# Patient Record
Sex: Female | Born: 1945
Health system: Southern US, Community
[De-identification: ages and names within clinical notes are randomized; demographics above are authoritative.]

## PROBLEM LIST (undated history)

## (undated) DIAGNOSIS — R351 Nocturia: Secondary | ICD-10-CM

## (undated) DIAGNOSIS — E119 Type 2 diabetes mellitus without complications: Secondary | ICD-10-CM

## (undated) DIAGNOSIS — I509 Heart failure, unspecified: Secondary | ICD-10-CM

## (undated) DIAGNOSIS — R51 Headache: Secondary | ICD-10-CM

## (undated) DIAGNOSIS — I83813 Varicose veins of bilateral lower extremities with pain: Secondary | ICD-10-CM

## (undated) DIAGNOSIS — R233 Spontaneous ecchymoses: Secondary | ICD-10-CM

## (undated) DIAGNOSIS — R238 Other skin changes: Secondary | ICD-10-CM

## (undated) DIAGNOSIS — U071 COVID-19: Secondary | ICD-10-CM

## (undated) DIAGNOSIS — E662 Morbid (severe) obesity with alveolar hypoventilation: Secondary | ICD-10-CM

## (undated) DIAGNOSIS — R945 Abnormal results of liver function studies: Secondary | ICD-10-CM

## (undated) DIAGNOSIS — J449 Chronic obstructive pulmonary disease, unspecified: Secondary | ICD-10-CM

## (undated) DIAGNOSIS — M1990 Unspecified osteoarthritis, unspecified site: Secondary | ICD-10-CM

## (undated) DIAGNOSIS — K859 Acute pancreatitis without necrosis or infection, unspecified: Secondary | ICD-10-CM

## (undated) DIAGNOSIS — E785 Hyperlipidemia, unspecified: Secondary | ICD-10-CM

## (undated) DIAGNOSIS — G8929 Other chronic pain: Secondary | ICD-10-CM

## (undated) DIAGNOSIS — Z860100 Personal history of colon polyps, unspecified: Secondary | ICD-10-CM

## (undated) DIAGNOSIS — N189 Chronic kidney disease, unspecified: Secondary | ICD-10-CM

## (undated) DIAGNOSIS — M549 Dorsalgia, unspecified: Secondary | ICD-10-CM

## (undated) DIAGNOSIS — Z9289 Personal history of other medical treatment: Secondary | ICD-10-CM

## (undated) DIAGNOSIS — G4733 Obstructive sleep apnea (adult) (pediatric): Secondary | ICD-10-CM

## (undated) DIAGNOSIS — J189 Pneumonia, unspecified organism: Secondary | ICD-10-CM

## (undated) DIAGNOSIS — K5790 Diverticulosis of intestine, part unspecified, without perforation or abscess without bleeding: Secondary | ICD-10-CM

## (undated) DIAGNOSIS — G20A1 Parkinson's disease without dyskinesia, without mention of fluctuations: Secondary | ICD-10-CM

## (undated) DIAGNOSIS — R569 Unspecified convulsions: Secondary | ICD-10-CM

## (undated) DIAGNOSIS — R3915 Urgency of urination: Secondary | ICD-10-CM

## (undated) DIAGNOSIS — G629 Polyneuropathy, unspecified: Secondary | ICD-10-CM

## (undated) DIAGNOSIS — E236 Other disorders of pituitary gland: Secondary | ICD-10-CM

## (undated) DIAGNOSIS — R053 Chronic cough: Secondary | ICD-10-CM

## (undated) DIAGNOSIS — F32A Depression, unspecified: Secondary | ICD-10-CM

## (undated) DIAGNOSIS — R05 Cough: Secondary | ICD-10-CM

## (undated) DIAGNOSIS — M2540 Effusion, unspecified joint: Secondary | ICD-10-CM

## (undated) DIAGNOSIS — F329 Major depressive disorder, single episode, unspecified: Secondary | ICD-10-CM

## (undated) DIAGNOSIS — I219 Acute myocardial infarction, unspecified: Secondary | ICD-10-CM

## (undated) DIAGNOSIS — H269 Unspecified cataract: Secondary | ICD-10-CM

## (undated) DIAGNOSIS — H409 Unspecified glaucoma: Secondary | ICD-10-CM

## (undated) DIAGNOSIS — I10 Essential (primary) hypertension: Secondary | ICD-10-CM

## (undated) DIAGNOSIS — D649 Anemia, unspecified: Secondary | ICD-10-CM

## (undated) DIAGNOSIS — G20 Parkinson's disease: Secondary | ICD-10-CM

## (undated) HISTORY — PX: ABDOMINAL HYSTERECTOMY: SHX81

## (undated) HISTORY — DX: Cough: R05

## (undated) HISTORY — DX: Hyperlipidemia, unspecified: E78.5

## (undated) HISTORY — DX: Abnormal results of liver function studies: R94.5

## (undated) HISTORY — DX: Varicose veins of bilateral lower extremities with pain: I83.813

## (undated) HISTORY — PX: APPENDECTOMY: SHX54

## (undated) HISTORY — DX: Chronic cough: R05.3

## (undated) HISTORY — PX: TONSILLECTOMY: SUR1361

## (undated) HISTORY — DX: Essential (primary) hypertension: I10

## (undated) HISTORY — PX: BACK SURGERY: SHX140

## (undated) HISTORY — PX: TUBAL LIGATION: SHX77

## (undated) HISTORY — DX: Headache: R51

## (undated) HISTORY — DX: Other disorders of pituitary gland: E23.6

## (undated) HISTORY — DX: Acute myocardial infarction, unspecified: I21.9

## (undated) HISTORY — PX: NM MYOVIEW LTD: HXRAD82

## (undated) HISTORY — DX: Morbid (severe) obesity with alveolar hypoventilation: E66.2

## (undated) HISTORY — PX: OTHER SURGICAL HISTORY: SHX169

## (undated) HISTORY — PX: RECTAL POLYPECTOMY: SHX2309

## (undated) HISTORY — PX: ESOPHAGOGASTRODUODENOSCOPY: SHX1529

## (undated) HISTORY — DX: Pneumonia, unspecified organism: J18.9

## (undated) HISTORY — DX: Type 2 diabetes mellitus without complications: E11.9

## (undated) HISTORY — PX: TRANSTHORACIC ECHOCARDIOGRAM: SHX275

## (undated) HISTORY — PX: LEFT HEART CATH AND CORONARY ANGIOGRAPHY: CATH118249

## (undated) HISTORY — DX: Obstructive sleep apnea (adult) (pediatric): G47.33

## (undated) HISTORY — PX: CHOLECYSTECTOMY: SHX55

## (undated) HISTORY — PX: CARDIAC CATHETERIZATION: SHX172

## (undated) HISTORY — PX: LASIK: SHX215

---

## 1998-06-23 ENCOUNTER — Ambulatory Visit (HOSPITAL_COMMUNITY): Admission: RE | Admit: 1998-06-23 | Discharge: 1998-06-23 | Payer: Self-pay | Admitting: Nephrology

## 1998-06-23 ENCOUNTER — Encounter: Payer: Self-pay | Admitting: Nephrology

## 1998-08-05 ENCOUNTER — Encounter: Payer: Self-pay | Admitting: Emergency Medicine

## 1998-08-05 ENCOUNTER — Emergency Department (HOSPITAL_COMMUNITY): Admission: EM | Admit: 1998-08-05 | Discharge: 1998-08-05 | Payer: Self-pay | Admitting: Emergency Medicine

## 1998-09-12 ENCOUNTER — Encounter: Admission: RE | Admit: 1998-09-12 | Discharge: 1998-10-18 | Payer: Self-pay | Admitting: *Deleted

## 1999-05-08 ENCOUNTER — Encounter: Admission: RE | Admit: 1999-05-08 | Discharge: 1999-05-08 | Payer: Self-pay | Admitting: Orthopedic Surgery

## 1999-05-08 ENCOUNTER — Encounter: Payer: Self-pay | Admitting: Orthopedic Surgery

## 1999-08-15 ENCOUNTER — Encounter: Admission: RE | Admit: 1999-08-15 | Discharge: 1999-10-12 | Payer: Self-pay | Admitting: *Deleted

## 1999-09-14 ENCOUNTER — Encounter: Admission: RE | Admit: 1999-09-14 | Discharge: 1999-09-14 | Payer: Self-pay | Admitting: Nephrology

## 1999-09-14 ENCOUNTER — Encounter: Payer: Self-pay | Admitting: Nephrology

## 1999-09-15 ENCOUNTER — Encounter: Payer: Self-pay | Admitting: Nephrology

## 1999-09-15 ENCOUNTER — Encounter: Admission: RE | Admit: 1999-09-15 | Discharge: 1999-09-15 | Payer: Self-pay | Admitting: Nephrology

## 1999-09-21 ENCOUNTER — Encounter: Payer: Self-pay | Admitting: Nephrology

## 1999-09-21 ENCOUNTER — Encounter: Admission: RE | Admit: 1999-09-21 | Discharge: 1999-09-21 | Payer: Self-pay | Admitting: Nephrology

## 1999-11-24 ENCOUNTER — Emergency Department (HOSPITAL_COMMUNITY): Admission: EM | Admit: 1999-11-24 | Discharge: 1999-11-24 | Payer: Self-pay | Admitting: Emergency Medicine

## 1999-11-24 ENCOUNTER — Encounter: Payer: Self-pay | Admitting: Emergency Medicine

## 2000-11-08 ENCOUNTER — Encounter: Admission: RE | Admit: 2000-11-08 | Discharge: 2000-11-08 | Payer: Self-pay | Admitting: General Surgery

## 2000-11-08 ENCOUNTER — Encounter (HOSPITAL_BASED_OUTPATIENT_CLINIC_OR_DEPARTMENT_OTHER): Payer: Self-pay | Admitting: General Surgery

## 2001-06-16 ENCOUNTER — Other Ambulatory Visit: Admission: RE | Admit: 2001-06-16 | Discharge: 2001-06-16 | Payer: Self-pay | Admitting: Nephrology

## 2002-09-14 ENCOUNTER — Encounter (HOSPITAL_BASED_OUTPATIENT_CLINIC_OR_DEPARTMENT_OTHER): Payer: Self-pay | Admitting: General Surgery

## 2002-09-14 ENCOUNTER — Ambulatory Visit (HOSPITAL_COMMUNITY): Admission: RE | Admit: 2002-09-14 | Discharge: 2002-09-14 | Payer: Self-pay | Admitting: General Surgery

## 2002-10-23 ENCOUNTER — Encounter: Admission: RE | Admit: 2002-10-23 | Discharge: 2002-10-23 | Payer: Self-pay | Admitting: Nephrology

## 2002-10-23 ENCOUNTER — Encounter: Payer: Self-pay | Admitting: Nephrology

## 2003-05-16 ENCOUNTER — Emergency Department (HOSPITAL_COMMUNITY): Admission: EM | Admit: 2003-05-16 | Discharge: 2003-05-16 | Payer: Self-pay

## 2004-07-09 ENCOUNTER — Emergency Department (HOSPITAL_COMMUNITY): Admission: EM | Admit: 2004-07-09 | Discharge: 2004-07-09 | Payer: Self-pay | Admitting: Emergency Medicine

## 2006-02-18 ENCOUNTER — Inpatient Hospital Stay (HOSPITAL_COMMUNITY): Admission: EM | Admit: 2006-02-18 | Discharge: 2006-02-20 | Payer: Self-pay | Admitting: Emergency Medicine

## 2007-07-09 ENCOUNTER — Observation Stay (HOSPITAL_COMMUNITY): Admission: EM | Admit: 2007-07-09 | Discharge: 2007-07-11 | Payer: Self-pay | Admitting: Emergency Medicine

## 2007-09-22 ENCOUNTER — Emergency Department (HOSPITAL_COMMUNITY): Admission: EM | Admit: 2007-09-22 | Discharge: 2007-09-22 | Payer: Self-pay | Admitting: Emergency Medicine

## 2007-11-27 ENCOUNTER — Inpatient Hospital Stay (HOSPITAL_COMMUNITY): Admission: EM | Admit: 2007-11-27 | Discharge: 2007-11-29 | Payer: Self-pay | Admitting: Emergency Medicine

## 2008-02-20 ENCOUNTER — Emergency Department (HOSPITAL_COMMUNITY): Admission: EM | Admit: 2008-02-20 | Discharge: 2008-02-20 | Payer: Self-pay | Admitting: Emergency Medicine

## 2008-04-02 ENCOUNTER — Emergency Department (HOSPITAL_COMMUNITY): Admission: EM | Admit: 2008-04-02 | Discharge: 2008-04-03 | Payer: Self-pay | Admitting: Emergency Medicine

## 2008-04-20 ENCOUNTER — Emergency Department (HOSPITAL_COMMUNITY): Admission: EM | Admit: 2008-04-20 | Discharge: 2008-04-20 | Payer: Self-pay | Admitting: Emergency Medicine

## 2008-05-04 ENCOUNTER — Inpatient Hospital Stay (HOSPITAL_COMMUNITY): Admission: EM | Admit: 2008-05-04 | Discharge: 2008-05-12 | Payer: Self-pay | Admitting: Emergency Medicine

## 2008-05-04 DIAGNOSIS — R110 Nausea: Secondary | ICD-10-CM | POA: Insufficient documentation

## 2008-05-04 DIAGNOSIS — R42 Dizziness and giddiness: Secondary | ICD-10-CM | POA: Insufficient documentation

## 2008-05-04 DIAGNOSIS — R413 Other amnesia: Secondary | ICD-10-CM | POA: Insufficient documentation

## 2008-07-11 ENCOUNTER — Emergency Department (HOSPITAL_COMMUNITY): Admission: EM | Admit: 2008-07-11 | Discharge: 2008-07-12 | Payer: Self-pay | Admitting: Emergency Medicine

## 2008-12-25 ENCOUNTER — Emergency Department (HOSPITAL_COMMUNITY): Admission: EM | Admit: 2008-12-25 | Discharge: 2008-12-26 | Payer: Self-pay | Admitting: Emergency Medicine

## 2009-03-04 ENCOUNTER — Emergency Department (HOSPITAL_COMMUNITY): Admission: EM | Admit: 2009-03-04 | Discharge: 2009-03-04 | Payer: Self-pay | Admitting: Emergency Medicine

## 2009-04-23 ENCOUNTER — Emergency Department (HOSPITAL_COMMUNITY): Admission: EM | Admit: 2009-04-23 | Discharge: 2009-04-23 | Payer: Self-pay | Admitting: Emergency Medicine

## 2009-05-16 ENCOUNTER — Ambulatory Visit (HOSPITAL_COMMUNITY): Admission: RE | Admit: 2009-05-16 | Discharge: 2009-05-16 | Payer: Self-pay | Admitting: Family Medicine

## 2009-05-20 ENCOUNTER — Inpatient Hospital Stay (HOSPITAL_COMMUNITY): Admission: EM | Admit: 2009-05-20 | Discharge: 2009-05-24 | Payer: Self-pay | Admitting: Emergency Medicine

## 2009-05-21 ENCOUNTER — Encounter: Payer: Self-pay | Admitting: Internal Medicine

## 2009-05-21 ENCOUNTER — Ambulatory Visit: Payer: Self-pay | Admitting: Internal Medicine

## 2009-05-23 ENCOUNTER — Encounter (INDEPENDENT_AMBULATORY_CARE_PROVIDER_SITE_OTHER): Payer: Self-pay | Admitting: Neurology

## 2009-05-23 ENCOUNTER — Ambulatory Visit: Payer: Self-pay | Admitting: Vascular Surgery

## 2009-05-24 ENCOUNTER — Ambulatory Visit: Payer: Self-pay | Admitting: Infectious Diseases

## 2009-06-30 ENCOUNTER — Inpatient Hospital Stay (HOSPITAL_COMMUNITY): Admission: EM | Admit: 2009-06-30 | Discharge: 2009-07-05 | Payer: Self-pay | Admitting: Emergency Medicine

## 2009-09-29 ENCOUNTER — Encounter: Payer: Self-pay | Admitting: Cardiovascular Disease

## 2009-12-21 ENCOUNTER — Encounter: Payer: Self-pay | Admitting: Infectious Diseases

## 2009-12-28 ENCOUNTER — Emergency Department (HOSPITAL_COMMUNITY): Admission: EM | Admit: 2009-12-28 | Discharge: 2009-12-29 | Payer: Self-pay | Admitting: Emergency Medicine

## 2010-06-25 DIAGNOSIS — J189 Pneumonia, unspecified organism: Secondary | ICD-10-CM

## 2010-06-25 DIAGNOSIS — I219 Acute myocardial infarction, unspecified: Secondary | ICD-10-CM

## 2010-06-25 HISTORY — DX: Acute myocardial infarction, unspecified: I21.9

## 2010-06-25 HISTORY — DX: Pneumonia, unspecified organism: J18.9

## 2010-07-27 NOTE — Miscellaneous (Signed)
Summary: [facility]: CMN  [facility]: CMN   Imported By: Florinda Marker 12/21/2009 16:52:48  _____________________________________________________________________  External Attachment:    Type:   Image     Comment:   External Document

## 2010-08-13 ENCOUNTER — Emergency Department (HOSPITAL_COMMUNITY): Payer: Medicare HMO

## 2010-08-13 ENCOUNTER — Inpatient Hospital Stay (HOSPITAL_COMMUNITY)
Admission: EM | Admit: 2010-08-13 | Discharge: 2010-09-08 | DRG: 417 | Disposition: A | Discharge status: Home Health | Payer: Medicare HMO | Attending: Internal Medicine | Admitting: Internal Medicine

## 2010-08-13 DIAGNOSIS — I129 Hypertensive chronic kidney disease with stage 1 through stage 4 chronic kidney disease, or unspecified chronic kidney disease: Secondary | ICD-10-CM | POA: Diagnosis present

## 2010-08-13 DIAGNOSIS — T380X5A Adverse effect of glucocorticoids and synthetic analogues, initial encounter: Secondary | ICD-10-CM | POA: Diagnosis present

## 2010-08-13 DIAGNOSIS — G8929 Other chronic pain: Secondary | ICD-10-CM | POA: Diagnosis present

## 2010-08-13 DIAGNOSIS — G4733 Obstructive sleep apnea (adult) (pediatric): Secondary | ICD-10-CM | POA: Diagnosis present

## 2010-08-13 DIAGNOSIS — N182 Chronic kidney disease, stage 2 (mild): Secondary | ICD-10-CM | POA: Diagnosis present

## 2010-08-13 DIAGNOSIS — E119 Type 2 diabetes mellitus without complications: Secondary | ICD-10-CM | POA: Diagnosis present

## 2010-08-13 DIAGNOSIS — Z7982 Long term (current) use of aspirin: Secondary | ICD-10-CM

## 2010-08-13 DIAGNOSIS — I509 Heart failure, unspecified: Secondary | ICD-10-CM | POA: Diagnosis not present

## 2010-08-13 DIAGNOSIS — F3289 Other specified depressive episodes: Secondary | ICD-10-CM | POA: Diagnosis present

## 2010-08-13 DIAGNOSIS — Z79899 Other long term (current) drug therapy: Secondary | ICD-10-CM

## 2010-08-13 DIAGNOSIS — K859 Acute pancreatitis without necrosis or infection, unspecified: Principal | ICD-10-CM | POA: Diagnosis present

## 2010-08-13 DIAGNOSIS — Z880 Allergy status to penicillin: Secondary | ICD-10-CM

## 2010-08-13 DIAGNOSIS — G40909 Epilepsy, unspecified, not intractable, without status epilepticus: Secondary | ICD-10-CM | POA: Diagnosis present

## 2010-08-13 DIAGNOSIS — J189 Pneumonia, unspecified organism: Secondary | ICD-10-CM | POA: Diagnosis not present

## 2010-08-13 DIAGNOSIS — K8050 Calculus of bile duct without cholangitis or cholecystitis without obstruction: Secondary | ICD-10-CM | POA: Diagnosis present

## 2010-08-13 DIAGNOSIS — K5730 Diverticulosis of large intestine without perforation or abscess without bleeding: Secondary | ICD-10-CM | POA: Diagnosis present

## 2010-08-13 DIAGNOSIS — I214 Non-ST elevation (NSTEMI) myocardial infarction: Secondary | ICD-10-CM | POA: Diagnosis present

## 2010-08-13 DIAGNOSIS — J9620 Acute and chronic respiratory failure, unspecified whether with hypoxia or hypercapnia: Secondary | ICD-10-CM | POA: Diagnosis not present

## 2010-08-13 DIAGNOSIS — E785 Hyperlipidemia, unspecified: Secondary | ICD-10-CM | POA: Diagnosis present

## 2010-08-13 DIAGNOSIS — I5033 Acute on chronic diastolic (congestive) heart failure: Secondary | ICD-10-CM | POA: Diagnosis not present

## 2010-08-13 DIAGNOSIS — F329 Major depressive disorder, single episode, unspecified: Secondary | ICD-10-CM | POA: Diagnosis present

## 2010-08-13 DIAGNOSIS — D72829 Elevated white blood cell count, unspecified: Secondary | ICD-10-CM | POA: Diagnosis present

## 2010-08-13 DIAGNOSIS — K648 Other hemorrhoids: Secondary | ICD-10-CM | POA: Diagnosis not present

## 2010-08-13 DIAGNOSIS — D649 Anemia, unspecified: Secondary | ICD-10-CM | POA: Diagnosis not present

## 2010-08-13 DIAGNOSIS — M549 Dorsalgia, unspecified: Secondary | ICD-10-CM | POA: Diagnosis present

## 2010-08-13 DIAGNOSIS — Z8249 Family history of ischemic heart disease and other diseases of the circulatory system: Secondary | ICD-10-CM

## 2010-08-13 LAB — CBC
HCT: 47.9 % — ABNORMAL HIGH (ref 36.0–46.0)
Hemoglobin: 15.2 g/dL — ABNORMAL HIGH (ref 12.0–15.0)
MCH: 23.5 pg — ABNORMAL LOW (ref 26.0–34.0)
MCHC: 31.7 g/dL (ref 30.0–36.0)
MCV: 73.9 fL — ABNORMAL LOW (ref 78.0–100.0)
Platelets: 252 10*3/uL (ref 150–400)
RBC: 6.48 MIL/uL — ABNORMAL HIGH (ref 3.87–5.11)
RDW: 16.6 % — ABNORMAL HIGH (ref 11.5–15.5)
WBC: 13.9 10*3/uL — ABNORMAL HIGH (ref 4.0–10.5)

## 2010-08-13 LAB — CARDIAC PANEL(CRET KIN+CKTOT+MB+TROPI)
CK, MB: 17.1 ng/mL (ref 0.3–4.0)
CK, MB: 19.1 ng/mL (ref 0.3–4.0)
Relative Index: 7.8 — ABNORMAL HIGH (ref 0.0–2.5)
Relative Index: 7.7 — ABNORMAL HIGH (ref 0.0–2.5)
Total CK: 220 U/L — ABNORMAL HIGH (ref 7–177)
Total CK: 247 U/L — ABNORMAL HIGH (ref 7–177)
Troponin I: 0.7 ng/mL (ref 0.00–0.06)
Troponin I: 0.78 ng/mL (ref 0.00–0.06)

## 2010-08-13 LAB — COMPREHENSIVE METABOLIC PANEL
ALT: 308 U/L — ABNORMAL HIGH (ref 0–35)
AST: 534 U/L — ABNORMAL HIGH (ref 0–37)
Albumin: 4.1 g/dL (ref 3.5–5.2)
Alkaline Phosphatase: 204 U/L — ABNORMAL HIGH (ref 39–117)
BUN: 14 mg/dL (ref 6–23)
CO2: 30 mEq/L (ref 19–32)
Calcium: 9.5 mg/dL (ref 8.4–10.5)
Chloride: 100 mEq/L (ref 96–112)
Creatinine, Ser: 1.1 mg/dL (ref 0.4–1.2)
GFR calc Af Amer: >60 mL/min (ref >60)
GFR calc non Af Amer: 50 mL/min — ABNORMAL LOW (ref >60)
Glucose, Bld: 266 mg/dL — ABNORMAL HIGH (ref 70–99)
Potassium: 4.4 mEq/L (ref 3.5–5.1)
Sodium: 139 mEq/L (ref 135–145)
Total Bilirubin: 1.4 mg/dL — ABNORMAL HIGH (ref 0.3–1.2)
Total Protein: 8.7 g/dL — ABNORMAL HIGH (ref 6.0–8.3)

## 2010-08-13 LAB — IRON AND TIBC
Iron: 21 ug/dL — ABNORMAL LOW (ref 42–135)
Saturation Ratios: 7 % — ABNORMAL LOW (ref 20–55)
TIBC: 315 ug/dL (ref 250–470)
UIBC: 294 ug/dL

## 2010-08-13 LAB — URINALYSIS, ROUTINE W REFLEX MICROSCOPIC
Hgb urine dipstick: NEGATIVE
Ketones, ur: NEGATIVE mg/dL
Nitrite: NEGATIVE
Protein, ur: NEGATIVE mg/dL
Specific Gravity, Urine: 1.013 (ref 1.005–1.030)
Urine Glucose, Fasting: NEGATIVE mg/dL
Urobilinogen, UA: 1 mg/dL (ref 0.0–1.0)
pH: 6 (ref 5.0–8.0)

## 2010-08-13 LAB — ACETAMINOPHEN LEVEL: Acetaminophen (Tylenol), Serum: <10 ug/mL — ABNORMAL LOW (ref 10–30)

## 2010-08-13 LAB — ALPHA-1-ANTITRYPSIN: A-1 Antitrypsin, Ser: 210 mg/dL — ABNORMAL HIGH (ref 83–200)

## 2010-08-13 LAB — CK TOTAL AND CKMB (NOT AT ARMC)
CK, MB: 11.8 ng/mL (ref 0.3–4.0)
Relative Index: 6.8 — ABNORMAL HIGH (ref 0.0–2.5)
Total CK: 174 U/L (ref 7–177)

## 2010-08-13 LAB — GLUCOSE, CAPILLARY
Glucose-Capillary: 253 mg/dL — ABNORMAL HIGH (ref 70–99)
Glucose-Capillary: 219 mg/dL — ABNORMAL HIGH (ref 70–99)
Glucose-Capillary: 168 mg/dL — ABNORMAL HIGH (ref 70–99)

## 2010-08-13 LAB — DIFFERENTIAL
Basophils Absolute: 0 10*3/uL (ref 0.0–0.1)
Basophils Relative: 0 % (ref 0–1)
Eosinophils Absolute: 0 10*3/uL (ref 0.0–0.7)
Eosinophils Relative: 0 % (ref 0–5)
Lymphocytes Relative: 6 % — ABNORMAL LOW (ref 12–46)
Lymphs Abs: 0.8 10*3/uL (ref 0.7–4.0)
Monocytes Absolute: 0.4 10*3/uL (ref 0.1–1.0)
Monocytes Relative: 3 % (ref 3–12)
Neutro Abs: 12.7 10*3/uL — ABNORMAL HIGH (ref 1.7–7.7)
Neutrophils Relative %: 92 % — ABNORMAL HIGH (ref 43–77)

## 2010-08-13 LAB — SALICYLATE LEVEL: Salicylate Lvl: <4 mg/dL (ref 2.8–20.0)

## 2010-08-13 LAB — APTT: aPTT: 33 seconds (ref 24–37)

## 2010-08-13 LAB — HEPATITIS PANEL, ACUTE
HCV Ab: NEGATIVE
Hep A IgM: NEGATIVE
Hep B C IgM: NEGATIVE
Hepatitis B Surface Ag: NEGATIVE

## 2010-08-13 LAB — CERULOPLASMIN: Ceruloplasmin: 60 mg/dL (ref 21–63)

## 2010-08-13 LAB — CORTISOL: Cortisol, Plasma: 76 ug/dL

## 2010-08-13 LAB — PROTIME-INR
INR: 1.12 (ref 0.00–1.49)
Prothrombin Time: 14.6 seconds (ref 11.6–15.2)

## 2010-08-13 LAB — LIPASE, BLOOD: Lipase: 1995 U/L — ABNORMAL HIGH (ref 11–59)

## 2010-08-13 LAB — TROPONIN I: Troponin I: 0.22 ng/mL — ABNORMAL HIGH (ref 0.00–0.06)

## 2010-08-13 LAB — TSH: TSH: 0.768 u[IU]/mL (ref 0.350–4.500)

## 2010-08-13 LAB — FERRITIN: Ferritin: 366 ng/mL — ABNORMAL HIGH (ref 10–291)

## 2010-08-13 MED ORDER — IOHEXOL 300 MG/ML  SOLN
125.0000 mL | Freq: Once | INTRAMUSCULAR | Status: AC | PRN
  Administered 2010-08-13: 125 mL via INTRAVENOUS

## 2010-08-14 ENCOUNTER — Inpatient Hospital Stay (HOSPITAL_COMMUNITY): Payer: Medicare HMO

## 2010-08-14 DIAGNOSIS — R945 Abnormal results of liver function studies: Secondary | ICD-10-CM

## 2010-08-14 DIAGNOSIS — I369 Nonrheumatic tricuspid valve disorder, unspecified: Secondary | ICD-10-CM

## 2010-08-14 DIAGNOSIS — I214 Non-ST elevation (NSTEMI) myocardial infarction: Secondary | ICD-10-CM

## 2010-08-14 DIAGNOSIS — K859 Acute pancreatitis without necrosis or infection, unspecified: Secondary | ICD-10-CM

## 2010-08-14 DIAGNOSIS — K8020 Calculus of gallbladder without cholecystitis without obstruction: Secondary | ICD-10-CM

## 2010-08-14 LAB — CBC
HCT: 42.2 % (ref 36.0–46.0)
Hemoglobin: 12.9 g/dL (ref 12.0–15.0)
MCH: 22.7 pg — ABNORMAL LOW (ref 26.0–34.0)
MCHC: 30.6 g/dL (ref 30.0–36.0)
MCV: 74.3 fL — ABNORMAL LOW (ref 78.0–100.0)
Platelets: 229 10*3/uL (ref 150–400)
RBC: 5.68 MIL/uL — ABNORMAL HIGH (ref 3.87–5.11)
RDW: 16.8 % — ABNORMAL HIGH (ref 11.5–15.5)
WBC: 19.6 10*3/uL — ABNORMAL HIGH (ref 4.0–10.5)

## 2010-08-14 LAB — BLOOD GAS, ARTERIAL
Acid-base deficit: 0 mmol/L (ref 0.0–2.0)
Acid-base deficit: 0.4 mmol/L (ref 0.0–2.0)
Bicarbonate: 27.1 mEq/L — ABNORMAL HIGH (ref 20.0–24.0)
Bicarbonate: 26.7 mEq/L — ABNORMAL HIGH (ref 20.0–24.0)
Drawn by: 30996
Drawn by: 103701
Expiratory PAP: 5
FIO2: 0.6 %
Inspiratory PAP: 13
Mode: POSITIVE
O2 Content: 4 L/min
O2 Saturation: 91.3 %
O2 Saturation: 96.9 %
Patient temperature: 98.6
Patient temperature: 99.8
RATE: 8 resp/min
TCO2: 25.2 mmol/L (ref 0–100)
TCO2: 24.8 mmol/L (ref 0–100)
pCO2 arterial: 58.8 mmHg (ref 35.0–45.0)
pCO2 arterial: 60.1 mmHg (ref 35.0–45.0)
pH, Arterial: 7.286 — ABNORMAL LOW (ref 7.350–7.400)
pH, Arterial: 7.275 — ABNORMAL LOW (ref 7.350–7.400)
pO2, Arterial: 64.1 mmHg — ABNORMAL LOW (ref 80.0–100.0)
pO2, Arterial: 92 mmHg (ref 80.0–100.0)

## 2010-08-14 LAB — DIFFERENTIAL
Basophils Absolute: 0 10*3/uL (ref 0.0–0.1)
Basophils Relative: 0 % (ref 0–1)
Eosinophils Absolute: 0 10*3/uL (ref 0.0–0.7)
Eosinophils Relative: 0 % (ref 0–5)
Lymphocytes Relative: 4 % — ABNORMAL LOW (ref 12–46)
Lymphs Abs: 0.8 10*3/uL (ref 0.7–4.0)
Monocytes Absolute: 1 10*3/uL (ref 0.1–1.0)
Monocytes Relative: 5 % (ref 3–12)
Neutro Abs: 17.8 10*3/uL — ABNORMAL HIGH (ref 1.7–7.7)
Neutrophils Relative %: 91 % — ABNORMAL HIGH (ref 43–77)

## 2010-08-14 LAB — GLUCOSE, CAPILLARY
Glucose-Capillary: 157 mg/dL — ABNORMAL HIGH (ref 70–99)
Glucose-Capillary: 161 mg/dL — ABNORMAL HIGH (ref 70–99)
Glucose-Capillary: 178 mg/dL — ABNORMAL HIGH (ref 70–99)
Glucose-Capillary: 181 mg/dL — ABNORMAL HIGH (ref 70–99)
Glucose-Capillary: 216 mg/dL — ABNORMAL HIGH (ref 70–99)
Glucose-Capillary: 217 mg/dL — ABNORMAL HIGH (ref 70–99)

## 2010-08-14 LAB — LIPASE, BLOOD: Lipase: 1101 U/L — ABNORMAL HIGH (ref 11–59)

## 2010-08-14 LAB — HEMOGLOBIN AND HEMATOCRIT, BLOOD
HCT: 41.2 % (ref 36.0–46.0)
HCT: 39 % (ref 36.0–46.0)
HCT: 38.2 % (ref 36.0–46.0)
Hemoglobin: 12.4 g/dL (ref 12.0–15.0)
Hemoglobin: 12 g/dL (ref 12.0–15.0)
Hemoglobin: 11.6 g/dL — ABNORMAL LOW (ref 12.0–15.0)

## 2010-08-14 LAB — CARDIAC PANEL(CRET KIN+CKTOT+MB+TROPI)
CK, MB: 18.7 ng/mL (ref 0.3–4.0)
Relative Index: 6.5 — ABNORMAL HIGH (ref 0.0–2.5)
Total CK: 287 U/L — ABNORMAL HIGH (ref 7–177)
Troponin I: 0.47 ng/mL — ABNORMAL HIGH (ref 0.00–0.06)

## 2010-08-14 LAB — COMPREHENSIVE METABOLIC PANEL
ALT: 171 U/L — ABNORMAL HIGH (ref 0–35)
AST: 128 U/L — ABNORMAL HIGH (ref 0–37)
Albumin: 3.5 g/dL (ref 3.5–5.2)
Alkaline Phosphatase: 133 U/L — ABNORMAL HIGH (ref 39–117)
BUN: 13 mg/dL (ref 6–23)
CO2: 24 mEq/L (ref 19–32)
Calcium: 8 mg/dL — ABNORMAL LOW (ref 8.4–10.5)
Chloride: 105 mEq/L (ref 96–112)
Creatinine, Ser: 1.05 mg/dL (ref 0.4–1.2)
GFR calc Af Amer: >60 mL/min (ref >60)
GFR calc non Af Amer: 53 mL/min — ABNORMAL LOW (ref >60)
Glucose, Bld: 194 mg/dL — ABNORMAL HIGH (ref 70–99)
Potassium: 4.9 mEq/L (ref 3.5–5.1)
Sodium: 136 mEq/L (ref 135–145)
Total Bilirubin: 0.7 mg/dL (ref 0.3–1.2)
Total Protein: 7.4 g/dL (ref 6.0–8.3)

## 2010-08-14 LAB — BRAIN NATRIURETIC PEPTIDE: Pro B Natriuretic peptide (BNP): 161 pg/mL — ABNORMAL HIGH (ref 0.0–100.0)

## 2010-08-14 LAB — HEPARIN LEVEL (UNFRACTIONATED)
Heparin Unfractionated: 0.21 IU/mL — ABNORMAL LOW (ref 0.30–0.70)
Heparin Unfractionated: 0.28 IU/mL — ABNORMAL LOW (ref 0.30–0.70)

## 2010-08-14 LAB — MRSA PCR SCREENING: MRSA by PCR: NEGATIVE

## 2010-08-14 LAB — ANA: Anti Nuclear Antibody(ANA): NEGATIVE

## 2010-08-15 ENCOUNTER — Inpatient Hospital Stay (HOSPITAL_COMMUNITY): Payer: Medicare HMO

## 2010-08-15 DIAGNOSIS — R945 Abnormal results of liver function studies: Secondary | ICD-10-CM

## 2010-08-15 DIAGNOSIS — K8020 Calculus of gallbladder without cholecystitis without obstruction: Secondary | ICD-10-CM

## 2010-08-15 DIAGNOSIS — K859 Acute pancreatitis without necrosis or infection, unspecified: Secondary | ICD-10-CM

## 2010-08-15 LAB — BLOOD GAS, ARTERIAL
Acid-Base Excess: 1.1 mmol/L (ref 0.0–2.0)
Acid-Base Excess: 0.5 mmol/L (ref 0.0–2.0)
Bicarbonate: 27.9 mEq/L — ABNORMAL HIGH (ref 20.0–24.0)
Bicarbonate: 26.9 mEq/L — ABNORMAL HIGH (ref 20.0–24.0)
Drawn by: 129801
Drawn by: 317871
O2 Content: 4 L/min
O2 Content: 4 L/min
O2 Saturation: 94.5 %
O2 Saturation: 90.9 %
Patient temperature: 98.6
Patient temperature: 98.6
TCO2: 26.2 mmol/L (ref 0–100)
TCO2: 25.3 mmol/L (ref 0–100)
pCO2 arterial: 58.5 mmHg (ref 35.0–45.0)
pCO2 arterial: 55.4 mmHg — ABNORMAL HIGH (ref 35.0–45.0)
pH, Arterial: 7.3 — ABNORMAL LOW (ref 7.350–7.400)
pH, Arterial: 7.307 — ABNORMAL LOW (ref 7.350–7.400)
pO2, Arterial: 74.7 mmHg — ABNORMAL LOW (ref 80.0–100.0)
pO2, Arterial: 62.2 mmHg — ABNORMAL LOW (ref 80.0–100.0)

## 2010-08-15 LAB — COMPREHENSIVE METABOLIC PANEL
ALT: 105 U/L — ABNORMAL HIGH (ref 0–35)
AST: 64 U/L — ABNORMAL HIGH (ref 0–37)
Albumin: 2.9 g/dL — ABNORMAL LOW (ref 3.5–5.2)
Alkaline Phosphatase: 97 U/L (ref 39–117)
BUN: 15 mg/dL (ref 6–23)
CO2: 27 mEq/L (ref 19–32)
Calcium: 7.4 mg/dL — ABNORMAL LOW (ref 8.4–10.5)
Chloride: 107 mEq/L (ref 96–112)
Creatinine, Ser: 1.1 mg/dL (ref 0.4–1.2)
GFR calc Af Amer: >60 mL/min (ref >60)
GFR calc non Af Amer: 50 mL/min — ABNORMAL LOW (ref >60)
Glucose, Bld: 199 mg/dL — ABNORMAL HIGH (ref 70–99)
Potassium: 4.7 mEq/L (ref 3.5–5.1)
Sodium: 139 mEq/L (ref 135–145)
Total Bilirubin: 0.8 mg/dL (ref 0.3–1.2)
Total Protein: 6.4 g/dL (ref 6.0–8.3)

## 2010-08-15 LAB — GLUCOSE, CAPILLARY
Glucose-Capillary: 204 mg/dL — ABNORMAL HIGH (ref 70–99)
Glucose-Capillary: 217 mg/dL — ABNORMAL HIGH (ref 70–99)
Glucose-Capillary: 165 mg/dL — ABNORMAL HIGH (ref 70–99)
Glucose-Capillary: 167 mg/dL — ABNORMAL HIGH (ref 70–99)
Glucose-Capillary: 147 mg/dL — ABNORMAL HIGH (ref 70–99)
Glucose-Capillary: 165 mg/dL — ABNORMAL HIGH (ref 70–99)

## 2010-08-15 LAB — CBC
HCT: 37.3 % (ref 36.0–46.0)
Hemoglobin: 11.5 g/dL — ABNORMAL LOW (ref 12.0–15.0)
MCH: 23.3 pg — ABNORMAL LOW (ref 26.0–34.0)
MCHC: 30.8 g/dL (ref 30.0–36.0)
MCV: 75.7 fL — ABNORMAL LOW (ref 78.0–100.0)
Platelets: 211 10*3/uL (ref 150–400)
RBC: 4.93 MIL/uL (ref 3.87–5.11)
RDW: 17.2 % — ABNORMAL HIGH (ref 11.5–15.5)
WBC: 18.7 10*3/uL — ABNORMAL HIGH (ref 4.0–10.5)

## 2010-08-15 LAB — MITOCHONDRIAL ANTIBODIES: Mitochondrial M2 Ab, IgG: 0.49 (ref <0.91)

## 2010-08-16 ENCOUNTER — Inpatient Hospital Stay (HOSPITAL_COMMUNITY): Payer: Medicare HMO

## 2010-08-16 DIAGNOSIS — K8020 Calculus of gallbladder without cholecystitis without obstruction: Secondary | ICD-10-CM

## 2010-08-16 DIAGNOSIS — J9600 Acute respiratory failure, unspecified whether with hypoxia or hypercapnia: Secondary | ICD-10-CM

## 2010-08-16 DIAGNOSIS — R945 Abnormal results of liver function studies: Secondary | ICD-10-CM

## 2010-08-16 DIAGNOSIS — K859 Acute pancreatitis without necrosis or infection, unspecified: Secondary | ICD-10-CM

## 2010-08-16 LAB — CBC
HCT: 34.7 % — ABNORMAL LOW (ref 36.0–46.0)
Hemoglobin: 10.5 g/dL — ABNORMAL LOW (ref 12.0–15.0)
MCH: 22.8 pg — ABNORMAL LOW (ref 26.0–34.0)
MCHC: 30.3 g/dL (ref 30.0–36.0)
MCV: 75.4 fL — ABNORMAL LOW (ref 78.0–100.0)
Platelets: 232 10*3/uL (ref 150–400)
RBC: 4.6 MIL/uL (ref 3.87–5.11)
RDW: 17.2 % — ABNORMAL HIGH (ref 11.5–15.5)
WBC: 15.9 10*3/uL — ABNORMAL HIGH (ref 4.0–10.5)

## 2010-08-16 LAB — GLUCOSE, CAPILLARY
Glucose-Capillary: 197 mg/dL — ABNORMAL HIGH (ref 70–99)
Glucose-Capillary: 211 mg/dL — ABNORMAL HIGH (ref 70–99)
Glucose-Capillary: 177 mg/dL — ABNORMAL HIGH (ref 70–99)
Glucose-Capillary: 171 mg/dL — ABNORMAL HIGH (ref 70–99)
Glucose-Capillary: 189 mg/dL — ABNORMAL HIGH (ref 70–99)

## 2010-08-16 LAB — BLOOD GAS, ARTERIAL
Acid-Base Excess: 2.5 mmol/L — ABNORMAL HIGH (ref 0.0–2.0)
Acid-Base Excess: 6 mmol/L — ABNORMAL HIGH (ref 0.0–2.0)
Bicarbonate: 28.6 mEq/L — ABNORMAL HIGH (ref 20.0–24.0)
Bicarbonate: 33.2 mEq/L — ABNORMAL HIGH (ref 20.0–24.0)
Drawn by: 295031
Drawn by: 257701
Expiratory PAP: 5
FIO2: 0.35 %
FIO2: 0.4 %
Inspiratory PAP: 8
Mode: POSITIVE
O2 Saturation: 94.6 %
O2 Saturation: 92 %
PEEP: 5 cmH2O
Patient temperature: 98.6
Patient temperature: 98.6
Pressure support: 8 cmH2O
RATE: 26 resp/min
TCO2: 26.6 mmol/L (ref 0–100)
TCO2: 30.5 mmol/L (ref 0–100)
pCO2 arterial: 54.7 mmHg — ABNORMAL HIGH (ref 35.0–45.0)
pCO2 arterial: 63.9 mmHg (ref 35.0–45.0)
pH, Arterial: 7.339 — ABNORMAL LOW (ref 7.350–7.400)
pH, Arterial: 7.335 — ABNORMAL LOW (ref 7.350–7.400)
pO2, Arterial: 71.3 mmHg — ABNORMAL LOW (ref 80.0–100.0)
pO2, Arterial: 68.3 mmHg — ABNORMAL LOW (ref 80.0–100.0)

## 2010-08-16 LAB — LIPASE, BLOOD: Lipase: 53 U/L (ref 11–59)

## 2010-08-16 LAB — COMPREHENSIVE METABOLIC PANEL
ALT: 77 U/L — ABNORMAL HIGH (ref 0–35)
AST: 31 U/L (ref 0–37)
Albumin: 2.8 g/dL — ABNORMAL LOW (ref 3.5–5.2)
Alkaline Phosphatase: 83 U/L (ref 39–117)
BUN: 11 mg/dL (ref 6–23)
CO2: 29 mEq/L (ref 19–32)
Calcium: 7.5 mg/dL — ABNORMAL LOW (ref 8.4–10.5)
Chloride: 106 mEq/L (ref 96–112)
Creatinine, Ser: 1.03 mg/dL (ref 0.4–1.2)
GFR calc Af Amer: >60 mL/min (ref >60)
GFR calc non Af Amer: 54 mL/min — ABNORMAL LOW (ref >60)
Glucose, Bld: 205 mg/dL — ABNORMAL HIGH (ref 70–99)
Potassium: 3.8 mEq/L (ref 3.5–5.1)
Sodium: 141 mEq/L (ref 135–145)
Total Bilirubin: 1 mg/dL (ref 0.3–1.2)
Total Protein: 6.5 g/dL (ref 6.0–8.3)

## 2010-08-17 ENCOUNTER — Inpatient Hospital Stay (HOSPITAL_COMMUNITY): Payer: Medicare HMO

## 2010-08-17 DIAGNOSIS — K8020 Calculus of gallbladder without cholecystitis without obstruction: Secondary | ICD-10-CM

## 2010-08-17 DIAGNOSIS — K859 Acute pancreatitis without necrosis or infection, unspecified: Secondary | ICD-10-CM

## 2010-08-17 DIAGNOSIS — R945 Abnormal results of liver function studies: Secondary | ICD-10-CM

## 2010-08-17 LAB — GLUCOSE, CAPILLARY
Glucose-Capillary: 262 mg/dL — ABNORMAL HIGH (ref 70–99)
Glucose-Capillary: 264 mg/dL — ABNORMAL HIGH (ref 70–99)
Glucose-Capillary: 282 mg/dL — ABNORMAL HIGH (ref 70–99)
Glucose-Capillary: 291 mg/dL — ABNORMAL HIGH (ref 70–99)
Glucose-Capillary: 325 mg/dL — ABNORMAL HIGH (ref 70–99)
Glucose-Capillary: 399 mg/dL — ABNORMAL HIGH (ref 70–99)

## 2010-08-17 LAB — CBC
HCT: 33.9 % — ABNORMAL LOW (ref 36.0–46.0)
Hemoglobin: 10.1 g/dL — ABNORMAL LOW (ref 12.0–15.0)
MCH: 22.2 pg — ABNORMAL LOW (ref 26.0–34.0)
MCHC: 29.8 g/dL — ABNORMAL LOW (ref 30.0–36.0)
MCV: 74.7 fL — ABNORMAL LOW (ref 78.0–100.0)
Platelets: 249 10*3/uL (ref 150–400)
RBC: 4.54 MIL/uL (ref 3.87–5.11)
RDW: 16.8 % — ABNORMAL HIGH (ref 11.5–15.5)
WBC: 16.5 10*3/uL — ABNORMAL HIGH (ref 4.0–10.5)

## 2010-08-17 LAB — BLOOD GAS, ARTERIAL
Acid-Base Excess: 5.9 mmol/L — ABNORMAL HIGH (ref 0.0–2.0)
Acid-Base Excess: 7.1 mmol/L — ABNORMAL HIGH (ref 0.0–2.0)
Acid-Base Excess: 8.2 mmol/L — ABNORMAL HIGH (ref 0.0–2.0)
Bicarbonate: 32.8 mEq/L — ABNORMAL HIGH (ref 20.0–24.0)
Bicarbonate: 34.3 mEq/L — ABNORMAL HIGH (ref 20.0–24.0)
Bicarbonate: 34.6 mEq/L — ABNORMAL HIGH (ref 20.0–24.0)
Delivery systems: POSITIVE
Delivery systems: POSITIVE
Drawn by: 340271
Drawn by: 129801
Expiratory PAP: 5
Expiratory PAP: 5
FIO2: 40 %
FIO2: 0.4 %
FIO2: 0.5 %
Inspiratory PAP: 13
Inspiratory PAP: 13
Mode: POSITIVE
Mode: POSITIVE
O2 Saturation: 94.4 %
O2 Saturation: 91.9 %
O2 Saturation: 89.7 %
PEEP: 5 cmH2O
Patient temperature: 98.6
Patient temperature: 98.6
Patient temperature: 98.6
Pressure control: 18 cmH2O
RATE: 12 resp/min
TCO2: 29.2 mmol/L (ref 0–100)
TCO2: 31.4 mmol/L (ref 0–100)
TCO2: 32.1 mmol/L (ref 0–100)
pCO2 arterial: 59.3 mmHg (ref 35.0–45.0)
pCO2 arterial: 64.6 mmHg (ref 35.0–45.0)
pCO2 arterial: 60.9 mmHg (ref 35.0–45.0)
pH, Arterial: 7.361 (ref 7.350–7.400)
pH, Arterial: 7.344 — ABNORMAL LOW (ref 7.350–7.400)
pH, Arterial: 7.373 (ref 7.350–7.400)
pO2, Arterial: 71.6 mmHg — ABNORMAL LOW (ref 80.0–100.0)
pO2, Arterial: 63.4 mmHg — ABNORMAL LOW (ref 80.0–100.0)
pO2, Arterial: 55.9 mmHg — ABNORMAL LOW (ref 80.0–100.0)

## 2010-08-17 LAB — MAGNESIUM: Magnesium: 2.5 mg/dL (ref 1.5–2.5)

## 2010-08-17 LAB — DIFFERENTIAL
Basophils Absolute: 0 10*3/uL (ref 0.0–0.1)
Basophils Relative: 0 % (ref 0–1)
Eosinophils Absolute: 0 10*3/uL (ref 0.0–0.7)
Eosinophils Relative: 0 % (ref 0–5)
Lymphocytes Relative: 4 % — ABNORMAL LOW (ref 12–46)
Lymphs Abs: 0.7 10*3/uL (ref 0.7–4.0)
Monocytes Absolute: 0.7 10*3/uL (ref 0.1–1.0)
Monocytes Relative: 4 % (ref 3–12)
Neutro Abs: 15.1 10*3/uL — ABNORMAL HIGH (ref 1.7–7.7)
Neutrophils Relative %: 92 % — ABNORMAL HIGH (ref 43–77)

## 2010-08-17 LAB — URINE MICROSCOPIC-ADD ON

## 2010-08-17 LAB — COMPREHENSIVE METABOLIC PANEL
ALT: 55 U/L — ABNORMAL HIGH (ref 0–35)
AST: 20 U/L (ref 0–37)
Albumin: 2.6 g/dL — ABNORMAL LOW (ref 3.5–5.2)
Alkaline Phosphatase: 91 U/L (ref 39–117)
BUN: 11 mg/dL (ref 6–23)
CO2: 33 mEq/L — ABNORMAL HIGH (ref 19–32)
Calcium: 7.9 mg/dL — ABNORMAL LOW (ref 8.4–10.5)
Chloride: 101 mEq/L (ref 96–112)
Creatinine, Ser: 0.87 mg/dL (ref 0.4–1.2)
GFR calc Af Amer: >60 mL/min (ref >60)
GFR calc non Af Amer: >60 mL/min (ref >60)
Glucose, Bld: 286 mg/dL — ABNORMAL HIGH (ref 70–99)
Potassium: 4.3 mEq/L (ref 3.5–5.1)
Sodium: 140 mEq/L (ref 135–145)
Total Bilirubin: 0.6 mg/dL (ref 0.3–1.2)
Total Protein: 6.4 g/dL (ref 6.0–8.3)

## 2010-08-17 LAB — URINALYSIS, ROUTINE W REFLEX MICROSCOPIC
Bilirubin Urine: NEGATIVE
Ketones, ur: 15 mg/dL — AB
Nitrite: NEGATIVE
Protein, ur: 30 mg/dL — AB
Specific Gravity, Urine: 1.026 (ref 1.005–1.030)
Urine Glucose, Fasting: 500 mg/dL — AB
Urobilinogen, UA: 1 mg/dL (ref 0.0–1.0)
pH: 6.5 (ref 5.0–8.0)

## 2010-08-17 LAB — AMMONIA: Ammonia: 28 umol/L (ref 11–35)

## 2010-08-17 LAB — PHOSPHORUS: Phosphorus: 1.8 mg/dL — ABNORMAL LOW (ref 2.3–4.6)

## 2010-08-17 LAB — PREALBUMIN: Prealbumin: 7 mg/dL — ABNORMAL LOW (ref 17.0–34.0)

## 2010-08-17 LAB — PROCALCITONIN: Procalcitonin: 0.95 ng/mL

## 2010-08-17 LAB — CHOLESTEROL, TOTAL: Cholesterol: 176 mg/dL (ref 0–200)

## 2010-08-17 LAB — TRIGLYCERIDES: Triglycerides: 96 mg/dL (ref <150)

## 2010-08-18 ENCOUNTER — Inpatient Hospital Stay (HOSPITAL_COMMUNITY): Payer: Medicare HMO

## 2010-08-18 LAB — CBC
HCT: 33.8 % — ABNORMAL LOW (ref 36.0–46.0)
HCT: 34.5 % — ABNORMAL LOW (ref 36.0–46.0)
Hemoglobin: 10 g/dL — ABNORMAL LOW (ref 12.0–15.0)
Hemoglobin: 10.1 g/dL — ABNORMAL LOW (ref 12.0–15.0)
MCH: 22.2 pg — ABNORMAL LOW (ref 26.0–34.0)
MCH: 22.4 pg — ABNORMAL LOW (ref 26.0–34.0)
MCHC: 29.3 g/dL — ABNORMAL LOW (ref 30.0–36.0)
MCHC: 29.6 g/dL — ABNORMAL LOW (ref 30.0–36.0)
MCV: 75.6 fL — ABNORMAL LOW (ref 78.0–100.0)
MCV: 75.8 fL — ABNORMAL LOW (ref 78.0–100.0)
Platelets: 264 10*3/uL (ref 150–400)
Platelets: 271 10*3/uL (ref 150–400)
RBC: 4.47 MIL/uL (ref 3.87–5.11)
RBC: 4.55 MIL/uL (ref 3.87–5.11)
RDW: 16.9 % — ABNORMAL HIGH (ref 11.5–15.5)
RDW: 16.9 % — ABNORMAL HIGH (ref 11.5–15.5)
WBC: 14.2 10*3/uL — ABNORMAL HIGH (ref 4.0–10.5)
WBC: 14.3 10*3/uL — ABNORMAL HIGH (ref 4.0–10.5)

## 2010-08-18 LAB — URINE CULTURE
Colony Count: NO GROWTH
Culture  Setup Time: 201202231317
Culture: NO GROWTH
Special Requests: NEGATIVE

## 2010-08-18 LAB — GLUCOSE, CAPILLARY
Glucose-Capillary: 237 mg/dL — ABNORMAL HIGH (ref 70–99)
Glucose-Capillary: 250 mg/dL — ABNORMAL HIGH (ref 70–99)
Glucose-Capillary: 291 mg/dL — ABNORMAL HIGH (ref 70–99)
Glucose-Capillary: 273 mg/dL — ABNORMAL HIGH (ref 70–99)
Glucose-Capillary: 174 mg/dL — ABNORMAL HIGH (ref 70–99)
Glucose-Capillary: 236 mg/dL — ABNORMAL HIGH (ref 70–99)

## 2010-08-18 LAB — PHOSPHORUS: Phosphorus: 2 mg/dL — ABNORMAL LOW (ref 2.3–4.6)

## 2010-08-18 LAB — BASIC METABOLIC PANEL
BUN: 15 mg/dL (ref 6–23)
CO2: 37 mEq/L — ABNORMAL HIGH (ref 19–32)
Calcium: 8.2 mg/dL — ABNORMAL LOW (ref 8.4–10.5)
Chloride: 98 mEq/L (ref 96–112)
Creatinine, Ser: 0.85 mg/dL (ref 0.4–1.2)
GFR calc Af Amer: >60 mL/min (ref >60)
GFR calc non Af Amer: >60 mL/min (ref >60)
Glucose, Bld: 268 mg/dL — ABNORMAL HIGH (ref 70–99)
Potassium: 3.9 mEq/L (ref 3.5–5.1)
Sodium: 143 mEq/L (ref 135–145)

## 2010-08-18 LAB — MAGNESIUM: Magnesium: 2.4 mg/dL (ref 1.5–2.5)

## 2010-08-18 LAB — ANTI-SMOOTH MUSCLE ANTIBODY, IGG: F-Actin IgG: <20

## 2010-08-19 ENCOUNTER — Inpatient Hospital Stay (HOSPITAL_COMMUNITY): Payer: Medicare HMO

## 2010-08-19 LAB — COMPREHENSIVE METABOLIC PANEL
ALT: 36 U/L — ABNORMAL HIGH (ref 0–35)
AST: 21 U/L (ref 0–37)
Albumin: 2.3 g/dL — ABNORMAL LOW (ref 3.5–5.2)
Alkaline Phosphatase: 85 U/L (ref 39–117)
BUN: 19 mg/dL (ref 6–23)
CO2: 39 mEq/L — ABNORMAL HIGH (ref 19–32)
Calcium: 8.2 mg/dL — ABNORMAL LOW (ref 8.4–10.5)
Chloride: 92 mEq/L — ABNORMAL LOW (ref 96–112)
Creatinine, Ser: 0.89 mg/dL (ref 0.4–1.2)
GFR calc Af Amer: >60 mL/min (ref >60)
GFR calc non Af Amer: >60 mL/min (ref >60)
Glucose, Bld: 219 mg/dL — ABNORMAL HIGH (ref 70–99)
Potassium: 3.8 mEq/L (ref 3.5–5.1)
Sodium: 140 mEq/L (ref 135–145)
Total Bilirubin: 0.7 mg/dL (ref 0.3–1.2)
Total Protein: 6.1 g/dL (ref 6.0–8.3)

## 2010-08-19 LAB — CBC
HCT: 35.3 % — ABNORMAL LOW (ref 36.0–46.0)
Hemoglobin: 10.6 g/dL — ABNORMAL LOW (ref 12.0–15.0)
MCH: 22.5 pg — ABNORMAL LOW (ref 26.0–34.0)
MCHC: 30 g/dL (ref 30.0–36.0)
MCV: 74.9 fL — ABNORMAL LOW (ref 78.0–100.0)
Platelets: 281 10*3/uL (ref 150–400)
RBC: 4.71 MIL/uL (ref 3.87–5.11)
RDW: 16.4 % — ABNORMAL HIGH (ref 11.5–15.5)
WBC: 14.1 10*3/uL — ABNORMAL HIGH (ref 4.0–10.5)

## 2010-08-19 LAB — GLUCOSE, CAPILLARY
Glucose-Capillary: 236 mg/dL — ABNORMAL HIGH (ref 70–99)
Glucose-Capillary: 250 mg/dL — ABNORMAL HIGH (ref 70–99)
Glucose-Capillary: 195 mg/dL — ABNORMAL HIGH (ref 70–99)
Glucose-Capillary: 216 mg/dL — ABNORMAL HIGH (ref 70–99)
Glucose-Capillary: 287 mg/dL — ABNORMAL HIGH (ref 70–99)
Glucose-Capillary: 314 mg/dL — ABNORMAL HIGH (ref 70–99)
Glucose-Capillary: 177 mg/dL — ABNORMAL HIGH (ref 70–99)

## 2010-08-19 LAB — BLOOD GAS, ARTERIAL
Acid-Base Excess: 12.8 mmol/L — ABNORMAL HIGH (ref 0.0–2.0)
Bicarbonate: 40.2 mEq/L — ABNORMAL HIGH (ref 20.0–24.0)
Drawn by: 340271
FIO2: 0.48 %
O2 Saturation: 89.3 %
Patient temperature: 98.6
TCO2: 35.7 mmol/L (ref 0–100)
pCO2 arterial: 65.6 mmHg (ref 35.0–45.0)
pH, Arterial: 7.405 — ABNORMAL HIGH (ref 7.350–7.400)
pO2, Arterial: 56.8 mmHg — ABNORMAL LOW (ref 80.0–100.0)

## 2010-08-19 LAB — PHOSPHORUS: Phosphorus: 3.1 mg/dL (ref 2.3–4.6)

## 2010-08-19 LAB — MAGNESIUM: Magnesium: 2.1 mg/dL (ref 1.5–2.5)

## 2010-08-20 LAB — BASIC METABOLIC PANEL
BUN: 25 mg/dL — ABNORMAL HIGH (ref 6–23)
CO2: 36 mEq/L — ABNORMAL HIGH (ref 19–32)
Calcium: 8.1 mg/dL — ABNORMAL LOW (ref 8.4–10.5)
Chloride: 92 mEq/L — ABNORMAL LOW (ref 96–112)
Creatinine, Ser: 1.03 mg/dL (ref 0.4–1.2)
GFR calc Af Amer: >60 mL/min (ref >60)
GFR calc non Af Amer: 54 mL/min — ABNORMAL LOW (ref >60)
Glucose, Bld: 187 mg/dL — ABNORMAL HIGH (ref 70–99)
Potassium: 3.7 mEq/L (ref 3.5–5.1)
Sodium: 136 mEq/L (ref 135–145)

## 2010-08-20 LAB — GLUCOSE, CAPILLARY
Glucose-Capillary: 142 mg/dL — ABNORMAL HIGH (ref 70–99)
Glucose-Capillary: 162 mg/dL — ABNORMAL HIGH (ref 70–99)
Glucose-Capillary: 173 mg/dL — ABNORMAL HIGH (ref 70–99)
Glucose-Capillary: 214 mg/dL — ABNORMAL HIGH (ref 70–99)
Glucose-Capillary: 199 mg/dL — ABNORMAL HIGH (ref 70–99)
Glucose-Capillary: 283 mg/dL — ABNORMAL HIGH (ref 70–99)

## 2010-08-20 LAB — CBC
HCT: 33.6 % — ABNORMAL LOW (ref 36.0–46.0)
Hemoglobin: 10.3 g/dL — ABNORMAL LOW (ref 12.0–15.0)
MCH: 22.6 pg — ABNORMAL LOW (ref 26.0–34.0)
MCHC: 30.7 g/dL (ref 30.0–36.0)
MCV: 73.7 fL — ABNORMAL LOW (ref 78.0–100.0)
Platelets: 285 10*3/uL (ref 150–400)
RBC: 4.56 MIL/uL (ref 3.87–5.11)
RDW: 16.1 % — ABNORMAL HIGH (ref 11.5–15.5)
WBC: 14.7 10*3/uL — ABNORMAL HIGH (ref 4.0–10.5)

## 2010-08-21 LAB — COMPREHENSIVE METABOLIC PANEL
ALT: 28 U/L (ref 0–35)
AST: 22 U/L (ref 0–37)
Albumin: 2 g/dL — ABNORMAL LOW (ref 3.5–5.2)
Alkaline Phosphatase: 73 U/L (ref 39–117)
BUN: 32 mg/dL — ABNORMAL HIGH (ref 6–23)
CO2: 32 mEq/L (ref 19–32)
Calcium: 7.8 mg/dL — ABNORMAL LOW (ref 8.4–10.5)
Chloride: 96 mEq/L (ref 96–112)
Creatinine, Ser: 1.28 mg/dL — ABNORMAL HIGH (ref 0.4–1.2)
GFR calc Af Amer: 51 mL/min — ABNORMAL LOW (ref >60)
GFR calc non Af Amer: 42 mL/min — ABNORMAL LOW (ref >60)
Glucose, Bld: 98 mg/dL (ref 70–99)
Potassium: 3.5 mEq/L (ref 3.5–5.1)
Sodium: 136 mEq/L (ref 135–145)
Total Bilirubin: 0.4 mg/dL (ref 0.3–1.2)
Total Protein: 5.7 g/dL — ABNORMAL LOW (ref 6.0–8.3)

## 2010-08-21 LAB — GLUCOSE, CAPILLARY
Glucose-Capillary: 122 mg/dL — ABNORMAL HIGH (ref 70–99)
Glucose-Capillary: 90 mg/dL (ref 70–99)
Glucose-Capillary: 99 mg/dL (ref 70–99)
Glucose-Capillary: 167 mg/dL — ABNORMAL HIGH (ref 70–99)
Glucose-Capillary: 196 mg/dL — ABNORMAL HIGH (ref 70–99)
Glucose-Capillary: 244 mg/dL — ABNORMAL HIGH (ref 70–99)

## 2010-08-21 LAB — CBC
HCT: 30.6 % — ABNORMAL LOW (ref 36.0–46.0)
Hemoglobin: 9.4 g/dL — ABNORMAL LOW (ref 12.0–15.0)
MCH: 22.4 pg — ABNORMAL LOW (ref 26.0–34.0)
MCHC: 30.7 g/dL (ref 30.0–36.0)
MCV: 73 fL — ABNORMAL LOW (ref 78.0–100.0)
Platelets: 302 10*3/uL (ref 150–400)
RBC: 4.19 MIL/uL (ref 3.87–5.11)
RDW: 16.2 % — ABNORMAL HIGH (ref 11.5–15.5)
WBC: 14.4 10*3/uL — ABNORMAL HIGH (ref 4.0–10.5)

## 2010-08-21 LAB — DIFFERENTIAL
Basophils Absolute: 0 10*3/uL (ref 0.0–0.1)
Basophils Relative: 0 % (ref 0–1)
Eosinophils Absolute: 0.3 10*3/uL (ref 0.0–0.7)
Eosinophils Relative: 2 % (ref 0–5)
Lymphocytes Relative: 9 % — ABNORMAL LOW (ref 12–46)
Lymphs Abs: 1.3 10*3/uL (ref 0.7–4.0)
Monocytes Absolute: 0.9 10*3/uL (ref 0.1–1.0)
Monocytes Relative: 6 % (ref 3–12)
Neutro Abs: 11.9 10*3/uL — ABNORMAL HIGH (ref 1.7–7.7)
Neutrophils Relative %: 83 % — ABNORMAL HIGH (ref 43–77)

## 2010-08-21 LAB — TRIGLYCERIDES: Triglycerides: 93 mg/dL (ref <150)

## 2010-08-21 LAB — PHOSPHORUS: Phosphorus: 5.2 mg/dL — ABNORMAL HIGH (ref 2.3–4.6)

## 2010-08-21 LAB — PREALBUMIN: Prealbumin: 10.6 mg/dL — ABNORMAL LOW (ref 17.0–34.0)

## 2010-08-21 LAB — LIPASE, BLOOD: Lipase: 24 U/L (ref 11–59)

## 2010-08-21 LAB — MAGNESIUM: Magnesium: 2 mg/dL (ref 1.5–2.5)

## 2010-08-21 LAB — CHOLESTEROL, TOTAL: Cholesterol: 113 mg/dL (ref 0–200)

## 2010-08-22 LAB — GLUCOSE, CAPILLARY
Glucose-Capillary: 106 mg/dL — ABNORMAL HIGH (ref 70–99)
Glucose-Capillary: 114 mg/dL — ABNORMAL HIGH (ref 70–99)
Glucose-Capillary: 149 mg/dL — ABNORMAL HIGH (ref 70–99)
Glucose-Capillary: 182 mg/dL — ABNORMAL HIGH (ref 70–99)
Glucose-Capillary: 191 mg/dL — ABNORMAL HIGH (ref 70–99)
Glucose-Capillary: 265 mg/dL — ABNORMAL HIGH (ref 70–99)

## 2010-08-22 LAB — BLOOD GAS, ARTERIAL
Acid-Base Excess: 4.4 mmol/L — ABNORMAL HIGH (ref 0.0–2.0)
Bicarbonate: 30.2 mEq/L — ABNORMAL HIGH (ref 20.0–24.0)
Delivery systems: POSITIVE
Drawn by: 308601
O2 Content: 2 L/min
O2 Saturation: 97 %
Patient temperature: 98.6
TCO2: 26.8 mmol/L (ref 0–100)
pCO2 arterial: 52.3 mmHg — ABNORMAL HIGH (ref 35.0–45.0)
pH, Arterial: 7.38 (ref 7.350–7.400)
pO2, Arterial: 88.1 mmHg (ref 80.0–100.0)

## 2010-08-22 LAB — CBC
HCT: 29.9 % — ABNORMAL LOW (ref 36.0–46.0)
Hemoglobin: 9.2 g/dL — ABNORMAL LOW (ref 12.0–15.0)
MCH: 22.4 pg — ABNORMAL LOW (ref 26.0–34.0)
MCHC: 30.8 g/dL (ref 30.0–36.0)
MCV: 72.9 fL — ABNORMAL LOW (ref 78.0–100.0)
Platelets: 303 10*3/uL (ref 150–400)
RBC: 4.1 MIL/uL (ref 3.87–5.11)
RDW: 16 % — ABNORMAL HIGH (ref 11.5–15.5)
WBC: 13 10*3/uL — ABNORMAL HIGH (ref 4.0–10.5)

## 2010-08-22 LAB — COMPREHENSIVE METABOLIC PANEL
ALT: 23 U/L (ref 0–35)
AST: 20 U/L (ref 0–37)
Albumin: 2.1 g/dL — ABNORMAL LOW (ref 3.5–5.2)
Alkaline Phosphatase: 78 U/L (ref 39–117)
BUN: 29 mg/dL — ABNORMAL HIGH (ref 6–23)
CO2: 32 mEq/L (ref 19–32)
Calcium: 8.1 mg/dL — ABNORMAL LOW (ref 8.4–10.5)
Chloride: 96 mEq/L (ref 96–112)
Creatinine, Ser: 1.27 mg/dL — ABNORMAL HIGH (ref 0.4–1.2)
GFR calc Af Amer: 52 mL/min — ABNORMAL LOW (ref >60)
GFR calc non Af Amer: 43 mL/min — ABNORMAL LOW (ref >60)
Glucose, Bld: 103 mg/dL — ABNORMAL HIGH (ref 70–99)
Potassium: 4 mEq/L (ref 3.5–5.1)
Sodium: 134 mEq/L — ABNORMAL LOW (ref 135–145)
Total Bilirubin: 0.4 mg/dL (ref 0.3–1.2)
Total Protein: 5.8 g/dL — ABNORMAL LOW (ref 6.0–8.3)

## 2010-08-22 LAB — BRAIN NATRIURETIC PEPTIDE: Pro B Natriuretic peptide (BNP): 277 pg/mL — ABNORMAL HIGH (ref 0.0–100.0)

## 2010-08-22 LAB — PHOSPHORUS: Phosphorus: 4.1 mg/dL (ref 2.3–4.6)

## 2010-08-23 DIAGNOSIS — I214 Non-ST elevation (NSTEMI) myocardial infarction: Secondary | ICD-10-CM

## 2010-08-23 LAB — GLUCOSE, CAPILLARY
Glucose-Capillary: 163 mg/dL — ABNORMAL HIGH (ref 70–99)
Glucose-Capillary: 244 mg/dL — ABNORMAL HIGH (ref 70–99)
Glucose-Capillary: 234 mg/dL — ABNORMAL HIGH (ref 70–99)
Glucose-Capillary: 266 mg/dL — ABNORMAL HIGH (ref 70–99)

## 2010-08-23 LAB — CBC
HCT: 29.4 % — ABNORMAL LOW (ref 36.0–46.0)
Hemoglobin: 8.9 g/dL — ABNORMAL LOW (ref 12.0–15.0)
MCH: 22.1 pg — ABNORMAL LOW (ref 26.0–34.0)
MCHC: 30.3 g/dL (ref 30.0–36.0)
MCV: 73 fL — ABNORMAL LOW (ref 78.0–100.0)
Platelets: 345 10*3/uL (ref 150–400)
RBC: 4.03 MIL/uL (ref 3.87–5.11)
RDW: 16.2 % — ABNORMAL HIGH (ref 11.5–15.5)
WBC: 11.9 10*3/uL — ABNORMAL HIGH (ref 4.0–10.5)

## 2010-08-24 ENCOUNTER — Ambulatory Visit (HOSPITAL_COMMUNITY): Payer: Medicare HMO

## 2010-08-24 DIAGNOSIS — R0602 Shortness of breath: Secondary | ICD-10-CM | POA: Insufficient documentation

## 2010-08-24 DIAGNOSIS — I10 Essential (primary) hypertension: Secondary | ICD-10-CM | POA: Insufficient documentation

## 2010-08-24 DIAGNOSIS — E785 Hyperlipidemia, unspecified: Secondary | ICD-10-CM | POA: Insufficient documentation

## 2010-08-24 DIAGNOSIS — M549 Dorsalgia, unspecified: Secondary | ICD-10-CM | POA: Insufficient documentation

## 2010-08-24 DIAGNOSIS — R232 Flushing: Secondary | ICD-10-CM | POA: Insufficient documentation

## 2010-08-24 DIAGNOSIS — E119 Type 2 diabetes mellitus without complications: Secondary | ICD-10-CM | POA: Insufficient documentation

## 2010-08-24 DIAGNOSIS — R110 Nausea: Secondary | ICD-10-CM | POA: Insufficient documentation

## 2010-08-24 LAB — GLUCOSE, CAPILLARY
Glucose-Capillary: 198 mg/dL — ABNORMAL HIGH (ref 70–99)
Glucose-Capillary: 251 mg/dL — ABNORMAL HIGH (ref 70–99)
Glucose-Capillary: 181 mg/dL — ABNORMAL HIGH (ref 70–99)
Glucose-Capillary: 204 mg/dL — ABNORMAL HIGH (ref 70–99)

## 2010-08-24 LAB — BASIC METABOLIC PANEL
BUN: 19 mg/dL (ref 6–23)
CO2: 30 mEq/L (ref 19–32)
Calcium: 8.3 mg/dL — ABNORMAL LOW (ref 8.4–10.5)
Chloride: 100 mEq/L (ref 96–112)
Creatinine, Ser: 0.97 mg/dL (ref 0.4–1.2)
GFR calc Af Amer: >60 mL/min (ref >60)
GFR calc non Af Amer: 58 mL/min — ABNORMAL LOW (ref >60)
Glucose, Bld: 209 mg/dL — ABNORMAL HIGH (ref 70–99)
Potassium: 4.3 mEq/L (ref 3.5–5.1)
Sodium: 135 mEq/L (ref 135–145)

## 2010-08-24 LAB — CBC
HCT: 28.9 % — ABNORMAL LOW (ref 36.0–46.0)
Hemoglobin: 9 g/dL — ABNORMAL LOW (ref 12.0–15.0)
MCH: 22.8 pg — ABNORMAL LOW (ref 26.0–34.0)
MCHC: 31.1 g/dL (ref 30.0–36.0)
MCV: 73.2 fL — ABNORMAL LOW (ref 78.0–100.0)
Platelets: 377 10*3/uL (ref 150–400)
RBC: 3.95 MIL/uL (ref 3.87–5.11)
RDW: 16.1 % — ABNORMAL HIGH (ref 11.5–15.5)
WBC: 10.9 10*3/uL — ABNORMAL HIGH (ref 4.0–10.5)

## 2010-08-25 ENCOUNTER — Inpatient Hospital Stay (HOSPITAL_COMMUNITY): Payer: Medicare HMO

## 2010-08-25 DIAGNOSIS — R079 Chest pain, unspecified: Secondary | ICD-10-CM

## 2010-08-25 DIAGNOSIS — M7989 Other specified soft tissue disorders: Secondary | ICD-10-CM

## 2010-08-25 LAB — GLUCOSE, CAPILLARY
Glucose-Capillary: 198 mg/dL — ABNORMAL HIGH (ref 70–99)
Glucose-Capillary: 174 mg/dL — ABNORMAL HIGH (ref 70–99)
Glucose-Capillary: 260 mg/dL — ABNORMAL HIGH (ref 70–99)

## 2010-08-25 LAB — CBC
HCT: 29.2 % — ABNORMAL LOW (ref 36.0–46.0)
Hemoglobin: 8.9 g/dL — ABNORMAL LOW (ref 12.0–15.0)
MCH: 22.3 pg — ABNORMAL LOW (ref 26.0–34.0)
MCHC: 30.5 g/dL (ref 30.0–36.0)
MCV: 73 fL — ABNORMAL LOW (ref 78.0–100.0)
Platelets: 395 10*3/uL (ref 150–400)
RBC: 4 MIL/uL (ref 3.87–5.11)
RDW: 15.9 % — ABNORMAL HIGH (ref 11.5–15.5)
WBC: 11.1 10*3/uL — ABNORMAL HIGH (ref 4.0–10.5)

## 2010-08-25 MED ORDER — TECHNETIUM TC 99M TETROFOSMIN IV KIT: 30.0000 | PACK | Freq: Once | INTRAVENOUS | Status: AC | PRN

## 2010-08-25 MED ORDER — TECHNETIUM TC 99M TETROFOSMIN IV KIT
30.0000 | PACK | Freq: Once | INTRAVENOUS | Status: AC | PRN
  Administered 2010-08-25: 30 via INTRAVENOUS

## 2010-08-26 LAB — BASIC METABOLIC PANEL
BUN: 8 mg/dL (ref 6–23)
CO2: 32 mEq/L (ref 19–32)
Calcium: 8.5 mg/dL (ref 8.4–10.5)
Chloride: 98 mEq/L (ref 96–112)
Creatinine, Ser: 0.84 mg/dL (ref 0.4–1.2)
GFR calc Af Amer: >60 mL/min (ref >60)
GFR calc non Af Amer: >60 mL/min (ref >60)
Glucose, Bld: 223 mg/dL — ABNORMAL HIGH (ref 70–99)
Potassium: 4.3 mEq/L (ref 3.5–5.1)
Sodium: 136 mEq/L (ref 135–145)

## 2010-08-26 LAB — CBC
HCT: 27.9 % — ABNORMAL LOW (ref 36.0–46.0)
Hemoglobin: 8.5 g/dL — ABNORMAL LOW (ref 12.0–15.0)
MCH: 22.3 pg — ABNORMAL LOW (ref 26.0–34.0)
MCHC: 30.5 g/dL (ref 30.0–36.0)
MCV: 73.2 fL — ABNORMAL LOW (ref 78.0–100.0)
Platelets: 435 10*3/uL — ABNORMAL HIGH (ref 150–400)
RBC: 3.81 MIL/uL — ABNORMAL LOW (ref 3.87–5.11)
RDW: 15.8 % — ABNORMAL HIGH (ref 11.5–15.5)
WBC: 9.1 10*3/uL (ref 4.0–10.5)

## 2010-08-26 LAB — GLUCOSE, CAPILLARY
Glucose-Capillary: 173 mg/dL — ABNORMAL HIGH (ref 70–99)
Glucose-Capillary: 202 mg/dL — ABNORMAL HIGH (ref 70–99)
Glucose-Capillary: 249 mg/dL — ABNORMAL HIGH (ref 70–99)
Glucose-Capillary: 233 mg/dL — ABNORMAL HIGH (ref 70–99)

## 2010-08-27 LAB — GLUCOSE, CAPILLARY
Glucose-Capillary: 239 mg/dL — ABNORMAL HIGH (ref 70–99)
Glucose-Capillary: 224 mg/dL — ABNORMAL HIGH (ref 70–99)
Glucose-Capillary: 215 mg/dL — ABNORMAL HIGH (ref 70–99)
Glucose-Capillary: 206 mg/dL — ABNORMAL HIGH (ref 70–99)
Glucose-Capillary: 240 mg/dL — ABNORMAL HIGH (ref 70–99)

## 2010-08-27 LAB — CBC
HCT: 28.5 % — ABNORMAL LOW (ref 36.0–46.0)
Hemoglobin: 8.9 g/dL — ABNORMAL LOW (ref 12.0–15.0)
MCH: 23.1 pg — ABNORMAL LOW (ref 26.0–34.0)
MCHC: 31.2 g/dL (ref 30.0–36.0)
MCV: 73.8 fL — ABNORMAL LOW (ref 78.0–100.0)
Platelets: 471 10*3/uL — ABNORMAL HIGH (ref 150–400)
RBC: 3.86 MIL/uL — ABNORMAL LOW (ref 3.87–5.11)
RDW: 15.7 % — ABNORMAL HIGH (ref 11.5–15.5)
WBC: 8.1 10*3/uL (ref 4.0–10.5)

## 2010-08-27 LAB — BRAIN NATRIURETIC PEPTIDE: Pro B Natriuretic peptide (BNP): 193 pg/mL — ABNORMAL HIGH (ref 0.0–100.0)

## 2010-08-28 DIAGNOSIS — I25118 Atherosclerotic heart disease of native coronary artery with other forms of angina pectoris: Secondary | ICD-10-CM

## 2010-08-28 LAB — GLUCOSE, CAPILLARY
Glucose-Capillary: 217 mg/dL — ABNORMAL HIGH (ref 70–99)
Glucose-Capillary: 276 mg/dL — ABNORMAL HIGH (ref 70–99)
Glucose-Capillary: 150 mg/dL — ABNORMAL HIGH (ref 70–99)
Glucose-Capillary: 219 mg/dL — ABNORMAL HIGH (ref 70–99)

## 2010-08-28 LAB — CBC
HCT: 30.2 % — ABNORMAL LOW (ref 36.0–46.0)
Hemoglobin: 9.1 g/dL — ABNORMAL LOW (ref 12.0–15.0)
MCH: 22 pg — ABNORMAL LOW (ref 26.0–34.0)
MCHC: 30.1 g/dL (ref 30.0–36.0)
MCV: 73.1 fL — ABNORMAL LOW (ref 78.0–100.0)
Platelets: 496 10*3/uL — ABNORMAL HIGH (ref 150–400)
RBC: 4.13 MIL/uL (ref 3.87–5.11)
RDW: 15.8 % — ABNORMAL HIGH (ref 11.5–15.5)
WBC: 6.8 10*3/uL (ref 4.0–10.5)

## 2010-08-28 LAB — BASIC METABOLIC PANEL
BUN: 9 mg/dL (ref 6–23)
CO2: 35 mEq/L — ABNORMAL HIGH (ref 19–32)
Calcium: 8.6 mg/dL (ref 8.4–10.5)
Chloride: 93 mEq/L — ABNORMAL LOW (ref 96–112)
Creatinine, Ser: 0.89 mg/dL (ref 0.4–1.2)
GFR calc Af Amer: >60 mL/min (ref >60)
GFR calc non Af Amer: >60 mL/min (ref >60)
Glucose, Bld: 257 mg/dL — ABNORMAL HIGH (ref 70–99)
Potassium: 3.9 mEq/L (ref 3.5–5.1)
Sodium: 135 mEq/L (ref 135–145)

## 2010-08-29 LAB — BASIC METABOLIC PANEL
BUN: 15 mg/dL (ref 6–23)
CO2: 33 mEq/L — ABNORMAL HIGH (ref 19–32)
Calcium: 9 mg/dL (ref 8.4–10.5)
Chloride: 95 mEq/L — ABNORMAL LOW (ref 96–112)
Creatinine, Ser: 0.99 mg/dL (ref 0.4–1.2)
GFR calc Af Amer: >60 mL/min (ref >60)
GFR calc non Af Amer: 57 mL/min — ABNORMAL LOW (ref >60)
Glucose, Bld: 244 mg/dL — ABNORMAL HIGH (ref 70–99)
Potassium: 4.1 mEq/L (ref 3.5–5.1)
Sodium: 136 mEq/L (ref 135–145)

## 2010-08-29 LAB — CBC
HCT: 30.6 % — ABNORMAL LOW (ref 36.0–46.0)
Hemoglobin: 9.5 g/dL — ABNORMAL LOW (ref 12.0–15.0)
MCH: 22.8 pg — ABNORMAL LOW (ref 26.0–34.0)
MCHC: 31 g/dL (ref 30.0–36.0)
MCV: 73.6 fL — ABNORMAL LOW (ref 78.0–100.0)
Platelets: 486 10*3/uL — ABNORMAL HIGH (ref 150–400)
RBC: 4.16 MIL/uL (ref 3.87–5.11)
RDW: 15.7 % — ABNORMAL HIGH (ref 11.5–15.5)
WBC: 7.9 10*3/uL (ref 4.0–10.5)

## 2010-08-29 LAB — GLUCOSE, CAPILLARY
Glucose-Capillary: 230 mg/dL — ABNORMAL HIGH (ref 70–99)
Glucose-Capillary: 214 mg/dL — ABNORMAL HIGH (ref 70–99)
Glucose-Capillary: 248 mg/dL — ABNORMAL HIGH (ref 70–99)
Glucose-Capillary: 239 mg/dL — ABNORMAL HIGH (ref 70–99)

## 2010-08-29 LAB — BRAIN NATRIURETIC PEPTIDE: Pro B Natriuretic peptide (BNP): 99.1 pg/mL (ref 0.0–100.0)

## 2010-08-29 LAB — SURGICAL PCR SCREEN
MRSA, PCR: NEGATIVE
Staphylococcus aureus: NEGATIVE

## 2010-08-30 ENCOUNTER — Other Ambulatory Visit: Payer: Self-pay | Admitting: General Surgery

## 2010-08-30 LAB — CBC
HCT: 30.9 % — ABNORMAL LOW (ref 36.0–46.0)
Hemoglobin: 9.2 g/dL — ABNORMAL LOW (ref 12.0–15.0)
MCH: 21.9 pg — ABNORMAL LOW (ref 26.0–34.0)
MCHC: 29.8 g/dL — ABNORMAL LOW (ref 30.0–36.0)
MCV: 73.4 fL — ABNORMAL LOW (ref 78.0–100.0)
Platelets: 500 10*3/uL — ABNORMAL HIGH (ref 150–400)
RBC: 4.21 MIL/uL (ref 3.87–5.11)
RDW: 15.6 % — ABNORMAL HIGH (ref 11.5–15.5)
WBC: 7.2 10*3/uL (ref 4.0–10.5)

## 2010-08-30 LAB — GLUCOSE, CAPILLARY
Glucose-Capillary: 152 mg/dL — ABNORMAL HIGH (ref 70–99)
Glucose-Capillary: 142 mg/dL — ABNORMAL HIGH (ref 70–99)
Glucose-Capillary: 147 mg/dL — ABNORMAL HIGH (ref 70–99)
Glucose-Capillary: 186 mg/dL — ABNORMAL HIGH (ref 70–99)

## 2010-08-31 LAB — GLUCOSE, CAPILLARY
Glucose-Capillary: 102 mg/dL — ABNORMAL HIGH (ref 70–99)
Glucose-Capillary: 154 mg/dL — ABNORMAL HIGH (ref 70–99)
Glucose-Capillary: 138 mg/dL — ABNORMAL HIGH (ref 70–99)

## 2010-08-31 LAB — CBC
HCT: 29.8 % — ABNORMAL LOW (ref 36.0–46.0)
Hemoglobin: 8.8 g/dL — ABNORMAL LOW (ref 12.0–15.0)
MCH: 22.1 pg — ABNORMAL LOW (ref 26.0–34.0)
MCHC: 29.5 g/dL — ABNORMAL LOW (ref 30.0–36.0)
MCV: 74.7 fL — ABNORMAL LOW (ref 78.0–100.0)
Platelets: 478 10*3/uL — ABNORMAL HIGH (ref 150–400)
RBC: 3.99 MIL/uL (ref 3.87–5.11)
RDW: 15.8 % — ABNORMAL HIGH (ref 11.5–15.5)
WBC: 9.7 10*3/uL (ref 4.0–10.5)

## 2010-08-31 LAB — COMPREHENSIVE METABOLIC PANEL
ALT: 22 U/L (ref 0–35)
AST: 27 U/L (ref 0–37)
Albumin: 2.4 g/dL — ABNORMAL LOW (ref 3.5–5.2)
Alkaline Phosphatase: 75 U/L (ref 39–117)
BUN: 9 mg/dL (ref 6–23)
CO2: 35 mEq/L — ABNORMAL HIGH (ref 19–32)
Calcium: 8.6 mg/dL (ref 8.4–10.5)
Chloride: 95 mEq/L — ABNORMAL LOW (ref 96–112)
Creatinine, Ser: 0.88 mg/dL (ref 0.4–1.2)
GFR calc Af Amer: >60 mL/min (ref >60)
GFR calc non Af Amer: >60 mL/min (ref >60)
Glucose, Bld: 88 mg/dL (ref 70–99)
Potassium: 3.7 mEq/L (ref 3.5–5.1)
Sodium: 138 mEq/L (ref 135–145)
Total Bilirubin: 0.5 mg/dL (ref 0.3–1.2)
Total Protein: 6.2 g/dL (ref 6.0–8.3)

## 2010-08-31 NOTE — Consult Note (Signed)
NAMEROBERTO, HLAVATY               ACCOUNT NO.:  [id number]  MEDICAL RECORD NO.:  [id number]           PATIENT TYPE:  I  LOCATION:  1241                         FACILITY:  [facility]  PHYSICIAN:  Almond Lint, MD       DATE OF BIRTH:  January 03, 1946  DATE OF CONSULTATION:  08/15/2010 DATE OF DISCHARGE:                                CONSULTATION   TIME OF CONSULTATION:  1400 p.m.  REQUESTING PHYSICIAN:  [name], M.D.  CONSULTING SURGEON:  Almond Lint, MD  PRIMARY CARE PHYSICIAN:  Maryelizabeth Rowan, M.D.  REASON FOR CONSULTATION:  Biliary pancreatitis.  HISTORY OF PRESENT ILLNESS:  [name] is a 65 year old black female with a complex past medical history, who began having upper abdominal pain with associated nausea and vomiting on Saturday night after eating [employer].  Her sister brought her here to the emergency department where she was found to have biliary pancreatitis.  The sister provided this information as currently the patient is very somnolent with increased work of breathing.  Upon admission, the patient was also found to have an elevation in her troponin and was thought to have had a NSTEMI.  The patient also has a history of obstructive sleep apnea and is currently now hypercarbic with increased work of breathing.  We have been asked to evaluate the patient for possible laparoscopic cholecystectomy given diagnosis of biliary pancreatitis.  REVIEW OF SYSTEMS:  All 11 systems were unable to be reviewed given the patient's somnolent status.  However, she is able to tell me that she is currently having some upper abdominal pain, but surprisingly enough denies shortness of breath.  FAMILY HISTORY:  Noncontributory.  PAST MEDICAL HISTORY:  Per E-Chart reveals: 1. Diabetes mellitus. 2. Hypertension. 3. Seizure disorder. 4. Hyperlipidemia. 5. Chronic back pain. 6. History of diverticulitis. 7. Obstructive sleep apnea. 8. Morbid obesity. 9. Empty sella on  MRI.  PAST SURGICAL HISTORY: 1. Status post hysterectomy. 2. Appendectomy. 3. Tubal ligation. 4. Breast biopsy.  SOCIAL HISTORY:  I believe the patient lives with her sister and brother- in-law.  She denies any alcohol or tobacco.  She is currently unemployed.  ALLERGIES: 1. CODEINE. 2. PENICILLIN. 3. SULFA.  MEDICATIONS AT HOME:  Include lamotrigine, gabapentin, metoprolol, metformin, Zoloft, simvastatin, Abilify, lisinopril, enteric-coated aspirin, oxycodone, ibuprofen, and etodolac.  PHYSICAL EXAMINATION:  GENERAL:  [name] is a 65 year old morbidly obese black female who is currently somewhat somnolent with an increased work of breathing. VITAL SIGNS:  Temperature 98.1, pulse is 90, blood pressure is 114/53, respirations 26 with 94% oxygen saturations on BiPAP. HEENT:  Head is normocephalic, atraumatic.  Sclerae noninjected.  Pupils are equal, round, and reactive to light.  Ears and nose without any obvious masses or lesions.  No rhinorrhea.  She does have a nasal cannula in place.  Otherwise, mouth is pink. HEART:  Regular rate and rhythm.  Normal S1, S2.  No murmurs, gallops, or rubs are noted.  She does have palpable carotid, radial, and pedal pulses bilaterally. LUNGS:  The patient has some expiratory wheezing.  She is definitely having an increased work of breathing using all accessory  muscles. ABDOMEN:  Soft, but distended.  She has hypoactive bowel sounds and is tender throughout the upper portion of her abdomen.  Otherwise, no masses or hernias are noted.  She does currently not have any guarding or peritoneal signs. EXTREMITIES:  All 4 extremities are symmetrical with no cyanosis or clubbing. NEURO:  Unable to be evaluated at this time. PSYCH:  Also unable to be evaluated at this time.  LABORATORY DATA:  Current pH is 7.3, PCO2 is 58.5, PO2 is 74.7.  Sodium 139, potassium 4.7, glucose 199, BUN 15, creatinine 1.10, total bilirubin 0.8, alkaline  phosphatase 97, AST 64, ALT 105.  White blood cell count 18,700, hemoglobin 11.5, hematocrit 37.3, platelet count is 211,000.  The patient has currently had 450 cc of urine output since this morning.  DIAGNOSTICS:  Ultrasound of the abdomen reveals multiple mobile gallstones with no gallbladder wall thickening or pericholecystic fluid. CT scan of the abdomen reveals acute pancreatitis with fluid surrounding the pancreas and extends inferiorly within the anterior pararenal space bilaterally as well as in the transverse mesocolon.  No definite areas of pancreatic necrosis are seen.  There is also mild cardiomegaly.  IMPRESSION: 1. Biliary pancreatitis. 2. Respiratory failure/obstructive sleep apnea. 3. Non-ST-elevation myocardial infarction. 4. Hypertension. 5. Diabetes mellitus.  PLAN:  At this time, I have discussed this patient with Dr. Donell Beers.  I have also discussed this patient with Dr. [place] of Cardiology.  He would currently prefer holding off on any type of surgical intervention at this time given the patient's NSTEMI and decrease in ejection fraction.  The patient is currently also hypercarbic and in respiratory distress.  To proceed with a laparoscopic cholecystectomy is not urgent and would, therefore, wait to proceed until the patient becomes more medically and cardiac wise stable.  Typically after myocardial infarctions, we usually wait 2-3 months prior to surgical intervention. Given the patient's recent CT scan, she is at increased risk for pancreatic complications such as a pseudocyst.  If the patient continues to worsen and has an increase in her white blood cell count or begins to run worsening fevers, a repeat CT scan may be needed as well as a change from Avelox to Primaxin to better cover the pancreas.  If the patient continues in respiratory failure and is unable to eat, it may be needed that the patient requires a PICC line as well as TNA for  nutritional purposes.  If she does not develop an ileus and is unable to take anything p.o., then the patient may also be able to have a postpyloric feeding tube placed in the duodenum and tube feedings may be started. Given the patient does not need anything acutely surgical, we will reevaluate the patient towards the end of the week.  We would otherwise recommend repeating a lipase level in the morning.     Letha Cape, PA   ______________________________ Almond Lint, MD    KEO/MEDQ  D:  08/15/2010  T:  08/16/2010  Job:  [id number]  cc:   [name], M.D.  Cassell Clement, M.D. Fax: [phone]  Peter M. [place], M.D. Fax: [phone]  Rachael Fee, MD [address] [place], [place] [address]  Maryelizabeth Rowan, M.D. Fax: [phone]  Electronically Signed by Barnetta Chapel PA on 08/18/2010 07:16:45 AM Electronically Signed by Almond Lint MD on 08/30/2010 01:29:05 PM

## 2010-09-01 LAB — COMPREHENSIVE METABOLIC PANEL
ALT: 26 U/L (ref 0–35)
AST: 29 U/L (ref 0–37)
Albumin: 2.5 g/dL — ABNORMAL LOW (ref 3.5–5.2)
Alkaline Phosphatase: 66 U/L (ref 39–117)
BUN: 10 mg/dL (ref 6–23)
CO2: 32 mEq/L (ref 19–32)
Calcium: 8.5 mg/dL (ref 8.4–10.5)
Chloride: 93 mEq/L — ABNORMAL LOW (ref 96–112)
Creatinine, Ser: 0.88 mg/dL (ref 0.4–1.2)
GFR calc Af Amer: >60 mL/min (ref >60)
GFR calc non Af Amer: >60 mL/min (ref >60)
Glucose, Bld: 93 mg/dL (ref 70–99)
Potassium: 3.8 mEq/L (ref 3.5–5.1)
Sodium: 132 mEq/L — ABNORMAL LOW (ref 135–145)
Total Bilirubin: 0.5 mg/dL (ref 0.3–1.2)
Total Protein: 6.5 g/dL (ref 6.0–8.3)

## 2010-09-01 LAB — CARDIAC PANEL(CRET KIN+CKTOT+MB+TROPI)
CK, MB: 1 ng/mL (ref 0.3–4.0)
Relative Index: INVALID (ref 0.0–2.5)
Total CK: 73 U/L (ref 7–177)
Troponin I: 0.02 ng/mL (ref 0.00–0.06)

## 2010-09-01 LAB — GLUCOSE, CAPILLARY
Glucose-Capillary: 170 mg/dL — ABNORMAL HIGH (ref 70–99)
Glucose-Capillary: 79 mg/dL (ref 70–99)
Glucose-Capillary: 111 mg/dL — ABNORMAL HIGH (ref 70–99)
Glucose-Capillary: 117 mg/dL — ABNORMAL HIGH (ref 70–99)
Glucose-Capillary: 131 mg/dL — ABNORMAL HIGH (ref 70–99)

## 2010-09-01 LAB — CBC
HCT: 29.3 % — ABNORMAL LOW (ref 36.0–46.0)
Hemoglobin: 8.7 g/dL — ABNORMAL LOW (ref 12.0–15.0)
MCH: 22.1 pg — ABNORMAL LOW (ref 26.0–34.0)
MCHC: 29.7 g/dL — ABNORMAL LOW (ref 30.0–36.0)
MCV: 74.6 fL — ABNORMAL LOW (ref 78.0–100.0)
Platelets: 398 10*3/uL (ref 150–400)
RBC: 3.93 MIL/uL (ref 3.87–5.11)
RDW: 15.7 % — ABNORMAL HIGH (ref 11.5–15.5)
WBC: 7.7 10*3/uL (ref 4.0–10.5)

## 2010-09-02 ENCOUNTER — Inpatient Hospital Stay (HOSPITAL_COMMUNITY): Payer: Medicare HMO

## 2010-09-02 LAB — COMPREHENSIVE METABOLIC PANEL
ALT: 23 U/L (ref 0–35)
AST: 22 U/L (ref 0–37)
Albumin: 2.3 g/dL — ABNORMAL LOW (ref 3.5–5.2)
Alkaline Phosphatase: 77 U/L (ref 39–117)
BUN: 8 mg/dL (ref 6–23)
CO2: 33 mEq/L — ABNORMAL HIGH (ref 19–32)
Calcium: 8.5 mg/dL (ref 8.4–10.5)
Chloride: 92 mEq/L — ABNORMAL LOW (ref 96–112)
Creatinine, Ser: 0.96 mg/dL (ref 0.4–1.2)
GFR calc Af Amer: >60 mL/min (ref >60)
GFR calc non Af Amer: 59 mL/min — ABNORMAL LOW (ref >60)
Glucose, Bld: 98 mg/dL (ref 70–99)
Potassium: 3.6 mEq/L (ref 3.5–5.1)
Sodium: 131 mEq/L — ABNORMAL LOW (ref 135–145)
Total Bilirubin: 0.4 mg/dL (ref 0.3–1.2)
Total Protein: 6.1 g/dL (ref 6.0–8.3)

## 2010-09-02 LAB — GLUCOSE, CAPILLARY
Glucose-Capillary: 68 mg/dL — ABNORMAL LOW (ref 70–99)
Glucose-Capillary: 91 mg/dL (ref 70–99)
Glucose-Capillary: 134 mg/dL — ABNORMAL HIGH (ref 70–99)
Glucose-Capillary: 75 mg/dL (ref 70–99)
Glucose-Capillary: 147 mg/dL — ABNORMAL HIGH (ref 70–99)

## 2010-09-02 LAB — CARDIAC PANEL(CRET KIN+CKTOT+MB+TROPI)
CK, MB: 1 ng/mL (ref 0.3–4.0)
CK, MB: 3.3 ng/mL (ref 0.3–4.0)
CK, MB: 4.5 ng/mL — ABNORMAL HIGH (ref 0.3–4.0)
Relative Index: INVALID (ref 0.0–2.5)
Relative Index: INVALID (ref 0.0–2.5)
Relative Index: INVALID (ref 0.0–2.5)
Total CK: 64 U/L (ref 7–177)
Total CK: 70 U/L (ref 7–177)
Total CK: 77 U/L (ref 7–177)
Troponin I: 0.03 ng/mL (ref 0.00–0.06)
Troponin I: 0.23 ng/mL — ABNORMAL HIGH (ref 0.00–0.06)
Troponin I: 0.62 ng/mL (ref 0.00–0.06)

## 2010-09-02 LAB — CBC
HCT: 26.8 % — ABNORMAL LOW (ref 36.0–46.0)
Hemoglobin: 8.1 g/dL — ABNORMAL LOW (ref 12.0–15.0)
MCH: 22.4 pg — ABNORMAL LOW (ref 26.0–34.0)
MCHC: 30.2 g/dL (ref 30.0–36.0)
MCV: 74 fL — ABNORMAL LOW (ref 78.0–100.0)
Platelets: 338 10*3/uL (ref 150–400)
RBC: 3.62 MIL/uL — ABNORMAL LOW (ref 3.87–5.11)
RDW: 15.6 % — ABNORMAL HIGH (ref 11.5–15.5)
WBC: 5.1 10*3/uL (ref 4.0–10.5)

## 2010-09-02 MED ORDER — IOHEXOL 300 MG/ML  SOLN: 100.0000 mL | Freq: Once | INTRAMUSCULAR | Status: AC | PRN

## 2010-09-02 NOTE — Progress Notes (Signed)
NAME:  EDDIS, PINGLETON NO.:  [id number]  MEDICAL RECORD NO.:  [id number]           PATIENT TYPE:  I  LOCATION:  1405                         FACILITY:  [facility]  PHYSICIAN:  Mauro Kaufmann, MD         DATE OF BIRTH:  20-Feb-1946                                PROGRESS NOTE   This is the progress note for the patient, [name], from August 23, 2010 to August 29, 2010.  CURRENT DIAGNOSES: 1. Non-ST-elevation myocardial infarction with negative Myoview.  The     patient cleared for surgery as per Cardiology. 2. Chronic respiratory failure, stable. 3. Gallstone pancreatitis, laparoscopic cholecystectomy performed for     a.m. on August 30, 2010, as per Surgery. 4. Diabetes mellitus, uncontrolled with dose of Lantus and NovoLog     adjusted. 5. Hypertension. 6. History of seizure disorder. 7. Hyperlipidemia. 8. Diastolic heart failure, currently very good diuretic response.  PROCEDURES: Tests performed during this time period include, 1. Myoview on August 25, 2010, showed probable normal Lexiscan Myoview,     mild reversible defect in the anterior wall, which appears to be     breast attenuation, however, cannot completely exclude ischemia. 2. Chest x-ray on August 25, 2010, showed cardiomegaly with retrocardiac     collapse/consolidation and small bilateral pleural effusions.     Pulmonary vascular congestion.  PERTINENT LABS: The patient's last BNP is 99.1.  Her BNP as of August 27, 2010, was 193. The patient's hemoglobin 9.5, hematocrit 30.6, BUN is 15, creatinine 0.99.  PHYSICAL EXAMINATION: CHEST:  Bibasilar crackles to auscultation. HEART:  S1 and S2, regular in rate and rhythm. ABDOMEN:  Soft and nontender. EXTREMITIES:  There is 1+ edema bilaterally.  BRIEF HISTORY AND PHYSICAL: Please refer to the progress note from Dr. [name] and also the history and physical from August 13, 2010.  In brief, a 65 year old female with a history of chronic respiratory  failure, came with nausea and vomiting and epigastric pain.  The patient was found to have gallstone pancreatitis and the Surgery was consulted.  HOSPITAL COURSE: 1. Diastolic heart failure.  The patient was found to be in diastolic     heart failure with elevated BNP.  She was given high-dose of Lasix     and has excellent diuretic response with Lasix.  The patient has     improved and at this time I have cut down the Lasix to 40 mg IV     daily.  The repeat BNP as of today is 99.1. 2. Non-ST-elevation MI.  The patient was seen by Cardiology and     underwent Myoview and the Myoview is negative.  Cardiology is     cleared the patient for surgery in a.m. 3. Gallstone pancreatitis.  The patient is going to have a     laparoscopic cholecystectomy to be performed in a.m. 4. Hypercarbic respiratory failure.  The patient is currently on nasal     cannula 2 L and O2 saturations are 97%.  Vitals today are     temperature 98.7, pulse 74, respiration 18, blood pressure 115/68. 5. Diabetes  mellitus.  The patient's blood sugar has been     uncontrolled.  Despite changing the dose of Lantus, I am going to     add more coverage of 3 units subcu and also going to increase the     dose of Lantus to 40 units subcu daily.  In the meantime, the     patient will be continued on Bystolic 10 mg p.o. daily, which will     be given before surgery.     Mauro Kaufmann, MD     GL/MEDQ  D:  08/29/2010  T:  08/29/2010  Job:  [id number]  Electronically Signed by Mauro Kaufmann  on 09/02/2010 12:16:18 PM

## 2010-09-03 LAB — COMPREHENSIVE METABOLIC PANEL
ALT: 20 U/L (ref 0–35)
AST: 21 U/L (ref 0–37)
Albumin: 2.6 g/dL — ABNORMAL LOW (ref 3.5–5.2)
Alkaline Phosphatase: 89 U/L (ref 39–117)
BUN: 4 mg/dL — ABNORMAL LOW (ref 6–23)
CO2: 34 mEq/L — ABNORMAL HIGH (ref 19–32)
Calcium: 9 mg/dL (ref 8.4–10.5)
Chloride: 95 mEq/L — ABNORMAL LOW (ref 96–112)
Creatinine, Ser: 0.94 mg/dL (ref 0.4–1.2)
GFR calc Af Amer: >60 mL/min (ref >60)
GFR calc non Af Amer: >60 mL/min (ref >60)
Glucose, Bld: 65 mg/dL — ABNORMAL LOW (ref 70–99)
Potassium: 4.5 mEq/L (ref 3.5–5.1)
Sodium: 136 mEq/L (ref 135–145)
Total Bilirubin: 0.4 mg/dL (ref 0.3–1.2)
Total Protein: 6.9 g/dL (ref 6.0–8.3)

## 2010-09-03 LAB — CARDIAC PANEL(CRET KIN+CKTOT+MB+TROPI)
CK, MB: 3.6 ng/mL (ref 0.3–4.0)
CK, MB: 2.7 ng/mL (ref 0.3–4.0)
CK, MB: 1.7 ng/mL (ref 0.3–4.0)
Relative Index: INVALID (ref 0.0–2.5)
Relative Index: INVALID (ref 0.0–2.5)
Relative Index: INVALID (ref 0.0–2.5)
Total CK: 78 U/L (ref 7–177)
Total CK: 71 U/L (ref 7–177)
Total CK: 53 U/L (ref 7–177)
Troponin I: 0.44 ng/mL — ABNORMAL HIGH (ref 0.00–0.06)
Troponin I: 0.4 ng/mL — ABNORMAL HIGH (ref 0.00–0.06)
Troponin I: 0.27 ng/mL — ABNORMAL HIGH (ref 0.00–0.06)

## 2010-09-03 LAB — CBC
HCT: 30.6 % — ABNORMAL LOW (ref 36.0–46.0)
Hemoglobin: 9 g/dL — ABNORMAL LOW (ref 12.0–15.0)
MCH: 22.1 pg — ABNORMAL LOW (ref 26.0–34.0)
MCHC: 29.4 g/dL — ABNORMAL LOW (ref 30.0–36.0)
MCV: 75.2 fL — ABNORMAL LOW (ref 78.0–100.0)
Platelets: 376 10*3/uL (ref 150–400)
RBC: 4.07 MIL/uL (ref 3.87–5.11)
RDW: 15.7 % — ABNORMAL HIGH (ref 11.5–15.5)
WBC: 6.6 10*3/uL (ref 4.0–10.5)

## 2010-09-03 LAB — GLUCOSE, CAPILLARY
Glucose-Capillary: 67 mg/dL — ABNORMAL LOW (ref 70–99)
Glucose-Capillary: 81 mg/dL (ref 70–99)
Glucose-Capillary: 122 mg/dL — ABNORMAL HIGH (ref 70–99)
Glucose-Capillary: 57 mg/dL — ABNORMAL LOW (ref 70–99)
Glucose-Capillary: 77 mg/dL (ref 70–99)
Glucose-Capillary: 73 mg/dL (ref 70–99)

## 2010-09-03 MED ORDER — IOHEXOL 300 MG/ML  SOLN
100.0000 mL | Freq: Once | INTRAMUSCULAR | Status: AC | PRN
  Administered 2010-09-03: 100 mL via INTRAVENOUS

## 2010-09-04 LAB — COMPREHENSIVE METABOLIC PANEL
ALT: 56 U/L — ABNORMAL HIGH (ref 0–35)
AST: 66 U/L — ABNORMAL HIGH (ref 0–37)
Albumin: 2.7 g/dL — ABNORMAL LOW (ref 3.5–5.2)
Alkaline Phosphatase: 224 U/L — ABNORMAL HIGH (ref 39–117)
BUN: 6 mg/dL (ref 6–23)
CO2: 34 mEq/L — ABNORMAL HIGH (ref 19–32)
Calcium: 9 mg/dL (ref 8.4–10.5)
Chloride: 93 mEq/L — ABNORMAL LOW (ref 96–112)
Creatinine, Ser: 1.05 mg/dL (ref 0.4–1.2)
GFR calc Af Amer: >60 mL/min (ref >60)
GFR calc non Af Amer: 53 mL/min — ABNORMAL LOW (ref >60)
Glucose, Bld: 81 mg/dL (ref 70–99)
Potassium: 4.5 mEq/L (ref 3.5–5.1)
Sodium: 135 mEq/L (ref 135–145)
Total Bilirubin: 0.3 mg/dL (ref 0.3–1.2)
Total Protein: 6.7 g/dL (ref 6.0–8.3)

## 2010-09-04 LAB — GLUCOSE, CAPILLARY
Glucose-Capillary: 84 mg/dL (ref 70–99)
Glucose-Capillary: 126 mg/dL — ABNORMAL HIGH (ref 70–99)
Glucose-Capillary: 131 mg/dL — ABNORMAL HIGH (ref 70–99)
Glucose-Capillary: 143 mg/dL — ABNORMAL HIGH (ref 70–99)

## 2010-09-04 LAB — CBC
HCT: 29.7 % — ABNORMAL LOW (ref 36.0–46.0)
Hemoglobin: 8.5 g/dL — ABNORMAL LOW (ref 12.0–15.0)
MCH: 21.5 pg — ABNORMAL LOW (ref 26.0–34.0)
MCHC: 28.6 g/dL — ABNORMAL LOW (ref 30.0–36.0)
MCV: 75.2 fL — ABNORMAL LOW (ref 78.0–100.0)
Platelets: 328 10*3/uL (ref 150–400)
RBC: 3.95 MIL/uL (ref 3.87–5.11)
RDW: 15.9 % — ABNORMAL HIGH (ref 11.5–15.5)
WBC: 5.9 10*3/uL (ref 4.0–10.5)

## 2010-09-05 ENCOUNTER — Ambulatory Visit (HOSPITAL_COMMUNITY)
Admission: RE | Admit: 2010-09-05 | Discharge: 2010-09-05 | Disposition: A | Discharge status: Home / Self Care | Payer: Medicare HMO | Source: Ambulatory Visit | Attending: Cardiology | Admitting: Cardiology

## 2010-09-05 LAB — CBC
HCT: 29.8 % — ABNORMAL LOW (ref 36.0–46.0)
Hemoglobin: 8.7 g/dL — ABNORMAL LOW (ref 12.0–15.0)
MCH: 22.1 pg — ABNORMAL LOW (ref 26.0–34.0)
MCHC: 29.2 g/dL — ABNORMAL LOW (ref 30.0–36.0)
MCV: 75.6 fL — ABNORMAL LOW (ref 78.0–100.0)
Platelets: 302 10*3/uL (ref 150–400)
RBC: 3.94 MIL/uL (ref 3.87–5.11)
RDW: 15.8 % — ABNORMAL HIGH (ref 11.5–15.5)
WBC: 4.1 10*3/uL (ref 4.0–10.5)

## 2010-09-05 LAB — PROTIME-INR
INR: 1.14 (ref 0.00–1.49)
Prothrombin Time: 14.8 seconds (ref 11.6–15.2)

## 2010-09-05 LAB — GLUCOSE, CAPILLARY
Glucose-Capillary: 127 mg/dL — ABNORMAL HIGH (ref 70–99)
Glucose-Capillary: 112 mg/dL — ABNORMAL HIGH (ref 70–99)

## 2010-09-05 LAB — COMPREHENSIVE METABOLIC PANEL
ALT: 42 U/L — ABNORMAL HIGH (ref 0–35)
AST: 40 U/L — ABNORMAL HIGH (ref 0–37)
Albumin: 2.6 g/dL — ABNORMAL LOW (ref 3.5–5.2)
Alkaline Phosphatase: 192 U/L — ABNORMAL HIGH (ref 39–117)
BUN: 7 mg/dL (ref 6–23)
CO2: 33 mEq/L — ABNORMAL HIGH (ref 19–32)
Calcium: 8.8 mg/dL (ref 8.4–10.5)
Chloride: 96 mEq/L (ref 96–112)
Creatinine, Ser: 0.82 mg/dL (ref 0.4–1.2)
GFR calc Af Amer: >60 mL/min (ref >60)
GFR calc non Af Amer: >60 mL/min (ref >60)
Glucose, Bld: 139 mg/dL — ABNORMAL HIGH (ref 70–99)
Potassium: 4.2 mEq/L (ref 3.5–5.1)
Sodium: 136 mEq/L (ref 135–145)
Total Bilirubin: 0.4 mg/dL (ref 0.3–1.2)
Total Protein: 6.7 g/dL (ref 6.0–8.3)

## 2010-09-05 LAB — HEMOGLOBIN A1C
Hgb A1c MFr Bld: 7.7 % — ABNORMAL HIGH (ref <5.7)
Mean Plasma Glucose: 174 mg/dL — ABNORMAL HIGH (ref <117)

## 2010-09-05 LAB — PLATELET COUNT: Platelets: 281 10*3/uL (ref 150–400)

## 2010-09-05 LAB — HEPARIN LEVEL (UNFRACTIONATED): Heparin Unfractionated: <0.1 IU/mL — ABNORMAL LOW (ref 0.30–0.70)

## 2010-09-06 LAB — BASIC METABOLIC PANEL
BUN: 5 mg/dL — ABNORMAL LOW (ref 6–23)
CO2: 32 mEq/L (ref 19–32)
Calcium: 8.7 mg/dL (ref 8.4–10.5)
Chloride: 101 mEq/L (ref 96–112)
Creatinine, Ser: 0.81 mg/dL (ref 0.4–1.2)
GFR calc Af Amer: >60 mL/min (ref >60)
GFR calc non Af Amer: >60 mL/min (ref >60)
Glucose, Bld: 138 mg/dL — ABNORMAL HIGH (ref 70–99)
Potassium: 4.3 mEq/L (ref 3.5–5.1)
Sodium: 141 mEq/L (ref 135–145)

## 2010-09-06 LAB — GLUCOSE, CAPILLARY
Glucose-Capillary: 109 mg/dL — ABNORMAL HIGH (ref 70–99)
Glucose-Capillary: 188 mg/dL — ABNORMAL HIGH (ref 70–99)
Glucose-Capillary: 139 mg/dL — ABNORMAL HIGH (ref 70–99)
Glucose-Capillary: 163 mg/dL — ABNORMAL HIGH (ref 70–99)
Glucose-Capillary: 138 mg/dL — ABNORMAL HIGH (ref 70–99)
Glucose-Capillary: 140 mg/dL — ABNORMAL HIGH (ref 70–99)

## 2010-09-06 LAB — CBC
HCT: 29.3 % — ABNORMAL LOW (ref 36.0–46.0)
Hemoglobin: 8.5 g/dL — ABNORMAL LOW (ref 12.0–15.0)
MCH: 21.9 pg — ABNORMAL LOW (ref 26.0–34.0)
MCHC: 29 g/dL — ABNORMAL LOW (ref 30.0–36.0)
MCV: 75.3 fL — ABNORMAL LOW (ref 78.0–100.0)
Platelets: 262 10*3/uL (ref 150–400)
RBC: 3.89 MIL/uL (ref 3.87–5.11)
RDW: 16 % — ABNORMAL HIGH (ref 11.5–15.5)
WBC: 3.9 10*3/uL — ABNORMAL LOW (ref 4.0–10.5)

## 2010-09-06 LAB — COMPREHENSIVE METABOLIC PANEL
ALT: 34 U/L (ref 0–35)
AST: 29 U/L (ref 0–37)
Albumin: 2.5 g/dL — ABNORMAL LOW (ref 3.5–5.2)
Alkaline Phosphatase: 181 U/L — ABNORMAL HIGH (ref 39–117)
BUN: 4 mg/dL — ABNORMAL LOW (ref 6–23)
CO2: 34 mEq/L — ABNORMAL HIGH (ref 19–32)
Calcium: 8.7 mg/dL (ref 8.4–10.5)
Chloride: 99 mEq/L (ref 96–112)
Creatinine, Ser: 0.8 mg/dL (ref 0.4–1.2)
GFR calc Af Amer: >60 mL/min (ref >60)
GFR calc non Af Amer: >60 mL/min (ref >60)
Glucose, Bld: 136 mg/dL — ABNORMAL HIGH (ref 70–99)
Potassium: 4.3 mEq/L (ref 3.5–5.1)
Sodium: 140 mEq/L (ref 135–145)
Total Bilirubin: 0.5 mg/dL (ref 0.3–1.2)
Total Protein: 6.4 g/dL (ref 6.0–8.3)

## 2010-09-07 LAB — GLUCOSE, CAPILLARY
Glucose-Capillary: 136 mg/dL — ABNORMAL HIGH (ref 70–99)
Glucose-Capillary: 178 mg/dL — ABNORMAL HIGH (ref 70–99)
Glucose-Capillary: 119 mg/dL — ABNORMAL HIGH (ref 70–99)
Glucose-Capillary: 139 mg/dL — ABNORMAL HIGH (ref 70–99)

## 2010-09-07 LAB — COMPREHENSIVE METABOLIC PANEL
ALT: 39 U/L — ABNORMAL HIGH (ref 0–35)
AST: 43 U/L — ABNORMAL HIGH (ref 0–37)
Albumin: 2.7 g/dL — ABNORMAL LOW (ref 3.5–5.2)
Alkaline Phosphatase: 221 U/L — ABNORMAL HIGH (ref 39–117)
BUN: 3 mg/dL — ABNORMAL LOW (ref 6–23)
CO2: 31 mEq/L (ref 19–32)
Calcium: 8.8 mg/dL (ref 8.4–10.5)
Chloride: 100 mEq/L (ref 96–112)
Creatinine, Ser: 0.83 mg/dL (ref 0.4–1.2)
GFR calc Af Amer: >60 mL/min (ref >60)
GFR calc non Af Amer: >60 mL/min (ref >60)
Glucose, Bld: 157 mg/dL — ABNORMAL HIGH (ref 70–99)
Potassium: 4 mEq/L (ref 3.5–5.1)
Sodium: 140 mEq/L (ref 135–145)
Total Bilirubin: 0.3 mg/dL (ref 0.3–1.2)
Total Protein: 6.8 g/dL (ref 6.0–8.3)

## 2010-09-07 LAB — URINALYSIS, MICROSCOPIC ONLY
Bilirubin Urine: NEGATIVE
Glucose, UA: NEGATIVE mg/dL
Hgb urine dipstick: NEGATIVE
Ketones, ur: NEGATIVE mg/dL
Leukocytes, UA: NEGATIVE
Nitrite: NEGATIVE
Protein, ur: NEGATIVE mg/dL
Specific Gravity, Urine: 1.011 (ref 1.005–1.030)
Urine-Other: NONE SEEN
Urobilinogen, UA: 1 mg/dL (ref 0.0–1.0)
pH: 7 (ref 5.0–8.0)

## 2010-09-07 LAB — CBC
HCT: 29 % — ABNORMAL LOW (ref 36.0–46.0)
Hemoglobin: 8.6 g/dL — ABNORMAL LOW (ref 12.0–15.0)
MCH: 22.3 pg — ABNORMAL LOW (ref 26.0–34.0)
MCHC: 29.7 g/dL — ABNORMAL LOW (ref 30.0–36.0)
MCV: 75.1 fL — ABNORMAL LOW (ref 78.0–100.0)
Platelets: 268 10*3/uL (ref 150–400)
RBC: 3.86 MIL/uL — ABNORMAL LOW (ref 3.87–5.11)
RDW: 15.9 % — ABNORMAL HIGH (ref 11.5–15.5)
WBC: 4.7 10*3/uL (ref 4.0–10.5)

## 2010-09-08 LAB — COMPREHENSIVE METABOLIC PANEL
ALT: 34 U/L (ref 0–35)
AST: 35 U/L (ref 0–37)
Albumin: 2.5 g/dL — ABNORMAL LOW (ref 3.5–5.2)
Alkaline Phosphatase: 195 U/L — ABNORMAL HIGH (ref 39–117)
BUN: 1 mg/dL — ABNORMAL LOW (ref 6–23)
CO2: 32 mEq/L (ref 19–32)
Calcium: 8.4 mg/dL (ref 8.4–10.5)
Chloride: 99 mEq/L (ref 96–112)
Creatinine, Ser: 0.78 mg/dL (ref 0.4–1.2)
GFR calc Af Amer: >60 mL/min (ref >60)
GFR calc non Af Amer: >60 mL/min (ref >60)
Glucose, Bld: 142 mg/dL — ABNORMAL HIGH (ref 70–99)
Potassium: 3.8 mEq/L (ref 3.5–5.1)
Sodium: 140 mEq/L (ref 135–145)
Total Bilirubin: 0.4 mg/dL (ref 0.3–1.2)
Total Protein: 6.1 g/dL (ref 6.0–8.3)

## 2010-09-08 LAB — CBC
HCT: 26.8 % — ABNORMAL LOW (ref 36.0–46.0)
Hemoglobin: 8 g/dL — ABNORMAL LOW (ref 12.0–15.0)
MCH: 22.4 pg — ABNORMAL LOW (ref 26.0–34.0)
MCHC: 29.9 g/dL — ABNORMAL LOW (ref 30.0–36.0)
MCV: 75.1 fL — ABNORMAL LOW (ref 78.0–100.0)
Platelets: 243 10*3/uL (ref 150–400)
RBC: 3.57 MIL/uL — ABNORMAL LOW (ref 3.87–5.11)
RDW: 16 % — ABNORMAL HIGH (ref 11.5–15.5)
WBC: 3.8 10*3/uL — ABNORMAL LOW (ref 4.0–10.5)

## 2010-09-08 LAB — GLUCOSE, CAPILLARY
Glucose-Capillary: 142 mg/dL — ABNORMAL HIGH (ref 70–99)
Glucose-Capillary: 173 mg/dL — ABNORMAL HIGH (ref 70–99)

## 2010-09-08 NOTE — Procedures (Signed)
  NAME:  JESSI, PITSTICK NO.:  [id number]  MEDICAL RECORD NO.:  [id number]           PATIENT TYPE:  O  LOCATION:  [facility]                         FACILITY:  [facility]  PHYSICIAN:  Marca Ancona, MD      DATE OF BIRTH:  09-19-1945  DATE OF PROCEDURE:  09/05/2010 DATE OF DISCHARGE:                           CARDIAC CATHETERIZATION   SURGEON:  Marca Ancona, MD.  PROCEDURE:  Coronary angiography.  INDICATIONS:  This is a 65 year old who recently had a cholecystectomy complicated by perioperative MI with mild elevation in her troponin. She was sent today for cardiac catheterization.  PROCEDURE NOTE:  After informed consent was obtained, the patient underwent Allen testing on the right wrist.  The right ulnar artery gave good collateral circulation to the radial side of the hand. Constituting of positive Allen test, the right wrist was sterilely prepped and draped.  Lidocaine 1% used to locally anesthetize the right radial area.  The right radial artery was entered using modified Seldinger technique and a 5-French arterial sheath was placed.  The right coronary artery was engaged using the JR-4 catheter.  The left coronary artery was best engaged using the EBU 3.5 guide catheter. Ventriculography was not done as the patient had an echocardiogram this admission showing EF of 60%.  FINDINGS: 1. Hemodynamics:  Aorta 126/76. 2. Left ventriculography was not done.  EF was 60% by recent echo. 3. Right coronary artery:  The right coronary artery was a small     nondominant vessel with luminal irregularities. 4. Left main:  Left main had no angiographic disease. 5. Left circumflex system:  The left circumflex system was relatively     small with mild luminal irregularities. 6. LAD system:  The LAD was a large vessel wrapping around the apex     and supplying a large portion of the PDA territory.  There were     minimal luminal irregularities.  IMPRESSION:  This is a  65 year old who had a small perioperative myocardial infarction.  Coronary angiography shows minimal coronary artery disease.  I suspect the elevated troponin must be due to supply demand mismatch.     Marca Ancona, MD     DM/MEDQ  D:  09/05/2010  T:  09/06/2010  Job:  [id number]  Electronically Signed by Marca Ancona MD on 09/08/2010 09:35:56 PM

## 2010-09-10 LAB — GLUCOSE, CAPILLARY
Glucose-Capillary: 106 mg/dL — ABNORMAL HIGH (ref 70–99)
Glucose-Capillary: 112 mg/dL — ABNORMAL HIGH (ref 70–99)
Glucose-Capillary: 118 mg/dL — ABNORMAL HIGH (ref 70–99)
Glucose-Capillary: 135 mg/dL — ABNORMAL HIGH (ref 70–99)
Glucose-Capillary: 78 mg/dL (ref 70–99)
Glucose-Capillary: 81 mg/dL (ref 70–99)
Glucose-Capillary: 88 mg/dL (ref 70–99)
Glucose-Capillary: 88 mg/dL (ref 70–99)
Glucose-Capillary: 93 mg/dL (ref 70–99)
Glucose-Capillary: 93 mg/dL (ref 70–99)
Glucose-Capillary: 95 mg/dL (ref 70–99)
Glucose-Capillary: 95 mg/dL (ref 70–99)
Glucose-Capillary: 96 mg/dL (ref 70–99)
Glucose-Capillary: 96 mg/dL (ref 70–99)
Glucose-Capillary: 97 mg/dL (ref 70–99)
Glucose-Capillary: 97 mg/dL (ref 70–99)
Glucose-Capillary: 97 mg/dL (ref 70–99)
Glucose-Capillary: 97 mg/dL (ref 70–99)
Glucose-Capillary: 98 mg/dL (ref 70–99)
Glucose-Capillary: 99 mg/dL (ref 70–99)

## 2010-09-10 LAB — COMPREHENSIVE METABOLIC PANEL
ALT: 13 U/L (ref 0–35)
AST: 15 U/L (ref 0–37)
Albumin: 3.7 g/dL (ref 3.5–5.2)
Alkaline Phosphatase: 124 U/L — ABNORMAL HIGH (ref 39–117)
BUN: 9 mg/dL (ref 6–23)
CO2: 27 mEq/L (ref 19–32)
Calcium: 8.8 mg/dL (ref 8.4–10.5)
Chloride: 107 mEq/L (ref 96–112)
Creatinine, Ser: 0.69 mg/dL (ref 0.4–1.2)
GFR calc Af Amer: >60 mL/min (ref >60)
GFR calc non Af Amer: >60 mL/min (ref >60)
Glucose, Bld: 111 mg/dL — ABNORMAL HIGH (ref 70–99)
Potassium: 3.9 mEq/L (ref 3.5–5.1)
Sodium: 140 mEq/L (ref 135–145)
Total Bilirubin: 0.8 mg/dL (ref 0.3–1.2)
Total Protein: 7.6 g/dL (ref 6.0–8.3)

## 2010-09-10 LAB — URINALYSIS, ROUTINE W REFLEX MICROSCOPIC
Bilirubin Urine: NEGATIVE
Bilirubin Urine: NEGATIVE
Glucose, UA: NEGATIVE mg/dL
Glucose, UA: NEGATIVE mg/dL
Hgb urine dipstick: NEGATIVE
Hgb urine dipstick: NEGATIVE
Ketones, ur: NEGATIVE mg/dL
Ketones, ur: NEGATIVE mg/dL
Leukocytes, UA: NEGATIVE
Nitrite: NEGATIVE
Nitrite: NEGATIVE
Protein, ur: NEGATIVE mg/dL
Protein, ur: 30 mg/dL — AB
Specific Gravity, Urine: 1.016 (ref 1.005–1.030)
Specific Gravity, Urine: 1.009 (ref 1.005–1.030)
Urobilinogen, UA: 0.2 mg/dL (ref 0.0–1.0)
Urobilinogen, UA: 0.2 mg/dL (ref 0.0–1.0)
pH: 6.5 (ref 5.0–8.0)
pH: 7.5 (ref 5.0–8.0)

## 2010-09-10 LAB — URINE MICROSCOPIC-ADD ON

## 2010-09-10 LAB — DIFFERENTIAL
Basophils Absolute: 0 10*3/uL (ref 0.0–0.1)
Basophils Absolute: 0 10*3/uL (ref 0.0–0.1)
Basophils Relative: 0 % (ref 0–1)
Basophils Relative: 0 % (ref 0–1)
Eosinophils Absolute: 0 10*3/uL (ref 0.0–0.7)
Eosinophils Absolute: 0.1 10*3/uL (ref 0.0–0.7)
Eosinophils Relative: 1 % (ref 0–5)
Eosinophils Relative: 2 % (ref 0–5)
Lymphocytes Relative: 22 % (ref 12–46)
Lymphocytes Relative: 22 % (ref 12–46)
Lymphs Abs: 1 10*3/uL (ref 0.7–4.0)
Lymphs Abs: 1.4 10*3/uL (ref 0.7–4.0)
Monocytes Absolute: 0.2 10*3/uL (ref 0.1–1.0)
Monocytes Absolute: 0.1 10*3/uL (ref 0.1–1.0)
Monocytes Relative: 5 % (ref 3–12)
Monocytes Relative: 1 % — ABNORMAL LOW (ref 3–12)
Neutro Abs: 3.3 10*3/uL (ref 1.7–7.7)
Neutro Abs: 4.6 10*3/uL (ref 1.7–7.7)
Neutrophils Relative %: 72 % (ref 43–77)
Neutrophils Relative %: 75 % (ref 43–77)

## 2010-09-10 LAB — CBC
HCT: 39.1 % (ref 36.0–46.0)
HCT: 41.4 % (ref 36.0–46.0)
Hemoglobin: 12.7 g/dL (ref 12.0–15.0)
Hemoglobin: 13.2 g/dL (ref 12.0–15.0)
MCH: 23.5 pg — ABNORMAL LOW (ref 26.0–34.0)
MCHC: 32.5 g/dL (ref 30.0–36.0)
MCHC: 31.9 g/dL (ref 30.0–36.0)
MCV: 75.4 fL — ABNORMAL LOW (ref 78.0–100.0)
MCV: 73.7 fL — ABNORMAL LOW (ref 78.0–100.0)
Platelets: 271 10*3/uL (ref 150–400)
Platelets: 257 10*3/uL (ref 150–400)
RBC: 5.18 MIL/uL — ABNORMAL HIGH (ref 3.87–5.11)
RBC: 5.61 MIL/uL — ABNORMAL HIGH (ref 3.87–5.11)
RDW: 14.5 % (ref 11.5–15.5)
RDW: 17.4 % — ABNORMAL HIGH (ref 11.5–15.5)
WBC: 4.6 10*3/uL (ref 4.0–10.5)
WBC: 6.1 10*3/uL (ref 4.0–10.5)

## 2010-09-10 LAB — POCT I-STAT, CHEM 8
BUN: 13 mg/dL (ref 6–23)
Calcium, Ion: 1.05 mmol/L — ABNORMAL LOW (ref 1.12–1.32)
Chloride: 106 mEq/L (ref 96–112)
Creatinine, Ser: 1 mg/dL (ref 0.4–1.2)
Glucose, Bld: 101 mg/dL — ABNORMAL HIGH (ref 70–99)
HCT: 45 % (ref 36.0–46.0)
Hemoglobin: 15.3 g/dL — ABNORMAL HIGH (ref 12.0–15.0)
Potassium: 3.5 mEq/L (ref 3.5–5.1)
Sodium: 142 mEq/L (ref 135–145)
TCO2: 27 mmol/L (ref 0–100)

## 2010-09-10 LAB — RAPID URINE DRUG SCREEN, HOSP PERFORMED
Amphetamines: NOT DETECTED
Barbiturates: NOT DETECTED
Benzodiazepines: POSITIVE — AB
Cocaine: NOT DETECTED
Opiates: NOT DETECTED
Tetrahydrocannabinol: NOT DETECTED

## 2010-09-10 LAB — HEMOGLOBIN A1C
Hgb A1c MFr Bld: 6.3 % — ABNORMAL HIGH (ref 4.6–6.1)
Mean Plasma Glucose: 134 mg/dL

## 2010-09-10 LAB — POCT CARDIAC MARKERS
CKMB, poc: <1 ng/mL — ABNORMAL LOW (ref 1.0–8.0)
CKMB, poc: 1.5 ng/mL (ref 1.0–8.0)
CKMB, poc: <1 ng/mL — ABNORMAL LOW (ref 1.0–8.0)
Myoglobin, poc: 51 ng/mL (ref 12–200)
Myoglobin, poc: 106 ng/mL (ref 12–200)
Myoglobin, poc: 106 ng/mL (ref 12–200)
Troponin i, poc: <0.05 ng/mL (ref 0.00–0.09)
Troponin i, poc: <0.05 ng/mL (ref 0.00–0.09)
Troponin i, poc: <0.05 ng/mL (ref 0.00–0.09)

## 2010-09-10 LAB — AMMONIA: Ammonia: 33 umol/L (ref 11–35)

## 2010-09-10 LAB — ETHANOL: Alcohol, Ethyl (B): <5 mg/dL (ref 0–10)

## 2010-09-10 LAB — LEVETIRACETAM LEVEL: Levetiracetam Lvl: 33.1 ug/mL

## 2010-09-18 NOTE — Consult Note (Signed)
[name], [name]               ACCOUNT NO.:  [id number]  MEDICAL RECORD NO.:  [id number]           PATIENT TYPE:  I  LOCATION:  1407                         [facility]ACILITY:  [facility]  PHYSICIAN:  Cassell Clement, M.D. DATE OF BIRTH:  1945-08-17  DATE OF CONSULTATION:  08/14/2010 DATE OF DISCHARGE:                                CONSULTATION   PRIMARY CARE PHYSICIAN:  Maryelizabeth Rowan, MD  PRIMARY CARDIOLOGIST:  None (was Ricki Rodriguez, MD, in 2009).  CHIEF COMPLAINT:  Non-ST-segment elevation MI.  HISTORY OF PRESENT ILLNESS:  [name] is a 65 year old female with no history of coronary artery disease.  She was admitted yesterday with pancreatitis.  GI and Primary Care are following her.  She has elevated cardiac enzymes, consistent with a non-ST segment elevation MI, so Cardiology was asked to evaluate her.  [name] denies chest pain.  She has mostly lower abdominal pain which goes up her abdomen and sometimes into her chest.  She denies any recent chest pain with exertion.  She does admit to some wheezing with exertion.  She denies edema, but has chronic dyspnea on exertion and some orthopnea.  The nursing staff also noted that she was having problems with decreased O2 saturation secondary to narcotics and procedures.  She denies PND or palpitations.  Currently, she is very uncomfortable and nauseated with significant abdominal pain.  She has also had some bright red blood per rectum.  PAST MEDICAL HISTORY: 1. History of syncope in 2009 with cardiac catheterization showing     normal coronary arteries and normal left ventricular function. 2. Status post echocardiogram in November 2010 showing an EF of 55% to     60%. 3. Diabetes. 4. Hypertension. 5. Hyperlipidemia. 6. Family history of coronary artery disease. 7. Morbid obesity. 8. History of seizures. 9. Migraines. 10.History of diverticulitis and diverticulosis. 11.Depression. 12.History of abnormal  LFTs.  SURGICAL HISTORY:  She is status post appendectomy, hysterectomy, bilateral tubal ligation, cardiac catheterization, cyst removal, tonsillectomy, left breast biopsy, and back surgery.  ALLERGIES: 1. PENICILLIN. 2. CODEINE. 3. SULFA.  CURRENT MEDICATIONS: 1. Biotin b.i.d. 2. Abilify 5 mg a day. 3. Aspirin 100 mg PR daily. 4. Neurontin 300 mg t.i.d. 5. Heparin is on hold. 6. Sliding scale insulin. 7. Lamictal 100 mg b.i.d. 8. Lisinopril 20 mg a day. 9. Lopressor 5 mg IV q.4 hours. 10.Avelox 400 mg IV daily. 11.Nitroglycerin paste 1/2 inch q.6 hours. 12.Zofran 8 mg IV q.12 hours. 13.Protonix 40 mg IV b.i.d. 14.Zoloft 150 mg a day. 15.Normal saline at 150 mL an hour.  SOCIAL HISTORY:  She lives in [place].  She has family in the area. She is a retired Lawyer.  She has no known history of alcohol, tobacco, or drug abuse.  She was disabled secondary to recurrent seizure.  FAMILY HISTORY:  Both of her parents were in their 70s when they died and per the patient, they both had heart disease.  One sister also has heart disease.  REVIEW OF SYSTEMS:  She has not had fever, chills, or sweats.  She has occasional headaches.  She has had some shortness of breath  with exertion, chronic dyspnea on exertion and orthopnea, but denies PND or edema.  She does report some wheezing.  She has chronic back pain and arthralgias.  She has nausea, vomiting, and diarrhea with her acute illness as well as abdominal pain.  Full 14-point review of systems is otherwise negative except as stated in the HPI.  PHYSICAL EXAMINATION:  VITAL SIGNS:  Temperature is 98.4, blood pressure 100/62, pulse 89, respiratory rate 18, O2 saturation 91% on 4 L. GENERAL:  She is a well-developed, obese, African American female who appears in acute distress secondary to nausea and abdominal pain. HEENT:  Normal. NECK:  There is no lymphadenopathy, thyromegaly, bruit, or JVD noted, but the patient is unable to  hold her breath. CV:  Heart is regular in rate and rhythm with an S1 and S2 and no significant murmur, rub, or gallop is noted.  Distal pulses are intact in all 4 extremities. LUNGS:  She has crackles in her bases. SKIN:  No rashes or lesions are noted. ABDOMEN:  Soft.  She has bowel sounds, but her abdomen is very tender and no deep palpation was done. EXTREMITIES:  There is no cyanosis, clubbing, or edema noted. MUSCULOSKELETAL:  There is no joint deformity or effusions and no spine or CVA tenderness. NEUROLOGIC:  She is alert and oriented with cranial nerves II through XII grossly intact.  Chest x-ray is pending.  CT of the abdomen shows acute pancreatitis with bibasilar atelectasis or infiltrate and mild cardiomegaly.  EKG is sinus rhythm rate 96 with a new incomplete right bundle branch block.  LABORATORY VALUES:  CK-MB 174/11.8, then 220/17.1, then 247/19.1, then 287/18.7; troponin-I is 0.22, then 0.7, then 0.78, then 0.47.  Lipase was 1995 on admission, now 1101.  Total bilirubin 1.4 now 0.7, alkaline phosphatase 204 now 133, AST 574 now 128, ALT 308 now 178.  TSH within normal limits.  Hemoglobin 12.9, hematocrit 42.2, WBCs 19.6, platelets 229.  Sodium 136, potassium 4.9, chloride 105, CO2 of 24, BUN 13, creatinine 1.05, glucose 194.  IMPRESSION:  [name] was seen today by Dr. Patty Sermons, the patient evaluated and the data reviewed.  We suspect her non-ST elevation myocardial infarction is type 2 secondary to demand ischemia which is secondary to acute pancreatitis.  For now, we will check an echo to assess left ventricular function.  Myoview can be considered at a later time after she is over her acute illness.  Her EKG is nonacute.  Of note, her O2 saturation was 100% on 4 L last night and is now 91% on 4 L, so a chest x-ray that was ordered for yesterday will be moved up to today.  Primary Care is also considering transfer to step-down with multiple medical  problems and this seems appropriate.     Theodore Demark, PA-C   ______________________________ Cassell Clement, M.D.    RB/MEDQ  D:  08/14/2010  T:  08/14/2010  Job:  [id number]  Electronically Signed by Theodore Demark PA-C on 09/12/2010 04:31:16 PM Electronically Signed by Cassell Clement M.D. on 09/18/2010 12:29:54 PM

## 2010-09-19 NOTE — Discharge Summary (Signed)
NAMESHANTIKA, BERMEA               ACCOUNT NO.:  [id number]  MEDICAL RECORD NO.:  [id number]           PATIENT TYPE:  O  LOCATION:  [facility]                         FACILITY:  [facility]  PHYSICIAN:  Hartley Barefoot, MD    DATE OF BIRTH:  12/06/1945  DATE OF ADMISSION:  08/13/2010 DATE OF DISCHARGE:  09/08/2010                              DISCHARGE SUMMARY   DISCHARGE DIAGNOSES: 1. Gallstone pancreatitis status post laparoscopic cholecystectomy.     Transaminases will need a CMET to follow up. 2. Non-ST-elevation myocardial infarction with no significant     obstruction by cardiac catheterization. 3. Hypercarbic respiratory failure, multifactorial secondary to     congestive heart failure, morbid obesity, decreased abdominal     compliance in the setting of pancreatitis. 4. Acute diastolic heart failure exacerbation. 5. Leukocytosis secondary to steroid and pancreatitis. 6. Diabetes. 7. Hypertension. 8. History of seizure disorder. 9. Hyperlipidemia. 10.History of chronic back pain. 11.History of obstructive sleep apnea. 12.Empty sella on MRI in 2009. 13.History of diverticulitis in the past. 14.History of abnormal liver function tests in the past. 15.History of intermittent headache and migraine. 16.Pneumonia  BRIEF HISTORY OF PRESENT ILLNESS:  This is a 65 year old with multiple medical problems who was admitted on August 13, 2010, after abdominal pain, nausea and vomiting.  She was also having diarrhea.  On admission she had increased lipase and increased liver function tests.  Hospital course was complicated by respiratory failure hypercapnic, non-ST- segment elevation MI, and heart failure exacerbation.  STUDIES PERFORMED:  CT angiogram in March 2012 showed bilateral trace pleural effusion with associated compressive atelectasis.  Small focal consolidation at the left lung base could represent superimposed pneumonia.  Probably rounded atelectasis of the superior segment  right lower lobe.  No central acute pulmonary embolism.  She had a cardiac catheterization on September 05, 2010, that showed minimal luminal irregularity on coronary angiography.  For other studies performed, please refer to progress note dictated on March 13 and progress note dictated on March 6 and March 28.  HOSPITAL COURSE: 1. Biliary pancreatitis.  The patient was admitted to the hospital.     She was made n.p.o., IV fluids.  Surgery was consulted.  Eventually     the patient had a cholecystectomy after she was cleared by     Cardiology after a normal Myoview.  The patient now after surgery     is tolerating diet.  Abdominal pain improved.  No nausea.  She had     some increased elevation of liver function tests on March 12.     Alkaline phosphatase at 224, but over the course of days they were     trending down, now at 195 alkaline phosphatase.  AST 35, ALT 34.     She will need to follow with Dr. Carolynne Edouard for further evaluation.  They     reevaluated the patient and they recommended followup CMET.  She is     not symptomatic and liver function tests were trending down. 2. Non-ST-segment elevation secondary to demand ischemia with     increased troponin.  The patient had a Myoview that was  negative.     The patient had a cardiac catheterization September 05, 2010, that     showed minimal coronary artery disease.  We will continue with     medical management at this time, Bystolic, Imdur, and aspirin. 3. Pneumonia.  The patient developed some cough and a CT angiogram     showed questionable pneumonia.  The patient was started on Avelox.     She has five more days of antibiotics. 4. Diastolic heart failure exacerbation.  Her hospital course was     complicated by heart failure exacerbation.  She was initially     treated with IV Lasix.  Then this was transitioned to oral Lasix.     She will be discharged on 40 mg p.o. b.i.d.  She relates some     improvement of shortness of breath.  We will  discharge her on Imdur     also. 5. Diabetes.  The patient will be discharged on metformin. 6. Acute-on-chronic respiratory failure, hypercarbic respiratory     failure secondary to heart failure and morbid obesity.  The     patient, over the course of her hospitalization, decompensated and     had  respiratory failure.  She desaturated.  She was     transferred to the step-down unit.  CCM was consulted.  She was     started on NIPPV.  Over the course of her hospitalization, her     shortness of breath improved and her respiratory failure is stable.     She will be discharged on oxygen.  This will need to be     reevaluated.  She was discharged also on Lasix.  DISCHARGE MEDICATIONS: 1. Lasix 40 mg p.o. b.i.d. 2. Hydrocortisone 25 mg suppository rectally daily. 3. Imdur 30 mg p.o. daily. 4. Avelox 400 mg q.24 hours. 5. Nebivolol 10 mg p.o. daily. 6. Potassium chloride 20 mEq p.o. daily. 7. Protonix 40 mg p.o. b.i.d. 8. MiraLax 17 grams p.o. daily. 9. Abilify 5 mg p.o. every morning. 10.Aspirin 81 mg every morning. 11.Gabapentin 300 mg one capsule by mouth three times a day. 12.Lamotrigine 100 mg p.o. b.i.d. 13.Metformin one tablet by mouth at night. 14.Oxycodone 5 mg every 4 hours as needed. 15.Sertraline 50 mg three tablets by mouth every morning. 16.Ferrous sulfate 325 mg p.o. b.i.d.  MEDICATIONS STOPPED DURING THIS HOSPITALIZATION: 1. Metoprolol and simvastatin.  Simvastatin will need to be restarted     after liver function tests normalize. 2. Lisinopril needs to be restarted when blood pressure will tolerate     it. 3. Ibuprofen and etodolac was stopped.  PHYSICAL EXAMINATION:  GENERAL:  On the day of discharge the patient was in improved condition. VITAL SIGNS:  Blood pressure 125/74, respirations 20, pulse 73, temperature 98.2, saturation 96 on 2 liters.  DISCHARGE LABORATORY DATA:  Sodium 140, potassium 3.8, chloride 99, bicarbonate 32, glucose 142, BUN 1,  creatinine 0.78, bilirubin 0.4, alkaline phosphatase 195, AST 35, ALT 34, total protein 6.1, white blood cells 3.8, hemoglobin 8.0, platelet 243.  CONDITION ON DISCHARGE:  The patient was discharged in improved condition.     Hartley Barefoot, MD     BR/MEDQ  D:  09/08/2010  T:  09/08/2010  Job:  [id number]  Electronically Signed by Hartley Barefoot MD on 09/19/2010 03:21:59 PM

## 2010-09-20 NOTE — Op Note (Signed)
[name], [name]               ACCOUNT NO.:  [id number]  MEDICAL RECORD NO.:  [id number]           PATIENT TYPE:  LOCATION:                                 FACILITY:  PHYSICIAN:  Ollen Gross. Vernell Morgans, M.D. DATE OF BIRTH:  13-Dec-1945  DATE OF PROCEDURE:  08/30/2010 DATE OF DISCHARGE:                              OPERATIVE REPORT   PREOPERATIVE DIAGNOSIS:  Gallstone pancreatitis.  POSTOPERATIVE DIAGNOSIS:  Gallstone pancreatitis.  PROCEDURE:  Laparoscopic cholecystectomy.  SURGEON:  Ollen Gross. Vernell Morgans, M.D.  ASSISTANT:  Dr. Fannie Knee.  ANESTHESIA:  General endotracheal.  PROCEDURE IN DETAIL:  After informed consent was obtained, the patient was brought to the operating room and placed in the supine position on the operating room table.  After adequate induction of general anesthesia, the patient's abdomen was prepped with ChloraPrep, allowed dry, and draped in usual sterile manner.  The area below the umbilicus was infiltrated with 0.25% Marcaine.  A small incision was made with a 15-blade knife.  This incision was carried down through the subcutaneous tissue bluntly with a hemostat and [employer] retractors until the linea alba was identified.  Linea alba was incised with a 15-blade knife. Each side was grasped with Kocher clamps and elevated anteriorly.  The preperitoneal space was then probed bluntly with a hemostat until the peritoneum was opened and access was gained to the abdominal cavity.  A 0 Vicryl pursestring stitch was placed in the fascia around the opening and a Hasson cannula was placed through the opening and anchored in place with previously placed 0 Vicryl pursestring stitch.  The abdomen was then insufflated with carbon dioxide without difficulty.  A laparoscope was inserted through the Hasson cannula and the right upper quadrant was inspected.  The patient was placed in reverse Trendelenburg position, rotated with the right side up.  Next, the epigastric  region was infiltrated with 0.25% Marcaine.  A small incision was made with a 15-blade knife and a 10 mm port was placed bluntly through this incision into the abdominal cavity under direct vision.  Sites were then chosen laterally on the right side of the abdomen for placement of 5 mm ports. Each of these areas were then infiltrated with 0.25% Marcaine.  Small stab incisions were made with a 15-blade knife and 5 mm ports were placed bluntly through these incisions into the abdominal cavity under direct vision.  A blunt grasper was placed to the lateral-most 5 mm port and used to grasp dome of gallbladder and elevated anteriorly and superiorly.  Another blunt grasper was placed through the other 5 mm port and used to retract on the body and neck of the gallbladder.  A third 5 mm port had to be placed to help retract and visualize the gallbladder.  This was done.  Some fatty adhesions to the body of gallbladder were taken down by blunt dissection with the dissector. Once we were able to visualize the neck of the gallbladder area, the peritoneal reflection of this area was opened sharply with the electrocautery.  Blunt dissection was carried down in this area until the cystic duct and gallbladder neck  junction was identified.  Because of the patient's weight, we were unable to get great visualization on it, but we were able to identify the cystic duct where it joined to the gallbladder.  There was not a lot of room there so rather than taking a chance of losing the visualization that we had, we put 2 clips on the cystic duct proximally and one distally and divided the duct between the two.  Posterior to this, the cystic artery was identified and again dissected bluntly in a circumferential manner until a good window was created.  Two clips were placed proximally and one distally on the artery and the artery was divided between the two.  Next, a laparoscopic hook cautery device was used to  separate the gallbladder from liver bed. Prior to detaching the gallbladder from liver bed, the liver bed was inspected and a couple small bleeding points were coagulated with the electrocautery until the area was completely hemostatic.  The gallbladder was then detached right away from liver bed without difficulty.  A laparoscopic bag was inserted through the epigastric port.  The gallbladder was placed in the bag and the bag was sealed. The ends were then irrigated with copious amounts of saline till the effluent was clear.  A blunt grasper was placed through the lateral-most 5 mm port and out through the epigastric port was used to bring a 19- [place] round Blake drain into the abdomen and then out the lateral-most port site.  The drain was placed in the gallbladder bed.  The drain was anchored to the skin with a 3-0 nylon stitch and placed to bulb suction. The laparoscope was then moved to the epigastric port.  A gallbladder grasper was placed through the Hasson cannula and used to grasp the opening of the bag.  The bag with the gallbladder was removed with the Hasson cannula through the infraumbilical port without difficulty.  The fascial defect was closed with the previously placed 0 Vicryl pursestring stitch as well as with another interrupted 0 Vicryl stitch. Rest of ports were removed under direct vision and were found to be hemostatic.  The gas was allowed to escape.  The skin incisions were all closed with interrupted 4-0 Monocryl subcuticular stitches.  Dermabond dressings and sterile dressings were applied.  The patient tolerated procedure.  At the end of the case, all needle, sponge, and instrument counts were correct.  The patient was then awakened, taken to recovery room in stable condition.     Ollen Gross. Vernell Morgans, M.D.     PST/MEDQ  D:  09/15/2010  T:  09/16/2010  Job:  [id number]  Electronically Signed by Chevis Pretty III M.D. on 09/20/2010 03:31:56 AM

## 2010-09-27 LAB — PHENYTOIN LEVEL, TOTAL
Phenytoin Lvl: 11.2 ug/mL (ref 10.0–20.0)
Phenytoin Lvl: 13.4 ug/mL (ref 10.0–20.0)

## 2010-09-27 LAB — COMPREHENSIVE METABOLIC PANEL
ALT: 165 U/L — ABNORMAL HIGH (ref 0–35)
ALT: 217 U/L — ABNORMAL HIGH (ref 0–35)
ALT: 321 U/L — ABNORMAL HIGH (ref 0–35)
ALT: 543 U/L — ABNORMAL HIGH (ref 0–35)
ALT: 833 U/L — ABNORMAL HIGH (ref 0–35)
AST: 103 U/L — ABNORMAL HIGH (ref 0–37)
AST: 1204 U/L — ABNORMAL HIGH (ref 0–37)
AST: 347 U/L — ABNORMAL HIGH (ref 0–37)
AST: 48 U/L — ABNORMAL HIGH (ref 0–37)
AST: 49 U/L — ABNORMAL HIGH (ref 0–37)
Albumin: 2.9 g/dL — ABNORMAL LOW (ref 3.5–5.2)
Albumin: 3.1 g/dL — ABNORMAL LOW (ref 3.5–5.2)
Albumin: 3.1 g/dL — ABNORMAL LOW (ref 3.5–5.2)
Albumin: 3.1 g/dL — ABNORMAL LOW (ref 3.5–5.2)
Albumin: 3.4 g/dL — ABNORMAL LOW (ref 3.5–5.2)
Alkaline Phosphatase: 248 U/L — ABNORMAL HIGH (ref 39–117)
Alkaline Phosphatase: 255 U/L — ABNORMAL HIGH (ref 39–117)
Alkaline Phosphatase: 257 U/L — ABNORMAL HIGH (ref 39–117)
Alkaline Phosphatase: 275 U/L — ABNORMAL HIGH (ref 39–117)
Alkaline Phosphatase: 278 U/L — ABNORMAL HIGH (ref 39–117)
BUN: 2 mg/dL — ABNORMAL LOW (ref 6–23)
BUN: 2 mg/dL — ABNORMAL LOW (ref 6–23)
BUN: 2 mg/dL — ABNORMAL LOW (ref 6–23)
BUN: 3 mg/dL — ABNORMAL LOW (ref 6–23)
BUN: 8 mg/dL (ref 6–23)
CO2: 26 mEq/L (ref 19–32)
CO2: 26 mEq/L (ref 19–32)
CO2: 28 mEq/L (ref 19–32)
CO2: 28 mEq/L (ref 19–32)
CO2: 29 mEq/L (ref 19–32)
Calcium: 8.6 mg/dL (ref 8.4–10.5)
Calcium: 8.7 mg/dL (ref 8.4–10.5)
Calcium: 8.9 mg/dL (ref 8.4–10.5)
Calcium: 9 mg/dL (ref 8.4–10.5)
Calcium: 9 mg/dL (ref 8.4–10.5)
Chloride: 103 mEq/L (ref 96–112)
Chloride: 103 mEq/L (ref 96–112)
Chloride: 104 mEq/L (ref 96–112)
Chloride: 104 mEq/L (ref 96–112)
Chloride: 104 mEq/L (ref 96–112)
Creatinine, Ser: 0.57 mg/dL (ref 0.4–1.2)
Creatinine, Ser: 0.64 mg/dL (ref 0.4–1.2)
Creatinine, Ser: 0.64 mg/dL (ref 0.4–1.2)
Creatinine, Ser: 0.66 mg/dL (ref 0.4–1.2)
Creatinine, Ser: 0.75 mg/dL (ref 0.4–1.2)
GFR calc Af Amer: >60 mL/min (ref >60)
GFR calc Af Amer: >60 mL/min (ref >60)
GFR calc Af Amer: >60 mL/min (ref >60)
GFR calc Af Amer: >60 mL/min (ref >60)
GFR calc Af Amer: >60 mL/min (ref >60)
GFR calc non Af Amer: >60 mL/min (ref >60)
GFR calc non Af Amer: >60 mL/min (ref >60)
GFR calc non Af Amer: >60 mL/min (ref >60)
GFR calc non Af Amer: >60 mL/min (ref >60)
GFR calc non Af Amer: >60 mL/min (ref >60)
Glucose, Bld: 111 mg/dL — ABNORMAL HIGH (ref 70–99)
Glucose, Bld: 114 mg/dL — ABNORMAL HIGH (ref 70–99)
Glucose, Bld: 130 mg/dL — ABNORMAL HIGH (ref 70–99)
Glucose, Bld: 149 mg/dL — ABNORMAL HIGH (ref 70–99)
Glucose, Bld: 153 mg/dL — ABNORMAL HIGH (ref 70–99)
Potassium: 3.1 mEq/L — ABNORMAL LOW (ref 3.5–5.1)
Potassium: 3.4 mEq/L — ABNORMAL LOW (ref 3.5–5.1)
Potassium: 3.4 mEq/L — ABNORMAL LOW (ref 3.5–5.1)
Potassium: 3.5 mEq/L (ref 3.5–5.1)
Potassium: 4 mEq/L (ref 3.5–5.1)
Sodium: 138 mEq/L (ref 135–145)
Sodium: 138 mEq/L (ref 135–145)
Sodium: 139 mEq/L (ref 135–145)
Sodium: 139 mEq/L (ref 135–145)
Sodium: 139 mEq/L (ref 135–145)
Total Bilirubin: 0.4 mg/dL (ref 0.3–1.2)
Total Bilirubin: 0.5 mg/dL (ref 0.3–1.2)
Total Bilirubin: 0.7 mg/dL (ref 0.3–1.2)
Total Bilirubin: 1.5 mg/dL — ABNORMAL HIGH (ref 0.3–1.2)
Total Bilirubin: 1.9 mg/dL — ABNORMAL HIGH (ref 0.3–1.2)
Total Protein: 6.5 g/dL (ref 6.0–8.3)
Total Protein: 6.7 g/dL (ref 6.0–8.3)
Total Protein: 6.9 g/dL (ref 6.0–8.3)
Total Protein: 7 g/dL (ref 6.0–8.3)
Total Protein: 7.1 g/dL (ref 6.0–8.3)

## 2010-09-27 LAB — GLUCOSE, CAPILLARY
Glucose-Capillary: 101 mg/dL — ABNORMAL HIGH (ref 70–99)
Glucose-Capillary: 102 mg/dL — ABNORMAL HIGH (ref 70–99)
Glucose-Capillary: 103 mg/dL — ABNORMAL HIGH (ref 70–99)
Glucose-Capillary: 106 mg/dL — ABNORMAL HIGH (ref 70–99)
Glucose-Capillary: 107 mg/dL — ABNORMAL HIGH (ref 70–99)
Glucose-Capillary: 109 mg/dL — ABNORMAL HIGH (ref 70–99)
Glucose-Capillary: 113 mg/dL — ABNORMAL HIGH (ref 70–99)
Glucose-Capillary: 113 mg/dL — ABNORMAL HIGH (ref 70–99)
Glucose-Capillary: 113 mg/dL — ABNORMAL HIGH (ref 70–99)
Glucose-Capillary: 115 mg/dL — ABNORMAL HIGH (ref 70–99)
Glucose-Capillary: 117 mg/dL — ABNORMAL HIGH (ref 70–99)
Glucose-Capillary: 118 mg/dL — ABNORMAL HIGH (ref 70–99)
Glucose-Capillary: 125 mg/dL — ABNORMAL HIGH (ref 70–99)
Glucose-Capillary: 126 mg/dL — ABNORMAL HIGH (ref 70–99)
Glucose-Capillary: 133 mg/dL — ABNORMAL HIGH (ref 70–99)
Glucose-Capillary: 137 mg/dL — ABNORMAL HIGH (ref 70–99)
Glucose-Capillary: 138 mg/dL — ABNORMAL HIGH (ref 70–99)
Glucose-Capillary: 141 mg/dL — ABNORMAL HIGH (ref 70–99)
Glucose-Capillary: 142 mg/dL — ABNORMAL HIGH (ref 70–99)
Glucose-Capillary: 143 mg/dL — ABNORMAL HIGH (ref 70–99)
Glucose-Capillary: 97 mg/dL (ref 70–99)

## 2010-09-27 LAB — CBC
HCT: 38 % (ref 36.0–46.0)
HCT: 38.1 % (ref 36.0–46.0)
HCT: 39.1 % (ref 36.0–46.0)
HCT: 39.7 % (ref 36.0–46.0)
Hemoglobin: 12.2 g/dL (ref 12.0–15.0)
Hemoglobin: 12.4 g/dL (ref 12.0–15.0)
Hemoglobin: 12.6 g/dL (ref 12.0–15.0)
Hemoglobin: 12.8 g/dL (ref 12.0–15.0)
MCHC: 32 g/dL (ref 30.0–36.0)
MCHC: 32.3 g/dL (ref 30.0–36.0)
MCHC: 32.3 g/dL (ref 30.0–36.0)
MCHC: 32.6 g/dL (ref 30.0–36.0)
MCV: 76.8 fL — ABNORMAL LOW (ref 78.0–100.0)
MCV: 77.3 fL — ABNORMAL LOW (ref 78.0–100.0)
MCV: 77.3 fL — ABNORMAL LOW (ref 78.0–100.0)
MCV: 77.6 fL — ABNORMAL LOW (ref 78.0–100.0)
Platelets: 203 10*3/uL (ref 150–400)
Platelets: 231 10*3/uL (ref 150–400)
Platelets: 236 10*3/uL (ref 150–400)
Platelets: 254 10*3/uL (ref 150–400)
RBC: 4.89 MIL/uL (ref 3.87–5.11)
RBC: 4.97 MIL/uL (ref 3.87–5.11)
RBC: 5.06 MIL/uL (ref 3.87–5.11)
RBC: 5.14 MIL/uL — ABNORMAL HIGH (ref 3.87–5.11)
RDW: 14.6 % (ref 11.5–15.5)
RDW: 15.1 % (ref 11.5–15.5)
RDW: 15.1 % (ref 11.5–15.5)
RDW: 15.3 % (ref 11.5–15.5)
WBC: 4.5 10*3/uL (ref 4.0–10.5)
WBC: 5.2 10*3/uL (ref 4.0–10.5)
WBC: 7.3 10*3/uL (ref 4.0–10.5)
WBC: 9.8 10*3/uL (ref 4.0–10.5)

## 2010-09-27 LAB — CULTURE, BLOOD (ROUTINE X 2)
Culture: NO GROWTH
Culture: NO GROWTH

## 2010-09-27 LAB — GAMMA GT: GGT: 960 U/L — ABNORMAL HIGH (ref 7–51)

## 2010-09-27 LAB — URINALYSIS, ROUTINE W REFLEX MICROSCOPIC
Bilirubin Urine: NEGATIVE
Glucose, UA: NEGATIVE mg/dL
Hgb urine dipstick: NEGATIVE
Ketones, ur: NEGATIVE mg/dL
Nitrite: NEGATIVE
Protein, ur: NEGATIVE mg/dL
Specific Gravity, Urine: 1.012 (ref 1.005–1.030)
Urobilinogen, UA: 1 mg/dL (ref 0.0–1.0)
pH: 6.5 (ref 5.0–8.0)

## 2010-09-27 LAB — APTT: aPTT: 28 seconds (ref 24–37)

## 2010-09-27 LAB — DIFFERENTIAL
Basophils Absolute: 0 10*3/uL (ref 0.0–0.1)
Basophils Relative: 0 % (ref 0–1)
Eosinophils Absolute: 0 10*3/uL (ref 0.0–0.7)
Eosinophils Relative: 0 % (ref 0–5)
Lymphocytes Relative: 6 % — ABNORMAL LOW (ref 12–46)
Lymphs Abs: 0.4 10*3/uL — ABNORMAL LOW (ref 0.7–4.0)
Monocytes Absolute: 0.3 10*3/uL (ref 0.1–1.0)
Monocytes Relative: 4 % (ref 3–12)
Neutro Abs: 6.7 10*3/uL (ref 1.7–7.7)
Neutrophils Relative %: 91 % — ABNORMAL HIGH (ref 43–77)

## 2010-09-27 LAB — MITOCHONDRIAL ANTIBODIES: Mitochondrial M2 Ab, IgG: NEGATIVE

## 2010-09-27 LAB — LACTIC ACID, PLASMA
Lactic Acid, Venous: 0.8 mmol/L (ref 0.5–2.2)
Lactic Acid, Venous: 2.1 mmol/L (ref 0.5–2.2)

## 2010-09-27 LAB — BRAIN NATRIURETIC PEPTIDE: Pro B Natriuretic peptide (BNP): 56 pg/mL (ref 0.0–100.0)

## 2010-09-27 LAB — PROTIME-INR
INR: 1.15 (ref 0.00–1.49)
Prothrombin Time: 14.6 seconds (ref 11.6–15.2)

## 2010-09-27 LAB — HEPATITIS PANEL, ACUTE
HCV Ab: NEGATIVE
Hep A IgM: NEGATIVE
Hep B C IgM: NEGATIVE
Hepatitis B Surface Ag: NEGATIVE

## 2010-09-27 LAB — HEPATIC FUNCTION PANEL
ALT: 617 U/L — ABNORMAL HIGH (ref 0–35)
AST: 521 U/L — ABNORMAL HIGH (ref 0–37)
Albumin: 3.1 g/dL — ABNORMAL LOW (ref 3.5–5.2)
Alkaline Phosphatase: 278 U/L — ABNORMAL HIGH (ref 39–117)
Bilirubin, Direct: 1.1 mg/dL — ABNORMAL HIGH (ref 0.0–0.3)
Indirect Bilirubin: 1.4 mg/dL — ABNORMAL HIGH (ref 0.3–0.9)
Total Bilirubin: 2.5 mg/dL — ABNORMAL HIGH (ref 0.3–1.2)
Total Protein: 6.6 g/dL (ref 6.0–8.3)

## 2010-09-27 LAB — FERRITIN: Ferritin: 3184 ng/mL — ABNORMAL HIGH (ref 10–291)

## 2010-09-27 LAB — BILIRUBIN, FRACTIONATED(TOT/DIR/INDIR)
Bilirubin, Direct: 1.2 mg/dL — ABNORMAL HIGH (ref 0.0–0.3)
Indirect Bilirubin: 0.8 mg/dL (ref 0.3–0.9)
Total Bilirubin: 2 mg/dL — ABNORMAL HIGH (ref 0.3–1.2)

## 2010-09-27 LAB — CARDIAC PANEL(CRET KIN+CKTOT+MB+TROPI)
CK, MB: 0.7 ng/mL (ref 0.3–4.0)
CK, MB: 0.7 ng/mL (ref 0.3–4.0)
CK, MB: 0.8 ng/mL (ref 0.3–4.0)
Relative Index: INVALID (ref 0.0–2.5)
Relative Index: INVALID (ref 0.0–2.5)
Relative Index: INVALID (ref 0.0–2.5)
Total CK: 44 U/L (ref 7–177)
Total CK: 45 U/L (ref 7–177)
Total CK: 68 U/L (ref 7–177)
Troponin I: 0.04 ng/mL (ref 0.00–0.06)
Troponin I: 0.05 ng/mL (ref 0.00–0.06)
Troponin I: 0.05 ng/mL (ref 0.00–0.06)

## 2010-09-27 LAB — URINE CULTURE
Colony Count: NO GROWTH
Culture: NO GROWTH

## 2010-09-27 LAB — RETICULOCYTES
RBC.: 5.37 MIL/uL — ABNORMAL HIGH (ref 3.87–5.11)
Retic Count, Absolute: 32.2 10*3/uL (ref 19.0–186.0)
Retic Ct Pct: 0.6 % (ref 0.4–3.1)

## 2010-09-27 LAB — IRON AND TIBC
Iron: 57 ug/dL (ref 42–135)
Saturation Ratios: 27 % (ref 20–55)
TIBC: 212 ug/dL — ABNORMAL LOW (ref 250–470)
UIBC: 155 ug/dL

## 2010-09-27 LAB — AMMONIA
Ammonia: 261 umol/L — ABNORMAL HIGH (ref 11–35)
Ammonia: 64 umol/L — ABNORMAL HIGH (ref 11–35)
Ammonia: 72 umol/L — ABNORMAL HIGH (ref 11–35)

## 2010-09-27 LAB — FOLATE: Folate: 6.7 ng/mL

## 2010-09-27 LAB — PHENYTOIN LEVEL, FREE AND TOTAL
Phenytoin Bound: 11.5 ug/mL
Phenytoin, Free: 1.64 ug/mL (ref 1.00–2.00)
Phenytoin, Total: 13.1 ug/mL (ref 10.0–20.0)

## 2010-09-27 LAB — LACTATE DEHYDROGENASE
LDH: 145 U/L (ref 94–250)
LDH: 719 U/L — ABNORMAL HIGH (ref 94–250)
LDH: 830 U/L — ABNORMAL HIGH (ref 94–250)

## 2010-09-27 LAB — RAPID URINE DRUG SCREEN, HOSP PERFORMED
Amphetamines: NOT DETECTED
Barbiturates: NOT DETECTED
Benzodiazepines: NOT DETECTED
Cocaine: NOT DETECTED
Opiates: NOT DETECTED
Tetrahydrocannabinol: NOT DETECTED

## 2010-09-27 LAB — HEMOGLOBIN A1C
Hgb A1c MFr Bld: 6.2 % — ABNORMAL HIGH (ref 4.6–6.1)
Mean Plasma Glucose: 131 mg/dL

## 2010-09-27 LAB — ETHANOL: Alcohol, Ethyl (B): <5 mg/dL (ref 0–10)

## 2010-09-27 LAB — ACETAMINOPHEN LEVEL: Acetaminophen (Tylenol), Serum: <10 ug/mL — ABNORMAL LOW (ref 10–30)

## 2010-09-27 LAB — VITAMIN B12: Vitamin B-12: 1026 pg/mL — ABNORMAL HIGH (ref 211–911)

## 2010-09-27 LAB — LIPASE, BLOOD: Lipase: 74 U/L — ABNORMAL HIGH (ref 11–59)

## 2010-09-27 LAB — PROLACTIN: Prolactin: 6.7 ng/mL

## 2010-09-27 LAB — BILIRUBIN, DIRECT: Bilirubin, Direct: 0.3 mg/dL (ref 0.0–0.3)

## 2010-09-27 LAB — ANA: Anti Nuclear Antibody(ANA): NEGATIVE

## 2010-09-27 LAB — ANTI-SMITH ANTIBODY: ENA SM Ab Ser-aCnc: <0.2 AI (ref <1.0)

## 2010-09-27 LAB — AMYLASE: Amylase: 62 U/L (ref 27–131)

## 2010-09-28 LAB — URINALYSIS, ROUTINE W REFLEX MICROSCOPIC
Bilirubin Urine: NEGATIVE
Glucose, UA: NEGATIVE mg/dL
Hgb urine dipstick: NEGATIVE
Ketones, ur: NEGATIVE mg/dL
Nitrite: NEGATIVE
Protein, ur: NEGATIVE mg/dL
Specific Gravity, Urine: 1.013 (ref 1.005–1.030)
Urobilinogen, UA: 0.2 mg/dL (ref 0.0–1.0)
pH: 6 (ref 5.0–8.0)

## 2010-09-28 LAB — PHENYTOIN LEVEL, TOTAL: Phenytoin Lvl: 11.5 ug/mL (ref 10.0–20.0)

## 2010-09-28 NOTE — Progress Notes (Signed)
[name], [name]               ACCOUNT NO.:  [id number]  MEDICAL RECORD NO.:  [id number]           PATIENT TYPE:  O  LOCATION:  [facility]                         FACILITY:  [facility]  PHYSICIAN:  Kela Millin, M.D.DATE OF BIRTH:  [birth year]                                PROGRESS NOTE   This progress note spans the period from August 30, 2010 through September 05, 2010, and please see the previous progress notes dictated on August 22, 2010 and August 29, 2010 for the rest of the details of the patient's hospital course.  1. Abnormal troponins with chest pain - as noted.  The patient had a     non-ST elevation myocardial infarction this hospital stay and had a     negative Myoview stress test on August 25, 2010 but there has had     just been this period and a recycled of cardiac enzymes were     elevated and patient was scheduled for cardiac cath at [facility] today     per cardiology. 2. Hypoglycemia - resolved, Lantus discontinued, and patient is on     sliding scale insulin. 3. Gallstones, pancreatitis - status post laparoscopic cholecystectomy     on August 30, 2010 and patient's diet now advanced to solids after     surgery. 4. Diabetes mellitus. 5. Hypertension. 6. Status post hypercarbic respiratory failure - stable. 7. History of obstructive sleep apnea. 8. History of chronic back pain. 9. History of seizure disorder. 10.Hyperlipidemia. 11.Diastolic heart failure - compensated.  PROCEDURES AND STUDIES: 1. CT angiogram of chest on September 04, 2010 - bilateral trace pleural     effusions with associated compressive atelectasis.  No focal     consolidation at the left lung base - ?superimposed pneumonia.     Probable rounded atelectasis of the superior segment of the right     lower lobe.  No central acute pulmonary embolism identified. 2. Cardiac catheterization at [facility] today, September 05, 2010 -     minimal luminal irregularities on coronary angiography, elevated     troponins  likely secondary to demand ischemia per cardiology.     Official cath report pending at the time of this dictation.  CONSULTATION: 1. [facility] continued to follow the patient during this. 2. Cardiology was reconsulted, above I saw the patient and chose cath     today as above.  ADDENDUM TO THE HOSPITAL COURSE: 1. Abnormal chest pain with abnormal troponins - as discussed above.     The patient has had a non-ST elevation myocardial infarction     earlier this hospital stay and cardiology was followed and did a     stress Myoview prior to her laparoscopic cholecystectomy on August 30, 2010.  The Myoview came back negative for ischemia.  The     patient during this period began having chest pain that was     recurrent and cardiac enzymes were sent for and came back elevated.     Cardiology was reconsulted as well as she was scheduled for cardiac     cath today, indicated that cath reviewed  minimal luminal     irregularities and the impression is elevated troponins, likely     secondary to demand ischemia.  The official cath report is pending    at this time of this dictation. 2. Probable pneumonia, left lower lobe - the patient had a CT     angiogram of the chest on September 04, 2010 and it came back negative     for PE but it did reveal possible left lower lobe pneumonia and so,     the patient has been started on empiric antibiotics with Avelox.     She will have a followup chest x-ray on September 06, 2010. 3. Gallstone pancreatitis, hepatitis - as already discussed above.     The patient had laparoscopic cholecystectomy on August 30, 2010 and     she tolerated the procedure well.  General surgery followed and     advanced her diet, and she is currently on solids. 4. Hypoglycemia - the patient developed hypoglycemic episodes while on     the Lantus 40 during this period.  Upon review, it was noted that     prior to admission, she was only receiving metformin 500 once     daily.  The  Lantus was discontinued because of the hypoglycemic     episodes and she is currently on sliding scale insulin and she has     not had any further hypoglycemic episodes and her last blood sugars     have ranged from 112 to 143.  Rounding hospitalist to further     monitor blood sugars and consider resuming Lantus at the lower dose     versus metformin when clinically appropriate.  Her hemoglobin A1c     done today was 7.7.  Her other medical problems as listed in the previous progress notes of August 22, 2010 and has remained stable except as indicated above. Rounding hospitalist to dictate the rest of the patient's hospital stay as well as her final discharge medications.     Kela Millin, M.D.     ACV/MEDQ  D:  09/05/2010  T:  09/05/2010  Job:  [id number]  Electronically Signed by Donnalee Curry M.D. on 09/28/2010 08:33:15 AM

## 2010-09-28 NOTE — Progress Notes (Signed)
[name], JAKES               ACCOUNT NO.:  [id number]  MEDICAL RECORD NO.:  [id number]           PATIENT TYPE:  I  LOCATION:  1241                         FACILITY:  [facility]  PHYSICIAN:  Kela Millin, M.D.DATE OF BIRTH:  01/07/1946                                PROGRESS NOTE   CURRENT DIAGNOSES: 1. Hypercarbic respiratory failure - improved, transferred to floor     today on BiPAP q.h.s. 2. Non-ST elevation myocardial infarction - medical management per     Cardiology, to be reconsulted for a stress test this week if     continuing to improve. 3. Gallstone pancreatitis - laparoscopic cholecystectomy planned per     surgery pending stress test per Cardiology to assess cardiac risk. 4. Leukocytosis - resolving, likely secondary to steroids, now     deceased, and pancreatitis. 5. Diabetes mellitus. 6. Hypertension. 7. History of seizure disorder. 8. Hyperlipidemia. 9. History of chronic back pain. 10.History of obstructive sleep apnea. 11.Empty sella on MRI - July 09, 2007. 12.History of diverticulitis in the past. 13.History of abnormal liver function tests in the past. 14.History of intermittent headaches/migraines. 15.Pulmonary edema/diastolic heart failure.  PROCEDURES AND STUDIES: 1. Chest x-ray on February 19 - no obstruction of free air.  Relative     paucity of gas in the abdomen. 2. CT scan of abdomen and pelvis on February 19 - changes of acute     pancreatitis.  Bibasilar atelectasis and infiltrates.  Mild     cardiomegaly.  Left colonic diverticulosis. 3. Abdominal ultrasound on February 19 - heterogeneously hypoechoic     liver with prominence of the portal triads.  This may be seen with     underlying liver disease such as hepatitis but is otherwise     nonspecific.  Small amount of ascites noted.  Simple appearing     right upper renal pole cyst. 4. Chest x-ray on February 20 - low volumes, congestive heart failure,     bilateral effusions. 5.  Followup chest x-ray on February 21 - no significant change. 6. Followup chest x-ray on February 21 - stable cardiomegaly, vascular     congestion, and left lower lung consolidation - atelectasis.     Slightly improved aeration at the right lung base.  Chest x-ray on     February 22 - stable cardiomegaly and low lung volumes.  Left     retrocardiac atelectasis versus infiltrate. 7. Chest x-ray on February 22 - right PICC line called over the right     axilla with the tip in the region of the right axillary     vein/subclavian vein.  No pneumothorax. 8. Chest x-ray on February 23 - marked cardiomegaly with pulmonary     edema and left greater than right lung base atelectasis.     Viral/atypical pneumonia as an explanation for this, appearance is     felt less likely. 9. Chest x-ray on February 24 - stable cardiomegaly and edema.     Slightly improved aeration at the right lung base with some     persistent airspace disease.  No significant change, left basilar  air space disease. 10.Chest x-ray on February 25 - worsening air space opacity at the     medial right lung base, could reflect increased atelectasis or     pneumonia.  Left lower lobe collapse otherwise stable.  Pulmonary     vascular congestion and cardiomegaly. 11.A 2-D echo on August 14, 2010 - ejection fraction 60% to 65%,     wall motion normal, and no regional wall motion abnormalities.  CONSULTATIONS: 1. Gastroenterology - [place], Dr. Christella Hartigan. 2. Critical care, [place]. 3. [facility]/Dr. Donell Beers. 4. [facility].  BRIEF HISTORY: The patient is a 65 year old black female with above-listed medical problems, who presented with complaints of nausea and epigastric pain radiating to the back.  She also reported vomiting as well as diarrhea. She denied any exposure to acute viral illnesses and denied alcohol as well.  She denied any prior episodes of pancreatitis.  She went to the ED and lab work revealed a  lipase of 1995 and her LFTs were abnormal as well.  She had a CT scan of her abdomen and pelvis and the results as stated above and she was admitted for further evaluation and management.  HOSPITAL COURSE: 1. Acute gallstone pancreatitis - upon admission, the patient was kept     n.p.o., hydrated with IV fluids, and started on IV narcotics for     pain management.  A CT scan of her abdomen and pelvis was done and     the results as stated above.  An abdominal ultrasound was also done     and revealed multiple mobile gallstones, largest measuring 1.5 cm.     No gallbladder wall thickening, pericystic fluid or sonographic     Murphy's sign noted.  A heterogeneously hypoechoic liver with     prominence of the portal triads was also noted with small amount of     ascites.  Gastroenterology was consulted and Dr. Gerilyn Pilgrim saw the     patient and agreed with the conservative management/supportive     care.  Her leukocytosis was also noted and GI indicated that there     was no need for antibiotics from a GI standpoint for this.  They     recommended surgery consult given gallstones as the etiology for     her pancreatitis.  By the time [facility] saw the patient,     she had developed a non-ST elevation myocardial infarction and     subsequently developed hypercarbic respiratory failure as well and     because of this, the holding of the surgery pending stress test per     Cardiology if the patient continues to improve for assessment of     cardiac risks.  The patient's pancreatitis/abdominal pain was slow     to improve and GI recommended TNA and a PICC line was placed on     August 16, 2010.  Following that, the patient was started on TNA.     Her abdominal pain and bowel function has been improving and so the     patient is tolerating p.o.'s better at this time and her diet has     been advanced to a low-fat solids and so the TNA has been     discontinued today. 2. Non-ST elevation  myocardial infarction - following her admission,     the patient had cardiac enzymes done and they came back positive.     She was on aspirin and a beta blocker was started as well as  the     heparin drip and Cardiology consulted.  The impression was that     this was likely secondary to demand ischemia secondary to her     hypoxia/acute illness and hypertensive urgency.  A 2-D     echocardiogram was obtained and the results as stated above.     Subsequently after the patient was started on heparin drip, she was     noted to have some rectal bleeding - bright red blood per rectum.     Gastroenterology was consulted and they saw this patient and her     hemoglobins were remaining stable and on exam, no seizures were     noted and the impression was that the bleeding was secondary to an     internal hemorrhoid and had been precipitated by the heparin drip.     They recommended Anusol suppositories and the patient was monitored     closely and she has not had any further bleeding.  The patient     remained chest pain free and Cardiology followed and added nitrates     and ACE inhibitors as tolerated by her blood pressure.  Cardiology     recommended doing a stress test to assess her cardiac risk in     anticipation of her gallbladder surgery, but the patient developed     hypercarbic respiratory failure.  As a result, they signed off and     requested to be called back this week if the patient was continuing     to improve for the stress test to be done. 3. Hypercarbic respiratory failure - on February 20, the patient     developed respiratory distress with shallow and labored     respirations and O2 sats on 4 L nasal cannula 90%.  The patient was     thus transferred to the step-down unit.  Her CT scan done on     admission had showed bibasilar atelectasis or infiltrate and given     that, she had the leukocytosis on admission.  She was started on     empiric antibiotics for a  community-acquired pneumonia.  With her     worsening respiratory distress on February 20, she had a followup     chest x-ray which showed findings consistent with CHF and bilateral     effusions.  IV fluids were decreased.  She was started on BiPAP     p.r.n. and subsequently started on IV Lasix for diuresis.  CCM was     consulted on February 22 as the patient's respiratory status was     declining and they saw the patient and she was started on NIPPV and     the impression was that her hypercarbic respiratory failure was     multifactorial secondary to her morbid obesity, decreased abdominal     compliance in the setting of pancreatitis, as well as the     atelectasis and an element of pulmonary edema.  With the     BiPAP/NIPPV and diuresis with IV Lasix, she gradually improved.     Her ABG on 2 L nasal cannula this morning showed a pH of 7.38 with     a pCO2 of 52.3, a pO2 of 88.1, O2 sat of 97%.  CCM recommended to     keep the patient on BiPAP q.h.s. and she is much clinically     improved at this time and has remained hemodynamically stable and  will be transferred to a tele bed. 4. Acute diastolic heart failure - as discussed above, a 2-D     echocardiogram was done and the results as stated above.  The     patient was diuresed with IV Lasix as above and at this time she is     on Bystolic as well as lisinopril.  She is clinically improved as     discussed above and her brain natriuretic peptide 277 on February     28. 5. Type 2 diabetes mellitus - the patient was placed on Lantus during     this hospital stay and her metformin is on hold at this time.  Her     Accu-Cheks have been monitored and she has been covered with     sliding scale insulin as well. 6. The rounding hospitalist to dictate the rest of her hospital stay     as well as final discharge medications.  Also, Cardiology to be     reconsulted this week for the stress Myoview if she continues to     improve and  following that, [facility] to be called back to     see the patient for laparoscopic cholecystectomy pending the stress     test.     Kela Millin, M.D.     ACV/MEDQ  D:  08/22/2010  T:  08/22/2010  Job:  [id number]  Electronically Signed by Donnalee Curry M.D. on 09/28/2010 08:32:21 AM

## 2010-09-29 LAB — DIFFERENTIAL
Basophils Absolute: 0 10*3/uL (ref 0.0–0.1)
Basophils Relative: 1 % (ref 0–1)
Eosinophils Absolute: 0.1 K/uL (ref 0.0–0.7)
Eosinophils Relative: 1 % (ref 0–5)
Lymphocytes Relative: 21 % (ref 12–46)
Lymphs Abs: 1.3 10*3/uL (ref 0.7–4.0)
Monocytes Absolute: 0.4 10*3/uL (ref 0.1–1.0)
Monocytes Relative: 7 % (ref 3–12)
Neutro Abs: 4.6 10*3/uL (ref 1.7–7.7)
Neutrophils Relative %: 71 % (ref 43–77)

## 2010-09-29 LAB — BASIC METABOLIC PANEL
BUN: 14 mg/dL (ref 6–23)
CO2: 27 mEq/L (ref 19–32)
Calcium: 8.9 mg/dL (ref 8.4–10.5)
Chloride: 106 mEq/L (ref 96–112)
Creatinine, Ser: 0.65 mg/dL (ref 0.4–1.2)
GFR calc Af Amer: >60 mL/min (ref >60)
GFR calc non Af Amer: >60 mL/min (ref >60)
Glucose, Bld: 87 mg/dL (ref 70–99)
Potassium: 3.7 mEq/L (ref 3.5–5.1)
Sodium: 141 mEq/L (ref 135–145)

## 2010-09-29 LAB — URINALYSIS, ROUTINE W REFLEX MICROSCOPIC
Glucose, UA: NEGATIVE mg/dL
Ketones, ur: NEGATIVE mg/dL
Leukocytes, UA: NEGATIVE
Nitrite: NEGATIVE
Protein, ur: NEGATIVE mg/dL
Specific Gravity, Urine: 1.011 (ref 1.005–1.030)
Urobilinogen, UA: 0.2 mg/dL (ref 0.0–1.0)
pH: 6 (ref 5.0–8.0)

## 2010-09-29 LAB — URINE MICROSCOPIC-ADD ON

## 2010-09-29 LAB — CBC
HCT: 41.2 % (ref 36.0–46.0)
Hemoglobin: 13.2 g/dL (ref 12.0–15.0)
MCHC: 32.1 g/dL (ref 30.0–36.0)
MCV: 76.7 fL — ABNORMAL LOW (ref 78.0–100.0)
Platelets: 236 10*3/uL (ref 150–400)
RBC: 5.37 MIL/uL — ABNORMAL HIGH (ref 3.87–5.11)
RDW: 14.8 % (ref 11.5–15.5)
WBC: 6.4 10*3/uL (ref 4.0–10.5)

## 2010-09-29 LAB — PHENYTOIN LEVEL, TOTAL: Phenytoin Lvl: 11.9 ug/mL (ref 10.0–20.0)

## 2010-10-01 LAB — COMPREHENSIVE METABOLIC PANEL
ALT: 96 U/L — ABNORMAL HIGH (ref 0–35)
AST: 198 U/L — ABNORMAL HIGH (ref 0–37)
Albumin: 3.4 g/dL — ABNORMAL LOW (ref 3.5–5.2)
Alkaline Phosphatase: 164 U/L — ABNORMAL HIGH (ref 39–117)
BUN: 14 mg/dL (ref 6–23)
CO2: 27 mEq/L (ref 19–32)
Calcium: 9 mg/dL (ref 8.4–10.5)
Chloride: 109 mEq/L (ref 96–112)
Creatinine, Ser: 0.81 mg/dL (ref 0.4–1.2)
GFR calc Af Amer: >60 mL/min (ref >60)
GFR calc non Af Amer: >60 mL/min (ref >60)
Glucose, Bld: 131 mg/dL — ABNORMAL HIGH (ref 70–99)
Potassium: 3.7 mEq/L (ref 3.5–5.1)
Sodium: 142 mEq/L (ref 135–145)
Total Bilirubin: 0.5 mg/dL (ref 0.3–1.2)
Total Protein: 6.6 g/dL (ref 6.0–8.3)

## 2010-10-01 LAB — URINALYSIS, ROUTINE W REFLEX MICROSCOPIC
Bilirubin Urine: NEGATIVE
Glucose, UA: NEGATIVE mg/dL
Ketones, ur: NEGATIVE mg/dL
Leukocytes, UA: NEGATIVE
Nitrite: NEGATIVE
Protein, ur: 30 mg/dL — AB
Specific Gravity, Urine: 1.027 (ref 1.005–1.030)
Urobilinogen, UA: 1 mg/dL (ref 0.0–1.0)
pH: 6 (ref 5.0–8.0)

## 2010-10-01 LAB — DIFFERENTIAL
Basophils Absolute: 0 10*3/uL (ref 0.0–0.1)
Basophils Relative: 0 % (ref 0–1)
Eosinophils Absolute: 0.1 10*3/uL (ref 0.0–0.7)
Eosinophils Relative: 1 % (ref 0–5)
Lymphocytes Relative: 15 % (ref 12–46)
Lymphs Abs: 1.1 10*3/uL (ref 0.7–4.0)
Monocytes Absolute: 0.4 10*3/uL (ref 0.1–1.0)
Monocytes Relative: 6 % (ref 3–12)
Neutro Abs: 5.6 10*3/uL (ref 1.7–7.7)
Neutrophils Relative %: 78 % — ABNORMAL HIGH (ref 43–77)

## 2010-10-01 LAB — CBC
HCT: 39.9 % (ref 36.0–46.0)
Hemoglobin: 12.8 g/dL (ref 12.0–15.0)
MCHC: 32.2 g/dL (ref 30.0–36.0)
MCV: 76.9 fL — ABNORMAL LOW (ref 78.0–100.0)
Platelets: 238 10*3/uL (ref 150–400)
RBC: 5.19 MIL/uL — ABNORMAL HIGH (ref 3.87–5.11)
RDW: 15.1 % (ref 11.5–15.5)
WBC: 7.2 10*3/uL (ref 4.0–10.5)

## 2010-10-01 LAB — PHENYTOIN LEVEL, TOTAL: Phenytoin Lvl: 10.7 ug/mL (ref 10.0–20.0)

## 2010-10-01 LAB — URINE MICROSCOPIC-ADD ON

## 2010-10-05 ENCOUNTER — Other Ambulatory Visit: Payer: Self-pay | Admitting: Internal Medicine

## 2010-10-05 NOTE — H&P (Signed)
NAME:  [name], [name] NO.:  [id number]  MEDICAL RECORD NO.:  [id number]           PATIENT TYPE:  E  LOCATION:  [facility]LED                         FACILITY:  [facility]  PHYSICIAN:  Massie Maroon, MD        DATE OF BIRTH:  12/01/1945  DATE OF ADMISSION:  08/13/2010 DATE OF DISCHARGE:                             HISTORY & PHYSICAL   CHIEF COMPLAINT:  "I felt sick to my stomach last night."  HISTORY OF PRESENT ILLNESS:  This is a 65 year old female with a history of diabetes type 2, hypertension, hyperlipidemia, seizure disorder, chronic back pain, and probable sleep apnea.  Apparently presents with complaints of feeling sick to her stomach last night as well as having sharp epigastric pain radiating to the back.  This was associated with nausea, vomiting, and diarrhea.  She cannot recall eating any new foods or odd foods and she does not recall any sick contact, exposures to acute viral illness, or alcoholic beverages.  She denies any Tylenol use.  The patient has not had prior episodes of pancreatitis.  The patient was brought to the ED for evaluation.  Her lipase was 1995.  Her liver function was abnormal as well.  A CT scan of the abdomen and pelvis was performed and there was no evidence of any cholelithiasis. There were findings of pancreatitis without any evidence of necrosis, hemorrhage, or tumor.  The patient will be admitted for workup of pancreatitis.  PAST MEDICAL HISTORY: 1. Type 2 diabetes. 2. Hypertension. 3. Hyperlipidemia. 4. Seizure disorder. 5. Morbid obesity. 6. Chronic back pain. 7. Diverticulitis in the past. 8. Probable sleep apnea. 9. History of abnormal liver function tests in the past. 10.Empty sella on MRI brain on July 09, 2007.  PAST SURGICAL HISTORY: 1. Appendectomy. 2. Hysterectomy. 3. Tubal ligation. 4. Cyst removal from the skin. 5. Ultrasound-guided left breast biopsy on September 15, 1999.  SOCIAL HISTORY:  The patient lives  with her relatives.  She does not smoke.  She does not drink.  She is unemployed.  FAMILY HISTORY:  Mother died at age 52 of renal failure.  Her father died of heart attack at the age of 69 and was a smoker and drinker.  ALLERGIES: 1. CODEINE. 2. PENICILLIN. 3. SULFA.  MEDICATIONS: 1. Lamotrigine 100 mg p.o. b.i.d. 2. Gabapentin 300 mg p.o. t.i.d. 3. Metoprolol 25 mg p.o. b.i.d. 4. Metformin 500 mg p.o. q.h.s. 5. Zoloft 50 mg three p.o. q.a.m. 6. Simvastatin 40 mg p.o. q.h.s. 7. Abilify 5 mg p.o. q.a.m. 8. Lisinopril 20 mg p.o. q.a.m. 9. Enteric-coated aspirin 81 mg p.o. q.a.m. 10.Oxycodone 5 mg p.r.n. 11.Ibuprofen 200 mg p.r.n. 12.Etodolac 500 mg one to two p.r.n.  REVIEW OF SYSTEMS:  Negative for all 10 organ systems except for pertinent positives stated above.  PHYSICAL EXAMINATION:  VITAL SIGNS:  Temperature 98.8, pulse 74, blood pressure 172/82, and pulse oximetry is 79% on room air. HEENT:  Anicteric, EOMI, no nystagmus, pupils 1.5 mm, symmetric direct consensual near reflexes intact.  Mucous membranes moist. NECK:  No JVD, no bruit, no thyromegaly, no adenopathy. HEART:  Regular rate and  rhythm.  S1 and S2.  No murmurs, gallops, or rubs. LUNGS:  Clear to auscultation bilaterally.  Slight crackles at the left lung base, no wheezes. ABDOMEN:  Soft, slightly tender, no guarding, no rebound.  Positive normoactive bowel sounds, negative Cullen sign. EXTREMITIES:  No cyanosis, clubbing, or edema. SKIN:  No rashes. LYMPH NODES:  No adenopathy. NEUROLOGIC EXAM:  Nonfocal.  LABORATORY DATA:  Lipase 1995.  Urinalysis negative.  WBC 13.9, hemoglobin 15.2, and platelet count is 252.  Sodium 139, potassium 4.4, BUN 14, creatinine 1.10, AST 534, ALT 308, alkaline phosphatase 204, and total bilirubin 1.4.  ASSESSMENT: 1. Pancreatitis. 2. Abnormal liver function test. 3. Nausea, vomiting, and diarrhea. 4. History of abnormal liver function tests. 5. Diabetes type 2. 6.  Hypertension. 7. Hyperlipidemia. 8. Seizure disorder. 9. Chronic back pain.  PLAN:  The patient placed on telemetry.  The patient will be hydrated aggressively with normal saline IV and be made n.p.o.  We will use pain control with Dilaudid.  It is difficult to say what is the cause of her pancreatitis.  It is also difficult to say what the cause of her abnormal liver function tests are.  Obviously she probably has a baseline dysfunction.  We will stop her simvastatin 40 mg p.o. q.h.s. We will check an acute hepatitis panel.  Check iron, TIBC, ferritin, ceruloplasmin alpha one and antitrypsin ANA and antismooth muscle antibody, AMA, and antimitochondrial antibody, and celiac panel.  The patient will be given GI prophylaxis with Protonix 40 mg IV b.i.d.  We will continue her on her Lamictal as well as gabapentin for now as well as her Zoloft for depression as well as her Abilify for depression.  We will withhold her metformin and use fingerstick blood sugars q.4 h. and NovoLog sensitive sliding scale.  We will treat for possible pneumonia with Avelox 400 mg IV daily.  Also because of her abnormal liver function tests, we will check a Tylenol level as well as a salicylate level and right upper quadrant ultrasound.  Consider workup of empty sella.  We will check a cortisol level as well as a TSH.  For DVT prophylaxis, SCDs.     Massie Maroon, MD     JYK/MEDQ  D:  08/13/2010  T:  08/13/2010  Job:  [id number]  cc:   Maryelizabeth Rowan, M.D. Fax: [phone]  Electronically Signed by Pearson Grippe MD on 10/05/2010 01:38:54 AM

## 2010-10-09 LAB — DIFFERENTIAL
Basophils Absolute: 0 10*3/uL (ref 0.0–0.1)
Basophils Relative: 1 % (ref 0–1)
Eosinophils Absolute: 0.1 10*3/uL (ref 0.0–0.7)
Eosinophils Relative: 2 % (ref 0–5)
Lymphocytes Relative: 40 % (ref 12–46)
Lymphs Abs: 1.8 10*3/uL (ref 0.7–4.0)
Monocytes Absolute: 0.3 10*3/uL (ref 0.1–1.0)
Monocytes Relative: 6 % (ref 3–12)
Neutro Abs: 2.3 10*3/uL (ref 1.7–7.7)
Neutrophils Relative %: 50 % (ref 43–77)

## 2010-10-09 LAB — URINALYSIS, ROUTINE W REFLEX MICROSCOPIC
Bilirubin Urine: NEGATIVE
Glucose, UA: NEGATIVE mg/dL
Hgb urine dipstick: NEGATIVE
Ketones, ur: NEGATIVE mg/dL
Nitrite: NEGATIVE
Protein, ur: NEGATIVE mg/dL
Specific Gravity, Urine: 1.007 (ref 1.005–1.030)
Urobilinogen, UA: 0.2 mg/dL (ref 0.0–1.0)
pH: 6.5 (ref 5.0–8.0)

## 2010-10-09 LAB — BASIC METABOLIC PANEL
BUN: 9 mg/dL (ref 6–23)
CO2: 28 mEq/L (ref 19–32)
Calcium: 8.8 mg/dL (ref 8.4–10.5)
Chloride: 106 mEq/L (ref 96–112)
Creatinine, Ser: 0.64 mg/dL (ref 0.4–1.2)
GFR calc Af Amer: >60 mL/min (ref >60)
GFR calc non Af Amer: >60 mL/min (ref >60)
Glucose, Bld: 100 mg/dL — ABNORMAL HIGH (ref 70–99)
Potassium: 3.5 mEq/L (ref 3.5–5.1)
Sodium: 140 mEq/L (ref 135–145)

## 2010-10-09 LAB — CBC
HCT: 41.4 % (ref 36.0–46.0)
Hemoglobin: 12.9 g/dL (ref 12.0–15.0)
MCHC: 31.2 g/dL (ref 30.0–36.0)
MCV: 77.5 fL — ABNORMAL LOW (ref 78.0–100.0)
Platelets: 242 10*3/uL (ref 150–400)
RBC: 5.34 MIL/uL — ABNORMAL HIGH (ref 3.87–5.11)
RDW: 14.4 % (ref 11.5–15.5)
WBC: 4.5 10*3/uL (ref 4.0–10.5)

## 2010-10-09 LAB — PHENYTOIN LEVEL, TOTAL: Phenytoin Lvl: 8.4 ug/mL — ABNORMAL LOW (ref 10.0–20.0)

## 2010-10-09 LAB — CARBAMAZEPINE LEVEL, TOTAL: Carbamazepine Lvl: <2 ug/mL — ABNORMAL LOW (ref 4.0–12.0)

## 2010-10-11 ENCOUNTER — Other Ambulatory Visit: Payer: Self-pay | Admitting: General Surgery

## 2010-10-13 ENCOUNTER — Ambulatory Visit
Admission: RE | Admit: 2010-10-13 | Discharge: 2010-10-13 | Disposition: A | Discharge status: Home / Self Care | Payer: Medicare HMO | Source: Ambulatory Visit | Attending: General Surgery | Admitting: General Surgery

## 2010-10-21 ENCOUNTER — Encounter: Payer: Self-pay | Admitting: Cardiovascular Disease

## 2010-10-24 ENCOUNTER — Encounter: Payer: Self-pay | Admitting: Cardiovascular Disease

## 2010-10-24 ENCOUNTER — Ambulatory Visit (INDEPENDENT_AMBULATORY_CARE_PROVIDER_SITE_OTHER): Payer: Medicare HMO | Admitting: Cardiovascular Disease

## 2010-10-24 VITALS — BP 140/80 | HR 79 | Resp 16 | Ht 61.0 in | Wt 246.0 lb

## 2010-10-24 DIAGNOSIS — I509 Heart failure, unspecified: Secondary | ICD-10-CM

## 2010-10-24 DIAGNOSIS — I5032 Chronic diastolic (congestive) heart failure: Secondary | ICD-10-CM | POA: Insufficient documentation

## 2010-10-24 NOTE — Progress Notes (Signed)
History of Present Illness:64 yo female with history of DM, HTN, Hyperlipidemia, OSA with recent admission to [facility] with nausea, vomiting. She ruled in for an MI. Cardiac cath with no evidence of CAD. LV function was normal. She had a laparoscopic cholecystectomy. She has done well since then. She was presumed to have diastolic heart failure. No SOB but still on home O2 per discharge plans. No chest pain, dizziness, near syncope or syncope.   Past Medical History  Diagnosis Date  . Myocardial infarction   . Respiratory failure      multifactorial secondary to congestive heart failure, morbid obesity, decreased abdominal  compliance in the setting of pancreatitis.  . Diastolic heart failure   . Leukocytosis     secondary to steroid and pancreatitis.  . DM (diabetes mellitus)   . HTN (hypertension)   . Seizure disorder   . Hyperlipidemia   . Back pain   . Obstructive sleep apnea   . Empty sella     on MRI in 2009.  . Diverticulitis   . Abnormal liver function      in the past.  . Headache   . Pneumonia     Past Surgical History  Procedure Date  . Cardiac catheterization     Current Outpatient Prescriptions  Medication Sig Dispense Refill  . ARIPiprazole (ABILIFY) 5 MG tablet Take 5 mg by mouth daily.        Marland Kitchen aspirin 81 MG tablet Take 81 mg by mouth daily.        . ferrous sulfate 325 (65 FE) MG tablet Take 325 mg by mouth daily with breakfast.        . furosemide (LASIX) 40 MG tablet Take 40 mg by mouth 2 (two) times daily.        . isosorbide mononitrate (IMDUR) 30 MG 24 hr tablet Take 30 mg by mouth daily.        . nebivolol (BYSTOLIC) 10 MG tablet Take 10 mg by mouth daily.        . sertraline (ZOLOFT) 50 MG tablet Take 50 mg by mouth daily.        Marland Kitchen DISCONTD: moxifloxacin (AVELOX) 400 MG tablet Take 400 mg by mouth daily.        Marland Kitchen DISCONTD: oxycodone (OXY-IR) 5 MG capsule Take 5 mg by mouth every 4 (four) hours as needed.        Marland Kitchen DISCONTD: pantoprazole  (PROTONIX) 20 MG tablet Take 20 mg by mouth daily.          Allergies not on file  History   Social History  . Marital Status: Divorced    Spouse Name: N/A    Number of Children: N/A  . Years of Education: N/A   Occupational History  . Not on file.   Social History Main Topics  . Smoking status: Not on file  . Smokeless tobacco: Not on file  . Alcohol Use: Not on file  . Drug Use: Not on file  . Sexually Active: Not on file   Other Topics Concern  . Not on file   Social History Narrative  . No narrative on file    No family history on file.  Review of Systems:  As stated in the HPI and otherwise negative.   BP 140/80  Pulse 79  Resp 16  Ht 5\' 1"  (1.549 m)  Wt 246 lb (111.585 kg)  BMI 46.48 kg/m2  Physical Examination: General: Well developed, well nourished, NAD HEENT:  OP clear, mucus membranes moist SKIN: warm, dry. No rashes. Neuro: No focal deficits Musculoskeletal: Muscle strength 5/5 all ext Psychiatric: Mood and affect normal Neck: No JVD, no carotid bruits, no thyromegaly, no lymphadenopathy. Lungs:Clear bilaterally, no wheezes, rhonci, crackles Cardiovascular: Regular rate and rhythm. No murmurs, gallops or rubs. Abdomen:Soft. Bowel sounds present. Non-tender.  Extremities: No lower extremity edema. Pulses are 2 + in the bilateral DP/PT.  EKG:NSR, rate 79 bpm.  Cardiac Cath 09/06/10:  1. Hemodynamics:  Aorta 126/76.   2. Left ventriculography was not done.  EF was 60% by recent echo.   3. Right coronary artery:  The right coronary artery was a small       nondominant vessel with luminal irregularities.   4. Left main:  Left main had no angiographic disease.   5. Left circumflex system:  The left circumflex system was relatively       small with mild luminal irregularities.   6. LAD system:  The LAD was a large vessel wrapping around the apex       and supplying a large portion of the PDA territory.  There were       minimal luminal  irregularities.  Echo 08/14/10:  Left ventricle: The cavity size was normal. Systolic function was     normal. The estimated ejection fraction was in the range of 60% to     65%. Wall motion was normal; there were no regional wall motion     abnormalities. Doppler parameters are consistent with abnormal     left ventricular relaxation (grade 1 diastolic dysfunction).   - Ventricular septum: Septal motion showed paradox.   - Left atrium: The atrium was mildly to moderately dilated.   - Right ventricle: The cavity size was moderately dilated. Systolic     function was mildly to moderately reduced.

## 2010-10-24 NOTE — Patient Instructions (Signed)
Your physician recommends that you schedule a follow-up appointment in: 1 year with Dr. Clifton James  Your physician has requested that you have an echocardiogram. Echocardiography is a painless test that uses sound waves to create images of your heart. It provides your doctor with information about the size and shape of your heart and how well your heart's chambers and valves are working. This procedure takes approximately one hour. There are no restrictions for this procedure. To be done in 1 year.

## 2010-10-24 NOTE — Assessment & Plan Note (Signed)
No evidence of CAD by cath or significant valvular disease by echo February 2012. Will continue daily Lasix and afterload reduction with Imdur. Repeat echo one year.

## 2010-10-25 ENCOUNTER — Institutional Professional Consult (permissible substitution): Payer: Medicare HMO | Admitting: Pulmonary Disease

## 2010-10-26 ENCOUNTER — Ambulatory Visit (INDEPENDENT_AMBULATORY_CARE_PROVIDER_SITE_OTHER): Payer: Medicare HMO | Admitting: Internal Medicine

## 2010-10-26 ENCOUNTER — Encounter: Payer: Self-pay | Admitting: Internal Medicine

## 2010-10-26 VITALS — BP 110/68 | HR 65 | Ht 61.0 in | Wt 243.0 lb

## 2010-10-26 DIAGNOSIS — E662 Morbid (severe) obesity with alveolar hypoventilation: Secondary | ICD-10-CM | POA: Insufficient documentation

## 2010-10-26 DIAGNOSIS — J9602 Acute respiratory failure with hypercapnia: Secondary | ICD-10-CM

## 2010-10-26 DIAGNOSIS — R0602 Shortness of breath: Secondary | ICD-10-CM

## 2010-10-26 DIAGNOSIS — J9620 Acute and chronic respiratory failure, unspecified whether with hypoxia or hypercapnia: Secondary | ICD-10-CM

## 2010-10-26 NOTE — Progress Notes (Signed)
  Subjective:    Patient ID: [name], female    DOB: 12-06-1945, 65 y.o.   MRN: [id number]  HPI 10/30/10- 20 yo F former smoker, here with sister on referral from Celine Ahr at Dr Currie [place] office, concerned about dyspnea.. She had been a 1/2 PPD smoker. Hospitalized in February 2012 with gallstone pancreatitis. Started O2 then at 2 L/M Advanced. She denies lung disease prior, but would wake with cough before the [facility]. May not have noticed her breathing limitation before. Now DOE walking to mailbox or across parking lot on room air, not at rest..She is not aware of wheeze, chest pain, palpitation, purulent sputum, fluid retention or blood.D/C summary reviewed, noting D/C dx including hypercapneic respiratory failure, diastolic heart failure, NSTMI w/o CAD on cath.  She is pendng surgery for pancreatic pseudocyst.  Denies past hx of pneumonia, asthma or heart disease.  Had a remote episode of phlebitis w/o PE.  Medical problems include HBP, palpitation, hyperlipidemia.    Review of Systems Constitutional:   No weight loss, night sweats,  Fevers, chills, fatigue, lassitude. HEENT:   No headaches,  Difficulty swallowing,  Tooth/dental problems,  Sore throat,                No sneezing, itching, ear ache, nasal congestion, post nasal drip,   CV:  No chest pain,  Orthopnea, PND, swelling in lower extremities, anasarca, dizziness, palpitations  GI  No heartburn, indigestion, abdominal pain, nausea, vomiting, diarrhea, change in bowel habits, loss of appetite  Resp: .  No excess mucus, no productive cough,  No non-productive cough,  No coughing up of blood.  No change in color of mucus.  No wheezing.   Skin: no rash or lesions.  GU: no dysuria, change in color of urine, no urgency or frequency.  No flank pain.  MS:  No joint pain or swelling.  No decreased range of motion.  No back pain.  Psych:  No change in mood or affect. No depression or anxiety.  No memory loss.         Objective:   Physical Exam General- Alert, Oriented, Affect-appropriate, Distress- none acute   obese  Skin- rash-none, lesions- none, excoriation- none  Lymphadenopathy- none  Head- atraumatic  Eyes- Gross vision intact, PERRLA, conjunctivae clear, secretions  Ears- Hearing, canals, Tm - normal  Nose- Clear, No-Septal dev, mucus, polyps, erosion, perforation   Throat- Mallampati III-IV , mucosa clear , drainage- none, tonsils- atrophic   Repeated throat clearing  Neck- flexible , trachea midline, no stridor , thyroid nl, carotid no bruit  Chest - symmetrical excursion , unlabored     Heart/CV- RRR , no murmur , no gallop  , no rub, nl s1 s2                     - JVD- none , edema- none, stasis changes- none, varices- none     Lung- clear to P&A, wheeze- none, cough- none , dullness-none, rub- none     Chest wall-  Abd- tender-no, distended-no, bowel sounds-present, HSM- no  Br/ Gen/ Rectal- Not done, not indicated  Extrem- cyanosis- none, clubbing, none, atrophy- none, strength- nl.   Significant varices Right lower leg. Negative Homan's  Neuro- grossly intact to observation         Assessment & Plan:

## 2010-10-26 NOTE — Assessment & Plan Note (Addendum)
Oxygenating better. We will let her be off O2 at rest and will check home sats.  ARDS can be associated with pancreatitis, bu not clear if this is what happened.  She was discharged with dx of NSTMI w/o CAD, acute diastolic heart failure,

## 2010-10-26 NOTE — Patient Instructions (Addendum)
Office spirometry  Room air sat at rest now-   Orders-  Schedule PFT [facility]- Schedule home O2 assessment- Advanced- rest, exertion and sleep on room air.

## 2010-10-26 NOTE — Assessment & Plan Note (Addendum)
This was aggravated by abdominal tightness in hosp, but may be  persistent abnormality. We will schedule PFT .

## 2010-10-31 ENCOUNTER — Ambulatory Visit (HOSPITAL_COMMUNITY)
Admission: RE | Admit: 2010-10-31 | Discharge: 2010-10-31 | Disposition: A | Discharge status: Home / Self Care | Payer: Medicare HMO | Source: Ambulatory Visit | Attending: Gastroenterology | Admitting: Gastroenterology

## 2010-11-01 ENCOUNTER — Ambulatory Visit (HOSPITAL_COMMUNITY)
Admission: RE | Admit: 2010-11-01 | Discharge: 2010-11-01 | Disposition: A | Discharge status: Home / Self Care | Payer: Medicare HMO | Source: Ambulatory Visit | Attending: Gastroenterology | Admitting: Gastroenterology

## 2010-11-01 DIAGNOSIS — K859 Acute pancreatitis without necrosis or infection, unspecified: Secondary | ICD-10-CM | POA: Insufficient documentation

## 2010-11-01 DIAGNOSIS — G8929 Other chronic pain: Secondary | ICD-10-CM | POA: Insufficient documentation

## 2010-11-01 DIAGNOSIS — K219 Gastro-esophageal reflux disease without esophagitis: Secondary | ICD-10-CM | POA: Insufficient documentation

## 2010-11-01 DIAGNOSIS — G4733 Obstructive sleep apnea (adult) (pediatric): Secondary | ICD-10-CM | POA: Insufficient documentation

## 2010-11-01 DIAGNOSIS — I2510 Atherosclerotic heart disease of native coronary artery without angina pectoris: Secondary | ICD-10-CM | POA: Insufficient documentation

## 2010-11-01 DIAGNOSIS — R112 Nausea with vomiting, unspecified: Secondary | ICD-10-CM | POA: Insufficient documentation

## 2010-11-01 DIAGNOSIS — K862 Cyst of pancreas: Secondary | ICD-10-CM | POA: Insufficient documentation

## 2010-11-01 DIAGNOSIS — G20 Parkinson's disease: Secondary | ICD-10-CM | POA: Insufficient documentation

## 2010-11-01 DIAGNOSIS — G20A1 Parkinson's disease without dyskinesia, without mention of fluctuations: Secondary | ICD-10-CM | POA: Insufficient documentation

## 2010-11-01 DIAGNOSIS — E119 Type 2 diabetes mellitus without complications: Secondary | ICD-10-CM | POA: Insufficient documentation

## 2010-11-01 LAB — GLUCOSE, CAPILLARY: Glucose-Capillary: 207 mg/dL — ABNORMAL HIGH (ref 70–99)

## 2010-11-05 ENCOUNTER — Encounter: Payer: Self-pay | Admitting: Internal Medicine

## 2010-11-07 NOTE — Cardiovascular Report (Signed)
NAMEMOZEL, BURDETT NO.:  [id number]   MEDICAL RECORD NO.:  [id number]          PATIENT TYPE:  OUT   LOCATION:  CATH                         FACILITY:  [facility]   PHYSICIAN:  Ricki Rodriguez, M.D.  DATE OF BIRTH:  04-28-1946   DATE OF PROCEDURE:  07/10/2007  DATE OF DISCHARGE:                            CARDIAC CATHETERIZATION   LOCATION:  [facility].   PROCEDURES:  The left heart catheterization, selective coronary  angiography, left ventricle function study.   INDICATIONS:  This 65 year old black female with a cardiac risk factors  of obesity and family history of coronary artery disease, had  unexplained syncopal episode with history for hypertension.   APPROACH:  Right femoral artery using 4-French sheath.   COMPLICATIONS:  None.   DYE:  Forty four mL of dye used.   HEMODYNAMIC DATA:  The left ventricular pressure was 142/10 mmHg and  aortic pressure was 142/82 mmHg.   CORONARY ANATOMY:  The left main coronary artery was unremarkable.   Left anterior descending coronary artery.  The left anterior descending  coronary artery and its diagonal branches were unremarkable.   Left circumflex coronary artery.  The left circumflex coronary artery  was dominant and was essentially unremarkable and had a small posterior  descending coronary artery, the distal half of the posterior septum was  supplied by left anterior descending coronary artery.  Right coronary artery.  The right coronary artery was small and  unremarkable.   LEFT VENTRICULOGRAM:  The left ventriculogram was normal with ejection  fraction of 65%.   IMPRESSION:  1. Normal coronaries.  2. Normal left ventricular systolic function.   RECOMMENDATIONS:  This patient will be started on beta-blocker for her  possible vasovagal syncope.     Ricki Rodriguez, M.D.  Electronically Signed    ASK/MEDQ  D:  07/11/2007  T:  07/11/2007  Job:  [id number]

## 2010-11-07 NOTE — Discharge Summary (Signed)
[name], [name] NO.:  [id number]   MEDICAL RECORD NO.:  [id number]          PATIENT TYPE:  INP   LOCATION:  3705                         FACILITY:  [facility]   PHYSICIAN:  Michaelyn Barter, M.D. DATE OF BIRTH:  [birth year]   DATE OF ADMISSION:  11/27/2007  DATE OF DISCHARGE:  11/29/2007                               DISCHARGE SUMMARY   FINAL DIAGNOSES:  1. Altered mental status.  2. Newly-diagnosed seizure.   PROCEDURES:  1. CT scan of the head without contrast completed November 27, 2007.  2. EEG completed November 28, 2007.   CONSULTATIONS:  Gustavus Messing. Orlin Hilding, M.D.   HISTORY OF PRESENT ILLNESS:  The patient is a 65 year old female who  indicated that she entered her house and experienced a funny feeling.  She then went to her bedroom and later she was found by her son passed  out.  She was foaming at the mouth and her hands were jerking as well as  her feet.  She experienced an episode of bladder incontinence.  EMS was  called and the patient was transferred to the hospital.   For past medical history, please see that dictated by [name].   HOSPITAL COURSE:  1. Altered mental status.  By description it appears that the patient      had a seizure episode.  When the patient initially arrived into the      emergency department she appeared to be in a deep sleep as if she      was postictal.  Later she aroused and appeared to be very drowsy.      A CT scan of her head was completed on June 4.  No acute      intracranial findings were noted.  Dilantin was initiated and      neurology was consulted.  Dr. Orlin Hilding saw the patient on June 5.      She indicated that the patient's event that she experienced seemed      fairly clear to be a seizure episode, increasing in the likelihood      that her prior events were also seizure-related.  The etiology,      however, was unclear.  An EEG was completed during this hospital      course on June 5.  It appeared to be  normal in the awake, drowsy      and light natural sleep states without seizure activity or focal      abnormalities seen during the course of the recording.  Over the      course of hospitalization the patient did not have any repeat      episodes of seizure activity  2. Hypokalemia.  Potassium supplementation was provided to the patient      during this hospital course.  3. Hypertension.  The patient's blood pressure remained relatively      stable.   On the date of discharge the patient had no new complaints.  Her  temperature was 98.8, heart rate 62, respirations 18, blood pressure  126/82, O2 saturation 97% on room air.  The  decision was made to  discharge the patient from the hospital back to her home.  She was  discharged on Dilantin 300 mg p.o. daily.  She was instructed to call  Dr. Smith Mince within 1 week to have a Dilantin level checked and also to  follow up with Dr. Orlin Hilding on June 12.      Michaelyn Barter, M.D.  Electronically Signed     OR/MEDQ  D:  11/29/2007  T:  11/29/2007  Job:  [id number]   cc:   Santina Evans A. Orlin Hilding, M.D.  Talmadge Coventry, M.D.

## 2010-11-07 NOTE — Procedures (Signed)
EEG NUMBER:  Not given.   HISTORY:  This is a 65 year old with headaches and visual changes who is  having an EEG done to evaluate for seizure activity.   PROCEDURE:  This is a portable EEG.   TECHNICAL DESCRIPTION:  Throughout this portable EEG, there was a  posterior derm rhythm of 10 Hz activity at 30-40 microvolts, background  activity is fairly symmetric mostly apprised of alpha range activity at  25-40 microvolts.  Photic stimulation nor hyperventilation were  performed throughout this record.  The patient does not enter stage 2  sleep during this tracing.  Throughout this record, there is no evidence  of electrographic seizures or interictal discharge activity.  The EKG  tracing shows a heart rate of 50-70 beats per minute with an occasional  premature beat noted.   IMPRESSION:  This portable EEG is within normal limits in the awake  state. One should note on the EKG tracing there is and occasional  premature beat noted.  Clinical correlation is advised.      [name]. Nash Shearer, M.D.  Electronically Signed     ZOX:WRUE  D:  07/11/2007 09:15:15  T:  07/11/2007 09:57:09  Job #:  [id number]   cc:   Santina Evans A. Orlin Hilding, M.D.  Fax: [phone]

## 2010-11-07 NOTE — Procedures (Signed)
EEG NUMBER:  [id number]   HISTORY:  This is a 65 year old patient with frequent seizure events.  The patient is being evaluated for the seizures.  This is a portable EEG  recording.  No skull defects noted.  Medications include Xanax, Celexa,  Lovenox, Glucophage, Lopressor, Protonix, Dilantin, Zocor, Tylenol,  Ativan, and Zofran.   The patient is also on Keppra.   EEG classification, normal awake and drowsy.   DESCRIPTION:  The background rhythms of this recording consists of a  fairly well-modulated, medium amplitude, alpha rhythm of 9 Hz that is  reactive to eye opening and closure.  As the record progresses, the  patient spends most of the recording in the awake state.  There are  occasional brief episodes of mild drowsiness with a generalized slowing  seen during this period of time.  The patient never enter stage II  sleep.  Photic stimulation and hyperventilation were not performed.  No  time during the recording appear to be evidence of spike, spike-wave  discharges, or evidence of focal slowing.  EKG monitor shows no evidence  of cardiac rhythm abnormalities and a heart rate of 72.   IMPRESSION:  This is a normal EEG recording in awake and drowsy state.  No evidence of ictal or interictal discharges were seen.      Marlan Palau, M.D.  Electronically Signed     ZOX:WRUE  D:  05/05/2008 15:51:05  T:  05/06/2008 02:57:27  Job #:  [id number]
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
## 2010-11-07 NOTE — Consult Note (Signed)
NAMEFLOYD, [name]               ACCOUNT NO.:  [id number]   MEDICAL RECORD NO.:  [id number]          PATIENT TYPE:  INP   LOCATION:  1435                         FACILITY:  [facility]   PHYSICIAN:  Marlan Palau, M.D.  DATE OF BIRTH:  June 27, 1945   DATE OF CONSULTATION:  05/05/2008  DATE OF DISCHARGE:                                 CONSULTATION   TIME SEEN:  10:21.   CHIEF COMPLAINT:  Seizure.   HISTORY OF PRESENT ILLNESS:  A 65 year old [place] American female with  history of seizure.  She was brought to the [facility] ED secondary to  seizure activity in her primary care office as she went to her primary  care for low back pain.  While she was in the office, primary care noted  seizure activity and called EMS.  The patient was brought to [facility], loaded with 1 g dilantin ER and dilation level was taken prior to  loading, which showed 2.8.  She recalls going to the primary care  physician's office and then recalls waking up in the ambulance.  Recalls  being very drowsy and very tired, and sore.  Does not recall biting her  tongue or having incontinence.  According to the patient, she was  recently started on Keppra and taken off of dilantin.  She states that,  while she has been on Keppra, she has had approximately 1 seizure q.2  weeks.  It should also be noted that she states that she was having  breakthrough seizures while she was also on dilantin.  The patient was  consulted by Dr. Orlin Hilding in June 2009 for seizures.  At that time, the  patient had previously been seen in her office, but failed to show up in  followup.  EEG in June of 2009 showed normal while she was in the  hospital.  On that visit, the patient was placed on 300 mg dilantin  nightly and was to follow up with Dr. Orlin Hilding.  The patient followed  with Dr. Orlin Hilding on March 05, 2008 and was to continue on dilantin  200 b.i.d.  No Keppra was noted.  Since then, she has been seen in the  ED for seizures in  August 2009 with a dilantin level of 14.1.  October  of 2009 with a dilantin of 9.3.  Again in October 2009 with a dilantin  of 11.1.  The patient states that she had seen [place] in October.  However, there was no note noted that was faxed at this time.  She  states that, while she was in the office in October, Tina had told her  to taper off of the dilantin and continue with Keppra 1 g b.i.d.  The  patient states that she took 3 days to get off of her dilantin  completely, but continued Keppra.  She has been taking the Keppra  continuously and has not missed a dose.  As far as medication changes,  the patient states that she has increased the dose of her cholesterol  medications and placed on metformin 500 mg p.o. nightly.  The  patient  states that she is still very depressed and has decreased sleep.   PAST MEDICAL HISTORY:  Hypertension, diabetes, high cholesterol, and  seizure disorder.   SURGICAL HISTORY:  Hysterectomy, appendectomy, and tubal ligation.   MEDICATIONS:  1. Xanax 0.25 mg p.o. nightly.  2. Celexa 10 mg p.o. daily.  3. Lovenox 55 mg subq daily.  4. Keppra 1 g IV b.i.d.  5. Glucophage 500 mg p.o. nightly.  6. Lopressor 25 mg p.o. b.i.d.  7. Protonix 40 mg IV q.22 hours.  8. Dilantin 100 mg p.o. t.i.d.  9. Zocor 10 mg p.o. q.18.   ALLERGIES:  She is allergic to PENICILLIN and SULFA.   SOCIAL HISTORY:  Does not smoke, drink, or do illicit drugs.   REVIEW OF SYSTEMS:  12 review of systems were completed with all  negative with the exception above.   PHYSICAL EXAMINATION:  CONSTITUTIONAL:  This is a pleasant morbidly  obese 65 year old [place] American female.  VITAL SIGNS:  Blood pressure 134/62, pulse 78, respirations 21,  temperature 98.1.  MENTAL STATUS:  Alert and oriented x2.  Carries out 2- and 3-step  commands without difficulty.  Pupils are equal, round, and reactive to  light and accommodation.  Conjugate gaze.  Extraocular muscles intact.  No ptosis.   Visual fields are intact.  The patient does have a  difficulty opening her eyes all the way.  However, I am not sure if this  is organic or the patient is just significantly tired.  The patient also  states that light significantly hurts her eyes.  She has no bite or  lacerations on her tongue.  Face is symmetrical, tongue is midline at  this time.  Sensation V1 through V3 are normal.  Head shrug, shoulder  tilt normal.  Coordination, finger to nose, she has slight dysmetria at  the endpoint, missing my finger with bilateral hands.  However, I am not  really sure if she is giving full effort, and she also has significant  weakness in her arms.  Her heel to shin was not tested as she has  significant pain in her right sciatic region and has difficulty getting  her heel to her shin.  However, on her left side, heel to shin was  normal.  Fine motor movements were very slow.  No dysmetria.  Gait not  tested.  Motor 4/5 strength bilateral upper extremities with poor  effort.  5/5 strength bilateral lower extremities, poor effort.  Right  leg with less effort than left leg secondary to pain.  No clonus.  Decreased bulk, decreased tone.  Deep tendon reflexes 2+ on the upper  extremities, 1+ in the right patella, 2+ on the left patella.  Downgoing  toes bilaterally.  Equivocal Achilles reflexes.  Sensation, she states  that she has decreased sensation to pinprick and light touch on her  right lateral foot going up her right lateral calf to the posterior knee  and the back of the thigh, and over along the x1 distribution.  All  other sensation is globally intact to pinprick, light touch, and  vibration.   LABS:  Dilantin is 8.2 at this time, taken today.  Homocystine level not  taken.  Urinalysis negative.  ALT 32, AST 51.  Sodium 142, potassium  3.9, chloride 105, CO2 30, BUN 10, creatinine 0.78, glucose 117, white  blood cells 7.9, platelets 243,000.  Hemoglobin and hematocrit 12.9 and  40.1.   Triglycerides 111, cholesterol 202, HDL 53, and LDL  is 127.   IMAGING AND TESTS:  Head CT shows normal.   ASSESSMENT AND PLAN:  At this time, the patient is now on dilantin and  Keppra 1 g b.i.d.  Would recommend continuing dilantin and Keppra.  Will  obtain an EEG in the a.m.  If normal, differential diagnosis would be a  pseudoseizure versus seizure.  Seeing that she is on 2 medications, we  would recommend a 24 hour EEG at that point in time.  We will continue  to follow this patient.  Thank you very much for the consultation.     ______________________________  [name], PA-C      C. Lesia Sago, M.D.  Electronically Signed    DS/MEDQ  D:  05/05/2008  T:  05/05/2008  Job:  [id number]   cc:   Marlan Palau, M.D.  Fax: [phone]   [facility]

## 2010-11-07 NOTE — H&P (Signed)
[name], SCRIPTER NO.:  [id number]   MEDICAL RECORD NO.:  [id number]          PATIENT TYPE:  INP   LOCATION:  1435                         FACILITY:  [facility]   PHYSICIAN:  Eduard Clos, MDDATE OF BIRTH:  May 16, 1946   DATE OF ADMISSION:  05/04/2008  DATE OF DISCHARGE:                              HISTORY & PHYSICAL   PRIMARY CARE PHYSICIAN:  The patient's primary care physician is  Talmadge Coventry, M.D.   NEUROLOGIST:  The patient's primary neurologist is [name].   INFORMANT:  History is obtained from the patient, ER records and the  patient's sister.   CHIEF COMPLAINT:  Seizure-like activity.   HISTORY OF THE PRESENTING ILLNESS:  The patient is a 65 year old female  with a known history of seizure, history of diabetes mellitus type 2,  hyperlipidemia, and hypertension who was brought into the ER after the  patient had seizure-like activity in her PCP's office.  The patient had  originally went to her PCP's office because of low back pain radiating  to her left leg making it difficult for her to walk due to the pain.  In  the PCP's office she started developing seizure-like activity wherein  the PCP called the ambulance and she was brought to the ER.  In the ER  the patient again had seizure-like activity, which lasted around 1-2  minutes as was witnessed by his sister, which was generalized tonic  clonic and she has loss of consciousness after that and had urinary  incontinence.  The patient at this time is alert and awake, and has  already received 1 gram of IV Dilantin loading dose.  On further  questioning the patient states that she was recently started on Keppra  and she had stopped taking Dilantin because of recurrent seizures.  She  has been getting at least one seizure-like activity every 2 weeks since  last her discharge in June.  The patient denies any chest pain,  shortness of breath, weakness of limbs, any headache, fever,  chills,  abdominal pain, dysuria, discharges, or diarrhea.   PAST MEDICAL HISTORY:  1. Seizure disorder.  2. Hypertension.  3. Hyperlipidemia.  4. Diabetes mellitus Type 2.   PAST SURGICAL HISTORY:  1. Hysterectomy.  2. Appendectomy.  3. Tubal ligation.   MEDICATIONS:  The patient's medications prior to admission included:  1. Keppra 1,000 mg by mouth twice daily.  2. The patient had stopped taking Dilantin.  3. Pravastatin 20 mg 2 at bedtime.  4. Metoprolol 25 by mouth twice a day.  5. Xanax 0.25 by mouth at bedtime.  6. __________  daily.  7. Metformin 300 mg daily at bedtime.  8. Aleve as needed for pain.  9. Multivitamin.   ALLERGIES:  PENICILLIN AND CODEINE.   SOCIAL HISTORY:  The patient lives with her sister.  Denies smoking  cigarettes, drinking alcohol or using illegal drugs.   FAMILY HISTORY:  The family history reveals nothing contributory.   REVIEW OF SYSTEMS:  The review of systems is per the history of  presenting illness and there is nothing else of significance.  PHYSICAL EXAMINATION:  GENERAL APPEARANCE:  The patient is examined at  the bedside and is not in acute distress.  VITAL SIGNS:  Blood pressure 120/50, pulse 70/minute, temperature 98.5,  respirations 18, and O2 sat 96%.  HEENT:  Anicteric.  No pallor.  PERRLA.  No facial asymmetry.  Tongue is  midline.  CHEST:  Bilateral air entry present.  No rhonchi.  No crepitations.  HEART:  S1 and S2 are heard.  ABDOMEN:  The abdomen is soft and nontender.  Bowel sounds heard.  No  guarding or rigidity.  NEUROLOGIC EXAMINATION:  Central nervous system - alert and oriented to  time, place and person.  EXTREMITIES:  The patient moves the upper and lower extremities.  Strength 5/5.  The peripheral pulses are felt.  No edema.   LABORATORY DATA:  CT of the thoracic spine shows no significant  abnormality of the thoracic spine.  Lumbar spine shows degenerative disk  disease a L4,  L5 and L5, S1;  essentially unchanged since her prior CT;  no acute abnormality.  CBC; WBCs 7.9, hemoglobin 12.9, hematocrit 40.1  and platelets 243,000 with neutrophils 69%.  Complete metabolic panel;  sodium 142, potassium 3.9, chloride 105, carbon dioxide 30, glucose 86,  BUN 10, and creatinine 0.7.  Total bilirubin 0.6, alkaline phosphate  143, AST 32, ALT 51, albumin 3.6, and calcium 9.3.  Dilantin level 2.8.  UA is negative for nitrites, leukocytes, ketones and glucose.   ASSESSMENT:  1. Seizure disorder, recurrent.  2. Diabetes mellitus Type 2.  3. Hypertension.  4. Hyperlipidemia.   PLAN:  1. Admit the patient to telemetry.  2. We will place the patient on IV Keppra and the patient has already      received a Dilantin loading dose.  3. We will recheck the Dilantin level in the a.m. and place her on 100      mg by mouth three times a day for now until we get a formal      neurologic consult.  4. Obtain a CT of the head.  5. Place on neuro checks.  6. Further recommendations will be made as the patient's condition      evolves.      Eduard Clos, MD  Electronically Signed     ANK/MEDQ  D:  05/04/2008  T:  05/05/2008  Job:  [id number]

## 2010-11-07 NOTE — Procedures (Signed)
EEG NUMBER:  [id number].   ORDERING PHYSICIAN:  Lonia Blood, MD   This is a 65 year old woman who is admitted for syncope, possible  seizure episode witnessed by son.  The patient jerking, does not recall  the episode.  She has hypertension, obesity, and dyslipidemia.   MEDICATIONS:  Include Lovenox, aspirin, Protonix, Dilantin, Phenergan,  Zofran, Tylenol, and oxycodone.   This was a routine 17-channel EEG with one channel devoted to EKG,  utilizing international 10/20 lead placement system.  The patient was  described clinically as being awake and asleep through the course of the  study.  Electrographically, she also did appear to be awake and asleep.  While awake, the background consisted of a well-organized, well-  developed, well-modulated 10-Hz alpha activity, which is predominant in  the posterior head regions and reactive to eye opening.  No  interhemispheric asymmetry is identified, and no definite epileptiform  discharges are seen.  There was attenuation in the background with  decreased frequency and amplitude during drowsiness, and some central  sharp activity was seen as well.  Photic stimulation did not produce any  significant change in the background activity.  Hyperventilation  likewise produced no significant change in the background activity.  EKG  monitor reveals relatively regular rhythm with a rate of 72 beats per  minute.   CONCLUSION:  Normal EEG in awake, drowsy, and light natural sleep states  without seizure activity or focal abnormality seen during the course of  today's recording.  Clinical correlation is recommended.      Catherine A. Orlin Hilding, M.D.  Electronically Signed     JYN:WGNF  D:  11/28/2007 18:05:53  T:  11/29/2007 03:01:42  Job #:  [id number]
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
## 2010-11-07 NOTE — Discharge Summary (Signed)
[name], [name] NO.:  [id number]   MEDICAL RECORD NO.:  [id number]          PATIENT TYPE:  INP   LOCATION:  1405                         FACILITY:  [facility]   PHYSICIAN:  Jarome Matin, M.D.DATE OF BIRTH:  06/23/1946   DATE OF ADMISSION:  07/08/2007  DATE OF DISCHARGE:  07/11/2007                               DISCHARGE SUMMARY   ADMITTING DIAGNOSIS:  Syncope and blurred vision.   BRIEF HISTORY, PHYSICAL, AND HOSPITAL COURSE:  [name] is a 65-year-  old African American female.  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  Two nights prior to this  admission she had been working and she had sort of a blurry waning  vision and her pressure had been fine.  She worked and then she left  there and went to her next job and worked and went home that night and  was slowing down before going to bible study and the next thing she knew  she was being awakened by EMS with oxygen on.  Apparently, she had  passed out and they could not revive her.  She was brought to the  emergency room and was admitted.   PHYSICAL EXAMINATION:  GENERAL:  She was alert but a little drowsy upon  awakening in the emergency room.  VITAL SIGNS:  Temp was 98.  Her pulse was 67.  Respirations 18.  Blood  pressure 139/71, 98% on room air.  The patient has a history of  hypertension and some arthritis.  She has been taking some arthritis  medicine and some hydrochlorothiazide for her blood pressure.  On exam  she was found negative.   CT of her brain was essentially negative except for some __________  her  MRI.  Neurology saw the patient and an MRI showed some mild  atherosclerosis and mild narrowing of the right sub-clavicle artery and  the jugular artery.  On telemetry she had a normal sinus rhythm with an  occasional extra beat and arrhythmia.  EEG was done by neurology which  did not show any evidence of seizures.  It was essentially  negative.  Cardiologist doctor saw the patient and did a cath and it was  essentially negative.  Ejection fraction was about 65%.  She had  occasional PVC.   After all was said and done, it seems that she basically is a 65-year-  old lady who was very fatigued having some blurry vision possibly a  syncopal episode but coronary arteries looked fine and she had no  stroke, no real evidence of a TIA.  The cardiologist thought she might  do well by being put on a beta-blocker, keep her on that.  She was  started on some Metoprolol, generic Toprol, 25 mg b.i.d.  We will just  keep her on that and discharge her home.  Cardiology wanted to see her  in about 2 weeks.  I will see her in the office in 2-3 weeks.           ______________________________  Jarome Matin,  M.D.     CEF/MEDQ  D:  07/11/2007  T:  07/11/2007  Job:  [id number]
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
## 2010-11-07 NOTE — Consult Note (Signed)
[name] [name]               ACCOUNT NO.:  [id number]   MEDICAL RECORD NO.:  [id number]          PATIENT TYPE:  INP   LOCATION:  1333                         FACILITY:  [facility]   PHYSICIAN:  Gustavus Messing. Orlin [name], M.D.DATE OF BIRTH:  04/02/1946   DATE OF CONSULTATION:  07/09/2007  DATE OF DISCHARGE:                                 CONSULTATION   CHIEF COMPLAINT:  Syncopal spell.   HISTORY OF PRESENT ILLNESS:  [name] [name] is a 65 year old African  American woman with a history of hypertension.  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  Two nights ago she was at  work and complained of wavy vision.  A coworker checked her blood  pressure and it was fine.  She was able to finish her shift and go about  her business at home the next day but felt drained the whole time, she  says.  Later that night, last night, she had cooked dinner and was  getting ready to go to church but she suddenly felt very fatigued and  felt like she had to sit down and she felt weak all over.  She had no  focal neurologic complaints, no headache at that time or any neurologic  symptoms.  She was on the phone talking with her sister saying she did  not have the energy to even get up and go to bed.  She did have some  heart fluttering.  She did not otherwise feel like she was going to  faint.  She had no unilateral numbness or weakness or headache at the  time or slurred speech or anything to suggest a TIA or stroke.  That is  the last thing she remembers until EMS was standing over her.  She  apparently collapsed without a fall or injury.  It is unclear whether  she had any incontinence.  It does not appear that she had any  convulsive activity or tongue-biting.  When she came to, she did  complain of a headache and she was given something in the ER, she says,  and by then the headache went away and it has not returned.  She does  have a prior history of migraines with some neurologic  symptomatology,  vision problems other things, but she has not had anything like that  happen for some time now.  So far she continues to feel mildly weak but  has been able to get up and walk to the bathroom.   REVIEW OF SYSTEMS:  A 13-system review was performed.  She denies any  pain currently.  Has a history of migraine, some arthritis-type pains  with joint pain and swelling.  Some urinary stress incontinence.  Requires glasses.  Has dentures.  Not suicidal.  Does have some problems  sleeping.   PAST MEDICAL HISTORY:  1. Hypertension.  2. She denies hypercholesterolemia but she is on a statin drug.  3. She denies diabetes.  4. She denies a stroke but there is a history in the chart that says      she had a stroke  in the past.  5. She has had migraines in the past but none for many years.  They      were complex migraines with neurologic symptomatology.  6. She has had back surgery, tubal ligation, hysterectomy and      tonsillectomy in the past.  7. She is obese.   The best we can tell, her medications are metoclopramide 10 mg p.r.n.,  Maxzide 37.5/25 mg once a day, pravastatin 20 mg daily, and Usana, which  is a dietary supplement for energy.   ALLERGIES:  PENICILLIN, CODEINE.   SOCIAL HISTORY:  She is divorced, has two sons, one of whom, a 17-year-  old, lives with her.  She works two jobs.  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  She does not smoke, drink  alcohol, use recreational drugs.   FAMILY HISTORY:  Noncontributory.   OBJECTIVE:  VITAL SIGNS:  Temperature is 98.7, pulse 67, respirations  18, BP 134/71, 98% saturation on room air.  HEAD:  Normocephalic, atraumatic.  NECK:  Supple without bruits.  HEART:  Regular rate and rhythm.  LUNGS:  Clear to auscultation.  She is obese.  EXTREMITIES:  Without edema.  Mucosa is moist.  NEUROLOGIC:  She is awake, alert and appropriate, with normal language  and cognition.  Her visual  fields are full.  Pupils are equal and  reactive.  Extraocular movements are intact.  Facial sensation is  normal.  Facial motor activity is normal.  Hearing is intact.  Palate is  symmetric and tongue is midline.  There is no tongue laceration.  She  has good shoulder shrug.  On motor exam, there is no drift or  satelliting.  She has normal rapid fine movement.  She has normal bulk,  tone and strength throughout with 5/5 strength in all four extremities  with the exception of mildly decreased grip strength bilaterally  nonspecifically.  Deep tendon reflexes are absent.  She has downgoing  toes to plantar stimulation.  coordination:  Finger-to-nose and heel-to-  shin are normal.  Sensory is grossly normal to light touch.   CT of the brain is essentially normal.  BMET is essentially normal.  So  is the liver function profile, and CBC is normal.   IMPRESSION:  Syncope, sounds like a neurocardiogenic event.  Consider  seizure or vertebrobasilar insufficiency, although I doubt both, or a  possible cardiac source.  She had a history of neurologic migraine and  some headache associated with this event, but it was NOT typical of her  migraine in the past, and syncope is NOT typically associated with  migraine. She is headache-free now.   RECOMMENDATIONS:  Will order an EEG, MRI of the brain and MRA of the  head and neck with and without contrast.  I recommend telemetry.  Would  consider cardiology consult.  I will leave that to Dr. Bascom Levels  to order  if he deems it appropriate.      [name] [name], M.D.  Electronically Signed     CAW/MEDQ  D:  07/09/2007  T:  07/09/2007  Job:  [id number]

## 2010-11-21 ENCOUNTER — Encounter: Payer: Self-pay | Admitting: Cardiovascular Disease

## 2010-11-27 ENCOUNTER — Other Ambulatory Visit: Payer: Self-pay | Admitting: Gastroenterology

## 2010-11-27 DIAGNOSIS — K863 Pseudocyst of pancreas: Secondary | ICD-10-CM

## 2010-12-25 ENCOUNTER — Ambulatory Visit
Admission: RE | Admit: 2010-12-25 | Discharge: 2010-12-25 | Disposition: A | Discharge status: Home / Self Care | Payer: Medicare HMO | Source: Ambulatory Visit | Attending: Gastroenterology | Admitting: Gastroenterology

## 2010-12-25 DIAGNOSIS — K863 Pseudocyst of pancreas: Secondary | ICD-10-CM

## 2010-12-25 MED ORDER — IOHEXOL 300 MG/ML  SOLN
125.0000 mL | Freq: Once | INTRAMUSCULAR | Status: AC | PRN
  Administered 2010-12-25: 125 mL via INTRAVENOUS

## 2010-12-28 ENCOUNTER — Ambulatory Visit (INDEPENDENT_AMBULATORY_CARE_PROVIDER_SITE_OTHER): Payer: Medicare HMO | Admitting: Internal Medicine

## 2010-12-28 ENCOUNTER — Other Ambulatory Visit: Payer: Medicare HMO

## 2010-12-28 ENCOUNTER — Encounter: Payer: Self-pay | Admitting: Internal Medicine

## 2010-12-28 ENCOUNTER — Ambulatory Visit (INDEPENDENT_AMBULATORY_CARE_PROVIDER_SITE_OTHER)
Admission: RE | Admit: 2010-12-28 | Discharge: 2010-12-28 | Disposition: A | Discharge status: Home / Self Care | Payer: Medicare HMO | Source: Ambulatory Visit | Attending: Internal Medicine | Admitting: Internal Medicine

## 2010-12-28 VITALS — BP 134/72 | HR 71 | Ht 61.0 in | Wt 244.0 lb

## 2010-12-28 DIAGNOSIS — J9602 Acute respiratory failure with hypercapnia: Secondary | ICD-10-CM

## 2010-12-28 DIAGNOSIS — J9690 Respiratory failure, unspecified, unspecified whether with hypoxia or hypercapnia: Secondary | ICD-10-CM

## 2010-12-28 DIAGNOSIS — J9620 Acute and chronic respiratory failure, unspecified whether with hypoxia or hypercapnia: Secondary | ICD-10-CM

## 2010-12-28 DIAGNOSIS — E662 Morbid (severe) obesity with alveolar hypoventilation: Secondary | ICD-10-CM

## 2010-12-28 LAB — PULMONARY FUNCTION TEST

## 2010-12-28 NOTE — Assessment & Plan Note (Addendum)
Her obese body habitus, Restriction and reduced DLCO go along with this. Any success she can achieve with walking and weight loss will help.

## 2010-12-28 NOTE — Progress Notes (Signed)
Subjective:    Patient ID: [name], female    DOB: 1945-08-09, 65 y.o.   MRN: [id number]  HPI    Review of Systems     Objective:   Physical Exam        Assessment & Plan:   Subjective:    Patient ID: [name], female    DOB: [birth year], 65 y.o.   MRN: [id number]  HPI 10/30/10- 72 yo F former smoker, here with sister on referral from Celine Ahr at Dr Currie [place] office, concerned about dyspnea.. She had been a 1/2 PPD smoker. Hospitalized in February 2012 with gallstone pancreatitis. Started O2 then at 2 L/M Advanced. She denies lung disease prior, but would wake with cough before the [facility]. May not have noticed her breathing limitation before. Now DOE walking to mailbox or across parking lot on room air, not at rest..She is not aware of wheeze, chest pain, palpitation, purulent sputum, fluid retention or blood.D/C summary reviewed, noting D/C dx including hypercapneic respiratory failure, diastolic heart failure, NSTMI w/o CAD on cath.  She is pendng surgery for pancreatic pseudocyst.  Denies past hx of pneumonia, asthma or heart disease.  Had a remote episode of phlebitis w/o PE.  Medical problems include HBP, palpitation, hyperlipidemia.   12/28/10- 68 yo F former smoker followed for respiratory failure with hypercapnia, OHS, complicated by diastolic CHF Says she is doing pretty well. Uses O2 at 2 L/M for sleep and if out in heat/ prn, but not usually needed around the house. Wheezes some on steps. Coughs occasionally with scant white phlegm. Occasional twinge shoots left upper anterior chest, no palpitation. Legs swell some and occasional cramp in tight calf, relieved by stretch.  CXR 08/25/10- retrocardiac atelectasis, small effusions, CE PFT- 12/29/10- Mild restriction and reduction of DLCO. FEV1/FVC 0.86, TLC 68%; DLCO 60%.  Review of Systems Constitutional:   No weight loss, night sweats,  Fevers, chills, fatigue, lassitude. HEENT:   No headaches,   Difficulty swallowing,  Tooth/dental problems,  Sore throat,                No sneezing, itching, ear ache, nasal congestion, post nasal drip,   CV:  No chest pain,  Orthopnea, PND, swelling in lower extremities, anasarca, dizziness, palpitations  GI  No heartburn, indigestion, abdominal pain, nausea, vomiting, diarrhea, change in bowel habits, loss of appetite  Resp: .  No excess mucus, no productive cough,  No non-productive cough,  No coughing up of blood.  No change in color of mucus.  No wheezing.   Skin: no rash or lesions.  GU: no dysuria, change in color of urine, no urgency or frequency.  No flank pain.  MS:  No joint pain or swelling.  No decreased range of motion.  No back pain.  Psych:  No change in mood or affect. No depression or anxiety.  No memory loss.       Objective:   Physical Exam General- Alert, Oriented, Affect-appropriate, Distress- none acute   Obese   O2 at 2L/M Skin- rash-none, lesions- none, excoriation- none  Lymphadenopathy- none  Head- atraumatic  Eyes- Gross vision intact, PERRLA, conjunctivae clear secretions  Ears- Hearing, canals, Tm - normal  Nose- Clear, No-Septal dev, mucus, polyps, erosion, perforation   Throat- Mallampati III-IV , mucosa clear , drainage- none, tonsils- atrophic    Neck- flexible , trachea midline, no stridor , thyroid nl, carotid no bruit  Chest - symmetrical excursion , unlabored  Heart/CV- RRR , 1/6 systolic murmur left upper sternal border, no gallop  , no rub, nl s1 s2                     - JVD- 1 cm , edema- none, stasis changes- none, varices- none     Lung- clear to P&A, wheeze- none, cough- none , dullness-none, rub- none     Chest wall-  Abd- tender-no, distended-no, bowel sounds-present, HSM- no  Br/ Gen/ Rectal- Not done, not indicated  Extrem- cyanosis- none, clubbing, none, atrophy- none, strength- nl.   Significant varices Right lower leg. Negative Homan's again.   Neuro- grossly intact to  observation         Assessment & Plan:

## 2010-12-28 NOTE — Assessment & Plan Note (Addendum)
There may be a minor reactive component. I will let her try Advair for effect.  We are going to recheck CXR because of atelectasis in March, and check a D-dimer. She has past hx phlebitis and the persistent CXR abnormality might be an area of infarction.

## 2010-12-28 NOTE — Patient Instructions (Addendum)
Sample Advair 250/50- 1 puff and rinse mouth well, twice every day. Script to be filled if helpful.  Orders- CXR- acute respiratory failure               D-dimer    Any walking you can do will help build stamina and lose weight, which will help you feel better.

## 2010-12-28 NOTE — Progress Notes (Signed)
PFT done today. 

## 2010-12-29 LAB — D-DIMER, QUANTITATIVE: D-Dimer, Quant: 1.85 ug/mL-FEU — ABNORMAL HIGH (ref 0.00–0.48)

## 2010-12-31 ENCOUNTER — Emergency Department (HOSPITAL_COMMUNITY)
Admission: EM | Admit: 2010-12-31 | Discharge: 2010-12-31 | Disposition: A | Discharge status: Home / Self Care | Payer: Medicare HMO | Attending: Emergency Medicine | Admitting: Emergency Medicine

## 2010-12-31 DIAGNOSIS — R109 Unspecified abdominal pain: Secondary | ICD-10-CM | POA: Insufficient documentation

## 2010-12-31 DIAGNOSIS — Z9089 Acquired absence of other organs: Secondary | ICD-10-CM | POA: Insufficient documentation

## 2010-12-31 DIAGNOSIS — Z79899 Other long term (current) drug therapy: Secondary | ICD-10-CM | POA: Insufficient documentation

## 2010-12-31 DIAGNOSIS — Z794 Long term (current) use of insulin: Secondary | ICD-10-CM | POA: Insufficient documentation

## 2010-12-31 DIAGNOSIS — I10 Essential (primary) hypertension: Secondary | ICD-10-CM | POA: Insufficient documentation

## 2010-12-31 DIAGNOSIS — K861 Other chronic pancreatitis: Secondary | ICD-10-CM | POA: Insufficient documentation

## 2010-12-31 DIAGNOSIS — E119 Type 2 diabetes mellitus without complications: Secondary | ICD-10-CM | POA: Insufficient documentation

## 2010-12-31 LAB — COMPREHENSIVE METABOLIC PANEL
ALT: 13 U/L (ref 0–35)
AST: 16 U/L (ref 0–37)
Albumin: 3.7 g/dL (ref 3.5–5.2)
Alkaline Phosphatase: 119 U/L — ABNORMAL HIGH (ref 39–117)
BUN: 8 mg/dL (ref 6–23)
CO2: 29 mEq/L (ref 19–32)
Calcium: 9.2 mg/dL (ref 8.4–10.5)
Chloride: 98 mEq/L (ref 96–112)
Creatinine, Ser: 0.75 mg/dL (ref 0.50–1.10)
GFR calc Af Amer: >60 mL/min (ref >60)
GFR calc non Af Amer: >60 mL/min (ref >60)
Glucose, Bld: 181 mg/dL — ABNORMAL HIGH (ref 70–99)
Potassium: 3.7 mEq/L (ref 3.5–5.1)
Sodium: 136 mEq/L (ref 135–145)
Total Bilirubin: 0.4 mg/dL (ref 0.3–1.2)
Total Protein: 8.1 g/dL (ref 6.0–8.3)

## 2010-12-31 LAB — TROPONIN I: Troponin I: <0.3 ng/mL (ref <0.30)

## 2010-12-31 LAB — URINALYSIS, ROUTINE W REFLEX MICROSCOPIC
Glucose, UA: NEGATIVE mg/dL
Hgb urine dipstick: NEGATIVE
Ketones, ur: 15 mg/dL — AB
Leukocytes, UA: NEGATIVE
Nitrite: NEGATIVE
Protein, ur: 100 mg/dL — AB
Specific Gravity, Urine: 1.021 (ref 1.005–1.030)
Urobilinogen, UA: 1 mg/dL (ref 0.0–1.0)
pH: 6.5 (ref 5.0–8.0)

## 2010-12-31 LAB — DIFFERENTIAL
Basophils Absolute: 0 10*3/uL (ref 0.0–0.1)
Basophils Relative: 0 % (ref 0–1)
Eosinophils Absolute: 0 10*3/uL (ref 0.0–0.7)
Eosinophils Relative: 0 % (ref 0–5)
Lymphocytes Relative: 13 % (ref 12–46)
Lymphs Abs: 1.1 10*3/uL (ref 0.7–4.0)
Monocytes Absolute: 0.6 10*3/uL (ref 0.1–1.0)
Monocytes Relative: 7 % (ref 3–12)
Neutro Abs: 7.2 10*3/uL (ref 1.7–7.7)
Neutrophils Relative %: 80 % — ABNORMAL HIGH (ref 43–77)

## 2010-12-31 LAB — CK TOTAL AND CKMB (NOT AT ARMC)
CK, MB: 1 ng/mL (ref 0.3–4.0)
Relative Index: INVALID (ref 0.0–2.5)
Total CK: 60 U/L (ref 7–177)

## 2010-12-31 LAB — CBC
HCT: 39.3 % (ref 36.0–46.0)
Hemoglobin: 12.6 g/dL (ref 12.0–15.0)
MCH: 22.5 pg — ABNORMAL LOW (ref 26.0–34.0)
MCHC: 32.1 g/dL (ref 30.0–36.0)
MCV: 70.2 fL — ABNORMAL LOW (ref 78.0–100.0)
Platelets: 270 10*3/uL (ref 150–400)
RBC: 5.6 MIL/uL — ABNORMAL HIGH (ref 3.87–5.11)
RDW: 17.1 % — ABNORMAL HIGH (ref 11.5–15.5)
WBC: 8.9 10*3/uL (ref 4.0–10.5)

## 2010-12-31 LAB — LIPASE, BLOOD: Lipase: 25 U/L (ref 11–59)

## 2010-12-31 LAB — URINE MICROSCOPIC-ADD ON

## 2011-01-02 ENCOUNTER — Inpatient Hospital Stay (HOSPITAL_COMMUNITY)
Admission: AD | Admit: 2011-01-02 | Discharge: 2011-01-09 | DRG: 439 | Disposition: A | Discharge status: Home / Self Care | Payer: Medicare HMO | Source: Ambulatory Visit | Attending: Internal Medicine | Admitting: Internal Medicine

## 2011-01-02 ENCOUNTER — Encounter: Payer: Self-pay | Admitting: Internal Medicine

## 2011-01-02 DIAGNOSIS — R1013 Epigastric pain: Secondary | ICD-10-CM | POA: Diagnosis present

## 2011-01-02 DIAGNOSIS — J9610 Chronic respiratory failure, unspecified whether with hypoxia or hypercapnia: Secondary | ICD-10-CM | POA: Diagnosis present

## 2011-01-02 DIAGNOSIS — G40909 Epilepsy, unspecified, not intractable, without status epilepticus: Secondary | ICD-10-CM | POA: Diagnosis present

## 2011-01-02 DIAGNOSIS — I10 Essential (primary) hypertension: Secondary | ICD-10-CM | POA: Diagnosis present

## 2011-01-02 DIAGNOSIS — G4733 Obstructive sleep apnea (adult) (pediatric): Secondary | ICD-10-CM | POA: Diagnosis present

## 2011-01-02 DIAGNOSIS — E876 Hypokalemia: Secondary | ICD-10-CM | POA: Diagnosis present

## 2011-01-02 DIAGNOSIS — I5032 Chronic diastolic (congestive) heart failure: Secondary | ICD-10-CM | POA: Diagnosis present

## 2011-01-02 DIAGNOSIS — D649 Anemia, unspecified: Secondary | ICD-10-CM | POA: Diagnosis present

## 2011-01-02 DIAGNOSIS — K859 Acute pancreatitis without necrosis or infection, unspecified: Principal | ICD-10-CM | POA: Diagnosis present

## 2011-01-02 DIAGNOSIS — Z9981 Dependence on supplemental oxygen: Secondary | ICD-10-CM

## 2011-01-02 DIAGNOSIS — I252 Old myocardial infarction: Secondary | ICD-10-CM

## 2011-01-02 DIAGNOSIS — Z6841 Body Mass Index (BMI) 40.0 and over, adult: Secondary | ICD-10-CM

## 2011-01-02 DIAGNOSIS — I509 Heart failure, unspecified: Secondary | ICD-10-CM | POA: Diagnosis present

## 2011-01-02 DIAGNOSIS — N179 Acute kidney failure, unspecified: Secondary | ICD-10-CM | POA: Diagnosis present

## 2011-01-02 DIAGNOSIS — E119 Type 2 diabetes mellitus without complications: Secondary | ICD-10-CM | POA: Diagnosis present

## 2011-01-02 LAB — GLUCOSE, CAPILLARY
Glucose-Capillary: 117 mg/dL — ABNORMAL HIGH (ref 70–99)
Glucose-Capillary: 117 mg/dL — ABNORMAL HIGH (ref 70–99)
Glucose-Capillary: 145 mg/dL — ABNORMAL HIGH (ref 70–99)

## 2011-01-02 LAB — DIFFERENTIAL
Basophils Absolute: 0 10*3/uL (ref 0.0–0.1)
Basophils Relative: 0 % (ref 0–1)
Eosinophils Absolute: 0.1 10*3/uL (ref 0.0–0.7)
Eosinophils Relative: 1 % (ref 0–5)
Lymphocytes Relative: 18 % (ref 12–46)
Lymphs Abs: 1.5 10*3/uL (ref 0.7–4.0)
Monocytes Absolute: 0.6 10*3/uL (ref 0.1–1.0)
Monocytes Relative: 8 % (ref 3–12)
Neutro Abs: 6 10*3/uL (ref 1.7–7.7)
Neutrophils Relative %: 74 % (ref 43–77)

## 2011-01-02 LAB — CBC
HCT: 35.3 % — ABNORMAL LOW (ref 36.0–46.0)
Hemoglobin: 10.8 g/dL — ABNORMAL LOW (ref 12.0–15.0)
MCH: 21.8 pg — ABNORMAL LOW (ref 26.0–34.0)
MCHC: 30.6 g/dL (ref 30.0–36.0)
MCV: 71.2 fL — ABNORMAL LOW (ref 78.0–100.0)
Platelets: 176 10*3/uL (ref 150–400)
RBC: 4.96 MIL/uL (ref 3.87–5.11)
RDW: 17.1 % — ABNORMAL HIGH (ref 11.5–15.5)
WBC: 8.1 10*3/uL (ref 4.0–10.5)

## 2011-01-02 LAB — COMPREHENSIVE METABOLIC PANEL
ALT: 15 U/L (ref 0–35)
AST: 16 U/L (ref 0–37)
Albumin: 3.1 g/dL — ABNORMAL LOW (ref 3.5–5.2)
Alkaline Phosphatase: 120 U/L — ABNORMAL HIGH (ref 39–117)
BUN: 6 mg/dL (ref 6–23)
CO2: 34 mEq/L — ABNORMAL HIGH (ref 19–32)
Calcium: 9.2 mg/dL (ref 8.4–10.5)
Chloride: 96 mEq/L (ref 96–112)
Creatinine, Ser: 0.68 mg/dL (ref 0.50–1.10)
GFR calc Af Amer: >60 mL/min (ref >60)
GFR calc non Af Amer: >60 mL/min (ref >60)
Glucose, Bld: 110 mg/dL — ABNORMAL HIGH (ref 70–99)
Potassium: 3.2 mEq/L — ABNORMAL LOW (ref 3.5–5.1)
Sodium: 136 mEq/L (ref 135–145)
Total Bilirubin: 0.2 mg/dL — ABNORMAL LOW (ref 0.3–1.2)
Total Protein: 7.3 g/dL (ref 6.0–8.3)

## 2011-01-02 LAB — LIPASE, BLOOD: Lipase: 20 U/L (ref 11–59)

## 2011-01-03 ENCOUNTER — Inpatient Hospital Stay (HOSPITAL_COMMUNITY): Payer: Medicare HMO

## 2011-01-03 ENCOUNTER — Encounter: Payer: Self-pay | Admitting: *Deleted

## 2011-01-03 LAB — DIFFERENTIAL
Basophils Absolute: 0 K/uL (ref 0.0–0.1)
Basophils Relative: 0 % (ref 0–1)
Eosinophils Absolute: 0.1 K/uL (ref 0.0–0.7)
Eosinophils Relative: 2 % (ref 0–5)
Lymphocytes Relative: 24 % (ref 12–46)
Lymphs Abs: 1.8 K/uL (ref 0.7–4.0)
Monocytes Absolute: 0.8 K/uL (ref 0.1–1.0)
Monocytes Relative: 10 % (ref 3–12)
Neutro Abs: 4.9 K/uL (ref 1.7–7.7)
Neutrophils Relative %: 64 % (ref 43–77)

## 2011-01-03 LAB — BASIC METABOLIC PANEL
BUN: 10 mg/dL (ref 6–23)
CO2: 28 mEq/L (ref 19–32)
Calcium: 9 mg/dL (ref 8.4–10.5)
Chloride: 96 mEq/L (ref 96–112)
Creatinine, Ser: 1.59 mg/dL — ABNORMAL HIGH (ref 0.50–1.10)
GFR calc Af Amer: 40 mL/min — ABNORMAL LOW (ref >60)
GFR calc non Af Amer: 33 mL/min — ABNORMAL LOW (ref >60)
Glucose, Bld: 183 mg/dL — ABNORMAL HIGH (ref 70–99)
Potassium: 4.5 mEq/L (ref 3.5–5.1)
Sodium: 131 mEq/L — ABNORMAL LOW (ref 135–145)

## 2011-01-03 LAB — GLUCOSE, CAPILLARY
Glucose-Capillary: 125 mg/dL — ABNORMAL HIGH (ref 70–99)
Glucose-Capillary: 100 mg/dL — ABNORMAL HIGH (ref 70–99)
Glucose-Capillary: 171 mg/dL — ABNORMAL HIGH (ref 70–99)
Glucose-Capillary: 71 mg/dL (ref 70–99)
Glucose-Capillary: 125 mg/dL — ABNORMAL HIGH (ref 70–99)

## 2011-01-03 LAB — HEMOGLOBIN A1C
Hgb A1c MFr Bld: 10.6 % — ABNORMAL HIGH
Mean Plasma Glucose: 258 mg/dL — ABNORMAL HIGH

## 2011-01-03 LAB — BASIC METABOLIC PANEL WITH GFR
BUN: 8 mg/dL (ref 6–23)
CO2: 32 meq/L (ref 19–32)
Calcium: 9.2 mg/dL (ref 8.4–10.5)
Chloride: 98 meq/L (ref 96–112)
Creatinine, Ser: 1.58 mg/dL — ABNORMAL HIGH (ref 0.50–1.10)
GFR calc Af Amer: 40 mL/min — ABNORMAL LOW
GFR calc non Af Amer: 33 mL/min — ABNORMAL LOW
Glucose, Bld: 121 mg/dL — ABNORMAL HIGH (ref 70–99)
Potassium: 4.5 meq/L (ref 3.5–5.1)
Sodium: 136 meq/L (ref 135–145)

## 2011-01-03 LAB — LIPID PANEL
Cholesterol: 179 mg/dL (ref 0–200)
HDL: 53 mg/dL (ref >39)
LDL Cholesterol: 106 mg/dL — ABNORMAL HIGH (ref 0–99)
Total CHOL/HDL Ratio: 3.4 RATIO
Triglycerides: 101 mg/dL (ref <150)
VLDL: 20 mg/dL (ref 0–40)

## 2011-01-03 LAB — CBC
HCT: 35.6 % — ABNORMAL LOW (ref 36.0–46.0)
Hemoglobin: 10.9 g/dL — ABNORMAL LOW (ref 12.0–15.0)
MCH: 22.3 pg — ABNORMAL LOW (ref 26.0–34.0)
MCHC: 30.6 g/dL (ref 30.0–36.0)
MCV: 72.8 fL — ABNORMAL LOW (ref 78.0–100.0)
Platelets: 283 K/uL (ref 150–400)
RBC: 4.89 MIL/uL (ref 3.87–5.11)
RDW: 17.5 % — ABNORMAL HIGH (ref 11.5–15.5)
WBC: 7.6 K/uL (ref 4.0–10.5)

## 2011-01-04 ENCOUNTER — Inpatient Hospital Stay (HOSPITAL_COMMUNITY): Payer: Medicare HMO

## 2011-01-04 LAB — GLUCOSE, CAPILLARY
Glucose-Capillary: 117 mg/dL — ABNORMAL HIGH (ref 70–99)
Glucose-Capillary: 123 mg/dL — ABNORMAL HIGH (ref 70–99)
Glucose-Capillary: 126 mg/dL — ABNORMAL HIGH (ref 70–99)
Glucose-Capillary: 127 mg/dL — ABNORMAL HIGH (ref 70–99)
Glucose-Capillary: 86 mg/dL (ref 70–99)
Glucose-Capillary: 90 mg/dL (ref 70–99)

## 2011-01-04 LAB — CBC
HCT: 31.7 % — ABNORMAL LOW (ref 36.0–46.0)
Hemoglobin: 9.4 g/dL — ABNORMAL LOW (ref 12.0–15.0)
MCH: 21.8 pg — ABNORMAL LOW (ref 26.0–34.0)
MCHC: 29.7 g/dL — ABNORMAL LOW (ref 30.0–36.0)
MCV: 73.5 fL — ABNORMAL LOW (ref 78.0–100.0)
Platelets: 285 10*3/uL (ref 150–400)
RBC: 4.31 MIL/uL (ref 3.87–5.11)
RDW: 17.5 % — ABNORMAL HIGH (ref 11.5–15.5)
WBC: 7.2 10*3/uL (ref 4.0–10.5)

## 2011-01-04 LAB — BASIC METABOLIC PANEL
BUN: 12 mg/dL (ref 6–23)
CO2: 29 mEq/L (ref 19–32)
Calcium: 8.7 mg/dL (ref 8.4–10.5)
Chloride: 99 mEq/L (ref 96–112)
Creatinine, Ser: 1.73 mg/dL — ABNORMAL HIGH (ref 0.50–1.10)
GFR calc Af Amer: 36 mL/min — ABNORMAL LOW (ref >60)
GFR calc non Af Amer: 30 mL/min — ABNORMAL LOW (ref >60)
Glucose, Bld: 81 mg/dL (ref 70–99)
Potassium: 4.7 mEq/L (ref 3.5–5.1)
Sodium: 134 mEq/L — ABNORMAL LOW (ref 135–145)

## 2011-01-04 NOTE — Progress Notes (Signed)
Quick Note:  Spoke with patients sister-pt is in the hospital for her pancreas flare up-CY is aware of this and states this is most likely why her d dimer could be increased. ______ 

## 2011-01-04 NOTE — Progress Notes (Signed)
Quick Note:  Spoke with patients sister-pt is in the hospital for her pancreas flare up-CY is aware of this and states this is most likely why her d dimer could be increased. ______

## 2011-01-05 LAB — BASIC METABOLIC PANEL
BUN: 6 mg/dL (ref 6–23)
CO2: 28 mEq/L (ref 19–32)
Calcium: 9.3 mg/dL (ref 8.4–10.5)
Chloride: 100 mEq/L (ref 96–112)
Creatinine, Ser: 0.87 mg/dL (ref 0.50–1.10)
GFR calc Af Amer: >60 mL/min (ref >60)
GFR calc non Af Amer: >60 mL/min (ref >60)
Glucose, Bld: 77 mg/dL (ref 70–99)
Potassium: 4.1 mEq/L (ref 3.5–5.1)
Sodium: 135 mEq/L (ref 135–145)

## 2011-01-05 LAB — POCT OCCULT BLOOD STOOL (DEVICE): Fecal Occult Bld: NEGATIVE

## 2011-01-05 LAB — CBC
HCT: 31.9 % — ABNORMAL LOW (ref 36.0–46.0)
Hemoglobin: 9.7 g/dL — ABNORMAL LOW (ref 12.0–15.0)
MCH: 21.9 pg — ABNORMAL LOW (ref 26.0–34.0)
MCHC: 30.4 g/dL (ref 30.0–36.0)
MCV: 72.2 fL — ABNORMAL LOW (ref 78.0–100.0)
Platelets: 255 10*3/uL (ref 150–400)
RBC: 4.42 MIL/uL (ref 3.87–5.11)
RDW: 17 % — ABNORMAL HIGH (ref 11.5–15.5)
WBC: 6 10*3/uL (ref 4.0–10.5)

## 2011-01-05 LAB — GLUCOSE, CAPILLARY
Glucose-Capillary: 101 mg/dL — ABNORMAL HIGH (ref 70–99)
Glucose-Capillary: 112 mg/dL — ABNORMAL HIGH (ref 70–99)
Glucose-Capillary: 114 mg/dL — ABNORMAL HIGH (ref 70–99)
Glucose-Capillary: 153 mg/dL — ABNORMAL HIGH (ref 70–99)
Glucose-Capillary: 71 mg/dL (ref 70–99)
Glucose-Capillary: 84 mg/dL (ref 70–99)
Glucose-Capillary: 89 mg/dL (ref 70–99)

## 2011-01-05 LAB — CREATININE, URINE, RANDOM: Creatinine, Urine: 102 mg/dL

## 2011-01-05 LAB — SODIUM, URINE, RANDOM: Sodium, Ur: <15 mEq/L

## 2011-01-05 NOTE — Consult Note (Signed)
  [name], [name] NO.:  [id number]  MEDICAL RECORD NO.:  [id number]  LOCATION:  1307                         FACILITY:  [facility]  PHYSICIAN:  [name]. [name], M.D.  DATE OF BIRTH:  03-Apr-1946  DATE OF CONSULTATION: DATE OF DISCHARGE:                                CONSULTATION   Nephrology Consultation  HISTORY OF PRESENT ILLNESS:  I was asked by Dr. Waymon [name] to see this 65- year-old female with acute kidney injury.  Patient was admitted on January 02, 2011 with complaint of abdominal pain.  She is diagnosed with having pancreatitis.  Of note, the patient had gallstone pancreatitis back in March 2012 and underwent laparoscopic cholecystectomy at that time.  She presents today with several day history of abdominal pain.  No diarrhea or vomiting, was found to have a normal lipase.  CT scan several days earlier did show findings consistent with pancreatitis.  On December 31, 2010, serum creatinine was 0.75 mg/dL, on January 02, 2011, 1.61 mg/dL, on January 03, 2011, it went up to 1.58 and today creatinine is up to 1.73 mg/dL.  The patient has not received any contrast or NSAIDs.  Blood pressure on January 02, 2011 was 131/80, dropping to 94/58 on that same date and has been low since that time in that range.  PAST MEDICAL HISTORY:  Remarkable for: 1. Biliary pancreatitis, status post cholecystectomy. 2. Status post myocardial infarction. 3. History of diabetes mellitus. 4. Hypertension. 5. Obstructive sleep apnea. 6. Seizure disorder. 7. Dyslipidemia. 8. Back pain. 9. TIA. 10.Status post tubal ligation. 11.Status post tonsillectomy. 12.Status post appendectomy.  ALLERGIES: 1. PENICILLIN. 2. SULFA. 3. CODEINE.  CURRENT MEDICATIONS:  Include: 1. Abilify. 2. Aspirin. 3. Lovenox. 4. Neurontin. 5. Insulin. 6. Isosorbide. 7. Multivitamins. 8. Bystolic. 9. Protonix. 10.Zoloft. 11.Albuterol. 12.Lamictal.  PHYSICAL EXAMINATION:  VITAL SIGNS:  Blood pressure is  95/61.  She is afebrile.  Input/output 1725 in, 475 out. GENERAL:  Pleasant, obese female, alert and oriented x3. HEENT:  Atraumatic, normocephalic.  Extraocular movements are intact. LUNGS:  Clear. HEART:  Regular rhythm and rate. ABDOMEN:  Obese. NEUROLOGIC:  There is no evidence of neurologic focality.  LABORATORY DATA:  Sodium 134, potassium 4.7, chloride 99, CO2 of 29, BUN 12, creatinine 1.73.  Hemoglobin 9.4, platelets 285,000.  ASSESSMENT: 1. Acute kidney injury, most likely hemodynamically mediated secondary     to low blood pressure. 2. Hypertension and currently hypotensive. 3. Anemia.  RECOMMENDATIONS: 1. Optimize blood pressure (specifically allow blood pressure to drift     upward - hold Bystolic and Imdur). 2. Symptomatic treatment. 3. We will follow closely with you.          ______________________________ [name]. [name], M.D.     ACP/MEDQ  D:  01/04/2011  T:  01/04/2011  Job:  [id number]  Electronically Signed by [name] Needle M.D. on 01/05/2011 06:27:26 PM

## 2011-01-06 LAB — GLUCOSE, CAPILLARY
Glucose-Capillary: 79 mg/dL (ref 70–99)
Glucose-Capillary: 117 mg/dL — ABNORMAL HIGH (ref 70–99)
Glucose-Capillary: 141 mg/dL — ABNORMAL HIGH (ref 70–99)
Glucose-Capillary: 176 mg/dL — ABNORMAL HIGH (ref 70–99)
Glucose-Capillary: 79 mg/dL (ref 70–99)

## 2011-01-06 LAB — BASIC METABOLIC PANEL
BUN: 4 mg/dL — ABNORMAL LOW (ref 6–23)
CO2: 30 mEq/L (ref 19–32)
Calcium: 9.4 mg/dL (ref 8.4–10.5)
Chloride: 103 mEq/L (ref 96–112)
Creatinine, Ser: 0.72 mg/dL (ref 0.50–1.10)
GFR calc Af Amer: >60 mL/min (ref >60)
GFR calc non Af Amer: >60 mL/min (ref >60)
Glucose, Bld: 115 mg/dL — ABNORMAL HIGH (ref 70–99)
Potassium: 3.9 mEq/L (ref 3.5–5.1)
Sodium: 139 mEq/L (ref 135–145)

## 2011-01-06 LAB — CBC
HCT: 33.1 % — ABNORMAL LOW (ref 36.0–46.0)
Hemoglobin: 10.1 g/dL — ABNORMAL LOW (ref 12.0–15.0)
MCH: 21.7 pg — ABNORMAL LOW (ref 26.0–34.0)
MCHC: 30.5 g/dL (ref 30.0–36.0)
MCV: 71 fL — ABNORMAL LOW (ref 78.0–100.0)
Platelets: 284 10*3/uL (ref 150–400)
RBC: 4.66 MIL/uL (ref 3.87–5.11)
RDW: 16.9 % — ABNORMAL HIGH (ref 11.5–15.5)
WBC: 5.5 10*3/uL (ref 4.0–10.5)

## 2011-01-07 LAB — GLUCOSE, CAPILLARY
Glucose-Capillary: 73 mg/dL (ref 70–99)
Glucose-Capillary: 201 mg/dL — ABNORMAL HIGH (ref 70–99)
Glucose-Capillary: 65 mg/dL — ABNORMAL LOW (ref 70–99)
Glucose-Capillary: 106 mg/dL — ABNORMAL HIGH (ref 70–99)
Glucose-Capillary: 251 mg/dL — ABNORMAL HIGH (ref 70–99)

## 2011-01-08 LAB — COMPREHENSIVE METABOLIC PANEL
ALT: 10 U/L (ref 0–35)
AST: 12 U/L (ref 0–37)
Albumin: 2.8 g/dL — ABNORMAL LOW (ref 3.5–5.2)
Alkaline Phosphatase: 102 U/L (ref 39–117)
BUN: 4 mg/dL — ABNORMAL LOW (ref 6–23)
CO2: 30 mEq/L (ref 19–32)
Calcium: 9.3 mg/dL (ref 8.4–10.5)
Chloride: 100 mEq/L (ref 96–112)
Creatinine, Ser: 0.79 mg/dL (ref 0.50–1.10)
GFR calc Af Amer: >60 mL/min (ref >60)
GFR calc non Af Amer: >60 mL/min (ref >60)
Glucose, Bld: 70 mg/dL (ref 70–99)
Potassium: 3.7 mEq/L (ref 3.5–5.1)
Sodium: 139 mEq/L (ref 135–145)
Total Bilirubin: 0.2 mg/dL — ABNORMAL LOW (ref 0.3–1.2)
Total Protein: 7.1 g/dL (ref 6.0–8.3)

## 2011-01-08 LAB — GLUCOSE, CAPILLARY
Glucose-Capillary: 98 mg/dL (ref 70–99)
Glucose-Capillary: 232 mg/dL — ABNORMAL HIGH (ref 70–99)
Glucose-Capillary: 103 mg/dL — ABNORMAL HIGH (ref 70–99)
Glucose-Capillary: 268 mg/dL — ABNORMAL HIGH (ref 70–99)

## 2011-01-08 LAB — CBC
HCT: 33 % — ABNORMAL LOW (ref 36.0–46.0)
Hemoglobin: 10.2 g/dL — ABNORMAL LOW (ref 12.0–15.0)
MCH: 21.9 pg — ABNORMAL LOW (ref 26.0–34.0)
MCHC: 30.9 g/dL (ref 30.0–36.0)
MCV: 70.8 fL — ABNORMAL LOW (ref 78.0–100.0)
Platelets: 312 10*3/uL (ref 150–400)
RBC: 4.66 MIL/uL (ref 3.87–5.11)
RDW: 16.8 % — ABNORMAL HIGH (ref 11.5–15.5)
WBC: 4.8 10*3/uL (ref 4.0–10.5)

## 2011-01-09 LAB — BASIC METABOLIC PANEL
BUN: 5 mg/dL — ABNORMAL LOW (ref 6–23)
CO2: 31 mEq/L (ref 19–32)
Calcium: 9.6 mg/dL (ref 8.4–10.5)
Chloride: 101 mEq/L (ref 96–112)
Creatinine, Ser: 0.8 mg/dL (ref 0.50–1.10)
GFR calc Af Amer: >60 mL/min (ref >60)
GFR calc non Af Amer: >60 mL/min (ref >60)
Glucose, Bld: 75 mg/dL (ref 70–99)
Potassium: 3.6 mEq/L (ref 3.5–5.1)
Sodium: 138 mEq/L (ref 135–145)

## 2011-01-09 LAB — CBC
HCT: 31.3 % — ABNORMAL LOW (ref 36.0–46.0)
Hemoglobin: 9.7 g/dL — ABNORMAL LOW (ref 12.0–15.0)
MCH: 21.9 pg — ABNORMAL LOW (ref 26.0–34.0)
MCHC: 31 g/dL (ref 30.0–36.0)
MCV: 70.8 fL — ABNORMAL LOW (ref 78.0–100.0)
Platelets: 326 10*3/uL (ref 150–400)
RBC: 4.42 MIL/uL (ref 3.87–5.11)
RDW: 16.6 % — ABNORMAL HIGH (ref 11.5–15.5)
WBC: 4.7 10*3/uL (ref 4.0–10.5)

## 2011-01-09 LAB — GLUCOSE, CAPILLARY
Glucose-Capillary: 131 mg/dL — ABNORMAL HIGH (ref 70–99)
Glucose-Capillary: 168 mg/dL — ABNORMAL HIGH (ref 70–99)

## 2011-01-09 LAB — CLOSTRIDIUM DIFFICILE BY PCR: Toxigenic C. Difficile by PCR: NEGATIVE

## 2011-01-15 NOTE — H&P (Signed)
[name], [name] NO.:  [id number]  MEDICAL RECORD NO.:  [id number]  LOCATION:  1307                         FACILITY:  [facility]  PHYSICIAN:  Marcellus Scott, MD     DATE OF BIRTH:  01/17/1946  DATE OF ADMISSION:  01/02/2011 DATE OF DISCHARGE:                             HISTORY & PHYSICAL   PRIMARY CARE PHYSICIAN:  The patient does not remember.  GASTROENTEROLOGIST:  Willis Modena, MD.  PULMONOLOGIST:  Rennis Chris. Maple Hudson, MD, FCCP, FACP.  PRIMARY CARDIOLOGIST:  Not clear at this time.  CHIEF COMPLAINT:  Abdominal pain.  HISTORY OF PRESENTING ILLNESS:  [name] is a pleasant 65 year old African American female patient with history of biliary pancreatitis in March 2012, status post laparoscopic cholecystectomy, non-ST-elevation MI with no significant obstruction by cardiac catheterization, oxygen- dependent chronic respiratory failure, sleep apnea who is not on CPAP, obesity, chronic diastolic congestive heart failure, type 2 diabetes mellitus on insulin, hypertension, seizure disorder who was in her usual state of health until 4 days ago.  She was awakened at night by 8/10 sharp epigastric region abdominal pain.  This was not radiating.  It was associated with nausea, but she did not have any vomiting.  No associated fever or chills.  She says she decided to bear the pain.  She also volunteers that this pain was similar to the pain that she had when she had an episode of pancreatitis.  However, subsequently that day, her sister convinced her to go to the emergency room.  In the emergency room, her blood tests were apparently normal.  She was discharged home on some pain medications and advised to be on a liquid diet.  Despite these measures, the patient continued to have worsening of her abdominal pain.  She ate some 3 days back but less since then.  She had an episode of loose stools.  Her stools have been normally black in color because of the iron  supplements.  She went to see her primary gastroenterologist who determined that this patient had recurrent or persistent pancreatitis and indicated that inpatient management was most appropriate.  She was sent to the medical floor as a direct admit and the triad hospitalists were requested to admit her for further evaluation and management.  The patient indicates that she has not started any new medications.  PAST MEDICAL HISTORY: 1. Recent episode of biliary pancreatitis. 2. Non-ST-elevation MI. 3. Insulin-dependent type 2 diabetes mellitus. 4. Hypertension. 5. Sleep apnea. 6. Chronic respiratory failure on 2 L of oxygen via nasal cannula. 7. Seizure disorder with last seizure approximately 6 months ago. 8. Hyperlipidemia. 9. Chronic diastolic congestive heart failure. 10.Chronic back pain. 11.TIA with no residual deficits.  PAST SURGICAL HISTORY: 1. Laparoscopic cholecystectomy. 2. Hysterectomy. 3. Tubal ligation. 4. Tonsillectomy. 5. Appendectomy. 6. Cyst removal in the perineal/rectal region. 7. Moles removal. 8. Back surgery in the 1980s.  ALLERGIES: 1. PENICILLINS cause hives. 2. SULFA causes hives. 3. CODEINE causes hives.  HOME MEDICATIONS: 1. Lamictal 100 mg p.o. b.i.d. 2. Polyethylene glycol 3350, 17 g p.o. daily p.r.n. constipation. 3. Isosorbide mononitrate XR 30 mg p.o. daily. 4. Iron 65 mg p.o. daily. 5. Furosemide 40 mg p.o.  b.i.d. 6. Fish oil 300 mg p.o. daily. 7. Bystolic 10 mg p.o. daily. 8. Protonix 40 mg p.o. daily. 9. Klor-Con 20 mEq p.o. daily. 10.Levemir 45 units subcutaneously q.h.s. 11.Multivitamins 1 tablet p.o. daily. 12.Abilify 5 mg p.o. daily. 13.Enteric-coated aspirin 81 mg p.o. daily. 14.Zoloft 100 mg p.o. daily. 15.Gabapentin 300 mg p.o. t.i.d. 16.Percocet 5/325, 1-2 tablets p.o. q.6 hourly p.r.n. pain.  FAMILY HISTORY:  Negative.  SOCIAL HISTORY:  The patient indicates that she quit smoking and alcohol years ago.  She is  divorced.  She lives with her sister and is on disability.  She used to work as a Conservator, museum/gallery.  She at times ambulates with the help of a cane.  Otherwise, she is independent of activities of daily living.  ADVANCED DIRECTIVES:  Full code.  REVIEW OF SYSTEMS:  All systems reviewed and apart from history of presenting illness is pertinent for chronic mild dyspnea, which is controlled on her home oxygen.  Occasional very brief fleeting chest pains, but none recently.  Mild headache.  PHYSICAL EXAMINATION:  GENERAL:  [name] is a moderately-built and obese female patient who is in no obvious distress. VITAL SIGNS:  Temperature is 99 degrees Fahrenheit, pulse 75 per minute regular, respirations 18 per minute, blood pressure 131/80 mmHg, and saturating at 91% on room air. HEENT: Nontraumatic, normocephalic.  Pupils equally reacting to light and accommodation.  Oral mucosa is mildly dry, but no other acute findings. NECK:  Supple.  No JVD or carotid bruit. LYMPHATICS:  No lymphadenopathy. RESPIRATORY SYSTEM:  Clear to auscultation.  No increased work of breathing. CARDIOVASCULAR SYSTEM:  First and second heart sounds heard regular.  No JVD or murmur. ABDOMEN:  Nondistended.  The patient is tender mostly in the epigastric right upper quadrant and periumbilical region.  Abdomen; however, is soft.  There is no rigidity, guarding, or rebound.  Bowel sounds are normally heard.  No organomegaly or mass appreciated. CENTRAL NERVOUS SYSTEM: The patient is awake, alert, oriented x3 with no focal neurological deficits. EXTREMITIES:  With grade 5/5 power.  Trace ankle edema. MUSCULOSKELETAL SYSTEM:  Negative. SKIN:  Without any rashes.  LABORATORY DATA:  Her CBC, CMET, lipase have been requested and pending.  ASSESSMENT AND PLAN: 1. Abdominal pain suspicious for recurrent or persistent pancreatitis     who has failed outpatient management.  The patient is admitted to      regular medical bed.  We will follow up on her lipase and     comprehensive metabolic panel results.  The patient is to be on     sips of clear liquids only.  We will also provide pain management.     [facility], Dr. Willis Modena will consult and follow     the patient in the hospital.  He recommends no imaging studies or     antibiotic therapy at this time which is quite appropriate. 2. Mild dehydration.  We will temporarily hold her Lasix and gently     hydrate with IV fluids. 3. Type 2 diabetes mellitus.  We will reduce the dose of Lantus and     continue sliding scale. 4. Hypertension.  Continue her beta blockers. 5. History of seizure disorder.  No seizures recently.  Continue her     home antiseizure medications. 6. Chronic respiratory failure, possibly secondary to obstructive     sleep apnea, chronic diastolic heart failure.  Continue oxygen.     Her heart failure seems to be clinically compensated and the  patient actually appears mildly dehydrated. 7. Full code status.  Time taken in coordinating this history and physical note is 60 minutes.     Marcellus Scott, MD     AH/MEDQ  D:  01/02/2011  T:  01/02/2011  Job:  [id number]  cc:   Willis Modena, MD Fax: [phone]  Clinton D. Maple Hudson, MD, FCCP, FACP [facility] [address] [place] [address]  Electronically Signed by Marcellus Scott MD on 01/15/2011 11:06:42 AM

## 2011-01-15 NOTE — Progress Notes (Signed)
[name], [name] NO.:  [id number]  MEDICAL RECORD NO.:  [id number]  LOCATION:  1307                         FACILITY:  [facility]  PHYSICIAN:  Marcellus Scott, MD     DATE OF BIRTH:  [birth year]                                PROGRESS NOTE   DATE OF DISCHARGE: To be determined.  PRIMARY CARE PHYSICIAN: Bebe Liter, MD  GASTROENTEROLOGIST: Dr. Willis Modena.  PULMONOLOGIST: Dr. Jetty Duhamel.  CURRENT DIAGNOSES: 1. Recurrent or persistent pancreatitis. 2. Dehydration, resolved. 3. Type 2 diabetes mellitus. 4. Hypertension. 5. Acute renal failure, resolved. 6. History of seizure disorder. 7. Chronic respiratory failure, on home oxygen. 8. Obstructive sleep apnea. 9. Chronic diastolic congestive heart failure. 10.Hypokalemia, repleted. 11.Chronic anemia, stable. 12.Morbid obesity. 13.Recent non-ST elevation myocardial infarction.  DISCHARGE MEDICATIONS: To be dictated by discharging MD  IMAGING STUDIES: 1. Renal ultrasound on January 04, 2011.  Impression:  No evidence of     hydroureter or hydronephrosis.  No renal lesion demonstrated by     ultrasound.  No bladder lesion demonstrated by ultrasound. 2. Chest x-ray on July 11. 2012.  Impression:  Increase prominence of     right basilar atelectasis.  LABORATORY DATA: Basic metabolic panel yesterday only significant for glucose of 115. CBC; hemoglobin 10.1, hematocrit 33, white blood cell 5.5, platelets 284.  Stool for occult blood was negative x1.  Hemoglobin A1c 10.6. Lipid panel significant for LDL of 106, lipase on admission was 20. Hepatic panel on admission showed alkaline phosphatase 120, total bilirubin 0.2 and albumin of 3.1.  CONSULTATIONS: [facility], Dr. Randa Evens and Dr. Leary Roca.  Nephrology, Mindi Slicker. Lowell Guitar, M.D.  TODAY'S COMPLAINTS: The patient indicates that she has no abdominal pain since this morning. She is tolerating clear liquid diet.  Her diarrhea seems  to be improving. She denies dyspnea or chest pain.  PHYSICAL EXAMINATION: GENERAL:  On physical examination, [name] is in no obvious distress. VITAL SIGNS:  Temperature 98.3 degrees Fahrenheit, pulse 70 per minute and regular, respiration 18 per minute, blood pressure 126/71 mmHg and saturating at 99% on 2 L of oxygen.  CBGs range from 79 to 141 mg/dL. RESPIRATORY SYSTEM:  With occasional basal crackles, otherwise, clear. CARDIOVASCULAR SYSTEM:  First and second heart sounds heard.  Regular. No JVD. ABDOMEN:  Obese, soft, nontender with normal bowel sounds heard. CENTRAL NERVOUS SYSTEM:  The patient is awake, alert, oriented x3 with no focal neurological deficits.  HOSPITAL COURSE: [name] is a very pleasant 65 year old African-American female patient with recent history of biliary pancreatitis, status post laparoscopic cholecystectomy, recent non-ST elevation MI with no significant obstruction by cardiac catheterization, oxygen-dependent chronic respiratory failure, sleep apnea who is not on CPAP, morbid obesity, chronic diastolic congestive heart failure, insulin dependent type 2 diabetes mellitus, hypertension and seizure disorder, who presented with unrelenting abdominal pain.  She was seen in the emergency department and provided pain medication and her diet was downgraded to clear liquids.  The pain actually got worse.  She saw her primary gastroenterologist in his office and was sent to the hospital as a direct admission for further management of her recurrent or persistent pancreatitis. 1. Recurrent or persistent  pancreatitis.  The patient was admitted to     the medical floor.  No imaging studies were done.  Her lipase on     admission was normal.  Her bowels were rested with sips of oral     clear liquids only for couple of days. Once her abdominal pain     improved, she has been started on clear liquids 48 hours ago.  She     has tolerated the clear liquids and  her abdominal pain continues to     progressively decrease.  She had some diarrhea and nausea but both     of these are improving.  We will await the GI MDs followup     regarding advancing diet.  She has been started on pancreatic     enzymes.  If she continues to progress well, she should be able to     discharge in the next 48 to 72 hours. 2. Acute renal failure.  This was probably secondary to mild     hemodynamic compromise from soft blood pressures.  Nephrology was     consulted.  They held her Imdur and Bysolic.  Her blood pressures     were better and her renal failure has resolved.  She has been off     IV fluids.  Her Lasix has also been temporarily held. 3. Normocytic anemia.  Possibly chronic and stable. 4. Chronic diastolic congestive heart failure.  The patient has no     worsening dyspnea.  She is clinically compensated.  At some point,     her Lasix will have to be initiated. 5. Type 2 diabetes mellitus.  This is insulin dependent.  She is     poorly controlled as an outpatient but in the hospital her control     has been reasonably good. 6. Chronic respiratory failure.  This is probably multifactorial     secondary to sleep apnea, chronic diastolic congestive heart     failure, atelectasis.  This has remained stable.  DISPOSITION: The patient is not yet stable for discharge.     Marcellus Scott, MD     AH/MEDQ  D:  01/07/2011  T:  01/07/2011  Job:  [id number]  Electronically Signed by Marcellus Scott MD on 01/15/2011 11:09:50 AM

## 2011-01-18 NOTE — Discharge Summary (Signed)
NAMENAMRATA, DANGLER NO.:  [id number]  MEDICAL RECORD NO.:  [id number]  LOCATION:  1307                         FACILITY:  [facility]  PHYSICIAN:  Erick Blinks, MD     DATE OF BIRTH:  [birth year]  DATE OF ADMISSION:  01/02/2011 DATE OF DISCHARGE:                              DISCHARGE SUMMARY   PRIMARY CARE PHYSICIAN:  Bebe Liter, FNP  GASTROENTEROLOGIST:  Willis Modena, MD.  DISCHARGE DIAGNOSES: 1. Recurrent pancreatitis, improved. 2. Dehydration, resolved. 3. Insulin-dependent diabetes, type 2. 4. Hypertension. 5. Acute renal failure, resolved. 6. History of seizure disorder. 7. Chronic respiratory failure, on home oxygen. 8. Obstructive sleep apnea. 9. Chronic diastolic congestive heart failure. 10.Hypokalemia, repleted. 11.Chronic anemia, stable. 12.Morbid obesity. 13.Recent non-ST elevation myocardial infarction.  DISCHARGE MEDICATIONS: 1. Zofran 4 mg p.o. q.6 h p.r.n. 2. Creon - 12, two capsules p.o. t.i.d. with meals. 3. Percocet 5/325 mg, 1 to 2 tablets p.o. q.6 h p.r.n. 4. Gabapentin 300 mg p.o. t.i.d. 5. Zoloft 50 mg 2 tablets p.o. q.a.m. 6. Enteric-coated aspirin 81 mg p.o. daily. 7. Abilify 5 mg 1 tablet p.o. daily. 8. Furosemide 40 mg p.o. b.i.d. 9. Bystolic 10 mg 1 tablet p.o. daily. 10.Klor-Con 20 mEq 1 tablet p.o. daily. 11.Protonix 40 mg 1 tablet p.o. daily. 12.Iron 65 mg, 1 tablet p.o. daily. 13.Multivitamins 1 tablet p.o. daily. 14.Polyethylene glycol 17 g p.o. daily p.r.n. 15.Fish oil 300 mg p.o. daily. 16.Levemir 15 units subcutaneous q.h.s. 17.Isosorbide mononitrate XR 30 mg 1 tablet p.o. daily. 18.Lamictal 100 mg p.o. b.i.d.  IMAGING STUDIES: 1. Renal ultrasound on January 04, 2011 showed no evidence of hydroureter     or hydronephrosis or renal lesion demonstrated by ultrasound.  No     bladder lesion demonstrated by ultrasound. 2. Chest x-ray on January 03, 2011 shows increased prominence of right     basilar  atelectasis.  CONSULTATION: 1. [facility]. 2. Nephrology, Mindi Slicker. Lowell Guitar, M.D.  ADMISSION HISTORY:  This is a 65 year old African-American female with history of pancreatitis, who recently had a cholecystectomy in March, was brought to the emergency room with epigastric pain.  The patient has significant epigastric pain.  She was evaluated initially in the emergency room and was found to have normal blood work and was subsequently discharged home.  She was advised to be on liquid diet. Despite these measures, patient had worsening pain.  She went to see her primary gastroenterologist who felt that patient would benefit from inpatient management of possible acute/recurrent pancreatitis.  The patient was subsequently directly admitted to the floor.  For details, please refer to the history and physical per Dr. Waymon Amato on January 02, 2011.  HOSPITAL COURSE: 1. Pancreatitis, patient was followed by the gastroenterology service.     She was placed on bowel rest.  Her diet was slowly advanced.  She     is currently tolerating p.o. solid diet.  She has also been started     on pancreatic enzyme supplementation, will be continued on the     same.  She was having some loose stools which was thought to be     secondary to possible malabsorption from her pancreatitis.  Her C  diff has tested negative.  The patient is tolerating a low-fat diet     at this time.  She does not have any abdominal pain.  She will need     to follow up with Dr. Dulce Sellar in the next 2 weeks. 2. Acute renal failure.  This likely secondary to hemodynamic     compromise.  She was seen in consultation by Dr. Lowell Guitar from     Nephrology and initially her antihypertensives were held.  Her     Lasix was also held.  Her renal failure has since resolved.  She is     back on her Bystolic and Imdur, and it is felt appropriate to     restart her Lasix as an outpatient. 3. Chronic diastolic congestive heart failure.   This appears     compensated at this time.  We will restart her Lasix as she appears     to be euvolemic. 4. Type 2 diabetes.  The patient was on a larger dose of insulin at     home.  She has been monitored here on much lower dose of 10 units     of Lantus.  Her blood sugars have remained under reasonable range,     although we will expect an increase as her diet continues to     improve.  We will discharge her on 15 units of Lantus and she has     been advised to check her sugars closely.  She will follow up with     her primary care physician and her insulin will likely need further     adjustment. 5. Chronic respiratory failure.  The patient is on home O2.  This is     likely multifactorial secondary to her sleep apnea, chronic     diastolic congestive heart failure, and atelectasis.  She can     follow with Dr. Maple Hudson for further management.  We will continue her     on home oxygen.  DISCHARGE INSTRUCTIONS:  The patient should follow up with her primary care physician in next 1 to 2 weeks.  She will also follow up with Dr. Dulce Sellar in the next 2 weeks.  She should continue on low-fat, low-salt, low-calorie diet.  Conduct her activities tolerated.  Plan was discussed with the patient as well as her sister who are in agreement.     Erick Blinks, MD     JM/MEDQ  D:  01/09/2011  T:  01/09/2011  Job:  [id number]  cc:   Bebe Liter, FNP Fax: [phone]  Willis Modena, MD Fax: [phone]  Electronically Signed by Erick Blinks  on 01/18/2011 12:49:59 AM

## 2011-02-23 ENCOUNTER — Ambulatory Visit
Admission: RE | Admit: 2011-02-23 | Discharge: 2011-02-23 | Disposition: A | Discharge status: Home / Self Care | Payer: Medicare HMO | Source: Ambulatory Visit | Attending: Nurse Practitioner | Admitting: Nurse Practitioner

## 2011-02-23 ENCOUNTER — Other Ambulatory Visit: Payer: Self-pay | Admitting: Nurse Practitioner

## 2011-02-23 DIAGNOSIS — R05 Cough: Secondary | ICD-10-CM

## 2011-03-01 ENCOUNTER — Ambulatory Visit: Payer: Medicare HMO | Admitting: Internal Medicine

## 2011-03-14 LAB — CBC
HCT: 35.8 — ABNORMAL LOW
HCT: 37.2
Hemoglobin: 11.8 — ABNORMAL LOW
Hemoglobin: 12.2
MCHC: 32.8
MCHC: 32.9
MCV: 73.1 — ABNORMAL LOW
MCV: 73.2 — ABNORMAL LOW
Platelets: 269
Platelets: 294
RBC: 4.9
RBC: 5.09
RDW: 15.3
RDW: 15.4
WBC: 5.5
WBC: 6.8

## 2011-03-14 LAB — COMPREHENSIVE METABOLIC PANEL
ALT: 17
AST: 17
Albumin: 2.9 — ABNORMAL LOW
Alkaline Phosphatase: 55
BUN: 12
CO2: 31
Calcium: 8.7
Chloride: 107
Creatinine, Ser: 0.83
GFR calc Af Amer: >60
GFR calc non Af Amer: >60
Glucose, Bld: 109 — ABNORMAL HIGH
Potassium: 3.6
Sodium: 140
Total Bilirubin: 0.5
Total Protein: 5.7 — ABNORMAL LOW

## 2011-03-14 LAB — URINALYSIS, ROUTINE W REFLEX MICROSCOPIC
Bilirubin Urine: NEGATIVE
Bilirubin Urine: NEGATIVE
Glucose, UA: NEGATIVE
Glucose, UA: NEGATIVE
Hgb urine dipstick: NEGATIVE
Hgb urine dipstick: NEGATIVE
Ketones, ur: NEGATIVE
Ketones, ur: NEGATIVE
Nitrite: NEGATIVE
Nitrite: NEGATIVE
Protein, ur: NEGATIVE
Protein, ur: NEGATIVE
Specific Gravity, Urine: 1.012
Specific Gravity, Urine: 1.018
Urobilinogen, UA: 0.2
Urobilinogen, UA: 0.2
pH: 5.5
pH: 6.5

## 2011-03-14 LAB — RAPID URINE DRUG SCREEN, HOSP PERFORMED
Amphetamines: NOT DETECTED
Barbiturates: NOT DETECTED
Benzodiazepines: NOT DETECTED
Cocaine: NOT DETECTED
Opiates: NOT DETECTED
Tetrahydrocannabinol: NOT DETECTED

## 2011-03-14 LAB — DIFFERENTIAL
Basophils Absolute: 0
Basophils Relative: 0
Eosinophils Absolute: 0.5
Eosinophils Relative: 7 — ABNORMAL HIGH
Lymphocytes Relative: 24
Lymphs Abs: 1.6
Monocytes Absolute: 0.4
Monocytes Relative: 6
Neutro Abs: 4.2
Neutrophils Relative %: 62

## 2011-03-14 LAB — BASIC METABOLIC PANEL
BUN: 11
CO2: 30
Calcium: 9
Chloride: 99
Creatinine, Ser: 0.83
GFR calc Af Amer: >60
GFR calc non Af Amer: >60
Glucose, Bld: 117 — ABNORMAL HIGH
Potassium: 3.4 — ABNORMAL LOW
Sodium: 138

## 2011-03-15 LAB — PROTIME-INR
INR: 1
Prothrombin Time: 13.4

## 2011-03-15 LAB — APTT: aPTT: 36

## 2011-03-19 LAB — POCT I-STAT, CHEM 8
BUN: 12
Calcium, Ion: 1.06 — ABNORMAL LOW
Chloride: 104
Creatinine, Ser: 1.1
Glucose, Bld: 106 — ABNORMAL HIGH
HCT: 46
Hemoglobin: 15.6 — ABNORMAL HIGH
Potassium: 3.9
Sodium: 136
TCO2: 25

## 2011-03-19 LAB — CBC
HCT: 43.1
Hemoglobin: 14
MCHC: 32.5
MCV: 73.7 — ABNORMAL LOW
Platelets: 248
RBC: 5.85 — ABNORMAL HIGH
RDW: 15.2
WBC: 7.4

## 2011-03-19 LAB — PROTIME-INR
INR: 1
Prothrombin Time: 13.1

## 2011-03-22 LAB — LIPID PANEL
Cholesterol: 158
HDL: 46
LDL Cholesterol: 93
Total CHOL/HDL Ratio: 3.4
Triglycerides: 96
VLDL: 19

## 2011-03-22 LAB — PHENYTOIN LEVEL, TOTAL: Phenytoin Lvl: 7.9 — ABNORMAL LOW

## 2011-03-22 LAB — CBC
HCT: 38.3
HCT: 39.5
Hemoglobin: 12.6
Hemoglobin: 13.1
MCHC: 32.9
MCHC: 33.2
MCV: 73.2 — ABNORMAL LOW
MCV: 73.4 — ABNORMAL LOW
Platelets: 234
Platelets: 268
RBC: 5.21 — ABNORMAL HIGH
RBC: 5.4 — ABNORMAL HIGH
RDW: 16.3 — ABNORMAL HIGH
RDW: 16.3 — ABNORMAL HIGH
WBC: 5.6
WBC: 5.9

## 2011-03-22 LAB — COMPREHENSIVE METABOLIC PANEL
ALT: 16
AST: 25
Albumin: 3.6
Alkaline Phosphatase: 70
BUN: 8
CO2: 27
Calcium: 8.8
Chloride: 103
Creatinine, Ser: 0.9
GFR calc Af Amer: >60
GFR calc non Af Amer: >60
Glucose, Bld: 125 — ABNORMAL HIGH
Potassium: 3.8
Sodium: 137
Total Bilirubin: 0.6
Total Protein: 6.8

## 2011-03-22 LAB — BASIC METABOLIC PANEL
BUN: 9
CO2: 28
Calcium: 8.9
Chloride: 101
Creatinine, Ser: 0.81
GFR calc Af Amer: >60
GFR calc non Af Amer: >60
Glucose, Bld: 107 — ABNORMAL HIGH
Potassium: 3.4 — ABNORMAL LOW
Sodium: 138

## 2011-03-22 LAB — URINALYSIS, ROUTINE W REFLEX MICROSCOPIC
Bilirubin Urine: NEGATIVE
Glucose, UA: NEGATIVE
Hgb urine dipstick: NEGATIVE
Ketones, ur: NEGATIVE
Nitrite: NEGATIVE
Protein, ur: NEGATIVE
Specific Gravity, Urine: 1.015
Urobilinogen, UA: 0.2
pH: 7

## 2011-03-22 LAB — DIFFERENTIAL
Basophils Absolute: 0
Basophils Relative: 0
Eosinophils Absolute: 0
Eosinophils Relative: 1
Lymphocytes Relative: 16
Lymphs Abs: 0.9
Monocytes Absolute: 0.3
Monocytes Relative: 5
Neutro Abs: 4.6
Neutrophils Relative %: 78 — ABNORMAL HIGH

## 2011-03-22 LAB — RAPID URINE DRUG SCREEN, HOSP PERFORMED
Amphetamines: NOT DETECTED
Barbiturates: NOT DETECTED
Benzodiazepines: NOT DETECTED
Cocaine: NOT DETECTED
Opiates: NOT DETECTED
Tetrahydrocannabinol: NOT DETECTED

## 2011-03-22 LAB — ETHANOL: Alcohol, Ethyl (B): <5

## 2011-03-22 LAB — CARDIAC PANEL(CRET KIN+CKTOT+MB+TROPI)
CK, MB: 3.7
CK, MB: 4.2 — ABNORMAL HIGH
Relative Index: 1
Relative Index: 1.2
Total CK: 343 — ABNORMAL HIGH
Total CK: 381 — ABNORMAL HIGH
Troponin I: 0.01
Troponin I: 0.02

## 2011-03-22 LAB — PHOSPHORUS: Phosphorus: 3.9

## 2011-03-22 LAB — PROLACTIN: Prolactin: 2.7

## 2011-03-22 LAB — TSH: TSH: 0.584

## 2011-03-22 LAB — TROPONIN I: Troponin I: 0.01

## 2011-03-22 LAB — MAGNESIUM: Magnesium: 2.2

## 2011-03-22 LAB — CK TOTAL AND CKMB (NOT AT ARMC)
CK, MB: 4.5 — ABNORMAL HIGH
Relative Index: 1.5
Total CK: 309 — ABNORMAL HIGH

## 2011-03-22 LAB — CALCIUM: Calcium: 8.6

## 2011-03-26 LAB — DIFFERENTIAL
Basophils Absolute: 0
Basophils Relative: 1
Eosinophils Absolute: 0.1
Eosinophils Relative: 1
Lymphocytes Relative: 37
Lymphs Abs: 1.6
Monocytes Absolute: 0.4
Monocytes Relative: 9
Neutro Abs: 2.3
Neutrophils Relative %: 52

## 2011-03-26 LAB — BASIC METABOLIC PANEL
BUN: 7
CO2: 28
Calcium: 9.1
Chloride: 107
Creatinine, Ser: 0.68
GFR calc Af Amer: >60
GFR calc non Af Amer: >60
Glucose, Bld: 77
Potassium: 3.5
Sodium: 142

## 2011-03-26 LAB — CBC
HCT: 41.1
Hemoglobin: 13.1
MCHC: 31.9
MCV: 76.7 — ABNORMAL LOW
Platelets: 252
RBC: 5.36 — ABNORMAL HIGH
RDW: 15.4
WBC: 4.5

## 2011-03-26 LAB — URINALYSIS, ROUTINE W REFLEX MICROSCOPIC
Bilirubin Urine: NEGATIVE
Glucose, UA: NEGATIVE
Hgb urine dipstick: NEGATIVE
Ketones, ur: NEGATIVE
Nitrite: NEGATIVE
Protein, ur: NEGATIVE
Specific Gravity, Urine: 1.01
Urobilinogen, UA: 0.2
pH: 7

## 2011-03-26 LAB — PHENYTOIN LEVEL, TOTAL: Phenytoin Lvl: 11.1

## 2011-03-26 LAB — GLUCOSE, CAPILLARY: Glucose-Capillary: 76

## 2011-03-27 LAB — URINALYSIS, ROUTINE W REFLEX MICROSCOPIC
Bilirubin Urine: NEGATIVE
Bilirubin Urine: NEGATIVE
Glucose, UA: NEGATIVE
Glucose, UA: NEGATIVE
Hgb urine dipstick: NEGATIVE
Ketones, ur: NEGATIVE
Ketones, ur: NEGATIVE
Nitrite: NEGATIVE
Nitrite: NEGATIVE
Protein, ur: 100 — AB
Protein, ur: NEGATIVE
Specific Gravity, Urine: 1.004 — ABNORMAL LOW
Specific Gravity, Urine: 1.008
Urobilinogen, UA: 0.2
Urobilinogen, UA: 0.2
pH: 5.5
pH: 7.5

## 2011-03-27 LAB — COMPREHENSIVE METABOLIC PANEL
ALT: 107 — ABNORMAL HIGH
ALT: 51 — ABNORMAL HIGH
ALT: 64 — ABNORMAL HIGH
AST: 29
AST: 32
AST: 74 — ABNORMAL HIGH
Albumin: 3.1 — ABNORMAL LOW
Albumin: 3.3 — ABNORMAL LOW
Albumin: 3.6
Alkaline Phosphatase: 143 — ABNORMAL HIGH
Alkaline Phosphatase: 146 — ABNORMAL HIGH
Alkaline Phosphatase: 151 — ABNORMAL HIGH
BUN: 10
BUN: 11
BUN: 7
CO2: 27
CO2: 29
CO2: 30
Calcium: 9
Calcium: 9.2
Calcium: 9.3
Chloride: 103
Chloride: 104
Chloride: 105
Creatinine, Ser: 0.78
Creatinine, Ser: 0.79
Creatinine, Ser: 0.81
GFR calc Af Amer: >60
GFR calc Af Amer: >60
GFR calc Af Amer: >60
GFR calc non Af Amer: >60
GFR calc non Af Amer: >60
GFR calc non Af Amer: >60
Glucose, Bld: 86
Glucose, Bld: 93
Glucose, Bld: 99
Potassium: 3.6
Potassium: 3.9
Potassium: 4
Sodium: 138
Sodium: 140
Sodium: 142
Total Bilirubin: 0.5
Total Bilirubin: 0.6
Total Bilirubin: 0.7
Total Protein: 6.6
Total Protein: 6.9
Total Protein: 7

## 2011-03-27 LAB — GLUCOSE, CAPILLARY
Glucose-Capillary: 148 — ABNORMAL HIGH
Glucose-Capillary: 100 — ABNORMAL HIGH
Glucose-Capillary: 100 — ABNORMAL HIGH
Glucose-Capillary: 100 — ABNORMAL HIGH
Glucose-Capillary: 102 — ABNORMAL HIGH
Glucose-Capillary: 102 — ABNORMAL HIGH
Glucose-Capillary: 102 — ABNORMAL HIGH
Glucose-Capillary: 107 — ABNORMAL HIGH
Glucose-Capillary: 107 — ABNORMAL HIGH
Glucose-Capillary: 108 — ABNORMAL HIGH
Glucose-Capillary: 109 — ABNORMAL HIGH
Glucose-Capillary: 112 — ABNORMAL HIGH
Glucose-Capillary: 114 — ABNORMAL HIGH
Glucose-Capillary: 114 — ABNORMAL HIGH
Glucose-Capillary: 118 — ABNORMAL HIGH
Glucose-Capillary: 119 — ABNORMAL HIGH
Glucose-Capillary: 122 — ABNORMAL HIGH
Glucose-Capillary: 128 — ABNORMAL HIGH
Glucose-Capillary: 132 — ABNORMAL HIGH
Glucose-Capillary: 133 — ABNORMAL HIGH
Glucose-Capillary: 137 — ABNORMAL HIGH
Glucose-Capillary: 169 — ABNORMAL HIGH
Glucose-Capillary: 82
Glucose-Capillary: 84
Glucose-Capillary: 88
Glucose-Capillary: 89
Glucose-Capillary: 90
Glucose-Capillary: 91
Glucose-Capillary: 95
Glucose-Capillary: 95
Glucose-Capillary: 95
Glucose-Capillary: 96
Glucose-Capillary: 98
Glucose-Capillary: 98

## 2011-03-27 LAB — URINE CULTURE
Colony Count: >=100000
Special Requests: NEGATIVE

## 2011-03-27 LAB — LIPID PANEL
Cholesterol: 202 — ABNORMAL HIGH
HDL: 53
LDL Cholesterol: 127 — ABNORMAL HIGH
Total CHOL/HDL Ratio: 3.8
Triglycerides: 111
VLDL: 22

## 2011-03-27 LAB — CBC
HCT: 40.1
HCT: 40.5
Hemoglobin: 12.9
Hemoglobin: 13
MCHC: 32.1
MCHC: 32.1
MCV: 76.8 — ABNORMAL LOW
MCV: 77.9 — ABNORMAL LOW
Platelets: 229
Platelets: 243
RBC: 5.21 — ABNORMAL HIGH
RBC: 5.23 — ABNORMAL HIGH
RDW: 15.1
RDW: 15.3
WBC: 5
WBC: 7.9

## 2011-03-27 LAB — URINE MICROSCOPIC-ADD ON

## 2011-03-27 LAB — BASIC METABOLIC PANEL
BUN: 12
CO2: 29
Calcium: 9
Chloride: 104
Creatinine, Ser: 0.71
GFR calc Af Amer: >60
GFR calc non Af Amer: >60
Glucose, Bld: 93
Potassium: 3.8
Sodium: 139

## 2011-03-27 LAB — HEMOGLOBIN A1C
Hgb A1c MFr Bld: 6.1
Mean Plasma Glucose: 128

## 2011-03-27 LAB — DIFFERENTIAL
Basophils Absolute: 0
Basophils Relative: 0
Eosinophils Absolute: 0.1
Eosinophils Relative: 1
Lymphocytes Relative: 23
Lymphs Abs: 1.9
Monocytes Absolute: 0.5
Monocytes Relative: 6
Neutro Abs: 5.5
Neutrophils Relative %: 69

## 2011-03-27 LAB — PHENYTOIN LEVEL, TOTAL
Phenytoin Lvl: 9.3 — ABNORMAL LOW
Phenytoin Lvl: 2.8 — ABNORMAL LOW
Phenytoin Lvl: 7.8 — ABNORMAL LOW
Phenytoin Lvl: 8.2 — ABNORMAL LOW

## 2011-03-27 LAB — PROLACTIN
Prolactin: 11.7
Prolactin: 4.6
Prolactin: 8.9

## 2011-03-27 LAB — TSH: TSH: 1.544

## 2011-03-27 LAB — RPR: RPR Ser Ql: NONREACTIVE

## 2011-03-27 LAB — VITAMIN B12: Vitamin B-12: 1023 — ABNORMAL HIGH (ref 211–911)

## 2011-03-27 LAB — FOLATE: Folate: >20

## 2011-03-27 LAB — AMMONIA: Ammonia: 36 — ABNORMAL HIGH

## 2011-04-09 ENCOUNTER — Other Ambulatory Visit: Payer: Self-pay | Admitting: Gastroenterology

## 2011-04-11 ENCOUNTER — Ambulatory Visit (INDEPENDENT_AMBULATORY_CARE_PROVIDER_SITE_OTHER): Payer: Medicare HMO | Admitting: Internal Medicine

## 2011-04-11 ENCOUNTER — Encounter: Payer: Self-pay | Admitting: Internal Medicine

## 2011-04-11 VITALS — BP 158/90 | HR 62 | Ht 61.0 in | Wt 243.6 lb

## 2011-04-11 DIAGNOSIS — I82409 Acute embolism and thrombosis of unspecified deep veins of unspecified lower extremity: Secondary | ICD-10-CM

## 2011-04-11 DIAGNOSIS — Z23 Encounter for immunization: Secondary | ICD-10-CM

## 2011-04-11 DIAGNOSIS — J9620 Acute and chronic respiratory failure, unspecified whether with hypoxia or hypercapnia: Secondary | ICD-10-CM

## 2011-04-11 DIAGNOSIS — E662 Morbid (severe) obesity with alveolar hypoventilation: Secondary | ICD-10-CM

## 2011-04-11 DIAGNOSIS — R609 Edema, unspecified: Secondary | ICD-10-CM

## 2011-04-11 DIAGNOSIS — J9602 Acute respiratory failure with hypercapnia: Secondary | ICD-10-CM

## 2011-04-11 MED ORDER — ALBUTEROL SULFATE HFA 108 (90 BASE) MCG/ACT IN AERS: 2.0000 | INHALATION_SPRAY | RESPIRATORY_TRACT | Status: DC | PRN

## 2011-04-11 NOTE — Progress Notes (Signed)
Patient ID: [name], female    DOB: 09-25-1945, 65 y.o.   MRN: [id number]  HPI 10/30/10- 46 yo F former smoker, here with sister on referral from Celine Ahr at Dr Currie Paris office, concerned about dyspnea.. She had been a 1/2 PPD smoker. Hospitalized in February 2012 with gallstone pancreatitis. Started O2 then at 2 L/M Advanced. She denies lung disease prior, but would wake with cough before the [facility]. May not have noticed her breathing limitation before. Now DOE walking to mailbox or across parking lot on room air, not at rest..She is not aware of wheeze, chest pain, palpitation, purulent sputum, fluid retention or blood.D/C summary reviewed, noting D/C dx including hypercapneic respiratory failure, diastolic heart failure, NSTMI w/o CAD on cath.  She is pendng surgery for pancreatic pseudocyst.  Denies past hx of pneumonia, asthma or heart disease.  Had a remote episode of phlebitis w/o PE.  Medical problems include HBP, palpitation, hyperlipidemia.   12/28/10- 45 yo F former smoker followed for respiratory failure with hypercapnia, OHS, complicated by diastolic CHF Says she is doing pretty well. Uses O2 at 2 L/M for sleep and if out in heat/ prn, but not usually needed around the house. Wheezes some on steps. Coughs occasionally with scant white phlegm. Occasional twinge shoots left upper anterior chest, no palpitation. Legs swell some and occasional cramp in tight calf, relieved by stretch.  CXR 08/25/10- retrocardiac atelectasis, small effusions, CE PFT- 12/29/10- Mild restriction and reduction of DLCO. FEV1/FVC 0.86, TLC 68%; DLCO 60%.  04/11/11- 52 yo F former smoker followed for respiratory failure with hypercapnia, OHS, complicated by diastolic CHF Female companion ? Sister here. Advair has helped her a little with shortness of breath. Since last here she was hospitalized for gallbladder/pancreas surgery. She got through that without significant respiratory complication and now  feels better. Legs swell. Mother had a history of clots. D-dimer was 1.85. She thought she had Doppler studies of leg veins that turned out normal but I don't find that report  Review of Systems See HPI Constitutional:   No-   weight loss, night sweats, fevers, chills, fatigue, lassitude. HEENT:   No-  headaches, difficulty swallowing, tooth/dental problems, sore throat,       No-  sneezing, itching, ear ache, nasal congestion, post nasal drip,  CV:  No-   chest pain, orthopnea, PND, swelling in lower extremities, anasarca, dizziness, palpitations Resp: + shortness of breath with exertion or at rest.              No-   productive cough,  No non-productive cough,  No- coughing up of blood.              No-   change in color of mucus.  No- wheezing.   Skin: No-   rash or lesions. GI:  No-   heartburn, indigestion, abdominal pain, nausea, vomiting, diarrhea,                 change in bowel habits, loss of appetite GU: No-   dysuria, change in color of urine, no urgency or frequency.  No- flank pain. MS:  No-   joint pain,  + bilateral swelling.  No- decreased range of motion.  No- back pain. Neuro-     nothing unusual Psych:  No- change in mood or affect. No depression or anxiety.  No memory loss.  OBJ General- Alert, Oriented, Affect-appropriate, Distress- none acute, very obese Skin- rash-none, lesions- none, excoriation- none Lymphadenopathy-  none Head- atraumatic            Eyes- Gross vision intact, PERRLA, conjunctivae clear secretions            Ears- Hearing, canals-normal            Nose- Clear, no-Septal dev, mucus, polyps, erosion, perforation             Throat- Mallampati II , mucosa clear , drainage- none, tonsils- atrophic Neck- flexible , trachea midline, no stridor , thyroid nl, carotid no bruit Chest - symmetrical excursion , unlabored           Heart/CV- RRR , 1/6 systolic murmur LUSB , no gallop  , no rub, nl s1 s2                           - JVD- slight , edema-  none, stasis changes- none, varices- none           Lung- clear to P&A, wheeze- none, cough- none , dullness-none, rub- none           Chest wall-  Abd- tender-no, distended-no, bowel sounds-present, HSM- no Br/ Gen/ Rectal- Not done, not indicated Extrem- cyanosis- none, clubbing, none, atrophy- none, strength- nl. Superficial varices, Homans negative and no significant edema today. Neuro- grossly intact to observation

## 2011-04-11 NOTE — Patient Instructions (Signed)
Script for [employer] rescue inhaler - try 2 puffs, up to 4 times daily as needed for shortness of breath. You can do it before exertion.  Flu vax  Order doppler bilateral leg veins      Dx DVT

## 2011-04-12 ENCOUNTER — Ambulatory Visit
Admission: RE | Admit: 2011-04-12 | Discharge: 2011-04-12 | Disposition: A | Discharge status: Home / Self Care | Payer: Medicare HMO | Source: Ambulatory Visit | Attending: Gastroenterology | Admitting: Gastroenterology

## 2011-04-12 MED ORDER — IOHEXOL 300 MG/ML  SOLN
125.0000 mL | Freq: Once | INTRAMUSCULAR | Status: AC | PRN
  Administered 2011-04-12: 125 mL via INTRAVENOUS

## 2011-04-13 ENCOUNTER — Encounter: Payer: Medicare HMO | Admitting: *Deleted

## 2011-04-13 ENCOUNTER — Telehealth: Payer: Self-pay | Admitting: Internal Medicine

## 2011-04-13 ENCOUNTER — Other Ambulatory Visit: Payer: Self-pay | Admitting: Internal Medicine

## 2011-04-13 ENCOUNTER — Encounter (INDEPENDENT_AMBULATORY_CARE_PROVIDER_SITE_OTHER): Payer: Medicare HMO | Admitting: *Deleted

## 2011-04-13 ENCOUNTER — Other Ambulatory Visit: Payer: Self-pay | Admitting: Cardiology

## 2011-04-13 DIAGNOSIS — R0602 Shortness of breath: Secondary | ICD-10-CM

## 2011-04-13 DIAGNOSIS — I82409 Acute embolism and thrombosis of unspecified deep veins of unspecified lower extremity: Secondary | ICD-10-CM

## 2011-04-13 DIAGNOSIS — M7989 Other specified soft tissue disorders: Secondary | ICD-10-CM

## 2011-04-13 NOTE — Telephone Encounter (Signed)
Spoke to [name] she is aware the pcc's do not put in these orders and that the tech spoke to [place] the nurse for dr young they straightened it out earler today

## 2011-04-14 DIAGNOSIS — R600 Localized edema: Secondary | ICD-10-CM | POA: Insufficient documentation

## 2011-04-14 DIAGNOSIS — R609 Edema, unspecified: Secondary | ICD-10-CM | POA: Insufficient documentation

## 2011-04-14 NOTE — Assessment & Plan Note (Addendum)
Oxygenation is adequate today with room air saturation 93% at rest. Advair was too expensive for her. We have discussed appropriate use of a rescue inhaler and will try that for now. Flu shot.

## 2011-04-14 NOTE — Assessment & Plan Note (Signed)
Significant weight loss make any port and improvement in her medical problems and I discussed this. She has not been actively considering bariatric surgery.

## 2011-04-14 NOTE — Assessment & Plan Note (Signed)
D-dimer was elevated at 1.85 when she was dealing with her gallbladder and pancreatic problem. It is nonspecific. Or leg edema by itself may just reflect peripheral venous insufficiency from obesity but it will be prudent to check leg veins by Doppler.

## 2011-04-19 NOTE — Progress Notes (Signed)
Quick Note:  Was told okay to speak with sister-aware of results. ______

## 2011-04-26 ENCOUNTER — Encounter: Payer: Medicare HMO | Admitting: *Deleted

## 2011-07-11 ENCOUNTER — Ambulatory Visit: Payer: Medicare HMO | Admitting: Internal Medicine

## 2011-07-27 ENCOUNTER — Other Ambulatory Visit: Payer: Self-pay | Admitting: Allergy

## 2011-07-27 MED ORDER — ALBUTEROL SULFATE HFA 108 (90 BASE) MCG/ACT IN AERS: 2.0000 | INHALATION_SPRAY | RESPIRATORY_TRACT | Status: DC | PRN

## 2011-08-05 ENCOUNTER — Emergency Department (HOSPITAL_COMMUNITY): Payer: Medicare HMO

## 2011-08-05 ENCOUNTER — Emergency Department (HOSPITAL_COMMUNITY)
Admission: EM | Admit: 2011-08-05 | Discharge: 2011-08-05 | Disposition: A | Discharge status: Home / Self Care | Payer: Medicare HMO | Attending: Emergency Medicine | Admitting: Emergency Medicine

## 2011-08-05 ENCOUNTER — Other Ambulatory Visit: Payer: Self-pay

## 2011-08-05 DIAGNOSIS — I5030 Unspecified diastolic (congestive) heart failure: Secondary | ICD-10-CM | POA: Insufficient documentation

## 2011-08-05 DIAGNOSIS — R51 Headache: Secondary | ICD-10-CM | POA: Insufficient documentation

## 2011-08-05 DIAGNOSIS — I10 Essential (primary) hypertension: Secondary | ICD-10-CM | POA: Insufficient documentation

## 2011-08-05 DIAGNOSIS — E785 Hyperlipidemia, unspecified: Secondary | ICD-10-CM | POA: Insufficient documentation

## 2011-08-05 DIAGNOSIS — K5730 Diverticulosis of large intestine without perforation or abscess without bleeding: Secondary | ICD-10-CM | POA: Insufficient documentation

## 2011-08-05 DIAGNOSIS — G4733 Obstructive sleep apnea (adult) (pediatric): Secondary | ICD-10-CM | POA: Insufficient documentation

## 2011-08-05 DIAGNOSIS — G40909 Epilepsy, unspecified, not intractable, without status epilepticus: Secondary | ICD-10-CM

## 2011-08-05 DIAGNOSIS — R269 Unspecified abnormalities of gait and mobility: Secondary | ICD-10-CM | POA: Insufficient documentation

## 2011-08-05 DIAGNOSIS — G40802 Other epilepsy, not intractable, without status epilepticus: Secondary | ICD-10-CM | POA: Insufficient documentation

## 2011-08-05 DIAGNOSIS — I252 Old myocardial infarction: Secondary | ICD-10-CM | POA: Insufficient documentation

## 2011-08-05 DIAGNOSIS — Z7982 Long term (current) use of aspirin: Secondary | ICD-10-CM | POA: Insufficient documentation

## 2011-08-05 DIAGNOSIS — Z79899 Other long term (current) drug therapy: Secondary | ICD-10-CM | POA: Insufficient documentation

## 2011-08-05 DIAGNOSIS — E119 Type 2 diabetes mellitus without complications: Secondary | ICD-10-CM | POA: Insufficient documentation

## 2011-08-05 LAB — DIFFERENTIAL
Basophils Absolute: 0 10*3/uL (ref 0.0–0.1)
Basophils Relative: 0 % (ref 0–1)
Eosinophils Absolute: 0 10*3/uL (ref 0.0–0.7)
Eosinophils Relative: 1 % (ref 0–5)
Lymphocytes Relative: 26 % (ref 12–46)
Lymphs Abs: 1.7 10*3/uL (ref 0.7–4.0)
Monocytes Absolute: 0.4 10*3/uL (ref 0.1–1.0)
Monocytes Relative: 6 % (ref 3–12)
Neutro Abs: 4.3 10*3/uL (ref 1.7–7.7)
Neutrophils Relative %: 67 % (ref 43–77)

## 2011-08-05 LAB — BASIC METABOLIC PANEL
BUN: 11 mg/dL (ref 6–23)
CO2: 29 mEq/L (ref 19–32)
Calcium: 9.2 mg/dL (ref 8.4–10.5)
Chloride: 101 mEq/L (ref 96–112)
Creatinine, Ser: 0.8 mg/dL (ref 0.50–1.10)
GFR calc Af Amer: 89 mL/min — ABNORMAL LOW (ref >90)
GFR calc non Af Amer: 77 mL/min — ABNORMAL LOW (ref >90)
Glucose, Bld: 157 mg/dL — ABNORMAL HIGH (ref 70–99)
Potassium: 3.7 mEq/L (ref 3.5–5.1)
Sodium: 138 mEq/L (ref 135–145)

## 2011-08-05 LAB — URINALYSIS, ROUTINE W REFLEX MICROSCOPIC
Bilirubin Urine: NEGATIVE
Glucose, UA: NEGATIVE mg/dL
Hgb urine dipstick: NEGATIVE
Ketones, ur: NEGATIVE mg/dL
Leukocytes, UA: NEGATIVE
Nitrite: NEGATIVE
Protein, ur: NEGATIVE mg/dL
Specific Gravity, Urine: 1.009 (ref 1.005–1.030)
Urobilinogen, UA: 0.2 mg/dL (ref 0.0–1.0)
pH: 6 (ref 5.0–8.0)

## 2011-08-05 LAB — CBC
HCT: 36.4 % (ref 36.0–46.0)
Hemoglobin: 11.7 g/dL — ABNORMAL LOW (ref 12.0–15.0)
MCH: 23.4 pg — ABNORMAL LOW (ref 26.0–34.0)
MCHC: 32.1 g/dL (ref 30.0–36.0)
MCV: 72.7 fL — ABNORMAL LOW (ref 78.0–100.0)
Platelets: 252 10*3/uL (ref 150–400)
RBC: 5.01 MIL/uL (ref 3.87–5.11)
RDW: 16.6 % — ABNORMAL HIGH (ref 11.5–15.5)
WBC: 6.4 10*3/uL (ref 4.0–10.5)

## 2011-08-05 MED ORDER — LORAZEPAM 2 MG/ML IJ SOLN
1.0000 mg | Freq: Once | INTRAMUSCULAR | Status: AC
  Administered 2011-08-05: 1 mg via INTRAVENOUS
  Filled 2011-08-05: qty 1

## 2011-08-05 NOTE — ED Notes (Signed)
Family report that pt has had multiple short seizures over the last few days. They reported that they last approx. 5 min. Followed by a period of weakness. Currently slow to speak or stand. All verbal and nonverbal reactions appropriate.

## 2011-08-05 NOTE — ED Notes (Signed)
Pt. To MRI

## 2011-08-05 NOTE — ED Provider Notes (Signed)
History     CSN: [id number]  Arrival date & time 08/05/11  1120   First MD Initiated Contact with Patient 08/05/11 1146      Chief Complaint  Patient presents with  . Seizures    Intermittent seizure x3 days. Last approx 5 min.     (Consider location/radiation/quality/duration/timing/severity/associated sxs/prior treatment) HPI History provided by pt and her sister.  Patient lives with her sister who administers her medications.  Her sister reports that she has been compliant w/ her lamictal and neurontin.  She had three generalized tonic-clonic seizures this morning, each lasting 3-4 minutes.  Most recent seizure was 1 week ago and prior to that it had been several months.  Pt reports that she did not bite her tongue but was incontinent of urine.  Recent illnesses include approx 3 days of nausea as well as dysuria.  Has also had headache.  Had a brief sharp pain behind left eye 2 days ago and yesterday she had a frontal headache that lasted several hours.  H/o migraines but this headache felt different.  Has a headache currently but typical for post-ictal period.  No recent head trauma.  Recent medication changes include lipitor and abilify which are new to her.   Past Medical History  Diagnosis Date  . Myocardial infarction   . Respiratory failure      multifactorial secondary to congestive heart failure, morbid obesity, decreased abdominal  compliance in the setting of pancreatitis.  . Diastolic heart failure   . Leukocytosis     secondary to steroid and pancreatitis.  . DM (diabetes mellitus)   . HTN (hypertension)   . Seizure disorder   . Hyperlipidemia   . Back pain   . Obstructive sleep apnea   . Empty sella     on MRI in 2009.  . Diverticulitis   . Abnormal liver function      in the past.  . Headache   . Pneumonia     Past Surgical History  Procedure Date  . Cardiac catheterization   . Cholecystectomy   . Abdominal hysterectomy   . Tubal ligation   .  Tonsillectomy   . Appendectomy     Family History  Problem Relation Age of Onset  . Allergies Father   . Heart disease Father   . Heart disease Mother     History  Substance Use Topics  . Smoking status: Former Smoker -- 0.5 packs/day for 25 years    Types: Cigarettes    Quit date: 06/25/1992  . Smokeless tobacco: Never Used  . Alcohol Use: No    OB History    Grav Para Term Preterm Abortions TAB SAB Ect Mult Living                  Review of Systems  All other systems reviewed and are negative.    Allergies  Codeine; Penicillins; and Sulfa antibiotics  Home Medications   Current Outpatient Rx  Name Route Sig Dispense Refill  . ALBUTEROL SULFATE HFA 108 (90 BASE) MCG/ACT IN AERS Inhalation Inhale 2 puffs into the lungs every 4 (four) hours as needed for wheezing or shortness of breath. 1 Inhaler 5  . ARIPIPRAZOLE 5 MG PO TABS Oral Take 5 mg by mouth daily.      . ASPIRIN 81 MG PO TABS Oral Take 81 mg by mouth daily.      Marland Kitchen FERROUS SULFATE 325 (65 FE) MG PO TABS Oral Take 325 mg by mouth daily  with breakfast.      . FUROSEMIDE 40 MG PO TABS Oral Take 40 mg by mouth 2 (two) times daily.      Marland Kitchen GABAPENTIN 300 MG PO CAPS Oral Take 300 mg by mouth 3 (three) times daily.      . INSULIN DETEMIR 100 UNIT/ML [place] SOLN  16 units qd     . ISOSORBIDE MONONITRATE ER 30 MG PO TB24 Oral Take 30 mg by mouth daily.      Marland Kitchen LAMOTRIGINE 100 MG PO TABS Oral Take 100 mg by mouth 2 (two) times daily.      Marland Kitchen ONE-DAILY MULTI VITAMINS PO TABS Oral Take 1 tablet by mouth daily.      . NEBIVOLOL HCL 10 MG PO TABS Oral Take 10 mg by mouth daily.      Marland Kitchen FISH OIL 1000 MG PO CAPS  1 tab po qd     . PANCRELIPASE (LIP-PROT-AMYL) 25000 UNITS PO CPEP  Take 2 tablets by mouth with each meal and 1 tablet by mouth at a snack time.     Marland Kitchen PANTOPRAZOLE SODIUM 40 MG PO TBEC Oral Take 40 mg by mouth daily.      Marland Kitchen POLYETHYLENE GLYCOL 3350 PO PACK Oral Take 17 g by mouth daily.      Marland Kitchen POTASSIUM CHLORIDE CRYS ER  20 MEQ PO TBCR Oral Take 20 mEq by mouth daily.      . SERTRALINE HCL 50 MG PO TABS Oral Take 100 mg by mouth daily.       BP 151/75  Pulse 65  Resp 24  SpO2 96%  Physical Exam  Nursing note and vitals reviewed. Constitutional: She is oriented to person, place, and time. She appears well-developed and well-nourished. No distress.       Morbid obesity. Drowsy.  HENT:  Head: Normocephalic.  Eyes:       Normal appearance  Neck: Normal range of motion.  Cardiovascular: Normal rate and regular rhythm.   Pulmonary/Chest: Effort normal and breath sounds normal.  Abdominal: Soft. Bowel sounds are normal. She exhibits no distension and no mass. There is no rebound and no guarding.       Mild tenderness mid-line lower abdomen  Neurological: She is alert and oriented to person, place, and time. No cranial nerve deficit or sensory deficit. Coordination normal.       Decreased strength as well as sensation in right face and extremities compared to left.     Skin: Skin is warm and dry. No rash noted.  Psychiatric: She has a normal mood and affect. Her behavior is normal.    ED Course  Procedures (including critical care time)   Date: 08/05/2011  Rate: 62  Rhythm: normal sinus rhythm  QRS Axis: normal  Intervals: normal  ST/T Wave abnormalities: normal  Conduction Disutrbances:first-degree A-V block   Narrative Interpretation:   Old EKG Reviewed: changes noted (new 1st deg AV block)   Labs Reviewed  CBC - Abnormal; Notable for the following:    Hemoglobin 11.7 (*)    MCV 72.7 (*)    MCH 23.4 (*)    RDW 16.6 (*)    All other components within normal limits  BASIC METABOLIC PANEL - Abnormal; Notable for the following:    Glucose, Bld 157 (*)    GFR calc non Af Amer 77 (*)    GFR calc Af Amer 89 (*)    All other components within normal limits  DIFFERENTIAL  URINALYSIS, ROUTINE W REFLEX  MICROSCOPIC  LAB REPORT - SCANNED   Ct Head Wo Contrast  08/05/2011  *RADIOLOGY REPORT*   Clinical Data: Seizures.  CT HEAD WITHOUT CONTRAST  Technique:  Contiguous axial images were obtained from the base of the skull through the vertex without contrast.  Comparison: Head CT 05/20/2009.  Findings: The ventricles are normal.  No extra-axial fluid collections are seen.  The brainstem and cerebellum are unremarkable.  No acute intracranial findings such as infarction or hemorrhage.  No mass lesions.  The bony calvarium is intact.  The visualized paranasal sinuses and mastoid air cells are clear.  Bilateral exophthalmos is stable.  IMPRESSION: No acute intracranial findings for mass lesions.  Original Report Authenticated By: P. Loralie Champagne, M.D.   Mr Brain Wo Contrast  08/05/2011  *RADIOLOGY REPORT*  Clinical Data: Seizure  MRI HEAD WITHOUT CONTRAST  Technique:  Multiplanar, multiecho pulse sequences of the brain and surrounding structures were obtained according to standard protocol without intravenous contrast.  Comparison: CT 08/05/2011, MRI 05/22/2009  Findings: Image quality degraded by mild motion.  Ventricle size is normal.  Empty sella is noted.  Negative for acute infarct.  Small scattered white matter hyperintensities are present in the cerebral white matter bilaterally compatible with chronic ischemia.  Brainstem is normal. Basal ganglia is normal.  Negative for hemorrhage or mass lesion.  Temporal lobes are normal bilaterally.  IMPRESSION: Mild chronic microvascular ischemia.  No acute abnormality.  Original Report Authenticated By: Camelia Phenes, M.D.     1. Seizure disorder       MDM  66yo F presents w/ h/o seizure disorder for which she is compliant w/ lamictal and neurontin, presents w/ c/o multiple seizures this morning and recent headaches.  No illnesses w/ exception of nausea and dysuria and no recent head trauma.  Pt drowsy but oriented x 3, no focal neuro deficits, abd benign but mild, mid-line lower abd ttp on exam.  CT head ordered b/c unusual for her to have back to  back seizures and has had atypical headaches the past 2 days.  Labs have resulted and are unremarkable, including U/A.  Pt received 1mg  IV ativan.    Pt sleeping.  VSS. CT head still pending.  1:23 PM   CT head negative.  All results discussed w/ pt and her family.  Her family now mentions that for the past 2 weeks, she has been off balance.  She takes very wide turns when she is changing directions while walking and has complained of feeling "woozy" at times.  They had attributed to her starting lipitor.  Did not ambulate pt because she is still post-ictal and nursing staff reports that she almost fell when they stood her at edge of bed in attempt to get a urine sample.  Discussed w/ Dr. Rosalia Hammers and is in agreement that an MRI would be reasonable.    MRI brain neg.  Pt referred back to her neurologist and advised to continue seizure medications as prescribed.  Return precautions discussed.        Arie Sabina Becca Bayne, PA 08/06/11 1120

## 2011-08-05 NOTE — ED Notes (Signed)
Patient attempted to get out of bed to go to restroom.  Patient was unable to stand.  Patient used female urinal.

## 2011-08-13 NOTE — ED Provider Notes (Signed)
Patient seen and evaluated and agree with assessment and plan as documented.   [name] Quarry, MD 08/13/11 [phone]

## 2011-08-17 ENCOUNTER — Ambulatory Visit: Payer: Medicare HMO | Admitting: Internal Medicine

## 2011-09-13 ENCOUNTER — Emergency Department (HOSPITAL_COMMUNITY): Payer: Medicare HMO

## 2011-09-13 ENCOUNTER — Encounter (HOSPITAL_COMMUNITY): Payer: Self-pay | Admitting: *Deleted

## 2011-09-13 ENCOUNTER — Emergency Department (HOSPITAL_COMMUNITY)
Admission: EM | Admit: 2011-09-13 | Discharge: 2011-09-13 | Disposition: A | Discharge status: Home / Self Care | Payer: Medicare HMO | Attending: Emergency Medicine | Admitting: Emergency Medicine

## 2011-09-13 ENCOUNTER — Ambulatory Visit: Payer: Medicare HMO | Admitting: Internal Medicine

## 2011-09-13 ENCOUNTER — Other Ambulatory Visit: Payer: Self-pay

## 2011-09-13 DIAGNOSIS — R142 Eructation: Secondary | ICD-10-CM | POA: Insufficient documentation

## 2011-09-13 DIAGNOSIS — G40909 Epilepsy, unspecified, not intractable, without status epilepticus: Secondary | ICD-10-CM | POA: Insufficient documentation

## 2011-09-13 DIAGNOSIS — Z79899 Other long term (current) drug therapy: Secondary | ICD-10-CM | POA: Insufficient documentation

## 2011-09-13 DIAGNOSIS — I10 Essential (primary) hypertension: Secondary | ICD-10-CM | POA: Insufficient documentation

## 2011-09-13 DIAGNOSIS — R112 Nausea with vomiting, unspecified: Secondary | ICD-10-CM | POA: Insufficient documentation

## 2011-09-13 DIAGNOSIS — R109 Unspecified abdominal pain: Secondary | ICD-10-CM | POA: Insufficient documentation

## 2011-09-13 DIAGNOSIS — G4733 Obstructive sleep apnea (adult) (pediatric): Secondary | ICD-10-CM | POA: Insufficient documentation

## 2011-09-13 DIAGNOSIS — I5030 Unspecified diastolic (congestive) heart failure: Secondary | ICD-10-CM | POA: Insufficient documentation

## 2011-09-13 DIAGNOSIS — R141 Gas pain: Secondary | ICD-10-CM | POA: Insufficient documentation

## 2011-09-13 DIAGNOSIS — R143 Flatulence: Secondary | ICD-10-CM | POA: Insufficient documentation

## 2011-09-13 DIAGNOSIS — I252 Old myocardial infarction: Secondary | ICD-10-CM | POA: Insufficient documentation

## 2011-09-13 DIAGNOSIS — E785 Hyperlipidemia, unspecified: Secondary | ICD-10-CM | POA: Insufficient documentation

## 2011-09-13 DIAGNOSIS — R630 Anorexia: Secondary | ICD-10-CM | POA: Insufficient documentation

## 2011-09-13 DIAGNOSIS — R509 Fever, unspecified: Secondary | ICD-10-CM | POA: Insufficient documentation

## 2011-09-13 DIAGNOSIS — E119 Type 2 diabetes mellitus without complications: Secondary | ICD-10-CM | POA: Insufficient documentation

## 2011-09-13 DIAGNOSIS — Z794 Long term (current) use of insulin: Secondary | ICD-10-CM | POA: Insufficient documentation

## 2011-09-13 LAB — URINALYSIS, ROUTINE W REFLEX MICROSCOPIC
Bilirubin Urine: NEGATIVE
Glucose, UA: NEGATIVE mg/dL
Hgb urine dipstick: NEGATIVE
Ketones, ur: NEGATIVE mg/dL
Leukocytes, UA: NEGATIVE
Nitrite: NEGATIVE
Protein, ur: NEGATIVE mg/dL
Specific Gravity, Urine: 1.012 (ref 1.005–1.030)
Urobilinogen, UA: 0.2 mg/dL (ref 0.0–1.0)
pH: 6.5 (ref 5.0–8.0)

## 2011-09-13 LAB — DIFFERENTIAL
Basophils Absolute: 0 10*3/uL (ref 0.0–0.1)
Basophils Relative: 0 % (ref 0–1)
Eosinophils Absolute: 0.1 10*3/uL (ref 0.0–0.7)
Eosinophils Relative: 2 % (ref 0–5)
Lymphocytes Relative: 29 % (ref 12–46)
Lymphs Abs: 1.7 10*3/uL (ref 0.7–4.0)
Monocytes Absolute: 0.4 10*3/uL (ref 0.1–1.0)
Monocytes Relative: 6 % (ref 3–12)
Neutro Abs: 3.8 10*3/uL (ref 1.7–7.7)
Neutrophils Relative %: 63 % (ref 43–77)

## 2011-09-13 LAB — CBC
HCT: 36.8 % (ref 36.0–46.0)
Hemoglobin: 11.7 g/dL — ABNORMAL LOW (ref 12.0–15.0)
MCH: 23.2 pg — ABNORMAL LOW (ref 26.0–34.0)
MCHC: 31.8 g/dL (ref 30.0–36.0)
MCV: 73 fL — ABNORMAL LOW (ref 78.0–100.0)
Platelets: 281 10*3/uL (ref 150–400)
RBC: 5.04 MIL/uL (ref 3.87–5.11)
RDW: 15.6 % — ABNORMAL HIGH (ref 11.5–15.5)
WBC: 6.1 10*3/uL (ref 4.0–10.5)

## 2011-09-13 LAB — COMPREHENSIVE METABOLIC PANEL
ALT: 13 U/L (ref 0–35)
AST: 26 U/L (ref 0–37)
Albumin: 3.7 g/dL (ref 3.5–5.2)
Alkaline Phosphatase: 87 U/L (ref 39–117)
BUN: 9 mg/dL (ref 6–23)
CO2: 29 mEq/L (ref 19–32)
Calcium: 9 mg/dL (ref 8.4–10.5)
Chloride: 96 mEq/L (ref 96–112)
Creatinine, Ser: 0.78 mg/dL (ref 0.50–1.10)
GFR calc Af Amer: >90 mL/min (ref >90)
GFR calc non Af Amer: 87 mL/min — ABNORMAL LOW (ref >90)
Glucose, Bld: 87 mg/dL (ref 70–99)
Potassium: 4.4 mEq/L (ref 3.5–5.1)
Sodium: 134 mEq/L — ABNORMAL LOW (ref 135–145)
Total Bilirubin: 0.2 mg/dL — ABNORMAL LOW (ref 0.3–1.2)
Total Protein: 7.6 g/dL (ref 6.0–8.3)

## 2011-09-13 LAB — LIPASE, BLOOD: Lipase: 29 U/L (ref 11–59)

## 2011-09-13 MED ORDER — SODIUM CHLORIDE 0.9 % IV SOLN
Freq: Once | INTRAVENOUS | Status: AC
  Administered 2011-09-13: 12:00:00 via INTRAVENOUS

## 2011-09-13 MED ORDER — ONDANSETRON HCL 4 MG/2ML IJ SOLN
4.0000 mg | Freq: Once | INTRAMUSCULAR | Status: AC
  Administered 2011-09-13: 4 mg via INTRAVENOUS
  Filled 2011-09-13: qty 2

## 2011-09-13 MED ORDER — HYDROMORPHONE HCL PF 1 MG/ML IJ SOLN
1.0000 mg | Freq: Once | INTRAMUSCULAR | Status: AC
  Administered 2011-09-13: 1 mg via INTRAVENOUS
  Filled 2011-09-13: qty 1

## 2011-09-13 MED ORDER — HYDROCODONE-ACETAMINOPHEN 5-325 MG PO TABS: ORAL_TABLET | ORAL | Status: DC

## 2011-09-13 MED ORDER — IOHEXOL 300 MG/ML  SOLN
100.0000 mL | Freq: Once | INTRAMUSCULAR | Status: AC | PRN
  Administered 2011-09-13: 100 mL via INTRAVENOUS

## 2011-09-13 MED ORDER — ONDANSETRON 8 MG PO TBDP: 8.0000 mg | ORAL_TABLET | Freq: Two times a day (BID) | ORAL | Status: AC | PRN

## 2011-09-13 NOTE — Discharge Instructions (Signed)
Abdominal Pain Abdominal pain can be caused by many things. Your caregiver decides the seriousness of your pain by an examination and possibly blood tests and X-rays. Many cases can be observed and treated at home. Most abdominal pain is not caused by a disease and will probably improve without treatment. However, in many cases, more time must pass before a clear cause of the pain can be found. Before that point, it may not be known if you need more testing, or if hospitalization or surgery is needed. HOME CARE INSTRUCTIONS   Do not take laxatives unless directed by your caregiver.   Take pain medicine only as directed by your caregiver.   Only take over-the-counter or prescription medicines for pain, discomfort, or fever as directed by your caregiver.   Try a clear liquid diet (broth, tea, or water) for as long as directed by your caregiver. Slowly move to a bland diet as tolerated.  SEEK IMMEDIATE MEDICAL CARE IF:   The pain does not go away.   You have a fever.   You keep throwing up (vomiting).   The pain is felt only in portions of the abdomen. Pain in the right side could possibly be appendicitis. In an adult, pain in the left lower portion of the abdomen could be colitis or diverticulitis.   You pass bloody or black tarry stools.  MAKE SURE YOU:   Understand these instructions.   Will watch your condition.   Will get help right away if you are not doing well or get worse.  Document Released: 03/21/2005 Document Revised: 05/31/2011 Document Reviewed: 01/28/2008 [facility] Patient Information 2012 [place], [place].    B.R.A.T. Diet Your doctor has recommended the B.R.A.T. diet for you or your child until the condition improves. This is often used to help control diarrhea and vomiting symptoms. If you or your child can tolerate clear liquids, you may have:  Bananas.   Rice.   Applesauce.   Toast (and other simple starches such as crackers, potatoes, noodles).  Be sure to  avoid dairy products, meats, and fatty foods until symptoms are better. Fruit juices such as apple, grape, and prune juice can make diarrhea worse. Avoid these. Continue this diet for 2 days or as instructed by your caregiver. Document Released: 06/11/2005 Document Revised: 05/31/2011 Document Reviewed: 11/28/2006 [facility] Patient Information 2012 [place], [place].    Narcotic and benzodiazepine use may cause drowsiness, slowed breathing or dependence.  Please use with caution and do not drive, operate machinery or watch young children alone while taking them.  Taking combinations of these medications or drinking alcohol will potentiate these effects.

## 2011-09-13 NOTE — ED Provider Notes (Signed)
History     CSN: [id number]  Arrival date & time 09/13/11  4782   First MD Initiated Contact with Patient 09/13/11 1045      Chief Complaint  Patient presents with  . Abdominal Pain    (Consider location/radiation/quality/duration/timing/severity/associated sxs/prior treatment) HPI Comments: Pt with 1-2 days of first nausea and lots of belching, abdominal swelling and bloated sensation, vomited once today.  She tried laxative yesterday and had a BM which was normal, did not help symptoms.  Some subjective fevers and chills.  No sweats, no rash, no CP, SOB, cough, URI symptoms.  Pt has sig h/o hysterectomy, appy, choley in the past.  She took some cold medication last night without much relief.  Ate a little apple sauce yesterday and kept it down.  Last normal meal was 2 days ago. Felt similar when diagnosed with pancreatitis due to gallstones.  Pt does not drink alcohol, former smoker.  PCP is a NP with [facility].    Patient is a 66 y.o. female presenting with abdominal pain. The history is provided by the patient and a relative.  Abdominal Pain The primary symptoms of the illness include abdominal pain, fever, nausea and vomiting. The primary symptoms of the illness do not include shortness of breath, diarrhea or dysuria.  Additional symptoms associated with the illness include chills. Symptoms associated with the illness do not include frequency or back pain.    Past Medical History  Diagnosis Date  . Myocardial infarction   . Respiratory failure      multifactorial secondary to congestive heart failure, morbid obesity, decreased abdominal  compliance in the setting of pancreatitis.  . Diastolic heart failure   . Leukocytosis     secondary to steroid and pancreatitis.  . DM (diabetes mellitus)   . HTN (hypertension)   . Seizure disorder   . Hyperlipidemia   . Back pain   . Obstructive sleep apnea   . Empty sella     on MRI in 2009.  . Diverticulitis   .  Abnormal liver function      in the past.  . Headache   . Pneumonia     Past Surgical History  Procedure Date  . Cholecystectomy   . Abdominal hysterectomy   . Tubal ligation   . Tonsillectomy   . Appendectomy   . Cardiac catheterization     jan 2012    Family History  Problem Relation Age of Onset  . Allergies Father   . Heart disease Father   . Heart disease Mother     History  Substance Use Topics  . Smoking status: Former Smoker -- 0.5 packs/day for 25 years    Types: Cigarettes    Quit date: 06/25/1992  . Smokeless tobacco: Never Used  . Alcohol Use: No    OB History    Grav Para Term Preterm Abortions TAB SAB Ect Mult Living                  Review of Systems  Constitutional: Positive for fever, chills and appetite change.  HENT: Negative for congestion and rhinorrhea.   Respiratory: Negative for cough and shortness of breath.   Cardiovascular: Negative for chest pain.  Gastrointestinal: Positive for nausea, vomiting and abdominal pain. Negative for diarrhea.  Genitourinary: Negative for dysuria, frequency and flank pain.  Musculoskeletal: Negative for back pain.  Neurological: Negative for syncope, light-headedness and headaches.  All other systems reviewed and are negative.    Allergies  Codeine; Penicillins; and Sulfa antibiotics  Home Medications   Current Outpatient Rx  Name Route Sig Dispense Refill  . ALBUTEROL SULFATE HFA 108 (90 BASE) MCG/ACT IN AERS Inhalation Inhale 2 puffs into the lungs every 4 (four) hours as needed for wheezing or shortness of breath. 1 Inhaler 5  . ARIPIPRAZOLE 5 MG PO TABS Oral Take 5 mg by mouth daily.      . ASPIRIN 81 MG PO TABS Oral Take 81 mg by mouth daily.      . ATORVASTATIN CALCIUM 10 MG PO TABS Oral Take 10 mg by mouth daily.    . CHOLESTYRAMINE 4 GM/DOSE PO POWD Oral Take 4 g by mouth 2 (two) times daily with a meal.    . FUROSEMIDE 40 MG PO TABS Oral Take 40 mg by mouth 2 (two) times daily.      Marland Kitchen  GABAPENTIN 300 MG PO CAPS Oral Take 300 mg by mouth 3 (three) times daily.      . INSULIN DETEMIR 100 UNIT/ML [place] SOLN Subcutaneous Inject 34 Units into the skin daily.     . ISOSORBIDE MONONITRATE ER 30 MG PO TB24 Oral Take 30 mg by mouth daily.      Marland Kitchen LAMOTRIGINE 100 MG PO TABS Oral Take 100 mg by mouth 2 (two) times daily.      Marland Kitchen MAGNESIUM CITRATE 1.745 GM/30ML PO SOLN Oral Take 1 Bottle by mouth once. laxative    . METFORMIN HCL 1000 MG PO TABS Oral Take 1,000 mg by mouth 2 (two) times daily with a meal.    . ONE-DAILY MULTI VITAMINS PO TABS Oral Take 1 tablet by mouth daily.      . NEBIVOLOL HCL 10 MG PO TABS Oral Take 10 mg by mouth daily.      Marland Kitchen PANCRELIPASE (LIP-PROT-AMYL) 25000 UNITS PO CPEP  Take 2 tablets by mouth with each meal and 1 tablet by mouth at a snack time.     Marland Kitchen POTASSIUM CHLORIDE CRYS ER 20 MEQ PO TBCR Oral Take 20 mEq by mouth daily.      . SERTRALINE HCL 50 MG PO TABS Oral Take 100 mg by mouth daily.     Marland Kitchen VALSARTAN 320 MG PO TABS Oral Take 320 mg by mouth daily.    Marland Kitchen HYDROCODONE-ACETAMINOPHEN 5-325 MG PO TABS  1-2 tablets po q 6 hours prn moderate to severe pain 15 tablet 0  . ONDANSETRON 8 MG PO TBDP Oral Take 1 tablet (8 mg total) by mouth every 12 (twelve) hours as needed for nausea. 20 tablet 0    BP 90/40  Pulse 65  Temp(Src) 97.9 F (36.6 C) (Oral)  Resp 13  SpO2 96%  Physical Exam  Nursing note and vitals reviewed. Constitutional: She appears well-developed and well-nourished.  Non-toxic appearance. She does not have a sickly appearance. She appears ill. No distress.  HENT:  Head: Normocephalic and atraumatic.  Mouth/Throat: Uvula is midline. Mucous membranes are not pale and dry.  Neck: Trachea normal and normal range of motion. Neck supple.  Cardiovascular: Normal rate, regular rhythm and normal pulses.   No murmur heard. Pulmonary/Chest: No accessory muscle usage. No respiratory distress. She has no decreased breath sounds. She has no wheezes.    Abdominal: Bowel sounds are normal. She exhibits distension. There is tenderness. There is no rebound and no CVA tenderness.  Neurological: She is alert. She has normal strength. GCS eye subscore is 4. GCS verbal subscore is 5. GCS motor subscore is 6.  Skin: Skin is warm, dry and intact. No rash noted. No pallor.    ED Course  Procedures (including critical care time)  Labs Reviewed  CBC - Abnormal; Notable for the following:    Hemoglobin 11.7 (*)    MCV 73.0 (*)    MCH 23.2 (*)    RDW 15.6 (*)    All other components within normal limits  COMPREHENSIVE METABOLIC PANEL - Abnormal; Notable for the following:    Sodium 134 (*)    Total Bilirubin 0.2 (*)    GFR calc non Af Amer 87 (*)    All other components within normal limits  DIFFERENTIAL  LIPASE, BLOOD  URINALYSIS, ROUTINE W REFLEX MICROSCOPIC   Ct Abdomen Pelvis W Contrast  09/13/2011  *RADIOLOGY REPORT*  Clinical Data: Diffuse abdominal pain with constipation.  History of pancreatitis, cholecystectomy, total hysterectomy and appendectomy.  CT ABDOMEN AND PELVIS WITH CONTRAST  Technique:  Multidetector CT imaging of the abdomen and pelvis was performed following the standard protocol during bolus administration of intravenous contrast.  Contrast:  100 ml Omnipaque-300 intravenously.  Comparison: Abdominal pelvic CT 04/12/2011.  Findings: Again demonstrated is a large bilobed pseudocyst replacing the majority of the pancreatic head and body.  This measures 8.8 x 5.2 cm transverse on image 25 (previously 8.8 x 4.7 cm remeasured). An adjacent but separate and slightly more complex pseudocyst within the pancreatic tail measures 5.8 x 3.6 cm on image 24 (previously 5.3 x 3.6 cm).  No recurrent surrounding inflammatory changes are identified.  The splenic, portal and superior mesenteric veins remain patent.  The lung bases are clear.  The liver appears normal.  There is no significant biliary dilatation status post cholecystectomy.  The  spleen, adrenal glands and kidneys appear normal.  There are scattered colonic diverticular changes without surrounding inflammation.  There is no evidence of bowel obstruction or extraluminal fluid collection.  There is no evidence of pelvic mass status post hysterectomy.  The urinary bladder is mildly distended without wall thickening or surrounding inflammation.  Lower lumbar spine degenerative changes are again noted.  IMPRESSION:  1.  There are persistent and slightly larger pseudocysts replacing the majority of the pancreas.  These may contribute to abdominal pain and pancreatic insufficiency. 2.  No recurrent surrounding inflammatory change or evidence of venous thrombosis. 3.  No evidence of bowel obstruction.  Original Report Authenticated By: Gerrianne Scale, M.D.   Dg Abd Acute W/chest  09/13/2011  *RADIOLOGY REPORT*  Clinical Data: Abdominal pain.  History of seizures.  ACUTE ABDOMEN SERIES (ABDOMEN 2 VIEW & CHEST 1 VIEW)  Comparison: Chest radiographs 02/23/2011 abdominal CT 04/12/2011.  Findings: The heart size and mediastinal contours are stable. There is central airway thickening.  Right hilar prominence is stable to improved.  No confluent airspace opacity or pleural effusion is identified.  The bowel gas pattern is normal.  There is no free intraperitoneal air.  Cholecystectomy clips are noted.  Right pelvic calcification appears unchanged.  IMPRESSION: No acute cardiopulmonary or abdominal process identified.  Original Report Authenticated By: Gerrianne Scale, M.D.     1. Abdominal pain   2. Nausea and vomiting        I reviewed pt's plain films, per radiologist, unremarkable.  Plain films are not definitive for bowel obstruction, will obtain abd CT scan.     4:18 PM Pt reports feels improved, no further vomiting here.  CT shows pseduocyst, but symtpoms improved, lipase normal, WBC is normal.  VS are ok.  Will d/c home.    MDM  WBC is ok, pt with subj fevers, chills, N/V  with abd pain, generalized.  IVF's, IV antiemetics and analgesics ordered.  Pt with prior surgeries in the past, some suspicion for SBO possibly partial since she did have a BM after laxative use yesterday.  No fever here.          Gavin Pound. Angie Hogg, MD 09/13/11 1624

## 2011-09-13 NOTE — ED Notes (Addendum)
famly reports that [name] should be on oxygen countinously, but does not wear her oxygen. Oxygen level dropped and rn put [name] on [place] 2 L  [name] was suppose to go see her pulmonary dr, but missed the appt due to being in ED

## 2011-09-13 NOTE — ED Notes (Signed)
Pt alert and oriented x4. Respirations even and unlabored, bilateral symmetrical rise and fall of chest. Skin warm and dry. In no acute distress. Denies needs.   

## 2011-09-19 ENCOUNTER — Other Ambulatory Visit: Payer: Self-pay | Admitting: Gastroenterology

## 2011-09-19 ENCOUNTER — Other Ambulatory Visit (HOSPITAL_COMMUNITY): Payer: Self-pay | Admitting: Gastroenterology

## 2011-09-19 DIAGNOSIS — R110 Nausea: Secondary | ICD-10-CM

## 2011-09-19 DIAGNOSIS — R142 Eructation: Secondary | ICD-10-CM

## 2011-09-26 ENCOUNTER — Ambulatory Visit
Admission: RE | Admit: 2011-09-26 | Discharge: 2011-09-26 | Disposition: A | Discharge status: Home / Self Care | Payer: Medicare HMO | Source: Ambulatory Visit | Attending: Gastroenterology | Admitting: Gastroenterology

## 2011-09-26 ENCOUNTER — Other Ambulatory Visit: Payer: Self-pay | Admitting: Gastroenterology

## 2011-09-26 DIAGNOSIS — R110 Nausea: Secondary | ICD-10-CM

## 2011-10-01 ENCOUNTER — Other Ambulatory Visit (HOSPITAL_COMMUNITY): Payer: Medicare HMO

## 2011-10-10 ENCOUNTER — Emergency Department (HOSPITAL_COMMUNITY)
Admission: EM | Admit: 2011-10-10 | Discharge: 2011-10-10 | Disposition: A | Discharge status: Home Health | Payer: Medicare HMO | Attending: Emergency Medicine | Admitting: Emergency Medicine

## 2011-10-10 ENCOUNTER — Encounter (HOSPITAL_COMMUNITY): Payer: Self-pay

## 2011-10-10 DIAGNOSIS — K625 Hemorrhage of anus and rectum: Secondary | ICD-10-CM | POA: Insufficient documentation

## 2011-10-10 DIAGNOSIS — I252 Old myocardial infarction: Secondary | ICD-10-CM | POA: Insufficient documentation

## 2011-10-10 DIAGNOSIS — G4733 Obstructive sleep apnea (adult) (pediatric): Secondary | ICD-10-CM | POA: Insufficient documentation

## 2011-10-10 DIAGNOSIS — E785 Hyperlipidemia, unspecified: Secondary | ICD-10-CM | POA: Insufficient documentation

## 2011-10-10 DIAGNOSIS — E119 Type 2 diabetes mellitus without complications: Secondary | ICD-10-CM | POA: Insufficient documentation

## 2011-10-10 DIAGNOSIS — Z79899 Other long term (current) drug therapy: Secondary | ICD-10-CM | POA: Insufficient documentation

## 2011-10-10 DIAGNOSIS — I10 Essential (primary) hypertension: Secondary | ICD-10-CM | POA: Insufficient documentation

## 2011-10-10 LAB — BASIC METABOLIC PANEL
BUN: 10 mg/dL (ref 6–23)
CO2: 30 mEq/L (ref 19–32)
Calcium: 9 mg/dL (ref 8.4–10.5)
Chloride: 98 mEq/L (ref 96–112)
Creatinine, Ser: 0.86 mg/dL (ref 0.50–1.10)
GFR calc Af Amer: 82 mL/min — ABNORMAL LOW (ref >90)
GFR calc non Af Amer: 70 mL/min — ABNORMAL LOW (ref >90)
Glucose, Bld: 127 mg/dL — ABNORMAL HIGH (ref 70–99)
Potassium: 4 mEq/L (ref 3.5–5.1)
Sodium: 136 mEq/L (ref 135–145)

## 2011-10-10 LAB — OCCULT BLOOD, POC DEVICE: Fecal Occult Bld: NEGATIVE

## 2011-10-10 LAB — CBC
HCT: 35 % — ABNORMAL LOW (ref 36.0–46.0)
Hemoglobin: 10.9 g/dL — ABNORMAL LOW (ref 12.0–15.0)
MCH: 22.9 pg — ABNORMAL LOW (ref 26.0–34.0)
MCHC: 31.1 g/dL (ref 30.0–36.0)
MCV: 73.5 fL — ABNORMAL LOW (ref 78.0–100.0)
Platelets: 290 10*3/uL (ref 150–400)
RBC: 4.76 MIL/uL (ref 3.87–5.11)
RDW: 15.9 % — ABNORMAL HIGH (ref 11.5–15.5)
WBC: 5.1 10*3/uL (ref 4.0–10.5)

## 2011-10-10 LAB — TYPE AND SCREEN
ABO/RH(D): B POS
Antibody Screen: NEGATIVE

## 2011-10-10 LAB — ABO/RH: ABO/RH(D): B POS

## 2011-10-10 MED ORDER — SODIUM CHLORIDE 0.9 % IV BOLUS (SEPSIS)
1000.0000 mL | Freq: Once | INTRAVENOUS | Status: AC
  Administered 2011-10-10: 1000 mL via INTRAVENOUS

## 2011-10-10 MED ORDER — ONDANSETRON HCL 4 MG/2ML IJ SOLN
4.0000 mg | Freq: Once | INTRAMUSCULAR | Status: AC
  Administered 2011-10-10: 4 mg via INTRAVENOUS
  Filled 2011-10-10: qty 2

## 2011-10-10 MED ORDER — MORPHINE SULFATE 4 MG/ML IJ SOLN
8.0000 mg | Freq: Once | INTRAMUSCULAR | Status: AC
  Administered 2011-10-10: 4 mg via INTRAVENOUS
  Filled 2011-10-10: qty 1

## 2011-10-10 NOTE — ED Notes (Signed)
Patient reports that she began having rectal bleeding at 0200 today. Patient states she had bright red blood in the toilet and saw 2 small clots.

## 2011-10-10 NOTE — ED Provider Notes (Signed)
History    66s-year-old female with rectal bleeding. Prior rectal blood and some small clots. Onset earlier this morning. 2 episodes. As any abdominal or back pain. No chest pain or shortness of breath. No dizziness, lightheadedness or palpitations. Patient takes 81 mg of aspirin daily, otherwise no blood thinning medication. Patient states that she had an episode of GI bleeding years ago which resolved. As part of this workup had a colonoscopy, which she thinks was "ok. They just cut out a polyp."   CSN: [id number]  Arrival date & time 10/10/11  1104   First MD Initiated Contact with Patient 10/10/11 1141      Chief Complaint  Patient presents with  . Rectal Bleeding    (Consider location/radiation/quality/duration/timing/severity/associated sxs/prior treatment) HPI  Past Medical History  Diagnosis Date  . Myocardial infarction   . Respiratory failure      multifactorial secondary to congestive heart failure, morbid obesity, decreased abdominal  compliance in the setting of pancreatitis.  . Diastolic heart failure   . Leukocytosis     secondary to steroid and pancreatitis.  . DM (diabetes mellitus)   . HTN (hypertension)   . Seizure disorder   . Hyperlipidemia   . Back pain   . Obstructive sleep apnea   . Empty sella     on MRI in 2009.  . Diverticulitis   . Abnormal liver function      in the past.  . Headache   . Pneumonia     Past Surgical History  Procedure Date  . Cholecystectomy   . Abdominal hysterectomy   . Tubal ligation   . Tonsillectomy   . Appendectomy   . Cardiac catheterization     jan 2012  . Rectal polypectomy     Family History  Problem Relation Age of Onset  . Allergies Father   . Heart disease Father   . Heart failure Father   . Heart disease Mother     History  Substance Use Topics  . Smoking status: Former Smoker -- 0.5 packs/day for 25 years    Types: Cigarettes    Quit date: 06/25/1992  . Smokeless tobacco: Never Used  .  Alcohol Use: No    OB History    Grav Para Term Preterm Abortions TAB SAB Ect Mult Living                  Review of Systems   Review of symptoms negative unless otherwise noted in HPI.   Allergies  Codeine; Penicillins; and Sulfa antibiotics  Home Medications   Current Outpatient Rx  Name Route Sig Dispense Refill  . ALBUTEROL SULFATE HFA 108 (90 BASE) MCG/ACT IN AERS Inhalation Inhale 2 puffs into the lungs every 4 (four) hours as needed for wheezing or shortness of breath. 1 Inhaler 5  . ARIPIPRAZOLE 5 MG PO TABS Oral Take 5 mg by mouth daily.      . ASPIRIN 81 MG PO TABS Oral Take 81 mg by mouth daily.      . ATORVASTATIN CALCIUM 10 MG PO TABS Oral Take 10 mg by mouth daily.    . CHOLESTYRAMINE 4 GM/DOSE PO POWD Oral Take 4 g by mouth 2 (two) times daily with a meal.    . ESOMEPRAZOLE MAGNESIUM 40 MG PO CPDR Oral Take 40 mg by mouth daily before breakfast.    . FERROUS GLUCONATE 324 (38 FE) MG PO TABS Oral Take 324 mg by mouth daily with breakfast.    . GABAPENTIN  300 MG PO CAPS Oral Take 300 mg by mouth 3 (three) times daily.      Marland Kitchen HYDROCODONE-ACETAMINOPHEN 5-325 MG PO TABS  1-2 tablets po q 6 hours prn moderate to severe pain 15 tablet 0  . INSULIN DETEMIR 100 UNIT/ML [place] SOLN Subcutaneous Inject 34 Units into the skin daily.     . ISOSORBIDE MONONITRATE ER 30 MG PO TB24 Oral Take 30 mg by mouth daily.      Marland Kitchen LAMOTRIGINE 100 MG PO TABS Oral Take 100 mg by mouth 2 (two) times daily.      Marland Kitchen LIRAGLUTIDE 18 MG/3ML [place] SOLN Subcutaneous Inject 1.2 mg into the skin daily.    Marland Kitchen METFORMIN HCL 1000 MG PO TABS Oral Take 1,000 mg by mouth 2 (two) times daily with a meal.    . ONE-DAILY MULTI VITAMINS PO TABS Oral Take 1 tablet by mouth daily.      . NEBIVOLOL HCL 10 MG PO TABS Oral Take 10 mg by mouth daily.      . OMEGA-3-ACID ETHYL ESTERS 1 G PO CAPS Oral Take 2 g by mouth daily.    Marland Kitchen ONDANSETRON 4 MG PO TBDP Oral Take 4 mg by mouth every 12 (twelve) hours as needed. For nausea      . PANCRELIPASE (LIP-PROT-AMYL) 25000 UNITS PO CPEP  Take 2 tablets by mouth with each meal and 1 tablet by mouth at a snack time.     Marland Kitchen POTASSIUM CHLORIDE CRYS ER 20 MEQ PO TBCR Oral Take 20 mEq by mouth daily.      . SERTRALINE HCL 50 MG PO TABS Oral Take 100 mg by mouth daily.     Marland Kitchen VALSARTAN 320 MG PO TABS Oral Take 320 mg by mouth daily.    . FUROSEMIDE 40 MG PO TABS Oral Take 40 mg by mouth 2 (two) times daily.        BP 142/81  Pulse 72  Temp(Src) 98.7 F (37.1 C) (Oral)  Resp 16  SpO2 95%  Physical Exam  Nursing note and vitals reviewed. Constitutional: She appears well-developed and well-nourished. No distress.       Laying in bed. nad.  HENT:  Head: Normocephalic and atraumatic.  Eyes: Conjunctivae are normal. Right eye exhibits no discharge. Left eye exhibits no discharge.  Neck: Neck supple.  Cardiovascular: Normal rate, regular rhythm and normal heart sounds.  Exam reveals no gallop and no friction rub.   No murmur heard. Pulmonary/Chest: Effort normal and breath sounds normal. No respiratory distress.  Abdominal: Soft. She exhibits no distension. There is no tenderness.  Genitourinary:       Rectal exam with no external or palpable internal lesions. Normal tone. Soft brown stool. Heme neg.  Musculoskeletal: She exhibits no edema and no tenderness.  Neurological: She is alert.  Skin: Skin is warm and dry. She is not diaphoretic.  Psychiatric: She has a normal mood and affect. Her behavior is normal. Thought content normal.      ED Course  Procedures (including critical care time)  Labs Reviewed  CBC - Abnormal; Notable for the following:    Hemoglobin 10.9 (*)    HCT 35.0 (*)    MCV 73.5 (*)    MCH 22.9 (*)    RDW 15.9 (*)    All other components within normal limits  BASIC METABOLIC PANEL - Abnormal; Notable for the following:    Glucose, Bld 127 (*)    GFR calc non Af Amer 70 (*)  GFR calc Af Amer 82 (*)    All other components within normal limits   TYPE AND SCREEN  OCCULT BLOOD, POC DEVICE  ABO/RH  LAB REPORT - SCANNED   No results found.   1. Rectal bleeding       MDM  66 year old female with rectal bleeding. Rectal exam unremarkable. Stool is heme-negative. Patient is mildly anemic but has a history of this and is close baseline. She is not complaining of any orthostatic symptoms. She is not anticoagulated. She has not had any continued bleeding since being in the emergency room. Feel that she is appropriate for discharge at this time for outpatient followup.        Raeford Razor, MD 10/19/11 [phone]

## 2011-10-10 NOTE — ED Notes (Signed)
UJW:[id number]<YN> Expected date:10/10/11<BR> Expected time:11:38 AM<BR> Means of arrival:Ambulance<BR> Comments:<BR> Hip dislocation

## 2011-10-10 NOTE — Discharge Instructions (Signed)
Bloody Stools Bloody stools often mean that there is a problem in the digestive tract. Your caregiver may use the term "melena" to describe black, tarry, and bad smelling stools or "hematochezia" to describe red or maroon-colored stools. Blood seen in the stool can be caused by bleeding anywhere along the intestinal tract.  A black stool usually means that blood is coming from the upper part of the gastrointestinal tract (esophagus, stomach, or small bowel). Passing maroon-colored stools or bright red blood usually means that blood is coming from lower down in the large bowel or the rectum. However, sometimes massive bleeding in the stomach or small intestine can cause bright red bloody stools.  Consuming black licorice, lead, iron pills, medicines containing bismuth subsalicylate, or blueberries can also cause black stools. Your caregiver can test black stools to see if blood is present. It is important that the cause of the bleeding be found. Treatment can then be started, and the problem can be corrected. Rectal bleeding may not be serious, but you should not assume everything is okay until you know the cause.It is very important to follow up with your caregiver or a specialist in gastrointestinal problems. CAUSES  Blood in the stools can come from various underlying causes.Often, the cause is not found during your first visit. Testing is often needed to discover the cause of bleeding in the gastrointestinal tract. Causes range from simple to serious or even life-threatening.Possible causes include:  Hemorrhoids.These are veins that are full of blood (engorged) in the rectum. They cause pain, inflammation, and may bleed.   Anal fissures.These are areas of painful tearing which may bleed. They are often caused by passing hard stool.   Diverticulosis.These are pouches that form on the colon over time, with age, and may bleed significantly.   Diverticulitis.This is inflammation in areas with  diverticulosis. It can cause pain, fever, and bloody stools, although bleeding is rare.   Proctitis and colitis. These are inflamed areas of the rectum or colon. They may cause pain, fever, and bloody stools.   Polyps and cancer. Colon cancer is a leading cause of preventable cancer death.It often starts out as precancerous polyps that can be removed during a colonoscopy, preventing progression into cancer. Sometimes, polyps and cancer may cause rectal bleeding.   Gastritis and ulcers.Bleeding from the upper gastrointestinal tract (near the stomach) may travel through the intestines and produce black, sometimes tarry, often bad smelling stools. In certain cases, if the bleeding is fast enough, the stools may not be black, but red and the condition may be life-threatening.  SYMPTOMS  You may have stools that are bright red and bloody, that are normal color with blood on them, or that are dark black and tarry. In some cases, you may only have blood in the toilet bowl. Any of these cases need medical care. You may also have:  Pain at the anus or anywhere in the rectum.   Lightheadedness or feeling faint.   Extreme weakness.   Nausea or vomiting.   Fever.  DIAGNOSIS Your caregiver may use the following methods to find the cause of your bleeding:  Taking a medical history. Age is important. Older people tend to develop polyps and cancer more often. If there is anal pain and a hard, large stool associated with bleeding, a tear of the anus may be the cause. If blood drips into the toilet after a bowel movement, bleeding hemorrhoids may be the problem. The color and frequency of the bleeding are additional considerations.   In most cases, the medical history provides clues, but seldom the final answer.   A visual and finger (digital) exam. Your caregiver will inspect the anal area, looking for tears and hemorrhoids. A finger exam can provide information when there is tenderness or a growth inside. In  men, the prostate is also examined.   Endoscopy. Several types of small, long scopes (endoscopes) are used to view the colon.   In the office, your caregiver may use a rigid, or more commonly, a flexible viewing sigmoidoscope. This exam is called flexible sigmoidoscopy. It is performed in 5 to 10 minutes.   A more thorough exam is accomplished with a colonoscope. It allows your caregiver to view the entire 5 to 6 foot long colon. Medicine to help you relax (sedative) is usually given for this exam. Frequently, a bleeding lesion may be present beyond the reach of the sigmoidoscope. So, a colonoscopy may be the best exam to start with. Both exams are usually done on an outpatient basis. This means the patient does not stay overnight in the hospital or surgery center.   An upper endoscopy may be needed to examine your stomach. Sedation is used and a flexible endoscope is put in your mouth, down to your stomach.   A barium enema X-ray. This is an X-ray exam. It uses liquid barium inserted by enema into the rectum. This test alone may not identify an actual bleeding point. X-rays highlight abnormal shadows, such as those made by lumps (tumors), diverticuli, or colitis.  TREATMENT  Treatment depends on the cause of your bleeding.   For bleeding from the stomach or colon, the caregiver doing your endoscopy or colonoscopy may be able to stop the bleeding as part of the procedure.   Inflammation or infection of the colon can be treated with medicines.   Many rectal problems can be treated with creams, suppositories, or warm baths.   Surgery is sometimes needed.   Blood transfusions are sometimes needed if you have lost a lot of blood.   For any bleeding problem, let your caregiver know if you take aspirin or other blood thinners regularly.  HOME CARE INSTRUCTIONS   Take any medicines exactly as prescribed.   Keep your stools soft by eating a diet high in fiber. Prunes (1 to 3 a day) work well for  many people.   Drink enough water and fluids to keep your urine clear or pale yellow.   Take sitz baths if advised. A sitz bath is when you sit in a bathtub with warm water for 10 to 15 minutes to soak, soothe, and cleanse the rectal area.   If enemas or suppositories are advised, be sure you know how to use them. Tell your caregiver if you have problems with this.   Monitor your bowel movements to look for signs of improvement or worsening.  SEEK MEDICAL CARE IF:   You do not improve in the time expected.   Your condition worsens after initial improvement.   You develop any new symptoms.  SEEK IMMEDIATE MEDICAL CARE IF:   You develop severe or prolonged rectal bleeding.   You vomit blood.   You feel weak or faint.   You have a fever.  MAKE SURE YOU:  Understand these instructions.   Will watch your condition.   Will get help right away if you are not doing well or get worse.  Document Released: 06/01/2002 Document Revised: 05/31/2011 Document Reviewed: 10/27/2010 [facility] Patient Information 2012 [place], [place].Rectal Bleeding  Rectal bleeding is when blood comes out of the opening of the butt (anus). Rectal bleeding may show up as bright red blood or really dark poop (stool). The poop may look dark red, maroon, or black. Rectal bleeding is often a sign that something is wrong. This needs to be checked by a doctor.  HOME CARE  Eat a diet high in fiber. This will help keep your poop soft.   Limit activitiy.   Drink enough fluids to keep your pee (urine) clear or pale yellow.   Take a warm bath to soothe any pain.   Follow up with your doctor as told.  GET HELP RIGHT AWAY IF:  You have more bleeding.   You have black or dark red poop.   You throw up (vomit) blood or it looks like coffee grounds.   You have belly (abdominal) pain or tenderness.   You have a fever.   You feel weak, sick to your stomach (nauseous), or you pass out (faint).   You have pain that  is so bad you cannot poop (bowel movement).  MAKE SURE YOU:  Understand these instructions.   Will watch your condition.   Will get help right away if you are not doing well or get worse.  Document Released: 02/21/2011 Document Revised: 05/31/2011 Document Reviewed: 02/21/2011 [facility] Patient Information 2012 [place], [place].

## 2011-10-23 DIAGNOSIS — Z87891 Personal history of nicotine dependence: Secondary | ICD-10-CM | POA: Insufficient documentation

## 2011-10-24 DIAGNOSIS — F445 Conversion disorder with seizures or convulsions: Secondary | ICD-10-CM | POA: Insufficient documentation

## 2011-10-25 ENCOUNTER — Ambulatory Visit: Payer: Medicare HMO | Admitting: Cardiovascular Disease

## 2011-10-28 ENCOUNTER — Encounter (HOSPITAL_COMMUNITY): Payer: Self-pay | Admitting: Emergency Medicine

## 2011-10-28 ENCOUNTER — Emergency Department (HOSPITAL_COMMUNITY): Payer: Medicare HMO

## 2011-10-28 ENCOUNTER — Inpatient Hospital Stay (HOSPITAL_COMMUNITY)
Admission: EM | Admit: 2011-10-28 | Discharge: 2011-10-30 | DRG: 439 | Disposition: A | Discharge status: Other Acute Inpatient Hospital | Payer: Medicare HMO | Attending: Internal Medicine | Admitting: Internal Medicine

## 2011-10-28 DIAGNOSIS — J9610 Chronic respiratory failure, unspecified whether with hypoxia or hypercapnia: Secondary | ICD-10-CM | POA: Diagnosis present

## 2011-10-28 DIAGNOSIS — I10 Essential (primary) hypertension: Secondary | ICD-10-CM

## 2011-10-28 DIAGNOSIS — K861 Other chronic pancreatitis: Secondary | ICD-10-CM

## 2011-10-28 DIAGNOSIS — E669 Obesity, unspecified: Secondary | ICD-10-CM | POA: Diagnosis present

## 2011-10-28 DIAGNOSIS — I2510 Atherosclerotic heart disease of native coronary artery without angina pectoris: Secondary | ICD-10-CM | POA: Diagnosis present

## 2011-10-28 DIAGNOSIS — E662 Morbid (severe) obesity with alveolar hypoventilation: Secondary | ICD-10-CM | POA: Diagnosis present

## 2011-10-28 DIAGNOSIS — D638 Anemia in other chronic diseases classified elsewhere: Secondary | ICD-10-CM | POA: Diagnosis present

## 2011-10-28 DIAGNOSIS — K862 Cyst of pancreas: Secondary | ICD-10-CM

## 2011-10-28 DIAGNOSIS — Z6841 Body Mass Index (BMI) 40.0 and over, adult: Secondary | ICD-10-CM

## 2011-10-28 DIAGNOSIS — I25118 Atherosclerotic heart disease of native coronary artery with other forms of angina pectoris: Secondary | ICD-10-CM

## 2011-10-28 DIAGNOSIS — E1165 Type 2 diabetes mellitus with hyperglycemia: Secondary | ICD-10-CM

## 2011-10-28 DIAGNOSIS — K863 Pseudocyst of pancreas: Secondary | ICD-10-CM

## 2011-10-28 DIAGNOSIS — I252 Old myocardial infarction: Secondary | ICD-10-CM

## 2011-10-28 DIAGNOSIS — E785 Hyperlipidemia, unspecified: Secondary | ICD-10-CM

## 2011-10-28 DIAGNOSIS — I110 Hypertensive heart disease with heart failure: Secondary | ICD-10-CM

## 2011-10-28 DIAGNOSIS — G40909 Epilepsy, unspecified, not intractable, without status epilepticus: Secondary | ICD-10-CM | POA: Diagnosis present

## 2011-10-28 DIAGNOSIS — E119 Type 2 diabetes mellitus without complications: Secondary | ICD-10-CM | POA: Diagnosis present

## 2011-10-28 LAB — URINALYSIS, ROUTINE W REFLEX MICROSCOPIC
Bilirubin Urine: NEGATIVE
Glucose, UA: NEGATIVE mg/dL
Hgb urine dipstick: NEGATIVE
Ketones, ur: NEGATIVE mg/dL
Leukocytes, UA: NEGATIVE
Nitrite: NEGATIVE
Protein, ur: NEGATIVE mg/dL
Specific Gravity, Urine: 1.013 (ref 1.005–1.030)
Urobilinogen, UA: 1 mg/dL (ref 0.0–1.0)
pH: 6.5 (ref 5.0–8.0)

## 2011-10-28 LAB — CBC
HCT: 30.7 % — ABNORMAL LOW (ref 36.0–46.0)
Hemoglobin: 10 g/dL — ABNORMAL LOW (ref 12.0–15.0)
MCH: 23 pg — ABNORMAL LOW (ref 26.0–34.0)
MCHC: 32.6 g/dL (ref 30.0–36.0)
MCV: 70.7 fL — ABNORMAL LOW (ref 78.0–100.0)
Platelets: 208 10*3/uL (ref 150–400)
RBC: 4.34 MIL/uL (ref 3.87–5.11)
RDW: 15.6 % — ABNORMAL HIGH (ref 11.5–15.5)
WBC: 10.3 10*3/uL (ref 4.0–10.5)

## 2011-10-28 LAB — COMPREHENSIVE METABOLIC PANEL
ALT: 9 U/L (ref 0–35)
AST: 11 U/L (ref 0–37)
Albumin: 2.8 g/dL — ABNORMAL LOW (ref 3.5–5.2)
Alkaline Phosphatase: 81 U/L (ref 39–117)
BUN: 12 mg/dL (ref 6–23)
CO2: 28 mEq/L (ref 19–32)
Calcium: 8.8 mg/dL (ref 8.4–10.5)
Chloride: 87 mEq/L — ABNORMAL LOW (ref 96–112)
Creatinine, Ser: 0.95 mg/dL (ref 0.50–1.10)
GFR calc Af Amer: 72 mL/min — ABNORMAL LOW (ref >90)
GFR calc non Af Amer: 62 mL/min — ABNORMAL LOW (ref >90)
Glucose, Bld: 187 mg/dL — ABNORMAL HIGH (ref 70–99)
Potassium: 3.4 mEq/L — ABNORMAL LOW (ref 3.5–5.1)
Sodium: 126 mEq/L — ABNORMAL LOW (ref 135–145)
Total Bilirubin: 0.3 mg/dL (ref 0.3–1.2)
Total Protein: 7 g/dL (ref 6.0–8.3)

## 2011-10-28 LAB — DIFFERENTIAL
Basophils Absolute: 0 10*3/uL (ref 0.0–0.1)
Basophils Relative: 0 % (ref 0–1)
Eosinophils Absolute: 0 10*3/uL (ref 0.0–0.7)
Eosinophils Relative: 0 % (ref 0–5)
Lymphocytes Relative: 13 % (ref 12–46)
Lymphs Abs: 1.3 10*3/uL (ref 0.7–4.0)
Monocytes Absolute: 0.9 10*3/uL (ref 0.1–1.0)
Monocytes Relative: 8 % (ref 3–12)
Neutro Abs: 8.1 10*3/uL — ABNORMAL HIGH (ref 1.7–7.7)
Neutrophils Relative %: 79 % — ABNORMAL HIGH (ref 43–77)

## 2011-10-28 LAB — LACTIC ACID, PLASMA: Lactic Acid, Venous: 0.9 mmol/L (ref 0.5–2.2)

## 2011-10-28 LAB — LIPASE, BLOOD: Lipase: 14 U/L (ref 11–59)

## 2011-10-28 MED ORDER — ONDANSETRON HCL 4 MG/2ML IJ SOLN
4.0000 mg | Freq: Once | INTRAMUSCULAR | Status: AC
  Administered 2011-10-28: 4 mg via INTRAVENOUS
  Filled 2011-10-28: qty 2

## 2011-10-28 MED ORDER — SODIUM CHLORIDE 0.9 % IV SOLN
Freq: Once | INTRAVENOUS | Status: AC
  Administered 2011-10-28: 22:00:00 via INTRAVENOUS

## 2011-10-28 MED ORDER — IOHEXOL 300 MG/ML  SOLN
100.0000 mL | Freq: Once | INTRAMUSCULAR | Status: AC | PRN
  Administered 2011-10-28: 100 mL via INTRAVENOUS

## 2011-10-28 MED ORDER — SODIUM CHLORIDE 0.9 % IV BOLUS (SEPSIS)
500.0000 mL | Freq: Once | INTRAVENOUS | Status: AC
  Administered 2011-10-28: 17:00:00 via INTRAVENOUS

## 2011-10-28 MED ORDER — SODIUM CHLORIDE 0.9 % IV BOLUS (SEPSIS)
1000.0000 mL | Freq: Once | INTRAVENOUS | Status: AC
  Administered 2011-10-28: 1000 mL via INTRAVENOUS

## 2011-10-28 NOTE — ED Notes (Signed)
Per EMS: Pt had "pancreas surgery" on April 30 at [facility] and yesterday she became lethargic and was c/o general malaise.  Pt is warm to the touch and is c/o upper and lower rt sided, LUQ pain.  No vomiting, diarrhea.  C/o vomiting.

## 2011-10-28 NOTE — ED Notes (Signed)
Pt presents with family-  Sister states she has had chronic pancreatic problems for 1 year.  Seen at [facility] last tuesday for ERCP- was unable to complete due to seizures during procedure-  Discharged on wednesday and has not felt well since.  Pt presents weak and abd distended.  LBM was Friday- not normal.  Pt denies N/V/D and fever at present.  Family reports pt is not herself.

## 2011-10-28 NOTE — ED Provider Notes (Signed)
History     CSN: [id number]  Arrival date & time 10/28/11  1621   First MD Initiated Contact with Patient 10/28/11 1727      Chief Complaint  Patient presents with  . Abdominal Pain    (Consider location/radiation/quality/duration/timing/severity/associated sxs/prior treatment) Patient is a 66 y.o. female presenting with abdominal pain. The history is provided by the patient and a relative.  Abdominal Pain The primary symptoms of the illness include abdominal pain.   patient here with abdominal pain x4 days since she had her ERCP and now with fever x24 hours. Denies any vomiting or diarrhea does note nausea. No staff to recovery she had seizures x3 and had to be observed overnight. She does have a history of seizures. Does note an increased cough at this time. Denies any black or bloody stools. Pain is left upper quadrant and radiates to her back and described as dull and aching. Has used medications at home without relief  Past Medical History  Diagnosis Date  . Myocardial infarction   . Respiratory failure      multifactorial secondary to congestive heart failure, morbid obesity, decreased abdominal  compliance in the setting of pancreatitis.  . Diastolic heart failure   . Leukocytosis     secondary to steroid and pancreatitis.  . DM (diabetes mellitus)   . HTN (hypertension)   . Seizure disorder   . Hyperlipidemia   . Back pain   . Obstructive sleep apnea   . Empty sella     on MRI in 2009.  . Diverticulitis   . Abnormal liver function      in the past.  . Headache   . Pneumonia     Past Surgical History  Procedure Date  . Cholecystectomy   . Abdominal hysterectomy   . Tubal ligation   . Tonsillectomy   . Appendectomy   . Cardiac catheterization     jan 2012  . Rectal polypectomy     Family History  Problem Relation Age of Onset  . Allergies Father   . Heart disease Father   . Heart failure Father   . Heart disease Mother     History  Substance Use  Topics  . Smoking status: Former Smoker -- 0.5 packs/day for 25 years    Types: Cigarettes    Quit date: 06/25/1992  . Smokeless tobacco: Never Used  . Alcohol Use: No    OB History    Grav Para Term Preterm Abortions TAB SAB Ect Mult Living                  Review of Systems  Gastrointestinal: Positive for abdominal pain.  All other systems reviewed and are negative.    Allergies  Codeine; Penicillins; and Sulfa antibiotics  Home Medications   Current Outpatient Rx  Name Route Sig Dispense Refill  . METFORMIN HCL ER 500 MG PO TB24 Oral Take 2,000 mg by mouth daily with breakfast.    . NEBIVOLOL HCL 10 MG PO TABS Oral Take 10 mg by mouth daily.      Marland Kitchen ONDANSETRON HCL 8 MG PO TABS Oral Take 8 mg by mouth 2 (two) times daily as needed. nausea    . SERTRALINE HCL 100 MG PO TABS Oral Take 200 mg by mouth daily.    . ALBUTEROL SULFATE HFA 108 (90 BASE) MCG/ACT IN AERS Inhalation Inhale 2 puffs into the lungs every 4 (four) hours as needed for wheezing or shortness of breath. 1 Inhaler 5  .  ARIPIPRAZOLE 5 MG PO TABS Oral Take 5 mg by mouth daily.      . ASPIRIN 81 MG PO TABS Oral Take 81 mg by mouth daily.      . ATORVASTATIN CALCIUM 10 MG PO TABS Oral Take 10 mg by mouth daily.    . CHOLESTYRAMINE 4 GM/DOSE PO POWD Oral Take 4 g by mouth 2 (two) times daily with a meal.    . ESOMEPRAZOLE MAGNESIUM 40 MG PO CPDR Oral Take 40 mg by mouth daily before breakfast.    . FERROUS GLUCONATE 324 (38 FE) MG PO TABS Oral Take 324 mg by mouth daily with breakfast.    . FUROSEMIDE 40 MG PO TABS Oral Take 40 mg by mouth 2 (two) times daily.      Marland Kitchen GABAPENTIN 300 MG PO CAPS Oral Take 300 mg by mouth 3 (three) times daily.      Marland Kitchen HYDROCODONE-ACETAMINOPHEN 5-325 MG PO TABS  1-2 tablets po q 6 hours prn moderate to severe pain 15 tablet 0  . INSULIN DETEMIR 100 UNIT/ML [place] SOLN Subcutaneous Inject 34 Units into the skin daily.     . ISOSORBIDE MONONITRATE ER 30 MG PO TB24 Oral Take 30 mg by mouth  daily.      Marland Kitchen LAMOTRIGINE 100 MG PO TABS Oral Take 100 mg by mouth 2 (two) times daily.      Marland Kitchen LIRAGLUTIDE 18 MG/3ML [place] SOLN Subcutaneous Inject 1.2 mg into the skin daily.    Marland Kitchen ONE-DAILY MULTI VITAMINS PO TABS Oral Take 1 tablet by mouth daily.      . OMEGA-3-ACID ETHYL ESTERS 1 G PO CAPS Oral Take 2 g by mouth daily.    Marland Kitchen PANCRELIPASE (LIP-PROT-AMYL) 25000 UNITS PO CPEP  Take 2 tablets by mouth with each meal and 1 tablet by mouth at a snack time.     Marland Kitchen POTASSIUM CHLORIDE CRYS ER 20 MEQ PO TBCR Oral Take 20 mEq by mouth daily.      Marland Kitchen VALSARTAN 320 MG PO TABS Oral Take 320 mg by mouth daily.      BP 99/44  Pulse 76  Temp(Src) 99.2 F (37.3 C) (Oral)  Resp 18  SpO2 95%  Physical Exam  Nursing note and vitals reviewed. Constitutional: She is oriented to person, place, and time. She appears well-developed and well-nourished.  Non-toxic appearance. No distress.  HENT:  Head: Normocephalic and atraumatic.  Eyes: Conjunctivae, EOM and lids are normal. Pupils are equal, round, and reactive to light.  Neck: Normal range of motion. Neck supple. No tracheal deviation present. No mass present.  Cardiovascular: Normal rate, regular rhythm and normal heart sounds.  Exam reveals no gallop.   No murmur heard. Pulmonary/Chest: Effort normal and breath sounds normal. No stridor. No respiratory distress. She has no decreased breath sounds. She has no wheezes. She has no rhonchi. She has no rales.  Abdominal: Soft. Normal appearance and bowel sounds are normal. She exhibits no distension. There is tenderness in the epigastric area. There is no rigidity, no rebound, no guarding and no CVA tenderness.  Musculoskeletal: Normal range of motion. She exhibits no edema and no tenderness.  Neurological: She is alert and oriented to person, place, and time. She has normal strength. No cranial nerve deficit or sensory deficit. GCS eye subscore is 4. GCS verbal subscore is 5. GCS motor subscore is 6.  Skin: Skin is  warm and dry. No abrasion and no rash noted.  Psychiatric: She has a normal mood and  affect. Her speech is normal and behavior is normal.    ED Course  Procedures (including critical care time)   Labs Reviewed  CBC  DIFFERENTIAL  COMPREHENSIVE METABOLIC PANEL  LIPASE, BLOOD  URINALYSIS, ROUTINE W REFLEX MICROSCOPIC  URINE CULTURE  LACTIC ACID, PLASMA  CULTURE, BLOOD (ROUTINE X 2)  CULTURE, BLOOD (ROUTINE X 2)   No results found.   No diagnosis found.    MDM  Labs and x-ray results noted. Abdominal CT results discussed with surgeon on call. He agrees to medical admission. Spoke with [facility] hospitalist and he would admit patient        Toy Baker, MD 10/28/11 2209

## 2011-10-28 NOTE — H&P (Signed)
PCP:   Bebe Liter, NP, NP  GI: [facility]ce [facility]  Chief Complaint:  Abdominal pain, nausea  HPI: This is a 66 year old female with a chronic pancreatitis approximately a year, with chronic pseudocysts. She's been followed by Dr. Dulce Sellar with [facility]. She was referred to a gastroenterologist in [facility] by Dr. Dulce Sellar where an attempt was made at an ERCP last week. This was unsuccessful, because per patient and sister, her pancreas was too inflamed. She was discharged home, she did well for the first 2 days, then developed a nausea, somnolence, abdominal pain greatest in the bilateral upper quadrants and generalized weakness. She is not eating. She was brought the ER today by her sister. The patient denies any vomiting, diarrhea, fevers, chills, burning on urination or altered mentation. CT abdomen and pelvis done, is read as infected pseudocyst. The patient was discussed with the surgeon on-call, who says this finding could be error and the patient could possibly just have air bubbles from her most recent procedure. The hospitalistist was requested to admit.  Review of Systems: positives bolded   anorexia, fever, weight loss,, vision loss, decreased hearing, hoarseness, chest pain, syncope, dyspnea on exertion, peripheral edema, balance deficits, hemoptysis, abdominal pain, melena, hematochezia, severe indigestion/heartburn, hematuria, incontinence, genital sores, muscle weakness, suspicious skin lesions, transient blindness, difficulty walking, depression, unusual weight change, abnormal bleeding, enlarged lymph nodes, angioedema, and breast masses.  Past Medical History: Past Medical History  Diagnosis Date  . Myocardial infarction   . Respiratory failure      multifactorial secondary to congestive heart failure, morbid obesity, decreased abdominal  compliance in the setting of pancreatitis.  . Diastolic heart failure   . Leukocytosis     secondary to steroid and pancreatitis.    . DM (diabetes mellitus)   . HTN (hypertension)   . Seizure disorder   . Hyperlipidemia   . Back pain   . Obstructive sleep apnea   . Empty sella     on MRI in 2009.  . Diverticulitis   . Abnormal liver function      in the past.  . Headache   . Pneumonia    Past Surgical History  Procedure Date  . Cholecystectomy   . Abdominal hysterectomy   . Tubal ligation   . Tonsillectomy   . Appendectomy   . Cardiac catheterization     jan 2012  . Rectal polypectomy     Medications: Prior to Admission medications   Medication Sig Start Date End Date Taking? Authorizing Provider  albuterol (PROAIR HFA) 108 (90 BASE) MCG/ACT inhaler Inhale 2 puffs into the lungs every 4 (four) hours as needed for wheezing or shortness of breath. 07/27/11 07/26/12 Yes Clinton D Young, MD  ARIPiprazole (ABILIFY) 5 MG tablet Take 5 mg by mouth daily.     Yes Historical Provider, MD  aspirin 81 MG tablet Take 81 mg by mouth daily.     Yes Historical Provider, MD  atorvastatin (LIPITOR) 10 MG tablet Take 10 mg by mouth daily.   Yes Historical Provider, MD  cholestyramine (QUESTRAN) 4 GM/DOSE powder Take 4 g by mouth 2 (two) times daily with a meal.   Yes Historical Provider, MD  esomeprazole (NEXIUM) 40 MG capsule Take 40 mg by mouth daily before breakfast.   Yes Historical Provider, MD  ferrous gluconate (FERGON) 324 MG tablet Take 324 mg by mouth daily with breakfast.   Yes Historical Provider, MD  furosemide (LASIX) 40 MG tablet Take 40 mg by mouth 2 (  two) times daily.     Yes Historical Provider, MD  gabapentin (NEURONTIN) 300 MG capsule Take 300 mg by mouth 3 (three) times daily.     Yes Historical Provider, MD  HYDROcodone-acetaminophen (NORCO) 5-325 MG per tablet 1-2 tablets po q 6 hours prn moderate to severe pain 09/13/11  Yes Gavin Pound. Ghim, MD  insulin detemir (LEVEMIR) 100 UNIT/ML injection Inject 28 Units into the skin daily.    Yes Historical Provider, MD  isosorbide mononitrate (IMDUR) 30 MG 24 hr  tablet Take 30 mg by mouth daily.     Yes Historical Provider, MD  lamoTRIgine (LAMICTAL) 100 MG tablet Take 100 mg by mouth 2 (two) times daily.     Yes Historical Provider, MD  Liraglutide (VICTOZA) 18 MG/3ML SOLN Inject 1.2 mg into the skin daily.   Yes Historical Provider, MD  metFORMIN (GLUCOPHAGE-XR) 500 MG 24 hr tablet Take 500 mg by mouth 2 (two) times daily.    Yes Historical Provider, MD  Multiple Vitamin (MULTIVITAMIN) tablet Take 1 tablet by mouth daily.     Yes Historical Provider, MD  nebivolol (BYSTOLIC) 10 MG tablet Take 10 mg by mouth daily.     Yes Historical Provider, MD  omega-3 acid ethyl esters (LOVAZA) 1 G capsule Take 2 g by mouth daily.   Yes Historical Provider, MD  ondansetron (ZOFRAN) 8 MG tablet Take 8 mg by mouth 2 (two) times daily as needed. nausea   Yes Historical Provider, MD  potassium chloride SA (K-DUR,KLOR-CON) 20 MEQ tablet Take 20 mEq by mouth daily.     Yes Historical Provider, MD  sertraline (ZOLOFT) 100 MG tablet Take 200 mg by mouth daily.   Yes Historical Provider, MD  valsartan (DIOVAN) 320 MG tablet Take 320 mg by mouth daily.   Yes Historical Provider, MD    Allergies:   Allergies  Allergen Reactions  . Codeine     Headache and "just feel off"  . Penicillins Hives  . Sulfa Antibiotics Hives    Social History:  reports that she quit smoking about 19 years ago. Her smoking use included Cigarettes. She has a 12.5 pack-year smoking history. She has never used smokeless tobacco. She reports that she does not drink alcohol or use illicit drugs. uses a cane, lives with her sister, on nocturnal oxygen  Family History: Family History  Problem Relation Age of Onset  . Allergies Father   . Heart disease Father   . Heart failure Father   . Heart disease Mother     Physical Exam: Filed Vitals:   10/28/11 1915 10/28/11 1930 10/28/11 1944 10/28/11 2232  BP: 148/75 127/59 127/59 98/51  Pulse: 75 80 79   Temp:   98.6 F (37 C) 98.3 F (36.8 C)    TempSrc:   Oral Oral  Resp: 18 18 16 18   SpO2: 94% 95% 95% 98%    General:  Alert and oriented times three, well developed and nourished, no acute distress Eyes: PERRLA, pink conjunctiva, no scleral icterus ENT: Moist oral mucosa, neck supple, no thyromegaly Lungs: clear to ascultation, no wheeze, no crackles, no use of accessory muscles Cardiovascular: regular rate and rhythm, no regurgitation, no gallops, no murmurs. No carotid bruits, no JVD Abdomen: soft, positive BS, positive tenderness to palpitation greatest on the right and left upper quadrant, distended, no organomegaly, not an acute abdomen GU: not examined Neuro: CN II - XII grossly intact, sensation intact Musculoskeletal: strength 5/5 all extremities, no clubbing, cyanosis or edema Skin:  no rash, no subcutaneous crepitation, no decubitus Psych: appropriate patient   Labs on Admission:   [facility] 10/28/11 1734  NA 126*  K 3.4*  CL 87*  CO2 28  GLUCOSE 187*  BUN 12  CREATININE 0.95  CALCIUM 8.8  MG --  PHOS --    Basename 10/28/11 1734  AST 11  ALT 9  ALKPHOS 81  BILITOT 0.3  PROT 7.0  ALBUMIN 2.8*    Basename 10/28/11 1734  LIPASE 14  AMYLASE --    Basename 10/28/11 1734  WBC 10.3  NEUTROABS 8.1*  HGB 10.0*  HCT 30.7*  MCV 70.7*  PLT 208   No results found for this basename: CKTOTAL:3,CKMB:3,CKMBINDEX:3,TROPONINI:3 in the last 72 hours No components found with this basename: POCBNP:3 No results found for this basename: DDIMER:2 in the last 72 hours No results found for this basename: HGBA1C:2 in the last 72 hours No results found for this basename: CHOL:2,HDL:2,LDLCALC:2,TRIG:2,CHOLHDL:2,LDLDIRECT:2 in the last 72 hours No results found for this basename: TSH,T4TOTAL,FREET3,T3FREE,THYROIDAB in the last 72 hours No results found for this basename: VITAMINB12:2,FOLATE:2,FERRITIN:2,TIBC:2,IRON:2,RETICCTPCT:2 in the last 72 hours  Micro Results: No results found for this or any previous visit  (from the past 240 hour(s)). Results for [name], [name] (MRN [id number]) as of 10/28/2011 22:47  Ref. Range 10/28/2011 18:14  Color, Urine Latest Range: YELLOW  YELLOW  APPearance Latest Range: CLEAR  CLEAR  Specific Gravity, Urine Latest Range: 1.005-1.030  1.013  pH Latest Range: 5.0-8.0  6.5  Glucose, UA Latest Range: NEGATIVE mg/dL NEGATIVE  Bilirubin Urine Latest Range: NEGATIVE  NEGATIVE  Ketones, ur Latest Range: NEGATIVE mg/dL NEGATIVE  Protein Latest Range: NEGATIVE mg/dL NEGATIVE  Urobilinogen, UA Latest Range: 0.0-1.0 mg/dL 1.0  Nitrite Latest Range: NEGATIVE  NEGATIVE  Leukocytes, UA Latest Range: NEGATIVE  NEGATIVE  Hgb urine dipstick Latest Range: NEGATIVE  NEGATIVE    Radiological Exams on Admission: Ct Abdomen Pelvis W Contrast  10/28/2011  *RADIOLOGY REPORT*  Clinical Data: Abdominal pain. Fever.  History of pancreatic pseudocysts.  CT ABDOMEN AND PELVIS WITH CONTRAST  Technique:  Multidetector CT imaging of the abdomen and pelvis was performed following the standard protocol during bolus administration of intravenous contrast.  Contrast: OMNIPAQUE IOHEXOL 300 MG/ML  SOLN  Comparison: CT scan 09/13/2011.  Findings: The lung bases demonstrate a small effusions and bibasilar atelectasis.  The liver is unremarkable and stable.  No focal lesions or intrahepatic biliary dilatation.  The spleen is normal in size.  No focal lesions.  The adrenal glands and kidneys are unremarkable and stable.  There are large pancreatic pseudocysts.  The largest cyst is in the head and body region and measures 9.5 x 5.2 cm.  It previously measured 8.8 x 5.2 cm.  The smaller pseudocyst in the tail region measures 6.8 x 4.2 cm and previously measured 5.8 x 3.5 cm.  The margins  are thickened enhancing and there is inflammation around the pseudocysts.  This was not present on the prior study.  There are also dots of air in the pseudocysts.  These findings are worrisome for infected pseudocysts.  The  pancreatic tissue is compressed but there does appears to be some enhancing pancreatic tissue.  The common bile duct is normal in caliber.  There are borderline enlarged peripancreatic lymph nodes which are likely hyperplastic/inflammatory.  The portal and splenic veins are patent.  The stomach, duodenum, small bowel and colon are unremarkable. There is advanced diverticulosis of the descending and sigmoid colon but no findings  for acute diverticulitis.  There is a Foley catheter in the bladder.  The uterus is absent. No pelvic mass or adenopathy.  The ovaries are still present and appear normal.  No significant free pelvic fluid collections.  IMPRESSION: Findings worrisome for infected pancreatic pseudocysts.  Original Report Authenticated By: P. Loralie Champagne, M.D.   Dg Chest Port 1 View  10/28/2011  *RADIOLOGY REPORT*  Clinical Data: Chest pain.  PORTABLE CHEST - 1 VIEW  Comparison: 02/23/2011.  Findings: The heart is mildly enlarged but stable.  The mediastinal and hilar contours are prominent but unchanged.  There are low lung volumes with vascular crowding and bibasilar atelectasis.  Mild vascular congestion without overt pulmonary edema.  The bony thorax is intact.  IMPRESSION: Low lung volumes with vascular crowding and atelectasis. Mild vascular congestion without overt pulmonary edema.  Original Report Authenticated By: P. Loralie Champagne, M.D.    Assessment/Plan Present on Admission:  .Infected pseudocyst of pancreas Chronic pancreatitis Admit to telemetry N.p.o., IV fluid hydration, amylase and lipase levels in a.m. Consults Dr. Dulce Sellar with [facility] gastroenterology IV antibiotics  Consult interventional radiology for possible aspiration of infected pseudocysts if thought necessary by GI  .Obesity hypoventilation syndrome Coronary artery disease Diabetes mellitus Hypertension Obstructive sleep apnea History of seizures Dyslipidemia Chronic respiratory failure with nocturnal  oxygen Obesity Stable resume home medications with exception of metformin Seizure precautions  Full code DVT prophylaxis Team 2/ Hollister Wessler 10/28/2011, 10:45 PM

## 2011-10-28 NOTE — ED Notes (Signed)
MD at bedside. 

## 2011-10-28 NOTE — ED Notes (Signed)
[name]- sister contact information [phone]

## 2011-10-29 ENCOUNTER — Encounter (HOSPITAL_COMMUNITY): Payer: Self-pay | Admitting: Urology

## 2011-10-29 DIAGNOSIS — I10 Essential (primary) hypertension: Secondary | ICD-10-CM

## 2011-10-29 DIAGNOSIS — E1165 Type 2 diabetes mellitus with hyperglycemia: Secondary | ICD-10-CM

## 2011-10-29 DIAGNOSIS — K862 Cyst of pancreas: Secondary | ICD-10-CM

## 2011-10-29 DIAGNOSIS — K863 Pseudocyst of pancreas: Secondary | ICD-10-CM

## 2011-10-29 DIAGNOSIS — K861 Other chronic pancreatitis: Secondary | ICD-10-CM

## 2011-10-29 LAB — URINE CULTURE
Colony Count: NO GROWTH
Culture  Setup Time: 201305060233
Culture: NO GROWTH

## 2011-10-29 LAB — COMPREHENSIVE METABOLIC PANEL
ALT: 8 U/L (ref 0–35)
AST: 10 U/L (ref 0–37)
Albumin: 2.6 g/dL — ABNORMAL LOW (ref 3.5–5.2)
Alkaline Phosphatase: 78 U/L (ref 39–117)
BUN: 7 mg/dL (ref 6–23)
CO2: 26 mEq/L (ref 19–32)
Calcium: 8.6 mg/dL (ref 8.4–10.5)
Chloride: 93 mEq/L — ABNORMAL LOW (ref 96–112)
Creatinine, Ser: 0.74 mg/dL (ref 0.50–1.10)
GFR calc Af Amer: >90 mL/min (ref >90)
GFR calc non Af Amer: 89 mL/min — ABNORMAL LOW (ref >90)
Glucose, Bld: 188 mg/dL — ABNORMAL HIGH (ref 70–99)
Potassium: 3.5 mEq/L (ref 3.5–5.1)
Sodium: 131 mEq/L — ABNORMAL LOW (ref 135–145)
Total Bilirubin: 0.2 mg/dL — ABNORMAL LOW (ref 0.3–1.2)
Total Protein: 6.6 g/dL (ref 6.0–8.3)

## 2011-10-29 LAB — CBC
HCT: 29.2 % — ABNORMAL LOW (ref 36.0–46.0)
Hemoglobin: 9.4 g/dL — ABNORMAL LOW (ref 12.0–15.0)
MCH: 22.8 pg — ABNORMAL LOW (ref 26.0–34.0)
MCHC: 32.2 g/dL (ref 30.0–36.0)
MCV: 70.7 fL — ABNORMAL LOW (ref 78.0–100.0)
Platelets: 215 10*3/uL (ref 150–400)
RBC: 4.13 MIL/uL (ref 3.87–5.11)
RDW: 15.8 % — ABNORMAL HIGH (ref 11.5–15.5)
WBC: 8.9 10*3/uL (ref 4.0–10.5)

## 2011-10-29 LAB — GLUCOSE, CAPILLARY: Glucose-Capillary: 200 mg/dL — ABNORMAL HIGH (ref 70–99)

## 2011-10-29 LAB — PROTIME-INR
INR: 1.18 (ref 0.00–1.49)
Prothrombin Time: 15.3 seconds — ABNORMAL HIGH (ref 11.6–15.2)

## 2011-10-29 LAB — AMYLASE: Amylase: 20 U/L (ref 0–105)

## 2011-10-29 LAB — LIPASE, BLOOD: Lipase: 15 U/L (ref 11–59)

## 2011-10-29 MED ORDER — LEVOFLOXACIN IN D5W 750 MG/150ML IV SOLN
750.0000 mg | Freq: Every day | INTRAVENOUS | Status: DC
  Administered 2011-10-29 (×2): 750 mg via INTRAVENOUS
  Filled 2011-10-29 (×3): qty 150

## 2011-10-29 MED ORDER — GABAPENTIN 300 MG PO CAPS
300.0000 mg | ORAL_CAPSULE | Freq: Three times a day (TID) | ORAL | Status: DC
  Administered 2011-10-29 – 2011-10-30 (×3): 300 mg via ORAL
  Filled 2011-10-29 (×6): qty 1

## 2011-10-29 MED ORDER — ONDANSETRON HCL 4 MG PO TABS: 4.0000 mg | ORAL_TABLET | Freq: Four times a day (QID) | ORAL | Status: DC | PRN

## 2011-10-29 MED ORDER — MORPHINE SULFATE 2 MG/ML IJ SOLN
2.0000 mg | INTRAMUSCULAR | Status: DC | PRN
  Administered 2011-10-29 – 2011-10-30 (×7): 2 mg via INTRAVENOUS
  Filled 2011-10-29 (×7): qty 1

## 2011-10-29 MED ORDER — ACETAMINOPHEN 325 MG PO TABS: 650.0000 mg | ORAL_TABLET | Freq: Four times a day (QID) | ORAL | Status: DC | PRN

## 2011-10-29 MED ORDER — ACETAMINOPHEN 650 MG RE SUPP: 650.0000 mg | Freq: Four times a day (QID) | RECTAL | Status: DC | PRN

## 2011-10-29 MED ORDER — HEPARIN SODIUM (PORCINE) 5000 UNIT/ML IJ SOLN
5000.0000 [IU] | Freq: Three times a day (TID) | INTRAMUSCULAR | Status: DC
  Administered 2011-10-29 – 2011-10-30 (×5): 5000 [IU] via SUBCUTANEOUS
  Filled 2011-10-29 (×8): qty 1

## 2011-10-29 MED ORDER — PANTOPRAZOLE SODIUM 40 MG IV SOLR
40.0000 mg | Freq: Every day | INTRAVENOUS | Status: DC
  Administered 2011-10-29 (×2): 40 mg via INTRAVENOUS
  Filled 2011-10-29 (×3): qty 40

## 2011-10-29 MED ORDER — ARIPIPRAZOLE 5 MG PO TABS
5.0000 mg | ORAL_TABLET | Freq: Every day | ORAL | Status: DC
  Administered 2011-10-29 – 2011-10-30 (×2): 5 mg via ORAL
  Filled 2011-10-29 (×2): qty 1

## 2011-10-29 MED ORDER — POTASSIUM CHLORIDE IN NACL 20-0.9 MEQ/L-% IV SOLN
INTRAVENOUS | Status: DC
  Administered 2011-10-29 – 2011-10-30 (×4): via INTRAVENOUS
  Filled 2011-10-29 (×6): qty 1000

## 2011-10-29 MED ORDER — SODIUM CHLORIDE 0.9 % IJ SOLN
3.0000 mL | Freq: Two times a day (BID) | INTRAMUSCULAR | Status: DC
  Administered 2011-10-29 (×2): 3 mL via INTRAVENOUS

## 2011-10-29 MED ORDER — ISOSORBIDE MONONITRATE ER 30 MG PO TB24
30.0000 mg | ORAL_TABLET | Freq: Every day | ORAL | Status: DC
  Administered 2011-10-29 – 2011-10-30 (×2): 30 mg via ORAL
  Filled 2011-10-29 (×2): qty 1

## 2011-10-29 MED ORDER — OLMESARTAN MEDOXOMIL 20 MG PO TABS
20.0000 mg | ORAL_TABLET | Freq: Every day | ORAL | Status: DC
  Administered 2011-10-29 – 2011-10-30 (×2): 20 mg via ORAL
  Filled 2011-10-29 (×2): qty 1

## 2011-10-29 MED ORDER — INSULIN DETEMIR 100 UNIT/ML ~~LOC~~ SOLN
28.0000 [IU] | Freq: Every day | SUBCUTANEOUS | Status: DC
  Administered 2011-10-29 – 2011-10-30 (×2): 28 [IU] via SUBCUTANEOUS
  Filled 2011-10-29: qty 10

## 2011-10-29 MED ORDER — ONDANSETRON HCL 4 MG/2ML IJ SOLN
4.0000 mg | Freq: Four times a day (QID) | INTRAMUSCULAR | Status: DC | PRN
  Administered 2011-10-29 – 2011-10-30 (×5): 4 mg via INTRAVENOUS
  Filled 2011-10-29 (×5): qty 2

## 2011-10-29 MED ORDER — SERTRALINE HCL 100 MG PO TABS
200.0000 mg | ORAL_TABLET | Freq: Every day | ORAL | Status: DC
  Administered 2011-10-29 – 2011-10-30 (×2): 200 mg via ORAL
  Filled 2011-10-29 (×2): qty 2

## 2011-10-29 MED ORDER — NEBIVOLOL HCL 10 MG PO TABS
10.0000 mg | ORAL_TABLET | Freq: Every day | ORAL | Status: DC
  Administered 2011-10-29 – 2011-10-30 (×2): 10 mg via ORAL
  Filled 2011-10-29 (×2): qty 1

## 2011-10-29 MED ORDER — HYDROCODONE-ACETAMINOPHEN 5-325 MG PO TABS
1.0000 | ORAL_TABLET | ORAL | Status: DC | PRN
Start: 2011-10-29 — End: 2011-10-30
  Administered 2011-10-29 – 2011-10-30 (×3): 1 via ORAL
  Filled 2011-10-29 (×3): qty 1

## 2011-10-29 MED ORDER — LAMOTRIGINE 100 MG PO TABS
100.0000 mg | ORAL_TABLET | Freq: Two times a day (BID) | ORAL | Status: DC
  Administered 2011-10-29 – 2011-10-30 (×4): 100 mg via ORAL
  Filled 2011-10-29 (×5): qty 1

## 2011-10-29 NOTE — Consult Note (Signed)
Referring Provider: Dr. Joneen Roach Primary Care Physician:  Bebe Liter, NP, NP Primary Gastroenterologist:  Dr. Dulce Sellar  Reason for Consultation:  Abdominal pain; Pancreatic pseudocysts  HPI: [name] is a 66 y.o. female with a history of chronic pancreatitis and history of pancreatic pseudocysts followed by Dr. Dulce Sellar who did an EUS in May 2012 showing a complicated pancreatic pseudocyst in the body of the pancreas. She was seen last week by Dr. Othelia Pulling at [facility] for an attempt at an endoscopic cystgastrostomy. The pseudocyst was reportedly too necrotic to drain endoscopically and a pancreatic stent was placed. She reports doing fair for 2 days and then developing severe upper abdominal pain this past Friday that persisted for the next 2 days and she came to the hospital Sunday for further treatment. Also, reports nausea and vomiting with this pain. Loose stools since onset of this pain. Denies fevers, chills, confusion. Unable to eat. CT shows increase in size of pancreatic pseudocysts since March with concern for infection.  Reports that her abdominal pain is only minimally better than when she came in and she is nauseous.    Past Medical History  Diagnosis Date  . Myocardial infarction   . Respiratory failure      multifactorial secondary to congestive heart failure, morbid obesity, decreased abdominal  compliance in the setting of pancreatitis.  . Diastolic heart failure   . Leukocytosis     secondary to steroid and pancreatitis.  . DM (diabetes mellitus)   . HTN (hypertension)   . Seizure disorder   . Hyperlipidemia   . Back pain   . Obstructive sleep apnea   . Empty sella     on MRI in 2009.  . Diverticulitis   . Abnormal liver function      in the past.  . Headache   . Pneumonia     Past Surgical History  Procedure Date  . Cholecystectomy   . Abdominal hysterectomy   . Tubal ligation   . Tonsillectomy   . Appendectomy   . Cardiac catheterization    jan 2012  . Rectal polypectomy     Prior to Admission medications   Medication Sig Start Date End Date Taking? Authorizing Provider  albuterol (PROAIR HFA) 108 (90 BASE) MCG/ACT inhaler Inhale 2 puffs into the lungs every 4 (four) hours as needed for wheezing or shortness of breath. 07/27/11 07/26/12 Yes Clinton D Young, MD  ARIPiprazole (ABILIFY) 5 MG tablet Take 5 mg by mouth daily.     Yes Historical Provider, MD  aspirin 81 MG tablet Take 81 mg by mouth daily.     Yes Historical Provider, MD  atorvastatin (LIPITOR) 10 MG tablet Take 10 mg by mouth daily.   Yes Historical Provider, MD  cholestyramine (QUESTRAN) 4 GM/DOSE powder Take 4 g by mouth 2 (two) times daily with a meal.   Yes Historical Provider, MD  esomeprazole (NEXIUM) 40 MG capsule Take 40 mg by mouth daily before breakfast.   Yes Historical Provider, MD  ferrous gluconate (FERGON) 324 MG tablet Take 324 mg by mouth daily with breakfast.   Yes Historical Provider, MD  furosemide (LASIX) 40 MG tablet Take 40 mg by mouth 2 (two) times daily.     Yes Historical Provider, MD  gabapentin (NEURONTIN) 300 MG capsule Take 300 mg by mouth 3 (three) times daily.     Yes Historical Provider, MD  HYDROcodone-acetaminophen (NORCO) 5-325 MG per tablet 1-2 tablets po q 6 hours prn moderate to severe pain 09/13/11  Yes Gavin Pound. Ghim, MD  insulin detemir (LEVEMIR) 100 UNIT/ML injection Inject 28 Units into the skin daily.    Yes Historical Provider, MD  isosorbide mononitrate (IMDUR) 30 MG 24 hr tablet Take 30 mg by mouth daily.     Yes Historical Provider, MD  lamoTRIgine (LAMICTAL) 100 MG tablet Take 100 mg by mouth 2 (two) times daily.     Yes Historical Provider, MD  Liraglutide (VICTOZA) 18 MG/3ML SOLN Inject 1.2 mg into the skin daily.   Yes Historical Provider, MD  metFORMIN (GLUCOPHAGE-XR) 500 MG 24 hr tablet Take 500 mg by mouth 2 (two) times daily.    Yes Historical Provider, MD  Multiple Vitamin (MULTIVITAMIN) tablet Take 1 tablet by mouth  daily.     Yes Historical Provider, MD  nebivolol (BYSTOLIC) 10 MG tablet Take 10 mg by mouth daily.     Yes Historical Provider, MD  omega-3 acid ethyl esters (LOVAZA) 1 G capsule Take 2 g by mouth daily.   Yes Historical Provider, MD  ondansetron (ZOFRAN) 8 MG tablet Take 8 mg by mouth 2 (two) times daily as needed. nausea   Yes Historical Provider, MD  potassium chloride SA (K-DUR,KLOR-CON) 20 MEQ tablet Take 20 mEq by mouth daily.     Yes Historical Provider, MD  sertraline (ZOLOFT) 100 MG tablet Take 200 mg by mouth daily.   Yes Historical Provider, MD  valsartan (DIOVAN) 320 MG tablet Take 320 mg by mouth daily.   Yes Historical Provider, MD    Scheduled Meds:   . sodium chloride   Intravenous Once  . ARIPiprazole  5 mg Oral Daily  . gabapentin  300 mg Oral TID  . heparin  5,000 Units Subcutaneous Q8H  . insulin detemir  28 Units Subcutaneous Daily  . isosorbide mononitrate  30 mg Oral Daily  . lamoTRIgine  100 mg Oral BID  . levofloxacin (LEVAQUIN) IV  750 mg Intravenous QHS  . nebivolol  10 mg Oral Daily  . olmesartan  20 mg Oral Daily  . ondansetron  4 mg Intravenous Once  . pantoprazole (PROTONIX) IV  40 mg Intravenous QHS  . sertraline  200 mg Oral Daily  . sodium chloride  1,000 mL Intravenous Once  . sodium chloride  500 mL Intravenous Once  . sodium chloride  3 mL Intravenous Q12H   Continuous Infusions:   . 0.9 % NaCl with KCl 20 mEq / L 100 mL/hr at 10/29/11 0154   PRN Meds:.acetaminophen, acetaminophen, HYDROcodone-acetaminophen, iohexol, morphine injection, ondansetron (ZOFRAN) IV, ondansetron  Allergies as of 10/28/2011 - Review Complete 10/28/2011  Allergen Reaction Noted  . Codeine  10/26/2010  . Penicillins Hives 10/26/2010  . Sulfa antibiotics Hives 10/26/2010    Family History  Problem Relation Age of Onset  . Allergies Father   . Heart disease Father   . Heart failure Father   . Heart disease Mother     History   Social History  . Marital  Status: Divorced    Spouse Name: N/A    Number of Children: N/A  . Years of Education: N/A   Occupational History  . Disabled    Social History Main Topics  . Smoking status: Former Smoker -- 0.5 packs/day for 25 years    Types: Cigarettes    Quit date: 06/25/1992  . Smokeless tobacco: Never Used  . Alcohol Use: No  . Drug Use: No  . Sexually Active: Not on file   Other Topics Concern  . Not on  file   Social History Narrative  . No narrative on file    Review of Systems: All negative except as stated above in HPI.  Physical Exam: Vital signs: Filed Vitals:   10/29/11 0536  BP: 119/73  Pulse: 87  Temp: 98.9 F (37.2 C)  Resp: 16   Last BM Date: 10/28/11 General:   Lethargic, Obese, mild acute distress Lungs:  Clear throughout to auscultation.   No wheezes, crackles, or rhonchi. No acute distress. Heart:  Regular rate and rhythm; no murmurs, clicks, rubs,  or gallops. Abdomen: Epigastric, RUQ, LUQ tenderness with guarding, soft, mild distention, +BS Rectal:  Deferred  GI:  Lab Results:  Basename 10/29/11 0416 10/28/11 1734  WBC 8.9 10.3  HGB 9.4* 10.0*  HCT 29.2* 30.7*  PLT 215 208   BMET  Basename 10/29/11 0416 10/28/11 1734  NA 131* 126*  K 3.5 3.4*  CL 93* 87*  CO2 26 28  GLUCOSE 188* 187*  BUN 7 12  CREATININE 0.74 0.95  CALCIUM 8.6 8.8   LFT  Basename 10/29/11 0416  PROT 6.6  ALBUMIN 2.6*  AST 10  ALT 8  ALKPHOS 78  BILITOT 0.2*  BILIDIR --  IBILI --   Lipase 15  PT/INR  Basename 10/29/11 0416  LABPROT 15.3*  INR 1.18     Studies/Results: Ct Abdomen Pelvis W Contrast  10/28/2011  *RADIOLOGY REPORT*  Clinical Data: Abdominal pain. Fever.  History of pancreatic pseudocysts.  CT ABDOMEN AND PELVIS WITH CONTRAST  Technique:  Multidetector CT imaging of the abdomen and pelvis was performed following the standard protocol during bolus administration of intravenous contrast.  Contrast: OMNIPAQUE IOHEXOL 300 MG/ML  SOLN   Comparison: CT scan 09/13/2011.  Findings: The lung bases demonstrate a small effusions and bibasilar atelectasis.  The liver is unremarkable and stable.  No focal lesions or intrahepatic biliary dilatation.  The spleen is normal in size.  No focal lesions.  The adrenal glands and kidneys are unremarkable and stable.  There are large pancreatic pseudocysts.  The largest cyst is in the head and body region and measures 9.5 x 5.2 cm.  It previously measured 8.8 x 5.2 cm.  The smaller pseudocyst in the tail region measures 6.8 x 4.2 cm and previously measured 5.8 x 3.5 cm.  The margins  are thickened enhancing and there is inflammation around the pseudocysts.  This was not present on the prior study.  There are also dots of air in the pseudocysts.  These findings are worrisome for infected pseudocysts.  The pancreatic tissue is compressed but there does appears to be some enhancing pancreatic tissue.  The common bile duct is normal in caliber.  There are borderline enlarged peripancreatic lymph nodes which are likely hyperplastic/inflammatory.  The portal and splenic veins are patent.  The stomach, duodenum, small bowel and colon are unremarkable. There is advanced diverticulosis of the descending and sigmoid colon but no findings for acute diverticulitis.  There is a Foley catheter in the bladder.  The uterus is absent. No pelvic mass or adenopathy.  The ovaries are still present and appear normal.  No significant free pelvic fluid collections.  IMPRESSION: Findings worrisome for infected pancreatic pseudocysts.  Original Report Authenticated By: P. Loralie Champagne, M.D.   Dg Chest Port 1 View  10/28/2011  *RADIOLOGY REPORT*  Clinical Data: Chest pain.  PORTABLE CHEST - 1 VIEW  Comparison: 02/23/2011.  Findings: The heart is mildly enlarged but stable.  The mediastinal and hilar contours are  prominent but unchanged.  There are low lung volumes with vascular crowding and bibasilar atelectasis.  Mild vascular  congestion without overt pulmonary edema.  The bony thorax is intact.  IMPRESSION: Low lung volumes with vascular crowding and atelectasis. Mild vascular congestion without overt pulmonary edema.  Original Report Authenticated By: P. Loralie Champagne, M.D.    Impression/Plan: 65yo with chronic pancreatitis and necrotic pancreatic pseudocysts who had a recent pancreatic stent placed as a temporizing measure since cyst gastrostomy was not possible last week due to the necrotic pseudocysts. She has worsened abdominal pain and needs surgical debridement at a tertiary care center. I would not recommend IR drainage at this juncture. Have discussed with Dr. Dulce Sellar, and her sister [name] [phone] and Dr. Dulce Sellar will call Dr. Logan Bores about a possible inpt transfer. If her pain improved she could go home and see [facility] as an outpt but I do not think she will improve enough to see them as an outpt with the enlarging cysts and necrotic appearance and needs definitive surgical management. Continue bowel rest, pain meds, and antiemetics for now. Will work towards a transfer to [facility] for this week.    LOS: 1 day   Waleska Buttery C.  10/29/2011, 8:51 AM

## 2011-10-29 NOTE — Progress Notes (Signed)
ANTIBIOTIC CONSULT NOTE - INITIAL  Pharmacy Consult for Levaquin Indication: Infected pseudocyst    Allergies  Allergen Reactions  . Codeine     Headache and "just feel off"  . Penicillins Hives  . Sulfa Antibiotics Hives    Patient Measurements: Height: 5\' 1"  (154.9 cm) Weight: 233 lb 0.4 oz (105.7 kg) IBW/kg (Calculated) : 47.8  Adjusted Body Weight:   Vital Signs: Temp: 98.2 F (36.8 C) (05/06 0116) Temp src: Oral (05/06 0116) BP: 102/63 mmHg (05/06 0116) Pulse Rate: 72  (05/06 0116) Intake/Output from previous day: 05/05 0701 - 05/06 0700 In: 2500 [I.V.:2500] Out: 1000 [Urine:1000] Intake/Output from this shift: Total I/O In: 2500 [I.V.:2500] Out: 1000 [Urine:1000]  Labs:  Basename 10/28/11 1734  WBC 10.3  HGB 10.0*  PLT 208  LABCREA --  CREATININE 0.95   Estimated Creatinine Clearance: 67.9 ml/min (by C-G formula based on Cr of 0.95). No results found for this basename: VANCOTROUGH:2,VANCOPEAK:2,VANCORANDOM:2,GENTTROUGH:2,GENTPEAK:2,GENTRANDOM:2,TOBRATROUGH:2,TOBRAPEAK:2,TOBRARND:2,AMIKACINPEAK:2,AMIKACINTROU:2,AMIKACIN:2, in the last 72 hours   Microbiology: No results found for this or any previous visit (from the past 720 hour(s)).  Medical History: Past Medical History  Diagnosis Date  . Myocardial infarction   . Respiratory failure      multifactorial secondary to congestive heart failure, morbid obesity, decreased abdominal  compliance in the setting of pancreatitis.  . Diastolic heart failure   . Leukocytosis     secondary to steroid and pancreatitis.  . DM (diabetes mellitus)   . HTN (hypertension)   . Seizure disorder   . Hyperlipidemia   . Back pain   . Obstructive sleep apnea   . Empty sella     on MRI in 2009.  . Diverticulitis   . Abnormal liver function      in the past.  . Headache   . Pneumonia     Medications:  Anti-infectives     Start     Dose/Rate Route Frequency Ordered Stop   10/29/11 0145   levofloxacin  (LEVAQUIN) IVPB 750 mg        750 mg 100 mL/hr over 90 Minutes Intravenous Daily at bedtime 10/29/11 0139           Assessment: Patient with Infected pseudocyst.   Goal of Therapy:  Levaquin dosed based on patient weight and renal function  Plan:  Levaquin 750mg  iv q24hr  Darlina Guys, [place] Mielke Crowford 10/29/2011,2:11 AM

## 2011-10-29 NOTE — Progress Notes (Signed)
Inpatient Diabetes Program Recommendations  AACE/ADA: New Consensus Statement on Inpatient Glycemic Control (2009)  Target Ranges:  Prepandial:   less than 140 mg/dL      Peak postprandial:   less than 180 mg/dL (1-2 hours)      Critically ill patients:  140 - 180 mg/dL   Reason for Visit: Hyperglycemia  Results for [name], [name] (MRN [id number]) as of 10/29/2011 14:53  Ref. Range 10/29/2011 04:16 10/29/2011 11:17  Sodium Latest Range: 135-145 mEq/L 131 (L)   Potassium Latest Range: 3.5-5.1 mEq/L 3.5   Chloride Latest Range: 96-112 mEq/L 93 (L)   CO2 Latest Range: 19-32 mEq/L 26   BUN Latest Range: 6-23 mg/dL 7   Creat Latest Range: 0.50-1.10 mg/dL 7.82   Calcium Latest Range: 8.4-10.5 mg/dL 8.6   GFR calc non Af Amer Latest Range: >90 mL/min 89 (L)   GFR calc Af Amer Latest Range: >90 mL/min >90   Glucose Latest Range: 70-99 mg/dL 956 (H)   Results for [name], [name] (MRN [id number]) as of 10/29/2011 14:53  Ref. Range 10/29/2011 04:16 10/29/2011 11:17  Sodium Latest Range: 135-145 mEq/L 131 (L)   Potassium Latest Range: 3.5-5.1 mEq/L 3.5   Chloride Latest Range: 96-112 mEq/L 93 (L)   CO2 Latest Range: 19-32 mEq/L 26   BUN Latest Range: 6-23 mg/dL 7   Creat Latest Range: 0.50-1.10 mg/dL 4.69   Calcium Latest Range: 8.4-10.5 mg/dL 8.6   GFR calc non Af Amer Latest Range: >90 mL/min 89 (L)   GFR calc Af Amer Latest Range: >90 mL/min >90   Glucose Latest Range: 70-99 mg/dL 629 (H)     Inpatient Diabetes Program Recommendations Correction (SSI): Add Novolog  moderate Q 4 hours while NPO, then to tidwc and hs HgbA1C: Need updated HgbA1C - previously 10.6 on 01/03/11  Note: Will follow.

## 2011-10-29 NOTE — Progress Notes (Signed)
Patient ID: [name], female   DOB: [birth year], 66 y.o.   MRN: [id number]  Subjective: No events overnight. Patient denies chest pain, shortness of breath. Pt is still reporting abdominal pain.   Objective:  Vital signs in last 24 hours:  Filed Vitals:   10/29/11 0116 10/29/11 0536 10/29/11 1300  BP: 102/63 119/73 124/77  Pulse: 72 87 79  Temp: 98.2 F (36.8 C) 98.9 F (37.2 C) 98.7 F (37.1 C)  SpO2: 95% 98% 99%   Intake/Output from previous day:   Intake/Output Summary (Last 24 hours) at 10/29/11 1527 Last data filed at 10/29/11 1191  Gross per 24 hour  Intake   3250 ml  Output   1450 ml  Net   1800 ml   Physical Exam: General: Alert, awake, oriented x3, in no acute distress. HEENT: No bruits, no goiter. Moist mucous membranes, no scleral icterus, no conjunctival pallor. Heart: Regular rate and rhythm, S1/S2 +, no murmurs, rubs, gallops. Lungs: Clear to auscultation bilaterally with bibasilar crackles. No wheezing, no rhonchi, no rales.  Abdomen: Soft, tender in the epigastric area, mildly distended, positive bowel sounds. Extremities: No clubbing or cyanosis, no pitting edema,  positive pedal pulses. Neuro: Grossly nonfocal.  Lab Results:  Lab 10/29/11 0416 10/28/11 1734  WBC 8.9 10.3  HGB 9.4* 10.0*  HCT 29.2* 30.7*  PLT 215 208   Lab 10/29/11 0416 10/28/11 1734  NA 131* 126*  K 3.5 3.4*  CL 93* 87*  CO2 26 28  GLUCOSE 188* 187*  BUN 7 12  CREATININE 0.74 0.95  CALCIUM 8.6 8.8   Studies/Results:  Ct Abdomen Pelvis W Contrast 10/28/2011  IMPRESSION:  Findings worrisome for infected pancreatic pseudocysts.     Dg Chest Port 1 View 10/28/2011   IMPRESSION:  Low lung volumes with vascular crowding and atelectasis.  Mild vascular congestion without overt pulmonary edema.    Medications: Scheduled Meds:   . ARIPiprazole  5 mg Oral Daily  . gabapentin  300 mg Oral TID  . heparin  5,000 Units Subcutaneous Q8H  . insulin detemir  28 Units  Subcutaneous Daily  . isosorbide mononitrate  30 mg Oral Daily  . lamoTRIgine  100 mg Oral BID  . levofloxacin IV  750 mg Intravenous QHS  . nebivolol  10 mg Oral Daily  . olmesartan  20 mg Oral Daily  . ondansetron  4 mg Intravenous Once  . pantoprazole IV  40 mg Intravenous QHS  . sertraline  200 mg Oral Daily   Continuous Infusions:   . 0.9 % NaCl with KCl 20 mEq / L 100 mL/hr at 10/29/11 1303   PRN Meds:.acetaminophen, acetaminophen, HYDROcodone-acetaminophen, iohexol, morphine injection, ondansetron (ZOFRAN) IV, ondansetron  Assessment/Plan:  Principal Problem:  *Infected pseudocyst of pancreas - GI involved in pt's care - we appreciate the input - will continue supportive care with IVF, antiemetics, analgesia for adequate pain control - pt prefers to be transferred to [facility] from the hospital rather than home setting, this was also confirmed with pt;s sister  Active Problems:  Obesity hypoventilation syndrome - pt maintaining oxygen saturations > 93% on RA - continue to monitor vitals per floor protocol   Diabetes mellitus - check A1C and continue insulin as noted above   Hypertension - slightly on the lower side  - will continue home medication regimen but will need to readjust as indicated  Anemia - of chronic disease - follow up on CBC   EDUCATION - test results and diagnostic  studies were discussed with patient and pt's family (sister over the phone) - patient and family have verbalized the understanding - questions were answered at the bedside and contact information was provided for additional questions or concerns   LOS: 1 day   MAGICK-Gemma Ruan 10/29/2011, 3:27 PM  [facility] Pager: [phone]

## 2011-10-29 NOTE — Progress Notes (Signed)
Returned call to ED RN for report.

## 2011-10-29 NOTE — Progress Notes (Signed)
   CARE MANAGEMENT NOTE 10/29/2011  Patient:  [name], [name]   Account Number:  [id number]  Date Initiated:  10/29/2011  Documentation initiated by:  Jiles Crocker  Subjective/Objective Assessment:   ADMITTED WITH ABDOMINAL PAIN, CHRONIC PANCREATITIS, PANCREATITIC PSEUDOCYST     Action/Plan:   PCP IS DR Dulce Sellar; LIVES AT HOME   Anticipated DC Date:  11/05/2011   Anticipated DC Plan:  HOME/SELF CARE           Status of service:  In process, will continue to follow Medicare Important Message given?   (If response is "NO", the following Medicare IM given date fields will be blank)  Discharge Disposition:  HOME  Per UR Regulation:  Reviewed for med. necessity/level of care/duration of stay  Comments:  10/29/2011- B Dedria Endres RN, BSN, MHA

## 2011-10-29 NOTE — ED Notes (Signed)
Attempted to call report- spoke with [facility]- request 5 minutes and would call back

## 2011-10-30 DIAGNOSIS — K863 Pseudocyst of pancreas: Secondary | ICD-10-CM

## 2011-10-30 DIAGNOSIS — K861 Other chronic pancreatitis: Secondary | ICD-10-CM

## 2011-10-30 DIAGNOSIS — E1165 Type 2 diabetes mellitus with hyperglycemia: Secondary | ICD-10-CM

## 2011-10-30 DIAGNOSIS — I10 Essential (primary) hypertension: Secondary | ICD-10-CM

## 2011-10-30 DIAGNOSIS — K862 Cyst of pancreas: Secondary | ICD-10-CM

## 2011-10-30 LAB — BASIC METABOLIC PANEL
BUN: 6 mg/dL (ref 6–23)
CO2: 25 mEq/L (ref 19–32)
Calcium: 8.8 mg/dL (ref 8.4–10.5)
Chloride: 98 mEq/L (ref 96–112)
Creatinine, Ser: 0.73 mg/dL (ref 0.50–1.10)
GFR calc Af Amer: >90 mL/min (ref >90)
GFR calc non Af Amer: 89 mL/min — ABNORMAL LOW (ref >90)
Glucose, Bld: 105 mg/dL — ABNORMAL HIGH (ref 70–99)
Potassium: 3.9 mEq/L (ref 3.5–5.1)
Sodium: 134 mEq/L — ABNORMAL LOW (ref 135–145)

## 2011-10-30 LAB — CBC
HCT: 29.1 % — ABNORMAL LOW (ref 36.0–46.0)
Hemoglobin: 9.3 g/dL — ABNORMAL LOW (ref 12.0–15.0)
MCH: 22.9 pg — ABNORMAL LOW (ref 26.0–34.0)
MCHC: 32 g/dL (ref 30.0–36.0)
MCV: 71.7 fL — ABNORMAL LOW (ref 78.0–100.0)
Platelets: 231 10*3/uL (ref 150–400)
RBC: 4.06 MIL/uL (ref 3.87–5.11)
RDW: 16 % — ABNORMAL HIGH (ref 11.5–15.5)
WBC: 8 10*3/uL (ref 4.0–10.5)

## 2011-10-30 NOTE — Progress Notes (Signed)
Patient ID: [name], female   DOB: 1945-10-07, 66 y.o.   MRN: [id number] Dr. Dulce Sellar will obtain an admitting doctor at [facility] for inpt transfer and further management of her pseudocysts. Dr. Izola Price aware and patient agreeable to go as an inpt to [facility].

## 2011-10-30 NOTE — Discharge Instructions (Signed)

## 2011-10-30 NOTE — Discharge Summary (Signed)
Patient ID: [name] MRN: [id number] DOB/AGE: 30-Sep-1945 66 y.o.  Admit date: 10/28/2011 Discharge date: 10/30/2011  Primary Care Physician:  Bebe Liter, NP, NP  Discharge Diagnoses:  Infected Pseudocyst of the pancreas  Present on Admission:  .Infected pseudocyst of pancreas .Obesity hypoventilation syndrome  Principal Problem:  *Infected pseudocyst of pancreas Active Problems:  Obesity hypoventilation syndrome  Pancreatitis chronic  CAD (coronary artery disease)  Diabetes mellitus  Hypertension  Seizure  Chronic respiratory failure  Dyslipidemia  Obesity   Medication List  As of 10/30/2011  8:40 AM   TAKE these medications         albuterol 108 (90 BASE) MCG/ACT inhaler   Commonly known as: PROVENTIL HFA;VENTOLIN HFA   Inhale 2 puffs into the lungs every 4 (four) hours as needed for wheezing or shortness of breath.      ARIPiprazole 5 MG tablet   Commonly known as: ABILIFY   Take 5 mg by mouth daily.      aspirin 81 MG tablet   Take 81 mg by mouth daily.      atorvastatin 10 MG tablet   Commonly known as: LIPITOR   Take 10 mg by mouth daily.      cholestyramine 4 GM/DOSE powder   Commonly known as: QUESTRAN   Take 4 g by mouth 2 (two) times daily with a meal.      esomeprazole 40 MG capsule   Commonly known as: NEXIUM   Take 40 mg by mouth daily before breakfast.      ferrous gluconate 324 MG tablet   Commonly known as: FERGON   Take 324 mg by mouth daily with breakfast.      furosemide 40 MG tablet   Commonly known as: LASIX   Take 40 mg by mouth 2 (two) times daily.      gabapentin 300 MG capsule   Commonly known as: NEURONTIN   Take 300 mg by mouth 3 (three) times daily.      HYDROcodone-acetaminophen 5-325 MG per tablet   Commonly known as: NORCO   1-2 tablets po q 6 hours prn moderate to severe pain      insulin detemir 100 UNIT/ML injection   Commonly known as: LEVEMIR   Inject 28 Units into the skin daily.      isosorbide  mononitrate 30 MG 24 hr tablet   Commonly known as: IMDUR   Take 30 mg by mouth daily.      LAMICTAL 100 MG tablet   Generic drug: lamoTRIgine   Take 100 mg by mouth 2 (two) times daily.      metFORMIN 500 MG 24 hr tablet   Commonly known as: GLUCOPHAGE-XR   Take 500 mg by mouth 2 (two) times daily.      multivitamin tablet   Take 1 tablet by mouth daily.      nebivolol 10 MG tablet   Commonly known as: BYSTOLIC   Take 10 mg by mouth daily.      omega-3 acid ethyl esters 1 G capsule   Commonly known as: LOVAZA   Take 2 g by mouth daily.      ondansetron 8 MG tablet   Commonly known as: ZOFRAN   Take 8 mg by mouth 2 (two) times daily as needed. nausea      potassium chloride SA 20 MEQ tablet   Commonly known as: K-DUR,KLOR-CON   Take 20 mEq by mouth daily.      sertraline 100 MG tablet  Commonly known as: ZOLOFT   Take 200 mg by mouth daily.      valsartan 320 MG tablet   Commonly known as: DIOVAN   Take 320 mg by mouth daily.      VICTOZA 18 MG/3ML Soln   Generic drug: Liraglutide   Inject 1.2 mg into the skin daily.            Disposition and Follow-up: Will arrange transport to [facility] for further evaluation and management. Accepting physician Dr. Lorrin Jackson  Consults:  GI - Dr. Bosie Clos  Significant Diagnostic Studies: Ct Abdomen Pelvis W Contrast 10/28/2011   Clinical Data: Abdominal pain. Fever.  History of pancreatic pseudocysts.   CT ABDOMEN AND PELVIS WITH CONTRAST    Findings: The lung bases demonstrate a small effusions and bibasilar atelectasis.  The liver is unremarkable and stable.  No focal lesions or intrahepatic biliary dilatation.  The spleen is normal in size.  No focal lesions.  The adrenal glands and kidneys are unremarkable and stable.  There are large pancreatic pseudocysts.  The largest cyst is in the head and body region and measures 9.5 x 5.2 cm.  It previously measured 8.8 x 5.2 cm.  The smaller pseudocyst in the tail region measures 6.8 x  4.2 cm and previously measured 5.8 x 3.5 cm.  The margins  are thickened enhancing and there is inflammation around the pseudocysts.  This was not present on the prior study.  There are also dots of air in the pseudocysts.  These findings are worrisome for infected pseudocysts.  The pancreatic tissue is compressed but there does appears to be some enhancing pancreatic tissue.  The common bile duct is normal in caliber.  There are borderline enlarged peripancreatic lymph nodes which are likely hyperplastic/inflammatory.  The portal and splenic veins are patent.  The stomach, duodenum, small bowel and colon are unremarkable. There is advanced diverticulosis of the descending and sigmoid colon but no findings for acute diverticulitis.  There is a Foley catheter in the bladder.  The uterus is absent. No pelvic mass or adenopathy.  The ovaries are still present and appear normal.  No significant free pelvic fluid collections.    IMPRESSION: Findings worrisome for infected pancreatic pseudocysts.    Dg Chest Port 1 View 10/28/2011  IMPRESSION:  Low lung volumes with vascular crowding and atelectasis. Mild vascular congestion without overt pulmonary edema.    Brief H and P: This is a 66 year old female with a chronic pancreatitis approximately a year, with chronic pseudocysts. She's been followed by Dr. Dulce Sellar with [facility]. She was referred to a gastroenterologist in [facility] by Dr. Dulce Sellar where an attempt was made at an ERCP last week. This was unsuccessful, because per patient and sister, her pancreas was too inflamed. She was discharged home, she did well for the first 2 days, then developed a nausea, somnolence, abdominal pain greatest in the bilateral upper quadrants and generalized weakness. She is not eating. She was brought the ER today by her sister. The patient denies any vomiting, diarrhea, fevers, chills, burning on urination or altered mentation. CT abdomen and pelvis done, is read as infected  pseudocyst.  Physical Exam on Discharge:  Filed Vitals:   10/29/11 1300 10/29/11 2159 10/30/11 0610  BP: 124/77 110/67 97/62  Pulse: 79 76 72  Temp: 98.7 F (37.1 C) 98.3 F (36.8 C) 97.8 F (36.6 C)  Weight: 232 lb 8 oz    SpO2: 99% 97% 97%    Intake/Output Summary (Last 24  hours) at 10/30/11 0840 Last data filed at 10/30/11 0615  Gross per 24 hour  Intake 1743.33 ml  Output    950 ml  Net 793.33 ml    General: Alert, awake, oriented x3, in no acute distress. HEENT: No bruits, no goiter. Heart: Regular rate and rhythm, without murmurs, rubs, gallops. Lungs: Clear to auscultation bilaterally. Abdomen: Soft, tender in epigastric area, nondistended, positive bowel sounds. Extremities: No clubbing cyanosis or edema with positive pedal pulses. Neuro: Grossly intact, nonfocal.  Lab 10/30/11 0454 10/29/11 0416 10/28/11 1734  WBC 8.0 8.9 10.3  HGB 9.3* 9.4* 10.0*  HCT 29.1* 29.2* 30.7*  PLT 231 215 208    Lab 10/30/11 0454 10/29/11 0416 10/28/11 1734  NA 134* 131* 126*  K 3.9 3.5 3.4*  CL 98 93* 87*  CO2 25 26 28   GLUCOSE 105* 188* 187*  BUN 6 7 12   CREATININE 0.73 0.74 0.95  CALCIUM 8.8 8.6 8.8   URINE CULTURE: No growth to date BLOOD CULTURES: No growth to date  Hospital Course:  Principal Problem:  *Infected pseudocyst of pancreas  - supportive care with IVF, antiemetics, analgesia for adequate pain control was provided - pt continues to report pain but somewhat better with pain medication - we have also started Levaquin and this may need to be continued depending on recommendation by admitting physician - pt to be transferred to [facility] medical center for further evaluation and management - spoke with Dr. Dulce Sellar who has arranged for the transfer, accepting physician is Dr. Lorrin Jackson - I will complete the discharge transfer   Active Problems:  Obesity hypoventilation syndrome  - pt maintaining oxygen saturations > 93% on RA - continue to monitor vitals per floor  protocol   Diabetes mellitus  - we continued insulin as per patient home medication regimen  Hypertension  - slightly on the lower side  - will continue home medication regimen but will need to readjust as indicated   Anemia  - of chronic disease  - follow up on CBC   EDUCATION  - test results and diagnostic studies were discussed with patient and pt's family sister - patient and family have verbalized the understanding  - questions were answered at the bedside and contact information was provided for additional questions or concerns   LOS: 1 day    Time spent on Discharge: Over 30 minutes  Signed: Debbora Presto 10/30/2011, 8:40 AM  [facility], pager #: [phone] Main office number: [phone]

## 2011-11-04 LAB — CULTURE, BLOOD (ROUTINE X 2)
Culture  Setup Time: 201305060159
Culture  Setup Time: 201305060159
Culture: NO GROWTH
Culture: NO GROWTH

## 2011-11-21 DIAGNOSIS — R011 Cardiac murmur, unspecified: Secondary | ICD-10-CM | POA: Insufficient documentation

## 2012-01-09 ENCOUNTER — Encounter: Payer: Self-pay | Admitting: Cardiology

## 2012-01-10 ENCOUNTER — Encounter: Payer: Self-pay | Admitting: Cardiovascular Disease

## 2012-02-05 ENCOUNTER — Other Ambulatory Visit: Payer: Self-pay

## 2012-02-05 DIAGNOSIS — I83893 Varicose veins of bilateral lower extremities with other complications: Secondary | ICD-10-CM

## 2012-03-20 ENCOUNTER — Encounter: Payer: Self-pay | Admitting: Vascular Surgery

## 2012-03-21 ENCOUNTER — Encounter: Payer: Self-pay | Admitting: Vascular Surgery

## 2012-03-21 ENCOUNTER — Encounter (INDEPENDENT_AMBULATORY_CARE_PROVIDER_SITE_OTHER): Payer: Medicare HMO | Admitting: *Deleted

## 2012-03-21 ENCOUNTER — Ambulatory Visit (INDEPENDENT_AMBULATORY_CARE_PROVIDER_SITE_OTHER): Payer: Medicare HMO | Admitting: Vascular Surgery

## 2012-03-21 VITALS — BP 153/75 | HR 69 | Resp 16 | Ht 66.0 in | Wt 227.0 lb

## 2012-03-21 DIAGNOSIS — IMO0002 Reserved for concepts with insufficient information to code with codable children: Secondary | ICD-10-CM

## 2012-03-21 DIAGNOSIS — R609 Edema, unspecified: Secondary | ICD-10-CM

## 2012-03-21 DIAGNOSIS — M79606 Pain in leg, unspecified: Secondary | ICD-10-CM

## 2012-03-21 DIAGNOSIS — I83893 Varicose veins of bilateral lower extremities with other complications: Secondary | ICD-10-CM

## 2012-03-21 DIAGNOSIS — M5410 Radiculopathy, site unspecified: Secondary | ICD-10-CM

## 2012-03-21 DIAGNOSIS — M79609 Pain in unspecified limb: Secondary | ICD-10-CM

## 2012-03-21 NOTE — Progress Notes (Signed)
[facility] [place]  Referred by:  Bebe Liter, NP [phone] [place] [address],  Reason for referral: Painful left leg  History of Present Illness  [name] is a 65 y.o. (11-Mar-1946) female who presents with chief complaint: pain in left leg.  Patient has bilateral varicosities for multiple years, since prior pregnancies x 2.  Over last two months, she has had radicular sx radiating down her left leg, described as "electric sharp sensations."  The patient has had no history of DVT, known history of varicose vein, no history of venous stasis ulcers, no history of  Lymphedema and no history of skin changes in lower legs.  There is known family history of venous disorders.  The patient has used medical grade compression stockings in the past for > 3 months.  Past Medical History  Diagnosis Date  . Myocardial infarction   . Respiratory failure      multifactorial secondary to congestive heart failure, morbid obesity, decreased abdominal  compliance in the setting of pancreatitis.  . Diastolic heart failure   . Leukocytosis     secondary to steroid and pancreatitis.  . DM (diabetes mellitus)   . HTN (hypertension)   . Seizure disorder   . Hyperlipidemia   . Back pain   . Obstructive sleep apnea   . Empty sella     on MRI in 2009.  . Diverticulitis   . Abnormal liver function      in the past.  . Headache   . Pneumonia   . Stroke     Past Surgical History  Procedure Date  . Cholecystectomy   . Abdominal hysterectomy   . Tubal ligation   . Tonsillectomy   . Appendectomy   . Cardiac catheterization     jan 2012  . Rectal polypectomy     History   Social History  . Marital Status: Divorced    Spouse Name: N/A    Number of Children: N/A  . Years of Education: N/A   Occupational History  . Disabled    Social History Main Topics  . Smoking status: Former Smoker -- 0.5 packs/day for 25 years    Types:  Cigarettes    Quit date: 03/21/1982  . Smokeless tobacco: Never Used  . Alcohol Use: No  . Drug Use: No  . Sexually Active: Not on file   Other Topics Concern  . Not on file   Social History Narrative  . No narrative on file    Family History  Problem Relation Age of Onset  . Allergies Father   . Heart disease Father     before 71  . Heart failure Father   . Hypertension Father   . Hyperlipidemia Father   . Heart disease Mother   . Diabetes Mother   . Hypertension Mother   . Hyperlipidemia Mother   . Hypertension Sister   . Heart disease Sister     before 42  . Hyperlipidemia Sister     Current Outpatient Prescriptions on File Prior to Visit  Medication Sig Dispense Refill  . albuterol (PROAIR HFA) 108 (90 BASE) MCG/ACT inhaler Inhale 2 puffs into the lungs every 4 (four) hours as needed for wheezing or shortness of breath.  1 Inhaler  5  . ARIPiprazole (ABILIFY) 5 MG tablet Take 5 mg by mouth daily.        Marland Kitchen aspirin 81 MG tablet Take 81 mg by mouth daily.        Marland Kitchen  atorvastatin (LIPITOR) 10 MG tablet Take 10 mg by mouth daily.      . cholestyramine (QUESTRAN) 4 GM/DOSE powder Take 4 g by mouth 2 (two) times daily with a meal.      . esomeprazole (NEXIUM) 40 MG capsule Take 40 mg by mouth daily before breakfast.      . ferrous gluconate (FERGON) 324 MG tablet Take 324 mg by mouth daily with breakfast.      . furosemide (LASIX) 40 MG tablet Take 40 mg by mouth 2 (two) times daily.        Marland Kitchen gabapentin (NEURONTIN) 300 MG capsule Take 300 mg by mouth 3 (three) times daily.        Marland Kitchen HYDROcodone-acetaminophen (NORCO) 5-325 MG per tablet 1-2 tablets po q 6 hours prn moderate to severe pain  15 tablet  0  . insulin detemir (LEVEMIR) 100 UNIT/ML injection Inject 28 Units into the skin daily.       . isosorbide mononitrate (IMDUR) 30 MG 24 hr tablet Take 30 mg by mouth daily.        Marland Kitchen lamoTRIgine (LAMICTAL) 100 MG tablet Take 100 mg by mouth 2 (two) times daily.        .  Liraglutide (VICTOZA) 18 MG/3ML SOLN Inject 1.2 mg into the skin daily.      . metFORMIN (GLUCOPHAGE-XR) 500 MG 24 hr tablet Take 1,000 mg by mouth 2 (two) times daily.       . Multiple Vitamin (MULTIVITAMIN) tablet Take 1 tablet by mouth daily.        . nebivolol (BYSTOLIC) 10 MG tablet Take 10 mg by mouth daily.        Marland Kitchen omega-3 acid ethyl esters (LOVAZA) 1 G capsule Take 2 g by mouth daily.      . ondansetron (ZOFRAN) 8 MG tablet Take 8 mg by mouth 2 (two) times daily as needed. nausea      . potassium chloride SA (K-DUR,KLOR-CON) 20 MEQ tablet Take 20 mEq by mouth daily.        . sertraline (ZOLOFT) 100 MG tablet Take 50 mg by mouth daily.       . valsartan (DIOVAN) 320 MG tablet Take 320 mg by mouth daily.        Allergies  Allergen Reactions  . Codeine     Headache and "just feel off"  . Penicillins Hives  . Sulfa Antibiotics Hives    REVIEW OF SYSTEMS:  (Positives checked otherwise negative)  CARDIOVASCULAR:  [ ]  chest pain, [x]  chest pressure, [ ]  palpitations, [x]  shortness of breath when laying flat, [x]  shortness of breath with exertion,   [x]  pain in feet when walking, [ ]  pain in feet when laying flat, [ ]  history of blood clot in veins (DVT), [ ]  history of phlebitis, [x]  swelling in legs, [x]  varicose veins  PULMONARY:  [x]  productive cough, [ ]  asthma, [ ]  wheezing  NEUROLOGIC:  [x]  weakness in arms or legs, [x]  numbness in arms or legs, [x]  difficulty speaking or slurred speech, [ ]  temporary loss of vision in one eye, [x]  dizziness  HEMATOLOGIC:  [ ]  bleeding problems, [ ]  problems with blood clotting too easily  MUSCULOSKEL:  [ ]  joint pain, [ ]  joint swelling  GASTROINTEST:  [ ]   Vomiting blood, [ ]   Blood in stool     GENITOURINARY:  [ ]   Burning with urination, [ ]   Blood in urine  1. PSYCHIATRIC:  [x]  history of major depression  INTEGUMENTARY:  [ ]  rashes, [ ]  ulcers  CONSTITUTIONAL:  [ ]  fever, [ ]  chills  Physical Examination  Filed Vitals:    03/21/12 1114  BP: 153/75  Pulse: 69  Resp: 16  Height: 5\' 6"  (1.676 m)  Weight: 227 lb (102.967 kg)  SpO2: 97%   Body mass index is 36.64 kg/(m^2).  General: A&O x 3, WD, morbidly obese  Head: [place]/AT  Ear/Nose/Throat: Hearing grossly intact, nares w/o erythema or drainage, oropharynx w/o Erythema/Exudate  Eyes: PERRLA, EOMI  Neck: Supple, no nuchal rigidity, no palpable LAD  Pulmonary: Sym exp, good air movt, CTAB, no rales, rhonchi, & wheezing  Cardiac: RRR, Nl S1, S2, no Murmurs, rubs or gallops  Vascular: Vessel Right Left  Radial Palpable Palpable  Ulnar Palpable Palpable  Brachial Palpable Palpable  Carotid Palpable, without bruit Palpable, without bruit  Aorta Not palpable N/A  Femoral Palpable Palpable  Popliteal Not palpable Not palpable  PT Palpable Palpable  DP Palpable Palpable   Gastrointestinal: soft, NTND, -G/R, - HSM, - masses, - CVAT B  Musculoskeletal: M/S 5/5 throughout , Extremities without ischemic changes , B varicosities and spider veins, no LDS bilaterally, some lipedema like fat distribution  Neurologic: CN 2-12 intact , Pain and light touch intact in extremities , Motor exam as listed above  Psychiatric: Judgment intact, Mood & affect appropriate for pt's clinical situation  Dermatologic: See M/S exam for extremity exam, no rashes otherwise noted  Lymph : No Cervical, Axillary, or Inguinal lymphadenopathy   Non-Invasive Vascular Imaging  BLE Venous Insufficiency Duplex (Date: 03/21/12):   RLE: deep reflux with some SFJ reflux  LLE: deep and superficial reflux  Outside Studies/Documentation 3 pages of outside documents were reviewed including: outpatient charts.  Medical Decision Making  [name] is a 66 y.o. female who presents with: CVI (C2) and radicular LLE pain.   Based on the patient's history and examination, I recommend: referral to spine evaluation given LLE radicular sx c/w possible nerve root compression.  I  gave the patient a prescription for 20-30 mm Hg compression stockings for use for management of her BLE CVI.  She has significant CVI but I doubt this is the source of her radicular sx.  If in three months, she would like to proceed with EVLA L GSV, I have setup a follow up appointment with the [facility].  Thank you for allowing [place] to participate in this patient's care.  Leonides Sake, MD [facility] Office: [phone] Pager: [phone]  03/21/2012, 11:53 AM

## 2012-04-23 ENCOUNTER — Emergency Department (HOSPITAL_COMMUNITY)
Admission: EM | Admit: 2012-04-23 | Discharge: 2012-04-23 | Disposition: A | Discharge status: Home / Self Care | Payer: Medicare HMO | Attending: Emergency Medicine | Admitting: Emergency Medicine

## 2012-04-23 ENCOUNTER — Encounter (HOSPITAL_COMMUNITY): Payer: Self-pay | Admitting: *Deleted

## 2012-04-23 DIAGNOSIS — Z7982 Long term (current) use of aspirin: Secondary | ICD-10-CM | POA: Insufficient documentation

## 2012-04-23 DIAGNOSIS — I5030 Unspecified diastolic (congestive) heart failure: Secondary | ICD-10-CM | POA: Insufficient documentation

## 2012-04-23 DIAGNOSIS — G4733 Obstructive sleep apnea (adult) (pediatric): Secondary | ICD-10-CM | POA: Insufficient documentation

## 2012-04-23 DIAGNOSIS — I10 Essential (primary) hypertension: Secondary | ICD-10-CM | POA: Insufficient documentation

## 2012-04-23 DIAGNOSIS — R569 Unspecified convulsions: Secondary | ICD-10-CM

## 2012-04-23 DIAGNOSIS — E785 Hyperlipidemia, unspecified: Secondary | ICD-10-CM | POA: Insufficient documentation

## 2012-04-23 DIAGNOSIS — Z79899 Other long term (current) drug therapy: Secondary | ICD-10-CM | POA: Insufficient documentation

## 2012-04-23 DIAGNOSIS — G40909 Epilepsy, unspecified, not intractable, without status epilepticus: Secondary | ICD-10-CM | POA: Insufficient documentation

## 2012-04-23 DIAGNOSIS — Z8719 Personal history of other diseases of the digestive system: Secondary | ICD-10-CM | POA: Insufficient documentation

## 2012-04-23 DIAGNOSIS — I252 Old myocardial infarction: Secondary | ICD-10-CM | POA: Insufficient documentation

## 2012-04-23 DIAGNOSIS — Z8701 Personal history of pneumonia (recurrent): Secondary | ICD-10-CM | POA: Insufficient documentation

## 2012-04-23 DIAGNOSIS — E119 Type 2 diabetes mellitus without complications: Secondary | ICD-10-CM | POA: Insufficient documentation

## 2012-04-23 DIAGNOSIS — Z794 Long term (current) use of insulin: Secondary | ICD-10-CM | POA: Insufficient documentation

## 2012-04-23 DIAGNOSIS — Z87891 Personal history of nicotine dependence: Secondary | ICD-10-CM | POA: Insufficient documentation

## 2012-04-23 LAB — POCT I-STAT, CHEM 8
BUN: 20 mg/dL (ref 6–23)
Calcium, Ion: 1.09 mmol/L — ABNORMAL LOW (ref 1.13–1.30)
Chloride: 99 mEq/L (ref 96–112)
Creatinine, Ser: 1.1 mg/dL (ref 0.50–1.10)
Glucose, Bld: 140 mg/dL — ABNORMAL HIGH (ref 70–99)
HCT: 35 % — ABNORMAL LOW (ref 36.0–46.0)
Hemoglobin: 11.9 g/dL — ABNORMAL LOW (ref 12.0–15.0)
Potassium: 4.9 mEq/L (ref 3.5–5.1)
Sodium: 137 mEq/L (ref 135–145)
TCO2: 31 mmol/L (ref 0–100)

## 2012-04-23 LAB — CBC
HCT: 34.1 % — ABNORMAL LOW (ref 36.0–46.0)
Hemoglobin: 10.9 g/dL — ABNORMAL LOW (ref 12.0–15.0)
MCH: 22.6 pg — ABNORMAL LOW (ref 26.0–34.0)
MCHC: 32 g/dL (ref 30.0–36.0)
MCV: 70.7 fL — ABNORMAL LOW (ref 78.0–100.0)
Platelets: 249 10*3/uL (ref 150–400)
RBC: 4.82 MIL/uL (ref 3.87–5.11)
RDW: 16.8 % — ABNORMAL HIGH (ref 11.5–15.5)
WBC: 4.9 10*3/uL (ref 4.0–10.5)

## 2012-04-23 LAB — LIPASE, BLOOD: Lipase: 41 U/L (ref 11–59)

## 2012-04-23 LAB — GLUCOSE, CAPILLARY: Glucose-Capillary: 121 mg/dL — ABNORMAL HIGH (ref 70–99)

## 2012-04-23 MED ORDER — MORPHINE SULFATE 2 MG/ML IJ SOLN
2.0000 mg | Freq: Once | INTRAMUSCULAR | Status: AC
  Administered 2012-04-23: 2 mg via INTRAVENOUS
  Filled 2012-04-23: qty 1

## 2012-04-23 MED ORDER — DIVALPROEX SODIUM 500 MG PO DR TAB
500.0000 mg | DELAYED_RELEASE_TABLET | Freq: Once | ORAL | Status: AC
  Administered 2012-04-23: 500 mg via ORAL
  Filled 2012-04-23: qty 1

## 2012-04-23 NOTE — ED Provider Notes (Signed)
History     CSN: [id number]  Arrival date & time 04/23/12  1859   First MD Initiated Contact with Patient 04/23/12 2129      Chief Complaint  Patient presents with  . Seizures    (Consider location/radiation/quality/duration/timing/severity/associated sxs/prior treatment) Patient is a 66 y.o. female presenting with seizures. The history is provided by the patient and a relative. No language interpreter was used.  Seizures  This is a recurrent problem. The current episode started 3 to 5 hours ago. The problem has been resolved. There were 4 to 5 seizures. Associated symptoms include sleepiness. Pertinent negatives include patient does not experience confusion, no headaches, no visual disturbance, no chest pain, no nausea, no vomiting and no diarrhea. Characteristics include bowel incontinence, bladder incontinence, rhythmic jerking and loss of consciousness. The episode was witnessed. There was no sensation of an aura present. The seizures did not continue in the ED. The seizure(s) had upper extremity and lower extremity focality. Possible causes include med or dosage change.   66 year old female with known seizure disorder coming in after grand mal seizure x 4 today.  Sister states that she had 4 seizures throughout the day. Patient saw Dr. Silverio Decamp her neurologist at  [facility] on Friday. Her Lamictal 100mg  was increased to 1.5 pills at bedtime 5 days ago.  He also started her on depakote ER 500mg  HS. Patients last seizure was Friday in the office.   Patient also c/o epigastric pain from her chronic pancreatitis. Awake alert and oriented x 3 presently. MAE =. Incontinent of urine.  Bit her left cheek.  Prior patient of [facility] but referred to [facility].    Past Medical History  Diagnosis Date  . Myocardial infarction   . Respiratory failure      multifactorial secondary to congestive heart failure, morbid obesity, decreased abdominal  compliance in the setting of pancreatitis.  .  Diastolic heart failure   . Leukocytosis     secondary to steroid and pancreatitis.  . DM (diabetes mellitus)   . HTN (hypertension)   . Seizure disorder   . Hyperlipidemia   . Back pain   . Obstructive sleep apnea   . Empty sella     on MRI in 2009.  . Diverticulitis   . Abnormal liver function      in the past.  . Headache   . Pneumonia   . Stroke     Past Surgical History  Procedure Date  . Cholecystectomy   . Abdominal hysterectomy   . Tubal ligation   . Tonsillectomy   . Appendectomy   . Cardiac catheterization     jan 2012  . Rectal polypectomy     Family History  Problem Relation Age of Onset  . Allergies Father   . Heart disease Father     before 66  . Heart failure Father   . Hypertension Father   . Hyperlipidemia Father   . Heart disease Mother   . Diabetes Mother   . Hypertension Mother   . Hyperlipidemia Mother   . Hypertension Sister   . Heart disease Sister     before 44  . Hyperlipidemia Sister     History  Substance Use Topics  . Smoking status: Former Smoker -- 0.5 packs/day for 25 years    Types: Cigarettes    Quit date: 03/21/1982  . Smokeless tobacco: Never Used  . Alcohol Use: No    OB History    Grav Para Term Preterm Abortions TAB SAB  Ect Mult Living                  Review of Systems  Constitutional: Negative.   HENT: Negative.   Eyes: Negative.  Negative for visual disturbance.  Respiratory: Negative.   Cardiovascular: Negative.  Negative for chest pain.  Gastrointestinal: Positive for bowel incontinence. Negative for nausea, vomiting and diarrhea.  Genitourinary: Positive for bladder incontinence.  Neurological: Positive for seizures and loss of consciousness. Negative for headaches.  Psychiatric/Behavioral: Negative.  Negative for confusion.  All other systems reviewed and are negative.    Allergies  Codeine; Penicillins; and Sulfa antibiotics  Home Medications   Current Outpatient Rx  Name Route Sig  Dispense Refill  . ALBUTEROL SULFATE HFA 108 (90 BASE) MCG/ACT IN AERS Inhalation Inhale 2 puffs into the lungs every 4 (four) hours as needed for wheezing or shortness of breath. 1 Inhaler 5  . ARIPIPRAZOLE 10 MG PO TABS Oral Take 10 mg by mouth daily.    . ASPIRIN EC 81 MG PO TBEC Oral Take 81 mg by mouth daily.    . ATORVASTATIN CALCIUM 10 MG PO TABS Oral Take 10 mg by mouth daily.    . CHOLESTYRAMINE 4 GM/DOSE PO POWD Oral Take 4 g by mouth 2 (two) times daily as needed. For diarrhea.    Marland Kitchen DIVALPROEX SODIUM ER 500 MG PO TB24 Oral Take 1,000 mg by mouth daily.    Marland Kitchen FERROUS GLUCONATE 324 (38 FE) MG PO TABS Oral Take 324 mg by mouth daily.     . FUROSEMIDE 40 MG PO TABS Oral Take 40 mg by mouth daily.     Marland Kitchen GABAPENTIN 300 MG PO CAPS Oral Take 300 mg by mouth 2 (two) times daily.     . INSULIN DETEMIR 100 UNIT/ML [place] SOLN Subcutaneous Inject 30 Units into the skin at bedtime.    . ISOSORBIDE DINITRATE 30 MG PO TABS Oral Take 30 mg by mouth daily.    Marland Kitchen LAMOTRIGINE 100 MG PO TABS Oral Take 100-150 mg by mouth 2 (two) times daily. 1 tab in am, 1.5 tab in pm    . LIRAGLUTIDE 18 MG/3ML [place] SOLN Subcutaneous Inject 1.2 mg into the skin daily.    Marland Kitchen METFORMIN HCL 500 MG PO TABS Oral Take 500 mg by mouth 3 (three) times daily.     . ADULT MULTIVITAMIN W/MINERALS CH Oral Take 1 tablet by mouth daily.    . NEBIVOLOL HCL 10 MG PO TABS Oral Take 10 mg by mouth daily.      Marland Kitchen OLMESARTAN MEDOXOMIL 40 MG PO TABS Oral Take 40 mg by mouth daily.    Marland Kitchen ONDANSETRON 8 MG PO TBDP Oral Take 8 mg by mouth every 12 (twelve) hours as needed. For nausea.    Marland Kitchen PANCRELIPASE (LIP-PROT-AMYL) 25000 UNITS PO CPEP Oral Take 2 capsules by mouth as directed. 2 capsules with meals and 1 with snacks    . POTASSIUM CHLORIDE CRYS ER 20 MEQ PO TBCR Oral Take 20 mEq by mouth daily.      . SERTRALINE HCL 100 MG PO TABS Oral Take 100 mg by mouth daily.     Marland Kitchen FREESTYLE LITE TEST VI STRP        BP 102/50  Pulse 68  Temp 98.9 F (37.2 C)  (Oral)  Resp 13  SpO2 98%  Physical Exam  Nursing note and vitals reviewed. Constitutional: She is oriented to person, place, and time. She appears well-developed and well-nourished.  HENT:  Head: Normocephalic and atraumatic.  Eyes: Conjunctivae normal and EOM are normal. Pupils are equal, round, and reactive to light.  Neck: Normal range of motion. Neck supple.  Cardiovascular: Normal rate, regular rhythm, normal heart sounds and intact distal pulses.  Exam reveals no gallop and no friction rub.   No murmur heard. Pulmonary/Chest: Effort normal and breath sounds normal.  Abdominal: Soft. Bowel sounds are normal. She exhibits no distension. There is tenderness.       Epigastric tenderness  Genitourinary:       Incontinent of urine.  Musculoskeletal: Normal range of motion. She exhibits no edema and no tenderness.  Neurological: She is alert and oriented to person, place, and time. She has normal reflexes. No cranial nerve deficit. Coordination normal.  Skin: Skin is warm and dry.  Psychiatric: She has a normal mood and affect.    ED Course  Procedures (including critical care time)  Spoke with Dr Rito Ehrlich at [facility]. Recommends no med changes with close follow up tomorrow.     Labs Reviewed  CBC - Abnormal; Notable for the following:    Hemoglobin 10.9 (*)     HCT 34.1 (*)     MCV 70.7 (*)     MCH 22.6 (*)     RDW 16.8 (*)     All other components within normal limits  POCT I-STAT, CHEM 8 - Abnormal; Notable for the following:    Glucose, Bld 140 (*)     Calcium, Ion 1.09 (*)     Hemoglobin 11.9 (*)     HCT 35.0 (*)     All other components within normal limits  LIPASE, BLOOD   No results found.   No diagnosis found.    MDM  66yo female with known seizure disorder.  Under the care of Dr. Tera Helper at [facility] with recent visit.  Valproate acid level pending.  No more seizure activity in the ER.  Pm dose of Valproate acid given in the ER. Labs unremarkable  including lipase for her chronic pancreatic pain.  Patient and family understand to follow up tomorrow with Dr. Tera Helper tomorrow and return if worsening symptoms.  Ready for discharge.  Labs Reviewed  CBC - Abnormal; Notable for the following:    Hemoglobin 10.9 (*)     HCT 34.1 (*)     MCV 70.7 (*)     MCH 22.6 (*)     RDW 16.8 (*)     All other components within normal limits  POCT I-STAT, CHEM 8 - Abnormal; Notable for the following:    Glucose, Bld 140 (*)     Calcium, Ion 1.09 (*)     Hemoglobin 11.9 (*)     HCT 35.0 (*)     All other components within normal limits  GLUCOSE, CAPILLARY - Abnormal; Notable for the following:    Glucose-Capillary 121 (*)     All other components within normal limits  LIPASE, BLOOD  VALPROIC ACID LEVEL  LAB REPORT - SCANNED          Remi Haggard, NP 04/24/12 1657

## 2012-04-23 NOTE — ED Notes (Signed)
Pt in from home, c/o seizures. Had one lasting one minute this am, not transported. This evening has had two more. Reports being compliant with her seizure meds. Focal seizure noted by ems. Received 2.5mg  versed en route. No additional symptoms noted. Pt remains slightly post-ictal, but easily arousable. Denies pain. Family witnessed seizures, eased pt to floor, did not hit head.

## 2012-04-23 NOTE — ED Notes (Signed)
Bedside report received from previous RN. Pt lethargic but arousable.

## 2012-04-24 LAB — VALPROIC ACID LEVEL: Valproic Acid Lvl: 57.8 ug/mL (ref 50.0–100.0)

## 2012-04-27 NOTE — ED Provider Notes (Signed)
Medical screening examination/treatment/procedure(s) were performed by non-physician practitioner and as supervising physician I was immediately available for consultation/collaboration.  Raeford Razor, MD 04/27/12 1930

## 2012-04-29 ENCOUNTER — Emergency Department (HOSPITAL_COMMUNITY): Payer: Medicare HMO

## 2012-04-29 ENCOUNTER — Encounter (HOSPITAL_COMMUNITY): Payer: Self-pay | Admitting: Emergency Medicine

## 2012-04-29 ENCOUNTER — Emergency Department (HOSPITAL_COMMUNITY)
Admission: EM | Admit: 2012-04-29 | Discharge: 2012-04-29 | Disposition: A | Discharge status: Home / Self Care | Payer: Medicare HMO | Attending: Emergency Medicine | Admitting: Emergency Medicine

## 2012-04-29 DIAGNOSIS — E119 Type 2 diabetes mellitus without complications: Secondary | ICD-10-CM | POA: Insufficient documentation

## 2012-04-29 DIAGNOSIS — I6350 Cerebral infarction due to unspecified occlusion or stenosis of unspecified cerebral artery: Secondary | ICD-10-CM | POA: Insufficient documentation

## 2012-04-29 DIAGNOSIS — R569 Unspecified convulsions: Secondary | ICD-10-CM

## 2012-04-29 DIAGNOSIS — Z79899 Other long term (current) drug therapy: Secondary | ICD-10-CM | POA: Insufficient documentation

## 2012-04-29 DIAGNOSIS — Z8739 Personal history of other diseases of the musculoskeletal system and connective tissue: Secondary | ICD-10-CM | POA: Insufficient documentation

## 2012-04-29 DIAGNOSIS — Z87898 Personal history of other specified conditions: Secondary | ICD-10-CM | POA: Insufficient documentation

## 2012-04-29 DIAGNOSIS — J9600 Acute respiratory failure, unspecified whether with hypoxia or hypercapnia: Secondary | ICD-10-CM | POA: Insufficient documentation

## 2012-04-29 DIAGNOSIS — G4733 Obstructive sleep apnea (adult) (pediatric): Secondary | ICD-10-CM | POA: Insufficient documentation

## 2012-04-29 DIAGNOSIS — Z7982 Long term (current) use of aspirin: Secondary | ICD-10-CM | POA: Insufficient documentation

## 2012-04-29 DIAGNOSIS — G40909 Epilepsy, unspecified, not intractable, without status epilepticus: Secondary | ICD-10-CM | POA: Insufficient documentation

## 2012-04-29 DIAGNOSIS — I10 Essential (primary) hypertension: Secondary | ICD-10-CM | POA: Insufficient documentation

## 2012-04-29 DIAGNOSIS — I5030 Unspecified diastolic (congestive) heart failure: Secondary | ICD-10-CM | POA: Insufficient documentation

## 2012-04-29 DIAGNOSIS — Z8701 Personal history of pneumonia (recurrent): Secondary | ICD-10-CM | POA: Insufficient documentation

## 2012-04-29 DIAGNOSIS — I252 Old myocardial infarction: Secondary | ICD-10-CM | POA: Insufficient documentation

## 2012-04-29 DIAGNOSIS — Z87891 Personal history of nicotine dependence: Secondary | ICD-10-CM | POA: Insufficient documentation

## 2012-04-29 DIAGNOSIS — Z8719 Personal history of other diseases of the digestive system: Secondary | ICD-10-CM | POA: Insufficient documentation

## 2012-04-29 DIAGNOSIS — D72829 Elevated white blood cell count, unspecified: Secondary | ICD-10-CM | POA: Insufficient documentation

## 2012-04-29 DIAGNOSIS — E785 Hyperlipidemia, unspecified: Secondary | ICD-10-CM | POA: Insufficient documentation

## 2012-04-29 DIAGNOSIS — Z794 Long term (current) use of insulin: Secondary | ICD-10-CM | POA: Insufficient documentation

## 2012-04-29 HISTORY — DX: Unspecified convulsions: R56.9

## 2012-04-29 LAB — CBC WITH DIFFERENTIAL/PLATELET
Basophils Absolute: 0 10*3/uL (ref 0.0–0.1)
Basophils Relative: 0 % (ref 0–1)
Eosinophils Absolute: 0.1 10*3/uL (ref 0.0–0.7)
Eosinophils Relative: 2 % (ref 0–5)
HCT: 37.2 % (ref 36.0–46.0)
Hemoglobin: 11.9 g/dL — ABNORMAL LOW (ref 12.0–15.0)
Lymphocytes Relative: 43 % (ref 12–46)
Lymphs Abs: 2.8 10*3/uL (ref 0.7–4.0)
MCH: 22.8 pg — ABNORMAL LOW (ref 26.0–34.0)
MCHC: 32 g/dL (ref 30.0–36.0)
MCV: 71.1 fL — ABNORMAL LOW (ref 78.0–100.0)
Monocytes Absolute: 0.4 10*3/uL (ref 0.1–1.0)
Monocytes Relative: 6 % (ref 3–12)
Neutro Abs: 3.2 10*3/uL (ref 1.7–7.7)
Neutrophils Relative %: 49 % (ref 43–77)
Platelets: 291 10*3/uL (ref 150–400)
RBC: 5.23 MIL/uL — ABNORMAL HIGH (ref 3.87–5.11)
RDW: 17.1 % — ABNORMAL HIGH (ref 11.5–15.5)
WBC: 6.6 10*3/uL (ref 4.0–10.5)

## 2012-04-29 LAB — VALPROIC ACID LEVEL: Valproic Acid Lvl: 66 ug/mL (ref 50.0–100.0)

## 2012-04-29 LAB — BASIC METABOLIC PANEL WITH GFR
BUN: 13 mg/dL (ref 6–23)
CO2: 30 meq/L (ref 19–32)
Calcium: 9.5 mg/dL (ref 8.4–10.5)
Chloride: 96 meq/L (ref 96–112)
Creatinine, Ser: 0.85 mg/dL (ref 0.50–1.10)
GFR calc Af Amer: 82 mL/min — ABNORMAL LOW
GFR calc non Af Amer: 71 mL/min — ABNORMAL LOW
Glucose, Bld: 148 mg/dL — ABNORMAL HIGH (ref 70–99)
Potassium: 4.1 meq/L (ref 3.5–5.1)
Sodium: 136 meq/L (ref 135–145)

## 2012-04-29 LAB — GLUCOSE, CAPILLARY: Glucose-Capillary: 137 mg/dL — ABNORMAL HIGH (ref 70–99)

## 2012-04-29 MED ORDER — LORAZEPAM 2 MG/ML IJ SOLN
2.0000 mg | Freq: Once | INTRAMUSCULAR | Status: AC
  Administered 2012-04-29: 2 mg via INTRAVENOUS
  Filled 2012-04-29: qty 1

## 2012-04-29 NOTE — ED Notes (Signed)
Family at bedside. 

## 2012-04-29 NOTE — ED Notes (Signed)
MD at bedside. 

## 2012-04-29 NOTE — ED Notes (Addendum)
Left eyelid swollen. Sister states she was here at [facility] Thursday night post seizure. Family told by EMS to put pt on the floor, and believed that something bit her.   Sister states she saw a pill in her pocketbook the Divalproex ER-500mg  HS  Believed that pt. Took medications tonight. Pt states she took medications tonight.

## 2012-04-29 NOTE — ED Notes (Signed)
Pt ambulated in hall sleepy but easily aroused walked 25 feet,became more awake

## 2012-04-29 NOTE — ED Provider Notes (Signed)
History     CSN: [id number]  Arrival date & time 04/29/12  0137   First MD Initiated Contact with Patient 04/29/12 0143      Chief Complaint  Patient presents with  . Seizures    (Consider location/radiation/quality/duration/timing/severity/associated sxs/prior treatment) HPI History provided by pt.   Pt in ED as a visitor this morning when she had three generalized tonic-clonic seizures, each reportedly lasting 3-5 minutes.  Her nephew states that they were typical in appearance.  Her sister states that she was being treated by a neurologist at [facility], has been compliant w/ lamictal and neurontin, and her seizures were well controlled until approx 1 month ago.  She is now having seizures much more frequently have having them back to back.  She attributes to emotional stress.  No known recent head trauma and no recent illnesses other than a cough.    Past Medical History  Diagnosis Date  . Myocardial infarction   . Respiratory failure      multifactorial secondary to congestive heart failure, morbid obesity, decreased abdominal  compliance in the setting of pancreatitis.  . Diastolic heart failure   . Leukocytosis     secondary to steroid and pancreatitis.  . DM (diabetes mellitus)   . HTN (hypertension)   . Seizure disorder   . Hyperlipidemia   . Back pain   . Obstructive sleep apnea   . Empty sella     on MRI in 2009.  . Diverticulitis   . Abnormal liver function      in the past.  . Headache   . Pneumonia   . Stroke   . Seizures     Past Surgical History  Procedure Date  . Cholecystectomy   . Abdominal hysterectomy   . Tubal ligation   . Tonsillectomy   . Appendectomy   . Cardiac catheterization     jan 2012  . Rectal polypectomy     Family History  Problem Relation Age of Onset  . Allergies Father   . Heart disease Father     before 66  . Heart failure Father   . Hypertension Father   . Hyperlipidemia Father   . Heart disease Mother   . Diabetes  Mother   . Hypertension Mother   . Hyperlipidemia Mother   . Hypertension Sister   . Heart disease Sister     before 7  . Hyperlipidemia Sister     History  Substance Use Topics  . Smoking status: Former Smoker -- 0.5 packs/day for 25 years    Types: Cigarettes    Quit date: 03/21/1982  . Smokeless tobacco: Never Used  . Alcohol Use: No    OB History    Grav Para Term Preterm Abortions TAB SAB Ect Mult Living                  Review of Systems  All other systems reviewed and are negative.    Allergies  Codeine; Penicillins; and Sulfa antibiotics  Home Medications   Current Outpatient Rx  Name  Route  Sig  Dispense  Refill  . ALBUTEROL SULFATE HFA 108 (90 BASE) MCG/ACT IN AERS   Inhalation   Inhale 2 puffs into the lungs every 4 (four) hours as needed for wheezing or shortness of breath.   1 Inhaler   5   . ARIPIPRAZOLE 10 MG PO TABS   Oral   Take 10 mg by mouth daily.         Marland Kitchen  ASPIRIN EC 81 MG PO TBEC   Oral   Take 81 mg by mouth daily.         . ATORVASTATIN CALCIUM 10 MG PO TABS   Oral   Take 10 mg by mouth daily.         . CHOLESTYRAMINE 4 GM/DOSE PO POWD   Oral   Take 4 g by mouth 2 (two) times daily as needed. For diarrhea.         Marland Kitchen DIVALPROEX SODIUM ER 500 MG PO TB24   Oral   Take 1,000 mg by mouth daily.         Marland Kitchen FERROUS GLUCONATE 324 (38 FE) MG PO TABS   Oral   Take 324 mg by mouth daily.          Marland Kitchen FREESTYLE LITE TEST VI STRP               . FUROSEMIDE 40 MG PO TABS   Oral   Take 40 mg by mouth daily.          Marland Kitchen GABAPENTIN 300 MG PO CAPS   Oral   Take 300 mg by mouth 2 (two) times daily.          . INSULIN DETEMIR 100 UNIT/ML [place] SOLN   Subcutaneous   Inject 30 Units into the skin at bedtime.         . ISOSORBIDE DINITRATE 30 MG PO TABS   Oral   Take 30 mg by mouth daily.         Marland Kitchen LAMOTRIGINE 100 MG PO TABS   Oral   Take 100-150 mg by mouth 2 (two) times daily. 1 tab in am, 1.5 tab in pm           . LIRAGLUTIDE 18 MG/3ML [place] SOLN   Subcutaneous   Inject 1.2 mg into the skin daily.         Marland Kitchen METFORMIN HCL 500 MG PO TABS   Oral   Take 500 mg by mouth 3 (three) times daily.          . ADULT MULTIVITAMIN W/MINERALS CH   Oral   Take 1 tablet by mouth daily.         . NEBIVOLOL HCL 10 MG PO TABS   Oral   Take 10 mg by mouth daily.           Marland Kitchen OLMESARTAN MEDOXOMIL 40 MG PO TABS   Oral   Take 40 mg by mouth daily.         Marland Kitchen ONDANSETRON 8 MG PO TBDP   Oral   Take 8 mg by mouth every 12 (twelve) hours as needed. For nausea.         Marland Kitchen PANCRELIPASE (LIP-PROT-AMYL) 25000 UNITS PO CPEP   Oral   Take 2 capsules by mouth as directed. 2 capsules with meals and 1 with snacks         . POTASSIUM CHLORIDE CRYS ER 20 MEQ PO TBCR   Oral   Take 20 mEq by mouth daily.           . SERTRALINE HCL 100 MG PO TABS   Oral   Take 100 mg by mouth daily.            BP 125/57  Pulse 73  Temp 97.8 F (36.6 C) (Oral)  Resp 15  SpO2 93%  Physical Exam  Nursing note and vitals reviewed. Constitutional: She is oriented to person, place,  and time. She appears well-developed and well-nourished. No distress.  HENT:  Head: Normocephalic and atraumatic.  Eyes:       Mild edema and erythema of left upper eyelid  Neck: Normal range of motion.  Cardiovascular: Normal rate, regular rhythm and intact distal pulses.   Pulmonary/Chest: Effort normal and breath sounds normal.  Musculoskeletal: Normal range of motion.  Neurological: She is alert and oriented to person, place, and time. No sensory deficit. Coordination normal.       Pt is drowsy but aroused easily.  CN 3-12 intact.  No nystagmus. Symmetric upper and lower extremity strength.       Skin: Skin is warm and dry. No rash noted.  Psychiatric: She has a normal mood and affect. Her behavior is normal.    ED Course  Procedures (including critical care time)  Labs Reviewed  BASIC METABOLIC PANEL - Abnormal; Notable for  the following:    Glucose, Bld 148 (*)     GFR calc non Af Amer 71 (*)     GFR calc Af Amer 82 (*)     All other components within normal limits  GLUCOSE, CAPILLARY - Abnormal; Notable for the following:    Glucose-Capillary 137 (*)     All other components within normal limits  CBC WITH DIFFERENTIAL   Ct Head Wo Contrast  04/29/2012  *RADIOLOGY REPORT*  Clinical Data: Two seizures this morning; left eyelid swelling.  CT HEAD WITHOUT CONTRAST  Technique:  Contiguous axial images were obtained from the base of the skull through the vertex without contrast.  Comparison: CT of the head and MRI of the brain performed 08/05/2011  Findings:  There is a thin region of increased attenuation underlying the right frontoparietal calvarium.  This measures up to 3 mm in thickness.  Though it most likely reflects volume averaging, a small subdural bleed cannot be excluded.  The posterior fossa, including the cerebellum, brainstem and fourth ventricle, is within normal limits.  The third and lateral ventricles, and basal ganglia are unremarkable in appearance.  The cerebral hemispheres are symmetric in appearance, with normal gray- white differentiation.  No mass effect or midline shift is seen.  There is no evidence of fracture; visualized osseous structures are unremarkable in appearance.  The visualized portions of the orbits are within normal limits.  The paranasal sinuses and mastoid air cells are well-aerated.  No significant soft tissue abnormalities are seen.  IMPRESSION: Thin region of increased attenuation underlying the right frontoparietal calvarium, measuring up to 3 mm in thickness. Though it most likely reflects volume averaging, a small subdural bleed cannot be excluded.  Critical Value/emergent results were called by telephone at the time of interpretation on 04/29/2012 at 05:55 a.m. to Ruby Cola, who verbally acknowledged these results.   Original Report Authenticated By: Tonia Ghent, M.D.       1. Seizure       MDM  [phone] F w/ h/o seizure disorder had 3 seizures while visiting another patient in ED this evening.  Family is concerned because seizures have been more frequent this past month and she is now having multiple at a time.  Initially denied recent head trauma but now report a fall w/ head impact 2 days ago as well as patient's recent complaint of headaches.  On exam, pt is post-ictal but no signs of head trauma and no focal neuro deficits.  Labs unremarkable, including depakote level.  CT head shows 3mm subdural bleed in frontoparietal region vs. Artifact.  Consulted Dr. Roseanne Reno w/ neurology, he has looked at images and is also unsure of whether or not patient has a subdural.  Dr. Patria Mane recommends having patient return to ER tomorrow for repeat CT.  Discussed w/ patient's family and they are agreeable to plan.  Will return sooner for severe headache, vomiting, vision changes, ect.  They will schedule f/u with [facility] today.          Otilio Miu, [place] 04/29/12 [phone]

## 2012-04-29 NOTE — ED Provider Notes (Addendum)
Medical screening examination/treatment/procedure(s) were conducted as a shared visit with non-physician practitioner(s) and myself.  I personally evaluated the patient during the encounter  Ct Head Wo Contrast  04/29/2012  *RADIOLOGY REPORT*  Clinical Data: Two seizures this morning; left eyelid swelling.  CT HEAD WITHOUT CONTRAST  Technique:  Contiguous axial images were obtained from the base of the skull through the vertex without contrast.  Comparison: CT of the head and MRI of the brain performed 08/05/2011  Findings:  There is a thin region of increased attenuation underlying the right frontoparietal calvarium.  This measures up to 3 mm in thickness.  Though it most likely reflects volume averaging, a small subdural bleed cannot be excluded.  The posterior fossa, including the cerebellum, brainstem and fourth ventricle, is within normal limits.  The third and lateral ventricles, and basal ganglia are unremarkable in appearance.  The cerebral hemispheres are symmetric in appearance, with normal gray- white differentiation.  No mass effect or midline shift is seen.  There is no evidence of fracture; visualized osseous structures are unremarkable in appearance.  The visualized portions of the orbits are within normal limits.  The paranasal sinuses and mastoid air cells are well-aerated.  No significant soft tissue abnormalities are seen.  IMPRESSION: Thin region of increased attenuation underlying the right frontoparietal calvarium, measuring up to 3 mm in thickness. Though it most likely reflects volume averaging, a small subdural bleed cannot be excluded.  Critical Value/emergent results were called by telephone at the time of interpretation on 04/29/2012 at 05:55 a.m. to Ruby Cola, who verbally acknowledged these results.   Original Report Authenticated By: Tonia Ghent, M.D.    I personally reviewed the imaging tests through PACS system I reviewed available ER/hospitalization records  thought the EMR  My suspicion is rather low this is a subdural hematoma.  She is on aspirin daily.  She will return to the ER in 24 hours for repeat CT imaging.  Otherwise close followup with her neurologist.  She's also been given the number for [facility] neurology as she would like to switch her neurology to [facility].  Family understands to return to the ER for new or worsening symptoms.  I suspect this is more artifact.  I discussed her case with the radiologist.  It is so subtle that he does not believe this will be demonstrated on MRI scan.  The case was also discussed with neurology  6:49 AM The patient is arousable but she still rather sleepy.  Think this is secondary to her denies pain she was given in the emergency department.  She'll be monitored in the emergency department to make sure she returns to baseline mental status.  She's been up the entire evening with her family member who is being seen in the ER at the time of her seizure.  Lyanne Co, MD 04/29/12 1610  Lyanne Co, MD 04/29/12 [phone]   8:01 AM The patient is much more awake.  To be discharged home at this time.  Lyanne Co, MD 04/29/12 [phone]

## 2012-04-29 NOTE — ED Notes (Signed)
Pt here visiting pt and had a seizure at the bedside  1st seizure lasted 5 minutes  2nd seizure witnessed by staff  Pt has hx of epilepsy

## 2012-04-29 NOTE — ED Notes (Signed)
Patient is resting comfortably. 

## 2012-04-29 NOTE — ED Notes (Signed)
Family reports that patient has fallen twice within one month and both times striking her head. Most recent fall was on Sunday, 04/26/12. She hit her head on a church bench. They also report that patient has been under a lot of stress: wedding plans and then breaking off an engagement.

## 2012-05-18 ENCOUNTER — Encounter (HOSPITAL_COMMUNITY): Payer: Self-pay | Admitting: *Deleted

## 2012-05-18 ENCOUNTER — Encounter (HOSPITAL_COMMUNITY): Payer: Self-pay | Admitting: Nurse Practitioner

## 2012-05-18 ENCOUNTER — Emergency Department (HOSPITAL_COMMUNITY)
Admission: EM | Admit: 2012-05-18 | Discharge: 2012-05-18 | Disposition: A | Discharge status: Home / Self Care | Payer: Medicare HMO | Attending: Emergency Medicine | Admitting: Emergency Medicine

## 2012-05-18 ENCOUNTER — Emergency Department (INDEPENDENT_AMBULATORY_CARE_PROVIDER_SITE_OTHER)
Admission: EM | Admit: 2012-05-18 | Discharge: 2012-05-18 | Disposition: A | Discharge status: Nursing Home (Includes ICF) | Payer: Medicare HMO | Source: Home / Self Care | Attending: Family Medicine | Admitting: Family Medicine

## 2012-05-18 ENCOUNTER — Other Ambulatory Visit: Payer: Self-pay

## 2012-05-18 DIAGNOSIS — Z23 Encounter for immunization: Secondary | ICD-10-CM

## 2012-05-18 DIAGNOSIS — S90934A Unspecified superficial injury of right lesser toe(s), initial encounter: Secondary | ICD-10-CM

## 2012-05-18 DIAGNOSIS — I5030 Unspecified diastolic (congestive) heart failure: Secondary | ICD-10-CM | POA: Insufficient documentation

## 2012-05-18 DIAGNOSIS — I10 Essential (primary) hypertension: Secondary | ICD-10-CM | POA: Insufficient documentation

## 2012-05-18 DIAGNOSIS — Z87891 Personal history of nicotine dependence: Secondary | ICD-10-CM | POA: Insufficient documentation

## 2012-05-18 DIAGNOSIS — Z8673 Personal history of transient ischemic attack (TIA), and cerebral infarction without residual deficits: Secondary | ICD-10-CM | POA: Insufficient documentation

## 2012-05-18 DIAGNOSIS — D72829 Elevated white blood cell count, unspecified: Secondary | ICD-10-CM | POA: Insufficient documentation

## 2012-05-18 DIAGNOSIS — Z8709 Personal history of other diseases of the respiratory system: Secondary | ICD-10-CM | POA: Insufficient documentation

## 2012-05-18 DIAGNOSIS — E785 Hyperlipidemia, unspecified: Secondary | ICD-10-CM | POA: Insufficient documentation

## 2012-05-18 DIAGNOSIS — Z8701 Personal history of pneumonia (recurrent): Secondary | ICD-10-CM | POA: Insufficient documentation

## 2012-05-18 DIAGNOSIS — Z8719 Personal history of other diseases of the digestive system: Secondary | ICD-10-CM | POA: Insufficient documentation

## 2012-05-18 DIAGNOSIS — G4733 Obstructive sleep apnea (adult) (pediatric): Secondary | ICD-10-CM | POA: Insufficient documentation

## 2012-05-18 DIAGNOSIS — Z8669 Personal history of other diseases of the nervous system and sense organs: Secondary | ICD-10-CM | POA: Insufficient documentation

## 2012-05-18 DIAGNOSIS — Z79899 Other long term (current) drug therapy: Secondary | ICD-10-CM | POA: Insufficient documentation

## 2012-05-18 DIAGNOSIS — R569 Unspecified convulsions: Secondary | ICD-10-CM

## 2012-05-18 DIAGNOSIS — G40909 Epilepsy, unspecified, not intractable, without status epilepticus: Secondary | ICD-10-CM | POA: Insufficient documentation

## 2012-05-18 DIAGNOSIS — I252 Old myocardial infarction: Secondary | ICD-10-CM | POA: Insufficient documentation

## 2012-05-18 DIAGNOSIS — Z7982 Long term (current) use of aspirin: Secondary | ICD-10-CM | POA: Insufficient documentation

## 2012-05-18 DIAGNOSIS — Z794 Long term (current) use of insulin: Secondary | ICD-10-CM | POA: Insufficient documentation

## 2012-05-18 DIAGNOSIS — S90936A Unspecified superficial injury of unspecified lesser toe(s), initial encounter: Secondary | ICD-10-CM

## 2012-05-18 DIAGNOSIS — S90929A Unspecified superficial injury of unspecified foot, initial encounter: Secondary | ICD-10-CM

## 2012-05-18 DIAGNOSIS — E119 Type 2 diabetes mellitus without complications: Secondary | ICD-10-CM | POA: Insufficient documentation

## 2012-05-18 HISTORY — DX: Acute pancreatitis without necrosis or infection, unspecified: K85.90

## 2012-05-18 LAB — URINALYSIS, ROUTINE W REFLEX MICROSCOPIC
Bilirubin Urine: NEGATIVE
Glucose, UA: NEGATIVE mg/dL
Hgb urine dipstick: NEGATIVE
Ketones, ur: NEGATIVE mg/dL
Leukocytes, UA: NEGATIVE
Nitrite: NEGATIVE
Protein, ur: NEGATIVE mg/dL
Specific Gravity, Urine: 1.007 (ref 1.005–1.030)
Urobilinogen, UA: 0.2 mg/dL (ref 0.0–1.0)
pH: 7.5 (ref 5.0–8.0)

## 2012-05-18 LAB — POCT I-STAT, CHEM 8
BUN: 9 mg/dL (ref 6–23)
Calcium, Ion: 1.15 mmol/L (ref 1.13–1.30)
Chloride: 100 mEq/L (ref 96–112)
Creatinine, Ser: 0.8 mg/dL (ref 0.50–1.10)
Glucose, Bld: 110 mg/dL — ABNORMAL HIGH (ref 70–99)
HCT: 33 % — ABNORMAL LOW (ref 36.0–46.0)
Hemoglobin: 11.2 g/dL — ABNORMAL LOW (ref 12.0–15.0)
Potassium: 3.4 mEq/L — ABNORMAL LOW (ref 3.5–5.1)
Sodium: 140 mEq/L (ref 135–145)
TCO2: 30 mmol/L (ref 0–100)

## 2012-05-18 LAB — VALPROIC ACID LEVEL: Valproic Acid Lvl: 56.7 ug/mL (ref 50.0–100.0)

## 2012-05-18 LAB — GLUCOSE, CAPILLARY: Glucose-Capillary: 145 mg/dL — ABNORMAL HIGH (ref 70–99)

## 2012-05-18 MED ORDER — SODIUM CHLORIDE 0.9 % IV SOLN
Freq: Once | INTRAVENOUS | Status: AC
  Administered 2012-05-18: 15:00:00 via INTRAVENOUS

## 2012-05-18 MED ORDER — TETANUS-DIPHTH-ACELL PERTUSSIS 5-2.5-18.5 LF-MCG/0.5 IM SUSP
INTRAMUSCULAR | Status: AC
  Filled 2012-05-18: qty 0.5

## 2012-05-18 MED ORDER — TETANUS-DIPHTH-ACELL PERTUSSIS 5-2.5-18.5 LF-MCG/0.5 IM SUSP
0.5000 mL | Freq: Once | INTRAMUSCULAR | Status: AC
  Administered 2012-05-18: 0.5 mL via INTRAMUSCULAR

## 2012-05-18 MED ORDER — LORAZEPAM 2 MG/ML IJ SOLN
1.0000 mg | Freq: Once | INTRAMUSCULAR | Status: AC
  Administered 2012-05-18: 1 mg via INTRAVENOUS

## 2012-05-18 MED ORDER — LORAZEPAM 2 MG/ML IJ SOLN
INTRAMUSCULAR | Status: AC
  Filled 2012-05-18: qty 1

## 2012-05-18 MED ORDER — SODIUM CHLORIDE 0.9 % IV BOLUS (SEPSIS)
1000.0000 mL | Freq: Once | INTRAVENOUS | Status: AC
  Administered 2012-05-18: 1000 mL via INTRAVENOUS

## 2012-05-18 NOTE — ED Notes (Signed)
States was moving piece of furniture yesterday when she slipped forward scraping right great toe on carpet.  Wound continues to bleed.  Has been applying gauze and Bandaids.  Small circular wound noted to bottom of toe with slight oozing of blood.

## 2012-05-18 NOTE — ED Notes (Signed)
Pt used bedpan. Output not measured.

## 2012-05-18 NOTE — ED Notes (Signed)
Pt hypotensive [name] PA notified. NS started per order

## 2012-05-18 NOTE — ED Notes (Signed)
Sabrina, CN, advised of pending transfer via [facility]; [facility] called, and is en route

## 2012-05-18 NOTE — ED Notes (Signed)
Patient awake.  Admits to HA.

## 2012-05-18 NOTE — ED Provider Notes (Signed)
Medical screening examination/treatment/procedure(s) were performed by non-physician practitioner and as supervising physician I was immediately available for consultation/collaboration.  Cheri Guppy, MD 05/18/12 2051

## 2012-05-18 NOTE — ED Provider Notes (Signed)
History     CSN: [id number]  Arrival date & time 05/18/12  1522   First MD Initiated Contact with Patient 05/18/12 1547      Chief Complaint  Patient presents with  . Seizures    (Consider location/radiation/quality/duration/timing/severity/associated sxs/prior treatment) HPI The patient presents to the emergency department, following a seizure.  Patient was seen at the [facility] urgent care for a injury to her great toe on the right foot.  She was noticed to have seizure activity while in their care.  Patient was given Ativan and transported to the [facility] emergency department.  Patient denies shortness of breath, chest pain, abdominal pain, nausea, vomiting, headache, diarrhea, fever, dizziness, syncope, or weakness.  Patient does say, that she's tired at this point. Past Medical History  Diagnosis Date  . Myocardial infarction   . Respiratory failure      multifactorial secondary to congestive heart failure, morbid obesity, decreased abdominal  compliance in the setting of pancreatitis.  . Diastolic heart failure   . Leukocytosis     secondary to steroid and pancreatitis.  . DM (diabetes mellitus)   . HTN (hypertension)   . Seizure disorder   . Hyperlipidemia   . Back pain   . Obstructive sleep apnea   . Empty sella     on MRI in 2009.  . Diverticulitis   . Abnormal liver function      in the past.  . Headache   . Pneumonia   . Stroke   . Seizures   . Pancreatitis     Past Surgical History  Procedure Date  . Cholecystectomy   . Abdominal hysterectomy   . Tubal ligation   . Tonsillectomy   . Appendectomy   . Cardiac catheterization     jan 2012  . Rectal polypectomy   . Eye cysts   . Back surgery     Family History  Problem Relation Age of Onset  . Allergies Father   . Heart disease Father     before 63  . Heart failure Father   . Hypertension Father   . Hyperlipidemia Father   . Heart disease Mother   . Diabetes Mother   . Hypertension Mother    . Hyperlipidemia Mother   . Hypertension Sister   . Heart disease Sister     before 78  . Hyperlipidemia Sister     History  Substance Use Topics  . Smoking status: Former Smoker -- 0.5 packs/day for 25 years    Types: Cigarettes    Quit date: 03/21/1982  . Smokeless tobacco: Never Used  . Alcohol Use: No    OB History    Grav Para Term Preterm Abortions TAB SAB Ect Mult Living                  Review of Systems All other systems negative except as documented in the HPI. All pertinent positives and negatives as reviewed in the HPI.  Allergies  Codeine; Penicillins; Sulfa antibiotics; and Other  Home Medications   Current Outpatient Rx  Name  Route  Sig  Dispense  Refill  . ALBUTEROL SULFATE HFA 108 (90 BASE) MCG/ACT IN AERS   Inhalation   Inhale 2 puffs into the lungs every 4 (four) hours as needed for wheezing or shortness of breath.   1 Inhaler   5   . ARIPIPRAZOLE 10 MG PO TABS   Oral   Take 10 mg by mouth daily.         Marland Kitchen  ASPIRIN EC 81 MG PO TBEC   Oral   Take 81 mg by mouth daily.         . ATORVASTATIN CALCIUM 10 MG PO TABS   Oral   Take 10 mg by mouth every evening.          . CHOLESTYRAMINE 4 GM/DOSE PO POWD   Oral   Take 4 g by mouth 2 (two) times daily as needed. For diarrhea.         Marland Kitchen DIVALPROEX SODIUM ER 500 MG PO TB24   Oral   Take 1,000 mg by mouth at bedtime.          Marland Kitchen FERROUS GLUCONATE 324 (38 FE) MG PO TABS   Oral   Take 324 mg by mouth every evening.          . FUROSEMIDE 40 MG PO TABS   Oral   Take 40 mg by mouth every evening.          Marland Kitchen GABAPENTIN 300 MG PO CAPS   Oral   Take 300-600 mg by mouth 2 (two) times daily. 300mg  in am and 600 mg in pm         . INSULIN DETEMIR 100 UNIT/ML [place] SOLN   Subcutaneous   Inject 30 Units into the skin every evening.          . ISOSORBIDE DINITRATE 30 MG PO TABS   Oral   Take 30 mg by mouth every morning.          Marland Kitchen LAMOTRIGINE 100 MG PO TABS   Oral   Take  100-150 mg by mouth 2 (two) times daily. 1 tab in am, 1.5 tab in pm         . LIRAGLUTIDE 18 MG/3ML [place] SOLN   Subcutaneous   Inject 1.2 mg into the skin daily with supper.          Marland Kitchen METFORMIN HCL 500 MG PO TABS   Oral   Take 500 mg by mouth 2 (two) times daily with a meal.          . ADULT MULTIVITAMIN W/MINERALS CH   Oral   Take 1 tablet by mouth daily.         . NEBIVOLOL HCL 10 MG PO TABS   Oral   Take 10 mg by mouth daily.           Marland Kitchen OLMESARTAN MEDOXOMIL 40 MG PO TABS   Oral   Take 40 mg by mouth daily.         Marland Kitchen ONDANSETRON 8 MG PO TBDP   Oral   Take 8 mg by mouth every 12 (twelve) hours as needed. For nausea.         Marland Kitchen PANCRELIPASE (LIP-PROT-AMYL) 25000 UNITS PO CPEP   Oral   Take 1-2 capsules by mouth as directed. 2 capsules with meals and 1 with snacks         . POTASSIUM CHLORIDE CRYS ER 20 MEQ PO TBCR   Oral   Take 20 mEq by mouth daily.           . SERTRALINE HCL 100 MG PO TABS   Oral   Take 100 mg by mouth daily.            BP 110/57  Pulse 67  Temp 98.3 F (36.8 C) (Oral)  Resp 14  SpO2 97%  Physical Exam  Vitals reviewed. Constitutional: She is oriented to person, place, and time.  She appears well-developed and well-nourished. No distress.  HENT:  Head: Normocephalic and atraumatic.  Mouth/Throat: Oropharynx is clear and moist.  Neck: Normal range of motion. Neck supple.  Cardiovascular: Normal rate, regular rhythm and normal heart sounds.  Exam reveals no gallop and no friction rub.   No murmur heard. Pulmonary/Chest: Effort normal and breath sounds normal. No respiratory distress.  Abdominal: Soft. Bowel sounds are normal.  Neurological: She is alert and oriented to person, place, and time. She exhibits normal muscle tone. Coordination normal.       Patient is somewhat tired appearing on exam.  Skin: Skin is warm and dry.  Psychiatric: She has a normal mood and affect. Her behavior is normal. Judgment and thought  content normal.    ED Course  Procedures (including critical care time)  Labs Reviewed  POCT I-STAT, CHEM 8 - Abnormal; Notable for the following:    Potassium 3.4 (*)     Glucose, Bld 110 (*)     Hemoglobin 11.2 (*)     HCT 33.0 (*)     All other components within normal limits  URINALYSIS, ROUTINE W REFLEX MICROSCOPIC  VALPROIC ACID LEVEL   Patient has been observed here in the emergency department over several hours and has returned to her baseline adventitious in a dispute with her neurologist, along with her primary care Dr. for further evaluation and recheck.  She is advised to return here as needed.    MDM  MDM Reviewed: nursing note and vitals Interpretation: labs            Carlyle Dolly, PA-C 05/18/12 2014

## 2012-05-18 NOTE — ED Notes (Signed)
Patient was discharged to waiting area when receptionist called out that pt was having Sz.  Pt sitting upright with tremors, non-verbal, unable to bear any weight.  Assisted onto gurney.  Seizure lasted approx 1 min.  Pt brought back to exam room.  Siderails padded.  Patient postictal.

## 2012-05-18 NOTE — ED Notes (Signed)
Pt has been placed on 2L O2 [place] and heart monitor; rate regular @ 75 bpm

## 2012-05-18 NOTE — ED Provider Notes (Signed)
Chief Complaint  Patient presents with  . Wound Check    History of Present Illness:   The patient is a 66 year old female with a 4 year history of recurring seizures. She was here today for an abrasion on her foot. This was treated and she was sent out to the waiting room and was waiting for her sister to drive her home when she experienced a tonic clonic grand mal seizure. She was sitting down this happened and did not fall out of the chair. She did not strike her head. The seizure lasted about 1 minute. There was no tongue biting and no incontinence. She was brought right back to the treatment area. IV fluids were started, she was placed on a monitor and oxygen. Thereafter she was lethargic but able to answer questions. She was able to move all her extremities well. She complained of some headache. She did not appear to be any distress. After the IV been started she experienced a second tonic-clonic grand mal seizure. This also lasted about a minute. She was given Ativan 1 mg IV and will be transferred to the emergency room via [facility].  Review of Systems:  Other than noted above, the patient denies any of the following symptoms: Systemic:  No fever, chills, fatigue, photophobia, stiff neck. Eye:  No redness, eye pain, discharge, blurred vision, or diplopia. ENT:  No nasal congestion, rhinorrhea, sinus pressure or pain, sneezing, earache, or sore throat.  No jaw claudication. Neuro:  No paresthesias, loss of consciousness, seizure activity, muscle weakness, trouble with coordination or gait, trouble speaking or swallowing. Psych:  No depression, anxiety or trouble sleeping.  PMFSH:  Past medical history, family history, social history, meds, and allergies were reviewed.  Physical Exam:   Vital signs:  BP 157/84  Pulse 75  Temp 97.9 F (36.6 C) (Axillary)  Resp 24  SpO2 97% General:  Alert and oriented, but lethargic.  In no distress. Eye:  Lids and conjunctivas normal.  PERRL,  Full EOMs.    ENT:  No cranial or facial tenderness to palpation. No intra oral lesions, pharynx clear, mucous membranes moist, dentition normal. Neck:  Supple, full ROM, no tenderness to palpation.  No adenopathy or mass.  Lungs: Clear to auscultation. Heart: Regular rhythm, no gallop or murmur. Abdomen: Soft, mild generalized tenderness to palpation, no guarding or rebound. Neuro:  Alert but lethargic and orented times 3.  Speech was clear, fluent, and appropriate.  Cranial nerves intact. No pronator drift, muscle strength normal.  Psych:  Normal affect.  Course in Urgent Care Center:   As documented above. She was given normal saline at 50 mL per hour, Ativan 1 mg IV, nasal oxygen, and monitored.  Assessment:  The primary encounter diagnosis was Injury of toe, right, superficial. A diagnosis of Seizure was also pertinent to this visit.  Plan:   1.  The following meds were prescribed:   New Prescriptions   No medications on file   2.  The patient was transferred to the emergency department by [facility] in stable condition.  Reuben Likes, MD 05/18/12 1455

## 2012-05-18 NOTE — ED Provider Notes (Signed)
History     CSN: [id number]  Arrival date & time 05/18/12  1241   First MD Initiated Contact with Patient 05/18/12 1328      Chief Complaint  Patient presents with  . Wound Check    (Consider location/radiation/quality/duration/timing/severity/associated sxs/prior treatment) Patient is a 66 y.o. female presenting with wound check. The history is provided by the patient.  Wound Check  She was treated in the ED yesterday (scraped right gt toe on carpet last eve, continues to bleed.). Previous treatment in the ED includes wound cleansing or irrigation. There has been no drainage from the wound. There is no redness present. There is no swelling present. The pain has no pain. She has no difficulty moving the affected extremity or digit.    Past Medical History  Diagnosis Date  . Myocardial infarction   . Respiratory failure      multifactorial secondary to congestive heart failure, morbid obesity, decreased abdominal  compliance in the setting of pancreatitis.  . Diastolic heart failure   . Leukocytosis     secondary to steroid and pancreatitis.  . DM (diabetes mellitus)   . HTN (hypertension)   . Seizure disorder   . Hyperlipidemia   . Back pain   . Obstructive sleep apnea   . Empty sella     on MRI in 2009.  . Diverticulitis   . Abnormal liver function      in the past.  . Headache   . Pneumonia   . Stroke   . Seizures   . Pancreatitis     Past Surgical History  Procedure Date  . Cholecystectomy   . Abdominal hysterectomy   . Tubal ligation   . Tonsillectomy   . Appendectomy   . Cardiac catheterization     jan 2012  . Rectal polypectomy   . Eye cysts   . Back surgery     Family History  Problem Relation Age of Onset  . Allergies Father   . Heart disease Father     before 69  . Heart failure Father   . Hypertension Father   . Hyperlipidemia Father   . Heart disease Mother   . Diabetes Mother   . Hypertension Mother   . Hyperlipidemia Mother   .  Hypertension Sister   . Heart disease Sister     before 24  . Hyperlipidemia Sister     History  Substance Use Topics  . Smoking status: Former Smoker -- 0.5 packs/day for 25 years    Types: Cigarettes    Quit date: 03/21/1982  . Smokeless tobacco: Never Used  . Alcohol Use: No    OB History    Grav Para Term Preterm Abortions TAB SAB Ect Mult Living                  Review of Systems  Constitutional: Negative.   Skin: Positive for wound.    Allergies  Codeine; Penicillins; and Sulfa antibiotics  Home Medications   Current Outpatient Rx  Name  Route  Sig  Dispense  Refill  . ALBUTEROL SULFATE HFA 108 (90 BASE) MCG/ACT IN AERS   Inhalation   Inhale 2 puffs into the lungs every 4 (four) hours as needed for wheezing or shortness of breath.   1 Inhaler   5   . ARIPIPRAZOLE 10 MG PO TABS   Oral   Take 10 mg by mouth daily.         . ASPIRIN EC 81 MG PO  TBEC   Oral   Take 81 mg by mouth daily.         . ATORVASTATIN CALCIUM 10 MG PO TABS   Oral   Take 10 mg by mouth every evening.          . CHOLESTYRAMINE 4 GM/DOSE PO POWD   Oral   Take 4 g by mouth 2 (two) times daily as needed. For diarrhea.         Marland Kitchen DIVALPROEX SODIUM ER 500 MG PO TB24   Oral   Take 1,000 mg by mouth at bedtime.          Marland Kitchen FERROUS GLUCONATE 324 (38 FE) MG PO TABS   Oral   Take 324 mg by mouth every evening.          . FUROSEMIDE 40 MG PO TABS   Oral   Take 40 mg by mouth every evening.          Marland Kitchen GABAPENTIN 300 MG PO CAPS   Oral   Take 300-600 mg by mouth 2 (two) times daily. 300mg  in am and 600 mg in pm         . INSULIN DETEMIR 100 UNIT/ML [place] SOLN   Subcutaneous   Inject 30 Units into the skin every evening.          . ISOSORBIDE DINITRATE 30 MG PO TABS   Oral   Take 30 mg by mouth every morning.          Marland Kitchen LAMOTRIGINE 100 MG PO TABS   Oral   Take 100-150 mg by mouth 2 (two) times daily. 1 tab in am, 1.5 tab in pm         . LIRAGLUTIDE 18 MG/3ML  [place] SOLN   Subcutaneous   Inject 1.2 mg into the skin daily with supper.          Marland Kitchen METFORMIN HCL 500 MG PO TABS   Oral   Take 500 mg by mouth 2 (two) times daily with a meal.          . ADULT MULTIVITAMIN W/MINERALS CH   Oral   Take 1 tablet by mouth daily.         . NEBIVOLOL HCL 10 MG PO TABS   Oral   Take 10 mg by mouth daily.           Marland Kitchen OLMESARTAN MEDOXOMIL 40 MG PO TABS   Oral   Take 40 mg by mouth daily.         Marland Kitchen ONDANSETRON 8 MG PO TBDP   Oral   Take 8 mg by mouth every 12 (twelve) hours as needed. For nausea.         Marland Kitchen PANCRELIPASE (LIP-PROT-AMYL) 25000 UNITS PO CPEP   Oral   Take 2 capsules by mouth as directed. 2 capsules with meals and 1 with snacks         . POTASSIUM CHLORIDE CRYS ER 20 MEQ PO TBCR   Oral   Take 20 mEq by mouth daily.           . SERTRALINE HCL 100 MG PO TABS   Oral   Take 100 mg by mouth daily.            BP 142/70  Pulse 77  Temp 98.4 F (36.9 C) (Oral)  Resp 17  SpO2 93%  Physical Exam  Nursing note and vitals reviewed. Constitutional: She is oriented to person, place, and time. She appears  well-developed and well-nourished.  Musculoskeletal: She exhibits no tenderness.  Neurological: She is alert and oriented to person, place, and time.  Skin: Skin is warm and dry.       sm skin avulsion on plantar surface of gt toe, no fb, no infection, nontender.    ED Course  Procedures (including critical care time)  Labs Reviewed - No data to display No results found.   1. Injury of toe, right, superficial       MDM          Linna Hoff, MD 05/18/12 1353

## 2012-05-18 NOTE — ED Notes (Signed)
Patient having another sz, lasted approx 1 min.

## 2012-05-18 NOTE — ED Notes (Signed)
Per [facility]. Pt was at [facility] for toe injury recheck. Pt. Had seizures x2 in [place]. Pt has had 1mg  of ativan. Vital signs 156/88 HR 76 NSR CBG 145

## 2012-05-18 NOTE — ED Notes (Signed)
Report given to [facility].  [facility] present to transport pt.

## 2012-05-19 ENCOUNTER — Encounter (HOSPITAL_COMMUNITY): Payer: Self-pay

## 2012-05-19 ENCOUNTER — Emergency Department (HOSPITAL_COMMUNITY)
Admission: EM | Admit: 2012-05-19 | Discharge: 2012-05-19 | Disposition: A | Discharge status: Home / Self Care | Payer: Medicare HMO | Attending: Emergency Medicine | Admitting: Emergency Medicine

## 2012-05-19 DIAGNOSIS — Z8639 Personal history of other endocrine, nutritional and metabolic disease: Secondary | ICD-10-CM | POA: Insufficient documentation

## 2012-05-19 DIAGNOSIS — I5030 Unspecified diastolic (congestive) heart failure: Secondary | ICD-10-CM | POA: Insufficient documentation

## 2012-05-19 DIAGNOSIS — Z7982 Long term (current) use of aspirin: Secondary | ICD-10-CM | POA: Insufficient documentation

## 2012-05-19 DIAGNOSIS — G40909 Epilepsy, unspecified, not intractable, without status epilepticus: Secondary | ICD-10-CM | POA: Insufficient documentation

## 2012-05-19 DIAGNOSIS — Z79899 Other long term (current) drug therapy: Secondary | ICD-10-CM | POA: Insufficient documentation

## 2012-05-19 DIAGNOSIS — R569 Unspecified convulsions: Secondary | ICD-10-CM

## 2012-05-19 DIAGNOSIS — G4733 Obstructive sleep apnea (adult) (pediatric): Secondary | ICD-10-CM | POA: Insufficient documentation

## 2012-05-19 DIAGNOSIS — E785 Hyperlipidemia, unspecified: Secondary | ICD-10-CM | POA: Insufficient documentation

## 2012-05-19 DIAGNOSIS — Z8719 Personal history of other diseases of the digestive system: Secondary | ICD-10-CM | POA: Insufficient documentation

## 2012-05-19 DIAGNOSIS — I10 Essential (primary) hypertension: Secondary | ICD-10-CM | POA: Insufficient documentation

## 2012-05-19 DIAGNOSIS — I252 Old myocardial infarction: Secondary | ICD-10-CM | POA: Insufficient documentation

## 2012-05-19 DIAGNOSIS — Z8709 Personal history of other diseases of the respiratory system: Secondary | ICD-10-CM | POA: Insufficient documentation

## 2012-05-19 DIAGNOSIS — K861 Other chronic pancreatitis: Secondary | ICD-10-CM | POA: Insufficient documentation

## 2012-05-19 DIAGNOSIS — Z87891 Personal history of nicotine dependence: Secondary | ICD-10-CM | POA: Insufficient documentation

## 2012-05-19 DIAGNOSIS — R404 Transient alteration of awareness: Secondary | ICD-10-CM | POA: Insufficient documentation

## 2012-05-19 DIAGNOSIS — Z8673 Personal history of transient ischemic attack (TIA), and cerebral infarction without residual deficits: Secondary | ICD-10-CM | POA: Insufficient documentation

## 2012-05-19 DIAGNOSIS — Z862 Personal history of diseases of the blood and blood-forming organs and certain disorders involving the immune mechanism: Secondary | ICD-10-CM | POA: Insufficient documentation

## 2012-05-19 DIAGNOSIS — Z8701 Personal history of pneumonia (recurrent): Secondary | ICD-10-CM | POA: Insufficient documentation

## 2012-05-19 DIAGNOSIS — E119 Type 2 diabetes mellitus without complications: Secondary | ICD-10-CM | POA: Insufficient documentation

## 2012-05-19 DIAGNOSIS — R51 Headache: Secondary | ICD-10-CM | POA: Insufficient documentation

## 2012-05-19 DIAGNOSIS — Z794 Long term (current) use of insulin: Secondary | ICD-10-CM | POA: Insufficient documentation

## 2012-05-19 LAB — VALPROIC ACID LEVEL: Valproic Acid Lvl: 83.1 ug/mL (ref 50.0–100.0)

## 2012-05-19 MED ORDER — ACETAMINOPHEN 325 MG PO TABS
650.0000 mg | ORAL_TABLET | Freq: Once | ORAL | Status: AC
  Administered 2012-05-19: 650 mg via ORAL
  Filled 2012-05-19: qty 2

## 2012-05-19 NOTE — ED Notes (Signed)
Patient taken out to car in wheelchair.

## 2012-05-19 NOTE — ED Provider Notes (Signed)
History     CSN: [id number]  Arrival date & time 05/19/12  1446   First MD Initiated Contact with Patient 05/19/12 1515      Chief Complaint  Patient presents with  . Seizures    (Consider location/radiation/quality/duration/timing/severity/associated sxs/prior treatment) Patient is a 66 y.o. female presenting with seizures.  Seizures  This is a chronic problem. The current episode started 1 to 2 hours ago. The problem has not changed since onset.There were 2 to 3 seizures. The most recent episode lasted less than 30 seconds. Associated symptoms include sleepiness (typical for her seizures) and headaches (typical for her seizures). Pertinent negatives include no confusion and no cough. Characteristics include rhythmic jerking and loss of consciousness. Characteristics do not include eye blinking, eye deviation, bowel incontinence, bladder incontinence, bit tongue or cyanosis. The episode was witnessed. The seizure(s) had right-sided focality. Possible causes do not include med or dosage change, sleep deprivation, missed seizure meds, recent illness or change in alcohol use. There has been no fever.    Past Medical History  Diagnosis Date  . Myocardial infarction   . Respiratory failure      multifactorial secondary to congestive heart failure, morbid obesity, decreased abdominal  compliance in the setting of pancreatitis.  . Diastolic heart failure   . Leukocytosis     secondary to steroid and pancreatitis.  . DM (diabetes mellitus)   . HTN (hypertension)   . Seizure disorder   . Hyperlipidemia   . Back pain   . Obstructive sleep apnea   . Empty sella     on MRI in 2009.  . Diverticulitis   . Abnormal liver function      in the past.  . Headache   . Pneumonia   . Stroke   . Seizures   . Pancreatitis     Past Surgical History  Procedure Date  . Cholecystectomy   . Abdominal hysterectomy   . Tubal ligation   . Tonsillectomy   . Appendectomy   . Cardiac  catheterization     jan 2012  . Rectal polypectomy   . Eye cysts   . Back surgery     Family History  Problem Relation Age of Onset  . Allergies Father   . Heart disease Father     before 14  . Heart failure Father   . Hypertension Father   . Hyperlipidemia Father   . Heart disease Mother   . Diabetes Mother   . Hypertension Mother   . Hyperlipidemia Mother   . Hypertension Sister   . Heart disease Sister     before 17  . Hyperlipidemia Sister     History  Substance Use Topics  . Smoking status: Former Smoker -- 0.5 packs/day for 25 years    Types: Cigarettes    Quit date: 03/21/1982  . Smokeless tobacco: Never Used  . Alcohol Use: No    OB History    Grav Para Term Preterm Abortions TAB SAB Ect Mult Living                  Review of Systems  Constitutional: Negative for fever.  Respiratory: Negative for cough and shortness of breath.   Cardiovascular: Negative for cyanosis.  Gastrointestinal: Negative for bowel incontinence.  Genitourinary: Negative for bladder incontinence.  Neurological: Positive for seizures, loss of consciousness and headaches (typical for her seizures).  Psychiatric/Behavioral: Negative for confusion.  All other systems reviewed and are negative.    Allergies  Codeine; Penicillins;  Sulfa antibiotics; and Other  Home Medications   Current Outpatient Rx  Name  Route  Sig  Dispense  Refill  . ALBUTEROL SULFATE HFA 108 (90 BASE) MCG/ACT IN AERS   Inhalation   Inhale 2 puffs into the lungs every 4 (four) hours as needed. For shortness of breath         . ARIPIPRAZOLE 10 MG PO TABS   Oral   Take 10 mg by mouth daily.         . ASPIRIN EC 81 MG PO TBEC   Oral   Take 81 mg by mouth daily.         . ATORVASTATIN CALCIUM 10 MG PO TABS   Oral   Take 10 mg by mouth every evening.          . CHOLESTYRAMINE 4 GM/DOSE PO POWD   Oral   Take 4 g by mouth 2 (two) times daily as needed. For diarrhea.         Marland Kitchen DIVALPROEX  SODIUM ER 500 MG PO TB24   Oral   Take 1,000 mg by mouth at bedtime.          Marland Kitchen FERROUS GLUCONATE 324 (38 FE) MG PO TABS   Oral   Take 324 mg by mouth every evening.          . FUROSEMIDE 40 MG PO TABS   Oral   Take 40 mg by mouth every evening.          Marland Kitchen GABAPENTIN 300 MG PO CAPS   Oral   Take 300-600 mg by mouth 2 (two) times daily. 300mg  in am and 600 mg in pm         . INSULIN DETEMIR 100 UNIT/ML [place] SOLN   Subcutaneous   Inject 30 Units into the skin every evening.          . ISOSORBIDE DINITRATE 30 MG PO TABS   Oral   Take 30 mg by mouth every morning.          Marland Kitchen LAMOTRIGINE 100 MG PO TABS   Oral   Take 100-150 mg by mouth 2 (two) times daily. 1 tab in am, 1.5 tab in pm         . LIRAGLUTIDE 18 MG/3ML [place] SOLN   Subcutaneous   Inject 1.2 mg into the skin daily with supper.          Marland Kitchen METFORMIN HCL 500 MG PO TABS   Oral   Take 500 mg by mouth 2 (two) times daily with a meal.          . ADULT MULTIVITAMIN W/MINERALS CH   Oral   Take 1 tablet by mouth daily.         . NEBIVOLOL HCL 10 MG PO TABS   Oral   Take 10 mg by mouth daily.           Marland Kitchen OLMESARTAN MEDOXOMIL 40 MG PO TABS   Oral   Take 40 mg by mouth daily.         Marland Kitchen ONDANSETRON 8 MG PO TBDP   Oral   Take 8 mg by mouth every 12 (twelve) hours as needed. For nausea.         Marland Kitchen PANCRELIPASE (LIP-PROT-AMYL) 25000 UNITS PO CPEP   Oral   Take 1-2 capsules by mouth as directed. 2 capsules with meals and 1 with snacks         . POTASSIUM CHLORIDE CRYS  ER 20 MEQ PO TBCR   Oral   Take 20 mEq by mouth daily.           . SERTRALINE HCL 100 MG PO TABS   Oral   Take 100 mg by mouth daily.            BP 137/59  Pulse 79  Temp 98.9 F (37.2 C) (Oral)  Resp 18  SpO2 97%  Physical Exam  Nursing note and vitals reviewed. Constitutional: She is oriented to person, place, and time. She appears well-developed and well-nourished. No distress.  HENT:  Head: Normocephalic and  atraumatic.  Eyes: EOM are normal. Pupils are equal, round, and reactive to light.  Neck: Normal range of motion. Neck supple.  Cardiovascular: Normal rate and regular rhythm.  Exam reveals no friction rub.   No murmur heard. Pulmonary/Chest: Effort normal and breath sounds normal. No respiratory distress. She has no wheezes. She has no rales.  Abdominal: Soft. She exhibits no distension. There is no tenderness. There is no rebound.  Musculoskeletal: Normal range of motion. She exhibits no edema.  Neurological: She is alert and oriented to person, place, and time. No cranial nerve deficit. She exhibits normal muscle tone. Coordination normal.  Skin: No rash noted. She is not diaphoretic.    ED Course  Procedures (including critical care time)   Labs Reviewed  VALPROIC ACID LEVEL   No results found.   1. Seizures       MDM   55F with hx of seizures presents with 3 seizures today. Here yesterday with seizure also. States no missed meds, sleep deprivation, illness. Back to baseline now, states tired and headache, which is typical post seizure. MAE equally, appears tired. AFVSS here. No focal neurologic deficits. Will check depakote level and replaced as needed. Has f/u with her Neurologist next week.  Depakote therapeutic. States she is feeling better on re-exam, discharged home in stable condition.         Elwin Mocha, MD 05/20/12 [phone]

## 2012-05-19 NOTE — ED Notes (Signed)
Pt here for seizure activity at [facility], x 3, focal seizures, sts arms were shaking, per report has had several over the last week. Pt manual bp was 200/100, negative stroke scale with ems, but had right side weakness in hand and foot. Pt complains of headache.

## 2012-05-21 NOTE — ED Provider Notes (Signed)
I saw and evaluated the patient, reviewed the resident's note and I agree with the findings and plan. Pt w hx szs, hx same. States had typical sz for her earlier. Current asymptomatic. No headache. No sz activity noted. Labs.   Suzi Roots, MD 05/21/12 [phone]

## 2012-07-01 ENCOUNTER — Ambulatory Visit: Payer: Medicare HMO | Admitting: Vascular Surgery

## 2012-07-15 ENCOUNTER — Emergency Department (HOSPITAL_COMMUNITY)
Admission: EM | Admit: 2012-07-15 | Discharge: 2012-07-15 | Disposition: A | Discharge status: Home / Self Care | Payer: Medicare HMO | Attending: Emergency Medicine | Admitting: Emergency Medicine

## 2012-07-15 ENCOUNTER — Emergency Department (HOSPITAL_COMMUNITY): Payer: Medicare HMO

## 2012-07-15 ENCOUNTER — Encounter (HOSPITAL_COMMUNITY): Payer: Self-pay | Admitting: Emergency Medicine

## 2012-07-15 DIAGNOSIS — Z79899 Other long term (current) drug therapy: Secondary | ICD-10-CM | POA: Insufficient documentation

## 2012-07-15 DIAGNOSIS — S8990XA Unspecified injury of unspecified lower leg, initial encounter: Secondary | ICD-10-CM | POA: Insufficient documentation

## 2012-07-15 DIAGNOSIS — R569 Unspecified convulsions: Secondary | ICD-10-CM

## 2012-07-15 DIAGNOSIS — Z8673 Personal history of transient ischemic attack (TIA), and cerebral infarction without residual deficits: Secondary | ICD-10-CM | POA: Insufficient documentation

## 2012-07-15 DIAGNOSIS — I5030 Unspecified diastolic (congestive) heart failure: Secondary | ICD-10-CM | POA: Insufficient documentation

## 2012-07-15 DIAGNOSIS — E119 Type 2 diabetes mellitus without complications: Secondary | ICD-10-CM | POA: Insufficient documentation

## 2012-07-15 DIAGNOSIS — Z862 Personal history of diseases of the blood and blood-forming organs and certain disorders involving the immune mechanism: Secondary | ICD-10-CM | POA: Insufficient documentation

## 2012-07-15 DIAGNOSIS — Z794 Long term (current) use of insulin: Secondary | ICD-10-CM | POA: Insufficient documentation

## 2012-07-15 DIAGNOSIS — Z8719 Personal history of other diseases of the digestive system: Secondary | ICD-10-CM | POA: Insufficient documentation

## 2012-07-15 DIAGNOSIS — S99919A Unspecified injury of unspecified ankle, initial encounter: Secondary | ICD-10-CM | POA: Insufficient documentation

## 2012-07-15 DIAGNOSIS — G40909 Epilepsy, unspecified, not intractable, without status epilepticus: Secondary | ICD-10-CM | POA: Insufficient documentation

## 2012-07-15 DIAGNOSIS — Y93B9 Activity, other involving muscle strengthening exercises: Secondary | ICD-10-CM | POA: Insufficient documentation

## 2012-07-15 DIAGNOSIS — I10 Essential (primary) hypertension: Secondary | ICD-10-CM | POA: Insufficient documentation

## 2012-07-15 DIAGNOSIS — E785 Hyperlipidemia, unspecified: Secondary | ICD-10-CM | POA: Insufficient documentation

## 2012-07-15 DIAGNOSIS — Y929 Unspecified place or not applicable: Secondary | ICD-10-CM | POA: Insufficient documentation

## 2012-07-15 DIAGNOSIS — G4733 Obstructive sleep apnea (adult) (pediatric): Secondary | ICD-10-CM | POA: Insufficient documentation

## 2012-07-15 DIAGNOSIS — Z8701 Personal history of pneumonia (recurrent): Secondary | ICD-10-CM | POA: Insufficient documentation

## 2012-07-15 DIAGNOSIS — R51 Headache: Secondary | ICD-10-CM | POA: Insufficient documentation

## 2012-07-15 DIAGNOSIS — M25569 Pain in unspecified knee: Secondary | ICD-10-CM

## 2012-07-15 DIAGNOSIS — W1809XA Striking against other object with subsequent fall, initial encounter: Secondary | ICD-10-CM | POA: Insufficient documentation

## 2012-07-15 DIAGNOSIS — I252 Old myocardial infarction: Secondary | ICD-10-CM | POA: Insufficient documentation

## 2012-07-15 LAB — CBC WITH DIFFERENTIAL/PLATELET
Basophils Absolute: 0 10*3/uL (ref 0.0–0.1)
Basophils Relative: 0 % (ref 0–1)
Eosinophils Absolute: 0.1 10*3/uL (ref 0.0–0.7)
Eosinophils Relative: 1 % (ref 0–5)
HCT: 37.3 % (ref 36.0–46.0)
Hemoglobin: 11.8 g/dL — ABNORMAL LOW (ref 12.0–15.0)
Lymphocytes Relative: 42 % (ref 12–46)
Lymphs Abs: 2.2 10*3/uL (ref 0.7–4.0)
MCH: 23.7 pg — ABNORMAL LOW (ref 26.0–34.0)
MCHC: 31.6 g/dL (ref 30.0–36.0)
MCV: 74.9 fL — ABNORMAL LOW (ref 78.0–100.0)
Monocytes Absolute: 0.4 10*3/uL (ref 0.1–1.0)
Monocytes Relative: 8 % (ref 3–12)
Neutro Abs: 2.6 10*3/uL (ref 1.7–7.7)
Neutrophils Relative %: 49 % (ref 43–77)
Platelets: 212 10*3/uL (ref 150–400)
RBC: 4.98 MIL/uL (ref 3.87–5.11)
RDW: 17.5 % — ABNORMAL HIGH (ref 11.5–15.5)
WBC: 5.4 10*3/uL (ref 4.0–10.5)

## 2012-07-15 LAB — COMPREHENSIVE METABOLIC PANEL
ALT: 8 U/L (ref 0–35)
AST: 15 U/L (ref 0–37)
Albumin: 3.1 g/dL — ABNORMAL LOW (ref 3.5–5.2)
Alkaline Phosphatase: 71 U/L (ref 39–117)
BUN: 11 mg/dL (ref 6–23)
CO2: 27 mEq/L (ref 19–32)
Calcium: 8.8 mg/dL (ref 8.4–10.5)
Chloride: 102 mEq/L (ref 96–112)
Creatinine, Ser: 0.84 mg/dL (ref 0.50–1.10)
GFR calc Af Amer: 83 mL/min — ABNORMAL LOW (ref >90)
GFR calc non Af Amer: 72 mL/min — ABNORMAL LOW (ref >90)
Glucose, Bld: 100 mg/dL — ABNORMAL HIGH (ref 70–99)
Potassium: 4.1 mEq/L (ref 3.5–5.1)
Sodium: 137 mEq/L (ref 135–145)
Total Bilirubin: 0.2 mg/dL — ABNORMAL LOW (ref 0.3–1.2)
Total Protein: 7.1 g/dL (ref 6.0–8.3)

## 2012-07-15 LAB — VALPROIC ACID LEVEL: Valproic Acid Lvl: 69.4 ug/mL (ref 50.0–100.0)

## 2012-07-15 MED ORDER — LORAZEPAM 2 MG/ML IJ SOLN
1.0000 mg | Freq: Once | INTRAMUSCULAR | Status: AC
  Administered 2012-07-15: 1 mg via INTRAVENOUS
  Filled 2012-07-15: qty 1

## 2012-07-15 NOTE — ED Provider Notes (Signed)
History     CSN: [id number]  Arrival date & time 07/15/12  1205   First MD Initiated Contact with Patient 07/15/12 1351      Chief Complaint  Patient presents with  . Seizures    HPI  The patient presents after a witnessed seizure.  She has a history of epilepsy, followed by a neurologist in [place].  Just prior to arrival the patient had a seizure that lasted approximately 30 seconds.  Subsequently had another seizure in route, and received midazolam.  On arrival the patient had one more similar events. On my exam the patient is awake, oriented, though slow to speak.  She states that she has no new complaints beyond right knee pain, and headache.  She has headaches after all of her seizures.  Prior to today's events the patient was exercised, and fell, striking her knee.  She states the knee pain is worse with motion. History of present illness is per the patient and her sister, who is the patient's primary caregiver. The patient's sister states that the patient has been in her usual state of health.  She continues to take all medication as directed.  No recent medication changes, diet changes, activity changes.   Past Medical History  Diagnosis Date  . Myocardial infarction   . Respiratory failure      multifactorial secondary to congestive heart failure, morbid obesity, decreased abdominal  compliance in the setting of pancreatitis.  . Diastolic heart failure   . Leukocytosis     secondary to steroid and pancreatitis.  . DM (diabetes mellitus)   . HTN (hypertension)   . Seizure disorder   . Hyperlipidemia   . Back pain   . Obstructive sleep apnea   . Empty sella     on MRI in 2009.  . Diverticulitis   . Abnormal liver function      in the past.  . Headache   . Pneumonia   . Stroke   . Seizures   . Pancreatitis     Past Surgical History  Procedure Date  . Cholecystectomy   . Abdominal hysterectomy   . Tubal ligation   . Tonsillectomy   . Appendectomy   .  Cardiac catheterization     jan 2012  . Rectal polypectomy   . Eye cysts   . Back surgery     Family History  Problem Relation Age of Onset  . Allergies Father   . Heart disease Father     before 54  . Heart failure Father   . Hypertension Father   . Hyperlipidemia Father   . Heart disease Mother   . Diabetes Mother   . Hypertension Mother   . Hyperlipidemia Mother   . Hypertension Sister   . Heart disease Sister     before 23  . Hyperlipidemia Sister     History  Substance Use Topics  . Smoking status: Former Smoker -- 0.5 packs/day for 25 years    Types: Cigarettes    Quit date: 03/21/1982  . Smokeless tobacco: Never Used  . Alcohol Use: No    OB History    Grav Para Term Preterm Abortions TAB SAB Ect Mult Living                  Review of Systems  Unable to perform ROS: Mental status change    Allergies  Codeine; Penicillins; Sulfa antibiotics; and Other  Home Medications   Current Outpatient Rx  Name  Route  Sig  Dispense  Refill  . ALBUTEROL SULFATE HFA 108 (90 BASE) MCG/ACT IN AERS   Inhalation   Inhale 2 puffs into the lungs every 4 (four) hours as needed. For shortness of breath         . ARIPIPRAZOLE 10 MG PO TABS   Oral   Take 10 mg by mouth daily.         . ASPIRIN EC 81 MG PO TBEC   Oral   Take 81 mg by mouth daily.         . ATORVASTATIN CALCIUM 10 MG PO TABS   Oral   Take 10 mg by mouth every evening.          . CHOLESTYRAMINE 4 GM/DOSE PO POWD   Oral   Take 4 g by mouth 2 (two) times daily as needed. For diarrhea.         Marland Kitchen DIVALPROEX SODIUM ER 500 MG PO TB24   Oral   Take 1,000 mg by mouth at bedtime.          Marland Kitchen FERROUS GLUCONATE 324 (38 FE) MG PO TABS   Oral   Take 324 mg by mouth every evening.          . FUROSEMIDE 40 MG PO TABS   Oral   Take 40 mg by mouth every evening.          Marland Kitchen GABAPENTIN 300 MG PO CAPS   Oral   Take 300-600 mg by mouth 2 (two) times daily. 300mg  in am and 600 mg in pm           . INSULIN DETEMIR 100 UNIT/ML [place] SOLN   Subcutaneous   Inject 30 Units into the skin every evening.          . ISOSORBIDE DINITRATE 30 MG PO TABS   Oral   Take 30 mg by mouth every morning.          Marland Kitchen LAMOTRIGINE 100 MG PO TABS   Oral   Take 100-150 mg by mouth 2 (two) times daily. 1 tab in am, 1.5 tab in pm         . LIRAGLUTIDE 18 MG/3ML [place] SOLN   Subcutaneous   Inject 1.2 mg into the skin daily with supper.          Marland Kitchen METFORMIN HCL 500 MG PO TABS   Oral   Take 500 mg by mouth 2 (two) times daily with a meal.          . ADULT MULTIVITAMIN W/MINERALS CH   Oral   Take 1 tablet by mouth daily.         . NEBIVOLOL HCL 10 MG PO TABS   Oral   Take 10 mg by mouth daily.           Marland Kitchen OLMESARTAN MEDOXOMIL 40 MG PO TABS   Oral   Take 40 mg by mouth daily.         Marland Kitchen PANCRELIPASE (LIP-PROT-AMYL) 25000 UNITS PO CPEP   Oral   Take 2 capsules by mouth 3 (three) times daily.          Marland Kitchen POTASSIUM CHLORIDE CRYS ER 20 MEQ PO TBCR   Oral   Take 20 mEq by mouth daily.           . SERTRALINE HCL 100 MG PO TABS   Oral   Take 100 mg by mouth daily.  BP 126/78  Pulse 74  Temp 98.8 F (37.1 C) (Oral)  Resp 20  SpO2 92%  Physical Exam  Nursing note and vitals reviewed. Constitutional: She is oriented to person, place, and time. She appears well-developed and well-nourished. No distress.       Slow motion, but patient is awake and alert, interacting otherwise appropriately  HENT:  Head: Normocephalic and atraumatic.       lacerations, no dental fractures, no facial trauma  Eyes: Conjunctivae normal and EOM are normal.  Cardiovascular: Normal rate and regular rhythm.   Pulmonary/Chest: Effort normal and breath sounds normal. No stridor. No respiratory distress.  Abdominal: She exhibits no distension.  Musculoskeletal: She exhibits no edema.       Legs: Neurological: She is alert and oriented to person, place, and time. No cranial nerve  deficit.  Skin: Skin is warm and dry.  Psychiatric: She has a normal mood and affect.   Immediately after my initial evaluation the patient had another seizure.  This lasted approximately 30 seconds, resolved without intervention. ED Course  Procedures (including critical care time)  Labs Reviewed  CBC WITH DIFFERENTIAL - Abnormal; Notable for the following:    Hemoglobin 11.8 (*)     MCV 74.9 (*)     MCH 23.7 (*)     RDW 17.5 (*)     All other components within normal limits  COMPREHENSIVE METABOLIC PANEL - Abnormal; Notable for the following:    Glucose, Bld 100 (*)     Albumin 3.1 (*)     Total Bilirubin 0.2 (*)     GFR calc non Af Amer 72 (*)     GFR calc Af Amer 83 (*)     All other components within normal limits  VALPROIC ACID LEVEL   Dg Chest 2 View  07/15/2012  *RADIOLOGY REPORT*  Clinical Data: Seizure, fall, chest pain  CHEST - 2 VIEW  Comparison: 10/28/2011  Findings: Cardiomegaly again noted.  No acute infiltrate or pleural effusion.  Central vascular congestion without pulmonary edema. Stable degenerative changes thoracic spine  IMPRESSION: .  Cardiomegaly.  No acute infiltrate.  Central vascular congestion without convincing pulmonary edema.   Original Report Authenticated By: Natasha Mead, M.D.    Dg Knee Complete 4 Views Right  07/15/2012  *RADIOLOGY REPORT*  Clinical Data: Fall, right knee pain  RIGHT KNEE - COMPLETE 4+ VIEW  Comparison: None.  Findings: Four views of the right knee submitted.  No acute fracture or subluxation.  Diffuse mild narrowing of joint space. There is spurring of medial tibial plateau and medial femoral condyle.  Minimal spurring of the lateral tibial plateau. Narrowing of patellofemoral joint space.  Spurring of patella.  IMPRESSION: No acute fracture or subluxation.  Degenerative changes as described above.   Original Report Authenticated By: Natasha Mead, M.D.      No diagnosis found.  Pulse ox 99% room air normal  4:04 PM Patient resting.   Her sister states that the patient awoke and was appropriately interactive for some time before falling asleep again.  MDM  This patient with a history of seizure disorder, multiple other medical problems now presents after several witnessed seizures.  On my exam the patient is initially postictal, but has another seizure in my presence.  The patient recovers from this, and has no ongoing deficits, nor evidence of acute illness.  The patient's vital signs and labs are reassuring.  The patient has a primary care physician and a neurologist with whom  she can followup.  Absent new findings, though she has new pain for which she will be referred to an orthopedist, she was discharged in stable condition.       Gerhard Munch, MD 07/15/12 [phone]

## 2012-07-15 NOTE — ED Notes (Signed)
Per EMS: Pt was at gym and walking the track around 1135 and had a seizure, witnessed pt lowered slowly to floor, did not hit head. Upon arrival by EMS pt had another 30 second seizure that manifested as jaw and arm shaking that resulted in generalized full body shaking. Pt had another seizure in route, the pt was given 2.5 midazolam, which resolved symptoms. Upon arrival pt the same symptoms and another 2.5 midazolam was given. Post-ictal pt is lethargic c/o headache and alert to self only.

## 2012-08-13 ENCOUNTER — Inpatient Hospital Stay (HOSPITAL_COMMUNITY)
Admission: EM | Admit: 2012-08-13 | Discharge: 2012-08-16 | DRG: 378 | Disposition: A | Discharge status: Home / Self Care | Payer: Medicare HMO | Attending: Internal Medicine | Admitting: Internal Medicine

## 2012-08-13 ENCOUNTER — Encounter (HOSPITAL_COMMUNITY): Payer: Self-pay | Admitting: Emergency Medicine

## 2012-08-13 DIAGNOSIS — K863 Pseudocyst of pancreas: Secondary | ICD-10-CM

## 2012-08-13 DIAGNOSIS — J9610 Chronic respiratory failure, unspecified whether with hypoxia or hypercapnia: Secondary | ICD-10-CM

## 2012-08-13 DIAGNOSIS — M5410 Radiculopathy, site unspecified: Secondary | ICD-10-CM

## 2012-08-13 DIAGNOSIS — I509 Heart failure, unspecified: Secondary | ICD-10-CM | POA: Diagnosis present

## 2012-08-13 DIAGNOSIS — D126 Benign neoplasm of colon, unspecified: Secondary | ICD-10-CM | POA: Diagnosis present

## 2012-08-13 DIAGNOSIS — I2510 Atherosclerotic heart disease of native coronary artery without angina pectoris: Secondary | ICD-10-CM

## 2012-08-13 DIAGNOSIS — K5731 Diverticulosis of large intestine without perforation or abscess with bleeding: Principal | ICD-10-CM | POA: Diagnosis present

## 2012-08-13 DIAGNOSIS — Z6841 Body Mass Index (BMI) 40.0 and over, adult: Secondary | ICD-10-CM

## 2012-08-13 DIAGNOSIS — I252 Old myocardial infarction: Secondary | ICD-10-CM

## 2012-08-13 DIAGNOSIS — E669 Obesity, unspecified: Secondary | ICD-10-CM

## 2012-08-13 DIAGNOSIS — K922 Gastrointestinal hemorrhage, unspecified: Secondary | ICD-10-CM

## 2012-08-13 DIAGNOSIS — K921 Melena: Secondary | ICD-10-CM | POA: Diagnosis present

## 2012-08-13 DIAGNOSIS — R569 Unspecified convulsions: Secondary | ICD-10-CM | POA: Diagnosis present

## 2012-08-13 DIAGNOSIS — I5032 Chronic diastolic (congestive) heart failure: Secondary | ICD-10-CM | POA: Diagnosis present

## 2012-08-13 DIAGNOSIS — D62 Acute posthemorrhagic anemia: Secondary | ICD-10-CM | POA: Diagnosis present

## 2012-08-13 DIAGNOSIS — J9602 Acute respiratory failure with hypercapnia: Secondary | ICD-10-CM

## 2012-08-13 DIAGNOSIS — M79606 Pain in leg, unspecified: Secondary | ICD-10-CM

## 2012-08-13 DIAGNOSIS — R609 Edema, unspecified: Secondary | ICD-10-CM

## 2012-08-13 DIAGNOSIS — I10 Essential (primary) hypertension: Secondary | ICD-10-CM | POA: Diagnosis present

## 2012-08-13 DIAGNOSIS — E785 Hyperlipidemia, unspecified: Secondary | ICD-10-CM

## 2012-08-13 DIAGNOSIS — E119 Type 2 diabetes mellitus without complications: Secondary | ICD-10-CM | POA: Diagnosis present

## 2012-08-13 DIAGNOSIS — R0789 Other chest pain: Secondary | ICD-10-CM | POA: Diagnosis present

## 2012-08-13 DIAGNOSIS — I110 Hypertensive heart disease with heart failure: Secondary | ICD-10-CM | POA: Diagnosis present

## 2012-08-13 DIAGNOSIS — E662 Morbid (severe) obesity with alveolar hypoventilation: Secondary | ICD-10-CM

## 2012-08-13 DIAGNOSIS — I83893 Varicose veins of bilateral lower extremities with other complications: Secondary | ICD-10-CM

## 2012-08-13 DIAGNOSIS — R079 Chest pain, unspecified: Secondary | ICD-10-CM | POA: Diagnosis present

## 2012-08-13 HISTORY — DX: Depression, unspecified: F32.A

## 2012-08-13 HISTORY — DX: Anemia, unspecified: D64.9

## 2012-08-13 HISTORY — DX: Major depressive disorder, single episode, unspecified: F32.9

## 2012-08-13 LAB — CBC WITH DIFFERENTIAL/PLATELET
Basophils Absolute: 0 10*3/uL (ref 0.0–0.1)
Basophils Relative: 0 % (ref 0–1)
Eosinophils Absolute: 0.1 10*3/uL (ref 0.0–0.7)
Eosinophils Relative: 2 % (ref 0–5)
HCT: 34.7 % — ABNORMAL LOW (ref 36.0–46.0)
Hemoglobin: 11.1 g/dL — ABNORMAL LOW (ref 12.0–15.0)
Lymphocytes Relative: 37 % (ref 12–46)
Lymphs Abs: 2.3 10*3/uL (ref 0.7–4.0)
MCH: 23.8 pg — ABNORMAL LOW (ref 26.0–34.0)
MCHC: 32 g/dL (ref 30.0–36.0)
MCV: 74.5 fL — ABNORMAL LOW (ref 78.0–100.0)
Monocytes Absolute: 0.4 10*3/uL (ref 0.1–1.0)
Monocytes Relative: 7 % (ref 3–12)
Neutro Abs: 3.3 10*3/uL (ref 1.7–7.7)
Neutrophils Relative %: 54 % (ref 43–77)
Platelets: 265 10*3/uL (ref 150–400)
RBC: 4.66 MIL/uL (ref 3.87–5.11)
RDW: 16.6 % — ABNORMAL HIGH (ref 11.5–15.5)
WBC: 6.1 10*3/uL (ref 4.0–10.5)

## 2012-08-13 LAB — COMPREHENSIVE METABOLIC PANEL
ALT: 19 U/L (ref 0–35)
AST: 19 U/L (ref 0–37)
Albumin: 3 g/dL — ABNORMAL LOW (ref 3.5–5.2)
Alkaline Phosphatase: 80 U/L (ref 39–117)
BUN: 16 mg/dL (ref 6–23)
CO2: 28 mEq/L (ref 19–32)
Calcium: 8.8 mg/dL (ref 8.4–10.5)
Chloride: 104 mEq/L (ref 96–112)
Creatinine, Ser: 1.08 mg/dL (ref 0.50–1.10)
GFR calc Af Amer: 62 mL/min — ABNORMAL LOW (ref >90)
GFR calc non Af Amer: 53 mL/min — ABNORMAL LOW (ref >90)
Glucose, Bld: 119 mg/dL — ABNORMAL HIGH (ref 70–99)
Potassium: 3.9 mEq/L (ref 3.5–5.1)
Sodium: 141 mEq/L (ref 135–145)
Total Bilirubin: 0.1 mg/dL — ABNORMAL LOW (ref 0.3–1.2)
Total Protein: 6.9 g/dL (ref 6.0–8.3)

## 2012-08-13 LAB — OCCULT BLOOD, POC DEVICE: Fecal Occult Bld: POSITIVE — AB

## 2012-08-13 LAB — GLUCOSE, CAPILLARY: Glucose-Capillary: 126 mg/dL — ABNORMAL HIGH (ref 70–99)

## 2012-08-13 NOTE — ED Notes (Signed)
Pt states she went to the restroom this evening to have a BM and when she wiped she noticed some bright red blood  Pt states it happened again and it was a lot of blood in the toilet  Pt states since she has been here it happened again and she noticed some clots mixed in  Pt states she has had this happen one time before about 3 or 4 years

## 2012-08-13 NOTE — ED Provider Notes (Addendum)
History     CSN: [id number]  Arrival date & time 08/13/12  [name]   First MD Initiated Contact with Patient 08/13/12 2223      Chief Complaint  Patient presents with  . Rectal Bleeding   HPI  History provided by the patient and sister. Patient is a 67 year old female with history of seizure disorder, hypertension, diabetes, CAD and diverticulitis who presents with complaints of multiple episodes of rectal bleeding. Symptoms began around 6 PM with bright red blood during bowel movement. Patient has history reports some blood into that bowl as well as on tissue paper. There has not been continuous bleeding into clothing or down the leg. Patient also reports developing some lower abdominal discomforts. She denies any other associated symptoms. Denies any fever, chills or sweats. Denies any diarrhea or constipation. Denies any nausea vomiting symptoms. She denies having similar symptoms in the past. She has not used any treatments for symptoms. Patient's last colonoscopy was 2 years ago. Patient does take a baby aspirin but no other blood thinner.    Past Medical History  Diagnosis Date  . Myocardial infarction   . Respiratory failure      multifactorial secondary to congestive heart failure, morbid obesity, decreased abdominal  compliance in the setting of pancreatitis.  . Diastolic heart failure   . Leukocytosis     secondary to steroid and pancreatitis.  . DM (diabetes mellitus)   . HTN (hypertension)   . Seizure disorder   . Hyperlipidemia   . Back pain   . Obstructive sleep apnea   . Empty sella     on MRI in 2009.  . Diverticulitis   . Abnormal liver function      in the past.  . Headache   . Pneumonia   . Stroke   . Seizures   . Pancreatitis     Past Surgical History  Procedure Laterality Date  . Cholecystectomy    . Abdominal hysterectomy    . Tubal ligation    . Tonsillectomy    . Appendectomy    . Cardiac catheterization      jan 2012  . Rectal polypectomy     . Eye cysts    . Back surgery      Family History  Problem Relation Age of Onset  . Allergies Father   . Heart disease Father     before 54  . Heart failure Father   . Hypertension Father   . Hyperlipidemia Father   . Heart disease Mother   . Diabetes Mother   . Hypertension Mother   . Hyperlipidemia Mother   . Hypertension Sister   . Heart disease Sister     before 18  . Hyperlipidemia Sister   . Cancer Other     History  Substance Use Topics  . Smoking status: Former Smoker -- 0.50 packs/day for 25 years    Types: Cigarettes    Quit date: 03/21/1982  . Smokeless tobacco: Never Used  . Alcohol Use: No    OB History   Grav Para Term Preterm Abortions TAB SAB Ect Mult Living                  Review of Systems  Constitutional: Negative for fever, chills and fatigue.  Respiratory: Negative for shortness of breath.   Cardiovascular: Negative for chest pain.  Gastrointestinal: Positive for blood in stool. Negative for nausea, vomiting, abdominal pain, diarrhea and constipation.  Neurological: Negative for dizziness, syncope and light-headedness.  All other systems reviewed and are negative.    Allergies  Codeine; Penicillins; Sulfa antibiotics; and Other  Home Medications   Current Outpatient Rx  Name  Route  Sig  Dispense  Refill  . albuterol (PROVENTIL HFA;VENTOLIN HFA) 108 (90 BASE) MCG/ACT inhaler   Inhalation   Inhale 2 puffs into the lungs every 6 (six) hours as needed for wheezing.         . ARIPiprazole (ABILIFY) 10 MG tablet   Oral   Take 10 mg by mouth daily.         Marland Kitchen aspirin EC 81 MG tablet   Oral   Take 81 mg by mouth at bedtime.          Marland Kitchen atorvastatin (LIPITOR) 10 MG tablet   Oral   Take 10 mg by mouth every evening.          . cholestyramine (QUESTRAN) 4 GM/DOSE powder   Oral   Take 4 g by mouth 2 (two) times daily as needed. For diarrhea.         . divalproex (DEPAKOTE ER) 500 MG 24 hr tablet   Oral   Take 1,000 mg  by mouth at bedtime.          . ferrous gluconate (FERGON) 324 MG tablet   Oral   Take 324 mg by mouth every evening.          . furosemide (LASIX) 40 MG tablet   Oral   Take 40 mg by mouth every evening.          . insulin glargine (LANTUS) 100 UNIT/ML injection   Subcutaneous   Inject 34 Units into the skin daily.         . insulin lispro (HUMALOG) 100 UNIT/ML injection   Subcutaneous   Inject 6 Units into the skin daily before supper.         . isosorbide dinitrate (ISORDIL) 30 MG tablet   Oral   Take 30 mg by mouth every morning.          . lamoTRIgine (LAMICTAL) 100 MG tablet   Oral   Take 100-150 mg by mouth 2 (two) times daily. 1 tab in am, 1.5 tab in pm         . Liraglutide (VICTOZA) 18 MG/3ML SOLN   Subcutaneous   Inject 1.2 mg into the skin daily with supper. If CBG > 130         . nebivolol (BYSTOLIC) 10 MG tablet   Oral   Take 10 mg by mouth daily.           Marland Kitchen olmesartan (BENICAR) 40 MG tablet   Oral   Take 40 mg by mouth daily.         Marland Kitchen omega-3 acid ethyl esters (LOVAZA) 1 G capsule   Oral   Take 1 g by mouth daily.         . Pancrelipase, Lip-Prot-Amyl, 25000 UNITS CPEP   Oral   Take 2 capsules by mouth 3 (three) times daily.          . potassium chloride SA (K-DUR,KLOR-CON) 20 MEQ tablet   Oral   Take 20 mEq by mouth daily.           . sertraline (ZOLOFT) 100 MG tablet   Oral   Take 100 mg by mouth daily.            BP 152/66  Pulse 58  Temp(Src) 98.2 F (36.8 C) (  Oral)  Resp 18  SpO2 96%  Physical Exam  Nursing note and vitals reviewed. Constitutional: She is oriented to person, place, and time. She appears well-developed and well-nourished. No distress.  HENT:  Head: Normocephalic.  Cardiovascular: Normal rate and regular rhythm.   No murmur heard. Pulmonary/Chest: Effort normal and breath sounds normal. No respiratory distress. She has no wheezes.  Abdominal: Soft. She exhibits no distension. There  is tenderness. There is no rebound and no guarding.  Mild lower abdominal discomfort. Morbidly obese  Genitourinary: Guaiac positive stool.  Neurological: She is alert and oriented to person, place, and time.  Skin: Skin is warm and dry. No rash noted.  Psychiatric: She has a normal mood and affect. Her behavior is normal.    ED Course  Procedures  Results for orders placed during the hospital encounter of 08/13/12  GLUCOSE, CAPILLARY      Result Value Range   Glucose-Capillary 126 (*) 70 - 99 mg/dL  CBC WITH DIFFERENTIAL      Result Value Range   WBC 6.1  4.0 - 10.5 K/uL   RBC 4.66  3.87 - 5.11 MIL/uL   Hemoglobin 11.1 (*) 12.0 - 15.0 g/dL   HCT 36.6 (*) 44.0 - 34.7 %   MCV 74.5 (*) 78.0 - 100.0 fL   MCH 23.8 (*) 26.0 - 34.0 pg   MCHC 32.0  30.0 - 36.0 g/dL   RDW 42.5 (*) 95.6 - 38.7 %   Platelets 265  150 - 400 K/uL   Neutrophils Relative 54  43 - 77 %   Neutro Abs 3.3  1.7 - 7.7 K/uL   Lymphocytes Relative 37  12 - 46 %   Lymphs Abs 2.3  0.7 - 4.0 K/uL   Monocytes Relative 7  3 - 12 %   Monocytes Absolute 0.4  0.1 - 1.0 K/uL   Eosinophils Relative 2  0 - 5 %   Eosinophils Absolute 0.1  0.0 - 0.7 K/uL   Basophils Relative 0  0 - 1 %   Basophils Absolute 0.0  0.0 - 0.1 K/uL  COMPREHENSIVE METABOLIC PANEL      Result Value Range   Sodium 141  135 - 145 mEq/L   Potassium 3.9  3.5 - 5.1 mEq/L   Chloride 104  96 - 112 mEq/L   CO2 28  19 - 32 mEq/L   Glucose, Bld 119 (*) 70 - 99 mg/dL   BUN 16  6 - 23 mg/dL   Creatinine, Ser 5.64  0.50 - 1.10 mg/dL   Calcium 8.8  8.4 - 33.2 mg/dL   Total Protein 6.9  6.0 - 8.3 g/dL   Albumin 3.0 (*) 3.5 - 5.2 g/dL   AST 19  0 - 37 U/L   ALT 19  0 - 35 U/L   Alkaline Phosphatase 80  39 - 117 U/L   Total Bilirubin 0.1 (*) 0.3 - 1.2 mg/dL   GFR calc non Af Amer 53 (*) >90 mL/min   GFR calc Af Amer 62 (*) >90 mL/min  OCCULT BLOOD, POC DEVICE      Result Value Range   Fecal Occult Bld POSITIVE (*) NEGATIVE         1. GI bleed        MDM  10:55 PM patient seen and evaluated. Patient appears comfortable in no acute distress.  Patient continues to do well. On reexamination abdomen she continues to have only very mild discomfort but no concerning findings. No peritoneal  signs.  Patient seen and discussed with attending physician. Will plan for observation overnight with repeat lab testing. Patient believes last colonoscopy was performed by Dr. Dulce Sellar.   Spoke with [facility]. They will see patient and admit.    Date: 08/14/2012  Rate: 55  Rhythm: normal sinus rhythm and premature atrial contractions (PAC)  QRS Axis: normal  Intervals: normal  ST/T Wave abnormalities: normal  Conduction Disutrbances:none  Narrative Interpretation:   Old EKG Reviewed: unchanged from May 18 2012      Angus Seller, [place] 08/14/12 0044  Angus Seller, PA 08/14/12 [phone]

## 2012-08-14 ENCOUNTER — Inpatient Hospital Stay (HOSPITAL_COMMUNITY): Payer: Medicare HMO

## 2012-08-14 ENCOUNTER — Encounter (HOSPITAL_COMMUNITY): Payer: Self-pay

## 2012-08-14 DIAGNOSIS — K921 Melena: Secondary | ICD-10-CM | POA: Diagnosis present

## 2012-08-14 DIAGNOSIS — E119 Type 2 diabetes mellitus without complications: Secondary | ICD-10-CM

## 2012-08-14 DIAGNOSIS — K922 Gastrointestinal hemorrhage, unspecified: Secondary | ICD-10-CM

## 2012-08-14 DIAGNOSIS — I5032 Chronic diastolic (congestive) heart failure: Secondary | ICD-10-CM

## 2012-08-14 DIAGNOSIS — I509 Heart failure, unspecified: Secondary | ICD-10-CM

## 2012-08-14 DIAGNOSIS — I2510 Atherosclerotic heart disease of native coronary artery without angina pectoris: Secondary | ICD-10-CM

## 2012-08-14 DIAGNOSIS — I10 Essential (primary) hypertension: Secondary | ICD-10-CM

## 2012-08-14 DIAGNOSIS — R0789 Other chest pain: Secondary | ICD-10-CM

## 2012-08-14 DIAGNOSIS — R569 Unspecified convulsions: Secondary | ICD-10-CM

## 2012-08-14 DIAGNOSIS — D62 Acute posthemorrhagic anemia: Secondary | ICD-10-CM

## 2012-08-14 LAB — CBC
HCT: 28.3 % — ABNORMAL LOW (ref 36.0–46.0)
HCT: 29.7 % — ABNORMAL LOW (ref 36.0–46.0)
HCT: 31.5 % — ABNORMAL LOW (ref 36.0–46.0)
Hemoglobin: 9.3 g/dL — ABNORMAL LOW (ref 12.0–15.0)
Hemoglobin: 9.3 g/dL — ABNORMAL LOW (ref 12.0–15.0)
Hemoglobin: 9.9 g/dL — ABNORMAL LOW (ref 12.0–15.0)
MCH: 24.3 pg — ABNORMAL LOW (ref 26.0–34.0)
MCH: 23.4 pg — ABNORMAL LOW (ref 26.0–34.0)
MCH: 23.6 pg — ABNORMAL LOW (ref 26.0–34.0)
MCHC: 32.9 g/dL (ref 30.0–36.0)
MCHC: 31.3 g/dL (ref 30.0–36.0)
MCHC: 31.4 g/dL (ref 30.0–36.0)
MCV: 74.1 fL — ABNORMAL LOW (ref 78.0–100.0)
MCV: 74.6 fL — ABNORMAL LOW (ref 78.0–100.0)
MCV: 75.2 fL — ABNORMAL LOW (ref 78.0–100.0)
Platelets: 208 10*3/uL (ref 150–400)
Platelets: 229 10*3/uL (ref 150–400)
Platelets: 250 10*3/uL (ref 150–400)
RBC: 3.82 MIL/uL — ABNORMAL LOW (ref 3.87–5.11)
RBC: 3.98 MIL/uL (ref 3.87–5.11)
RBC: 4.19 MIL/uL (ref 3.87–5.11)
RDW: 16.7 % — ABNORMAL HIGH (ref 11.5–15.5)
RDW: 16.6 % — ABNORMAL HIGH (ref 11.5–15.5)
RDW: 16.8 % — ABNORMAL HIGH (ref 11.5–15.5)
WBC: 5.9 10*3/uL (ref 4.0–10.5)
WBC: 5.4 10*3/uL (ref 4.0–10.5)
WBC: 6.3 10*3/uL (ref 4.0–10.5)

## 2012-08-14 LAB — COMPREHENSIVE METABOLIC PANEL
ALT: 15 U/L (ref 0–35)
AST: 13 U/L (ref 0–37)
Albumin: 2.6 g/dL — ABNORMAL LOW (ref 3.5–5.2)
Alkaline Phosphatase: 64 U/L (ref 39–117)
BUN: 17 mg/dL (ref 6–23)
CO2: 25 mEq/L (ref 19–32)
Calcium: 8 mg/dL — ABNORMAL LOW (ref 8.4–10.5)
Chloride: 107 mEq/L (ref 96–112)
Creatinine, Ser: 0.9 mg/dL (ref 0.50–1.10)
GFR calc Af Amer: 77 mL/min — ABNORMAL LOW (ref >90)
GFR calc non Af Amer: 66 mL/min — ABNORMAL LOW (ref >90)
Glucose, Bld: 192 mg/dL — ABNORMAL HIGH (ref 70–99)
Potassium: 4 mEq/L (ref 3.5–5.1)
Sodium: 140 mEq/L (ref 135–145)
Total Bilirubin: 0.1 mg/dL — ABNORMAL LOW (ref 0.3–1.2)
Total Protein: 6 g/dL (ref 6.0–8.3)

## 2012-08-14 LAB — SAMPLE TO BLOOD BANK

## 2012-08-14 LAB — GLUCOSE, CAPILLARY
Glucose-Capillary: 130 mg/dL — ABNORMAL HIGH (ref 70–99)
Glucose-Capillary: 196 mg/dL — ABNORMAL HIGH (ref 70–99)
Glucose-Capillary: 84 mg/dL (ref 70–99)
Glucose-Capillary: 99 mg/dL (ref 70–99)
Glucose-Capillary: 81 mg/dL (ref 70–99)

## 2012-08-14 LAB — TYPE AND SCREEN
ABO/RH(D): B POS
Antibody Screen: NEGATIVE

## 2012-08-14 LAB — HEMOGLOBIN A1C
Hgb A1c MFr Bld: 8.1 % — ABNORMAL HIGH (ref <5.7)
Mean Plasma Glucose: 186 mg/dL — ABNORMAL HIGH (ref <117)

## 2012-08-14 MED ORDER — LAMOTRIGINE 100 MG PO TABS
100.0000 mg | ORAL_TABLET | Freq: Every day | ORAL | Status: DC
  Administered 2012-08-14 – 2012-08-16 (×3): 100 mg via ORAL
  Filled 2012-08-14 (×3): qty 1

## 2012-08-14 MED ORDER — PANTOPRAZOLE SODIUM 40 MG IV SOLR
40.0000 mg | INTRAVENOUS | Status: DC
  Administered 2012-08-15 – 2012-08-16 (×2): 40 mg via INTRAVENOUS
  Filled 2012-08-14 (×3): qty 40

## 2012-08-14 MED ORDER — PANTOPRAZOLE SODIUM 40 MG IV SOLR
40.0000 mg | Freq: Once | INTRAVENOUS | Status: AC
  Administered 2012-08-14: 40 mg via INTRAVENOUS
  Filled 2012-08-14: qty 40

## 2012-08-14 MED ORDER — ARIPIPRAZOLE 10 MG PO TABS
10.0000 mg | ORAL_TABLET | Freq: Every day | ORAL | Status: DC
  Administered 2012-08-14 – 2012-08-16 (×3): 10 mg via ORAL
  Filled 2012-08-14 (×3): qty 1

## 2012-08-14 MED ORDER — SODIUM CHLORIDE 0.9 % IJ SOLN
3.0000 mL | Freq: Two times a day (BID) | INTRAMUSCULAR | Status: DC
  Administered 2012-08-14 – 2012-08-16 (×2): 3 mL via INTRAVENOUS

## 2012-08-14 MED ORDER — NEBIVOLOL HCL 10 MG PO TABS
10.0000 mg | ORAL_TABLET | Freq: Every day | ORAL | Status: DC
  Administered 2012-08-14 – 2012-08-16 (×3): 10 mg via ORAL
  Filled 2012-08-14 (×3): qty 1

## 2012-08-14 MED ORDER — ALBUTEROL SULFATE HFA 108 (90 BASE) MCG/ACT IN AERS: 2.0000 | INHALATION_SPRAY | Freq: Four times a day (QID) | RESPIRATORY_TRACT | Status: DC | PRN

## 2012-08-14 MED ORDER — INSULIN GLARGINE 100 UNIT/ML ~~LOC~~ SOLN
17.0000 [IU] | Freq: Every day | SUBCUTANEOUS | Status: DC
  Administered 2012-08-14 – 2012-08-16 (×3): 17 [IU] via SUBCUTANEOUS
  Filled 2012-08-14: qty 1

## 2012-08-14 MED ORDER — ATORVASTATIN CALCIUM 10 MG PO TABS
10.0000 mg | ORAL_TABLET | Freq: Every evening | ORAL | Status: DC
  Administered 2012-08-14 – 2012-08-16 (×3): 10 mg via ORAL
  Filled 2012-08-14 (×3): qty 1

## 2012-08-14 MED ORDER — DIVALPROEX SODIUM ER 500 MG PO TB24
1000.0000 mg | ORAL_TABLET | Freq: Every day | ORAL | Status: DC
  Administered 2012-08-14 – 2012-08-15 (×2): 1000 mg via ORAL
  Filled 2012-08-14 (×3): qty 2

## 2012-08-14 MED ORDER — SERTRALINE HCL 100 MG PO TABS
100.0000 mg | ORAL_TABLET | Freq: Every day | ORAL | Status: DC
  Administered 2012-08-14 – 2012-08-16 (×3): 100 mg via ORAL
  Filled 2012-08-14: qty 2
  Filled 2012-08-14 (×2): qty 1

## 2012-08-14 MED ORDER — PEG 3350-KCL-NA BICARB-NACL 420 G PO SOLR
4000.0000 mL | Freq: Once | ORAL | Status: AC
  Administered 2012-08-14: 4000 mL via ORAL
  Filled 2012-08-14: qty 4000

## 2012-08-14 MED ORDER — LAMOTRIGINE 150 MG PO TABS
150.0000 mg | ORAL_TABLET | Freq: Every day | ORAL | Status: DC
  Administered 2012-08-14 – 2012-08-15 (×2): 150 mg via ORAL
  Filled 2012-08-14 (×3): qty 1

## 2012-08-14 MED ORDER — OMEGA-3-ACID ETHYL ESTERS 1 G PO CAPS
1.0000 g | ORAL_CAPSULE | Freq: Every day | ORAL | Status: DC
  Administered 2012-08-14 – 2012-08-16 (×3): 1 g via ORAL
  Filled 2012-08-14 (×3): qty 1

## 2012-08-14 MED ORDER — HYDROCODONE-ACETAMINOPHEN 5-325 MG PO TABS
1.0000 | ORAL_TABLET | Freq: Four times a day (QID) | ORAL | Status: DC | PRN
  Administered 2012-08-14: 1 via ORAL
  Filled 2012-08-14: qty 1

## 2012-08-14 MED ORDER — SODIUM CHLORIDE 0.9 % IV SOLN
INTRAVENOUS | Status: DC
  Administered 2012-08-14: 17:00:00 via INTRAVENOUS
  Administered 2012-08-15: 500 mL via INTRAVENOUS
  Administered 2012-08-16: 08:00:00 via INTRAVENOUS

## 2012-08-14 MED ORDER — PANCRELIPASE (LIP-PROT-AMYL) 12000-38000 UNITS PO CPEP
4.0000 | ORAL_CAPSULE | Freq: Three times a day (TID) | ORAL | Status: DC
  Administered 2012-08-14 – 2012-08-16 (×8): 4 via ORAL
  Filled 2012-08-14 (×9): qty 4

## 2012-08-14 MED ORDER — KCL IN DEXTROSE-NACL 20-5-0.45 MEQ/L-%-% IV SOLN
INTRAVENOUS | Status: DC
  Administered 2012-08-14 (×2): via INTRAVENOUS
  Filled 2012-08-14 (×5): qty 1000

## 2012-08-14 MED ORDER — INSULIN ASPART 100 UNIT/ML ~~LOC~~ SOLN
0.0000 [IU] | Freq: Three times a day (TID) | SUBCUTANEOUS | Status: DC
  Administered 2012-08-14: 3 [IU] via SUBCUTANEOUS
  Administered 2012-08-16: 2 [IU] via SUBCUTANEOUS
  Administered 2012-08-16: 1 [IU] via SUBCUTANEOUS
  Filled 2012-08-14: qty 1

## 2012-08-14 NOTE — Care Management Note (Signed)
    Page 1 of 1   08/17/2012     4:46:13 PM   CARE MANAGEMENT NOTE 08/17/2012  Patient:  [name], [name]   Account Number:  [id number]  Date Initiated:  08/14/2012  Documentation initiated by:  Lanier Clam  Subjective/Objective Assessment:   ADMITTED W/[email or web address].HX: CHRONIC PANCREATITIS,DM,HTN     Action/Plan:   FROM HOME.HAS PCP,PHARMACY.   Anticipated DC Date:  08/16/2012   Anticipated DC Plan:  HOME/SELF CARE      DC Planning Services  CM consult      Choice offered to / List presented to:             Status of service:  Completed, signed off Medicare Important Message given?   (If response is "NO", the following Medicare IM given date fields will be blank) Date Medicare IM given:   Date Additional Medicare IM given:    Discharge Disposition:  HOME/SELF CARE  Per UR Regulation:  Reviewed for med. necessity/level of care/duration of stay  If discussed at Long Length of Stay Meetings, dates discussed:    Comments:  08/14/12 Rewa Weissberg RN,BSN NCM [phone] GI CONS.

## 2012-08-14 NOTE — Progress Notes (Signed)
TRH progress note  I have seen and examined pt who   67 y.o. female With h/o seizure d/o, DM, HTN, chronic pancreatitis admitted this am per Dr Cena Benton with hematochezia. She reports rectal pain and has had 2 more  Episodes of bloody stool since admission. Her hgb remains stable at 9.3 and she is hemodynamically stable. She denies cp and no SOB. I have consulted GI - Dr Evette Cristal to see, will continue current management paln as per [facility] and follow.  Donnalee Curry MD [facility] [phone]

## 2012-08-14 NOTE — ED Notes (Signed)
196 CBG

## 2012-08-14 NOTE — Progress Notes (Signed)
Patient has an allergy to Codeine, per patient she could have Vicodin which was ordered by Dr. [name]- Hulda Marin RN

## 2012-08-14 NOTE — H&P (Signed)
[facility] History and Physical  [name] ZOX:[id number] DOB: 1946/06/14 DOA: 08/13/2012  Referring physician: Dr. Dierdre Highman PCP: Alva Garnet., MD  Specialists: Seen by Dr. Dulce Sellar as outpatient  Chief Complaint: Hematochezia  HPI: [name] is a 67 y.o. female  With h/o seizure d/o, DM, HTN, chronic pancreatitis.  States that around 6 pm this evening she developed painless BRBPR.  She has had approximately 9 bouts.  She reports only some mild weakness from it.  The problem has been persistent and is not getting better.  As a result patient presented to the ED for further evaluation and recommendations.  She reports that she had a colonoscopy done approximately 3 years ago and was not told she had any abnormalities.  Reportedly patient has h/o anemia and her hemoglobin on last check was reportedly 11.8 (07/15/12) and she is presenting with hgb of 11.1.  Given painless bleeding we were consulted for admission orders and recommendations  Patient also endorses some mild chest discomfort located at the left side of her chest.  Is non radiating and does not occur with exertion.  Review of Systems: 10 point review of system reviewed and negative unless otherwise reported above.  Past Medical History  Diagnosis Date  . Myocardial infarction   . Respiratory failure      multifactorial secondary to congestive heart failure, morbid obesity, decreased abdominal  compliance in the setting of pancreatitis.  . Diastolic heart failure   . Leukocytosis     secondary to steroid and pancreatitis.  . DM (diabetes mellitus)   . HTN (hypertension)   . Seizure disorder   . Hyperlipidemia   . Back pain   . Obstructive sleep apnea   . Empty sella     on MRI in 2009.  . Diverticulitis   . Abnormal liver function      in the past.  . Headache   . Pneumonia   . Stroke   . Seizures   . Pancreatitis    Past Surgical History  Procedure Laterality Date  . Cholecystectomy    .  Abdominal hysterectomy    . Tubal ligation    . Tonsillectomy    . Appendectomy    . Cardiac catheterization      jan 2012  . Rectal polypectomy    . Eye cysts    . Back surgery     Social History:  reports that she quit smoking about 30 years ago. Her smoking use included Cigarettes. She has a 12.5 pack-year smoking history. She has never used smokeless tobacco. She reports that she does not drink alcohol or use illicit drugs.  where does patient live--home, ALF, SNF? and with whom if at home? Lives at home  Can patient participate in ADLs? yes  Allergies  Allergen Reactions  . Codeine     Headache and "just feel off"  . Penicillins Hives  . Sulfa Antibiotics Hives  . Other Rash    Bleach    Family History  Problem Relation Age of Onset  . Allergies Father   . Heart disease Father     before 62  . Heart failure Father   . Hypertension Father   . Hyperlipidemia Father   . Heart disease Mother   . Diabetes Mother   . Hypertension Mother   . Hyperlipidemia Mother   . Hypertension Sister   . Heart disease Sister     before 36  . Hyperlipidemia Sister   . Cancer Other  None new reported when directly asked.  Prior to Admission medications   Medication Sig Start Date End Date Taking? Authorizing Provider  albuterol (PROVENTIL HFA;VENTOLIN HFA) 108 (90 BASE) MCG/ACT inhaler Inhale 2 puffs into the lungs every 6 (six) hours as needed for wheezing.   Yes Historical Provider, MD  ARIPiprazole (ABILIFY) 10 MG tablet Take 10 mg by mouth daily.   Yes Historical Provider, MD  aspirin EC 81 MG tablet Take 81 mg by mouth at bedtime.    Yes Historical Provider, MD  atorvastatin (LIPITOR) 10 MG tablet Take 10 mg by mouth every evening.    Yes Historical Provider, MD  cholestyramine Lanetta Inch) 4 GM/DOSE powder Take 4 g by mouth 2 (two) times daily as needed. For diarrhea.   Yes Historical Provider, MD  divalproex (DEPAKOTE ER) 500 MG 24 hr tablet Take 1,000 mg by mouth at bedtime.     Yes Historical Provider, MD  ferrous gluconate (FERGON) 324 MG tablet Take 324 mg by mouth every evening.    Yes Historical Provider, MD  furosemide (LASIX) 40 MG tablet Take 40 mg by mouth every evening.    Yes Historical Provider, MD  insulin glargine (LANTUS) 100 UNIT/ML injection Inject 34 Units into the skin daily.   Yes Historical Provider, MD  insulin lispro (HUMALOG) 100 UNIT/ML injection Inject 6 Units into the skin daily before supper.   Yes Historical Provider, MD  isosorbide dinitrate (ISORDIL) 30 MG tablet Take 30 mg by mouth every morning.    Yes Historical Provider, MD  lamoTRIgine (LAMICTAL) 100 MG tablet Take 100-150 mg by mouth 2 (two) times daily. 1 tab in am, 1.5 tab in pm   Yes Historical Provider, MD  Liraglutide (VICTOZA) 18 MG/3ML SOLN Inject 1.2 mg into the skin daily with supper. If CBG > 130   Yes Historical Provider, MD  nebivolol (BYSTOLIC) 10 MG tablet Take 10 mg by mouth daily.     Yes Historical Provider, MD  olmesartan (BENICAR) 40 MG tablet Take 40 mg by mouth daily.   Yes Historical Provider, MD  omega-3 acid ethyl esters (LOVAZA) 1 G capsule Take 1 g by mouth daily.   Yes Historical Provider, MD  Pancrelipase, Lip-Prot-Amyl, 25000 UNITS CPEP Take 2 capsules by mouth 3 (three) times daily.    Yes Historical Provider, MD  potassium chloride SA (K-DUR,KLOR-CON) 20 MEQ tablet Take 20 mEq by mouth daily.     Yes Historical Provider, MD  sertraline (ZOLOFT) 100 MG tablet Take 100 mg by mouth daily.    Yes Historical Provider, MD   Physical Exam: Filed Vitals:   08/13/12 2031 08/14/12 0052  BP: 152/66 114/53  Pulse: 58 57  Temp: 98.2 F (36.8 C) 98.2 F (36.8 C)  TempSrc: Oral Oral  Resp: 18 18  SpO2: 96% 95%     General:  Pt in NAD, Alert and Awake  Eyes: EOMI, non icteric  ENT: normal exterior appearance, no masses on visual examination  Neck: supple, no goiter  Cardiovascular: RRR, No MRG  Respiratory: CTA BL, no wheezes  Abdomen: soft, NT, ND,  obese  Skin: warm, dry  Musculoskeletal: no cyanosis or clubbing  Psychiatric: mood and affect appropriate  Neurologic: answers questions appropriately, moves all extremities  Labs on Admission:  Basic Metabolic Panel:  Recent Labs Lab 08/13/12 2225  NA 141  K 3.9  CL 104  CO2 28  GLUCOSE 119*  BUN 16  CREATININE 1.08  CALCIUM 8.8   Liver Function Tests:  Recent  Labs Lab 08/13/12 2225  AST 19  ALT 19  ALKPHOS 80  BILITOT 0.1*  PROT 6.9  ALBUMIN 3.0*   No results found for this basename: LIPASE, AMYLASE,  in the last 168 hours No results found for this basename: AMMONIA,  in the last 168 hours CBC:  Recent Labs Lab 08/13/12 2225  WBC 6.1  NEUTROABS 3.3  HGB 11.1*  HCT 34.7*  MCV 74.5*  PLT 265   Cardiac Enzymes: No results found for this basename: CKTOTAL, CKMB, CKMBINDEX, TROPONINI,  in the last 168 hours  BNP (last 3 results) No results found for this basename: PROBNP,  in the last 8760 hours CBG:  Recent Labs Lab 08/13/12 2036  GLUCAP 126*    Radiological Exams on Admission: No results found.  EKG: Independently reviewed. Normal sinus rhythm, no st elevations or depressions  Assessment/Plan Principal Problem:   Hematochezia Active Problems:   Chronic diastolic congestive heart failure   Diabetes mellitus   Hypertension   Seizure   Anemia associated with acute blood loss Chest pain   1. Hematochezia/anemia - Recommend consulting GI in the AM - for now monitor CBC (draw the serially) - npo except for medication - transfuse if hgn < 7.9 - suspect diverticular bleed at this juncture - place on protonix. - hold aspirin - since patient will be npo but has a h/o CHF will gently hydrate overnight and reassess fluids next am  2. Chest pain - will draw troponin x 3 - Atypical as such will try trial of protonix - telemetry  3. DM - Decrease Lantus by half given NPO status - continue to monitor blood sugars q 4 hours while npo -  diabetic diet one patients diet is able to be advanced  4. HTN - hold at this juncture due to # 1 and soft bp's - hold lasix  5. Seizure - continue home regimen  6. CHF - continue B blocker with holding parameters.   Code Status: full  Family Communication: none at bedside Disposition Plan: Will need GI consult in AM. Pending clinical course  Time spent: > 60 minutes  Penny Pia [facility] Pager [phone]  If 7PM-7AM, please contact night-coverage [email or web address] Password [facility] 08/14/2012, 1:04 AM

## 2012-08-14 NOTE — ED Provider Notes (Signed)
Medical screening examination/treatment/procedure(s) were conducted as a shared visit with non-physician practitioner(s) and myself.  I personally evaluated the patient during the encounter  Sunnie Nielsen, MD 08/14/12 [phone]

## 2012-08-14 NOTE — ED Provider Notes (Signed)
Medical screening examination/treatment/procedure(s) were conducted as a shared visit with non-physician practitioner(s) and myself.  I personally evaluated the patient during the encounter.  Patient evaluated for rectal bleeding. She had 4 episodes at home since about 6 PM last night bright red blood per rectum and 4 episodes in emergency department. No significant abdominal pain. No vomiting. No syncope or dyspnea. On exam abdomen is soft nontender nondistended. Serial H&H dropping. IV fluids and Protonix provided. Medicine consult for admission.  Sunnie Nielsen, MD 08/14/12 [phone]

## 2012-08-14 NOTE — Consult Note (Signed)
Subjective:   HPI  The patient is a 67 year old female who we are asked to see in regards to rectal bleeding. She states that yesterday she started to have bright red blood per associated with bowel movements. Today she had 2 stools that were bloody in character. She has no abdominal pain or pain in the anal area. She had a colonoscopy 4 or 5 years ago and states it was normal. She otherwise feels fine at this time.  Review of Systems Denies chest pain or shortness of breath  Past Medical History  Diagnosis Date  . Myocardial infarction   . Respiratory failure      multifactorial secondary to congestive heart failure, morbid obesity, decreased abdominal  compliance in the setting of pancreatitis.  . Diastolic heart failure   . Leukocytosis     secondary to steroid and pancreatitis.  . DM (diabetes mellitus)   . HTN (hypertension)   . Seizure disorder   . Hyperlipidemia   . Back pain   . Obstructive sleep apnea   . Empty sella     on MRI in 2009.  . Diverticulitis   . Abnormal liver function      in the past.  . Headache   . Pneumonia   . Stroke   . Seizures   . Pancreatitis   . Family history of anesthesia complication     History of a seizure   Past Surgical History  Procedure Laterality Date  . Cholecystectomy    . Abdominal hysterectomy    . Tubal ligation    . Tonsillectomy    . Appendectomy    . Cardiac catheterization      jan 2012  . Rectal polypectomy    . Eye cysts    . Back surgery     History   Social History  . Marital Status: Divorced    Spouse Name: N/A    Number of Children: N/A  . Years of Education: N/A   Occupational History  . Disabled    Social History Main Topics  . Smoking status: Former Smoker -- 0.50 packs/day for 25 years    Types: Cigarettes    Quit date: 03/21/1982  . Smokeless tobacco: Never Used  . Alcohol Use: No  . Drug Use: No  . Sexually Active: No   Other Topics Concern  . Not on file   Social History Narrative   . No narrative on file   family history includes Allergies in her father; Cancer in her other; Diabetes in her mother; Heart disease in her father, mother, and sister; Heart failure in her father; Hyperlipidemia in her father, mother, and sister; and Hypertension in her father, mother, and sister. Current facility-administered medications:albuterol (PROVENTIL HFA;VENTOLIN HFA) 108 (90 BASE) MCG/ACT inhaler 2 puff, 2 puff, Inhalation, Q6H PRN, Penny Pia, MD;  ARIPiprazole (ABILIFY) tablet 10 mg, 10 mg, Oral, Daily, Penny Pia, MD, 10 mg at 08/14/12 1135;  atorvastatin (LIPITOR) tablet 10 mg, 10 mg, Oral, QPM, Penny Pia, MD dextrose 5 % and 0.45 % NaCl with KCl 20 mEq/L infusion, , Intravenous, Continuous, Penny Pia, MD, Last Rate: 50 mL/hr at 08/14/12 1000;  divalproex (DEPAKOTE ER) 24 hr tablet 1,000 mg, 1,000 mg, Oral, QHS, Penny Pia, MD;  HYDROcodone-acetaminophen (NORCO/VICODIN) 5-325 MG per tablet 1-2 tablet, 1-2 tablet, Oral, Q6H PRN, Kela Millin, MD, 1 tablet at 08/14/12 1602 insulin aspart (novoLOG) injection 0-15 Units, 0-15 Units, Subcutaneous, TID WC, Penny Pia, MD, 3 Units at 08/14/12 0820;  insulin  glargine (LANTUS) injection 17 Units, 17 Units, Subcutaneous, Daily, Penny Pia, MD, 17 Units at 08/14/12 1032;  lamoTRIgine (LAMICTAL) tablet 100 mg, 100 mg, Oral, Daily, Penny Pia, MD, 100 mg at 08/14/12 1137;  lamoTRIgine (LAMICTAL) tablet 150 mg, 150 mg, Oral, QHS, Sunnie Nielsen, MD lipase/protease/amylase (CREON-10/PANCREASE) capsule 4 capsule, 4 capsule, Oral, TID, Penny Pia, MD, 4 capsule at 08/14/12 1601;  nebivolol (BYSTOLIC) tablet 10 mg, 10 mg, Oral, Daily, Penny Pia, MD, 10 mg at 08/14/12 1136;  omega-3 acid ethyl esters (LOVAZA) capsule 1 g, 1 g, Oral, Daily, Penny Pia, MD, 1 g at 08/14/12 1135;  pantoprazole (PROTONIX) injection 40 mg, 40 mg, Intravenous, Q24H, Penny Pia, MD sertraline (ZOLOFT) tablet 100 mg, 100 mg, Oral, Daily, Penny Pia, MD, 100  mg at 08/14/12 1034;  sodium chloride 0.9 % injection 3 mL, 3 mL, Intravenous, Q12H, Penny Pia, MD, 3 mL at 08/14/12 0321 Allergies  Allergen Reactions  . Codeine     Headache and "just feel off"  . Penicillins Hives  . Sulfa Antibiotics Hives  . Other Rash    Bleach     Objective:     BP 118/42  Pulse 55  Temp(Src) 97.8 F (36.6 C) (Oral)  Resp 21  Ht 5' (1.524 m)  Wt 109 kg (240 lb 4.8 oz)  BMI 46.93 kg/m2  SpO2 100%  She is in no acute distress  Nonicteric  Heart regular rhythm no murmurs  Lungs clear  Abdomen: Bowel sounds normal, soft, nontender Lab Results  Component Value Date   HGB 9.3* 08/14/2012   HGB 9.3* 08/14/2012   HGB 11.1* 08/13/2012   HCT 29.7* 08/14/2012   HCT 28.3* 08/14/2012   HCT 34.7* 08/13/2012   ALKPHOS 64 08/14/2012   ALKPHOS 80 08/13/2012   ALKPHOS 71 07/15/2012   AST 13 08/14/2012   AST 19 08/13/2012   AST 15 07/15/2012   ALT 15 08/14/2012   ALT 19 08/13/2012   ALT 8 07/15/2012   AMYLASE 20 10/29/2011   AMYLASE 62 05/20/2009     Laboratory No components found with this basename: d1      Assessment:     Rectal bleeding etiology unclear. This could be from diverticulosis, colon polyp, colon tumor, or inflammatory process.      Plan:     Discussed colonoscopy with the patient. This will be set up for tomorrow.

## 2012-08-14 NOTE — ED Notes (Signed)
Ice chips given to pt.  

## 2012-08-15 ENCOUNTER — Encounter (HOSPITAL_COMMUNITY): Payer: Self-pay

## 2012-08-15 ENCOUNTER — Encounter (HOSPITAL_COMMUNITY)
Admission: EM | Disposition: A | Discharge status: Home / Self Care | Payer: Self-pay | Source: Home / Self Care | Attending: Internal Medicine

## 2012-08-15 ENCOUNTER — Encounter (HOSPITAL_COMMUNITY): Payer: Self-pay | Admitting: *Deleted

## 2012-08-15 HISTORY — PX: COLONOSCOPY: SHX5424

## 2012-08-15 LAB — BASIC METABOLIC PANEL
BUN: 7 mg/dL (ref 6–23)
CO2: 25 mEq/L (ref 19–32)
Calcium: 8.2 mg/dL — ABNORMAL LOW (ref 8.4–10.5)
Chloride: 101 mEq/L (ref 96–112)
Creatinine, Ser: 0.79 mg/dL (ref 0.50–1.10)
GFR calc Af Amer: >90 mL/min (ref >90)
GFR calc non Af Amer: 86 mL/min — ABNORMAL LOW (ref >90)
Glucose, Bld: 89 mg/dL (ref 70–99)
Potassium: 3.8 mEq/L (ref 3.5–5.1)
Sodium: 139 mEq/L (ref 135–145)

## 2012-08-15 LAB — TROPONIN I: Troponin I: <0.3 ng/mL (ref <0.30)

## 2012-08-15 LAB — CBC
HCT: 26.6 % — ABNORMAL LOW (ref 36.0–46.0)
HCT: 29.3 % — ABNORMAL LOW (ref 36.0–46.0)
Hemoglobin: 8.6 g/dL — ABNORMAL LOW (ref 12.0–15.0)
Hemoglobin: 9.3 g/dL — ABNORMAL LOW (ref 12.0–15.0)
MCH: 24.4 pg — ABNORMAL LOW (ref 26.0–34.0)
MCH: 24 pg — ABNORMAL LOW (ref 26.0–34.0)
MCHC: 32.3 g/dL (ref 30.0–36.0)
MCHC: 31.7 g/dL (ref 30.0–36.0)
MCV: 75.4 fL — ABNORMAL LOW (ref 78.0–100.0)
MCV: 75.5 fL — ABNORMAL LOW (ref 78.0–100.0)
Platelets: 191 10*3/uL (ref 150–400)
Platelets: 213 10*3/uL (ref 150–400)
RBC: 3.53 MIL/uL — ABNORMAL LOW (ref 3.87–5.11)
RBC: 3.88 MIL/uL (ref 3.87–5.11)
RDW: 17 % — ABNORMAL HIGH (ref 11.5–15.5)
RDW: 16.8 % — ABNORMAL HIGH (ref 11.5–15.5)
WBC: 4.4 10*3/uL (ref 4.0–10.5)
WBC: 4.4 10*3/uL (ref 4.0–10.5)

## 2012-08-15 LAB — GLUCOSE, CAPILLARY
Glucose-Capillary: 157 mg/dL — ABNORMAL HIGH (ref 70–99)
Glucose-Capillary: 78 mg/dL (ref 70–99)
Glucose-Capillary: 79 mg/dL (ref 70–99)
Glucose-Capillary: 88 mg/dL (ref 70–99)
Glucose-Capillary: 88 mg/dL (ref 70–99)
Glucose-Capillary: 98 mg/dL (ref 70–99)

## 2012-08-15 SURGERY — COLONOSCOPY
Anesthesia: Moderate Sedation

## 2012-08-15 MED ORDER — MIDAZOLAM HCL 5 MG/5ML IJ SOLN
INTRAMUSCULAR | Status: DC | PRN
  Administered 2012-08-15: 1 mg via INTRAVENOUS
  Administered 2012-08-15 (×2): 2 mg via INTRAVENOUS

## 2012-08-15 MED ORDER — INSULIN ASPART 100 UNIT/ML ~~LOC~~ SOLN
4.0000 [IU] | Freq: Once | SUBCUTANEOUS | Status: AC
  Administered 2012-08-15: 4 [IU] via SUBCUTANEOUS

## 2012-08-15 MED ORDER — MIDAZOLAM HCL 10 MG/2ML IJ SOLN
INTRAMUSCULAR | Status: AC
  Filled 2012-08-15: qty 4

## 2012-08-15 MED ORDER — FENTANYL CITRATE 0.05 MG/ML IJ SOLN
INTRAMUSCULAR | Status: AC
  Filled 2012-08-15: qty 4

## 2012-08-15 MED ORDER — INSULIN ASPART 100 UNIT/ML ~~LOC~~ SOLN: 0.0000 [IU] | Freq: Every day | SUBCUTANEOUS | Status: DC

## 2012-08-15 MED ORDER — FENTANYL CITRATE 0.05 MG/ML IJ SOLN
INTRAMUSCULAR | Status: DC | PRN
  Administered 2012-08-15: 12.5 ug via INTRAVENOUS
  Administered 2012-08-15 (×2): 25 ug via INTRAVENOUS

## 2012-08-15 MED ORDER — DIPHENHYDRAMINE HCL 50 MG/ML IJ SOLN
INTRAMUSCULAR | Status: AC
  Filled 2012-08-15: qty 1

## 2012-08-15 NOTE — Interval H&P Note (Signed)
History and Physical Interval Note:  08/15/2012 9:14 AM  [name]  has presented today for surgery, with the diagnosis of rectal bleeding  The various methods of treatment have been discussed with the patient and family. After consideration of risks, benefits and other options for treatment, the patient has consented to  Procedure(s): COLONOSCOPY (N/A) as a surgical intervention .  The patient's history has been reviewed, patient examined, no change in status, stable for surgery.  I have reviewed the patient's chart and labs.  Questions were answered to the patient's satisfaction.     Shaylyn Bawa M  Assessment:  1.  Hematochezia. 2.  Anemia. 3.  Prior history of pancreatitis (remote).  Plan:  1.  Colonoscopy. 2.  Risks (bleeding, infection, bowel perforation that could require surgery, sedation-related changes in cardiopulmonary systems), benefits (identification and possible treatment of source of symptoms, exclusion of certain causes of symptoms), and alternatives (watchful waiting, radiographic imaging studies, empiric medical treatment) of colonoscopy were explained to patient in detail and patient wishes to proceed.

## 2012-08-15 NOTE — Progress Notes (Addendum)
Pt's CBG taken at 2010 was 242. Data not transferring successfully from Glucometer to Epic at this time. MD paged for insulin coverage. Will continue to monitor pt. - [name] Faith, RN

## 2012-08-15 NOTE — Progress Notes (Signed)
[facility] PROGRESS NOTE  [name] ZOX:[id number] DOB: [birth year] DOA: 08/13/2012 PCP: Alva Garnet., MD  Assessment/Plan:   Principal Problem:  Hematochezia/Acute blood loss Anemia - s/p colonoscopy this am [name]/w likely diverticular bleed that appears to have stopped -repeat today Hgb 9.3, conitnue to follow and transfuse as appropriate Active Problems:  Chronic diastolic congestive heart failure  -appears euvolemic, continue monitoring fluid status closely Diabetes mellitus  -change to modified carb diet -continue lantus and SSI Hypertension  -controlled on current meds Seizure -continue outpt meds, she remains sz free  Chest pain -will order troponins as intended on admission -continue PPI -she is chest pain free  Code Status: full Family Communication: sister at bedside Disposition Plan: to home when stable   Consultants:  GI -Dr Evette Cristal  Procedures:  colonoscopy  Antibiotics:  none  HPI/Subjective: Denies any new [name]/o, s/p colonoscopy  Objective: Filed Vitals:   08/15/12 1020 08/15/12 1030 08/15/12 1431 08/15/12 1955  BP: 135/60 130/76 124/53 126/65  Pulse: 65 70 67 67  Temp:   98.1 F (36.7 [name]) 98.2 F (36.8 [name])  TempSrc:   Oral Oral  Resp: 15 15 18 17   Height:      Weight:      SpO2: 97% 98% 99% 97%    Intake/Output Summary (Last 24 hours) at 08/15/12 2104 Last data filed at 08/15/12 1847  Gross per 24 hour  Intake    962 ml  Output    350 ml  Net    612 ml   Filed Weights   08/14/12 1209  Weight: 109 kg (240 lb 4.8 oz)    Exam:   General:  Older female in NAD, A&Ox3   Cardiovascular: RRR, nl S1S2  Respiratory: CTAB  Abdomen: soft,+bs,NT/ND  Extremities:no edema, no cyanosis  Data Reviewed: Basic Metabolic Panel:  Recent Labs Lab 08/13/12 2225 08/14/12 0610 08/15/12 0320  NA 141 140 139  K 3.9 4.0 3.8  CL 104 107 101  CO2 28 25 25   GLUCOSE 119* 192* 89  BUN 16 17 7   CREATININE 1.08 0.90 0.79   CALCIUM 8.8 8.0* 8.2*   Liver Function Tests:  Recent Labs Lab 08/13/12 2225 08/14/12 0610  AST 19 13  ALT 19 15  ALKPHOS 80 64  BILITOT 0.1* 0.1*  PROT 6.9 6.0  ALBUMIN 3.0* 2.6*   No results found for this basename: LIPASE, AMYLASE,  in the last 168 hours No results found for this basename: AMMONIA,  in the last 168 hours CBC:  Recent Labs Lab 08/13/12 2225 08/14/12 0309 08/14/12 1156 08/14/12 1912 08/15/12 0320 08/15/12 1143  WBC 6.1 5.9 5.4 6.3 4.4 4.4  NEUTROABS 3.3  --   --   --   --   --   HGB 11.1* 9.3* 9.3* 9.9* 8.6* 9.3*  HCT 34.7* 28.3* 29.7* 31.5* 26.6* 29.3*  MCV 74.5* 74.1* 74.6* 75.2* 75.4* 75.5*  PLT 265 208 229 250 191 213   Cardiac Enzymes: No results found for this basename: CKTOTAL, CKMB, CKMBINDEX, TROPONINI,  in the last 168 hours BNP (last 3 results) No results found for this basename: PROBNP,  in the last 8760 hours CBG:  Recent Labs Lab 08/15/12 0405 08/15/12 0734 08/15/12 1026 08/15/12 1136 08/15/12 1647  GLUCAP 88 88 79 98 157*    No results found for this or any previous visit (from the past 240 hour(s)).   Studies: X-ray Chest Pa And Lateral   08/14/2012  *RADIOLOGY REPORT*  Clinical Data: Chest pain  CHEST - 2 VIEW  Comparison: 07/15/2012  Findings: Heart size upper normal though less pronounced than on the prior.  Mediastinal contours are otherwise unchanged.  Mild right infrahilar opacities.  No pleural effusion or pneumothorax. Multilevel degenerative changes.  IMPRESSION: Heart size upper normal to decreased in the interval.  Mild right infrahilar opacity; atelectasis versus infiltrate.   Original Report Authenticated By: Jearld Lesch, M.D.     Scheduled Meds: . ARIPiprazole  10 mg Oral Daily  . atorvastatin  10 mg Oral QPM  . divalproex  1,000 mg Oral QHS  . insulin aspart  0-15 Units Subcutaneous TID WC  . insulin aspart  4 Units Subcutaneous Once  . insulin glargine  17 Units Subcutaneous Daily  . lamoTRIgine   100 mg Oral Daily  . lamoTRIgine  150 mg Oral QHS  . lipase/protease/amylase  4 capsule Oral TID  . nebivolol  10 mg Oral Daily  . omega-3 acid ethyl esters  1 g Oral Daily  . pantoprazole (PROTONIX) IV  40 mg Intravenous Q24H  . sertraline  100 mg Oral Daily  . sodium chloride  3 mL Intravenous Q12H   Continuous Infusions: . sodium chloride 500 mL (08/15/12 0820)  . dextrose 5 % and 0.45 % NaCl with KCl 20 mEq/L 50 mL/hr at 08/14/12 1000    Principal Problem:   Hematochezia Active Problems:   Chronic diastolic congestive heart failure   Diabetes mellitus   Hypertension   Seizure   Anemia associated with acute blood loss   Chest discomfort    Time spent:    [name]  [facility] Pager [phone] If 8PM-8AM, please contact night-coverage at [email or web address], password [facility] 08/15/2012, 9:04 PM  LOS: 2 days

## 2012-08-15 NOTE — Op Note (Signed)
[facility] [address] [place], [address]   COLONOSCOPY PROCEDURE REPORT  PATIENT: [name], [name]  MR#: [id number] BIRTHDATE: [birth year] , 66  yrs. old GENDER: Female ENDOSCOPIST: Willis Modena, MD REFERRED Wyvonnia Dusky, M.D.; [facility] PROCEDURE DATE:  08/15/2012 PROCEDURE:   Colonoscopy with snare polypectomy ASA CLASS:   Class III INDICATIONS:painless hematochezia. MEDICATIONS: Fentanyl 62.5 mcg IV and Versed 6 mg IV DESCRIPTION OF PROCEDURE:   After the risks benefits and alternatives of the procedure were thoroughly explained, informed consent was obtained.  A digital rectal exam revealed no abnormalities of the rectum.   The Pentax Colonoscope [id number] endoscope was introduced through the anus and advanced to the cecum, which was identified by both the appendix and ileocecal valve. No adverse events experienced.   The quality of the prep was fair.  The instrument was then slowly withdrawn as the colon was fully examined.  FINDINGS:  Normal rectal exam.  Prep quality fair in ascending colon and cecum (diminutive lesions here could have been missed) but good otherwise.  Extensive left-sided diverticula seen, without focal bleeding site seen.  5mm descending colon polyp, removed with cold snare.  No other polyps, masses, vascular ectasias, or inflammatory changes were seen.  Normal retroflexed view of rectum.  No old or fresh blood seen anywhere in the colon.       Withdrawal time was over 10 minutes     .  The scope was withdrawn and the procedure completed.  ENDOSCOPIC IMPRESSION:     As above.  Suspect bleeding was diverticular in nature, and has seemingly stopped.  RECOMMENDATIONS:     1.  Watch for potential complications of procedure. 2.  Await polypectomy results. 3.  Repeat colonoscopy in 5 years for polyp surveillance. 4.  Advance diet. 5.  If no further bleeding in the next 24 hours, and tolerates diet today, patient can be  discharged tomorrow. 6.  [facility] will sign-off; please call us with any questions. 7.  Will arrange outpatient follow-up with me (Dr. Dulce Sellar) in the next 4-6 weeks.  eSigned:  Willis Modena, MD 08/15/2012 9:55 AM revised cc:

## 2012-08-16 LAB — BASIC METABOLIC PANEL
BUN: 8 mg/dL (ref 6–23)
CO2: 28 mEq/L (ref 19–32)
Calcium: 8.5 mg/dL (ref 8.4–10.5)
Chloride: 106 mEq/L (ref 96–112)
Creatinine, Ser: 0.82 mg/dL (ref 0.50–1.10)
GFR calc Af Amer: 86 mL/min — ABNORMAL LOW (ref >90)
GFR calc non Af Amer: 74 mL/min — ABNORMAL LOW (ref >90)
Glucose, Bld: 85 mg/dL (ref 70–99)
Potassium: 3.9 mEq/L (ref 3.5–5.1)
Sodium: 139 mEq/L (ref 135–145)

## 2012-08-16 LAB — CBC
HCT: 26.9 % — ABNORMAL LOW (ref 36.0–46.0)
Hemoglobin: 8.7 g/dL — ABNORMAL LOW (ref 12.0–15.0)
MCH: 24.4 pg — ABNORMAL LOW (ref 26.0–34.0)
MCHC: 32.3 g/dL (ref 30.0–36.0)
MCV: 75.4 fL — ABNORMAL LOW (ref 78.0–100.0)
Platelets: 194 10*3/uL (ref 150–400)
RBC: 3.57 MIL/uL — ABNORMAL LOW (ref 3.87–5.11)
RDW: 16.8 % — ABNORMAL HIGH (ref 11.5–15.5)
WBC: 4.2 10*3/uL (ref 4.0–10.5)

## 2012-08-16 LAB — GLUCOSE, CAPILLARY
Glucose-Capillary: 130 mg/dL — ABNORMAL HIGH (ref 70–99)
Glucose-Capillary: 179 mg/dL — ABNORMAL HIGH (ref 70–99)
Glucose-Capillary: 242 mg/dL — ABNORMAL HIGH (ref 70–99)
Glucose-Capillary: 242 mg/dL — ABNORMAL HIGH (ref 70–99)
Glucose-Capillary: 81 mg/dL (ref 70–99)
Glucose-Capillary: 87 mg/dL (ref 70–99)
Glucose-Capillary: 97 mg/dL (ref 70–99)

## 2012-08-16 LAB — TROPONIN I: Troponin I: <0.3 ng/mL (ref <0.30)

## 2012-08-16 MED ORDER — ASPIRIN EC 81 MG PO TBEC: 81.0000 mg | DELAYED_RELEASE_TABLET | Freq: Every day | ORAL | Status: DC

## 2012-08-16 NOTE — Discharge Summary (Signed)
Physician Discharge Summary  KEIR FOLAND WUJ:[id number] DOB: 12/11/1945 DOA: 08/13/2012  PCP: Alva Garnet., MD  Admit date: 08/13/2012 Discharge date: 08/16/2012  Time spent: >30 minutes  Recommendations for Outpatient Follow-up:      Follow-up Information   Follow up with Alva Garnet., MD. (in 1-2weeks, call for appt upon discharge)    Contact information:   [address] [place] [address] [phone]       Follow up with Freddy Jaksch, MD. (in 1week, call for appt upon discharge)    Contact information:   [address] [place] [address] [place] [address] [phone]        Discharge Diagnoses:  Principal Problem:   Hematochezia Active Problems:   Chronic diastolic congestive heart failure   Diabetes mellitus   Hypertension   Seizure   Anemia associated with acute blood loss   Chest discomfort   Discharge Condition: Improved/stable  Diet recommendation: Modified carbohydrate  Filed Weights   08/14/12 1209 08/16/12 0545  Weight: 109 kg (240 lb 4.8 oz) 109.272 kg (240 lb 14.4 oz)    History of present illness:  [name] is a 67 y.o. female  With h/o seizure d/o, DM, HTN, chronic pancreatitis. States that around 6 pm this evening she developed painless BRBPR. She has had approximately 9 bouts. She reports only some mild weakness from it. The problem has been persistent and is not getting better. As a result patient presented to the ED for further evaluation and recommendations. She reports that she had a colonoscopy done approximately 3 years ago and was not told she had any abnormalities.  Reportedly patient has h/o anemia and her hemoglobin on last check was reportedly 11.8 (07/15/12) and she is presenting with hgb of 11.1. Given painless bleeding we were consulted for admission orders and recommendations  Patient also endorses some mild chest discomfort located at the left side of her chest. Is non radiating and does not  occur with exertion.   Hospital Course:  Hematochezia/Acute blood loss Anemia  As discussed above, upon admission patient's hemoglobin to monitored and she has not required a blood transfusion. - GI was consulted and saw patient and colonoscopy was done 2/21 c/w likely diverticular bleed that appears to have stopped  -2/21 Hgb 9.3>>8.7 today, and she denies any further hematochezia. She has remained hemodynamically stable is tolerating by mouth well and is to followup with PCP/GI -Dr. Dulce Sellar upon discharge, have followup H&H. She has been instructed to hold off aspirin for one week Active Problems:  Chronic diastolic congestive heart failure  - remained euvolemic, or in this hospitalization, she is to continue her outpatient medications and follow up outpatient. Diabetes mellitus  - she was maintained on Lantus while in the hospital and covered with sliding scale insulin, she is to continue her outpatient regimen upon discharge and followup with her PCP. Hypertension  -She is to continue her outpatient medications upon discharge  Seizure  -continue outpt meds, she remains sz free  Chest pain  -Cardiac enzymes with cycled and came back negative.  -continue PPI  -she is chest pain free   Procedures:  Colonoscopy on 2/21 per Dr. Dulce Sellar  Consultations:  GI-Dr. Dulce Sellar  Discharge Exam: Filed Vitals:   08/15/12 1955 08/16/12 0545 08/16/12 0957 08/16/12 1300  BP: 126/65 133/65 119/42 123/60  Pulse: 67 64 64 72  Temp: 98.2 F (36.8 C) 97.9 F (36.6 C)  98 F (36.7 C)  TempSrc: Oral Oral  Oral  Resp: 17 18  19   Height:  5' (1.524 m)    Weight:  109.272 kg (240 lb 14.4 oz)    SpO2: 97% 98%  99%   Exam:  General: Older female in NAD, A&Ox3  Cardiovascular: RRR, nl S1S2  Respiratory: CTAB  Abdomen: soft,+bs,NT/ND  Extremities:no edema, no cyanosis  Discharge Instructions  Discharge Orders   Future Appointments Provider Department Dept Phone   09/02/2012 10:00 AM Larina Earthly, MD [facility] [phone]   Future Orders Complete By Expires     Diet Carb Modified  As directed     Increase activity slowly  As directed         Medication List    TAKE these medications       albuterol 108 (90 BASE) MCG/ACT inhaler  Commonly known as:  PROVENTIL HFA;VENTOLIN HFA  Inhale 2 puffs into the lungs every 6 (six) hours as needed for wheezing.     ARIPiprazole 10 MG tablet  Commonly known as:  ABILIFY  Take 10 mg by mouth daily.     aspirin EC 81 MG tablet  Take 1 tablet (81 mg total) by mouth at bedtime.     atorvastatin 10 MG tablet  Commonly known as:  LIPITOR  Take 10 mg by mouth every evening.     cholestyramine 4 GM/DOSE powder  Commonly known as:  QUESTRAN  Take 4 g by mouth 2 (two) times daily as needed. For diarrhea.     divalproex 500 MG 24 hr tablet  Commonly known as:  DEPAKOTE ER  Take 1,000 mg by mouth at bedtime.     ferrous gluconate 324 MG tablet  Commonly known as:  FERGON  Take 324 mg by mouth every evening.     furosemide 40 MG tablet  Commonly known as:  LASIX  Take 40 mg by mouth every evening.     insulin glargine 100 UNIT/ML injection  Commonly known as:  LANTUS  Inject 34 Units into the skin daily.     insulin lispro 100 UNIT/ML injection  Commonly known as:  HUMALOG  Inject 6 Units into the skin daily before supper.     isosorbide dinitrate 30 MG tablet  Commonly known as:  ISORDIL  Take 30 mg by mouth every morning.     LAMICTAL 100 MG tablet  Generic drug:  lamoTRIgine  Take 100-150 mg by mouth 2 (two) times daily. 1 tab in am, 1.5 tab in pm     nebivolol 10 MG tablet  Commonly known as:  BYSTOLIC  Take 10 mg by mouth daily.     olmesartan 40 MG tablet  Commonly known as:  BENICAR  Take 40 mg by mouth daily.     omega-3 acid ethyl esters 1 G capsule  Commonly known as:  LOVAZA  Take 1 g by mouth daily.     Pancrelipase (Lip-Prot-Amyl) 25000 UNITS Cpep  Take 2 capsules  by mouth 3 (three) times daily.     potassium chloride SA 20 MEQ tablet  Commonly known as:  K-DUR,KLOR-CON  Take 20 mEq by mouth daily.     sertraline 100 MG tablet  Commonly known as:  ZOLOFT  Take 100 mg by mouth daily.     VICTOZA 18 MG/3ML Soln injection  Generic drug:  Liraglutide  Inject 1.2 mg into the skin daily with supper. If CBG > 130           Follow-up Information   Follow  up with Alva Garnet., MD. (in 1-2weeks, call for appt upon discharge)    Contact information:   [address] [place] [address] [phone]       Follow up with Freddy Jaksch, MD. (in 1week, call for appt upon discharge)    Contact information:   [address] [place] [address] [phone]        The results of significant diagnostics from this hospitalization (including imaging, microbiology, ancillary and laboratory) are listed below for reference.    Significant Diagnostic Studies: X-ray Chest Pa And Lateral   08/14/2012  *RADIOLOGY REPORT*  Clinical Data: Chest pain  CHEST - 2 VIEW  Comparison: 07/15/2012  Findings: Heart size upper normal though less pronounced than on the prior.  Mediastinal contours are otherwise unchanged.  Mild right infrahilar opacities.  No pleural effusion or pneumothorax. Multilevel degenerative changes.  IMPRESSION: Heart size upper normal to decreased in the interval.  Mild right infrahilar opacity; atelectasis versus infiltrate.   Original Report Authenticated By: Jearld Lesch, M.D.     Microbiology: No results found for this or any previous visit (from the past 240 hour(s)).   Labs: Basic Metabolic Panel:  Recent Labs Lab 08/13/12 2225 08/14/12 0610 08/15/12 0320 08/16/12 0341  NA 141 140 139 139  K 3.9 4.0 3.8 3.9  CL 104 107 101 106  CO2 28 25 25 28   GLUCOSE 119* 192* 89 85  BUN 16 17 7 8   CREATININE 1.08 0.90 0.79 0.82  CALCIUM 8.8 8.0* 8.2* 8.5   Liver Function Tests:  Recent  Labs Lab 08/13/12 2225 08/14/12 0610  AST 19 13  ALT 19 15  ALKPHOS 80 64  BILITOT 0.1* 0.1*  PROT 6.9 6.0  ALBUMIN 3.0* 2.6*   No results found for this basename: LIPASE, AMYLASE,  in the last 168 hours No results found for this basename: AMMONIA,  in the last 168 hours CBC:  Recent Labs Lab 08/13/12 2225  08/14/12 1156 08/14/12 1912 08/15/12 0320 08/15/12 1143 08/16/12 0341  WBC 6.1  < > 5.4 6.3 4.4 4.4 4.2  NEUTROABS 3.3  --   --   --   --   --   --   HGB 11.1*  < > 9.3* 9.9* 8.6* 9.3* 8.7*  HCT 34.7*  < > 29.7* 31.5* 26.6* 29.3* 26.9*  MCV 74.5*  < > 74.6* 75.2* 75.4* 75.5* 75.4*  PLT 265  < > 229 250 191 213 194  < > = values in this interval not displayed. Cardiac Enzymes:  Recent Labs Lab 08/15/12 2245 08/16/12 0341  TROPONINI <0.30 <0.30   BNP: BNP (last 3 results) No results found for this basename: PROBNP,  in the last 8760 hours CBG:  Recent Labs Lab 08/15/12 2008 08/16/12 0048 08/16/12 0317 08/16/12 0800 08/16/12 1140  GLUCAP 242*  242* 87 81 97 179*       Signed:  Josaiah Muhammed C  [facility] 08/16/2012, 2:54 PM

## 2012-08-16 NOTE — Progress Notes (Signed)
Patient discharged  Home, discharge instructions given and explained to patient and she verbalized understanding, denies any pain/distress. Skin intact, no wound. Accompanied home by son, transported to the car by staff via wheelchair.

## 2012-08-18 ENCOUNTER — Encounter (HOSPITAL_COMMUNITY): Payer: Self-pay | Admitting: Gastroenterology

## 2012-09-01 ENCOUNTER — Encounter: Payer: Self-pay | Admitting: Vascular Surgery

## 2012-09-02 ENCOUNTER — Ambulatory Visit: Payer: Medicare HMO | Admitting: Vascular Surgery

## 2012-09-24 ENCOUNTER — Other Ambulatory Visit: Payer: Self-pay | Admitting: Internal Medicine

## 2012-09-29 ENCOUNTER — Encounter: Payer: Self-pay | Admitting: Vascular Surgery

## 2012-09-30 ENCOUNTER — Ambulatory Visit: Payer: Medicare HMO | Admitting: Vascular Surgery

## 2012-10-31 ENCOUNTER — Encounter (HOSPITAL_COMMUNITY): Payer: Self-pay | Admitting: Emergency Medicine

## 2012-10-31 ENCOUNTER — Inpatient Hospital Stay (HOSPITAL_COMMUNITY): Payer: Medicare HMO

## 2012-10-31 ENCOUNTER — Inpatient Hospital Stay (HOSPITAL_COMMUNITY)
Admission: EM | Admit: 2012-10-31 | Discharge: 2012-11-05 | DRG: 393 | Disposition: A | Discharge status: Home / Self Care | Payer: Medicare HMO | Attending: Internal Medicine | Admitting: Internal Medicine

## 2012-10-31 DIAGNOSIS — I2510 Atherosclerotic heart disease of native coronary artery without angina pectoris: Secondary | ICD-10-CM

## 2012-10-31 DIAGNOSIS — E1169 Type 2 diabetes mellitus with other specified complication: Secondary | ICD-10-CM | POA: Diagnosis present

## 2012-10-31 DIAGNOSIS — I10 Essential (primary) hypertension: Secondary | ICD-10-CM

## 2012-10-31 DIAGNOSIS — K559 Vascular disorder of intestine, unspecified: Principal | ICD-10-CM | POA: Diagnosis present

## 2012-10-31 DIAGNOSIS — E119 Type 2 diabetes mellitus without complications: Secondary | ICD-10-CM | POA: Diagnosis present

## 2012-10-31 DIAGNOSIS — I110 Hypertensive heart disease with heart failure: Secondary | ICD-10-CM | POA: Diagnosis present

## 2012-10-31 DIAGNOSIS — K5790 Diverticulosis of intestine, part unspecified, without perforation or abscess without bleeding: Secondary | ICD-10-CM

## 2012-10-31 DIAGNOSIS — R1011 Right upper quadrant pain: Secondary | ICD-10-CM | POA: Diagnosis present

## 2012-10-31 DIAGNOSIS — K625 Hemorrhage of anus and rectum: Secondary | ICD-10-CM

## 2012-10-31 DIAGNOSIS — E1165 Type 2 diabetes mellitus with hyperglycemia: Secondary | ICD-10-CM | POA: Diagnosis present

## 2012-10-31 DIAGNOSIS — D62 Acute posthemorrhagic anemia: Secondary | ICD-10-CM | POA: Diagnosis present

## 2012-10-31 DIAGNOSIS — K5731 Diverticulosis of large intestine without perforation or abscess with bleeding: Secondary | ICD-10-CM | POA: Diagnosis present

## 2012-10-31 DIAGNOSIS — R1030 Lower abdominal pain, unspecified: Secondary | ICD-10-CM | POA: Diagnosis present

## 2012-10-31 DIAGNOSIS — N183 Chronic kidney disease, stage 3 unspecified: Secondary | ICD-10-CM | POA: Diagnosis present

## 2012-10-31 DIAGNOSIS — K921 Melena: Secondary | ICD-10-CM

## 2012-10-31 DIAGNOSIS — K635 Polyp of colon: Secondary | ICD-10-CM

## 2012-10-31 DIAGNOSIS — G40909 Epilepsy, unspecified, not intractable, without status epilepticus: Secondary | ICD-10-CM | POA: Diagnosis present

## 2012-10-31 DIAGNOSIS — I252 Old myocardial infarction: Secondary | ICD-10-CM

## 2012-10-31 DIAGNOSIS — E669 Obesity, unspecified: Secondary | ICD-10-CM | POA: Diagnosis present

## 2012-10-31 DIAGNOSIS — Z6841 Body Mass Index (BMI) 40.0 and over, adult: Secondary | ICD-10-CM | POA: Diagnosis present

## 2012-10-31 LAB — CBC WITH DIFFERENTIAL/PLATELET
Basophils Absolute: 0 10*3/uL (ref 0.0–0.1)
Basophils Relative: 0 % (ref 0–1)
Eosinophils Absolute: 0.1 10*3/uL (ref 0.0–0.7)
Eosinophils Relative: 1 % (ref 0–5)
HCT: 31.6 % — ABNORMAL LOW (ref 36.0–46.0)
Hemoglobin: 10.2 g/dL — ABNORMAL LOW (ref 12.0–15.0)
Lymphocytes Relative: 40 % (ref 12–46)
Lymphs Abs: 2.4 10*3/uL (ref 0.7–4.0)
MCH: 23.4 pg — ABNORMAL LOW (ref 26.0–34.0)
MCHC: 32.3 g/dL (ref 30.0–36.0)
MCV: 72.6 fL — ABNORMAL LOW (ref 78.0–100.0)
Monocytes Absolute: 0.3 10*3/uL (ref 0.1–1.0)
Monocytes Relative: 5 % (ref 3–12)
Neutro Abs: 3.3 10*3/uL (ref 1.7–7.7)
Neutrophils Relative %: 54 % (ref 43–77)
Platelets: 263 10*3/uL (ref 150–400)
RBC: 4.35 MIL/uL (ref 3.87–5.11)
RDW: 14.8 % (ref 11.5–15.5)
WBC: 6.1 10*3/uL (ref 4.0–10.5)

## 2012-10-31 LAB — GLUCOSE, CAPILLARY
Glucose-Capillary: 64 mg/dL — ABNORMAL LOW (ref 70–99)
Glucose-Capillary: 66 mg/dL — ABNORMAL LOW (ref 70–99)
Glucose-Capillary: 75 mg/dL (ref 70–99)
Glucose-Capillary: 98 mg/dL (ref 70–99)

## 2012-10-31 LAB — CBC
HCT: 27.7 % — ABNORMAL LOW (ref 36.0–46.0)
HCT: 26 % — ABNORMAL LOW (ref 36.0–46.0)
Hemoglobin: 8.9 g/dL — ABNORMAL LOW (ref 12.0–15.0)
Hemoglobin: 8.4 g/dL — ABNORMAL LOW (ref 12.0–15.0)
MCH: 23.4 pg — ABNORMAL LOW (ref 26.0–34.0)
MCH: 23.5 pg — ABNORMAL LOW (ref 26.0–34.0)
MCHC: 32.1 g/dL (ref 30.0–36.0)
MCHC: 32.3 g/dL (ref 30.0–36.0)
MCV: 72.7 fL — ABNORMAL LOW (ref 78.0–100.0)
MCV: 72.8 fL — ABNORMAL LOW (ref 78.0–100.0)
Platelets: 267 10*3/uL (ref 150–400)
Platelets: 239 10*3/uL (ref 150–400)
RBC: 3.81 MIL/uL — ABNORMAL LOW (ref 3.87–5.11)
RBC: 3.57 MIL/uL — ABNORMAL LOW (ref 3.87–5.11)
RDW: 14.9 % (ref 11.5–15.5)
RDW: 15 % (ref 11.5–15.5)
WBC: 5.9 10*3/uL (ref 4.0–10.5)
WBC: 5.5 10*3/uL (ref 4.0–10.5)

## 2012-10-31 LAB — COMPREHENSIVE METABOLIC PANEL
ALT: 13 U/L (ref 0–35)
AST: 14 U/L (ref 0–37)
Albumin: 3.2 g/dL — ABNORMAL LOW (ref 3.5–5.2)
Alkaline Phosphatase: 82 U/L (ref 39–117)
BUN: 20 mg/dL (ref 6–23)
CO2: 28 mEq/L (ref 19–32)
Calcium: 8.6 mg/dL (ref 8.4–10.5)
Chloride: 100 mEq/L (ref 96–112)
Creatinine, Ser: 0.9 mg/dL (ref 0.50–1.10)
GFR calc Af Amer: 77 mL/min — ABNORMAL LOW (ref >90)
GFR calc non Af Amer: 66 mL/min — ABNORMAL LOW (ref >90)
Glucose, Bld: 92 mg/dL (ref 70–99)
Potassium: 3.4 mEq/L — ABNORMAL LOW (ref 3.5–5.1)
Sodium: 135 mEq/L (ref 135–145)
Total Bilirubin: 0.2 mg/dL — ABNORMAL LOW (ref 0.3–1.2)
Total Protein: 6.8 g/dL (ref 6.0–8.3)

## 2012-10-31 LAB — LIPASE, BLOOD: Lipase: 18 U/L (ref 11–59)

## 2012-10-31 LAB — APTT: aPTT: 44 seconds — ABNORMAL HIGH (ref 24–37)

## 2012-10-31 LAB — SAMPLE TO BLOOD BANK

## 2012-10-31 LAB — PROTIME-INR
INR: 1.18 (ref 0.00–1.49)
Prothrombin Time: 14.8 seconds (ref 11.6–15.2)

## 2012-10-31 LAB — HEMOGLOBIN A1C
Hgb A1c MFr Bld: 6.2 % — ABNORMAL HIGH (ref <5.7)
Mean Plasma Glucose: 131 mg/dL — ABNORMAL HIGH (ref <117)

## 2012-10-31 MED ORDER — IOHEXOL 300 MG/ML  SOLN
50.0000 mL | Freq: Once | INTRAMUSCULAR | Status: AC | PRN
  Administered 2012-10-31: 50 mL via ORAL

## 2012-10-31 MED ORDER — ACETAMINOPHEN 650 MG RE SUPP: 650.0000 mg | Freq: Four times a day (QID) | RECTAL | Status: DC | PRN

## 2012-10-31 MED ORDER — ATORVASTATIN CALCIUM 10 MG PO TABS
10.0000 mg | ORAL_TABLET | Freq: Every evening | ORAL | Status: DC
  Administered 2012-11-01 – 2012-11-03 (×3): 10 mg via ORAL
  Filled 2012-10-31 (×5): qty 1

## 2012-10-31 MED ORDER — INSULIN ASPART 100 UNIT/ML ~~LOC~~ SOLN
0.0000 [IU] | Freq: Three times a day (TID) | SUBCUTANEOUS | Status: DC
  Administered 2012-11-01 – 2012-11-03 (×3): 2 [IU] via SUBCUTANEOUS
  Administered 2012-11-04: 3 [IU] via SUBCUTANEOUS
  Administered 2012-11-04: 2 [IU] via SUBCUTANEOUS
  Administered 2012-11-05 (×2): 3 [IU] via SUBCUTANEOUS

## 2012-10-31 MED ORDER — ONDANSETRON HCL 4 MG/2ML IJ SOLN
4.0000 mg | Freq: Once | INTRAMUSCULAR | Status: AC
  Administered 2012-10-31: 4 mg via INTRAVENOUS
  Filled 2012-10-31: qty 2

## 2012-10-31 MED ORDER — SODIUM CHLORIDE 0.9 % IJ SOLN
3.0000 mL | Freq: Two times a day (BID) | INTRAMUSCULAR | Status: DC
  Administered 2012-10-31 – 2012-11-03 (×6): 3 mL via INTRAVENOUS

## 2012-10-31 MED ORDER — FUROSEMIDE 40 MG PO TABS
40.0000 mg | ORAL_TABLET | Freq: Every evening | ORAL | Status: DC
  Administered 2012-11-01 – 2012-11-03 (×3): 40 mg via ORAL
  Filled 2012-10-31 (×5): qty 1

## 2012-10-31 MED ORDER — LAMOTRIGINE 150 MG PO TABS
150.0000 mg | ORAL_TABLET | Freq: Every day | ORAL | Status: DC
  Administered 2012-10-31 – 2012-11-04 (×5): 150 mg via ORAL
  Filled 2012-10-31 (×6): qty 1

## 2012-10-31 MED ORDER — ALBUTEROL SULFATE HFA 108 (90 BASE) MCG/ACT IN AERS: 2.0000 | INHALATION_SPRAY | Freq: Four times a day (QID) | RESPIRATORY_TRACT | Status: DC | PRN

## 2012-10-31 MED ORDER — LAMOTRIGINE 100 MG PO TABS
100.0000 mg | ORAL_TABLET | Freq: Every day | ORAL | Status: DC
  Administered 2012-11-01 – 2012-11-05 (×5): 100 mg via ORAL
  Filled 2012-10-31 (×5): qty 1

## 2012-10-31 MED ORDER — MORPHINE SULFATE 4 MG/ML IJ SOLN
4.0000 mg | Freq: Once | INTRAMUSCULAR | Status: AC
  Administered 2012-10-31: 4 mg via INTRAVENOUS
  Filled 2012-10-31: qty 1

## 2012-10-31 MED ORDER — ONDANSETRON HCL 4 MG PO TABS: 4.0000 mg | ORAL_TABLET | Freq: Four times a day (QID) | ORAL | Status: DC | PRN

## 2012-10-31 MED ORDER — LAMOTRIGINE 100 MG PO TABS: 100.0000 mg | ORAL_TABLET | Freq: Two times a day (BID) | ORAL | Status: DC

## 2012-10-31 MED ORDER — IOHEXOL 300 MG/ML  SOLN
100.0000 mL | Freq: Once | INTRAMUSCULAR | Status: AC | PRN
  Administered 2012-10-31: 100 mL via INTRAVENOUS

## 2012-10-31 MED ORDER — NEBIVOLOL HCL 10 MG PO TABS
10.0000 mg | ORAL_TABLET | Freq: Every day | ORAL | Status: DC
  Administered 2012-11-01 – 2012-11-05 (×5): 10 mg via ORAL
  Filled 2012-10-31 (×5): qty 1

## 2012-10-31 MED ORDER — SODIUM CHLORIDE 0.9 % IV SOLN
INTRAVENOUS | Status: DC
  Administered 2012-10-31: 13:00:00 via INTRAVENOUS

## 2012-10-31 MED ORDER — ARIPIPRAZOLE 10 MG PO TABS
10.0000 mg | ORAL_TABLET | Freq: Every day | ORAL | Status: DC
  Administered 2012-11-01 – 2012-11-05 (×5): 10 mg via ORAL
  Filled 2012-10-31 (×5): qty 1

## 2012-10-31 MED ORDER — SERTRALINE HCL 100 MG PO TABS
100.0000 mg | ORAL_TABLET | Freq: Every day | ORAL | Status: DC
  Administered 2012-10-31 – 2012-11-05 (×6): 100 mg via ORAL
  Filled 2012-10-31 (×6): qty 1

## 2012-10-31 MED ORDER — PANTOPRAZOLE SODIUM 40 MG IV SOLR
40.0000 mg | INTRAVENOUS | Status: DC
  Administered 2012-10-31 – 2012-11-04 (×5): 40 mg via INTRAVENOUS
  Filled 2012-10-31 (×6): qty 40

## 2012-10-31 MED ORDER — POTASSIUM CHLORIDE IN NACL 20-0.9 MEQ/L-% IV SOLN
INTRAVENOUS | Status: DC
  Administered 2012-10-31: 18:00:00 via INTRAVENOUS
  Filled 2012-10-31 (×2): qty 1000

## 2012-10-31 MED ORDER — ACETAMINOPHEN 325 MG PO TABS: 650.0000 mg | ORAL_TABLET | Freq: Four times a day (QID) | ORAL | Status: DC | PRN

## 2012-10-31 MED ORDER — ONDANSETRON HCL 4 MG/2ML IJ SOLN
4.0000 mg | Freq: Four times a day (QID) | INTRAMUSCULAR | Status: DC | PRN
  Administered 2012-10-31: 4 mg via INTRAVENOUS
  Filled 2012-10-31: qty 2

## 2012-10-31 MED ORDER — ALBUTEROL SULFATE (5 MG/ML) 0.5% IN NEBU: 2.5000 mg | INHALATION_SOLUTION | RESPIRATORY_TRACT | Status: DC | PRN

## 2012-10-31 MED ORDER — SODIUM CHLORIDE 0.9 % IV BOLUS (SEPSIS)
500.0000 mL | Freq: Once | INTRAVENOUS | Status: AC
  Administered 2012-10-31: 500 mL via INTRAVENOUS

## 2012-10-31 MED ORDER — INSULIN GLARGINE 100 UNIT/ML ~~LOC~~ SOLN
30.0000 [IU] | Freq: Every day | SUBCUTANEOUS | Status: DC
  Filled 2012-10-31: qty 0.3

## 2012-10-31 MED ORDER — KCL IN DEXTROSE-NACL 20-5-0.9 MEQ/L-%-% IV SOLN
INTRAVENOUS | Status: AC
  Administered 2012-10-31: 22:00:00 via INTRAVENOUS
  Filled 2012-10-31 (×2): qty 1000

## 2012-10-31 MED ORDER — MORPHINE SULFATE 2 MG/ML IJ SOLN
2.0000 mg | INTRAMUSCULAR | Status: DC | PRN
  Administered 2012-10-31 – 2012-11-02 (×5): 2 mg via INTRAVENOUS
  Filled 2012-10-31 (×5): qty 1

## 2012-10-31 NOTE — ED Notes (Signed)
Patient ambulated with no difficulty.  

## 2012-10-31 NOTE — H&P (Addendum)
[facility] History and Physical  [name]:[id number] DOB: 01/10/1946 DOA: 10/31/2012   PCP: Alva Garnet., MD  Specialists: She is followed by Dr. Dulce Sellar, who is her gastroenterologist. Dr. Lucianne Muss, is her endocrinologist.  Chief Complaint: Bright red blood per rectum  HPI: [name] is a 67 y.o. female with a past medical history of diverticular bleed, for which she was hospitalized in February of this year. She has a history of, diabetes, hypertension, hypercholesterolemia, obesity, heart murmur, seizure disorder, who was in her usual state of health till about 3:30 this morning when she woke up and had large amount of rectal bleeding. It was bright red blood. She had this about 6 times at home. She denies getting dizzy or lightheaded. However, started having lower abdominal pain. The pain is 8 or 10 in intensity without any radiation. She denies vomiting blood, however, did have one episode of vomiting. She is nauseous. Has headache. No fever, no chills. She had a colonoscopy during her last hospitalization. It revealed a diverticulosis in the left side. There was poor prep on the right side. A descending colon polyp was seen. No active bleeding was noted.  Home Medications: Prior to Admission medications   Medication Sig Start Date End Date Taking? Authorizing Provider  albuterol (PROVENTIL HFA;VENTOLIN HFA) 108 (90 BASE) MCG/ACT inhaler Inhale 2 puffs into the lungs every 6 (six) hours as needed for wheezing.   Yes Historical Provider, MD  ARIPiprazole (ABILIFY) 10 MG tablet Take 10 mg by mouth daily.   Yes Historical Provider, MD  aspirin EC 81 MG tablet Take 1 tablet (81 mg total) by mouth at bedtime. 08/16/12  Yes Adeline Joselyn Glassman, MD  atorvastatin (LIPITOR) 10 MG tablet Take 10 mg by mouth every evening.    Yes Historical Provider, MD  ferrous gluconate (FERGON) 324 MG tablet Take 324 mg by mouth every evening.    Yes Historical Provider, MD  furosemide (LASIX)  40 MG tablet Take 40 mg by mouth every evening.    Yes Historical Provider, MD  insulin glargine (LANTUS) 100 UNIT/ML injection Inject 30 Units into the skin daily.    Yes Historical Provider, MD  insulin lispro (HUMALOG) 100 UNIT/ML injection Inject 6 Units into the skin daily before supper.   Yes Historical Provider, MD  isosorbide dinitrate (ISORDIL) 30 MG tablet Take 30 mg by mouth every morning.    Yes Historical Provider, MD  lamoTRIgine (LAMICTAL) 100 MG tablet Take 100-150 mg by mouth 2 (two) times daily. 1 tab in am, 1.5 tab in pm   Yes Historical Provider, MD  Liraglutide (VICTOZA) 18 MG/3ML SOLN Inject 1.8 mg into the skin daily with supper. If CBG > 130   Yes Historical Provider, MD  Magnesium ([employer]S MAGNESIUM) 250 MG TABS Take 500 mg by mouth daily.   Yes Historical Provider, MD  meloxicam (MOBIC) 15 MG tablet Take 15 mg by mouth daily.   Yes Historical Provider, MD  nebivolol (BYSTOLIC) 10 MG tablet Take 10 mg by mouth daily.     Yes Historical Provider, MD  olmesartan (BENICAR) 40 MG tablet Take 40 mg by mouth daily.   Yes Historical Provider, MD  omega-3 acid ethyl esters (LOVAZA) 1 G capsule Take 1 g by mouth daily.   Yes Historical Provider, MD  Pancrelipase, Lip-Prot-Amyl, 25000 UNITS CPEP Take 2 capsules by mouth 3 (three) times daily.    Yes Historical Provider, MD  potassium chloride SA (K-DUR,KLOR-CON) 20 MEQ tablet Take 20 mEq by  mouth daily.     Yes Historical Provider, MD  sertraline (ZOLOFT) 100 MG tablet Take 100 mg by mouth daily.    Yes Historical Provider, MD  traMADol (ULTRAM) 50 MG tablet Take 50 mg by mouth every 6 (six) hours as needed for pain.   Yes Historical Provider, MD    Allergies:  Allergies  Allergen Reactions  . Codeine     Headache and "just feel off"  . Penicillins Hives  . Sulfa Antibiotics Hives  . Other Rash    Bleach    Past Medical History: Past Medical History  Diagnosis Date  . Myocardial infarction   . Respiratory failure       multifactorial secondary to congestive heart failure, morbid obesity, decreased abdominal  compliance in the setting of pancreatitis.  . Diastolic heart failure   . Leukocytosis     secondary to steroid and pancreatitis.  . DM (diabetes mellitus)   . HTN (hypertension)   . Seizure disorder   . Hyperlipidemia   . Back pain   . Obstructive sleep apnea   . Empty sella     on MRI in 2009.  . Diverticulitis   . Abnormal liver function      in the past.  . Headache   . Pneumonia   . Stroke   . Seizures   . Pancreatitis   . Family history of anesthesia complication     History of a seizure  . Anemia   . Depression     Past Surgical History  Procedure Laterality Date  . Cholecystectomy    . Abdominal hysterectomy    . Tubal ligation    . Tonsillectomy    . Appendectomy    . Cardiac catheterization      jan 2012  . Rectal polypectomy    . Eye cysts    . Back surgery    . Colonoscopy N/A 08/15/2012    Procedure: COLONOSCOPY;  Surgeon: Willis Modena, MD;  Location: [facility];  Service: Endoscopy;  Laterality: N/A;    Social History:  reports that she quit smoking about 30 years ago. Her smoking use included Cigarettes. She has a 12.5 pack-year smoking history. She has never used smokeless tobacco. She reports that she does not drink alcohol or use illicit drugs.  Living Situation: She lives with her sister Activity Level: She does need a cane occasionally   Family History:  Family History  Problem Relation Age of Onset  . Allergies Father   . Heart disease Father     before 2  . Heart failure Father   . Hypertension Father   . Hyperlipidemia Father   . Heart disease Mother   . Diabetes Mother   . Hypertension Mother   . Hyperlipidemia Mother   . Hypertension Sister   . Heart disease Sister     before 53  . Hyperlipidemia Sister   . Cancer Other      Review of Systems - History obtained from the patient General ROS: positive for  - fatigue Psychological  ROS: positive for - anxiety Ophthalmic ROS: negative ENT ROS: negative Allergy and Immunology ROS: negative Hematological and Lymphatic ROS: negative Endocrine ROS: negative Respiratory ROS: no cough, shortness of breath, or wheezing Cardiovascular ROS: no chest pain or dyspnea on exertion Gastrointestinal ROS: as in hpi Genito-Urinary ROS: no dysuria, trouble voiding, or hematuria Musculoskeletal ROS: negative Neurological ROS: no TIA or stroke symptoms Dermatological ROS: negative  Physical Examination  Filed Vitals:   10/31/12  1406 10/31/12 1408 10/31/12 1410 10/31/12 1510  BP: 121/73 127/69 123/72 120/42  Pulse: 72 66 72 64  Temp:    98.4 F (36.9 C)  TempSrc:    Oral  Resp: 18 18 18 16   SpO2:  97% 96% 94%    General appearance: alert, cooperative, appears stated age and no distress Head: Normocephalic, without obvious abnormality, atraumatic Eyes: conjunctivae/corneas clear. PERRL, EOM's intact.  Throat: lips, mucosa, and tongue normal; teeth and gums normal Neck: no adenopathy, no carotid bruit, no JVD, supple, symmetrical, trachea midline and thyroid not enlarged, symmetric, no tenderness/mass/nodules Resp: clear to auscultation bilaterally Cardio: regular rate and rhythm, S1, S2 normal. Systolic murmur appreciated at the apex. no click, rub or gallop GI: Abdomen is soft. There is tenderness in the lower quadrants bilaterally. No rebound, rigidity, or guarding. No masses, organomegaly. She is obese, so, examination is limited. Bowel sounds are present. Rectal exam was done by ED physician and the red blood was noted in the rectum. Extremities: varicose veins noted Pulses: 2+ and symmetric Skin: Skin color, texture, turgor normal. No rashes or lesions Lymph nodes: Cervical, supraclavicular, and axillary nodes normal. Neurologic: She is alert and oriented x3. No focal neurological deficits are noted  Laboratory Data: Results for orders placed during the hospital  encounter of 10/31/12 (from the past 48 hour(s))  CBC WITH DIFFERENTIAL     Status: Abnormal   Collection Time    10/31/12 12:59 PM      Result Value Range   WBC 6.1  4.0 - 10.5 K/uL   RBC 4.35  3.87 - 5.11 MIL/uL   Hemoglobin 10.2 (*) 12.0 - 15.0 g/dL   HCT 16.1 (*) 09.6 - 04.5 %   MCV 72.6 (*) 78.0 - 100.0 fL   MCH 23.4 (*) 26.0 - 34.0 pg   MCHC 32.3  30.0 - 36.0 g/dL   RDW 40.9  81.1 - 91.4 %   Platelets 263  150 - 400 K/uL   Neutrophils Relative 54  43 - 77 %   Lymphocytes Relative 40  12 - 46 %   Monocytes Relative 5  3 - 12 %   Eosinophils Relative 1  0 - 5 %   Basophils Relative 0  0 - 1 %   Neutro Abs 3.3  1.7 - 7.7 K/uL   Lymphs Abs 2.4  0.7 - 4.0 K/uL   Monocytes Absolute 0.3  0.1 - 1.0 K/uL   Eosinophils Absolute 0.1  0.0 - 0.7 K/uL   Basophils Absolute 0.0  0.0 - 0.1 K/uL   Smear Review MORPHOLOGY UNREMARKABLE    COMPREHENSIVE METABOLIC PANEL     Status: Abnormal   Collection Time    10/31/12 12:59 PM      Result Value Range   Sodium 135  135 - 145 mEq/L   Potassium 3.4 (*) 3.5 - 5.1 mEq/L   Chloride 100  96 - 112 mEq/L   CO2 28  19 - 32 mEq/L   Glucose, Bld 92  70 - 99 mg/dL   BUN 20  6 - 23 mg/dL   Creatinine, Ser 7.82  0.50 - 1.10 mg/dL   Calcium 8.6  8.4 - 95.6 mg/dL   Total Protein 6.8  6.0 - 8.3 g/dL   Albumin 3.2 (*) 3.5 - 5.2 g/dL   AST 14  0 - 37 U/L   ALT 13  0 - 35 U/L   Alkaline Phosphatase 82  39 - 117 U/L   Total Bilirubin  0.2 (*) 0.3 - 1.2 mg/dL   GFR calc non Af Amer 66 (*) >90 mL/min   GFR calc Af Amer 77 (*) >90 mL/min   Comment:            The eGFR has been calculated     using the CKD EPI equation.     This calculation has not been     validated in all clinical     situations.     eGFR's persistently     <90 mL/min signify     possible Chronic Kidney Disease.  SAMPLE TO BLOOD BANK     Status: None   Collection Time    10/31/12 12:59 PM      Result Value Range   Blood Bank Specimen SAMPLE AVAILABLE FOR TESTING     Sample  Expiration 11/03/2012      Radiology Reports: No results found.  Problem List  Principal Problem:   Hematochezia Active Problems:   Hypertension   Obesity   Anemia associated with acute blood loss   Lower abdominal pain   DM type 2 (diabetes mellitus, type 2)   Seizure disorder   Diverticulosis   Assessment: This is a 67 year old, African American female, who presents with hematochezia. This is most likely a diverticular bleed, however, she does have lower abdominal pain, and tenderness. That is very unusual for a diverticular bleed. Differential diagnoses include ischemic colitis, AVMs. This is unlikely to be an upper GI bleed.  Plan: #1 hematochezia: Continue to monitor closely. CBCs will be checked every 6 hours. PT and INR will be checked. She'll be transfused as needed. If this is diverticular it should be a self-limiting bleed. If she has persistent bleeding she may require a bleeding scan. Have discussed with gastroenterology (Dr. Ewing Schlein) and they will see her tomorrow but will see earlier if needed. PPI. Reassuringly she is hemodynamically stable.  #2 lower abdominal pain: Etiology is unclear. She could have some cramping pain from the bleeding itself. Since etiology is unclear we will proceed with a CT scan. Her history of infected pancreatic pseudocyst is noted. However, her pain is in the lower abdomen and unlikely to be related to the pancreas.  #3 anemia secondary to blood loss. Hemoglobin actually is close to her baseline. However, it is possible that she may be hemoconcentrated. We will continue to monitor CBCs closely, and transfuse as needed.  #4 diabetes, type II: Continue with Lantus. We'll give her clear liquids. Sliding scale insulin coverage will be provided.  #5 hypokalemia: This will be repleted intravenously.  #6 history of hypertension, and chronic diastolic heart failure: These issues appear to be stable. Resume her home medications when her acute GI issues  have resolved to avoid hypotensive episodes.  #7 history of seizure disorder: Continue with lamotrigine  DVT Prophylaxis: SCDs Code Status: Full code Family Communication: Discussed with the patient and her sister  Disposition Plan:  Will be admitted to telemetry.   Further management decisions will depend on results of further testing and patient's response to treatment.  [facility]  [facility] Pager [phone]  If 7PM-7AM, please contact night-coverage [email or web address] Password [facility]  10/31/2012, 3:23 PM

## 2012-10-31 NOTE — Progress Notes (Signed)
Hypoglycemic Event  CBG: 64  Treatment: 15 GM carbohydrate snack  Symptoms: None  Follow-up CBG: Time 1746 CBG Result:66  Possible Reasons for Event: Inadequate meal intake  Comments/MD notified:text sent to Dr Rito Ehrlich, Ali Lowe, Darreld Mclean Ward  Remember to initiate Hypoglycemia Order Set & complete

## 2012-10-31 NOTE — Progress Notes (Signed)
UR completed 

## 2012-10-31 NOTE — ED Provider Notes (Signed)
History     CSN: [id number]  Arrival date & time 10/31/12  1101   First MD Initiated Contact with Patient 10/31/12 1223      Chief Complaint  Patient presents with  . Rectal Bleeding    (Consider location/radiation/quality/duration/timing/severity/associated sxs/prior treatment) HPI Comments: [name] is a 67 y.o. female who reports onset of bleeding per rectum, with bright red blood, and clots, at 3 AM today. It has happened several times. She denies preceding, diarrhea, or constipation. She denies fever, chills, nausea, vomiting. She has a persistent, abdominal pain. That started this morning. She has recently had rectal bleeding, from, and this was treated by excision of polyps at colonoscopy. She denies chest pain, shortness of breath, weakness, or dizziness. She does not take anticoagulants. There are no known modifying factors.  Patient is a 67 y.o. female presenting with hematochezia. The history is provided by the patient.  Rectal Bleeding     Past Medical History  Diagnosis Date  . Myocardial infarction   . Respiratory failure      multifactorial secondary to congestive heart failure, morbid obesity, decreased abdominal  compliance in the setting of pancreatitis.  . Diastolic heart failure   . Leukocytosis     secondary to steroid and pancreatitis.  . DM (diabetes mellitus)   . HTN (hypertension)   . Seizure disorder   . Hyperlipidemia   . Back pain   . Obstructive sleep apnea   . Empty sella     on MRI in 2009.  . Diverticulitis   . Abnormal liver function      in the past.  . Headache   . Pneumonia   . Stroke   . Seizures   . Pancreatitis   . Family history of anesthesia complication     History of a seizure  . Anemia   . Depression     Past Surgical History  Procedure Laterality Date  . Cholecystectomy    . Abdominal hysterectomy    . Tubal ligation    . Tonsillectomy    . Appendectomy    . Cardiac catheterization      jan 2012  . Rectal  polypectomy    . Eye cysts    . Back surgery    . Colonoscopy N/A 08/15/2012    Procedure: COLONOSCOPY;  Surgeon: Willis Modena, MD;  Location: [facility];  Service: Endoscopy;  Laterality: N/A;    Family History  Problem Relation Age of Onset  . Allergies Father   . Heart disease Father     before 36  . Heart failure Father   . Hypertension Father   . Hyperlipidemia Father   . Heart disease Mother   . Diabetes Mother   . Hypertension Mother   . Hyperlipidemia Mother   . Hypertension Sister   . Heart disease Sister     before 92  . Hyperlipidemia Sister   . Cancer Other     History  Substance Use Topics  . Smoking status: Former Smoker -- 0.50 packs/day for 25 years    Types: Cigarettes    Quit date: 03/21/1982  . Smokeless tobacco: Never Used  . Alcohol Use: No    OB History   Grav Para Term Preterm Abortions TAB SAB Ect Mult Living                  Review of Systems  Gastrointestinal: Positive for hematochezia.  All other systems reviewed and are negative.    Allergies  Codeine; Penicillins; Sulfa antibiotics; and Other  Home Medications   Current Outpatient Rx  Name  Route  Sig  Dispense  Refill  . albuterol (PROVENTIL HFA;VENTOLIN HFA) 108 (90 BASE) MCG/ACT inhaler   Inhalation   Inhale 2 puffs into the lungs every 6 (six) hours as needed for wheezing.         . ARIPiprazole (ABILIFY) 10 MG tablet   Oral   Take 10 mg by mouth daily.         Marland Kitchen aspirin EC 81 MG tablet   Oral   Take 1 tablet (81 mg total) by mouth at bedtime.           NO ASPIRIN 1WEEK, THEN RESUME.   Marland Kitchen atorvastatin (LIPITOR) 10 MG tablet   Oral   Take 10 mg by mouth every evening.          . ferrous gluconate (FERGON) 324 MG tablet   Oral   Take 324 mg by mouth every evening.          . furosemide (LASIX) 40 MG tablet   Oral   Take 40 mg by mouth every evening.          . insulin glargine (LANTUS) 100 UNIT/ML injection   Subcutaneous   Inject 30 Units  into the skin daily.          . insulin lispro (HUMALOG) 100 UNIT/ML injection   Subcutaneous   Inject 6 Units into the skin daily before supper.         . isosorbide dinitrate (ISORDIL) 30 MG tablet   Oral   Take 30 mg by mouth every morning.          . lamoTRIgine (LAMICTAL) 100 MG tablet   Oral   Take 100-150 mg by mouth 2 (two) times daily. 1 tab in am, 1.5 tab in pm         . Liraglutide (VICTOZA) 18 MG/3ML SOLN   Subcutaneous   Inject 1.8 mg into the skin daily with supper. If CBG > 130         . Magnesium ([employer]S MAGNESIUM) 250 MG TABS   Oral   Take 500 mg by mouth daily.         . meloxicam (MOBIC) 15 MG tablet   Oral   Take 15 mg by mouth daily.         . nebivolol (BYSTOLIC) 10 MG tablet   Oral   Take 10 mg by mouth daily.           Marland Kitchen olmesartan (BENICAR) 40 MG tablet   Oral   Take 40 mg by mouth daily.         Marland Kitchen omega-3 acid ethyl esters (LOVAZA) 1 G capsule   Oral   Take 1 g by mouth daily.         . Pancrelipase, Lip-Prot-Amyl, 25000 UNITS CPEP   Oral   Take 2 capsules by mouth 3 (three) times daily.          . potassium chloride SA (K-DUR,KLOR-CON) 20 MEQ tablet   Oral   Take 20 mEq by mouth daily.           . sertraline (ZOLOFT) 100 MG tablet   Oral   Take 100 mg by mouth daily.          . traMADol (ULTRAM) 50 MG tablet   Oral   Take 50 mg by mouth every 6 (six) hours as needed for pain.  BP 123/72  Pulse 72  Temp(Src) 98.2 F (36.8 C) (Oral)  Resp 18  SpO2 96%  Physical Exam  Nursing note and vitals reviewed. Constitutional: She is oriented to person, place, and time. She appears well-developed.  Obese  HENT:  Head: Normocephalic and atraumatic.  Eyes: Conjunctivae and EOM are normal. Pupils are equal, round, and reactive to light.  Neck: Normal range of motion and phonation normal. Neck supple.  Cardiovascular: Normal rate, regular rhythm and intact distal pulses.   Pulmonary/Chest: Effort  normal and breath sounds normal. She exhibits no tenderness.  Abdominal: Soft. She exhibits no distension and no mass. There is tenderness (Mild diffuse tenderness). There is no rebound and no guarding.  Genitourinary:  Normal anus. Small amt. Red blood in rectum. No mass.  Musculoskeletal: Normal range of motion.  Neurological: She is alert and oriented to person, place, and time. She has normal strength. She exhibits normal muscle tone.  Skin: Skin is warm and dry.  Psychiatric: She has a normal mood and affect. Her behavior is normal. Judgment and thought content normal.    ED Course  Procedures (including critical care time)   Patient Vitals for the past 24 hrs:  BP Temp Temp src Pulse Resp SpO2  10/31/12 1410 123/72 mmHg - - 72 18 96 %  10/31/12 1408 127/69 mmHg - - 66 18 97 %  10/31/12 1406 121/73 mmHg - - 72 18 -  10/31/12 1111 134/78 mmHg 98.2 F (36.8 C) Oral 74 18 97 %    Medications  0.9 %  sodium chloride infusion ( Intravenous New Bag/Given 10/31/12 1254)  sodium chloride 0.9 % bolus 500 mL (0 mLs Intravenous Stopped 10/31/12 1254)    2:36 PM Reevaluation with update and discussion. After initial assessment and treatment, an updated evaluation reveals she continues to have abdominal cramping. She states she went to the bathroom, and had a bowel movement consisting of pure blood. Orthostatic blood & pressure pulses were normal. Emmakate Hypes L   14:35- additional medication, ordered  2:36 PM-Consult complete with Dr. Barnie Del. Patient case explained and discussed. He agrees to admit patient for further evaluation and treatment. Call ended at 14:45  Labs Reviewed  CBC WITH DIFFERENTIAL - Abnormal; Notable for the following:    Hemoglobin 10.2 (*)    HCT 31.6 (*)    MCV 72.6 (*)    MCH 23.4 (*)    All other components within normal limits  COMPREHENSIVE METABOLIC PANEL - Abnormal; Notable for the following:    Potassium 3.4 (*)    Albumin 3.2 (*)    Total  Bilirubin 0.2 (*)    GFR calc non Af Amer 66 (*)    GFR calc Af Amer 77 (*)    All other components within normal limits  SAMPLE TO BLOOD BANK   No results found.   1. Rectal bleeding   2. Diverticulosis   3. Colon polyp       MDM  Rectal bleeding, recurrent. Patient had colonoscopy February 2014 following an episode of bleeding. At that time a polypectomy was done. It was suspected that she had a diverticular bleed as she has extensive diverticulosis. Her hemoglobin is improved from the baseline in February. Most likely, this represents recurrent diverticular bleeding. She is hemodynamically stable.     Plan : Admit  Flint Melter, MD 10/31/12 [phone]

## 2012-10-31 NOTE — ED Notes (Signed)
Patient with h/o polyps presents with rectal bleeding.  Patient reports bright red blood.  No hemorrhoids reported.

## 2012-10-31 NOTE — ED Notes (Signed)
Waiting to transport patient until after CT has been completed.

## 2012-11-01 DIAGNOSIS — I2510 Atherosclerotic heart disease of native coronary artery without angina pectoris: Secondary | ICD-10-CM

## 2012-11-01 DIAGNOSIS — K625 Hemorrhage of anus and rectum: Secondary | ICD-10-CM

## 2012-11-01 DIAGNOSIS — K5730 Diverticulosis of large intestine without perforation or abscess without bleeding: Secondary | ICD-10-CM

## 2012-11-01 LAB — CBC
HCT: 25.8 % — ABNORMAL LOW (ref 36.0–46.0)
HCT: 25.6 % — ABNORMAL LOW (ref 36.0–46.0)
Hemoglobin: 8.1 g/dL — ABNORMAL LOW (ref 12.0–15.0)
Hemoglobin: 7.7 g/dL — ABNORMAL LOW (ref 12.0–15.0)
MCH: 23.3 pg — ABNORMAL LOW (ref 26.0–34.0)
MCH: 22.2 pg — ABNORMAL LOW (ref 26.0–34.0)
MCHC: 31.4 g/dL (ref 30.0–36.0)
MCHC: 30.1 g/dL (ref 30.0–36.0)
MCV: 74.1 fL — ABNORMAL LOW (ref 78.0–100.0)
MCV: 73.8 fL — ABNORMAL LOW (ref 78.0–100.0)
Platelets: 230 10*3/uL (ref 150–400)
Platelets: 224 10*3/uL (ref 150–400)
RBC: 3.48 MIL/uL — ABNORMAL LOW (ref 3.87–5.11)
RBC: 3.47 MIL/uL — ABNORMAL LOW (ref 3.87–5.11)
RDW: 15.3 % (ref 11.5–15.5)
RDW: 15.1 % (ref 11.5–15.5)
WBC: 4.7 10*3/uL (ref 4.0–10.5)
WBC: 5.6 10*3/uL (ref 4.0–10.5)

## 2012-11-01 LAB — URINALYSIS, ROUTINE W REFLEX MICROSCOPIC
Bilirubin Urine: NEGATIVE
Glucose, UA: NEGATIVE mg/dL
Ketones, ur: NEGATIVE mg/dL
Leukocytes, UA: NEGATIVE
Nitrite: NEGATIVE
Protein, ur: NEGATIVE mg/dL
Specific Gravity, Urine: 1.015 (ref 1.005–1.030)
Urobilinogen, UA: 0.2 mg/dL (ref 0.0–1.0)
pH: 5 (ref 5.0–8.0)

## 2012-11-01 LAB — COMPREHENSIVE METABOLIC PANEL
ALT: 11 U/L (ref 0–35)
AST: 15 U/L (ref 0–37)
Albumin: 2.7 g/dL — ABNORMAL LOW (ref 3.5–5.2)
Alkaline Phosphatase: 64 U/L (ref 39–117)
BUN: 12 mg/dL (ref 6–23)
CO2: 26 mEq/L (ref 19–32)
Calcium: 8.2 mg/dL — ABNORMAL LOW (ref 8.4–10.5)
Chloride: 107 mEq/L (ref 96–112)
Creatinine, Ser: 0.88 mg/dL (ref 0.50–1.10)
GFR calc Af Amer: 79 mL/min — ABNORMAL LOW (ref >90)
GFR calc non Af Amer: 68 mL/min — ABNORMAL LOW (ref >90)
Glucose, Bld: 117 mg/dL — ABNORMAL HIGH (ref 70–99)
Potassium: 4.5 mEq/L (ref 3.5–5.1)
Sodium: 138 mEq/L (ref 135–145)
Total Bilirubin: 0.2 mg/dL — ABNORMAL LOW (ref 0.3–1.2)
Total Protein: 5.8 g/dL — ABNORMAL LOW (ref 6.0–8.3)

## 2012-11-01 LAB — GLUCOSE, CAPILLARY
Glucose-Capillary: 120 mg/dL — ABNORMAL HIGH (ref 70–99)
Glucose-Capillary: 142 mg/dL — ABNORMAL HIGH (ref 70–99)
Glucose-Capillary: 78 mg/dL (ref 70–99)
Glucose-Capillary: 112 mg/dL — ABNORMAL HIGH (ref 70–99)

## 2012-11-01 LAB — URINE MICROSCOPIC-ADD ON

## 2012-11-01 NOTE — Consult Note (Signed)
Referring Provider: Dr. Rito Ehrlich Primary Care Physician:  Alva Garnet., MD Primary Gastroenterologist:  Dr. Dulce Sellar  Reason for Consultation:  Rectal bleeding  HPI: [name] is a 67 y.o. female with a history of diverticular bleeding in 2/14 and had a colonoscopy at that time, which showed left-sided diverticulosis and fair prep on the right side without active bleeding (small polyp also removed from descending colon), who is presenting with the acute onset of bright red blood per rectum with loose stools and intense nausea yesterday. After the bleeding started she then started having lower abdominal pain that was "achy" and at times sharp. Happened multiple times at home. Blood was bright red and denies black stools or maroon-colored stools. Has had 3 episodes overnight that were red clots with minimal stool but the amount of blood was similar to what it was at home. Denies dizziness. One episode of vomiting at home that was nonbloody.    Past Medical History  Diagnosis Date  . Myocardial infarction   . Respiratory failure      multifactorial secondary to congestive heart failure, morbid obesity, decreased abdominal  compliance in the setting of pancreatitis.  . Diastolic heart failure   . Leukocytosis     secondary to steroid and pancreatitis.  . DM (diabetes mellitus)   . HTN (hypertension)   . Seizure disorder   . Hyperlipidemia   . Back pain   . Obstructive sleep apnea   . Empty sella     on MRI in 2009.  . Diverticulitis   . Abnormal liver function      in the past.  . Headache   . Pneumonia   . Stroke   . Seizures   . Pancreatitis   . Family history of anesthesia complication     History of a seizure  . Anemia   . Depression     Past Surgical History  Procedure Laterality Date  . Cholecystectomy    . Abdominal hysterectomy    . Tubal ligation    . Tonsillectomy    . Appendectomy    . Cardiac catheterization      jan 2012  . Rectal polypectomy     . Eye cysts    . Back surgery    . Colonoscopy N/A 08/15/2012    Procedure: COLONOSCOPY;  Surgeon: Willis Modena, MD;  Location: [facility];  Service: Endoscopy;  Laterality: N/A;    Prior to Admission medications   Medication Sig Start Date End Date Taking? Authorizing Provider  albuterol (PROVENTIL HFA;VENTOLIN HFA) 108 (90 BASE) MCG/ACT inhaler Inhale 2 puffs into the lungs every 6 (six) hours as needed for wheezing.   Yes Historical Provider, MD  ARIPiprazole (ABILIFY) 10 MG tablet Take 10 mg by mouth daily.   Yes Historical Provider, MD  aspirin EC 81 MG tablet Take 1 tablet (81 mg total) by mouth at bedtime. 08/16/12  Yes Adeline Joselyn Glassman, MD  atorvastatin (LIPITOR) 10 MG tablet Take 10 mg by mouth every evening.    Yes Historical Provider, MD  ferrous gluconate (FERGON) 324 MG tablet Take 324 mg by mouth every evening.    Yes Historical Provider, MD  furosemide (LASIX) 40 MG tablet Take 40 mg by mouth every evening.    Yes Historical Provider, MD  insulin glargine (LANTUS) 100 UNIT/ML injection Inject 30 Units into the skin daily.    Yes Historical Provider, MD  insulin lispro (HUMALOG) 100 UNIT/ML injection Inject 6 Units into the skin daily before supper.  Yes Historical Provider, MD  isosorbide dinitrate (ISORDIL) 30 MG tablet Take 30 mg by mouth every morning.    Yes Historical Provider, MD  lamoTRIgine (LAMICTAL) 100 MG tablet Take 100-150 mg by mouth 2 (two) times daily. 1 tab in am, 1.5 tab in pm   Yes Historical Provider, MD  Liraglutide (VICTOZA) 18 MG/3ML SOLN Inject 1.8 mg into the skin daily with supper. If CBG > 130   Yes Historical Provider, MD  Magnesium ([facility] MAGNESIUM) 250 MG TABS Take 500 mg by mouth daily.   Yes Historical Provider, MD  meloxicam (MOBIC) 15 MG tablet Take 15 mg by mouth daily.   Yes Historical Provider, MD  nebivolol (BYSTOLIC) 10 MG tablet Take 10 mg by mouth daily.     Yes Historical Provider, MD  olmesartan (BENICAR) 40 MG tablet Take 40 mg by  mouth daily.   Yes Historical Provider, MD  omega-3 acid ethyl esters (LOVAZA) 1 G capsule Take 1 g by mouth daily.   Yes Historical Provider, MD  Pancrelipase, Lip-Prot-Amyl, 25000 UNITS CPEP Take 2 capsules by mouth 3 (three) times daily.    Yes Historical Provider, MD  potassium chloride SA (K-DUR,KLOR-CON) 20 MEQ tablet Take 20 mEq by mouth daily.     Yes Historical Provider, MD  sertraline (ZOLOFT) 100 MG tablet Take 100 mg by mouth daily.    Yes Historical Provider, MD  traMADol (ULTRAM) 50 MG tablet Take 50 mg by mouth every 6 (six) hours as needed for pain.   Yes Historical Provider, MD    Scheduled Meds: . ARIPiprazole  10 mg Oral Daily  . atorvastatin  10 mg Oral QPM  . furosemide  40 mg Oral QPM  . insulin aspart  0-15 Units Subcutaneous TID WC  . lamoTRIgine  100 mg Oral Daily   And  . lamoTRIgine  150 mg Oral QHS  . nebivolol  10 mg Oral Daily  . pantoprazole (PROTONIX) IV  40 mg Intravenous Q24H  . sertraline  100 mg Oral Daily  . sodium chloride  3 mL Intravenous Q12H   Continuous Infusions:  PRN Meds:.acetaminophen, acetaminophen, albuterol, albuterol, morphine injection, ondansetron (ZOFRAN) IV, ondansetron  Allergies as of 10/31/2012 - Review Complete 10/31/2012  Allergen Reaction Noted  . Codeine  10/26/2010  . Penicillins Hives 10/26/2010  . Sulfa antibiotics Hives 10/26/2010  . Other Rash 05/18/2012    Family History  Problem Relation Age of Onset  . Allergies Father   . Heart disease Father     before 44  . Heart failure Father   . Hypertension Father   . Hyperlipidemia Father   . Heart disease Mother   . Diabetes Mother   . Hypertension Mother   . Hyperlipidemia Mother   . Hypertension Sister   . Heart disease Sister     before 29  . Hyperlipidemia Sister   . Cancer Other     History   Social History  . Marital Status: Divorced    Spouse Name: N/A    Number of Children: N/A  . Years of Education: N/A   Occupational History  . Disabled     Social History Main Topics  . Smoking status: Former Smoker -- 0.50 packs/day for 25 years    Types: Cigarettes    Quit date: 03/21/1982  . Smokeless tobacco: Never Used  . Alcohol Use: No  . Drug Use: No  . Sexually Active: No   Other Topics Concern  . Not on file   Social  History Narrative  . No narrative on file    Review of Systems: All negative except as stated above in HPI.  Physical Exam: Vital signs: Filed Vitals:   11/01/12 0600  BP: 108/73  Pulse: 60  Temp: 98 F (36.7 C)  Resp: 20   Last BM Date: 10/31/12 General:   Alert,  Well-developed, well-nourished, pleasant and cooperative in NAD HEENT: anicteric Lungs:  Clear throughout to auscultation.   No wheezes, crackles, or rhonchi. No acute distress. Heart:  Regular rate and rhythm; no murmurs, clicks, rubs,  or gallops. Abdomen: diffuse lower quadrant tenderness with guarding, soft, nondistended, obese, +BS  Rectal:  Deferred Ext: no edema  GI:  Lab Results:  Recent Labs  10/31/12 1747 10/31/12 2250 11/01/12 0545  WBC 5.9 5.5 4.7  HGB 8.9* 8.4* 8.1*  HCT 27.7* 26.0* 25.8*  PLT 267 239 230   BMET  Recent Labs  10/31/12 1259 11/01/12 0545  NA 135 138  K 3.4* 4.5  CL 100 107  CO2 28 26  GLUCOSE 92 117*  BUN 20 12  CREATININE 0.90 0.88  CALCIUM 8.6 8.2*   LFT  Recent Labs  11/01/12 0545  PROT 5.8*  ALBUMIN 2.7*  AST 15  ALT 11  ALKPHOS 64  BILITOT 0.2*   PT/INR  Recent Labs  10/31/12 1557  LABPROT 14.8  INR 1.18     Studies/Results: Ct Abdomen Pelvis W Contrast  10/31/2012  *RADIOLOGY REPORT*  Clinical Data: Lower abdominal pain, rectal bleeding  CT ABDOMEN AND PELVIS WITH CONTRAST  Technique:  Multidetector CT imaging of the abdomen and pelvis was performed following the standard protocol during bolus administration of intravenous contrast.  Contrast: 50mL OMNIPAQUE IOHEXOL 300 MG/ML  SOLN, OMNIPAQUE IOHEXOL 300 MG/ML  SOLN  Comparison: 10/28/2011  Findings:  Cardiomegaly partly visualized.  Lung bases are clear.  Mild prominence of the common duct is noted with cholecystectomy clips in place, 1.4 cm image 22.  This is increased since previously.  The previously seen pancreatic and peripancreatic fluid collections have resolved.  Pancreatic tail atrophy noted. Liver, adrenal glands, kidneys, and spleen are normal.  Mild atheromatous aortic calcification without aneurysm.  Fat containing umbilical hernia noted.  There is extensive colonic diverticulosis without evidence for wall thickening, pericolonic stranding, or pelvic free fluid.  Uterus is surgically absent.  The ovaries are normal.  Bladder is normal. Fat containing ventral wall hernia superior to the umbilicus incidentally noted, image 40.  Lumbar spine degenerative change noted.  No acute osseous finding.  IMPRESSION: Extensive diverticulosis without CT evidence for diverticulitis.  Interval resolution of pancreatic cystic fluid collections.  Left hepatic common duct dilatation to 1.4 cm left intrahepatic ductal dilatation, increased since previously.  If the patient has symptoms referable to the right upper quadrant, MRCP could be considered for further evaluation if needed, although per the clinical history there is no abnormality in this region at this time.   Original Report Authenticated By: Christiana Pellant, M.D.     Impression/Plan: 67 yo presenting with a GI bleed that appears to be lower in origin. DDx diverticular bleeding vs. Ischemic colitis. Suspect diverticular more so than ischemia. Clears. IVFs. Supportive care. Will hold off on a repeat colonoscopy. Will follow.    LOS: 1 day   Ryane Konieczny C.  11/01/2012, 8:21 AM

## 2012-11-01 NOTE — Progress Notes (Signed)
[facility] PROGRESS NOTE  [name] ZOX:[id number] DOB: 05/07/1946 DOA: 10/31/2012 PCP: Alva Garnet., MD  Assessment/Plan: Lower GI bleed -Presumed diverticular in origin. -She had a similar admission in February 2013 with a colonoscopy showing diverticulosis. -She has not required a transfusion as of yet, however hemoglobin is down to 8.1 from 10.2 on admission. -We'll continue to cycle CBCs every 12 hours and monitor for need for transfusion. -Appreciate GI assistance.  Acute blood loss anemia -Secondary to above.  Type 2 diabetes mellitus -She has had several hypoglycemic events. -Suspect related to her n.p.o. status and should improve as we start feeding her. -We'll continue to follow.  Seizure disorder -No active seizures while in the hospital. -Continue lamotrigine  Code Status: Full code Family Communication: Patient only  Disposition Plan: : Stable   Consultants:  GI, Dr. Bosie Clos   Antibiotics:  None   Subjective: Patient feels improved, with less abdominal pain. Had another bowel movement this morning that still had red blood.  Objective: Filed Vitals:   10/31/12 1510 10/31/12 1649 10/31/12 2200 11/01/12 0600  BP: 120/42 108/56 117/73 108/73  Pulse: 64 65 61 60  Temp: 98.4 F (36.9 C) 97.6 F (36.4 C) 98.2 F (36.8 C) 98 F (36.7 C)  TempSrc: Oral Oral Oral Oral  Resp: 16 18 20 20   Height:  5' (1.524 m)    Weight:  109.5 kg (241 lb 6.5 oz)    SpO2: 94% 95% 96% 96%    Intake/Output Summary (Last 24 hours) at 11/01/12 1025 Last data filed at 11/01/12 0847  Gross per 24 hour  Intake 1803.33 ml  Output      2 ml  Net 1801.33 ml   Filed Weights   10/31/12 1649  Weight: 109.5 kg (241 lb 6.5 oz)    Exam:   General:  Alert, awake, oriented x3  Cardiovascular: Regular rate and rhythm, no murmurs, rubs or gallops  Respiratory: Clear to auscultation bilaterally  Abdomen: Soft, nontender, nondistended, positive bowel  sounds, no masses or organomegaly no  Extremities: No clubbing, cyanosis or edema, positive pedal pulses   Neurologic:  Grossly intact and nonfocal  Data Reviewed: Basic Metabolic Panel:  Recent Labs Lab 10/31/12 1259 11/01/12 0545  NA 135 138  K 3.4* 4.5  CL 100 107  CO2 28 26  GLUCOSE 92 117*  BUN 20 12  CREATININE 0.90 0.88  CALCIUM 8.6 8.2*   Liver Function Tests:  Recent Labs Lab 10/31/12 1259 11/01/12 0545  AST 14 15  ALT 13 11  ALKPHOS 82 64  BILITOT 0.2* 0.2*  PROT 6.8 5.8*  ALBUMIN 3.2* 2.7*    Recent Labs Lab 10/31/12 1259  LIPASE 18   No results found for this basename: AMMONIA,  in the last 168 hours CBC:  Recent Labs Lab 10/31/12 1259 10/31/12 1747 10/31/12 2250 11/01/12 0545  WBC 6.1 5.9 5.5 4.7  NEUTROABS 3.3  --   --   --   HGB 10.2* 8.9* 8.4* 8.1*  HCT 31.6* 27.7* 26.0* 25.8*  MCV 72.6* 72.7* 72.8* 74.1*  PLT 263 267 239 230   Cardiac Enzymes: No results found for this basename: CKTOTAL, CKMB, CKMBINDEX, TROPONINI,  in the last 168 hours BNP (last 3 results) No results found for this basename: PROBNP,  in the last 8760 hours CBG:  Recent Labs Lab 10/31/12 1720 10/31/12 1746 10/31/12 1811 10/31/12 2146 11/01/12 0815  GLUCAP 64* 66* 75 98 120*    No results found for this or  any previous visit (from the past 240 hour(s)).   Studies: Ct Abdomen Pelvis W Contrast  10/31/2012  *RADIOLOGY REPORT*  Clinical Data: Lower abdominal pain, rectal bleeding  CT ABDOMEN AND PELVIS WITH CONTRAST  Technique:  Multidetector CT imaging of the abdomen and pelvis was performed following the standard protocol during bolus administration of intravenous contrast.  Contrast: 50mL OMNIPAQUE IOHEXOL 300 MG/ML  SOLN, OMNIPAQUE IOHEXOL 300 MG/ML  SOLN  Comparison: 10/28/2011  Findings: Cardiomegaly partly visualized.  Lung bases are clear.  Mild prominence of the common duct is noted with cholecystectomy clips in place, 1.4 cm image 22.  This is  increased since previously.  The previously seen pancreatic and peripancreatic fluid collections have resolved.  Pancreatic tail atrophy noted. Liver, adrenal glands, kidneys, and spleen are normal.  Mild atheromatous aortic calcification without aneurysm.  Fat containing umbilical hernia noted.  There is extensive colonic diverticulosis without evidence for wall thickening, pericolonic stranding, or pelvic free fluid.  Uterus is surgically absent.  The ovaries are normal.  Bladder is normal. Fat containing ventral wall hernia superior to the umbilicus incidentally noted, image 40.  Lumbar spine degenerative change noted.  No acute osseous finding.  IMPRESSION: Extensive diverticulosis without CT evidence for diverticulitis.  Interval resolution of pancreatic cystic fluid collections.  Left hepatic common duct dilatation to 1.4 cm left intrahepatic ductal dilatation, increased since previously.  If the patient has symptoms referable to the right upper quadrant, MRCP could be considered for further evaluation if needed, although per the clinical history there is no abnormality in this region at this time.   Original Report Authenticated By: Christiana Pellant, M.D.     Scheduled Meds: . ARIPiprazole  10 mg Oral Daily  . atorvastatin  10 mg Oral QPM  . furosemide  40 mg Oral QPM  . insulin aspart  0-15 Units Subcutaneous TID WC  . lamoTRIgine  100 mg Oral Daily   And  . lamoTRIgine  150 mg Oral QHS  . nebivolol  10 mg Oral Daily  . pantoprazole (PROTONIX) IV  40 mg Intravenous Q24H  . sertraline  100 mg Oral Daily  . sodium chloride  3 mL Intravenous Q12H   Continuous Infusions:   Principal Problem:   Hematochezia Active Problems:   Hypertension   Obesity   Anemia associated with acute blood loss   Lower abdominal pain   DM type 2 (diabetes mellitus, type 2)   Seizure disorder   Diverticulosis    Time spent: 35 minutes    HERNANDEZ [name],[name]  [facility] Pager [phone]  If  7PM-7AM, please contact night-coverage at [email or web address], password [facility] 11/01/2012, 10:25 AM  LOS: 1 day

## 2012-11-02 LAB — CBC
HCT: 26.2 % — ABNORMAL LOW (ref 36.0–46.0)
Hemoglobin: 8.2 g/dL — ABNORMAL LOW (ref 12.0–15.0)
MCH: 23.2 pg — ABNORMAL LOW (ref 26.0–34.0)
MCHC: 31.3 g/dL (ref 30.0–36.0)
MCV: 74 fL — ABNORMAL LOW (ref 78.0–100.0)
Platelets: 253 10*3/uL (ref 150–400)
RBC: 3.54 MIL/uL — ABNORMAL LOW (ref 3.87–5.11)
RDW: 15.1 % (ref 11.5–15.5)
WBC: 5 10*3/uL (ref 4.0–10.5)

## 2012-11-02 LAB — GLUCOSE, CAPILLARY
Glucose-Capillary: 106 mg/dL — ABNORMAL HIGH (ref 70–99)
Glucose-Capillary: 114 mg/dL — ABNORMAL HIGH (ref 70–99)
Glucose-Capillary: 136 mg/dL — ABNORMAL HIGH (ref 70–99)
Glucose-Capillary: 119 mg/dL — ABNORMAL HIGH (ref 70–99)
Glucose-Capillary: 132 mg/dL — ABNORMAL HIGH (ref 70–99)

## 2012-11-02 MED ORDER — METRONIDAZOLE IN NACL 5-0.79 MG/ML-% IV SOLN
500.0000 mg | Freq: Three times a day (TID) | INTRAVENOUS | Status: DC
  Administered 2012-11-02 – 2012-11-05 (×10): 500 mg via INTRAVENOUS
  Filled 2012-11-02 (×11): qty 100

## 2012-11-02 MED ORDER — CIPROFLOXACIN IN D5W 400 MG/200ML IV SOLN
400.0000 mg | Freq: Three times a day (TID) | INTRAVENOUS | Status: DC
  Filled 2012-11-02: qty 200

## 2012-11-02 MED ORDER — CIPROFLOXACIN IN D5W 400 MG/200ML IV SOLN
400.0000 mg | Freq: Two times a day (BID) | INTRAVENOUS | Status: DC
  Administered 2012-11-02 – 2012-11-05 (×7): 400 mg via INTRAVENOUS
  Filled 2012-11-02 (×8): qty 200

## 2012-11-02 NOTE — Progress Notes (Signed)
[facility] PROGRESS NOTE  [name] ZOX:[id number] DOB: [birth year] DOA: 10/31/2012 PCP: Alva Garnet., MD  Assessment/Plan: Lower GI bleed -Presumed diverticular in origin. -She had a similar admission in February 2013 with a colonoscopy showing diverticulosis. -She has not required a transfusion as of yet. -Hemoglobin has been stable in the low 8 range. -Transfuse if hemoglobin below 7. -Will advance diet today and if hemoglobin remains stable potential discharge home in the morning. -Appreciate GI assistance.  Acute blood loss anemia -Secondary to above.  Type 2 diabetes mellitus -Hypoglycemia has resolved now that she is eating. -Suspect related to her n.p.o. status and should improve as we start feeding her. -We'll continue to follow.  Seizure disorder -No active seizures while in the hospital. -Continue lamotrigine  Code Status: Full code Family Communication: Patient only  Disposition Plan: : Stable   Consultants:  GI, Dr. Bosie Clos   Antibiotics:  None   Subjective: Mild abdominal pain.  Objective: Filed Vitals:   11/01/12 0600 11/01/12 1402 11/01/12 2158 11/02/12 0618  BP: 108/73 108/65 122/73 126/74  Pulse: 60 65 63 66  Temp: 98 F (36.7 C) 98.8 F (37.1 C) 98.6 F (37 C) 98.6 F (37 C)  TempSrc: Oral Oral Oral Oral  Resp: 20 18 18 16   Height:      Weight:      SpO2: 96% 95% 94% 92%    Intake/Output Summary (Last 24 hours) at 11/02/12 1028 Last data filed at 11/02/12 0819  Gross per 24 hour  Intake    720 ml  Output      0 ml  Net    720 ml   Filed Weights   10/31/12 1649  Weight: 109.5 kg (241 lb 6.5 oz)    Exam:   General:  Alert, awake, oriented x3  Cardiovascular: Regular rate and rhythm, no murmurs, rubs or gallops  Respiratory: Clear to auscultation bilaterally  Abdomen: Soft, nontender, nondistended, positive bowel sounds, no masses or organomegaly no  Extremities: No clubbing, cyanosis or edema,  positive pedal pulses   Neurologic:  Grossly intact and nonfocal  Data Reviewed: Basic Metabolic Panel:  Recent Labs Lab 10/31/12 1259 11/01/12 0545  NA 135 138  K 3.4* 4.5  CL 100 107  CO2 28 26  GLUCOSE 92 117*  BUN 20 12  CREATININE 0.90 0.88  CALCIUM 8.6 8.2*   Liver Function Tests:  Recent Labs Lab 10/31/12 1259 11/01/12 0545  AST 14 15  ALT 13 11  ALKPHOS 82 64  BILITOT 0.2* 0.2*  PROT 6.8 5.8*  ALBUMIN 3.2* 2.7*    Recent Labs Lab 10/31/12 1259  LIPASE 18   No results found for this basename: AMMONIA,  in the last 168 hours CBC:  Recent Labs Lab 10/31/12 1259 10/31/12 1747 10/31/12 2250 11/01/12 0545 11/01/12 1642 11/02/12 0512  WBC 6.1 5.9 5.5 4.7 5.6 5.0  NEUTROABS 3.3  --   --   --   --   --   HGB 10.2* 8.9* 8.4* 8.1* 7.7* 8.2*  HCT 31.6* 27.7* 26.0* 25.8* 25.6* 26.2*  MCV 72.6* 72.7* 72.8* 74.1* 73.8* 74.0*  PLT 263 267 239 230 224 253   Cardiac Enzymes: No results found for this basename: CKTOTAL, CKMB, CKMBINDEX, TROPONINI,  in the last 168 hours BNP (last 3 results) No results found for this basename: PROBNP,  in the last 8760 hours CBG:  Recent Labs Lab 11/01/12 0815 11/01/12 1127 11/01/12 1627 11/01/12 2145 11/02/12 0722  GLUCAP 120* 142* 78  112* 106*    No results found for this or any previous visit (from the past 240 hour(s)).   Studies: Ct Abdomen Pelvis W Contrast  10/31/2012  *RADIOLOGY REPORT*  Clinical Data: Lower abdominal pain, rectal bleeding  CT ABDOMEN AND PELVIS WITH CONTRAST  Technique:  Multidetector CT imaging of the abdomen and pelvis was performed following the standard protocol during bolus administration of intravenous contrast.  Contrast: 50mL OMNIPAQUE IOHEXOL 300 MG/ML  SOLN, OMNIPAQUE IOHEXOL 300 MG/ML  SOLN  Comparison: 10/28/2011  Findings: Cardiomegaly partly visualized.  Lung bases are clear.  Mild prominence of the common duct is noted with cholecystectomy clips in place, 1.4 cm image 22.   This is increased since previously.  The previously seen pancreatic and peripancreatic fluid collections have resolved.  Pancreatic tail atrophy noted. Liver, adrenal glands, kidneys, and spleen are normal.  Mild atheromatous aortic calcification without aneurysm.  Fat containing umbilical hernia noted.  There is extensive colonic diverticulosis without evidence for wall thickening, pericolonic stranding, or pelvic free fluid.  Uterus is surgically absent.  The ovaries are normal.  Bladder is normal. Fat containing ventral wall hernia superior to the umbilicus incidentally noted, image 40.  Lumbar spine degenerative change noted.  No acute osseous finding.  IMPRESSION: Extensive diverticulosis without CT evidence for diverticulitis.  Interval resolution of pancreatic cystic fluid collections.  Left hepatic common duct dilatation to 1.4 cm left intrahepatic ductal dilatation, increased since previously.  If the patient has symptoms referable to the right upper quadrant, MRCP could be considered for further evaluation if needed, although per the clinical history there is no abnormality in this region at this time.   Original Report Authenticated By: Christiana Pellant, M.D.     Scheduled Meds: . ARIPiprazole  10 mg Oral Daily  . atorvastatin  10 mg Oral QPM  . furosemide  40 mg Oral QPM  . insulin aspart  0-15 Units Subcutaneous TID WC  . lamoTRIgine  100 mg Oral Daily   And  . lamoTRIgine  150 mg Oral QHS  . nebivolol  10 mg Oral Daily  . pantoprazole (PROTONIX) IV  40 mg Intravenous Q24H  . sertraline  100 mg Oral Daily  . sodium chloride  3 mL Intravenous Q12H   Continuous Infusions:   Principal Problem:   Hematochezia Active Problems:   Hypertension   Obesity   Anemia associated with acute blood loss   Lower abdominal pain   DM type 2 (diabetes mellitus, type 2)   Seizure disorder   Diverticulosis    Time spent: 35 minutes    HERNANDEZ [name],[name]  [facility] Pager  [phone]  If 7PM-7AM, please contact night-coverage at [email or web address], password [facility] 11/02/2012, 10:28 AM  LOS: 2 days

## 2012-11-02 NOTE — Progress Notes (Signed)
Pt transferred to 4th by Erick Blinks. Vwilliams,rn.

## 2012-11-02 NOTE — Progress Notes (Addendum)
Patient ID: [name], female   DOB: [birth year], 67 y.o.   MRN: [id number] [facility] Gastroenterology Progress Note  [name] 67 y.o. 1946/02/03   Subjective: Reports recurrence of lower abdominal pain last night and ongoing rectal bleeding. Reports amount of bleeding similar to amount she saw at home. Felt nauseous this morning.  Objective: Vital signs in last 24 hours: Filed Vitals:   11/02/12 0618  BP: 126/74  Pulse: 66  Temp: 98.6 F (37 C)  Resp: 16    Physical Exam: Gen: sleeping but easily arousable, no acute distress Abd: LLQ tenderness with minimal guarding, soft, nondistended, +BS  Lab Results:  Recent Labs  10/31/12 1259 11/01/12 0545  NA 135 138  K 3.4* 4.5  CL 100 107  CO2 28 26  GLUCOSE 92 117*  BUN 20 12  CREATININE 0.90 0.88  CALCIUM 8.6 8.2*    Recent Labs  10/31/12 1259 11/01/12 0545  AST 14 15  ALT 13 11  ALKPHOS 82 64  BILITOT 0.2* 0.2*  PROT 6.8 5.8*  ALBUMIN 3.2* 2.7*    Recent Labs  10/31/12 1259  11/01/12 1642 11/02/12 0512  WBC 6.1  < > 5.6 5.0  NEUTROABS 3.3  --   --   --   HGB 10.2*  < > 7.7* 8.2*  HCT 31.6*  < > 25.6* 26.2*  MCV 72.6*  < > 73.8* 74.0*  PLT 263  < > 224 253  < > = values in this interval not displayed.  Recent Labs  10/31/12 1557  LABPROT 14.8  INR 1.18      Assessment/Plan: 67 yo with rectal bleeding and ongoing abdominal pain, which is concerning for ischemic colitis more so than diverticular due to the continued pain. However, blood can cause a cathartic effect and pain too. Will start Cipro/Flagyl. Change to clear liquids only as tolerated. IVFs. Supportive care.   Gearlene [name] C. 11/02/2012, 10:26 AM

## 2012-11-02 NOTE — Progress Notes (Signed)
Report called and given to [place], rn on 4th floor. All questions answered. Vwilliams,rn.

## 2012-11-03 LAB — BASIC METABOLIC PANEL
BUN: 5 mg/dL — ABNORMAL LOW (ref 6–23)
CO2: 30 mEq/L (ref 19–32)
Calcium: 8.5 mg/dL (ref 8.4–10.5)
Chloride: 104 mEq/L (ref 96–112)
Creatinine, Ser: 0.92 mg/dL (ref 0.50–1.10)
GFR calc Af Amer: 75 mL/min — ABNORMAL LOW (ref >90)
GFR calc non Af Amer: 64 mL/min — ABNORMAL LOW (ref >90)
Glucose, Bld: 144 mg/dL — ABNORMAL HIGH (ref 70–99)
Potassium: 3.5 mEq/L (ref 3.5–5.1)
Sodium: 138 mEq/L (ref 135–145)

## 2012-11-03 LAB — CBC
HCT: 25 % — ABNORMAL LOW (ref 36.0–46.0)
Hemoglobin: 7.6 g/dL — ABNORMAL LOW (ref 12.0–15.0)
MCH: 22.4 pg — ABNORMAL LOW (ref 26.0–34.0)
MCHC: 30.4 g/dL (ref 30.0–36.0)
MCV: 73.5 fL — ABNORMAL LOW (ref 78.0–100.0)
Platelets: 224 10*3/uL (ref 150–400)
RBC: 3.4 MIL/uL — ABNORMAL LOW (ref 3.87–5.11)
RDW: 15 % (ref 11.5–15.5)
WBC: 4.3 10*3/uL (ref 4.0–10.5)

## 2012-11-03 LAB — GLUCOSE, CAPILLARY
Glucose-Capillary: 118 mg/dL — ABNORMAL HIGH (ref 70–99)
Glucose-Capillary: 148 mg/dL — ABNORMAL HIGH (ref 70–99)
Glucose-Capillary: 112 mg/dL — ABNORMAL HIGH (ref 70–99)
Glucose-Capillary: 160 mg/dL — ABNORMAL HIGH (ref 70–99)

## 2012-11-03 NOTE — Progress Notes (Signed)
[facility] PROGRESS NOTE  [name] NWG:[id number] DOB: Feb 26, 1946 DOA: 10/31/2012 PCP: Alva Garnet., MD  Assessment/Plan: Lower GI bleed -Presumed diverticular versus possibly ischemic colitis. -Was started on Cipro/Flagyl by GI yesterday -She had a similar admission in February 2013 with a colonoscopy showing diverticulosis. -She has not required a transfusion as of yet. -Hemoglobin is slightly lower today at 7.6. -Transfuse if hemoglobin below 7. -Will advance diet today and if hemoglobin remains stable potential discharge home in the morning. -Appreciate GI assistance.  Acute blood loss anemia -Secondary to above.  Type 2 diabetes mellitus -Hypoglycemia has resolved now that she is eating. -Suspect related to her n.p.o. status and should improve as we start feeding her. -We'll continue to follow.  Seizure disorder -No active seizures while in the hospital. -Continue lamotrigine  Code Status: Full code Family Communication: Patient only  Disposition Plan: : Stable   Consultants:  GI, Dr. Bosie Clos   Antibiotics:  None   Subjective: Mild abdominal pain.  Objective: Filed Vitals:   11/02/12 1404 11/02/12 1651 11/02/12 2200 11/03/12 0500  BP: 121/78 120/54 129/45 124/56  Pulse: 76 60 60 62  Temp: 98.4 F (36.9 C) 98.2 F (36.8 C) 97.6 F (36.4 C) 97.8 F (36.6 C)  TempSrc: Oral Oral Oral Oral  Resp: 18 18 20 18   Height:      Weight:      SpO2: 99% 98% 100% 100%    Intake/Output Summary (Last 24 hours) at 11/03/12 1143 Last data filed at 11/03/12 1115  Gross per 24 hour  Intake   1940 ml  Output   1000 ml  Net    940 ml   Filed Weights   10/31/12 1649  Weight: 109.5 kg (241 lb 6.5 oz)    Exam:   General:  Alert, awake, oriented x3  Cardiovascular: Regular rate and rhythm, no murmurs, rubs or gallops  Respiratory: Clear to auscultation bilaterally  Abdomen: Soft, nontender, nondistended, positive bowel sounds, no  masses or organomegaly no  Extremities: No clubbing, cyanosis or edema, positive pedal pulses   Neurologic:  Grossly intact and nonfocal  Data Reviewed: Basic Metabolic Panel:  Recent Labs Lab 10/31/12 1259 11/01/12 0545 11/03/12 0421  NA 135 138 138  K 3.4* 4.5 3.5  CL 100 107 104  CO2 28 26 30   GLUCOSE 92 117* 144*  BUN 20 12 5*  CREATININE 0.90 0.88 0.92  CALCIUM 8.6 8.2* 8.5   Liver Function Tests:  Recent Labs Lab 10/31/12 1259 11/01/12 0545  AST 14 15  ALT 13 11  ALKPHOS 82 64  BILITOT 0.2* 0.2*  PROT 6.8 5.8*  ALBUMIN 3.2* 2.7*    Recent Labs Lab 10/31/12 1259  LIPASE 18   No results found for this basename: AMMONIA,  in the last 168 hours CBC:  Recent Labs Lab 10/31/12 1259  10/31/12 2250 11/01/12 0545 11/01/12 1642 11/02/12 0512 11/03/12 0421  WBC 6.1  < > 5.5 4.7 5.6 5.0 4.3  NEUTROABS 3.3  --   --   --   --   --   --   HGB 10.2*  < > 8.4* 8.1* 7.7* 8.2* 7.6*  HCT 31.6*  < > 26.0* 25.8* 25.6* 26.2* 25.0*  MCV 72.6*  < > 72.8* 74.1* 73.8* 74.0* 73.5*  PLT 263  < > 239 230 224 253 224  < > = values in this interval not displayed. Cardiac Enzymes: No results found for this basename: CKTOTAL, CKMB, CKMBINDEX, TROPONINI,  in the  last 168 hours BNP (last 3 results) No results found for this basename: PROBNP,  in the last 8760 hours CBG:  Recent Labs Lab 11/02/12 1204 11/02/12 1624 11/02/12 1701 11/02/12 2251 11/03/12 0738  GLUCAP 114* 136* 119* 132* 118*    No results found for this or any previous visit (from the past 240 hour(s)).   Studies: No results found.  Scheduled Meds: . ARIPiprazole  10 mg Oral Daily  . atorvastatin  10 mg Oral QPM  . ciprofloxacin  400 mg Intravenous Q12H  . furosemide  40 mg Oral QPM  . insulin aspart  0-15 Units Subcutaneous TID WC  . lamoTRIgine  100 mg Oral Daily   And  . lamoTRIgine  150 mg Oral QHS  . metronidazole  500 mg Intravenous Q8H  . nebivolol  10 mg Oral Daily  . pantoprazole  (PROTONIX) IV  40 mg Intravenous Q24H  . sertraline  100 mg Oral Daily  . sodium chloride  3 mL Intravenous Q12H   Continuous Infusions:   Principal Problem:   Hematochezia Active Problems:   Hypertension   Obesity   Anemia associated with acute blood loss   Lower abdominal pain   DM type 2 (diabetes mellitus, type 2)   Seizure disorder   Diverticulosis    Time spent: 35 minutes    HERNANDEZ [name],[name]  [facility] Pager [phone]  If 7PM-7AM, please contact night-coverage at [email or web address], password [facility] 11/03/2012, 11:43 AM  LOS: 3 days

## 2012-11-03 NOTE — Progress Notes (Signed)
Patient ID: [name], female   DOB: [birth year], 67 y.o.   MRN: [id number] [facility] Gastroenterology Progress Note  [name] 67 y.o. 05-14-1946   Subjective: Reports decreased rectal bleeding and a little more stool now. Abdominal pain has decreased.  Objective: Vital signs: Filed Vitals:   11/03/12 0500  BP: 124/56  Pulse: 62  Temp: 97.8 F (36.6 C)  Resp: 18    Physical Exam: Gen: alert, no acute distress  Abd: less tender, soft, ND, +BS  Lab Results:  Recent Labs  11/01/12 0545 11/03/12 0421  NA 138 138  K 4.5 3.5  CL 107 104  CO2 26 30  GLUCOSE 117* 144*  BUN 12 5*  CREATININE 0.88 0.92  CALCIUM 8.2* 8.5    Recent Labs  10/31/12 1259 11/01/12 0545  AST 14 15  ALT 13 11  ALKPHOS 82 64  BILITOT 0.2* 0.2*  PROT 6.8 5.8*  ALBUMIN 3.2* 2.7*    Recent Labs  10/31/12 1259  11/02/12 0512 11/03/12 0421  WBC 6.1  < > 5.0 4.3  NEUTROABS 3.3  --   --   --   HGB 10.2*  < > 8.2* 7.6*  HCT 31.6*  < > 26.2* 25.0*  MCV 72.6*  < > 74.0* 73.5*  PLT 263  < > 253 224  < > = values in this interval not displayed.    Assessment/Plan: 67 yo with improvement in rectal bleeding and abdominal pain thought to be due to ischemic colitis. She has made progress since yesterday. Continue antibiotics. She would like soup to eat. Will advance to full liquids and if tolerates can advance further.   Logyn Kendrick C. 11/03/2012, 9:59 AM

## 2012-11-04 LAB — CBC
HCT: 24.2 % — ABNORMAL LOW (ref 36.0–46.0)
Hemoglobin: 7.7 g/dL — ABNORMAL LOW (ref 12.0–15.0)
MCH: 23.4 pg — ABNORMAL LOW (ref 26.0–34.0)
MCHC: 31.8 g/dL (ref 30.0–36.0)
MCV: 73.6 fL — ABNORMAL LOW (ref 78.0–100.0)
Platelets: 254 10*3/uL (ref 150–400)
RBC: 3.29 MIL/uL — ABNORMAL LOW (ref 3.87–5.11)
RDW: 15 % (ref 11.5–15.5)
WBC: 5.5 10*3/uL (ref 4.0–10.5)

## 2012-11-04 LAB — GLUCOSE, CAPILLARY
Glucose-Capillary: 143 mg/dL — ABNORMAL HIGH (ref 70–99)
Glucose-Capillary: 175 mg/dL — ABNORMAL HIGH (ref 70–99)
Glucose-Capillary: 148 mg/dL — ABNORMAL HIGH (ref 70–99)
Glucose-Capillary: 255 mg/dL — ABNORMAL HIGH (ref 70–99)

## 2012-11-04 LAB — BASIC METABOLIC PANEL WITH GFR
BUN: 5 mg/dL — ABNORMAL LOW (ref 6–23)
CO2: 30 meq/L (ref 19–32)
Calcium: 8.6 mg/dL (ref 8.4–10.5)
Chloride: 103 meq/L (ref 96–112)
Creatinine, Ser: 0.99 mg/dL (ref 0.50–1.10)
GFR calc Af Amer: 68 mL/min — ABNORMAL LOW
GFR calc non Af Amer: 59 mL/min — ABNORMAL LOW
Glucose, Bld: 170 mg/dL — ABNORMAL HIGH (ref 70–99)
Potassium: 3.4 meq/L — ABNORMAL LOW (ref 3.5–5.1)
Sodium: 138 meq/L (ref 135–145)

## 2012-11-04 LAB — PREPARE RBC (CROSSMATCH)

## 2012-11-04 NOTE — Progress Notes (Signed)
[facility] PROGRESS NOTE  [name]:[id number] DOB: 1945-10-13 DOA: 10/31/2012 PCP: Alva Garnet., MD  Assessment/Plan: Lower GI bleed -Presumed diverticular versus possibly ischemic colitis. -Was started on Cipro/Flagyl by GI. -She had a similar admission in February 2013 with a colonoscopy showing diverticulosis. -Will transfuse 2 units of PRBCs today to provide a buffer against potential future bleeding.  Acute blood loss anemia -Secondary to above. -Transfusing today.  Type 2 diabetes mellitus -Hypoglycemia has resolved now that she is eating. -Suspect related to her n.p.o. status and should improve as we start feeding her. -We'll continue to follow.  Seizure disorder -No active seizures while in the hospital. -Continue lamotrigine  Code Status: Full code Family Communication: Patient only  Disposition Plan: : Home likely in a.m. after blood transfusion.   Consultants:  GI, Dr. Bosie Clos   Antibiotics:  None   Subjective: Mild abdominal pain.  Objective: Filed Vitals:   11/03/12 1300 11/03/12 2013 11/04/12 0254 11/04/12 0548  BP: 118/48 118/46 120/54 108/45  Pulse: 67 55 60 66  Temp: 98.3 F (36.8 C) 97.7 F (36.5 C) 97.8 F (36.6 C) 98.6 F (37 C)  TempSrc: Oral Oral Oral Oral  Resp: 20 24 19 20   Height:      Weight:      SpO2: 97% 99% 99% 97%    Intake/Output Summary (Last 24 hours) at 11/04/12 1352 Last data filed at 11/04/12 0859  Gross per 24 hour  Intake    883 ml  Output   2775 ml  Net  -1892 ml   Filed Weights   10/31/12 1649  Weight: 109.5 kg (241 lb 6.5 oz)    Exam:   General:  Alert, awake, oriented x3  Cardiovascular: Regular rate and rhythm, no murmurs, rubs or gallops  Respiratory: Clear to auscultation bilaterally  Abdomen: Soft, nontender, nondistended, positive bowel sounds, no masses or organomegaly no  Extremities: No clubbing, cyanosis or edema, positive pedal pulses   Neurologic:  Grossly  intact and nonfocal  Data Reviewed: Basic Metabolic Panel:  Recent Labs Lab 10/31/12 1259 11/01/12 0545 11/03/12 0421 11/04/12 0505  NA 135 138 138 138  K 3.4* 4.5 3.5 3.4*  CL 100 107 104 103  CO2 28 26 30 30   GLUCOSE 92 117* 144* 170*  BUN 20 12 5* 5*  CREATININE 0.90 0.88 0.92 0.99  CALCIUM 8.6 8.2* 8.5 8.6   Liver Function Tests:  Recent Labs Lab 10/31/12 1259 11/01/12 0545  AST 14 15  ALT 13 11  ALKPHOS 82 64  BILITOT 0.2* 0.2*  PROT 6.8 5.8*  ALBUMIN 3.2* 2.7*    Recent Labs Lab 10/31/12 1259  LIPASE 18   No results found for this basename: AMMONIA,  in the last 168 hours CBC:  Recent Labs Lab 10/31/12 1259  11/01/12 0545 11/01/12 1642 11/02/12 0512 11/03/12 0421 11/04/12 0505  WBC 6.1  < > 4.7 5.6 5.0 4.3 5.5  NEUTROABS 3.3  --   --   --   --   --   --   HGB 10.2*  < > 8.1* 7.7* 8.2* 7.6* 7.7*  HCT 31.6*  < > 25.8* 25.6* 26.2* 25.0* 24.2*  MCV 72.6*  < > 74.1* 73.8* 74.0* 73.5* 73.6*  PLT 263  < > 230 224 253 224 254  < > = values in this interval not displayed. Cardiac Enzymes: No results found for this basename: CKTOTAL, CKMB, CKMBINDEX, TROPONINI,  in the last 168 hours BNP (last 3 results) No  results found for this basename: PROBNP,  in the last 8760 hours CBG:  Recent Labs Lab 11/03/12 0738 11/03/12 1151 11/03/12 1653 11/03/12 2017 11/04/12 0747  GLUCAP 118* 148* 112* 160* 143*    No results found for this or any previous visit (from the past 240 hour(s)).   Studies: No results found.  Scheduled Meds: . ARIPiprazole  10 mg Oral Daily  . atorvastatin  10 mg Oral QPM  . ciprofloxacin  400 mg Intravenous Q12H  . furosemide  40 mg Oral QPM  . insulin aspart  0-15 Units Subcutaneous TID WC  . lamoTRIgine  100 mg Oral Daily   And  . lamoTRIgine  150 mg Oral QHS  . metronidazole  500 mg Intravenous Q8H  . nebivolol  10 mg Oral Daily  . pantoprazole (PROTONIX) IV  40 mg Intravenous Q24H  . sertraline  100 mg Oral Daily  .  sodium chloride  3 mL Intravenous Q12H   Continuous Infusions:   Principal Problem:   Hematochezia Active Problems:   Hypertension   Obesity   Anemia associated with acute blood loss   Lower abdominal pain   DM type 2 (diabetes mellitus, type 2)   Seizure disorder   Diverticulosis    Time spent: 35 minutes    [name],[name]  [facility] Pager [phone]  If 7PM-7AM, please contact night-coverage at [email or web address], password [facility] 11/04/2012, 1:52 PM  LOS: 4 days

## 2012-11-04 NOTE — Progress Notes (Signed)
Patient ID: [name], female   DOB: 11-10-1945, 67 y.o.   MRN: [id number] [facility] Gastroenterology Progress Note  [name] 67 y.o. 1945/09/22   Subjective: No further bleeding. Abd pain improved. Urge to defecate after eating and had 2 loose stools after each meal today (states normally at home she has a loose stool with urge postprandially). Sitting in chair. Tolerating solid food.  Objective: Vital signs: Filed Vitals:   11/04/12 0548  BP: 108/45  Pulse: 66  Temp: 98.6 F (37 C)  Resp: 20    Physical Exam: Gen: alert, no acute distress  Abd: less tender, nondistended, soft  Lab Results:  Recent Labs  11/03/12 0421 11/04/12 0505  NA 138 138  K 3.5 3.4*  CL 104 103  CO2 30 30  GLUCOSE 144* 170*  BUN 5* 5*  CREATININE 0.92 0.99  CALCIUM 8.5 8.6   No results found for this basename: AST, ALT, ALKPHOS, BILITOT, PROT, ALBUMIN,  in the last 72 hours  Recent Labs  11/03/12 0421 11/04/12 0505  WBC 4.3 5.5  HGB 7.6* 7.7*  HCT 25.0* 24.2*  MCV 73.5* 73.6*  PLT 224 254      Assessment/Plan: Resolving ischemic colitis - continue abx and change to oral tomorrow to complete 14 day course, agree with transfusion, close to baseline stool habits, home tomorrow if stable   Laticha Ferrucci C. 11/04/2012, 12:35 PM

## 2012-11-05 LAB — TYPE AND SCREEN
ABO/RH(D): B POS
Antibody Screen: NEGATIVE
Unit division: 0
Unit division: 0

## 2012-11-05 LAB — CBC
HCT: 29.8 % — ABNORMAL LOW (ref 36.0–46.0)
Hemoglobin: 9.9 g/dL — ABNORMAL LOW (ref 12.0–15.0)
MCH: 25 pg — ABNORMAL LOW (ref 26.0–34.0)
MCHC: 33.2 g/dL (ref 30.0–36.0)
MCV: 75.3 fL — ABNORMAL LOW (ref 78.0–100.0)
Platelets: 251 10*3/uL (ref 150–400)
RBC: 3.96 MIL/uL (ref 3.87–5.11)
RDW: 16.1 % — ABNORMAL HIGH (ref 11.5–15.5)
WBC: 6.4 10*3/uL (ref 4.0–10.5)

## 2012-11-05 LAB — BASIC METABOLIC PANEL
BUN: 7 mg/dL (ref 6–23)
CO2: 27 mEq/L (ref 19–32)
Calcium: 8.8 mg/dL (ref 8.4–10.5)
Chloride: 101 mEq/L (ref 96–112)
Creatinine, Ser: 0.89 mg/dL (ref 0.50–1.10)
GFR calc Af Amer: 78 mL/min — ABNORMAL LOW (ref >90)
GFR calc non Af Amer: 67 mL/min — ABNORMAL LOW (ref >90)
Glucose, Bld: 233 mg/dL — ABNORMAL HIGH (ref 70–99)
Potassium: 3.6 mEq/L (ref 3.5–5.1)
Sodium: 135 mEq/L (ref 135–145)

## 2012-11-05 LAB — GLUCOSE, CAPILLARY
Glucose-Capillary: 194 mg/dL — ABNORMAL HIGH (ref 70–99)
Glucose-Capillary: 161 mg/dL — ABNORMAL HIGH (ref 70–99)

## 2012-11-05 MED ORDER — ASPIRIN EC 81 MG PO TBEC: 81.0000 mg | DELAYED_RELEASE_TABLET | Freq: Every day | ORAL | Status: DC

## 2012-11-05 MED ORDER — CIPROFLOXACIN HCL 500 MG PO TABS: 500.0000 mg | ORAL_TABLET | Freq: Two times a day (BID) | ORAL | Status: DC

## 2012-11-05 MED ORDER — METRONIDAZOLE 500 MG PO TABS: 500.0000 mg | ORAL_TABLET | Freq: Three times a day (TID) | ORAL | Status: DC

## 2012-11-05 NOTE — Progress Notes (Signed)
Patient ID: [name], female   DOB: 09-Oct-1945, 67 y.o.   MRN: [id number] [facility] Gastroenterology Progress Note  ALICEA WENTE 67 y.o. [birth year]   Subjective: Feels better. No further bleeding. BMs at baseline of a loose stool after eating. Abd pain better.  Objective: Vital signs: Filed Vitals:   11/05/12 0552  BP: 152/69  Pulse: 64  Temp: 98.2 F (36.8 C)  Resp: 18    Physical Exam: Gen: alert, no acute distress  Abd: minimal tenderness in LLQ with voluntary guarding, soft, nondistended, +BS  Lab Results:  Recent Labs  11/04/12 0505 11/05/12 0315  NA 138 135  K 3.4* 3.6  CL 103 101  CO2 30 27  GLUCOSE 170* 233*  BUN 5* 7  CREATININE 0.99 0.89  CALCIUM 8.6 8.8   No results found for this basename: AST, ALT, ALKPHOS, BILITOT, PROT, ALBUMIN,  in the last 72 hours  Recent Labs  11/04/12 0505 11/05/12 0315  WBC 5.5 6.4  HGB 7.7* 9.9*  HCT 24.2* 29.8*  MCV 73.6* 75.3*  PLT 254 251      Assessment/Plan: 67 yo with resolving ischemic colitis. PO Abx to complete 14 day course, ok to go home today from GI standpoint. Needs to f/u with Dr. Dulce Sellar in 3-4 weeks.    Alvia Jablonski C. 11/05/2012, 11:35 AM

## 2012-11-05 NOTE — Progress Notes (Signed)
Inpatient Diabetes Program Recommendations  AACE/ADA: New Consensus Statement on Inpatient Glycemic Control (2013)  Target Ranges:  Prepandial:   less than 140 mg/dL      Peak postprandial:   less than 180 mg/dL (1-2 hours)      Critically ill patients:  140 - 180 mg/dL   Reason for Visit: Hyperglycemia  Results for [name], [name] (MRN [id number]) as of 11/05/2012 10:01  Ref. Range 11/03/2012 20:17 11/04/2012 07:47 11/04/2012 11:53 11/04/2012 16:44 11/04/2012 23:02 11/05/2012 07:31  Glucose-Capillary Latest Range: 70-99 mg/dL 409 (H) 811 (H) 914 (H) 148 (H) 255 (H) 194 (H)    Inpatient Diabetes Program Recommendations Insulin - Basal: Lantus 15 units QAM (Home dose is 30 units QAM) Insulin - Meal Coverage: Add Novolog 3 units tidwc for meal coverage insulin if pt eats >50% meal  Note: Pt eating 100%.  Thank you. Ailene Ards, RD, LDN, CDE Inpatient Diabetes Coordinator [phone]

## 2012-11-08 NOTE — Discharge Summary (Addendum)
Physician Discharge Summary  [name] ZOX:[id number] DOB: 05/17/1946 DOA: 10/31/2012  PCP: Alva Garnet., MD  Admit date: 10/31/2012 Discharge date: 11/05/2012  Time spent: 35 minutes  Recommendations for Outpatient Follow-up:  1. Follow up with GI in 3 to 4 weeks.   Discharge Diagnoses:  Principal Problem:   Hematochezia Active Problems:   Hypertension   Obesity   Anemia associated with acute blood loss   Lower abdominal pain   DM type 2 (diabetes mellitus, type 2)   Seizure disorder   Diverticulosis   Discharge Condition: improved.   Diet recommendation: regular  Filed Weights   10/31/12 1649  Weight: 109.5 kg (241 lb 6.5 oz)    History of present illness:  [name] is a 66 y.o. female with a past medical history of diverticular bleed, for which she was hospitalized in February of this year. She has a history of, diabetes, hypertension, hypercholesterolemia, obesity, heart murmur, seizure disorder, who was in her usual state of health till about 3:30 this morning when she woke up and had large amount of rectal bleeding. It was bright red blood. She had this about 6 times at home. She denies getting dizzy or lightheaded. However, started having lower abdominal pain. The pain is 8 or 10 in intensity without any radiation. She denies vomiting blood, however, did have one episode of vomiting. She is nauseous. Has headache. No fever, no chills. She had a colonoscopy during her last hospitalization. It revealed a diverticulosis in the left side. There was poor prep on the right side. A descending colon polyp was seen. No active bleeding was noted. She was admitted to hospitalist service and a GI consult was obtained. No interventions at this time. She was started on IV antibiotics for possible diverticulitis.    Hospital Course:    Lower GI bleed Presumed diverticular in origin she Was started on Cipro/Flagyl by GI.  She had a similar admission in February 2013  with a colonoscopy showing diverticulosis.  Will transfuse 2 units of PRBCs today to provide a buffer against potential future bleeding. Her H&H on discharge is stable at  9.9/29.8. She is being discharged on oral antibiotics to complete the course. She is also recommended to follow up with GI in 4 weeks with Dr Dulce Sellar.  Acute blood loss anemia  -Secondary to above. She received 2 units of prbc transfusions and her H&H is 9.9/29.8 Type 2 diabetes mellitus  -Hypoglycemia has resolved now that she is eating.  -Suspect related to her n.p.o. status and should improve as we start feeding her.  Resume home medications on discharge.  Seizure disorder  -No active seizures while in the hospital.  -Continue lamotrigine  Consultations:  GI consult.   Discharge Exam: Filed Vitals:   11/04/12 2358 11/05/12 0058 11/05/12 0200 11/05/12 0552  BP: 139/54 132/64 135/65 152/69  Pulse: 60 56 57 64  Temp: 98.2 F (36.8 C) 97.9 F (36.6 C) 98.2 F (36.8 C) 98.2 F (36.8 C)  TempSrc: Oral Oral Oral Oral  Resp: 18 16 20 18   Height:      Weight:      SpO2:    97%    General: alert afebrile comfortable Cardiovascular: s1s2 Respiratory: CTAB  Discharge Instructions  Discharge Orders   Future Orders Complete By Expires     Diet - low sodium heart healthy  As directed     Discharge instructions  As directed     Comments:      FOLLOW  UP WITH PCP as recommended Follow up with Dr Dulce Sellar in 2 to 3 weeks.        Medication List    STOP taking these medications       [facility] MAGNESIUM 250 MG Tabs  Generic drug:  Magnesium      TAKE these medications       albuterol 108 (90 BASE) MCG/ACT inhaler  Commonly known as:  PROVENTIL HFA;VENTOLIN HFA  Inhale 2 puffs into the lungs every 6 (six) hours as needed for wheezing.     ARIPiprazole 10 MG tablet  Commonly known as:  ABILIFY  Take 10 mg by mouth daily.     aspirin EC 81 MG tablet  Take 1 tablet (81 mg total) by mouth at bedtime.  Start  taking on:  11/12/2012     atorvastatin 10 MG tablet  Commonly known as:  LIPITOR  Take 10 mg by mouth every evening.     ciprofloxacin 500 MG tablet  Commonly known as:  CIPRO  Take 1 tablet (500 mg total) by mouth 2 (two) times daily.     ferrous gluconate 324 MG tablet  Commonly known as:  FERGON  Take 324 mg by mouth every evening.     furosemide 40 MG tablet  Commonly known as:  LASIX  Take 40 mg by mouth every evening.     insulin glargine 100 UNIT/ML injection  Commonly known as:  LANTUS  Inject 30 Units into the skin daily.     insulin lispro 100 UNIT/ML injection  Commonly known as:  HUMALOG  Inject 6 Units into the skin daily before supper.     isosorbide dinitrate 30 MG tablet  Commonly known as:  ISORDIL  Take 30 mg by mouth every morning.     LAMICTAL 100 MG tablet  Generic drug:  lamoTRIgine  Take 100-150 mg by mouth 2 (two) times daily. 1 tab in am, 1.5 tab in pm     meloxicam 15 MG tablet  Commonly known as:  MOBIC  Take 15 mg by mouth daily.     metroNIDAZOLE 500 MG tablet  Commonly known as:  FLAGYL  Take 1 tablet (500 mg total) by mouth 3 (three) times daily.     nebivolol 10 MG tablet  Commonly known as:  BYSTOLIC  Take 10 mg by mouth daily.     olmesartan 40 MG tablet  Commonly known as:  BENICAR  Take 40 mg by mouth daily.     omega-3 acid ethyl esters 1 G capsule  Commonly known as:  LOVAZA  Take 1 g by mouth daily.     Pancrelipase (Lip-Prot-Amyl) 25000 UNITS Cpep  Take 2 capsules by mouth 3 (three) times daily.     potassium chloride SA 20 MEQ tablet  Commonly known as:  K-DUR,KLOR-CON  Take 20 mEq by mouth daily.     sertraline 100 MG tablet  Commonly known as:  ZOLOFT  Take 100 mg by mouth daily.     traMADol 50 MG tablet  Commonly known as:  ULTRAM  Take 50 mg by mouth every 6 (six) hours as needed for pain.     VICTOZA 18 MG/3ML Soln injection  Generic drug:  Liraglutide  Inject 1.8 mg into the skin daily with supper.  If CBG > 130       Allergies  Allergen Reactions  . Codeine     Headache and "just feel off"  . Penicillins Hives  . Sulfa Antibiotics Hives  .  Other Rash    Bleach       Follow-up Information   Follow up with Alva Garnet., MD. Schedule an appointment as soon as possible for a visit in 1 week.   Contact information:   [address] [place] [address] [phone]       Follow up with Freddy Jaksch, MD. Schedule an appointment as soon as possible for a visit in 3 weeks.   Contact information:   [address] [place] [address] [phone]        The results of significant diagnostics from this hospitalization (including imaging, microbiology, ancillary and laboratory) are listed below for reference.    Significant Diagnostic Studies: Ct Abdomen Pelvis W Contrast  10/31/2012   *RADIOLOGY REPORT*  Clinical Data: Lower abdominal pain, rectal bleeding  CT ABDOMEN AND PELVIS WITH CONTRAST  Technique:  Multidetector CT imaging of the abdomen and pelvis was performed following the standard protocol during bolus administration of intravenous contrast.  Contrast: 50mL OMNIPAQUE IOHEXOL 300 MG/ML  SOLN, OMNIPAQUE IOHEXOL 300 MG/ML  SOLN  Comparison: 10/28/2011  Findings: Cardiomegaly partly visualized.  Lung bases are clear.  Mild prominence of the common duct is noted with cholecystectomy clips in place, 1.4 cm image 22.  This is increased since previously.  The previously seen pancreatic and peripancreatic fluid collections have resolved.  Pancreatic tail atrophy noted. Liver, adrenal glands, kidneys, and spleen are normal.  Mild atheromatous aortic calcification without aneurysm.  Fat containing umbilical hernia noted.  There is extensive colonic diverticulosis without evidence for wall thickening, pericolonic stranding, or pelvic free fluid.  Uterus is surgically absent.  The ovaries are normal.  Bladder is normal. Fat containing ventral  wall hernia superior to the umbilicus incidentally noted, image 40.  Lumbar spine degenerative change noted.  No acute osseous finding.  IMPRESSION: Extensive diverticulosis without CT evidence for diverticulitis.  Interval resolution of pancreatic cystic fluid collections.  Left hepatic common duct dilatation to 1.4 cm left intrahepatic ductal dilatation, increased since previously.  If the patient has symptoms referable to the right upper quadrant, MRCP could be considered for further evaluation if needed, although per the clinical history there is no abnormality in this region at this time.   Original Report Authenticated By: Christiana Pellant, M.D.    Microbiology: No results found for this or any previous visit (from the past 240 hour(s)).   Labs: Basic Metabolic Panel:  Recent Labs Lab 11/03/12 0421 11/04/12 0505 11/05/12 0315  NA 138 138 135  K 3.5 3.4* 3.6  CL 104 103 101  CO2 30 30 27   GLUCOSE 144* 170* 233*  BUN 5* 5* 7  CREATININE 0.92 0.99 0.89  CALCIUM 8.5 8.6 8.8   Liver Function Tests: No results found for this basename: AST, ALT, ALKPHOS, BILITOT, PROT, ALBUMIN,  in the last 168 hours No results found for this basename: LIPASE, AMYLASE,  in the last 168 hours No results found for this basename: AMMONIA,  in the last 168 hours CBC:  Recent Labs Lab 11/01/12 1642 11/02/12 0512 11/03/12 0421 11/04/12 0505 11/05/12 0315  WBC 5.6 5.0 4.3 5.5 6.4  HGB 7.7* 8.2* 7.6* 7.7* 9.9*  HCT 25.6* 26.2* 25.0* 24.2* 29.8*  MCV 73.8* 74.0* 73.5* 73.6* 75.3*  PLT 224 253 224 254 251   Cardiac Enzymes: No results found for this basename: CKTOTAL, CKMB, CKMBINDEX, TROPONINI,  in the last 168 hours BNP: BNP (last 3 results) No results found for this  basename: PROBNP,  in the last 8760 hours CBG:  Recent Labs Lab 11/04/12 1153 11/04/12 1644 11/04/12 2302 11/05/12 0731 11/05/12 1138  GLUCAP 175* 148* 255* 194* 161*       Signed:  Shirlena Brinegar  [facility] 11/05/2012, 3:03 PM

## 2012-12-23 ENCOUNTER — Other Ambulatory Visit: Payer: Self-pay | Admitting: Endocrinology

## 2012-12-29 ENCOUNTER — Other Ambulatory Visit (INDEPENDENT_AMBULATORY_CARE_PROVIDER_SITE_OTHER): Payer: Medicare HMO

## 2012-12-29 DIAGNOSIS — IMO0001 Reserved for inherently not codable concepts without codable children: Secondary | ICD-10-CM

## 2012-12-29 LAB — URINALYSIS
Bilirubin Urine: NEGATIVE
Hgb urine dipstick: NEGATIVE
Ketones, ur: NEGATIVE
Leukocytes, UA: NEGATIVE
Nitrite: NEGATIVE
Specific Gravity, Urine: >=1.03 (ref 1.000–1.030)
Total Protein, Urine: NEGATIVE
Urine Glucose: NEGATIVE
Urobilinogen, UA: 0.2 (ref 0.0–1.0)
pH: 6 (ref 5.0–8.0)

## 2012-12-29 LAB — BASIC METABOLIC PANEL
BUN: 15 mg/dL (ref 6–23)
CO2: 26 mEq/L (ref 19–32)
Calcium: 9.2 mg/dL (ref 8.4–10.5)
Chloride: 103 mEq/L (ref 96–112)
Creatinine, Ser: 0.9 mg/dL (ref 0.4–1.2)
GFR: 78.85 mL/min (ref >60.00)
Glucose, Bld: 81 mg/dL (ref 70–99)
Potassium: 3.9 mEq/L (ref 3.5–5.1)
Sodium: 140 mEq/L (ref 135–145)

## 2012-12-29 LAB — FRUCTOSAMINE: Fructosamine: 255 umol/L (ref <285)

## 2012-12-29 LAB — MICROALBUMIN / CREATININE URINE RATIO
Creatinine,U: 177.8 mg/dL
Microalb Creat Ratio: 0.5 mg/g (ref 0.0–30.0)
Microalb, Ur: 0.9 mg/dL (ref 0.0–1.9)

## 2012-12-31 ENCOUNTER — Encounter: Payer: Self-pay | Admitting: Endocrinology

## 2012-12-31 ENCOUNTER — Ambulatory Visit (INDEPENDENT_AMBULATORY_CARE_PROVIDER_SITE_OTHER): Payer: Medicare HMO | Admitting: Endocrinology

## 2012-12-31 ENCOUNTER — Other Ambulatory Visit: Payer: Self-pay | Admitting: *Deleted

## 2012-12-31 VITALS — BP 126/82 | HR 73 | Ht 61.0 in | Wt 237.1 lb

## 2012-12-31 DIAGNOSIS — E7800 Pure hypercholesterolemia, unspecified: Secondary | ICD-10-CM

## 2012-12-31 DIAGNOSIS — E119 Type 2 diabetes mellitus without complications: Secondary | ICD-10-CM

## 2012-12-31 DIAGNOSIS — I10 Essential (primary) hypertension: Secondary | ICD-10-CM

## 2012-12-31 MED ORDER — GLUCOSE BLOOD VI STRP: ORAL_STRIP | Status: DC

## 2012-12-31 NOTE — Patient Instructions (Addendum)
Reduce Lantus to 26; less SUGARS IN AM AND SUPPER AND MORE 2-3 HRS AFTER MEALS  Try reducing Meloxicam to 7.5 (1/2)

## 2012-12-31 NOTE — Progress Notes (Signed)
Patient ID: SWEETIE GIEBLER, female   DOB: 1946/05/12, 67 y.o.   MRN: [id number] Reason for Appointment: Diabetes follow-up   History of Present Illness   Diagnosis: Type 2 DIABETES MELITUS, date of diagnosis: 2008           Oral hypoglycemic drugs: None        Side effects from medications:  diarrhea from metformin and occasional nausea lasting about 10 minutes probably from Victoza Insulin regimen: Lantus 28 units at night and Humalog 6 units at supper           Proper timing of medications in relation to meals: Yes.         Monitors blood glucose:  twice a day.    Glucometer:  generic          Blood Glucose readings:  home blood sugar records reviewed: readings before breakfast: 64-187, mostly <125. acs 91-187  Hypoglycemia frequency:  feel somewhat hypoglycemic occasionally on waking up with low-normal blood sugars but has only 1 reading below 70 recently         Meals: 3 meals per day.          Physical activity:   exercising at the [facility], upper body, recumbent bike, ankle pain, not walking            Meals: 3 meals per day, eating out more now, variably controlling portions; does not consume any sweet drinks. Oatmeal, cereal at bfst.  Snacks: Afternoon, sometimes having larger portions Dietician visit: Most recent: 6/13.   PAST history: Her blood sugars had initially responded very well to adding Victoza in 2/13. She had lost weight and was able to comply with diet consistently. A1c had gone down to 7.2%. Also was needing less Levemir insulin . However since 9/13 she had gone off her diet and blood sugars were higher also.  RECENT HISTORY: Since her last visit she has really done well with improving her compliance with diet and exercise. Blood sugars are also considerably better including relatively low readings in the mornings. She has been on about the same dosage of LANTUS insulin but will occasionally increase the dose even up the blood sugar in the morning is higher for one or 2 days.  Keeping a record of her insulin and fasting glucose as directed. Also has been taking her Humalog 6 units before supper as instructed   The last HbgA1c was 6.3 in 5/14   Appointment on 12/29/2012  Component Date Value Range Status  . Sodium 12/29/2012 140  135 - 145 mEq/L Final  . Potassium 12/29/2012 3.9  3.5 - 5.1 mEq/L Final  . Chloride 12/29/2012 103  96 - 112 mEq/L Final  . CO2 12/29/2012 26  19 - 32 mEq/L Final  . Glucose, Bld 12/29/2012 81  70 - 99 mg/dL Final  . BUN 62/95/2841 15  6 - 23 mg/dL Final  . Creatinine, Ser 12/29/2012 0.9  0.4 - 1.2 mg/dL Final  . Calcium 32/44/0102 9.2  8.4 - 10.5 mg/dL Final  . GFR 72/53/6644 78.85  >60.00 mL/min Final  . Fructosamine 12/29/2012 255  <285 umol/L Final   Comment:                            Variations in levels of serum proteins (albumin and immunoglobulins)  may affect fructosamine results.                             . Color, Urine 12/29/2012 LT. YELLOW  Yellow;Lt. Yellow Final  . APPearance 12/29/2012 CLEAR  Clear Final  . Specific Gravity, Urine 12/29/2012 >=1.030  1.000 - 1.030 Final  . pH 12/29/2012 6.0  5.0 - 8.0 Final  . Total Protein, Urine 12/29/2012 NEGATIVE  Negative Final  . Urine Glucose 12/29/2012 NEGATIVE  Negative Final  . Ketones, ur 12/29/2012 NEGATIVE  Negative Final  . Bilirubin Urine 12/29/2012 NEGATIVE  Negative Final  . Hgb urine dipstick 12/29/2012 NEGATIVE  Negative Final  . Urobilinogen, UA 12/29/2012 0.2  0.0 - 1.0 Final  . Leukocytes, UA 12/29/2012 NEGATIVE  Negative Final  . Nitrite 12/29/2012 NEGATIVE  Negative Final  . Microalb, Ur 12/29/2012 0.9  0.0 - 1.9 mg/dL Final  . Creatinine,U 82/95/6213 177.8   Final  . Microalb Creat Ratio 12/29/2012 0.5  0.0 - 30.0 mg/g Final      Medication List       This list is accurate as of: 12/31/12 10:31 AM.  Always use your most recent med list.               accu-chek multiclix lancets     ACCU-CHEK SMARTVIEW test strip   Generic drug:  glucose blood     albuterol 108 (90 BASE) MCG/ACT inhaler  Commonly known as:  PROVENTIL HFA;VENTOLIN HFA  Inhale 2 puffs into the lungs every 6 (six) hours as needed for wheezing.     ARIPiprazole 10 MG tablet  Commonly known as:  ABILIFY  Take 10 mg by mouth daily.     aspirin EC 81 MG tablet  Take 1 tablet (81 mg total) by mouth at bedtime.     atorvastatin 10 MG tablet  Commonly known as:  LIPITOR  Take 10 mg by mouth every evening.     B-D SINGLE USE SWABS REGULAR Pads     ciprofloxacin 500 MG tablet  Commonly known as:  CIPRO  Take 1 tablet (500 mg total) by mouth 2 (two) times daily.     ferrous gluconate 324 MG tablet  Commonly known as:  FERGON  Take 324 mg by mouth every evening.     furosemide 40 MG tablet  Commonly known as:  LASIX  Take 40 mg by mouth every evening.     insulin glargine 100 UNIT/ML injection  Commonly known as:  LANTUS  Inject 30 Units into the skin daily.     insulin lispro 100 UNIT/ML injection  Commonly known as:  HUMALOG  Inject 6 Units into the skin daily before supper.     isosorbide dinitrate 30 MG tablet  Commonly known as:  ISORDIL  Take 30 mg by mouth every morning.     isosorbide mononitrate 30 MG 24 hr tablet  Commonly known as:  IMDUR     LAMICTAL 100 MG tablet  Generic drug:  lamoTRIgine  Take 100-150 mg by mouth 2 (two) times daily. 1 tab in am, 1.5 tab in pm     meloxicam 15 MG tablet  Commonly known as:  MOBIC  Take 15 mg by mouth daily.     metroNIDAZOLE 500 MG tablet  Commonly known as:  FLAGYL  Take 1 tablet (500 mg total) by mouth 3 (three) times daily.     nebivolol 10 MG tablet  Commonly known as:  BYSTOLIC  Take 10 mg by mouth daily.     olmesartan 40 MG tablet  Commonly known as:  BENICAR  Take 40 mg by mouth daily.     omega-3 acid ethyl esters 1 G capsule  Commonly known as:  LOVAZA  Take 1 g by mouth daily.     Pancrelipase (Lip-Prot-Amyl) 25000 UNITS Cpep  Take 2 capsules  by mouth 3 (three) times daily.     potassium chloride SA 20 MEQ tablet  Commonly known as:  K-DUR,KLOR-CON  Take 20 mEq by mouth daily.     sertraline 100 MG tablet  Commonly known as:  ZOLOFT  Take 100 mg by mouth daily.     traMADol 50 MG tablet  Commonly known as:  ULTRAM  Take 50 mg by mouth every 6 (six) hours as needed for pain.     VICTOZA 18 MG/3ML Soln injection  Generic drug:  Liraglutide  Inject 1.8 mg into the skin daily with supper. If CBG > 130        Allergies:  Allergies  Allergen Reactions  . Codeine     Headache and "just feel off"  . Penicillins Hives  . Sulfa Antibiotics Hives  . Other Rash    Bleach    Past Medical History  Diagnosis Date  . Myocardial infarction   . Respiratory failure      multifactorial secondary to congestive heart failure, morbid obesity, decreased abdominal  compliance in the setting of pancreatitis.  . Diastolic heart failure   . Leukocytosis     secondary to steroid and pancreatitis.  . DM (diabetes mellitus)   . HTN (hypertension)   . Seizure disorder   . Hyperlipidemia   . Back pain   . Obstructive sleep apnea   . Empty sella     on MRI in 2009.  . Diverticulitis   . Abnormal liver function      in the past.  . Headache(784.0)   . Pneumonia   . Stroke   . Seizures   . Pancreatitis   . Family history of anesthesia complication     History of a seizure  . Anemia   . Depression     Past Surgical History  Procedure Laterality Date  . Cholecystectomy    . Abdominal hysterectomy    . Tubal ligation    . Tonsillectomy    . Appendectomy    . Cardiac catheterization      jan 2012  . Rectal polypectomy    . Eye cysts    . Back surgery    . Colonoscopy N/A 08/15/2012    Procedure: COLONOSCOPY;  Surgeon: Willis Modena, MD;  Location: [facility];  Service: Endoscopy;  Laterality: N/A;    Family History  Problem Relation Age of Onset  . Allergies Father   . Heart disease Father     before 9  .  Heart failure Father   . Hypertension Father   . Hyperlipidemia Father   . Heart disease Mother   . Diabetes Mother   . Hypertension Mother   . Hyperlipidemia Mother   . Hypertension Sister   . Heart disease Sister     before 78  . Hyperlipidemia Sister   . Cancer Other     Social History:  reports that she quit smoking about 30 years ago. Her smoking use included Cigarettes. She has a 12.5 pack-year smoking history. She has never used smokeless tobacco. She reports that she does not drink alcohol or  use illicit drugs.    Examination:   BP 126/82  Pulse 73  Ht 5\' 1"  (1.549 m)  Wt 237 lb 1.6 oz (107.548 kg)  BMI 44.82 kg/m2  SpO2 98%  Body mass index is 44.82 kg/(m^2).   Assesment:   Diabetes type 2, uncontrolled - 250.02  The patient's diabetes control appears to be excellent although only fasting readings are available for review. Her morning readings are probably on the low side and need to review her readings after supper to help adjust the suppertime Humalog, this was discussed. Also discussed keeping the Lantus dose unchanged unless fasting readings are consistently high or low for 3 days in a row. Also given Accu-Chek meter to use instead of generic    PLAN:  See patient instructions  Heriberto Stmartin 12/31/2012, 10:31 AM

## 2013-01-01 ENCOUNTER — Other Ambulatory Visit: Payer: Self-pay | Admitting: *Deleted

## 2013-01-20 ENCOUNTER — Other Ambulatory Visit: Payer: Self-pay | Admitting: Specialist

## 2013-01-20 DIAGNOSIS — M79672 Pain in left foot: Secondary | ICD-10-CM

## 2013-01-23 ENCOUNTER — Ambulatory Visit
Admission: RE | Admit: 2013-01-23 | Discharge: 2013-01-23 | Disposition: A | Discharge status: Home / Self Care | Payer: Medicare HMO | Source: Ambulatory Visit | Attending: Specialist | Admitting: Specialist

## 2013-01-23 DIAGNOSIS — M79672 Pain in left foot: Secondary | ICD-10-CM

## 2013-03-23 ENCOUNTER — Other Ambulatory Visit: Payer: Self-pay | Admitting: *Deleted

## 2013-03-23 MED ORDER — LIRAGLUTIDE 18 MG/3ML ~~LOC~~ SOPN: 1.8000 mg | PEN_INJECTOR | Freq: Every day | SUBCUTANEOUS | Status: DC

## 2013-04-02 ENCOUNTER — Ambulatory Visit: Payer: Medicare HMO | Admitting: Endocrinology

## 2013-04-06 ENCOUNTER — Other Ambulatory Visit: Payer: Self-pay | Admitting: *Deleted

## 2013-04-06 ENCOUNTER — Ambulatory Visit (INDEPENDENT_AMBULATORY_CARE_PROVIDER_SITE_OTHER): Payer: Commercial Managed Care - HMO | Admitting: Endocrinology

## 2013-04-06 ENCOUNTER — Encounter: Payer: Self-pay | Admitting: Endocrinology

## 2013-04-06 VITALS — BP 130/82 | HR 72 | Temp 98.7°F | Resp 12 | Ht 61.0 in | Wt 242.0 lb

## 2013-04-06 DIAGNOSIS — E119 Type 2 diabetes mellitus without complications: Secondary | ICD-10-CM

## 2013-04-06 DIAGNOSIS — E785 Hyperlipidemia, unspecified: Secondary | ICD-10-CM

## 2013-04-06 LAB — COMPREHENSIVE METABOLIC PANEL
ALT: 10 U/L (ref 0–35)
AST: 19 U/L (ref 0–37)
Albumin: 3.8 g/dL (ref 3.5–5.2)
Alkaline Phosphatase: 96 U/L (ref 39–117)
BUN: 13 mg/dL (ref 6–23)
CO2: 29 mEq/L (ref 19–32)
Calcium: 9.1 mg/dL (ref 8.4–10.5)
Chloride: 101 mEq/L (ref 96–112)
Creatinine, Ser: 0.9 mg/dL (ref 0.4–1.2)
GFR: 84.04 mL/min (ref >60.00)
Glucose, Bld: 117 mg/dL — ABNORMAL HIGH (ref 70–99)
Potassium: 3.9 mEq/L (ref 3.5–5.1)
Sodium: 138 mEq/L (ref 135–145)
Total Bilirubin: 0.4 mg/dL (ref 0.3–1.2)
Total Protein: 7.8 g/dL (ref 6.0–8.3)

## 2013-04-06 LAB — HEMOGLOBIN A1C: Hgb A1c MFr Bld: 8.1 % — ABNORMAL HIGH (ref 4.6–6.5)

## 2013-04-06 MED ORDER — ACCU-CHEK MULTICLIX LANCETS MISC: Status: DC

## 2013-04-06 NOTE — Progress Notes (Signed)
Patient ID: [name], female   DOB: 06/10/1946, 67 y.o.   MRN: [id number]  Reason for Appointment: Diabetes follow-up   History of Present Illness   Diagnosis: Type 2 DIABETES MELITUS, date of diagnosis: 2008       PAST history: Her blood sugars had initially responded very well to adding Victoza in 2/13. She had lost weight and was able to comply with diet consistently. A1c had gone down to 7.2%. Also was needing less Levemir insulin . However blood sugars will be higher if she goes off her diet  With good compliance her A1c was near normal at 6.3 in 5/14  RECENT HISTORY: On her last visit she had really done well with improving her compliance with diet and exercise. Blood sugars as judged by her fructosamine and the home readings were also fairly good However she has gained weight and her blood sugars are significantly higher after meals Currently only taking mealtime coverage at suppertime which may not be adequate also Also not exercising as regularly. Did not bring her home glucose monitor for review today She is compliant with her Victoza at night usually       Oral hypoglycemic drugs: None        Side effects from medications:  diarrhea from metformin and occasional transient nausea ? from Victoza Insulin regimen: Lantus 30 units at night and Humalog 6 units after supper           Proper timing of medications in relation to meals: Yes.         Monitors blood glucose:  twice a day.    Glucometer: ? Accu-Chek          Blood Glucose readings by recall: readings before breakfast: 76-110; acl 200; acs 130-170; pc 150-170 (268)  Hypoglycemia frequency:  none Meals: 3 meals per day.        Physical activity:  is doing this on some days            Meals: 3 meals per day, eating out more now, variably controlling portions; does not consume any sweet drinks. Still eating oatmeal, cereal at breakfast on some days  Snacks: At times Dietician visit: Most recent: 6/13.       Medication List       This list is accurate as of: 04/06/13 10:53 AM.  Always use your most recent med list.               accu-chek multiclix lancets     albuterol 108 (90 BASE) MCG/ACT inhaler  Commonly known as:  PROVENTIL HFA;VENTOLIN HFA  Inhale 2 puffs into the lungs every 6 (six) hours as needed for wheezing.     ARIPiprazole 10 MG tablet  Commonly known as:  ABILIFY  Take 10 mg by mouth daily.     aspirin EC 81 MG tablet  Take 1 tablet (81 mg total) by mouth at bedtime.     atorvastatin 10 MG tablet  Commonly known as:  LIPITOR  Take 10 mg by mouth every evening.     B-D SINGLE USE SWABS REGULAR Pads     carbidopa-levodopa 25-100 MG per tablet  Commonly known as:  SINEMET IR     ferrous gluconate 324 MG tablet  Commonly known as:  FERGON  Take 324 mg by mouth every evening.     furosemide 40 MG tablet  Commonly known as:  LASIX  Take 40 mg by mouth every evening.     glucose blood  test strip  Commonly known as:  ACCU-CHEK SMARTVIEW  accu-chek smart view test strips, DAW, dx code 250     HYDROcodone-acetaminophen 5-325 MG per tablet  Commonly known as:  NORCO/VICODIN     insulin glargine 100 UNIT/ML injection  Commonly known as:  LANTUS  Inject 30 Units into the skin daily.     insulin lispro 100 UNIT/ML injection  Commonly known as:  HUMALOG  Inject 6 Units into the skin daily before supper.     isosorbide dinitrate 30 MG tablet  Commonly known as:  ISORDIL  Take 30 mg by mouth every morning.     isosorbide mononitrate 30 MG 24 hr tablet  Commonly known as:  IMDUR     LAMICTAL 100 MG tablet  Generic drug:  lamoTRIgine  Take 100-150 mg by mouth 2 (two) times daily. 1 tab in am, 1.5 tab in pm     Liraglutide 18 MG/3ML Sopn  Commonly known as:  VICTOZA  Inject 1.8 mg into the skin daily.     meloxicam 15 MG tablet  Commonly known as:  MOBIC  Take 15 mg by mouth daily.     metroNIDAZOLE 500 MG tablet  Commonly known as:  FLAGYL  Take 1  tablet (500 mg total) by mouth 3 (three) times daily.     nebivolol 10 MG tablet  Commonly known as:  BYSTOLIC  Take 10 mg by mouth daily.     olmesartan 40 MG tablet  Commonly known as:  BENICAR  Take 40 mg by mouth daily.     omega-3 acid ethyl esters 1 G capsule  Commonly known as:  LOVAZA  Take 1 g by mouth daily.     Pancrelipase (Lip-Prot-Amyl) 25000 UNITS Cpep  Take 2 capsules by mouth 3 (three) times daily.     potassium chloride SA 20 MEQ tablet  Commonly known as:  K-DUR,KLOR-CON  Take 20 mEq by mouth daily.     sertraline 100 MG tablet  Commonly known as:  ZOLOFT  Take 100 mg by mouth daily.     traMADol 50 MG tablet  Commonly known as:  ULTRAM  Take 50 mg by mouth every 6 (six) hours as needed for pain.        Allergies:  Allergies  Allergen Reactions  . Codeine     Headache and "just feel off"  . Penicillins Hives  . Sulfa Antibiotics Hives  . Other Rash    Bleach    Past Medical History  Diagnosis Date  . Myocardial infarction   . Respiratory failure      multifactorial secondary to congestive heart failure, morbid obesity, decreased abdominal  compliance in the setting of pancreatitis.  . Diastolic heart failure   . Leukocytosis     secondary to steroid and pancreatitis.  . DM (diabetes mellitus)   . HTN (hypertension)   . Seizure disorder   . Hyperlipidemia   . Back pain   . Obstructive sleep apnea   . Empty sella     on MRI in 2009.  . Diverticulitis   . Abnormal liver function      in the past.  . Headache(784.0)   . Pneumonia   . Stroke   . Seizures   . Pancreatitis   . Family history of anesthesia complication     History of a seizure  . Anemia   . Depression     Past Surgical History  Procedure Laterality Date  . Cholecystectomy    .  Abdominal hysterectomy    . Tubal ligation    . Tonsillectomy    . Appendectomy    . Cardiac catheterization      jan 2012  . Rectal polypectomy    . Eye cysts    . Back surgery     . Colonoscopy N/A 08/15/2012    Procedure: COLONOSCOPY;  Surgeon: Willis Modena, MD;  Location: [facility];  Service: Endoscopy;  Laterality: N/A;    Family History  Problem Relation Age of Onset  . Allergies Father   . Heart disease Father     before 10  . Heart failure Father   . Hypertension Father   . Hyperlipidemia Father   . Heart disease Mother   . Diabetes Mother   . Hypertension Mother   . Hyperlipidemia Mother   . Hypertension Sister   . Heart disease Sister     before 23  . Hyperlipidemia Sister   . Cancer Other     Social History:  reports that she quit smoking about 31 years ago. Her smoking use included Cigarettes. She has a 12.5 pack-year smoking history. She has never used smokeless tobacco. She reports that she does not drink alcohol or use illicit drugs.    Examination:   BP 130/82  Pulse 72  Temp(Src) 98.7 F (37.1 C)  Resp 12  Ht 5\' 1"  (1.549 m)  Wt 242 lb (109.77 kg)  BMI 45.75 kg/m2  SpO2 98%  Body mass index is 45.75 kg/(m^2).   Assesment:   Diabetes type 2, uncontrolled - 250.02  Is somewhat difficult to assess her blood sugar control today since A1c result is not available and she has not brought her monitor for review. However she appears to have fairly high readings at lunch and occasionally at supper also She may not be watching her diet as well causing higher readings since her A1c was excellent on the previous visit with the same regimen Her blood sugars are fairly good in the morning and not clear if the Lantus is lasting 24 hours Mealtime control is not as good and this may be partly because of her taking evening Humalog postprandially. She is also not taking it with her when she is eating out  PLAN:  Change Lantus to the morning for better glycemic control during the day We will adjust the dose based on her fasting blood sugars on her next visit Start taking Humalog at breakfast also and consider adding at lunchtime Discussed  needing to follow instructions given by dietitian previously and make better choices for breakfast instead of just eating cereal  She will need to bring home monitor for review on each visit and continue checking some readings at least once a day after meals Increase activity as tolerated Continue Victoza in the evening  Deretha Ertle 04/06/2013, 10:53 AM    Office Visit on 04/06/2013  Component Date Value Range Status  . Hemoglobin A1C 04/06/2013 8.1* 4.6 - 6.5 % Final   Glycemic Control Guidelines for People with Diabetes:Non Diabetic:  <6%Goal of Therapy: <7%Additional Action Suggested:  >8%   . Sodium 04/06/2013 138  135 - 145 mEq/L Final  . Potassium 04/06/2013 3.9  3.5 - 5.1 mEq/L Final  . Chloride 04/06/2013 101  96 - 112 mEq/L Final  . CO2 04/06/2013 29  19 - 32 mEq/L Final  . Glucose, Bld 04/06/2013 117* 70 - 99 mg/dL Final  . BUN 45/40/9811 13  6 - 23 mg/dL Final  . Creatinine, Ser 04/06/2013 0.9  0.4 - 1.2 mg/dL Final  . Total Bilirubin 04/06/2013 0.4  0.3 - 1.2 mg/dL Final  . Alkaline Phosphatase 04/06/2013 96  39 - 117 U/L Final  . AST 04/06/2013 19  0 - 37 U/L Final  . ALT 04/06/2013 10  0 - 35 U/L Final  . Total Protein 04/06/2013 7.8  6.0 - 8.3 g/dL Final  . Albumin 16/03/9603 3.8  3.5 - 5.2 g/dL Final  . Calcium 54/02/8118 9.1  8.4 - 10.5 mg/dL Final  . GFR 14/78/2956 84.04  >60.00 mL/min Final

## 2013-04-06 NOTE — Patient Instructions (Addendum)
Take Humalog right before BREAKFAST AND supper; start 4 units Humalog  CHANGE LANTUS TO AM ONLY Can take Victoza at supper  Please check blood sugars at least half the time about 2 hours after any meal and every 2 days waking up.  Please bring blood sugar monitor to each visit

## 2013-04-08 ENCOUNTER — Ambulatory Visit (INDEPENDENT_AMBULATORY_CARE_PROVIDER_SITE_OTHER): Payer: Medicare HMO

## 2013-04-08 DIAGNOSIS — M79609 Pain in unspecified limb: Secondary | ICD-10-CM

## 2013-04-08 NOTE — Procedures (Signed)
  HISTORY:  [name] is a 67 year old patient with a history of diabetes who reports bilateral foot pain, left greater right, for several months prior to this evaluation. The patient is being evaluated for the discomfort.  NERVE CONDUCTION STUDIES:  Nerve conduction studies were performed on both lower extremities. The distal motor latencies for the peroneal and posterior tibial nerves were normal bilaterally, with normal motor amplitudes for the peroneal nerves bilaterally, low motor amplitudes for the posterior tibial nerves bilaterally. The nerve conduction velocities for the peroneal and posterior tibial nerves were normal bilaterally, with normal H reflex latencies bilaterally and normal peroneal sensory latencies bilaterally. The medial and lateral plantar sensory latencies were unobtainable bilaterally.  EMG STUDIES:  EMG evaluation was not performed.  IMPRESSION:  Nerve conduction studies done on both lower extremities shows evidence of sensory dysfunction involving the medial and lateral plantar nerves of the feet. This could be consistent with an early peripheral neuropathy or bilateral tarsal tunnel syndrome. Clinical correlation is required. The lowering of motor amplitudes for the posterior to nerves likely represent distal dysfunction of theses nerves, not a more proximal S1 nerve root involvement, as the H reflex latencies are normal.  Marlan Palau MD 04/08/2013 11:52 AM  [facility] [address] [place], [place] [address]  Phone [phone] Fax [phone]

## 2013-04-17 ENCOUNTER — Telehealth: Payer: Self-pay | Admitting: Endocrinology

## 2013-04-17 MED ORDER — LIRAGLUTIDE 18 MG/3ML ~~LOC~~ SOPN: 1.8000 mg | PEN_INJECTOR | Freq: Every day | SUBCUTANEOUS | Status: DC

## 2013-04-17 NOTE — Telephone Encounter (Signed)
Re-sent rx into pharmacy.  

## 2013-04-30 ENCOUNTER — Other Ambulatory Visit: Payer: Self-pay | Admitting: *Deleted

## 2013-04-30 MED ORDER — LIRAGLUTIDE 18 MG/3ML ~~LOC~~ SOPN: 1.8000 mg | PEN_INJECTOR | Freq: Every day | SUBCUTANEOUS | Status: DC

## 2013-05-04 ENCOUNTER — Other Ambulatory Visit: Payer: Self-pay | Admitting: *Deleted

## 2013-05-04 MED ORDER — INSULIN GLARGINE 100 UNIT/ML ~~LOC~~ SOLN: 34.0000 [IU] | Freq: Every day | SUBCUTANEOUS | Status: DC

## 2013-05-05 ENCOUNTER — Other Ambulatory Visit: Payer: Medicare HMO

## 2013-05-06 ENCOUNTER — Other Ambulatory Visit (INDEPENDENT_AMBULATORY_CARE_PROVIDER_SITE_OTHER): Payer: Medicare HMO

## 2013-05-06 DIAGNOSIS — E785 Hyperlipidemia, unspecified: Secondary | ICD-10-CM

## 2013-05-06 LAB — BASIC METABOLIC PANEL
BUN: 15 mg/dL (ref 6–23)
CO2: 29 mEq/L (ref 19–32)
Calcium: 9.3 mg/dL (ref 8.4–10.5)
Chloride: 101 mEq/L (ref 96–112)
Creatinine, Ser: 1.1 mg/dL (ref 0.4–1.2)
GFR: 65.46 mL/min (ref >60.00)
Glucose, Bld: 124 mg/dL — ABNORMAL HIGH (ref 70–99)
Potassium: 3.8 mEq/L (ref 3.5–5.1)
Sodium: 138 mEq/L (ref 135–145)

## 2013-05-06 LAB — LIPID PANEL
Cholesterol: 231 mg/dL — ABNORMAL HIGH (ref 0–200)
HDL: 65.5 mg/dL (ref >39.00)
Total CHOL/HDL Ratio: 4
Triglycerides: 128 mg/dL (ref 0.0–149.0)
VLDL: 25.6 mg/dL (ref 0.0–40.0)

## 2013-05-06 LAB — LDL CHOLESTEROL, DIRECT: Direct LDL: 146.1 mg/dL

## 2013-05-07 ENCOUNTER — Ambulatory Visit (INDEPENDENT_AMBULATORY_CARE_PROVIDER_SITE_OTHER): Payer: Commercial Managed Care - HMO | Admitting: Endocrinology

## 2013-05-07 ENCOUNTER — Encounter: Payer: Self-pay | Admitting: Endocrinology

## 2013-05-07 ENCOUNTER — Other Ambulatory Visit: Payer: Self-pay | Admitting: *Deleted

## 2013-05-07 VITALS — BP 130/72 | HR 60 | Temp 98.6°F | Resp 12 | Ht 61.0 in | Wt 243.5 lb

## 2013-05-07 DIAGNOSIS — I10 Essential (primary) hypertension: Secondary | ICD-10-CM

## 2013-05-07 DIAGNOSIS — IMO0001 Reserved for inherently not codable concepts without codable children: Secondary | ICD-10-CM

## 2013-05-07 DIAGNOSIS — E785 Hyperlipidemia, unspecified: Secondary | ICD-10-CM

## 2013-05-07 MED ORDER — ATORVASTATIN CALCIUM 40 MG PO TABS: 40.0000 mg | ORAL_TABLET | Freq: Every evening | ORAL | Status: DC

## 2013-05-07 NOTE — Progress Notes (Signed)
Patient ID: [name], female   DOB: 04/17/1946, 67 y.o.   MRN: [id number]  Reason for Appointment: Diabetes follow-up   History of Present Illness   Diagnosis: Type 2 DIABETES MELITUS, date of diagnosis: 2008       PAST history: She has been on insulin for a few years Her blood sugars had initially responded very well to adding Victoza to her basal insulin in 2/13. She had lost weight and was able to comply with diet consistently. A1c had gone down to 7.2%. Also was needing less Levemir insulin .  However blood sugars will be higher if she goes off her diet  With good compliance her A1c was near normal at 6.3 in 5/14  RECENT HISTORY:  Her fasting blood sugars are relatively good but not consistent. A1c was high last month when she was told to add mealtime insulin to breakfast also Again her blood sugars are looking higher after meals but she has not checked except randomly recently Currently she is taking mealtime coverage at breakfast and suppertime but only 4 units which may not be adequate also Also not exercising as regularly. She is compliant with her Victoza at night usually although was running out of her prescription this week She thinks her satiety is somewhat better with taking full dose of Victoza  Her companion thinks that she is eating large portions and has not lost any weight   Insulin regimen: Lantus 26 units in and Humalog 4 units bfst supper     Oral hypoglycemic drugs: None  Side effects from medications:  diarrhea from metformin and occasional transient nausea ? from Victoza           Proper timing of medications in relation to meals: Yes.         Monitors blood glucose:  twice a day.    Glucometer:  Accu-Chek        Blood Glucose readings by meter download: readings before breakfast: 104-151, evening 242, 194 at 6 PM Hypoglycemia frequency:  none Meals: 3 meals per day.        Physical activity:  is walking on some days, previously using exercise machines             Meals: 3 meals per day, eating out more now, variably controlling portions; does not consume any sweet drinks. Snacks: At times Dietician visit: Most recent: 6/13.   Wt Readings from Last 3 Encounters:  05/07/13 243 lb 8 oz (110.451 kg)  04/06/13 242 lb (109.77 kg)  12/31/12 237 lb 1.6 oz (107.548 kg)   Lab Results  Component Value Date   HGBA1C 8.1* 04/06/2013   HGBA1C 6.2* 10/31/2012   HGBA1C 8.1* 08/14/2012   Lab Results  Component Value Date   MICROALBUR 0.9 12/29/2012   LDLCALC 106* 01/03/2011   CREATININE 1.1 05/06/2013       Medication List       This list is accurate as of: 05/07/13  1:50 PM.  Always use your most recent med list.               accu-chek multiclix lancets  Use 3 times per day     albuterol 108 (90 BASE) MCG/ACT inhaler  Commonly known as:  PROVENTIL HFA;VENTOLIN HFA  Inhale 2 puffs into the lungs every 6 (six) hours as needed for wheezing.     ARIPiprazole 10 MG tablet  Commonly known as:  ABILIFY  Take 10 mg by mouth daily.     aspirin  EC 81 MG tablet  Take 1 tablet (81 mg total) by mouth at bedtime.     atorvastatin 10 MG tablet  Commonly known as:  LIPITOR  Take 10 mg by mouth every evening.     B-D SINGLE USE SWABS REGULAR Pads     carbidopa-levodopa 25-100 MG per tablet  Commonly known as:  SINEMET IR     ferrous gluconate 324 MG tablet  Commonly known as:  FERGON  Take 324 mg by mouth every evening.     furosemide 40 MG tablet  Commonly known as:  LASIX  Take 40 mg by mouth every evening.     glucose blood test strip  Commonly known as:  ACCU-CHEK SMARTVIEW  accu-chek smart view test strips, DAW, dx code 250     HYDROcodone-acetaminophen 5-325 MG per tablet  Commonly known as:  NORCO/VICODIN     insulin glargine 100 UNIT/ML injection  Commonly known as:  LANTUS  Inject 0.34 mLs (34 Units total) into the skin daily.     insulin lispro 100 UNIT/ML injection  Commonly known as:  HUMALOG  Inject 6 Units into the  skin daily before supper.     isosorbide dinitrate 30 MG tablet  Commonly known as:  ISORDIL  Take 30 mg by mouth every morning.     isosorbide mononitrate 30 MG 24 hr tablet  Commonly known as:  IMDUR     LAMICTAL 100 MG tablet  Generic drug:  lamoTRIgine  Take 100-150 mg by mouth 2 (two) times daily. 1 tab in am, 1.5 tab in pm     Liraglutide 18 MG/3ML Sopn  Commonly known as:  VICTOZA  Inject 1.8 mg into the skin daily.     meloxicam 15 MG tablet  Commonly known as:  MOBIC  Take 15 mg by mouth daily.     metroNIDAZOLE 500 MG tablet  Commonly known as:  FLAGYL  Take 1 tablet (500 mg total) by mouth 3 (three) times daily.     nebivolol 10 MG tablet  Commonly known as:  BYSTOLIC  Take 10 mg by mouth daily.     olmesartan 40 MG tablet  Commonly known as:  BENICAR  Take 40 mg by mouth daily.     omega-3 acid ethyl esters 1 G capsule  Commonly known as:  LOVAZA  Take 1 g by mouth daily.     Pancrelipase (Lip-Prot-Amyl) 25000 UNITS Cpep  Take 2 capsules by mouth 3 (three) times daily.     potassium chloride SA 20 MEQ tablet  Commonly known as:  K-DUR,KLOR-CON  Take 20 mEq by mouth daily.     sertraline 100 MG tablet  Commonly known as:  ZOLOFT  Take 100 mg by mouth daily.     traMADol 50 MG tablet  Commonly known as:  ULTRAM  Take 50 mg by mouth every 6 (six) hours as needed for pain.        Allergies:  Allergies  Allergen Reactions  . Codeine     Headache and "just feel off"  . Penicillins Hives  . Sulfa Antibiotics Hives  . Other Rash    Bleach    Past Medical History  Diagnosis Date  . Myocardial infarction   . Respiratory failure      multifactorial secondary to congestive heart failure, morbid obesity, decreased abdominal  compliance in the setting of pancreatitis.  . Diastolic heart failure   . Leukocytosis     secondary to steroid and pancreatitis.  [place] Kitchen  DM (diabetes mellitus)   . HTN (hypertension)   . Seizure disorder   . Hyperlipidemia    . Back pain   . Obstructive sleep apnea   . Empty sella     on MRI in 2009.  . Diverticulitis   . Abnormal liver function      in the past.  . Headache(784.0)   . Pneumonia   . Stroke   . Seizures   . Pancreatitis   . Family history of anesthesia complication     History of a seizure  . Anemia   . Depression     Past Surgical History  Procedure Laterality Date  . Cholecystectomy    . Abdominal hysterectomy    . Tubal ligation    . Tonsillectomy    . Appendectomy    . Cardiac catheterization      jan 2012  . Rectal polypectomy    . Eye cysts    . Back surgery    . Colonoscopy N/A 08/15/2012    Procedure: COLONOSCOPY;  Surgeon: Willis Modena, MD;  Location: [facility];  Service: Endoscopy;  Laterality: N/A;    Family History  Problem Relation Age of Onset  . Allergies Father   . Heart disease Father     before 49  . Heart failure Father   . Hypertension Father   . Hyperlipidemia Father   . Heart disease Mother   . Diabetes Mother   . Hypertension Mother   . Hyperlipidemia Mother   . Hypertension Sister   . Heart disease Sister     before 68  . Hyperlipidemia Sister   . Cancer Other     Social History:  reports that she quit smoking about 31 years ago. Her smoking use included Cigarettes. She has a 12.5 pack-year smoking history. She has never used smokeless tobacco. She reports that she does not drink alcohol or use illicit drugs.  ROS:  She has had regular eye exams  Microalbumin has been normal  Hypertension appears well controlled  Hypercholesterolemia:She thinks she is taking her Lipitor but her LDL is significantly high  Foot pain at times, apparently from her flat feet. Not able to walk consistently   Examination:   BP 130/72  Pulse 60  Temp(Src) 98.6 F (37 C)  Resp 12  Ht 5\' 1"  (1.549 m)  Wt 243 lb 8 oz (110.451 kg)  BMI 46.03 kg/m2  SpO2 95%  Body mass index is 46.03 kg/(m^2).   No ankle edema  Assesment:   Diabetes type 2,  uncontrolled - 250.02  Is  difficult to assess her blood sugar control today since A1c is not due yet and she is taking readings only in the morning On her last visit she was told to bring her glucose monitor and start mealtime coverage with 6 units at breakfast and supper but is taking only 4 units. Also was told to take Lantus in the morning Still has difficulty complying with diet and controlling portions Has a high reading yesterday after a big lunch Was also advised on taking insulin before eating in the last visit She does have instructions from dietitian last year but has not been watching diet   HYPERCHOLESTEROLEMIA: She does need better management of her lipids, LDL target 100 and is now 161, not clear if she is compliant with her Lipitor, has not had any side effects from this  PLAN:  DIABETES: Start taking Humalog at lunch also if eating a large meal Start monitoring at  least half of the readings after meals Increase suppertime Humalog to 6 units Discussed needing to follow instructions given by dietitian previously and make better choices for breakfast instead of just eating cereal  She will keep a three-day record of meals eaten along with pre-and postprandial blood sugars and review in detail with nurse educator in about a month. Insulin doses be adjusted accordingly To have an A1c checked on the next visit Resume 1.8 mg Victoza in the evening, prescription sent  LIPIDS: Increase Lipitor to 40 mg and will followup with PCP  Counseling time over 50% of today's 25 minute visit  [name] 05/07/2013, 1:50 PM    Appointment on 05/06/2013  Component Date Value Range Status  . Cholesterol 05/06/2013 231* 0 - 200 mg/dL Final   ATP III Classification       Desirable:  < 200 mg/dL               Borderline High:  200 - 239 mg/dL          High:  > = 956 mg/dL  . Triglycerides 05/06/2013 128.0  0.0 - 149.0 mg/dL Final   Normal:  <213 mg/dLBorderline High:  150 - 199 mg/dL  .  HDL 05/06/2013 65.50  >39.00 mg/dL Final  . VLDL 08/65/7846 25.6  0.0 - 40.0 mg/dL Final  . Total CHOL/HDL Ratio 05/06/2013 4   Final                  Men          Women1/2 Average Risk     3.4          3.3Average Risk          5.0          4.42X Average Risk          9.6          7.13X Average Risk          15.0          11.0                      . Sodium 05/06/2013 138  135 - 145 mEq/L Final  . Potassium 05/06/2013 3.8  3.5 - 5.1 mEq/L Final  . Chloride 05/06/2013 101  96 - 112 mEq/L Final  . CO2 05/06/2013 29  19 - 32 mEq/L Final  . Glucose, Bld 05/06/2013 124* 70 - 99 mg/dL Final  . BUN 96/29/5284 15  6 - 23 mg/dL Final  . Creatinine, Ser 05/06/2013 1.1  0.4 - 1.2 mg/dL Final  . Calcium 13/24/4010 9.3  8.4 - 10.5 mg/dL Final  . GFR 27/25/3664 65.46  >60.00 mL/min Final  . Direct LDL 05/06/2013 146.1   Final   Optimal:  <100 mg/dLNear or Above Optimal:  100-129 mg/dLBorderline High:  130-159 mg/dLHigh:  160-189 mg/dLVery High:  >190 mg/dL

## 2013-05-07 NOTE — Patient Instructions (Addendum)
Restart Victoza 1.8  Lantus 26 in am  HUMALOG 4 AT BFST 4 AT LUNCH AND 6 AT SUPPER  Please check blood sugars at least half the time about 2 hours after any meal and as directed on waking up.  Please bring blood sugar monitor to each visit  Lipitor 40mg  daily

## 2013-05-12 ENCOUNTER — Encounter: Payer: Medicare HMO | Admitting: Nutrition

## 2013-05-12 ENCOUNTER — Other Ambulatory Visit: Payer: Self-pay | Admitting: *Deleted

## 2013-05-12 MED ORDER — LIRAGLUTIDE 18 MG/3ML ~~LOC~~ SOPN: 1.8000 mg | PEN_INJECTOR | Freq: Every day | SUBCUTANEOUS | Status: DC

## 2013-05-13 ENCOUNTER — Encounter: Payer: Medicare HMO | Attending: Endocrinology | Admitting: Nutrition

## 2013-05-13 NOTE — Patient Instructions (Signed)
1.  Stop the regular sodas 2. Add protein to breakfasts and lunches.  Suggestions given to her for this. 3.  Increase Lantus dose to 28u.

## 2013-05-13 NOTE — Progress Notes (Signed)
Insulin dose:  This patient reports that she is taking her Humalog before meals, except when eating out.  She says that she forgets the pen.  She eats out at lunch 1-2 times/wk.   She reports taking Lantus 26u q AM., and her Victoza Diet: She appears to be eating 2 carb servings at breakfast, 3 at lunch and 3 at supper.  Breakfasts and lunchs do not always have protein in them.  She admits to occassionaly having a regular Sprite for lunch.  She does no exercise.    SBGM:  She did not bring her meter.  She is testing acB and acS.  Says FBSs are around 160s.  Today it was 149.  AcS last night was 155.    Plan:   1.  Stop the regular sodas 2. Add protein to breakfasts and lunches.  Suggestions given to her for this. 3.  Increase Lantus dose to 28u.

## 2013-06-11 ENCOUNTER — Other Ambulatory Visit: Payer: Self-pay | Admitting: *Deleted

## 2013-06-11 MED ORDER — GLUCOSE BLOOD VI STRP: ORAL_STRIP | Status: DC

## 2013-07-02 ENCOUNTER — Other Ambulatory Visit (INDEPENDENT_AMBULATORY_CARE_PROVIDER_SITE_OTHER): Payer: Medicare HMO

## 2013-07-02 DIAGNOSIS — E1165 Type 2 diabetes mellitus with hyperglycemia: Principal | ICD-10-CM

## 2013-07-02 DIAGNOSIS — IMO0001 Reserved for inherently not codable concepts without codable children: Secondary | ICD-10-CM

## 2013-07-02 LAB — COMPREHENSIVE METABOLIC PANEL
ALT: 23 U/L (ref 0–35)
AST: 19 U/L (ref 0–37)
Albumin: 3.7 g/dL (ref 3.5–5.2)
Alkaline Phosphatase: 107 U/L (ref 39–117)
BUN: 9 mg/dL (ref 6–23)
CO2: 31 mEq/L (ref 19–32)
Calcium: 8.6 mg/dL (ref 8.4–10.5)
Chloride: 99 mEq/L (ref 96–112)
Creatinine, Ser: 0.9 mg/dL (ref 0.4–1.2)
GFR: 79.73 mL/min (ref >60.00)
Glucose, Bld: 190 mg/dL — ABNORMAL HIGH (ref 70–99)
Potassium: 3.9 mEq/L (ref 3.5–5.1)
Sodium: 137 mEq/L (ref 135–145)
Total Bilirubin: 0.4 mg/dL (ref 0.3–1.2)
Total Protein: 7.7 g/dL (ref 6.0–8.3)

## 2013-07-02 LAB — LIPID PANEL
Cholesterol: 166 mg/dL (ref 0–200)
HDL: 60.8 mg/dL (ref >39.00)
LDL Cholesterol: 89 mg/dL (ref 0–99)
Total CHOL/HDL Ratio: 3
Triglycerides: 80 mg/dL (ref 0.0–149.0)
VLDL: 16 mg/dL (ref 0.0–40.0)

## 2013-07-02 LAB — HEMOGLOBIN A1C: Hgb A1c MFr Bld: 8.2 % — ABNORMAL HIGH (ref 4.6–6.5)

## 2013-07-03 ENCOUNTER — Other Ambulatory Visit: Payer: Medicare HMO

## 2013-07-07 ENCOUNTER — Ambulatory Visit (INDEPENDENT_AMBULATORY_CARE_PROVIDER_SITE_OTHER): Payer: Medicare HMO | Admitting: Endocrinology

## 2013-07-07 ENCOUNTER — Encounter: Payer: Self-pay | Admitting: Endocrinology

## 2013-07-07 ENCOUNTER — Ambulatory Visit: Payer: Medicare HMO | Admitting: Endocrinology

## 2013-07-07 VITALS — BP 128/82 | HR 77 | Temp 98.3°F | Resp 16 | Ht 61.0 in | Wt 245.1 lb

## 2013-07-07 DIAGNOSIS — E1165 Type 2 diabetes mellitus with hyperglycemia: Principal | ICD-10-CM

## 2013-07-07 DIAGNOSIS — E785 Hyperlipidemia, unspecified: Secondary | ICD-10-CM

## 2013-07-07 DIAGNOSIS — IMO0001 Reserved for inherently not codable concepts without codable children: Secondary | ICD-10-CM

## 2013-07-07 MED ORDER — CANAGLIFLOZIN 300 MG PO TABS: 300.0000 mg | ORAL_TABLET | Freq: Every day | ORAL | Status: DC

## 2013-07-07 NOTE — Patient Instructions (Addendum)
Invokana in am, sample 100mg  Rx 300mg  Call if sugar < 90  Reduce Lasix to 1/2, monitor BP 3x per week Resume exercise

## 2013-07-07 NOTE — Progress Notes (Addendum)
Patient ID: [name], female   DOB: [birth year], 68 y.o.   MRN: [id number]  Reason for Appointment: Diabetes follow-up   History of Present Illness   Diagnosis: Type 2 DIABETES MELITUS, date of diagnosis: 2008       PAST history: She has been on insulin for a few years Her blood sugars had initially responded very well to adding Victoza to her basal insulin in 2/13. She had lost weight and was able to comply with diet consistently. A1c had gone down to 7.2%. Also was needing less Levemir insulin .  However blood sugars will be higher if she goes off her diet  With good compliance her A1c was near normal at 6.3 in 5/14  RECENT HISTORY:  Her A1c is again significantly high and likely to be from inconsistent diet and she does have periodic high readings at all times including overnight and even before supper time Postprandial readings do not seem to go up much higher after supper time This is despite her taking 1.8 mg of Victoza more consistently right at suppertime. Also was started on Humalog at lunchtime for meal coverage and she will take her insulin pen with her if eating out Also not exercising at all recently She was previously having difficulties with weight loss because of portion control and snacks as well as eating out She was seen by nurse educator recently and advised to have balanced meals with more protein as well avoid all regular soft drinks   Insulin regimen: Lantus 28 units in am; Humalog 4 units breakfast and lunch, 6 units supper     Oral hypoglycemic drugs: None  Side effects from medications:  diarrhea from metformin            Proper timing of medications in relation to meals: Yes.  occasionally will miss Humalog at lunchtime    Monitors blood glucose:  twice a day.    Glucometer:  Accu-Chek     PREMEAL Breakfast Lunch Dinner Bedtime Overall  Glucose range:  118-288   167  108-240     Mean/median:  187    158    172    POST-MEAL PC Breakfast PC Lunch PC  Dinner  Glucose range:  165  ?   115-375   Mean/median:      Asleep Hypoglycemia frequency:  none Meals: 3 meals per day.        Physical activity:   previously using exercise machines            Meals: 3 meals per day,  Dietician visit: Most recent: 6/13.   Wt Readings from Last 3 Encounters:  07/07/13 245 lb 1.6 oz (111.177 kg)  05/07/13 243 lb 8 oz (110.451 kg)  04/06/13 242 lb (109.77 kg)   Lab Results  Component Value Date   HGBA1C 8.2* 07/02/2013   HGBA1C 8.1* 04/06/2013   HGBA1C 6.2* 10/31/2012   Lab Results  Component Value Date   MICROALBUR 0.9 12/29/2012   LDLCALC 89 07/02/2013   CREATININE 0.9 07/02/2013       Medication List       This list is accurate as of: 07/07/13 11:59 PM.  Always use your most recent med list.               accu-chek multiclix lancets  Use 3 times per day     albuterol 108 (90 BASE) MCG/ACT inhaler  Commonly known as:  PROVENTIL HFA;VENTOLIN HFA  Inhale 2 puffs into the lungs every  6 (six) hours as needed for wheezing.     ARIPiprazole 10 MG tablet  Commonly known as:  ABILIFY  Take 10 mg by mouth daily.     aspirin EC 81 MG tablet  Take 1 tablet (81 mg total) by mouth at bedtime.     atorvastatin 40 MG tablet  Commonly known as:  LIPITOR  Take 1 tablet (40 mg total) by mouth every evening.     B-D SINGLE USE SWABS REGULAR Pads     Canagliflozin 300 MG Tabs  Commonly known as:  INVOKANA  Take 1 tablet (300 mg total) by mouth daily before breakfast.     carbidopa-levodopa 25-100 MG per tablet  Commonly known as:  SINEMET IR     ferrous gluconate 324 MG tablet  Commonly known as:  FERGON  Take 324 mg by mouth every evening.     furosemide 40 MG tablet  Commonly known as:  LASIX  Take 40 mg by mouth every evening.     glucose blood test strip  Commonly known as:  ACCU-CHEK SMARTVIEW  accu-chek smart view test strips, use to check blood sugars 2 times per day DAW, dx code 250     HYDROcodone-acetaminophen 5-325 MG per  tablet  Commonly known as:  NORCO/VICODIN     insulin glargine 100 UNIT/ML injection  Commonly known as:  LANTUS  Inject 0.34 mLs (34 Units total) into the skin daily.     insulin lispro 100 UNIT/ML injection  Commonly known as:  HUMALOG  Inject 4-6 Units into the skin 3 (three) times daily with meals.     isosorbide dinitrate 30 MG tablet  Commonly known as:  ISORDIL  Take 30 mg by mouth every morning.     isosorbide mononitrate 30 MG 24 hr tablet  Commonly known as:  IMDUR     LAMICTAL 100 MG tablet  Generic drug:  lamoTRIgine  Take 100-150 mg by mouth 2 (two) times daily. 1 tab in am, 1.5 tab in pm     Liraglutide 18 MG/3ML Sopn  Commonly known as:  VICTOZA  Inject 1.8 mg into the skin daily.     meloxicam 15 MG tablet  Commonly known as:  MOBIC  Take 15 mg by mouth daily.     nebivolol 10 MG tablet  Commonly known as:  BYSTOLIC  Take 10 mg by mouth daily.     olmesartan 40 MG tablet  Commonly known as:  BENICAR  Take 40 mg by mouth daily.     omega-3 acid ethyl esters 1 G capsule  Commonly known as:  LOVAZA  Take 1 g by mouth daily.     Pancrelipase (Lip-Prot-Amyl) 25000 UNITS Cpep  Take 2 capsules by mouth 3 (three) times daily.     potassium chloride SA 20 MEQ tablet  Commonly known as:  K-DUR,KLOR-CON  Take 20 mEq by mouth daily.     sertraline 100 MG tablet  Commonly known as:  ZOLOFT  Take 100 mg by mouth daily.     traMADol 50 MG tablet  Commonly known as:  ULTRAM  Take 50 mg by mouth every 6 (six) hours as needed for pain.        Allergies:  Allergies  Allergen Reactions  . Codeine     Headache and "just feel off"  . Penicillins Hives  . Sulfa Antibiotics Hives  . Other Rash    Bleach    Past Medical History  Diagnosis Date  . Myocardial infarction   .  Respiratory failure      multifactorial secondary to congestive heart failure, morbid obesity, decreased abdominal  compliance in the setting of pancreatitis.  . Diastolic heart  failure   . Leukocytosis     secondary to steroid and pancreatitis.  . DM (diabetes mellitus)   . HTN (hypertension)   . Seizure disorder   . Hyperlipidemia   . Back pain   . Obstructive sleep apnea   . Empty sella     on MRI in 2009.  . Diverticulitis   . Abnormal liver function      in the past.  . Headache(784.0)   . Pneumonia   . Stroke   . Seizures   . Pancreatitis   . Family history of anesthesia complication     History of a seizure  . Anemia   . Depression     Past Surgical History  Procedure Laterality Date  . Cholecystectomy    . Abdominal hysterectomy    . Tubal ligation    . Tonsillectomy    . Appendectomy    . Cardiac catheterization      jan 2012  . Rectal polypectomy    . Eye cysts    . Back surgery    . Colonoscopy N/A 08/15/2012    Procedure: COLONOSCOPY;  Surgeon: Arta Silence, MD;  Location: [facility];  Service: Endoscopy;  Laterality: N/A;    Family History  Problem Relation Age of Onset  . Allergies Father   . Heart disease Father     before 41  . Heart failure Father   . Hypertension Father   . Hyperlipidemia Father   . Heart disease Mother   . Diabetes Mother   . Hypertension Mother   . Hyperlipidemia Mother   . Hypertension Sister   . Heart disease Sister     before 21  . Hyperlipidemia Sister   . Cancer Other     Social History:  reports that she quit smoking about 31 years ago. Her smoking use included Cigarettes. She has a 12.5 pack-year smoking history. She has never used smokeless tobacco. She reports that she does not drink alcohol or use illicit drugs.  ROS:  She has had regular eye exams  Hypertension is well controlled  Hypercholesterolemia: On Lipitor, better compliance now and LDL is below 100  Lab Results  Component Value Date   CHOL 166 07/02/2013   HDL 60.80 07/02/2013   LDLCALC 89 07/02/2013   LDLDIRECT 146.1 05/06/2013   TRIG 80.0 07/02/2013   CHOLHDL 3 07/02/2013   No recent imaging or numbness in  feet.  Edema well controlled with Lasix 40 mg   Examination:   BP 128/82  Pulse 77  Temp(Src) 98.3 F (36.8 C)  Resp 16  Ht 5\' 1"  (1.549 m)  Wt 245 lb 1.6 oz (111.177 kg)  BMI 46.34 kg/m2  SpO2 94%  Body mass index is 46.34 kg/(m^2).   No ankle edema  Assesment:   Diabetes type 2, uncontrolled    A1c is still over 8% and blood sugars are overall poorly controlled Her blood sugars are not consistent and probably high from not watching her diet especially in the evenings and overnight. This is despite trying to have her take Victoza more consistently using 1.8 mg dose at suppertime and also start Humalog at lunchtime on the last visit Compliance with exercise also has been poor She will need a more effective method of controlling her blood sugar, since blood sugars are not consistently  high will not be able to control with insulin adjustment alone  HYPERCHOLESTEROLEMIA: She has therapeutic levels now with taking Lipitor consistently and using 40 mg, tolerating this well; liver functions normal  PLAN:   Trial of Invokana with 100 mg for the first 5 days and then 300 mg. She will use a 30 day voucher for the first prescription. Discussed the mechanism of action, benefits, effects on glucose, weight, blood pressure and fluid volume. Discussed possible side effects and treatment of candidiasis if it occurs  She may need insulin reduction and discussed when to call for this  Resume exercise and made more consistent with diet  Reduce Lasix to half tablet to avoid fluid volume loss or hypotension  Continue same dose of Lipitor  No change in Victoza  Counseling time over 50% of today's 25 minute visit  Renlee Floor 07/08/2013, 8:44 AM    Appointment on 07/02/2013  Component Date Value Range Status  . Hemoglobin A1C 07/02/2013 8.2* 4.6 - 6.5 % Final   Glycemic Control Guidelines for People with Diabetes:Non Diabetic:  <6%Goal of Therapy: <7%Additional Action Suggested:  >8%    . Sodium 07/02/2013 137  135 - 145 mEq/L Final  . Potassium 07/02/2013 3.9  3.5 - 5.1 mEq/L Final  . Chloride 07/02/2013 99  96 - 112 mEq/L Final  . CO2 07/02/2013 31  19 - 32 mEq/L Final  . Glucose, Bld 07/02/2013 190* 70 - 99 mg/dL Final  . BUN 74/25/9563 9  6 - 23 mg/dL Final  . Creatinine, Ser 07/02/2013 0.9  0.4 - 1.2 mg/dL Final  . Total Bilirubin 07/02/2013 0.4  0.3 - 1.2 mg/dL Final  . Alkaline Phosphatase 07/02/2013 107  39 - 117 U/L Final  . AST 07/02/2013 19  0 - 37 U/L Final  . ALT 07/02/2013 23  0 - 35 U/L Final  . Total Protein 07/02/2013 7.7  6.0 - 8.3 g/dL Final  . Albumin 87/56/4332 3.7  3.5 - 5.2 g/dL Final  . Calcium 95/18/8416 8.6  8.4 - 10.5 mg/dL Final  . GFR 60/63/0160 79.73  >60.00 mL/min Final  . Cholesterol 07/02/2013 166  0 - 200 mg/dL Final   ATP III Classification       Desirable:  < 200 mg/dL               Borderline High:  200 - 239 mg/dL          High:  > = 109 mg/dL  . Triglycerides 07/02/2013 80.0  0.0 - 149.0 mg/dL Final   Normal:  <323 mg/dLBorderline High:  150 - 199 mg/dL  . HDL 07/02/2013 60.80  >39.00 mg/dL Final  . VLDL 55/73/2202 16.0  0.0 - 40.0 mg/dL Final  . LDL Cholesterol 07/02/2013 89  0 - 99 mg/dL Final  . Total CHOL/HDL Ratio 07/02/2013 3   Final                  Men          Women1/2 Average Risk     3.4          3.3Average Risk          5.0          4.42X Average Risk          9.6          7.13X Average Risk          15.0          11.0

## 2013-07-21 ENCOUNTER — Other Ambulatory Visit: Payer: Self-pay | Admitting: *Deleted

## 2013-07-21 ENCOUNTER — Telehealth: Payer: Self-pay | Admitting: *Deleted

## 2013-07-21 MED ORDER — FLUCONAZOLE 150 MG PO TABS: 150.0000 mg | ORAL_TABLET | Freq: Once | ORAL | Status: DC

## 2013-07-21 NOTE — Telephone Encounter (Signed)
Pt says she has a yeast infection from the [place] she just started taking, she wants to know if she should stop taking it, or if there is another type of medicine she can take?  Or do you want diflucan called in?  Please advise

## 2013-07-21 NOTE — Telephone Encounter (Signed)
rx sent, patient is aware of instructions

## 2013-07-21 NOTE — Telephone Encounter (Signed)
Stop the medication for 3 days then start only half of the 300 mg daily For now take Diflucan 150 mg, 1 tablet, single dose

## 2013-07-31 ENCOUNTER — Other Ambulatory Visit: Payer: Self-pay | Admitting: *Deleted

## 2013-07-31 MED ORDER — INSULIN LISPRO 100 UNIT/ML (KWIKPEN): PEN_INJECTOR | SUBCUTANEOUS | Status: DC

## 2013-08-18 ENCOUNTER — Other Ambulatory Visit: Payer: Medicare HMO

## 2013-08-18 ENCOUNTER — Other Ambulatory Visit: Payer: Self-pay | Admitting: *Deleted

## 2013-08-18 ENCOUNTER — Telehealth: Payer: Self-pay | Admitting: *Deleted

## 2013-08-18 MED ORDER — LIRAGLUTIDE 18 MG/3ML ~~LOC~~ SOPN: 1.2000 mg | PEN_INJECTOR | Freq: Every day | SUBCUTANEOUS | Status: DC

## 2013-08-18 NOTE — Telephone Encounter (Signed)
Patient called about her blood sugars, her readings for the last few days are: 21st 8:43 am fasting 353, 5:21 pm 491 22nd 7:16 am not fasting 439 1:50 589, 3:09 509 7:34 479 23rd 7:47 356.  Patient states she has not started back on the Invokana after she completed the Diflucan, I told her she needed to start taking it again, but that I would let you know her readings and see what you wanted to do.  Please advise

## 2013-08-18 NOTE — Telephone Encounter (Signed)
rx sent

## 2013-08-18 NOTE — Telephone Encounter (Signed)
She has not been taking Victoza, for unknown duration. Also did not restart her Invokana after treating day candidiasis Has not had any steroids. She will restart Invokana and take only half of the 300 mg tablet Advised the patient to take 40 Lantus and twice as much Humalog until blood sugars are near 100 Victoza 0.6 for 3 days and 1.2 mg subsequently, needs new prescription

## 2013-08-21 ENCOUNTER — Ambulatory Visit: Payer: Medicare HMO | Admitting: Endocrinology

## 2013-08-24 ENCOUNTER — Other Ambulatory Visit: Payer: Self-pay | Admitting: *Deleted

## 2013-08-24 MED ORDER — FLUCONAZOLE 150 MG PO TABS: 150.0000 mg | ORAL_TABLET | Freq: Once | ORAL | Status: DC

## 2013-08-25 ENCOUNTER — Other Ambulatory Visit (INDEPENDENT_AMBULATORY_CARE_PROVIDER_SITE_OTHER): Payer: Medicare HMO

## 2013-08-25 DIAGNOSIS — IMO0001 Reserved for inherently not codable concepts without codable children: Secondary | ICD-10-CM

## 2013-08-25 DIAGNOSIS — E1165 Type 2 diabetes mellitus with hyperglycemia: Principal | ICD-10-CM

## 2013-08-25 LAB — BASIC METABOLIC PANEL
BUN: 14 mg/dL (ref 6–23)
CO2: 30 mEq/L (ref 19–32)
Calcium: 9.3 mg/dL (ref 8.4–10.5)
Chloride: 97 mEq/L (ref 96–112)
Creatinine, Ser: 1 mg/dL (ref 0.4–1.2)
GFR: 75.83 mL/min (ref >60.00)
Glucose, Bld: 180 mg/dL — ABNORMAL HIGH (ref 70–99)
Potassium: 3.8 mEq/L (ref 3.5–5.1)
Sodium: 135 mEq/L (ref 135–145)

## 2013-08-26 LAB — FRUCTOSAMINE: Fructosamine: 417 umol/L — ABNORMAL HIGH (ref <285)

## 2013-09-03 ENCOUNTER — Other Ambulatory Visit: Payer: Self-pay | Admitting: *Deleted

## 2013-09-03 ENCOUNTER — Ambulatory Visit (INDEPENDENT_AMBULATORY_CARE_PROVIDER_SITE_OTHER): Payer: Medicare HMO | Admitting: Endocrinology

## 2013-09-03 ENCOUNTER — Encounter: Payer: Self-pay | Admitting: Endocrinology

## 2013-09-03 VITALS — BP 132/88 | HR 79 | Temp 98.2°F | Resp 16 | Ht 61.0 in | Wt 249.6 lb

## 2013-09-03 DIAGNOSIS — IMO0001 Reserved for inherently not codable concepts without codable children: Secondary | ICD-10-CM

## 2013-09-03 DIAGNOSIS — E669 Obesity, unspecified: Secondary | ICD-10-CM

## 2013-09-03 DIAGNOSIS — E1165 Type 2 diabetes mellitus with hyperglycemia: Principal | ICD-10-CM

## 2013-09-03 DIAGNOSIS — I10 Essential (primary) hypertension: Secondary | ICD-10-CM

## 2013-09-03 MED ORDER — LIRAGLUTIDE 18 MG/3ML ~~LOC~~ SOPN: 1.2000 mg | PEN_INJECTOR | Freq: Every day | SUBCUTANEOUS | Status: DC

## 2013-09-03 NOTE — Progress Notes (Signed)
Patient ID: [name], female   DOB: 05-04-1946, 68 y.o.   MRN: [id number]   Reason for Appointment: Diabetes follow-up   History of Present Illness   Diagnosis: Type 2 DIABETES MELITUS, date of diagnosis: 2008       PAST history: She has been on insulin for a few years Her blood sugars had initially responded very well to adding Victoza to her basal insulin in 2/13. She had lost weight and was able to comply with diet consistently. A1c had gone down to 7.2%. Also was needing less Levemir insulin .  However blood sugars will be higher if she goes off her diet  With good compliance her A1c was near normal at 6.3 in 5/14  RECENT HISTORY:  Her blood sugars have been more difficult to control in the last few months This is despite trying to increase her insulin and covering all meals with Humalog She was also given a trial of Invokana but she had excessive candida vaginitis with this Also the patient stopped her Victoza without being specifically asked to do so when she was started on Invokana For unknown reasons her blood sugars were markedly increased in mid February, highest reading 589; insulin was increased at that point She did start back on her Victoza on 08/18/13 and her blood sugars appear to be improving significantly Current blood sugar trends/problems:  Fasting blood sugars are mostly better although not consistent and as high as 219, this may be related to either her diet and evenings or forgetting mealtime insulin  Occasionally has high readings in the afternoons over 200, possibly from missing her Humalog  Only rare hypoglycemia, once when probably she took her Humalog postprandially at 9 PM Her A1c was last checked in 06/2013 She also has had problems with inconsistent diet  However has started exercising more regularly although her weight has not improved yet She was seen by nurse educator in early 2015 and advised to have balanced meals with more protein as well avoid  all regular soft drinks   Insulin regimen: Lantus 40 units in am; Humalog 01-31-11 units      Oral hypoglycemic drugs: None  Side effects from medications:  diarrhea from metformin            Proper timing of medications in relation to meals: Yes.  occasionally will miss Humalog at lunchtime    Monitors blood glucose:  twice a day.    Glucometer:  Accu-Chek   PREMEAL Breakfast Lunch afternoon   evening  Overall  Glucose range:  115-239   97-184   105-206   67-338    Mean/median:         Hypoglycemia: Only one episode as above Meals: 3 meals per day.        Physical activity: [place]            Meals: 3 meals per day,  Dietician visit: Most recent: 6/13.   Wt Readings from Last 3 Encounters:  09/03/13 249 lb 9.6 oz (113.218 kg)  07/07/13 245 lb 1.6 oz (111.177 kg)  05/07/13 243 lb 8 oz (110.451 kg)   Lab Results  Component Value Date   HGBA1C 8.2* 07/02/2013   HGBA1C 8.1* 04/06/2013   HGBA1C 6.2* 10/31/2012   Lab Results  Component Value Date   MICROALBUR 0.9 12/29/2012   LDLCALC 89 07/02/2013   CREATININE 1.0 08/25/2013       Medication List       This list  is accurate as of: 09/03/13 10:38 AM.  Always use your most recent med list.               accu-chek multiclix lancets  Use 3 times per day     albuterol 108 (90 BASE) MCG/ACT inhaler  Commonly known as:  PROVENTIL HFA;VENTOLIN HFA  Inhale 2 puffs into the lungs every 6 (six) hours as needed for wheezing.     ARIPiprazole 10 MG tablet  Commonly known as:  ABILIFY  Take 10 mg by mouth daily.     aspirin EC 81 MG tablet  Take 1 tablet (81 mg total) by mouth at bedtime.     atorvastatin 40 MG tablet  Commonly known as:  LIPITOR  Take 1 tablet (40 mg total) by mouth every evening.     B-D SINGLE USE SWABS REGULAR Pads     carbidopa-levodopa 25-100 MG per tablet  Commonly known as:  SINEMET IR     ferrous gluconate 324 MG tablet  Commonly known as:  FERGON  Take 324 mg by mouth every  evening.     furosemide 40 MG tablet  Commonly known as:  LASIX  Take 40 mg by mouth every evening.     glucose blood test strip  Commonly known as:  ACCU-CHEK SMARTVIEW  accu-chek smart view test strips, use to check blood sugars 2 times per day DAW, dx code 250     HYDROcodone-acetaminophen 5-325 MG per tablet  Commonly known as:  NORCO/VICODIN     insulin glargine 100 UNIT/ML injection  Commonly known as:  LANTUS  Inject 40 Units into the skin daily.     insulin lispro 100 UNIT/ML KiwkPen  Commonly known as:  HUMALOG  Inject 8 units at breakfast and lunch and 12 units at supper     isosorbide dinitrate 30 MG tablet  Commonly known as:  ISORDIL  Take 30 mg by mouth every morning.     isosorbide mononitrate 30 MG 24 hr tablet  Commonly known as:  IMDUR     LAMICTAL 100 MG tablet  Generic drug:  lamoTRIgine  Take 100-150 mg by mouth 2 (two) times daily. 1 tab in am, 1.5 tab in pm     Liraglutide 18 MG/3ML Sopn  Commonly known as:  VICTOZA  Inject 1.2 mg into the skin daily.     meloxicam 15 MG tablet  Commonly known as:  MOBIC  Take 15 mg by mouth daily.     nebivolol 10 MG tablet  Commonly known as:  BYSTOLIC  Take 10 mg by mouth daily.     olmesartan 40 MG tablet  Commonly known as:  BENICAR  Take 40 mg by mouth daily.     omega-3 acid ethyl esters 1 G capsule  Commonly known as:  LOVAZA  Take 1 g by mouth daily.     Pancrelipase (Lip-Prot-Amyl) 25000 UNITS Cpep  Take 2 capsules by mouth 3 (three) times daily.     potassium chloride SA 20 MEQ tablet  Commonly known as:  K-DUR,KLOR-CON  Take 20 mEq by mouth daily.     sertraline 100 MG tablet  Commonly known as:  ZOLOFT  Take 100 mg by mouth daily.     traMADol 50 MG tablet  Commonly known as:  ULTRAM  Take 50 mg by mouth every 6 (six) hours as needed for pain.        Allergies:  Allergies  Allergen Reactions  . Codeine  Headache and "just feel off"  . Penicillins Hives  . Sulfa  Antibiotics Hives  . Other Rash    Bleach    Past Medical History  Diagnosis Date  . Myocardial infarction   . Respiratory failure      multifactorial secondary to congestive heart failure, morbid obesity, decreased abdominal  compliance in the setting of pancreatitis.  . Diastolic heart failure   . Leukocytosis     secondary to steroid and pancreatitis.  . DM (diabetes mellitus)   . HTN (hypertension)   . Seizure disorder   . Hyperlipidemia   . Back pain   . Obstructive sleep apnea   . Empty sella     on MRI in 2009.  . Diverticulitis   . Abnormal liver function      in the past.  . Headache(784.0)   . Pneumonia   . Stroke   . Seizures   . Pancreatitis   . Family history of anesthesia complication     History of a seizure  . Anemia   . Depression     Past Surgical History  Procedure Laterality Date  . Cholecystectomy    . Abdominal hysterectomy    . Tubal ligation    . Tonsillectomy    . Appendectomy    . Cardiac catheterization      jan 2012  . Rectal polypectomy    . Eye cysts    . Back surgery    . Colonoscopy N/A 08/15/2012    Procedure: COLONOSCOPY;  Surgeon: Arta Silence, MD;  Location: [facility];  Service: Endoscopy;  Laterality: N/A;    Family History  Problem Relation Age of Onset  . Allergies Father   . Heart disease Father     before 61  . Heart failure Father   . Hypertension Father   . Hyperlipidemia Father   . Heart disease Mother   . Diabetes Mother   . Hypertension Mother   . Hyperlipidemia Mother   . Hypertension Sister   . Heart disease Sister     before 58  . Hyperlipidemia Sister   . Cancer Other     Social History:  reports that she quit smoking about 31 years ago. Her smoking use included Cigarettes. She has a 12.5 pack-year smoking history. She has never used smokeless tobacco. She reports that she does not drink alcohol or use illicit drugs.  ROS:  She has had regular eye exams  Hypertension: home blood pressure  readings are higher recently, about 141-168; currently on Bystolic, Benicar and Lasix  Hypercholesterolemia: On Lipitor, better compliance now and LDL is below 100  Lab Results  Component Value Date   CHOL 166 07/02/2013   HDL 60.80 07/02/2013   LDLCALC 89 07/02/2013   LDLDIRECT 146.1 05/06/2013   TRIG 80.0 07/02/2013   CHOLHDL 3 07/02/2013   No recent numbness in feet.  Edema well controlled with Lasix 40 mg   Examination:   BP 132/88  Pulse 79  Temp(Src) 98.2 F (36.8 C)  Resp 16  Ht 5\' 1"  (1.549 m)  Wt 249 lb 9.6 oz (113.218 kg)  BMI 47.19 kg/m2  SpO2 93%  Body mass index is 47.19 kg/(m^2).   Repeat blood pressure: 128/76  No ankle edema  Assesment:   Diabetes type 2, uncontrolled    Her blood sugars have been much more difficult to control over the last few weeks She has been intolerant to Invokana even with low doses However now with increasing her Lantus  significantly and also more mealtime coverage blood sugars are improving Her fructosamine is still significantly high probably since she had very high readings late last month Also benefiting from starting [place] again over the last 3 weeks and she is tolerating this well  HYPERTENSION: Her blood pressure is higher at home but today is fairly good on the second measurement   PLAN:   She must take the Humalog before each meal consistently and emphasized the need for better postprandial control  She will not take any Humalog if she is more than 30 minutes late in taking it after eating  Continue same doses of insulin  If blood sugars consistently over 200 after meals increase the Victoza to 1.8 mg  More readings after supper  Recheck A1c on her next visit  Total visit time including counseling = 25 minutes  Latesha Chesney 09/03/2013, 10:38 AM   Labs:  Appointment on 08/25/2013  Component Date Value Ref Range Status  . Sodium 08/25/2013 135  135 - 145 mEq/L Final  . Potassium 08/25/2013 3.8  3.5 - 5.1 mEq/L  Final  . Chloride 08/25/2013 97  96 - 112 mEq/L Final  . CO2 08/25/2013 30  19 - 32 mEq/L Final  . Glucose, Bld 08/25/2013 180* 70 - 99 mg/dL Final  . BUN 08/25/2013 14  6 - 23 mg/dL Final  . Creatinine, Ser 08/25/2013 1.0  0.4 - 1.2 mg/dL Final  . Calcium 08/25/2013 9.3  8.4 - 10.5 mg/dL Final  . GFR 08/25/2013 75.83  >60.00 mL/min Final  . Fructosamine 08/25/2013 417* <285 umol/L Final   Comment:                            Variations in levels of serum proteins (albumin and immunoglobulins)                          may affect fructosamine results.

## 2013-09-03 NOTE — Patient Instructions (Addendum)
Please check blood sugars at least half the time about 2 hours after any meal and as directed on waking up. Please bring blood sugar monitor to each visit  Always take Humalog before the meal  If getting sugars >200 go up to 1.8 Victoza

## 2013-09-07 ENCOUNTER — Telehealth: Payer: Self-pay | Admitting: Endocrinology

## 2013-09-07 NOTE — Telephone Encounter (Signed)
Need to know exactly what her blood sugars are. She should not feel symptoms unless blood sugars are below 70 at those times

## 2013-09-07 NOTE — Telephone Encounter (Signed)
Pt has been taking the insulin as prescribed but she feels like she is on a ship and her BS levels has been low

## 2013-09-07 NOTE — Telephone Encounter (Signed)
Please see below and advise.

## 2013-09-08 ENCOUNTER — Telehealth: Payer: Self-pay | Admitting: Endocrinology

## 2013-09-08 NOTE — Telephone Encounter (Signed)
Sugars are ok, needs to ask PCP about dizziness

## 2013-09-08 NOTE — Telephone Encounter (Signed)
Please see below and advise.

## 2013-09-08 NOTE — Telephone Encounter (Signed)
Pt states she was told to call back with her blood sugar readings per rhonda  7:36am 128 10:33am 73 12:33pm 138  Thank You :)

## 2013-09-17 ENCOUNTER — Telehealth: Payer: Self-pay | Admitting: Endocrinology

## 2013-09-17 NOTE — Telephone Encounter (Signed)
Please see below and advise.

## 2013-09-17 NOTE — Telephone Encounter (Signed)
Pt called and stated she had been to her PCP she had her A1C done  A1C 10.2 results   Does Dr. Dwyane Dee want her to do anything?  [phone]  Please Advise   Thank You :)

## 2013-09-17 NOTE — Telephone Encounter (Signed)
Noted, patient is aware. 

## 2013-09-17 NOTE — Telephone Encounter (Signed)
This is a three-month average, as long as her blood sugars are improving no need to do anything at present

## 2013-10-12 ENCOUNTER — Other Ambulatory Visit: Payer: Self-pay | Admitting: *Deleted

## 2013-10-12 MED ORDER — INSULIN LISPRO 100 UNIT/ML (KWIKPEN): PEN_INJECTOR | SUBCUTANEOUS | Status: DC

## 2013-11-03 ENCOUNTER — Other Ambulatory Visit (INDEPENDENT_AMBULATORY_CARE_PROVIDER_SITE_OTHER): Payer: Commercial Managed Care - HMO

## 2013-11-03 DIAGNOSIS — E1165 Type 2 diabetes mellitus with hyperglycemia: Principal | ICD-10-CM

## 2013-11-03 DIAGNOSIS — IMO0001 Reserved for inherently not codable concepts without codable children: Secondary | ICD-10-CM

## 2013-11-03 LAB — COMPREHENSIVE METABOLIC PANEL
ALT: 25 U/L (ref 0–35)
AST: 24 U/L (ref 0–37)
Albumin: 3.8 g/dL (ref 3.5–5.2)
Alkaline Phosphatase: 99 U/L (ref 39–117)
BUN: 13 mg/dL (ref 6–23)
CO2: 27 mEq/L (ref 19–32)
Calcium: 9 mg/dL (ref 8.4–10.5)
Chloride: 102 mEq/L (ref 96–112)
Creatinine, Ser: 0.9 mg/dL (ref 0.4–1.2)
GFR: 78.65 mL/min (ref >60.00)
Glucose, Bld: 131 mg/dL — ABNORMAL HIGH (ref 70–99)
Potassium: 4.1 mEq/L (ref 3.5–5.1)
Sodium: 138 mEq/L (ref 135–145)
Total Bilirubin: 0.3 mg/dL (ref 0.2–1.2)
Total Protein: 7.5 g/dL (ref 6.0–8.3)

## 2013-11-03 LAB — HEMOGLOBIN A1C: Hgb A1c MFr Bld: 8.4 % — ABNORMAL HIGH (ref 4.6–6.5)

## 2013-11-03 LAB — MICROALBUMIN / CREATININE URINE RATIO
Creatinine,U: 139 mg/dL
Microalb Creat Ratio: 0.7 mg/g (ref 0.0–30.0)
Microalb, Ur: 1 mg/dL (ref 0.0–1.9)

## 2013-11-05 ENCOUNTER — Other Ambulatory Visit: Payer: Self-pay | Admitting: *Deleted

## 2013-11-05 ENCOUNTER — Encounter: Payer: Self-pay | Admitting: Endocrinology

## 2013-11-05 ENCOUNTER — Ambulatory Visit (INDEPENDENT_AMBULATORY_CARE_PROVIDER_SITE_OTHER): Payer: Commercial Managed Care - HMO | Admitting: Endocrinology

## 2013-11-05 VITALS — BP 126/82 | HR 71 | Temp 97.7°F | Resp 16 | Ht 61.0 in | Wt 257.8 lb

## 2013-11-05 DIAGNOSIS — IMO0001 Reserved for inherently not codable concepts without codable children: Secondary | ICD-10-CM

## 2013-11-05 DIAGNOSIS — I10 Essential (primary) hypertension: Secondary | ICD-10-CM

## 2013-11-05 DIAGNOSIS — E1165 Type 2 diabetes mellitus with hyperglycemia: Principal | ICD-10-CM

## 2013-11-05 DIAGNOSIS — E669 Obesity, unspecified: Secondary | ICD-10-CM

## 2013-11-05 MED ORDER — INSULIN LISPRO 100 UNIT/ML (KWIKPEN): PEN_INJECTOR | SUBCUTANEOUS | Status: DC

## 2013-11-05 MED ORDER — INSULIN GLARGINE 100 UNIT/ML SOLOSTAR PEN: 38.0000 [IU] | PEN_INJECTOR | Freq: Every day | SUBCUTANEOUS | Status: DC

## 2013-11-05 NOTE — Patient Instructions (Signed)
Lantus 38 units AT SUPPER not in am  Reduce fried food and fast food  May reduce Humalog to 4-6 if low Carb meal

## 2013-11-05 NOTE — Progress Notes (Signed)
Patient ID: [name], female   DOB: 12/07/1945, 68 y.o.   MRN: [id number]   Reason for Appointment: Diabetes follow-up   History of Present Illness   Diagnosis: Type 2 DIABETES MELITUS, date of diagnosis: 2008       PAST history: She has been on insulin for a few years Her blood sugars had initially responded very well to adding Victoza to her basal insulin in 2/13. She had lost weight and was able to comply with diet consistently. A1c had gone down to 7.2%. Also was needing less Levemir insulin .  However blood sugars will be higher if she goes off her diet  With good compliance her A1c was near normal at 6.3 in 5/14  RECENT HISTORY:  Her blood sugars have been more difficult to control and A1c is consistently over 8% ; apparently was over 10% with PCP in March She has been consistent with Victoza but her weight has gone up Blood sugars are quite variable and mostly high in the morning overall compared to evening readings This is despite covering all meals with Humalog  Current blood sugar trends/problems:  Fasting blood sugars are mostly high with an average of about 157 and she thinks sometimes this could be related to late night snacks including ice cream  Blood sugars in the last week or so have been periodically low midday or before supper and not clear why  She also has had problems with inconsistent diet and eating fast food which may be causing some high readings also  Weight is going up despite her exercising more regularly Her A1c was last checked in 06/2013  She was seen by nurse educator in early 2015 and advised to have balanced meals with more protein as well avoid all regular soft drinks   Insulin regimen: Lantus 40 units in am; Humalog 01-31-11 units      Oral hypoglycemic drugs: None  Side effects from medications:  diarrhea from metformin            Proper timing of medications in relation to meals: Yes.    Monitors blood glucose:  twice a day.     Glucometer:  Accu-Chek   PREMEAL Breakfast Lunch Dinner Bedtime Overall  Glucose range: 115-250  66, 71   45-165   59-164    Mean/median:  157     120   131    Hypoglycemia: As above, mostly in the last week Meals: 3 meals per day. Supper 6-9 pm, Lunch is [place]witch, some ice cream   at night  Physical activity: walking 1 mile[facility]            Meals: 3 meals per day,  Dietician visit: Most recent: 6/13.   Wt Readings from Last 3 Encounters:  11/05/13 257 lb 12.8 oz (116.937 kg)  09/03/13 249 lb 9.6 oz (113.218 kg)  07/07/13 245 lb 1.6 oz (111.177 kg)   Lab Results  Component Value Date   HGBA1C 8.4* 11/03/2013   HGBA1C 8.2* 07/02/2013   HGBA1C 8.1* 04/06/2013   Lab Results  Component Value Date   MICROALBUR 1.0 11/03/2013   LDLCALC 89 07/02/2013   CREATININE 0.9 11/03/2013       Medication List       This list is accurate as of: 11/05/13 11:05 AM.  Always use your most recent med list.               accu-chek multiclix lancets  Use 3 times  per day     albuterol 108 (90 BASE) MCG/ACT inhaler  Commonly known as:  PROVENTIL HFA;VENTOLIN HFA  Inhale 2 puffs into the lungs every 6 (six) hours as needed for wheezing.     ARIPiprazole 10 MG tablet  Commonly known as:  ABILIFY  Take 10 mg by mouth daily.     aspirin EC 81 MG tablet  Take 1 tablet (81 mg total) by mouth at bedtime.     atorvastatin 40 MG tablet  Commonly known as:  LIPITOR  Take 1 tablet (40 mg total) by mouth every evening.     B-D SINGLE USE SWABS REGULAR Pads     carbidopa-levodopa 25-100 MG per tablet  Commonly known as:  SINEMET IR     ferrous gluconate 324 MG tablet  Commonly known as:  FERGON  Take 324 mg by mouth every evening.     furosemide 40 MG tablet  Commonly known as:  LASIX  Take 40 mg by mouth every evening.     glucose blood test strip  Commonly known as:  ACCU-CHEK SMARTVIEW  accu-chek smart view test strips, use to check blood sugars 2 times per day DAW, dx code 250      HYDROcodone-acetaminophen 5-325 MG per tablet  Commonly known as:  NORCO/VICODIN     insulin glargine 100 UNIT/ML injection  Commonly known as:  LANTUS  Inject 40 Units into the skin daily.     insulin lispro 100 UNIT/ML KiwkPen  Commonly known as:  HUMALOG  Inject 8 units at breakfast 8 units at lunch and 12 units at supper     isosorbide dinitrate 30 MG tablet  Commonly known as:  ISORDIL  Take 30 mg by mouth every morning.     isosorbide mononitrate 30 MG 24 hr tablet  Commonly known as:  IMDUR     LAMICTAL 100 MG tablet  Generic drug:  lamoTRIgine  Take 100-150 mg by mouth 2 (two) times daily. 1 tab in am, 1.5 tab in pm     Liraglutide 18 MG/3ML Sopn  Commonly known as:  VICTOZA  Inject 1.2 mg into the skin daily.     meloxicam 15 MG tablet  Commonly known as:  MOBIC  Take 15 mg by mouth daily.     nebivolol 10 MG tablet  Commonly known as:  BYSTOLIC  Take 10 mg by mouth daily.     olmesartan 40 MG tablet  Commonly known as:  BENICAR  Take 40 mg by mouth daily.     omega-3 acid ethyl esters 1 G capsule  Commonly known as:  LOVAZA  Take 1 g by mouth daily.     Pancrelipase (Lip-Prot-Amyl) 25000 UNITS Cpep  Take 2 capsules by mouth 3 (three) times daily.     potassium chloride SA 20 MEQ tablet  Commonly known as:  K-DUR,KLOR-CON  Take 20 mEq by mouth daily.     sertraline 100 MG tablet  Commonly known as:  ZOLOFT  Take 100 mg by mouth daily.     traMADol 50 MG tablet  Commonly known as:  ULTRAM  Take 50 mg by mouth every 6 (six) hours as needed for pain.        Allergies:  Allergies  Allergen Reactions  . Codeine     Headache and "just feel off"  . Penicillins Hives  . Sulfa Antibiotics Hives  . Other Rash    Bleach    Past Medical History  Diagnosis Date  . Myocardial  infarction   . Respiratory failure      multifactorial secondary to congestive heart failure, morbid obesity, decreased abdominal  compliance in the setting of  pancreatitis.  . Diastolic heart failure   . Leukocytosis     secondary to steroid and pancreatitis.  . DM (diabetes mellitus)   . HTN (hypertension)   . Seizure disorder   . Hyperlipidemia   . Back pain   . Obstructive sleep apnea   . Empty sella     on MRI in 2009.  . Diverticulitis   . Abnormal liver function      in the past.  . Headache(784.0)   . Pneumonia   . Stroke   . Seizures   . Pancreatitis   . Family history of anesthesia complication     History of a seizure  . Anemia   . Depression     Past Surgical History  Procedure Laterality Date  . Cholecystectomy    . Abdominal hysterectomy    . Tubal ligation    . Tonsillectomy    . Appendectomy    . Cardiac catheterization      jan 2012  . Rectal polypectomy    . Eye cysts    . Back surgery    . Colonoscopy N/A 08/15/2012    Procedure: COLONOSCOPY;  Surgeon: Arta Silence, MD;  Location: [facility];  Service: Endoscopy;  Laterality: N/A;    Family History  Problem Relation Age of Onset  . Allergies Father   . Heart disease Father     before 31  . Heart failure Father   . Hypertension Father   . Hyperlipidemia Father   . Heart disease Mother   . Diabetes Mother   . Hypertension Mother   . Hyperlipidemia Mother   . Hypertension Sister   . Heart disease Sister     before 12  . Hyperlipidemia Sister   . Cancer Other     Social History:  reports that she quit smoking about 31 years ago. Her smoking use included Cigarettes. She has a 12.5 pack-year smoking history. She has never used smokeless tobacco. She reports that she does not drink alcohol or use illicit drugs.  ROS:  She has had regular eye exams  Hypertension: home blood pressure readings are higher recently, about 141-168; currently on Bystolic, Benicar and Lasix  Hypercholesterolemia: On Lipitor, better compliance now and LDL is below 100  Lab Results  Component Value Date   CHOL 166 07/02/2013   HDL 60.80 07/02/2013   LDLCALC 89  07/02/2013   LDLDIRECT 146.1 05/06/2013   TRIG 80.0 07/02/2013   CHOLHDL 3 07/02/2013   No recent numbness in feet.  Edema well controlled with Lasix 40 mg  Was having dizziness, less recently   Examination:   BP 126/82  Pulse 71  Temp(Src) 97.7 F (36.5 C)  Resp 16  Ht 5\' 1"  (1.549 m)  Wt 257 lb 12.8 oz (116.937 kg)  BMI 48.74 kg/m2  SpO2 97%  Body mass index is 48.74 kg/(m^2).   Foot exam done today No ankle edema  Assesment:   Diabetes type 2, uncontrolled    Her blood sugars have been overall better but her A1c is still high See history of present illness for detailed discussion of current blood sugar patterns, problems identified and current management Most of her high readings appear to be fasting with the highest reading 250; this is mostly from late night snacks despite using Victoza Weight has gone up despite  continuing Victoza 1.2 mg Some of the weight gain may be related to recent improved blood sugars and occasional hypoglycemia during the day She has been still inconsistent with diet especially late night snacks and sometimes eating fast food  HYPERTENSION: Her blood pressure is better controlled   PLAN:   She needs to do better with her diet and this was discussed  She will try to use the Lantus in the evening instead of morning for better fasting blood sugar control and avoiding low readings in the afternoons. She will reduce the dose to 38 units and at least 2 units less if continuing to have low readings. She will call if she has any inconsistent blood sugars  Consider increasing Victoza the 1.8 mg   Elayne Snare 11/05/2013, 11:05 AM   Labs:  Appointment on 11/03/2013  Component Date Value Ref Range Status  . Hemoglobin A1C 11/03/2013 8.4* 4.6 - 6.5 % Final   Glycemic Control Guidelines for People with Diabetes:Non Diabetic:  <6%Goal of Therapy: <7%Additional Action Suggested:  >8%   . Sodium 11/03/2013 138  135 - 145 mEq/L Final  . Potassium  11/03/2013 4.1  3.5 - 5.1 mEq/L Final  . Chloride 11/03/2013 102  96 - 112 mEq/L Final  . CO2 11/03/2013 27  19 - 32 mEq/L Final  . Glucose, Bld 11/03/2013 131* 70 - 99 mg/dL Final  . BUN 11/03/2013 13  6 - 23 mg/dL Final  . Creatinine, Ser 11/03/2013 0.9  0.4 - 1.2 mg/dL Final  . Total Bilirubin 11/03/2013 0.3  0.2 - 1.2 mg/dL Final  . Alkaline Phosphatase 11/03/2013 99  39 - 117 U/L Final  . AST 11/03/2013 24  0 - 37 U/L Final  . ALT 11/03/2013 25  0 - 35 U/L Final  . Total Protein 11/03/2013 7.5  6.0 - 8.3 g/dL Final  . Albumin 11/03/2013 3.8  3.5 - 5.2 g/dL Final  . Calcium 11/03/2013 9.0  8.4 - 10.5 mg/dL Final  . GFR 11/03/2013 78.65  >60.00 mL/min Final  . Microalb, Ur 11/03/2013 1.0  0.0 - 1.9 mg/dL Final  . Creatinine,U 11/03/2013 139.0   Final  . Microalb Creat Ratio 11/03/2013 0.7  0.0 - 30.0 mg/g Final

## 2013-11-09 ENCOUNTER — Telehealth: Payer: Self-pay | Admitting: Endocrinology

## 2013-11-09 ENCOUNTER — Other Ambulatory Visit: Payer: Self-pay | Admitting: *Deleted

## 2013-11-09 MED ORDER — LIRAGLUTIDE 18 MG/3ML ~~LOC~~ SOPN: PEN_INJECTOR | SUBCUTANEOUS | Status: DC

## 2013-11-09 NOTE — Telephone Encounter (Signed)
Corrected note: Please ask the patient to increase the dose up to 1.8 mg, may need new prescription for this dose

## 2013-11-09 NOTE — Telephone Encounter (Signed)
Be still the patient to increase the dose up to 1.8 mg, may need new prescription for this dose

## 2013-11-09 NOTE — Telephone Encounter (Signed)
FYI  Please see below

## 2013-11-09 NOTE — Telephone Encounter (Signed)
Sister calling regarding her condition sister states pt is not doing right and she is eating everything in site and wants to let us know she is not asking her to bake cakes.

## 2013-11-12 ENCOUNTER — Other Ambulatory Visit: Payer: Self-pay | Admitting: *Deleted

## 2013-11-12 ENCOUNTER — Telehealth: Payer: Self-pay | Admitting: Endocrinology

## 2013-11-12 MED ORDER — LIRAGLUTIDE 18 MG/3ML ~~LOC~~ SOPN: PEN_INJECTOR | SUBCUTANEOUS | Status: DC

## 2013-11-12 NOTE — Telephone Encounter (Signed)
rx was sent on 11/09/2013, resent again today

## 2013-11-12 NOTE — Telephone Encounter (Signed)
Patient states she is on victoza and she called drug store to get refill; not sent  [facility] on [address] :)

## 2013-12-01 ENCOUNTER — Other Ambulatory Visit: Payer: Self-pay | Admitting: Endocrinology

## 2014-02-02 ENCOUNTER — Other Ambulatory Visit: Payer: Commercial Managed Care - HMO

## 2014-02-05 ENCOUNTER — Ambulatory Visit: Payer: Commercial Managed Care - HMO | Admitting: Endocrinology

## 2014-02-23 ENCOUNTER — Telehealth: Payer: Self-pay | Admitting: Endocrinology

## 2014-02-23 NOTE — Telephone Encounter (Signed)
Continue taking the normal doses since sugar is okay

## 2014-02-23 NOTE — Telephone Encounter (Signed)
Left message for patient to call back  

## 2014-02-23 NOTE — Telephone Encounter (Signed)
Noted, patient is aware. 

## 2014-02-23 NOTE — Telephone Encounter (Signed)
Patient took her insulin last night,  instead of taking her lantus she injected 38 units of  Humalog  Her sugar this morning was 161, then increased to 171.  She wants to know how much insulin to take now, she did say she took 8 units of humalog this morning.  Please advise  Patient keeps changing appts because she does not have her copay to come in.

## 2014-02-23 NOTE — Telephone Encounter (Signed)
Patient is had trouble with her insulin last night she took it wrong, she is holding off taking it until she hear from you.

## 2014-03-26 ENCOUNTER — Other Ambulatory Visit: Payer: Commercial Managed Care - HMO

## 2014-03-31 ENCOUNTER — Ambulatory Visit: Payer: Commercial Managed Care - HMO | Admitting: Endocrinology

## 2014-04-05 ENCOUNTER — Other Ambulatory Visit: Payer: Commercial Managed Care - HMO

## 2014-04-05 ENCOUNTER — Ambulatory Visit: Payer: Commercial Managed Care - HMO | Admitting: Endocrinology

## 2014-04-06 ENCOUNTER — Other Ambulatory Visit (INDEPENDENT_AMBULATORY_CARE_PROVIDER_SITE_OTHER): Payer: Commercial Managed Care - HMO

## 2014-04-06 DIAGNOSIS — IMO0002 Reserved for concepts with insufficient information to code with codable children: Secondary | ICD-10-CM

## 2014-04-06 DIAGNOSIS — E1165 Type 2 diabetes mellitus with hyperglycemia: Secondary | ICD-10-CM

## 2014-04-06 LAB — BASIC METABOLIC PANEL
BUN: 15 mg/dL (ref 6–23)
CO2: 30 mEq/L (ref 19–32)
Calcium: 8.9 mg/dL (ref 8.4–10.5)
Chloride: 97 mEq/L (ref 96–112)
Creatinine, Ser: 1 mg/dL (ref 0.4–1.2)
GFR: 69.73 mL/min (ref >60.00)
Glucose, Bld: 132 mg/dL — ABNORMAL HIGH (ref 70–99)
Potassium: 3.6 mEq/L (ref 3.5–5.1)
Sodium: 135 mEq/L (ref 135–145)

## 2014-04-06 LAB — HEMOGLOBIN A1C: Hgb A1c MFr Bld: 7.4 % — ABNORMAL HIGH (ref 4.6–6.5)

## 2014-04-09 ENCOUNTER — Encounter: Payer: Self-pay | Admitting: Endocrinology

## 2014-04-09 ENCOUNTER — Ambulatory Visit (INDEPENDENT_AMBULATORY_CARE_PROVIDER_SITE_OTHER): Payer: Commercial Managed Care - HMO | Admitting: Endocrinology

## 2014-04-09 VITALS — BP 134/82 | HR 77 | Temp 98.0°F | Resp 16 | Ht 61.0 in | Wt 269.0 lb

## 2014-04-09 DIAGNOSIS — I10 Essential (primary) hypertension: Secondary | ICD-10-CM

## 2014-04-09 DIAGNOSIS — E785 Hyperlipidemia, unspecified: Secondary | ICD-10-CM

## 2014-04-09 DIAGNOSIS — E1165 Type 2 diabetes mellitus with hyperglycemia: Secondary | ICD-10-CM

## 2014-04-09 DIAGNOSIS — IMO0002 Reserved for concepts with insufficient information to code with codable children: Secondary | ICD-10-CM

## 2014-04-09 NOTE — Progress Notes (Signed)
Patient ID: [name], female   DOB: 1946/01/14, 68 y.o.   MRN: [id number]   Reason for Appointment: Diabetes follow-up   History of Present Illness   Diagnosis: Type 2 DIABETES MELITUS, date of diagnosis: 2008       PAST history: She has been on insulin for a few years Her blood sugars had initially responded very well to adding Victoza to her basal insulin in 2/13. She had lost weight and was able to comply with diet consistently. A1c had gone down to 7.2%. Also was needing less Levemir insulin .  However blood sugars will be higher if she goes off her diet  With good compliance her A1c was near normal at 6.3 in 5/14  RECENT HISTORY:  More recently her blood sugars have been poorly controlled with A1c as high as over 10% in 3/15 this is primarily from poor diet and weight gain She has not been seen in followup since 5/15 However she has tried to start a regular exercise program and A1c is now below 8%;  this was better than 8.4 previously She has gained weight from inconsistent diet and snacking excessively She also has done somewhat better with compliance on covering all her meals with Humalog Her blood sugars have been more difficult to control and A1c is consistently over 8% ; apparently was over 10% with PCP in March She has been consistent with Victoza 1.8 but her weight has gone up She did not bring her blood sugar monitor for download and not clear how consistent she is monitoring or the overall picture Appears to not be checking her readings nonfasting any more  Hypoglycemia: once 52 with symptoms at 2 pm  She was seen by nurse educator in early 2015 and advised to have balanced meals with more protein as well avoid all regular soft drinks She can however do better with these measures   Insulin regimen: Lantus 40 units hs; Humalog 01-31-11 units      Oral hypoglycemic drugs: None  Side effects from medications:  diarrhea from metformin            Proper timing of  medications in relation to meals: Yes.    Monitors blood glucose:  once a day.    Glucometer:  Accu-Chek   readings from recall: Am 140 today; acs 68-78 occ; ? Hs  Meals: 3 meals per day. Supper 6-9 pm, Lunch is sandwich Physical activity: Bike 2-3/7           Meals: 3 meals per day,  Dietician visit: Most recent: 6/13.   Wt Readings from Last 3 Encounters:  04/09/14 269 lb (122.018 kg)  11/05/13 257 lb 12.8 oz (116.937 kg)  09/03/13 249 lb 9.6 oz (113.218 kg)   Lab Results  Component Value Date   HGBA1C 7.4* 04/06/2014   HGBA1C 8.4* 11/03/2013   HGBA1C 8.2* 07/02/2013   Lab Results  Component Value Date   MICROALBUR 1.0 11/03/2013   LDLCALC 89 07/02/2013   CREATININE 1.0 04/06/2014       Medication List       This list is accurate as of: 04/09/14  1:45 PM.  Always use your most recent med list.               accu-chek multiclix lancets  Use 3 times per day     albuterol 108 (90 BASE) MCG/ACT inhaler  Commonly known as:  PROVENTIL HFA;VENTOLIN HFA  Inhale 2 puffs into the lungs every 6 (six)  hours as needed for wheezing.     ARIPiprazole 10 MG tablet  Commonly known as:  ABILIFY  Take 10 mg by mouth daily.     aspirin EC 81 MG tablet  Take 1 tablet (81 mg total) by mouth at bedtime.     atorvastatin 40 MG tablet  Commonly known as:  LIPITOR  TAKE ONE TABLET BY MOUTH IN THE EVENING     B-D SINGLE USE SWABS REGULAR Pads     carbidopa-levodopa 25-100 MG per tablet  Commonly known as:  SINEMET IR     ferrous gluconate 324 MG tablet  Commonly known as:  FERGON  Take 324 mg by mouth every evening.     furosemide 40 MG tablet  Commonly known as:  LASIX  Take 40 mg by mouth every evening.     glucose blood test strip  Commonly known as:  ACCU-CHEK SMARTVIEW  accu-chek smart view test strips, use to check blood sugars 2 times per day DAW, dx code 250     HYDROcodone-acetaminophen 5-325 MG per tablet  Commonly known as:  NORCO/VICODIN     Insulin Glargine  100 UNIT/ML Solostar Pen  Commonly known as:  LANTUS SOLOSTAR  Inject 38 Units into the skin daily at 10 pm.     insulin lispro 100 UNIT/ML KiwkPen  Commonly known as:  HUMALOG  Inject 8 units at breakfast 8 units at lunch and 12 units at supper     isosorbide dinitrate 30 MG tablet  Commonly known as:  ISORDIL  Take 30 mg by mouth every morning.     isosorbide mononitrate 30 MG 24 hr tablet  Commonly known as:  IMDUR     LAMICTAL 100 MG tablet  Generic drug:  lamoTRIgine  Take 100-150 mg by mouth 2 (two) times daily. 1 tab in am, 1.5 tab in pm     Liraglutide 18 MG/3ML Sopn  Commonly known as:  VICTOZA  Inject 1.8 daily     meloxicam 15 MG tablet  Commonly known as:  MOBIC  Take 15 mg by mouth daily.     nebivolol 10 MG tablet  Commonly known as:  BYSTOLIC  Take 10 mg by mouth daily.     olmesartan 40 MG tablet  Commonly known as:  BENICAR  Take 40 mg by mouth daily.     omega-3 acid ethyl esters 1 G capsule  Commonly known as:  LOVAZA  Take 1 g by mouth daily.     Pancrelipase (Lip-Prot-Amyl) 25000 UNITS Cpep  Take 2 capsules by mouth 3 (three) times daily.     potassium chloride SA 20 MEQ tablet  Commonly known as:  K-DUR,KLOR-CON  Take 20 mEq by mouth daily.     sertraline 100 MG tablet  Commonly known as:  ZOLOFT  Take 100 mg by mouth daily.     traMADol 50 MG tablet  Commonly known as:  ULTRAM  Take 50 mg by mouth every 6 (six) hours as needed for pain.        Allergies:  Allergies  Allergen Reactions  . Codeine     Headache and "just feel off"  . Penicillins Hives  . Sulfa Antibiotics Hives  . Other Rash    Bleach    Past Medical History  Diagnosis Date  . Myocardial infarction   . Respiratory failure      multifactorial secondary to congestive heart failure, morbid obesity, decreased abdominal  compliance in the setting of pancreatitis.  . Diastolic  heart failure   . Leukocytosis     secondary to steroid and pancreatitis.  . DM  (diabetes mellitus)   . HTN (hypertension)   . Seizure disorder   . Hyperlipidemia   . Back pain   . Obstructive sleep apnea   . Empty sella     on MRI in 2009.  . Diverticulitis   . Abnormal liver function      in the past.  . Headache(784.0)   . Pneumonia   . Stroke   . Seizures   . Pancreatitis   . Family history of anesthesia complication     History of a seizure  . Anemia   . Depression     Past Surgical History  Procedure Laterality Date  . Cholecystectomy    . Abdominal hysterectomy    . Tubal ligation    . Tonsillectomy    . Appendectomy    . Cardiac catheterization      jan 2012  . Rectal polypectomy    . Eye cysts    . Back surgery    . Colonoscopy N/A 08/15/2012    Procedure: COLONOSCOPY;  Surgeon: Arta Silence, MD;  Location: [facility];  Service: Endoscopy;  Laterality: N/A;    Family History  Problem Relation Age of Onset  . Allergies Father   . Heart disease Father     before 70  . Heart failure Father   . Hypertension Father   . Hyperlipidemia Father   . Heart disease Mother   . Diabetes Mother   . Hypertension Mother   . Hyperlipidemia Mother   . Hypertension Sister   . Heart disease Sister     before 18  . Hyperlipidemia Sister   . Cancer Other     Social History:  reports that she quit smoking about 32 years ago. Her smoking use included Cigarettes. She has a 12.5 pack-year smoking history. She has never used smokeless tobacco. She reports that she does not drink alcohol or use illicit drugs.  ROS:  She has had regular eye exams  Hypertension: currently on Bystolic, Benicar and Lasix and control appears better  Hypercholesterolemia: On Lipitor, better compliance now and LDL is below 100 Last LDL was 62 in 3/15 from PCP  Lab Results  Component Value Date   CHOL 166 07/02/2013   HDL 60.80 07/02/2013   LDLCALC 89 07/02/2013   LDLDIRECT 146.1 05/06/2013   TRIG 80.0 07/02/2013   CHOLHDL 3 07/02/2013   No recent numbness in  feet. Foot exam done in 5/15  Edema well controlled with Lasix 40 mg    Examination:   BP 134/82  Pulse 77  Temp(Src) 98 F (36.7 C)  Resp 16  Ht 5\' 1"  (1.549 m)  Wt 269 lb (122.018 kg)  BMI 50.85 kg/m2  SpO2 95%  Body mass index is 50.85 kg/(m^2).   No ankle edema  Assesment:   Diabetes type 2, uncontrolled    Her blood sugars have been overall better but her A1c is still over 7% See history of present illness for detailed discussion of current blood sugar patterns, problems identified and current management Difficult to assess her blood sugar readings as she is not monitoring after meals and only in the morning Also did not bring her monitor for download Fasting glucose is probably fairly good and lab glucose was 132 Despite her exercise she has gained 12 pounds in the last 5 months probably from inconsistent diet, not clear if this is slowing down  This is despite increasing her dose to 1.8 mg  HYPERTENSION: Her blood pressure is better controlled   LIPIDS: Not clear she has had these checked from PCP, to get records  PLAN:   She needs to do better with her diet with modification of types of foods and snacks and portions and reduction of portions at bedtime at times next  She will try check blood sugars more consistently after meals as discussed, discussed blood sugar targets  May need to adjust her Humalog based on glucose readings   Consider reducing Lantus if morning sugars are improving by next visit   Jamielynn Wigley 04/09/2014, 1:45 PM   Labs:  Appointment on 04/06/2014  Component Date Value Ref Range Status  . Hemoglobin A1C 04/06/2014 7.4* 4.6 - 6.5 % Final   Glycemic Control Guidelines for People with Diabetes:Non Diabetic:  <6%Goal of Therapy: <7%Additional Action Suggested:  >8%   . Sodium 04/06/2014 135  135 - 145 mEq/L Final  . Potassium 04/06/2014 3.6  3.5 - 5.1 mEq/L Final  . Chloride 04/06/2014 97  96 - 112 mEq/L Final  . CO2 04/06/2014 30  19  - 32 mEq/L Final  . Glucose, Bld 04/06/2014 132* 70 - 99 mg/dL Final  . BUN 04/06/2014 15  6 - 23 mg/dL Final  . Creatinine, Ser 04/06/2014 1.0  0.4 - 1.2 mg/dL Final  . Calcium 04/06/2014 8.9  8.4 - 10.5 mg/dL Final  . GFR 04/06/2014 69.73  >60.00 mL/min Final

## 2014-04-09 NOTE — Patient Instructions (Addendum)
Please check blood sugars at least half the time about 2 hours after any meal and 3 times per week on waking up.  Please bring blood sugar monitor to each visit  Smaller portions and carbs

## 2014-04-20 ENCOUNTER — Other Ambulatory Visit: Payer: Self-pay | Admitting: Endocrinology

## 2014-04-29 ENCOUNTER — Ambulatory Visit: Payer: Medicare HMO | Admitting: Podiatrist

## 2014-05-04 ENCOUNTER — Telehealth: Payer: Self-pay | Admitting: Endocrinology

## 2014-05-04 NOTE — Telephone Encounter (Signed)
pts care manager is calling regarding confusion about diabetic supplies they are trying to get her for free form [employer]  An auth form has already be faxed to [place] twice, if you still have it please fax it thank you

## 2014-05-07 ENCOUNTER — Ambulatory Visit: Payer: Medicare HMO | Admitting: Podiatrist

## 2014-05-13 ENCOUNTER — Other Ambulatory Visit: Payer: Self-pay | Admitting: Endocrinology

## 2014-05-25 ENCOUNTER — Other Ambulatory Visit: Payer: Self-pay | Admitting: Neurosurgery

## 2014-05-25 DIAGNOSIS — M5416 Radiculopathy, lumbar region: Secondary | ICD-10-CM

## 2014-05-31 ENCOUNTER — Ambulatory Visit
Admission: RE | Admit: 2014-05-31 | Discharge: 2014-05-31 | Disposition: A | Discharge status: Home / Self Care | Payer: Commercial Managed Care - HMO | Source: Ambulatory Visit | Attending: Neurosurgery | Admitting: Neurosurgery

## 2014-05-31 DIAGNOSIS — M5416 Radiculopathy, lumbar region: Secondary | ICD-10-CM

## 2014-06-02 ENCOUNTER — Other Ambulatory Visit: Payer: Self-pay | Admitting: *Deleted

## 2014-06-02 MED ORDER — GLUCOSE BLOOD VI STRP: ORAL_STRIP | Status: DC

## 2014-06-07 ENCOUNTER — Telehealth: Payer: Self-pay | Admitting: Endocrinology

## 2014-06-07 NOTE — Telephone Encounter (Signed)
Pt calling regarding dropping blood sugars last night down to 41, this has been happening more than a month with many blood sugar drops

## 2014-06-09 NOTE — Telephone Encounter (Signed)
[email or web address]

## 2014-06-10 LAB — BASIC METABOLIC PANEL: Creatinine: 1 mg/dL (ref <=1.1)

## 2014-06-10 LAB — HEMOGLOBIN A1C: Hgb A1c MFr Bld: 7.1 % — AB (ref 4.0–6.0)

## 2014-06-10 LAB — TSH: TSH: 2.13 u[IU]/mL (ref <=5.90)

## 2014-06-11 ENCOUNTER — Telehealth: Payer: Self-pay | Admitting: Endocrinology

## 2014-06-11 NOTE — Telephone Encounter (Signed)
12/16 at 816 am 131; at 715 pm 94 06/10/14 802 AM its 81; not done in PM 06/11/14 827 AM 65; at 931 AM 87; at 1131 PM 53; 208 PM 109

## 2014-06-11 NOTE — Telephone Encounter (Signed)
Please see below, patients blood sugars for the last 3 days

## 2014-06-11 NOTE — Telephone Encounter (Signed)
Reduce Lantus dose by 4 units and Humalog by 2 units at each meal, please confirm what doses she is taking

## 2014-06-11 NOTE — Telephone Encounter (Signed)
Patient states that her blood sugars are still dropping and she would like to know what to do    Please advise patient    Thank you

## 2014-06-11 NOTE — Telephone Encounter (Signed)
Noted, left message on patients vm

## 2014-06-24 ENCOUNTER — Other Ambulatory Visit: Payer: Self-pay | Admitting: Endocrinology

## 2014-06-28 DIAGNOSIS — Z6841 Body Mass Index (BMI) 40.0 and over, adult: Secondary | ICD-10-CM | POA: Diagnosis not present

## 2014-06-28 DIAGNOSIS — M5136 Other intervertebral disc degeneration, lumbar region: Secondary | ICD-10-CM | POA: Diagnosis not present

## 2014-06-28 DIAGNOSIS — R030 Elevated blood-pressure reading, without diagnosis of hypertension: Secondary | ICD-10-CM | POA: Diagnosis not present

## 2014-06-28 DIAGNOSIS — M5416 Radiculopathy, lumbar region: Secondary | ICD-10-CM | POA: Diagnosis not present

## 2014-06-30 DIAGNOSIS — M5416 Radiculopathy, lumbar region: Secondary | ICD-10-CM | POA: Diagnosis not present

## 2014-07-02 ENCOUNTER — Encounter: Payer: Self-pay | Admitting: Endocrinology

## 2014-07-02 ENCOUNTER — Encounter: Payer: Self-pay | Admitting: *Deleted

## 2014-07-05 ENCOUNTER — Other Ambulatory Visit: Payer: Commercial Managed Care - HMO

## 2014-07-05 ENCOUNTER — Other Ambulatory Visit (INDEPENDENT_AMBULATORY_CARE_PROVIDER_SITE_OTHER): Payer: Commercial Managed Care - HMO

## 2014-07-05 DIAGNOSIS — E1165 Type 2 diabetes mellitus with hyperglycemia: Secondary | ICD-10-CM

## 2014-07-05 DIAGNOSIS — IMO0002 Reserved for concepts with insufficient information to code with codable children: Secondary | ICD-10-CM

## 2014-07-05 LAB — COMPREHENSIVE METABOLIC PANEL
ALT: 15 U/L (ref 0–35)
AST: 14 U/L (ref 0–37)
Albumin: 3.6 g/dL (ref 3.5–5.2)
Alkaline Phosphatase: 92 U/L (ref 39–117)
BUN: 10 mg/dL (ref 6–23)
CO2: 30 mEq/L (ref 19–32)
Calcium: 8.8 mg/dL (ref 8.4–10.5)
Chloride: 102 mEq/L (ref 96–112)
Creatinine, Ser: 0.8 mg/dL (ref 0.4–1.2)
GFR: 92.22 mL/min (ref >60.00)
Glucose, Bld: 103 mg/dL — ABNORMAL HIGH (ref 70–99)
Potassium: 4.2 mEq/L (ref 3.5–5.1)
Sodium: 138 mEq/L (ref 135–145)
Total Bilirubin: 0.5 mg/dL (ref 0.2–1.2)
Total Protein: 7.7 g/dL (ref 6.0–8.3)

## 2014-07-05 LAB — LIPID PANEL
Cholesterol: 190 mg/dL (ref 0–200)
HDL: 56.8 mg/dL (ref >39.00)
LDL Cholesterol: 115 mg/dL — ABNORMAL HIGH (ref 0–99)
NonHDL: 133.2
Total CHOL/HDL Ratio: 3
Triglycerides: 93 mg/dL (ref 0.0–149.0)
VLDL: 18.6 mg/dL (ref 0.0–40.0)

## 2014-07-05 LAB — MICROALBUMIN / CREATININE URINE RATIO
Creatinine,U: 59.7 mg/dL
Microalb Creat Ratio: 5 mg/g (ref 0.0–30.0)
Microalb, Ur: 3 mg/dL — ABNORMAL HIGH (ref 0.0–1.9)

## 2014-07-05 LAB — HEMOGLOBIN A1C: Hgb A1c MFr Bld: 7.3 % — ABNORMAL HIGH (ref 4.6–6.5)

## 2014-07-09 ENCOUNTER — Ambulatory Visit (INDEPENDENT_AMBULATORY_CARE_PROVIDER_SITE_OTHER): Payer: Commercial Managed Care - HMO | Admitting: Endocrinology

## 2014-07-09 ENCOUNTER — Other Ambulatory Visit: Payer: Self-pay | Admitting: *Deleted

## 2014-07-09 ENCOUNTER — Encounter: Payer: Self-pay | Admitting: Endocrinology

## 2014-07-09 VITALS — BP 145/83 | HR 72 | Temp 97.9°F | Resp 16 | Ht 61.0 in | Wt 274.0 lb

## 2014-07-09 DIAGNOSIS — E669 Obesity, unspecified: Secondary | ICD-10-CM | POA: Diagnosis not present

## 2014-07-09 DIAGNOSIS — I10 Essential (primary) hypertension: Secondary | ICD-10-CM | POA: Diagnosis not present

## 2014-07-09 DIAGNOSIS — E1165 Type 2 diabetes mellitus with hyperglycemia: Secondary | ICD-10-CM

## 2014-07-09 DIAGNOSIS — E785 Hyperlipidemia, unspecified: Secondary | ICD-10-CM

## 2014-07-09 DIAGNOSIS — IMO0002 Reserved for concepts with insufficient information to code with codable children: Secondary | ICD-10-CM

## 2014-07-09 NOTE — Patient Instructions (Addendum)
Meloxicam 1/2 tab daily  Lantus 32 units daily and if am sugar stays < 90 then do 30 next week  Please check blood sugars at least half the time about 2 hours after any meal and 3-4 times per week on waking up. Please bring blood sugar monitor to each visit  Make sure taking Lipitor daily

## 2014-07-09 NOTE — Progress Notes (Signed)
Patient ID: [name], female   DOB: June 28, 1945, 69 y.o.   MRN: [id number]   Reason for Appointment: Diabetes follow-up   History of Present Illness   Diagnosis: Type 2 DIABETES MELITUS, date of diagnosis: 2008       PAST history: She has been on insulin for a few years Her blood sugars had initially responded very well to adding Victoza to her basal insulin in 2/13. She had lost weight and was able to comply with diet consistently. A1c had gone down to 7.2%. Also was needing less Levemir insulin .  However blood sugars will be higher if she goes off her diet  With good compliance her A1c was near normal at 6.3 in 5/14  RECENT HISTORY:  More recently her blood sugars have been overall better controlled although A1c is still over 7% in our lab She was starting to get hypoglycemia in early December and her insulin doses were reduced across the board.  Blood sugars were as low as but mostly in the afternoon She appears to be more motivated to work on her weight also Even with reducing her Lantus insulin and her fasting blood sugars have been fairly good and more recently consistently in the 80s No recent hypoglycemia but was getting frequent low sugars in the afternoons in late December However she is recently irregular about checking her sugars after meals and only doing this in the morning She has been consistent with Victoza 1.8 but her weight has gone up Hypoglycemia: None since 12/27 She has been advised on the diet previously. She was seen by nurse educator in early 2015 and advised to have balanced meals with more protein as well avoid all regular soft drinks   Insulin regimen: Lantus 34 units hs; Humalog 6--6-10 before meals      Oral hypoglycemic drugs: None  Side effects from medications:  diarrhea from metformin            Proper timing of medications in relation to meals: Yes.    Monitors blood glucose:  once a day.    Glucometer:  Accu-Chek   PRE-MEAL Breakfast Lunch   4-7 PM  Bedtime Overall  Glucose range:  80-144    51-129   146, 174   51-543   Mean/median:  103      107     Meals: 3 meals per day. Supper 6-9 pm, Lunch is sandwich Physical activity: Y:Bike and machines, 60 min; 3-4//7           Meals: 3 meals per day,  Dietician visit: Most recent: 6/13.   Wt Readings from Last 3 Encounters:  07/09/14 274 lb (124.286 kg)  04/09/14 269 lb (122.018 kg)  11/05/13 257 lb 12.8 oz (116.937 kg)   Lab Results  Component Value Date   HGBA1C 7.3* 07/05/2014   HGBA1C 7.1* 06/10/2014   HGBA1C 7.4* 04/06/2014   Lab Results  Component Value Date   MICROALBUR 3.0* 07/05/2014   LDLCALC 115* 07/05/2014   CREATININE 0.8 07/05/2014       Medication List       This list is accurate as of: 07/09/14  1:45 PM.  Always use your most recent med list.               accu-chek multiclix lancets  Use 3 times per day     albuterol 108 (90 BASE) MCG/ACT inhaler  Commonly known as:  PROVENTIL HFA;VENTOLIN HFA  Inhale 2 puffs into the lungs every 6 (  six) hours as needed for wheezing.     ARIPiprazole 10 MG tablet  Commonly known as:  ABILIFY  Take 10 mg by mouth daily.     aspirin EC 81 MG tablet  Take 1 tablet (81 mg total) by mouth at bedtime.     atorvastatin 40 MG tablet  Commonly known as:  LIPITOR  TAKE ONE TABLET BY MOUTH IN THE EVENING     B-D SINGLE USE SWABS REGULAR Pads     carbidopa-levodopa 25-100 MG per tablet  Commonly known as:  SINEMET IR     ferrous gluconate 324 MG tablet  Commonly known as:  FERGON  Take 324 mg by mouth every evening.     furosemide 40 MG tablet  Commonly known as:  LASIX  Take 40 mg by mouth every evening.     glucose blood test strip  Commonly known as:  ACCU-CHEK SMARTVIEW  accu-chek smart view test strips, use to check blood sugars 2 times per day DAW, dx code E11.65     HYDROcodone-acetaminophen 5-325 MG per tablet  Commonly known as:  NORCO/VICODIN     insulin lispro 100 UNIT/ML KiwkPen   Commonly known as:  HUMALOG  Inject 8 units at breakfast 8 units at lunch and 12 units at supper     isosorbide dinitrate 30 MG tablet  Commonly known as:  ISORDIL  Take 30 mg by mouth every morning.     isosorbide mononitrate 30 MG 24 hr tablet  Commonly known as:  IMDUR     LAMICTAL 100 MG tablet  Generic drug:  lamoTRIgine  Take 100-150 mg by mouth 2 (two) times daily. 1 tab in am, 1.5 tab in pm     lamoTRIgine 150 MG tablet  Commonly known as:  LAMICTAL     LANTUS SOLOSTAR 100 UNIT/ML Solostar Pen  Generic drug:  Insulin Glargine  INJECT 38 UNITS SUBCUTANEOUSLY ONCE DAILY AT  10  PM     Liraglutide 18 MG/3ML Sopn  Commonly known as:  VICTOZA  Inject 1.8 daily     meloxicam 15 MG tablet  Commonly known as:  MOBIC  Take 15 mg by mouth daily.     nebivolol 10 MG tablet  Commonly known as:  BYSTOLIC  Take 10 mg by mouth daily.     olmesartan 40 MG tablet  Commonly known as:  BENICAR  Take 40 mg by mouth daily.     omega-3 acid ethyl esters 1 G capsule  Commonly known as:  LOVAZA  Take 1 g by mouth daily.     Pancrelipase (Lip-Prot-Amyl) 25000 UNITS Cpep  Take 2 capsules by mouth 3 (three) times daily.     potassium chloride SA 20 MEQ tablet  Commonly known as:  K-DUR,KLOR-CON  Take 20 mEq by mouth daily.     primidone 50 MG tablet  Commonly known as:  MYSOLINE     sertraline 100 MG tablet  Commonly known as:  ZOLOFT  Take 100 mg by mouth daily.     traMADol 50 MG tablet  Commonly known as:  ULTRAM  Take 50 mg by mouth every 6 (six) hours as needed for pain.        Allergies:  Allergies  Allergen Reactions  . Codeine     Headache and "just feel off"  . Penicillins Hives  . Sulfa Antibiotics Hives  . Other Rash    Bleach    Past Medical History  Diagnosis Date  . Myocardial infarction   .  Respiratory failure      multifactorial secondary to congestive heart failure, morbid obesity, decreased abdominal  compliance in the setting of  pancreatitis.  . Diastolic heart failure   . Leukocytosis     secondary to steroid and pancreatitis.  . DM (diabetes mellitus)   . HTN (hypertension)   . Seizure disorder   . Hyperlipidemia   . Back pain   . Obstructive sleep apnea   . Empty sella     on MRI in 2009.  . Diverticulitis   . Abnormal liver function      in the past.  . Headache(784.0)   . Pneumonia   . Stroke   . Seizures   . Pancreatitis   . Family history of anesthesia complication     History of a seizure  . Anemia   . Depression     Past Surgical History  Procedure Laterality Date  . Cholecystectomy    . Abdominal hysterectomy    . Tubal ligation    . Tonsillectomy    . Appendectomy    . Cardiac catheterization      jan 2012  . Rectal polypectomy    . Eye cysts    . Back surgery    . Colonoscopy N/A 08/15/2012    Procedure: COLONOSCOPY;  Surgeon: Arta Silence, MD;  Location: [facility];  Service: Endoscopy;  Laterality: N/A;    Family History  Problem Relation Age of Onset  . Allergies Father   . Heart disease Father     before 86  . Heart failure Father   . Hypertension Father   . Hyperlipidemia Father   . Heart disease Mother   . Diabetes Mother   . Hypertension Mother   . Hyperlipidemia Mother   . Hypertension Sister   . Heart disease Sister     before 39  . Hyperlipidemia Sister   . Cancer Other     Social History:  reports that she quit smoking about 32 years ago. Her smoking use included Cigarettes. She has a 12.5 pack-year smoking history. She has never used smokeless tobacco. She reports that she does not drink alcohol or use illicit drugs.  ROS:  She has had regular eye exams  Hypertension: currently on Bystolic, Benicar and Lasix.  Does not monitor at home  Hypercholesterolemia: On Lipitor, ? compliance now and LDL is above 100  Lab Results  Component Value Date   CHOL 190 07/05/2014   HDL 56.80 07/05/2014   LDLCALC 115* 07/05/2014   LDLDIRECT 146.1 05/06/2013    TRIG 93.0 07/05/2014   CHOLHDL 3 07/05/2014   Foot exam done in 5/15  Edema treated with Lasix 40 mg  Taking Meloxicam frequently for pain in her feet    Examination:   BP 145/83 mmHg  Pulse 72  Temp(Src) 97.9 F (36.6 C)  Resp 16  Ht 5\' 1"  (1.549 m)  Wt 274 lb (124.286 kg)  BMI 51.80 kg/m2  SpO2 95%  Body mass index is 51.8 kg/(m^2).   No ankle edema  Assesment:   Diabetes type 2, uncontrolled    Her blood sugars have been overall better but her A1c is still over 7% See history of present illness for detailed discussion of current blood sugar patterns, problems identified and current management However she is having excellent blood sugars more recently and was tending to have hypoglycemia last month also Difficult to assess her blood sugar readings as she is not monitoring after meals and only in the morning  recently The last few fasting glucoses have been in the 80-90 range Despite her exercise she has gained weight progressively in the last several months She is still trying to watch her diet but has made changes only recently Also taking 1.8 Victoza Currently is however taking relatively small amounts of mealtime insulin and her Lantus has been reduced also  HYPERTENSION: Her blood pressure is being managed by PCP and today it is high normal She does take meloxicam daily, 15 mg  LIPIDS: LDL is mildly increased at 115 and not clear if she is taking her Lipitor.  She does not handle her medications and her sister is not available for confirmation today Discussed need for regular treatment and LDL levels  PLAN:   She will try check blood sugars more consistently after meals as discussed, discussed blood sugar targets.    May need to adjust her Humalog based on glucose readings   Advised her on reducing Lantus to 32 units and further if morning sugars are improving consistently  She will check on her prescription for Lipitor  Continue exercise regimen  Try  to take only 7.5 mg meloxicam  Patient Instructions  Meloxicam 1/2 tab daily  Lantus 32 units daily and if am sugar stays < 90 then do 30 next week  Please check blood sugars at least half the time about 2 hours after any meal and 3-4 times per week on waking up. Please bring blood sugar monitor to each visit  Make sure taking Lipitor daily   Counseling time over 50% of today's 25 minute visit  Deserea Bordley 07/09/2014, 1:44 PM   Labs:  Appointment on 07/05/2014  Component Date Value Ref Range Status  . Hgb A1c MFr Bld 07/05/2014 7.3* 4.6 - 6.5 % Final   Glycemic Control Guidelines for People with Diabetes:Non Diabetic:  <6%Goal of Therapy: <7%Additional Action Suggested:  >8%   . Sodium 07/05/2014 138  135 - 145 mEq/L Final  . Potassium 07/05/2014 4.2  3.5 - 5.1 mEq/L Final  . Chloride 07/05/2014 102  96 - 112 mEq/L Final  . CO2 07/05/2014 30  19 - 32 mEq/L Final  . Glucose, Bld 07/05/2014 103* 70 - 99 mg/dL Final  . BUN 07/05/2014 10  6 - 23 mg/dL Final  . Creatinine, Ser 07/05/2014 0.8  0.4 - 1.2 mg/dL Final  . Total Bilirubin 07/05/2014 0.5  0.2 - 1.2 mg/dL Final  . Alkaline Phosphatase 07/05/2014 92  39 - 117 U/L Final  . AST 07/05/2014 14  0 - 37 U/L Final  . ALT 07/05/2014 15  0 - 35 U/L Final  . Total Protein 07/05/2014 7.7  6.0 - 8.3 g/dL Final  . Albumin 07/05/2014 3.6  3.5 - 5.2 g/dL Final  . Calcium 07/05/2014 8.8  8.4 - 10.5 mg/dL Final  . GFR 07/05/2014 92.22  >60.00 mL/min Final  . Cholesterol 07/05/2014 190  0 - 200 mg/dL Final   ATP III Classification       Desirable:  < 200 mg/dL               Borderline High:  200 - 239 mg/dL          High:  > = 240 mg/dL  . Triglycerides 07/05/2014 93.0  0.0 - 149.0 mg/dL Final   Normal:  <150 mg/dLBorderline High:  150 - 199 mg/dL  . HDL 07/05/2014 56.80  >39.00 mg/dL Final  . VLDL 07/05/2014 18.6  0.0 - 40.0 mg/dL Final  . LDL Cholesterol  07/05/2014 115* 0 - 99 mg/dL Final  . Total CHOL/HDL Ratio 07/05/2014 3   Final                   Men          Women1/2 Average Risk     3.4          3.3Average Risk          5.0          4.42X Average Risk          9.6          7.13X Average Risk          15.0          11.0                      . NonHDL 07/05/2014 133.20   Final   NOTE:  Non-HDL goal should be 30 mg/dL higher than patient's LDL goal (i.e. LDL goal of < 70 mg/dL, would have non-HDL goal of < 100 mg/dL)  . Microalb, Ur 07/05/2014 3.0* 0.0 - 1.9 mg/dL Final  . Creatinine,U 07/05/2014 59.7   Final  . Microalb Creat Ratio 07/05/2014 5.0  0.0 - 30.0 mg/g Final  Abstract on 07/02/2014  Component Date Value Ref Range Status  . TSH 06/10/2014 2.13  .41 - 5.90 uIU/mL Final  Abstract on 07/02/2014  Component Date Value Ref Range Status  . Creatinine 06/10/2014 1.0  .5 - 1.1 mg/dL Final  . Hgb A1c MFr Bld 06/10/2014 7.1* 4.0 - 6.0 % Final

## 2014-07-14 DIAGNOSIS — Z803 Family history of malignant neoplasm of breast: Secondary | ICD-10-CM | POA: Diagnosis not present

## 2014-07-14 DIAGNOSIS — Z1231 Encounter for screening mammogram for malignant neoplasm of breast: Secondary | ICD-10-CM | POA: Diagnosis not present

## 2014-07-19 ENCOUNTER — Other Ambulatory Visit: Payer: Self-pay | Admitting: *Deleted

## 2014-07-19 ENCOUNTER — Encounter: Payer: Self-pay | Admitting: Endocrinology

## 2014-07-19 MED ORDER — BD SWAB SINGLE USE REGULAR PADS: MEDICATED_PAD | Status: DC

## 2014-07-19 MED ORDER — GLUCOSE BLOOD VI STRP: ORAL_STRIP | Status: DC

## 2014-07-19 MED ORDER — ACCU-CHEK SMARTVIEW CONTROL VI LIQD: Status: DC

## 2014-07-19 MED ORDER — ACCU-CHEK NANO SMARTVIEW W/DEVICE KIT: PACK | Status: DC

## 2014-07-19 MED ORDER — ACCU-CHEK FASTCLIX LANCETS MISC: Status: DC

## 2014-07-28 ENCOUNTER — Other Ambulatory Visit: Payer: Self-pay | Admitting: Endocrinology

## 2014-07-29 ENCOUNTER — Other Ambulatory Visit: Payer: Self-pay | Admitting: *Deleted

## 2014-07-29 MED ORDER — BD SWAB SINGLE USE REGULAR PADS: MEDICATED_PAD | Status: DC

## 2014-07-29 MED ORDER — ACCU-CHEK SMARTVIEW CONTROL VI LIQD: Status: DC

## 2014-08-06 DIAGNOSIS — M5126 Other intervertebral disc displacement, lumbar region: Secondary | ICD-10-CM | POA: Diagnosis not present

## 2014-08-06 DIAGNOSIS — M4807 Spinal stenosis, lumbosacral region: Secondary | ICD-10-CM | POA: Diagnosis not present

## 2014-08-06 DIAGNOSIS — M5416 Radiculopathy, lumbar region: Secondary | ICD-10-CM | POA: Diagnosis not present

## 2014-08-06 DIAGNOSIS — I10 Essential (primary) hypertension: Secondary | ICD-10-CM | POA: Diagnosis not present

## 2014-08-06 DIAGNOSIS — M5136 Other intervertebral disc degeneration, lumbar region: Secondary | ICD-10-CM | POA: Diagnosis not present

## 2014-08-06 DIAGNOSIS — Z6841 Body Mass Index (BMI) 40.0 and over, adult: Secondary | ICD-10-CM | POA: Diagnosis not present

## 2014-08-11 ENCOUNTER — Other Ambulatory Visit: Payer: Self-pay | Admitting: Neurosurgery

## 2014-08-11 DIAGNOSIS — M5416 Radiculopathy, lumbar region: Secondary | ICD-10-CM

## 2014-08-19 ENCOUNTER — Ambulatory Visit
Admission: RE | Admit: 2014-08-19 | Discharge: 2014-08-19 | Disposition: A | Discharge status: Home / Self Care | Payer: Commercial Managed Care - HMO | Source: Ambulatory Visit | Attending: Neurosurgery | Admitting: Neurosurgery

## 2014-08-19 DIAGNOSIS — M5137 Other intervertebral disc degeneration, lumbosacral region: Secondary | ICD-10-CM | POA: Diagnosis not present

## 2014-08-19 DIAGNOSIS — M5416 Radiculopathy, lumbar region: Secondary | ICD-10-CM

## 2014-08-26 ENCOUNTER — Other Ambulatory Visit: Payer: Self-pay | Admitting: Neurosurgery

## 2014-08-26 DIAGNOSIS — M4807 Spinal stenosis, lumbosacral region: Secondary | ICD-10-CM | POA: Diagnosis not present

## 2014-08-26 DIAGNOSIS — I10 Essential (primary) hypertension: Secondary | ICD-10-CM | POA: Diagnosis not present

## 2014-08-26 DIAGNOSIS — Z6841 Body Mass Index (BMI) 40.0 and over, adult: Secondary | ICD-10-CM | POA: Diagnosis not present

## 2014-08-26 DIAGNOSIS — M5136 Other intervertebral disc degeneration, lumbar region: Secondary | ICD-10-CM | POA: Diagnosis not present

## 2014-08-30 ENCOUNTER — Other Ambulatory Visit: Payer: Self-pay

## 2014-09-02 ENCOUNTER — Emergency Department (HOSPITAL_COMMUNITY): Payer: Commercial Managed Care - HMO

## 2014-09-02 ENCOUNTER — Encounter (HOSPITAL_COMMUNITY): Payer: Self-pay | Admitting: *Deleted

## 2014-09-02 ENCOUNTER — Emergency Department (HOSPITAL_COMMUNITY)
Admission: EM | Admit: 2014-09-02 | Discharge: 2014-09-02 | Disposition: A | Discharge status: Home / Self Care | Payer: Commercial Managed Care - HMO | Attending: Emergency Medicine | Admitting: Emergency Medicine

## 2014-09-02 DIAGNOSIS — R011 Cardiac murmur, unspecified: Secondary | ICD-10-CM | POA: Diagnosis not present

## 2014-09-02 DIAGNOSIS — R05 Cough: Secondary | ICD-10-CM | POA: Diagnosis not present

## 2014-09-02 DIAGNOSIS — G40909 Epilepsy, unspecified, not intractable, without status epilepticus: Secondary | ICD-10-CM | POA: Diagnosis not present

## 2014-09-02 DIAGNOSIS — Z79899 Other long term (current) drug therapy: Secondary | ICD-10-CM | POA: Insufficient documentation

## 2014-09-02 DIAGNOSIS — R079 Chest pain, unspecified: Secondary | ICD-10-CM | POA: Diagnosis not present

## 2014-09-02 DIAGNOSIS — Z87891 Personal history of nicotine dependence: Secondary | ICD-10-CM | POA: Diagnosis not present

## 2014-09-02 DIAGNOSIS — Z8719 Personal history of other diseases of the digestive system: Secondary | ICD-10-CM | POA: Diagnosis not present

## 2014-09-02 DIAGNOSIS — F329 Major depressive disorder, single episode, unspecified: Secondary | ICD-10-CM | POA: Diagnosis not present

## 2014-09-02 DIAGNOSIS — Z9889 Other specified postprocedural states: Secondary | ICD-10-CM | POA: Insufficient documentation

## 2014-09-02 DIAGNOSIS — E11649 Type 2 diabetes mellitus with hypoglycemia without coma: Secondary | ICD-10-CM | POA: Diagnosis not present

## 2014-09-02 DIAGNOSIS — Z791 Long term (current) use of non-steroidal anti-inflammatories (NSAID): Secondary | ICD-10-CM | POA: Diagnosis not present

## 2014-09-02 DIAGNOSIS — Z880 Allergy status to penicillin: Secondary | ICD-10-CM | POA: Insufficient documentation

## 2014-09-02 DIAGNOSIS — I252 Old myocardial infarction: Secondary | ICD-10-CM | POA: Insufficient documentation

## 2014-09-02 DIAGNOSIS — E785 Hyperlipidemia, unspecified: Secondary | ICD-10-CM | POA: Insufficient documentation

## 2014-09-02 DIAGNOSIS — J811 Chronic pulmonary edema: Secondary | ICD-10-CM | POA: Diagnosis not present

## 2014-09-02 DIAGNOSIS — Z794 Long term (current) use of insulin: Secondary | ICD-10-CM | POA: Insufficient documentation

## 2014-09-02 DIAGNOSIS — R404 Transient alteration of awareness: Secondary | ICD-10-CM | POA: Diagnosis not present

## 2014-09-02 DIAGNOSIS — Z8701 Personal history of pneumonia (recurrent): Secondary | ICD-10-CM | POA: Diagnosis not present

## 2014-09-02 DIAGNOSIS — Z8673 Personal history of transient ischemic attack (TIA), and cerebral infarction without residual deficits: Secondary | ICD-10-CM | POA: Diagnosis not present

## 2014-09-02 DIAGNOSIS — R55 Syncope and collapse: Secondary | ICD-10-CM

## 2014-09-02 DIAGNOSIS — J9690 Respiratory failure, unspecified, unspecified whether with hypoxia or hypercapnia: Secondary | ICD-10-CM | POA: Diagnosis not present

## 2014-09-02 DIAGNOSIS — R1012 Left upper quadrant pain: Secondary | ICD-10-CM | POA: Insufficient documentation

## 2014-09-02 DIAGNOSIS — Z7982 Long term (current) use of aspirin: Secondary | ICD-10-CM | POA: Insufficient documentation

## 2014-09-02 DIAGNOSIS — I5030 Unspecified diastolic (congestive) heart failure: Secondary | ICD-10-CM | POA: Diagnosis not present

## 2014-09-02 DIAGNOSIS — Z862 Personal history of diseases of the blood and blood-forming organs and certain disorders involving the immune mechanism: Secondary | ICD-10-CM | POA: Insufficient documentation

## 2014-09-02 DIAGNOSIS — R569 Unspecified convulsions: Secondary | ICD-10-CM | POA: Diagnosis not present

## 2014-09-02 DIAGNOSIS — I517 Cardiomegaly: Secondary | ICD-10-CM | POA: Diagnosis not present

## 2014-09-02 DIAGNOSIS — R531 Weakness: Secondary | ICD-10-CM | POA: Diagnosis present

## 2014-09-02 DIAGNOSIS — I10 Essential (primary) hypertension: Secondary | ICD-10-CM | POA: Insufficient documentation

## 2014-09-02 LAB — CBG MONITORING, ED
Glucose-Capillary: 213 mg/dL — ABNORMAL HIGH (ref 70–99)
Glucose-Capillary: 187 mg/dL — ABNORMAL HIGH (ref 70–99)

## 2014-09-02 LAB — URINALYSIS, ROUTINE W REFLEX MICROSCOPIC
Bilirubin Urine: NEGATIVE
Glucose, UA: NEGATIVE mg/dL
Hgb urine dipstick: NEGATIVE
Ketones, ur: NEGATIVE mg/dL
Leukocytes, UA: NEGATIVE
Nitrite: NEGATIVE
Protein, ur: NEGATIVE mg/dL
Specific Gravity, Urine: 1.006 (ref 1.005–1.030)
Urobilinogen, UA: 0.2 mg/dL (ref 0.0–1.0)
pH: 6 (ref 5.0–8.0)

## 2014-09-02 LAB — CBC WITH DIFFERENTIAL/PLATELET
Basophils Absolute: 0 10*3/uL (ref 0.0–0.1)
Basophils Relative: 0 % (ref 0–1)
Eosinophils Absolute: 0.1 10*3/uL (ref 0.0–0.7)
Eosinophils Relative: 1 % (ref 0–5)
HCT: 37.3 % (ref 36.0–46.0)
Hemoglobin: 11.5 g/dL — ABNORMAL LOW (ref 12.0–15.0)
Lymphocytes Relative: 20 % (ref 12–46)
Lymphs Abs: 1.1 10*3/uL (ref 0.7–4.0)
MCH: 22.8 pg — ABNORMAL LOW (ref 26.0–34.0)
MCHC: 30.8 g/dL (ref 30.0–36.0)
MCV: 73.9 fL — ABNORMAL LOW (ref 78.0–100.0)
Monocytes Absolute: 0.4 10*3/uL (ref 0.1–1.0)
Monocytes Relative: 7 % (ref 3–12)
Neutro Abs: 4 10*3/uL (ref 1.7–7.7)
Neutrophils Relative %: 72 % (ref 43–77)
Platelets: 292 10*3/uL (ref 150–400)
RBC: 5.05 MIL/uL (ref 3.87–5.11)
RDW: 16.6 % — ABNORMAL HIGH (ref 11.5–15.5)
WBC: 5.6 10*3/uL (ref 4.0–10.5)

## 2014-09-02 LAB — I-STAT TROPONIN, ED: Troponin i, poc: 0 ng/mL (ref 0.00–0.08)

## 2014-09-02 LAB — I-STAT CHEM 8, ED
BUN: 14 mg/dL (ref 6–23)
Calcium, Ion: 1.15 mmol/L (ref 1.13–1.30)
Chloride: 98 mmol/L (ref 96–112)
Creatinine, Ser: 0.9 mg/dL (ref 0.50–1.10)
Glucose, Bld: 213 mg/dL — ABNORMAL HIGH (ref 70–99)
HCT: 41 % (ref 36.0–46.0)
Hemoglobin: 13.9 g/dL (ref 12.0–15.0)
Potassium: 3 mmol/L — ABNORMAL LOW (ref 3.5–5.1)
Sodium: 140 mmol/L (ref 135–145)
TCO2: 27 mmol/L (ref 0–100)

## 2014-09-02 LAB — BRAIN NATRIURETIC PEPTIDE: B Natriuretic Peptide: 86.8 pg/mL (ref 0.0–100.0)

## 2014-09-02 MED ORDER — IOHEXOL 350 MG/ML SOLN
100.0000 mL | Freq: Once | INTRAVENOUS | Status: AC | PRN
  Administered 2014-09-02: 100 mL via INTRAVENOUS

## 2014-09-02 NOTE — ED Notes (Signed)
Returned from xray

## 2014-09-02 NOTE — ED Provider Notes (Signed)
CSN: [id number]     Arrival date & time 09/02/14  0146 History   First MD Initiated Contact with Patient 09/02/14 0242     Chief Complaint  Patient presents with  . Weakness    (Consider location/radiation/quality/duration/timing/severity/associated sxs/prior Treatment) HPI Comments: Patient is a 69 year old female with a history of diastolic heart failure, diabetes mellitus, hypertension, stroke, pancreatitis, and ACS who presents to the emergency department today for further evaluation of weakness. Patient states that she awoke from sleep feeling very flushed and weak. She states that she checked her sugar and it was 68. She states that she usually runs between 114 and 125. She ate a bowl of cereal and rechecked her sugar. It was 76 at this time. She ate a second bolus cereal and next wanted to walk to the bathroom. She was being assisted by her son when patient lost consciousness; she was lowered to the floor by her son. Son reports that patient was unconscious, but breathing for approximately 5 minutes. She regained consciousness after a cold ice pack was applied to her neck. Patient denies any complaints prior to symptom onset. She states that she had a very normal day yesterday and had a good appetite. She went to the [facility] to exercise which she does do on occasion. She denies any changes to her insulin regimen. No recent fever, upper respiratory symptoms, shortness of breath, vomiting or diarrhea, or dysuria. No sick contacts. She states that she had a pain in her left chest which radiated to her left nipple yesterday afternoon, but this resolved. She has no pain in her chest at this time or since being awoken from sleep.  Patient is a 69 y.o. female presenting with weakness. The history is provided by the patient. No language interpreter was used.  Weakness Associated symptoms include chest pain, coughing, nausea and weakness (generalized). Pertinent negatives include no abdominal pain,  congestion, fever or vomiting.    Past Medical History  Diagnosis Date  . Myocardial infarction   . Respiratory failure      multifactorial secondary to congestive heart failure, morbid obesity, decreased abdominal  compliance in the setting of pancreatitis.  . Diastolic heart failure   . Leukocytosis     secondary to steroid and pancreatitis.  . DM (diabetes mellitus)   . HTN (hypertension)   . Seizure disorder   . Hyperlipidemia   . Back pain   . Obstructive sleep apnea   . Empty sella     on MRI in 2009.  . Diverticulitis   . Abnormal liver function      in the past.  . Headache(784.0)   . Pneumonia   . Stroke   . Seizures   . Pancreatitis   . Family history of anesthesia complication     History of a seizure  . Anemia   . Depression    Past Surgical History  Procedure Laterality Date  . Cholecystectomy    . Abdominal hysterectomy    . Tubal ligation    . Tonsillectomy    . Appendectomy    . Cardiac catheterization      jan 2012  . Rectal polypectomy    . Eye cysts    . Back surgery    . Colonoscopy N/A 08/15/2012    Procedure: COLONOSCOPY;  Surgeon: Arta Silence, MD;  Location: [facility];  Service: Endoscopy;  Laterality: N/A;   Family History  Problem Relation Age of Onset  . Allergies Father   . Heart disease Father  before 60  . Heart failure Father   . Hypertension Father   . Hyperlipidemia Father   . Heart disease Mother   . Diabetes Mother   . Hypertension Mother   . Hyperlipidemia Mother   . Hypertension Sister   . Heart disease Sister     before 65  . Hyperlipidemia Sister   . Cancer Other    History  Substance Use Topics  . Smoking status: Former Smoker -- 0.50 packs/day for 25 years    Types: Cigarettes    Quit date: 03/21/1982  . Smokeless tobacco: Never Used  . Alcohol Use: No   OB History    No data available      Review of Systems  Constitutional: Negative for fever.  HENT: Negative for congestion and rhinorrhea.    Respiratory: Positive for cough. Negative for shortness of breath.   Cardiovascular: Positive for chest pain.  Gastrointestinal: Positive for nausea. Negative for vomiting, abdominal pain and diarrhea.  Genitourinary: Negative for dysuria and hematuria.  Neurological: Positive for syncope and weakness (generalized).  All other systems reviewed and are negative.   Allergies  Codeine; Penicillins; Sulfa antibiotics; and Other  Home Medications   Prior to Admission medications   Medication Sig Start Date End Date Taking? Authorizing Provider  ARIPiprazole (ABILIFY) 10 MG tablet Take 10 mg by mouth daily.   Yes Historical Provider, MD  aspirin EC 81 MG tablet Take 1 tablet (81 mg total) by mouth at bedtime. 11/12/12  Yes Hosie Poisson, MD  atorvastatin (LIPITOR) 40 MG tablet Take 40 mg by mouth daily.   Yes Historical Provider, MD  carbidopa-levodopa (SINEMET IR) 25-100 MG per tablet Take 1 tablet by mouth 3 (three) times daily.  02/11/13  Yes Historical Provider, MD  furosemide (LASIX) 40 MG tablet Take 40 mg by mouth every evening.    Yes Historical Provider, MD  glucose blood (ACCU-CHEK SMARTVIEW) test strip accu-chek smart view test strips, use to check blood sugars 3 times per day DAW, dx code E11.65 07/19/14  Yes Elayne Snare, MD  HYDROcodone-acetaminophen (NORCO/VICODIN) 5-325 MG per tablet Take 1 tablet by mouth every 4 (four) hours as needed for moderate pain.  04/04/13  Yes Historical Provider, MD  insulin lispro (HUMALOG) 100 UNIT/ML KiwkPen Inject 8 units at breakfast 8 units at lunch and 12 units at supper Patient taking differently: Inject 6 units at breakfast 6 units at lunch and 10 units at supper 11/05/13  Yes Elayne Snare, MD  isosorbide mononitrate (IMDUR) 30 MG 24 hr tablet Take 30 mg by mouth daily.  09/24/12  Yes Historical Provider, MD  lamoTRIgine (LAMICTAL) 150 MG tablet Take 150 mg by mouth daily.  06/05/14  Yes Historical Provider, MD  LANTUS SOLOSTAR 100 UNIT/ML Solostar Pen  INJECT 38 UNITS SUBCUTANEOUSLY ONCE DAILY AT  10  PM Patient taking differently: INJECT 34 UNITS SUBCUTANEOUSLY ONCE DAILY AT  10  PM 06/24/14  Yes Elayne Snare, MD  Liraglutide (VICTOZA) 18 MG/3ML SOPN Inject 1.8 daily 11/12/13  Yes Elayne Snare, MD  meloxicam (MOBIC) 15 MG tablet Take 7.5 mg by mouth daily.    Yes Historical Provider, MD  nebivolol (BYSTOLIC) 10 MG tablet Take 10 mg by mouth daily.     Yes Historical Provider, MD  olmesartan (BENICAR) 40 MG tablet Take 40 mg by mouth daily.   Yes Historical Provider, MD  omega-3 acid ethyl esters (LOVAZA) 1 G capsule Take 1 g by mouth daily.   Yes Historical Provider, MD  Pancrelipase,  Lip-Prot-Amyl, 25000 UNITS CPEP Take 2 capsules by mouth 3 (three) times daily.    Yes Historical Provider, MD  potassium chloride SA (K-DUR,KLOR-CON) 20 MEQ tablet Take 20 mEq by mouth daily.     Yes Historical Provider, MD  primidone (MYSOLINE) 50 MG tablet Take 50 mg by mouth at bedtime.  04/19/14  Yes Historical Provider, MD  sertraline (ZOLOFT) 100 MG tablet Take 50 mg by mouth daily.    Yes Historical Provider, MD  ACCU-CHEK FASTCLIX LANCETS MISC Use to check blood sugar 3 times per day dx code E11.65 07/19/14   Elayne Snare, MD  albuterol (PROVENTIL HFA;VENTOLIN HFA) 108 (90 BASE) MCG/ACT inhaler Inhale 2 puffs into the lungs every 6 (six) hours as needed for wheezing.    Historical Provider, MD  Alcohol Swabs (B-D SINGLE USE SWABS REGULAR) PADS Use as directed 07/29/14   Elayne Snare, MD  atorvastatin (LIPITOR) 40 MG tablet TAKE ONE TABLET BY MOUTH IN THE EVENING Patient not taking: Reported on 09/02/2014 05/13/14   Elayne Snare, MD  Blood Glucose Calibration (ACCU-CHEK SMARTVIEW CONTROL) LIQD Use as directed 07/29/14   Elayne Snare, MD  Blood Glucose Monitoring Suppl (ACCU-CHEK NANO SMARTVIEW) W/DEVICE KIT Use to check blood sugar 3 times per day dx code E11.65 07/19/14   Elayne Snare, MD  VICTOZA 18 MG/3ML SOPN INJECT 1.8 MG INTO THE SKIN DAILY Patient not taking: Reported on  09/02/2014 07/29/14   Elayne Snare, MD   BP 143/74 mmHg  Pulse 68  Temp(Src) 98.7 F (37.1 C) (Oral)  Resp 18  SpO2 94%   Physical Exam  Constitutional: She is oriented to person, place, and time. She appears well-developed and well-nourished. No distress.  Nontoxic/nonseptic appearing  HENT:  Head: Normocephalic and atraumatic.  Mouth/Throat: Oropharynx is clear and moist. No oropharyngeal exudate.  Eyes: Conjunctivae and EOM are normal. No scleral icterus.  Neck: Normal range of motion.  Cardiovascular: Normal rate, regular rhythm and intact distal pulses.   Murmur heard. Pulmonary/Chest: Effort normal and breath sounds normal. No respiratory distress. She has no wheezes. She has no rales.  Abdominal: Soft. She exhibits no distension. There is tenderness. There is no rebound and no guarding.  Soft obese abdomen. Mild tenderness to palpation in the left upper quadrant on deep palpation. No peritoneal signs.  Musculoskeletal: Normal range of motion.  Neurological: She is alert and oriented to person, place, and time. She exhibits normal muscle tone. Coordination normal.  GCS 15. Speech is goal oriented. Patient moves extremities without ataxia. No focal neurologic deficits noted.  Skin: Skin is warm and dry. No rash noted. She is not diaphoretic. No erythema. No pallor.  Psychiatric: She has a normal mood and affect. Her behavior is normal.  Nursing note and vitals reviewed.   ED Course  Procedures (including critical care time) Labs Review Labs Reviewed  CBC WITH DIFFERENTIAL/PLATELET - Abnormal; Notable for the following:    Hemoglobin 11.5 (*)    MCV 73.9 (*)    MCH 22.8 (*)    RDW 16.6 (*)    All other components within normal limits  CBG MONITORING, ED - Abnormal; Notable for the following:    Glucose-Capillary 213 (*)    All other components within normal limits  I-STAT CHEM 8, ED - Abnormal; Notable for the following:    Potassium 3.0 (*)    Glucose, Bld 213 (*)    All  other components within normal limits  CBG MONITORING, ED - Abnormal; Notable for the following:  Glucose-Capillary 187 (*)    All other components within normal limits  URINALYSIS, ROUTINE W REFLEX MICROSCOPIC  URINALYSIS, ROUTINE W REFLEX MICROSCOPIC  BRAIN NATRIURETIC PEPTIDE  I-STAT TROPOININ, ED    Imaging Review Dg Chest 2 View  09/02/2014   CLINICAL DATA:  Weakness.  Awoke from sleep feeling nauseous.  EXAM: CHEST  2 VIEW  COMPARISON:  08/22/2012  FINDINGS: There is mild cardiomegaly. Mild pulmonary vascular congestion. No pulmonary edema. No consolidation, pleural effusion, or pneumothorax. No acute osseous abnormalities are seen. Degenerative change throughout the spine.  IMPRESSION: Mild cardiomegaly and pulmonary vascular congestion.   Electronically Signed   By: Jeb Levering M.D.   On: 09/02/2014 04:18   Ct Angio Chest Pe W/cm &/or Wo Cm  09/02/2014   CLINICAL DATA:  Respiratory failure.  Weakness.  EXAM: CT ANGIOGRAPHY CHEST WITH CONTRAST  TECHNIQUE: Multidetector CT imaging of the chest was performed using the standard protocol during bolus administration of intravenous contrast. Multiplanar CT image reconstructions and MIPs were obtained to evaluate the vascular anatomy.  CONTRAST:  133m OMNIPAQUE IOHEXOL 350 MG/ML SOLN  COMPARISON:  Chest radiographs earlier this day. Chest CT 09/02/2010  FINDINGS: There are no filling defects within the central pulmonary arteries to suggest pulmonary embolus. The segmental and subsegmental branches cannot be assessed due to contrast bolus timing. The heart is enlarged. Mild contrast refluxing into the IVC. There is enlargement of the right and left pulmonary arteries concerning for pulmonary arterial hypertension. The thoracic aorta is normal in caliber with trace atherosclerosis. Shotty mediastinal adenopathy in the lower lower paratracheal and prevascular stations. No pleural or pericardial effusion.  Mild dependent atelectasis at the lung  bases. No consolidation to suggest pneumonia. No pulmonary nodule or mass.  Evaluation of the upper abdomen demonstrates mild hepatic splenomegaly. There is degenerative change in the thoracic spine. No acute or suspicious osseous abnormality.  Review of the MIP images confirms the above findings.  IMPRESSION: 1. No central pulmonary embolus. 2. Enlarged pulmonary arteries suggesting pulmonary arterial hypertension. Cardiomegaly. Minimal contrast refluxing into the IVC, consistent with right heart failure. 3. Shotty mediastinal lymph nodes, likely reactive.   Electronically Signed   By: MJeb LeveringM.D.   On: 09/02/2014 06:37     EKG Interpretation   Date/Time:  Thursday September 02 2014 03:10:05 EST Ventricular Rate:  66 PR Interval:  207 QRS Duration: 94 QT Interval:  422 QTC Calculation: 442 R Axis:   97 Text Interpretation:  Normal sinus rhythm first degree avb Confirmed by  [facility] MD, APRIL ([id number] on 09/02/2014 3:14:54 AM      MDM   Final diagnoses:  Syncope  Hypoglycemia associated with diabetes    69y/o female presents to the ED for further evaluation of weakness and syncope in the setting of hypoglycemia. CBG stable on arrival with no recurrence of hypoglycemia over ED course. Cardiac work up negative and patient with no c/o chest pain. EKG is nonischemic. CTA performed to evaluate for potential PE; this was negative. No UTI or evidence of other infectious etiology; no leukocytosis.  Patient resting comfortably in ED. She has been ambulatory in the ED on multiple occassions with no c/o lightheadedness or dizziness. Suspect that syncopal episode was secondary to uncomplicated hypoglycemia which has now resolved. Patient desires d/c and I feel she is appropriate for outpatient f/u with her PCP. Have advised regular CBG checks at home. Return precautions discussed and provided. Patient agreeable to plan with no unaddressed concerns. Patient case  discussed with Dr. Randal Buba  who is in agreement with this work up, assessment, management, and patient's stability for d/c.   Filed Vitals:   09/02/14 0400 09/02/14 0430 09/02/14 0510 09/02/14 0657  BP: 129/82 129/45 137/54 143/74  Pulse: 66 69 70 68  Temp:   98.7 F (37.1 C) 98.7 F (37.1 C)  TempSrc:   Oral Oral  Resp: _0 SpO2: 88% 94% 93% 94%     Antonietta Breach, PA-C 09/05/14 6816  April Palumbo, MD 09/06/14 2352

## 2014-09-02 NOTE — ED Notes (Signed)
Returned from CT.

## 2014-09-02 NOTE — ED Notes (Signed)
Bed: WA15 Expected date:  Expected time:  Means of arrival:  Comments: EMS  

## 2014-09-02 NOTE — Discharge Instructions (Signed)
Hypoglycemia Hypoglycemia occurs when the glucose in your blood is too low. Glucose is a type of sugar that is your body's main energy source. Hormones, such as insulin and glucagon, control the level of glucose in the blood. Insulin lowers blood glucose and glucagon increases blood glucose. Having too much insulin in your blood stream, or not eating enough food containing sugar, can result in hypoglycemia. Hypoglycemia can happen to people with or without diabetes. It can develop quickly and can be a medical emergency.  CAUSES   Missing or delaying meals.  Not eating enough carbohydrates at meals.  Taking too much diabetes medicine.  Not timing your oral diabetes medicine or insulin doses with meals, snacks, and exercise.  Nausea and vomiting.  Certain medicines.  Severe illnesses, such as hepatitis, kidney disorders, and certain eating disorders.  Increased activity or exercise without eating something extra or adjusting medicines.  Drinking too much alcohol.  A nerve disorder that affects body functions like your heart rate, blood pressure, and digestion (autonomic neuropathy).  A condition where the stomach muscles do not function properly (gastroparesis). Therefore, medicines and food may not absorb properly.  Rarely, a tumor of the pancreas can produce too much insulin. SYMPTOMS   Hunger.  Sweating (diaphoresis).  Change in body temperature.  Shakiness.  Headache.  Anxiety.  Lightheadedness.  Irritability.  Difficulty concentrating.  Dry mouth.  Tingling or numbness in the hands or feet.  Restless sleep or sleep disturbances.  Altered speech and coordination.  Change in mental status.  Seizures or prolonged convulsions.  Combativeness.  Drowsiness (lethargic).  Weakness.  Increased heart rate or palpitations.  Confusion.  Pale, gray skin color.  Blurred or double vision.  Fainting. DIAGNOSIS  A physical exam and medical history will be  performed. Your caregiver may make a diagnosis based on your symptoms. Blood tests and other lab tests may be performed to confirm a diagnosis. Once the diagnosis is made, your caregiver will see if your signs and symptoms go away once your blood glucose is raised.  TREATMENT  Usually, you can easily treat your hypoglycemia when you notice symptoms.  Check your blood glucose. If it is less than 70 mg/dl, take one of the following:   3-4 glucose tablets.    cup juice.    cup regular soda.   1 cup skim milk.   -1 tube of glucose gel.   5-6 hard candies.   Avoid high-fat drinks or food that may delay a rise in blood glucose levels.  Do not take more than the recommended amount of sugary foods, drinks, gel, or tablets. Doing so will cause your blood glucose to go too high.   Wait 10-15 minutes and recheck your blood glucose. If it is still less than 70 mg/dl or below your target range, repeat treatment.   Eat a snack if it is more than 1 hour until your next meal.  There may be a time when your blood glucose may go so low that you are unable to treat yourself at home when you start to notice symptoms. You may need someone to help you. You may even faint or be unable to swallow. If you cannot treat yourself, someone will need to bring you to the hospital.  [place]  If you have diabetes, follow your diabetes management plan by:  Taking your medicines as directed.  Following your exercise plan.  Following your meal plan. Do not skip meals. Eat on time.  Testing your blood  glucose regularly. Check your blood glucose before and after exercise. If you exercise longer or different than usual, be sure to check blood glucose more frequently.  Wearing your medical alert jewelry that says you have diabetes.  Identify the cause of your hypoglycemia. Then, develop ways to prevent the recurrence of hypoglycemia.  Do not take a hot bath or shower right after an  insulin shot.  Always carry treatment with you. Glucose tablets are the easiest to carry.  If you are going to drink alcohol, drink it only with meals.  Tell friends or family members ways to keep you safe during a seizure. This may include removing hard or sharp objects from the area or turning you on your side.  Maintain a healthy weight. SEEK MEDICAL CARE IF:   You are having problems keeping your blood glucose in your target range.  You are having frequent episodes of hypoglycemia.  You feel you might be having side effects from your medicines.  You are not sure why your blood glucose is dropping so low.  You notice a change in vision or a new problem with your vision. SEEK IMMEDIATE MEDICAL CARE IF:   Confusion develops.  A change in mental status occurs.  The inability to swallow develops.  Fainting occurs. Document Released: 06/11/2005 Document Revised: 06/16/2013 Document Reviewed: 10/08/2011 [facility] Patient Information 2015 [place], [place]. This information is not intended to replace advice given to you by your health care provider. Make sure you discuss any questions you have with your health care provider.  Syncope Syncope means a person passes out (faints). The person usually wakes up in less than 5 minutes. It is important to seek medical care for syncope. HOME CARE  Have someone stay with you until you feel normal.  Do not drive, use machines, or play sports until your doctor says it is okay.  Keep all doctor visits as told.  Lie down when you feel like you might pass out. Take deep breaths. Wait until you feel normal before standing up.  Drink enough fluids to keep your pee (urine) clear or pale yellow.  If you take blood pressure or heart medicine, get up slowly. Take several minutes to sit and then stand. GET HELP RIGHT AWAY IF:   You have a severe headache.  You have pain in the chest, belly (abdomen), or back.  You are bleeding from the mouth  or butt (rectum).  You have black or tarry poop (stool).  You have an irregular or very fast heartbeat.  You have pain with breathing.  You keep passing out, or you have shaking (seizures) when you pass out.  You pass out when sitting or lying down.  You feel confused.  You have trouble walking.  You have severe weakness.  You have vision problems. If you fainted, call your local emergency services (911 in [place] Do not drive yourself to the hospital. MAKE SURE YOU:   Understand these instructions.  Will watch your condition.  Will get help right away if you are not doing well or get worse. Document Released: 11/28/2007 Document Revised: 12/11/2011 Document Reviewed: 08/10/2011 [facility] Patient Information 2015 [place], [place]. This information is not intended to replace advice given to you by your health care provider. Make sure you discuss any questions you have with your health care provider.

## 2014-09-02 NOTE — ED Notes (Signed)
Per EMS, pt from home, was called out for hypoglycemia, upon arrival to home, pt's CBG was 120.  EMS reports upon arrival to home pt started to "shake" possible seizure activity noted.  However, EMS reports that pt stood up on her own and was A&O x 4.  Pt reports feeling of weakness

## 2014-09-02 NOTE — ED Notes (Signed)
Patient transported to CT 

## 2014-09-02 NOTE — ED Notes (Signed)
PA at bedside.

## 2014-09-02 NOTE — ED Notes (Signed)
Notified EDP,Palumbo,MD., pt. i-stat Chem 8 results pt. Potassium 3.0.

## 2014-09-02 NOTE — ED Notes (Signed)
Pt ambulated from her room to the bathroom near mini lab  Tolerated well

## 2014-09-02 NOTE — ED Notes (Signed)
See triage note.  Pt reports weakness, but denies any pain at this time.  Pt reports she took her BG tonight and it was low ate some food-BG increased to 120 upon EMS arrival to her home.  Pt reports having a seizure episode.  With hx of seizure.  Pt is A&Ox 4.  Family is at bedside.

## 2014-09-20 DIAGNOSIS — E782 Mixed hyperlipidemia: Secondary | ICD-10-CM | POA: Diagnosis not present

## 2014-09-20 DIAGNOSIS — E1140 Type 2 diabetes mellitus with diabetic neuropathy, unspecified: Secondary | ICD-10-CM | POA: Diagnosis not present

## 2014-09-20 DIAGNOSIS — I10 Essential (primary) hypertension: Secondary | ICD-10-CM | POA: Diagnosis not present

## 2014-09-20 DIAGNOSIS — R5383 Other fatigue: Secondary | ICD-10-CM | POA: Diagnosis not present

## 2014-09-20 DIAGNOSIS — M545 Low back pain: Secondary | ICD-10-CM | POA: Diagnosis not present

## 2014-09-20 DIAGNOSIS — E1165 Type 2 diabetes mellitus with hyperglycemia: Secondary | ICD-10-CM | POA: Diagnosis not present

## 2014-09-20 DIAGNOSIS — J069 Acute upper respiratory infection, unspecified: Secondary | ICD-10-CM | POA: Diagnosis not present

## 2014-09-21 ENCOUNTER — Encounter (HOSPITAL_COMMUNITY): Payer: Self-pay

## 2014-09-21 ENCOUNTER — Encounter (HOSPITAL_COMMUNITY)
Admission: RE | Admit: 2014-09-21 | Discharge: 2014-09-21 | Disposition: A | Discharge status: Home / Self Care | Payer: Commercial Managed Care - HMO | Source: Ambulatory Visit | Attending: Neurosurgery | Admitting: Neurosurgery

## 2014-09-21 DIAGNOSIS — E785 Hyperlipidemia, unspecified: Secondary | ICD-10-CM | POA: Insufficient documentation

## 2014-09-21 DIAGNOSIS — I10 Essential (primary) hypertension: Secondary | ICD-10-CM | POA: Diagnosis not present

## 2014-09-21 DIAGNOSIS — Z01812 Encounter for preprocedural laboratory examination: Secondary | ICD-10-CM | POA: Diagnosis not present

## 2014-09-21 DIAGNOSIS — E119 Type 2 diabetes mellitus without complications: Secondary | ICD-10-CM | POA: Diagnosis not present

## 2014-09-21 DIAGNOSIS — I509 Heart failure, unspecified: Secondary | ICD-10-CM | POA: Insufficient documentation

## 2014-09-21 HISTORY — DX: Unspecified cataract: H26.9

## 2014-09-21 HISTORY — DX: Nocturia: R35.1

## 2014-09-21 HISTORY — DX: Personal history of colon polyps, unspecified: Z86.0100

## 2014-09-21 HISTORY — DX: Unspecified osteoarthritis, unspecified site: M19.90

## 2014-09-21 HISTORY — DX: Parkinson's disease without dyskinesia, without mention of fluctuations: G20.A1

## 2014-09-21 HISTORY — DX: Personal history of other medical treatment: Z92.89

## 2014-09-21 HISTORY — DX: Other skin changes: R23.8

## 2014-09-21 HISTORY — DX: Polyneuropathy, unspecified: G62.9

## 2014-09-21 HISTORY — DX: Personal history of colonic polyps: Z86.010

## 2014-09-21 HISTORY — DX: Effusion, unspecified joint: M25.40

## 2014-09-21 HISTORY — DX: Diverticulosis of intestine, part unspecified, without perforation or abscess without bleeding: K57.90

## 2014-09-21 HISTORY — DX: Parkinson's disease: G20

## 2014-09-21 HISTORY — DX: Spontaneous ecchymoses: R23.3

## 2014-09-21 HISTORY — DX: Other chronic pain: G89.29

## 2014-09-21 HISTORY — DX: Unspecified glaucoma: H40.9

## 2014-09-21 HISTORY — DX: Urgency of urination: R39.15

## 2014-09-21 HISTORY — DX: Dorsalgia, unspecified: M54.9

## 2014-09-21 LAB — TYPE AND SCREEN
ABO/RH(D): B POS
Antibody Screen: NEGATIVE

## 2014-09-21 LAB — BASIC METABOLIC PANEL
Anion gap: 8 (ref 5–15)
BUN: 10 mg/dL (ref 6–23)
CO2: 27 mmol/L (ref 19–32)
Calcium: 9.2 mg/dL (ref 8.4–10.5)
Chloride: 100 mmol/L (ref 96–112)
Creatinine, Ser: 0.76 mg/dL (ref 0.50–1.10)
GFR calc Af Amer: >90 mL/min (ref >90)
GFR calc non Af Amer: 86 mL/min — ABNORMAL LOW (ref >90)
Glucose, Bld: 145 mg/dL — ABNORMAL HIGH (ref 70–99)
Potassium: 4.4 mmol/L (ref 3.5–5.1)
Sodium: 135 mmol/L (ref 135–145)

## 2014-09-21 LAB — SURGICAL PCR SCREEN
MRSA, PCR: NEGATIVE
Staphylococcus aureus: NEGATIVE

## 2014-09-21 LAB — CBC
HCT: 39.8 % (ref 36.0–46.0)
Hemoglobin: 12.6 g/dL (ref 12.0–15.0)
MCH: 22.9 pg — ABNORMAL LOW (ref 26.0–34.0)
MCHC: 31.7 g/dL (ref 30.0–36.0)
MCV: 72.4 fL — ABNORMAL LOW (ref 78.0–100.0)
Platelets: 258 10*3/uL (ref 150–400)
RBC: 5.5 MIL/uL — ABNORMAL HIGH (ref 3.87–5.11)
RDW: 16.1 % — ABNORMAL HIGH (ref 11.5–15.5)
WBC: 5.5 10*3/uL (ref 4.0–10.5)

## 2014-09-21 LAB — ABO/RH: ABO/RH(D): B POS

## 2014-09-21 NOTE — Progress Notes (Addendum)
Echo report in epic from 2010  Stress test reports in epic from 2011/2012  Heart cath reports in epic from 2009/2012  EKG and CXR in epic from 09-02-14  Saw cardiologist [place] visit in 2012 after he said everything was ok  Medical Md is Dr.Robyn [facility]

## 2014-09-21 NOTE — Progress Notes (Signed)
Gastro is Dr.Outlaw  Endo Dr.Kumar  Neuro Dr.Boggs  Podiatrist Dr.Petery

## 2014-09-21 NOTE — Progress Notes (Signed)
Average fasting blood sugar runs 110-140

## 2014-09-21 NOTE — Pre-Procedure Instructions (Signed)
[name]  09/21/2014   Your procedure is scheduled on:  Mon, April 11 @ 7:30 AM  Report to [facility]  at 5:30 AM.  Call this number if you have problems the morning of surgery: [phone]   Remember:   Do not eat food or drink liquids after midnight.   Take these medicines the morning of surgery with A SIP OF WATER: Albuterol<Bring Your Inhaler With You>,Abilify(Aripiprazole),Carbidopa-Levodopa(Sinemet),Pain Pill(if needed),Isosorbide(Imdur),Lamictal(Lamotrigine),Bystolic(Nebivolol),and Zoloft(Sertraline)               Stop taking your Fish Oil,Aspirin,and Mobic along with any Vitamins or Herbal Medications. No Goody's,BC's,Aleve,or Ibuprofen.    Do not wear jewelry, make-up or nail polish.  Do not wear lotions, powders, or perfumes. You may wear deodorant.  Do not shave 48 hours prior to surgery.   Do not bring valuables to the hospital.  [facility] is not responsible                  for any belongings or valuables.               Contacts, dentures or bridgework may not be worn into surgery.  Leave suitcase in the car. After surgery it may be brought to your room.  For patients admitted to the hospital, discharge time is determined by your                treatment team.                Special Instructions:  [place] - Preparing for Surgery  Before surgery, you can play an important role.  Because skin is not sterile, your skin needs to be as free of germs as possible.  You can reduce the number of germs on you skin by washing with CHG (chlorahexidine gluconate) soap before surgery.  CHG is an antiseptic cleaner which kills germs and bonds with the skin to continue killing germs even after washing.  Please DO NOT use if you have an allergy to CHG or antibacterial soaps.  If your skin becomes reddened/irritated stop using the CHG and inform your nurse when you arrive at Short Stay.  Do not shave (including legs and underarms) for at least 48 hours prior to the  first CHG shower.  You may shave your face.  Please follow these instructions carefully:   1.  Shower with CHG Soap the night before surgery and the                                morning of Surgery.  2.  If you choose to wash your hair, wash your hair first as usual with your       normal shampoo.  3.  After you shampoo, rinse your hair and body thoroughly to remove the                      Shampoo.  4.  Use CHG as you would any other liquid soap.  You can apply chg directly       to the skin and wash gently with scrungie or a clean washcloth.  5.  Apply the CHG Soap to your body ONLY FROM THE NECK DOWN.        Do not use on open wounds or open sores.  Avoid contact with your eyes,       ears, mouth and genitals (private  parts).  Wash genitals (private parts)       with your normal soap.  6.  Wash thoroughly, paying special attention to the area where your surgery        will be performed.  7.  Thoroughly rinse your body with warm water from the neck down.  8.  DO NOT shower/wash with your normal soap after using and rinsing off       the CHG Soap.  9.  Pat yourself dry with a clean towel.            10.  Wear clean pajamas.            11.  Place clean sheets on your bed the night of your first shower and do not        sleep with pets.  Day of Surgery  Do not apply any lotions/deoderants the morning of surgery.  Please wear clean clothes to the hospital/surgery center.     Please read over the following fact sheets that you were given: Pain Booklet, Coughing and Deep Breathing, Blood Transfusion Information, MRSA Information and Surgical Site Infection Prevention

## 2014-09-21 NOTE — Progress Notes (Addendum)
Anesthesia Chart Review:  Pt is 69 year old female scheduled for 2 level PLIF on 10/04/2014 with Dr. Saintclair Halsted.   PMH includes: CHF, HTN, MI (2012), DM, seizure disorder, OSA, Parkinson's disease, anemia, pancreatitis, hyperlipidemia. Former smoker. BMI 51.   Preoperative labs reviewed.    Chest x-ray 09/02/2014 reviewed. Mild cardiomegaly and pulmonary vascular congestion  EKG 09/02/2014: NSR. First degree avb  Cardiac cath 09/06/2010: This is a 69 year old who had a small perioperative myocardial infarction. Coronary angiography shows minimal coronary artery disease. I suspect the elevated troponin must be due to supply demand mismatch.  Nuclear stress test 08/25/2010: Probably normal Lexiscan Myvoiew. There is a mild reversible defect in the anterior wall which appears to be breast attenuation; however cannot completel exclude ischemia.  Echo 08/14/2010: -LV cavity size is normal. Systolic function was normal. Estimated EF was 60-65%. Wall motion was normal; there were no regional wall motion abnormalities. Doppler parameters were consistent with abnormal LV relaxation (grade I dialstolic dysfunction).  -Ventricular septum: septal motion showed paradox.  -LA was mildly to moderately dilated -RV cavity size was moderately dilated. Systolic function was mildly to moderately reduced.   Last saw cardiology Angelena Form) in 2012. His note at that time states she needed an echo repeated in a year for follow up. This was not done. Discussed with Dr. Kalman Shan. Pt will need cardiac clearance prior to surgery. Notified Vanessa in Dr. Windy Carina office.   Willeen Cass, FNP-BC [facility] Short Stay Surgical Center/Anesthesiology Phone: [phone] 09/21/2014 3:20 PM  Addendum: Patient was seen by cardiologist Dr. Ellyn Hack on 09/27/14.  Echo was ordered and completed 09/28/14 showing: - Left ventricle: Abnormal septal motion The cavity size was normal. Systolic function was normal. The estimated ejection fraction was  in the range of 55% to 60%. Wall motion was normal; there were no regional wall motion abnormalities. Left ventricular diastolic function parameters were normal. - Left atrium: The atrium was mildly to moderately dilated. - Right atrium: The atrium was mildly dilated. - Atrial septum: Redundant and mobile atrial septum with no obvious PFO.  Patient was given cardiac clearance for this procedure.   [facility] Short Stay Center/Anesthesiology Phone [phone] 09/29/2014 3:06 PM

## 2014-09-24 ENCOUNTER — Telehealth: Payer: Self-pay | Admitting: Cardiology

## 2014-09-24 NOTE — Telephone Encounter (Signed)
Received records and Request for cardiac clearance from [facility] for appointment with Dr Ellyn Hack on 09/27/14.  Records given to [facility] (medical records) for Dr Allison Quarry schedule on 09/27/14.  lp

## 2014-09-27 ENCOUNTER — Encounter: Payer: Self-pay | Admitting: Cardiology

## 2014-09-27 ENCOUNTER — Ambulatory Visit (INDEPENDENT_AMBULATORY_CARE_PROVIDER_SITE_OTHER): Payer: Commercial Managed Care - HMO | Admitting: Cardiology

## 2014-09-27 VITALS — BP 158/82 | HR 79 | Ht 61.0 in | Wt 276.2 lb

## 2014-09-27 DIAGNOSIS — R011 Cardiac murmur, unspecified: Secondary | ICD-10-CM | POA: Diagnosis not present

## 2014-09-27 DIAGNOSIS — Z6841 Body Mass Index (BMI) 40.0 and over, adult: Secondary | ICD-10-CM

## 2014-09-27 DIAGNOSIS — I2510 Atherosclerotic heart disease of native coronary artery without angina pectoris: Secondary | ICD-10-CM | POA: Diagnosis not present

## 2014-09-27 DIAGNOSIS — Z01810 Encounter for preprocedural cardiovascular examination: Secondary | ICD-10-CM | POA: Diagnosis not present

## 2014-09-27 DIAGNOSIS — I5032 Chronic diastolic (congestive) heart failure: Secondary | ICD-10-CM

## 2014-09-27 DIAGNOSIS — I10 Essential (primary) hypertension: Secondary | ICD-10-CM

## 2014-09-27 DIAGNOSIS — E1165 Type 2 diabetes mellitus with hyperglycemia: Secondary | ICD-10-CM

## 2014-09-27 DIAGNOSIS — R0609 Other forms of dyspnea: Secondary | ICD-10-CM | POA: Diagnosis not present

## 2014-09-27 DIAGNOSIS — E785 Hyperlipidemia, unspecified: Secondary | ICD-10-CM

## 2014-09-27 DIAGNOSIS — IMO0002 Reserved for concepts with insufficient information to code with codable children: Secondary | ICD-10-CM

## 2014-09-27 NOTE — Patient Instructions (Signed)
FOR SURGICAL CLEARANCE--Your physician has requested that you have an echocardiogram. Echocardiography is a painless test that uses sound waves to create images of your heart. It provides your doctor with information about the size and shape of your heart and how well your heart's chambers and valves are working. This procedure takes approximately one hour. There are no restrictions for this procedure.    Your physician wants you to follow-up in 3 -4 MONTHS Dr Ellyn Hack.  You will receive a reminder letter in the mail two months in advance. If you don't receive a letter, please call our office to schedule the follow-up appointment.

## 2014-09-28 ENCOUNTER — Telehealth: Payer: Self-pay | Admitting: Cardiology

## 2014-09-28 ENCOUNTER — Ambulatory Visit (HOSPITAL_COMMUNITY)
Admission: RE | Admit: 2014-09-28 | Discharge: 2014-09-28 | Disposition: A | Discharge status: Home / Self Care | Payer: Commercial Managed Care - HMO | Source: Ambulatory Visit | Attending: Cardiology | Admitting: Cardiology

## 2014-09-28 ENCOUNTER — Encounter: Payer: Self-pay | Admitting: Cardiology

## 2014-09-28 DIAGNOSIS — R011 Cardiac murmur, unspecified: Secondary | ICD-10-CM

## 2014-09-28 DIAGNOSIS — R0609 Other forms of dyspnea: Secondary | ICD-10-CM | POA: Diagnosis not present

## 2014-09-28 DIAGNOSIS — Z01810 Encounter for preprocedural cardiovascular examination: Secondary | ICD-10-CM | POA: Insufficient documentation

## 2014-09-28 NOTE — Progress Notes (Signed)
2D Echocardiogram Complete.  09/28/2014   Bogdan Vivona [place], RDCS

## 2014-09-28 NOTE — Progress Notes (Signed)
PATIENT: [name] MRN: [id number] DOB: 03-01-1946 PCP: [name], MD  Clinic Note: Chief Complaint  Patient presents with  . Medical Clearance    SURGICAL CLEARANCE FOR BACK SURGERY BY DR Saintclair Halsted, NO CHEST PAIN , 2014 HOSPITAL - POSSIBLE MI , S.O.B, FEET SWELLING 9( FALLING ARCHES  . Hypertension    HPI: [name] is a 69 y.o. female with a PMH below who presents today for preoperative cardiovascular evaluation for a back pain. She has a distant history of the demand ischemia/infarction of the myocardium in the setting of pancreatitis in 2012. She does cardiac risk factors including type 2 diabetes mellitus, hypertension, morbid obesity and dyslipidemia. She has a diagnosis of chronic diastolic heart layer although this is not been confirmed. She also has listed CAD but was nonobstructive by catheterization in 2012.  She is not been seen by cardiology since 2012. She had a cardiology consultation in February 20 12th for "non-STEMI code. Apparently she was previously seen by Dr. Doylene Canard & was lost to follow-up.  She was admitted for pancreatitis in February of 2012 and had positive cardiac enzymes. She had not noted any antecedent anginal type chest pain with rest or exertion. She does have chronic exertional dyspnea with wheezing from her reactive airways disease. She was evaluated with a Myoview stress test that suggested possible anterior ischemia and subsequently underwent cardiac catheterization revealing nonobstructive CAD. She was not seen after her initial posthospital followup and I do not have that clinic note. She did have an aortic murmur on exam and there was recommendation for followup echocardiogram that never occurred.  She has been stable from cardiac standpoint and has not been seen by cardiology in 4 years. She is now referred based on preoperative anesthesia evaluation of her chart. She has planned neurosurgical operation on her back.  She was referred to  cardiology for preoperative assessment based on her prior history. The main focal point was that there was recommendation to followup the echocardiogram.  Interval History: Overall, the patient really doesn't have much in the way of significant symptoms he sat having some exertional dyspnea and mild worsening edema that is dependent.  Over last month or so she is really been limited as far as activity because of her back pain and notes that the pain itself causes her to grunt and groan while walking and that exacerbates or dyspnea. Her sister was with her today notices that her breathing is worse. This exertional dyspnea goes back to the time of her initial evaluation in 2012. It is not a new finding.  Cardiovascular ROS: positive for - dyspnea on exertion, murmur and Brief episode of near syncope related to hypoglycemia several months ago.  Mild dependent edema negative for - chest pain, loss of consciousness, orthopnea, palpitations, paroxysmal nocturnal dyspnea, rapid heart rate, shortness of breath or Syncope, TIA/amaurosis fugax; claudication No melena, hematochezia or hematuria :  Prior to her back bothering her, she was able to carry out these for medical problems without anginal symptoms. Her exertional dyspnea is not new.  She does have a family history of coronary artery disease as well as significant cardiac risk factors.  Past Medical History  Diagnosis Date  . Diastolic heart failure 9935    Grade 1 diastolic Dysfunction by Echo   . Seizure disorder   . Hyperlipidemia     takes Lipitor daily  . Obstructive sleep apnea   . Empty sella     on MRI in 2009.  Marland Kitchen  Abnormal liver function      in the past.  . Pancreatitis     takes Pancrelipase daily  . Anemia   . HTN (hypertension)     takes [employer] daily  . DM (diabetes mellitus)     takes Victoza daily as well as Lantus and Humalog  . Parkinson's disease     takes Sinemet daily  . Demand myocardial  infarction 2012    Demand Infarction in setting of Pancreatitis --> mild Troponin elevation, Non-obstructive CAD  . Peripheral edema     takes Lasix daily  . History of influenza     antibiotic given yesterday along with taking Delsym cough syrup  . Pneumonia 2012  . Headache(784.0)     last migraine-4-55yr ago  . Seizures     takes Lamictal daily and Primidone nightly;last seizure 2wks ago  . Peripheral neuropathy   . Arthritis     all over  . Joint swelling   . Chronic back pain     stenosis  . Bruises easily   . History of colon polyps     benign  . Diverticulosis   . Urinary frequency   . Urinary urgency   . Nocturia   . History of blood transfusion     no abnormal reaction noted  . Cataract     right eye;immature  . Glaucoma   . Depression     takes Abilify daily as well as Zoloft  . Chronic cough     Prior Cardiac Evaluation and Past Surgical History: Past Surgical History  Procedure Laterality Date  . Cholecystectomy    . Abdominal hysterectomy    . Tubal ligation    . Tonsillectomy    . Appendectomy    . Rectal polypectomy    . Eye cysts Bilateral   . Back surgery    . Colonoscopy N/A 08/15/2012    Procedure: COLONOSCOPY;  Surgeon: WArta Silence MD;  Location: [facility];  Service: Endoscopy;  Laterality: N/A;  . Vaginal cyst removed      several times  . Esophagogastroduodenoscopy    . Lasik    . Nm myoview ltd  March 2012    Probably normal [employer] Mild reversible defect in anterior wall appears to be breast attenuation but cannot exclude ischemia:  FALSE POSITIVE  . Cardiac catheterization  2009/March 2012    2009: (Dr. KAlvie Heidelberg Nonobstructive CAD; 2012: Minimal CAD --> false positive stress test  . Transthoracic echocardiogram  November 2010    Mild concentric LVH. EF 55-60%. No RWMA, Abnormal Relaxation (Gr 1 DD). Mildly thickened AoV leaflets w/o stenosis  Cardiac Catheterization March 2012:  FINDINGS: 1. Hemodynamics: Aorta  126/76. 2. Left ventriculography was not done. EF was 60% by recent echo. 3. Right coronary artery: The right coronary artery was a small  nondominant vessel with luminal irregularities. 4. Left main: Left main had no angiographic disease. 5. Left circumflex system: The left circumflex system was relatively  small with mild luminal irregularities. 6. LAD system: The LAD was a large vessel wrapping around the apex  and supplying a large portion of the PDA territory. There were  minimal luminal irregularities.  Allergies  Allergen Reactions  . Codeine     Headache and "just feel off"  . Penicillins Hives  . Sulfa Antibiotics Hives  . Other Rash    Bleach    Current Outpatient Prescriptions  Medication Sig Dispense Refill  . ACCU-CHEK FASTCLIX LANCETS MISC Use to check blood sugar  3 times per day dx code E11.65 300 each 1  . Alcohol Swabs (B-D SINGLE USE SWABS REGULAR) PADS Use as directed 300 each 1  . ARIPiprazole (ABILIFY) 10 MG tablet Take 10 mg by mouth daily.    Marland Kitchen aspirin EC 81 MG tablet Take 1 tablet (81 mg total) by mouth at bedtime.    Marland Kitchen atorvastatin (LIPITOR) 40 MG tablet TAKE ONE TABLET BY MOUTH IN THE EVENING 30 tablet 3  . Blood Glucose Calibration (ACCU-CHEK SMARTVIEW CONTROL) LIQD Use as directed 1 each 1  . Blood Glucose Monitoring Suppl (ACCU-CHEK NANO SMARTVIEW) W/DEVICE KIT Use to check blood sugar 3 times per day dx code E11.65 1 kit 0  . carbidopa-levodopa (SINEMET IR) 25-100 MG per tablet Take 1 tablet by mouth 3 (three) times daily.     . cholestyramine (QUESTRAN) 4 G packet Take 4 g by mouth 2 (two) times daily as needed.    Marland Kitchen dextromethorphan (DELSYM) 30 MG/5ML liquid Take 15 mg by mouth as needed for cough.    . doxycycline (VIBRAMYCIN) 100 MG capsule Take 100 mg by mouth 2 (two) times daily. Take for 10 Days    . furosemide (LASIX) 40 MG tablet Take 40 mg by mouth every evening.     Marland Kitchen glucose blood (ACCU-CHEK SMARTVIEW) test strip accu-chek  smart view test strips, use to check blood sugars 3 times per day DAW, dx code E11.65 300 each 1  . HYDROcodone-acetaminophen (NORCO/VICODIN) 5-325 MG per tablet Take 1 tablet by mouth every 4 (four) hours as needed for moderate pain.     Marland Kitchen insulin lispro (HUMALOG) 100 UNIT/ML KiwkPen Inject 8 units at breakfast 8 units at lunch and 12 units at supper (Patient taking differently: Inject 6 units at breakfast 6 units at lunch and 10 units at supper) 5 pen 5  . isosorbide mononitrate (IMDUR) 30 MG 24 hr tablet Take 30 mg by mouth daily.     Marland Kitchen lamoTRIgine (LAMICTAL) 150 MG tablet Take 150 mg by mouth daily.     Marland Kitchen LANTUS SOLOSTAR 100 UNIT/ML Solostar Pen INJECT 38 UNITS SUBCUTANEOUSLY ONCE DAILY AT  10  PM (Patient taking differently: INJECT 32 UNITS SUBCUTANEOUSLY ONCE DAILY AT  10  PM) 15 mL 2  . Liraglutide (VICTOZA) 18 MG/3ML SOPN Inject 1.8 daily 3 pen 3  . meloxicam (MOBIC) 15 MG tablet Take 7.5 mg by mouth daily.     . nebivolol (BYSTOLIC) 10 MG tablet Take 10 mg by mouth daily.      Marland Kitchen olmesartan (BENICAR) 40 MG tablet Take 40 mg by mouth daily.    Marland Kitchen omega-3 acid ethyl esters (LOVAZA) 1 G capsule Take 1 g by mouth daily.    . Pancrelipase, Lip-Prot-Amyl, 25000 UNITS CPEP Take 5 capsules by mouth 3 (three) times daily. 2 caps at breakfast, 1 at lunch, 2 at dinner    . potassium chloride SA (K-DUR,KLOR-CON) 20 MEQ tablet Take 20 mEq by mouth daily.      . primidone (MYSOLINE) 50 MG tablet Take 50 mg by mouth at bedtime.     . sertraline (ZOLOFT) 100 MG tablet Take 50 mg by mouth daily.     Marland Kitchen VICTOZA 18 MG/3ML SOPN INJECT 1.8 MG INTO THE SKIN DAILY 9 pen 1   No current facility-administered medications for this visit.    History   Social History  . Marital Status: Divorced    Spouse Name: N/A  . Number of Children: N/A  . Years of Education: N/A  Occupational History  . Disabled    Social History Main Topics  . Smoking status: Former Smoker -- 0.50 packs/day for 25 years    Types:  Cigarettes  . Smokeless tobacco: Never Used     Comment: quit smoking 20+ytrs ago  . Alcohol Use: No  . Drug Use: No  . Sexual Activity: No   Other Topics Concern  . Not on file   Social History Narrative   She lives in [place]. She has lots of family in the area and is accompanied by her sister.   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   She has a distant history of smoking, quit 20 years ago.      family history includes Allergies in her father; Cancer in her other; Diabetes in her brother, mother, sister, and son; Heart disease in her father, mother, and sister; Heart failure in her father; Hyperlipidemia in her father, mother, and sister; Hypertension in her brother, father, mother, sister, sister, and son.  ROS: A comprehensive Review of Systems - was performed Review of Systems  Constitutional: Positive for malaise/fatigue (Has become deconditioned with lack of activity). Negative for weight loss (has actually gained weight).  HENT: Negative for congestion.   Respiratory: Positive for shortness of breath (With exertion). Negative for cough and wheezing.   Cardiovascular: Positive for leg swelling (Mild). Negative for claudication.  Gastrointestinal: Negative for blood in stool and melena.  Genitourinary: Negative for hematuria.  Musculoskeletal: Positive for joint pain (Significant back pain with radiculopathy that limits activity. This also exacerbates her exertional dyspnea.).  Neurological: Negative for headaches.       She did have an episode several months ago where she became hypoglycemic and had a near syncopal event. Otherwise no syncope/near syncope or TIA/amaurosis fugax  Endo/Heme/Allergies: Negative.   All other systems reviewed and are negative.  Wt Readings from Last 3 Encounters:  09/27/14 276 lb 3.2 oz (125.283 kg)  07/09/14 274 lb (124.286 kg)  04/09/14 269 lb (122.018 kg)     PHYSICAL EXAM BP 158/82 mmHg  Pulse 79   Ht 5' 1"  (1.549 m)  Wt 276 lb 3.2 oz (125.283 kg)  BMI 52.21 kg/m2 General appearance: alert, cooperative, appears stated age, no distress, morbidly obese and Nontoxic appearing. Well-groomed. Neck: no adenopathy, no carotid bruit, no JVD and Unable to accurately assess JVP due to body habitus Lungs: clear to auscultation bilaterally, normal percussion bilaterally and Nonlabored, good air movement Heart: Distant heart sounds with normal S1 and S2, RRR. 2/6 SEM at RUSB --> carotids; unable to palpate PMI. No other R./G. Abdomen: soft, non-tender; bowel sounds normal; no masses,  no organomegaly and Morbidly obese, unable to fully evaluate hepatomegaly Extremities: No clubbing cyanosis with mild/trace bilateral edema. Pulses: 2+ and symmetric Skin: Skin color, texture, turgor normal. No rashes or lesions Neurologic: Mental status: Alert, oriented, thought content appropriate Cranial nerves: normal Motor: grossly normal Gait: Somewhat lurching with antalgic features. Clearly has limited mobility with back and hip pains with radiculopathy.   Adult ECG Report  Rate: 79  Rhythm: normal sinus rhythm  QRS Axis: 82  PR Interval: 170 QRS Duration: 88 QTc: 454; Voltages: normal  Conduction Disturbances: none  Other Abnormalities: none  Narrative Interpretation: normal EKG  Recent Labs:    Chemistry      Component Value Date/Time   NA 135 09/21/2014 1127   K 4.4 09/21/2014 1127   CL 100 09/21/2014 1127   CO2 27 09/21/2014  1127   BUN 10 09/21/2014 1127   CREATININE 0.76 09/21/2014 1127   CREATININE 1.0 06/10/2014      Component Value Date/Time   CALCIUM 9.2 09/21/2014 1127   ALKPHOS 92 07/05/2014 1005   AST 14 07/05/2014 1005   ALT 15 07/05/2014 1005   BILITOT 0.5 07/05/2014 1005     Lab Results  Component Value Date   CHOL 190 07/05/2014   HDL 56.80 07/05/2014   LDLCALC 115* 07/05/2014   LDLDIRECT 146.1 05/06/2013   TRIG 93.0 07/05/2014   CHOLHDL 3 07/05/2014    ASSESSMENT /  PLAN:   Problem List Items Addressed This Visit    Chronic diastolic congestive heart failure (Chronic)    There was no evidence of elevated LVEDP by catheterization and minimal diastolic dysfunction by echocardiogram in 2010 2012. Apparently was started on Imdur for afterload reduction along with by mouth Lasix. She is on standing doses with no exacerbation. No PND or orthopnea. The recommendation was to followup echo in one year. We'll therefore check echocardiogram prior to her operation to assess for valves based on her murmur and systolic/diastolic function.      Relevant Medications   cholestyramine (QUESTRAN) 4 G packet   Coronary artery disease, non-occlusive (Chronic)    She had a catheterization in 2009 in 2000 while showing no significant CAD. She had a Myoview done leading to the second catheterization. This suggested probably breast attenuation. I am reluctant to consider stress test for her exertional dyspnea that seems to be chronic as we may very well have a false positive finding. As indicated above, I would not pursue invasive evaluation based on these symptoms in the absence of her going for surgery. Therefore, stress testing would not change management..  Plan: Continue risk factor modification with diabetes and management along with statin, aspirin, beta blocker and ARB. She is also on long-acting nitrate for reasons I am not sure.      Relevant Medications   cholestyramine (QUESTRAN) 4 G packet   Dyslipidemia, goal LDL below 100 (Chronic)    She is on statin. LDL level worsened from 2015 to 2016 along with total cholesterol. This has been followed and managed by her PCP. She has normal LDL and triglycerides therefore this would not be considered part of the metabolic syndrome. Continue statin.  She has gained weight in one year and has become less active with her back pain, combination of obesity and sedentary lifestyle.  Hopefully once she is able to do her operation  she can become more mobile.      Relevant Medications   cholestyramine (QUESTRAN) 4 G packet   Essential hypertension (Chronic)    Borderline control today. She is on a beta blocker and ARB. Therapy would consider amlodipine. This denies viruses PCP and therefore I will not make adjustments based on one reading.      Relevant Medications   cholestyramine (QUESTRAN) 4 G packet   Morbid obesity with BMI of 50.0-59.9, adult (Chronic)    Unfortunately with her back she is unable to exercise. This is probably also the cause of her back issues. We discussed dietary modifications and the need to continually tried to do some exercise it walking or water aerobics etc. Hopefully postoperatively she will be out of rehabilitation and start continued exercise to work for weight loss. Likely will also need nutrition counseling. Would probably try to get into that perioperatively while she is in the hospital and in rehabilitation postop  Preoperative cardiovascular examination    Very pleasant morbidly obese woman with a history of dementia ischemia at mediated MI in 2000 prior. She had a false positive Myoview with likely breast attenuation on with minimal CAD by catheterization. She is relatively asymptomatic from a cardiac standpoint. She does have some exertional dyspnea but does not have any active anginal symptoms. She had been negative ischemic evaluation 4 years ago. At this point I do not think that she requires ischemic evaluation in the absence of anginal symptoms.   Cardiac risk factors include component of metabolic syndrome: Type 2 diabetes, obesity, hypertension, dyslipidemia.  PREOPERATIVE CARDIAC RISK ASSESSMENT   Revised Cardiac Risk Index:  High Risk Surgery: no;   Defined as Intraperitoneal, intrathoracic or suprainguinal vascular  Active CAD: no; and known nonischemic cardiac cath  CHF: no; no suggestion of CHF by echocardiogram in the past. Followup echocardiogram ordered -  no PND orthopnea  Cerebrovascular Disease: no;   Diabetes: yes; On Insulin: yes  CKD (Cr >~ 2): no; creatinine 0.76   Estimated Risk of Adverse Outcome: LOW RISK patient for LOW RISK surgery Estimated Risk of MI, PE, VF/VT (Cardiac Arrest), Complete Heart Block: 0.9 %   ACC/AHA Guidelines for "Clearance":  Step 1 - Need for Emergency Surgery: No:   If Yes - go straight to OR with perioperative surveillance  Step 2 - Active Cardiac Conditions (Unstable Angina, Decompensated HF, Significant  Arrhytmias - Complete HB, Mobitz II, Symptomatic VT or SVT, Severe Aortic Stenosis - mean gradient > 40 mmHg, Valve area < 1.0 cm2):   No:   If Yes - Evaluate & Treat per ACC/AHA Guidelines  Step 3 -  Low Risk Surgery: Yes  If Yes --> proceed to OR  If No --> Step 4  Step 4 - Functional Capacity >= 4 METS without symptoms: No: mostly related to deconditioning and back pain.  If Yes --> proceed to OR  If No --> Step 5  Step 5 --  Clinical Risk Factors (CRF)   1-2 or more CRFs: Yes - 1  If Yes -- assess Surgical Risk, --   (High Risk Non-cardiac), Intraabdominal or thoracic vascular surgery --> Proceed to OR, or consider testing if it will change management.  Intermediate Risk: Proceed to OR with HR control, or consider testing if it will change management  Based upon these guidelines, the patient should be stable to proceed to the OR without any ischemic evaluation in the absence of ongoing cardiac symptoms. I would not otherwise consider PCI or revascularization for her chronic exertional dyspnea, and therefore would not consider an ischemic evaluation simply for the purposes of a non-high-risk surgery.  Recommendation:  Check 2-D echocardiogram earlier this week to reassess valvular function as well as systolic and diastolic function. -- This is simply for risk stratification.   Recommend proceeding to the OR without further ischemic evaluation  Continue beta blocker  perioperatively.  Consider nutrition counseling perioperatively and postop rehabilitation       Relevant Orders   EKG 12-Lead (Completed)   Type II diabetes mellitus, uncontrolled    Lab Results  Component Value Date   HGBA1C 7.3* 07/05/2014    Followed by PCP. On insulin.       Other Visit Diagnoses    Murmur    -  Primary    Relevant Orders    2D Echocardiogram without contrast (Completed)    EKG 12-Lead (Completed)    Dyspnea on exertion  Relevant Orders    2D Echocardiogram without contrast (Completed)    EKG 12-Lead (Completed)       Meds ordered this encounter  Medications  . cholestyramine (QUESTRAN) 4 G packet    Sig: Take 4 g by mouth 2 (two) times daily as needed.    Followup: roughly 3 months - 4 postoperative followup  DAVID W. Ellyn Hack, M.D., M.S. Interventional Cardiolgy [facility]

## 2014-09-28 NOTE — Telephone Encounter (Signed)
Returning your call. °

## 2014-09-28 NOTE — Progress Notes (Signed)
Quick Note:  Echo results: Good news: Essentially normal echocardiogram and normal pump function and normal valve function. No regional wall motion abnormalities = No signs to suggest heart attack.. EF: 55-60%. Significant valve lesions to suggest a worrisome reason for the murmur. Probably has mild calcification the aortic valve called aortic sclerosis 3 there is no sign of narrowing. This is a benign murmur  I will incorporate this data in her clinic note as far as clearance for her procedure. [place]    ______

## 2014-09-28 NOTE — Progress Notes (Signed)
   ADDENDUM TO INITIAL CONSULTATION NOTE:   Echocardiogram Conclusions: Abnormal septal motion The cavity size wasnormal. Systolic function was normal. The estimated ejection fraction was in the range of 55% to 60%. Wall motion was normal; there were no regional wall motion abnormalities. Left ventricular diastolic function parameters were normal. - Left atrium: The atrium was mildly to moderately dilated. - Right atrium: The atrium was mildly dilated. - Atrial septum: Redundant and mobile atrial septum with no obvious PFO. - Mild Aortic Valve thickening, but no Stenosis (? Sclerosis)   Based on these findings, the patient is relatively stable echocardiographic results. No regional wall motion abnormality is to suggest coronary disease, relatively normal aortic valve findings with no evidence of stenosis. Murmur is probably related to aortic sclerosis with turbulent flow.  Based on these results, there should be no reason for her to AA proceeding to the operating room for her needed back surgery. Further evaluation can be done postop if she continues to have worsening exertional dyspnea symptoms in the absence of significant back pain.    Leonie Man, M.D., M.S. Interventional Cardiologist   Pager # [phone]

## 2014-09-29 NOTE — Assessment & Plan Note (Signed)
She had a catheterization in 2009 in 2000 while showing no significant CAD. She had a Myoview done leading to the second catheterization. This suggested probably breast attenuation. I am reluctant to consider stress test for her exertional dyspnea that seems to be chronic as we may very well have a false positive finding. As indicated above, I would not pursue invasive evaluation based on these symptoms in the absence of her going for surgery. Therefore, stress testing would not change management..  Plan: Continue risk factor modification with diabetes and management along with statin, aspirin, beta blocker and ARB. She is also on long-acting nitrate for reasons I am not sure.

## 2014-09-29 NOTE — Assessment & Plan Note (Signed)
Unfortunately with her back she is unable to exercise. This is probably also the cause of her back issues. We discussed dietary modifications and the need to continually tried to do some exercise it walking or water aerobics etc. Hopefully postoperatively she will be out of rehabilitation and start continued exercise to work for weight loss. Likely will also need nutrition counseling. Would probably try to get into that perioperatively while she is in the hospital and in rehabilitation postop

## 2014-09-29 NOTE — Assessment & Plan Note (Signed)
There was no evidence of elevated LVEDP by catheterization and minimal diastolic dysfunction by echocardiogram in 2010 2012. Apparently was started on Imdur for afterload reduction along with by mouth Lasix. She is on standing doses with no exacerbation. No PND or orthopnea. The recommendation was to followup echo in one year. We'll therefore check echocardiogram prior to her operation to assess for valves based on her murmur and systolic/diastolic function.

## 2014-09-29 NOTE — Telephone Encounter (Signed)
Spoke to patient. Result given . Verbalized understanding Patient aware she is cleared for surgery.

## 2014-09-29 NOTE — Telephone Encounter (Signed)
Left message to call back.she has clearance for lumbar surgery and echo results

## 2014-09-29 NOTE — Assessment & Plan Note (Signed)
Borderline control today. She is on a beta blocker and ARB. Therapy would consider amlodipine. This denies viruses PCP and therefore I will not make adjustments based on one reading.

## 2014-09-29 NOTE — Assessment & Plan Note (Addendum)
Very pleasant morbidly obese woman with a history of dementia ischemia at mediated MI in 2000 prior. She had a false positive Myoview with likely breast attenuation on with minimal CAD by catheterization. She is relatively asymptomatic from a cardiac standpoint. She does have some exertional dyspnea but does not have any active anginal symptoms. She had been negative ischemic evaluation 4 years ago. At this point I do not think that she requires ischemic evaluation in the absence of anginal symptoms.   Cardiac risk factors include component of metabolic syndrome: Type 2 diabetes, obesity, hypertension, dyslipidemia.  PREOPERATIVE CARDIAC RISK ASSESSMENT   Revised Cardiac Risk Index:  High Risk Surgery: no;   Defined as Intraperitoneal, intrathoracic or suprainguinal vascular  Active CAD: no; and known nonischemic cardiac cath  CHF: no; no suggestion of CHF by echocardiogram in the past. Followup echocardiogram ordered - no PND orthopnea  Cerebrovascular Disease: no;   Diabetes: yes; On Insulin: yes  CKD (Cr >~ 2): no; creatinine 0.76   Estimated Risk of Adverse Outcome: LOW RISK patient for LOW RISK surgery Estimated Risk of MI, PE, VF/VT (Cardiac Arrest), Complete Heart Block: 0.9 %   ACC/AHA Guidelines for "Clearance":  Step 1 - Need for Emergency Surgery: No:   If Yes - go straight to OR with perioperative surveillance  Step 2 - Active Cardiac Conditions (Unstable Angina, Decompensated HF, Significant  Arrhytmias - Complete HB, Mobitz II, Symptomatic VT or SVT, Severe Aortic Stenosis - mean gradient > 40 mmHg, Valve area < 1.0 cm2):   No:   If Yes - Evaluate & Treat per ACC/AHA Guidelines  Step 3 -  Low Risk Surgery: Yes  If Yes --> proceed to OR  If No --> Step 4  Step 4 - Functional Capacity >= 4 METS without symptoms: No: mostly related to deconditioning and back pain.  If Yes --> proceed to OR  If No --> Step 5  Step 5 --  Clinical Risk Factors (CRF)   1-2  or more CRFs: Yes - 1  If Yes -- assess Surgical Risk, --   (High Risk Non-cardiac), Intraabdominal or thoracic vascular surgery --> Proceed to OR, or consider testing if it will change management.  Intermediate Risk: Proceed to OR with HR control, or consider testing if it will change management  Based upon these guidelines, the patient should be stable to proceed to the OR without any ischemic evaluation in the absence of ongoing cardiac symptoms. I would not otherwise consider PCI or revascularization for her chronic exertional dyspnea, and therefore would not consider an ischemic evaluation simply for the purposes of a non-high-risk surgery.  Recommendation:  Check 2-D echocardiogram earlier this week to reassess valvular function as well as systolic and diastolic function. -- This is simply for risk stratification.   Recommend proceeding to the OR without further ischemic evaluation  Continue beta blocker perioperatively.  Consider nutrition counseling perioperatively and postop rehabilitation

## 2014-09-29 NOTE — Assessment & Plan Note (Signed)
Lab Results  Component Value Date   HGBA1C 7.3* 07/05/2014    Followed by PCP. On insulin.

## 2014-09-29 NOTE — Assessment & Plan Note (Signed)
She is on statin. LDL level worsened from 2015 to 2016 along with total cholesterol. This has been followed and managed by her PCP. She has normal LDL and triglycerides therefore this would not be considered part of the metabolic syndrome. Continue statin.  She has gained weight in one year and has become less active with her back pain, combination of obesity and sedentary lifestyle.  Hopefully once she is able to do her operation she can become more mobile.

## 2014-09-30 ENCOUNTER — Telehealth: Payer: Self-pay | Admitting: *Deleted

## 2014-09-30 NOTE — Telephone Encounter (Signed)
RN called and left message Faxed office note 09/27/14 and addenum  clearance for lumbar surgery by Dr Saintclair Halsted schedule for 10/04/14.

## 2014-10-03 MED ORDER — DEXAMETHASONE SODIUM PHOSPHATE 10 MG/ML IJ SOLN
10.0000 mg | INTRAMUSCULAR | Status: AC
  Administered 2014-10-04: 10 mg via INTRAVENOUS
  Filled 2014-10-03: qty 1

## 2014-10-03 MED ORDER — VANCOMYCIN HCL 10 G IV SOLR
1500.0000 mg | INTRAVENOUS | Status: AC
  Administered 2014-10-04: 1500 mg via INTRAVENOUS
  Filled 2014-10-03: qty 1500

## 2014-10-04 ENCOUNTER — Inpatient Hospital Stay (HOSPITAL_COMMUNITY)
Admission: RE | Admit: 2014-10-04 | Discharge: 2014-10-07 | DRG: 460 | Disposition: A | Discharge status: Skilled Nursing Facility | Payer: Commercial Managed Care - HMO | Source: Ambulatory Visit | Attending: Neurosurgery | Admitting: Neurosurgery

## 2014-10-04 ENCOUNTER — Inpatient Hospital Stay (HOSPITAL_COMMUNITY): Payer: Commercial Managed Care - HMO | Admitting: Emergency Medicine

## 2014-10-04 ENCOUNTER — Encounter (HOSPITAL_COMMUNITY): Payer: Self-pay | Admitting: *Deleted

## 2014-10-04 ENCOUNTER — Encounter (HOSPITAL_COMMUNITY)
Admission: RE | Disposition: A | Discharge status: Skilled Nursing Facility | Payer: Commercial Managed Care - HMO | Source: Ambulatory Visit | Attending: Neurosurgery

## 2014-10-04 ENCOUNTER — Inpatient Hospital Stay (HOSPITAL_COMMUNITY): Payer: Commercial Managed Care - HMO | Admitting: Anesthesiology

## 2014-10-04 ENCOUNTER — Inpatient Hospital Stay (HOSPITAL_COMMUNITY): Payer: Commercial Managed Care - HMO

## 2014-10-04 DIAGNOSIS — D649 Anemia, unspecified: Secondary | ICD-10-CM | POA: Diagnosis not present

## 2014-10-04 DIAGNOSIS — Z880 Allergy status to penicillin: Secondary | ICD-10-CM

## 2014-10-04 DIAGNOSIS — M5126 Other intervertebral disc displacement, lumbar region: Secondary | ICD-10-CM | POA: Diagnosis not present

## 2014-10-04 DIAGNOSIS — Z91048 Other nonmedicinal substance allergy status: Secondary | ICD-10-CM | POA: Diagnosis not present

## 2014-10-04 DIAGNOSIS — I10 Essential (primary) hypertension: Secondary | ICD-10-CM | POA: Diagnosis present

## 2014-10-04 DIAGNOSIS — Z794 Long term (current) use of insulin: Secondary | ICD-10-CM | POA: Diagnosis not present

## 2014-10-04 DIAGNOSIS — G4733 Obstructive sleep apnea (adult) (pediatric): Secondary | ICD-10-CM | POA: Diagnosis present

## 2014-10-04 DIAGNOSIS — I2510 Atherosclerotic heart disease of native coronary artery without angina pectoris: Secondary | ICD-10-CM | POA: Diagnosis present

## 2014-10-04 DIAGNOSIS — M5136 Other intervertebral disc degeneration, lumbar region: Secondary | ICD-10-CM | POA: Diagnosis not present

## 2014-10-04 DIAGNOSIS — M5416 Radiculopathy, lumbar region: Secondary | ICD-10-CM | POA: Diagnosis not present

## 2014-10-04 DIAGNOSIS — E119 Type 2 diabetes mellitus without complications: Secondary | ICD-10-CM | POA: Diagnosis not present

## 2014-10-04 DIAGNOSIS — G20 Parkinson's disease: Secondary | ICD-10-CM | POA: Diagnosis not present

## 2014-10-04 DIAGNOSIS — R600 Localized edema: Secondary | ICD-10-CM | POA: Diagnosis present

## 2014-10-04 DIAGNOSIS — G40909 Epilepsy, unspecified, not intractable, without status epilepticus: Secondary | ICD-10-CM | POA: Diagnosis present

## 2014-10-04 DIAGNOSIS — E785 Hyperlipidemia, unspecified: Secondary | ICD-10-CM | POA: Diagnosis present

## 2014-10-04 DIAGNOSIS — Z885 Allergy status to narcotic agent status: Secondary | ICD-10-CM | POA: Diagnosis not present

## 2014-10-04 DIAGNOSIS — R41841 Cognitive communication deficit: Secondary | ICD-10-CM | POA: Diagnosis not present

## 2014-10-04 DIAGNOSIS — E871 Hypo-osmolality and hyponatremia: Secondary | ICD-10-CM | POA: Diagnosis present

## 2014-10-04 DIAGNOSIS — M4806 Spinal stenosis, lumbar region: Principal | ICD-10-CM | POA: Diagnosis present

## 2014-10-04 DIAGNOSIS — F329 Major depressive disorder, single episode, unspecified: Secondary | ICD-10-CM | POA: Diagnosis present

## 2014-10-04 DIAGNOSIS — Z9889 Other specified postprocedural states: Secondary | ICD-10-CM | POA: Diagnosis not present

## 2014-10-04 DIAGNOSIS — R262 Difficulty in walking, not elsewhere classified: Secondary | ICD-10-CM | POA: Diagnosis not present

## 2014-10-04 DIAGNOSIS — I5032 Chronic diastolic (congestive) heart failure: Secondary | ICD-10-CM | POA: Diagnosis present

## 2014-10-04 DIAGNOSIS — Z881 Allergy status to other antibiotic agents status: Secondary | ICD-10-CM | POA: Diagnosis not present

## 2014-10-04 DIAGNOSIS — I252 Old myocardial infarction: Secondary | ICD-10-CM | POA: Diagnosis not present

## 2014-10-04 DIAGNOSIS — M549 Dorsalgia, unspecified: Secondary | ICD-10-CM | POA: Diagnosis not present

## 2014-10-04 DIAGNOSIS — Z87891 Personal history of nicotine dependence: Secondary | ICD-10-CM | POA: Diagnosis not present

## 2014-10-04 DIAGNOSIS — Z7982 Long term (current) use of aspirin: Secondary | ICD-10-CM | POA: Diagnosis not present

## 2014-10-04 DIAGNOSIS — Z79899 Other long term (current) drug therapy: Secondary | ICD-10-CM | POA: Diagnosis not present

## 2014-10-04 DIAGNOSIS — Z419 Encounter for procedure for purposes other than remedying health state, unspecified: Secondary | ICD-10-CM

## 2014-10-04 DIAGNOSIS — M48061 Spinal stenosis, lumbar region without neurogenic claudication: Secondary | ICD-10-CM | POA: Diagnosis present

## 2014-10-04 DIAGNOSIS — M4807 Spinal stenosis, lumbosacral region: Secondary | ICD-10-CM | POA: Diagnosis not present

## 2014-10-04 DIAGNOSIS — M47816 Spondylosis without myelopathy or radiculopathy, lumbar region: Secondary | ICD-10-CM | POA: Diagnosis present

## 2014-10-04 DIAGNOSIS — Z981 Arthrodesis status: Secondary | ICD-10-CM | POA: Diagnosis not present

## 2014-10-04 DIAGNOSIS — M6281 Muscle weakness (generalized): Secondary | ICD-10-CM | POA: Diagnosis not present

## 2014-10-04 DIAGNOSIS — M4326 Fusion of spine, lumbar region: Secondary | ICD-10-CM | POA: Diagnosis not present

## 2014-10-04 LAB — GLUCOSE, CAPILLARY
Glucose-Capillary: 133 mg/dL — ABNORMAL HIGH (ref 70–99)
Glucose-Capillary: 177 mg/dL — ABNORMAL HIGH (ref 70–99)
Glucose-Capillary: 213 mg/dL — ABNORMAL HIGH (ref 70–99)
Glucose-Capillary: 226 mg/dL — ABNORMAL HIGH (ref 70–99)
Glucose-Capillary: 139 mg/dL — ABNORMAL HIGH (ref 70–99)

## 2014-10-04 SURGERY — POSTERIOR LUMBAR FUSION 2 LEVEL
Anesthesia: General | Site: Spine Lumbar

## 2014-10-04 MED ORDER — SUCCINYLCHOLINE CHLORIDE 20 MG/ML IJ SOLN
INTRAMUSCULAR | Status: AC
  Filled 2014-10-04: qty 1

## 2014-10-04 MED ORDER — LIDOCAINE-EPINEPHRINE 1 %-1:100000 IJ SOLN
INTRAMUSCULAR | Status: DC | PRN
  Administered 2014-10-04: 10 mL

## 2014-10-04 MED ORDER — HYDROMORPHONE HCL 1 MG/ML IJ SOLN
INTRAMUSCULAR | Status: AC
  Filled 2014-10-04: qty 1

## 2014-10-04 MED ORDER — GLYCOPYRROLATE 0.2 MG/ML IJ SOLN
INTRAMUSCULAR | Status: DC | PRN
  Administered 2014-10-04: .8 mg via INTRAVENOUS

## 2014-10-04 MED ORDER — PANCRELIPASE (LIP-PROT-AMYL) 25000 UNITS PO CPEP: 5.0000 | ORAL_CAPSULE | Freq: Three times a day (TID) | ORAL | Status: DC

## 2014-10-04 MED ORDER — DEXTROMETHORPHAN POLISTIREX 30 MG/5ML PO LQCR
15.0000 mg | Freq: Two times a day (BID) | ORAL | Status: DC | PRN
  Filled 2014-10-04: qty 5

## 2014-10-04 MED ORDER — PHENYLEPHRINE 40 MCG/ML (10ML) SYRINGE FOR IV PUSH (FOR BLOOD PRESSURE SUPPORT)
PREFILLED_SYRINGE | INTRAVENOUS | Status: AC
  Filled 2014-10-04: qty 10

## 2014-10-04 MED ORDER — SODIUM CHLORIDE 0.9 % IV SOLN
10.0000 mg | INTRAVENOUS | Status: DC | PRN
  Administered 2014-10-04: 50 ug/min via INTRAVENOUS

## 2014-10-04 MED ORDER — ARTIFICIAL TEARS OP OINT
TOPICAL_OINTMENT | OPHTHALMIC | Status: DC | PRN
  Administered 2014-10-04: 1 via OPHTHALMIC

## 2014-10-04 MED ORDER — DOCUSATE SODIUM 100 MG PO CAPS
100.0000 mg | ORAL_CAPSULE | Freq: Two times a day (BID) | ORAL | Status: DC
  Administered 2014-10-04 – 2014-10-06 (×6): 100 mg via ORAL
  Filled 2014-10-04 (×6): qty 1

## 2014-10-04 MED ORDER — NEOSTIGMINE METHYLSULFATE 10 MG/10ML IV SOLN
INTRAVENOUS | Status: DC | PRN
  Administered 2014-10-04: 5 mg via INTRAVENOUS

## 2014-10-04 MED ORDER — HYDROCODONE-ACETAMINOPHEN 5-325 MG PO TABS: 1.0000 | ORAL_TABLET | ORAL | Status: DC | PRN

## 2014-10-04 MED ORDER — MIDAZOLAM HCL 2 MG/2ML IJ SOLN
INTRAMUSCULAR | Status: AC
  Filled 2014-10-04: qty 2

## 2014-10-04 MED ORDER — CYCLOBENZAPRINE HCL 10 MG PO TABS
10.0000 mg | ORAL_TABLET | Freq: Three times a day (TID) | ORAL | Status: DC | PRN
  Administered 2014-10-04 – 2014-10-07 (×7): 10 mg via ORAL
  Filled 2014-10-04 (×6): qty 1

## 2014-10-04 MED ORDER — PHENYLEPHRINE HCL 10 MG/ML IJ SOLN
INTRAMUSCULAR | Status: DC | PRN
  Administered 2014-10-04: 80 ug via INTRAVENOUS

## 2014-10-04 MED ORDER — INSULIN ASPART 100 UNIT/ML ~~LOC~~ SOLN
0.0000 [IU] | Freq: Every day | SUBCUTANEOUS | Status: DC
Start: 2014-10-04 — End: 2014-10-07
  Administered 2014-10-05: 3 [IU] via SUBCUTANEOUS

## 2014-10-04 MED ORDER — PHENOL 1.4 % MT LIQD
1.0000 | OROMUCOSAL | Status: DC | PRN
  Administered 2014-10-06: 1 via OROMUCOSAL

## 2014-10-04 MED ORDER — VECURONIUM BROMIDE 10 MG IV SOLR
INTRAVENOUS | Status: AC
  Filled 2014-10-04: qty 10

## 2014-10-04 MED ORDER — 0.9 % SODIUM CHLORIDE (POUR BTL) OPTIME
TOPICAL | Status: DC | PRN
  Administered 2014-10-04: 1000 mL

## 2014-10-04 MED ORDER — PROPOFOL 10 MG/ML IV BOLUS
INTRAVENOUS | Status: AC
  Filled 2014-10-04: qty 20

## 2014-10-04 MED ORDER — EPHEDRINE SULFATE 50 MG/ML IJ SOLN
INTRAMUSCULAR | Status: DC | PRN
  Administered 2014-10-04 (×2): 10 mg via INTRAVENOUS

## 2014-10-04 MED ORDER — SODIUM CHLORIDE 0.9 % IJ SOLN: 3.0000 mL | INTRAMUSCULAR | Status: DC | PRN

## 2014-10-04 MED ORDER — ROCURONIUM BROMIDE 50 MG/5ML IV SOLN
INTRAVENOUS | Status: AC
  Filled 2014-10-04: qty 1

## 2014-10-04 MED ORDER — SODIUM CHLORIDE 0.9 % IJ SOLN
INTRAMUSCULAR | Status: AC
  Filled 2014-10-04: qty 10

## 2014-10-04 MED ORDER — CARBIDOPA-LEVODOPA 25-100 MG PO TABS
1.0000 | ORAL_TABLET | Freq: Three times a day (TID) | ORAL | Status: DC
  Administered 2014-10-04 – 2014-10-07 (×9): 1 via ORAL
  Filled 2014-10-04 (×9): qty 1

## 2014-10-04 MED ORDER — VANCOMYCIN HCL IN DEXTROSE 1-5 GM/200ML-% IV SOLN
1000.0000 mg | Freq: Two times a day (BID) | INTRAVENOUS | Status: DC
  Administered 2014-10-04 – 2014-10-06 (×4): 1000 mg via INTRAVENOUS
  Filled 2014-10-04 (×5): qty 200

## 2014-10-04 MED ORDER — OXYCODONE-ACETAMINOPHEN 5-325 MG PO TABS
1.0000 | ORAL_TABLET | ORAL | Status: DC | PRN
  Administered 2014-10-04 – 2014-10-07 (×6): 2 via ORAL
  Filled 2014-10-04 (×8): qty 2

## 2014-10-04 MED ORDER — GLYCOPYRROLATE 0.2 MG/ML IJ SOLN
INTRAMUSCULAR | Status: AC
  Filled 2014-10-04: qty 5

## 2014-10-04 MED ORDER — PROMETHAZINE HCL 25 MG/ML IJ SOLN: 6.2500 mg | INTRAMUSCULAR | Status: DC | PRN

## 2014-10-04 MED ORDER — PANCRELIPASE (LIP-PROT-AMYL) 12000-38000 UNITS PO CPEP
48000.0000 [IU] | ORAL_CAPSULE | Freq: Two times a day (BID) | ORAL | Status: DC
  Administered 2014-10-04 – 2014-10-06 (×5): 48000 [IU] via ORAL
  Filled 2014-10-04 (×7): qty 4

## 2014-10-04 MED ORDER — FUROSEMIDE 40 MG PO TABS
40.0000 mg | ORAL_TABLET | Freq: Every evening | ORAL | Status: DC
  Administered 2014-10-05 – 2014-10-06 (×2): 40 mg via ORAL
  Filled 2014-10-04 (×3): qty 1

## 2014-10-04 MED ORDER — ONDANSETRON HCL 4 MG/2ML IJ SOLN
INTRAMUSCULAR | Status: DC | PRN
  Administered 2014-10-04: 4 mg via INTRAVENOUS

## 2014-10-04 MED ORDER — VECURONIUM BROMIDE 10 MG IV SOLR
INTRAVENOUS | Status: DC | PRN
  Administered 2014-10-04 (×5): 2 mg via INTRAVENOUS

## 2014-10-04 MED ORDER — ATORVASTATIN CALCIUM 40 MG PO TABS
40.0000 mg | ORAL_TABLET | Freq: Every evening | ORAL | Status: DC
  Administered 2014-10-04 – 2014-10-06 (×3): 40 mg via ORAL
  Filled 2014-10-04 (×3): qty 1

## 2014-10-04 MED ORDER — DOXYCYCLINE HYCLATE 100 MG PO TABS: 100.0000 mg | ORAL_TABLET | Freq: Two times a day (BID) | ORAL | Status: DC

## 2014-10-04 MED ORDER — CYCLOBENZAPRINE HCL 10 MG PO TABS
ORAL_TABLET | ORAL | Status: AC
Start: 2014-10-04 — End: 2014-10-05
  Filled 2014-10-04: qty 1

## 2014-10-04 MED ORDER — SUCCINYLCHOLINE CHLORIDE 20 MG/ML IJ SOLN
INTRAMUSCULAR | Status: DC | PRN
  Administered 2014-10-04: 140 mg via INTRAVENOUS

## 2014-10-04 MED ORDER — CHOLESTYRAMINE 4 G PO PACK
4.0000 g | PACK | Freq: Four times a day (QID) | ORAL | Status: DC
  Administered 2014-10-05 – 2014-10-07 (×11): 4 g via ORAL
  Filled 2014-10-04 (×15): qty 1

## 2014-10-04 MED ORDER — LIDOCAINE HCL (CARDIAC) 20 MG/ML IV SOLN
INTRAVENOUS | Status: DC | PRN
  Administered 2014-10-04: 75 mg via INTRAVENOUS

## 2014-10-04 MED ORDER — PROPOFOL 10 MG/ML IV BOLUS
INTRAVENOUS | Status: DC | PRN
  Administered 2014-10-04: 200 mg via INTRAVENOUS

## 2014-10-04 MED ORDER — ONDANSETRON HCL 4 MG/2ML IJ SOLN
INTRAMUSCULAR | Status: AC
  Filled 2014-10-04: qty 2

## 2014-10-04 MED ORDER — HYDROMORPHONE HCL 1 MG/ML IJ SOLN
0.2500 mg | INTRAMUSCULAR | Status: DC | PRN
  Administered 2014-10-04 (×3): 0.5 mg via INTRAVENOUS

## 2014-10-04 MED ORDER — ACETAMINOPHEN 325 MG PO TABS: 650.0000 mg | ORAL_TABLET | ORAL | Status: DC | PRN

## 2014-10-04 MED ORDER — PANCRELIPASE (LIP-PROT-AMYL) 12000-38000 UNITS PO CPEP
24000.0000 [IU] | ORAL_CAPSULE | Freq: Every day | ORAL | Status: DC
  Administered 2014-10-05 – 2014-10-06 (×2): 24000 [IU] via ORAL
  Filled 2014-10-04 (×2): qty 2

## 2014-10-04 MED ORDER — SODIUM CHLORIDE 0.9 % IV SOLN: 250.0000 mL | INTRAVENOUS | Status: DC

## 2014-10-04 MED ORDER — HYDROMORPHONE HCL 1 MG/ML IJ SOLN
0.5000 mg | INTRAMUSCULAR | Status: DC | PRN
  Administered 2014-10-04 – 2014-10-06 (×11): 1 mg via INTRAVENOUS
  Filled 2014-10-04 (×11): qty 1

## 2014-10-04 MED ORDER — ONDANSETRON HCL 4 MG/2ML IJ SOLN
4.0000 mg | INTRAMUSCULAR | Status: DC | PRN
  Administered 2014-10-07: 4 mg via INTRAVENOUS
  Filled 2014-10-04: qty 2

## 2014-10-04 MED ORDER — SODIUM CHLORIDE 0.9 % IJ SOLN
3.0000 mL | Freq: Two times a day (BID) | INTRAMUSCULAR | Status: DC
  Administered 2014-10-04 – 2014-10-06 (×4): 3 mL via INTRAVENOUS

## 2014-10-04 MED ORDER — SENNOSIDES-DOCUSATE SODIUM 8.6-50 MG PO TABS
1.0000 | ORAL_TABLET | Freq: Every evening | ORAL | Status: DC | PRN
Start: 2014-10-04 — End: 2014-10-07

## 2014-10-04 MED ORDER — OMEGA-3-ACID ETHYL ESTERS 1 G PO CAPS
1.0000 g | ORAL_CAPSULE | Freq: Every day | ORAL | Status: DC
  Administered 2014-10-05 – 2014-10-06 (×2): 1 g via ORAL
  Filled 2014-10-04 (×3): qty 1

## 2014-10-04 MED ORDER — ASPIRIN EC 81 MG PO TBEC
81.0000 mg | DELAYED_RELEASE_TABLET | Freq: Every day | ORAL | Status: DC
  Administered 2014-10-04 – 2014-10-06 (×3): 81 mg via ORAL
  Filled 2014-10-04 (×3): qty 1

## 2014-10-04 MED ORDER — VANCOMYCIN HCL 1000 MG IV SOLR
INTRAVENOUS | Status: DC | PRN
  Administered 2014-10-04: 1000 mg via TOPICAL

## 2014-10-04 MED ORDER — INSULIN ASPART 100 UNIT/ML ~~LOC~~ SOLN
0.0000 [IU] | Freq: Three times a day (TID) | SUBCUTANEOUS | Status: DC
  Administered 2014-10-04 – 2014-10-05 (×2): 7 [IU] via SUBCUTANEOUS
  Administered 2014-10-05 – 2014-10-06 (×3): 4 [IU] via SUBCUTANEOUS
  Administered 2014-10-07: 3 [IU] via SUBCUTANEOUS

## 2014-10-04 MED ORDER — MELOXICAM 7.5 MG PO TABS
7.5000 mg | ORAL_TABLET | Freq: Every day | ORAL | Status: DC
  Administered 2014-10-05 – 2014-10-06 (×2): 7.5 mg via ORAL
  Filled 2014-10-04 (×3): qty 1

## 2014-10-04 MED ORDER — FENTANYL CITRATE 0.05 MG/ML IJ SOLN
INTRAMUSCULAR | Status: AC
  Filled 2014-10-04: qty 5

## 2014-10-04 MED ORDER — NEOSTIGMINE METHYLSULFATE 10 MG/10ML IV SOLN
INTRAVENOUS | Status: AC
  Filled 2014-10-04: qty 1

## 2014-10-04 MED ORDER — ACETAMINOPHEN 650 MG RE SUPP: 650.0000 mg | RECTAL | Status: DC | PRN

## 2014-10-04 MED ORDER — PRIMIDONE 50 MG PO TABS
50.0000 mg | ORAL_TABLET | Freq: Every day | ORAL | Status: DC
  Administered 2014-10-04 – 2014-10-06 (×3): 50 mg via ORAL
  Filled 2014-10-04 (×5): qty 1

## 2014-10-04 MED ORDER — SODIUM CHLORIDE 0.9 % IR SOLN
Status: DC | PRN
  Administered 2014-10-04: 500 mL

## 2014-10-04 MED ORDER — EPHEDRINE SULFATE 50 MG/ML IJ SOLN
INTRAMUSCULAR | Status: AC
  Filled 2014-10-04: qty 1

## 2014-10-04 MED ORDER — SERTRALINE HCL 50 MG PO TABS
50.0000 mg | ORAL_TABLET | Freq: Every day | ORAL | Status: DC
  Administered 2014-10-04 – 2014-10-06 (×3): 50 mg via ORAL
  Filled 2014-10-04 (×3): qty 1

## 2014-10-04 MED ORDER — ARIPIPRAZOLE 10 MG PO TABS
10.0000 mg | ORAL_TABLET | Freq: Every day | ORAL | Status: DC
  Administered 2014-10-05 – 2014-10-06 (×2): 10 mg via ORAL
  Filled 2014-10-04 (×2): qty 1

## 2014-10-04 MED ORDER — NEBIVOLOL HCL 10 MG PO TABS
10.0000 mg | ORAL_TABLET | Freq: Every day | ORAL | Status: DC
  Administered 2014-10-05 – 2014-10-06 (×2): 10 mg via ORAL
  Filled 2014-10-04 (×4): qty 1

## 2014-10-04 MED ORDER — LIDOCAINE HCL (CARDIAC) 20 MG/ML IV SOLN
INTRAVENOUS | Status: AC
  Filled 2014-10-04: qty 5

## 2014-10-04 MED ORDER — ISOSORBIDE MONONITRATE ER 30 MG PO TB24
30.0000 mg | ORAL_TABLET | Freq: Every day | ORAL | Status: DC
  Administered 2014-10-04 – 2014-10-06 (×3): 30 mg via ORAL
  Filled 2014-10-04 (×3): qty 1

## 2014-10-04 MED ORDER — LAMOTRIGINE 100 MG PO TABS
150.0000 mg | ORAL_TABLET | Freq: Every day | ORAL | Status: DC
  Administered 2014-10-04 – 2014-10-06 (×3): 150 mg via ORAL
  Filled 2014-10-04 (×3): qty 2

## 2014-10-04 MED ORDER — SODIUM CHLORIDE 0.9 % IV SOLN
INTRAVENOUS | Status: DC | PRN
  Administered 2014-10-04: 07:00:00 via INTRAVENOUS

## 2014-10-04 MED ORDER — POTASSIUM CHLORIDE CRYS ER 20 MEQ PO TBCR
20.0000 meq | EXTENDED_RELEASE_TABLET | Freq: Every day | ORAL | Status: DC
  Administered 2014-10-05 – 2014-10-06 (×2): 20 meq via ORAL
  Filled 2014-10-04 (×3): qty 1

## 2014-10-04 MED ORDER — VANCOMYCIN HCL 1000 MG IV SOLR
INTRAVENOUS | Status: AC
  Filled 2014-10-04: qty 1000

## 2014-10-04 MED ORDER — STERILE WATER FOR INJECTION IJ SOLN
INTRAMUSCULAR | Status: AC
  Filled 2014-10-04: qty 10

## 2014-10-04 MED ORDER — DOXYCYCLINE HYCLATE 100 MG PO CAPS: 100.0000 mg | ORAL_CAPSULE | Freq: Two times a day (BID) | ORAL | Status: DC

## 2014-10-04 MED ORDER — ARTIFICIAL TEARS OP OINT
TOPICAL_OINTMENT | OPHTHALMIC | Status: AC
  Filled 2014-10-04: qty 3.5

## 2014-10-04 MED ORDER — IRBESARTAN 75 MG PO TABS
37.5000 mg | ORAL_TABLET | Freq: Every day | ORAL | Status: DC
  Administered 2014-10-05 – 2014-10-06 (×2): 37.5 mg via ORAL
  Filled 2014-10-04 (×4): qty 0.5

## 2014-10-04 MED ORDER — FENTANYL CITRATE 0.05 MG/ML IJ SOLN
INTRAMUSCULAR | Status: DC | PRN
  Administered 2014-10-04 (×2): 50 ug via INTRAVENOUS
  Administered 2014-10-04: 100 ug via INTRAVENOUS
  Administered 2014-10-04: 50 ug via INTRAVENOUS

## 2014-10-04 MED ORDER — MIDAZOLAM HCL 5 MG/5ML IJ SOLN
INTRAMUSCULAR | Status: DC | PRN
  Administered 2014-10-04: 2 mg via INTRAVENOUS

## 2014-10-04 MED ORDER — ROCURONIUM BROMIDE 100 MG/10ML IV SOLN
INTRAVENOUS | Status: DC | PRN
  Administered 2014-10-04: 50 mg via INTRAVENOUS

## 2014-10-04 MED ORDER — INSULIN ASPART 100 UNIT/ML ~~LOC~~ SOLN
6.0000 [IU] | Freq: Three times a day (TID) | SUBCUTANEOUS | Status: DC
  Administered 2014-10-04 – 2014-10-07 (×8): 6 [IU] via SUBCUTANEOUS

## 2014-10-04 MED ORDER — LACTATED RINGERS IV SOLN
INTRAVENOUS | Status: DC | PRN
  Administered 2014-10-04 (×2): via INTRAVENOUS

## 2014-10-04 MED ORDER — ALBUMIN HUMAN 5 % IV SOLN
INTRAVENOUS | Status: DC | PRN
  Administered 2014-10-04: 10:00:00 via INTRAVENOUS

## 2014-10-04 MED ORDER — THROMBIN 20000 UNITS EX SOLR
CUTANEOUS | Status: DC | PRN
  Administered 2014-10-04 (×2): 20 mL via TOPICAL

## 2014-10-04 MED ORDER — MENTHOL 3 MG MT LOZG
1.0000 | LOZENGE | OROMUCOSAL | Status: DC | PRN
Start: 2014-10-04 — End: 2014-10-07
  Filled 2014-10-04: qty 9

## 2014-10-04 SURGICAL SUPPLY — 86 items
ADH SKN CLS LQ APL DERMABOND (GAUZE/BANDAGES/DRESSINGS) ×1
ALLOSTEM STRIP 20MMX50MM (Tissue) ×2 IMPLANT
APL SKNCLS STERI-STRIP NONHPOA (GAUZE/BANDAGES/DRESSINGS) ×1
BAG DECANTER FOR FLEXI CONT (MISCELLANEOUS) ×3 IMPLANT
BENZOIN TINCTURE PRP APPL 2/3 (GAUZE/BANDAGES/DRESSINGS) ×3 IMPLANT
BIT DRILL 5.0/4.0 (BIT) IMPLANT
BLADE CLIPPER SURG (BLADE) IMPLANT
BLADE SURG 11 STRL SS (BLADE) ×3 IMPLANT
BONE ALLOSTEM MORSELIZED 5CC (Bone Implant) ×2 IMPLANT
BRUSH SCRUB EZ PLAIN DRY (MISCELLANEOUS) ×3 IMPLANT
BUR MATCHSTICK NEURO 3.0 LAGG (BURR) ×3 IMPLANT
BUR PRECISION FLUTE 6.0 (BURR) ×3 IMPLANT
CANISTER SUCT 3000ML PPV (MISCELLANEOUS) ×3 IMPLANT
CAP LOCKING (Cap) ×12 IMPLANT
CAP LOCKING 5.5 CREO (Cap) IMPLANT
CLOSURE WOUND 1/2 X4 (GAUZE/BANDAGES/DRESSINGS) ×2
CONT SPEC 4OZ CLIKSEAL STRL BL (MISCELLANEOUS) ×6 IMPLANT
COVER BACK TABLE 60X90IN (DRAPES) ×3 IMPLANT
DECANTER SPIKE VIAL GLASS SM (MISCELLANEOUS) ×3 IMPLANT
DERMABOND ADHESIVE PROPEN (GAUZE/BANDAGES/DRESSINGS) ×2
DERMABOND ADVANCED .7 DNX6 (GAUZE/BANDAGES/DRESSINGS) IMPLANT
DRAPE C-ARM 42X72 X-RAY (DRAPES) ×3 IMPLANT
DRAPE C-ARMOR (DRAPES) ×3 IMPLANT
DRAPE LAPAROTOMY 100X72X124 (DRAPES) ×3 IMPLANT
DRAPE POUCH INSTRU U-SHP 10X18 (DRAPES) ×3 IMPLANT
DRAPE PROXIMA HALF (DRAPES) IMPLANT
DRAPE SURG 17X23 STRL (DRAPES) ×3 IMPLANT
DRILL 5.0/4.0 (BIT) ×3
DRSG OPSITE 4X5.5 SM (GAUZE/BANDAGES/DRESSINGS) ×4 IMPLANT
DRSG OPSITE POSTOP 4X10 (GAUZE/BANDAGES/DRESSINGS) ×2 IMPLANT
DURAPREP 26ML APPLICATOR (WOUND CARE) ×3 IMPLANT
ELECT BLADE 4.0 EZ CLEAN MEGAD (MISCELLANEOUS) ×3
ELECT REM PT RETURN 9FT ADLT (ELECTROSURGICAL) ×3
ELECTRODE BLDE 4.0 EZ CLN MEGD (MISCELLANEOUS) IMPLANT
ELECTRODE REM PT RTRN 9FT ADLT (ELECTROSURGICAL) ×1 IMPLANT
EVACUATOR 3/16  PVC DRAIN (DRAIN) ×2
EVACUATOR 3/16 PVC DRAIN (DRAIN) ×1 IMPLANT
GAUZE SPONGE 4X4 12PLY STRL (GAUZE/BANDAGES/DRESSINGS) ×3 IMPLANT
GAUZE SPONGE 4X4 16PLY XRAY LF (GAUZE/BANDAGES/DRESSINGS) ×2 IMPLANT
GLOVE BIO SURGEON STRL SZ8 (GLOVE) ×6 IMPLANT
GLOVE BIOGEL PI IND STRL 7.5 (GLOVE) IMPLANT
GLOVE BIOGEL PI IND STRL 8.5 (GLOVE) IMPLANT
GLOVE BIOGEL PI INDICATOR 7.5 (GLOVE) ×2
GLOVE BIOGEL PI INDICATOR 8.5 (GLOVE) ×2
GLOVE ECLIPSE 8.5 STRL (GLOVE) ×2 IMPLANT
GLOVE EXAM NITRILE LRG STRL (GLOVE) IMPLANT
GLOVE EXAM NITRILE MD LF STRL (GLOVE) IMPLANT
GLOVE EXAM NITRILE XL STR (GLOVE) IMPLANT
GLOVE EXAM NITRILE XS STR PU (GLOVE) IMPLANT
GLOVE INDICATOR 8.5 STRL (GLOVE) ×6 IMPLANT
GLOVE SURG SS PI 7.0 STRL IVOR (GLOVE) ×4 IMPLANT
GOWN STRL REUS W/ TWL LRG LVL3 (GOWN DISPOSABLE) IMPLANT
GOWN STRL REUS W/ TWL XL LVL3 (GOWN DISPOSABLE) ×2 IMPLANT
GOWN STRL REUS W/TWL 2XL LVL3 (GOWN DISPOSABLE) ×2 IMPLANT
GOWN STRL REUS W/TWL LRG LVL3 (GOWN DISPOSABLE)
GOWN STRL REUS W/TWL XL LVL3 (GOWN DISPOSABLE) ×9
IMPL SPINE RISE 8-15-10-26M (Neuro Prosthesis/Implant) IMPLANT
IMPLANT SPINE RISE 8-15-10-26M (Neuro Prosthesis/Implant) ×12 IMPLANT
KIT BASIN OR (CUSTOM PROCEDURE TRAY) ×3 IMPLANT
KIT ROOM TURNOVER OR (KITS) ×3 IMPLANT
LIQUID BAND (GAUZE/BANDAGES/DRESSINGS) ×1 IMPLANT
NDL HYPO 25X1 1.5 SAFETY (NEEDLE) ×1 IMPLANT
NEEDLE HYPO 25X1 1.5 SAFETY (NEEDLE) ×3 IMPLANT
NS IRRIG 1000ML POUR BTL (IV SOLUTION) ×3 IMPLANT
PACK LAMINECTOMY NEURO (CUSTOM PROCEDURE TRAY) ×3 IMPLANT
PAD ARMBOARD 7.5X6 YLW CONV (MISCELLANEOUS) ×15 IMPLANT
PATTIES SURGICAL .5 X.5 (GAUZE/BANDAGES/DRESSINGS) ×2 IMPLANT
PATTIES SURGICAL 1X1 (DISPOSABLE) ×2 IMPLANT
ROD 65MM SPINAL (Rod) ×4 IMPLANT
ROD SPNL 5.5 CREO TI 65 (Rod) IMPLANT
SCREW CORT CREO 6.0-5.0X30MM (Screw) ×4 IMPLANT
SCREW MOD 6.0-5.0X35MM (Screw) ×4 IMPLANT
SCREW PREASSEMBLY 6.0-5.0X35 (Screw) ×4 IMPLANT
SPONGE LAP 4X18 X RAY DECT (DISPOSABLE) IMPLANT
SPONGE SURGIFOAM ABS GEL 100 (HEMOSTASIS) ×5 IMPLANT
STRIP CLOSURE SKIN 1/2X4 (GAUZE/BANDAGES/DRESSINGS) ×4 IMPLANT
SUT VIC AB 0 CT1 18XCR BRD8 (SUTURE) ×1 IMPLANT
SUT VIC AB 0 CT1 8-18 (SUTURE) ×6
SUT VIC AB 2-0 CT1 18 (SUTURE) ×5 IMPLANT
SUT VICRYL 4-0 PS2 18IN ABS (SUTURE) ×3 IMPLANT
SYR 20ML ECCENTRIC (SYRINGE) ×3 IMPLANT
TOWEL OR 17X24 6PK STRL BLUE (TOWEL DISPOSABLE) ×3 IMPLANT
TOWEL OR 17X26 10 PK STRL BLUE (TOWEL DISPOSABLE) ×3 IMPLANT
TRAY FOLEY CATH 14FRSI W/METER (CATHETERS) ×3 IMPLANT
TULIP CREP AMP 5.5MM (Orthopedic Implant) ×4 IMPLANT
WATER STERILE IRR 1000ML POUR (IV SOLUTION) ×3 IMPLANT

## 2014-10-04 NOTE — Anesthesia Postprocedure Evaluation (Signed)
  Anesthesia Post-op Note  Patient: [name]  Procedure(s) Performed: Procedure(s) (LRB): POSTERIOR LUMBAR INTERBODY FUSION LUMBAR FOUR-FIVE,LUMBAR FIVE-SACRAL ONE (N/A)  Patient Location: PACU  Anesthesia Type: General  Level of Consciousness: awake and alert   Airway and Oxygen Therapy: Patient Spontanous Breathing  Post-op Pain: mild  Post-op Assessment: Post-op Vital signs reviewed, Patient's Cardiovascular Status Stable, Respiratory Function Stable, Patent Airway and No signs of Nausea or vomiting  Last Vitals:  Filed Vitals:   10/04/14 1223  BP: 98/58  Pulse: 95  Temp:   Resp: 27    Post-op Vital Signs: stable   Complications: No apparent anesthesia complications

## 2014-10-04 NOTE — Clinical Social Work Note (Signed)
CSW Consult Acknowledged:   CSW received a consult for SNF placement. CSW awaiting PT/OT evaluation to determine the appropriate level of care.      Milah Recht, MSW, LCSWA [phone]  

## 2014-10-04 NOTE — Anesthesia Procedure Notes (Signed)
Procedure Name: Intubation Date/Time: 10/04/2014 7:29 AM Performed by: Neldon Newport Pre-anesthesia Checklist: Patient being monitored, Suction available, Emergency Drugs available, Patient identified and Timeout performed Patient Re-evaluated:Patient Re-evaluated prior to inductionOxygen Delivery Method: Circle system utilized Preoxygenation: Pre-oxygenation with 100% oxygen Intubation Type: IV induction Ventilation: Mask ventilation without difficulty Laryngoscope Size: Mac and 2 Grade View: Grade II Tube type: Oral Tube size: 7.0 mm Number of attempts: 1 Placement Confirmation: positive ETCO2,  ETT inserted through vocal cords under direct vision and breath sounds checked- equal and bilateral Secured at: 21 cm Tube secured with: Tape Dental Injury: Teeth and Oropharynx as per pre-operative assessment

## 2014-10-04 NOTE — Op Note (Signed)
Preoperative diagnosis: Lumbar spinal stenosis degenerative disc disease severe foraminal stenosis L4-5 L5-S1 with marked facet arthropathy and bilateral L5 and S1 radiculopathies  Postoperative diagnosis: Same  Procedure: Redo decompressive lumbar laminectomy L4-5 and excess and requiring more work than would be needed with a standard interbody fusion. Decompressive lumbar laminectomy L5-S1  Posterior lumbar interbody fusion L4-5 L5-S1 using the globus rise expandable cage system packed with locally harvested autograft mixed with allostem strips and morsels  Cortical screw fixation L4-S1 using the globus Creo 5.5 modular cortical screw set  Placement of large Hemovac drain  Surgeon: Dominica Severin Raphael Espe  Asst.: Kristeen Miss  Anesthesia: Gen.  EBL: Minimal  History of present illness: Patient is a 69 year old female is a progress worsening back and predominant left leg pain rating down L5 and S1 nerve root pattern workup revealed severe spinal stenosis at L4-5 and L5-S1 severe degenerative collapse and marked foraminal stenosis the L4-L5 and S1 nerve roots bilaterally. Patient had previous surgery at L4-5 on the left due to patient's failure conservative treatment imaging findings progression of clinical syndrome in the setting of old surgery have recommended decompression stabilization procedure. I extensively went over the risks and benefits of the operation with the patient as well as perioperative course expectations of outcome and alternatives of surgery and she understood and agreed to proceed forward.  Operative procedure: Patient brought into the or was induced under general anesthesia positioned prone on the Wilson frame her back was prepped and draped in routine sterile fashion. Her old incision was opened up and the scar tissues dissected free and subperiosteal dissections care lamina of L4-L5 and S1 bilaterally. Interoperative x-ray confirmed an indication appropriate levels and using AP and  lateral fluoroscopy the entry points at the 5 and 7:00 position of the L4 pedicle was identified for cortical screw placement at this level. Then using a drill and guide 25 mm drill holes were drilled appropriately were tapped with a 4 Probed again and 6 over 50 cortical screw sets measuring 35 mm were placed at L4. Both AP and lateral fluoroscopy confirmed good positioning screws. At this point I drilled troughs inferiorly screws to show my inferior aspect of the superior aspect of the decompression facets were drilled down spinous processes at L4 and L5 were removed central decompression was begun. First marching down the right-sided not been operated on complete facetectomies were performed and there was marked facet arthropathy and severe hourglass compression of thecal sac predominantly in the L5 nerve root on the side. Teased off the dura complete medial facetectomies were performed aggressive undergoing of both superior articulating facets allowed indication the lateral aspect the disc space. After adequate from known as been performed at the L4-L5 and S1 nerve roots on that side, Attention was taken to the left. Working through the scar tissue redo decompressive laminectomy was performed at L4-5. Complete medial facetectomies were also performed radical foraminotomies of the L4-L5 and S1 nerve roots on the left and aggressive undergoing the superior tickling facet a ladder medication lateral aspect the disc space on the side. Then the disc spaces epidural veins were coagulated disc spaces were incised and cleanout bilaterally using sequential distraction starting with a 7 a opened the space up to 10 with a 10 distractor in place cleaned out the disc more from the right side prepped the endplates and 3-81 expandable 10 mm cage was inserted open approximate 5 turns opening up the disc space. Then testing and L5-S1 a similar fashion L5-S1 was cleaned out  endplates were prepared in the same size cage was  inserted. After all the distractors removed the contralateral side was also cleaned out the central disc was also cleaned out endplates were prepared bilaterally local autograft mixed was packed centrally and the contralateral cages packed placement of the cage allowed opening up the disc spaces and decompression of the L4 and L5 exiting nerve roots. Then testing the cortical screw placement L5 and S1 using a slightly lateral trajectory straight and pedicle holes were drilled probed and tapped probed and [id number] inserted L5 and 60350 S1. After all the screw placement up and confirmed with fluoroscopy, I attached the rods top tightness were anchored in place the rods were then tightened down I reexplored the foramen to confirm patency then laid Gelfoam was laid up the dura large Hemovac drain was placed the wounds closed in layers with interrupted Vicryl and a running 4 subcuticular. Dermabond benzo and Steri-Strips were applied patient recovered in stable condition. At the end of case on it counts sponge counts were correct.

## 2014-10-04 NOTE — Anesthesia Preprocedure Evaluation (Addendum)
Anesthesia Evaluation  Patient identified by MRN, date of birth, ID band Patient awake    Reviewed: Allergy & Precautions, NPO status , Patient's Chart, lab work & pertinent test results  Airway Mallampati: II  TM Distance: <3 FB Neck ROM: Full    Dental no notable dental hx. (+) Dental Advidsory Given, Poor Dentition, Missing, Caps   Pulmonary sleep apnea , former smoker,  breath sounds clear to auscultation  Pulmonary exam normal       Cardiovascular hypertension, Pt. on medications + CAD, + Past MI and +CHF Rhythm:Regular Rate:Normal     Neuro/Psych Seizures -, Well Controlled,  parkinsons dz negative psych ROS   GI/Hepatic negative GI ROS, Neg liver ROS,   Endo/Other  diabetesMorbid obesity  Renal/GU negative Renal ROS  negative genitourinary   Musculoskeletal negative musculoskeletal ROS (+)   Abdominal   Peds negative pediatric ROS (+)  Hematology negative hematology ROS (+)   Anesthesia Other Findings Echo results: Good news: Essentially normal echocardiogram and normal pump function and normal valve function. No regional wall motion abnormalities = No signs to suggest heart attack.. EF: 55-60%. Significant valve lesions to suggest a worrisome reason for the murmur. Probably has mild calcification the aortic valve called aortic sclerosis 3 there is no sign of narrowing. This is a benign murmur Last seizure was two or three months ago. Non compliant CPAP  Reproductive/Obstetrics negative OB ROS                           Anesthesia Physical Anesthesia Plan  ASA: III  Anesthesia Plan: General   Post-op Pain Management:    Induction: Intravenous  Airway Management Planned: Oral ETT  Additional Equipment:   Intra-op Plan:   Post-operative Plan: Extubation in OR  Informed Consent: I have reviewed the patients History and Physical, chart, labs and discussed the procedure  including the risks, benefits and alternatives for the proposed anesthesia with the patient or authorized representative who has indicated his/her understanding and acceptance.   Dental advisory given and Dental Advisory Given  Plan Discussed with: CRNA and Surgeon  Anesthesia Plan Comments:        Anesthesia Quick Evaluation

## 2014-10-04 NOTE — Transfer of Care (Signed)
Immediate Anesthesia Transfer of Care Note  Patient: [name]  Procedure(s) Performed: Procedure(s): POSTERIOR LUMBAR INTERBODY FUSION LUMBAR FOUR-FIVE,LUMBAR FIVE-SACRAL ONE (N/A)  Patient Location: PACU  Anesthesia Type:General  Level of Consciousness: awake, alert  and oriented  Airway & Oxygen Therapy: Patient Spontanous Breathing and Patient connected to face mask oxygen  Post-op Assessment: Report given to RN, Post -op Vital signs reviewed and stable and Patient moving all extremities X 4  Post vital signs: Reviewed and stable  Last Vitals:  Filed Vitals:   10/04/14 0614  BP: 169/72  Pulse: 70  Temp: 36.8 C  Resp: 20    Complications: No apparent anesthesia complications

## 2014-10-04 NOTE — H&P (Signed)
[name] is an 69 y.o. female.   Chief Complaint: Back and leg pain HPI: 69 year old female with severe back and left leg pain rating down L5 and S1 nerve root pattern. Workup revealed severe lumbar spinal stenosis or disc disease L4-5 and L5-S1 previous laminotomy defect and marked spinal stenosis at those levels. Due to patient's failure conservative treatment imaging findings and progression of clinical syndrome I recommended decompressive laminectomy and fusion L4-5 L5-S1. I've extensively reviewed the risks and benefits of the operation with the patient as well as perioperative course expectations of outcome and alternatives to surgery and she understands and agrees to proceed forward.  Past Medical History  Diagnosis Date  . Diastolic heart failure 0301    Grade 1 diastolic Dysfunction by Echo   . Seizure disorder   . Hyperlipidemia     takes Lipitor daily  . Obstructive sleep apnea   . Empty sella     on MRI in 2009.  Marland Kitchen Abnormal liver function      in the past.  . Pancreatitis     takes Pancrelipase daily  . Anemia   . HTN (hypertension)     takes [employer] daily  . DM (diabetes mellitus)     takes Victoza daily as well as Lantus and Humalog  . Parkinson's disease     takes Sinemet daily  . Demand myocardial infarction 2012    Demand Infarction in setting of Pancreatitis --> mild Troponin elevation, Non-obstructive CAD  . Peripheral edema     takes Lasix daily  . History of influenza     antibiotic given yesterday along with taking Delsym cough syrup  . Pneumonia 2012  . Headache(784.0)     last migraine-4-24yrs ago  . Seizures     takes Lamictal daily and Primidone nightly;last seizure 2wks ago  . Peripheral neuropathy   . Arthritis     all over  . Joint swelling   . Chronic back pain     stenosis  . Bruises easily   . History of colon polyps     benign  . Diverticulosis   . Urinary frequency   . Urinary urgency   . Nocturia   . History  of blood transfusion     no abnormal reaction noted  . Cataract     right eye;immature  . Glaucoma   . Depression     takes Abilify daily as well as Zoloft  . Chronic cough     Past Surgical History  Procedure Laterality Date  . Cholecystectomy    . Abdominal hysterectomy    . Tubal ligation    . Tonsillectomy    . Appendectomy    . Rectal polypectomy    . Eye cysts Bilateral   . Back surgery    . Colonoscopy N/A 08/15/2012    Procedure: COLONOSCOPY;  Surgeon: Arta Silence, MD;  Location: [facility];  Service: Endoscopy;  Laterality: N/A;  . Vaginal cyst removed      several times  . Esophagogastroduodenoscopy    . Lasik    . Nm myoview ltd  March 2012    Probably normal [employer]. Mild reversible defect in anterior wall appears to be breast attenuation but cannot exclude ischemia:  FALSE POSITIVE  . Cardiac catheterization  2009/March 2012    2009: (Dr. Alvie Heidelberg) Nonobstructive CAD; 2012: Minimal CAD --> false positive stress test  . Transthoracic echocardiogram  November 2010    Mild concentric LVH. EF 55-60%. No RWMA, Abnormal  Relaxation (Gr 1 DD). Mildly thickened AoV leaflets w/o stenosis    Family History  Problem Relation Age of Onset  . Allergies Father   . Heart disease Father     before 72  . Heart failure Father   . Hypertension Father   . Hyperlipidemia Father   . Heart disease Mother   . Diabetes Mother   . Hypertension Mother   . Hyperlipidemia Mother   . Hypertension Sister   . Heart disease Sister     before 45  . Hyperlipidemia Sister   . Cancer Other   . Diabetes Brother   . Hypertension Brother   . Diabetes Sister   . Hypertension Sister   . Diabetes Son   . Hypertension Son    Social History:  reports that she has quit smoking. Her smoking use included Cigarettes. She has a 12.5 pack-year smoking history. She has never used smokeless tobacco. She reports that she does not drink alcohol or use illicit drugs.  Allergies:   Allergies  Allergen Reactions  . Codeine     Headache and "just feel off"  . Penicillins Hives  . Sulfa Antibiotics Hives  . Other Rash    Bleach    Medications Prior to Admission  Medication Sig Dispense Refill  . ACCU-CHEK FASTCLIX LANCETS MISC Use to check blood sugar 3 times per day dx code E11.65 300 each 1  . Alcohol Swabs (B-D SINGLE USE SWABS REGULAR) PADS Use as directed 300 each 1  . ARIPiprazole (ABILIFY) 10 MG tablet Take 10 mg by mouth daily.    Marland Kitchen aspirin EC 81 MG tablet Take 1 tablet (81 mg total) by mouth at bedtime.    Marland Kitchen atorvastatin (LIPITOR) 40 MG tablet TAKE ONE TABLET BY MOUTH IN THE EVENING 30 tablet 3  . Blood Glucose Calibration (ACCU-CHEK SMARTVIEW CONTROL) LIQD Use as directed 1 each 1  . Blood Glucose Monitoring Suppl (ACCU-CHEK NANO SMARTVIEW) W/DEVICE KIT Use to check blood sugar 3 times per day dx code E11.65 1 kit 0  . carbidopa-levodopa (SINEMET IR) 25-100 MG per tablet Take 1 tablet by mouth 3 (three) times daily.     . cholestyramine (QUESTRAN) 4 G packet Take 4 g by mouth 2 (two) times daily as needed.    Marland Kitchen dextromethorphan (DELSYM) 30 MG/5ML liquid Take 15 mg by mouth as needed for cough.    . doxycycline (VIBRAMYCIN) 100 MG capsule Take 100 mg by mouth 2 (two) times daily. Take for 10 Days    . furosemide (LASIX) 40 MG tablet Take 40 mg by mouth every evening.     Marland Kitchen glucose blood (ACCU-CHEK SMARTVIEW) test strip accu-chek smart view test strips, use to check blood sugars 3 times per day DAW, dx code E11.65 300 each 1  . HYDROcodone-acetaminophen (NORCO/VICODIN) 5-325 MG per tablet Take 1 tablet by mouth every 4 (four) hours as needed for moderate pain.     Marland Kitchen insulin lispro (HUMALOG) 100 UNIT/ML KiwkPen Inject 8 units at breakfast 8 units at lunch and 12 units at supper (Patient taking differently: Inject 6 units at breakfast 6 units at lunch and 10 units at supper) 5 pen 5  . isosorbide mononitrate (IMDUR) 30 MG 24 hr tablet Take 30 mg by mouth daily.      Marland Kitchen lamoTRIgine (LAMICTAL) 150 MG tablet Take 150 mg by mouth daily.     Marland Kitchen LANTUS SOLOSTAR 100 UNIT/ML Solostar Pen INJECT 38 UNITS SUBCUTANEOUSLY ONCE DAILY AT  10  PM (Patient  taking differently: INJECT 32 UNITS SUBCUTANEOUSLY ONCE DAILY AT  10  PM) 15 mL 2  . Liraglutide (VICTOZA) 18 MG/3ML SOPN Inject 1.8 daily 3 pen 3  . meloxicam (MOBIC) 15 MG tablet Take 7.5 mg by mouth daily.     . nebivolol (BYSTOLIC) 10 MG tablet Take 10 mg by mouth daily.      Marland Kitchen olmesartan (BENICAR) 40 MG tablet Take 40 mg by mouth daily.    Marland Kitchen omega-3 acid ethyl esters (LOVAZA) 1 G capsule Take 1 g by mouth daily.    . Pancrelipase, Lip-Prot-Amyl, 25000 UNITS CPEP Take 5 capsules by mouth 3 (three) times daily. 2 caps at breakfast, 1 at lunch, 2 at dinner    . potassium chloride SA (K-DUR,KLOR-CON) 20 MEQ tablet Take 20 mEq by mouth daily.      . primidone (MYSOLINE) 50 MG tablet Take 50 mg by mouth at bedtime.     . sertraline (ZOLOFT) 100 MG tablet Take 50 mg by mouth daily.       Results for orders placed or performed during the hospital encounter of 10/04/14 (from the past 48 hour(s))  Glucose, capillary     Status: Abnormal   Collection Time: 10/04/14  6:09 AM  Result Value Ref Range   Glucose-Capillary 133 (H) 70 - 99 mg/dL   No results found.  Review of Systems  Constitutional: Negative.   HENT: Negative.   Eyes: Negative.   Respiratory: Negative.   Cardiovascular: Negative.   Gastrointestinal: Negative.   Genitourinary: Negative.   Musculoskeletal: Negative.   Skin: Negative.   Neurological: Negative.   Endo/Heme/Allergies: Negative.   Psychiatric/Behavioral: Negative.     Blood pressure 169/72, pulse 70, temperature 98.3 F (36.8 C), temperature source Oral, resp. rate 20, height 5' 1"  (1.549 m), weight 125.283 kg (276 lb 3.2 oz), SpO2 97 %. Physical Exam  Constitutional: She is oriented to person, place, and time. She appears well-developed and well-nourished.  HENT:  Head:  Normocephalic.  Neck: Normal range of motion.  Cardiovascular: Normal rate.   Respiratory: Effort normal.  GI: Soft.  Neurological: She is alert and oriented to person, place, and time. She has normal strength. GCS eye subscore is 4. GCS verbal subscore is 5. GCS motor subscore is 6.  Strength 5 over 5 in her iliopsoas, quads, and she's, gastrocs, and tibialis, EHL.  Skin: Skin is warm and dry.     Assessment/Plan 69 year old female presents for a decompressive laminectomy and fusion L4-5 L5-S1.  Kiven Vangilder P 10/04/2014, 7:18 AM

## 2014-10-04 NOTE — Progress Notes (Signed)
ANTIBIOTIC CONSULT NOTE - INITIAL  Pharmacy Consult for vancomycin Indication: surgical prophylaxis while pt has lumbar drain in place  Allergies  Allergen Reactions  . Codeine     Headache and "just feel off"  . Penicillins Hives  . Sulfa Antibiotics Hives  . Other Rash    Bleach    Patient Measurements: Height: 5' 1" (154.9 cm) Weight: 276 lb 3.2 oz (125.283 kg) IBW/kg (Calculated) : 47.8   Vital Signs: Temp: 97.7 F (36.5 C) (04/11 1349) Temp Source: Oral (04/11 1349) BP: 109/44 mmHg (04/11 1349) Pulse Rate: 77 (04/11 1349) Intake/Output from previous day:   Intake/Output from this shift: Total I/O In: 2950 [P.O.:150; I.V.:2350; Blood:200; IV Piggyback:250] Out: 830 [Urine:230; Blood:600]  Labs: No results for input(s): WBC, HGB, PLT, LABCREA, CREATININE in the last 72 hours. Estimated Creatinine Clearance: 86.1 mL/min (by C-G formula based on Cr of 0.76). No results for input(s): VANCOTROUGH, VANCOPEAK, VANCORANDOM, GENTTROUGH, GENTPEAK, GENTRANDOM, TOBRATROUGH, TOBRAPEAK, TOBRARND, AMIKACINPEAK, AMIKACINTROU, AMIKACIN in the last 72 hours.   Microbiology: Recent Results (from the past 720 hour(s))  Surgical pcr screen     Status: None   Collection Time: 09/21/14 11:27 AM  Result Value Ref Range Status   MRSA, PCR NEGATIVE NEGATIVE Final   Staphylococcus aureus NEGATIVE NEGATIVE Final    Comment:        The Xpert SA Assay (FDA approved for NASAL specimens in patients over 26 years of age), is one component of a comprehensive surveillance program.  Test performance has been validated by [facility] for patients greater than or equal to 60 year old. It is not intended to diagnose infection nor to guide or monitor treatment.     Medical History: Past Medical History  Diagnosis Date  . Diastolic heart failure 5093    Grade 1 diastolic Dysfunction by Echo   . Seizure disorder   . Hyperlipidemia     takes Lipitor daily  . Obstructive sleep apnea   .  Empty sella     on MRI in 2009.  Marland Kitchen Abnormal liver function      in the past.  . Pancreatitis     takes Pancrelipase daily  . Anemia   . HTN (hypertension)     takes [employer] daily  . DM (diabetes mellitus)     takes Victoza daily as well as Lantus and Humalog  . Parkinson's disease     takes Sinemet daily  . Demand myocardial infarction 2012    Demand Infarction in setting of Pancreatitis --> mild Troponin elevation, Non-obstructive CAD  . Peripheral edema     takes Lasix daily  . History of influenza     antibiotic given yesterday along with taking Delsym cough syrup  . Pneumonia 2012  . Headache(784.0)     last migraine-4-17yr ago  . Seizures     takes Lamictal daily and Primidone nightly;last seizure 2wks ago  . Peripheral neuropathy   . Arthritis     all over  . Joint swelling   . Chronic back pain     stenosis  . Bruises easily   . History of colon polyps     benign  . Diverticulosis   . Urinary frequency   . Urinary urgency   . Nocturia   . History of blood transfusion     no abnormal reaction noted  . Cataract     right eye;immature  . Glaucoma   . Depression     takes Abilify daily as well  as Zoloft  . Chronic cough     Medications:  Prescriptions prior to admission  Medication Sig Dispense Refill Last Dose  . ACCU-CHEK FASTCLIX LANCETS MISC Use to check blood sugar 3 times per day dx code E11.65 300 each 1 10/03/2014 at Unknown time  . Alcohol Swabs (B-D SINGLE USE SWABS REGULAR) PADS Use as directed 300 each 1 10/03/2014 at Unknown time  . ARIPiprazole (ABILIFY) 10 MG tablet Take 10 mg by mouth daily.   10/04/2014 at 0445  . aspirin EC 81 MG tablet Take 1 tablet (81 mg total) by mouth at bedtime.   Past Week at Unknown time  . atorvastatin (LIPITOR) 40 MG tablet TAKE ONE TABLET BY MOUTH IN THE EVENING 30 tablet 3 10/03/2014 at Unknown time  . Blood Glucose Calibration (ACCU-CHEK SMARTVIEW CONTROL) LIQD Use as directed 1 each 1 10/03/2014  at Unknown time  . Blood Glucose Monitoring Suppl (ACCU-CHEK NANO SMARTVIEW) W/DEVICE KIT Use to check blood sugar 3 times per day dx code E11.65 1 kit 0 10/03/2014 at Unknown time  . carbidopa-levodopa (SINEMET IR) 25-100 MG per tablet Take 1 tablet by mouth 3 (three) times daily.    10/03/2014 at Unknown time  . cholestyramine (QUESTRAN) 4 G packet Take 4 g by mouth 2 (two) times daily as needed.   Past Month at Unknown time  . dextromethorphan (DELSYM) 30 MG/5ML liquid Take 15 mg by mouth as needed for cough.   Past Week at Unknown time  . doxycycline (VIBRAMYCIN) 100 MG capsule Take 100 mg by mouth 2 (two) times daily. Take for 10 Days   Past Week at Unknown time  . furosemide (LASIX) 40 MG tablet Take 40 mg by mouth every evening.    10/03/2014 at Unknown time  . glucose blood (ACCU-CHEK SMARTVIEW) test strip accu-chek smart view test strips, use to check blood sugars 3 times per day DAW, dx code E11.65 300 each 1 10/03/2014 at Unknown time  . HYDROcodone-acetaminophen (NORCO/VICODIN) 5-325 MG per tablet Take 1 tablet by mouth every 4 (four) hours as needed for moderate pain.    10/03/2014 at Unknown time  . insulin lispro (HUMALOG) 100 UNIT/ML KiwkPen Inject 8 units at breakfast 8 units at lunch and 12 units at supper (Patient taking differently: Inject 6 units at breakfast 6 units at lunch and 10 units at supper) 5 pen 5 10/03/2014 at Unknown time  . isosorbide mononitrate (IMDUR) 30 MG 24 hr tablet Take 30 mg by mouth daily.    10/03/2014 at Unknown time  . lamoTRIgine (LAMICTAL) 150 MG tablet Take 150 mg by mouth daily.    10/03/2014 at Unknown time  . LANTUS SOLOSTAR 100 UNIT/ML Solostar Pen INJECT 38 UNITS SUBCUTANEOUSLY ONCE DAILY AT  10  PM (Patient taking differently: INJECT 32 UNITS SUBCUTANEOUSLY ONCE DAILY AT  10  PM) 15 mL 2 10/03/2014 at Unknown time  . Liraglutide (VICTOZA) 18 MG/3ML SOPN Inject 1.8 daily 3 pen 3 10/03/2014 at Unknown time  . meloxicam (MOBIC) 15 MG tablet Take 7.5 mg by  mouth daily.    10/03/2014 at Unknown time  . nebivolol (BYSTOLIC) 10 MG tablet Take 10 mg by mouth daily.     10/03/2014 at Unknown time  . olmesartan (BENICAR) 40 MG tablet Take 40 mg by mouth daily.   10/03/2014 at Unknown time  . omega-3 acid ethyl esters (LOVAZA) 1 G capsule Take 1 g by mouth daily.   Past Week at Unknown time  . Pancrelipase, Lip-Prot-Amyl, 25000  UNITS CPEP Take 5 capsules by mouth 3 (three) times daily. 2 caps at breakfast, 1 at lunch, 2 at dinner   10/03/2014 at Unknown time  . potassium chloride SA (K-DUR,KLOR-CON) 20 MEQ tablet Take 20 mEq by mouth daily.     10/03/2014 at Unknown time  . primidone (MYSOLINE) 50 MG tablet Take 50 mg by mouth at bedtime.    10/03/2014 at Unknown time  . sertraline (ZOLOFT) 100 MG tablet Take 50 mg by mouth daily.    10/03/2014 at Unknown time   Assessment: 69 yo F s/p back surgery.  Pharmacy consulted to dose vancomycin for surgical prophylaxis while drain in place.  Wt 125.3 kg; Creat 0.76; WBC 5.5.  Afebrile.  She got vanc 1500 mg preop at 0704 am.          Goal of Therapy:  Vancomycin trough level 10-15 mcg/ml  Plan:  -vancomycin 1 gm IV q12h per obesity nomogram -dc once hemovac drain removed -dc doxycycline -anticipate short term abx and will not need vancomycin levels  Eudelia Bunch, Pharm.D. [phone] 10/04/2014 2:28 PM

## 2014-10-04 NOTE — Addendum Note (Signed)
Addendum  created 10/04/14 1331 by Neldon Newport, CRNA   Modules edited: Anesthesia Medication Administration

## 2014-10-04 NOTE — Progress Notes (Signed)
Cbg=177

## 2014-10-05 LAB — GLUCOSE, CAPILLARY
Glucose-Capillary: 195 mg/dL — ABNORMAL HIGH (ref 70–99)
Glucose-Capillary: 137 mg/dL — ABNORMAL HIGH (ref 70–99)
Glucose-Capillary: 243 mg/dL — ABNORMAL HIGH (ref 70–99)
Glucose-Capillary: 258 mg/dL — ABNORMAL HIGH (ref 70–99)

## 2014-10-05 MED ORDER — DEXAMETHASONE SODIUM PHOSPHATE 10 MG/ML IJ SOLN
10.0000 mg | Freq: Four times a day (QID) | INTRAMUSCULAR | Status: AC
  Administered 2014-10-05 (×2): 10 mg via INTRAVENOUS
  Filled 2014-10-05 (×2): qty 1

## 2014-10-05 MED FILL — Sodium Chloride Irrigation Soln 0.9%: Qty: 3000 | Status: AC

## 2014-10-05 MED FILL — Heparin Sodium (Porcine) Inj 1000 Unit/ML: INTRAMUSCULAR | Qty: 30 | Status: AC

## 2014-10-05 MED FILL — Sodium Chloride IV Soln 0.9%: INTRAVENOUS | Qty: 1000 | Status: AC

## 2014-10-05 NOTE — Evaluation (Signed)
Occupational Therapy Evaluation Patient Details Name: [name] MRN: [id number] DOB: [birth year] Today's Date: 10/05/2014    History of Present Illness Pt s/p L4-L5,  L5-S1 PILF.  PMH: parkinson's, DM, HTN, diastolic heart failure, peripheral edema/neuropathy.    Clinical Impression   Pt admitted with the above diagnoses and presents with below problem list. Pt will benefit from continued acute OT to address the below listed deficits and maximize independence with BADLs prior to d/c to venue below. PTA pt was mod I with ADLs. Pt currently at +2 min A level for LB ADLs and stand pivot toilet/shower transfers. Recommend SNF for further rehab prior to returning home with sister. ADL education provided.     Follow Up Recommendations  SNF    Equipment Recommendations  Other (comment) (TBD next venue)    Recommendations for Other Services       Precautions / Restrictions Precautions Precautions: Back Precaution Booklet Issued: Yes (comment) Precaution Comments: reviewed with pt and sister Required Braces or Orthoses: Spinal Brace Spinal Brace: Lumbar corset;Applied in sitting position Restrictions Weight Bearing Restrictions: No      Mobility Bed Mobility       General bed mobility comments: in recliner  Transfers Overall transfer level: Needs assistance Equipment used: Rolling walker (2 wheeled) Transfers: Sit to/from [facility] Sit to Stand: Mod assist Stand pivot transfers: Min assist;+2 physical assistance       General transfer comment: from recliner and BSC; increased time to come to standing  and pivot with min A +2 for balance and due to LUE/LLE weakness. Cues for technique.    Balance Overall balance assessment: Needs assistance Sitting-balance support: Bilateral upper extremity supported   Sitting balance - Comments: faigues quickly in unsupported sitting   Standing balance support: Bilateral upper extremity supported;During  functional activity Standing balance-Leahy Scale: Poor Standing balance comment: needs rw for support                            ADL Overall ADL's : Needs assistance/impaired Eating/Feeding: Set up;Sitting   Grooming: Set up;Sitting;Cueing for compensatory techniques   Upper Body Bathing: Minimal assitance;Sitting;Cueing for compensatory techniques   Lower Body Bathing: +2 for physical assistance;Minimal assistance;Sit to/from stand;With adaptive equipment   Upper Body Dressing : Set up;Sitting   Lower Body Dressing: Minimal assistance;+2 for physical assistance;Sit to/from stand;With adaptive equipment   Toilet Transfer: Minimal assistance;+2 for physical assistance;Stand-pivot;BSC;RW   Toileting- Clothing Manipulation and Hygiene: Moderate assistance;Sit to/from stand;+2 for physical assistance   Tub/ Shower Transfer: Minimal assistance;+2 for physical assistance;Stand-pivot;3 in 1;Rolling walker     General ADL Comments: Pt completed stand pivot to [facility] with min +2 physical assist. Weakness in LUE/LLE noted. Edcauted pt and sister on techniques and AE for safe completion of ADLs with back precautions. Discussed d/c to SNF due to pt level of  assist greater than what sister can provided at home.     Vision     Perception     Praxis      Pertinent Vitals/Pain Pain Assessment: 0-10 Pain Score: 8  Pain Location: back; anterior-lateral Lt thigh radiating to ankle Pain Descriptors / Indicators: Aching;Numbness;Radiating;Sharp Pain Intervention(s): Limited activity within patient's tolerance;Monitored during session;Repositioned     Hand Dominance Right   Extremity/Trunk Assessment Upper Extremity Assessment Upper Extremity Assessment: Generalized weakness;LUE deficits/detail RUE Deficits / Details: Strength grossly 4/5 LUE Deficits / Details: Strength LUE grossly 3+/5   Lower Extremity Assessment Lower Extremity  Assessment: Defer to PT evaluation;LLE  deficits/detail (weakness noted LLE)       Communication Communication Communication: No difficulties   Cognition Arousal/Alertness: Awake/alert Behavior During Therapy: WFL for tasks assessed/performed Overall Cognitive Status: Within Functional Limits for tasks assessed       Memory: Decreased recall of precautions             General Comments       Exercises       Shoulder Instructions      Home Living Family/patient expects to be discharged to:: Private residence Living Arrangements: Other relatives Available Help at Discharge: Family Type of Home: House Home Access: Stairs to enter Technical brewer of Steps: 2 Entrance Stairs-Rails: None Home Layout: One level     Bathroom Shower/Tub: Teacher, early years/pre: [place]: [place] - single point;Shower seat;Adaptive equipment Adaptive Equipment: Reacher Additional Comments: Sister is available to provide 24 hour supervision, does not currently work      Prior Functioning/Environment Level of Independence: Needs assistance  Gait / Transfers Assistance Needed: IT consultant / Homemaking Assistance Needed: Major cooking and cleaning is done by sister; mod independent with ADLs   Comments: Previously able to bath herself, has tub with shower chair in it    OT Diagnosis: Generalized weakness;Acute pain   OT Problem List: Decreased strength;Decreased activity tolerance;Impaired balance (sitting and/or standing);Decreased knowledge of use of DME or AE;Decreased knowledge of precautions;Pain;Impaired UE functional use   OT Treatment/Interventions: Self-care/ADL training;Therapeutic exercise;Neuromuscular education;Energy conservation;DME and/or AE instruction;Therapeutic activities;Patient/family education;Balance training    OT Goals(Current goals can be found in the care plan section) Acute Rehab OT Goals Patient Stated Goal: rehab then home with sister OT Goal Formulation:  With patient/family Time For Goal Achievement: 10/12/14 Potential to Achieve Goals: Good ADL Goals Pt Will Perform Grooming: with modified independence;sitting;with adaptive equipment Pt Will Perform Lower Body Bathing: with min guard assist;with adaptive equipment;sit to/from stand Pt Will Perform Lower Body Dressing: with min guard assist;with adaptive equipment;sit to/from stand Pt Will Transfer to Toilet: with min guard assist;ambulating;bedside commode Pt Will Perform Toileting - Clothing Manipulation and hygiene: with min guard assist;with adaptive equipment;sit to/from stand Additional ADL Goal #1: Pt will complete bed mobility at min guard level to prepare for OOB ADLs.  OT Frequency: Min 2X/week   Barriers to D/C: Decreased caregiver support  sister unable to provided level of asssit        Co-evaluation              End of Session Equipment Utilized During Treatment: Gait belt;Rolling walker;Back brace  Activity Tolerance: Patient tolerated treatment well Patient left: in chair;with call bell/phone within reach;with family/visitor present;with nursing/sitter in room   Time: 1115-1150 OT Time Calculation (min): 35 min Charges:  OT General Charges $OT Visit: 1 Procedure OT Evaluation $Initial OT Evaluation Tier I: 1 Procedure OT Treatments $Self Care/Home Management : 8-22 mins G-Codes:    Hortencia Pilar Nov 01, 2014, 12:11 PM

## 2014-10-05 NOTE — Evaluation (Signed)
Physical Therapy Evaluation Patient Details Name: [name] MRN: [id number] DOB: [birth year] Today's Date: 10/05/2014   History of Present Illness  Pt s/p L4-L5,  L5-S1 PILF.  PMH: parkinson's, DM, HTN, diastolic heart failure, peripheral edema/neuropathy.   Clinical Impression  Pt is s/p PLIF L4-L5, L5-S1, resulting in the deficits listed below (see PT Problem List).  Pt demonstrated slow movement and difficulty initiating movement, possibly due to Parkinson's.  Pt was at mod assist level for bed mobility, transfers and ambulation, with max v/c's and increased time.  Pt hopes to return home with sister who is able to provide 24 hour supervision, however, pt will need to be min assist/supervision level function and navigate 2 stairs without rails in order for safe return home with sister.  Recommend ST-SNF in order to improve strength and functional mobility necessary for return home. Pt will benefit from skilled PT to increase their independence and safety with mobility to allow discharge to the venue listed below.      Follow Up Recommendations SNF    Equipment Recommendations  Rolling walker with 5" wheels    Recommendations for Other Services Speech consult     Precautions / Restrictions Precautions Precautions: Back Precaution Booklet Issued: Yes (comment) Precaution Comments: Reviewed handout and precautions with patient and family members, reviewed donning and doffing brace sitting Required Braces or Orthoses: Spinal Brace Spinal Brace: Lumbar corset;Applied in sitting position Restrictions Weight Bearing Restrictions: No      Mobility  Bed Mobility Overal bed mobility: Needs Assistance Bed Mobility: Rolling;Sidelying to Sit Rolling: Mod assist Sidelying to sit: Mod assist       General bed mobility comments: Max v/c's needed for log roll and maintaining precautions, physical assist needed for trunk and leg management from sidelying to sit  Transfers Overall  transfer level: Needs assistance Equipment used: Rolling walker (2 wheeled) Transfers: Sit to/from Stand Sit to Stand: Mod assist Stand pivot transfers: Min assist;+2 physical assistance       General transfer comment: Needed max v/c's for safe hand placement, difficulty initiating (may have to do with Parkinson's), took multiple attempts and mod physical assist to help up to standing  Ambulation/Gait Ambulation/Gait assistance: Mod assist Ambulation Distance (Feet): 5 Feet Assistive device: Rolling walker (2 wheeled) Gait Pattern/deviations: Step-to pattern;Decreased stride length;Shuffle Gait velocity: Bradykinesia, may be due to Parkinsons (Extremely slow (bradykinesia) may be due to Parkinson's) Gait velocity interpretation: Below normal speed for age/gender General Gait Details: Max v/c's needed, bradykinesia and slow initiation may be due to Parkinson's, more verbal cueing needed for initiation of LLE movement/management   Stairs            Wheelchair Mobility    Modified Rankin (Stroke Patients Only)       Balance Overall balance assessment: Needs assistance Sitting-balance support: Bilateral upper extremity supported   Sitting balance - Comments: faigues quickly in unsupported sitting   Standing balance support: Bilateral upper extremity supported Standing balance-Leahy Scale: Poor Standing balance comment: needs RW for support                             Pertinent Vitals/Pain Pain Assessment: 0-10 Pain Score: 9  Pain Location: back; anterior-lateral LT thigh radiating to ankle Pain Descriptors / Indicators: Aching;Numbness;Radiating;Sharp Pain Intervention(s): Limited activity within patient's tolerance    Home Living Family/patient expects to be discharged to:: Private residence Living Arrangements: Other relatives Available Help at Discharge: Family;Available 24 hours/day Type of  Home: House Home Access: Stairs to enter Entrance  Stairs-Rails: None Entrance Stairs-Number of Steps: 2 Home Layout: One level Home Equipment: Cane - single point Additional Comments: Sister is available to provide 24 hour supervision, does not currently work    Prior Function Level of Independence: Needs assistance   Gait / Transfers Assistance Needed: Radio producer  ADL's / Homemaking Assistance Needed: Major cooking and cleaning is done by sister  Comments: Previously able to bath herself, has tub with shower chair in it     Hand Dominance   Dominant Hand: Right    Extremity/Trunk Assessment   Upper Extremity Assessment: Generalized weakness RUE Deficits / Details: Strength grossly 4/5     LUE Deficits / Details: Strength LUE grossly 3+/5   Lower Extremity Assessment: Defer to PT evaluation;LLE deficits/detail (weakness noted LLE)   LLE Deficits / Details: Numbness on top of L foot, pain/aching in L leg grossly 4-/5  Cervical / Trunk Assessment: Normal  Communication   Communication: No difficulties  Cognition Arousal/Alertness: Awake/alert Behavior During Therapy: WFL for tasks assessed/performed Overall Cognitive Status: Within Functional Limits for tasks assessed       Memory: Decreased recall of precautions              General Comments      Exercises        Assessment/Plan    PT Assessment Patient needs continued PT services  PT Diagnosis Difficulty walking;Abnormality of gait;Generalized weakness;Acute pain   PT Problem List Decreased strength;Decreased range of motion;Decreased activity tolerance;Decreased balance;Decreased mobility;Decreased coordination;Decreased knowledge of use of DME;Decreased safety awareness;Decreased knowledge of precautions;Impaired sensation;Obesity;Pain  PT Treatment Interventions DME instruction;Gait training;Stair training;Functional mobility training;Therapeutic activities;Balance training;Neuromuscular re-education;Patient/family education   PT Goals (Current goals can  be found in the Care Plan section) Acute Rehab PT Goals Patient Stated Goal: Return home with sister PT Goal Formulation: With patient/family Time For Goal Achievement: 10/19/14 Potential to Achieve Goals: Poor    Frequency Min 5X/week   Barriers to discharge Decreased caregiver support sister can only provide min level care    Co-evaluation               End of Session Equipment Utilized During Treatment: Gait belt;Back brace Activity Tolerance: Patient limited by fatigue;Patient limited by pain Patient left: in chair;with call bell/phone within reach;with family/visitor present Nurse Communication: Mobility status         Time: 0829-0925 PT Time Calculation (min) (ACUTE ONLY): 56 min   Charges:   PT Evaluation $Initial PT Evaluation Tier I: 1 Procedure PT Treatments $Gait Training: 8-22 mins $Therapeutic Activity: 23-37 mins   PT G Codes:        Woodard Perrell 11/01/2014, 2:31 PM  Lucas Mallow, SPT (student physical therapist) Office phone: [phone]

## 2014-10-05 NOTE — Clinical Social Work Placement (Addendum)
Clinical Social Work Department CLINICAL SOCIAL WORK PLACEMENT NOTE 10/05/2014  Patient:  HALLI, EQUIHUA  Account Number:  [id number] Admit date:  10/04/2014  Clinical Social Worker:  Paulette Blanch Antone Summons, LCSWA  Date/time:  10/05/2014 11:29 AM  Clinical Social Work is seeking post-discharge placement for this patient at the following level of care:   SKILLED NURSING   (*CSW will update this form in Epic as items are completed)   10/05/2014  Patient/family provided with [facility] Department of Clinical Social Work's list of facilities offering this level of care within the geographic area requested by the patient (or if unable, by the patient's family).  10/05/2014  Patient/family informed of their freedom to choose among providers that offer the needed level of care, that participate in Medicare, Medicaid or managed care program needed by the patient, have an available bed and are willing to accept the patient.  10/05/2014  Patient/family informed of [facility]' ownership interest in [facility], as well as of the fact that they are under no obligation to receive care at this facility.  PASARR submitted to EDS on 10/05/2014 PASARR number received on 10/05/2014  FL2 transmitted to all facilities in geographic area requested by pt/family on  10/05/2014 FL2 transmitted to all facilities within larger geographic area on 10/05/2014  Patient informed that his/her managed care company has contracts with or will negotiate with  certain facilities, including the following:     Patient/family informed of bed offers received: 10/06/2014 Patient chooses bed at [facility]  Physician recommends and patient chooses bed at    Patient to be transferred to [facility] on 10/07/2014 Patient to be transferred to facility by [facility]  Patient and family notified of transfer on 10/07/2014 Name of family member notified: [name]   The following physician request were entered in  Epic:   Additional Comments:  [place], MSW, [place]

## 2014-10-05 NOTE — Progress Notes (Deleted)
D/C orders received, pt for D/C home today.  IV and telemetry D/C.  Rx and D/C instructions given with verbalized understanding.  Family at bedside to assist with D/C.  Staff brought pt downstairs via wheelchair.  

## 2014-10-05 NOTE — Progress Notes (Signed)
Subjective: Patient reports Overall doing okay back pain currently being managed on medications so as left leg pain but not as bad as it was preoperatively is experiencing a little bit numbness in the last couple fingers of her left hand.  Objective: Vital signs in last 24 hours: Temp:  [97.7 F (36.5 C)-98 F (36.7 C)] 97.7 F (36.5 C) (04/12 0531) Pulse Rate:  [68-95] 76 (04/12 0531) Resp:  [14-27] 18 (04/12 0531) BP: (81-168)/(34-92) 109/43 mmHg (04/12 0531) SpO2:  [95 %-99 %] 96 % (04/12 0531)  Intake/Output from previous day: 04/11 0701 - 04/12 0700 In: 3073 [[name].O.:270; I.V.:2353; Blood:200; IV Piggyback:250] Out: 1080 [Urine:480; Blood:600] Intake/Output this shift:    Looks to be strength is 5 out of 5 wound is clean dry and intact she has a little bit of what appears to be effort-dependent weakness in the left upper extremity it is extremity has the IV in it but grip strength and triceps seems to be 4+ out of 5.  Lab Results: No results for input(s): WBC, HGB, HCT, PLT in the last 72 hours. BMET No results for input(s): NA, K, CL, CO2, GLUCOSE, BUN, CREATININE, CALCIUM in the last 72 hours.  Studies/Results: Dg Lumbar Spine 2-3 Views  10/04/2014   CLINICAL DATA:  Lumbar fusion.  EXAM: DG C-ARM 61-120 MIN; LUMBAR SPINE - 2-3 VIEW  COMPARISON:  MRI lumbar spine 05/31/2014  FINDINGS: Fluoroscopic spot images demonstrate bilateral pedicle screws at L4, L5 and S1. Good position without complicating features. There are interbody fusion devices at L4-5 and L5-S1.  IMPRESSION: Fusion hardware in good position without complicating features.   Electronically Signed   By: Marijo Sanes M.D.   On: 10/04/2014 12:20   Dg C-arm 61-120 Min  10/04/2014   CLINICAL DATA:  Lumbar fusion.  EXAM: DG C-ARM 61-120 MIN; LUMBAR SPINE - 2-3 VIEW  COMPARISON:  MRI lumbar spine 05/31/2014  FINDINGS: Fluoroscopic spot images demonstrate bilateral pedicle screws at L4, L5 and S1. Good position without  complicating features. There are interbody fusion devices at L4-5 and L5-S1.  IMPRESSION: Fusion hardware in good position without complicating features.   Electronically Signed   By: Marijo Sanes M.D.   On: 10/04/2014 12:20    Assessment/Plan: Continue to mobilize with physical and occupational therapy in her brace feel left upper extremity irritation could be an effect of the IV it's also possible she could have an ulnar nerve aggravation from positioning in surgery. We'll continue to observe give her a couple doses of IV Decadron and mobilize her and see how she does over the next 24-48 hours.  LOS: 1 day     [name] 10/05/2014, 7:28 AM

## 2014-10-05 NOTE — Clinical Social Work Psychosocial (Signed)
Clinical Social Work Department BRIEF PSYCHOSOCIAL ASSESSMENT 10/05/2014  Patient:  [name], [name]     Account Number:  [id number]     Admit date:  10/04/2014  Clinical Social Worker:  Marciano Sequin  Date/Time:  10/05/2014 11:19 AM  Referred by:  RN  Date Referred:  10/05/2014 Referred for  SNF Placement   Other Referral:   Interview type:  Patient Other interview type:    PSYCHOSOCIAL DATA Living Status:  ALONE Admitted from facility:   Level of care:   Primary support name:  Pressley,Sandra Primary support relationship to patient:  SIBLING Degree of support available:   Strong Support    CURRENT CONCERNS Current Concerns  None Noted   Other Concerns:    SOCIAL WORK ASSESSMENT / PLAN CSW met the pt and the pt's sister [name] at the bedside. CSW introduced self and purpose of the visit. CSW discussed clinical recommendation for SNF rehab. CSW inquired about the geographical location in which the pt would like to receive rehab from. CSW explained the SNF rehab process to the pt. CSW and pt discussed insurance and its relation to SNF rehab. CSW answered all questions in which the pt inquired about. CSW provided pt with contact information for further questions. CSW will continue to follow this pt and assist with discharge as needed.   Assessment/plan status:  Psychosocial Support/Ongoing Assessment of Needs Other assessment/ plan:   Information/referral to community resources:    PATIENT'S/FAMILY'S RESPONSE TO CURRENT DIAGNOSE: [name] presented with a calm mood and content affect. [name] reported explained the pt's medical condition prior to back surgery. [name] acknowledged preexisting conflict with mobility. [name] identity the pt left side is weaker than usual. [name] expressed desire for the pt to receive rehab.     PATIENT'S/FAMILY'S RESPONSE TO PLAN OF CARE: Pt expressed a desire to go home. [name] explained to the pt that rehab is need before she can go home. Pt  aksed to be left alone with her sister to discuss the options.    [place], MSW, [place]

## 2014-10-06 ENCOUNTER — Inpatient Hospital Stay (HOSPITAL_COMMUNITY): Payer: Commercial Managed Care - HMO

## 2014-10-06 LAB — GLUCOSE, CAPILLARY
Glucose-Capillary: 191 mg/dL — ABNORMAL HIGH (ref 70–99)
Glucose-Capillary: 182 mg/dL — ABNORMAL HIGH (ref 70–99)
Glucose-Capillary: 103 mg/dL — ABNORMAL HIGH (ref 70–99)
Glucose-Capillary: 124 mg/dL — ABNORMAL HIGH (ref 70–99)

## 2014-10-06 LAB — BASIC METABOLIC PANEL
Anion gap: 8 (ref 5–15)
BUN: 22 mg/dL (ref 6–23)
CO2: 27 mmol/L (ref 19–32)
Calcium: 8.8 mg/dL (ref 8.4–10.5)
Chloride: 93 mmol/L — ABNORMAL LOW (ref 96–112)
Creatinine, Ser: 1.64 mg/dL — ABNORMAL HIGH (ref 0.50–1.10)
GFR calc Af Amer: 37 mL/min — ABNORMAL LOW (ref >90)
GFR calc non Af Amer: 32 mL/min — ABNORMAL LOW (ref >90)
Glucose, Bld: 181 mg/dL — ABNORMAL HIGH (ref 70–99)
Potassium: 3.8 mmol/L (ref 3.5–5.1)
Sodium: 128 mmol/L — ABNORMAL LOW (ref 135–145)

## 2014-10-06 MED ORDER — GABAPENTIN 300 MG PO CAPS
300.0000 mg | ORAL_CAPSULE | Freq: Every day | ORAL | Status: DC
  Administered 2014-10-06: 300 mg via ORAL
  Filled 2014-10-06: qty 1

## 2014-10-06 MED ORDER — VANCOMYCIN HCL 10 G IV SOLR
1250.0000 mg | INTRAVENOUS | Status: DC
  Filled 2014-10-06: qty 1250

## 2014-10-06 MED ORDER — SODIUM CHLORIDE 0.9 % IV SOLN
INTRAVENOUS | Status: DC
  Administered 2014-10-06 – 2014-10-07 (×2): via INTRAVENOUS

## 2014-10-06 NOTE — Clinical Social Work Note (Signed)
CSW met the pt at the bedside. The pt expressed interest in [facility]. Once pt is medically stable she can transition there. CSW will continue to assist with discharge needs.  [place], MSW, [place]

## 2014-10-06 NOTE — Progress Notes (Signed)
Patient is not able to control urination, she feels the urge but is unable to control its discharge. She did not have this issue prior to surgery she explained. Will continue to monitor.

## 2014-10-06 NOTE — Progress Notes (Signed)
Physical Therapy Treatment Patient Details Name: [name] MRN: [id number] DOB: 12-18-1945 Today's Date: 10/06/2014    History of Present Illness Pt s/p L4-L5,  L5-S1 PILF.  PMH: parkinson's, DM, HTN, diastolic heart failure, peripheral edema/neuropathy.     PT Comments    Pt progressing towards PT goals.  Pt was able to ambulate 15 feet with max v/c's for initiation of bilateral LE movement, but was limited by fatigue and feeling light headed.  Pt still demonstrates L sided weakness with numbness in the thigh/foot as well as difficulty initiating movement bilaterally.  Current plan for d/c to SNF remains appropriate.  Pt will benefit from continued skilled PT to address remaining deficits.   Follow Up Recommendations  SNF     Equipment Recommendations  Rolling walker with 5" wheels    Recommendations for Other Services       Precautions / Restrictions Precautions Precautions: Back Precaution Comments: Pt received sitting in chair with brace on, had assistance donning it this am Required Braces or Orthoses: Spinal Brace Spinal Brace: Lumbar corset;Applied in sitting position Restrictions Weight Bearing Restrictions: No    Mobility  Bed Mobility               General bed mobility comments: Pt received sitting in chair  Transfers Overall transfer level: Needs assistance Equipment used: Rolling walker (2 wheeled) Transfers: Sit to/from Stand Sit to Stand: Mod assist         General transfer comment: Demonstrate safe hand placement for transfer, still needs mod/max v/c's and tactile cues for initiation of movement  Ambulation/Gait Ambulation/Gait assistance: Mod assist;+2 safety/equipment (One person with chair, one person guarding and managing IV p) Ambulation Distance (Feet): 15 Feet Assistive device: Rolling walker (2 wheeled) Gait Pattern/deviations: Step-to pattern;Shuffle;Decreased weight shift to left Gait velocity: Bradykinesia, may be due to  Parkinsons Gait velocity interpretation: <1.8 ft/sec, indicative of risk for recurrent falls General Gait Details: Max v/c's for inititation of movement of B LE, today initiation during ambulation was more difficult on R, but was delayed and slow bilaterally. Pt complained of L hip pain with ambulation and distance was limited by fatigue and feeling light headed/dizzy   Stairs            Wheelchair Mobility    Modified Rankin (Stroke Patients Only)       Balance Overall balance assessment: Needs assistance         Standing balance support: Bilateral upper extremity supported;During functional activity Standing balance-Leahy Scale: Poor Standing balance comment: Relies heavily on B UE on RW for support during functional activity                    Cognition Arousal/Alertness: Lethargic;Suspect due to medications (Pt just received all of her meds at the beginning of session) Behavior During Therapy: [facility] for tasks assessed/performed Overall Cognitive Status: Within Functional Limits for tasks assessed (Pt with slow processing due to lethargy)                      Exercises General Exercises - Lower Extremity Ankle Circles/Pumps: AROM;Both;10 reps;Seated Long Arc Quad: AROM;10 reps;Both;Seated (Difficulty initiating and performing ROM on L)    General Comments General comments (skin integrity, edema, etc.): Presented with increased weakness and pain on the L side (UE/LE), with more numbness in the L leg and decreased ROM L LE.  Pt had difficulty performing full ROM and LE execises on the L.  Pertinent Vitals/Pain Pain Assessment: 0-10 Pain Score: 9  Pain Location: Back; L anterior thigh, ankle and foot  Pain Descriptors / Indicators: Sharp Pain Intervention(s): Monitored during session;Premedicated before session;Limited activity within patient's tolerance    Home Living                      Prior Function            PT Goals  (current goals can now be found in the care plan section) Progress towards PT goals: Progressing toward goals    Frequency  Min 5X/week    PT Plan Current plan remains appropriate    Co-evaluation             End of Session Equipment Utilized During Treatment: Gait belt;Back brace Activity Tolerance: Patient limited by fatigue;Patient limited by pain Patient left: in chair;with call bell/phone within reach;with family/visitor present     Time: 1031-2811 PT Time Calculation (min) (ACUTE ONLY): 34 min  Charges:  $Gait Training: 8-22 mins $Therapeutic Activity: 8-22 mins                    G Codes:      Jager Koska 10/25/14, 11:19 AM  Lucas Mallow, SPT (student physical therapist) Office phone: [phone]

## 2014-10-06 NOTE — Progress Notes (Signed)
ANTIBIOTIC CONSULT NOTE - FOLLOW UP  Pharmacy Consult for Vancomycin Indication: Post-op coverage while drain in place  Allergies  Allergen Reactions  . Codeine     Headache and "just feel off"  . Penicillins Hives  . Sulfa Antibiotics Hives  . Other Rash    Bleach    Patient Measurements: Height: 5\' 1"  (154.9 cm) Weight: 276 lb 3.2 oz (125.283 kg) IBW/kg (Calculated) : 47.8  Vital Signs: Temp: 98.4 F (36.9 C) (04/13 0951) Temp Source: Oral (04/13 0951) BP: 118/64 mmHg (04/13 0951) Pulse Rate: 72 (04/13 0951) Intake/Output from previous day: 04/12 0701 - 04/13 0700 In: 3 [I.V.:3] Out: 1055 [Urine:675; Drains:380] Intake/Output from this shift:    Labs:  Recent Labs  10/06/14 1157  CREATININE 1.64*   Estimated Creatinine Clearance: 42 mL/min (by C-G formula based on Cr of 1.64). No results for input(s): VANCOTROUGH, VANCOPEAK, VANCORANDOM, GENTTROUGH, GENTPEAK, GENTRANDOM, TOBRATROUGH, TOBRAPEAK, TOBRARND, AMIKACINPEAK, AMIKACINTROU, AMIKACIN in the last 72 hours.   Microbiology: Recent Results (from the past 720 hour(s))  Surgical pcr screen     Status: None   Collection Time: 09/21/14 11:27 AM  Result Value Ref Range Status   MRSA, PCR NEGATIVE NEGATIVE Final   Staphylococcus aureus NEGATIVE NEGATIVE Final    Comment:        The Xpert SA Assay (FDA approved for NASAL specimens in patients over 67 years of age), is one component of a comprehensive surveillance program.  Test performance has been validated by [facility] for patients greater than or equal to 67 year old. It is not intended to diagnose infection nor to guide or monitor treatment.     Anti-infectives    Start     Dose/Rate Route Frequency Ordered Stop   10/07/14 0630  vancomycin (VANCOCIN) 1,250 mg in sodium chloride 0.9 % 250 mL IVPB     1,250 mg 166.7 mL/hr over 90 Minutes Intravenous Every 24 hours 10/06/14 1418     10/04/14 2200  doxycycline (VIBRA-TABS) tablet 100 mg  Status:   Discontinued     100 mg Oral Every 12 hours 10/04/14 1412 10/04/14 1427   10/04/14 1800  vancomycin (VANCOCIN) IVPB 1000 mg/200 mL premix  Status:  Discontinued     1,000 mg 200 mL/hr over 60 Minutes Intravenous Every 12 hours 10/04/14 1426 10/06/14 1418   10/04/14 1415  doxycycline (VIBRAMYCIN) capsule 100 mg  Status:  Discontinued     100 mg Oral 2 times daily 10/04/14 1402 10/04/14 1411   10/04/14 1132  vancomycin (VANCOCIN) powder  Status:  Discontinued       As needed 10/04/14 1132 10/04/14 1213   10/04/14 1127  vancomycin (VANCOCIN) 1000 MG powder    Comments:  Day, Dory   : cabinet override      10/04/14 1127 10/04/14 2344   10/04/14 0840  bacitracin 50,000 Units in sodium chloride irrigation 0.9 % 500 mL irrigation  Status:  Discontinued       As needed 10/04/14 0841 10/04/14 1213   10/04/14 0600  vancomycin (VANCOCIN) 1,500 mg in sodium chloride 0.9 % 500 mL IVPB     1,500 mg 250 mL/hr over 120 Minutes Intravenous On call to O.R. 10/03/14 1307 10/04/14 0804      Assessment: 69 YOF who continues on Vancomycin s/p back surgery on 4/11. Renal function was noted to be worse compared to pre-op labs, SCr 1.64 << 0.76 (pre-op labs from 3/29), estimated CrCl<50 ml/min. Will recheck a SCr and VR on 4/14 AM.  Goal of Therapy:  Vancomycin trough level 10-15 mcg/ml  Plan:  1. Adjust Vancomycin to 1250 mg IV every 24 hours (starting on 4/14) 2. Obtain a Vancomycin random level on 4/14 AM 3. Will continue to follow renal function, culture results, LOT, and antibiotic de-escalation plans   Alycia Rossetti, PharmD, BCPS Clinical Pharmacist Pager: [phone] 10/06/2014 2:26 PM

## 2014-10-06 NOTE — Progress Notes (Signed)
Inpatient Diabetes Program Recommendations  AACE/ADA: New Consensus Statement on Inpatient Glycemic Control (2013)  Target Ranges:  Prepandial:   less than 140 mg/dL      Peak postprandial:   less than 180 mg/dL (1-2 hours)      Critically ill patients:  140 - 180 mg/dL   Reason for Visit: Hyperglycemia  Results for LYNNMARIE, LOVETT (MRN [id number]) as of 10/06/2014 11:30  Ref. Range 10/05/2014 06:47 10/05/2014 11:43 10/05/2014 16:51 10/05/2014 22:04 10/06/2014 06:58  Glucose-Capillary Latest Ref Range: 70-99 mg/dL 195 (H) 137 (H) 243 (H) 258 (H) 191 (H)   Needs basal insulin.  Inpatient Diabetes Program Recommendations Insulin - Basal: Consider addition of Lantus 15 units QHS (Pt takes 32 units at home)  Note: Will continue to follow. Thank you. Lorenda Peck, RD, LDN, CDE Inpatient Diabetes Coordinator [phone]

## 2014-10-06 NOTE — Progress Notes (Signed)
Subjective: Patient reports Back pain stable worsening left anterior quad pain no numbness or tingling  Objective: Vital signs in last 24 hours: Temp:  [97.8 F (36.6 C)-98.4 F (36.9 C)] 98.4 F (36.9 C) (04/13 0951) Pulse Rate:  [67-76] 72 (04/13 0951) Resp:  [16-20] 20 (04/13 0951) BP: (107-123)/(43-64) 118/64 mmHg (04/13 0951) SpO2:  [93 %-96 %] 95 % (04/13 0951)  Intake/Output from previous day: 04/12 0701 - 04/13 0700 In: 3 [I.V.:3] Out: 1055 [Urine:675; Drains:380] Intake/Output this shift:    Wound clean dry and intact strength 5 out of 5  Lab Results: No results for input(s): WBC, HGB, HCT, PLT in the last 72 hours. BMET  Recent Labs  10/06/14 1157  NA 128*  K 3.8  CL 93*  CO2 27  GLUCOSE 181*  BUN 22  CREATININE 1.64*  CALCIUM 8.8    Studies/Results: No results found.  Assessment/Plan: Postoperative day 2 with an L3 radiculitis. We'll start Neurontin and order a CT scan of her lumbar spine. Continue mobilize with physical therapy.  LOS: 2 days     [name] P 10/06/2014, 2:00 PM

## 2014-10-07 DIAGNOSIS — R41841 Cognitive communication deficit: Secondary | ICD-10-CM | POA: Diagnosis not present

## 2014-10-07 DIAGNOSIS — M4326 Fusion of spine, lumbar region: Secondary | ICD-10-CM | POA: Diagnosis not present

## 2014-10-07 DIAGNOSIS — E1140 Type 2 diabetes mellitus with diabetic neuropathy, unspecified: Secondary | ICD-10-CM | POA: Diagnosis not present

## 2014-10-07 DIAGNOSIS — F319 Bipolar disorder, unspecified: Secondary | ICD-10-CM | POA: Diagnosis not present

## 2014-10-07 DIAGNOSIS — R262 Difficulty in walking, not elsewhere classified: Secondary | ICD-10-CM | POA: Diagnosis not present

## 2014-10-07 DIAGNOSIS — M4806 Spinal stenosis, lumbar region: Secondary | ICD-10-CM | POA: Diagnosis not present

## 2014-10-07 DIAGNOSIS — M5416 Radiculopathy, lumbar region: Secondary | ICD-10-CM | POA: Diagnosis not present

## 2014-10-07 DIAGNOSIS — G20 Parkinson's disease: Secondary | ICD-10-CM | POA: Diagnosis not present

## 2014-10-07 DIAGNOSIS — F324 Major depressive disorder, single episode, in partial remission: Secondary | ICD-10-CM | POA: Diagnosis not present

## 2014-10-07 DIAGNOSIS — I10 Essential (primary) hypertension: Secondary | ICD-10-CM | POA: Diagnosis not present

## 2014-10-07 DIAGNOSIS — Z9889 Other specified postprocedural states: Secondary | ICD-10-CM | POA: Diagnosis not present

## 2014-10-07 DIAGNOSIS — M4807 Spinal stenosis, lumbosacral region: Secondary | ICD-10-CM | POA: Diagnosis not present

## 2014-10-07 DIAGNOSIS — M6281 Muscle weakness (generalized): Secondary | ICD-10-CM | POA: Diagnosis not present

## 2014-10-07 LAB — BASIC METABOLIC PANEL
Anion gap: 7 (ref 5–15)
Anion gap: 9 (ref 5–15)
BUN: 23 mg/dL (ref 6–23)
BUN: 23 mg/dL (ref 6–23)
CO2: 24 mmol/L (ref 19–32)
CO2: 21 mmol/L (ref 19–32)
Calcium: 8.3 mg/dL — ABNORMAL LOW (ref 8.4–10.5)
Calcium: 8.7 mg/dL (ref 8.4–10.5)
Chloride: 96 mmol/L (ref 96–112)
Chloride: 98 mmol/L (ref 96–112)
Creatinine, Ser: 1.48 mg/dL — ABNORMAL HIGH (ref 0.50–1.10)
Creatinine, Ser: 1.44 mg/dL — ABNORMAL HIGH (ref 0.50–1.10)
GFR calc Af Amer: 41 mL/min — ABNORMAL LOW (ref >90)
GFR calc Af Amer: 43 mL/min — ABNORMAL LOW (ref >90)
GFR calc non Af Amer: 36 mL/min — ABNORMAL LOW (ref >90)
GFR calc non Af Amer: 37 mL/min — ABNORMAL LOW (ref >90)
Glucose, Bld: 154 mg/dL — ABNORMAL HIGH (ref 70–99)
Glucose, Bld: 122 mg/dL — ABNORMAL HIGH (ref 70–99)
Potassium: 4.7 mmol/L (ref 3.5–5.1)
Potassium: 4.4 mmol/L (ref 3.5–5.1)
Sodium: 127 mmol/L — ABNORMAL LOW (ref 135–145)
Sodium: 128 mmol/L — ABNORMAL LOW (ref 135–145)

## 2014-10-07 LAB — VANCOMYCIN, RANDOM: Vancomycin Rm: 22.5 ug/mL

## 2014-10-07 LAB — GLUCOSE, CAPILLARY
Glucose-Capillary: 143 mg/dL — ABNORMAL HIGH (ref 70–99)
Glucose-Capillary: 117 mg/dL — ABNORMAL HIGH (ref 70–99)

## 2014-10-07 NOTE — Progress Notes (Signed)
Pt lethargic this am unable to administer medication. Therapist attempted to work with pt but informed this nurse that pt was not like herself and could not stay awake. Assessment done, BP noted 90/44. Pt able to follow simple commands. Neuro intact but she kept falling asleep during assessment. Pt stated that she doesn't really take muscle relaxants and when she does she gets really drowsy.  Spoke with MD, instructed to hold pain and muscle relaxant medication and order at stat BMP. No noted distress O2 sat 100%. Will continue to monitor. Sister at bedside. Call bell within reach.

## 2014-10-07 NOTE — Progress Notes (Signed)
Physical Therapy Treatment Patient Details Name: [name] MRN: [id number] DOB: 08/26/1945 Today's Date: 10/07/2014    History of Present Illness Pt s/p L4-L5,  L5-S1 PILF.  PMH: parkinson's, DM, HTN, diastolic heart failure, peripheral edema/neuropathy.     PT Comments    Pt demonstrated significant cognitive changes and changes in functional capability this morning.  Attempted to see pt at 8am and she was eating breakfast.  When PT returned a little after 9, pt was very difficult to arouse, and did not recall having breakfast. Pt also demonstrated significant abdominal distention, and has not had a BM since 4/11.  Back brace difficult to apply due to abdominal distention.   Treatment was limited by lethargy, suspect due to medications, and pt required assist +2 today for all functional mobility. Nursing was notified of changes in cognition and abdominal distention.   Continue with skilled PT to address remaining functional deficits, current plan to d/c to SNF remains appropriate once pt is medically cleared.   Follow Up Recommendations  SNF     Equipment Recommendations  Rolling walker with 5" wheels    Recommendations for Other Services       Precautions / Restrictions Precautions Precautions: Back Precaution Comments: Pt lethargic and with altered mental status due to medications; unable to recall precautions this morning Required Braces or Orthoses: Spinal Brace (Difficult to apply due to abdominal distention) Spinal Brace: Lumbar corset;Applied in sitting position Restrictions Weight Bearing Restrictions: No    Mobility  Bed Mobility Overal bed mobility: +2 for physical assistance Bed Mobility: Rolling;Sidelying to Sit Rolling: Mod assist;+2 for physical assistance Sidelying to sit: Mod assist       General bed mobility comments: Pt required assist +2 for bed mobility today.  Pt needed 1 person for LE management bringing legs off the bed and 1 person for torso  management (maintaining neutral spine from sidelying to sitting and support for back/hips with rolling) in order to maintain precautions.  Transfers Overall transfer level: Needs assistance Equipment used: Rolling walker (2 wheeled) Transfers: Sit to/from [facility] Sit to Stand: Max assist;+2 safety/equipment Stand pivot transfers: Max assist;+2 safety/equipment       General transfer comment: Still needed max v/c's for hand placement and maintaining precautions, took multiple attempts to stand.  Had to pivot transfer to commode, and then stood up for transition to chair, which was rolled under hips.   Ambulation/Gait             General Gait Details: Unable to attempt ambulation today due to lethargy and difficulty with sitting to standing/functional mobility   Stairs            Wheelchair Mobility    Modified Rankin (Stroke Patients Only)       Balance Overall balance assessment: Needs assistance         Standing balance support: Bilateral upper extremity supported;During functional activity (With external support with gait belt) Standing balance-Leahy Scale: Poor Standing balance comment: Relied heavilty on BUE and external support, difficulty with WB on LLE                    Cognition Arousal/Alertness: Lethargic;Suspect due to medications Behavior During Therapy: Flat affect Overall Cognitive Status: Impaired/Different from baseline Area of Impairment: Safety/judgement;Awareness;Problem solving;Memory     Memory: Decreased recall of precautions   Safety/Judgement: Decreased awareness of safety Awareness: Intellectual Problem Solving: Slow processing;Decreased initiation;Difficulty sequencing;Requires verbal cues;Requires tactile cues General Comments: Pt difficult to arouse this  morning.  Delayed treatment at 8am due to breakfast; pt was awake and alert at this time.  When we can in a little after 9am, pt did not recall eating  breakfast and was difficult to arouse.     Exercises General Exercises - Lower Extremity Long Arc Quad: AROM;Left;Right;5 reps;Seated    General Comments General comments (skin integrity, edema, etc.): Pt with significant abdominal distention and has not had a BM since 4/11.  Brace was difficult to apply this am due to distention.  Nursing was notified of changes in cognition and distention.       Pertinent Vitals/Pain Pain Assessment: Faces Faces Pain Scale: Hurts even more Pain Location: Leg and foot still numb, pt unable to communicate other pain Pain Intervention(s): Monitored during session    Home Living                      Prior Function            PT Goals (current goals can now be found in the care plan section) Progress towards PT goals:Not progressing towards goals, due to lethargy this session    Frequency  Min 5X/week    PT Plan Current plan remains appropriate    Co-evaluation             End of Session Equipment Utilized During Treatment: Gait belt;Back brace Activity Tolerance: Patient limited by fatigue;Patient limited by pain Patient left: in chair;with call bell/phone within reach     Time: 0911-0941 PT Time Calculation (min) (ACUTE ONLY): 30 min  Charges:  $Therapeutic Activity: 23-37 mins                    G Codes:      Earon Rivest 2014-10-27, 10:13 AM  Lucas Mallow, SPT (student physical therapist) Office phone: [phone]

## 2014-10-07 NOTE — Progress Notes (Signed)
UR COMPLETED  

## 2014-10-07 NOTE — Discharge Summary (Signed)
Physician Discharge Summary  Patient ID: [name] MRN: [id number] DOB/AGE: September 04, 1945 69 y.o.  Admit date: 10/04/2014 Discharge date: 10/07/2014  Admission Diagnoses:lumbar spondylosis and stenosis L4 L5 and L5-S1  Discharge Diagnoses: ssame Active Problems:   Spinal stenosis at L4-L5 level   Discharged Condition: good  Hospital Course: patient was admitted to the hospital and underwent a decompressive  Laminectomy and fusion at L4 L5 and at L5-S1.  Postoperatively she did very well went to the recovery room and then to the floor.  She had a lot of postoperative pain that came under much better control by postop day 3.  CT scan of her lumbar spine showed good position of all implants.  Laboratory showed mild hyponatremia that seemed to be improving.this should be followed in the nursing facility in 1-2 days to recheck her sodium as well as BUN and creatinine.she showed a slight increase in her creatinine postoperatively that improved with hydration.  Patient displayed sensitivities to pain medication and muscle relaxers that over sedated her so these were held on postoperative day 3.  Scheduled follow-up in 1-2 weeks.  Consults: Significant Diagnostic Studies: Treatments:L4 L5 L5-S1 decompression and fusion Discharge Exam: Blood pressure 122/59, pulse 57, temperature 98.5 F (36.9 C), temperature source Oral, resp. rate 20, height _0  (1.549 m), weight 125.283 kg (276 lb 3.2 oz), SpO2 100 %. Strength 5 out of 5 wound clean and dry and intact  Disposition: SNF     Medication List    TAKE these medications        ACCU-CHEK FASTCLIX LANCETS Misc  Use to check blood sugar 3 times per day dx code E11.65     ACCU-CHEK NANO SMARTVIEW W/DEVICE Kit  Use to check blood sugar 3 times per day dx code E11.65     ACCU-CHEK SMARTVIEW CONTROL Liqd  Use as directed     ARIPiprazole 10 MG tablet  Commonly known as:  ABILIFY  Take 10 mg by mouth daily.     aspirin EC 81 MG  tablet  Take 1 tablet (81 mg total) by mouth at bedtime.     atorvastatin 40 MG tablet  Commonly known as:  LIPITOR  TAKE ONE TABLET BY MOUTH IN THE EVENING     B-D SINGLE USE SWABS REGULAR Pads  Use as directed     carbidopa-levodopa 25-100 MG per tablet  Commonly known as:  SINEMET IR  Take 1 tablet by mouth 3 (three) times daily.     cholestyramine 4 G packet  Commonly known as:  QUESTRAN  Take 4 g by mouth 2 (two) times daily as needed.     dextromethorphan 30 MG/5ML liquid  Commonly known as:  DELSYM  Take 15 mg by mouth as needed for cough.     doxycycline 100 MG capsule  Commonly known as:  VIBRAMYCIN  Take 100 mg by mouth 2 (two) times daily. Take for 10 Days     furosemide 40 MG tablet  Commonly known as:  LASIX  Take 40 mg by mouth every evening.     glucose blood test strip  Commonly known as:  ACCU-CHEK SMARTVIEW  accu-chek smart view test strips, use to check blood sugars 3 times per day DAW, dx code E11.65     HYDROcodone-acetaminophen 5-325 MG per tablet  Commonly known as:  NORCO/VICODIN  Take 1 tablet by mouth every 4 (four) hours as needed for moderate pain.     insulin lispro 100 UNIT/ML KiwkPen  Commonly known as:  HUMALOG  Inject 8 units at breakfast 8 units at lunch and 12 units at supper     isosorbide mononitrate 30 MG 24 hr tablet  Commonly known as:  IMDUR  Take 30 mg by mouth daily.     lamoTRIgine 150 MG tablet  Commonly known as:  LAMICTAL  Take 150 mg by mouth daily.     LANTUS SOLOSTAR 100 UNIT/ML Solostar Pen  Generic drug:  Insulin Glargine  INJECT 38 UNITS SUBCUTANEOUSLY ONCE DAILY AT  10  PM     Liraglutide 18 MG/3ML Sopn  Commonly known as:  VICTOZA  Inject 1.8 daily     meloxicam 15 MG tablet  Commonly known as:  MOBIC  Take 7.5 mg by mouth daily.     nebivolol 10 MG tablet  Commonly known as:  BYSTOLIC  Take 10 mg by mouth daily.     olmesartan 40 MG tablet  Commonly known as:  BENICAR  Take 40 mg by mouth  daily.     omega-3 acid ethyl esters 1 G capsule  Commonly known as:  LOVAZA  Take 1 g by mouth daily.     Pancrelipase (Lip-Prot-Amyl) 25000 UNITS Cpep  Take 5 capsules by mouth 3 (three) times daily. 2 caps at breakfast, 1 at lunch, 2 at dinner     potassium chloride SA 20 MEQ tablet  Commonly known as:  K-DUR,KLOR-CON  Take 20 mEq by mouth daily.     primidone 50 MG tablet  Commonly known as:  MYSOLINE  Take 50 mg by mouth at bedtime.     sertraline 100 MG tablet  Commonly known as:  ZOLOFT  Take 50 mg by mouth daily.           Follow-up Information    Follow up with [facility], MD.   Specialty:  Neurosurgery   Contact information:   [address] [place] [address] [phone]       Signed: Elaina Hoops 10/07/2014, 3:45 PM

## 2014-10-07 NOTE — Progress Notes (Signed)
Subjective: Patient reports Doing better this morning still some numbness in her anterior left quad but much improved.  Objective: Vital signs in last 24 hours: Temp:  [97.6 F (36.4 C)-98.9 F (37.2 C)] 98 F (36.7 C) (04/14 3335) Pulse Rate:  [58-72] 63 (04/14 0632) Resp:  [17-20] 18 (04/14 4562) BP: (110-122)/(50-64) 122/55 mmHg (04/14 0632) SpO2:  [92 %-100 %] 100 % (04/14 5638)  Intake/Output from previous day: 04/13 0701 - 04/14 0700 In: -  Out: 61 [Urine:1; Drains:60] Intake/Output this shift:    Strength out of 5 wound clean dry and intact  Lab Results: No results for input(s): WBC, HGB, HCT, PLT in the last 72 hours. BMET  Recent Labs  10/06/14 1157  NA 128*  K 3.8  CL 93*  CO2 27  GLUCOSE 181*  BUN 22  CREATININE 1.64*  CALCIUM 8.8    Studies/Results: Ct Lumbar Spine Wo Contrast  10/06/2014   CLINICAL DATA:  Lumbar surgery 2 days ago.  Radiculopathy.  EXAM: CT LUMBAR SPINE WITHOUT CONTRAST  TECHNIQUE: Multidetector CT imaging of the lumbar spine was performed without intravenous contrast administration. Multiplanar CT image reconstructions were also generated.  COMPARISON:  CT of the lumbar spine 08/19/2014  FINDINGS: A wide laminectomy and bilateral foraminotomies are noted at L4-5 and L5-S1. Disc spacers are in satisfactory position. There is no significant focal stenosis evident at the operative sites.  Pedicle screws are in satisfactory position bilaterally at L4, L5, and S1. Rods are in place.  A broad-based disc bulge and moderate facet hypertrophy at L3-4 is similar to the prior exam. No significant disc disease is present at L2-3 or L1-2.  IMPRESSION: 1. PLIF at L4-5 and L5-S1 without CT evidence for complication. 2. Stable disc bulge and facet hypertrophy at L3-4. 3. No focal stenosis is evident by CT.   Electronically Signed   By: San Morelle M.D.   On: 10/06/2014 19:10    Assessment/Plan: Continue to mobilize with physical and occupational  therapy okay for transfer to rehabilitation when bed available.  LOS: 3 days     Kohle Winner P 10/07/2014, 7:40 AM

## 2014-10-07 NOTE — Clinical Documentation Improvement (Signed)
Patient admitted on 10/04/2014 with severe lumbar spinal stenosis. Surgery on 10/04/2014. LOS 3 with below lab results. Please clarify if more specific diagnosis could be related to the results. Thank you.   Abnormal Lab and/or Testing Results: Component     Latest Ref Rng 10/06/2014 10/07/2014           Sodium     135 - 145 mmol/L 128 (L) 127 (L)  Chloride     96 - 112 mmol/L 93 (L) 96  Creatinine     0.50 - 1.10 mg/dL 1.64 (H) 1.48 (H)  EGFR (African American)     >90 mL/min 37 (L) 41 (L)    Treatment provided: NS @ 142m/hr continuous  Possible Clinical Conditions: Hyponatremia                                                Dehydration                                                Other ___ Thank you ,  GMelvia Heaps RN, BSN, CDI #[phone]

## 2014-10-07 NOTE — Progress Notes (Signed)
Hemovac removed per MD order emptied 75 ml. Applied dry dressing to site. Surgical site dressing changed clean, dry and intact. Pt denied pain or discomfort. Will continue to monitor.

## 2014-10-07 NOTE — Progress Notes (Signed)
Pt discharging via stretcher, [facility] transport. No noted distress. Neuro intact. Drowsy, but able to follow commands. Dicharge paperwork given to transporters. Her sister took all personal belongings to [facility] facility. Attempted x3 to call in report but unsuccessful.

## 2014-10-07 NOTE — Progress Notes (Signed)
ANTIBIOTIC CONSULT NOTE - FOLLOW UP  Pharmacy Consult for Vancomycin Indication: Post-op coverage while drain in place  Allergies  Allergen Reactions  . Codeine     Headache and "just feel off"  . Penicillins Hives  . Sulfa Antibiotics Hives  . Other Rash    Bleach    Patient Measurements: Height: 5\' 1"  (154.9 cm) Weight: 276 lb 3.2 oz (125.283 kg) IBW/kg (Calculated) : 47.8  Vital Signs: Temp: 98 F (36.7 C) (04/14 0632) Temp Source: Oral (04/14 2671) BP: 122/55 mmHg (04/14 2458) Pulse Rate: 63 (04/14 0998) Intake/Output from previous day: 04/13 0701 - 04/14 0700 In: -  Out: 61 [Urine:1; Drains:60] Intake/Output from this shift:    Labs:  Recent Labs  10/06/14 1157 10/07/14 0612  CREATININE 1.64* 1.48*   Estimated Creatinine Clearance: 46.5 mL/min (by C-G formula based on Cr of 1.48).  Recent Labs  10/07/14 0612  VANCORANDOM 22.5     Microbiology: Recent Results (from the past 720 hour(s))  Surgical pcr screen     Status: None   Collection Time: 09/21/14 11:27 AM  Result Value Ref Range Status   MRSA, PCR NEGATIVE NEGATIVE Final   Staphylococcus aureus NEGATIVE NEGATIVE Final    Comment:        The Xpert SA Assay (FDA approved for NASAL specimens in patients over 30 years of age), is one component of a comprehensive surveillance program.  Test performance has been validated by [facility] for patients greater than or equal to 8 year old. It is not intended to diagnose infection nor to guide or monitor treatment.     Anti-infectives    Start     Dose/Rate Route Frequency Ordered Stop   10/07/14 1200  vancomycin (VANCOCIN) 1,250 mg in sodium chloride 0.9 % 250 mL IVPB     1,250 mg 166.7 mL/hr over 90 Minutes Intravenous Every 24 hours 10/06/14 1418     10/04/14 2200  doxycycline (VIBRA-TABS) tablet 100 mg  Status:  Discontinued     100 mg Oral Every 12 hours 10/04/14 1412 10/04/14 1427   10/04/14 1800  vancomycin (VANCOCIN) IVPB 1000  mg/200 mL premix  Status:  Discontinued     1,000 mg 200 mL/hr over 60 Minutes Intravenous Every 12 hours 10/04/14 1426 10/06/14 1418   10/04/14 1415  doxycycline (VIBRAMYCIN) capsule 100 mg  Status:  Discontinued     100 mg Oral 2 times daily 10/04/14 1402 10/04/14 1411   10/04/14 1132  vancomycin (VANCOCIN) powder  Status:  Discontinued       As needed 10/04/14 1132 10/04/14 1213   10/04/14 1127  vancomycin (VANCOCIN) 1000 MG powder    Comments:  Day, Dory   : cabinet override      10/04/14 1127 10/04/14 2344   10/04/14 0840  bacitracin 50,000 Units in sodium chloride irrigation 0.9 % 500 mL irrigation  Status:  Discontinued       As needed 10/04/14 0841 10/04/14 1213   10/04/14 0600  vancomycin (VANCOCIN) 1,500 mg in sodium chloride 0.9 % 500 mL IVPB     1,500 mg 250 mL/hr over 120 Minutes Intravenous On call to O.R. 10/03/14 1307 10/04/14 0804      Assessment: 69 YOF who continues on Vancomycin s/p back surgery on 4/11. A Vancomycin random was drawn this morning to check levels in the setting of a bump in SCr and resulted as 22.1 mcg/ml. The dose was already reduced on 4/13 - will adjust the timing of the dose  today to allow the concentration to trend down to the 10-15 mcg/ml range.   Goal of Therapy:  Vancomycin trough level 10-15 mcg/ml  Plan:  1. Continue Vancomycin to 1250 mg IV every 24 hours starting at 1700 today 2. Will continue to follow renal function, culture results, LOT, and antibiotic de-escalation plans   Alycia Rossetti, PharmD, BCPS Clinical Pharmacist Pager: [phone] 10/07/2014 9:35 AM

## 2014-10-07 NOTE — Progress Notes (Signed)
Report stat BMP results to MD. Pt to be discharged to SNF this afternoon. Family at bedside. Pt denies pain or discomfort. No noted distress. Verbalizes needs. Assists with turning and repositioning. Surgical dressing site dry and intact. Call bell within reach. Will continue to monitor.

## 2014-10-07 NOTE — Progress Notes (Signed)
Occupational Therapy Treatment Patient Details Name: [name] MRN: [id number] DOB: 29-Jul-1945 Today's Date: 10/07/2014    History of present illness Pt s/p L4-L5,  L5-S1 PILF.  PMH: parkinson's, DM, HTN, diastolic heart failure, peripheral edema/neuropathy.    OT comments  Pt progressing somewhat towards acute OT goals. Limited by lethargy this session. Pt completed ADLs as detailed below. D/c plan remains appropriate.  Follow Up Recommendations  SNF    Equipment Recommendations  Other (comment)    Recommendations for Other Services      Precautions / Restrictions Precautions Precautions: Back Precaution Booklet Issued: Yes (comment) Precaution Comments: Pt lethargic and with altered mental status due to medications; unable to recall precautions this morning Required Braces or Orthoses: Spinal Brace Spinal Brace: Lumbar corset;Applied in sitting position Restrictions Weight Bearing Restrictions: No       Mobility Bed Mobility Overal bed mobility: +2 for physical assistance Bed Mobility: Rolling;Sit to Sidelying Rolling: Max assist Sidelying to sit: Mod assist     Sit to sidelying: Mod assist;+2 for physical assistance General bed mobility comments: see ADL comment  Transfers Overall transfer level: Needs assistance Equipment used: Rolling walker (2 wheeled) Transfers: Sit to/from [facility] Sit to Stand: Max assist;+2 physical assistance Stand pivot transfers: Mod assist;+2 physical assistance       General transfer comment: max A to power up to standing from recliner. Cued to place hands on recliner armrests to assist with powerup. Responded well to assist provided at left ischium.     Balance Overall balance assessment: Needs assistance         Standing balance support: Bilateral upper extremity supported;During functional activity Standing balance-Leahy Scale: Poor Standing balance comment: needs assist to maintain upright  position                   ADL                                         General ADL Comments: Pt completed stand pivot from recliner to EOB with max +2 assist. Mod +2 physical assist for sit>sidelying with cues for technique. Max A to roll. Pt maintained sidelying position while nurse remove drain tube. Total +2 assist to scoot up in bed with HOB in trendelenburg position. Limited by lethargy this session with pt needing mod cues to open eyes briefly to participate.      Vision                     Perception     Praxis      Cognition   Behavior During Therapy: Flat affect Overall Cognitive Status: Difficult to assess due to level of arousal :             Extremity/Trunk Assessment                  Shoulder Instructions       General Comments      Pertinent Vitals/ Pain       Pain Assessment: Faces Faces Pain Scale: Hurts even more Pain Location: Leg and foot still numb, pt unable to communicate other pain Pain Intervention(s): Monitored during session  Home Living  Prior Functioning/Environment              Frequency Min 2X/week     Progress Toward Goals  OT Goals(current goals can now be found in the care plan section)  Progress towards OT goals: Progressing toward goals  Acute Rehab OT Goals Patient Stated Goal: Return home with sister OT Goal Formulation: With patient/family Time For Goal Achievement: 10/12/14 Potential to Achieve Goals: Good ADL Goals Pt Will Perform Grooming: with modified independence;sitting;with adaptive equipment Pt Will Perform Lower Body Bathing: with min guard assist;with adaptive equipment;sit to/from stand Pt Will Perform Lower Body Dressing: with min guard assist;with adaptive equipment;sit to/from stand Pt Will Transfer to Toilet: with min guard assist;ambulating;bedside commode Pt Will Perform Toileting - Clothing  Manipulation and hygiene: with min guard assist;with adaptive equipment;sit to/from stand Additional ADL Goal #1: Pt will complete bed mobility at min guard level to prepare for OOB ADLs.  Plan Discharge plan remains appropriate    Co-evaluation                 End of Session Equipment Utilized During Treatment: Gait belt;Rolling walker;Back brace   Activity Tolerance Patient limited by lethargy   Patient Left in bed;with call bell/phone within reach;with bed alarm set;with nursing/sitter in room;with family/visitor present;with SCD's reapplied   Nurse Communication Other (comment) (Nurse present for session. )        Time: 5573-2202 OT Time Calculation (min): 25 min  Charges: OT General Charges $OT Visit: 1 Procedure OT Treatments $Self Care/Home Management : 23-37 mins  Hortencia Pilar 10/07/2014, 12:06 PM

## 2014-10-08 ENCOUNTER — Other Ambulatory Visit: Payer: Commercial Managed Care - HMO

## 2014-10-13 ENCOUNTER — Ambulatory Visit: Payer: Commercial Managed Care - HMO | Admitting: Endocrinology

## 2014-10-14 ENCOUNTER — Ambulatory Visit: Payer: Commercial Managed Care - HMO | Admitting: Cardiology

## 2014-10-14 DIAGNOSIS — M4806 Spinal stenosis, lumbar region: Secondary | ICD-10-CM | POA: Diagnosis not present

## 2014-10-14 DIAGNOSIS — G20 Parkinson's disease: Secondary | ICD-10-CM | POA: Diagnosis not present

## 2014-10-14 DIAGNOSIS — F319 Bipolar disorder, unspecified: Secondary | ICD-10-CM | POA: Diagnosis not present

## 2014-10-14 DIAGNOSIS — E1140 Type 2 diabetes mellitus with diabetic neuropathy, unspecified: Secondary | ICD-10-CM | POA: Diagnosis not present

## 2014-10-14 DIAGNOSIS — F324 Major depressive disorder, single episode, in partial remission: Secondary | ICD-10-CM | POA: Diagnosis not present

## 2014-10-14 DIAGNOSIS — Z9889 Other specified postprocedural states: Secondary | ICD-10-CM | POA: Diagnosis not present

## 2014-10-14 DIAGNOSIS — I10 Essential (primary) hypertension: Secondary | ICD-10-CM | POA: Diagnosis not present

## 2014-11-09 DIAGNOSIS — M5416 Radiculopathy, lumbar region: Secondary | ICD-10-CM | POA: Diagnosis not present

## 2014-11-09 DIAGNOSIS — Z6841 Body Mass Index (BMI) 40.0 and over, adult: Secondary | ICD-10-CM | POA: Diagnosis not present

## 2014-11-09 DIAGNOSIS — M4806 Spinal stenosis, lumbar region: Secondary | ICD-10-CM | POA: Diagnosis not present

## 2014-11-09 DIAGNOSIS — M5136 Other intervertebral disc degeneration, lumbar region: Secondary | ICD-10-CM | POA: Diagnosis not present

## 2014-11-09 DIAGNOSIS — M5137 Other intervertebral disc degeneration, lumbosacral region: Secondary | ICD-10-CM | POA: Diagnosis not present

## 2014-11-10 DIAGNOSIS — R202 Paresthesia of skin: Secondary | ICD-10-CM | POA: Diagnosis not present

## 2014-11-10 DIAGNOSIS — I10 Essential (primary) hypertension: Secondary | ICD-10-CM | POA: Diagnosis not present

## 2014-11-10 DIAGNOSIS — E782 Mixed hyperlipidemia: Secondary | ICD-10-CM | POA: Diagnosis not present

## 2014-11-10 DIAGNOSIS — E1165 Type 2 diabetes mellitus with hyperglycemia: Secondary | ICD-10-CM | POA: Diagnosis not present

## 2014-11-23 ENCOUNTER — Other Ambulatory Visit: Payer: Self-pay | Admitting: Endocrinology

## 2014-12-27 ENCOUNTER — Other Ambulatory Visit: Payer: Self-pay | Admitting: Endocrinology

## 2014-12-28 DIAGNOSIS — M5137 Other intervertebral disc degeneration, lumbosacral region: Secondary | ICD-10-CM | POA: Diagnosis not present

## 2014-12-28 DIAGNOSIS — I10 Essential (primary) hypertension: Secondary | ICD-10-CM | POA: Diagnosis not present

## 2014-12-28 DIAGNOSIS — Z6841 Body Mass Index (BMI) 40.0 and over, adult: Secondary | ICD-10-CM | POA: Diagnosis not present

## 2014-12-29 DIAGNOSIS — E1140 Type 2 diabetes mellitus with diabetic neuropathy, unspecified: Secondary | ICD-10-CM | POA: Diagnosis not present

## 2014-12-29 DIAGNOSIS — I10 Essential (primary) hypertension: Secondary | ICD-10-CM | POA: Diagnosis not present

## 2014-12-29 DIAGNOSIS — G20 Parkinson's disease: Secondary | ICD-10-CM | POA: Diagnosis not present

## 2014-12-29 DIAGNOSIS — R202 Paresthesia of skin: Secondary | ICD-10-CM | POA: Diagnosis not present

## 2015-01-05 ENCOUNTER — Other Ambulatory Visit: Payer: Self-pay | Admitting: *Deleted

## 2015-01-05 ENCOUNTER — Telehealth: Payer: Self-pay | Admitting: Endocrinology

## 2015-01-05 DIAGNOSIS — M5137 Other intervertebral disc degeneration, lumbosacral region: Secondary | ICD-10-CM | POA: Diagnosis not present

## 2015-01-05 DIAGNOSIS — M6281 Muscle weakness (generalized): Secondary | ICD-10-CM | POA: Diagnosis not present

## 2015-01-05 DIAGNOSIS — R262 Difficulty in walking, not elsewhere classified: Secondary | ICD-10-CM | POA: Diagnosis not present

## 2015-01-05 DIAGNOSIS — M545 Low back pain: Secondary | ICD-10-CM | POA: Diagnosis not present

## 2015-01-05 MED ORDER — INSULIN GLARGINE 100 UNIT/ML SOLOSTAR PEN: PEN_INJECTOR | SUBCUTANEOUS | Status: DC

## 2015-01-05 NOTE — Telephone Encounter (Signed)
Pt needs refill of lantis, pt does have apt schedule, please call pt [phone]

## 2015-01-05 NOTE — Telephone Encounter (Signed)
rx sent

## 2015-01-12 DIAGNOSIS — M545 Low back pain: Secondary | ICD-10-CM | POA: Diagnosis not present

## 2015-01-12 DIAGNOSIS — M5137 Other intervertebral disc degeneration, lumbosacral region: Secondary | ICD-10-CM | POA: Diagnosis not present

## 2015-01-12 DIAGNOSIS — R262 Difficulty in walking, not elsewhere classified: Secondary | ICD-10-CM | POA: Diagnosis not present

## 2015-01-12 DIAGNOSIS — M6281 Muscle weakness (generalized): Secondary | ICD-10-CM | POA: Diagnosis not present

## 2015-01-14 DIAGNOSIS — M5137 Other intervertebral disc degeneration, lumbosacral region: Secondary | ICD-10-CM | POA: Diagnosis not present

## 2015-01-14 DIAGNOSIS — M6281 Muscle weakness (generalized): Secondary | ICD-10-CM | POA: Diagnosis not present

## 2015-01-14 DIAGNOSIS — R262 Difficulty in walking, not elsewhere classified: Secondary | ICD-10-CM | POA: Diagnosis not present

## 2015-01-14 DIAGNOSIS — M545 Low back pain: Secondary | ICD-10-CM | POA: Diagnosis not present

## 2015-01-17 DIAGNOSIS — Z7982 Long term (current) use of aspirin: Secondary | ICD-10-CM | POA: Diagnosis not present

## 2015-01-17 DIAGNOSIS — M25572 Pain in left ankle and joints of left foot: Secondary | ICD-10-CM | POA: Diagnosis not present

## 2015-01-24 DIAGNOSIS — M545 Low back pain: Secondary | ICD-10-CM | POA: Diagnosis not present

## 2015-01-24 DIAGNOSIS — M6281 Muscle weakness (generalized): Secondary | ICD-10-CM | POA: Diagnosis not present

## 2015-01-24 DIAGNOSIS — R262 Difficulty in walking, not elsewhere classified: Secondary | ICD-10-CM | POA: Diagnosis not present

## 2015-01-24 DIAGNOSIS — M5137 Other intervertebral disc degeneration, lumbosacral region: Secondary | ICD-10-CM | POA: Diagnosis not present

## 2015-01-25 ENCOUNTER — Other Ambulatory Visit: Payer: Self-pay | Admitting: Endocrinology

## 2015-01-27 ENCOUNTER — Other Ambulatory Visit: Payer: Commercial Managed Care - HMO

## 2015-01-27 ENCOUNTER — Other Ambulatory Visit (INDEPENDENT_AMBULATORY_CARE_PROVIDER_SITE_OTHER): Payer: Commercial Managed Care - HMO

## 2015-01-27 DIAGNOSIS — E1165 Type 2 diabetes mellitus with hyperglycemia: Secondary | ICD-10-CM

## 2015-01-27 DIAGNOSIS — M545 Low back pain: Secondary | ICD-10-CM | POA: Diagnosis not present

## 2015-01-27 DIAGNOSIS — M5137 Other intervertebral disc degeneration, lumbosacral region: Secondary | ICD-10-CM | POA: Diagnosis not present

## 2015-01-27 DIAGNOSIS — R262 Difficulty in walking, not elsewhere classified: Secondary | ICD-10-CM | POA: Diagnosis not present

## 2015-01-27 DIAGNOSIS — IMO0002 Reserved for concepts with insufficient information to code with codable children: Secondary | ICD-10-CM

## 2015-01-27 DIAGNOSIS — M6281 Muscle weakness (generalized): Secondary | ICD-10-CM | POA: Diagnosis not present

## 2015-01-27 LAB — BASIC METABOLIC PANEL
BUN: 10 mg/dL (ref 6–23)
CO2: 29 mEq/L (ref 19–32)
Calcium: 8.9 mg/dL (ref 8.4–10.5)
Chloride: 97 mEq/L (ref 96–112)
Creatinine, Ser: 0.81 mg/dL (ref 0.40–1.20)
GFR: 90.75 mL/min (ref >60.00)
Glucose, Bld: 161 mg/dL — ABNORMAL HIGH (ref 70–99)
Potassium: 3.6 mEq/L (ref 3.5–5.1)
Sodium: 134 mEq/L — ABNORMAL LOW (ref 135–145)

## 2015-01-27 LAB — LDL CHOLESTEROL, DIRECT: Direct LDL: 66 mg/dL

## 2015-01-27 LAB — HEMOGLOBIN A1C: Hgb A1c MFr Bld: 8 % — ABNORMAL HIGH (ref 4.6–6.5)

## 2015-01-31 ENCOUNTER — Ambulatory Visit (INDEPENDENT_AMBULATORY_CARE_PROVIDER_SITE_OTHER): Payer: Commercial Managed Care - HMO | Admitting: Endocrinology

## 2015-01-31 ENCOUNTER — Other Ambulatory Visit: Payer: Self-pay | Admitting: *Deleted

## 2015-01-31 ENCOUNTER — Encounter: Payer: Self-pay | Admitting: Endocrinology

## 2015-01-31 VITALS — BP 134/84 | HR 70 | Temp 98.5°F | Resp 16 | Ht 61.0 in | Wt 268.4 lb

## 2015-01-31 DIAGNOSIS — M5137 Other intervertebral disc degeneration, lumbosacral region: Secondary | ICD-10-CM | POA: Diagnosis not present

## 2015-01-31 DIAGNOSIS — R262 Difficulty in walking, not elsewhere classified: Secondary | ICD-10-CM | POA: Diagnosis not present

## 2015-01-31 DIAGNOSIS — I10 Essential (primary) hypertension: Secondary | ICD-10-CM

## 2015-01-31 DIAGNOSIS — E669 Obesity, unspecified: Secondary | ICD-10-CM | POA: Diagnosis not present

## 2015-01-31 DIAGNOSIS — E1165 Type 2 diabetes mellitus with hyperglycemia: Secondary | ICD-10-CM | POA: Diagnosis not present

## 2015-01-31 DIAGNOSIS — E785 Hyperlipidemia, unspecified: Secondary | ICD-10-CM | POA: Diagnosis not present

## 2015-01-31 DIAGNOSIS — M6281 Muscle weakness (generalized): Secondary | ICD-10-CM | POA: Diagnosis not present

## 2015-01-31 DIAGNOSIS — IMO0002 Reserved for concepts with insufficient information to code with codable children: Secondary | ICD-10-CM

## 2015-01-31 DIAGNOSIS — M545 Low back pain: Secondary | ICD-10-CM | POA: Diagnosis not present

## 2015-01-31 MED ORDER — INSULIN GLARGINE 300 UNIT/ML ~~LOC~~ SOPN: 40.0000 [IU] | PEN_INJECTOR | Freq: Every day | SUBCUTANEOUS | Status: DC

## 2015-01-31 NOTE — Progress Notes (Signed)
Patient ID: [name], female   DOB: [birth year], 69 y.o.   MRN: [id number]   Reason for Appointment: Diabetes follow-up   History of Present Illness   Diagnosis: Type 2 DIABETES MELITUS, date of diagnosis: 2008       PAST history: She has been on insulin for a few years Her blood sugars had initially responded very well to adding Victoza to her basal insulin in 2/13. She had lost weight and was able to comply with diet consistently. A1c had gone down to 7.2%. Also was needing less Levemir insulin .  However blood sugars will be higher if she goes off her diet  With good compliance her A1c was near normal at 6.3 in 5/14  RECENT HISTORY:    Insulin regimen: Lantus 34 units hs; Humalog 6--6-10 before meals       She has not been seen in follow-up since 1/16 partly because of having back surgery More recently her blood sugars have been overall worse and A1c up to 8%, previously as low as 7.1 This is despite her losing weight since her last visit.  Current blood sugar patterns and problems identified:  She has had variable blood sugars over the last month and overall modestly increased at average of 161; previously on her download her blood sugars were only averaging 107  If she thinks that some of her high readings especially in the afternoons are from regular soft drinks; did have relatively better readings about 2 weeks ago  Checking blood sugars mostly in the mornings and some in the afternoons but generally not after evening meal  She cannot explain why her FASTING blood sugars are very inconsistent and may be related to her evening snack such as potato chips  Her blood sugars on a more variable before supper and not an average still relatively high  No hypoglycemia currently with taking some relatively small doses of mealtime coverage  Her occasional readings at bedtime are not high  She thinks her compliance with Lantus is good in the evenings She has been  consistent with Victoza 1.8, now taking this in the evening along with Lantus  Hypoglycemia: None since 12/27 She has been advised on the diet previously. She was seen by nurse educator in early 2015 and advised to have balanced meals with more protein as well avoid all regular soft drinks  Oral hypoglycemic drugs: None  Side effects from medications:  diarrhea from metformin            Proper timing of medications in relation to meals: Yes.    Monitors blood glucose:  once a day.    Glucometer:  Accu-Chek  Mean values apply above for all meters except median for One Touch  PRE-MEAL Fasting Lunch Dinner Bedtime Overall  Glucose range: 123-211  120-150   126-289   106-208    Mean/median:  154      161     Meals: 3 meals per day. Supper 7 pm, Lunch is sandwich at 1-2pm Physical activity: Y:Bike and machines, 60 min; 3-4//7            Dietician visit: Most recent: 6/13.   Wt Readings from Last 3 Encounters:  01/31/15 268 lb 6.4 oz (121.745 kg)  10/04/14 276 lb 3.2 oz (125.283 kg)  09/27/14 276 lb 3.2 oz (125.283 kg)   Lab Results  Component Value Date   HGBA1C 8.0* 01/27/2015   HGBA1C 7.3* 07/05/2014   HGBA1C 7.1* 06/10/2014  Lab Results  Component Value Date   MICROALBUR 3.0* 07/05/2014   LDLCALC 115* 07/05/2014   CREATININE 0.81 01/27/2015       Medication List       This list is accurate as of: 01/31/15  8:52 PM.  Always use your most recent med list.               ACCU-CHEK FASTCLIX LANCETS Misc  Use to check blood sugar 3 times per day dx code E11.65     ACCU-CHEK NANO SMARTVIEW W/DEVICE Kit  Use to check blood sugar 3 times per day dx code E11.65     ACCU-CHEK SMARTVIEW CONTROL Liqd  Use as directed     ARIPiprazole 10 MG tablet  Commonly known as:  ABILIFY  Take 10 mg by mouth daily.     aspirin EC 81 MG tablet  Take 1 tablet (81 mg total) by mouth at bedtime.     atorvastatin 40 MG tablet  Commonly known as:  LIPITOR  TAKE ONE TABLET BY MOUTH  IN THE EVENING     B-D SINGLE USE SWABS REGULAR Pads  Use as directed     carbidopa-levodopa 25-100 MG per tablet  Commonly known as:  SINEMET IR  Take 1 tablet by mouth 3 (three) times daily.     cholestyramine 4 G packet  Commonly known as:  QUESTRAN  Take 4 g by mouth 2 (two) times daily as needed.     dextromethorphan 30 MG/5ML liquid  Commonly known as:  DELSYM  Take 15 mg by mouth as needed for cough.     doxycycline 100 MG capsule  Commonly known as:  VIBRAMYCIN  Take 100 mg by mouth 2 (two) times daily. Take for 10 Days     furosemide 40 MG tablet  Commonly known as:  LASIX  Take 40 mg by mouth every evening.     glucose blood test strip  Commonly known as:  ACCU-CHEK SMARTVIEW  accu-chek smart view test strips, use to check blood sugars 3 times per day DAW, dx code E11.65     HUMALOG KWIKPEN 100 UNIT/ML KiwkPen  Generic drug:  insulin lispro  INJECT 8 UNITS SUBCUTANEOUSLY WITH BREAKFAST AND AT LUNCH THEN 12 UNITS WITH SUPPER     HYDROcodone-acetaminophen 5-325 MG per tablet  Commonly known as:  NORCO/VICODIN  Take 1 tablet by mouth every 4 (four) hours as needed for moderate pain.     Insulin Glargine 300 UNIT/ML Sopn  Commonly known as:  TOUJEO SOLOSTAR  Inject 40 Units into the skin daily.     isosorbide mononitrate 30 MG 24 hr tablet  Commonly known as:  IMDUR  Take 30 mg by mouth daily.     lamoTRIgine 150 MG tablet  Commonly known as:  LAMICTAL  Take 150 mg by mouth daily.     Liraglutide 18 MG/3ML Sopn  Commonly known as:  VICTOZA  Inject 1.8 daily     meloxicam 15 MG tablet  Commonly known as:  MOBIC  Take 7.5 mg by mouth daily.     nebivolol 10 MG tablet  Commonly known as:  BYSTOLIC  Take 10 mg by mouth daily.     olmesartan 40 MG tablet  Commonly known as:  BENICAR  Take 40 mg by mouth daily.     omega-3 acid ethyl esters 1 G capsule  Commonly known as:  LOVAZA  Take 1 g by mouth daily.     Pancrelipase (Lip-Prot-Amyl) 25000  UNITS  Cpep  Take 5 capsules by mouth 3 (three) times daily. 2 caps at breakfast, 1 at lunch, 2 at dinner     potassium chloride SA 20 MEQ tablet  Commonly known as:  K-DUR,KLOR-CON  Take 20 mEq by mouth daily.     primidone 50 MG tablet  Commonly known as:  MYSOLINE  Take 50 mg by mouth at bedtime.     sertraline 100 MG tablet  Commonly known as:  ZOLOFT  Take 50 mg by mouth daily.        Allergies:  Allergies  Allergen Reactions  . Codeine     Headache and "just feel off"  . Penicillins Hives  . Sulfa Antibiotics Hives  . Other Rash    Bleach    Past Medical History  Diagnosis Date  . Diastolic heart failure 1194    Grade 1 diastolic Dysfunction by Echo   . Seizure disorder   . Hyperlipidemia     takes Lipitor daily  . Obstructive sleep apnea   . Empty sella     on MRI in 2009.  Marland Kitchen Abnormal liver function      in the past.  . Pancreatitis     takes Pancrelipase daily  . Anemia   . HTN (hypertension)     takes [employer] daily  . DM (diabetes mellitus)     takes Victoza daily as well as Lantus and Humalog  . Parkinson's disease     takes Sinemet daily  . Demand myocardial infarction 2012    Demand Infarction in setting of Pancreatitis --> mild Troponin elevation, Non-obstructive CAD  . Peripheral edema     takes Lasix daily  . History of influenza     antibiotic given yesterday along with taking Delsym cough syrup  . Pneumonia 2012  . Headache(784.0)     last migraine-4-13yr ago  . Seizures     takes Lamictal daily and Primidone nightly;last seizure 2wks ago  . Peripheral neuropathy   . Arthritis     all over  . Joint swelling   . Chronic back pain     stenosis  . Bruises easily   . History of colon polyps     benign  . Diverticulosis   . Urinary frequency   . Urinary urgency   . Nocturia   . History of blood transfusion     no abnormal reaction noted  . Cataract     right eye;immature  . Glaucoma   . Depression     takes  Abilify daily as well as Zoloft  . Chronic cough     Past Surgical History  Procedure Laterality Date  . Cholecystectomy    . Abdominal hysterectomy    . Tubal ligation    . Tonsillectomy    . Appendectomy    . Rectal polypectomy    . Eye cysts Bilateral   . Back surgery    . Colonoscopy N/A 08/15/2012    Procedure: COLONOSCOPY;  Surgeon: WArta Silence MD;  Location: [facility];  Service: Endoscopy;  Laterality: N/A;  . Vaginal cyst removed      several times  . Esophagogastroduodenoscopy    . Lasik    . Nm myoview ltd  March 2012    Probably normal [employer] Mild reversible defect in anterior wall appears to be breast attenuation but cannot exclude ischemia:  FALSE POSITIVE  . Cardiac catheterization  2009/March 2012    2009: (Dr. KAlvie Heidelberg Nonobstructive CAD; 2012: Minimal CAD --> false  positive stress test  . Transthoracic echocardiogram  November 2010    Mild concentric LVH. EF 55-60%. No RWMA, Abnormal Relaxation (Gr 1 DD). Mildly thickened AoV leaflets w/o stenosis    Family History  Problem Relation Age of Onset  . Allergies Father   . Heart disease Father     before 26  . Heart failure Father   . Hypertension Father   . Hyperlipidemia Father   . Heart disease Mother   . Diabetes Mother   . Hypertension Mother   . Hyperlipidemia Mother   . Hypertension Sister   . Heart disease Sister     before 27  . Hyperlipidemia Sister   . Cancer Other   . Diabetes Brother   . Hypertension Brother   . Diabetes Sister   . Hypertension Sister   . Diabetes Son   . Hypertension Son     Social History:  reports that she has quit smoking. Her smoking use included Cigarettes. She has a 12.5 pack-year smoking history. She has never used smokeless tobacco. She reports that she does not drink alcohol or use illicit drugs.  ROS:  She has had regular eye exams  Hypertension: currently on Bystolic, Benicar and Lasix.  Does not monitor at home  Hypercholesterolemia:  On Lipitor, has better results now  Lab Results  Component Value Date   CHOL 190 07/05/2014   HDL 56.80 07/05/2014   LDLCALC 115* 07/05/2014   LDLDIRECT 66.0 01/27/2015   TRIG 93.0 07/05/2014   CHOLHDL 3 07/05/2014   Foot exam done in 5/15  Edema treated with Lasix 40 mg  Taking Meloxicam  for pain in her feet    Examination:   BP 134/84 mmHg  Pulse 70  Temp(Src) 98.5 F (36.9 C)  Resp 16  Ht 5' 1"  (1.549 m)  Wt 268 lb 6.4 oz (121.745 kg)  BMI 50.74 kg/m2  SpO2 90%  Body mass index is 50.74 kg/(m^2).   No ankle edema  Assesment:   Diabetes type 2, uncontrolled    Her blood sugars have been overall worse with A1c 8% See history of present illness for detailed discussion of current blood sugar patterns, problems identified and current management Although her not previous visit she was having excellent blood sugars including some low sugars she is having mostly higher readings now and inconsistent.   Even though she is trying to exercise fairly regularly her compliance with diet has been quite variable  She also has been using regular soft drinks which may be playing a role in sporadic high readings in the afternoons  Also does not check readings after evening meals much, takes the highest amount of mealtime insulin at that time; may have some overnight hypoglycemia related to bedtime snacks  Not clear if some of the variability may be related to using Lantus  Also taking 1.8 Victoza Currently is however taking relatively small amounts of mealtime insulin   HYPERTENSION: Her blood pressure is being managed by PCP and relatively better today  LIPIDS: LDL is  better controlled now   PLAN:   She will try check blood sugars more consistently after evening meals as discussed, discussed blood sugar targets.    May need to adjust her Humalog based on meal size  Avoid regular soft drinks and high fat snacks especially at night  Will switch her to Toujeo 34 units  instead of Lantus and discussed that she may need to to 4 units more compared to her Lantus dose now  She will call if she has consistently high readings  Consider follow-up with dietitian  Continue exercise regimen  Try to take only meloxicam as needed   Patient Instructions  Check blood sugars on waking up .Marland Kitchen3-4  .Marland Kitchen times a week Also check blood sugars about 2 hours after a meal and do this after different meals by rotation  Recommended blood sugar levels on waking up is 90-130 and about 2 hours after meal is 140-180 Please bring blood sugar monitor to each visit.  Switch to 34 units of Toujeo or Lantus  No regular soft drinks    Counseling time over 50% of today's 25 minute visit  Chaysen Tillman 01/31/2015, 8:52 PM   Labs:  Appointment on 01/27/2015  Component Date Value Ref Range Status  . Hgb A1c MFr Bld 01/27/2015 8.0* 4.6 - 6.5 % Final   Glycemic Control Guidelines for People with Diabetes:Non Diabetic:  <6%Goal of Therapy: <7%Additional Action Suggested:  >8%   . Sodium 01/27/2015 134* 135 - 145 mEq/L Final  . Potassium 01/27/2015 3.6  3.5 - 5.1 mEq/L Final  . Chloride 01/27/2015 97  96 - 112 mEq/L Final  . CO2 01/27/2015 29  19 - 32 mEq/L Final  . Glucose, Bld 01/27/2015 161* 70 - 99 mg/dL Final  . BUN 01/27/2015 10  6 - 23 mg/dL Final  . Creatinine, Ser 01/27/2015 0.81  0.40 - 1.20 mg/dL Final  . Calcium 01/27/2015 8.9  8.4 - 10.5 mg/dL Final  . GFR 01/27/2015 90.75  >60.00 mL/min Final  . Direct LDL 01/27/2015 66.0   Final   Optimal:  <100 mg/dLNear or Above Optimal:  100-129 mg/dLBorderline High:  130-159 mg/dLHigh:  160-189 mg/dLVery High:  >190 mg/dL

## 2015-01-31 NOTE — Patient Instructions (Signed)
Check blood sugars on waking up .Marland Kitchen3-4  .Marland Kitchen times a week Also check blood sugars about 2 hours after a meal and do this after different meals by rotation  Recommended blood sugar levels on waking up is 90-130 and about 2 hours after meal is 140-180 Please bring blood sugar monitor to each visit.  Switch to 34 units of Toujeo or Lantus  No regular soft drinks

## 2015-02-09 DIAGNOSIS — M545 Low back pain: Secondary | ICD-10-CM | POA: Diagnosis not present

## 2015-02-09 DIAGNOSIS — M6281 Muscle weakness (generalized): Secondary | ICD-10-CM | POA: Diagnosis not present

## 2015-02-09 DIAGNOSIS — M5137 Other intervertebral disc degeneration, lumbosacral region: Secondary | ICD-10-CM | POA: Diagnosis not present

## 2015-02-09 DIAGNOSIS — R262 Difficulty in walking, not elsewhere classified: Secondary | ICD-10-CM | POA: Diagnosis not present

## 2015-02-11 DIAGNOSIS — R262 Difficulty in walking, not elsewhere classified: Secondary | ICD-10-CM | POA: Diagnosis not present

## 2015-02-11 DIAGNOSIS — M545 Low back pain: Secondary | ICD-10-CM | POA: Diagnosis not present

## 2015-02-11 DIAGNOSIS — M6281 Muscle weakness (generalized): Secondary | ICD-10-CM | POA: Diagnosis not present

## 2015-02-11 DIAGNOSIS — M5137 Other intervertebral disc degeneration, lumbosacral region: Secondary | ICD-10-CM | POA: Diagnosis not present

## 2015-02-15 DIAGNOSIS — M5137 Other intervertebral disc degeneration, lumbosacral region: Secondary | ICD-10-CM | POA: Diagnosis not present

## 2015-02-15 DIAGNOSIS — R262 Difficulty in walking, not elsewhere classified: Secondary | ICD-10-CM | POA: Diagnosis not present

## 2015-02-15 DIAGNOSIS — M545 Low back pain: Secondary | ICD-10-CM | POA: Diagnosis not present

## 2015-02-15 DIAGNOSIS — M6281 Muscle weakness (generalized): Secondary | ICD-10-CM | POA: Diagnosis not present

## 2015-02-17 ENCOUNTER — Ambulatory Visit: Payer: Commercial Managed Care - HMO

## 2015-02-22 DIAGNOSIS — M6281 Muscle weakness (generalized): Secondary | ICD-10-CM | POA: Diagnosis not present

## 2015-02-22 DIAGNOSIS — M545 Low back pain: Secondary | ICD-10-CM | POA: Diagnosis not present

## 2015-02-22 DIAGNOSIS — R262 Difficulty in walking, not elsewhere classified: Secondary | ICD-10-CM | POA: Diagnosis not present

## 2015-02-22 DIAGNOSIS — M5137 Other intervertebral disc degeneration, lumbosacral region: Secondary | ICD-10-CM | POA: Diagnosis not present

## 2015-02-24 DIAGNOSIS — M5137 Other intervertebral disc degeneration, lumbosacral region: Secondary | ICD-10-CM | POA: Diagnosis not present

## 2015-02-24 DIAGNOSIS — R262 Difficulty in walking, not elsewhere classified: Secondary | ICD-10-CM | POA: Diagnosis not present

## 2015-02-24 DIAGNOSIS — M545 Low back pain: Secondary | ICD-10-CM | POA: Diagnosis not present

## 2015-02-24 DIAGNOSIS — M6281 Muscle weakness (generalized): Secondary | ICD-10-CM | POA: Diagnosis not present

## 2015-02-28 ENCOUNTER — Other Ambulatory Visit: Payer: Self-pay | Admitting: Endocrinology

## 2015-03-01 DIAGNOSIS — R262 Difficulty in walking, not elsewhere classified: Secondary | ICD-10-CM | POA: Diagnosis not present

## 2015-03-01 DIAGNOSIS — M6281 Muscle weakness (generalized): Secondary | ICD-10-CM | POA: Diagnosis not present

## 2015-03-01 DIAGNOSIS — M5137 Other intervertebral disc degeneration, lumbosacral region: Secondary | ICD-10-CM | POA: Diagnosis not present

## 2015-03-01 DIAGNOSIS — M545 Low back pain: Secondary | ICD-10-CM | POA: Diagnosis not present

## 2015-03-03 DIAGNOSIS — M5137 Other intervertebral disc degeneration, lumbosacral region: Secondary | ICD-10-CM | POA: Diagnosis not present

## 2015-03-03 DIAGNOSIS — M545 Low back pain: Secondary | ICD-10-CM | POA: Diagnosis not present

## 2015-03-03 DIAGNOSIS — M6281 Muscle weakness (generalized): Secondary | ICD-10-CM | POA: Diagnosis not present

## 2015-03-03 DIAGNOSIS — R262 Difficulty in walking, not elsewhere classified: Secondary | ICD-10-CM | POA: Diagnosis not present

## 2015-03-09 DIAGNOSIS — M5137 Other intervertebral disc degeneration, lumbosacral region: Secondary | ICD-10-CM | POA: Diagnosis not present

## 2015-03-09 DIAGNOSIS — M545 Low back pain: Secondary | ICD-10-CM | POA: Diagnosis not present

## 2015-03-09 DIAGNOSIS — R262 Difficulty in walking, not elsewhere classified: Secondary | ICD-10-CM | POA: Diagnosis not present

## 2015-03-09 DIAGNOSIS — M6281 Muscle weakness (generalized): Secondary | ICD-10-CM | POA: Diagnosis not present

## 2015-03-14 DIAGNOSIS — M545 Low back pain: Secondary | ICD-10-CM | POA: Diagnosis not present

## 2015-03-14 DIAGNOSIS — M6281 Muscle weakness (generalized): Secondary | ICD-10-CM | POA: Diagnosis not present

## 2015-03-14 DIAGNOSIS — M5137 Other intervertebral disc degeneration, lumbosacral region: Secondary | ICD-10-CM | POA: Diagnosis not present

## 2015-03-14 DIAGNOSIS — R262 Difficulty in walking, not elsewhere classified: Secondary | ICD-10-CM | POA: Diagnosis not present

## 2015-03-17 ENCOUNTER — Ambulatory Visit: Payer: Commercial Managed Care - HMO | Admitting: *Deleted

## 2015-03-18 DIAGNOSIS — R262 Difficulty in walking, not elsewhere classified: Secondary | ICD-10-CM | POA: Diagnosis not present

## 2015-03-18 DIAGNOSIS — M5137 Other intervertebral disc degeneration, lumbosacral region: Secondary | ICD-10-CM | POA: Diagnosis not present

## 2015-03-18 DIAGNOSIS — M6281 Muscle weakness (generalized): Secondary | ICD-10-CM | POA: Diagnosis not present

## 2015-03-18 DIAGNOSIS — M545 Low back pain: Secondary | ICD-10-CM | POA: Diagnosis not present

## 2015-03-21 DIAGNOSIS — I10 Essential (primary) hypertension: Secondary | ICD-10-CM | POA: Diagnosis not present

## 2015-03-21 DIAGNOSIS — Z79899 Other long term (current) drug therapy: Secondary | ICD-10-CM | POA: Diagnosis not present

## 2015-03-21 DIAGNOSIS — M2140 Flat foot [pes planus] (acquired), unspecified foot: Secondary | ICD-10-CM | POA: Diagnosis not present

## 2015-03-21 DIAGNOSIS — G20 Parkinson's disease: Secondary | ICD-10-CM | POA: Diagnosis not present

## 2015-03-21 DIAGNOSIS — E1140 Type 2 diabetes mellitus with diabetic neuropathy, unspecified: Secondary | ICD-10-CM | POA: Diagnosis not present

## 2015-03-21 DIAGNOSIS — G629 Polyneuropathy, unspecified: Secondary | ICD-10-CM | POA: Diagnosis not present

## 2015-03-22 DIAGNOSIS — R262 Difficulty in walking, not elsewhere classified: Secondary | ICD-10-CM | POA: Diagnosis not present

## 2015-03-22 DIAGNOSIS — M5137 Other intervertebral disc degeneration, lumbosacral region: Secondary | ICD-10-CM | POA: Diagnosis not present

## 2015-03-22 DIAGNOSIS — M6281 Muscle weakness (generalized): Secondary | ICD-10-CM | POA: Diagnosis not present

## 2015-03-22 DIAGNOSIS — M545 Low back pain: Secondary | ICD-10-CM | POA: Diagnosis not present

## 2015-03-31 DIAGNOSIS — M5137 Other intervertebral disc degeneration, lumbosacral region: Secondary | ICD-10-CM | POA: Diagnosis not present

## 2015-03-31 DIAGNOSIS — M5416 Radiculopathy, lumbar region: Secondary | ICD-10-CM | POA: Diagnosis not present

## 2015-04-04 ENCOUNTER — Other Ambulatory Visit: Payer: Commercial Managed Care - HMO

## 2015-04-06 ENCOUNTER — Other Ambulatory Visit: Payer: Self-pay | Admitting: Neurosurgery

## 2015-04-06 DIAGNOSIS — M5416 Radiculopathy, lumbar region: Secondary | ICD-10-CM

## 2015-04-07 ENCOUNTER — Ambulatory Visit: Payer: Commercial Managed Care - HMO | Admitting: Endocrinology

## 2015-04-11 ENCOUNTER — Ambulatory Visit
Admission: RE | Admit: 2015-04-11 | Discharge: 2015-04-11 | Disposition: A | Discharge status: Home / Self Care | Payer: Commercial Managed Care - HMO | Source: Ambulatory Visit | Attending: Neurosurgery | Admitting: Neurosurgery

## 2015-04-11 DIAGNOSIS — M5416 Radiculopathy, lumbar region: Secondary | ICD-10-CM

## 2015-04-11 DIAGNOSIS — R569 Unspecified convulsions: Secondary | ICD-10-CM | POA: Diagnosis not present

## 2015-04-11 DIAGNOSIS — I10 Essential (primary) hypertension: Secondary | ICD-10-CM | POA: Diagnosis not present

## 2015-04-11 DIAGNOSIS — E1165 Type 2 diabetes mellitus with hyperglycemia: Secondary | ICD-10-CM | POA: Diagnosis not present

## 2015-04-11 DIAGNOSIS — M79605 Pain in left leg: Secondary | ICD-10-CM | POA: Diagnosis not present

## 2015-04-11 DIAGNOSIS — Z794 Long term (current) use of insulin: Secondary | ICD-10-CM | POA: Diagnosis not present

## 2015-04-11 DIAGNOSIS — M545 Low back pain: Secondary | ICD-10-CM | POA: Diagnosis not present

## 2015-04-15 ENCOUNTER — Other Ambulatory Visit: Payer: Self-pay | Admitting: Endocrinology

## 2015-04-18 ENCOUNTER — Other Ambulatory Visit: Payer: Self-pay | Admitting: Endocrinology

## 2015-04-20 DIAGNOSIS — G20 Parkinson's disease: Secondary | ICD-10-CM | POA: Diagnosis not present

## 2015-04-28 DIAGNOSIS — M5137 Other intervertebral disc degeneration, lumbosacral region: Secondary | ICD-10-CM | POA: Diagnosis not present

## 2015-04-28 DIAGNOSIS — M5127 Other intervertebral disc displacement, lumbosacral region: Secondary | ICD-10-CM | POA: Diagnosis not present

## 2015-05-13 ENCOUNTER — Encounter: Payer: Commercial Managed Care - HMO | Attending: Endocrinology | Admitting: *Deleted

## 2015-05-13 ENCOUNTER — Encounter: Payer: Self-pay | Admitting: *Deleted

## 2015-05-13 VITALS — Ht 61.0 in | Wt 270.2 lb

## 2015-05-13 DIAGNOSIS — Z794 Long term (current) use of insulin: Secondary | ICD-10-CM | POA: Insufficient documentation

## 2015-05-13 DIAGNOSIS — E119 Type 2 diabetes mellitus without complications: Secondary | ICD-10-CM | POA: Diagnosis not present

## 2015-05-13 DIAGNOSIS — Z713 Dietary counseling and surveillance: Secondary | ICD-10-CM | POA: Insufficient documentation

## 2015-05-13 NOTE — Patient Instructions (Signed)
Plan:  Aim for 2 Carb Choices per meal (30 grams) +/- 1 either way  Aim for 0-1 Carbs per snack if hungry  Include protein in moderation with your meals and snacks Consider reading food labels for Total Carbohydrate of foods Consider checking BG at alternate times per day as directed by MD  Continue taking diabetes medication insulin as directed by MD

## 2015-05-14 NOTE — Progress Notes (Signed)
Diabetes Self-Management Education  Visit Type: First/Initial  Appt. Start Time: 0930 Appt. End Time: 1100  05/14/2015  [name], identified by name and date of birth, is a 69 y.o. female with a diagnosis of Diabetes: Type 2.   ASSESSMENT  Height 5\' 1"  (1.549 m), weight 270 lb 3.2 oz (122.562 kg). Body mass index is 51.08 kg/(m^2).      Diabetes Self-Management Education - 05/13/15 0948    Visit Information   Visit Type First/Initial   Initial Visit   Diabetes Type Type 2   Are you currently following a meal plan? No   Are you taking your medications as prescribed? Yes   Health Coping   How would you rate your overall health? Fair   Psychosocial Assessment   Patient Belief/Attitude about Diabetes Other (comment)  her grown son had DM 1 and they support each other   Self-care barriers None   Self-management support Family;Doctor's office   Other persons present Patient   Patient Concerns Weight Control   Special Needs None   Preferred Learning Style Auditory   Learning Readiness Contemplating   How often do you need to have someone help you when you read instructions, pamphlets, or other written materials from your doctor or pharmacy? 1 - Never   What is the last grade level you completed in school? some college   Complications   Last HgB A1C per patient/outside source 8 %   How often do you check your blood sugar? 1-2 times/day   Fasting Blood glucose range (mg/dL) 70-129   Number of hypoglycemic episodes per month 1   Can you tell when your blood sugar is low? Yes   What do you do if your blood sugar is low? regular soda,    Have you had a dilated eye exam in the past 12 months? No   Have you had a dental exam in the past 12 months? No   Are you checking your feet? Yes   How many days per week are you checking your feet? 7   Dietary Intake   Breakfast poached egg, applesauce, baked pork chop OR grilled cheese sandwich. She is avoiding cereal now    Snack  (morning) not unless low BG   Lunch may skip if she is out shopping, may have [employer] caf[employer] - vegetables, meat, occasionally a starch,    Snack (afternoon) not unless low BG, she likes chips occasionally   Dinner meat, starch, vegetables, occasionally with bread   Snack (evening) typically will have a snack; chips, cookies or fresh fruit    Beverage(s) water, coffee black, she avoids her sister's sweet tea and Kool-aid   Exercise   Exercise Type Light (walking / raking leaves)  stretching exercises   How many days per week to you exercise? 5   How many minutes per day do you exercise? 15   Total minutes per week of exercise 75   Patient Education   Previous Diabetes Education No   Disease state  Definition of diabetes, type 1 and 2, and the diagnosis of diabetes   Nutrition management  Role of diet in the treatment of diabetes and the relationship between the three main macronutrients and blood glucose level;Food label reading, portion sizes and measuring food.;Carbohydrate counting;Reviewed blood glucose goals for pre and post meals and how to evaluate the patients' food intake on their blood glucose level.   Monitoring Purpose and frequency of SMBG.;Identified appropriate SMBG and/or A1C goals.   Acute complications  Taught treatment of hypoglycemia - the 15 rule.   Chronic complications Relationship between chronic complications and blood glucose control   Psychosocial adjustment Role of stress on diabetes   Individualized Goals (developed by patient)   Nutrition Follow meal plan discussed   Medications take my medication as prescribed   Monitoring  test blood glucose pre and post meals as discussed   Outcomes   Expected Outcomes Demonstrated interest in learning. Expect positive outcomes   Future DMSE 4-6 wks   Program Status Not Completed      Individualized Plan for Diabetes Self-Management Training:   Learning Objective:  Patient will have a greater understanding of diabetes  self-management. Patient education plan is to attend individual and/or group sessions per assessed needs and concerns.   Plan:   Patient Instructions  Plan:  Aim for 2 Carb Choices per meal (30 grams) +/- 1 either way  Aim for 0-1 Carbs per snack if hungry  Include protein in moderation with your meals and snacks Consider reading food labels for Total Carbohydrate of foods Consider checking BG at alternate times per day as directed by MD  Continue taking diabetes medication insulin as directed by MD  We will discuss the action of your diabetes medications and importance of activity in your life at our next visit.   Expected Outcomes:  Demonstrated interest in learning. Expect positive outcomes  Education material provided: Living Well with Diabetes, A1C conversion sheet, Meal plan card and Carbohydrate counting sheet  If problems or questions, patient to contact team via:  Phone and Email  Future DSME appointment: 4-6 wks

## 2015-06-01 DIAGNOSIS — M6702 Short Achilles tendon (acquired), left ankle: Secondary | ICD-10-CM | POA: Diagnosis not present

## 2015-06-01 DIAGNOSIS — M19072 Primary osteoarthritis, left ankle and foot: Secondary | ICD-10-CM | POA: Diagnosis not present

## 2015-06-01 DIAGNOSIS — M19071 Primary osteoarthritis, right ankle and foot: Secondary | ICD-10-CM | POA: Diagnosis not present

## 2015-06-01 DIAGNOSIS — M6701 Short Achilles tendon (acquired), right ankle: Secondary | ICD-10-CM | POA: Diagnosis not present

## 2015-06-02 ENCOUNTER — Encounter: Payer: Self-pay | Admitting: *Deleted

## 2015-06-02 ENCOUNTER — Encounter: Payer: Commercial Managed Care - HMO | Attending: Endocrinology | Admitting: *Deleted

## 2015-06-02 VITALS — Ht 61.0 in | Wt 274.0 lb

## 2015-06-02 DIAGNOSIS — E119 Type 2 diabetes mellitus without complications: Secondary | ICD-10-CM | POA: Insufficient documentation

## 2015-06-02 DIAGNOSIS — Z794 Long term (current) use of insulin: Secondary | ICD-10-CM | POA: Diagnosis not present

## 2015-06-02 DIAGNOSIS — Z713 Dietary counseling and surveillance: Secondary | ICD-10-CM | POA: Insufficient documentation

## 2015-06-02 DIAGNOSIS — IMO0001 Reserved for inherently not codable concepts without codable children: Secondary | ICD-10-CM

## 2015-06-02 DIAGNOSIS — E1165 Type 2 diabetes mellitus with hyperglycemia: Secondary | ICD-10-CM

## 2015-06-02 NOTE — Patient Instructions (Signed)
Plan:  Aim for 2 Carb Choices per meal (30 grams) +/- 1 either way  Aim for 0-1 Carbs per snack if hungry  Include protein in moderation with your meals and snacks Continue reading food labels for Total Carbohydrate of foods Continue checking BG at alternate times per day   Continue taking diabetes medication insulin as directed by MD Consider increasing your activity level by going to the gym again, consider the pool and Arm Chair exercises on alternate days

## 2015-06-03 NOTE — Progress Notes (Signed)
Diabetes Self-Management Education  Visit Type:  Follow-up  Appt. Start Time: 1600 Appt. End Time: 1630  06/03/2015  [name], identified by name and date of birth, is a 69 y.o. female with a diagnosis of Diabetes: Type 2.   ASSESSMENT  Height 5\' 1"  (1.549 m), weight 274 lb (124.286 kg). Body mass index is 51.8 kg/(m^2).       Diabetes Self-Management Education - 06/02/15 1619    Exercise   Exercise Type --  treadmill for 1 mile, bike for a mile, leg resistance machine at [facility]      Learning Objective:  Patient will have a greater understanding of diabetes self-management. Patient education plan is to attend individual and/or group sessions per assessed needs and concerns.   Plan:   Patient Instructions  Plan:  Aim for 2 Carb Choices per meal (30 grams) +/- 1 either way  Aim for 0-1 Carbs per snack if hungry  Include protein in moderation with your meals and snacks Continue reading food labels for Total Carbohydrate of foods Continue checking BG at alternate times per day   Continue taking diabetes medication insulin as directed by MD Consider increasing your activity level by going to the gym again, consider the pool and Arm Chair exercises on alternate days    Expected Outcomes:  Demonstrated interest in learning. Expect positive outcomes  Education material provided: Arm Chair exercises, Diabetes Medication list  If problems or questions, patient to contact team via:  Phone and Email  Future DSME appointment: - PRN

## 2015-06-07 ENCOUNTER — Other Ambulatory Visit: Payer: Commercial Managed Care - HMO

## 2015-06-10 ENCOUNTER — Ambulatory Visit: Payer: Commercial Managed Care - HMO | Admitting: Endocrinology

## 2015-06-13 ENCOUNTER — Other Ambulatory Visit: Payer: Self-pay | Admitting: Endocrinology

## 2015-06-21 ENCOUNTER — Other Ambulatory Visit: Payer: Self-pay | Admitting: Endocrinology

## 2015-06-24 ENCOUNTER — Other Ambulatory Visit: Payer: Self-pay | Admitting: Endocrinology

## 2015-07-01 ENCOUNTER — Other Ambulatory Visit (INDEPENDENT_AMBULATORY_CARE_PROVIDER_SITE_OTHER): Payer: Commercial Managed Care - HMO

## 2015-07-01 DIAGNOSIS — E1165 Type 2 diabetes mellitus with hyperglycemia: Secondary | ICD-10-CM

## 2015-07-01 DIAGNOSIS — IMO0002 Reserved for concepts with insufficient information to code with codable children: Secondary | ICD-10-CM

## 2015-07-01 LAB — BASIC METABOLIC PANEL
BUN: 13 mg/dL (ref 6–23)
CO2: 31 mEq/L (ref 19–32)
Calcium: 9 mg/dL (ref 8.4–10.5)
Chloride: 99 mEq/L (ref 96–112)
Creatinine, Ser: 0.8 mg/dL (ref 0.40–1.20)
GFR: 91.95 mL/min (ref >60.00)
Glucose, Bld: 116 mg/dL — ABNORMAL HIGH (ref 70–99)
Potassium: 4 mEq/L (ref 3.5–5.1)
Sodium: 136 mEq/L (ref 135–145)

## 2015-07-01 LAB — MICROALBUMIN / CREATININE URINE RATIO
Creatinine,U: 23.9 mg/dL
Microalb Creat Ratio: 2.9 mg/g (ref 0.0–30.0)
Microalb, Ur: <0.7 mg/dL (ref 0.0–1.9)

## 2015-07-02 LAB — FRUCTOSAMINE: Fructosamine: 282 umol/L (ref 0–285)

## 2015-07-07 ENCOUNTER — Telehealth: Payer: Self-pay | Admitting: Endocrinology

## 2015-07-07 ENCOUNTER — Ambulatory Visit (INDEPENDENT_AMBULATORY_CARE_PROVIDER_SITE_OTHER): Payer: Commercial Managed Care - HMO | Admitting: Endocrinology

## 2015-07-07 ENCOUNTER — Encounter: Payer: Self-pay | Admitting: Endocrinology

## 2015-07-07 VITALS — BP 138/80 | HR 85 | Temp 97.8°F | Wt 275.6 lb

## 2015-07-07 DIAGNOSIS — E1165 Type 2 diabetes mellitus with hyperglycemia: Secondary | ICD-10-CM | POA: Diagnosis not present

## 2015-07-07 DIAGNOSIS — Z794 Long term (current) use of insulin: Secondary | ICD-10-CM

## 2015-07-07 LAB — POCT GLYCOSYLATED HEMOGLOBIN (HGB A1C): Hemoglobin A1C: 7.2

## 2015-07-07 NOTE — Patient Instructions (Addendum)
Reduce Novolog to 4 in am if eating smaller meal, add a protein in am daily  Check blood sugars on waking up  3 times a week Also check blood sugars about 2 hours after a meal and do this after different meals by rotation  Recommended blood sugar levels on waking up is 90-130 and about 2 hours after meal is 130-160  Please bring your blood sugar monitor to each visit, thank you  Toujeo: take at supper and reduce to 38  Snack before exercise  Increase exercise to 5 days

## 2015-07-07 NOTE — Progress Notes (Signed)
Pre visit review using our clinic review tool, if applicable. No additional management support is needed unless otherwise documented below in the visit note. 

## 2015-07-07 NOTE — Progress Notes (Signed)
Patient ID: [name], female   DOB: 04/21/1946, 70 y.o.   MRN: [id number]   Reason for Appointment: Diabetes follow-up   History of Present Illness   Diagnosis: Type 2 DIABETES MELITUS, date of diagnosis: 2008       PAST history: She has been on insulin for a few years Her blood sugars had initially responded very well to adding Victoza to her basal insulin in 2/13. She had lost weight and was able to comply with diet consistently. A1c had gone down to 7.2%. Also was needing less Levemir insulin .  However blood sugars will be higher if she goes off her diet  With good compliance her A1c was near normal at 6.3 in 5/14  RECENT HISTORY:    Insulin regimen: Toujeo 40 units hs; Humalog 6--6-10 before meals       She has not been seen in follow-up since 8/16 More recently her blood sugars have been overall much better control and A1c 7.2 compared to 8.0 on her last visit  Current blood sugar patterns and problems identified:  She has had difficulty with her weight and has gone back to her previous weight  With Toujeo she is using relatively larger amount of basal insulin compared to Lantus  FASTING blood sugars are fairly close to normal usually without overnight hypoglycemia  She will only occasionally check her sugars after meals during the day  She does not have a fixed lunchtime and sometimes will not eat until late afternoon and feel hypoglycemic at bedtime  Has not exercised as much and only about twice a week recently  Hypoglycemia: She has a couple of readings in the 60s in the afternoons, only rarely after exercise  She has a few postprandial readings AFTER dinner which are generally good  She does not always get a protein in the morning at breakfast  Her occasional readings during the night which are higher, possibly from snacks  She thinks her compliance with Toujeo is good in the evenings at bedtime She has been consistent with Victoza 1.8, now  taking this in the evening   Hypoglycemia: None since 12/27 She has been advised on the diet previously. She was seen by nurse educator in early 2015 and advised to have balanced meals with more protein as well avoid all regular soft drinks  Oral hypoglycemic drugs: None  Side effects from medications:  diarrhea from metformin            Proper timing of medications in relation to meals: Yes.    Monitors blood glucose:  once a day.    Glucometer:  Accu-Chek  Mean values apply above for all meters except median for One Touch  PRE-MEAL Fasting Lunch Dinner Bedtime Overall  Glucose range:  79-148   106, 122   72-159     Mean/median: 116     117    POST-MEAL PC Breakfast PC Lunch PC Dinner  Glucose range:  101   63-122   110-215   Mean/median:    139     Meals: 3 meals per day. Supper 7 pm, Lunch is sandwich at 1-2 pm Bfst, grits/egg Physical activity:  going to the [facility] using:Bike and machines, 60 min; 2-4//7 at noon            Dietician visit: Most recent: 6/13.   Wt Readings from Last 3 Encounters:  07/07/15 275 lb 9.6 oz (125.011 kg)  06/02/15 274 lb (124.286 kg)  05/13/15 270 lb  3.2 oz (122.562 kg)   Lab Results  Component Value Date   HGBA1C 7.2 07/07/2015   HGBA1C 8.0* 01/27/2015   HGBA1C 7.3* 07/05/2014   Lab Results  Component Value Date   MICROALBUR <0.7 07/01/2015   LDLCALC 115* 07/05/2014   CREATININE 0.80 07/01/2015       Medication List       This list is accurate as of: 07/07/15 11:19 AM.  Always use your most recent med list.               ACCU-CHEK FASTCLIX LANCETS Misc  USE  TO CHECK BLOOD SUGAR THREE TIMES DAILY     ACCU-CHEK NANO SMARTVIEW w/Device Kit  Use to check blood sugar 3 times per day dx code E11.65     ACCU-CHEK SMARTVIEW CONTROL Liqd  Use as directed     ACCU-CHEK SMARTVIEW test strip  Generic drug:  glucose blood  USE  TO CHECK BLOOD SUGAR THREE TIMES DAILY     ARIPiprazole 10 MG tablet  Commonly known as:  ABILIFY    Take 10 mg by mouth daily.     aspirin EC 81 MG tablet  Take 1 tablet (81 mg total) by mouth at bedtime.     atorvastatin 40 MG tablet  Commonly known as:  LIPITOR  TAKE ONE TABLET BY MOUTH IN THE EVENING     B-D SINGLE USE SWABS REGULAR Pads  Use as directed     carbidopa-levodopa 25-100 MG tablet  Commonly known as:  SINEMET IR  Take 1 tablet by mouth 3 (three) times daily.     cholestyramine 4 g packet  Commonly known as:  QUESTRAN  Take 4 g by mouth 2 (two) times daily as needed.     dextromethorphan 30 MG/5ML liquid  Commonly known as:  DELSYM  Take 15 mg by mouth as needed for cough.     doxycycline 100 MG capsule  Commonly known as:  VIBRAMYCIN  Take 100 mg by mouth 2 (two) times daily. Take for 10 Days     furosemide 40 MG tablet  Commonly known as:  LASIX  Take 40 mg by mouth every evening.     HUMALOG KWIKPEN 100 UNIT/ML KiwkPen  Generic drug:  insulin lispro  INJECT 8 UNITS SUBCUTANEOUSLY WITH BREAKFAST AND LUNCH, THEN INJECT 12 UNITS WITH SUPPER     HYDROcodone-acetaminophen 5-325 MG tablet  Commonly known as:  NORCO/VICODIN  Take 1 tablet by mouth every 4 (four) hours as needed for moderate pain.     isosorbide mononitrate 30 MG 24 hr tablet  Commonly known as:  IMDUR  Take 30 mg by mouth daily.     lamoTRIgine 150 MG tablet  Commonly known as:  LAMICTAL  Take 150 mg by mouth daily.     Liraglutide 18 MG/3ML Sopn  Commonly known as:  VICTOZA  Inject 1.8 daily     VICTOZA 18 MG/3ML Sopn  Generic drug:  Liraglutide  INJECT 1.'8MG'$  INTO THE SKIN DAILY     meloxicam 15 MG tablet  Commonly known as:  MOBIC  Take 7.5 mg by mouth daily.     olmesartan 40 MG tablet  Commonly known as:  BENICAR  Take 40 mg by mouth daily.     omega-3 acid ethyl esters 1 g capsule  Commonly known as:  LOVAZA  Take 1 g by mouth daily.     Pancrelipase (Lip-Prot-Amyl) 25000 units Cpep  Take 5 capsules by mouth 3 (three) times daily. 2  caps at breakfast, 1 at  lunch, 2 at dinner     potassium chloride SA 20 MEQ tablet  Commonly known as:  K-DUR,KLOR-CON  Take 20 mEq by mouth daily.     primidone 50 MG tablet  Commonly known as:  MYSOLINE  Take 50 mg by mouth at bedtime.     sertraline 100 MG tablet  Commonly known as:  ZOLOFT  Take 50 mg by mouth daily.     TOUJEO SOLOSTAR 300 UNIT/ML Sopn  Generic drug:  Insulin Glargine  INJECT 40 UNITS SUBCUTANEOUSLY ONCE DAILY        Allergies:  Allergies  Allergen Reactions  . Codeine     Headache and "just feel off"  . Penicillins Hives  . Sulfa Antibiotics Hives  . Other Rash    Bleach    Past Medical History  Diagnosis Date  . Diastolic heart failure 1245    Grade 1 diastolic Dysfunction by Echo   . Seizure disorder ([place])   . Hyperlipidemia     takes Lipitor daily  . Obstructive sleep apnea   . Empty sella ([place])     on MRI in 2009.  Marland Kitchen Abnormal liver function      in the past.  . Pancreatitis     takes Pancrelipase daily  . Anemia   . HTN (hypertension)     takes [employer] daily  . DM (diabetes mellitus) ([place])     takes Victoza daily as well as Lantus and Humalog  . Parkinson's disease ([place])     takes Sinemet daily  . Demand myocardial infarction [facility]) 2012    Demand Infarction in setting of Pancreatitis --> mild Troponin elevation, Non-obstructive CAD  . Peripheral edema     takes Lasix daily  . History of influenza     antibiotic given yesterday along with taking Delsym cough syrup  . Pneumonia 2012  . Headache(784.0)     last migraine-4-58yr ago  . Seizures [place]     takes Lamictal daily and Primidone nightly;last seizure 2wks ago  . Peripheral neuropathy ([place]   . Arthritis     all over  . Joint swelling   . Chronic back pain     stenosis  . Bruises easily   . History of colon polyps     benign  . Diverticulosis   . Urinary frequency   . Urinary urgency   . Nocturia   . History of blood transfusion     no abnormal reaction noted  .  Cataract     right eye;immature  . Glaucoma   . Depression     takes Abilify daily as well as Zoloft  . Chronic cough     Past Surgical History  Procedure Laterality Date  . Cholecystectomy    . Abdominal hysterectomy    . Tubal ligation    . Tonsillectomy    . Appendectomy    . Rectal polypectomy    . Eye cysts Bilateral   . Back surgery    . Colonoscopy N/A 08/15/2012    Procedure: COLONOSCOPY;  Surgeon: WArta Silence MD;  Location: [facility] END[facility]COPY;  Service: Endoscopy;  Laterality: N/A;  . Vaginal cyst removed      several times  . Esophagogastroduodenoscopy    . Lasik    . Nm myoview ltd  March 2012    Probably normal [employer] Mild reversible defect in anterior wall appears to be breast attenuation but cannot exclude ischemia:  FALSE POSITIVE  .  Cardiac catheterization  2009/March 2012    2009: (Dr. Alvie Heidelberg) Nonobstructive CAD; 2012: Minimal CAD --> false positive stress test  . Transthoracic echocardiogram  November 2010    Mild concentric LVH. EF 55-60%. No RWMA, Abnormal Relaxation (Gr 1 DD). Mildly thickened AoV leaflets w/o stenosis    Family History  Problem Relation Age of Onset  . Allergies Father   . Heart disease Father     before 106  . Heart failure Father   . Hypertension Father   . Hyperlipidemia Father   . Heart disease Mother   . Diabetes Mother   . Hypertension Mother   . Hyperlipidemia Mother   . Hypertension Sister   . Heart disease Sister     before 64  . Hyperlipidemia Sister   . Cancer Other   . Diabetes Brother   . Hypertension Brother   . Diabetes Sister   . Hypertension Sister   . Diabetes Son   . Hypertension Son     Social History:  reports that she has quit smoking. Her smoking use included Cigarettes. She has a 12.5 pack-year smoking history. She has never used smokeless tobacco. She reports that she does not drink alcohol or use illicit drugs.  ROS:   Hypertension: currently on Bystolic, Benicar and Lasix.  Does not  monitor at home  Hypercholesterolemia: On Lipitor, not clear if she has had labs checked by PCP    Lab Results  Component Value Date   CHOL 190 07/05/2014   HDL 56.80 07/05/2014   LDLCALC 115* 07/05/2014   LDLDIRECT 66.0 01/27/2015   TRIG 93.0 07/05/2014   CHOLHDL 3 07/05/2014   Foot exam done in 5/15  Edema treated with Lasix 40 mg  Taking Meloxicam again for pain in her feet    Examination:   BP 138/80 mmHg  Pulse 85  Temp(Src) 97.8 F (36.6 C) (Oral)  Wt 275 lb 9.6 oz (125.011 kg)  Body mass index is 52.1 kg/(m^2).    Assesment:   Diabetes type 2, uncontrolled    Her blood sugars have been overall better with A1c 7.2 and upper normal fructosamine See history of present illness for detailed discussion of current blood sugar patterns, problems identified and current management Her control is similar with Lantus compared to Toujeo but blood sugars are not as high in the evenings and has less variability She has difficulty losing weight but is not exercising enough She is asking for weight loss medication Also taking 1.8 Victoza which may be helping reduce mealtime insulin coverage  HYPERTENSION: Her blood pressure is being managed by PCP and now controlled  PLAN:   She will try to increase her exercise frequency and have a snack before going for exercise  She will discuss her atypical psychotropic medications with PCP as these may be making her difficult to lose weight  She can check coverage for Qsymia and may not need Victoza if she is able to take this with success  Continue same insulin except reduce breakfast coverage to 4 units if eating a smaller meal or more protein  Discussed timing and targets of blood sugars  Reduce Toujeo by 2 units for now to avoid potential hypoglycemia  She will discuss reducing doses of her psychotropic medications with PCP   Patient Instructions  Reduce Novolog to 4 in am if eating smaller meal, add a protein in am  daily  Check blood sugars on waking up  3 times a week Also check blood sugars about  2 hours after a meal and do this after different meals by rotation  Recommended blood sugar levels on waking up is 90-130 and about 2 hours after meal is 130-160  Please bring your blood sugar monitor to each visit, thank you  Toujeo: take at supper and reduce to 38  Snack before exercise  Increase exercise to 5 days       Laylanie Kruczek 07/07/2015, 11:19 AM   Labs:  Office Visit on 07/07/2015  Component Date Value Ref Range Status  . Hemoglobin A1C 07/07/2015 7.2   Final  Lab on 07/01/2015  Component Date Value Ref Range Status  . Sodium 07/01/2015 136  135 - 145 mEq/L Final  . Potassium 07/01/2015 4.0  3.5 - 5.1 mEq/L Final  . Chloride 07/01/2015 99  96 - 112 mEq/L Final  . CO2 07/01/2015 31  19 - 32 mEq/L Final  . Glucose, Bld 07/01/2015 116* 70 - 99 mg/dL Final  . BUN 07/01/2015 13  6 - 23 mg/dL Final  . Creatinine, Ser 07/01/2015 0.80  0.40 - 1.20 mg/dL Final  . Calcium 07/01/2015 9.0  8.4 - 10.5 mg/dL Final  . GFR 07/01/2015 91.95  >60.00 mL/min Final  . Fructosamine 07/01/2015 282  0 - 285 umol/L Final   Comment: Published reference interval for apparently healthy subjects between age 75 and 52 is 6 - 285 umol/L and in a poorly controlled diabetic population is 228 - 563 umol/L with a mean of 396 umol/L.   Marland Kitchen Microalb, Ur 07/01/2015 <0.7  0.0 - 1.9 mg/dL Final  . Creatinine,U 07/01/2015 23.9   Final  . Microalb Creat Ratio 07/01/2015 2.9  0.0 - 30.0 mg/g Final

## 2015-07-11 DIAGNOSIS — F329 Major depressive disorder, single episode, unspecified: Secondary | ICD-10-CM | POA: Diagnosis not present

## 2015-07-11 DIAGNOSIS — I10 Essential (primary) hypertension: Secondary | ICD-10-CM | POA: Diagnosis not present

## 2015-07-11 DIAGNOSIS — Z79899 Other long term (current) drug therapy: Secondary | ICD-10-CM | POA: Diagnosis not present

## 2015-07-11 DIAGNOSIS — E1165 Type 2 diabetes mellitus with hyperglycemia: Secondary | ICD-10-CM | POA: Diagnosis not present

## 2015-07-15 ENCOUNTER — Telehealth: Payer: Self-pay | Admitting: Endocrinology

## 2015-07-15 ENCOUNTER — Other Ambulatory Visit: Payer: Self-pay | Admitting: *Deleted

## 2015-07-15 MED ORDER — INSULIN ASPART 100 UNIT/ML FLEXPEN: PEN_INJECTOR | SUBCUTANEOUS | Status: DC

## 2015-07-15 NOTE — Telephone Encounter (Signed)
rx sent

## 2015-07-15 NOTE — Telephone Encounter (Signed)
Pt calling she needs rx for novolog the insurance wont pay for humalog.

## 2015-07-20 DIAGNOSIS — M2141 Flat foot [pes planus] (acquired), right foot: Secondary | ICD-10-CM | POA: Diagnosis not present

## 2015-07-20 DIAGNOSIS — M79671 Pain in right foot: Secondary | ICD-10-CM | POA: Diagnosis not present

## 2015-07-20 DIAGNOSIS — M79672 Pain in left foot: Secondary | ICD-10-CM | POA: Diagnosis not present

## 2015-07-20 DIAGNOSIS — M2142 Flat foot [pes planus] (acquired), left foot: Secondary | ICD-10-CM | POA: Diagnosis not present

## 2015-07-22 DIAGNOSIS — Z803 Family history of malignant neoplasm of breast: Secondary | ICD-10-CM | POA: Diagnosis not present

## 2015-07-22 DIAGNOSIS — Z1231 Encounter for screening mammogram for malignant neoplasm of breast: Secondary | ICD-10-CM | POA: Diagnosis not present

## 2015-07-22 NOTE — Telephone Encounter (Signed)
error 

## 2015-07-28 ENCOUNTER — Other Ambulatory Visit: Payer: Self-pay | Admitting: Endocrinology

## 2015-08-04 DIAGNOSIS — M5137 Other intervertebral disc degeneration, lumbosacral region: Secondary | ICD-10-CM | POA: Diagnosis not present

## 2015-08-04 DIAGNOSIS — M5136 Other intervertebral disc degeneration, lumbar region: Secondary | ICD-10-CM | POA: Diagnosis not present

## 2015-08-09 DIAGNOSIS — Z882 Allergy status to sulfonamides status: Secondary | ICD-10-CM | POA: Diagnosis not present

## 2015-08-09 DIAGNOSIS — F445 Conversion disorder with seizures or convulsions: Secondary | ICD-10-CM | POA: Diagnosis not present

## 2015-08-09 DIAGNOSIS — G40209 Localization-related (focal) (partial) symptomatic epilepsy and epileptic syndromes with complex partial seizures, not intractable, without status epilepticus: Secondary | ICD-10-CM | POA: Diagnosis not present

## 2015-08-09 DIAGNOSIS — E119 Type 2 diabetes mellitus without complications: Secondary | ICD-10-CM | POA: Diagnosis not present

## 2015-08-09 DIAGNOSIS — Z880 Allergy status to penicillin: Secondary | ICD-10-CM | POA: Diagnosis not present

## 2015-08-09 DIAGNOSIS — Z794 Long term (current) use of insulin: Secondary | ICD-10-CM | POA: Diagnosis not present

## 2015-08-09 DIAGNOSIS — E7800 Pure hypercholesterolemia, unspecified: Secondary | ICD-10-CM | POA: Diagnosis not present

## 2015-08-09 DIAGNOSIS — Z87891 Personal history of nicotine dependence: Secondary | ICD-10-CM | POA: Diagnosis not present

## 2015-08-09 DIAGNOSIS — E785 Hyperlipidemia, unspecified: Secondary | ICD-10-CM | POA: Diagnosis not present

## 2015-08-09 DIAGNOSIS — I10 Essential (primary) hypertension: Secondary | ICD-10-CM | POA: Diagnosis not present

## 2015-08-31 ENCOUNTER — Other Ambulatory Visit: Payer: Self-pay | Admitting: Endocrinology

## 2015-09-02 DIAGNOSIS — R110 Nausea: Secondary | ICD-10-CM | POA: Diagnosis not present

## 2015-09-02 DIAGNOSIS — K861 Other chronic pancreatitis: Secondary | ICD-10-CM | POA: Diagnosis not present

## 2015-09-17 ENCOUNTER — Other Ambulatory Visit: Payer: Self-pay | Admitting: Endocrinology

## 2015-09-30 ENCOUNTER — Other Ambulatory Visit (INDEPENDENT_AMBULATORY_CARE_PROVIDER_SITE_OTHER): Payer: Commercial Managed Care - HMO

## 2015-09-30 DIAGNOSIS — E1165 Type 2 diabetes mellitus with hyperglycemia: Secondary | ICD-10-CM | POA: Diagnosis not present

## 2015-09-30 DIAGNOSIS — Z794 Long term (current) use of insulin: Secondary | ICD-10-CM

## 2015-09-30 LAB — BASIC METABOLIC PANEL
BUN: 15 mg/dL (ref 6–23)
CO2: 29 mEq/L (ref 19–32)
Calcium: 9.2 mg/dL (ref 8.4–10.5)
Chloride: 98 mEq/L (ref 96–112)
Creatinine, Ser: 0.97 mg/dL (ref 0.40–1.20)
GFR: 73.56 mL/min (ref >60.00)
Glucose, Bld: 177 mg/dL — ABNORMAL HIGH (ref 70–99)
Potassium: 3.6 mEq/L (ref 3.5–5.1)
Sodium: 136 mEq/L (ref 135–145)

## 2015-09-30 LAB — LIPID PANEL
Cholesterol: 135 mg/dL (ref 0–200)
HDL: 62.5 mg/dL (ref >39.00)
LDL Cholesterol: 59 mg/dL (ref 0–99)
NonHDL: 72.84
Total CHOL/HDL Ratio: 2
Triglycerides: 71 mg/dL (ref 0.0–149.0)
VLDL: 14.2 mg/dL (ref 0.0–40.0)

## 2015-09-30 LAB — HEMOGLOBIN A1C: Hgb A1c MFr Bld: 7.6 % — ABNORMAL HIGH (ref 4.6–6.5)

## 2015-10-05 ENCOUNTER — Ambulatory Visit (INDEPENDENT_AMBULATORY_CARE_PROVIDER_SITE_OTHER): Payer: Commercial Managed Care - HMO | Admitting: Endocrinology

## 2015-10-05 ENCOUNTER — Encounter: Payer: Self-pay | Admitting: Endocrinology

## 2015-10-05 VITALS — BP 128/72 | HR 82 | Temp 97.9°F | Resp 14 | Ht 61.0 in | Wt 267.8 lb

## 2015-10-05 DIAGNOSIS — Z794 Long term (current) use of insulin: Secondary | ICD-10-CM

## 2015-10-05 DIAGNOSIS — E1165 Type 2 diabetes mellitus with hyperglycemia: Secondary | ICD-10-CM | POA: Diagnosis not present

## 2015-10-05 DIAGNOSIS — E785 Hyperlipidemia, unspecified: Secondary | ICD-10-CM

## 2015-10-05 DIAGNOSIS — I10 Essential (primary) hypertension: Secondary | ICD-10-CM

## 2015-10-05 NOTE — Progress Notes (Signed)
Patient ID: [name], female   DOB: 1946-01-06, 70 y.o.   MRN: [id number]   Reason for Appointment: Diabetes follow-up   History of Present Illness   Diagnosis: Type 2 DIABETES MELITUS, date of diagnosis: 2008       PAST history: She has been on insulin for a few years Her blood sugars had initially responded very well to adding Victoza to her basal insulin in 2/13. She had lost weight and was able to comply with diet consistently. A1c had gone down to 7.2%. Also was needing less Levemir insulin .  However blood sugars will be higher if she goes off her diet  With good compliance her A1c was near normal at 6.3 in 5/14  RECENT HISTORY:    Insulin regimen: Toujeo 38 units hs; Humalog 4/6--6-10 before meals       Although her blood sugars are looking fairly good at home and averaging only one 11 her A1c is slightly higher at 7.6  Current blood sugar patterns and problems identified:  She has had weight loss now with losing 8 pounds since her last visit  She thinks she is trying to exercise more consistently low  Also usually trying to watch her diet  Has occasional high readings late at night or morning possibly related to more carbohydrate, snacks or possibly missing mealtime doses of insulin  She does get sporadic low blood sugars overnight at about 2 AM, once or twice a month but does not check her sugar at that time  Usually not adjusting her mealtime doses at suppertime even though her intake may be variable.  She does take less insulin in the morning when eating eggs instead of grits  Only sporadic low sugars present in the afternoon but she thinks occasionally she may feel low at lunchtime also She has been consistent with Victoza 1.8, now taking this in the evening   Hypoglycemia: None since 12/27 She has been advised on the diet previously. She was seen by nurse educator in early 2015 and advised to have balanced meals with more protein as well avoid  all regular soft drinks  Oral hypoglycemic drugs: None  Side effects from medications:  diarrhea from metformin            Proper timing of medications in relation to meals: Yes.    Monitors blood glucose:  once a day.    Glucometer:  Accu-Chek  Mean values apply above for all meters except median for One Touch  PRE-MEAL Fasting Lunch Dinner Bedtime Overall  Glucose range: 63-179  76-129  59-157  92-280    Mean/median: 114  99  105   111    Meals: 3 meals per day. Supper 7 pm, Lunch is sandwich at 1-2 pm Bfst: grits/egg or biscuit Physical activity:  going to the [facility] using: Bike and machines, 60 min; 2-4//7 at noon            Dietician visit: Most recent: 6/13.   Wt Readings from Last 3 Encounters:  10/05/15 267 lb 12.8 oz (121.473 kg)  07/07/15 275 lb 9.6 oz (125.011 kg)  06/02/15 274 lb (124.286 kg)   Lab Results  Component Value Date   HGBA1C 7.6* 09/30/2015   HGBA1C 7.2 07/07/2015   HGBA1C 8.0* 01/27/2015   Lab Results  Component Value Date   MICROALBUR <0.7 07/01/2015   LDLCALC 59 09/30/2015   CREATININE 0.97 09/30/2015       Medication List  This list is accurate as of: 10/05/15 11:59 PM.  Always use your most recent med list.               ACCU-CHEK FASTCLIX LANCETS Misc  USE  TO CHECK BLOOD SUGAR THREE TIMES DAILY     ACCU-CHEK NANO SMARTVIEW w/Device Kit  Use to check blood sugar 3 times per day dx code E11.65     ACCU-CHEK SMARTVIEW CONTROL Liqd  Use as directed     ACCU-CHEK SMARTVIEW test strip  Generic drug:  glucose blood  USE  TO CHECK BLOOD SUGAR THREE TIMES DAILY     ARIPiprazole 10 MG tablet  Commonly known as:  ABILIFY  Take 10 mg by mouth daily.     aspirin EC 81 MG tablet  Take 1 tablet (81 mg total) by mouth at bedtime.     atorvastatin 40 MG tablet  Commonly known as:  LIPITOR  TAKE ONE TABLET BY MOUTH IN THE EVENING     B-D SINGLE USE SWABS REGULAR Pads  Use as directed     carbidopa-levodopa 25-100 MG tablet    Commonly known as:  SINEMET IR  Take 1 tablet by mouth 3 (three) times daily.     cholestyramine 4 g packet  Commonly known as:  QUESTRAN  Take 4 g by mouth 2 (two) times daily as needed.     dextromethorphan 30 MG/5ML liquid  Commonly known as:  DELSYM  Take 15 mg by mouth as needed for cough.     doxycycline 100 MG capsule  Commonly known as:  VIBRAMYCIN  Take 100 mg by mouth 2 (two) times daily. Take for 10 Days     furosemide 40 MG tablet  Commonly known as:  LASIX  Take 40 mg by mouth every evening.     HYDROcodone-acetaminophen 5-325 MG tablet  Commonly known as:  NORCO/VICODIN  Take 1 tablet by mouth every 4 (four) hours as needed for moderate pain.     insulin aspart 100 UNIT/ML FlexPen  Commonly known as:  NOVOLOG FLEXPEN  Inject 6 units before breakfast and lunch and 10 units before dinner     Insulin Glargine 300 UNIT/ML Sopn  Commonly known as:  TOUJEO SOLOSTAR  Inject 38 Units into the skin daily with breakfast.     isosorbide mononitrate 30 MG 24 hr tablet  Commonly known as:  IMDUR  Take 30 mg by mouth daily.     lamoTRIgine 150 MG tablet  Commonly known as:  LAMICTAL  Take 150 mg by mouth daily.     Liraglutide 18 MG/3ML Sopn  Commonly known as:  VICTOZA  Inject 1.8 daily     meloxicam 15 MG tablet  Commonly known as:  MOBIC  Take 7.5 mg by mouth daily.     olmesartan 40 MG tablet  Commonly known as:  BENICAR  Take 40 mg by mouth daily.     omega-3 acid ethyl esters 1 g capsule  Commonly known as:  LOVAZA  Take 1 g by mouth daily.     Pancrelipase (Lip-Prot-Amyl) 25000 units Cpep  Take 5 capsules by mouth 3 (three) times daily. 2 caps at breakfast, 1 at lunch, 2 at dinner     potassium chloride SA 20 MEQ tablet  Commonly known as:  K-DUR,KLOR-CON  Take 20 mEq by mouth daily.     primidone 50 MG tablet  Commonly known as:  MYSOLINE  Take 50 mg by mouth at bedtime.     sertraline  100 MG tablet  Commonly known as:  ZOLOFT  Take 50 mg  by mouth daily.        Allergies:  Allergies  Allergen Reactions  . Codeine     Headache and "just feel off"  . Penicillins Hives  . Sulfa Antibiotics Hives  . Other Rash    Bleach    Past Medical History  Diagnosis Date  . Diastolic heart failure 3086    Grade 1 diastolic Dysfunction by Echo   . Seizure disorder ([place])   . Hyperlipidemia     takes Lipitor daily  . Obstructive sleep apnea   . Empty sella ([place])     on MRI in 2009.  Marland Kitchen Abnormal liver function      in the past.  . Pancreatitis     takes Pancrelipase daily  . Anemia   . HTN (hypertension)     takes [employer] daily  . DM (diabetes mellitus) ([place])     takes Victoza daily as well as Lantus and Humalog  . Parkinson's disease ([place])     takes Sinemet daily  . Demand myocardial infarction [facility]) 2012    Demand Infarction in setting of Pancreatitis --> mild Troponin elevation, Non-obstructive CAD  . Peripheral edema     takes Lasix daily  . History of influenza     antibiotic given yesterday along with taking Delsym cough syrup  . Pneumonia 2012  . Headache(784.0)     last migraine-4-65yr ago  . Seizures ([place]     takes Lamictal daily and Primidone nightly;last seizure 2wks ago  . [place]   . Arthritis     all over  . Joint swelling   . Chronic back pain     stenosis  . Bruises easily   . History of colon polyps     benign  . Diverticulosis   . Urinary frequency   . Urinary urgency   . Nocturia   . History of blood transfusion     no abnormal reaction noted  . Cataract     right eye;immature  . Glaucoma   . Depression     takes Abilify daily as well as Zoloft  . Chronic cough     Past Surgical History  Procedure Laterality Date  . Cholecystectomy    . Abdominal hysterectomy    . Tubal ligation    . Tonsillectomy    . Appendectomy    . Rectal polypectomy    . Eye cysts Bilateral   . Back surgery    . Colonoscopy N/A 08/15/2012    Procedure:  COLONOSCOPY;  Surgeon: WArta Silence MD;  Location: [facility]OS[facility];  Service: Endoscopy;  Laterality: N/A;  . Vaginal cyst removed      several times  . Esophagogastroduodenoscopy    . Lasik    . Nm myoview ltd  March 2012    Probably normal [employer] Mild reversible defect in anterior wall appears to be breast attenuation but cannot exclude ischemia:  FALSE POSITIVE  . Cardiac catheterization  2009/March 2012    2009: (Dr. KAlvie [place] Nonobstructive CAD; 2012: Minimal CAD --> false positive stress test  . Transthoracic echocardiogram  November 2010    Mild concentric LVH. EF 55-60%. No RWMA, Abnormal Relaxation (Gr 1 DD). Mildly thickened AoV leaflets w/o stenosis    Family History  Problem Relation Age of Onset  . Allergies Father   . Heart disease Father     before 690 . Heart failure Father   .  Hypertension Father   . Hyperlipidemia Father   . Heart disease Mother   . Diabetes Mother   . Hypertension Mother   . Hyperlipidemia Mother   . Hypertension Sister   . Heart disease Sister     before 39  . Hyperlipidemia Sister   . Cancer Other   . Diabetes Brother   . Hypertension Brother   . Diabetes Sister   . Hypertension Sister   . Diabetes Son   . Hypertension Son     Social History:  reports that she has quit smoking. Her smoking use included Cigarettes. She has a 12.5 pack-year smoking history. She has never used smokeless tobacco. She reports that she does not drink alcohol or use illicit drugs.  ROS:   Hypertension: currently on Bystolic, Benicar and Lasix.  Does not monitor at home  Hypercholesterolemia: On Lipitor, not clear if she has had labs checked by PCP    Lab Results  Component Value Date   CHOL 135 09/30/2015   HDL 62.50 09/30/2015   LDLCALC 59 09/30/2015   LDLDIRECT 66.0 01/27/2015   TRIG 71.0 09/30/2015   CHOLHDL 2 09/30/2015   Foot exam done in 5/15, No recent records available from PCP regarding last exam  Edema treated with Lasix 40  mg     Examination:   BP 128/72 mmHg  Pulse 82  Temp(Src) 97.9 F (36.6 C)  Resp 14  Ht '5\' 1"'$  (1.549 m)  Wt 267 lb 12.8 oz (121.473 kg)  BMI 50.63 kg/m2  SpO2 93%  Body mass index is 50.63 kg/(m^2).    Assesment:   Diabetes type 2, uncontrolled    Her blood sugars have been overall better with fairly good blood sugars and recent weight loss also See history of present illness for detailed discussion of current blood sugar patterns, problems identified and current management Her A1c is slightly higher and may be related to some postprandial hyperglycemia especially after evening meal  She does report occasional nocturnal hypoglycemia even though fasting readings are usually not low Blood sugars are tending to be low before lunch and occasionally before supper also This is despite taking only small doses of mealtime insulin  dose Her control is similar with Lantus compared to Toujeo but blood sugars are not as high in the evenings and has less variability She has difficulty losing weight but is not exercising enough She is asking for weight loss medication Also taking 1.8 Victoza which may be helping reduce mealtime insulin coverage  HYPERTENSION: Her blood pressure is  now controlled  HYPERLIPIDEMIA: Well controlled  PLAN:   She will try taking her today in the morning and reduce the dose by 2 units  She can adjust her suppertime coverage based on carbohydrate intake and meal size  No consistent insulin dosing before meals  Have a protein snack midmorning if not eating any protein at breakfast  Call if having low sugars  Balanced meals, glucose was high after breakfast in the lab with inadequate protein   Patient Instructions  Toujeo 36 units in am   Check blood sugars on waking up   times a week Also check blood sugars about 2 hours after a meal esp supper and do this after different meals by rotation  Recommended blood sugar levels on waking up is 90-130  and about 2 hours after meal is 130-180  Please bring your blood sugar monitor to each visit, thank you  Supper dose: 8-12 units based on type of meal  Counseling time on subjects discussed above is over 50% of today's 25 minute visit     Jalal Rauch 10/06/2015, 9:04 AM   Labs:  Lab on 09/30/2015  Component Date Value Ref Range Status  . Hgb A1c MFr Bld 09/30/2015 7.6* 4.6 - 6.5 % Final   Glycemic Control Guidelines for People with Diabetes:Non Diabetic:  <6%Goal of Therapy: <7%Additional Action Suggested:  >8%   . Cholesterol 09/30/2015 135  0 - 200 mg/dL Final   ATP III Classification       Desirable:  < 200 mg/dL               Borderline High:  200 - 239 mg/dL          High:  > = 240 mg/dL  . Triglycerides 09/30/2015 71.0  0.0 - 149.0 mg/dL Final   Normal:  <150 mg/dLBorderline High:  150 - 199 mg/dL  . HDL 09/30/2015 62.50  >39.00 mg/dL Final  . VLDL 09/30/2015 14.2  0.0 - 40.0 mg/dL Final  . LDL Cholesterol 09/30/2015 59  0 - 99 mg/dL Final  . Total CHOL/HDL Ratio 09/30/2015 2   Final                  Men          Women1/2 Average Risk     3.4          3.3Average Risk          5.0          4.42X Average Risk          9.6          7.13X Average Risk          15.0          11.0                      . NonHDL 09/30/2015 72.84   Final   NOTE:  Non-HDL goal should be 30 mg/dL higher than patient's LDL goal (i.e. LDL goal of < 70 mg/dL, would have non-HDL goal of < 100 mg/dL)  . Sodium 09/30/2015 136  135 - 145 mEq/L Final  . Potassium 09/30/2015 3.6  3.5 - 5.1 mEq/L Final  . Chloride 09/30/2015 98  96 - 112 mEq/L Final  . CO2 09/30/2015 29  19 - 32 mEq/L Final  . Glucose, Bld 09/30/2015 177* 70 - 99 mg/dL Final  . BUN 09/30/2015 15  6 - 23 mg/dL Final  . Creatinine, Ser 09/30/2015 0.97  0.40 - 1.20 mg/dL Final  . Calcium 09/30/2015 9.2  8.4 - 10.5 mg/dL Final  . GFR 09/30/2015 73.56  >60.00 mL/min Final

## 2015-10-05 NOTE — Patient Instructions (Addendum)
Toujeo 36 units in am   Check blood sugars on waking up   times a week Also check blood sugars about 2 hours after a meal esp supper and do this after different meals by rotation  Recommended blood sugar levels on waking up is 90-130 and about 2 hours after meal is 130-180  Please bring your blood sugar monitor to each visit, thank you  Supper dose: 8-12 units based on type of meal

## 2015-10-06 MED ORDER — INSULIN GLARGINE 300 UNIT/ML ~~LOC~~ SOPN: 38.0000 [IU] | PEN_INJECTOR | Freq: Every day | SUBCUTANEOUS | Status: DC

## 2015-10-10 DIAGNOSIS — I10 Essential (primary) hypertension: Secondary | ICD-10-CM | POA: Diagnosis not present

## 2015-10-10 DIAGNOSIS — E782 Mixed hyperlipidemia: Secondary | ICD-10-CM | POA: Diagnosis not present

## 2015-10-10 DIAGNOSIS — F329 Major depressive disorder, single episode, unspecified: Secondary | ICD-10-CM | POA: Diagnosis not present

## 2015-10-10 DIAGNOSIS — E1165 Type 2 diabetes mellitus with hyperglycemia: Secondary | ICD-10-CM | POA: Diagnosis not present

## 2015-10-13 DIAGNOSIS — M5137 Other intervertebral disc degeneration, lumbosacral region: Secondary | ICD-10-CM | POA: Diagnosis not present

## 2015-10-18 ENCOUNTER — Other Ambulatory Visit: Payer: Self-pay | Admitting: *Deleted

## 2015-10-18 MED ORDER — LIRAGLUTIDE 18 MG/3ML ~~LOC~~ SOPN: PEN_INJECTOR | SUBCUTANEOUS | Status: DC

## 2015-11-22 ENCOUNTER — Other Ambulatory Visit: Payer: Self-pay | Admitting: Endocrinology

## 2015-11-25 ENCOUNTER — Other Ambulatory Visit: Payer: Self-pay | Admitting: Endocrinology

## 2015-12-22 ENCOUNTER — Other Ambulatory Visit: Payer: Self-pay

## 2015-12-22 MED ORDER — INSULIN PEN NEEDLE 31G X 8 MM MISC
Status: DC
Start: 2015-12-22 — End: 2018-04-28

## 2015-12-22 MED ORDER — BD SWAB SINGLE USE REGULAR PADS: MEDICATED_PAD | Status: DC

## 2016-01-05 ENCOUNTER — Other Ambulatory Visit: Payer: Commercial Managed Care - HMO

## 2016-01-09 ENCOUNTER — Ambulatory Visit: Payer: Commercial Managed Care - HMO | Admitting: Endocrinology

## 2016-01-09 DIAGNOSIS — N182 Chronic kidney disease, stage 2 (mild): Secondary | ICD-10-CM | POA: Diagnosis not present

## 2016-01-09 DIAGNOSIS — E559 Vitamin D deficiency, unspecified: Secondary | ICD-10-CM | POA: Diagnosis not present

## 2016-01-09 DIAGNOSIS — I129 Hypertensive chronic kidney disease with stage 1 through stage 4 chronic kidney disease, or unspecified chronic kidney disease: Secondary | ICD-10-CM | POA: Diagnosis not present

## 2016-01-09 DIAGNOSIS — E1122 Type 2 diabetes mellitus with diabetic chronic kidney disease: Secondary | ICD-10-CM | POA: Diagnosis not present

## 2016-01-09 DIAGNOSIS — R0602 Shortness of breath: Secondary | ICD-10-CM | POA: Diagnosis not present

## 2016-01-09 DIAGNOSIS — N08 Glomerular disorders in diseases classified elsewhere: Secondary | ICD-10-CM | POA: Diagnosis not present

## 2016-01-18 ENCOUNTER — Telehealth: Payer: Self-pay | Admitting: Cardiology

## 2016-01-18 NOTE — Telephone Encounter (Signed)
Received records from [facility] for appointment on 01/26/16 with Dr Ellyn Hack.  Records given to [facility] (medical records) for Dr Allison Quarry schedule on 01/26/16. lp

## 2016-01-24 ENCOUNTER — Telehealth: Payer: Self-pay | Admitting: Internal Medicine

## 2016-01-24 NOTE — Telephone Encounter (Signed)
Patient is running high blood sugars on the present medication Toujeo and would like to know what to do about it.

## 2016-01-25 NOTE — Telephone Encounter (Signed)
She can go back to taking Toujeo in the evening and increase the dose to 40 units but she needs to reschedule her canceled appointment

## 2016-01-25 NOTE — Telephone Encounter (Signed)
I contacted the pt. Pt stated yesterday her blood sugar reading in the am (fasting ) was 240. 2 hrs after taking her insulin blood sugar was 184 and then at 330 pm it was 306. Pt stated she is taking Toujeo 36 in the am. Pt stated her readings have been fluctuating since changing the toujeo injection time from the PM to the AM. Please advise, Thanks!

## 2016-01-25 NOTE — Telephone Encounter (Signed)
I contacted the pt and advised of note below and new instructions. She voiced understanding and had no further questions at this time.

## 2016-01-26 ENCOUNTER — Encounter: Payer: Self-pay | Admitting: Cardiology

## 2016-01-26 ENCOUNTER — Ambulatory Visit (INDEPENDENT_AMBULATORY_CARE_PROVIDER_SITE_OTHER): Payer: Commercial Managed Care - HMO | Admitting: Cardiology

## 2016-01-26 VITALS — BP 168/86 | HR 72 | Ht 61.0 in | Wt 274.8 lb

## 2016-01-26 DIAGNOSIS — Z6841 Body Mass Index (BMI) 40.0 and over, adult: Secondary | ICD-10-CM

## 2016-01-26 DIAGNOSIS — I2510 Atherosclerotic heart disease of native coronary artery without angina pectoris: Secondary | ICD-10-CM | POA: Diagnosis not present

## 2016-01-26 DIAGNOSIS — R0789 Other chest pain: Secondary | ICD-10-CM | POA: Diagnosis not present

## 2016-01-26 DIAGNOSIS — R0609 Other forms of dyspnea: Secondary | ICD-10-CM

## 2016-01-26 DIAGNOSIS — I10 Essential (primary) hypertension: Secondary | ICD-10-CM

## 2016-01-26 DIAGNOSIS — I5032 Chronic diastolic (congestive) heart failure: Secondary | ICD-10-CM

## 2016-01-26 MED ORDER — CARVEDILOL 6.25 MG PO TABS: 6.2500 mg | ORAL_TABLET | Freq: Two times a day (BID) | ORAL | 3 refills | Status: DC

## 2016-01-26 MED ORDER — FUROSEMIDE 40 MG PO TABS: ORAL_TABLET | ORAL | 3 refills | Status: DC

## 2016-01-26 NOTE — Progress Notes (Signed)
PCP: [name], MD  Clinic Note: Chief Complaint  Patient presents with  . Follow-up    Sob; when exerting self, and doing any activity  . Shortness of Breath    DOE    HPI: [name] is a 70 y.o. female with a PMH below who presents today for Delayed annual follow-up / repeat referral for dyspnea on exertion She has a history of demand ischemia infarction in setting of pancreatitis back in 2012 -- evaluated with a Myoview stress test that was false positive confirmed by nonocclusive CAD noted on cardiac catheterization. I saw her last in April 2016 for preoperative evaluation for back surgery. She does cardiac risk factors including type 2 diabetes mellitus, hypertension, morbid obesity and dyslipidemia. She has a diagnosis of chronic diastolic heart layer although this is not been confirmed. She also has listed CAD but was nonobstructive by catheterization in February 2012.  Recent Hospitalizations: None  Studies Reviewed:   Echocardiogram April 2016: EF 55-60% and abnormal septal motion. Otherwise normal wall motion. ~ Normal diastolic function. Redundant/mobile atrial septum without PFO. Mild RA dilation and mild to moderate LA dilation. No suggestion of hypertension.  Interval History: [name] is referred back for persistent exertional dyspnea that seems to be worse over the last month or 2. As you recall she is a morbidly obese woman who has is relatively sedentary lifestyle but multiple cardiac risk factors including hyper tension, hyperlipidemia, diabetes and obesity. She has also has a family history of CAD. She is accompanied by another woman who I'm assuming is her sister or friend, who is very insistent upon explaining that this patient is very short of breath. She notes feeling short of breath to be bending over which can easily explained by her obesity. However she notes progressively worsening exertional dyspnea and occasional tightness in her chest but mostly  dyspnea. She really doesn't describe much the way of any chest tightness or pressure, she really exerts herself. She does say that this has become a more noticeable symptom. What she does describe is occasional sharp pains in her chest that actually happened more frequently than the more concerning aching/pressure. She also notes worsening swelling in her feet and legs of late. She has a also positive review of systems, and if asked a question will say yes, but will not necessarily mention things such as PND or orthopnea. She really denies any palpitations or rapid irregular heartbeats, but when she is short of breath she starts feeling lightheaded and dizzy because of breathing hard. Once she is able to catch her breath this resolves.  No  syncope/near syncope. No TIA/amaurosis fugax symptoms. No claudication.  Interestingly, her clinic note from PCP dated July 17 notes pertinent negatives being chest pain, dyspnea, fatigue etc.  ROS: A comprehensive was performed. Review of Systems  Constitutional: Positive for malaise/fatigue (Limited ability to get around because of her dyspnea but also weight.). Negative for weight loss.  HENT: Negative for congestion and nosebleeds.   Respiratory: Positive for shortness of breath (Mostly exertional). Negative for cough and wheezing.   Cardiovascular: Positive for chest pain (Some mild pressure, but more noticeable is sharp pains that are fleeting.).       Otherwise per history of present illness  Gastrointestinal: Negative for abdominal pain, blood in stool and melena.  Genitourinary: Negative for hematuria.  Musculoskeletal: Positive for back pain. Negative for falls.  Neurological: Negative for headaches.  Endo/Heme/Allergies: Does not bruise/bleed easily.  Psychiatric/Behavioral: Negative for depression and  memory loss. The patient is not nervous/anxious and does not have insomnia.   All other systems reviewed and are negative.    Past Medical  History:  Diagnosis Date  . Abnormal liver function     in the past.  . Anemia   . Arthritis    all over  . Bruises easily   . Cataract    right eye;immature  . Chronic back pain    stenosis  . Chronic cough   . Demand myocardial infarction [facility]) 2012   Demand Infarction in setting of Pancreatitis --> mild Troponin elevation, Non-obstructive CAD  . Depression    takes Abilify daily as well as Zoloft  . Diastolic heart failure 6962   Grade 1 diastolic Dysfunction by Echo   . Diverticulosis   . DM (diabetes mellitus) ([place])    takes Victoza daily as well as Lantus and Humalog  . Empty sella ([place])    on MRI in 2009.  . Glaucoma   . Headache(784.0)    last migraine-4-107yr ago  . History of blood transfusion    no abnormal reaction noted  . History of colon polyps    benign  . History of influenza    antibiotic given yesterday along with taking Delsym cough syrup  . HTN (hypertension)    takes BKinder Morgan Energydaily  . Hyperlipidemia    takes Lipitor daily  . Joint swelling   . Nocturia   . Obstructive sleep apnea   . Pancreatitis    takes Pancrelipase daily  . Parkinson's disease ([place]    takes Sinemet daily  . Peripheral edema    takes Lasix daily  . Peripheral neuropathy ([place]   . Pneumonia 2012  . Se[place]ure disorder ([place]   . Seizures ([place]    takes Lamictal daily and Primidone nightly;last seizure 2wks ago  . Urinary frequency   . Urinary urgency     Past Surgical History:  Procedure Laterality Date  . ABDOMINAL HYSTERECTOMY    . APPENDECTOMY    . BACK SURGERY    . CARDIAC CATHETERIZATION  2009/March 2012   2009: (Dr. KAlvie Heidelberg Nonobstructive CAD; 2012: Minimal CAD --> false positive stress test  . CHOLECYSTECTOMY    . COLONOSCOPY N/A 08/15/2012   Procedure: COLONOSCOPY;  Surgeon: WArta Silence MD;  Location: [facility];  Service: Endoscopy;  Laterality: N/A;  . ESOPHAGOGASTRODUODENOSCOPY    . eye cysts Bilateral   . LASIK    . NM MYOVIEW  LTD  March 2012   Probably normal Lexiscan Myoview. Mild reversible defect in anterior wall appears to be breast attenuation but cannot exclude ischemia:  FALSE POSITIVE  . RECTAL POLYPECTOMY    . TONSILLECTOMY    . TRANSTHORACIC ECHOCARDIOGRAM  November 2010   Mild concentric LVH. EF 55-60%. No RWMA, Abnormal Relaxation (Gr 1 DD). Mildly thickened AoV leaflets w/o stenosis  . TUBAL LIGATION    . vaginal cyst removed     several times   Prior to Admission medications   Medication Sig Start Date End Date Taking? Authorizing Provider  ACCU-CHEK FASTCLIX LANCETS MISC USE  TO CHECK BLOOD SUGAR THREE TIMES DAILY 06/24/15  Yes AElayne Snare MD  Alcohol Swabs (B-D SINGLE USE SWABS REGULAR) PADS Use as directed 12/22/15  Yes AElayne Snare MD  ARIPiprazole (ABILIFY) 10 MG tablet Take 10 mg by mouth daily.   Yes Historical Provider, MD  aspirin EC 81 MG tablet Take 1 tablet (81 mg total) by mouth at bedtime. 11/12/12  Yes Vijaya  Karleen [place], MD  atorvastatin (LIPITOR) 40 MG tablet TAKE ONE TABLET BY MOUTH IN THE EVENING 05/13/14  Yes Elayne Snare, MD  Blood Glucose Calibration (ACCU-CHEK SMARTVIEW CONTROL) LIQD Use as directed 07/29/14  Yes Elayne Snare, MD  Blood Glucose Monitoring Suppl (ACCU-CHEK NANO SMARTVIEW) W/DEVICE KIT Use to check blood sugar 3 times per day dx code E11.65 07/19/14  Yes Elayne Snare, MD  carbidopa-levodopa (SINEMET IR) 25-100 MG per tablet Take 1 tablet by mouth 3 (three) times daily.  02/11/13  Yes Historical Provider, MD  cholecalciferol (VITAMIN D) 400 units TABS tablet Take 400 Units by mouth.   Yes Historical Provider, MD  cholestyramine Lucrezia Starch) 4 G packet Take 4 g by mouth 2 (two) times daily as needed.   Yes Historical Provider, MD  dextromethorphan (DELSYM) 30 MG/5ML liquid Take 15 mg by mouth as needed for cough.   Yes Historical Provider, MD  doxycycline (VIBRAMYCIN) 100 MG capsule Take 100 mg by mouth 2 (two) times daily. Take for 10 Days   Yes Historical Provider, MD  furosemide  (LASIX) 40 MG tablet Take 40 mg by mouth every evening.    Yes Historical Provider, MD  glucose blood (ACCU-CHEK SMARTVIEW) test strip USE TO CHECK BLOOD SUGAR THREE TIMES DAILY. DX: E11.65 11/22/15  Yes Elayne Snare, MD  insulin aspart (NOVOLOG FLEXPEN) 100 UNIT/ML FlexPen Inject 6 units before breakfast and lunch and 10 units before dinner 07/15/15  Yes Elayne Snare, MD  Insulin Glargine (TOUJEO SOLOSTAR) 300 UNIT/ML SOPN Inject 38 Units into the skin daily with breakfast. 10/06/15  Yes Elayne Snare, MD  Insulin Pen Needle 31G X 8 MM MISC Use to inject medication 4 times daily 12/22/15  Yes Elayne Snare, MD  isosorbide mononitrate (IMDUR) 30 MG 24 hr tablet Take 30 mg by mouth daily.  09/24/12  Yes Historical Provider, MD  lamoTRIgine (LAMICTAL) 150 MG tablet Take 150 mg by mouth daily.  06/05/14  Yes Historical Provider, MD  Liraglutide (VICTOZA) 18 MG/3ML SOPN Inject 1.8 daily 10/18/15  Yes Elayne Snare, MD  meloxicam (MOBIC) 15 MG tablet Take 7.5 mg by mouth daily.    Yes Historical Provider, MD  olmesartan (BENICAR) 40 MG tablet Take 40 mg by mouth daily.   Yes Historical Provider, MD  omega-3 acid ethyl esters (LOVAZA) 1 G capsule Take 1 g by mouth daily.   Yes Historical Provider, MD  Pancrelipase, Lip-Prot-Amyl, 25000 UNITS CPEP Take 5 capsules by mouth 3 (three) times daily. 2 caps at breakfast, 1 at lunch, 2 at dinner   Yes Historical Provider, MD  potassium chloride SA (K-DUR,KLOR-CON) 20 MEQ tablet Take 20 mEq by mouth daily.     Yes Historical Provider, MD  primidone (MYSOLINE) 50 MG tablet Take 50 mg by mouth at bedtime.  04/19/14  Yes Historical Provider, MD  sertraline (ZOLOFT) 100 MG tablet Take 50 mg by mouth daily.    Yes Historical Provider, MD  TOUJEO SOLOSTAR 300 UNIT/ML SOPN INJECT 40 UNITS SUBCUTANEOUSLY ONCE DAILY 11/25/15  Yes Elayne Snare, MD    Allergies  Allergen Reactions  . Codeine     Headache and "just feel off"  . Penicillins Hives  . Sulfa Antibiotics Hives  . Other Rash     [place]     Social History   Social History  . Marital status: Divorced    Spouse name: N/A  . Number of children: N/A  . Years of education: N/A   Occupational History  . Disabled    Social History  Main Topics  . Smoking status: Former Smoker    Packs/day: 0.50    Years: 25.00    Types: Cigarettes  . Smokeless tobacco: Never Used     Comment: quit smoking 20+ytrs ago  . Alcohol use No  . Drug use: No  . Sexual activity: No   Other Topics Concern  . None   Social History Narrative   [place] lives in [place]. She has lots of family in the area and is accompanied by her sister.   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   She has a distant history of smoking, quit 20 years ago.      Family History  Problem Relation Age of Onset  . Allergies Father   . Heart disease Father     before 66  . Heart failure Father   . Hypertension Father   . Hyperlipidemia Father   . Heart disease Mother   . Diabetes Mother   . Hypertension Mother   . Hyperlipidemia Mother   . Hypertension Sister   . Heart disease Sister     before 35  . Hyperlipidemia Sister   . Diabetes Brother   . Hypertension Brother   . Diabetes Sister   . Hypertension Sister   . Diabetes Son   . Hypertension Son   . Cancer Other     Wt Readings from Last 3 Encounters:  01/26/16 274 lb 12.8 oz (124.6 kg)  10/05/15 267 lb 12.8 oz (121.5 kg)  07/07/15 275 lb 9.6 oz (125 kg)    PHYSICAL EXAM BP (!) 168/86   Pulse 72   Ht 5' 1" (1.549 m)   Wt 274 lb 12.8 oz (124.6 kg)   BMI 51.92 kg/m  General appearance: alert, cooperative, appears stated age, no distress and Morbidly obese Neck: no adenopathy, no carotid bruit and no JVD Lungs: clear to auscultation bilaterally, normal percussion bilaterally and non-labored Heart: regular rate and rhythm; distant S1& S2 normal, soft SEM & HSM @ LSB; No, click, rub or gallop; Palpate PMI Abdomen: soft, non-tender; bowel sounds  normal; no masses; morbidly obese. Cannot palpate hepatomegaly. Extremities: extremities normal, atraumatic, no cyanosis, and edema trace Pulses: 2+ and symmetric;  Skin: mobility and turgor normal, no evidence of bleeding or bruising and no lesions noted  Neurologic: Mental status: Alert, oriented, thought content appropriate Cranial nerves: normal (II-XII grossly intact) Psych: anxious mood & affect.      Adult ECG Report  Rate: 67 ;  Rhythm: normal sinus rhythm and premature atrial contractions (PAC);   Narrative Interpretation: Relatively normal EKG. Normal axis, durations, and intervals.   Other studies Reviewed: Additional studies/ records that were reviewed today include:  Recent Labs:   Labs from 01/09/2016 from PCP:  Vitamin D 32.3 (borderline low), Hemoglobin A1c 8.3  Total cholesterol 154, HDL 83, LDL 55 with triglycerides 80.  Sodium 140, potassium 3.8, chloride 98, bicarbonate 27 BUN 11, creatinine 0.85, glucose 132 (elevated). Calcium 9.1. AST/ALT: 19/14. Alkaline phosphatase 114.  CBC: W510, H/H 12.7/39.7, platelet 279   ASSESSMENT / PLAN: Problem List Items Addressed This Visit    Morbid obesity with BMI of 50.0-59.9, adult ([place]) (Chronic)    We did see a few figure out her exertional dyspnea so she can get back in exercising. She clearly hasn't done much to help herself based on the fact that her weight has still fluctuated between 267 and 275 pounds. Her BMI is almost 52  If she has  a negative ischemic evaluation, I would consider potentially referring her for GOP surgery      Essential hypertension (Chronic)    Her pressures pretty high today which is probably contributing to diastolic dysfunction related dyspnea on exertion.  Plan: Restart carvedilol that apparently was not actually being taken.      DOE (dyspnea on exertion) - Primary    This seems to be a chronic thing for her, but has gotten worse over the last few months. She probably has some  diastolic dysfunction component with her hypertension although this was not commented on during in the echocardiogram report last year. There is clearly a component of deconditioning and obesity contributing to her symptoms.  With exacerbation of symptoms, I think we need to reassess her EF and get a good assessment of her pulmonary pressures given her obesity and possible obesity hypoventilation. We will also check an ischemic evaluation with myocardial scar perfusion scan. I am somewhat concerned about false positives with breast attenuation, however I would hope that when we read this study it can be compared to the previous study.  Just simply in order to confirm or deny I would have a low threshold of considering right left heart catheterization if the noninvasive studies are equivocal.      Relevant Orders   EKG 12-Lead (Completed)   ECHOCARDIOGRAM COMPLETE   Myocardial Perfusion Imaging   Coronary artery disease, non-occlusive (Chronic)    She did have heart catheterization back in 2012 with no sign of CAD.  When I last saw her last year, it seemed that her current level of exertional dyspnea was stable, however now it seems to have progressed. At this point I think is reasonable to pursue ischemic evaluation.      Relevant Orders   EKG 12-Lead (Completed)   ECHOCARDIOGRAM COMPLETE   Myocardial Perfusion Imaging   Chronic diastolic congestive heart failure (HCC) (Chronic)    Interestingly, although this is a diagnosis listed for her echocardiographic evaluations not commented much on diastolic dysfunction and she did not have an elevated LVEDP during her catheterization in 2012. Regardless, she remains on a ARB and Imdur  -- will start carvedilol for additional blood pressure control as well as to control her occasional palpitations that are probably related to PACs seen on EKG.  We will increase her dose of Lasix including doubling it for the next 2-3 days and then averaged used 3  days a week to take double dose /twice a day dose      Chest discomfort    Think this sharp pain she is having don't sound very anginal in nature, however she did have some pressure when she exerts herself but is somewhat more concerning for potential anginal component. Ischemic evaluation with Myoview stress test. Also consider that this could potentially be diastolic dysfunction related with her hypertension. Recommend an echocardiogram to assess diastolic parameters and pulmonary pressures.       Other Visit Diagnoses   None.     Current medicines are reviewed at length with the patient today. (+/- concerns) n/a The following changes have been made:  TART CARVEDILOL 6.25 MG TWICE DAILY= START WITH 1/2 TABLET TWICE DAILY X 6 DAYS THEN INCREASE TO 1/2 TABLET IN THE MORNING AND 1 TABLET IN THE EVENING X 6 DAYS THEN INCREASE TO 1 TABLET TWICE DAILY  TAKE FUROSEMIDE 40 MG TWICE DAILY X THE NEXT 2 DAYS THEN TAKE 40 MG OF FUROSEMIDE TWICE DAILY ON Monday-Wednesday AND Friday=TAKE 40 MG  ONCE DAILY ALL OTHER DAYS  Testing/Procedures:  Your physician has requested that you have an echocardiogram & lexiscan myoview.   Studies Ordered:   Orders Placed This Encounter  Procedures  . Myocardial Perfusion Imaging  . EKG 12-Lead  . ECHOCARDIOGRAM COMPLETE  Follow-Up:  Your physician recommends that you schedule a follow-up appointment in: WITH DR Ellyn Hack AFTER TESTING COMPLE     Glenetta Hew, M.D., M.S. Interventional Cardiologist   Pager # [phone] Phone # [phone] [address] [place], [place] [address]

## 2016-01-26 NOTE — Patient Instructions (Signed)
Medication Instructions:   START CARVEDILOL 6.25 MG TWICE DAILY= START WITH 1/2 TABLET TWICE DAILY X 6 DAYS THEN INCREASE TO 1/2 TABLET IN THE MORNING AND 1 TABLET IN THE EVENING X 6 DAYS THEN INCREASE TO 1 TABLET TWICE DAILY  TAKE FUROSEMIDE 40 MG TWICE DAILY X THE NEXT 2 DAYS THEN TAKE 40 MG OF FUROSEMIDE TWICE DAILY ON Monday-Wednesday AND Friday=TAKE 40 MG ONCE DAILY ALL OTHER DAYS  Testing/Procedures:  Your physician has requested that you have an echocardiogram. Echocardiography is a painless test that uses sound waves to create images of your heart. It provides your doctor with information about the size and shape of your heart and how well your heart's chambers and valves are working. This procedure takes approximately one hour. There are no restrictions for this procedure.   Your physician has requested that you have a lexiscan myoview. For further information please visit [email or web address]. Please follow instruction sheet, as given.    Follow-Up:  Your physician recommends that you schedule a follow-up appointment in: WITH DR [place]

## 2016-01-27 ENCOUNTER — Other Ambulatory Visit: Payer: Self-pay

## 2016-01-27 DIAGNOSIS — R0609 Other forms of dyspnea: Principal | ICD-10-CM

## 2016-01-27 DIAGNOSIS — I2510 Atherosclerotic heart disease of native coronary artery without angina pectoris: Secondary | ICD-10-CM

## 2016-01-27 MED ORDER — CARVEDILOL 6.25 MG PO TABS: 6.2500 mg | ORAL_TABLET | Freq: Two times a day (BID) | ORAL | 0 refills | Status: DC

## 2016-01-27 MED ORDER — FUROSEMIDE 40 MG PO TABS: ORAL_TABLET | ORAL | 0 refills | Status: DC

## 2016-01-27 NOTE — Telephone Encounter (Signed)
Rx(s) sent to pharmacy electronically.  

## 2016-01-28 ENCOUNTER — Encounter: Payer: Self-pay | Admitting: Cardiology

## 2016-01-28 DIAGNOSIS — R0609 Other forms of dyspnea: Principal | ICD-10-CM

## 2016-01-28 DIAGNOSIS — R0600 Dyspnea, unspecified: Secondary | ICD-10-CM | POA: Insufficient documentation

## 2016-01-28 NOTE — Assessment & Plan Note (Addendum)
Interestingly, although this is a diagnosis listed for her echocardiographic evaluations not commented much on diastolic dysfunction and she did not have an elevated LVEDP during her catheterization in 2012. Regardless, she remains on a ARB and Imdur  -- will start carvedilol for additional blood pressure control as well as to control her occasional palpitations that are probably related to PACs seen on EKG.  We will increase her dose of Lasix including doubling it for the next 2-3 days and then averaged used 3 days a week to take double dose /twice a day dose

## 2016-01-28 NOTE — Assessment & Plan Note (Signed)
Think this sharp pain she is having don't sound very anginal in nature, however she did have some pressure when she exerts herself but is somewhat more concerning for potential anginal component. Ischemic evaluation with Myoview stress test. Also consider that this could potentially be diastolic dysfunction related with her hypertension. Recommend an echocardiogram to assess diastolic parameters and pulmonary pressures.

## 2016-01-28 NOTE — Assessment & Plan Note (Signed)
We did see a few figure out her exertional dyspnea so she can get back in exercising. She clearly hasn't done much to help herself based on the fact that her weight has still fluctuated between 267 and 275 pounds. Her BMI is almost 52  If she has a negative ischemic evaluation, I would consider potentially referring her for GOP surgery

## 2016-01-28 NOTE — Assessment & Plan Note (Signed)
Her pressures pretty high today which is probably contributing to diastolic dysfunction related dyspnea on exertion.  Plan: Restart carvedilol that apparently was not actually being taken.

## 2016-01-28 NOTE — Assessment & Plan Note (Signed)
She did have heart catheterization back in 2012 with no sign of CAD.  When I last saw her last year, it seemed that her current level of exertional dyspnea was stable, however now it seems to have progressed. At this point I think is reasonable to pursue ischemic evaluation.

## 2016-01-28 NOTE — Assessment & Plan Note (Addendum)
This seems to be a chronic thing for her, but has gotten worse over the last few months. She probably has some diastolic dysfunction component with her hypertension although this was not commented on during in the echocardiogram report last year. There is clearly a component of deconditioning and obesity contributing to her symptoms.  With exacerbation of symptoms, I think we need to reassess her EF and get a good assessment of her pulmonary pressures given her obesity and possible obesity hypoventilation. We will also check an ischemic evaluation with myocardial scar perfusion scan. I am somewhat concerned about false positives with breast attenuation, however I would hope that when we read this study it can be compared to the previous study.  Just simply in order to confirm or deny I would have a low threshold of considering right left heart catheterization if the noninvasive studies are equivocal.

## 2016-02-02 ENCOUNTER — Telehealth (HOSPITAL_COMMUNITY): Payer: Self-pay

## 2016-02-02 NOTE — Telephone Encounter (Signed)
Encounter complete. 

## 2016-02-03 ENCOUNTER — Telehealth (HOSPITAL_COMMUNITY): Payer: Self-pay

## 2016-02-03 NOTE — Telephone Encounter (Signed)
Encounter complete. 

## 2016-02-07 ENCOUNTER — Ambulatory Visit (HOSPITAL_COMMUNITY)
Admission: RE | Admit: 2016-02-07 | Discharge: 2016-02-07 | Disposition: A | Discharge status: Home / Self Care | Payer: Commercial Managed Care - HMO | Source: Ambulatory Visit | Attending: Cardiology | Admitting: Cardiology

## 2016-02-07 ENCOUNTER — Encounter (INDEPENDENT_AMBULATORY_CARE_PROVIDER_SITE_OTHER): Payer: Self-pay

## 2016-02-07 ENCOUNTER — Ambulatory Visit (HOSPITAL_BASED_OUTPATIENT_CLINIC_OR_DEPARTMENT_OTHER): Payer: Commercial Managed Care - HMO

## 2016-02-07 ENCOUNTER — Other Ambulatory Visit: Payer: Self-pay

## 2016-02-07 DIAGNOSIS — E669 Obesity, unspecified: Secondary | ICD-10-CM | POA: Insufficient documentation

## 2016-02-07 DIAGNOSIS — R0609 Other forms of dyspnea: Secondary | ICD-10-CM

## 2016-02-07 DIAGNOSIS — Z87891 Personal history of nicotine dependence: Secondary | ICD-10-CM | POA: Diagnosis not present

## 2016-02-07 DIAGNOSIS — Z8249 Family history of ischemic heart disease and other diseases of the circulatory system: Secondary | ICD-10-CM | POA: Insufficient documentation

## 2016-02-07 DIAGNOSIS — R0602 Shortness of breath: Secondary | ICD-10-CM | POA: Diagnosis not present

## 2016-02-07 DIAGNOSIS — E119 Type 2 diabetes mellitus without complications: Secondary | ICD-10-CM | POA: Diagnosis not present

## 2016-02-07 DIAGNOSIS — R002 Palpitations: Secondary | ICD-10-CM | POA: Diagnosis not present

## 2016-02-07 DIAGNOSIS — G20 Parkinson's disease: Secondary | ICD-10-CM | POA: Insufficient documentation

## 2016-02-07 DIAGNOSIS — I2510 Atherosclerotic heart disease of native coronary artery without angina pectoris: Secondary | ICD-10-CM | POA: Diagnosis not present

## 2016-02-07 DIAGNOSIS — I119 Hypertensive heart disease without heart failure: Secondary | ICD-10-CM | POA: Diagnosis not present

## 2016-02-07 DIAGNOSIS — R0600 Dyspnea, unspecified: Secondary | ICD-10-CM | POA: Diagnosis present

## 2016-02-07 DIAGNOSIS — R079 Chest pain, unspecified: Secondary | ICD-10-CM | POA: Diagnosis not present

## 2016-02-07 DIAGNOSIS — Z6841 Body Mass Index (BMI) 40.0 and over, adult: Secondary | ICD-10-CM | POA: Diagnosis not present

## 2016-02-07 MED ORDER — TECHNETIUM TC 99M TETROFOSMIN IV KIT
31.6000 | PACK | Freq: Once | INTRAVENOUS | Status: AC | PRN
  Administered 2016-02-07: 31.6 via INTRAVENOUS
  Filled 2016-02-07: qty 32

## 2016-02-07 MED ORDER — AMINOPHYLLINE 25 MG/ML IV SOLN
100.0000 mg | Freq: Once | INTRAVENOUS | Status: AC
  Administered 2016-02-07: 100 mg via INTRAVENOUS

## 2016-02-07 MED ORDER — REGADENOSON 0.4 MG/5ML IV SOLN
0.4000 mg | Freq: Once | INTRAVENOUS | Status: AC
  Administered 2016-02-07: 0.4 mg via INTRAVENOUS

## 2016-02-08 ENCOUNTER — Ambulatory Visit (HOSPITAL_COMMUNITY)
Admission: RE | Admit: 2016-02-08 | Discharge: 2016-02-08 | Disposition: A | Discharge status: Home / Self Care | Payer: Commercial Managed Care - HMO | Source: Ambulatory Visit | Attending: Cardiovascular Disease | Admitting: Cardiovascular Disease

## 2016-02-08 LAB — MYOCARDIAL PERFUSION IMAGING
Peak HR: 81 {beats}/min
Rest HR: 69 {beats}/min
SDS: 0
SRS: 7
SSS: 7
TID: 0.89

## 2016-02-08 MED ORDER — TECHNETIUM TC 99M TETROFOSMIN IV KIT
29.4000 | PACK | Freq: Once | INTRAVENOUS | Status: AC | PRN
  Administered 2016-02-08: 29 via INTRAVENOUS

## 2016-02-09 NOTE — Progress Notes (Signed)
ECG is abnormal; but Images look good.  No sign of large Vessel ischemia. Pump function was not evaluated due to potential premature beats. We were able to evaluate this by the echocardiogram.-   Leonie Man, MD

## 2016-02-24 ENCOUNTER — Other Ambulatory Visit: Payer: Self-pay

## 2016-02-24 MED ORDER — LIRAGLUTIDE 18 MG/3ML ~~LOC~~ SOPN: PEN_INJECTOR | SUBCUTANEOUS | 3 refills | Status: DC

## 2016-02-28 DIAGNOSIS — H40013 Open angle with borderline findings, low risk, bilateral: Secondary | ICD-10-CM | POA: Diagnosis not present

## 2016-02-28 DIAGNOSIS — E119 Type 2 diabetes mellitus without complications: Secondary | ICD-10-CM | POA: Diagnosis not present

## 2016-02-28 DIAGNOSIS — H25813 Combined forms of age-related cataract, bilateral: Secondary | ICD-10-CM | POA: Diagnosis not present

## 2016-02-28 DIAGNOSIS — H402231 Chronic angle-closure glaucoma, bilateral, mild stage: Secondary | ICD-10-CM | POA: Diagnosis not present

## 2016-03-01 DIAGNOSIS — R0683 Snoring: Secondary | ICD-10-CM | POA: Diagnosis not present

## 2016-03-01 DIAGNOSIS — N182 Chronic kidney disease, stage 2 (mild): Secondary | ICD-10-CM | POA: Diagnosis not present

## 2016-03-01 DIAGNOSIS — I129 Hypertensive chronic kidney disease with stage 1 through stage 4 chronic kidney disease, or unspecified chronic kidney disease: Secondary | ICD-10-CM | POA: Diagnosis not present

## 2016-03-01 DIAGNOSIS — Z6841 Body Mass Index (BMI) 40.0 and over, adult: Secondary | ICD-10-CM | POA: Diagnosis not present

## 2016-03-07 ENCOUNTER — Institutional Professional Consult (permissible substitution): Payer: Medicare HMO | Admitting: Neurology

## 2016-03-07 ENCOUNTER — Telehealth: Payer: Self-pay

## 2016-03-07 NOTE — Telephone Encounter (Signed)
I called pt to advise her that Dr. Brett Fairy is out of the office today and her appt will need to be r/s.   I called home number listed, no answer, I left a message asking her to call me back. I called mobile number listed, no answer, VM said a different name than the pt, I did not leave a message.  If pt calls back, please advise her of this information and get her r/s as soon as possible.

## 2016-03-09 ENCOUNTER — Encounter (INDEPENDENT_AMBULATORY_CARE_PROVIDER_SITE_OTHER): Payer: Commercial Managed Care - HMO

## 2016-03-09 ENCOUNTER — Encounter: Payer: Self-pay | Admitting: Cardiology

## 2016-03-09 ENCOUNTER — Ambulatory Visit (INDEPENDENT_AMBULATORY_CARE_PROVIDER_SITE_OTHER): Payer: Commercial Managed Care - HMO | Admitting: Cardiology

## 2016-03-09 VITALS — BP 142/76 | HR 74 | Ht 61.0 in | Wt 277.0 lb

## 2016-03-09 DIAGNOSIS — R000 Tachycardia, unspecified: Secondary | ICD-10-CM

## 2016-03-09 DIAGNOSIS — I2510 Atherosclerotic heart disease of native coronary artery without angina pectoris: Secondary | ICD-10-CM

## 2016-03-09 DIAGNOSIS — E785 Hyperlipidemia, unspecified: Secondary | ICD-10-CM

## 2016-03-09 DIAGNOSIS — I10 Essential (primary) hypertension: Secondary | ICD-10-CM | POA: Diagnosis not present

## 2016-03-09 DIAGNOSIS — I5032 Chronic diastolic (congestive) heart failure: Secondary | ICD-10-CM | POA: Diagnosis not present

## 2016-03-09 DIAGNOSIS — R55 Syncope and collapse: Secondary | ICD-10-CM

## 2016-03-09 DIAGNOSIS — R609 Edema, unspecified: Secondary | ICD-10-CM

## 2016-03-09 DIAGNOSIS — E662 Morbid (severe) obesity with alveolar hypoventilation: Secondary | ICD-10-CM

## 2016-03-09 DIAGNOSIS — R0609 Other forms of dyspnea: Secondary | ICD-10-CM

## 2016-03-09 MED ORDER — CARVEDILOL 12.5 MG PO TABS: 12.5000 mg | ORAL_TABLET | Freq: Two times a day (BID) | ORAL | 3 refills | Status: DC

## 2016-03-09 NOTE — Patient Instructions (Signed)
YOU WEAR MONITOR FOR 4 WEEKS Your physician has recommended that you wear an event monitor. Event monitors are medical devices that record the heart's electrical activity. Doctors most often [place] these monitors to diagnose arrhythmias. Arrhythmias are problems with the speed or rhythm of the heartbeat. The monitor is a small, portable device. You can wear one while you do your normal daily activities. This is usually used to diagnose what is causing palpitations/syncope (passing out).  IF YOU HAVE ANY SYMPTOMS FOR TH FIRST 2 WEEKS , CONTINUE WITH CARVEDILOL 6.25 MG TWICE A DAY , THE INCREASE TO 12.5 MG TWICE A DAY  Your physician recommends that you schedule a follow-up appointment in: 3 MONTHS WITH DR HARDING - F/U MONITOR RESULTS.   If you need a refill on your cardiac medications before your next appointment, please call your pharmacy.

## 2016-03-09 NOTE — Progress Notes (Signed)
PCP: [name], MD  Clinic Note: Chief Complaint  Patient presents with  . Follow-up    F/U  after testing  . Tachycardia   1. Racing heart beat   2. Pre-syncope   3. Chronic diastolic congestive heart failure ([place])   4. Coronary artery disease, non-occlusive   5. Essential hypertension   6. DOE (dyspnea on exertion)   7. Dyslipidemia, goal LDL below 100   8. Obesity hypoventilation syndrome ([place])   9. Peripheral edema     HPI: [name] is a 70 y.o. female with a PMH below who presents today for Delayed annual follow-up / repeat referral for dyspnea on exertion She has a history of demand ischemia infarction in setting of pancreatitis back in 2012 -- evaluated with a Myoview stress test that was false positive confirmed by nonocclusive CAD noted on cardiac catheterization. I saw her last saw her in early August for SOB/DOE. She does cardiac risk factors including type 2 diabetes mellitus, hypertension, morbid obesity and dyslipidemia. She has a diagnosis of chronic diastolic heart layer although this is not been confirmed. She also has listed CAD but was nonobstructive by catheterization in February 2012.  Recent Hospitalizations: None  Studies Reviewed: PSH/PMH updated  Echocardiogram Aug 2017: -Normal LV chamber size with moderate LVH pattern. EF 50-55%. Severe LA dilation. Moderate RA dilation. PA pressure elevated at 38 mmHg (mild as (  Myoview 01/2106: There was no ST segment deviation noted during stress.  The study is normal.  This is a low risk study.  Interval History: [name] returns today happy to hear the results of her stress test and echo, however it really seems now that her chest discomfort episodes may very well be more related to fast heartbeat spells. She says that she has episodes that are somewhat short-lived but can be up to 5 minutes or so where she just gets profusely sweaty, short of breath and feels like her heart is eating out of her chest.  This will happen maybe once a week or so, but without any consistency. Is not necessarily associated with anything, but she does note that they happen when she is walking around doing something. Not usually at rest. She feels her chest quivering during the spells. She gets a little short of breath, and has a discomfort sensation in chest that goes away as soon as quivering stops. Her friend notes that she really becomes somewhat borderline responsive when these events occur.  She still has some exertional dyspnea, quite likely related to obesity, deconditioning but also potentially related to mildly elevated PA pressures from a component of obesity and ventilation syndrome/OSA. She showed me a video tape that her boyfriend took of her sleeping showing significant abdominal muscle use with mild snoring during sleep that looks suspicious for sleep apnea. She is scheduled for sleep apnea appointment with Dr. Asencion Partridge Dohmeier.  She still has mild chronic edema that is somewhat stable. No PND or orthopnea.  Although she may have some altered mentation with these episodes that could be borderline near 63, she has not had any true syncope or TIA/amaurosis fugax symptoms.  No claudication.  Interestingly, her clinic note from PCP dated July 17 notes pertinent negatives being chest pain, dyspnea, fatigue etc.  ROS: A comprehensive was performed. Review of Systems  Constitutional: Positive for malaise/fatigue (Limited ability to get around because of her dyspnea but also weight.). Negative for weight loss.  HENT: Negative for congestion and nosebleeds.   Respiratory: Positive  for shortness of breath (Mostly exertional). Negative for cough and wheezing.   Cardiovascular: Positive for chest pain (Some mild pressure, but more noticeable is sharp pains that are fleeting.) and palpitations (Rapid heart rate spells).       Otherwise per history of present illness  Gastrointestinal: Negative for abdominal pain,  blood in stool and melena.  Genitourinary: Negative for hematuria.  Musculoskeletal: Positive for back pain. Negative for falls.  Neurological: Positive for dizziness (Without heart rate episodes). Negative for headaches.  Endo/Heme/Allergies: Does not bruise/bleed easily.  Psychiatric/Behavioral: Negative for depression and memory loss. The patient is not nervous/anxious and does not have insomnia.   All other systems reviewed and are negative.   Past Medical History:  Diagnosis Date  . Abnormal liver function     in the past.  . Anemia   . Arthritis    all over  . Bruises easily   . Cataract    right eye;immature  . Chronic back pain    stenosis  . Chronic cough   . Demand myocardial infarction [facility]) 2012   Demand Infarction in setting of Pancreatitis --> mild Troponin elevation, Non-obstructive CAD  . Depression    takes Abilify daily as well as Zoloft  . Diastolic heart failure 6295   Grade 1 diastolic Dysfunction by Echo   . Diverticulosis   . DM (diabetes mellitus) ([name])    takes Victoza daily as well as Lantus and Humalog  . Empty sella ([place])    on MRI in 2009.  . Glaucoma   . Headache(784.0)    last migraine-4-71yr ago  . History of blood transfusion    no abnormal reaction noted  . History of colon polyps    benign  . History of influenza    antibiotic given yesterday along with taking Delsym cough syrup  . HTN (hypertension)    takes BKinder Morgan Energydaily  . Hyperlipidemia    takes Lipitor daily  . Joint swelling   . Nocturia   . Obstructive sleep apnea   . Pancreatitis    takes Pancrelipase daily  . Parkinson's disease ([place]    takes Sinemet daily  . Peripheral edema    takes Lasix daily  . [place]ipheral neuropathy ([place]   . Pneumonia 2012  . Seizure disorder ([place]   . Seizures ([place]    takes Lamictal daily and Primidone nightly;last seizure 2wks ago  . Urinary frequency   . Urinary urgency     Past Surgical History:  Procedure  Laterality Date  . ABDOMINAL HYSTERECTOMY    . APPENDECTOMY    . BACK SURGERY    . CARDIAC CATHETERIZATION  2009/March 2012   2009: (Dr. KAlvie Heidelberg Nonobstructive CAD; 2012: Minimal CAD --> false positive stress test  . CHOLECYSTECTOMY    . COLONOSCOPY N/A 08/15/2012   Procedure: COLONOSCOPY;  Surgeon: WArta Silence MD;  Location: [facility];  Service: Endoscopy;  Laterality: N/A;  . ESOPHAGOGASTRODUODENOSCOPY    . eye cysts Bilateral   . LASIK    . NM MYOVIEW LTD  March 2012; Aug 2017   Probably normal Lexiscan Myoview. Mild reversible Anterior Defect ~ Breast Attenuation. (CRO ischemia):  FALSE POSITIVE by Cath; b) 8/'17: LOW RISK, No ischemia or Infarction.  Breast Attenuation not noted.  .Marland KitchenRECTAL POLYPECTOMY    . TONSILLECTOMY    . TRANSTHORACIC ECHOCARDIOGRAM  01/2016   Normal LV chamber size with moderate LVH pattern. EF 50-55%. Severe LA dilation. Moderate RA dilation. PA pressure elevated at 38 mmHg (  mild as (  . TUBAL LIGATION    . vaginal cyst removed     several times   Prior to Admission medications   Medication Sig Start Date End Date Taking? Authorizing Provider  ACCU-CHEK FASTCLIX LANCETS MISC USE  TO CHECK BLOOD SUGAR THREE TIMES DAILY 06/24/15  Yes Elayne Snare, MD  Alcohol Swabs (B-D SINGLE USE SWABS REGULAR) PADS Use as directed 12/22/15  Yes Elayne Snare, MD  ARIPiprazole (ABILIFY) 10 MG tablet Take 10 mg by mouth daily.   Yes Historical Provider, MD  aspirin EC 81 MG tablet Take 1 tablet (81 mg total) by mouth at bedtime. 11/12/12  Yes Hosie Poisson, MD  atorvastatin (LIPITOR) 40 MG tablet TAKE ONE TABLET BY MOUTH IN THE EVENING 05/13/14  Yes Elayne Snare, MD  Blood Glucose Calibration (ACCU-CHEK SMARTVIEW CONTROL) LIQD Use as directed 07/29/14  Yes Elayne Snare, MD  Blood Glucose Monitoring Suppl (ACCU-CHEK NANO SMARTVIEW) W/DEVICE KIT Use to check blood sugar 3 times per day dx code E11.65 07/19/14  Yes Elayne Snare, MD  carbidopa-levodopa (SINEMET IR) 25-100 MG per tablet Take  1 tablet by mouth 3 (three) times daily.  02/11/13  Yes Historical Provider, MD  cholecalciferol (VITAMIN D) 400 units TABS tablet Take 400 Units by mouth.   Yes Historical Provider, MD  cholestyramine Lucrezia Starch) 4 G packet Take 4 g by mouth 2 (two) times daily as needed.   Yes Historical Provider, MD  dextromethorphan (DELSYM) 30 MG/5ML liquid Take 15 mg by mouth as needed for cough.   Yes Historical Provider, MD  doxycycline (VIBRAMYCIN) 100 MG capsule Take 100 mg by mouth 2 (two) times daily. Take for 10 Days   Yes Historical Provider, MD  furosemide (LASIX) 40 MG tablet Take 40 mg by mouth every evening.    Yes Historical Provider, MD  glucose blood (ACCU-CHEK SMARTVIEW) test strip USE TO CHECK BLOOD SUGAR THREE TIMES DAILY. DX: E11.65 11/22/15  Yes Elayne Snare, MD  insulin aspart (NOVOLOG FLEXPEN) 100 UNIT/ML FlexPen Inject 6 units before breakfast and lunch and 10 units before dinner 07/15/15  Yes Elayne Snare, MD  Insulin Glargine (TOUJEO SOLOSTAR) 300 UNIT/ML SOPN Inject 38 Units into the skin daily with breakfast. 10/06/15  Yes Elayne Snare, MD  Insulin Pen Needle 31G X 8 MM MISC Use to inject medication 4 times daily 12/22/15  Yes Elayne Snare, MD  isosorbide mononitrate (IMDUR) 30 MG 24 hr tablet Take 30 mg by mouth daily.  09/24/12  Yes Historical Provider, MD  lamoTRIgine (LAMICTAL) 150 MG tablet Take 150 mg by mouth daily.  06/05/14  Yes Historical Provider, MD  Liraglutide (VICTOZA) 18 MG/3ML SOPN Inject 1.8 daily 10/18/15  Yes Elayne Snare, MD  meloxicam (MOBIC) 15 MG tablet Take 7.5 mg by mouth daily.    Yes Historical Provider, MD  olmesartan (BENICAR) 40 MG tablet Take 40 mg by mouth daily.   Yes Historical Provider, MD  omega-3 acid ethyl esters (LOVAZA) 1 G capsule Take 1 g by mouth daily.   Yes Historical Provider, MD  Pancrelipase, Lip-Prot-Amyl, 25000 UNITS CPEP Take 5 capsules by mouth 3 (three) times daily. 2 caps at breakfast, 1 at lunch, 2 at dinner   Yes Historical Provider, MD    potassium chloride SA (K-DUR,KLOR-CON) 20 MEQ tablet Take 20 mEq by mouth daily.     Yes Historical Provider, MD  primidone (MYSOLINE) 50 MG tablet Take 50 mg by mouth at bedtime.  04/19/14  Yes Historical Provider, MD  sertraline (ZOLOFT) 100  MG tablet Take 50 mg by mouth daily.    Yes Historical Provider, MD  TOUJEO SOLOSTAR 300 UNIT/ML SOPN INJECT 40 UNITS SUBCUTANEOUSLY ONCE DAILY 11/25/15  Yes Elayne Snare, MD    Allergies  Allergen Reactions  . Codeine     Headache and "just feel off"  . Penicillins Hives  . Sulfa Antibiotics Hives  . Other Rash    [place]     Social History   Social History  . Marital status: Divorced    Spouse name: N/A  . Number of children: N/A  . Years of education: N/A   Occupational History  . Disabled    Social History Main Topics  . Smoking status: Former Smoker    Packs/day: 0.50    Years: 25.00    Types: Cigarettes  . Smokeless tobacco: Never Used     Comment: quit smoking 20+ytrs ago  . Alcohol use No  . Drug use: No  . Sexual activity: No   Other Topics Concern  . None   Social History Narrative   She lives in [place]. She has lots of family in the area and is accompanied by her sister.   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   She has a distant history of smoking, quit 20 years ago.      Family History  Problem Relation Age of Onset  . Allergies Father   . Heart disease Father     before 40  . Heart failure Father   . Hypertension Father   . Hyperlipidemia Father   . Heart disease Mother   . Diabetes Mother   . Hypertension Mother   . Hyperlipidemia Mother   . Hypertension Sister   . Heart disease Sister     before 14  . Hyperlipidemia Sister   . Diabetes Brother   . Hypertension Brother   . Diabetes Sister   . Hypertension Sister   . Diabetes Son   . Hypertension Son   . Cancer Other     Wt Readings from Last 3 Encounters:  03/09/16 277 lb (125.6 kg)  02/07/16 274 lb  (124.3 kg)  01/26/16 274 lb 12.8 oz (124.6 kg)    PHYSICAL EXAM BP (!) 142/76   Pulse 74   Ht '5\' 1"'$  (1.549 m)   Wt 277 lb (125.6 kg)   BMI 52.34 kg/m  General appearance: alert, cooperative, appears stated age, no distress and Morbidly obese Neck: no adenopathy, no carotid bruit and no JVD Lungs: clear to auscultation bilaterally, normal percussion bilaterally and non-labored Heart: regular rate and rhythm; distant S1& S2 normal, soft SEM & HSM @ LSB; No, click, rub or gallop; Palpate PMI Abdomen: soft, non-tender; bowel sounds normal; no masses; morbidly obese. Cannot palpate hepatomegaly. Extremities: extremities normal, atraumatic, no cyanosis, and edema trace Pulses: 2+ and symmetric;  Skin: mobility and turgor normal, no evidence of bleeding or bruising and no lesions noted  Neurologic: Mental status: Alert, oriented, thought content appropriate Cranial nerves: normal (II-XII grossly intact) Psych: anxious mood & affect.      Adult ECG Report Not checked.   Other studies Reviewed: Additional studies/ records that were reviewed today include:  Recent Labs:   Labs from 01/09/2016 from PCP:  Vitamin D 32.3 (borderline low), Hemoglobin A1c 8.3  Total cholesterol 154, HDL 83, LDL 55 with triglycerides 80.  Sodium 140, potassium 3.8, chloride 98, bicarbonate 27 BUN 11, creatinine 0.85, glucose 132 (elevated). Calcium 9.1. AST/ALT: 19/14. Alkaline  phosphatase 114.  CBC: W510, H/H 12.7/39.7, platelet 279   ASSESSMENT / PLAN: Problem List Items Addressed This Visit    Racing heart beat - Primary    She did not mention anything about rapid heart rate spells during her last visit, but indicates that the symptoms that she is feeling were probably more related to this then chest discomfort that were evaluated with a stress test. Not having all that frequently, but we should have enough episodes to capture a few if where a 30 day event monitor. Plan: 30 day event monitor  After  2 weeks, increased carvedilol dose to 12.5 mg twice a day (provided she has had at least one if not 2 episodes of tachycardia)       Relevant Orders   Cardiac event monitor   Pre-syncope    She does have symptoms concerning for presyncope with her rapid heart rate spells.  Plan: 30 day event monitor      Relevant Medications   carvedilol (COREG) 12.5 MG tablet   Other Relevant Orders   Cardiac event monitor   Peripheral edema    I think this is again probably more related to elevated pulmonary pressures and venous insufficiency. Continue current dose of diuretic.      Obesity hypoventilation syndrome (HCC) (Chronic)    This was diagnosed as far back as 2012 by Dr. Annamaria Boots from pulmonary medicine. At that time she was considering bariatric surgery. She supposedly has a sleep consult with Dr. Asencion Partridge Dohmeier --> hopefully sleep apnea treatment with CPAP will help lower dyspnea as she is already showing signs of increased pulmonary pressures on echo.  We talked about the importance of weight loss which requires dietary adjustment as well as exercise. Thankfully now she has a normal nuclear stress to help her feel better about exercise.      Essential hypertension (Chronic)    Blood pressures continue to be high today. Since she does have diastolic dysfunction and now having a heartbeat spells, I will further increase her carvedilol to 12.5 mg twice a day.      Relevant Medications   carvedilol (COREG) 12.5 MG tablet   Other Relevant Orders   Cardiac event monitor   Dyslipidemia, goal LDL below 100 (Chronic)    On statin. Monitored by PCP.      Relevant Medications   carvedilol (COREG) 12.5 MG tablet   DOE (dyspnea on exertion)    Clearly there may be a component of diastolic dysfunction contributing to her dyspnea based on the moderate LVH, however she probably more related to obesity hypoventilation, deconditioning and obesity related to lung disease.  Totally normal nuclear  stress test.      Relevant Orders   Cardiac event monitor   Coronary artery disease, non-occlusive (Chronic)    Nonocclusive disease by catheterization in 2012. Now with a nonischemic Myoview. This confirms that the [place] in 2012 with an anterior defect was related to breast attenuation. She remains on aspirin, statin and carvedilol.  Recommend continued glycemic control along with blood pressure and lipid control.      Relevant Medications   carvedilol (COREG) 12.5 MG tablet   Other Relevant Orders   Cardiac event monitor   Chronic diastolic congestive heart failure (HCC) (Chronic)    Moderate LVH on echo, but without really significant PND and orthopnea I suspect that any dyspnea is probably related to restrictive lung disease from obesity/obesity hypoventilation. She is on stable dose of furosemide for swelling along with carvedilol  and increase Abilify milligrams twice a day.      Relevant Medications   carvedilol (COREG) 12.5 MG tablet   Other Relevant Orders   Cardiac event monitor    Other Visit Diagnoses   None.     Current medicines are reviewed at length with the patient today. (+/- concerns) n/a The following changes have been made:  TART CARVEDILOL 6.25 MG TWICE DAILY= START WITH 1/2 TABLET TWICE DAILY X 6 DAYS THEN INCREASE TO 1/2 TABLET IN THE MORNING AND 1 TABLET IN THE EVENING X 6 DAYS THEN INCREASE TO 1 TABLET TWICE DAILY  TAKE FUROSEMIDE 40 MG TWICE DAILY X THE NEXT 2 DAYS THEN TAKE 40 MG OF FUROSEMIDE TWICE DAILY ON Monday-Wednesday AND Friday=TAKE 40 MG ONCE DAILY ALL OTHER DAYS  Testing/Procedures:  Your physician has requested that you have an echocardiogram & lexiscan myoview.   Studies Ordered:   Orders Placed This Encounter  Procedures  . Cardiac event monitor  Follow-Up:  Your physician recommends that you schedule a follow-up appointment in: WITH DR Ellyn Hack AFTER TESTING COMPLE     Glenetta Hew, M.D., M.S. Interventional  Cardiologist   Pager # [phone] Phone # [phone] [address] [place], [place] [address]

## 2016-03-10 ENCOUNTER — Encounter: Payer: Self-pay | Admitting: Cardiology

## 2016-03-10 NOTE — Assessment & Plan Note (Signed)
Nonocclusive disease by catheterization in 2012. Now with a nonischemic Myoview. This confirms that the [place] in 2012 with an anterior defect was related to breast attenuation. She remains on aspirin, statin and carvedilol.  Recommend continued glycemic control along with blood pressure and lipid control.

## 2016-03-10 NOTE — Assessment & Plan Note (Signed)
She did not mention anything about rapid heart rate spells during her last visit, but indicates that the symptoms that she is feeling were probably more related to this then chest discomfort that were evaluated with a stress test. Not having all that frequently, but we should have enough episodes to capture a few if where a 30 day event monitor. Plan: 30 day event monitor  After 2 weeks, increased carvedilol dose to 12.5 mg twice a day (provided she has had at least one if not 2 episodes of tachycardia)

## 2016-03-10 NOTE — Assessment & Plan Note (Signed)
I think this is again probably more related to elevated pulmonary pressures and venous insufficiency. Continue current dose of diuretic.

## 2016-03-10 NOTE — Assessment & Plan Note (Signed)
On statin. Monitored by PCP. 

## 2016-03-10 NOTE — Assessment & Plan Note (Addendum)
Clearly there may be a component of diastolic dysfunction contributing to her dyspnea based on the moderate LVH, however she probably more related to obesity hypoventilation, deconditioning and obesity related to lung disease.  Totally normal nuclear stress test.

## 2016-03-10 NOTE — Assessment & Plan Note (Signed)
She does have symptoms concerning for presyncope with her rapid heart rate spells.  Plan: 30 day event monitor

## 2016-03-10 NOTE — Assessment & Plan Note (Signed)
Blood pressures continue to be high today. Since she does have diastolic dysfunction and now having a heartbeat spells, I will further increase her carvedilol to 12.5 mg twice a day.

## 2016-03-10 NOTE — Assessment & Plan Note (Signed)
This was diagnosed as far back as 2012 by Dr. Annamaria Boots from pulmonary medicine. At that time she was considering bariatric surgery. She supposedly has a sleep consult with Dr. Asencion Partridge Dohmeier --> hopefully sleep apnea treatment with CPAP will help lower dyspnea as she is already showing signs of increased pulmonary pressures on echo.  We talked about the importance of weight loss which requires dietary adjustment as well as exercise. Thankfully now she has a normal nuclear stress to help her feel better about exercise.

## 2016-03-10 NOTE — Assessment & Plan Note (Signed)
Moderate LVH on echo, but without really significant PND and orthopnea I suspect that any dyspnea is probably related to restrictive lung disease from obesity/obesity hypoventilation. She is on stable dose of furosemide for swelling along with carvedilol and increase Abilify milligrams twice a day.

## 2016-03-15 ENCOUNTER — Encounter: Payer: Self-pay | Admitting: Cardiology

## 2016-03-20 ENCOUNTER — Telehealth: Payer: Self-pay | Admitting: Cardiology

## 2016-03-20 NOTE — Telephone Encounter (Signed)
[name] is having eye surgery on Monday 03/26/16 and is wanting to know do she wear the holter monitor for surgery .Marland Kitchen Please call

## 2016-03-20 NOTE — Telephone Encounter (Signed)
Left message to call back  

## 2016-03-20 NOTE — Telephone Encounter (Signed)
LEFT MESSAGE TO CALL BACK Patient may take monitr off while having surgery - (during surgery she will be monitor) then place monitor back on or wear the monitor if okay  With eye doctor and anesthesiologist .

## 2016-03-21 NOTE — Telephone Encounter (Signed)
F/u ° ° ° ° ° °Pt returning nurse call.  °

## 2016-03-21 NOTE — Telephone Encounter (Signed)
Spoke to patient. She can do 1of 2 things  1- wear to surgery  - and take off if asked by surgical center - place monitor back on after surgery. 2- do not wear to center and -place monitor on when return home  Patient verbalized understanding.

## 2016-03-28 ENCOUNTER — Other Ambulatory Visit: Payer: Self-pay

## 2016-03-28 NOTE — Patient Outreach (Signed)
[facility]) Care Management  03/28/2016  ROXANA LUKE 06/11/1946 [id number]     Telephone Screen  Referral Date: 03/27/16 Referral Source: EMMI Prevent Call Referral Reason: HTN,DM    Outreach attempt # 1 to patient. No answer at present. RN CM left HIPAA compliant voicemail message along with contact info.      Plan: RN CM will make outreach attempt to patient within a week if no return call from patient.  Enzo Montgomery, RN,BSN,CCM [facility] Telephonic Care Management Coordinator Direct Phone: [phone] Toll Free: [phone] Fax: [phone]

## 2016-03-29 ENCOUNTER — Other Ambulatory Visit: Payer: Self-pay

## 2016-03-29 NOTE — Patient Outreach (Addendum)
[facility]) Care Management  03/29/2016  [name] October 19, 1945 [id number]     Telephone Screen  Referral Date: 03/27/16 Referral Source: EMMI Prevent Call Referral Reason: HTN,DM    Voicemail message received from patient. Return call to patient.  Social: Patient resides in her home. She is currently living with her sister and brother in law who assist as needed with her care needs. Patient is independent with most ADLs/IADLs. She does not drive and relies on family to transport her to appts. She denies any recent falls. DME in the home include cbg meter, cane, walker, shower chair and BSC.   Conditions: She has PMH of CHF, CAD, DM, HTN, HLD, MI(2012), depression, seizures, obesity and hypoventilation syndrome. She reports that she monitors her weight about 2x/week. She does voice some issues with edema and SOB at times. Patient currently wearing a holter monitor for 30 days(will end on 10/15). She is monitoring blood sugars in the home. She states that her blood sugars are "crazy." Patient reports that cbgs range from the 300s to 40s. She had a hypoglycemic event last week where her cbg dropped to 42 and she was very symptomatic. Her sister called EMS and she was treated in the home. She states she was supposed to have cataracts surgery on last week. However, she cancelled surgery due to her hypoglycemia events. A1C was 8.3 in July. Patient reports history of seizures. Last seizure was in July. She reported some depression related to witnessing her nephew die in the bathroom at her home in May. She states that since that episode she has noticed that her overall health has worsened.    Medications: Patient taking greater than 15 meds. Denies any issues with affordability at this time.  Appointments: Patient reported she saw PCP on last month. She goes to see cardiologist(Dr. Ellyn Hack) in Dec. She also sees opthalmologist(Dr. Katy Fitch).  Advance Directives; patient  states she has completed living will and HCPOA(designated her sister as HCPOA). She is unsure if medical team has a copy. She will verify and if not provide copy to her medical team.  Consent: [facility] services reviewed and discussed. Patient gave verbal consent for services.   Plan: RN CM will notify [facility] administrative assistant of case status. RN CM will send [facility] community referral for further in home eval and assessment of care needs. RN CM will send [facility] pharmacy referral for polypharmacy med review. RN CM provided patient with [facility] contact info for future reference.   Enzo Montgomery, RN,BSN,CCM [facility] Telephonic Care Management Coordinator Direct Phone: [phone] Toll Free: [phone] Fax: [phone]

## 2016-03-29 NOTE — Telephone Encounter (Signed)
This encounter was created in error - please disregard.

## 2016-04-02 ENCOUNTER — Ambulatory Visit (INDEPENDENT_AMBULATORY_CARE_PROVIDER_SITE_OTHER): Payer: Commercial Managed Care - HMO | Admitting: Neurology

## 2016-04-02 ENCOUNTER — Ambulatory Visit: Payer: Self-pay

## 2016-04-02 ENCOUNTER — Encounter: Payer: Self-pay | Admitting: Neurology

## 2016-04-02 VITALS — BP 160/102 | HR 60 | Resp 20 | Ht 60.0 in | Wt 281.0 lb

## 2016-04-02 DIAGNOSIS — E669 Obesity, unspecified: Secondary | ICD-10-CM

## 2016-04-02 DIAGNOSIS — R4189 Other symptoms and signs involving cognitive functions and awareness: Secondary | ICD-10-CM

## 2016-04-02 DIAGNOSIS — R251 Tremor, unspecified: Secondary | ICD-10-CM

## 2016-04-02 DIAGNOSIS — G4733 Obstructive sleep apnea (adult) (pediatric): Secondary | ICD-10-CM | POA: Diagnosis not present

## 2016-04-02 DIAGNOSIS — R0683 Snoring: Secondary | ICD-10-CM

## 2016-04-02 NOTE — Progress Notes (Signed)
SLEEP MEDICINE CLINIC   Provider:  Larey Seat, M D  Referring Provider: [name], MD Primary Care Physician:  [name], MD  Chief Complaint  Patient presents with  . New Patient (Initial Visit)    snoring, had sleep study, was on cpap in the hospital    HPI:  [name] is seen here as a referral from Dr. Baird Cancer for a sleep medicine consultation.  [name] is a 70 year old right handed African-American female with a history of  Super- obesity, hypertension, shortness of breath and palpitations. These symptoms and risk factors for OSA  led to her referral here today.  She has also family history of coronary artery disease in her father . She has hypertension , which has been exacerbated by anxiety and stress. Recently she doesn't have as many headaches anymore (  better HTN control ) , and she reports compliance with her medication. She was just placed on a cardiac monitor recently and is still wearing the device. She has been multiple times hospitalized for pancreatitis.  She spoke of coming out of a coma, and having to be resuscitated 3 time ( ? ) .  Her past medical history further lists diabetes type 2 uncontrolled , on insulin, anemia and the year 2014, diabetic polyneuropathy since 2014, autonomic neuropathy, vitamin D deficiency, arthritis and joint pain.  The patient is on a long list of medications her medications include iron and vitamin B supplements, cholestyramine, meloxicam, primidone, Victoza, carbidopa levodopa, toujeo solo star insulin, NovoLog insulin,  losartan, Klor-Con, aspirin, metoprolol, Abilify, Lyrica, Lamictal, sertraline. Her hemoglobin A1c was 8.3 on 01/09/2016, B type natruretic peptide was 103.9, total cholesterol was 154, comprehensive metabolic panel showed high glucose fasting sugar of 132, CBC and differential were normal range. It is not clear to me if she takes primidone for seizures or tremor.   Included in her referral  chart is a sleep study from the [facility], The 11/20/2011 attending physician was Dr. Sheppard Coil, the patient endorsed the Epworth scale at 14 points and was tested with an AHI of 17.9 titrated to CPAP pressure of 12 cm water. Total AHI was still 4.6. The patient was prescribed CPAP at 12 cm water pressure without EPR, CPAP was prescribed on 11/21/2011. The patient reports that she was placed on a CPAP was full face mask after recovering from a pancreatitis during the hospitalization. The sleep study took place after her hospitalization at the [facility] in an [place]. She never took the CPAP or more used it. She was claustrophobic. She reports that there was no other interface tried and a full facemask, no desensitization was done. She had been called by the DME multiple times , but declined to see the RT.    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 (My doctor told me" I cannot live by myself anymore" ) She does not have an established bedtime routine. Her bedroom has a TV and she usually runs at night, all night in the background. She also has a bright light in her bedroom on all night to find her way to the bathroom better. She usually goes to her bedroom between 9 and 9:30 PM but she needs to take her insulin at 10 or 10:30 so there is a interruption right away. Some nights she will wake up with palpitations and diaphoresis, likely if she overslept her  insulin dosage time. She sleeps on 2 pillows, prefers to sleep on her sides but moves a lot. She usually needs more than 30 minutes between going to bed and falling asleep. She lives with her sister, her husband and her son. She sleeps with the dog in the bedroom and in the bed. Twice at night her dog will call her out to go to the bathroom. She herself has the urge to urinate at least 4-5 times at night. Wakes up spontaneously at about 5 AM but rises  not before 6 or 6:30 AM. Usually she feels rested and ready to go at that time, she frequently has a dry mouth in the morning, but no headaches.  Sleep medical history and family sleep history:  He has suffered from insomnia all her life. Chronic insomnia. His was diagnosed with anxiety and depression after the death of her father, in 71. Her insomnia became progressively worse even on psychotropic medications, tranquilizer, antidepressants. She had a tonsillectomy in the 1960. She was not sleepier than her peers in school, he couldn't easily sleep ever.   Social history: one son, history of third shift work until 2009, worked nights for 20 years.   Review of Systems: Out of a complete 14 system review, the patient complains of only the following symptoms, and all other reviewed systems are negative. This patient endorsed aching muscles, muscle cramps, joint swelling, joint pain, incontinence, shortness of breath and snoring, witnessed snoring and apneas, weight gain and fatigue, daytime sleepiness, slurred speech, tremors, the feeling of not getting enough sleep.  She has had back surgery in 2016 and 1970,  hysterectomy and tubal ligation in 1980s, tonsillectomy in the 1960s, cholecystectomy 2012.  Epworth score 17  How likely are you to doze in the following situations: 0 = not likely, 1 = slight chance, 2 = moderate chance, 3 = high chance  Sitting and Reading? 2 Watching Television? 2 Sitting inactive in a public place (theater or meeting)? 3 As a passenger in a car for an hour without a break?1 Lying down in the afternoon when circumstances permit? 3 Sitting and talking to someone? 2 Sitting quietly after lunch without alcohol?3 In a car, while stopped for a few minutes in traffic? 1   Total = 17  , Fatigue severity score 56 , depression score 1/ 15    Social History   Social History  . Marital status: Divorced    Spouse name: N/A  . Number of children: N/A  . Years of  education: N/A   Occupational History  . Disabled    Social History Main Topics  . Smoking status: Former Smoker    Packs/day: 0.50    Years: 25.00    Types: Cigarettes  . Smokeless tobacco: Never Used     Comment: quit smoking 20+ytrs ago  . Alcohol use No  . Drug use: No  . Sexual activity: No   Other Topics Concern  . Not on file   Social History Narrative   She lives in [place]. She has lots of family in the area and is accompanied by her sister.   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   She has a distant history of smoking, quit 20 years ago.       Family History  Problem Relation Age of Onset  . Allergies Father   . Heart disease Father     before 48  . Heart failure Father   . Hypertension  Father   . Hyperlipidemia Father   . Heart disease Mother   . Diabetes Mother   . Hypertension Mother   . Hyperlipidemia Mother   . Hypertension Sister   . Heart disease Sister     before 48  . Hyperlipidemia Sister   . Diabetes Brother   . Hypertension Brother   . Diabetes Sister   . Hypertension Sister   . Diabetes Son   . Hypertension Son   . Cancer Other     Past Medical History:  Diagnosis Date  . Abnormal liver function     in the past.  . Anemia   . Arthritis    all over  . Bruises easily   . Cataract    right eye;immature  . Chronic back pain    stenosis  . Chronic cough   . Demand myocardial infarction 2012   Demand Infarction in setting of Pancreatitis --> mild Troponin elevation, Non-obstructive CAD  . Depression    takes Abilify daily as well as Zoloft  . Diastolic heart failure 5885   Grade 1 diastolic Dysfunction by Echo   . Diverticulosis   . DM (diabetes mellitus) ([place])    takes Victoza daily as well as Lantus and Humalog  . Empty sella ([place])    on MRI in 2009.  . Glaucoma   . Headache(784.0)    last migraine-4-65yr ago  . History of blood transfusion    no abnormal reaction noted  . History  of colon polyps    benign  . History of influenza    antibiotic given yesterday along with taking Delsym cough syrup  . HTN (hypertension)    takes BKinder Morgan Energydaily  . Hyperlipidemia    takes Lipitor daily  . Joint swelling   . Nocturia   . Obstructive sleep apnea   . Pancreatitis    takes Pancrelipase daily  . Parkinson's disease ([place]    takes Sinemet daily  . Peripheral edema    takes Lasix daily  . Peripheral neuropathy ([place]   . Pneumonia 2012  . Seizure disorder ([place]   . Seizures ([place]    takes Lamictal daily and Primidone nightly;last seizure 2wks ago  . Urinary frequency   . Urinary urgency     Past Surgical History:  Procedure Laterality Date  . ABDOMINAL HYSTERECTOMY    . APPENDECTOMY    . BACK SURGERY    . CARDIAC CATHETERIZATION  2009/March 2012   2009: (Dr. KAlvie Heidelberg Nonobstructive CAD; 2012: Minimal CAD --> false positive stress test  . CHOLECYSTECTOMY    . COLONOSCOPY N/A 08/15/2012   Procedure: COLONOSCOPY;  Surgeon: WArta Silence MD;  Location: [facility] END[facility]COPY;  Service: Endoscopy;  Laterality: N/A;  . ESOPHAGOGASTRODUODENOSCOPY    . eye cysts Bilateral   . LASIK    . NM MYOVIEW LTD  March 2012; Aug 2017   Probably normal Lexiscan Myoview. Mild reversible Anterior Defect ~ Breast Attenuation. (CRO ischemia):  FALSE POSITIVE by Cath; b) 8/'17: LOW RISK, No ischemia or Infarction.  Breast Attenuation not noted.  .Marland KitchenRECTAL POLYPECTOMY    . TONSILLECTOMY    . TRANSTHORACIC ECHOCARDIOGRAM  01/2016   Normal LV chamber size with moderate LVH pattern. EF 50-55%. Severe LA dilation. Moderate RA dilation. PA pressure elevated at 38 mmHg (mild as (  . TUBAL LIGATION    . vaginal cyst removed     several times    Current Outpatient Prescriptions  Medication Sig Dispense Refill  . ACCU-CHEK FASTCLIX  LANCETS MISC USE  TO CHECK BLOOD SUGAR THREE TIMES DAILY 306 each 0  . Alcohol Swabs (B-D SINGLE USE SWABS REGULAR) PADS Use as directed 300 each 1    . ARIPiprazole (ABILIFY) 10 MG tablet Take 10 mg by mouth daily.    Marland Kitchen aspirin EC 81 MG tablet Take 1 tablet (81 mg total) by mouth at bedtime.    Marland Kitchen atorvastatin (LIPITOR) 40 MG tablet TAKE ONE TABLET BY MOUTH IN THE EVENING 30 tablet 3  . Blood Glucose Monitoring Suppl (ACCU-CHEK NANO SMARTVIEW) W/DEVICE KIT Use to check blood sugar 3 times per day dx code E11.65 1 kit 0  . carbidopa-levodopa (SINEMET IR) 25-100 MG per tablet Take 1 tablet by mouth 3 (three) times daily.     . carvedilol (COREG) 12.5 MG tablet Take 1 tablet (12.5 mg total) by mouth 2 (two) times daily. 180 tablet 3  . cholecalciferol (VITAMIN D) 400 units TABS tablet Take 400 Units by mouth.    . cholestyramine (QUESTRAN) 4 G packet Take 4 g by mouth 2 (two) times daily as needed.    . furosemide (LASIX) 40 MG tablet Take 40 mg twice daily on M/W/F and 40 mg once daily all other days 60 tablet 0  . glucose blood (ACCU-CHEK SMARTVIEW) test strip USE TO CHECK BLOOD SUGAR THREE TIMES DAILY. DX: E11.65 300 each 1  . insulin aspart (NOVOLOG FLEXPEN) 100 UNIT/ML FlexPen Inject 6 units before breakfast and lunch and 10 units before dinner 30 mL 1  . Insulin Pen Needle 31G X 8 MM MISC Use to inject medication 4 times daily 400 each 1  . isosorbide mononitrate (IMDUR) 30 MG 24 hr tablet Take 30 mg by mouth daily.     Marland Kitchen lamoTRIgine (LAMICTAL) 150 MG tablet Take 150 mg by mouth daily.     . Liraglutide (VICTOZA) 18 MG/3ML SOPN Inject 1.8 daily 3 pen 3  . meloxicam (MOBIC) 15 MG tablet Take 7.5 mg by mouth daily.     Marland Kitchen olmesartan (BENICAR) 40 MG tablet Take 40 mg by mouth daily.    Marland Kitchen omega-3 acid ethyl esters (LOVAZA) 1 G capsule Take 1 g by mouth daily.    . Pancrelipase, Lip-Prot-Amyl, 25000 UNITS CPEP Take 5 capsules by mouth 3 (three) times daily. 2 caps at breakfast, 1 at lunch, 2 at dinner    . potassium chloride SA (K-DUR,KLOR-CON) 20 MEQ tablet Take 20 mEq by mouth daily.      . primidone (MYSOLINE) 50 MG tablet Take 50 mg by mouth  at bedtime.     . sertraline (ZOLOFT) 100 MG tablet Take 50 mg by mouth daily.     [name] SOLOSTAR 300 UNIT/ML SOPN INJECT 40 UNITS SUBCUTANEOUSLY ONCE DAILY 6 mL 3   No current facility-administered medications for this visit.     Allergies as of 04/02/2016 - Review Complete 04/02/2016  Allergen Reaction Noted  . Codeine  10/26/2010  . Penicillins Hives 10/26/2010  . Sulfa antibiotics Hives 10/26/2010  . Other Rash 05/18/2012    Vitals: BP (!) 160/102   Pulse 60   Resp 20   Ht 5' (1.524 m)   Wt 281 lb (127.5 kg)   BMI 54.88 kg/m  Last Weight:  Wt Readings from Last 1 Encounters:  04/02/16 281 lb (127.5 kg)   KGM:WNUU mass index is 54.88 kg/m.     Last Height:   Ht Readings from Last 1 Encounters:  04/02/16 5' (1.524 m)    Physical exam:  General: The patient is awake, alert and appears not in acute distress. The patient is well groomed. Head: Normocephalic, atraumatic. Neck is supple. Mallampati 5, uvula invisible, macroglossia. ,  neck circumference: 17 inches . Nasal airflow congested, Retrognathia is seen.  Cardiovascular:  Regular rate and rhythm , without  murmurs or carotid bruit, and without distended neck veins. Respiratory: Lungs are clear to auscultation. Skin:  Without evidence of edema, or rash Trunk: BMI is super obese . The patient's posture is hunched, she limps.   Neurologic exam : The patient is calm  and alert, oriented to place and time.   Attention span & concentration ability appears slowed   Speech is fluent, without dysarthria, dysphonia or aphasia.  Mood and affect are appropriate.  Cranial nerves: Pupils are equal and briskly reactive to light.  Extraocular movements  in vertical and horizontal planes intact and without nystagmus. Visual fields by finger perimetry are intact. Hearing to finger rub intact.   Facial sensation intact to fine touch.  Facial motor strength is symmetric and tongue and uvula move midline. Shoulder shrug was  symmetrical.   Motor exam:   Normal tone, muscle bulk and symmetric strength in all extremities.  Sensory:  Fine touch, pinprick and vibration were tested in all extremities and are attenuated in both feet.  Proprioception tested in the upper extremities was normal.  Coordination: Rapid alternating movements in the fingers/hands was normal. Finger-to-nose maneuver  normal without evidence of ataxia, dysmetria or tremor.  Gait and station: Patient walks without assistive device and is able unassisted to climb up to the exam table. Strength within normal limits.  Stance is stable and normal.  She limps.  Turns with 4 Steps. Romberg testing is  negative.  Deep tendon reflexes: in the  upper and lower extremities are symmetric and intact. Babinski maneuver response is equivocal .  The patient was advised of the nature of the diagnosed sleep disorder , the treatment options and risks for general a health and wellness arising from not treating the condition.  I spent more than 45 minutes of face to face time with the patient. Greater than 50% of time was spent in counseling and coordination of care. We have discussed the diagnosis and differential and I answered the patient's questions.   Her son lives with her and gave additional information. I appreciate Dr Baird Cancer extensive notes, too.    Assessment:  After physical and neurologic examination, review of laboratory studies,  Personal review of imaging studies, reports of other /same  Imaging studies ,  Results of polysomnography/ neurophysiology testing and pre-existing records as far as provided in visit., my assessment is   1) Mrs. Theodora Blow has an unfortunate experience with her first sleep study, may be based on misunderstanding. She was given only a full facemask and could not tolerate it, became claustrophobic and panicked. I have no doubt that this lady still have obstructive sleep apnea and that she should be invited for a split-night  polysomnography to treat her. Should she surprisingly has a mild degree of apnea without associated hypoxemia him a I would recommend a dental device. This is usually the best option for claustrophobic patient but only if no REM accentuated apnea is present and apnea is not related to significant oxygen drops.  2) Mrs. Theodora Blow also is on multiple psychotropic medications for anxiety, depression, and she has developed a tremor.   Tremor today is very mild and there is no associated cogwheeling. I noted that she takes  Mysoline as well as carpal dopa-levodopa. Under the dopamine supplementation she has had less tremors she reports. Also I cannot appreciate a dominant side she states that her right hand has more tremor usually. I would like to add that there is no cogwheeling noted no rigidity and no asymmetry of the musculature left versus right. She also does not have broken saccadic eye movements, she does not have a masked face, she does not have a shuffling gait but she does have a limp.  3) No evidence of PD here today.  4) super obesity   5 joint pain, related to osteoarthritis.     Plan:  Treatment plan and additional workup :  SPLIT night with previous desensitization with Robin Hyler.  RV after  4-6 weeks.    [name] Jonisha Kindig MD  04/02/2016  CC: [name], [place], [place] [id number]

## 2016-04-03 NOTE — Addendum Note (Signed)
Addended by: Diana Eves on: 04/03/2016 02:44 PM   Modules accepted: Orders

## 2016-04-05 ENCOUNTER — Other Ambulatory Visit: Payer: Self-pay | Admitting: *Deleted

## 2016-04-05 NOTE — Patient Outreach (Signed)
[facility]) Care Management  04/05/2016  LASHAUN MUSCARELLO 1945-07-16 [id number]   Referral received from telephonic care manager to contact for home assessment and complex case management program.  According to chart, member has history of diabetes, hypertension, coronary artery disease, and congestive heart failure.  Call placed to member, no answer.  HIPAA compliant voice message left.  Will await call back, if no call back, will follow up next week.  Valente David, [place], MSN [place] [phone]

## 2016-04-09 ENCOUNTER — Other Ambulatory Visit: Payer: Self-pay | Admitting: *Deleted

## 2016-04-09 NOTE — Patient Outreach (Signed)
[facility]) Care Management  04/09/2016  LINDASY ZAIS 1946-03-03 [id number]   Call placed to member as a second attempt to initiate contact to offer involvement with [facility].  She verifies identity, this care manager explains purpose of call.  [facility] care management services explained.  She agrees to home visit this week, will address concern and develop care plan at that time.  Valente David, [place], MSN [place] [phone]

## 2016-04-11 ENCOUNTER — Other Ambulatory Visit: Payer: Self-pay | Admitting: Endocrinology

## 2016-04-12 ENCOUNTER — Other Ambulatory Visit: Payer: Self-pay | Admitting: *Deleted

## 2016-04-12 ENCOUNTER — Encounter: Payer: Self-pay | Admitting: *Deleted

## 2016-04-12 NOTE — Patient Outreach (Signed)
[facility]) Care Management   04/12/2016  [name] 1945/10/16 [id number]  [name] is an 70 y.o. female  Subjective:   Member reports that she is doing alright.  She reports intermittent pain in her feet, but denies pain or discomfort elsewhere.  She reports that she is compliant with her medications, and manages them independently.  She state that she lives with her sister and brother in law, and they assist her in managing her care when needed, for example, when she is experiencing hypoglycemic episodes.  She report that she her blood sugar has been uncontrolled since the passing of her nephew in May.  She state that her blood sugars have ranged from 40s to 300s, with her last hypoglycemic episode last week.  She admits that her eating habits has changed over the past several months and that she has gained weight.  She state she has not seen her endocrinologist due to inability to pay copay.  Objective:   Review of Systems  Constitutional: Negative.   HENT: Negative.   Eyes: Negative.   Respiratory: Negative.   Cardiovascular: Negative.   Gastrointestinal: Negative.   Genitourinary: Negative.   Musculoskeletal: Negative.   Skin: Negative.   Neurological: Positive for seizures.       History of seizures  Endo/Heme/Allergies: Negative.   Psychiatric/Behavioral: Positive for depression.       History of depression    Physical Exam  Constitutional: She is oriented to person, place, and time. She appears well-developed and well-nourished.  Neck: Normal range of motion.  Cardiovascular: Normal rate, regular rhythm and normal heart sounds.   Respiratory: Effort normal and breath sounds normal.  GI: Soft. Bowel sounds are normal.  Musculoskeletal: Normal range of motion.  Neurological: She is alert and oriented to person, place, and time.  Skin: Skin is warm and dry.   BP 136/82   Pulse (!) 58   Resp 18   Ht 1.549 m (_0 )   Wt 281 lb (127.5  kg)   SpO2 93%   BMI 53.09 kg/m   Encounter Medications:   Outpatient Encounter Prescriptions as of 04/12/2016  Medication Sig  . ACCU-CHEK FASTCLIX LANCETS MISC USE  TO CHECK BLOOD SUGAR THREE TIMES DAILY  . Alcohol Swabs (B-D SINGLE USE SWABS REGULAR) PADS Use as directed  . ARIPiprazole (ABILIFY) 10 MG tablet Take 10 mg by mouth daily.  Marland Kitchen aspirin EC 81 MG tablet Take 1 tablet (81 mg total) by mouth at bedtime.  Marland Kitchen atorvastatin (LIPITOR) 40 MG tablet TAKE ONE TABLET BY MOUTH IN THE EVENING  . Blood Glucose Monitoring Suppl (ACCU-CHEK NANO SMARTVIEW) W/DEVICE KIT Use to check blood sugar 3 times per day dx code E11.65  . carbidopa-levodopa (SINEMET IR) 25-100 MG per tablet Take 1 tablet by mouth 3 (three) times daily.   . carvedilol (COREG) 12.5 MG tablet Take 1 tablet (12.5 mg total) by mouth 2 (two) times daily.  . cholecalciferol (VITAMIN D) 400 units TABS tablet Take 400 Units by mouth.  . furosemide (LASIX) 40 MG tablet Take 40 mg twice daily on M/W/F and 40 mg once daily all other days  . glucose blood (ACCU-CHEK SMARTVIEW) test strip USE TO CHECK BLOOD SUGAR THREE TIMES DAILY. DX: E11.65  . insulin aspart (NOVOLOG FLEXPEN) 100 UNIT/ML FlexPen Inject 6 units before breakfast and lunch and 10 units before dinner  . Insulin Pen Needle 31G X 8 MM MISC Use to inject medication 4 times daily  .  lamoTRIgine (LAMICTAL) 150 MG tablet Take 150 mg by mouth daily.   . Liraglutide (VICTOZA) 18 MG/3ML SOPN Inject 1.8 daily  . meloxicam (MOBIC) 15 MG tablet Take 7.5 mg by mouth daily.   Marland Kitchen olmesartan (BENICAR) 40 MG tablet Take 40 mg by mouth daily.  . Pancrelipase, Lip-Prot-Amyl, 25000 UNITS CPEP Take 5 capsules by mouth 3 (three) times daily. 2 caps at breakfast, 1 at lunch, 2 at dinner  . potassium chloride SA (K-DUR,KLOR-CON) 20 MEQ tablet Take 20 mEq by mouth daily.    . pregabalin (LYRICA) 50 MG capsule Take 50 mg by mouth 2 (two) times daily.  . primidone (MYSOLINE) 50 MG tablet Take 50 mg  by mouth at bedtime.   . sertraline (ZOLOFT) 100 MG tablet Take 50 mg by mouth daily.   . TOUJEO SOLOSTAR 300 UNIT/ML SOPN INJECT 40 UNITS SUBCUTANEOUSLY ONCE DAILY  . cholestyramine (QUESTRAN) 4 G packet Take 4 g by mouth 2 (two) times daily as needed.  . isosorbide mononitrate (IMDUR) 30 MG 24 hr tablet Take 30 mg by mouth daily.   Marland Kitchen omega-3 acid ethyl esters (LOVAZA) 1 G capsule Take 1 g by mouth daily.   No facility-administered encounter medications on file as of 04/12/2016.     Functional Status:   In your present state of health, do you have any difficulty performing the following activities: 04/12/2016  Hearing? N  Vision? Y  Difficulty concentrating or making decisions? N  Walking or climbing stairs? Y  Dressing or bathing? N  Doing errands, shopping? Y  Preparing Food and eating ? N  Using the Toilet? N  In the past six months, have you accidently leaked urine? Y  Do you have problems with loss of bowel control? N  Managing your Medications? N  Managing your Finances? N  Housekeeping or managing your Housekeeping? N  Some recent data might be hidden    Fall/Depression Screening:    PHQ 2/9 Scores 03/29/2016 06/02/2015 05/13/2015  PHQ - 2 Score 1 0 0   Fall Risk  03/29/2016 06/02/2015 05/13/2015  Falls in the past year? No No No  Risk for fall due to : Impaired vision;Impaired balance/gait - -    Assessment:    Met with member at scheduled time.  [facility] care management services again explained, consent obtained.  As member reported, her weight and diabetes management are her biggest concerns.  Discussed the connection of stress and management of blood sugars.  She verbalizes understanding.  She denies having any grief counseling since her nephew's passing in May, but state that she is aware that her blood sugars have been out of control since then.  She discusses losing several family members suddenly over the years, and state that she still has difficulty processing the loss  and dealing with the grief.  She state that she has been on medications for depression and anxiety in the past, and that she has been seen at [facility] in the past.  She did seek counseling after her parents passes away, but still feel she has a had time coping, particularly near the holidays.  She does not know if she is ready for more counseling at this time, but she is willing to accept resources.    Discussed with member the importance of follow up with endocrinologist as they are the best ones to manage uncontrolled diabetes.  She verbalizes understanding, but also again expresses concern regarding finances.  She state she would like to see if her PCP  is willing to manage her diabetes.  She is made aware that she may ask the question, however the endocrinologist remains the most appropriate person for management.  She state that she will have to wait until next month to schedule the appointment, but will only be able to do it if she has extra money.  She is also concerned about transportation because her sister usually take her to appointments (she is no longer able to drive due to seizure disorder).  However, her sister has recently had back surgery and is unable to drive.  She is made aware that the social worker can provide assistance for alternative transportation until her sister is back on her feet, and ongoing as a back up.  She is also made aware to [facility] transportation that is available.  Medications reviewed with member.  She denies any concerns regarding meds, but some of the medications she has does not match her medication list or are duplicates of the same medication class.  She has been taking Benicar 40 mg and Losartan 100 mg.  Med list has Benicar listed to take.  Also, she has been taking Metoprolol 50 mg and Carvedilol 12.5 mg, med list has Carvedilol as active.  She has Isosorbide listed as active, but does not have it in the home.  Pharmacy referral has already been placed.  She is made  aware that this care manager will follow up with them.  Member state that her weight is a concern.  She report that she was more active, going to the gym walking and doing water exercises, prior to her nephew's passing and prior to her sister's surgery.  She state that she is wanting to get back into her routine, but does not have the drive right now.  She is again advised to seek grief counseling in order to be able to progress with the management of her chronic conditions.  She verbalizes understanding.  She denies any further questions.  She is provided with this care manager's contact information, advised to contact for concerns.  She is provided with [facility] calendar tool book that includes signs/symptoms of hyper/hypoglycemia & hypertension, and logs for recording blood sugar & blood pressure.   Plan:   Will follow up with pharmacy regarding referral and medication review. Will follow up with PCP and cardiologist regarding medication discrepancy.   Will place referral to LCSW for assistance with community resources.  [facility] CM Care Plan Problem One   Flowsheet Row Most Recent Value  Care Plan Problem One  Risk for hospitalization related to diabetes as evidenced by member's log of uncontrolled blood sugars ranging from 40s-300s  Role Documenting the Problem One  Care Management [place] for Problem One  Active  [facility] Long Term Goal (31-90 days)  Member's blood sugar will not report any hypoglycemic episodes within the next 31 days  [facility] Long Term Goal Start Date  04/12/16  Interventions for Problem One Long Term Goal  Educated member through [place] on importance of effective management of diabetes (including diet, exercise and medications).  Educated on ways to avoid hypoglycemia.   [facility] CM Short Term Goal #1 (0-30 days)  Member will check and record blood sugar 2-3 times a day as instructed over the next 4 weeks  [facility] CM Short Term Goal #1 Start Date  04/12/16  Interventions for  Short Term Goal #1  Discussed the importance of checking blood sugar and how it aides in proper management of diabetes.  Provided  member with [facility] Calendar tool book with logs for recording blood blood sugar  [facility] CM Short Term Goal #2 (0-30 days)  Member will schedule appointment with endocrinologist within the next 4 weeks  [facility] CM Short Term Goal #2 Start Date  04/12/16  Interventions for Short Term Goal #2  Discussed with member the importance follow up appointment as it provides member the resource for management for diabetes     Valente David, Therapist, sports, MSN [place] Manager [phone]

## 2016-04-13 ENCOUNTER — Encounter: Payer: Self-pay | Admitting: *Deleted

## 2016-04-15 ENCOUNTER — Ambulatory Visit (INDEPENDENT_AMBULATORY_CARE_PROVIDER_SITE_OTHER): Payer: Commercial Managed Care - HMO | Admitting: Neurology

## 2016-04-15 ENCOUNTER — Encounter: Payer: Self-pay | Admitting: *Deleted

## 2016-04-15 DIAGNOSIS — R4189 Other symptoms and signs involving cognitive functions and awareness: Secondary | ICD-10-CM

## 2016-04-15 DIAGNOSIS — G4733 Obstructive sleep apnea (adult) (pediatric): Secondary | ICD-10-CM | POA: Diagnosis not present

## 2016-04-15 DIAGNOSIS — E669 Obesity, unspecified: Secondary | ICD-10-CM

## 2016-04-15 DIAGNOSIS — R0683 Snoring: Secondary | ICD-10-CM

## 2016-04-15 DIAGNOSIS — R251 Tremor, unspecified: Secondary | ICD-10-CM

## 2016-04-16 ENCOUNTER — Other Ambulatory Visit: Payer: Self-pay | Admitting: Endocrinology

## 2016-04-17 ENCOUNTER — Other Ambulatory Visit: Payer: Self-pay | Admitting: *Deleted

## 2016-04-17 ENCOUNTER — Ambulatory Visit (INDEPENDENT_AMBULATORY_CARE_PROVIDER_SITE_OTHER): Payer: Commercial Managed Care - HMO | Admitting: Endocrinology

## 2016-04-17 VITALS — BP 138/88 | HR 88 | Temp 97.9°F | Resp 16 | Ht 60.0 in | Wt 283.2 lb

## 2016-04-17 DIAGNOSIS — Z794 Long term (current) use of insulin: Secondary | ICD-10-CM | POA: Diagnosis not present

## 2016-04-17 DIAGNOSIS — E1165 Type 2 diabetes mellitus with hyperglycemia: Secondary | ICD-10-CM

## 2016-04-17 DIAGNOSIS — E1121 Type 2 diabetes mellitus with diabetic nephropathy: Secondary | ICD-10-CM | POA: Diagnosis not present

## 2016-04-17 LAB — BASIC METABOLIC PANEL
BUN: 16 mg/dL (ref 6–23)
CO2: 32 mEq/L (ref 19–32)
Calcium: 9.5 mg/dL (ref 8.4–10.5)
Chloride: 98 mEq/L (ref 96–112)
Creatinine, Ser: 0.88 mg/dL (ref 0.40–1.20)
GFR: 82.17 mL/min (ref >60.00)
Glucose, Bld: 161 mg/dL — ABNORMAL HIGH (ref 70–99)
Potassium: 3.9 mEq/L (ref 3.5–5.1)
Sodium: 137 mEq/L (ref 135–145)

## 2016-04-17 LAB — URINALYSIS, ROUTINE W REFLEX MICROSCOPIC
Bilirubin Urine: NEGATIVE
Hgb urine dipstick: NEGATIVE
Ketones, ur: NEGATIVE
Leukocytes, UA: NEGATIVE
Nitrite: NEGATIVE
RBC / HPF: NONE SEEN (ref 0–?)
Specific Gravity, Urine: <=1.005 — AB (ref 1.000–1.030)
Total Protein, Urine: NEGATIVE
Urine Glucose: NEGATIVE
Urobilinogen, UA: 0.2 (ref 0.0–1.0)
pH: 6.5 (ref 5.0–8.0)

## 2016-04-17 LAB — POCT GLYCOSYLATED HEMOGLOBIN (HGB A1C): Hemoglobin A1C: 8.6

## 2016-04-17 LAB — MICROALBUMIN / CREATININE URINE RATIO
Creatinine,U: 18 mg/dL
Microalb Creat Ratio: 3.9 mg/g (ref 0.0–30.0)
Microalb, Ur: <0.7 mg/dL (ref 0.0–1.9)

## 2016-04-17 MED ORDER — INSULIN ASPART 100 UNIT/ML FLEXPEN: PEN_INJECTOR | SUBCUTANEOUS | 3 refills | Status: DC

## 2016-04-17 MED ORDER — EMPAGLIFLOZIN 10 MG PO TABS: 10.0000 mg | ORAL_TABLET | Freq: Every day | ORAL | 3 refills | Status: DC

## 2016-04-17 MED ORDER — INSULIN GLARGINE 300 UNIT/ML ~~LOC~~ SOPN: 40.0000 [IU] | PEN_INJECTOR | Freq: Every day | SUBCUTANEOUS | 1 refills | Status: DC

## 2016-04-17 NOTE — Patient Outreach (Signed)
[facility]) Care Management  04/17/2016  SOSIE LADE Apr 26, 1946 [id number]   Request received from Nat Christen, LCSW to mail patient mental health resources, grief counseling resources, transportation resources. Information mailed today, 04/17/16  [name], [place] Management Assistant

## 2016-04-17 NOTE — Patient Instructions (Signed)
Toujeo 36 units and Humalog 10 at supper  Check blood sugars on waking up    Also check blood sugars about 2 hours after a meal and do this after different meals by rotation  Recommended blood sugar levels on waking up is 90-130 and about 2 hours after meal is 130-160  Please bring your blood sugar monitor to each visit, thank you  Bedtime snack if sugar <120  Jardiance in am  Lasix 1 pill 3x a week and 1/2 on other days

## 2016-04-17 NOTE — Progress Notes (Signed)
Patient ID: [name], female   DOB: 07/12/1945, 70 y.o.   MRN: [id number]   Reason for Appointment: Diabetes follow-up   History of Present Illness   Diagnosis: Type 2 DIABETES MELITUS, date of diagnosis: 2008       PAST history: She has been on insulin for a few years Her blood sugars had initially responded very well to adding Victoza to her basal insulin in 2/13. She had lost weight and was able to comply with diet consistently. A1c had gone down to 7.2%. Also was needing less Levemir insulin .  However blood sugars will be higher if she goes off her diet  With good compliance her A1c was near normal at 6.3 in 5/14  RECENT HISTORY:    Insulin regimen: Toujeo 38 units hs; Humalog 4/6--6-12 before meals       She has again been noncompliant with her follow-up A1c is higher at 8.6, previously as low as 7.2  Current management, blood sugar patterns and problems identified:  Her monitor could not be downloaded today as her battery was not functioning.  She cannot recall her blood sugar patterns and not clear when her blood sugars are higher  She is more concerned about occasional episodes of low sugars, has only occasional low readings in the 60s recently either before supper or around 1 AM   She thinks with taking Toujeo in the morning her fasting readings are higher and she is taking this at bedtime  Her weight has gone up significantly despite taking Victoza.  She is not doing any significant formal exercise, previously was going to the [facility] now doing only some walking outside or in place.  She said that occasionally her sugars will be higher either at breakfast or lunch time but fasting readings are not consistently high  Hypoglycemia: mostly ac or hs, 1 twice a month  She has been advised on the diet previously. She was seen by nurse educator in early 2015 and advised to have balanced meals with more protein as well avoid all regular soft drinks  Oral  hypoglycemic drugs: None   Side effects from medications:  diarrhea from metformin,?  UTI from Invokana 300 mg            Proper timing of medications in relation to meals: Yes.    Monitors blood glucose: once or twice a day.    Glucometer:  Accu-Chek  Monitor not downloaded today  Meals: 3 meals per day. Supper 7 pm, Lunch is sandwich at 1-2 pm Bfst: grits/egg or biscuit Physical activity: walking some, 20 min          Dietician visit: Most recent: 05/2015.   Wt Readings from Last 3 Encounters:  04/17/16 283 lb 3.2 oz (128.5 kg)  04/12/16 281 lb (127.5 kg)  04/02/16 281 lb (127.5 kg)   Lab Results  Component Value Date   HGBA1C 8.6 04/17/2016   HGBA1C 7.6 (H) 09/30/2015   HGBA1C 7.2 07/07/2015   Lab Results  Component Value Date   MICROALBUR <0.7 07/01/2015   LDLCALC 59 09/30/2015   CREATININE 0.97 09/30/2015       Medication List       Accurate as of 04/17/16 10:28 AM. Always use your most recent med list.          ACCU-CHEK FASTCLIX LANCETS Misc USE  TO CHECK BLOOD SUGAR THREE TIMES DAILY   ACCU-CHEK NANO SMARTVIEW w/Device Kit Use to check blood sugar 3 times per day  dx code E11.65   ACCU-CHEK SMARTVIEW test strip Generic drug:  glucose blood CHECK BLOOD SUGAR THREE TIMES DAILY   ARIPiprazole 10 MG tablet Commonly known as:  ABILIFY Take 10 mg by mouth daily.   aspirin EC 81 MG tablet Take 1 tablet (81 mg total) by mouth at bedtime.   atorvastatin 40 MG tablet Commonly known as:  LIPITOR TAKE ONE TABLET BY MOUTH IN THE EVENING   B-D SINGLE USE SWABS REGULAR Pads Use as directed   carbidopa-levodopa 25-100 MG tablet Commonly known as:  SINEMET IR Take 1 tablet by mouth 3 (three) times daily.   carvedilol 12.5 MG tablet Commonly known as:  COREG Take 1 tablet (12.5 mg total) by mouth 2 (two) times daily.   cholecalciferol 400 units Tabs tablet Commonly known as:  VITAMIN D Take 400 Units by mouth.   cholestyramine 4 g packet Commonly  known as:  QUESTRAN Take 4 g by mouth 2 (two) times daily as needed.   empagliflozin 10 MG Tabs tablet Commonly known as:  JARDIANCE Take 10 mg by mouth daily with breakfast.   furosemide 40 MG tablet Commonly known as:  LASIX Take 40 mg twice daily on M/W/F and 40 mg once daily all other days   insulin aspart 100 UNIT/ML FlexPen Commonly known as:  NOVOLOG FLEXPEN Inject4- 6 units before breakfast and lunch and 10-12 units before dinner   Insulin Glargine 300 UNIT/ML Sopn Commonly known as:  TOUJEO SOLOSTAR Inject 40 Units into the skin daily.   Insulin Pen Needle 31G X 8 MM Misc Use to inject medication 4 times daily   isosorbide mononitrate 30 MG 24 hr tablet Commonly known as:  IMDUR Take 30 mg by mouth daily.   lamoTRIgine 150 MG tablet Commonly known as:  LAMICTAL Take 150 mg by mouth daily.   liraglutide 18 MG/3ML Sopn Commonly known as:  VICTOZA Inject 1.8 daily   meloxicam 15 MG tablet Commonly known as:  MOBIC Take 7.5 mg by mouth daily.   olmesartan 40 MG tablet Commonly known as:  BENICAR Take 40 mg by mouth daily.   omega-3 acid ethyl esters 1 g capsule Commonly known as:  LOVAZA Take 1 g by mouth daily.   Pancrelipase (Lip-Prot-Amyl) 25000 units Cpep Take 5 capsules by mouth 3 (three) times daily. 2 caps at breakfast, 1 at lunch, 2 at dinner   potassium chloride SA 20 MEQ tablet Commonly known as:  K-DUR,KLOR-CON Take 20 mEq by mouth daily.   pregabalin 50 MG capsule Commonly known as:  LYRICA Take 50 mg by mouth 2 (two) times daily.   primidone 50 MG tablet Commonly known as:  MYSOLINE Take 50 mg by mouth at bedtime.   sertraline 100 MG tablet Commonly known as:  ZOLOFT Take 50 mg by mouth daily.       Allergies:  Allergies  Allergen Reactions  . Codeine     Headache and "just feel off"  . Penicillins Hives  . Sulfa Antibiotics Hives  . Other Rash    Bleach    Past Medical History:  Diagnosis Date  . Abnormal liver  function     in the past.  . Anemia   . Arthritis    all over  . Bruises easily   . Cataract    right eye;immature  . Chronic back pain    stenosis  . Chronic cough   . Demand myocardial infarction 2012   Demand Infarction in setting of Pancreatitis --> mild  Troponin elevation, Non-obstructive CAD  . Depression    takes Abilify daily as well as Zoloft  . Diastolic heart failure 6712   Grade 1 diastolic Dysfunction by Echo   . Diverticulosis   . DM (diabetes mellitus) ([place])    takes Victoza daily as well as Lantus and Humalog  . Empty sella ([place])    on MRI in 2009.  . Glaucoma   . Headache(784.0)    last migraine-4-25yr ago  . History of blood transfusion    no abnormal reaction noted  . History of colon polyps    benign  . History of influenza    antibiotic given yesterday along with taking Delsym cough syrup  . HTN (hypertension)    takes BKinder Morgan Energydaily  . Hyperlipidemia    takes Lipitor daily  . Joint swelling   . Nocturia   . Obstructive sleep apnea   . Pancreatitis    takes Pancrelipase daily  . Parkinson's disease ([place]    takes Sinemet daily  . Peripheral edema    takes Lasix daily  . Peripheral neuropathy ([place]   . Pneumonia 2012  . Seizure disorder ([place]   . Seizures ([place]    takes Lamictal daily and Primidone nightly;last seizure 2wks ago  . Urinary frequency   . Urinary urgency     Past Surgical History:  Procedure Laterality Date  . ABDOMINAL HYSTERECTOMY    . APPENDECTOMY    . BACK SURGERY    . CARDIAC CATHETERIZATION  2009/March 2012   2009: (Dr. KAlvie He[place] Nonobstructive CAD; 2012: Minimal CAD --> false positive stress test  . CHOLECYSTECTOMY    . COLONOSCOPY N/A 08/15/2012   Procedure: COLONOSCOPY;  Surgeon: WArta Silence MD;  Location: [facility] ENDOSCOPY;  Service: Endoscopy;  Laterality: N/A;  . ESOPHAGOGASTRODUODENOSCOPY    . eye cysts Bilateral   . LASIK    . NM MYOVIEW LTD  March 2012; Aug 2017   Probably normal  Lexiscan Myoview. Mild reversible Anterior Defect ~ Breast Attenuation. (CRO ischemia):  FALSE POSITIVE by Cath; b) 8/'17: LOW RISK, No ischemia or Infarction.  Breast Attenuation not noted.  .Marland KitchenRECTAL POLYPECTOMY    . TONSILLECTOMY    . TRANSTHORACIC ECHOCARDIOGRAM  01/2016   Normal LV chamber size with moderate LVH pattern. EF 50-55%. Severe LA dilation. Moderate RA dilation. PA pressure elevated at 38 mmHg (mild as (  . TUBAL LIGATION    . vaginal cyst removed     several times    Family History  Problem Relation Age of Onset  . Allergies Father   . Heart disease Father     before 666 . Heart failure Father   . Hypertension Father   . Hyperlipidemia Father   . Heart disease Mother   . Diabetes Mother   . Hypertension Mother   . Hyperlipidemia Mother   . Hypertension Sister   . Heart disease Sister     before 633 . Hyperlipidemia Sister   . Diabetes Brother   . Hypertension Brother   . Diabetes Sister   . Hypertension Sister   . Diabetes Son   . Hypertension Son   . Cancer Other     Social History:  reports that she has quit smoking. Her smoking use included Cigarettes. She has a 12.50 pack-year smoking history. She has never used smokeless tobacco. She reports that she does not drink alcohol or use drugs.  ROS:   Hypertension: currently on Carvedilol, Benicar and Lasix.  Does not  monitor at home  BP Readings from Last 3 Encounters:  04/17/16 138/88  04/12/16 136/82  04/02/16 (!) 160/102    Hypercholesterolemia: On Lipitor   Lab Results  Component Value Date   CHOL 135 09/30/2015   HDL 62.50 09/30/2015   LDLCALC 59 09/30/2015   LDLDIRECT 66.0 01/27/2015   TRIG 71.0 09/30/2015   CHOLHDL 2 09/30/2015   Foot exam done in 5/15, No recent records available from PCP regarding last exam  Edema treated with Lasix 40 mg, Taking 10 tablets a week  She continues to be on multiple psychotropic drugs including Lamictal and Zoloft  No recent problems with urinary  retention, excessive frequency but does have some urgency   Examination:   BP 138/88   Pulse 88   Temp 97.9 F (36.6 C)   Resp 16   Ht 5' (1.524 m)   Wt 283 lb 3.2 oz (128.5 kg)   SpO2 97%   BMI 55.31 kg/m   Body mass index is 55.31 kg/m.    Assesment:   Diabetes type 2, uncontrolled    See history of present illness for detailed discussion of current blood sugar patterns, problems identified and current management  She is having poor control with A1c 8.6 Unable to download monitor for review, difficult to know when she is having higher readings and for what reason Has also gained weight, nearly 20 pound since her last visit Does not appear to be benefiting much from [place] although this is probably limiting her mealtime insulin dose Although she had previously reported UTI from [place] this was a couple of years ago and she would benefit from her drug in this category, also previously had taken the higher dose of Invokana  HYPERTENSION: Her blood pressure is high normal and occasionally high also, followed by PCP and cardiologist    PLAN:   She will try Jardiance 10 mg daily  With this she will reduce her Toujeo by 4 units and suppertime NovoLog by 2 units for now  Baseline labs today per  She will reduce her Lasix 50% when starting Jardiance and call if she is having any edema  Follow-up in 1 month for review of blood sugar patterns taking her today in the morning and reduce the dose by 2 units  More blood sugars after evening meals which she is not doing  Consider consultation with dietitian again   Patient Instructions  Toujeo 36 units and Humalog 10 at supper  Check blood sugars on waking up    Also check blood sugars about 2 hours after a meal and do this after different meals by rotation  Recommended blood sugar levels on waking up is 90-130 and about 2 hours after meal is 130-160  Please bring your blood sugar monitor to each visit, thank  you  Bedtime snack if sugar <120  Jardiance in am  Lasix 1 pill 3x a week and 1/2 on other days      Counseling time on subjects discussed above is over 50% of today's 25 minute visit     Arla Boutwell 04/17/2016, 10:28 AM

## 2016-04-20 ENCOUNTER — Other Ambulatory Visit: Payer: Self-pay | Admitting: *Deleted

## 2016-04-20 ENCOUNTER — Telehealth: Payer: Self-pay | Admitting: Cardiology

## 2016-04-20 MED ORDER — ISOSORBIDE MONONITRATE ER 30 MG PO TB24: 30.0000 mg | ORAL_TABLET | Freq: Every day | ORAL | 4 refills | Status: DC

## 2016-04-20 NOTE — Telephone Encounter (Signed)
New message  Valente David from [place] [facility] call requesting to speak with RN about pts medications. Please call back to discuss

## 2016-04-20 NOTE — Telephone Encounter (Signed)
Spoke with Brayton Layman she states that she was going over medications with pt  And found a few that she had questions on went over medications on med list from last visit and she states that pt needs refill on Imdur. Will send refill to pharmacy listed.

## 2016-04-20 NOTE — Patient Outreach (Signed)
[facility]) Care Management  04/20/2016  [name] 1945-11-09 [id number]   Clarification of medications received from Dr. Allison Quarry office.  Call placed to member with clarifications.  She is to take Benicar instead of Losartan, Carvedilol instead of Metoprolol, and Imdur.  Office will call prescription for Imdur to pharmacy, member aware.  She report that she did have follow up with endocrinologist, placed on Jardiance as new med.  She denies any concerns, no other changes.  Routine home visit scheduled for next month.  Valente David, [place], MSN [place] [phone]

## 2016-04-22 ENCOUNTER — Other Ambulatory Visit: Payer: Self-pay | Admitting: Cardiology

## 2016-04-22 DIAGNOSIS — R0609 Other forms of dyspnea: Principal | ICD-10-CM

## 2016-04-22 DIAGNOSIS — I2510 Atherosclerotic heart disease of native coronary artery without angina pectoris: Secondary | ICD-10-CM

## 2016-04-23 ENCOUNTER — Telehealth: Payer: Self-pay

## 2016-04-23 DIAGNOSIS — G4733 Obstructive sleep apnea (adult) (pediatric): Secondary | ICD-10-CM

## 2016-04-23 NOTE — Telephone Encounter (Signed)
I called pt to discuss sleep study results. No answer, left a message asking her to call me back. 

## 2016-04-24 NOTE — Telephone Encounter (Signed)
I spoke to pt. I advised her that her sleep study revealed severe sleep apnea with prolonged hypoxemia. Sleep apnea improved while using cpap and supplemental oxygen.  I advised pt to lose weight, diet, and exercise if not contraindicated by her other physicians. Pt is agreeable to starting cpap with supplemental oxygen. I advised pt that a DME will be calling her to set up her cpap. Will send to [facility] since pt has [employer]. A follow up appt was made for 07/19/16 at 11:00am.  Pt verbalized understanding of results. Pt had no questions at this time but was encouraged to call back if questions arise.  Order for cpap 9 cm H2O not placed- will send to Dr. Brett Fairy for review. Please address this order and place a specific O2 amount per liter to be bled into CPAP. (Sleep study says oxygen therapy at 2-4 lpm was necessary as hypoxemia did not respond to CPAP alone.)  Copy of sleep study faxed to Dr. Baird Cancer.

## 2016-04-25 ENCOUNTER — Other Ambulatory Visit: Payer: Self-pay | Admitting: *Deleted

## 2016-04-25 NOTE — Telephone Encounter (Signed)
Referral sent to [facility] 

## 2016-04-25 NOTE — Patient Outreach (Signed)
[facility]) Care Management  04/25/2016  RAJEAN URWIN 1946-02-01 [id number]   CSW received a new referral on patient from patient's RNCM with [facility] [facility], Valente David, indicating that patient would benefit from social work services and resources to assist with referrals to various community agencies.  More specifically, [name] reported that patient needs assistance applying for alternative transportation, as patient currently relies on her sister, who is recovering from back surgery.  Patient also admitted to [name] that she is interested in receiving resources for grief counseling due to the sudden death of various family members over the last few years.  The latest death being her nephew who died suddenly in patient's bathroom.  Patient verbalized to [name] that she has never dealt with the losses; therefore, unable to properly manage her own health concerns, as a result of the stress. CSW made an initial attempt to try and contact patient today to perform phone assessment, as well as assess and assist with social needs and services, without success.  A HIPAA complaint message was left for patient on voicemail.  CSW is currently awaiting a return call.  CSW will make a second outreach attempt in one week, if CSW does not receive a return call from patient in the meantime. Nat Christen, BSW, MSW, LCSW  Licensed Education officer, environmental Health System  Mailing [place] [address] [place], [place] [address] Physical Address-[address], [place], [place] [address] Toll Free Main # [phone] Fax # [phone] Cell # [phone]  Fax # [phone]  [email or web address]

## 2016-05-02 ENCOUNTER — Ambulatory Visit: Payer: Commercial Managed Care - HMO | Admitting: *Deleted

## 2016-05-02 ENCOUNTER — Encounter: Payer: Self-pay | Admitting: *Deleted

## 2016-05-02 ENCOUNTER — Other Ambulatory Visit: Payer: Self-pay | Admitting: *Deleted

## 2016-05-02 NOTE — Patient Outreach (Signed)
[facility]) Care Management  05/02/2016  [name] 05/09/1946 [id number]   CSW was able to make initial contact with patient today to perform phone assessment, as well as assess and assist with social work needs and services.  CSW introduced self, explained role and types of services provided through [place] [facility] ([place] [facility]).  CSW further explained to patient that CSW works with patient's RNCM, also with [facility] [facility], Valente David. CSW then explained the reason for the call, indicating that [name] thought that patient would benefit from social work services and resources to assist with obtaining transportation to and from physician appointments, as well as referrals to grief and loss counseling for symptoms of depression related to recent sudden death of nephew.  CSW obtained two HIPAA compliant identifiers from patient, which included patient's name and date of birth. Patient reported that she is no longer in need of transportation assistance, as her sister, [name] is no longer restricted, due to recent back surgery, and is able to transport her.  CSW spoke with patient about transportation services through [place], a benefit of her [facility] Medicare coverage, which patient was not aware of.  CSW provided patient with the contact information, explaining that patient is entitled to 53 free rides per fiscal year.  Patient voiced understanding and agreed to use [employer] as a back-up plan, in the event that her sister is unable to transport her. CSW also spoke with patient about grief and loss counseling, explaining that patient is able to obtain free or reduced cost counseling services through [place], as well as [facility] [place].  CSW agreed to mail patient a packet of resource information, including a list of [employer] approved therapists in the [place] area.  Patient spoke  briefly about the sudden recent death of her nephew, which took place in the house where patient is currently residing.  CSW offered counseling and supportive services, where appropriate. CSW will perform a case closure on patient, as all goals of treatment have been met from social work standpoint and no additional social work needs have been identified at this time.  CSW will notify patient's RNCM with [facility], Valente David of CSW's plans to close patient's case.  CSW will fax an update to patient's Primary Care Physician, Dr. [name] to ensure that they are aware of CSW's involvement with patient's plan of care.  CSW will submit a case closure request to [name], Care Management Assistant with [facility], in the form of an [employer].  CSW will ensure that [name] is aware of [name], RNCM with [facility], continued involvement with patient's care. Nat Christen, BSW, MSW, LCSW  Licensed Education officer, environmental Health System  Mailing [place] [address], [place], [place] [address] Physical Address-[address], [place], [place] [address] Toll Free Main # [phone] Fax # [phone] Cell # [phone]  Fax # [phone]  [email or web address]

## 2016-05-04 ENCOUNTER — Telehealth: Payer: Self-pay | Admitting: Endocrinology

## 2016-05-04 DIAGNOSIS — D649 Anemia, unspecified: Secondary | ICD-10-CM | POA: Diagnosis not present

## 2016-05-04 DIAGNOSIS — N08 Glomerular disorders in diseases classified elsewhere: Secondary | ICD-10-CM | POA: Diagnosis not present

## 2016-05-04 DIAGNOSIS — Z0000 Encounter for general adult medical examination without abnormal findings: Secondary | ICD-10-CM | POA: Diagnosis not present

## 2016-05-04 DIAGNOSIS — N182 Chronic kidney disease, stage 2 (mild): Secondary | ICD-10-CM | POA: Diagnosis not present

## 2016-05-04 DIAGNOSIS — I129 Hypertensive chronic kidney disease with stage 1 through stage 4 chronic kidney disease, or unspecified chronic kidney disease: Secondary | ICD-10-CM | POA: Diagnosis not present

## 2016-05-04 DIAGNOSIS — E1122 Type 2 diabetes mellitus with diabetic chronic kidney disease: Secondary | ICD-10-CM | POA: Diagnosis not present

## 2016-05-04 NOTE — Telephone Encounter (Signed)
Left a voice mail ordered new meter and asked for a call back if she had any questions she could call back

## 2016-05-04 NOTE — Telephone Encounter (Signed)
We can call in the prescription for another Accu-Chek meter similar to what she has, she needs to make sure it was not a battery problem

## 2016-05-04 NOTE — Telephone Encounter (Signed)
Pt called and said that her Accu Check Machine is not working anymore, she wants to know if we have another one she can have.

## 2016-05-09 NOTE — Patient Outreach (Signed)
[facility]) Care Management  05/09/2016  [name] 10-Dec-1945 [id number]   Request received from Nat Christen, LCSW to mail patient transportation resources as well as grief counseling resources. Information mailed today, 05/09/16  [name], [place] Management Assistant

## 2016-05-10 ENCOUNTER — Other Ambulatory Visit: Payer: Self-pay | Admitting: *Deleted

## 2016-05-10 NOTE — Patient Outreach (Addendum)
[facility]) Care Management   05/10/2016  [name] 12-18-1945 [id number]  [name] is an 70 y.o. female  Subjective:   Member reports that she is "making it."  She complains of ankle pain rating 9/10.  She state she has taken Acetaminophen but has not had any relief.  She state she also take Lyrica as scheduled, but report that she still has little to no relief.  Member state that she appointment with her primary MD and weight loss was discussed.  She report she has increased her exercise and changed her diet, but a barrier to exercise is the pain.  She denies ever seeing a pain specialist.  She is advised to contact her MD to discuss this barrier.    She report that she has not checked her blood sugar in a week due to her meter not working.  She state that her MD is aware and a new one will be delivered within the next few days from [facility].   Objective:   Review of Systems  Constitutional: Negative.   HENT: Negative.   Eyes: Negative.   Respiratory: Negative.   Cardiovascular: Negative.   Gastrointestinal: Negative.   Genitourinary: Negative.   Musculoskeletal: Positive for back pain.       Ankle pain  Skin: Negative.   Neurological: Negative.   Endo/Heme/Allergies: Negative.   Psychiatric/Behavioral: Negative.     Physical Exam  Constitutional: She is oriented to person, place, and time. She appears well-developed and well-nourished.  Neck: Normal range of motion.  Cardiovascular: Normal rate, regular rhythm and normal heart sounds.   Respiratory: Effort normal and breath sounds normal.  GI: Soft. Bowel sounds are normal.  Musculoskeletal: Normal range of motion.  Neurological: She is alert and oriented to person, place, and time.  Skin: Skin is warm and dry.   BP 132/78   Pulse 72   Resp 18   SpO2 96%   Encounter Medications:   Outpatient Encounter Prescriptions as of 05/10/2016  Medication Sig  . ACCU-CHEK FASTCLIX LANCETS MISC  USE  TO CHECK BLOOD SUGAR THREE TIMES DAILY  . ACCU-CHEK SMARTVIEW test strip CHECK BLOOD SUGAR THREE TIMES DAILY  . Alcohol Swabs (B-D SINGLE USE SWABS REGULAR) PADS Use as directed  . ARIPiprazole (ABILIFY) 10 MG tablet Take 10 mg by mouth daily.  Mar[place] Kitchen aspirin EC 81 MG tablet Take 1 tablet (81 mg total) by mouth at bedtime.  Marland Kitchen atorvastatin (LIPITOR) 40 MG tablet TAKE ONE TABLET BY MOUTH IN THE EVENING  . Blood Glucose Monitoring Suppl (ACCU-CHEK NANO SMARTVIEW) W/DEVICE KIT Use to check blood sugar 3 times per day dx code E11.65  . carbidopa-levodopa (SINEMET IR) 25-100 MG per tablet Take 1 tablet by mouth 3 (three) times daily.   . carvedilol (COREG) 12.5 MG tablet Take 1 tablet (12.5 mg total) by mouth 2 (two) times daily.  . cholecalciferol (VITAMIN D) 400 units TABS tablet Take 400 Units by mouth.  . cholestyramine (QUESTRAN) 4 G packet Take 4 g by mouth 2 (two) times daily as needed.  . empagliflozin (JARDIANCE) 10 MG TABS tablet Take 10 mg by mouth daily with breakfast.  . furosemide (LASIX) 40 MG tablet Take 40 mg twice daily on M/W/F and 40 mg once daily all other days  . insulin aspart (NOVOLOG FLEXPEN) 100 UNIT/ML FlexPen Inject4- 6 units before breakfast and lunch and 10-12 units before dinner  . Insulin Glargine (TOUJEO SOLOSTAR) 300 UNIT/ML SOPN Inject 40 Units into the  skin daily.  . Insulin Pen Needle 31G X 8 MM MISC Use to inject medication 4 times daily  . isosorbide mononitrate (IMDUR) 30 MG 24 hr tablet Take 1 tablet (30 mg total) by mouth daily.  Marland Kitchen lamoTRIgine (LAMICTAL) 150 MG tablet Take 150 mg by mouth daily.   . Liraglutide (VICTOZA) 18 MG/3ML SOPN Inject 1.8 daily  . meloxicam (MOBIC) 15 MG tablet Take 7.5 mg by mouth daily.   Marland Kitchen olmesartan (BENICAR) 40 MG tablet Take 40 mg by mouth daily.  Marland Kitchen omega-3 acid ethyl esters (LOVAZA) 1 G capsule Take 1 g by mouth daily.  . Pancrelipase, Lip-Prot-Amyl, 25000 UNITS CPEP Take 5 capsules by mouth 3 (three) times daily. 2 caps at  breakfast, 1 at lunch, 2 at dinner  . potassium chloride SA (K-DUR,KLOR-CON) 20 MEQ tablet Take 20 mEq by mouth daily.    . pregabalin (LYRICA) 50 MG capsule Take 50 mg by mouth 2 (two) times daily.  . primidone (MYSOLINE) 50 MG tablet Take 50 mg by mouth at bedtime.   . sertraline (ZOLOFT) 100 MG tablet Take 50 mg by mouth daily.    No facility-administered encounter medications on file as of 05/10/2016.     Functional Status:   In your present state of health, do you have any difficulty performing the following activities: 05/02/2016 04/12/2016  Hearing? N N  Vision? Y Y  Difficulty concentrating or making decisions? N N  Walking or climbing stairs? Y Y  Dressing or bathing? N N  Doing errands, shopping? [name]  Preparing Food and eating ? N N  Using the Toilet? N N  In the past six months, have you accidently leaked urine? Y Y  Do you have problems with loss of bowel control? N N  Managing your Medications? N N  Managing your Finances? N N  Housekeeping or managing your Housekeeping? Y N  Some recent data might be hidden    Fall/Depression Screening:    PHQ 2/9 Scores 05/02/2016 03/29/2016 06/02/2015 05/13/2015  PHQ - 2 Score 1 1 0 0    Assessment:    Met with member at scheduled time.  She does not speak of her recent loss today, stating that she feel she is doing better with her grief and depression.  She denies receiving packet from social worker in the mail yet, but state she will keep an eye out for it.  Glucose log reviewed, blood sugars more controlled since last visit.  Blood sugars range from 80-190s, with only 2 readings greater than 200.  She report she has been taking her medications as prescribed, including newly prescribed Jardiance.  Denies questions regarding medication.  This care manager inquired about member's diet, she state that she has decreased her sugar intake and portion control.  She is encouraged to continue as well as address pain with primary MD in order  to increase activity.    Discussed with member the need for CPAP machine according to notes.  She state she has not obtained it due to financial as well as personal concerns.  Importance of using CPAP to manage sleep apnea discussed, as well as complications of not using it.  She state she will discuss financial concern with son in hopes of getting assistance paying for it.  Member denies any further concerns, advised to contact with questions.  Plan:   Will follow up next month for routine home visit.   [facility] CM Care Plan Problem One   Flowsheet Row Most  Recent Value  Care Plan Problem One  Risk for hospitalization related to diabetes as evidenced by member's log of uncontrolled blood sugars ranging from 40s-300s  Role Documenting the Problem One  Care Management [place] for Problem One  Not Active  THN Long Term Goal (31-90 days)  Member's blood sugar will not report any hypoglycemic episodes within the next 31 days  THN Long Term Goal Start Date  04/12/16  [facility] Long Term Goal Met Date  05/10/16  Interventions for Problem One Long Term Goal  Educated member through [place] on importance of effective management of diabetes (including diet, exercise and medications).  Educated on ways to avoid hypoglycemia.   THN CM Short Term Goal #1 (0-30 days)  Member will check and record blood sugar 2-3 times a day as instructed over the next 4 weeks  THN CM Short Term Goal #1 Start Date  04/12/16  [facility] CM Short Term Goal #1 Met Date  05/10/16  Interventions for Short Term Goal #1  Discussed the importance of checking blood sugar and how it aides in proper management of diabetes.  Provided member with [facility] Calendar tool book with logs for recording blood blood sugar  THN CM Short Term Goal #2 (0-30 days)  Member will schedule appointment with endocrinologist within the next 4 weeks  THN CM Short Term Goal #2 Start Date  04/12/16  [facility] CM Short Term Goal #2 Met Date  05/10/16  Interventions  for Short Term Goal #2  Discussed with member the importance follow up appointment as it provides member the resource for management for diabetes    [facility] CM Care Plan Problem Two   Flowsheet Row Most Recent Value  Care Plan Problem Two  Knowledge deficit of diabetes management related to weight management as evidenced by member's stated concern  Role Documenting the Problem Two  Care Management [place] for Problem Two  Active  Interventions for Problem Two Long Term Goal   Edcuated member using teachback about importane of weight management as it relates to management of diabetes.  Discussed ways to decrease weight such as diet and exercise.  THN Long Term Goal (31-90) days  Member will have a decrease in weight by 10-20 pounds within the next 60 days  THN Long Term Goal Start Date  05/10/16  THN CM Short Term Goal #1 (0-30 days)  Member will report plan to manage pain in order to increase exercise within the next 4 weeks  THN CM Short Term Goal #1 Start Date  05/10/16  Interventions for Short Term Goal #2   Advised member to discuss pain barrier with primary MD.  Discussed how decreasing pain can increase tolerance for activity  THN CM Short Term Goal #2 (0-30 days)  Member will weigh self weekly and record readings over the next 4 weeks  THN CM Short Term Goal #2 Start Date  05/10/16  Interventions for Short Term Goal #2  Edcuated member on the importance of monitoring weights in effort to manage weight loss plan.  Member has [facility] calendar tool to record weights     Valente David, Therapist, sports, MSN [place] Manager [phone]

## 2016-05-15 ENCOUNTER — Other Ambulatory Visit (INDEPENDENT_AMBULATORY_CARE_PROVIDER_SITE_OTHER): Payer: Commercial Managed Care - HMO

## 2016-05-15 DIAGNOSIS — E1165 Type 2 diabetes mellitus with hyperglycemia: Secondary | ICD-10-CM

## 2016-05-15 DIAGNOSIS — E1121 Type 2 diabetes mellitus with diabetic nephropathy: Secondary | ICD-10-CM | POA: Diagnosis not present

## 2016-05-15 LAB — BASIC METABOLIC PANEL
BUN: 11 mg/dL (ref 6–23)
CO2: 29 mEq/L (ref 19–32)
Calcium: 9.5 mg/dL (ref 8.4–10.5)
Chloride: 99 mEq/L (ref 96–112)
Creatinine, Ser: 0.84 mg/dL (ref 0.40–1.20)
GFR: 86.68 mL/min (ref >60.00)
Glucose, Bld: 107 mg/dL — ABNORMAL HIGH (ref 70–99)
Potassium: 3.9 mEq/L (ref 3.5–5.1)
Sodium: 135 mEq/L (ref 135–145)

## 2016-05-16 LAB — FRUCTOSAMINE: Fructosamine: 290 umol/L — ABNORMAL HIGH (ref 0–285)

## 2016-05-22 ENCOUNTER — Ambulatory Visit (INDEPENDENT_AMBULATORY_CARE_PROVIDER_SITE_OTHER): Payer: Commercial Managed Care - HMO | Admitting: Endocrinology

## 2016-05-22 ENCOUNTER — Encounter: Payer: Self-pay | Admitting: Endocrinology

## 2016-05-22 VITALS — BP 132/80 | HR 71 | Ht 60.0 in | Wt 276.0 lb

## 2016-05-22 DIAGNOSIS — E1165 Type 2 diabetes mellitus with hyperglycemia: Secondary | ICD-10-CM

## 2016-05-22 DIAGNOSIS — Z794 Long term (current) use of insulin: Secondary | ICD-10-CM

## 2016-05-22 NOTE — Patient Instructions (Signed)
Novolog 12 at  supper, 10 for low carb meals  More sugars after supper

## 2016-05-22 NOTE — Progress Notes (Signed)
Patient ID: [name], female   DOB: [birth year], 70 y.o.   MRN: [id number]   Reason for Appointment: Diabetes follow-up   History of Present Illness   Diagnosis: Type 2 DIABETES MELITUS, date of diagnosis: 2008       PAST history: She has been on insulin for a few years Her blood sugars had initially responded very well to adding Victoza to her basal insulin in 2/13. She had lost weight and was able to comply with diet consistently. A1c had gone down to 7.2%. Also was needing less Levemir insulin .  However blood sugars will be higher if she goes off her diet  With good compliance her A1c was near normal at 6.3 in 5/14  RECENT HISTORY:    Insulin regimen: Toujeo 36 units hs; NovoLog 4/6--6-10 before meals       She is here for short-term follow-up with starting Jardiance on her last visit a month ago A1c is higher at 8.6 in October, previously as low as 7.2  Current management, blood sugar patterns and problems identified:  Her monitor was reviewed today, previously this was not available  Her blood sugars are looking significantly better overall with not as much variability as before and fairly good readings throughout the day  She has also been trying to exercise more  With Jardiance she has had no side effects or signs of UTI  Has lost weight of about 7 pounds  Her suppertime dose was reduced by 2 units for the Novolog but she does have a couple of high readings after supper, only once had a good readings when she had a salad; not checking enough readings after evening meal  Fasting readings are recently fairly good except today because of eating dessert last night after supper  Blood sugar ranges and averages as below  Hypoglycemia: mostly ac or hs, 1 twice a month  She has been advised on the diet previously. She was seen by nurse educator in early 2015 and advised to have balanced meals with more protein as well avoid all regular soft drinks  Oral  hypoglycemic drugs: None   Side effects from medications:  diarrhea from metformin,?  UTI from Invokana 300 mg            Proper timing of medications in relation to meals: Yes.    Monitors blood glucose: once or twice a day.    Glucometer:  Accu-Chek  Mean values apply above for all meters except median for One Touch  PRE-MEAL Fasting Lunch Dinner Bedtime Overall  Glucose range: 115-215  107-129  81-178     Mean/median:  139     142    POST-MEAL PC Breakfast PC Lunch PC Dinner  Glucose range:   109-227   Mean/median:       Meals: 3 meals per day. Supper 7 pm, Lunch is sandwich at 1-2 pm Bfst: grits/egg or biscuit Physical activity: walking  20 min, resuming at Gym also         Dietician visit: Most recent: 05/2015.   Wt Readings from Last 3 Encounters:  05/22/16 276 lb (125.2 kg)  04/17/16 283 lb 3.2 oz (128.5 kg)  04/12/16 281 lb (127.5 kg)   Lab Results  Component Value Date   HGBA1C 8.6 04/17/2016   HGBA1C 7.6 (H) 09/30/2015   HGBA1C 7.2 07/07/2015   Lab Results  Component Value Date   MICROALBUR <0.7 04/17/2016   LDLCALC 59 09/30/2015   CREATININE 0.84 05/15/2016  Medication List       Accurate as of 05/22/16  4:03 PM. Always use your most recent med list.          ACCU-CHEK FASTCLIX LANCETS Misc USE  TO CHECK BLOOD SUGAR THREE TIMES DAILY   ACCU-CHEK NANO SMARTVIEW w/Device Kit Use to check blood sugar 3 times per day dx code E11.65   ACCU-CHEK SMARTVIEW test strip Generic drug:  glucose blood CHECK BLOOD SUGAR THREE TIMES DAILY   ARIPiprazole 10 MG tablet Commonly known as:  ABILIFY Take 10 mg by mouth daily.   aspirin EC 81 MG tablet Take 1 tablet (81 mg total) by mouth at bedtime.   atorvastatin 40 MG tablet Commonly known as:  LIPITOR TAKE ONE TABLET BY MOUTH IN THE EVENING   B-D SINGLE USE SWABS REGULAR Pads Use as directed   carbidopa-levodopa 25-100 MG tablet Commonly known as:  SINEMET IR Take 1 tablet by mouth 3 (three)  times daily.   carvedilol 12.5 MG tablet Commonly known as:  COREG Take 1 tablet (12.5 mg total) by mouth 2 (two) times daily.   cholecalciferol 400 units Tabs tablet Commonly known as:  VITAMIN D Take 400 Units by mouth.   cholestyramine 4 g packet Commonly known as:  QUESTRAN Take 4 g by mouth 2 (two) times daily as needed.   empagliflozin 10 MG Tabs tablet Commonly known as:  JARDIANCE Take 10 mg by mouth daily with breakfast.   furosemide 40 MG tablet Commonly known as:  LASIX Take 40 mg twice daily on M/W/F and 40 mg once daily all other days   insulin aspart 100 UNIT/ML FlexPen Commonly known as:  NOVOLOG FLEXPEN Inject4- 6 units before breakfast and lunch and 10-12 units before dinner   Insulin Glargine 300 UNIT/ML Sopn Commonly known as:  TOUJEO SOLOSTAR Inject 40 Units into the skin daily.   Insulin Pen Needle 31G X 8 MM Misc Use to inject medication 4 times daily   isosorbide mononitrate 30 MG 24 hr tablet Commonly known as:  IMDUR Take 1 tablet (30 mg total) by mouth daily.   lamoTRIgine 150 MG tablet Commonly known as:  LAMICTAL Take 150 mg by mouth daily.   liraglutide 18 MG/3ML Sopn Commonly known as:  VICTOZA Inject 1.8 daily   meloxicam 15 MG tablet Commonly known as:  MOBIC Take 7.5 mg by mouth daily.   olmesartan 40 MG tablet Commonly known as:  BENICAR Take 40 mg by mouth daily.   omega-3 acid ethyl esters 1 g capsule Commonly known as:  LOVAZA Take 1 g by mouth daily.   Pancrelipase (Lip-Prot-Amyl) 25000 units Cpep Take 5 capsules by mouth 3 (three) times daily. 2 caps at breakfast, 1 at lunch, 2 at dinner   potassium chloride SA 20 MEQ tablet Commonly known as:  K-DUR,KLOR-CON Take 20 mEq by mouth daily.   pregabalin 50 MG capsule Commonly known as:  LYRICA Take 50 mg by mouth 2 (two) times daily.   primidone 50 MG tablet Commonly known as:  MYSOLINE Take 50 mg by mouth at bedtime.   sertraline 100 MG tablet Commonly known  as:  ZOLOFT Take 50 mg by mouth daily.       Allergies:  Allergies  Allergen Reactions  . Codeine     Headache and "just feel off"  . Penicillins Hives  . Sulfa Antibiotics Hives  . Other Rash    Bleach    Past Medical History:  Diagnosis Date  . Abnormal liver  function     in the past.  . Anemia   . Arthritis    all over  . Bruises easily   . Cataract    right eye;immature  . Chronic back pain    stenosis  . Chronic cough   . Demand myocardial infarction 2012   Demand Infarction in setting of Pancreatitis --> mild Troponin elevation, Non-obstructive CAD  . Depression    takes Abilify daily as well as Zoloft  . Diastolic heart failure 1610   Grade 1 diastolic Dysfunction by Echo   . Diverticulosis   . DM (diabetes mellitus) ([place])    takes Victoza daily as well as Lantus and Humalog  . Empty sella ([employer])    on MRI in 2009.  . Glaucoma   . Headache(784.0)    last migraine-4-88yr ago  . History of blood transfusion    no abnormal reaction noted  . History of colon polyps    benign  . History of influenza    antibiotic given yesterday along with taking Delsym cough syrup  . HTN (hypertension)    takes BKinder Morgan Energydaily  . Hyperlipidemia    takes Lipitor daily  . Joint swelling   . Nocturia   . Obstructive sleep apnea   . Pancreatitis    takes Pancrelipase daily  . Parkinson's disease ([place]    takes Sinemet daily  . Peripheral edema    takes Lasix daily  . [place]ipheral neuropathy ([place]   . Pneumonia 2012  . Seizure disorder ([place]   . Seizures ([place]    takes Lamictal daily and Primidone nightly;last seizure 2wks ago  . Urinary frequency   . Urinary urgency     Past Surgical History:  Procedure Laterality Date  . ABDOMINAL HYSTERECTOMY    . APPENDECTOMY    . BACK SURGERY    . CARDIAC CATHETERIZATION  2009/March 2012   2009: (Dr. KAlvie Heidelberg Nonobstructive CAD; 2012: Minimal CAD --> false positive stress test  . CHOLECYSTECTOMY    .  COLONOSCOPY N/A 08/15/2012   Procedure: COLONOSCOPY;  Surgeon: WArta Silence MD;  Location: [facility];  Service: Endoscopy;  Laterality: N/A;  . ESOPHAGOGASTRODUODENOSCOPY    . eye cysts Bilateral   . LASIK    . NM MYOVIEW LTD  March 2012; Aug 2017   Probably normal Lexiscan Myoview. Mild reversible Anterior Defect ~ Breast Attenuation. (CRO ischemia):  FALSE POSITIVE by Cath; b) 8/'17: LOW RISK, No ischemia or Infarction.  Breast Attenuation not noted.  .Marland KitchenRECTAL POLYPECTOMY    . TONSILLECTOMY    . TRANSTHORACIC ECHOCARDIOGRAM  01/2016   Normal LV chamber size with moderate LVH pattern. EF 50-55%. Severe LA dilation. Moderate RA dilation. PA pressure elevated at 38 mmHg (mild as (  . TUBAL LIGATION    . vaginal cyst removed     several times    Family History  Problem Relation Age of Onset  . Allergies Father   . Heart disease Father     before 645 . Heart failure Father   . Hypertension Father   . Hyperlipidemia Father   . Heart disease Mother   . Diabetes Mother   . Hypertension Mother   . Hyperlipidemia Mother   . Hypertension Sister   . Heart disease Sister     before 656 . Hyperlipidemia Sister   . Diabetes Brother   . Hypertension Brother   . Diabetes Sister   . Hypertension Sister   . Diabetes Son   .  Hypertension Son   . Cancer Other     Social History:  reports that she has quit smoking. Her smoking use included Cigarettes. She has a 12.50 pack-year smoking history. She has never used smokeless tobacco. She reports that she does not drink alcohol or use drugs.  ROS:   Hypertension: currently on Carvedilol, Benicar and Lasix.  Does not monitor at home  Edema treated with Lasix 40 mg, ?  Taking Reduced dose since starting Jardiance and has had no edema  BP Readings from Last 3 Encounters:  05/22/16 132/80  05/10/16 132/78  04/17/16 138/88    Hypercholesterolemia: On LipitorWith following results   Lab Results  Component Value Date   CHOL 135  09/30/2015   HDL 62.50 09/30/2015   LDLCALC 59 09/30/2015   LDLDIRECT 66.0 01/27/2015   TRIG 71.0 09/30/2015   CHOLHDL 2 09/30/2015   Foot exam done in 5/15   She continues to be on multiple psychotropic drugs including Lamictal and Zoloft  No recent problems with urinary retention, excessive frequency but does have some urgency   Examination:   BP 132/80   Pulse 71   Ht 5' (1.524 m)   Wt 276 lb (125.2 kg)   BMI 53.90 kg/m   Body mass index is 53.9 kg/m.   No pedal edema present  Assesment:   Diabetes type 2, uncontrolled    See history of present illness for detailed discussion of current blood sugar patterns, problems identified and current management  Blood sugars are looking fairly good with average about 142 recently This may get significant improvement with adding Jardiance since her A1c previously was 8.6 Also not having any side effects yet She has done fairly well with monitoring and trying to improve diet However has still relatively high readings after supper are less eating a very low carbohydrate meals Fasting readings are fairly good with reducing Toujeo down to while starting the Jardiance  She does have relatively higher postprandial readings unless eating salads  HYPERTENSION: Her blood pressure is better with adding Jardiance  Also no change in electrolytes/renal function with adding Jardiance  PLAN:   She will add 2 more units for evening meal and adjust the dose based on carb  Intake  Does need more monitoring after supper  No other changes    Patient Instructions  Novolog 12 at  supper, 10 for low carb meals  More sugars after supper    Counseling time on subjects discussed above is over 50% of today's 25 minute visit     Shayra Anton 05/22/2016, 4:03 PM

## 2016-05-31 DIAGNOSIS — H402231 Chronic angle-closure glaucoma, bilateral, mild stage: Secondary | ICD-10-CM | POA: Diagnosis not present

## 2016-05-31 DIAGNOSIS — H2513 Age-related nuclear cataract, bilateral: Secondary | ICD-10-CM | POA: Diagnosis not present

## 2016-05-31 DIAGNOSIS — H40013 Open angle with borderline findings, low risk, bilateral: Secondary | ICD-10-CM | POA: Diagnosis not present

## 2016-05-31 DIAGNOSIS — E119 Type 2 diabetes mellitus without complications: Secondary | ICD-10-CM | POA: Diagnosis not present

## 2016-05-31 DIAGNOSIS — H401121 Primary open-angle glaucoma, left eye, mild stage: Secondary | ICD-10-CM | POA: Diagnosis not present

## 2016-05-31 DIAGNOSIS — H401112 Primary open-angle glaucoma, right eye, moderate stage: Secondary | ICD-10-CM | POA: Diagnosis not present

## 2016-06-01 ENCOUNTER — Other Ambulatory Visit: Payer: Self-pay

## 2016-06-01 ENCOUNTER — Telehealth: Payer: Self-pay | Admitting: Endocrinology

## 2016-06-01 MED ORDER — FLUCONAZOLE 150 MG PO TABS: 150.0000 mg | ORAL_TABLET | Freq: Once | ORAL | 0 refills | Status: DC

## 2016-06-01 MED ORDER — FLUCONAZOLE 150 MG PO TABS: 150.0000 mg | ORAL_TABLET | Freq: Once | ORAL | 1 refills | Status: AC

## 2016-06-01 NOTE — Telephone Encounter (Signed)
Diflucan 150 mg, one tablet.  May repeat once.  Also can use the OTC Monistat cream alternatively

## 2016-06-01 NOTE — Telephone Encounter (Signed)
Called and left voice mail with instructions and ordered prescription

## 2016-06-01 NOTE — Telephone Encounter (Signed)
Pt called in and said that Dr. Dwyane Dee had previously asked her about vaginal problems from her medication he had prescribed for her, she wasn't at the time but she is now having yeast infection symptoms from her medication and wants to know if Dr. Dwyane Dee can call her something in for it.

## 2016-06-05 NOTE — Telephone Encounter (Signed)
Pt called to advise she is not on CPAP, she can not afford it. Please call

## 2016-06-06 NOTE — Telephone Encounter (Signed)
Patient has severe sleep apnea. I contacted Stanton Kidney with [facility] about anyway to work with this patient?

## 2016-06-07 ENCOUNTER — Other Ambulatory Visit: Payer: Self-pay | Admitting: *Deleted

## 2016-06-07 NOTE — Telephone Encounter (Signed)
I spoke to patient and she would like to pursue getting assistance for a CPAP machine. I will get ppw together.

## 2016-06-07 NOTE — Patient Outreach (Signed)
[place] [facility]) Care Management   06/07/2016  [name] [birth year] [id number]  [name] is an 70 y.o. female  Subjective:   Member reports that she is doing "pretty good."  Today she denies any pain or discomfort.  She reports compliance with all medications and with blood sugar checks.  She does not check her blood pressure daily, but does check it on almost a weekly basis.    Objective:   Review of Systems  Constitutional: Negative.   HENT: Negative.   Eyes: Negative.   Respiratory: Negative.   Cardiovascular: Negative.   Gastrointestinal: Negative.   Genitourinary: Negative.   Musculoskeletal: Positive for joint pain.  Skin: Negative.   Neurological: Negative.   Endo/Heme/Allergies: Negative.   Psychiatric/Behavioral: Negative.     Physical Exam  Constitutional: She is oriented to person, place, and time. She appears well-developed and well-nourished.  Neck: Normal range of motion.  Cardiovascular: Normal rate, regular rhythm and normal heart sounds.   Respiratory: Effort normal and breath sounds normal.  GI: Soft. Bowel sounds are normal.  Musculoskeletal: Normal range of motion.  Neurological: She is alert and oriented to person, place, and time.  Skin: Skin is warm and dry.   BP 118/72 (BP Location: Left Arm, Patient Position: Sitting, Cuff Size: Large)   Pulse 63   Resp 20   SpO2 96%   Encounter Medications:   Outpatient Encounter Prescriptions as of 06/07/2016  Medication Sig  . ACCU-CHEK FASTCLIX LANCETS MISC USE  TO CHECK BLOOD SUGAR THREE TIMES DAILY  . ACCU-CHEK SMARTVIEW test strip CHECK BLOOD SUGAR THREE TIMES DAILY  . Alcohol Swabs (B-D SINGLE USE SWABS REGULAR) PADS Use as directed  . ARIPiprazole (ABILIFY) 10 MG tablet Take 10 mg by mouth daily.  Marland Kitchen aspirin EC 81 MG tablet Take 1 tablet (81 mg total) by mouth at bedtime.  Marland Kitchen atorvastatin (LIPITOR) 40 MG tablet TAKE ONE TABLET BY MOUTH IN THE EVENING  . Blood Glucose  Monitoring Suppl (ACCU-CHEK NANO SMARTVIEW) W/DEVICE KIT Use to check blood sugar 3 times per day dx code E11.65  . carbidopa-levodopa (SINEMET IR) 25-100 MG per tablet Take 1 tablet by mouth 3 (three) times daily.   . carvedilol (COREG) 12.5 MG tablet Take 1 tablet (12.5 mg total) by mouth 2 (two) times daily.  . cholecalciferol (VITAMIN D) 400 units TABS tablet Take 400 Units by mouth.  . cholestyramine (QUESTRAN) 4 G packet Take 4 g by mouth 2 (two) times daily as needed.  . empagliflozin (JARDIANCE) 10 MG TABS tablet Take 10 mg by mouth daily with breakfast.  . furosemide (LASIX) 40 MG tablet Take 40 mg twice daily on M/W/F and 40 mg once daily all other days  . insulin aspart (NOVOLOG FLEXPEN) 100 UNIT/ML FlexPen Inject4- 6 units before breakfast and lunch and 10-12 units before dinner (Patient taking differently: Inject 6 units before breakfast and lunch and 10 units before dinner)  . Insulin Glargine (TOUJEO SOLOSTAR) 300 UNIT/ML SOPN Inject 40 Units into the skin daily. (Patient taking differently: Inject 36 Units into the skin daily. )  . Insulin Pen Needle 31G X 8 MM MISC Use to inject medication 4 times daily  . isosorbide mononitrate (IMDUR) 30 MG 24 hr tablet Take 1 tablet (30 mg total) by mouth daily.  Marland Kitchen lamoTRIgine (LAMICTAL) 150 MG tablet Take 150 mg by mouth daily.   . Liraglutide (VICTOZA) 18 MG/3ML SOPN Inject 1.8 daily  . meloxicam (MOBIC) 15 MG tablet Take 7.5  mg by mouth daily.   Marland Kitchen olmesartan (BENICAR) 40 MG tablet Take 40 mg by mouth daily.  Marland Kitchen omega-3 acid ethyl esters (LOVAZA) 1 G capsule Take 1 g by mouth daily.  . Pancrelipase, Lip-Prot-Amyl, 25000 UNITS CPEP Take 5 capsules by mouth 3 (three) times daily. 2 caps at breakfast, 1 at lunch, 2 at dinner  . potassium chloride SA (K-DUR,KLOR-CON) 20 MEQ tablet Take 20 mEq by mouth daily.    . pregabalin (LYRICA) 50 MG capsule Take 50 mg by mouth 2 (two) times daily.  . primidone (MYSOLINE) 50 MG tablet Take 50 mg by mouth at  bedtime.   . sertraline (ZOLOFT) 100 MG tablet Take 50 mg by mouth daily.    No facility-administered encounter medications on file as of 06/07/2016.     Functional Status:   In your present state of health, do you have any difficulty performing the following activities: 05/02/2016 04/12/2016  Hearing? N N  Vision? Y Y  Difficulty concentrating or making decisions? N N  Walking or climbing stairs? Y Y  Dressing or bathing? N N  Doing errands, shopping? [name]  Preparing Food and eating ? N N  Using the Toilet? N N  In the past six months, have you accidently leaked urine? Y Y  Do you have problems with loss of bowel control? N N  Managing your Medications? N N  Managing your Finances? N N  Housekeeping or managing your Housekeeping? Y N  Some recent data might be hidden    Fall/Depression Screening:    PHQ 2/9 Scores 05/02/2016 03/29/2016 06/02/2015 05/13/2015  PHQ - 2 Score 1 1 0 0    Assessment:    Met with member at scheduled time.  Blood sugar and blood pressure logs reviewed.  Blood sugar range from 69-231, readings greater than 200 rare within the last month.  Blood pressure range from low 100s-150s/70s-90s.  Her highest was 177/98 a couple weeks ago.  She stated she woke up with dizziness, after checking her blood pressure she took her medications and rested, blood pressure decreased on next reading.  She state that was the only day her blood pressure has been elevated.   According to chart, member has followed up with endocrinologist.  Although her blood sugar remains elevated at times, her range is much more controlled over the last month.  She has follow up next month for labs and office visit.  She state that her symptoms of UTI (from [place]) are now gone.  She is advised to continue to check and record her blood sugar readings several times a day.  Member denies obtaining her CPAP machine yet.  She continue to state that she is unable to afford at this time.  She is advised  to reconsider and discuss with family as it is strongly advised for her use.  She verbalizes understanding.  Discussed ongoing [facility] involvement with member.  Her A1C remains slightly elevated, and she is still in need of ongoing education for diabetes management (diet, exercise, etc.)  She is no longer in need for home visits as all community goals have been met.  She is open to involvement with health coach.  Will place referral.  She is advised to notify them if her health status changes and she is in need of a home visit.  She verbalizes understanding.  Plan:   Will place referral to health coach. Will send primary MD discipline closure letter.  [facility] CM Care Plan Problem Two  Flowsheet Row Most Recent Value  Care Plan Problem Two  Knowledge deficit of diabetes management related to weight management as evidenced by member's stated concern  Role Documenting the Problem Two  Care Management [place] for Problem Two  Active  Interventions for Problem Two Long Term Goal   Edcuated member using teachback about importane of weight management as it relates to management of diabetes.  Discussed ways to decrease weight such as diet and exercise.  [facility] Long Term Goal (31-90) days  Member will have a decrease in weight by 10-20 pounds within the next 60 days  [facility] Long Term Goal Start Date  05/10/16  [facility] CM Short Term Goal #1 (0-30 days)  Member will report plan to manage pain in order to increase exercise within the next 4 weeks  [facility] CM Short Term Goal #1 Start Date  05/10/16  Interventions for Short Term Goal #2   Advised member to discuss pain barrier with primary MD.  Discussed how decreasing pain can increase tolerance for activity  [facility] CM Short Term Goal #2 (0-30 days)  Member will weigh self weekly and record readings over the next 4 weeks  [facility] CM Short Term Goal #2 Start Date  05/10/16  [facility] CM Short Term Goal #2 Met Date  06/07/16  Interventions for Short Term Goal #2  Edcuated  member on the importance of monitoring weights in effort to manage weight loss plan.  Member has [facility] calendar tool to record weights     Valente David, Therapist, sports, MSN [place] Manager [phone]

## 2016-06-07 NOTE — Telephone Encounter (Signed)
Reply from [facility]:  [name]!  Unfortunately we don't have an assistance program. But the patient can reach out to [employer] ([employer]). They do provide assistance for machines. I can fax you the paperwork if you would like.   Thanks!   I gave her my fax number and asked her to send the ppw.

## 2016-06-08 ENCOUNTER — Encounter: Payer: Self-pay | Admitting: Pharmacist

## 2016-06-08 ENCOUNTER — Other Ambulatory Visit: Payer: Self-pay | Admitting: Pharmacist

## 2016-06-08 NOTE — Patient Outreach (Signed)
[place] [facility]) Care Management  06/08/2016  MELESA BADAMO 1945-10-22 [id number]  70 year old female referred to [facility] for medication review due to patient being on >15 medications.  Unable to reach patient via telephone today (unsuccessful outreach #1).    Plan: Will followup via telephone in 1 week.   [name], PharmD [facility] PGY2 Pharmacy Resident [phone]

## 2016-06-11 ENCOUNTER — Ambulatory Visit (INDEPENDENT_AMBULATORY_CARE_PROVIDER_SITE_OTHER): Payer: Commercial Managed Care - HMO | Admitting: Cardiology

## 2016-06-11 ENCOUNTER — Encounter: Payer: Self-pay | Admitting: Cardiology

## 2016-06-11 VITALS — BP 189/86 | HR 72 | Ht 60.0 in | Wt 279.4 lb

## 2016-06-11 DIAGNOSIS — E662 Morbid (severe) obesity with alveolar hypoventilation: Secondary | ICD-10-CM

## 2016-06-11 DIAGNOSIS — R55 Syncope and collapse: Secondary | ICD-10-CM

## 2016-06-11 DIAGNOSIS — I5032 Chronic diastolic (congestive) heart failure: Secondary | ICD-10-CM

## 2016-06-11 DIAGNOSIS — I10 Essential (primary) hypertension: Secondary | ICD-10-CM

## 2016-06-11 DIAGNOSIS — I2510 Atherosclerotic heart disease of native coronary artery without angina pectoris: Secondary | ICD-10-CM

## 2016-06-11 DIAGNOSIS — R000 Tachycardia, unspecified: Secondary | ICD-10-CM | POA: Diagnosis not present

## 2016-06-11 DIAGNOSIS — Z6841 Body Mass Index (BMI) 40.0 and over, adult: Secondary | ICD-10-CM

## 2016-06-11 DIAGNOSIS — R600 Localized edema: Secondary | ICD-10-CM

## 2016-06-11 DIAGNOSIS — R0609 Other forms of dyspnea: Secondary | ICD-10-CM

## 2016-06-11 DIAGNOSIS — E785 Hyperlipidemia, unspecified: Secondary | ICD-10-CM

## 2016-06-11 DIAGNOSIS — R609 Edema, unspecified: Secondary | ICD-10-CM

## 2016-06-11 MED ORDER — CARVEDILOL 25 MG PO TABS: 25.0000 mg | ORAL_TABLET | Freq: Two times a day (BID) | ORAL | 3 refills | Status: DC

## 2016-06-11 NOTE — Patient Instructions (Addendum)
CHANGE IN MEDICATIONS  INCREASE  CARVEDILOL 25 MG  TWICE A DAY    Your physician wants you to follow-up in 4- 5 MONTHS WITH DR Ellyn Hack- You will receive a reminder letter in the mail two months in advance. If you don't receive a letter, please call our office to schedule the follow-up appointment.   If you need a refill on your cardiac medications before your next appointment, please call your pharmacy.

## 2016-06-11 NOTE — Progress Notes (Signed)
PCP: [name], MD  Clinic Note: Chief Complaint  Patient presents with  . Follow-up    30 D Event monitor for presyncope  . Chest Pain    (recent Myoview Negative) pt states a sharp pain along with some SOB   . Edema    in both ankles and feets    HPI: [name] is a 70 y.o. female with a PMH (Notable for nonocclusive CAD by cath in 2012, hypertension, obesity with obesity hypoventilation syndrome and "chronic diastolic heart failure". She is also had near syncope with palpitations). who presents today for Three-month follow-up after having worn a monitor to evaluate palpitations.   I last saw her in September 2017 for a one-month follow-up after having nausea for a while. I evaluated some dyspnea and chest discomfort with a stress test and echocardiogram that were relatively normal as noted below. She was noting some exertional dyspnea and edema. Also noted palpitations. We ordered a 30 day monitor review below.  Recent Hospitalizations: None  Studies Reviewed:   30 Day Monitor: The patient's monitoring period was 03/09/2016 - 04/07/2016. Baseline sample showed Sinus Rhythm w/PACs/Artifact with a heart rate of 74.8 bpm.  Mostly sinus rhythm with occasional PACs. Rate ranges mostly from the 60s to 90s.  No arrhythmias with the exception of 1 short (beats) run of atrial tachycardia - a rate of roughly 139 bpm   Interval History: [name] presents today for follow-up really doing okay. She still has the occasional sharp symptoms in her chest, but they seem to be coming from both sides of the sternum toward both sides. She still notes occasional palpitations, but notices that since we increased her beta blocker and these have improved quite a bit. She still has exertional dyspnea but that is also improved. She has notable deconditioning. No significant PND or orthopnea. She is in the process of undergoing evaluation for CPAP for OSA and OHS.  It does not appear that she has  had anymore of the presyncopal type symptoms that she had before. The quivering sensations in her chest also notably improved with the increased dose of beta blocker. No TIA or amaurosis fugax symptoms.  ROS: A comprehensive was performed. Review of Systems  Constitutional: Positive for malaise/fatigue (Doesn't feel rested after sleep). Negative for weight loss.  HENT: Negative for congestion, hearing loss and nosebleeds.   Respiratory: Positive for shortness of breath. Negative for cough and wheezing.   Cardiovascular: Positive for chest pain (Sharp tinges of discomfort at rest or with deep inspiration). Negative for palpitations (Notable improvement).  Gastrointestinal: Negative.  Negative for abdominal pain, blood in stool, melena, nausea and vomiting.  Genitourinary: Negative for hematuria.  Neurological: Negative for dizziness, focal weakness, loss of consciousness (No further presyncope) and weakness.  Endo/Heme/Allergies: Negative for environmental allergies.  Psychiatric/Behavioral: Negative for depression and memory loss. The patient is not nervous/anxious and does not have insomnia.   All other systems reviewed and are negative.   Past Medical History:  Diagnosis Date  . Abnormal liver function     in the past.  . Anemia   . Arthritis    all over  . Bruises easily   . Cataract    right eye;immature  . Chronic back pain    stenosis  . Chronic cough   . Demand myocardial infarction 2012   Demand Infarction in setting of Pancreatitis --> mild Troponin elevation, Non-obstructive CAD  . Depression    takes Abilify daily as well as Zoloft  .  Diastolic heart failure 6270   Grade 1 diastolic Dysfunction by Echo   . Diverticulosis   . DM (diabetes mellitus) ([place])    takes Victoza daily as well as Lantus and Humalog  . Empty sella ([place])    on MRI in 2009.  . Glaucoma   . Headache(784.0)    last migraine-4-31yr ago  . History of blood transfusion    no abnormal reaction  noted  . History of colon polyps    benign  . History of influenza    antibiotic given yesterday along with taking Delsym cough syrup  . HTN (hypertension)    takes BKinder Morgan Energydaily  . Hyperlipidemia    takes Lipitor daily  . Joint swelling   . Nocturia   . Obstructive sleep apnea   . Pancreatitis    takes Pancrelipase daily  . Parkinson's disease ([place]    takes Sinemet daily  . Peripheral edema    takes Lasix daily  . Peripheral neuropathy ([place]   . Pneumonia 2012  . [place]ure disorder ([place]   . Seizures ([place]    takes Lamictal daily and Primidone nightly;last seizure 2wks ago  . Urinary frequency   . Urinary urgency     Past Surgical History:  Procedure Laterality Date  . ABDOMINAL HYSTERECTOMY    . APPENDECTOMY    . BACK SURGERY    . CARDIAC CATHETERIZATION  2009/March 2012   2009: (Dr. KAlvie Heidelberg Nonobstructive CAD; 2012: Minimal CAD --> false positive stress test  . CHOLECYSTECTOMY    . COLONOSCOPY N/A 08/15/2012   Procedure: COLONOSCOPY;  Surgeon: WArta Silence MD;  Location: [facility];  Service: Endoscopy;  Laterality: N/A;  . ESOPHAGOGASTRODUODENOSCOPY    . eye cysts Bilateral   . LASIK    . NM MYOVIEW LTD  March 2012; Aug 2017   Probably normal Lexiscan Myoview. Mild reversible Anterior Defect ~ Breast Attenuation. (CRO ischemia):  FALSE POSITIVE by Cath; b) 8/'17: LOW RISK, No ischemia or Infarction.  Breast Attenuation not noted.  .Marland KitchenRECTAL POLYPECTOMY    . TONSILLECTOMY    . TRANSTHORACIC ECHOCARDIOGRAM  01/2016   Normal LV chamber size with moderate LVH pattern. EF 50-55%. Severe LA dilation. Moderate RA dilation. PA pressure elevated at 38 mmHg (mild as (  . TUBAL LIGATION    . vaginal cyst removed     several times    Current Meds  Medication Sig  . ACCU-CHEK FASTCLIX LANCETS MISC USE  TO CHECK BLOOD SUGAR THREE TIMES DAILY  . ACCU-CHEK SMARTVIEW test strip CHECK BLOOD SUGAR THREE TIMES DAILY  . Alcohol Swabs (B-D SINGLE USE SWABS  REGULAR) PADS Use as directed  . ARIPiprazole (ABILIFY) 10 MG tablet Take 10 mg by mouth daily.  .Marland Kitchenaspirin EC 81 MG tablet Take 1 tablet (81 mg total) by mouth at bedtime.  .Marland Kitchenatorvastatin (LIPITOR) 40 MG tablet TAKE ONE TABLET BY MOUTH IN THE EVENING  . Blood Glucose Monitoring Suppl (ACCU-CHEK NANO SMARTVIEW) W/DEVICE KIT Use to check blood sugar 3 times per day dx code E11.65  . carbidopa-levodopa (SINEMET IR) 25-100 MG per tablet Take 1 tablet by mouth 3 (three) times daily.   . cholecalciferol (VITAMIN D) 400 units TABS tablet Take 400 Units by mouth.  . cholestyramine (QUESTRAN) 4 G packet Take 4 g by mouth 2 (two) times daily as needed.  . empagliflozin (JARDIANCE) 10 MG TABS tablet Take 10 mg by mouth daily with breakfast.  . furosemide (LASIX) 40 MG tablet Take 40 mg  twice daily on M/W/F and 40 mg once daily all other days  . insulin aspart (NOVOLOG FLEXPEN) 100 UNIT/ML FlexPen Inject4- 6 units before breakfast and lunch and 10-12 units before dinner (Patient taking differently: Inject 6 units before breakfast and lunch and 10 units before dinner)  . Insulin Glargine (TOUJEO SOLOSTAR) 300 UNIT/ML SOPN Inject 40 Units into the skin daily. (Patient taking differently: Inject 36 Units into the skin daily. )  . Insulin Pen Needle 31G X 8 MM MISC Use to inject medication 4 times daily  . isosorbide mononitrate (IMDUR) 30 MG 24 hr tablet Take 1 tablet (30 mg total) by mouth daily.  Marland Kitchen lamoTRIgine (LAMICTAL) 150 MG tablet Take 150 mg by mouth daily.   . Liraglutide (VICTOZA) 18 MG/3ML SOPN Inject 1.8 daily  . meloxicam (MOBIC) 15 MG tablet Take 7.5 mg by mouth daily.   Marland Kitchen olmesartan (BENICAR) 40 MG tablet Take 40 mg by mouth daily.  Marland Kitchen omega-3 acid ethyl esters (LOVAZA) 1 G capsule Take 1 g by mouth daily.  . Pancrelipase, Lip-Prot-Amyl, 25000 UNITS CPEP Take 5 capsules by mouth 3 (three) times daily. 2 caps at breakfast, 1 at lunch, 2 at dinner  . potassium chloride SA (K-DUR,KLOR-CON) 20 MEQ  tablet Take 20 mEq by mouth daily.    . pregabalin (LYRICA) 50 MG capsule Take 50 mg by mouth 2 (two) times daily.  . primidone (MYSOLINE) 50 MG tablet Take 50 mg by mouth at bedtime.   . sertraline (ZOLOFT) 100 MG tablet Take 50 mg by mouth daily.     Allergies  Allergen Reactions  . Codeine     Headache and "just feel off"  . Penicillins Hives  . Sulfa Antibiotics Hives  . Other Rash    [place]    Social History   Social History  . Marital status: Divorced    Spouse name: N/A  . Number of children: N/A  . Years of education: N/A   Occupational History  . Disabled    Social History Main Topics  . Smoking status: Former Smoker    Packs/day: 0.50    Years: 25.00    Types: Cigarettes  . Smokeless tobacco: Never Used     Comment: quit smoking 20+ytrs ago  . Alcohol use No  . Drug use: No  . Sexual activity: No   Other Topics Concern  . None   Social History Narrative   She lives in [place]. She has lots of family in the area and is accompanied by her sister.   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   She has a distant history of smoking, quit 20 years ago.       family history includes Allergies in her father; Cancer in her other; Diabetes in her brother, mother, sister, and son; Heart disease in her father, mother, and sister; Heart failure in her father; Hyperlipidemia in her father, mother, and sister; Hypertension in her brother, father, mother, sister, sister, and son.  Wt Readings from Last 3 Encounters:  06/11/16 126.7 kg (279 lb 6.4 oz)  05/22/16 125.2 kg (276 lb)  04/17/16 128.5 kg (283 lb 3.2 oz)    PHYSICAL EXAM BP (!) 189/86   Pulse 72   Ht 5' (1.524 m)   Wt 126.7 kg (279 lb 6.4 oz)   SpO2 90%   BMI 54.57 kg/m  General appearance: alert, cooperative, appears stated age, no distress and Morbidly obese Neck: no adenopathy, no carotid bruit and  no JVD Lungs: clear to auscultation bilaterally, normal percussion  bilaterally and non-labored Heart: regular rate and rhythm; distant S1& S2 normal, soft SEM & HSM @ LSB; No, click, rub or gallop; Palpate PMI Abdomen: soft, non-tender; bowel sounds normal; no masses; morbidly obese. Cannot palpate hepatomegaly. Extremities: extremities normal, atraumatic, no cyanosis, and edema trace Pulses: 2+ and symmetric;  Skin: mobility and turgor normal, no evidence of bleeding or bruising and no lesions noted  Neurologic: Mental status: Alert, oriented, thought content appropriate Cranial nerves: normal (II-XII grossly intact) Psych: anxious mood & affect.      Adult ECG Report  Rate: 72 ;  Rhythm: normal sinus rhythm and premature atrial contractions (PAC);  Otherwise normal axis, intervals, and durations.  Narrative Interpretation: Normal EKG   Other studies Reviewed: Additional studies/ records that were reviewed today include:  Recent Labs:   Lab Results  Component Value Date   CREATININE 0.84 05/15/2016   BUN 11 05/15/2016   NA 135 05/15/2016   K 3.9 05/15/2016   CL 99 05/15/2016   CO2 29 05/15/2016    ASSESSMENT / PLAN: Problem List Items Addressed This Visit    Obesity hypoventilation syndrome (HCC) (Chronic)    Pending OSA evaluation. Needs CPAP. Consider GOP.  Would be low cardiac risk provided BP controlled.      Morbid obesity with BMI of 50.0-59.9, adult (HCC) (Chronic)    Probably one main reason for exertional dyspnea. Needs to lose weight. ? Water walking / aerobics Consider GOP.       Essential hypertension - Primary (Chronic)    BP still too high --> Increase Carvedilol to 25 mg BID      Relevant Medications   carvedilol (COREG) 25 MG tablet   Other Relevant Orders   EKG 12-Lead   Dyslipidemia, goal LDL below 100 (Chronic)   Relevant Medications   carvedilol (COREG) 25 MG tablet   Coronary artery disease, non-occlusive (Chronic)    Confirmed with negative Myoview- (had Breast Attenuation artifact noted in 2012)  On  ASA, BB & statin. Continue glycemic control Needs wgt loss.      Relevant Medications   carvedilol (COREG) 25 MG tablet   Other Relevant Orders   EKG 12-Lead   Chronic diastolic congestive heart failure (HCC) (Chronic)    Moderate LVH on echo, but not significantly elevated pressures on Echo. No real PND/orthopnea.  Euvolemic clinically.  On stable dose of diuretic with edema pretty well controlled. - increase Carvedilol to 25 mg bid. She is already on high-dose almost olmesartan and Imdur.      Relevant Medications   carvedilol (COREG) 25 MG tablet   Other Relevant Orders   EKG 12-Lead   Peripheral edema (Chronic)    Probably not related to left-sided cardiac issues. Could be related to pulmonary hypertension from OHS or simply venous stasis. On stable dose of diuretic. Relatively well-controlled. Consider support stockings if they can fit.      DOE (dyspnea on exertion)   Relevant Orders   EKG 12-Lead   Pre-syncope    No further episodes - ?? Not sure what these spells were - none noted on Monitor. She had short bursts of PAT which, if prolonged, could lead to this Sx.  Plan: increase BB dose to 46m bid      Relevant Medications   carvedilol (COREG) 25 MG tablet   Racing heart beat    Short bursts of PAT along with PACs & PVCs on monitor --> increase Carvedilol  to 25 mg bid      Relevant Orders   EKG 12-Lead      Current medicines are reviewed at length with the patient today. (+/- concerns) palpitations/near syncope better with BB The following changes have been made: increase BB to 25 mg bid  Patient Instructions  CHANGE IN MEDICATIONS  INCREASE  CARVEDILOL 25 MG  TWICE A DAY    Your physician wants you to follow-up in 4- 5 MONTHS WITH DR Ellyn Hack- You will receive a reminder letter in the mail two months in advance. If you don't receive a letter, please call our office to schedule the follow-up appointment.   If you need a refill on your cardiac  medications before your next appointment, please call your pharmacy.    Studies Ordered:   Orders Placed This Encounter  Procedures  . EKG 12-Lead      Glenetta Hew, M.D., M.S. Interventional Cardiologist   Pager # [phone] Phone # [phone] [address] [place], [place] [address]

## 2016-06-12 ENCOUNTER — Other Ambulatory Visit: Payer: Self-pay | Admitting: Pharmacist

## 2016-06-12 NOTE — Patient Outreach (Signed)
[facility]) Care Management  06/12/2016  [name] [birth year] [id number]   69 year old female referred to [facility] for medication review due to patient being on >15 medications.  Unable to reach patient via telephone today (unsuccessful outreach #2).    Plan: Will followup via telephone in 1 week.   [name], PharmD [facility] PGY2 Pharmacy Resident [phone]

## 2016-06-13 ENCOUNTER — Encounter: Payer: Self-pay | Admitting: *Deleted

## 2016-06-13 ENCOUNTER — Encounter: Payer: Self-pay | Admitting: Cardiology

## 2016-06-13 NOTE — Assessment & Plan Note (Signed)
Confirmed with negative Myoview- (had Breast Attenuation artifact noted in 2012)  On ASA, BB & statin. Continue glycemic control Needs wgt loss.

## 2016-06-13 NOTE — Assessment & Plan Note (Signed)
Moderate LVH on echo, but not significantly elevated pressures on Echo. No real PND/orthopnea.  Euvolemic clinically.  On stable dose of diuretic with edema pretty well controlled. - increase Carvedilol to 25 mg bid. She is already on high-dose almost olmesartan and Imdur.

## 2016-06-13 NOTE — Assessment & Plan Note (Signed)
BP still too high --> Increase Carvedilol to 25 mg BID

## 2016-06-13 NOTE — Assessment & Plan Note (Signed)
No further episodes - ?? Not sure what these spells were - none noted on Monitor. She had short bursts of PAT which, if prolonged, could lead to this Sx.  Plan: increase BB dose to 25mg  bid

## 2016-06-13 NOTE — Telephone Encounter (Signed)
I received the ppw but it requires payment from our office and to fit the patient. [name], can you check to see if we can help this patient?

## 2016-06-13 NOTE — Assessment & Plan Note (Signed)
Probably one main reason for exertional dyspnea. Needs to lose weight. ? Water walking / aerobics Consider GOP.

## 2016-06-13 NOTE — Assessment & Plan Note (Signed)
Probably not related to left-sided cardiac issues. Could be related to pulmonary hypertension from OHS or simply venous stasis. On stable dose of diuretic. Relatively well-controlled. Consider support stockings if they can fit.

## 2016-06-13 NOTE — Assessment & Plan Note (Signed)
Pending OSA evaluation. Needs CPAP. Consider GOP.  Would be low cardiac risk provided BP controlled.

## 2016-06-13 NOTE — Assessment & Plan Note (Signed)
Short bursts of PAT along with PACs & PVCs on monitor --> increase Carvedilol to 25 mg bid

## 2016-06-19 ENCOUNTER — Other Ambulatory Visit: Payer: Self-pay

## 2016-06-19 ENCOUNTER — Other Ambulatory Visit: Payer: Self-pay | Admitting: Endocrinology

## 2016-06-19 MED ORDER — LIRAGLUTIDE 18 MG/3ML ~~LOC~~ SOPN: PEN_INJECTOR | SUBCUTANEOUS | 0 refills | Status: DC

## 2016-06-20 ENCOUNTER — Other Ambulatory Visit: Payer: Self-pay | Admitting: Pharmacist

## 2016-06-20 DIAGNOSIS — H25812 Combined forms of age-related cataract, left eye: Secondary | ICD-10-CM | POA: Diagnosis not present

## 2016-06-20 DIAGNOSIS — H25012 Cortical age-related cataract, left eye: Secondary | ICD-10-CM | POA: Diagnosis not present

## 2016-06-20 DIAGNOSIS — H2512 Age-related nuclear cataract, left eye: Secondary | ICD-10-CM | POA: Diagnosis not present

## 2016-06-20 NOTE — Patient Outreach (Signed)
[facility]) Care Management  06/20/2016  [name] 05/14/1946 [id number]  70 year old female referred to [facility] for medication review due to patient being on >15 medications. Unable to reach patient via telephone today but was able to reach patients sister whom stated patient had outpatient surgery today.  Patients sister stated that it would be best to cal patient back tomorrow.   Plan: Will followup via telephone tomorrow to assess medication management/adherence  [name], PharmD, [place] PGY2 Pharmacy Resident [phone]

## 2016-06-21 ENCOUNTER — Other Ambulatory Visit: Payer: Self-pay | Admitting: Pharmacist

## 2016-06-21 NOTE — Patient Outreach (Signed)
[facility] [facility]) Care Management  06/21/2016  [name] 06-16-1946 [id number]   70 year old female referred to [facility] for medication review due to patient being on >15 medications.  Called patient today to perform medication review.  Patient was hesitant to discuss medications over the phone due to being on ~22 medications.  Scheduled home visit for medication review for tomorrow.  [name], PharmD, [place] PGY2 Pharmacy Resident [phone]

## 2016-06-22 ENCOUNTER — Encounter: Payer: Self-pay | Admitting: Pharmacist

## 2016-06-22 ENCOUNTER — Other Ambulatory Visit: Payer: Self-pay | Admitting: Pharmacist

## 2016-06-22 NOTE — Patient Outreach (Signed)
[name]   06/22/2016  [name] 24-Apr-1946 [id number]  Subjective: 70 year old female referred to [place] for medication review due to patient being on >15 medications.  [facility] pharmacy home visit performed today to review medications.  Patient Reports adherence with her medications and Denies side effects.   Patient has foot pain which she states Lyrica does not help with.  Often misses lunch time doses of medications including Carbidopa/Levodopa and Novolog.   Highest CBG 193 and lowest CBG 87.   Denies hypoglycemic episodes with CBG < 70 but does report relative hypoglycemia with CBGs in 80s where she has some blurred vision.  Reports proper treatment knowledge of hypoglycemia management and has candy available at home to treat it.    Denies lower extremity edema.   Reports shortness of breath occasionally.  Does go to the gym 3 times per week for 45 minutes.  Denies chest pain.    Home Blood Pressure Readings 12/14 118/72 HR 72 12/7 158/83 HR 70 12/6 135/78 HR 79 12/5 106/70 HR 68  Objective:  Vitals:   06/22/16 1058  BP: (!) 161/96  Pulse: 63    Encounter Medications: Outpatient Encounter Prescriptions as of 06/22/2016  Medication Sig  . ACCU-CHEK FASTCLIX LANCETS MISC USE  TO CHECK BLOOD SUGAR THREE TIMES DAILY  . ACCU-CHEK SMARTVIEW test strip CHECK BLOOD SUGAR THREE TIMES DAILY  . Alcohol Swabs (B-D SINGLE USE SWABS REGULAR) PADS Use as directed  . ARIPiprazole (ABILIFY) 10 MG tablet Take 10 mg by mouth daily.  Marland Kitchen aspirin EC 81 MG tablet Take 1 tablet (81 mg total) by mouth at bedtime.  Marland Kitchen atorvastatin (LIPITOR) 40 MG tablet TAKE ONE TABLET BY MOUTH IN THE EVENING  . Blood Glucose Monitoring Suppl (ACCU-CHEK NANO SMARTVIEW) W/DEVICE KIT Use to check blood sugar 3 times per day dx code E11.65  . carbidopa-levodopa (SINEMET IR) 25-100 MG per tablet Take 1 tablet by mouth 3 (three) times daily.   .  carvedilol (COREG) 25 MG tablet Take 1 tablet (25 mg total) by mouth 2 (two) times daily.  . cholecalciferol (VITAMIN D) 400 units TABS tablet Take 400 Units by mouth.  . cholestyramine (QUESTRAN) 4 G packet Take 4 g by mouth 2 (two) times daily as needed.  . empagliflozin (JARDIANCE) 10 MG TABS tablet Take 10 mg by mouth daily with breakfast.  . furosemide (LASIX) 40 MG tablet Take 40 mg twice daily on M/W/F and 40 mg once daily all other days  . insulin aspart (NOVOLOG FLEXPEN) 100 UNIT/ML FlexPen Inject4- 6 units before breakfast and lunch and 10-12 units before dinner (Patient taking differently: Inject 6 units before breakfast and lunch and 10 units before dinner)  . Insulin Glargine (TOUJEO SOLOSTAR) 300 UNIT/ML SOPN Inject 40 Units into the skin daily. (Patient taking differently: Inject 36 Units into the skin daily. )  . Insulin Pen Needle 31G X 8 MM MISC Use to inject medication 4 times daily  . isosorbide mononitrate (IMDUR) 30 MG 24 hr tablet Take 1 tablet (30 mg total) by mouth daily.  Marland Kitchen lamoTRIgine (LAMICTAL) 150 MG tablet Take 150 mg by mouth daily.   Marland Kitchen liraglutide (VICTOZA) 18 MG/3ML SOPN INJECT 1.8MG DAILY (NEEDS APPOINTMENT FOR REFILLS)  . meloxicam (MOBIC) 15 MG tablet Take 7.5 mg by mouth daily.   Marland Kitchen olmesartan (BENICAR) 40 MG tablet Take 40 mg by mouth daily.  Marland Kitchen omega-3 acid ethyl esters (LOVAZA) 1 G capsule  Take 1 g by mouth daily.  . Pancrelipase, Lip-Prot-Amyl, 25000 UNITS CPEP Take 5 capsules by mouth 3 (three) times daily. 2 caps at breakfast, 1 at lunch, 2 at dinner  . potassium chloride SA (K-DUR,KLOR-CON) 20 MEQ tablet Take 20 mEq by mouth daily.    . pregabalin (LYRICA) 50 MG capsule Take 50 mg by mouth 2 (two) times daily.  . primidone (MYSOLINE) 50 MG tablet Take 50 mg by mouth at bedtime.   . sertraline (ZOLOFT) 100 MG tablet Take 50 mg by mouth daily.    No facility-administered encounter medications on file as of 06/22/2016.     Functional Status: In your  present state of health, do you have any difficulty performing the following activities: 05/02/2016 04/12/2016  Hearing? N N  Vision? Y Y  Difficulty concentrating or making decisions? N N  Walking or climbing stairs? Y Y  Dressing or bathing? N N  Doing errands, shopping? [name]  Preparing Food and eating ? N N  Using the Toilet? N N  In the past six months, have you accidently leaked urine? Y Y  Do you have problems with loss of bowel control? N N  Managing your Medications? N N  Managing your Finances? N N  Housekeeping or managing your Housekeeping? Y N  Some recent data might be hidden    Fall/Depression Screening: PHQ 2/9 Scores 05/02/2016 03/29/2016 06/02/2015 05/13/2015  PHQ - 2 Score 1 1 0 0    Assessment: Drugs sorted by system:  Neurologic/Psychologic:aripiprazole, carbidopa-levodopa, lamotrigine, pregabalin, primidone, sertraline  Cardiovascular:aspirin, atorvastatin, carvedilol, furosemide, isosorbide mononitrate, olmesartan, potassium  Gastrointestinal:cholestyramine, Zenpep  Endocrine: Jardiance, Novolog, Toujeo, Victoza  Pain: meloxicam  Vitamins/Minerals:vitamin b complex, vitamin d, multivitamin  Miscellaneous:Ketorolac eye drops, ofloxacin eye drops, prednisolone eye drops  Medications to avoid in the elderly:  Other issues noted:  Hypertension: Currently uncontrolled with blood pressure >140/90 during todays visit and home blood pressure readings above goal majority of the time.  Medication Assistance: [employer] patient with low income subsidy/extra help that can receive Tier 1 and 2 generics for $0 copay when written for 90 day supply.    Neuropathy/Leg Pain: Patient reports Lyrica does not help with her leg pain.  She denies side effects with her current dose of pregabalin 50 mg twice daily.   Plan: Will Contact [name] MD for 90 day supply of [place] ER 20 meq, Lamotrigine 100 mg, Atorvastatin 10 mg, sertraline 50 mg, and primidone 50 mg to be sent  to [place] as patient is eligible for $0 copay with Tier 1/2 medications written for 90 day supply Set phone reminder for Monday thru Friday for 3 pm to assist with remembering lunchtime medications  Will Recommend increasing lyrica to higher dose.  Instructed patient to discuss with MD as well.   Encouraged to check BP and weight more often and to write the values down to take to her primary care physician.  Telephone followup 1 month.    [name], PharmD, BCPS [facility] PGY2 Pharmacy Resident [phone]  [facility] CM Care Plan Problem One   Flowsheet Row Most Recent Value  Care Plan Problem One  Knowledge deficit related to medication adherence as evidenced by patient report of missed medications  Role Documenting the Problem One  Clinical Pharmacist  Care Plan for Problem One  Active  THN CM Short Term Goal #1 (0-30 days)  Patient will have >85% adherence to her medications as evidenced by patient report over the next  30 days  THN CM Short Term Goal #1 Start Date  06/22/16  Interventions for Short Term Goal #1  Counseled on medication adherence and set phone reminder for lunchtime medications.

## 2016-07-03 DIAGNOSIS — H353121 Nonexudative age-related macular degeneration, left eye, early dry stage: Secondary | ICD-10-CM | POA: Diagnosis not present

## 2016-07-03 DIAGNOSIS — H43822 Vitreomacular adhesion, left eye: Secondary | ICD-10-CM | POA: Diagnosis not present

## 2016-07-09 ENCOUNTER — Other Ambulatory Visit: Payer: Self-pay | Admitting: *Deleted

## 2016-07-12 NOTE — Patient Outreach (Signed)
[place] [facility]) Care Management  07/12/2016  EDOM MUNOS March 30, 1946 [id number]    RN Health Coach  Attempted #1 outreach call to patient.  Patient was unavailable. No voice mail pickup. Plan: RN will call patient again within 14 days.   [place] Care Management [phone]

## 2016-07-15 ENCOUNTER — Other Ambulatory Visit: Payer: Self-pay | Admitting: Cardiology

## 2016-07-19 ENCOUNTER — Ambulatory Visit: Payer: Self-pay | Admitting: Neurology

## 2016-07-19 DIAGNOSIS — H353121 Nonexudative age-related macular degeneration, left eye, early dry stage: Secondary | ICD-10-CM | POA: Diagnosis not present

## 2016-07-20 ENCOUNTER — Other Ambulatory Visit: Payer: Commercial Managed Care - HMO

## 2016-07-23 ENCOUNTER — Other Ambulatory Visit: Payer: Self-pay | Admitting: *Deleted

## 2016-07-23 ENCOUNTER — Ambulatory Visit: Payer: Commercial Managed Care - HMO | Admitting: Endocrinology

## 2016-07-24 ENCOUNTER — Other Ambulatory Visit: Payer: Self-pay | Admitting: Pharmacist

## 2016-07-24 ENCOUNTER — Encounter: Payer: Self-pay | Admitting: *Deleted

## 2016-07-24 DIAGNOSIS — Z803 Family history of malignant neoplasm of breast: Secondary | ICD-10-CM | POA: Diagnosis not present

## 2016-07-24 DIAGNOSIS — Z1231 Encounter for screening mammogram for malignant neoplasm of breast: Secondary | ICD-10-CM | POA: Diagnosis not present

## 2016-07-24 NOTE — Patient Outreach (Signed)
[place] [facility]) Care Management  07/24/2016  [name] 02/27/1946 [id number]  Subjective:  71 year old female referred to [place] for medication review due to patient being on >15 medications. [facility] telephone follow-up from last visit.  Patient reports that she did receive 90-day supplies of her medications from [facility] mail order. From last home visit, patient has started setting a 3:00 pm alarm on her phone to remind her to take her lunchtime dose of medications. She reports that this has helped her to remember her dose and has only missed 1 dose of her medications this week which was because she wasn't home when the alarm went off.  Patient is self-monitoring BP at home. She was not home at the time but said that her BP this morning was 128/76. Said that her systolic is commonly between 120-149.   Patient is self-monitoring BG at home. Did not have her meter with her but said that her BG was 92 this morning and 74 at lunch. Reports that her sugar is commonly < 100 but has not had any BG < 70. She does report some symptoms of nausea and sweating with BG in the 70s. Patient verbalized appropriate hypoglycemia management if her BG is < 70. Patient denied pain or burning during urination (UTIs associated with SGLT2 use).   Patient reports that she has been checking her weight regularly. Patient conveys some frustration because she feels like she is only gaining weight. She does report some considerable weight gain (>5 lbs from one day to the next) but denies edema. She endorses shortness of breath that is currently more significant than previously.  Patient still endorses significant foot pain (11 or 12 out of 10). She has not gotten a higher dose of Lyrica yet because she is waiting until her next doctor's appointment. She states she is exercising everyday but cannot walk because of the foot pain and has been riding an exercise bike instead.   Assessment: Patient has  improved adherence significantly from last visit.  Patient reporting relative hypoglycemia with symptoms of nausea and sweating; at high-risk for true hypoglycemia. Patient potentially fluid overloaded based on increased shortness of breath and weight gain.   Plan: Continue utilizing medication reminders to have > 85% adherence to medications over the next 30 days. Continue to self-monitor BG. Patient demonstrated understand of treating hypoglycemia. If at follow-up, patient reports any true hypoglycemic events, consider recommending reduced insulin dose.  Instructed the patient to call cardiologist office if shortness of breath worsens. Follow-up phone call in 1 month.  [name], PharmD, BCPS [facility] PGY2 Pharmacy Resident [phone]  [facility] CM Care Plan Problem One   Flowsheet Row Most Recent Value  Care Plan Problem One  Knowledge deficit related to medication adherence as evidenced by patient report of missed medications  Role Documenting the Problem One  Clinical Pharmacist  Care Plan for Problem One  Active  THN CM Short Term Goal #1 (0-30 days)  Patient will have >85% adherence to her medications as evidenced by patient report over the next 30 days  THN CM Short Term Goal #1 Start Date  07/24/16  Interventions for Short Term Goal #1  Discussed continung to utilize phone alarm reminders.

## 2016-07-26 NOTE — Patient Outreach (Signed)
[place] [facility]) Care Management  [id number]  [name] 11/24/1945 [id number]  RN Health Coach telephone call to patient.  [employer] compliance verified.Per patient her fasting blood sugar is 92 today. Patient A1C is 8.4 on 05/04/2016. Patient is exercising 3 x week. Patient is using oral agents and insulin. Patient has been giving insulin in abdomen only.  Patient stated she has maybe one to two episodes of hypoglycemia a month. Per patient she get sweaty, dizzy, weak, tunnel vision. . Per patient she eats candy or peanut butter. Patient stated it was 79 and she passed out. Per patient she has been getting up and exercising before breakfast. Patient has agreed to follow up outreach calls and education.    Current Medications:  Current Outpatient Prescriptions  Medication Sig Dispense Refill  . ACCU-CHEK FASTCLIX LANCETS MISC USE  TO CHECK BLOOD SUGAR THREE TIMES DAILY 306 each 0  . ACCU-CHEK SMARTVIEW test strip CHECK BLOOD SUGAR THREE TIMES DAILY 300 each 1  . Alcohol Swabs (B-D SINGLE USE SWABS REGULAR) PADS Use as directed 300 each 1  . ARIPiprazole (ABILIFY) 10 MG tablet Take 10 mg by mouth daily.    Marland Kitchen aspirin EC 81 MG tablet Take 1 tablet (81 mg total) by mouth at bedtime.    Marland Kitchen atorvastatin (LIPITOR) 40 MG tablet TAKE ONE TABLET BY MOUTH IN THE EVENING 30 tablet 3  . B Complex-C (B-COMPLEX WITH VITAMIN C) tablet Take 1 tablet by mouth daily.    . Blood Glucose Monitoring Suppl (ACCU-CHEK NANO SMARTVIEW) W/DEVICE KIT Use to check blood sugar 3 times per day dx code E11.65 1 kit 0  . carbidopa-levodopa (SINEMET IR) 25-100 MG per tablet Take 1 tablet by mouth 3 (three) times daily.     . carvedilol (COREG) 25 MG tablet Take 1 tablet (25 mg total) by mouth 2 (two) times daily. 180 tablet 3  . cholecalciferol (VITAMIN D) 1000 units tablet Take 1,000 Units by mouth daily.    . cholestyramine (QUESTRAN) 4 G packet Take 4 g by mouth 2 (two) times daily as needed.    .  empagliflozin (JARDIANCE) 10 MG TABS tablet Take 10 mg by mouth daily with breakfast. 30 tablet 3  . furosemide (LASIX) 40 MG tablet Take 40 mg twice daily on M/W/F and 40 mg once daily all other days 60 tablet 0  . insulin aspart (NOVOLOG FLEXPEN) 100 UNIT/ML FlexPen Inject4- 6 units before breakfast and lunch and 10-12 units before dinner (Patient taking differently: Inject 6 units before breakfast and lunch and 12 units before dinner) 30 mL 3  . Insulin Glargine (TOUJEO SOLOSTAR) 300 UNIT/ML SOPN Inject 40 Units into the skin daily. (Patient taking differently: Inject 36 Units into the skin daily. ) 18 mL 1  . Insulin Pen Needle 31G X 8 MM MISC Use to inject medication 4 times daily 400 each 1  . isosorbide mononitrate (IMDUR) 30 MG 24 hr tablet TAKE 1 TABLET (30 MG TOTAL) BY MOUTH DAILY. 90 tablet 3  . ketorolac (ACULAR) 0.4 % SOLN Place 1 drop into the left eye 4 (four) times daily.    Marland Kitchen lamoTRIgine (LAMICTAL) 100 MG tablet Take 100 mg by mouth daily.    Marland Kitchen liraglutide (VICTOZA) 18 MG/3ML SOPN INJECT 1.8MG DAILY (NEEDS APPOINTMENT FOR REFILLS) 9 mL 0  . meloxicam (MOBIC) 15 MG tablet Take 7.5 mg by mouth daily.     . Multiple Vitamins-Minerals (MULTIVITAMIN WITH MINERALS) tablet Take 1 tablet by mouth daily.    Marland Kitchen  ofloxacin (OCUFLOX) 0.3 % ophthalmic solution Place 1 drop into the left eye 4 (four) times daily.    Marland Kitchen olmesartan (BENICAR) 40 MG tablet Take 40 mg by mouth daily.    . Pancrelipase, Lip-Prot-Amyl, 25000 UNITS CPEP Take 5 capsules by mouth 3 (three) times daily. 2 caps at breakfast, 1 at lunch, 2 at dinner    . potassium chloride SA (K-DUR,KLOR-CON) 20 MEQ tablet Take 20 mEq by mouth daily.      Marland Kitchen PREDNISOLONE ACETATE OP Place 1 drop into the left eye 4 (four) times daily.    . pregabalin (LYRICA) 50 MG capsule Take 50 mg by mouth 2 (two) times daily.    . primidone (MYSOLINE) 50 MG tablet Take 25 mg by mouth at bedtime.     . sertraline (ZOLOFT) 50 MG tablet Take 50 mg by mouth  daily.     No current facility-administered medications for this visit.     Functional Status:  In your present state of health, do you have any difficulty performing the following activities: 07/23/2016 06/22/2016  Hearing? N N  Vision? Y Y  Difficulty concentrating or making decisions? N N  Walking or climbing stairs? Y N  Dressing or bathing? N N  Doing errands, shopping? Y N  Preparing Food and eating ? N N  Using the Toilet? N N  In the past six months, have you accidently leaked urine? Y N  Do you have problems with loss of bowel control? N N  Managing your Medications? N N  Managing your Finances? N N  Housekeeping or managing your Housekeeping? N N  Some recent data might be hidden    Fall/Depression Screening: PHQ 2/9 Scores 07/23/2016 06/22/2016 05/02/2016 03/29/2016 06/02/2015 05/13/2015  PHQ - 2 Score 0 0 1 1 0 0   THN CM Care Plan Problem One   Flowsheet Row Most Recent Value  Care Plan Problem One  Knowledge deficit in self management of diabetes  Role Documenting the Problem One  [place] for Problem One  Active  THN Long Term Goal (31-90 days)  Patient will be able to verbalize a decrease in her A1c from 8.4 within 90 days  THN Long Term Goal Start Date  07/23/16  Interventions for Problem One Long Term Goal  RN sent patient educational material on living with diabetes. RN discussed maintaining her diet, monitoring blood sugars. RN will follow up within discussion and teach back  THN CM Short Term Goal #1 (0-30 days)  Patient will verbalize eating healthy snacks and healthy meals within the next 30 days  THN CM Short Term Goal #1 Start Date  07/23/16  Interventions for Short Term Goal #1  RN discussed eating 3 meals a day and 2 healthy snacks. RN sent a list of healthy snacks. RN will follow up with discussion and teach back  THN CM Short Term Goal #2 (0-30 days)  Patient will report knowing the signs and symptoms of hypo and hyperglycemia within the  next 30 days  THN CM Short Term Goal #2 Start Date  07/23/16      Assessment: Patient will benefit from [place] telephonic outreach for education and support for diabetes self management.   Plan:  RN sent Living well with diabetes book RN sent EMMI on low and high blood sugar RN sent a list of healthy snacks RN sent a 2018 calendar book for documentation of blood sugars and questions RN sent picture chart of faces of  diabetes RN sent EMMI educational material on rotating insulin sites RN discussed insulin site rotation and problems that can occur if sites not rotated. RN will follow up within the month of February   [place] Management [phone]

## 2016-08-02 DIAGNOSIS — M19072 Primary osteoarthritis, left ankle and foot: Secondary | ICD-10-CM | POA: Diagnosis not present

## 2016-08-02 DIAGNOSIS — M2141 Flat foot [pes planus] (acquired), right foot: Secondary | ICD-10-CM | POA: Diagnosis not present

## 2016-08-02 DIAGNOSIS — M19071 Primary osteoarthritis, right ankle and foot: Secondary | ICD-10-CM | POA: Diagnosis not present

## 2016-08-02 DIAGNOSIS — M2142 Flat foot [pes planus] (acquired), left foot: Secondary | ICD-10-CM | POA: Diagnosis not present

## 2016-08-14 ENCOUNTER — Other Ambulatory Visit: Payer: Self-pay | Admitting: Endocrinology

## 2016-08-20 DIAGNOSIS — N08 Glomerular disorders in diseases classified elsewhere: Secondary | ICD-10-CM | POA: Diagnosis not present

## 2016-08-20 DIAGNOSIS — N182 Chronic kidney disease, stage 2 (mild): Secondary | ICD-10-CM | POA: Diagnosis not present

## 2016-08-20 DIAGNOSIS — E1140 Type 2 diabetes mellitus with diabetic neuropathy, unspecified: Secondary | ICD-10-CM | POA: Diagnosis not present

## 2016-08-20 DIAGNOSIS — I129 Hypertensive chronic kidney disease with stage 1 through stage 4 chronic kidney disease, or unspecified chronic kidney disease: Secondary | ICD-10-CM | POA: Diagnosis not present

## 2016-08-20 DIAGNOSIS — E1122 Type 2 diabetes mellitus with diabetic chronic kidney disease: Secondary | ICD-10-CM | POA: Diagnosis not present

## 2016-08-22 ENCOUNTER — Other Ambulatory Visit: Payer: Self-pay | Admitting: *Deleted

## 2016-08-22 NOTE — Patient Outreach (Signed)
[place] [facility]) Care Management  08/22/2016  [name] 02/08/1946 [id number]   RN Health Coach  attempted #1 Follow up outreach call to patient.  Patient was unavailable. HIPPA compliance voicemail message was left with return callback number.  Plan: RN will call patient again within 14 days.   [place] Care Management [phone]

## 2016-08-28 ENCOUNTER — Other Ambulatory Visit: Payer: Self-pay | Admitting: Pharmacist

## 2016-08-28 ENCOUNTER — Ambulatory Visit: Payer: Self-pay | Admitting: Pharmacist

## 2016-08-28 NOTE — Patient Outreach (Signed)
[place] [facility]) Care Management  08/28/2016  BRAXTON SLEDGE Jul 15, 1945 [id number]  71 year old female referred to [place] for medication review due to patient being on >15 medications. Called today [facility] telephone follow-up from last visit but was unable to reach patient via telephone and have left HIPAA compliant voicemail asking her to return my call.  Will followup via telephone in 1-3 days.  [name], PharmD, [place] PGY2 Pharmacy Resident [phone]

## 2016-08-29 ENCOUNTER — Ambulatory Visit: Payer: Self-pay | Admitting: Pharmacist

## 2016-09-03 DIAGNOSIS — N182 Chronic kidney disease, stage 2 (mild): Secondary | ICD-10-CM | POA: Diagnosis not present

## 2016-09-03 DIAGNOSIS — I129 Hypertensive chronic kidney disease with stage 1 through stage 4 chronic kidney disease, or unspecified chronic kidney disease: Secondary | ICD-10-CM | POA: Diagnosis not present

## 2016-09-03 DIAGNOSIS — R569 Unspecified convulsions: Secondary | ICD-10-CM | POA: Diagnosis not present

## 2016-09-03 DIAGNOSIS — M25561 Pain in right knee: Secondary | ICD-10-CM | POA: Diagnosis not present

## 2016-09-05 ENCOUNTER — Other Ambulatory Visit: Payer: Self-pay | Admitting: *Deleted

## 2016-09-05 ENCOUNTER — Other Ambulatory Visit: Payer: Self-pay | Admitting: Pharmacist

## 2016-09-05 ENCOUNTER — Ambulatory Visit: Payer: Self-pay | Admitting: Pharmacist

## 2016-09-05 NOTE — Patient Outreach (Signed)
[place] [facility]) Care Management  09/05/2016  [name] 10/23/1945 [id number]  71 y.o. year old female referred to [place] for Medication Management (Pharmacy Telephone Followup) Was unable to reach patient via telephone today and have left HIPAA compliant voicemail asking him to return my call (unsuccessful outreach #2).  Plan: Followup in 7 days via telephone  [name], PharmD, [place] PGY2 Pharmacy Resident [phone]

## 2016-09-05 NOTE — Patient Outreach (Signed)
[place] [facility]) Care Management  09/05/2016  [name] 1946-01-07 [id number]   RN Health Coach attempted#2  Follow up outreach call to patient.  Patient was unavailable. HIPPA compliance voicemail message was left with return callback number.   Plan: RN will call patient again within 14 days.  [place] Care Management [phone]

## 2016-09-05 NOTE — Patient Outreach (Signed)
[facility]) Care Management  09/05/2016   [name] [birth year] [id number]   RN Health Coach received return  telephone call from patient.  [employer] compliance verified. Per patient she is doing good. Patient is exercising 3 x a week.Patient A1C has decreased from 8.4 to 7.1. Patient has received the educational material from [employer] and reviewed. Per patient she is doing better with her diet but still is eating some foods she knows she shouldn't have. Patient stated her blood sugar was 112 fasting today. Per patient she is having some pain in her feet and her knee . RN discussed with patient if she has insoles from store or orthotics. Per patient she bought them at the store. Per patient she has not started rotating injection sites. Patient has agreed to follow up outreach calls.   Current Medications:  Current Outpatient Prescriptions  Medication Sig Dispense Refill  . ACCU-CHEK FASTCLIX LANCETS MISC USE  TO CHECK BLOOD SUGAR THREE TIMES DAILY 306 each 0  . ACCU-CHEK SMARTVIEW test strip CHECK BLOOD SUGAR THREE TIMES DAILY 300 each 1  . Alcohol Swabs (B-D SINGLE USE SWABS REGULAR) PADS Use as directed 300 each 1  . ARIPiprazole (ABILIFY) 10 MG tablet Take 10 mg by mouth daily.    Marland Kitchen aspirin EC 81 MG tablet Take 1 tablet (81 mg total) by mouth at bedtime.    Marland Kitchen atorvastatin (LIPITOR) 40 MG tablet TAKE ONE TABLET BY MOUTH IN THE EVENING 30 tablet 3  . B Complex-C (B-COMPLEX WITH VITAMIN C) tablet Take 1 tablet by mouth daily.    . Blood Glucose Monitoring Suppl (ACCU-CHEK NANO SMARTVIEW) W/DEVICE KIT Use to check blood sugar 3 times per day dx code E11.65 1 kit 0  . carbidopa-levodopa (SINEMET IR) 25-100 MG per tablet Take 1 tablet by mouth 3 (three) times daily.     . carvedilol (COREG) 25 MG tablet Take 1 tablet (25 mg total) by mouth 2 (two) times daily. 180 tablet 3  . cholecalciferol (VITAMIN D) 1000 units tablet Take 1,000 Units by mouth daily.    .  cholestyramine (QUESTRAN) 4 G packet Take 4 g by mouth 2 (two) times daily as needed.    . furosemide (LASIX) 40 MG tablet Take 40 mg twice daily on M/W/F and 40 mg once daily all other days 60 tablet 0  . insulin aspart (NOVOLOG FLEXPEN) 100 UNIT/ML FlexPen Inject4- 6 units before breakfast and lunch and 10-12 units before dinner (Patient taking differently: Inject 6 units before breakfast and lunch and 12 units before dinner) 30 mL 3  . Insulin Glargine (TOUJEO SOLOSTAR) 300 UNIT/ML SOPN Inject 40 Units into the skin daily. (Patient taking differently: Inject 36 Units into the skin daily. ) 18 mL 1  . Insulin Pen Needle 31G X 8 MM MISC Use to inject medication 4 times daily 400 each 1  . isosorbide mononitrate (IMDUR) 30 MG 24 hr tablet TAKE 1 TABLET (30 MG TOTAL) BY MOUTH DAILY. 90 tablet 3  . JARDIANCE 10 MG TABS tablet TAKE ONE TABLET BY MOUTH ONCE DAILY WITH  BREAKFAST 30 tablet 3  . ketorolac (ACULAR) 0.4 % SOLN Place 1 drop into the left eye 4 (four) times daily.    Marland Kitchen lamoTRIgine (LAMICTAL) 100 MG tablet Take 100 mg by mouth daily.    Marland Kitchen liraglutide (VICTOZA) 18 MG/3ML SOPN INJECT 1.8MG DAILY (NEEDS APPOINTMENT FOR REFILLS) 9 mL 0  . meloxicam (MOBIC) 15 MG tablet Take 7.5 mg by mouth  daily.     . Multiple Vitamins-Minerals (MULTIVITAMIN WITH MINERALS) tablet Take 1 tablet by mouth daily.    Marland Kitchen ofloxacin (OCUFLOX) 0.3 % ophthalmic solution Place 1 drop into the left eye 4 (four) times daily.    Marland Kitchen olmesartan (BENICAR) 40 MG tablet Take 40 mg by mouth daily.    . Pancrelipase, Lip-Prot-Amyl, 25000 UNITS CPEP Take 5 capsules by mouth 3 (three) times daily. 2 caps at breakfast, 1 at lunch, 2 at dinner    . potassium chloride SA (K-DUR,KLOR-CON) 20 MEQ tablet Take 20 mEq by mouth daily.      Marland Kitchen PREDNISOLONE ACETATE OP Place 1 drop into the left eye 4 (four) times daily.    . pregabalin (LYRICA) 50 MG capsule Take 50 mg by mouth 2 (two) times daily.    . primidone (MYSOLINE) 50 MG tablet Take 25 mg  by mouth at bedtime.     . sertraline (ZOLOFT) 50 MG tablet Take 50 mg by mouth daily.     No current facility-administered medications for this visit.     Functional Status:  In your present state of health, do you have any difficulty performing the following activities: 09/05/2016 07/23/2016  Hearing? N N  Vision? Y Y  Difficulty concentrating or making decisions? N N  Walking or climbing stairs? Y Y  Dressing or bathing? N N  Doing errands, shopping? [name]  Preparing Food and eating ? N N  Using the Toilet? N N  In the past six months, have you accidently leaked urine? Y Y  Do you have problems with loss of bowel control? N N  Managing your Medications? N N  Managing your Finances? N N  Housekeeping or managing your Housekeeping? N N  Some recent data might be hidden    Fall/Depression Screening: PHQ 2/9 Scores 09/05/2016 07/23/2016 06/22/2016 05/02/2016 03/29/2016 06/02/2015 05/13/2015  PHQ - 2 Score 0 0 0 1 1 0 0   THN CM Care Plan Problem One   Flowsheet Row Most Recent Value  Care Plan Problem One  knowledge deficit in self management of diabetes  Role Documenting the Problem One  Health Coach  THN Long Term Goal (31-90 days)  Patient will be able to verbalize a decrease in her A1c from 8.4 within 90 days  THN Long Term Goal Start Date  09/05/16  [facility] Long Term Goal Met Date  09/05/16  Interventions for Problem One Long Term Goal  RN sent patient educational material on living with diabetes. RN discussed maintaining her diet, monitoring blood sugars. RN will follow up within discussion and teach back  THN CM Short Term Goal #1 (0-30 days)  Patient will verbalize eating healthy snacks and healthy meals within the next 30 days  THN CM Short Term Goal #1 Start Date  07/23/16  Interventions for Short Term Goal #1  RN discussed eating 3 meals a day and 2 healthy snacks. RN sent a list of healthy snacks. RN will send a list of diabetic foods patient can order forom a food chain/RN will  follow up with discussion and teach back  THN CM Short Term Goal #2 (0-30 days)  Patient will report knowing the signs and symptoms of hypo and hyperglycemia within the next 30 days  THN CM Short Term Goal #2 Met Date  09/05/16  THN CM Short Term Goal #3 (0-30 days)  Patient will report rotating injection sites within the next 30 days  THN CM Short Term Goal #3 Start Date  09/05/16  Interventions for Short Tern Goal #3  RN sent patient picture chart of rotating sites. RN discussed sites for patient to rotate to.  RN will follow up with patient      Assessment: A1C is 7.1 Patient will continue to  benefit from Alex telephonic outreach for education and support for diabetes self management.   Plan:  RN sent list of diabetic foods to order form food chains RN discussed injection site rotation RN discussed seeing foot specialist or orthopaedist RN will follow up within the month of April  Johny Shock BSN RN [place] Care Management [phone]

## 2016-09-06 ENCOUNTER — Encounter: Payer: Self-pay | Admitting: *Deleted

## 2016-09-11 ENCOUNTER — Ambulatory Visit: Payer: Self-pay | Admitting: Pharmacist

## 2016-09-11 DIAGNOSIS — G20 Parkinson's disease: Secondary | ICD-10-CM | POA: Diagnosis not present

## 2016-09-13 ENCOUNTER — Ambulatory Visit (INDEPENDENT_AMBULATORY_CARE_PROVIDER_SITE_OTHER): Payer: Medicare HMO | Admitting: Cardiology

## 2016-09-13 ENCOUNTER — Encounter: Payer: Self-pay | Admitting: Cardiology

## 2016-09-13 VITALS — BP 141/76 | HR 68 | Ht 60.0 in | Wt 276.0 lb

## 2016-09-13 DIAGNOSIS — E785 Hyperlipidemia, unspecified: Secondary | ICD-10-CM | POA: Diagnosis not present

## 2016-09-13 DIAGNOSIS — I2510 Atherosclerotic heart disease of native coronary artery without angina pectoris: Secondary | ICD-10-CM

## 2016-09-13 DIAGNOSIS — I110 Hypertensive heart disease with heart failure: Secondary | ICD-10-CM

## 2016-09-13 DIAGNOSIS — R000 Tachycardia, unspecified: Secondary | ICD-10-CM

## 2016-09-13 DIAGNOSIS — I5032 Chronic diastolic (congestive) heart failure: Secondary | ICD-10-CM | POA: Diagnosis not present

## 2016-09-13 DIAGNOSIS — R0609 Other forms of dyspnea: Secondary | ICD-10-CM | POA: Diagnosis not present

## 2016-09-13 DIAGNOSIS — Z6841 Body Mass Index (BMI) 40.0 and over, adult: Secondary | ICD-10-CM

## 2016-09-13 DIAGNOSIS — E662 Morbid (severe) obesity with alveolar hypoventilation: Secondary | ICD-10-CM

## 2016-09-13 MED ORDER — LOSARTAN POTASSIUM 25 MG PO TABS: 25.0000 mg | ORAL_TABLET | Freq: Every day | ORAL | 6 refills | Status: DC

## 2016-09-13 MED ORDER — FUROSEMIDE 40 MG PO TABS: 40.0000 mg | ORAL_TABLET | Freq: Every day | ORAL | 6 refills | Status: DC

## 2016-09-13 NOTE — Progress Notes (Signed)
PCP: [name], MD  Clinic Note: Chief Complaint  Patient presents with  . Follow-up    3 months;HTN & palpitations  . Shortness of Breath    occasionally; DOE  . Edema    in feet    HPI: [name] is a 71 y.o. female with a PMH (Notable for nonocclusive CAD by cath in 2012, hypertension, obesity with obesity hypoventilation syndrome and "chronic diastolic heart failure". She is also had near syncope with palpitations).  She is here for about 3 month follow-up for her hypertension.  I last saw her in December 2017. She was doing relatively well but with some sharp episodes of chest discomfort. Was still in the process of going through evaluation for OHS/OSA with CPAP. I had increased her carvedilol to twice a day, but I think she's been taking 2 tablets twice a day. She was also not taking her Lasix quite the same way as it is anticipated - taking it 20 mg a day and 40 mg on Monday Wednesday Friday.  Recent Hospitalizations: None  Studies Reviewed:   Myoview August 2017: LOW RISK. No ischemia or infarction.  Interval History: [name] presents today for follow-up really doing okay. She still has shortness of breath with exertion, but not with routine activity. She has chronic small amount of trace edema in her legs. Also mild orthopnea partially related to her body habitus. She has some dizziness. But also when she has a headache. Axillary she's been trying to exercise and has been getting up to about 20 minutes of walking with a colleague.  She's not having any more the sharp chest pains that she was having before. I didn't find any more information about her sleep evaluation.  No further presyncopal type symptoms that she had before. The quivering sensations in her chest also notably improved with the increased dose of beta blocker. No TIA or amaurosis fugax symptoms.  ROS: A comprehensive was performed. Review of Systems  Constitutional: Positive for malaise/fatigue  (Doesn't feel rested after sleep). Negative for weight loss.  HENT: Negative for congestion, hearing loss and nosebleeds.   Respiratory: Positive for shortness of breath. Negative for cough and wheezing.   Cardiovascular: Positive for chest pain (Sharp tinges of discomfort at rest or with deep inspiration). Negative for palpitations (Notable improvement).  Gastrointestinal: Negative.  Negative for abdominal pain, blood in stool, melena, nausea and vomiting.  Genitourinary: Negative for hematuria.  Neurological: Negative for dizziness, focal weakness, loss of consciousness (No further presyncope) and weakness.  Endo/Heme/Allergies: Negative for environmental allergies.  Psychiatric/Behavioral: Negative for depression and memory loss. The patient is not nervous/anxious and does not have insomnia.   All other systems reviewed and are negative.   Past Medical History:  Diagnosis Date  . Abnormal liver function     in the past.  . Anemia   . Arthritis    all over  . Bruises easily   . Cataract    right eye;immature  . Chronic back pain    stenosis  . Chronic cough   . Demand myocardial infarction 2012   Demand Infarction in setting of Pancreatitis --> mild Troponin elevation, Non-obstructive CAD  . Depression    takes Abilify daily as well as Zoloft  . Diastolic heart failure 3382   Grade 1 diastolic Dysfunction by Echo   . Diverticulosis   . DM (diabetes mellitus) ([place])    takes Victoza daily as well as Lantus and Humalog  . Empty sella ([place])  on MRI in 2009.  . Glaucoma   . Headache(784.0)    last migraine-4-35yr ago  . History of blood transfusion    no abnormal reaction noted  . History of colon polyps    benign  . History of influenza    antibiotic given yesterday along with taking Delsym cough syrup  . HTN (hypertension)    takes BKinder Morgan Energydaily  . Hyperlipidemia    takes Lipitor daily  . Joint swelling   . Nocturia   . Obstructive sleep apnea     . Pancreatitis    takes Pancrelipase daily  . Parkinson's disease ([place]    takes Sinemet daily  . Peripheral edema    takes Lasix daily  . [place]ipheral neuropathy ([place]   . Pneumonia 2012  . Seizure disorder ([place]   . Seizures ([place]    takes Lamictal daily and Primidone nightly;last seizure 2wks ago  . Urinary frequency   . Urinary urgency     Past Surgical History:  Procedure Laterality Date  . ABDOMINAL HYSTERECTOMY    . APPENDECTOMY    . BACK SURGERY    . CARDIAC CATHETERIZATION  2009/March 2012   2009: (Dr. KAlvie [place] Nonobstructive CAD; 2012: Minimal CAD --> false positive stress test  . Cardiac Event Monitor  September-October 2017   Sinus rhythm with occasional PACs and artifact. No arrhythmias besides one short run of tachycardia.  . CHOLECYSTECTOMY    . COLONOSCOPY N/A 08/15/2012   Procedure: COLONOSCOPY;  Surgeon: WArta Silence MD;  Location: [facility] ENDOSCOPY;  Service: Endoscopy;  Laterality: N/A;  . ESOPHAGOGASTRODUODENOSCOPY    . eye cysts Bilateral   . LASIK    . NM MYOVIEW LTD  March 2012; Aug 2017   Probably normal Lexiscan Myoview. Mild reversible Anterior Defect ~ Breast Attenuation. (CRO ischemia):  FALSE POSITIVE by Cath; b) 8/'17: LOW RISK, No ischemia or Infarction.  Breast Attenuation not noted.  .Marland KitchenNM MYOVIEW LTD  01/2016   LOW RISK. No ischemia or infarction.  .Marland KitchenRECTAL POLYPECTOMY    . TONSILLECTOMY    . TRANSTHORACIC ECHOCARDIOGRAM  01/2016   Normal LV chamber size with moderate LVH pattern. EF 50-55%. Severe LA dilation. Moderate RA dilation. PA pressure elevated at 38 mmHg (mild as (  . TUBAL LIGATION    . vaginal cyst removed     several times    Current Meds  Medication Sig  . ACCU-CHEK FASTCLIX LANCETS MISC USE  TO CHECK BLOOD SUGAR THREE TIMES DAILY  . ACCU-CHEK SMARTVIEW test strip CHECK BLOOD SUGAR THREE TIMES DAILY  . Alcohol Swabs (B-D SINGLE USE SWABS REGULAR) PADS Use as directed  . ARIPiprazole (ABILIFY) 10 MG tablet Take 10 mg by  mouth daily.  .Marland Kitchenaspirin EC 81 MG tablet Take 1 tablet (81 mg total) by mouth at bedtime.  .Marland Kitchenatorvastatin (LIPITOR) 40 MG tablet TAKE ONE TABLET BY MOUTH IN THE EVENING  . B Complex-C (B-COMPLEX WITH VITAMIN C) tablet Take 1 tablet by mouth daily.  . Blood Glucose Monitoring Suppl (ACCU-CHEK NANO SMARTVIEW) W/DEVICE KIT Use to check blood sugar 3 times per day dx code E11.65  . carbidopa-levodopa (SINEMET IR) 25-100 MG per tablet Take 1 tablet by mouth 3 (three) times daily.   . carvedilol (COREG) 25 MG tablet Take 1 tablet (25 mg total) by mouth 2 (two) times daily.  . cholecalciferol (VITAMIN D) 1000 units tablet Take 1,000 Units by mouth daily.  . cholestyramine (QUESTRAN) 4 G packet Take 4 g by mouth  2 (two) times daily as needed.  . furosemide (LASIX) 40 MG tablet Take 1 tablet (40 mg total) by mouth daily.  . insulin aspart (NOVOLOG FLEXPEN) 100 UNIT/ML FlexPen Inject4- 6 units before breakfast and lunch and 10-12 units before dinner (Patient taking differently: Inject 6 units before breakfast and lunch and 12 units before dinner)  . Insulin Glargine (TOUJEO SOLOSTAR) 300 UNIT/ML SOPN Inject 40 Units into the skin daily. (Patient taking differently: Inject 36 Units into the skin daily. )  . Insulin Pen Needle 31G X 8 MM MISC Use to inject medication 4 times daily  . isosorbide mononitrate (IMDUR) 30 MG 24 hr tablet TAKE 1 TABLET (30 MG TOTAL) BY MOUTH DAILY.  Marland Kitchen JARDIANCE 10 MG TABS tablet TAKE ONE TABLET BY MOUTH ONCE DAILY WITH  BREAKFAST  . ketorolac (ACULAR) 0.4 % SOLN Place 1 drop into the left eye 4 (four) times daily.  Marland Kitchen lamoTRIgine (LAMICTAL) 100 MG tablet Take 100 mg by mouth daily.  Marland Kitchen liraglutide (VICTOZA) 18 MG/3ML SOPN INJECT 1.'8MG'$  DAILY (NEEDS APPOINTMENT FOR REFILLS)  . Multiple Vitamins-Minerals (MULTIVITAMIN WITH MINERALS) tablet Take 1 tablet by mouth daily.  Marland Kitchen ofloxacin (OCUFLOX) 0.3 % ophthalmic solution Place 1 drop into the left eye 4 (four) times daily.  Marland Kitchen olmesartan  (BENICAR) 40 MG tablet Take 40 mg by mouth daily.  . Pancrelipase, Lip-Prot-Amyl, 25000 UNITS CPEP Take 5 capsules by mouth 3 (three) times daily. 2 caps at breakfast, 1 at lunch, 2 at dinner  . potassium chloride SA (K-DUR,KLOR-CON) 20 MEQ tablet Take 20 mEq by mouth daily.    Marland Kitchen PREDNISOLONE ACETATE OP Place 1 drop into the left eye 4 (four) times daily.  . pregabalin (LYRICA) 100 MG capsule Take 100 mg by mouth 2 (two) times daily.  . primidone (MYSOLINE) 50 MG tablet Take 25 mg by mouth at bedtime.   . sertraline (ZOLOFT) 50 MG tablet Take 50 mg by mouth daily.  . [DISCONTINUED] furosemide (LASIX) 40 MG tablet Take 40 mg twice daily on M/W/F and 40 mg once daily all other days  . [DISCONTINUED] meloxicam (MOBIC) 15 MG tablet Take 7.5 mg by mouth daily.   . [DISCONTINUED] pregabalin (LYRICA) 50 MG capsule Take 50 mg by mouth 2 (two) times daily.    Allergies  Allergen Reactions  . Penicillins Hives  . Sulfa Antibiotics Hives  . Codeine     Headache and "just feel off"  . Other Rash    [place]    Social History   Social History  . Marital status: Divorced    Spouse name: N/A  . Number of children: N/A  . Years of education: N/A   Occupational History  . Disabled    Social History Main Topics  . Smoking status: Former Smoker    Packs/day: 0.50    Years: 25.00    Types: Cigarettes  . Smokeless tobacco: Never Used     Comment: quit smoking 20+ytrs ago  . Alcohol use No  . Drug use: No  . Sexual activity: No   Other Topics Concern  . None   Social History Narrative   She lives in [place]. She has lots of family in the area and is accompanied by her sister.   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   She has a distant history of smoking, quit 20 years ago.       family history includes Allergies in her father; Cancer in her other; Diabetes in  her brother, mother, sister, and son; Heart disease in her father, mother, and sister;  Heart failure in her father; Hyperlipidemia in her father, mother, and sister; Hypertension in her brother, father, mother, sister, sister, and son.  Wt Readings from Last 3 Encounters:  09/13/16 125.2 kg (276 lb)  06/11/16 126.7 kg (279 lb 6.4 oz)  05/22/16 125.2 kg (276 lb)    PHYSICAL EXAM BP (!) 141/76   Pulse 68   Ht 5' (1.524 m)   Wt 125.2 kg (276 lb)   BMI 53.90 kg/m  General appearance: alert, cooperative, appears stated age, no distress and Morbidly obese Neck: no adenopathy, no carotid bruit and no JVD Lungs: clear to auscultation bilaterally, normal percussion bilaterally and non-labored Heart: regular rate and rhythm; distant S1& S2 normal, soft SEM & HSM @ LSB; No, click, rub or gallop; Palpate PMI Abdomen: soft, non-tender; bowel sounds normal; no masses; morbidly obese. Cannot palpate hepatomegaly. Extremities: extremities normal, atraumatic, no cyanosis, and edema trace Pulses: 2+ and symmetric;  Skin: mobility and turgor normal, no evidence of bleeding or bruising and no lesions noted  Neurologic: Mental status: Alert, oriented, thought content appropriate Cranial nerves: normal (II-XII grossly intact) Psych: anxious mood & affect.      Adult ECG Report n/a   Other studies Reviewed: Additional studies/ records that were reviewed today include:  Recent Labs:   Lab Results  Component Value Date   CREATININE 0.84 05/15/2016   BUN 11 05/15/2016   NA 135 05/15/2016   K 3.9 05/15/2016   CL 99 05/15/2016   CO2 29 05/15/2016    ASSESSMENT / PLAN: Problem List Items Addressed This Visit    Chronic diastolic congestive heart failure (HCC) (Chronic)    She still has exertional dyspnea, which is probably more related to her obesity. Despite that she has still notable edema. She wasn't taking Lasix like I wanted. I wonder to take 40 mg daily and then an additional dose if she has more swelling than usual. She may undertake twice a day 3 days a week she was  initially supposed to be taking.  I accidentally did not see that she was taking on losartan and started losartan. I will have this changed to hydralazine in combination with her nitrate. I also instructed her to take only one carvedilol twice a day.      Relevant Medications   furosemide (LASIX) 40 MG tablet   losartan (COZAAR) 25 MG tablet   Other Relevant Orders   BASIC METABOLIC PANEL WITH GFR   BASIC METABOLIC PANEL WITH GFR   Coronary artery disease, non-occlusive (Chronic)    Negative Myoview in the past. She is on aspirin, beta blocker, statin and ARB. Continue with plans for weight loss and glycemic control.      Relevant Medications   furosemide (LASIX) 40 MG tablet   losartan (COZAAR) 25 MG tablet   DOE (dyspnea on exertion)    Multifactorial with some component of hypertensive heart disease/diastolic dysfunction. Mostly related to obesity and deconditioning. Plan as adequate blood pressure control and increased diuretic.      Relevant Medications   furosemide (LASIX) 40 MG tablet   Dyslipidemia, goal LDL below 100 (Chronic)    On statin. Monitored by PCP.      Relevant Medications   furosemide (LASIX) 40 MG tablet   losartan (COZAAR) 25 MG tablet   Hypertensive heart disease - Primary (Chronic)    Blood pressure still not quite at goal despite the fact  that she is taking 50 mg of carvedilol twice a day. Reduce her back down to the 25 mg twice a day carvedilol. Will add hydralazine 25 mg twice a day.  With the adjustments in her diuretic underwent a recheck a BMP now and then in 3 weeks prior to seeing our clinical pharmacists in the hypertension clinic. Her blood pressure medications and further titrated.      Relevant Medications   furosemide (LASIX) 40 MG tablet   losartan (COZAAR) 25 MG tablet   Morbid obesity with BMI of 50.0-59.9, adult (HCC) (Chronic)    I encouraged her to continue her exercise routine. She has orthotic braces for her ankles. - Probably  related to weight.      Obesity hypoventilation syndrome (HCC) (Chronic)    Currently undergoing evaluation for CPAP.      Racing heart beat      Patient Instructions  Medication Instructions: START hydralazine 25 mg twice daily. (This is a correction from losartan ordered during clinic visit)  --Take Carvedilol 1 tablet twice daily. --Take Lasix (Furosemide) 40 mg daily--Take a 2nd dose in the morning if you still have swelling after 1st dose.   Labwork: Your physician recommends that you return for lab work: BMET after you start taking losartan and in 3 weeks when you see the Pharmacist.   Follow-Up: Your physician recommends that you schedule a follow-up appointment in 3 weeks with PharmD.  Your physician wants you to follow-up in: 6 months with Dr. Ellyn Hack. You will receive a reminder letter in the mail two months in advance. If you don't receive a letter, please call our office to schedule the follow-up appointment.  If you need a refill on your cardiac medications before your next appointment, please call your pharmacy.    Studies Ordered:   Orders Placed This Encounter  Procedures  . BASIC METABOLIC PANEL WITH GFR  . BASIC METABOLIC PANEL WITH GFR      Glenetta Hew, M.D., M.S. Interventional Cardiologist   Pager # [phone] Phone # [phone] [address] [place], [place] [address]

## 2016-09-13 NOTE — Patient Instructions (Addendum)
Medication Instructions: START hydralazine 25 mg twice daily. (This is a correction from losartan ordered during clinic visit)  --Take Carvedilol 1 tablet twice daily. --Take Lasix (Furosemide) 40 mg daily--Take a 2nd dose in the morning if you still have swelling after 1st dose.   Labwork: Your physician recommends that you return for lab work: BMET after you start taking losartan and in 3 weeks when you see the Pharmacist.   Follow-Up: Your physician recommends that you schedule a follow-up appointment in 3 weeks with PharmD.  Your physician wants you to follow-up in: 6 months with Dr. Ellyn Hack. You will receive a reminder letter in the mail two months in advance. If you don't receive a letter, please call our office to schedule the follow-up appointment.  If you need a refill on your cardiac medications before your next appointment, please call your pharmacy.

## 2016-09-15 ENCOUNTER — Encounter: Payer: Self-pay | Admitting: Cardiology

## 2016-09-15 NOTE — Assessment & Plan Note (Signed)
I encouraged her to continue her exercise routine. She has orthotic braces for her ankles. - Probably related to weight.

## 2016-09-15 NOTE — Assessment & Plan Note (Signed)
Negative Myoview in the past. She is on aspirin, beta blocker, statin and ARB. Continue with plans for weight loss and glycemic control.

## 2016-09-15 NOTE — Assessment & Plan Note (Signed)
Currently undergoing evaluation for CPAP.

## 2016-09-15 NOTE — Assessment & Plan Note (Signed)
She still has exertional dyspnea, which is probably more related to her obesity. Despite that she has still notable edema. She wasn't taking Lasix like I wanted. I wonder to take 40 mg daily and then an additional dose if she has more swelling than usual. She may undertake twice a day 3 days a week she was initially supposed to be taking.  I accidentally did not see that she was taking on losartan and started losartan. I will have this changed to hydralazine in combination with her nitrate. I also instructed her to take only one carvedilol twice a day.

## 2016-09-15 NOTE — Assessment & Plan Note (Signed)
Recommend support stockings and continue Lasix

## 2016-09-15 NOTE — Assessment & Plan Note (Signed)
Multifactorial with some component of hypertensive heart disease/diastolic dysfunction. Mostly related to obesity and deconditioning. Plan as adequate blood pressure control and increased diuretic.

## 2016-09-15 NOTE — Assessment & Plan Note (Addendum)
Blood pressure still not quite at goal despite the fact that she is taking 50 mg of carvedilol twice a day. Reduce her back down to the 25 mg twice a day carvedilol. Will add hydralazine 25 mg twice a day.  With the adjustments in her diuretic underwent a recheck a BMP now and then in 3 weeks prior to seeing our clinical pharmacists in the hypertension clinic. Her blood pressure medications and further titrated.

## 2016-09-15 NOTE — Assessment & Plan Note (Signed)
On statin. Monitored by PCP. 

## 2016-09-17 DIAGNOSIS — G8929 Other chronic pain: Secondary | ICD-10-CM | POA: Diagnosis not present

## 2016-09-17 DIAGNOSIS — M25561 Pain in right knee: Secondary | ICD-10-CM | POA: Diagnosis not present

## 2016-09-17 DIAGNOSIS — M1711 Unilateral primary osteoarthritis, right knee: Secondary | ICD-10-CM | POA: Diagnosis not present

## 2016-09-18 DIAGNOSIS — H2511 Age-related nuclear cataract, right eye: Secondary | ICD-10-CM | POA: Diagnosis not present

## 2016-09-19 ENCOUNTER — Ambulatory Visit: Payer: Self-pay | Admitting: *Deleted

## 2016-09-19 ENCOUNTER — Other Ambulatory Visit: Payer: Self-pay | Admitting: Pharmacist

## 2016-09-19 NOTE — Patient Outreach (Addendum)
[facility]) Care Management  09/19/2016  OLEVA KOO 1945/11/30 [id number]  71 y.o. year old female referred to [place] for Medication Management (Pharmacy Telephone Followup).  Pharmacy telephone followup performed today with patient. Reports adherence to her medications.  Reports occasionally missing mid day medications.      Right Knee Arthritis pain - Reports moderate knee pain today.  She is still exercising but this has decreased. States tylenol (4000 mg per day) doesn't seem to help and has tried ice packs.  She is awaiting a lidocaine patch prior authorization and currently also taking Norco.  She has moved up to 30 minutes exercise 3 days a week at home.  Following last [place] visit Lyrica dose was increased.  Reports dose increase from 50 mg twice daily to 100 mg twice daily has not helped a lot but may be influenced by knee pain.  Parkinsons/Tremor - Patient has stopped primidone by neurology.  She states she did have a slight tremor this morning.  She reports tremors on primidone still happened but were not as bad as before.  States this mornings tremor was no worse than on primidone.    Diabetes - A1C 7.3% 08/20/2016 Current Weight (per patient): 279 lbs Reports lowest CBG (fasting): 76 mg/dL.  Reports most fasting CBGs in low 100s.   Lunch CBGs: reports lowest value of 70.  79/104/82/190/130/72/89/97 mg/dL  Before Dinner CBGs: lowest value of 68    110/205/111/85/74/112/132/116/79 Reports weakness/shakiness with hypoglycemia episodes and does have hypoglycemia awareness.  Reports proper treatment.   Hypertension:  Patient reports Dr Ellyn Hack started as needed furosemide.  Patient restarted losartan 3/22 per patient.   She confirms duplicate therapy with olmesartan and losartan for 6 days.  She is currently not checking home BP.    A/P: -Will contact Dr Ellyn Hack to consider stopping losartan as patient on duplicate angiotensin receptor blocker  therapy.  [place] and they confirmed patient filled Losartan on 09/13/16.   -Instructed patient to limit Tylenol Extra Strength to 2 tablets (1000 mg) three times daily as needed to avoid going over maximum of 4000 mg per day from all sources including Hydrocodone/Acetaminophen.  -Counseled on signs/symptoms/treatment of hypoglycema -Encouraged patient to continue checking CBGs and to start checking blood pressure at home as well. -Will followup via telephone with patient once Dr Ellyn Hack confirms dose.   [name], PharmD, BCPS [facility] PGY2 Pharmacy Resident [phone]   [facility] CM Care Plan Problem One     Most Recent Value  Care Plan Problem One  Knowledge deficit related to medication adherence as evidenced by patient report of missed medications  Role Documenting the Problem One  Clinical Pharmacist  Care Plan for Problem One  Active  THN CM Short Term Goal #1 (0-30 days)  Patient will have >85% adherence to her medications as evidenced by patient report over the next 30 days  THN CM Short Term Goal #1 Start Date  09/19/16  Interventions for Short Term Goal #1  Discussed continung to utilize phone alarm reminders and importance of adherence to regimen.

## 2016-09-24 DIAGNOSIS — H25811 Combined forms of age-related cataract, right eye: Secondary | ICD-10-CM | POA: Diagnosis not present

## 2016-09-24 DIAGNOSIS — H2511 Age-related nuclear cataract, right eye: Secondary | ICD-10-CM | POA: Diagnosis not present

## 2016-09-25 ENCOUNTER — Other Ambulatory Visit: Payer: Self-pay | Admitting: Pharmacist

## 2016-09-25 ENCOUNTER — Telehealth: Payer: Self-pay | Admitting: Pharmacist Clinician (PhC)/ Clinical Pharmacy Specialist

## 2016-09-25 MED ORDER — HYDRALAZINE HCL 25 MG PO TABS: 25.0000 mg | ORAL_TABLET | Freq: Two times a day (BID) | ORAL | 3 refills | Status: DC

## 2016-09-25 NOTE — Telephone Encounter (Signed)
Reviewed OV note from Dr. Ellyn Hack.  He wanted to cancel losartan and add hydralazine, however losartan was ordered in error.   Will contact patient and have her start the hydralazine 25 mg twice daily.  She is to keep OV with CVRR

## 2016-09-25 NOTE — Telephone Encounter (Signed)
-----   Message from Kem Parkinson, [facility] sent at 09/25/2016  4:08 PM EDT ----- Regarding: FW: Duplicate ARB Therapy  [name], This patient was referred by Dr Ellyn Hack to see you in a couple weeks. He had some confusion in his note and started her on losartan while she was already on olmesartan.  He then wanted to start on Hydralazine 25 mg BID but has not sent this in to the pharmacy.  Could you send this in or forward to his nurse for me?  I have already asked her to stop the losartan  Thanks [name], PharmD, [place] PGY2 Pharmacy Resident [phone]  ----- Message ----- From: Leonie Man, MD Sent: 09/21/2016   2:05 PM To: Kem Parkinson, RPH Subject: RE: Duplicate ARB Therapy                      Use Hydral instead to the Losartan.  DH ----- Message ----- From: Kem Parkinson, RPH Sent: 09/21/2016   8:55 AM To: Leonie Man, MD Subject: RE: Duplicate ARB Therapy                      Do you still want to start the hydralazine 25 mg twice daily?  I can notify patient if you send in the prescription and still want to start this.   Thanks [name], PharmD, [place] PGY2 Pharmacy Resident [phone]  ----- Message ----- From: Leonie Man, MD Sent: 09/20/2016   3:04 PM To: Kem Parkinson, RPH Subject: RE: Duplicate ARB Therapy                      I inadvertantly started Losartan (the olmesartan was not on the med list I had) -- pls d/c losartan.  We planned to try a different type of med. DH ----- Message ----- From: Kem Parkinson, [place] Sent: 09/19/2016  12:02 PM To: Leonie Man, MD Subject: Duplicate ARB Therapy                          Dr Ellyn Hack, I called [name] today for [facility] pharmacy followup.  She is currently taking Losartan 25 mg daily and Olmesartan 40 mg daily.  Would you like to stop the Losartan?    It looks like from your note you mentioned the losartan was a mistake and you wanted to add hydralazine but the losartan was sent in.  If you  would like to start Hydralazine could you send to [facility] on [address]?   Thanks [name], PharmD, [place] PGY2 Pharmacy Resident [phone]

## 2016-09-25 NOTE — Patient Outreach (Signed)
[facility]) Care Management  09/25/2016  [name] 25-Mar-1946 [id number]  71 year old female referred to [name] for medication management.  Received notification from Dr [name] to stop duplicate Losartan and to start Hydralazine.    Patient reports she has decreased her Tylenol usage to 1000 mg twice daily.  She still has knee pain and is awaiting prior authorization for Lidocaine patches.    She has started checking her blood pressure but is not currently at home to tell me the values  Plan Explained prior authorization process for Lidocaine patches to patient Instructed patient to stop Losartan as she is on duplicate Angiotensin Receptor Blockers per Dr [name] Pharmacist with Dr [name] office has sent in Hydralazine 25 mg twice daily.  I have notified patient and she demonstrated understanding of medication changes.  Will followup next week to assist with lidocaine patches and consider signing off at this time if patient is adherent to her medications.   [name], PharmD, [place] PGY2 Pharmacy Resident [phone]

## 2016-09-27 ENCOUNTER — Other Ambulatory Visit: Payer: Self-pay | Admitting: *Deleted

## 2016-09-27 NOTE — Telephone Encounter (Signed)
Left message for patient to call back  

## 2016-09-27 NOTE — Telephone Encounter (Signed)
Hydralazine refilled. Per [place]'s verbal instruction.

## 2016-10-03 ENCOUNTER — Other Ambulatory Visit: Payer: Self-pay | Admitting: Pharmacist

## 2016-10-04 ENCOUNTER — Ambulatory Visit: Payer: Medicare HMO

## 2016-10-04 ENCOUNTER — Other Ambulatory Visit: Payer: Self-pay | Admitting: Endocrinology

## 2016-10-04 DIAGNOSIS — I5032 Chronic diastolic (congestive) heart failure: Secondary | ICD-10-CM | POA: Diagnosis not present

## 2016-10-04 LAB — BASIC METABOLIC PANEL WITH GFR
BUN: 12 mg/dL (ref 7–25)
CO2: 24 mmol/L (ref 20–31)
Calcium: 8.9 mg/dL (ref 8.6–10.4)
Chloride: 100 mmol/L (ref 98–110)
Creat: 0.91 mg/dL (ref 0.50–0.99)
GFR, Est African American: 75 mL/min (ref >=60)
GFR, Est Non African American: 65 mL/min (ref >=60)
Glucose, Bld: 117 mg/dL — ABNORMAL HIGH (ref 65–99)
Potassium: 4.4 mmol/L (ref 3.5–5.3)
Sodium: 135 mmol/L (ref 135–146)

## 2016-10-04 NOTE — Progress Notes (Deleted)
Patient ID: [name]                 DOB: 05-Nov-1945                      MRN: [id number]     HPI: [name] is a 71 y.o. female referred by Dr. Ellyn Hack to HTN clinic.  PMH includes chronic diastolic heart failure, CAD, hyperlipidemia, and hypertension.   Current HTN meds:  Cravedilol 28m twice daily Hydralazine 234mtwice daily Furosemide 4079maily (extra 49m81m swelling in the morning)  Previously tried:  Ibesartan 37.5mg 2mly Isosorbide mononitrate 30mg 33my Losartan 25mg d63m nebivolol 10mg da23mValsartan 349mg dai89mBP goal: 130/80  Family History:   Social History:   Diet:   Exercise:   Home BP readings:   Wt Readings from Last 3 Encounters:  09/13/16 276 lb (125.2 kg)  06/11/16 279 lb 6.4 oz (126.7 kg)  05/22/16 276 lb (125.2 kg)   BP Readings from Last 3 Encounters:  09/13/16 (!) 141/76  06/22/16 (!) 161/96  06/11/16 (!) 189/86   Pulse Readings from Last 3 Encounters:  09/13/16 68  06/22/16 63  06/11/16 72    Renal function: CrCl cannot be calculated (Patient's most recent lab result is older than the maximum 21 days allowed.).  Past Medical History:  Diagnosis Date  . Abnormal liver function     in the past.  . Anemia   . Arthritis    all over  . Bruises easily   . Cataract    right eye;immature  . Chronic back pain    stenosis  . Chronic cough   . Demand myocardial infarction 2012   Demand Infarction in setting of Pancreatitis --> mild Troponin elevation, Non-obstructive CAD  . Depression    takes Abilify daily as well as Zoloft  . Diastolic heart failure 2010   [id number] diastolic Dysfunction by Echo   . Diverticulosis   . DM (diabetes mellitus) (HCC)    t[place]burghs Victoza daily as well as Lantus and Humalog  . Empty sella (HCC)    [place] in 2009.  . Glaucoma   . Headache(784.0)    last migraine-4-77yrs ago 21yristory of blood transfusion    no abnormal reaction noted  . History of colon polyps    benign  .  History of influenza    antibiotic given yesterday along with taking Delsym cough syrup  . HTN (hypertension)    takes Benicar,ImKinder Morgan EnergyHyperlipidemia    takes Lipitor daily  . Joint swelling   . Nocturia   . Obstructive sleep apnea   . Pancreatitis    takes Pancrelipase daily  . Parkinson's disease (HCC)    [place] daily  . Peripheral edema    takes Lasix daily  . Peripheral neuropathy (HCC)   . PKearnym[place] 2012  . Seizure disorder (HCC)   . SRebeccaures (HCC)    [place] Lamictal daily and Primidone nightly;last seizure 2wks ago  . Urinary frequency   . Urinary urgency     Current Outpatient Prescriptions on File Prior to Visit  Medication Sig Dispense Refill  . ACCU-CHEK FASTCLIX LANCETS MISC USE  TO CHECK BLOOD SUGAR THREE TIMES DAILY 306 each 0  . ACCU-CHEK SMARTVIEW test strip CHECK BLOOD SUGAR THREE TIMES DAILY 300 each 1  . acetaminophen (TYLENOL) 500 MG tablet Take 500 mg by mouth every 4 (four) hours as needed.     .Marland Kitchen  Alcohol Swabs (B-D SINGLE USE SWABS REGULAR) PADS Use as directed 300 each 1  . ARIPiprazole (ABILIFY) 10 MG tablet Take 10 mg by mouth daily.    Marland Kitchen aspirin EC 81 MG tablet Take 1 tablet (81 mg total) by mouth at bedtime.    Marland Kitchen atorvastatin (LIPITOR) 40 MG tablet TAKE ONE TABLET BY MOUTH IN THE EVENING 30 tablet 3  . B Complex-C (B-COMPLEX WITH VITAMIN C) tablet Take 1 tablet by mouth daily.    . Blood Glucose Monitoring Suppl (ACCU-CHEK NANO SMARTVIEW) W/DEVICE KIT Use to check blood sugar 3 times per day dx code E11.65 1 kit 0  . carbidopa-levodopa (SINEMET IR) 25-100 MG per tablet Take 1 tablet by mouth 3 (three) times daily.     . carvedilol (COREG) 25 MG tablet Take 1 tablet (25 mg total) by mouth 2 (two) times daily. 180 tablet 3  . cholecalciferol (VITAMIN D) 1000 units tablet Take 1,000 Units by mouth daily.    . cholestyramine (QUESTRAN) 4 G packet Take 4 g by mouth 2 (two) times daily as needed.    . furosemide (LASIX) 40 MG tablet  Take 1 tablet (40 mg total) by mouth daily. 30 tablet 6  . hydrALAZINE (APRESOLINE) 25 MG tablet Take 1 tablet (25 mg total) by mouth 2 (two) times daily. 60 tablet 3  . HYDROcodone-acetaminophen (NORCO/VICODIN) 5-325 MG tablet Take 1 tablet by mouth every 6 (six) hours as needed for moderate pain.    Marland Kitchen insulin aspart (NOVOLOG FLEXPEN) 100 UNIT/ML FlexPen Inject4- 6 units before breakfast and lunch and 10-12 units before dinner (Patient taking differently: Inject 6 units before breakfast and lunch and 12 units before dinner) 30 mL 3  . Insulin Glargine (TOUJEO SOLOSTAR) 300 UNIT/ML SOPN Inject 40 Units into the skin daily. (Patient taking differently: Inject 36 Units into the skin daily. ) 18 mL 1  . Insulin Pen Needle 31G X 8 MM MISC Use to inject medication 4 times daily 400 each 1  . isosorbide mononitrate (IMDUR) 30 MG 24 hr tablet TAKE 1 TABLET (30 MG TOTAL) BY MOUTH DAILY. 90 tablet 3  . JARDIANCE 10 MG TABS tablet TAKE ONE TABLET BY MOUTH ONCE DAILY WITH  BREAKFAST 30 tablet 3  . ketorolac (ACULAR) 0.4 % SOLN Place 1 drop into the left eye 4 (four) times daily.    Marland Kitchen lamoTRIgine (LAMICTAL) 100 MG tablet Take 100 mg by mouth daily.    Marland Kitchen liraglutide (VICTOZA) 18 MG/3ML SOPN INJECT 1.8MG DAILY (NEEDS APPOINTMENT FOR REFILLS) 9 mL 0  . Multiple Vitamins-Minerals (MULTIVITAMIN WITH MINERALS) tablet Take 1 tablet by mouth daily.    Marland Kitchen ofloxacin (OCUFLOX) 0.3 % ophthalmic solution Place 1 drop into the left eye 4 (four) times daily.    Marland Kitchen olmesartan (BENICAR) 40 MG tablet Take 40 mg by mouth daily.    . Pancrelipase, Lip-Prot-Amyl, 25000 UNITS CPEP Take 5 capsules by mouth 3 (three) times daily. 2 caps at breakfast, 1 at lunch, 2 at dinner    . potassium chloride SA (K-DUR,KLOR-CON) 20 MEQ tablet Take 20 mEq by mouth daily.      Marland Kitchen PREDNISOLONE ACETATE OP Place 1 drop into the left eye 4 (four) times daily.    . pregabalin (LYRICA) 100 MG capsule Take 100 mg by mouth 2 (two) times daily.    .  sertraline (ZOLOFT) 50 MG tablet Take 50 mg by mouth daily.     No current facility-administered medications on file prior to visit.  Allergies  Allergen Reactions  . Penicillins Hives  . Sulfa Antibiotics Hives  . Codeine     Headache and "just feel off"  . Other Rash    Bleach    There were no vitals taken for this visit.  No problem-specific Assessment & Plan notes found for this encounter.   Rodriguez-Guzman PharmD, [place] [id number] 10/04/2016 11:41 AM

## 2016-10-05 NOTE — Patient Outreach (Signed)
[facility]) Care Management  10/03/2016  [name] 07/09/1945 [id number]  70 y.o. year old female referred to [facility] for Medication Management (Pharmacy Telephone Followup) Was unable to reach patient via telephone today and have left HIPAA compliant voicemail asking him to return my call (unsuccessful outreach #1).  Per patients pharmacy her Lidocaine patches have not yet been appproved by prior authorization.  The fax was sent to her provider on 10/01/16.    Plan: Followup in 1-2 weeks via telephone  [name], PharmD, [place] PGY2 Pharmacy Resident [phone]

## 2016-10-08 ENCOUNTER — Other Ambulatory Visit: Payer: Self-pay | Admitting: *Deleted

## 2016-10-08 NOTE — Patient Outreach (Signed)
[facility]) Care Management  10/08/2016   [name] 09/16/1945 [id number] RN Health Coach telephone call to patient.  Hipaa compliance verified. Per patient her fasting blood sugar was 105. Patient stated that her blood sugar had dropped to 62 and she became shaky and her vision became blurry. Patient stated that she ate dinner and felt much better. Per patient she went to the [facility] to exercise. She stated she was having so much pain in her knee and feet that she had to stop riding the bike. Per patient her brace is wearing out and she needs another cortisone shoe. RN discussed with patient about calling for an appointment with the Orthopedic and taking the brace so that he can see it. RN discussed with patient about ordering from the [place]. Per patient she didn't know anything about it. Patient has agreed to follow up outreach calls.   Current Medications:  Current Outpatient Prescriptions  Medication Sig Dispense Refill  . ACCU-CHEK FASTCLIX LANCETS MISC USE  TO CHECK BLOOD SUGAR THREE TIMES DAILY 306 each 0  . ACCU-CHEK SMARTVIEW test strip CHECK BLOOD SUGAR THREE TIMES DAILY 300 each 1  . acetaminophen (TYLENOL) 500 MG tablet Take 500 mg by mouth every 4 (four) hours as needed.     . Alcohol Swabs (B-D SINGLE USE SWABS REGULAR) PADS Use as directed 300 each 1  . ARIPiprazole (ABILIFY) 10 MG tablet Take 10 mg by mouth daily.    Marland Kitchen aspirin EC 81 MG tablet Take 1 tablet (81 mg total) by mouth at bedtime.    Marland Kitchen atorvastatin (LIPITOR) 40 MG tablet TAKE ONE TABLET BY MOUTH IN THE EVENING 30 tablet 3  . B Complex-C (B-COMPLEX WITH VITAMIN C) tablet Take 1 tablet by mouth daily.    . Blood Glucose Monitoring Suppl (ACCU-CHEK NANO SMARTVIEW) W/DEVICE KIT Use to check blood sugar 3 times per day dx code E11.65 1 kit 0  . carbidopa-levodopa (SINEMET IR) 25-100 MG per tablet Take 1 tablet by mouth 3 (three) times daily.     . carvedilol (COREG) 25 MG tablet Take 1 tablet  (25 mg total) by mouth 2 (two) times daily. 180 tablet 3  . cholecalciferol (VITAMIN D) 1000 units tablet Take 1,000 Units by mouth daily.    . cholestyramine (QUESTRAN) 4 G packet Take 4 g by mouth 2 (two) times daily as needed.    . furosemide (LASIX) 40 MG tablet Take 1 tablet (40 mg total) by mouth daily. 30 tablet 6  . hydrALAZINE (APRESOLINE) 25 MG tablet Take 1 tablet (25 mg total) by mouth 2 (two) times daily. 60 tablet 3  . HYDROcodone-acetaminophen (NORCO/VICODIN) 5-325 MG tablet Take 1 tablet by mouth every 6 (six) hours as needed for moderate pain.    Marland Kitchen insulin aspart (NOVOLOG FLEXPEN) 100 UNIT/ML FlexPen Inject4- 6 units before breakfast and lunch and 10-12 units before dinner (Patient taking differently: Inject 6 units before breakfast and lunch and 12 units before dinner) 30 mL 3  . Insulin Glargine (TOUJEO SOLOSTAR) 300 UNIT/ML SOPN Inject 40 Units into the skin daily. (Patient taking differently: Inject 36 Units into the skin daily. ) 18 mL 1  . Insulin Pen Needle 31G X 8 MM MISC Use to inject medication 4 times daily 400 each 1  . isosorbide mononitrate (IMDUR) 30 MG 24 hr tablet TAKE 1 TABLET (30 MG TOTAL) BY MOUTH DAILY. 90 tablet 3  . JARDIANCE 10 MG TABS tablet TAKE ONE TABLET BY MOUTH ONCE  DAILY WITH  BREAKFAST 30 tablet 3  . ketorolac (ACULAR) 0.4 % SOLN Place 1 drop into the left eye 4 (four) times daily.    Marland Kitchen lamoTRIgine (LAMICTAL) 100 MG tablet Take 100 mg by mouth daily.    . Multiple Vitamins-Minerals (MULTIVITAMIN WITH MINERALS) tablet Take 1 tablet by mouth daily.    Marland Kitchen ofloxacin (OCUFLOX) 0.3 % ophthalmic solution Place 1 drop into the left eye 4 (four) times daily.    Marland Kitchen olmesartan (BENICAR) 40 MG tablet Take 40 mg by mouth daily.    . Pancrelipase, Lip-Prot-Amyl, 25000 UNITS CPEP Take 5 capsules by mouth 3 (three) times daily. 2 caps at breakfast, 1 at lunch, 2 at dinner    . potassium chloride SA (K-DUR,KLOR-CON) 20 MEQ tablet Take 20 mEq by mouth daily.      Marland Kitchen  PREDNISOLONE ACETATE OP Place 1 drop into the left eye 4 (four) times daily.    . pregabalin (LYRICA) 100 MG capsule Take 100 mg by mouth 2 (two) times daily.    . sertraline (ZOLOFT) 50 MG tablet Take 50 mg by mouth daily.    Marland Kitchen VICTOZA 18 MG/3ML SOPN INJECT 1.8MG SUBCUTANEOUSLY DAILY (NEEDS APPOINTMENT FOR REFILLS) 27 mL 0   No current facility-administered medications for this visit.     Functional Status:  In your present state of health, do you have any difficulty performing the following activities: 10/08/2016 09/05/2016  Hearing? N N  Vision? Y Y  Difficulty concentrating or making decisions? N N  Walking or climbing stairs? Y Y  Dressing or bathing? N N  Doing errands, shopping? [name]  Preparing Food and eating ? N N  Using the Toilet? N N  In the past six months, have you accidently leaked urine? Y Y  Do you have problems with loss of bowel control? N N  Managing your Medications? N N  Managing your Finances? N N  Housekeeping or managing your Housekeeping? N N  Some recent data might be hidden    Fall/Depression Screening: PHQ 2/9 Scores 10/08/2016 09/05/2016 07/23/2016 06/22/2016 05/02/2016 03/29/2016 06/02/2015  PHQ - 2 Score 0 0 0 0 1 1 0   THN CM Care Plan Problem One     Most Recent Value  Care Plan Problem One  knowledge deficit in self management of diabetes  Role Documenting the Problem One  [place] for Problem One  Active  THN Long Term Goal (31-90 days)  Patient will be able to return to exercise program withn the next 90 days  THN Long Term Goal Start Date  10/08/16  Interventions for Problem One Long Term Goal  RN discussed with patient about contacting physician for ortho conditions. RN discussed talking with ortho about new brace since prior brace worn out. RN will follow up with care   THN CM Short Term Goal #1 (0-30 days)  Patient will verbalize eating healthy snacks and healthy meals within the next 30 days  THN CM Short Term Goal #1 Start Date   10/08/16  Interventions for Short Term Goal #1  RN discussed eating 3 meals a day and 2 healthy snacks. RN sent a list of healthy snacks. RN will send a list of diabetic foods patient can order forom a food chain/RN will follow up with discussion and teach back  THN CM Short Term Goal #3 (0-30 days)  Patient will report rotating injection sites within the next 30 days  THN CM Short Term Goal #3 Start  Date  10/08/16  Interventions for Short Tern Goal #3  RN sent patient picture chart of rotating sites. RN discussed sites for patient to rotate to.  RN will follow up with patient       Assessment: Patient is having foot and knee pain making it difficut to continue her exercise routine Patient experiences hypoglycemia reaction Patient will continue to benefit from [place] telephonic outreach for education and support for diabetes self management.    Plan:  RN discussed with patient about following up with orthopedic RN discussed symptoms patient experienced with hypoglycemia  RN reiterated  treatment for hypoglycemia RN discussed ordering from [place] will follow up within the month of May   [place] Management [phone]

## 2016-10-09 ENCOUNTER — Other Ambulatory Visit: Payer: Self-pay | Admitting: Pharmacist

## 2016-10-09 ENCOUNTER — Encounter: Payer: Self-pay | Admitting: *Deleted

## 2016-10-09 NOTE — Patient Outreach (Signed)
[facility]) Care Management  10/09/2016  [name] June 03, 1946 [id number]   71 y.o. year old female referred to [facility] for Medication Management (Pharmacy Telephone Followup).  Called patient today to followup her lidocaine patches and medication adherence.  Patient reports adherence to medications but due to volume of medications requests home visit to review medications.  Patient reports she has started Hydralazine and stopped losartan.  She reports she does not have her lidocaine patches and is still waiting for prior authorization.    Discussed lidocaine 4% patches with patient (available from [facility] for ~$10) but patient states she cannot afford these at this time.    [place] and prior authorization has not been approved (faxed prior authorization on 4/9 and 4/11 per pharmacist).  Lidocaine patch was written by Gerrit Halls, PA at [facility].     Plan: -Will schedule home visit tomorrow for medication management and to assess medication adherence.   -Called Gerrit Halls, PA to notify of prior authorization -Will notify patient of $30 OTC allowance for [employer] (However, Lidocaine patches not available via online catalog)  [name], PharmD, [place] PGY2 Pharmacy Resident [phone]

## 2016-10-10 ENCOUNTER — Encounter: Payer: Self-pay | Admitting: Pharmacist

## 2016-10-10 ENCOUNTER — Other Ambulatory Visit: Payer: Self-pay | Admitting: Pharmacist

## 2016-10-16 DIAGNOSIS — K861 Other chronic pancreatitis: Secondary | ICD-10-CM | POA: Diagnosis not present

## 2016-10-16 DIAGNOSIS — Z86010 Personal history of colonic polyps: Secondary | ICD-10-CM | POA: Diagnosis not present

## 2016-10-23 NOTE — Patient Outreach (Signed)
[place] [facility]) Care Management  [place]   10/10/2016  [name] [birth year] [id number]  Subjective: 71 year old female referred to [place] for medication review due to patient being on >15 medications. [facility] pharmacy home visit performed today to review medications and access medication adherence.    Today patient reports adherence to her medications.  She reports only missing 2 or 3 lunchtime doses of her medications over the past 2 weeks due to being out of the house.  She reports the phone alarm has been helpful for her and she is now using a pill box.    Reports knee pain and has not started lidocaine patches which were prescribed by orthopedics due to prior authorization not being approved.  She states she has cut back on Tylenol to the recommended 4 gram max per day.  Her lyrica dose increase may have helped pain some but she still has significant pain.    Reports shortness of breath with exertion. She states adherence to a low sodium diet and to weighing herself regularly as well as the 3 and 5 lb rule.   Patient reports 1-2 hypoglycemic episodes per month with proper treatment of those low blood glucose values.   She has not been regularly monitoring her home blood pressure values.   Objective:   Encounter Medications: Outpatient Encounter Prescriptions as of 10/10/2016  Medication Sig Note  . ACCU-CHEK FASTCLIX LANCETS MISC USE  TO CHECK BLOOD SUGAR THREE TIMES DAILY   . ACCU-CHEK SMARTVIEW test strip CHECK BLOOD SUGAR THREE TIMES DAILY   . acetaminophen (TYLENOL) 500 MG tablet Take 500 mg by mouth every 4 (four) hours as needed.    . Alcohol Swabs (B-D SINGLE USE SWABS REGULAR) PADS Use as directed   . ARIPiprazole (ABILIFY) 10 MG tablet Take 10 mg by mouth daily.   Marland Kitchen aspirin EC 81 MG tablet Take 1 tablet (81 mg total) by mouth at bedtime.   Marland Kitchen atorvastatin (LIPITOR) 40 MG tablet TAKE ONE TABLET BY MOUTH IN THE EVENING 10/23/2016: Taking 10 mg  tablets  . B Complex-C (B-COMPLEX WITH VITAMIN C) tablet Take 1 tablet by mouth daily.   . Blood Glucose Monitoring Suppl (ACCU-CHEK NANO SMARTVIEW) W/DEVICE KIT Use to check blood sugar 3 times per day dx code E11.65   . carbidopa-levodopa (SINEMET IR) 25-100 MG per tablet Take 1 tablet by mouth 3 (three) times daily.  06/22/2016: Often misses lunch time.   . carvedilol (COREG) 25 MG tablet Take 1 tablet (25 mg total) by mouth 2 (two) times daily.   . cholecalciferol (VITAMIN D) 1000 units tablet Take 1,000 Units by mouth daily.   . cholestyramine (QUESTRAN) 4 G packet Take 4 g by mouth 2 (two) times daily as needed.   . furosemide (LASIX) 40 MG tablet Take 1 tablet (40 mg total) by mouth daily. 09/19/2016: Additional 40 mg as needed for swelling  . hydrALAZINE (APRESOLINE) 25 MG tablet Take 1 tablet (25 mg total) by mouth 2 (two) times daily.   Marland Kitchen HYDROcodone-acetaminophen (NORCO/VICODIN) 5-325 MG tablet Take 1 tablet by mouth every 6 (six) hours as needed for moderate pain.   Marland Kitchen insulin aspart (NOVOLOG FLEXPEN) 100 UNIT/ML FlexPen Inject4- 6 units before breakfast and lunch and 10-12 units before dinner (Patient taking differently: Inject 6 units before breakfast and lunch and 12 units before dinner)   . Insulin Glargine (TOUJEO SOLOSTAR) 300 UNIT/ML SOPN Inject 40 Units into the skin daily. (Patient taking differently: Inject  36 Units into the skin daily. )   . Insulin Pen Needle 31G X 8 MM MISC Use to inject medication 4 times daily   . isosorbide mononitrate (IMDUR) 30 MG 24 hr tablet TAKE 1 TABLET (30 MG TOTAL) BY MOUTH DAILY.   Marland Kitchen JARDIANCE 10 MG TABS tablet TAKE ONE TABLET BY MOUTH ONCE DAILY WITH  BREAKFAST   . lamoTRIgine (LAMICTAL) 100 MG tablet Take 100 mg by mouth daily.   . Multiple Vitamins-Minerals (MULTIVITAMIN WITH MINERALS) tablet Take 1 tablet by mouth daily.   Marland Kitchen olmesartan (BENICAR) 40 MG tablet Take 40 mg by mouth daily.   . Pancrelipase, Lip-Prot-Amyl, 25000 UNITS CPEP Take 5  capsules by mouth 3 (three) times daily. 2 caps at breakfast, 1 at lunch, 2 at dinner   . potassium chloride SA (K-DUR,KLOR-CON) 20 MEQ tablet Take 20 mEq by mouth daily.     . pregabalin (LYRICA) 100 MG capsule Take 100 mg by mouth 2 (two) times daily.   . sertraline (ZOLOFT) 50 MG tablet Take 50 mg by mouth daily.   Marland Kitchen VICTOZA 18 MG/3ML SOPN INJECT 1.8MG SUBCUTANEOUSLY DAILY (NEEDS APPOINTMENT FOR REFILLS)   . ketorolac (ACULAR) 0.4 % SOLN Place 1 drop into the left eye 4 (four) times daily.   Marland Kitchen ofloxacin (OCUFLOX) 0.3 % ophthalmic solution Place 1 drop into the left eye 4 (four) times daily.   Marland Kitchen PREDNISOLONE ACETATE OP Place 1 drop into the left eye 4 (four) times daily.    No facility-administered encounter medications on file as of 10/10/2016.     Functional Status: In your present state of health, do you have any difficulty performing the following activities: 10/08/2016 09/05/2016  Hearing? N N  Vision? Y Y  Difficulty concentrating or making decisions? N N  Walking or climbing stairs? Y Y  Dressing or bathing? N N  Doing errands, shopping? [name]  Preparing Food and eating ? N N  Using the Toilet? N N  In the past six months, have you accidently leaked urine? Y Y  Do you have problems with loss of bowel control? N N  Managing your Medications? N N  Managing your Finances? N N  Housekeeping or managing your Housekeeping? N N  Some recent data might be hidden    Fall/Depression Screening: Fall Risk  10/08/2016 09/05/2016 07/23/2016  Falls in the past year? Yes Yes -  Number falls in past yr: 2 or more 2 or more 2 or more  Injury with Fall? No No No  Risk Factor Category  High Fall Risk High Fall Risk High Fall Risk  Risk for fall due to : History of fall(s);Impaired balance/gait;Impaired mobility;Impaired vision History of fall(s);Impaired balance/gait;Impaired mobility;Impaired vision Impaired vision;History of fall(s);Impaired mobility;Impaired balance/gait  Follow up Education  provided;Falls prevention discussed;Falls evaluation completed Falls evaluation completed;Education provided;Falls prevention discussed Falls evaluation completed;Education provided;Falls prevention discussed   PHQ 2/9 Scores 10/08/2016 09/05/2016 07/23/2016 06/22/2016 05/02/2016 03/29/2016 06/02/2015  PHQ - 2 Score 0 0 0 0 1 1 0   Assessment: Drugs sorted by system: Neurologic/Psychologic: aripiprazole, carbidopa/levodopa, lamictal, pregabalin, sertraline  Cardiovascular: aspirin, atorvastatin, carvedilol, furosemide, hydralazine, isosorbide, olmesartan, potassium  Gastrointestinal: cholestyramine, Zenpep   Endocrine: Novolog, Toujeo, Jardiance, Victoza  Pain: hydrocodone/acetaminophen  Vitamins/Minerals: vitamin B complex, cholecalciferol, multivitamin   Other issues noted:  Patient prescribed atorvastatin 40 mg but currently taking atorvastatin 10 mg daily   Plan: -Mailed [employer] as patient may receive some of these medications free every quarter.  -  Recommended OTC Lidocaine patch as the prescription one is unlikely to be approved as it is non-formulary. Patient plans to pick this up at start of month when she can afford them for ~$10.  -Encouraged patient to check blood pressure 3 to 4 times per week and to write values down. -Will followup with cardiologist about Atorvastatin 10 mg vs 40 mg  -Will followup via telephone in 4 weeks and consider signing off at this visit.    [name], PharmD, BCPS [facility] PGY2 Pharmacy Resident [phone]  [facility] CM Care Plan Problem One     Most Recent Value  Care Plan Problem One  Knowledge deficit related to medication adherence as evidenced by patient report of missed medications  Role Documenting the Problem One  Clinical Pharmacist  Care Plan for Problem One  Active  [facility] CM Short Term Goal #1 (0-30 days)  Patient will have >85% adherence to her medications as evidenced by patient report over the next 30 days  [facility] CM Short  Term Goal #1 Start Date  09/19/16  [facility] CM Short Term Goal #1 Met Date  10/10/16  Interventions for Short Term Goal #1  Discussed continung to utilize phone alarm reminders and importance of adherence to regimen.

## 2016-10-24 DIAGNOSIS — Z961 Presence of intraocular lens: Secondary | ICD-10-CM | POA: Diagnosis not present

## 2016-11-07 ENCOUNTER — Ambulatory Visit: Payer: Self-pay | Admitting: *Deleted

## 2016-11-08 ENCOUNTER — Other Ambulatory Visit: Payer: Self-pay | Admitting: *Deleted

## 2016-11-08 ENCOUNTER — Telehealth: Payer: Self-pay | Admitting: Cardiology

## 2016-11-08 NOTE — Telephone Encounter (Signed)
Pt of Dr. Ellyn Hack CHF, CRF, DOE  Sister called on behalf of patient citing progressive SOB over last month or so & urgency of SOB today. Also reports swelling in legs in evening.  Unfortunately she does not know if patient is taking meds as instructed (pt states yes - takes 40mg  lasix in AM, but did not recall instruction that she may take 2 tabs of lasix in AM if needed).  Pt does not track daily weights, last weight of 277 lbs was 3-4 days ago. She does not know her dry/baseline weight. No HR or BP readings from home.  Pt mainly SOB w lying flat and sleeps reclined.  Sister is asking if Dr. Ellyn Hack will order home oxygen (cites that the patient used to be on O2).  I advised that if she feels the patient's SOB is markedly worse today that an evaluation in the hospital ED would be most appropriate action. O/w, will seek advice. She voiced that she will wait for advice from physician.  Routed to [facility]D.

## 2016-11-08 NOTE — Telephone Encounter (Signed)
New Message  Pt c/o Shortness Of Breath: STAT if SOB developed within the last 24 hours or pt is noticeably SOB on the phone  1. Are you currently SOB (can you hear that pt is SOB on the phone)? Yes  2. How long have you been experiencing SOB? Awhile but it has gotten worse  3. Are you SOB when sitting or when up moving around? Moving around  4. Are you currently experiencing any other symptoms? No, but pt cannot lay down in the bed at all

## 2016-11-08 NOTE — Telephone Encounter (Signed)
She should proceed to the ED of SOB is urgent.  Otherwise she should be added on to APP schedule tomorrow or Dr. Ellyn Hack if available.

## 2016-11-08 NOTE — Telephone Encounter (Signed)
Good plan.  She has had relatively normal echocardiogram and Myoview evaluations in the past. Is probably best for her to be seen, because unless we know what she is taking insofar as her meds ago we don't know what to do.  Glenetta Hew, MD

## 2016-11-08 NOTE — Telephone Encounter (Signed)
I've spoken w [name], pt's sister, who voiced understanding of instructions. As advised, if worse, will proceed to ED for eval this weekend. Nothing sooner for provider appt than Monday. I've put her on Rhonda's schedule on open 9:30am slot. Aware that Dr. Allison Quarry schedule is currently full that day but that he will be in office should advisement be needed. Routed to provider as fyi.

## 2016-11-12 ENCOUNTER — Ambulatory Visit (INDEPENDENT_AMBULATORY_CARE_PROVIDER_SITE_OTHER): Payer: Medicare HMO | Admitting: Physician Assistant

## 2016-11-12 ENCOUNTER — Encounter: Payer: Self-pay | Admitting: Physician Assistant

## 2016-11-12 VITALS — BP 150/88 | HR 70 | Ht 60.0 in | Wt 284.0 lb

## 2016-11-12 DIAGNOSIS — Z79899 Other long term (current) drug therapy: Secondary | ICD-10-CM | POA: Diagnosis not present

## 2016-11-12 DIAGNOSIS — I5033 Acute on chronic diastolic (congestive) heart failure: Secondary | ICD-10-CM | POA: Diagnosis not present

## 2016-11-12 DIAGNOSIS — I10 Essential (primary) hypertension: Secondary | ICD-10-CM

## 2016-11-12 LAB — BASIC METABOLIC PANEL
BUN/Creatinine Ratio: 20 (ref 12–28)
BUN: 17 mg/dL (ref 8–27)
CO2: 24 mmol/L (ref 18–29)
Calcium: 9 mg/dL (ref 8.7–10.3)
Chloride: 98 mmol/L (ref 96–106)
Creatinine, Ser: 0.85 mg/dL (ref 0.57–1.00)
GFR calc Af Amer: 81 mL/min/{1.73_m2} (ref >59)
GFR calc non Af Amer: 71 mL/min/{1.73_m2} (ref >59)
Glucose: 69 mg/dL (ref 65–99)
Potassium: 4.2 mmol/L (ref 3.5–5.2)
Sodium: 139 mmol/L (ref 134–144)

## 2016-11-12 NOTE — Progress Notes (Signed)
Cardiology Office Note   Date:  11/12/2016   ID:  [name], DOB 1945/09/04, MRN [id number]  PCP:  [name], MD  Cardiologist:  Dr. Ellyn Hack 09/13/2016  [name] Ferries, PA-C   Chief Complaint  Patient presents with  . Follow-up    edema in feet and legs, dyspnea, pain in legs-since Friday     History of Present Illness: [name] is a 71 y.o. female with a history of nonocclusive CAD by cath in 2012, hypertension, obesity with obesity hypoventilation syndrome and "chronic diastolic heart failure". She is also had near syncope with palpitations, low risk Myoview 01/2016  Phone notes regarding edema and SOB>>appt made.  [name] presents for evaluation of edema and SOB. Her son is with her today  Her weight was 283 on her home scales. She was 276 lbs about a week ago, but has not been weighing daily. She has not had her am meds today and has not eaten.   She admits to sodium indiscretion. A recent meal at [employer] had > 2 gm sodium. She does not think about sodium in foods and eats out frequently.   She has orthopnea, no PND. She has LE edema, worsens during the day, but has been waking with it as well. She has problems with urinary incontinence, especially at night. She has increased DOE.   She has not missed Lasix in the last week. She has taken it late several days. She is taking Lasix 40 mg qd and KCl 20 meq daily.   She is strongly motivated to get better as she wants to go to  Her sister's 50th wedding anniversary party this weekend.   Past Medical History:  Diagnosis Date  . Abnormal liver function     in the past.  . Anemia   . Arthritis    all over  . Bruises easily   . Cataract    right eye;immature  . Chronic back pain    stenosis  . Chronic cough   . Demand myocardial infarction [facility]) 2012   Demand Infarction in setting of Pancreatitis --> mild Troponin elevation, Non-obstructive CAD  . Depression    takes Abilify daily as well as  Zoloft  . Diastolic heart failure 6333   Grade 1 diastolic Dysfunction by Echo   . Diverticulosis   . DM (diabetes mellitus) ([place])    takes Victoza daily as well as Lantus and Humalog  . Empty sella ([place])    on MRI in 2009.  . Glaucoma   . Headache(784.0)    last migraine-4-26yr ago  . History of blood transfusion    no abnormal reaction noted  . History of colon polyps    benign  . History of influenza    antibiotic given yesterday along with taking Delsym cough syrup  . HTN (hypertension)    takes BKinder Morgan Energydaily  . Hyperlipidemia    takes Lipitor daily  . Joint swelling   . Nocturia   . Obstructive sleep apnea   . Pancreatitis    takes Pancrelipase daily  . Parkinson's disease ([place]    takes Sinemet daily  . Peripheral edema    takes Lasix daily  . Peripheral neuropathy   . Pneumonia 2012  . Seizure disorder ([place]   . Seizures ([place]    takes Lamictal daily and Primidone nightly;last seizure 2wks ago  . Urinary frequency   . Urinary urgency     Past Surgical History:  Procedure Laterality  Date  . ABDOMINAL HYSTERECTOMY    . APPENDECTOMY    . BACK SURGERY    . CARDIAC CATHETERIZATION  2009/March 2012   2009: (Dr. Alvie Heidelberg) Nonobstructive CAD; 2012: Minimal CAD --> false positive stress test  . Cardiac Event Monitor  September-October 2017   Sinus rhythm with occasional PACs and artifact. No arrhythmias besides one short run of tachycardia.  . CHOLECYSTECTOMY    . COLONOSCOPY N/A 08/15/2012   Procedure: COLONOSCOPY;  Surgeon: Arta Silence, MD;  Location: [facility] ENDOSCOPY;  Service: Endoscopy;  Laterality: N/A;  . ESOPHAGOGASTRODUODENOSCOPY    . eye cysts Bilateral   . LASIK    . NM MYOVIEW LTD  March 2012; Aug 2017   Probably normal Lexiscan Myoview. Mild reversible Anterior Defect ~ Breast Attenuation. (CRO ischemia):  FALSE POSITIVE by Cath; b) 8/'17: LOW RISK, No ischemia or Infarction.  Breast Attenuation not noted.  Marland Kitchen NM MYOVIEW LTD  01/2016     LOW RISK. No ischemia or infarction.  Marland Kitchen RECTAL POLYPECTOMY    . TONSILLECTOMY    . TRANSTHORACIC ECHOCARDIOGRAM  01/2016   Normal LV chamber size with moderate LVH pattern. EF 50-55%. Severe LA dilation. Moderate RA dilation. PA pressure elevated at 38 mmHg (mild as (  . TUBAL LIGATION    . vaginal cyst removed     several times    Current Outpatient Prescriptions  Medication Sig Dispense Refill  . ACCU-CHEK FASTCLIX LANCETS MISC USE  TO CHECK BLOOD SUGAR THREE TIMES DAILY 306 each 0  . ACCU-CHEK SMARTVIEW test strip CHECK BLOOD SUGAR THREE TIMES DAILY 300 each 1  . acetaminophen (TYLENOL) 500 MG tablet Take 500 mg by mouth every 4 (four) hours as needed.     . Alcohol Swabs (B-D SINGLE USE SWABS REGULAR) PADS Use as directed 300 each 1  . ARIPiprazole (ABILIFY) 10 MG tablet Take 10 mg by mouth daily.    Marland Kitchen aspirin EC 81 MG tablet Take 1 tablet (81 mg total) by mouth at bedtime.    Marland Kitchen atorvastatin (LIPITOR) 40 MG tablet TAKE ONE TABLET BY MOUTH IN THE EVENING 30 tablet 3  . B Complex-C (B-COMPLEX WITH VITAMIN C) tablet Take 1 tablet by mouth daily.    . Blood Glucose Monitoring Suppl (ACCU-CHEK NANO SMARTVIEW) W/DEVICE KIT Use to check blood sugar 3 times per day dx code E11.65 1 kit 0  . carbidopa-levodopa (SINEMET IR) 25-100 MG per tablet Take 1 tablet by mouth 3 (three) times daily.     . carvedilol (COREG) 25 MG tablet Take 1 tablet (25 mg total) by mouth 2 (two) times daily. 180 tablet 3  . cholecalciferol (VITAMIN D) 1000 units tablet Take 1,000 Units by mouth daily.    . cholestyramine (QUESTRAN) 4 G packet Take 4 g by mouth 2 (two) times daily as needed.    . furosemide (LASIX) 40 MG tablet Take 1 tablet (40 mg total) by mouth daily. 30 tablet 6  . hydrALAZINE (APRESOLINE) 25 MG tablet Take 1 tablet (25 mg total) by mouth 2 (two) times daily. 60 tablet 3  . HYDROcodone-acetaminophen (NORCO/VICODIN) 5-325 MG tablet Take 1 tablet by mouth every 6 (six) hours as needed for moderate  pain.    Marland Kitchen insulin aspart (NOVOLOG FLEXPEN) 100 UNIT/ML FlexPen Inject4- 6 units before breakfast and lunch and 10-12 units before dinner (Patient taking differently: Inject 6 units before breakfast and lunch and 12 units before dinner) 30 mL 3  . Insulin Glargine (TOUJEO SOLOSTAR) 300 UNIT/ML SOPN Inject  40 Units into the skin daily. (Patient taking differently: Inject 36 Units into the skin daily. ) 18 mL 1  . Insulin Pen Needle 31G X 8 MM MISC Use to inject medication 4 times daily 400 each 1  . isosorbide mononitrate (IMDUR) 30 MG 24 hr tablet TAKE 1 TABLET (30 MG TOTAL) BY MOUTH DAILY. 90 tablet 3  . JARDIANCE 10 MG TABS tablet TAKE ONE TABLET BY MOUTH ONCE DAILY WITH  BREAKFAST 30 tablet 3  . ketorolac (ACULAR) 0.4 % SOLN Place 1 drop into the left eye 4 (four) times daily.    Marland Kitchen lamoTRIgine (LAMICTAL) 100 MG tablet Take 100 mg by mouth daily.    . Multiple Vitamins-Minerals (MULTIVITAMIN WITH MINERALS) tablet Take 1 tablet by mouth daily.    Marland Kitchen ofloxacin (OCUFLOX) 0.3 % ophthalmic solution Place 1 drop into the left eye 4 (four) times daily.    Marland Kitchen olmesartan (BENICAR) 40 MG tablet Take 40 mg by mouth daily.    . Pancrelipase, Lip-Prot-Amyl, 25000 UNITS CPEP Take 5 capsules by mouth 3 (three) times daily. 2 caps at breakfast, 1 at lunch, 2 at dinner    . potassium chloride SA (K-DUR,KLOR-CON) 20 MEQ tablet Take 20 mEq by mouth daily.      Marland Kitchen PREDNISOLONE ACETATE OP Place 1 drop into the left eye 4 (four) times daily.    . pregabalin (LYRICA) 100 MG capsule Take 100 mg by mouth 2 (two) times daily.    . sertraline (ZOLOFT) 50 MG tablet Take 50 mg by mouth daily.    Marland Kitchen VICTOZA 18 MG/3ML SOPN INJECT 1.'8MG'$  SUBCUTANEOUSLY DAILY (NEEDS APPOINTMENT FOR REFILLS) 27 mL 0   No current facility-administered medications for this visit.     Allergies:   Penicillins; Sulfa antibiotics; Codeine; and Other    Social History:  The patient  reports that she has quit smoking. Her smoking use included  Cigarettes. She has a 12.50 pack-year smoking history. She has never used smokeless tobacco. She reports that she does not drink alcohol or use drugs.   Family History:  The patient's family history includes Allergies in her father; Cancer in her other; Diabetes in her brother, mother, sister, and son; Heart disease in her father, mother, and sister; Heart failure in her father; Hyperlipidemia in her father, mother, and sister; Hypertension in her brother, father, mother, sister, sister, and son.    ROS:  Please see the history of present illness. All other systems are reviewed and negative.    PHYSICAL EXAM: VS:  BP (!) 150/88   Pulse 70   Ht 5' (1.524 m)   Wt 284 lb (128.8 kg)   SpO2 92%   BMI 55.46 kg/m  , BMI Body mass index is 55.46 kg/m. GEN: Well nourished, well developed, female in no acute distress  HEENT: normal for age  Neck: JVD 8-9 cm, no carotid bruit, no masses Cardiac: RRR; 2/6 murmur, no rubs, or gallops Respiratory: some rales bases bilaterally, normal work of breathing GI: soft, nontender, nondistended, + BS MS: no deformity or atrophy; 2+ LE edema; distal pulses are 2+ in all 4 extremities   Skin: warm and dry, no rash Neuro:  Strength and sensation are intact Psych: euthymic mood, full affect   EKG:  EKG is not ordered today.   Recent Labs: 10/04/2016: BUN 12; Creat 0.91; Potassium 4.4; Sodium 135    Lipid Panel    Component Value Date/Time   CHOL 135 09/30/2015 1047   TRIG 71.0 09/30/2015 1047  HDL 62.50 09/30/2015 1047   CHOLHDL 2 09/30/2015 1047   VLDL 14.2 09/30/2015 1047   LDLCALC 59 09/30/2015 1047   LDLDIRECT 66.0 01/27/2015 1104     Wt Readings from Last 3 Encounters:  11/12/16 284 lb (128.8 kg)  09/13/16 276 lb (125.2 kg)  06/11/16 279 lb 6.4 oz (126.7 kg)     Other studies Reviewed: Additional studies/ records that were reviewed today include: office notes and testing.  ASSESSMENT AND PLAN:  1.   Acute on chronic diastolic CHF:  She has a poor understanding of the disease and what contributes to worsening.  I reviewed the sodium levels and some of the foods that she eats regularly and she was very surprised to hear how high they are. And I encouraged her to get her family to help her check sodium levels in foods and help her pick Better options.  I encouraged her to do daily weights.her target weight is 275 pounds.   We will increase  After that it is okay for her to go back to 40 mg daily if her weight is at target.  If her weight is not at target or if weight goes 3 pounds in a day or 5 pounds in a week, she is to take 80 mg of Lasix. When she takes the higher dose of Lasix, she is to take 2 of her potassium tablets. Check a BMET today and follow-up in one week with another BMET.   2. HTN:  Her blood pressure is elevated today but she states she has not taken her medication. Hopefully, we will be able to get a blood pressure after she takes her medication to determine if her blood pressure is controlled or not.   Current medicines are reviewed at length with the patient today.  The patient does not have concerns regarding medicines.  The following changes have been made:  Increase Lasix and potassium  Labs/ tests ordered today include:   Orders Placed This Encounter  Procedures  . Basic metabolic panel  . Basic metabolic panel     Disposition:   FU with  Dr. Ellyn Hack or myself in a week  Signed, Lenoard Aden  11/12/2016 10:34 AM    [place] Phone: [phone]; Fax: [phone]  This note was written with the assistance of speech recognition software. Please excuse any transcriptional errors.

## 2016-11-12 NOTE — Patient Instructions (Signed)
Medication Instructions:  TAKE LASIX 80MG  FOR 4 DAYS THEN RETURN TO 40MG  DAILY;  IF NO WEIGHT LOSS CALL us TO CONTINUE 80MG   TAKE 40MG  POTASSIUM WHEN YOU TAKE 80MG  OF LASIX  PLEASE FOLLOW 2,000 MG DAILY LOW SODIUM DIET  YOUR [employer] WEIGHT IS 275 POUNDS--IF WEIGHT IS >3 POUNDS DAILY -OR- 5 POUNDS IN ONE WEEK OK TO TAKE LASIX 80MG  AND POTASSIUM 40MG   If you need a refill on your cardiac medications before your next appointment, please call your pharmacy.  Labwork: BMP TODAY AND IN ONE WEEK AT FOLLOW UP AT [facility] HERE IN OUR OFFICE  Follow-Up: Your physician wants you to follow-up in: [address] [place] OR [address] HARDING  Special Instructions: MAKE SURE TO CALL us IF WEIGHT IS >3 POUNDS DAILY -OR- 5 POUNDS IN ONE WEEK  TO CONTINUE LASIX AND POTASSIUM  Thank you for choosing [facility] at [facility]!!     DASH Eating Plan DASH stands for "Dietary Approaches to Stop Hypertension." The DASH eating plan is a healthy eating plan that has been shown to reduce high blood pressure (hypertension). It may also reduce your risk for type 2 diabetes, heart disease, and stroke. The DASH eating plan may also help with weight loss. What are tips for following this plan? General guidelines   Avoid eating more than 2,300 mg (milligrams) of salt (sodium) a day. If you have hypertension, you may need to reduce your sodium intake to 1,500 mg a day.  Limit alcohol intake to no more than 1 drink a day for nonpregnant women and 2 drinks a day for men. One drink equals 12 oz of beer, 5 oz of wine, or 1 oz of hard liquor.  Work with your health care provider to maintain a healthy body weight or to lose weight. Ask what an ideal weight is for you.  Get at least 30 minutes of exercise that causes your heart to beat faster (aerobic exercise) most days of the week. Activities may include walking, swimming, or biking.  Work with your health care provider or diet and nutrition specialist (dietitian) to adjust  your eating plan to your individual calorie needs. Reading food labels   Check food labels for the amount of sodium per serving. Choose foods with less than 5 percent of the Daily Value of sodium. Generally, foods with less than 300 mg of sodium per serving fit into this eating plan.  To find whole grains, look for the word "whole" as the first word in the ingredient list. Shopping   Buy products labeled as "low-sodium" or "no salt added."  Buy fresh foods. Avoid canned foods and premade or frozen meals. Cooking   Avoid adding salt when cooking. Use salt-free seasonings or herbs instead of table salt or sea salt. Check with your health care provider or pharmacist before using salt substitutes.  Do not fry foods. Cook foods using healthy methods such as baking, boiling, grilling, and broiling instead.  Cook with heart-healthy oils, such as olive, canola, soybean, or sunflower oil. Meal planning    Eat a balanced diet that includes:  5 or more servings of fruits and vegetables each day. At each meal, try to fill half of your plate with fruits and vegetables.  Up to 6-8 servings of whole grains each day.  Less than 6 oz of lean meat, poultry, or fish each day. A 3-oz serving of meat is about the same size as a deck of cards. One egg equals 1 oz.  2  servings of low-fat dairy each day.  A serving of nuts, seeds, or beans 5 times each week.  Heart-healthy fats. Healthy fats called Omega-3 fatty acids are found in foods such as flaxseeds and coldwater fish, like sardines, salmon, and mackerel.  Limit how much you eat of the following:  Canned or prepackaged foods.  Food that is high in trans fat, such as fried foods.  Food that is high in saturated fat, such as fatty meat.  Sweets, desserts, sugary drinks, and other foods with added sugar.  Full-fat dairy products.  Do not salt foods before eating.  Try to eat at least 2 vegetarian meals each week.  Eat more home-cooked  food and less restaurant, buffet, and fast food.  When eating at a restaurant, ask that your food be prepared with less salt or no salt, if possible. What foods are recommended? The items listed may not be a complete list. Talk with your dietitian about what dietary choices are best for you. Grains  Whole-grain or whole-wheat bread. Whole-grain or whole-wheat pasta. Brown rice. [place]. Bulgur. Whole-grain and low-sodium cereals. Pita bread. Low-fat, low-sodium crackers. Whole-wheat flour tortillas. Vegetables  Fresh or frozen vegetables (raw, steamed, roasted, or grilled). Low-sodium or reduced-sodium tomato and vegetable juice. Low-sodium or reduced-sodium tomato sauce and tomato paste. Low-sodium or reduced-sodium canned vegetables. Fruits  All fresh, dried, or frozen fruit. Canned fruit in natural juice (without added sugar). Meat and other protein foods  Skinless chicken or [place]. Ground chicken or [place]. Pork with fat trimmed off. Fish and seafood. Egg whites. Dried beans, peas, or lentils. Unsalted nuts, nut butters, and seeds. Unsalted canned beans. Lean cuts of beef with fat trimmed off. Low-sodium, lean deli meat. Dairy  Low-fat (1%) or fat-free (skim) milk. Fat-free, low-fat, or reduced-fat cheeses. Nonfat, low-sodium ricotta or cottage cheese. Low-fat or nonfat yogurt. Low-fat, low-sodium cheese. Fats and oils  Soft margarine without trans fats. Vegetable oil. Low-fat, reduced-fat, or light mayonnaise and salad dressings (reduced-sodium). Canola, safflower, olive, soybean, and sunflower oils. Avocado. Seasoning and other foods  Herbs. Spices. Seasoning mixes without salt. Unsalted popcorn and pretzels. Fat-free sweets. What foods are not recommended? The items listed may not be a complete list. Talk with your dietitian about what dietary choices are best for you. Grains  Baked goods made with fat, such as croissants, muffins, or some breads. Dry pasta or rice meal  packs. Vegetables  Creamed or fried vegetables. Vegetables in a cheese sauce. Regular canned vegetables (not low-sodium or reduced-sodium). Regular canned tomato sauce and paste (not low-sodium or reduced-sodium). Regular tomato and vegetable juice (not low-sodium or reduced-sodium). Angie Fava. Olives. Fruits  Canned fruit in a light or heavy syrup. Fried fruit. Fruit in cream or butter sauce. Meat and other protein foods  Fatty cuts of meat. Ribs. Fried meat. Berniece Salines. Sausage. Bologna and other processed lunch meats. Salami. Fatback. Hotdogs. Bratwurst. Salted nuts and seeds. Canned beans with added salt. Canned or smoked fish. Whole eggs or egg yolks. Chicken or [place] with skin. Dairy  Whole or 2% milk, cream, and half-and-half. Whole or full-fat cream cheese. Whole-fat or sweetened yogurt. Full-fat cheese. Nondairy creamers. Whipped toppings. Processed cheese and cheese spreads. Fats and oils  Butter. Stick margarine. Lard. Shortening. Ghee. Bacon fat. Tropical oils, such as coconut, palm kernel, or palm oil. Seasoning and other foods  Salted popcorn and pretzels. Onion salt, garlic salt, seasoned salt, table salt, and sea salt. Worcestershire sauce. Tartar sauce. Barbecue sauce. Teriyaki sauce. Soy sauce, including  reduced-sodium. Steak sauce. Canned and packaged gravies. Fish sauce. Oyster sauce. Cocktail sauce. Horseradish that you find on the shelf. Ketchup. Mustard. Meat flavorings and tenderizers. Bouillon cubes. Hot sauce and Tabasco sauce. Premade or packaged marinades. Premade or packaged taco seasonings. Relishes. Regular salad dressings. Where to find more information:  National Heart, Lung, and [place]: https://wilson-eaton.com/  American Heart Association: www.heart.org Summary  The DASH eating plan is a healthy eating plan that has been shown to reduce high blood pressure (hypertension). It may also reduce your risk for type 2 diabetes, heart disease, and stroke.  With the DASH  eating plan, you should limit salt (sodium) intake to 2,300 mg a day. If you have hypertension, you may need to reduce your sodium intake to 1,500 mg a day.  When on the DASH eating plan, aim to eat more fresh fruits and vegetables, whole grains, lean proteins, low-fat dairy, and heart-healthy fats.  Work with your health care provider or diet and nutrition specialist (dietitian) to adjust your eating plan to your individual calorie needs. This information is not intended to replace advice given to you by your health care provider. Make sure you discuss any questions you have with your health care provider. Document Released: 05/31/2011 Document Revised: 06/04/2016 Document Reviewed: 06/04/2016 Elsevier Interactive Patient Education  2017 [employer].

## 2016-11-14 ENCOUNTER — Other Ambulatory Visit: Payer: Self-pay | Admitting: Pharmacist

## 2016-11-20 DIAGNOSIS — Z79899 Other long term (current) drug therapy: Secondary | ICD-10-CM | POA: Diagnosis not present

## 2016-11-20 LAB — BASIC METABOLIC PANEL
BUN/Creatinine Ratio: 18 (ref 12–28)
BUN: 19 mg/dL (ref 8–27)
CO2: 27 mmol/L (ref 18–29)
Calcium: 9.3 mg/dL (ref 8.7–10.3)
Chloride: 96 mmol/L (ref 96–106)
Creatinine, Ser: 1.04 mg/dL — ABNORMAL HIGH (ref 0.57–1.00)
GFR calc Af Amer: 64 mL/min/{1.73_m2} (ref >59)
GFR calc non Af Amer: 55 mL/min/{1.73_m2} — ABNORMAL LOW (ref >59)
Glucose: 115 mg/dL — ABNORMAL HIGH (ref 65–99)
Potassium: 4.5 mmol/L (ref 3.5–5.2)
Sodium: 138 mmol/L (ref 134–144)

## 2016-11-20 NOTE — Patient Outreach (Signed)
[facility]) Care Management  11/14/2016  [name] Oct 18, 1945 [id number]   71 y.o. year old female referred to [facility] for Medication Management (Pharmacy Telephone Followup)  Spoke with patients sister who states patient is unavailable right now.  She states patient has not been feeling well and is currently sleeping.    Plan: Left message with patients sister to return my call. Followup in 1 week via telephone  [name], PharmD, [place] PGY2 Pharmacy Resident [phone]

## 2016-11-21 ENCOUNTER — Other Ambulatory Visit: Payer: Self-pay | Admitting: Pharmacist

## 2016-11-22 ENCOUNTER — Other Ambulatory Visit: Payer: Self-pay | Admitting: Endocrinology

## 2016-11-23 ENCOUNTER — Ambulatory Visit (INDEPENDENT_AMBULATORY_CARE_PROVIDER_SITE_OTHER): Payer: Medicare HMO | Admitting: Cardiology

## 2016-11-23 VITALS — BP 119/71 | HR 63 | Ht 60.0 in | Wt 278.2 lb

## 2016-11-23 DIAGNOSIS — I2510 Atherosclerotic heart disease of native coronary artery without angina pectoris: Secondary | ICD-10-CM

## 2016-11-23 DIAGNOSIS — I110 Hypertensive heart disease with heart failure: Secondary | ICD-10-CM

## 2016-11-23 DIAGNOSIS — E662 Morbid (severe) obesity with alveolar hypoventilation: Secondary | ICD-10-CM

## 2016-11-23 DIAGNOSIS — R609 Edema, unspecified: Secondary | ICD-10-CM

## 2016-11-23 DIAGNOSIS — Z6841 Body Mass Index (BMI) 40.0 and over, adult: Secondary | ICD-10-CM

## 2016-11-23 DIAGNOSIS — Z79899 Other long term (current) drug therapy: Secondary | ICD-10-CM

## 2016-11-23 DIAGNOSIS — R000 Tachycardia, unspecified: Secondary | ICD-10-CM

## 2016-11-23 DIAGNOSIS — R0609 Other forms of dyspnea: Secondary | ICD-10-CM | POA: Diagnosis not present

## 2016-11-23 DIAGNOSIS — I5032 Chronic diastolic (congestive) heart failure: Secondary | ICD-10-CM

## 2016-11-23 MED ORDER — FUROSEMIDE 80 MG PO TABS: 80.0000 mg | ORAL_TABLET | Freq: Every day | ORAL | 3 refills | Status: DC

## 2016-11-23 NOTE — Progress Notes (Signed)
PCP: [name] Chard, MD  Clinic Note: Chief Complaint  Patient presents with  . Follow-up    patient saw [name] 1 week ago.    HPI: [name] is a 71 y.o. female with a PMH below who presents [name] for 1 week follow-up after seeing [name] last week.. She has a history of known nonocclusive CAD by cath in 2012 (confirmed with negative Myoview in August 2017), essential hypertension and "diastolic heart failure with obesity and obesity hypoventilation syndrome. She was supposed be on CPAP. I last saw her in March 2018, she is doing relatively well still having some exertional dyspnea but not with routine activity. Chronic trace edema.  [name] last week by [name] due to worsening lower extremity edema and dyspnea. Her weight was up to roughly 283 pounds which was a 7 pound weight gain for her. She was truthful and admitted to sodium indiscretion. [name] Marker spoke with her in detail about her dietary indiscretions and how this affects her blood pressure and edema.  Recent Hospitalizations: None  Studies Personally Reviewed - (if available, images/films reviewed: From Epic Chart or Care Everywhere)  None  Interval History: [name] breathing a little bit better. She is appear overall with her edema and breathing, but is still a little bit unsure of what to do with her medications. From a cardiac standpoint other than her dyspnea, she notes some mild "fluttering the last a few seconds off and on and some intermittent chest discomfort off and on, but nothing sustained. She has not been using CPAP, and does occasionally wake up short of breath. She showed me her home scales and has been usually in the mid 270s at home. No real anginal chest discomfort or pressure with exertion. She does have exertional dyspnea but it is actually stable if not improved.  Rare  Palpitations. No weakness or syncope/near syncope. She does note  some Lightheadedness & dizziness - usually after exertion No TIA/amaurosis fugax symptoms. No claudication.  ROS: A comprehensive was performed. Review of Systems  Constitutional: Positive for malaise/fatigue. Negative for weight loss.  HENT: Negative for congestion.   Respiratory: Positive for shortness of breath (For history of present illness).   Cardiovascular: Positive for palpitations and leg swelling.  Gastrointestinal: Negative for blood in stool and melena.  Genitourinary: Negative for hematuria.  Musculoskeletal: Negative for joint pain.  Neurological: Positive for dizziness (Per history of present illness).  Psychiatric/Behavioral: Negative for depression and memory loss. The patient is not nervous/anxious.   All other systems reviewed and are negative.   I have reviewed and (if needed) personally updated the patient's problem list, medications, allergies, past medical and surgical history, social and family history.   Past Medical History:  Diagnosis Date  . Abnormal liver function     in the past.  . Anemia   . Arthritis    all over  . Bruises easily   . Cataract    right eye;immature  . Chronic back pain    stenosis  . Chronic cough   . Demand myocardial infarction [facility]) 2012   [place]and Infarction in setting of Pancreatitis --> mild Troponin elevation, Non-obstructive CAD  . Depression    takes Abilify daily as well as Zoloft  . Diastolic heart failure 8768   Grade 1 diastolic Dysfunction by Echo   . Diverticulosis   . DM (diabetes mellitus) ([place])    takes Victoza daily as well as Lantus and  Humalog  . Empty sella ([place])    on MRI in 2009.  . Glaucoma   . Headache(784.0)    last migraine-4-55yr ago  . History of blood transfusion    no abnormal reaction noted  . History of colon polyps    benign  . History of influenza    antibiotic given yesterday along with taking Delsym cough syrup  . HTN (hypertension)    takes BKinder Morgan Energydaily  .  Hyperlipidemia    takes Lipitor daily  . Joint swelling   . Nocturia   . Obstructive sleep apnea   . Pancreatitis    takes Pancrelipase daily  . Parkinson's disease ([place]    takes Sinemet daily  . Peripheral edema    takes Lasix daily  . Peripheral neuropathy   . Pneumonia 2012  . Seizure disorder ([place]   . Seizures ([place]    takes Lamictal daily and Primidone nightly;last seizure 2wks ago  . Urinary frequency   . Urinary urgency     Past Surgical History:  Procedure Laterality Date  . ABDOMINAL HYSTERECTOMY    . APPENDECTOMY    . BACK SURGERY    . CARDIAC CATHETERIZATION  2009/March 2012   2009: (Dr. KAlvie He[place] Nonobstructive CAD; 2012: Minimal CAD --> false positive stress test  . Cardiac Event Monitor  September-October 2017   Sinus rhythm with occasional PACs and artifact. No arrhythmias besides one short run of tachycardia.  . CHOLECYSTECTOMY    . COLONOSCOPY N/A 08/15/2012   Procedure: COLONOSCOPY;  Surgeon: WArta Silence MD;  Location: [facility];  Service: Endoscopy;  Laterality: N/A;  . ESOPHAGOGASTRODUODENOSCOPY    . eye cysts Bilateral   . LASIK    . NM MYOVIEW LTD  March 2012; Aug 2017   Probably normal Lexiscan Myoview. Mild reversible Anterior Defect ~ Breast Attenuation. (CRO ischemia):  FALSE POSITIVE by Cath; b) 8/'17: LOW RISK, No ischemia or Infarction.  Breast Attenuation not noted.  .Marland KitchenNM MYOVIEW LTD  01/2016   LOW RISK. No ischemia or infarction.  .Marland KitchenRECTAL POLYPECTOMY    . TONSILLECTOMY    . TRANSTHORACIC ECHOCARDIOGRAM  01/2016   Normal LV chamber size with moderate LVH pattern. EF 50-55%. Severe LA dilation. Moderate RA dilation. [name] pressure elevated at 38 mmHg (mild as (  . TUBAL LIGATION    . vaginal cyst removed     several times    Current Meds  Medication Sig  . ACCU-CHEK FASTCLIX LANCETS MISC USE  TO CHECK BLOOD SUGAR THREE TIMES DAILY  . ACCU-CHEK SMARTVIEW test strip CHECK BLOOD SUGAR THREE TIMES DAILY  . acetaminophen (TYLENOL) 500  MG tablet Take 500 mg by mouth every 4 (four) hours as needed.   . Alcohol Swabs (B-D SINGLE USE SWABS REGULAR) PADS Use as directed  . ARIPiprazole (ABILIFY) 10 MG tablet Take 10 mg by mouth daily.  .Marland Kitchenaspirin EC 81 MG tablet Take 1 tablet (81 mg total) by mouth at bedtime.  .Marland Kitchenatorvastatin (LIPITOR) 40 MG tablet TAKE ONE TABLET BY MOUTH IN THE EVENING  . B Complex-C (B-COMPLEX WITH VITAMIN C) tablet Take 1 tablet by mouth daily.  . Blood Glucose Monitoring Suppl (ACCU-CHEK NANO SMARTVIEW) W/DEVICE KIT Use to check blood sugar 3 times per day dx code E11.65  . carbidopa-levodopa (SINEMET IR) 25-100 MG per tablet Take 1 tablet by mouth 3 (three) times daily.   . carvedilol (COREG) 25 MG tablet Take 1 tablet (25 mg total) by mouth 2 (two) times daily.  .Marland Kitchen  cholecalciferol (VITAMIN D) 1000 units tablet Take 1,000 Units by mouth daily.  . cholestyramine (QUESTRAN) 4 G packet Take 4 g by mouth 2 (two) times daily as needed.  . hydrALAZINE (APRESOLINE) 25 MG tablet Take 1 tablet (25 mg total) by mouth 2 (two) times daily.  Marland Kitchen HYDROcodone-acetaminophen (NORCO/VICODIN) 5-325 MG tablet Take 1 tablet by mouth every 6 (six) hours as needed for moderate pain.  Marland Kitchen insulin aspart (NOVOLOG FLEXPEN) 100 UNIT/ML FlexPen Inject4- 6 units before breakfast and lunch and 10-12 units before dinner (Patient taking differently: Inject 6 units before breakfast and lunch and 12 units before dinner)  . Insulin Glargine (TOUJEO SOLOSTAR) 300 UNIT/ML SOPN Inject 40 Units into the skin daily. (Patient taking differently: Inject 36 Units into the skin daily. )  . Insulin Pen Needle 31G X 8 MM MISC Use to inject medication 4 times daily  . isosorbide mononitrate (IMDUR) 30 MG 24 hr tablet TAKE 1 TABLET (30 MG TOTAL) BY MOUTH DAILY.  Marland Kitchen JARDIANCE 10 MG TABS tablet TAKE ONE TABLET BY MOUTH ONCE DAILY WITH  BREAKFAST  . ketorolac (ACULAR) 0.4 % SOLN Place 1 drop into the left eye 4 (four) times daily.  Marland Kitchen lamoTRIgine (LAMICTAL) 100 MG  tablet Take 100 mg by mouth daily.  . Multiple Vitamins-Minerals (MULTIVITAMIN WITH MINERALS) tablet Take 1 tablet by mouth daily.  Marland Kitchen ofloxacin (OCUFLOX) 0.3 % ophthalmic solution Place 1 drop into the left eye 4 (four) times daily.  Marland Kitchen olmesartan (BENICAR) 40 MG tablet Take 40 mg by mouth daily.  . Pancrelipase, Lip-Prot-Amyl, 25000 UNITS CPEP Take 5 capsules by mouth 3 (three) times daily. 2 caps at breakfast, 1 at lunch, 2 at dinner  . potassium chloride SA (K-DUR,KLOR-CON) 20 MEQ tablet Take 20 mEq by mouth daily.    Marland Kitchen PREDNISOLONE ACETATE OP Place 1 drop into the left eye 4 (four) times daily.  . pregabalin (LYRICA) 100 MG capsule Take 100 mg by mouth 2 (two) times daily.  . sertraline (ZOLOFT) 50 MG tablet Take 50 mg by mouth daily.  Marland Kitchen VICTOZA 18 MG/3ML SOPN INJECT 1.8MG SUBCUTANEOUSLY DAILY (NEEDS APPOINTMENT FOR REFILLS)  . [DISCONTINUED] furosemide (LASIX) 40 MG tablet Take 1 tablet (40 mg total) by mouth daily.    Allergies  Allergen Reactions  . Penicillins Hives  . Sulfa Antibiotics Hives  . Codeine     Headache and "just feel off"  . Other Rash    [place]    Social History   Social History  . Marital status: Divorced    Spouse name: N/A  . Number of children: N/A  . Years of education: N/A   Occupational History  . Disabled    Social History Main Topics  . Smoking status: Former Smoker    Packs/day: 0.50    Years: 25.00    Types: Cigarettes  . Smokeless tobacco: Never Used     Comment: quit smoking 20+ytrs ago  . Alcohol use No  . Drug use: No  . Sexual activity: No   Other Topics Concern  . None   Social History Narrative   She lives in [place]. She has lots of family in the area and is accompanied by her sister.   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   She has a distant history of smoking, quit 20 years ago.       family history includes Allergies in her father; Cancer in her other; Diabetes in her brother,  mother, sister,  and son; Heart disease in her father, mother, and sister; Heart failure in her father; Hyperlipidemia in her father, mother, and sister; Hypertension in her brother, father, mother, sister, sister, and son.  Wt Readings from Last 3 Encounters:  11/23/16 278 lb 3.2 oz (126.2 kg)  11/12/16 284 lb (128.8 kg)  09/13/16 276 lb (125.2 kg)    PHYSICAL EXAM BP 119/71   Pulse 63   Ht 5' (1.524 m)   Wt 278 lb 3.2 oz (126.2 kg)   BMI 54.33 kg/m  General appearance: alert, cooperative, appears stated age, no distress. Morbidly obese. Well groomed HEENT: [place]/AT, EOMI, MMM, anicteric sclera Neck: no adenopathy, no carotid bruit and no JVD Lungs: clear to auscultation bilaterally, normal percussion bilaterally and non-labored Heart: regular rate and rhythm, S1 &S2 normal, soft SEM HSM at left sternal border. No rubs or gallops. Unable to palpate PMI. Abdomen: soft, non-tender; bowel sounds normal; no masses,  no organomegaly; morbidly obese abdomen Extremities: extremities normal, atraumatic, no cyanosis, and edema trace to 1+ Pulses: 2+ and symmetric;  Skin: mobility and turgor normal, no evidence of bleeding or bruising and no lesions noted  Neurologic: Mental status: Alert & oriented x 3, thought content appropriate; non-focal exam.  Pleasant mood & affect.    Adult ECG Report n/a  Other studies Reviewed: Additional studies/ records that were reviewed [name] include:  Recent Labs:   Lab Results  Component Value Date   CREATININE 1.04 (H) 11/20/2016   BUN 19 11/20/2016   NA 138 11/20/2016   K 4.5 11/20/2016   CL 96 11/20/2016   CO2 27 11/20/2016    ASSESSMENT / PLAN: Problem List Items Addressed This Visit    Chronic diastolic congestive heart failure (HCC) (Chronic)    I'm not sure how much her symptoms are related to diastolic dysfunction comfort versus her obesity and deconditioning. She also has most likely obesity hypoventilation syndrome and OSA that are  contributing.  We will try to shoot for a dry weight at home of closer to 270-273 pounds. - In doing so we will increase her standing Lasix dose to 80 mg daily. - She can still use an additional dose when necessary. Blood pressure is relatively well-controlled actually on carvedilol max dose and hydralazine/Imdur along with Benicar. - We may be of further titrate up her hydralazine little bit, but her blood pressure looks good [name].      Relevant Medications   furosemide (LASIX) 80 MG tablet   Coronary artery disease, non-occlusive (Chronic)    Nodular disease by cath negative Myoview. No real anginal symptoms. Continue current management      Relevant Medications   furosemide (LASIX) 80 MG tablet   DOE (dyspnea on exertion)   Relevant Orders   Basic metabolic panel   Hypertensive heart disease - Primary (Chronic)    Control blood pressure on current meds. --> Could potentially titrate up hydralazine if pressures increase. Titrating up Lasix dose to avoid worsening edema - We will check chemistry panel to reevaluate renal function.  Otherwise, she simply needs to try to exercise. Needs OSA evaluation and to start CPAP if ordered.      Relevant Medications   furosemide (LASIX) 80 MG tablet   Morbid obesity with BMI of 50.0-59.9, adult (HCC) (Chronic)    Clearly this is contributing to her dyspnea. She needs to slowly gradually lose weight. Difficult for her to do walking because her orthotic break braces on her ankles. She needs to try to do  water aerobics especially in the summer coming up.      Obesity hypoventilation syndrome (HCC) (Chronic)    Need to complete CPAP evaluation.      Peripheral edema (Chronic)    Probably more related to right-sided heart issues. I don't much is diastolic dysfunction mediated versus cor pulmonale related. This titrating up diuretic. Support stockings recommended if she can put them on.      Racing heart beat    Doing relatively well now  that she is on higher dose of carvedilol. Written short episodes. Not overly worrisome.       Other Visit Diagnoses    Medication management       Relevant Orders   Basic metabolic panel      Current medicines are reviewed at length with the patient [name]. (+/- concerns) n/a The following changes have been made: n/a  Patient Instructions  Your physician has recommended you make the following change in your medication:   1.) the furosemide has been doubled to 80 mg daily. [facility] this [facility]. Meantime you  Can take (2) of the furosemide 40 mg tablets daily to equal the 80 mg until completed, then start the new 80 mg dose.  Your physician recommends that you return for lab work in: 2 weeks.   Your doctor has ordered you to have blood work. You may go to any lab you choose, however there is a [facility] lab located [place]. Check in with the front office staff. NO APPOINTMENT IS NEED. However they are usually gone between the hours of 1-2 for lunch. The Nurse/Medical Assistant will give you instructions on the need to fast.  Your physician recommends that you schedule a follow-up appointment in: 3 months with [name] and 6 months with Dr Ellyn Hack.    Studies Ordered:   Orders Placed This Encounter  Procedures  . Basic metabolic panel      Glenetta Hew, M.D., M.S. Interventional Cardiologist   Pager # [phone] Phone # [phone] [address] [place], [place] [address]

## 2016-11-23 NOTE — Patient Instructions (Signed)
Your physician has recommended you make the following change in your medication:   1.) the furosemide has been doubled to 80 mg daily. A [facility] this change. Meantime you  Can take (2) of the furosemide 40 mg tablets daily to equal the 80 mg until completed, then start the new 80 mg dose.  Your physician recommends that you return for lab work in: 2 weeks.   Your doctor has ordered you to have blood work. You may go to any lab you choose, however there is a [facility] lab located [place]. Check in with the front office staff. NO APPOINTMENT IS NEED. However they are usually gone between the hours of 1-2 for lunch. The Nurse/Medical Assistant will give you instructions on the need to fast.  Your physician recommends that you schedule a follow-up appointment in: 3 months with Rosaria Ferries and 6 months with Dr Ellyn Hack.

## 2016-11-25 ENCOUNTER — Encounter: Payer: Self-pay | Admitting: Cardiology

## 2016-11-25 NOTE — Assessment & Plan Note (Signed)
I'm not sure how much her symptoms are related to diastolic dysfunction comfort versus her obesity and deconditioning. She also has most likely obesity hypoventilation syndrome and OSA that are contributing.  We will try to shoot for a dry weight at home of closer to 270-273 pounds. - In doing so we will increase her standing Lasix dose to 80 mg daily. - She can still use an additional dose when necessary. Blood pressure is relatively well-controlled actually on carvedilol max dose and hydralazine/Imdur along with Benicar. - We may be of further titrate up her hydralazine little bit, but her blood pressure looks good today.

## 2016-11-25 NOTE — Assessment & Plan Note (Signed)
Clearly this is contributing to her dyspnea. She needs to slowly gradually lose weight. Difficult for her to do walking because her orthotic break braces on her ankles. She needs to try to do water aerobics especially in the summer coming up.

## 2016-11-25 NOTE — Assessment & Plan Note (Signed)
Probably more related to right-sided heart issues. I don't much is diastolic dysfunction mediated versus cor pulmonale related. This titrating up diuretic. Support stockings recommended if she can put them on.

## 2016-11-25 NOTE — Assessment & Plan Note (Signed)
Need to complete CPAP evaluation.

## 2016-11-25 NOTE — Assessment & Plan Note (Signed)
Doing relatively well now that she is on higher dose of carvedilol. Written short episodes. Not overly worrisome.

## 2016-11-25 NOTE — Assessment & Plan Note (Signed)
Nodular disease by cath negative Myoview. No real anginal symptoms. Continue current management

## 2016-11-25 NOTE — Assessment & Plan Note (Addendum)
Control blood pressure on current meds. --> Could potentially titrate up hydralazine if pressures increase. Titrating up Lasix dose to avoid worsening edema - We will check chemistry panel to reevaluate renal function.  Otherwise, she simply needs to try to exercise. Needs OSA evaluation and to start CPAP if ordered.

## 2016-11-26 DIAGNOSIS — I129 Hypertensive chronic kidney disease with stage 1 through stage 4 chronic kidney disease, or unspecified chronic kidney disease: Secondary | ICD-10-CM | POA: Diagnosis not present

## 2016-11-26 DIAGNOSIS — E1140 Type 2 diabetes mellitus with diabetic neuropathy, unspecified: Secondary | ICD-10-CM | POA: Diagnosis not present

## 2016-11-26 DIAGNOSIS — N182 Chronic kidney disease, stage 2 (mild): Secondary | ICD-10-CM | POA: Diagnosis not present

## 2016-11-26 DIAGNOSIS — E1122 Type 2 diabetes mellitus with diabetic chronic kidney disease: Secondary | ICD-10-CM | POA: Diagnosis not present

## 2016-11-26 DIAGNOSIS — N08 Glomerular disorders in diseases classified elsewhere: Secondary | ICD-10-CM | POA: Diagnosis not present

## 2016-11-27 ENCOUNTER — Telehealth: Payer: Self-pay | Admitting: Endocrinology

## 2016-11-27 ENCOUNTER — Other Ambulatory Visit: Payer: Self-pay

## 2016-11-27 NOTE — Telephone Encounter (Signed)
Please refuse until she makes an appointment

## 2016-11-27 NOTE — Telephone Encounter (Signed)
Please advise if okay to refill. Patient has not been here since late 2017.

## 2016-11-28 NOTE — Telephone Encounter (Signed)
Can you please make an appointment for this patient to follow up? Thank you!

## 2016-11-29 ENCOUNTER — Telehealth: Payer: Self-pay | Admitting: Internal Medicine

## 2016-11-29 ENCOUNTER — Telehealth: Payer: Self-pay | Admitting: Endocrinology

## 2016-11-29 NOTE — Telephone Encounter (Signed)
Noted, and completed.

## 2016-11-29 NOTE — Telephone Encounter (Signed)
Please read note.

## 2016-11-29 NOTE — Telephone Encounter (Signed)
Pt called to let Dr. Dwyane Dee know that Dr. Baird Cancer will now be taking over as her Endocrinologist. She requested the medical records release form be mailed.  I sent it out to her today.

## 2016-11-29 NOTE — Telephone Encounter (Signed)
LVM to schedule appointment.

## 2016-11-29 NOTE — Telephone Encounter (Signed)
Patient needs form mailed to her for records to be transferred to Dr. Baird Cancer who now takes care of her diabetes concerns. Transferred call to [place].

## 2016-12-07 DIAGNOSIS — Z79899 Other long term (current) drug therapy: Secondary | ICD-10-CM | POA: Diagnosis not present

## 2016-12-07 DIAGNOSIS — R0609 Other forms of dyspnea: Secondary | ICD-10-CM | POA: Diagnosis not present

## 2016-12-08 LAB — BASIC METABOLIC PANEL
BUN/Creatinine Ratio: 16 (ref 12–28)
BUN: 17 mg/dL (ref 8–27)
CO2: 29 mmol/L (ref 20–29)
Calcium: 9.3 mg/dL (ref 8.7–10.3)
Chloride: 96 mmol/L (ref 96–106)
Creatinine, Ser: 1.04 mg/dL — ABNORMAL HIGH (ref 0.57–1.00)
GFR calc Af Amer: 64 mL/min/{1.73_m2} (ref >59)
GFR calc non Af Amer: 55 mL/min/{1.73_m2} — ABNORMAL LOW (ref >59)
Glucose: 110 mg/dL — ABNORMAL HIGH (ref 65–99)
Potassium: 5.1 mmol/L (ref 3.5–5.2)
Sodium: 138 mmol/L (ref 134–144)

## 2016-12-11 DIAGNOSIS — H00024 Hordeolum internum left upper eyelid: Secondary | ICD-10-CM | POA: Diagnosis not present

## 2016-12-12 DIAGNOSIS — I83813 Varicose veins of bilateral lower extremities with pain: Secondary | ICD-10-CM | POA: Diagnosis not present

## 2016-12-12 DIAGNOSIS — M2140 Flat foot [pes planus] (acquired), unspecified foot: Secondary | ICD-10-CM | POA: Diagnosis not present

## 2016-12-12 DIAGNOSIS — E1140 Type 2 diabetes mellitus with diabetic neuropathy, unspecified: Secondary | ICD-10-CM | POA: Diagnosis not present

## 2016-12-12 DIAGNOSIS — G5712 Meralgia paresthetica, left lower limb: Secondary | ICD-10-CM | POA: Diagnosis not present

## 2016-12-12 NOTE — Patient Outreach (Signed)
[facility]) Care Management  11/21/2016  [name] 16-May-1946 [id number]  71 y.o. year old female referred to [place] for Medication Management (Pharmacy Telephone Followup)   Was unable to reach patient via telephone today and have left HIPAA compliant voicemail asking patient to return my call (unsuccessful outreach #1).  Plan: Will followup within 2-3 weeks via telephone

## 2016-12-14 ENCOUNTER — Other Ambulatory Visit: Payer: Self-pay | Admitting: Pharmacist

## 2016-12-14 NOTE — Patient Outreach (Signed)
[facility]) Care Management  12/14/2016  [name] Apr 25, 1946 [id number]   71 y.o. year old female referred to [place] for Medication Management (Pharmacy Telephone Followup) Patient is going to a vein specialist to followup pain in her legs.  She reports she has an eye infection but has been going to an opthalmologist.  Patient states she has some questions about some medications she is no longer taking and proper disposal.  She states she is not currently home right now  [place] visit scheduled for next week Will assess medication adherence and medication management and consider signing off from [place] services at this visit  [name], PharmD, [place] PGY2 Pharmacy Resident [id number]

## 2016-12-18 ENCOUNTER — Encounter: Payer: Self-pay | Admitting: Pharmacist

## 2016-12-18 ENCOUNTER — Other Ambulatory Visit: Payer: Self-pay | Admitting: Pharmacist

## 2016-12-18 NOTE — Patient Outreach (Signed)
[name]) Care Management  [place]  12/18/2016  [name] [birth year] [id number]  Subjective: 71 year old female referred to [name] for Medication Management.  [name] pharmacy home visit performed today with patient.    She reports adherence to her regimen.  Previously set an alarm on her phone but she needs it reset.  Misses mid day dose ~1-2 times per week currently.    Heart failure Only checking blood pressure occasionally.   Weight today 277.8 lbs.  (reports most of her weights are around 277-278).   She reports she has been staying with her niece over the past week due to her air conditioning she was unable to weigh herself.  Home Weights:  6/26 277.8 lbs 6/21 275 lbs 6/20 274.2 lbs 6/19 276.8 lbs 6/18 279.4 lbs  He reports  2 pillow orthopnea.   Reports shortness of breath walking from house to car.  She states this has been going on for a while.  She states she didn't admit this to Dr [name].    She has potassium in her pill box but her pill box is now empty.  Currently taking 2 (20 meq) per day.  Needs new prescription stating this.    Currently on atorvastatin 10 mg not 40 mg daily.    Eye Infection Currently on doxycycline for cyst on eye lid.  Also using warm compress.      Diabetes: She was recently started on Jardiance Previously not going to [name] any more due to family issues.  Plans to start walking or using some home equipment soon.    Reports one hypoglycemic episode over past month with CBGs in 60s.  Felt hot and sweaty.  Reports proper treatment knowledge.    7 day average 111 (n=14) 14 day average 115 (n=32) 30 day average 129 (n=74)  Neuropathy: Just received lidocaine patches in the mail.  She states this limits her walking.  ABout to start using lidocaine patches  Seizures Denies seizures.  Dr [name] stopped primidone.     Objective:   Encounter Medications: Outpatient Encounter Prescriptions as of  12/18/2016  Medication Sig Note  . ACCU-CHEK FASTCLIX LANCETS MISC USE  TO CHECK BLOOD SUGAR THREE TIMES DAILY   . ACCU-CHEK SMARTVIEW test strip CHECK BLOOD SUGAR THREE TIMES DAILY   . acetaminophen (TYLENOL) 500 MG tablet Take 500 mg by mouth every 4 (four) hours as needed.    . Alcohol Swabs (B-D SINGLE USE SWABS REGULAR) PADS Use as directed   . ARIPiprazole (ABILIFY) 10 MG tablet Take 10 mg by mouth daily.   Marland Kitchen aspirin EC 81 MG tablet Take 1 tablet (81 mg total) by mouth at bedtime.   Marland Kitchen atorvastatin (LIPITOR) 10 MG tablet Take 10 mg by mouth daily.   . B Complex-C (B-COMPLEX WITH VITAMIN C) tablet Take 1 tablet by mouth daily.   . Blood Glucose Monitoring Suppl (ACCU-CHEK NANO SMARTVIEW) W/DEVICE KIT Use to check blood sugar 3 times per day dx code E11.65   . carbidopa-levodopa (SINEMET IR) 25-100 MG per tablet Take 1 tablet by mouth 3 (three) times daily.  06/22/2016: Often misses lunch time.   . carvedilol (COREG) 25 MG tablet Take 1 tablet (25 mg total) by mouth 2 (two) times daily.   . Cholecalciferol (VITAMIN D) 2000 units CAPS Take 2,000 Units by mouth daily.    . cholestyramine (QUESTRAN) 4 G packet Take 4 g by mouth 2 (two) times daily as needed.   Marland Kitchen  doxycycline (VIBRAMYCIN) 100 MG capsule Take 100 mg by mouth 2 (two) times daily.   . furosemide (LASIX) 80 MG tablet Take 1 tablet (80 mg total) by mouth daily. 12/18/2016: Two 40 mg tablets  . hydrALAZINE (APRESOLINE) 25 MG tablet Take 1 tablet (25 mg total) by mouth 2 (two) times daily.   . insulin aspart (NOVOLOG FLEXPEN) 100 UNIT/ML FlexPen Inject4- 6 units before breakfast and lunch and 10-12 units before dinner (Patient taking differently: Inject 6 units before breakfast and lunch and 12 units before dinner)   . Insulin Glargine (TOUJEO SOLOSTAR) 300 UNIT/ML SOPN Inject 40 Units into the skin daily. (Patient taking differently: Inject 36 Units into the skin daily. )   . Insulin Pen Needle 31G X 8 MM MISC Use to inject medication 4  times daily   . isosorbide mononitrate (IMDUR) 30 MG 24 hr tablet TAKE 1 TABLET (30 MG TOTAL) BY MOUTH DAILY.   Marland Kitchen JARDIANCE 10 MG TABS tablet TAKE ONE TABLET BY MOUTH ONCE DAILY WITH  BREAKFAST   . lamoTRIgine (LAMICTAL) 100 MG tablet Take 100 mg by mouth daily.   Marland Kitchen lidocaine (LIDODERM) 5 % Place 1 patch onto the skin daily. Remove & Discard patch within 12 hours or as directed by MD   . Multiple Vitamins-Minerals (MULTIVITAMIN WITH MINERALS) tablet Take 1 tablet by mouth daily.   Marland Kitchen olmesartan (BENICAR) 40 MG tablet Take 40 mg by mouth daily.   . Pancrelipase, Lip-Prot-Amyl, 25000 UNITS CPEP Take 5 capsules by mouth 3 (three) times daily. 2 caps at breakfast, 1 at lunch, 2 at dinner   . potassium chloride SA (K-DUR,KLOR-CON) 20 MEQ tablet Take 40 mEq by mouth daily.    . pregabalin (LYRICA) 100 MG capsule Take 100 mg by mouth 2 (two) times daily.   . sertraline (ZOLOFT) 50 MG tablet Take 50 mg by mouth daily.   Marland Kitchen VICTOZA 18 MG/3ML SOPN INJECT 1.8MG SUBCUTANEOUSLY DAILY (NEEDS APPOINTMENT FOR REFILLS)   . [DISCONTINUED] atorvastatin (LIPITOR) 40 MG tablet TAKE ONE TABLET BY MOUTH IN THE EVENING 10/23/2016: Taking 10 mg tablets  . [DISCONTINUED] HYDROcodone-acetaminophen (NORCO/VICODIN) 5-325 MG tablet Take 1 tablet by mouth every 6 (six) hours as needed for moderate pain.   . [DISCONTINUED] ketorolac (ACULAR) 0.4 % SOLN Place 1 drop into the left eye 4 (four) times daily.   . [DISCONTINUED] ofloxacin (OCUFLOX) 0.3 % ophthalmic solution Place 1 drop into the left eye 4 (four) times daily.   . [DISCONTINUED] PREDNISOLONE ACETATE OP Place 1 drop into the left eye 4 (four) times daily.    No facility-administered encounter medications on file as of 12/18/2016.     Functional Status: In your present state of health, do you have any difficulty performing the following activities: 10/08/2016 09/05/2016  Hearing? N N  Vision? Y Y  Difficulty concentrating or making decisions? N N  Walking or climbing  stairs? Y Y  Dressing or bathing? N N  Doing errands, shopping? [name]  Preparing Food and eating ? N N  Using the Toilet? N N  In the past six months, have you accidently leaked urine? Y Y  Do you have problems with loss of bowel control? N N  Managing your Medications? N N  Managing your Finances? N N  Housekeeping or managing your Housekeeping? N N  Some recent data might be hidden    Fall/Depression Screening: Fall Risk  10/08/2016 09/05/2016 07/23/2016  Falls in the past year? Yes Yes -  Number falls in  past yr: 2 or more 2 or more 2 or more  Injury with Fall? No No No  Risk Factor Category  High Fall Risk High Fall Risk High Fall Risk  Risk for fall due to : History of fall(s);Impaired balance/gait;Impaired mobility;Impaired vision History of fall(s);Impaired balance/gait;Impaired mobility;Impaired vision Impaired vision;History of fall(s);Impaired mobility;Impaired balance/gait  Follow up Education provided;Falls prevention discussed;Falls evaluation completed Falls evaluation completed;Education provided;Falls prevention discussed Falls evaluation completed;Education provided;Falls prevention discussed   PHQ 2/9 Scores 10/08/2016 09/05/2016 07/23/2016 06/22/2016 05/02/2016 03/29/2016 06/02/2015  PHQ - 2 Score 0 0 0 0 1 1 0    THN CM Care Plan Problem One     Most Recent Value  Care Plan Problem One  Knowledge deficit related to medication adherence as evidenced by patient report of missed medications  Role Documenting the Problem One  Clinical Pharmacist  Care Plan for Problem One  Active  THN CM Short Term Goal #1   Patient will have >85% adherence to her medications as evidenced by patient report over the next 30 days  THN CM Short Term Goal #1 Start Date  09/19/16  [facility] CM Short Term Goal #1 Met Date  12/18/16  Interventions for Short Term Goal #1  Discussed continung to utilize phone alarm reminders and importance of adherence to regimen.       Assessment: Medication Adherence:  Patient reports adherence to her regimen but does report occasionally missing mid day doses of medications.   Plan: Discussed 3 and 5 lb rule and importance of weighing herself regularly Reviewed medications with her including administration and reasons she is taking all medications.  Discussed lidocaine patch administration and use.   Encouraged to walk around house as much as tolerated.   Followup with Dr Colin Benton about new potassium (wrote by [facility] last time) and Atorvastatin.   [name] pharmacy will sign off.  Please reconsult if needed.   [name], PharmD, [place] PGY2 Pharmacy Resident [phone]

## 2016-12-19 ENCOUNTER — Other Ambulatory Visit: Payer: Self-pay | Admitting: *Deleted

## 2016-12-19 NOTE — Patient Outreach (Signed)
[facility]) Care Management  12/19/2016  GENAVIVE KUBICKI 08-Jan-1946 [id number]   RN Health Coach  Attempted #1  Follow up outreach call to patient.  Patient was unavailable. HIPPA compliance voicemail message was left with return callback number.   Plan: RN will call patient again within 14 days.    [place] Care Management [phone]

## 2016-12-24 ENCOUNTER — Telehealth: Payer: Self-pay | Admitting: *Deleted

## 2016-12-24 DIAGNOSIS — I5032 Chronic diastolic (congestive) heart failure: Secondary | ICD-10-CM

## 2016-12-24 DIAGNOSIS — Z79899 Other long term (current) drug therapy: Secondary | ICD-10-CM

## 2016-12-24 NOTE — Telephone Encounter (Signed)
Spoke to patient. Result given . Verbalized understanding CONTINUE WITH LASIX 80 MG ( TAKING 40 MG x 2)  , potassium 40 meq ( taking 20 meq x 2) Patient aware labs 01/25/17. F/u [facility] 01/29/17

## 2016-12-24 NOTE — Telephone Encounter (Signed)
-----   Message from Leonie Man, MD sent at 12/20/2016  8:27 PM EDT ----- Stable kidney function on diuretic. It does look a little worse then a few months ago, but it is still relatively normal range. We can reassess in ~4-5 weeks with planned f/u with [facility] shortly after that.Glenetta Hew, MD  Please for Dr. [name]

## 2016-12-31 DIAGNOSIS — M19072 Primary osteoarthritis, left ankle and foot: Secondary | ICD-10-CM | POA: Diagnosis not present

## 2016-12-31 DIAGNOSIS — M79671 Pain in right foot: Secondary | ICD-10-CM | POA: Diagnosis not present

## 2016-12-31 DIAGNOSIS — M65871 Other synovitis and tenosynovitis, right ankle and foot: Secondary | ICD-10-CM | POA: Diagnosis not present

## 2016-12-31 DIAGNOSIS — M65872 Other synovitis and tenosynovitis, left ankle and foot: Secondary | ICD-10-CM | POA: Diagnosis not present

## 2016-12-31 DIAGNOSIS — M19071 Primary osteoarthritis, right ankle and foot: Secondary | ICD-10-CM | POA: Diagnosis not present

## 2016-12-31 DIAGNOSIS — M722 Plantar fascial fibromatosis: Secondary | ICD-10-CM | POA: Diagnosis not present

## 2016-12-31 DIAGNOSIS — M79672 Pain in left foot: Secondary | ICD-10-CM | POA: Diagnosis not present

## 2017-01-01 ENCOUNTER — Other Ambulatory Visit: Payer: Self-pay

## 2017-01-01 DIAGNOSIS — I83893 Varicose veins of bilateral lower extremities with other complications: Secondary | ICD-10-CM

## 2017-01-02 ENCOUNTER — Ambulatory Visit: Payer: Self-pay | Admitting: *Deleted

## 2017-01-14 DIAGNOSIS — M65872 Other synovitis and tenosynovitis, left ankle and foot: Secondary | ICD-10-CM | POA: Diagnosis not present

## 2017-01-14 DIAGNOSIS — M12272 Villonodular synovitis (pigmented), left ankle and foot: Secondary | ICD-10-CM | POA: Diagnosis not present

## 2017-01-14 DIAGNOSIS — M65871 Other synovitis and tenosynovitis, right ankle and foot: Secondary | ICD-10-CM | POA: Diagnosis not present

## 2017-01-24 ENCOUNTER — Other Ambulatory Visit: Payer: Self-pay | Admitting: Endocrinology

## 2017-01-27 ENCOUNTER — Other Ambulatory Visit: Payer: Self-pay | Admitting: Cardiology

## 2017-01-29 ENCOUNTER — Encounter: Payer: Self-pay | Admitting: Physician Assistant

## 2017-01-29 ENCOUNTER — Ambulatory Visit (INDEPENDENT_AMBULATORY_CARE_PROVIDER_SITE_OTHER): Payer: Medicare HMO | Admitting: Physician Assistant

## 2017-01-29 VITALS — BP 126/64 | HR 69 | Ht 60.0 in | Wt 283.4 lb

## 2017-01-29 DIAGNOSIS — I10 Essential (primary) hypertension: Secondary | ICD-10-CM

## 2017-01-29 DIAGNOSIS — I110 Hypertensive heart disease with heart failure: Secondary | ICD-10-CM | POA: Diagnosis not present

## 2017-01-29 DIAGNOSIS — G4733 Obstructive sleep apnea (adult) (pediatric): Secondary | ICD-10-CM

## 2017-01-29 DIAGNOSIS — I5032 Chronic diastolic (congestive) heart failure: Secondary | ICD-10-CM | POA: Diagnosis not present

## 2017-01-29 DIAGNOSIS — I5033 Acute on chronic diastolic (congestive) heart failure: Secondary | ICD-10-CM | POA: Diagnosis not present

## 2017-01-29 DIAGNOSIS — Z79899 Other long term (current) drug therapy: Secondary | ICD-10-CM | POA: Diagnosis not present

## 2017-01-29 MED ORDER — METOLAZONE 2.5 MG PO TABS: 2.5000 mg | ORAL_TABLET | ORAL | 3 refills | Status: DC

## 2017-01-29 MED ORDER — METOLAZONE 2.5 MG PO TABS
2.5000 mg | ORAL_TABLET | Freq: Every day | ORAL | 3 refills | Status: DC
Start: 2017-01-29 — End: 2017-01-29

## 2017-01-29 NOTE — Progress Notes (Signed)
Cardiology Office Note   Date:  01/29/2017   ID:  [name], DOB May 05, 1946, MRN [id number]  PCP:  [name], MD  Cardiologist:  Dr. Ellyn Hack, 11/23/2016  Rosaria Ferries, PA-C 11/12/2016  Chief Complaint  Patient presents with  . Shortness of Breath    when lying down, "Fainty" feeling  . Edema    History of Present Illness: [name] is a 71 y.o. female with a history of nonocclusive CAD by cath in 2012, hypertension, obesity with obesity hypoventilation syndrome, D-CHF. She is also had near syncope with palpitations, low risk Myoview 01/2016  11/23/2016 office visit, Lasix increased to 80 mg daily, compliance with low sodium diet encouraged, consideration should be given to evaluating for possible sleep apnea increased activity encouraged. Dry weight 270-273 pounds  [name] presents for cardiology follow up.   Her weight has been up and down, but trending up. She takes an extra Lasix 80 mg once a week or so for the fluid. She has been waking up with some lower extremity edema. Her chronic dyspnea on exertion is worse. She cannot tell about PND because of her sleep apnea, and has chronic orthopnea. Her weight record shows her weight being up and down, she will gain up to 5 pounds in a day. She will lose most of it but there is a gradual upward zigzag trend.  She has had a test for sleep apnea and was positive. However, she could not afford the equipment so is not on CPAP.  Her sister is cooking fresh food and rinsing anything frozen. She is not adding salt. She is eating out less. She is trying to increase her activity, but the heat makes it tough. She has gone to [employer], but there are a lot of children there. She and her sister go to the grocery store regularly, She says she can walk more there she needs to.   Past Medical History:  Diagnosis Date  . Abnormal liver function     in the past.  . Anemia   . Arthritis    all over  . Bruises  easily   . Cataract    right eye;immature  . Chronic back pain    stenosis  . Chronic cough   . Demand myocardial infarction [facility]) 2012   Demand Infarction in setting of Pancreatitis --> mild Troponin elevation, Non-obstructive CAD  . Depression    takes Abilify daily as well as Zoloft  . Diastolic heart failure 6387   Grade 1 diastolic Dysfunction by Echo   . Diverticulosis   . DM (diabetes mellitus) ([place])    takes Victoza daily as well as Lantus and Humalog  . Empty sella ([place])    on MRI in 2009.  . Glaucoma   . Headache(784.0)    last migraine-4-36yr ago  . History of blood transfusion    no abnormal reaction noted  . History of colon polyps    benign  . History of influenza    antibiotic given yesterday along with taking Delsym cough syrup  . HTN (hypertension)    takes BKinder Morgan Energydaily  . Hyperlipidemia    takes Lipitor daily  . Joint swelling   . Nocturia   . Obstructive sleep apnea   . Pancreatitis    takes Pancrelipase daily  . Parkinson's disease ([place]    takes Sinemet daily  . Peripheral edema    takes Lasix daily  . Peripheral neuropathy   .  Pneumonia 2012  . Seizure disorder ([place])   . Seizures ([place])    takes Lamictal daily and Primidone nightly;last seizure 2wks ago  . Urinary frequency   . Urinary urgency     Past Surgical History:  Procedure Laterality Date  . ABDOMINAL HYSTERECTOMY    . APPENDECTOMY    . BACK SURGERY    . CARDIAC CATHETERIZATION  2009/March 2012   2009: (Dr. Alvie Heidelberg) Nonobstructive CAD; 2012: Minimal CAD --> false positive stress test  . Cardiac Event Monitor  September-October 2017   Sinus rhythm with occasional PACs and artifact. No arrhythmias besides one short run of tachycardia.  . CHOLECYSTECTOMY    . COLONOSCOPY N/A 08/15/2012   Procedure: COLONOSCOPY;  Surgeon: Arta Silence, MD;  Location: [facility] ENDOSCOPY;  Service: Endoscopy;  Laterality: N/A;  . ESOPHAGOGASTRODUODENOSCOPY    . eye cysts Bilateral   .  LASIK    . NM MYOVIEW LTD  March 2012; Aug 2017   Probably normal Lexiscan Myoview. Mild reversible Anterior Defect ~ Breast Attenuation. (CRO ischemia):  FALSE POSITIVE by Cath; b) 8/'17: LOW RISK, No ischemia or Infarction.  Breast Attenuation not noted.  Marland Kitchen NM MYOVIEW LTD  01/2016   LOW RISK. No ischemia or infarction.  Marland Kitchen RECTAL POLYPECTOMY    . TONSILLECTOMY    . TRANSTHORACIC ECHOCARDIOGRAM  01/2016   Normal LV chamber size with moderate LVH pattern. EF 50-55%. Severe LA dilation. Moderate RA dilation. PA pressure elevated at 38 mmHg (mild as (  . TUBAL LIGATION    . vaginal cyst removed     several times    Medication Sig  . ACCU-CHEK FASTCLIX LANCETS MISC USE  TO CHECK BLOOD SUGAR THREE TIMES DAILY  . ACCU-CHEK SMARTVIEW test strip CHECK BLOOD SUGAR THREE TIMES DAILY  . acetaminophen (TYLENOL) 500 MG tablet Take 500 mg by mouth every 4 (four) hours as needed.   . Alcohol Swabs (B-D SINGLE USE SWABS REGULAR) PADS Use as directed  . ARIPiprazole (ABILIFY) 10 MG tablet Take 10 mg by mouth daily.  Marland Kitchen aspirin EC 81 MG tablet Take 1 tablet (81 mg total) by mouth at bedtime.  Marland Kitchen atorvastatin (LIPITOR) 10 MG tablet Take 10 mg by mouth daily.  . B Complex-C (B-COMPLEX WITH VITAMIN C) tablet Take 1 tablet by mouth daily.  . Blood Glucose Monitoring Suppl (ACCU-CHEK NANO SMARTVIEW) W/DEVICE KIT Use to check blood sugar 3 times per day dx code E11.65  . carbidopa-levodopa (SINEMET IR) 25-100 MG per tablet Take 1 tablet by mouth 3 (three) times daily.   . carvedilol (COREG) 25 MG tablet Take 1 tablet (25 mg total) by mouth 2 (two) times daily.  . Cholecalciferol (VITAMIN D) 2000 units CAPS Take 2,000 Units by mouth daily.   . cholestyramine (QUESTRAN) 4 G packet Take 4 g by mouth 2 (two) times daily as needed.  . furosemide (LASIX) 80 MG tablet Take 1 tablet (80 mg total) by mouth daily.  . hydrALAZINE (APRESOLINE) 25 MG tablet TAKE 1 TABLET BY MOUTH TWICE DAILY  . insulin aspart (NOVOLOG  FLEXPEN) 100 UNIT/ML FlexPen Inject4- 6 units before breakfast and lunch and 10-12 units before dinner (Patient taking differently: Inject 6 units before breakfast and lunch and 12 units before dinner)  . Insulin Glargine (TOUJEO SOLOSTAR) 300 UNIT/ML SOPN Inject 40 Units into the skin daily. (Patient taking differently: Inject 36 Units into the skin daily. )  . Insulin Pen Needle 31G X 8 MM MISC Use to inject medication 4 times daily  .  isosorbide mononitrate (IMDUR) 30 MG 24 hr tablet TAKE 1 TABLET (30 MG TOTAL) BY MOUTH DAILY.  Marland Kitchen JARDIANCE 10 MG TABS tablet TAKE ONE TABLET BY MOUTH ONCE DAILY WITH  BREAKFAST  . lamoTRIgine (LAMICTAL) 100 MG tablet Take 100 mg by mouth daily.  Marland Kitchen lidocaine (LIDODERM) 5 % Place 1 patch onto the skin daily. Remove & Discard patch within 12 hours or as directed by MD  . Menthol, Topical Analgesic, 3.5 % GEL Apply 1 application topically as needed. Foot pain  . Multiple Vitamins-Minerals (MULTIVITAMIN WITH MINERALS) tablet Take 1 tablet by mouth daily.  Marland Kitchen olmesartan (BENICAR) 40 MG tablet Take 40 mg by mouth daily.  . Pancrelipase, Lip-Prot-Amyl, 25000 UNITS CPEP Take 5 capsules by mouth 3 (three) times daily. 2 caps at breakfast, 1 at lunch, 2 at dinner  . potassium chloride SA (K-DUR,KLOR-CON) 20 MEQ tablet Take 40 mEq by mouth daily.   . pregabalin (LYRICA) 100 MG capsule Take 100 mg by mouth 2 (two) times daily.  . sertraline (ZOLOFT) 50 MG tablet Take 50 mg by mouth daily.  Marland Kitchen VICTOZA 18 MG/3ML SOPN INJECT 1.8MG SUBCUTANEOUSLY DAILY (NEEDS APPOINTMENT FOR REFILLS)   No current facility-administered medications for this visit.     Allergies:   Penicillins; Sulfa antibiotics; Codeine; and Other    Social History:  The patient  reports that she has quit smoking. Her smoking use included Cigarettes. She has a 12.50 pack-year smoking history. She has never used smokeless tobacco. She reports that she does not drink alcohol or use drugs.   Family History:  The  patient's family history includes Allergies in her father; Cancer in her other; Diabetes in her brother, mother, sister, and son; Heart disease in her father, mother, and sister; Heart failure in her father; Hyperlipidemia in her father, mother, and sister; Hypertension in her brother, father, mother, sister, sister, and son.    ROS:  Please see the history of present illness. All other systems are reviewed and negative.    PHYSICAL EXAM: VS:  BP 126/64   Pulse 69   Ht 5' (1.524 m)   Wt 283 lb 6.4 oz (128.5 kg)   BMI 55.35 kg/m  , BMI Body mass index is 55.35 kg/m. GEN: Well nourished, well developed, female in no acute distress  HEENT: normal for age  Neck: minimal JVD seen, difficult to assess secondary to body habitus, no carotid bruit, no masses Cardiac: RRR; 2/6 murmur, no rubs, or gallops Respiratory:  Rales bases bilaterally, normal work of breathing GI: soft, nontender, nondistended, + BS MS: no deformity or atrophy; one-2+ lower extremity edema; distal pulses are 2+ in all upper extremities; some of her varicose veins are swollen and distended   Skin: warm and dry, no rash Neuro:  Strength and sensation are intact Psych: euthymic mood, full affect   EKG:  EKG is ordered today. The ekg ordered today demonstrates sinus rhythm, PACs and blocked PACs, incomplete right bundle branch block, no significant change   Recent Labs: 12/07/2016: BUN 17; Creatinine, Ser 1.04; Potassium 5.1; Sodium 138    Lipid Panel    Component Value Date/Time   CHOL 135 09/30/2015 1047   TRIG 71.0 09/30/2015 1047   HDL 62.50 09/30/2015 1047   CHOLHDL 2 09/30/2015 1047   VLDL 14.2 09/30/2015 1047   LDLCALC 59 09/30/2015 1047   LDLDIRECT 66.0 01/27/2015 1104     Wt Readings from Last 3 Encounters:  01/29/17 283 lb 6.4 oz (128.5 kg)  12/18/16 277  lb (125.6 kg)  11/23/16 278 lb 3.2 oz (126.2 kg)     Other studies Reviewed: Additional studies/ records that were reviewed today include:  Office notes and testing.  ASSESSMENT AND PLAN:  1.  Acute on chronic diastolic CHF: She is compliant with her Lasix and has been taking her dose at times. I reviewed her weight record and her weight is definitely variable, but there is a slight general upper trend. She has extra volume on board but I think she has also gained some weight.  For the extra volume, I have asked her to take metolazone 2.5 mg once a week. I will not add potassium at this time since I don't know what her needs will be and her potassium was 4.7 the last time it was checked. We'll check a BMET today and decide on additional potassium supplementation once that is reviewed. It is okay to continue to take the extra Lasix as needed. From a dietary standpoint, she is doing much better at a low sodium diet.  2. Morbid obesity: She is doing much better from a sodium standpoint with the healthier diet. Her sister is cooking food and was present today. I have requested that she do portion control and made some suggestions of things that she can eat which will help her to feel full without adding a lot of calories.  3. Hypertension: Her blood pressures well controlled on current medications, we will have to keep an eye on this with the addition of the metolazone.  4. Sleep apnea: She had the study within the last year or so and it was positive. However, she could not afford the equipment. She is requested to go back to the person that prescribed the equipment and discuss with him if there are any less expensive options.   Current medicines are reviewed at length with the patient today.  The patient does not have concerns regarding medicines.  The following changes have been made:   Add weekly metolazone  Labs/ tests ordered today include:   Orders Placed This Encounter  Procedures  . Basic metabolic panel  . EKG 12-Lead     Disposition:   FU with Dr. Ellyn Hack  Signed, Barrett, [name], PA-C  01/29/2017 1:19 PM    [place] Phone: [phone]; Fax: [phone]  This note was written with the assistance of speech recognition software. Please excuse any transcriptional errors.

## 2017-01-29 NOTE — Patient Instructions (Signed)
Medication Instructions:  Start metolazone 2.5mg  weekly-30 MINUTES BEFORE LASIX  If you need a refill on your cardiac medications before your next appointment, please call your pharmacy.  Labwork: BMP TODAY HERE IN OUR OFFICE AT [facility]  Follow-Up: Your physician wants you to follow-up on: 03-14-2017 WITH DR HARDING.   Special Instructions: TRY TO IMPROVE PORTION CONTROL  TRY TO DECREASE YOUR WEIGHT  DISCUSS CPAP WITH YOUR PCP  CONTINUE LOW SODIUM DIET  Thank you for choosing [facility] at [employer]!!     LOW SODIUM DIET (DASH Eating Plan) DASH stands for "Dietary Approaches to Stop Hypertension." The DASH eating plan is a healthy eating plan that has been shown to reduce high blood pressure (hypertension). It may also reduce your risk for type 2 diabetes, heart disease, and stroke. The DASH eating plan may also help with weight loss. What are tips for following this plan? General guidelines  Avoid eating more than 2,300 mg (milligrams) of salt (sodium) a day. If you have hypertension, you may need to reduce your sodium intake to 1,500 mg a day.  Limit alcohol intake to no more than 1 drink a day for nonpregnant women and 2 drinks a day for men. One drink equals 12 oz of beer, 5 oz of wine, or 1 oz of hard liquor.  Work with your health care provider to maintain a healthy body weight or to lose weight. Ask what an ideal weight is for you.  Get at least 30 minutes of exercise that causes your heart to beat faster (aerobic exercise) most days of the week. Activities may include walking, swimming, or biking.  Work with your health care provider or diet and nutrition specialist (dietitian) to adjust your eating plan to your individual calorie needs. Reading food labels  Check food labels for the amount of sodium per serving. Choose foods with less than 5 percent of the Daily Value of sodium. Generally, foods with less than 300 mg of sodium per serving fit into this eating  plan.  To find whole grains, look for the word "whole" as the first word in the ingredient list. Shopping  Buy products labeled as "low-sodium" or "no salt added."  Buy fresh foods. Avoid canned foods and premade or frozen meals. Cooking  Avoid adding salt when cooking. Use salt-free seasonings or herbs instead of table salt or sea salt. Check with your health care provider or pharmacist before using salt substitutes.  Do not fry foods. Cook foods using healthy methods such as baking, boiling, grilling, and broiling instead.  Cook with heart-healthy oils, such as olive, canola, soybean, or sunflower oil. Meal planning   Eat a balanced diet that includes: ? 5 or more servings of fruits and vegetables each day. At each meal, try to fill half of your plate with fruits and vegetables. ? Up to 6-8 servings of whole grains each day. ? Less than 6 oz of lean meat, poultry, or fish each day. A 3-oz serving of meat is about the same size as a deck of cards. One egg equals 1 oz. ? 2 servings of low-fat dairy each day. ? A serving of nuts, seeds, or beans 5 times each week. ? Heart-healthy fats. Healthy fats called Omega-3 fatty acids are found in foods such as flaxseeds and coldwater fish, like sardines, salmon, and mackerel.  Limit how much you eat of the following: ? Canned or prepackaged foods. ? Food that is high in trans fat, such as fried foods. ? Food that  is high in saturated fat, such as fatty meat. ? Sweets, desserts, sugary drinks, and other foods with added sugar. ? Full-fat dairy products.  Do not salt foods before eating.  Try to eat at least 2 vegetarian meals each week.  Eat more home-cooked food and less restaurant, buffet, and fast food.  When eating at a restaurant, ask that your food be prepared with less salt or no salt, if possible. What foods are recommended? The items listed may not be a complete list. Talk with your dietitian about what dietary choices are best  for you. Grains Whole-grain or whole-wheat bread. Whole-grain or whole-wheat pasta. Brown rice. [place]. Bulgur. Whole-grain and low-sodium cereals. Pita bread. Low-fat, low-sodium crackers. Whole-wheat flour tortillas. Vegetables Fresh or frozen vegetables (raw, steamed, roasted, or grilled). Low-sodium or reduced-sodium tomato and vegetable juice. Low-sodium or reduced-sodium tomato sauce and tomato paste. Low-sodium or reduced-sodium canned vegetables. Fruits All fresh, dried, or frozen fruit. Canned fruit in natural juice (without added sugar). Meat and other protein foods Skinless chicken or [place]. Ground chicken or [place]. Pork with fat trimmed off. Fish and seafood. Egg whites. Dried beans, peas, or lentils. Unsalted nuts, nut butters, and seeds. Unsalted canned beans. Lean cuts of beef with fat trimmed off. Low-sodium, lean deli meat. Dairy Low-fat (1%) or fat-free (skim) milk. Fat-free, low-fat, or reduced-fat cheeses. Nonfat, low-sodium ricotta or cottage cheese. Low-fat or nonfat yogurt. Low-fat, low-sodium cheese. Fats and oils Soft margarine without trans fats. Vegetable oil. Low-fat, reduced-fat, or light mayonnaise and salad dressings (reduced-sodium). Canola, safflower, olive, soybean, and sunflower oils. Avocado. Seasoning and other foods Herbs. Spices. Seasoning mixes without salt. Unsalted popcorn and pretzels. Fat-free sweets. What foods are not recommended? The items listed may not be a complete list. Talk with your dietitian about what dietary choices are best for you. Grains Baked goods made with fat, such as croissants, muffins, or some breads. Dry pasta or rice meal packs. Vegetables Creamed or fried vegetables. Vegetables in a cheese sauce. Regular canned vegetables (not low-sodium or reduced-sodium). Regular canned tomato sauce and paste (not low-sodium or reduced-sodium). Regular tomato and vegetable juice (not low-sodium or reduced-sodium). Angie Fava.  Olives. Fruits Canned fruit in a light or heavy syrup. Fried fruit. Fruit in cream or butter sauce. Meat and other protein foods Fatty cuts of meat. Ribs. Fried meat. Berniece Salines. Sausage. Bologna and other processed lunch meats. Salami. Fatback. Hotdogs. Bratwurst. Salted nuts and seeds. Canned beans with added salt. Canned or smoked fish. Whole eggs or egg yolks. Chicken or [place] with skin. Dairy Whole or 2% milk, cream, and half-and-half. Whole or full-fat cream cheese. Whole-fat or sweetened yogurt. Full-fat cheese. Nondairy creamers. Whipped toppings. Processed cheese and cheese spreads. Fats and oils Butter. Stick margarine. Lard. Shortening. Ghee. Bacon fat. Tropical oils, such as coconut, palm kernel, or palm oil. Seasoning and other foods Salted popcorn and pretzels. Onion salt, garlic salt, seasoned salt, table salt, and sea salt. Worcestershire sauce. Tartar sauce. Barbecue sauce. Teriyaki sauce. Soy sauce, including reduced-sodium. Steak sauce. Canned and packaged gravies. Fish sauce. Oyster sauce. Cocktail sauce. Horseradish that you find on the shelf. Ketchup. Mustard. Meat flavorings and tenderizers. Bouillon cubes. Hot sauce and Tabasco sauce. Premade or packaged marinades. Premade or packaged taco seasonings. Relishes. Regular salad dressings. Where to find more information:  National Heart, Lung, and [place]: https://wilson-eaton.com/  American Heart Association: www.heart.org Summary  The DASH eating plan is a healthy eating plan that has been shown to reduce high blood pressure (hypertension).  It may also reduce your risk for type 2 diabetes, heart disease, and stroke.  With the DASH eating plan, you should limit salt (sodium) intake to 2,300 mg a day. If you have hypertension, you may need to reduce your sodium intake to 1,500 mg a day.  When on the DASH eating plan, aim to eat more fresh fruits and vegetables, whole grains, lean proteins, low-fat dairy, and heart-healthy  fats.  Work with your health care provider or diet and nutrition specialist (dietitian) to adjust your eating plan to your individual calorie needs. This information is not intended to replace advice given to you by your health care provider. Make sure you discuss any questions you have with your health care provider. Document Released: 05/31/2011 Document Revised: 06/04/2016 Document Reviewed: 06/04/2016 Elsevier Interactive Patient Education  2017 [employer].

## 2017-01-30 LAB — BASIC METABOLIC PANEL
BUN/Creatinine Ratio: 25 (ref 12–28)
BUN: 24 mg/dL (ref 8–27)
CO2: 23 mmol/L (ref 20–29)
Calcium: 9 mg/dL (ref 8.7–10.3)
Chloride: 97 mmol/L (ref 96–106)
Creatinine, Ser: 0.95 mg/dL (ref 0.57–1.00)
GFR calc Af Amer: 71 mL/min/{1.73_m2} (ref >59)
GFR calc non Af Amer: 62 mL/min/{1.73_m2} (ref >59)
Glucose: 131 mg/dL — ABNORMAL HIGH (ref 65–99)
Potassium: 4.3 mmol/L (ref 3.5–5.2)
Sodium: 136 mmol/L (ref 134–144)

## 2017-01-31 ENCOUNTER — Telehealth: Payer: Self-pay | Admitting: Vascular Surgery

## 2017-01-31 NOTE — Telephone Encounter (Signed)
-----   Message from Rica Records, RN sent at 01/31/2017 11:42 AM EDT ----- Regarding: type of appointment [name] is scheduled for venous reflux and to see JDL on 02-04-2017.  She has been scheduled as a [facility] FU.  [facility] has [facility] seen at [facility] since 2013 so she needs to be a new [facility].  Please keep her appointment/it just needs to be corrected to right type of appointment. Thanks!

## 2017-02-01 ENCOUNTER — Telehealth: Payer: Self-pay | Admitting: *Deleted

## 2017-02-01 ENCOUNTER — Encounter: Payer: Self-pay | Admitting: Vascular Surgery

## 2017-02-01 MED ORDER — POTASSIUM CHLORIDE CRYS ER 20 MEQ PO TBCR: 40.0000 meq | EXTENDED_RELEASE_TABLET | Freq: Every day | ORAL | Status: DC

## 2017-02-01 NOTE — Telephone Encounter (Signed)
Patient made aware of results and current medication instructions.   Notes recorded by Almyra Deforest, PA on 02/01/2017 at 3:07 PM EDT Renal function and electrolyte stable, she is taking metolazone once a week, on the day she take the metolazone, she should take an additional 29meq dose of potassium supplement, otherwise potassium 28meq daily on all other days

## 2017-02-02 ENCOUNTER — Other Ambulatory Visit: Payer: Self-pay | Admitting: Cardiology

## 2017-02-04 ENCOUNTER — Ambulatory Visit (HOSPITAL_COMMUNITY)
Admission: RE | Admit: 2017-02-04 | Discharge: 2017-02-04 | Disposition: A | Discharge status: Home / Self Care | Payer: Medicare HMO | Source: Ambulatory Visit | Attending: Vascular Surgery | Admitting: Vascular Surgery

## 2017-02-04 ENCOUNTER — Ambulatory Visit (INDEPENDENT_AMBULATORY_CARE_PROVIDER_SITE_OTHER): Payer: Medicare HMO | Admitting: Vascular Surgery

## 2017-02-04 ENCOUNTER — Encounter: Payer: Self-pay | Admitting: Vascular Surgery

## 2017-02-04 VITALS — BP 94/61 | HR 60 | Temp 97.1°F | Resp 14 | Ht 60.0 in | Wt 274.0 lb

## 2017-02-04 DIAGNOSIS — I83893 Varicose veins of bilateral lower extremities with other complications: Secondary | ICD-10-CM

## 2017-02-04 NOTE — Progress Notes (Signed)
Subjective:     Patient ID: [name], female   DOB: 1945-08-24, 71 y.o.   MRN: [id number]  HPI This 71 year old female was referred by Dr. [name] for evaluation of bilateral varicose veins with pain. The patient was previously evaluated by Dr. Adele Barthel in 2013. Patient has a burning discomfort in the lateral thigh which frequently occurs at night. Lidocaine patches have been utilized without success. Patient does develop swelling in both feet. She does not have a history of DVT thrombophlebitis stasis ulcers or bleeding. She is unable to wear elastic compression stockings because of the size of her thighs.  Past Medical History:  Diagnosis Date  . Abnormal liver function     in the past.  . Anemia   . Arthritis    all over  . Bruises easily   . Cataract    right eye;immature  . Chronic back pain    stenosis  . Chronic cough   . Demand myocardial infarction [facility]) 2012   Demand Infarction in setting of Pancreatitis --> mild Troponin elevation, Non-obstructive CAD  . Depression    takes Abilify daily as well as Zoloft  . Diastolic heart failure 0301   Grade 1 diastolic Dysfunction by Echo   . Diverticulosis   . DM (diabetes mellitus) ([place])    takes Victoza daily as well as Lantus and Humalog  . Empty sella ([place])    on MRI in 2009.  . Glaucoma   . Headache(784.0)    last migraine-4-50yr ago  . History of blood transfusion    no abnormal reaction noted  . History of colon polyps    benign  . History of influenza    antibiotic given yesterday along with taking Delsym cough syrup  . HTN (hypertension)    takes BKinder Morgan Energydaily  . Hyperlipidemia    takes Lipitor daily  . Joint swelling   . Nocturia   . Obstructive sleep apnea   . Pancreatitis    takes Pancrelipase daily  . [place]'s [place]    takes Sinemet daily  . Peripheral edema    takes Lasix daily  . Peripheral neuropathy   . Pneumonia 2012  . [place]ure [place] ([place]   .  [place]izures ([place]    takes Lamictal daily and Primidone nightly;last seizure 2wks ago  . Urinary frequency   . Urinary urgency   . Varicose veins of both lower extremities with pain     Social History  Substance Use Topics  . Smoking status: Former Smoker    Packs/day: 0.50    Years: 25.00    Types: Cigarettes  . Smokeless tobacco: Never Used     Comment: quit smoking 20+ytrs ago  . Alcohol use No    Family History  Problem Relation Age of Onset  . Allergies Father   . Heart disease Father        before 650 . Heart failure Father   . Hypertension Father   . Hyperlipidemia Father   . Heart disease Mother   . Diabetes Mother   . Hypertension Mother   . Hyperlipidemia Mother   . Hypertension Sister   . Heart disease Sister        before 654 . Hyperlipidemia Sister   . Diabetes Brother   . Hypertension Brother   . Diabetes Sister   . Hypertension Sister   . Diabetes Son   . Hypertension Son   . Cancer Other  Allergies  Allergen Reactions  . Penicillins Hives  . Sulfa Antibiotics Hives  . Codeine     Headache and "just feel off"  . Other Rash    Bleach     Current Outpatient Prescriptions:  .  ACCU-CHEK FASTCLIX LANCETS MISC, USE  TO CHECK BLOOD SUGAR THREE TIMES DAILY, Disp: 306 each, Rfl: 0 .  ACCU-CHEK SMARTVIEW test strip, CHECK BLOOD SUGAR THREE TIMES DAILY, Disp: 300 each, Rfl: 1 .  acetaminophen (TYLENOL) 500 MG tablet, Take 500 mg by mouth every 4 (four) hours as needed. , Disp: , Rfl:  .  Alcohol Swabs (B-D SINGLE USE SWABS REGULAR) PADS, Use as directed, Disp: 300 each, Rfl: 1 .  ARIPiprazole (ABILIFY) 10 MG tablet, Take 10 mg by mouth daily., Disp: , Rfl:  .  aspirin EC 81 MG tablet, Take 1 tablet (81 mg total) by mouth at bedtime., Disp: , Rfl:  .  atorvastatin (LIPITOR) 10 MG tablet, Take 10 mg by mouth daily., Disp: , Rfl:  .  B Complex-C (B-COMPLEX WITH VITAMIN C) tablet, Take 1 tablet by mouth daily., Disp: , Rfl:  .  Blood Glucose Monitoring  Suppl (ACCU-CHEK NANO SMARTVIEW) W/DEVICE KIT, Use to check blood sugar 3 times per day dx code E11.65, Disp: 1 kit, Rfl: 0 .  carbidopa-levodopa (SINEMET IR) 25-100 MG per tablet, Take 1 tablet by mouth 3 (three) times daily. , Disp: , Rfl:  .  carvedilol (COREG) 25 MG tablet, Take 1 tablet (25 mg total) by mouth 2 (two) times daily., Disp: 180 tablet, Rfl: 3 .  Cholecalciferol (VITAMIN D) 2000 units CAPS, Take 2,000 Units by mouth daily. , Disp: , Rfl:  .  cholestyramine (QUESTRAN) 4 G packet, Take 4 g by mouth 2 (two) times daily as needed., Disp: , Rfl:  .  furosemide (LASIX) 40 MG tablet, TAKE 1 TABLET TWICE DAILY ON MONDAY, WEDNESDAY AND FRIDAY.  TAKE 1 TABLET ONE TIME DAILY ON ALL OTHER DAYS, Disp: 130 tablet, Rfl: 3 .  furosemide (LASIX) 80 MG tablet, Take 1 tablet (80 mg total) by mouth daily., Disp: 90 tablet, Rfl: 3 .  hydrALAZINE (APRESOLINE) 25 MG tablet, TAKE 1 TABLET BY MOUTH TWICE DAILY, Disp: 60 tablet, Rfl: 3 .  insulin aspart (NOVOLOG FLEXPEN) 100 UNIT/ML FlexPen, Inject4- 6 units before breakfast and lunch and 10-12 units before dinner (Patient taking differently: Inject 6 units before breakfast and lunch and 12 units before dinner), Disp: 30 mL, Rfl: 3 .  Insulin Glargine (TOUJEO SOLOSTAR) 300 UNIT/ML SOPN, Inject 40 Units into the skin daily. (Patient taking differently: Inject 36 Units into the skin daily. ), Disp: 18 mL, Rfl: 1 .  Insulin Pen Needle 31G X 8 MM MISC, Use to inject medication 4 times daily, Disp: 400 each, Rfl: 1 .  isosorbide mononitrate (IMDUR) 30 MG 24 hr tablet, TAKE 1 TABLET (30 MG TOTAL) BY MOUTH DAILY., Disp: 90 tablet, Rfl: 3 .  JARDIANCE 10 MG TABS tablet, TAKE ONE TABLET BY MOUTH ONCE DAILY WITH  BREAKFAST, Disp: 30 tablet, Rfl: 3 .  lamoTRIgine (LAMICTAL) 100 MG tablet, Take 100 mg by mouth daily., Disp: , Rfl:  .  lidocaine (LIDODERM) 5 %, Place 1 patch onto the skin daily. Remove & Discard patch within 12 hours or as directed by MD, Disp: , Rfl:  .   Menthol, Topical Analgesic, 3.5 % GEL, Apply 1 application topically as needed. Foot pain, Disp: , Rfl:  .  metolazone (ZAROXOLYN) 2.5 MG tablet, Take 1  tablet (2.5 mg total) by mouth once a week., Disp: 4 tablet, Rfl: 3 .  Multiple Vitamins-Minerals (MULTIVITAMIN WITH MINERALS) tablet, Take 1 tablet by mouth daily., Disp: , Rfl:  .  olmesartan (BENICAR) 40 MG tablet, Take 40 mg by mouth daily., Disp: , Rfl:  .  Pancrelipase, Lip-Prot-Amyl, 25000 UNITS CPEP, Take 5 capsules by mouth 3 (three) times daily. 2 caps at breakfast, 1 at lunch, 2 at dinner, Disp: , Rfl:  .  potassium chloride SA (K-DUR,KLOR-CON) 20 MEQ tablet, Take 2 tablets (40 mEq total) by mouth daily. On the day you take metolazone, take an extra 20 meq (60 meq total), Disp: , Rfl:  .  pregabalin (LYRICA) 100 MG capsule, Take 100 mg by mouth 2 (two) times daily., Disp: , Rfl:  .  sertraline (ZOLOFT) 50 MG tablet, Take 50 mg by mouth daily., Disp: , Rfl:  .  VICTOZA 18 MG/3ML SOPN, INJECT 1.8MG SUBCUTANEOUSLY DAILY (NEEDS APPOINTMENT FOR REFILLS), Disp: 27 mL, Rfl: 0  Vitals:   02/04/17 1321  BP: 94/61  Pulse: 60  Resp: 14  Temp: (!) 97.1 F (36.2 C)  SpO2: 94%  Weight: 274 lb (124.3 kg)  Height: 5' (1.524 m)    Body mass index is 53.51 kg/m.         Review of Systems Patient has history of Parkinson's disease, obstructive sleep apnea, hyperlipidemia, denies chest pain does have dyspnea on exertion. Has history of chronic morbid obesity.    Objective:   Physical Exam BP 94/61 (BP Location: Left Arm, Patient Position: Sitting, Cuff Size: Large)   Pulse 60   Temp (!) 97.1 F (36.2 C)   Resp 14   Ht 5' (1.524 m)   Wt 274 lb (124.3 kg)   SpO2 94%   BMI 53.51 kg/m     Gen.-alert and oriented x3 in no apparent distress-morbidly obese HEENT normal for age Lungs no rhonchi or wheezing Cardiovascular regular rhythm no murmurs carotid pulses 3+ palpable no bruits audible Abdomen soft nontender no palpable  masses-morbidly obese Musculoskeletal free of  major deformities Skin clear -no rashes Neurologic normal Lower extremities 3+ femoral and dorsalis pedis pulses palpable bilaterally with trace bilateral edema Diffuse spider and reticular veins bilateral lower extremities with 2 prominent areas medially over the great saphenous vein midway between knee and medial malleolus. No hyperpigmentation or ulceration noted. Diffuse spider and reticular veins lateral thigh areas. A few small bulging varicosities in the medial calf.  Today I ordered bilateral venous reflux exam chart reviewed and interpreted. There is no DVT. There is enlargement of both great saphenous vein but no reflux below the saphenofemoral junction bilaterally.       Assessment:     Diffuse spider and reticular veins with no evidence of significant reflux and great saphenous veins other than the saphenofemoral junction Burning discomfort left lateral thigh possibly due to nerve entrapment syndrome-doubt vascular in origin    Plan:     Explained to patient that any treatment would consist of sclerotherapy and that there is no indication for laser ablation because of lack of reflux within the great saphenous veins in that treatment would not be covered by insurance She will be back in touch with [place] if she would desire treatment

## 2017-02-05 ENCOUNTER — Telehealth: Payer: Self-pay | Admitting: Cardiology

## 2017-02-05 NOTE — Telephone Encounter (Signed)
See results note. 

## 2017-02-05 NOTE — Telephone Encounter (Signed)
[name] is returning a call . Thanks

## 2017-02-06 ENCOUNTER — Ambulatory Visit: Payer: Medicare HMO | Admitting: Vascular Surgery

## 2017-02-06 ENCOUNTER — Encounter (HOSPITAL_COMMUNITY): Payer: Medicare HMO

## 2017-02-15 ENCOUNTER — Other Ambulatory Visit: Payer: Self-pay | Admitting: *Deleted

## 2017-02-15 DIAGNOSIS — Z794 Long term (current) use of insulin: Principal | ICD-10-CM

## 2017-02-15 DIAGNOSIS — E0800 Diabetes mellitus due to underlying condition with hyperosmolarity without nonketotic hyperglycemic-hyperosmolar coma (NKHHC): Secondary | ICD-10-CM

## 2017-02-15 NOTE — Patient Outreach (Signed)
[facility]) Care Management  02/15/2017   [name] Sep 13, 1945 [id number]  RN Health Coach telephone call to patient.  Hipaa compliance verified. Per patient he fasting blood sugar today was 164. Patient A1C was 6.9.  Patient stated she is having trouble with her blood sugars that they are all over the place. Per patient she eats breakfast and then lunch and dinner. She does not eat a healthy snack in between so she is so hungry that she gets oversize portions of food at each meal. RN discussed with the patient about portion control with the breakfast lunch and dinner and eating healthy snacks in between to help control the hunger and stabilize the blood sugars.  Patient  has developed an exercise routine. She is going to the aquatic center and the [facility]. She is doing leg exercises in the morning. Per patient she took a sleep apnea test and failed. She needs a CPAP but is unable to afford it. Patient has agreed to follow up outreach calls. Per patient if unable to reach her by her phone listed call her sister phone to reach her at [phone].  Current Medications:  Current Outpatient Prescriptions  Medication Sig Dispense Refill  . ACCU-CHEK FASTCLIX LANCETS MISC USE  TO CHECK BLOOD SUGAR THREE TIMES DAILY 306 each 0  . ACCU-CHEK SMARTVIEW test strip CHECK BLOOD SUGAR THREE TIMES DAILY 300 each 1  . acetaminophen (TYLENOL) 500 MG tablet Take 500 mg by mouth every 4 (four) hours as needed.     . Alcohol Swabs (B-D SINGLE USE SWABS REGULAR) PADS Use as directed 300 each 1  . ARIPiprazole (ABILIFY) 10 MG tablet Take 10 mg by mouth daily.    Marland Kitchen aspirin EC 81 MG tablet Take 1 tablet (81 mg total) by mouth at bedtime.    Marland Kitchen atorvastatin (LIPITOR) 10 MG tablet Take 10 mg by mouth daily.    . B Complex-C (B-COMPLEX WITH VITAMIN C) tablet Take 1 tablet by mouth daily.    . Blood Glucose Monitoring Suppl (ACCU-CHEK NANO SMARTVIEW) W/DEVICE KIT Use to check blood sugar 3 times per  day dx code E11.65 1 kit 0  . carbidopa-levodopa (SINEMET IR) 25-100 MG per tablet Take 1 tablet by mouth 3 (three) times daily.     . carvedilol (COREG) 25 MG tablet Take 1 tablet (25 mg total) by mouth 2 (two) times daily. 180 tablet 3  . Cholecalciferol (VITAMIN D) 2000 units CAPS Take 2,000 Units by mouth daily.     . cholestyramine (QUESTRAN) 4 G packet Take 4 g by mouth 2 (two) times daily as needed.    . furosemide (LASIX) 40 MG tablet TAKE 1 TABLET TWICE DAILY ON MONDAY, WEDNESDAY AND FRIDAY.  TAKE 1 TABLET ONE TIME DAILY ON ALL OTHER DAYS 130 tablet 3  . furosemide (LASIX) 80 MG tablet Take 1 tablet (80 mg total) by mouth daily. 90 tablet 3  . hydrALAZINE (APRESOLINE) 25 MG tablet TAKE 1 TABLET BY MOUTH TWICE DAILY 60 tablet 3  . insulin aspart (NOVOLOG FLEXPEN) 100 UNIT/ML FlexPen Inject4- 6 units before breakfast and lunch and 10-12 units before dinner (Patient taking differently: Inject 6 units before breakfast and lunch and 12 units before dinner) 30 mL 3  . Insulin Glargine (TOUJEO SOLOSTAR) 300 UNIT/ML SOPN Inject 40 Units into the skin daily. (Patient taking differently: Inject 36 Units into the skin daily. ) 18 mL 1  . Insulin Pen Needle 31G X 8 MM MISC Use to inject  medication 4 times daily 400 each 1  . isosorbide mononitrate (IMDUR) 30 MG 24 hr tablet TAKE 1 TABLET (30 MG TOTAL) BY MOUTH DAILY. 90 tablet 3  . JARDIANCE 10 MG TABS tablet TAKE ONE TABLET BY MOUTH ONCE DAILY WITH  BREAKFAST 30 tablet 3  . lamoTRIgine (LAMICTAL) 100 MG tablet Take 100 mg by mouth daily.    Marland Kitchen lidocaine (LIDODERM) 5 % Place 1 patch onto the skin daily. Remove & Discard patch within 12 hours or as directed by MD    . Menthol, Topical Analgesic, 3.5 % GEL Apply 1 application topically as needed. Foot pain    . metolazone (ZAROXOLYN) 2.5 MG tablet Take 1 tablet (2.5 mg total) by mouth once a week. 4 tablet 3  . Multiple Vitamins-Minerals (MULTIVITAMIN WITH MINERALS) tablet Take 1 tablet by mouth daily.     Marland Kitchen olmesartan (BENICAR) 40 MG tablet Take 40 mg by mouth daily.    . Pancrelipase, Lip-Prot-Amyl, 25000 UNITS CPEP Take 5 capsules by mouth 3 (three) times daily. 2 caps at breakfast, 1 at lunch, 2 at dinner    . potassium chloride SA (K-DUR,KLOR-CON) 20 MEQ tablet Take 2 tablets (40 mEq total) by mouth daily. On the day you take metolazone, take an extra 20 meq (60 meq total)    . pregabalin (LYRICA) 100 MG capsule Take 100 mg by mouth 2 (two) times daily.    . sertraline (ZOLOFT) 50 MG tablet Take 50 mg by mouth daily.    Marland Kitchen VICTOZA 18 MG/3ML SOPN INJECT 1.8MG SUBCUTANEOUSLY DAILY (NEEDS APPOINTMENT FOR REFILLS) 27 mL 0   No current facility-administered medications for this visit.     Functional Status:  In your present state of health, do you have any difficulty performing the following activities: 02/15/2017 10/08/2016  Hearing? N N  Vision? Y Y  Difficulty concentrating or making decisions? N N  Walking or climbing stairs? Y Y  Dressing or bathing? N N  Doing errands, shopping? [name]  Preparing Food and eating ? N N  Using the Toilet? N N  In the past six months, have you accidently leaked urine? Y Y  Do you have problems with loss of bowel control? N N  Managing your Medications? - N  Managing your Finances? N N  Housekeeping or managing your Housekeeping? N N  Some recent data might be hidden    Fall/Depression Screening: Fall Risk  02/15/2017 10/08/2016 09/05/2016  Falls in the past year? Yes Yes Yes  Number falls in past yr: 2 or more 2 or more 2 or more  Injury with Fall? No No No  Risk Factor Category  High Fall Risk High Fall Risk High Fall Risk  Risk for fall due to : History of fall(s);Impaired balance/gait;Impaired mobility History of fall(s);Impaired balance/gait;Impaired mobility;Impaired vision History of fall(s);Impaired balance/gait;Impaired mobility;Impaired vision  Follow up Falls evaluation completed;Education provided;Falls prevention discussed Education  provided;Falls prevention discussed;Falls evaluation completed Falls evaluation completed;Education provided;Falls prevention discussed   PHQ 2/9 Scores 02/15/2017 10/08/2016 09/05/2016 07/23/2016 06/22/2016 05/02/2016 03/29/2016  PHQ - 2 Score 0 0 0 0 0 1 1   THN CM Care Plan Problem One     Most Recent Value  Care Plan Problem One  Knowledge Deficit in self management of Diabetes  Role Documenting the Problem One  Harmony for Problem One  Active  THN Long Term Goal   Patient will be able to return to exercise program withn the next 90 days  THN Long Term Goal Met Date  02/15/17  Interventions for Problem One Long Term Goal  RN discussed with patient about contacting physician for ortho conditions. RN discussed talking with ortho about new brace since prior brace worn out. RN will follow up with care   [facility] CM Short Term Goal #1   Patient will verbalize eating healthy snacks and healthy meals within the next 30 days  THN CM Short Term Goal #1 Start Date  10/08/16  Interventions for Short Term Goal #1  RN discussed eating 3 meals a day and 2 healthy snacks. RN sent a list of healthy snacks. RN will send a list of diabetic foods patient can order forom a food chain/RN will follow up with discussion and teach back  THN CM Short Term Goal #2   Patient will report understandig portion control within the next 30 days  THN CM Short Term Goal #2 Start Date  02/15/17  Interventions for Short Term Goal #2  RN sent patient educational material on portion control. RN discussed the importance of using portion control, smaller plates to aid with weight loss. RN will follow up with discussion and teachback  THN CM Short Term Goal #3  Patient will report rotating injection sites within the next 30 days  THN CM Short Term Goal #3 Start Date  10/08/16  [facility] CM Short Term Goal #3 Met Date  02/15/17  Interventions for Short Tern Goal #3  RN sent patient picture chart of rotating sites. RN discussed sites  for patient to rotate to.  RN will follow up with patient     Assessment:  A1C 6.9 Fasting blood sugar 164 Patient needs a CPAP Patient having difficulty with portion control   Plan: RN discussed portion control and ways to help adhere  RN sent educational material on portion control Referred to social worker RN discussed eating healthy snacks between meals RN sent list of healthy snacks RN sent educational material on foods to eat at fast food chain RN will follow up with discussion and teach back  [facility] [phone]

## 2017-02-19 ENCOUNTER — Encounter: Payer: Self-pay | Admitting: *Deleted

## 2017-02-28 ENCOUNTER — Other Ambulatory Visit: Payer: Self-pay | Admitting: *Deleted

## 2017-02-28 ENCOUNTER — Encounter: Payer: Self-pay | Admitting: *Deleted

## 2017-02-28 DIAGNOSIS — E1140 Type 2 diabetes mellitus with diabetic neuropathy, unspecified: Secondary | ICD-10-CM | POA: Diagnosis not present

## 2017-02-28 DIAGNOSIS — Z6841 Body Mass Index (BMI) 40.0 and over, adult: Secondary | ICD-10-CM | POA: Diagnosis not present

## 2017-02-28 DIAGNOSIS — I129 Hypertensive chronic kidney disease with stage 1 through stage 4 chronic kidney disease, or unspecified chronic kidney disease: Secondary | ICD-10-CM | POA: Diagnosis not present

## 2017-02-28 DIAGNOSIS — N182 Chronic kidney disease, stage 2 (mild): Secondary | ICD-10-CM | POA: Diagnosis not present

## 2017-02-28 NOTE — Patient Outreach (Signed)
[facility]) Care Management  02/28/2017  KEHAULANI FRUIN 1946-02-18 [id number]   CSW was able to make initial contact with patient today to perform phone assessment, as well as assess and assist with social work needs and services.  CSW introduced self, explained role and types of services provided through [facility] ([facility]).  CSW further explained to patient that CSW works with patient's [place], also with [facility], Johny Shock. CSW then explained the reason for the call, indicating that Mrs. Pleasant thought that patient would benefit from social work services and resources to assist with obtaining a CPAP Machine and supplies for home use.  CSW obtained two HIPAA compliant identifiers from patient, which included patient's name and date of birth. CSW first spoke with patient about obtaining a CPAP machine, as patient and her sister, [name] were able to make contact with a representative from [facility]) and learned that a new CPAP Machine for patient would cost her $2,000.00, out-of-pocket.  In addition, patient and Mrs. Pressley learned that patient's CPAP Machine supplies would cost anywhere from $100.00-$200.00 per month.  Patient admits that she is unable to afford the supplies each month, let alone the machine, as she is currently on a very fixed income and has no additional money left over each month after paying bills.  CSW agreed to work with individuals, as well as agencies to see about getting a gently used CPAP Machine donated to patient.  In addition, CSW has placed a call to a representative with [facility] to see about any financial assistance that may be available to help pay for patient's monthly supplies. Mrs. Rockwell Germany also agreed to contact a list of agencies provided to her by CSW, to see if they offer financial assistance for CPAP Machine supplies. Patient currently receives Partial  Medicaid coverage to help pay for [facility] premiums, but does not qualify for full Adult Medicaid coverage.  CSW will contact [employer] to see if a percentage of patient's CPAP Machine supplies can be covered each month.  CSW agreed to follow-up with patient as soon as CSW is able to report findings.  Patient and Mrs. Pressley also agreed to follow-up with CSW as soon as they are able to receive answers from agencies contacted.  CSW will follow-up with patient and Mrs. Pressley within the next week, regardless. Nat Christen, BSW, MSW, LCSW  Licensed Education officer, environmental Health System  Mailing [place] [address] [place], [place] [address] Physical Address-[address], [place], [place] [address] Toll Free Main # [phone] Fax # [phone] Cell # [phone]  Office # [phone] [email or web address]

## 2017-03-04 ENCOUNTER — Encounter (HOSPITAL_COMMUNITY): Payer: Self-pay | Admitting: *Deleted

## 2017-03-04 ENCOUNTER — Emergency Department (HOSPITAL_COMMUNITY)
Admission: EM | Admit: 2017-03-04 | Discharge: 2017-03-05 | Disposition: A | Discharge status: Home / Self Care | Payer: Medicare HMO | Attending: Emergency Medicine | Admitting: Emergency Medicine

## 2017-03-04 ENCOUNTER — Other Ambulatory Visit: Payer: Self-pay | Admitting: *Deleted

## 2017-03-04 ENCOUNTER — Emergency Department (HOSPITAL_COMMUNITY): Payer: Medicare HMO

## 2017-03-04 DIAGNOSIS — E785 Hyperlipidemia, unspecified: Secondary | ICD-10-CM | POA: Insufficient documentation

## 2017-03-04 DIAGNOSIS — R072 Precordial pain: Secondary | ICD-10-CM | POA: Diagnosis not present

## 2017-03-04 DIAGNOSIS — Z87891 Personal history of nicotine dependence: Secondary | ICD-10-CM | POA: Insufficient documentation

## 2017-03-04 DIAGNOSIS — E119 Type 2 diabetes mellitus without complications: Secondary | ICD-10-CM | POA: Insufficient documentation

## 2017-03-04 DIAGNOSIS — I5032 Chronic diastolic (congestive) heart failure: Secondary | ICD-10-CM | POA: Diagnosis not present

## 2017-03-04 DIAGNOSIS — R0789 Other chest pain: Secondary | ICD-10-CM | POA: Diagnosis not present

## 2017-03-04 DIAGNOSIS — Z794 Long term (current) use of insulin: Secondary | ICD-10-CM | POA: Insufficient documentation

## 2017-03-04 DIAGNOSIS — G20 Parkinson's disease: Secondary | ICD-10-CM | POA: Insufficient documentation

## 2017-03-04 DIAGNOSIS — R079 Chest pain, unspecified: Secondary | ICD-10-CM | POA: Diagnosis not present

## 2017-03-04 DIAGNOSIS — R0602 Shortness of breath: Secondary | ICD-10-CM | POA: Diagnosis present

## 2017-03-04 DIAGNOSIS — I110 Hypertensive heart disease with heart failure: Secondary | ICD-10-CM | POA: Diagnosis not present

## 2017-03-04 DIAGNOSIS — E860 Dehydration: Secondary | ICD-10-CM

## 2017-03-04 DIAGNOSIS — Z79899 Other long term (current) drug therapy: Secondary | ICD-10-CM | POA: Diagnosis not present

## 2017-03-04 HISTORY — DX: Heart failure, unspecified: I50.9

## 2017-03-04 LAB — I-STAT TROPONIN, ED: Troponin i, poc: 0 ng/mL (ref 0.00–0.08)

## 2017-03-04 LAB — BASIC METABOLIC PANEL
Anion gap: 9 (ref 5–15)
BUN: 25 mg/dL — ABNORMAL HIGH (ref 6–20)
CO2: 29 mmol/L (ref 22–32)
Calcium: 9 mg/dL (ref 8.9–10.3)
Chloride: 94 mmol/L — ABNORMAL LOW (ref 101–111)
Creatinine, Ser: 1.5 mg/dL — ABNORMAL HIGH (ref 0.44–1.00)
GFR calc Af Amer: 40 mL/min — ABNORMAL LOW (ref >60)
GFR calc non Af Amer: 35 mL/min — ABNORMAL LOW (ref >60)
Glucose, Bld: 122 mg/dL — ABNORMAL HIGH (ref 65–99)
Potassium: 3.4 mmol/L — ABNORMAL LOW (ref 3.5–5.1)
Sodium: 132 mmol/L — ABNORMAL LOW (ref 135–145)

## 2017-03-04 LAB — URINALYSIS, ROUTINE W REFLEX MICROSCOPIC
Bacteria, UA: NONE SEEN
Bilirubin Urine: NEGATIVE
Glucose, UA: >=500 mg/dL — AB
Hgb urine dipstick: NEGATIVE
Ketones, ur: NEGATIVE mg/dL
Leukocytes, UA: NEGATIVE
Nitrite: NEGATIVE
Protein, ur: NEGATIVE mg/dL
Specific Gravity, Urine: 1.001 — ABNORMAL LOW (ref 1.005–1.030)
WBC, UA: NONE SEEN WBC/hpf (ref 0–5)
pH: 5 (ref 5.0–8.0)

## 2017-03-04 LAB — CBC
HCT: 35.3 % — ABNORMAL LOW (ref 36.0–46.0)
Hemoglobin: 11 g/dL — ABNORMAL LOW (ref 12.0–15.0)
MCH: 22.9 pg — ABNORMAL LOW (ref 26.0–34.0)
MCHC: 31.2 g/dL (ref 30.0–36.0)
MCV: 73.4 fL — ABNORMAL LOW (ref 78.0–100.0)
Platelets: 194 10*3/uL (ref 150–400)
RBC: 4.81 MIL/uL (ref 3.87–5.11)
RDW: 16.7 % — ABNORMAL HIGH (ref 11.5–15.5)
WBC: 12.6 10*3/uL — ABNORMAL HIGH (ref 4.0–10.5)

## 2017-03-04 MED ORDER — SODIUM CHLORIDE 0.9 % IV BOLUS (SEPSIS)
500.0000 mL | Freq: Once | INTRAVENOUS | Status: AC
  Administered 2017-03-04: 500 mL via INTRAVENOUS

## 2017-03-04 NOTE — Patient Outreach (Signed)
[facility]) Care Management  03/04/2017  [name] February 23, 1946 [id number]   CSW noted that patient presented to the Emergency Department at [facility] today with complaints of dizziness, shortness of breath and chest pressure.  CSW will continue to follow patient while hospitalized, then resume social work services in the community once patient is discharged. Nat Christen, BSW, MSW, LCSW  Licensed Education officer, environmental Health System  [place] [address], [place], [place] [address] Physical Address[address], [place], [place] [address] Toll Free Main # [phone] Fax # [phone] Cell # [phone]  Office # [phone] [email or web address]

## 2017-03-04 NOTE — ED Notes (Signed)
Call step son for DC. Acey Lav: [phone]

## 2017-03-04 NOTE — ED Notes (Addendum)
Pt placed back on 1L of oxygen due to  SPO2 being between 88 and 92

## 2017-03-04 NOTE — ED Triage Notes (Signed)
Pt c/o sob, near-syncope and chest pressures since 1300 today.  91% on RA when fire arrived and increased to 100 on nrb and stated dizziness improved when oxygenation improved . 12 Lead showed pac's.  SBP has been soft in low 100's.  Given 324 asa with no nitro d/t hypotension.  Pt has been tx for sob and has been told it was d/t  Weight gain.  Hx of home o2, but has not used in several years.

## 2017-03-04 NOTE — ED Provider Notes (Signed)
[facility] DEPT Provider Note   CSN: [id number] Arrival date & time: 03/04/17  1431     History   Chief Complaint Chief Complaint  Patient presents with  . Shortness of Breath  . Dizziness    HPI LYRICK WORLAND is a 71 y.o. female.  DAWANDA MAPEL is a 71 y.o. Female who presents to the ED complaining of chest pressure, shortness of breath and feeling lightheaded and dizzy with position change today. She reports she first began feeling more lightheaded yesterday. It is worse with position change. Not with movement of her head. She also reports feeling more short of breath than usual and having some chest pressure. She reports she urinates very frequently and this has increased recently. She tells me she takes 80 mg of lasix daily. She denies any dysuria or hematuria. She denies feeling short of breath currently. She denies feeling lightheaded or dizzy currently unless she sits up or stands up. Family reported that her blood pressure was on the lower end of normal for her today. She reports her blood pressures usually around 505 systolic and earlier today it was around 90 systolic. Patient denies fevers, increased coughing, palpitations, leg swelling, abdominal pain, nausea, vomiting, diarrhea, dysuria, hematuria or syncope.   The history is provided by the patient, a relative and medical records. No language interpreter was used.  Shortness of Breath  Pertinent negatives include no fever, no headaches, no sore throat, no neck pain, no cough, no wheezing, no chest pain, no vomiting, no abdominal pain and no rash.  Dizziness  Associated symptoms: shortness of breath   Associated symptoms: no chest pain, no diarrhea, no headaches, no nausea, no palpitations, no vomiting and no weakness     Past Medical History:  Diagnosis Date  . Abnormal liver function     in the past.  . Anemia   . Arthritis    all over  . Bruises easily   . Cataract    right eye;immature  . CHF  (congestive heart failure) ([place])   . Chronic back pain    stenosis  . Chronic cough   . Demand myocardial infarction [facility] 2012   Demand Infarction in setting of Pancreatitis --> mild Troponin elevation, Non-obstructive CAD  . Depression    takes Abilify daily as well as Zoloft  . Diastolic heart failure 3976   Grade 1 diastolic Dysfunction by Echo   . Diverticulosis   . DM (diabetes mellitus) ([place])    takes Victoza daily as well as Lantus and Humalog  . Empty sella ([place])    on MRI in 2009.  . Glaucoma   . Headache(784.0)    last migraine-4-37yr ago  . History of blood transfusion    no abnormal reaction noted  . History of colon polyps    benign  . History of influenza    antibiotic given yesterday along with taking Delsym cough syrup  . HTN (hypertension)    takes BKinder Morgan Energydaily  . Hyperlipidemia    takes Lipitor daily  . Joint swelling   . Nocturia   . Obstructive sleep apnea   . Pancreatitis    takes Pancrelipase daily  . Parkinson's disease ([place]    takes Sinemet daily  . Peripheral edema    takes Lasix daily  . Peripheral neuropathy   . Pneumonia 2012  . Seizure disorder ([place]   . Seizures ([place]    takes Lamictal daily and Primidone nightly;last seizure 2wks ago  . Urinary frequency   .  Urinary urgency   . Varicose veins of both lower extremities with pain     Patient Active Problem List   Diagnosis Date Noted  . Medication management 12/24/2016  . Pre-syncope 03/09/2016  . Racing heart beat 03/09/2016  . DOE (dyspnea on exertion) 01/28/2016  . Spinal stenosis at L4-L5 level 10/04/2014  . Lower abdominal pain 10/31/2012  . Type II diabetes mellitus, uncontrolled ([place]) 10/31/2012  . Seizure disorder ([place]) 10/31/2012  . Diverticulosis 10/31/2012  . Anemia associated with acute blood loss 08/14/2012  . Hematochezia 08/14/2012  . Varicose veins of bilateral lower extremities with other complications 76/28/3151  . Leg pain 03/21/2012  .  Radicular pain of left lower extremity 03/21/2012  . Infected pseudocyst of pancreas 10/28/2011  . Pancreatitis chronic 10/28/2011  . Hypertensive heart disease 10/28/2011  . Chronic respiratory failure ([place]) 10/28/2011  . Dyslipidemia, goal LDL below 100 10/28/2011  . Morbid obesity with BMI of 50.0-59.9, adult ([place]) 10/28/2011  . Peripheral edema 04/14/2011  . Obesity hypoventilation syndrome ([place]) 10/26/2010  . Chronic diastolic congestive heart failure ([place]) 10/24/2010  . Coronary artery disease, non-occlusive 08/28/2010    Past Surgical History:  Procedure Laterality Date  . ABDOMINAL HYSTERECTOMY    . APPENDECTOMY    . BACK SURGERY    . CARDIAC CATHETERIZATION  2009/March 2012   2009: (Dr. Alvie Heidelberg) Nonobstructive CAD; 2012: Minimal CAD --> false positive stress test  . Cardiac Event Monitor  September-October 2017   Sinus rhythm with occasional PACs and artifact. No arrhythmias besides one short run of tachycardia.  . CHOLECYSTECTOMY    . COLONOSCOPY N/A 08/15/2012   Procedure: COLONOSCOPY;  Surgeon: Arta Silence, MD;  Location: [facility] ENDOSCOPY;  Service: Endoscopy;  Laterality: N/A;  . ESOPHAGOGASTRODUODENOSCOPY    . eye cysts Bilateral   . LASIK    . NM MYOVIEW LTD  March 2012; Aug 2017   Probably normal Lexiscan Myoview. Mild reversible Anterior Defect ~ Breast Attenuation. (CRO ischemia):  FALSE POSITIVE by Cath; b) 8/'17: LOW RISK, No ischemia or Infarction.  Breast Attenuation not noted.  Marland Kitchen NM MYOVIEW LTD  01/2016   LOW RISK. No ischemia or infarction.  Marland Kitchen RECTAL POLYPECTOMY    . TONSILLECTOMY    . TRANSTHORACIC ECHOCARDIOGRAM  01/2016   Normal LV chamber size with moderate LVH pattern. EF 50-55%. Severe LA dilation. Moderate RA dilation. PA pressure elevated at 38 mmHg (mild as (  . TUBAL LIGATION    . vaginal cyst removed     several times    OB History    No data available       Home Medications    Prior to Admission medications   Medication Sig Start  Date End Date Taking? Authorizing Provider  acetaminophen (TYLENOL) 500 MG tablet Take 500-1,000 mg by mouth every 4 (four) hours as needed (for pain).    Yes [provider]  ARIPiprazole (ABILIFY) 10 MG tablet Take 10 mg by mouth daily.   Yes [provider]  aspirin EC 81 MG tablet Take 1 tablet (81 mg total) by mouth at bedtime. 11/12/12  Yes Hosie Poisson, MD  atorvastatin (LIPITOR) 10 MG tablet Take 10 mg by mouth daily.   Yes [provider]  B Complex-C (B-COMPLEX WITH VITAMIN C) tablet Take 1 tablet by mouth daily.   Yes [provider]  carbidopa-levodopa (SINEMET IR) 25-100 MG per tablet Take 1 tablet by mouth 3 (three) times daily.  02/11/13  Yes [provider]  carvedilol (COREG)  25 MG tablet Take 1 tablet (25 mg total) by mouth 2 (two) times daily. 06/11/16 09/13/21 Yes Leonie Man, MD  Cholecalciferol (VITAMIN D-3) 1000 units CAPS Take 1,000 Units by mouth daily.   Yes [provider]  cholestyramine Lucrezia Starch) 4 G packet Take 4 g by mouth 2 (two) times daily as needed (for diarrhea).    Yes [provider]  hydrALAZINE (APRESOLINE) 25 MG tablet TAKE 1 TABLET BY MOUTH TWICE DAILY Patient taking differently: Take 25 mg by mouth two times a day 01/28/17  Yes Leonie Man, MD  insulin aspart (NOVOLOG FLEXPEN) 100 UNIT/ML FlexPen Inject4- 6 units before breakfast and lunch and 10-12 units before dinner Patient taking differently: Inject 6-10 Units into the skin See admin instructions. 6 units before breakfast then 6 units before lunch then 10 units before dinner (evening meal) 04/17/16  Yes Elayne Snare, MD  Insulin Glargine (TOUJEO SOLOSTAR) 300 UNIT/ML SOPN Inject 40 Units into the skin daily. Patient taking differently: Inject 36 Units into the skin at bedtime.  04/17/16  Yes Elayne Snare, MD  isosorbide mononitrate (IMDUR) 30 MG 24 hr tablet TAKE 1 TABLET (30 MG TOTAL) BY MOUTH DAILY. 07/16/16  Yes Leonie Man, MD    JARDIANCE 10 MG TABS tablet TAKE ONE TABLET BY MOUTH ONCE DAILY WITH  BREAKFAST Patient taking differently: Take 10 mg by mouth once a day with breakfast 08/14/16  Yes Elayne Snare, MD  lamoTRIgine (LAMICTAL) 100 MG tablet Take 100 mg by mouth daily.   Yes [provider]  Menthol, Topical Analgesic, 3.5 % GEL Apply 1 application topically as needed (for neuropathy in feet).    Yes [provider]  metolazone (ZAROXOLYN) 2.5 MG tablet Take 1 tablet (2.5 mg total) by mouth once a week. Patient taking differently: Take 2.5 mg by mouth every Saturday.  01/29/17 04/29/17 Yes Barrett, Evelene Croon, PA-C  Multiple Vitamins-Minerals (MULTIVITAMIN WITH MINERALS) tablet Take 1 tablet by mouth daily.   Yes [provider]  naproxen (NAPROSYN) 500 MG tablet Take 500 mg by mouth 2 (two) times daily as needed for pain. 02/15/17  Yes [provider]  olmesartan (BENICAR) 40 MG tablet Take 40 mg by mouth daily.   Yes [provider]  Pancrelipase, Lip-Prot-Amyl, 25000 UNITS CPEP Take 25,000-50,000 Units by mouth See admin instructions. 50,000 units with breakfast then 25,000 units with lunch then 50,000 units with dinner (evening meal)   Yes [provider]  potassium chloride SA (K-DUR,KLOR-CON) 20 MEQ tablet Take 2 tablets (40 mEq total) by mouth daily. On the day you take metolazone, take an extra 20 meq (60 meq total) Patient taking differently: Take 40 mEq by mouth daily.  02/01/17  Yes Almyra Deforest, PA  pregabalin (LYRICA) 100 MG capsule Take 100 mg by mouth 2 (two) times daily.   Yes [provider]  sertraline (ZOLOFT) 50 MG tablet Take 50 mg by mouth every evening.    Yes [provider]  VICTOZA 18 MG/3ML SOPN INJECT 1.8MG SUBCUTANEOUSLY DAILY (NEEDS APPOINTMENT FOR REFILLS) Patient taking differently: Inject 1.8 mg into the skin daily at lunchtime 10/05/16  Yes Elayne Snare, MD  ACCU-CHEK FASTCLIX LANCETS MISC USE  TO CHECK BLOOD SUGAR THREE TIMES  DAILY 06/24/15   Elayne Snare, MD  ACCU-CHEK SMARTVIEW test strip CHECK BLOOD SUGAR THREE TIMES DAILY 04/17/16   Elayne Snare, MD  Alcohol Swabs (B-D SINGLE USE SWABS REGULAR) PADS Use as directed 12/22/15   Elayne Snare, MD  Blood  Glucose Monitoring Suppl (ACCU-CHEK NANO SMARTVIEW) W/DEVICE KIT Use to check blood sugar 3 times per day dx code E11.65 07/19/14   Elayne Snare, MD  furosemide (LASIX) 40 MG tablet Take 1 tablet (40 mg total) by mouth daily. 03/05/17   Waynetta Pean, PA-C  Insulin Pen Needle 31G X 8 MM MISC Use to inject medication 4 times daily 12/22/15   Elayne Snare, MD    Family History Family History  Problem Relation Age of Onset  . Allergies Father   . Heart disease Father        before 36  . Heart failure Father   . Hypertension Father   . Hyperlipidemia Father   . Heart disease Mother   . Diabetes Mother   . Hypertension Mother   . Hyperlipidemia Mother   . Hypertension Sister   . Heart disease Sister        before 49  . Hyperlipidemia Sister   . Diabetes Brother   . Hypertension Brother   . Diabetes Sister   . Hypertension Sister   . Diabetes Son   . Hypertension Son   . Cancer Other     Social History Social History  Substance Use Topics  . Smoking status: Former Smoker    Packs/day: 0.50    Years: 25.00    Types: Cigarettes  . Smokeless tobacco: Never Used     Comment: quit smoking 20+ytrs ago  . Alcohol use No     Allergies   Penicillins; Sulfa antibiotics; Codeine; and Other   Review of Systems Review of Systems  Constitutional: Negative for chills and fever.  HENT: Negative for congestion and sore throat.   Eyes: Negative for visual disturbance.  Respiratory: Positive for chest tightness and shortness of breath. Negative for cough and wheezing.   Cardiovascular: Negative for chest pain and palpitations.  Gastrointestinal: Negative for abdominal pain, diarrhea, nausea and vomiting.  Genitourinary: Positive for frequency. Negative for  decreased urine volume, difficulty urinating, dysuria and hematuria.  Musculoskeletal: Negative for back pain and neck pain.  Skin: Negative for rash.  Neurological: Positive for dizziness and light-headedness. Negative for syncope, weakness, numbness and headaches.     Physical Exam Updated Vital Signs BP 120/78   Pulse 60   Temp 97.8 F (36.6 C) (Oral)   Resp 12   Ht 5' (1.524 m)   Wt 123.4 kg (272 lb)   SpO2 96%   BMI 53.12 kg/m   Physical Exam  Constitutional: She is oriented to person, place, and time. She appears well-developed and well-nourished. No distress.  Nontoxic appearing.  HENT:  Head: Normocephalic and atraumatic.  Mouth/Throat: Oropharynx is clear and moist.  Eyes: Pupils are equal, round, and reactive to light. Conjunctivae and EOM are normal. Right eye exhibits no discharge. Left eye exhibits no discharge.  Neck: Neck supple. No JVD present.  Cardiovascular: Normal rate, regular rhythm, normal heart sounds and intact distal pulses.  Exam reveals no gallop and no friction rub.   No murmur heard. Pulmonary/Chest: Effort normal and breath sounds normal. No stridor. No respiratory distress. She has no wheezes. She has no rales.  Lung sounds slightly diminished at bilateral bases, otherwise clear to auscultation. No rales or rhonchi. No wheezing. No increased work of breathing.  Abdominal: Soft. There is no tenderness. There is no guarding.  Musculoskeletal: Normal range of motion. She exhibits no edema or tenderness.  No lower extremity edema or tenderness.  Lymphadenopathy:    She has no cervical adenopathy.  Neurological: She is alert and oriented to person, place, and time. No cranial nerve deficit or sensory deficit. She exhibits normal muscle tone. Coordination normal.  Patient is alert and oriented 3. Speech is clear and coherent. Cranial nerves are intact. Sensation and strength is intact to bilateral upper and lower extremities. EOMs are intact. Vision is  grossly intact. No nystagmus. No pronator drift. Finger to nose intact bilaterally.  Skin: Skin is warm and dry. Capillary refill takes less than 2 seconds. No rash noted. She is not diaphoretic. No erythema. No pallor.  Psychiatric: She has a normal mood and affect. Her behavior is normal.  Nursing note and vitals reviewed.    ED Treatments / Results  Labs (all labs ordered are listed, but only abnormal results are displayed) Labs Reviewed  BASIC METABOLIC PANEL - Abnormal; Notable for the following:       Result Value   Sodium 132 (*)    Potassium 3.4 (*)    Chloride 94 (*)    Glucose, Bld 122 (*)    BUN 25 (*)    Creatinine, Ser 1.50 (*)    GFR calc non Af Amer 35 (*)    GFR calc Af Amer 40 (*)    All other components within normal limits  CBC - Abnormal; Notable for the following:    WBC 12.6 (*)    Hemoglobin 11.0 (*)    HCT 35.3 (*)    MCV 73.4 (*)    MCH 22.9 (*)    RDW 16.7 (*)    All other components within normal limits  URINALYSIS, ROUTINE W REFLEX MICROSCOPIC - Abnormal; Notable for the following:    APPearance HAZY (*)    Specific Gravity, Urine 1.001 (*)    Glucose, UA >=500 (*)    Squamous Epithelial / LPF 0-5 (*)    All other components within normal limits  I-STAT TROPONIN, ED    EKG  EKG Interpretation None       Radiology Dg Chest 2 View  Result Date: 03/04/2017 CLINICAL DATA:  Chest "pressure" and SOB with dizziness beginning today around 1 pm EXAM: CHEST  2 VIEW COMPARISON:  09/02/2014 FINDINGS: Heart size is accentuated by the AP semi recumbent position of patient. No focal consolidations or pleural effusions. No pulmonary edema. Study quality is degraded by patient body habitus. IMPRESSION: No evidence for acute  abnormality. Electronically Signed   By: Nolon Nations M.D.   On: 03/04/2017 17:20    Procedures Procedures (including critical care time)  Medications Ordered in ED Medications  sodium chloride 0.9 % bolus 500 mL (0 mLs  Intravenous Stopped 03/04/17 2210)  sodium chloride 0.9 % bolus 500 mL (0 mLs Intravenous Stopped 03/05/17 0113)     Initial Impression / Assessment and Plan / ED Course  I have reviewed the triage vital signs and the nursing notes.  Pertinent labs & imaging results that were available during my care of the patient were reviewed by me and considered in my medical decision making (see chart for details).    This is a 71 y.o. Female who presents to the ED complaining of chest pressure, shortness of breath and feeling lightheaded and dizzy with position change today. She reports she first began feeling more lightheaded yesterday. It is worse with position change. Not with movement of her head. She also reports feeling more short of breath than usual and having some chest pressure. She reports she urinates very frequently and this has increased recently. She  tells me she takes 80 mg of lasix daily. She denies any dysuria or hematuria. She denies feeling short of breath currently. She denies feeling lightheaded or dizzy currently unless she sits up or stands up.. Family reported that her blood pressure was on the lower end of normal for her today. She reports her blood pressures usually around 253 systolic and earlier today it was around 90 systolic. On exam the patient is afebrile nontoxic appearing. She is slightly diminished lung sounds are bilateral bases and they are otherwise clear. No rales or rhonchi. There is no lower extremity edema. She has a very full urinal at the bedside. She tells me she is urinating very frequently. Urinalysis without signs of infection and specific gravity is 1.001. Troponin is not elevated. BMP is Elta [place] will for elevated creatinine with the plan of 1.50. This is an increase from baseline of 1.0. Chest x-ray is unremarkable. Patient feels lightheaded when I sit her up in bed. I suspect she is dehydrated from being over diuresed Lasix. She has a slight acute kidney injury. At  reevaluation she is continuing to urinate a very large amount. She tells me she's been taking 80 mg of Lasix daily. I see that this is not actually how this was most recently prescribed her chart. Will provide her with oral and IV hydration and reevaluate. After patient received to half liter fluid boluses and oral hydration patient is able to sit up and stand and walk in the room without feeling lightheaded or dizzy. She tells me she is feeling much better and feels ready to go home. We'll discharge at this time. I advised she needs to hold her Lasix for the next 2 days and then restarted again at 40 mg a day. I encouraged her to call her PCP tomorrow to get an appointment for follow-up and have her kidney function rechecked. She agrees with plan. Family also agrees with plan. Return precautions discussed. I advised the patient to follow-up with their primary care provider this week. I advised the patient to return to the emergency department with new or worsening symptoms or new concerns. The patient verbalized understanding and agreement with plan.    This patient was discussed with and evaluated by Dr. Eulis Foster who agrees with assessment and plan.   Final Clinical Impressions(s) / ED Diagnoses   Final diagnoses:  Dehydration  Precordial pain    New Prescriptions New Prescriptions   FUROSEMIDE (LASIX) 40 MG TABLET    Take 1 tablet (40 mg total) by mouth daily.     Waynetta Pean, PA-C 03/05/17 0127    Daleen Bo, MD 03/05/17 [phone]

## 2017-03-04 NOTE — ED Notes (Signed)
Pt taken of oxygen

## 2017-03-04 NOTE — ED Provider Notes (Signed)
  Face-to-face evaluation   History: She presents for lightheaded feeling, and chest pressure.  She is on Lasix 80 mg a day.  No recent changes in that dosing.  She takes Lasix for leg swelling.  Physical exam: Alert, cooperative.  Lower legs without edema.  They are nontender to palpation.  Medical screening examination/treatment/procedure(s) were conducted as a shared visit with non-physician practitioner(s) and myself.  I personally evaluated the patient during the encounter  BUN  Date Value Ref Range Status  03/04/2017 25 (H) 6 - 20 mg/dL Final  01/29/2017 24 8 - 27 mg/dL Final  12/07/2016 17 8 - 27 mg/dL Final  11/20/2016 19 8 - 27 mg/dL Final  11/12/2016 17 8 - 27 mg/dL Final   Creat  Date Value Ref Range Status  10/04/2016 0.91 0.50 - 0.99 mg/dL Final    Comment:      For patients > or = 69 years of age: The upper reference limit for Creatinine is approximately 13% higher for people identified as African-American.      Creatinine, Ser  Date Value Ref Range Status  03/04/2017 1.50 (H) 0.44 - 1.00 mg/dL Final  01/29/2017 0.95 0.57 - 1.00 mg/dL Final  12/07/2016 1.04 (H) 0.57 - 1.00 mg/dL Final  11/20/2016 1.04 (H) 0.57 - 1.00 mg/dL Final      Daleen Bo, MD 03/05/17 1631

## 2017-03-05 ENCOUNTER — Telehealth: Payer: Self-pay | Admitting: Cardiology

## 2017-03-05 DIAGNOSIS — R0609 Other forms of dyspnea: Secondary | ICD-10-CM

## 2017-03-05 DIAGNOSIS — E860 Dehydration: Secondary | ICD-10-CM

## 2017-03-05 DIAGNOSIS — Z79899 Other long term (current) drug therapy: Secondary | ICD-10-CM

## 2017-03-05 MED ORDER — FUROSEMIDE 40 MG PO TABS: 40.0000 mg | ORAL_TABLET | Freq: Every day | ORAL | 0 refills | Status: DC

## 2017-03-05 NOTE — ED Notes (Signed)
Pt stood at bedside without feeling dizzy or short of breath.

## 2017-03-05 NOTE — Discharge Instructions (Signed)
Please do not take your lasix for two days. Then start back taking 40 mg daily. Please follow up this week with your primary care provider.

## 2017-03-05 NOTE — ED Notes (Signed)
Pt verbalized understanding of d/c instructions and has no further questions. Pt is stable, A&Ox4, VSS.  

## 2017-03-05 NOTE — Telephone Encounter (Signed)
Pt of Dr. Marlowe Alt to her. She was seen in ED yesterday for dehydration and shortness of breath. She was assessed and sent home.  Pt was previously taking lasix 80mg  daily. She was given instruction  to hold her lasix x2 days and then resume at a dosage of 40mg  a day.  I advised her to follow ED physician's recommendations, continue to monitor for any dyspnea or LE swelling, call us if she has any concerns.   She has a 6 mo f/u w Dr. Ellyn Hack on 9/20. Advised to keep this appt. Will check w Dr. Ellyn Hack to see if anything further advised in interim.

## 2017-03-05 NOTE — Telephone Encounter (Signed)
°  New Message   pt verbalized that she is calling for rn   Pt was seen in ED on 03/04/2017 for dehydration and sob  She wants rn to call so she cn follow up with care  Pt c/o medication issue:  1. Name of Medication: lasix 80mg  2. How are you currently taking this medication (dosage and times per day)? 1xday 3. Are you having a reaction (difficulty breathing--STAT)? no  4. What is your medication issue? ER told her to go to 40mg  a day

## 2017-03-06 NOTE — Telephone Encounter (Signed)
Sounds reasonable.  I can see her in follow-up. We should check a chemistry panel before I see her back.  Glenetta Hew, MD

## 2017-03-07 DIAGNOSIS — E860 Dehydration: Secondary | ICD-10-CM | POA: Insufficient documentation

## 2017-03-07 NOTE — Telephone Encounter (Signed)
Spoke to patient. INFORMATION given . Verbalized understanding PATIENT AWARE TO COME TO OFFICE FOR BMP TO BE DRAWN BEFORE 03/13/17 NON-FASTING

## 2017-03-08 ENCOUNTER — Other Ambulatory Visit: Payer: Self-pay | Admitting: *Deleted

## 2017-03-08 NOTE — Patient Outreach (Signed)
[facility]) Care Management  03/08/2017  [name] February 14, 1946 [id number]  CSW made an attempt to try and contact patient today to follow-up regarding social work services and resources, as well as to ensure that patient received the packet of resource information mailed to her home by CSW; however, patient was unavailable at the time of CSW's call.  A HIPAA compliant message was left for patient on voicemail.  CSW is currently awaiting a return call.  CSW will make a second outreach attempt within the next week, if CSW does not receive a return call from patient in the meantime. Nat Christen, BSW, MSW, LCSW  Licensed Education officer, [facility]  Mailing [place] [address] [place], [place] [address] Physical Address-[address], [place], [place] [address] Toll Free Main # [phone] Fax # [phone] Cell # [phone]  Office # [phone] [email or web address]

## 2017-03-12 ENCOUNTER — Other Ambulatory Visit: Payer: Self-pay | Admitting: *Deleted

## 2017-03-12 NOTE — Patient Outreach (Signed)
[facility]) Care Management  03/12/2017  [name] 10-09-1945 [id number]   CSW made a second attempt to try and contact patient today to follow-up regarding social work services and resources; however, patient was unavailable at the time of CSW's call.  A HIPAA compliant message was left for patient on voicemail.  CSW is currently awaiting a return call.  CSW will make a third and final outreach attempt within the next week, if CSW does not receive a return call from patient in the meantime. Nat Christen, BSW, MSW, LCSW  Licensed Education officer, environmental [facility]  Mailing [place] [address] [place], [place] [address] Physical Address-[address], [place], [place] [address] Toll Free Main # [phone] Fax # [phone] Cell # [phone]  Office # [phone] [email or web address]

## 2017-03-14 ENCOUNTER — Ambulatory Visit: Payer: Medicare HMO | Admitting: Cardiology

## 2017-03-14 ENCOUNTER — Ambulatory Visit: Payer: Self-pay | Admitting: *Deleted

## 2017-03-15 ENCOUNTER — Ambulatory Visit: Payer: Medicare HMO | Admitting: *Deleted

## 2017-03-15 ENCOUNTER — Ambulatory Visit: Payer: Self-pay | Admitting: *Deleted

## 2017-03-19 ENCOUNTER — Other Ambulatory Visit: Payer: Self-pay | Admitting: *Deleted

## 2017-03-19 NOTE — Patient Outreach (Signed)
[facility]) Care Management  03/19/2017  [name] 12-30-1945 [id number]  RN Health Coach attempted  #1 follow up outreach call to patient.  Patient was unavailable. HIPPA compliance voicemail message left with return callback number.  Plan: RN will call patient again within 14 days.  [facility] Management [phone]

## 2017-03-20 ENCOUNTER — Other Ambulatory Visit: Payer: Self-pay | Admitting: *Deleted

## 2017-03-20 ENCOUNTER — Encounter: Payer: Self-pay | Admitting: *Deleted

## 2017-03-20 NOTE — Patient Outreach (Signed)
[facility]) Care Management  03/20/2017  [name] August 17, 1945 [id number]   CSW made a third and final attempt to try and contact patient today to perform phone assessment, as well as assess and assist with social work needs and services, without success.  A HIPAA compliant message was left for patient on voicemail.  CSW  continues to await a return call.  CSW will mail an outreach letter to patient's home, encouraging patient to contact CSW at their earliest convenience, if patient is interested in receiving social work services through [place] with [facility].  If CSW does not receive a return call from patient within the next 10 business days, CSW will proceed with case closure.  Required number of phone attempts will have been made and outreach letter mailed.   Nat Christen, BSW, MSW, LCSW  Licensed Education officer, environmental Health System  Mailing [place] [address] [place], [place] [address] Physical Address-[address], [place],  [address] Toll Free Main # [phone] Fax # [phone] Cell # [phone]  Office # [phone] [email or web address]

## 2017-03-27 DIAGNOSIS — Z6841 Body Mass Index (BMI) 40.0 and over, adult: Secondary | ICD-10-CM | POA: Diagnosis not present

## 2017-03-27 DIAGNOSIS — E118 Type 2 diabetes mellitus with unspecified complications: Secondary | ICD-10-CM | POA: Diagnosis not present

## 2017-04-01 ENCOUNTER — Ambulatory Visit (INDEPENDENT_AMBULATORY_CARE_PROVIDER_SITE_OTHER): Payer: Medicare HMO | Admitting: Cardiology

## 2017-04-01 ENCOUNTER — Encounter: Payer: Self-pay | Admitting: Cardiology

## 2017-04-01 ENCOUNTER — Ambulatory Visit: Payer: Self-pay | Admitting: *Deleted

## 2017-04-01 VITALS — BP 125/70 | HR 65 | Ht 61.0 in | Wt 281.0 lb

## 2017-04-01 DIAGNOSIS — I83893 Varicose veins of bilateral lower extremities with other complications: Secondary | ICD-10-CM | POA: Diagnosis not present

## 2017-04-01 DIAGNOSIS — I110 Hypertensive heart disease with heart failure: Secondary | ICD-10-CM | POA: Diagnosis not present

## 2017-04-01 DIAGNOSIS — R609 Edema, unspecified: Secondary | ICD-10-CM | POA: Diagnosis not present

## 2017-04-01 DIAGNOSIS — Z6841 Body Mass Index (BMI) 40.0 and over, adult: Secondary | ICD-10-CM

## 2017-04-01 DIAGNOSIS — I5032 Chronic diastolic (congestive) heart failure: Secondary | ICD-10-CM | POA: Diagnosis not present

## 2017-04-01 DIAGNOSIS — I2510 Atherosclerotic heart disease of native coronary artery without angina pectoris: Secondary | ICD-10-CM | POA: Diagnosis not present

## 2017-04-01 NOTE — Progress Notes (Addendum)
PCP: [name], MD  Clinic Note: Chief Complaint  Patient presents with  . Follow-up    edema, HFPEF  . Hospitalization Follow-up    HPI: [name] is a 71 y.o. female with a PMH below who presents today for 2 month follow-up for diastolic heart failure. I saw her in June 2018 and increase her Lasix. Apparently she has had a sleep study but was not able to afford CPAP. She had just seen Rosaria Ferries for 7 pound weight gain and had her Lasix increased. When I saw her, she was feeling a bit better with better edema. She still noticing fluttering in her chest.  [name] was last seen on August 7 by Rosaria Ferries, PA.  Recent Hospitalizations: ER 03/04/2017 seen forward sounds like dehydration. Furosemide dose was cut back down to 40 mg daily.  Studies Personally Reviewed - (if available, images/films reviewed: From Epic Chart or Care Everywhere)  Venous Duplex 02/2017: No DVT or superficial vein thrombosis. Deep vein reflux in both the right and left lower TIMI. So greater saphenous vein reflux in the right and left saphenofemoral junction. Bilateral small saphenous vein component.  Interval History: [name] returns today overall feeling quite well. She still sleeps about 2 pillows. She shows me her weight at home and she weighs about 3 pounds less than what she currently is here indicating that there is about 3 pound weight difference between her home scale in here. She had been up significant higher, and is now feeling much better. She is a little bit concerned about her trip to the emergency room for dehydration and is now on a lower dose of Lasix. Her breathing is actually pretty stable now. She is working on her CPAP. She was also noted to have significant deep and greater saphenous vein incompetence both of which contribute to her edema. We again discussed importance of support stockings.   From a cardiac standpoint however she is doing fairly well without any  significant chest tightness or pressure or fluttering/palpitations. She does have exertional dyspnea, but it is stable. Heart the really tell she has PND or orthopnea because of her need of CPAP and need for OSA issues. She does have to sleep in a chair/upright. No palpitations, lightheadedness, dizziness, weakness or syncope/near syncope - except for when she went to the ER. No TIA/amaurosis fugax symptoms. No melena, hematochezia, hematuria, or epstaxis. No claudication.  ROS: A comprehensive was performed. Review of Systems  Constitutional: Positive for malaise/fatigue (but better).  HENT: Negative for congestion and nosebleeds.   Respiratory: Positive for shortness of breath. Negative for cough and wheezing.   Cardiovascular: Positive for orthopnea, leg swelling and PND. Negative for claudication.  Gastrointestinal: Negative for blood in stool and melena.  Genitourinary: Negative for flank pain and hematuria.  Musculoskeletal: Positive for joint pain. Negative for falls and myalgias.  Neurological: Negative for dizziness.  Psychiatric/Behavioral: Negative for memory loss. The patient has insomnia. The patient is not nervous/anxious.   All other systems reviewed and are negative.  I have reviewed and (if needed) personally updated the patient's problem list, medications, allergies, past medical and surgical history, social and family history.   Past Medical History:  Diagnosis Date  . Abnormal liver function     in the past.  . Anemia   . Arthritis    all over  . Bruises easily   . Cataract    right eye;immature  . CHF (congestive heart failure) ([place])   .  Chronic back pain    stenosis  . Chronic cough   . Demand myocardial infarction [facility]) 2012   Demand Infarction in setting of Pancreatitis --> mild Troponin elevation, Non-obstructive CAD  . Depression    takes Abilify daily as well as Zoloft  . Diastolic heart failure 2957   Grade 1 diastolic Dysfunction by Echo   .  Diverticulosis   . DM (diabetes mellitus) ([place])    takes Victoza daily as well as Lantus and Humalog  . Empty sella ([place])    on MRI in 2009.  . Glaucoma   . Headache(784.0)    last migraine-4-29yr ago  . History of blood transfusion    no abnormal reaction noted  . History of colon polyps    benign  . History of influenza    antibiotic given yesterday along with taking Delsym cough syrup  . HTN (hypertension)    takes BKinder Morgan Energydaily  . Hyperlipidemia    takes Lipitor daily  . Joint swelling   . Nocturia   . Obstructive sleep apnea   . Pancreatitis    takes Pancrelipase daily  . Parkinson's disease ([place]    takes Sinemet daily  . Peripheral edema    takes Lasix daily  . Peripheral neuropathy   . Pneumonia 2012  . [place]ure disorder ([place]   . Seizures ([place]    takes Lamictal daily and Primidone nightly;last seizure 2wks ago  . Urinary frequency   . Urinary urgency   . Varicose veins of both lower extremities with pain     Past Surgical History:  Procedure Laterality Date  . ABDOMINAL HYSTERECTOMY    . APPENDECTOMY    . BACK SURGERY    . CARDIAC CATHETERIZATION  2009/March 2012   2009: (Dr. KAlvie Heidelberg Nonobstructive CAD; 2012: Minimal CAD --> false positive stress test  . Cardiac Event Monitor  September-October 2017   Sinus rhythm with occasional PACs and artifact. No arrhythmias besides one short run of tachycardia.  . CHOLECYSTECTOMY    . COLONOSCOPY N/A 08/15/2012   Procedure: COLONOSCOPY;  Surgeon: WArta Silence MD;  Location: [facility] ENDOSCOPY;  Service: Endoscopy;  Laterality: N/A;  . ESOPHAGOGASTRODUODENOSCOPY    . eye cysts Bilateral   . LASIK    . NM MYOVIEW LTD  March 2012; Aug 2017   Probably normal Lexiscan Myoview. Mild reversible Anterior Defect ~ Breast Attenuation. (CRO ischemia):  FALSE POSITIVE by Cath; b) 8/'17: LOW RISK, No ischemia or Infarction.  Breast Attenuation not noted.  .Marland KitchenNM MYOVIEW LTD  01/2016   LOW RISK. No ischemia or  infarction.  .Marland KitchenRECTAL POLYPECTOMY    . TONSILLECTOMY    . TRANSTHORACIC ECHOCARDIOGRAM  01/2016   Normal LV chamber size with moderate LVH pattern. EF 50-55%. Severe LA dilation. Moderate RA dilation. PA pressure elevated at 38 mmHg (mild as (  . TUBAL LIGATION    . vaginal cyst removed     several times    Current Meds  Medication Sig  . ACCU-CHEK FASTCLIX LANCETS MISC USE  TO CHECK BLOOD SUGAR THREE TIMES DAILY  . ACCU-CHEK SMARTVIEW test strip CHECK BLOOD SUGAR THREE TIMES DAILY  . acetaminophen (TYLENOL) 500 MG tablet Take 500-1,000 mg by mouth every 4 (four) hours as needed (for pain).   . Alcohol Swabs (B-D SINGLE USE SWABS REGULAR) PADS Use as directed  . ARIPiprazole (ABILIFY) 10 MG tablet Take 10 mg by mouth daily.  .Marland Kitchenaspirin EC 81 MG tablet Take 1 tablet (81 mg total)  by mouth at bedtime.  Marland Kitchen atorvastatin (LIPITOR) 10 MG tablet Take 10 mg by mouth daily.  . B Complex-C (B-COMPLEX WITH VITAMIN C) tablet Take 1 tablet by mouth daily.  . Blood Glucose Monitoring Suppl (ACCU-CHEK NANO SMARTVIEW) W/DEVICE KIT Use to check blood sugar 3 times per day dx code E11.65  . carbidopa-levodopa (SINEMET IR) 25-100 MG per tablet Take 1 tablet by mouth 3 (three) times daily.   . carvedilol (COREG) 25 MG tablet Take 1 tablet (25 mg total) by mouth 2 (two) times daily.  . Cholecalciferol (VITAMIN D-3) 1000 units CAPS Take 1,000 Units by mouth daily.  . cholestyramine (QUESTRAN) 4 G packet Take 4 g by mouth 2 (two) times daily as needed (for diarrhea).   . furosemide (LASIX) 40 MG tablet Take 1 tablet (40 mg total) by mouth daily.  . hydrALAZINE (APRESOLINE) 25 MG tablet TAKE 1 TABLET BY MOUTH TWICE DAILY (Patient taking differently: Take 25 mg by mouth two times a day)  . insulin aspart (NOVOLOG FLEXPEN) 100 UNIT/ML FlexPen Inject4- 6 units before breakfast and lunch and 10-12 units before dinner (Patient taking differently: Inject 6-10 Units into the skin See admin instructions. 6 units before  breakfast then 6 units before lunch then 10 units before dinner (evening meal))  . Insulin Glargine (TOUJEO SOLOSTAR) 300 UNIT/ML SOPN Inject 40 Units into the skin daily. (Patient taking differently: Inject 36 Units into the skin at bedtime. )  . Insulin Pen Needle 31G X 8 MM MISC Use to inject medication 4 times daily  . isosorbide mononitrate (IMDUR) 30 MG 24 hr tablet TAKE 1 TABLET (30 MG TOTAL) BY MOUTH DAILY.  Marland Kitchen JARDIANCE 10 MG TABS tablet TAKE ONE TABLET BY MOUTH ONCE DAILY WITH  BREAKFAST (Patient taking differently: Take 10 mg by mouth once a day with breakfast)  . lamoTRIgine (LAMICTAL) 100 MG tablet Take 100 mg by mouth daily.  . Menthol, Topical Analgesic, 3.5 % GEL Apply 1 application topically as needed (for neuropathy in feet).   . metolazone (ZAROXOLYN) 2.5 MG tablet Take 1 tablet (2.5 mg total) by mouth once a week. (Patient taking differently: Take 2.5 mg by mouth every Saturday. )  . Multiple Vitamins-Minerals (MULTIVITAMIN WITH MINERALS) tablet Take 1 tablet by mouth daily.  . naproxen (NAPROSYN) 500 MG tablet Take 500 mg by mouth 2 (two) times daily as needed for pain.  Marland Kitchen olmesartan (BENICAR) 40 MG tablet Take 40 mg by mouth daily.  . Pancrelipase, Lip-Prot-Amyl, 25000 UNITS CPEP Take 25,000-50,000 Units by mouth See admin instructions. 50,000 units with breakfast then 25,000 units with lunch then 50,000 units with dinner (evening meal)  . potassium chloride SA (K-DUR,KLOR-CON) 20 MEQ tablet Take 2 tablets (40 mEq total) by mouth daily. On the day you take metolazone, take an extra 20 meq (60 meq total) (Patient taking differently: Take 40 mEq by mouth daily. )  . pregabalin (LYRICA) 100 MG capsule Take 100 mg by mouth 2 (two) times daily.  . sertraline (ZOLOFT) 50 MG tablet Take 50 mg by mouth every evening.   Marland Kitchen VICTOZA 18 MG/3ML SOPN INJECT 1.8MG SUBCUTANEOUSLY DAILY (NEEDS APPOINTMENT FOR REFILLS) (Patient taking differently: Inject 1.8 mg into the skin daily at lunchtime)     Allergies  Allergen Reactions  . Penicillins Hives    Has patient had a PCN reaction causing immediate rash, facial/tongue/throat swelling, SOB or lightheadedness with hypotension: Yes Has patient had a PCN reaction causing severe rash involving mucus membranes or skin necrosis: No  Has patient had a PCN reaction that required hospitalization: No Has patient had a PCN reaction occurring within the last 10 years: No If all of the above answers are "NO", then may proceed with Cephalosporin use.   . Sulfa Antibiotics Hives  . Codeine     Headache and makes the patient feel "off"  . Other Rash    B[place]    Social History   Social History  . Marital status: Divorced    Spouse name: N/A  . Number of children: N/A  . Years of education: N/A   Occupational History  . Disabled    Social History Main Topics  . Smoking status: Former Smoker    Packs/day: 0.50    Years: 25.00    Types: Cigarettes  . Smokeless tobacco: Never Used     Comment: quit smoking 20+ytrs ago  . Alcohol use No  . Drug use: No  . Sexual activity: No   Other Topics Concern  . None   Social History Narrative   She lives in [place]. She has lots of family in the area and is accompanied by her sister.   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   She has a distant history of smoking, quit 20 years ago.       family history includes Allergies in her father; Cancer in her other; Diabetes in her brother, mother, sister, and son; Heart disease in her father, mother, and sister; Heart failure in her father; Hyperlipidemia in her father, mother, and sister; Hypertension in her brother, father, mother, sister, sister, and son.  Wt Readings from Last 3 Encounters:  04/01/17 281 lb (127.5 kg)  03/04/17 272 lb (123.4 kg)  02/04/17 274 lb (124.3 kg)    PHYSICAL EXAM BP 125/70   Pulse 65   Ht 5' 1"  (1.549 m)   Wt 281 lb (127.5 kg)   SpO2 (!) 84%   BMI 53.09 kg/m   Physical Exam  Constitutional: She appears well-developed and well-nourished. No distress.  Super morbidly obese. Well groomed  HENT:  Head: Normocephalic and atraumatic.  Neck: No hepatojugular reflux and no JVD present. Carotid bruit is not present.  Cardiovascular: Normal rate and regular rhythm.   No extrasystoles are present. Exam reveals distant heart sounds and decreased pulses (bilateral pedal pulses reduced - partially due to edema). Exam reveals no gallop.   Murmur (soft SM heard @ [facility]LS[facility]) heard. Pulmonary/Chest: Effort normal. No respiratory distress. She has no wheezes. She has no rales.  Abdominal: Soft. Bowel sounds are normal. She exhibits no distension. There is no tenderness. There is no rebound.  Musculoskeletal: She exhibits edema (1-2+ - stable).  Neurological: She is alert.  Skin: Skin is warm and dry.  Psychiatric: She has a normal mood and affect. Her behavior is normal. Judgment and thought content normal.  Nursing note and vitals reviewed.    Adult ECG Report n/a  Other studies Reviewed: Additional studies/ records that were reviewed today include:  Recent Labs:   Lab Results  Component Value Date   CREATININE 1.50 (H) 03/04/2017   BUN 25 (H) 03/04/2017   NA 132 (L) 03/04/2017   K 3.4 (L) 03/04/2017   CL 94 (L) 03/04/2017   CO2 29 03/04/2017   Lab Results  Component Value Date   CHOL 135 09/30/2015   HDL 62.50 09/30/2015   LDLCALC 59 09/30/2015   LDLDIRECT 66.0 01/27/2015   TRIG 71.0 09/30/2015   CHOLHDL 2 09/30/2015  ASSESSMENT / PLAN:  [name] actually returns today doing quite well. She remains a stable dry weight. We spent quite a bit time talking about how to monitor her dry weight and adjust her diuretic accordingly. Now I think we will stay on her current dose of diuretic. I discussed sliding scale for her Lasix plus or minus Zaroxolyn. (See instructions below)  She is working on getting her CPAP, think this will definitely help her  sleeping and orthopnea/PND. We will also likely help her blood pressure control. For her diastolic heart failure she is on carvedilol as well as hydralazine/Imdur. This is a conjunction with her Benicar and standing dose of Lasix.  No active anginal symptoms. She is on statin along with beta blocker and aspirin.  Quite a bit a lotion or edema is now clearly related to varicose veins/venous stasis and reflux. Discussed the importance of wearing her support stockings and feet elevation. The fact that she has bilateral deep vein insufficiency is concerning - may not be a good candidate for superficial vein ablation.  Despite being on sliding scale Lasix, she always needs to ensure that she stays adequately hydrated.  Problem List Items Addressed This Visit    Chronic diastolic congestive heart failure (HCC) - Primary (Chronic)   Coronary artery disease, non-occlusive (Chronic)   Hypertensive heart disease (Chronic)   Morbid obesity with BMI of 50.0-59.9, adult (HCC) (Chronic)   Peripheral edema (Chronic)   Varicose veins of bilateral lower extremities with other complications      Current medicines are reviewed at length with the patient today. (+/- concerns) what dose Lasix. The following changes have been made: see below   Patient Instructions  .Sliding scale Lasix: Weigh yourself when you get home, then Daily in the Morning. Your dry weight will be what your scale says on the day you return home.(here is 278 lbs.).   If you gain more than 3 pounds from dry weight: Increase the Lasix dosing to 40 mg in the morning and 40 mg in the afternoon until weight returns to baseline dry weight.or You can take 2.5 mg METOLAZONE 30 MINUTES BEFORE MORNING FUROSEMIDE  If weight gain is greater than 5 pounds in 2 days: Increased to Lasix 80 mg twice a day and contact the office for further assistance if weight does not go down the next day.  If the weight goes down more than 3 pounds from dry  weight: Hold Lasix until it returns to baseline dry weight   Drink plenty of fluids  Your physician recommends that you schedule a follow-up appointment in 3 months with [place] wants you to follow-up in [place]. You will receive a reminder letter in the mail two months in advance. If you don't receive a letter, please call our office to schedule the follow-up appointment.    Studies Ordered:   No orders of the defined types were placed in this encounter.     Glenetta Hew, M.D., M.S. Interventional Cardiologist   Pager # [phone] Phone # [phone] [address] [place], [place] [address]

## 2017-04-01 NOTE — Patient Instructions (Addendum)
.  Sliding scale Lasix: Weigh yourself when you get home, then Daily in the Morning. Your dry weight will be what your scale says on the day you return home.(here is 278 lbs.).   If you gain more than 3 pounds from dry weight: Increase the Lasix dosing to 40 mg in the morning and 40 mg in the afternoon until weight returns to baseline dry weight.or You can take 2.5 mg METOLAZONE 30 MINUTES BEFORE MORNING FUROSEMIDE  If weight gain is greater than 5 pounds in 2 days: Increased to Lasix 80 mg twice a day and contact the office for further assistance if weight does not go down the next day.  If the weight goes down more than 3 pounds from dry weight: Hold Lasix until it returns to baseline dry weight   Drink plenty of fluids  Your physician recommends that you schedule a follow-up appointment in 3 months with [place] wants you to follow-up in [place]. You will receive a reminder letter in the mail two months in advance. If you don't receive a letter, please call our office to schedule the follow-up appointment.

## 2017-04-03 ENCOUNTER — Encounter: Payer: Self-pay | Admitting: Cardiology

## 2017-04-03 ENCOUNTER — Other Ambulatory Visit: Payer: Self-pay | Admitting: *Deleted

## 2017-04-03 NOTE — Patient Outreach (Signed)
[facility]) Care Management  04/03/2017  [name] Oct 07, 1945 [id number]   CSW will perform a case closure on patient, due to inability to maintain phone contact, despite required number of phone attempts made and outreach letter mailed to patient's home.  CSW will notify patient's [place] with [place] Management, Johny Shock of CSW's plans to close patient's case.  CSW will fax an update to patient's Primary Care Physician, Dr. [name] to ensure that they are aware of CSW's involvement with patient's plan of care.  CSW will submit a case closure request to Verlon Setting, Care Management Assistant with [place] Management, in the form of an In [employer].  CSW will ensure that [name] is aware of Mrs. Pleasant's, [place] with [facility] Management, continued involvement with patient's care. Nat Christen, BSW, MSW, LCSW  Licensed Education officer, environmental Health System  Mailing [place] [address] [place], [place] [address] Physical Address-[address], [place], [place] [address] Toll Free Main # [phone] Fax # [phone] Cell # [phone]  Office # [phone] [email or web address]

## 2017-04-17 ENCOUNTER — Other Ambulatory Visit: Payer: Self-pay | Admitting: *Deleted

## 2017-04-17 NOTE — Patient Outreach (Signed)
[facility]) Care Management  04/17/2017  [name] Jul 26, 1945 [id number]   RN Health Coach telephone call to patient.  Hipaa compliance verified. Per patient she is getting ready to go out. RN discussed with patient about checking on the cost of the CPAP supplies only. Patient will call the social worker tomorrow and RN will call the patient back on Friday. If needed can do a three way call with advance and patient to get the actual information. Patient is getting ready to go out now and unable to talk.   Plan: RN will call patient back on Friday October 27,2018 RN will do follow up  [place] [facility] [phone]

## 2017-04-17 NOTE — Patient Outreach (Signed)
[facility]) Care Management  04/17/2017  [name] 1945-11-07 [id number]   CSW received a return call from patient today, but CSW was unavailable.  CSW attempted to return patient's call, but patient was unavailable.  A HIPAA compliant message was left for patient on voicemail and CSW is currently awaiting a return call. Nat Christen, BSW, MSW, LCSW  Licensed Education officer, environmental Health System  Mailing [place] [address] [place], [place] [address] Physical Address-[address], [place], [place] [address] Toll Free Main # [phone] Fax # [phone] Cell # [phone]  Office # [phone] [email or web address]

## 2017-05-08 DIAGNOSIS — Z713 Dietary counseling and surveillance: Secondary | ICD-10-CM | POA: Diagnosis not present

## 2017-05-08 DIAGNOSIS — K625 Hemorrhage of anus and rectum: Secondary | ICD-10-CM | POA: Diagnosis not present

## 2017-05-08 DIAGNOSIS — E118 Type 2 diabetes mellitus with unspecified complications: Secondary | ICD-10-CM | POA: Diagnosis not present

## 2017-05-08 DIAGNOSIS — Z6841 Body Mass Index (BMI) 40.0 and over, adult: Secondary | ICD-10-CM | POA: Diagnosis not present

## 2017-05-29 ENCOUNTER — Other Ambulatory Visit: Payer: Self-pay | Admitting: Cardiology

## 2017-06-13 ENCOUNTER — Telehealth: Payer: Self-pay | Admitting: Cardiology

## 2017-06-13 NOTE — Telephone Encounter (Signed)
[name] is calling because she has been having some shortness of breath . When she walk thru her house and tot he car she is huffing and puffing . Please call at [phone]

## 2017-06-13 NOTE — Telephone Encounter (Addendum)
Returned the call to the patient. She stated that she has had increased shortness of breath and has difficulty ambulating from one end of the house to the other. The patient stated that her current weight is 286. She was unsure of what her dry weight should be. She stated that she normally stays in the low 280's.   She currently takes Furosemide 40 mg and 80 mg every other day (alternating days) She takes Metolazone 2.5 mg every Saturday. Per instructions she can take:      If you gain more than 3 pounds from dry weight: Increase the Lasix dosing to 40 mg in the morning and 40 mg in the afternoon until weight returns to baseline dry weight.or You can take 2.5 mg METOLAZONE 30 MINUTES BEFORE MORNING FUROSEMIDE  If weight gain is greater than 5 pounds in 2 days: Increased to Lasix 80 mg twice a day and contact the office for further assistance if weight does not go down the next day.  If the weight goes down more than 3 pounds from dry weight: Hold Lasix until it returns to baseline dry weight   The patient will take Furosemide 40 mg bid for the next three days and her Metolazone on Saturday. She will call the office back with an update.

## 2017-06-13 NOTE — Telephone Encounter (Signed)
Agree with plan.  Will see how she does.  Glenetta Hew, MD

## 2017-06-15 ENCOUNTER — Encounter (HOSPITAL_COMMUNITY): Payer: Self-pay | Admitting: Emergency Medicine

## 2017-06-15 ENCOUNTER — Other Ambulatory Visit (HOSPITAL_COMMUNITY): Payer: Medicare HMO

## 2017-06-15 ENCOUNTER — Emergency Department (HOSPITAL_COMMUNITY): Payer: Medicare HMO

## 2017-06-15 ENCOUNTER — Other Ambulatory Visit: Payer: Self-pay

## 2017-06-15 ENCOUNTER — Inpatient Hospital Stay (HOSPITAL_COMMUNITY)
Admission: EM | Admit: 2017-06-15 | Discharge: 2017-06-18 | DRG: 100 | Disposition: A | Discharge status: Home Health | Payer: Medicare HMO | Attending: Internal Medicine | Admitting: Internal Medicine

## 2017-06-15 ENCOUNTER — Observation Stay (HOSPITAL_COMMUNITY): Payer: Medicare HMO

## 2017-06-15 DIAGNOSIS — G4489 Other headache syndrome: Secondary | ICD-10-CM | POA: Diagnosis not present

## 2017-06-15 DIAGNOSIS — I5032 Chronic diastolic (congestive) heart failure: Secondary | ICD-10-CM | POA: Diagnosis present

## 2017-06-15 DIAGNOSIS — E1165 Type 2 diabetes mellitus with hyperglycemia: Secondary | ICD-10-CM | POA: Diagnosis present

## 2017-06-15 DIAGNOSIS — Z7982 Long term (current) use of aspirin: Secondary | ICD-10-CM | POA: Diagnosis not present

## 2017-06-15 DIAGNOSIS — K633 Ulcer of intestine: Secondary | ICD-10-CM | POA: Diagnosis not present

## 2017-06-15 DIAGNOSIS — Z888 Allergy status to other drugs, medicaments and biological substances status: Secondary | ICD-10-CM

## 2017-06-15 DIAGNOSIS — I110 Hypertensive heart disease with heart failure: Secondary | ICD-10-CM | POA: Diagnosis present

## 2017-06-15 DIAGNOSIS — Z8249 Family history of ischemic heart disease and other diseases of the circulatory system: Secondary | ICD-10-CM

## 2017-06-15 DIAGNOSIS — R0602 Shortness of breath: Secondary | ICD-10-CM

## 2017-06-15 DIAGNOSIS — R2990 Unspecified symptoms and signs involving the nervous system: Secondary | ICD-10-CM | POA: Diagnosis not present

## 2017-06-15 DIAGNOSIS — K922 Gastrointestinal hemorrhage, unspecified: Secondary | ICD-10-CM | POA: Diagnosis not present

## 2017-06-15 DIAGNOSIS — K5730 Diverticulosis of large intestine without perforation or abscess without bleeding: Secondary | ICD-10-CM | POA: Diagnosis present

## 2017-06-15 DIAGNOSIS — G8384 Todd's paralysis (postepileptic): Secondary | ICD-10-CM | POA: Diagnosis present

## 2017-06-15 DIAGNOSIS — K648 Other hemorrhoids: Secondary | ICD-10-CM | POA: Diagnosis present

## 2017-06-15 DIAGNOSIS — J9621 Acute and chronic respiratory failure with hypoxia: Secondary | ICD-10-CM | POA: Diagnosis present

## 2017-06-15 DIAGNOSIS — Z6841 Body Mass Index (BMI) 40.0 and over, adult: Secondary | ICD-10-CM | POA: Diagnosis not present

## 2017-06-15 DIAGNOSIS — Z90710 Acquired absence of both cervix and uterus: Secondary | ICD-10-CM

## 2017-06-15 DIAGNOSIS — Z880 Allergy status to penicillin: Secondary | ICD-10-CM | POA: Diagnosis not present

## 2017-06-15 DIAGNOSIS — K861 Other chronic pancreatitis: Secondary | ICD-10-CM | POA: Diagnosis present

## 2017-06-15 DIAGNOSIS — I5033 Acute on chronic diastolic (congestive) heart failure: Secondary | ICD-10-CM | POA: Diagnosis present

## 2017-06-15 DIAGNOSIS — H538 Other visual disturbances: Secondary | ICD-10-CM | POA: Diagnosis not present

## 2017-06-15 DIAGNOSIS — Z8673 Personal history of transient ischemic attack (TIA), and cerebral infarction without residual deficits: Secondary | ICD-10-CM

## 2017-06-15 DIAGNOSIS — Z87891 Personal history of nicotine dependence: Secondary | ICD-10-CM | POA: Diagnosis not present

## 2017-06-15 DIAGNOSIS — E1169 Type 2 diabetes mellitus with other specified complication: Secondary | ICD-10-CM | POA: Diagnosis not present

## 2017-06-15 DIAGNOSIS — Z79899 Other long term (current) drug therapy: Secondary | ICD-10-CM | POA: Diagnosis not present

## 2017-06-15 DIAGNOSIS — Z794 Long term (current) use of insulin: Secondary | ICD-10-CM

## 2017-06-15 DIAGNOSIS — R29700 NIHSS score 0: Secondary | ICD-10-CM | POA: Diagnosis present

## 2017-06-15 DIAGNOSIS — I252 Old myocardial infarction: Secondary | ICD-10-CM

## 2017-06-15 DIAGNOSIS — R0609 Other forms of dyspnea: Secondary | ICD-10-CM

## 2017-06-15 DIAGNOSIS — E119 Type 2 diabetes mellitus without complications: Secondary | ICD-10-CM | POA: Diagnosis present

## 2017-06-15 DIAGNOSIS — K5190 Ulcerative colitis, unspecified, without complications: Secondary | ICD-10-CM | POA: Diagnosis not present

## 2017-06-15 DIAGNOSIS — F329 Major depressive disorder, single episode, unspecified: Secondary | ICD-10-CM | POA: Diagnosis present

## 2017-06-15 DIAGNOSIS — I5031 Acute diastolic (congestive) heart failure: Secondary | ICD-10-CM | POA: Diagnosis not present

## 2017-06-15 DIAGNOSIS — G8194 Hemiplegia, unspecified affecting left nondominant side: Secondary | ICD-10-CM | POA: Diagnosis present

## 2017-06-15 DIAGNOSIS — R569 Unspecified convulsions: Secondary | ICD-10-CM

## 2017-06-15 DIAGNOSIS — R0989 Other specified symptoms and signs involving the circulatory and respiratory systems: Secondary | ICD-10-CM | POA: Diagnosis not present

## 2017-06-15 DIAGNOSIS — N183 Chronic kidney disease, stage 3 unspecified: Secondary | ICD-10-CM | POA: Diagnosis present

## 2017-06-15 DIAGNOSIS — I2510 Atherosclerotic heart disease of native coronary artery without angina pectoris: Secondary | ICD-10-CM | POA: Diagnosis present

## 2017-06-15 DIAGNOSIS — E662 Morbid (severe) obesity with alveolar hypoventilation: Secondary | ICD-10-CM | POA: Diagnosis present

## 2017-06-15 DIAGNOSIS — D62 Acute posthemorrhagic anemia: Secondary | ICD-10-CM | POA: Diagnosis present

## 2017-06-15 DIAGNOSIS — K625 Hemorrhage of anus and rectum: Secondary | ICD-10-CM | POA: Diagnosis not present

## 2017-06-15 DIAGNOSIS — Z882 Allergy status to sulfonamides status: Secondary | ICD-10-CM | POA: Diagnosis not present

## 2017-06-15 DIAGNOSIS — Z885 Allergy status to narcotic agent status: Secondary | ICD-10-CM

## 2017-06-15 DIAGNOSIS — K529 Noninfective gastroenteritis and colitis, unspecified: Secondary | ICD-10-CM | POA: Diagnosis not present

## 2017-06-15 DIAGNOSIS — K921 Melena: Secondary | ICD-10-CM | POA: Diagnosis present

## 2017-06-15 DIAGNOSIS — G8190 Hemiplegia, unspecified affecting unspecified side: Secondary | ICD-10-CM | POA: Diagnosis not present

## 2017-06-15 DIAGNOSIS — E785 Hyperlipidemia, unspecified: Secondary | ICD-10-CM | POA: Diagnosis present

## 2017-06-15 DIAGNOSIS — R29818 Other symptoms and signs involving the nervous system: Secondary | ICD-10-CM | POA: Diagnosis not present

## 2017-06-15 DIAGNOSIS — R0600 Dyspnea, unspecified: Secondary | ICD-10-CM | POA: Diagnosis present

## 2017-06-15 DIAGNOSIS — D125 Benign neoplasm of sigmoid colon: Secondary | ICD-10-CM | POA: Diagnosis present

## 2017-06-15 DIAGNOSIS — Z833 Family history of diabetes mellitus: Secondary | ICD-10-CM

## 2017-06-15 DIAGNOSIS — G20 Parkinson's disease: Secondary | ICD-10-CM | POA: Diagnosis present

## 2017-06-15 DIAGNOSIS — R531 Weakness: Secondary | ICD-10-CM | POA: Diagnosis not present

## 2017-06-15 DIAGNOSIS — G40909 Epilepsy, unspecified, not intractable, without status epilepticus: Principal | ICD-10-CM

## 2017-06-15 DIAGNOSIS — R0902 Hypoxemia: Secondary | ICD-10-CM

## 2017-06-15 DIAGNOSIS — I6789 Other cerebrovascular disease: Secondary | ICD-10-CM | POA: Diagnosis not present

## 2017-06-15 DIAGNOSIS — J9611 Chronic respiratory failure with hypoxia: Secondary | ICD-10-CM

## 2017-06-15 DIAGNOSIS — I361 Nonrheumatic tricuspid (valve) insufficiency: Secondary | ICD-10-CM | POA: Diagnosis not present

## 2017-06-15 LAB — I-STAT CHEM 8, ED
BUN: 19 mg/dL (ref 6–20)
Calcium, Ion: 1 mmol/L — ABNORMAL LOW (ref 1.15–1.40)
Chloride: 103 mmol/L (ref 101–111)
Creatinine, Ser: 1 mg/dL (ref 0.44–1.00)
Glucose, Bld: 110 mg/dL — ABNORMAL HIGH (ref 65–99)
HCT: 41 % (ref 36.0–46.0)
Hemoglobin: 13.9 g/dL (ref 12.0–15.0)
Potassium: 4 mmol/L (ref 3.5–5.1)
Sodium: 140 mmol/L (ref 135–145)
TCO2: 28 mmol/L (ref 22–32)

## 2017-06-15 LAB — GLUCOSE, CAPILLARY
Glucose-Capillary: 130 mg/dL — ABNORMAL HIGH (ref 65–99)
Glucose-Capillary: 96 mg/dL (ref 65–99)

## 2017-06-15 LAB — CBG MONITORING, ED: Glucose-Capillary: 106 mg/dL — ABNORMAL HIGH (ref 65–99)

## 2017-06-15 LAB — DIFFERENTIAL
Basophils Absolute: 0 10*3/uL (ref 0.0–0.1)
Basophils Relative: 0 %
Eosinophils Absolute: 0.2 10*3/uL (ref 0.0–0.7)
Eosinophils Relative: 4 %
Lymphocytes Relative: 35 %
Lymphs Abs: 1.9 10*3/uL (ref 0.7–4.0)
Monocytes Absolute: 0.7 10*3/uL (ref 0.1–1.0)
Monocytes Relative: 13 %
Neutro Abs: 2.7 10*3/uL (ref 1.7–7.7)
Neutrophils Relative %: 48 %

## 2017-06-15 LAB — COMPREHENSIVE METABOLIC PANEL
ALT: 21 U/L (ref 14–54)
AST: 35 U/L (ref 15–41)
Albumin: 4.1 g/dL (ref 3.5–5.0)
Alkaline Phosphatase: 122 U/L (ref 38–126)
Anion gap: 10 (ref 5–15)
BUN: 16 mg/dL (ref 6–20)
CO2: 23 mmol/L (ref 22–32)
Calcium: 8.9 mg/dL (ref 8.9–10.3)
Chloride: 104 mmol/L (ref 101–111)
Creatinine, Ser: 1.04 mg/dL — ABNORMAL HIGH (ref 0.44–1.00)
GFR calc Af Amer: >60 mL/min (ref >60)
GFR calc non Af Amer: 54 mL/min — ABNORMAL LOW (ref >60)
Glucose, Bld: 107 mg/dL — ABNORMAL HIGH (ref 65–99)
Potassium: 4.1 mmol/L (ref 3.5–5.1)
Sodium: 137 mmol/L (ref 135–145)
Total Bilirubin: 0.7 mg/dL (ref 0.3–1.2)
Total Protein: 8.1 g/dL (ref 6.5–8.1)

## 2017-06-15 LAB — CBC
HCT: 37.9 % (ref 36.0–46.0)
Hemoglobin: 12.3 g/dL (ref 12.0–15.0)
MCH: 24.4 pg — ABNORMAL LOW (ref 26.0–34.0)
MCHC: 32.5 g/dL (ref 30.0–36.0)
MCV: 75.2 fL — ABNORMAL LOW (ref 78.0–100.0)
Platelets: 259 10*3/uL (ref 150–400)
RBC: 5.04 MIL/uL (ref 3.87–5.11)
RDW: 17 % — ABNORMAL HIGH (ref 11.5–15.5)
WBC: 5.5 10*3/uL (ref 4.0–10.5)

## 2017-06-15 LAB — I-STAT TROPONIN, ED: Troponin i, poc: 0.01 ng/mL (ref 0.00–0.08)

## 2017-06-15 LAB — HEMOGLOBIN AND HEMATOCRIT, BLOOD
HCT: 33.3 % — ABNORMAL LOW (ref 36.0–46.0)
Hemoglobin: 10.2 g/dL — ABNORMAL LOW (ref 12.0–15.0)

## 2017-06-15 LAB — BRAIN NATRIURETIC PEPTIDE: B Natriuretic Peptide: 290.1 pg/mL — ABNORMAL HIGH (ref 0.0–100.0)

## 2017-06-15 LAB — APTT: aPTT: 40 seconds — ABNORMAL HIGH (ref 24–36)

## 2017-06-15 LAB — OCCULT BLOOD X 1 CARD TO LAB, STOOL: Fecal Occult Bld: POSITIVE — AB

## 2017-06-15 LAB — PROTIME-INR
INR: 1.16
Prothrombin Time: 14.7 seconds (ref 11.4–15.2)

## 2017-06-15 MED ORDER — IRBESARTAN 300 MG PO TABS
300.0000 mg | ORAL_TABLET | Freq: Every day | ORAL | Status: DC
  Administered 2017-06-15 – 2017-06-18 (×4): 300 mg via ORAL
  Filled 2017-06-15 (×4): qty 1

## 2017-06-15 MED ORDER — ACETAMINOPHEN 325 MG PO TABS
650.0000 mg | ORAL_TABLET | ORAL | Status: DC | PRN
  Administered 2017-06-17: 650 mg via ORAL
  Filled 2017-06-15: qty 2

## 2017-06-15 MED ORDER — ATORVASTATIN CALCIUM 10 MG PO TABS
10.0000 mg | ORAL_TABLET | Freq: Every evening | ORAL | Status: DC
  Administered 2017-06-15 – 2017-06-17 (×3): 10 mg via ORAL
  Filled 2017-06-15 (×3): qty 1

## 2017-06-15 MED ORDER — PREGABALIN 50 MG PO CAPS
100.0000 mg | ORAL_CAPSULE | Freq: Two times a day (BID) | ORAL | Status: DC
  Administered 2017-06-15 – 2017-06-18 (×7): 100 mg via ORAL
  Filled 2017-06-15 (×7): qty 2

## 2017-06-15 MED ORDER — CARVEDILOL 12.5 MG PO TABS
25.0000 mg | ORAL_TABLET | Freq: Two times a day (BID) | ORAL | Status: DC
  Administered 2017-06-15 – 2017-06-18 (×7): 25 mg via ORAL
  Filled 2017-06-15 (×7): qty 2

## 2017-06-15 MED ORDER — FUROSEMIDE 10 MG/ML IJ SOLN
40.0000 mg | Freq: Once | INTRAMUSCULAR | Status: DC
  Filled 2017-06-15: qty 4

## 2017-06-15 MED ORDER — INSULIN GLARGINE 100 UNIT/ML ~~LOC~~ SOLN
36.0000 [IU] | Freq: Every day | SUBCUTANEOUS | Status: DC
  Administered 2017-06-15 – 2017-06-17 (×3): 36 [IU] via SUBCUTANEOUS
  Filled 2017-06-15 (×4): qty 0.36

## 2017-06-15 MED ORDER — ISOSORBIDE MONONITRATE ER 30 MG PO TB24
30.0000 mg | ORAL_TABLET | Freq: Every day | ORAL | Status: DC
  Administered 2017-06-15 – 2017-06-18 (×4): 30 mg via ORAL
  Filled 2017-06-15 (×4): qty 1

## 2017-06-15 MED ORDER — LAMOTRIGINE 100 MG PO TABS
100.0000 mg | ORAL_TABLET | Freq: Two times a day (BID) | ORAL | Status: DC
  Administered 2017-06-15 – 2017-06-18 (×7): 100 mg via ORAL
  Filled 2017-06-15 (×7): qty 1

## 2017-06-15 MED ORDER — ACETAMINOPHEN 160 MG/5ML PO SOLN: 650.0000 mg | ORAL | Status: DC | PRN

## 2017-06-15 MED ORDER — LAMOTRIGINE 100 MG PO TABS: 100.0000 mg | ORAL_TABLET | Freq: Every day | ORAL | Status: DC

## 2017-06-15 MED ORDER — PANCRELIPASE (LIP-PROT-AMYL) 25000 UNITS PO CPEP: 25000.0000 [IU] | ORAL_CAPSULE | ORAL | Status: DC

## 2017-06-15 MED ORDER — POTASSIUM CHLORIDE CRYS ER 20 MEQ PO TBCR
40.0000 meq | EXTENDED_RELEASE_TABLET | Freq: Every day | ORAL | Status: DC
  Administered 2017-06-15 – 2017-06-18 (×4): 40 meq via ORAL
  Filled 2017-06-15 (×4): qty 2

## 2017-06-15 MED ORDER — INSULIN ASPART 100 UNIT/ML ~~LOC~~ SOLN
0.0000 [IU] | Freq: Three times a day (TID) | SUBCUTANEOUS | Status: DC
  Administered 2017-06-15 – 2017-06-16 (×2): 2 [IU] via SUBCUTANEOUS
  Administered 2017-06-17: 5 [IU] via SUBCUTANEOUS

## 2017-06-15 MED ORDER — METOLAZONE 2.5 MG PO TABS
2.5000 mg | ORAL_TABLET | ORAL | Status: DC
  Administered 2017-06-15: 2.5 mg via ORAL
  Filled 2017-06-15: qty 1

## 2017-06-15 MED ORDER — STROKE: EARLY STAGES OF RECOVERY BOOK
Freq: Once | Status: AC
  Administered 2017-06-18: 06:00:00
  Filled 2017-06-15: qty 1

## 2017-06-15 MED ORDER — LORAZEPAM 2 MG/ML IJ SOLN: 1.0000 mg | INTRAMUSCULAR | Status: DC | PRN

## 2017-06-15 MED ORDER — ACETAMINOPHEN 650 MG RE SUPP: 650.0000 mg | RECTAL | Status: DC | PRN

## 2017-06-15 MED ORDER — PANCRELIPASE (LIP-PROT-AMYL) 12000-38000 UNITS PO CPEP
24000.0000 [IU] | ORAL_CAPSULE | Freq: Every day | ORAL | Status: DC
  Administered 2017-06-15 – 2017-06-17 (×3): 24000 [IU] via ORAL
  Filled 2017-06-15 (×4): qty 2

## 2017-06-15 MED ORDER — FUROSEMIDE 40 MG PO TABS
40.0000 mg | ORAL_TABLET | Freq: Two times a day (BID) | ORAL | Status: DC
  Administered 2017-06-15 – 2017-06-16 (×3): 40 mg via ORAL
  Filled 2017-06-15 (×3): qty 1

## 2017-06-15 MED ORDER — PANCRELIPASE (LIP-PROT-AMYL) 12000-38000 UNITS PO CPEP
48000.0000 [IU] | ORAL_CAPSULE | ORAL | Status: DC
  Administered 2017-06-15 – 2017-06-17 (×4): 48000 [IU] via ORAL
  Filled 2017-06-15 (×4): qty 4

## 2017-06-15 MED ORDER — CARBIDOPA-LEVODOPA 25-100 MG PO TABS
1.0000 | ORAL_TABLET | Freq: Three times a day (TID) | ORAL | Status: DC
  Administered 2017-06-15 – 2017-06-18 (×8): 1 via ORAL
  Filled 2017-06-15 (×9): qty 1

## 2017-06-15 MED ORDER — INSULIN GLARGINE 300 UNIT/ML ~~LOC~~ SOPN: 36.0000 [IU] | PEN_INJECTOR | Freq: Every day | SUBCUTANEOUS | Status: DC

## 2017-06-15 MED ORDER — SERTRALINE HCL 50 MG PO TABS
50.0000 mg | ORAL_TABLET | Freq: Every evening | ORAL | Status: DC
  Administered 2017-06-15 – 2017-06-17 (×3): 50 mg via ORAL
  Filled 2017-06-15 (×3): qty 1

## 2017-06-15 NOTE — Progress Notes (Signed)
EEG complete - results pending 

## 2017-06-15 NOTE — ED Notes (Signed)
OK to cancel code stroke per Dr. Lorraine Lax

## 2017-06-15 NOTE — Progress Notes (Signed)
Patient with large amount loose stool with noted blood.  Patient stated she had been having loose bloody stools since Friday, 06/14/17.  MD text paged.

## 2017-06-15 NOTE — ED Provider Notes (Signed)
[facility] EMERGENCY DEPARTMENT Provider Note   CSN: [id number] Arrival date & time: 06/15/17  0401     History   Chief Complaint Chief Complaint  Patient presents with  . Code Stroke    HPI [name] is a 71 y.o. female.  Patient presents to the emergency department for evaluation of headache, visual disturbance and left-sided weakness.  Patient reports that she went to bed around 10 PM.  She was feeling well at that time.  She was awakened from sleep at 2 AM with severe headache.  It was at that time that she noticed that she was having trouble seeing and was feeling dizzy.  She then noticed that she could not move her left arm as well as her right.  She is brought to the ER by EMS.  She is complaining of a severe, constant headache.  She reports that she has not had a headache in years.      Past Medical History:  Diagnosis Date  . Abnormal liver function     in the past.  . Anemia   . Arthritis    all over  . Bruises easily   . Cataract    right eye;immature  . CHF (congestive heart failure) ([place])   . Chronic back pain    stenosis  . Chronic cough   . Demand myocardial infarction [facility]) 2012   Demand Infarction in setting of Pancreatitis --> mild Troponin elevation, Non-obstructive CAD  . Depression    takes Abilify daily as well as Zoloft  . Diastolic heart failure 4166   Grade 1 diastolic Dysfunction by Echo   . Diverticulosis   . DM (diabetes mellitus) ([place])    takes Victoza daily as well as Lantus and Humalog  . Empty sella ([place])    on MRI in 2009.  . Glaucoma   . Headache(784.0)    last migraine-4-20yr ago  . History of blood transfusion    no abnormal reaction noted  . History of colon polyps    benign  . History of influenza    antibiotic given yesterday along with taking Delsym cough syrup  . HTN (hypertension)    takes BK[employer] Morgan Energydaily  . Hyperlipidemia    takes Lipitor daily  . Joint swelling   .  Nocturia   . Obstructive sleep apnea   . Pancreatitis    takes Pancrelipase daily  . Parkinson's disease ([place]    takes Sinemet daily  . Peripheral edema    takes Lasix daily  . Peripheral neuropathy   . Pneumonia 2012  . Seizure disorder ([place]   . Seizures ([place]    takes Lamictal daily and Primidone nightly;last seizure 2wks ago  . Urinary frequency   . Urinary urgency   . Varicose veins of both lower extremities with pain     Patient Active Problem List   Diagnosis Date Noted  . Dehydration 03/07/2017  . Medication management 12/24/2016  . Pre-syncope 03/09/2016  . Racing heart beat 03/09/2016  . DOE (dyspnea on exertion) 01/28/2016  . Spinal stenosis at L4-L5 level 10/04/2014  . Lower abdominal pain 10/31/2012  . Type II diabetes mellitus, uncontrolled ([place] 10/31/2012  . Seizure disorder ([place] 10/31/2012  . Diverticulosis 10/31/2012  . Anemia associated with acute blood loss 08/14/2012  . Hematochezia 08/14/2012  . Varicose veins of bilateral lower extremities with other complications 006/30/1601 . Leg pain 03/21/2012  . Radicular pain of left lower extremity 03/21/2012  . Infected  pseudocyst of pancreas 10/28/2011  . Pancreatitis chronic 10/28/2011  . Hypertensive heart disease 10/28/2011  . Chronic respiratory failure ([place]) 10/28/2011  . Dyslipidemia, goal LDL below 100 10/28/2011  . Morbid obesity with BMI of 50.0-59.9, adult ([place]) 10/28/2011  . Peripheral edema 04/14/2011  . Obesity hypoventilation syndrome ([place]) 10/26/2010  . Chronic diastolic congestive heart failure ([place]) 10/24/2010  . Coronary artery disease, non-occlusive 08/28/2010    Past Surgical History:  Procedure Laterality Date  . ABDOMINAL HYSTERECTOMY    . APPENDECTOMY    . BACK SURGERY    . CARDIAC CATHETERIZATION  2009/March 2012   2009: (Dr. Alvie Heidelberg) Nonobstructive CAD; 2012: Minimal CAD --> false positive stress test  . Cardiac Event Monitor  September-October 2017   Sinus rhythm with  occasional PACs and artifact. No arrhythmias besides one short run of tachycardia.  . CHOLECYSTECTOMY    . COLONOSCOPY N/A 08/15/2012   Procedure: COLONOSCOPY;  Surgeon: Arta Silence, MD;  Location: [facility] ENDOSCOPY;  Service: Endoscopy;  Laterality: N/A;  . ESOPHAGOGASTRODUODENOSCOPY    . eye cysts Bilateral   . LASIK    . NM MYOVIEW LTD  March 2012; Aug 2017   Probably normal Lexiscan Myoview. Mild reversible Anterior Defect ~ Breast Attenuation. (CRO ischemia):  FALSE POSITIVE by Cath; b) 8/'17: LOW RISK, No ischemia or Infarction.  Breast Attenuation not noted.  Marland Kitchen NM MYOVIEW LTD  01/2016   LOW RISK. No ischemia or infarction.  Marland Kitchen RECTAL POLYPECTOMY    . TONSILLECTOMY    . TRANSTHORACIC ECHOCARDIOGRAM  01/2016   Normal LV chamber size with moderate LVH pattern. EF 50-55%. Severe LA dilation. Moderate RA dilation. PA pressure elevated at 38 mmHg (mild as (  . TUBAL LIGATION    . vaginal cyst removed     several times    OB History    No data available       Home Medications    Prior to Admission medications   Medication Sig Start Date End Date Taking? Authorizing Provider  ACCU-CHEK FASTCLIX LANCETS MISC USE  TO CHECK BLOOD SUGAR THREE TIMES DAILY 06/24/15   Elayne Snare, MD  ACCU-CHEK SMARTVIEW test strip CHECK BLOOD SUGAR THREE TIMES DAILY 04/17/16   Elayne Snare, MD  acetaminophen (TYLENOL) 500 MG tablet Take 500-1,000 mg by mouth every 4 (four) hours as needed (for pain).     [provider]  Alcohol Swabs (B-D SINGLE USE SWABS REGULAR) PADS Use as directed 12/22/15   Elayne Snare, MD  ARIPiprazole (ABILIFY) 10 MG tablet Take 10 mg by mouth daily.    [provider]  aspirin EC 81 MG tablet Take 1 tablet (81 mg total) by mouth at bedtime. 11/12/12   Hosie Poisson, MD  atorvastatin (LIPITOR) 10 MG tablet Take 10 mg by mouth daily.    [provider]  B Complex-C (B-COMPLEX WITH VITAMIN C) tablet Take 1 tablet by mouth daily.    [provider]    Blood Glucose Monitoring Suppl (ACCU-CHEK NANO SMARTVIEW) W/DEVICE KIT Use to check blood sugar 3 times per day dx code E11.65 07/19/14   Elayne Snare, MD  carbidopa-levodopa (SINEMET IR) 25-100 MG per tablet Take 1 tablet by mouth 3 (three) times daily.  02/11/13   [provider]  carvedilol (COREG) 25 MG tablet Take 1 tablet (25 mg total) by mouth 2 (two) times daily. 06/11/16 09/13/21  Leonie Man, MD  Cholecalciferol (VITAMIN D-3) 1000 units CAPS Take 1,000 Units by mouth daily.    [provider]  cholestyramine (QUESTRAN) 4 G packet Take 4 g by mouth 2 (two) times daily as needed (for diarrhea).     [provider]  furosemide (LASIX) 40 MG tablet Take 1 tablet (40 mg total) by mouth daily. 03/05/17   Waynetta Pean, PA-C  hydrALAZINE (APRESOLINE) 25 MG tablet TAKE 1 TABLET BY MOUTH TWICE DAILY 05/30/17   Leonie Man, MD  insulin aspart (NOVOLOG FLEXPEN) 100 UNIT/ML FlexPen Inject4- 6 units before breakfast and lunch and 10-12 units before dinner Patient taking differently: Inject 6-10 Units into the skin See admin instructions. 6 units before breakfast then 6 units before lunch then 10 units before dinner (evening meal) 04/17/16   Elayne Snare, MD  Insulin Glargine (TOUJEO SOLOSTAR) 300 UNIT/ML SOPN Inject 40 Units into the skin daily. Patient taking differently: Inject 36 Units into the skin at bedtime.  04/17/16   Elayne Snare, MD  Insulin Pen Needle 31G X 8 MM MISC Use to inject medication 4 times daily 12/22/15   Elayne Snare, MD  isosorbide mononitrate (IMDUR) 30 MG 24 hr tablet TAKE 1 TABLET (30 MG TOTAL) BY MOUTH DAILY. 07/16/16   Leonie Man, MD  JARDIANCE 10 MG TABS tablet TAKE ONE TABLET BY MOUTH ONCE DAILY WITH  BREAKFAST Patient taking differently: Take 10 mg by mouth once a day with breakfast 08/14/16   Elayne Snare, MD  lamoTRIgine (LAMICTAL) 100 MG tablet Take 100 mg by mouth daily.    [provider]  Menthol, Topical Analgesic, 3.5 %  GEL Apply 1 application topically as needed (for neuropathy in feet).     [provider]  metolazone (ZAROXOLYN) 2.5 MG tablet Take 1 tablet (2.5 mg total) by mouth once a week. Patient taking differently: Take 2.5 mg by mouth every Saturday.  01/29/17 04/29/17  Barrett, Evelene Croon, PA-C  Multiple Vitamins-Minerals (MULTIVITAMIN WITH MINERALS) tablet Take 1 tablet by mouth daily.    [provider]  naproxen (NAPROSYN) 500 MG tablet Take 500 mg by mouth 2 (two) times daily as needed for pain. 02/15/17   [provider]  olmesartan (BENICAR) 40 MG tablet Take 40 mg by mouth daily.    [provider]  Pancrelipase, Lip-Prot-Amyl, 25000 UNITS CPEP Take 25,000-50,000 Units by mouth See admin instructions. 50,000 units with breakfast then 25,000 units with lunch then 50,000 units with dinner (evening meal)    [provider]  potassium chloride SA (K-DUR,KLOR-CON) 20 MEQ tablet Take 2 tablets (40 mEq total) by mouth daily. On the day you take metolazone, take an extra 20 meq (60 meq total) Patient taking differently: Take 40 mEq by mouth daily.  02/01/17   Almyra Deforest, PA  pregabalin (LYRICA) 100 MG capsule Take 100 mg by mouth 2 (two) times daily.    [provider]  sertraline (ZOLOFT) 50 MG tablet Take 50 mg by mouth every evening.     [provider]  VICTOZA 18 MG/3ML SOPN INJECT 1.8MG SUBCUTANEOUSLY DAILY (NEEDS APPOINTMENT FOR REFILLS) Patient taking differently: Inject 1.8 mg into the skin daily at lunchtime 10/05/16   Elayne Snare, MD    Family History Family History  Problem Relation Age of Onset  . Allergies Father   . Heart disease Father        before 62  . Heart failure Father   . Hypertension Father   . Hyperlipidemia Father   . Heart disease Mother   . Diabetes Mother   . Hypertension Mother   . Hyperlipidemia  Mother   . Hypertension Sister   . Heart disease Sister        before 20  . Hyperlipidemia Sister   . Diabetes  Brother   . Hypertension Brother   . Diabetes Sister   . Hypertension Sister   . Diabetes Son   . Hypertension Son   . Cancer Other     Social History Social History   Tobacco Use  . Smoking status: Former Smoker    Packs/day: 0.50    Years: 25.00    Pack years: 12.50    Types: Cigarettes  . Smokeless tobacco: Never Used  . Tobacco comment: quit smoking 20+ytrs ago  Substance Use Topics  . Alcohol use: No  . Drug use: No     Allergies   Penicillins; Sulfa antibiotics; Codeine; and Other   Review of Systems Review of Systems  Eyes: Positive for visual disturbance.  Neurological: Positive for dizziness, weakness and headaches.  All other systems reviewed and are negative.    Physical Exam Updated Vital Signs BP (!) 170/89 (BP Location: Left Arm)   Pulse 61   Temp 97.8 F (36.6 C) (Oral)   Resp (!) 22   Wt 130.3 kg (287 lb 4.2 oz)   SpO2 96%   BMI 54.28 kg/m   Physical Exam  Constitutional: She is oriented to person, place, and time. She appears well-developed and well-nourished. No distress.  HENT:  Head: Normocephalic and atraumatic.  Right Ear: Hearing normal.  Left Ear: Hearing normal.  Nose: Nose normal.  Mouth/Throat: Oropharynx is clear and moist and mucous membranes are normal.  Eyes: Conjunctivae and EOM are normal. Pupils are equal, round, and reactive to light.  Neck: Normal range of motion. Neck supple.  Cardiovascular: Regular rhythm, S1 normal and S2 normal. Exam reveals no gallop and no friction rub.  No murmur heard. Pulmonary/Chest: Effort normal and breath sounds normal. No respiratory distress. She exhibits no tenderness.  Abdominal: Soft. Normal appearance and bowel sounds are normal. There is no hepatosplenomegaly. There is no tenderness. There is no rebound, no guarding, no tenderness at McBurney's point and negative Murphy's sign. No hernia.  Musculoskeletal: Normal range of motion.  Neurological: She is alert and oriented to  person, place, and time. A sensory deficit is present. No cranial nerve deficit. She exhibits abnormal muscle tone. Coordination normal. GCS eye subscore is 4. GCS verbal subscore is 5. GCS motor subscore is 6.  Weakness of left upper and left lower extremity compared to right, difficulty raising left arm off of bed against gravity, cannot raise left leg against resistance  Skin: Skin is warm, dry and intact. No rash noted. No cyanosis.  Psychiatric: She has a normal mood and affect. Her speech is normal and behavior is normal. Thought content normal.  Nursing note and vitals reviewed.    ED Treatments / Results  Labs (all labs ordered are listed, but only abnormal results are displayed) Labs Reviewed  APTT - Abnormal; Notable for the following components:      Result Value   aPTT 40 (*)    All other components within normal limits  CBC - Abnormal; Notable for the following components:   MCV 75.2 (*)    MCH 24.4 (*)    RDW 17.0 (*)    All other components within normal limits  COMPREHENSIVE METABOLIC PANEL - Abnormal; Notable for the following components:   Glucose, Bld 107 (*)    Creatinine, Ser 1.04 (*)    GFR calc  non Af Amer 54 (*)    All other components within normal limits  I-STAT CHEM 8, ED - Abnormal; Notable for the following components:   Glucose, Bld 110 (*)    Calcium, Ion 1.00 (*)    All other components within normal limits  PROTIME-INR  DIFFERENTIAL  I-STAT TROPONIN, ED  CBG MONITORING, ED    EKG  EKG Interpretation  Date/Time:  Saturday June 15 2017 05:09:00 EST Ventricular Rate:  60 PR Interval:    QRS Duration: 103 QT Interval:  460 QTC Calculation: 460 R Axis:   101 Text Interpretation:  Sinus rhythm Right axis deviation Nonspecific T abnrm, anterolateral leads No significant change since last tracing Confirmed by Orpah Greek ([id number]) on 06/15/2017 5:24:45 AM       Radiology Mr Brain Wo Contrast  Result Date: 06/15/2017 CLINICAL  DATA:  Dizziness and blurred vision.  Left-sided weakness. EXAM: MRI HEAD WITHOUT CONTRAST TECHNIQUE: Multiplanar, multiecho pulse sequences of the brain and surrounding structures were obtained without intravenous contrast. COMPARISON:  1. Brain MRI 08/05/2011 2. Head CT 04/29/2012 3. Head CT 06/15/2017 FINDINGS: Brain: There is no acute infarct or acute hemorrhage. No mass lesion, hydrocephalus, dural abnormality or extra-axial collection. Old infarct of the right frontal lobe precentral gyrus corresponds to the hypodensity seen on the CT. Otherwise mild multifocal white matter hyperintensity consistent with chronic small vessel disease. The right parietal infarct is new compared to 08/05/2011. No chronic microhemorrhage or superficial siderosis. Vascular: Major intracranial arterial and venous sinus flow voids are preserved. Skull and upper cervical spine: The visualized skull base, calvarium, upper cervical spine and extracranial soft tissues are normal. Sinuses/Orbits: No fluid levels or advanced mucosal thickening. No mastoid or middle ear effusion. Normal orbits. IMPRESSION: 1. No acute ischemia. 2. 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 Electronically Signed   By: Ulyses Jarred M.D.   On: 06/15/2017 05:09   Ct Head Code Stroke Wo Contrast  Result Date: 06/15/2017 CLINICAL DATA:  Code stroke.  Left-sided weakness EXAM: CT HEAD WITHOUT CONTRAST TECHNIQUE: Contiguous axial images were obtained from the base of the skull through the vertex without intravenous contrast. COMPARISON:  Head CT 04/29/2012 FINDINGS: Brain: No mass lesion or acute hemorrhage. Faint focus of hypoattenuation fall within the right precentral gyrus, at the site of the hand motor area. Otherwise no focal parenchymal density abnormality. No hydrocephalus or age advanced atrophy. Incidentally noted partially empty sella. Vascular: No hyperdense vessel. No advanced atherosclerotic  calcification of the arteries at the skull base. Skull: Normal visualized skull base, calvarium and extracranial soft tissues. Sinuses/Orbits: No sinus fluid levels or advanced mucosal thickening. No mastoid effusion. Normal orbits. ASPECTS [facility] Stroke Program Early CT Score) - Ganglionic level infarction (caudate, lentiform nuclei, internal capsule, insula, M1-M3 cortex): 6 - Supraganglionic infarction (M4-M6 cortex): 3 Total score (0-10 with 10 being normal): 10 IMPRESSION: 1. No acute hemorrhage or mass lesion. 2. Mild hypoattenuation within the right precentral gyrus is age indeterminate and could be an early finding of ischemia. 3. ASPECTS is 9. These results were communicated to Dr. Karena Addison Aroor at 4:24 am on 06/15/2017 by text page via the [facility] messaging system. Electronically Signed   By: Ulyses Jarred M.D.   On: 06/15/2017 04:24    Procedures Procedures (including critical care time)  Medications Ordered in ED Medications - No data to display   Initial Impression / Assessment and Plan / ED Course  I have reviewed the  triage vital signs and the nursing notes.  Pertinent labs & imaging results that were available during my care of the patient were reviewed by me and considered in my medical decision making (see chart for details).     Patient arrived to the ER by EMS as a code stroke.  She was experiencing headache and had obvious left-sided deficit upon arrival.  She was brought to radiology for immediate head CT.  No acute abnormality was noted.  Dr. Lorraine Lax ordered an MRI that does not show an acute stroke either.  At this point Dr. Lorraine Lax feels that the patient likely had a seizure and is experiencing Todd's paralysis.  During the period of evaluation here in the ER, her left-sided deficit is rapidly improving.  Dr. Lorraine Lax recommends observation and will make neurologic recommendations.  Final Clinical Impressions(s) / ED Diagnoses   Final diagnoses:  Seizure [facility])  Todd's  paralysis [facility])    ED Discharge Orders    None       Delaine Canter, Gwenyth Allegra, MD 06/15/17 [id number]

## 2017-06-15 NOTE — Consult Note (Signed)
Requesting Physician: Dr. Betsey Holiday    Chief Complaint: left side weakness, blurring vision   History obtained from: Patient and Chart    HPI:                                                                                                                                       [name] is an 71 y.o. female African-American with multiple medical problems including seizures, CHF, migraine, depression, tremors, obstructive sleep apnea, diabetes, hypertension who presents with left-sided weakness and blurring vision on waking up around 2.30 AM this morning. She presents as a stroke alert.  The patient went to sleep around 10 PM last night. On waking up she felt tired and got up to use the bathroom. She felt her gait was off balance and while she was returning to bed she felt her vision was blurring in her left leg and left arm was weak. She called 911 and on arrival EMS noted a left-sided weakness. No facial droop was noted. Her blood pressure was 253 systolic  On arrival to [facility], she was noted to be slightly drowsy, and had a weakness on left side as well as reduced sensation of the left side.  Patient also complaining of severe headache in the emergency room.CT head was negative and a stat MRI brain showed no acute stroke despite her having symptoms. She did have an  area of encephalomalacia in the right cortex noted on CT as well as MRI brain. This appears to be a new finding compared to last CT done in 2013.   She has a history of seizures and she is on lamotrigine.Regarding her seizure history, it is unclear what type of seizure she has had. On reviewing neurology notes, there is no formal evaluation for seizure. Also she is on lamotrigine 150 mg once daily which is unusual dose for seizures. Also primidone as listed for seizures, but usually medicine used for tremors. No EEG is available in her records.   Past Medical History:  Diagnosis Date  . Abnormal liver function     in the past.   . Anemia   . Arthritis    all over  . Bruises easily   . Cataract    right eye;immature  . CHF (congestive heart failure) ([place])   . Chronic back pain    stenosis  . Chronic cough   . Demand myocardial infarction [facility]) 2012   Demand Infarction in setting of Pancreatitis --> mild Troponin elevation, Non-obstructive CAD  . Depression    takes Abilify daily as well as Zoloft  . Diastolic heart failure 6644   Grade 1 diastolic Dysfunction by Echo   . Diverticulosis   . DM (diabetes mellitus) ([place])    takes Victoza daily as well as Lantus and Humalog  . Empty sella ([place])    on MRI in 2009.  . Glaucoma   . Headache(784.0)  last migraine-4-25yrs ago  . History of blood transfusion    no abnormal reaction noted  . History of colon polyps    benign  . History of influenza    antibiotic given yesterday along with taking Delsym cough syrup  . HTN (hypertension)    takes [employer] daily  . Hyperlipidemia    takes Lipitor daily  . Joint swelling   . Nocturia   . Obstructive sleep apnea   . Pancreatitis    takes Pancrelipase daily  . Parkinson's disease ([place])    takes Sinemet daily  . Peripheral edema    takes Lasix daily  . Peripheral neuropathy   . Pneumonia 2012  . Seizure disorder ([place])   . Seizures ([place])    takes Lamictal daily and Primidone nightly;last seizure 2wks ago  . Urinary frequency   . Urinary urgency   . Varicose veins of both lower extremities with pain     Past Surgical History:  Procedure Laterality Date  . ABDOMINAL HYSTERECTOMY    . APPENDECTOMY    . BACK SURGERY    . CARDIAC CATHETERIZATION  2009/March 2012   2009: (Dr. Alvie Heidelberg) Nonobstructive CAD; 2012: Minimal CAD --> false positive stress test  . Cardiac Event Monitor  September-October 2017   Sinus rhythm with occasional PACs and artifact. No arrhythmias besides one short run of tachycardia.  . CHOLECYSTECTOMY    . COLONOSCOPY N/A 08/15/2012   Procedure: COLONOSCOPY;   Surgeon: Arta Silence, MD;  Location: [facility];  Service: Endoscopy;  Laterality: N/A;  . ESOPHAGOGASTRODUODENOSCOPY    . eye cysts Bilateral   . LASIK    . NM MYOVIEW LTD  March 2012; Aug 2017   Probably normal Lexiscan Myoview. Mild reversible Anterior Defect ~ Breast Attenuation. (CRO ischemia):  FALSE POSITIVE by Cath; b) 8/'17: LOW RISK, No ischemia or Infarction.  Breast Attenuation not noted.  Marland Kitchen NM MYOVIEW LTD  01/2016   LOW RISK. No ischemia or infarction.  Marland Kitchen RECTAL POLYPECTOMY    . TONSILLECTOMY    . TRANSTHORACIC ECHOCARDIOGRAM  01/2016   Normal LV chamber size with moderate LVH pattern. EF 50-55%. Severe LA dilation. Moderate RA dilation. PA pressure elevated at 38 mmHg (mild as (  . TUBAL LIGATION    . vaginal cyst removed     several times    Family History  Problem Relation Age of Onset  . Allergies Father   . Heart disease Father        before 37  . Heart failure Father   . Hypertension Father   . Hyperlipidemia Father   . Heart disease Mother   . Diabetes Mother   . Hypertension Mother   . Hyperlipidemia Mother   . Hypertension Sister   . Heart disease Sister        before 80  . Hyperlipidemia Sister   . Diabetes Brother   . Hypertension Brother   . Diabetes Sister   . Hypertension Sister   . Diabetes Son   . Hypertension Son   . Cancer Other    Social History:  reports that she has quit smoking. Her smoking use included cigarettes. She has a 12.50 pack-year smoking history. she has never used smokeless tobacco. She reports that she does not drink alcohol or use drugs.  Allergies:  Allergies  Allergen Reactions  . Penicillins Hives    Has patient had a PCN reaction causing immediate rash, facial/tongue/throat swelling, SOB or lightheadedness with hypotension: Yes Has patient had a  PCN reaction causing severe rash involving mucus membranes or skin necrosis: No Has patient had a PCN reaction that required hospitalization: No Has patient had a PCN  reaction occurring within the last 10 years: No If all of the above answers are "NO", then may proceed with Cephalosporin use.   . Sulfa Antibiotics Hives  . Codeine     Headache and makes the patient feel "off"  . Other Rash    Bleach    Medications:                                                                                                                        I reviewed home medications  ROS:                                                                                                                                     14 systems reviewed and negative except above    Examination:                                                                                                      General: Appears well-developed and well-nourished.  Psych: Affect appropriate to situation Eyes: No scleral injection HENT: No OP obstrucion Head: Normocephalic.  Cardiovascular: Normal rate and regular rhythm.  Respiratory: Effort normal and breath sounds normal to anterior ascultation GI: Soft.  No distension. There is no tenderness.  Skin: WDI   Neurological Examination Mental Status: Alert, oriented, thought content appropriate.  Speech fluent without evidence of aphasia. Able to follow 3 step commands without difficulty. Cranial Nerves: II: Visual fields grossly normal,  III,IV, VI: ptosis not present, extra-ocular motions intact bilaterally, pupils equal, round, reactive to light and accommodation V,VII: smile symmetric, facial light touch sensation normal bilaterally VIII: hearing normal bilaterally IX,X: uvula rises symmetrically XI: bilateral shoulder shrug XII: midline tongue extension Motor: Right : Upper extremity   5/5    Left:     Upper extremity   4/5  Lower extremity   5/5     Lower extremity   4/5 Tone and bulk:normal tone throughout; no atrophy noted Sensory: Reduced sensation on the left arm and leg Deep Tendon Reflexes: 2+ and symmetric  throughout Plantars: Right: downgoing   Left: downgoing Cerebellar: normal finger-to-nose, normal rapid alternating movements and normal heel-to-shin test Gait: normal gait and station     Lab Results: Basic Metabolic Panel: Recent Labs  Lab 06/15/17 0355 06/15/17 0411  NA 137 140  K 4.1 4.0  CL 104 103  CO2 23  --   GLUCOSE 107* 110*  BUN 16 19  CREATININE 1.04* 1.00  CALCIUM 8.9  --     CBC: Recent Labs  Lab 06/15/17 0355 06/15/17 0411  WBC 5.5  --   NEUTROABS 2.7  --   HGB 12.3 13.9  HCT 37.9 41.0  MCV 75.2*  --   PLT 259  --     Coagulation Studies: Recent Labs    06/15/17 0355  LABPROT 14.7  INR 1.16    Imaging: Mr Brain Wo Contrast  Result Date: 06/15/2017 CLINICAL DATA:  Dizziness and blurred vision.  Left-sided weakness. EXAM: MRI HEAD WITHOUT CONTRAST TECHNIQUE: Multiplanar, multiecho pulse sequences of the brain and surrounding structures were obtained without intravenous contrast. COMPARISON:  1. Brain MRI 08/05/2011 2. Head CT 04/29/2012 3. Head CT 06/15/2017 FINDINGS: Brain: There is no acute infarct or acute hemorrhage. No mass lesion, hydrocephalus, dural abnormality or extra-axial collection. Old infarct of the right frontal lobe precentral gyrus corresponds to the hypodensity seen on the CT. Otherwise mild multifocal white matter hyperintensity consistent with chronic small vessel disease. The right parietal infarct is new compared to 08/05/2011. No chronic microhemorrhage or superficial siderosis. Vascular: Major intracranial arterial and venous sinus flow voids are preserved. Skull and upper cervical spine: The visualized skull base, calvarium, upper cervical spine and extracranial soft tissues are normal. Sinuses/Orbits: No fluid levels or advanced mucosal thickening. No mastoid or middle ear effusion. Normal orbits. IMPRESSION: 1. No acute ischemia. 2. 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  Electronically Signed   By: Ulyses Jarred M.D.   On: 06/15/2017 05:09   Ct Head Code Stroke Wo Contrast  Result Date: 06/15/2017 CLINICAL DATA:  Code stroke.  Left-sided weakness EXAM: CT HEAD WITHOUT CONTRAST TECHNIQUE: Contiguous axial images were obtained from the base of the skull through the vertex without intravenous contrast. COMPARISON:  Head CT 04/29/2012 FINDINGS: Brain: No mass lesion or acute hemorrhage. Faint focus of hypoattenuation fall within the right precentral gyrus, at the site of the hand motor area. Otherwise no focal parenchymal density abnormality. No hydrocephalus or age advanced atrophy. Incidentally noted partially empty sella. Vascular: No hyperdense vessel. No advanced atherosclerotic calcification of the arteries at the skull base. Skull: Normal visualized skull base, calvarium and extracranial soft tissues. Sinuses/Orbits: No sinus fluid levels or advanced mucosal thickening. No mastoid effusion. Normal orbits. ASPECTS [facility] Stroke Program Early CT Score) - Ganglionic level infarction (caudate, lentiform nuclei, internal capsule, insula, M1-M3 cortex): 6 - Supraganglionic infarction (M4-M6 cortex): 3 Total score (0-10 with 10 being normal): 10 IMPRESSION: 1. No acute hemorrhage or mass lesion. 2. Mild hypoattenuation within the right precentral gyrus is age indeterminate and could be an early finding of ischemia. 3. ASPECTS is 9. These results were communicated to Dr. Karena Addison Teeghan Hammer at 4:24 am on 06/15/2017 by text page via the [facility] messaging system. Electronically Signed   By: Lennette Bihari  [place] M.D.   On: 06/15/2017 04:24     ASSESSMENT AND PLAN  71 y.o. female African-American with multiple medical problems including seizures, CHF, migraine, depression, tremors, obstructive sleep apnea, diabetes, hypertension who presents with left-sided weakness and blurring vision on waking up around 2.30 AM this morning. She presents as a stroke alert. MRI brain is negative for any acute  stroke hours shows area of his range over the right precentral gyrus. Stroke alert was therefore cancelled. This could well be a focus for seizures and could explain left-sided weakness if she had suffered from a seizure in her sleep and was experiencing postictal Todd's. She also is complaining of severe headache which can be seen following the seizure. Another hypothesis is recrudescence of her old stroke symptoms.  Possible seizure with postictal Todd's paresis and postictal headache vs grimaces a little stroke symptoms  Admit for observation Obtain lamotrigine levels, consider changing dose to 100mg  BID  Load with keppra 1g if she has another seizure Check UA, chest x-ray, CBC and electrolytes Routine EEG Seizure precautions including instructions not to drive x 6 months   Right cortical encephalomalacia - likely old infarct  Etiology of her old stroke noted on CT and MRI unclear -  she does not have a diagnosis of stroke and  it does not appear to have been worked up. Carotid ultrasound,  Telemetry,  Echocardiogram  Arpan Eskelson [facility] Pager Number [phone]

## 2017-06-15 NOTE — Procedures (Signed)
ELECTROENCEPHALOGRAM REPORT  Date of Study: 06/15/17  Patient's Name: [name] MRN: [id number] Date of Birth: [birth year]  Referring Provider: Kerney Elbe, MD  Clinical History: [name] is a 71 y.o. female African-American with multiple medical problems including seizures, CHF, migraine, depression, tremors, obstructive sleep apnea, diabetes, hypertension who presented as stroke alert with left-sided weakness and blurring vision on waking. On arrival to [facility], she was noted to be slightly drowsy, and had a weakness on left side as well as reduced sensation of the left side.  Patient also complaining of severe headache in the emergency room.CT head was negative and a stat MRI brain showed no acute stroke despite her having symptoms. She did have an  area of encephalomalacia in the right cortex noted on CT as well as MRI brain. This appears to be a new finding compared to last CT done in 2013. She has a history of seizures and she is on lamotrigine.   Medications: Scheduled Meds: .  stroke: mapping our early stages of recovery book   Does not apply Once  . atorvastatin  10 mg Oral QPM  . carbidopa-levodopa  1 tablet Oral TID  . carvedilol  25 mg Oral BID  . furosemide  40 mg Oral BID  . insulin aspart  0-15 Units Subcutaneous TID WC  . insulin glargine  36 Units Subcutaneous QHS  . irbesartan  300 mg Oral Daily  . isosorbide mononitrate  30 mg Oral Daily  . lamoTRIgine  100 mg Oral BID  . lipase/protease/amylase  24,000 Units Oral Q lunch  . lipase/protease/amylase  48,000 Units Oral 2 times per day  . metolazone  2.5 mg Oral Q Sat  . potassium chloride SA  40 mEq Oral Daily  . pregabalin  100 mg Oral BID  . sertraline  50 mg Oral QPM   Continuous Infusions: PRN Meds:.acetaminophen **OR** acetaminophen (TYLENOL) oral liquid 160 mg/5 mL **OR** acetaminophen, LORazepam            Technical Summary: This is a standard 16 channel EEG recording performed  according to the international 10-20 electrode system.  AP bipolar, transverse bipolar, and referential montages were obtained, and digitally reformatted as necessary.  Duration of Tracing: 28:25  Description: In the awake state there is a posterior predominant 8 Hz alpha rhythm seen from the posterior head regions in a symmetric fashion which attenuates with eye opening.  Background beta rhythm and eye blink artifact noted.  Drowsiness is noted by dropout of background alpha and vertex slowing, however no definite stage II sleep was noted.  Photic stimulation was performed without notable changes.  HV was not performed.  EKG was monitored and noted to be sinus rhythym with an average heart rate of 66 bpm.  Impression: This is a normal EEG in the awake and drowsy states without any epileptiform abnormalities noted.  A single normal EEG does not exclude the diagnosis of epilepsy.  Clinical correlation advised.   Carvel Getting, M.D. Neurology Cell [phone]

## 2017-06-15 NOTE — ED Notes (Signed)
Pt arrived in MRI.

## 2017-06-15 NOTE — ED Notes (Signed)
Unable to collect blood at this time, EEG being done.

## 2017-06-15 NOTE — ED Notes (Signed)
Pt returned from MRI °

## 2017-06-15 NOTE — Evaluation (Signed)
Physical Therapy Evaluation Patient Details Name: [name] MRN: [id number] DOB: 20-Jul-1945 Today's Date: 06/15/2017   History of Present Illness  Pt is a 71 y/o female admitted secondary to blurred vision and left sided weakness. MRI was negative for any acute findings. PMH including but not limited to HTN, HLD, CAD, chronic diastolic CHF, CVA, DM type II and seizure disorder.    Clinical Impression  Pt presented supine in bed with HOB elevated, initially asleep but easily aroused and willing to participate in therapy session. Prior to admission, pt reported that she ambulates with use of SPC and is independent with ADLs. Pt lives with her sister and two sons. She stated that someone is always at home and available to assist if needed. Pt currently limited secondary to fatigue but was able to ambulate a short distance in her room with 1HHA and min A for stability. Pt would continue to benefit from skilled physical therapy services at this time while admitted and after d/c to address the below listed limitations in order to improve overall safety and independence with functional mobility.  Of note, pt on RA during evaluation with one desat to 88% after ambulation and unable to return to >90% until 2L of supplemental O2 was reapplied.     Follow Up Recommendations Home health PT;Supervision/Assistance - 24 hour;Other (comment)(pt refusing SNF)    Equipment Recommendations  None recommended by PT;Other (comment)(pt refusing RW)    Recommendations for Other Services       Precautions / Restrictions Precautions Precautions: Fall Precaution Comments: watch SPO2 Restrictions Weight Bearing Restrictions: No      Mobility  Bed Mobility Overal bed mobility: Needs Assistance Bed Mobility: Supine to Sit;Sit to Supine     Supine to sit: Min guard Sit to supine: Min guard   General bed mobility comments: increased time and effort, min guard for safety  Transfers Overall transfer  level: Needs assistance Equipment used: 2 person hand held assist Transfers: Sit to/from Stand Sit to Stand: Min assist;+2 safety/equipment         General transfer comment: increased time and effort, min A x2 for safety and stability with transitional movement  Ambulation/Gait Ambulation/Gait assistance: Min assist Ambulation Distance (Feet): 20 Feet Assistive device: 1 person hand held assist Gait Pattern/deviations: Step-to pattern;Step-through pattern;Decreased step length - right;Decreased step length - left;Decreased stride length;Shuffle Gait velocity: decreased Gait velocity interpretation: Below normal speed for age/gender General Gait Details: modest instability requiring min A with 1HHA, pt reported feeling light-headed upon initially standing up but improved with time  Stairs            Wheelchair Mobility    Modified Rankin (Stroke Patients Only)       Balance Overall balance assessment: Needs assistance Sitting-balance support: Feet supported Sitting balance-Leahy Scale: Fair     Standing balance support: During functional activity;Single extremity supported Standing balance-Leahy Scale: Poor Standing balance comment: reliant on at least one UE support                             Pertinent Vitals/Pain Pain Assessment: Faces Faces Pain Scale: Hurts little more Pain Location: headache Pain Descriptors / Indicators: Headache Pain Intervention(s): Monitored during session;Repositioned    Home Living Family/patient expects to be discharged to:: Private residence Living Arrangements: Children;Other relatives Available Help at Discharge: Family;Available 24 hours/day Type of Home: House Home Access: Stairs to enter Entrance Stairs-Rails: Right;Left Entrance Stairs-Number of Steps: 2  Home Layout: One level Home Equipment: Cane - single point      Prior Function Level of Independence: Independent with assistive device(s)          Comments: ambulates with use of SPC, refuses a RW     Hand Dominance   Dominant Hand: Right    Extremity/Trunk Assessment   Upper Extremity Assessment Upper Extremity Assessment: Generalized weakness    Lower Extremity Assessment Lower Extremity Assessment: Generalized weakness(edema noted in bilateral ankles)       Communication   Communication: No difficulties  Cognition Arousal/Alertness: Awake/alert Behavior During Therapy: WFL for tasks assessed/performed Overall Cognitive Status: Within Functional Limits for tasks assessed                                 General Comments: cognition not formally assessed but [facility] for general conversation      General Comments      Exercises     Assessment/Plan    PT Assessment Patient needs continued PT services  PT Problem List Decreased strength;Decreased activity tolerance;Decreased balance;Decreased mobility;Decreased coordination;Decreased safety awareness;Cardiopulmonary status limiting activity       PT Treatment Interventions Gait training;DME instruction;Stair training;Functional mobility training;Balance training;Therapeutic activities;Therapeutic exercise;Neuromuscular re-education;Patient/family education    PT Goals (Current goals can be found in the Care Plan section)  Acute Rehab PT Goals Patient Stated Goal: return home PT Goal Formulation: With patient Time For Goal Achievement: 06/29/17 Potential to Achieve Goals: Good    Frequency Min 3X/week   Barriers to discharge        Co-evaluation               AM-PAC PT "6 Clicks" Daily Activity  Outcome Measure Difficulty turning over in bed (including adjusting bedclothes, sheets and blankets)?: A Lot Difficulty moving from lying on back to sitting on the side of the bed? : A Lot Difficulty sitting down on and standing up from a chair with arms (e.g., wheelchair, bedside commode, etc,.)?: Unable Help needed moving to and from a bed  to chair (including a wheelchair)?: A Little Help needed walking in hospital room?: A Little Help needed climbing 3-5 steps with a railing? : A Lot 6 Click Score: 13    End of Session Equipment Utilized During Treatment: Gait belt Activity Tolerance: Patient limited by fatigue Patient left: in bed;with call bell/phone within reach Nurse Communication: Mobility status PT Visit Diagnosis: Other abnormalities of gait and mobility (R26.89);Muscle weakness (generalized) (M62.81)    Time: 0867-6195 PT Time Calculation (min) (ACUTE ONLY): 25 min   Charges:   PT Evaluation $PT Eval Moderate Complexity: 1 Mod PT Treatments $Gait Training: 8-22 mins   PT G Codes:   PT G-Codes **NOT FOR INPATIENT CLASS** Functional Assessment Tool Used: AM-PAC 6 Clicks Basic Mobility;Clinical judgement Functional Limitation: Mobility: Walking and moving around Mobility: Walking and Moving Around Current Status (K9326): At least 40 percent but less than 60 percent impaired, limited or restricted Mobility: Walking and Moving Around Goal Status [phone]): At least 1 percent but less than 20 percent impaired, limited or restricted    [facility], [place], DPT [place] 06/15/2017, 11:24 AM

## 2017-06-15 NOTE — Code Documentation (Signed)
Responded to Code Stroke called at 0349.  Pt arrived to ED at 0401 c/o L sided weakness and headache.  GRM-3014. CBG-131, BP-145/71.  NIH-3 for drowsiness, L UE drift, and L sensory deficit.  CT head-R MCA infarct-age undetermined.  Pt taken for STAT MRI.  MRI-negative for acute stroke.  Code stroke cancelled. Plan to admit to medical team

## 2017-06-15 NOTE — Progress Notes (Signed)
Discussed case with Dr. Lorraine Lax. We are in consensus that lamotrigine should be changed to BID dosing, which is standard. Will increase total daily dose by 30%. Have changed order to 100 mg BID.   Electronically signed: Dr. Kerney Elbe

## 2017-06-15 NOTE — ED Notes (Signed)
Unable to obtain EKG at this time. Pt taken straight from CT Scan to MRI.

## 2017-06-15 NOTE — ED Triage Notes (Signed)
Pt arrived via EMS from home with c/o HA, LSN 2200, pt awoke @ 0200 with HA, dizziness and blurred vision. L sided weakness

## 2017-06-15 NOTE — Progress Notes (Addendum)
  PROGRESS NOTE  Patient admitted earlier this morning. See H&P. Patient admitted with new left sided paralysis. She presented as code stroke. MRI negative for acute ischemia. Neurology consulted, possibly Todd's paralysis. They have increased lamotrigine dose. Also patient was recently increased on her home lasix dose due to worsening SOB. BNP on admission 290.1, with trace peripheral edema, CXR without acute cardiopulmonary disease. Echo ordered. Continue lasix PO 40mg  BID and strict I/Os, daily weight. Had complaints of BRBPR prior to admission, now stopped and has not had any during this admission. Hgb stable at 13.9. Trend H&H and watch for further bleeding. Patient follows with Dr. Paulita Fujita and can likely follow up as outpatient unless significant bleeding or anemia recurs during hospitalization. Echo and EEG pending.   Addendum: notified by RN this afternoon that patient had a large bloody BM. Repeat H&H with Hgb 10.2. Patient had a colonoscopy back in 2014 with Dr. Paulita Fujita which revealed polyp and diverticulosis. VSS currently. Spoke with Dr. Therisa Doyne, monitor H&H q12h and monitor for further bleed. She will see in consult. Hold aspirin.    Dessa Phi, DO [facility] [email or web address] Password [facility] 06/15/2017, 11:50 AM

## 2017-06-15 NOTE — H&P (Addendum)
History and Physical    [name]:[id number] DOB: 1945/06/30 DOA: 06/15/2017  Referring [name]/NP/PA: Dr. Joseph [name] PCP: [name]  Patient coming from: home via EMS  Chief Complaint: Left sided weakness  I have personally briefly reviewed patient's old medical records in [place]   HPI: [name] is a 71 y.o. female with medical history significant of HTN, HLD, CAD, chronic diastolic CHF, CVA, DM type II, seizure disorder, obesity hypoventilation syndrome; who presents with complaints of waking up at around 2 AM needing to use the restroom.  Upon coming back bathroom patient reports feeling funny with complaints of blurry vision, headache, dizziness, and inability to move her left arm and leg.  She thought initially that her blood pressure or her blood sugar may be low.  Her son took blood pressure and noted to be around 160/80 and blood glucose was 131.  Her last seizure was approximately 2 months ago.  Other associated symptoms include complaints of worsening shortness of breath, leg swelling, weight gain, and orthopnea. She had talked with her cardiologist [name] 2 days ago, and she was recommended to increase her Lasix to 40 mg twice daily for the next 3 days and to call the [name] on Monday.  Yesterday patient also reports having bright red blood per rectum with stools.  No history of hemorrhoids.  Patient was previously seen by [name] of gastroenterology and was told to follow-up with him if symptoms reoccurred.  ED Course: Patient was brought in by EMS as a code stroke seen by [name].  Upon admission into the emergency department patient was seen to be afebrile, pulse 61-64, respirations 16-24, blood pressure 145/69-170/89, and O2 sats 91-98% on 2 L of nasal cannula oxygen. CT scan showed no acute abnormality and a hypodensity in the right precentral gyrus indeterminate age. Labs revealed hemoglobin 12.3, BUN 16, creatinine 1.04,  aPTT 40, PT 14.7, and troponin i 0.01.   On MRI patient was noted to have no acute ischemia and an old right paracentral gyrus.  Symptoms were suspected to possibly be secondary to seizure and [name] called to admit for observation as the patient's symptoms were slowly improving.  Review of Systems  Constitutional: Positive for malaise/fatigue.       Positive for weight gain  HENT: Negative for ear discharge and ear pain.   Eyes: Positive for blurred vision. Negative for photophobia.  Respiratory: Positive for shortness of breath. Negative for hemoptysis.   Cardiovascular: Positive for chest pain, orthopnea and leg swelling.  Gastrointestinal: Positive for blood in stool. Negative for abdominal pain, nausea and vomiting.  Genitourinary: Negative for dysuria and frequency.  Musculoskeletal: Positive for joint pain. Negative for falls.  Neurological: Positive for focal weakness. Negative for sensory change and loss of consciousness.  Endo/Heme/Allergies: Negative for polydipsia. Bruises/bleeds easily.  Psychiatric/Behavioral: Negative for substance abuse and suicidal ideas.    Past Medical History:  Diagnosis Date  . Abnormal liver function     in the past.  . Anemia   . Arthritis    all over  . Bruises easily   . Cataract    right eye;immature  . CHF (congestive heart failure) ([place])   . Chronic back pain    stenosis  . Chronic cough   . Demand myocardial infarction [facility]) 2012   Demand Infarction in setting of Pancreatitis --> mild Troponin elevation, Non-obstructive CAD  . Depression    takes Abilify daily as well as Zoloft  .  Diastolic heart failure 6767   Grade 1 diastolic Dysfunction by Echo   . Diverticulosis   . DM (diabetes mellitus) ([place])    takes Victoza daily as well as Lantus and Humalog  . Empty sella ([place])    on MRI in 2009.  . Glaucoma   . Headache(784.0)    last migraine-4-78yr ago  . History of blood transfusion    no abnormal reaction noted  . History of  colon polyps    benign  . History of influenza    antibiotic given yesterday along with taking Delsym cough syrup  . HTN (hypertension)    takes BKinder Morgan Energydaily  . Hyperlipidemia    takes Lipitor daily  . Joint swelling   . Nocturia   . Obstructive sleep apnea   . Pancreatitis    takes Pancrelipase daily  . [place]'s disease [place]    takes Sinemet daily  . Peripheral edema    takes Lasix daily  . Peripheral neuropathy   . Pneumonia 2012  . [place]   . Seizures ([place]    takes Lamictal daily and Primidone nightly;last seizure 2wks ago  . Urinary frequency   . Urinary urgency   . Varicose veins of both lower extremities with pain     Past Surgical History:  Procedure Laterality Date  . ABDOMINAL HYSTERECTOMY    . APPENDECTOMY    . BACK SURGERY    . CARDIAC CATHETERIZATION  2009/March 2012   2009: (Dr. KAlvie Heidelberg Nonobstructive CAD; 2012: Minimal CAD --> false positive stress test  . Cardiac Event Monitor  September-October 2017   Sinus rhythm with occasional PACs and artifact. No arrhythmias besides one short run of tachycardia.  . CHOLECYSTECTOMY    . COLONOSCOPY N/A 08/15/2012   Procedure: COLONOSCOPY;  Surgeon: WArta Silence [name];  Location: [facility];  Service: Endoscopy;  Laterality: N/A;  . ESOPHAGOGASTRODUODENOSCOPY    . eye cysts Bilateral   . LASIK    . NM MYOVIEW LTD  March 2012; Aug 2017   Probably normal Lexiscan Myoview. Mild reversible Anterior Defect ~ Breast Attenuation. (CRO ischemia):  FALSE POSITIVE by Cath; b) 8/'17: LOW RISK, No ischemia or Infarction.  Breast Attenuation not noted.  .Marland KitchenNM MYOVIEW LTD  01/2016   LOW RISK. No ischemia or infarction.  .Marland KitchenRECTAL POLYPECTOMY    . TONSILLECTOMY    . TRANSTHORACIC ECHOCARDIOGRAM  01/2016   Normal LV chamber size with moderate LVH pattern. EF 50-55%. Severe LA dilation. Moderate RA dilation. PA pressure elevated at 38 mmHg (mild as (  . TUBAL LIGATION    . vaginal cyst  removed     several times     reports that she has quit smoking. Her smoking use included cigarettes. She has a 12.50 pack-year smoking history. she has never used smokeless tobacco. She reports that she does not drink alcohol or use drugs.  Allergies  Allergen Reactions  . Penicillins Hives    Has patient had a PCN reaction causing immediate rash, facial/tongue/throat swelling, SOB or lightheadedness with hypotension: Yes Has patient had a PCN reaction causing severe rash involving mucus membranes or skin necrosis: No Has patient had a PCN reaction that required hospitalization: No Has patient had a PCN reaction occurring within the last 10 years: No If all of the above answers are "NO", then may proceed with Cephalosporin use.   . Sulfa Antibiotics Hives  . Codeine     Headache and makes the patient feel "off"  .  Other Rash    Bleach    Family History  Problem Relation Age of Onset  . Allergies Father   . Heart disease Father        before 26  . Heart failure Father   . Hypertension Father   . Hyperlipidemia Father   . Heart disease Mother   . Diabetes Mother   . Hypertension Mother   . Hyperlipidemia Mother   . Hypertension Sister   . Heart disease Sister        before 68  . Hyperlipidemia Sister   . Diabetes Brother   . Hypertension Brother   . Diabetes Sister   . Hypertension Sister   . Diabetes Son   . Hypertension Son   . Cancer Other     Prior to Admission medications   Medication Sig Start Date End Date Taking? Authorizing Provider  ACCU-CHEK FASTCLIX LANCETS MISC USE  TO CHECK BLOOD SUGAR THREE TIMES DAILY 06/24/15   Elayne Snare, [name]  ACCU-CHEK SMARTVIEW test strip CHECK BLOOD SUGAR THREE TIMES DAILY 04/17/16   Elayne Snare, [name]  acetaminophen (TYLENOL) 500 MG tablet Take 500-1,000 mg by mouth every 4 (four) hours as needed (for pain).     Provider, Historical, [name]  Alcohol Swabs (B-D SINGLE USE SWABS REGULAR) PADS Use as directed 12/22/15   Elayne Snare, [name]    ARIPiprazole (ABILIFY) 10 MG tablet Take 10 mg by mouth daily.    Provider, Historical, [name]  aspirin EC 81 MG tablet Take 1 tablet (81 mg total) by mouth at bedtime. 11/12/12   Hosie Poisson, [name]  atorvastatin (LIPITOR) 10 MG tablet Take 10 mg by mouth daily.    Provider, Historical, [name]  B Complex-C (B-COMPLEX WITH VITAMIN C) tablet Take 1 tablet by mouth daily.    Provider, Historical, [name]  Blood Glucose Monitoring Suppl (ACCU-CHEK NANO SMARTVIEW) W/DEVICE KIT Use to check blood sugar 3 times per day dx code E11.65 07/19/14   Elayne Snare, [name]  carbidopa-levodopa (SINEMET IR) 25-100 MG per tablet Take 1 tablet by mouth 3 (three) times daily.  02/11/13   Provider, Historical, [name]  carvedilol (COREG) 25 MG tablet Take 1 tablet (25 mg total) by mouth 2 (two) times daily. 06/11/16 09/13/21  Leonie Man, [name]  Cholecalciferol (VITAMIN D-3) 1000 units CAPS Take 1,000 Units by mouth daily.    Provider, Historical, [name]  cholestyramine Lucrezia Starch) 4 G packet Take 4 g by mouth 2 (two) times daily as needed (for diarrhea).     Provider, Historical, [name]  furosemide (LASIX) 40 MG tablet Take 1 tablet (40 mg total) by mouth daily. 03/05/17   Waynetta Pean, PA-C  hydrALAZINE (APRESOLINE) 25 MG tablet TAKE 1 TABLET BY MOUTH TWICE DAILY 05/30/17   Leonie Man, [name]  insulin aspart (NOVOLOG FLEXPEN) 100 UNIT/ML FlexPen Inject4- 6 units before breakfast and lunch and 10-12 units before dinner Patient taking differently: Inject 6-10 Units into the skin See admin instructions. 6 units before breakfast then 6 units before lunch then 10 units before dinner (evening meal) 04/17/16   Elayne Snare, [name]  Insulin Glargine (TOUJEO SOLOSTAR) 300 UNIT/ML SOPN Inject 40 Units into the skin daily. Patient taking differently: Inject 36 Units into the skin at bedtime.  04/17/16   Elayne Snare, [name]  Insulin Pen Needle 31G X 8 MM MISC Use to inject medication 4 times daily 12/22/15   Elayne Snare, [name]  isosorbide mononitrate (IMDUR) 30 MG 24 hr  tablet TAKE 1 TABLET (30 MG TOTAL) BY  MOUTH DAILY. 07/16/16   Leonie Man, [name]  JARDIANCE 10 MG TABS tablet TAKE ONE TABLET BY MOUTH ONCE DAILY WITH  BREAKFAST Patient taking differently: Take 10 mg by mouth once a day with breakfast 08/14/16   Elayne Snare, [name]  lamoTRIgine (LAMICTAL) 100 MG tablet Take 100 mg by mouth daily.    Provider, Historical, [name]  Menthol, Topical Analgesic, 3.5 % GEL Apply 1 application topically as needed (for neuropathy in feet).     Provider, Historical, [name]  metolazone (ZAROXOLYN) 2.5 MG tablet Take 1 tablet (2.5 mg total) by mouth once a week. Patient taking differently: Take 2.5 mg by mouth every Saturday.  01/29/17 04/29/17  Barrett, Evelene Croon, PA-C  Multiple Vitamins-Minerals (MULTIVITAMIN WITH MINERALS) tablet Take 1 tablet by mouth daily.    Provider, Historical, [name]  naproxen (NAPROSYN) 500 MG tablet Take 500 mg by mouth 2 (two) times daily as needed for pain. 02/15/17   Provider, Historical, [name]  olmesartan (BENICAR) 40 MG tablet Take 40 mg by mouth daily.    Provider, Historical, [name]  Pancrelipase, Lip-Prot-Amyl, 25000 UNITS CPEP Take 25,000-50,000 Units by mouth See admin instructions. 50,000 units with breakfast then 25,000 units with lunch then 50,000 units with dinner (evening meal)    Provider, Historical, [name]  potassium chloride SA (K-DUR,KLOR-CON) 20 MEQ tablet Take 2 tablets (40 mEq total) by mouth daily. On the day you take metolazone, take an extra 20 meq (60 meq total) Patient taking differently: Take 40 mEq by mouth daily.  02/01/17   Almyra Deforest, PA  pregabalin (LYRICA) 100 MG capsule Take 100 mg by mouth 2 (two) times daily.    Provider, Historical, [name]  sertraline (ZOLOFT) 50 MG tablet Take 50 mg by mouth every evening.     Provider, Historical, [name]  VICTOZA 18 MG/3ML SOPN INJECT 1.8MG SUBCUTANEOUSLY DAILY (NEEDS APPOINTMENT FOR REFILLS) Patient taking differently: Inject 1.8 mg into the skin daily at lunchtime 10/05/16   Elayne Snare, [name]    Physical  Exam:  Constitutional: Obese female who appears to be in some distress Vitals:   06/15/17 0422 06/15/17 0433 06/15/17 0502 06/15/17 0515  BP: (!) 169/78  (!) 170/89 (!) 145/69  Pulse: 64  61 61  Resp: (!) 24  (!) 22 16  Temp:   97.8 F (36.6 C)   TempSrc:   Oral   SpO2: 91%  96% 98%  Weight:  130.3 kg (287 lb 4.2 oz)     Eyes: PERRL, lids and conjunctivae normal ENMT: Mucous membranes are dry. Posterior pharynx clear of any exudate or lesions.  Neck: normal, supple, no masses, no thyromegaly Respiratory: Normal respiratory rate on oxygen.  Crackles heard in the lower lung fields. Cardiovascular: Regular rate and rhythm, no murmurs / rubs / gallops.  2+ pitting lower extremity edema. 2+ pedal pulses. No carotid bruits.  Abdomen: no tenderness, no masses palpated. No hepatosplenomegaly. Bowel sounds positive.  Musculoskeletal: no clubbing / cyanosis. No joint deformity upper and lower extremities. Good ROM, no contractures. Normal muscle tone.  Skin: no rashes, lesions, ulcers. No induration Neurologic: CN 2-12 grossly intact. Sensation intact, DTR normal. Strength 5/5 in all 4.  Psychiatric: Normal judgment and insight. Alert and oriented x 3. Normal mood.     Labs on Admission: I have personally reviewed following labs and imaging studies  CBC: Recent Labs  Lab 06/15/17 0355 06/15/17 0411  WBC 5.5  --   NEUTROABS 2.7  --   HGB 12.3 13.9  HCT 37.9 41.0  MCV 75.2*  --   PLT 259  --    Basic Metabolic Panel: Recent Labs  Lab 06/15/17 0355 06/15/17 0411  NA 137 140  K 4.1 4.0  CL 104 103  CO2 23  --   GLUCOSE 107* 110*  BUN 16 19  CREATININE 1.04* 1.00  CALCIUM 8.9  --    GFR: Estimated Creatinine Clearance: 67.7 mL/min (by C-G formula based on SCr of 1 mg/dL). Liver Function Tests: Recent Labs  Lab 06/15/17 0355  AST 35  ALT 21  ALKPHOS 122  BILITOT 0.7  PROT 8.1  ALBUMIN 4.1   No results for input(s): LIPASE, AMYLASE in the last 168 hours. No results  for input(s): AMMONIA in the last 168 hours. Coagulation Profile: Recent Labs  Lab 06/15/17 0355  INR 1.16   Cardiac Enzymes: No results for input(s): CKTOTAL, CKMB, CKMBINDEX, TROPONINI in the last 168 hours. BNP (last 3 results) No results for input(s): PROBNP in the last 8760 hours. HbA1C: No results for input(s): HGBA1C in the last 72 hours. CBG: No results for input(s): GLUCAP in the last 168 hours. Lipid Profile: No results for input(s): CHOL, HDL, LDLCALC, TRIG, CHOLHDL, LDLDIRECT in the last 72 hours. Thyroid Function Tests: No results for input(s): TSH, T4TOTAL, FREET4, T3FREE, THYROIDAB in the last 72 hours. Anemia Panel: No results for input(s): VITAMINB12, FOLATE, FERRITIN, TIBC, IRON, RETICCTPCT in the last 72 hours. Urine analysis:    Component Value Date/Time   COLORURINE YELLOW 03/04/2017 1520   APPEARANCEUR HAZY (A) 03/04/2017 1520   LABSPEC 1.001 (L) 03/04/2017 1520   PHURINE 5.0 03/04/2017 1520   GLUCOSEU >=500 (A) 03/04/2017 1520   GLUCOSEU NEGATIVE 04/17/2016 0955   HGBUR NEGATIVE 03/04/2017 1520   BILIRUBINUR NEGATIVE 03/04/2017 1520   KETONESUR NEGATIVE 03/04/2017 1520   PROTEINUR NEGATIVE 03/04/2017 1520   UROBILINOGEN 0.2 04/17/2016 0955   NITRITE NEGATIVE 03/04/2017 1520   LEUKOCYTESUR NEGATIVE 03/04/2017 1520   Sepsis Labs: No results found for this or any previous visit (from the past 240 hour(s)).   Radiological Exams on Admission: Mr Brain Wo Contrast  Result Date: 06/15/2017 CLINICAL DATA:  Dizziness and blurred vision.  Left-sided weakness. EXAM: MRI HEAD WITHOUT CONTRAST TECHNIQUE: Multiplanar, multiecho pulse sequences of the brain and surrounding structures were obtained without intravenous contrast. COMPARISON:  1. Brain MRI 08/05/2011 2. Head CT 04/29/2012 3. Head CT 06/15/2017 FINDINGS: Brain: There is no acute infarct or acute hemorrhage. No mass lesion, hydrocephalus, dural abnormality or extra-axial collection. Old infarct of the  right frontal lobe precentral gyrus corresponds to the hypodensity seen on the CT. Otherwise mild multifocal white matter hyperintensity consistent with chronic small vessel disease. The right parietal infarct is new compared to 08/05/2011. No chronic microhemorrhage or superficial siderosis. Vascular: Major intracranial arterial and venous sinus flow voids are preserved. Skull and upper cervical spine: The visualized skull base, calvarium, upper cervical spine and extracranial soft tissues are normal. Sinuses/Orbits: No fluid levels or advanced mucosal thickening. No mastoid or middle ear effusion. Normal orbits. IMPRESSION: 1. No acute ischemia. 2. 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 Electronically Signed   By: Ulyses Jarred M.D.   On: 06/15/2017 05:09   Ct Head Code Stroke Wo Contrast  Result Date: 06/15/2017 CLINICAL DATA:  Code stroke.  Left-sided weakness EXAM: CT HEAD WITHOUT CONTRAST TECHNIQUE: Contiguous axial images were obtained from the base of the skull through the vertex without intravenous contrast. COMPARISON:  Head CT  04/29/2012 FINDINGS: Brain: No mass lesion or acute hemorrhage. Faint focus of hypoattenuation fall within the right precentral gyrus, at the site of the hand motor area. Otherwise no focal parenchymal density abnormality. No hydrocephalus or age advanced atrophy. Incidentally noted partially empty sella. Vascular: No hyperdense vessel. No advanced atherosclerotic calcification of the arteries at the skull base. Skull: Normal visualized skull base, calvarium and extracranial soft tissues. Sinuses/Orbits: No sinus fluid levels or advanced mucosal thickening. No mastoid effusion. Normal orbits. ASPECTS [facility] Stroke Program Early CT Score) - Ganglionic level infarction (caudate, lentiform nuclei, internal capsule, insula, M1-M3 cortex): 6 - Supraganglionic infarction (M4-M6 cortex): 3 Total score (0-10 with 10 being  normal): 10 IMPRESSION: 1. No acute hemorrhage or mass lesion. 2. Mild hypoattenuation within the right precentral gyrus is age indeterminate and could be an early finding of ischemia. 3. ASPECTS is 9. These results were communicated to Dr. Karena Addison Aroor at 4:24 am on 06/15/2017 by text page via the [facility] messaging system. Electronically Signed   By: Ulyses Jarred M.D.   On: 06/15/2017 04:24    EKG: Independently reviewed.  Sinus rhythm at 60 bpm  Assessment/Plan Stroke-like symptoms: Resolving.  Patient presented initially with left-sided weakness and complaints of blurred vision that appear to be gradually resolving at this time.  CT and MRI negative only showing old stroke.  Neurology suspects Todd's paralysis related to seizure. - Admit to a telemetry bed - Seizure precautions - neuro checks  - PT/OT to eval and treat - Appreciate neurology consultative services, follow-up for further recommendations  Rectal bleeding: Acute.  Patient reports having BRBPR starting since yesterday.  Hemoglobin appears stable at 12.6.  Seen previously for similar symptoms by [name] in the past.  Question possibility of a diverticular bleed given previous history of diverticulosis. - NPO - Check stool guaiac - Recheck H&H - Likely warrant consult to [name]  Suspected diastolic CHF exacerbation: Patient reports weighing around 286 pounds with complaints of lower extremity edema, orthopnea, dyspnea on exertion. - Strict I&Os - Daily weights - 40 mg Lasix IV x1 dose, continue diuresis as needed - Check BNP and CXR - Check echocardiogram  Seizure disorder: Patient's last seizure was approximately 2 months ago.  Presenting symptoms thought to be possibly secondary to seizure - Monitor for seizure-like activity - Continue Lamictal - Ativan IV prn seizure-like activity  Dyspnea on exertion: - Patient will likely benefit from evaluation for need of home oxygen prior to discharge  Essential  hypertension - Continue Coreg, isosorbide mononitrate, metolazone q Sat, irbesartan substitution for Benicar  Diabetes mellitus type 2 - Hypoglycemic protocols - Held Victoza and jardiance - Continue Lantus  Parkinson's disease - Continue Sinemet  Renal insufficiency: Initial hemoglobin noted to be slightly elevated at 1.04, but patient recently took additional Lasix for suspected signs of fluid overload. - Continue to monitor  Hyperlipidemia - Continue atorvastatin  DVT prophylaxis: SCD Code Status: Full Family Communication: Discussed plan of care with the patient and family present at bedside Disposition Plan: TBD Consults called: Neurology Admission status: Patient  Norval Morton [name] [facility] Pager [phone]   If 7PM-7AM, please contact night-coverage [email or web address] Password [email or web address]  06/15/2017, 5:29 AM

## 2017-06-15 NOTE — ED Notes (Signed)
Blood collected

## 2017-06-15 NOTE — CV Procedure (Deleted)
Attempted 2D Echo at 1:10, Could not do echo due to patient's noncompliance.  [name]

## 2017-06-16 ENCOUNTER — Observation Stay (HOSPITAL_BASED_OUTPATIENT_CLINIC_OR_DEPARTMENT_OTHER): Payer: Medicare HMO

## 2017-06-16 DIAGNOSIS — G40909 Epilepsy, unspecified, not intractable, without status epilepticus: Secondary | ICD-10-CM | POA: Diagnosis not present

## 2017-06-16 DIAGNOSIS — R2990 Unspecified symptoms and signs involving the nervous system: Secondary | ICD-10-CM

## 2017-06-16 DIAGNOSIS — K922 Gastrointestinal hemorrhage, unspecified: Secondary | ICD-10-CM | POA: Diagnosis not present

## 2017-06-16 DIAGNOSIS — I361 Nonrheumatic tricuspid (valve) insufficiency: Secondary | ICD-10-CM

## 2017-06-16 DIAGNOSIS — I5032 Chronic diastolic (congestive) heart failure: Secondary | ICD-10-CM | POA: Diagnosis not present

## 2017-06-16 DIAGNOSIS — G8384 Todd's paralysis (postepileptic): Secondary | ICD-10-CM | POA: Diagnosis not present

## 2017-06-16 DIAGNOSIS — R0609 Other forms of dyspnea: Secondary | ICD-10-CM | POA: Diagnosis not present

## 2017-06-16 DIAGNOSIS — I5031 Acute diastolic (congestive) heart failure: Secondary | ICD-10-CM | POA: Diagnosis not present

## 2017-06-16 DIAGNOSIS — K625 Hemorrhage of anus and rectum: Secondary | ICD-10-CM | POA: Diagnosis not present

## 2017-06-16 DIAGNOSIS — R569 Unspecified convulsions: Secondary | ICD-10-CM | POA: Diagnosis not present

## 2017-06-16 DIAGNOSIS — E1165 Type 2 diabetes mellitus with hyperglycemia: Secondary | ICD-10-CM | POA: Diagnosis not present

## 2017-06-16 DIAGNOSIS — J9621 Acute and chronic respiratory failure with hypoxia: Secondary | ICD-10-CM | POA: Diagnosis not present

## 2017-06-16 LAB — CBC
HCT: 32.4 % — ABNORMAL LOW (ref 36.0–46.0)
Hemoglobin: 9.8 g/dL — ABNORMAL LOW (ref 12.0–15.0)
MCH: 23 pg — ABNORMAL LOW (ref 26.0–34.0)
MCHC: 30.2 g/dL (ref 30.0–36.0)
MCV: 75.9 fL — ABNORMAL LOW (ref 78.0–100.0)
Platelets: 224 10*3/uL (ref 150–400)
RBC: 4.27 MIL/uL (ref 3.87–5.11)
RDW: 16.8 % — ABNORMAL HIGH (ref 11.5–15.5)
WBC: 5.3 10*3/uL (ref 4.0–10.5)

## 2017-06-16 LAB — BASIC METABOLIC PANEL
Anion gap: 4 — ABNORMAL LOW (ref 5–15)
BUN: 12 mg/dL (ref 6–20)
CO2: 29 mmol/L (ref 22–32)
Calcium: 8.2 mg/dL — ABNORMAL LOW (ref 8.9–10.3)
Chloride: 104 mmol/L (ref 101–111)
Creatinine, Ser: 1 mg/dL (ref 0.44–1.00)
GFR calc Af Amer: >60 mL/min (ref >60)
GFR calc non Af Amer: 56 mL/min — ABNORMAL LOW (ref >60)
Glucose, Bld: 101 mg/dL — ABNORMAL HIGH (ref 65–99)
Potassium: 3.7 mmol/L (ref 3.5–5.1)
Sodium: 137 mmol/L (ref 135–145)

## 2017-06-16 LAB — GLUCOSE, CAPILLARY
Glucose-Capillary: 75 mg/dL (ref 65–99)
Glucose-Capillary: 126 mg/dL — ABNORMAL HIGH (ref 65–99)
Glucose-Capillary: 71 mg/dL (ref 65–99)
Glucose-Capillary: 126 mg/dL — ABNORMAL HIGH (ref 65–99)

## 2017-06-16 LAB — HEMOGLOBIN AND HEMATOCRIT, BLOOD
HCT: 32.1 % — ABNORMAL LOW (ref 36.0–46.0)
HCT: 36.7 % (ref 36.0–46.0)
Hemoglobin: 9.7 g/dL — ABNORMAL LOW (ref 12.0–15.0)
Hemoglobin: 10.9 g/dL — ABNORMAL LOW (ref 12.0–15.0)

## 2017-06-16 LAB — ECHOCARDIOGRAM COMPLETE
Height: 61 in
Weight: 4620.84 oz

## 2017-06-16 MED ORDER — PEG 3350-KCL-NA BICARB-NACL 420 G PO SOLR
4000.0000 mL | Freq: Once | ORAL | Status: AC
  Administered 2017-06-16: 4000 mL via ORAL
  Filled 2017-06-16: qty 4000

## 2017-06-16 MED ORDER — SODIUM CHLORIDE 0.9 % IV SOLN
INTRAVENOUS | Status: DC
  Administered 2017-06-16: 21:00:00 via INTRAVENOUS

## 2017-06-16 NOTE — Progress Notes (Signed)
  Echocardiogram 2D Echocardiogram has been performed.  Darlina Sicilian M 06/16/2017, 1:14 PM

## 2017-06-16 NOTE — H&P (View-Only) (Signed)
[facility] Consult  Referring Provider: Dessa Phi, DO Primary Care Physician:  [name], MD Primary Gastroenterologist: Dr.Outlaw  Reason for Consultation:  Rectal bleeding  HPI: [name] is a 71 y.o.African-American  female was admitted on 06/15/17 with left-sided weakness.eurology workup was negative for stroke. Patient states that she had rectal bleeding a month ago, described as bright red blood and not associated with abdominal rectal pain subsided on its own.However,on Friday 2 days ago, she developed multiple episodes of bright red blood per rectum along with passage of clots, up to a cupful at the time. Patient states since she had her gallbladder removed, she has a bowel movement with each meal, usually 2-3 times a day. She denies abdominal pain, has mild nausea without vomiting, denies black stools/acid reflux/regurgitation/difficulty swallowing or pain on swallowing.  She has history of chronic pancreatitis and takes pancreatic supplements. She had an EUS in 2013 and an ERCP with pancreatic stent placement, resolution of pseudocyst noted thereafter. Last colonoscopy was in February 2014, it tubular adenoma was removed from descending colon and she was noted to have diverticulosis.  Patient denies loss of appetite or unintentional weight loss recently. Patient takes a baby aspirin but denies use of any other NSAIDs or blood thinners.   Past Medical History:  Diagnosis Date  . Abnormal liver function     in the past.  . Anemia   . Arthritis    all over  . Bruises easily   . Cataract    right eye;immature  . CHF (congestive heart failure) ([place])   . Chronic back pain    stenosis  . Chronic cough   . Demand myocardial infarction [facility]) 2012   Demand Infarction in setting of Pancreatitis --> mild Troponin elevation, Non-obstructive CAD  . Depression    takes Abilify daily as well as Zoloft  . Diastolic heart failure 7902   Grade 1 diastolic  Dysfunction by Echo   . Diverticulosis   . DM (diabetes mellitus) ([place])    takes Victoza daily as well as Lantus and Humalog  . Empty sella ([place])    on MRI in 2009.  . Glaucoma   . Headache(784.0)    last migraine-4-11yr ago  . History of blood transfusion    no abnormal reaction noted  . History of colon polyps    benign  . History of influenza    antibiotic given yesterday along with taking Delsym cough syrup  . HTN (hypertension)    takes BKinder Morgan Energydaily  . Hyperlipidemia    takes Lipitor daily  . Joint swelling   . Nocturia   . Obstructive sleep apnea   . Pancreatitis    takes Pancrelipase daily  . Parkinson's disease ([place]    takes Sinemet daily  . Peripheral edema    takes Lasix daily  . Peripheral neuropathy   . Pneumonia 2012  . Seizure disorder ([place]   . Seizures ([place]    takes Lamictal daily and Primidone nightly;last seizure 2wks ago  . Urinary frequency   . Urinary urgency   . Varicose veins of both lower extremities with pain     Past Surgical History:  Procedure Laterality Date  . ABDOMINAL HYSTERECTOMY    . APPENDECTOMY    . BACK SURGERY    . CARDIAC CATHETERIZATION  2009/March 2012   2009: (Dr. KAlvie Heidelberg Nonobstructive CAD; 2012: Minimal CAD --> false positive stress test  . Cardiac Event Monitor  September-October 2017   Sinus rhythm with occasional  PACs and artifact. No arrhythmias besides one short run of tachycardia.  . CHOLECYSTECTOMY    . COLONOSCOPY N/A 08/15/2012   Procedure: COLONOSCOPY;  Surgeon: Arta Silence, MD;  Location: [facility];  Service: Endoscopy;  Laterality: N/A;  . ESOPHAGOGASTRODUODENOSCOPY    . eye cysts Bilateral   . LASIK    . NM MYOVIEW LTD  March 2012; Aug 2017   Probably normal Lexiscan Myoview. Mild reversible Anterior Defect ~ Breast Attenuation. (CRO ischemia):  FALSE POSITIVE by Cath; b) 8/'17: LOW RISK, No ischemia or Infarction.  Breast Attenuation not noted.  Marland Kitchen NM MYOVIEW LTD  01/2016   LOW  RISK. No ischemia or infarction.  Marland Kitchen RECTAL POLYPECTOMY    . TONSILLECTOMY    . TRANSTHORACIC ECHOCARDIOGRAM  01/2016   Normal LV chamber size with moderate LVH pattern. EF 50-55%. Severe LA dilation. Moderate RA dilation. PA pressure elevated at 38 mmHg (mild as (  . TUBAL LIGATION    . vaginal cyst removed     several times    Prior to Admission medications   Medication Sig Start Date End Date Taking? Authorizing Provider  ACCU-CHEK FASTCLIX LANCETS MISC USE  TO CHECK BLOOD SUGAR THREE TIMES DAILY 06/24/15  Yes Elayne Snare, MD  ACCU-CHEK SMARTVIEW test strip [place] DAILY 04/17/16  Yes Elayne Snare, MD  acetaminophen (TYLENOL) 500 MG tablet Take 500-1,000 mg by mouth every 4 (four) hours as needed (for pain).    Yes [provider]  Alcohol Swabs (B-D SINGLE USE SWABS REGULAR) PADS Use as directed 12/22/15  Yes Elayne Snare, MD  aspirin EC 81 MG tablet Take 1 tablet (81 mg total) by mouth at bedtime. 11/12/12  Yes Hosie Poisson, MD  atorvastatin (LIPITOR) 10 MG tablet Take 10 mg by mouth daily.   Yes [provider]  B Complex-C (B-COMPLEX WITH VITAMIN C) tablet Take 1 tablet by mouth daily.   Yes [provider]  Blood Glucose Monitoring Suppl (ACCU-CHEK NANO SMARTVIEW) W/DEVICE KIT Use to check blood sugar 3 times per day dx code E11.65 07/19/14  Yes Elayne Snare, MD  carbidopa-levodopa (SINEMET IR) 25-100 MG per tablet Take 1 tablet by mouth 3 (three) times daily.  02/11/13  Yes [provider]  carvedilol (COREG) 25 MG tablet Take 1 tablet (25 mg total) by mouth 2 (two) times daily. 06/11/16 09/13/21 Yes Leonie Man, MD  Cholecalciferol (VITAMIN D-3) 1000 units CAPS Take 1,000 Units by mouth daily.   Yes [provider]  cholestyramine Lucrezia Starch) 4 G packet Take 4 g by mouth 2 (two) times daily as needed (for diarrhea).    Yes [provider]  furosemide (LASIX) 40 MG tablet Take 1 tablet (40 mg total) by mouth  daily. 03/05/17  Yes Waynetta Pean, PA-C  insulin aspart (NOVOLOG FLEXPEN) 100 UNIT/ML FlexPen Inject4- 6 units before breakfast and lunch and 10-12 units before dinner Patient taking differently: Inject 6-10 Units into the skin See admin instructions. 6 units before breakfast then 6 units before lunch then 10 units before dinner (evening meal) 04/17/16  Yes Elayne Snare, MD  Insulin Glargine (TOUJEO SOLOSTAR) 300 UNIT/ML SOPN Inject 40 Units into the skin daily. Patient taking differently: Inject 36 Units into the skin at bedtime.  04/17/16  Yes Elayne Snare, MD  Insulin Pen Needle 31G X 8 MM MISC Use to inject medication 4 times daily 12/22/15  Yes Elayne Snare, MD  isosorbide mononitrate (IMDUR) 30 MG 24 hr tablet TAKE 1 TABLET (  30 MG TOTAL) BY MOUTH DAILY. 07/16/16  Yes Leonie Man, MD  JARDIANCE 10 MG TABS tablet TAKE ONE TABLET BY MOUTH ONCE DAILY WITH  BREAKFAST Patient taking differently: Take 10 mg by mouth once a day with breakfast 08/14/16  Yes Elayne Snare, MD  lamoTRIgine (LAMICTAL) 100 MG tablet Take 100 mg by mouth daily.   Yes [provider]  Menthol, Topical Analgesic, 3.5 % GEL Apply 1 application topically as needed (for neuropathy in feet).    Yes [provider]  metolazone (ZAROXOLYN) 2.5 MG tablet Take 1 tablet (2.5 mg total) by mouth once a week. Patient taking differently: Take 2.5 mg by mouth every Saturday.  01/29/17 06/15/17 Yes Barrett, Evelene Croon, PA-C  Multiple Vitamins-Minerals (MULTIVITAMIN WITH MINERALS) tablet Take 1 tablet by mouth daily.   Yes [provider]  naproxen (NAPROSYN) 500 MG tablet Take 500 mg by mouth 2 (two) times daily as needed for pain. 02/15/17  Yes [provider]  olmesartan (BENICAR) 40 MG tablet Take 40 mg by mouth daily.   Yes [provider]  Pancrelipase, Lip-Prot-Amyl, 25000 UNITS CPEP Take 25,000-50,000 Units by mouth See admin instructions. 50,000 units with breakfast then 25,000 units with lunch  then 50,000 units with dinner (evening meal)   Yes [provider]  potassium chloride SA (K-DUR,KLOR-CON) 20 MEQ tablet Take 2 tablets (40 mEq total) by mouth daily. On the day you take metolazone, take an extra 20 meq (60 meq total) Patient taking differently: Take 40 mEq by mouth daily.  02/01/17  Yes Almyra Deforest, PA  pregabalin (LYRICA) 100 MG capsule Take 100 mg by mouth 2 (two) times daily.   Yes [provider]  sertraline (ZOLOFT) 50 MG tablet Take 50 mg by mouth every evening.    Yes [provider]  VICTOZA 18 MG/3ML SOPN INJECT 1.8MG SUBCUTANEOUSLY DAILY (NEEDS APPOINTMENT FOR REFILLS) Patient taking differently: Inject 1.8 mg into the skin daily at lunchtime 10/05/16  Yes Elayne Snare, MD    Current Facility-Administered Medications  Medication Dose Route Frequency Provider Last Rate Last Dose  .  stroke: mapping our early stages of recovery book   Does not apply Once Norval Morton, MD      . acetaminophen (TYLENOL) tablet 650 mg  650 mg Oral Q4H PRN Norval Morton, MD       Or  . acetaminophen (TYLENOL) solution 650 mg  650 mg Per Tube Q4H PRN Norval Morton, MD       Or  . acetaminophen (TYLENOL) suppository 650 mg  650 mg Rectal Q4H PRN Tamala Julian, Rondell A, MD      . atorvastatin (LIPITOR) tablet 10 mg  10 mg Oral QPM Smith, Rondell A, MD   10 mg at 06/15/17 1737  . carbidopa-levodopa (SINEMET IR) 25-100 MG per tablet immediate release 1 tablet  1 tablet Oral TID Norval Morton, MD   1 tablet at 06/15/17 2152  . carvedilol (COREG) tablet 25 mg  25 mg Oral BID Fuller Plan A, MD   25 mg at 06/15/17 2154  . furosemide (LASIX) tablet 40 mg  40 mg Oral BID Dessa Phi, DO   40 mg at 06/15/17 1737  . insulin aspart (novoLOG) injection 0-15 Units  0-15 Units Subcutaneous TID WC Norval Morton, MD   2 Units at 06/15/17 1737  . insulin glargine (LANTUS) injection 36 Units  36 Units Subcutaneous QHS Dessa Phi, DO   36 Units at 06/15/17 2152  .  irbesartan (AVAPRO) tablet 300 mg  300 mg Oral Daily Tamala Julian, Rondell A, MD   300 mg at 06/15/17 1450  . isosorbide mononitrate (IMDUR) 24 hr tablet 30 mg  30 mg Oral Daily Tamala Julian, Rondell A, MD   30 mg at 06/15/17 1156  . lamoTRIgine (LAMICTAL) tablet 100 mg  100 mg Oral BID Kerney Elbe, MD   100 mg at 06/15/17 2152  . lipase/protease/amylase (CREON) capsule 24,000 Units  24,000 Units Oral Q lunch Dessa Phi, DO   24,000 Units at 06/15/17 1450  . lipase/protease/amylase (CREON) capsule 48,000 Units  48,000 Units Oral 2 times per day Dessa Phi, DO   48,000 Units at 06/15/17 1737  . LORazepam (ATIVAN) injection 1-2 mg  1-2 mg Intravenous Q2H PRN Smith, Rondell A, MD      . metolazone (ZAROXOLYN) tablet 2.5 mg  2.5 mg Oral Q Sat Smith, Rondell A, MD   2.5 mg at 06/15/17 1450  . polyethylene glycol-electrolytes (NuLYTELY/GoLYTELY) solution 4,000 mL  4,000 mL Oral Once [name], MD      . potassium chloride SA (K-DUR,KLOR-CON) CR tablet 40 mEq  40 mEq Oral Daily Fuller Plan A, MD   40 mEq at 06/15/17 1156  . pregabalin (LYRICA) capsule 100 mg  100 mg Oral BID Fuller Plan A, MD   100 mg at 06/15/17 2152  . sertraline (ZOLOFT) tablet 50 mg  50 mg Oral QPM Smith, Rondell A, MD   50 mg at 06/15/17 1738    Allergies as of 06/15/2017 - Review Complete 06/15/2017  Allergen Reaction Noted  . Penicillins Hives 10/26/2010  . Sulfa antibiotics Hives 10/26/2010  . Codeine  10/26/2010  . Other Rash 05/18/2012    Family History  Problem Relation Age of Onset  . Allergies Father   . Heart disease Father        before 49  . Heart failure Father   . Hypertension Father   . Hyperlipidemia Father   . Heart disease Mother   . Diabetes Mother   . Hypertension Mother   . Hyperlipidemia Mother   . Hypertension Sister   . Heart disease Sister        before 2  . Hyperlipidemia Sister   . Diabetes Brother   . Hypertension Brother   . Diabetes Sister   . Hypertension Sister   . Diabetes  Son   . Hypertension Son   . Cancer Other     Social History   Socioeconomic History  . Marital status: Divorced    Spouse name: Not on file  . Number of children: Not on file  . Years of education: Not on file  . Highest education level: Not on file  Social Needs  . Financial resource strain: Not on file  . Food insecurity - worry: Not on file  . Food insecurity - inability: Not on file  . Transportation needs - medical: Not on file  . Transportation needs - non-medical: Not on file  Occupational History  . Occupation: Disabled  Tobacco Use  . Smoking status: Former Smoker    Packs/day: 0.50    Years: 25.00    Pack years: 12.50    Types: Cigarettes  . Smokeless tobacco: Never Used  . Tobacco comment: quit smoking 20+ytrs ago  Substance and Sexual Activity  . Alcohol use: No  . Drug use: No  . Sexual activity: No    Birth control/protection: Surgical  Other Topics Concern  . Not on file  Social History Narrative  She lives in [place]. She has lots of family in the area and is accompanied by her sister.   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   She has a distant history of smoking, quit 20 years ago.    Review of Systems: Positive for: GI: Described in detail in HPI.    Gen: Denies any fever, chills, rigors, night sweats, anorexia, fatigue, weakness, malaise, involuntary weight loss, and sleep disorder CV: Denies chest pain, angina, palpitations, syncope, orthopnea, PND, peripheral edema, and claudication. Resp: dyspnea, Denies cough, sputum, wheezing, coughing up blood. GU : Denies urinary burning, blood in urine, urinary frequency, urinary hesitancy, nocturnal urination, and urinary incontinence. MS: Denies joint pain or swelling.  Denies muscle weakness, cramps, atrophy.  Derm: Denies rash, itching, oral ulcerations, hives, unhealing ulcers.  Psych: Denies depression, anxiety, memory loss, suicidal ideation, hallucinations,   and confusion. Heme:  Rectal bleeding,Denies bruising, and enlarged lymph nodes. Neuro:  Denies any headaches, dizziness, paresthesias. Endo:  Denies any problems with DM, thyroid, adrenal function.  Physical Exam: Vital signs in last 24 hours: Temp:  [97.9 F (36.6 C)-98.8 F (37.1 C)] 97.9 F (36.6 C) (12/23 0530) Pulse Rate:  [55-66] 56 (12/23 0530) Resp:  [15-20] 16 (12/23 0530) BP: (106-135)/(43-62) 106/43 (12/23 0530) SpO2:  [97 %-100 %] 98 % (12/23 0530) Weight:  [131 kg (288 lb 12.8 oz)] 131 kg (288 lb 12.8 oz) (12/23 0500) Last BM Date: 06/15/17  General:   Alert,  overweight, pleasant and cooperative in NAD Head:  Normocephalic and atraumatic. Eyes:  Sclera clear, no icterus.   Mild pallor Ears:  Normal auditory acuity. Nose:  No deformity, discharge,  or lesions. Mouth:  No deformity or lesions.  Oropharynx pink & moist. Neck:  Supple; no masses or thyromegaly. Lungs: on oxygen via nasal cannula Clear throughout to auscultation.   No wheezes, crackles, or rhonchi. No acute distress. Heart:  Regular rate and rhythm; no murmurs, clicks, rubs,  or gallops. Extremities:  Without clubbing or edema. Neurologic:  Alert and  oriented x4;  grossly normal neurologically. Skin:  Intact without significant lesions or rashes. Psych:  Alert and cooperative. Normal mood and affect. Abdomen:  Soft, nontender and nondistended. No masses, hepatosplenomegaly or hernias noted. Normal bowel sounds, without guarding, and without rebound.         Lab Results: Recent Labs    06/15/17 0355 06/15/17 0411 06/15/17 1315 06/16/17 0019  WBC 5.5  --   --  5.3  HGB 12.3 13.9 10.2* 9.8*  9.7*  HCT 37.9 41.0 33.3* 32.4*  32.1*  PLT 259  --   --  224   BMET Recent Labs    06/15/17 0355 06/15/17 0411 06/16/17 0019  NA 137 140 137  K 4.1 4.0 3.7  CL 104 103 104  CO2 23  --  29  GLUCOSE 107* 110* 101*  BUN _0 CREATININE 1.04* 1.00 1.00  CALCIUM 8.9  --  8.2*   LFT Recent  Labs    06/15/17 0355  PROT 8.1  ALBUMIN 4.1  AST 35  ALT 21  ALKPHOS 122  BILITOT 0.7   PT/INR Recent Labs    06/15/17 0355  LABPROT 14.7  INR 1.16    Studies/Results: Dg Chest 2 View  Result Date: 06/15/2017 CLINICAL DATA:  Pt arrived via EMS from home with c/o HA, LSN 2200, pt awoke @ 0200 with HA, dizziness and blurred vision. L sided weakness. Hx of  CHF, MI, diastolic heart failure, diabetes, HTN, PNA EXAM: CHEST  2 VIEW COMPARISON:  03/04/2017 FINDINGS: Cardiac silhouette is mildly enlarged. No mediastinal hilar masses. There is prominence of the hila bilaterally which appears vascular. Central vascular congestion. Lungs otherwise clear with no overt pulmonary edema. No pleural effusion or pneumothorax. Skeletal structures are grossly intact. IMPRESSION: 1. No acute cardiopulmonary disease. 2. Mild cardiomegaly and central vascular congestion similar to the prior study. Electronically Signed   By: Lajean Manes M.D.   On: 06/15/2017 07:22   Mr Brain Wo Contrast  Result Date: 06/15/2017 CLINICAL DATA:  Dizziness and blurred vision.  Left-sided weakness. EXAM: MRI HEAD WITHOUT CONTRAST TECHNIQUE: Multiplanar, multiecho pulse sequences of the brain and surrounding structures were obtained without intravenous contrast. COMPARISON:  1. Brain MRI 08/05/2011 2. Head CT 04/29/2012 3. Head CT 06/15/2017 FINDINGS: Brain: There is no acute infarct or acute hemorrhage. No mass lesion, hydrocephalus, dural abnormality or extra-axial collection. Old infarct of the right frontal lobe precentral gyrus corresponds to the hypodensity seen on the CT. Otherwise mild multifocal white matter hyperintensity consistent with chronic small vessel disease. The right parietal infarct is new compared to 08/05/2011. No chronic microhemorrhage or superficial siderosis. Vascular: Major intracranial arterial and venous sinus flow voids are preserved. Skull and upper cervical spine: The visualized skull base,  calvarium, upper cervical spine and extracranial soft tissues are normal. Sinuses/Orbits: No fluid levels or advanced mucosal thickening. No mastoid or middle ear effusion. Normal orbits. IMPRESSION: 1. No acute ischemia. 2. 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 Electronically Signed   By: Ulyses Jarred M.D.   On: 06/15/2017 05:09   Ct Head Code Stroke Wo Contrast  Result Date: 06/15/2017 CLINICAL DATA:  Code stroke.  Left-sided weakness EXAM: CT HEAD WITHOUT CONTRAST TECHNIQUE: Contiguous axial images were obtained from the base of the skull through the vertex without intravenous contrast. COMPARISON:  Head CT 04/29/2012 FINDINGS: Brain: No mass lesion or acute hemorrhage. Faint focus of hypoattenuation fall within the right precentral gyrus, at the site of the hand motor area. Otherwise no focal parenchymal density abnormality. No hydrocephalus or age advanced atrophy. Incidentally noted partially empty sella. Vascular: No hyperdense vessel. No advanced atherosclerotic calcification of the arteries at the skull base. Skull: Normal visualized skull base, calvarium and extracranial soft tissues. Sinuses/Orbits: No sinus fluid levels or advanced mucosal thickening. No mastoid effusion. Normal orbits. ASPECTS [facility] Stroke Program Early CT Score) - Ganglionic level infarction (caudate, lentiform nuclei, internal capsule, insula, M1-M3 cortex): 6 - Supraganglionic infarction (M4-M6 cortex): 3 Total score (0-10 with 10 being normal): 10 IMPRESSION: 1. No acute hemorrhage or mass lesion. 2. Mild hypoattenuation within the right precentral gyrus is age indeterminate and could be an early finding of ischemia. 3. ASPECTS is 9. These results were communicated to Dr. Karena Addison Aroor at 4:24 am on 06/15/2017 by text page via the [facility] messaging system. Electronically Signed   By: Ulyses Jarred M.D.   On: 06/15/2017 04:24    Impression: 1.Painless  hematochezia, Hemoglobin 12.3-13.9 on admission,has dropped down to 9.7,microcytosis MCV 75.9, normal platelets Normal BUN/creatinine ratio,normal PT/INR  2.Possible seizure with postictal Todd's paresis and postictal headache as per neurology evaluation 3.Chronic pancreatitis,on pancreatic supplements   Plan:  Likely diverticular bleed, less likely ischemic colitis as patient had no abdominal pain. Discussed about the risk and benefits of the procedure with the patient. She understands and verbalizes consent, she will be kept on clear liquid  diet nothing by mouth post midnight and will be given split prep for colonoscopy to be performed in a.m..   LOS: 0 days   [name], M.D.  06/16/2017, 8:28 AM  Pager [phone] If no answer or after 5 PM call [phone]

## 2017-06-16 NOTE — Consult Note (Signed)
[facility] Consult  Referring Provider: Dessa Phi, DO Primary Care Physician:  [name], MD Primary Gastroenterologist: Dr.Outlaw  Reason for Consultation:  Rectal bleeding  HPI: [name] is a 71 y.o.African-American  female was admitted on 06/15/17 with left-sided weakness.eurology workup was negative for stroke. Patient states that she had rectal bleeding a month ago, described as bright red blood and not associated with abdominal rectal pain subsided on its own.However,on Friday 2 days ago, she developed multiple episodes of bright red blood per rectum along with passage of clots, up to a cupful at the time. Patient states since she had her gallbladder removed, she has a bowel movement with each meal, usually 2-3 times a day. She denies abdominal pain, has mild nausea without vomiting, denies black stools/acid reflux/regurgitation/difficulty swallowing or pain on swallowing.  She has history of chronic pancreatitis and takes pancreatic supplements. She had an EUS in 2013 and an ERCP with pancreatic stent placement, resolution of pseudocyst noted thereafter. Last colonoscopy was in February 2014, it tubular adenoma was removed from descending colon and she was noted to have diverticulosis.  Patient denies loss of appetite or unintentional weight loss recently. Patient takes a baby aspirin but denies use of any other NSAIDs or blood thinners.   Past Medical History:  Diagnosis Date  . Abnormal liver function     in the past.  . Anemia   . Arthritis    all over  . Bruises easily   . Cataract    right eye;immature  . CHF (congestive heart failure) ([place])   . Chronic back pain    stenosis  . Chronic cough   . Demand myocardial infarction [facility]) 2012   Demand Infarction in setting of Pancreatitis --> mild Troponin elevation, Non-obstructive CAD  . Depression    takes Abilify daily as well as Zoloft  . Diastolic heart failure 7902   Grade 1 diastolic  Dysfunction by Echo   . Diverticulosis   . DM (diabetes mellitus) ([place])    takes Victoza daily as well as Lantus and Humalog  . Empty sella ([place])    on MRI in 2009.  . Glaucoma   . Headache(784.0)    last migraine-4-11yr ago  . History of blood transfusion    no abnormal reaction noted  . History of colon polyps    benign  . History of influenza    antibiotic given yesterday along with taking Delsym cough syrup  . HTN (hypertension)    takes BKinder Morgan Energydaily  . Hyperlipidemia    takes Lipitor daily  . Joint swelling   . Nocturia   . Obstructive sleep apnea   . Pancreatitis    takes Pancrelipase daily  . Parkinson's disease ([place]    takes Sinemet daily  . Peripheral edema    takes Lasix daily  . Peripheral neuropathy   . Pneumonia 2012  . Seizure disorder ([place]   . Seizures ([place]    takes Lamictal daily and Primidone nightly;last seizure 2wks ago  . Urinary frequency   . Urinary urgency   . Varicose veins of both lower extremities with pain     Past Surgical History:  Procedure Laterality Date  . ABDOMINAL HYSTERECTOMY    . APPENDECTOMY    . BACK SURGERY    . CARDIAC CATHETERIZATION  2009/March 2012   2009: (Dr. KAlvie Heidelberg Nonobstructive CAD; 2012: Minimal CAD --> false positive stress test  . Cardiac Event Monitor  September-October 2017   Sinus rhythm with occasional  PACs and artifact. No arrhythmias besides one short run of tachycardia.  . CHOLECYSTECTOMY    . COLONOSCOPY N/A 08/15/2012   Procedure: COLONOSCOPY;  Surgeon: Arta Silence, MD;  Location: [facility];  Service: Endoscopy;  Laterality: N/A;  . ESOPHAGOGASTRODUODENOSCOPY    . eye cysts Bilateral   . LASIK    . NM MYOVIEW LTD  March 2012; Aug 2017   Probably normal Lexiscan Myoview. Mild reversible Anterior Defect ~ Breast Attenuation. (CRO ischemia):  FALSE POSITIVE by Cath; b) 8/'17: LOW RISK, No ischemia or Infarction.  Breast Attenuation not noted.  Marland Kitchen NM MYOVIEW LTD  01/2016   LOW  RISK. No ischemia or infarction.  Marland Kitchen RECTAL POLYPECTOMY    . TONSILLECTOMY    . TRANSTHORACIC ECHOCARDIOGRAM  01/2016   Normal LV chamber size with moderate LVH pattern. EF 50-55%. Severe LA dilation. Moderate RA dilation. PA pressure elevated at 38 mmHg (mild as (  . TUBAL LIGATION    . vaginal cyst removed     several times    Prior to Admission medications   Medication Sig Start Date End Date Taking? Authorizing Provider  ACCU-CHEK FASTCLIX LANCETS MISC USE  TO CHECK BLOOD SUGAR THREE TIMES DAILY 06/24/15  Yes Elayne Snare, MD  ACCU-CHEK SMARTVIEW test strip [place] DAILY 04/17/16  Yes Elayne Snare, MD  acetaminophen (TYLENOL) 500 MG tablet Take 500-1,000 mg by mouth every 4 (four) hours as needed (for pain).    Yes [provider]  Alcohol Swabs (B-D SINGLE USE SWABS REGULAR) PADS Use as directed 12/22/15  Yes Elayne Snare, MD  aspirin EC 81 MG tablet Take 1 tablet (81 mg total) by mouth at bedtime. 11/12/12  Yes Hosie Poisson, MD  atorvastatin (LIPITOR) 10 MG tablet Take 10 mg by mouth daily.   Yes [provider]  B Complex-C (B-COMPLEX WITH VITAMIN C) tablet Take 1 tablet by mouth daily.   Yes [provider]  Blood Glucose Monitoring Suppl (ACCU-CHEK NANO SMARTVIEW) W/DEVICE KIT Use to check blood sugar 3 times per day dx code E11.65 07/19/14  Yes Elayne Snare, MD  carbidopa-levodopa (SINEMET IR) 25-100 MG per tablet Take 1 tablet by mouth 3 (three) times daily.  02/11/13  Yes [provider]  carvedilol (COREG) 25 MG tablet Take 1 tablet (25 mg total) by mouth 2 (two) times daily. 06/11/16 09/13/21 Yes Leonie Man, MD  Cholecalciferol (VITAMIN D-3) 1000 units CAPS Take 1,000 Units by mouth daily.   Yes [provider]  cholestyramine Lucrezia Starch) 4 G packet Take 4 g by mouth 2 (two) times daily as needed (for diarrhea).    Yes [provider]  furosemide (LASIX) 40 MG tablet Take 1 tablet (40 mg total) by mouth  daily. 03/05/17  Yes Waynetta Pean, PA-C  insulin aspart (NOVOLOG FLEXPEN) 100 UNIT/ML FlexPen Inject4- 6 units before breakfast and lunch and 10-12 units before dinner Patient taking differently: Inject 6-10 Units into the skin See admin instructions. 6 units before breakfast then 6 units before lunch then 10 units before dinner (evening meal) 04/17/16  Yes Elayne Snare, MD  Insulin Glargine (TOUJEO SOLOSTAR) 300 UNIT/ML SOPN Inject 40 Units into the skin daily. Patient taking differently: Inject 36 Units into the skin at bedtime.  04/17/16  Yes Elayne Snare, MD  Insulin Pen Needle 31G X 8 MM MISC Use to inject medication 4 times daily 12/22/15  Yes Elayne Snare, MD  isosorbide mononitrate (IMDUR) 30 MG 24 hr tablet TAKE 1 TABLET (  30 MG TOTAL) BY MOUTH DAILY. 07/16/16  Yes Leonie Man, MD  JARDIANCE 10 MG TABS tablet TAKE ONE TABLET BY MOUTH ONCE DAILY WITH  BREAKFAST Patient taking differently: Take 10 mg by mouth once a day with breakfast 08/14/16  Yes Elayne Snare, MD  lamoTRIgine (LAMICTAL) 100 MG tablet Take 100 mg by mouth daily.   Yes [provider]  Menthol, Topical Analgesic, 3.5 % GEL Apply 1 application topically as needed (for neuropathy in feet).    Yes [provider]  metolazone (ZAROXOLYN) 2.5 MG tablet Take 1 tablet (2.5 mg total) by mouth once a week. Patient taking differently: Take 2.5 mg by mouth every Saturday.  01/29/17 06/15/17 Yes Barrett, Evelene Croon, PA-C  Multiple Vitamins-Minerals (MULTIVITAMIN WITH MINERALS) tablet Take 1 tablet by mouth daily.   Yes [provider]  naproxen (NAPROSYN) 500 MG tablet Take 500 mg by mouth 2 (two) times daily as needed for pain. 02/15/17  Yes [provider]  olmesartan (BENICAR) 40 MG tablet Take 40 mg by mouth daily.   Yes [provider]  Pancrelipase, Lip-Prot-Amyl, 25000 UNITS CPEP Take 25,000-50,000 Units by mouth See admin instructions. 50,000 units with breakfast then 25,000 units with lunch  then 50,000 units with dinner (evening meal)   Yes [provider]  potassium chloride SA (K-DUR,KLOR-CON) 20 MEQ tablet Take 2 tablets (40 mEq total) by mouth daily. On the day you take metolazone, take an extra 20 meq (60 meq total) Patient taking differently: Take 40 mEq by mouth daily.  02/01/17  Yes Almyra Deforest, PA  pregabalin (LYRICA) 100 MG capsule Take 100 mg by mouth 2 (two) times daily.   Yes [provider]  sertraline (ZOLOFT) 50 MG tablet Take 50 mg by mouth every evening.    Yes [provider]  VICTOZA 18 MG/3ML SOPN INJECT 1.8MG SUBCUTANEOUSLY DAILY (NEEDS APPOINTMENT FOR REFILLS) Patient taking differently: Inject 1.8 mg into the skin daily at lunchtime 10/05/16  Yes Elayne Snare, MD    Current Facility-Administered Medications  Medication Dose Route Frequency Provider Last Rate Last Dose  .  stroke: mapping our early stages of recovery book   Does not apply Once Norval Morton, MD      . acetaminophen (TYLENOL) tablet 650 mg  650 mg Oral Q4H PRN Norval Morton, MD       Or  . acetaminophen (TYLENOL) solution 650 mg  650 mg Per Tube Q4H PRN Norval Morton, MD       Or  . acetaminophen (TYLENOL) suppository 650 mg  650 mg Rectal Q4H PRN Tamala Julian, Rondell A, MD      . atorvastatin (LIPITOR) tablet 10 mg  10 mg Oral QPM Smith, Rondell A, MD   10 mg at 06/15/17 1737  . carbidopa-levodopa (SINEMET IR) 25-100 MG per tablet immediate release 1 tablet  1 tablet Oral TID Norval Morton, MD   1 tablet at 06/15/17 2152  . carvedilol (COREG) tablet 25 mg  25 mg Oral BID Fuller Plan A, MD   25 mg at 06/15/17 2154  . furosemide (LASIX) tablet 40 mg  40 mg Oral BID Dessa Phi, DO   40 mg at 06/15/17 1737  . insulin aspart (novoLOG) injection 0-15 Units  0-15 Units Subcutaneous TID WC Norval Morton, MD   2 Units at 06/15/17 1737  . insulin glargine (LANTUS) injection 36 Units  36 Units Subcutaneous QHS Dessa Phi, DO   36 Units at 06/15/17 2152  .  irbesartan (AVAPRO) tablet 300 mg  300 mg Oral Daily Tamala Julian, Rondell A, MD   300 mg at 06/15/17 1450  . isosorbide mononitrate (IMDUR) 24 hr tablet 30 mg  30 mg Oral Daily Tamala Julian, Rondell A, MD   30 mg at 06/15/17 1156  . lamoTRIgine (LAMICTAL) tablet 100 mg  100 mg Oral BID Kerney Elbe, MD   100 mg at 06/15/17 2152  . lipase/protease/amylase (CREON) capsule 24,000 Units  24,000 Units Oral Q lunch Dessa Phi, DO   24,000 Units at 06/15/17 1450  . lipase/protease/amylase (CREON) capsule 48,000 Units  48,000 Units Oral 2 times per day Dessa Phi, DO   48,000 Units at 06/15/17 1737  . LORazepam (ATIVAN) injection 1-2 mg  1-2 mg Intravenous Q2H PRN Smith, Rondell A, MD      . metolazone (ZAROXOLYN) tablet 2.5 mg  2.5 mg Oral Q Sat Smith, Rondell A, MD   2.5 mg at 06/15/17 1450  . polyethylene glycol-electrolytes (NuLYTELY/GoLYTELY) solution 4,000 mL  4,000 mL Oral Once [name], MD      . potassium chloride SA (K-DUR,KLOR-CON) CR tablet 40 mEq  40 mEq Oral Daily Fuller Plan A, MD   40 mEq at 06/15/17 1156  . pregabalin (LYRICA) capsule 100 mg  100 mg Oral BID Fuller Plan A, MD   100 mg at 06/15/17 2152  . sertraline (ZOLOFT) tablet 50 mg  50 mg Oral QPM Smith, Rondell A, MD   50 mg at 06/15/17 1738    Allergies as of 06/15/2017 - Review Complete 06/15/2017  Allergen Reaction Noted  . Penicillins Hives 10/26/2010  . Sulfa antibiotics Hives 10/26/2010  . Codeine  10/26/2010  . Other Rash 05/18/2012    Family History  Problem Relation Age of Onset  . Allergies Father   . Heart disease Father        before 49  . Heart failure Father   . Hypertension Father   . Hyperlipidemia Father   . Heart disease Mother   . Diabetes Mother   . Hypertension Mother   . Hyperlipidemia Mother   . Hypertension Sister   . Heart disease Sister        before 2  . Hyperlipidemia Sister   . Diabetes Brother   . Hypertension Brother   . Diabetes Sister   . Hypertension Sister   . Diabetes  Son   . Hypertension Son   . Cancer Other     Social History   Socioeconomic History  . Marital status: Divorced    Spouse name: Not on file  . Number of children: Not on file  . Years of education: Not on file  . Highest education level: Not on file  Social Needs  . Financial resource strain: Not on file  . Food insecurity - worry: Not on file  . Food insecurity - inability: Not on file  . Transportation needs - medical: Not on file  . Transportation needs - non-medical: Not on file  Occupational History  . Occupation: Disabled  Tobacco Use  . Smoking status: Former Smoker    Packs/day: 0.50    Years: 25.00    Pack years: 12.50    Types: Cigarettes  . Smokeless tobacco: Never Used  . Tobacco comment: quit smoking 20+ytrs ago  Substance and Sexual Activity  . Alcohol use: No  . Drug use: No  . Sexual activity: No    Birth control/protection: Surgical  Other Topics Concern  . Not on file  Social History Narrative  She lives in [place]. She has lots of family in the area and is accompanied by her sister.   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   She has a distant history of smoking, quit 20 years ago.    Review of Systems: Positive for: GI: Described in detail in HPI.    Gen: Denies any fever, chills, rigors, night sweats, anorexia, fatigue, weakness, malaise, involuntary weight loss, and sleep disorder CV: Denies chest pain, angina, palpitations, syncope, orthopnea, PND, peripheral edema, and claudication. Resp: dyspnea, Denies cough, sputum, wheezing, coughing up blood. GU : Denies urinary burning, blood in urine, urinary frequency, urinary hesitancy, nocturnal urination, and urinary incontinence. MS: Denies joint pain or swelling.  Denies muscle weakness, cramps, atrophy.  Derm: Denies rash, itching, oral ulcerations, hives, unhealing ulcers.  Psych: Denies depression, anxiety, memory loss, suicidal ideation, hallucinations,   and confusion. Heme:  Rectal bleeding,Denies bruising, and enlarged lymph nodes. Neuro:  Denies any headaches, dizziness, paresthesias. Endo:  Denies any problems with DM, thyroid, adrenal function.  Physical Exam: Vital signs in last 24 hours: Temp:  [97.9 F (36.6 C)-98.8 F (37.1 C)] 97.9 F (36.6 C) (12/23 0530) Pulse Rate:  [55-66] 56 (12/23 0530) Resp:  [15-20] 16 (12/23 0530) BP: (106-135)/(43-62) 106/43 (12/23 0530) SpO2:  [97 %-100 %] 98 % (12/23 0530) Weight:  [131 kg (288 lb 12.8 oz)] 131 kg (288 lb 12.8 oz) (12/23 0500) Last BM Date: 06/15/17  General:   Alert,  overweight, pleasant and cooperative in NAD Head:  Normocephalic and atraumatic. Eyes:  Sclera clear, no icterus.   Mild pallor Ears:  Normal auditory acuity. Nose:  No deformity, discharge,  or lesions. Mouth:  No deformity or lesions.  Oropharynx pink & moist. Neck:  Supple; no masses or thyromegaly. Lungs: on oxygen via nasal cannula Clear throughout to auscultation.   No wheezes, crackles, or rhonchi. No acute distress. Heart:  Regular rate and rhythm; no murmurs, clicks, rubs,  or gallops. Extremities:  Without clubbing or edema. Neurologic:  Alert and  oriented x4;  grossly normal neurologically. Skin:  Intact without significant lesions or rashes. Psych:  Alert and cooperative. Normal mood and affect. Abdomen:  Soft, nontender and nondistended. No masses, hepatosplenomegaly or hernias noted. Normal bowel sounds, without guarding, and without rebound.         Lab Results: Recent Labs    06/15/17 0355 06/15/17 0411 06/15/17 1315 06/16/17 0019  WBC 5.5  --   --  5.3  HGB 12.3 13.9 10.2* 9.8*  9.7*  HCT 37.9 41.0 33.3* 32.4*  32.1*  PLT 259  --   --  224   BMET Recent Labs    06/15/17 0355 06/15/17 0411 06/16/17 0019  NA 137 140 137  K 4.1 4.0 3.7  CL 104 103 104  CO2 23  --  29  GLUCOSE 107* 110* 101*  BUN _0 CREATININE 1.04* 1.00 1.00  CALCIUM 8.9  --  8.2*   LFT Recent  Labs    06/15/17 0355  PROT 8.1  ALBUMIN 4.1  AST 35  ALT 21  ALKPHOS 122  BILITOT 0.7   PT/INR Recent Labs    06/15/17 0355  LABPROT 14.7  INR 1.16    Studies/Results: Dg Chest 2 View  Result Date: 06/15/2017 CLINICAL DATA:  Pt arrived via EMS from home with c/o HA, LSN 2200, pt awoke @ 0200 with HA, dizziness and blurred vision. L sided weakness. Hx of  CHF, MI, diastolic heart failure, diabetes, HTN, PNA EXAM: CHEST  2 VIEW COMPARISON:  03/04/2017 FINDINGS: Cardiac silhouette is mildly enlarged. No mediastinal hilar masses. There is prominence of the hila bilaterally which appears vascular. Central vascular congestion. Lungs otherwise clear with no overt pulmonary edema. No pleural effusion or pneumothorax. Skeletal structures are grossly intact. IMPRESSION: 1. No acute cardiopulmonary disease. 2. Mild cardiomegaly and central vascular congestion similar to the prior study. Electronically Signed   By: Lajean Manes M.D.   On: 06/15/2017 07:22   Mr Brain Wo Contrast  Result Date: 06/15/2017 CLINICAL DATA:  Dizziness and blurred vision.  Left-sided weakness. EXAM: MRI HEAD WITHOUT CONTRAST TECHNIQUE: Multiplanar, multiecho pulse sequences of the brain and surrounding structures were obtained without intravenous contrast. COMPARISON:  1. Brain MRI 08/05/2011 2. Head CT 04/29/2012 3. Head CT 06/15/2017 FINDINGS: Brain: There is no acute infarct or acute hemorrhage. No mass lesion, hydrocephalus, dural abnormality or extra-axial collection. Old infarct of the right frontal lobe precentral gyrus corresponds to the hypodensity seen on the CT. Otherwise mild multifocal white matter hyperintensity consistent with chronic small vessel disease. The right parietal infarct is new compared to 08/05/2011. No chronic microhemorrhage or superficial siderosis. Vascular: Major intracranial arterial and venous sinus flow voids are preserved. Skull and upper cervical spine: The visualized skull base,  calvarium, upper cervical spine and extracranial soft tissues are normal. Sinuses/Orbits: No fluid levels or advanced mucosal thickening. No mastoid or middle ear effusion. Normal orbits. IMPRESSION: 1. No acute ischemia. 2. 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 Electronically Signed   By: Ulyses Jarred M.D.   On: 06/15/2017 05:09   Ct Head Code Stroke Wo Contrast  Result Date: 06/15/2017 CLINICAL DATA:  Code stroke.  Left-sided weakness EXAM: CT HEAD WITHOUT CONTRAST TECHNIQUE: Contiguous axial images were obtained from the base of the skull through the vertex without intravenous contrast. COMPARISON:  Head CT 04/29/2012 FINDINGS: Brain: No mass lesion or acute hemorrhage. Faint focus of hypoattenuation fall within the right precentral gyrus, at the site of the hand motor area. Otherwise no focal parenchymal density abnormality. No hydrocephalus or age advanced atrophy. Incidentally noted partially empty sella. Vascular: No hyperdense vessel. No advanced atherosclerotic calcification of the arteries at the skull base. Skull: Normal visualized skull base, calvarium and extracranial soft tissues. Sinuses/Orbits: No sinus fluid levels or advanced mucosal thickening. No mastoid effusion. Normal orbits. ASPECTS [facility] Stroke Program Early CT Score) - Ganglionic level infarction (caudate, lentiform nuclei, internal capsule, insula, M1-M3 cortex): 6 - Supraganglionic infarction (M4-M6 cortex): 3 Total score (0-10 with 10 being normal): 10 IMPRESSION: 1. No acute hemorrhage or mass lesion. 2. Mild hypoattenuation within the right precentral gyrus is age indeterminate and could be an early finding of ischemia. 3. ASPECTS is 9. These results were communicated to Dr. Karena Addison Aroor at 4:24 am on 06/15/2017 by text page via the [facility] messaging system. Electronically Signed   By: Ulyses Jarred M.D.   On: 06/15/2017 04:24    Impression: 1.Painless  hematochezia, Hemoglobin 12.3-13.9 on admission,has dropped down to 9.7,microcytosis MCV 75.9, normal platelets Normal BUN/creatinine ratio,normal PT/INR  2.Possible seizure with postictal Todd's paresis and postictal headache as per neurology evaluation 3.Chronic pancreatitis,on pancreatic supplements   Plan:  Likely diverticular bleed, less likely ischemic colitis as patient had no abdominal pain. Discussed about the risk and benefits of the procedure with the patient. She understands and verbalizes consent, she will be kept on clear liquid  diet nothing by mouth post midnight and will be given split prep for colonoscopy to be performed in a.m..   LOS: 0 days   [name], M.D.  06/16/2017, 8:28 AM  Pager [phone] If no answer or after 5 PM call [phone]

## 2017-06-16 NOTE — Progress Notes (Signed)
PROGRESS NOTE    [name]   DTO:[id number]  DOB: 1945-12-17  DOA: 06/15/2017 PCP: [name], MD   Brief Narrative:  [name] is a 71 y.o. female with medical history significant of HTN, HLD, CAD, chronic diastolic CHF, CVA, DM type II, seizure disorder, obesity hypoventilation syndrome who present for multiple neurological complaints after waking up at 2:30 AM- difficultly moving right leg and arm, dizziness, headache.  She also has had bloody stools for a few days now- bright red blood- no abdominal pain.  Lasix recently increased by her cardiology a couple of days ago due to pedal edema, orthopnea and weight gain.   Subjective: Still having bloody BMs. Feels a little light headed. No other complaints.  ROS: no complaints of nausea, vomiting, constipation diarrhea, cough, dyspnea or dysuria. No other complaints.   Assessment & Plan:   Principal Problem:   Stroke-like symptoms- seizure disorder - MRI is negative except fo r encephalomalacia from an old infarct - EEG is normal - ? Seizure during sleepy- Lamotrigine changed from 150 mg daily to to BID 100 mg  GI bleed - bright red blood per rectum daily for past few days - hemoccult + - GI consulted  Acute blood loss anemia - Hb 13 >> 9.7- f/u anemia panel - f/u for need to transfuse    Chronic diastolic congestive heart failure  - stable- is on lasix - continue for now    Type II diabetes mellitus, uncontrolled  - Lantus and Novolog  HTN - Avapro, Imdur, Coreg  Chronic pancreatitis - cont Creon   DVT prophylaxis: SCDs Code Status: Full code Family Communication:  Disposition Plan: home when stable Consultants:   GI Procedures:   2 D ECHO Left ventricle: The cavity size was normal. There was mild   concentric hypertrophy. Systolic function was normal. The   estimated ejection fraction was in the range of 55% to 60%. Wall   motion was normal; there were no regional wall  motion   abnormalities. Features are consistent with a pseudonormal left   ventricular filling pattern, with concomitant abnormal relaxation   and increased filling pressure (grade 2 diastolic dysfunction). - Ventricular septum: The contour showed diastolic flattening. - Aortic valve: Trileaflet; mildly thickened, mildly calcified   leaflets. - Left atrium: The atrium was moderately dilated.   Anterior-posterior dimension: 47 mm. - Right ventricle: The cavity size was mildly dilated. Wall   thickness was normal. - Right atrium: The atrium was moderately dilated. - Tricuspid valve: There was mild regurgitation. Antimicrobials:  Anti-infectives (From admission, onward)   None       Objective: Vitals:   06/16/17 0609 06/16/17 0846 06/16/17 1305 06/16/17 1600  BP:  (!) 142/55 (!) 117/52 (!) 108/54  Pulse:  (!) 58 (!) 55 (!) 56  Resp:  20 20 19   Temp:  98.1 F (36.7 C) 97.6 F (36.4 C) 98.2 F (36.8 C)  TempSrc:  Oral Oral Oral  SpO2:  98% 100% 99%  Weight:      Height: 5\' 1"  (1.549 m)       Intake/Output Summary (Last 24 hours) at 06/16/2017 1716 Last data filed at 06/16/2017 0530 Gross per 24 hour  Intake -  Output 875 ml  Net -875 ml   Filed Weights   06/15/17 0433 06/16/17 0500  Weight: 130.3 kg (287 lb 4.2 oz) 131 kg (288 lb 12.8 oz)    Examination: General exam: Appears comfortable  HEENT: PERRLA, oral  mucosa moist, no sclera icterus or thrush Respiratory system: Clear to auscultation. Respiratory effort normal. Cardiovascular system: S1 & S2 heard, RRR.  No murmurs  Gastrointestinal system: Abdomen soft, non-tender, nondistended. Normal bowel sound. No organomegaly Central nervous system: Alert and oriented. No focal neurological deficits. Extremities: No cyanosis, clubbing or edema Skin: No rashes or ulcers Psychiatry:  Mood & affect appropriate.     Data Reviewed: I have personally reviewed following labs and imaging studies  CBC: Recent Labs  Lab  06/15/17 0355 06/15/17 0411 06/15/17 1315 06/16/17 0019 06/16/17 1201  WBC 5.5  --   --  5.3  --   NEUTROABS 2.7  --   --   --   --   HGB 12.3 13.9 10.2* 9.8*  9.7* 10.9*  HCT 37.9 41.0 33.3* 32.4*  32.1* 36.7  MCV 75.2*  --   --  75.9*  --   PLT 259  --   --  224  --    Basic Metabolic Panel: Recent Labs  Lab 06/15/17 0355 06/15/17 0411 06/16/17 0019  NA 137 140 137  K 4.1 4.0 3.7  CL 104 103 104  CO2 23  --  29  GLUCOSE 107* 110* 101*  BUN 16 19 12   CREATININE 1.04* 1.00 1.00  CALCIUM 8.9  --  8.2*   GFR: Estimated Creatinine Clearance: 68 mL/min (by C-G formula based on SCr of 1 mg/dL). Liver Function Tests: Recent Labs  Lab 06/15/17 0355  AST 35  ALT 21  ALKPHOS 122  BILITOT 0.7  PROT 8.1  ALBUMIN 4.1   No results for input(s): LIPASE, AMYLASE in the last 168 hours. No results for input(s): AMMONIA in the last 168 hours. Coagulation Profile: Recent Labs  Lab 06/15/17 0355  INR 1.16   Cardiac Enzymes: No results for input(s): CKTOTAL, CKMB, CKMBINDEX, TROPONINI in the last 168 hours. BNP (last 3 results) No results for input(s): PROBNP in the last 8760 hours. HbA1C: No results for input(s): HGBA1C in the last 72 hours. CBG: Recent Labs  Lab 06/15/17 1644 06/15/17 2150 06/16/17 0650 06/16/17 1134 06/16/17 1642  GLUCAP 130* 96 75 126* 71   Lipid Profile: No results for input(s): CHOL, HDL, LDLCALC, TRIG, CHOLHDL, LDLDIRECT in the last 72 hours. Thyroid Function Tests: No results for input(s): TSH, T4TOTAL, FREET4, T3FREE, THYROIDAB in the last 72 hours. Anemia Panel: No results for input(s): VITAMINB12, FOLATE, FERRITIN, TIBC, IRON, RETICCTPCT in the last 72 hours. Urine analysis:    Component Value Date/Time   COLORURINE YELLOW 03/04/2017 1520   APPEARANCEUR HAZY (A) 03/04/2017 1520   LABSPEC 1.001 (L) 03/04/2017 1520   PHURINE 5.0 03/04/2017 1520   GLUCOSEU >=500 (A) 03/04/2017 1520   GLUCOSEU NEGATIVE 04/17/2016 0955   HGBUR  NEGATIVE 03/04/2017 1520   BILIRUBINUR NEGATIVE 03/04/2017 1520   KETONESUR NEGATIVE 03/04/2017 1520   PROTEINUR NEGATIVE 03/04/2017 1520   UROBILINOGEN 0.2 04/17/2016 0955   NITRITE NEGATIVE 03/04/2017 1520   LEUKOCYTESUR NEGATIVE 03/04/2017 1520   Sepsis Labs: @LABRCNTIP (procalcitonin:4,lacticidven:4) )No results found for this or any previous visit (from the past 240 hour(s)).       Radiology Studies: Dg Chest 2 View  Result Date: 06/15/2017 CLINICAL DATA:  Pt arrived via EMS from home with c/o HA, LSN 2200, pt awoke @ 0200 with HA, dizziness and blurred vision. L sided weakness. Hx of CHF, MI, diastolic heart failure, diabetes, HTN, PNA EXAM: CHEST  2 VIEW COMPARISON:  03/04/2017 FINDINGS: Cardiac silhouette is mildly enlarged. No mediastinal  hilar masses. There is prominence of the hila bilaterally which appears vascular. Central vascular congestion. Lungs otherwise clear with no overt pulmonary edema. No pleural effusion or pneumothorax. Skeletal structures are grossly intact. IMPRESSION: 1. No acute cardiopulmonary disease. 2. Mild cardiomegaly and central vascular congestion similar to the prior study. Electronically Signed   By: Lajean Manes M.D.   On: 06/15/2017 07:22   Mr Brain Wo Contrast  Result Date: 06/15/2017 CLINICAL DATA:  Dizziness and blurred vision.  Left-sided weakness. EXAM: MRI HEAD WITHOUT CONTRAST TECHNIQUE: Multiplanar, multiecho pulse sequences of the brain and surrounding structures were obtained without intravenous contrast. COMPARISON:  1. Brain MRI 08/05/2011 2. Head CT 04/29/2012 3. Head CT 06/15/2017 FINDINGS: Brain: There is no acute infarct or acute hemorrhage. No mass lesion, hydrocephalus, dural abnormality or extra-axial collection. Old infarct of the right frontal lobe precentral gyrus corresponds to the hypodensity seen on the CT. Otherwise mild multifocal white matter hyperintensity consistent with chronic small vessel disease. The right parietal  infarct is new compared to 08/05/2011. No chronic microhemorrhage or superficial siderosis. Vascular: Major intracranial arterial and venous sinus flow voids are preserved. Skull and upper cervical spine: The visualized skull base, calvarium, upper cervical spine and extracranial soft tissues are normal. Sinuses/Orbits: No fluid levels or advanced mucosal thickening. No mastoid or middle ear effusion. Normal orbits. IMPRESSION: 1. No acute ischemia. 2. 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 Electronically Signed   By: Ulyses Jarred M.D.   On: 06/15/2017 05:09   Ct Head Code Stroke Wo Contrast  Result Date: 06/15/2017 CLINICAL DATA:  Code stroke.  Left-sided weakness EXAM: CT HEAD WITHOUT CONTRAST TECHNIQUE: Contiguous axial images were obtained from the base of the skull through the vertex without intravenous contrast. COMPARISON:  Head CT 04/29/2012 FINDINGS: Brain: No mass lesion or acute hemorrhage. Faint focus of hypoattenuation fall within the right precentral gyrus, at the site of the hand motor area. Otherwise no focal parenchymal density abnormality. No hydrocephalus or age advanced atrophy. Incidentally noted partially empty sella. Vascular: No hyperdense vessel. No advanced atherosclerotic calcification of the arteries at the skull base. Skull: Normal visualized skull base, calvarium and extracranial soft tissues. Sinuses/Orbits: No sinus fluid levels or advanced mucosal thickening. No mastoid effusion. Normal orbits. ASPECTS [facility] Stroke Program Early CT Score) - Ganglionic level infarction (caudate, lentiform nuclei, internal capsule, insula, M1-M3 cortex): 6 - Supraganglionic infarction (M4-M6 cortex): 3 Total score (0-10 with 10 being normal): 10 IMPRESSION: 1. No acute hemorrhage or mass lesion. 2. Mild hypoattenuation within the right precentral gyrus is age indeterminate and could be an early finding of ischemia. 3. ASPECTS is 9. These  results were communicated to Dr. Karena Addison Aroor at 4:24 am on 06/15/2017 by text page via the [facility] messaging system. Electronically Signed   By: Ulyses Jarred M.D.   On: 06/15/2017 04:24      Scheduled Meds: .  stroke: mapping our early stages of recovery book   Does not apply Once  . atorvastatin  10 mg Oral QPM  . carbidopa-levodopa  1 tablet Oral TID  . carvedilol  25 mg Oral BID  . furosemide  40 mg Oral BID  . insulin aspart  0-15 Units Subcutaneous TID WC  . insulin glargine  36 Units Subcutaneous QHS  . irbesartan  300 mg Oral Daily  . isosorbide mononitrate  30 mg Oral Daily  . lamoTRIgine  100 mg Oral BID  . lipase/protease/amylase  24,000 Units  Oral Q lunch  . lipase/protease/amylase  48,000 Units Oral 2 times per day  . metolazone  2.5 mg Oral Q Sat  . potassium chloride SA  40 mEq Oral Daily  . pregabalin  100 mg Oral BID  . sertraline  50 mg Oral QPM   Continuous Infusions:   LOS: 0 days    Time spent in minutes: 35    [name] Odea, MD [facility] Pager: [email or web address] Password [facility] 06/16/2017, 5:16 PM

## 2017-06-16 NOTE — Progress Notes (Signed)
  Echocardiogram 2D Echocardiogram has been performed.  [name] M 06/16/2017, 9:02 AM

## 2017-06-16 NOTE — Evaluation (Signed)
Occupational Therapy Evaluation Patient Details Name: [name] MRN: [id number] DOB: 02/16/1946 Today's Date: 06/16/2017    History of Present Illness Pt is a 71 y/o female admitted secondary to blurred vision and left sided weakness. MRI was negative for any acute findings. PMH including but not limited to HTN, HLD, CAD, chronic diastolic CHF, CVA, DM type II and seizure disorder.   Clinical Impression   PTA, pt was living with her sister, brother in law, and son. Pt requiring assists for socks and shoes management at home which her son assisted with and she performed light IADLs including cooking and cleaning. Pt currently performing UB ADLs with Min A, LB ADLs with Mod A, and functional mobility with Min A and single hand held A. Pt presenting with weakness in LUE, double vision, decreased balance, and poor activity tolerance. Pt agreeable and motivated to participate in therapy. Pt would benefit from further acute OT to facilitate safe dc. Recommend dc with HHOT to optimize safety and independence with ADLs, IADLs, and functional mobility as well as decreased fall risk.     Follow Up Recommendations  Home health OT;Supervision/Assistance - 24 hour    Equipment Recommendations  None recommended by OT    Recommendations for Other Services PT consult     Precautions / Restrictions Precautions Precautions: Fall Precaution Comments: watch SPO2 Restrictions Weight Bearing Restrictions: No      Mobility Bed Mobility Overal bed mobility: Needs Assistance Bed Mobility: Supine to Sit;Sit to Supine     Supine to sit: Min guard Sit to supine: Min guard   General bed mobility comments: increased time and effort, min guard for safety  Transfers Overall transfer level: Needs assistance Equipment used: 2 person hand held assist Transfers: Sit to/from Stand Sit to Stand: Min assist;+2 safety/equipment         General transfer comment: increased time and effort, min A x2  for safety and stability with transitional movement    Balance Overall balance assessment: Needs assistance Sitting-balance support: Feet supported Sitting balance-Leahy Scale: Fair     Standing balance support: During functional activity;Single extremity supported Standing balance-Leahy Scale: Poor Standing balance comment: reliant on at least one UE support                           ADL either performed or assessed with clinical judgement   ADL Overall ADL's : Needs assistance/impaired Eating/Feeding: Set up;Sitting   Grooming: Wash/dry hands;Min guard;Standing Grooming Details (indicate cue type and reason): Pt performing hand hygiene at sink with Min guard for safety Upper Body Bathing: Minimal assistance;Sitting   Lower Body Bathing: Moderate assistance;Sit to/from stand   Upper Body Dressing : Minimal assistance;Sitting   Lower Body Dressing: Moderate assistance;Sit to/from stand   Toilet Transfer: Min guard;Ambulation;BSC(BSC over toilet) Toilet Transfer Details (indicate cue type and reason): min guard for safety. Pt with single hand held A for funcitonal mobility to bathroom and then [employer] for transfer to3N1 over toilet Toileting- Clothing Manipulation and Hygiene: Minimal assistance;Sit to/from stand Toileting - Clothing Manipulation Details (indicate cue type and reason): Pt able to perform peri care and clothing managmenet. Needing Min A to clean back side.     Functional mobility during ADLs: Minimal assistance(Single hand held A) General ADL Comments: Pt demonstrating decreased balance, activity tolerance, and LUE strength as well as double vision. Pt SpO2 dropping to 81% on RA during fucnitonal mobility and toileting. educated pt on purse lip breathing  and SpO2 elevated to low 90s. Placed pt back on 2L O2 via n/c     Vision Baseline Vision/History: Wears glasses Wears Glasses: At all times Patient Visual Report: Diplopia Vision Assessment?:  Yes Eye Alignment: Within Functional Limits Diplopia Assessment: Disappears with one eye closed Additional Comments: No glasses in room. Glasses home. L eye dominant. Pt double vision goes away when closing either eye.      Perception     Praxis      Pertinent Vitals/Pain Pain Assessment: No/denies pain     Hand Dominance Right   Extremity/Trunk Assessment Upper Extremity Assessment Upper Extremity Assessment: LUE deficits/detail LUE Deficits / Details: L sided weakness with decreased grasp strength. Pt slow for finger oppositing for Bil hands.  LUE Coordination: decreased gross motor;decreased fine motor   Lower Extremity Assessment Lower Extremity Assessment: Generalized weakness   Cervical / Trunk Assessment Cervical / Trunk Assessment: Other exceptions Cervical / Trunk Exceptions: Increased body habitus and back surgery two years ago (April)   Communication Communication Communication: No difficulties   Cognition Arousal/Alertness: Awake/alert Behavior During Therapy: WFL for tasks assessed/performed Overall Cognitive Status: Within Functional Limits for tasks assessed                                 General Comments: cognition not formally assessed but [facility] for general conversation   General Comments       Exercises     Shoulder Instructions      Home Living Family/patient expects to be discharged to:: Private residence Living Arrangements: Other (Comment)(with sister and son) Available Help at Discharge: Family;Available 24 hours/day Type of Home: House Home Access: Stairs to enter [employer] of Steps: 2 Entrance Stairs-Rails: Right;Left Home Layout: One level     Bathroom Shower/Tub: Tub/shower unit;Curtain   Bathroom Toilet: Standard(BSC over toilet)     Home Equipment: Cane - single point;Bedside commode          Prior Functioning/Environment Level of Independence: Independent with assistive device(s);Needs  assistance  Gait / Transfers Assistance Needed: ambulates with use of SPC.  ADL's / Homemaking Assistance Needed: Son will assist with donning socks and shoes. Pt able to don pants with increased time and effort. Does simple cooking, "I do the morning cooking and my sister does the evening cooking."   Comments: Pt stating she lives with her sister, brother in law, and son. Reports that she and her sister help eachother as needed and either are in good shape.         OT Problem List: Decreased strength;Impaired balance (sitting and/or standing);Decreased activity tolerance;Impaired vision/perception;Decreased knowledge of use of DME or AE;Decreased knowledge of precautions;Pain      OT Treatment/Interventions: Self-care/ADL training;Therapeutic exercise;Energy conservation;DME and/or AE instruction;Therapeutic activities;Patient/family education    OT Goals(Current goals can be found in the care plan section) Acute Rehab OT Goals Patient Stated Goal: return home OT Goal Formulation: With patient Time For Goal Achievement: 06/30/17 Potential to Achieve Goals: Good ADL Goals Pt Will Perform Grooming: with set-up;with supervision;standing Pt Will Perform Upper Body Dressing: with set-up;with supervision;sitting Pt Will Perform Lower Body Dressing: with set-up;with supervision;sit to/from stand;with adaptive equipment Pt Will Transfer to Toilet: ambulating;bedside commode;with modified independence Pt Will Perform Toileting - Clothing Manipulation and hygiene: with set-up;with supervision;sit to/from stand;with adaptive equipment Additional ADL Goal #1: Pt will verbalize 3 energy conservation stratgies with Min VCs.  OT Frequency: Min 2X/week   Barriers to  D/C:            Co-evaluation              AM-PAC PT "6 Clicks" Daily Activity     Outcome Measure Help from another person eating meals?: None Help from another person taking care of personal grooming?: A Little Help from  another person toileting, which includes using toliet, bedpan, or urinal?: A Little Help from another person bathing (including washing, rinsing, drying)?: A Lot Help from another person to put on and taking off regular upper body clothing?: A Little Help from another person to put on and taking off regular lower body clothing?: A Lot 6 Click Score: 17   End of Session Equipment Utilized During Treatment: Gait belt;Oxygen(2L) Nurse Communication: Mobility status  Activity Tolerance: Patient tolerated treatment well Patient left: in chair;with call bell/phone within reach  OT Visit Diagnosis: Unsteadiness on feet (R26.81);Other abnormalities of gait and mobility (R26.89);Muscle weakness (generalized) (M62.81);Hemiplegia and hemiparesis Hemiplegia - Right/Left: Left Hemiplegia - dominant/non-dominant: Non-Dominant Hemiplegia - caused by: Unspecified                Time: 9191-6606 OT Time Calculation (min): 49 min Charges:  OT General Charges $OT Visit: 1 Visit OT Evaluation $OT Eval Moderate Complexity: 1 Mod OT Treatments $Self Care/Home Management : 23-37 mins G-Codes: OT G-codes **NOT FOR INPATIENT CLASS** Functional Assessment Tool Used: Clinical judgement Functional Limitation: Self care Self Care Current Status (Y0459): At least 40 percent but less than 60 percent impaired, limited or restricted Self Care Goal Status (X7741): At least 20 percent but less than 40 percent impaired, limited or restricted   [place], OTR/L Acute Rehab Pager: [phone] Office: [place] 06/16/2017, 11:43 AM

## 2017-06-16 NOTE — Progress Notes (Signed)
Subjective: Left sided weakness resolved yesterday.   Objective: Current vital signs: BP (!) 142/55 (BP Location: Left Arm)   Pulse (!) 58   Temp 98.1 F (36.7 C) (Oral)   Resp 20   Ht 5' 1"  (1.549 m)   Wt 131 kg (288 lb 12.8 oz)   SpO2 98%   BMI 54.57 kg/m  Vital signs in last 24 hours: Temp:  [97.9 F (36.6 C)-98.8 F (37.1 C)] 98.1 F (36.7 C) (12/23 0846) Pulse Rate:  [55-66] 58 (12/23 0846) Resp:  [15-20] 20 (12/23 0846) BP: (106-142)/(43-62) 142/55 (12/23 0846) SpO2:  [97 %-100 %] 98 % (12/23 0846) Weight:  [131 kg (288 lb 12.8 oz)] 131 kg (288 lb 12.8 oz) (12/23 0500)  Intake/Output from previous day: 12/22 0701 - 12/23 0700 In: -  Out: 937 [Urine:875] Intake/Output this shift: No intake/output data recorded. Nutritional status: Diet clear liquid Room service appropriate? Yes; Fluid consistency: Thin  Neurologic Exam: Mental Status: Alert, oriented, thought content appropriate.  Speech fluent without evidence of aphasia.  Able to follow all commands without difficulty. Cranial Nerves: II:  Visual fields grossly normal III,IV, VI: ptosis not present, extra-ocular motions intact bilaterally V,VII: smile symmetric, facial light touch sensation normal bilaterally VIII: hearing intact to voice IX,X: no hypophonia XI: symmetric XII: midline tongue extension  Motor: Right : Upper extremity   5/5    Left:     Upper extremity   5/5  Lower extremity   5/5     Lower extremity   5/5 Sensory: Unremarkable Deep Tendon Reflexes:  Unremarkable Cerebellar: No ataxia with FNF bilaterally Gait: Deferred   Lab Results: Results for orders placed or performed during the hospital encounter of 06/15/17 (from the past 48 hour(s))  Protime-INR     Status: None   Collection Time: 06/15/17  3:55 AM  Result Value Ref Range   Prothrombin Time 14.7 11.4 - 15.2 seconds   INR 1.16   APTT     Status: Abnormal   Collection Time: 06/15/17  3:55 AM  Result Value Ref Range   aPTT 40  (H) 24 - 36 seconds    Comment:        IF BASELINE aPTT IS ELEVATED, SUGGEST PATIENT RISK ASSESSMENT BE USED TO DETERMINE APPROPRIATE ANTICOAGULANT THERAPY.   CBC     Status: Abnormal   Collection Time: 06/15/17  3:55 AM  Result Value Ref Range   WBC 5.5 4.0 - 10.5 K/uL   RBC 5.04 3.87 - 5.11 MIL/uL   Hemoglobin 12.3 12.0 - 15.0 g/dL   HCT 37.9 36.0 - 46.0 %   MCV 75.2 (L) 78.0 - 100.0 fL   MCH 24.4 (L) 26.0 - 34.0 pg   MCHC 32.5 30.0 - 36.0 g/dL   RDW 17.0 (H) 11.5 - 15.5 %   Platelets 259 150 - 400 K/uL  Differential     Status: None   Collection Time: 06/15/17  3:55 AM  Result Value Ref Range   Neutrophils Relative % 48 %   Neutro Abs 2.7 1.7 - 7.7 K/uL   Lymphocytes Relative 35 %   Lymphs Abs 1.9 0.7 - 4.0 K/uL   Monocytes Relative 13 %   Monocytes Absolute 0.7 0.1 - 1.0 K/uL   Eosinophils Relative 4 %   Eosinophils Absolute 0.2 0.0 - 0.7 K/uL   Basophils Relative 0 %   Basophils Absolute 0.0 0.0 - 0.1 K/uL  Comprehensive metabolic panel     Status: Abnormal   Collection Time:  06/15/17  3:55 AM  Result Value Ref Range   Sodium 137 135 - 145 mmol/L   Potassium 4.1 3.5 - 5.1 mmol/L   Chloride 104 101 - 111 mmol/L   CO2 23 22 - 32 mmol/L   Glucose, Bld 107 (H) 65 - 99 mg/dL   BUN 16 6 - 20 mg/dL   Creatinine, Ser 1.04 (H) 0.44 - 1.00 mg/dL   Calcium 8.9 8.9 - 10.3 mg/dL   Total Protein 8.1 6.5 - 8.1 g/dL   Albumin 4.1 3.5 - 5.0 g/dL   AST 35 15 - 41 U/L   ALT 21 14 - 54 U/L   Alkaline Phosphatase 122 38 - 126 U/L   Total Bilirubin 0.7 0.3 - 1.2 mg/dL   GFR calc non Af Amer 54 (L) >60 mL/min   GFR calc Af Amer >60 >60 mL/min    Comment: (NOTE) The eGFR has been calculated using the CKD EPI equation. This calculation has not been validated in all clinical situations. eGFR's persistently <60 mL/min signify possible Chronic Kidney Disease.    Anion gap 10 5 - 15  Brain natriuretic peptide     Status: Abnormal   Collection Time: 06/15/17  3:55 AM  Result Value  Ref Range   B Natriuretic Peptide 290.1 (H) 0.0 - 100.0 pg/mL  I-stat troponin, ED     Status: None   Collection Time: 06/15/17  4:10 AM  Result Value Ref Range   Troponin i, poc 0.01 0.00 - 0.08 ng/mL   Comment 3            Comment: Due to the release kinetics of cTnI, a negative result within the first hours of the onset of symptoms does not rule out myocardial infarction with certainty. If myocardial infarction is still suspected, repeat the test at appropriate intervals.   I-Stat Chem 8, ED     Status: Abnormal   Collection Time: 06/15/17  4:11 AM  Result Value Ref Range   Sodium 140 135 - 145 mmol/L   Potassium 4.0 3.5 - 5.1 mmol/L   Chloride 103 101 - 111 mmol/L   BUN 19 6 - 20 mg/dL   Creatinine, Ser 1.00 0.44 - 1.00 mg/dL   Glucose, Bld 110 (H) 65 - 99 mg/dL   Calcium, Ion 1.00 (L) 1.15 - 1.40 mmol/L   TCO2 28 22 - 32 mmol/L   Hemoglobin 13.9 12.0 - 15.0 g/dL   HCT 41.0 36.0 - 46.0 %  CBG monitoring, ED     Status: Abnormal   Collection Time: 06/15/17  8:51 AM  Result Value Ref Range   Glucose-Capillary 106 (H) 65 - 99 mg/dL   Comment 1 Notify RN    Comment 2 Document in Chart   Hemoglobin and hematocrit, blood     Status: Abnormal   Collection Time: 06/15/17  1:15 PM  Result Value Ref Range   Hemoglobin 10.2 (L) 12.0 - 15.0 g/dL    Comment: REPEATED TO VERIFY DELTA CHECK NOTED    HCT 33.3 (L) 36.0 - 46.0 %  Glucose, capillary     Status: Abnormal   Collection Time: 06/15/17  4:44 PM  Result Value Ref Range   Glucose-Capillary 130 (H) 65 - 99 mg/dL   Comment 1 Notify RN    Comment 2 Document in Chart   Occult blood card to lab, stool RN will collect     Status: Abnormal   Collection Time: 06/15/17  8:40 PM  Result Value Ref Range  Fecal Occult Bld POSITIVE (A) NEGATIVE  Glucose, capillary     Status: None   Collection Time: 06/15/17  9:50 PM  Result Value Ref Range   Glucose-Capillary 96 65 - 99 mg/dL  Hemoglobin and hematocrit, blood     Status:  Abnormal   Collection Time: 06/16/17 12:19 AM  Result Value Ref Range   Hemoglobin 9.7 (L) 12.0 - 15.0 g/dL   HCT 32.1 (L) 36.0 - 46.0 %  CBC     Status: Abnormal   Collection Time: 06/16/17 12:19 AM  Result Value Ref Range   WBC 5.3 4.0 - 10.5 K/uL   RBC 4.27 3.87 - 5.11 MIL/uL   Hemoglobin 9.8 (L) 12.0 - 15.0 g/dL   HCT 32.4 (L) 36.0 - 46.0 %   MCV 75.9 (L) 78.0 - 100.0 fL   MCH 23.0 (L) 26.0 - 34.0 pg   MCHC 30.2 30.0 - 36.0 g/dL   RDW 16.8 (H) 11.5 - 15.5 %   Platelets 224 150 - 400 K/uL  Basic metabolic panel     Status: Abnormal   Collection Time: 06/16/17 12:19 AM  Result Value Ref Range   Sodium 137 135 - 145 mmol/L   Potassium 3.7 3.5 - 5.1 mmol/L   Chloride 104 101 - 111 mmol/L   CO2 29 22 - 32 mmol/L   Glucose, Bld 101 (H) 65 - 99 mg/dL   BUN 12 6 - 20 mg/dL   Creatinine, Ser 1.00 0.44 - 1.00 mg/dL   Calcium 8.2 (L) 8.9 - 10.3 mg/dL   GFR calc non Af Amer 56 (L) >60 mL/min   GFR calc Af Amer >60 >60 mL/min    Comment: (NOTE) The eGFR has been calculated using the CKD EPI equation. This calculation has not been validated in all clinical situations. eGFR's persistently <60 mL/min signify possible Chronic Kidney Disease.    Anion gap 4 (L) 5 - 15  Glucose, capillary     Status: None   Collection Time: 06/16/17  6:50 AM  Result Value Ref Range   Glucose-Capillary 75 65 - 99 mg/dL    No results found for this or any previous visit (from the past 240 hour(s)).  Lipid Panel No results for input(s): CHOL, TRIG, HDL, CHOLHDL, VLDL, LDLCALC in the last 72 hours.  Studies/Results: Dg Chest 2 View  Result Date: 06/15/2017 CLINICAL DATA:  Pt arrived via EMS from home with c/o HA, LSN 2200, pt awoke @ 0200 with HA, dizziness and blurred vision. L sided weakness. Hx of CHF, MI, diastolic heart failure, diabetes, HTN, PNA EXAM: CHEST  2 VIEW COMPARISON:  03/04/2017 FINDINGS: Cardiac silhouette is mildly enlarged. No mediastinal hilar masses. There is prominence of the  hila bilaterally which appears vascular. Central vascular congestion. Lungs otherwise clear with no overt pulmonary edema. No pleural effusion or pneumothorax. Skeletal structures are grossly intact. IMPRESSION: 1. No acute cardiopulmonary disease. 2. Mild cardiomegaly and central vascular congestion similar to the prior study. Electronically Signed   By: Lajean Manes M.D.   On: 06/15/2017 07:22   Mr Brain Wo Contrast  Result Date: 06/15/2017 CLINICAL DATA:  Dizziness and blurred vision.  Left-sided weakness. EXAM: MRI HEAD WITHOUT CONTRAST TECHNIQUE: Multiplanar, multiecho pulse sequences of the brain and surrounding structures were obtained without intravenous contrast. COMPARISON:  1. Brain MRI 08/05/2011 2. Head CT 04/29/2012 3. Head CT 06/15/2017 FINDINGS: Brain: There is no acute infarct or acute hemorrhage. No mass lesion, hydrocephalus, dural abnormality or extra-axial collection. Old infarct of  the right frontal lobe precentral gyrus corresponds to the hypodensity seen on the CT. Otherwise mild multifocal white matter hyperintensity consistent with chronic small vessel disease. The right parietal infarct is new compared to 08/05/2011. No chronic microhemorrhage or superficial siderosis. Vascular: Major intracranial arterial and venous sinus flow voids are preserved. Skull and upper cervical spine: The visualized skull base, calvarium, upper cervical spine and extracranial soft tissues are normal. Sinuses/Orbits: No fluid levels or advanced mucosal thickening. No mastoid or middle ear effusion. Normal orbits. IMPRESSION: 1. No acute ischemia. 2. 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 Electronically Signed   By: Ulyses Jarred M.D.   On: 06/15/2017 05:09   Ct Head Code Stroke Wo Contrast  Result Date: 06/15/2017 CLINICAL DATA:  Code stroke.  Left-sided weakness EXAM: CT HEAD WITHOUT CONTRAST TECHNIQUE: Contiguous axial images were obtained  from the base of the skull through the vertex without intravenous contrast. COMPARISON:  Head CT 04/29/2012 FINDINGS: Brain: No mass lesion or acute hemorrhage. Faint focus of hypoattenuation fall within the right precentral gyrus, at the site of the hand motor area. Otherwise no focal parenchymal density abnormality. No hydrocephalus or age advanced atrophy. Incidentally noted partially empty sella. Vascular: No hyperdense vessel. No advanced atherosclerotic calcification of the arteries at the skull base. Skull: Normal visualized skull base, calvarium and extracranial soft tissues. Sinuses/Orbits: No sinus fluid levels or advanced mucosal thickening. No mastoid effusion. Normal orbits. ASPECTS [facility] Stroke Program Early CT Score) - Ganglionic level infarction (caudate, lentiform nuclei, internal capsule, insula, M1-M3 cortex): 6 - Supraganglionic infarction (M4-M6 cortex): 3 Total score (0-10 with 10 being normal): 10 IMPRESSION: 1. No acute hemorrhage or mass lesion. 2. Mild hypoattenuation within the right precentral gyrus is age indeterminate and could be an early finding of ischemia. 3. ASPECTS is 9. These results were communicated to Dr. Karena Addison Aroor at 4:24 am on 06/15/2017 by text page via the [facility] messaging system. Electronically Signed   By: Ulyses Jarred M.D.   On: 06/15/2017 04:24    Medications:  Scheduled: .  stroke: mapping our early stages of recovery book   Does not apply Once  . atorvastatin  10 mg Oral QPM  . carbidopa-levodopa  1 tablet Oral TID  . carvedilol  25 mg Oral BID  . furosemide  40 mg Oral BID  . insulin aspart  0-15 Units Subcutaneous TID WC  . insulin glargine  36 Units Subcutaneous QHS  . irbesartan  300 mg Oral Daily  . isosorbide mononitrate  30 mg Oral Daily  . lamoTRIgine  100 mg Oral BID  . lipase/protease/amylase  24,000 Units Oral Q lunch  . lipase/protease/amylase  48,000 Units Oral 2 times per day  . metolazone  2.5 mg Oral Q Sat  . polyethylene  glycol-electrolytes  4,000 mL Oral Once  . potassium chloride SA  40 mEq Oral Daily  . pregabalin  100 mg Oral BID  . sertraline  50 mg Oral QPM   Continuous:  LAG:[id number] **OR** acetaminophen (TYLENOL) oral liquid 160 mg/5 mL **OR** acetaminophen, LORazepam  EEG: Description: In the awake state there is a posterior predominant 8 Hz alpha rhythm seen from the posterior head regions in a symmetric fashion which attenuates with eye opening.  Background beta rhythm and eye blink artifact noted.  Drowsiness is noted by dropout of background alpha and vertex slowing, however no definite stage II sleep was noted.  Photic stimulation was performed without notable changes.  HV was not performed. EKG was monitored and noted to be sinus rhythym with an average heart rate of 66 bpm. Impression: This is a normal EEG in the awake and drowsy states without any epileptiform abnormalities noted.    Assessment: 71 year old female with left sided weakness. MRI negative for stroke. 1. There is an area of encephalomalacia in the right cortex noted on CT as well as MRI brain. This appears to be a new finding compared to last CT done in 2013. This finding may serve as a seizure focus and DDx for her weakness therefore includes possible seizure with postictal Todd's paresis; this is felt to be the most likely explanation as she has a history of seizures. PTA she was on lamotrigine 150 mg once daily which is unusual dose for seizures. Also was on primidone as listed for seizures, but usually this medicine is used for tremors. EEG is normal.  2. Alternatively, the patient's weakness may represent unmasking/worsening of deficit due to pre-existing lesion (right cerebral cortex focus of encephalomalacia, right precentral gyrus, likely an old ischemic infarction) in the setting of intercurrent illness.  3. Headache, possibly postictal, but also may be a migraine headache. She has a history of migraine headache. Given that  she had blurred vision in conjunction with the weakness, her presentation could also be due to complicated migraine, although this is felt to be unlikely given her age.  4. OSA. Headache can also be due to this condition.  5. Painless hematochezia 6. Chronic pancreatitis, on pancreatic supplements.   Recommendations: 1. Continue lamotrigine 100 mg po BID.  2. Carotid ultrasound and TTE are pending to assess for underlying etiology of the right precentral gyrus lesion, which appears most consistent with an old ischemic infarction.  3. Per [employer] statutes, patients with seizures are not allowed to drive until  they have been seizure-free for six months. Use caution when using heavy equipment or power tools. Avoid working on ladders or at heights. Take showers instead of baths. Ensure the water temperature is not too high on the home water heater. Do not go swimming alone. When caring for infants or small children, sit down when holding, feeding, or changing them to minimize risk of injury to the child in the event you have a seizure. Also, Maintain good sleep hygiene. Avoid alcohol. 4. Continue Lipitor. 5. Restart ASA when able, if source of GI bleed is corrected.     LOS: 0 days   @Electronically  signed: Dr. Kerney Elbe 06/16/2017  10:01 AM

## 2017-06-17 ENCOUNTER — Observation Stay (HOSPITAL_COMMUNITY): Payer: Medicare HMO | Admitting: Anesthesiology

## 2017-06-17 ENCOUNTER — Observation Stay (HOSPITAL_COMMUNITY): Payer: Medicare HMO

## 2017-06-17 ENCOUNTER — Encounter (HOSPITAL_COMMUNITY): Payer: Self-pay

## 2017-06-17 ENCOUNTER — Encounter (HOSPITAL_COMMUNITY)
Admission: EM | Disposition: A | Discharge status: Home Health | Payer: Self-pay | Source: Home / Self Care | Attending: Internal Medicine

## 2017-06-17 DIAGNOSIS — G40909 Epilepsy, unspecified, not intractable, without status epilepticus: Secondary | ICD-10-CM | POA: Diagnosis present

## 2017-06-17 DIAGNOSIS — Z7982 Long term (current) use of aspirin: Secondary | ICD-10-CM | POA: Diagnosis not present

## 2017-06-17 DIAGNOSIS — K529 Noninfective gastroenteritis and colitis, unspecified: Secondary | ICD-10-CM | POA: Diagnosis not present

## 2017-06-17 DIAGNOSIS — Z79899 Other long term (current) drug therapy: Secondary | ICD-10-CM | POA: Diagnosis not present

## 2017-06-17 DIAGNOSIS — E662 Morbid (severe) obesity with alveolar hypoventilation: Secondary | ICD-10-CM | POA: Diagnosis present

## 2017-06-17 DIAGNOSIS — Z90710 Acquired absence of both cervix and uterus: Secondary | ICD-10-CM | POA: Diagnosis not present

## 2017-06-17 DIAGNOSIS — I5031 Acute diastolic (congestive) heart failure: Secondary | ICD-10-CM | POA: Diagnosis not present

## 2017-06-17 DIAGNOSIS — D62 Acute posthemorrhagic anemia: Secondary | ICD-10-CM | POA: Diagnosis present

## 2017-06-17 DIAGNOSIS — Z6841 Body Mass Index (BMI) 40.0 and over, adult: Secondary | ICD-10-CM | POA: Diagnosis not present

## 2017-06-17 DIAGNOSIS — K922 Gastrointestinal hemorrhage, unspecified: Secondary | ICD-10-CM

## 2017-06-17 DIAGNOSIS — Z794 Long term (current) use of insulin: Secondary | ICD-10-CM | POA: Diagnosis not present

## 2017-06-17 DIAGNOSIS — Z885 Allergy status to narcotic agent status: Secondary | ICD-10-CM | POA: Diagnosis not present

## 2017-06-17 DIAGNOSIS — R569 Unspecified convulsions: Secondary | ICD-10-CM

## 2017-06-17 DIAGNOSIS — I5033 Acute on chronic diastolic (congestive) heart failure: Secondary | ICD-10-CM | POA: Diagnosis present

## 2017-06-17 DIAGNOSIS — I110 Hypertensive heart disease with heart failure: Secondary | ICD-10-CM | POA: Diagnosis present

## 2017-06-17 DIAGNOSIS — R0602 Shortness of breath: Secondary | ICD-10-CM | POA: Diagnosis not present

## 2017-06-17 DIAGNOSIS — J9621 Acute and chronic respiratory failure with hypoxia: Secondary | ICD-10-CM

## 2017-06-17 DIAGNOSIS — Z87891 Personal history of nicotine dependence: Secondary | ICD-10-CM | POA: Diagnosis not present

## 2017-06-17 DIAGNOSIS — E1165 Type 2 diabetes mellitus with hyperglycemia: Secondary | ICD-10-CM | POA: Diagnosis present

## 2017-06-17 DIAGNOSIS — Z888 Allergy status to other drugs, medicaments and biological substances status: Secondary | ICD-10-CM | POA: Diagnosis not present

## 2017-06-17 DIAGNOSIS — R2990 Unspecified symptoms and signs involving the nervous system: Secondary | ICD-10-CM | POA: Diagnosis not present

## 2017-06-17 DIAGNOSIS — R0902 Hypoxemia: Secondary | ICD-10-CM

## 2017-06-17 DIAGNOSIS — Z882 Allergy status to sulfonamides status: Secondary | ICD-10-CM | POA: Diagnosis not present

## 2017-06-17 DIAGNOSIS — J9611 Chronic respiratory failure with hypoxia: Secondary | ICD-10-CM

## 2017-06-17 DIAGNOSIS — I5032 Chronic diastolic (congestive) heart failure: Secondary | ICD-10-CM

## 2017-06-17 DIAGNOSIS — Z880 Allergy status to penicillin: Secondary | ICD-10-CM | POA: Diagnosis not present

## 2017-06-17 DIAGNOSIS — K861 Other chronic pancreatitis: Secondary | ICD-10-CM | POA: Diagnosis present

## 2017-06-17 DIAGNOSIS — D125 Benign neoplasm of sigmoid colon: Secondary | ICD-10-CM | POA: Diagnosis present

## 2017-06-17 DIAGNOSIS — K921 Melena: Secondary | ICD-10-CM | POA: Diagnosis present

## 2017-06-17 DIAGNOSIS — G8384 Todd's paralysis (postepileptic): Secondary | ICD-10-CM | POA: Diagnosis present

## 2017-06-17 DIAGNOSIS — Z8673 Personal history of transient ischemic attack (TIA), and cerebral infarction without residual deficits: Secondary | ICD-10-CM | POA: Diagnosis not present

## 2017-06-17 DIAGNOSIS — K5730 Diverticulosis of large intestine without perforation or abscess without bleeding: Secondary | ICD-10-CM | POA: Diagnosis present

## 2017-06-17 DIAGNOSIS — G8194 Hemiplegia, unspecified affecting left nondominant side: Secondary | ICD-10-CM | POA: Diagnosis present

## 2017-06-17 HISTORY — PX: COLONOSCOPY WITH PROPOFOL: SHX5780

## 2017-06-17 LAB — BLOOD GAS, ARTERIAL
Acid-Base Excess: 7.3 mmol/L — ABNORMAL HIGH (ref 0.0–2.0)
Bicarbonate: 32 mmol/L — ABNORMAL HIGH (ref 20.0–28.0)
Drawn by: 520751
FIO2: 21
O2 Saturation: 88.1 %
Patient temperature: 98.6
pCO2 arterial: 51.4 mmHg — ABNORMAL HIGH (ref 32.0–48.0)
pH, Arterial: 7.411 (ref 7.350–7.450)
pO2, Arterial: 54.5 mmHg — ABNORMAL LOW (ref 83.0–108.0)

## 2017-06-17 LAB — GLUCOSE, CAPILLARY
Glucose-Capillary: 92 mg/dL (ref 65–99)
Glucose-Capillary: 234 mg/dL — ABNORMAL HIGH (ref 65–99)
Glucose-Capillary: 134 mg/dL — ABNORMAL HIGH (ref 65–99)

## 2017-06-17 LAB — RETICULOCYTES
RBC.: 5.17 MIL/uL — ABNORMAL HIGH (ref 3.87–5.11)
Retic Count, Absolute: 93.1 10*3/uL (ref 19.0–186.0)
Retic Ct Pct: 1.8 % (ref 0.4–3.1)

## 2017-06-17 LAB — HEMOGLOBIN AND HEMATOCRIT, BLOOD
HCT: 34 % — ABNORMAL LOW (ref 36.0–46.0)
HCT: 36.6 % (ref 36.0–46.0)
Hemoglobin: 10.3 g/dL — ABNORMAL LOW (ref 12.0–15.0)
Hemoglobin: 11.3 g/dL — ABNORMAL LOW (ref 12.0–15.0)

## 2017-06-17 LAB — FERRITIN: Ferritin: 13 ng/mL (ref 11–307)

## 2017-06-17 LAB — FOLATE: Folate: 46 ng/mL (ref >5.9)

## 2017-06-17 LAB — IRON AND TIBC
Iron: 29 ug/dL (ref 28–170)
Saturation Ratios: 7 % — ABNORMAL LOW (ref 10.4–31.8)
TIBC: 434 ug/dL (ref 250–450)
UIBC: 405 ug/dL

## 2017-06-17 LAB — VITAMIN B12: Vitamin B-12: 1376 pg/mL — ABNORMAL HIGH (ref 180–914)

## 2017-06-17 LAB — BRAIN NATRIURETIC PEPTIDE: B Natriuretic Peptide: 133.3 pg/mL — ABNORMAL HIGH (ref 0.0–100.0)

## 2017-06-17 SURGERY — COLONOSCOPY WITH PROPOFOL
Anesthesia: Monitor Anesthesia Care | Laterality: Left

## 2017-06-17 MED ORDER — FUROSEMIDE 10 MG/ML IJ SOLN
40.0000 mg | Freq: Three times a day (TID) | INTRAMUSCULAR | Status: DC
  Administered 2017-06-17 – 2017-06-18 (×3): 40 mg via INTRAVENOUS
  Filled 2017-06-17 (×3): qty 4

## 2017-06-17 MED ORDER — METRONIDAZOLE 500 MG PO TABS: 500.0000 mg | ORAL_TABLET | Freq: Three times a day (TID) | ORAL | 0 refills | Status: DC

## 2017-06-17 MED ORDER — METRONIDAZOLE 500 MG PO TABS
500.0000 mg | ORAL_TABLET | Freq: Three times a day (TID) | ORAL | Status: DC
  Administered 2017-06-17 – 2017-06-18 (×3): 500 mg via ORAL
  Filled 2017-06-17 (×5): qty 1

## 2017-06-17 MED ORDER — PROPOFOL 500 MG/50ML IV EMUL
INTRAVENOUS | Status: DC | PRN
  Administered 2017-06-17: 75 ug/kg/min via INTRAVENOUS

## 2017-06-17 MED ORDER — CIPROFLOXACIN HCL 500 MG PO TABS: 500.0000 mg | ORAL_TABLET | Freq: Two times a day (BID) | ORAL | 0 refills | Status: DC

## 2017-06-17 MED ORDER — METRONIDAZOLE 500 MG PO TABS: 500.0000 mg | ORAL_TABLET | Freq: Three times a day (TID) | ORAL | Status: DC

## 2017-06-17 MED ORDER — EPHEDRINE SULFATE 50 MG/ML IJ SOLN
INTRAMUSCULAR | Status: DC | PRN
  Administered 2017-06-17: 10 mg via INTRAVENOUS

## 2017-06-17 MED ORDER — FUROSEMIDE 10 MG/ML IJ SOLN: 80.0000 mg | Freq: Three times a day (TID) | INTRAMUSCULAR | Status: DC

## 2017-06-17 MED ORDER — LAMOTRIGINE 100 MG PO TABS: 100.0000 mg | ORAL_TABLET | Freq: Two times a day (BID) | ORAL | 0 refills | Status: DC

## 2017-06-17 MED ORDER — CIPROFLOXACIN HCL 500 MG PO TABS
500.0000 mg | ORAL_TABLET | Freq: Two times a day (BID) | ORAL | Status: DC
  Administered 2017-06-17 – 2017-06-18 (×3): 500 mg via ORAL
  Filled 2017-06-17 (×4): qty 1

## 2017-06-17 MED ORDER — PROPOFOL 10 MG/ML IV BOLUS
INTRAVENOUS | Status: DC | PRN
  Administered 2017-06-17: 20 mg via INTRAVENOUS
  Administered 2017-06-17: 30 mg via INTRAVENOUS

## 2017-06-17 NOTE — Op Note (Signed)
Colonoscopy was performed for hematochezia.  A small sigmoid polyp was noted removed via biopsy polypectomy and sent to pathology. Diverticulosis was noted in sigmoid, descending, transverse, hepatic flexure and ascending colon. Mucosa in the cecum and ascending colon appeared abnormal, ulcerated and erythematous, biopsies taken possible ischemic colitis. Retroflexion showed moderate-sized hemorrhoids.  Recommendation: Cipro 500 mg twice a day for 7 days. Flagyl 500 mg 3 times a day for 7 days. Follow-up pathology as an outpatient. Resume high-fiber diet. Okay to DC from GI standpoint. Patient to follow up with Dr. Gwyndolyn Saxon [facility] in 4-6 weeks.  Okay to resume baby aspirin.  [name], M.D.

## 2017-06-17 NOTE — Anesthesia Procedure Notes (Signed)
Procedure Name: MAC Date/Time: 06/17/2017 9:26 AM Performed by: Lance Coon, CRNA Pre-anesthesia Checklist: Patient identified, Emergency Drugs available, Suction available, Timeout performed and Patient being monitored Patient Re-evaluated:Patient Re-evaluated prior to induction Oxygen Delivery Method: Nasal cannula

## 2017-06-17 NOTE — Progress Notes (Signed)
Patient drank 1/2 of her 4L prep this AM between [phone], patient will have only been NPO for 1 hour prior to scheduled procedure. Advised Dr. Tobias Alexander of this, he states to bring patient down for procedure as scheduled.

## 2017-06-17 NOTE — Progress Notes (Signed)
Peripheral IV removed from left AC for infiltration. No redness or edema noted at removal time. This IV was not available in IV assessment to chart removal.

## 2017-06-17 NOTE — Brief Op Note (Signed)
06/15/2017 - 06/17/2017  9:57 AM  PATIENT:  [name]  71 y.o. female  PRE-OPERATIVE DIAGNOSIS:  hematochezia  POST-OPERATIVE DIAGNOSIS:  sigmoid colon polyp removed with biopsy forcep, cecal ulcer vs ischemia, biopsies of ulcer and IC valve, diverticulosis  PROCEDURE:  Procedure(s): COLONOSCOPY WITH PROPOFOL (Left)  SURGEON:  Surgeon(s) and Role:    [name], MD - Primary  PHYSICIAN ASSISTANT:   ASSISTANTS: Jennu Kappus, RN, Hope Parker, Tech  ANESTHESIA:   MAC  EBL:  0 mL   BLOOD ADMINISTERED:none  DRAINS: none   LOCAL MEDICATIONS USED:  NONE  SPECIMEN:  Biopsy / Limited Resection  DISPOSITION OF SPECIMEN:  PATHOLOGY  COUNTS:  YES  TOURNIQUET:  * No tourniquets in log *  DICTATION: .Dragon Dictation  PLAN OF CARE: Admit to inpatient   PATIENT DISPOSITION:  PACU - hemodynamically stable.   Delay start of Pharmacological VTE agent (>24hrs) due to surgical blood loss or risk of bleeding: no

## 2017-06-17 NOTE — Progress Notes (Signed)
PT Cancellation Note  Patient Details Name: [name] MRN: [id number] DOB: September 11, 1945   Cancelled Treatment:    Reason Eval/Treat Not Completed: Patient at procedure or test/unavailable. Pt off floor since 7:30am. PT to return as able to complete PT treatment.   [name] [name] 06/17/2017, 9:44 AM   [name], PT, DPT Pager #: [phone] Office #: [phone]

## 2017-06-17 NOTE — Progress Notes (Signed)
Patient finished the Golytely 4000 ml  at about 0630. Stool is clear and yellow. Patient tolerated well.

## 2017-06-17 NOTE — Care Management Note (Signed)
Case Management Note  Patient Details  Name: [name] MRN: [id number] Date of Birth: [birth year]  Subjective/Objective:                    Action/Plan: The plan is for the patient to d/c home with [facility] services this pm. Awaiting a few more tests. CM met with the patient and then called her sister, [name], per her request to provide choice of [facility] agencies. [name] selected [place]. Jermaine with [facility] notified and accepted the referral.  Pt states she has transportation home when she is d/ced.   Expected Discharge Date:  06/17/17               Expected Discharge Plan:  [place]  In-House Referral:     Discharge planning Services  CM Consult  Post Acute Care Choice:  Home Health Choice offered to:  Patient, Sibling  DME Arranged:    DME Agency:     HH Arranged:    [place] Agency:  [place]  Status of Service:  Completed, signed off  If discussed at [place] of Stay Meetings, dates discussed:    Additional Comments:  Pollie Friar, RN 06/17/2017, 12:37 PM

## 2017-06-17 NOTE — Anesthesia Preprocedure Evaluation (Addendum)
Anesthesia Evaluation  Patient identified by MRN, date of birth, ID band Patient awake    Reviewed: Allergy & Precautions, NPO status , Patient's Chart, lab work & pertinent test results, reviewed documented beta blocker date and time   Airway Mallampati: II  TM Distance: <3 FB Neck ROM: Full    Dental  (+) Poor Dentition, Missing, Caps   Pulmonary sleep apnea , former smoker,    Pulmonary exam normal        Cardiovascular hypertension, Pt. on medications and Pt. on home beta blockers + Past MI and +CHF   Rhythm:Regular Rate:Normal + Systolic murmurs Study Conclusions  - Left ventricle: The cavity size was normal. There was mild   concentric hypertrophy. Systolic function was normal. The   estimated ejection fraction was in the range of 55% to 60%. Wall   motion was normal; there were no regional wall motion   abnormalities. Features are consistent with a pseudonormal left   ventricular filling pattern, with concomitant abnormal relaxation   and increased filling pressure (grade 2 diastolic dysfunction). - Ventricular septum: The contour showed diastolic flattening. - Aortic valve: Trileaflet; mildly thickened, mildly calcified   Leaflets. NM [employer]  01/2016 LOW RISK. No ischemia or infarction.     Neuro/Psych Seizures -, Well Controlled,  PSYCHIATRIC DISORDERS Depression parkinsons dz    GI/Hepatic Neg liver ROS,   Endo/Other  diabetesMorbid obesity  Renal/GU negative Renal ROS  negative genitourinary   Musculoskeletal negative musculoskeletal ROS (+)   Abdominal   Peds negative pediatric ROS (+)  Hematology  (+) anemia ,   Anesthesia Other Findings   Reproductive/Obstetrics negative OB ROS                           Anesthesia Physical  Anesthesia Plan  ASA: III  Anesthesia Plan: MAC   Post-op Pain Management:    Induction: Intravenous  PONV Risk Score and Plan: 2 and  Ondansetron and Propofol infusion  Airway Management Planned: Natural Airway  Additional Equipment:   Intra-op Plan:   Post-operative Plan:   Informed Consent: I have reviewed the patients History and Physical, chart, labs and discussed the procedure including the risks, benefits and alternatives for the proposed anesthesia with the patient or authorized representative who has indicated his/her understanding and acceptance.   Dental Advisory Given and Dental advisory given  Plan Discussed with: CRNA and Anesthesiologist  Anesthesia Plan Comments:        Anesthesia Quick Evaluation

## 2017-06-17 NOTE — Discharge Instructions (Signed)
Resume Aspirin and Naproxen in 5 days. If you bleed again on these medications please contact Dr Therisa Doyne or your PCP ASAP.   Please take all your medications with you for your next visit with your Primary MD. Please request your Primary MD to go over all hospital test results at the follow up. Please ask your Primary MD to get all Hospital records sent to his/her office.  If you experience worsening of your admission symptoms, develop shortness of breath, chest pain, suicidal or homicidal thoughts or a life threatening emergency, you must seek medical attention immediately by calling 911 or calling your MD.  [name] must read the complete instructions/literature along with all the possible adverse reactions/side effects for all the medicines you take including new medications that have been prescribed to you. Take new medicines after you have completely understood and accpet all the possible adverse reactions/side effects.   Do not drive when taking pain medications or sedatives.    Do not take more than prescribed Pain, Sleep and Anxiety Medications  If you have smoked or chewed Tobacco in the last 2 yrs please stop. Stop any regular alcohol and or recreational drug use.  Wear Seat belts while driving.

## 2017-06-17 NOTE — Op Note (Signed)
[facility] Patient Name: [name] Procedure Date : 06/17/2017 MRN: [id number] Attending MD: [name] , MD Date of Birth: 1945-08-18 CSN: [id number] Age: 71 Admit Type: Inpatient Procedure:                Colonoscopy Indications:              Last colonoscopy: 2014, Rectal bleeding Providers:                [name], MD, Carmie End, RN, Lance Coon, CRNA, Cletis [place], Technician Referring MD:              Medicines:                Monitored Anesthesia Care Complications:            No immediate complications. Estimated Blood Loss:     Estimated blood loss: none. Procedure:                Pre-Anesthesia Assessment:                           - Prior to the procedure, a History and Physical                            was performed, and patient medications and                            allergies were reviewed. The patient's tolerance of                            previous anesthesia was also reviewed. The risks                            and benefits of the procedure and the sedation                            options and risks were discussed with the patient.                            All questions were answered, and informed consent                            was obtained. Prior Anticoagulants: The patient has                            taken aspirin, last dose was 2 days prior to                            procedure. ASA Grade Assessment: III - A patient                            with severe systemic disease. After reviewing the  risks and benefits, the patient was deemed in                            satisfactory condition to undergo the procedure.                           After obtaining informed consent, the colonoscope                            was passed under direct vision. Throughout the                            procedure, the patient's blood pressure, pulse, and                             oxygen saturations were monitored continuously. The                            EC-[id number] ([id number]) scope was introduced through                            the anus and advanced to the the cecum, identified                            by appendiceal orifice and ileocecal valve. The                            colonoscopy was performed without difficulty. The                            patient tolerated the procedure well. The quality                            of the bowel preparation was adequate to identify                            polyps 6 mm and larger in size. The ileocecal                            valve, appendiceal orifice, and rectum were                            photographed. Scope In: 9:32:11 AM Scope Out: 9:53:06 AM Scope Withdrawal Time: 0 hours 12 minutes 32 seconds  Total Procedure Duration: 0 hours 20 minutes 55 seconds  Findings:      The perianal and digital rectal examinations were normal.      Scattered small and large-mouthed diverticula were found in the sigmoid       colon, descending colon, transverse colon, hepatic flexure and ascending       colon.      A patchy area of moderately erythematous and ulcerated mucosa was found       in the ascending colon and in the cecum. Biopsies were taken with a cold       forceps for  histology.      A 5 mm polyp was found in the sigmoid colon. The polyp was sessile. The       polyp was removed with a cold biopsy forceps. Resection and retrieval       were complete.      Non-bleeding internal hemorrhoids were found during retroflexion. The       hemorrhoids were moderate and medium-sized. Impression:               - Diverticulosis in the sigmoid colon, in the                            descending colon, in the transverse colon, at the                            hepatic flexure and in the ascending colon.                           - Erythematous and ulcerated mucosa in the                            ascending colon and in  the cecum. Biopsied.                            ?ISCHEMIC COLITIS                           - One 5 mm polyp in the sigmoid colon, removed with                            a cold biopsy forceps. Resected and retrieved.                           - Non-bleeding internal hemorrhoids. Moderate Sedation:      Patient did not receive moderate sedation for this procedure, but       instead received monitored anesthesia care. Recommendation:           - High fiber diet.                           - Continue present medications.                           - Await pathology results.                           - Repeat colonoscopy in 3 - 5 years for                            surveillance based on pathology results.                           - Cipro (ciprofloxacin) 500 mg PO BID for 7 days.                           - Flagyl (metronidazole) 500 mg PO TID for  7 days.                           - Return patient to hospital ward for ongoing care. Procedure Code(s):        --- Professional ---                           [phone], Colonoscopy, flexible; with biopsy, single                            or multiple Diagnosis Code(s):        --- Professional ---                           K64.8, Other hemorrhoids                           K63.89, Other specified diseases of intestine                           K63.3, Ulcer of intestine                           D12.5, Benign neoplasm of sigmoid colon                           K62.5, Hemorrhage of anus and rectum                           K57.30, Diverticulosis of large intestine without                            perforation or abscess without bleeding CPT copyright 2016 [employer]. All rights reserved. The codes documented in this report are preliminary and upon coder review may  be revised to meet current compliance requirements. [name], MD 06/17/2017 9:57:29 AM This report has been signed electronically. Number of Addenda: 0

## 2017-06-17 NOTE — Anesthesia Postprocedure Evaluation (Signed)
Anesthesia Post Note  Patient: [name]  Procedure(s) Performed: COLONOSCOPY WITH PROPOFOL (Left )     Patient location during evaluation: PACU Anesthesia Type: MAC Level of consciousness: awake and alert Pain management: pain level controlled Vital Signs Assessment: post-procedure vital signs reviewed and stable Respiratory status: spontaneous breathing and respiratory function stable Cardiovascular status: stable Postop Assessment: no apparent nausea or vomiting Anesthetic complications: no    Last Vitals:  Vitals:   06/17/17 1000 06/17/17 1005  BP: (!) 121/35 (!) 137/47  Pulse: 66 72  Resp: 16 15  Temp: 36.7 C   SpO2: 95% 93%    Last Pain:  Vitals:   06/17/17 1000  TempSrc: Oral  PainSc:                  Mehmet Scally DANIEL

## 2017-06-17 NOTE — Progress Notes (Signed)
Physical Therapy Treatment Patient Details Name: [name] MRN: [id number] DOB: [birth year] Today's Date: 06/17/2017    History of Present Illness Pt is a 71 y/o female admitted secondary to blurred vision and left sided weakness. MRI was negative for any acute findings. PMH including but not limited to HTN, HLD, CAD, chronic diastolic CHF, CVA, DM type II and seizure disorder.    PT Comments    Pt progressing well with activity tolerance however SPO2 dec into 70s on RA. Pt able to complete stair negotiation and amb 175' with 3 standing rest breaks.  SATURATION QUALIFICATIONS: (This note is used to comply with regulatory documentation for home oxygen)  Patient Saturations on Room Air at Rest = 90%  Patient Saturations on Room Air while Ambulating = 72%  Patient Saturations on 2 Liters of oxygen while Ambulating = 82-88%  Please briefly explain why patient needs home oxygen: unable to maintain >88% on RA    Follow Up Recommendations  Home health PT;Supervision - Intermittent     Equipment Recommendations  None recommended by PT;Other (comment)    Recommendations for Other Services       Precautions / Restrictions Precautions Precautions: Fall Precaution Comments: watch SpO2 Restrictions Weight Bearing Restrictions: No    Mobility  Bed Mobility               General bed mobility comments: pt up in chair upon PT arrival  Transfers Overall transfer level: Needs assistance Equipment used: Straight cane Transfers: Sit to/from Stand Sit to Stand: Supervision         General transfer comment: increased time, mild effort  Ambulation/Gait Ambulation/Gait assistance: Min guard Ambulation Distance (Feet): 150 Feet Assistive device: Straight cane Gait Pattern/deviations: Step-through pattern;Decreased stride length;Wide base of support Gait velocity: dec Gait velocity interpretation: Below normal speed for age/gender General Gait Details: moderate  instability, SpO2 dec to 72% on RA, 82% on 2 LO2 via [place]. Pt with SOB, pt reports feeling better with O2 on  than without, no episodes of LOB   Stairs            Wheelchair Mobility    Modified Rankin (Stroke Patients Only)       Balance Overall balance assessment: Needs assistance Sitting-balance support: Feet supported Sitting balance-Leahy Scale: Fair     Standing balance support: During functional activity;Single extremity supported Standing balance-Leahy Scale: Poor Standing balance comment: reliant on at least one UE support                            Cognition Arousal/Alertness: Awake/alert Behavior During Therapy: WFL for tasks assessed/performed Overall Cognitive Status: Within Functional Limits for tasks assessed                                        Exercises      General Comments General comments (skin integrity, edema, etc.): discussed with MD and RN pt's SpO2 during amb      Pertinent Vitals/Pain Pain Assessment: No/denies pain    Home Living                      Prior Function            PT Goals (current goals can now be found in the care plan section) Acute Rehab PT Goals Patient Stated Goal: home today Progress  towards PT goals: Progressing toward goals    Frequency    Min 3X/week      PT Plan Discharge plan needs to be updated    Co-evaluation              AM-PAC PT "6 Clicks" Daily Activity  Outcome Measure  Difficulty turning over in bed (including adjusting bedclothes, sheets and blankets)?: A Lot Difficulty moving from lying on back to sitting on the side of the bed? : A Lot Difficulty sitting down on and standing up from a chair with arms (e.g., wheelchair, bedside commode, etc,.)?: A Lot Help needed moving to and from a bed to chair (including a wheelchair)?: A Little Help needed walking in hospital room?: A Little Help needed climbing 3-5 steps with a railing? : A Little 6  Click Score: 15    End of Session Equipment Utilized During Treatment: Gait belt Activity Tolerance: Patient limited by fatigue Patient left: with call bell/phone within reach;in chair Nurse Communication: Mobility status PT Visit Diagnosis: Other abnormalities of gait and mobility (R26.89);Muscle weakness (generalized) (M62.81)     Time: 8756-4332 PT Time Calculation (min) (ACUTE ONLY): 20 min  Charges:  $Gait Training: 8-22 mins                    G Codes:       [name], PT, DPT Pager #: [phone] Office #: [phone]    [place] 06/17/2017, 12:09 PM

## 2017-06-17 NOTE — Progress Notes (Signed)
Pt ambulated on room air with RN and RT; O2 saturation dropped to 84;  Pt stated "I am not short of breath."  Also informed me that she had a sleep study in the past, was ordered a CPAP but cannot afford it.  She quickly recovered once in chair.  MD notified.

## 2017-06-17 NOTE — Progress Notes (Signed)
PROGRESS NOTE    [name]   WLN:[id number]  DOB: Nov 06, 1945  DOA: 06/15/2017 PCP: [name], MD   Brief Narrative:  [name] is a 71 y.o. female with medical history significant of HTN, HLD, CAD, chronic diastolic CHF, CVA, DM type II, seizure disorder, obesity hypoventilation syndrome who present for multiple neurological complaints after waking up at 2:30 AM- difficultly moving right leg and arm, dizziness, headache.  She also has had bloody stools for a few days now- bright red blood- no abdominal pain.  Lasix recently increased by her cardiology a couple of days ago due to pedal edema, orthopnea and weight gain.   Subjective: Bloody BMs resolved. Last one was prior to her colonoscopy prep.  ROS: no complaints of nausea, vomiting, constipation diarrhea, cough, dyspnea or dysuria. No other complaints.   Assessment & Plan:   Principal Problem:   Stroke-like symptoms- seizure disorder - MRI is negative except fo r encephalomalacia from an old infarct - EEG is normal - ? Seizure during sleepy- Lamotrigine changed from 150 mg daily to to BID 100 mg  GI bleed- ? Ischemic colitis - bright red blood per rectum daily for past few days - hemoccult + - GI consulted- colonoscopy suggestive of diverticulosis ischemic colitis with ulcers- Cipro and Flagyl x 1 wk recommended by GI- follow closely for further seizures on Cipro- she is allergic to PCN and per GI, there are not other options for treatment - note: polypectomy also done and biopsy will be followed by GI, moderate hemorrhoids also found  Acute blood loss anemia - Hb 13 >> 11- f/u anemia panel - f/u for need to transfuse   Acute on Chronic diastolic congestive heart failure  - is hypoxic on exertion today with pulse ox in mid 80s - CXR suggestive of pulmonary edema despite being on oral Lasix BID - will change Lasix to IV today and diurese agressively- follow I and O and daily weights -  patient states she was on O2 in a the past, about 6 yrs ago but decided to stop wearing it- her hypoxia may simply be her baseline- will follow    Type II diabetes mellitus, uncontrolled  - Lantus and Novolog  HTN - Avapro, Imdur, Coreg  Chronic pancreatitis - cont Creon   DVT prophylaxis: SCDs Code Status: Full code Family Communication:  Disposition Plan: home when stable Consultants:   GI Procedures:   2 D ECHO Left ventricle: The cavity size was normal. There was mild   concentric hypertrophy. Systolic function was normal. The   estimated ejection fraction was in the range of 55% to 60%. Wall   motion was normal; there were no regional wall motion   abnormalities. Features are consistent with a pseudonormal left   ventricular filling pattern, with concomitant abnormal relaxation   and increased filling pressure (grade 2 diastolic dysfunction). - Ventricular septum: The contour showed diastolic flattening. - Aortic valve: Trileaflet; mildly thickened, mildly calcified   leaflets. - Left atrium: The atrium was moderately dilated.   Anterior-posterior dimension: 47 mm. - Right ventricle: The cavity size was mildly dilated. Wall   thickness was normal. - Right atrium: The atrium was moderately dilated. - Tricuspid valve: There was mild regurgitation. Antimicrobials:  Anti-infectives (From admission, onward)   Start     Dose/Rate Route Frequency Ordered Stop   06/17/17 1400  metroNIDAZOLE (FLAGYL) tablet 500 mg  Status:  Discontinued     500 mg Oral Every 8  hours 06/17/17 1037 06/17/17 1053   06/17/17 1045  ciprofloxacin (CIPRO) tablet 500 mg     500 mg Oral 2 times daily 06/17/17 1037 06/24/17 0759   06/17/17 0000  ciprofloxacin (CIPRO) 500 MG tablet     500 mg Oral 2 times daily 06/17/17 1130     06/17/17 0000  metroNIDAZOLE (FLAGYL) 500 MG tablet     500 mg Oral 3 times daily 06/17/17 1130         Objective: Vitals:   06/17/17 1102 06/17/17 1345 06/17/17 1350  06/17/17 1406  BP: (!) 153/72     Pulse: 61     Resp: 17     Temp: 97.9 F (36.6 C)     TempSrc: Axillary     SpO2: 100% (!) 84% 97% 92%  Weight:      Height:        Intake/Output Summary (Last 24 hours) at 06/17/2017 1429 Last data filed at 06/17/2017 0955 Gross per 24 hour  Intake 600 ml  Output 1100 ml  Net -500 ml   Filed Weights   06/16/17 0500 06/17/17 0558 06/17/17 0731  Weight: 131 kg (288 lb 12.8 oz) 127.1 kg (280 lb 3.3 oz) 127 kg (280 lb)    Examination: General exam: Appears comfortable  HEENT: PERRLA, oral mucosa moist, no sclera icterus or thrush Respiratory system: Clear to auscultation. Respiratory effort normal. Cardiovascular system: S1 & S2 heard, RRR.  No murmurs  Gastrointestinal system: Abdomen soft, non-tender, nondistended. Normal bowel sound. No organomegaly Central nervous system: Alert and oriented. No focal neurological deficits. Extremities: No cyanosis, clubbing or edema Skin: No rashes or ulcers Psychiatry:  Mood & affect appropriate.     Data Reviewed: I have personally reviewed following labs and imaging studies  CBC: Recent Labs  Lab 06/15/17 0355  06/15/17 1315 06/16/17 0019 06/16/17 1201 06/16/17 2357 06/17/17 1227  WBC 5.5  --   --  5.3  --   --   --   NEUTROABS 2.7  --   --   --   --   --   --   HGB 12.3   < > 10.2* 9.8*  9.7* 10.9* 10.3* 11.3*  HCT 37.9   < > 33.3* 32.4*  32.1* 36.7 34.0* 36.6  MCV 75.2*  --   --  75.9*  --   --   --   PLT 259  --   --  224  --   --   --    < > = values in this interval not displayed.   Basic Metabolic Panel: Recent Labs  Lab 06/15/17 0355 06/15/17 0411 06/16/17 0019  NA 137 140 137  K 4.1 4.0 3.7  CL 104 103 104  CO2 23  --  29  GLUCOSE 107* 110* 101*  BUN 16 19 12   CREATININE 1.04* 1.00 1.00  CALCIUM 8.9  --  8.2*   GFR: Estimated Creatinine Clearance: 66.6 mL/min (by C-G formula based on SCr of 1 mg/dL). Liver Function Tests: Recent Labs  Lab 06/15/17 0355  AST 35   ALT 21  ALKPHOS 122  BILITOT 0.7  PROT 8.1  ALBUMIN 4.1   No results for input(s): LIPASE, AMYLASE in the last 168 hours. No results for input(s): AMMONIA in the last 168 hours. Coagulation Profile: Recent Labs  Lab 06/15/17 0355  INR 1.16   Cardiac Enzymes: No results for input(s): CKTOTAL, CKMB, CKMBINDEX, TROPONINI in the last 168 hours. BNP (last 3 results) No results  for input(s): PROBNP in the last 8760 hours. HbA1C: No results for input(s): HGBA1C in the last 72 hours. CBG: Recent Labs  Lab 06/16/17 0650 06/16/17 1134 06/16/17 1642 06/16/17 2235 06/17/17 0553  GLUCAP 75 126* 71 126* 92   Lipid Profile: No results for input(s): CHOL, HDL, LDLCALC, TRIG, CHOLHDL, LDLDIRECT in the last 72 hours. Thyroid Function Tests: No results for input(s): TSH, T4TOTAL, FREET4, T3FREE, THYROIDAB in the last 72 hours. Anemia Panel: No results for input(s): VITAMINB12, FOLATE, FERRITIN, TIBC, IRON, RETICCTPCT in the last 72 hours. Urine analysis:    Component Value Date/Time   COLORURINE YELLOW 03/04/2017 1520   APPEARANCEUR HAZY (A) 03/04/2017 1520   LABSPEC 1.001 (L) 03/04/2017 1520   PHURINE 5.0 03/04/2017 1520   GLUCOSEU >=500 (A) 03/04/2017 1520   GLUCOSEU NEGATIVE 04/17/2016 0955   HGBUR NEGATIVE 03/04/2017 1520   BILIRUBINUR NEGATIVE 03/04/2017 1520   KETONESUR NEGATIVE 03/04/2017 1520   PROTEINUR NEGATIVE 03/04/2017 1520   UROBILINOGEN 0.2 04/17/2016 0955   NITRITE NEGATIVE 03/04/2017 1520   LEUKOCYTESUR NEGATIVE 03/04/2017 1520   Sepsis Labs: @LABRCNTIP (procalcitonin:4,lacticidven:4) )No results found for this or any previous visit (from the past 240 hour(s)).       Radiology Studies: Dg Chest 2 View  Result Date: 06/17/2017 CLINICAL DATA:  Shortness of breath. EXAM: CHEST  2 VIEW COMPARISON:  June 15, 2017 FINDINGS: Cardiomegaly. Tiny effusions. Mild atelectasis in the bases. The hila and mediastinum are normal. No nodules or masses. No focal  infiltrates. Mild pulmonary venous congestion. IMPRESSION: Cardiomegaly, mild pulmonary venous congestion, and tiny effusions. Electronically Signed   By: Dorise Bullion III M.D   On: 06/17/2017 13:18      Scheduled Meds: .  stroke: mapping our early stages of recovery book   Does not apply Once  . atorvastatin  10 mg Oral QPM  . carbidopa-levodopa  1 tablet Oral TID  . carvedilol  25 mg Oral BID  . ciprofloxacin  500 mg Oral BID  . furosemide  40 mg Intravenous TID  . insulin aspart  0-15 Units Subcutaneous TID WC  . insulin glargine  36 Units Subcutaneous QHS  . irbesartan  300 mg Oral Daily  . isosorbide mononitrate  30 mg Oral Daily  . lamoTRIgine  100 mg Oral BID  . lipase/protease/amylase  24,000 Units Oral Q lunch  . lipase/protease/amylase  48,000 Units Oral 2 times per day  . metolazone  2.5 mg Oral Q Sat  . potassium chloride SA  40 mEq Oral Daily  . pregabalin  100 mg Oral BID  . sertraline  50 mg Oral QPM   Continuous Infusions:   LOS: 0 days    Time spent in minutes: 35    Debbe Odea, MD [facility] Pager: [email or web address] Password [facility] 06/17/2017, 2:29 PM

## 2017-06-17 NOTE — Care Management Obs Status (Signed)
[place] NOTIFICATION   Patient Details  Name: [name] MRN: [id number] Date of Birth: 1946-05-02   Medicare Observation Status Notification Given:  Yes    Pollie Friar, RN 06/17/2017, 12:19 PM

## 2017-06-17 NOTE — Discharge Summary (Addendum)
Physician Discharge Summary  [name] AST:[id number] DOB: [birth year] DOA: 06/15/2017  PCP: [name], MD  Admit date: 06/15/2017 Discharge date: 06/18/2017  Admitted From: home Disposition:  home   Recommendations for Outpatient Follow-up:  1. F/u fluid overload and ambulatory pulse ox 2. F/u BP as outpt 3. Needs Bmet and CBC later this weeek 4. Due to history of prior infarct (see MRI below) she will eventually need to be back on a baby aspirin once colonic ulcers have healed.   Discharge Condition:  stable   CODE STATUS:  Full code   Consultations:  GI  Neuro    Discharge Diagnoses:  Principal Problem:   Stroke-like symptoms Active Problems:   Seizure disorder (HCC)    Acute lower GI bleeding   Acute diastolic CHF (congestive heart failure) (HCC)   Acute on chronic respiratory failure with hypoxia (HCC)   Type II diabetes mellitus, uncontrolled (HCC)     Subjective: No dyspnea at rest or orthopnea. No GI bleed, nausea or vomiting.   Brief Summary: [name] Snowball Shepardis a 71 y.o.femalewith medical history significant ofHTN, HLD, CAD, chronic diastolic CHF, CVA, DM type II, seizure disorder, obesity hypoventilation syndrome who present for multiple neurological complaints after waking up at 2:30 AM- difficultly moving right leg and arm, dizziness, headache.  She also has had bloody stools for a few days now- bright red blood- no abdominal pain.  Lasix recently increased by her cardiology a couple of days ago due to pedal edema, orthopnea and weight gain.     Hospital Course:  Principal Problem:   Stroke-like symptoms- seizure disorder - MRI is negative except for encephalomalacia from an old infarct - EEG is normal - Neuro feels she may have had a seizure during her sleep with subsequent Todd's paralysis- Lamotrigine changed from 150 mg daily to to BID 100 mg by neuro- see below in regards to Cipro  GI bleed- ? Ischemic colitis,  moderate hemorrhoids - bright red blood per rectum daily for past few days starting prior to admission - hemoccult + - GI consulted- colonoscopy suggestive of diverticulosis & ischemic colitis with ulcers - Cipro and Flagyl x 1 wk recommended by GI- follow closely for further seizures on Cipro-I have warned her of this-  she is allergic to PCN and per GI, there are not other options for treatment - note: polypectomy done and biopsy will be followed by GI, moderate hemorrhoids also found  Acute blood loss anemia- microcytosis - Hb 13 >> 10 with bleeding -  anemia panel consistent with low Iron levels-start oral replacement and f/u Hb in 1 month   Acute on Chronic diastolic congestive heart failure  -12/24 - noted to behypoxic on exertion with pulse ox in mid 80s - CXR suggestive of pulmonary edema despite being on oral Lasix BID -  changed Lasix to IV  Unfortunately I and O were not documented as ordered however, her pulse ox today has improved to 90 on exertion. - no crackles on lung exam, no pedal edema, no other symptoms- can change Lasix back to oral  - patient states she was on O2 in a the past, about 6 yrs ago but decided to stop wearing it- her hypoxia may simply be her baseline - her Cardiologist had recently increased her dose of Lasix and I have told her to continue this higher dose (she takes 80 mg alternating with 120 QOD) I have not changed this -  follow as outpt  Type II diabetes mellitus, uncontrolled  - Lantus and Novolog  HTN - Avapro, Imdur, Coreg  Chronic pancreatitis - cont Creon  Discharge Instructions  Discharge Instructions    Diet - low sodium heart healthy   Complete by:  As directed    Diet Carb Modified   Complete by:  As directed    Increase activity slowly   Complete by:  As directed      Allergies as of 06/18/2017      Reactions   Penicillins Hives   Has patient had a PCN reaction causing immediate rash, facial/tongue/throat swelling,  SOB or lightheadedness with hypotension: Yes Has patient had a PCN reaction causing severe rash involving mucus membranes or skin necrosis: No Has patient had a PCN reaction that required hospitalization: No Has patient had a PCN reaction occurring within the last 10 years: No If all of the above answers are "NO", then may proceed with Cephalosporin use.   Sulfa Antibiotics Hives   Codeine    Headache and makes the patient feel "off"   Other Rash   Bleach      Medication List    STOP taking these medications   aspirin EC 81 MG tablet   naproxen 500 MG tablet Commonly known as:  NAPROSYN     TAKE these medications   ACCU-CHEK FASTCLIX LANCETS Misc USE  TO CHECK BLOOD SUGAR THREE TIMES DAILY   ACCU-CHEK NANO SMARTVIEW w/Device Kit Use to check blood sugar 3 times per day dx code E11.65   ACCU-CHEK SMARTVIEW test strip Generic drug:  glucose blood CHECK BLOOD SUGAR THREE TIMES DAILY   acetaminophen 500 MG tablet Commonly known as:  TYLENOL Take 500-1,000 mg by mouth every 4 (four) hours as needed (for pain).   atorvastatin 10 MG tablet Commonly known as:  LIPITOR Take 10 mg by mouth daily.   B-complex with vitamin C tablet Take 1 tablet by mouth daily.   B-D SINGLE USE SWABS REGULAR Pads Use as directed   carbidopa-levodopa 25-100 MG tablet Commonly known as:  SINEMET IR Take 1 tablet by mouth 3 (three) times daily.   carvedilol 25 MG tablet Commonly known as:  COREG Take 1 tablet (25 mg total) by mouth 2 (two) times daily.   cholestyramine 4 g packet Commonly known as:  QUESTRAN Take 4 g by mouth 2 (two) times daily as needed (for diarrhea).   ciprofloxacin 500 MG tablet Commonly known as:  CIPRO Take 1 tablet (500 mg total) by mouth 2 (two) times daily.   furosemide 40 MG tablet Commonly known as:  LASIX Take 1 tablet (40 mg total) by mouth daily.   insulin aspart 100 UNIT/ML FlexPen Commonly known as:  NOVOLOG FLEXPEN Inject4- 6 units before  breakfast and lunch and 10-12 units before dinner What changed:    how much to take  how to take this  when to take this  additional instructions   Insulin Glargine 300 UNIT/ML Sopn Commonly known as:  TOUJEO SOLOSTAR Inject 40 Units into the skin daily. What changed:    how much to take  when to take this   Insulin Pen Needle 31G X 8 MM Misc Use to inject medication 4 times daily   isosorbide mononitrate 30 MG 24 hr tablet Commonly known as:  IMDUR TAKE 1 TABLET (30 MG TOTAL) BY MOUTH DAILY.   JARDIANCE 10 MG Tabs tablet Generic drug:  empagliflozin TAKE ONE TABLET BY MOUTH ONCE DAILY WITH  BREAKFAST What changed:  See  the new instructions.   lamoTRIgine 100 MG tablet Commonly known as:  LAMICTAL Take 1 tablet (100 mg total) by mouth 2 (two) times daily. What changed:  when to take this   Menthol (Topical Analgesic) 3.5 % Gel Apply 1 application topically as needed (for neuropathy in feet).   metolazone 2.5 MG tablet Commonly known as:  ZAROXOLYN Take 1 tablet (2.5 mg total) by mouth once a week. What changed:  when to take this   metroNIDAZOLE 500 MG tablet Commonly known as:  FLAGYL Take 1 tablet (500 mg total) by mouth 3 (three) times daily.   multivitamin with minerals tablet Take 1 tablet by mouth daily.   olmesartan 40 MG tablet Commonly known as:  BENICAR Take 40 mg by mouth daily.   Pancrelipase (Lip-Prot-Amyl) 25000 units Cpep Take 25,000-50,000 Units by mouth See admin instructions. 50,000 units with breakfast then 25,000 units with lunch then 50,000 units with dinner (evening meal)   potassium chloride SA 20 MEQ tablet Commonly known as:  K-DUR,KLOR-CON Take 2 tablets (40 mEq total) by mouth daily. On the day you take metolazone, take an extra 20 meq (60 meq total) What changed:  additional instructions   pregabalin 100 MG capsule Commonly known as:  LYRICA Take 100 mg by mouth 2 (two) times daily.   sertraline 50 MG tablet Commonly  known as:  ZOLOFT Take 50 mg by mouth every evening.   VICTOZA 18 MG/3ML Sopn Generic drug:  liraglutide INJECT 1.8MG SUBCUTANEOUSLY DAILY (NEEDS APPOINTMENT FOR REFILLS) What changed:  See the new instructions.   Vitamin D-3 1000 units Caps Take 1,000 Units by mouth daily.      Follow-up Information    [name], MD Follow up.   Specialty:  Gastroenterology Contact information: [place]  [address] [address] [facility].   Why:  they will contact you for the first appointment Contact information: [place] [address] [phone]          Allergies  Allergen Reactions  . Penicillins Hives    Has patient had a PCN reaction causing immediate rash, facial/tongue/throat swelling, SOB or lightheadedness with hypotension: Yes Has patient had a PCN reaction causing severe rash involving mucus membranes or skin necrosis: No Has patient had a PCN reaction that required hospitalization: No Has patient had a PCN reaction occurring within the last 10 years: No If all of the above answers are "NO", then may proceed with Cephalosporin use.   . Sulfa Antibiotics Hives  . Codeine     Headache and makes the patient feel "off"  . Other Rash    Bleach     Procedures/Studies:  colonoscopy EEG  Dg Chest 2 View  Result Date: 06/17/2017 CLINICAL DATA:  Shortness of breath. EXAM: CHEST  2 VIEW COMPARISON:  June 15, 2017 FINDINGS: Cardiomegaly. Tiny effusions. Mild atelectasis in the bases. The hila and mediastinum are normal. No nodules or masses. No focal infiltrates. Mild pulmonary venous congestion. IMPRESSION: Cardiomegaly, mild pulmonary venous congestion, and tiny effusions. Electronically Signed   By: Dorise Bullion III M.D   On: 06/17/2017 13:18   Dg Chest 2 View  Result Date: 06/15/2017 CLINICAL DATA:  Pt arrived via EMS from home with c/o HA, LSN 2200, pt awoke @ 0200 with  HA, dizziness and blurred vision. L sided weakness. Hx of CHF, MI, diastolic heart failure, diabetes, HTN, PNA EXAM:  CHEST  2 VIEW COMPARISON:  03/04/2017 FINDINGS: Cardiac silhouette is mildly enlarged. No mediastinal hilar masses. There is prominence of the hila bilaterally which appears vascular. Central vascular congestion. Lungs otherwise clear with no overt pulmonary edema. No pleural effusion or pneumothorax. Skeletal structures are grossly intact. IMPRESSION: 1. No acute cardiopulmonary disease. 2. Mild cardiomegaly and central vascular congestion similar to the prior study. Electronically Signed   By: Lajean Manes M.D.   On: 06/15/2017 07:22   Mr Brain Wo Contrast  Result Date: 06/15/2017 CLINICAL DATA:  Dizziness and blurred vision.  Left-sided weakness. EXAM: MRI HEAD WITHOUT CONTRAST TECHNIQUE: Multiplanar, multiecho pulse sequences of the brain and surrounding structures were obtained without intravenous contrast. COMPARISON:  1. Brain MRI 08/05/2011 2. Head CT 04/29/2012 3. Head CT 06/15/2017 FINDINGS: Brain: There is no acute infarct or acute hemorrhage. No mass lesion, hydrocephalus, dural abnormality or extra-axial collection. Old infarct of the right frontal lobe precentral gyrus corresponds to the hypodensity seen on the CT. Otherwise mild multifocal white matter hyperintensity consistent with chronic small vessel disease. The right parietal infarct is new compared to 08/05/2011. No chronic microhemorrhage or superficial siderosis. Vascular: Major intracranial arterial and venous sinus flow voids are preserved. Skull and upper cervical spine: The visualized skull base, calvarium, upper cervical spine and extracranial soft tissues are normal. Sinuses/Orbits: No fluid levels or advanced mucosal thickening. No mastoid or middle ear effusion. Normal orbits. IMPRESSION: 1. No acute ischemia. 2. 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  Electronically Signed   By: Ulyses Jarred M.D.   On: 06/15/2017 05:09   Ct Head Code Stroke Wo Contrast  Result Date: 06/15/2017 CLINICAL DATA:  Code stroke.  Left-sided weakness EXAM: CT HEAD WITHOUT CONTRAST TECHNIQUE: Contiguous axial images were obtained from the base of the skull through the vertex without intravenous contrast. COMPARISON:  Head CT 04/29/2012 FINDINGS: Brain: No mass lesion or acute hemorrhage. Faint focus of hypoattenuation fall within the right precentral gyrus, at the site of the hand motor area. Otherwise no focal parenchymal density abnormality. No hydrocephalus or age advanced atrophy. Incidentally noted partially empty sella. Vascular: No hyperdense vessel. No advanced atherosclerotic calcification of the arteries at the skull base. Skull: Normal visualized skull base, calvarium and extracranial soft tissues. Sinuses/Orbits: No sinus fluid levels or advanced mucosal thickening. No mastoid effusion. Normal orbits. ASPECTS [facility] Stroke Program Early CT Score) - Ganglionic level infarction (caudate, lentiform nuclei, internal capsule, insula, M1-M3 cortex): 6 - Supraganglionic infarction (M4-M6 cortex): 3 Total score (0-10 with 10 being normal): 10 IMPRESSION: 1. No acute hemorrhage or mass lesion. 2. Mild hypoattenuation within the right precentral gyrus is age indeterminate and could be an early finding of ischemia. 3. ASPECTS is 9. These results were communicated to Dr. Karena Addison Aroor at 4:24 am on 06/15/2017 by text page via the [facility] messaging system. Electronically Signed   By: Ulyses Jarred M.D.   On: 06/15/2017 04:24       Discharge Exam: Vitals:   06/18/17 0912 06/18/17 1014  BP: (!) 106/39 123/62  Pulse: (!) 59   Resp: 19   Temp: 98.5 F (36.9 C)   SpO2: 93%    Vitals:   06/18/17 0104 06/18/17 0531 06/18/17 0912 06/18/17 1014  BP: (!) 106/41 (!) 107/57 (!) 106/39 123/62  Pulse:  (!) 58 (!) 59   Resp:  18 19   Temp:  98.7 F (37.1 C) 98.5 F (36.9 C)    TempSrc:  Oral Oral   SpO2:  92% 93%   Weight:  129.8 kg (286 lb 2.5 oz)    Height:        General: Pt is alert, awake, not in acute distress Cardiovascular: RRR, S1/S2 +, no rubs, no gallops Respiratory: CTA bilaterally, no wheezing, no rhonchi Abdominal: Soft, NT, ND, bowel sounds + Extremities: no edema, no cyanosis    The results of significant diagnostics from this hospitalization (including imaging, microbiology, ancillary and laboratory) are listed below for reference.     Microbiology: No results found for this or any previous visit (from the past 240 hour(s)).   Labs: BNP (last 3 results) Recent Labs    06/15/17 0355 06/17/17 1434 06/18/17 0558  BNP 290.1* 133.3* 630.1*   Basic Metabolic Panel: Recent Labs  Lab 06/15/17 0355 06/15/17 0411 06/16/17 0019 06/18/17 0558  NA 137 140 137 137  K 4.1 4.0 3.7 4.0  CL 104 103 104 98*  CO2 23  --  29 30  GLUCOSE 107* 110* 101* 96  BUN 16 19 12 12   CREATININE 1.04* 1.00 1.00 1.44*  CALCIUM 8.9  --  8.2* 9.0   Liver Function Tests: Recent Labs  Lab 06/15/17 0355  AST 35  ALT 21  ALKPHOS 122  BILITOT 0.7  PROT 8.1  ALBUMIN 4.1   No results for input(s): LIPASE, AMYLASE in the last 168 hours. No results for input(s): AMMONIA in the last 168 hours. CBC: Recent Labs  Lab 06/15/17 0355  06/16/17 0019 06/16/17 1201 06/16/17 2357 06/17/17 1227 06/18/17 0558  WBC 5.5  --  5.3  --   --   --  5.8  NEUTROABS 2.7  --   --   --   --   --   --   HGB 12.3   < > 9.8*  9.7* 10.9* 10.3* 11.3* 10.7*  HCT 37.9   < > 32.4*  32.1* 36.7 34.0* 36.6 34.3*  MCV 75.2*  --  75.9*  --   --   --  75.6*  PLT 259  --  224  --   --   --  244   < > = values in this interval not displayed.   Cardiac Enzymes: No results for input(s): CKTOTAL, CKMB, CKMBINDEX, TROPONINI in the last 168 hours. BNP: Invalid input(s): POCBNP CBG: Recent Labs  Lab 06/16/17 2235 06/17/17 0553 06/17/17 1656 06/17/17 2049 06/18/17 0613   GLUCAP 126* 92 234* 134* 97   D-Dimer No results for input(s): DDIMER in the last 72 hours. Hgb A1c No results for input(s): HGBA1C in the last 72 hours. Lipid Profile No results for input(s): CHOL, HDL, LDLCALC, TRIG, CHOLHDL, LDLDIRECT in the last 72 hours. Thyroid function studies No results for input(s): TSH, T4TOTAL, T3FREE, THYROIDAB in the last 72 hours.  Invalid input(s): FREET3 Anemia work up Recent Labs    06/17/17 1434  VITAMINB12 1,376*  FOLATE 46.0  FERRITIN 13  TIBC 434  IRON 29  RETICCTPCT 1.8   Urinalysis    Component Value Date/Time   COLORURINE YELLOW 03/04/2017 1520   APPEARANCEUR HAZY (A) 03/04/2017 1520   LABSPEC 1.001 (L) 03/04/2017 1520   PHURINE 5.0 03/04/2017 1520   GLUCOSEU >=500 (A) 03/04/2017 1520   GLUCOSEU NEGATIVE 04/17/2016 0955   HGBUR NEGATIVE 03/04/2017 [place] 03/04/2017 1520   KETONESUR NEGATIVE 03/04/2017 1520   PROTEINUR NEGATIVE 03/04/2017 1520   UROBILINOGEN 0.2 04/17/2016 0955   NITRITE NEGATIVE 03/04/2017 1520   LEUKOCYTESUR  NEGATIVE 03/04/2017 1520   Sepsis Labs Invalid input(s): PROCALCITONIN,  WBC,  LACTICIDVEN Microbiology No results found for this or any previous visit (from the past 240 hour(s)).   Time coordinating discharge: Over 30 minutes  SIGNED:   Debbe Odea, MD  [facility] 06/18/2017, 11:25 AM Pager   If 7PM-7AM, please contact night-coverage [email or web address] Password [email or web address]

## 2017-06-17 NOTE — Interval H&P Note (Signed)
History and Physical Interval Note: 71/female with painless hematochezia for a diagnostic colonoscopy.  06/17/2017 7:36 AM  [name]  has presented today for colonoscopy, with the diagnosis of hematochezia  The various methods of treatment have been discussed with the patient and family. After consideration of risks, benefits and other options for treatment, the patient has consented to  Procedure(s): COLONOSCOPY WITH PROPOFOL (Left) as a surgical intervention .  The patient's history has been reviewed, patient examined, no change in status, stable for surgery.  I have reviewed the patient's chart and labs.  Questions were answered to the patient's satisfaction.     [name]

## 2017-06-17 NOTE — Transfer of Care (Signed)
Immediate Anesthesia Transfer of Care Note  Patient: [name]  Procedure(s) Performed: COLONOSCOPY WITH PROPOFOL (Left )  Patient Location: Endoscopy Unit  Anesthesia Type:MAC  Level of Consciousness: awake and patient cooperative  Airway & Oxygen Therapy: Patient Spontanous Breathing  Post-op Assessment: Report given to RN and Post -op Vital signs reviewed and stable  Post vital signs: Reviewed and stable  Last Vitals:  Vitals:   06/17/17 0959 06/17/17 1000  BP:  (!) 121/35  Pulse: 89 66  Resp: 17 16  Temp: 36.7 C 36.7 C  SpO2: 95% 95%    Last Pain:  Vitals:   06/17/17 1000  TempSrc: Oral  PainSc:          Complications: No apparent anesthesia complications

## 2017-06-18 ENCOUNTER — Encounter (HOSPITAL_COMMUNITY): Payer: Self-pay | Admitting: Gastroenterology

## 2017-06-18 DIAGNOSIS — G8384 Todd's paralysis (postepileptic): Secondary | ICD-10-CM

## 2017-06-18 LAB — CBC
HCT: 34.3 % — ABNORMAL LOW (ref 36.0–46.0)
Hemoglobin: 10.7 g/dL — ABNORMAL LOW (ref 12.0–15.0)
MCH: 23.6 pg — ABNORMAL LOW (ref 26.0–34.0)
MCHC: 31.2 g/dL (ref 30.0–36.0)
MCV: 75.6 fL — ABNORMAL LOW (ref 78.0–100.0)
Platelets: 244 10*3/uL (ref 150–400)
RBC: 4.54 MIL/uL (ref 3.87–5.11)
RDW: 16.9 % — ABNORMAL HIGH (ref 11.5–15.5)
WBC: 5.8 10*3/uL (ref 4.0–10.5)

## 2017-06-18 LAB — BASIC METABOLIC PANEL
Anion gap: 9 (ref 5–15)
BUN: 12 mg/dL (ref 6–20)
CO2: 30 mmol/L (ref 22–32)
Calcium: 9 mg/dL (ref 8.9–10.3)
Chloride: 98 mmol/L — ABNORMAL LOW (ref 101–111)
Creatinine, Ser: 1.44 mg/dL — ABNORMAL HIGH (ref 0.44–1.00)
GFR calc Af Amer: 42 mL/min — ABNORMAL LOW (ref >60)
GFR calc non Af Amer: 36 mL/min — ABNORMAL LOW (ref >60)
Glucose, Bld: 96 mg/dL (ref 65–99)
Potassium: 4 mmol/L (ref 3.5–5.1)
Sodium: 137 mmol/L (ref 135–145)

## 2017-06-18 LAB — GLUCOSE, CAPILLARY
Glucose-Capillary: 97 mg/dL (ref 65–99)
Glucose-Capillary: 138 mg/dL — ABNORMAL HIGH (ref 65–99)

## 2017-06-18 LAB — BRAIN NATRIURETIC PEPTIDE: B Natriuretic Peptide: 175.4 pg/mL — ABNORMAL HIGH (ref 0.0–100.0)

## 2017-06-18 MED ORDER — CIPROFLOXACIN HCL 500 MG PO TABS: 500.0000 mg | ORAL_TABLET | Freq: Two times a day (BID) | ORAL | 0 refills | Status: DC

## 2017-06-18 MED ORDER — METRONIDAZOLE 500 MG PO TABS: 500.0000 mg | ORAL_TABLET | Freq: Three times a day (TID) | ORAL | 0 refills | Status: DC

## 2017-06-18 MED ORDER — LAMOTRIGINE 100 MG PO TABS: 100.0000 mg | ORAL_TABLET | Freq: Two times a day (BID) | ORAL | 0 refills | Status: DC

## 2017-06-18 NOTE — Progress Notes (Signed)
Patient ready for discharge to home; discharge instructions given and reviewed; Rx's given. Patient discharged out via wheelchair accompanied home by her son.

## 2017-06-20 ENCOUNTER — Encounter: Payer: Self-pay | Admitting: *Deleted

## 2017-06-23 ENCOUNTER — Emergency Department (HOSPITAL_COMMUNITY)
Admission: EM | Admit: 2017-06-23 | Discharge: 2017-06-24 | Disposition: A | Discharge status: Home / Self Care | Payer: Medicare HMO | Attending: Emergency Medicine | Admitting: Emergency Medicine

## 2017-06-23 ENCOUNTER — Emergency Department (HOSPITAL_COMMUNITY): Payer: Medicare HMO

## 2017-06-23 DIAGNOSIS — R739 Hyperglycemia, unspecified: Secondary | ICD-10-CM | POA: Diagnosis not present

## 2017-06-23 DIAGNOSIS — H539 Unspecified visual disturbance: Secondary | ICD-10-CM

## 2017-06-23 DIAGNOSIS — R42 Dizziness and giddiness: Secondary | ICD-10-CM | POA: Diagnosis not present

## 2017-06-23 DIAGNOSIS — E1165 Type 2 diabetes mellitus with hyperglycemia: Secondary | ICD-10-CM | POA: Diagnosis not present

## 2017-06-23 DIAGNOSIS — E785 Hyperlipidemia, unspecified: Secondary | ICD-10-CM | POA: Insufficient documentation

## 2017-06-23 DIAGNOSIS — I252 Old myocardial infarction: Secondary | ICD-10-CM | POA: Insufficient documentation

## 2017-06-23 DIAGNOSIS — I5032 Chronic diastolic (congestive) heart failure: Secondary | ICD-10-CM | POA: Insufficient documentation

## 2017-06-23 DIAGNOSIS — E860 Dehydration: Secondary | ICD-10-CM

## 2017-06-23 DIAGNOSIS — Z87891 Personal history of nicotine dependence: Secondary | ICD-10-CM | POA: Insufficient documentation

## 2017-06-23 DIAGNOSIS — G20 Parkinson's disease: Secondary | ICD-10-CM | POA: Diagnosis not present

## 2017-06-23 DIAGNOSIS — I110 Hypertensive heart disease with heart failure: Secondary | ICD-10-CM | POA: Insufficient documentation

## 2017-06-23 DIAGNOSIS — Z794 Long term (current) use of insulin: Secondary | ICD-10-CM | POA: Insufficient documentation

## 2017-06-23 DIAGNOSIS — Z79899 Other long term (current) drug therapy: Secondary | ICD-10-CM | POA: Diagnosis not present

## 2017-06-23 DIAGNOSIS — R0989 Other specified symptoms and signs involving the circulatory and respiratory systems: Secondary | ICD-10-CM | POA: Diagnosis not present

## 2017-06-23 LAB — BASIC METABOLIC PANEL
Anion gap: 10 (ref 5–15)
BUN: 32 mg/dL — ABNORMAL HIGH (ref 6–20)
CO2: 31 mmol/L (ref 22–32)
Calcium: 9.2 mg/dL (ref 8.9–10.3)
Chloride: 93 mmol/L — ABNORMAL LOW (ref 101–111)
Creatinine, Ser: 1.82 mg/dL — ABNORMAL HIGH (ref 0.44–1.00)
GFR calc Af Amer: 32 mL/min — ABNORMAL LOW (ref >60)
GFR calc non Af Amer: 27 mL/min — ABNORMAL LOW (ref >60)
Glucose, Bld: 400 mg/dL — ABNORMAL HIGH (ref 65–99)
Potassium: 4.3 mmol/L (ref 3.5–5.1)
Sodium: 134 mmol/L — ABNORMAL LOW (ref 135–145)

## 2017-06-23 LAB — CBC
HCT: 38.4 % (ref 36.0–46.0)
Hemoglobin: 12.7 g/dL (ref 12.0–15.0)
MCH: 24.3 pg — ABNORMAL LOW (ref 26.0–34.0)
MCHC: 33.1 g/dL (ref 30.0–36.0)
MCV: 73.6 fL — ABNORMAL LOW (ref 78.0–100.0)
Platelets: 214 10*3/uL (ref 150–400)
RBC: 5.22 MIL/uL — ABNORMAL HIGH (ref 3.87–5.11)
RDW: 16.2 % — ABNORMAL HIGH (ref 11.5–15.5)
WBC: 5.7 10*3/uL (ref 4.0–10.5)

## 2017-06-23 LAB — URINALYSIS, ROUTINE W REFLEX MICROSCOPIC
Bacteria, UA: NONE SEEN
Bilirubin Urine: NEGATIVE
Glucose, UA: >=500 mg/dL — AB
Hgb urine dipstick: NEGATIVE
Ketones, ur: NEGATIVE mg/dL
Leukocytes, UA: NEGATIVE
Nitrite: NEGATIVE
Protein, ur: NEGATIVE mg/dL
Specific Gravity, Urine: 1.018 (ref 1.005–1.030)
pH: 6 (ref 5.0–8.0)

## 2017-06-23 LAB — CBG MONITORING, ED
Glucose-Capillary: 375 mg/dL — ABNORMAL HIGH (ref 65–99)
Glucose-Capillary: 415 mg/dL — ABNORMAL HIGH (ref 65–99)

## 2017-06-23 LAB — I-STAT CHEM 8, ED
BUN: 34 mg/dL — ABNORMAL HIGH (ref 6–20)
Calcium, Ion: 1.16 mmol/L (ref 1.15–1.40)
Chloride: 92 mmol/L — ABNORMAL LOW (ref 101–111)
Creatinine, Ser: 1.8 mg/dL — ABNORMAL HIGH (ref 0.44–1.00)
Glucose, Bld: 389 mg/dL — ABNORMAL HIGH (ref 65–99)
HCT: 43 % (ref 36.0–46.0)
Hemoglobin: 14.6 g/dL (ref 12.0–15.0)
Potassium: 4.1 mmol/L (ref 3.5–5.1)
Sodium: 136 mmol/L (ref 135–145)
TCO2: 33 mmol/L — ABNORMAL HIGH (ref 22–32)

## 2017-06-23 MED ORDER — SODIUM CHLORIDE 0.9 % IV BOLUS (SEPSIS)
1000.0000 mL | Freq: Once | INTRAVENOUS | Status: AC
  Administered 2017-06-23: 1000 mL via INTRAVENOUS

## 2017-06-23 MED ORDER — SODIUM CHLORIDE 0.9 % IV SOLN
Freq: Once | INTRAVENOUS | Status: AC
  Administered 2017-06-23: 21:00:00 via INTRAVENOUS

## 2017-06-23 NOTE — ED Notes (Signed)
Patient is aware a urine sample is needed. Patient stated she should be able to go once she has had some fluids. Patient currently getting IV fluids.

## 2017-06-23 NOTE — ED Triage Notes (Signed)
Pt comes from home c/o of fuzzy vision, and thirst, and dizziness. Pt cbg 423, v/s 80/52, hr 72, spo2 98, room air, rr18. Hx of diabetes, HTN, hyperlipidemia, seizures. Iv 20 left hand 300 ns in.

## 2017-06-23 NOTE — ED Provider Notes (Signed)
[facility] DEPT Provider Note   CSN: [id number] Arrival date & time: 06/23/17  2023     History   Chief Complaint Chief Complaint  Patient presents with  . Hyperglycemia    HPI [name] is a 71 y.o. female.  HPI   71 year old female with a history of hypertension, hyperlipidemia, coronary artery disease, chronic diastolic CHF, CVA, diabetes, seizures, obesity hypoventilation syndrome, who was recently admitted after a code stroke that was thought to be secondary to possible Todd's paralysis from seizure disorder, as well as concern for possible GI bleed, who presents with concern for visual changes, increased thirst, dizziness.  Patient reports that she had an episode where she felt like she lost vision in both eyes, felt dizzy, and afterwards noted some double vision.  During my exam, she reported that the visual changes have improved.  Denied numbness, weakness focally, he denies difficulty talking.  Reports that she has right knee pain.  Denies chest pain or shortness of breath.  Reports she has been taking her Lasix metolazone as prescribed.  Reports that over the last few days, her glucose has been running high, was up above 500 yesterday.  Reports cough for the last 2 days. Reports it has been a "hard cough" today.  Her weight had been in the 280s, but over the last few days has been in the 260s.  Past Medical History:  Diagnosis Date  . Abnormal liver function     in the past.  . Anemia   . Arthritis    all over  . Bruises easily   . Cataract    right eye;immature  . CHF (congestive heart failure) ([place])   . Chronic back pain    stenosis  . Chronic cough   . Demand myocardial infarction [facility]) 2012   Demand Infarction in setting of Pancreatitis --> mild Troponin elevation, Non-obstructive CAD  . Depression    takes Abilify daily as well as Zoloft  . Diastolic heart failure 0240   Grade 1 diastolic Dysfunction by Echo   .  Diverticulosis   . DM (diabetes mellitus) ([place])    takes Victoza daily as well as Lantus and Humalog  . Empty sella ([place])    on MRI in 2009.  . Glaucoma   . Headache(784.0)    last migraine-4-74yr ago  . History of blood transfusion    no abnormal reaction noted  . History of colon polyps    benign  . History of influenza    antibiotic given yesterday along with taking Delsym cough syrup  . HTN (hypertension)    takes BK[employer] Morgan Energydaily  . Hyperlipidemia    takes Lipitor daily  . Joint swelling   . Nocturia   . Obstructive sleep apnea   . Pancreatitis    takes Pancrelipase daily  . Parkinson's disease ([place]    takes Sinemet daily  . Peripheral edema    takes Lasix daily  . Peripheral neuropathy   . Pneumonia 2012  . Seizure disorder ([place]   . Seizures ([place]    takes Lamictal daily and Primidone nightly;last seizure 2wks ago  . Urinary frequency   . Urinary urgency   . Varicose veins of both lower extremities with pain     Patient Active Problem List   Diagnosis Date Noted  . Todd's paralysis ([place]   . Acute diastolic CHF (congestive heart failure) ([place]   . Acute on chronic respiratory failure with hypoxia ([place]   .  Acute lower GI bleeding   . Stroke-like symptoms 06/15/2017  . Rectal bleeding 06/15/2017  . Dehydration 03/07/2017  . Medication management 12/24/2016  . Pre-syncope 03/09/2016  . Racing heart beat 03/09/2016  . Spinal stenosis at L4-L5 level 10/04/2014  . Lower abdominal pain 10/31/2012  . Type II diabetes mellitus, uncontrolled ([place]) 10/31/2012  . Seizure disorder ([place]) 10/31/2012  . Diverticulosis 10/31/2012  . Anemia associated with acute blood loss 08/14/2012  . Hematochezia 08/14/2012  . Varicose veins of bilateral lower extremities with other complications 53/29/9242  . Leg pain 03/21/2012  . Radicular pain of left lower extremity 03/21/2012  . Infected pseudocyst of pancreas 10/28/2011  . Pancreatitis chronic 10/28/2011    . Hypertensive heart disease 10/28/2011  . Chronic respiratory failure ([place]) 10/28/2011  . Dyslipidemia, goal LDL below 100 10/28/2011  . Morbid obesity with BMI of 50.0-59.9, adult ([place]) 10/28/2011  . Peripheral edema 04/14/2011  . Obesity hypoventilation syndrome ([place]) 10/26/2010  . Coronary artery disease, non-occlusive 08/28/2010    Past Surgical History:  Procedure Laterality Date  . ABDOMINAL HYSTERECTOMY    . APPENDECTOMY    . BACK SURGERY    . CARDIAC CATHETERIZATION  2009/March 2012   2009: (Dr. Alvie Heidelberg) Nonobstructive CAD; 2012: Minimal CAD --> false positive stress test  . Cardiac Event Monitor  September-October 2017   Sinus rhythm with occasional PACs and artifact. No arrhythmias besides one short run of tachycardia.  . CHOLECYSTECTOMY    . COLONOSCOPY N/A 08/15/2012   Procedure: COLONOSCOPY;  Surgeon: Arta Silence, MD;  Location: [facility];  Service: Endoscopy;  Laterality: N/A;  . COLONOSCOPY WITH PROPOFOL Left 06/17/2017   Procedure: COLONOSCOPY WITH PROPOFOL;  Surgeon: [name], MD;  Location: [facility];  Service: Gastroenterology;  Laterality: Left;  . ESOPHAGOGASTRODUODENOSCOPY    . eye cysts Bilateral   . LASIK    . NM MYOVIEW LTD  March 2012; Aug 2017   Probably normal Lexiscan Myoview. Mild reversible Anterior Defect ~ Breast Attenuation. (CRO ischemia):  FALSE POSITIVE by Cath; b) 8/'17: LOW RISK, No ischemia or Infarction.  Breast Attenuation not noted.  Marland Kitchen NM MYOVIEW LTD  01/2016   LOW RISK. No ischemia or infarction.  Marland Kitchen RECTAL POLYPECTOMY    . TONSILLECTOMY    . TRANSTHORACIC ECHOCARDIOGRAM  01/2016   Normal LV chamber size with moderate LVH pattern. EF 50-55%. Severe LA dilation. Moderate RA dilation. PA pressure elevated at 38 mmHg (mild as (  . TUBAL LIGATION    . vaginal cyst removed     several times    OB History    No data available       Home Medications    Prior to Admission medications   Medication Sig Start Date End Date  Taking? Authorizing Provider  ACCU-CHEK FASTCLIX LANCETS MISC USE  TO CHECK BLOOD SUGAR THREE TIMES DAILY 06/24/15   Elayne Snare, MD  ACCU-CHEK SMARTVIEW test strip CHECK BLOOD SUGAR THREE TIMES DAILY 04/17/16   Elayne Snare, MD  acetaminophen (TYLENOL) 500 MG tablet Take 500-1,000 mg by mouth every 4 (four) hours as needed (for pain).     [provider]  Alcohol Swabs (B-D SINGLE USE SWABS REGULAR) PADS Use as directed 12/22/15   Elayne Snare, MD  atorvastatin (LIPITOR) 10 MG tablet Take 10 mg by mouth daily.    [provider]  B Complex-C (B-COMPLEX WITH VITAMIN C) tablet Take 1 tablet by mouth daily.    [provider]  Blood Glucose Monitoring Suppl (ACCU-CHEK  NANO SMARTVIEW) W/DEVICE KIT Use to check blood sugar 3 times per day dx code E11.65 07/19/14   Elayne Snare, MD  carbidopa-levodopa (SINEMET IR) 25-100 MG per tablet Take 1 tablet by mouth 3 (three) times daily.  02/11/13   [provider]  carvedilol (COREG) 25 MG tablet Take 1 tablet (25 mg total) by mouth 2 (two) times daily. 06/11/16 09/13/21  Leonie Man, MD  Cholecalciferol (VITAMIN D-3) 1000 units CAPS Take 1,000 Units by mouth daily.    [provider]  cholestyramine Lucrezia Starch) 4 G packet Take 4 g by mouth 2 (two) times daily as needed (for diarrhea).     [provider]  ciprofloxacin (CIPRO) 500 MG tablet Take 1 tablet (500 mg total) by mouth 2 (two) times daily. 06/18/17   Debbe Odea, MD  furosemide (LASIX) 40 MG tablet Take 1 tablet (40 mg total) by mouth daily. 03/05/17   Waynetta Pean, PA-C  insulin aspart (NOVOLOG FLEXPEN) 100 UNIT/ML FlexPen Inject4- 6 units before breakfast and lunch and 10-12 units before dinner Patient taking differently: Inject 6-10 Units into the skin See admin instructions. 6 units before breakfast then 6 units before lunch then 10 units before dinner (evening meal) 04/17/16   Elayne Snare, MD  Insulin Glargine (TOUJEO SOLOSTAR) 300 UNIT/ML  SOPN Inject 40 Units into the skin daily. Patient taking differently: Inject 36 Units into the skin at bedtime.  04/17/16   Elayne Snare, MD  Insulin Pen Needle 31G X 8 MM MISC Use to inject medication 4 times daily 12/22/15   Elayne Snare, MD  isosorbide mononitrate (IMDUR) 30 MG 24 hr tablet TAKE 1 TABLET (30 MG TOTAL) BY MOUTH DAILY. 07/16/16   Leonie Man, MD  JARDIANCE 10 MG TABS tablet TAKE ONE TABLET BY MOUTH ONCE DAILY WITH  BREAKFAST Patient taking differently: Take 10 mg by mouth once a day with breakfast 08/14/16   Elayne Snare, MD  lamoTRIgine (LAMICTAL) 100 MG tablet Take 1 tablet (100 mg total) by mouth 2 (two) times daily. 06/18/17   Debbe Odea, MD  Menthol, Topical Analgesic, 3.5 % GEL Apply 1 application topically as needed (for neuropathy in feet).     [provider]  metolazone (ZAROXOLYN) 2.5 MG tablet Take 1 tablet (2.5 mg total) by mouth once a week. Patient taking differently: Take 2.5 mg by mouth every Saturday.  01/29/17 06/15/17  Barrett, Evelene Croon, PA-C  metroNIDAZOLE (FLAGYL) 500 MG tablet Take 1 tablet (500 mg total) by mouth 3 (three) times daily. 06/18/17   Debbe Odea, MD  Multiple Vitamins-Minerals (MULTIVITAMIN WITH MINERALS) tablet Take 1 tablet by mouth daily.    [provider]  olmesartan (BENICAR) 40 MG tablet Take 40 mg by mouth daily.    [provider]  Pancrelipase, Lip-Prot-Amyl, 25000 UNITS CPEP Take 25,000-50,000 Units by mouth See admin instructions. 50,000 units with breakfast then 25,000 units with lunch then 50,000 units with dinner (evening meal)    [provider]  potassium chloride SA (K-DUR,KLOR-CON) 20 MEQ tablet Take 2 tablets (40 mEq total) by mouth daily. On the day you take metolazone, take an extra 20 meq (60 meq total) Patient taking differently: Take 40 mEq by mouth daily.  02/01/17   Almyra Deforest, PA  pregabalin (LYRICA) 100 MG capsule Take 100 mg by mouth 2 (two) times daily.    [provider]   sertraline (ZOLOFT) 50 MG tablet Take 50 mg by mouth every evening.     [provider]  VICTOZA 18 MG/3ML SOPN INJECT 1.8MG SUBCUTANEOUSLY DAILY (NEEDS APPOINTMENT FOR REFILLS) Patient taking differently: Inject 1.8 mg into the skin daily at lunchtime 10/05/16   Elayne Snare, MD    Family History Family History  Problem Relation Age of Onset  . Allergies Father   . Heart disease Father        before 57  . Heart failure Father   . Hypertension Father   . Hyperlipidemia Father   . Heart disease Mother   . Diabetes Mother   . Hypertension Mother   . Hyperlipidemia Mother   . Hypertension Sister   . Heart disease Sister        before 79  . Hyperlipidemia Sister   . Diabetes Brother   . Hypertension Brother   . Diabetes Sister   . Hypertension Sister   . Diabetes Son   . Hypertension Son   . Cancer Other     Social History Social History   Tobacco Use  . Smoking status: Former Smoker    Packs/day: 0.50    Years: 25.00    Pack years: 12.50    Types: Cigarettes  . Smokeless tobacco: Never Used  . Tobacco comment: quit smoking 20+ytrs ago  Substance Use Topics  . Alcohol use: No  . Drug use: No     Allergies   Penicillins; Sulfa antibiotics; Codeine; and Other   Review of Systems Review of Systems  Constitutional: Positive for fatigue. Negative for fever.  HENT: Negative for sore throat.   Eyes: Positive for visual disturbance.  Respiratory: Positive for cough. Negative for shortness of breath.   Cardiovascular: Negative for chest pain.  Gastrointestinal: Positive for nausea. Negative for abdominal pain, constipation, diarrhea and vomiting.  Genitourinary: Positive for frequency. Negative for difficulty urinating.  Musculoskeletal: Positive for arthralgias. Negative for neck pain.  Skin: Negative for rash.  Neurological: Positive for light-headedness. Negative for syncope, facial asymmetry, weakness, numbness and headaches.     Physical  Exam Updated Vital Signs BP (!) 125/55 (BP Location: Left Arm)   Pulse 74   Temp 98.5 F (36.9 C) (Oral)   Resp 13   Ht 5' 1"  (1.549 m)   Wt 119.7 kg (264 lb)   SpO2 92%   BMI 49.88 kg/m   Physical Exam  Constitutional: She is oriented to person, place, and time. She appears well-developed and well-nourished. No distress.  Appears fatigued   HENT:  Head: Normocephalic and atraumatic.  Eyes: Conjunctivae and EOM are normal. Pupils are equal, round, and reactive to light.  Neck: Normal range of motion.  Cardiovascular: Normal rate, regular rhythm, normal heart sounds and intact distal pulses. Exam reveals no gallop and no friction rub.  No murmur heard. Pulmonary/Chest: Effort normal and breath sounds normal. No respiratory distress. She has no wheezes. She has no rales.  Abdominal: Soft. She exhibits no distension. There is no tenderness. There is no guarding.  Musculoskeletal: She exhibits no edema or tenderness.  Neurological: She is alert and oriented to person, place, and time. She has normal strength. No cranial nerve deficit or sensory deficit. Coordination normal. GCS eye subscore is 4. GCS verbal subscore is 5. GCS motor subscore is 6.  Skin: Skin is warm and dry. No rash noted. She is not diaphoretic. No erythema.  Nursing note and vitals reviewed.    ED Treatments / Results  Labs (all labs ordered are listed, but only abnormal results are displayed) Labs Reviewed  BASIC METABOLIC PANEL - Abnormal; Notable for  the following components:      Result Value   Sodium 134 (*)    Chloride 93 (*)    Glucose, Bld 400 (*)    BUN 32 (*)    Creatinine, Ser 1.82 (*)    GFR calc non Af Amer 27 (*)    GFR calc Af Amer 32 (*)    All other components within normal limits  CBC - Abnormal; Notable for the following components:   RBC 5.22 (*)    MCV 73.6 (*)    MCH 24.3 (*)    RDW 16.2 (*)    All other components within normal limits  URINALYSIS, ROUTINE W REFLEX MICROSCOPIC -  Abnormal; Notable for the following components:   Glucose, UA >=500 (*)    Squamous Epithelial / LPF 0-5 (*)    All other components within normal limits  CBG MONITORING, ED - Abnormal; Notable for the following components:   Glucose-Capillary 375 (*)    All other components within normal limits  CBG MONITORING, ED - Abnormal; Notable for the following components:   Glucose-Capillary 415 (*)    All other components within normal limits  I-STAT CHEM 8, ED - Abnormal; Notable for the following components:   Chloride 92 (*)    BUN 34 (*)    Creatinine, Ser 1.80 (*)    Glucose, Bld 389 (*)    TCO2 33 (*)    All other components within normal limits  CBG MONITORING, ED - Abnormal; Notable for the following components:   Glucose-Capillary 404 (*)    All other components within normal limits  BRAIN NATRIURETIC PEPTIDE  CBG MONITORING, ED    EKG  EKG Interpretation None       Radiology Dg Chest 2 View  Result Date: 06/24/2017 CLINICAL DATA:  Hypertension and seizures. Fussy vision, thirst, and dizziness. EXAM: CHEST  2 VIEW COMPARISON:  06/17/2017 FINDINGS: Shallow inspiration. Cardiac enlargement with mild vascular congestion. No edema or consolidation. No blunting of costophrenic angles. No pneumothorax. Mediastinal contours appear intact. IMPRESSION: Cardiac enlargement with mild vascular congestion. No pulmonary edema or consolidation. Electronically Signed   By: Lucienne Capers M.D.   On: 06/24/2017 00:15   Ct Head Wo Contrast  Result Date: 06/24/2017 CLINICAL DATA:  Acute onset of dizziness and fuzzy vision. EXAM: CT HEAD WITHOUT CONTRAST TECHNIQUE: Contiguous axial images were obtained from the base of the skull through the vertex without intravenous contrast. COMPARISON:  MRI of the brain and CT of the head performed 06/15/2017 FINDINGS: Brain: No evidence of acute infarction, hemorrhage, hydrocephalus, extra-axial collection or mass lesion/mass effect. Prominence of the  sulci suggests mild cortical volume loss. A small chronic infarct is again noted at the right parietal lobe. The brainstem and fourth ventricle are within normal limits. The basal ganglia are unremarkable in appearance. The cerebral hemispheres demonstrate grossly normal gray-white differentiation. No mass effect or midline shift is seen. Vascular: No hyperdense vessel or unexpected calcification. Skull: There is no evidence of fracture; visualized osseous structures are unremarkable in appearance. Sinuses/Orbits: Mild proptosis is noted. The orbits are otherwise grossly unremarkable. The paranasal sinuses and mastoid air cells are well-aerated. Other: No significant soft tissue abnormalities are seen. IMPRESSION: 1. No acute intracranial pathology seen on CT. 2. Mild cortical volume loss. Small chronic infarct at the right parietal lobe. 3. Mild proptosis noted. Electronically Signed   By: Garald Balding M.D.   On: 06/24/2017 00:32    Procedures Procedures (including critical care time)  Medications  Ordered in ED Medications  0.9 %  sodium chloride infusion ( Intravenous Stopped 06/23/17 2116)  0.9 %  sodium chloride infusion ( Intravenous Stopped 06/23/17 2116)  sodium chloride 0.9 % bolus 1,000 mL (1,000 mLs Intravenous New Bag/Given 06/23/17 2115)  sodium chloride 0.9 % bolus 1,000 mL (1,000 mLs Intravenous New Bag/Given 06/23/17 2115)  insulin glargine (LANTUS) injection 36 Units (36 Units Subcutaneous Given 06/24/17 0049)     Initial Impression / Assessment and Plan / ED Course  I have reviewed the triage vital signs and the nursing notes.  Pertinent labs & imaging results that were available during my care of the patient were reviewed by me and considered in my medical decision making (see chart for details).     71 year old female with a history of hypertension, hyperlipidemia, coronary artery disease, chronic diastolic CHF, CVA, diabetes, seizures, obesity hypoventilation syndrome,  who was recently admitted after a code stroke that was thought to be secondary to possible Todd's paralysis from seizure disorder, as well as concern for possible GI bleed, who presents with concern for visual changes, increased thirst, dizziness.  Patient recently had an increase in her Lasix dosing, which was continued when she left the hospital, and reports she has had increased glucose over the last several days.  Blood pressures on arrival her 24E systolic.  Suspect patient most likely had visual changes secondary to hypotension, and that hypertension is likely secondary to hypovolemia.  She was given IV fluids with improvement of blood pressures and improvement in visual symptoms.  Her neurologic exam is normal, she has no sign of CVA on exam.  Labs show no anemia, no significant electrolyte problems. Mild Cr elevation from prior also likely secondary to dehydration.  Did order head CT to evaluate visual changes which showed no acute abnormality.  XR done after fluid administration shows no pulmonary edema or pneumonia. UA without infection. No chest pain or dyspnea and doubt PE/ACS. Of note, patient with blood pressures 100s/50s prior to discharge last week and following fluid administration blood pressures similar and up to 185 systolic.  Gave nightly glargine, discussed need for close follow up of glucose.  Noted to have history of being on oxygen 6 years ago, and hospitalist at discharge had noted she likely has a degree of chronic hypoxia.  She also has OSA and doesn't use CPAP.  Her O2 in the ED noted to be 91% on room air, would drop to 88% while sleeping. Given prior history of hypoxia, patient without dyspnea, and sister reporting they are arranging home O2 for her, I feel she is stable for follow up and does not require admission or further work up for this. Recommend she resume her lasix tomorrow as prescribed and follow up closely with PCP as scheduled Wednesday and with Cardiology. Discussed  need to return to ED if she has any new or worsening symptoms.   Final Clinical Impressions(s) / ED Diagnoses   Final diagnoses:  Hyperglycemia  Visual changes  Dehydration    ED Discharge Orders    None       Gareth Morgan, MD 06/24/17 [phone]

## 2017-06-23 NOTE — ED Notes (Signed)
Urine culture sent to lab if needed 

## 2017-06-24 DIAGNOSIS — I5032 Chronic diastolic (congestive) heart failure: Secondary | ICD-10-CM | POA: Diagnosis not present

## 2017-06-24 DIAGNOSIS — K5790 Diverticulosis of intestine, part unspecified, without perforation or abscess without bleeding: Secondary | ICD-10-CM | POA: Diagnosis not present

## 2017-06-24 DIAGNOSIS — D649 Anemia, unspecified: Secondary | ICD-10-CM | POA: Diagnosis not present

## 2017-06-24 DIAGNOSIS — M1990 Unspecified osteoarthritis, unspecified site: Secondary | ICD-10-CM | POA: Diagnosis not present

## 2017-06-24 DIAGNOSIS — G40909 Epilepsy, unspecified, not intractable, without status epilepticus: Secondary | ICD-10-CM | POA: Diagnosis not present

## 2017-06-24 DIAGNOSIS — I110 Hypertensive heart disease with heart failure: Secondary | ICD-10-CM | POA: Diagnosis not present

## 2017-06-24 DIAGNOSIS — E1142 Type 2 diabetes mellitus with diabetic polyneuropathy: Secondary | ICD-10-CM | POA: Diagnosis not present

## 2017-06-24 DIAGNOSIS — G20 Parkinson's disease: Secondary | ICD-10-CM | POA: Diagnosis not present

## 2017-06-24 DIAGNOSIS — I2510 Atherosclerotic heart disease of native coronary artery without angina pectoris: Secondary | ICD-10-CM | POA: Diagnosis not present

## 2017-06-24 DIAGNOSIS — R42 Dizziness and giddiness: Secondary | ICD-10-CM | POA: Diagnosis not present

## 2017-06-24 LAB — BRAIN NATRIURETIC PEPTIDE: B Natriuretic Peptide: 69.9 pg/mL (ref 0.0–100.0)

## 2017-06-24 LAB — CBG MONITORING, ED: Glucose-Capillary: 404 mg/dL — ABNORMAL HIGH (ref 65–99)

## 2017-06-24 MED ORDER — INSULIN GLARGINE 100 UNIT/ML ~~LOC~~ SOLN
36.0000 [IU] | Freq: Once | SUBCUTANEOUS | Status: AC
  Administered 2017-06-24: 36 [IU] via SUBCUTANEOUS
  Filled 2017-06-24: qty 0.36

## 2017-06-25 DIAGNOSIS — K5790 Diverticulosis of intestine, part unspecified, without perforation or abscess without bleeding: Secondary | ICD-10-CM | POA: Diagnosis not present

## 2017-06-25 DIAGNOSIS — G40909 Epilepsy, unspecified, not intractable, without status epilepticus: Secondary | ICD-10-CM | POA: Diagnosis not present

## 2017-06-25 DIAGNOSIS — D649 Anemia, unspecified: Secondary | ICD-10-CM | POA: Diagnosis not present

## 2017-06-25 DIAGNOSIS — E1142 Type 2 diabetes mellitus with diabetic polyneuropathy: Secondary | ICD-10-CM | POA: Diagnosis not present

## 2017-06-25 DIAGNOSIS — M1990 Unspecified osteoarthritis, unspecified site: Secondary | ICD-10-CM | POA: Diagnosis not present

## 2017-06-25 DIAGNOSIS — I110 Hypertensive heart disease with heart failure: Secondary | ICD-10-CM | POA: Diagnosis not present

## 2017-06-25 DIAGNOSIS — I2510 Atherosclerotic heart disease of native coronary artery without angina pectoris: Secondary | ICD-10-CM | POA: Diagnosis not present

## 2017-06-25 DIAGNOSIS — I5032 Chronic diastolic (congestive) heart failure: Secondary | ICD-10-CM | POA: Diagnosis not present

## 2017-06-25 DIAGNOSIS — G20 Parkinson's disease: Secondary | ICD-10-CM | POA: Diagnosis not present

## 2017-06-26 DIAGNOSIS — D649 Anemia, unspecified: Secondary | ICD-10-CM | POA: Diagnosis not present

## 2017-06-26 DIAGNOSIS — I5033 Acute on chronic diastolic (congestive) heart failure: Secondary | ICD-10-CM | POA: Diagnosis not present

## 2017-06-26 DIAGNOSIS — K922 Gastrointestinal hemorrhage, unspecified: Secondary | ICD-10-CM | POA: Diagnosis not present

## 2017-06-26 DIAGNOSIS — R0602 Shortness of breath: Secondary | ICD-10-CM | POA: Diagnosis not present

## 2017-06-26 DIAGNOSIS — G20 Parkinson's disease: Secondary | ICD-10-CM | POA: Diagnosis not present

## 2017-06-26 DIAGNOSIS — E1165 Type 2 diabetes mellitus with hyperglycemia: Secondary | ICD-10-CM | POA: Diagnosis not present

## 2017-06-26 DIAGNOSIS — I129 Hypertensive chronic kidney disease with stage 1 through stage 4 chronic kidney disease, or unspecified chronic kidney disease: Secondary | ICD-10-CM | POA: Diagnosis not present

## 2017-06-26 DIAGNOSIS — N08 Glomerular disorders in diseases classified elsewhere: Secondary | ICD-10-CM | POA: Diagnosis not present

## 2017-06-26 DIAGNOSIS — R569 Unspecified convulsions: Secondary | ICD-10-CM | POA: Diagnosis not present

## 2017-06-26 DIAGNOSIS — R0600 Dyspnea, unspecified: Secondary | ICD-10-CM | POA: Diagnosis not present

## 2017-06-26 DIAGNOSIS — Z79899 Other long term (current) drug therapy: Secondary | ICD-10-CM | POA: Diagnosis not present

## 2017-06-27 DIAGNOSIS — I110 Hypertensive heart disease with heart failure: Secondary | ICD-10-CM | POA: Diagnosis not present

## 2017-06-27 DIAGNOSIS — G40909 Epilepsy, unspecified, not intractable, without status epilepticus: Secondary | ICD-10-CM | POA: Diagnosis not present

## 2017-06-27 DIAGNOSIS — D649 Anemia, unspecified: Secondary | ICD-10-CM | POA: Diagnosis not present

## 2017-06-27 DIAGNOSIS — I2510 Atherosclerotic heart disease of native coronary artery without angina pectoris: Secondary | ICD-10-CM | POA: Diagnosis not present

## 2017-06-27 DIAGNOSIS — E1142 Type 2 diabetes mellitus with diabetic polyneuropathy: Secondary | ICD-10-CM | POA: Diagnosis not present

## 2017-06-27 DIAGNOSIS — I5032 Chronic diastolic (congestive) heart failure: Secondary | ICD-10-CM | POA: Diagnosis not present

## 2017-06-27 DIAGNOSIS — G20 Parkinson's disease: Secondary | ICD-10-CM | POA: Diagnosis not present

## 2017-06-27 DIAGNOSIS — M1990 Unspecified osteoarthritis, unspecified site: Secondary | ICD-10-CM | POA: Diagnosis not present

## 2017-06-27 DIAGNOSIS — K5790 Diverticulosis of intestine, part unspecified, without perforation or abscess without bleeding: Secondary | ICD-10-CM | POA: Diagnosis not present

## 2017-07-01 DIAGNOSIS — I2510 Atherosclerotic heart disease of native coronary artery without angina pectoris: Secondary | ICD-10-CM | POA: Diagnosis not present

## 2017-07-01 DIAGNOSIS — M1990 Unspecified osteoarthritis, unspecified site: Secondary | ICD-10-CM | POA: Diagnosis not present

## 2017-07-01 DIAGNOSIS — D649 Anemia, unspecified: Secondary | ICD-10-CM | POA: Diagnosis not present

## 2017-07-01 DIAGNOSIS — K5790 Diverticulosis of intestine, part unspecified, without perforation or abscess without bleeding: Secondary | ICD-10-CM | POA: Diagnosis not present

## 2017-07-01 DIAGNOSIS — E1142 Type 2 diabetes mellitus with diabetic polyneuropathy: Secondary | ICD-10-CM | POA: Diagnosis not present

## 2017-07-01 DIAGNOSIS — I110 Hypertensive heart disease with heart failure: Secondary | ICD-10-CM | POA: Diagnosis not present

## 2017-07-01 DIAGNOSIS — I5032 Chronic diastolic (congestive) heart failure: Secondary | ICD-10-CM | POA: Diagnosis not present

## 2017-07-01 DIAGNOSIS — G20 Parkinson's disease: Secondary | ICD-10-CM | POA: Diagnosis not present

## 2017-07-01 DIAGNOSIS — G40909 Epilepsy, unspecified, not intractable, without status epilepticus: Secondary | ICD-10-CM | POA: Diagnosis not present

## 2017-07-02 DIAGNOSIS — E1142 Type 2 diabetes mellitus with diabetic polyneuropathy: Secondary | ICD-10-CM | POA: Diagnosis not present

## 2017-07-02 DIAGNOSIS — M1990 Unspecified osteoarthritis, unspecified site: Secondary | ICD-10-CM | POA: Diagnosis not present

## 2017-07-02 DIAGNOSIS — D649 Anemia, unspecified: Secondary | ICD-10-CM | POA: Diagnosis not present

## 2017-07-02 DIAGNOSIS — I5032 Chronic diastolic (congestive) heart failure: Secondary | ICD-10-CM | POA: Diagnosis not present

## 2017-07-02 DIAGNOSIS — G20 Parkinson's disease: Secondary | ICD-10-CM | POA: Diagnosis not present

## 2017-07-02 DIAGNOSIS — G40909 Epilepsy, unspecified, not intractable, without status epilepticus: Secondary | ICD-10-CM | POA: Diagnosis not present

## 2017-07-02 DIAGNOSIS — I2510 Atherosclerotic heart disease of native coronary artery without angina pectoris: Secondary | ICD-10-CM | POA: Diagnosis not present

## 2017-07-02 DIAGNOSIS — I110 Hypertensive heart disease with heart failure: Secondary | ICD-10-CM | POA: Diagnosis not present

## 2017-07-02 DIAGNOSIS — K5790 Diverticulosis of intestine, part unspecified, without perforation or abscess without bleeding: Secondary | ICD-10-CM | POA: Diagnosis not present

## 2017-07-03 ENCOUNTER — Telehealth: Payer: Self-pay | Admitting: Cardiology

## 2017-07-03 DIAGNOSIS — E1142 Type 2 diabetes mellitus with diabetic polyneuropathy: Secondary | ICD-10-CM | POA: Diagnosis not present

## 2017-07-03 DIAGNOSIS — K5790 Diverticulosis of intestine, part unspecified, without perforation or abscess without bleeding: Secondary | ICD-10-CM | POA: Diagnosis not present

## 2017-07-03 DIAGNOSIS — I2510 Atherosclerotic heart disease of native coronary artery without angina pectoris: Secondary | ICD-10-CM | POA: Diagnosis not present

## 2017-07-03 DIAGNOSIS — G20 Parkinson's disease: Secondary | ICD-10-CM | POA: Diagnosis not present

## 2017-07-03 DIAGNOSIS — I110 Hypertensive heart disease with heart failure: Secondary | ICD-10-CM | POA: Diagnosis not present

## 2017-07-03 DIAGNOSIS — D649 Anemia, unspecified: Secondary | ICD-10-CM | POA: Diagnosis not present

## 2017-07-03 DIAGNOSIS — M1990 Unspecified osteoarthritis, unspecified site: Secondary | ICD-10-CM | POA: Diagnosis not present

## 2017-07-03 DIAGNOSIS — G40909 Epilepsy, unspecified, not intractable, without status epilepticus: Secondary | ICD-10-CM | POA: Diagnosis not present

## 2017-07-03 DIAGNOSIS — I5032 Chronic diastolic (congestive) heart failure: Secondary | ICD-10-CM | POA: Diagnosis not present

## 2017-07-03 MED ORDER — ISOSORBIDE MONONITRATE ER 30 MG PO TB24: 30.0000 mg | ORAL_TABLET | Freq: Every day | ORAL | 5 refills | Status: DC

## 2017-07-03 MED ORDER — CARVEDILOL 25 MG PO TABS: 25.0000 mg | ORAL_TABLET | Freq: Two times a day (BID) | ORAL | 5 refills | Status: DC

## 2017-07-03 MED ORDER — POTASSIUM CHLORIDE CRYS ER 20 MEQ PO TBCR: 40.0000 meq | EXTENDED_RELEASE_TABLET | Freq: Every day | ORAL | 5 refills | Status: DC

## 2017-07-03 MED ORDER — FUROSEMIDE 40 MG PO TABS: 40.0000 mg | ORAL_TABLET | Freq: Every day | ORAL | 5 refills | Status: DC

## 2017-07-03 NOTE — Telephone Encounter (Signed)
Returned call to [name] (patient's sister & POA). She states patient got confused on her medication(s) - carvedilol. She states patient was getting this med from 2 different pharmacies and she was trying to straighten this out. She needed to verify dose/instructions. She also needs refills for carvedilol, imdur, lasix, metolazone sent to [place]. Rx(s) sent to pharmacy electronically.

## 2017-07-03 NOTE — Telephone Encounter (Signed)
Patient calling, states that she needs some instructions in regards to her medications. Patient is confused and asked that you contact her sister [name])

## 2017-07-04 DIAGNOSIS — I2510 Atherosclerotic heart disease of native coronary artery without angina pectoris: Secondary | ICD-10-CM | POA: Diagnosis not present

## 2017-07-04 DIAGNOSIS — I5032 Chronic diastolic (congestive) heart failure: Secondary | ICD-10-CM | POA: Diagnosis not present

## 2017-07-04 DIAGNOSIS — E1142 Type 2 diabetes mellitus with diabetic polyneuropathy: Secondary | ICD-10-CM | POA: Diagnosis not present

## 2017-07-04 DIAGNOSIS — I110 Hypertensive heart disease with heart failure: Secondary | ICD-10-CM | POA: Diagnosis not present

## 2017-07-04 DIAGNOSIS — D649 Anemia, unspecified: Secondary | ICD-10-CM | POA: Diagnosis not present

## 2017-07-04 DIAGNOSIS — K5790 Diverticulosis of intestine, part unspecified, without perforation or abscess without bleeding: Secondary | ICD-10-CM | POA: Diagnosis not present

## 2017-07-04 DIAGNOSIS — M1990 Unspecified osteoarthritis, unspecified site: Secondary | ICD-10-CM | POA: Diagnosis not present

## 2017-07-04 DIAGNOSIS — G20 Parkinson's disease: Secondary | ICD-10-CM | POA: Diagnosis not present

## 2017-07-04 DIAGNOSIS — G40909 Epilepsy, unspecified, not intractable, without status epilepticus: Secondary | ICD-10-CM | POA: Diagnosis not present

## 2017-07-05 DIAGNOSIS — D649 Anemia, unspecified: Secondary | ICD-10-CM | POA: Diagnosis not present

## 2017-07-05 DIAGNOSIS — K5790 Diverticulosis of intestine, part unspecified, without perforation or abscess without bleeding: Secondary | ICD-10-CM | POA: Diagnosis not present

## 2017-07-05 DIAGNOSIS — I110 Hypertensive heart disease with heart failure: Secondary | ICD-10-CM | POA: Diagnosis not present

## 2017-07-05 DIAGNOSIS — M1990 Unspecified osteoarthritis, unspecified site: Secondary | ICD-10-CM | POA: Diagnosis not present

## 2017-07-05 DIAGNOSIS — I5032 Chronic diastolic (congestive) heart failure: Secondary | ICD-10-CM | POA: Diagnosis not present

## 2017-07-05 DIAGNOSIS — G40909 Epilepsy, unspecified, not intractable, without status epilepticus: Secondary | ICD-10-CM | POA: Diagnosis not present

## 2017-07-05 DIAGNOSIS — E1142 Type 2 diabetes mellitus with diabetic polyneuropathy: Secondary | ICD-10-CM | POA: Diagnosis not present

## 2017-07-05 DIAGNOSIS — I2510 Atherosclerotic heart disease of native coronary artery without angina pectoris: Secondary | ICD-10-CM | POA: Diagnosis not present

## 2017-07-05 DIAGNOSIS — G20 Parkinson's disease: Secondary | ICD-10-CM | POA: Diagnosis not present

## 2017-07-08 ENCOUNTER — Encounter: Payer: Self-pay | Admitting: Physician Assistant

## 2017-07-08 ENCOUNTER — Telehealth: Payer: Self-pay | Admitting: *Deleted

## 2017-07-08 ENCOUNTER — Ambulatory Visit: Payer: Medicare HMO | Admitting: Physician Assistant

## 2017-07-08 VITALS — BP 132/74 | HR 63 | Ht 65.0 in | Wt 272.0 lb

## 2017-07-08 DIAGNOSIS — M1990 Unspecified osteoarthritis, unspecified site: Secondary | ICD-10-CM | POA: Diagnosis not present

## 2017-07-08 DIAGNOSIS — I10 Essential (primary) hypertension: Secondary | ICD-10-CM | POA: Diagnosis not present

## 2017-07-08 DIAGNOSIS — D649 Anemia, unspecified: Secondary | ICD-10-CM | POA: Diagnosis not present

## 2017-07-08 DIAGNOSIS — G4730 Sleep apnea, unspecified: Secondary | ICD-10-CM

## 2017-07-08 DIAGNOSIS — G40909 Epilepsy, unspecified, not intractable, without status epilepticus: Secondary | ICD-10-CM | POA: Diagnosis not present

## 2017-07-08 DIAGNOSIS — I2510 Atherosclerotic heart disease of native coronary artery without angina pectoris: Secondary | ICD-10-CM | POA: Diagnosis not present

## 2017-07-08 DIAGNOSIS — K5790 Diverticulosis of intestine, part unspecified, without perforation or abscess without bleeding: Secondary | ICD-10-CM | POA: Diagnosis not present

## 2017-07-08 DIAGNOSIS — I5032 Chronic diastolic (congestive) heart failure: Secondary | ICD-10-CM

## 2017-07-08 DIAGNOSIS — E1142 Type 2 diabetes mellitus with diabetic polyneuropathy: Secondary | ICD-10-CM | POA: Diagnosis not present

## 2017-07-08 DIAGNOSIS — G20 Parkinson's disease: Secondary | ICD-10-CM | POA: Diagnosis not present

## 2017-07-08 DIAGNOSIS — I110 Hypertensive heart disease with heart failure: Secondary | ICD-10-CM | POA: Diagnosis not present

## 2017-07-08 MED ORDER — METOLAZONE 2.5 MG PO TABS: 2.5000 mg | ORAL_TABLET | Freq: Every day | ORAL | 3 refills | Status: DC | PRN

## 2017-07-08 MED ORDER — FUROSEMIDE 40 MG PO TABS: 80.0000 mg | ORAL_TABLET | ORAL | 11 refills | Status: DC

## 2017-07-08 MED ORDER — FUROSEMIDE 40 MG PO TABS: ORAL_TABLET | ORAL | 3 refills | Status: DC

## 2017-07-08 NOTE — Patient Instructions (Addendum)
MEDICATION INSTRUCTIONS  change FUROSEMIDE 80 MG tablet by mouth --take Monday and Friday mornings only ,  then take FUROSEMIDE 40 MG by mouth   -- Sunday, Tuesday , Wednesday, Thursday, Saturday.    ---- only take  Metolazone 2.5 mg -if weight gain more than 3 lbs overnight or 5 lbs in  one weeks time.   continue taking potassium as directed    Labs today BMP   DIETARY INSTRUCTIONS Limit liquid intake Drink only 3 bottles of 16 oz of water a day  2,000 mg sodium /salt intake daily or 500 mg of salt/sodium with each meal     Your physician recommends that you schedule a follow-up appointment in 2 months with RHONDA or DR HARDING.  If you need a refill on your cardiac medications before your next appointment, please call your pharmacy.

## 2017-07-08 NOTE — Progress Notes (Signed)
Cardiology Office Note   Date:  07/08/2017   ID:  MAKHYA ARAVE, DOB [birth year], MRN [id number]  PCP:  [name], MD  Cardiologist: Dr. Ellyn Hack, 04/01/2017 Rosaria Ferries, PA-C 01/29/2017  Chief Complaint  Patient presents with  . Follow-up    History of Present Illness: [name] is a 72 y.o. female with a history of  nonocclusive CAD by cath in 2012, DM II, HTN, morbid obesity with obesity hypoventilation syndrome and OSA not on CPAP, D-CHF, near syncope with palpitations and PACs on monitor,low risk Myoview 01/2016, dry wt 270-273 lbs, vein incompetence contributing to LE edema, CVA, sz, chronic pancreatitis.  10/08 office visit, after ER visit for dehydration and Lasix decreased to 40 mg qd. Lasix increased to 40 mg bid or take metolazone 2.5 mg qd to get to dry wt, can go to Lasix 80 mg bid prn for additional wt gain>call office  Admitted 12/22-12/25 for ?CVA, neuro felt pt had sz during sleep, Lamotrigine increased to 100 mg bid, BRBPR>>diverticulosis and ischemic colitis w/ ulcers>>on ABX, had polypectomy as well, Dieuresed w/ IV Lasix, home dose possibly increased to alternating 8m qd w/ 80 mg qd, metolazone once a week. 12/30 ER visit for hyperglycemia w/ CBG >500, hypotension>>IVF  [name] JOHNDROWpresents for cardiology follow up.  She is taking Lasix 40 mg daily 4 days a week and 80 mg daily 3 days a week. She is taking the metolazone on Saturday only.   She is compliant w/ meds, her sister is helping with watching the sodium and her medications. Her weight has been trending down, she was 280 on Jan 1 and has been below 275 since 01/11.   She feels her breathing is doing ok, she is walking from the bedroom to the living room. She is sleeping propped up, does not yet have home O2 or CPAP. They are working on home O2. Sometimes she wakes w/ LE edema, frequently gets it during the day.  Her blood sugars have been up and down, from 83 to over 500. She has not  seen MD for this.  She has chronic ankle/foot pain, she wears braces bilaterally. They help the pain.    Past Medical History:  Diagnosis Date  . Abnormal liver function     in the past.  . Anemia   . Arthritis    all over  . Bruises easily   . Cataract    right eye;immature  . CHF (congestive heart failure) ([place]   . Chronic back pain    stenosis  . Chronic cough   . Demand myocardial infarction ([facility] 2012   Demand Infarction in setting of Pancreatitis --> mild Troponin elevation, Non-obstructive CAD  . Depression    takes Abilify daily as well as Zoloft  . Diastolic heart failure 22229  Grade 1 diastolic Dysfunction by Echo   . Diverticulosis   . DM (diabetes mellitus) ([place]    takes Victoza daily as well as Lantus and Humalog  . Empty sella ([place]    on MRI in 2009.  . Glaucoma   . Headache(784.0)    last [id number]  . History of blood transfusion    no abnormal reaction noted  . History of colon polyps    benign  . History of influenza    antibiotic given yesterday along with taking Delsym cough syrup  . HTN (hypertension)    takes [employer]  . Hyperlipidemia    takes Lipitor daily  .  Joint swelling   . Nocturia   . Obstructive sleep apnea   . Pancreatitis    takes Pancrelipase daily  . Parkinson's disease ([place])    takes Sinemet daily  . Peripheral edema    takes Lasix daily  . Peripheral neuropathy   . Pneumonia 2012  . Seizure disorder ([place])   . Seizures ([place])    takes Lamictal daily and Primidone nightly;last seizure 2wks ago  . Urinary frequency   . Urinary urgency   . Varicose veins of both lower extremities with pain     Past Surgical History:  Procedure Laterality Date  . ABDOMINAL HYSTERECTOMY    . APPENDECTOMY    . BACK SURGERY    . CARDIAC CATHETERIZATION  2009/March 2012   2009: (Dr. Alvie Heidelberg) Nonobstructive CAD; 2012: Minimal CAD --> false positive stress test  . Cardiac Event Monitor  September-October  2017   Sinus rhythm with occasional PACs and artifact. No arrhythmias besides one short run of tachycardia.  . CHOLECYSTECTOMY    . COLONOSCOPY N/A 08/15/2012   Procedure: COLONOSCOPY;  Surgeon: Arta Silence, MD;  Location: [facility];  Service: Endoscopy;  Laterality: N/A;  . COLONOSCOPY WITH PROPOFOL Left 06/17/2017   Procedure: COLONOSCOPY WITH PROPOFOL;  Surgeon: [name], MD;  Location: [place];  Service: Gastroenterology;  Laterality: Left;  . ESOPHAGOGASTRODUODENOSCOPY    . eye cysts Bilateral   . LASIK    . NM MYOVIEW LTD  March 2012; Aug 2017   Probably normal Lexiscan Myoview. Mild reversible Anterior Defect ~ Breast Attenuation. (CRO ischemia):  FALSE POSITIVE by Cath; b) 8/'17: LOW RISK, No ischemia or Infarction.  Breast Attenuation not noted.  Marland Kitchen NM MYOVIEW LTD  01/2016   LOW RISK. No ischemia or infarction.  Marland Kitchen RECTAL POLYPECTOMY    . TONSILLECTOMY    . TRANSTHORACIC ECHOCARDIOGRAM  01/2016   Normal LV chamber size with moderate LVH pattern. EF 50-55%. Severe LA dilation. Moderate RA dilation. PA pressure elevated at 38 mmHg (mild as (  . TUBAL LIGATION    . vaginal cyst removed     several times    Current Outpatient Medications  Medication Sig Dispense Refill  . acetaminophen (TYLENOL) 500 MG tablet Take 500-1,000 mg by mouth every 4 (four) hours as needed (for pain).     Marland Kitchen atorvastatin (LIPITOR) 10 MG tablet Take 10 mg by mouth daily.    . B Complex-C (B-COMPLEX WITH VITAMIN C) tablet Take 1 tablet by mouth daily.    . Blood Glucose Monitoring Suppl (ACCU-CHEK NANO SMARTVIEW) W/DEVICE KIT Use to check blood sugar 3 times per day dx code E11.65 1 kit 0  . carbidopa-levodopa (SINEMET IR) 25-100 MG per tablet Take 1 tablet by mouth 3 (three) times daily.     . carvedilol (COREG) 25 MG tablet Take 1 tablet (25 mg total) by mouth 2 (two) times daily. 60 tablet 5  . Cholecalciferol (VITAMIN D-3) 1000 units CAPS Take 1,000 Units by mouth daily.    . cholestyramine  (QUESTRAN) 4 G packet Take 4 g by mouth 2 (two) times daily as needed (for diarrhea).     . furosemide (LASIX) 40 MG tablet Take 1 tablet by mouth every Tuesday, Thursday, Saturday, and Sunday.    . furosemide (LASIX) 40 MG tablet Take 2 tablets by mouth every Monday, Wednesday, and Friday.    . insulin aspart (NOVOLOG FLEXPEN) 100 UNIT/ML FlexPen Inject4- 6 units before breakfast and lunch and 10-12 units before dinner (Patient taking differently:  Inject 6-10 Units into the skin See admin instructions. 6 units before breakfast then 6 units before lunch then 10 units before dinner (evening meal)) 30 mL 3  . Insulin Glargine (TOUJEO SOLOSTAR) 300 UNIT/ML SOPN Inject 40 Units into the skin daily. (Patient taking differently: Inject 36 Units into the skin at bedtime. ) 18 mL 1  . Insulin Pen Needle 31G X 8 MM MISC Use to inject medication 4 times daily 400 each 1  . isosorbide mononitrate (IMDUR) 30 MG 24 hr tablet Take 1 tablet (30 mg total) by mouth daily. 30 tablet 5  . JARDIANCE 10 MG TABS tablet TAKE ONE TABLET BY MOUTH ONCE DAILY WITH  BREAKFAST (Patient taking differently: Take 10 mg by mouth once a day with breakfast) 30 tablet 3  . lamoTRIgine (LAMICTAL) 100 MG tablet Take 1 tablet (100 mg total) by mouth 2 (two) times daily. 60 tablet 0  . Menthol, Topical Analgesic, 3.5 % GEL Apply 1 application topically as needed (for neuropathy in feet).     . Multiple Vitamins-Minerals (MULTIVITAMIN WITH MINERALS) tablet Take 1 tablet by mouth daily.    Marland Kitchen olmesartan (BENICAR) 40 MG tablet Take 40 mg by mouth daily.    . Pancrelipase, Lip-Prot-Amyl, 25000 UNITS CPEP Take 25,000-50,000 Units by mouth See admin instructions. 50,000 units with breakfast then 25,000 units with lunch then 50,000 units with dinner (evening meal)    . potassium chloride SA (K-DUR,KLOR-CON) 20 MEQ tablet Take 2 tablets (40 mEq total) by mouth daily. On the day you take metolazone, take an extra 20 meq (60 meq total) 35 tablet 5  .  pregabalin (LYRICA) 100 MG capsule Take 100 mg by mouth 2 (two) times daily.    . sertraline (ZOLOFT) 50 MG tablet Take 50 mg by mouth every evening.     Marland Kitchen VICTOZA 18 MG/3ML SOPN INJECT 1.8MG SUBCUTANEOUSLY DAILY (NEEDS APPOINTMENT FOR REFILLS) (Patient taking differently: Inject 1.8 mg into the skin daily at lunchtime) 27 mL 0  . metolazone (ZAROXOLYN) 2.5 MG tablet Take 1 tablet (2.5 mg total) by mouth once a week. (Patient taking differently: Take 2.5 mg by mouth every Saturday. ) 4 tablet 3   No current facility-administered medications for this visit.     Allergies:   Penicillins; Sulfa antibiotics; Codeine; and Other    Social History:  The patient  reports that she has quit smoking. Her smoking use included cigarettes. She has a 12.50 pack-year smoking history. she has never used smokeless tobacco. She reports that she does not drink alcohol or use drugs.   Family History:  The patient's family history includes Allergies in her father; Cancer in her other; Diabetes in her brother, mother, sister, and son; Heart disease in her father, mother, and sister; Heart failure in her father; Hyperlipidemia in her father, mother, and sister; Hypertension in her brother, father, mother, sister, sister, and son.    ROS:  Please see the history of present illness. All other systems are reviewed and negative.    PHYSICAL EXAM: VS:  BP 132/74   Pulse 63   Ht _0  (1.651 m)   Wt 272 lb (123.4 kg)   BMI 45.26 kg/m  , BMI Body mass index is 45.26 kg/m. GEN: Well nourished, well developed, female in no acute distress  HEENT: normal for age  Neck: no JVD, no carotid bruit, no masses Cardiac: RRR; soft murmur, no rubs, or gallops Respiratory:  clear to auscultation bilaterally, normal work of breathing GI: soft, nontender,  nondistended, + BS MS: no deformity or atrophy; no edema; distal pulses are 2+ in all 4 extremities   Skin: warm and dry, no rash Neuro:  Strength and sensation are  intact Psych: euthymic mood, full affect   EKG:  EKG is ordered today.   Recent Labs: 06/15/2017: ALT 21 06/23/2017: B Natriuretic Peptide 69.9; BUN 34; Creatinine, Ser 1.80; Hemoglobin 14.6; Platelets 214; Potassium 4.1; Sodium 136    Lipid Panel    Component Value Date/Time   CHOL 135 09/30/2015 1047   TRIG 71.0 09/30/2015 1047   HDL 62.50 09/30/2015 1047   CHOLHDL 2 09/30/2015 1047   VLDL 14.2 09/30/2015 1047   LDLCALC 59 09/30/2015 1047   LDLDIRECT 66.0 01/27/2015 1104     Wt Readings from Last 3 Encounters:  07/08/17 272 lb (123.4 kg)  06/23/17 264 lb (119.7 kg)  06/18/17 286 lb 2.5 oz (129.8 kg)     Other studies Reviewed: Additional studies/ records that were reviewed today include: Office notes, hospital records and testing.  ASSESSMENT AND PLAN:  1.  Chronic diastolic CHF: According to the book that she is keeping, her weight has trended steadily down.  Her weight was 264 pounds on 12/30, but she was dehydrated and hypotensive at that time and required IV fluids.  She is having occasional episodes of being lightheaded when she stands up.    It is likely that she is at her dry weight now.  I wish to decrease the diuretics to where she can maintain this, so I will change the Lasix 80 mg to Monday and Friday only, take 40 mg for 5 days a week.  Use the metolazone only as needed.  No change to potassium dosing.  She is to continue compliance with daily weights, 2000 mg sodium diet  2.  Hypertension: Target SBP is 130 and she is minimally above that.  No changes right now, continue to follow.  3.  Sleep apnea and obesity hypoventilation: She is working on getting oxygen or sleep apnea machine.  She is to follow-up with the people that have contacted her about this to see if she can get this.   Current medicines are reviewed at length with the patient today.  The patient does not have concerns regarding medicines.  The following changes have been made: Decrease Lasix  slightly, metolazone as needed only  Labs/ tests ordered today include:   Orders Placed This Encounter  Procedures  . Basic metabolic panel     Disposition:   FU with Dr. Ellyn Hack 2 mos  Signed, Rosaria Ferries, PA-C  07/08/2017 4:44 PM    [facility] Phone: [phone]; Fax: [phone]  This note was written with the assistance of speech recognition software. Please excuse any transcriptional errors.

## 2017-07-08 NOTE — Telephone Encounter (Signed)
Patient in for office visit today and needed labs. She had not had much to drink prior to visit and was very difficult stick. Rickard Patience was unable obtain labs, patient will come back tomorrow to get labs drawn.

## 2017-07-09 ENCOUNTER — Telehealth: Payer: Self-pay | Admitting: Physician Assistant

## 2017-07-09 DIAGNOSIS — E1142 Type 2 diabetes mellitus with diabetic polyneuropathy: Secondary | ICD-10-CM | POA: Diagnosis not present

## 2017-07-09 DIAGNOSIS — G20 Parkinson's disease: Secondary | ICD-10-CM | POA: Diagnosis not present

## 2017-07-09 DIAGNOSIS — K5790 Diverticulosis of intestine, part unspecified, without perforation or abscess without bleeding: Secondary | ICD-10-CM | POA: Diagnosis not present

## 2017-07-09 DIAGNOSIS — I2510 Atherosclerotic heart disease of native coronary artery without angina pectoris: Secondary | ICD-10-CM | POA: Diagnosis not present

## 2017-07-09 DIAGNOSIS — M1990 Unspecified osteoarthritis, unspecified site: Secondary | ICD-10-CM | POA: Diagnosis not present

## 2017-07-09 DIAGNOSIS — D649 Anemia, unspecified: Secondary | ICD-10-CM | POA: Diagnosis not present

## 2017-07-09 DIAGNOSIS — I5032 Chronic diastolic (congestive) heart failure: Secondary | ICD-10-CM | POA: Diagnosis not present

## 2017-07-09 DIAGNOSIS — G40909 Epilepsy, unspecified, not intractable, without status epilepticus: Secondary | ICD-10-CM | POA: Diagnosis not present

## 2017-07-09 DIAGNOSIS — I110 Hypertensive heart disease with heart failure: Secondary | ICD-10-CM | POA: Diagnosis not present

## 2017-07-09 NOTE — Telephone Encounter (Signed)
New message  Pt verbalized that she is calling for RN  To go over her discharge and medication instructions   From the hospital

## 2017-07-09 NOTE — Telephone Encounter (Signed)
Patient states she forgot to ask yesterday a question .  When she was discharge for the hospital in dec 2018. ASA 81 MG was discontinue off her medication list. she wanted to know if she should restart asa 81 mg.  RN informed patient not to restart medication until further notice from  Provider.   Will  Defer to provder?   patient Verbalized understanding.

## 2017-07-10 ENCOUNTER — Encounter: Payer: Self-pay | Admitting: Neurology

## 2017-07-10 DIAGNOSIS — I5032 Chronic diastolic (congestive) heart failure: Secondary | ICD-10-CM | POA: Diagnosis not present

## 2017-07-10 DIAGNOSIS — I2510 Atherosclerotic heart disease of native coronary artery without angina pectoris: Secondary | ICD-10-CM | POA: Diagnosis not present

## 2017-07-10 DIAGNOSIS — G20 Parkinson's disease: Secondary | ICD-10-CM | POA: Diagnosis not present

## 2017-07-10 DIAGNOSIS — K5790 Diverticulosis of intestine, part unspecified, without perforation or abscess without bleeding: Secondary | ICD-10-CM | POA: Diagnosis not present

## 2017-07-10 DIAGNOSIS — E1142 Type 2 diabetes mellitus with diabetic polyneuropathy: Secondary | ICD-10-CM | POA: Diagnosis not present

## 2017-07-10 DIAGNOSIS — D649 Anemia, unspecified: Secondary | ICD-10-CM | POA: Diagnosis not present

## 2017-07-10 DIAGNOSIS — M1990 Unspecified osteoarthritis, unspecified site: Secondary | ICD-10-CM | POA: Diagnosis not present

## 2017-07-10 DIAGNOSIS — I110 Hypertensive heart disease with heart failure: Secondary | ICD-10-CM | POA: Diagnosis not present

## 2017-07-10 DIAGNOSIS — G40909 Epilepsy, unspecified, not intractable, without status epilepticus: Secondary | ICD-10-CM | POA: Diagnosis not present

## 2017-07-10 NOTE — Progress Notes (Signed)
Reasonable plan.  [name] Hew, MD

## 2017-07-11 DIAGNOSIS — E1142 Type 2 diabetes mellitus with diabetic polyneuropathy: Secondary | ICD-10-CM | POA: Diagnosis not present

## 2017-07-11 DIAGNOSIS — K5790 Diverticulosis of intestine, part unspecified, without perforation or abscess without bleeding: Secondary | ICD-10-CM | POA: Diagnosis not present

## 2017-07-11 DIAGNOSIS — I5032 Chronic diastolic (congestive) heart failure: Secondary | ICD-10-CM | POA: Diagnosis not present

## 2017-07-11 DIAGNOSIS — G40909 Epilepsy, unspecified, not intractable, without status epilepticus: Secondary | ICD-10-CM | POA: Diagnosis not present

## 2017-07-11 DIAGNOSIS — M1990 Unspecified osteoarthritis, unspecified site: Secondary | ICD-10-CM | POA: Diagnosis not present

## 2017-07-11 DIAGNOSIS — G20 Parkinson's disease: Secondary | ICD-10-CM | POA: Diagnosis not present

## 2017-07-11 DIAGNOSIS — D649 Anemia, unspecified: Secondary | ICD-10-CM | POA: Diagnosis not present

## 2017-07-11 DIAGNOSIS — I110 Hypertensive heart disease with heart failure: Secondary | ICD-10-CM | POA: Diagnosis not present

## 2017-07-11 DIAGNOSIS — I2510 Atherosclerotic heart disease of native coronary artery without angina pectoris: Secondary | ICD-10-CM | POA: Diagnosis not present

## 2017-07-11 DIAGNOSIS — K921 Melena: Secondary | ICD-10-CM | POA: Diagnosis not present

## 2017-07-11 NOTE — Telephone Encounter (Signed)
Left message to call back  

## 2017-07-11 NOTE — Telephone Encounter (Signed)
She had rectal bleeding in December, stay off the Aspirin. Thanks

## 2017-07-12 DIAGNOSIS — K5790 Diverticulosis of intestine, part unspecified, without perforation or abscess without bleeding: Secondary | ICD-10-CM | POA: Diagnosis not present

## 2017-07-12 DIAGNOSIS — I5032 Chronic diastolic (congestive) heart failure: Secondary | ICD-10-CM | POA: Diagnosis not present

## 2017-07-12 DIAGNOSIS — R0902 Hypoxemia: Secondary | ICD-10-CM | POA: Diagnosis not present

## 2017-07-12 DIAGNOSIS — D649 Anemia, unspecified: Secondary | ICD-10-CM | POA: Diagnosis not present

## 2017-07-12 DIAGNOSIS — I2510 Atherosclerotic heart disease of native coronary artery without angina pectoris: Secondary | ICD-10-CM | POA: Diagnosis not present

## 2017-07-12 DIAGNOSIS — M1990 Unspecified osteoarthritis, unspecified site: Secondary | ICD-10-CM | POA: Diagnosis not present

## 2017-07-12 DIAGNOSIS — I110 Hypertensive heart disease with heart failure: Secondary | ICD-10-CM | POA: Diagnosis not present

## 2017-07-12 DIAGNOSIS — E1142 Type 2 diabetes mellitus with diabetic polyneuropathy: Secondary | ICD-10-CM | POA: Diagnosis not present

## 2017-07-12 DIAGNOSIS — G20 Parkinson's disease: Secondary | ICD-10-CM | POA: Diagnosis not present

## 2017-07-12 DIAGNOSIS — J449 Chronic obstructive pulmonary disease, unspecified: Secondary | ICD-10-CM | POA: Diagnosis not present

## 2017-07-12 DIAGNOSIS — G40909 Epilepsy, unspecified, not intractable, without status epilepticus: Secondary | ICD-10-CM | POA: Diagnosis not present

## 2017-07-12 NOTE — Telephone Encounter (Signed)
Spoke to patient  Do not restart  aspirin 81 mg per rhonda due to rectal bleeding  05/2017- hospitalization  Patient voiced understanding.

## 2017-07-12 NOTE — Telephone Encounter (Signed)
Spoke with patient and asked if she could come in the office one day next week and have her labs drawn since they weren't able to be drawn on her visit Monday due to dehydration. Patient voiced understanding and said we will see her next week.

## 2017-07-15 DIAGNOSIS — E1142 Type 2 diabetes mellitus with diabetic polyneuropathy: Secondary | ICD-10-CM | POA: Diagnosis not present

## 2017-07-15 DIAGNOSIS — I110 Hypertensive heart disease with heart failure: Secondary | ICD-10-CM | POA: Diagnosis not present

## 2017-07-15 DIAGNOSIS — G40909 Epilepsy, unspecified, not intractable, without status epilepticus: Secondary | ICD-10-CM | POA: Diagnosis not present

## 2017-07-15 DIAGNOSIS — M1990 Unspecified osteoarthritis, unspecified site: Secondary | ICD-10-CM | POA: Diagnosis not present

## 2017-07-15 DIAGNOSIS — D649 Anemia, unspecified: Secondary | ICD-10-CM | POA: Diagnosis not present

## 2017-07-15 DIAGNOSIS — K5790 Diverticulosis of intestine, part unspecified, without perforation or abscess without bleeding: Secondary | ICD-10-CM | POA: Diagnosis not present

## 2017-07-15 DIAGNOSIS — I5032 Chronic diastolic (congestive) heart failure: Secondary | ICD-10-CM | POA: Diagnosis not present

## 2017-07-15 DIAGNOSIS — G20 Parkinson's disease: Secondary | ICD-10-CM | POA: Diagnosis not present

## 2017-07-15 DIAGNOSIS — I2510 Atherosclerotic heart disease of native coronary artery without angina pectoris: Secondary | ICD-10-CM | POA: Diagnosis not present

## 2017-07-16 DIAGNOSIS — I2510 Atherosclerotic heart disease of native coronary artery without angina pectoris: Secondary | ICD-10-CM | POA: Diagnosis not present

## 2017-07-16 DIAGNOSIS — K5790 Diverticulosis of intestine, part unspecified, without perforation or abscess without bleeding: Secondary | ICD-10-CM | POA: Diagnosis not present

## 2017-07-16 DIAGNOSIS — M1990 Unspecified osteoarthritis, unspecified site: Secondary | ICD-10-CM | POA: Diagnosis not present

## 2017-07-16 DIAGNOSIS — G20 Parkinson's disease: Secondary | ICD-10-CM | POA: Diagnosis not present

## 2017-07-16 DIAGNOSIS — I5032 Chronic diastolic (congestive) heart failure: Secondary | ICD-10-CM | POA: Diagnosis not present

## 2017-07-16 DIAGNOSIS — D649 Anemia, unspecified: Secondary | ICD-10-CM | POA: Diagnosis not present

## 2017-07-16 DIAGNOSIS — G40909 Epilepsy, unspecified, not intractable, without status epilepticus: Secondary | ICD-10-CM | POA: Diagnosis not present

## 2017-07-16 DIAGNOSIS — E1142 Type 2 diabetes mellitus with diabetic polyneuropathy: Secondary | ICD-10-CM | POA: Diagnosis not present

## 2017-07-16 DIAGNOSIS — I110 Hypertensive heart disease with heart failure: Secondary | ICD-10-CM | POA: Diagnosis not present

## 2017-07-17 DIAGNOSIS — I5032 Chronic diastolic (congestive) heart failure: Secondary | ICD-10-CM | POA: Diagnosis not present

## 2017-07-17 DIAGNOSIS — I2510 Atherosclerotic heart disease of native coronary artery without angina pectoris: Secondary | ICD-10-CM | POA: Diagnosis not present

## 2017-07-17 DIAGNOSIS — I110 Hypertensive heart disease with heart failure: Secondary | ICD-10-CM | POA: Diagnosis not present

## 2017-07-17 DIAGNOSIS — E1142 Type 2 diabetes mellitus with diabetic polyneuropathy: Secondary | ICD-10-CM | POA: Diagnosis not present

## 2017-07-17 DIAGNOSIS — G20 Parkinson's disease: Secondary | ICD-10-CM | POA: Diagnosis not present

## 2017-07-17 DIAGNOSIS — D649 Anemia, unspecified: Secondary | ICD-10-CM | POA: Diagnosis not present

## 2017-07-17 DIAGNOSIS — G40909 Epilepsy, unspecified, not intractable, without status epilepticus: Secondary | ICD-10-CM | POA: Diagnosis not present

## 2017-07-17 DIAGNOSIS — K5790 Diverticulosis of intestine, part unspecified, without perforation or abscess without bleeding: Secondary | ICD-10-CM | POA: Diagnosis not present

## 2017-07-17 DIAGNOSIS — M1990 Unspecified osteoarthritis, unspecified site: Secondary | ICD-10-CM | POA: Diagnosis not present

## 2017-07-18 DIAGNOSIS — M1990 Unspecified osteoarthritis, unspecified site: Secondary | ICD-10-CM | POA: Diagnosis not present

## 2017-07-18 DIAGNOSIS — K5790 Diverticulosis of intestine, part unspecified, without perforation or abscess without bleeding: Secondary | ICD-10-CM | POA: Diagnosis not present

## 2017-07-18 DIAGNOSIS — D649 Anemia, unspecified: Secondary | ICD-10-CM | POA: Diagnosis not present

## 2017-07-18 DIAGNOSIS — I5032 Chronic diastolic (congestive) heart failure: Secondary | ICD-10-CM | POA: Diagnosis not present

## 2017-07-18 DIAGNOSIS — I2510 Atherosclerotic heart disease of native coronary artery without angina pectoris: Secondary | ICD-10-CM | POA: Diagnosis not present

## 2017-07-18 DIAGNOSIS — G20 Parkinson's disease: Secondary | ICD-10-CM | POA: Diagnosis not present

## 2017-07-18 DIAGNOSIS — E1142 Type 2 diabetes mellitus with diabetic polyneuropathy: Secondary | ICD-10-CM | POA: Diagnosis not present

## 2017-07-18 DIAGNOSIS — G40909 Epilepsy, unspecified, not intractable, without status epilepticus: Secondary | ICD-10-CM | POA: Diagnosis not present

## 2017-07-18 DIAGNOSIS — I110 Hypertensive heart disease with heart failure: Secondary | ICD-10-CM | POA: Diagnosis not present

## 2017-07-19 DIAGNOSIS — R609 Edema, unspecified: Secondary | ICD-10-CM | POA: Diagnosis not present

## 2017-07-19 DIAGNOSIS — I5031 Acute diastolic (congestive) heart failure: Secondary | ICD-10-CM | POA: Diagnosis not present

## 2017-07-20 LAB — BASIC METABOLIC PANEL
BUN/Creatinine Ratio: 17 (ref 12–28)
BUN: 19 mg/dL (ref 8–27)
CO2: 26 mmol/L (ref 20–29)
Calcium: 9.4 mg/dL (ref 8.7–10.3)
Chloride: 97 mmol/L (ref 96–106)
Creatinine, Ser: 1.13 mg/dL — ABNORMAL HIGH (ref 0.57–1.00)
GFR calc Af Amer: 57 mL/min/{1.73_m2} — ABNORMAL LOW (ref >59)
GFR calc non Af Amer: 50 mL/min/{1.73_m2} — ABNORMAL LOW (ref >59)
Glucose: 87 mg/dL (ref 65–99)
Potassium: 4.5 mmol/L (ref 3.5–5.2)
Sodium: 141 mmol/L (ref 134–144)

## 2017-07-23 ENCOUNTER — Ambulatory Visit: Payer: Medicare HMO | Admitting: Neurology

## 2017-07-24 DIAGNOSIS — G40909 Epilepsy, unspecified, not intractable, without status epilepticus: Secondary | ICD-10-CM | POA: Diagnosis not present

## 2017-07-24 DIAGNOSIS — K5790 Diverticulosis of intestine, part unspecified, without perforation or abscess without bleeding: Secondary | ICD-10-CM | POA: Diagnosis not present

## 2017-07-24 DIAGNOSIS — D649 Anemia, unspecified: Secondary | ICD-10-CM | POA: Diagnosis not present

## 2017-07-24 DIAGNOSIS — G20 Parkinson's disease: Secondary | ICD-10-CM | POA: Diagnosis not present

## 2017-07-24 DIAGNOSIS — E1142 Type 2 diabetes mellitus with diabetic polyneuropathy: Secondary | ICD-10-CM | POA: Diagnosis not present

## 2017-07-24 DIAGNOSIS — I5032 Chronic diastolic (congestive) heart failure: Secondary | ICD-10-CM | POA: Diagnosis not present

## 2017-07-24 DIAGNOSIS — I110 Hypertensive heart disease with heart failure: Secondary | ICD-10-CM | POA: Diagnosis not present

## 2017-07-24 DIAGNOSIS — M1990 Unspecified osteoarthritis, unspecified site: Secondary | ICD-10-CM | POA: Diagnosis not present

## 2017-07-24 DIAGNOSIS — I2510 Atherosclerotic heart disease of native coronary artery without angina pectoris: Secondary | ICD-10-CM | POA: Diagnosis not present

## 2017-07-25 DIAGNOSIS — K5790 Diverticulosis of intestine, part unspecified, without perforation or abscess without bleeding: Secondary | ICD-10-CM | POA: Diagnosis not present

## 2017-07-25 DIAGNOSIS — G20 Parkinson's disease: Secondary | ICD-10-CM | POA: Diagnosis not present

## 2017-07-25 DIAGNOSIS — I2510 Atherosclerotic heart disease of native coronary artery without angina pectoris: Secondary | ICD-10-CM | POA: Diagnosis not present

## 2017-07-25 DIAGNOSIS — E1142 Type 2 diabetes mellitus with diabetic polyneuropathy: Secondary | ICD-10-CM | POA: Diagnosis not present

## 2017-07-25 DIAGNOSIS — M1990 Unspecified osteoarthritis, unspecified site: Secondary | ICD-10-CM | POA: Diagnosis not present

## 2017-07-25 DIAGNOSIS — D649 Anemia, unspecified: Secondary | ICD-10-CM | POA: Diagnosis not present

## 2017-07-25 DIAGNOSIS — G40909 Epilepsy, unspecified, not intractable, without status epilepticus: Secondary | ICD-10-CM | POA: Diagnosis not present

## 2017-07-25 DIAGNOSIS — I110 Hypertensive heart disease with heart failure: Secondary | ICD-10-CM | POA: Diagnosis not present

## 2017-07-25 DIAGNOSIS — Z1231 Encounter for screening mammogram for malignant neoplasm of breast: Secondary | ICD-10-CM | POA: Diagnosis not present

## 2017-07-25 DIAGNOSIS — I5032 Chronic diastolic (congestive) heart failure: Secondary | ICD-10-CM | POA: Diagnosis not present

## 2017-07-26 NOTE — Progress Notes (Signed)
[name] was seen today in the movement disorders clinic for neurologic consultation at the request of [name], MD.  The consultation is for the evaluation of PD.   Pt is accompanied by her sister who supplements the history.   Patient has most recently been under the care of Dr. Hall Busing.  She last saw her about 1 year ago.  I have reviewed records made available to me.  Dr. Hall Busing discussed with her drug-induced parkinsonism due to Abilify versus Parkinson's disease.  A DaT scan was offered but declined.  Patient's symptoms were noted to be stable for 18 months.  Patient noted that mood was good on Abilify.  Pt was started on carbidopa/levodopa 25/100 in 2014 and has been on it ever since.  Has been on primidone for this as well but was d/c last march.  Pt does state that she stopped abilify 4 months ago because she heard it caused weight gain.   Of note, patient has a hx of both epileptic and nonepileptic seizure per records.  pts sister states that last event was before xmas and cannot say whether it was epileptic or nonepileptic event.  Sister states was staring and "trembling" for 5 min.  She will be sluggish for 30 min and then back to herself.  She doesn't drive   Specific Symptoms:  Tremor: Yes.   , R hand with trying to write Family hx of similar:  No. Voice: no change in voice per sister Sleep: trouble getting asleep  Vivid Dreams:  occasionally  Acting out dreams:  Yes.  , screams per sister occasionally Wet Pillows: No. Postural symptoms:  Yes.    Falls?  No. Bradykinesia symptoms: slow movements and difficulty getting out of a chair Loss of smell:  No. Loss of taste:  No. Urinary Incontinence:  No. - better than in past when had nighttime urinary incontinence Difficulty Swallowing:  No. Handwriting, micrographia: No. - larger per patients sister Trouble with ADL's:  No. (does have some trouble putting on shoes due to SOB)  Trouble buttoning clothing: No. Depression:   No. - sister states that is depressed but patient denies Memory changes:  No. Hallucinations:  No.  visual distortions: Yes.   N/V:  No. Lightheaded:  Yes.    Syncope: No. Diplopia:  No. Dyskinesia:  No.   She had CT in 05/2017 and it showed old infarct in the R parietal lobe.    MRI brain also completed in 05/2017.  I reviewed this.  There was an old right pre-central gyrus infarct.  There was also mild small vessel disease  PREVIOUS MEDICATIONS: Sinemet  ALLERGIES:   Allergies  Allergen Reactions  . Penicillins Hives    Has patient had a PCN reaction causing immediate rash, facial/tongue/throat swelling, SOB or lightheadedness with hypotension: Yes Has patient had a PCN reaction causing severe rash involving mucus membranes or skin necrosis: No Has patient had a PCN reaction that required hospitalization: No Has patient had a PCN reaction occurring within the last 10 years: No If all of the above answers are "NO", then may proceed with Cephalosporin use.   . Sulfa Antibiotics Hives  . Aspirin Other (See Comments)     On aspirin 81 mg - Rectal bleeding in dec 2018  . Codeine     Headache and makes the patient feel "off"  . Other Rash    [place]    CURRENT MEDICATIONS:  Outpatient Encounter Medications as of 07/30/2017  Medication Sig  .  acetaminophen (TYLENOL) 500 MG tablet Take 500-1,000 mg by mouth every 4 (four) hours as needed (for pain).   Marland Kitchen atorvastatin (LIPITOR) 10 MG tablet Take 10 mg by mouth daily.  . B Complex-C (B-COMPLEX WITH VITAMIN C) tablet Take 1 tablet by mouth daily.  . Blood Glucose Monitoring Suppl (ACCU-CHEK NANO SMARTVIEW) W/DEVICE KIT Use to check blood sugar 3 times per day dx code E11.65  . carbidopa-levodopa (SINEMET IR) 25-100 MG per tablet Take 1 tablet by mouth 3 (three) times daily.   . carvedilol (COREG) 25 MG tablet Take 1 tablet (25 mg total) by mouth 2 (two) times daily.  . Cholecalciferol (VITAMIN D-3) 1000 units CAPS Take 1,000 Units by  mouth daily.  . cholestyramine (QUESTRAN) 4 G packet Take 4 g by mouth 2 (two) times daily as needed (for diarrhea).   . furosemide (LASIX) 40 MG tablet Take 40 mg ( 1 tablet) by mouth on SUN,TUES,WED., THUR.,SAT. ONLY  . insulin aspart (NOVOLOG FLEXPEN) 100 UNIT/ML FlexPen Inject4- 6 units before breakfast and lunch and 10-12 units before dinner (Patient taking differently: Inject 6-10 Units into the skin See admin instructions. 6 units before breakfast then 6 units before lunch then 10 units before dinner (evening meal))  . Insulin Glargine (TOUJEO SOLOSTAR) 300 UNIT/ML SOPN Inject 40 Units into the skin daily. (Patient taking differently: Inject 36 Units into the skin at bedtime. )  . Insulin Pen Needle 31G X 8 MM MISC Use to inject medication 4 times daily  . isosorbide mononitrate (IMDUR) 30 MG 24 hr tablet Take 1 tablet (30 mg total) by mouth daily.  Marland Kitchen lamoTRIgine (LAMICTAL) 100 MG tablet Take 1 tablet (100 mg total) by mouth 2 (two) times daily.  . Menthol, Topical Analgesic, 3.5 % GEL Apply 1 application topically as needed (for neuropathy in feet).   . metolazone (ZAROXOLYN) 2.5 MG tablet Take 1 tablet (2.5 mg total) by mouth daily as needed. If weight is  Increase 3 lbs overnight or 5 lbs. in one week ,take 30 minutes before furosemide.  . Multiple Vitamins-Minerals (MULTIVITAMIN WITH MINERALS) tablet Take 1 tablet by mouth daily.  Marland Kitchen olmesartan (BENICAR) 40 MG tablet Take 40 mg by mouth daily.  . Pancrelipase, Lip-Prot-Amyl, 25000 UNITS CPEP Take 25,000-50,000 Units by mouth See admin instructions. 50,000 units with breakfast then 25,000 units with lunch then 50,000 units with dinner (evening meal)  . potassium chloride SA (K-DUR,KLOR-CON) 20 MEQ tablet Take 2 tablets (40 mEq total) by mouth daily. On the day you take metolazone, take an extra 20 meq (60 meq total)  . pregabalin (LYRICA) 100 MG capsule Take 100 mg by mouth 2 (two) times daily.  . sertraline (ZOLOFT) 50 MG tablet Take 50 mg  by mouth every evening.   Marland Kitchen VICTOZA 18 MG/3ML SOPN INJECT 1.8MG SUBCUTANEOUSLY DAILY (NEEDS APPOINTMENT FOR REFILLS) (Patient taking differently: Inject 1.8 mg into the skin daily at lunchtime)  . [DISCONTINUED] furosemide (LASIX) 40 MG tablet Take 2 tablets (80 mg total) by mouth 2 (two) times a week. MONDAYS and FRIDAYS  . [DISCONTINUED] JARDIANCE 10 MG TABS tablet TAKE ONE TABLET BY MOUTH ONCE DAILY WITH  BREAKFAST (Patient taking differently: Take 10 mg by mouth once a day with breakfast)   No facility-administered encounter medications on file as of 07/30/2017.     PAST MEDICAL HISTORY:   Past Medical History:  Diagnosis Date  . Abnormal liver function     in the past.  . Anemia   . Arthritis  all over  . Bruises easily   . Cataract    right eye;immature  . CHF (congestive heart failure) ([place])   . Chronic back pain    stenosis  . Chronic cough   . Demand myocardial infarction [facility]) 2012   Demand Infarction in setting of Pancreatitis --> mild Troponin elevation, Non-obstructive CAD  . Depression    takes Abilify daily as well as Zoloft  . Diastolic heart failure 1610   Grade 1 diastolic Dysfunction by Echo   . Diverticulosis   . DM (diabetes mellitus) ([place])    takes Victoza daily as well as Lantus and Humalog  . Empty sella ([place])    on MRI in 2009.  . Glaucoma   . Headache(784.0)    last migraine-4-67yr ago  . History of blood transfusion    no abnormal reaction noted  . History of colon polyps    benign  . History of influenza    antibiotic given yesterday along with taking Delsym cough syrup  . HTN (hypertension)    takes BKinder Morgan Energydaily  . Hyperlipidemia    takes Lipitor daily  . Joint swelling   . Nocturia   . Obstructive sleep apnea   . Pancreatitis    takes Pancrelipase daily  . Parkinson's disease ([place]    takes Sinemet daily  . Peripheral edema    takes Lasix daily  . Peripheral neuropathy   . Pneumonia 2012  . Seizure disorder  ([place]   . Seizures ([place]    takes Lamictal daily and Primidone nightly;last seizure 2wks ago  . Urinary frequency   . Urinary urgency   . Varicose veins of both lower extremities with pain     PAST SURGICAL HISTORY:   Past Surgical History:  Procedure Laterality Date  . ABDOMINAL HYSTERECTOMY    . APPENDECTOMY    . BACK SURGERY    . CARDIAC CATHETERIZATION  2009/March 2012   2009: (Dr. KAlvie He[place] Nonobstructive CAD; 2012: Minimal CAD --> false positive stress test  . Cardiac Event Monitor  September-October 2017   Sinus rhythm with occasional PACs and artifact. No arrhythmias besides one short run of tachycardia.  . CHOLECYSTECTOMY    . COLONOSCOPY N/A 08/15/2012   Procedure: COLONOSCOPY;  Surgeon: WArta Silence MD;  Location: [facility];  Service: Endoscopy;  Laterality: N/A;  . COLONOSCOPY WITH PROPOFOL Left 06/17/2017   Procedure: COLONOSCOPY WITH PROPOFOL;  Surgeon: KRonnette Juniper MD;  [place]  Service: Gastroenterology;  Laterality: Left;  . ESOPHAGOGASTRODUODENOSCOPY    . eye cysts Bilateral   . LASIK    . NM MYOVIEW LTD  March 2012; Aug 2017   Probably normal Lexiscan Myoview. Mild reversible Anterior Defect ~ Breast Attenuation. (CRO ischemia):  FALSE POSITIVE by Cath; b) 8/'17: LOW RISK, No ischemia or Infarction.  Breast Attenuation not noted.  .Marland KitchenNM MYOVIEW LTD  01/2016   LOW RISK. No ischemia or infarction.  .Marland KitchenRECTAL POLYPECTOMY    . TONSILLECTOMY    . TRANSTHORACIC ECHOCARDIOGRAM  01/2016   Normal LV chamber size with moderate LVH pattern. EF 50-55%. Severe LA dilation. Moderate RA dilation. PA pressure elevated at 38 mmHg (mild as (  . TUBAL LIGATION    . vaginal cyst removed     several times    SOCIAL HISTORY:   Social History   Socioeconomic History  . Marital status: Divorced    Spouse name: Not on file  . Number of children: Not on file  . Years of  education: Not on file  . Highest education level: Not on file  Social Needs  .  Financial resource strain: Not on file  . Food insecurity - worry: Not on file  . Food insecurity - inability: Not on file  . Transportation needs - medical: Not on file  . Transportation needs - non-medical: Not on file  Occupational History  . Occupation: Disabled  Tobacco Use  . Smoking status: Former Smoker    Packs/day: 0.50    Years: 25.00    Pack years: 12.50    Types: Cigarettes  . Smokeless tobacco: Never Used  . Tobacco comment: quit smoking 20+ytrs ago  Substance and Sexual Activity  . Alcohol use: No  . Drug use: No  . Sexual activity: No    Birth control/protection: Surgical  Other Topics Concern  . Not on file  Social History Narrative   She lives in [place]. She has lots of family in the area and is accompanied by her sister.   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   She has a distant history of smoking, quit 20 years ago.    FAMILY HISTORY:   Family Status  Relation Name Status  . Father  Deceased  . Mother  Deceased  . Sister  Alive  . Brother  Alive  . Sister  Deceased  . Son [employer]  . Son [employer]  . Other  (Not Specified)    ROS:  Some cough.  SOB is better than in the past.  A complete 10 system review of systems was obtained and was unremarkable apart from what is mentioned above.  PHYSICAL EXAMINATION:    VITALS:   Vitals:   07/30/17 1328  BP: 118/70  Pulse: 64  SpO2: 92%  Weight: 271 lb (122.9 kg)  Height: 5' 0.5" (1.537 m)    GEN:  The patient appears stated age and is in NAD. HEENT:  Normocephalic, atraumatic.  The mucous membranes are moist. The superficial temporal arteries are without ropiness or tenderness. CV:  RRR Lungs:  CTAB Neck/HEME:  There are no carotid bruits bilaterally.  Neurological examination:  Orientation: The patient is alert and oriented x3. Fund of knowledge is appropriate.  Recent and remote memory are intact.  Attention and concentration are normal.     Able to name objects and repeat phrases. Cranial nerves: There is good facial symmetry. Pupils are equal round and reactive to light bilaterally. Fundoscopic exam is attempted but the disc margins are not well visualized bilaterally.  Extraocular muscles are intact. The visual fields are full to confrontational testing. The speech is fluent and clear. Soft palate rises symmetrically and there is no tongue deviation. Hearing is intact to conversational tone. Sensation: Sensation is intact to light and pinprick throughout (facial, trunk, extremities). Vibration is intact at the bilateral big toe. There is no extinction with double simultaneous stimulation. There is no sensory dermatomal level identified. Motor: Strength is 5/5 in the RUE/RLE.  There is decreased grasp on the left.  Strength in the left upper/left lower extremity is at least 4+-5-/5 but there is give way weakness that does improve fairly significantly with encouragement. Deep tendon reflexes: Deep tendon reflexes are 2/4 at the bilateral biceps, triceps, brachioradialis, patella and 1/4 at the bilateral achilles. Plantar responses are downgoing bilaterally.  Movement examination: Tone: There is normal tone in the bilateral upper extremities.  The tone in the lower extremities is normal.  Abnormal movements: No rest  or intention tremor.  No postural tremor. Coordination:  There is no decremation with RAM's, with any form of RAMS, including alternating supination and pronation of the forearm, hand opening and closing, finger taps, heel taps and toe taps. Gait and Station: The patient has difficulty arising out of a deep-seated chair without the use of the hands.  She pushes off of the chair.  She is slow to ambulate.  She has an antalgic gait.  She does not really bend the left knee when she walks much.  She ambulates with her cane.  When given a walker, she reports that it is even more difficult to walk and she is slower.     ASSESSMENT/PLAN:  1.  Parkinsonism, by history  -I had a long counseling session with the patient today.  I discussed with the patient that she likely had secondary parkinsonism due to Abilify.  I explained what that meant.  She did not look parkinsonian at all today and had been off Abilify for about 4 months.  I did explain to her that this could be because she is still on levodopa, but she had not taken her medication for 5 hours once I saw her, so I would have thought it would have been worn off.  We ultimately decided to have her come back and have me examine her off of levodopa, just to make sure that medication is not skewing this picture.  -I would recommend that she continue in physical therapy.  She does have evidence of an old stroke. I also think arthritic issues in the knees are making walking difficult.  She is doing physical therapy at home.  2. Epileptic and nonepileptic seizure  -Per [facility] records, patient has a history of both epileptic and PNES.  She is on Lamictal, 100 mg twice per day.  I explained that this is somewhat out of my area of expertise.  When at [facility], she was seeing epileptology and movement d/o.  She was willing to see my partner, Dr. Delice Lesch, who is an epileptologist and Dr. Delice Lesch likewise was agreeable.  Pt not driving.  Living with her sister and her sister helps her with needed ADL (dressing/shoes).    3.  Much greater than 50% of this visit was spent in counseling and coordinating care.  Total face to face time:  45 min.  This did not include the 40 min of record review which was detailed above, which was non face to face time.   Cc:  [name], MD

## 2017-07-30 ENCOUNTER — Ambulatory Visit: Payer: Medicare HMO | Admitting: Neurology

## 2017-07-30 ENCOUNTER — Encounter: Payer: Self-pay | Admitting: Neurology

## 2017-07-30 VITALS — BP 118/70 | HR 64 | Ht 60.5 in | Wt 271.0 lb

## 2017-07-30 DIAGNOSIS — I5030 Unspecified diastolic (congestive) heart failure: Secondary | ICD-10-CM | POA: Diagnosis not present

## 2017-07-30 DIAGNOSIS — G2111 Neuroleptic induced parkinsonism: Secondary | ICD-10-CM

## 2017-07-30 DIAGNOSIS — R0602 Shortness of breath: Secondary | ICD-10-CM | POA: Diagnosis not present

## 2017-07-30 DIAGNOSIS — M48061 Spinal stenosis, lumbar region without neurogenic claudication: Secondary | ICD-10-CM | POA: Diagnosis not present

## 2017-07-30 DIAGNOSIS — R569 Unspecified convulsions: Secondary | ICD-10-CM | POA: Diagnosis not present

## 2017-07-30 DIAGNOSIS — G4089 Other seizures: Secondary | ICD-10-CM | POA: Diagnosis not present

## 2017-07-30 DIAGNOSIS — G4733 Obstructive sleep apnea (adult) (pediatric): Secondary | ICD-10-CM | POA: Diagnosis not present

## 2017-07-30 DIAGNOSIS — I10 Essential (primary) hypertension: Secondary | ICD-10-CM | POA: Diagnosis not present

## 2017-07-30 NOTE — Patient Instructions (Signed)
Stay off Levodopa for 24 hours prior to your next appt.

## 2017-07-31 DIAGNOSIS — G40909 Epilepsy, unspecified, not intractable, without status epilepticus: Secondary | ICD-10-CM | POA: Diagnosis not present

## 2017-07-31 DIAGNOSIS — I110 Hypertensive heart disease with heart failure: Secondary | ICD-10-CM | POA: Diagnosis not present

## 2017-07-31 DIAGNOSIS — M1990 Unspecified osteoarthritis, unspecified site: Secondary | ICD-10-CM | POA: Diagnosis not present

## 2017-07-31 DIAGNOSIS — E1142 Type 2 diabetes mellitus with diabetic polyneuropathy: Secondary | ICD-10-CM | POA: Diagnosis not present

## 2017-07-31 DIAGNOSIS — G20 Parkinson's disease: Secondary | ICD-10-CM | POA: Diagnosis not present

## 2017-07-31 DIAGNOSIS — I2510 Atherosclerotic heart disease of native coronary artery without angina pectoris: Secondary | ICD-10-CM | POA: Diagnosis not present

## 2017-07-31 DIAGNOSIS — K5790 Diverticulosis of intestine, part unspecified, without perforation or abscess without bleeding: Secondary | ICD-10-CM | POA: Diagnosis not present

## 2017-07-31 DIAGNOSIS — D649 Anemia, unspecified: Secondary | ICD-10-CM | POA: Diagnosis not present

## 2017-07-31 DIAGNOSIS — I5032 Chronic diastolic (congestive) heart failure: Secondary | ICD-10-CM | POA: Diagnosis not present

## 2017-08-01 DIAGNOSIS — I110 Hypertensive heart disease with heart failure: Secondary | ICD-10-CM | POA: Diagnosis not present

## 2017-08-01 DIAGNOSIS — G20 Parkinson's disease: Secondary | ICD-10-CM | POA: Diagnosis not present

## 2017-08-01 DIAGNOSIS — K5790 Diverticulosis of intestine, part unspecified, without perforation or abscess without bleeding: Secondary | ICD-10-CM | POA: Diagnosis not present

## 2017-08-01 DIAGNOSIS — G40909 Epilepsy, unspecified, not intractable, without status epilepticus: Secondary | ICD-10-CM | POA: Diagnosis not present

## 2017-08-01 DIAGNOSIS — D649 Anemia, unspecified: Secondary | ICD-10-CM | POA: Diagnosis not present

## 2017-08-01 DIAGNOSIS — M1990 Unspecified osteoarthritis, unspecified site: Secondary | ICD-10-CM | POA: Diagnosis not present

## 2017-08-01 DIAGNOSIS — I2510 Atherosclerotic heart disease of native coronary artery without angina pectoris: Secondary | ICD-10-CM | POA: Diagnosis not present

## 2017-08-01 DIAGNOSIS — I5032 Chronic diastolic (congestive) heart failure: Secondary | ICD-10-CM | POA: Diagnosis not present

## 2017-08-01 DIAGNOSIS — E1142 Type 2 diabetes mellitus with diabetic polyneuropathy: Secondary | ICD-10-CM | POA: Diagnosis not present

## 2017-08-06 DIAGNOSIS — G40909 Epilepsy, unspecified, not intractable, without status epilepticus: Secondary | ICD-10-CM | POA: Diagnosis not present

## 2017-08-06 DIAGNOSIS — I2510 Atherosclerotic heart disease of native coronary artery without angina pectoris: Secondary | ICD-10-CM | POA: Diagnosis not present

## 2017-08-06 DIAGNOSIS — G20 Parkinson's disease: Secondary | ICD-10-CM | POA: Diagnosis not present

## 2017-08-06 DIAGNOSIS — M1990 Unspecified osteoarthritis, unspecified site: Secondary | ICD-10-CM | POA: Diagnosis not present

## 2017-08-06 DIAGNOSIS — K5790 Diverticulosis of intestine, part unspecified, without perforation or abscess without bleeding: Secondary | ICD-10-CM | POA: Diagnosis not present

## 2017-08-06 DIAGNOSIS — D649 Anemia, unspecified: Secondary | ICD-10-CM | POA: Diagnosis not present

## 2017-08-06 DIAGNOSIS — I110 Hypertensive heart disease with heart failure: Secondary | ICD-10-CM | POA: Diagnosis not present

## 2017-08-06 DIAGNOSIS — I5032 Chronic diastolic (congestive) heart failure: Secondary | ICD-10-CM | POA: Diagnosis not present

## 2017-08-06 DIAGNOSIS — E1142 Type 2 diabetes mellitus with diabetic polyneuropathy: Secondary | ICD-10-CM | POA: Diagnosis not present

## 2017-08-07 ENCOUNTER — Telehealth: Payer: Self-pay | Admitting: Cardiology

## 2017-08-07 DIAGNOSIS — K5790 Diverticulosis of intestine, part unspecified, without perforation or abscess without bleeding: Secondary | ICD-10-CM | POA: Diagnosis not present

## 2017-08-07 DIAGNOSIS — D649 Anemia, unspecified: Secondary | ICD-10-CM | POA: Diagnosis not present

## 2017-08-07 DIAGNOSIS — I5032 Chronic diastolic (congestive) heart failure: Secondary | ICD-10-CM | POA: Diagnosis not present

## 2017-08-07 DIAGNOSIS — I110 Hypertensive heart disease with heart failure: Secondary | ICD-10-CM | POA: Diagnosis not present

## 2017-08-07 DIAGNOSIS — G20 Parkinson's disease: Secondary | ICD-10-CM | POA: Diagnosis not present

## 2017-08-07 DIAGNOSIS — E1142 Type 2 diabetes mellitus with diabetic polyneuropathy: Secondary | ICD-10-CM | POA: Diagnosis not present

## 2017-08-07 DIAGNOSIS — M1990 Unspecified osteoarthritis, unspecified site: Secondary | ICD-10-CM | POA: Diagnosis not present

## 2017-08-07 DIAGNOSIS — G40909 Epilepsy, unspecified, not intractable, without status epilepticus: Secondary | ICD-10-CM | POA: Diagnosis not present

## 2017-08-07 DIAGNOSIS — I2510 Atherosclerotic heart disease of native coronary artery without angina pectoris: Secondary | ICD-10-CM | POA: Diagnosis not present

## 2017-08-07 NOTE — Telephone Encounter (Signed)
Spoke with patient and weights are as follows:  08/01/17  wt 268   08/02/17  wt 266  08/03/17  wt 266  08/04/17 wt 269  08/05/17 wt 269  08/06/17 wt 271 took a Metolazone  08/07/17 wt 272   Patient denies any shortness of breath. Advised to take an extra [place] today and call back tomorrow with an update. Patient is taking her Furosemide 80mg  Monday, Wednesday, and Friday only and 40 mg other days. Will discuss further with [name] B PA who recently saw patient after update tomorrow.

## 2017-08-07 NOTE — Telephone Encounter (Signed)
Pt c/o swelling: STAT is pt has developed SOB within 24 hours  1) How much weight have you gained and in what time span? 11 lbs over the course of three days, since Sunday patient went from 266 to 277lbs  2) If swelling, where is the swelling located? Legs but more in the left leg and ankle   3) Are you currently taking a fluid pill? yes  4) Are you currently SOB? No  5) Do you have a log of your daily weights (if so, list)? n/a  6) Have you gained 3 pounds in a day or 5 pounds in a week? yes  7) Have you traveled recently? No    Patient is gaining weight from 266 to 277 and has already taken two pills today. Patient states that her legs are puffy and more so on the left leg.

## 2017-08-08 DIAGNOSIS — E1142 Type 2 diabetes mellitus with diabetic polyneuropathy: Secondary | ICD-10-CM | POA: Diagnosis not present

## 2017-08-08 DIAGNOSIS — D649 Anemia, unspecified: Secondary | ICD-10-CM | POA: Diagnosis not present

## 2017-08-08 DIAGNOSIS — K5790 Diverticulosis of intestine, part unspecified, without perforation or abscess without bleeding: Secondary | ICD-10-CM | POA: Diagnosis not present

## 2017-08-08 DIAGNOSIS — G20 Parkinson's disease: Secondary | ICD-10-CM | POA: Diagnosis not present

## 2017-08-08 DIAGNOSIS — I110 Hypertensive heart disease with heart failure: Secondary | ICD-10-CM | POA: Diagnosis not present

## 2017-08-08 DIAGNOSIS — G40909 Epilepsy, unspecified, not intractable, without status epilepticus: Secondary | ICD-10-CM | POA: Diagnosis not present

## 2017-08-08 DIAGNOSIS — I5032 Chronic diastolic (congestive) heart failure: Secondary | ICD-10-CM | POA: Diagnosis not present

## 2017-08-08 DIAGNOSIS — I2510 Atherosclerotic heart disease of native coronary artery without angina pectoris: Secondary | ICD-10-CM | POA: Diagnosis not present

## 2017-08-08 DIAGNOSIS — M1990 Unspecified osteoarthritis, unspecified site: Secondary | ICD-10-CM | POA: Diagnosis not present

## 2017-08-08 NOTE — Telephone Encounter (Signed)
Patient calling, Patient states that she was instructed to call back with her weight. Patient weight today is 265 lb

## 2017-08-08 NOTE — Telephone Encounter (Signed)
Discussed with Rhonda B PA and ok to continue the Lasix 80 mg 3 times a week and 40 mg other days as long as it is keeping her weight under control. Try to limit Metolazone intake and call if needing to take frequently. Left message to call back

## 2017-08-09 DIAGNOSIS — E1165 Type 2 diabetes mellitus with hyperglycemia: Secondary | ICD-10-CM | POA: Diagnosis not present

## 2017-08-09 NOTE — Telephone Encounter (Signed)
Discussed further with [name] B PA and will continue as planned, advised patient

## 2017-08-09 NOTE — Telephone Encounter (Signed)
Patient calling, states that weight is 269 today.

## 2017-08-15 DIAGNOSIS — G40909 Epilepsy, unspecified, not intractable, without status epilepticus: Secondary | ICD-10-CM | POA: Diagnosis not present

## 2017-08-15 DIAGNOSIS — E1142 Type 2 diabetes mellitus with diabetic polyneuropathy: Secondary | ICD-10-CM | POA: Diagnosis not present

## 2017-08-15 DIAGNOSIS — I2510 Atherosclerotic heart disease of native coronary artery without angina pectoris: Secondary | ICD-10-CM | POA: Diagnosis not present

## 2017-08-15 DIAGNOSIS — I5032 Chronic diastolic (congestive) heart failure: Secondary | ICD-10-CM | POA: Diagnosis not present

## 2017-08-15 DIAGNOSIS — G20 Parkinson's disease: Secondary | ICD-10-CM | POA: Diagnosis not present

## 2017-08-15 DIAGNOSIS — D649 Anemia, unspecified: Secondary | ICD-10-CM | POA: Diagnosis not present

## 2017-08-15 DIAGNOSIS — M1990 Unspecified osteoarthritis, unspecified site: Secondary | ICD-10-CM | POA: Diagnosis not present

## 2017-08-15 DIAGNOSIS — K5790 Diverticulosis of intestine, part unspecified, without perforation or abscess without bleeding: Secondary | ICD-10-CM | POA: Diagnosis not present

## 2017-08-15 DIAGNOSIS — I110 Hypertensive heart disease with heart failure: Secondary | ICD-10-CM | POA: Diagnosis not present

## 2017-08-16 DIAGNOSIS — D649 Anemia, unspecified: Secondary | ICD-10-CM | POA: Diagnosis not present

## 2017-08-16 DIAGNOSIS — M1990 Unspecified osteoarthritis, unspecified site: Secondary | ICD-10-CM | POA: Diagnosis not present

## 2017-08-16 DIAGNOSIS — I2510 Atherosclerotic heart disease of native coronary artery without angina pectoris: Secondary | ICD-10-CM | POA: Diagnosis not present

## 2017-08-16 DIAGNOSIS — K5790 Diverticulosis of intestine, part unspecified, without perforation or abscess without bleeding: Secondary | ICD-10-CM | POA: Diagnosis not present

## 2017-08-16 DIAGNOSIS — I5032 Chronic diastolic (congestive) heart failure: Secondary | ICD-10-CM | POA: Diagnosis not present

## 2017-08-16 DIAGNOSIS — E1142 Type 2 diabetes mellitus with diabetic polyneuropathy: Secondary | ICD-10-CM | POA: Diagnosis not present

## 2017-08-16 DIAGNOSIS — I110 Hypertensive heart disease with heart failure: Secondary | ICD-10-CM | POA: Diagnosis not present

## 2017-08-16 DIAGNOSIS — G20 Parkinson's disease: Secondary | ICD-10-CM | POA: Diagnosis not present

## 2017-08-16 DIAGNOSIS — G40909 Epilepsy, unspecified, not intractable, without status epilepticus: Secondary | ICD-10-CM | POA: Diagnosis not present

## 2017-08-21 DIAGNOSIS — I110 Hypertensive heart disease with heart failure: Secondary | ICD-10-CM | POA: Diagnosis not present

## 2017-08-21 DIAGNOSIS — K5790 Diverticulosis of intestine, part unspecified, without perforation or abscess without bleeding: Secondary | ICD-10-CM | POA: Diagnosis not present

## 2017-08-21 DIAGNOSIS — G20 Parkinson's disease: Secondary | ICD-10-CM | POA: Diagnosis not present

## 2017-08-21 DIAGNOSIS — E1142 Type 2 diabetes mellitus with diabetic polyneuropathy: Secondary | ICD-10-CM | POA: Diagnosis not present

## 2017-08-21 DIAGNOSIS — I2510 Atherosclerotic heart disease of native coronary artery without angina pectoris: Secondary | ICD-10-CM | POA: Diagnosis not present

## 2017-08-21 DIAGNOSIS — G40909 Epilepsy, unspecified, not intractable, without status epilepticus: Secondary | ICD-10-CM | POA: Diagnosis not present

## 2017-08-21 DIAGNOSIS — D649 Anemia, unspecified: Secondary | ICD-10-CM | POA: Diagnosis not present

## 2017-08-21 DIAGNOSIS — M1990 Unspecified osteoarthritis, unspecified site: Secondary | ICD-10-CM | POA: Diagnosis not present

## 2017-08-21 DIAGNOSIS — I5032 Chronic diastolic (congestive) heart failure: Secondary | ICD-10-CM | POA: Diagnosis not present

## 2017-09-02 NOTE — Progress Notes (Deleted)
[name] was seen today in the movement disorders clinic for neurologic consultation at the request of [name], MD.  The consultation is for the evaluation of PD.   Pt is accompanied by her sister who supplements the history.   Patient has most recently been under the care of Dr. Hall Busing.  She last saw her about 1 year ago.  I have reviewed records made available to me.  Dr. Hall Busing discussed with her drug-induced parkinsonism due to Abilify versus Parkinson's disease.  A DaT scan was offered but declined.  Patient's symptoms were noted to be stable for 18 months.  Patient noted that mood was good on Abilify.  Pt was started on carbidopa/levodopa 25/100 in 2014 and has been on it ever since.  Has been on primidone for this as well but was d/c last march.  Pt does state that she stopped abilify 4 months ago because she heard it caused weight gain.   Of note, patient has a hx of both epileptic and nonepileptic seizure per records.  pts sister states that last event was before xmas and cannot say whether it was epileptic or nonepileptic event.  Sister states was staring and "trembling" for 5 min.  She will be sluggish for 30 min and then back to herself.  She doesn't drive   02/08/70 update: Patient is seen today in follow-up.  She is off of levodopa.  She has been off of this since ***.  PREVIOUS MEDICATIONS: Sinemet  ALLERGIES:   Allergies  Allergen Reactions  . Penicillins Hives    Has patient had a PCN reaction causing immediate rash, facial/tongue/throat swelling, SOB or lightheadedness with hypotension: Yes Has patient had a PCN reaction causing severe rash involving mucus membranes or skin necrosis: No Has patient had a PCN reaction that required hospitalization: No Has patient had a PCN reaction occurring within the last 10 years: No If all of the above answers are "NO", then may proceed with Cephalosporin use.   . Sulfa Antibiotics Hives  . Aspirin Other (See Comments)     On  aspirin 81 mg - Rectal bleeding in dec 2018  . Codeine     Headache and makes the patient feel "off"  . Other Rash    Bleach    CURRENT MEDICATIONS:  Outpatient Encounter Medications as of 09/04/2017  Medication Sig  . acetaminophen (TYLENOL) 500 MG tablet Take 500-1,000 mg by mouth every 4 (four) hours as needed (for pain).   Marland Kitchen atorvastatin (LIPITOR) 10 MG tablet Take 10 mg by mouth daily.  . B Complex-C (B-COMPLEX WITH VITAMIN C) tablet Take 1 tablet by mouth daily.  . Blood Glucose Monitoring Suppl (ACCU-CHEK NANO SMARTVIEW) W/DEVICE KIT Use to check blood sugar 3 times per day dx code E11.65  . carbidopa-levodopa (SINEMET IR) 25-100 MG per tablet Take 1 tablet by mouth 3 (three) times daily.   . carvedilol (COREG) 25 MG tablet Take 1 tablet (25 mg total) by mouth 2 (two) times daily.  . Cholecalciferol (VITAMIN D-3) 1000 units CAPS Take 1,000 Units by mouth daily.  . cholestyramine (QUESTRAN) 4 G packet Take 4 g by mouth 2 (two) times daily as needed (for diarrhea).   . furosemide (LASIX) 40 MG tablet Take 40 mg ( 1 tablet) by mouth on SUN,TUES,WED., THUR.,SAT. ONLY  . insulin aspart (NOVOLOG FLEXPEN) 100 UNIT/ML FlexPen Inject4- 6 units before breakfast and lunch and 10-12 units before dinner (Patient taking differently: Inject 6-10 Units into the skin  See admin instructions. 6 units before breakfast then 6 units before lunch then 10 units before dinner (evening meal))  . Insulin Glargine (TOUJEO SOLOSTAR) 300 UNIT/ML SOPN Inject 40 Units into the skin daily. (Patient taking differently: Inject 36 Units into the skin at bedtime. )  . Insulin Pen Needle 31G X 8 MM MISC Use to inject medication 4 times daily  . isosorbide mononitrate (IMDUR) 30 MG 24 hr tablet Take 1 tablet (30 mg total) by mouth daily.  Marland Kitchen lamoTRIgine (LAMICTAL) 100 MG tablet Take 1 tablet (100 mg total) by mouth 2 (two) times daily.  . Menthol, Topical Analgesic, 3.5 % GEL Apply 1 application topically as needed (for  neuropathy in feet).   . metolazone (ZAROXOLYN) 2.5 MG tablet Take 1 tablet (2.5 mg total) by mouth daily as needed. If weight is  Increase 3 lbs overnight or 5 lbs. in one week ,take 30 minutes before furosemide.  . Multiple Vitamins-Minerals (MULTIVITAMIN WITH MINERALS) tablet Take 1 tablet by mouth daily.  Marland Kitchen olmesartan (BENICAR) 40 MG tablet Take 40 mg by mouth daily.  . Pancrelipase, Lip-Prot-Amyl, 25000 UNITS CPEP Take 25,000-50,000 Units by mouth See admin instructions. 50,000 units with breakfast then 25,000 units with lunch then 50,000 units with dinner (evening meal)  . potassium chloride SA (K-DUR,KLOR-CON) 20 MEQ tablet Take 2 tablets (40 mEq total) by mouth daily. On the day you take metolazone, take an extra 20 meq (60 meq total)  . pregabalin (LYRICA) 100 MG capsule Take 100 mg by mouth 2 (two) times daily.  . sertraline (ZOLOFT) 50 MG tablet Take 50 mg by mouth every evening.   Marland Kitchen VICTOZA 18 MG/3ML SOPN INJECT 1.8MG SUBCUTANEOUSLY DAILY (NEEDS APPOINTMENT FOR REFILLS) (Patient taking differently: Inject 1.8 mg into the skin daily at lunchtime)   No facility-administered encounter medications on file as of 09/04/2017.     PAST MEDICAL HISTORY:   Past Medical History:  Diagnosis Date  . Abnormal liver function     in the past.  . Anemia   . Arthritis    all over  . Bruises easily   . Cataract    right eye;immature  . CHF (congestive heart failure) ([place])   . Chronic back pain    stenosis  . Chronic cough   . Demand myocardial infarction [facility]) 2012   Demand Infarction in setting of Pancreatitis --> mild Troponin elevation, Non-obstructive CAD  . Depression    takes Abilify daily as well as Zoloft  . Diastolic heart failure 1751   Grade 1 diastolic Dysfunction by Echo   . Diverticulosis   . DM (diabetes mellitus) ([place])    takes Victoza daily as well as Lantus and Humalog  . Empty sella ([place])    on MRI in 2009.  . Glaucoma   . Headache(784.0)    last migraine-4-44yr  ago  . History of blood transfusion    no abnormal reaction noted  . History of colon polyps    benign  . History of influenza    antibiotic given yesterday along with taking Delsym cough syrup  . HTN (hypertension)    takes BKinder Morgan Energydaily  . Hyperlipidemia    takes Lipitor daily  . Joint swelling   . Nocturia   . Obstructive sleep apnea   . Pancreatitis    takes Pancrelipase daily  . Parkinson's disease ([place]    takes Sinemet daily  . Peripheral edema    takes Lasix daily  . Peripheral neuropathy   .  Pneumonia 2012  . Seizure disorder ([place])   . Seizures ([place])    takes Lamictal daily and Primidone nightly;last seizure 2wks ago  . Urinary frequency   . Urinary urgency   . Varicose veins of both lower extremities with pain     PAST SURGICAL HISTORY:   Past Surgical History:  Procedure Laterality Date  . ABDOMINAL HYSTERECTOMY    . APPENDECTOMY    . BACK SURGERY    . CARDIAC CATHETERIZATION  2009/March 2012   2009: (Dr. Alvie Heidelberg) Nonobstructive CAD; 2012: Minimal CAD --> false positive stress test  . Cardiac Event Monitor  September-October 2017   Sinus rhythm with occasional PACs and artifact. No arrhythmias besides one short run of tachycardia.  . CHOLECYSTECTOMY    . COLONOSCOPY N/A 08/15/2012   Procedure: COLONOSCOPY;  Surgeon: Arta Silence, MD;  Location: [facility];  Service: Endoscopy;  Laterality: N/A;  . COLONOSCOPY WITH PROPOFOL Left 06/17/2017   Procedure: COLONOSCOPY WITH PROPOFOL;  Surgeon: [name], MD;  Location: [place];  Service: Gastroenterology;  Laterality: Left;  . ESOPHAGOGASTRODUODENOSCOPY    . eye cysts Bilateral   . LASIK    . NM MYOVIEW LTD  March 2012; Aug 2017   Probably normal Lexiscan Myoview. Mild reversible Anterior Defect ~ Breast Attenuation. (CRO ischemia):  FALSE POSITIVE by Cath; b) 8/'17: LOW RISK, No ischemia or Infarction.  Breast Attenuation not noted.  Marland Kitchen NM MYOVIEW LTD  01/2016   LOW RISK. No  ischemia or infarction.  Marland Kitchen RECTAL POLYPECTOMY    . TONSILLECTOMY    . TRANSTHORACIC ECHOCARDIOGRAM  01/2016   Normal LV chamber size with moderate LVH pattern. EF 50-55%. Severe LA dilation. Moderate RA dilation. PA pressure elevated at 38 mmHg (mild as (  . TUBAL LIGATION    . vaginal cyst removed     several times    SOCIAL HISTORY:   Social History   Socioeconomic History  . Marital status: Divorced    Spouse name: Not on file  . Number of children: Not on file  . Years of education: Not on file  . Highest education level: Not on file  Social Needs  . Financial resource strain: Not on file  . Food insecurity - worry: Not on file  . Food insecurity - inability: Not on file  . Transportation needs - medical: Not on file  . Transportation needs - non-medical: Not on file  Occupational History  . Occupation: Disabled    Comment: CNA  Tobacco Use  . Smoking status: Former Smoker    Packs/day: 0.50    Years: 25.00    Pack years: 12.50    Types: Cigarettes  . Smokeless tobacco: Never Used  . Tobacco comment: quit smoking 20+ytrs ago  Substance and Sexual Activity  . Alcohol use: No  . Drug use: No  . Sexual activity: No    Birth control/protection: Surgical  Other Topics Concern  . Not on file  Social History Narrative   She lives in [place]. She has lots of family in the area and is accompanied by her sister.   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   She has a distant history of smoking, quit 20 years ago.    FAMILY HISTORY:   Family Status  Relation Name Status  . Father  Deceased  . Mother  Deceased  . Sister  Alive  . Brother  Alive  . Sister  Deceased  . Son [employer]  .  Son [employer]  . Other  (Not Specified)    ROS:  Some cough.  SOB is better than in the past.  A complete 10 system review of systems was obtained and was unremarkable apart from what is mentioned above.  PHYSICAL EXAMINATION:     VITALS:   There were no vitals filed for this visit.  GEN:  The patient appears stated age and is in NAD. HEENT:  Normocephalic, atraumatic.  The mucous membranes are moist. The superficial temporal arteries are without ropiness or tenderness. CV:  RRR Lungs:  CTAB Neck/HEME:  There are no carotid bruits bilaterally.  Neurological examination:  Orientation: The patient is alert and oriented x3. Fund of knowledge is appropriate.  Recent and remote memory are intact.  Attention and concentration are normal.    Able to name objects and repeat phrases. Cranial nerves: There is good facial symmetry. Pupils are equal round and reactive to light bilaterally. Fundoscopic exam is attempted but the disc margins are not well visualized bilaterally.  Extraocular muscles are intact. The visual fields are full to confrontational testing. The speech is fluent and clear. Soft palate rises symmetrically and there is no tongue deviation. Hearing is intact to conversational tone. Sensation: Sensation is intact to light and pinprick throughout (facial, trunk, extremities). Vibration is intact at the bilateral big toe. There is no extinction with double simultaneous stimulation. There is no sensory dermatomal level identified. Motor: Strength is 5/5 in the RUE/RLE.  There is decreased grasp on the left.  Strength in the left upper/left lower extremity is at least 4+-5-/5 but there is give way weakness that does improve fairly significantly with encouragement. Deep tendon reflexes: Deep tendon reflexes are 2/4 at the bilateral biceps, triceps, brachioradialis, patella and 1/4 at the bilateral achilles. Plantar responses are downgoing bilaterally.  Movement examination: Tone: There is normal tone in the bilateral upper extremities.  The tone in the lower extremities is normal.  Abnormal movements: No rest or intention tremor.  No postural tremor. Coordination:  There is no decremation with RAM's, with any form of  RAMS, including alternating supination and pronation of the forearm, hand opening and closing, finger taps, heel taps and toe taps. Gait and Station: The patient has difficulty arising out of a deep-seated chair without the use of the hands.  She pushes off of the chair.  She is slow to ambulate.  She has an antalgic gait.  She does not really bend the left knee when she walks much.  She ambulates with her cane.  When given a walker, she reports that it is even more difficult to walk and she is slower.    ASSESSMENT/PLAN:  1.  Parkinsonism, by history  -I had a long counseling session with the patient today.  I discussed with the patient that she likely had secondary parkinsonism due to Abilify.  I explained what that meant.  She did not look parkinsonian at all today and had been off Abilify for about 4 months.  I did explain to her that this could be because she is still on levodopa, but she had not taken her medication for 5 hours once I saw her, so I would have thought it would have been worn off.  We ultimately decided to have her come back and have me examine her off of levodopa, just to make sure that medication is not skewing this picture.  -I would recommend that she continue in physical therapy.  She does have evidence of an  old stroke. I also think arthritic issues in the knees are making walking difficult.  She is doing physical therapy at home.  2. Epileptic and nonepileptic seizure  -Per [facility] records, patient has a history of both epileptic and PNES.  She is on Lamictal, 100 mg twice per day.  I explained that this is somewhat out of my area of expertise.  When at [facility], she was seeing epileptology and movement d/o.  She was willing to see my partner, Dr. Delice Lesch, who is an epileptologist and Dr. Delice Lesch likewise was agreeable.  Pt not driving.  Living with her sister and her sister helps her with needed ADL (dressing/shoes).    3.  Much greater than 50% of this visit was spent in  counseling and coordinating care.  Total face to face time:  45 min.  This did not include the 40 min of record review which was detailed above, which was non face to face time.   Cc:  [name], MD

## 2017-09-04 ENCOUNTER — Ambulatory Visit: Payer: Medicare HMO | Admitting: Neurology

## 2017-09-06 ENCOUNTER — Ambulatory Visit: Payer: Medicare HMO | Admitting: Cardiology

## 2017-09-14 ENCOUNTER — Encounter (HOSPITAL_COMMUNITY): Payer: Self-pay | Admitting: *Deleted

## 2017-09-14 ENCOUNTER — Ambulatory Visit (HOSPITAL_COMMUNITY)
Admission: EM | Admit: 2017-09-14 | Discharge: 2017-09-14 | Disposition: A | Discharge status: Home / Self Care | Payer: Medicare HMO | Attending: Family Medicine | Admitting: Family Medicine

## 2017-09-14 ENCOUNTER — Other Ambulatory Visit: Payer: Self-pay

## 2017-09-14 DIAGNOSIS — J40 Bronchitis, not specified as acute or chronic: Secondary | ICD-10-CM

## 2017-09-14 MED ORDER — PREDNISONE 20 MG PO TABS: ORAL_TABLET | ORAL | 0 refills | Status: DC

## 2017-09-14 MED ORDER — BENZONATATE 200 MG PO CAPS: 200.0000 mg | ORAL_CAPSULE | Freq: Two times a day (BID) | ORAL | 0 refills | Status: DC | PRN

## 2017-09-14 MED ORDER — AZITHROMYCIN 250 MG PO TABS: 250.0000 mg | ORAL_TABLET | Freq: Every day | ORAL | 0 refills | Status: DC

## 2017-09-14 NOTE — Discharge Instructions (Signed)
Follow up with your primary care provider if symptoms don't improve in 3 days

## 2017-09-14 NOTE — ED Triage Notes (Signed)
Coughing causing side pain and it takes a while for her to stop cough, nasal drainage and congestion, light headaches, eye pressure from coughing

## 2017-09-14 NOTE — ED Provider Notes (Signed)
[place]   [id number] 09/14/17 Arrival Time: 1352   SUBJECTIVE:  [name] is a 72 y.o. female who presents to the urgent care with complaint of cough for three weeks.  No h/o smoking or asthma.  Patient more short of breath.  No fever or increasing edema.  Patient needs three pillows to sleep.     Past Medical History:  Diagnosis Date  . Abnormal liver function     in the past.  . Anemia   . Arthritis    all over  . Bruises easily   . Cataract    right eye;immature  . CHF (congestive heart failure) ([place])   . Chronic back pain    stenosis  . Chronic cough   . Demand myocardial infarction [facility]) 2012   Demand Infarction in setting of Pancreatitis --> mild Troponin elevation, Non-obstructive CAD  . Depression    takes Abilify daily as well as Zoloft  . Diastolic heart failure 1308   Grade 1 diastolic Dysfunction by Echo   . Diverticulosis   . DM (diabetes mellitus) ([place])    takes Victoza daily as well as Lantus and Humalog  . Empty sella ([place])    on MRI in 2009.  . Glaucoma   . Headache(784.0)    last migraine-4-81yrs ago  . History of blood transfusion    no abnormal reaction noted  . History of colon polyps    benign  . History of influenza    antibiotic given yesterday along with taking Delsym cough syrup  . HTN (hypertension)    takes [employer] daily  . Hyperlipidemia    takes Lipitor daily  . Joint swelling   . Nocturia   . Obstructive sleep apnea   . Pancreatitis    takes Pancrelipase daily  . Parkinson's disease ([place])    takes Sinemet daily  . Peripheral edema    takes Lasix daily  . Peripheral neuropathy   . Pneumonia 2012  . Seizure disorder ([place])   . Seizures ([place])    takes Lamictal daily and Primidone nightly;last seizure 2wks ago  . Urinary frequency   . Urinary urgency   . Varicose veins of both lower extremities with pain    Family History  Problem Relation Age of Onset  . Allergies Father   .  Heart disease Father        before 33  . Heart failure Father   . Hypertension Father   . Hyperlipidemia Father   . Heart disease Mother   . Diabetes Mother   . Hypertension Mother   . Hyperlipidemia Mother   . Hypertension Sister   . Heart disease Sister        before 14  . Hyperlipidemia Sister   . Diabetes Brother   . Hypertension Brother   . Diabetes Sister   . Hypertension Sister   . Diabetes Son   . Hypertension Son   . Cancer Other    Social History   Socioeconomic History  . Marital status: Divorced    Spouse name: Not on file  . Number of children: Not on file  . Years of education: Not on file  . Highest education level: Not on file  Occupational History  . Occupation: Disabled    Comment: CNA  Social Needs  . Financial resource strain: Not on file  . Food insecurity:    Worry: Not on file    Inability: Not on file  . Transportation needs:  Medical: Not on file    Non-medical: Not on file  Tobacco Use  . Smoking status: Former Smoker    Packs/day: 0.50    Years: 25.00    Pack years: 12.50    Types: Cigarettes  . Smokeless tobacco: Never Used  . Tobacco comment: quit smoking 20+ytrs ago  Substance and Sexual Activity  . Alcohol use: No  . Drug use: No  . Sexual activity: Never    Birth control/protection: Surgical  Lifestyle  . Physical activity:    Days per week: Not on file    Minutes per session: Not on file  . Stress: Not on file  Relationships  . Social connections:    Talks on phone: Not on file    Gets together: Not on file    Attends religious service: Not on file    Active member of club or organization: Not on file    Attends meetings of clubs or organizations: Not on file    Relationship status: Not on file  . Intimate partner violence:    Fear of current or ex partner: Not on file    Emotionally abused: Not on file    Physically abused: Not on file    Forced sexual activity: Not on file  Other Topics Concern  . Not on file   Social History Narrative   She lives in [place]. She has lots of family in the area and is accompanied by her sister.   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   She has a distant history of smoking, quit 20 years ago.   Current Meds  Medication Sig  . acetaminophen (TYLENOL) 500 MG tablet Take 500-1,000 mg by mouth every 4 (four) hours as needed (for pain).    Allergies  Allergen Reactions  . Penicillins Hives    Has patient had a PCN reaction causing immediate rash, facial/tongue/throat swelling, SOB or lightheadedness with hypotension: Yes Has patient had a PCN reaction causing severe rash involving mucus membranes or skin necrosis: No Has patient had a PCN reaction that required hospitalization: No Has patient had a PCN reaction occurring within the last 10 years: No If all of the above answers are "NO", then may proceed with Cephalosporin use.   . Sulfa Antibiotics Hives  . Aspirin Other (See Comments)     On aspirin 81 mg - Rectal bleeding in dec 2018  . Codeine     Headache and makes the patient feel "off"  . Other Rash    Bleach      ROS: As per HPI, remainder of ROS negative.   OBJECTIVE:   Vitals:   09/14/17 1415  BP: (!) 122/50  Pulse: 64  Temp: 98.4 F (36.9 C)  TempSrc: Oral  SpO2: 95%     General appearance: alert; no distress Eyes: PERRL; EOMI; conjunctiva normal HENT: normocephalic; atraumatic; TMs normal, canal normal, external ears normal without trauma; nasal mucosa normal; oral mucosa normal Neck: supple Lungs: ronchi on auscultation bilaterally Heart: regular rate and rhythm Back: no CVA tenderness Extremities: no cyanosis or edema; symmetrical with no gross deformities Skin: warm and dry Neurologic: uses cane and walks slowly; grossly normal Psychological: alert and cooperative; normal mood and affect      Labs:  Results for orders placed or performed in visit on 18/56/31  Basic metabolic panel   Result Value Ref Range   Glucose 87 65 - 99 mg/dL   BUN 19 8 - 27 mg/dL  Creatinine, Ser 1.13 (H) 0.57 - 1.00 mg/dL   GFR calc non Af Amer 50 (L) >59 mL/min/1.73   GFR calc Af Amer 57 (L) >59 mL/min/1.73   BUN/Creatinine Ratio 17 12 - 28   Sodium 141 134 - 144 mmol/L   Potassium 4.5 3.5 - 5.2 mmol/L   Chloride 97 96 - 106 mmol/L   CO2 26 20 - 29 mmol/L   Calcium 9.4 8.7 - 10.3 mg/dL    Labs Reviewed - No data to display  No results found.     ASSESSMENT & PLAN:  1. Bronchitis     Meds ordered this encounter  Medications  . azithromycin (ZITHROMAX) 250 MG tablet    Sig: Take 1 tablet (250 mg total) by mouth daily. Take first 2 tablets together, then 1 every day until finished.    Dispense:  6 tablet    Refill:  0  . benzonatate (TESSALON) 200 MG capsule    Sig: Take 1 capsule (200 mg total) by mouth 2 (two) times daily as needed for cough.    Dispense:  20 capsule    Refill:  0  . predniSONE (DELTASONE) 20 MG tablet    Sig: One daily with food    Dispense:  5 tablet    Refill:  0  Follow up with your primary care provider if symptoms don't improve in 3 days Reviewed expectations re: course of current medical issues. Questions answered. Outlined signs and symptoms indicating need for more acute intervention. Patient verbalized understanding. After Visit Summary given.       Robyn Haber, MD 09/14/17 1433

## 2017-09-26 DIAGNOSIS — I5032 Chronic diastolic (congestive) heart failure: Secondary | ICD-10-CM | POA: Diagnosis not present

## 2017-09-26 DIAGNOSIS — N182 Chronic kidney disease, stage 2 (mild): Secondary | ICD-10-CM | POA: Diagnosis not present

## 2017-09-26 DIAGNOSIS — N08 Glomerular disorders in diseases classified elsewhere: Secondary | ICD-10-CM | POA: Diagnosis not present

## 2017-09-26 DIAGNOSIS — E1122 Type 2 diabetes mellitus with diabetic chronic kidney disease: Secondary | ICD-10-CM | POA: Diagnosis not present

## 2017-09-26 DIAGNOSIS — N183 Chronic kidney disease, stage 3 (moderate): Secondary | ICD-10-CM | POA: Diagnosis not present

## 2017-09-30 ENCOUNTER — Telehealth: Payer: Self-pay | Admitting: Cardiology

## 2017-09-30 NOTE — Telephone Encounter (Signed)
Left message to call back  

## 2017-09-30 NOTE — Telephone Encounter (Signed)
F/U Call: ° °Patient returning call °

## 2017-09-30 NOTE — Telephone Encounter (Signed)
Spoke with patient and she has been having weight gain and her weight is now running 278-282. She is having swelling in his legs and some shortness of breath. She did take Metolazone Friday and did not notice increase in urine output like she has in the past. She does have an appointment with Dr Ellyn Hack Monday 4/15. She is currently taking Lasix 80 mg daily x 3 days and 40 mg daily other days. Discussed with Doreene Burke PA and ok to use the Metolazone today, Wednesday, and Friday if needed and keep appointment 4/15 with Dr Ellyn Hack. Call back if worse or no improvement. Patient verbalized understanding.

## 2017-09-30 NOTE — Telephone Encounter (Signed)
Pt c/o swelling: STAT is pt has developed SOB within 24 hours  1) How much weight have you gained and in what time span? 4-5 pounds within 2-3 days   If swelling, where is the swelling located?feet and legs   Are you currently taking a fluid pill?y es  2) Are you currently SOB? A little   Do you have a log of your daily weights (if so, list)? No  Have you gained 3 pounds in a day or 5 pounds in a week? 3-4 pounds in 2-3 days   3) Have you traveled recently?no

## 2017-10-07 ENCOUNTER — Encounter: Payer: Self-pay | Admitting: Cardiology

## 2017-10-07 ENCOUNTER — Ambulatory Visit: Payer: Medicare HMO | Admitting: Cardiology

## 2017-10-07 VITALS — BP 144/72 | HR 60 | Ht 61.0 in | Wt 277.8 lb

## 2017-10-07 DIAGNOSIS — I83893 Varicose veins of bilateral lower extremities with other complications: Secondary | ICD-10-CM | POA: Diagnosis not present

## 2017-10-07 DIAGNOSIS — I110 Hypertensive heart disease with heart failure: Secondary | ICD-10-CM | POA: Diagnosis not present

## 2017-10-07 DIAGNOSIS — I5032 Chronic diastolic (congestive) heart failure: Secondary | ICD-10-CM

## 2017-10-07 DIAGNOSIS — Z6841 Body Mass Index (BMI) 40.0 and over, adult: Secondary | ICD-10-CM

## 2017-10-07 DIAGNOSIS — G4733 Obstructive sleep apnea (adult) (pediatric): Secondary | ICD-10-CM | POA: Insufficient documentation

## 2017-10-07 DIAGNOSIS — I2510 Atherosclerotic heart disease of native coronary artery without angina pectoris: Secondary | ICD-10-CM

## 2017-10-07 MED ORDER — ISOSORBIDE MONONITRATE ER 60 MG PO TB24: 60.0000 mg | ORAL_TABLET | Freq: Every day | ORAL | 6 refills | Status: DC

## 2017-10-07 MED ORDER — METOLAZONE 2.5 MG PO TABS: 2.5000 mg | ORAL_TABLET | ORAL | 6 refills | Status: DC

## 2017-10-07 MED ORDER — FUROSEMIDE 40 MG PO TABS: ORAL_TABLET | ORAL | 3 refills | Status: DC

## 2017-10-07 NOTE — Progress Notes (Signed)
PCP: [name], MD  Clinic Note: Chief Complaint  Patient presents with  . Follow-up    Trying to maintain dry weights.  Relatively stable.  . Congestive Heart Failure    Hypertensive heart disease with diastolic heart failure  . Sleep Apnea    Not currently on CPAP    HPI: [name] is a 72 y.o. female with a PMH below who presents today for 4 month f/u for mostly diastolic dysfunction related heart failure along with obesity hypoventilation syndrome.Marland Kitchen  PMH: nonocclusive CAD by cath in 2012, DM II, HTN, morbid obesity with obesity hypoventilation syndrome and OSA not on CPAP, D-CHF, near syncope with palpitations and PACs on monitor, low risk Myoview 01/2016, dry wt 270-273 lbs, vein incompetence contributing to LE edema, CVA, sz, chronic pancreatitis --Last seen by me in October 2018  [name] was last seen on July 08, 2017 by Rosaria Ferries, PA-C.  (As a follow-up from hospitalization in late December where she presented with a possible stroke type symptoms was thought to be a seizure during sleep).  She had some swelling and was diuresed with IV Lasix.  Was changed to alternating 40 mg a milligram every other day Lasix with metolazone. [name] noted that her breathing is doing okay, but was still sleeping propped up.  Noted that her weight is trended down to --> 264 pounds in the setting of being dehydrated.  Intended dry weight is roughly 275 pounds at home.  She talked about sodium restriction.  Made Lasix 80 mg on Monday and Friday with 40 mg on the other days.  Using metolazone as needed. Continued working on getting home oxygen and a sleep apnea machine-.  Apparently could not afford CPAP.  Recent Hospitalizations: ER/urgent care in March for bronchitis.  Was treated with antibiotics.  Studies Personally Reviewed - (if available, images/films reviewed: From Epic Chart or Care Everywhere)  Echo 06/05/2017: Mild concentric LVH.  EF 55-60% with no RWMA.  Grade  2 diastolic dysfunction.  Diastolic flattening of the ventricular septum suggestive of elevated pulmonary pressures.  Mild aortic valve calcification/sclerosis.  Moderate LA dilation.  Mild RV dilation and moderate RA dilation..  Interval History: [name] presents today for follow-up still noticing foot swelling and just is chronically disabled/fatigue.  Not able to do much of anything because of her leg braces etc.  She still has intermittent chest tightness and fluttering.  Her edema is up and down.  She has not been able to afford her CPAP machine.  She is trying to maintain her dry weight of around 275 pounds and is doing okay with the assistance of her sister.  She still sleeps sitting somewhat upright, but does not have any PND.  Edema has been relatively well controlled. Intermittent episodes of chest discomfort off and on associated with more anxiety or difficulty breathing then what was sound like angina.  No syncope or near syncope.  No TIA or amaurosis fugax. She really does not walk enough to no claudication.  Unfortunately she has little recourse because she lives alone when she has symptoms then to call EMS.  ROS: A comprehensive was performed. Review of Systems  Constitutional: Positive for malaise/fatigue (Just does not have energy.  Partly because she is so disabled.). Negative for weight loss (Is trying to maintain stable dry weight at home of roughly 273-275 pounds.).  HENT: Negative for congestion and nosebleeds.   Respiratory: Positive for shortness of breath. Negative for cough.   Gastrointestinal: Positive  for heartburn. Negative for blood in stool and melena.  Genitourinary: Negative for hematuria.  Musculoskeletal: Positive for falls (Off and on, no recent) and joint pain (Knees and ankles as well as most joints.).  Neurological: Positive for dizziness (Sometimes orthostatic in the morning.).  Psychiatric/Behavioral: Positive for depression (She does seem to have somewhat sad  affect and feels it dejected.). Negative for memory loss. The patient has insomnia.   All other systems reviewed and are negative.  I have reviewed and (if needed) personally updated the patient's problem list, medications, allergies, past medical and surgical history, social and family history.   Past Medical History:  Diagnosis Date  . Abnormal liver function     in the past.  . Anemia   . Arthritis    all over  . Bruises easily   . Cataract    right eye;immature  . CHF (congestive heart failure) ([place])   . Chronic back pain    stenosis  . Chronic cough   . Demand myocardial infarction [facility]) 2012   Demand Infarction in setting of Pancreatitis --> mild Troponin elevation, Non-obstructive CAD  . Depression    takes Abilify daily as well as Zoloft  . Diastolic heart failure 5956   Grade 1 diastolic Dysfunction by Echo   . Diverticulosis   . DM (diabetes mellitus) ([place])    takes Victoza daily as well as Lantus and Humalog  . Empty sella ([place])    on MRI in 2009.  . Glaucoma   . Headache(784.0)    last migraine-4-63yr ago  . History of blood transfusion    no abnormal reaction noted  . History of colon polyps    benign  . History of influenza    antibiotic given yesterday along with taking Delsym cough syrup  . HTN (hypertension)    takes BKinder Morgan Energydaily  . Hyperlipidemia    takes Lipitor daily  . Joint swelling   . Nocturia   . Obstructive sleep apnea   . Pancreatitis    takes Pancrelipase daily  . Parkinson's disease ([place]    takes Sinemet daily  . Peripheral edema    takes Lasix daily  . Peripheral neuropathy   . Pneumonia 2012  . Seizure disorder ([place]   . Seizures ([place]    takes Lamictal daily and Primidone nightly;last seizure 2wks ago  . Urinary frequency   . Urinary urgency   . Varicose veins of both lower extremities with pain     Past Surgical History:  Procedure Laterality Date  . ABDOMINAL HYSTERECTOMY    . APPENDECTOMY    .  BACK SURGERY    . CARDIAC CATHETERIZATION  2009/March 2012   2009: (Dr. KAlvie Heidelberg Nonobstructive CAD; 2012: Minimal CAD --> false positive stress test  . Cardiac Event Monitor  September-October 2017   Sinus rhythm with occasional PACs and artifact. No arrhythmias besides one short run of tachycardia.  . CHOLECYSTECTOMY    . COLONOSCOPY N/A 08/15/2012   Procedure: COLONOSCOPY;  Surgeon: WArta Silence MD;  Location: [facility];  Service: Endoscopy;  Laterality: N/A;  . COLONOSCOPY WITH PROPOFOL Left 06/17/2017   Procedure: COLONOSCOPY WITH PROPOFOL;  Surgeon: KRonnette Juniper MD;  [facility]: [facility]  Service: Gastroenterology;  Laterality: Left;  . ESOPHAGOGASTRODUODENOSCOPY    . eye cysts Bilateral   . LASIK    . NM MYOVIEW LTD  March 2012; Aug 2017   Probably normal Lexiscan Myoview. Mild reversible Anterior Defect ~ Breast Attenuation. (CRO ischemia):  FALSE POSITIVE  by Cath; b) 8/'17: LOW RISK, No ischemia or Infarction.  Breast Attenuation not noted.  Marland Kitchen NM MYOVIEW LTD  01/2016   LOW RISK. No ischemia or infarction.  Marland Kitchen RECTAL POLYPECTOMY    . TONSILLECTOMY    . TRANSTHORACIC ECHOCARDIOGRAM  01/2016; 05/2018   A) Normal LV chamber size with mod LVH pattern. EF 50-55%. Severe LA dilation. Mod RA dilation. PAP elevated at 38 mmHg;; B) Mild concentric LVH.  EF 55-60% & no RWMA.  Grade 2 DD.  Diastolic flattening of the ventricular septum == ? elevated PAP.  Mild aortic valve calcification/sclerosis.  Mod LA dilation.  Mild RV dilation & Mod RA dilation  . TUBAL LIGATION    . vaginal cyst removed     several times    Current Meds  Medication Sig  . acetaminophen (TYLENOL) 500 MG tablet Take 500-1,000 mg by mouth every 4 (four) hours as needed (for pain).   Marland Kitchen atorvastatin (LIPITOR) 10 MG tablet Take 10 mg by mouth daily.  . B Complex-C (B-COMPLEX WITH VITAMIN C) tablet Take 1 tablet by mouth daily.  . Blood Glucose Monitoring Suppl (ACCU-CHEK NANO SMARTVIEW) W/DEVICE KIT Use to check  blood sugar 3 times per day dx code E11.65  . carbidopa-levodopa (SINEMET IR) 25-100 MG per tablet Take 1 tablet by mouth 3 (three) times daily.   . carvedilol (COREG) 25 MG tablet Take 25 mg by mouth 2 (two) times daily with a meal.  . Cholecalciferol (VITAMIN D-3) 1000 units CAPS Take 1,000 Units by mouth daily.  . cholestyramine (QUESTRAN) 4 G packet Take 4 g by mouth 2 (two) times daily as needed (for diarrhea).   . furosemide (LASIX) 40 MG tablet Take 40 mg ( 1 tablet) by mouth on SUN,TUES,WED., THUR.,SAT. And take 80 mg th other days  . Insulin Glargine (TOUJEO SOLOSTAR) 300 UNIT/ML SOPN Inject 40 Units into the skin daily. (Patient taking differently: Inject 36 Units into the skin at bedtime. )  . Insulin Pen Needle 31G X 8 MM MISC Use to inject medication 4 times daily  . lamoTRIgine (LAMICTAL) 100 MG tablet Take 1 tablet (100 mg total) by mouth 2 (two) times daily.  . Menthol, Topical Analgesic, 3.5 % GEL Apply 1 application topically as needed (for neuropathy in feet).   . metolazone (ZAROXOLYN) 2.5 MG tablet Take 1 tablet (2.5 mg total) by mouth as directed.  . Multiple Vitamins-Minerals (MULTIVITAMIN WITH MINERALS) tablet Take 1 tablet by mouth daily.  Marland Kitchen OZEMPIC 0.25 or 0.5 MG/DOSE SOPN Inject as directed as directed. Marland Kitchen025 for 4 weeks and then do .05 for continuous  . Pancrelipase, Lip-Prot-Amyl, 25000 UNITS CPEP Take 25,000-50,000 Units by mouth See admin instructions. 50,000 units with breakfast then 25,000 units with lunch then 50,000 units with dinner (evening meal)  . potassium chloride SA (K-DUR,KLOR-CON) 20 MEQ tablet Take 2 tablets (40 mEq total) by mouth daily. On the day you take metolazone, take an extra 20 meq (60 meq total)  . predniSONE (DELTASONE) 20 MG tablet One daily with food  . pregabalin (LYRICA) 100 MG capsule Take 100 mg by mouth 2 (two) times daily.  . sertraline (ZOLOFT) 50 MG tablet Take 50 mg by mouth every evening.   . [DISCONTINUED] furosemide (LASIX) 40 MG  tablet Take 40 mg ( 1 tablet) by mouth on SUN,TUES,WED., THUR.,SAT. ONLY  . [DISCONTINUED] isosorbide mononitrate (IMDUR) 30 MG 24 hr tablet Take 1 tablet (30 mg total) by mouth daily.  . [DISCONTINUED] metolazone (ZAROXOLYN) 2.5 MG  tablet Take 2.5 mg by mouth as directed.    Allergies  Allergen Reactions  . Penicillins Hives    Has patient had a PCN reaction causing immediate rash, facial/tongue/throat swelling, SOB or lightheadedness with hypotension: Yes Has patient had a PCN reaction causing severe rash involving mucus membranes or skin necrosis: No Has patient had a PCN reaction that required hospitalization: No Has patient had a PCN reaction occurring within the last 10 years: No If all of the above answers are "NO", then may proceed with Cephalosporin use.   . Sulfa Antibiotics Hives  . Aspirin Other (See Comments)     On aspirin 81 mg - Rectal bleeding in dec 2018  . Codeine     Headache and makes the patient feel "off"  . Other Rash    [place]    Social History   Tobacco Use  . Smoking status: Former Smoker    Packs/day: 0.50    Years: 25.00    Pack years: 12.50    Types: Cigarettes  . Smokeless tobacco: Never Used  . Tobacco comment: quit smoking 20+ytrs ago  Substance Use Topics  . Alcohol use: No  . Drug use: No   Social History   Social History Narrative   She lives in [place]. She has lots of family in the area and is accompanied by her sister.   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   She has a distant history of smoking, quit 20 years ago.    family history includes Allergies in her father; Cancer in her other; Diabetes in her brother, mother, sister, and son; Heart disease in her father, mother, and sister; Heart failure in her father; Hyperlipidemia in her father, mother, and sister; Hypertension in her brother, father, mother, sister, sister, and son.  Wt Readings from Last 3 Encounters:  10/07/17 277 lb 12.8 oz  (126 kg)  07/30/17 271 lb (122.9 kg)  07/08/17 272 lb (123.4 kg)    PHYSICAL EXAM BP (!) 144/72   Pulse 60   Ht 5' 1"  (1.549 m)   Wt 277 lb 12.8 oz (126 kg)   BMI 52.49 kg/m  Physical Exam    Adult ECG Report  Rate: 60;  Rhythm: normal sinus rhythm and With some fusion complexes.; stable nonspecific ST-T wave changes.  Otherwise normal axis, intervals and durations.  Narrative Interpretation: Stable EKG   Other studies Reviewed: Additional studies/ records that were reviewed today include:  Recent Labs:    Lab Results  Component Value Date   CREATININE 1.13 (H) 07/19/2017   BUN 19 07/19/2017   NA 141 07/19/2017   K 4.5 07/19/2017   CL 97 07/19/2017   CO2 26 07/19/2017    ASSESSMENT / PLAN: Problem List Items Addressed This Visit    Varicose veins of bilateral lower extremities with other complications    Nonocclusive in the setting of having probable OSA and OHS as well as obesity and diastolic dysfunction, she also has some varicose veins that is related to venous stasis.  That is contributing to her edema. I recommend support stockings but probably would need to use the zipper kind if not just a simple lighter weight travel socks.  She would not be a put on the regular tighter stockings.      Relevant Medications   carvedilol (COREG) 25 MG tablet   metolazone (ZAROXOLYN) 2.5 MG tablet   furosemide (LASIX) 40 MG tablet   isosorbide mononitrate (IMDUR) 60 MG  24 hr tablet   OSA (obstructive sleep apnea) - not on CPAP (Chronic)    She seems to be somebody who would be better off seeing a female physician.  Sort of by Request I Will Therefore Refer Her to Dr. Brett Fairy from Neurology for OSA evaluation and treatment.  She sees Dr. Carles Collet will believe is also 1 of her colleagues. I really do think she needs to be on CPAP or even potentially BiPAP for what he probably has obesity hypoventilation syndrome.      Relevant Orders   Ambulatory referral to Neurology   Morbid  obesity with BMI of 50.0-59.9, adult ([place]) (Chronic)    I strongly recommended that she tries to do water aerobics.  This will be the only way for her to actually do exercise and avoid the joint pain issues.      Relevant Medications   OZEMPIC 0.25 or 0.5 MG/DOSE SOPN   Hypertensive heart disease (Chronic)    Blood pressure somewhat elevated today.  She had been on hydralazine in the past but that was stopped during 1 of her hospitalizations.  For now we will simply continue carvedilol at current dose but will increase Imdur to 60 mg.We may need to go back to low-dose hydralazine if her pressures are still somewhat elevated. Adjusted diuretic regimen as noted elsewhere. Needs OSA evaluation and start CPAP.      Relevant Medications   carvedilol (COREG) 25 MG tablet   metolazone (ZAROXOLYN) 2.5 MG tablet   furosemide (LASIX) 40 MG tablet   isosorbide mononitrate (IMDUR) 60 MG 24 hr tablet   Other Relevant Orders   EKG 12-Lead (Completed)   Coronary artery disease, non-occlusive (Chronic)    Nonocclusive CAD by cardiac cath and then negative Myoview in follow-up.  Her symptoms are probably not anginal in nature but more related to pulmonary hypertension and increased preload. Continue carvedilol but will increase Imdur to 60 mg for reduced preload.      Relevant Medications   carvedilol (COREG) 25 MG tablet   metolazone (ZAROXOLYN) 2.5 MG tablet   furosemide (LASIX) 40 MG tablet   isosorbide mononitrate (IMDUR) 60 MG 24 hr tablet   Chronic diastolic CHF (congestive heart failure), NYHA class 2 (HCC) - Primary (Chronic)    Really hypertensive heart disease and associated heart failure exacerbated by the fact that she has likely OHS with untreated OSA. Very susceptible to changes in hydration.  It is difficult to manage over time. I have adjusted her Lasix and metolazone intervals to where she will take 80 mg of Lasix on Plan: Continue current dose of carvedilol. 3 out of the 7 days a  week whereas she will take the metolazone 2.5 mg and 40 mg Lasix on the other days.  Her as needed change for weight change of 3 pounds gain would be 2.5 mg of metolazone +80 Lasix.  More than 5 pounds would be 5 mg metolazone and a bit of Lasix plus additional 40 mg in the afternoon. We will increase Imdur for additional preload reduction.       Relevant Medications   carvedilol (COREG) 25 MG tablet   metolazone (ZAROXOLYN) 2.5 MG tablet   furosemide (LASIX) 40 MG tablet   isosorbide mononitrate (IMDUR) 60 MG 24 hr tablet   Other Relevant Orders   EKG 12-Lead (Completed)      I spent a total of 40 minutes with the patient and chart review. >  50% of the time was spent in direct  patient consultation.   Current medicines are reviewed at length with the patient today.  (+/- concerns) still not sure how to take her meds The following changes have been made:  See below  Patient Instructions  MEDICATION INSTRUCTIONS  ISOSORBIDE MONO 60 MG - ONE TABLET DAILY  ( MAY TAKE 2 TABLETS OF 30 MG UNTIL BOTTLE IS COMPLETE)   Take 80 mg of lasix on the specified days the other days take 40 mg lasix  The days you take 40 mg take your metolazone 2.5 mg 30 min prior to the lasix .   DRY WEIGHT WILL 273 LBS -IF ABOVE THIS WEIGHT BY 3 LBS If weight is up by 3 lbs - take 2.5 mg of metolazone  30 min before taking 80 mg of lasix ( regards of what day it is?)     WEAR SUPPORT KNEE HI MAY PURCHASE AT [facility], PHARMACY, [facility]    EXERCISE -- OKAY TO WATER AEROBICS ELEVATED YOUR LEGS FOR 15 MINUTE AT A TIME FOR AT LEAST 3 TIMES A DAY , DURING THAT TIME MAY ROLLED DOWN YOUR SUPPORT KNEE HI   You have been referred to  DR [place]   .Your physician recommends that you schedule a follow-up appointment in [place] wants you to follow-up in [place]. You will receive a reminder letter in the mail two months in advance. If you  don't receive a letter, please call our office to schedule the follow-up appointment.  If you need a refill on your cardiac medications before your next appointment, please call your pharmacy.     Studies Ordered:   Orders Placed This Encounter  Procedures  . Ambulatory referral to Neurology  . EKG 12-Lead      Glenetta Hew, M.D., M.S. Interventional Cardiologist   Pager # [phone] Phone # [phone] [address] [place], [place] [address]   Thank you for choosing [facility] at [facility]!!

## 2017-10-07 NOTE — Patient Instructions (Addendum)
MEDICATION INSTRUCTIONS  ISOSORBIDE MONO 60 MG - ONE TABLET DAILY  ( MAY TAKE 2 TABLETS OF 30 MG UNTIL BOTTLE IS COMPLETE)   Take 80 mg of lasix on the specified days the other days take 40 mg lasix  The days you take 40 mg take your metolazone 2.5 mg 30 min prior to the lasix .   DRY WEIGHT WILL 273 LBS -IF ABOVE THIS WEIGHT BY 3 LBS If weight is up by 3 lbs - take 2.5 mg of metolazone  30 min before taking 80 mg of lasix ( regards of what day it is?)     WEAR SUPPORT KNEE HI MAY PURCHASE AT [facility], PHARMACY, [facility]    EXERCISE -- OKAY TO WATER AEROBICS ELEVATED YOUR LEGS FOR 15 MINUTE AT A TIME FOR AT LEAST 3 TIMES A DAY , DURING THAT TIME MAY ROLLED DOWN YOUR SUPPORT KNEE HI   You have been referred to  DR [place]   .Your physician recommends that you schedule a follow-up appointment in [place] wants you to follow-up in [place]. You will receive a reminder letter in the mail two months in advance. If you don't receive a letter, please call our office to schedule the follow-up appointment.  If you need a refill on your cardiac medications before your next appointment, please call your pharmacy.

## 2017-10-10 ENCOUNTER — Telehealth: Payer: Self-pay | Admitting: Cardiology

## 2017-10-10 ENCOUNTER — Encounter: Payer: Self-pay | Admitting: Cardiology

## 2017-10-10 NOTE — Assessment & Plan Note (Signed)
Blood pressure somewhat elevated today.  She had been on hydralazine in the past but that was stopped during 1 of her hospitalizations.  For now we will simply continue carvedilol at current dose but will increase Imdur to 60 mg.We may need to go back to low-dose hydralazine if her pressures are still somewhat elevated. Adjusted diuretic regimen as noted elsewhere. Needs OSA evaluation and start CPAP.

## 2017-10-10 NOTE — Telephone Encounter (Signed)
Routed to MD/RN Could not locate this in the note or AVS insturctions

## 2017-10-10 NOTE — Assessment & Plan Note (Signed)
She seems to be somebody who would be better off seeing a female physician.  Sort of by Request I Will Therefore Refer Her to Dr. Brett Fairy from Neurology for OSA evaluation and treatment.  She sees Dr. Carles Collet will believe is also 1 of her colleagues. I really do think she needs to be on CPAP or even potentially BiPAP for what he probably has obesity hypoventilation syndrome.

## 2017-10-10 NOTE — Assessment & Plan Note (Signed)
Really hypertensive heart disease and associated heart failure exacerbated by the fact that she has likely OHS with untreated OSA. Very susceptible to changes in hydration.  It is difficult to manage over time. I have adjusted her Lasix and metolazone intervals to where she will take 80 mg of Lasix on Plan: Continue current dose of carvedilol. 3 out of the 7 days a week whereas she will take the metolazone 2.5 mg and 40 mg Lasix on the other days.  Her as needed change for weight change of 3 pounds gain would be 2.5 mg of metolazone +80 Lasix.  More than 5 pounds would be 5 mg metolazone and a bit of Lasix plus additional 40 mg in the afternoon. We will increase Imdur for additional preload reduction.

## 2017-10-10 NOTE — Telephone Encounter (Signed)
New Message   Patient states that Dr. Ellyn Hack was to write her an prescription for a sturdy toilet. But she did not get the rx before she left. Please call to discuss.

## 2017-10-10 NOTE — Assessment & Plan Note (Signed)
I strongly recommended that she tries to do water aerobics.  This will be the only way for her to actually do exercise and avoid the joint pain issues.

## 2017-10-10 NOTE — Assessment & Plan Note (Signed)
Nonocclusive CAD by cardiac cath and then negative Myoview in follow-up.  Her symptoms are probably not anginal in nature but more related to pulmonary hypertension and increased preload. Continue carvedilol but will increase Imdur to 60 mg for reduced preload.

## 2017-10-10 NOTE — Assessment & Plan Note (Signed)
Nonocclusive in the setting of having probable OSA and OHS as well as obesity and diastolic dysfunction, she also has some varicose veins that is related to venous stasis.  That is contributing to her edema. I recommend support stockings but probably would need to use the zipper kind if not just a simple lighter weight travel socks.  She would not be a put on the regular tighter stockings.

## 2017-10-11 NOTE — Telephone Encounter (Signed)
Patient called and made aware that MD will address with his nurse next week.

## 2017-10-11 NOTE — Telephone Encounter (Signed)
Oh, that's right.  They asked me about that as I was finishing up -- got distracted & forgot to mention to [facility].  Let her know that I am not back in clinic til Tues & can try to figure it out then.  [name]

## 2017-10-15 ENCOUNTER — Telehealth: Payer: Self-pay | Admitting: Cardiology

## 2017-10-15 NOTE — Telephone Encounter (Signed)
Returned call to patient no answer.LMTC. 

## 2017-10-15 NOTE — Telephone Encounter (Signed)
New Message:      Pt is calling and states she has a horrible cough that she sometimes ends up spitting up mucous. Pt states she sometimes gets SOB with this cough. Pt would like to know if you can prescribe her something for this cough or will it be okay to take something over the counter.

## 2017-10-20 ENCOUNTER — Inpatient Hospital Stay (HOSPITAL_COMMUNITY)
Admission: EM | Admit: 2017-10-20 | Discharge: 2017-10-28 | DRG: 291 | Disposition: A | Discharge status: Skilled Nursing Facility | Payer: Medicare HMO | Attending: Internal Medicine | Admitting: Internal Medicine

## 2017-10-20 ENCOUNTER — Encounter (HOSPITAL_COMMUNITY): Payer: Self-pay | Admitting: Emergency Medicine

## 2017-10-20 ENCOUNTER — Other Ambulatory Visit: Payer: Self-pay

## 2017-10-20 ENCOUNTER — Emergency Department (HOSPITAL_COMMUNITY): Payer: Medicare HMO

## 2017-10-20 DIAGNOSIS — J9621 Acute and chronic respiratory failure with hypoxia: Secondary | ICD-10-CM | POA: Diagnosis present

## 2017-10-20 DIAGNOSIS — R0902 Hypoxemia: Secondary | ICD-10-CM | POA: Diagnosis not present

## 2017-10-20 DIAGNOSIS — M109 Gout, unspecified: Secondary | ICD-10-CM | POA: Diagnosis not present

## 2017-10-20 DIAGNOSIS — N183 Chronic kidney disease, stage 3 unspecified: Secondary | ICD-10-CM | POA: Diagnosis present

## 2017-10-20 DIAGNOSIS — E119 Type 2 diabetes mellitus without complications: Secondary | ICD-10-CM | POA: Diagnosis present

## 2017-10-20 DIAGNOSIS — M6281 Muscle weakness (generalized): Secondary | ICD-10-CM | POA: Diagnosis not present

## 2017-10-20 DIAGNOSIS — Z6841 Body Mass Index (BMI) 40.0 and over, adult: Secondary | ICD-10-CM

## 2017-10-20 DIAGNOSIS — E1142 Type 2 diabetes mellitus with diabetic polyneuropathy: Secondary | ICD-10-CM | POA: Diagnosis present

## 2017-10-20 DIAGNOSIS — Z880 Allergy status to penicillin: Secondary | ICD-10-CM

## 2017-10-20 DIAGNOSIS — R488 Other symbolic dysfunctions: Secondary | ICD-10-CM | POA: Diagnosis not present

## 2017-10-20 DIAGNOSIS — Z8249 Family history of ischemic heart disease and other diseases of the circulatory system: Secondary | ICD-10-CM

## 2017-10-20 DIAGNOSIS — J9811 Atelectasis: Secondary | ICD-10-CM | POA: Diagnosis present

## 2017-10-20 DIAGNOSIS — R262 Difficulty in walking, not elsewhere classified: Secondary | ICD-10-CM | POA: Diagnosis not present

## 2017-10-20 DIAGNOSIS — E785 Hyperlipidemia, unspecified: Secondary | ICD-10-CM | POA: Diagnosis present

## 2017-10-20 DIAGNOSIS — I5032 Chronic diastolic (congestive) heart failure: Secondary | ICD-10-CM | POA: Diagnosis not present

## 2017-10-20 DIAGNOSIS — R0602 Shortness of breath: Secondary | ICD-10-CM

## 2017-10-20 DIAGNOSIS — D509 Iron deficiency anemia, unspecified: Secondary | ICD-10-CM | POA: Diagnosis present

## 2017-10-20 DIAGNOSIS — E662 Morbid (severe) obesity with alveolar hypoventilation: Secondary | ICD-10-CM | POA: Diagnosis present

## 2017-10-20 DIAGNOSIS — J441 Chronic obstructive pulmonary disease with (acute) exacerbation: Secondary | ICD-10-CM | POA: Diagnosis present

## 2017-10-20 DIAGNOSIS — Z9114 Patient's other noncompliance with medication regimen: Secondary | ICD-10-CM | POA: Diagnosis not present

## 2017-10-20 DIAGNOSIS — R52 Pain, unspecified: Secondary | ICD-10-CM

## 2017-10-20 DIAGNOSIS — G4733 Obstructive sleep apnea (adult) (pediatric): Secondary | ICD-10-CM | POA: Diagnosis present

## 2017-10-20 DIAGNOSIS — R600 Localized edema: Secondary | ICD-10-CM | POA: Diagnosis present

## 2017-10-20 DIAGNOSIS — M1711 Unilateral primary osteoarthritis, right knee: Secondary | ICD-10-CM | POA: Diagnosis present

## 2017-10-20 DIAGNOSIS — Z833 Family history of diabetes mellitus: Secondary | ICD-10-CM

## 2017-10-20 DIAGNOSIS — F329 Major depressive disorder, single episode, unspecified: Secondary | ICD-10-CM | POA: Diagnosis present

## 2017-10-20 DIAGNOSIS — G20 Parkinson's disease: Secondary | ICD-10-CM | POA: Diagnosis present

## 2017-10-20 DIAGNOSIS — I509 Heart failure, unspecified: Secondary | ICD-10-CM

## 2017-10-20 DIAGNOSIS — G40909 Epilepsy, unspecified, not intractable, without status epilepticus: Secondary | ICD-10-CM | POA: Diagnosis present

## 2017-10-20 DIAGNOSIS — I4510 Unspecified right bundle-branch block: Secondary | ICD-10-CM | POA: Diagnosis present

## 2017-10-20 DIAGNOSIS — K861 Other chronic pancreatitis: Secondary | ICD-10-CM | POA: Diagnosis present

## 2017-10-20 DIAGNOSIS — Z87891 Personal history of nicotine dependence: Secondary | ICD-10-CM

## 2017-10-20 DIAGNOSIS — I110 Hypertensive heart disease with heart failure: Secondary | ICD-10-CM | POA: Diagnosis present

## 2017-10-20 DIAGNOSIS — I2510 Atherosclerotic heart disease of native coronary artery without angina pectoris: Secondary | ICD-10-CM | POA: Diagnosis not present

## 2017-10-20 DIAGNOSIS — I5033 Acute on chronic diastolic (congestive) heart failure: Secondary | ICD-10-CM | POA: Diagnosis present

## 2017-10-20 DIAGNOSIS — E1165 Type 2 diabetes mellitus with hyperglycemia: Secondary | ICD-10-CM | POA: Diagnosis present

## 2017-10-20 DIAGNOSIS — Z599 Problem related to housing and economic circumstances, unspecified: Secondary | ICD-10-CM | POA: Diagnosis not present

## 2017-10-20 DIAGNOSIS — R609 Edema, unspecified: Secondary | ICD-10-CM | POA: Diagnosis not present

## 2017-10-20 DIAGNOSIS — Z794 Long term (current) use of insulin: Secondary | ICD-10-CM

## 2017-10-20 DIAGNOSIS — I25118 Atherosclerotic heart disease of native coronary artery with other forms of angina pectoris: Secondary | ICD-10-CM | POA: Diagnosis present

## 2017-10-20 DIAGNOSIS — J9611 Chronic respiratory failure with hypoxia: Secondary | ICD-10-CM | POA: Diagnosis not present

## 2017-10-20 DIAGNOSIS — Z882 Allergy status to sulfonamides status: Secondary | ICD-10-CM | POA: Diagnosis not present

## 2017-10-20 DIAGNOSIS — R05 Cough: Secondary | ICD-10-CM | POA: Diagnosis not present

## 2017-10-20 HISTORY — DX: Chronic obstructive pulmonary disease, unspecified: J44.9

## 2017-10-20 HISTORY — DX: Chronic kidney disease, unspecified: N18.9

## 2017-10-20 LAB — BASIC METABOLIC PANEL
Anion gap: 10 (ref 5–15)
BUN: 21 mg/dL — ABNORMAL HIGH (ref 6–20)
CO2: 27 mmol/L (ref 22–32)
Calcium: 9 mg/dL (ref 8.9–10.3)
Chloride: 99 mmol/L — ABNORMAL LOW (ref 101–111)
Creatinine, Ser: 1.09 mg/dL — ABNORMAL HIGH (ref 0.44–1.00)
GFR calc Af Amer: 59 mL/min — ABNORMAL LOW (ref >60)
GFR calc non Af Amer: 51 mL/min — ABNORMAL LOW (ref >60)
Glucose, Bld: 126 mg/dL — ABNORMAL HIGH (ref 65–99)
Potassium: 4.2 mmol/L (ref 3.5–5.1)
Sodium: 136 mmol/L (ref 135–145)

## 2017-10-20 LAB — CBC
HCT: 37.9 % (ref 36.0–46.0)
Hemoglobin: 11.9 g/dL — ABNORMAL LOW (ref 12.0–15.0)
MCH: 23.1 pg — ABNORMAL LOW (ref 26.0–34.0)
MCHC: 31.4 g/dL (ref 30.0–36.0)
MCV: 73.4 fL — ABNORMAL LOW (ref 78.0–100.0)
Platelets: 192 10*3/uL (ref 150–400)
RBC: 5.16 MIL/uL — ABNORMAL HIGH (ref 3.87–5.11)
RDW: 17.9 % — ABNORMAL HIGH (ref 11.5–15.5)
WBC: 4.3 10*3/uL (ref 4.0–10.5)

## 2017-10-20 LAB — I-STAT TROPONIN, ED: Troponin i, poc: 0.01 ng/mL (ref 0.00–0.08)

## 2017-10-20 LAB — I-STAT CHEM 8, ED
BUN: 31 mg/dL — ABNORMAL HIGH (ref 6–20)
Calcium, Ion: 1.04 mmol/L — ABNORMAL LOW (ref 1.15–1.40)
Chloride: 101 mmol/L (ref 101–111)
Creatinine, Ser: 1.1 mg/dL — ABNORMAL HIGH (ref 0.44–1.00)
Glucose, Bld: 128 mg/dL — ABNORMAL HIGH (ref 65–99)
HCT: 41 % (ref 36.0–46.0)
Hemoglobin: 13.9 g/dL (ref 12.0–15.0)
Potassium: 5.5 mmol/L — ABNORMAL HIGH (ref 3.5–5.1)
Sodium: 136 mmol/L (ref 135–145)
TCO2: 30 mmol/L (ref 22–32)

## 2017-10-20 LAB — CBG MONITORING, ED: Glucose-Capillary: 89 mg/dL (ref 65–99)

## 2017-10-20 LAB — BRAIN NATRIURETIC PEPTIDE: B Natriuretic Peptide: 274.3 pg/mL — ABNORMAL HIGH (ref 0.0–100.0)

## 2017-10-20 MED ORDER — IPRATROPIUM-ALBUTEROL 0.5-2.5 (3) MG/3ML IN SOLN
3.0000 mL | Freq: Once | RESPIRATORY_TRACT | Status: AC
  Administered 2017-10-20: 3 mL via RESPIRATORY_TRACT
  Filled 2017-10-20: qty 3

## 2017-10-20 MED ORDER — ACETAMINOPHEN 325 MG PO TABS
650.0000 mg | ORAL_TABLET | Freq: Four times a day (QID) | ORAL | Status: DC | PRN
  Administered 2017-10-25: 650 mg via ORAL
  Filled 2017-10-20: qty 2

## 2017-10-20 MED ORDER — ALBUTEROL SULFATE (2.5 MG/3ML) 0.083% IN NEBU: 2.5000 mg | INHALATION_SOLUTION | RESPIRATORY_TRACT | Status: DC | PRN

## 2017-10-20 MED ORDER — ATORVASTATIN CALCIUM 10 MG PO TABS
10.0000 mg | ORAL_TABLET | Freq: Every day | ORAL | Status: DC
  Administered 2017-10-20 – 2017-10-28 (×9): 10 mg via ORAL
  Filled 2017-10-20 (×10): qty 1

## 2017-10-20 MED ORDER — VITAMIN D 1000 UNITS PO TABS
1000.0000 [IU] | ORAL_TABLET | Freq: Every day | ORAL | Status: DC
Start: 2017-10-20 — End: 2017-10-28
  Administered 2017-10-20 – 2017-10-28 (×9): 1000 [IU] via ORAL
  Filled 2017-10-20 (×10): qty 1

## 2017-10-20 MED ORDER — HEPARIN SODIUM (PORCINE) 5000 UNIT/ML IJ SOLN
5000.0000 [IU] | Freq: Three times a day (TID) | INTRAMUSCULAR | Status: DC
  Administered 2017-10-20 – 2017-10-28 (×24): 5000 [IU] via SUBCUTANEOUS
  Filled 2017-10-20 (×24): qty 1

## 2017-10-20 MED ORDER — INSULIN ASPART 100 UNIT/ML ~~LOC~~ SOLN
0.0000 [IU] | Freq: Three times a day (TID) | SUBCUTANEOUS | Status: DC
  Administered 2017-10-21 (×2): 2 [IU] via SUBCUTANEOUS
  Administered 2017-10-21: 1 [IU] via SUBCUTANEOUS
  Administered 2017-10-22 (×2): 2 [IU] via SUBCUTANEOUS
  Administered 2017-10-23: 1 [IU] via SUBCUTANEOUS
  Administered 2017-10-23 (×2): 2 [IU] via SUBCUTANEOUS
  Administered 2017-10-24 – 2017-10-26 (×5): 1 [IU] via SUBCUTANEOUS
  Administered 2017-10-26: 3 [IU] via SUBCUTANEOUS
  Administered 2017-10-27: 2 [IU] via SUBCUTANEOUS
  Administered 2017-10-27: 3 [IU] via SUBCUTANEOUS
  Administered 2017-10-28: 1 [IU] via SUBCUTANEOUS
  Administered 2017-10-28: 2 [IU] via SUBCUTANEOUS
  Filled 2017-10-20 (×2): qty 1

## 2017-10-20 MED ORDER — ISOSORBIDE MONONITRATE ER 60 MG PO TB24
60.0000 mg | ORAL_TABLET | Freq: Every day | ORAL | Status: DC
  Administered 2017-10-20 – 2017-10-28 (×9): 60 mg via ORAL
  Filled 2017-10-20: qty 2
  Filled 2017-10-20 (×8): qty 1

## 2017-10-20 MED ORDER — FAMOTIDINE IN NACL 20-0.9 MG/50ML-% IV SOLN
20.0000 mg | Freq: Two times a day (BID) | INTRAVENOUS | Status: DC
  Administered 2017-10-20 – 2017-10-21 (×2): 20 mg via INTRAVENOUS
  Filled 2017-10-20 (×2): qty 50

## 2017-10-20 MED ORDER — ONDANSETRON HCL 4 MG PO TABS
4.0000 mg | ORAL_TABLET | Freq: Four times a day (QID) | ORAL | Status: DC | PRN
  Administered 2017-10-28: 4 mg via ORAL
  Filled 2017-10-20: qty 1

## 2017-10-20 MED ORDER — SERTRALINE HCL 50 MG PO TABS
50.0000 mg | ORAL_TABLET | Freq: Every evening | ORAL | Status: DC
  Administered 2017-10-20 – 2017-10-27 (×8): 50 mg via ORAL
  Filled 2017-10-20 (×8): qty 1

## 2017-10-20 MED ORDER — FUROSEMIDE 10 MG/ML IJ SOLN
40.0000 mg | Freq: Once | INTRAMUSCULAR | Status: AC
  Administered 2017-10-20: 40 mg via INTRAVENOUS
  Filled 2017-10-20: qty 4

## 2017-10-20 MED ORDER — FUROSEMIDE 10 MG/ML IJ SOLN
40.0000 mg | Freq: Two times a day (BID) | INTRAMUSCULAR | Status: DC
  Administered 2017-10-20 – 2017-10-21 (×3): 40 mg via INTRAVENOUS
  Filled 2017-10-20 (×4): qty 4

## 2017-10-20 MED ORDER — CHOLESTYRAMINE 4 G PO PACK
4.0000 g | PACK | Freq: Two times a day (BID) | ORAL | Status: DC | PRN
  Filled 2017-10-20: qty 1

## 2017-10-20 MED ORDER — PANCRELIPASE (LIP-PROT-AMYL) 12000-38000 UNITS PO CPEP
48000.0000 [IU] | ORAL_CAPSULE | Freq: Two times a day (BID) | ORAL | Status: DC
  Administered 2017-10-20 – 2017-10-28 (×16): 48000 [IU] via ORAL
  Filled 2017-10-20 (×2): qty 4
  Filled 2017-10-20: qty 1
  Filled 2017-10-20 (×3): qty 4
  Filled 2017-10-20: qty 1
  Filled 2017-10-20 (×3): qty 4
  Filled 2017-10-20: qty 1
  Filled 2017-10-20 (×6): qty 4

## 2017-10-20 MED ORDER — PREGABALIN 25 MG PO CAPS
100.0000 mg | ORAL_CAPSULE | Freq: Two times a day (BID) | ORAL | Status: DC
  Administered 2017-10-20 – 2017-10-28 (×16): 100 mg via ORAL
  Filled 2017-10-20 (×16): qty 4

## 2017-10-20 MED ORDER — INSULIN GLARGINE 100 UNIT/ML ~~LOC~~ SOLN
30.0000 [IU] | Freq: Every day | SUBCUTANEOUS | Status: DC
  Administered 2017-10-21 – 2017-10-27 (×7): 30 [IU] via SUBCUTANEOUS
  Filled 2017-10-20 (×9): qty 0.3

## 2017-10-20 MED ORDER — LAMOTRIGINE 25 MG PO TABS
100.0000 mg | ORAL_TABLET | Freq: Two times a day (BID) | ORAL | Status: DC
  Administered 2017-10-20 – 2017-10-28 (×16): 100 mg via ORAL
  Filled 2017-10-20: qty 4
  Filled 2017-10-20: qty 1
  Filled 2017-10-20 (×8): qty 4
  Filled 2017-10-20: qty 1
  Filled 2017-10-20 (×7): qty 4

## 2017-10-20 MED ORDER — ONDANSETRON HCL 4 MG/2ML IJ SOLN: 4.0000 mg | Freq: Four times a day (QID) | INTRAMUSCULAR | Status: DC | PRN

## 2017-10-20 MED ORDER — METOLAZONE 5 MG PO TABS
2.5000 mg | ORAL_TABLET | Freq: Every day | ORAL | Status: DC | PRN
  Filled 2017-10-20: qty 1

## 2017-10-20 MED ORDER — CARVEDILOL 25 MG PO TABS
25.0000 mg | ORAL_TABLET | Freq: Two times a day (BID) | ORAL | Status: DC
  Administered 2017-10-20 – 2017-10-21 (×3): 25 mg via ORAL
  Filled 2017-10-20 (×3): qty 1

## 2017-10-20 MED ORDER — ACETAMINOPHEN 650 MG RE SUPP: 650.0000 mg | Freq: Four times a day (QID) | RECTAL | Status: DC | PRN

## 2017-10-20 MED ORDER — CARBIDOPA-LEVODOPA 25-100 MG PO TABS
1.0000 | ORAL_TABLET | Freq: Three times a day (TID) | ORAL | Status: DC
Start: 2017-10-20 — End: 2017-10-28
  Administered 2017-10-20 – 2017-10-28 (×23): 1 via ORAL
  Filled 2017-10-20 (×26): qty 1

## 2017-10-20 MED ORDER — PANCRELIPASE (LIP-PROT-AMYL) 12000-38000 UNITS PO CPEP
24000.0000 [IU] | ORAL_CAPSULE | Freq: Every day | ORAL | Status: DC
  Administered 2017-10-21 – 2017-10-28 (×8): 24000 [IU] via ORAL
  Filled 2017-10-20 (×9): qty 2

## 2017-10-20 MED ORDER — HYDROCODONE-ACETAMINOPHEN 5-325 MG PO TABS
1.0000 | ORAL_TABLET | ORAL | Status: DC | PRN
  Administered 2017-10-22 – 2017-10-26 (×9): 2 via ORAL
  Administered 2017-10-26: 1 via ORAL
  Administered 2017-10-27 (×2): 2 via ORAL
  Filled 2017-10-20 (×7): qty 2
  Filled 2017-10-20: qty 1
  Filled 2017-10-20 (×5): qty 2

## 2017-10-20 MED ORDER — PANCRELIPASE (LIP-PROT-AMYL) 25000 UNITS PO CPEP: 25000.0000 [IU] | ORAL_CAPSULE | ORAL | Status: DC

## 2017-10-20 NOTE — ED Notes (Signed)
Pt O2 sats in the lower 90's -upper 80's.  Placed pt on 2L [place].

## 2017-10-20 NOTE — ED Triage Notes (Signed)
Pt BIB EMS from home with c/o persistent cough that began around 8 am today.  Pt has a HX of CHF and DM.  Lung sounds reported clear by EMS.

## 2017-10-20 NOTE — Progress Notes (Signed)
PT assessed for CPAP.  Pt does not wear one at home due to financial reasons.  Pt is actively coughing up large amount of sputum.  No distress noted on 2l [place] with sats of 100%.  RT will monitor and assess at a later time.

## 2017-10-20 NOTE — ED Notes (Signed)
Pt states she was recently treated for bronchitis with antibiotics.

## 2017-10-20 NOTE — ED Notes (Signed)
Ordered dinner tray - carb mod/ heart healthy/ 122ml fluid

## 2017-10-20 NOTE — ED Notes (Signed)
Patient transported to X-ray 

## 2017-10-20 NOTE — ED Provider Notes (Signed)
[facility] EMERGENCY DEPARTMENT Provider Note   CSN: [id number] Arrival date & time: 10/20/17  1036     History   Chief Complaint Chief Complaint  Patient presents with  . Cough  . Chest Pain    HPI [name] is a 72 y.o. female.  With a history of CHF, obesity hypoventilation syndrome, renal insufficiency who presents the emergency department for cough and shortness of breath.  She is attended by her sister who helps with the history.  Patient states that this morning she was bathing she had sudden onset of coughing.  It is productive of clear sputum.  Her paroxysms of cough lasted for almost 2-hour before her sister called EMS.  She was coughing to the point of gagging.  She felt like her throat was closing.  She denies any oral swelling or wheezing.  She denies a history of asthma but does state that she wheezes sometimes.  She has noticed significantly worse swelling in the lower extremities.  Her swelling seems to be worse on the left.  Patient was found to be hypoxic by EMS and placed on nasal cannula.  Her sister states that she is supposed to have oxygen at home however she called home health and had them remove it because she could not afford it.  She also is supposed to be on a CPAP machine but could not afford that as well.  Patient states that she had bronchitis about 3 weeks ago but has gotten better has not had a persistent cough.  HPI  Past Medical History:  Diagnosis Date  . Abnormal liver function     in the past.  . Anemia   . Arthritis    all over  . Bruises easily   . Cataract    right eye;immature  . CHF (congestive heart failure) ([place])   . Chronic back pain    stenosis  . Chronic cough   . Chronic kidney disease   . COPD (chronic obstructive pulmonary disease) ([place])   . Demand myocardial infarction [facility]) 2012   Demand Infarction in setting of Pancreatitis --> mild Troponin elevation, Non-obstructive CAD  . Depression    takes  Abilify daily as well as Zoloft  . Diastolic heart failure 0347   Grade 1 diastolic Dysfunction by Echo   . Diverticulosis   . DM (diabetes mellitus) ([place])    takes Victoza daily as well as Lantus and Humalog  . Dyspnea   . Empty sella ([place])    on MRI in 2009.  . Glaucoma   . Headache(784.0)    last migraine-4-29yr ago  . History of blood transfusion    no abnormal reaction noted  . History of colon polyps    benign  . History of influenza    antibiotic given yesterday along with taking Delsym cough syrup  . HTN (hypertension)    takes BKinder Morgan Energydaily  . Hyperlipidemia    takes Lipitor daily  . Joint swelling   . Nocturia   . Obstructive sleep apnea   . Pancreatitis    takes Pancrelipase daily  . Parkinson's disease ([place]    takes Sinemet daily  . Peripheral edema    takes Lasix daily  . Peripheral neuropathy   . Pneumonia 2012  . Seizure disorder ([place]   . Seizures ([place]    takes Lamictal daily and Primidone nightly;last seizure 2wks ago  . Urinary frequency   . Urinary urgency   . Varicose veins of  both lower extremities with pain     Patient Active Problem List   Diagnosis Date Noted  . CHF exacerbation ([place]) 10/20/2017  . OSA (obstructive sleep apnea) - not on CPAP 10/07/2017  . Todd's paralysis ([place])   . Chronic diastolic CHF (congestive heart failure), NYHA class 2 ([place])   . Acute on chronic respiratory failure with hypoxia ([place])   . Acute lower GI bleeding   . Stroke-like symptoms 06/15/2017  . Rectal bleeding 06/15/2017  . Dehydration 03/07/2017  . Medication management 12/24/2016  . Pre-syncope 03/09/2016  . Racing heart beat 03/09/2016  . Spinal stenosis at L4-L5 level 10/04/2014  . Lower abdominal pain 10/31/2012  . Type II diabetes mellitus, uncontrolled ([place]) 10/31/2012  . Seizure disorder ([place]) 10/31/2012  . Diverticulosis 10/31/2012  . Anemia associated with acute blood loss 08/14/2012  . Hematochezia 08/14/2012  . Varicose  veins of bilateral lower extremities with other complications 34/19/3790  . Leg pain 03/21/2012  . Radicular pain of left lower extremity 03/21/2012  . Infected pseudocyst of pancreas 10/28/2011  . Chronic pancreatitis ([place]) 10/28/2011  . Hypertensive heart disease 10/28/2011  . Chronic respiratory failure ([place]) 10/28/2011  . Dyslipidemia, goal LDL below 100 10/28/2011  . Morbid obesity with BMI of 50.0-59.9, adult ([place]) 10/28/2011  . Peripheral edema 04/14/2011  . Obesity hypoventilation syndrome ([place]) 10/26/2010  . Coronary artery disease, non-occlusive 08/28/2010    Past Surgical History:  Procedure Laterality Date  . ABDOMINAL HYSTERECTOMY    . APPENDECTOMY    . BACK SURGERY    . CARDIAC CATHETERIZATION  2009/March 2012   2009: (Dr. Alvie Heidelberg) Nonobstructive CAD; 2012: Minimal CAD --> false positive stress test  . Cardiac Event Monitor  September-October 2017   Sinus rhythm with occasional PACs and artifact. No arrhythmias besides one short run of tachycardia.  . CHOLECYSTECTOMY    . COLONOSCOPY N/A 08/15/2012   Procedure: COLONOSCOPY;  Surgeon: Arta Silence, MD;  Location: [facility];  Service: Endoscopy;  Laterality: N/A;  . COLONOSCOPY WITH PROPOFOL Left 06/17/2017   Procedure: COLONOSCOPY WITH PROPOFOL;  Surgeon: [name], MD;  Location: [facility];  Service: Gastroenterology;  Laterality: Left;  . ESOPHAGOGASTRODUODENOSCOPY    . eye cysts Bilateral   . LASIK    . NM MYOVIEW LTD  March 2012; Aug 2017   Probably normal Lexiscan Myoview. Mild reversible Anterior Defect ~ Breast Attenuation. (CRO ischemia):  FALSE POSITIVE by Cath; b) 8/'17: LOW RISK, No ischemia or Infarction.  Breast Attenuation not noted.  Marland Kitchen NM MYOVIEW LTD  01/2016   LOW RISK. No ischemia or infarction.  Marland Kitchen RECTAL POLYPECTOMY    . TONSILLECTOMY    . TRANSTHORACIC ECHOCARDIOGRAM  01/2016; 05/2018   A) Normal LV chamber size with mod LVH pattern. EF 50-55%. Severe LA dilation. Mod RA dilation. PAP  elevated at 38 mmHg;; B) Mild concentric LVH.  EF 55-60% & no RWMA.  Grade 2 DD.  Diastolic flattening of the ventricular septum == ? elevated PAP.  Mild aortic valve calcification/sclerosis.  Mod LA dilation.  Mild RV dilation & Mod RA dilation  . TUBAL LIGATION    . vaginal cyst removed     several times     OB History   None      Home Medications    Prior to Admission medications   Medication Sig Start Date End Date Taking? Authorizing Provider  acetaminophen (TYLENOL) 500 MG tablet Take 500-1,000 mg by mouth every 4 (four) hours as needed (for pain).  Yes [provider]  atorvastatin (LIPITOR) 10 MG tablet Take 10 mg by mouth daily.   Yes [provider]  B Complex-C (B-COMPLEX WITH VITAMIN C) tablet Take 1 tablet by mouth daily.   Yes [provider]  Blood Glucose Monitoring Suppl (ACCU-CHEK NANO SMARTVIEW) W/DEVICE KIT Use to check blood sugar 3 times per day dx code E11.65 07/19/14  Yes Elayne Snare, MD  carbidopa-levodopa (SINEMET IR) 25-100 MG per tablet Take 1 tablet by mouth 3 (three) times daily.  02/11/13  Yes [provider]  carvedilol (COREG) 25 MG tablet Take 25 mg by mouth 2 (two) times daily with a meal.   Yes [provider]  Cholecalciferol (VITAMIN D-3) 1000 units CAPS Take 1,000 Units by mouth daily.   Yes [provider]  cholestyramine Lucrezia Starch) 4 G packet Take 4 g by mouth 2 (two) times daily as needed (for diarrhea).    Yes [provider]  furosemide (LASIX) 40 MG tablet Take 40 mg ( 1 tablet) by mouth on SUN,TUES,WED., THUR.,SAT. And take 80 mg th other days Patient taking differently: Take 40-80 mg by mouth See admin instructions. 82m by mouth on SUN, TUE, TFrio,SAT-80 mg MON, WED, FRI 10/07/17  Yes HLeonie Man MD  Insulin Glargine (TOUJEO SOLOSTAR) 300 UNIT/ML SOPN Inject 40 Units into the skin daily. Patient taking differently: Inject 30 Units into the skin at bedtime.  04/17/16  Yes  KElayne Snare MD  Insulin Pen Needle 31G X 8 MM MISC Use to inject medication 4 times daily 12/22/15  Yes KElayne Snare MD  isosorbide mononitrate (IMDUR) 60 MG 24 hr tablet Take 1 tablet (60 mg total) by mouth daily. 10/07/17 01/05/18 Yes HLeonie Man MD  lamoTRIgine (LAMICTAL) 100 MG tablet Take 1 tablet (100 mg total) by mouth 2 (two) times daily. 06/18/17  Yes RDebbe Odea MD  Menthol, Topical Analgesic, 3.5 % GEL Apply 1 application topically as needed (for neuropathy in feet).    Yes [provider]  metolazone (ZAROXOLYN) 2.5 MG tablet Take 1 tablet (2.5 mg total) by mouth as directed. Patient taking differently: Take 2.5 mg by mouth daily as needed (fluid retention).  10/07/17  Yes HLeonie Man MD  Multiple Vitamins-Minerals (MULTIVITAMIN WITH MINERALS) tablet Take 1 tablet by mouth daily.   Yes [provider]  OZEMPIC 0.25 or 0.5 MG/DOSE SOPN Inject 0.5 mg as directed once a week. .Marland Kitchen25 for 4 weeks and then do .05 for continuous 09/26/17  Yes [provider]  Pancrelipase, Lip-Prot-Amyl, 25000 UNITS CPEP Take 25,000-50,000 Units by mouth See admin instructions. 50,000 units with breakfast then 25,000 units with lunch then 50,000 units with dinner (evening meal)   Yes [provider]  potassium chloride SA (K-DUR,KLOR-CON) 20 MEQ tablet Take 2 tablets (40 mEq total) by mouth daily. On the day you take metolazone, take an extra 20 meq (60 meq total) 07/03/17  Yes HLeonie Man MD  pregabalin (LYRICA) 100 MG capsule Take 100 mg by mouth 2 (two) times daily.   Yes [provider]  sertraline (ZOLOFT) 50 MG tablet Take 50 mg by mouth every evening.    Yes [provider]  predniSONE (DELTASONE) 20 MG tablet One daily with food Patient not taking: Reported on 10/20/2017 09/14/17   LRobyn Haber MD    Family History Family History  Problem Relation Age of Onset  . Allergies Father   . Heart disease Father        before  16  . Heart  failure Father   . Hypertension Father   . Hyperlipidemia Father   . Heart disease Mother   . Diabetes Mother   . Hypertension Mother   . Hyperlipidemia Mother   . Hypertension Sister   . Heart disease Sister        before 32  . Hyperlipidemia Sister   . Diabetes Brother   . Hypertension Brother   . Diabetes Sister   . Hypertension Sister   . Diabetes Son   . Hypertension Son   . Cancer Other     Social History Social History   Tobacco Use  . Smoking status: Former Smoker    Packs/day: 0.50    Years: 25.00    Pack years: 12.50    Types: Cigarettes  . Smokeless tobacco: Never Used  . Tobacco comment: quit smoking 20+ytrs ago  Substance Use Topics  . Alcohol use: No  . Drug use: No     Allergies   Penicillins; Sulfa antibiotics; Aspirin; Codeine; and Other   Review of Systems Review of Systems  Ten systems reviewed and are negative for acute change, except as noted in the HPI.   Physical Exam Updated Vital Signs BP (!) 146/73   Pulse (!) 59   Temp 97.7 F (36.5 C) (Oral)   Resp 17   SpO2 97%   Physical Exam  Constitutional: She is oriented to person, place, and time. She appears well-developed and well-nourished. No distress.  HENT:  Head: Normocephalic and atraumatic.  Eyes: EOM are normal.  Neck: Neck supple.  Unable to assess JVD secondary to body habitus  Cardiovascular: Normal rate, regular rhythm and normal pulses.  No murmur heard. Pulmonary/Chest: She has decreased breath sounds. She has wheezes. She has rales in the right middle field and the left middle field.  Abdominal: Soft. Bowel sounds are normal.  Musculoskeletal:       Right lower leg: She exhibits edema.       Left lower leg: She exhibits edema.  L>R pitting edema  Neurological: She is alert and oriented to person, place, and time.  Skin: Skin is warm and dry. Capillary refill takes less than 2 seconds.  Psychiatric: Her behavior is normal.  Nursing note and vitals  reviewed.    ED Treatments / Results  Labs (all labs ordered are listed, but only abnormal results are displayed) Labs Reviewed  BASIC METABOLIC PANEL - Abnormal; Notable for the following components:      Result Value   Chloride 99 (*)    Glucose, Bld 126 (*)    BUN 21 (*)    Creatinine, Ser 1.09 (*)    GFR calc non Af Amer 51 (*)    GFR calc Af Amer 59 (*)    All other components within normal limits  BRAIN NATRIURETIC PEPTIDE - Abnormal; Notable for the following components:   B Natriuretic Peptide 274.3 (*)    All other components within normal limits  CBC - Abnormal; Notable for the following components:   RBC 5.16 (*)    Hemoglobin 11.9 (*)    MCV 73.4 (*)    MCH 23.1 (*)    RDW 17.9 (*)    All other components within normal limits  I-STAT CHEM 8, ED - Abnormal; Notable for the following components:   Potassium 5.5 (*)    BUN 31 (*)    Creatinine, Ser 1.10 (*)    Glucose, Bld 128 (*)    Calcium, Ion 1.04 (*)  All other components within normal limits  I-STAT TROPONIN, ED    EKG EKG Interpretation  Date/Time:  Sunday October 20 2017 10:53:05 EDT Ventricular Rate:  64 PR Interval:    QRS Duration: 131 QT Interval:  422 QTC Calculation: 436 R Axis:   104 Text Interpretation:  Sinus rhythm Atrial premature complex Right bundle branch block No significant change since last tracing Confirmed by Duffy Bruce [phone]) on 10/20/2017 12:39:27 PM   Radiology Dg Chest 2 View  Result Date: 10/20/2017 CLINICAL DATA:  Productive cough EXAM: CHEST - 2 VIEW COMPARISON:  06/23/2017 FINDINGS: The heart is moderately enlarged. Low lung volumes. Bibasilar atelectasis. No pneumothorax or pleural effusion. No interstitial edema. IMPRESSION: Cardiomegaly Bibasilar atelectasis. Electronically Signed   By: Marybelle Killings M.D.   On: 10/20/2017 12:24    Procedures Procedures (including critical care time)  Medications Ordered in ED Medications  ipratropium-albuterol (DUONEB)  0.5-2.5 (3) MG/3ML nebulizer solution 3 mL (3 mLs Nebulization Given 10/20/17 1334)  furosemide (LASIX) injection 40 mg (40 mg Intravenous Given 10/20/17 1335)     Initial Impression / Assessment and Plan / ED Course  I have reviewed the triage vital signs and the nursing notes.  Pertinent labs & imaging results that were available during my care of the patient were reviewed by me and considered in my medical decision making (see chart for details).  Clinical Course as of Oct 21 1631  Sun Oct 20, 2017  1359 EKG reviewed no apparent changes or suggestion of ischemia   [AH]  1400 Chest x-ray reviewed by me.  It is read that there is no evidence of pulmonary edema however her clinical appearance suggests that there is fluid overload and volume in her lungs.  She does have some crackles in the mid lung fields.  DG Chest 2 View [AH]    Clinical Course User Index [AH] Margarita Mail, PA-C     Patient with apparent pulmonary edema . Treated with iv lasix and Duoneb with improvement. + hypoxia with coughing. Will need admission.  Final Clinical Impressions(s) / ED Diagnoses   Final diagnoses:  Acute on chronic congestive heart failure, unspecified heart failure type [facility])    ED Discharge Orders    None       Margarita Mail, PA-C 10/20/17 1633    Duffy Bruce, MD 10/21/17 1336

## 2017-10-20 NOTE — H&P (Addendum)
History and Physical    [name] ALP:[id number] DOB: [birth year] DOA: 10/20/2017   PCP: [name], MD   Patient coming from:  Home    Chief Complaint: Shortness of breath   HPI: [name] is a 72 y.o. female with medical history significant for hypertension, CHF, obesity hypoventilation syndrome, renal insufficiency, recent bronchitis about 3 weeks ago, presenting to the emergency department, with increasing shortness of breath and cough since this morning, when she was bathing, accompanied by clear sputum cough and increased lower extremity swelling left greater than right.  The patient waited about 2 hours before calling her sister to call EMS.  She was coughing at the point of gagging.  She felt her her throat was closing.  She denies any wheezing, although she reports that her sister does "hear her wheezing at times ".  The patient is not always compliant with her medications, as she is unable to afford them. She does admit to taking them last night.  EMS she was found to be hypoxic, and was placed on nasal cannula, with good response.  Of note, her sister states the patient is supposed to be on oxygen and on CPAP at home, but she is unable to afford them either.  ED Course:  BP (!) 146/73   Pulse (!) 59   Temp 97.7 F (36.5 C) (Oral)   Resp 17   SpO2 97%   BNP 274.3 Troponin  0.01 EKG shows sinus rhythm APC right bundle branch block, without acute changes.  Last 2D echo December 2018 EF 55 to 35%, grade 2 diastolic, normal systolic  Creatinine 3.29, potassium 5.5, sodium 136 Hemoglobin 11.9, white count 4.3, platelets 192 Chest x-ray shows fluid overload, cardiomegaly, no infiltrates Received DuoNeb nebulizer, with improvement of her wheezing Is a received Lasix 40 mg IV x1, with significant urine production Glucose 128  Review of Systems:  As per HPI otherwise all other systems reviewed and are negative  Past Medical History:  Diagnosis Date  . Abnormal  liver function     in the past.  . Anemia   . Arthritis    all over  . Bruises easily   . Cataract    right eye;immature  . CHF (congestive heart failure) ([place])   . Chronic back pain    stenosis  . Chronic cough   . Demand myocardial infarction [facility]) 2012   Demand Infarction in setting of Pancreatitis --> mild Troponin elevation, Non-obstructive CAD  . Depression    takes Abilify daily as well as Zoloft  . Diastolic heart failure 9242   Grade 1 diastolic Dysfunction by Echo   . Diverticulosis   . DM (diabetes mellitus) ([place])    takes Victoza daily as well as Lantus and Humalog  . Empty sella ([place])    on MRI in 2009.  . Glaucoma   . Headache(784.0)    last migraine-4-48yr ago  . History of blood transfusion    no abnormal reaction noted  . History of colon polyps    benign  . History of influenza    antibiotic given yesterday along with taking Delsym cough syrup  . HTN (hypertension)    takes BKinder Morgan Energydaily  . Hyperlipidemia    takes Lipitor daily  . Joint swelling   . Nocturia   . Obstructive sleep apnea   . Pancreatitis    takes Pancrelipase daily  . Parkinson's disease ([place]    takes Sinemet daily  . Peripheral edema  takes Lasix daily  . Peripheral neuropathy   . Pneumonia 2012  . Seizure disorder ([place])   . Seizures ([place])    takes Lamictal daily and Primidone nightly;last seizure 2wks ago  . Urinary frequency   . Urinary urgency   . Varicose veins of both lower extremities with pain     Past Surgical History:  Procedure Laterality Date  . ABDOMINAL HYSTERECTOMY    . APPENDECTOMY    . BACK SURGERY    . CARDIAC CATHETERIZATION  2009/March 2012   2009: (Dr. Alvie Heidelberg) Nonobstructive CAD; 2012: Minimal CAD --> false positive stress test  . Cardiac Event Monitor  September-October 2017   Sinus rhythm with occasional PACs and artifact. No arrhythmias besides one short run of tachycardia.  . CHOLECYSTECTOMY    . COLONOSCOPY N/A 08/15/2012     Procedure: COLONOSCOPY;  Surgeon: Arta Silence, MD;  Location: [facility];  Service: Endoscopy;  Laterality: N/A;  . COLONOSCOPY WITH PROPOFOL Left 06/17/2017   Procedure: COLONOSCOPY WITH PROPOFOL;  Surgeon: [name], MD;  Location: [place];  Service: Gastroenterology;  Laterality: Left;  . ESOPHAGOGASTRODUODENOSCOPY    . eye cysts Bilateral   . LASIK    . NM MYOVIEW LTD  March 2012; Aug 2017   Probably normal Lexiscan Myoview. Mild reversible Anterior Defect ~ Breast Attenuation. (CRO ischemia):  FALSE POSITIVE by Cath; b) 8/'17: LOW RISK, No ischemia or Infarction.  Breast Attenuation not noted.  Marland Kitchen NM MYOVIEW LTD  01/2016   LOW RISK. No ischemia or infarction.  Marland Kitchen RECTAL POLYPECTOMY    . TONSILLECTOMY    . TRANSTHORACIC ECHOCARDIOGRAM  01/2016; 05/2018   A) Normal LV chamber size with mod LVH pattern. EF 50-55%. Severe LA dilation. Mod RA dilation. PAP elevated at 38 mmHg;; B) Mild concentric LVH.  EF 55-60% & no RWMA.  Grade 2 DD.  Diastolic flattening of the ventricular septum == ? elevated PAP.  Mild aortic valve calcification/sclerosis.  Mod LA dilation.  Mild RV dilation & Mod RA dilation  . TUBAL LIGATION    . vaginal cyst removed     several times    Social History Social History   Socioeconomic History  . Marital status: Divorced    Spouse name: Not on file  . Number of children: Not on file  . Years of education: Not on file  . Highest education level: Not on file  Occupational History  . Occupation: Disabled    Comment: CNA  Social Needs  . Financial resource strain: Not on file  . Food insecurity:    Worry: Not on file    Inability: Not on file  . Transportation needs:    Medical: Not on file    Non-medical: Not on file  Tobacco Use  . Smoking status: Former Smoker    Packs/day: 0.50    Years: 25.00    Pack years: 12.50    Types: Cigarettes  . Smokeless tobacco: Never Used  . Tobacco comment: quit smoking 20+ytrs ago  Substance and Sexual  Activity  . Alcohol use: No  . Drug use: No  . Sexual activity: Never    Birth control/protection: Surgical  Lifestyle  . Physical activity:    Days per week: Not on file    Minutes per session: Not on file  . Stress: Not on file  Relationships  . Social connections:    Talks on phone: Not on file    Gets together: Not on file    Attends religious service:  Not on file    Active member of club or organization: Not on file    Attends meetings of clubs or organizations: Not on file    Relationship status: Not on file  . Intimate partner violence:    Fear of current or ex partner: Not on file    Emotionally abused: Not on file    Physically abused: Not on file    Forced sexual activity: Not on file  Other Topics Concern  . Not on file  Social History Narrative   She lives in [place]. She has lots of family in the area and is accompanied by her sister.   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   She has a distant history of smoking, quit 20 years ago.     Allergies  Allergen Reactions  . Penicillins Hives    Has patient had a PCN reaction causing immediate rash, facial/tongue/throat swelling, SOB or lightheadedness with hypotension: Yes Has patient had a PCN reaction causing severe rash involving mucus membranes or skin necrosis: No Has patient had a PCN reaction that required hospitalization: No Has patient had a PCN reaction occurring within the last 10 years: No If all of the above answers are "NO", then may proceed with Cephalosporin use.   . Sulfa Antibiotics Hives  . Aspirin Other (See Comments)     On aspirin 81 mg - Rectal bleeding in dec 2018  . Codeine     Headache and makes the patient feel "off"  . Other Rash    Bleach    Family History  Problem Relation Age of Onset  . Allergies Father   . Heart disease Father        before 29  . Heart failure Father   . Hypertension Father   . Hyperlipidemia Father   . Heart  disease Mother   . Diabetes Mother   . Hypertension Mother   . Hyperlipidemia Mother   . Hypertension Sister   . Heart disease Sister        before 70  . Hyperlipidemia Sister   . Diabetes Brother   . Hypertension Brother   . Diabetes Sister   . Hypertension Sister   . Diabetes Son   . Hypertension Son   . Cancer Other       Prior to Admission medications   Medication Sig Start Date End Date Taking? Authorizing Provider  acetaminophen (TYLENOL) 500 MG tablet Take 500-1,000 mg by mouth every 4 (four) hours as needed (for pain).    Yes [provider]  atorvastatin (LIPITOR) 10 MG tablet Take 10 mg by mouth daily.   Yes [provider]  B Complex-C (B-COMPLEX WITH VITAMIN C) tablet Take 1 tablet by mouth daily.   Yes [provider]  Blood Glucose Monitoring Suppl (ACCU-CHEK NANO SMARTVIEW) W/DEVICE KIT Use to check blood sugar 3 times per day dx code E11.65 07/19/14  Yes Elayne Snare, MD  carbidopa-levodopa (SINEMET IR) 25-100 MG per tablet Take 1 tablet by mouth 3 (three) times daily.  02/11/13  Yes [provider]  carvedilol (COREG) 25 MG tablet Take 25 mg by mouth 2 (two) times daily with a meal.   Yes [provider]  Cholecalciferol (VITAMIN D-3) 1000 units CAPS Take 1,000 Units by mouth daily.   Yes [provider]  cholestyramine Lucrezia Starch) 4 G packet Take 4 g by mouth 2 (two) times daily as needed (for diarrhea).    Yes [provider]  furosemide (LASIX) 40 MG tablet Take 40 mg ( 1 tablet) by mouth on SUN,TUES,WED., THUR.,SAT. And take 80 mg th other days Patient taking differently: Take 40-80 mg by mouth See admin instructions. 3m by mouth on SUN, TUE, TBlain,SAT-80 mg MON, WED, FRI 10/07/17  Yes HLeonie Man MD  Insulin Glargine (TOUJEO SOLOSTAR) 300 UNIT/ML SOPN Inject 40 Units into the skin daily. Patient taking differently: Inject 30 Units into the skin at bedtime.  04/17/16  Yes KElayne Snare MD  Insulin  Pen Needle 31G X 8 MM MISC Use to inject medication 4 times daily 12/22/15  Yes KElayne Snare MD  isosorbide mononitrate (IMDUR) 60 MG 24 hr tablet Take 1 tablet (60 mg total) by mouth daily. 10/07/17 01/05/18 Yes HLeonie Man MD  lamoTRIgine (LAMICTAL) 100 MG tablet Take 1 tablet (100 mg total) by mouth 2 (two) times daily. 06/18/17  Yes RDebbe Odea MD  Menthol, Topical Analgesic, 3.5 % GEL Apply 1 application topically as needed (for neuropathy in feet).    Yes [provider]  metolazone (ZAROXOLYN) 2.5 MG tablet Take 1 tablet (2.5 mg total) by mouth as directed. Patient taking differently: Take 2.5 mg by mouth daily as needed (fluid retention).  10/07/17  Yes HLeonie Man MD  Multiple Vitamins-Minerals (MULTIVITAMIN WITH MINERALS) tablet Take 1 tablet by mouth daily.   Yes [provider]  OZEMPIC 0.25 or 0.5 MG/DOSE SOPN Inject 0.5 mg as directed once a week. .Marland Kitchen25 for 4 weeks and then do .05 for continuous 09/26/17  Yes [provider]  Pancrelipase, Lip-Prot-Amyl, 25000 UNITS CPEP Take 25,000-50,000 Units by mouth See admin instructions. 50,000 units with breakfast then 25,000 units with lunch then 50,000 units with dinner (evening meal)   Yes [provider]  potassium chloride SA (K-DUR,KLOR-CON) 20 MEQ tablet Take 2 tablets (40 mEq total) by mouth daily. On the day you take metolazone, take an extra 20 meq (60 meq total) 07/03/17  Yes HLeonie Man MD  pregabalin (LYRICA) 100 MG capsule Take 100 mg by mouth 2 (two) times daily.   Yes [provider]  sertraline (ZOLOFT) 50 MG tablet Take 50 mg by mouth every evening.    Yes [provider]  predniSONE (DELTASONE) 20 MG tablet One daily with food Patient not taking: Reported on 10/20/2017 09/14/17   LRobyn Haber MD    Physical Exam:  Vitals:   10/20/17 1515 10/20/17 1530 10/20/17 1545 10/20/17 1600  BP: (!) 147/69 140/63 (!) 131/59 (!) 146/73  Pulse: 62 62 61 (!) 59  Resp:  17 15 17 17   Temp:      TempSrc:      SpO2: 96% 96% 99% 97%   Constitutional: NAD, calm, acute on chronically ill-appearing  eyes: PERRL, lids and conjunctivae normal ENMT: Mucous membranes are moist, without exudate or lesions  Neck: normal, supple, no masses, no thyromegaly Respiratory: May be masked by body habitus, however she does appear to have decreased breath sounds, with wheezing, and bibasilar Rales Cardiovascular: Regular rate and rhythm,  murmur, rubs or gallops.  Bilateral 1+ lower, left greater than right extremity edema. 2+ pedal pulses. No carotid bruits.  Abdomen: Morbidly obese, soft, non tender, No hepatosplenomegaly. Bowel sounds positive.  Musculoskeletal: no clubbing / cyanosis. Moves all extremities Skin: no jaundice, No lesions.  Neurologic: Sensation intact  Strength equal in all extremities. Known Parkinson's  Psychiatric:   Alert and oriented x 3. Normal mood.    Labs on  Admission: I have personally reviewed following labs and imaging studies  CBC: Recent Labs  Lab 10/20/17 1156 10/20/17 1245  WBC  --  4.3  HGB 13.9 11.9*  HCT 41.0 37.9  MCV  --  73.4*  PLT  --  182    Basic Metabolic Panel: Recent Labs  Lab 10/20/17 1144 10/20/17 1156  NA 136 136  K 4.2 5.5*  CL 99* 101  CO2 27  --   GLUCOSE 126* 128*  BUN 21* 31*  CREATININE 1.09* 1.10*  CALCIUM 9.0  --     GFR: Estimated Creatinine Clearance: 60.3 mL/min (A) (by C-G formula based on SCr of 1.1 mg/dL (H)).  Liver Function Tests: No results for input(s): AST, ALT, ALKPHOS, BILITOT, PROT, ALBUMIN in the last 168 hours. No results for input(s): LIPASE, AMYLASE in the last 168 hours. No results for input(s): AMMONIA in the last 168 hours.  Coagulation Profile: No results for input(s): INR, PROTIME in the last 168 hours.  Cardiac Enzymes: No results for input(s): CKTOTAL, CKMB, CKMBINDEX, TROPONINI in the last 168 hours.  BNP (last 3 results) No results for input(s): PROBNP in the  last 8760 hours.  HbA1C: No results for input(s): HGBA1C in the last 72 hours.  CBG: No results for input(s): GLUCAP in the last 168 hours.  Lipid Profile: No results for input(s): CHOL, HDL, LDLCALC, TRIG, CHOLHDL, LDLDIRECT in the last 72 hours.  Thyroid Function Tests: No results for input(s): TSH, T4TOTAL, FREET4, T3FREE, THYROIDAB in the last 72 hours.  Anemia Panel: No results for input(s): VITAMINB12, FOLATE, FERRITIN, TIBC, IRON, RETICCTPCT in the last 72 hours.  Urine analysis:    Component Value Date/Time   COLORURINE YELLOW 06/23/2017 2047   APPEARANCEUR CLEAR 06/23/2017 2047   LABSPEC 1.018 06/23/2017 2047   PHURINE 6.0 06/23/2017 2047   GLUCOSEU >=500 (A) 06/23/2017 2047   GLUCOSEU NEGATIVE 04/17/2016 0955   HGBUR NEGATIVE 06/23/2017 2047   BILIRUBINUR NEGATIVE 06/23/2017 2047   [place] 06/23/2017 2047   PROTEINUR NEGATIVE 06/23/2017 2047   UROBILINOGEN 0.2 04/17/2016 0955   NITRITE NEGATIVE 06/23/2017 2047   LEUKOCYTESUR NEGATIVE 06/23/2017 2047    Sepsis Labs: '@LABRCNTIP'$ (procalcitonin:4,lacticidven:4) )No results found for this or any previous visit (from the past 240 hour(s)).   Radiological Exams on Admission: Dg Chest 2 View  Result Date: 10/20/2017 CLINICAL DATA:  Productive cough EXAM: CHEST - 2 VIEW COMPARISON:  06/23/2017 FINDINGS: The heart is moderately enlarged. Low lung volumes. Bibasilar atelectasis. No pneumothorax or pleural effusion. No interstitial edema. IMPRESSION: Cardiomegaly Bibasilar atelectasis. Electronically Signed   By: Marybelle Killings M.D.   On: 10/20/2017 12:24    EKG: Independently reviewed.  Assessment/Plan Principal Problem:   Acute on chronic respiratory failure with hypoxia (HCC) Active Problems:   Obesity hypoventilation syndrome (HCC)   Peripheral edema   Chronic pancreatitis (HCC)   Coronary artery disease, non-occlusive   Hypertensive heart disease   Dyslipidemia, goal LDL below 100   Morbid obesity  with BMI of 50.0-59.9, adult (HCC)   Type II diabetes mellitus, uncontrolled (HCC)   Seizure disorder (HCC)   OSA (obstructive sleep apnea) - not on CPAP   CHF exacerbation (HCC)    Acute hypoxic respiratory failure likely secondary toAcute on chronic diastolic CHF Class 2 in the setting of poor compliance to meds due to monetary reasons BNP 274.3 Troponin  0.01 EKG shows sinus rhythm APC right bundle branch block, without acute changes.  Last 2D echo December 2018 EF 55 to  21%, grade 2 diastolic, normal systolic.Chest x-ray shows fluid overload, cardiomegaly, no infiltrates  Received Lasix 40 mg IV x1, with significant urine production.  Place telemetry obs  CHF order set IV Lasix 40 mg bid  Strict I/Os Continue Coreg Continue Zaroxolyn prn   COPD exacerbation with wheezing, recent bronchitis, and OSA not on CPAP or O2 at home due to same . Afebrile, WBC normal  Has not been taking her nebs due to inability to afford meds  Received DuoNeb nebulizer, with improvement of her wheezing Duonebs q 6  O2  CPAP at night  CBC in am Incentive spirometry Care manager eval for ability to afford meds, including O2 and CPAP    Type II Diabetes with neuropahy Current blood sugar level is 128 Lab Results  Component Value Date   HGBA1C 8.6 04/17/2016  Hgb A1C Lantus , SSI Continue Lyrica   Hypertension BP  156/82   Pulse 57    Continue home anti-hypertensive medications   Hyperlipidemia Continue home statins   History of Seizures  Continue present therapy with Lamictal   Chronic pancreatitis, no acute issues COntinue Creon and Questran  Depression/ Parkinson's Continue Zoloft and SInemet    Prophylaxis   Heparin sq Code Status:  FUll  Family Communication:  Discussed with patient Disposition Plan: Expect patient to be discharged to home after condition improves Consults called:   Care Management  Admission status: Tele Obs    Sharene Butters, PA-C [facility]    Amion text  [phone]    10/20/2017, 4:25 PM

## 2017-10-21 ENCOUNTER — Ambulatory Visit: Payer: Medicare HMO | Admitting: Neurology

## 2017-10-21 ENCOUNTER — Other Ambulatory Visit: Payer: Self-pay

## 2017-10-21 DIAGNOSIS — Z87891 Personal history of nicotine dependence: Secondary | ICD-10-CM | POA: Diagnosis not present

## 2017-10-21 DIAGNOSIS — E1142 Type 2 diabetes mellitus with diabetic polyneuropathy: Secondary | ICD-10-CM | POA: Diagnosis present

## 2017-10-21 DIAGNOSIS — I110 Hypertensive heart disease with heart failure: Secondary | ICD-10-CM | POA: Diagnosis present

## 2017-10-21 DIAGNOSIS — I509 Heart failure, unspecified: Secondary | ICD-10-CM | POA: Diagnosis present

## 2017-10-21 DIAGNOSIS — Z599 Problem related to housing and economic circumstances, unspecified: Secondary | ICD-10-CM | POA: Diagnosis not present

## 2017-10-21 DIAGNOSIS — Z9114 Patient's other noncompliance with medication regimen: Secondary | ICD-10-CM | POA: Diagnosis not present

## 2017-10-21 DIAGNOSIS — R0602 Shortness of breath: Secondary | ICD-10-CM | POA: Diagnosis not present

## 2017-10-21 DIAGNOSIS — M109 Gout, unspecified: Secondary | ICD-10-CM | POA: Diagnosis present

## 2017-10-21 DIAGNOSIS — G20 Parkinson's disease: Secondary | ICD-10-CM | POA: Diagnosis present

## 2017-10-21 DIAGNOSIS — Z880 Allergy status to penicillin: Secondary | ICD-10-CM | POA: Diagnosis not present

## 2017-10-21 DIAGNOSIS — I4510 Unspecified right bundle-branch block: Secondary | ICD-10-CM | POA: Diagnosis present

## 2017-10-21 DIAGNOSIS — M1711 Unilateral primary osteoarthritis, right knee: Secondary | ICD-10-CM | POA: Diagnosis present

## 2017-10-21 DIAGNOSIS — J9621 Acute and chronic respiratory failure with hypoxia: Secondary | ICD-10-CM | POA: Diagnosis present

## 2017-10-21 DIAGNOSIS — G40909 Epilepsy, unspecified, not intractable, without status epilepticus: Secondary | ICD-10-CM | POA: Diagnosis present

## 2017-10-21 DIAGNOSIS — F329 Major depressive disorder, single episode, unspecified: Secondary | ICD-10-CM | POA: Diagnosis present

## 2017-10-21 DIAGNOSIS — E662 Morbid (severe) obesity with alveolar hypoventilation: Secondary | ICD-10-CM | POA: Diagnosis present

## 2017-10-21 DIAGNOSIS — J9811 Atelectasis: Secondary | ICD-10-CM | POA: Diagnosis present

## 2017-10-21 DIAGNOSIS — J441 Chronic obstructive pulmonary disease with (acute) exacerbation: Secondary | ICD-10-CM | POA: Diagnosis present

## 2017-10-21 DIAGNOSIS — I5033 Acute on chronic diastolic (congestive) heart failure: Secondary | ICD-10-CM | POA: Diagnosis present

## 2017-10-21 DIAGNOSIS — K861 Other chronic pancreatitis: Secondary | ICD-10-CM | POA: Diagnosis present

## 2017-10-21 DIAGNOSIS — Z6841 Body Mass Index (BMI) 40.0 and over, adult: Secondary | ICD-10-CM | POA: Diagnosis not present

## 2017-10-21 DIAGNOSIS — I5032 Chronic diastolic (congestive) heart failure: Secondary | ICD-10-CM | POA: Diagnosis not present

## 2017-10-21 DIAGNOSIS — E785 Hyperlipidemia, unspecified: Secondary | ICD-10-CM | POA: Diagnosis present

## 2017-10-21 DIAGNOSIS — I2510 Atherosclerotic heart disease of native coronary artery without angina pectoris: Secondary | ICD-10-CM | POA: Diagnosis present

## 2017-10-21 DIAGNOSIS — G4733 Obstructive sleep apnea (adult) (pediatric): Secondary | ICD-10-CM | POA: Diagnosis not present

## 2017-10-21 DIAGNOSIS — E1165 Type 2 diabetes mellitus with hyperglycemia: Secondary | ICD-10-CM | POA: Diagnosis present

## 2017-10-21 DIAGNOSIS — D509 Iron deficiency anemia, unspecified: Secondary | ICD-10-CM | POA: Diagnosis present

## 2017-10-21 DIAGNOSIS — Z882 Allergy status to sulfonamides status: Secondary | ICD-10-CM | POA: Diagnosis not present

## 2017-10-21 LAB — CBG MONITORING, ED
Glucose-Capillary: 189 mg/dL — ABNORMAL HIGH (ref 65–99)
Glucose-Capillary: 162 mg/dL — ABNORMAL HIGH (ref 65–99)

## 2017-10-21 LAB — CBC
HCT: 38.6 % (ref 36.0–46.0)
Hemoglobin: 11.8 g/dL — ABNORMAL LOW (ref 12.0–15.0)
MCH: 23 pg — ABNORMAL LOW (ref 26.0–34.0)
MCHC: 30.6 g/dL (ref 30.0–36.0)
MCV: 75.2 fL — ABNORMAL LOW (ref 78.0–100.0)
Platelets: 186 10*3/uL (ref 150–400)
RBC: 5.13 MIL/uL — ABNORMAL HIGH (ref 3.87–5.11)
RDW: 18.1 % — ABNORMAL HIGH (ref 11.5–15.5)
WBC: 4.3 10*3/uL (ref 4.0–10.5)

## 2017-10-21 LAB — BASIC METABOLIC PANEL
Anion gap: 12 (ref 5–15)
BUN: 16 mg/dL (ref 6–20)
CO2: 29 mmol/L (ref 22–32)
Calcium: 8.9 mg/dL (ref 8.9–10.3)
Chloride: 98 mmol/L — ABNORMAL LOW (ref 101–111)
Creatinine, Ser: 0.94 mg/dL (ref 0.44–1.00)
GFR calc Af Amer: >60 mL/min (ref >60)
GFR calc non Af Amer: >60 mL/min (ref >60)
Glucose, Bld: 143 mg/dL — ABNORMAL HIGH (ref 65–99)
Potassium: 4.1 mmol/L (ref 3.5–5.1)
Sodium: 139 mmol/L (ref 135–145)

## 2017-10-21 LAB — GLUCOSE, CAPILLARY
Glucose-Capillary: 166 mg/dL — ABNORMAL HIGH (ref 65–99)
Glucose-Capillary: 127 mg/dL — ABNORMAL HIGH (ref 65–99)
Glucose-Capillary: 174 mg/dL — ABNORMAL HIGH (ref 65–99)

## 2017-10-21 LAB — HEMOGLOBIN A1C
Hgb A1c MFr Bld: 7.1 % — ABNORMAL HIGH (ref 4.8–5.6)
Mean Plasma Glucose: 157.07 mg/dL

## 2017-10-21 MED ORDER — FAMOTIDINE 20 MG PO TABS
20.0000 mg | ORAL_TABLET | Freq: Two times a day (BID) | ORAL | Status: DC
  Administered 2017-10-21 – 2017-10-28 (×14): 20 mg via ORAL
  Filled 2017-10-21 (×14): qty 1

## 2017-10-21 MED ORDER — GUAIFENESIN ER 600 MG PO TB12
600.0000 mg | ORAL_TABLET | Freq: Two times a day (BID) | ORAL | Status: DC
  Administered 2017-10-21 – 2017-10-28 (×15): 600 mg via ORAL
  Filled 2017-10-21 (×15): qty 1

## 2017-10-21 MED ORDER — IPRATROPIUM-ALBUTEROL 0.5-2.5 (3) MG/3ML IN SOLN
3.0000 mL | Freq: Three times a day (TID) | RESPIRATORY_TRACT | Status: DC
  Administered 2017-10-22 – 2017-10-23 (×5): 3 mL via RESPIRATORY_TRACT
  Filled 2017-10-21 (×6): qty 3

## 2017-10-21 MED ORDER — IPRATROPIUM-ALBUTEROL 0.5-2.5 (3) MG/3ML IN SOLN
3.0000 mL | RESPIRATORY_TRACT | Status: DC
  Administered 2017-10-21 (×2): 3 mL via RESPIRATORY_TRACT
  Filled 2017-10-21 (×3): qty 3

## 2017-10-21 NOTE — ED Notes (Signed)
PT at bedside and stated pt desated to 91% on 2 L when moved to standing position

## 2017-10-21 NOTE — Progress Notes (Signed)
PROGRESS NOTE    [name]  ZWC:[id number] DOB: [birth year] DOA: 10/20/2017 PCP: [name], MD   Outpatient Specialists:     Brief Narrative:  Per admitting MD: 72 year old female with history of CHF, COPD, obesity hypoventilation syndrome who presents with new onset cough and dyspnea this morning. Review of systems is notable for increasing lower extremity swelling. Of note patient is noncompliant with medication regimen possibly secondary to financial issues. By the time I saw patient she stated that she was feeling much improved having diuresed with Lasix. She does note that she has continued lower extremity edema. Lung exam does have continued rales at bases along with intermittent diffuse wheezing.. Agree with assessment and plan as noted above with treatment for acute decompensation of heart failure with Lasix.    Assessment & Plan:   Principal Problem:   Acute on chronic respiratory failure with hypoxia (HCC) Active Problems:   Obesity hypoventilation syndrome (HCC)   Peripheral edema   Chronic pancreatitis (HCC)   Coronary artery disease, non-occlusive   Hypertensive heart disease   Dyslipidemia, goal LDL below 100   Morbid obesity with BMI of 50.0-59.9, adult (HCC)   Type II diabetes mellitus, uncontrolled ([place])   Seizure disorder (HCC)   OSA (obstructive sleep apnea) - not on CPAP   CHF exacerbation (HCC)   Acute hypoxic respiratory failure likely secondary toAcute on chronic diastolic CHF Class 2 in the setting of poor compliance to meds  - Last 2D echo December 2018 EF 55 to 23%, grade 2 diastolic, normal systolic.Chest x-ray shows fluid overload, cardiomegaly, no infiltrates  Received Lasix 40 mg IV x1, with significant urine production.  -continue IV Lasix 40 mg bid  Continue Coreg Down almost 4L -Cr improving   COPD exacerbation with wheezing, recent bronchitis, and OSA not on CPAP or O2 at home  -per chart, unable to afford CPAP -nebs -home O2  eval  -care management consult  Type II Diabetes with neuropathy  -lantus/SSI  Hypertension BP  -BP improved with diuresis -may need to lower doses of other medications Hyperlipidemia -home statins  History of Seizures  Continue present therapy with Lamictal   Chronic pancreatitis, no acute issues Continue Creon and Questran  Depression/ Parkinson's Continue Zoloft and SInemet  Obesity Body mass index is 50.74 kg/m.     DVT prophylaxis:  SQ Heparin   Code Status: Full Code   Family Communication:   Disposition Plan:     Consultants:         Subjective: Still coughing up lots of mucous    Objective: Vitals:   10/21/17 0830 10/21/17 0945 10/21/17 0948 10/21/17 1237  BP: (!) 123/47 107/68  106/76  Pulse: (!) 59 (!) 58  64  Resp: 12 16    Temp:  97.8 F (36.6 C)    TempSrc:  Oral    SpO2: 98% 97%    Weight:   121.8 kg (268 lb 8.3 oz)   Height:   5\' 1"  (1.549 m)     Intake/Output Summary (Last 24 hours) at 10/21/2017 1328 Last data filed at 10/21/2017 1147 Gross per 24 hour  Intake -  Output 3600 ml  Net -3600 ml   Filed Weights   10/21/17 0948  Weight: 121.8 kg (268 lb 8.3 oz)    Examination:  General exam: obese, chronically ill female Respiratory system: no wheezing Cardiovascular system: rrr, no pitting edema but large legs so difficult to assess volume status Gastrointestinal system: +BS, soft Central nervous system:  alert Psychiatry: Judgement and insight appear normal. Mood & affect appropriate.     Data Reviewed: I have personally reviewed following labs and imaging studies  CBC: Recent Labs  Lab 10/20/17 1156 10/20/17 1245 10/21/17 0824  WBC  --  4.3 4.3  HGB 13.9 11.9* 11.8*  HCT 41.0 37.9 38.6  MCV  --  73.4* 75.2*  PLT  --  192 932   Basic Metabolic Panel: Recent Labs  Lab 10/20/17 1144 10/20/17 1156 10/21/17 0824  NA 136 136 139  K 4.2 5.5* 4.1  CL 99* 101 98*  CO2 27  --  29  GLUCOSE 126*  128* 143*  BUN 21* 31* 16  CREATININE 1.09* 1.10* 0.94  CALCIUM 9.0  --  8.9   GFR: Estimated Creatinine Clearance: 69 mL/min (by C-G formula based on SCr of 0.94 mg/dL). Liver Function Tests: No results for input(s): AST, ALT, ALKPHOS, BILITOT, PROT, ALBUMIN in the last 168 hours. No results for input(s): LIPASE, AMYLASE in the last 168 hours. No results for input(s): AMMONIA in the last 168 hours. Coagulation Profile: No results for input(s): INR, PROTIME in the last 168 hours. Cardiac Enzymes: No results for input(s): CKTOTAL, CKMB, CKMBINDEX, TROPONINI in the last 168 hours. BNP (last 3 results) No results for input(s): PROBNP in the last 8760 hours. HbA1C: Recent Labs    10/21/17 0824  HGBA1C 7.1*   CBG: Recent Labs  Lab 10/20/17 1928 10/21/17 0100 10/21/17 0834 10/21/17 1153  GLUCAP 89 189* 162* 166*   Lipid Profile: No results for input(s): CHOL, HDL, LDLCALC, TRIG, CHOLHDL, LDLDIRECT in the last 72 hours. Thyroid Function Tests: No results for input(s): TSH, T4TOTAL, FREET4, T3FREE, THYROIDAB in the last 72 hours. Anemia Panel: No results for input(s): VITAMINB12, FOLATE, FERRITIN, TIBC, IRON, RETICCTPCT in the last 72 hours. Urine analysis:    Component Value Date/Time   COLORURINE YELLOW 06/23/2017 2047   APPEARANCEUR CLEAR 06/23/2017 2047   LABSPEC 1.018 06/23/2017 2047   PHURINE 6.0 06/23/2017 2047   GLUCOSEU >=500 (A) 06/23/2017 2047   GLUCOSEU NEGATIVE 04/17/2016 0955   HGBUR NEGATIVE 06/23/2017 2047   BILIRUBINUR NEGATIVE 06/23/2017 2047   [place] 06/23/2017 2047   PROTEINUR NEGATIVE 06/23/2017 2047   UROBILINOGEN 0.2 04/17/2016 0955   NITRITE NEGATIVE 06/23/2017 2047   LEUKOCYTESUR NEGATIVE 06/23/2017 2047     )No results found for this or any previous visit (from the past 240 hour(s)).    Anti-infectives (From admission, onward)   None       Radiology Studies: Dg Chest 2 View  Result Date: 10/20/2017 CLINICAL DATA:   Productive cough EXAM: CHEST - 2 VIEW COMPARISON:  06/23/2017 FINDINGS: The heart is moderately enlarged. Low lung volumes. Bibasilar atelectasis. No pneumothorax or pleural effusion. No interstitial edema. IMPRESSION: Cardiomegaly Bibasilar atelectasis. Electronically Signed   By: Marybelle Killings M.D.   On: 10/20/2017 12:24        Scheduled Meds: . atorvastatin  10 mg Oral Daily  . carbidopa-levodopa  1 tablet Oral TID  . carvedilol  25 mg Oral BID WC  . cholecalciferol  1,000 Units Oral Daily  . famotidine  20 mg Oral BID  . furosemide  40 mg Intravenous BID  . guaiFENesin  600 mg Oral BID  . heparin  5,000 Units Subcutaneous Q8H  . insulin aspart  0-9 Units Subcutaneous TID WC  . insulin glargine  30 Units Subcutaneous QHS  . ipratropium-albuterol  3 mL Nebulization Q4H  . isosorbide mononitrate  60  mg Oral Daily  . lamoTRIgine  100 mg Oral BID  . lipase/protease/amylase  48,000 Units Oral BID WC   And  . lipase/protease/amylase  24,000 Units Oral Q lunch  . pregabalin  100 mg Oral BID  . sertraline  50 mg Oral QPM   Continuous Infusions:   LOS: 0 days    Time spent: 25 min    Geradine Girt, DO [facility] Pager [phone]  If 7PM-7AM, please contact night-coverage [email or web address] Password [email or web address] 10/21/2017, 1:29 PM

## 2017-10-21 NOTE — Evaluation (Signed)
Physical Therapy Evaluation Patient Details Name: [name] MRN: [id number] DOB: 1945-12-18 Today's Date: 10/21/2017   History of Present Illness  72 year old female with history of CHF, COPD, obesity hypoventilation syndrome who presents with new onset cough and dyspnea on 4/28. Review of systems is notable for increasing lower extremity swelling. Of note patient is noncompliant with medication regimen possibly secondary to financial issues  Clinical Impression  Pt was seen this AM and nursing noted that her sats were low all AM including difficulty with progressing mobility to a chair.  Pt wa stired with all effort but did help to get scooted up the bed.  Will follow her acutely for strength and balance to shorten her need to stay in SNF.  Follow along as ordered.    Follow Up Recommendations SNF    Equipment Recommendations  None recommended by PT    Recommendations for Other Services       Precautions / Restrictions Precautions Precautions: Fall Precaution Comments: monitor O2 sats  Restrictions Weight Bearing Restrictions: No      Mobility  Bed Mobility Overal bed mobility: Needs Assistance Bed Mobility: Supine to Sit;Sit to Supine     Supine to sit: Min assist;HOB elevated Sit to supine: Min assist   General bed mobility comments: min assist to support her as sats are low  Transfers Overall transfer level: Needs assistance Equipment used: 1 person hand held assist Transfers: Sit to/from Stand Sit to Stand: Min assist;Mod assist         General transfer comment: assist to power up and O2 sats dropped with the effort to 90%  Ambulation/Gait Ambulation/Gait assistance: Min assist Ambulation Distance (Feet): 8 Feet Assistive device: 1 person hand held assist Gait Pattern/deviations: Step-to pattern;Shuffle;Wide base of support;Decreased stride length Gait velocity: reduced Gait velocity interpretation: <1.8 ft/sec, indicate of risk for recurrent  falls General Gait Details: pt was able to maneuver bedside but unsafe, very low O2 sats with effort down to 90%  Stairs            Wheelchair Mobility    Modified Rankin (Stroke Patients Only)       Balance Overall balance assessment: Needs assistance Sitting-balance support: Feet supported Sitting balance-Leahy Scale: Good     Standing balance support: Bilateral upper extremity supported Standing balance-Leahy Scale: Poor Standing balance comment: requires BUE support and cues for safety                             Pertinent Vitals/Pain Pain Assessment: No/denies pain    Home Living Family/patient expects to be discharged to:: Private residence Living Arrangements: Other relatives Available Help at Discharge: Family;Available 24 hours/day Type of Home: House Home Access: Stairs to enter Entrance Stairs-Rails: Psychiatric nurse of Steps: 2 Home Layout: One level Home Equipment: Cane - single point;Bedside commode;Walker - 4 wheels Additional Comments: pt was up with no family assist previously, sister has back issues and cannot help her    Prior Function Level of Independence: Independent with assistive device(s);Needs assistance   Gait / Transfers Assistance Needed: ambulates with use of SPC.   ADL's / Homemaking Assistance Needed: pt reports she receives assist for donning LE braces and for tying shoes        Hand Dominance   Dominant Hand: Right    Extremity/Trunk Assessment   Upper Extremity Assessment Upper Extremity Assessment: Generalized weakness    Lower Extremity Assessment Lower Extremity Assessment: Generalized weakness  Cervical / Trunk Assessment Cervical / Trunk Assessment: Normal  Communication   Communication: No difficulties  Cognition Arousal/Alertness: Awake/alert Behavior During Therapy: WFL for tasks assessed/performed Overall Cognitive Status: Within Functional Limits for tasks assessed                                         General Comments      Exercises Other Exercises Other Exercises: low O2 sats this AM   Assessment/Plan    PT Assessment Patient needs continued PT services  PT Problem List Decreased strength;Decreased range of motion;Decreased activity tolerance;Decreased balance;Decreased mobility;Decreased coordination;Decreased safety awareness;Decreased knowledge of use of DME;Cardiopulmonary status limiting activity       PT Treatment Interventions DME instruction;Gait training;Functional mobility training;Therapeutic activities;Therapeutic exercise;Stair training;Neuromuscular re-education;Balance training;Patient/family education    PT Goals (Current goals can be found in the Care Plan section)  Acute Rehab PT Goals Patient Stated Goal: maintain independence  PT Goal Formulation: With patient Time For Goal Achievement: 11/04/17 Potential to Achieve Goals: Good    Frequency Min 2X/week   Barriers to discharge Decreased caregiver support sister cannot help lift her    Co-evaluation               AM-PAC PT "6 Clicks" Daily Activity  Outcome Measure Difficulty turning over in bed (including adjusting bedclothes, sheets and blankets)?: Unable Difficulty moving from lying on back to sitting on the side of the bed? : Unable Difficulty sitting down on and standing up from a chair with arms (e.g., wheelchair, bedside commode, etc,.)?: Unable Help needed moving to and from a bed to chair (including a wheelchair)?: A Little Help needed walking in hospital room?: A Lot Help needed climbing 3-5 steps with a railing? : A Lot 6 Click Score: 10    End of Session Equipment Utilized During Treatment: Gait belt;Oxygen Activity Tolerance: Treatment limited secondary to medical complications (Comment) Patient left: in bed;with call bell/phone within reach;with bed alarm set;with nursing/sitter in room Nurse Communication: Mobility status PT  Visit Diagnosis: Unsteadiness on feet (R26.81);Muscle weakness (generalized) (M62.81);History of falling (Z91.81);Difficulty in walking, not elsewhere classified (R26.2)    Time: 9628-3662 PT Time Calculation (min) (ACUTE ONLY): 34 min   Charges:   PT Evaluation $PT Eval Moderate Complexity: 1 Mod PT Treatments $Gait Training: 8-22 mins   PT G Codes:   PT G-Codes **NOT FOR INPATIENT CLASS** Functional Assessment Tool Used: AM-PAC 6 Clicks Basic Mobility    Ramond Dial 10/21/2017, 6:57 PM   Mee Hives, PT MS Acute Rehab Dept. Number: [place]

## 2017-10-21 NOTE — Progress Notes (Signed)
Patient arrived from [facility] to 4E room 21 on 2L [place] O2 for CHF/COPD exacerbation.  Telemetry monitor applied and CCMD notified.  Patient oriented to unit and room to include call light and phone.  Will continue to monitor.

## 2017-10-21 NOTE — Evaluation (Signed)
Occupational Therapy Evaluation Patient Details Name: [name] MRN: [id number] DOB: 1945/12/04 Today's Date: 10/21/2017    History of Present Illness 72 year old female with history of CHF, COPD, obesity hypoventilation syndrome who presents with new onset cough and dyspnea on 4/28. Review of systems is notable for increasing lower extremity swelling. Of note patient is noncompliant with medication regimen possibly secondary to financial issues   Clinical Impression   This 72 y/o female presents with the above. At baseline pt reports mod independence using RW for functional mobility, reports she receives intermittent assist for LB dressing. Pt completed stand pivot transfers using RW with MinA this session; currently requires ModA for LB and toileting ADLs. Pt on 1L O2 during session with lowest SpO2 noted to be 87% with activity. Pt will benefit from continued acute OT services and recommend additional [place] services after discharge to maximize her safety and independence with ADLs and functional mobility.    Follow Up Recommendations  Home health OT;Supervision/Assistance - 24 hour    Equipment Recommendations  None recommended by OT;Other (comment)(Pt's DME needs are met )           Precautions / Restrictions Precautions Precautions: Fall Precaution Comments: monitor O2 sats  Required Braces or Orthoses: (pt reports she wears bil braces on feet, does not currently have them with her ) Restrictions Weight Bearing Restrictions: No      Mobility Bed Mobility Overal bed mobility: Needs Assistance Bed Mobility: Supine to Sit     Supine to sit: Min guard;HOB elevated     General bed mobility comments: MinGuard for safety, increased time/effort   Transfers Overall transfer level: Needs assistance Equipment used: Rolling walker (2 wheeled) Transfers: Sit to/from [facility] Sit to Stand: Min assist Stand pivot transfers: Min assist       General  transfer comment: assist to rise and steady, intermittent VCs for safe hand placement; pt completing stand pivot to [facility] and recliner using RW with min steadying assist    Balance Overall balance assessment: Needs assistance Sitting-balance support: Feet supported Sitting balance-Leahy Scale: Good Sitting balance - Comments: static sitting EOB    Standing balance support: Bilateral upper extremity supported;During functional activity;Single extremity supported Standing balance-Leahy Scale: Poor Standing balance comment: reliant on UE support/minA from therapist during static standing                            ADL either performed or assessed with clinical judgement   ADL Overall ADL's : Needs assistance/impaired Eating/Feeding: Modified independent;Sitting   Grooming: Set up;Sitting   Upper Body Bathing: Min guard;Sitting   Lower Body Bathing: Moderate assistance;Sit to/from stand   Upper Body Dressing : Min guard;Sitting   Lower Body Dressing: Moderate assistance;Sit to/from stand   Toilet Transfer: Minimal assistance;Stand-pivot;BSC;RW   Toileting- Clothing Manipulation and Hygiene: Moderate assistance;Sit to/from stand Toileting - Clothing Manipulation Details (indicate cue type and reason): pt able to perform clothing management with MinA provided in standing; assist for peri-care after BM      Functional mobility during ADLs: Minimal assistance;Rolling walker(for stand pivot transfers ) General ADL Comments: Pt completing stand pivot transfer to [facility] and to recliner with overall MinA and RW; Pt on 1L O2 during session with lowest SpO2 noted to be 87% with activity, overall rebounds within approx 30sec-81mn to 91% and above  Pertinent Vitals/Pain Pain Assessment: No/denies pain     Hand Dominance Right   Extremity/Trunk Assessment Upper Extremity Assessment Upper Extremity Assessment: Generalized weakness   Lower Extremity  Assessment Lower Extremity Assessment: Defer to PT evaluation       Communication Communication Communication: No difficulties   Cognition Arousal/Alertness: Awake/alert Behavior During Therapy: WFL for tasks assessed/performed Overall Cognitive Status: Within Functional Limits for tasks assessed                                                      Home Living Family/patient expects to be discharged to:: Private residence Living Arrangements: Other relatives(sister, brother-in-law ) Available Help at Discharge: Family;Available 24 hours/day Type of Home: House Home Access: Stairs to enter [employer] of Steps: 2 Entrance Stairs-Rails: Right;Left Home Layout: One level     Bathroom Shower/Tub: Corporate investment banker: Standard     Home Equipment: Cane - single point;Bedside commode;Walker - 4 wheels          Prior Functioning/Environment Level of Independence: Independent with assistive device(s);Needs assistance  Gait / Transfers Assistance Needed: ambulates with use of SPC.  ADL's / Homemaking Assistance Needed: pt reports she receives assist for donning LE braces and for tying shoes            OT Problem List: Decreased strength;Decreased activity tolerance;Impaired balance (sitting and/or standing)      OT Treatment/Interventions: Self-care/ADL training;DME and/or AE instruction;Therapeutic activities;Balance training;Therapeutic exercise;Patient/family education;Energy conservation    OT Goals(Current goals can be found in the care plan section) Acute Rehab OT Goals Patient Stated Goal: maintain independence  OT Goal Formulation: With patient Time For Goal Achievement: 11/04/17 Potential to Achieve Goals: Good  OT Frequency: Min 2X/week                             AM-PAC PT "6 Clicks" Daily Activity     Outcome Measure Help from another person eating meals?: None Help from another person  taking care of personal grooming?: A Little Help from another person toileting, which includes using toliet, bedpan, or urinal?: A Lot Help from another person bathing (including washing, rinsing, drying)?: A Lot Help from another person to put on and taking off regular upper body clothing?: None Help from another person to put on and taking off regular lower body clothing?: A Lot 6 Click Score: 17   End of Session Equipment Utilized During Treatment: Gait belt;Rolling walker;Oxygen Nurse Communication: Mobility status  Activity Tolerance: Patient tolerated treatment well Patient left: in chair;with call bell/phone within reach;with chair alarm set  OT Visit Diagnosis: Muscle weakness (generalized) (M62.81)                Time: 2761-4709 OT Time Calculation (min): 47 min Charges:  OT Evaluation $OT Eval Moderate Complexity: 1 Mod OT Treatments $Self Care/Home Management : 23-37 mins G-Codes:     Lou Cal, OT Pager [phone] 10/21/2017   Raymondo Band 10/21/2017, 2:56 PM

## 2017-10-22 ENCOUNTER — Other Ambulatory Visit: Payer: Self-pay

## 2017-10-22 LAB — CBC
HCT: 37.7 % (ref 36.0–46.0)
Hemoglobin: 11.7 g/dL — ABNORMAL LOW (ref 12.0–15.0)
MCH: 23.2 pg — ABNORMAL LOW (ref 26.0–34.0)
MCHC: 31 g/dL (ref 30.0–36.0)
MCV: 74.7 fL — ABNORMAL LOW (ref 78.0–100.0)
Platelets: 191 10*3/uL (ref 150–400)
RBC: 5.05 MIL/uL (ref 3.87–5.11)
RDW: 18.1 % — ABNORMAL HIGH (ref 11.5–15.5)
WBC: 5.7 10*3/uL (ref 4.0–10.5)

## 2017-10-22 LAB — BASIC METABOLIC PANEL
Anion gap: 10 (ref 5–15)
BUN: 14 mg/dL (ref 6–20)
CO2: 34 mmol/L — ABNORMAL HIGH (ref 22–32)
Calcium: 8.9 mg/dL (ref 8.9–10.3)
Chloride: 95 mmol/L — ABNORMAL LOW (ref 101–111)
Creatinine, Ser: 0.95 mg/dL (ref 0.44–1.00)
GFR calc Af Amer: >60 mL/min (ref >60)
GFR calc non Af Amer: 60 mL/min — ABNORMAL LOW (ref >60)
Glucose, Bld: 113 mg/dL — ABNORMAL HIGH (ref 65–99)
Potassium: 3.3 mmol/L — ABNORMAL LOW (ref 3.5–5.1)
Sodium: 139 mmol/L (ref 135–145)

## 2017-10-22 LAB — GLUCOSE, CAPILLARY
Glucose-Capillary: 118 mg/dL — ABNORMAL HIGH (ref 65–99)
Glucose-Capillary: 169 mg/dL — ABNORMAL HIGH (ref 65–99)
Glucose-Capillary: 162 mg/dL — ABNORMAL HIGH (ref 65–99)
Glucose-Capillary: 140 mg/dL — ABNORMAL HIGH (ref 65–99)

## 2017-10-22 LAB — MAGNESIUM: Magnesium: 1.7 mg/dL (ref 1.7–2.4)

## 2017-10-22 MED ORDER — FUROSEMIDE 40 MG PO TABS: 40.0000 mg | ORAL_TABLET | ORAL | Status: DC

## 2017-10-22 MED ORDER — POTASSIUM CHLORIDE CRYS ER 20 MEQ PO TBCR
40.0000 meq | EXTENDED_RELEASE_TABLET | Freq: Once | ORAL | Status: AC
  Administered 2017-10-22: 40 meq via ORAL
  Filled 2017-10-22: qty 2

## 2017-10-22 MED ORDER — METOLAZONE 5 MG PO TABS
2.5000 mg | ORAL_TABLET | ORAL | Status: DC
  Administered 2017-10-24 – 2017-10-27 (×3): 2.5 mg via ORAL
  Filled 2017-10-22 (×3): qty 1

## 2017-10-22 MED ORDER — FUROSEMIDE 40 MG PO TABS
40.0000 mg | ORAL_TABLET | ORAL | Status: DC
  Administered 2017-10-22 – 2017-10-27 (×4): 40 mg via ORAL
  Filled 2017-10-22 (×4): qty 1

## 2017-10-22 MED ORDER — CARVEDILOL 12.5 MG PO TABS
12.5000 mg | ORAL_TABLET | Freq: Two times a day (BID) | ORAL | Status: DC
  Administered 2017-10-22 – 2017-10-28 (×11): 12.5 mg via ORAL
  Filled 2017-10-22 (×12): qty 1

## 2017-10-22 MED ORDER — FUROSEMIDE 80 MG PO TABS
80.0000 mg | ORAL_TABLET | ORAL | Status: DC
  Administered 2017-10-23 – 2017-10-28 (×3): 80 mg via ORAL
  Filled 2017-10-22 (×3): qty 1

## 2017-10-22 NOTE — Progress Notes (Signed)
PROGRESS NOTE    [name]  WUJ:[id number] DOB: [birth year] DOA: 10/20/2017 PCP: [name], MD   Outpatient Specialists:     Brief Narrative:  Per admitting MD: 72 year old female with history of CHF, COPD, obesity hypoventilation syndrome who presents with new onset cough and dyspnea this morning. Review of systems is notable for increasing lower extremity swelling. Of note patient is noncompliant with medication regimen possibly secondary to financial issues. By the time I saw patient she stated that she was feeling much improved having diuresed with Lasix. She does note that she has continued lower extremity edema. Lung exam does have continued rales at bases along with intermittent diffuse wheezing.. Agree with assessment and plan as noted above with treatment for acute decompensation of heart failure with Lasix.    Assessment & Plan:   Principal Problem:   Acute on chronic respiratory failure with hypoxia (HCC) Active Problems:   Obesity hypoventilation syndrome (HCC)   Peripheral edema   Chronic pancreatitis (HCC)   Coronary artery disease, non-occlusive   Hypertensive heart disease   Dyslipidemia, goal LDL below 100   Morbid obesity with BMI of 50.0-59.9, adult (HCC)   Type II diabetes mellitus, uncontrolled ([place])   Seizure disorder (HCC)   OSA (obstructive sleep apnea) - not on CPAP   CHF exacerbation (HCC)   Acute hypoxic respiratory failure likely secondary toAcute on chronic diastolic CHF Class 2 in the setting of poor compliance to meds as well as obesity hypoventilation - Last 2D echo December 2018 EF 55 to 95%, grade 2 diastolic, normal systolic.Chest x-ray shows fluid overload, cardiomegaly, no infiltrates  Received Lasix 40 mg IV x1, with significant urine production.  -continue IV Lasix 40 mg bid  Continue Coreg at lower dose Down almost 4L -Cr improving   COPD exacerbation with wheezing, recent bronchitis, and OSA not on CPAP or O2 at home  -per  chart, unable to afford CPAP -nebs -home O2 eval  -care management consult  Type II Diabetes with neuropathy  -lantus/SSI  Hypertension BP  -BP improved with diuresis-- ? If patient actually taking meds at home -decrease coreg  Hyperlipidemia -home statins  History of Seizures  Continue present therapy with Lamictal   Chronic pancreatitis, no acute issues Continue Creon and Questran  Depression/ Parkinson's Continue Zoloft and SInemet  Obesity Body mass index is 49.83 kg/m.  Mild anemia -check Fe panel    DVT prophylaxis:  SQ Heparin   Code Status: Full Code   Family Communication:   Disposition Plan:     Consultants:         Subjective: Still coughing up lots of mucous  Per patient -was not taking metolazone at home with 40mg  of lasix  Objective: Vitals:   10/22/17 0448 10/22/17 0453 10/22/17 0735 10/22/17 0851  BP:  (!) 102/51  (!) 104/51  Pulse:  (!) 58    Resp:      Temp:  98.3 F (36.8 C)  98.2 F (36.8 C)  TempSrc:  Oral  Oral  SpO2: 96%  99%   Weight:      Height:        Intake/Output Summary (Last 24 hours) at 10/22/2017 0937 Last data filed at 10/22/2017 0434 Gross per 24 hour  Intake 100 ml  Output 1400 ml  Net -1300 ml   Filed Weights   10/21/17 0948 10/22/17 0425  Weight: 121.8 kg (268 lb 8.3 oz) 119.6 kg (263 lb 11.2 oz)    Examination:  General exam:  obese female in bed Respiratory system: diminished, no increased work of breathing Cardiovascular system: rrr Gastrointestinal system: +Bs, soft Central nervous system: alert and oriented     Data Reviewed: I have personally reviewed following labs and imaging studies  CBC: Recent Labs  Lab 10/20/17 1156 10/20/17 1245 10/21/17 0824 10/22/17 0348  WBC  --  4.3 4.3 5.7  HGB 13.9 11.9* 11.8* 11.7*  HCT 41.0 37.9 38.6 37.7  MCV  --  73.4* 75.2* 74.7*  PLT  --  192 186 387   Basic Metabolic Panel: Recent Labs  Lab 10/20/17 1144 10/20/17 1156  10/21/17 0824 10/22/17 0348  NA 136 136 139 139  K 4.2 5.5* 4.1 3.3*  CL 99* 101 98* 95*  CO2 27  --  29 34*  GLUCOSE 126* 128* 143* 113*  BUN 21* 31* 16 14  CREATININE 1.09* 1.10* 0.94 0.95  CALCIUM 9.0  --  8.9 8.9   GFR: Estimated Creatinine Clearance: 67.5 mL/min (by C-G formula based on SCr of 0.95 mg/dL). Liver Function Tests: No results for input(s): AST, ALT, ALKPHOS, BILITOT, PROT, ALBUMIN in the last 168 hours. No results for input(s): LIPASE, AMYLASE in the last 168 hours. No results for input(s): AMMONIA in the last 168 hours. Coagulation Profile: No results for input(s): INR, PROTIME in the last 168 hours. Cardiac Enzymes: No results for input(s): CKTOTAL, CKMB, CKMBINDEX, TROPONINI in the last 168 hours. BNP (last 3 results) No results for input(s): PROBNP in the last 8760 hours. HbA1C: Recent Labs    10/21/17 0824  HGBA1C 7.1*   CBG: Recent Labs  Lab 10/21/17 0834 10/21/17 1153 10/21/17 1631 10/21/17 2115 10/22/17 0610  GLUCAP 162* 166* 127* 174* 118*   Lipid Profile: No results for input(s): CHOL, HDL, LDLCALC, TRIG, CHOLHDL, LDLDIRECT in the last 72 hours. Thyroid Function Tests: No results for input(s): TSH, T4TOTAL, FREET4, T3FREE, THYROIDAB in the last 72 hours. Anemia Panel: No results for input(s): VITAMINB12, FOLATE, FERRITIN, TIBC, IRON, RETICCTPCT in the last 72 hours. Urine analysis:    Component Value Date/Time   COLORURINE YELLOW 06/23/2017 2047   APPEARANCEUR CLEAR 06/23/2017 2047   LABSPEC 1.018 06/23/2017 2047   PHURINE 6.0 06/23/2017 2047   GLUCOSEU >=500 (A) 06/23/2017 2047   GLUCOSEU NEGATIVE 04/17/2016 0955   HGBUR NEGATIVE 06/23/2017 2047   BILIRUBINUR NEGATIVE 06/23/2017 2047   [place] 06/23/2017 2047   PROTEINUR NEGATIVE 06/23/2017 2047   UROBILINOGEN 0.2 04/17/2016 0955   NITRITE NEGATIVE 06/23/2017 2047   LEUKOCYTESUR NEGATIVE 06/23/2017 2047     )No results found for this or any previous visit (from  the past 240 hour(s)).    Anti-infectives (From admission, onward)   None       Radiology Studies: Dg Chest 2 View  Result Date: 10/20/2017 CLINICAL DATA:  Productive cough EXAM: CHEST - 2 VIEW COMPARISON:  06/23/2017 FINDINGS: The heart is moderately enlarged. Low lung volumes. Bibasilar atelectasis. No pneumothorax or pleural effusion. No interstitial edema. IMPRESSION: Cardiomegaly Bibasilar atelectasis. Electronically Signed   By: Marybelle Killings M.D.   On: 10/20/2017 12:24        Scheduled Meds: . atorvastatin  10 mg Oral Daily  . carbidopa-levodopa  1 tablet Oral TID  . carvedilol  12.5 mg Oral BID WC  . cholecalciferol  1,000 Units Oral Daily  . famotidine  20 mg Oral BID  . [START ON 10/23/2017] furosemide  80 mg Oral Once per day on Mon Wed Fri   And  . furosemide  40 mg Oral Once per day on Sun Tue Thu Sat  . guaiFENesin  600 mg Oral BID  . heparin  5,000 Units Subcutaneous Q8H  . insulin aspart  0-9 Units Subcutaneous TID WC  . insulin glargine  30 Units Subcutaneous QHS  . ipratropium-albuterol  3 mL Nebulization TID  . isosorbide mononitrate  60 mg Oral Daily  . lamoTRIgine  100 mg Oral BID  . lipase/protease/amylase  48,000 Units Oral BID WC   And  . lipase/protease/amylase  24,000 Units Oral Q lunch  . [START ON 10/23/2017] metolazone  2.5 mg Oral Once per day on Sun Tue Thu Sat  . pregabalin  100 mg Oral BID  . sertraline  50 mg Oral QPM   Continuous Infusions:   LOS: 1 day    Time spent: 25 min    Geradine Girt, DO [facility] Pager [phone]  If 7PM-7AM, please contact night-coverage [email or web address] Password [facility] 10/22/2017, 9:37 AM

## 2017-10-22 NOTE — Telephone Encounter (Signed)
LM TO CALL BACK WILL AWAIT A RETURN CALL FROM PT ./CY °

## 2017-10-23 ENCOUNTER — Inpatient Hospital Stay (HOSPITAL_COMMUNITY): Payer: Medicare HMO

## 2017-10-23 LAB — FERRITIN: Ferritin: 40 ng/mL (ref 11–307)

## 2017-10-23 LAB — BASIC METABOLIC PANEL
Anion gap: 8 (ref 5–15)
BUN: 13 mg/dL (ref 6–20)
CO2: 33 mmol/L — ABNORMAL HIGH (ref 22–32)
Calcium: 8.8 mg/dL — ABNORMAL LOW (ref 8.9–10.3)
Chloride: 95 mmol/L — ABNORMAL LOW (ref 101–111)
Creatinine, Ser: 1.08 mg/dL — ABNORMAL HIGH (ref 0.44–1.00)
GFR calc Af Amer: 59 mL/min — ABNORMAL LOW (ref >60)
GFR calc non Af Amer: 51 mL/min — ABNORMAL LOW (ref >60)
Glucose, Bld: 155 mg/dL — ABNORMAL HIGH (ref 65–99)
Potassium: 3.7 mmol/L (ref 3.5–5.1)
Sodium: 136 mmol/L (ref 135–145)

## 2017-10-23 LAB — IRON AND TIBC
Iron: 20 ug/dL — ABNORMAL LOW (ref 28–170)
Saturation Ratios: 6 % — ABNORMAL LOW (ref 10.4–31.8)
TIBC: 326 ug/dL (ref 250–450)
UIBC: 306 ug/dL

## 2017-10-23 LAB — CBC
HCT: 36.7 % (ref 36.0–46.0)
Hemoglobin: 11.5 g/dL — ABNORMAL LOW (ref 12.0–15.0)
MCH: 23.3 pg — ABNORMAL LOW (ref 26.0–34.0)
MCHC: 31.3 g/dL (ref 30.0–36.0)
MCV: 74.3 fL — ABNORMAL LOW (ref 78.0–100.0)
Platelets: 187 10*3/uL (ref 150–400)
RBC: 4.94 MIL/uL (ref 3.87–5.11)
RDW: 17.9 % — ABNORMAL HIGH (ref 11.5–15.5)
WBC: 8.3 10*3/uL (ref 4.0–10.5)

## 2017-10-23 LAB — GLUCOSE, CAPILLARY
Glucose-Capillary: 98 mg/dL (ref 65–99)
Glucose-Capillary: 139 mg/dL — ABNORMAL HIGH (ref 65–99)
Glucose-Capillary: 157 mg/dL — ABNORMAL HIGH (ref 65–99)
Glucose-Capillary: 172 mg/dL — ABNORMAL HIGH (ref 65–99)

## 2017-10-23 LAB — URIC ACID: Uric Acid, Serum: 7.5 mg/dL — ABNORMAL HIGH (ref 2.3–6.6)

## 2017-10-23 MED ORDER — MAGNESIUM SULFATE 2 GM/50ML IV SOLN
2.0000 g | Freq: Once | INTRAVENOUS | Status: AC
  Administered 2017-10-23: 2 g via INTRAVENOUS
  Filled 2017-10-23: qty 50

## 2017-10-23 MED ORDER — COLCHICINE 0.6 MG PO TABS
0.6000 mg | ORAL_TABLET | Freq: Every day | ORAL | Status: DC
  Administered 2017-10-23 – 2017-10-28 (×6): 0.6 mg via ORAL
  Filled 2017-10-23 (×6): qty 1

## 2017-10-23 MED ORDER — DICLOFENAC SODIUM 1 % TD GEL
2.0000 g | Freq: Four times a day (QID) | TRANSDERMAL | Status: DC
  Administered 2017-10-23 – 2017-10-28 (×20): 2 g via TOPICAL
  Filled 2017-10-23: qty 100

## 2017-10-23 MED ORDER — IPRATROPIUM-ALBUTEROL 0.5-2.5 (3) MG/3ML IN SOLN
3.0000 mL | RESPIRATORY_TRACT | Status: DC | PRN
  Administered 2017-10-24: 3 mL via RESPIRATORY_TRACT
  Filled 2017-10-23: qty 3

## 2017-10-23 MED ORDER — COLCHICINE 0.6 MG PO TABS
1.2000 mg | ORAL_TABLET | Freq: Once | ORAL | Status: AC
  Administered 2017-10-23: 1.2 mg via ORAL
  Filled 2017-10-23: qty 2

## 2017-10-23 MED ORDER — SODIUM CHLORIDE 0.9 % IV SOLN
510.0000 mg | Freq: Once | INTRAVENOUS | Status: AC
  Administered 2017-10-23: 510 mg via INTRAVENOUS
  Filled 2017-10-23: qty 17

## 2017-10-23 NOTE — Clinical Social Work Note (Signed)
Clinical Social Work Assessment  Patient Details  Name: [name] MRN: [id number] Date of Birth: 1946-01-03  Date of referral:  10/23/17               Reason for consult:  Discharge Planning, Facility Placement                Permission sought to share information with:  Family Supports Permission granted to share information::  Yes, Verbal Permission Granted  Name::     [name]  Agency::  snf  Relationship::  sister  Contact Information:  [phone]  Housing/Transportation Living arrangements for the past 2 months:  Single Family Home Source of Information:  Patient Patient Interpreter Needed:  None Criminal Activity/Legal Involvement Pertinent to Current Situation/Hospitalization:  No - Comment as needed Significant Relationships:  Adult Children, Siblings, Other Family Members Lives with:  Siblings Do you feel safe going back to the place where you live?  Yes Need for family participation in patient care:  Yes (Comment)  Care giving concerns:  No family at bedside. Patient stated she lives with her sister [name] and her sisters husband. Patient stated she has been at a facility in the past and did not like her experience there. Patient stated she has children and other family members in the area that are all supportive of her  Social Worker assessment / plan:  CSW met patient at bedside to offer support and discharge needs. Patient stated her sister is on the way to the hospital to talk to physical therapy. Patient stated she does not want to go to rehab but if sister wants her to go she will. CSW went over the benefit of rehab but patient stated there is no place like home. CSW will follow up with patients sister for rehab placement.  Employment status:  Retired Nurse, adult PT Recommendations:  [place] / Referral to community resources:  [place]  Patient/Family's Response to care:   Patient is aware of her diagnoses and knows that she is not at baseline but stated she wants to be home  Patient/Family's Understanding of and Emotional Response to Diagnosis, Current Treatment, and Prognosis:  CSW will continue to follow patient for support   Emotional Assessment Appearance:  Appears older than stated age Attitude/Demeanor/Rapport:  Engaged Affect (typically observed):  Accepting Orientation:  Oriented to Self, Oriented to Situation, Oriented to Place, Oriented to  Time Alcohol / Substance use:  Not Applicable Psych involvement (Current and /or in the community):  No (Comment)  Discharge Needs  Concerns to be addressed:  Patient refuses services Readmission within the last 30 days:  No Current discharge risk:  Dependent with Mobility Barriers to Discharge:  Continued Medical Work up   [employer], [place] 10/23/2017, 12:52 PM

## 2017-10-23 NOTE — Progress Notes (Signed)
Occupational Therapy Treatment Patient Details Name: [name] MRN: [id number] DOB: 07-Aug-1945 Today's Date: 10/23/2017    History of present illness 72 year old female with history of CHF, COPD, obesity hypoventilation syndrome who presents with new onset cough and dyspnea on 4/28. Review of systems is notable for increasing lower extremity swelling. Of note patient is noncompliant with medication regimen possibly secondary to financial issues   OT comments  Pt's progression towards OT goals limited today due to rt knee and hip pain. Pt unable to perform [facility] transfer without assist and required 2 person max assist with STEDY to get from bed to bsc to chair. Total A for rear peri care in standing +2 for safety currently. Pt on RA throughout with SpO2 >88% except a few times dipped to 88% for <10 sec. Pt currently refusing SNF and wants to return home with sister. Explained to pt concern with her return home at this level of functioning from both a functional and safety standpoint and do not feel sister will be able to assist her at this level. Dc recommendations updated to reflect current status.   Follow Up Recommendations  SNF;Supervision/Assistance - 24 hour    Equipment Recommendations  None recommended by OT(Pt has appropriate DME)    Recommendations for Other Services      Precautions / Restrictions Precautions Precautions: Fall Precaution Comments: monitor O2 sats  Restrictions Weight Bearing Restrictions: No       Mobility Bed Mobility Overal bed mobility: Needs Assistance Bed Mobility: Supine to Sit     Supine to sit: HOB elevated;Mod assist     General bed mobility comments: Pt sitting EOB with PT when OT arrived  Transfers Overall transfer level: Needs assistance Equipment used: 4-wheeled walker;Ambulation equipment used Transfers: Sit to/from [facility] Sit to Stand: +2 physical assistance;Max assist Stand pivot transfers: +2 physical  assistance;Max assist       General transfer comment: Heavy assist to bring hips and trunk up. Pt limited by rt knee and hip pain. Stood from elevated bed with rollator and extreme difficulty taking pivotal steps. Eventually moved bed so bsc could be pulled up behind patient. Used Stedy to transfer from bsc to chair.    Balance Overall balance assessment: Needs assistance Sitting-balance support: Feet supported Sitting balance-Leahy Scale: Good     Standing balance support: Bilateral upper extremity supported Standing balance-Leahy Scale: Poor Standing balance comment: Stedy and 2 person assist                           ADL either performed or assessed with clinical judgement   ADL Overall ADL's : Needs assistance/impaired     Grooming: Wash/dry hands;Set up;Sitting Grooming Details (indicate cue type and reason): in recliner after [facility] transfer                 Toilet Transfer: Maximal assistance;+2 for physical assistance;+2 for safety/equipment;BSC Toilet Transfer Details (indicate cue type and reason): used stedy to facilitate transfer, VERY PAINFUL R knee and hip impacting transfer Toileting- Clothing Manipulation and Hygiene: Maximal assistance;Sit to/from stand;+2 for physical assistance;+2 for safety/equipment Toileting - Clothing Manipulation Details (indicate cue type and reason): Pt able to stand, everything else completed by therapists     Functional mobility during ADLs: Maximal assistance;+2 for physical assistance;+2 for safety/equipment(use of stedy)       Vision       Perception     Praxis      Cognition  Arousal/Alertness: Awake/alert Behavior During Therapy: WFL for tasks assessed/performed Overall Cognitive Status: Within Functional Limits for tasks assessed                                 General Comments: states that she wants to decline SNF, however she is requiring +2 max A for transfers.         Exercises      Shoulder Instructions       General Comments      Pertinent Vitals/ Pain       Pain Assessment: Faces Faces Pain Scale: Hurts whole lot Pain Location: rt knee and hip Pain Descriptors / Indicators: Grimacing;Sharp Pain Intervention(s): Limited activity within patient's tolerance;Monitored during session;Repositioned;Patient requesting pain meds-RN notified  Home Living                                          Prior Functioning/Environment              Frequency  Min 2X/week        Progress Toward Goals  OT Goals(current goals can now be found in the care plan section)  Progress towards OT goals: Not progressing toward goals - comment(new pain in R knee>hip)  Acute Rehab OT Goals Patient Stated Goal: go home OT Goal Formulation: With patient Time For Goal Achievement: 11/04/17 Potential to Achieve Goals: Good  Plan Discharge plan needs to be updated;Frequency remains appropriate    Co-evaluation    PT/OT/SLP Co-Evaluation/Treatment: Yes Reason for Co-Treatment: For patient/therapist safety;To address functional/ADL transfers PT goals addressed during session: Mobility/safety with mobility;Balance OT goals addressed during session: ADL's and self-care;Proper use of Adaptive equipment and DME      AM-PAC PT "6 Clicks" Daily Activity     Outcome Measure   Help from another person eating meals?: None Help from another person taking care of personal grooming?: A Little Help from another person toileting, which includes using toliet, bedpan, or urinal?: A Lot Help from another person bathing (including washing, rinsing, drying)?: A Lot Help from another person to put on and taking off regular upper body clothing?: A Little Help from another person to put on and taking off regular lower body clothing?: A Lot 6 Click Score: 16    End of Session Equipment Utilized During Treatment: Gait belt;Rolling walker  OT Visit Diagnosis: Muscle weakness  (generalized) (M62.81);Pain Pain - Right/Left: Right Pain - part of body: Knee   Activity Tolerance Patient limited by pain   Patient Left in chair;with call bell/phone within reach;with chair alarm set   Nurse Communication Mobility status        Time: 7353-2992 OT Time Calculation (min): 22 min  Charges: OT General Charges $OT Visit: 1 Visit OT Treatments $Self Care/Home Management : 8-22 mins  Hulda Humphrey OTR/L [place] 10/23/2017, 12:45 PM

## 2017-10-23 NOTE — NC FL2 (Signed)
[place] LEVEL OF CARE SCREENING TOOL     IDENTIFICATION  Patient Name: [name] Birthdate: [birth year] Sex: female Admission Date (Current Location): 10/20/2017  [facility] and [place] Number:  Herbalist and Address:  [address]. [facility], [place] [address], [place], [place] [address]      Provider Number: [id number]  Attending Physician Name and Address:  Geradine Girt, DO  Relative Name and Phone Number:  [name], [phone]    Current Level of Care: Hospital Recommended Level of Care: [place] Prior Approval Number:    Date Approved/Denied:   PASRR Number: [id number]  Discharge Plan: SNF    Current Diagnoses: Patient Active Problem List   Diagnosis Date Noted  . CHF exacerbation ([place]) 10/20/2017  . OSA (obstructive sleep apnea) - not on CPAP 10/07/2017  . 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 Chronic diastolic CHF (congestive heart failure), NYHA class 2 ([place])   . Acute on chronic respiratory failure with hypoxia ([place])   . Acute lower GI bleeding   . Stroke-like symptoms 06/15/2017  . Rectal bleeding 06/15/2017  . Dehydration 03/07/2017  . Medication management 12/24/2016  . Pre-syncope 03/09/2016  . Racing heart beat 03/09/2016  . Spinal stenosis at L4-L5 level 10/04/2014  . Lower abdominal pain 10/31/2012  . Type II diabetes mellitus, uncontrolled ([place]) 10/31/2012  . Seizure disorder ([place]) 10/31/2012  . Diverticulosis 10/31/2012  . Anemia associated with acute blood loss 08/14/2012  . Hematochezia 08/14/2012  . Varicose veins of bilateral lower extremities with other complications 84/13/2440  . Leg pain 03/21/2012  . Radicular pain of left lower extremity 03/21/2012  . Infected pseudocyst of pancreas 10/28/2011  . Chronic pancreatitis ([place]) 10/28/2011  . Hypertensive heart disease 10/28/2011  . Chronic respiratory failure ([place]) 10/28/2011  . Dyslipidemia, goal LDL below 100 10/28/2011  .  Morbid obesity with BMI of 50.0-59.9, adult ([place]) 10/28/2011  . Peripheral edema 04/14/2011  . Obesity hypoventilation syndrome ([place]) 10/26/2010  . Coronary artery disease, non-occlusive 08/28/2010    Orientation RESPIRATION BLADDER Height & Weight     Self, Time, Situation, Place  Normal External catheter Weight: 271 lb 8 oz (123.2 kg) Height:  5\' 1"  (154.9 cm)  BEHAVIORAL SYMPTOMS/MOOD NEUROLOGICAL BOWEL NUTRITION STATUS      Continent Diet(heart healthy/carb modified)  AMBULATORY STATUS COMMUNICATION OF NEEDS Skin   Extensive Assist Verbally                         Personal Care Assistance Level of Assistance  Bathing, Feeding, Dressing Bathing Assistance: Limited assistance Feeding assistance: Independent Dressing Assistance: Limited assistance     Functional Limitations Info  Hearing, Speech, Sight Sight Info: Adequate Hearing Info: Adequate Speech Info: Adequate    SPECIAL CARE FACTORS FREQUENCY  PT (By licensed PT), OT (By licensed OT)     PT Frequency: 5x wk OT Frequency: 5x wk            Contractures Contractures Info: Not present    Additional Factors Info  Code Status, Allergies Code Status Info: full code Allergies Info: PENICILLINS, SULFA ANTIBIOTICS, ASPIRIN, CODEINE, OTHER           Current Medications (10/23/2017):  This is the current hospital active medication list Current Facility-Administered Medications  Medication Dose Route Frequency Provider Last Rate Last Dose  . acetaminophen (TYLENOL) tablet 650 mg  650 mg Oral Q6H PRN Rondel Jumbo, PA-C  Or  . acetaminophen (TYLENOL) suppository 650 mg  650 mg Rectal Q6H PRN Rondel Jumbo, PA-C      . atorvastatin (LIPITOR) tablet 10 mg  10 mg Oral Daily Rondel Jumbo, PA-C   10 mg at 10/23/17 0944  . carbidopa-levodopa (SINEMET IR) 25-100 MG per tablet immediate release 1 tablet  1 tablet Oral TID Rondel Jumbo, PA-C   1 tablet at 10/23/17 0944  . carvedilol (COREG) tablet 12.5  mg  12.5 mg Oral BID WC Vann, Jessica U, DO   12.5 mg at 10/23/17 0943  . cholecalciferol (VITAMIN D) tablet 1,000 Units  1,000 Units Oral Daily Rondel Jumbo, PA-C   1,000 Units at 10/23/17 6948  . cholestyramine (QUESTRAN) packet 4 g  4 g Oral BID PRN Rondel Jumbo, PA-C      . diclofenac sodium (VOLTAREN) 1 % transdermal gel 2 g  2 g Topical QID Vann, Jessica U, DO      . famotidine (PEPCID) tablet 20 mg  20 mg Oral BID Vann, Jessica U, DO   20 mg at 10/23/17 1145  . ferumoxytol (FERAHEME) 510 mg in sodium chloride 0.9 % 100 mL IVPB  510 mg Intravenous Once Vann, Jessica U, DO      . furosemide (LASIX) tablet 80 mg  80 mg Oral Once per day on Mon Wed Fri Vann, Jessica U, DO   80 mg at 10/23/17 5462   And  . furosemide (LASIX) tablet 40 mg  40 mg Oral Once per day on Sun Tue Thu Sat Eulogio Bear U, DO   40 mg at 10/22/17 7035  . guaiFENesin (MUCINEX) 12 hr tablet 600 mg  600 mg Oral BID Eulogio Bear U, DO   600 mg at 10/23/17 0943  . heparin injection 5,000 Units  5,000 Units Subcutaneous Q8H Rondel Jumbo, PA-C   5,000 Units at 10/23/17 0533  . HYDROcodone-acetaminophen (NORCO/VICODIN) 5-325 MG per tablet 1-2 tablet  1-2 tablet Oral Q4H PRN Rondel Jumbo, PA-C   2 tablet at 10/23/17 0944  . insulin aspart (novoLOG) injection 0-9 Units  0-9 Units Subcutaneous TID WC Rondel Jumbo, PA-C   1 Units at 10/23/17 1210  . insulin glargine (LANTUS) injection 30 Units  30 Units Subcutaneous QHS Rondel Jumbo, [place]   30 Units at 10/22/17 2120  . ipratropium-albuterol (DUONEB) 0.5-2.5 (3) MG/3ML nebulizer solution 3 mL  3 mL Nebulization TID Eulogio Bear U, DO   3 mL at 10/23/17 0741  . isosorbide mononitrate (IMDUR) 24 hr tablet 60 mg  60 mg Oral Daily Rondel Jumbo, PA-C   60 mg at 10/23/17 0943  . lamoTRIgine (LAMICTAL) tablet 100 mg  100 mg Oral BID Rondel Jumbo, PA-C   100 mg at 10/23/17 0949  . lipase/protease/amylase (CREON) capsule 48,000 Units  48,000 Units Oral BID WC  Vashti Hey, MD   48,000 Units at 10/23/17 0093   And  . lipase/protease/amylase (CREON) capsule 24,000 Units  24,000 Units Oral Q lunch Vashti Hey, MD   24,000 Units at 10/23/17 1206  . magnesium sulfate IVPB 2 g 50 mL  2 g Intravenous Once Eulogio Bear U, DO 50 mL/hr at 10/23/17 1206 2 g at 10/23/17 1206  . metolazone (ZAROXOLYN) tablet 2.5 mg  2.5 mg Oral Once per day on Sun Tue Thu Sat Vann, Jessica U, DO      . ondansetron [facility]) tablet 4 mg  4 mg Oral Q6H PRN Shawn Route,  Coralee Pesa, PA-C       Or  . ondansetron [facility]) injection 4 mg  4 mg Intravenous Q6H PRN Rondel Jumbo, PA-C      . pregabalin (LYRICA) capsule 100 mg  100 mg Oral BID Rondel Jumbo, PA-C   100 mg at 10/23/17 5427  . sertraline (ZOLOFT) tablet 50 mg  50 mg Oral QPM Rondel Jumbo, PA-C   50 mg at 10/22/17 1715     Discharge Medications: Please see discharge summary for a list of discharge medications.  Relevant Imaging Results:  Relevant Lab Results:   Additional Information SS# [id number]  Wende Neighbors, LCSW

## 2017-10-23 NOTE — Progress Notes (Signed)
PROGRESS NOTE    [name]  YIF:[id number] DOB: [birth year] DOA: 10/20/2017 PCP: [name], MD   Outpatient Specialists:     Brief Narrative:  Per admitting MD: 72 year old female with history of CHF, COPD, obesity hypoventilation syndrome who presents with new onset cough and dyspnea this morning. Review of systems is notable for increasing lower extremity swelling. Of note patient is noncompliant with medication regimen possibly secondary to financial issues.  Breathing has slowly improved but then on 5/1 developed right knee pain consistent with arthritis/gout.  Patient is considering SNF as well.     Assessment & Plan:   Principal Problem:   Acute on chronic respiratory failure with hypoxia (HCC) Active Problems:   Obesity hypoventilation syndrome (HCC)   Peripheral edema   Chronic pancreatitis (HCC)   Coronary artery disease, non-occlusive   Hypertensive heart disease   Dyslipidemia, goal LDL below 100   Morbid obesity with BMI of 50.0-59.9, adult (HCC)   Type II diabetes mellitus, uncontrolled ([place])   Seizure disorder (HCC)   OSA (obstructive sleep apnea) - not on CPAP   CHF exacerbation (HCC)   Acute hypoxic respiratory failure likely secondary toAcute on chronic diastolic CHF Class 2 in the setting of poor compliance to meds as well as obesity hypoventilation - Last 2D echo December 2018 EF 55 to 86%, grade 2 diastolic, normal systolic.Chest x-ray shows fluid overload, cardiomegaly, no infiltrates  Received Lasix 40 mg IV x1, with significant urine production.  -IV lasix converted to home dose Continue Coreg at lower dose Down almost 5L -Cr stable  Right knee pain (due to gout/arthritis) -xray shows severe 3 compartment arthritis -uric acid elevated like in a gout flare -treat with colchicine 1.2 x 1 then 0.6 daily -monitor for results   COPD exacerbation with wheezing, recent bronchitis, and OSA not on CPAP or O2 at home  -per chart, unable to  afford CPAP -nebs -home O2 eval   Type II Diabetes with neuropathy /uncontrolled -lantus/SSI  Hypertension BP  -BP improved with diuresis-- ? If patient actually taking meds at home -decrease coreg  Hyperlipidemia -home statin  History of Seizures (unknon type)  Continue present therapy with Lamictal   Chronic pancreatitis, no acute issues Continue Creon and Questran  Depression/ Parkinson's Continue Zoloft and SInemet  Obesity Body mass index is 51.3 kg/m.  Mild anemia -Fe panel with low ratios - IV Fe x 1 (Po iron causes constipation)    DVT prophylaxis:  SQ Heparin   Code Status: Full Code   Family Communication: None at bedside  Disposition Plan:  Home or SNF in AM if knee improved   Consultants:         Subjective: Breathing much improved but now with knee pain   Objective: Vitals:   10/23/17 0430 10/23/17 0616 10/23/17 0742 10/23/17 1155  BP: (!) 137/58   (!) 110/56  Pulse:   74 73  Resp: 14  14 16   Temp: 97.8 F (36.6 C)   99.5 F (37.5 C)  TempSrc: Oral   Oral  SpO2: 96%  97% 90%  Weight:  123.2 kg (271 lb 8 oz)    Height:        Intake/Output Summary (Last 24 hours) at 10/23/2017 1336 Last data filed at 10/23/2017 0732 Gross per 24 hour  Intake -  Output 700 ml  Net -700 ml   Filed Weights   10/21/17 0948 10/22/17 0425 10/23/17 0616  Weight: 121.8 kg (268 lb 8.3 oz) 119.6 kg (  263 lb 11.2 oz) 123.2 kg (271 lb 8 oz)    Examination:  General exam: obese female sitting in chair Respiratory system: diminished, no wheezing, no increased work of breathing Gastrointestinal system: +BS, soft/obese Central nervous system: alert Pain with palpation of right knee-- increases with bending/movement     Data Reviewed: I have personally reviewed following labs and imaging studies  CBC: Recent Labs  Lab 10/20/17 1156 10/20/17 1245 10/21/17 0824 10/22/17 0348 10/23/17 0257  WBC  --  4.3 4.3 5.7 8.3  HGB 13.9 11.9*  11.8* 11.7* 11.5*  HCT 41.0 37.9 38.6 37.7 36.7  MCV  --  73.4* 75.2* 74.7* 74.3*  PLT  --  192 186 191 440   Basic Metabolic Panel: Recent Labs  Lab 10/20/17 1144 10/20/17 1156 10/21/17 0824 10/22/17 0348 10/23/17 0257  NA 136 136 139 139 136  K 4.2 5.5* 4.1 3.3* 3.7  CL 99* 101 98* 95* 95*  CO2 27  --  29 34* 33*  GLUCOSE 126* 128* 143* 113* 155*  BUN 21* 31* 16 14 13   CREATININE 1.09* 1.10* 0.94 0.95 1.08*  CALCIUM 9.0  --  8.9 8.9 8.8*  MG  --   --   --  1.7  --    GFR: Estimated Creatinine Clearance: 60.5 mL/min (A) (by C-G formula based on SCr of 1.08 mg/dL (H)). Liver Function Tests: No results for input(s): AST, ALT, ALKPHOS, BILITOT, PROT, ALBUMIN in the last 168 hours. No results for input(s): LIPASE, AMYLASE in the last 168 hours. No results for input(s): AMMONIA in the last 168 hours. Coagulation Profile: No results for input(s): INR, PROTIME in the last 168 hours. Cardiac Enzymes: No results for input(s): CKTOTAL, CKMB, CKMBINDEX, TROPONINI in the last 168 hours. BNP (last 3 results) No results for input(s): PROBNP in the last 8760 hours. HbA1C: Recent Labs    10/21/17 0824  HGBA1C 7.1*   CBG: Recent Labs  Lab 10/22/17 1053 10/22/17 1643 10/22/17 2043 10/23/17 0624 10/23/17 1100  GLUCAP 169* 162* 140* 98 139*   Lipid Profile: No results for input(s): CHOL, HDL, LDLCALC, TRIG, CHOLHDL, LDLDIRECT in the last 72 hours. Thyroid Function Tests: No results for input(s): TSH, T4TOTAL, FREET4, T3FREE, THYROIDAB in the last 72 hours. Anemia Panel: Recent Labs    10/23/17 0257  FERRITIN 40  TIBC 326  IRON 20*   Urine analysis:    Component Value Date/Time   COLORURINE YELLOW 06/23/2017 2047   APPEARANCEUR CLEAR 06/23/2017 2047   LABSPEC 1.018 06/23/2017 2047   PHURINE 6.0 06/23/2017 2047   GLUCOSEU >=500 (A) 06/23/2017 2047   GLUCOSEU NEGATIVE 04/17/2016 0955   HGBUR NEGATIVE 06/23/2017 2047   BILIRUBINUR NEGATIVE 06/23/2017 2047    KETONESUR NEGATIVE 06/23/2017 2047   PROTEINUR NEGATIVE 06/23/2017 2047   UROBILINOGEN 0.2 04/17/2016 0955   NITRITE NEGATIVE 06/23/2017 2047   LEUKOCYTESUR NEGATIVE 06/23/2017 2047     )No results found for this or any previous visit (from the past 240 hour(s)).    Anti-infectives (From admission, onward)   None       Radiology Studies: Dg Knee 1-2 Views Right  Result Date: 10/23/2017 CLINICAL DATA:  Sudden onset of pain and swelling today EXAM: RIGHT KNEE - 1-2 VIEW COMPARISON:  07/15/2012 FINDINGS: Osseous demineralization. Tricompartmental joint space narrowing and spur formation. No acute fracture, dislocation, or bone destruction. No knee joint effusion. IMPRESSION: Advanced tricompartmental osteoarthritic changes of the RIGHT knee. No acute abnormalities. Electronically Signed   By: Lavonia Dana  M.D.   On: 10/23/2017 13:18        Scheduled Meds: . atorvastatin  10 mg Oral Daily  . carbidopa-levodopa  1 tablet Oral TID  . carvedilol  12.5 mg Oral BID WC  . cholecalciferol  1,000 Units Oral Daily  . colchicine  1.2 mg Oral Once   And  . colchicine  0.6 mg Oral Daily  . diclofenac sodium  2 g Topical QID  . famotidine  20 mg Oral BID  . furosemide  80 mg Oral Once per day on Mon Wed Fri   And  . furosemide  40 mg Oral Once per day on Sun Tue Thu Sat  . guaiFENesin  600 mg Oral BID  . heparin  5,000 Units Subcutaneous Q8H  . insulin aspart  0-9 Units Subcutaneous TID WC  . insulin glargine  30 Units Subcutaneous QHS  . ipratropium-albuterol  3 mL Nebulization TID  . isosorbide mononitrate  60 mg Oral Daily  . lamoTRIgine  100 mg Oral BID  . lipase/protease/amylase  48,000 Units Oral BID WC   And  . lipase/protease/amylase  24,000 Units Oral Q lunch  . metolazone  2.5 mg Oral Once per day on Sun Tue Thu Sat  . pregabalin  100 mg Oral BID  . sertraline  50 mg Oral QPM   Continuous Infusions: . ferumoxytol       LOS: 2 days    Time spent: 25  min    Geradine Girt, DO [facility] Pager [phone]  If 7PM-7AM, please contact night-coverage [email or web address] Password [email or web address] 10/23/2017, 1:36 PM

## 2017-10-23 NOTE — Progress Notes (Signed)
Physical Therapy Treatment Patient Details Name: [name] MRN: [id number] DOB: 1945-11-24 Today's Date: 10/23/2017    History of Present Illness 72 year old female with history of CHF, COPD, obesity hypoventilation syndrome who presents with new onset cough and dyspnea on 4/28. Review of systems is notable for increasing lower extremity swelling. Of note patient is noncompliant with medication regimen possibly secondary to financial issues    PT Comments    Pt with very poor mobility today with reports of rt knee and hip pain. Pt unable to amb at all and required 2 person max assist to get from bed to bsc to chair. Pt on RA throughout with SpO2 >88% except a few times dipped to 88% for <10 sec. Pt currently refusing SNF and wants to return home with sister. Currently explained to pt my concern with her return home at this level of functioning and do not feel sister will be able to assist her at this level.    Follow Up Recommendations  SNF     Equipment Recommendations  None recommended by PT    Recommendations for Other Services       Precautions / Restrictions Precautions Precautions: Fall Precaution Comments: monitor O2 sats  Restrictions Weight Bearing Restrictions: No    Mobility  Bed Mobility Overal bed mobility: Needs Assistance Bed Mobility: Supine to Sit     Supine to sit: HOB elevated;Mod assist     General bed mobility comments: Assist to move legs off of bed, elevate trunk into sitting, and bring hips to EOB  Transfers Overall transfer level: Needs assistance Equipment used: 4-wheeled walker;Ambulation equipment used Transfers: Sit to/from [facility] Sit to Stand: +2 physical assistance;Max assist Stand pivot transfers: +2 physical assistance;Max assist       General transfer comment: Heavy assist to bring hips and trunk up. Pt limited by rt knee and hip pain. Stood from elevated bed with rollator and extreme difficulty taking  pivotal steps. Eventually moved bed so bsc could be pulled up behind patient. Used Stedy to transfer from bsc to chair.  Ambulation/Gait             General Gait Details: Pt unable due to rt knee and hip pain.   Stairs             Wheelchair Mobility    Modified Rankin (Stroke Patients Only)       Balance Overall balance assessment: Needs assistance Sitting-balance support: Feet supported Sitting balance-Leahy Scale: Good     Standing balance support: Bilateral upper extremity supported Standing balance-Leahy Scale: Poor Standing balance comment: Stedy and 2 person assist                            Cognition Arousal/Alertness: Awake/alert Behavior During Therapy: WFL for tasks assessed/performed Overall Cognitive Status: Within Functional Limits for tasks assessed                                        Exercises      General Comments        Pertinent Vitals/Pain Pain Assessment: Faces Faces Pain Scale: Hurts whole lot Pain Location: rt knee and hip Pain Descriptors / Indicators: Grimacing;[place]                      Prior Function  PT Goals (current goals can now be found in the care plan section) Progress towards PT goals: Not progressing toward goals - comment(rt knee and hip pain now)    Frequency    Min 2X/week      PT Plan Current plan remains appropriate    Co-evaluation PT/OT/SLP Co-Evaluation/Treatment: Yes Reason for Co-Treatment: For patient/therapist safety PT goals addressed during session: Mobility/safety with mobility        AM-PAC PT "6 Clicks" Daily Activity  Outcome Measure  Difficulty turning over in bed (including adjusting bedclothes, sheets and blankets)?: Unable Difficulty moving from lying on back to sitting on the side of the bed? : Unable Difficulty sitting down on and standing up from a chair with arms (e.g., wheelchair, bedside commode,  etc,.)?: Unable Help needed moving to and from a bed to chair (including a wheelchair)?: Total Help needed walking in hospital room?: Total Help needed climbing 3-5 steps with a railing? : Total 6 Click Score: 6    End of Session   Activity Tolerance: No increased pain Patient left: with call bell/phone within reach;in chair;with chair alarm set Nurse Communication: Mobility status;Need for lift equipment PT Visit Diagnosis: Unsteadiness on feet (R26.81);Muscle weakness (generalized) (M62.81);History of falling (Z91.81);Difficulty in walking, not elsewhere classified (R26.2)     Time: 2694-8546 PT Time Calculation (min) (ACUTE ONLY): 37 min  Charges:  $Therapeutic Activity: 8-22 mins                    G Codes:       [facility] PT [place] 10/23/2017, 12:27 PM

## 2017-10-24 ENCOUNTER — Other Ambulatory Visit (HOSPITAL_COMMUNITY): Payer: Medicare HMO

## 2017-10-24 ENCOUNTER — Inpatient Hospital Stay (HOSPITAL_COMMUNITY): Payer: Medicare HMO

## 2017-10-24 LAB — GLUCOSE, CAPILLARY
Glucose-Capillary: 111 mg/dL — ABNORMAL HIGH (ref 65–99)
Glucose-Capillary: 145 mg/dL — ABNORMAL HIGH (ref 65–99)
Glucose-Capillary: 135 mg/dL — ABNORMAL HIGH (ref 65–99)
Glucose-Capillary: 138 mg/dL — ABNORMAL HIGH (ref 65–99)

## 2017-10-24 LAB — BASIC METABOLIC PANEL
Anion gap: 11 (ref 5–15)
BUN: 17 mg/dL (ref 6–20)
CO2: 28 mmol/L (ref 22–32)
Calcium: 8.6 mg/dL — ABNORMAL LOW (ref 8.9–10.3)
Chloride: 93 mmol/L — ABNORMAL LOW (ref 101–111)
Creatinine, Ser: 1.24 mg/dL — ABNORMAL HIGH (ref 0.44–1.00)
GFR calc Af Amer: 50 mL/min — ABNORMAL LOW (ref >60)
GFR calc non Af Amer: 43 mL/min — ABNORMAL LOW (ref >60)
Glucose, Bld: 119 mg/dL — ABNORMAL HIGH (ref 65–99)
Potassium: 3.7 mmol/L (ref 3.5–5.1)
Sodium: 132 mmol/L — ABNORMAL LOW (ref 135–145)

## 2017-10-24 LAB — CBC
HCT: 34.7 % — ABNORMAL LOW (ref 36.0–46.0)
Hemoglobin: 10.8 g/dL — ABNORMAL LOW (ref 12.0–15.0)
MCH: 23.1 pg — ABNORMAL LOW (ref 26.0–34.0)
MCHC: 31.1 g/dL (ref 30.0–36.0)
MCV: 74.1 fL — ABNORMAL LOW (ref 78.0–100.0)
Platelets: 200 10*3/uL (ref 150–400)
RBC: 4.68 MIL/uL (ref 3.87–5.11)
RDW: 18.1 % — ABNORMAL HIGH (ref 11.5–15.5)
WBC: 5.9 10*3/uL (ref 4.0–10.5)

## 2017-10-24 LAB — MAGNESIUM: Magnesium: 1.8 mg/dL (ref 1.7–2.4)

## 2017-10-24 LAB — PHOSPHORUS: Phosphorus: 3.7 mg/dL (ref 2.5–4.6)

## 2017-10-24 MED ORDER — FUROSEMIDE 10 MG/ML IJ SOLN
40.0000 mg | Freq: Once | INTRAMUSCULAR | Status: AC
  Administered 2017-10-24: 40 mg via INTRAVENOUS
  Filled 2017-10-24: qty 4

## 2017-10-24 NOTE — Progress Notes (Signed)
Physical Therapy Treatment Patient Details Name: [name] MRN: [id number] DOB: 22-Jul-1945 Today's Date: 10/24/2017    History of Present Illness 72 year old female with history of CHF, COPD, obesity hypoventilation syndrome who presents with new onset cough and dyspnea on 4/28. Review of systems is notable for increasing lower extremity swelling. Of note patient is noncompliant with medication regimen possibly secondary to financial issues    PT Comments    Pt with some small improvements in knee pain and mobility but still requiring significant 2 person assist for bed to chair transfer. Continue to recommend ST-SNF.    Follow Up Recommendations  SNF     Equipment Recommendations  None recommended by PT    Recommendations for Other Services       Precautions / Restrictions Precautions Precautions: Fall Precaution Comments: monitor O2 sats  Restrictions Weight Bearing Restrictions: No    Mobility  Bed Mobility Overal bed mobility: Needs Assistance Bed Mobility: Supine to Sit     Supine to sit: +2 for physical assistance;Mod assist;HOB elevated     General bed mobility comments: Assist to move legs off of bed, elevate trunk into sitting, and bring hips to EOB  Transfers Overall transfer level: Needs assistance Equipment used: Rolling walker (2 wheeled) Transfers: Sit to/from [facility] Sit to Stand: +2 physical assistance;Mod assist Stand pivot transfers: +2 physical assistance;Mod assist       General transfer comment: Assist to bring hips up. Assist for pivotal steps from bed to recliner. Pt in flexed position throughout   Ambulation/Gait             General Gait Details: Pivotal steps for bed to recliner   Stairs             Wheelchair Mobility    Modified Rankin (Stroke Patients Only)       Balance Overall balance assessment: Needs assistance Sitting-balance support: Feet supported Sitting balance-Leahy Scale:  Good     Standing balance support: Bilateral upper extremity supported Standing balance-Leahy Scale: Poor Standing balance comment: walker and +2 min assist to maintain static standing                             Cognition Arousal/Alertness: Awake/alert Behavior During Therapy: WFL for tasks assessed/performed Overall Cognitive Status: Within Functional Limits for tasks assessed                                        Exercises      General Comments        Pertinent Vitals/Pain Pain Assessment: Faces Faces Pain Scale: Hurts even more Pain Location: lt hip and rt knee Pain Descriptors / Indicators: Grimacing;[place] Pain Intervention(s): Limited activity within patient's tolerance;Monitored during session    Home Living                      Prior Function            PT Goals (current goals can now be found in the care plan section) Progress towards PT goals: Progressing toward goals    Frequency    Min 2X/week      PT Plan Current plan remains appropriate    Co-evaluation PT/OT/SLP Co-Evaluation/Treatment: Yes            AM-PAC PT "6 Clicks" Daily Activity  Outcome Measure  Difficulty turning over in bed (including adjusting bedclothes, sheets and blankets)?: Unable Difficulty moving from lying on back to sitting on the side of the bed? : Unable Difficulty sitting down on and standing up from a chair with arms (e.g., wheelchair, bedside commode, etc,.)?: Unable Help needed moving to and from a bed to chair (including a wheelchair)?: A Lot Help needed walking in hospital room?: Total Help needed climbing 3-5 steps with a railing? : Total 6 Click Score: 7    End of Session Equipment Utilized During Treatment: Gait belt;Oxygen Activity Tolerance: Patient limited by pain Patient left: with call bell/phone within reach;in chair;with chair alarm set Nurse Communication: Mobility status;Need for lift equipment(May need  stedy if unable to stand with walker) PT Visit Diagnosis: Unsteadiness on feet (R26.81);Muscle weakness (generalized) (M62.81);History of falling (Z91.81);Difficulty in walking, not elsewhere classified (R26.2)     Time: 4970-2637 PT Time Calculation (min) (ACUTE ONLY): 20 min  Charges:  $Therapeutic Activity: 8-22 mins                    G Codes:       [facility] PT [place] 10/24/2017, 5:18 PM

## 2017-10-24 NOTE — Progress Notes (Signed)
PROGRESS NOTE    [name]  SAY:[id number] DOB: 1946-03-04 DOA: 10/20/2017 PCP: [name], MD   Outpatient Specialists:     Brief Narrative:  Per admitting MD: 72 year old female with history of CHF, COPD, obesity hypoventilation syndrome who presents with new onset cough and dyspnea this morning. Review of systems is notable for increasing lower extremity swelling. Of note patient is noncompliant with medication regimen possibly secondary to financial issues.  Breathing has slowly improved but then on 5/1 developed right knee pain consistent with arthritis/gout.  Patient is considering SNF as well.     Assessment & Plan:   Principal Problem:   Acute on chronic respiratory failure with hypoxia (HCC) Active Problems:   Obesity hypoventilation syndrome (HCC)   Peripheral edema   Chronic pancreatitis (HCC)   Coronary artery disease, non-occlusive   Hypertensive heart disease   Dyslipidemia, goal LDL below 100   Morbid obesity with BMI of 50.0-59.9, adult (HCC)   Type II diabetes mellitus, uncontrolled ([place])   Seizure disorder (HCC)   OSA (obstructive sleep apnea) - not on CPAP   CHF exacerbation (HCC)   Acute hypoxic respiratory failure likely secondary toAcute on chronic diastolic CHF Class 2 in the setting of poor compliance to meds as well as obesity hypoventilation - Last 2D echo December 2018 EF 55 to 23%, grade 2 diastolic, normal systolic.Chest x-ray shows fluid overload, cardiomegaly, no infiltrates  Received Lasix 40 mg IV x1, with significant urine production.  -IV lasix converted to home dose; given extra dose today Continue Coreg at lower dose Down almost 5L -Cr stable - ECHO pending  Right knee pain (due to gout/arthritis) -xray shows severe 3 compartment arthritis -uric acid elevated like in a gout flare -treat with colchicine 1.2 x 1 then 0.6 daily -monitor for results   COPD exacerbation with wheezing, recent bronchitis, and OSA not on CPAP or  O2 at home  -per chart, unable to afford CPAP -nebs -home O2 eval   Type II Diabetes with neuropathy /uncontrolled -lantus/SSI  Hypertension BP  -BP improved with diuresis-- ? If patient actually taking meds at home -decrease coreg  Hyperlipidemia -home statin  History of Seizures (unknon type)  Continue present therapy with Lamictal   Chronic pancreatitis, no acute issues Continue Creon and Questran  Depression/ Parkinson's Continue Zoloft and SInemet  Obesity Body mass index is 51.6 kg/m.  Mild anemia -Fe panel with low ratios - IV Fe x 1 (Po iron causes constipation)    DVT prophylaxis:  SQ Heparin   Code Status: Full Code   Family Communication: None at bedside  Disposition Plan:  Home or SNF in AM if knee improved   Consultants:         Subjective: Breathing better. Knee better.   Objective: Vitals:   10/23/17 1936 10/24/17 0500 10/24/17 0510 10/24/17 1210  BP: (!) 101/44 (!) 113/52  (!) 110/52  Pulse: 63 66 66 63  Resp: 14 11 (!) 21 13  Temp: 99.6 F (37.6 C) 98.4 F (36.9 C)  98.2 F (36.8 C)  TempSrc: Oral Oral  Oral  SpO2: 95% 96% 96% 95%  Weight:   123.9 kg (273 lb 1.6 oz)   Height:        Intake/Output Summary (Last 24 hours) at 10/24/2017 1834 Last data filed at 10/24/2017 0800 Gross per 24 hour  Intake 75 ml  Output 101 ml  Net -26 ml   Filed Weights   10/22/17 0425 10/23/17 5573 10/24/17 0510  Weight: 119.6 kg (263 lb 11.2 oz) 123.2 kg (271 lb 8 oz) 123.9 kg (273 lb 1.6 oz)    Examination:  General exam: obese female sitting in chair Respiratory system: diminished, no wheezing, no increased work of breathing Gastrointestinal system: +BS, soft/obese Central nervous system: alert Pain with palpation of right knee-- increases with bending/movement     Data Reviewed: I have personally reviewed following labs and imaging studies  CBC: Recent Labs  Lab 10/20/17 1245 10/21/17 0824 10/22/17 0348  10/23/17 0257 10/24/17 0309  WBC 4.3 4.3 5.7 8.3 5.9  HGB 11.9* 11.8* 11.7* 11.5* 10.8*  HCT 37.9 38.6 37.7 36.7 34.7*  MCV 73.4* 75.2* 74.7* 74.3* 74.1*  PLT 192 186 191 187 443   Basic Metabolic Panel: Recent Labs  Lab 10/20/17 1144 10/20/17 1156 10/21/17 0824 10/22/17 0348 10/23/17 0257 10/24/17 0309  NA 136 136 139 139 136 132*  K 4.2 5.5* 4.1 3.3* 3.7 3.7  CL 99* 101 98* 95* 95* 93*  CO2 27  --  29 34* 33* 28  GLUCOSE 126* 128* 143* 113* 155* 119*  BUN 21* 31* 16 14 13 17   CREATININE 1.09* 1.10* 0.94 0.95 1.08* 1.24*  CALCIUM 9.0  --  8.9 8.9 8.8* 8.6*  MG  --   --   --  1.7  --   --    GFR: Estimated Creatinine Clearance: 52.9 mL/min (A) (by C-G formula based on SCr of 1.24 mg/dL (H)). Liver Function Tests: No results for input(s): AST, ALT, ALKPHOS, BILITOT, PROT, ALBUMIN in the last 168 hours. No results for input(s): LIPASE, AMYLASE in the last 168 hours. No results for input(s): AMMONIA in the last 168 hours. Coagulation Profile: No results for input(s): INR, PROTIME in the last 168 hours. Cardiac Enzymes: No results for input(s): CKTOTAL, CKMB, CKMBINDEX, TROPONINI in the last 168 hours. BNP (last 3 results) No results for input(s): PROBNP in the last 8760 hours. HbA1C: No results for input(s): HGBA1C in the last 72 hours. CBG: Recent Labs  Lab 10/23/17 1605 10/23/17 2034 10/24/17 0608 10/24/17 1155 10/24/17 1704  GLUCAP 157* 172* 111* 145* 135*   Lipid Profile: No results for input(s): CHOL, HDL, LDLCALC, TRIG, CHOLHDL, LDLDIRECT in the last 72 hours. Thyroid Function Tests: No results for input(s): TSH, T4TOTAL, FREET4, T3FREE, THYROIDAB in the last 72 hours. Anemia Panel: Recent Labs    10/23/17 0257  FERRITIN 40  TIBC 326  IRON 20*   Urine analysis:    Component Value Date/Time   COLORURINE YELLOW 06/23/2017 2047   APPEARANCEUR CLEAR 06/23/2017 2047   LABSPEC 1.018 06/23/2017 2047   PHURINE 6.0 06/23/2017 2047   GLUCOSEU >=500 (A)  06/23/2017 2047   GLUCOSEU NEGATIVE 04/17/2016 0955   HGBUR NEGATIVE 06/23/2017 2047   BILIRUBINUR NEGATIVE 06/23/2017 2047   KETONESUR NEGATIVE 06/23/2017 2047   PROTEINUR NEGATIVE 06/23/2017 2047   UROBILINOGEN 0.2 04/17/2016 0955   NITRITE NEGATIVE 06/23/2017 2047   LEUKOCYTESUR NEGATIVE 06/23/2017 2047     )No results found for this or any previous visit (from the past 240 hour(s)).    Anti-infectives (From admission, onward)   None       Radiology Studies: Dg Knee 1-2 Views Right  Result Date: 10/23/2017 CLINICAL DATA:  Sudden onset of pain and swelling today EXAM: RIGHT KNEE - 1-2 VIEW COMPARISON:  07/15/2012 FINDINGS: Osseous demineralization. Tricompartmental joint space narrowing and spur formation. No acute fracture, dislocation, or bone destruction. No knee joint effusion. IMPRESSION: Advanced tricompartmental osteoarthritic changes of  the RIGHT knee. No acute abnormalities. Electronically Signed   By: Lavonia Dana M.D.   On: 10/23/2017 13:18   Dg Chest Port 1 View  Result Date: 10/24/2017 CLINICAL DATA:  Pt had a new onset of productive cough today as well as increased SOB, pt states it has improved now - hx of CHF, htn, diabetes, seizures, PNA, COPD, dyspnea, Parkinson's , sleep apnea EXAM: PORTABLE CHEST 1 VIEW COMPARISON:  10/20/2017 FINDINGS: There is no focal parenchymal opacity. There is no pleural effusion or pneumothorax. There is stable cardiomegaly. The osseous structures are unremarkable. IMPRESSION: No active disease. Electronically Signed   By: Kathreen Devoid   On: 10/24/2017 13:48        Scheduled Meds: . atorvastatin  10 mg Oral Daily  . carbidopa-levodopa  1 tablet Oral TID  . carvedilol  12.5 mg Oral BID WC  . cholecalciferol  1,000 Units Oral Daily  . colchicine  0.6 mg Oral Daily  . diclofenac sodium  2 g Topical QID  . famotidine  20 mg Oral BID  . furosemide  80 mg Oral Once per day on Mon Wed Fri   And  . furosemide  40 mg Oral Once per day on  Sun Tue Thu Sat  . guaiFENesin  600 mg Oral BID  . heparin  5,000 Units Subcutaneous Q8H  . insulin aspart  0-9 Units Subcutaneous TID WC  . insulin glargine  30 Units Subcutaneous QHS  . isosorbide mononitrate  60 mg Oral Daily  . lamoTRIgine  100 mg Oral BID  . lipase/protease/amylase  48,000 Units Oral BID WC   And  . lipase/protease/amylase  24,000 Units Oral Q lunch  . metolazone  2.5 mg Oral Once per day on Sun Tue Thu Sat  . pregabalin  100 mg Oral BID  . sertraline  50 mg Oral QPM   Continuous Infusions:    LOS: 3 days    Time spent: 25 min    Elwin Mocha, MD [facility] Pager AMION  If 7PM-7AM, please contact night-coverage [email or web address] Password [facility] 10/24/2017, 6:34 PM

## 2017-10-24 NOTE — Care Management Note (Signed)
Case Management Note Marvetta Gibbons RN, BSN Unit 4E-Case Manager [phone]  Patient Details  Name: [name] MRN: [id number] Date of Birth: 03-04-1946  Subjective/Objective:   Pt admitted with acute on chronic HF                 Action/Plan: PTA pt lived at home, has home 02 at baseline and cpap, also has all other needed DME. hx-non-compliance, per PT /OT eval recommendations for SNF- CSW consulted for possible SNF placement. CM to follow for any further transition needs.   Expected Discharge Date:                  Expected Discharge Plan:  Skilled Nursing Facility  In-House Referral:  Clinical Social Work  Discharge planning Services  CM Consult  Post Acute Care Choice:    Choice offered to:     DME Arranged:    DME Agency:     HH Arranged:    [place] Agency:     Status of Service:  In process, will continue to follow  If discussed at Long Length of Stay Meetings, dates discussed:    Discharge Disposition:   Additional Comments:  Dawayne Patricia, RN 10/24/2017, 3:16 PM

## 2017-10-24 NOTE — Progress Notes (Signed)
Patient states that she does not wear a CPAP at night.

## 2017-10-24 NOTE — Progress Notes (Signed)
Physical Therapy Treatment Patient Details Name: [name] MRN: [id number] DOB: 10/23/1945 Today's Date: 10/24/2017    History of Present Illness 72 year old female with history of CHF, COPD, obesity hypoventilation syndrome who presents with new onset cough and dyspnea on 4/28. Review of systems is notable for increasing lower extremity swelling. Of note patient is noncompliant with medication regimen possibly secondary to financial issues    PT Comments    Pt needed to be cleaned up due to urinary incontinence and ready for back to bed. Continues to require significant 2 person assist and continue to recommend SNF.   Follow Up Recommendations  SNF     Equipment Recommendations  None recommended by PT    Recommendations for Other Services       Precautions / Restrictions Precautions Precautions: Fall Precaution Comments: monitor O2 sats  Restrictions Weight Bearing Restrictions: No    Mobility  Bed Mobility Overal bed mobility: Needs Assistance Bed Mobility: Sit to Supine     Supine to sit: +2 for physical assistance;Mod assist;HOB elevated Sit to supine: +2 for physical assistance;Max assist   General bed mobility comments: Assist to lower trunk and bring legs up into bed.  Transfers Overall transfer level: Needs assistance Equipment used: Rolling walker (2 wheeled) Transfers: Sit to/from [facility] Sit to Stand: +2 physical assistance;Mod assist Stand pivot transfers: +2 physical assistance;Mod assist       General transfer comment: Assist to bring hips up. Assist for pivotal steps from recliner to bsc. After using bsc stood with walker and removed bsc from behind pt and pulled bed up behind pt. Pt in flexed position throughout   Ambulation/Gait             General Gait Details: Pivotal steps for recliner to bsc.   Stairs             Wheelchair Mobility    Modified Rankin (Stroke Patients Only)       Balance  Overall balance assessment: Needs assistance Sitting-balance support: Feet supported Sitting balance-Leahy Scale: Good     Standing balance support: Bilateral upper extremity supported Standing balance-Leahy Scale: Poor Standing balance comment: walker and +2 min assist to maintain static standing                             Cognition Arousal/Alertness: Awake/alert Behavior During Therapy: WFL for tasks assessed/performed Overall Cognitive Status: Within Functional Limits for tasks assessed                                        Exercises      General Comments        Pertinent Vitals/Pain Pain Assessment: Faces Faces Pain Scale: Hurts even more Pain Location: lt hip and rt knee Pain Descriptors / Indicators: Grimacing;Sharp Pain Intervention(s): Limited activity within patient's tolerance;Monitored during session    Home Living                      Prior Function            PT Goals (current goals can now be found in the care plan section) Progress towards PT goals: Progressing toward goals    Frequency    Min 2X/week      PT Plan Current plan remains appropriate    Co-evaluation  AM-PAC PT "6 Clicks" Daily Activity  Outcome Measure  Difficulty turning over in bed (including adjusting bedclothes, sheets and blankets)?: Unable Difficulty moving from lying on back to sitting on the side of the bed? : Unable Difficulty sitting down on and standing up from a chair with arms (e.g., wheelchair, bedside commode, etc,.)?: Unable Help needed moving to and from a bed to chair (including a wheelchair)?: A Lot Help needed walking in hospital room?: Total Help needed climbing 3-5 steps with a railing? : Total 6 Click Score: 7    End of Session Equipment Utilized During Treatment: Gait belt;Oxygen Activity Tolerance: Patient limited by pain Patient left: with call bell/phone within reach;in bed;with  nursing/sitter in room Nurse Communication: Mobility status(Nurse tech assisted with mobility) PT Visit Diagnosis: Unsteadiness on feet (R26.81);Muscle weakness (generalized) (M62.81);History of falling (Z91.81);Difficulty in walking, not elsewhere classified (R26.2)     Time: 7793-9030 PT Time Calculation (min) (ACUTE ONLY): 28 min  Charges:  $Therapeutic Activity: 8-22 mins                    G Codes:       [facility] PT [place] 10/24/2017, 5:24 PM

## 2017-10-25 ENCOUNTER — Inpatient Hospital Stay (HOSPITAL_COMMUNITY): Payer: Medicare HMO

## 2017-10-25 DIAGNOSIS — I509 Heart failure, unspecified: Secondary | ICD-10-CM

## 2017-10-25 LAB — GLUCOSE, CAPILLARY
Glucose-Capillary: 103 mg/dL — ABNORMAL HIGH (ref 65–99)
Glucose-Capillary: 146 mg/dL — ABNORMAL HIGH (ref 65–99)
Glucose-Capillary: 146 mg/dL — ABNORMAL HIGH (ref 65–99)
Glucose-Capillary: 161 mg/dL — ABNORMAL HIGH (ref 65–99)

## 2017-10-25 LAB — BASIC METABOLIC PANEL
Anion gap: 8 (ref 5–15)
Anion gap: 10 (ref 5–15)
BUN: 21 mg/dL — ABNORMAL HIGH (ref 6–20)
BUN: 19 mg/dL (ref 6–20)
CO2: 34 mmol/L — ABNORMAL HIGH (ref 22–32)
CO2: 36 mmol/L — ABNORMAL HIGH (ref 22–32)
Calcium: 8.9 mg/dL (ref 8.9–10.3)
Calcium: 9.2 mg/dL (ref 8.9–10.3)
Chloride: 91 mmol/L — ABNORMAL LOW (ref 101–111)
Chloride: 87 mmol/L — ABNORMAL LOW (ref 101–111)
Creatinine, Ser: 1.1 mg/dL — ABNORMAL HIGH (ref 0.44–1.00)
Creatinine, Ser: 1.17 mg/dL — ABNORMAL HIGH (ref 0.44–1.00)
GFR calc Af Amer: 58 mL/min — ABNORMAL LOW (ref >60)
GFR calc Af Amer: 54 mL/min — ABNORMAL LOW (ref >60)
GFR calc non Af Amer: 50 mL/min — ABNORMAL LOW (ref >60)
GFR calc non Af Amer: 46 mL/min — ABNORMAL LOW (ref >60)
Glucose, Bld: 119 mg/dL — ABNORMAL HIGH (ref 65–99)
Glucose, Bld: 158 mg/dL — ABNORMAL HIGH (ref 65–99)
Potassium: 3.3 mmol/L — ABNORMAL LOW (ref 3.5–5.1)
Potassium: 3.5 mmol/L (ref 3.5–5.1)
Sodium: 133 mmol/L — ABNORMAL LOW (ref 135–145)
Sodium: 133 mmol/L — ABNORMAL LOW (ref 135–145)

## 2017-10-25 LAB — ECHOCARDIOGRAM LIMITED
Height: 61 in
Weight: 4338.65 oz

## 2017-10-25 LAB — MAGNESIUM: Magnesium: 1.7 mg/dL (ref 1.7–2.4)

## 2017-10-25 LAB — TROPONIN I: Troponin I: <0.03 ng/mL (ref <0.03)

## 2017-10-25 MED ORDER — HYDROCOD POLST-CPM POLST ER 10-8 MG/5ML PO SUER
5.0000 mL | Freq: Two times a day (BID) | ORAL | Status: DC | PRN
Start: 2017-10-25 — End: 2017-10-28
  Administered 2017-10-25 – 2017-10-27 (×4): 5 mL via ORAL
  Filled 2017-10-25 (×4): qty 5

## 2017-10-25 MED ORDER — DICLOFENAC SODIUM 1 % TD GEL: 2.0000 g | Freq: Four times a day (QID) | TRANSDERMAL | Status: DC

## 2017-10-25 MED ORDER — HYDROCOD POLST-CPM POLST ER 10-8 MG/5ML PO SUER: 5.0000 mL | Freq: Two times a day (BID) | ORAL | 0 refills | Status: DC | PRN

## 2017-10-25 MED ORDER — CARVEDILOL 12.5 MG PO TABS: 12.5000 mg | ORAL_TABLET | Freq: Two times a day (BID) | ORAL | Status: DC

## 2017-10-25 MED ORDER — POTASSIUM CHLORIDE CRYS ER 20 MEQ PO TBCR
40.0000 meq | EXTENDED_RELEASE_TABLET | Freq: Once | ORAL | Status: AC
  Administered 2017-10-25: 40 meq via ORAL
  Filled 2017-10-25: qty 2

## 2017-10-25 MED ORDER — IPRATROPIUM-ALBUTEROL 0.5-2.5 (3) MG/3ML IN SOLN: 3.0000 mL | RESPIRATORY_TRACT | Status: DC | PRN

## 2017-10-25 MED ORDER — MORPHINE SULFATE (PF) 2 MG/ML IV SOLN: 2.0000 mg | INTRAVENOUS | Status: DC | PRN

## 2017-10-25 NOTE — Progress Notes (Signed)
  Echocardiogram 2D Echocardiogram has been performed.  [name] F 10/25/2017, 1:32 PM

## 2017-10-25 NOTE — Progress Notes (Addendum)
CSW covering this afternoon- spoke with [facility] Rep they have still not gotten [place] and are unable to accept today without Karyl Kinnier to check in again tomorrow to see if Josem Kaufmann has been received  CSW left message for sister to inform  Jorge Ny, [place] Worker [phone]

## 2017-10-25 NOTE — Progress Notes (Signed)
Patient doing much better today. She was discharged to [facility] skilled nursing facility however insurance or Friday patient has not come in on time. She is expected to be transferred tomorrow.

## 2017-10-25 NOTE — Progress Notes (Signed)
Patient c/o of left sided chest pain at shift change. Patient stated that, "it felt like someone was standing on it, with some sharpness that went to her back". Patient did not make the day shift nurse aware that her chest has been hurting since she returned from Echo lab around lunch time. Patient's pain 8. EKG done. Tylenol given with little relief. Pain 7. Notified on call NP. Orders placed. Will continue to monitor

## 2017-10-25 NOTE — Progress Notes (Signed)
Received phone call from bedside RN regarding pt c/o chest pain on left side (7/10) since before shift change. Pt describes pain as something heavy on her chest that has had little relief with tylenol . She denies SOB, dizziness, N/V. VSS. ECG done and reviewed.Additional lab work ordered.   Chest Pain ECG, troponin, BMP, Mg ordered.  Morphine 2mg  IV ordered for chest discomfort.  Will f/u with bedside RN after completion of orders.  Advised RN to call NP on call if symptoms worsens or additional changes are noted

## 2017-10-25 NOTE — Progress Notes (Signed)
Pt has stated that she does not wear a CPAP at home.

## 2017-10-25 NOTE — Progress Notes (Signed)
Spoke w Caryl Pina CSW who states patient has bed at [facility], waiting on insurance auth, anticipate auth today.

## 2017-10-25 NOTE — Discharge Summary (Signed)
Physician Discharge Summary  [name] SRP:[id number] DOB: 07/06/1945 DOA: 10/20/2017  PCP: [name], MD  Admit date: 10/20/2017 Discharge date: 10/25/2017  Time spent: 37  minutes  Recommendations for Outpatient Follow-up:  1. Transfer to rehab at [facility]  2. Continue Nebulizer with other heart medications 3. Outpatient follow up with cardiology   Discharge Diagnoses:  Principal Problem:   Acute on chronic respiratory failure with hypoxia ([place]) Active Problems:   Obesity hypoventilation syndrome (HCC)   Peripheral edema   Chronic pancreatitis (HCC)   Coronary artery disease, non-occlusive   Hypertensive heart disease   Dyslipidemia, goal LDL below 100   Morbid obesity with BMI of 50.0-59.9, adult (HCC)   Type II diabetes mellitus, uncontrolled ([place])   Seizure disorder (HCC)   OSA (obstructive sleep apnea) - not on CPAP   CHF exacerbation ([place])   Discharge Condition: Fair  Diet recommendation: Carb modified and Heart healthy  Filed Weights   10/23/17 0616 10/24/17 0510 10/25/17 0416  Weight: 123.2 kg (271 lb 8 oz) 123.9 kg (273 lb 1.6 oz) 123 kg (271 lb 2.7 oz)    History of present illness:   [name] is a 72 y.o. female with medical history significant for hypertension, CHF, obesity hypoventilation syndrome, renal insufficiency, recent bronchitis about 3 weeks ago, presenting to the emergency department, with increasing shortness of breath and cough since this morning, when she was bathing, accompanied by clear sputum cough and increased lower extremity swelling left greater than right.  The patient waited about 2 hours before calling her sister to call EMS.  She was coughing at the point of gagging.  She felt her her throat was closing.  She denies any wheezing, although she reports that her sister does "hear her wheezing at times ".  The patient is not always compliant with her medications, as she is unable to afford them. She does admit to taking  them last night.  EMS she was found to be hypoxic, and was placed on nasal cannula, with good response.  Of note, her sister states the patient is supposed to be on oxygen and on CPAP at home, but she is unable to afford them either.    Hospital Course:  Patient was admitted and started on IV lasix and supportive care. Has had Echo in December showing normal EF. Diastolic dysfunction mainly. Patient has improved and transitioned to home regimen of Lasix. Carvedilol dose reduced from 25 mg to 12.5 mg BID. Has also been initiated on Duoneb as well as as Tele monitoring. Patient has OSA not on CPAP at home but will need social worker input to acquire CPAP. She may need home oxygen ultimately.  Other medical issues include Gout with Falre in the right knee as well as COPD exacerbation, DM2 as well as Morbid obesity all stable. She has Chronic pancreatitis on Creon. Seizure disorder on Lamictal and parkinson' s disease. Patient continues to cough. Will need SNF for rehab and has been accepted at [facility].  Procedures:  X ray of right knee (i.e. Studies not automatically included, echos, thoracentesis, etc; not x-rays)  Consultations:  None  Discharge Exam: Vitals:   10/25/17 0416 10/25/17 1142  BP: 111/61 (!) 98/51  Pulse: 60 62  Resp: 16 16  Temp: 98.7 F (37.1 C) 98.6 F (37 C)  SpO2: 94% 91%    General: Stable, NAD Cardiovascular: RRR Respiratory: Mild basal crackles, Good AE Bilaterally  Discharge Instructions   Discharge Instructions    Diet -  low sodium heart healthy   Complete by:  As directed    Increase activity slowly   Complete by:  As directed      Allergies as of 10/25/2017      Reactions   Penicillins Hives   Has patient had a PCN reaction causing immediate rash, facial/tongue/throat swelling, SOB or lightheadedness with hypotension: Yes Has patient had a PCN reaction causing severe rash involving mucus membranes or skin necrosis: No Has patient had a PCN  reaction that required hospitalization: No Has patient had a PCN reaction occurring within the last 10 years: No If all of the above answers are "NO", then may proceed with Cephalosporin use.   Sulfa Antibiotics Hives   Aspirin Other (See Comments)    On aspirin 81 mg - Rectal bleeding in dec 2018   Codeine    Headache and makes the patient feel "off"   Other Rash   Bleach      Medication List    STOP taking these medications   predniSONE 20 MG tablet Commonly known as:  DELTASONE     TAKE these medications   ACCU-CHEK NANO SMARTVIEW w/Device Kit Use to check blood sugar 3 times per day dx code E11.65   acetaminophen 500 MG tablet Commonly known as:  TYLENOL Take 500-1,000 mg by mouth every 4 (four) hours as needed (for pain).   atorvastatin 10 MG tablet Commonly known as:  LIPITOR Take 10 mg by mouth daily.   B-complex with vitamin C tablet Take 1 tablet by mouth daily.   carbidopa-levodopa 25-100 MG tablet Commonly known as:  SINEMET IR Take 1 tablet by mouth 3 (three) times daily.   carvedilol 12.5 MG tablet Commonly known as:  COREG Take 1 tablet (12.5 mg total) by mouth 2 (two) times daily with a meal. What changed:    medication strength  how much to take   chlorpheniramine-HYDROcodone 10-8 MG/5ML Suer Commonly known as:  TUSSIONEX Take 5 mLs by mouth every 12 (twelve) hours as needed for cough.   cholestyramine 4 g packet Commonly known as:  QUESTRAN Take 4 g by mouth 2 (two) times daily as needed (for diarrhea).   diclofenac sodium 1 % Gel Commonly known as:  VOLTAREN Apply 2 g topically 4 (four) times daily.   furosemide 40 MG tablet Commonly known as:  LASIX Take 40 mg ( 1 tablet) by mouth on SUN,TUES,WED., THUR.,SAT. And take 80 mg th other days What changed:    how much to take  how to take this  when to take this  additional instructions   Insulin Glargine 300 UNIT/ML Sopn Commonly known as:  TOUJEO SOLOSTAR Inject 40 Units into  the skin daily. What changed:    how much to take  when to take this   Insulin Pen Needle 31G X 8 MM Misc Use to inject medication 4 times daily   ipratropium-albuterol 0.5-2.5 (3) MG/3ML Soln Commonly known as:  DUONEB Take 3 mLs by nebulization every 4 (four) hours as needed.   isosorbide mononitrate 60 MG 24 hr tablet Commonly known as:  IMDUR Take 1 tablet (60 mg total) by mouth daily.   lamoTRIgine 100 MG tablet Commonly known as:  LAMICTAL Take 1 tablet (100 mg total) by mouth 2 (two) times daily.   Menthol (Topical Analgesic) 3.5 % Gel Apply 1 application topically as needed (for neuropathy in feet).   metolazone 2.5 MG tablet Commonly known as:  ZAROXOLYN Take 1 tablet (2.5 mg total) by  mouth as directed. What changed:    when to take this  reasons to take this   multivitamin with minerals tablet Take 1 tablet by mouth daily.   OZEMPIC 0.25 or 0.5 MG/DOSE Sopn Generic drug:  Semaglutide Inject 0.5 mg as directed once a week. Marland Kitchen025 for 4 weeks and then do .05 for continuous   Pancrelipase (Lip-Prot-Amyl) 25000 units Cpep Take 25,000-50,000 Units by mouth See admin instructions. 50,000 units with breakfast then 25,000 units with lunch then 50,000 units with dinner (evening meal)   potassium chloride SA 20 MEQ tablet Commonly known as:  K-DUR,KLOR-CON Take 2 tablets (40 mEq total) by mouth daily. On the day you take metolazone, take an extra 20 meq (60 meq total)   pregabalin 100 MG capsule Commonly known as:  LYRICA Take 100 mg by mouth 2 (two) times daily.   sertraline 50 MG tablet Commonly known as:  ZOLOFT Take 50 mg by mouth every evening.   Vitamin D-3 1000 units Caps Take 1,000 Units by mouth daily.      Allergies  Allergen Reactions  . Penicillins Hives    Has patient had a PCN reaction causing immediate rash, facial/tongue/throat swelling, SOB or lightheadedness with hypotension: Yes Has patient had a PCN reaction causing severe rash  involving mucus membranes or skin necrosis: No Has patient had a PCN reaction that required hospitalization: No Has patient had a PCN reaction occurring within the last 10 years: No If all of the above answers are "NO", then may proceed with Cephalosporin use.   . Sulfa Antibiotics Hives  . Aspirin Other (See Comments)     On aspirin 81 mg - Rectal bleeding in dec 2018  . Codeine     Headache and makes the patient feel "off"  . Other Rash    Bleach   Contact information for after-discharge care    Destination    [facility] .   Service:  Skilled Nursing Contact information: [address] [phone]               The results of significant diagnostics from this hospitalization (including imaging, microbiology, ancillary and laboratory) are listed below for reference.    Significant Diagnostic Studies: Dg Chest 2 View  Result Date: 10/20/2017 CLINICAL DATA:  Productive cough EXAM: CHEST - 2 VIEW COMPARISON:  06/23/2017 FINDINGS: The heart is moderately enlarged. Low lung volumes. Bibasilar atelectasis. No pneumothorax or pleural effusion. No interstitial edema. IMPRESSION: Cardiomegaly Bibasilar atelectasis. Electronically Signed   By: Marybelle Killings M.D.   On: 10/20/2017 12:24   Dg Knee 1-2 Views Right  Result Date: 10/23/2017 CLINICAL DATA:  Sudden onset of pain and swelling today EXAM: RIGHT KNEE - 1-2 VIEW COMPARISON:  07/15/2012 FINDINGS: Osseous demineralization. Tricompartmental joint space narrowing and spur formation. No acute fracture, dislocation, or bone destruction. No knee joint effusion. IMPRESSION: Advanced tricompartmental osteoarthritic changes of the RIGHT knee. No acute abnormalities. Electronically Signed   By: Lavonia Dana M.D.   On: 10/23/2017 13:18   Dg Chest Port 1 View  Result Date: 10/24/2017 CLINICAL DATA:  Pt had a new onset of productive cough today as well as increased SOB, pt states it  has improved now - hx of CHF, htn, diabetes, seizures, PNA, COPD, dyspnea, Parkinson's , sleep apnea EXAM: PORTABLE CHEST 1 VIEW COMPARISON:  10/20/2017 FINDINGS: There is no focal parenchymal opacity. There is no pleural effusion or pneumothorax. There is stable cardiomegaly. The osseous  structures are unremarkable. IMPRESSION: No active disease. Electronically Signed   By: Kathreen Devoid   On: 10/24/2017 13:48    Microbiology: No results found for this or any previous visit (from the past 240 hour(s)).   Labs: Basic Metabolic Panel: Recent Labs  Lab 10/21/17 0824 10/22/17 0348 10/23/17 0257 10/24/17 0309 10/24/17 1849 10/25/17 0434  NA 139 139 136 132*  --  133*  K 4.1 3.3* 3.7 3.7  --  3.3*  CL 98* 95* 95* 93*  --  91*  CO2 29 34* 33* 28  --  34*  GLUCOSE 143* 113* 155* 119*  --  119*  BUN _0 --  21*  CREATININE 0.94 0.95 1.08* 1.24*  --  1.10*  CALCIUM 8.9 8.9 8.8* 8.6*  --  8.9  MG  --  1.7  --   --  1.8  --   PHOS  --   --   --   --  3.7  --    Liver Function Tests: No results for input(s): AST, ALT, ALKPHOS, BILITOT, PROT, ALBUMIN in the last 168 hours. No results for input(s): LIPASE, AMYLASE in the last 168 hours. No results for input(s): AMMONIA in the last 168 hours. CBC: Recent Labs  Lab 10/20/17 1245 10/21/17 0824 10/22/17 0348 10/23/17 0257 10/24/17 0309  WBC 4.3 4.3 5.7 8.3 5.9  HGB 11.9* 11.8* 11.7* 11.5* 10.8*  HCT 37.9 38.6 37.7 36.7 34.7*  MCV 73.4* 75.2* 74.7* 74.3* 74.1*  PLT 192 186 191 187 200   Cardiac Enzymes: No results for input(s): CKTOTAL, CKMB, CKMBINDEX, TROPONINI in the last 168 hours. BNP: BNP (last 3 results) Recent Labs    06/18/17 0558 06/23/17 2050 10/20/17 1244  BNP 175.4* 69.9 274.3*    ProBNP (last 3 results) No results for input(s): PROBNP in the last 8760 hours.  CBG: Recent Labs  Lab 10/24/17 0608 10/24/17 1155 10/24/17 1704 10/24/17 2146 10/25/17 0622  GLUCAP 111* 145* 135* 138* 103*        SignedBarbette Merino MD.  [facility] 10/25/2017, 12:53 PM

## 2017-10-25 NOTE — Progress Notes (Signed)
Physical Therapy Treatment Patient Details Name: [name] MRN: [id number] DOB: [birth year] Today's Date: 10/25/2017    History of Present Illness 72 year old female with history of CHF, COPD, obesity hypoventilation syndrome who presents with new onset cough and dyspnea on 4/28. Review of systems is notable for increasing lower extremity swelling. Of note patient is noncompliant with medication regimen possibly secondary to financial issues    PT Comments    Pt making slow, steady progress. However pt continues to need ST-SNF due to needing significant assist of 2 people to mobilize.   Follow Up Recommendations  SNF     Equipment Recommendations  None recommended by PT    Recommendations for Other Services       Precautions / Restrictions Precautions Precautions: Fall Precaution Comments: monitor O2 sats  Restrictions Weight Bearing Restrictions: No    Mobility  Bed Mobility Overal bed mobility: Needs Assistance Bed Mobility: Sit to Supine     Supine to sit: Mod assist     General bed mobility comments: Assist to bring legs off bed and elevate trunk into sitting.  Transfers Overall transfer level: Needs assistance Equipment used: Rolling walker (2 wheeled) Transfers: Sit to/from Stand Sit to Stand: +2 physical assistance;Mod assist         General transfer comment: Assist to bring hips up and incr time to rise  Ambulation/Gait Ambulation/Gait assistance: Min assist;+2 safety/equipment Ambulation Distance (Feet): 5 Feet Assistive device: Rolling walker (2 wheeled) Gait Pattern/deviations: Step-to pattern;Decreased step length - right;Decreased step length - left;Shuffle;Wide base of support Gait velocity: decr Gait velocity interpretation: <1.31 ft/sec, indicative of household ambulator General Gait Details: Assist for balance and support.   Stairs             Wheelchair Mobility    Modified Rankin (Stroke Patients Only)       Balance  Overall balance assessment: Needs assistance Sitting-balance support: Feet supported Sitting balance-Leahy Scale: Good     Standing balance support: Bilateral upper extremity supported Standing balance-Leahy Scale: Poor Standing balance comment: walker and min assist for static standing                            Cognition Arousal/Alertness: Awake/alert Behavior During Therapy: WFL for tasks assessed/performed Overall Cognitive Status: Within Functional Limits for tasks assessed                                        Exercises      General Comments        Pertinent Vitals/Pain Pain Assessment: Faces Faces Pain Scale: Hurts little more Pain Location: rt knee Pain Descriptors / Indicators: Grimacing Pain Intervention(s): Limited activity within patient's tolerance;Monitored during session    Home Living                      Prior Function            PT Goals (current goals can now be found in the care plan section) Progress towards PT goals: Progressing toward goals    Frequency    Min 2X/week      PT Plan Current plan remains appropriate    Co-evaluation              AM-PAC PT "6 Clicks" Daily Activity  Outcome Measure  Difficulty turning over in bed (including adjusting bedclothes,  sheets and blankets)?: Unable Difficulty moving from lying on back to sitting on the side of the bed? : Unable Difficulty sitting down on and standing up from a chair with arms (e.g., wheelchair, bedside commode, etc,.)?: Unable Help needed moving to and from a bed to chair (including a wheelchair)?: A Lot Help needed walking in hospital room?: A Lot Help needed climbing 3-5 steps with a railing? : Total 6 Click Score: 8    End of Session Equipment Utilized During Treatment: Gait belt;Oxygen Activity Tolerance: Patient limited by pain Patient left: with call bell/phone within reach;with nursing/sitter in room;in chair Nurse  Communication: Mobility status(Nurse tech assisted with mobility) PT Visit Diagnosis: Unsteadiness on feet (R26.81);Muscle weakness (generalized) (M62.81);History of falling (Z91.81);Difficulty in walking, not elsewhere classified (R26.2)     Time: 1007-1219 PT Time Calculation (min) (ACUTE ONLY): 17 min  Charges:  $Gait Training: 8-22 mins                    G Codes:       [facility] [place] 10/25/2017, 11:46 AM

## 2017-10-25 NOTE — Progress Notes (Signed)
I came to do the echo but the patient just got in the chair and the bed was stripped.

## 2017-10-25 NOTE — Care Management Important Message (Signed)
Important Message  Patient Details  Name: [name] MRN: [id number] Date of Birth: 12/27/1945   Medicare Important Message Given:  Yes    Paxton Binns P Charlane Westry 10/25/2017, 2:58 PM

## 2017-10-26 DIAGNOSIS — I509 Heart failure, unspecified: Secondary | ICD-10-CM

## 2017-10-26 DIAGNOSIS — K861 Other chronic pancreatitis: Secondary | ICD-10-CM

## 2017-10-26 DIAGNOSIS — E785 Hyperlipidemia, unspecified: Secondary | ICD-10-CM

## 2017-10-26 DIAGNOSIS — R0602 Shortness of breath: Secondary | ICD-10-CM

## 2017-10-26 DIAGNOSIS — J9621 Acute and chronic respiratory failure with hypoxia: Secondary | ICD-10-CM

## 2017-10-26 DIAGNOSIS — E1165 Type 2 diabetes mellitus with hyperglycemia: Secondary | ICD-10-CM

## 2017-10-26 LAB — GLUCOSE, CAPILLARY
Glucose-Capillary: 114 mg/dL — ABNORMAL HIGH (ref 65–99)
Glucose-Capillary: 124 mg/dL — ABNORMAL HIGH (ref 65–99)
Glucose-Capillary: 201 mg/dL — ABNORMAL HIGH (ref 65–99)
Glucose-Capillary: 125 mg/dL — ABNORMAL HIGH (ref 65–99)

## 2017-10-26 MED ORDER — BENZONATATE 100 MG PO CAPS
100.0000 mg | ORAL_CAPSULE | Freq: Two times a day (BID) | ORAL | Status: DC | PRN
  Administered 2017-10-27: 100 mg via ORAL
  Filled 2017-10-26: qty 1

## 2017-10-26 NOTE — Plan of Care (Signed)
Care plans reviewed and patient is progressing.  

## 2017-10-26 NOTE — Plan of Care (Signed)
  Problem: Education: Goal: Knowledge of General Education information will improve Outcome: Progressing   Problem: Clinical Measurements: Goal: Will remain free from infection Outcome: Progressing   Problem: Elimination: Goal: Will not experience complications related to bowel motility Outcome: Progressing   Problem: Pain Managment: Goal: General experience of comfort will improve Outcome: Progressing   Problem: Activity: Goal: Risk for activity intolerance will decrease Outcome: Not Progressing

## 2017-10-26 NOTE — Social Work (Signed)
CSW spoke with SNF, they have not received insurance authorization for pt to discharge to [facility] today.  [facility] is closed over the weekend, likely Monday. MD aware.  Alexander Mt, [place] Work [phone]

## 2017-10-26 NOTE — Progress Notes (Signed)
PROGRESS NOTE  MAJEL GIEL YOV:[id number] DOB: 01-Apr-1946 DOA: 10/20/2017 PCP: [name], MD  HPI  [name] is a 72 y.o. year old female with medical history significant for CHF, COPD, obesity hypoventilation syndrome who presented on 10/20/2017 with cough and dyspnea, lower leg swelling and was found to have acute hypoxic respiratory failure secondary to combined CHF/COPD exacerbation,.  Interval History No acute events overnight   ROS:    Subjective No complaints  Assessment/Plan: Principal Problem:   Acute on chronic respiratory failure with hypoxia (HCC) Active Problems:   Obesity hypoventilation syndrome (HCC)   Peripheral edema   Chronic pancreatitis (HCC)   Coronary artery disease, non-occlusive   Hypertensive heart disease   Dyslipidemia, goal LDL below 100   Morbid obesity with BMI of 50.0-59.9, adult (HCC)   Type II diabetes mellitus, uncontrolled (HCC)   Seizure disorder (HCC)   OSA (obstructive sleep apnea) - not on CPAP   CHF exacerbation ([place])  1. Acute hypoxic respiratory failure, multifactorial etiology stable.  Suspect combined CHF/ COPD exacerbation in setting of obesity hypoventilation syndrome with poor outpatient medication adherence.  Wean oxygen goal SPO2 greater than 80%.  Currently only requiring half a liter at rest  2.  Acute on chronic CHFpEF exacerbation, improving  Previous TTE showed grade 2 diastolic dysfunction.  Repeat shows preserved EF and no diastolic dysfunction.  Elevated BNP, chest x-ray with cardiomegaly and bibasilar atelectasis, significant improvement with IV Lasix. Weight down to 265.  Continue home Lasix 80 mg MWF, 40 mg TTS, and metolazone 2.5 mg TTS, strict I's and O's, daily weights  3.  COPD exacerbation, improving.  Continue PRN duo nebs, supportive care with IC, flutter valve,  4.  OSA, not on CPAP at home  5.  Gout flare, right knee pain.  Colchicine 1.2 x 1, now on daily regimen, x-ray knee showed severe  3 compartment OA. States knee better and able to move more. Still some swelling and redness ( but no effusion on XR on 5/1). Today will be last day of colchicine given improvement  6.  Type 2 diabetes with peripheral neuropathy. A1c 7.1, continue Lantus 30 units, Lyrica 100 g twice daily  7.  Chronic anemia.  Iron panel consistent with iron deficiency.  Unable to tolerate oral iron due to constipation.  Status post one-time dose of IV iron during hospitalization.  8.  Hypertension, stable.  Continue reduced home dose of Coreg 12.5 mg twice daily and [place]  9.  Hyperlipidemia, stable continue statin  10.  History of seizures, stable.  Continue Lamictal  11.  Chronic pancreatitis, stable.  Continue home Creon question  12.  Depression/Parkinson's, stable continue Zoloft Sinemet  13.  Obesity, BMI 51.6.  Code Status: Full code  Family Communication: Call sister later this afternoon  Disposition Plan: Discharge to SNF, medically stable   Consultants:  None  Procedures:  TTE, 5/3  Antimicrobials:  None  Cultures:  None  Telemetry: No  DVT prophylaxis: Heparin   Objective: Vitals:   10/25/17 1750 10/25/17 1935 10/26/17 0409 10/26/17 1130  BP: (!) 101/42 (!) 142/75  128/62  Pulse: 61 63 88 (!) 57  Resp: 15 17 (!) 21   Temp: 97.8 F (36.6 C) 97.9 F (36.6 C) 98 F (36.7 C) 97.9 F (36.6 C)  TempSrc: Oral Oral Oral Oral  SpO2:  95% 96% 92%  Weight:   120.3 kg (265 lb 3.4 oz)   Height:  Intake/Output Summary (Last 24 hours) at 10/26/2017 1256 Last data filed at 10/26/2017 0650 Gross per 24 hour  Intake 240 ml  Output 3000 ml  Net -2760 ml   Filed Weights   10/24/17 0510 10/25/17 0416 10/26/17 0409  Weight: 123.9 kg (273 lb 1.6 oz) 123 kg (271 lb 2.7 oz) 120.3 kg (265 lb 3.4 oz)    Exam:  Constitutional:normal appearing female Eyes: EOMI, anicteric, normal conjunctivae ENMT: Oropharynx with moist mucous membranes, normal  dentition Cardiovascular: RRR no MRGs, with no peripheral edema Respiratory: Normal respiratory effort was 0.5 L nasal cannula, clear breath sounds  Abdomen: Soft,non-tender, with no HSM MSK: right knee tenderness with movement mainly in anterior compartment, but still mobile Skin: No rash ulcers, or lesions. Without skin tenting  Neurologic: Grossly no focal neuro deficit. Psychiatric:Appropriate affect, and mood. Mental status AAOx3  Data Reviewed: CBC: Recent Labs  Lab 10/20/17 1245 10/21/17 0824 10/22/17 0348 10/23/17 0257 10/24/17 0309  WBC 4.3 4.3 5.7 8.3 5.9  HGB 11.9* 11.8* 11.7* 11.5* 10.8*  HCT 37.9 38.6 37.7 36.7 34.7*  MCV 73.4* 75.2* 74.7* 74.3* 74.1*  PLT 192 186 191 187 606   Basic Metabolic Panel: Recent Labs  Lab 10/22/17 0348 10/23/17 0257 10/24/17 0309 10/24/17 1849 10/25/17 0434 10/25/17 2209  NA 139 136 132*  --  133* 133*  K 3.3* 3.7 3.7  --  3.3* 3.5  CL 95* 95* 93*  --  91* 87*  CO2 34* 33* 28  --  34* 36*  GLUCOSE 113* 155* 119*  --  119* 158*  BUN 14 13 17   --  21* 19  CREATININE 0.95 1.08* 1.24*  --  1.10* 1.17*  CALCIUM 8.9 8.8* 8.6*  --  8.9 9.2  MG 1.7  --   --  1.8  --  1.7  PHOS  --   --   --  3.7  --   --    GFR: Estimated Creatinine Clearance: 55 mL/min (A) (by C-G formula based on SCr of 1.17 mg/dL (H)). Liver Function Tests: No results for input(s): AST, ALT, ALKPHOS, BILITOT, PROT, ALBUMIN in the last 168 hours. No results for input(s): LIPASE, AMYLASE in the last 168 hours. No results for input(s): AMMONIA in the last 168 hours. Coagulation Profile: No results for input(s): INR, PROTIME in the last 168 hours. Cardiac Enzymes: Recent Labs  Lab 10/25/17 2209  TROPONINI <0.03   BNP (last 3 results) No results for input(s): PROBNP in the last 8760 hours. HbA1C: No results for input(s): HGBA1C in the last 72 hours. CBG: Recent Labs  Lab 10/25/17 1357 10/25/17 1625 10/25/17 2106 10/26/17 0632 10/26/17 1127  GLUCAP  146* 146* 161* 114* 124*   Lipid Profile: No results for input(s): CHOL, HDL, LDLCALC, TRIG, CHOLHDL, LDLDIRECT in the last 72 hours. Thyroid Function Tests: No results for input(s): TSH, T4TOTAL, FREET4, T3FREE, THYROIDAB in the last 72 hours. Anemia Panel: No results for input(s): VITAMINB12, FOLATE, FERRITIN, TIBC, IRON, RETICCTPCT in the last 72 hours. Urine analysis:    Component Value Date/Time   COLORURINE YELLOW 06/23/2017 2047   APPEARANCEUR CLEAR 06/23/2017 2047   LABSPEC 1.018 06/23/2017 2047   PHURINE 6.0 06/23/2017 2047   GLUCOSEU >=500 (A) 06/23/2017 2047   GLUCOSEU NEGATIVE 04/17/2016 [place] 06/23/2017 2047   BILIRUBINUR NEGATIVE 06/23/2017 2047   [place] 06/23/2017 2047   PROTEINUR NEGATIVE 06/23/2017 2047   UROBILINOGEN 0.2 04/17/2016 0955   NITRITE NEGATIVE 06/23/2017 2047  LEUKOCYTESUR NEGATIVE 06/23/2017 2047   Sepsis Labs: @LABRCNTIP (procalcitonin:4,lacticidven:4)  )No results found for this or any previous visit (from the past 240 hour(s)).    Studies: No results found.  Scheduled Meds: . atorvastatin  10 mg Oral Daily  . carbidopa-levodopa  1 tablet Oral TID  . carvedilol  12.5 mg Oral BID WC  . cholecalciferol  1,000 Units Oral Daily  . colchicine  0.6 mg Oral Daily  . diclofenac sodium  2 g Topical QID  . famotidine  20 mg Oral BID  . furosemide  80 mg Oral Once per day on Mon Wed Fri   And  . furosemide  40 mg Oral Once per day on Sun Tue Thu Sat  . guaiFENesin  600 mg Oral BID  . heparin  5,000 Units Subcutaneous Q8H  . insulin aspart  0-9 Units Subcutaneous TID WC  . insulin glargine  30 Units Subcutaneous QHS  . isosorbide mononitrate  60 mg Oral Daily  . lamoTRIgine  100 mg Oral BID  . lipase/protease/amylase  48,000 Units Oral BID WC   And  . lipase/protease/amylase  24,000 Units Oral Q lunch  . metolazone  2.5 mg Oral Once per day on Sun Tue Thu Sat  . pregabalin  100 mg Oral BID  . sertraline  50 mg  Oral QPM    Continuous Infusions:   LOS: 5 days     Desiree Hane, MD [facility] Pager [phone]  If 7PM-7AM, please contact night-coverage [email or web address] Password [email or web address] 10/26/2017, 12:56 PM

## 2017-10-27 LAB — GLUCOSE, CAPILLARY
Glucose-Capillary: 171 mg/dL — ABNORMAL HIGH (ref 65–99)
Glucose-Capillary: 117 mg/dL — ABNORMAL HIGH (ref 65–99)
Glucose-Capillary: 225 mg/dL — ABNORMAL HIGH (ref 65–99)
Glucose-Capillary: 216 mg/dL — ABNORMAL HIGH (ref 65–99)

## 2017-10-27 NOTE — Progress Notes (Signed)
No CPAP in room for patient.  Pt stated that she does not wear at home- pt resting comfortably, no distress.  Will monitor progress.

## 2017-10-27 NOTE — Progress Notes (Signed)
I came in the room to offer the patient a bath as I did not see any hygiene charted for a bath. Patient reported night shift tech helped her wash up/bathe this morning around 5am. I asked her if her sheets were changed etc, and said her gown and bed pads were changed when she had her wash up.

## 2017-10-27 NOTE — Progress Notes (Signed)
PROGRESS NOTE  [name] HAL:[id number] DOB: 1946/04/23 DOA: 10/20/2017 PCP: [name], MD  HPI  [name] is a 72 y.o. year old female with medical history significant for CHF, COPD, obesity hypoventilation syndrome who presented on 10/20/2017 with cough and dyspnea, lower leg swelling and was found to have acute hypoxic respiratory failure secondary to combined CHF/COPD exacerbation,.  Interval History No acute events overnight   ROS: No fevers or chills, no chest pain    Subjective Still with cough but better.  Reports improvement in right knee pain.  Started using planovalgus morning  Assessment/Plan: Principal Problem:   Acute on chronic respiratory failure with hypoxia (HCC) Active Problems:   Obesity hypoventilation syndrome (HCC)   Peripheral edema   Chronic pancreatitis (HCC)   Coronary artery disease, non-occlusive   Hypertensive heart disease   Dyslipidemia, goal LDL below 100   Morbid obesity with BMI of 50.0-59.9, adult (HCC)   Type II diabetes mellitus, uncontrolled ([place])   Seizure disorder (HCC)   OSA (obstructive sleep apnea) - not on CPAP   CHF exacerbation ([place])  1. Acute hypoxic respiratory failure, multifactorial etiology, improved.  Suspect combined CHF/ COPD exacerbation in setting of obesity hypoventilation syndrome with poor outpatient medication adherence.  Currently on room air.  Continue supportive care with flutter valve, since spirometry, Tessalon Perles  2.  Acute on chronic CHFpEF exacerbation, improved  Previous TTE showed grade 2 diastolic dysfunction.  Repeat here shows preserved EF and no diastolic dysfunction.  Elevated BNP, chest x-ray with cardiomegaly and bibasilar atelectasis, significant improvement with IV Lasix  during hospitalization. Weight down to 260, Net negative 8L.  Continue home Lasix 80 mg MWF, 40 mg TTS, and metolazone 2.5 mg TTS, strict I's and O's, daily weights  3.  COPD exacerbation, improved.  Continue  PRN duo nebs, supportive care with IS, flutter valve, longer requiring oxygen  4.  OSA, not on CPAP at home  5.  Gout flare, right knee pain, greatly improved.  Colchicine 1.2 x 1,completed maintenance regimen, x-ray knee showed severe 3 compartment OA. Mild swelling and r minimal redness ( but no effusion on XR on 5/1). Able to move with minimum pain.  6.  Type 2 diabetes with peripheral neuropathy. A1c 7.1, continue Lantus 30 units, Lyrica 100 g twice daily  7.  Chronic anemia.  Iron panel consistent with iron deficiency.  Unable to tolerate oral iron due to constipation.  Status post one-time dose of IV iron during hospitalization.  8.  Hypertension, stable.  Continue reduced home dose of Coreg 12.5 mg twice daily and [place]  9.  Hyperlipidemia, stable continue statin  10.  History of seizures, stable.  Continue Lamictal  11.  Chronic pancreatitis, stable.  Continue home Creon question  12.  Depression/Parkinson's, stable continue Zoloft Sinemet  13.  Obesity, BMI 51.6.  Code Status: Full code  Family Communication: Will call sister later this afternoon  Disposition Plan: Discharge to SNF awaiting insurance auth, medically stable   Consultants:  None  Procedures:  TTE, 5/3  Antimicrobials:  None  Cultures:  None  Telemetry: No  DVT prophylaxis: Heparin   Objective: Vitals:   10/26/17 2057 10/27/17 0450 10/27/17 0528 10/27/17 0722  BP: (!) 111/49   (!) 147/67  Pulse: 62     Resp: 19     Temp: 98.4 F (36.9 C) 98.5 F (36.9 C)  97.7 F (36.5 C)  TempSrc: Oral Oral  Oral  SpO2: 92%   93%  Weight:   118.3 kg (260 lb 12.8 oz)   Height:        Intake/Output Summary (Last 24 hours) at 10/27/2017 1440 Last data filed at 10/27/2017 1018 Gross per 24 hour  Intake 360 ml  Output 600 ml  Net -240 ml   Filed Weights   10/25/17 0416 10/26/17 0409 10/27/17 0528  Weight: 123 kg (271 lb 2.7 oz) 120.3 kg (265 lb 3.4 oz) 118.3 kg (260 lb 12.8 oz)     Exam:  Constitutional:normal appearing, obese female Eyes: EOMI, anicteric, normal conjunctivae ENMT: Oropharynx with moist mucous membranes, normal dentition Cardiovascular: RRR no MRGs, with no peripheral edema Respiratory: Normal respiratory effort on room air, clear breath sounds  MSK: right knee minimal tenderness with palpation, full range of motion.  Skin: No rash ulcers, or lesions. Without skin tenting  Neurologic: Grossly no focal neuro deficit. Psychiatric:Appropriate affect, and mood. Mental status AAOx3  Data Reviewed: CBC: Recent Labs  Lab 10/21/17 0824 10/22/17 0348 10/23/17 0257 10/24/17 0309  WBC 4.3 5.7 8.3 5.9  HGB 11.8* 11.7* 11.5* 10.8*  HCT 38.6 37.7 36.7 34.7*  MCV 75.2* 74.7* 74.3* 74.1*  PLT 186 191 187 237   Basic Metabolic Panel: Recent Labs  Lab 10/22/17 0348 10/23/17 0257 10/24/17 0309 10/24/17 1849 10/25/17 0434 10/25/17 2209  NA 139 136 132*  --  133* 133*  K 3.3* 3.7 3.7  --  3.3* 3.5  CL 95* 95* 93*  --  91* 87*  CO2 34* 33* 28  --  34* 36*  GLUCOSE 113* 155* 119*  --  119* 158*  BUN 14 13 17   --  21* 19  CREATININE 0.95 1.08* 1.24*  --  1.10* 1.17*  CALCIUM 8.9 8.8* 8.6*  --  8.9 9.2  MG 1.7  --   --  1.8  --  1.7  PHOS  --   --   --  3.7  --   --    GFR: Estimated Creatinine Clearance: 54.4 mL/min (A) (by C-G formula based on SCr of 1.17 mg/dL (H)). Liver Function Tests: No results for input(s): AST, ALT, ALKPHOS, BILITOT, PROT, ALBUMIN in the last 168 hours. No results for input(s): LIPASE, AMYLASE in the last 168 hours. No results for input(s): AMMONIA in the last 168 hours. Coagulation Profile: No results for input(s): INR, PROTIME in the last 168 hours. Cardiac Enzymes: Recent Labs  Lab 10/25/17 2209  TROPONINI <0.03   BNP (last 3 results) No results for input(s): PROBNP in the last 8760 hours. HbA1C: No results for input(s): HGBA1C in the last 72 hours. CBG: Recent Labs  Lab 10/26/17 1127 10/26/17 1554  10/26/17 2122 10/27/17 0634 10/27/17 1135  GLUCAP 124* 201* 125* 171* 117*   Lipid Profile: No results for input(s): CHOL, HDL, LDLCALC, TRIG, CHOLHDL, LDLDIRECT in the last 72 hours. Thyroid Function Tests: No results for input(s): TSH, T4TOTAL, FREET4, T3FREE, THYROIDAB in the last 72 hours. Anemia Panel: No results for input(s): VITAMINB12, FOLATE, FERRITIN, TIBC, IRON, RETICCTPCT in the last 72 hours. Urine analysis:    Component Value Date/Time   COLORURINE YELLOW 06/23/2017 2047   APPEARANCEUR CLEAR 06/23/2017 2047   LABSPEC 1.018 06/23/2017 2047   PHURINE 6.0 06/23/2017 2047   GLUCOSEU >=500 (A) 06/23/2017 2047   GLUCOSEU NEGATIVE 04/17/2016 [place] 06/23/2017 2047   BILIRUBINUR NEGATIVE 06/23/2017 2047   [place] 06/23/2017 2047   PROTEINUR NEGATIVE 06/23/2017 2047   UROBILINOGEN 0.2 04/17/2016 0955   NITRITE  NEGATIVE 06/23/2017 2047   LEUKOCYTESUR NEGATIVE 06/23/2017 2047   Sepsis Labs: @LABRCNTIP (procalcitonin:4,lacticidven:4)  )No results found for this or any previous visit (from the past 240 hour(s)).    Studies: No results found.  Scheduled Meds: . atorvastatin  10 mg Oral Daily  . carbidopa-levodopa  1 tablet Oral TID  . carvedilol  12.5 mg Oral BID WC  . cholecalciferol  1,000 Units Oral Daily  . colchicine  0.6 mg Oral Daily  . diclofenac sodium  2 g Topical QID  . famotidine  20 mg Oral BID  . furosemide  80 mg Oral Once per day on Mon Wed Fri   And  . furosemide  40 mg Oral Once per day on Sun Tue Thu Sat  . guaiFENesin  600 mg Oral BID  . heparin  5,000 Units Subcutaneous Q8H  . insulin aspart  0-9 Units Subcutaneous TID WC  . insulin glargine  30 Units Subcutaneous QHS  . isosorbide mononitrate  60 mg Oral Daily  . lamoTRIgine  100 mg Oral BID  . lipase/protease/amylase  48,000 Units Oral BID WC   And  . lipase/protease/amylase  24,000 Units Oral Q lunch  . metolazone  2.5 mg Oral Once per day on Sun Tue Thu Sat   . pregabalin  100 mg Oral BID  . sertraline  50 mg Oral QPM    Continuous Infusions:   LOS: 6 days     Desiree Hane, MD [facility] Pager [phone]  If 7PM-7AM, please contact night-coverage [email or web address] Password [email or web address] 10/27/2017, 2:40 PM

## 2017-10-27 NOTE — Plan of Care (Signed)
Care plans reviewed and patient is progressing.  

## 2017-10-28 DIAGNOSIS — I4510 Unspecified right bundle-branch block: Secondary | ICD-10-CM | POA: Diagnosis not present

## 2017-10-28 DIAGNOSIS — G4733 Obstructive sleep apnea (adult) (pediatric): Secondary | ICD-10-CM

## 2017-10-28 DIAGNOSIS — I110 Hypertensive heart disease with heart failure: Principal | ICD-10-CM

## 2017-10-28 DIAGNOSIS — R488 Other symbolic dysfunctions: Secondary | ICD-10-CM | POA: Diagnosis not present

## 2017-10-28 DIAGNOSIS — D62 Acute posthemorrhagic anemia: Secondary | ICD-10-CM | POA: Diagnosis not present

## 2017-10-28 DIAGNOSIS — Z6841 Body Mass Index (BMI) 40.0 and over, adult: Secondary | ICD-10-CM

## 2017-10-28 DIAGNOSIS — R0602 Shortness of breath: Secondary | ICD-10-CM | POA: Diagnosis not present

## 2017-10-28 DIAGNOSIS — M6281 Muscle weakness (generalized): Secondary | ICD-10-CM | POA: Diagnosis not present

## 2017-10-28 DIAGNOSIS — G20 Parkinson's disease: Secondary | ICD-10-CM | POA: Diagnosis not present

## 2017-10-28 DIAGNOSIS — F329 Major depressive disorder, single episode, unspecified: Secondary | ICD-10-CM | POA: Diagnosis not present

## 2017-10-28 DIAGNOSIS — I5030 Unspecified diastolic (congestive) heart failure: Secondary | ICD-10-CM | POA: Diagnosis not present

## 2017-10-28 DIAGNOSIS — I5032 Chronic diastolic (congestive) heart failure: Secondary | ICD-10-CM | POA: Diagnosis not present

## 2017-10-28 DIAGNOSIS — G4089 Other seizures: Secondary | ICD-10-CM | POA: Diagnosis not present

## 2017-10-28 DIAGNOSIS — K861 Other chronic pancreatitis: Secondary | ICD-10-CM | POA: Diagnosis not present

## 2017-10-28 DIAGNOSIS — E662 Morbid (severe) obesity with alveolar hypoventilation: Secondary | ICD-10-CM | POA: Diagnosis not present

## 2017-10-28 DIAGNOSIS — J9611 Chronic respiratory failure with hypoxia: Secondary | ICD-10-CM | POA: Diagnosis not present

## 2017-10-28 DIAGNOSIS — I2510 Atherosclerotic heart disease of native coronary artery without angina pectoris: Secondary | ICD-10-CM | POA: Diagnosis not present

## 2017-10-28 DIAGNOSIS — I5033 Acute on chronic diastolic (congestive) heart failure: Secondary | ICD-10-CM | POA: Diagnosis not present

## 2017-10-28 DIAGNOSIS — J9621 Acute and chronic respiratory failure with hypoxia: Secondary | ICD-10-CM | POA: Diagnosis not present

## 2017-10-28 DIAGNOSIS — G40909 Epilepsy, unspecified, not intractable, without status epilepticus: Secondary | ICD-10-CM

## 2017-10-28 DIAGNOSIS — E1165 Type 2 diabetes mellitus with hyperglycemia: Secondary | ICD-10-CM | POA: Diagnosis not present

## 2017-10-28 DIAGNOSIS — J441 Chronic obstructive pulmonary disease with (acute) exacerbation: Secondary | ICD-10-CM | POA: Diagnosis not present

## 2017-10-28 DIAGNOSIS — F339 Major depressive disorder, recurrent, unspecified: Secondary | ICD-10-CM | POA: Diagnosis not present

## 2017-10-28 DIAGNOSIS — I10 Essential (primary) hypertension: Secondary | ICD-10-CM | POA: Diagnosis not present

## 2017-10-28 DIAGNOSIS — M069 Rheumatoid arthritis, unspecified: Secondary | ICD-10-CM | POA: Diagnosis not present

## 2017-10-28 DIAGNOSIS — M48061 Spinal stenosis, lumbar region without neurogenic claudication: Secondary | ICD-10-CM | POA: Diagnosis not present

## 2017-10-28 DIAGNOSIS — E785 Hyperlipidemia, unspecified: Secondary | ICD-10-CM | POA: Diagnosis not present

## 2017-10-28 DIAGNOSIS — R262 Difficulty in walking, not elsewhere classified: Secondary | ICD-10-CM | POA: Diagnosis not present

## 2017-10-28 DIAGNOSIS — I509 Heart failure, unspecified: Secondary | ICD-10-CM | POA: Diagnosis not present

## 2017-10-28 LAB — GLUCOSE, CAPILLARY
Glucose-Capillary: 176 mg/dL — ABNORMAL HIGH (ref 65–99)
Glucose-Capillary: 133 mg/dL — ABNORMAL HIGH (ref 65–99)

## 2017-10-28 NOTE — Care Management Important Message (Signed)
Important Message  Patient Details  Name: [name] MRN: [id number] Date of Birth: Feb 21, 1946   Medicare Important Message Given:  Yes    Hennessy Bartel P Zaylei Mullane 10/28/2017, 2:43 PM

## 2017-10-28 NOTE — Progress Notes (Addendum)
[facility] has received [facility] authorization for patient to admit to SNF today.  Patient will discharge to [facility] Anticipated discharge date: 10/28/17 Family notified: [name], sister  Transportation by: [facility]  Nurse to call report to [phone]. Patient will go to room 116 at the facility.   CSW signing off.  Estanislado Emms, [place]  Clinical Social Worker

## 2017-10-28 NOTE — Clinical Social Work Placement (Signed)
   CLINICAL SOCIAL WORK PLACEMENT  NOTE  Date:  10/28/2017  Patient Details  Name: [name] MRN: [id number] Date of Birth: 02/03/1946  Clinical Social Work is seeking post-discharge placement for this patient at the [place] level of care (*CSW will initial, date and re-position this form in  chart as items are completed):  Yes   Patient/family provided with [place] Work Department's list of facilities offering this level of care within the geographic area requested by the patient (or if unable, by the patient's family).  Yes   Patient/family informed of their freedom to choose among providers that offer the needed level of care, that participate in Medicare, Medicaid or managed care program needed by the patient, have an available bed and are willing to accept the patient.  Yes   Patient/family informed of [facility]'s ownership interest in [facility] and [facility], as well as of the fact that they are under no obligation to receive care at these facilities.  PASRR submitted to EDS on       PASRR number received on       Existing PASRR number confirmed on 10/23/17     FL2 transmitted to all facilities in geographic area requested by pt/family on 10/23/17     FL2 transmitted to all facilities within larger geographic area on       Patient informed that his/her managed care company has contracts with or will negotiate with certain facilities, including the following:  [facility]     Yes   Patient/family informed of bed offers received.  Patient chooses bed at [facility] recommends and patient chooses bed at      Patient to be transferred to [facility] on 10/28/17.  Patient to be transferred to facility by [facility]     Patient family notified on 10/28/17 of transfer.  Name of family member notified:        PHYSICIAN Please prepare priority discharge summary,  including medications, Please prepare prescriptions     Additional Comment:    _______________________________________________ Estanislado Emms, LCSW 10/28/2017, 10:08 AM

## 2017-10-28 NOTE — Care Management Note (Signed)
Case Management Note Marvetta Gibbons RN, BSN Unit 4E-Case Manager [phone]  Patient Details  Name: [name] MRN: [id number] Date of Birth: Jan 16, 1946  Subjective/Objective:   Pt admitted with acute on chronic HF                 Action/Plan: PTA pt lived at home, has home 02 at baseline and cpap, also has all other needed DME. hx-non-compliance, per PT /OT eval recommendations for SNF- CSW consulted for possible SNF placement. CM to follow for any further transition needs.   Expected Discharge Date:  10/28/17               Expected Discharge Plan:  [place]  In-House Referral:  Clinical Social Work  Discharge planning Services  CM Consult  Post Acute Care Choice:    Choice offered to:  NA  DME Arranged:    DME Agency:     HH Arranged:    [place] Agency:     Status of Service:  Completed, signed off  If discussed at [employer], dates discussed:    Discharge Disposition: skilled facility   Additional Comments:  10/29/27- 51- Usman Millett RN, CM- pt has insurance auth per CSW for d/c to SNF today- CSW following for placement needs.   Dawayne Patricia, RN 10/28/2017, 11:05 AM

## 2017-10-28 NOTE — Progress Notes (Signed)
Patient is ready for discharge. Called RN at [facility] and gave report on patient. Patient has all of her belongings. She is alert and oriented and Vital Signs are stable. Patient will leave unit via [facility] who will transport her to [place].

## 2017-10-28 NOTE — Discharge Summary (Signed)
Discharge Summary(updated)  [name] YQM:[id number] DOB: March 21, 1946  PCP: [name] Chard, MD  Admit date: 10/20/2017 Discharge date:    Time spent: < 25 minutes  Admitted From: Home Disposition: SNF  Recommendations for Outpatient Follow-up:  1. Transfer to rehab at [facility] 2. Outpatient follow-up with cardiology 3. PRN nebulizers  Home Health: No Equipment/Devices: None  Discharge Diagnoses:  Active Hospital Problems   Diagnosis Date Noted  . Acute on chronic respiratory failure with hypoxia ([place])   . CHF exacerbation ([place]) 10/20/2017  . OSA (obstructive sleep apnea) - not on CPAP 10/07/2017  . Seizure disorder ([place]) 10/31/2012  . Type II diabetes mellitus, uncontrolled ([place]) 10/31/2012  . Hypertensive heart disease 10/28/2011  . Dyslipidemia, goal LDL below 100 10/28/2011  . Morbid obesity with BMI of 50.0-59.9, adult ([place]) 10/28/2011  . Chronic pancreatitis ([place]) 10/28/2011  . Peripheral edema 04/14/2011  . Obesity hypoventilation syndrome ([place]) 10/26/2010  . Coronary artery disease, non-occlusive 08/28/2010    Resolved Hospital Problems  No resolved problems to display.    Discharge Condition: Stable CODE STATUS: Full Diet recommendation: Carb modified  Vitals:   10/28/17 0616 10/28/17 0800  BP:  (!) 113/53  Pulse:  63  Resp: 11   Temp:  97.9 F (36.6 C)  SpO2:  93%    History of present illness:  [name] is a 72 y.o. year old female with medical history significant for diastolic CHF, COPD, obesity hypoventilation syndrome and OSA not on CPAP, type 2 diabetes, hypertension who presented on 10/20/2017 with with cough and dyspnea, lower leg swelling and was found to have acute hypoxic respiratory failure secondary to combined CHF/COPD exacerbation . Remaining hospital course addressed in problem based format below:   Hospital Course:  Principal Problem:   Acute on chronic respiratory failure with hypoxia (HCC) Active Problems:  Obesity hypoventilation syndrome (HCC)   Peripheral edema   Chronic pancreatitis (HCC)   Coronary artery disease, non-occlusive   Hypertensive heart disease   Dyslipidemia, goal LDL below 100   Morbid obesity with BMI of 50.0-59.9, adult (HCC)   Type II diabetes mellitus, uncontrolled (HCC)   Seizure disorder (HCC)   OSA (obstructive sleep apnea) - not on CPAP   CHF exacerbation ([place])   1. Acute hypoxic respiratory failure, resolved.  Multifactorial etiology.  Due to CHF and COPD exacerbation in setting of obesity hypoventilation syndrome and patient with poor medication adherence.  Respiratory status improved with diuresis and scheduled bronchodilators prior to discharge patient no longer requiring oxygen able to maintain normal saturation on room air.   2. Acute on chronic CHFpEF exacerbation, resolved.  Presented with elevated BNP, chest x-ray showing cardiomegaly basilar atelectasis.  Repeat chest x-ray showed no volume overload.  Net -8 L during admission with IV Lasix followed by resuming home Lasix regimen of Lasix 80 mg MWF, 40 mg TTS, and metolazone 2.5 mg TTS.  Repeat TTE here showed normal diastolic function (previous TTE in November showed grade 2 diastolic dysfunction).  On discharge weight of 264 pounds (previous dry weight of 273)  3. COPD exacerbation, resolved.    Responded well to scheduled duo nebs during admission,continue PRN duo nebs. on discharge no longer requiring oxygen.  Still has transient cough, continue supportive care with incentive spirometry, flutter valve, cough syrup.   4. Obesity hypoventilation syndrome OSA, not on CPAP at home due to financial constraints.  5. Gout flare, right knee pain resolved.  X-ray of right knee showed severe 3 compartment OA with  no effusion.  Uric acid of 8 at 7.5, responded to colchicine therapy.  Only minimal right knee pain on discharge.  6. Chest pain, resolved.  Likely MSK  in nature given reproducibility on exam and worse  with cough.  Troponin negative, EKG showed no acute ischemic changes.  Able to reproduce on exam consistent with MSK etiology.  7. Type 2 diabetes with peripheral neuropathy. A1c 7.1, continue Lantus 30 units, Lyrica 100 g twice daily  8. Chronic anemia.  Iron panel consistent with iron deficiency.  Unable to tolerate oral iron due to constipation.  Status post one-time dose of IV iron during hospitalization. Hgb stable at baseline  9. Hypertension, stable.  Continue reduced home dose of Coreg 12.5 mg twice daily and Imdur  10. Hyperlipidemia, stable continue statin  11. History of seizures, stable.  Continue Lamictal  12. Chronic pancreatitis, stable.  Continue home Creon question  13. Depression/Parkinson's, stable continue Zoloft Sinemet  14. Obesity, BMI 51.6.    Antibiotics: None  Microbiology: None  Consultations:  None   Procedures/Studies: TTE, 10/25/2017: EF 57-84%, diastolic function parameters (TTE on 05/2017 showed grade 2 diastolic dysfunction) Dg Chest 2 View  Result Date: 10/20/2017 CLINICAL DATA:  Productive cough EXAM: CHEST - 2 VIEW COMPARISON:  06/23/2017 FINDINGS: The heart is moderately enlarged. Low lung volumes. Bibasilar atelectasis. No pneumothorax or pleural effusion. No interstitial edema. IMPRESSION: Cardiomegaly Bibasilar atelectasis. Electronically Signed   By: [name] M.D.   On: 10/20/2017 12:24   Dg Knee 1-2 Views Right  Result Date: 10/23/2017 CLINICAL DATA:  Sudden onset of pain and swelling today EXAM: RIGHT KNEE - 1-2 VIEW COMPARISON:  07/15/2012 FINDINGS: Osseous demineralization. Tricompartmental joint space narrowing and spur formation. No acute fracture, dislocation, or bone destruction. No knee joint effusion. IMPRESSION: Advanced tricompartmental osteoarthritic changes of the RIGHT knee. No acute abnormalities. Electronically Signed   By: [name] M.D.   On: 10/23/2017 13:18   Dg Chest Port 1 View  Result Date:  10/24/2017 CLINICAL DATA:  Pt had a new onset of productive cough today as well as increased SOB, pt states it has improved now - hx of CHF, htn, diabetes, seizures, PNA, COPD, dyspnea, Parkinson's , sleep apnea EXAM: PORTABLE CHEST 1 VIEW COMPARISON:  10/20/2017 FINDINGS: There is no focal parenchymal opacity. There is no pleural effusion or pneumothorax. There is stable cardiomegaly. The osseous structures are unremarkable. IMPRESSION: No active disease. Electronically Signed   By: Kathreen Devoid   On: 10/24/2017 13:48     Discharge Exam: BP (!) 113/53 (BP Location: Left Arm)   Pulse 63   Temp 97.9 F (36.6 C) (Oral)   Resp 11   Ht '5\' 1"'$  (1.549 m)   Wt 119.7 kg (264 lb)   SpO2 93%   BMI 49.88 kg/m   General: Lying in bed, no apparent distress Eyes: EOMI, anicteric ENT: Oral Mucosa clear and moist Cardiovascular: regular rate and rhythm, no murmurs, rubs or gallops, Peripheral Pulses Present, no edema,  Respiratory: Normal respiratory effort on room air, lungs clear to auscultation bilaterally Abdomen: soft, non-distended, non-tender, normal bowel sounds Skin: No Rash Musculoskeletal: Minimal tenderness with palpation of right knee, no surrounding erythema, great improvement in swelling, full range of motion.   Neurologic: Grossly no focal neuro deficit.Mental status AAOx3, speech normal, Psychiatric:Appropriate affect, and mood   Discharge Instructions You were cared for by a hospitalist during your hospital stay. If you have any questions about your discharge medications or the care you  received while you were in the hospital after you are discharged, you can call the unit and asked to speak with the hospitalist on call if the hospitalist that took care of you is not available. Once you are discharged, your primary care physician will handle any further medical issues. Please note that NO REFILLS for any discharge medications will be authorized once you are discharged, as it is  imperative that you return to your primary care physician (or establish a relationship with a primary care physician if you do not have one) for your aftercare needs so that they can reassess your need for medications and monitor your lab values.  Discharge Instructions    Diet - low sodium heart healthy   Complete by:  As directed    Diet - low sodium heart healthy   Complete by:  As directed    Increase activity slowly   Complete by:  As directed    Increase activity slowly   Complete by:  As directed      Allergies as of 10/28/2017      Reactions   Penicillins Hives   Has patient had a PCN reaction causing immediate rash, facial/tongue/throat swelling, SOB or lightheadedness with hypotension: Yes Has patient had a PCN reaction causing severe rash involving mucus membranes or skin necrosis: No Has patient had a PCN reaction that required hospitalization: No Has patient had a PCN reaction occurring within the last 10 years: No If all of the above answers are "NO", then may proceed with Cephalosporin use.   Sulfa Antibiotics Hives   Aspirin Other (See Comments)    On aspirin 81 mg - Rectal bleeding in dec 2018   Codeine    Headache and makes the patient feel "off"   Other Rash   Bleach      Medication List    STOP taking these medications   predniSONE 20 MG tablet Commonly known as:  DELTASONE     TAKE these medications   ACCU-CHEK NANO SMARTVIEW w/Device Kit Use to check blood sugar 3 times per day dx code E11.65   acetaminophen 500 MG tablet Commonly known as:  TYLENOL Take 500-1,000 mg by mouth every 4 (four) hours as needed (for pain).   atorvastatin 10 MG tablet Commonly known as:  LIPITOR Take 10 mg by mouth daily.   B-complex with vitamin C tablet Take 1 tablet by mouth daily.   carbidopa-levodopa 25-100 MG tablet Commonly known as:  SINEMET IR Take 1 tablet by mouth 3 (three) times daily.   carvedilol 12.5 MG tablet Commonly known as:  COREG Take 1  tablet (12.5 mg total) by mouth 2 (two) times daily with a meal. What changed:    medication strength  how much to take   chlorpheniramine-HYDROcodone 10-8 MG/5ML Suer Commonly known as:  TUSSIONEX Take 5 mLs by mouth every 12 (twelve) hours as needed for cough.   cholestyramine 4 g packet Commonly known as:  QUESTRAN Take 4 g by mouth 2 (two) times daily as needed (for diarrhea).   diclofenac sodium 1 % Gel Commonly known as:  VOLTAREN Apply 2 g topically 4 (four) times daily.   furosemide 40 MG tablet Commonly known as:  LASIX Take 40 mg ( 1 tablet) by mouth on SUN,TUES,WED., THUR.,SAT. And take 80 mg th other days What changed:    how much to take  how to take this  when to take this  additional instructions   Insulin Glargine 300 UNIT/ML Sopn Commonly  known as:  TOUJEO SOLOSTAR Inject 40 Units into the skin daily. What changed:    how much to take  when to take this   Insulin Pen Needle 31G X 8 MM Misc Use to inject medication 4 times daily   ipratropium-albuterol 0.5-2.5 (3) MG/3ML Soln Commonly known as:  DUONEB Take 3 mLs by nebulization every 4 (four) hours as needed.   isosorbide mononitrate 60 MG 24 hr tablet Commonly known as:  IMDUR Take 1 tablet (60 mg total) by mouth daily.   lamoTRIgine 100 MG tablet Commonly known as:  LAMICTAL Take 1 tablet (100 mg total) by mouth 2 (two) times daily.   Menthol (Topical Analgesic) 3.5 % Gel Apply 1 application topically as needed (for neuropathy in feet).   metolazone 2.5 MG tablet Commonly known as:  ZAROXOLYN Take 1 tablet (2.5 mg total) by mouth as directed. What changed:    when to take this  reasons to take this   multivitamin with minerals tablet Take 1 tablet by mouth daily.   OZEMPIC 0.25 or 0.5 MG/DOSE Sopn Generic drug:  Semaglutide Inject 0.5 mg as directed once a week. Marland Kitchen025 for 4 weeks and then do .05 for continuous   Pancrelipase (Lip-Prot-Amyl) 25000 units Cpep Take  25,000-50,000 Units by mouth See admin instructions. 50,000 units with breakfast then 25,000 units with lunch then 50,000 units with dinner (evening meal)   potassium chloride SA 20 MEQ tablet Commonly known as:  K-DUR,KLOR-CON Take 2 tablets (40 mEq total) by mouth daily. On the day you take metolazone, take an extra 20 meq (60 meq total)   pregabalin 100 MG capsule Commonly known as:  LYRICA Take 100 mg by mouth 2 (two) times daily.   sertraline 50 MG tablet Commonly known as:  ZOLOFT Take 50 mg by mouth every evening.   Vitamin D-3 1000 units Caps Take 1,000 Units by mouth daily.      Allergies  Allergen Reactions  . Penicillins Hives    Has patient had a PCN reaction causing immediate rash, facial/tongue/throat swelling, SOB or lightheadedness with hypotension: Yes Has patient had a PCN reaction causing severe rash involving mucus membranes or skin necrosis: No Has patient had a PCN reaction that required hospitalization: No Has patient had a PCN reaction occurring within the last 10 years: No If all of the above answers are "NO", then may proceed with Cephalosporin use.   . Sulfa Antibiotics Hives  . Aspirin Other (See Comments)     On aspirin 81 mg - Rectal bleeding in dec 2018  . Codeine     Headache and makes the patient feel "off"  . Other Rash    Bleach   Contact information for after-discharge care    Destination    [facility] .   Service:  Skilled Nursing Contact information: [address] [place] [phone]               The results of significant diagnostics from this hospitalization (including imaging, microbiology, ancillary and laboratory) are listed below for reference.    Significant Diagnostic Studies: Dg Chest 2 View  Result Date: 10/20/2017 CLINICAL DATA:  Productive cough EXAM: CHEST - 2 VIEW COMPARISON:  06/23/2017 FINDINGS: The heart is moderately enlarged. Low lung volumes.  Bibasilar atelectasis. No pneumothorax or pleural effusion. No interstitial edema. IMPRESSION: Cardiomegaly Bibasilar atelectasis. Electronically Signed   By: [name] M.D.   On: 10/20/2017 12:24   Dg Knee 1-2  Views Right  Result Date: 10/23/2017 CLINICAL DATA:  Sudden onset of pain and swelling today EXAM: RIGHT KNEE - 1-2 VIEW COMPARISON:  07/15/2012 FINDINGS: Osseous demineralization. Tricompartmental joint space narrowing and spur formation. No acute fracture, dislocation, or bone destruction. No knee joint effusion. IMPRESSION: Advanced tricompartmental osteoarthritic changes of the RIGHT knee. No acute abnormalities. Electronically Signed   By: [name] M.D.   On: 10/23/2017 13:18   Dg Chest Port 1 View  Result Date: 10/24/2017 CLINICAL DATA:  Pt had a new onset of productive cough today as well as increased SOB, pt states it has improved now - hx of CHF, htn, diabetes, seizures, PNA, COPD, dyspnea, Parkinson's , sleep apnea EXAM: PORTABLE CHEST 1 VIEW COMPARISON:  10/20/2017 FINDINGS: There is no focal parenchymal opacity. There is no pleural effusion or pneumothorax. There is stable cardiomegaly. The osseous structures are unremarkable. IMPRESSION: No active disease. Electronically Signed   By: Kathreen Devoid   On: 10/24/2017 13:48    Microbiology: No results found for this or any previous visit (from the past 240 hour(s)).   Labs: Basic Metabolic Panel: Recent Labs  Lab 10/22/17 0348 10/23/17 0257 10/24/17 0309 10/24/17 1849 10/25/17 0434 10/25/17 2209  NA 139 136 132*  --  133* 133*  K 3.3* 3.7 3.7  --  3.3* 3.5  CL 95* 95* 93*  --  91* 87*  CO2 34* 33* 28  --  34* 36*  GLUCOSE 113* 155* 119*  --  119* 158*  BUN 14 13 17   --  21* 19  CREATININE 0.95 1.08* 1.24*  --  1.10* 1.17*  CALCIUM 8.9 8.8* 8.6*  --  8.9 9.2  MG 1.7  --   --  1.8  --  1.7  PHOS  --   --   --  3.7  --   --    Liver Function Tests: No results for input(s): AST, ALT, ALKPHOS, BILITOT, PROT, ALBUMIN  in the last 168 hours. No results for input(s): LIPASE, AMYLASE in the last 168 hours. No results for input(s): AMMONIA in the last 168 hours. CBC: Recent Labs  Lab 10/22/17 0348 10/23/17 0257 10/24/17 0309  WBC 5.7 8.3 5.9  HGB 11.7* 11.5* 10.8*  HCT 37.7 36.7 34.7*  MCV 74.7* 74.3* 74.1*  PLT 191 187 200   Cardiac Enzymes: Recent Labs  Lab 10/25/17 2209  TROPONINI <0.03   BNP: BNP (last 3 results) Recent Labs    06/18/17 0558 06/23/17 2050 10/20/17 1244  BNP 175.4* 69.9 274.3*    ProBNP (last 3 results) No results for input(s): PROBNP in the last 8760 hours.  CBG: Recent Labs  Lab 10/27/17 0634 10/27/17 1135 10/27/17 1605 10/27/17 2113 10/28/17 0625  GLUCAP 171* 117* 225* 216* 176*       Signed:  Desiree Hane, MD [facility] 10/28/2017, 10:19 AM

## 2017-10-29 ENCOUNTER — Non-Acute Institutional Stay (SKILLED_NURSING_FACILITY): Payer: Medicare HMO | Admitting: Internal Medicine

## 2017-10-29 ENCOUNTER — Encounter: Payer: Self-pay | Admitting: Internal Medicine

## 2017-10-29 DIAGNOSIS — D62 Acute posthemorrhagic anemia: Secondary | ICD-10-CM | POA: Diagnosis not present

## 2017-10-29 DIAGNOSIS — G4733 Obstructive sleep apnea (adult) (pediatric): Secondary | ICD-10-CM | POA: Diagnosis not present

## 2017-10-29 DIAGNOSIS — J9621 Acute and chronic respiratory failure with hypoxia: Secondary | ICD-10-CM

## 2017-10-29 DIAGNOSIS — K861 Other chronic pancreatitis: Secondary | ICD-10-CM | POA: Diagnosis not present

## 2017-10-29 DIAGNOSIS — E1165 Type 2 diabetes mellitus with hyperglycemia: Secondary | ICD-10-CM | POA: Diagnosis not present

## 2017-10-29 NOTE — Progress Notes (Signed)
NURSING HOME LOCATION:  [facility] ROOM NUMBER:  [id number]  CODE STATUS:  Full Code  PCP:  [name], MD  [address] [place] [address]   This is a comprehensive admission note to [facility] performed on this date less than 30 days from date of admission. Included are preadmission medical/surgical history;reconciled medication list; family history; social history and comprehensive review of systems.  Corrections and additions to the records were documented. Comprehensive physical exam was also performed. Additionally a clinical summary was entered for each active diagnosis pertinent to this admission in the Problem List to enhance continuity of care.  HPI: Patient was hospitalized 4/28-10/28/17 with acute on chronic respiratory failure with hypoxia. Presentation was cough and dyspnea with lower extremity edema. She was felt to have acute hypoxic respiratory failure in the context of combined congestive heart failure/COPD exacerbation. Additionally a factor was felt to be obesity hypoventilation syndrome w/o CPAP treatment and poor medication adherence.Chest x-ray revealed cardiomegaly w/o acute process. BNP was elevated.  With diuresis she lost 8 L of fluid weight; discharge weight was 264 pounds. She was transitioned from IV Lasix to high-dose oral Lasix and metolazone Tues, Thursday, Saturday. Her status improved with the aggressive diuresis and scheduled bronchodilator therapy. At discharge the patient was no longer oxygen dependent. Prior TEE had revealed grade 2 diastolic dysfunction, this was not present on TEE performed this admission. The patient did complain of chest pain which was attributed to musculoskeletal etiology in the context of COPD related cough. Troponins were negative. EKG revealed no acute ischemic changes Although the patient has obstructive sleep apnea she is not on CPAP at home because of financial constraints. Course was complicated by  a gout flare of the right knee. Imaging revealed severe 3 compartment osteoarthritis. Uric acid level was 8, symptoms responded to colchicine. She has insulin-dependent diabetes with peripheral neuropathy. A1c was 7.1%. She was to continue continue basal  30 units daily as well as Lyrica 100 mg twice daily for the neuropathy. At discharge she was on Omzempic, this was held as Up to Date states this can be associated with pancreatitis; typically lipase elevation may occur 22% of the time and amylase 13% of the time.  She has chronic anemia, multifactorial. Iron panel was consistent with iron deficiency. She was intolerant of oral iron due to constipation. Received 1 dose of IV iron during the hospitalization. Hypertension was stable and Coreg was decreased. Here at the SNF there is a discrepancy in recorded medications and the regimen described by the patient. She was discharged on 80 mg of Lasix on Monday and Friday but she states she takes 80 mg on Monday, Wednesday, and Friday. Discharge summary states 40 mg of Lasix on Tuesday, Thursday, Wednesday, Saturday, and Sunday. She was on 40 mg Tuesday, Thursday, Saturday, and Sunday.Toujeo dose at discharge was 40 units, she states she takes 30 units. As noted A1c indicates adequate control with a value of 7.1%. Last labs were 10/25/17;sodium was 133, glucose 158, creatinine 1.17, and GFR 54. On 5/2 hemoglobin 10.8 and hematocrit 34.7 with microcytic, hypochromic indices. Only Glucose on record @ SNF was 177 last night at 11:15 PM  Past medical and surgical history:Includes dyslipidemia for which she is on a statin, seizure disorder, chronic pancreatitis, depression/Parkinson's, and morbid obesity. Pancreatitis was apparently related to gallstones.  Colon polyps removed in 2018.   Social history: Nondrinker, former smoker  Family history:reviewed   Review of systems: She's had no  acute  cardiopulmonary symptoms here at the SNF, rarely she's had paroxysmal  nocturnal dyspnea in the recent past.. She does describe a cough productive of clear phlegm. She denies any active bleeding dyscrasias. She does have occasional pressure across abdomen but no other GI or GU symptoms except frequency with the diuretics.She had no recurrent pancreatitis symptoms on Ozempic for the last 5 weeks. At home fasting blood sugars were 86-114. She's not had hypoglycemia recently. She does describe frequency related to the diuretics.  She has been rescheduled for sleep study but this is not been completed. She has been told she snores.  She has history of depression but this is not active.   Physical exam:  Pertinent or positive findings:She is morbidly obese. Heart sounds are distant. Rhythm is slow and slightly irregular. Breath sounds are decreased. Abdomen is protuberant. She wears braces over the ankles. Pedal pulses are decreased. The left upper extremity is weaker than the right. Both lower extremities exhibit symmetrical weakness.   General appearance: Adequately nourished; no acute distress, increased work of breathing is present.   Lymphatic: No lymphadenopathy about the head, neck, axilla. Eyes: No conjunctival inflammation or lid edema is present. There is no scleral icterus. Ears:  External ear exam shows no significant lesions or deformities.   Nose:  External nasal examination shows no deformity or inflammation. Nasal mucosa are pink and moist without lesions, exudates Oral exam: Lips and gums are healthy appearing.There is no oropharyngeal erythema or exudate. Neck:  No thyromegaly, masses, tenderness noted.    Heart:  No gallop, murmur, click, rub .  Lungs: without wheezes, rhonchi, rales, rubs. Abdomen: Bowel sounds are normal.  Abdomen is soft and nontender with no organomegaly, hernias, masses. GU: Deferred  Extremities:  No cyanosis, clubbing  Neurologic exam:   Balance, Rhomberg, finger to nose testing could not be completed due to clinical  state Skin: Warm & dry w/o tenting. No significant lesions or rash.  See clinical summary under each active problem in the Problem List with associated updated therapeutic plan

## 2017-10-29 NOTE — Assessment & Plan Note (Addendum)
Monitor for any bleeding dyscrasias

## 2017-10-29 NOTE — Assessment & Plan Note (Addendum)
Review risk with pharmacy and prescribing physician prior to reinitiating Ozempic

## 2017-10-29 NOTE — Assessment & Plan Note (Addendum)
Pathophysiology of obstructive sleep apnea discussed with patient and her sister, healthcare power of attorney. The potential for right sided congestive heart failure and severe dysrrhythmias was discussed Repeat sleep study recommended with CPAP implementation if positive as per her PCP, Dr Baird Cancer

## 2017-10-29 NOTE — Assessment & Plan Note (Addendum)
Resume home regimen with Lasix 80 mg Monday, Wednesday, and Friday with 40 mg other days. Metolazone 2.5 mg will be administered Monday and Friday if there is a 5 pound weight gain. Weight at discharge of 264 pounds will be used as metric for Metolazone prn administration.

## 2017-10-29 NOTE — Patient Instructions (Signed)
See assessment and plan under each diagnosis in the problem list and acutely for this visit 

## 2017-10-29 NOTE — Assessment & Plan Note (Addendum)
A1c 7.1% which is adequate control in the context of her multiple comorbidities Ozempic held until risk of pancreatitis exacerbation can be determined by Pharmacy review & discussion with Rxing provider Read all labels ; consume LESS than 40 grams of sugar / day from foods & drinks with  High Fructose Corn Syrup as #1, 2 , 3 or # 4 on label. (Note : dividing the "grams of sugar" on label by 4 gives "teaspoons of sugar" content of food or drink. For example a 22 oz Coke has 64 grams of sugar or 16 tsp of sugar).

## 2017-11-01 ENCOUNTER — Other Ambulatory Visit: Payer: Medicare HMO | Admitting: Licensed Clinical Social Worker

## 2017-11-01 NOTE — Patient Outreach (Addendum)
[facility]) Care Management  11/01/2017  [name] 1945/08/28 [id number]  [facility] CSW received new referral from [facility] to follow patient on 10/31/17 and was notified that patient is currently at [facility]. [facility] CSW arrived at [facility] on 11/01/17 and was able to meet with patient successfully. HIPPA verifications provided successfully as well. [facility] CSW introduced self, reason for call and of [facility] social work services. Patient agreeable to services and agreeable to be followed by [facility] CSW while she is at [place]. Patient was hospitalized 4/28-10/28/17 with acute on chronic respiratory failure with hypoxia. Presentation was cough and dyspnea with lower extremity edema. She was felt to have acute hypoxic respiratory failure in the context of combined congestive heart failure/COPD exacerbation. Another factor could be obesity hypoventilation syndrome w/o CPAP treatment and poor medication adherence. Patient still complains of an ongoing cough which may be contributed to COPD. Patient is a former smoker. Patient reports no issues with her appetite but does report to experience some pain in her lower extremities. Patient has multiple comorbidities and would benefit from disease education and medication adherence post SNF discharge. Patient denies any social work needs. Patient shares that she lives with her sister and brother in law and that her family is very supportive of her. Patient's PT came in room briefly and stated that patient is doing excellent in therapy and has already made some progress. Patient reports that she has stable transportation to her medical appointments through her sister. Patient reports having the following medical equipment: walker, cane and shower chair with a back. Patient shares that she is ready to return back home because she feels she can get the same care and treatment there than she can here. [facility] CSW provided reflective listening and implement motivation  interviewing interventions to help support patient's ultimate goal which is stay at home and be indenpent for as long as possible. SNF social worker Daneen Schick is currently out of the office but [facility] CSW will follow up with SNF within two weeks to gain discharge updates and dispositions.   [name], BSW, MSW, [email or web address] Phone: [phone] Fax: [phone]

## 2017-11-04 ENCOUNTER — Other Ambulatory Visit: Payer: Self-pay | Admitting: Licensed Clinical Social Worker

## 2017-11-04 NOTE — Patient Outreach (Signed)
[facility]) Care Management  11/04/2017  [name] 1945-07-24 [id number]  Assessment- [facility] CSW received incoming call from patient's HCPOA/sister [name] on 11/04/17. Sister successfully provided HIPPA verifications and stated that patient wanted for her to contact [facility] CSW. Sister shares that the patient as well as her family wish for patient to return home. Family not satisfied with the care patient is receiving at SNF. However, family does not wish for patient to return home until her [facility] services, medical equipment and oxygen are ordered. Patient has not used oxygen before this most recent hospitalization and will need to continue use once she discharges back home and will need assistance with this follow up. Family reports that they have not informed SNF that patient wishes to return home but plan to notify them today in order to start planning a safe discharge plan back home. [facility] CSW provided SNF contact numbers to sister and agreed to update SNF social worker [name] of patient's plans to return home soon and need for discharge planning at this time. Sister appreciative of education and assistance offered during phone call today.   [facility] CSW will continue to follow case and provide social work assistance.   [name], BSW, MSW, [name] Phone: [phone] Fax: [phone]

## 2017-11-05 ENCOUNTER — Other Ambulatory Visit: Payer: Self-pay | Admitting: Licensed Clinical Social Worker

## 2017-11-05 NOTE — Patient Outreach (Signed)
[facility]) Care Management  11/05/2017  SHERRE WOOTON 01/30/1946 [id number]  [facility] CSW arrived at [facility] to complete [facility] Consult now that discharge plan has been changed. [facility] CSW was able to find patient in her room on the phone with her sister. Patient wished for [facility] CSW to talk to her sister about her discharge plans. [facility] CSW spoke with sister and successfully gained HIPPA verifications. Daughter shares that she met with the SNF social worker yesterday and they are planning to discharge patient back home on 11/07/17. Daughter reports that the facility is going to order a bedside commode for patient. Family agreeable to [facility] CSW completing referral to [place] upon her discharge back home to provide care management services and diease management education. [facility] CSW will await for SNF discharge and complete referral at that time.  [name], BSW, MSW, [email or web address] Phone: [phone] Fax: [phone]

## 2017-11-06 ENCOUNTER — Encounter: Payer: Self-pay | Admitting: Adult Health

## 2017-11-06 ENCOUNTER — Non-Acute Institutional Stay: Payer: Medicare HMO | Admitting: Adult Health

## 2017-11-06 DIAGNOSIS — E1165 Type 2 diabetes mellitus with hyperglycemia: Secondary | ICD-10-CM | POA: Diagnosis not present

## 2017-11-06 DIAGNOSIS — F329 Major depressive disorder, single episode, unspecified: Secondary | ICD-10-CM

## 2017-11-06 DIAGNOSIS — J441 Chronic obstructive pulmonary disease with (acute) exacerbation: Secondary | ICD-10-CM

## 2017-11-06 DIAGNOSIS — M069 Rheumatoid arthritis, unspecified: Secondary | ICD-10-CM | POA: Diagnosis not present

## 2017-11-06 DIAGNOSIS — I5032 Chronic diastolic (congestive) heart failure: Secondary | ICD-10-CM

## 2017-11-06 DIAGNOSIS — K861 Other chronic pancreatitis: Secondary | ICD-10-CM | POA: Diagnosis not present

## 2017-11-06 DIAGNOSIS — G40909 Epilepsy, unspecified, not intractable, without status epilepticus: Secondary | ICD-10-CM

## 2017-11-06 DIAGNOSIS — J449 Chronic obstructive pulmonary disease, unspecified: Secondary | ICD-10-CM | POA: Insufficient documentation

## 2017-11-06 DIAGNOSIS — I2510 Atherosclerotic heart disease of native coronary artery without angina pectoris: Secondary | ICD-10-CM

## 2017-11-06 DIAGNOSIS — G20 Parkinson's disease: Secondary | ICD-10-CM

## 2017-11-06 DIAGNOSIS — F339 Major depressive disorder, recurrent, unspecified: Secondary | ICD-10-CM | POA: Diagnosis not present

## 2017-11-06 MED ORDER — IPRATROPIUM-ALBUTEROL 0.5-2.5 (3) MG/3ML IN SOLN: 3.0000 mL | RESPIRATORY_TRACT | 0 refills | Status: DC | PRN

## 2017-11-06 MED ORDER — METOLAZONE 2.5 MG PO TABS: 2.5000 mg | ORAL_TABLET | ORAL | 0 refills | Status: DC

## 2017-11-06 MED ORDER — FUROSEMIDE 80 MG PO TABS: 40.0000 mg | ORAL_TABLET | Freq: Every day | ORAL | 0 refills | Status: DC

## 2017-11-06 MED ORDER — CARVEDILOL 12.5 MG PO TABS: 12.5000 mg | ORAL_TABLET | Freq: Two times a day (BID) | ORAL | 0 refills | Status: DC

## 2017-11-06 MED ORDER — ISOSORBIDE MONONITRATE ER 60 MG PO TB24: 60.0000 mg | ORAL_TABLET | Freq: Every day | ORAL | 0 refills | Status: DC

## 2017-11-06 MED ORDER — SERTRALINE HCL 50 MG PO TABS: 50.0000 mg | ORAL_TABLET | Freq: Every evening | ORAL | 0 refills | Status: DC

## 2017-11-06 MED ORDER — PREGABALIN 100 MG PO CAPS: 100.0000 mg | ORAL_CAPSULE | Freq: Two times a day (BID) | ORAL | 0 refills | Status: DC

## 2017-11-06 MED ORDER — ATORVASTATIN CALCIUM 10 MG PO TABS: 10.0000 mg | ORAL_TABLET | Freq: Every day | ORAL | 0 refills | Status: DC

## 2017-11-06 MED ORDER — CARBIDOPA-LEVODOPA 25-100 MG PO TABS: 1.0000 | ORAL_TABLET | Freq: Three times a day (TID) | ORAL | 0 refills | Status: DC

## 2017-11-06 MED ORDER — LAMOTRIGINE 100 MG PO TABS: 100.0000 mg | ORAL_TABLET | Freq: Two times a day (BID) | ORAL | 0 refills | Status: DC

## 2017-11-06 MED ORDER — CHOLESTYRAMINE 4 G PO PACK: 4.0000 g | PACK | Freq: Two times a day (BID) | ORAL | 0 refills | Status: DC | PRN

## 2017-11-06 MED ORDER — INSULIN GLARGINE 300 UNIT/ML ~~LOC~~ SOPN: 30.0000 [IU] | PEN_INJECTOR | Freq: Every day | SUBCUTANEOUS | 0 refills | Status: DC

## 2017-11-06 MED ORDER — DICLOFENAC SODIUM 1 % TD GEL: 2.0000 g | Freq: Four times a day (QID) | TRANSDERMAL | 0 refills | Status: DC

## 2017-11-06 MED ORDER — MENTHOL (TOPICAL ANALGESIC) 3.5 % EX GEL: 1.0000 "application " | CUTANEOUS | 0 refills | Status: DC | PRN

## 2017-11-06 MED ORDER — POTASSIUM CHLORIDE CRYS ER 20 MEQ PO TBCR: 40.0000 meq | EXTENDED_RELEASE_TABLET | Freq: Every day | ORAL | 0 refills | Status: DC

## 2017-11-06 NOTE — Progress Notes (Signed)
Location:  [facility] Room Number: 116-A Place of Service:  SNF (31) Provider:  Medina-Vargas, Monina, NP  Patient Care Team: Sanders, Robyn, MD as PCP - General (Internal Medicine) Harding, David W, MD as PCP - Cardiology (Cardiology) Anthony, Arennette, NP as Referring Physician (Nurse Practitioner) Outlaw, William, MD as Attending Physician (Gastroenterology) Kumar, Ajay, MD as Attending Physician (Endocrinology) Boggs, Jane, MD as Referring Physician (Neurology) Petery, John, DPM as Consulting Physician (Podiatry) Joyce, Brooke L, LCSW as [facility] Care Management (Licensed Clinical Social Worker)  Extended Emergency Contact Information Primary Emergency Contact: Pressley,Sandra Address: [address]          [place], [place] [address] [place] Home Phone: [phone] Mobile Phone: [phone] Relation: Sister Secondary Emergency Contact: Young,Daniel  [place] Home Phone: [phone] Relation: Son  Code Status:  Full Code  Goals of care: Advanced Directive information Advanced Directives 10/21/2017  Does Patient Have a Medical Advance Directive? Yes  Type of Advance Directive Living will  Does patient want to make changes to medical advance directive? No - Patient declined  Copy of Healthcare Power of Attorney in Chart? -  Would patient like information on creating a medical advance directive? -  Pre-existing out of facility DNR order (yellow form or pink MOST form) -     Chief Complaint  Patient presents with  . Discharge Note    Patient is discharging home 5/16 with home health services    HPI:  Pt is a 72 y.o. female seen today for discharge.  She will discharge home on 11/07/17 with home health PT and OT.    She has been admitted to [facility] on 10/28/17 from a recent hospitalization for acute respiratory failure with hypoxia secondary to combined CHF and COPD  exacerbation. He was treated with IV Lasix then resumed home oral Lasix regimen. He had a net -8L. She responded well to scheduled nebulizations and currently needing O2 1l?min vi [place] at HS and exercise. She has a PMH of dyslipidemia, seizure disorder, chronic pancreatitis, depression, morbid obesity, and Parkinson's.     Patient was admitted to this facility for short-term rehabilitation after the patient's recent hospitalization.  Patient has completed SNF rehabilitation and therapy has cleared the patient for discharge.    Past Medical History:  Diagnosis Date  . Abnormal liver function     in the past.  . Anemia   . Arthritis    all over  . Bruises easily   . Cataract    right eye;immature  . CHF (congestive heart failure) (HCC)   . Chronic back pain    stenosis  . Chronic cough   . Chronic kidney disease   . COPD (chronic obstructive pulmonary disease) (HCC)   . Demand myocardial infarction (HCC) 2012   Demand Infarction in setting of Pancreatitis --> mild Troponin elevation, Non-obstructive CAD  . Depression    takes Abilify daily as well as Zoloft  . Diastolic heart failure 2010   Grade 1 diastolic Dysfunction by Echo   . Diverticulosis   . DM (diabetes mellitus) (HCC)    takes Victoza daily as well as Lantus and Humalog  . Dyspnea   . Empty sella (HCC)    on MRI in 2009.  . Glaucoma   . Headache(784.0)    last migraine-4-5yrs ago  . History of blood transfusion    no abnormal reaction noted  . History of colon polyps    benign  .   History of influenza    antibiotic given yesterday along with taking Delsym cough syrup  . HTN (hypertension)    takes [employer] daily  . Hyperlipidemia    takes Lipitor daily  . Joint swelling   . Nocturia   . Obstructive sleep apnea   . Pancreatitis    takes Pancrelipase daily  . Parkinson's disease ([place])    takes Sinemet daily  . Peripheral edema    takes Lasix daily  . Peripheral neuropathy   . Pneumonia  2012  . Seizures ([place])    takes Lamictal daily and Primidone nightly;last seizure 2wks ago  . Urinary frequency   . Urinary urgency   . Varicose veins of both lower extremities with pain    Past Surgical History:  Procedure Laterality Date  . ABDOMINAL HYSTERECTOMY    . APPENDECTOMY    . BACK SURGERY    . CARDIAC CATHETERIZATION  2009/March 2012   2009: (Dr. Alvie Heidelberg) Nonobstructive CAD; 2012: Minimal CAD --> false positive stress test  . Cardiac Event Monitor  September-October 2017   Sinus rhythm with occasional PACs and artifact. No arrhythmias besides one short run of tachycardia.  . CHOLECYSTECTOMY    . COLONOSCOPY N/A 08/15/2012   Procedure: COLONOSCOPY;  Surgeon: Arta Silence, MD;  Location: [facility];  Service: Endoscopy;  Laterality: N/A;  . COLONOSCOPY WITH PROPOFOL Left 06/17/2017   Procedure: COLONOSCOPY WITH PROPOFOL;  Surgeon: [name], MD;  Location: [place];  Service: Gastroenterology;  Laterality: Left;  . ESOPHAGOGASTRODUODENOSCOPY    . eye cysts Bilateral   . LASIK    . NM MYOVIEW LTD  March 2012; Aug 2017   Probably normal Lexiscan Myoview. Mild reversible Anterior Defect ~ Breast Attenuation. (CRO ischemia):  FALSE POSITIVE by Cath; b) 8/'17: LOW RISK, No ischemia or Infarction.  Breast Attenuation not noted.  Marland Kitchen NM MYOVIEW LTD  01/2016   LOW RISK. No ischemia or infarction.  Marland Kitchen RECTAL POLYPECTOMY    . TONSILLECTOMY    . TRANSTHORACIC ECHOCARDIOGRAM  01/2016; 05/2018   A) Normal LV chamber size with mod LVH pattern. EF 50-55%. Severe LA dilation. Mod RA dilation. PAP elevated at 38 mmHg;; B) Mild concentric LVH.  EF 55-60% & no RWMA.  Grade 2 DD.  Diastolic flattening of the ventricular septum == ? elevated PAP.  Mild aortic valve calcification/sclerosis.  Mod LA dilation.  Mild RV dilation & Mod RA dilation  . TUBAL LIGATION    . vaginal cyst removed     several times    Allergies  Allergen Reactions  . Penicillins Hives    Has patient had a PCN  reaction causing immediate rash, facial/tongue/throat swelling, SOB or lightheadedness with hypotension: Yes Has patient had a PCN reaction causing severe rash involving mucus membranes or skin necrosis: No Has patient had a PCN reaction that required hospitalization: No Has patient had a PCN reaction occurring within the last 10 years: No If all of the above answers are "NO", then may proceed with Cephalosporin use.   . Sulfa Antibiotics Hives  . Aspirin Other (See Comments)     On aspirin 81 mg - Rectal bleeding in dec 2018  . Codeine     Headache and makes the patient feel "off"  . Other Rash    Bleach    Outpatient Encounter Medications as of 11/06/2017  Medication Sig  . acetaminophen (TYLENOL) 500 MG tablet Take 500 mg by mouth every 4 (four) hours as needed (for pain).   Marland Kitchen  atorvastatin (LIPITOR) 10 MG tablet Take 10 mg by mouth daily.  . B Complex-C (B-COMPLEX WITH VITAMIN C) tablet Take 1 tablet by mouth daily.  . carbidopa-levodopa (SINEMET IR) 25-100 MG per tablet Take 1 tablet by mouth 3 (three) times daily.   . carvedilol (COREG) 12.5 MG tablet Take 1 tablet (12.5 mg total) by mouth 2 (two) times daily with a meal.  . chlorpheniramine-HYDROcodone (TUSSIONEX) 10-8 MG/5ML SUER Take 5 mLs by mouth every 12 (twelve) hours as needed for cough.  . Cholecalciferol (VITAMIN D-3) 1000 units CAPS Take 1,000 Units by mouth daily.  . cholestyramine (QUESTRAN) 4 G packet Take 4 g by mouth 2 (two) times daily as needed (for diarrhea).   . diclofenac sodium (VOLTAREN) 1 % GEL Apply 2 g topically 4 (four) times daily.  . furosemide (LASIX) 80 MG tablet Take 40-80 mg by mouth daily. Take 80 mg M-W-F, take 40 mg all other days  . Insulin Glargine (TOUJEO MAX SOLOSTAR) 300 UNIT/ML SOPN Inject 30 Units into the skin at bedtime.  . ipratropium-albuterol (DUONEB) 0.5-2.5 (3) MG/3ML SOLN Take 3 mLs by nebulization every 4 (four) hours as needed.  . isosorbide mononitrate (IMDUR) 60 MG 24 hr tablet  Take 1 tablet (60 mg total) by mouth daily.  . lamoTRIgine (LAMICTAL) 100 MG tablet Take 1 tablet (100 mg total) by mouth 2 (two) times daily.  . Menthol, Topical Analgesic, 3.5 % GEL Apply 1 application topically as needed (for neuropathy in feet).   . metolazone (ZAROXOLYN) 2.5 MG tablet Take 2.5 mg by mouth See admin instructions. Take 1 tablet Monday and Friday only if weight is greater or equal to 269  . Multiple Vitamins-Minerals (MULTIVITAMIN WITH MINERALS) tablet Take 1 tablet by mouth daily.  . Pancrelipase, Lip-Prot-Amyl, 25000 UNITS CPEP Take 25,000-50,000 Units by mouth See admin instructions. 50,000 units with breakfast then 25,000 units with lunch then 50,000 units with dinner (evening meal)  . potassium chloride SA (K-DUR,KLOR-CON) 20 MEQ tablet Take 2 tablets (40 mEq total) by mouth daily. On the day you take metolazone, take an extra 20 meq (60 meq total)  . pregabalin (LYRICA) 100 MG capsule Take 100 mg by mouth 2 (two) times daily.  . sertraline (ZOLOFT) 50 MG tablet Take 50 mg by mouth every evening.   . Blood Glucose Monitoring Suppl (ACCU-CHEK NANO SMARTVIEW) W/DEVICE KIT Use to check blood sugar 3 times per day dx code E11.65 (Patient not taking: Reported on 10/29/2017)  . Insulin Pen Needle 31G X 8 MM MISC Use to inject medication 4 times daily (Patient not taking: Reported on 10/29/2017)  . [DISCONTINUED] furosemide (LASIX) 40 MG tablet Take 40 mg ( 1 tablet) by mouth on SUN,TUES,WED., THUR.,SAT. And take 80 mg th other days  . [DISCONTINUED] Insulin Glargine (TOUJEO SOLOSTAR) 300 UNIT/ML SOPN Inject 40 Units into the skin daily.  . [DISCONTINUED] metolazone (ZAROXOLYN) 2.5 MG tablet Take 1 tablet (2.5 mg total) by mouth as directed.   No facility-administered encounter medications on file as of 11/06/2017.     Review of Systems  GENERAL: No change in appetite, no fatigue, no weight changes, no fever, chills or weakness MOUTH and THROAT: Denies oral discomfort, gingival pain  or bleeding, pain from teeth or hoarseness   RESPIRATORY: no cough, SOB, DOE, wheezing, hemoptysis CARDIAC: No chest pain, edema or palpitations GI: No abdominal pain, diarrhea, constipation, heart burn, nausea or vomiting PSYCHIATRIC: Denies feelings of depression or anxiety. No report of hallucinations, insomnia, paranoia, or agitation     Immunization History  Administered Date(s) Administered  . Influenza Split 04/11/2011, 06/30/2013  . Influenza, High Dose Seasonal PF 02/14/2017  . Influenza-Unspecified 04/07/2014  . Tdap 05/18/2012  . Zoster 04/14/2014   Pertinent  Health Maintenance Due  Topic Date Due  . OPHTHALMOLOGY EXAM  04/04/1958  . MAMMOGRAM  05/17/2011  . DEXA SCAN  04/04/2013  . PNA vac Low Risk Adult (1 of 2 - PCV13) 04/04/2013  . FOOT EXAM  11/06/2014  . URINE MICROALBUMIN  04/17/2017  . INFLUENZA VACCINE  01/23/2018  . HEMOGLOBIN A1C  04/22/2018  . COLONOSCOPY  06/18/2027   Fall Risk  07/30/2017 02/28/2017 02/15/2017 10/08/2016 09/05/2016  Falls in the past year? No Yes Yes Yes Yes  Number falls in past yr: - 2 or more 2 or more 2 or more 2 or more  Injury with Fall? - No No No No  Risk Factor Category  - High Fall Risk High Fall Risk High Fall Risk High Fall Risk  Risk for fall due to : - History of fall(s);Impaired balance/gait;Impaired mobility History of fall(s);Impaired balance/gait;Impaired mobility History of fall(s);Impaired balance/gait;Impaired mobility;Impaired vision History of fall(s);Impaired balance/gait;Impaired mobility;Impaired vision  Follow up - Education provided;Falls prevention discussed Falls evaluation completed;Education provided;Falls prevention discussed Education provided;Falls prevention discussed;Falls evaluation completed Falls evaluation completed;Education provided;Falls prevention discussed     Vitals:   11/06/17 0852 11/06/17 0853  BP: (!) 142/77 (!) 143/76  Pulse: 81   Resp: 20   Temp: 98.8 F (37.1 C)   TempSrc: Oral   SpO2:  95%   Weight: 267 lb 8 oz (121.3 kg)   Height: 5' 1" (1.549 m)    Body mass index is 50.54 kg/m.  Physical Exam  GENERAL APPEARANCE: Well nourished. In no acute distress. Morbidly obese SKIN:  Skin is warm and dry.  MOUTH and THROAT: Lips are without lesions. Oral mucosa is moist and without lesions. Tongue is normal in shape, size, and color and without lesions RESPIRATORY: Breathing is even & unlabored, BS CTAB CARDIAC: RRR, no murmur,no extra heart sounds, no edema GI: Abdomen soft, normal BS, no masses, no tenderness EXTREMITIES:  Able to move X 4 extremities, bilateral braces on her feet PSYCHIATRIC: Alert to self and time, disoriented to place. Affect and behavior are appropriate   Labs reviewed: Recent Labs    10/22/17 0348  10/24/17 0309 10/24/17 1849 10/25/17 0434 10/25/17 2209  NA 139   < > 132*  --  133* 133*  K 3.3*   < > 3.7  --  3.3* 3.5  CL 95*   < > 93*  --  91* 87*  CO2 34*   < > 28  --  34* 36*  GLUCOSE 113*   < > 119*  --  119* 158*  BUN 14   < > 17  --  21* 19  CREATININE 0.95   < > 1.24*  --  1.10* 1.17*  CALCIUM 8.9   < > 8.6*  --  8.9 9.2  MG 1.7  --   --  1.8  --  1.7  PHOS  --   --   --  3.7  --   --    < > = values in this interval not displayed.   Recent Labs    06/15/17 0355  AST 35  ALT 21  ALKPHOS 122  BILITOT 0.7  PROT 8.1  ALBUMIN 4.1   Recent Labs    06/15/17 0355  10/22/17 0348 10/23/17 0257 10/24/17 0309  WBC 5.5   < > 5.7 8.3 5.9  NEUTROABS 2.7  --   --   --   --   HGB 12.3   < > 11.7* 11.5* 10.8*  HCT 37.9   < > 37.7 36.7 34.7*  MCV 75.2*   < > 74.7* 74.3* 74.1*  PLT 259   < > 191 187 200   < > = values in this interval not displayed.   Lab Results  Component Value Date   TSH 2.13 06/10/2014   Lab Results  Component Value Date   HGBA1C 7.1 (H) 10/21/2017   Lab Results  Component Value Date   CHOL 135 09/30/2015   HDL 62.50 09/30/2015   LDLCALC 59 09/30/2015   LDLDIRECT 66.0 01/27/2015   TRIG 71.0  09/30/2015   CHOLHDL 2 09/30/2015    Significant Diagnostic Results in last 30 days:  Dg Chest 2 View  Result Date: 10/20/2017 CLINICAL DATA:  Productive cough EXAM: CHEST - 2 VIEW COMPARISON:  06/23/2017 FINDINGS: The heart is moderately enlarged. Low lung volumes. Bibasilar atelectasis. No pneumothorax or pleural effusion. No interstitial edema. IMPRESSION: Cardiomegaly Bibasilar atelectasis. Electronically Signed   By: Arthur  Hoss M.D.   On: 10/20/2017 12:24   Dg Knee 1-2 Views Right  Result Date: 10/23/2017 CLINICAL DATA:  Sudden onset of pain and swelling today EXAM: RIGHT KNEE - 1-2 VIEW COMPARISON:  07/15/2012 FINDINGS: Osseous demineralization. Tricompartmental joint space narrowing and spur formation. No acute fracture, dislocation, or bone destruction. No knee joint effusion. IMPRESSION: Advanced tricompartmental osteoarthritic changes of the RIGHT knee. No acute abnormalities. Electronically Signed   By: Mark  Boles M.D.   On: 10/23/2017 13:18   Dg Chest Port 1 View  Result Date: 10/24/2017 CLINICAL DATA:  Pt had a new onset of productive cough today as well as increased SOB, pt states it has improved now - hx of CHF, htn, diabetes, seizures, PNA, COPD, dyspnea, Parkinson's , sleep apnea EXAM: PORTABLE CHEST 1 VIEW COMPARISON:  10/20/2017 FINDINGS: There is no focal parenchymal opacity. There is no pleural effusion or pneumothorax. There is stable cardiomegaly. The osseous structures are unremarkable. IMPRESSION: No active disease. Electronically Signed   By: Hetal  Patel   On: 10/24/2017 13:48    Assessment/Plan  1. Chronic diastolic CHF (congestive heart failure), NYHA class 2 (HCC) - carvedilol (COREG) 12.5 MG tablet; Take 1 tablet (12.5 mg total) by mouth 2 (two) times daily with a meal.  Dispense: 60 tablet; Refill: 0 - metolazone (ZAROXOLYN) 2.5 MG tablet; Take 1 tablet (2.5 mg total) by mouth See admin instructions. Take 1 tablet Monday and Friday only if weight is greater or  equal to 269  Dispense: 10 tablet; Refill: 0 - potassium chloride SA (K-DUR,KLOR-CON) 20 MEQ tablet; Take 2 tablets (40 mEq total) by mouth daily. On the day you take metolazone, take an extra 20 meq (60 meq total)  Dispense: 60 tablet; Refill: 0 - furosemide (LASIX) 80 MG tablet; Take 0.5-1 tablets (40-80 mg total) by mouth daily. Take 80 mg M-W-F, take 40 mg all other days  Dispense: 30 tablet; Refill: 0  2. Chronic obstructive pulmonary disease with acute exacerbation (HCC) - continue O2 @ 1L/min via [place] Q HS and during exercise - ipratropium-albuterol (DUONEB) 0.5-2.5 (3) MG/3ML SOLN; Take 3 mLs by nebulization every 4 (four) hours as needed.  Dispense: 360 mL; Refill: 0  3. Coronary artery disease, non-occlusive - no complaints of chest pains - atorvastatin (LIPITOR) 10 MG tablet; Take 1 tablet (  10 mg total) by mouth daily.  Dispense: 30 tablet; Refill: 0 - isosorbide mononitrate (IMDUR) 60 MG 24 hr tablet; Take 1 tablet (60 mg total) by mouth daily.  Dispense: 30 tablet; Refill: 0  4. Other chronic pancreatitis ([place]) - continue Zenpep   5. Uncontrolled type 2 diabetes mellitus with hyperglycemia (HCC) - Insulin Glargine (TOUJEO MAX SOLOSTAR) 300 UNIT/ML SOPN; Inject 30 Units into the skin at bedtime.  Dispense: 3 mL; Refill: 0 Lab Results  Component Value Date   HGBA1C 7.1 (H) 10/21/2017    6. Seizure disorder (HCC) - lamoTRIgine (LAMICTAL) 100 MG tablet; Take 1 tablet (100 mg total) by mouth 2 (two) times daily.  Dispense: 60 tablet; Refill: 0 - pregabalin (LYRICA) 100 MG capsule; Take 1 capsule (100 mg total) by mouth 2 (two) times daily.  Dispense: 60 capsule; Refill: 0  7. Recurrent depression (HCC) - sertraline (ZOLOFT) 50 MG tablet; Take 1 tablet (50 mg total) by mouth every evening.  Dispense: 30 tablet; Refill: 0  8. Parkinson's disease ([place]) - carbidopa-levodopa (SINEMET IR) 25-100 MG tablet; Take 1 tablet by mouth 3 (three) times daily.  Dispense: 90 tablet; Refill:  0  9. Rheumatoid arthritis, involving unspecified site, unspecified rheumatoid factor presence (HCC) - diclofenac sodium (VOLTAREN) 1 % GEL; Apply 2 g topically 4 (four) times daily.  Dispense: 100 g; Refill: 0   I have filled out patient's discharge paperwork and written prescriptions.  Patient will receive home health PT, and OT.  DME provided:  3-in-1 bedside commode, O2 at 1L/min via [place] at QHS and with exercise - stationary and portable.   Total discharge time: Greater than 30 minutes Greater than 50% was spent in counseling and coordination of care.   Discharge time involved coordination of the discharge process with social worker, nursing staff and therapy department. Medical justification for home health services/DME verified.     Durenda Age, NP [facility] [phone] (Monday-Friday 8:00 a.m. - 5:00 p.m.) [phone] (after hours)

## 2017-11-08 ENCOUNTER — Other Ambulatory Visit: Payer: Self-pay | Admitting: *Deleted

## 2017-11-08 ENCOUNTER — Other Ambulatory Visit: Payer: Self-pay | Admitting: Licensed Clinical Social Worker

## 2017-11-08 DIAGNOSIS — I5032 Chronic diastolic (congestive) heart failure: Secondary | ICD-10-CM | POA: Diagnosis not present

## 2017-11-08 DIAGNOSIS — N189 Chronic kidney disease, unspecified: Secondary | ICD-10-CM | POA: Diagnosis not present

## 2017-11-08 DIAGNOSIS — Z794 Long term (current) use of insulin: Secondary | ICD-10-CM | POA: Diagnosis not present

## 2017-11-08 DIAGNOSIS — I10 Essential (primary) hypertension: Secondary | ICD-10-CM | POA: Diagnosis not present

## 2017-11-08 DIAGNOSIS — E1140 Type 2 diabetes mellitus with diabetic neuropathy, unspecified: Secondary | ICD-10-CM | POA: Diagnosis not present

## 2017-11-08 DIAGNOSIS — D649 Anemia, unspecified: Secondary | ICD-10-CM | POA: Diagnosis not present

## 2017-11-08 DIAGNOSIS — G4089 Other seizures: Secondary | ICD-10-CM | POA: Diagnosis not present

## 2017-11-08 DIAGNOSIS — I509 Heart failure, unspecified: Secondary | ICD-10-CM | POA: Diagnosis not present

## 2017-11-08 DIAGNOSIS — G20 Parkinson's disease: Secondary | ICD-10-CM | POA: Diagnosis not present

## 2017-11-08 DIAGNOSIS — I2510 Atherosclerotic heart disease of native coronary artery without angina pectoris: Secondary | ICD-10-CM | POA: Diagnosis not present

## 2017-11-08 DIAGNOSIS — J9611 Chronic respiratory failure with hypoxia: Secondary | ICD-10-CM | POA: Diagnosis not present

## 2017-11-08 DIAGNOSIS — I5033 Acute on chronic diastolic (congestive) heart failure: Secondary | ICD-10-CM | POA: Diagnosis not present

## 2017-11-08 DIAGNOSIS — M48061 Spinal stenosis, lumbar region without neurogenic claudication: Secondary | ICD-10-CM | POA: Diagnosis not present

## 2017-11-08 DIAGNOSIS — G40909 Epilepsy, unspecified, not intractable, without status epilepticus: Secondary | ICD-10-CM | POA: Diagnosis not present

## 2017-11-08 DIAGNOSIS — J449 Chronic obstructive pulmonary disease, unspecified: Secondary | ICD-10-CM | POA: Diagnosis not present

## 2017-11-08 DIAGNOSIS — I130 Hypertensive heart and chronic kidney disease with heart failure and stage 1 through stage 4 chronic kidney disease, or unspecified chronic kidney disease: Secondary | ICD-10-CM | POA: Diagnosis not present

## 2017-11-08 DIAGNOSIS — I5030 Unspecified diastolic (congestive) heart failure: Secondary | ICD-10-CM | POA: Diagnosis not present

## 2017-11-08 DIAGNOSIS — G4733 Obstructive sleep apnea (adult) (pediatric): Secondary | ICD-10-CM | POA: Diagnosis not present

## 2017-11-08 NOTE — Patient Outreach (Signed)
[facility]) Care Management  11/08/2017  NYA MONDS [birth year] [id number]  [place] contacted [facility] and spoke with social worker Cameron Ali. [facility] CSW was informed that patient discharged back home yesterday and that [facility] PT/OT was set up with [facility] and that a 3n1 rollator and oxygen was ordered for her as well. [facility] CSW will refer to [place] at this time for transition of care. [facility] CSW will sign off at this time.  [name], BSW, MSW, [email or web address] Phone: [phone] Fax: [phone]

## 2017-11-08 NOTE — Patient Outreach (Signed)
[facility]) Care Management  11/08/2017  [name] September 14, 1945 [id number]   Referral received from LCSW as member was recently hospitalized for heart failure, discharged to SNF. She was discharged from SNF yesterday.  She has been active with [facility] for several years, initially with community care Freight forwarder and most recently with health coach.  Per chart, history of diabetes (A1C - 7.1), hypertension, CAD, COPD, and sleep apnea.  Call placed to member, no answer.  HIPAA compliant voice message left.  Outreach letter sent, will follow up within the next 4 business days.  Valente David, [place], MSN [place] [phone]

## 2017-11-12 DIAGNOSIS — N189 Chronic kidney disease, unspecified: Secondary | ICD-10-CM | POA: Diagnosis not present

## 2017-11-12 DIAGNOSIS — E1140 Type 2 diabetes mellitus with diabetic neuropathy, unspecified: Secondary | ICD-10-CM | POA: Diagnosis not present

## 2017-11-12 DIAGNOSIS — I2510 Atherosclerotic heart disease of native coronary artery without angina pectoris: Secondary | ICD-10-CM | POA: Diagnosis not present

## 2017-11-12 DIAGNOSIS — Z794 Long term (current) use of insulin: Secondary | ICD-10-CM | POA: Diagnosis not present

## 2017-11-12 DIAGNOSIS — I130 Hypertensive heart and chronic kidney disease with heart failure and stage 1 through stage 4 chronic kidney disease, or unspecified chronic kidney disease: Secondary | ICD-10-CM | POA: Diagnosis not present

## 2017-11-12 DIAGNOSIS — J449 Chronic obstructive pulmonary disease, unspecified: Secondary | ICD-10-CM | POA: Diagnosis not present

## 2017-11-12 DIAGNOSIS — D649 Anemia, unspecified: Secondary | ICD-10-CM | POA: Diagnosis not present

## 2017-11-12 DIAGNOSIS — G20 Parkinson's disease: Secondary | ICD-10-CM | POA: Diagnosis not present

## 2017-11-12 DIAGNOSIS — I5032 Chronic diastolic (congestive) heart failure: Secondary | ICD-10-CM | POA: Diagnosis not present

## 2017-11-13 DIAGNOSIS — E1140 Type 2 diabetes mellitus with diabetic neuropathy, unspecified: Secondary | ICD-10-CM | POA: Diagnosis not present

## 2017-11-13 DIAGNOSIS — I2510 Atherosclerotic heart disease of native coronary artery without angina pectoris: Secondary | ICD-10-CM | POA: Diagnosis not present

## 2017-11-13 DIAGNOSIS — G20 Parkinson's disease: Secondary | ICD-10-CM | POA: Diagnosis not present

## 2017-11-13 DIAGNOSIS — I130 Hypertensive heart and chronic kidney disease with heart failure and stage 1 through stage 4 chronic kidney disease, or unspecified chronic kidney disease: Secondary | ICD-10-CM | POA: Diagnosis not present

## 2017-11-13 DIAGNOSIS — Z794 Long term (current) use of insulin: Secondary | ICD-10-CM | POA: Diagnosis not present

## 2017-11-13 DIAGNOSIS — N189 Chronic kidney disease, unspecified: Secondary | ICD-10-CM | POA: Diagnosis not present

## 2017-11-13 DIAGNOSIS — I5032 Chronic diastolic (congestive) heart failure: Secondary | ICD-10-CM | POA: Diagnosis not present

## 2017-11-13 DIAGNOSIS — D649 Anemia, unspecified: Secondary | ICD-10-CM | POA: Diagnosis not present

## 2017-11-13 DIAGNOSIS — J449 Chronic obstructive pulmonary disease, unspecified: Secondary | ICD-10-CM | POA: Diagnosis not present

## 2017-11-14 ENCOUNTER — Other Ambulatory Visit: Payer: Self-pay | Admitting: *Deleted

## 2017-11-14 DIAGNOSIS — Z794 Long term (current) use of insulin: Secondary | ICD-10-CM | POA: Diagnosis not present

## 2017-11-14 DIAGNOSIS — Z09 Encounter for follow-up examination after completed treatment for conditions other than malignant neoplasm: Secondary | ICD-10-CM | POA: Diagnosis not present

## 2017-11-14 DIAGNOSIS — N189 Chronic kidney disease, unspecified: Secondary | ICD-10-CM | POA: Diagnosis not present

## 2017-11-14 DIAGNOSIS — E1140 Type 2 diabetes mellitus with diabetic neuropathy, unspecified: Secondary | ICD-10-CM | POA: Diagnosis not present

## 2017-11-14 DIAGNOSIS — Z9981 Dependence on supplemental oxygen: Secondary | ICD-10-CM | POA: Diagnosis not present

## 2017-11-14 DIAGNOSIS — I5032 Chronic diastolic (congestive) heart failure: Secondary | ICD-10-CM | POA: Diagnosis not present

## 2017-11-14 DIAGNOSIS — I130 Hypertensive heart and chronic kidney disease with heart failure and stage 1 through stage 4 chronic kidney disease, or unspecified chronic kidney disease: Secondary | ICD-10-CM | POA: Diagnosis not present

## 2017-11-14 DIAGNOSIS — I2510 Atherosclerotic heart disease of native coronary artery without angina pectoris: Secondary | ICD-10-CM | POA: Diagnosis not present

## 2017-11-14 DIAGNOSIS — D649 Anemia, unspecified: Secondary | ICD-10-CM | POA: Diagnosis not present

## 2017-11-14 DIAGNOSIS — J449 Chronic obstructive pulmonary disease, unspecified: Secondary | ICD-10-CM | POA: Diagnosis not present

## 2017-11-14 DIAGNOSIS — G20 Parkinson's disease: Secondary | ICD-10-CM | POA: Diagnosis not present

## 2017-11-14 NOTE — Patient Outreach (Signed)
[facility]) Care Management  11/14/2017  [name] 1945-11-04 [id number]   2nd attempt made to contact member since her discharge.  She report she is doing well, denies any shortness of breath or chest discomfort at this time.  State she has been compliant with medications and continue to have the support of her family, sister and brother in law.  They do the cooking as well as transportation to MD appointments.  State weights and blood sugars have both been stable since discharge.  Denies any urgent concerns.  Agrees to home visit within the next 2 weeks, will follow up telephonically next week.  [facility] CM Care Plan Problem One     Most Recent Value  Care Plan Problem One  Risk for readmission related to heart failure as evidenced by recent hospitalization  Role Documenting the Problem One  Care Management [place] for Problem One  Active  [facility] Long Term Goal   Member will not be readmitted to hospital within the next 31 days  [facility] Long Term Goal Start Date  11/14/17  Interventions for Problem One Long Term Goal  Discussed with member the importance of following discharge instructions, including follow up appointments, medications, diet, and home health involvement, to decrease the risk of readmission  [facility] CM Short Term Goal #1   Member will report compliance with taking and recording daily weights over the next 4 weeks  [facility] CM Short Term Goal #1 Start Date  11/14/17  Interventions for Short Term Goal #1  Member educated on heart failure zones and importance of daily weights and when to contact MD  [facility] CM Short Term Goal #2   Member will keep and attend appointment with cardiology within the next 2 weeks  [facility] CM Short Term Goal #2 Start Date  11/14/17  Interventions for Short Term Goal #2  Confirmed through EPIC that member has appointment scheduled with cardiology.  Confirmed she has transportation (sister or brother in law will take her)     Valente David,  Therapist, sports, MSN [place] [phone]

## 2017-11-19 ENCOUNTER — Ambulatory Visit: Payer: Medicare HMO | Admitting: Cardiology

## 2017-11-19 ENCOUNTER — Encounter: Payer: Self-pay | Admitting: Cardiology

## 2017-11-19 VITALS — BP 138/72 | HR 69 | Ht 61.0 in | Wt 265.0 lb

## 2017-11-19 DIAGNOSIS — I83893 Varicose veins of bilateral lower extremities with other complications: Secondary | ICD-10-CM | POA: Diagnosis not present

## 2017-11-19 DIAGNOSIS — E785 Hyperlipidemia, unspecified: Secondary | ICD-10-CM | POA: Diagnosis not present

## 2017-11-19 DIAGNOSIS — I2510 Atherosclerotic heart disease of native coronary artery without angina pectoris: Secondary | ICD-10-CM | POA: Diagnosis not present

## 2017-11-19 DIAGNOSIS — I5032 Chronic diastolic (congestive) heart failure: Secondary | ICD-10-CM | POA: Diagnosis not present

## 2017-11-19 DIAGNOSIS — I110 Hypertensive heart disease with heart failure: Secondary | ICD-10-CM | POA: Diagnosis not present

## 2017-11-19 DIAGNOSIS — E662 Morbid (severe) obesity with alveolar hypoventilation: Secondary | ICD-10-CM

## 2017-11-19 MED ORDER — ISOSORBIDE MONONITRATE ER 60 MG PO TB24: 60.0000 mg | ORAL_TABLET | Freq: Every day | ORAL | 3 refills | Status: DC

## 2017-11-19 NOTE — Assessment & Plan Note (Addendum)
On statin.  Follow-up is usually done by PCP.

## 2017-11-19 NOTE — Patient Instructions (Addendum)
NO MEDICATION CHANGES   NEW PRESCRIPTION SENT FOR 90 DAY SUPPLY -IMDUR 60 MG     YOUR NEW DRY WEIGHT IS [phone] - FOLLOW SLIDING SCALE USE OF FUROSEMIDE ( LASIX)    Your physician recommends that KEEP schedule a follow-up appointment  July 2019 with [facility]   Your physician recommends that you schedule a follow-up appointment in [place] ( OCT 2019)

## 2017-11-19 NOTE — Assessment & Plan Note (Signed)
Currently taking furosemide 80 mg on Monday, Wednesday, Friday and otherwise taking 40 mg. Sliding scale discussed.  Also PRN metolazone (which she is only used twice since hospitalization).

## 2017-11-19 NOTE — Assessment & Plan Note (Signed)
Blood pressure is relatively controlled today on carvedilol and Imdur.  Her other medications have been stopped during hospitalizations I am not sure why.  Will consider actually low-dose ARB depending on stable renal function.  Has upcoming OSA evaluation.

## 2017-11-19 NOTE — Assessment & Plan Note (Signed)
Relatively stable.  Possibly but may be related to deconditioning and muscle atrophy from her ankle issues. Prefer to use support stockings and foot elevation to increase the dose of Lasix, however she remains on a relatively significant Lasix for her diastolic heart failure.

## 2017-11-19 NOTE — Assessment & Plan Note (Signed)
Apparently her sleep study pending reschedule because she was in the hospital and was rescheduled.  Seeing neurology hopefully she can start on CPAP or BiPAP this will help her oxygenation as well. - She does have home oxygen which I recommend that she uses for sleeping, but also for exertion

## 2017-11-19 NOTE — Progress Notes (Signed)
PCP: [name], MD  Clinic Note: Chief Complaint  Patient presents with  . Hospitalization Follow-up    HPI: [name] is a 72 y.o. female with a PMH below who presents today for 4 month f/u for mostly diastolic dysfunction related heart failure along with obesity hypoventilation syndrome.Marland Kitchen  PMH: nonocclusive CAD by cath in 2012, DM II, HTN, morbid obesity with obesity hypoventilation syndrome and OSA not on CPAP, D-CHF, near syncope with palpitations and PACs on monitor, low risk Myoview 01/2016, dry wt 270-273 lbs, vein incompetence contributing to LE edema, CVA, sz, chronic pancreatitis --Last seen by me in April 2019 prior to her recent hospitalization.  [name] was last seen on July 08, 2017 by Rosaria Ferries, PA-C.  (As a follow-up from hospitalization in late December where she presented with a possible stroke type symptoms was thought to be a seizure during sleep).  She had some swelling and was diuresed with IV Lasix.  Was changed to alternating 40 mg a milligram every other day Lasix with metolazone. [name] noted that her breathing is doing okay, but was still sleeping propped up.  Noted that her weight is trended down to --> 264 pounds in the setting of being dehydrated.  Intended dry weight is roughly 275 pounds at home.  She talked about sodium restriction.  Made Lasix 80 mg on Monday and Friday with 40 mg on the other days.  Using metolazone as needed. Continued working on getting home oxygen and a sleep apnea machine-.  Apparently could not afford CPAP.  Recent Hospitalizations:   Admitted October 20, 2017 for "heart failure "(clearly diastolic with COPD/OSA OHS component) -IV diuresis dry weight now is 262-206 263 pounds.    Studies Personally Reviewed - (if available, images/films reviewed: From Epic Chart or Care Everywhere)  Echo May 2019: EF 55-60%.  No wall motion normality.  Moderate LA dilation.  Mild LA dilation.  Peak PA pressures estimated 57  mmHg (moderate)  Interval History: [name] presents today actually noticing that her weights have been quite stable she is keeping track.  Weights are actually almost 10 pounds less than they have been.  She feels better and is only sleeping on 2 pillows because of comfort not because of PND/orthopnea symptoms.  Swelling and varicose veins - pretty stable. She actually notes that despite being so debilitated walking her exertional dyspnea has notably improved.  She otherwise is doing fairly well without any sensation of rapid irregular heartbeats palpitations.  Edema being controlled now.  No chest tightness/pressure with rest or exertion. No syncope/near syncope or TIA symptoms fugax symptoms.   Unfortunately she has little recourse because she lives alone when she has symptoms then to call EMS.  ROS: A comprehensive was performed. Review of Systems  Constitutional: Positive for malaise/fatigue (Just does not have energy.  Partly because she is so disabled.) and weight loss (Dry weight now 262-263 pounds at home which correlates with 265 Here..).  HENT: Negative for congestion and nosebleeds.   Respiratory: Positive for shortness of breath. Negative for cough.   Gastrointestinal: Positive for heartburn. Negative for blood in stool and melena.  Genitourinary: Negative for hematuria.  Musculoskeletal: Positive for falls (Off and on, no recent) and joint pain (Knees and ankles as well as most joints.).  Neurological: Positive for dizziness (Sometimes orthostatic in the morning.).  Psychiatric/Behavioral: Positive for depression (She does seem to have somewhat sad affect and feels it dejected.). Negative for memory loss. The patient has insomnia.  All other systems reviewed and are negative.  I have reviewed and (if needed) personally updated the patient's problem list, medications, allergies, past medical and surgical history, social and family history.   Past Medical History:  Diagnosis Date    . Abnormal liver function     in the past.  . Anemia   . Arthritis    all over  . Bruises easily   . Cataract    right eye;immature  . CHF (congestive heart failure) ([place])   . Chronic back pain    stenosis  . Chronic cough   . Chronic kidney disease   . COPD (chronic obstructive pulmonary disease) ([place])   . Demand myocardial infarction [facility]) 2012   Demand Infarction in setting of Pancreatitis --> mild Troponin elevation, Non-obstructive CAD  . Depression    takes Abilify daily as well as Zoloft  . Diastolic heart failure 1610   Grade 1 diastolic Dysfunction by Echo   . Diverticulosis   . DM (diabetes mellitus) ([place])    takes Victoza daily as well as Lantus and Humalog  . Dyspnea   . Empty sella ([place])    on MRI in 2009.  . Glaucoma   . Headache(784.0)    last migraine-4-49yrs ago  . History of blood transfusion    no abnormal reaction noted  . History of colon polyps    benign  . History of influenza    antibiotic given yesterday along with taking Delsym cough syrup  . HTN (hypertension)    takes [employer] daily  . Hyperlipidemia    takes Lipitor daily  . Joint swelling   . Nocturia   . Obstructive sleep apnea   . Pancreatitis    takes Pancrelipase daily  . Parkinson's disease ([place])    takes Sinemet daily  . Peripheral edema    takes Lasix daily  . Peripheral neuropathy   . Pneumonia 2012  . Seizures ([place])    takes Lamictal daily and Primidone nightly;last seizure 2wks ago  . Urinary frequency   . Urinary urgency   . Varicose veins of both lower extremities with pain     Past Surgical History:  Procedure Laterality Date  . ABDOMINAL HYSTERECTOMY    . APPENDECTOMY    . BACK SURGERY    . CARDIAC CATHETERIZATION  2009/March 2012   2009: (Dr. Alvie Heidelberg) Nonobstructive CAD; 2012: Minimal CAD --> false positive stress test  . Cardiac Event Monitor  September-October 2017   Sinus rhythm with occasional PACs and artifact. No arrhythmias besides  one short run of tachycardia.  . CHOLECYSTECTOMY    . COLONOSCOPY N/A 08/15/2012   Procedure: COLONOSCOPY;  Surgeon: Arta Silence, MD;  Location: [facility];  Service: Endoscopy;  Laterality: N/A;  . COLONOSCOPY WITH PROPOFOL Left 06/17/2017   Procedure: COLONOSCOPY WITH PROPOFOL;  Surgeon: [name], MD;  Location: [place];  Service: Gastroenterology;  Laterality: Left;  . ESOPHAGOGASTRODUODENOSCOPY    . eye cysts Bilateral   . LASIK    . NM MYOVIEW LTD  01/2016   LOW RISK. No ischemia or infarction.  Marland Kitchen RECTAL POLYPECTOMY    . TONSILLECTOMY    . TRANSTHORACIC ECHOCARDIOGRAM  01/2016; 05/2018   A) Normal LV chamber size with mod LVH pattern. EF 50-55%. Severe LA dilation. Mod RA dilation. PAP elevated at 38 mmHg;; B) Mild concentric LVH.  EF 55-60% & no RWMA.  Grade 2 DD.  Diastolic flattening of the ventricular septum == ? elevated PAP.  Mild aortic valve  calcification/sclerosis.  Mod LA dilation.  Mild RV dilation & Mod RA dilation  . TRANSTHORACIC ECHOCARDIOGRAM  10/2017   EF 55-60%.  No wall motion normality.  Moderate LA dilation.  Mild LA dilation.  Peak PA pressures estimated 57 mmHg (moderate)  . TUBAL LIGATION    . vaginal cyst removed     several times    Current Meds  Medication Sig  . Semaglutide (OZEMPIC) 0.25 or 0.5 MG/DOSE SOPN Inject 0.5 mg into the skin once a week.    Allergies  Allergen Reactions  . Penicillins Hives    Has patient had a PCN reaction causing immediate rash, facial/tongue/throat swelling, SOB or lightheadedness with hypotension: Yes Has patient had a PCN reaction causing severe rash involving mucus membranes or skin necrosis: No Has patient had a PCN reaction that required hospitalization: No Has patient had a PCN reaction occurring within the last 10 years: No If all of the above answers are "NO", then may proceed with Cephalosporin use.   . Sulfa Antibiotics Hives  . Aspirin Other (See Comments)     On aspirin 81 mg - Rectal  bleeding in dec 2018  . Codeine     Headache and makes the patient feel "off"  . Other Rash    [place]    Social History   Tobacco Use  . Smoking status: Former Smoker    Packs/day: 0.50    Years: 25.00    Pack years: 12.50    Types: Cigarettes  . Smokeless tobacco: Never Used  . Tobacco comment: quit smoking 20+ytrs ago  Substance Use Topics  . Alcohol use: No  . Drug use: No   Social History   Social History Narrative   She lives in [place]. She has lots of family in the area and is accompanied by her sister.   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   She has a distant history of smoking, quit 20 years ago.    family history includes Allergies in her father; Cancer in her other; Diabetes in her brother, mother, sister, and son; Heart disease in her father, mother, and sister; Heart failure in her father; Hyperlipidemia in her father, mother, and sister; Hypertension in her brother, father, mother, sister, sister, and son.  Wt Readings from Last 3 Encounters:  11/19/17 265 lb (120.2 kg)  11/14/17 262 lb (118.8 kg)  11/06/17 267 lb 8 oz (121.3 kg)    PHYSICAL EXAM BP 138/72   Pulse 69   Ht 5\' 1"  (1.549 m)   Wt 265 lb (120.2 kg)   BMI 50.07 kg/m  Physical Exam  Constitutional: She is oriented to person, place, and time. She appears well-developed. No distress.  Morbidly obese somewhat debilitated woman but in no acute distress.  Well-groomed.  HENT:  Head: Normocephalic and atraumatic.  Eyes: EOM are normal.  Neck: Normal range of motion. Neck supple. No hepatojugular reflux and no JVD present. Carotid bruit is not present.  Cardiovascular: Normal rate, regular rhythm, S1 normal, S2 normal, intact distal pulses and normal pulses.  No extrasystoles are present. PMI is not displaced (Cannot palpate). Exam reveals distant heart sounds. Exam reveals no gallop, no S4 and no friction rub.  Murmur heard.  Low-pitched blowing early  systolic murmur is present at the lower left sternal border. Pulmonary/Chest: Effort normal and breath sounds normal. No respiratory distress. She has no wheezes. She has no rales.  Abdominal: Soft. Bowel sounds are normal. She exhibits no  distension. There is no tenderness.  Morbidly obese, Unable to palpate HSM  Musculoskeletal: She exhibits edema (Trivial, difficult to assess diuresis, not noticeable).  Neurological: She is alert and oriented to person, place, and time.  Skin:  Bilateral proximal lower leg (knee) swollen varicose veins that are nontender.  Psychiatric: She has a normal mood and affect. Her behavior is normal. Judgment and thought content normal.    Adult ECG Report  Rate: 69;  Rhythm: normal sinus rhythm, sinus arrhythmia and Possible fusion complexes; stable nonspecific ST-T wave changes.  Otherwise normal axis, intervals and durations.  Narrative Interpretation: Stable EKG   Other studies Reviewed: Additional studies/ records that were reviewed today include:  Recent Labs:    Lab Results  Component Value Date   CREATININE 1.17 (H) 10/25/2017   BUN 19 10/25/2017   NA 133 (L) 10/25/2017   K 3.5 10/25/2017   CL 87 (L) 10/25/2017   CO2 36 (H) 10/25/2017    ASSESSMENT / PLAN: Problem List Items Addressed This Visit    Varicose veins of bilateral lower extremities with other complications    Relatively stable.  Possibly but may be related to deconditioning and muscle atrophy from her ankle issues. Prefer to use support stockings and foot elevation to increase the dose of Lasix, however she remains on a relatively significant Lasix for her diastolic heart failure.      Relevant Medications   isosorbide mononitrate (IMDUR) 60 MG 24 hr tablet   Obesity hypoventilation syndrome (HCC) (Chronic)    Apparently her sleep study pending reschedule because she was in the hospital and was rescheduled.  Seeing neurology hopefully she can start on CPAP or BiPAP this will help  her oxygenation as well. - She does have home oxygen which I recommend that she uses for sleeping, but also for exertion      Relevant Medications   Semaglutide (OZEMPIC) 0.25 or 0.5 MG/DOSE SOPN   Hypertensive heart disease with chronic diastolic congestive heart failure (HCC) - Primary (Chronic)    Blood pressure is relatively controlled today on carvedilol and Imdur.  Her other medications have been stopped during hospitalizations I am not sure why.  Will consider actually low-dose ARB depending on stable renal function.  Has upcoming OSA evaluation.      Relevant Medications   isosorbide mononitrate (IMDUR) 60 MG 24 hr tablet   Dyslipidemia, goal LDL below 100 (Chronic)    On statin.  Follow-up is usually done by PCP.      Relevant Medications   isosorbide mononitrate (IMDUR) 60 MG 24 hr tablet   Coronary artery disease, non-occlusive (Chronic)    Nonocclusive vessel by cath with follow-up Myoview negative. No active angina symptoms. Remains on statin and beta-blocker.  Is also on Imdur for combination of hydralazine at low reduction.  This was increased to 60mg  prior to her hospitalization and will continue.      Relevant Medications   isosorbide mononitrate (IMDUR) 60 MG 24 hr tablet   Other Relevant Orders   EKG 12-Lead   Chronic diastolic CHF (congestive heart failure), NYHA class 2 (HCC) (Chronic)    Currently taking furosemide 80 mg on Monday, Wednesday, Friday and otherwise taking 40 mg. Sliding scale discussed.  Also PRN metolazone (which she is only used twice since hospitalization).      Relevant Medications   isosorbide mononitrate (IMDUR) 60 MG 24 hr tablet      I spent a total of 30 minutes with the patient and chart review. >  50% of the time was spent in direct patient consultation.   Current medicines are reviewed at length with the patient today.  (+/- concerns) still not sure how to take her meds The following changes have been made:  See  below  Patient Instructions  NO MEDICATION CHANGES   NEW PRESCRIPTION [place] WEIGHT IS [phone] - [place] USE OF FUROSEMIDE ( LASIX)    Your physician recommends that KEEP schedule a follow-up appointment  July 2019 with [facility]   Your physician recommends that you schedule a follow-up appointment in [place] ( OCT 2019)      Studies Ordered:   Orders Placed This Encounter  Procedures  . EKG 12-Lead      Glenetta Hew, M.D., M.S. Interventional Cardiologist   Pager # [phone] Phone # [phone] [address] [place], [place] [address]   Thank you for choosing Heartcare at [facility]!!

## 2017-11-19 NOTE — Assessment & Plan Note (Signed)
Nonocclusive vessel by cath with follow-up Myoview negative. No active angina symptoms. Remains on statin and beta-blocker.  Is also on Imdur for combination of hydralazine at low reduction.  This was increased to 60mg  prior to her hospitalization and will continue.

## 2017-11-20 DIAGNOSIS — G20 Parkinson's disease: Secondary | ICD-10-CM | POA: Diagnosis not present

## 2017-11-20 DIAGNOSIS — I130 Hypertensive heart and chronic kidney disease with heart failure and stage 1 through stage 4 chronic kidney disease, or unspecified chronic kidney disease: Secondary | ICD-10-CM | POA: Diagnosis not present

## 2017-11-20 DIAGNOSIS — I5032 Chronic diastolic (congestive) heart failure: Secondary | ICD-10-CM | POA: Diagnosis not present

## 2017-11-20 DIAGNOSIS — J449 Chronic obstructive pulmonary disease, unspecified: Secondary | ICD-10-CM | POA: Diagnosis not present

## 2017-11-20 DIAGNOSIS — E1140 Type 2 diabetes mellitus with diabetic neuropathy, unspecified: Secondary | ICD-10-CM | POA: Diagnosis not present

## 2017-11-20 DIAGNOSIS — D649 Anemia, unspecified: Secondary | ICD-10-CM | POA: Diagnosis not present

## 2017-11-20 DIAGNOSIS — N189 Chronic kidney disease, unspecified: Secondary | ICD-10-CM | POA: Diagnosis not present

## 2017-11-20 DIAGNOSIS — Z794 Long term (current) use of insulin: Secondary | ICD-10-CM | POA: Diagnosis not present

## 2017-11-20 DIAGNOSIS — I2510 Atherosclerotic heart disease of native coronary artery without angina pectoris: Secondary | ICD-10-CM | POA: Diagnosis not present

## 2017-11-21 ENCOUNTER — Other Ambulatory Visit: Payer: Self-pay | Admitting: *Deleted

## 2017-11-21 NOTE — Patient Outreach (Signed)
[place] [facility]) Care Management  11/21/2017  [facility] [birth year] [id number]   Weekly transition of care call placed to member's sister (per her request during last contact).  Sister state member is sleeping, but has not had any complaints of chest pain or shortness of breath.  Report weight has remained stable, compliant with medications.  Unable to discuss health further with member, sister made aware of home visit scheduled for next week.    [facility] CM Care Plan Problem One     Most Recent Value  Care Plan Problem One  Risk for readmission related to heart failure as evidenced by recent hospitalization  Role Documenting the Problem One  Care Management [place] for Problem One  Active  [facility] Long Term Goal   Member will not be readmitted to hospital within the next 31 days  [facility]N Long Term Goal Start Date  11/14/17  Interventions for Problem One Long Term Goal  Sister educated on appropriate low sodium/diabetic diet as she prepares meals for member.    [facility]N CM Short Term Goal #1   Member will report compliance with taking and recording daily weights over the next 4 weeks  [facility] CM Short Term Goal #1 Start Date  11/14/17  Interventions for Short Term Goal #1  Sister educated on importance of daily weights in effort to manage heart failure  THN CM Short Term Goal #2   Member will keep and attend appointment with cardiology within the next 2 weeks  [facility] CM Short Term Goal #2 Start Date  11/14/17  [facility] CM Short Term Goal #2 Met Date  11/21/17     Valente David, RN, MSN [place] [facility] [phone]

## 2017-11-22 ENCOUNTER — Ambulatory Visit: Payer: Medicare HMO | Admitting: Neurology

## 2017-11-24 ENCOUNTER — Encounter: Payer: Self-pay | Admitting: *Deleted

## 2017-11-25 ENCOUNTER — Other Ambulatory Visit: Payer: Self-pay | Admitting: *Deleted

## 2017-11-25 ENCOUNTER — Encounter: Payer: Self-pay | Admitting: *Deleted

## 2017-11-25 DIAGNOSIS — Z794 Long term (current) use of insulin: Secondary | ICD-10-CM | POA: Diagnosis not present

## 2017-11-25 DIAGNOSIS — I5032 Chronic diastolic (congestive) heart failure: Secondary | ICD-10-CM | POA: Diagnosis not present

## 2017-11-25 DIAGNOSIS — E1140 Type 2 diabetes mellitus with diabetic neuropathy, unspecified: Secondary | ICD-10-CM | POA: Diagnosis not present

## 2017-11-25 DIAGNOSIS — G20 Parkinson's disease: Secondary | ICD-10-CM | POA: Diagnosis not present

## 2017-11-25 DIAGNOSIS — I130 Hypertensive heart and chronic kidney disease with heart failure and stage 1 through stage 4 chronic kidney disease, or unspecified chronic kidney disease: Secondary | ICD-10-CM | POA: Diagnosis not present

## 2017-11-25 DIAGNOSIS — N189 Chronic kidney disease, unspecified: Secondary | ICD-10-CM | POA: Diagnosis not present

## 2017-11-25 DIAGNOSIS — I2510 Atherosclerotic heart disease of native coronary artery without angina pectoris: Secondary | ICD-10-CM | POA: Diagnosis not present

## 2017-11-25 DIAGNOSIS — J449 Chronic obstructive pulmonary disease, unspecified: Secondary | ICD-10-CM | POA: Diagnosis not present

## 2017-11-25 DIAGNOSIS — D649 Anemia, unspecified: Secondary | ICD-10-CM | POA: Diagnosis not present

## 2017-11-25 NOTE — Patient Outreach (Signed)
[name]) Care Management   11/25/2017  [name] [birth year] [id number]  [name] is an 73 y.o. female  Subjective:   Member alert and oriented x3, complains of chronic back and ankle pain.  Report compliance with medications without problems.  Objective:   Review of Systems  Constitutional: Negative.   HENT: Negative.   Eyes: Negative.   Respiratory: Negative.   Cardiovascular: Negative.   Gastrointestinal: Negative.   Genitourinary: Negative.   Musculoskeletal: Positive for joint pain.  Skin: Negative.   Neurological: Negative.   Endo/Heme/Allergies: Negative.   Psychiatric/Behavioral: Negative.     Physical Exam  Constitutional: She is oriented to person, place, and time. She appears well-developed and well-nourished.  Neck: Normal range of motion.  Cardiovascular: Normal rate, regular rhythm and normal heart sounds.  Respiratory: Effort normal and breath sounds normal.  GI: Soft. Bowel sounds are normal.  Musculoskeletal: Normal range of motion.  Neurological: She is alert and oriented to person, place, and time.  Skin: Skin is warm and dry.   BP (!) 142/78 (BP Location: Right Arm, Patient Position: Sitting, Cuff Size: Normal)   Pulse 61   Resp 18   Ht 1.549 m (5' 1" )   Wt 260 lb (117.9 kg)   SpO2 95%   BMI 49.13 kg/m   Encounter Medications:   Outpatient Encounter Medications as of 11/25/2017  Medication Sig  . acetaminophen (TYLENOL) 500 MG tablet Take 500 mg by mouth every 4 (four) hours as needed (for pain).   Marland Kitchen atorvastatin (LIPITOR) 10 MG tablet Take 1 tablet (10 mg total) by mouth daily.  . B Complex-C (B-COMPLEX WITH VITAMIN C) tablet Take 1 tablet by mouth daily.  . Blood Glucose Monitoring Suppl (ACCU-CHEK NANO SMARTVIEW) W/DEVICE KIT Use to check blood sugar 3 times per day dx code E11.65  . carbidopa-levodopa (SINEMET IR) 25-100 MG tablet Take 1 tablet by mouth 3 (three) times daily.  . carvedilol (COREG) 12.5 MG  tablet Take 1 tablet (12.5 mg total) by mouth 2 (two) times daily with a meal.  . Cholecalciferol (VITAMIN D-3) 1000 units CAPS Take 1,000 Units by mouth daily.  . cholestyramine (QUESTRAN) 4 g packet Take 1 packet (4 g total) by mouth 2 (two) times daily as needed (for diarrhea).  . diclofenac sodium (VOLTAREN) 1 % GEL Apply 2 g topically 4 (four) times daily.  . furosemide (LASIX) 80 MG tablet Take 0.5-1 tablets (40-80 mg total) by mouth daily. Take 80 mg M-W-F, take 40 mg all other days  . Insulin Glargine (TOUJEO MAX SOLOSTAR) 300 UNIT/ML SOPN Inject 30 Units into the skin at bedtime.  . Insulin Pen Needle 31G X 8 MM MISC Use to inject medication 4 times daily  . isosorbide mononitrate (IMDUR) 60 MG 24 hr tablet Take 1 tablet (60 mg total) by mouth daily.  Marland Kitchen lamoTRIgine (LAMICTAL) 100 MG tablet Take 1 tablet (100 mg total) by mouth 2 (two) times daily.  . Menthol, Topical Analgesic, 3.5 % GEL Apply 1 application topically as needed (for neuropathy in feet).  . metolazone (ZAROXOLYN) 2.5 MG tablet Take 1 tablet (2.5 mg total) by mouth See admin instructions. Take 1 tablet Monday and Friday only if weight is greater or equal to 269  . Multiple Vitamins-Minerals (MULTIVITAMIN WITH MINERALS) tablet Take 1 tablet by mouth daily.  . Pancrelipase, Lip-Prot-Amyl, 25000 UNITS CPEP Take 25,000-50,000 Units by mouth See admin instructions. 50,000 units with breakfast then 25,000 units with lunch then 50,000 units  with dinner (evening meal)  . potassium chloride SA (K-DUR,KLOR-CON) 20 MEQ tablet Take 2 tablets (40 mEq total) by mouth daily. On the day you take metolazone, take an extra 20 meq (60 meq total)  . pregabalin (LYRICA) 100 MG capsule Take 1 capsule (100 mg total) by mouth 2 (two) times daily.  . Semaglutide (OZEMPIC) 0.25 or 0.5 MG/DOSE SOPN Inject 0.5 mg into the skin once a week.  . sertraline (ZOLOFT) 50 MG tablet Take 1 tablet (50 mg total) by mouth every evening.  .  chlorpheniramine-HYDROcodone (TUSSIONEX) 10-8 MG/5ML SUER Take 5 mLs by mouth every 12 (twelve) hours as needed for cough. (Patient not taking: Reported on 11/25/2017)  . ipratropium-albuterol (DUONEB) 0.5-2.5 (3) MG/3ML SOLN Take 3 mLs by nebulization every 4 (four) hours as needed. (Patient not taking: Reported on 11/25/2017)   No facility-administered encounter medications on file as of 11/25/2017.     Functional Status:   In your present state of health, do you have any difficulty performing the following activities: 11/25/2017 10/21/2017  Hearing? N N  Vision? N N  Difficulty concentrating or making decisions? N N  Walking or climbing stairs? Y Y  Dressing or bathing? Y N  Doing errands, shopping? [name]  Preparing Food and eating ? Y -  Using the Toilet? N -  In the past six months, have you accidently leaked urine? Y -  Do you have problems with loss of bowel control? N -  Managing your Medications? Y -  Managing your Finances? Y -  Housekeeping or managing your Housekeeping? Y -  Some recent data might be hidden    Fall/Depression Screening:    Fall Risk  11/25/2017 07/30/2017 02/28/2017  Falls in the past year? No No Yes  Number falls in past yr: - - 2 or more  Injury with Fall? - - No  Risk Factor Category  - - High Fall Risk  Risk for fall due to : - - History of fall(s);Impaired balance/gait;Impaired mobility  Follow up - - Education provided;Falls prevention discussed   PHQ 2/9 Scores 11/25/2017 02/28/2017 02/15/2017 10/08/2016 09/05/2016 07/23/2016 06/22/2016  PHQ - 2 Score 0 0 0 0 0 0 0    Assessment:    Met with member at scheduled time, sister available to provide additional information during most of the visit.  Initial assessment complete, no distress noted.  Currently active with [facility] for home health, will have home health aide starting today, but state she will only have 2 more weeks with [facility].  She does not have a plan for assistance with bathing/dressing once their  time is complete.  Lives with sister, however sister is not able to help much due to previous back surgery as well as preparing for another surgery.  She is open to other options, aware of referral to LCSW.  Appointment with cardiology complete last week, next appointment in July.  Appointment with neurology next week.  Denies any urgent concerns, advised to contact this care manager with questions.  Provided with this care manager's contact information.  Plan:   Will follow up next week for weekly transition of care call. Will place referral to LCSW regarding possible assistance with personal care services.  THN CM Care Plan Problem One     Most Recent Value  Care Plan Problem One  Risk for readmission related to heart failure as evidenced by recent hospitalization  Role Documenting the Problem One  Care Management [place] for Problem One  Active  THN Long Term Goal   Member will not be readmitted to hospital within the next 31 days  THN Long Term Goal Start Date  11/14/17  Interventions for Problem One Long Term Goal  Provided member with [facility] calendar tool book.  Educated on heart failure action plan/zones, provided with copy  THN CM Short Term Goal #1   Member will report compliance with taking and recording daily weights over the next 4 weeks  THN CM Short Term Goal #1 Start Date  11/14/17  Interventions for Short Term Goal #1  Medications reviewed in the home, particularly diuretics and its parameters.  THN CM Short Term Goal #2   Member will weigh and record reading daily over the next 4 weeks  THN CM Short Term Goal #2 Start Date  11/25/17  Interventions for Short Term Goal #2  Provided member with copy of log for recording readings, educated on when to contact MD.     Valente David, RN, MSN [place] Manager [phone]

## 2017-11-26 DIAGNOSIS — E1140 Type 2 diabetes mellitus with diabetic neuropathy, unspecified: Secondary | ICD-10-CM | POA: Diagnosis not present

## 2017-11-26 DIAGNOSIS — G20 Parkinson's disease: Secondary | ICD-10-CM | POA: Diagnosis not present

## 2017-11-26 DIAGNOSIS — D649 Anemia, unspecified: Secondary | ICD-10-CM | POA: Diagnosis not present

## 2017-11-26 DIAGNOSIS — I5032 Chronic diastolic (congestive) heart failure: Secondary | ICD-10-CM | POA: Diagnosis not present

## 2017-11-26 DIAGNOSIS — I130 Hypertensive heart and chronic kidney disease with heart failure and stage 1 through stage 4 chronic kidney disease, or unspecified chronic kidney disease: Secondary | ICD-10-CM | POA: Diagnosis not present

## 2017-11-26 DIAGNOSIS — J449 Chronic obstructive pulmonary disease, unspecified: Secondary | ICD-10-CM | POA: Diagnosis not present

## 2017-11-26 DIAGNOSIS — N189 Chronic kidney disease, unspecified: Secondary | ICD-10-CM | POA: Diagnosis not present

## 2017-11-26 DIAGNOSIS — Z794 Long term (current) use of insulin: Secondary | ICD-10-CM | POA: Diagnosis not present

## 2017-11-26 DIAGNOSIS — I2510 Atherosclerotic heart disease of native coronary artery without angina pectoris: Secondary | ICD-10-CM | POA: Diagnosis not present

## 2017-11-27 DIAGNOSIS — N189 Chronic kidney disease, unspecified: Secondary | ICD-10-CM | POA: Diagnosis not present

## 2017-11-27 DIAGNOSIS — D649 Anemia, unspecified: Secondary | ICD-10-CM | POA: Diagnosis not present

## 2017-11-27 DIAGNOSIS — I2510 Atherosclerotic heart disease of native coronary artery without angina pectoris: Secondary | ICD-10-CM | POA: Diagnosis not present

## 2017-11-27 DIAGNOSIS — G20 Parkinson's disease: Secondary | ICD-10-CM | POA: Diagnosis not present

## 2017-11-27 DIAGNOSIS — I130 Hypertensive heart and chronic kidney disease with heart failure and stage 1 through stage 4 chronic kidney disease, or unspecified chronic kidney disease: Secondary | ICD-10-CM | POA: Diagnosis not present

## 2017-11-27 DIAGNOSIS — J449 Chronic obstructive pulmonary disease, unspecified: Secondary | ICD-10-CM | POA: Diagnosis not present

## 2017-11-27 DIAGNOSIS — Z794 Long term (current) use of insulin: Secondary | ICD-10-CM | POA: Diagnosis not present

## 2017-11-27 DIAGNOSIS — E1140 Type 2 diabetes mellitus with diabetic neuropathy, unspecified: Secondary | ICD-10-CM | POA: Diagnosis not present

## 2017-11-27 DIAGNOSIS — I5032 Chronic diastolic (congestive) heart failure: Secondary | ICD-10-CM | POA: Diagnosis not present

## 2017-11-28 ENCOUNTER — Other Ambulatory Visit: Payer: Self-pay

## 2017-11-28 DIAGNOSIS — I130 Hypertensive heart and chronic kidney disease with heart failure and stage 1 through stage 4 chronic kidney disease, or unspecified chronic kidney disease: Secondary | ICD-10-CM | POA: Diagnosis not present

## 2017-11-28 DIAGNOSIS — E1140 Type 2 diabetes mellitus with diabetic neuropathy, unspecified: Secondary | ICD-10-CM | POA: Diagnosis not present

## 2017-11-28 DIAGNOSIS — I5032 Chronic diastolic (congestive) heart failure: Secondary | ICD-10-CM | POA: Diagnosis not present

## 2017-11-28 DIAGNOSIS — E559 Vitamin D deficiency, unspecified: Secondary | ICD-10-CM | POA: Diagnosis not present

## 2017-11-28 NOTE — Patient Outreach (Signed)
[facility]) Care Management  11/28/2017  [name] [birth year] [id number]  Initial outreach to patient to follow up on referral for providing in-home aide resources. BSW introduced self to patient and reason for call. Patient indicates no need for aide services stating "I'm doing a little better now". Patient requested BSW speak with her sister, [name].  Patients sister, [name], present in the home and spoke with BSW. BSW re-introduced self and reason for patient referral to social work. [name] stated "I'm considered as an in-home aide. We don't need an outsider". [name] confirmed she felt the patient was improving. Her major concerns included patients continued use of a cane while ambulating instead of a walker as well as patients oxygen tanks being too heavy. BSW encouraged [name] to continue reminding patient to utilize her walker for safer ambulation. [name] stated "she's hard headed".   BSW inquired further about patients oxygen tank needs. [name] states the portable tanks that were delivered are very heavy and difficult for her and the patient to maneuver. [name] stated she called [facility] last week requesting smaller ones but has not received any. Patient has a physician appointment today at [place] encouraged [name] to mention concern for portable tank size to physician and to call [facility] after patients appointment to inquire status of obtaining smaller tanks.  BSW to close discipline due to denial of any social work needs by both the patient and her sister. BSW will notify referring RN CM, Valente David of discipline closure.  Daneen Schick, Arita Miss, CDP Social Worker [phone]

## 2017-11-29 DIAGNOSIS — Z794 Long term (current) use of insulin: Secondary | ICD-10-CM | POA: Diagnosis not present

## 2017-11-29 DIAGNOSIS — I2510 Atherosclerotic heart disease of native coronary artery without angina pectoris: Secondary | ICD-10-CM | POA: Diagnosis not present

## 2017-11-29 DIAGNOSIS — D649 Anemia, unspecified: Secondary | ICD-10-CM | POA: Diagnosis not present

## 2017-11-29 DIAGNOSIS — E1140 Type 2 diabetes mellitus with diabetic neuropathy, unspecified: Secondary | ICD-10-CM | POA: Diagnosis not present

## 2017-11-29 DIAGNOSIS — I130 Hypertensive heart and chronic kidney disease with heart failure and stage 1 through stage 4 chronic kidney disease, or unspecified chronic kidney disease: Secondary | ICD-10-CM | POA: Diagnosis not present

## 2017-11-29 DIAGNOSIS — I5032 Chronic diastolic (congestive) heart failure: Secondary | ICD-10-CM | POA: Diagnosis not present

## 2017-11-29 DIAGNOSIS — N189 Chronic kidney disease, unspecified: Secondary | ICD-10-CM | POA: Diagnosis not present

## 2017-11-29 DIAGNOSIS — G20 Parkinson's disease: Secondary | ICD-10-CM | POA: Diagnosis not present

## 2017-11-29 DIAGNOSIS — J449 Chronic obstructive pulmonary disease, unspecified: Secondary | ICD-10-CM | POA: Diagnosis not present

## 2017-12-02 DIAGNOSIS — I5032 Chronic diastolic (congestive) heart failure: Secondary | ICD-10-CM | POA: Diagnosis not present

## 2017-12-02 DIAGNOSIS — I2510 Atherosclerotic heart disease of native coronary artery without angina pectoris: Secondary | ICD-10-CM | POA: Diagnosis not present

## 2017-12-02 DIAGNOSIS — N189 Chronic kidney disease, unspecified: Secondary | ICD-10-CM | POA: Diagnosis not present

## 2017-12-02 DIAGNOSIS — I130 Hypertensive heart and chronic kidney disease with heart failure and stage 1 through stage 4 chronic kidney disease, or unspecified chronic kidney disease: Secondary | ICD-10-CM | POA: Diagnosis not present

## 2017-12-02 DIAGNOSIS — J449 Chronic obstructive pulmonary disease, unspecified: Secondary | ICD-10-CM | POA: Diagnosis not present

## 2017-12-02 DIAGNOSIS — G20 Parkinson's disease: Secondary | ICD-10-CM | POA: Diagnosis not present

## 2017-12-02 DIAGNOSIS — D649 Anemia, unspecified: Secondary | ICD-10-CM | POA: Diagnosis not present

## 2017-12-02 DIAGNOSIS — E1140 Type 2 diabetes mellitus with diabetic neuropathy, unspecified: Secondary | ICD-10-CM | POA: Diagnosis not present

## 2017-12-02 DIAGNOSIS — Z794 Long term (current) use of insulin: Secondary | ICD-10-CM | POA: Diagnosis not present

## 2017-12-02 NOTE — Progress Notes (Signed)
[name] was seen today in the movement disorders clinic for neurologic consultation at the request of [name], MD.  The consultation is for the evaluation of PD.   Pt is accompanied by her sister who supplements the history.   Patient has most recently been under the care of Dr. Hall Busing.  She last saw her about 1 year ago.  I have reviewed records made available to me.  Dr. Hall Busing discussed with her drug-induced parkinsonism due to Abilify versus Parkinson's disease.  A DaT scan was offered but declined.  Patient's symptoms were noted to be stable for 18 months.  Patient noted that mood was good on Abilify.  Pt was started on carbidopa/levodopa 25/100 in 2014 and has been on it ever since.  Has been on primidone for this as well but was d/c last march.  Pt does state that she stopped abilify 4 months ago because she heard it caused weight gain.   Of note, patient has a hx of both epileptic and nonepileptic seizure per records.  pts sister states that last event was before xmas and cannot say whether it was epileptic or nonepileptic event.  Sister states was staring and "trembling" for 5 min.  She will be sluggish for 30 min and then back to herself.  She doesn't drive  1/94/17 update: Patient is seen today in follow-up for parkinsonism.  Patient is seen accompanied by her sister who supplements the history.  I asked her to hold her levodopa and follow-up today.  She forgot and took it.   She was supposed to follow-up with my partner, Dr. Delice Lesch, regarding her history of epileptic and nonepileptic seizures.  Unfortunately, the patient ended up being unable to come to the appt as she was in the hospital.  She has not r/s.  As above, the patient was in the hospital since our last visit.  She was admitted on October 20, 2017 due to acute on chronic respiratory failure.  She ultimately went to subacute nursing facility for rehab. She is feeling better now.  PREVIOUS MEDICATIONS: Sinemet  ALLERGIES:     Allergies  Allergen Reactions  . Penicillins Hives    Has patient had a PCN reaction causing immediate rash, facial/tongue/throat swelling, SOB or lightheadedness with hypotension: Yes Has patient had a PCN reaction causing severe rash involving mucus membranes or skin necrosis: No Has patient had a PCN reaction that required hospitalization: No Has patient had a PCN reaction occurring within the last 10 years: No If all of the above answers are "NO", then may proceed with Cephalosporin use.   . Sulfa Antibiotics Hives  . Aspirin Other (See Comments)     On aspirin 81 mg - Rectal bleeding in dec 2018  . Codeine     Headache and makes the patient feel "off"  . Other Rash    Bleach    CURRENT MEDICATIONS:  Outpatient Encounter Medications as of 12/04/2017  Medication Sig  . acetaminophen (TYLENOL) 500 MG tablet Take 500 mg by mouth every 4 (four) hours as needed (for pain).   Marland Kitchen atorvastatin (LIPITOR) 10 MG tablet Take 1 tablet (10 mg total) by mouth daily.  . B Complex-C (B-COMPLEX WITH VITAMIN C) tablet Take 1 tablet by mouth daily.  . Blood Glucose Monitoring Suppl (ACCU-CHEK NANO SMARTVIEW) W/DEVICE KIT Use to check blood sugar 3 times per day dx code E11.65  . carbidopa-levodopa (SINEMET IR) 25-100 MG tablet Take 1 tablet by mouth 3 (three) times  daily.  . carvedilol (COREG) 12.5 MG tablet Take 1 tablet (12.5 mg total) by mouth 2 (two) times daily with a meal.  . chlorpheniramine-HYDROcodone (TUSSIONEX) 10-8 MG/5ML SUER Take 5 mLs by mouth every 12 (twelve) hours as needed for cough.  . Cholecalciferol (VITAMIN D-3) 1000 units CAPS Take 1,000 Units by mouth daily.  . cholestyramine (QUESTRAN) 4 g packet Take 1 packet (4 g total) by mouth 2 (two) times daily as needed (for diarrhea).  . diclofenac sodium (VOLTAREN) 1 % GEL Apply 2 g topically 4 (four) times daily.  . furosemide (LASIX) 80 MG tablet Take 0.5-1 tablets (40-80 mg total) by mouth daily. Take 80 mg M-W-F, take 40 mg all  other days  . Insulin Glargine (TOUJEO MAX SOLOSTAR) 300 UNIT/ML SOPN Inject 30 Units into the skin at bedtime.  . Insulin Pen Needle 31G X 8 MM MISC Use to inject medication 4 times daily  . ipratropium-albuterol (DUONEB) 0.5-2.5 (3) MG/3ML SOLN Take 3 mLs by nebulization every 4 (four) hours as needed.  . isosorbide mononitrate (IMDUR) 60 MG 24 hr tablet Take 1 tablet (60 mg total) by mouth daily.  Marland Kitchen lamoTRIgine (LAMICTAL) 100 MG tablet Take 1 tablet (100 mg total) by mouth 2 (two) times daily.  . Menthol, Topical Analgesic, 3.5 % GEL Apply 1 application topically as needed (for neuropathy in feet).  . metolazone (ZAROXOLYN) 2.5 MG tablet Take 1 tablet (2.5 mg total) by mouth See admin instructions. Take 1 tablet Monday and Friday only if weight is greater or equal to 269  . Multiple Vitamins-Minerals (MULTIVITAMIN WITH MINERALS) tablet Take 1 tablet by mouth daily.  . Pancrelipase, Lip-Prot-Amyl, 25000 UNITS CPEP Take 25,000-50,000 Units by mouth See admin instructions. 50,000 units with breakfast then 25,000 units with lunch then 50,000 units with dinner (evening meal)  . potassium chloride SA (K-DUR,KLOR-CON) 20 MEQ tablet Take 2 tablets (40 mEq total) by mouth daily. On the day you take metolazone, take an extra 20 meq (60 meq total)  . pregabalin (LYRICA) 100 MG capsule Take 1 capsule (100 mg total) by mouth 2 (two) times daily.  . Semaglutide (OZEMPIC) 0.25 or 0.5 MG/DOSE SOPN Inject 0.5 mg into the skin once a week.  . sertraline (ZOLOFT) 50 MG tablet Take 1 tablet (50 mg total) by mouth every evening.   No facility-administered encounter medications on file as of 12/04/2017.     PAST MEDICAL HISTORY:   Past Medical History:  Diagnosis Date  . Abnormal liver function     in the past.  . Anemia   . Arthritis    all over  . Bruises easily   . Cataract    right eye;immature  . CHF (congestive heart failure) ([place])   . Chronic back pain    stenosis  . Chronic cough   . Chronic  kidney disease   . COPD (chronic obstructive pulmonary disease) ([place])   . Demand myocardial infarction [facility]) 2012   Demand Infarction in setting of Pancreatitis --> mild Troponin elevation, Non-obstructive CAD  . Depression    takes Abilify daily as well as Zoloft  . Diastolic heart failure 6811   Grade 1 diastolic Dysfunction by Echo   . Diverticulosis   . DM (diabetes mellitus) ([place])    takes Victoza daily as well as Lantus and Humalog  . Dyspnea   . Empty sella ([place])    on MRI in 2009.  . Glaucoma   . Headache(784.0)    last migraine-4-32yr ago  .  History of blood transfusion    no abnormal reaction noted  . History of colon polyps    benign  . History of influenza    antibiotic given yesterday along with taking Delsym cough syrup  . HTN (hypertension)    takes [employer] daily  . Hyperlipidemia    takes Lipitor daily  . Joint swelling   . Nocturia   . Obstructive sleep apnea   . Pancreatitis    takes Pancrelipase daily  . Parkinson's disease ([place])    takes Sinemet daily  . Peripheral edema    takes Lasix daily  . Peripheral neuropathy   . Pneumonia 2012  . Seizures ([place])    takes Lamictal daily and Primidone nightly;last seizure 2wks ago  . Urinary frequency   . Urinary urgency   . Varicose veins of both lower extremities with pain     PAST SURGICAL HISTORY:   Past Surgical History:  Procedure Laterality Date  . ABDOMINAL HYSTERECTOMY    . APPENDECTOMY    . BACK SURGERY    . CARDIAC CATHETERIZATION  2009/March 2012   2009: (Dr. Alvie Heidelberg) Nonobstructive CAD; 2012: Minimal CAD --> false positive stress test  . Cardiac Event Monitor  September-October 2017   Sinus rhythm with occasional PACs and artifact. No arrhythmias besides one short run of tachycardia.  . CHOLECYSTECTOMY    . COLONOSCOPY N/A 08/15/2012   Procedure: COLONOSCOPY;  Surgeon: Arta Silence, MD;  Location: [facility];  Service: Endoscopy;  Laterality: N/A;  . COLONOSCOPY WITH  PROPOFOL Left 06/17/2017   Procedure: COLONOSCOPY WITH PROPOFOL;  Surgeon: [name], MD;  Location: [facility];  Service: Gastroenterology;  Laterality: Left;  . ESOPHAGOGASTRODUODENOSCOPY    . eye cysts Bilateral   . LASIK    . NM MYOVIEW LTD  01/2016   LOW RISK. No ischemia or infarction.  Marland Kitchen RECTAL POLYPECTOMY    . TONSILLECTOMY    . TRANSTHORACIC ECHOCARDIOGRAM  01/2016; 05/2018   A) Normal LV chamber size with mod LVH pattern. EF 50-55%. Severe LA dilation. Mod RA dilation. PAP elevated at 38 mmHg;; B) Mild concentric LVH.  EF 55-60% & no RWMA.  Grade 2 DD.  Diastolic flattening of the ventricular septum == ? elevated PAP.  Mild aortic valve calcification/sclerosis.  Mod LA dilation.  Mild RV dilation & Mod RA dilation  . TRANSTHORACIC ECHOCARDIOGRAM  10/2017   EF 55-60%.  No wall motion normality.  Moderate LA dilation.  Mild LA dilation.  Peak PA pressures estimated 57 mmHg (moderate)  . TUBAL LIGATION    . vaginal cyst removed     several times    SOCIAL HISTORY:   Social History   Socioeconomic History  . Marital status: Divorced    Spouse name: Not on file  . Number of children: Not on file  . Years of education: Not on file  . Highest education level: Not on file  Occupational History  . Occupation: Disabled    Comment: CNA  Social Needs  . Financial resource strain: Not on file  . Food insecurity:    Worry: Not on file    Inability: Not on file  . Transportation needs:    Medical: Not on file    Non-medical: Not on file  Tobacco Use  . Smoking status: Former Smoker    Packs/day: 0.50    Years: 25.00    Pack years: 12.50    Types: Cigarettes  . Smokeless tobacco: Never Used  . Tobacco comment: quit smoking  20+ytrs ago  Substance and Sexual Activity  . Alcohol use: No  . Drug use: No  . Sexual activity: Never    Birth control/protection: Surgical  Lifestyle  . Physical activity:    Days per week: Not on file    Minutes per session: Not on file  .  Stress: Not on file  Relationships  . Social connections:    Talks on phone: Not on file    Gets together: Not on file    Attends religious service: Not on file    Active member of club or organization: Not on file    Attends meetings of clubs or organizations: Not on file    Relationship status: Not on file  . Intimate partner violence:    Fear of current or ex partner: Not on file    Emotionally abused: Not on file    Physically abused: Not on file    Forced sexual activity: Not on file  Other Topics Concern  . Not on file  Social History Narrative   She lives in [place]. She has lots of family in the area and is accompanied by her sister.   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   She has a distant history of smoking, quit 20 years ago.    FAMILY HISTORY:   Family Status  Relation Name Status  . Father  Deceased  . Mother  Deceased  . Sister  Alive  . Brother  Alive  . Sister  Deceased  . Son [employer]  . Son [employer]  . Other  (Not Specified)    ROS:  Review of Systems  Eyes: Negative.   Respiratory: Positive for shortness of breath (intermittent, chronic).   Cardiovascular: Negative.   Gastrointestinal: Negative.   Genitourinary: Negative.   Musculoskeletal: Positive for joint pain (ankles/feet.  working on getting shoes to accomodate the inversion of the ankles).  Neurological: Positive for seizures (she isn't sure if "real" seizures; consist of staring).  Endo/Heme/Allergies: Negative.      PHYSICAL EXAMINATION:    VITALS:   Vitals:   12/04/17 1126  BP: 136/72  Pulse: (!) 56  SpO2: (!) 89%  Weight: 267 lb (121.1 kg)  Height: 5' 1.5" (1.562 m)    GEN:  The patient appears stated age and is in NAD. HEENT:  Normocephalic, atraumatic.  The mucous membranes are moist. The superficial temporal arteries are without ropiness or tenderness. CV:  RRR Lungs:  CTAB Neck/HEME:  There are no carotid bruits  bilaterally.  Neurological examination:  Orientation: The patient is alert and oriented x3. Cranial nerves: There is good facial symmetry. Pupils are equal round and reactive to light bilaterally. Fundoscopic exam is attempted but the disc margins are not well visualized bilaterally.  Extraocular muscles are intact. The visual fields are full to confrontational testing. The speech is fluent and clear. Soft palate rises symmetrically and there is no tongue deviation. Hearing is intact to conversational tone. Sensation: Sensation is intact to light touch throughout. Motor: Strength is 5/5 in the RUE/RLE.  There is decreased grasp on the left.  Strength in the left upper/left lower extremity is at least 5-/5 but there is give way weakness that does improve fairly significantly with encouragement (similar to last visit).   Movement examination: Tone: There is normal tone in the upper and lower extremities. Abnormal movements: There is no tremor.  There is no dyskinesia. Coordination:  There is no decremation with RAM's,  with any form of RAMS, including alternating supination and pronation of the forearm, hand opening and closing, finger taps, heel taps and toe taps. Gait and Station: The patient pushes off of the chair to arise.  She uses her cane.  Her ankles are inverted when she ambulates.  Gait is wide-based.  She does not shuffle.     ASSESSMENT/PLAN:  1.  Parkinsonism, by history  -I had a long counseling session with the patient today.  I discussed with the patient that she likely had secondary parkinsonism due to Abilify.  I explained what that meant.  She does not look parkinsonian at all today and has been off Abilify for 8 months.  I did tell her it is possible (not probable) that the medication is just covering up symptoms.  Ultimately, I decided to go ahead and wean her off of levodopa over the next few weeks.  She will let me know if she has any issues.  2. Epileptic and nonepileptic  seizure  -Per [facility] records, patient has a history of both epileptic and PNES.  She is on Lamictal, 100 mg twice per day.  I explained that this is somewhat out of my area of expertise.  When at [facility], she was seeing epileptology and movement d/o.  She had an appt with Dr. Delice Lesch but was apparently in the hospital and didn't make the appt. I will assist in getting her rescheduled.  3.  F/u prn  Cc:  [name], MD

## 2017-12-03 ENCOUNTER — Other Ambulatory Visit: Payer: Self-pay | Admitting: *Deleted

## 2017-12-03 DIAGNOSIS — D649 Anemia, unspecified: Secondary | ICD-10-CM | POA: Diagnosis not present

## 2017-12-03 DIAGNOSIS — I130 Hypertensive heart and chronic kidney disease with heart failure and stage 1 through stage 4 chronic kidney disease, or unspecified chronic kidney disease: Secondary | ICD-10-CM | POA: Diagnosis not present

## 2017-12-03 DIAGNOSIS — I2510 Atherosclerotic heart disease of native coronary artery without angina pectoris: Secondary | ICD-10-CM | POA: Diagnosis not present

## 2017-12-03 DIAGNOSIS — J449 Chronic obstructive pulmonary disease, unspecified: Secondary | ICD-10-CM | POA: Diagnosis not present

## 2017-12-03 DIAGNOSIS — Z794 Long term (current) use of insulin: Secondary | ICD-10-CM | POA: Diagnosis not present

## 2017-12-03 DIAGNOSIS — N189 Chronic kidney disease, unspecified: Secondary | ICD-10-CM | POA: Diagnosis not present

## 2017-12-03 DIAGNOSIS — E1140 Type 2 diabetes mellitus with diabetic neuropathy, unspecified: Secondary | ICD-10-CM | POA: Diagnosis not present

## 2017-12-03 DIAGNOSIS — G20 Parkinson's disease: Secondary | ICD-10-CM | POA: Diagnosis not present

## 2017-12-03 DIAGNOSIS — I5032 Chronic diastolic (congestive) heart failure: Secondary | ICD-10-CM | POA: Diagnosis not present

## 2017-12-03 NOTE — Patient Outreach (Signed)
[facility]) Care Management  12/03/2017  [name] 12-30-1945 [id number]   Weekly transition of care call placed to member's sister (per her request), no answer.  HIPAA compliant voice message left.  Will await call back.  Outreach letter sent, will follow up within the next 4 business days.  Valente David, [place], MSN [place] [phone]

## 2017-12-04 ENCOUNTER — Other Ambulatory Visit: Payer: Self-pay

## 2017-12-04 ENCOUNTER — Ambulatory Visit: Payer: Medicare HMO | Admitting: Neurology

## 2017-12-04 ENCOUNTER — Encounter: Payer: Self-pay | Admitting: Neurology

## 2017-12-04 VITALS — BP 136/72 | HR 56 | Ht 61.5 in | Wt 267.0 lb

## 2017-12-04 DIAGNOSIS — G2111 Neuroleptic induced parkinsonism: Secondary | ICD-10-CM

## 2017-12-04 NOTE — Patient Instructions (Signed)
Decrease carbidopa/levodopa 25/100 to twice per day for a week and then decrease carbidopa/levodopa q day for a week and then stop the medication

## 2017-12-05 DIAGNOSIS — N189 Chronic kidney disease, unspecified: Secondary | ICD-10-CM | POA: Diagnosis not present

## 2017-12-05 DIAGNOSIS — E1140 Type 2 diabetes mellitus with diabetic neuropathy, unspecified: Secondary | ICD-10-CM | POA: Diagnosis not present

## 2017-12-05 DIAGNOSIS — D649 Anemia, unspecified: Secondary | ICD-10-CM | POA: Diagnosis not present

## 2017-12-05 DIAGNOSIS — I2510 Atherosclerotic heart disease of native coronary artery without angina pectoris: Secondary | ICD-10-CM | POA: Diagnosis not present

## 2017-12-05 DIAGNOSIS — I5032 Chronic diastolic (congestive) heart failure: Secondary | ICD-10-CM | POA: Diagnosis not present

## 2017-12-05 DIAGNOSIS — G20 Parkinson's disease: Secondary | ICD-10-CM | POA: Diagnosis not present

## 2017-12-05 DIAGNOSIS — J449 Chronic obstructive pulmonary disease, unspecified: Secondary | ICD-10-CM | POA: Diagnosis not present

## 2017-12-05 DIAGNOSIS — I130 Hypertensive heart and chronic kidney disease with heart failure and stage 1 through stage 4 chronic kidney disease, or unspecified chronic kidney disease: Secondary | ICD-10-CM | POA: Diagnosis not present

## 2017-12-05 DIAGNOSIS — Z794 Long term (current) use of insulin: Secondary | ICD-10-CM | POA: Diagnosis not present

## 2017-12-07 DIAGNOSIS — G4733 Obstructive sleep apnea (adult) (pediatric): Secondary | ICD-10-CM | POA: Diagnosis not present

## 2017-12-07 DIAGNOSIS — I10 Essential (primary) hypertension: Secondary | ICD-10-CM | POA: Diagnosis not present

## 2017-12-07 DIAGNOSIS — M48061 Spinal stenosis, lumbar region without neurogenic claudication: Secondary | ICD-10-CM | POA: Diagnosis not present

## 2017-12-07 DIAGNOSIS — I5030 Unspecified diastolic (congestive) heart failure: Secondary | ICD-10-CM | POA: Diagnosis not present

## 2017-12-07 DIAGNOSIS — R0602 Shortness of breath: Secondary | ICD-10-CM | POA: Diagnosis not present

## 2017-12-07 DIAGNOSIS — J9621 Acute and chronic respiratory failure with hypoxia: Secondary | ICD-10-CM | POA: Diagnosis not present

## 2017-12-07 DIAGNOSIS — I509 Heart failure, unspecified: Secondary | ICD-10-CM | POA: Diagnosis not present

## 2017-12-07 DIAGNOSIS — G4089 Other seizures: Secondary | ICD-10-CM | POA: Diagnosis not present

## 2017-12-07 DIAGNOSIS — I5032 Chronic diastolic (congestive) heart failure: Secondary | ICD-10-CM | POA: Diagnosis not present

## 2017-12-09 ENCOUNTER — Other Ambulatory Visit: Payer: Self-pay

## 2017-12-09 ENCOUNTER — Other Ambulatory Visit: Payer: Self-pay | Admitting: *Deleted

## 2017-12-09 ENCOUNTER — Ambulatory Visit: Payer: Medicare HMO | Admitting: Neurology

## 2017-12-09 ENCOUNTER — Encounter: Payer: Self-pay | Admitting: Neurology

## 2017-12-09 VITALS — BP 156/80 | HR 61 | Ht 61.0 in | Wt 261.0 lb

## 2017-12-09 DIAGNOSIS — G40009 Localization-related (focal) (partial) idiopathic epilepsy and epileptic syndromes with seizures of localized onset, not intractable, without status epilepticus: Secondary | ICD-10-CM | POA: Diagnosis not present

## 2017-12-09 DIAGNOSIS — F445 Conversion disorder with seizures or convulsions: Secondary | ICD-10-CM | POA: Diagnosis not present

## 2017-12-09 MED ORDER — LAMOTRIGINE 100 MG PO TABS: 100.0000 mg | ORAL_TABLET | Freq: Two times a day (BID) | ORAL | 3 refills | Status: DC

## 2017-12-09 NOTE — Progress Notes (Signed)
NEUROLOGY CONSULTATION NOTE  BRADLEE BRIDGERS MRN: [id number] DOB: 1945-07-21  Referring provider: Dr. Wells Guiles Tat Primary care provider: Dr. [name]  Reason for consult:  Establish care for seizures  Dear Dr Tat:  Thank you for your kind referral of CAROLYNN TULEY for consultation of the above symptoms. Although her history is well known to you, please allow me to reiterate it for the purpose of our medical record. The patient was accompanied to the clinic by her sister who also provides collateral information. Records and images were personally reviewed where available.  HISTORY OF PRESENT ILLNESS:  This is a 72 year old right-handed woman with a history of hypertension, hyperlipidemia, diabetes, sleep apnea, drug-induced parkinsonism, presenting to establish care for seizures. She had previously been seeing the Epilepsy and Movement Disorders clinic at [facility]. She has seen Dr. Carles Collet in our office and Sinemet is being weaned off. Her last visit with the Epilepsy clinic was in 2017. Records were reviewed and will be summarized for this visit. She reports the first seizure occurred in March 2009. She was driving at that time and lost consciousness without any prior warning symptoms. Family witnessed episodes of staring off and whole body jerking with foaming at the mouth. Initially she was having episodes every 3 months, then they began to cluster having up to 5 events at a time. She was monitored at [facility] in January 2011 where 3 events were captured. One epileptic event localized to the left anterior temporal region. She had 2 additional events with abnormal facial and hand movements without clear EEG changes seen. She was started on Lamotrigine at that time. She had a 3-day ambulatory EEG that was technically limited, with note of a single left temporal spike and multiple push button events with no definite EEG correlate, although EEG was obscured by artifact during some of the  events. Her sister has described her going into a trance with abnormal mouth movements (mouth moving quickly), followed by right arm shaking lasting up to 5 minutes, at times with associated urinary incontinence. The last episode was in December 2018. She would feel lost and dizzy after, no focal weakness. One time she told her sister there was a funny taste in her mouth after a seizure. She is taking Lamotrigine 124m BID without side effects. She is also on Lyrica 1089mBID for back pain, no side effects.   She reports she feels fine other than she can't get around well due to her feet issues. She has occasional sharp headaches on the left temporal region, she had one the other day lasting a few minutes with no associated nausea/vomiting. She has rare dizziness, no diplopia, dysarthria/dysphagia, neck pain, focal numbness/tingling, bladder dysfunction. She has chronic back pain and pain in both legs, she has had constipation due to iron supplements. She lives with her sister and does not drive.   Epilepsy Risk Factors:  Her sister thinks her mother had seizures due to episodes of right arm shaking/tremors, but as far as she knows was not on seizure medication. Otherwise she had a normal birth and early development.  There is no history of febrile convulsions, CNS infections such as meningitis/encephalitis, significant traumatic brain injury, neurosurgical procedures.  Laboratory Data: Bloodwork from 08/09/15: Lamotrigine level 3.5 EEGs:as above MRI: I personally reviewed MRI brain done 05/2017 which did not show any acute changes. 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  PAST MEDICAL HISTORY: Past Medical History:  Diagnosis Date  . Abnormal liver function     in the past.  . Anemia   . Arthritis    all over  . Bruises easily   . Cataract    right  eye;immature  . CHF (congestive heart failure) ([place])   . Chronic back pain    stenosis  . Chronic cough   . Chronic kidney disease   . COPD (chronic obstructive pulmonary disease) ([place])   . Demand myocardial infarction [facility]) 2012   [place] Infarction in setting of Pancreatitis --> mild Troponin elevation, Non-obstructive CAD  . Depression    takes Abilify daily as well as Zoloft  . Diastolic heart failure 2229   Grade 1 diastolic Dysfunction by Echo   . Diverticulosis   . DM (diabetes mellitus) ([place])    takes Victoza daily as well as Lantus and Humalog  . Dyspnea   . Empty sella ([place])    on MRI in 2009.  . Glaucoma   . Headache(784.0)    last migraine-4-7yr ago  . History of blood transfusion    no abnormal reaction noted  . History of colon polyps    benign  . History of influenza    antibiotic given yesterday along with taking Delsym cough syrup  . HTN (hypertension)    takes BKinder Morgan Energydaily  . Hyperlipidemia    takes Lipitor daily  . Joint swelling   . Nocturia   . Obstructive sleep apnea   . Pancreatitis    takes Pancrelipase daily  . Parkinson's disease ([place]    takes Sinemet daily  . Peripheral edema    takes Lasix daily  . Peripheral neuropathy   . Pneumonia 2012  . Seizures ([place]    takes Lamictal daily and Primidone nightly;last seizure 2wks ago  . Urinary frequency   . Urinary urgency   . Varicose veins of both lower extremities with pain     PAST SURGICAL HISTORY: Past Surgical History:  Procedure Laterality Date  . ABDOMINAL HYSTERECTOMY    . APPENDECTOMY    . BACK SURGERY    . CARDIAC CATHETERIZATION  2009/March 2012   2009: (Dr. KAlvie Heidelberg Nonobstructive CAD; 2012: Minimal CAD --> false positive stress test  . Cardiac Event Monitor  September-October 2017   Sinus rhythm with occasional PACs and artifact. No arrhythmias besides one short run of tachycardia.  . CHOLECYSTECTOMY    . COLONOSCOPY N/A 08/15/2012   Procedure:  COLONOSCOPY;  Surgeon: WArta Silence MD;  Location: [facility];  Service: Endoscopy;  Laterality: N/A;  . COLONOSCOPY WITH PROPOFOL Left 06/17/2017   Procedure: COLONOSCOPY WITH PROPOFOL;  Surgeon: KRonnette Juniper MD;  [place]  Service: Gastroenterology;  Laterality: Left;  . ESOPHAGOGASTRODUODENOSCOPY    . eye cysts Bilateral   . LASIK    . NM MYOVIEW LTD  01/2016   LOW RISK. No ischemia or infarction.  .Marland KitchenRECTAL POLYPECTOMY    . TONSILLECTOMY    . TRANSTHORACIC ECHOCARDIOGRAM  01/2016; 05/2018   A) Normal LV chamber size with mod LVH pattern. EF 50-55%. Severe LA dilation. Mod RA dilation. PAP elevated at 38 mmHg;; B) Mild concentric LVH.  EF 55-60% & no RWMA.  Grade 2 DD.  Diastolic flattening of the ventricular septum == ? elevated PAP.  Mild aortic valve calcification/sclerosis.  Mod LA dilation.  Mild RV dilation & Mod RA dilation  . TRANSTHORACIC ECHOCARDIOGRAM  10/2017  EF 55-60%.  No wall motion normality.  Moderate LA dilation.  Mild LA dilation.  Peak PA pressures estimated 57 mmHg (moderate)  . TUBAL LIGATION    . vaginal cyst removed     several times    MEDICATIONS: Current Outpatient Medications on File Prior to Visit  Medication Sig Dispense Refill  . acetaminophen (TYLENOL) 500 MG tablet Take 500 mg by mouth every 4 (four) hours as needed (for pain).     Marland Kitchen atorvastatin (LIPITOR) 10 MG tablet Take 1 tablet (10 mg total) by mouth daily. 30 tablet 0  . B Complex-C (B-COMPLEX WITH VITAMIN C) tablet Take 1 tablet by mouth daily.    . Blood Glucose Monitoring Suppl (ACCU-CHEK NANO SMARTVIEW) W/DEVICE KIT Use to check blood sugar 3 times per day dx code E11.65 1 kit 0  . carbidopa-levodopa (SINEMET IR) 25-100 MG tablet Take 1 tablet by mouth 3 (three) times daily. 90 tablet 0  . carvedilol (COREG) 12.5 MG tablet Take 1 tablet (12.5 mg total) by mouth 2 (two) times daily with a meal. 60 tablet 0  . chlorpheniramine-HYDROcodone (TUSSIONEX) 10-8 MG/5ML SUER Take 5  mLs by mouth every 12 (twelve) hours as needed for cough. 140 mL 0  . Cholecalciferol (VITAMIN D-3) 1000 units CAPS Take 1,000 Units by mouth daily.    . cholestyramine (QUESTRAN) 4 g packet Take 1 packet (4 g total) by mouth 2 (two) times daily as needed (for diarrhea). 60 each 0  . diclofenac sodium (VOLTAREN) 1 % GEL Apply 2 g topically 4 (four) times daily. 100 g 0  . furosemide (LASIX) 80 MG tablet Take 0.5-1 tablets (40-80 mg total) by mouth daily. Take 80 mg M-W-F, take 40 mg all other days 30 tablet 0  . Insulin Glargine (TOUJEO MAX SOLOSTAR) 300 UNIT/ML SOPN Inject 30 Units into the skin at bedtime. 3 mL 0  . Insulin Pen Needle 31G X 8 MM MISC Use to inject medication 4 times daily 400 each 1  . ipratropium-albuterol (DUONEB) 0.5-2.5 (3) MG/3ML SOLN Take 3 mLs by nebulization every 4 (four) hours as needed. 360 mL 0  . isosorbide mononitrate (IMDUR) 60 MG 24 hr tablet Take 1 tablet (60 mg total) by mouth daily. 90 tablet 3  . lamoTRIgine (LAMICTAL) 100 MG tablet Take 1 tablet (100 mg total) by mouth 2 (two) times daily. 60 tablet 0  . Menthol, Topical Analgesic, 3.5 % GEL Apply 1 application topically as needed (for neuropathy in feet). 120 mL 0  . metolazone (ZAROXOLYN) 2.5 MG tablet Take 1 tablet (2.5 mg total) by mouth See admin instructions. Take 1 tablet Monday and Friday only if weight is greater or equal to 269 10 tablet 0  . Multiple Vitamins-Minerals (MULTIVITAMIN WITH MINERALS) tablet Take 1 tablet by mouth daily.    . Pancrelipase, Lip-Prot-Amyl, 25000 UNITS CPEP Take 25,000-50,000 Units by mouth See admin instructions. 50,000 units with breakfast then 25,000 units with lunch then 50,000 units with dinner (evening meal)    . potassium chloride SA (K-DUR,KLOR-CON) 20 MEQ tablet Take 2 tablets (40 mEq total) by mouth daily. On the day you take metolazone, take an extra 20 meq (60 meq total) 60 tablet 0  . pregabalin (LYRICA) 100 MG capsule Take 1 capsule (100 mg total) by mouth 2  (two) times daily. 60 capsule 0  . Semaglutide (OZEMPIC) 0.25 or 0.5 MG/DOSE SOPN Inject 0.5 mg into the skin once a week.    . sertraline (ZOLOFT) 50 MG tablet Take  1 tablet (50 mg total) by mouth every evening. 30 tablet 0   No current facility-administered medications on file prior to visit.     ALLERGIES: Allergies  Allergen Reactions  . Penicillins Hives    Has patient had a PCN reaction causing immediate rash, facial/tongue/throat swelling, SOB or lightheadedness with hypotension: Yes Has patient had a PCN reaction causing severe rash involving mucus membranes or skin necrosis: No Has patient had a PCN reaction that required hospitalization: No Has patient had a PCN reaction occurring within the last 10 years: No If all of the above answers are "NO", then may proceed with Cephalosporin use.   . Sulfa Antibiotics Hives  . Aspirin Other (See Comments)     On aspirin 81 mg - Rectal bleeding in dec 2018  . Codeine     Headache and makes the patient feel "off"  . Other Rash    Bleach    FAMILY HISTORY: Family History  Problem Relation Age of Onset  . Allergies Father   . Heart disease Father        before 46  . Heart failure Father   . Hypertension Father   . Hyperlipidemia Father   . Heart disease Mother   . Diabetes Mother   . Hypertension Mother   . Hyperlipidemia Mother   . Hypertension Sister   . Heart disease Sister        before 34  . Hyperlipidemia Sister   . Diabetes Brother   . Hypertension Brother   . Diabetes Sister   . Hypertension Sister   . Diabetes Son   . Hypertension Son   . Cancer Other     SOCIAL HISTORY: Social History   Socioeconomic History  . Marital status: Divorced    Spouse name: Not on file  . Number of children: Not on file  . Years of education: Not on file  . Highest education level: Not on file  Occupational History  . Occupation: Disabled    Comment: CNA  Social Needs  . Financial resource strain: Not on file  . Food  insecurity:    Worry: Not on file    Inability: Not on file  . Transportation needs:    Medical: Not on file    Non-medical: Not on file  Tobacco Use  . Smoking status: Former Smoker    Packs/day: 0.50    Years: 25.00    Pack years: 12.50    Types: Cigarettes  . Smokeless tobacco: Never Used  . Tobacco comment: quit smoking 20+ytrs ago  Substance and Sexual Activity  . Alcohol use: No  . Drug use: No  . Sexual activity: Never    Birth control/protection: Surgical  Lifestyle  . Physical activity:    Days per week: Not on file    Minutes per session: Not on file  . Stress: Not on file  Relationships  . Social connections:    Talks on phone: Not on file    Gets together: Not on file    Attends religious service: Not on file    Active member of club or organization: Not on file    Attends meetings of clubs or organizations: Not on file    Relationship status: Not on file  . Intimate partner violence:    Fear of current or ex partner: Not on file    Emotionally abused: Not on file    Physically abused: Not on file    Forced sexual activity: Not on file  Other Topics Concern  . Not on file  Social History Narrative   She lives in [place]. She has lots of family in the area and is accompanied by her sister.   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   She has a distant history of smoking, quit 20 years ago.    REVIEW OF SYSTEMS: Constitutional: No fevers, chills, or sweats, no generalized fatigue, change in appetite Eyes: No visual changes, double vision, eye pain Ear, nose and throat: No hearing loss, ear pain, nasal congestion, sore throat Cardiovascular: No chest pain, palpitations Respiratory:  No shortness of breath at rest or with exertion, wheezes GastrointestinaI: No nausea, vomiting, diarrhea, abdominal pain, fecal incontinence Genitourinary:  No dysuria, urinary retention or frequency Musculoskeletal:  No neck pain, +back  pain Integumentary: No rash, pruritus, skin lesions Neurological: as above Psychiatric: No depression, insomnia, anxiety Endocrine: No palpitations, fatigue, diaphoresis, mood swings, change in appetite, change in weight, increased thirst Hematologic/Lymphatic:  No anemia, purpura, petechiae. Allergic/Immunologic: no itchy/runny eyes, nasal congestion, recent allergic reactions, rashes  PHYSICAL EXAM: Vitals:   12/09/17 1046  BP: (!) 156/80  Pulse: 61  SpO2: 91%   General: No acute distress Head:  Normocephalic/atraumatic Eyes: Fundoscopic exam shows bilateral sharp discs, no vessel changes, exudates, or hemorrhages Neck: supple, no paraspinal tenderness, full range of motion Back: No paraspinal tenderness Heart: regular rate and rhythm Lungs: Clear to auscultation bilaterally. Vascular: No carotid bruits. Skin/Extremities: No rash, no edema, both feet wrapped in elastic bandage Neurological Exam: Mental status: alert and oriented to person, place, and time, no dysarthria or aphasia, Fund of knowledge is appropriate.  Recent and remote memory are intact.  Attention and concentration are normal.    Able to name objects and repeat phrases. Cranial nerves: CN I: not tested CN II: pupils equal, round and reactive to light, visual fields intact, fundi unremarkable. CN III, IV, VI:  full range of motion, no nystagmus, no ptosis CN V: facial sensation intact CN VII: upper and lower face symmetric CN VIII: hearing intact to finger rub CN IX, X: gag intact, uvula midline CN XI: sternocleidomastoid and trapezius muscles intact CN XII: tongue midline Bulk & Tone: normal, no fasciculations. Motor: 5/5 throughout with no pronator drift. Sensation: decreased cold on left UE and LE, intact pin and vibration sense. Romberg test negative Deep Tendon Reflexes: +2 on both UE, unable to elicit on both LE Plantar responses: downgoing bilaterally Cerebellar: no incoordination on finger to nose  testing Gait: slow and cautious with both feet inverted, wide-based, no ataxia Tremor: none in the office today  IMPRESSION: This is a 72 year old right-handed woman with a history of hypertension, hyperlipidemia, diabetes, sleep apnea, drug-induced parkinsonism, presenting to establish care for seizures. She has had evaluation at [facility] with video EEG capturing one epileptic seizure arising from the left anterior temporal region. She has also had several episodes captured on inpatient and ambulatory EEG with no EEG correlate. 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 We discussed the diagnosis, she does not seem to be aware of the different seizure types. She has not had any events since December 2018, continue Lamotrigine 132m BID. She is also on Lyrica 1075mBID for pain. She does not drive. She has been instructed by Dr. TaCarles Colleto wean off Sinemet, continue with medications as instructed. She will follow-up in 6 months and knows to call for any changes.   Thank you  for allowing me to participate in the care of this patient. Please do not hesitate to call for any questions or concerns.   Ellouise Newer, M.D.  CC: Dr. Carles Collet, Dr. Baird Cancer

## 2017-12-09 NOTE — Patient Instructions (Addendum)
1. Continue Lamictal 100mg  twice a day 2. Continue all your medications as instructed 3. Follow-up in 6 months, call for any changes  Seizure Precautions: 1. If medication has been prescribed for you to prevent seizures, take it exactly as directed.  Do not stop taking the medicine without talking to your doctor first, even if you have not had a seizure in a long time.   2. Avoid activities in which a seizure would cause danger to yourself or to others.  Don't operate dangerous machinery, swim alone, or climb in high or dangerous places, such as on ladders, roofs, or girders.  Do not drive unless your doctor says you may.  3. If you have any warning that you may have a seizure, lay down in a safe place where you can't hurt yourself.    4.  No driving for 6 months from last seizure, as per [facility].   Please refer to the following link on the [place] website for more information: [email or web address]/drivingu.cfm   5.  Maintain good sleep hygiene. Avoid alcohol.  6.  Contact your doctor if you have any problems that may be related to the medicine you are taking.  7.  Call 911 and bring the patient back to the ED if:        A.  The seizure lasts longer than 5 minutes.       B.  The patient doesn't awaken shortly after the seizure  C.  The patient has new problems such as difficulty seeing, speaking or moving  D.  The patient was injured during the seizure  E.  The patient has a temperature over 102 F (39C)  F.  The patient vomited and now is having trouble breathing

## 2017-12-09 NOTE — Patient Outreach (Signed)
[place] [facility]) Care Management  12/09/2017  [name] [birth year] [id number]   Weekly transition of care call placed to member's sister. No answer, HIPAA compliant voice message left.  Will follow up within the next 4 business days.  Valente David, [place], MSN [place] [phone]

## 2017-12-13 ENCOUNTER — Other Ambulatory Visit: Payer: Self-pay | Admitting: *Deleted

## 2017-12-13 NOTE — Patient Outreach (Signed)
[place] [facility]) Care Management  12/13/2017  SYDNEE LAMOUR [birth year] [id number]   Weekly transition of care call placed to member's sister (per her request) to follow up on current health condition.  She report member has been doing her daily weights, yesterday was 258 pounds, today was 260 pounds.  Denies shortness of breath or chest discomfort.  Sister continues to provide low sugar/low salt diet.  Denies any urgent concerns at this time, agrees to home visit within the next 3 weeks, will have covering care manager contact sister to complete transition of care.  [facility] CM Care Plan Problem One     Most Recent Value  Care Plan Problem One  Risk for readmission related to heart failure as evidenced by recent hospitalization  Role Documenting the Problem One  Care Management [place] for Problem One  Active  [facility] Long Term Goal   Member will not be readmitted to hospital within the next 31 days  [facility] Long Term Goal Start Date  11/14/17  Inter[facility] for Problem One Long Term Goal  Sister educated on importance of adhering to plan of care for heart failure management in effort to decrease risk of readmission  [facility] CM Short Term Goal #1   Member will report compliance with taking and recording daily weights over the next 4 weeks  [facility] CM Short Term Goal #1 Start Date  11/14/17  [facility] CM Short Term Goal #1 Met Date  12/13/17  [facility] CM Short Term Goal #2   Member will keep and attend follow up with cardiologist within the next 2 weeks  [facility] CM Short Term Goal #2 Start Date  12/13/17  Interventions for Short Term Goal #2  Reminded of upcoming appointment with cardiologist.  Confirmed sister will provide transportation     Valente David, Therapist, sports, MSN [place] [phone]

## 2017-12-14 ENCOUNTER — Other Ambulatory Visit: Payer: Self-pay | Admitting: Adult Health

## 2017-12-14 DIAGNOSIS — G20 Parkinson's disease: Secondary | ICD-10-CM

## 2017-12-18 DIAGNOSIS — M25571 Pain in right ankle and joints of right foot: Secondary | ICD-10-CM | POA: Diagnosis not present

## 2017-12-18 DIAGNOSIS — M65872 Other synovitis and tenosynovitis, left ankle and foot: Secondary | ICD-10-CM | POA: Diagnosis not present

## 2017-12-18 DIAGNOSIS — M65871 Other synovitis and tenosynovitis, right ankle and foot: Secondary | ICD-10-CM | POA: Diagnosis not present

## 2017-12-18 DIAGNOSIS — M25572 Pain in left ankle and joints of left foot: Secondary | ICD-10-CM | POA: Diagnosis not present

## 2017-12-20 ENCOUNTER — Other Ambulatory Visit: Payer: Self-pay

## 2017-12-20 NOTE — Patient Outreach (Signed)
[place] [facility]) Care Management  12/20/2017  SHALLA BULLUCK [birth year] [id number]   Transition of care: RNCM called to follow up. RNCM called client's home number. No answer. HIPPA compliant message left.  Per assigned nurse, client's preference is to call her sister. RNCM called client's sister [name]. Ms. Rockwell Germany reports that Ms. Trawick was having a pain in her feet yesterday due flat feet and neuropathy. She reports Ms. Babineaux went to her doctor and received injections. She states today client is better.   Ms. Rockwell Germany states that client continues to weigh every day. Her weight yesterday was 267 and yesterday she was 265. Denies any other issues or concerns.  Ms. Rockwell Germany encouraged to call RNCM with questions or concerns as needed.  Plan: update assigned RNCM, home visit previously scheduled for next month.  Covering RNCM: Thea Silversmith, RN, MSN, [place] Coordinator Cell: [phone]

## 2017-12-30 ENCOUNTER — Ambulatory Visit: Payer: Medicare HMO | Admitting: Physician Assistant

## 2018-01-01 ENCOUNTER — Other Ambulatory Visit: Payer: Self-pay | Admitting: *Deleted

## 2018-01-01 NOTE — Patient Outreach (Signed)
[facility]) Care Management   01/01/2018  [name] 03-18-1946 [id number]  [name] is an 72 y.o. female  Subjective:   Member alert and oriented x3, complains of chronic pain in feet/ankles, denies pain otherwise.  Denies shortness of breath.  She has continued to monitor blood sugar and weights daily, weight has steadily declined.  Next visit with Dr. Baird Cancer 7/22, was scheduled for visit with cardiologist 7/8, rescheduled for 7/29.  Objective:   Review of Systems  Constitutional: Negative.   HENT: Negative.   Eyes: Negative.   Respiratory: Negative.   Cardiovascular: Negative.   Gastrointestinal: Negative.   Genitourinary: Negative.   Musculoskeletal: Negative.   Skin: Negative.   Neurological: Negative.   Endo/Heme/Allergies: Negative.   Psychiatric/Behavioral: Negative.     Physical Exam  Constitutional: She is oriented to person, place, and time. She appears well-developed and well-nourished.  Neck: Normal range of motion.  Cardiovascular: Normal rate, regular rhythm and normal heart sounds.  Respiratory: Effort normal and breath sounds normal.  GI: Soft. Bowel sounds are normal.  Musculoskeletal: Normal range of motion.  Neurological: She is alert and oriented to person, place, and time.  Skin: Skin is warm and dry.   BP 118/64 (BP Location: Right Arm, Patient Position: Sitting, Cuff Size: Large)   Pulse 61   Resp 18   Wt 256 lb (116.1 kg)   SpO2 97%   BMI 48.37 kg/m   Encounter Medications:   Outpatient Encounter Medications as of 01/01/2018  Medication Sig  . acetaminophen (TYLENOL) 500 MG tablet Take 500 mg by mouth every 4 (four) hours as needed (for pain).   Marland Kitchen atorvastatin (LIPITOR) 10 MG tablet Take 1 tablet (10 mg total) by mouth daily.  . B Complex-C (B-COMPLEX WITH VITAMIN C) tablet Take 1 tablet by mouth daily.  . Blood Glucose Monitoring Suppl (ACCU-CHEK NANO SMARTVIEW) W/DEVICE KIT Use to check blood sugar 3 times  per day dx code E11.65  . carbidopa-levodopa (SINEMET IR) 25-100 MG tablet Take 1 tablet by mouth 3 (three) times daily.  . carvedilol (COREG) 12.5 MG tablet Take 1 tablet (12.5 mg total) by mouth 2 (two) times daily with a meal.  . chlorpheniramine-HYDROcodone (TUSSIONEX) 10-8 MG/5ML SUER Take 5 mLs by mouth every 12 (twelve) hours as needed for cough.  . Cholecalciferol (VITAMIN D-3) 1000 units CAPS Take 1,000 Units by mouth daily.  . cholestyramine (QUESTRAN) 4 g packet Take 1 packet (4 g total) by mouth 2 (two) times daily as needed (for diarrhea).  . diclofenac sodium (VOLTAREN) 1 % GEL Apply 2 g topically 4 (four) times daily.  . furosemide (LASIX) 80 MG tablet Take 0.5-1 tablets (40-80 mg total) by mouth daily. Take 80 mg M-W-F, take 40 mg all other days  . Insulin Glargine (TOUJEO MAX SOLOSTAR) 300 UNIT/ML SOPN Inject 30 Units into the skin at bedtime.  . Insulin Pen Needle 31G X 8 MM MISC Use to inject medication 4 times daily  . ipratropium-albuterol (DUONEB) 0.5-2.5 (3) MG/3ML SOLN Take 3 mLs by nebulization every 4 (four) hours as needed.  . isosorbide mononitrate (IMDUR) 60 MG 24 hr tablet Take 1 tablet (60 mg total) by mouth daily.  Marland Kitchen lamoTRIgine (LAMICTAL) 100 MG tablet Take 1 tablet (100 mg total) by mouth 2 (two) times daily.  . Menthol, Topical Analgesic, 3.5 % GEL Apply 1 application topically as needed (for neuropathy in feet).  . metolazone (ZAROXOLYN) 2.5 MG tablet Take 1 tablet (2.5 mg total)  by mouth See admin instructions. Take 1 tablet Monday and Friday only if weight is greater or equal to 269  . Multiple Vitamins-Minerals (MULTIVITAMIN WITH MINERALS) tablet Take 1 tablet by mouth daily.  . Pancrelipase, Lip-Prot-Amyl, 25000 UNITS CPEP Take 25,000-50,000 Units by mouth See admin instructions. 50,000 units with breakfast then 25,000 units with lunch then 50,000 units with dinner (evening meal)  . potassium chloride SA (K-DUR,KLOR-CON) 20 MEQ tablet Take 2 tablets (40 mEq  total) by mouth daily. On the day you take metolazone, take an extra 20 meq (60 meq total)  . pregabalin (LYRICA) 100 MG capsule Take 1 capsule (100 mg total) by mouth 2 (two) times daily.  . Semaglutide (OZEMPIC) 0.25 or 0.5 MG/DOSE SOPN Inject 0.5 mg into the skin once a week.  . sertraline (ZOLOFT) 50 MG tablet Take 1 tablet (50 mg total) by mouth every evening.   No facility-administered encounter medications on file as of 01/01/2018.     Functional Status:   In your present state of health, do you have any difficulty performing the following activities: 11/25/2017 10/21/2017  Hearing? N N  Vision? N N  Difficulty concentrating or making decisions? N N  Walking or climbing stairs? Y Y  Dressing or bathing? Y N  Doing errands, shopping? [name]  Preparing Food and eating ? Y -  Using the Toilet? N -  In the past six months, have you accidently leaked urine? Y -  Do you have problems with loss of bowel control? N -  Managing your Medications? Y -  Managing your Finances? Y -  Housekeeping or managing your Housekeeping? Y -  Some recent data might be hidden    Fall/Depression Screening:    Fall Risk  12/09/2017 12/04/2017 11/25/2017  Falls in the past year? No No No  Number falls in past yr: - - -  Injury with Fall? - - -  Risk Factor Category  - - -  Risk for fall due to : - - -  Follow up - - -   PHQ 2/9 Scores 11/25/2017 02/28/2017 02/15/2017 10/08/2016 09/05/2016 07/23/2016 06/22/2016  PHQ - 2 Score 0 0 0 0 0 0 0    Assessment:    Met with member at scheduled time.  No distress noted.  Primary caregiver remains her sister, state she has her nieces come over often to assist with showering.  Continues to be compliant with medication management and low sodium/diabetic diet.  Completed course with [facility] home [facility], denies any urgent concerns.  Advised to contact this care manager with questions.  Plan:   Will follow up with member next month.  If remain stable, will transition  back to health coach.  [facility] CM Care Plan Problem One     Most Recent Value  Care Plan Problem One  Risk for readmission related to heart failure as evidenced by recent hospitalization  Role Documenting the Problem One  Care Management [place] for Problem One  Active  THN Long Term Goal   Member will not be readmitted to hospital within the next 31 days  THN Long Term Goal Start Date  11/14/17  [facility] Long Term Goal Met Date  01/02/18  [facility] CM Short Term Goal #2   Member will keep and attend follow up with cardiologist within the next 2 weeks  [facility] CM Short Term Goal #2 Start Date  01/01/18 [Not met, date reset]     Valente David, Therapist, sports, MSN [facility]  Care Management  [facility] Manager [phone]

## 2018-01-06 DIAGNOSIS — R0602 Shortness of breath: Secondary | ICD-10-CM | POA: Diagnosis not present

## 2018-01-06 DIAGNOSIS — I10 Essential (primary) hypertension: Secondary | ICD-10-CM | POA: Diagnosis not present

## 2018-01-06 DIAGNOSIS — J9621 Acute and chronic respiratory failure with hypoxia: Secondary | ICD-10-CM | POA: Diagnosis not present

## 2018-01-06 DIAGNOSIS — I5032 Chronic diastolic (congestive) heart failure: Secondary | ICD-10-CM | POA: Diagnosis not present

## 2018-01-06 DIAGNOSIS — I509 Heart failure, unspecified: Secondary | ICD-10-CM | POA: Diagnosis not present

## 2018-01-06 DIAGNOSIS — G4733 Obstructive sleep apnea (adult) (pediatric): Secondary | ICD-10-CM | POA: Diagnosis not present

## 2018-01-06 DIAGNOSIS — G4089 Other seizures: Secondary | ICD-10-CM | POA: Diagnosis not present

## 2018-01-06 DIAGNOSIS — M48061 Spinal stenosis, lumbar region without neurogenic claudication: Secondary | ICD-10-CM | POA: Diagnosis not present

## 2018-01-06 DIAGNOSIS — I5030 Unspecified diastolic (congestive) heart failure: Secondary | ICD-10-CM | POA: Diagnosis not present

## 2018-01-13 DIAGNOSIS — I130 Hypertensive heart and chronic kidney disease with heart failure and stage 1 through stage 4 chronic kidney disease, or unspecified chronic kidney disease: Secondary | ICD-10-CM | POA: Diagnosis not present

## 2018-01-13 DIAGNOSIS — E1140 Type 2 diabetes mellitus with diabetic neuropathy, unspecified: Secondary | ICD-10-CM | POA: Diagnosis not present

## 2018-01-13 DIAGNOSIS — N182 Chronic kidney disease, stage 2 (mild): Secondary | ICD-10-CM | POA: Diagnosis not present

## 2018-01-13 DIAGNOSIS — I5032 Chronic diastolic (congestive) heart failure: Secondary | ICD-10-CM | POA: Diagnosis not present

## 2018-01-20 ENCOUNTER — Encounter: Payer: Self-pay | Admitting: Physician Assistant

## 2018-01-20 ENCOUNTER — Ambulatory Visit: Payer: Medicare HMO | Admitting: Physician Assistant

## 2018-01-20 VITALS — BP 134/72 | HR 62 | Ht 61.0 in | Wt 262.0 lb

## 2018-01-20 DIAGNOSIS — I5033 Acute on chronic diastolic (congestive) heart failure: Secondary | ICD-10-CM | POA: Diagnosis not present

## 2018-01-20 DIAGNOSIS — R5381 Other malaise: Secondary | ICD-10-CM

## 2018-01-20 DIAGNOSIS — R079 Chest pain, unspecified: Secondary | ICD-10-CM | POA: Diagnosis not present

## 2018-01-20 DIAGNOSIS — R0789 Other chest pain: Secondary | ICD-10-CM

## 2018-01-20 NOTE — Patient Instructions (Signed)
Medication Instructions:  Your physician recommends that you continue on your current medications as directed. Please refer to the Current Medication list given to you today.  Labwork: NONE   Testing/Procedures: Your physician has requested that you have a lexiscan myoview. For further information please visit [email or web address]. Please follow instruction sheet, as given. [facility]  Follow-Up: Your physician recommends that you schedule a follow-up appointment in: October with DR [facility].  Any Other Special Instructions Will Be Listed Below (If Applicable). If you need a refill on your cardiac medications before your next appointment, please call your pharmacy.

## 2018-01-20 NOTE — Progress Notes (Signed)
Cardiology Office Note   Date:  01/20/2018   ID:  [name], DOB Aug 05, 1945, MRN [id number]  PCP:  [name], MD  Cardiologist:  Dr Ellyn Hack, 11/19/2017  Rosaria Ferries, PA-C 07/08/2017  Chief Complaint  Patient presents with  . Follow-up    3 months  . Headache  . Shortness of Breath  . Chest Pain    Discomfort.  . Edema    History of Present Illness: [name] is a 72 y.o. female with a history of non-obs CAD at cath 2012, low risk MV 2017, DM, HTN, abnl LFTs, CKD III, COPD, morbid obesity, OHS/OSA>>needs CPAP, D-CHF w/ dry wt 260-263 lbs, CVA, Sz, chronic pancreatitis, venous insuff w/ varicose veins  [name] presents for cardiology follow up. She is here with her sister. Her sister's husband helps w/ transportation also.  She still has problems getting in/out of the house. She is careful going up and down a couple of steps.  It makes her short of breath to go from the house to the car or from the car to the house.  She does laps around the house, will do 3 laps on 3 days a week, 2 laps on other days.   She is able to go to the store and walk around there.   She gets chest tightness sometimes. It can start while sitting still, or with exertion. Certain movements help a little. She just waits it out. It lasts just a few minutes. She will get this 1-2 x week.  She has not been having chest pain consistently with exertion.  She uses O2 at night. Still wakes up sometimes, but has nasal congestion. However, blowing her nose does not help. She got SOB sitting in church yesterday.   She is not using salt, her sister says.  However, they have eaten out recently a couple of times.  She was taken off ASA months ago due to rectal bleeding.  Wonders if she needs to restart it.  Is still not on CPAP, but is using oxygen at night.  She brought her weight sheets with her.  She had gotten down to 253 pounds several days, but jumped back up 3 pounds in a day  and has stayed up at 256-258 since then.  However, this is about 10 pounds down from where she was in May.   Past Medical History:  Diagnosis Date  . Abnormal liver function     in the past.  . Anemia   . Arthritis    all over  . Bruises easily   . Cataract    right eye;immature  . CHF (congestive heart failure) ([place])   . Chronic back pain    stenosis  . Chronic cough   . Chronic kidney disease   . COPD (chronic obstructive pulmonary disease) ([place])   . Demand myocardial infarction [facility]) 2012   Demand Infarction in setting of Pancreatitis --> mild Troponin elevation, Non-obstructive CAD  . Depression    takes Abilify daily as well as Zoloft  . Diastolic heart failure 6440   Grade 1 diastolic Dysfunction by Echo   . Diverticulosis   . DM (diabetes mellitus) ([place])    takes Victoza daily as well as Lantus and Humalog  . Dyspnea   . Empty sella ([place])    on MRI in 2009.  . Glaucoma   . Headache(784.0)    last migraine-4-8yr ago  . History of blood transfusion    no abnormal  reaction noted  . History of colon polyps    benign  . History of influenza    antibiotic given yesterday along with taking Delsym cough syrup  . HTN (hypertension)    takes [employer] daily  . Hyperlipidemia    takes Lipitor daily  . Joint swelling   . Nocturia   . Obstructive sleep apnea   . Pancreatitis    takes Pancrelipase daily  . Parkinson's disease ([place])    takes Sinemet daily  . Peripheral edema    takes Lasix daily  . Peripheral neuropathy   . Pneumonia 2012  . Seizures ([place])    takes Lamictal daily and Primidone nightly;last seizure 2wks ago  . Urinary frequency   . Urinary urgency   . Varicose veins of both lower extremities with pain     Past Surgical History:  Procedure Laterality Date  . ABDOMINAL HYSTERECTOMY    . APPENDECTOMY    . BACK SURGERY    . CARDIAC CATHETERIZATION  2009/March 2012   2009: (Dr. Alvie Heidelberg) Nonobstructive CAD; 2012: Minimal CAD -->  false positive stress test  . Cardiac Event Monitor  September-October 2017   Sinus rhythm with occasional PACs and artifact. No arrhythmias besides one short run of tachycardia.  . CHOLECYSTECTOMY    . COLONOSCOPY N/A 08/15/2012   Procedure: COLONOSCOPY;  Surgeon: Arta Silence, MD;  Location: [facility];  Service: Endoscopy;  Laterality: N/A;  . COLONOSCOPY WITH PROPOFOL Left 06/17/2017   Procedure: COLONOSCOPY WITH PROPOFOL;  Surgeon: [name], MD;  Location: [place];  Service: Gastroenterology;  Laterality: Left;  . ESOPHAGOGASTRODUODENOSCOPY    . eye cysts Bilateral   . LASIK    . NM MYOVIEW LTD  01/2016   LOW RISK. No ischemia or infarction.  Marland Kitchen RECTAL POLYPECTOMY    . TONSILLECTOMY    . TRANSTHORACIC ECHOCARDIOGRAM  01/2016; 05/2018   A) Normal LV chamber size with mod LVH pattern. EF 50-55%. Severe LA dilation. Mod RA dilation. PAP elevated at 38 mmHg;; B) Mild concentric LVH.  EF 55-60% & no RWMA.  Grade 2 DD.  Diastolic flattening of the ventricular septum == ? elevated PAP.  Mild aortic valve calcification/sclerosis.  Mod LA dilation.  Mild RV dilation & Mod RA dilation  . TRANSTHORACIC ECHOCARDIOGRAM  10/2017   EF 55-60%.  No wall motion normality.  Moderate LA dilation.  Mild LA dilation.  Peak PA pressures estimated 57 mmHg (moderate)  . TUBAL LIGATION    . vaginal cyst removed     several times    Current Outpatient Medications  Medication Sig Dispense Refill  . acetaminophen (TYLENOL) 500 MG tablet Take 500 mg by mouth every 4 (four) hours as needed (for pain).     Marland Kitchen atorvastatin (LIPITOR) 10 MG tablet Take 1 tablet (10 mg total) by mouth daily. 30 tablet 0  . B Complex-C (B-COMPLEX WITH VITAMIN C) tablet Take 1 tablet by mouth daily.    . Blood Glucose Monitoring Suppl (ACCU-CHEK NANO SMARTVIEW) W/DEVICE KIT Use to check blood sugar 3 times per day dx code E11.65 1 kit 0  . carbidopa-levodopa (SINEMET IR) 25-100 MG tablet Take 1 tablet by mouth 3 (three) times  daily. 90 tablet 0  . carvedilol (COREG) 12.5 MG tablet Take 1 tablet (12.5 mg total) by mouth 2 (two) times daily with a meal. 60 tablet 0  . Cholecalciferol (VITAMIN D-3) 1000 units CAPS Take 1,000 Units by mouth daily.    . cholestyramine (QUESTRAN) 4 g packet  Take 1 packet (4 g total) by mouth 2 (two) times daily as needed (for diarrhea). 60 each 0  . diclofenac sodium (VOLTAREN) 1 % GEL Apply 2 g topically 4 (four) times daily. 100 g 0  . furosemide (LASIX) 80 MG tablet Take 0.5-1 tablets (40-80 mg total) by mouth daily. Take 80 mg M-W-F, take 40 mg all other days 30 tablet 0  . Insulin Glargine (TOUJEO MAX SOLOSTAR) 300 UNIT/ML SOPN Inject 30 Units into the skin at bedtime. 3 mL 0  . Insulin Pen Needle 31G X 8 MM MISC Use to inject medication 4 times daily 400 each 1  . isosorbide mononitrate (IMDUR) 60 MG 24 hr tablet Take 1 tablet (60 mg total) by mouth daily. 90 tablet 3  . lamoTRIgine (LAMICTAL) 100 MG tablet Take 1 tablet (100 mg total) by mouth 2 (two) times daily. 180 tablet 3  . Menthol, Topical Analgesic, 3.5 % GEL Apply 1 application topically as needed (for neuropathy in feet). 120 mL 0  . metolazone (ZAROXOLYN) 2.5 MG tablet Take 1 tablet (2.5 mg total) by mouth See admin instructions. Take 1 tablet Monday and Friday only if weight is greater or equal to 269 10 tablet 0  . Multiple Vitamins-Minerals (MULTIVITAMIN WITH MINERALS) tablet Take 1 tablet by mouth daily.    . Pancrelipase, Lip-Prot-Amyl, 25000 UNITS CPEP Take 25,000-50,000 Units by mouth See admin instructions. 50,000 units with breakfast then 25,000 units with lunch then 50,000 units with dinner (evening meal)    . potassium chloride SA (K-DUR,KLOR-CON) 20 MEQ tablet Take 2 tablets (40 mEq total) by mouth daily. On the day you take metolazone, take an extra 20 meq (60 meq total) 60 tablet 0  . pregabalin (LYRICA) 100 MG capsule Take 1 capsule (100 mg total) by mouth 2 (two) times daily. 60 capsule 0  . Semaglutide  (OZEMPIC) 0.25 or 0.5 MG/DOSE SOPN Inject 0.5 mg into the skin once a week.    . sertraline (ZOLOFT) 50 MG tablet Take 1 tablet (50 mg total) by mouth every evening. 30 tablet 0   No current facility-administered medications for this visit.     Allergies:   Penicillins; Sulfa antibiotics; Aspirin; Codeine; and Other    Social History:  The patient  reports that she has quit smoking. Her smoking use included cigarettes. She has a 12.50 pack-year smoking history. She has never used smokeless tobacco. She reports that she does not drink alcohol or use drugs.   Family History:  The patient's family history includes Allergies in her father; Cancer in her other; Diabetes in her brother, mother, sister, and son; Heart disease in her father, mother, and sister; Heart failure in her father; Hyperlipidemia in her father, mother, and sister; Hypertension in her brother, father, mother, sister, sister, and son.    ROS:  Please see the history of present illness. All other systems are reviewed and negative.    PHYSICAL EXAM: VS:  BP 134/72 (BP Location: Left Arm, Patient Position: Sitting, Cuff Size: Large)   Pulse 62   Ht '5\' 1"'$  (1.549 m)   Wt 262 lb (118.8 kg)   BMI 49.50 kg/m  , BMI Body mass index is 49.5 kg/m. GEN: Well nourished, well developed, female in no acute distress  HEENT: normal for age  Neck: JVD 8-9 cm, no carotid bruit, no masses Cardiac: RRR; 2/6 murmur, no rubs, or gallops Respiratory: Decreased breath sounds bases bilaterally, normal work of breathing GI: soft, nontender, nondistended, + BS MS: no  deformity or atrophy; 1+ bilateral edema; distal pulses are 2+ in all 4 extremities   Skin: warm and dry, no rash Neuro:  Strength and sensation are intact Psych: euthymic mood, full affect   EKG:  EKG is ordered today. The ekg ordered today demonstrates SR, HR 62, decreased size  MYOVIEW: 0816/2017  There was no ST segment deviation noted during stress.  The study is  normal.  This is a low risk study.  Normal resting and stress perfusion. No ischemia or infarction EF Not calculated ? Due to frequent PAC;s   ECHO: 10/25/2017 - Left ventricle: The cavity size was normal. Systolic function was   normal. The estimated ejection fraction was in the range of 55%   to 60%. Wall motion was normal; there were no regional wall   motion abnormalities. Left ventricular diastolic function   parameters were normal. - Left atrium: The atrium was moderately dilated. - Right atrium: The atrium was mildly dilated. - Atrial septum: Mobile and redundant with no obvious PFO. - Pulmonary arteries: PA peak pressure: 57 mm Hg (S).   Recent Labs: 06/15/2017: ALT 21 10/20/2017: B Natriuretic Peptide 274.3 10/24/2017: Hemoglobin 10.8; Platelets 200 10/25/2017: BUN 19; Creatinine, Ser 1.17; Magnesium 1.7; Potassium 3.5; Sodium 133    Lipid Panel    Component Value Date/Time   CHOL 135 09/30/2015 1047   TRIG 71.0 09/30/2015 1047   HDL 62.50 09/30/2015 1047   CHOLHDL 2 09/30/2015 1047   VLDL 14.2 09/30/2015 1047   LDLCALC 59 09/30/2015 1047   LDLDIRECT 66.0 01/27/2015 1104     Wt Readings from Last 3 Encounters:  01/20/18 262 lb (118.8 kg)  01/01/18 256 lb (116.1 kg)  12/09/17 261 lb (118.4 kg)     Other studies Reviewed: Additional studies/ records that were reviewed today include: office notes, hospital records and testing.  ASSESSMENT AND PLAN:  1.  Acute on chronic diastolic CHF: -She has not taken her usual medications today because of this appointment, including Lasix 80 mg.  She is requested to take it as soon as she gets home. -If her weight has not come down by at least 3 pounds tomorrow, she is to take the metolazone. - She is doing better with sodium and fluid compliance because she rarely has to take the metolazone.  2.  Chest pain, moderate risk for cardiac etiology/chest tightness: - She has not had a stress test in 2 years. -She is at high risk  to have coronary artery disease because of her multiple comorbidities, however her symptoms are atypical. - Get a Lexiscan Myoview and follow-up on the results.  3.  Deconditioning: - She is encouraged to continue to increase her activity and try to get into 10 minutes of continuous activity 3 times a day -She has trouble getting up or down stairs.  I explained that she needs to do some leg strengthening exercises and encouraged her to stand up out of a chair and then sit back down again at least 10 times a day   Current medicines are reviewed at length with the patient today.  The patient does not have concerns regarding medicines.  The following changes have been made:  no change  Labs/ tests ordered today include:  No orders of the defined types were placed in this encounter.    Disposition:   FU with Dr Ellyn Hack  Signed, Rosaria Ferries, PA-C  01/20/2018 10:34 AM    [place] Phone: [phone]; Fax: [phone]  This note was written with the assistance of speech recognition software. Please excuse any transcriptional errors.

## 2018-01-23 ENCOUNTER — Telehealth (HOSPITAL_COMMUNITY): Payer: Self-pay

## 2018-01-23 NOTE — Telephone Encounter (Signed)
Encounter complete. 

## 2018-01-26 ENCOUNTER — Other Ambulatory Visit: Payer: Self-pay | Admitting: Cardiology

## 2018-01-27 ENCOUNTER — Other Ambulatory Visit: Payer: Self-pay

## 2018-01-27 MED ORDER — POTASSIUM CHLORIDE CRYS ER 20 MEQ PO TBCR: EXTENDED_RELEASE_TABLET | ORAL | 2 refills | Status: DC

## 2018-01-28 ENCOUNTER — Other Ambulatory Visit: Payer: Self-pay | Admitting: *Deleted

## 2018-01-28 ENCOUNTER — Ambulatory Visit (HOSPITAL_COMMUNITY)
Admission: RE | Admit: 2018-01-28 | Discharge: 2018-01-28 | Disposition: A | Discharge status: Home / Self Care | Payer: Medicare HMO | Source: Ambulatory Visit | Attending: Cardiology | Admitting: Cardiology

## 2018-01-28 DIAGNOSIS — R079 Chest pain, unspecified: Secondary | ICD-10-CM | POA: Diagnosis not present

## 2018-01-28 DIAGNOSIS — R0789 Other chest pain: Secondary | ICD-10-CM | POA: Diagnosis not present

## 2018-01-28 MED ORDER — ADENOSINE (DIAGNOSTIC) 3 MG/ML IV SOLN
0.5600 mg/kg | Freq: Once | INTRAVENOUS | Status: AC
  Administered 2018-01-28: 66.6 mg via INTRAVENOUS

## 2018-01-28 MED ORDER — TECHNETIUM TC 99M TETROFOSMIN IV KIT
32.0000 | PACK | Freq: Once | INTRAVENOUS | Status: AC | PRN
  Administered 2018-01-28: 32 via INTRAVENOUS
  Filled 2018-01-28: qty 32

## 2018-01-28 NOTE — Patient Outreach (Signed)
[place] [facility]) Care Management  01/28/2018  [name] 1945-07-19 [id number]   Call placed to member's sister/caregiver to follow up on current health condition.  She report member is doing well, has not had any complaints of shortness of breath.  Daily weights and blood sugar checks have remained stable, does continue to have chronic pain in feet/ankles.  She denies any urgent concerns for this care manager, agrees to transition back to health coach for continued management of chronic health conditions.  Will notify primary MD of transition.  [facility] CM Care Plan Problem One     Most Recent Value  THN CM Short Term Goal #2   Member will keep and attend follow up with cardiologist within the next 2 weeks  THN CM Short Term Goal #2 Start Date  01/01/18 [Not met, date reset]  [facility] CM Short Term Goal #2 Met Date  01/28/18     Valente David, RN, MSN [place] Manager [phone]

## 2018-01-29 ENCOUNTER — Ambulatory Visit (HOSPITAL_COMMUNITY)
Admission: RE | Admit: 2018-01-29 | Discharge: 2018-01-29 | Disposition: A | Discharge status: Home / Self Care | Payer: Medicare HMO | Source: Ambulatory Visit | Attending: Internal Medicine | Admitting: Internal Medicine

## 2018-01-29 LAB — MYOCARDIAL PERFUSION IMAGING
LV dias vol: 139 mL (ref 46–106)
LV sys vol: 59 mL
Peak HR: 90 {beats}/min
Rest HR: 67 {beats}/min
SDS: 5
SRS: 10
SSS: 15
TID: 1.09

## 2018-01-29 MED ORDER — TECHNETIUM TC 99M TETROFOSMIN IV KIT: 32.0000 | PACK | Freq: Once | INTRAVENOUS | Status: DC | PRN

## 2018-01-29 MED ORDER — TECHNETIUM TC 99M TETROFOSMIN IV KIT
29.2000 | PACK | Freq: Once | INTRAVENOUS | Status: AC | PRN
  Administered 2018-01-29: 29.2 via INTRAVENOUS

## 2018-01-30 ENCOUNTER — Ambulatory Visit: Payer: Medicare HMO | Admitting: Neurology

## 2018-01-30 ENCOUNTER — Encounter: Payer: Self-pay | Admitting: Neurology

## 2018-01-30 ENCOUNTER — Telehealth: Payer: Self-pay

## 2018-01-30 ENCOUNTER — Encounter: Payer: Self-pay | Admitting: *Deleted

## 2018-01-30 ENCOUNTER — Other Ambulatory Visit: Payer: Self-pay | Admitting: Cardiology

## 2018-01-30 VITALS — BP 174/95 | HR 76 | Ht 61.0 in | Wt 259.0 lb

## 2018-01-30 DIAGNOSIS — G4719 Other hypersomnia: Secondary | ICD-10-CM | POA: Insufficient documentation

## 2018-01-30 DIAGNOSIS — IMO0002 Reserved for concepts with insufficient information to code with codable children: Secondary | ICD-10-CM

## 2018-01-30 DIAGNOSIS — G4733 Obstructive sleep apnea (adult) (pediatric): Secondary | ICD-10-CM

## 2018-01-30 DIAGNOSIS — G4736 Sleep related hypoventilation in conditions classified elsewhere: Secondary | ICD-10-CM

## 2018-01-30 DIAGNOSIS — I5032 Chronic diastolic (congestive) heart failure: Secondary | ICD-10-CM | POA: Diagnosis not present

## 2018-01-30 NOTE — Progress Notes (Signed)
SLEEP MEDICINE CLINIC   Provider:  Larey Seat, M D  Referring Provider: [name], MD Primary Care Physician:  [name], MD  Chief Complaint  Patient presents with  . Follow-up    pt with her sister, rm 35. pt states her heart MD wanted her to follow up today and address her  sleep apnea. pt had a study here in 2017, but never started CPAP treatment. she is told she snores and stops breathing in sleep. in general she complains of shortness of breath.      HPI:  [name] is a 72 year old african american female patient seen here in a RV in the sleep medicine clinic on 01-30-2018, Dr. Glenetta Hew her cardiologist is referring today asking to initiate CPAP.   [name] underwent a split-night polysomnography on 15 April 2016 after she had endorsed the Epworth Sleepiness Scale at 17 points.  BMI at the time was 55, fatigue score 56, her AHI was 59.6/h not very positional dependent, she spent 136 minutes with low oxygen saturation and had many periodic limb movements but few arousals related.  CPAP was started at 5 cm water and increased step-by-step to 9 where the AHI was reduced to 0.0/h however she required additional 2 L of oxygen to help improve the oxygen nadir.  She still has a prolonged hypoxemia time and her second part of the split-night study.  She received a ResMed air fit P 10 that night and small size, she could not afford CPAP and therefore has not had therapy.  She is currently less fatigued and less sleepy than she was in 2017.  She attributes this to her a reduction in weight and to her reduction in fluid burden.  Her past medical history is described in detail below, she still has shortness of breath.  Dr. Glenetta Hew her cardiologist is referring today asking to initiate CPAP.  The patient had been offered a refurbished machine by [employer] at no costs but would have to buy  supplies, about 80 dollars  Per quarter. She declined in 2017, but her sister  urged her to come and ask to renew the offer. She is using oxygen at home already.    [name] is a 72 year old right handed African-American female with a history of Super- obesity, hypertension, shortness of breath and palpitations. These symptoms and risk factors for OSA  led to her referral here today.  She has also family history of coronary artery disease in her father . She has hypertension , which has been exacerbated by anxiety and stress. Recently she doesn't have as many headaches anymore (  better HTN control ) , and she reports compliance with her medication. She was just placed on a cardiac monitor recently and is still wearing the device. She has been multiple times hospitalized for pancreatitis.  She spoke about her experience- coming out of a coma, and having to be resuscitated 3 time ( ? ) .  Her past medical history further lists diabetes type 2 uncontrolled , on insulin, anemia and the year 2014, diabetic polyneuropathy since 2014, autonomic neuropathy, vitamin D deficiency, arthritis and joint pain.  Her hemoglobin A1c was 8.3 on 01/09/2016, B type natruretic peptide was 103.9, total cholesterol was 154, comprehensive metabolic panel showed high glucose fasting sugar of 132, CBC and differential were normal range. It is not clear to me if she takes primidone for seizures or tremor.   Included in her referral chart is a  sleep study from the [facility]/20/[facility] attending physician was Dr. Sheppard Coil, the patient endorsed the Epworth scale at 14 points and was tested with an AHI of 17.9/h  titrated to CPAP pressure of 12 cm water. Total AHI was still 4.6. The patient was prescribed CPAP at 12 cm water pressure without EPR, CPAP was prescribed on 11/21/2011. The patient reports that she was placed on a CPAP was full face mask after recovering from a pancreatitis during the hospitalization. The sleep study took place after her hospitalization at the [facility] in an [place].  She never took the CPAP or more used it. She was claustrophobic. She reports that there was no other interface tried and a full facemask, no desensitization was done. She had been called by the DME multiple times , but declined to see the RT.   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 (My doctor told me" I cannot live by myself anymore" ) She does not have an established bedtime routine. Her bedroom has a TV and she usually runs at night, all night in the background. She also has a bright light in her bedroom on all night to find her way to the bathroom better. She usually goes to her bedroom between 9 and 9:30 PM but she needs to take her insulin at 10 or 10:30 so there is a interruption right away. Some nights she will wake up with palpitations and diaphoresis, likely if she overslept her insulin dosage time. She sleeps on 2 pillows, prefers to sleep on her sides but moves a lot. She usually needs more than 30 minutes between going to bed and falling asleep. She lives with her sister, her husband and her son. She sleeps with the dog in the bedroom and in the bed. Twice at night her dog will call her out to go to the bathroom. She herself has the urge to urinate at least 4-5 times at night. Wakes up spontaneously at about 5 AM but rises not before 6 or 6:30 AM. Usually she feels rested and ready to go at that time, she frequently has a dry mouth in the morning, but no headaches.  Sleep medical history and family sleep history:  He has suffered from insomnia all her life. Chronic insomnia. His was diagnosed with anxiety and depression after the death of her father, in 2. Her insomnia became progressively worse even on psychotropic medications, tranquilizer, antidepressants. She had a tonsillectomy in the 1960. She was not sleepier than her peers in school, he couldn't easily sleep ever.  She  has diastolic CHF - HTN heart disease, CVA. Le edema. She has had back surgery in 2016 and 1970,  hysterectomy and tubal ligation in 1980s, tonsillectomy in the 1960s, cholecystectomy 2012. Continue on home oxygen and CPAP according to her cardiologists notes.   Social history: lives with her sister and brother in [place]. one son, history of third shift work until 2009, worked nights for 20 years. Here accompanied by her sister.   Review of Systems: Out of a complete 14 system review, the patient complains of only the following symptoms, and all other reviewed systems are negative. This patient endorsed aching muscles, muscle cramps, joint swelling, joint pain, incontinence, shortness of breath and snoring, witnessed snoring and apneas, weight gain and fatigue, daytime sleepiness, slurred speech, tremors, the feeling of not getting enough sleep.  Epworth score 17  How likely are you to doze in the following situations: 0 = not likely, 1 = slight chance, 2 = moderate chance, 3 = high chance  Sitting and Reading? 1 Watching Television? 3 Sitting inactive in a public place (theater or meeting)? 2 As a passenger in a car for an hour without a break?1 Lying down in the afternoon when circumstances permit? 3 Sitting and talking to someone? 0 Sitting quietly after lunch without alcohol?1 In a car, while stopped for a few minutes in traffic? 1  Total = 13  , Fatigue severity score decreased to 25/63  , depression score 1/ 15    Social History   Socioeconomic History  . Marital status: Divorced    Spouse name: Not on file  . Number of children: Not on file  . Years of education: Not on file  . Highest education level: Not on file  Occupational History  . Occupation: Disabled    Comment: CNA  Social Needs  . Financial resource strain: Not on file  . Food insecurity:    Worry: Not on file    Inability: Not on file  . Transportation needs:    Medical: Not on file    Non-medical: Not on  file  Tobacco Use  . Smoking status: Former Smoker    Packs/day: 0.50    Years: 25.00    Pack years: 12.50    Types: Cigarettes  . Smokeless tobacco: Never Used  . Tobacco comment: quit smoking 20+ytrs ago  Substance and Sexual Activity  . Alcohol use: No  . Drug use: No  . Sexual activity: Never    Birth control/protection: Surgical  Lifestyle  . Physical activity:    Days per week: Not on file    Minutes per session: Not on file  . Stress: Not on file  Relationships  . Social connections:    Talks on phone: Not on file    Gets together: Not on file    Attends religious service: Not on file    Active member of club or organization: Not on file    Attends meetings of clubs or organizations: Not on file    Relationship status: Not on file  . Intimate partner violence:    Fear of current or ex partner: Not on file    Emotionally abused: Not on file    Physically abused: Not on file    Forced sexual activity: Not on file  Other Topics Concern  . Not on file  Social History Narrative   She lives in [place]. She has lots of family in the area and is accompanied by her sister.   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   She has a distant history of smoking, quit 20 years ago.    Family History  Problem Relation Age of Onset  . Allergies Father   . Heart disease Father        before 36  . Heart failure Father   . Hypertension Father   . Hyperlipidemia Father   . Heart disease Mother   . Diabetes Mother   . Hypertension Mother   . Hyperlipidemia Mother   . Hypertension Sister   . Heart disease Sister        before 33  . Hyperlipidemia Sister   . Diabetes Brother   . Hypertension Brother   . Diabetes Sister   . Hypertension Sister   . Diabetes Son   .  Hypertension Son   . Cancer Other     Past Medical History:  Diagnosis Date  . Abnormal liver function     in the past.  . Anemia   . Arthritis    all over  . Bruises  easily   . Cataract    right eye;immature  . CHF (congestive heart failure) ([place])   . Chronic back pain    stenosis  . Chronic cough   . Chronic kidney disease   . COPD (chronic obstructive pulmonary disease) ([place])   . Demand myocardial infarction [facility]) 2012   Demand Infarction in setting of Pancreatitis --> mild Troponin elevation, Non-obstructive CAD  . Depression    takes Abilify daily as well as Zoloft  . Diastolic heart failure 4158   Grade 1 diastolic Dysfunction by Echo   . Diverticulosis   . DM (diabetes mellitus) ([place])    takes Victoza daily as well as Lantus and Humalog  . Dyspnea   . Empty sella ([place])    on MRI in 2009.  . Glaucoma   . Headache(784.0)    last migraine-4-65yr ago  . History of blood transfusion    no abnormal reaction noted  . History of colon polyps    benign  . History of influenza    antibiotic given yesterday along with taking Delsym cough syrup  . HTN (hypertension)    takes BKinder Morgan Energydaily  . Hyperlipidemia    takes Lipitor daily  . Joint swelling   . Nocturia   . Obstructive sleep apnea   . Pancreatitis    takes Pancrelipase daily  . Parkinson's disease ([place]    takes Sinemet daily  . Peripheral edema    takes Lasix daily  . Peripheral neuropathy   . Pneumonia 2012  . Seizures ([place]    takes Lamictal daily and Primidone nightly;last seizure 2wks ago  . Urinary frequency   . Urinary urgency   . Varicose veins of both lower extremities with pain     Past Surgical History:  Procedure Laterality Date  . ABDOMINAL HYSTERECTOMY    . APPENDECTOMY    . BACK SURGERY    . CARDIAC CATHETERIZATION  2009/March 2012   2009: (Dr. KAlvie Heidelberg Nonobstructive CAD; 2012: Minimal CAD --> false positive stress test  . Cardiac Event Monitor  September-October 2017   Sinus rhythm with occasional PACs and artifact. No arrhythmias besides one short run of tachycardia.  . CHOLECYSTECTOMY    . COLONOSCOPY N/A 08/15/2012   Procedure:  COLONOSCOPY;  Surgeon: WArta Silence MD;  Location: [facility];  Service: Endoscopy;  Laterality: N/A;  . COLONOSCOPY WITH PROPOFOL Left 06/17/2017   Procedure: COLONOSCOPY WITH PROPOFOL;  Surgeon: KRonnette Juniper MD;  [place] [facility]  Service: Gastroenterology;  Laterality: Left;  . ESOPHAGOGASTRODUODENOSCOPY    . eye cysts Bilateral   . LASIK    . NM MYOVIEW LTD  01/2016   LOW RISK. No ischemia or infarction.  .Marland KitchenRECTAL POLYPECTOMY    . TONSILLECTOMY    . TRANSTHORACIC ECHOCARDIOGRAM  01/2016; 05/2018   A) Normal LV chamber size with mod LVH pattern. EF 50-55%. Severe LA dilation. Mod RA dilation. PAP elevated at 38 mmHg;; B) Mild concentric LVH.  EF 55-60% & no RWMA.  Grade 2 DD.  Diastolic flattening of the ventricular septum == ? elevated PAP.  Mild aortic valve calcification/sclerosis.  Mod LA dilation.  Mild RV dilation & Mod RA dilation  . TRANSTHORACIC ECHOCARDIOGRAM  10/2017   EF 55-60%.  No wall motion normality.  Moderate LA dilation.  Mild LA dilation.  Peak PA pressures estimated 57 mmHg (moderate)  . TUBAL LIGATION    . vaginal cyst removed     several times    Current Outpatient Medications  Medication Sig Dispense Refill  . acetaminophen (TYLENOL) 500 MG tablet Take 500 mg by mouth every 4 (four) hours as needed (for pain).     Marland Kitchen atorvastatin (LIPITOR) 10 MG tablet Take 1 tablet (10 mg total) by mouth daily. 30 tablet 0  . B Complex-C (B-COMPLEX WITH VITAMIN C) tablet Take 1 tablet by mouth daily.    . Blood Glucose Monitoring Suppl (ACCU-CHEK NANO SMARTVIEW) W/DEVICE KIT Use to check blood sugar 3 times per day dx code E11.65 1 kit 0  . carvedilol (COREG) 12.5 MG tablet Take 1 tablet (12.5 mg total) by mouth 2 (two) times daily with a meal. 60 tablet 0  . Cholecalciferol (VITAMIN D-3) 1000 units CAPS Take 1,000 Units by mouth daily.    . cholestyramine (QUESTRAN) 4 g packet Take 1 packet (4 g total) by mouth 2 (two) times daily as needed (for diarrhea). 60 each 0   . diclofenac sodium (VOLTAREN) 1 % GEL Apply 2 g topically 4 (four) times daily. 100 g 0  . furosemide (LASIX) 80 MG tablet Take 0.5-1 tablets (40-80 mg total) by mouth daily. Take 80 mg M-W-F, take 40 mg all other days 30 tablet 0  . Insulin Glargine (TOUJEO MAX SOLOSTAR) 300 UNIT/ML SOPN Inject 30 Units into the skin at bedtime. 3 mL 0  . Insulin Pen Needle 31G X 8 MM MISC Use to inject medication 4 times daily 400 each 1  . lamoTRIgine (LAMICTAL) 100 MG tablet Take 1 tablet (100 mg total) by mouth 2 (two) times daily. 180 tablet 3  . Menthol, Topical Analgesic, 3.5 % GEL Apply 1 application topically as needed (for neuropathy in feet). 120 mL 0  . metolazone (ZAROXOLYN) 2.5 MG tablet Take 1 tablet (2.5 mg total) by mouth See admin instructions. Take 1 tablet Monday and Friday only if weight is greater or equal to 269 10 tablet 0  . Multiple Vitamins-Minerals (MULTIVITAMIN WITH MINERALS) tablet Take 1 tablet by mouth daily.    . Pancrelipase, Lip-Prot-Amyl, 25000 UNITS CPEP Take 25,000-50,000 Units by mouth See admin instructions. 50,000 units with breakfast then 25,000 units with lunch then 50,000 units with dinner (evening meal)    . potassium chloride SA (K-DUR,KLOR-CON) 20 MEQ tablet TAKE 2 TABLETS BY MOUTH ONCE DAILY. BUT ON  THE  DAY  YOU  TAKE  METOLAZONE,  TAKE  AN  EXTRA TABLET FOR A TOTAL OF 3 TABLETS (60MEQ) 204 tablet 2  . pregabalin (LYRICA) 100 MG capsule Take 1 capsule (100 mg total) by mouth 2 (two) times daily. 60 capsule 0  . Semaglutide (OZEMPIC) 0.25 or 0.5 MG/DOSE SOPN Inject 0.5 mg into the skin once a week.    . sertraline (ZOLOFT) 50 MG tablet Take 1 tablet (50 mg total) by mouth every evening. 30 tablet 0  . isosorbide mononitrate (IMDUR) 60 MG 24 hr tablet Take 1 tablet (60 mg total) by mouth daily. 90 tablet 3   No current facility-administered medications for this visit.     Allergies as of 01/30/2018 - Review Complete 01/30/2018  Allergen Reaction Noted  .  Penicillins Hives 10/26/2010  . Sulfa antibiotics Hives 10/26/2010  . Aspirin Other (See Comments) 07/12/2017  . Codeine  10/26/2010  . Other Rash  05/18/2012    Vitals: BP (!) 174/95   Pulse 76   Ht _0  (1.549 m)   Wt 259 lb (117.5 kg)   BMI 48.94 kg/m  Last Weight:  Wt Readings from Last 1 Encounters:  01/30/18 259 lb (117.5 kg)   TWS:FKCL mass index is 48.94 kg/m.     Last Height:   Ht Readings from Last 1 Encounters:  01/30/18 _1  (1.549 m)    Physical exam:  General: The patient is awake, alert and appears not in acute distress. The patient is well groomed. Head: Normocephalic, atraumatic. Neck is supple. Mallampati 5, uvula invisible, macroglossia. ,  neck circumference: 17 inches . Nasal airflow congested, Retrognathia is seen.  Cardiovascular:  Regular rate and rhythm , without  murmurs or carotid bruit, and without distended neck veins. Respiratory: Lungs are clear to auscultation. Skin:  With  evidence of ankle edema,reakted skin dystrophy . Trunk: BMI is 49 from 55 in 2017 . The patient's posture is hunched, she limps.   Neurologic exam : The patient is calm and alert, oriented to place and time.   Attention span & concentration ability appears slowed   Speech is fluent, without dysarthria, dysphonia or aphasia.  Mood and affect are improved - not depressed.   Cranial nerves: Pupils are equal and briskly reactive to light. Extraocular movements  in vertical and horizontal planes intact and without nystagmus. Visual fields by finger perimetry are intact.Hearing to finger rub intact.  Facial sensation intact to fine touch. Facial motor strength is symmetric and tongue and uvula move midline. Shoulder shrug was symmetrical.   Motor exam: Normal tone, muscle bulk and symmetric strength in all extremities. Sensory:  Fine touch, pinprick and vibration were tested in all extremities and are attenuated in both feet. Proprioception tested in the upper extremities was  normal. Coordination: Finger-to-nose maneuver  normal without evidence of ataxia, dysmetria or tremor. Gait and station: Patient walks with a 4 prong cane and a wheeled seated walker.  Stance is stooped ,  Turns with 5 Steps.  Deep tendon reflexes: in the  upper and lower extremities are symmetric and intact. Babinski maneuver response is equivocal .  The patient was advised of the nature of the diagnosed sleep disorder , the treatment options and risks for general a health and wellness arising from not treating the condition. I spent more than 45 minutes of face to face time with the patient. Greater than 50% of time was spent in counseling and coordination of care. We have discussed the diagnosis and differential and I answered the patient's questions.     Assessment:  After physical and neurologic examination, review of laboratory studies,  Personal review of imaging studies, reports of other /same  Imaging studies ,  Results of polysomnography/ neurophysiology testing and pre-existing records as far as provided in visit., my assessment is   1) [name] has an unfortunate experience with her first sleep study, may be based on misunderstanding. She was given only a full facemask and could not tolerate it, became claustrophobic and panicked. I have no doubt that this lady still have obstructive sleep apnea and that she should be invited for a split-night polysomnography to treat her.  I like for her to sue the oxygen at night at home, but we will not use oxygen in the baseline part of a SPLITstudy.  I do need to confirm the need for oxygen so CPAP titration can be done with oxygen and we will need  the documented effect on nadir. .   Plan:  Treatment plan and additional workup :  SPLIT night with previous desensitization , with Robin Hyler.  Patient is oxygen dependent at home , but could not afford CPAP 2 years ago.  RV after SPLIT - [employer].    [name] Partridge Sai Moura  MD  01/30/2018  CC: [name], [place] [address] [place], [place] [address]

## 2018-01-30 NOTE — Telephone Encounter (Signed)
Per Sheena in the sleep lab, patient called to cancel her appt this morning with Dr. Rexene Alberts. She will be marked as a no show due to cancellation less than 24 hours.

## 2018-01-30 NOTE — Telephone Encounter (Signed)
*  DISREGARD PREVIOUS MESSAGE - this patient did not cancel her appointment. This was an error.

## 2018-01-31 NOTE — Telephone Encounter (Signed)
Rx(s) sent to pharmacy electronically.  

## 2018-02-03 ENCOUNTER — Other Ambulatory Visit: Payer: Self-pay

## 2018-02-03 DIAGNOSIS — I5033 Acute on chronic diastolic (congestive) heart failure: Secondary | ICD-10-CM

## 2018-02-06 DIAGNOSIS — I5032 Chronic diastolic (congestive) heart failure: Secondary | ICD-10-CM | POA: Diagnosis not present

## 2018-02-06 DIAGNOSIS — I10 Essential (primary) hypertension: Secondary | ICD-10-CM | POA: Diagnosis not present

## 2018-02-06 DIAGNOSIS — G4089 Other seizures: Secondary | ICD-10-CM | POA: Diagnosis not present

## 2018-02-06 DIAGNOSIS — R0602 Shortness of breath: Secondary | ICD-10-CM | POA: Diagnosis not present

## 2018-02-06 DIAGNOSIS — I5030 Unspecified diastolic (congestive) heart failure: Secondary | ICD-10-CM | POA: Diagnosis not present

## 2018-02-06 DIAGNOSIS — J9621 Acute and chronic respiratory failure with hypoxia: Secondary | ICD-10-CM | POA: Diagnosis not present

## 2018-02-06 DIAGNOSIS — M48061 Spinal stenosis, lumbar region without neurogenic claudication: Secondary | ICD-10-CM | POA: Diagnosis not present

## 2018-02-06 DIAGNOSIS — I509 Heart failure, unspecified: Secondary | ICD-10-CM | POA: Diagnosis not present

## 2018-02-06 DIAGNOSIS — G4733 Obstructive sleep apnea (adult) (pediatric): Secondary | ICD-10-CM | POA: Diagnosis not present

## 2018-02-07 ENCOUNTER — Other Ambulatory Visit: Payer: Self-pay | Admitting: *Deleted

## 2018-02-07 NOTE — Patient Outreach (Signed)
[facility]) Care Management  02/07/2018  MADDEN PIAZZA 1945-11-20 [id number]   RN Health Coach attempted #1 follow up outreach call to patient.  Patient was unavailable. HIPPA compliance voicemail message left with return callback number.  Plan: RN will call patient again within 10 business days.  [place] Care Management [phone]

## 2018-02-11 ENCOUNTER — Other Ambulatory Visit: Payer: Self-pay

## 2018-02-11 ENCOUNTER — Ambulatory Visit (HOSPITAL_COMMUNITY): Payer: Medicare HMO | Attending: Cardiovascular Disease

## 2018-02-11 DIAGNOSIS — E669 Obesity, unspecified: Secondary | ICD-10-CM | POA: Insufficient documentation

## 2018-02-11 DIAGNOSIS — I5033 Acute on chronic diastolic (congestive) heart failure: Secondary | ICD-10-CM | POA: Diagnosis not present

## 2018-02-11 DIAGNOSIS — E119 Type 2 diabetes mellitus without complications: Secondary | ICD-10-CM | POA: Diagnosis not present

## 2018-02-11 DIAGNOSIS — E785 Hyperlipidemia, unspecified: Secondary | ICD-10-CM | POA: Insufficient documentation

## 2018-02-11 DIAGNOSIS — I110 Hypertensive heart disease with heart failure: Secondary | ICD-10-CM | POA: Diagnosis not present

## 2018-02-11 DIAGNOSIS — Z6841 Body Mass Index (BMI) 40.0 and over, adult: Secondary | ICD-10-CM | POA: Diagnosis not present

## 2018-02-14 ENCOUNTER — Other Ambulatory Visit: Payer: Self-pay | Admitting: Physician Assistant

## 2018-02-14 ENCOUNTER — Telehealth: Payer: Self-pay | Admitting: Physician Assistant

## 2018-02-14 DIAGNOSIS — I5032 Chronic diastolic (congestive) heart failure: Secondary | ICD-10-CM

## 2018-02-14 NOTE — Telephone Encounter (Signed)
Reviewed her echocardiogram with Dr. Ellyn Hack and called the patient.  Plan: Add hydralazine 10 mg 3 times daily.  Increase Lasix to 80 mg daily for the next 3 days.  She took 80 mg as scheduled today, she will take it on Saturday and Sunday and then again on Monday as scheduled.  Resume previous dosing of 80 mg Monday Wednesday Friday and 40 mg all other days next week.  RN visit for BMET, weight and blood pressure next week.  She repeated the instructions back to me, I have sent a message to Ms Annamaria Boots with the nursing staff to get the RN visit scheduled.  Rosaria Ferries, PA-C 02/14/2018 5:16 PM Bee[place] [phone]

## 2018-02-17 ENCOUNTER — Telehealth: Payer: Self-pay | Admitting: Cardiology

## 2018-02-17 DIAGNOSIS — I5032 Chronic diastolic (congestive) heart failure: Secondary | ICD-10-CM

## 2018-02-17 MED ORDER — HYDRALAZINE HCL 10 MG PO TABS: 10.0000 mg | ORAL_TABLET | Freq: Three times a day (TID) | ORAL | 6 refills | Status: DC

## 2018-02-17 NOTE — Telephone Encounter (Signed)
New Message       Patient checking status of her new Rx, not at pharmacy

## 2018-02-17 NOTE — Telephone Encounter (Signed)
Returned call to patient she stated she spoke to [employer] PA last week.Stated she prescribed a new medication.Stated medication was not sent to pharmacy.After reviewing chart Hydralazine 10 mg three times a day prescribed.Prescription sent to pharmacy.Advised scheduler will call back to schedule appointment with B/P clinic and bmet in 1 week.

## 2018-02-23 DIAGNOSIS — G4733 Obstructive sleep apnea (adult) (pediatric): Secondary | ICD-10-CM

## 2018-02-23 HISTORY — DX: Obstructive sleep apnea (adult) (pediatric): G47.33

## 2018-02-23 HISTORY — PX: OTHER SURGICAL HISTORY: SHX169

## 2018-02-26 ENCOUNTER — Other Ambulatory Visit: Payer: Self-pay | Admitting: *Deleted

## 2018-02-26 NOTE — Patient Outreach (Signed)
[facility] [place] [facility]) Care Management  02/26/2018  PHILIS DOKE [birth year] [id number]   RN Health Coach attempted #2 follow up outreach call to patient.  Patient was unavailable. HIPPA compliance voicemail message left with return callback number.  Plan: RN will call patient again within 10 business days.  [place] Care Management [phone]

## 2018-02-27 ENCOUNTER — Ambulatory Visit (INDEPENDENT_AMBULATORY_CARE_PROVIDER_SITE_OTHER): Payer: Medicare HMO | Admitting: Pharmacist Clinician (PhC)/ Clinical Pharmacy Specialist

## 2018-02-27 ENCOUNTER — Encounter: Payer: Self-pay | Admitting: Pharmacist Clinician (PhC)/ Clinical Pharmacy Specialist

## 2018-02-27 DIAGNOSIS — I5032 Chronic diastolic (congestive) heart failure: Secondary | ICD-10-CM | POA: Diagnosis not present

## 2018-02-27 DIAGNOSIS — I110 Hypertensive heart disease with heart failure: Secondary | ICD-10-CM | POA: Diagnosis not present

## 2018-02-27 MED ORDER — HYDRALAZINE HCL 25 MG PO TABS: 25.0000 mg | ORAL_TABLET | Freq: Three times a day (TID) | ORAL | 3 refills | Status: DC

## 2018-02-27 NOTE — Patient Instructions (Addendum)
Return for a a follow up appointment with Dr. Ellyn Hack in October Call if you have any problems with the increased dose of hydralazine   Your blood pressure today is 148/72  Once you get moved, find your blood pressure cuff and check your blood pressure at home daily.  Keep record of the readings.  Take your BP meds as follows:  Increase hydralazine to 2 tabs (20 mg) three times daily (7-8 am, 2 pm, 8-10pm).  When you run out, pick up the 25 mg tablets and take 1 three times daily   Continue with all other medications  Bring all of your meds, your BP cuff and your record of home blood pressures to your next appointment.  Exercise as you're able, try to walk approximately 30 minutes per day.  Keep salt intake to a minimum, especially watch canned and prepared boxed foods.  Eat more fresh fruits and vegetables and fewer canned items.  Avoid eating in fast food restaurants.    HOW TO TAKE YOUR BLOOD PRESSURE: . Rest 5 minutes before taking your blood pressure. .  Don't smoke or drink caffeinated beverages for at least 30 minutes before. . Take your blood pressure before (not after) you eat. . Sit comfortably with your back supported and both feet on the floor (don't cross your legs). . Elevate your arm to heart level on a table or a desk. . Use the proper sized cuff. It should fit smoothly and snugly around your bare upper arm. There should be enough room to slip a fingertip under the cuff. The bottom edge of the cuff should be 1 inch above the crease of the elbow. . Ideally, take 3 measurements at one sitting and record the average.

## 2018-02-27 NOTE — Progress Notes (Signed)
02/27/2018 [name] 1945-10-12 [id number]   HPI:  [name] is a 72 y.o. female patient of Dr Ellyn Hack, with a PMH below who presents today for hypertension clinic evaluation.  In addition to hypertension, her medical history is significant for NYHA class 2 diastolic heart failure (EF 55-60%), non-occlusive CAD, hyperlipidemia, seizure disorder, and obesity (BMI 48.9).   She reports chronic SOB, using 3 pillows at night as well as elevating her feet when she sleeps.    Her home dry weight should be about 275 and she continues to use her furosemide and metolazone as directed without any significant weight increase.    Blood Pressure Goal:  130/80  Current Medications:  Carvedilol 12.5 mg bid  Hydralazine 10 mg tid (10:30 am, 2 pm, 6:30 pm)  Furosemide 40-80 mg qd  Isosorbide mono 60 mg qd  Metolazone 2.5 mg prn  Family Hx:  Father died at 63 MI (in his sleep)  Mother died from MI, also had renal failure, died at 71  1 sister died RF, heart disease  2 sons, 1 currently in hospital with new DM dx (BS 500), both with hypertension  Social Hx:  Prior smoker, quit > 30 years ago; no alcohol; drinks 3 mugs of coffee per day (black), no sweet tea or soda  Diet:  Sister cooks all meals, tries to prepare a balanced diet daily, lots of salads (bleu cheese dressing), mix of meats; does admit to snack foods most days (chips/crackers); not much white foods recently;  Exercise:  Walks a lot in house (does not sit still well); has been packing house to move  Home BP readings:  Home arm cuff, > 35 years old, has not been using - packed away  Intolerances:   olmesartan increased diarrhea, caused itch Labs:   01/13/2018:  Na 140, K 639, Glu 79, BUN 12, SCr 0.89 - CrCl (wt 118.8kg) 111.9  10/25/2017:    Na 133, K 3.5, Glu 158, BUN 19, SCr 1.17 (in hospital with CHF exacerbation) Wt Readings from Last 3 Encounters:  02/27/18 256 lb 9.6 oz (116.4 kg)  01/30/18 259 lb (117.5 kg)    01/28/18 262 lb (118.8 kg)   BP Readings from Last 3 Encounters:  02/27/18 (!) 148/72  01/30/18 (!) 174/95  01/20/18 134/72   Pulse Readings from Last 3 Encounters:  02/27/18 64  01/30/18 76  01/20/18 62    Current Outpatient Medications  Medication Sig Dispense Refill  . acetaminophen (TYLENOL) 500 MG tablet Take 500 mg by mouth every 4 (four) hours as needed (for pain).     Marland Kitchen atorvastatin (LIPITOR) 10 MG tablet Take 1 tablet (10 mg total) by mouth daily. 30 tablet 0  . B Complex-C (B-COMPLEX WITH VITAMIN C) tablet Take 1 tablet by mouth daily.    . Blood Glucose Monitoring Suppl (ACCU-CHEK NANO SMARTVIEW) W/DEVICE KIT Use to check blood sugar 3 times per day dx code E11.65 1 kit 0  . carvedilol (COREG) 12.5 MG tablet Take 1 tablet (12.5 mg total) by mouth 2 (two) times daily with a meal. 60 tablet 0  . Cholecalciferol (VITAMIN D-3) 1000 units CAPS Take 1,000 Units by mouth daily.    . cholestyramine (QUESTRAN) 4 g packet Take 1 packet (4 g total) by mouth 2 (two) times daily as needed (for diarrhea). 60 each 0  . diclofenac sodium (VOLTAREN) 1 % GEL Apply 2 g topically 4 (four) times daily. 100 g 0  . furosemide (LASIX)  80 MG tablet Take 0.5-1 tablets (40-80 mg total) by mouth daily. Take 80 mg M-W-F, take 40 mg all other days 30 tablet 0  . hydrALAZINE (APRESOLINE) 25 MG tablet Take 1 tablet (25 mg total) by mouth 3 (three) times daily. 90 tablet 3  . Insulin Glargine (TOUJEO MAX SOLOSTAR) 300 UNIT/ML SOPN Inject 30 Units into the skin at bedtime. 3 mL 0  . Insulin Pen Needle 31G X 8 MM MISC Use to inject medication 4 times daily 400 each 1  . isosorbide mononitrate (IMDUR) 60 MG 24 hr tablet Take 1 tablet (60 mg total) by mouth daily. 90 tablet 3  . lamoTRIgine (LAMICTAL) 100 MG tablet Take 1 tablet (100 mg total) by mouth 2 (two) times daily. 180 tablet 3  . Menthol, Topical Analgesic, 3.5 % GEL Apply 1 application topically as needed (for neuropathy in feet). 120 mL 0  .  metolazone (ZAROXOLYN) 2.5 MG tablet Take 1 tablet (2.5 mg total) by mouth See admin instructions. Take 1 tablet Monday and Friday only if weight is greater or equal to 269 10 tablet 0  . Multiple Vitamins-Minerals (MULTIVITAMIN WITH MINERALS) tablet Take 1 tablet by mouth daily.    . Pancrelipase, Lip-Prot-Amyl, 25000 UNITS CPEP Take 25,000-50,000 Units by mouth See admin instructions. 50,000 units with breakfast then 25,000 units with lunch then 50,000 units with dinner (evening meal)    . potassium chloride SA (K-DUR,KLOR-CON) 20 MEQ tablet TAKE 2 TABLETS BY MOUTH ONCE DAILY. BUT ON  THE  DAY  YOU  TAKE  METOLAZONE,  TAKE  AN  EXTRA TABLET FOR A TOTAL OF 3 TABLETS (60MEQ) 204 tablet 2  . potassium chloride SA (K-DUR,KLOR-CON) 20 MEQ tablet Take 2 tablets by mouth daily. On the day you take metolazone, take extra tablet for a total of 3 (42mq) 70 tablet 11  . pregabalin (LYRICA) 100 MG capsule Take 1 capsule (100 mg total) by mouth 2 (two) times daily. 60 capsule 0  . Semaglutide (OZEMPIC) 0.25 or 0.5 MG/DOSE SOPN Inject 0.5 mg into the skin once a week.    . sertraline (ZOLOFT) 50 MG tablet Take 1 tablet (50 mg total) by mouth every evening. 30 tablet 0   No current facility-administered medications for this visit.     Allergies  Allergen Reactions  . Penicillins Hives    Has patient had a PCN reaction causing immediate rash, facial/tongue/throat swelling, SOB or lightheadedness with hypotension: Yes Has patient had a PCN reaction causing severe rash involving mucus membranes or skin necrosis: No Has patient had a PCN reaction that required hospitalization: No Has patient had a PCN reaction occurring within the last 10 years: No If all of the above answers are "NO", then may proceed with Cephalosporin use.   . Sulfa Antibiotics Hives  . Aspirin Other (See Comments)     On aspirin 81 mg - Rectal bleeding in dec 2018  . Codeine     Headache and makes the patient feel "off"  . Other Rash      Bleach    Past Medical History:  Diagnosis Date  . Abnormal liver function     in the past.  . Anemia   . Arthritis    all over  . Bruises easily   . Cataract    right eye;immature  . CHF ([place]estive heart failure) ([place]   . Chronic back pain    stenosis  . Chronic cough   . Chronic kidney disease   . COPD (  chronic obstructive pulmonary disease) ([place])   . Demand myocardial infarction [facility]) 2012   Demand Infarction in setting of Pancreatitis --> mild Troponin elevation, Non-obstructive CAD  . Depression    takes Abilify daily as well as Zoloft  . Diastolic heart failure 0349   Grade 1 diastolic Dysfunction by Echo   . Diverticulosis   . DM (diabetes mellitus) ([place])    takes Victoza daily as well as Lantus and Humalog  . Dyspnea   . Empty sella ([place])    on MRI in 2009.  . Glaucoma   . Headache(784.0)    last migraine-4-34yr ago  . History of blood transfusion    no abnormal reaction noted  . History of colon polyps    benign  . History of influenza    antibiotic given yesterday along with taking Delsym cough syrup  . HTN (hypertension)    takes BKinder Morgan Energydaily  . Hyperlipidemia    takes Lipitor daily  . Joint swelling   . Nocturia   . Obstructive sleep apnea   . Pancreatitis    takes Pancrelipase daily  . Parkinson's disease ([place]    takes Sinemet daily  . Peripheral edema    takes Lasix daily  . Peripheral neuropathy   . Pneumonia 2012  . Seizures ([place]    takes Lamictal daily and Primidone nightly;last seizure 2wks ago  . Urinary frequency   . Urinary urgency   . Varicose veins of both lower extremities with pain     Craig: 155/78  HR 64  Blood pressure (!) 148/72, pulse 64, height 5' 1"  (1.549 m), weight 256 lb 9.6 oz (116.4 kg).  Hypertensive heart disease with chronic diastolic congestive heart failure ([place] Patient with hypertension and diastolic heart failure, blood pressure not to current recommended goal.  Unfortunately  she has packed her home cuff for an upcoming move, so we don't have any home readings for comparison.  Pressure elevated today at 148/72.  Will have her increase the hydralazine to 20 mg tid until current supply is gone, then start with 25 mg tid.  She should (hopefully) find her home cuff in about 2 weeks after her move, and then she should check home BP daily.  She is due to see Dr. HEllyn Hackin about a month, so we can follow up with her after that should she need further BP control.  Also advised her to separate doses of hydralazine by at least 6 hrs.  She was encouraged to increase her daily walking.     KTommy MedalPharmD CPP [facility] [address]STraffordGJaguas [place] [phone]

## 2018-02-27 NOTE — Assessment & Plan Note (Signed)
Patient with hypertension and diastolic heart failure, blood pressure not to current recommended goal.  Unfortunately she has packed her home cuff for an upcoming move, so we don't have any home readings for comparison.  Pressure elevated today at 148/72.  Will have her increase the hydralazine to 20 mg tid until current supply is gone, then start with 25 mg tid.  She should (hopefully) find her home cuff in about 2 weeks after her move, and then she should check home BP daily.  She is due to see Dr. Ellyn Hack in about a month, so we can follow up with her after that should she need further BP control.  Also advised her to separate doses of hydralazine by at least 6 hrs.  She was encouraged to increase her daily walking.

## 2018-02-28 LAB — BASIC METABOLIC PANEL
BUN/Creatinine Ratio: 13 (ref 12–28)
BUN: 12 mg/dL (ref 8–27)
CO2: 26 mmol/L (ref 20–29)
Calcium: 9.3 mg/dL (ref 8.7–10.3)
Chloride: 98 mmol/L (ref 96–106)
Creatinine, Ser: 0.89 mg/dL (ref 0.57–1.00)
GFR calc Af Amer: 76 mL/min/{1.73_m2} (ref >59)
GFR calc non Af Amer: 66 mL/min/{1.73_m2} (ref >59)
Glucose: 63 mg/dL — ABNORMAL LOW (ref 65–99)
Potassium: 4 mmol/L (ref 3.5–5.2)
Sodium: 138 mmol/L (ref 134–144)

## 2018-03-04 ENCOUNTER — Ambulatory Visit (INDEPENDENT_AMBULATORY_CARE_PROVIDER_SITE_OTHER): Payer: Medicare HMO | Admitting: Neurology

## 2018-03-04 DIAGNOSIS — I5032 Chronic diastolic (congestive) heart failure: Secondary | ICD-10-CM

## 2018-03-04 DIAGNOSIS — IMO0002 Reserved for concepts with insufficient information to code with codable children: Secondary | ICD-10-CM

## 2018-03-04 DIAGNOSIS — G4733 Obstructive sleep apnea (adult) (pediatric): Secondary | ICD-10-CM

## 2018-03-04 DIAGNOSIS — G4719 Other hypersomnia: Secondary | ICD-10-CM

## 2018-03-04 DIAGNOSIS — G4736 Sleep related hypoventilation in conditions classified elsewhere: Secondary | ICD-10-CM

## 2018-03-09 ENCOUNTER — Other Ambulatory Visit: Payer: Self-pay | Admitting: Cardiology

## 2018-03-09 DIAGNOSIS — I5032 Chronic diastolic (congestive) heart failure: Secondary | ICD-10-CM | POA: Diagnosis not present

## 2018-03-09 DIAGNOSIS — M48061 Spinal stenosis, lumbar region without neurogenic claudication: Secondary | ICD-10-CM | POA: Diagnosis not present

## 2018-03-09 DIAGNOSIS — J9621 Acute and chronic respiratory failure with hypoxia: Secondary | ICD-10-CM | POA: Diagnosis not present

## 2018-03-09 DIAGNOSIS — I10 Essential (primary) hypertension: Secondary | ICD-10-CM | POA: Diagnosis not present

## 2018-03-09 DIAGNOSIS — I509 Heart failure, unspecified: Secondary | ICD-10-CM | POA: Diagnosis not present

## 2018-03-09 DIAGNOSIS — I5030 Unspecified diastolic (congestive) heart failure: Secondary | ICD-10-CM | POA: Diagnosis not present

## 2018-03-09 DIAGNOSIS — G4733 Obstructive sleep apnea (adult) (pediatric): Secondary | ICD-10-CM | POA: Diagnosis not present

## 2018-03-09 DIAGNOSIS — R0602 Shortness of breath: Secondary | ICD-10-CM | POA: Diagnosis not present

## 2018-03-09 DIAGNOSIS — G4089 Other seizures: Secondary | ICD-10-CM | POA: Diagnosis not present

## 2018-03-11 NOTE — Procedures (Signed)
PATIENT'S NAME:  [name], [name] DOB:      08-Mar-1946      MR#:    [id number]     DATE OF RECORDING: 03/04/2018 REFERRING M.D.:  [name], MD Study Performed:   Baseline Polysomnogram HISTORY:  [name] is a 72 year old African- American female patient seen here in a RV in the sleep medicine clinic on 01-30-2018, Dr. Glenetta Hew her cardiologist is referring today asking to initiate CPAP.  She is currently less fatigued and less sleepy than she was in 2017.  She attributes this to her a reduction in weight and to her reduction in fluid burden, but sleeps reclined. The patient had been offered a refurbished machine by [facility] [employer] at no costs but would have to buy supplies. She declined in 2017, but her sister urged her to come and ask to renew the offer. She has previously been prescribed CPAP on two occasions without following up [facility] ), is using oxygen at home already.   [name] underwent a split-night polysomnography at [place] on 15 April 2016 after she had endorsed the Epworth Sleepiness Scale at 17 points.  BMI at the time was 55, fatigue score 56/63, her AHI was 59.6/h not positional dependent, she spent 136 minutes with low oxygen saturation.  CPAP was started at 5 cm water and increased step-by-step to 9 where the AHI was reduced to 0.0/h however she required additional 2 L of oxygen to help improve the oxygen nadir.  She still has a prolonged hypoxemia time and her second part of the split-night study using a ResMed air fit P 10.  The patient endorsed the Epworth Sleepiness Scale at 13/24 points, FSS 25/63.   The patient's weight 258 pounds with a height of 61 (inches), resulting in a BMI of 48.7 kg/m2. The patient's neck circumference measured 17 inches.  CURRENT MEDICATIONS: Tylenol, Lipitor, Coreg, Questran, Voltaren, Lasix, Lamictal, Metolazone, KlorCon, Lyrica, Ozempic, Zoloft, Imdur.   PROCEDURE:  This is a multichannel digital polysomnogram  utilizing the Somnostar 11.2 system.  Electrodes and sensors were applied and monitored per AASM Specifications.   EEG, EOG, Chin and Limb EMG, were sampled at 200 Hz.  ECG, Snore and Nasal Pressure, Thermal Airflow, Respiratory Effort, CPAP Flow and Pressure, Oximetry was sampled at 50 Hz. Digital video and audio were recorded.      BASELINE STUDY: Lights Out was at 00:04 and Lights On at 06:13.  Total recording time (TRT) was 369.5 minutes, with a total sleep time (TST) of 260.5 minutes.   The patient's sleep latency was 5 minutes.  REM latency was 108.5 minutes.  The sleep efficiency was 70.5 %.     SLEEP ARCHITECTURE: WASO (Wake after sleep onset) was 104 minutes.  There were 49.5 minutes in Stage N1, 63 minutes Stage N2, 120.5 minutes Stage N3 and 27.5 minutes in Stage REM.  The percentage of Stage N1 was 19.%, Stage N2 was 24.2%, Stage N3 was 46.3% and Stage R (REM sleep) was 10.6%.  The arousals were noted as: 57 were spontaneous, 0 were associated with PLMs, and 23 were associated with respiratory events.    RESPIRATORY ANALYSIS:  There were a total of 32 respiratory events:  0 apneas and 32 hypopneas .     The total APNEA/HYPOPNEA INDEX (AHI) was 7.4 /hour and the total RESPIRATORY DISTURBANCE INDEX was 7.4 /hour.  10 events occurred in REM sleep and 44 events in NREM. The REM AHI was 21.8 /hour,  versus a non-REM AHI of 5.7/h. The patient spent 260.5 minutes of total sleep time in the supine position and 0 minutes in non-supine. The supine AHI was 7.4/h versus a non-supine AHI of 0.0.  OXYGEN SATURATION & C02:  The Wake baseline 02 saturation was 90%, with the lowest being 74%. Time spent below 89% saturation equaled 166 minutes. Average End Tidal CO2 during sleep was 44.5 torr. Total sleep time greater than 50 torr was 0 minutes.   PERIODIC LIMB MOVEMENTS:  The patient had a total of 0 Periodic Limb Movements.   Audio and video analysis did not show any abnormal or unusual movements,  behaviors, phonations or vocalizations.  The patient took bathroom breaks. Loud Snoring was noted. EKG was irregular, PACs and PVCs.  IMPRESSION: Poor sleep quality and high sleep fragmentation, poor sleep efficiency.   1. Mild Obstructive Sleep Hypopnea / without frank apnea (OSA). REM accentuated AHI.  2. Prolonged sleep hypoxemia - not hypercapnia. This study was performed without oxygen being supplemented. 3. Primary Snoring. 4. Non-specific abnormal EKG.  RECOMMENDATIONS: Advise full-night, attended, CPAP titration study to optimize therapy.    I certify that I have reviewed the entire raw data recording prior to the issuance of this report in accordance with the Standards of Accreditation of the American Academy of Sleep Medicine (AASM)   Larey Seat, MD   03-11-2018 Diplomat, American Board of Psychiatry and Neurology  Diplomat, American Board of [place] Director, [employer] [facility] [employer]

## 2018-03-12 ENCOUNTER — Telehealth: Payer: Self-pay | Admitting: Neurology

## 2018-03-12 NOTE — Telephone Encounter (Signed)
Called patient to discuss sleep study results. No answer at this time. LVM for the patient to call back.   

## 2018-03-12 NOTE — Telephone Encounter (Signed)
-----   Message from Larey Seat, MD sent at 03/11/2018  5:43 PM EDT ----- SPLIT study was not initiated due to low AHI- This patient slept reclined, had an AHI under 10, prolonged hypoxemia and no hypercapnia.  Based on her low AHI she would not qualify for oxygen at night - but has oxygen provided by outside source already. I can offer a return for CPAP or BiPAP titration and only if the oxygen nadir does not respond would be able to add oxygen and demonstrate her need.  Given that the patient has had 3 previous diagnostic sleep studies with very little cooperation on her part, I want to make sure she understands that the outcome may be a PAP prescription without oxygen qualification.   Please schedule PAP titration only after speaking to the patient.   C.Dohmeier, MD

## 2018-03-13 NOTE — Telephone Encounter (Signed)
Pt has returned call to RN Casey, she is asking for a call back °

## 2018-03-13 NOTE — Telephone Encounter (Signed)
Called the patient back. No answer. LVM informing the patient to call back.

## 2018-03-13 NOTE — Telephone Encounter (Signed)
Was able to call the sister's cell on file and review the results with the patient on that number.   I advised pt that Dr. Brett Fairy reviewed their sleep study results and found that has mild sleep apnea with low oxygenation and recommends that pt be treated with a cpap. Dr. Brett Fairy recommends that pt return for a repeat sleep study in order to properly titrate the cpap and ensure a good mask fit. Pt is agreeable to returning for a titration study. I advised pt that our sleep lab will file with pt's insurance and call pt to schedule the sleep study when we hear back from the pt's insurance regarding coverage of this sleep study. Pt verbalized understanding of results. Pt had no questions at this time but was encouraged to call back if questions arise. Pt states they are in the middle of moving but she will attempt to get the test completed as soon as she can. I informed her to be on the lookout for our call.

## 2018-04-02 ENCOUNTER — Ambulatory Visit (INDEPENDENT_AMBULATORY_CARE_PROVIDER_SITE_OTHER): Payer: Medicare HMO | Admitting: Cardiology

## 2018-04-02 ENCOUNTER — Encounter: Payer: Self-pay | Admitting: Cardiology

## 2018-04-02 VITALS — BP 118/62 | HR 68 | Ht 61.0 in | Wt 252.0 lb

## 2018-04-02 DIAGNOSIS — I110 Hypertensive heart disease with heart failure: Secondary | ICD-10-CM

## 2018-04-02 DIAGNOSIS — Z6841 Body Mass Index (BMI) 40.0 and over, adult: Secondary | ICD-10-CM

## 2018-04-02 DIAGNOSIS — I5032 Chronic diastolic (congestive) heart failure: Secondary | ICD-10-CM | POA: Diagnosis not present

## 2018-04-02 DIAGNOSIS — I83893 Varicose veins of bilateral lower extremities with other complications: Secondary | ICD-10-CM | POA: Diagnosis not present

## 2018-04-02 DIAGNOSIS — I2510 Atherosclerotic heart disease of native coronary artery without angina pectoris: Secondary | ICD-10-CM | POA: Diagnosis not present

## 2018-04-02 DIAGNOSIS — E785 Hyperlipidemia, unspecified: Secondary | ICD-10-CM | POA: Diagnosis not present

## 2018-04-02 NOTE — Progress Notes (Signed)
PCP: [name], MD  Clinic Note: Chief Complaint  Patient presents with  . Follow-up    Doing better.  Dry weight improved.;  Reviewing results of echo and Myoview  . Congestive Heart Failure    Diastolic and right-sided, chronic    HPI: [name] is a 72 y.o. female with a PMH below who presents today for 4 month f/u for mostly diastolic dysfunction related heart failure along with obesity hypoventilation syndrome. She is here to discuss results of Echo & Myoview.  PMH: nonocclusive CAD by cath in 2012, DM II, HTN, morbid obesity with obesity hypoventilation syndrome and OSA not on CPAP, D-CHF, near syncope with palpitations and PACs on monitor, low risk Myoview 01/2016, dry wt 270-273 lbs, vein incompetence contributing to LE edema, CVA, sz, chronic pancreatitis --Last seen by me in April 2019 prior to her recent hospitalization.  [name] was last seen on July 08, 2017 by Rosaria Ferries, PA-C.  (As a follow-up from hospitalization in late December where she presented with a possible stroke type symptoms was thought to be a seizure during sleep).  She had some swelling and was diuresed with IV Lasix.   --Prior to may follow-up: Was taken Lasix 80 mg on Monday and Friday with 40 mg on the other days.  Using metolazone as needed. Continued working on getting home oxygen and a sleep apnea machine-.  Apparently could not afford CPAP.  I last saw her in May 2019 -she was doing fairly well.  New Rx Imdur.; Sliding Scale Lasix   Saw Rosaria Ferries 01/20/2018 -she is nursing having problems getting in and out of the house, mostly because of her ankle.  Was walking laps around the house at least 3 laps 3 days a week and 2000 other days.  Occasional chest discomfort usually while sitting still.  Weight was down to 253 pounds.  Lexiscan Myoview and 2D echo ordered  Seen in CV[facility]) (September 5)- BP -> Increased Hydralazine 25 mg TID  Recent Hospitalizations:   Not since April  2019  Studies Personally Reviewed - (if available, images/films reviewed: From Epic Chart or Care Everywhere)  Echo May 2019: EF 55-60%.  No wall motion normality.  Moderate LA dilation.  Mild LA dilation.  Peak PA pressures estimated 57 mmHg (moderate)  Echo 01/2018: EF 55-60%. Gr 2 DD. RV volume overload 0 PAP ~67 mmHg. Mod RV dilation. Severe LA & RA dilation.  Myoview 01/2018:   EF 55-60%. NO ST changes.  No ischemia or Infarction. NO significant RV enlargement.  LOW RISK.   Interval History: [name] presents today actually doing fairly well.  Her weight has been stable now down roughly 250 253 pounds and the edema has notably improved.  She is in the process of going through sleep apnea evaluation but currently is only using nighttime oxygen not yet CPAP.  She said the swelling is still there the right leg more than left, but is notably improved.  Partly because of diet and trying to increase her exercise, her weight is gone down close to 10 pounds in the last several months.  She is still doing walking in the house, but they are in the process of moving.  Wants to get established with a new home, she will be able do laps in the backyard.  She still has these fleeting episodes of chest pain off and on that can happen with or without exertion.  Not exacerbated by exertion.  Reassured by stress test.  Also notes having intermittent nausea spells but no vomiting.  She has bilateral varicose veins in the legs there is still somewhat swollen but the actual legs himself and not swollen.  She still sleeps on about 2 pillows, but really cannot flat.  Does not really note PND.   Occasional flip-flopping skipped beats but no rapid irregular heartbeats to suggest an arrhythmia. No syncope/near syncope or TIA/amaurosis fugax  Is going to be moving in presumably with her sister and brother-in-law.  Is hoping to have more mobility in the new house.  ROS: A comprehensive was performed. Review of Systems    Constitutional: Positive for malaise/fatigue (Energy level has improved since he is started doing exercise.  Partly because she is so disabled.) and weight loss (Dry weight down another 10 pounds from last visit with me.).  HENT: Negative for congestion and nosebleeds.   Respiratory: Positive for shortness of breath (Somewhat improved with weight loss and improved edema.). Negative for cough.   Gastrointestinal: Positive for heartburn. Negative for blood in stool and melena.  Genitourinary: Negative for hematuria.  Musculoskeletal: Positive for falls (Off and on, no recent) and joint pain (Knees and ankles as well as most joints.).  Neurological: Positive for dizziness (Sometimes orthostatic in the morning.). Negative for speech change and focal weakness.  Psychiatric/Behavioral: Positive for depression (Less depressed now because she is moving). Negative for memory loss. The patient has insomnia.   All other systems reviewed and are negative.  I have reviewed and (if needed) personally updated the patient's problem list, medications, allergies, past medical and surgical history, social and family history.   Past Medical History:  Diagnosis Date  . Abnormal liver function     in the past.  . Anemia   . Arthritis    all over  . Bruises easily   . Cataract    right eye;immature  . Chronic back pain    stenosis  . Chronic cough   . Chronic kidney disease   . COPD (chronic obstructive pulmonary disease) ([place])   . Demand myocardial infarction [facility]) 2012   Demand Infarction in setting of Pancreatitis --> mild Troponin elevation, Non-obstructive CAD  . Depression    takes Abilify daily as well as Zoloft  . Diastolic heart failure 7782   Grade 1 diastolic Dysfunction by Echo   . Diverticulosis   . DM (diabetes mellitus) ([place])    takes Victoza daily as well as Lantus and Humalog  . Empty sella ([place])    on MRI in 2009.  . Glaucoma   . Headache(784.0)    last migraine-4-77yr ago  .  History of blood transfusion    no abnormal reaction noted  . History of colon polyps    benign  . HTN (hypertension)    takes BK[employer] Morgan Energydaily  . Hyperlipidemia    takes Lipitor daily  . Joint swelling   . Nocturia   . Obstructive sleep apnea   . Pancreatitis    takes Pancrelipase daily  . [place]'s [place]    takes Sinemet daily  . Peripheral neuropathy   . Pneumonia 2012  . [place]    takes Lamictal daily and Primidone nightly;last seizure 2wks ago  . Urinary urgency    With increased frequency  . Varicose veins of both lower extremities with pain    With edema.  Takes daily Lasix    Past Surgical History:  Procedure Laterality Date  . ABDOMINAL HYSTERECTOMY    . APPENDECTOMY    .  BACK SURGERY    . CARDIAC CATHETERIZATION  2009/March 2012   2009: (Dr. Alvie Heidelberg) Nonobstructive CAD; 2012: Minimal CAD --> false positive stress test  . Cardiac Event Monitor  September-October 2017   Sinus rhythm with occasional PACs and artifact. No arrhythmias besides one short run of tachycardia.  . CHOLECYSTECTOMY    . COLONOSCOPY N/A 08/15/2012   Procedure: COLONOSCOPY;  Surgeon: Arta Silence, MD;  Location: [facility];  Service: Endoscopy;  Laterality: N/A;  . COLONOSCOPY WITH PROPOFOL Left 06/17/2017   Procedure: COLONOSCOPY WITH PROPOFOL;  Surgeon: [name], MD;  Location: [facility];  Service: Gastroenterology;  Laterality: Left;  . ESOPHAGOGASTRODUODENOSCOPY    . eye cysts Bilateral   . LASIK    . NM MYOVIEW LTD  01/2016; 01/2018   a)LOW RISK. No ischemia or infarction;; b) EF 55-60%. NO ST changes.  No ischemia or Infarction. NO significant RV enlargement.  LOW RISK.  Marland Kitchen RECTAL POLYPECTOMY    . TONSILLECTOMY    . TRANSTHORACIC ECHOCARDIOGRAM  01/2016; 05/2018   A) Normal LV chamber size with mod LVH pattern. EF 50-55%. Severe LA dilation. Mod RA dilation. PAP elevated at 38 mmHg;; B) Mild concentric LVH.  EF 55-60% & no RWMA.  Grade 2 DD.   Diastolic flattening of the ventricular septum == ? elevated PAP.  Mild aortic valve calcification/sclerosis.  Mod LA dilation.  Mild RV dilation & Mod RA dilation  . TRANSTHORACIC ECHOCARDIOGRAM  10/2017; 01/2018   a) EF 55-60%.  No RWMA,  Moderate LA dilation.  Mild LA dilation.  Peak PA pressures ~57 mmHg (moderate);;; b) EF 55-60%.  GRII DD.  Suggestion of RV volume overload with moderate RV dilation.  Severe LA and RA dilation.Marland Kitchen  PA pressure ~67%.  . TUBAL LIGATION    . vaginal cyst removed     several times    Current Meds  Medication Sig  . acetaminophen (TYLENOL) 500 MG tablet Take 500 mg by mouth every 4 (four) hours as needed (for pain).   Marland Kitchen atorvastatin (LIPITOR) 10 MG tablet Take 1 tablet (10 mg total) by mouth daily.  . B Complex-C (B-COMPLEX WITH VITAMIN C) tablet Take 1 tablet by mouth daily.  . Blood Glucose Monitoring Suppl (ACCU-CHEK NANO SMARTVIEW) W/DEVICE KIT Use to check blood sugar 3 times per day dx code E11.65  . carvedilol (COREG) 25 MG tablet TAKE 1 TABLET BY MOUTH TWICE DAILY  . Cholecalciferol (VITAMIN D-3) 1000 units CAPS Take 1,000 Units by mouth daily.  . cholestyramine (QUESTRAN) 4 g packet Take 1 packet (4 g total) by mouth 2 (two) times daily as needed (for diarrhea).  . diclofenac sodium (VOLTAREN) 1 % GEL Apply 2 g topically 4 (four) times daily.  . furosemide (LASIX) 80 MG tablet Take 0.5-1 tablets (40-80 mg total) by mouth daily. Take 80 mg M-W-F, take 40 mg all other days  . hydrALAZINE (APRESOLINE) 25 MG tablet Take 1 tablet (25 mg total) by mouth 3 (three) times daily.  . Insulin Glargine (TOUJEO MAX SOLOSTAR) 300 UNIT/ML SOPN Inject 30 Units into the skin at bedtime.  . Insulin Pen Needle 31G X 8 MM MISC Use to inject medication 4 times daily  . isosorbide mononitrate (IMDUR) 60 MG 24 hr tablet Take 1 tablet (60 mg total) by mouth daily.  Marland Kitchen lamoTRIgine (LAMICTAL) 100 MG tablet Take 1 tablet (100 mg total) by mouth 2 (two) times daily.  . Menthol,  Topical Analgesic, 3.5 % GEL Apply 1 application topically as needed (for  neuropathy in feet).  . metolazone (ZAROXOLYN) 2.5 MG tablet Take 1 tablet (2.5 mg total) by mouth See admin instructions. Take 1 tablet Monday and Friday only if weight is greater or equal to 269  . Multiple Vitamins-Minerals (MULTIVITAMIN WITH MINERALS) tablet Take 1 tablet by mouth daily.  . Pancrelipase, Lip-Prot-Amyl, 25000 UNITS CPEP Take 25,000-50,000 Units by mouth See admin instructions. 50,000 units with breakfast then 25,000 units with lunch then 50,000 units with dinner (evening meal)  . potassium chloride SA (K-DUR,KLOR-CON) 20 MEQ tablet Take 2 tablets by mouth daily. On the day you take metolazone, take extra tablet for a total of 3 (47mq)  . pregabalin (LYRICA) 100 MG capsule Take 1 capsule (100 mg total) by mouth 2 (two) times daily.  . Semaglutide (OZEMPIC) 0.25 or 0.5 MG/DOSE SOPN Inject 0.5 mg into the skin once a week.  . sertraline (ZOLOFT) 50 MG tablet Take 1 tablet (50 mg total) by mouth every evening.    Allergies  Allergen Reactions  . Penicillins Hives    Has patient had a PCN reaction causing immediate rash, facial/tongue/throat swelling, SOB or lightheadedness with hypotension: Yes Has patient had a PCN reaction causing severe rash involving mucus membranes or skin necrosis: No Has patient had a PCN reaction that required hospitalization: No Has patient had a PCN reaction occurring within the last 10 years: No If all of the above answers are "NO", then may proceed with Cephalosporin use.   . Sulfa Antibiotics Hives  . Aspirin Other (See Comments)     On aspirin 81 mg - Rectal bleeding in dec 2018  . Codeine     Headache and makes the patient feel "off"  . Other Rash    Bleach    Social History   Tobacco Use  . Smoking status: Former Smoker    Packs/day: 0.50    Years: 25.00    Pack years: 12.50    Types: Cigarettes  . Smokeless tobacco: Never Used  . Tobacco comment: quit  smoking 20+ytrs ago  Substance Use Topics  . Alcohol use: No  . Drug use: No   Social History   Social History Narrative   [place] in [place] She has lots of family in the area and is accompanied by her sister.   She is a retired [employer]  Disabled secondary to recurrent seizure activity.   She has a distant history of smoking, quit 20 years ago.    family history includes Allergies in her father; Cancer in her other; Diabetes in her brother, mother, sister, and son; Heart disease in her father, mother, and sister; Heart failure in her father; Hyperlipidemia in her father, mother, and sister; Hypertension in her brother, father, mother, sister, sister, and son.  Wt Readings from Last 3 Encounters:  04/02/18 252 lb (114.3 kg)  02/27/18 256 lb 9.6 oz (116.4 kg)  01/30/18 259 lb (117.5 kg)    PHYSICAL EXAM BP 118/62   Pulse 68   Ht 5' 1" (1.549 m)   Wt 252 lb (114.3 kg)   SpO2 97%   BMI 47.61 kg/m  Physical Exam  Constitutional: She is oriented to person, place, and time. She appears well-developed. No distress.  Morbidly obese.  Chronically debilitated appearing.  Well-groomed.  HENT:  Head: Normocephalic and atraumatic.  Neck: Normal range of motion. Neck supple. No hepatojugular reflux and no JVD present. Carotid bruit is not present.  Cardiovascular: Normal rate, regular rhythm, S1 normal, S2 normal, intact distal pulses and normal  pulses.  No extrasystoles are present. PMI is not displaced (Cannot palpate). Exam reveals distant heart sounds. Exam reveals no gallop, no S4 and no friction rub.  Murmur heard.  Low-pitched harsh early systolic murmur is present at the lower left sternal border.  Medium-pitched blowing holosystolic murmur of grade 2/6 is also present at the lower right sternal border. Pulmonary/Chest: Effort normal and breath sounds normal. No respiratory distress. She has no wheezes. She has no rales.  Abdominal: Soft. Bowel sounds are normal. She exhibits no  distension. There is no tenderness.  Morbidly obese, Unable to palpate HSM  Musculoskeletal: Normal range of motion. She exhibits edema (Trivial, difficult to assess diuresis, not noticeable; bilateral varicose veins noted in the lower legs.  Somewhat swollen) and deformity (Both ankles are somewhat swollen and abnormally angulated.).  Neurological: She is alert and oriented to person, place, and time.  Skin:  Bilateral proximal lower leg (knee) swollen varicose veins that are nontender.  Psychiatric: She has a normal mood and affect. Her behavior is normal. Judgment and thought content normal.    Adult ECG Report Not checked  Other studies Reviewed: Additional studies/ records that were reviewed today include:  Recent Labs:   By K PN January 14, 2018: TC 140, TG 61, LDL 59, HDL 69.  A1c 7.6.  BUN/creatinine 12/0.9.  TSH 1.60. Lab Results  Component Value Date   CREATININE 0.89 02/27/2018   BUN 12 02/27/2018   NA 138 02/27/2018   K 4.0 02/27/2018   CL 98 02/27/2018   CO2 26 02/27/2018    ASSESSMENT / PLAN: Problem List Items Addressed This Visit    Chronic diastolic CHF (congestive heart failure), NYHA class 2 ([name]) - Primary (Chronic)    I think we may be able to shoot for a little less Lasix now that her weight dry weight is improved.  We can try to back off to 2 days a week doing 80 mg furosemide as opposed to 3.  We also discussed sliding scale Lasix along with metolazone.  Has not had to use that as much now.   Is on max dose carvedilol and relatively low-dose hydralazine in conjunction with Imdur with improved blood pressure (in lieu of ARB because of CKD).      Coronary artery disease, non-occlusive (Chronic)    Nonocclusive I-Cath and now yet again another negative Myoview.  On statin and beta-blocker as well as Imdur. I do not think that her chest pain episodes are cardiac in nature.  Sound more musculoskeletal in nature.      Dyslipidemia, goal LDL below 100 (Chronic)     On statin.  Followed by PCP.  Well controlled by last labs.  Continue current dose of statin      Hypertensive heart disease with chronic diastolic congestive heart failure (HCC) (Chronic)    Thankfully, her EF remains stable so were talking mostly diastolic dysfunction.  This is leading or contributing to her right-sided volume overload.  This in combination with OSA/OHS.  Continuing beta-blocker with afterload reduction as well as diuretic.      Morbid obesity with BMI of 50.0-59.9, adult ([place]) (Chronic)    She does look healthier now having dropped her weight.  BMI now less than 50.  Has lost at least 10 pounds since the last saw her.  I congratulated her efforts and recommend continued weight loss.      Varicose veins of bilateral lower extremities with other complications (Chronic)    Discussed support stockings  again.  We also discussed importance of staying active and exercising.  Walking is crucial.  Diuretics should not be first option         I spent a total of 30 minutes with the patient and chart review. >  50% of the time was spent in direct patient consultation.   Current medicines are reviewed at length with the patient today.  (+/- concerns) --doing better with meds. The following changes have been made:  See below; no new changes  Patient Instructions  Medication Instructions:  Your Physician recommend you continue on your current medication as directed.     If you need a refill on your cardiac medications before your next appointment, please call your pharmacy.   Lab work: None   Testing/Procedures: None  Follow-Up: At [facility], you and your health needs are our priority.  As part of our continuing mission to provide you with exceptional heart care, we have created designated Provider Care Teams.  These Care Teams include your primary Cardiologist (physician) and Advanced Practice Providers (APPs -  Physician Assistants and Nurse Practitioners) who  all work together to provide you with the care you need, when you need it. You will need a follow up appointment in 6 months.  Please call our office 2 months in advance to schedule this appointment.  You may see Glenetta Hew, MD or one of the following Advanced Practice Providers on your designated Care Team:   Rosaria Ferries, PA-C . Jory Sims, DNP, ANP  Any Other Special Instructions Will Be Listed Below (If Applicable). -Target weight 250       Studies Ordered:   No orders of the defined types were placed in this encounter.     Glenetta Hew, M.D., M.S. Interventional Cardiologist   Pager # [phone] Phone # [phone] [address] [place], [place] [address]   Thank you for choosing Heartcare at [facility]!!

## 2018-04-02 NOTE — Patient Instructions (Signed)
Medication Instructions:  Your Physician recommend you continue on your current medication as directed.     If you need a refill on your cardiac medications before your next appointment, please call your pharmacy.   Lab work: None   Testing/Procedures: None  Follow-Up: At [facility], you and your health needs are our priority.  As part of our continuing mission to provide you with exceptional heart care, we have created designated Provider Care Teams.  These Care Teams include your primary Cardiologist (physician) and Advanced Practice Providers (APPs -  Physician Assistants and Nurse Practitioners) who all work together to provide you with the care you need, when you need it. You will need a follow up appointment in 6 months.  Please call our office 2 months in advance to schedule this appointment.  You may see Glenetta Hew, MD or one of the following Advanced Practice Providers on your designated Care Team:   Rosaria Ferries, PA-C . Jory Sims, DNP, ANP  Any Other Special Instructions Will Be Listed Below (If Applicable). -Target weight 250

## 2018-04-04 ENCOUNTER — Encounter: Payer: Self-pay | Admitting: Cardiology

## 2018-04-04 NOTE — Assessment & Plan Note (Signed)
Nonocclusive I-Cath and now yet again another negative Myoview.  On statin and beta-blocker as well as Imdur. I do not think that her chest pain episodes are cardiac in nature.  Sound more musculoskeletal in nature.

## 2018-04-04 NOTE — Assessment & Plan Note (Signed)
Discussed support stockings again.  We also discussed importance of staying active and exercising.  Walking is crucial.  Diuretics should not be first option

## 2018-04-04 NOTE — Assessment & Plan Note (Signed)
Thankfully, her EF remains stable so were talking mostly diastolic dysfunction.  This is leading or contributing to her right-sided volume overload.  This in combination with OSA/OHS.  Continuing beta-blocker with afterload reduction as well as diuretic.

## 2018-04-04 NOTE — Assessment & Plan Note (Signed)
I think we may be able to shoot for a little less Lasix now that her weight dry weight is improved.  We can try to back off to 2 days a week doing 80 mg furosemide as opposed to 3.  We also discussed sliding scale Lasix along with metolazone.  Has not had to use that as much now.   Is on max dose carvedilol and relatively low-dose hydralazine in conjunction with Imdur with improved blood pressure (in lieu of ARB because of CKD).

## 2018-04-04 NOTE — Assessment & Plan Note (Signed)
On statin.  Followed by PCP.  Well controlled by last labs.  Continue current dose of statin

## 2018-04-04 NOTE — Assessment & Plan Note (Signed)
She does look healthier now having dropped her weight.  BMI now less than 50.  Has lost at least 10 pounds since the last saw her.  I congratulated her efforts and recommend continued weight loss.

## 2018-04-08 ENCOUNTER — Other Ambulatory Visit: Payer: Self-pay | Admitting: Internal Medicine

## 2018-04-08 DIAGNOSIS — I509 Heart failure, unspecified: Secondary | ICD-10-CM | POA: Diagnosis not present

## 2018-04-08 DIAGNOSIS — M48061 Spinal stenosis, lumbar region without neurogenic claudication: Secondary | ICD-10-CM | POA: Diagnosis not present

## 2018-04-08 DIAGNOSIS — G4733 Obstructive sleep apnea (adult) (pediatric): Secondary | ICD-10-CM | POA: Diagnosis not present

## 2018-04-08 DIAGNOSIS — I5030 Unspecified diastolic (congestive) heart failure: Secondary | ICD-10-CM | POA: Diagnosis not present

## 2018-04-08 DIAGNOSIS — J9621 Acute and chronic respiratory failure with hypoxia: Secondary | ICD-10-CM | POA: Diagnosis not present

## 2018-04-08 DIAGNOSIS — G4089 Other seizures: Secondary | ICD-10-CM | POA: Diagnosis not present

## 2018-04-08 DIAGNOSIS — R0602 Shortness of breath: Secondary | ICD-10-CM | POA: Diagnosis not present

## 2018-04-08 DIAGNOSIS — I5032 Chronic diastolic (congestive) heart failure: Secondary | ICD-10-CM | POA: Diagnosis not present

## 2018-04-08 DIAGNOSIS — I10 Essential (primary) hypertension: Secondary | ICD-10-CM | POA: Diagnosis not present

## 2018-04-11 ENCOUNTER — Other Ambulatory Visit: Payer: Self-pay | Admitting: Internal Medicine

## 2018-04-11 DIAGNOSIS — F339 Major depressive disorder, recurrent, unspecified: Secondary | ICD-10-CM

## 2018-04-18 ENCOUNTER — Other Ambulatory Visit: Payer: Self-pay

## 2018-04-18 MED ORDER — IRON 325 (65 FE) MG PO TABS: 325.0000 mg | ORAL_TABLET | Freq: Once | ORAL | 0 refills | Status: DC

## 2018-04-24 ENCOUNTER — Ambulatory Visit (INDEPENDENT_AMBULATORY_CARE_PROVIDER_SITE_OTHER): Payer: Medicare HMO | Admitting: Internal Medicine

## 2018-04-24 ENCOUNTER — Encounter: Payer: Self-pay | Admitting: Internal Medicine

## 2018-04-24 VITALS — BP 112/64 | HR 63 | Temp 97.9°F | Ht 61.0 in | Wt 255.8 lb

## 2018-04-24 DIAGNOSIS — Z23 Encounter for immunization: Secondary | ICD-10-CM

## 2018-04-24 DIAGNOSIS — I130 Hypertensive heart and chronic kidney disease with heart failure and stage 1 through stage 4 chronic kidney disease, or unspecified chronic kidney disease: Secondary | ICD-10-CM | POA: Diagnosis not present

## 2018-04-24 DIAGNOSIS — I69354 Hemiplegia and hemiparesis following cerebral infarction affecting left non-dominant side: Secondary | ICD-10-CM | POA: Diagnosis not present

## 2018-04-24 DIAGNOSIS — G4733 Obstructive sleep apnea (adult) (pediatric): Secondary | ICD-10-CM | POA: Diagnosis not present

## 2018-04-24 DIAGNOSIS — N182 Chronic kidney disease, stage 2 (mild): Secondary | ICD-10-CM

## 2018-04-24 DIAGNOSIS — E1142 Type 2 diabetes mellitus with diabetic polyneuropathy: Secondary | ICD-10-CM

## 2018-04-24 DIAGNOSIS — I5032 Chronic diastolic (congestive) heart failure: Secondary | ICD-10-CM | POA: Diagnosis not present

## 2018-04-24 NOTE — Progress Notes (Signed)
,ss  Subjective:     Patient ID: [name] , female    DOB: 01-02-1946 , 72 y.o.   MRN: [id number]   Chief Complaint  Patient presents with  . Diabetes  . Hypertension    HPI  Diabetes  She presents for her follow-up diabetic visit. She has type 2 diabetes mellitus. There are no hypoglycemic associated symptoms. There are no hypoglycemic complications. Symptoms are stable. Diabetic complications include a CVA and nephropathy. Risk factors for coronary artery disease include diabetes mellitus, dyslipidemia, hypertension, post-menopausal and sedentary lifestyle. Her breakfast blood glucose is taken between 6-7 am. Her breakfast blood glucose range is generally 90-110 mg/dl.  Hypertension  This is a chronic problem. The current episode started more than 1 year ago. The problem is controlled. Hypertensive end-organ damage includes CVA.   Reports compliance with meds.   Past Medical History:  Diagnosis Date  . Abnormal liver function     in the past.  . Anemia   . Arthritis    all over  . Bruises easily   . Cataract    right eye;immature  . Chronic back pain    stenosis  . Chronic cough   . Chronic kidney disease   . COPD (chronic obstructive pulmonary disease) ([place])   . Demand myocardial infarction [facility]) 2012   Demand Infarction in setting of Pancreatitis --> mild Troponin elevation, Non-obstructive CAD  . Depression    takes Abilify daily as well as Zoloft  . Diastolic heart failure 8264   Grade 1 diastolic Dysfunction by Echo   . Diverticulosis   . DM (diabetes mellitus) ([place])    takes Victoza daily as well as Lantus and Humalog  . Empty sella ([place])    on MRI in 2009.  . Glaucoma   . Headache(784.0)    last migraine-4-45yr ago  . History of blood transfusion    no abnormal reaction noted  . History of colon polyps    benign  . HTN (hypertension)    takes B[employer] Energydaily  . Hyperlipidemia    takes Lipitor daily  . Joint swelling   .  Nocturia   . Obstructive sleep apnea   . Pancreatitis    takes Pancrelipase daily  . [place]'s disease [place]    takes Sinemet daily  . Peripheral neuropathy   . Pneumonia 2012  . [place]    takes Lamictal daily and Primidone nightly;last seizure 2wks ago  . Urinary urgency    With increased frequency  . Varicose veins of both lower extremities with pain    With edema.  Takes daily Lasix     Family History  Problem Relation Age of Onset  . Allergies Father   . Heart disease Father        before 651 . Heart failure Father   . Hypertension Father   . Hyperlipidemia Father   . Heart disease Mother   . Diabetes Mother   . Hypertension Mother   . Hyperlipidemia Mother   . Hypertension Sister   . Heart disease Sister        before 626 . Hyperlipidemia Sister   . Diabetes Brother   . Hypertension Brother   . Diabetes Sister   . Hypertension Sister   . Diabetes Son   . Hypertension Son   . Cancer Other      Current Outpatient Medications:  .  acetaminophen (TYLENOL) 500 MG tablet, Take 500 mg by mouth every 4 (four) hours  as needed (for pain). , Disp: , Rfl:  .  atorvastatin (LIPITOR) 10 MG tablet, Take 1 tablet (10 mg total) by mouth daily., Disp: 30 tablet, Rfl: 0 .  B Complex-C (B-COMPLEX WITH VITAMIN C) tablet, Take 1 tablet by mouth daily., Disp: , Rfl:  .  Blood Glucose Monitoring Suppl (ACCU-CHEK NANO SMARTVIEW) W/DEVICE KIT, Use to check blood sugar 3 times per day dx code E11.65, Disp: 1 kit, Rfl: 0 .  carvedilol (COREG) 25 MG tablet, TAKE 1 TABLET BY MOUTH TWICE DAILY (Patient taking differently: 3 (three) times daily. ), Disp: 180 tablet, Rfl: 1 .  Cholecalciferol (VITAMIN D-3) 1000 units CAPS, Take 1,000 Units by mouth daily., Disp: , Rfl:  .  diclofenac sodium (VOLTAREN) 1 % GEL, Apply 2 g topically 4 (four) times daily., Disp: 100 g, Rfl: 0 .  furosemide (LASIX) 80 MG tablet, Take 0.5-1 tablets (40-80 mg total) by mouth daily. Take 80 mg M-W-F, take 40  mg all other days, Disp: 30 tablet, Rfl: 0 .  hydrALAZINE (APRESOLINE) 25 MG tablet, Take 1 tablet (25 mg total) by mouth 3 (three) times daily., Disp: 90 tablet, Rfl: 3 .  Insulin Glargine (TOUJEO MAX SOLOSTAR) 300 UNIT/ML SOPN, Inject 30 Units into the skin at bedtime., Disp: 3 mL, Rfl: 0 .  Insulin Pen Needle 31G X 8 MM MISC, Use to inject medication 4 times daily, Disp: 400 each, Rfl: 1 .  lamoTRIgine (LAMICTAL) 100 MG tablet, Take 1 tablet (100 mg total) by mouth 2 (two) times daily., Disp: 180 tablet, Rfl: 3 .  Menthol, Topical Analgesic, 3.5 % GEL, Apply 1 application topically as needed (for neuropathy in feet)., Disp: 120 mL, Rfl: 0 .  metolazone (ZAROXOLYN) 2.5 MG tablet, Take 1 tablet (2.5 mg total) by mouth See admin instructions. Take 1 tablet Monday and Friday only if weight is greater or equal to 269, Disp: 10 tablet, Rfl: 0 .  Multiple Vitamins-Minerals (MULTIVITAMIN WITH MINERALS) tablet, Take 1 tablet by mouth daily., Disp: , Rfl:  .  OZEMPIC, 1 MG/DOSE, 2 MG/1.5ML SOPN, INJECT (1 MG) SUBCUTANEOUSLY EVERY WEEK ON THE SAME DAY OF EACH WEEK, IN THE ABDOMEN, THIGHS, OR UPPER ARM ROTATING INJECTION SITES, Disp: 1 pen, Rfl: 1 .  potassium chloride SA (K-DUR,KLOR-CON) 20 MEQ tablet, Take 2 tablets by mouth daily. On the day you take metolazone, take extra tablet for a total of 3 (43mq), Disp: 70 tablet, Rfl: 11 .  pregabalin (LYRICA) 100 MG capsule, Take 1 capsule (100 mg total) by mouth 2 (two) times daily., Disp: 60 capsule, Rfl: 0 .  Semaglutide (OZEMPIC) 0.25 or 0.5 MG/DOSE SOPN, Inject 0.5 mg into the skin once a week., Disp: , Rfl:  .  sertraline (ZOLOFT) 50 MG tablet, TAKE 1 TABLET BY MOUTH ONCE DAILY, Disp: 90 tablet, Rfl: 2 .  isosorbide mononitrate (IMDUR) 60 MG 24 hr tablet, Take 1 tablet (60 mg total) by mouth daily., Disp: 90 tablet, Rfl: 3   Allergies  Allergen Reactions  . Penicillins Hives    Has patient had a PCN reaction causing immediate rash, facial/tongue/throat  swelling, SOB or lightheadedness with hypotension: Yes Has patient had a PCN reaction causing severe rash involving mucus membranes or skin necrosis: No Has patient had a PCN reaction that required hospitalization: No Has patient had a PCN reaction occurring within the last 10 years: No If all of the above answers are "NO", then may proceed with Cephalosporin use.   . Sulfa Antibiotics Hives  . Aspirin  Other (See Comments)     On aspirin 81 mg - Rectal bleeding in dec 2018  . Codeine     Headache and makes the patient feel "off"  . Other Rash    Bleach     Review of Systems  Constitutional: Negative.   Respiratory: Negative.   Cardiovascular: Negative.   Gastrointestinal: Negative.   Genitourinary: Negative.   Skin: Negative.   Neurological: Negative.   Psychiatric/Behavioral: Negative.      Today's Vitals   04/24/18 1041  BP: 112/64  Pulse: 63  Temp: 97.9 F (36.6 C)  TempSrc: Oral  Weight: 255 lb 12.8 oz (116 kg)  Height: _0  (1.549 m)  PainSc: 10-Worst pain ever  PainLoc: Foot   Body mass index is 48.33 kg/m.   Objective:  Physical Exam  Constitutional: She is oriented to person, place, and time. She appears well-developed and well-nourished.  HENT:  Head: Normocephalic and atraumatic.  Eyes: EOM are normal.  Cardiovascular: Normal rate, regular rhythm and normal heart sounds.  Pulmonary/Chest: Effort normal and breath sounds normal.  Neurological: She is alert and oriented to person, place, and time.  Psychiatric: She has a normal mood and affect.  Nursing note and vitals reviewed.       Assessment And Plan:     1. Diabetic polyneuropathy associated with type 2 diabetes mellitus ([place])  She was commended on her lifestyle changes and encouraged to keep up the great work. She will continue with Ozempic 51m once weekly. She will rto in 3 months for re-evaluation.   - CMP14+EGFR - Hemoglobin A1c  2. Hypertensive heart and renal disease with heart  failure (HCC)  Controlled. She will continue with current meds. She is encouraged to avoid adding salt to her foods.   3. Chronic diastolic CHF (congestive heart failure), NYHA class 2 (HCC)  Chronic, yet stable. She is also followed by Cardiology.   4. Chronic renal disease, stage II  Chronic. Importance of optimal BS/BP control was discussed with the patient.   5. OSA (obstructive sleep apnea) - not on CPAP  She admits noncompliance with CPAP.  She states she is unable to tolerate the mask that was given to her.  She is encouraged to contact her DME company to request other mask options. Risks associated with untreated OSA was discussed with the patient in full detail.   6. 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 Her strength has gradually improved.   7. Need for vaccination  - Flu vaccine HIGH DOSE PF (Fluzone High dose)        RMaximino Greenland MD

## 2018-04-24 NOTE — Patient Instructions (Signed)

## 2018-04-25 HISTORY — PX: OTHER SURGICAL HISTORY: SHX169

## 2018-04-25 LAB — HEMOGLOBIN A1C
Est. average glucose Bld gHb Est-mCnc: 151 mg/dL
Hgb A1c MFr Bld: 6.9 % — ABNORMAL HIGH (ref 4.8–5.6)

## 2018-04-25 LAB — CMP14+EGFR
ALT: 10 IU/L (ref 0–32)
AST: 15 IU/L (ref 0–40)
Albumin/Globulin Ratio: 1.1 — ABNORMAL LOW (ref 1.2–2.2)
Albumin: 3.8 g/dL (ref 3.5–4.8)
Alkaline Phosphatase: 90 IU/L (ref 39–117)
BUN/Creatinine Ratio: 17 (ref 12–28)
BUN: 15 mg/dL (ref 8–27)
Bilirubin Total: 0.4 mg/dL (ref 0.0–1.2)
CO2: 23 mmol/L (ref 20–29)
Calcium: 9.1 mg/dL (ref 8.7–10.3)
Chloride: 100 mmol/L (ref 96–106)
Creatinine, Ser: 0.9 mg/dL (ref 0.57–1.00)
GFR calc Af Amer: 75 mL/min/{1.73_m2} (ref >59)
GFR calc non Af Amer: 65 mL/min/{1.73_m2} (ref >59)
Globulin, Total: 3.4 g/dL (ref 1.5–4.5)
Glucose: 62 mg/dL — ABNORMAL LOW (ref 65–99)
Potassium: 4.4 mmol/L (ref 3.5–5.2)
Sodium: 137 mmol/L (ref 134–144)
Total Protein: 7.2 g/dL (ref 6.0–8.5)

## 2018-04-25 NOTE — Progress Notes (Signed)
Your liver and kidney function are stable. Your hba1c is 6.9, this is great! I am so proud of you! Keep up the great work! You may decrease to 28 units Toujeo nightly. If you have low BS in AM, please let me know.

## 2018-04-28 ENCOUNTER — Other Ambulatory Visit: Payer: Self-pay | Admitting: Internal Medicine

## 2018-05-01 ENCOUNTER — Other Ambulatory Visit: Payer: Self-pay

## 2018-05-09 DIAGNOSIS — I10 Essential (primary) hypertension: Secondary | ICD-10-CM | POA: Diagnosis not present

## 2018-05-09 DIAGNOSIS — M48061 Spinal stenosis, lumbar region without neurogenic claudication: Secondary | ICD-10-CM | POA: Diagnosis not present

## 2018-05-09 DIAGNOSIS — I5030 Unspecified diastolic (congestive) heart failure: Secondary | ICD-10-CM | POA: Diagnosis not present

## 2018-05-09 DIAGNOSIS — J9621 Acute and chronic respiratory failure with hypoxia: Secondary | ICD-10-CM | POA: Diagnosis not present

## 2018-05-09 DIAGNOSIS — G4089 Other seizures: Secondary | ICD-10-CM | POA: Diagnosis not present

## 2018-05-09 DIAGNOSIS — I5032 Chronic diastolic (congestive) heart failure: Secondary | ICD-10-CM | POA: Diagnosis not present

## 2018-05-09 DIAGNOSIS — I509 Heart failure, unspecified: Secondary | ICD-10-CM | POA: Diagnosis not present

## 2018-05-09 DIAGNOSIS — R0602 Shortness of breath: Secondary | ICD-10-CM | POA: Diagnosis not present

## 2018-05-09 DIAGNOSIS — G4733 Obstructive sleep apnea (adult) (pediatric): Secondary | ICD-10-CM | POA: Diagnosis not present

## 2018-05-15 ENCOUNTER — Observation Stay (HOSPITAL_COMMUNITY)
Admission: EM | Admit: 2018-05-15 | Discharge: 2018-05-17 | Disposition: A | Discharge status: Home / Self Care | Payer: Medicare HMO | Attending: Internal Medicine | Admitting: Internal Medicine

## 2018-05-15 ENCOUNTER — Observation Stay (HOSPITAL_COMMUNITY): Payer: Medicare HMO

## 2018-05-15 ENCOUNTER — Other Ambulatory Visit: Payer: Self-pay

## 2018-05-15 ENCOUNTER — Encounter (HOSPITAL_COMMUNITY): Payer: Self-pay | Admitting: Emergency Medicine

## 2018-05-15 ENCOUNTER — Emergency Department (HOSPITAL_COMMUNITY): Payer: Medicare HMO

## 2018-05-15 DIAGNOSIS — E785 Hyperlipidemia, unspecified: Secondary | ICD-10-CM | POA: Diagnosis not present

## 2018-05-15 DIAGNOSIS — Z8249 Family history of ischemic heart disease and other diseases of the circulatory system: Secondary | ICD-10-CM | POA: Insufficient documentation

## 2018-05-15 DIAGNOSIS — H409 Unspecified glaucoma: Secondary | ICD-10-CM | POA: Insufficient documentation

## 2018-05-15 DIAGNOSIS — Z86010 Personal history of colonic polyps: Secondary | ICD-10-CM | POA: Insufficient documentation

## 2018-05-15 DIAGNOSIS — R079 Chest pain, unspecified: Secondary | ICD-10-CM | POA: Diagnosis not present

## 2018-05-15 DIAGNOSIS — E1142 Type 2 diabetes mellitus with diabetic polyneuropathy: Secondary | ICD-10-CM | POA: Diagnosis not present

## 2018-05-15 DIAGNOSIS — N189 Chronic kidney disease, unspecified: Secondary | ICD-10-CM | POA: Diagnosis not present

## 2018-05-15 DIAGNOSIS — Z87891 Personal history of nicotine dependence: Secondary | ICD-10-CM | POA: Insufficient documentation

## 2018-05-15 DIAGNOSIS — R000 Tachycardia, unspecified: Secondary | ICD-10-CM | POA: Diagnosis not present

## 2018-05-15 DIAGNOSIS — Z9889 Other specified postprocedural states: Secondary | ICD-10-CM | POA: Insufficient documentation

## 2018-05-15 DIAGNOSIS — I5032 Chronic diastolic (congestive) heart failure: Secondary | ICD-10-CM | POA: Diagnosis present

## 2018-05-15 DIAGNOSIS — M069 Rheumatoid arthritis, unspecified: Secondary | ICD-10-CM

## 2018-05-15 DIAGNOSIS — Z90710 Acquired absence of both cervix and uterus: Secondary | ICD-10-CM | POA: Insufficient documentation

## 2018-05-15 DIAGNOSIS — I25118 Atherosclerotic heart disease of native coronary artery with other forms of angina pectoris: Secondary | ICD-10-CM | POA: Diagnosis present

## 2018-05-15 DIAGNOSIS — I8393 Asymptomatic varicose veins of bilateral lower extremities: Secondary | ICD-10-CM | POA: Diagnosis not present

## 2018-05-15 DIAGNOSIS — R0602 Shortness of breath: Secondary | ICD-10-CM | POA: Diagnosis not present

## 2018-05-15 DIAGNOSIS — G4733 Obstructive sleep apnea (adult) (pediatric): Secondary | ICD-10-CM | POA: Diagnosis not present

## 2018-05-15 DIAGNOSIS — Z6841 Body Mass Index (BMI) 40.0 and over, adult: Secondary | ICD-10-CM | POA: Insufficient documentation

## 2018-05-15 DIAGNOSIS — R569 Unspecified convulsions: Secondary | ICD-10-CM | POA: Insufficient documentation

## 2018-05-15 DIAGNOSIS — E1122 Type 2 diabetes mellitus with diabetic chronic kidney disease: Secondary | ICD-10-CM | POA: Insufficient documentation

## 2018-05-15 DIAGNOSIS — Z9109 Other allergy status, other than to drugs and biological substances: Secondary | ICD-10-CM | POA: Insufficient documentation

## 2018-05-15 DIAGNOSIS — M1990 Unspecified osteoarthritis, unspecified site: Secondary | ICD-10-CM | POA: Diagnosis not present

## 2018-05-15 DIAGNOSIS — G20 Parkinson's disease: Secondary | ICD-10-CM | POA: Diagnosis not present

## 2018-05-15 DIAGNOSIS — E1165 Type 2 diabetes mellitus with hyperglycemia: Secondary | ICD-10-CM | POA: Diagnosis present

## 2018-05-15 DIAGNOSIS — K861 Other chronic pancreatitis: Secondary | ICD-10-CM | POA: Diagnosis present

## 2018-05-15 DIAGNOSIS — Z885 Allergy status to narcotic agent status: Secondary | ICD-10-CM | POA: Diagnosis not present

## 2018-05-15 DIAGNOSIS — J9811 Atelectasis: Secondary | ICD-10-CM | POA: Insufficient documentation

## 2018-05-15 DIAGNOSIS — R2681 Unsteadiness on feet: Secondary | ICD-10-CM | POA: Insufficient documentation

## 2018-05-15 DIAGNOSIS — F329 Major depressive disorder, single episode, unspecified: Secondary | ICD-10-CM | POA: Insufficient documentation

## 2018-05-15 DIAGNOSIS — E119 Type 2 diabetes mellitus without complications: Secondary | ICD-10-CM | POA: Diagnosis present

## 2018-05-15 DIAGNOSIS — Z880 Allergy status to penicillin: Secondary | ICD-10-CM | POA: Insufficient documentation

## 2018-05-15 DIAGNOSIS — I130 Hypertensive heart and chronic kidney disease with heart failure and stage 1 through stage 4 chronic kidney disease, or unspecified chronic kidney disease: Secondary | ICD-10-CM | POA: Diagnosis not present

## 2018-05-15 DIAGNOSIS — Z833 Family history of diabetes mellitus: Secondary | ICD-10-CM | POA: Insufficient documentation

## 2018-05-15 DIAGNOSIS — I2510 Atherosclerotic heart disease of native coronary artery without angina pectoris: Secondary | ICD-10-CM | POA: Insufficient documentation

## 2018-05-15 DIAGNOSIS — J449 Chronic obstructive pulmonary disease, unspecified: Secondary | ICD-10-CM | POA: Diagnosis present

## 2018-05-15 DIAGNOSIS — R7989 Other specified abnormal findings of blood chemistry: Secondary | ICD-10-CM | POA: Diagnosis not present

## 2018-05-15 DIAGNOSIS — Z9049 Acquired absence of other specified parts of digestive tract: Secondary | ICD-10-CM | POA: Insufficient documentation

## 2018-05-15 DIAGNOSIS — Z79899 Other long term (current) drug therapy: Secondary | ICD-10-CM | POA: Insufficient documentation

## 2018-05-15 DIAGNOSIS — Z882 Allergy status to sulfonamides status: Secondary | ICD-10-CM | POA: Insufficient documentation

## 2018-05-15 DIAGNOSIS — I69354 Hemiplegia and hemiparesis following cerebral infarction affecting left non-dominant side: Secondary | ICD-10-CM

## 2018-05-15 DIAGNOSIS — I10 Essential (primary) hypertension: Secondary | ICD-10-CM | POA: Diagnosis not present

## 2018-05-15 DIAGNOSIS — Z886 Allergy status to analgesic agent status: Secondary | ICD-10-CM | POA: Insufficient documentation

## 2018-05-15 DIAGNOSIS — N183 Chronic kidney disease, stage 3 unspecified: Secondary | ICD-10-CM | POA: Diagnosis present

## 2018-05-15 DIAGNOSIS — R0789 Other chest pain: Secondary | ICD-10-CM | POA: Diagnosis not present

## 2018-05-15 LAB — HEPATIC FUNCTION PANEL
ALT: 12 U/L (ref 0–44)
AST: 14 U/L — ABNORMAL LOW (ref 15–41)
Albumin: 3.3 g/dL — ABNORMAL LOW (ref 3.5–5.0)
Alkaline Phosphatase: 81 U/L (ref 38–126)
Bilirubin, Direct: 0.1 mg/dL (ref 0.0–0.2)
Indirect Bilirubin: 0.4 mg/dL (ref 0.3–0.9)
Total Bilirubin: 0.5 mg/dL (ref 0.3–1.2)
Total Protein: 7 g/dL (ref 6.5–8.1)

## 2018-05-15 LAB — BASIC METABOLIC PANEL
Anion gap: 7 (ref 5–15)
BUN: 13 mg/dL (ref 8–23)
CO2: 27 mmol/L (ref 22–32)
Calcium: 9.1 mg/dL (ref 8.9–10.3)
Chloride: 102 mmol/L (ref 98–111)
Creatinine, Ser: 0.92 mg/dL (ref 0.44–1.00)
GFR calc Af Amer: >60 mL/min (ref >60)
GFR calc non Af Amer: >60 mL/min (ref >60)
Glucose, Bld: 201 mg/dL — ABNORMAL HIGH (ref 70–99)
Potassium: 5.1 mmol/L (ref 3.5–5.1)
Sodium: 136 mmol/L (ref 135–145)

## 2018-05-15 LAB — GLUCOSE, CAPILLARY
Glucose-Capillary: 135 mg/dL — ABNORMAL HIGH (ref 70–99)
Glucose-Capillary: 94 mg/dL (ref 70–99)
Glucose-Capillary: 69 mg/dL — ABNORMAL LOW (ref 70–99)
Glucose-Capillary: 71 mg/dL (ref 70–99)

## 2018-05-15 LAB — CBC
HCT: 39.3 % (ref 36.0–46.0)
HCT: 36.7 % (ref 36.0–46.0)
Hemoglobin: 11.8 g/dL — ABNORMAL LOW (ref 12.0–15.0)
Hemoglobin: 10.6 g/dL — ABNORMAL LOW (ref 12.0–15.0)
MCH: 23 pg — ABNORMAL LOW (ref 26.0–34.0)
MCH: 22.3 pg — ABNORMAL LOW (ref 26.0–34.0)
MCHC: 30 g/dL (ref 30.0–36.0)
MCHC: 28.9 g/dL — ABNORMAL LOW (ref 30.0–36.0)
MCV: 76.8 fL — ABNORMAL LOW (ref 80.0–100.0)
MCV: 77.3 fL — ABNORMAL LOW (ref 80.0–100.0)
Platelets: 226 10*3/uL (ref 150–400)
Platelets: 247 10*3/uL (ref 150–400)
RBC: 5.12 MIL/uL — ABNORMAL HIGH (ref 3.87–5.11)
RBC: 4.75 MIL/uL (ref 3.87–5.11)
RDW: 16 % — ABNORMAL HIGH (ref 11.5–15.5)
RDW: 15.9 % — ABNORMAL HIGH (ref 11.5–15.5)
WBC: 4.6 10*3/uL (ref 4.0–10.5)
WBC: 4.3 10*3/uL (ref 4.0–10.5)
nRBC: 0 % (ref 0.0–0.2)
nRBC: 0 % (ref 0.0–0.2)

## 2018-05-15 LAB — I-STAT TROPONIN, ED: Troponin i, poc: 0 ng/mL (ref 0.00–0.08)

## 2018-05-15 LAB — LIPID PANEL
Cholesterol: 137 mg/dL (ref 0–200)
HDL: 60 mg/dL (ref >40)
LDL Cholesterol: 53 mg/dL (ref 0–99)
Total CHOL/HDL Ratio: 2.3 RATIO
Triglycerides: 120 mg/dL (ref <150)
VLDL: 24 mg/dL (ref 0–40)

## 2018-05-15 LAB — D-DIMER, QUANTITATIVE: D-Dimer, Quant: 0.81 ug/mL-FEU — ABNORMAL HIGH (ref 0.00–0.50)

## 2018-05-15 LAB — BRAIN NATRIURETIC PEPTIDE: B Natriuretic Peptide: 153.9 pg/mL — ABNORMAL HIGH (ref 0.0–100.0)

## 2018-05-15 LAB — TROPONIN I
Troponin I: <0.03 ng/mL (ref <0.03)
Troponin I: 0.06 ng/mL (ref <0.03)
Troponin I: 0.29 ng/mL (ref <0.03)

## 2018-05-15 LAB — LIPASE, BLOOD: Lipase: 34 U/L (ref 11–51)

## 2018-05-15 LAB — CREATININE, SERUM
Creatinine, Ser: 0.96 mg/dL (ref 0.44–1.00)
GFR calc Af Amer: >60 mL/min (ref >60)
GFR calc non Af Amer: 59 mL/min — ABNORMAL LOW (ref >60)

## 2018-05-15 LAB — HIV ANTIBODY (ROUTINE TESTING W REFLEX): HIV Screen 4th Generation wRfx: NONREACTIVE

## 2018-05-15 MED ORDER — PANCRELIPASE (LIP-PROT-AMYL) 12000-38000 UNITS PO CPEP
24000.0000 [IU] | ORAL_CAPSULE | ORAL | Status: DC | PRN
  Filled 2018-05-15: qty 2

## 2018-05-15 MED ORDER — ISOSORBIDE MONONITRATE ER 60 MG PO TB24
60.0000 mg | ORAL_TABLET | Freq: Every day | ORAL | Status: DC
  Administered 2018-05-15 – 2018-05-16 (×2): 60 mg via ORAL
  Filled 2018-05-15 (×2): qty 1

## 2018-05-15 MED ORDER — ONDANSETRON HCL 4 MG/2ML IJ SOLN: 4.0000 mg | Freq: Four times a day (QID) | INTRAMUSCULAR | Status: DC | PRN

## 2018-05-15 MED ORDER — POTASSIUM CHLORIDE CRYS ER 20 MEQ PO TBCR
20.0000 meq | EXTENDED_RELEASE_TABLET | Freq: Two times a day (BID) | ORAL | Status: DC
  Administered 2018-05-15: 20 meq via ORAL
  Filled 2018-05-15: qty 1

## 2018-05-15 MED ORDER — HYDRALAZINE HCL 25 MG PO TABS
25.0000 mg | ORAL_TABLET | Freq: Three times a day (TID) | ORAL | Status: DC
  Administered 2018-05-15 – 2018-05-17 (×6): 25 mg via ORAL
  Filled 2018-05-15 (×6): qty 1

## 2018-05-15 MED ORDER — NITROGLYCERIN 0.4 MG SL SUBL
SUBLINGUAL_TABLET | SUBLINGUAL | Status: AC
  Filled 2018-05-15: qty 2

## 2018-05-15 MED ORDER — ACETAMINOPHEN 650 MG RE SUPP: 650.0000 mg | Freq: Four times a day (QID) | RECTAL | Status: DC | PRN

## 2018-05-15 MED ORDER — CARVEDILOL 25 MG PO TABS
25.0000 mg | ORAL_TABLET | Freq: Two times a day (BID) | ORAL | Status: DC
  Administered 2018-05-15 – 2018-05-17 (×4): 25 mg via ORAL
  Filled 2018-05-15 (×4): qty 1

## 2018-05-15 MED ORDER — LAMOTRIGINE 100 MG PO TABS
100.0000 mg | ORAL_TABLET | Freq: Two times a day (BID) | ORAL | Status: DC
  Administered 2018-05-15 – 2018-05-17 (×5): 100 mg via ORAL
  Filled 2018-05-15 (×5): qty 1

## 2018-05-15 MED ORDER — ACETAMINOPHEN 325 MG PO TABS
650.0000 mg | ORAL_TABLET | Freq: Four times a day (QID) | ORAL | Status: DC | PRN
  Administered 2018-05-16: 650 mg via ORAL
  Filled 2018-05-15: qty 2

## 2018-05-15 MED ORDER — METOLAZONE 2.5 MG PO TABS
2.5000 mg | ORAL_TABLET | ORAL | Status: DC | PRN
  Filled 2018-05-15: qty 1

## 2018-05-15 MED ORDER — PANCRELIPASE (LIP-PROT-AMYL) 12000-38000 UNITS PO CPEP
48000.0000 [IU] | ORAL_CAPSULE | Freq: Three times a day (TID) | ORAL | Status: DC
  Administered 2018-05-16 – 2018-05-17 (×3): 48000 [IU] via ORAL
  Filled 2018-05-15 (×8): qty 4

## 2018-05-15 MED ORDER — ENOXAPARIN SODIUM 40 MG/0.4ML ~~LOC~~ SOLN: 40.0000 mg | SUBCUTANEOUS | Status: DC

## 2018-05-15 MED ORDER — NITROGLYCERIN 0.4 MG SL SUBL: 0.4000 mg | SUBLINGUAL_TABLET | SUBLINGUAL | Status: DC | PRN

## 2018-05-15 MED ORDER — FUROSEMIDE 80 MG PO TABS
80.0000 mg | ORAL_TABLET | ORAL | Status: DC
  Administered 2018-05-15 – 2018-05-16 (×2): 80 mg via ORAL
  Filled 2018-05-15: qty 1

## 2018-05-15 MED ORDER — PREGABALIN 100 MG PO CAPS
100.0000 mg | ORAL_CAPSULE | Freq: Two times a day (BID) | ORAL | Status: DC
  Administered 2018-05-15 – 2018-05-17 (×5): 100 mg via ORAL
  Filled 2018-05-15 (×5): qty 1

## 2018-05-15 MED ORDER — INSULIN ASPART 100 UNIT/ML ~~LOC~~ SOLN
0.0000 [IU] | SUBCUTANEOUS | Status: DC
  Administered 2018-05-15 – 2018-05-16 (×2): 1 [IU] via SUBCUTANEOUS
  Administered 2018-05-16: 2 [IU] via SUBCUTANEOUS
  Administered 2018-05-16 – 2018-05-17 (×2): 1 [IU] via SUBCUTANEOUS

## 2018-05-15 MED ORDER — IOPAMIDOL (ISOVUE-370) INJECTION 76%
100.0000 mL | Freq: Once | INTRAVENOUS | Status: AC | PRN
  Administered 2018-05-15: 100 mL via INTRAVENOUS

## 2018-05-15 MED ORDER — ATORVASTATIN CALCIUM 10 MG PO TABS
10.0000 mg | ORAL_TABLET | Freq: Every day | ORAL | Status: DC
  Administered 2018-05-15 – 2018-05-17 (×3): 10 mg via ORAL
  Filled 2018-05-15 (×3): qty 1

## 2018-05-15 MED ORDER — SERTRALINE HCL 50 MG PO TABS
50.0000 mg | ORAL_TABLET | Freq: Every day | ORAL | Status: DC
  Administered 2018-05-16: 50 mg via ORAL
  Filled 2018-05-15 (×3): qty 1

## 2018-05-15 MED ORDER — FUROSEMIDE 40 MG PO TABS: 40.0000 mg | ORAL_TABLET | ORAL | Status: DC

## 2018-05-15 MED ORDER — NITROGLYCERIN 0.4 MG SL SUBL
0.8000 mg | SUBLINGUAL_TABLET | Freq: Once | SUBLINGUAL | Status: AC
  Administered 2018-05-15: 0.8 mg via SUBLINGUAL

## 2018-05-15 MED ORDER — ONDANSETRON HCL 4 MG PO TABS: 4.0000 mg | ORAL_TABLET | Freq: Four times a day (QID) | ORAL | Status: DC | PRN

## 2018-05-15 NOTE — Progress Notes (Signed)
Pt experience mild SOB, RN auscultated lungs, Lungs sound clear, Pt  O2 stat at 98% on 2L/O2. RN gave IS to Pt w/ education on use. Goal sat at 1250, Pt only achieved 1000 w/ 6 attempts

## 2018-05-15 NOTE — Consult Note (Addendum)
Cardiology Consultation:   Patient ID: [name]; [id number]; 07/30/1945   Admit date: 05/15/2018 Date of Consult: 05/15/2018  Primary Care Provider: [name], MD Primary Cardiologist: Glenetta Hew, MD Primary Electrophysiologist:  None   Patient Profile:   [name] is a 72 y.o. female with a PMH of chronic diastolic CHF, DM type 2, HLD, obesity hypoventilation syndrome on O2 via [place] qHS, OSA not yet started on CPAP, seizure disorder, and chronic pancreatitis, who is being seen today for the evaluation of chest pain at the request of Dr. Eliseo Squires.  History of Present Illness:   [name] was in her usual stat of health until 11pm 05/14/18 when she developed sudden onset substernal chest pain while sitting in her recliner. She initially described a sharp pain which was followed by a pressure sensation. She reported radiation of pain to bilateral shoulders and left jaw and associated nausea and SOB. She was given 2 SL nitro by EMS en route to the ED with improvement in pain. Pain resolved after 1-1.5 hours.   She was last seen by Dr. Ellyn Hack 04/02/18 and was doing well at that time. She reported occasional chest pain at rest which was felt to be non-cardiac and more likely MSK related. She was recommended to continue statin, BBlocker, and imdur. Her last ischemic evaluation was a NST in 01/2018 which was negative for ischemia. She had a cardiac catheterization in 2009 which revealed normal coronary arteries. Her last echo 01/2018 showed EF 55-60%, no wall motion abnormalities, G2DD, severely dilated LA/RV/RA, RV systolic flattening c/w RV pressure overload, and moderate pulmHTN. She reports family history of CAD in her father who died from an MI in his 77s and sister who died from an MI in her 38s.  At the time of this evaluation she is chest pain free. She has been working on weight loss this year and reports losing ~30lbs. She has been walking in her driveway over the past  several weeks and has some SOB with activity and occasional brief chest pressure which resolves with rest. She has chronic orthopnea which is unchanged. She denies significant change in chronic LE edema. No episodes of syncope. She reports some increased stress recently as her estranged brother recently contacted her and is currently admitted to [facility] course: initially hypertensive (improved), otherwise VSS. Labs notable for electrolytes wnl, Cr 0.92, Hgb 11.8, Trop negative x2, BNP 153, Ddimer 0.81. CXR without acute findings. EKG with sinus rhythm with isolated TWI in aVL, otherwise no STE/D; no significant change from previous. Patient was given ASA 333m and 2 SL nitro en route to the ED with resolution of chest pain. Cardiology asked to evaluate.   Past Medical History:  Diagnosis Date  . Abnormal liver function     in the past.  . Anemia   . Arthritis    all over  . Bruises easily   . Cataract    right eye;immature  . Chronic back pain    stenosis  . Chronic cough   . Chronic kidney disease   . COPD (chronic obstructive pulmonary disease) ([place]   . [place] myocardial infarction ([facility] 2012   Demand Infarction in setting of Pancreatitis --> mild Troponin elevation, Non-obstructive CAD  . Depression    takes Abilify daily as well as Zoloft  . Diastolic heart failure 24097  Grade 1 diastolic Dysfunction by Echo   . Diverticulosis   . DM (diabetes mellitus) ([place]  takes Victoza daily as well as Lantus and Humalog  . Empty sella ([place])    on MRI in 2009.  . Glaucoma   . Headache(784.0)    last migraine-4-88yr ago  . History of blood transfusion    no abnormal reaction noted  . History of colon polyps    benign  . HTN (hypertension)    takes BKinder Morgan Energydaily  . Hyperlipidemia    takes Lipitor daily  . Joint swelling   . Nocturia   . Obstructive sleep apnea   . Pancreatitis    takes Pancrelipase daily  . Parkinson's disease  ([place]    takes Sinemet daily  . Peripheral neuropathy   . Pneumonia 2012  . Seizures ([place]    takes Lamictal daily and Primidone nightly;last seizure 2wks ago  . Urinary urgency    With increased frequency  . Varicose veins of both lower extremities with pain    With edema.  Takes daily Lasix    Past Surgical History:  Procedure Laterality Date  . ABDOMINAL HYSTERECTOMY    . APPENDECTOMY    . BACK SURGERY    . CARDIAC CATHETERIZATION  2009/March 2012   2009: (Dr. KAlvie Heidelberg Nonobstructive CAD; 2012: Minimal CAD --> false positive stress test  . Cardiac Event Monitor  September-October 2017   Sinus rhythm with occasional PACs and artifact. No arrhythmias besides one short run of tachycardia.  . CHOLECYSTECTOMY    . COLONOSCOPY N/A 08/15/2012   Procedure: COLONOSCOPY;  Surgeon: WArta Silence MD;  Location: [facility];  Service: Endoscopy;  Laterality: N/A;  . COLONOSCOPY WITH PROPOFOL Left 06/17/2017   Procedure: COLONOSCOPY WITH PROPOFOL;  Surgeon: KRonnette Juniper MD;  [facility] [facility] [place]  Service: Gastroenterology;  Laterality: Left;  . ESOPHAGOGASTRODUODENOSCOPY    . eye cysts Bilateral   . LASIK    . NM MYOVIEW LTD  01/2016; 01/2018   a)LOW RISK. No ischemia or infarction;; b) EF 55-60%. NO ST changes.  No ischemia or Infarction. NO significant RV enlargement.  LOW RISK.  .Marland KitchenRECTAL POLYPECTOMY    . TONSILLECTOMY    . TRANSTHORACIC ECHOCARDIOGRAM  01/2016; 05/2018   A) Normal LV chamber size with mod LVH pattern. EF 50-55%. Severe LA dilation. Mod RA dilation. PAP elevated at 38 mmHg;; B) Mild concentric LVH.  EF 55-60% & no RWMA.  Grade 2 DD.  Diastolic flattening of the ventricular septum == ? elevated PAP.  Mild aortic valve calcification/sclerosis.  Mod LA dilation.  Mild RV dilation & Mod RA dilation  . TRANSTHORACIC ECHOCARDIOGRAM  10/2017; 01/2018   a) EF 55-60%.  No RWMA,  Moderate LA dilation.  Mild LA dilation.  Peak PA pressures ~57 mmHg (moderate);;; b) EF 55-60%.   GRII DD.  Suggestion of RV volume overload with moderate RV dilation.  Severe LA and RA dilation..Marland Kitchen PA pressure ~67%.  . TUBAL LIGATION    . vaginal cyst removed     several times     Home Medications:  Prior to Admission medications   Medication Sig Start Date End Date Taking? Authorizing Provider  acetaminophen (TYLENOL) 500 MG tablet Take 500 mg by mouth every 4 (four) hours as needed (for pain).    Yes [provider]  atorvastatin (LIPITOR) 10 MG tablet Take 1 tablet (10 mg total) by mouth daily. 11/06/17  Yes [place]-Vargas, Monina C, NP  B Complex-C (B-COMPLEX WITH VITAMIN C) tablet Take 1 tablet by mouth daily.   Yes [provider]  Blood Glucose Monitoring  Suppl (ACCU-CHEK NANO SMARTVIEW) W/DEVICE KIT Use to check blood sugar 3 times per day dx code E11.65 07/19/14  Yes Elayne Snare, MD  carvedilol (COREG) 25 MG tablet TAKE 1 TABLET BY MOUTH TWICE DAILY Patient taking differently: Take 25 mg by mouth 2 (two) times daily with a meal.  03/10/18  Yes Leonie Man, MD  Cholecalciferol (VITAMIN D-3) 1000 units CAPS Take 1,000 Units by mouth daily.   Yes [provider]  diclofenac sodium (VOLTAREN) 1 % GEL Apply 2 g topically 4 (four) times daily. Patient taking differently: Apply 2 g topically 4 (four) times daily as needed (pain).  11/07/17  Yes [place]-Vargas, Monina C, NP  DROPLET PEN NEEDLES 31G X 8 MM MISC USE DAILY WITH VICTOZA AND/OR NOVOLOG.  04/29/18  Yes [name], MD  furosemide (LASIX) 80 MG tablet Take 0.5-1 tablets (40-80 mg total) by mouth daily. Take 80 mg M-W-F, take 40 mg all other days Patient taking differently: Take 40-80 mg by mouth See admin instructions. Take 80 mg M--F, take 40 mg all other days 11/06/17  Yes [place]-Vargas, Monina C, NP  hydrALAZINE (APRESOLINE) 25 MG tablet Take 1 tablet (25 mg total) by mouth 3 (three) times daily. 02/27/18 05/28/18 Yes Leonie Man, MD  lamoTRIgine (LAMICTAL) 100 MG tablet Take 1 tablet (100 mg  total) by mouth 2 (two) times daily. 12/09/17  Yes Cameron Sprang, MD  Menthol, Topical Analgesic, 3.5 % GEL Apply 1 application topically as needed (for neuropathy in feet). 11/07/17  Yes [place]-Vargas, Monina C, NP  metolazone (ZAROXOLYN) 2.5 MG tablet Take 1 tablet (2.5 mg total) by mouth See admin instructions. Take 1 tablet Monday and Friday only if weight is greater or equal to 269 11/07/17  Yes [place]-Vargas, Monina C, NP  Multiple Vitamins-Minerals (MULTIVITAMIN WITH MINERALS) tablet Take 1 tablet by mouth daily.   Yes [provider]  OZEMPIC, 1 MG/DOSE, 2 MG/1.5ML SOPN INJECT (1 MG) SUBCUTANEOUSLY EVERY WEEK ON THE SAME DAY OF EACH WEEK, IN THE ABDOMEN, THIGHS, OR UPPER ARM ROTATING INJECTION SITES Patient taking differently: Inject 1 mg into the skin once a week.  04/08/18  Yes [name], MD  Pancrelipase, Lip-Prot-Amyl, (ZENPEP) 25000-79000 units CPEP Take 1-2 capsules by mouth See admin instructions. Take 2 capsules with meals and 1 capsule with snacks   Yes [provider]  potassium chloride SA (K-DUR,KLOR-CON) 20 MEQ tablet Take 2 tablets by mouth daily. On the day you take metolazone, take extra tablet for a total of 3 (39mq) 01/31/18  Yes HLeonie Man MD  pregabalin (LYRICA) 100 MG capsule Take 1 capsule (100 mg total) by mouth 2 (two) times daily. 11/07/17  Yes [place]-Vargas, Monina C, NP  sertraline (ZOLOFT) 50 MG tablet TAKE 1 TABLET BY MOUTH ONCE DAILY Patient taking differently: Take 50 mg by mouth daily.  04/14/18  Yes SGlendale Chard MD  Insulin Glargine (TOUJEO MAX SOLOSTAR) 300 UNIT/ML SOPN Inject 30 Units into the skin at bedtime. Patient not taking: Reported on 05/15/2018 11/06/17   [place]-Vargas, Monina C, NP  isosorbide mononitrate (IMDUR) 60 MG 24 hr tablet Take 1 tablet (60 mg total) by mouth daily. 11/19/17 04/02/18  HLeonie Man MD    Inpatient Medications: Scheduled Meds: . atorvastatin  10 mg Oral Daily  . carvedilol  25 mg Oral BID WC   . enoxaparin (LOVENOX) injection  40 mg Subcutaneous Q24H  . [START ON 05/17/2018] furosemide  40 mg Oral Once per day on Sun Sat  .  furosemide  80 mg Oral Once per day on Mon Tue Wed Thu Fri  . hydrALAZINE  25 mg Oral TID  . insulin aspart  0-9 Units Subcutaneous Q4H  . isosorbide mononitrate  60 mg Oral Daily  . lamoTRIgine  100 mg Oral BID  . lipase/protease/amylase  48,000 Units Oral TID WC  . potassium chloride SA  20 mEq Oral BID  . pregabalin  100 mg Oral BID  . sertraline  50 mg Oral Daily   Continuous Infusions:  PRN Meds: acetaminophen **OR** acetaminophen, lipase/protease/amylase, metolazone, ondansetron **OR** ondansetron (ZOFRAN) IV  Allergies:    Allergies  Allergen Reactions  . Penicillins Hives    Has patient had a PCN reaction causing immediate rash, facial/tongue/throat swelling, SOB or lightheadedness with hypotension: Yes Has patient had a PCN reaction causing severe rash involving mucus membranes or skin necrosis: No Has patient had a PCN reaction that required hospitalization: No Has patient had a PCN reaction occurring within the last 10 years: No If all of the above answers are "NO", then may proceed with Cephalosporin use.   . Sulfa Antibiotics Hives  . Aspirin Other (See Comments)     On aspirin 81 mg - Rectal bleeding in dec 2018  . Codeine     Headache and makes the patient feel "off"  . Other Rash    Bleach    Social History:   Social History   Socioeconomic History  . Marital status: Divorced    Spouse name: Not on file  . Number of children: Not on file  . Years of education: Not on file  . Highest education level: Not on file  Occupational History  . Occupation: Disabled    Comment: CNA  Social Needs  . Financial resource strain: Not on file  . Food insecurity:    Worry: Not on file    Inability: Not on file  . Transportation needs:    Medical: Not on file    Non-medical: Not on file  Tobacco Use  . Smoking status: Former  Smoker    Packs/day: 0.50    Years: 25.00    Pack years: 12.50    Types: Cigarettes  . Smokeless tobacco: Never Used  . Tobacco comment: quit smoking 20+ytrs ago  Substance and Sexual Activity  . Alcohol use: No  . Drug use: No  . Sexual activity: Never    Birth control/protection: Surgical  Lifestyle  . Physical activity:    Days per week: Not on file    Minutes per session: Not on file  . Stress: Not on file  Relationships  . Social connections:    Talks on phone: Not on file    Gets together: Not on file    Attends religious service: Not on file    Active member of club or organization: Not on file    Attends meetings of clubs or organizations: Not on file    Relationship status: Not on file  . Intimate partner violence:    Fear of current or ex partner: Not on file    Emotionally abused: Not on file    Physically abused: Not on file    Forced sexual activity: Not on file  Other Topics Concern  . Not on file  Social History Narrative   She lives in [place]. She has lots of family in the area and is accompanied by her sister.   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   She  has a distant history of smoking, quit 20 years ago.    Family History:    Family History  Problem Relation Age of Onset  . Allergies Father   . Heart disease Father        before 96  . Heart failure Father   . Hypertension Father   . Hyperlipidemia Father   . Heart disease Mother   . Diabetes Mother   . Hypertension Mother   . Hyperlipidemia Mother   . Hypertension Sister   . Heart disease Sister        before 25  . Hyperlipidemia Sister   . Diabetes Brother   . Hypertension Brother   . Diabetes Sister   . Hypertension Sister   . Diabetes Son   . Hypertension Son   . Cancer Other      ROS:  Please see the history of present illness.   All other ROS reviewed and negative.     Physical Exam/Data:   Vitals:   05/15/18 0215 05/15/18 0300  05/15/18 0350 05/15/18 0700  BP: 114/65 126/70 131/77 128/69  Pulse: (!) 56 (!) 59 (!) 59   Resp: 13 15 16 20   Temp:   97.8 F (36.6 C) 98.5 F (36.9 C)  TempSrc:   Oral Oral  SpO2: 97% 99% 98% 98%  Weight:   115.3 kg   Height:   5' 1"  (1.549 m)    No intake or output data in the 24 hours ending 05/15/18 0739 Filed Weights   05/15/18 0350  Weight: 115.3 kg   Body mass index is 48.03 kg/m.  General:  Well nourished, well developed, morbidly obese AAF laying in bed in no acute distress HEENT: sclera anicteric  Neck: obese, difficult to assess JVD Endocrine:  No thryomegaly Vascular: No carotid bruits; distal pulses 2+ bilaterally Cardiac:  normal S1, S2; RRR; no murmurs, rubs, or gallops appreciated Lungs:  Quiet lung sounds due to body habitus but no obvious wheezing, rhonchi, or rales  Abd: NABS, soft, obese, nontender, no hepatomegaly Ext: trace-1+ edema Musculoskeletal:  No deformities, BUE and BLE strength normal and equal Skin: warm and dry  Neuro:  CNs 2-12 intact, no focal abnormalities noted Psych:  Normal affect   EKG:  The EKG was personally reviewed and demonstrates:  sinus rhythm with isolated TWI in aVL, otherwise no STE/D; no significant change from previous. Telemetry:  Telemetry was personally reviewed and demonstrates:  Sinus bradycardia  Relevant CV Studies: Left heart catheterization 2009: CORONARY ANATOMY:  The left main coronary artery was unremarkable.   Left anterior descending coronary artery.  The left anterior descending  coronary artery and its diagonal branches were unremarkable.   Left circumflex coronary artery.  The left circumflex coronary artery  was dominant and was essentially unremarkable and had a small posterior  descending coronary artery, the distal half of the posterior septum was  supplied by left anterior descending coronary artery.  Right coronary artery.  The right coronary artery was small and  unremarkable.   LEFT  VENTRICULOGRAM:  The left ventriculogram was normal with ejection  fraction of 65%.   IMPRESSION:  1. Normal coronaries.  2. Normal left ventricular systolic function.   RECOMMENDATIONS:  This patient will be started on beta-blocker for her  possible vasovagal syncope.   NST 01/2018:  The left ventricular ejection fraction is normal (55-65%).  Nuclear stress EF: 58%.  There was no ST segment deviation noted during stress.  The study is normal.  This  is a low risk study.   Normal stress nuclear study with no ischemia or infarction.  Gated ejection fraction 58% with normal wall motion.  Note significant right ventricular enlargement.  Suggest echocardiogram to further assess.   Echocardiogram 01/2018: Study Conclusions  - Left ventricle: The cavity size was normal. Systolic function was   normal. The estimated ejection fraction was in the range of 55%   to 60%. Wall motion was normal; there were no regional wall   motion abnormalities. Features are consistent with a pseudonormal   left ventricular filling pattern, with concomitant abnormal   relaxation and increased filling pressure (grade 2 diastolic   dysfunction). - Ventricular septum: The contour showed systolic flattening. These   changes are consistent with RV pressure overload. - Left atrium: The atrium was severely dilated. - Right ventricle: The cavity size was moderately dilated. Wall   thickness was normal. - Right atrium: The atrium was severely dilated. - Atrial septum: No defect or patent foramen ovale was identified. - Pulmonary arteries: Systolic pressure was moderately increased.   PA peak pressure: 67 mm Hg (S).   Laboratory Data:  Chemistry Recent Labs  Lab 05/15/18 0118 05/15/18 0600  NA 136  --   K 5.1  --   CL 102  --   CO2 27  --   GLUCOSE 201*  --   BUN 13  --   CREATININE 0.92 0.96  CALCIUM 9.1  --   GFRNONAA >60 59*  GFRAA >60 >60  ANIONGAP 7  --     Recent Labs  Lab  05/15/18 0600  PROT 7.0  ALBUMIN 3.3*  AST 14*  ALT 12  ALKPHOS 81  BILITOT 0.5   Hematology Recent Labs  Lab 05/15/18 0118  WBC 4.6  RBC 5.12*  HGB 11.8*  HCT 39.3  MCV 76.8*  MCH 23.0*  MCHC 30.0  RDW 16.0*  PLT 226   Cardiac Enzymes Recent Labs  Lab 05/15/18 0600  TROPONINI <0.03    Recent Labs  Lab 05/15/18 0130  TROPIPOC 0.00    BNP Recent Labs  Lab 05/15/18 0118  BNP 153.9*    DDimer  Recent Labs  Lab 05/15/18 0600  DDIMER 0.81*    Radiology/Studies:  Dg Chest 2 View  Result Date: 05/15/2018 CLINICAL DATA:  Chest pain and shortness of breath EXAM: CHEST - 2 VIEW COMPARISON:  Oct 24, 2017 FINDINGS: Stable cardiomegaly. The hila and mediastinum are unchanged. No pulmonary nodules or masses. No focal infiltrates. IMPRESSION: No active cardiopulmonary disease. Electronically Signed   By: Dorise Bullion III M.D   On: 05/15/2018 01:42    Assessment and Plan:   1. Chest pain: patient presented with substernal chest pain radiating to shoulders/neck. Trops are negative. EKG non-ischemic. She had a  myovue in 01/2018 which was negative for ischemia and remote cath in 2009 which showed normal coronaries. Risk factors for heart disease include HTN, HLD, DM type 2, morbid obesity, and family history.  - Will plan for a coronary CTA to further evaluate coronary anatomy - Continue carvedilol, hydralazine, and imdur  2. HTN: BP stable  - Continue home carvedilol, hydralazine, imdur, and lasix  3. Chronic diastolic CHF: Last echo 09/8248 with EF 55-60% and G2DD. Weight 254lbs this admission (251lbs at last visit with Dr. Ellyn Hack 03/2018). BNP mildly elevated to 153. No volume overload complaints and she appears euvolemic on exam - Continue lasix (takes prn metolazone for weight >269lbs - has not needed for quite some  time) - Continue carvedilol, hydralazine, and imdur  4. HLD: no recent lipids - Will add on FLP to AM labs  - Continue statin  5. DM type 2:  HgbA1C 6.9 03/2018; at goal of <7 - Continue management per primary team  6. OSA: recent sleep study 03/2018 suggestive of OSA. She has not started using a CPAP yet  - Encouraged her to follow-up with sleep doctor to coordinate getting a CPAP     For questions or updates, please contact [place] Please consult [email or web address] for contact info under Cardiology/STEMI.   Signed, Abigail Butts, PA-C  05/15/2018 7:39 AM [phone]  PT seen and examined   Pt is a 72 yo with histroy of OSA (untreated), HL, HTN and DM   Presents with CP  CP occurred at rest   Resolved after Rx    Currently symptom free    She had remote cath (normal)   Myvouve in 2019 negative for ischyemia  On exam: Pt comfortable in bed   No CP  Neck:   JVP nromal  No bruits Lungs are CTA Cadiac RRR   No S3   No murmurs  Ext with trivial edema  I am not convinced the episode she had was angina but I cannot explain by anything else   No reflux   Does not appear to be chest wall  Pain   Not pleuritic  I would recomm Coronary CT to define anatomy     Volume status is OK Follow blood pressure  COntnue statn  Dorris Carnes

## 2018-05-15 NOTE — H&P (Addendum)
History and Physical    [name] HRC:[id number] DOB: August 04, 1945 DOA: 05/15/2018  PCP: [name], MD  Patient coming from: Home.  Chief Complaint: Chest pain.  HPI: [name] is a 72 y.o. female with history of diastolic CHF, diabetes mellitus type 2, nonobstructive CAD, chronic anemia, obesity hypoventilation, hyperlipidemia, seizures, chronic pancreatitis presents to the ER after patient started developing chest pain last night at around 11 PM while patient was on her recliner.  Pain was retrosternal sharp radiating to both shoulders and left jaw.  Pain lasted until EMS gave patient sublingual nitroglycerin following which his pain resolved.  Denies any associated shortness of breath fever chills productive cough abdominal pain or nausea vomiting.  ED Course: In the ER patient's EKG showed normal sinus rhythm chest x-ray unremarkable troponin was negative.  Given the risk factors patient is being admitted for further management of chest pain.  Patient has had a stress test in August 2019 about 4 months ago which was low risk study.  Review of Systems: As per HPI, rest all negative.   Past Medical History:  Diagnosis Date  . Abnormal liver function     in the past.  . Anemia   . Arthritis    all over  . Bruises easily   . Cataract    right eye;immature  . Chronic back pain    stenosis  . Chronic cough   . Chronic kidney disease   . COPD (chronic obstructive pulmonary disease) ([place])   . Demand myocardial infarction [facility]) 2012   Demand Infarction in setting of Pancreatitis --> mild Troponin elevation, Non-obstructive CAD  . Depression    takes Abilify daily as well as Zoloft  . Diastolic heart failure 6803   Grade 1 diastolic Dysfunction by Echo   . Diverticulosis   . DM (diabetes mellitus) ([place])    takes Victoza daily as well as Lantus and Humalog  . Empty sella ([place])    on MRI in 2009.  . Glaucoma   . Headache(784.0)    last migraine-4-33yr ago  .  History of blood transfusion    no abnormal reaction noted  . History of colon polyps    benign  . HTN (hypertension)    takes BKinder Morgan Energydaily  . Hyperlipidemia    takes Lipitor daily  . Joint swelling   . Nocturia   . Obstructive sleep apnea   . Pancreatitis    takes Pancrelipase daily  . [place]'s disease [place]    takes Sinemet daily  . Peripheral neuropathy   . Pneumonia 2012  . [place]izures [place]    takes Lamictal daily and Primidone nightly;last seizure 2wks ago  . Urinary urgency    With increased frequency  . Varicose veins of both lower extremities with pain    With edema.  Takes daily Lasix    Past Surgical History:  Procedure Laterality Date  . ABDOMINAL HYSTERECTOMY    . APPENDECTOMY    . BACK SURGERY    . CARDIAC CATHETERIZATION  2009/March 2012   2009: (Dr. KAlvie Heidelberg Nonobstructive CAD; 2012: Minimal CAD --> false positive stress test  . Cardiac Event Monitor  September-October 2017   Sinus rhythm with occasional PACs and artifact. No arrhythmias besides one short run of tachycardia.  . CHOLECYSTECTOMY    . COLONOSCOPY N/A 08/15/2012   Procedure: COLONOSCOPY;  Surgeon: WArta Silence MD;  Location: [facility];  Service: Endoscopy;  Laterality: N/A;  . COLONOSCOPY WITH PROPOFOL Left 06/17/2017   Procedure:  COLONOSCOPY WITH PROPOFOL;  Surgeon: [name], MD;  Location: [place];  Service: Gastroenterology;  Laterality: Left;  . ESOPHAGOGASTRODUODENOSCOPY    . eye cysts Bilateral   . LASIK    . NM MYOVIEW LTD  01/2016; 01/2018   a)LOW RISK. No ischemia or infarction;; b) EF 55-60%. NO ST changes.  No ischemia or Infarction. NO significant RV enlargement.  LOW RISK.  Marland Kitchen RECTAL POLYPECTOMY    . TONSILLECTOMY    . TRANSTHORACIC ECHOCARDIOGRAM  01/2016; 05/2018   A) Normal LV chamber size with mod LVH pattern. EF 50-55%. Severe LA dilation. Mod RA dilation. PAP elevated at 38 mmHg;; B) Mild concentric LVH.  EF 55-60% & no RWMA.  Grade 2 DD.   Diastolic flattening of the ventricular septum == ? elevated PAP.  Mild aortic valve calcification/sclerosis.  Mod LA dilation.  Mild RV dilation & Mod RA dilation  . TRANSTHORACIC ECHOCARDIOGRAM  10/2017; 01/2018   a) EF 55-60%.  No RWMA,  Moderate LA dilation.  Mild LA dilation.  Peak PA pressures ~57 mmHg (moderate);;; b) EF 55-60%.  GRII DD.  Suggestion of RV volume overload with moderate RV dilation.  Severe LA and RA dilation.Marland Kitchen  PA pressure ~67%.  . TUBAL LIGATION    . vaginal cyst removed     several times     reports that she has quit smoking. Her smoking use included cigarettes. She has a 12.50 pack-year smoking history. She has never used smokeless tobacco. She reports that she does not drink alcohol or use drugs.  Allergies  Allergen Reactions  . Penicillins Hives    Has patient had a PCN reaction causing immediate rash, facial/tongue/throat swelling, SOB or lightheadedness with hypotension: Yes Has patient had a PCN reaction causing severe rash involving mucus membranes or skin necrosis: No Has patient had a PCN reaction that required hospitalization: No Has patient had a PCN reaction occurring within the last 10 years: No If all of the above answers are "NO", then may proceed with Cephalosporin use.   . Sulfa Antibiotics Hives  . Aspirin Other (See Comments)     On aspirin 81 mg - Rectal bleeding in dec 2018  . Codeine     Headache and makes the patient feel "off"  . Other Rash    Bleach    Family History  Problem Relation Age of Onset  . Allergies Father   . Heart disease Father        before 61  . Heart failure Father   . Hypertension Father   . Hyperlipidemia Father   . Heart disease Mother   . Diabetes Mother   . Hypertension Mother   . Hyperlipidemia Mother   . Hypertension Sister   . Heart disease Sister        before 20  . Hyperlipidemia Sister   . Diabetes Brother   . Hypertension Brother   . Diabetes Sister   . Hypertension Sister   . Diabetes Son    . Hypertension Son   . Cancer Other     Prior to Admission medications   Medication Sig Start Date End Date Taking? Authorizing Provider  acetaminophen (TYLENOL) 500 MG tablet Take 500 mg by mouth every 4 (four) hours as needed (for pain).    Yes [provider]  atorvastatin (LIPITOR) 10 MG tablet Take 1 tablet (10 mg total) by mouth daily. 11/06/17  Yes Medina-Vargas, Monina C, NP  B Complex-C (B-COMPLEX WITH VITAMIN C) tablet Take 1 tablet by  mouth daily.   Yes [provider]  Blood Glucose Monitoring Suppl (ACCU-CHEK NANO SMARTVIEW) W/DEVICE KIT Use to check blood sugar 3 times per day dx code E11.65 07/19/14  Yes Elayne Snare, MD  carvedilol (COREG) 25 MG tablet TAKE 1 TABLET BY MOUTH TWICE DAILY Patient taking differently: Take 25 mg by mouth 2 (two) times daily with a meal.  03/10/18  Yes Leonie Man, MD  Cholecalciferol (VITAMIN D-3) 1000 units CAPS Take 1,000 Units by mouth daily.   Yes [provider]  diclofenac sodium (VOLTAREN) 1 % GEL Apply 2 g topically 4 (four) times daily. Patient taking differently: Apply 2 g topically 4 (four) times daily as needed (pain).  11/07/17  Yes Medina-Vargas, Monina C, NP  DROPLET PEN NEEDLES 31G X 8 MM MISC USE DAILY WITH VICTOZA AND/OR NOVOLOG.  04/29/18  Yes [name], MD  furosemide (LASIX) 80 MG tablet Take 0.5-1 tablets (40-80 mg total) by mouth daily. Take 80 mg M-W-F, take 40 mg all other days Patient taking differently: Take 40-80 mg by mouth See admin instructions. Take 80 mg M--F, take 40 mg all other days 11/06/17  Yes Medina-Vargas, Monina C, NP  hydrALAZINE (APRESOLINE) 25 MG tablet Take 1 tablet (25 mg total) by mouth 3 (three) times daily. 02/27/18 05/28/18 Yes Leonie Man, MD  lamoTRIgine (LAMICTAL) 100 MG tablet Take 1 tablet (100 mg total) by mouth 2 (two) times daily. 12/09/17  Yes Cameron Sprang, MD  Menthol, Topical Analgesic, 3.5 % GEL Apply 1 application topically as needed (for neuropathy in  feet). 11/07/17  Yes Medina-Vargas, Monina C, NP  metolazone (ZAROXOLYN) 2.5 MG tablet Take 1 tablet (2.5 mg total) by mouth See admin instructions. Take 1 tablet Monday and Friday only if weight is greater or equal to 269 11/07/17  Yes Medina-Vargas, Monina C, NP  Multiple Vitamins-Minerals (MULTIVITAMIN WITH MINERALS) tablet Take 1 tablet by mouth daily.   Yes [provider]  OZEMPIC, 1 MG/DOSE, 2 MG/1.5ML SOPN INJECT (1 MG) SUBCUTANEOUSLY EVERY WEEK ON THE SAME DAY OF EACH WEEK, IN THE ABDOMEN, THIGHS, OR UPPER ARM ROTATING INJECTION SITES Patient taking differently: Inject 1 mg into the skin once a week.  04/08/18  Yes [name], MD  Pancrelipase, Lip-Prot-Amyl, (ZENPEP) 25000-79000 units CPEP Take 1-2 capsules by mouth See admin instructions. Take 2 capsules with meals and 1 capsule with snacks   Yes [provider]  potassium chloride SA (K-DUR,KLOR-CON) 20 MEQ tablet Take 2 tablets by mouth daily. On the day you take metolazone, take extra tablet for a total of 3 (20mq) 01/31/18  Yes HLeonie Man MD  pregabalin (LYRICA) 100 MG capsule Take 1 capsule (100 mg total) by mouth 2 (two) times daily. 11/07/17  Yes Medina-Vargas, Monina C, NP  sertraline (ZOLOFT) 50 MG tablet TAKE 1 TABLET BY MOUTH ONCE DAILY Patient taking differently: Take 50 mg by mouth daily.  04/14/18  Yes SGlendale Chard MD  Insulin Glargine (TOUJEO MAX SOLOSTAR) 300 UNIT/ML SOPN Inject 30 Units into the skin at bedtime. Patient not taking: Reported on 05/15/2018 11/06/17   Medina-Vargas, Monina C, NP  isosorbide mononitrate (IMDUR) 60 MG 24 hr tablet Take 1 tablet (60 mg total) by mouth daily. 11/19/17 04/02/18  HLeonie Man MD    Physical Exam: Vitals:   05/15/18 0117 05/15/18 0215 05/15/18 0300 05/15/18 0350  BP:  114/65 126/70 131/77  Pulse:  (!) 56 (!) 59 (!) 59  Resp:  _0 Temp:  97.8 F (36.6 C)  TempSrc:    Oral  SpO2:  97% 99% 98%  Weight:    115.3 kg  Height: 5' 1" (1.549 m)    5' 1" (1.549 m)      Constitutional: Moderately built and nourished. Vitals:   05/15/18 0117 05/15/18 0215 05/15/18 0300 05/15/18 0350  BP:  114/65 126/70 131/77  Pulse:  (!) 56 (!) 59 (!) 59  Resp:  _0 Temp:    97.8 F (36.6 C)  TempSrc:    Oral  SpO2:  97% 99% 98%  Weight:    115.3 kg  Height: 5' 1" (1.549 m)   5' 1" (1.549 m)   Eyes: Anicteric no pallor. ENMT: No discharge from the ears eyes nose or mouth. Neck: No mass felt.  No neck rigidity but no JVD appreciated. Respiratory: No rhonchi or crepitations. Cardiovascular: S1-S2 heard. Abdomen: Soft nontender bowel sounds present. Musculoskeletal: Mild edema both lower extremities. Skin: No rash. Neurologic: Alert awake oriented to time place and person.  Moves all extremities. Psychiatric: Appears normal per normal affect.   Labs on Admission: I have personally reviewed following labs and imaging studies  CBC: Recent Labs  Lab 05/15/18 0118  WBC 4.6  HGB 11.8*  HCT 39.3  MCV 76.8*  PLT 846   Basic Metabolic Panel: Recent Labs  Lab 05/15/18 0118  NA 136  K 5.1  CL 102  CO2 27  GLUCOSE 201*  BUN 13  CREATININE 0.92  CALCIUM 9.1   GFR: Estimated Creatinine Clearance: 67.2 mL/min (by C-G formula based on SCr of 0.92 mg/dL). Liver Function Tests: No results for input(s): AST, ALT, ALKPHOS, BILITOT, PROT, ALBUMIN in the last 168 hours. No results for input(s): LIPASE, AMYLASE in the last 168 hours. No results for input(s): AMMONIA in the last 168 hours. Coagulation Profile: No results for input(s): INR, PROTIME in the last 168 hours. Cardiac Enzymes: No results for input(s): CKTOTAL, CKMB, CKMBINDEX, TROPONINI in the last 168 hours. BNP (last 3 results) No results for input(s): PROBNP in the last 8760 hours. HbA1C: No results for input(s): HGBA1C in the last 72 hours. CBG: No results for input(s): GLUCAP in the last 168 hours. Lipid Profile: No results for input(s): CHOL, HDL, LDLCALC,  TRIG, CHOLHDL, LDLDIRECT in the last 72 hours. Thyroid Function Tests: No results for input(s): TSH, T4TOTAL, FREET4, T3FREE, THYROIDAB in the last 72 hours. Anemia Panel: No results for input(s): VITAMINB12, FOLATE, FERRITIN, TIBC, IRON, RETICCTPCT in the last 72 hours. Urine analysis:    Component Value Date/Time   COLORURINE YELLOW 06/23/2017 2047   APPEARANCEUR CLEAR 06/23/2017 2047   LABSPEC 1.018 06/23/2017 2047   PHURINE 6.0 06/23/2017 2047   GLUCOSEU >=500 (A) 06/23/2017 2047   GLUCOSEU NEGATIVE 04/17/2016 0955   HGBUR NEGATIVE 06/23/2017 2047   BILIRUBINUR NEGATIVE 06/23/2017 2047   [place] 06/23/2017 2047   PROTEINUR NEGATIVE 06/23/2017 2047   UROBILINOGEN 0.2 04/17/2016 0955   NITRITE NEGATIVE 06/23/2017 2047   LEUKOCYTESUR NEGATIVE 06/23/2017 2047   Sepsis Labs: _1 (procalcitonin:4,lacticidven:4) )No results found for this or any previous visit (from the past 240 hour(s)).   Radiological Exams on Admission: Dg Chest 2 View  Result Date: 05/15/2018 CLINICAL DATA:  Chest pain and shortness of breath EXAM: CHEST - 2 VIEW COMPARISON:  Oct 24, 2017 FINDINGS: Stable cardiomegaly. The hila and mediastinum are unchanged. No pulmonary nodules or masses. No focal infiltrates. IMPRESSION: No active cardiopulmonary disease. Electronically Signed   By: Shanon Brow  Mee Hives M.D   On: 05/15/2018 01:42    EKG: Independently reviewed.  Normal sinus rhythm.  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 Chest pain -given the history of diabetes CHF nonobstructive  CAD we will cycle cardiac markers to rule out ACS.  Presently chest pain-free after sublingual nitroglycerin.  Has had low risk stress test study in August 2019.  Patient is on aspirin Coreg statin and Imdur.  LFTs and lipase are pending.  Cardiology consult requested. 2. Diabetes mellitus type 2 on Toujeo takes 28 units at bedtime.  We will keep patient on sliding scale coverage. 3. History of chronic pancreatitis on enzyme replacement.  LFTs and lipase are pending. 4. Hypertension on Coreg hydralazine and Imdur. 5. Chronic anemia follow CBC. 6. Chronic diastolic CHF last EF measured was 55 to 60% with grade 2 diastolic dysfunction and right heart failure on Lasix. 7. Morbid obesity with possible sleep apnea. 8. History of seizures on Lamictal.   DVT prophylaxis: Lovenox. Code Status: Full code. Family Communication: Discussed with patient. Disposition Plan: Home. Consults called: Cardiology. Admission status: Observation.   Rise Patience MD [facility] Pager [phone].  If 7PM-7AM, please contact night-coverage [email or web address] Password [email or web address]  05/15/2018, 5:00 AM

## 2018-05-15 NOTE — Care Management Obs Status (Signed)
[place] NOTIFICATION   Patient Details  Name: ADELIS DOCTER MRN: [id number] Date of Birth: 1946/03/01   Medicare Observation Status Notification Given:  Yes    Midge Minium RN, BSN, NCM-BC, ACM-RN [phone] 05/15/2018, 4:11 PM

## 2018-05-15 NOTE — Progress Notes (Signed)
Pt transported to CT ?

## 2018-05-15 NOTE — ED Triage Notes (Signed)
  Patient BIB EMS for chest pain and SOB that started earlier this evening.  Patient has hx of MI, and CHF.  Patient states it started around 2300 last night and started having pressure in her chest and pain radiating to her L jaw.  Patient was given 324 ASA and 2 nitro tabs via EMS.  NSR on monitor.  Patient A&O x 4.  Pain relieved by nitro, went from 9/10 to 7/10.

## 2018-05-15 NOTE — ED Provider Notes (Signed)
[facility] EMERGENCY DEPARTMENT Provider Note   CSN: [id number] Arrival date & time: 05/15/18  0107     History   Chief Complaint Chief Complaint  Patient presents with  . Chest Pain  . Shortness of Breath    HPI [name] is a 72 y.o. female.  Patient with a history of CAD (2009 cath shows nonobstructive disease, demain MI 2012 in the setting pancreatitis), COPD, CKD, HTN, OSA, HLD, CHF (diastolic) presents for evaluation of chest pain that started while sitting up in bed watching TV around 11:30 pm last evening (05/14/18). The discomfort went away after a few minutes and returned. She reports mild, brief nausea, mild SOB, no diaphoresis. The pain radiated to her jaw and is now in her shoulders bilaterally. No chest pain now. She was given nitro by EMS with report of resolution of her pain. No cough, fever, vomiting. She cannot say whether there were aggravating or alleviating factors prior to EMS arrival.    Chest Pain   Associated symptoms include nausea and shortness of breath. Pertinent negatives include no fever and no vomiting.  Shortness of Breath  Associated symptoms include chest pain. Pertinent negatives include no fever and no vomiting.    Past Medical History:  Diagnosis Date  . Abnormal liver function     in the past.  . Anemia   . Arthritis    all over  . Bruises easily   . Cataract    right eye;immature  . Chronic back pain    stenosis  . Chronic cough   . Chronic kidney disease   . COPD (chronic obstructive pulmonary disease) ([place])   . Demand myocardial infarction [facility]) 2012   Demand Infarction in setting of Pancreatitis --> mild Troponin elevation, Non-obstructive CAD  . Depression    takes Abilify daily as well as Zoloft  . Diastolic heart failure 2919   Grade 1 diastolic Dysfunction by Echo   . Diverticulosis   . DM (diabetes mellitus) ([place])    takes Victoza daily as well as Lantus and Humalog  . Empty sella ([place])    on MRI in 2009.  . Glaucoma   . Headache(784.0)    last migraine-4-60yr ago  . History of blood transfusion    no abnormal reaction noted  . History of colon polyps    benign  . HTN (hypertension)    takes BK[employer] Morgan Energydaily  . Hyperlipidemia    takes Lipitor daily  . Joint swelling   . Nocturia   . Obstructive sleep apnea   . Pancreatitis    takes Pancrelipase daily  . Parkinson's disease ([place]    takes Sinemet daily  . Peripheral neuropathy   . Pneumonia 2012  . Seizures ([place]    takes Lamictal daily and Primidone nightly;last seizure 2wks ago  . Urinary urgency    With increased frequency  . Varicose veins of both lower extremities with pain    With edema.  Takes daily Lasix    Patient Active Problem List   Diagnosis Date Noted  . 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 Excessive daytime sleepiness 01/30/2018  . Sleep-related hypoventilation 01/30/2018  . COPD (chronic obstructive pulmonary disease) ([place] 11/06/2017  . Depression 11/06/2017  . OSA (obstructive sleep apnea) - not on CPAP 10/07/2017  . Todd's paralysis ([place]   . Chronic diastolic CHF (congestive heart failure), NYHA class 2 ([place]   . Acute on chronic  respiratory failure with hypoxia ([place])   . Acute lower GI bleeding   . Stroke-like symptoms 06/15/2017  . Rectal bleeding 06/15/2017  . Dehydration 03/07/2017  . Medication management 12/24/2016  . Pre-syncope 03/09/2016  . Racing heart beat 03/09/2016  . Spinal stenosis at L4-L5 level 10/04/2014  . Lower abdominal pain 10/31/2012  . Type II diabetes mellitus, uncontrolled ([place]) 10/31/2012  . Seizure disorder ([place]) 10/31/2012  . Diverticulosis 10/31/2012  . Anemia associated with acute blood loss 08/14/2012  . Hematochezia 08/14/2012  . Varicose veins of bilateral lower extremities with other complications 81/44/8185  . Leg pain 03/21/2012  . Radicular pain of left lower  extremity 03/21/2012  . Infected pseudocyst of pancreas 10/28/2011  . Chronic pancreatitis ([place]) 10/28/2011  . Hypertensive heart disease with chronic diastolic congestive heart failure ([place]) 10/28/2011  . Dyslipidemia, goal LDL below 100 10/28/2011  . Morbid obesity with BMI of 50.0-59.9, adult ([place]) 10/28/2011  . Peripheral edema 04/14/2011  . Obesity hypoventilation syndrome ([place]) 10/26/2010  . Coronary artery disease, non-occlusive 08/28/2010    Past Surgical History:  Procedure Laterality Date  . ABDOMINAL HYSTERECTOMY    . APPENDECTOMY    . BACK SURGERY    . CARDIAC CATHETERIZATION  2009/March 2012   2009: (Dr. Alvie Heidelberg) Nonobstructive CAD; 2012: Minimal CAD --> false positive stress test  . Cardiac Event Monitor  September-October 2017   Sinus rhythm with occasional PACs and artifact. No arrhythmias besides one short run of tachycardia.  . CHOLECYSTECTOMY    . COLONOSCOPY N/A 08/15/2012   Procedure: COLONOSCOPY;  Surgeon: Arta Silence, MD;  Location: [facility];  Service: Endoscopy;  Laterality: N/A;  . COLONOSCOPY WITH PROPOFOL Left 06/17/2017   Procedure: COLONOSCOPY WITH PROPOFOL;  Surgeon: [name], MD;  Location: [facility];  Service: Gastroenterology;  Laterality: Left;  . ESOPHAGOGASTRODUODENOSCOPY    . eye cysts Bilateral   . LASIK    . NM MYOVIEW LTD  01/2016; 01/2018   a)LOW RISK. No ischemia or infarction;; b) EF 55-60%. NO ST changes.  No ischemia or Infarction. NO significant RV enlargement.  LOW RISK.  Marland Kitchen RECTAL POLYPECTOMY    . TONSILLECTOMY    . TRANSTHORACIC ECHOCARDIOGRAM  01/2016; 05/2018   A) Normal LV chamber size with mod LVH pattern. EF 50-55%. Severe LA dilation. Mod RA dilation. PAP elevated at 38 mmHg;; B) Mild concentric LVH.  EF 55-60% & no RWMA.  Grade 2 DD.  Diastolic flattening of the ventricular septum == ? elevated PAP.  Mild aortic valve calcification/sclerosis.  Mod LA dilation.  Mild RV dilation & Mod RA dilation  . TRANSTHORACIC  ECHOCARDIOGRAM  10/2017; 01/2018   a) EF 55-60%.  No RWMA,  Moderate LA dilation.  Mild LA dilation.  Peak PA pressures ~57 mmHg (moderate);;; b) EF 55-60%.  GRII DD.  Suggestion of RV volume overload with moderate RV dilation.  Severe LA and RA dilation.Marland Kitchen  PA pressure ~67%.  . TUBAL LIGATION    . vaginal cyst removed     several times     OB History   None      Home Medications    Prior to Admission medications   Medication Sig Start Date End Date Taking? Authorizing Provider  acetaminophen (TYLENOL) 500 MG tablet Take 500 mg by mouth every 4 (four) hours as needed (for pain).     [provider]  atorvastatin (LIPITOR) 10 MG tablet Take 1 tablet (10 mg total) by mouth daily. 11/06/17   Medina-Vargas, Senaida Lange, NP  B Complex-C (B-COMPLEX WITH VITAMIN C) tablet Take 1 tablet by mouth daily.    [provider]  Blood Glucose Monitoring Suppl (ACCU-CHEK NANO SMARTVIEW) W/DEVICE KIT Use to check blood sugar 3 times per day dx code E11.65 07/19/14   Elayne Snare, MD  carvedilol (COREG) 25 MG tablet TAKE 1 TABLET BY MOUTH TWICE DAILY Patient taking differently: 3 (three) times daily.  03/10/18   Leonie Man, MD  Cholecalciferol (VITAMIN D-3) 1000 units CAPS Take 1,000 Units by mouth daily.    [provider]  diclofenac sodium (VOLTAREN) 1 % GEL Apply 2 g topically 4 (four) times daily. 11/07/17   Medina-Vargas, Monina C, NP  DROPLET PEN NEEDLES 31G X 8 MM MISC USE DAILY WITH VICTOZA AND/OR NOVOLOG.  04/29/18   [name], MD  furosemide (LASIX) 80 MG tablet Take 0.5-1 tablets (40-80 mg total) by mouth daily. Take 80 mg M-W-F, take 40 mg all other days 11/06/17   Medina-Vargas, Monina C, NP  hydrALAZINE (APRESOLINE) 25 MG tablet Take 1 tablet (25 mg total) by mouth 3 (three) times daily. 02/27/18 05/28/18  Leonie Man, MD  Insulin Glargine (TOUJEO MAX SOLOSTAR) 300 UNIT/ML SOPN Inject 30 Units into the skin at bedtime. Patient taking differently: Inject 28 Units  into the skin at bedtime.  11/06/17   Medina-Vargas, Monina C, NP  isosorbide mononitrate (IMDUR) 60 MG 24 hr tablet Take 1 tablet (60 mg total) by mouth daily. 11/19/17 04/02/18  Leonie Man, MD  lamoTRIgine (LAMICTAL) 100 MG tablet Take 1 tablet (100 mg total) by mouth 2 (two) times daily. 12/09/17   Cameron Sprang, MD  Menthol, Topical Analgesic, 3.5 % GEL Apply 1 application topically as needed (for neuropathy in feet). 11/07/17   Medina-Vargas, Monina C, NP  metolazone (ZAROXOLYN) 2.5 MG tablet Take 1 tablet (2.5 mg total) by mouth See admin instructions. Take 1 tablet Monday and Friday only if weight is greater or equal to 269 11/07/17   Medina-Vargas, Monina C, NP  Multiple Vitamins-Minerals (MULTIVITAMIN WITH MINERALS) tablet Take 1 tablet by mouth daily.    [provider]  OZEMPIC, 1 MG/DOSE, 2 MG/1.5ML SOPN INJECT (1 MG) SUBCUTANEOUSLY EVERY WEEK ON THE SAME DAY OF EACH WEEK, IN THE ABDOMEN, THIGHS, OR UPPER ARM ROTATING INJECTION SITES 04/08/18   [name], MD  potassium chloride SA (K-DUR,KLOR-CON) 20 MEQ tablet Take 2 tablets by mouth daily. On the day you take metolazone, take extra tablet for a total of 3 (44mq) 01/31/18   HLeonie Man MD  pregabalin (LYRICA) 100 MG capsule Take 1 capsule (100 mg total) by mouth 2 (two) times daily. 11/07/17   Medina-Vargas, Monina C, NP  Semaglutide (OZEMPIC) 0.25 or 0.5 MG/DOSE SOPN Inject 0.5 mg into the skin once a week.    [provider]  sertraline (ZOLOFT) 50 MG tablet TAKE 1 TABLET BY MOUTH ONCE DAILY 04/14/18   SGlendale Chard MD    Family History Family History  Problem Relation Age of Onset  . Allergies Father   . Heart disease Father        before 665 . Heart failure Father   . Hypertension Father   . Hyperlipidemia Father   . Heart disease Mother   . Diabetes Mother   . Hypertension Mother   . Hyperlipidemia Mother   . Hypertension Sister   . Heart disease Sister        before 679 . Hyperlipidemia  Sister   . Diabetes  Brother   . Hypertension Brother   . Diabetes Sister   . Hypertension Sister   . Diabetes Son   . Hypertension Son   . Cancer Other     Social History Social History   Tobacco Use  . Smoking status: Former Smoker    Packs/day: 0.50    Years: 25.00    Pack years: 12.50    Types: Cigarettes  . Smokeless tobacco: Never Used  . Tobacco comment: quit smoking 20+ytrs ago  Substance Use Topics  . Alcohol use: No  . Drug use: No     Allergies   Penicillins; Sulfa antibiotics; Aspirin; Codeine; and Other   Review of Systems Review of Systems  Constitutional: Negative for chills and fever.  HENT: Negative.   Respiratory: Positive for shortness of breath.   Cardiovascular: Positive for chest pain.  Gastrointestinal: Positive for nausea. Negative for vomiting.  Musculoskeletal: Negative.   Skin: Negative.   Neurological: Negative.      Physical Exam Updated Vital Signs BP (!) 158/75 (BP Location: Right Arm)   Pulse 61   Temp 98.4 F (36.9 C) (Oral)   Resp 16   Ht '5\' 1"'$  (1.549 m)   SpO2 100%   BMI 48.33 kg/m   Physical Exam  Constitutional: She is oriented to person, place, and time. She appears well-developed and well-nourished.  HENT:  Head: Normocephalic.  Eyes: Conjunctivae are normal.  Neck: Normal range of motion. Neck supple.  Cardiovascular: Normal rate, regular rhythm and normal pulses.  Pulmonary/Chest: Effort normal and breath sounds normal. She has no wheezes. She has no rales. She exhibits no tenderness.  Abdominal: Soft. Bowel sounds are normal. There is no tenderness. There is no rebound and no guarding.  Musculoskeletal: Normal range of motion. She exhibits no edema.  Neurological: She is alert and oriented to person, place, and time.  Skin: Skin is warm and dry. No rash noted.  Psychiatric: She has a normal mood and affect.     ED Treatments / Results  Labs (all labs ordered are listed, but only abnormal results are  displayed) Labs Reviewed  BASIC METABOLIC PANEL  CBC  I-STAT TROPONIN, ED    EKG EKG Interpretation  Date/Time:  Thursday May 15 2018 01:13:07 EST Ventricular Rate:  63 PR Interval:    QRS Duration: 94 QT Interval:  428 QTC Calculation: 439 R Axis:   107 Text Interpretation:  Sinus rhythm Borderline prolonged PR interval Right axis deviation When compared with ECG of 10/25/2017, No significant change was found Confirmed by Delora Fuel ([id number]) on 05/15/2018 1:20:05 AM   Radiology Dg Chest 2 View  Result Date: 05/15/2018 CLINICAL DATA:  Chest pain and shortness of breath EXAM: CHEST - 2 VIEW COMPARISON:  Oct 24, 2017 FINDINGS: Stable cardiomegaly. The hila and mediastinum are unchanged. No pulmonary nodules or masses. No focal infiltrates. IMPRESSION: No active cardiopulmonary disease. Electronically Signed   By: Dorise Bullion III M.D   On: 05/15/2018 01:42    Procedures Procedures (including critical care time)  Medications Ordered in ED Medications - No data to display   Initial Impression / Assessment and Plan / ED Course  I have reviewed the triage vital signs and the nursing notes.  Pertinent labs & imaging results that were available during my care of the patient were reviewed by me and considered in my medical decision making (see chart for details).     Patient with complicated medical history presents with at rest chest pain associated with  mild SOB, nausea, radiating to jaw and settling in shoulders. EMS reports her pain subsided after she was given nitro x 2. Here she is pain free and comfortable.   Chart reviewed. Cath in 2012 showed nonobstructive CAD. She is currently followed by Dr. Ellyn Hack with last office evaluation in August for stress myoview and in October for test results. No recent cath.   The patient is felt to have significant risk factors for MI. Initial EKG and troponin are negative for indication of ischemia.Admission to rule out ischemia is  felt to be beneficial given her presentation.   Discussed with Dr. Hal Hope, [facility], who accepts the patient for admission.   Final Clinical Impressions(s) / ED Diagnoses   Final diagnoses:  None   1. Chest pain  ED Discharge Orders    None       Charlann Lange, Hershal Coria 76/16/07 [id number]    Delora Fuel, MD 62/69/48 [phone]

## 2018-05-15 NOTE — Progress Notes (Signed)
Patient admitted after midnight, please see H&P.  Here with chest pain.  Plan is for CTA coronaries.  Will need close monitoring of her blood sugars while NPO awaiting scan.  Eulogio Bear DO

## 2018-05-16 ENCOUNTER — Telehealth: Payer: Self-pay | Admitting: Cardiology

## 2018-05-16 ENCOUNTER — Observation Stay (HOSPITAL_COMMUNITY): Payer: Medicare HMO

## 2018-05-16 ENCOUNTER — Encounter (HOSPITAL_COMMUNITY)
Admission: EM | Disposition: A | Discharge status: Home / Self Care | Payer: Self-pay | Source: Home / Self Care | Attending: Emergency Medicine

## 2018-05-16 DIAGNOSIS — I25118 Atherosclerotic heart disease of native coronary artery with other forms of angina pectoris: Secondary | ICD-10-CM

## 2018-05-16 DIAGNOSIS — R9430 Abnormal result of cardiovascular function study, unspecified: Secondary | ICD-10-CM | POA: Diagnosis not present

## 2018-05-16 HISTORY — PX: LEFT HEART CATH AND CORONARY ANGIOGRAPHY: CATH118249

## 2018-05-16 LAB — GLUCOSE, CAPILLARY
Glucose-Capillary: 121 mg/dL — ABNORMAL HIGH (ref 70–99)
Glucose-Capillary: 139 mg/dL — ABNORMAL HIGH (ref 70–99)
Glucose-Capillary: 141 mg/dL — ABNORMAL HIGH (ref 70–99)
Glucose-Capillary: 199 mg/dL — ABNORMAL HIGH (ref 70–99)
Glucose-Capillary: 56 mg/dL — ABNORMAL LOW (ref 70–99)
Glucose-Capillary: 90 mg/dL (ref 70–99)
Glucose-Capillary: 91 mg/dL (ref 70–99)
Glucose-Capillary: 99 mg/dL (ref 70–99)

## 2018-05-16 LAB — CBC
HCT: 39.1 % (ref 36.0–46.0)
Hemoglobin: 11.3 g/dL — ABNORMAL LOW (ref 12.0–15.0)
MCH: 22.2 pg — ABNORMAL LOW (ref 26.0–34.0)
MCHC: 28.9 g/dL — ABNORMAL LOW (ref 30.0–36.0)
MCV: 76.7 fL — ABNORMAL LOW (ref 80.0–100.0)
Platelets: 228 10*3/uL (ref 150–400)
RBC: 5.1 MIL/uL (ref 3.87–5.11)
RDW: 15.9 % — ABNORMAL HIGH (ref 11.5–15.5)
WBC: 4.1 10*3/uL (ref 4.0–10.5)
nRBC: 0 % (ref 0.0–0.2)

## 2018-05-16 LAB — CREATININE, SERUM
Creatinine, Ser: 0.91 mg/dL (ref 0.44–1.00)
GFR calc Af Amer: >60 mL/min (ref >60)
GFR calc non Af Amer: >60 mL/min (ref >60)

## 2018-05-16 SURGERY — LEFT HEART CATH AND CORONARY ANGIOGRAPHY
Anesthesia: LOCAL

## 2018-05-16 MED ORDER — LIDOCAINE HCL (PF) 1 % IJ SOLN
INTRAMUSCULAR | Status: DC | PRN
  Administered 2018-05-16: 2 mL via SUBCUTANEOUS

## 2018-05-16 MED ORDER — LIDOCAINE HCL (PF) 1 % IJ SOLN
INTRAMUSCULAR | Status: AC
  Filled 2018-05-16: qty 30

## 2018-05-16 MED ORDER — HEPARIN SODIUM (PORCINE) 1000 UNIT/ML IJ SOLN
INTRAMUSCULAR | Status: DC | PRN
  Administered 2018-05-16: 5000 [IU] via INTRAVENOUS

## 2018-05-16 MED ORDER — HEPARIN (PORCINE) IN NACL 1000-0.9 UT/500ML-% IV SOLN
INTRAVENOUS | Status: DC | PRN
  Administered 2018-05-16 (×3): 500 mL

## 2018-05-16 MED ORDER — SODIUM CHLORIDE 0.9% FLUSH: 3.0000 mL | Freq: Two times a day (BID) | INTRAVENOUS | Status: DC

## 2018-05-16 MED ORDER — DEXTROSE 50 % IV SOLN
INTRAVENOUS | Status: AC
  Filled 2018-05-16: qty 50

## 2018-05-16 MED ORDER — SODIUM CHLORIDE 0.9 % IV SOLN
INTRAVENOUS | Status: AC
  Administered 2018-05-16: 18:00:00 via INTRAVENOUS

## 2018-05-16 MED ORDER — PANTOPRAZOLE SODIUM 40 MG PO TBEC
40.0000 mg | DELAYED_RELEASE_TABLET | Freq: Every day | ORAL | Status: DC
  Administered 2018-05-17: 40 mg via ORAL
  Filled 2018-05-16: qty 1

## 2018-05-16 MED ORDER — HEPARIN (PORCINE) IN NACL 1000-0.9 UT/500ML-% IV SOLN
INTRAVENOUS | Status: AC
  Filled 2018-05-16: qty 1000

## 2018-05-16 MED ORDER — MIDAZOLAM HCL 2 MG/2ML IJ SOLN
INTRAMUSCULAR | Status: DC | PRN
  Administered 2018-05-16: 0.5 mg via INTRAVENOUS

## 2018-05-16 MED ORDER — IOHEXOL 350 MG/ML SOLN
INTRAVENOUS | Status: DC | PRN
  Administered 2018-05-16: 90 mL via INTRA_ARTERIAL

## 2018-05-16 MED ORDER — FENTANYL CITRATE (PF) 100 MCG/2ML IJ SOLN
INTRAMUSCULAR | Status: AC
  Filled 2018-05-16: qty 2

## 2018-05-16 MED ORDER — ACETAMINOPHEN 325 MG PO TABS
650.0000 mg | ORAL_TABLET | ORAL | Status: DC | PRN
  Administered 2018-05-17: 650 mg via ORAL
  Filled 2018-05-16: qty 2

## 2018-05-16 MED ORDER — VERAPAMIL HCL 2.5 MG/ML IV SOLN
INTRAVENOUS | Status: DC | PRN
  Administered 2018-05-16: 10 mL via INTRA_ARTERIAL

## 2018-05-16 MED ORDER — DEXTROSE 50 % IV SOLN
1.0000 | Freq: Once | INTRAVENOUS | Status: AC
  Administered 2018-05-16: 25 mL via INTRAVENOUS

## 2018-05-16 MED ORDER — HEPARIN SODIUM (PORCINE) 1000 UNIT/ML IJ SOLN
INTRAMUSCULAR | Status: AC
  Filled 2018-05-16: qty 1

## 2018-05-16 MED ORDER — FENTANYL CITRATE (PF) 100 MCG/2ML IJ SOLN
INTRAMUSCULAR | Status: DC | PRN
  Administered 2018-05-16: 25 ug via INTRAVENOUS

## 2018-05-16 MED ORDER — VERAPAMIL HCL 2.5 MG/ML IV SOLN
INTRAVENOUS | Status: AC
  Filled 2018-05-16: qty 2

## 2018-05-16 MED ORDER — MIDAZOLAM HCL 2 MG/2ML IJ SOLN
INTRAMUSCULAR | Status: AC
  Filled 2018-05-16: qty 2

## 2018-05-16 MED ORDER — SODIUM CHLORIDE 0.9% FLUSH: 3.0000 mL | INTRAVENOUS | Status: DC | PRN

## 2018-05-16 MED ORDER — SODIUM CHLORIDE 0.9 % IV SOLN
INTRAVENOUS | Status: AC | PRN
  Administered 2018-05-16: 20 mL/h via INTRAVENOUS

## 2018-05-16 MED ORDER — SODIUM CHLORIDE 0.9 % IV SOLN: 250.0000 mL | INTRAVENOUS | Status: DC | PRN

## 2018-05-16 MED ORDER — ONDANSETRON HCL 4 MG/2ML IJ SOLN: 4.0000 mg | Freq: Four times a day (QID) | INTRAMUSCULAR | Status: DC | PRN

## 2018-05-16 MED ORDER — ASPIRIN 81 MG PO CHEW: 81.0000 mg | CHEWABLE_TABLET | ORAL | Status: DC

## 2018-05-16 MED ORDER — SODIUM CHLORIDE 0.9 % IV SOLN: INTRAVENOUS | Status: DC

## 2018-05-16 MED ORDER — ENOXAPARIN SODIUM 40 MG/0.4ML ~~LOC~~ SOLN: 40.0000 mg | SUBCUTANEOUS | Status: DC

## 2018-05-16 SURGICAL SUPPLY — 9 items
CATH 5FR JL3.5 JR4 ANG PIG MP (CATHETERS) ×1 IMPLANT
DEVICE RAD COMP TR BAND LRG (VASCULAR PRODUCTS) ×1 IMPLANT
GLIDESHEATH SLEND A-KIT 6F 22G (SHEATH) ×1 IMPLANT
GUIDEWIRE INQWIRE 1.5J.035X260 (WIRE) IMPLANT
INQWIRE 1.5J .035X260CM (WIRE) ×2
KIT HEART LEFT (KITS) ×2 IMPLANT
PACK CARDIAC CATHETERIZATION (CUSTOM PROCEDURE TRAY) ×2 IMPLANT
TRANSDUCER W/STOPCOCK (MISCELLANEOUS) ×2 IMPLANT
TUBING CIL FLEX 10 FLL-RA (TUBING) ×2 IMPLANT

## 2018-05-16 NOTE — Progress Notes (Signed)
3cc of air removed from compression band. 9cc of air remaining  Right radial- level 0.   .BP (!) 148/70 (BP Location: Left Arm)   Pulse (!) 59   Temp 98.4 F (36.9 C) (Oral)   Resp 18   Ht 5\' 1"  (1.549 m)   Wt 113.4 kg   SpO2 93% Comment: R finger   BMI 47.24 kg/m

## 2018-05-16 NOTE — Telephone Encounter (Signed)
05/15/2018 - present (1 day)  [name]

## 2018-05-16 NOTE — Progress Notes (Signed)
   FFR results reported by Dr. Oval Linsey suggests flow limiting lesion in D1/D2. Given chest pain at presentation and mildly elevated troponins, will pursue LHC today for further assessment of coronary anatomy. The patient understands that risks included but are not limited to stroke (1 in 1000), death (1 in 20), kidney failure [usually temporary] (1 in 500), bleeding (1 in 200), allergic reaction [possibly serious] (1 in 200). The patient understands and agrees to proceed.   Abigail Butts, PA-C 05/16/18

## 2018-05-16 NOTE — Progress Notes (Addendum)
Progress Note  Patient Name: [name] Date of Encounter: 05/16/2018  Primary Cardiologist: Glenetta Hew, MD   Subjective   She reports feeling better today. A little tired. No recurrent chest pain. No SOB or palpitations  Inpatient Medications    Scheduled Meds: . atorvastatin  10 mg Oral Daily  . carvedilol  25 mg Oral BID WC  . enoxaparin (LOVENOX) injection  40 mg Subcutaneous Q24H  . [START ON 05/17/2018] furosemide  40 mg Oral Once per day on Sun Sat  . furosemide  80 mg Oral Once per day on Mon Tue Wed Thu Fri  . hydrALAZINE  25 mg Oral TID  . insulin aspart  0-9 Units Subcutaneous Q4H  . isosorbide mononitrate  60 mg Oral Daily  . lamoTRIgine  100 mg Oral BID  . lipase/protease/amylase  48,000 Units Oral TID WC  . pregabalin  100 mg Oral BID  . sertraline  50 mg Oral Daily   Continuous Infusions:  PRN Meds: acetaminophen **OR** acetaminophen, lipase/protease/amylase, metolazone, nitroGLYCERIN, ondansetron **OR** ondansetron (ZOFRAN) IV   Vital Signs    Vitals:   05/15/18 1704 05/15/18 2214 05/16/18 0047 05/16/18 0617  BP: (!) 101/52 128/69 130/63 132/72  Pulse:  61 (!) 58 (!) 56  Resp: 18 20 18 18   Temp: 97.7 F (36.5 C) 97.7 F (36.5 C) 98.5 F (36.9 C) 98.4 F (36.9 C)  TempSrc: Oral Oral Oral Oral  SpO2: 94% 100% 98% 98%  Weight:      Height:        Intake/Output Summary (Last 24 hours) at 05/16/2018 1059 Last data filed at 05/15/2018 2230 Gross per 24 hour  Intake -  Output 400 ml  Net -400 ml   Filed Weights   05/15/18 0350  Weight: 115.3 kg    Telemetry    Sinus rhythm/sinus bradycardia with PACs - Personally Reviewed  Physical Exam   GEN: Laying in bed in no acute distress.   Neck: No JVD, no carotid bruits Cardiac: RRR, no murmurs, rubs, or gallops.  Respiratory: Clear to auscultation bilaterally, no wheezes/ rales/ rhonchi GI: NABS, Soft, obese, nontender, non-distended  MS: No edema; No deformity. Neuro:  Nonfocal,  moving all extremities spontaneously Psych: Normal affect   Labs    Chemistry Recent Labs  Lab 05/15/18 0118 05/15/18 0600  NA 136  --   K 5.1  --   CL 102  --   CO2 27  --   GLUCOSE 201*  --   BUN 13  --   CREATININE 0.92 0.96  CALCIUM 9.1  --   PROT  --  7.0  ALBUMIN  --  3.3*  AST  --  14*  ALT  --  12  ALKPHOS  --  81  BILITOT  --  0.5  GFRNONAA >60 59*  GFRAA >60 >60  ANIONGAP 7  --      Hematology Recent Labs  Lab 05/15/18 0118 05/15/18 0600  WBC 4.6 4.3  RBC 5.12* 4.75  HGB 11.8* 10.6*  HCT 39.3 36.7  MCV 76.8* 77.3*  MCH 23.0* 22.3*  MCHC 30.0 28.9*  RDW 16.0* 15.9*  PLT 226 247    Cardiac Enzymes Recent Labs  Lab 05/15/18 0600 05/15/18 1029 05/15/18 1653  TROPONINI <0.03 0.06* 0.29*    Recent Labs  Lab 05/15/18 0130  TROPIPOC 0.00     BNP Recent Labs  Lab 05/15/18 0118  BNP 153.9*     DDimer  Recent Labs  Lab  05/15/18 0600  DDIMER 0.81*     Radiology    Dg Chest 2 View  Result Date: 05/15/2018 CLINICAL DATA:  Chest pain and shortness of breath EXAM: CHEST - 2 VIEW COMPARISON:  Oct 24, 2017 FINDINGS: Stable cardiomegaly. The hila and mediastinum are unchanged. No pulmonary nodules or masses. No focal infiltrates. IMPRESSION: No active cardiopulmonary disease. Electronically Signed   By: Dorise Bullion III M.D   On: 05/15/2018 01:42   Ct Coronary Morph W/cta Cor Nancy Fetter W/ca W/cm &/or Wo/cm  Addendum Date: 05/15/2018   ADDENDUM REPORT: 05/15/2018 21:54 CLINICAL DATA:  43F with hypertension and diabetes admitted with chest pain. EXAM: Cardiac/Coronary  CT TECHNIQUE: The patient was scanned on a [employer]. FINDINGS: A 120 kV prospective scan was triggered in the descending thoracic aorta at 111 HU's. Axial non-contrast 3 mm slices were carried out through the heart. The data set was analyzed on a dedicated work station and scored using the [place]. Gantry rotation speed was 250 msecs and collimation was .6 mm.  No beta blockade and 0.8 mg of sl NTG was given. The 3D data set was reconstructed in 5% intervals of the 67-82 % of the R-R cycle. Diastolic phases were analyzed on a dedicated work station using MPR, MIP and VRT modes. The patient received 80 cc of contrast. Aorta: Normal size. Ascending aorta 2.9 cm. Mild calcification of the aortic root. No dissection. Aortic Valve:  Trileaflet.  No calcifications. Coronary Arteries:  Normal coronary origin.  Right dominance. RCA is a small, non-dominant artery. There is minimal (1-24%) non-obstructing mixed plaque in the proximal RCA. Left main is a large artery that gives rise to LAD and LCX arteries. LAD is a very large vessel that wraps the apex and gives rise to the PDA. The LAD has minimal (1-24%) mixed calcified and soft attenuation plaque proximally and mild (25-49%) soft plaque in the mid LAD, worse at the level of D1. There is moderate (25-49%) mixed plaque in the apical LAD. LCX is a non-dominant artery that gives rise to two OM branches. There is minimal (1-24%) mixed plaque proximally. Other findings: Normal pulmonary vein drainage into the left atrium. Normal let atrial appendage without a thrombus. Pulmonary artery is enlarged consistent with elevated pulmonary pressures. IMPRESSION: 1.  Study quality is limited by body habitus. 2. Coronary calcium score of 71.9. This was 77th percentile for age and sex matched control. 2.  Normal coronary origin with hyperdominant LAD. 3. There is mild plaque in the mid LAD and moderate plaque in the distal LAD. 4.  Will send study for FFRct. 5.  Recommend aggressive risk factor modification. 6. Dilation of the pulmonary artery consistent with elevated pulmonary pressures. Skeet Latch, MD Electronically Signed   By: Skeet Latch   On: 05/15/2018 21:54   Result Date: 05/15/2018 EXAM: OVER-READ INTERPRETATION  CT CHEST The following report is an over-read performed by radiologist Dr. Suzy Bouchard of [facility]  [facility], [place] on 05/15/2018. This over-read does not include interpretation of cardiac or coronary anatomy or pathology. The coronary CTA interpretation by the cardiologist is attached. COMPARISON:  Chest radiograph 05/15/2018 FINDINGS: Limited view of the lung parenchyma demonstrates mild basilar atelectasis. Airways are normal. Limited view of the mediastinum demonstrates no adenopathy. Esophagus normal. Limited view of the upper abdomen unremarkable. Limited view of the skeleton and chest wall is unremarkable. Degenerative osteophytosis of the spine. IMPRESSION: 1. Mild basilar atelectasis. 2. Degenerate spurring of the spine Electronically Signed: By: Nicole Kindred  Leonia Reeves M.D. On: 05/15/2018 19:14    Cardiac Studies   Left heart catheterization 2009: CORONARY ANATOMY: The left main coronary artery was unremarkable.  Left anterior descending coronary artery. The left anterior descending coronary artery and its diagonal branches were unremarkable.  Left circumflex coronary artery. The left circumflex coronary artery was dominant and was essentially unremarkable and had a small posterior descending coronary artery, the distal half of the posterior septum was supplied by left anterior descending coronary artery. Right coronary artery. The right coronary artery was small and unremarkable.  LEFT VENTRICULOGRAM: The left ventriculogram was normal with ejection fraction of 65%.  IMPRESSION: 1. Normal coronaries. 2. Normal left ventricular systolic function.  RECOMMENDATIONS: This patient will be started on beta-blocker for her possible vasovagal syncope.   NST 01/2018:  The left ventricular ejection fraction is normal (55-65%).  Nuclear stress EF: 58%.  There was no ST segment deviation noted during stress.  The study is normal.  This is a low risk study.  Normal stress nuclear study with no ischemia or infarction. Gated ejection fraction 58% with normal  wall motion. Note significant right ventricular enlargement. Suggest echocardiogram to further assess.   Echocardiogram 01/2018: Study Conclusions  - Left ventricle: The cavity size was normal. Systolic function was normal. The estimated ejection fraction was in the range of 55% to 60%. Wall motion was normal; there were no regional wall motion abnormalities. Features are consistent with a pseudonormal left ventricular filling pattern, with concomitant abnormal relaxation and increased filling pressure (grade 2 diastolic dysfunction). - Ventricular septum: The contour showed systolic flattening. These changes are consistent with RV pressure overload. - Left atrium: The atrium was severely dilated. - Right ventricle: The cavity size was moderately dilated. Wall thickness was normal. - Right atrium: The atrium was severely dilated. - Atrial septum: No defect or patent foramen ovale was identified. - Pulmonary arteries: Systolic pressure was moderately increased. PA peak pressure: 67 mm Hg (S).  Coronary CTA 05/15/18: IMPRESSION: 1.  Study quality is limited by body habitus.  2. Coronary calcium score of 71.9. This was 77th percentile for age and sex matched control.  2.  Normal coronary origin with hyperdominant LAD.  3. There is mild plaque in the mid LAD and moderate plaque in the distal LAD.  4.  Will send study for FFRct.  5.  Recommend aggressive risk factor modification.  6. Dilation of the pulmonary artery consistent with elevated pulmonary pressures.  Patient Profile     73 y.o. female with a PMH of chronic diastolic CHF, DM type 2, HLD, obesity hypoventilation syndrome on O2 via [place] qHS, OSA not yet started on CPAP, seizure disorder, and chronic pancreatitis, who is being followed by cardiology for the evaluation of chest pain  Assessment & Plan    1. Chest pain: patient presented with substernal chest pain. Trops uptrended from  <0.03>0.06>0.29. EKG non-ischemic. She underwent a coronary CTA 05/15/18 which showed elevated calcium score of 71.9 (77th percentile), and moderate plaque in the LAD. Study was sent for [facility] - await results.  - Will follow-up FFR - if not significant, no further cardiac work-up necessary; if significant, will plan for LHC to further evaluate  - Continue risk factor modifications with aggressive HTN, HLD, DM, and OSA management - Will make NPO at this time just in case abnormal FFR reported earlier in the day as patient could potentially get cathed today  2. Chronic diastolic CHF: Echo 06/7614 with EF 55-60% and G2DD. No volume overload  complaints and she appears euvolemic on exam - Continue carvedilol, hydralazine, imdur, and lasix  3. HTN: BP stable - Continue home carvedilol, hydralazine, imdur, and lasix  4. HLD: LDL 53 this admission; at goal of <70. - Continue atorvastatin 10mg  daily  5. OSA: recent sleep study 03/2018 suggestive of OSA. She is to follow-up to start CPAP use - Encouraged her to follow-up with sleep doctor to coordinate starting CPAP    For questions or updates, please contact [place] Please consult [email or web address] for contact info under Cardiology/STEMI.      Signed, Abigail Butts, PA-C  05/16/2018, 10:59 AM   [phone]  PT denies CP   Breathing OK Lungs are CTA Cardiac RRR   No S3 Ext are without edema  CT scan showing lesion in LAD   Undergoing FFR analysis to see if flow limiting Keep NPO for now for possible cath/intervention later today if positive Pt understands   Agrees with plan    Dorris Carnes

## 2018-05-16 NOTE — Progress Notes (Signed)
Patient is s/p cath w/o findings of blockage:  Normal coronary arteries without evidence of obstructive disease. Upper normal LV systolic function, EF 50%.  EDP 21 mmHg.  Findings compatible with diastolic heart failure. RECOMMENDATIONS: Management per treating team.  Resume diet PT eval Possible d/c in AM after labs  Eulogio Bear DO

## 2018-05-16 NOTE — Care Management Note (Signed)
Case Management Note  Patient Details  Name: [name] MRN: [id number] Date of Birth: 02/02/1946  Subjective/Objective: 72 y.o. female presented with CP.                  Action/Plan: CM met with patient to discuss transitional needs. Patient lives at home with her sister/brother-in-law; sister assists with ADLs; patient ambulates with a rollator, and has a BSC, shower chair and nocturnal home O2 serviced by [facility]. PCP: Dr. [name]; pharmacy of choice: [facility]. PT eval pending; awaiting recommendations. Patient indicated her sister would be available to provide transportation home. CM will continue to follow.   Expected Discharge Date:  05/16/18               Expected Discharge Plan:  [place]  In-House Referral:  NA  Discharge planning Services  CM Consult  Post Acute Care Choice:  NA Choice offered to:  NA  DME Arranged:  N/A DME Agency:  NA  HH Arranged:  NA HH Agency:  NA  Status of Service:  In process, will continue to follow  If discussed at Long Length of Stay Meetings, dates discussed:    Additional Comments:  Midge Minium RN, BSN, NCM-BC, ACM-RN [phone] 05/16/2018, 9:54 AM

## 2018-05-16 NOTE — Progress Notes (Addendum)
CBG 56 at 1540. Asymptomatic. 0.5 amp D50 given. Repeat CBG 121 at 1555.

## 2018-05-16 NOTE — Interval H&P Note (Signed)
Cath Lab Visit (complete for each Cath Lab visit)  Clinical Evaluation Leading to the Procedure:   ACS: Yes.    Non-ACS:    Anginal Classification: CCS III  Anti-ischemic medical therapy: Minimal Therapy (1 class of medications)  Non-Invasive Test Results: Intermediate-risk stress test findings: cardiac mortality 1-3%/year  Prior CABG: No previous CABG      History and Physical Interval Note:  05/16/2018 2:48 PM  [name]  has presented today for surgery, with the diagnosis of cp  The various methods of treatment have been discussed with the patient and family. After consideration of risks, benefits and other options for treatment, the patient has consented to  Procedure(s): LEFT HEART CATH AND CORONARY ANGIOGRAPHY (N/A) as a surgical intervention .  The patient's history has been reviewed, patient examined, no change in status, stable for surgery.  I have reviewed the patient's chart and labs.  Questions were answered to the patient's satisfaction.     Belva Crome III

## 2018-05-16 NOTE — Telephone Encounter (Signed)
New message:      [facility] 05/26/18 @ 9:30 with Purcell Nails per Daleen Snook

## 2018-05-16 NOTE — H&P (View-Only) (Signed)
   FFR results reported by Dr. Oval Linsey suggests flow limiting lesion in D1/D2. Given chest pain at presentation and mildly elevated troponins, will pursue LHC today for further assessment of coronary anatomy. The patient understands that risks included but are not limited to stroke (1 in 1000), death (1 in 42), kidney failure [usually temporary] (1 in 500), bleeding (1 in 200), allergic reaction [possibly serious] (1 in 200). The patient understands and agrees to proceed.   Abigail Butts, PA-C 05/16/18

## 2018-05-16 NOTE — Progress Notes (Signed)
PT Cancellation Note  Patient Details Name: [name] MRN: [id number] DOB: [birth year]   Cancelled Treatment:    Reason Eval/Treat Not Completed: Patient not medically ready Pt with troponins that are trending up and per cardiology notes, may need cath depending on results of previous tests. Will await clearance from MD prior to initiation of PT and follow up as schedule allows.   Leighton Ruff, PT, DPT  Acute Rehabilitation Services  Pager: [phone] Office: [phone]    Rudean Hitt 05/16/2018, 11:56 AM

## 2018-05-17 DIAGNOSIS — R072 Precordial pain: Secondary | ICD-10-CM | POA: Diagnosis not present

## 2018-05-17 DIAGNOSIS — G4733 Obstructive sleep apnea (adult) (pediatric): Secondary | ICD-10-CM

## 2018-05-17 DIAGNOSIS — I5032 Chronic diastolic (congestive) heart failure: Secondary | ICD-10-CM

## 2018-05-17 DIAGNOSIS — E1165 Type 2 diabetes mellitus with hyperglycemia: Secondary | ICD-10-CM | POA: Diagnosis not present

## 2018-05-17 LAB — GLUCOSE, CAPILLARY
Glucose-Capillary: 111 mg/dL — ABNORMAL HIGH (ref 70–99)
Glucose-Capillary: 112 mg/dL — ABNORMAL HIGH (ref 70–99)
Glucose-Capillary: 128 mg/dL — ABNORMAL HIGH (ref 70–99)
Glucose-Capillary: 78 mg/dL (ref 70–99)

## 2018-05-17 LAB — CBC
HCT: 36.8 % (ref 36.0–46.0)
Hemoglobin: 11 g/dL — ABNORMAL LOW (ref 12.0–15.0)
MCH: 23 pg — ABNORMAL LOW (ref 26.0–34.0)
MCHC: 29.9 g/dL — ABNORMAL LOW (ref 30.0–36.0)
MCV: 76.8 fL — ABNORMAL LOW (ref 80.0–100.0)
Platelets: 226 10*3/uL (ref 150–400)
RBC: 4.79 MIL/uL (ref 3.87–5.11)
RDW: 15.9 % — ABNORMAL HIGH (ref 11.5–15.5)
WBC: 4 10*3/uL (ref 4.0–10.5)
nRBC: 0 % (ref 0.0–0.2)

## 2018-05-17 LAB — BASIC METABOLIC PANEL
Anion gap: 9 (ref 5–15)
BUN: 9 mg/dL (ref 8–23)
CO2: 29 mmol/L (ref 22–32)
Calcium: 8.6 mg/dL — ABNORMAL LOW (ref 8.9–10.3)
Chloride: 98 mmol/L (ref 98–111)
Creatinine, Ser: 0.94 mg/dL (ref 0.44–1.00)
GFR calc Af Amer: >60 mL/min (ref >60)
GFR calc non Af Amer: >60 mL/min (ref >60)
Glucose, Bld: 156 mg/dL — ABNORMAL HIGH (ref 70–99)
Potassium: 3.5 mmol/L (ref 3.5–5.1)
Sodium: 136 mmol/L (ref 135–145)

## 2018-05-17 MED ORDER — FUROSEMIDE 80 MG PO TABS: 40.0000 mg | ORAL_TABLET | ORAL | Status: DC

## 2018-05-17 MED ORDER — PANTOPRAZOLE SODIUM 40 MG PO TBEC: 40.0000 mg | DELAYED_RELEASE_TABLET | Freq: Every day | ORAL | 0 refills | Status: DC

## 2018-05-17 MED ORDER — DICLOFENAC SODIUM 1 % TD GEL: 2.0000 g | Freq: Four times a day (QID) | TRANSDERMAL | Status: AC | PRN

## 2018-05-17 MED ORDER — DICLOFENAC SODIUM 1 % TD GEL
2.0000 g | Freq: Four times a day (QID) | TRANSDERMAL | Status: DC
  Filled 2018-05-17: qty 100

## 2018-05-17 MED ORDER — FUROSEMIDE 80 MG PO TABS
80.0000 mg | ORAL_TABLET | Freq: Every day | ORAL | Status: DC
  Filled 2018-05-17: qty 1

## 2018-05-17 NOTE — Discharge Summary (Addendum)
Physician Discharge Summary  [name] UUV:[id number] DOB: 1946-02-16 DOA: 05/15/2018  PCP: [name], MD  Admit date: 05/15/2018 Discharge date: 05/17/2018  Admitted From: home Discharge disposition: home   Recommendations for Outpatient Follow-Up:   1. Home health 2. Cbc, bmp 1 week 3. 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  Diet recommendation: Low sodium, heart healthy.  Carbohydrate-modified.  Wound care: None.  Code status: Full.   History of Present Illness:  [name] is a 72 y.o. female with history of diastolic CHF, diabetes mellitus type 2, nonobstructive CAD, chronic anemia, obesity hypoventilation, hyperlipidemia, seizures, chronic pancreatitis presents to the ER after patient started developing chest pain last night at around 11 PM while patient was on her recliner.  Pain was retrosternal sharp radiating to both shoulders and left jaw.  Pain lasted until EMS gave patient sublingual nitroglycerin following which his pain resolved.  Denies any associated shortness of breath fever chills productive cough abdominal pain or nausea vomiting.  ED Course: In the ER patient's EKG showed normal sinus rhythm chest x-ray unremarkable troponin was negative.  Given the risk factors patient is being admitted for further management of chest pain.  Patient has had a stress test in August 2019 about 4 months ago which was low risk study.   Hospital Course by Problem:   Chest  pain -cath: Normal coronary arteries without evidence of obstructive disease. Upper normal LV systolic function, EF 47%. EDP 21 mmHg. Findings compatible with diastolic heart failure. -added PPI for non-cardiac chest pain  Chronic diastolic CHF -resume lasix  DM -resume home meds  ?OSA -outpateint follow up   Medical Consultants:    cards  Discharge Exam:   Vitals:   05/17/18 0834 05/17/18 1127  BP: (!) 155/75 140/76  Pulse: (!) 58   Resp: 18   Temp: 97.9 F (36.6 C)   SpO2: 94%    Vitals:   05/17/18 0022 05/17/18 0438 05/17/18 0834 05/17/18 1127  BP: (!) 111/54 (!) 109/47 (!) 155/75 140/76  Pulse: (!) 55 (!) 56 (!) 58   Resp: _0 Temp: 98 F (36.7 C) 98.2 F (36.8 C) 97.9 F (36.6 C)   TempSrc: Oral Oral Oral   SpO2: 94% 97% 94%   Weight:  111.4 kg    Height:        General exam: Appears calm and comfortable.   The results of significant diagnostics from this hospitalization (including imaging, microbiology, ancillary and laboratory) are listed below for reference.     Procedures and Diagnostic Studies:   Dg Chest 2 View  Result Date: 05/15/2018 CLINICAL DATA:  Chest pain and shortness of breath EXAM: CHEST - 2 VIEW COMPARISON:  Oct 24, 2017 FINDINGS: Stable cardiomegaly. The hila and mediastinum are unchanged. No pulmonary nodules or masses. No focal infiltrates. IMPRESSION: No active cardiopulmonary disease. Electronically Signed   By: Dorise Bullion III M.D   On: 05/15/2018 01:42   Ct Coronary Morph W/cta Cor Nancy Fetter W/ca W/cm &/  or Wo/cm  Addendum Date: 05/15/2018   ADDENDUM REPORT: 05/15/2018 21:54 CLINICAL DATA:  19F with hypertension and diabetes admitted with chest pain. EXAM: Cardiac/Coronary  CT TECHNIQUE: The patient was scanned on a [employer]. FINDINGS: A 120 kV prospective scan was triggered in the descending thoracic aorta at 111 HU's. Axial non-contrast 3 mm slices were carried out through the heart. The data set was  analyzed on a dedicated work station and scored using the [employer]. Gantry rotation speed was 250 msecs and collimation was .6 mm. No beta blockade and 0.8 mg of sl NTG was given. The 3D data set was reconstructed in 5% intervals of the 67-82 % of the R-R cycle. Diastolic phases were analyzed on a dedicated work station using MPR, MIP and VRT modes. The patient received 80 cc of contrast. Aorta: Normal size. Ascending aorta 2.9 cm. Mild calcification of the aortic root. No dissection. Aortic Valve:  Trileaflet.  No calcifications. Coronary Arteries:  Normal coronary origin.  Right dominance. RCA is a small, non-dominant artery. There is minimal (1-24%) non-obstructing mixed plaque in the proximal RCA. Left main is a large artery that gives rise to LAD and LCX arteries. LAD is a very large vessel that wraps the apex and gives rise to the PDA. The LAD has minimal (1-24%) mixed calcified and soft attenuation plaque proximally and mild (25-49%) soft plaque in the mid LAD, worse at the level of D1. There is moderate (25-49%) mixed plaque in the apical LAD. LCX is a non-dominant artery that gives rise to two OM branches. There is minimal (1-24%) mixed plaque proximally. Other findings: Normal pulmonary vein drainage into the left atrium. Normal let atrial appendage without a thrombus. Pulmonary artery is enlarged consistent with elevated pulmonary pressures. IMPRESSION: 1.  Study quality is limited by body habitus. 2. Coronary calcium score of 71.9. This was 77th percentile for age and sex matched control. 2.  Normal coronary origin with hyperdominant LAD. 3. There is mild plaque in the mid LAD and moderate plaque in the distal LAD. 4.  Will send study for FFRct. 5.  Recommend aggressive risk factor modification. 6. Dilation of the pulmonary artery consistent with elevated pulmonary pressures. Skeet Latch, MD Electronically Signed   By: Skeet Latch   On: 05/15/2018 21:54   Result Date: 05/15/2018 EXAM:  OVER-READ INTERPRETATION  CT CHEST The following report is an over-read performed by radiologist Dr. Suzy Bouchard of [facility], [place] on 05/15/2018. This over-read does not include interpretation of cardiac or coronary anatomy or pathology. The coronary CTA interpretation by the cardiologist is attached. COMPARISON:  Chest radiograph 05/15/2018 FINDINGS: Limited view of the lung parenchyma demonstrates mild basilar atelectasis. Airways are normal. Limited view of the mediastinum demonstrates no adenopathy. Esophagus normal. Limited view of the upper abdomen unremarkable. Limited view of the skeleton and chest wall is unremarkable. Degenerative osteophytosis of the spine. IMPRESSION: 1. Mild basilar atelectasis. 2. Degenerate spurring of the spine Electronically Signed: By: Suzy Bouchard M.D. On: 05/15/2018 19:14   Ct Coronary Fractional Flow Reserve Data Prep  Result Date: 05/16/2018 EXAM: FFRCT ANALYSIS Indication: Abnromal coronary CT-A. FINDINGS: FFRct analysis was performed on the original cardiac CT angiogram dataset. Diagrammatic representation of the FFRct analysis is provided in a separate PDF document in PACS. This dictation was created using the PDF document and an interactive 3D model of the results. 3D model is not available in the EMR/PACS. Normal FFR range is >0.80. 1. Left Main: FFRct 0.92.  Non-obstructive. 2. LAD: FFRct proximal 0.9, mid 0.84, distal 0.84 3. D1: FFRct proximal 0.84, mid 0.79, distal 0.72. Consistent with obstruction in mid D1 4. D2: FFRct proximal 0.84, mid 0.75. Consistent with obstructive disease in mid D2. 5. LCX: FFR proximal 0.91, mid 0.9, distal 0.79. Consistent with obstructive disease in distal LCX 6. RCA: FFRct proximal 0.99, mid 0.96.  Non-obstructive IMPRESSION: 1. FFRct concerning for obstructive disease in D1, D2 and distal LCX 2.  Recommend cardiac catheterization for further evaluation. 3. Recommend aggressive risk factor modification with aspirin  and high potency statin. Note: These examples are not recommendations of [employer] and only provided as examples of what other customers are doing. Electronically Signed   By: Skeet Latch   On: 05/16/2018 14:48     Labs:   Basic Metabolic Panel: Recent Labs  Lab 05/15/18 0118 05/15/18 0600 05/16/18 1850 05/17/18 0612  NA 136  --   --  136  K 5.1  --   --  3.5  CL 102  --   --  98  CO2 27  --   --  29  GLUCOSE 201*  --   --  156*  BUN 13  --   --  9  CREATININE 0.92 0.96 0.91 0.94  CALCIUM 9.1  --   --  8.6*   GFR Estimated Creatinine Clearance: 64.4 mL/min (by C-G formula based on SCr of 0.94 mg/dL). Liver Function Tests: Recent Labs  Lab 05/15/18 0600  AST 14*  ALT 12  ALKPHOS 81  BILITOT 0.5  PROT 7.0  ALBUMIN 3.3*   Recent Labs  Lab 05/15/18 0600  LIPASE 34   No results for input(s): AMMONIA in the last 168 hours. Coagulation profile No results for input(s): INR, PROTIME in the last 168 hours.  CBC: Recent Labs  Lab 05/15/18 0118 05/15/18 0600 05/16/18 1850 05/17/18 0612  WBC 4.6 4.3 4.1 4.0  HGB 11.8* 10.6* 11.3* 11.0*  HCT 39.3 36.7 39.1 36.8  MCV 76.8* 77.3* 76.7* 76.8*  PLT 226 247 228 226   Cardiac Enzymes: Recent Labs  Lab 05/15/18 0600 05/15/18 1029 05/15/18 1653  TROPONINI <0.03 0.06* 0.29*   BNP: Invalid input(s): POCBNP CBG: Recent Labs  Lab 05/16/18 1656 05/16/18 2027 05/17/18 0017 05/17/18 0424 05/17/18 0757  GLUCAP 90 199* 112* 128* 111*   D-Dimer Recent Labs    05/15/18 0600  DDIMER 0.81*   Hgb A1c No results for input(s): HGBA1C in the last 72 hours. Lipid Profile Recent Labs    05/15/18 0600  CHOL 137  HDL 60  LDLCALC 53  TRIG 120  CHOLHDL 2.3   Thyroid function studies No results for input(s): TSH, T4TOTAL, T3FREE, THYROIDAB in the last 72 hours.  Invalid input(s): FREET3 Anemia work up No results for input(s): VITAMINB12, FOLATE, FERRITIN, TIBC, IRON, RETICCTPCT in the last 72  hours. Microbiology No results found for this or any previous visit (from the past 240 hour(s)).   Discharge Instructions:   Discharge Instructions    Diet - low sodium heart healthy   Complete by:  As directed    Diet Carb Modified   Complete by:  As directed    Increase activity slowly   Complete by:  As directed      Allergies as of 05/17/2018      Reactions   Penicillins Hives   Has patient had a PCN reaction causing immediate rash, facial/tongue/throat swelling, SOB or lightheadedness with hypotension: Yes Has patient had a PCN reaction  causing severe rash involving mucus membranes or skin necrosis: No Has patient had a PCN reaction that required hospitalization: No Has patient had a PCN reaction occurring within the last 10 years: No If all of the above answers are "NO", then may proceed with Cephalosporin use.   Sulfa Antibiotics Hives   Aspirin Other (See Comments)    On aspirin 81 mg - Rectal bleeding in dec 2018   Codeine    Headache and makes the patient feel "off"   Other Rash   Bleach      Medication List    STOP taking these medications   Insulin Glargine 300 UNIT/ML Sopn     TAKE these medications   ACCU-CHEK NANO SMARTVIEW w/Device Kit Use to check blood sugar 3 times per day dx code E11.65   acetaminophen 500 MG tablet Commonly known as:  TYLENOL Take 500 mg by mouth every 4 (four) hours as needed (for pain).   atorvastatin 10 MG tablet Commonly known as:  LIPITOR Take 1 tablet (10 mg total) by mouth daily.   B-complex with vitamin C tablet Take 1 tablet by mouth daily.   carvedilol 25 MG tablet Commonly known as:  COREG TAKE 1 TABLET BY MOUTH TWICE DAILY What changed:  when to take this   diclofenac sodium 1 % Gel Commonly known as:  VOLTAREN Apply 2 g topically 4 (four) times daily as needed (pain).   DROPLET PEN NEEDLES 31G X 8 MM Misc Generic drug:  Insulin Pen Needle USE DAILY WITH VICTOZA AND/OR NOVOLOG.   furosemide 80 MG  tablet Commonly known as:  LASIX Take 0.5-1 tablets (40-80 mg total) by mouth See admin instructions. Take 80 mg M--F, take 40 mg all other days   hydrALAZINE 25 MG tablet Commonly known as:  APRESOLINE Take 1 tablet (25 mg total) by mouth 3 (three) times daily.   isosorbide mononitrate 60 MG 24 hr tablet Commonly known as:  IMDUR Take 1 tablet (60 mg total) by mouth daily.   lamoTRIgine 100 MG tablet Commonly known as:  LAMICTAL Take 1 tablet (100 mg total) by mouth 2 (two) times daily.   Menthol (Topical Analgesic) 3.5 % Gel Apply 1 application topically as needed (for neuropathy in feet).   metolazone 2.5 MG tablet Commonly known as:  ZAROXOLYN Take 1 tablet (2.5 mg total) by mouth See admin instructions. Take 1 tablet Monday and Friday only if weight is greater or equal to 269   multivitamin with minerals tablet Take 1 tablet by mouth daily.   OZEMPIC (1 MG/DOSE) 2 MG/1.5ML Sopn Generic drug:  Semaglutide (1 MG/DOSE) INJECT (1 MG) SUBCUTANEOUSLY EVERY WEEK ON THE SAME DAY OF EACH WEEK, IN THE ABDOMEN, THIGHS, OR UPPER ARM ROTATING INJECTION SITES What changed:  See the new instructions.   pantoprazole 40 MG tablet Commonly known as:  PROTONIX Take 1 tablet (40 mg total) by mouth daily.   potassium chloride SA 20 MEQ tablet Commonly known as:  K-DUR,KLOR-CON Take 2 tablets by mouth daily. On the day you take metolazone, take extra tablet for a total of 3 (18mq)   pregabalin 100 MG capsule Commonly known as:  LYRICA Take 1 capsule (100 mg total) by mouth 2 (two) times daily.   sertraline 50 MG tablet Commonly known as:  ZOLOFT TAKE 1 TABLET BY MOUTH ONCE DAILY   Vitamin D-3 25 MCG (1000 UT) Caps Take 1,000 Units by mouth daily.   ZENPEP 25000-79000 units Cpep Generic drug:  Pancrelipase (Lip-Prot-Amyl) Take  1-2 capsules by mouth See admin instructions. Take 2 capsules with meals and 1 capsule with snacks      Follow-up Information    Lendon Colonel, NP  Follow up on 05/26/2018.   Specialties:  Nurse Practitioner, Radiology, Cardiology Why:  Please arrive 15 minutes early for your 9:30am post-hospital cardiology follow-up appointment Contact information: [address] [place] [address] [phone]        [name], MD Follow up in 1 week(s).   Specialty:  Internal Medicine Contact information: [address] [place] [address] [facility] up.   Why:  They will do your home health care at your home Contact information: [address] [place] [address] [phone]            Time coordinating discharge: 25 min  Signed:  Geradine Girt DO  [facility] 05/17/2018, 5:29 PM

## 2018-05-17 NOTE — Progress Notes (Signed)
Progress Note  Patient Name: [name] Date of Encounter: 05/17/2018  Primary Cardiologist: Glenetta Hew, MD   Subjective   Breathing better today. Some left hip pain - may be from laying on the cath table. Cath yesterday showed normal coronary arteries with EF 70% and LVEDP of 21 mmHg. Echo also showed normal LV systolic function in 09/979. Net negative 2.6L overnight on oral lasix.  Weight down 4 kg since admit.   Inpatient Medications    Scheduled Meds: . atorvastatin  10 mg Oral Daily  . carvedilol  25 mg Oral BID WC  . enoxaparin (LOVENOX) injection  40 mg Subcutaneous Q24H  . furosemide  40 mg Oral Once per day on Sun Sat  . furosemide  80 mg Oral Once per day on Mon Tue Wed Thu Fri  . hydrALAZINE  25 mg Oral TID  . insulin aspart  0-9 Units Subcutaneous Q4H  . lamoTRIgine  100 mg Oral BID  . lipase/protease/amylase  48,000 Units Oral TID WC  . pantoprazole  40 mg Oral Daily  . pregabalin  100 mg Oral BID  . sertraline  50 mg Oral Daily  . sodium chloride flush  3 mL Intravenous Q12H   Continuous Infusions: . sodium chloride     PRN Meds: sodium chloride, acetaminophen **OR** acetaminophen, acetaminophen, lipase/protease/amylase, metolazone, nitroGLYCERIN, ondansetron **OR** ondansetron (ZOFRAN) IV, ondansetron (ZOFRAN) IV, sodium chloride flush   Vital Signs    Vitals:   05/16/18 2100 05/17/18 0022 05/17/18 0438 05/17/18 0834  BP:  (!) 111/54 (!) 109/47 (!) 155/75  Pulse:  (!) 55 (!) 56 (!) 58  Resp:  18 18 18   Temp:  98 F (36.7 C) 98.2 F (36.8 C) 97.9 F (36.6 C)  TempSrc:  Oral Oral Oral  SpO2: 94% 94% 97% 94%  Weight:   111.4 kg   Height:        Intake/Output Summary (Last 24 hours) at 05/17/2018 0928 Last data filed at 05/17/2018 0828 Gross per 24 hour  Intake 270 ml  Output 2800 ml  Net -2530 ml   Filed Weights   05/15/18 0350 05/16/18 1535 05/17/18 0438  Weight: 115.3 kg 113.4 kg 111.4 kg    Telemetry    Sinus rhythm/sinus  bradycardia with PACs - Personally Reviewed  Physical Exam   GEN: up brushing teeth, no distress Neck: No JVD, no carotid bruits Cardiac: RRR, no murmurs, rubs, or gallops.  Respiratory: Clear to auscultation bilaterally, no wheezes/ rales/ rhonchi GI: NABS, Soft, obese, nontender, non-distended  MS: No edema; No deformity. Neuro:  Nonfocal, moving all extremities spontaneously Psych: Normal affect   Labs    Chemistry Recent Labs  Lab 05/15/18 0118 05/15/18 0600 05/16/18 1850 05/17/18 0612  NA 136  --   --  136  K 5.1  --   --  3.5  CL 102  --   --  98  CO2 27  --   --  29  GLUCOSE 201*  --   --  156*  BUN 13  --   --  9  CREATININE 0.92 0.96 0.91 0.94  CALCIUM 9.1  --   --  8.6*  PROT  --  7.0  --   --   ALBUMIN  --  3.3*  --   --   AST  --  14*  --   --   ALT  --  12  --   --   ALKPHOS  --  81  --   --  BILITOT  --  0.5  --   --   GFRNONAA >60 59* >60 >60  GFRAA >60 >60 >60 >60  ANIONGAP 7  --   --  9     Hematology Recent Labs  Lab 05/15/18 0600 05/16/18 1850 05/17/18 0612  WBC 4.3 4.1 4.0  RBC 4.75 5.10 4.79  HGB 10.6* 11.3* 11.0*  HCT 36.7 39.1 36.8  MCV 77.3* 76.7* 76.8*  MCH 22.3* 22.2* 23.0*  MCHC 28.9* 28.9* 29.9*  RDW 15.9* 15.9* 15.9*  PLT 247 228 226    Cardiac Enzymes Recent Labs  Lab 05/15/18 0600 05/15/18 1029 05/15/18 1653  TROPONINI <0.03 0.06* 0.29*    Recent Labs  Lab 05/15/18 0130  TROPIPOC 0.00     BNP Recent Labs  Lab 05/15/18 0118  BNP 153.9*     DDimer  Recent Labs  Lab 05/15/18 0600  DDIMER 0.81*     Radiology    Ct Coronary Morph W/cta Cor W/score W/ca W/cm &/or Wo/cm  Addendum Date: 05/15/2018   ADDENDUM REPORT: 05/15/2018 21:54 CLINICAL DATA:  72F with hypertension and diabetes admitted with chest pain. EXAM: Cardiac/Coronary  CT TECHNIQUE: The patient was scanned on a [employer]. FINDINGS: A 120 kV prospective scan was triggered in the descending thoracic aorta at 111 HU's. Axial  non-contrast 3 mm slices were carried out through the heart. The data set was analyzed on a dedicated work station and scored using the [place]. Gantry rotation speed was 250 msecs and collimation was .6 mm. No beta blockade and 0.8 mg of sl NTG was given. The 3D data set was reconstructed in 5% intervals of the 67-82 % of the R-R cycle. Diastolic phases were analyzed on a dedicated work station using MPR, MIP and VRT modes. The patient received 80 cc of contrast. Aorta: Normal size. Ascending aorta 2.9 cm. Mild calcification of the aortic root. No dissection. Aortic Valve:  Trileaflet.  No calcifications. Coronary Arteries:  Normal coronary origin.  Right dominance. RCA is a small, non-dominant artery. There is minimal (1-24%) non-obstructing mixed plaque in the proximal RCA. Left main is a large artery that gives rise to LAD and LCX arteries. LAD is a very large vessel that wraps the apex and gives rise to the PDA. The LAD has minimal (1-24%) mixed calcified and soft attenuation plaque proximally and mild (25-49%) soft plaque in the mid LAD, worse at the level of D1. There is moderate (25-49%) mixed plaque in the apical LAD. LCX is a non-dominant artery that gives rise to two OM branches. There is minimal (1-24%) mixed plaque proximally. Other findings: Normal pulmonary vein drainage into the left atrium. Normal let atrial appendage without a thrombus. Pulmonary artery is enlarged consistent with elevated pulmonary pressures. IMPRESSION: 1.  Study quality is limited by body habitus. 2. Coronary calcium score of 71.9. This was 70th percentile for age and sex matched control. 2.  Normal coronary origin with hyperdominant LAD. 3. There is mild plaque in the mid LAD and moderate plaque in the distal LAD. 4.  Will send study for FFRct. 5.  Recommend aggressive risk factor modification. 6. Dilation of the pulmonary artery consistent with elevated pulmonary pressures. Skeet Latch, MD Electronically Signed    By: Skeet Latch   On: 05/15/2018 21:54   Result Date: 05/15/2018 EXAM: OVER-READ INTERPRETATION  CT CHEST The following report is an over-read performed by radiologist Dr. Suzy Bouchard of [facility], [place] on 05/15/2018. This over-read does not include interpretation  of cardiac or coronary anatomy or pathology. The coronary CTA interpretation by the cardiologist is attached. COMPARISON:  Chest radiograph 05/15/2018 FINDINGS: Limited view of the lung parenchyma demonstrates mild basilar atelectasis. Airways are normal. Limited view of the mediastinum demonstrates no adenopathy. Esophagus normal. Limited view of the upper abdomen unremarkable. Limited view of the skeleton and chest wall is unremarkable. Degenerative osteophytosis of the spine. IMPRESSION: 1. Mild basilar atelectasis. 2. Degenerate spurring of the spine Electronically Signed: By: Suzy Bouchard M.D. On: 05/15/2018 19:14   Ct Coronary Fractional Flow Reserve Data Prep  Result Date: 05/16/2018 EXAM: FFRCT ANALYSIS Indication: Abnromal coronary CT-A. FINDINGS: FFRct analysis was performed on the original cardiac CT angiogram dataset. Diagrammatic representation of the FFRct analysis is provided in a separate PDF document in PACS. This dictation was created using the PDF document and an interactive 3D model of the results. 3D model is not available in the EMR/PACS. Normal FFR range is >0.80. 1. Left Main: FFRct 0.92.  Non-obstructive. 2. LAD: FFRct proximal 0.9, mid 0.84, distal 0.84 3. D1: FFRct proximal 0.84, mid 0.79, distal 0.72. Consistent with obstruction in mid D1 4. D2: FFRct proximal 0.84, mid 0.75. Consistent with obstructive disease in mid D2. 5. LCX: FFR proximal 0.91, mid 0.9, distal 0.79. Consistent with obstructive disease in distal LCX 6. RCA: FFRct proximal 0.99, mid 0.96.  Non-obstructive IMPRESSION: 1. FFRct concerning for obstructive disease in D1, D2 and distal LCX 2.  Recommend cardiac catheterization for  further evaluation. 3. Recommend aggressive risk factor modification with aspirin and high potency statin. Note: These examples are not recommendations of [employer] and only provided as examples of what other customers are doing. Electronically Signed   By: Skeet Latch   On: 05/16/2018 14:48    Cardiac Studies   Left heart catheterization 2009: CORONARY ANATOMY: The left main coronary artery was unremarkable.  Left anterior descending coronary artery. The left anterior descending coronary artery and its diagonal branches were unremarkable.  Left circumflex coronary artery. The left circumflex coronary artery was dominant and was essentially unremarkable and had a small posterior descending coronary artery, the distal half of the posterior septum was supplied by left anterior descending coronary artery. Right coronary artery. The right coronary artery was small and unremarkable.  LEFT VENTRICULOGRAM: The left ventriculogram was normal with ejection fraction of 65%.  IMPRESSION: 1. Normal coronaries. 2. Normal left ventricular systolic function.  RECOMMENDATIONS: This patient will be started on beta-blocker for her possible vasovagal syncope.   NST 01/2018:  The left ventricular ejection fraction is normal (55-65%).  Nuclear stress EF: 58%.  There was no ST segment deviation noted during stress.  The study is normal.  This is a low risk study.  Normal stress nuclear study with no ischemia or infarction. Gated ejection fraction 58% with normal wall motion. Note significant right ventricular enlargement. Suggest echocardiogram to further assess.   Echocardiogram 01/2018: Study Conclusions  - Left ventricle: The cavity size was normal. Systolic function was normal. The estimated ejection fraction was in the range of 55% to 60%. Wall motion was normal; there were no regional wall motion abnormalities. Features are consistent  with a pseudonormal left ventricular filling pattern, with concomitant abnormal relaxation and increased filling pressure (grade 2 diastolic dysfunction). - Ventricular septum: The contour showed systolic flattening. These changes are consistent with RV pressure overload. - Left atrium: The atrium was severely dilated. - Right ventricle: The cavity size was moderately dilated. Wall thickness was normal. - Right atrium: The  atrium was severely dilated. - Atrial septum: No defect or patent foramen ovale was identified. - Pulmonary arteries: Systolic pressure was moderately increased. PA peak pressure: 67 mm Hg (S).  Coronary CTA 05/15/18: IMPRESSION: 1.  Study quality is limited by body habitus.  2. Coronary calcium score of 71.9. This was 70th percentile for age and sex matched control.  2.  Normal coronary origin with hyperdominant LAD.  3. There is mild plaque in the mid LAD and moderate plaque in the distal LAD.  4.  Will send study for FFRct.  5.  Recommend aggressive risk factor modification.  6. Dilation of the pulmonary artery consistent with elevated pulmonary pressures.  Patient Profile     72 y.o. female with a PMH of chronic diastolic CHF, DM type 2, HLD, obesity hypoventilation syndrome on O2 via [place] qHS, OSA not yet started on CPAP, seizure disorder, and chronic pancreatitis, who is being followed by cardiology for the evaluation of chest pain  Assessment & Plan    1. Chest pain: patient presented with substernal chest pain. Trops uptrended from <0.03>0.06>0.29. EKG non-ischemic. She underwent a coronary CTA 05/15/18 which showed elevated calcium score of 71.9 (77th percentile), and moderate plaque in the LAD. FFR abnormal -> cath yesterday showed normal coronaries, elevated LVEDP of 21 mmHg. The chest pressure has resolved.  2. Chronic diastolic CHF: Echo 08/8880 with EF 55-60% and G2DD. No volume overload complaints and she appears euvolemic on  exam - Continue carvedilol, hydralazine, imdur, and lasix 80 mg daily.  3. HTN: BP stable - Continue home carvedilol, hydralazine, imdur, and lasix  4. HLD: LDL 53 this admission; at goal of <70. - Continue atorvastatin 10mg  daily  5. OSA: recent sleep study 03/2018 suggestive of OSA. She is to follow-up to start CPAP use - Encouraged her to follow-up with sleep doctor to coordinate starting CPAP  For questions or updates, please contact [place] Please consult [email or web address] for contact info under Cardiology/STEMI.  Could probably be discharged today from a cardiac standpoint. Follow-up with Dr. Ellyn Hack.     Pixie Casino, MD, [facility], [place] Director of the [facility] Lipid Disorders &  Cardiovascular Risk Reduction Clinic Diplomate of the American Board of Clinical Lipidology Attending Cardiologist  Direct Dial: [phone]  Fax: [phone]  Website:  [email or web address]  Pixie Casino, MD  05/17/2018, 9:28 AM

## 2018-05-17 NOTE — Care Management Note (Signed)
Case Management Note  Patient Details  Name: [name] MRN: [id number] Date of Birth: 29-Jan-1946  Action/Plan: PCP Dr Bryon Lions; CM talked to patient about [place] choices, she chose [facility]; [facility] called for arrangements.   Expected Discharge Date:  05/17/18               Expected Discharge Plan:  [place]  Discharge planning Services  CM Consult Choice offered to:  Patient  HH Arranged:  PT [place]:  [place]  Status of Service:  Completed, signed off  Sherrilyn Rist [phone] 05/17/2018, 11:14 AM

## 2018-05-17 NOTE — Progress Notes (Signed)
Patient resting comfortably during shift report. Denies complaints.  

## 2018-05-17 NOTE — Evaluation (Signed)
Physical Therapy Evaluation Patient Details Name: [name] MRN: [id number] DOB: 1946-05-23 Today's Date: 05/17/2018   History of Present Illness  Patient is a 72 y/o female who presents with chest pain and SOB. s/p cardiac cath 11/22 with no blockages. PMH includes CHF, COPD, obesity hypoventilation syndrome, seizures, PD, HTN, DM, depression, CKD.  Clinical Impression  Patient presents with generalized weakness from hospitalization, pain, decreased activity tolerance and impaired mobility s/p above. Pt lives with sister and brother in law and does her own ADLs PTA. Uses rollator for ambulation. Has 24/7 supervision from family. Difficulty walking due to flat feet and pain through knees. Tolerated gait training and transfers with close min guard for safety. 1/4 DOE with Sp02 >92% on RA. Will follow acutely to maximize independence and mobility and improve endurance prior to return home. Encouraged walking to bathroom with nursing.     Follow Up Recommendations Home health PT;Supervision for mobility/OOB    Equipment Recommendations  None recommended by PT    Recommendations for Other Services       Precautions / Restrictions Precautions Precautions: Fall Restrictions Weight Bearing Restrictions: No      Mobility  Bed Mobility Overal bed mobility: Needs Assistance Bed Mobility: Supine to Sit     Supine to sit: Supervision;HOB elevated     General bed mobility comments: No assist needed, use of rail and increased time.   Transfers Overall transfer level: Needs assistance Equipment used: Rolling walker (2 wheeled) Transfers: Sit to/from Stand Sit to Stand: Min guard         General transfer comment: Min guard for safety. Slow to rise with use of momentum and RW for support. Transferred to chair post ambulation.   Ambulation/Gait Ambulation/Gait assistance: Min guard Gait Distance (Feet): 40 Feet Assistive device: Rolling walker (2 wheeled) Gait  Pattern/deviations: Step-through pattern;Trunk flexed;Decreased stride length;Decreased stance time - left Gait velocity: decreased   General Gait Details: Slow, mildly unsteady gait with guarded steps initially (reports as baseline) progressing to less guarded with increased time; 1/4 DOE. Sp02 remained >92% on RA.   Stairs            Wheelchair Mobility    Modified Rankin (Stroke Patients Only)       Balance Overall balance assessment: Needs assistance;History of Falls Sitting-balance support: Feet supported;No upper extremity supported Sitting balance-Leahy Scale: Fair     Standing balance support: During functional activity;Bilateral upper extremity supported Standing balance-Leahy Scale: Poor Standing balance comment: Reliant on BUEs for support in standing.                             Pertinent Vitals/Pain Pain Assessment: 0-10 Pain Score: 7  Pain Location: left side Pain Descriptors / Indicators: Aching;Sore Pain Intervention(s): Monitored during session;Limited activity within patient's tolerance    Home Living Family/patient expects to be discharged to:: Private residence Living Arrangements: Other relatives(sister and brother in law) Available Help at Discharge: Family;Available 24 hours/day Type of Home: House Home Access: Stairs to enter Entrance Stairs-Rails: None Entrance Stairs-Number of Steps: 1 + 2 Home Layout: One level Home Equipment: Cane - single point;Bedside commode;Walker - 4 wheels;Tub bench Additional Comments: Family working on getting a ramp and rails put in,    Prior Function Level of Independence: Needs assistance   Gait / Transfers Assistance Needed: ambulates with use of SPC or rollator.  ADL's / Homemaking Assistance Needed: Does her own ADLs.   Comments: Able to  help with cooking a little bit.     Hand Dominance   Dominant Hand: Right    Extremity/Trunk Assessment   Upper Extremity Assessment Upper  Extremity Assessment: Defer to OT evaluation    Lower Extremity Assessment Lower Extremity Assessment: Generalized weakness(flat feet bilaterally- does not wear braces anymore, wears insoles in shoes)       Communication   Communication: No difficulties  Cognition Arousal/Alertness: Awake/alert Behavior During Therapy: WFL for tasks assessed/performed Overall Cognitive Status: Within Functional Limits for tasks assessed                                        General Comments      Exercises     Assessment/Plan    PT Assessment Patient needs continued PT services  PT Problem List Decreased strength;Decreased mobility;Decreased activity tolerance;Pain;Decreased balance       PT Treatment Interventions Functional mobility training;Patient/family education;Balance training;Gait training;Therapeutic activities;Stair training;Therapeutic exercise    PT Goals (Current goals can be found in the Care Plan section)  Acute Rehab PT Goals Patient Stated Goal: to go home PT Goal Formulation: With patient Time For Goal Achievement: 05/31/18 Potential to Achieve Goals: Good    Frequency Min 3X/week   Barriers to discharge Inaccessible home environment stairs to enter home- working on getting rails put in and a ramp    Co-evaluation               AM-PAC PT "6 Clicks" Mobility  Outcome Measure Help needed turning from your back to your side while in a flat bed without using bedrails?: A Little Help needed moving from lying on your back to sitting on the side of a flat bed without using bedrails?: A Little Help needed moving to and from a bed to a chair (including a wheelchair)?: A Little Help needed standing up from a chair using your arms (e.g., wheelchair or bedside chair)?: A Little Help needed to walk in hospital room?: None Help needed climbing 3-5 steps with a railing? : A Little 6 Click Score: 19    End of Session Equipment Utilized During  Treatment: Gait belt Activity Tolerance: Patient tolerated treatment well Patient left: in chair;with call bell/phone within reach;with chair alarm set Nurse Communication: Mobility status PT Visit Diagnosis: Unsteadiness on feet (R26.81);Pain Pain - Right/Left: Left Pain - part of body: (flank)    Time: 2409-7353 PT Time Calculation (min) (ACUTE ONLY): 25 min   Charges:   PT Evaluation $PT Eval Low Complexity: 1 Low PT Treatments $Gait Training: 8-22 mins        Wray Kearns, PT, DPT Acute Rehabilitation Services Pager [phone] Office [phone]      Marguarite Arbour A Sabra Heck 05/17/2018, 9:23 AM

## 2018-05-19 ENCOUNTER — Encounter (HOSPITAL_COMMUNITY): Payer: Self-pay | Admitting: Interventional Cardiology

## 2018-05-19 NOTE — Telephone Encounter (Signed)
Called patient, LVM advising to call back in regards to upcoming appointment.  Left call back number.

## 2018-05-20 NOTE — Progress Notes (Deleted)
Cardiology Office Note   Date:  05/20/2018   ID:  [name], DOB 06/28/1945, MRN [id number]  PCP:  [name], MD  Cardiologist:  Dr. Ellyn Hack  No chief complaint on file.    History of Present Illness: [name] is a 72 y.o. female who presents for post hospitalization follow up after admission for chest pain. She underwent a coronary CTA on 05/15/2018 demonstrating an elevated calcium score of 71.9, and moderate plaque in the LAD, FFR abnormal, with cardiac cath revealing normal coronaries. She was no found to be in volume overload. She was continued on carvedilol, hydralazine, Imdur and lasix 80 mg daily. She has a recent sleep study which was suggestive of OSA. She was to follow up to have CPAP.     Past Medical History:  Diagnosis Date  . Abnormal liver function     in the past.  . Anemia   . Arthritis    all over  . Bruises easily   . Cataract    right eye;immature  . Chronic back pain    stenosis  . Chronic cough   . Chronic kidney disease   . COPD (chronic obstructive pulmonary disease) ([place])   . Demand myocardial infarction [facility]) 2012   Demand Infarction in setting of Pancreatitis --> mild Troponin elevation, Non-obstructive CAD  . Depression    takes Abilify daily as well as Zoloft  . Diastolic heart failure 4098   Grade 1 diastolic Dysfunction by Echo   . Diverticulosis   . DM (diabetes mellitus) ([place])    takes Victoza daily as well as Lantus and Humalog  . Empty sella ([place])    on MRI in 2009.  . Glaucoma   . Headache(784.0)    last migraine-4-23yr ago  . History of blood transfusion    no abnormal reaction noted  . History of colon polyps    benign  . HTN (hypertension)    takes BK[employer] Morgan Energydaily  . Hyperlipidemia    takes Lipitor daily  . Joint swelling   . Nocturia   . Obstructive sleep apnea   . Pancreatitis    takes Pancrelipase daily  . [place]'s [place]    takes Sinemet daily  . Peripheral neuropathy     . Pneumonia 2012  . Seizures [place]    takes Lamictal daily and Primidone nightly;last seizure 2wks ago  . Urinary urgency    With increased frequency  . Varicose veins of both lower extremities with pain    With edema.  Takes daily Lasix    Past Surgical History:  Procedure Laterality Date  . ABDOMINAL HYSTERECTOMY    . APPENDECTOMY    . BACK SURGERY    . CARDIAC CATHETERIZATION  2009/March 2012   2009: (Dr. KAlvie Heidelberg Nonobstructive CAD; 2012: Minimal CAD --> false positive stress test  . Cardiac Event Monitor  September-October 2017   Sinus rhythm with occasional PACs and artifact. No arrhythmias besides one short run of tachycardia.  . CHOLECYSTECTOMY    . COLONOSCOPY N/A 08/15/2012   Procedure: COLONOSCOPY;  Surgeon: WArta Silence MD;  Location: [facility];  Service: Endoscopy;  Laterality: N/A;  . COLONOSCOPY WITH PROPOFOL Left 06/17/2017   Procedure: COLONOSCOPY WITH PROPOFOL;  Surgeon: KRonnette Juniper MD;  [place]  Service: Gastroenterology;  Laterality: Left;  . ESOPHAGOGASTRODUODENOSCOPY    . eye cysts Bilateral   . LASIK    . LEFT HEART CATH AND CORONARY ANGIOGRAPHY N/A 05/16/2018   Procedure: LEFT HEART CATH  AND CORONARY ANGIOGRAPHY;  Surgeon: Belva Crome, MD;  Location: [place] [facility];  Service: Cardiovascular;  Laterality: N/A;  . NM MYOVIEW LTD  01/2016; 01/2018   a)LOW RISK. No ischemia or infarction;; b) EF 55-60%. NO ST changes.  No ischemia or Infarction. NO significant RV enlargement.  LOW RISK.  Marland Kitchen RECTAL POLYPECTOMY    . TONSILLECTOMY    . TRANSTHORACIC ECHOCARDIOGRAM  01/2016; 05/2018   A) Normal LV chamber size with mod LVH pattern. EF 50-55%. Severe LA dilation. Mod RA dilation. PAP elevated at 38 mmHg;; B) Mild concentric LVH.  EF 55-60% & no RWMA.  Grade 2 DD.  Diastolic flattening of the ventricular septum == ? elevated PAP.  Mild aortic valve calcification/sclerosis.  Mod LA dilation.  Mild RV dilation & Mod RA dilation  .  TRANSTHORACIC ECHOCARDIOGRAM  10/2017; 01/2018   a) EF 55-60%.  No RWMA,  Moderate LA dilation.  Mild LA dilation.  Peak PA pressures ~57 mmHg (moderate);;; b) EF 55-60%.  GRII DD.  Suggestion of RV volume overload with moderate RV dilation.  Severe LA and RA dilation.Marland Kitchen  PA pressure ~67%.  . TUBAL LIGATION    . vaginal cyst removed     several times     Current Outpatient Medications  Medication Sig Dispense Refill  . acetaminophen (TYLENOL) 500 MG tablet Take 500 mg by mouth every 4 (four) hours as needed (for pain).     Marland Kitchen atorvastatin (LIPITOR) 10 MG tablet Take 1 tablet (10 mg total) by mouth daily. 30 tablet 0  . B Complex-C (B-COMPLEX WITH VITAMIN C) tablet Take 1 tablet by mouth daily.    . Blood Glucose Monitoring Suppl (ACCU-CHEK NANO SMARTVIEW) W/DEVICE KIT Use to check blood sugar 3 times per day dx code E11.65 1 kit 0  . carvedilol (COREG) 25 MG tablet TAKE 1 TABLET BY MOUTH TWICE DAILY (Patient taking differently: Take 25 mg by mouth 2 (two) times daily with a meal. ) 180 tablet 1  . Cholecalciferol (VITAMIN D-3) 1000 units CAPS Take 1,000 Units by mouth daily.    . diclofenac sodium (VOLTAREN) 1 % GEL Apply 2 g topically 4 (four) times daily as needed (pain).    . DROPLET PEN NEEDLES 31G X 8 MM MISC USE DAILY WITH VICTOZA AND/OR NOVOLOG.  400 each 1  . furosemide (LASIX) 80 MG tablet Take 0.5-1 tablets (40-80 mg total) by mouth See admin instructions. Take 80 mg M--F, take 40 mg all other days    . hydrALAZINE (APRESOLINE) 25 MG tablet Take 1 tablet (25 mg total) by mouth 3 (three) times daily. 90 tablet 3  . isosorbide mononitrate (IMDUR) 60 MG 24 hr tablet Take 1 tablet (60 mg total) by mouth daily. 90 tablet 3  . lamoTRIgine (LAMICTAL) 100 MG tablet Take 1 tablet (100 mg total) by mouth 2 (two) times daily. 180 tablet 3  . Menthol, Topical Analgesic, 3.5 % GEL Apply 1 application topically as needed (for neuropathy in feet). 120 mL 0  . metolazone (ZAROXOLYN) 2.5 MG tablet Take  1 tablet (2.5 mg total) by mouth See admin instructions. Take 1 tablet Monday and Friday only if weight is greater or equal to 269 10 tablet 0  . Multiple Vitamins-Minerals (MULTIVITAMIN WITH MINERALS) tablet Take 1 tablet by mouth daily.    Marland Kitchen OZEMPIC, 1 MG/DOSE, 2 MG/1.5ML SOPN INJECT (1 MG) SUBCUTANEOUSLY EVERY WEEK ON THE SAME DAY OF EACH WEEK, IN THE ABDOMEN, THIGHS, OR UPPER ARM ROTATING INJECTION  SITES (Patient taking differently: Inject 1 mg into the skin once a week. ) 1 pen 1  . Pancrelipase, Lip-Prot-Amyl, (ZENPEP) 25000-79000 units CPEP Take 1-2 capsules by mouth See admin instructions. Take 2 capsules with meals and 1 capsule with snacks    . pantoprazole (PROTONIX) 40 MG tablet Take 1 tablet (40 mg total) by mouth daily. 30 tablet 0  . potassium chloride SA (K-DUR,KLOR-CON) 20 MEQ tablet Take 2 tablets by mouth daily. On the day you take metolazone, take extra tablet for a total of 3 (10mq) 70 tablet 11  . pregabalin (LYRICA) 100 MG capsule Take 1 capsule (100 mg total) by mouth 2 (two) times daily. 60 capsule 0  . sertraline (ZOLOFT) 50 MG tablet TAKE 1 TABLET BY MOUTH ONCE DAILY (Patient taking differently: Take 50 mg by mouth daily. ) 90 tablet 2   No current facility-administered medications for this visit.     Allergies:   Penicillins; Sulfa antibiotics; Aspirin; Codeine; and Other    Social History:  The patient  reports that she has quit smoking. Her smoking use included cigarettes. She has a 12.50 pack-year smoking history. She has never used smokeless tobacco. She reports that she does not drink alcohol or use drugs.   Family History:  The patient's family history includes Allergies in her father; Cancer in her other; Diabetes in her brother, mother, sister, and son; Heart disease in her father, mother, and sister; Heart failure in her father; Hyperlipidemia in her father, mother, and sister; Hypertension in her brother, father, mother, sister, sister, and son.    ROS: All  other systems are reviewed and negative. Unless otherwise mentioned in H&P    PHYSICAL EXAM: VS:  There were no vitals taken for this visit. , BMI There is no height or weight on file to calculate BMI. GEN: Well nourished, well developed, in no acute distress HEENT: normal Neck: no JVD, carotid bruits, or masses Cardiac: ***RRR; no murmurs, rubs, or gallops,no edema  Respiratory:  Clear to auscultation bilaterally, normal work of breathing GI: soft, nontender, nondistended, + BS MS: no deformity or atrophy Skin: warm and dry, no rash Neuro:  Strength and sensation are intact Psych: euthymic mood, full affect   EKG:   Left heart catheterization 2009: CORONARY ANATOMY: The left main coronary artery was unremarkable.  Left anterior descending coronary artery. The left anterior descending coronary artery and its diagonal branches were unremarkable.  Left circumflex coronary artery. The left circumflex coronary artery was dominant and was essentially unremarkable and had a small posterior descending coronary artery, the distal half of the posterior septum was supplied by left anterior descending coronary artery. Right coronary artery. The right coronary artery was small and unremarkable.  LEFT VENTRICULOGRAM: The left ventriculogram was normal with ejection fraction of 65%.  IMPRESSION: 1. Normal coronaries. 2. Normal left ventricular systolic function.  RECOMMENDATIONS: This patient will be started on beta-blocker for her possible vasovagal syncope.   NST 01/2018:  The left ventricular ejection fraction is normal (55-65%).  Nuclear stress EF: 58%.  There was no ST segment deviation noted during stress.  The study is normal.  This is a low risk study.  Normal stress nuclear study with no ischemia or infarction. Gated ejection fraction 58% with normal wall motion. Note significant right ventricular enlargement. Suggest echocardiogram to  further assess.   Echocardiogram 01/2018: Study Conclusions  - Left ventricle: The cavity size was normal. Systolic function was normal. The estimated ejection fraction was in the range  of 55% to 60%. Wall motion was normal; there were no regional wall motion abnormalities. Features are consistent with a pseudonormal left ventricular filling pattern, with concomitant abnormal relaxation and increased filling pressure (grade 2 diastolic dysfunction). - Ventricular septum: The contour showed systolic flattening. These changes are consistent with RV pressure overload. - Left atrium: The atrium was severely dilated. - Right ventricle: The cavity size was moderately dilated. Wall thickness was normal. - Right atrium: The atrium was severely dilated. - Atrial septum: No defect or patent foramen ovale was identified. - Pulmonary arteries: Systolic pressure was moderately increased. PA peak pressure: 67 mm Hg (S).  Coronary CTA 05/15/18: IMPRESSION: 1. Study quality is limited by body habitus.  2. Coronary calcium score of 71.9. This was 77th percentile for age and sex matched control.  2. Normal coronary origin with hyperdominant LAD.  3. There is mild plaque in the mid LAD and moderate plaque in the distal LAD.  4. Will send study for FFRct.  5. Recommend aggressive risk factor modification.  6. Dilation of the pulmonary artery consistent with elevated pulmonary pressures.  Patient Profile     72 y.o.femalewith a PMH of chronic diastolic CHF, DM type 2, HLD, obesity hypoventilation syndrome on O2 via [place] qHS, OSAnot yet started on CPAP, seizure disorder, and chronic pancreatitis,who is being followed by cardiology for the evaluation of chest pain   Recent Labs: 10/25/2017: Magnesium 1.7 05/15/2018: ALT 12; B Natriuretic Peptide 153.9 05/17/2018: BUN 9; Creatinine, Ser 0.94; Hemoglobin 11.0; Platelets 226; Potassium 3.5; Sodium 136    Lipid  Panel    Component Value Date/Time   CHOL 137 05/15/2018 0600   TRIG 120 05/15/2018 0600   HDL 60 05/15/2018 0600   CHOLHDL 2.3 05/15/2018 0600   VLDL 24 05/15/2018 0600   LDLCALC 53 05/15/2018 0600   LDLDIRECT 66.0 01/27/2015 1104      Wt Readings from Last 3 Encounters:  05/17/18 245 lb 9.5 oz (111.4 kg)  04/24/18 255 lb 12.8 oz (116 kg)  04/02/18 252 lb (114.3 kg)      Other studies Reviewed: Additional studies/ records that were reviewed today include: ***. Review of the above records demonstrates: ***   ASSESSMENT AND PLAN:  1.  ***   Current medicines are reviewed at length with the patient today.    Labs/ tests ordered today include: *** Phill Myron. [place] Pugh, ANP, AACC   05/20/2018 9:01 AM    [address] [place] [address] Office [phone] Fax [phone]

## 2018-05-20 NOTE — Telephone Encounter (Signed)
Patient contacted regarding discharge from [facility] on 05/17/18.    Patient understands to follow up with provider K. Lawrence DNP on 05/27/18 at 2:30 pm at [facility]  Patient understands discharge instructions? yes  Patient understands medications and regiment? yes  Patient understands to bring all medications to this visit? yes    Patient was unable to make appt on 12/2, appt rescheduled for 12/3.   Patient verbalized understanding.

## 2018-05-26 ENCOUNTER — Ambulatory Visit: Payer: Medicare HMO | Admitting: Adult Health

## 2018-05-26 NOTE — Progress Notes (Signed)
Cardiology Office Note   Date:  05/27/2018   ID:  [name], DOB [birth year], MRN [id number]  PCP:  [name], MD  Cardiologist:  Dr. Ellyn Hack  Chief Complaint  Patient presents with  . Hospitalization Follow-up  . Shortness of Breath  . Congestive Heart Failure  . Hypertension     History of Present Illness: [name] is a 72 y.o. female who presents for follow up from outpatient cardiac cath, with normal coronary arteries, and normal echocardiogram with normal LV function.  She was admitted with chronic diastolic CHF, Type II diabetes, HLD, obesity hypoventilation, on O2 via [place] at HS, OSA. She was treated for volume overload and sent home on po lasix. She had PPI added for non-cardiac chest pain.   She here today with her sister who reports that her breathing status has worsened over the last week. The patient does admit to eating some salty foods, to include ham over the Thanksgiving holiday. She is compliant with diuretics, taking 80 mg 5 days a week and then 40 mg on the weekends. She  Is weighing daily and weight has been less than ER admission. She is followed by [facility] for home checks of BP, blood sugars and weight, which she records in a binder provided to her.   Past Medical History:  Diagnosis Date  . Abnormal liver function     in the past.  . Anemia   . Arthritis    all over  . Bruises easily   . Cataract    right eye;immature  . Chronic back pain    stenosis  . Chronic cough   . Chronic kidney disease   . COPD (chronic obstructive pulmonary disease) ([place])   . Demand myocardial infarction [facility]) 2012   Demand Infarction in setting of Pancreatitis --> mild Troponin elevation, Non-obstructive CAD  . Depression    takes Abilify daily as well as Zoloft  . Diastolic heart failure 9628   Grade 1 diastolic Dysfunction by Echo   . Diverticulosis   . DM (diabetes mellitus) ([place])    takes Victoza daily as well as Lantus and Humalog  . Empty sella ([place])    on  MRI in 2009.  . Glaucoma   . Headache(784.0)    last migraine-4-25yr ago  . History of blood transfusion    no abnormal reaction noted  . History of colon polyps    benign  . HTN (hypertension)    takes BKinder Morgan Energydaily  . Hyperlipidemia    takes Lipitor daily  . Joint swelling   . Nocturia   . Obstructive sleep apnea   . Pancreatitis    takes Pancrelipase daily  . Parkinson's disease ([place]    takes Sinemet daily  . Peripheral neuropathy   . Pneumonia 2012  . [place]iz[place]    takes Lamictal daily and Primidone nightly;last seizure 2wks ago  . Urinary urgency    With increased frequency  . Varicose veins of both lower extremities with pain    With edema.  Takes daily Lasix    Past Surgical History:  Procedure Laterality Date  . ABDOMINAL HYSTERECTOMY    . APPENDECTOMY    . BACK SURGERY    . CARDIAC CATHETERIZATION  2009/March 2012   2009: (Dr. KAlvie He[place] Nonobstructive CAD; 2012: Minimal CAD --> false positive stress test  . Cardiac Event Monitor  September-October 2017   Sinus rhythm with occasional PACs and artifact. No arrhythmias besides one short run of tachycardia.  .Marland Kitchen  CHOLECYSTECTOMY    . COLONOSCOPY N/A 08/15/2012   Procedure: COLONOSCOPY;  Surgeon: Arta Silence, MD;  Location: [facility];  Service: Endoscopy;  Laterality: N/A;  . COLONOSCOPY WITH PROPOFOL Left 06/17/2017   Procedure: COLONOSCOPY WITH PROPOFOL;  Surgeon: [name], MD;  Location: [place];  Service: Gastroenterology;  Laterality: Left;  . ESOPHAGOGASTRODUODENOSCOPY    . eye cysts Bilateral   . LASIK    . LEFT HEART CATH AND CORONARY ANGIOGRAPHY N/A 05/16/2018   Procedure: LEFT HEART CATH AND CORONARY ANGIOGRAPHY;  Surgeon: Belva Crome, MD;  Location: [place] CV LAB;  Service: Cardiovascular;  Laterality: N/A;  . NM MYOVIEW LTD  01/2016; 01/2018   a)LOW RISK. No ischemia or infarction;; b) EF 55-60%. NO ST changes.  No ischemia or Infarction. NO significant RV  enlargement.  LOW RISK.  Marland Kitchen RECTAL POLYPECTOMY    . TONSILLECTOMY    . TRANSTHORACIC ECHOCARDIOGRAM  01/2016; 05/2018   A) Normal LV chamber size with mod LVH pattern. EF 50-55%. Severe LA dilation. Mod RA dilation. PAP elevated at 38 mmHg;; B) Mild concentric LVH.  EF 55-60% & no RWMA.  Grade 2 DD.  Diastolic flattening of the ventricular septum == ? elevated PAP.  Mild aortic valve calcification/sclerosis.  Mod LA dilation.  Mild RV dilation & Mod RA dilation  . TRANSTHORACIC ECHOCARDIOGRAM  10/2017; 01/2018   a) EF 55-60%.  No RWMA,  Moderate LA dilation.  Mild LA dilation.  Peak PA pressures ~57 mmHg (moderate);;; b) EF 55-60%.  GRII DD.  Suggestion of RV volume overload with moderate RV dilation.  Severe LA and RA dilation.Marland Kitchen  PA pressure ~67%.  . TUBAL LIGATION    . vaginal cyst removed     several times     Current Outpatient Medications  Medication Sig Dispense Refill  . acetaminophen (TYLENOL) 500 MG tablet Take 500 mg by mouth every 4 (four) hours as needed (for pain).     Marland Kitchen atorvastatin (LIPITOR) 10 MG tablet Take 1 tablet (10 mg total) by mouth daily. 30 tablet 0  . B Complex-C (B-COMPLEX WITH VITAMIN C) tablet Take 1 tablet by mouth daily.    . Blood Glucose Monitoring Suppl (ACCU-CHEK NANO SMARTVIEW) W/DEVICE KIT Use to check blood sugar 3 times per day dx code E11.65 1 kit 0  . carvedilol (COREG) 25 MG tablet TAKE 1 TABLET BY MOUTH TWICE DAILY (Patient taking differently: Take 25 mg by mouth 2 (two) times daily with a meal. ) 180 tablet 1  . Cholecalciferol (VITAMIN D-3) 1000 units CAPS Take 1,000 Units by mouth daily.    . diclofenac sodium (VOLTAREN) 1 % GEL Apply 2 g topically 4 (four) times daily as needed (pain).    . DROPLET PEN NEEDLES 31G X 8 MM MISC USE DAILY WITH VICTOZA AND/OR NOVOLOG.  400 each 1  . furosemide (LASIX) 80 MG tablet Take 0.5-1 tablets (40-80 mg total) by mouth See admin instructions. Take 80 mg M--F, take 40 mg all other days    . hydrALAZINE  (APRESOLINE) 25 MG tablet Take 1 tablet (25 mg total) by mouth 3 (three) times daily. 90 tablet 3  . lamoTRIgine (LAMICTAL) 100 MG tablet Take 1 tablet (100 mg total) by mouth 2 (two) times daily. 180 tablet 3  . Menthol, Topical Analgesic, 3.5 % GEL Apply 1 application topically as needed (for neuropathy in feet). 120 mL 0  . metolazone (ZAROXOLYN) 2.5 MG tablet Take 1 tablet (2.5 mg total) by mouth See admin  instructions. Take 1 tablet Monday and Friday only if weight is greater or equal to 269 10 tablet 0  . Multiple Vitamins-Minerals (MULTIVITAMIN WITH MINERALS) tablet Take 1 tablet by mouth daily.    Marland Kitchen OZEMPIC, 1 MG/DOSE, 2 MG/1.5ML SOPN INJECT (1 MG) SUBCUTANEOUSLY EVERY WEEK ON THE SAME DAY OF EACH WEEK, IN THE ABDOMEN, THIGHS, OR UPPER ARM ROTATING INJECTION SITES (Patient taking differently: Inject 1 mg into the skin once a week. ) 1 pen 1  . Pancrelipase, Lip-Prot-Amyl, (ZENPEP) 25000-79000 units CPEP Take 1-2 capsules by mouth See admin instructions. Take 2 capsules with meals and 1 capsule with snacks    . potassium chloride SA (K-DUR,KLOR-CON) 20 MEQ tablet Take 2 tablets by mouth daily. On the day you take metolazone, take extra tablet for a total of 3 (41mq) 70 tablet 11  . pregabalin (LYRICA) 100 MG capsule Take 1 capsule (100 mg total) by mouth 2 (two) times daily. 60 capsule 0  . sertraline (ZOLOFT) 50 MG tablet TAKE 1 TABLET BY MOUTH ONCE DAILY (Patient taking differently: Take 50 mg by mouth daily. ) 90 tablet 2  . isosorbide mononitrate (IMDUR) 60 MG 24 hr tablet Take 1 tablet (60 mg total) by mouth daily. 90 tablet 3   No current facility-administered medications for this visit.     Allergies:   Penicillins; Sulfa antibiotics; Aspirin; Codeine; and Other    Social History:  The patient  reports that she has quit smoking. Her smoking use included cigarettes. She has a 12.50 pack-year smoking history. She has never used smokeless tobacco. She reports that she does not drink  alcohol or use drugs.   Family History:  The patient's family history includes Allergies in her father; Cancer in her other; Diabetes in her brother, mother, sister, and son; Heart disease in her father, mother, and sister; Heart failure in her father; Hyperlipidemia in her father, mother, and sister; Hypertension in her brother, father, mother, sister, sister, and son.    ROS: All other systems are reviewed and negative. Unless otherwise mentioned in H&P    PHYSICAL EXAM: VS:  BP (!) 167/80   Pulse 64   Ht '5\' 1"'$  (1.549 m)   Wt 252 lb 12.8 oz (114.7 kg)   BMI 47.77 kg/m  , BMI Body mass index is 47.77 kg/m. GEN: Well nourished, well developed, in no acute distress.[place] obese.  HEENT: normal Neck: no JVD, carotid bruits, or masses Cardiac: RRR 2/6 systolic murmur, heard best at the left sternal boarder, no murmurs, rubs, or gallops,no edema  Respiratory:  Clear to auscultation bilaterally, normal work of breathing GI: soft, nontender, nondistended, + BS MS: no deformity or atrophy Skin: warm and dry, no rash Neuro:  Strength and sensation are intact Psych: euthymic mood, full affect   EKG: Not completed this office visit.   Recent Labs: 10/25/2017: Magnesium 1.7 05/15/2018: ALT 12; B Natriuretic Peptide 153.9 05/17/2018: BUN 9; Creatinine, Ser 0.94; Hemoglobin 11.0; Platelets 226; Potassium 3.5; Sodium 136    Lipid Panel    Component Value Date/Time   CHOL 137 05/15/2018 0600   TRIG 120 05/15/2018 0600   HDL 60 05/15/2018 0600   CHOLHDL 2.3 05/15/2018 0600   VLDL 24 05/15/2018 0600   LDLCALC 53 05/15/2018 0600   LDLDIRECT 66.0 01/27/2015 1104      Wt Readings from Last 3 Encounters:  05/27/18 252 lb 12.8 oz (114.7 kg)  05/17/18 245 lb 9.5 oz (111.4 kg)  04/24/18 255 lb 12.8 oz (116 kg)  Other studies Reviewed: Left heart catheterization 2009: CORONARY ANATOMY: The left main coronary artery was unremarkable.  Left anterior descending coronary artery.  The left anterior descending coronary artery and its diagonal branches were unremarkable.  Left circumflex coronary artery. The left circumflex coronary artery was dominant and was essentially unremarkable and had a small posterior descending coronary artery, the distal half of the posterior septum was supplied by left anterior descending coronary artery. Right coronary artery. The right coronary artery was small and unremarkable.  LEFT VENTRICULOGRAM: The left ventriculogram was normal with ejection fraction of 65%.  IMPRESSION: 1. Normal coronaries. 2. Normal left ventricular systolic function.  RECOMMENDATIONS: This patient will be started on beta-blocker for her possible vasovagal syncope.   NST 01/2018:  The left ventricular ejection fraction is normal (55-65%).  Nuclear stress EF: 58%.  There was no ST segment deviation noted during stress.  The study is normal.  This is a low risk study.  Normal stress nuclear study with no ischemia or infarction. Gated ejection fraction 58% with normal wall motion. Note significant right ventricular enlargement. Suggest echocardiogram to further assess.   Echocardiogram 01/2018: Study Conclusions  - Left ventricle: The cavity size was normal. Systolic function was normal. The estimated ejection fraction was in the range of 55% to 60%. Wall motion was normal; there were no regional wall motion abnormalities. Features are consistent with a pseudonormal left ventricular filling pattern, with concomitant abnormal relaxation and increased filling pressure (grade 2 diastolic dysfunction). - Ventricular septum: The contour showed systolic flattening. These changes are consistent with RV pressure overload. - Left atrium: The atrium was severely dilated. - Right ventricle: The cavity size was moderately dilated. Wall thickness was normal. - Right atrium: The atrium was severely  dilated. - Atrial septum: No defect or patent foramen ovale was identified. - Pulmonary arteries: Systolic pressure was moderately increased. PA peak pressure: 67 mm Hg (S).  ASSESSMENT AND PLAN:  1.  Diastolic CHF: She is continued on diuretics with lasix daily at 80 mg and 40 mg on the weekends. She has been compliant with this but has also been eating some salty foods. She brings with her weights which are recorded She is staying steady at 250 lbs. No evidence of fluid overload.   2. Hypertension: She is hypertensive today and has been elevated at home. I will increase her hydralazine 50 mg TID. She will continue to record her weights and her BP and will be followed at OP.   3. Chronic DOE: She has hypoventilation syndrome. I will refer her to pulmonology for further recommendations. In the interim I have given her Proventil HFA inhaler to assist with bronchodilatation to use PRN only.  Due to her obesity, I have advised her to reduce her calories and increase her activity if possible. Consider pulmonary rehab.       Current medicines are reviewed at length with the patient today.    Labs/ tests ordered today include: None Phill Myron. [place] Pugh, ANP, AACC   05/27/2018 3:05 PM    [address] Office [phone] Fax [phone]

## 2018-05-27 ENCOUNTER — Ambulatory Visit: Payer: Medicare HMO | Admitting: Adult Health

## 2018-05-27 ENCOUNTER — Encounter: Payer: Self-pay | Admitting: Adult Health

## 2018-05-27 VITALS — BP 167/80 | HR 64 | Ht 61.0 in | Wt 252.8 lb

## 2018-05-27 DIAGNOSIS — I5032 Chronic diastolic (congestive) heart failure: Secondary | ICD-10-CM | POA: Diagnosis not present

## 2018-05-27 DIAGNOSIS — F458 Other somatoform disorders: Secondary | ICD-10-CM | POA: Diagnosis not present

## 2018-05-27 DIAGNOSIS — I10 Essential (primary) hypertension: Secondary | ICD-10-CM | POA: Diagnosis not present

## 2018-05-27 DIAGNOSIS — R0609 Other forms of dyspnea: Secondary | ICD-10-CM

## 2018-05-27 MED ORDER — ALBUTEROL SULFATE HFA 108 (90 BASE) MCG/ACT IN AERS: 1.0000 | INHALATION_SPRAY | Freq: Four times a day (QID) | RESPIRATORY_TRACT | 1 refills | Status: DC | PRN

## 2018-05-27 MED ORDER — HYDRALAZINE HCL 50 MG PO TABS: 50.0000 mg | ORAL_TABLET | Freq: Three times a day (TID) | ORAL | 3 refills | Status: DC

## 2018-05-27 NOTE — Patient Instructions (Addendum)
Medication Instructions:  START PROVENTIL MDI-INHALER 1-2 PUFFS EVERY 6 HOURS AS NEEDED  INCREASE HYDRALAZINE 50MG  DAILY  If you need a refill on your cardiac medications before your next appointment, please call your pharmacy.  Labwork: If you have labs (blood work) drawn today and your tests are completely normal, you will receive your results only by: Marland Kitchen MyChart Message (if you have MyChart) OR . A paper copy in the mail If you have any lab test that is abnormal or we need to change your treatment, we will call you to review the results.  Special Instructions: CONTINUE TO TAKE AND LOG YOUR BP DAILY  REFERRAL TO PULMONARY   Follow-Up: You will need a follow up appointment in 6 WEEKS.    You may see  DR Cecil Cranker, DNP, AACC or one of the following Advanced Practice Providers on your designated Care Team:   . Jory Sims, DNP, ANP . Rosaria Ferries, PA-C At [facility], you and your health needs are our priority.  As part of our continuing mission to provide you with exceptional heart care, we have created designated Provider Care Teams.  These Care Teams include your primary Cardiologist (physician) and Advanced Practice Providers (APPs -  Physician Assistants and Nurse Practitioners) who all work together to provide you with the care you need, when you need it.

## 2018-05-27 NOTE — Addendum Note (Signed)
Addended by: Waylan Rocher on: 05/27/2018 03:19 PM   Modules accepted: Orders

## 2018-05-28 DIAGNOSIS — H409 Unspecified glaucoma: Secondary | ICD-10-CM

## 2018-05-28 DIAGNOSIS — K861 Other chronic pancreatitis: Secondary | ICD-10-CM

## 2018-05-28 DIAGNOSIS — I50810 Right heart failure, unspecified: Secondary | ICD-10-CM | POA: Diagnosis not present

## 2018-05-28 DIAGNOSIS — Z794 Long term (current) use of insulin: Secondary | ICD-10-CM

## 2018-05-28 DIAGNOSIS — J449 Chronic obstructive pulmonary disease, unspecified: Secondary | ICD-10-CM | POA: Diagnosis not present

## 2018-05-28 DIAGNOSIS — N189 Chronic kidney disease, unspecified: Secondary | ICD-10-CM | POA: Diagnosis not present

## 2018-05-28 DIAGNOSIS — E1142 Type 2 diabetes mellitus with diabetic polyneuropathy: Secondary | ICD-10-CM

## 2018-05-28 DIAGNOSIS — D631 Anemia in chronic kidney disease: Secondary | ICD-10-CM

## 2018-05-28 DIAGNOSIS — F329 Major depressive disorder, single episode, unspecified: Secondary | ICD-10-CM

## 2018-05-28 DIAGNOSIS — I272 Pulmonary hypertension, unspecified: Secondary | ICD-10-CM

## 2018-05-28 DIAGNOSIS — I83813 Varicose veins of bilateral lower extremities with pain: Secondary | ICD-10-CM

## 2018-05-28 DIAGNOSIS — E1122 Type 2 diabetes mellitus with diabetic chronic kidney disease: Secondary | ICD-10-CM | POA: Diagnosis not present

## 2018-05-28 DIAGNOSIS — Z9181 History of falling: Secondary | ICD-10-CM

## 2018-05-28 DIAGNOSIS — I130 Hypertensive heart and chronic kidney disease with heart failure and stage 1 through stage 4 chronic kidney disease, or unspecified chronic kidney disease: Secondary | ICD-10-CM | POA: Diagnosis not present

## 2018-05-28 DIAGNOSIS — G4733 Obstructive sleep apnea (adult) (pediatric): Secondary | ICD-10-CM

## 2018-05-28 DIAGNOSIS — M159 Polyosteoarthritis, unspecified: Secondary | ICD-10-CM | POA: Diagnosis not present

## 2018-05-28 DIAGNOSIS — I5032 Chronic diastolic (congestive) heart failure: Secondary | ICD-10-CM | POA: Diagnosis not present

## 2018-05-28 DIAGNOSIS — I2510 Atherosclerotic heart disease of native coronary artery without angina pectoris: Secondary | ICD-10-CM

## 2018-05-28 DIAGNOSIS — E662 Morbid (severe) obesity with alveolar hypoventilation: Secondary | ICD-10-CM

## 2018-05-28 DIAGNOSIS — I69354 Hemiplegia and hemiparesis following cerebral infarction affecting left non-dominant side: Secondary | ICD-10-CM | POA: Diagnosis not present

## 2018-05-28 DIAGNOSIS — G40909 Epilepsy, unspecified, not intractable, without status epilepticus: Secondary | ICD-10-CM

## 2018-05-28 DIAGNOSIS — M4800 Spinal stenosis, site unspecified: Secondary | ICD-10-CM

## 2018-05-28 DIAGNOSIS — G8929 Other chronic pain: Secondary | ICD-10-CM

## 2018-05-28 DIAGNOSIS — G20 Parkinson's disease: Secondary | ICD-10-CM | POA: Diagnosis not present

## 2018-05-29 ENCOUNTER — Other Ambulatory Visit: Payer: Self-pay | Admitting: Internal Medicine

## 2018-05-30 ENCOUNTER — Other Ambulatory Visit: Payer: Self-pay | Admitting: *Deleted

## 2018-05-30 NOTE — Patient Outreach (Signed)
[facility] [facility]) Care Management  05/30/2018  ANAST[place]IA CREEK 12/18/1945 [id number]   RN Health Coach Introductory Call/Post Hospitalization   Outreach Attempt:  Outreach attempt #3 to patient's sister [name] (release of information on file) for introduction and post hospitalization follow up. No answer. RN Health Coach left HIPAA compliant voicemail message along with contact information.  Plan:  RN Health Coach will send Unsuccessful Outreach Letter.  RN Health Coach will schedule another outreach attempt on primary [place] Schedule within the month of December.   [place] [phone] [email or web address]

## 2018-06-03 ENCOUNTER — Telehealth: Payer: Self-pay | Admitting: Cardiology

## 2018-06-03 NOTE — Telephone Encounter (Signed)
Routed to MD/RN 

## 2018-06-03 NOTE — Telephone Encounter (Signed)
Notified Wells Guiles @ [place] that PT orders should come from PCP R. Baird Cancer MD

## 2018-06-03 NOTE — Telephone Encounter (Signed)
New Message    Needs order for PT - for 1 week 1, 2 week 2 and everyother week for 4 weeks

## 2018-06-03 NOTE — Telephone Encounter (Signed)
Usually PCP order for PT.  Glenetta Hew, MD

## 2018-06-05 DIAGNOSIS — G20 Parkinson's disease: Secondary | ICD-10-CM | POA: Diagnosis not present

## 2018-06-05 DIAGNOSIS — I130 Hypertensive heart and chronic kidney disease with heart failure and stage 1 through stage 4 chronic kidney disease, or unspecified chronic kidney disease: Secondary | ICD-10-CM | POA: Diagnosis not present

## 2018-06-05 DIAGNOSIS — I50810 Right heart failure, unspecified: Secondary | ICD-10-CM | POA: Diagnosis not present

## 2018-06-05 DIAGNOSIS — I5032 Chronic diastolic (congestive) heart failure: Secondary | ICD-10-CM | POA: Diagnosis not present

## 2018-06-05 DIAGNOSIS — M159 Polyosteoarthritis, unspecified: Secondary | ICD-10-CM | POA: Diagnosis not present

## 2018-06-05 DIAGNOSIS — J449 Chronic obstructive pulmonary disease, unspecified: Secondary | ICD-10-CM | POA: Diagnosis not present

## 2018-06-05 DIAGNOSIS — E1122 Type 2 diabetes mellitus with diabetic chronic kidney disease: Secondary | ICD-10-CM | POA: Diagnosis not present

## 2018-06-05 DIAGNOSIS — I69354 Hemiplegia and hemiparesis following cerebral infarction affecting left non-dominant side: Secondary | ICD-10-CM | POA: Diagnosis not present

## 2018-06-05 DIAGNOSIS — N189 Chronic kidney disease, unspecified: Secondary | ICD-10-CM | POA: Diagnosis not present

## 2018-06-06 ENCOUNTER — Other Ambulatory Visit: Payer: Self-pay | Admitting: *Deleted

## 2018-06-06 DIAGNOSIS — I50810 Right heart failure, unspecified: Secondary | ICD-10-CM | POA: Diagnosis not present

## 2018-06-06 DIAGNOSIS — I69354 Hemiplegia and hemiparesis following cerebral infarction affecting left non-dominant side: Secondary | ICD-10-CM | POA: Diagnosis not present

## 2018-06-06 DIAGNOSIS — I130 Hypertensive heart and chronic kidney disease with heart failure and stage 1 through stage 4 chronic kidney disease, or unspecified chronic kidney disease: Secondary | ICD-10-CM | POA: Diagnosis not present

## 2018-06-06 DIAGNOSIS — E1122 Type 2 diabetes mellitus with diabetic chronic kidney disease: Secondary | ICD-10-CM | POA: Diagnosis not present

## 2018-06-06 DIAGNOSIS — N189 Chronic kidney disease, unspecified: Secondary | ICD-10-CM | POA: Diagnosis not present

## 2018-06-06 DIAGNOSIS — M159 Polyosteoarthritis, unspecified: Secondary | ICD-10-CM | POA: Diagnosis not present

## 2018-06-06 DIAGNOSIS — I5032 Chronic diastolic (congestive) heart failure: Secondary | ICD-10-CM | POA: Diagnosis not present

## 2018-06-06 DIAGNOSIS — G20 Parkinson's disease: Secondary | ICD-10-CM | POA: Diagnosis not present

## 2018-06-06 DIAGNOSIS — J449 Chronic obstructive pulmonary disease, unspecified: Secondary | ICD-10-CM | POA: Diagnosis not present

## 2018-06-06 NOTE — Patient Outreach (Signed)
[place] [facility]) Care Management  06/06/2018  [name] [birth year] [id number]   RN Health Coach Introductory Call/Post Hospitalization   Outreach Attempt:  Successful telephone outreach to patient for Health Coach introduction and post hospitalization follow up.  HIPAA verified with patient.  Patient with recent hospital discharge on 05/17/2018 for chest pain and post heart catheterization.  Reports continued shortness of breath with exertion.  Does state the inhaler helps with shortness of breath.  Reports compliance with medications.  Continues to monitor vital signs daily.  Weight this morning was 246 pounds below  her normal range of 250 pounds.  BP this morning was 147/72 with heart rate of 67.  Fasting blood sugar this morning was 109 with fasting ranges of 100-160's.  States she is keeping log of vitals to take to cardiologist.  Patient reporting she has [name] with [place] coming out.  RN Health Coach introduced self and role, informing patient [name] will be her primary RN Health Coach.  Patient verbally agrees to Disease Management outreaches.  States she is unable to complete initial telephone assessment at this time due to awaiting therapy to come work with her, request a call back after the holidays.  Encouraged patient to continue to monitor vital signs and record in log for physician review.  Appointments:  Patient has attended her post hospital Cardiology appointment with Bunnie Domino NP on 05/27/2018 and has follow up appointment with Dr. Ellyn Hack on 07/08/2018.  Has new consultation appointment for chronic shortness of breath with Pulmonologist Dr. Valeta Harms on 06/10/2018.  Patient encouraged to keep and attend appointment to discuss continued shortness of breath with exertion.  Next scheduled appointment with primary care provider, Dr. Baird Cancer is 07/24/2017.  Plan: RN Health Coach will place patient on primary [place] schedule for the month of January for initial telephone assessment.  [place] [phone] [name]

## 2018-06-08 DIAGNOSIS — R0602 Shortness of breath: Secondary | ICD-10-CM | POA: Diagnosis not present

## 2018-06-08 DIAGNOSIS — I5030 Unspecified diastolic (congestive) heart failure: Secondary | ICD-10-CM | POA: Diagnosis not present

## 2018-06-08 DIAGNOSIS — M48061 Spinal stenosis, lumbar region without neurogenic claudication: Secondary | ICD-10-CM | POA: Diagnosis not present

## 2018-06-08 DIAGNOSIS — I509 Heart failure, unspecified: Secondary | ICD-10-CM | POA: Diagnosis not present

## 2018-06-08 DIAGNOSIS — I5032 Chronic diastolic (congestive) heart failure: Secondary | ICD-10-CM | POA: Diagnosis not present

## 2018-06-08 DIAGNOSIS — G4733 Obstructive sleep apnea (adult) (pediatric): Secondary | ICD-10-CM | POA: Diagnosis not present

## 2018-06-08 DIAGNOSIS — J9621 Acute and chronic respiratory failure with hypoxia: Secondary | ICD-10-CM | POA: Diagnosis not present

## 2018-06-08 DIAGNOSIS — I10 Essential (primary) hypertension: Secondary | ICD-10-CM | POA: Diagnosis not present

## 2018-06-08 DIAGNOSIS — G4089 Other seizures: Secondary | ICD-10-CM | POA: Diagnosis not present

## 2018-06-09 ENCOUNTER — Institutional Professional Consult (permissible substitution): Payer: Medicare HMO | Admitting: Pulmonary Disease

## 2018-06-10 ENCOUNTER — Institutional Professional Consult (permissible substitution): Payer: Medicare HMO | Admitting: Pulmonary Disease

## 2018-06-12 DIAGNOSIS — G20 Parkinson's disease: Secondary | ICD-10-CM | POA: Diagnosis not present

## 2018-06-12 DIAGNOSIS — E1122 Type 2 diabetes mellitus with diabetic chronic kidney disease: Secondary | ICD-10-CM | POA: Diagnosis not present

## 2018-06-12 DIAGNOSIS — I50810 Right heart failure, unspecified: Secondary | ICD-10-CM | POA: Diagnosis not present

## 2018-06-12 DIAGNOSIS — I130 Hypertensive heart and chronic kidney disease with heart failure and stage 1 through stage 4 chronic kidney disease, or unspecified chronic kidney disease: Secondary | ICD-10-CM | POA: Diagnosis not present

## 2018-06-12 DIAGNOSIS — M159 Polyosteoarthritis, unspecified: Secondary | ICD-10-CM | POA: Diagnosis not present

## 2018-06-12 DIAGNOSIS — I69354 Hemiplegia and hemiparesis following cerebral infarction affecting left non-dominant side: Secondary | ICD-10-CM | POA: Diagnosis not present

## 2018-06-12 DIAGNOSIS — N189 Chronic kidney disease, unspecified: Secondary | ICD-10-CM | POA: Diagnosis not present

## 2018-06-12 DIAGNOSIS — J449 Chronic obstructive pulmonary disease, unspecified: Secondary | ICD-10-CM | POA: Diagnosis not present

## 2018-06-12 DIAGNOSIS — I5032 Chronic diastolic (congestive) heart failure: Secondary | ICD-10-CM | POA: Diagnosis not present

## 2018-06-13 DIAGNOSIS — I5032 Chronic diastolic (congestive) heart failure: Secondary | ICD-10-CM | POA: Diagnosis not present

## 2018-06-13 DIAGNOSIS — I69354 Hemiplegia and hemiparesis following cerebral infarction affecting left non-dominant side: Secondary | ICD-10-CM | POA: Diagnosis not present

## 2018-06-13 DIAGNOSIS — I130 Hypertensive heart and chronic kidney disease with heart failure and stage 1 through stage 4 chronic kidney disease, or unspecified chronic kidney disease: Secondary | ICD-10-CM | POA: Diagnosis not present

## 2018-06-13 DIAGNOSIS — I50810 Right heart failure, unspecified: Secondary | ICD-10-CM | POA: Diagnosis not present

## 2018-06-13 DIAGNOSIS — N189 Chronic kidney disease, unspecified: Secondary | ICD-10-CM | POA: Diagnosis not present

## 2018-06-13 DIAGNOSIS — M159 Polyosteoarthritis, unspecified: Secondary | ICD-10-CM | POA: Diagnosis not present

## 2018-06-13 DIAGNOSIS — G20 Parkinson's disease: Secondary | ICD-10-CM | POA: Diagnosis not present

## 2018-06-13 DIAGNOSIS — E1122 Type 2 diabetes mellitus with diabetic chronic kidney disease: Secondary | ICD-10-CM | POA: Diagnosis not present

## 2018-06-13 DIAGNOSIS — J449 Chronic obstructive pulmonary disease, unspecified: Secondary | ICD-10-CM | POA: Diagnosis not present

## 2018-06-20 DIAGNOSIS — J449 Chronic obstructive pulmonary disease, unspecified: Secondary | ICD-10-CM | POA: Diagnosis not present

## 2018-06-20 DIAGNOSIS — I130 Hypertensive heart and chronic kidney disease with heart failure and stage 1 through stage 4 chronic kidney disease, or unspecified chronic kidney disease: Secondary | ICD-10-CM | POA: Diagnosis not present

## 2018-06-20 DIAGNOSIS — I69354 Hemiplegia and hemiparesis following cerebral infarction affecting left non-dominant side: Secondary | ICD-10-CM | POA: Diagnosis not present

## 2018-06-20 DIAGNOSIS — N189 Chronic kidney disease, unspecified: Secondary | ICD-10-CM | POA: Diagnosis not present

## 2018-06-20 DIAGNOSIS — G20 Parkinson's disease: Secondary | ICD-10-CM | POA: Diagnosis not present

## 2018-06-20 DIAGNOSIS — I50810 Right heart failure, unspecified: Secondary | ICD-10-CM | POA: Diagnosis not present

## 2018-06-20 DIAGNOSIS — M159 Polyosteoarthritis, unspecified: Secondary | ICD-10-CM | POA: Diagnosis not present

## 2018-06-20 DIAGNOSIS — E1122 Type 2 diabetes mellitus with diabetic chronic kidney disease: Secondary | ICD-10-CM | POA: Diagnosis not present

## 2018-06-20 DIAGNOSIS — I5032 Chronic diastolic (congestive) heart failure: Secondary | ICD-10-CM | POA: Diagnosis not present

## 2018-06-23 ENCOUNTER — Other Ambulatory Visit: Payer: Self-pay | Admitting: Neurology

## 2018-06-23 DIAGNOSIS — G4736 Sleep related hypoventilation in conditions classified elsewhere: Secondary | ICD-10-CM

## 2018-06-23 DIAGNOSIS — IMO0002 Reserved for concepts with insufficient information to code with codable children: Secondary | ICD-10-CM

## 2018-06-23 DIAGNOSIS — G4733 Obstructive sleep apnea (adult) (pediatric): Secondary | ICD-10-CM

## 2018-06-23 DIAGNOSIS — G4719 Other hypersomnia: Secondary | ICD-10-CM

## 2018-06-26 ENCOUNTER — Ambulatory Visit: Payer: Medicare HMO | Admitting: Pulmonary Disease

## 2018-06-26 ENCOUNTER — Encounter: Payer: Self-pay | Admitting: Pulmonary Disease

## 2018-06-26 DIAGNOSIS — G4733 Obstructive sleep apnea (adult) (pediatric): Secondary | ICD-10-CM

## 2018-06-26 DIAGNOSIS — J9611 Chronic respiratory failure with hypoxia: Secondary | ICD-10-CM

## 2018-06-26 DIAGNOSIS — E662 Morbid (severe) obesity with alveolar hypoventilation: Secondary | ICD-10-CM

## 2018-06-26 DIAGNOSIS — I5032 Chronic diastolic (congestive) heart failure: Secondary | ICD-10-CM | POA: Diagnosis not present

## 2018-06-26 NOTE — Assessment & Plan Note (Signed)
She does have significant pulmonary hypertension based on her last echo, would be curious to repeat echo when she is at her dry weight to see if this is improved

## 2018-06-26 NOTE — Assessment & Plan Note (Signed)
Does not seem to qualify for portable oxygen, remains to be seen if she needs nocturnal oxygen based on repeat titration study since sleep due to her breathing seems to have improved with weight loss and diuresis

## 2018-06-26 NOTE — Patient Instructions (Signed)
You do not qualify for portable oxygen.  Call us if you have not been set up with a CPAP machine in 10 days after your study

## 2018-06-26 NOTE — Progress Notes (Signed)
Subjective:    Patient ID: [name], female    DOB: 1946-06-19, 73 y.o.   MRN: [id number]  HPI Chief Complaint  Patient presents with  . Pulm Consult    Referred by cardiologist Dr. Ellyn Hack for increased SOB. Has noticed this for the past year.    73 year old obese remote smoker presents for evaluation of dyspnea on exertion. She has hypertensive heart disease and chronic diastolic heart failure.  She has undergone detailed cardiac evaluation-echo 01/2018 showed grade 2 diastolic dysfunction with RVSP of 67 mm and normal LV systolic function Coronary CT was inconclusive and cardiac cath 04/2018 showed normal coronaries with a EDP of 21.  She is maintained on a diuretic regimen of 80 mg Lasix twice daily and on weekends she drops this to 40 mg, she has lost weight from 270 pounds to her current weight of 253 pounds. She also carries a diagnosis of obesity hypoventilation syndrome and I note ABG 05/2017 was 7.41/50 1/50 5/88% on room air.  PFTs in the past have not shown any evidence of airway obstruction.  She smoked less than 20 pack years before she quit in her 65s.  Her main complaint is dyspnea on exertion.  She reports dyspnea on walking, bending, exercise which is relieved by resting.  She denies wheezing or childhood history of asthma.  She denies recurrent chest colds.  She uses nocturnal oxygen occasionally but has not qualified for portable oxygen.  She has undergone multiple sleep studies with neurology which I reviewed  03/2016 NPSG was 60/hour, Epworth sleepiness score then was 17 she was prescribed CPAP but was unable to obtain a machine due to cost although neurology notes indicate that she was also offered a refurbished machine by [facility] at no cost CPAP was titrated to 9 cm and she required 2 L of oxygen  She seems to have some reservation about using a full facemask. N PSG 02/2018 -weight 258 pounds-showed to 60 minutes of supine sleep with AHI of 7.4/hour mostly  during REM sleep  She has lost about 20 pounds in the past 3 years Epworth sleepiness score is now decreased to 10 She sleeps on her side with 2 pillows denies paroxysmal nocturnal dyspnea and is out of bed by 8 AM feeling tired without dryness of mouth or headaches  There is no history suggestive of cataplexy, sleep paralysis or parasomnias  On ambulation, she desaturated from 94% to 90% with one lap with heart rate increasing from 61-82 and ambulation was stopped due to pressure in her chest  Significant tests/ events reviewed  PFT- 12/2010- Mild restriction and reduction of DLCO. FEV1/FVC 0.86, TLC 68%; DLCO 60%.      Past Medical History:  Diagnosis Date  . Abnormal liver function     in the past.  . Anemia   . Arthritis    all over  . Bruises easily   . Cataract    right eye;immature  . Chronic back pain    stenosis  . Chronic cough   . Chronic kidney disease   . COPD (chronic obstructive pulmonary disease) ([place])   . Demand myocardial infarction [facility]) 2012   Demand Infarction in setting of Pancreatitis --> mild Troponin elevation, Non-obstructive CAD  . Depression    takes Abilify daily as well as Zoloft  . Diastolic heart failure 9924   Grade 1 diastolic Dysfunction by Echo   . Diverticulosis   . DM (diabetes mellitus) ([place])    takes Victoza daily  as well as Lantus and Humalog  . Empty sella ([place])    on MRI in 2009.  . Glaucoma   . Headache(784.0)    last migraine-4-4yrs ago  . History of blood transfusion    no abnormal reaction noted  . History of colon polyps    benign  . HTN (hypertension)    takes [employer] daily  . Hyperlipidemia    takes Lipitor daily  . Joint swelling   . Nocturia   . Obstructive sleep apnea   . Pancreatitis    takes Pancrelipase daily  . Parkinson's disease ([place])    takes Sinemet daily  . Peripheral neuropathy   . Pneumonia 2012  . Seizures ([place])    takes Lamictal daily and Primidone nightly;last seizure  2wks ago  . Urinary urgency    With increased frequency  . Varicose veins of both lower extremities with pain    With edema.  Takes daily Lasix    Past Surgical History:  Procedure Laterality Date  . ABDOMINAL HYSTERECTOMY    . APPENDECTOMY    . BACK SURGERY    . CARDIAC CATHETERIZATION  2009/March 2012   2009: (Dr. Alvie Heidelberg) Nonobstructive CAD; 2012: Minimal CAD --> false positive stress test  . Cardiac Event Monitor  September-October 2017   Sinus rhythm with occasional PACs and artifact. No arrhythmias besides one short run of tachycardia.  . CHOLECYSTECTOMY    . COLONOSCOPY N/A 08/15/2012   Procedure: COLONOSCOPY;  Surgeon: Arta Silence, MD;  Location: [facility];  Service: Endoscopy;  Laterality: N/A;  . COLONOSCOPY WITH PROPOFOL Left 06/17/2017   Procedure: COLONOSCOPY WITH PROPOFOL;  Surgeon: [name], MD;  Location: [facility];  Service: Gastroenterology;  Laterality: Left;  . ESOPHAGOGASTRODUODENOSCOPY    . eye cysts Bilateral   . LASIK    . LEFT HEART CATH AND CORONARY ANGIOGRAPHY N/A 05/16/2018   Procedure: LEFT HEART CATH AND CORONARY ANGIOGRAPHY;  Surgeon: Belva Crome, MD;  Location: [place] [facility];  Service: Cardiovascular;  Laterality: N/A;  . NM MYOVIEW LTD  01/2016; 01/2018   a)LOW RISK. No ischemia or infarction;; b) EF 55-60%. NO ST changes.  No ischemia or Infarction. NO significant RV enlargement.  LOW RISK.  Marland Kitchen RECTAL POLYPECTOMY    . TONSILLECTOMY    . TRANSTHORACIC ECHOCARDIOGRAM  01/2016; 05/2018   A) Normal LV chamber size with mod LVH pattern. EF 50-55%. Severe LA dilation. Mod RA dilation. PAP elevated at 38 mmHg;; B) Mild concentric LVH.  EF 55-60% & no RWMA.  Grade 2 DD.  Diastolic flattening of the ventricular septum == ? elevated PAP.  Mild aortic valve calcification/sclerosis.  Mod LA dilation.  Mild RV dilation & Mod RA dilation  . TRANSTHORACIC ECHOCARDIOGRAM  10/2017; 01/2018   a) EF 55-60%.  No RWMA,  Moderate LA dilation.  Mild LA  dilation.  Peak PA pressures ~57 mmHg (moderate);;; b) EF 55-60%.  GRII DD.  Suggestion of RV volume overload with moderate RV dilation.  Severe LA and RA dilation.Marland Kitchen  PA pressure ~67%.  . TUBAL LIGATION    . vaginal cyst removed     several times    Allergies  Allergen Reactions  . Other Anaphylaxis and Rash    Bleach  . Penicillins Hives    Has patient had a PCN reaction causing immediate rash, facial/tongue/throat swelling, SOB or lightheadedness with hypotension: Yes Has patient had a PCN reaction causing severe rash involving mucus membranes or skin necrosis: No Has patient had  a PCN reaction that required hospitalization: No Has patient had a PCN reaction occurring within the last 10 years: No If all of the above answers are "NO", then may proceed with Cephalosporin use.   . Sulfa Antibiotics Hives  . Aspirin Other (See Comments)     On aspirin 81 mg - Rectal bleeding in dec 2018  . Codeine     Headache and makes the patient feel "off"    Social History   Socioeconomic History  . Marital status: Divorced    Spouse name: Not on file  . Number of children: Not on file  . Years of education: Not on file  . Highest education level: Not on file  Occupational History  . Occupation: Disabled    Comment: CNA  Social Needs  . Financial resource strain: Not on file  . Food insecurity:    Worry: Not on file    Inability: Not on file  . Transportation needs:    Medical: Not on file    Non-medical: Not on file  Tobacco Use  . Smoking status: Former Smoker    Packs/day: 0.50    Years: 25.00    Pack years: 12.50    Types: Cigarettes  . Smokeless tobacco: Never Used  . Tobacco comment: quit smoking 20+ytrs ago  Substance and Sexual Activity  . Alcohol use: No  . Drug use: No  . Sexual activity: Never    Birth control/protection: Surgical  Lifestyle  . Physical activity:    Days per week: Not on file    Minutes per session: Not on file  . Stress: Not on file    Relationships  . Social connections:    Talks on phone: Not on file    Gets together: Not on file    Attends religious service: Not on file    Active member of club or organization: Not on file    Attends meetings of clubs or organizations: Not on file    Relationship status: Not on file  . Intimate partner violence:    Fear of current or ex partner: Not on file    Emotionally abused: Not on file    Physically abused: Not on file    Forced sexual activity: Not on file  Other Topics Concern  . Not on file  Social History Narrative   She lives in [place]. She has lots of family in the area and is accompanied by her sister.   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   She has a distant history of smoking, quit 20 years ago.     Family History  Problem Relation Age of Onset  . Allergies Father   . Heart disease Father        before 5  . Heart failure Father   . Hypertension Father   . Hyperlipidemia Father   . Heart disease Mother   . Diabetes Mother   . Hypertension Mother   . Hyperlipidemia Mother   . Hypertension Sister   . Heart disease Sister        before 41  . Hyperlipidemia Sister   . Diabetes Brother   . Hypertension Brother   . Diabetes Sister   . Hypertension Sister   . Diabetes Son   . Hypertension Son   . Cancer Other       Review of Systems  Positive for shortness of breath with activity, irregular heartbeat, tooth problems, nasal congestion, anxiety depression, feet swelling  Constitutional: negative for anorexia, fevers and sweats  Eyes: negative for irritation, redness and visual disturbance  Ears, nose, mouth, throat, and face: negative for earaches, epistaxis, nasal congestion and sore throat  Respiratory: negative for cough,  sputum and wheezing  Cardiovascular: negative for chest pain,  orthopnea, palpitations and syncope  Gastrointestinal: negative for abdominal pain, constipation, diarrhea, melena,  nausea and vomiting  Genitourinary:negative for dysuria, frequency and hematuria  Hematologic/lymphatic: negative for bleeding, easy bruising and lymphadenopathy  Musculoskeletal:negative for arthralgias, muscle weakness and stiff joints  Neurological: negative for coordination problems, gait problems, headaches and weakness  Endocrine: negative for diabetic symptoms including polydipsia, polyuria and weight loss     Objective:   Physical Exam  Gen. Pleasant, obese, in no distress, normal affect ENT - no pallor,icterus, no post nasal drip, class 2-3 airway Neck: No JVD, no thyromegaly, no carotid bruits Lungs: no use of accessory muscles, no dullness to percussion, decreased without rales or rhonchi  Cardiovascular: Rhythm regular, heart sounds  normal, no murmurs or gallops, 1+ peripheral edema Abdomen: soft and non-tender, no hepatosplenomegaly, BS normal. Musculoskeletal: No deformities, no cyanosis or clubbing Neuro:  alert, non focal, no tremors       Assessment & Plan:

## 2018-06-26 NOTE — Assessment & Plan Note (Signed)
She does have daytime hypercarbia based on ABG in 2018 which would qualify for obesity hypoventilation syndrome.  She has lost weight since then and weight loss would be the primary intervention for this. She does not have significant obstructive airways disease to account for hypercarbia based on prior PFTs

## 2018-06-26 NOTE — Assessment & Plan Note (Signed)
Surprisingly degree of sleep disordered breathing seems to have decreased, she had severe OSA in 2017 when she weighed 270 pounds and now with weight loss of 20 pounds she only seems to have mild OSA on her study and 02/2018.  She did have significant supine sleep but I wonder if the study underestimates. Since she does have some degree of sleepiness and cardiovascular morbidity, I would suggest proceeding with CPAP trial to see if she feels symptomatically better  She is already scheduled for CPAP titration study by neurology so I will let them prescribe her machine.  Indicates that when I would be happy to prescribe that for her based on titration study.  I have also asked her to trial nasal pillows and nasal mask during the titration study since she has hesitation to use a full facemask

## 2018-06-27 ENCOUNTER — Ambulatory Visit (INDEPENDENT_AMBULATORY_CARE_PROVIDER_SITE_OTHER): Payer: Medicare HMO | Admitting: Neurology

## 2018-06-27 DIAGNOSIS — G4733 Obstructive sleep apnea (adult) (pediatric): Secondary | ICD-10-CM

## 2018-06-27 DIAGNOSIS — IMO0002 Reserved for concepts with insufficient information to code with codable children: Secondary | ICD-10-CM

## 2018-06-27 DIAGNOSIS — G4736 Sleep related hypoventilation in conditions classified elsewhere: Secondary | ICD-10-CM

## 2018-06-27 DIAGNOSIS — G4719 Other hypersomnia: Secondary | ICD-10-CM

## 2018-07-02 ENCOUNTER — Other Ambulatory Visit: Payer: Self-pay | Admitting: Internal Medicine

## 2018-07-02 DIAGNOSIS — F339 Major depressive disorder, recurrent, unspecified: Secondary | ICD-10-CM

## 2018-07-03 DIAGNOSIS — I69354 Hemiplegia and hemiparesis following cerebral infarction affecting left non-dominant side: Secondary | ICD-10-CM | POA: Diagnosis not present

## 2018-07-03 DIAGNOSIS — N189 Chronic kidney disease, unspecified: Secondary | ICD-10-CM | POA: Diagnosis not present

## 2018-07-03 DIAGNOSIS — M159 Polyosteoarthritis, unspecified: Secondary | ICD-10-CM | POA: Diagnosis not present

## 2018-07-03 DIAGNOSIS — I130 Hypertensive heart and chronic kidney disease with heart failure and stage 1 through stage 4 chronic kidney disease, or unspecified chronic kidney disease: Secondary | ICD-10-CM | POA: Diagnosis not present

## 2018-07-03 DIAGNOSIS — I5032 Chronic diastolic (congestive) heart failure: Secondary | ICD-10-CM | POA: Diagnosis not present

## 2018-07-03 DIAGNOSIS — J449 Chronic obstructive pulmonary disease, unspecified: Secondary | ICD-10-CM | POA: Diagnosis not present

## 2018-07-03 DIAGNOSIS — E1122 Type 2 diabetes mellitus with diabetic chronic kidney disease: Secondary | ICD-10-CM | POA: Diagnosis not present

## 2018-07-03 DIAGNOSIS — I50810 Right heart failure, unspecified: Secondary | ICD-10-CM | POA: Diagnosis not present

## 2018-07-03 DIAGNOSIS — G20 Parkinson's disease: Secondary | ICD-10-CM | POA: Diagnosis not present

## 2018-07-04 ENCOUNTER — Other Ambulatory Visit: Payer: Self-pay | Admitting: *Deleted

## 2018-07-04 NOTE — Patient Outreach (Signed)
[facility]) Care Management  07/04/2018   [name] July 15, 1945 [id number]  RN Health Coach telephone call to patient.  [employer] compliance verified.  Per patient she is weighing daily. Patient stated that her weight fluctuates. RN discussed with patient about her diet. Per patient she doesn't add any salt to foods. RN asked about snacks. Patient stated that she does have a weakness for potato chips. RN explained that is a lot of sodium on them. RN discussed unsalted chips for patient. Per patient she is taking her lasix as per ordered. Patient had a recent sleep apnea test and is waiting on results. RN discussed the benefits of the CPAP and that it helps to reduce the stress on the heart and blood flow to brain when sleeping that occurs with lack of oxygen. Patient has agreed to further outreach calls.   Current Medications:  Current Outpatient Medications  Medication Sig Dispense Refill  . acetaminophen (TYLENOL) 500 MG tablet Take 500 mg by mouth every 4 (four) hours as needed (for pain).     Marland Kitchen albuterol (PROVENTIL HFA;VENTOLIN HFA) 108 (90 Base) MCG/ACT inhaler Inhale 1-2 puffs into the lungs every 6 (six) hours as needed for wheezing or shortness of breath (DOE WHEEZING SOB). 1 Inhaler 1  . atorvastatin (LIPITOR) 10 MG tablet Take 1 tablet (10 mg total) by mouth daily. 30 tablet 0  . B Complex-C (B-COMPLEX WITH VITAMIN C) tablet Take 1 tablet by mouth daily.    . Blood Glucose Monitoring Suppl (ACCU-CHEK NANO SMARTVIEW) W/DEVICE KIT Use to check blood sugar 3 times per day dx code E11.65 1 kit 0  . carvedilol (COREG) 25 MG tablet TAKE 1 TABLET BY MOUTH TWICE DAILY (Patient taking differently: Take 25 mg by mouth 2 (two) times daily with a meal. ) 180 tablet 1  . Cholecalciferol (VITAMIN D-3) 1000 units CAPS Take 1,000 Units by mouth daily.    . diclofenac sodium (VOLTAREN) 1 % GEL Apply 2 g topically 4 (four) times daily as needed (pain).    . DROPLET PEN NEEDLES 31G X  8 MM MISC USE DAILY WITH VICTOZA AND/OR NOVOLOG.  400 each 1  . furosemide (LASIX) 80 MG tablet Take 0.5-1 tablets (40-80 mg total) by mouth See admin instructions. Take 80 mg M--F, take 40 mg all other days    . hydrALAZINE (APRESOLINE) 50 MG tablet Take 1 tablet (50 mg total) by mouth 3 (three) times daily. 90 tablet 3  . isosorbide mononitrate (IMDUR) 60 MG 24 hr tablet Take 1 tablet (60 mg total) by mouth daily. 90 tablet 3  . lamoTRIgine (LAMICTAL) 100 MG tablet Take 1 tablet (100 mg total) by mouth 2 (two) times daily. 180 tablet 3  . Menthol, Topical Analgesic, 3.5 % GEL Apply 1 application topically as needed (for neuropathy in feet). 120 mL 0  . metolazone (ZAROXOLYN) 2.5 MG tablet Take 1 tablet (2.5 mg total) by mouth See admin instructions. Take 1 tablet Monday and Friday only if weight is greater or equal to 269 10 tablet 0  . Multiple Vitamins-Minerals (MULTIVITAMIN WITH MINERALS) tablet Take 1 tablet by mouth daily.    . Pancrelipase, Lip-Prot-Amyl, (ZENPEP) 25000-79000 units CPEP Take 1-2 capsules by mouth See admin instructions. Take 2 capsules with meals and 1 capsule with snacks    . potassium chloride SA (K-DUR,KLOR-CON) 20 MEQ tablet Take 2 tablets by mouth daily. On the day you take metolazone, take extra tablet for a total of 3 (82mq) 70 tablet  11  . pregabalin (LYRICA) 100 MG capsule Take 1 capsule (100 mg total) by mouth 2 (two) times daily. 60 capsule 0  . Semaglutide, 1 MG/DOSE, (OZEMPIC, 1 MG/DOSE,) 2 MG/1.5ML SOPN Inject 1 mg into the skin once a week. 3 pen 1  . sertraline (ZOLOFT) 50 MG tablet TAKE 1 TABLET EVERY DAY 90 tablet 2   No current facility-administered medications for this visit.     Functional Status:  In your present state of health, do you have any difficulty performing the following activities: 07/04/2018 05/15/2018  Hearing? N -  Vision? N -  Difficulty concentrating or making decisions? N -  Walking or climbing stairs? Y -  Dressing or bathing? N  -  Doing errands, shopping? Y N  Preparing Food and eating ? Y -  Using the Toilet? N -  In the past six months, have you accidently leaked urine? Y -  Do you have problems with loss of bowel control? N -  Managing your Medications? Y -  Managing your Finances? Y -  Housekeeping or managing your Housekeeping? Y -  Some recent data might be hidden    Fall/Depression Screening: Fall Risk  07/04/2018 04/24/2018 12/09/2017  Falls in the past year? 0 No No  Number falls in past yr: 0 - -  Injury with Fall? 0 - -  Risk Factor Category  - - -  Risk for fall due to : - - -  Follow up - - -   PHQ 2/9 Scores 07/04/2018 04/24/2018 11/25/2017 02/28/2017 02/15/2017 10/08/2016 09/05/2016  PHQ - 2 Score 0 2 0 0 0 0 0  PHQ- 9 Score - 4 - - - - -    Assessment:  Patient is weighing every day Patient is taking medication as per ordered Patient needs additional education on low sodium diet  Plan:  RN discussed heart failure exacerbation RN discussed the need for CPAP and benefits RN sent 2020 Calendar book for documentation Patient has follow up appointment with Dr Ellyn Hack on 07/08/2018 RN sent picture sheet on foods high and low in sodium RN sent educational material on CHF exacerbation RN will follow up within the month of February RN sent barriers letter and assessment to PCP  [facility] [phone]  .

## 2018-07-08 ENCOUNTER — Encounter: Payer: Self-pay | Admitting: Cardiology

## 2018-07-08 ENCOUNTER — Ambulatory Visit: Payer: Medicare HMO | Admitting: Cardiology

## 2018-07-08 VITALS — BP 146/64 | HR 74 | Ht 61.0 in | Wt 243.4 lb

## 2018-07-08 DIAGNOSIS — G4733 Obstructive sleep apnea (adult) (pediatric): Secondary | ICD-10-CM | POA: Diagnosis not present

## 2018-07-08 DIAGNOSIS — R609 Edema, unspecified: Secondary | ICD-10-CM

## 2018-07-08 DIAGNOSIS — I110 Hypertensive heart disease with heart failure: Secondary | ICD-10-CM

## 2018-07-08 DIAGNOSIS — G4719 Other hypersomnia: Secondary | ICD-10-CM

## 2018-07-08 DIAGNOSIS — R569 Unspecified convulsions: Secondary | ICD-10-CM | POA: Diagnosis not present

## 2018-07-08 DIAGNOSIS — E662 Morbid (severe) obesity with alveolar hypoventilation: Secondary | ICD-10-CM

## 2018-07-08 DIAGNOSIS — I83893 Varicose veins of bilateral lower extremities with other complications: Secondary | ICD-10-CM | POA: Diagnosis not present

## 2018-07-08 DIAGNOSIS — J9611 Chronic respiratory failure with hypoxia: Secondary | ICD-10-CM

## 2018-07-08 DIAGNOSIS — Z6841 Body Mass Index (BMI) 40.0 and over, adult: Secondary | ICD-10-CM

## 2018-07-08 DIAGNOSIS — I5032 Chronic diastolic (congestive) heart failure: Secondary | ICD-10-CM

## 2018-07-08 DIAGNOSIS — I2510 Atherosclerotic heart disease of native coronary artery without angina pectoris: Secondary | ICD-10-CM | POA: Diagnosis not present

## 2018-07-08 DIAGNOSIS — E785 Hyperlipidemia, unspecified: Secondary | ICD-10-CM

## 2018-07-08 MED ORDER — HYDRALAZINE HCL 50 MG PO TABS: ORAL_TABLET | ORAL | 3 refills | Status: DC

## 2018-07-08 NOTE — Patient Instructions (Signed)
Medication Instructions:   MEDICATION INSTRUCTIONS FOR HYDRALAZINE--- TAKE 2 TABLET ( TOTAL100 MG ) HYDRALAZINE  IN THE MORNING AND EVENING . TAKE 50 MG AT LUNCHTIME , IF BLOOD PRESSURE IS GREATER THAN 875 SYSTOLIC ( TOP NUMBER);TAKE AN EXTRA 50 MG AT LUNCH TIME AS WELL.  If you need a refill on your cardiac medications before your next appointment, please call your pharmacy.   Lab work: Not needed If you have labs (blood work) drawn today and your tests are completely normal, you will receive your results only by: Marland Kitchen MyChart Message (if you have MyChart) OR . A paper copy in the mail If you have any lab test that is abnormal or we need to change your treatment, we will call you to review the results.  Testing/Procedures: Not needed  Follow-Up: At [facility], you and your health needs are our priority.  As part of our continuing mission to provide you with exceptional heart care, we have created designated Provider Care Teams.  These Care Teams include your primary Cardiologist (physician) and Advanced Practice Providers (APPs -  Physician Assistants and Nurse Practitioners) who all work together to provide you with the care you need, when you need it. You will need a follow up appointment in 8 months SEPT 2020.  Please call our office 2 months in advance to schedule this appointment.  You may see Glenetta Hew, MD or one of the following Advanced Practice Providers on your designated Care Team:   Rosaria Ferries, PA-C . Jory Sims, DNP, ANP- Your physician recommends that you schedule a follow-up appointment in MAY 2020 .   Any Other Special Instructions Will Be Listed Below (If Applicable).

## 2018-07-08 NOTE — Progress Notes (Signed)
PCP: [name], MD  Clinic Note: Chief Complaint  Patient presents with  . Follow-up    Overall doing better.  . Leg Pain    cramping in legs.  . Edema    in -- stable  . Shortness of Breath    Exertion, bending down    HPI: [name] is a 73 y.o. female with a PMH (HFpEF, OSA/OHS, HTN, HLD, DM-2)  below who presents today for ~ 1 month f/u.   PMH: nonocclusive CAD by cath in 2012/2019 (nonischemic Myoview in 2017 and 2019)  DM II, HTN,  with chronic pancreatitis.   morbid obesity with obesity hypoventilation syndrome and OSA not on CPAP,   D-CHF (evidence of increased right heart pressures on echo)  History of near syncope with palpitations and PACs on monitor,   venous insufficiency contributing to lower extremity edema   CVA and seizure disorder   I had seen her in October 2019, backed off on her Lasix to 80 mg twice daily.  (At that time her weight was 252 pounds)  [name] was last seen on 05/27/2018 -Jory Sims, NP for [facility] f/u - OP Cath. Dietary indiscretion (salty foods) --> increased diuretic as directed.  Pulmonolgy Referral for likely obesity hypoventilation syndrome.  Hydralazine increased to 71m  TID (weight 252 pounds -and was maintaining steady weight at home as well.) --She is now pending sleep study evaluation for OSA (has been on QHS O2)  Recent Hospitalizations:  05/15/2018 - ER-Hosp -sharp retrosternal CP relieved with nitroglycerin.  Because of recent negative Myoview, had coronary CTA which suggested moderate plaque in the LAD and CT FFR positive --> was referred for cardiac cath (only left heart and not right heart)   Seen on January 2 by Dr. AElsworth Sohoand pulmonary medicine --noted that sleep disordered breathing seem to have improved with weight loss.  Went from severe OSA in 2017 weighing 270 pounds down to mild OSA with a weight in the 250 pound range.  He did note that she had daytime hypercarbia suggesting obesity  hypoventilation syndrome.  He question whether this would also have improved.  Studies Personally Reviewed - (if available, images/films reviewed: From Epic Chart or Care Everywhere)  Polysomnogram September 2019 (Dr. DBrett Fairyfrom neurology): Split study was not adequately done due to low AHI.  She slept reclined and had an AHI less than 10, prolonged hypoxemia and no hypercapnia. -->  She has home oxygen already prescribed, but did not meet criteria for nightly oxygen.  (Was noted that the patient did not cooperate well with study.  Titration was discussed.  Coronary CTA May 15, 2018: Coronary calcium score 71.9.  Very large/hyperdominant LAD wrapping the apex giving rise to PDA.  Moderate apical LAD plaque.  Also noted pulmonary trivial dilation suggesting elevated PA pressures.--> referred for CT FFR (mid and distal LAD 0.84.  Distal D1 0.72 suggestive of obstructive disease, distal D2 0.75 consistent with obstructive disease.  Distal circumflex 0.79. -->  Referred for cath  Cardiac Cath 05/16/2018: Normal Coronaries (on my review of images, there does appear to be a very large wraparound LAD provides the distal half of the PDA territory, a nondominant RCA with a code dominant circumflex provides the proximal portion of the PDA.  The distal OM and diagonal branches are very small caliber.  EF 70%. EDP 21 mmHg.    Interval History: CVeriareturns here today overall doing pretty well.  She says she still gets short of breath  if she tries to get going too fast.,  But is actually doing fairly well.  She has been going back and forth to visit her brother/nephew in the hospital (he is biologically the son of her sister who usually is here with her.  He was born when she was still relatively young and therefore their parents adopted him and raised the 3 of them as siblings.  Unfortunately, he is now currently on comfort care).  She says that most days she is able to walk all the way to his room, but  occasionally has to take a wheelchair.  She says every now and then she gets dizzy if she just go too fast because she is breathing hard.  She is always on the go.  She is not having any more chest pain or pressure with rest or exertion.  She has been eating less, partly because of spending so much time in the hospital and has lost a lot of weight. She discussed her pending CPAP titration evaluation, but did not go into details that I reviewed above.  She is still using her nighttime oxygen, but according to the neurology and pulmonary notes, does not necessarily meet criteria anymore as she has lost weight.  She denies any PND orthopnea, partly because she sleeps in recliner.  This is almost more because of comfort than anything else.  She still has some mild edema but pretty well controlled.  Currently taking her furosemide as directed taking 80 mg on Monday through Friday and 40 mg on Saturday and Sunday.  She is rarely having to use additional doses.  She denies any rapid irregular heartbeats palpitations.  No further chest pain or pressure with rest or exertion.  No syncope/near syncope or TIA/amaurosis fugax.  No claudication.  ROS: A comprehensive was performed. Review of Systems  Constitutional: Positive for malaise/fatigue (Definitely getting better since she has lost weight and has been exercising. --She is down yet again another 10 pounds from when I last saw her.).  HENT: Negative for congestion and nosebleeds.   Respiratory: Positive for shortness of breath (Only with exertion). Negative for cough.   Cardiovascular: Positive for leg swelling (Well-controlled with current diuretic).  Gastrointestinal: Negative for blood in stool, heartburn (Part probably the reason for sleeping in a recliner) and melena.  Genitourinary: Negative for hematuria.  Musculoskeletal: Positive for back pain and joint pain (Knees and ankles).  Neurological: Positive for dizziness (Sometimes she is orthostatic  in the morning, and also sometimes gets dizzy when she hyperventilates.) and weakness (Her legs seem to be somewhat weak partly because her knees hurt.).  Psychiatric/Behavioral: Negative for memory loss. The patient is not nervous/anxious and does not have insomnia (As long she sleeps sitting up she does not have any real issues.).        She seems to be handling her adopted brother/nephews impending death relatively well.  She seems to be the one who is holding it strong and her sister is actually taken it worse (probably because she is biologically his mother).  She is eager to get back to see him now MD with her sister..  I have reviewed and (if needed) personally updated the patient's problem list, medications, allergies, past medical and surgical history, social and family history.   Past Medical History:  Diagnosis Date  . Abnormal liver function     in the past.  . Anemia   . Arthritis    all over  . Bruises easily   .  Cataract    right eye;immature  . Chronic back pain    stenosis  . Chronic cough   . Chronic kidney disease   . COPD (chronic obstructive pulmonary disease) ([place])   . Demand myocardial infarction [facility]) 2012   Demand Infarction in setting of Pancreatitis --> mild Troponin elevation, NON-OBSTRUCTIVE CAD  . Depression    takes Abilify daily as well as Zoloft  . Diastolic heart failure 7915   Grade 1 diastolic Dysfunction by Echo   . Diverticulosis   . DM (diabetes mellitus) ([place])    takes Victoza daily as well as Lantus and Humalog  . Empty sella ([place])    on MRI in 2009.  . Glaucoma   . Headache(784.0)    last migraine-4-69yr ago  . History of blood transfusion    no abnormal reaction noted  . History of colon polyps    benign  . HTN (hypertension)    takes BKinder Morgan Energydaily  . Hyperlipidemia    takes Lipitor daily  . Joint swelling   . Nocturia   . Obesity hypoventilation syndrome ([place]   . Obstructive sleep apnea 02/2018   Notably  improved split-night study with weight loss from 270 (2017) down to 250 pounds (2019).  . Pancreatitis    takes Pancrelipase daily  . Parkinson's disease ([place]    takes Sinemet daily  . Peripheral neuropathy   . Pneumonia 2012  . Seizures ([place]    takes Lamictal daily and Primidone nightly;last seizure 2wks ago  . Urinary urgency    With increased frequency  . Varicose veins of both lower extremities with pain    With edema.  Takes daily Lasix    Past Surgical History:  Procedure Laterality Date  . ABDOMINAL HYSTERECTOMY    . APPENDECTOMY    . BACK SURGERY    . CARDIAC CATHETERIZATION  2009/March 2012   2009: (Dr. KAlvie Heidelberg Nonobstructive CAD; 2012: Minimal CAD --> false positive stress test  . Cardiac Event Monitor  September-October 2017   Sinus rhythm with occasional PACs and artifact. No arrhythmias besides one short run of tachycardia.  . CHOLECYSTECTOMY    . COLONOSCOPY N/A 08/15/2012   Procedure: COLONOSCOPY;  Surgeon: WArta Silence MD;  Location: [facility];  Service: Endoscopy;  Laterality: N/A;  . COLONOSCOPY WITH PROPOFOL Left 06/17/2017   Procedure: COLONOSCOPY WITH PROPOFOL;  Surgeon: KRonnette Juniper MD;  [place]  Service: Gastroenterology;  Laterality: Left;  . CORONARY CALCIUM SCORE AND CTA  04/2018   Coronary calcium score 71.9.  Very large, hyperdynamic LAD wrapping apex giving rise to PDA.  Moderate LAD-diagonal and circumflex plaque -> CT FFR suggested positive findings in distal D1, D2 and distal circumflex.  Referred for cath. ==> FALSE POSITIVE  . ESOPHAGOGASTRODUODENOSCOPY    . eye cysts Bilateral   . LASIK    . LEFT HEART CATH AND CORONARY ANGIOGRAPHY N/A 05/16/2018   Procedure: LEFT HEART CATH AND CORONARY ANGIOGRAPHY;  Surgeon: SBelva Crome MD;  Location: [facility] INVASIVE CV LAB:  Angiographically normal coronary arteries with LVEDP of 21 mmHg.  Large draping hyperdominant LAD that wraps the apex and provides distal half of the PDA.   Relatively small caliber distal Cx -->proxLPDA, non-dom RCA. No Cx or Diag lesions -- FALSE + CT FFR  . NM MYOVIEW LTD  01/2016; 01/2018   a)LOW RISK. No ischemia or infarction;; b) EF 55-60%. NO ST changes.  No ischemia or Infarction. NO significant RV enlargement.  LOW RISK.  .Marland Kitchen  RECTAL POLYPECTOMY    . TONSILLECTOMY    . TRANSTHORACIC ECHOCARDIOGRAM  01/2016; 05/2017   A) Normal LV chamber size with mod LVH pattern. EF 50-55%. Severe LA dilation. Mod RA dilation. PAP elevated at 38 mmHg;; B) Mild concentric LVH.  EF 55-60% & no RWMA.  Grade 2 DD.  Diastolic flattening of the ventricular septum == ? elevated PAP.  Mild aortic valve calcification/sclerosis.  Mod LA dilation.  Mild RV dilation & Mod RA dilation  . TRANSTHORACIC ECHOCARDIOGRAM  10/2017; 01/2018   a) EF 55-60%.  No RWMA,  Moderate LA dilation.  Mild LA dilation.  Peak PA pressures ~57 mmHg (moderate);;; b) EF 55-60%.  GRII DD.  Suggestion of RV volume overload with moderate RV dilation.  Severe LA and RA dilation.Marland Kitchen  PA pressure ~67%.  . TUBAL LIGATION    . vaginal cyst removed     several times    Current Meds  Medication Sig  . acetaminophen (TYLENOL) 500 MG tablet Take 500 mg by mouth every 4 (four) hours as needed (for pain).   Marland Kitchen albuterol (PROVENTIL HFA;VENTOLIN HFA) 108 (90 Base) MCG/ACT inhaler Inhale 1-2 puffs into the lungs every 6 (six) hours as needed for wheezing or shortness of breath (DOE WHEEZING SOB).  Marland Kitchen atorvastatin (LIPITOR) 10 MG tablet Take 1 tablet (10 mg total) by mouth daily.  . B Complex-C (B-COMPLEX WITH VITAMIN C) tablet Take 1 tablet by mouth daily.  . Blood Glucose Monitoring Suppl (ACCU-CHEK NANO SMARTVIEW) W/DEVICE KIT Use to check blood sugar 3 times per day dx code E11.65  . carvedilol (COREG) 25 MG tablet TAKE 1 TABLET BY MOUTH TWICE DAILY (Patient taking differently: Take 25 mg by mouth 2 (two) times daily with a meal. )  . Cholecalciferol (VITAMIN D-3) 1000 units CAPS Take 1,000 Units by mouth daily.   . diclofenac sodium (VOLTAREN) 1 % GEL Apply 2 g topically 4 (four) times daily as needed (pain).  . DROPLET PEN NEEDLES 31G X 8 MM MISC USE DAILY WITH VICTOZA AND/OR NOVOLOG.   . furosemide (LASIX) 80 MG tablet Take 0.5-1 tablets (40-80 mg total) by mouth See admin instructions. Take 80 mg M--F, take 40 mg all other days  . isosorbide mononitrate (IMDUR) 60 MG 24 hr tablet Take 1 tablet (60 mg total) by mouth daily.  Marland Kitchen lamoTRIgine (LAMICTAL) 100 MG tablet Take 1 tablet (100 mg total) by mouth 2 (two) times daily.  . Menthol, Topical Analgesic, 3.5 % GEL Apply 1 application topically as needed (for neuropathy in feet).  . metolazone (ZAROXOLYN) 2.5 MG tablet Take 1 tablet (2.5 mg total) by mouth See admin instructions. Take 1 tablet Monday and Friday only if weight is greater or equal to 269  . Multiple Vitamins-Minerals (MULTIVITAMIN WITH MINERALS) tablet Take 1 tablet by mouth daily.  . Pancrelipase, Lip-Prot-Amyl, (ZENPEP) 25000-79000 units CPEP Take 1-2 capsules by mouth See admin instructions. Take 2 capsules with meals and 1 capsule with snacks  . potassium chloride SA (K-DUR,KLOR-CON) 20 MEQ tablet Take 2 tablets by mouth daily. On the day you take metolazone, take extra tablet for a total of 3 (66mq)  . pregabalin (LYRICA) 100 MG capsule Take 1 capsule (100 mg total) by mouth 2 (two) times daily.  . Semaglutide, 1 MG/DOSE, (OZEMPIC, 1 MG/DOSE,) 2 MG/1.5ML SOPN Inject 1 mg into the skin once a week.  . sertraline (ZOLOFT) 50 MG tablet TAKE 1 TABLET EVERY DAY  . [DISCONTINUED] hydrALAZINE (APRESOLINE) 50 MG tablet Take 1 tablet (  50 mg total) by mouth 3 (three) times daily.    Allergies  Allergen Reactions  . Other Anaphylaxis and Rash    Bleach  . Penicillins Hives    Has patient had a PCN reaction causing immediate rash, facial/tongue/throat swelling, SOB or lightheadedness with hypotension: Yes Has patient had a PCN reaction causing severe rash involving mucus membranes or skin  necrosis: No Has patient had a PCN reaction that required hospitalization: No Has patient had a PCN reaction occurring within the last 10 years: No If all of the above answers are "NO", then may proceed with Cephalosporin use.   . Sulfa Antibiotics Hives  . Aspirin Other (See Comments)     On aspirin 81 mg - Rectal bleeding in dec 2018  . Codeine     Headache and makes the patient feel "off"    Social History   Tobacco Use  . Smoking status: Former Smoker    Packs/day: 0.50    Years: 25.00    Pack years: 12.50    Types: Cigarettes  . Smokeless tobacco: Never Used  . Tobacco comment: quit smoking 20+ytrs ago  Substance Use Topics  . Alcohol use: No  . Drug use: No   Social History   Social History Narrative   She lives in [place]. She has lots of family in the area and is accompanied by her sister.   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   She has a distant history of smoking, quit 20 years ago.    family history includes Allergies in her father; Cancer in an other family member; Diabetes in her brother, mother, sister, and son; Heart disease in her father, mother, and sister; Heart failure in her father; Hyperlipidemia in her father, mother, and sister; Hypertension in her brother, father, mother, sister, sister, and son.  Wt Readings from Last 3 Encounters:  07/08/18 243 lb 6.4 oz (110.4 kg)  06/26/18 244 lb 6.4 oz (110.9 kg)  05/27/18 252 lb 12.8 oz (114.7 kg)    PHYSICAL EXAM BP (!) 146/64   Pulse 74   Ht _0  (1.549 m)   Wt 243 lb 6.4 oz (110.4 kg)   BMI 45.99 kg/m  Physical Exam  Constitutional: She is oriented to person, place, and time. She appears well-developed and well-nourished. No distress.  Noticeably lower weight today.  Remains morbidly obese.  Well-groomed.  She does have some chronic disability noted with valgus feet/ankles etc.   HENT:  Head: Normocephalic and atraumatic.  Neck: Hepatojugular reflux (8-10  cm water.) present. No JVD present. Carotid bruit is not present.  Cardiovascular: Normal rate, regular rhythm, S1 normal and S2 normal. PMI is not displaced (Cannot palpate). Exam reveals distant heart sounds and decreased pulses (Mildly decreased pedal pulses because of body habitus.). Exam reveals no gallop and no friction rub.  Murmur (2/6 HSM heard along the sternal border and apex.  More prominent toward the right sternal border than left.) heard. Pulmonary/Chest: Effort normal. No respiratory distress. She has no wheezes. She exhibits tenderness (Parasternal).  Distant breath sounds, may be mild basal crackles (they clear with cough) but no rales or rhonchi.  Abdominal: Soft. Bowel sounds are normal. She exhibits no distension. There is no abdominal tenderness.  Musculoskeletal: Normal range of motion.        General: Deformity (Her feet splayed out in a valgus formation.) and edema (Trace bilateral lower extremity with mild venous stasis changes.  More puffy swelling than  true edema) present.  Neurological: She is alert and oriented to person, place, and time.  Psychiatric: She has a normal mood and affect. Her behavior is normal. Judgment and thought content normal.  Slightly sad today discussing her "brother "  Vitals reviewed.   Adult ECG Report Not checked   Other studies Reviewed: Additional studies/ records that were reviewed today include:  Recent Labs:   Lab Results  Component Value Date   CREATININE 0.94 05/17/2018   BUN 9 05/17/2018   NA 136 05/17/2018   K 3.5 05/17/2018   CL 98 05/17/2018   CO2 29 05/17/2018   Lab Results  Component Value Date   CHOL 137 05/15/2018   HDL 60 05/15/2018   LDLCALC 53 05/15/2018   LDLDIRECT 66.0 01/27/2015   TRIG 120 05/15/2018   CHOLHDL 2.3 05/15/2018    ASSESSMENT / PLAN: Problem List Items Addressed This Visit    Chronic respiratory failure with hypoxia ([place])    Not nearly as hypoxic now per pulmonology.  Only dropped to  90% on ambulation.  No longer qualifies for portable oxygen this point.      Coronary artery disease, non-occlusive (Chronic)    I do not know how to explain the CT findings with exception of knowing that that there is tapering of vessels to the distal branches.  Probably an over read of these titrated vessels as there was no evidence of CAD noted.  The distal vessels are very small.  In the future, we need to trust the Myoview would not do another coronary CTA. ->  Her chest pain is potentially related to RV volume overload but most likely not cardiac in nature.      Relevant Medications   hydrALAZINE (APRESOLINE) 50 MG tablet   Dyslipidemia, goal LDL below 100 (Chronic)   Relevant Medications   hydrALAZINE (APRESOLINE) 50 MG tablet   Excessive daytime sleepiness   Hypertensive heart disease with chronic diastolic congestive heart failure (HCC) - Primary (Chronic)    Stable EF with diastolic dysfunction, but this is way out of proportion to the extent of RV volume overload. The RV volume overload is probably more related to obesity hypoventilation syndrome. She is on a stable dose of diuretic.  Blood pressure still high on current meds.  She is on max dose carvedilol. I will titrate up hydralazine to equivalent of 100 mg 3 times daily but will do 50 mg for the midday dose and allow for additional 50 mg as needed elevated blood pressure.  Would like to convert her back to ACE inhibitor or ARB in the future.      Relevant Medications   hydrALAZINE (APRESOLINE) 50 MG tablet   Morbid obesity with BMI of 45.0-49.9, adult (HCC) (Chronic)    She is lost yet again another 10 pounds from last visit.  This is more than 20 pounds over the past year.  I congratulated her on her efforts.  She is now 30 pounds down from what she was in 2017.  As such, her OSA and likely OHS is improved.  Continue to watch diet and continue to work on exercise.  The more she does, the better she will feel.       Obesity hypoventilation syndrome (HCC) (Chronic)    Noted to have daytime hypercalciuria.  Also has pretty significant RV volume overload on her echocardiogram back in August. If she remains stable and follow-up visit, I do agree that checking an echocardiogram and a euvolemic state will be  helpful.  Unfortunately, she had her left heart cath, but the dilated pulmonary arteries noted on CT scanning along with a dilated RV noted on echo were not evaluated with a right heart cath.  I do not think it is reasonable at this point with her feeling better to go back for an invasive procedure, but a follow-up echo at the time of next visit will be reasonable.      OSA (obstructive sleep apnea) - not on CPAP (Chronic)    Currently undergoing CPAP titration with neurology.  It seems like her OSA has improved and she may actually not make requirements for CPAP. Has difficulty with split-night studies apparently.  I will defer to neurology and pulmonology.      Peripheral edema (Chronic)    Stable and improved.  Probably related to some elevated right heart pressures as well as venous stasis. On stable dose of diuretic.  No change for now.      Varicose veins of bilateral lower extremities with other complications (Chronic)    Continue support stockings and foot elevation. Continue diuretic with current regimen.      Relevant Medications   hydrALAZINE (APRESOLINE) 50 MG tablet      I spent a total of 30 minutes with the patient and chart review. >  50% of the time was spent in direct patient consultation.   Current medicines are reviewed at length with the patient today.  (+/- concerns) - stable Lasix dose The following changes have been made:  see below -titrating up hydralazine.  Patient Instructions  Medication Instructions:   MEDICATION INSTRUCTIONS FOR HYDRALAZINE--- TAKE 2 TABLET ( TOTAL100 MG ) HYDRALAZINE  IN THE MORNING AND EVENING . TAKE 50 MG AT LUNCHTIME , IF BLOOD PRESSURE IS  GREATER THAN 419 SYSTOLIC ( TOP NUMBER);TAKE AN EXTRA 50 MG AT LUNCH TIME AS WELL.  If you need a refill on your cardiac medications before your next appointment, please call your pharmacy.   Lab work: Not needed If you have labs (blood work) drawn today and your tests are completely normal, you will receive your results only by: Marland Kitchen MyChart Message (if you have MyChart) OR . A paper copy in the mail If you have any lab test that is abnormal or we need to change your treatment, we will call you to review the results.  Testing/Procedures: Not needed  Follow-Up: At [facility], you and your health needs are our priority.  As part of our continuing mission to provide you with exceptional heart care, we have created designated Provider Care Teams.  These Care Teams include your primary Cardiologist (physician) and Advanced Practice Providers (APPs -  Physician Assistants and Nurse Practitioners) who all work together to provide you with the care you need, when you need it. You will need a follow up appointment in 8 months SEPT 2020.  Please call our office 2 months in advance to schedule this appointment.  You may see Glenetta Hew, MD or one of the following Advanced Practice Providers on your designated Care Team:   Rosaria Ferries, PA-C . Jory Sims, DNP, ANP- Your physician recommends that you schedule a follow-up appointment in MAY 2020 .   Any Other Special Instructions Will Be Listed Below (If Applicable).     Studies Ordered:   No orders of the defined types were placed in this encounter.     Glenetta Hew, M.D., M.S. Interventional Cardiologist   Pager # [phone] Phone # [phone] [address]  [place], [place] [address]   Thank you for choosing [facility]!!    

## 2018-07-09 DIAGNOSIS — J9621 Acute and chronic respiratory failure with hypoxia: Secondary | ICD-10-CM | POA: Diagnosis not present

## 2018-07-09 DIAGNOSIS — I5032 Chronic diastolic (congestive) heart failure: Secondary | ICD-10-CM | POA: Diagnosis not present

## 2018-07-09 DIAGNOSIS — R0602 Shortness of breath: Secondary | ICD-10-CM | POA: Diagnosis not present

## 2018-07-09 DIAGNOSIS — G4089 Other seizures: Secondary | ICD-10-CM | POA: Diagnosis not present

## 2018-07-09 DIAGNOSIS — I509 Heart failure, unspecified: Secondary | ICD-10-CM | POA: Diagnosis not present

## 2018-07-09 DIAGNOSIS — M48061 Spinal stenosis, lumbar region without neurogenic claudication: Secondary | ICD-10-CM | POA: Diagnosis not present

## 2018-07-09 DIAGNOSIS — I10 Essential (primary) hypertension: Secondary | ICD-10-CM | POA: Diagnosis not present

## 2018-07-09 DIAGNOSIS — I5030 Unspecified diastolic (congestive) heart failure: Secondary | ICD-10-CM | POA: Diagnosis not present

## 2018-07-09 DIAGNOSIS — G4733 Obstructive sleep apnea (adult) (pediatric): Secondary | ICD-10-CM | POA: Diagnosis not present

## 2018-07-10 ENCOUNTER — Encounter: Payer: Self-pay | Admitting: Cardiology

## 2018-07-11 DIAGNOSIS — G4733 Obstructive sleep apnea (adult) (pediatric): Secondary | ICD-10-CM | POA: Insufficient documentation

## 2018-07-11 NOTE — Assessment & Plan Note (Signed)
Not nearly as hypoxic now per pulmonology.  Only dropped to 90% on ambulation.  No longer qualifies for portable oxygen this point.

## 2018-07-11 NOTE — Assessment & Plan Note (Signed)
Noted to have daytime hypercalciuria.  Also has pretty significant RV volume overload on her echocardiogram back in August. If she remains stable and follow-up visit, I do agree that checking an echocardiogram and a euvolemic state will be helpful.  Unfortunately, she had her left heart cath, but the dilated pulmonary arteries noted on CT scanning along with a dilated RV noted on echo were not evaluated with a right heart cath.  I do not think it is reasonable at this point with her feeling better to go back for an invasive procedure, but a follow-up echo at the time of next visit will be reasonable.

## 2018-07-11 NOTE — Assessment & Plan Note (Signed)
She is lost yet again another 10 pounds from last visit.  This is more than 20 pounds over the past year.  I congratulated her on her efforts.  She is now 30 pounds down from what she was in 2017.  As such, her OSA and likely OHS is improved.  Continue to watch diet and continue to work on exercise.  The more she does, the better she will feel.

## 2018-07-11 NOTE — Assessment & Plan Note (Signed)
Stable and improved.  Probably related to some elevated right heart pressures as well as venous stasis. On stable dose of diuretic.  No change for now.

## 2018-07-11 NOTE — Assessment & Plan Note (Signed)
Stable EF with diastolic dysfunction, but this is way out of proportion to the extent of RV volume overload. The RV volume overload is probably more related to obesity hypoventilation syndrome. She is on a stable dose of diuretic.  Blood pressure still high on current meds.  She is on max dose carvedilol. I will titrate up hydralazine to equivalent of 100 mg 3 times daily but will do 50 mg for the midday dose and allow for additional 50 mg as needed elevated blood pressure.  Would like to convert her back to ACE inhibitor or ARB in the future.

## 2018-07-11 NOTE — Procedures (Signed)
PATIENT'S NAME:  [name], [place]gwood DOB:      [birth year]      MR#:    [id number]     DATE OF RECORDING: 06/27/2018 [facility] REFERRING M.D.:  [name], MD, Glenetta Hew, MD Study Performed:   CPAP  Titration RECOMMENDATIONS: Advise full-night, attended, CPAP titration study following PS form 03-04-2018 which had the following results:   Mild Obstructive Sleep Hypopnea / without frank apnea (OSA) AHI was 7.4/h. REM accentuated AHI of REM 21.8h. Prolonged sleep hypoxemia of 166 minutes - but no hypercapnia ( Co2 Max 44.7 torr) . This PSG study was performed without oxygen being supplemented.  The patient endorsed the Epworth Sleepiness Scale at 19/24 points.   The patient's weight 259 pounds with a height of 61 (inches), resulting in a BMI of 48.7 kg/m2. The patient's neck circumference measured 17 inches.  CURRENT MEDICATIONS: Tylenol, Lipitor, Coreg, Questran, Voltaren, Lasix, Lamictal, Metolazone, Klorcon, Lyrica, Ozempic, Zoloft, Imdur   PROCEDURE:  This is a multichannel digital polysomnogram utilizing the SomnoStar 11.2 system.  Electrodes and sensors were applied and monitored per AASM Specifications.   EEG, EOG, Chin and Limb EMG, were sampled at 200 Hz.  ECG, Snore and Nasal Pressure, Thermal Airflow, Respiratory Effort, CPAP Flow and Pressure, Oximetry was sampled at 50 Hz. Digital video and audio were recorded.      CPAP was initiated at 5 cmH20 with heated humidity per AASM split night standards and pressure was advanced to 9 cmH20 because of hypopneas, apneas and desaturations.  At a PAP pressure of 9 cmH20, there was a reduction of the AHI to 0.0/h for 54 minutes of sleep with resolution of sleep apnea. The nadir remained at 89%. Lights Out was at 22:19 and Lights On at 04:55. Total recording time (TRT) was 396.5 minutes, with a total sleep time (TST) of 375.5 minutes. The patient's sleep latency was 15 minutes. REM latency was 100 minutes.  The sleep efficiency was 94.7 %.    SLEEP  ARCHITECTURE: WASO (Wake after sleep onset) was 8 minutes.  There were 2.5 minutes in Stage N1, 188.5 minutes Stage N2, 109 minutes Stage N3 and 75.5 minutes in Stage REM.  The percentage of Stage N1 was .7%, Stage N2 was 70.2%, Stage N3 was 19.0% and Stage R (REM sleep) was 10.1%.   RESPIRATORY ANALYSIS:  There was a total of 16 respiratory events: 4 obstructive apneas, 3 central apneas and 0 mixed apneas with a total of 7 apneas and an apnea index (AI) of 1.1 /hour. There were 9 hypopneas with a hypopnea index of 1.4/hour. The patient also had 0 respiratory event related arousals (RERAs).     The total APNEA/HYPOPNEA INDEX (AHI) was 2.6 /hour and the total RESPIRATORY DISTURBANCE INDEX was 2.6 /hour  5 events occurred in REM sleep and 11 events in NREM. The REM AHI was 4. /hour versus a non-REM AHI of 2.2 /hour.  The patient spent 375.5 minutes of total sleep time in the supine position and 0 minutes in non-supine. The supine AHI was 2.5, versus a non-supine AHI of 0.0.  OXYGEN SATURATION & C02:  The baseline 02 saturation was 92%, with the lowest being 82%. Time spent below 89% saturation equaled 51 minutes.  AROUSALS/ PERIODIC LIMB MOVEMENTS: The arousals were noted as: 37 were spontaneous, 11 were associated with PLMs, and 3 were associated with respiratory events.  The patient had a total of 34 Periodic Limb Movements. The Periodic Limb Movement (PLM) index was 5.4 and the  PLM Arousal index was 1.8 /hour.  Audio and video analysis did not show any abnormal or unusual movements, behaviors, phonations or vocalizations. She slept on a wedge and supine all night.    The patient took no bathroom breaks. EKG was in keeping with normal sinus rhythm.  Post-study, the patient indicated that sleep was better than usual.  The patient was fitted with a chin strap and a p 10 nasal pillow mask. I will ask her to obtain a Bella Swift medium nasal pillow with the possible use of ear loops.    DIAGNOSIS 1. Obstructive Sleep Apnea responded well to CPAP 9 cm water- and hypoxemia was sufficiently controlled as well.  2. normal EKG with isolated PVC in REM   PLANS/RECOMMENDATIONS: 1. Achieve and maintain lean body weight. Consider Medical evaluation of your metabolic rate and weight management. 2. Compliance with CPAP therapy is defined as 4 hours or more of nightly use.  3. The patient should avoid sedatives, hypnotics, and alcoholic beverage consumption before bedtime.    DISCUSSION: I have written an order for CPAP therapy to begin.    A follow up appointment will be scheduled in the Sleep Clinic at [facility].   Please call [phone] with any questions.     I certify that I have reviewed the entire raw data recording prior to the issuance of this report in accordance with the Standards of Accreditation of the American Academy of Sleep Medicine (AASM)   Larey Seat, M.D.   07-11-2018 Diplomat, American Board of Psychiatry and Neurology  Diplomat, [place] [employer] Medical Director, [place] [facility] at [facility]

## 2018-07-11 NOTE — Assessment & Plan Note (Signed)
Currently undergoing CPAP titration with neurology.  It seems like her OSA has improved and she may actually not make requirements for CPAP. Has difficulty with split-night studies apparently.  I will defer to neurology and pulmonology.

## 2018-07-11 NOTE — Addendum Note (Signed)
Addended by: Larey Seat on: 07/11/2018 01:31 PM   Modules accepted: Orders

## 2018-07-11 NOTE — Assessment & Plan Note (Signed)
I do not know how to explain the CT findings with exception of knowing that that there is tapering of vessels to the distal branches.  Probably an over read of these titrated vessels as there was no evidence of CAD noted.  The distal vessels are very small.  In the future, we need to trust the [facility] would not do another coronary CTA. ->  Her chest pain is potentially related to RV volume overload but most likely not cardiac in nature.

## 2018-07-11 NOTE — Assessment & Plan Note (Signed)
Continue support stockings and foot elevation. Continue diuretic with current regimen.

## 2018-07-14 ENCOUNTER — Telehealth: Payer: Self-pay | Admitting: Pulmonary Disease

## 2018-07-14 ENCOUNTER — Telehealth: Payer: Self-pay | Admitting: Neurology

## 2018-07-14 NOTE — Telephone Encounter (Signed)
I called pt. I advised pt that Dr. Brett Fairy reviewed their sleep study results and found that pt was able to tolerate and was treated with a CPAP at a pressure of 9 cm water pressure. Dr. Brett Fairy recommends that pt starts auto CPAP 6-11cm water pressure. I reviewed PAP compliance expectations with the pt. Pt is agreeable to starting a CPAP. I advised pt that an order will be sent to a DME, [facility], and [facility] will call the pt within about one week after they file with the pt's insurance. [facility] will show the pt how to use the machine, fit for masks, and troubleshoot the CPAP if needed. A follow up appt was made for insurance purposes with Dan Humphreys, NP on March 25,2020 at 10:45 am. Pt verbalized understanding to arrive 15 minutes early and bring their CPAP. A letter with all of this information in it will be mailed to the pt as a reminder. I verified with the pt that the address we have on file is correct. Pt verbalized understanding of results. Pt had no questions at this time but was encouraged to call back if questions arise. I have sent the order to [facility] and have received confirmation that they have received the order.

## 2018-07-14 NOTE — Telephone Encounter (Signed)
ATC, Line busy

## 2018-07-15 NOTE — Telephone Encounter (Signed)
Attempted to call Patient.  Line busy, unable to leave message.

## 2018-07-16 NOTE — Telephone Encounter (Signed)
LMTCB. Per phone note from [facility] patient has been set up with cpap at [place].

## 2018-07-17 NOTE — Telephone Encounter (Signed)
ATC Patient.  Line busy, unable to leave message.

## 2018-07-18 NOTE — Telephone Encounter (Signed)
Called and left message at [facility] to call back.  Per phone note [place] [facility] ordered cpap.  ATC Patient, line busy.

## 2018-07-18 NOTE — Telephone Encounter (Signed)
Spoke with Abigail Butts at [place], she states the [place] office has been trying to get in contact with pt to set up CPAP. I reviewed the numbers we have on her chart and verfied the correct numbers. I called pt but he line rang busy X 3. If she returns the call, she must call the [place] office at [phone].

## 2018-07-18 NOTE — Telephone Encounter (Signed)
Abigail Butts with [facility] returning call, CB is [phone].  States patient has not been set up with CPAP, attempted to contact her 3 times.

## 2018-07-21 ENCOUNTER — Encounter: Payer: Self-pay | Admitting: Internal Medicine

## 2018-07-21 ENCOUNTER — Ambulatory Visit (INDEPENDENT_AMBULATORY_CARE_PROVIDER_SITE_OTHER): Payer: Medicare HMO | Admitting: Internal Medicine

## 2018-07-21 VITALS — BP 108/74 | HR 64 | Temp 97.9°F | Ht 61.0 in | Wt 245.8 lb

## 2018-07-21 DIAGNOSIS — I130 Hypertensive heart and chronic kidney disease with heart failure and stage 1 through stage 4 chronic kidney disease, or unspecified chronic kidney disease: Secondary | ICD-10-CM

## 2018-07-21 DIAGNOSIS — I5032 Chronic diastolic (congestive) heart failure: Secondary | ICD-10-CM | POA: Diagnosis not present

## 2018-07-21 DIAGNOSIS — I110 Hypertensive heart disease with heart failure: Secondary | ICD-10-CM

## 2018-07-21 DIAGNOSIS — N182 Chronic kidney disease, stage 2 (mild): Secondary | ICD-10-CM | POA: Diagnosis not present

## 2018-07-21 DIAGNOSIS — Z638 Other specified problems related to primary support group: Secondary | ICD-10-CM

## 2018-07-21 DIAGNOSIS — Z794 Long term (current) use of insulin: Secondary | ICD-10-CM | POA: Diagnosis not present

## 2018-07-21 DIAGNOSIS — Z6841 Body Mass Index (BMI) 40.0 and over, adult: Secondary | ICD-10-CM

## 2018-07-21 DIAGNOSIS — E1122 Type 2 diabetes mellitus with diabetic chronic kidney disease: Secondary | ICD-10-CM | POA: Diagnosis not present

## 2018-07-21 NOTE — Telephone Encounter (Signed)
Attempted to call Patient.  Unable to leave message, phone gets busy signal.

## 2018-07-21 NOTE — Telephone Encounter (Signed)
Received a notification from [facility] that the patient has not responded to their calls to get set up. Patient will have to contact them when she is ready.   "I have contacted this patient 3 times with no returned calls. I am voiding this order.  1/20 @ 4:39 - LVM  1/22 @ 2:41 - LVM  1/24 @ 11:49 - LVM"

## 2018-07-22 ENCOUNTER — Encounter: Payer: Self-pay | Admitting: Neurology

## 2018-07-22 ENCOUNTER — Ambulatory Visit (INDEPENDENT_AMBULATORY_CARE_PROVIDER_SITE_OTHER): Payer: Medicare HMO | Admitting: Neurology

## 2018-07-22 VITALS — BP 128/74 | HR 68 | Ht 61.0 in | Wt 246.0 lb

## 2018-07-22 DIAGNOSIS — F445 Conversion disorder with seizures or convulsions: Secondary | ICD-10-CM | POA: Diagnosis not present

## 2018-07-22 DIAGNOSIS — G40009 Localization-related (focal) (partial) idiopathic epilepsy and epileptic syndromes with seizures of localized onset, not intractable, without status epilepticus: Secondary | ICD-10-CM | POA: Diagnosis not present

## 2018-07-22 LAB — HEMOGLOBIN A1C
Est. average glucose Bld gHb Est-mCnc: 183 mg/dL
Hgb A1c MFr Bld: 8 % — ABNORMAL HIGH (ref 4.8–5.6)

## 2018-07-22 MED ORDER — LAMOTRIGINE 100 MG PO TABS: 100.0000 mg | ORAL_TABLET | Freq: Two times a day (BID) | ORAL | 3 refills | Status: DC

## 2018-07-22 NOTE — Telephone Encounter (Signed)
ATC patient on all numbers listed-unable to reach patient or leave a VM. I also called all [facility] listed members-there phones are not working.
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
## 2018-07-22 NOTE — Patient Instructions (Signed)
Great seeing you! Continue Lamictal 100mg  twice a day. Follow-up in 8 months, call for any changes.  Seizure Precautions: 1. If medication has been prescribed for you to prevent seizures, take it exactly as directed.  Do not stop taking the medicine without talking to your doctor first, even if you have not had a seizure in a long time.   2. Avoid activities in which a seizure would cause danger to yourself or to others.  Don't operate dangerous machinery, swim alone, or climb in high or dangerous places, such as on ladders, roofs, or girders.  Do not drive unless your doctor says you may.  3. If you have any warning that you may have a seizure, lay down in a safe place where you can't hurt yourself.    4.  No driving for 6 months from last seizure, as per [facility].   Please refer to the following link on the [place] website for more information: [email or web address]/[email or web address]   5.  Maintain good sleep hygiene. Avoid alcohol.  6.  Contact your doctor if you have any problems that may be related to the medicine you are taking.  7.  Call 911 and bring the patient back to the ED if:        A.  The seizure lasts longer than 5 minutes.       B.  The patient doesn't awaken shortly after the seizure  C.  The patient has new problems such as difficulty seeing, speaking or moving  D.  The patient was injured during the seizure  E.  The patient has a temperature over 102 F (39C)  F.  The patient vomited and now is having trouble breathing

## 2018-07-23 NOTE — Telephone Encounter (Signed)
We have attempted to contact pt several times with no success. Per triage protocol, message will be closed.  

## 2018-07-24 ENCOUNTER — Ambulatory Visit: Payer: Medicare HMO | Admitting: Internal Medicine

## 2018-07-29 DIAGNOSIS — Z1231 Encounter for screening mammogram for malignant neoplasm of breast: Secondary | ICD-10-CM | POA: Diagnosis not present

## 2018-07-30 ENCOUNTER — Telehealth: Payer: Self-pay | Admitting: Cardiology

## 2018-07-30 NOTE — Telephone Encounter (Signed)
Called patient, LVM to discuss her symptoms.  Left call back number.

## 2018-07-30 NOTE — Telephone Encounter (Signed)
New Message   Pt c/o of Chest Pain: STAT if CP now or developed within 24 hours  1. Are you having CP right now? Yes  2. Are you experiencing any other symptoms (ex. SOB, nausea, vomiting, sweating)? When patient was laying down in the bed she felt nauseas and started sweating  3. How long have you been experiencing CP?  Patient developed a cough last night and around 3:30am she started having the pain.  States it's not a sharp pain, but it's noticeable.   4. Is your CP continuous or coming and going? Coming and going   5. Have you taken Nitroglycerin? No she doesn't have it.  Patient hasn't taken any of her meds yet. ?

## 2018-07-31 NOTE — Telephone Encounter (Signed)
Agree with seeing PCP.  Glenetta Hew, MD

## 2018-07-31 NOTE — Telephone Encounter (Signed)
Left detailed message that pt needs to f/u PMD ./cy

## 2018-07-31 NOTE — Telephone Encounter (Signed)
Per pt coughing a lot (productive clear mucous), nasal drip. Left shoulder and arm pain radiating to to back and left breast ,no fever a little SOB had neck pain yesterday feet are a little swollen and weight is up 3 LBS Pt has taken am Furosemide and has urinated couple of times Encouraged pt to call PMD for evaluation and tx. Will forward to Dr Ellyn Hack for review and recommendations ./cy

## 2018-08-03 ENCOUNTER — Encounter: Payer: Self-pay | Admitting: Internal Medicine

## 2018-08-03 NOTE — Progress Notes (Signed)
Subjective:     Patient ID: [name] , female    DOB: [birth year] , 73 y.o.   MRN: [id number]   Chief Complaint  Patient presents with  . Diabetes  . Hypertension    HPI  Diabetes  She presents for her follow-up diabetic visit. She has type 2 diabetes mellitus. Her disease course has been improving. There are no hypoglycemic associated symptoms. Pertinent negatives for diabetes include no blurred vision and no chest pain. There are no hypoglycemic complications. Diabetic complications include nephropathy. Risk factors for coronary artery disease include diabetes mellitus, dyslipidemia, hypertension, obesity, post-menopausal and sedentary lifestyle. She never participates in exercise. An ACE inhibitor/angiotensin II receptor blocker is being taken.  Hypertension  This is a chronic problem. The current episode started more than 1 year ago. The problem has been gradually improving since onset. The problem is controlled. Pertinent negatives include no blurred vision, chest pain, palpitations or shortness of breath.   She reports compliance with meds.   Past Medical History:  Diagnosis Date  . Abnormal liver function     in the past.  . Anemia   . Arthritis    all over  . Bruises easily   . Cataract    right eye;immature  . Chronic back pain    stenosis  . Chronic cough   . Chronic kidney disease   . COPD (chronic obstructive pulmonary disease) ([place])   . Demand myocardial infarction [facility]) 2012   Demand Infarction in setting of Pancreatitis --> mild Troponin elevation, NON-OBSTRUCTIVE CAD  . Depression    takes Abilify daily as well as Zoloft  . Diastolic heart failure 4098   Grade 1 diastolic Dysfunction by Echo   . Diverticulosis   . DM (diabetes mellitus) ([place])    takes Victoza daily as well as Lantus and Humalog  . Empty sella ([place])    on MRI in 2009.  . Glaucoma   . Headache(784.0)    last migraine-4-81yr ago  . History of blood transfusion    no abnormal  reaction noted  . History of colon polyps    benign  . HTN (hypertension)    takes BKinder Morgan Energydaily  . Hyperlipidemia    takes Lipitor daily  . Joint swelling   . Nocturia   . Obesity hypoventilation syndrome ([place]   . Obstructive sleep apnea 02/2018   Notably improved split-night study with weight loss from 270 (2017) down to 250 pounds (2019).  . Pancreatitis    takes Pancrelipase daily  . Parkinson's disease ([place]    takes Sinemet daily  . Peripheral neuropathy   . Pneumonia 2012  . Seizures ([place]    takes Lamictal daily and Primidone nightly;last seizure 2wks ago  . Urinary urgency    With increased frequency  . Varicose veins of both lower extremities with pain    With edema.  Takes daily Lasix     Family History  Problem Relation Age of Onset  . Allergies Father   . Heart disease Father        before 629 . Heart failure Father   . Hypertension Father   . Hyperlipidemia Father   . Heart disease Mother   . Diabetes Mother   . Hypertension Mother   . Hyperlipidemia Mother   . Hypertension Sister   . Heart disease Sister        before 628 . Hyperlipidemia Sister   . Diabetes Brother   . Hypertension Brother   .  Diabetes Sister   . Hypertension Sister   . Diabetes Son   . Hypertension Son   . Cancer Other      Current Outpatient Medications:  .  acetaminophen (TYLENOL) 500 MG tablet, Take 500 mg by mouth every 4 (four) hours as needed (for pain). , Disp: , Rfl:  .  albuterol (PROVENTIL HFA;VENTOLIN HFA) 108 (90 Base) MCG/ACT inhaler, Inhale 1-2 puffs into the lungs every 6 (six) hours as needed for wheezing or shortness of breath (DOE WHEEZING SOB)., Disp: 1 Inhaler, Rfl: 1 .  atorvastatin (LIPITOR) 10 MG tablet, Take 1 tablet (10 mg total) by mouth daily., Disp: 30 tablet, Rfl: 0 .  B Complex-C (B-COMPLEX WITH VITAMIN C) tablet, Take 1 tablet by mouth daily., Disp: , Rfl:  .  Blood Glucose Monitoring Suppl (ACCU-CHEK NANO SMARTVIEW) W/DEVICE  KIT, Use to check blood sugar 3 times per day dx code E11.65, Disp: 1 kit, Rfl: 0 .  carvedilol (COREG) 25 MG tablet, TAKE 1 TABLET BY MOUTH TWICE DAILY (Patient taking differently: Take 25 mg by mouth 2 (two) times daily with a meal. ), Disp: 180 tablet, Rfl: 1 .  Cholecalciferol (VITAMIN D-3) 1000 units CAPS, Take 1,000 Units by mouth daily., Disp: , Rfl:  .  diclofenac sodium (VOLTAREN) 1 % GEL, Apply 2 g topically 4 (four) times daily as needed (pain)., Disp: , Rfl:  .  DROPLET PEN NEEDLES 31G X 8 MM MISC, USE DAILY WITH VICTOZA AND/OR NOVOLOG. , Disp: 400 each, Rfl: 1 .  furosemide (LASIX) 80 MG tablet, Take 0.5-1 tablets (40-80 mg total) by mouth See admin instructions. Take 80 mg M--F, take 40 mg all other days, Disp: , Rfl:  .  hydrALAZINE (APRESOLINE) 50 MG tablet, Take 2 tablet ( 100 mg) in morning and evening, and 50 mg at lunch time, may take an extra 50 mg if need B/P>150 at lunch, Disp: 540 tablet, Rfl: 3 .  isosorbide mononitrate (IMDUR) 60 MG 24 hr tablet, Take 1 tablet (60 mg total) by mouth daily., Disp: 90 tablet, Rfl: 3 .  metolazone (ZAROXOLYN) 2.5 MG tablet, Take 1 tablet (2.5 mg total) by mouth See admin instructions. Take 1 tablet Monday and Friday only if weight is greater or equal to 269, Disp: 10 tablet, Rfl: 0 .  Multiple Vitamins-Minerals (MULTIVITAMIN WITH MINERALS) tablet, Take 1 tablet by mouth daily., Disp: , Rfl:  .  Pancrelipase, Lip-Prot-Amyl, (ZENPEP) 25000-79000 units CPEP, Take 1-2 capsules by mouth See admin instructions. Take 2 capsules with meals and 1 capsule with snacks, Disp: , Rfl:  .  potassium chloride SA (K-DUR,KLOR-CON) 20 MEQ tablet, Take 2 tablets by mouth daily. On the day you take metolazone, take extra tablet for a total of 3 (59mq), Disp: 70 tablet, Rfl: 11 .  pregabalin (LYRICA) 100 MG capsule, Take 1 capsule (100 mg total) by mouth 2 (two) times daily., Disp: 60 capsule, Rfl: 0 .  Semaglutide, 1 MG/DOSE, (OZEMPIC, 1 MG/DOSE,) 2 MG/1.5ML SOPN,  Inject 1 mg into the skin once a week., Disp: 3 pen, Rfl: 1 .  sertraline (ZOLOFT) 50 MG tablet, TAKE 1 TABLET EVERY DAY, Disp: 90 tablet, Rfl: 2 .  lamoTRIgine (LAMICTAL) 100 MG tablet, Take 1 tablet (100 mg total) by mouth 2 (two) times daily., Disp: 180 tablet, Rfl: 3 .  Menthol, Topical Analgesic, 3.5 % GEL, Apply 1 application topically as needed (for neuropathy in feet)., Disp: 120 mL, Rfl: 0   Allergies  Allergen Reactions  . Other Anaphylaxis  and Rash    Bleach  . Penicillins Hives    Has patient had a PCN reaction causing immediate rash, facial/tongue/throat swelling, SOB or lightheadedness with hypotension: Yes Has patient had a PCN reaction causing severe rash involving mucus membranes or skin necrosis: No Has patient had a PCN reaction that required hospitalization: No Has patient had a PCN reaction occurring within the last 10 years: No If all of the above answers are "NO", then may proceed with Cephalosporin use.   . Sulfa Antibiotics Hives  . Aspirin Other (See Comments)     On aspirin 81 mg - Rectal bleeding in dec 2018  . Codeine     Headache and makes the patient feel "off"     Review of Systems  Constitutional: Negative.   Eyes: Negative for blurred vision.  Respiratory: Negative.  Negative for shortness of breath.   Cardiovascular: Negative for chest pain and palpitations.  Gastrointestinal: Negative.   Genitourinary: Negative.   Neurological: Negative.   Psychiatric/Behavioral: Negative.      Today's Vitals   07/21/18 1159  BP: 108/74  Pulse: 64  Temp: 97.9 F (36.6 C)  TempSrc: Oral  Weight: 245 lb 12.8 oz (111.5 kg)  PainSc: 3   PainLoc: Knee   Body mass index is 46.44 kg/m.   Objective:  Physical Exam Vitals signs and nursing note reviewed.  Constitutional:      Appearance: Normal appearance. She is obese.  HENT:     Head: Normocephalic and atraumatic.  Cardiovascular:     Rate and Rhythm: Normal rate and regular rhythm.     Heart  sounds: Murmur present.  Pulmonary:     Effort: Pulmonary effort is normal.     Breath sounds: Normal breath sounds.  Skin:    General: Skin is warm.  Neurological:     General: No focal deficit present.     Mental Status: She is alert.  Psychiatric:        Mood and Affect: Mood normal.         Assessment And Plan:     1. Type 2 diabetes mellitus with stage 2 chronic kidney disease, with long-term current use of insulin (HCC)  I DISCUSSED WITH THE PATIENT AT LENGTH REGARDING THE GOALS OF GLYCEMIC CONTROL AND POSSIBLE LONG-TERM COMPLICATIONS.  I  ALSO STRESSED THE IMPORTANCE OF COMPLIANCE WITH HOME GLUCOSE MONITORING, DIETARY RESTRICTIONS INCLUDING AVOIDANCE OF SUGARY DRINKS/PROCESSED FOODS,  ALONG WITH REGULAR EXERCISE.  I  ALSO STRESSED THE IMPORTANCE OF ANNUAL EYE EXAMS, SELF FOOT CARE AND COMPLIANCE WITH OFFICE VISITS.  - Hemoglobin A1c  2. Hypertensive heart disease with chronic diastolic congestive heart failure ([place])  Well controlled. She will continue with current meds. She is encouraged to avoid adding salt to her foods.   3. Chronic diastolic CHF (congestive heart failure), NYHA class 2 (HCC)  Chronic, she is also followed by Cardiology. Importance of dietary and medication compliance was discussed with the patient.   4. Class 3 severe obesity due to excess calories with serious comorbidity and body mass index (BMI) of 45.0 to 49.9 in adult [facility])  Importance of achieving optimal weight to decrease risk of cardiovascular disease and cancers was discussed with the patient in full detail. She is encouraged to start slowly - start with 10 minutes twice daily at least three to four days per week and to gradually build to 30 minutes five days weekly. She was given tips to incorporate more activity into her daily routine - take stairs  when possible, park farther away from  grocery stores, etc. .    5. Sibling relationship problem  I spent an additional 25 minutes with the  patient, along with her sister to counsel them on communication. I also served to mediate their dispurte. They are encouraged to seek further counseling together, and on their own. There are a lot of deep-seated issues that are present that need to be resolved. Greater than 50% of face to face time was spent in counseling and coordination of care.  Total time with patient was greater than 45 minutes.    [name], MD

## 2018-08-04 DIAGNOSIS — G4733 Obstructive sleep apnea (adult) (pediatric): Secondary | ICD-10-CM | POA: Diagnosis not present

## 2018-08-07 ENCOUNTER — Other Ambulatory Visit: Payer: Self-pay | Admitting: Adult Health

## 2018-08-07 DIAGNOSIS — M069 Rheumatoid arthritis, unspecified: Secondary | ICD-10-CM

## 2018-08-09 ENCOUNTER — Other Ambulatory Visit: Payer: Self-pay | Admitting: Adult Health

## 2018-08-09 DIAGNOSIS — I10 Essential (primary) hypertension: Secondary | ICD-10-CM | POA: Diagnosis not present

## 2018-08-09 DIAGNOSIS — G4733 Obstructive sleep apnea (adult) (pediatric): Secondary | ICD-10-CM | POA: Diagnosis not present

## 2018-08-09 DIAGNOSIS — I5032 Chronic diastolic (congestive) heart failure: Secondary | ICD-10-CM

## 2018-08-09 DIAGNOSIS — R0602 Shortness of breath: Secondary | ICD-10-CM | POA: Diagnosis not present

## 2018-08-09 DIAGNOSIS — I509 Heart failure, unspecified: Secondary | ICD-10-CM | POA: Diagnosis not present

## 2018-08-09 DIAGNOSIS — J9621 Acute and chronic respiratory failure with hypoxia: Secondary | ICD-10-CM | POA: Diagnosis not present

## 2018-08-09 DIAGNOSIS — I5030 Unspecified diastolic (congestive) heart failure: Secondary | ICD-10-CM | POA: Diagnosis not present

## 2018-08-09 DIAGNOSIS — G4089 Other seizures: Secondary | ICD-10-CM | POA: Diagnosis not present

## 2018-08-09 DIAGNOSIS — M48061 Spinal stenosis, lumbar region without neurogenic claudication: Secondary | ICD-10-CM | POA: Diagnosis not present

## 2018-08-16 ENCOUNTER — Other Ambulatory Visit: Payer: Self-pay | Admitting: Cardiology

## 2018-08-16 DIAGNOSIS — R0609 Other forms of dyspnea: Principal | ICD-10-CM

## 2018-08-18 ENCOUNTER — Other Ambulatory Visit: Payer: Self-pay | Admitting: Cardiology

## 2018-08-18 DIAGNOSIS — I5032 Chronic diastolic (congestive) heart failure: Secondary | ICD-10-CM

## 2018-08-18 DIAGNOSIS — I2510 Atherosclerotic heart disease of native coronary artery without angina pectoris: Secondary | ICD-10-CM

## 2018-08-18 NOTE — Telephone Encounter (Signed)
 *  STAT* If patient is at the pharmacy, call can be transferred to refill team.   1. Which medications need to be refilled? (please list name of each medication and dose if known)   furosemide (LASIX) 80 MG tablet Patient also states she had a 40 mg pill of the lasix isosorbide mononitrate (IMDUR) 60 MG 24 hr tablet  2. Which pharmacy/location (including street and city if local pharmacy) is medication to be sent to? [place]  3. Do they need a 30 day or 90 day supply? [place]

## 2018-08-18 NOTE — Telephone Encounter (Signed)
LMTCB... need to verify re: her Lasix.Marland Kitchen does she need 40mg  or 80mg  called in and is she taking 80mg  Mon and Friday or Monday through Friday.

## 2018-08-19 MED ORDER — FUROSEMIDE 80 MG PO TABS: ORAL_TABLET | ORAL | 3 refills | Status: DC

## 2018-08-19 MED ORDER — ISOSORBIDE MONONITRATE ER 60 MG PO TB24: 60.0000 mg | ORAL_TABLET | Freq: Every day | ORAL | 3 refills | Status: DC

## 2018-08-19 MED ORDER — FUROSEMIDE 40 MG PO TABS: ORAL_TABLET | ORAL | 3 refills | Status: DC

## 2018-08-19 NOTE — Telephone Encounter (Signed)
Pt says she is on lasix 80mg   Monday through Friday and lasix 40mg  Saturday and Sunday... new RX sent in per her request and for her Imdur.

## 2018-08-21 ENCOUNTER — Ambulatory Visit: Payer: Medicare HMO | Admitting: Adult Health

## 2018-08-21 ENCOUNTER — Other Ambulatory Visit: Payer: Self-pay | Admitting: Internal Medicine

## 2018-08-21 ENCOUNTER — Encounter: Payer: Self-pay | Admitting: Adult Health

## 2018-08-21 DIAGNOSIS — E662 Morbid (severe) obesity with alveolar hypoventilation: Secondary | ICD-10-CM

## 2018-08-21 NOTE — Progress Notes (Signed)
@Patient  ID: [name], female    DOB: 11/30/1945, 73 y.o.   MRN: [id number]  Chief Complaint  Patient presents with  . Follow-up    OSA    Referring provider: [name], MD  HPI:  Opened in error, rescheduled, as CPAP just delivered. No complaints .   TEST/EVENTS :   Allergies  Allergen Reactions  . Other Anaphylaxis and Rash    Bleach  . Penicillins Hives    Has patient had a PCN reaction causing immediate rash, facial/tongue/throat swelling, SOB or lightheadedness with hypotension: Yes Has patient had a PCN reaction causing severe rash involving mucus membranes or skin necrosis: No Has patient had a PCN reaction that required hospitalization: No Has patient had a PCN reaction occurring within the last 10 years: No If all of the above answers are "NO", then may proceed with Cephalosporin use.   . Sulfa Antibiotics Hives  . Aspirin Other (See Comments)     On aspirin 81 mg - Rectal bleeding in dec 2018  . Codeine     Headache and makes the patient feel "off"    Immunization History  Administered Date(s) Administered  . Influenza Split 04/11/2011, 06/30/2013  . Influenza, High Dose Seasonal PF 02/14/2017, 04/24/2018  . Influenza-Unspecified 04/07/2014  . Tdap 05/18/2012  . Zoster 04/14/2014    Past Medical History:  Diagnosis Date  . Abnormal liver function     in the past.  . Anemia   . Arthritis    all over  . Bruises easily   . Cataract    right eye;immature  . Chronic back pain    stenosis  . Chronic cough   . Chronic kidney disease   . COPD (chronic obstructive pulmonary disease) ([place])   . Demand myocardial infarction [facility]) 2012   Demand Infarction in setting of Pancreatitis --> mild Troponin elevation, NON-OBSTRUCTIVE CAD  . Depression    takes Abilify daily as well as Zoloft  . Diastolic heart failure 4098   Grade 1 diastolic Dysfunction by Echo   . Diverticulosis   . DM (diabetes mellitus) ([place])    takes Victoza daily as well as  Lantus and Humalog  . Empty sella ([place])    on MRI in 2009.  . Glaucoma   . Headache(784.0)    last migraine-4-54yr ago  . History of blood transfusion    no abnormal reaction noted  . History of colon polyps    benign  . HTN (hypertension)    takes BKinder Morgan Energydaily  . Hyperlipidemia    takes Lipitor daily  . Joint swelling   . Nocturia   . Obesity hypoventilation syndrome ([place]   . Obstructive sleep apnea 02/2018   Notably improved split-night study with weight loss from 270 (2017) down to 250 pounds (2019).  . Pancreatitis    takes Pancrelipase daily  . Parkinson's disease ([place]    takes Sinemet daily  . Peripheral neuropathy   . Pneumonia 2012  . Seizures ([place]    takes Lamictal daily and Primidone nightly;last seizure 2wks ago  . Urinary urgency    With increased frequency  . Varicose veins of both lower extremities with pain    With edema.  Takes daily Lasix    Tobacco History: Social History   Tobacco Use  Smoking Status Former Smoker  . Packs/day: 0.50  . Years: 25.00  . Pack years: 12.50  . Types: Cigarettes  Smokeless Tobacco Never Used  Tobacco Comment   quit smoking  20+ytrs ago   Counseling given: Not Answered Comment: quit smoking 20+ytrs ago   Outpatient Medications Prior to Visit  Medication Sig Dispense Refill  . acetaminophen (TYLENOL) 500 MG tablet Take 500 mg by mouth every 4 (four) hours as needed (for pain).     Marland Kitchen albuterol (PROVENTIL HFA;VENTOLIN HFA) 108 (90 Base) MCG/ACT inhaler Inhale 1-2 puffs into the lungs every 6 (six) hours as needed for wheezing or shortness of breath (DOE WHEEZING SOB). 1 Inhaler 1  . atorvastatin (LIPITOR) 10 MG tablet Take 1 tablet (10 mg total) by mouth daily. 30 tablet 0  . B Complex-C (B-COMPLEX WITH VITAMIN C) tablet Take 1 tablet by mouth daily.    . Blood Glucose Monitoring Suppl (ACCU-CHEK NANO SMARTVIEW) W/DEVICE KIT Use to check blood sugar 3 times per day dx code E11.65 1 kit 0  .  carvedilol (COREG) 25 MG tablet TAKE 1 TABLET BY MOUTH TWICE DAILY (Patient taking differently: Take 25 mg by mouth 2 (two) times daily with a meal. ) 180 tablet 1  . Cholecalciferol (VITAMIN D-3) 1000 units CAPS Take 1,000 Units by mouth daily.    . diclofenac sodium (VOLTAREN) 1 % GEL Apply 2 g topically 4 (four) times daily as needed (pain).    . DROPLET PEN NEEDLES 31G X 8 MM MISC USE DAILY WITH VICTOZA AND/OR NOVOLOG.  400 each 1  . furosemide (LASIX) 40 MG tablet Take one tablet (52m) by mouth on Saturday and Sunday only. 180 tablet 3  . furosemide (LASIX) 80 MG tablet Take one tablet (855m by mouth Monday through Friday only. 90 tablet 3  . hydrALAZINE (APRESOLINE) 50 MG tablet Take 2 tablet ( 100 mg) in morning and evening, and 50 mg at lunch time, may take an extra 50 mg if need B/P>150 at lunch 540 tablet 3  . isosorbide mononitrate (IMDUR) 60 MG 24 hr tablet Take 1 tablet (60 mg total) by mouth daily. 90 tablet 3  . lamoTRIgine (LAMICTAL) 100 MG tablet Take 1 tablet (100 mg total) by mouth 2 (two) times daily. 180 tablet 3  . Menthol, Topical Analgesic, 3.5 % GEL Apply 1 application topically as needed (for neuropathy in feet). 120 mL 0  . metolazone (ZAROXOLYN) 2.5 MG tablet Take 1 tablet (2.5 mg total) by mouth See admin instructions. Take 1 tablet Monday and Friday only if weight is greater or equal to 269 10 tablet 0  . Multiple Vitamins-Minerals (MULTIVITAMIN WITH MINERALS) tablet Take 1 tablet by mouth daily.    . Pancrelipase, Lip-Prot-Amyl, (ZENPEP) 25000-79000 units CPEP Take 1-2 capsules by mouth See admin instructions. Take 2 capsules with meals and 1 capsule with snacks    . potassium chloride SA (K-DUR,KLOR-CON) 20 MEQ tablet Take 2 tablets by mouth daily. On the day you take metolazone, take extra tablet for a total of 3 (6064m 70 tablet 11  . pregabalin (LYRICA) 100 MG capsule Take 1 capsule (100 mg total) by mouth 2 (two) times daily. 60 capsule 0  . Semaglutide, 1  MG/DOSE, (OZEMPIC, 1 MG/DOSE,) 2 MG/1.5ML SOPN Inject 1 mg into the skin once a week. 3 pen 1  . sertraline (ZOLOFT) 50 MG tablet TAKE 1 TABLET EVERY DAY 90 tablet 2   No facility-administered medications prior to visit.      Review of Systems:   Constitutional:   No  weight loss, night sweats,  Fevers, chills, fatigue, or  lassitude.  HEENT:   No headaches,  Difficulty swallowing,  Tooth/dental problems, or  Sore throat,                No sneezing, itching, ear ache, nasal congestion, post nasal drip,   CV:  No chest pain,  Orthopnea, PND, swelling in lower extremities, anasarca, dizziness, palpitations, syncope.   GI  No heartburn, indigestion, abdominal pain, nausea, vomiting, diarrhea, change in bowel habits, loss of appetite, bloody stools.   Resp: No shortness of breath with exertion or at rest.  No excess mucus, no productive cough,  No non-productive cough,  No coughing up of blood.  No change in color of mucus.  No wheezing.  No chest wall deformity  Skin: no rash or lesions.  GU: no dysuria, change in color of urine, no urgency or frequency.  No flank pain, no hematuria   MS:  No joint pain or swelling.  No decreased range of motion.  No back pain.    Physical Exam  There were no vitals taken for this visit.  GEN: A/Ox3; pleasant , NAD, well nourished    HEENT:  [place]/AT,  EACs-clear, TMs-wnl, NOSE-clear, THROAT-clear, no lesions, no postnasal drip or exudate noted.   NECK:  Supple w/ fair ROM; no JVD; normal carotid impulses w/o bruits; no thyromegaly or nodules palpated; no lymphadenopathy.    RESP  Clear  P & A; w/o, wheezes/ rales/ or rhonchi. no accessory muscle use, no dullness to percussion  CARD:  RRR, no m/r/g, no peripheral edema, pulses intact, no cyanosis or clubbing.  GI:   Soft & nt; nml bowel sounds; no organomegaly or masses detected.   Musco: Warm bil, no deformities or joint swelling noted.   Neuro: alert, no focal deficits noted.    Skin:  Warm, no lesions or rashes    Lab Results:  CBC    Component Value Date/Time   WBC 4.0 05/17/2018 0612   RBC 4.79 05/17/2018 0612   HGB 11.0 (L) 05/17/2018 0612   HCT 36.8 05/17/2018 0612   PLT 226 05/17/2018 0612   MCV 76.8 (L) 05/17/2018 0612   MCH 23.0 (L) 05/17/2018 0612   MCHC 29.9 (L) 05/17/2018 0612   RDW 15.9 (H) 05/17/2018 0612   LYMPHSABS 1.9 06/15/2017 0355   MONOABS 0.7 06/15/2017 0355   EOSABS 0.2 06/15/2017 0355   BASOSABS 0.0 06/15/2017 0355    BMET    Component Value Date/Time   NA 136 05/17/2018 0612   NA 137 04/24/2018 1205   K 3.5 05/17/2018 0612   CL 98 05/17/2018 0612   CO2 29 05/17/2018 0612   GLUCOSE 156 (H) 05/17/2018 0612   BUN 9 05/17/2018 0612   BUN 15 04/24/2018 1205   CREATININE 0.94 05/17/2018 0612   CREATININE 0.91 10/04/2016 1144   CALCIUM 8.6 (L) 05/17/2018 0612   GFRNONAA >60 05/17/2018 0612   GFRNONAA 65 10/04/2016 1144   GFRAA >60 05/17/2018 0612   GFRAA 75 10/04/2016 1144    BNP    Component Value Date/Time   BNP 153.9 (H) 05/15/2018 0118    ProBNP    Component Value Date/Time   PROBNP 99.1 08/29/2010 0445    Imaging: No results found.    No flowsheet data found.  No results found for: NITRICOXIDE      Assessment & Plan:   No problem-specific Assessment & Plan notes found for this encounter.     Rexene Edison, NP 08/21/2018

## 2018-08-21 NOTE — Assessment & Plan Note (Signed)
Opened in error

## 2018-08-22 ENCOUNTER — Other Ambulatory Visit: Payer: Self-pay | Admitting: *Deleted

## 2018-08-22 NOTE — Patient Outreach (Signed)
[facility]) Care Management  08/22/2018   [name] 04-17-1946 [id number]  RN Health Coach telephone call to patient.  [employer] compliance verified. Per patient her blood pressure is 151/46, blood sugar is 146 and her weight is 246 pounds. RN discussed with patient about the fasting blood sugar should be 80-13 for the A1C to be less than 7.0 .Patient had her sleep study done and has a new CPAP wearing nightly. Patient stated that she did not take her lasix yesterday. RN discussed the importance of taking her diuretic. Patient had noted that her weight was up a little today. Patient has taken all medications today. Per patient she is having  discomfort in knee. Patient stated she is going to call her PCP for a referral to an orthopedist. Patient discussed loss of family member and how it has been affecting the rest of the family. RN discussed the benefits of grief counseling. Patient has agreed to further outreach calls.   Current Medications:  Current Outpatient Medications  Medication Sig Dispense Refill  . acetaminophen (TYLENOL) 500 MG tablet Take 500 mg by mouth every 4 (four) hours as needed (for pain).     Marland Kitchen albuterol (PROVENTIL HFA;VENTOLIN HFA) 108 (90 Base) MCG/ACT inhaler Inhale 1-2 puffs into the lungs every 6 (six) hours as needed for wheezing or shortness of breath (DOE WHEEZING SOB). 1 Inhaler 1  . atorvastatin (LIPITOR) 10 MG tablet Take 1 tablet (10 mg total) by mouth daily. 30 tablet 0  . B Complex-C (B-COMPLEX WITH VITAMIN C) tablet Take 1 tablet by mouth daily.    . Blood Glucose Monitoring Suppl (ACCU-CHEK NANO SMARTVIEW) W/DEVICE KIT Use to check blood sugar 3 times per day dx code E11.65 1 kit 0  . carvedilol (COREG) 25 MG tablet TAKE 1 TABLET BY MOUTH TWICE DAILY (Patient taking differently: Take 25 mg by mouth 2 (two) times daily with a meal. ) 180 tablet 1  . Cholecalciferol (VITAMIN D-3) 1000 units CAPS Take 1,000 Units by mouth daily.    .  diclofenac sodium (VOLTAREN) 1 % GEL Apply 2 g topically 4 (four) times daily as needed (pain).    . DROPLET PEN NEEDLES 31G X 8 MM MISC USE DAILY WITH VICTOZA AND/OR NOVOLOG.  400 each 1  . furosemide (LASIX) 40 MG tablet Take one tablet ('40mg'$ ) by mouth on Saturday and Sunday only. 180 tablet 3  . furosemide (LASIX) 80 MG tablet Take one tablet ('80mg'$ ) by mouth Monday through Friday only. 90 tablet 3  . hydrALAZINE (APRESOLINE) 50 MG tablet Take 2 tablet ( 100 mg) in morning and evening, and 50 mg at lunch time, may take an extra 50 mg if need B/P>150 at lunch 540 tablet 3  . isosorbide mononitrate (IMDUR) 60 MG 24 hr tablet Take 1 tablet (60 mg total) by mouth daily. 90 tablet 3  . lamoTRIgine (LAMICTAL) 100 MG tablet Take 1 tablet (100 mg total) by mouth 2 (two) times daily. 180 tablet 3  . Menthol, Topical Analgesic, 3.5 % GEL Apply 1 application topically as needed (for neuropathy in feet). 120 mL 0  . metolazone (ZAROXOLYN) 2.5 MG tablet Take 1 tablet (2.5 mg total) by mouth See admin instructions. Take 1 tablet Monday and Friday only if weight is greater or equal to 269 10 tablet 0  . Multiple Vitamins-Minerals (MULTIVITAMIN WITH MINERALS) tablet Take 1 tablet by mouth daily.    Marland Kitchen OZEMPIC, 1 MG/DOSE, 2 MG/1.5ML SOPN INJECT 1 MG INTO THE SKIN  ONCE A WEEK 12 mL 0  . Pancrelipase, Lip-Prot-Amyl, (ZENPEP) 25000-79000 units CPEP Take 1-2 capsules by mouth See admin instructions. Take 2 capsules with meals and 1 capsule with snacks    . potassium chloride SA (K-DUR,KLOR-CON) 20 MEQ tablet Take 2 tablets by mouth daily. On the day you take metolazone, take extra tablet for a total of 3 (11mq) 70 tablet 11  . pregabalin (LYRICA) 100 MG capsule Take 1 capsule (100 mg total) by mouth 2 (two) times daily. 60 capsule 0  . sertraline (ZOLOFT) 50 MG tablet TAKE 1 TABLET EVERY DAY 90 tablet 2   No current facility-administered medications for this visit.     Functional Status:  In your present state of  health, do you have any difficulty performing the following activities: 08/22/2018 07/04/2018  Hearing? N N  Vision? N N  Difficulty concentrating or making decisions? N N  Walking or climbing stairs? Y Y  Comment Patient has pain in knee -  Dressing or bathing? N N  Doing errands, shopping? Y Y  Comment Sister takes patient for visits -  Preparing Food and eating ? YTempie Donning Comment sister helps prepare food -  Using the Toilet? N N  In the past six months, have you accidently leaked urine? Y Y  Comment some stress incont -  Do you have problems with loss of bowel control? N N  Managing your Medications? YTempie Donning Comment sister helps -  Managing your Finances? YTempie Donning Comment sister takes care of finances -  Housekeeping or managing your Housekeeping? YTempie Donning Comment lives with sister -  Some recent data might be hidden    Fall/Depression Screening: Fall Risk  07/22/2018 07/21/2018 07/04/2018  Falls in the past year? 1 1 0  Number falls in past yr: 0 0 0  Injury with Fall? 0 0 0  Risk Factor Category  - - -  Risk for fall due to : History of fall(s) - -  Follow up Falls evaluation completed - -   PHQ 2/9 Scores 08/22/2018 07/04/2018 04/24/2018 11/25/2017 02/28/2017 02/15/2017 10/08/2016  PHQ - 2 Score 0 0 2 0 0 0 0  PHQ- 9 Score - - 4 - - - -   THN CM Care Plan Problem One     Most Recent Value  Care Plan Problem One  Knowledge deficit in self management of CHF  Role Documenting the Problem One  HPimafor Problem One  Active  THN Long Term Goal   Patient will not have any readmissions within the next 90 days  THN Long Term Goal Start Date  08/22/18  Interventions for Problem One Long Term Goal  RN discussed medication adherence. Patient is aware of zones but need reiterating. RN will follow up with patient for further discussion  THN CM Short Term Goal #1   Patient will adhere more to diet on reduce sodium intake within the next 30 days  Interventions for Short Term Goal #1  RN  discussed with patient about food that you eat out like chickens and fish have a lot of sodium when they are not baked or broiled. RN  will follow up with further discussion  THN CM Short Term Goal #2   Patient will follow up with pulmonologist for sleep apnea results and getting a CPAP within the next 30 days  THN CM Short Term Goal #3  Patient will verbalize following up making health maintenance appointments  within the next 30 days  THN CM Short Term Goal #3 Start Date  08/22/18  Interventions for Short Tern Goal #3  RN discussed with patient about setting up health maintenance appointments. RN  discussed the importance of eye exam and Dental exam. Patient will follow up with specialis vists and PCP. RN will follow up for compliance     Assessment:  Patient is checking her blood pressure, weight and blood sugar each day A1C 8.0 BP 151/46 Wt 246 pounds Blood sugar 148 Patient has New CPAP  Patient forgot to take lasix yesterday Patient has an exercise routine  Plan: RN reiterated medication adherence Patient wears CPAP nightly RN discussed cleaning CPAP RN discussed Blood sugar range and A1C  RN discussed health care maintenance and making appointments RN discussed eating healthy RN discussed exercise RN will follow up within the month of May RN discussed grief counseling and the benefits   [place] [facility] [phone]

## 2018-09-02 ENCOUNTER — Ambulatory Visit (INDEPENDENT_AMBULATORY_CARE_PROVIDER_SITE_OTHER): Payer: Medicare HMO | Admitting: Internal Medicine

## 2018-09-02 ENCOUNTER — Encounter: Payer: Self-pay | Admitting: Internal Medicine

## 2018-09-02 VITALS — BP 116/70 | HR 65 | Temp 97.9°F | Ht 61.0 in | Wt 245.0 lb

## 2018-09-02 DIAGNOSIS — M1711 Unilateral primary osteoarthritis, right knee: Secondary | ICD-10-CM | POA: Diagnosis not present

## 2018-09-02 DIAGNOSIS — M25531 Pain in right wrist: Secondary | ICD-10-CM | POA: Diagnosis not present

## 2018-09-02 DIAGNOSIS — Z6841 Body Mass Index (BMI) 40.0 and over, adult: Secondary | ICD-10-CM

## 2018-09-02 DIAGNOSIS — M778 Other enthesopathies, not elsewhere classified: Secondary | ICD-10-CM | POA: Diagnosis not present

## 2018-09-02 DIAGNOSIS — G4733 Obstructive sleep apnea (adult) (pediatric): Secondary | ICD-10-CM | POA: Diagnosis not present

## 2018-09-02 MED ORDER — HYDROCODONE-ACETAMINOPHEN 5-325 MG PO TABS: 1.0000 | ORAL_TABLET | Freq: Four times a day (QID) | ORAL | 0 refills | Status: DC | PRN

## 2018-09-03 LAB — ANTINUCLEAR ANTIBODIES, IFA: ANA Titer 1: NEGATIVE

## 2018-09-03 LAB — RHEUMATOID FACTOR: Rhuematoid fact SerPl-aCnc: <10 IU/mL (ref 0.0–13.9)

## 2018-09-03 LAB — URIC ACID: Uric Acid: 7 mg/dL (ref 2.5–7.1)

## 2018-09-03 LAB — SEDIMENTATION RATE: Sed Rate: 75 mm/hr — ABNORMAL HIGH (ref 0–40)

## 2018-09-03 LAB — CYCLIC CITRUL PEPTIDE ANTIBODY, IGG/IGA: Cyclic Citrullin Peptide Ab: 9 units (ref 0–19)

## 2018-09-07 DIAGNOSIS — I5032 Chronic diastolic (congestive) heart failure: Secondary | ICD-10-CM | POA: Diagnosis not present

## 2018-09-07 DIAGNOSIS — J9621 Acute and chronic respiratory failure with hypoxia: Secondary | ICD-10-CM | POA: Diagnosis not present

## 2018-09-07 DIAGNOSIS — R062 Wheezing: Secondary | ICD-10-CM | POA: Diagnosis not present

## 2018-09-07 DIAGNOSIS — G4733 Obstructive sleep apnea (adult) (pediatric): Secondary | ICD-10-CM | POA: Diagnosis not present

## 2018-09-08 ENCOUNTER — Telehealth: Payer: Self-pay

## 2018-09-08 NOTE — Telephone Encounter (Signed)
Left the patient a message to call back for lab results. 

## 2018-09-08 NOTE — Telephone Encounter (Signed)
-----   Message from [name], MD sent at 09/05/2018 10:55 AM EDT ----- Your arthritis panel is negative. However, your sed rate is elevated. This is a marker for inflammation. Please cut out processed foods.  It is important to stay well hydrated.  Avoid shellfish. Let me know if you have another flare.

## 2018-09-10 ENCOUNTER — Other Ambulatory Visit: Payer: Self-pay | Admitting: Adult Health

## 2018-09-10 ENCOUNTER — Encounter: Payer: Self-pay | Admitting: Internal Medicine

## 2018-09-17 ENCOUNTER — Ambulatory Visit: Payer: Self-pay | Admitting: Adult Health

## 2018-09-19 ENCOUNTER — Other Ambulatory Visit: Payer: Self-pay | Admitting: Cardiology

## 2018-09-19 MED ORDER — HYDRALAZINE HCL 50 MG PO TABS: ORAL_TABLET | ORAL | 5 refills | Status: DC

## 2018-09-19 NOTE — Telephone Encounter (Signed)
 *  STAT* If patient is at the pharmacy, call can be transferred to refill team.   1. Which medications need to be refilled? (please list name of each medication and dose if known) hydrALAZINE (APRESOLINE) 50 MG tablet  2. Which pharmacy/location (including street and city if local pharmacy) is medication to be sent to? [place]  3. Do they need a 30 day or 90 day supply? 90  Please resend script to [facility] instead of [facility]. Patient has enough medicine left to take today and then she will be out.

## 2018-09-20 ENCOUNTER — Other Ambulatory Visit: Payer: Self-pay | Admitting: Internal Medicine

## 2018-09-20 ENCOUNTER — Other Ambulatory Visit: Payer: Self-pay | Admitting: Cardiology

## 2018-09-20 DIAGNOSIS — G40909 Epilepsy, unspecified, not intractable, without status epilepticus: Secondary | ICD-10-CM

## 2018-09-22 NOTE — Telephone Encounter (Signed)
RX Refill 

## 2018-09-23 NOTE — Telephone Encounter (Signed)
Pregabalin  100 mg refill

## 2018-09-24 ENCOUNTER — Telehealth: Payer: Self-pay | Admitting: Cardiology

## 2018-09-24 NOTE — Telephone Encounter (Signed)
  Pt c/o medication issue:  1. Name of Medication: hydrALAZINE (APRESOLINE) 50 MG tablet  2. How are you currently taking this medication (dosage and times per day)? See script  3. Are you having a reaction (difficulty breathing--STAT)? no  4. What is your medication issue? Pharmacist says the script is written above the manufacturer recommended daily amount. Please advise.

## 2018-09-24 NOTE — Telephone Encounter (Signed)
Pt had med filled at [facility] this has been addressed .Adonis Housekeeper

## 2018-09-24 NOTE — Telephone Encounter (Signed)
Prescription should read 100 mg bid, 50 mg mid day.  May take extra 50 mg for systolic BP > 229.  That keeps her at the 300 mg/day max.  Thanks

## 2018-09-24 NOTE — Telephone Encounter (Signed)
Appears pt is to take 100 mg bid and may take an extra 50 mg in afternoon if B/P greater than 150 Will forward to [employer] for review ./cy

## 2018-09-25 ENCOUNTER — Encounter: Payer: Self-pay | Admitting: Adult Health

## 2018-10-01 ENCOUNTER — Other Ambulatory Visit: Payer: Self-pay | Admitting: Internal Medicine

## 2018-10-01 DIAGNOSIS — I2510 Atherosclerotic heart disease of native coronary artery without angina pectoris: Secondary | ICD-10-CM

## 2018-10-03 DIAGNOSIS — G4733 Obstructive sleep apnea (adult) (pediatric): Secondary | ICD-10-CM | POA: Diagnosis not present

## 2018-10-04 NOTE — Progress Notes (Signed)
Subjective:     Patient ID: [name] , female    DOB: March 20, 1946 , 73 y.o.   MRN: [id number]   Chief Complaint  Patient presents with  . Generalized Body Aches    right hand, right knee    HPI  Knee Pain   The incident occurred more than 1 week ago. The incident occurred at home. There was no injury mechanism. The pain is present in the right knee. The quality of the pain is described as aching. The pain is at a severity of 7/10. The pain is moderate. The pain has been fluctuating since onset. She reports no foreign bodies present. The symptoms are aggravated by weight bearing. She has tried elevation and acetaminophen for the symptoms. The treatment provided mild relief.     Past Medical History:  Diagnosis Date  . Abnormal liver function     in the past.  . Anemia   . Arthritis    all over  . Bruises easily   . Cataract    right eye;immature  . Chronic back pain    stenosis  . Chronic cough   . Chronic kidney disease   . COPD (chronic obstructive pulmonary disease) ([place])   . Demand myocardial infarction [facility]) 2012   Demand Infarction in setting of Pancreatitis --> mild Troponin elevation, NON-OBSTRUCTIVE CAD  . Depression    takes Abilify daily as well as Zoloft  . Diastolic heart failure 6295   Grade 1 diastolic Dysfunction by Echo   . Diverticulosis   . DM (diabetes mellitus) ([place])    takes Victoza daily as well as Lantus and Humalog  . Empty sella ([place])    on MRI in 2009.  . Glaucoma   . Headache(784.0)    last migraine-4-34yr ago  . History of blood transfusion    no abnormal reaction noted  . History of colon polyps    benign  . HTN (hypertension)    takes BKinder Morgan Energydaily  . Hyperlipidemia    takes Lipitor daily  . Joint swelling   . Nocturia   . Obesity hypoventilation syndrome ([place]   . Obstructive sleep apnea 02/2018   Notably improved split-night study with weight loss from 270 (2017) down to 250 pounds (2019).  .  Pancreatitis    takes Pancrelipase daily  . Parkinson's disease ([place]    takes Sinemet daily  . Peripheral neuropathy   . Pneumonia 2012  . Seizures ([place]    takes Lamictal daily and Primidone nightly;last seizure 2wks ago  . Urinary urgency    With increased frequency  . Varicose veins of both lower extremities with pain    With edema.  Takes daily Lasix     Family History  Problem Relation Age of Onset  . Allergies Father   . Heart disease Father        before 659 . Heart failure Father   . Hypertension Father   . Hyperlipidemia Father   . Heart disease Mother   . Diabetes Mother   . Hypertension Mother   . Hyperlipidemia Mother   . Hypertension Sister   . Heart disease Sister        before 685 . Hyperlipidemia Sister   . Diabetes Brother   . Hypertension Brother   . Diabetes Sister   . Hypertension Sister   . Diabetes Son   . Hypertension Son   . Cancer Other      Current Outpatient Medications:  .  acetaminophen (TYLENOL) 500 MG tablet, Take 500 mg by mouth every 4 (four) hours as needed (for pain). , Disp: , Rfl:  .  albuterol (PROVENTIL HFA;VENTOLIN HFA) 108 (90 Base) MCG/ACT inhaler, Inhale 1-2 puffs into the lungs every 6 (six) hours as needed for wheezing or shortness of breath (DOE WHEEZING SOB)., Disp: 1 Inhaler, Rfl: 1 .  B Complex-C (B-COMPLEX WITH VITAMIN C) tablet, Take 1 tablet by mouth daily., Disp: , Rfl:  .  Blood Glucose Monitoring Suppl (ACCU-CHEK NANO SMARTVIEW) W/DEVICE KIT, Use to check blood sugar 3 times per day dx code E11.65, Disp: 1 kit, Rfl: 0 .  Cholecalciferol (VITAMIN D-3) 1000 units CAPS, Take 1,000 Units by mouth daily., Disp: , Rfl:  .  diclofenac sodium (VOLTAREN) 1 % GEL, Apply 2 g topically 4 (four) times daily as needed (pain)., Disp: , Rfl:  .  DROPLET PEN NEEDLES 31G X 8 MM MISC, USE DAILY WITH VICTOZA AND/OR NOVOLOG. , Disp: 400 each, Rfl: 1 .  furosemide (LASIX) 40 MG tablet, Take one tablet ('40mg'$ ) by mouth on Saturday and  Sunday only., Disp: 180 tablet, Rfl: 3 .  furosemide (LASIX) 80 MG tablet, Take one tablet ('80mg'$ ) by mouth Monday through Friday only., Disp: 90 tablet, Rfl: 3 .  isosorbide mononitrate (IMDUR) 60 MG 24 hr tablet, Take 1 tablet (60 mg total) by mouth daily., Disp: 90 tablet, Rfl: 3 .  lamoTRIgine (LAMICTAL) 100 MG tablet, Take 1 tablet (100 mg total) by mouth 2 (two) times daily., Disp: 180 tablet, Rfl: 3 .  Menthol, Topical Analgesic, 3.5 % GEL, Apply 1 application topically as needed (for neuropathy in feet)., Disp: 120 mL, Rfl: 0 .  Multiple Vitamins-Minerals (MULTIVITAMIN WITH MINERALS) tablet, Take 1 tablet by mouth daily., Disp: , Rfl:  .  OZEMPIC, 1 MG/DOSE, 2 MG/1.5ML SOPN, INJECT 1 MG INTO THE SKIN ONCE A WEEK, Disp: 12 mL, Rfl: 0 .  Pancrelipase, Lip-Prot-Amyl, (ZENPEP) 25000-79000 units CPEP, Take 1-2 capsules by mouth See admin instructions. Take 2 capsules with meals and 1 capsule with snacks, Disp: , Rfl:  .  potassium chloride SA (K-DUR,KLOR-CON) 20 MEQ tablet, Take 2 tablets by mouth daily. On the day you take metolazone, take extra tablet for a total of 3 (21mq), Disp: 70 tablet, Rfl: 11 .  sertraline (ZOLOFT) 50 MG tablet, TAKE 1 TABLET EVERY DAY, Disp: 90 tablet, Rfl: 2 .  atorvastatin (LIPITOR) 10 MG tablet, TAKE 1 TABLET EVERY DAY, Disp: 90 tablet, Rfl: 1 .  carvedilol (COREG) 25 MG tablet, Take 1 tablet by mouth twice daily, Disp: 180 tablet, Rfl: 0 .  hydrALAZINE (APRESOLINE) 50 MG tablet, Take 2 tablet ( 100 mg) in morning and evening, and 50 mg at lunch time, may take an extra 100 mg if need B/P>150 at lunch, Disp: 210 tablet, Rfl: 5 .  HYDROcodone-acetaminophen (NORCO/VICODIN) 5-325 MG tablet, Take 1 tablet by mouth every 6 (six) hours as needed for moderate pain., Disp: 30 tablet, Rfl: 0 .  metolazone (ZAROXOLYN) 2.5 MG tablet, Take 1 tablet (2.5 mg total) by mouth See admin instructions. Take 1 tablet Monday and Friday only if weight is greater or equal to 269 (Patient not  taking: Reported on 09/02/2018), Disp: 10 tablet, Rfl: 0 .  pregabalin (LYRICA) 100 MG capsule, Take 1 capsule by mouth twice daily, Disp: 180 capsule, Rfl: 1   Allergies  Allergen Reactions  . Other Anaphylaxis and Rash    Bleach  . Penicillins Hives    Has patient had  a PCN reaction causing immediate rash, facial/tongue/throat swelling, SOB or lightheadedness with hypotension: Yes Has patient had a PCN reaction causing severe rash involving mucus membranes or skin necrosis: No Has patient had a PCN reaction that required hospitalization: No Has patient had a PCN reaction occurring within the last 10 years: No If all of the above answers are "NO", then may proceed with Cephalosporin use.   . Sulfa Antibiotics Hives  . Aspirin Other (See Comments)     On aspirin 81 mg - Rectal bleeding in dec 2018  . Codeine     Headache and makes the patient feel "off"     Review of Systems  Constitutional: Negative.   Respiratory: Negative.   Cardiovascular: Negative.   Gastrointestinal: Negative.   Musculoskeletal: Positive for arthralgias (also with R wrist pain. Denies recent fall. Pain with movement. sometimes awakens w wrist pain. ).  Neurological: Negative.   Psychiatric/Behavioral: Negative.      Today's Vitals   09/02/18 1420  BP: 116/70  Pulse: 65  Temp: 97.9 F (36.6 C)  TempSrc: Oral  Weight: 245 lb (111.1 kg)  Height: '5\' 1"'$  (1.549 m)  PainSc: 9    Body mass index is 46.29 kg/m.   Objective:  Physical Exam Vitals signs and nursing note reviewed.  Constitutional:      Appearance: Normal appearance. She is obese.  HENT:     Head: Normocephalic and atraumatic.  Cardiovascular:     Rate and Rhythm: Normal rate and regular rhythm.     Heart sounds: Normal heart sounds.  Pulmonary:     Effort: Pulmonary effort is normal.     Breath sounds: Normal breath sounds.  Musculoskeletal:     Right wrist: She exhibits tenderness.     Right knee: She exhibits swelling and bony  tenderness.  Skin:    General: Skin is warm.  Neurological:     General: No focal deficit present.     Mental Status: She is alert.  Psychiatric:        Mood and Affect: Mood normal.        Behavior: Behavior normal.         Assessment And Plan:     1. Primary osteoarthritis of right knee  Chronic. She is encouraged to follow an anti-inflammatory diet. She is encouraged to follow diet free of processed foods and sugary drinks. She is advised to apply topical compounded pain cream to front/back of her knee. She is reminded weight loss will likely decrease the severity of her symptoms.   2. Tendinitis of right wrist  She was given rx wrist splint. She is encouraged to wear nightly. She will let me know if her symptoms persist.   3. Right wrist pain  I will check an autoimmune arthritis panel. I will make further recommendations once her labs are available for review.   - ANA, IFA (with reflex) - CYCLIC CITRUL PEPTIDE ANTIBODY, IGG/IGA - Rheumatoid factor - Sedimentation rate - Uric acid  4. Morbid obesity ([place])  Importance of achieving optimal weight to decrease risk of cardiovascular disease and cancers was discussed with the patient in full detail. She is encouraged to start slowly - start with 10 minutes twice daily at least three to four days per week and to gradually build to 30 minutes five days weekly. She was given tips to incorporate more activity into her daily routine - take stairs when possible, park farther away from grocery stores, etc.   [name], MD  THE PATIENT IS ENCOURAGED TO PRACTICE SOCIAL DISTANCING DUE TO THE COVID-19 PANDEMIC.   

## 2018-10-08 DIAGNOSIS — J9621 Acute and chronic respiratory failure with hypoxia: Secondary | ICD-10-CM | POA: Diagnosis not present

## 2018-10-08 DIAGNOSIS — R062 Wheezing: Secondary | ICD-10-CM | POA: Diagnosis not present

## 2018-10-08 DIAGNOSIS — G4733 Obstructive sleep apnea (adult) (pediatric): Secondary | ICD-10-CM | POA: Diagnosis not present

## 2018-10-08 DIAGNOSIS — I5032 Chronic diastolic (congestive) heart failure: Secondary | ICD-10-CM | POA: Diagnosis not present

## 2018-10-20 ENCOUNTER — Other Ambulatory Visit: Payer: Self-pay

## 2018-10-20 ENCOUNTER — Ambulatory Visit (INDEPENDENT_AMBULATORY_CARE_PROVIDER_SITE_OTHER): Payer: Medicare HMO | Admitting: Adult Health

## 2018-10-20 ENCOUNTER — Telehealth: Payer: Self-pay | Admitting: Adult Health

## 2018-10-20 ENCOUNTER — Ambulatory Visit: Payer: Medicare HMO | Admitting: Adult Health

## 2018-10-20 DIAGNOSIS — G4733 Obstructive sleep apnea (adult) (pediatric): Secondary | ICD-10-CM

## 2018-10-20 NOTE — Telephone Encounter (Signed)
Pt scheduled for telephone visit with [place] NP for 10/20/18 @ 1000 ATC patient at 1020 > home number was busy x2, ATC cell number but line rang with no answer and VM is not set up ATC patient at 1120 > home number was busy x1, ATC cell number but line rang with no answer and VM is not set up  Will attempt to call patient this afternoon

## 2018-10-20 NOTE — Telephone Encounter (Signed)
ATC patient again > home number was busy x1, ATC cell number but line rang with no answer and VM is not set up Will sign off

## 2018-10-20 NOTE — Progress Notes (Signed)
Opened in error, televisit , tried to call x 3 with no answer, busy signal and no voice mail and 2 main contact numbers.  Will close encounter .

## 2018-10-22 ENCOUNTER — Encounter: Payer: Self-pay | Admitting: Internal Medicine

## 2018-10-22 ENCOUNTER — Ambulatory Visit (INDEPENDENT_AMBULATORY_CARE_PROVIDER_SITE_OTHER): Payer: Medicare HMO

## 2018-10-22 ENCOUNTER — Other Ambulatory Visit: Payer: Self-pay | Admitting: Internal Medicine

## 2018-10-22 ENCOUNTER — Ambulatory Visit (INDEPENDENT_AMBULATORY_CARE_PROVIDER_SITE_OTHER): Payer: Medicare HMO | Admitting: Internal Medicine

## 2018-10-22 ENCOUNTER — Other Ambulatory Visit: Payer: Self-pay

## 2018-10-22 VITALS — BP 134/70 | HR 70 | Temp 98.2°F | Ht 61.0 in | Wt 239.6 lb

## 2018-10-22 VITALS — BP 134/70 | HR 70 | Temp 98.2°F | Ht 61.0 in | Wt 239.8 lb

## 2018-10-22 DIAGNOSIS — E1122 Type 2 diabetes mellitus with diabetic chronic kidney disease: Secondary | ICD-10-CM | POA: Diagnosis not present

## 2018-10-22 DIAGNOSIS — Z0000 Encounter for general adult medical examination without abnormal findings: Secondary | ICD-10-CM

## 2018-10-22 DIAGNOSIS — I5032 Chronic diastolic (congestive) heart failure: Secondary | ICD-10-CM

## 2018-10-22 DIAGNOSIS — I130 Hypertensive heart and chronic kidney disease with heart failure and stage 1 through stage 4 chronic kidney disease, or unspecified chronic kidney disease: Secondary | ICD-10-CM

## 2018-10-22 DIAGNOSIS — N182 Chronic kidney disease, stage 2 (mild): Secondary | ICD-10-CM

## 2018-10-22 DIAGNOSIS — M25531 Pain in right wrist: Secondary | ICD-10-CM

## 2018-10-22 DIAGNOSIS — Z794 Long term (current) use of insulin: Secondary | ICD-10-CM

## 2018-10-22 DIAGNOSIS — Z6841 Body Mass Index (BMI) 40.0 and over, adult: Secondary | ICD-10-CM | POA: Diagnosis not present

## 2018-10-22 DIAGNOSIS — I110 Hypertensive heart disease with heart failure: Secondary | ICD-10-CM

## 2018-10-22 LAB — POCT URINALYSIS DIPSTICK
Bilirubin, UA: NEGATIVE
Blood, UA: NEGATIVE
Glucose, UA: NEGATIVE
Ketones, UA: NEGATIVE
Leukocytes, UA: NEGATIVE
Nitrite, UA: NEGATIVE
Protein, UA: NEGATIVE
Spec Grav, UA: 1.015 (ref 1.010–1.025)
Urobilinogen, UA: 0.2 E.U./dL
pH, UA: 6 (ref 5.0–8.0)

## 2018-10-22 LAB — POCT UA - MICROALBUMIN
Albumin/Creatinine Ratio, Urine, POC: <30
Creatinine, POC: 50 mg/dL
Microalbumin Ur, POC: 10 mg/L

## 2018-10-22 NOTE — Progress Notes (Signed)
Subjective:   [name] is a 73 y.o. female who presents for Medicare Annual (Subsequent) preventive examination.  Review of Systems:  n/a Cardiac Risk Factors include: advanced age (>56mn, >>53women);obesity (BMI >30kg/m2);hypertension;diabetes mellitus     Objective:     Vitals: BP 134/70 (BP Location: Left Arm, Patient Position: Sitting)   Pulse 70   Temp 98.2 F (36.8 C) (Oral)   Ht 5' 1" (1.549 m)   Wt 239 lb 9.6 oz (108.7 kg)   BMI 45.27 kg/m   Body mass index is 45.27 kg/m.  Advanced Directives 10/22/2018 05/15/2018 11/25/2017 10/21/2017 06/23/2017 06/16/2017 02/28/2017  Does Patient Have a Medical Advance Directive? Yes Yes Yes Yes - Yes No  Type of Advance Directive HIselinLiving will Living will;Healthcare Power of AKanewill - Living will;Healthcare Power of Attorney -  Does patient want to make changes to medical advance directive? - No - Patient declined No - Patient declined No - Patient declined No - Patient declined No - Patient declined -  Copy of HGreenin Chart? No - copy requested No - copy requested - - - - -  Would patient like information on creating a medical advance directive? - - - - - - No - Patient declined  Pre-existing out of facility DNR order (yellow form or pink MOST form) - - - - - - -    Tobacco Social History   Tobacco Use  Smoking Status Former Smoker  . Packs/day: 0.50  . Years: 25.00  . Pack years: 12.50  . Types: Cigarettes  Smokeless Tobacco Never Used  Tobacco Comment   quit smoking 20+ytrs ago     Counseling given: Not Answered Comment: quit smoking 20+ytrs ago   Clinical Intake:  Pre-visit preparation completed: Yes  Pain : 0-10 Pain Score: 8  Pain Type: Chronic pain Pain Location: Arm Pain Orientation: Lower Pain Descriptors / Indicators: Sharp Pain Onset: More than a month ago Pain Frequency: Intermittent Pain Relieving  Factors: nothing helps Effect of Pain on Daily Activities: trouble writing, bathing, brushing teeth tying shoe, lifting things  Pain Relieving Factors: nothing helps  Nutritional Status: BMI > 30  Obese Nutritional Risks: None Diabetes: Yes CBG done?: No Did pt. bring in CBG monitor from home?: No  How often do you need to have someone help you when you read instructions, pamphlets, or other written materials from your doctor or pharmacy?: 1 - Never What is the last grade level you completed in school?: some college  Interpreter Needed?: No  Information entered by :: NAllen LPN  Past Medical History:  Diagnosis Date  . Abnormal liver function     in the past.  . Anemia   . Arthritis    all over  . Bruises easily   . Cataract    right eye;immature  . Chronic back pain    stenosis  . Chronic cough   . Chronic kidney disease   . COPD (chronic obstructive pulmonary disease) ([facility]   . Demand myocardial infarction ([facility] 2012   Demand Infarction in setting of Pancreatitis --> mild Troponin elevation, NON-OBSTRUCTIVE CAD  . Depression    takes Abilify daily as well as Zoloft  . Diastolic heart failure 21749  Grade 1 diastolic Dysfunction by Echo   . Diverticulosis   . DM (diabetes mellitus) ([facility]    takes Victoza daily as well as Lantus and Humalog  . Empty sella ([facility]  on MRI in 2009.  . Glaucoma   . Headache(784.0)    last migraine-4-66yr ago  . History of blood transfusion    no abnormal reaction noted  . History of colon polyps    benign  . HTN (hypertension)    takes BKinder Morgan Energydaily  . Hyperlipidemia    takes Lipitor daily  . Joint swelling   . Nocturia   . Obesity hypoventilation syndrome ([facility]   . Obstructive sleep apnea 02/2018   Notably improved split-night study with weight loss from 270 (2017) down to 250 pounds (2019).  . Pancreatitis    takes Pancrelipase daily  . Parkinson's disease ([facility] [place]    takes Sinemet daily  . Peripheral  neuropathy   . Pneumonia 2012  . Seizures ([facility]    takes Lamictal daily and Primidone nightly;last seizure 2wks ago  . Urinary urgency    With increased frequency  . Varicose veins of both lower extremities with pain    With edema.  Takes daily Lasix   Past Surgical History:  Procedure Laterality Date  . ABDOMINAL HYSTERECTOMY    . APPENDECTOMY    . BACK SURGERY    . CARDIAC CATHETERIZATION  2009/March 2012   2009: (Dr. KAlvie Heidelberg Nonobstructive CAD; 2012: Minimal CAD --> false positive stress test  . Cardiac Event Monitor  September-October 2017   Sinus rhythm with occasional PACs and artifact. No arrhythmias besides one short run of tachycardia.  . CHOLECYSTECTOMY    . COLONOSCOPY N/A 08/15/2012   Procedure: COLONOSCOPY;  Surgeon: WArta Silence MD;  Location: [facility];  Service: Endoscopy;  Laterality: N/A;  . COLONOSCOPY WITH PROPOFOL Left 06/17/2017   Procedure: COLONOSCOPY WITH PROPOFOL;  Surgeon: KRonnette Juniper MD;  [place] [facility]  Service: Gastroenterology;  Laterality: Left;  . CORONARY CALCIUM SCORE AND CTA  04/2018   Coronary calcium score 71.9.  Very large, hyperdynamic LAD wrapping apex giving rise to PDA.  Moderate LAD-diagonal and circumflex plaque -> CT FFR suggested positive findings in distal D1, D2 and distal circumflex.  Referred for cath. ==> FALSE POSITIVE  . ESOPHAGOGASTRODUODENOSCOPY    . eye cysts Bilateral   . LASIK    . LEFT HEART CATH AND CORONARY ANGIOGRAPHY N/A 05/16/2018   Procedure: LEFT HEART CATH AND CORONARY ANGIOGRAPHY;  Surgeon: SBelva Crome MD;  Location: [facility]:  Angiographically normal coronary arteries with LVEDP of 21 mmHg.  Large draping hyperdominant LAD that wraps the apex and provides distal half of the PDA.  Relatively small caliber distal Cx -->proxLPDA, non-dom RCA. No Cx or Diag lesions -- FALSE + CT FFR  . NM MYOVIEW LTD  01/2016; 01/2018   a)LOW RISK. No ischemia or infarction;; b) EF 55-60%. NO ST changes.  No  ischemia or Infarction. NO significant RV enlargement.  LOW RISK.  .Marland KitchenRECTAL POLYPECTOMY    . TONSILLECTOMY    . TRANSTHORACIC ECHOCARDIOGRAM  01/2016; 05/2017   A) Normal LV chamber size with mod LVH pattern. EF 50-55%. Severe LA dilation. Mod RA dilation. PAP elevated at 38 mmHg;; B) Mild concentric LVH.  EF 55-60% & no RWMA.  Grade 2 DD.  Diastolic flattening of the ventricular septum == ? elevated PAP.  Mild aortic valve calcification/sclerosis.  Mod LA dilation.  Mild RV dilation & Mod RA dilation  . TRANSTHORACIC ECHOCARDIOGRAM  10/2017; 01/2018   a) EF 55-60%.  No RWMA,  Moderate LA dilation.  Mild LA dilation.  Peak PA pressures ~57 mmHg (moderate);;; b) EF 55-60%.  GRII DD.  Suggestion of RV volume overload with moderate RV dilation.  Severe LA and RA dilation.Marland Kitchen  PA pressure ~67%.  . TUBAL LIGATION    . vaginal cyst removed     several times   Family History  Problem Relation Age of Onset  . Allergies Father   . Heart disease Father        before 83  . Heart failure Father   . Hypertension Father   . Hyperlipidemia Father   . Heart disease Mother   . Diabetes Mother   . Hypertension Mother   . Hyperlipidemia Mother   . Hypertension Sister   . Heart disease Sister        before 53  . Hyperlipidemia Sister   . Diabetes Brother   . Hypertension Brother   . Diabetes Sister   . Hypertension Sister   . Diabetes Son   . Hypertension Son   . Cancer Other    Social History   Socioeconomic History  . Marital status: Divorced    Spouse name: Not on file  . Number of children: Not on file  . Years of education: Not on file  . Highest education level: Not on file  Occupational History  . Occupation: Disabled    Comment: CNA  Social Needs  . Financial resource strain: Not hard at all  . Food insecurity:    Worry: Never true    Inability: Never true  . Transportation needs:    Medical: No    Non-medical: No  Tobacco Use  . Smoking status: Former Smoker    Packs/day:  0.50    Years: 25.00    Pack years: 12.50    Types: Cigarettes  . Smokeless tobacco: Never Used  . Tobacco comment: quit smoking 20+ytrs ago  Substance and Sexual Activity  . Alcohol use: No  . Drug use: No  . Sexual activity: Not Currently    Birth control/protection: Surgical  Lifestyle  . Physical activity:    Days per week: 2 days    Minutes per session: 30 min  . Stress: Not at all  Relationships  . Social connections:    Talks on phone: Not on file    Gets together: Not on file    Attends religious service: Not on file    Active member of club or organization: Not on file    Attends meetings of clubs or organizations: Not on file    Relationship status: Not on file  Other Topics Concern  . Not on file  Social History Narrative   She lives in [place]. She has lots of family in the area and is accompanied by her sister.   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   She has a distant history of smoking, quit 20 years ago.    Outpatient Encounter Medications as of 10/22/2018  Medication Sig  . acetaminophen (TYLENOL) 500 MG tablet Take 500 mg by mouth every 4 (four) hours as needed (for pain).   Marland Kitchen albuterol (PROVENTIL HFA;VENTOLIN HFA) 108 (90 Base) MCG/ACT inhaler Inhale 1-2 puffs into the lungs every 6 (six) hours as needed for wheezing or shortness of breath (DOE WHEEZING SOB).  Marland Kitchen atorvastatin (LIPITOR) 10 MG tablet TAKE 1 TABLET EVERY DAY  . B Complex-C (B-COMPLEX WITH VITAMIN C) tablet Take 1 tablet by mouth daily.  . Blood Glucose Monitoring Suppl (ACCU-CHEK NANO SMARTVIEW) W/DEVICE KIT Use to check blood sugar 3 times per day dx  code E11.65  . carvedilol (COREG) 25 MG tablet Take 1 tablet by mouth twice daily  . Cholecalciferol (VITAMIN D-3) 1000 units CAPS Take 1,000 Units by mouth daily.  . diclofenac sodium (VOLTAREN) 1 % GEL Apply 2 g topically 4 (four) times daily as needed (pain).  . DROPLET PEN NEEDLES 31G X 8 MM MISC USE  DAILY WITH VICTOZA AND/OR NOVOLOG.   . furosemide (LASIX) 40 MG tablet Take one tablet (54m) by mouth on Saturday and Sunday only.  . furosemide (LASIX) 80 MG tablet Take one tablet (870m by mouth Monday through Friday only.  . hydrALAZINE (APRESOLINE) 50 MG tablet Take 2 tablet ( 100 mg) in morning and evening, and 50 mg at lunch time, may take an extra 100 mg if need B/P>150 at lunch  . HYDROcodone-acetaminophen (NORCO/VICODIN) 5-325 MG tablet Take 1 tablet by mouth every 6 (six) hours as needed for moderate pain.  . isosorbide mononitrate (IMDUR) 60 MG 24 hr tablet Take 1 tablet (60 mg total) by mouth daily.  . Marland KitchenamoTRIgine (LAMICTAL) 100 MG tablet Take 1 tablet (100 mg total) by mouth 2 (two) times daily.  . Menthol, Topical Analgesic, 3.5 % GEL Apply 1 application topically as needed (for neuropathy in feet).  . metolazone (ZAROXOLYN) 2.5 MG tablet Take 1 tablet (2.5 mg total) by mouth See admin instructions. Take 1 tablet Monday and Friday only if weight is greater or equal to 269  . Multiple Vitamins-Minerals (MULTIVITAMIN WITH MINERALS) tablet Take 1 tablet by mouth daily.  . Marland KitchenZEMPIC, 1 MG/DOSE, 2 MG/1.5ML SOPN INJECT 1 MG INTO THE SKIN ONCE A WEEK  . Pancrelipase, Lip-Prot-Amyl, (ZENPEP) 25000-79000 units CPEP Take 1-2 capsules by mouth See admin instructions. Take 2 capsules with meals and 1 capsule with snacks  . potassium chloride SA (K-DUR,KLOR-CON) 20 MEQ tablet Take 2 tablets by mouth daily. On the day you take metolazone, take extra tablet for a total of 3 (6052m  . pregabalin (LYRICA) 100 MG capsule Take 1 capsule by mouth twice daily  . sertraline (ZOLOFT) 50 MG tablet TAKE 1 TABLET EVERY DAY   No facility-administered encounter medications on file as of 10/22/2018.     Activities of Daily Living In your present state of health, do you have any difficulty performing the following activities: 10/22/2018 08/22/2018  Hearing? N N  Vision? Y N  Difficulty concentrating or making  decisions? Y N  Comment some forgetfulness -  Walking or climbing stairs? Y Y  Comment - Patient has pain in knee  Dressing or bathing? Y N  Comment due to arm pain -  Doing errands, shopping? Y YTempie Donningomment sister with at appointments Sister takes patient for visits  Preparing Food and eating ? N Y  Comment - sister helps prepare food  Using the Toilet? N N  In the past six months, have you accidently leaked urine? Y YTempie Donningomment wears a pad some stress incont  Do you have problems with loss of bowel control? N N  Managing your Medications? Y YTempie Donningomment sister sometimes sets up sister helps  Managing your Finances? Y Y  Comment sister does sister takes care of finances  Housekeeping or managing your Housekeeping? N Y  Comment - lives with sister  Some recent data might be hidden    Patient Care Team: SanGlendale ChardD as PCP - General (Internal Medicine) HarLeonie ManD as PCP - Cardiology (Cardiology) AntConni SlipperP as Referring Physician (Nurse Practitioner) OutArta Silence  MD as Attending Physician (Gastroenterology) Elayne Snare, MD as Attending Physician (Endocrinology) Matilde Bash, MD as Referring Physician (Neurology) Inocencio Homes, DPM as Consulting Physician (Podiatry) Pleasant, Eppie Gibson, RN as Keener Management    Assessment:   This is a routine wellness examination for Larna.  Exercise Activities and Dietary recommendations Current Exercise Habits: Home exercise routine, Type of exercise: walking, Time (Minutes): 30, Frequency (Times/Week): 2, Weekly Exercise (Minutes/Week): 60  Goals    . Weight (lb) < 200 lb (90.7 kg) (pt-stated)     Wants to be 165 pounds       Fall Risk Fall Risk  10/22/2018 10/22/2018 09/02/2018 07/22/2018 07/21/2018  Falls in the past year? 0 0 _0 Number falls in past yr: - - 0 0 0  Injury with Fall? - - 0 0 0  Risk Factor Category  - - - - -  Risk for fall due to : Medication side effect;Impaired  balance/gait - - History of fall(s) -  Follow up Education provided;Falls prevention discussed - - Falls evaluation completed -   Is the patient's home free of loose throw rugs in walkways, pet beds, electrical cords, etc?   yes      Grab bars in the bathroom? no      Handrails on the stairs?   yes      Adequate lighting?   yes  Timed Get Up and Go performed: n/a  Depression Screen PHQ 2/9 Scores 10/22/2018 10/22/2018 09/02/2018 08/22/2018  PHQ - 2 Score 0 0 0 0  PHQ- 9 Score 3 - - -     Cognitive Function     6CIT Screen 10/22/2018  What Year? 0 points  What month? 0 points  What time? 0 points  Count back from 20 2 points  Months in reverse 0 points  Repeat phrase 0 points  Total Score 2    Immunization History  Administered Date(s) Administered  . Influenza Split 04/11/2011, 06/30/2013  . Influenza, High Dose Seasonal PF 02/14/2017, 04/24/2018  . Influenza-Unspecified 04/07/2014  . Tdap 05/18/2012  . Zoster 04/14/2014    Qualifies for Shingles Vaccine? yes  Screening Tests Health Maintenance  Topic Date Due  . OPHTHALMOLOGY EXAM  04/04/1956  . FOOT EXAM  11/06/2014  . HEMOGLOBIN A1C  01/19/2019  . INFLUENZA VACCINE  01/24/2019  . MAMMOGRAM  07/29/2020  . TETANUS/TDAP  05/18/2022  . COLONOSCOPY  06/18/2027  . DEXA SCAN  Completed  . Hepatitis C Screening  Completed  . PNA vac Low Risk Adult  Completed    Cancer Screenings: Lung: Low Dose CT Chest recommended if Age 61-80 years, 30 pack-year currently smoking OR have quit w/in 15years. Patient does not qualify. Breast:  Up to date on Mammogram? Yes   Up to date of Bone Density/Dexa? Yes Colorectal: up to date  Additional Screenings: : Hepatitis C Screening: 09/10/2013 <0.1     Plan:   Wants to get to 165 pounds  I have personally reviewed and noted the following in the patient's chart:   . Medical and social history . Use of alcohol, tobacco or illicit drugs  . Current medications and supplements .  Functional ability and status . Nutritional status . Physical activity . Advanced directives . List of other physicians . Hospitalizations, surgeries, and ER visits in previous 12 months . Vitals . Screenings to include cognitive, depression, and falls . Referrals and appointments  In addition, I have reviewed and discussed with patient certain  preventive protocols, quality metrics, and best practice recommendations. A written personalized care plan for preventive services as well as general preventive health recommendations were provided to patient.     [name], LPN  06/26/5850

## 2018-10-22 NOTE — Patient Instructions (Signed)
Diabetes Mellitus and Nutrition, Adult  When you have diabetes (diabetes mellitus), it is very important to have healthy eating habits because your blood sugar (glucose) levels are greatly affected by what you eat and drink. Eating healthy foods in the appropriate amounts, at about the same times every day, can help you:  · Control your blood glucose.  · Lower your risk of heart disease.  · Improve your blood pressure.  · Reach or maintain a healthy weight.  Every person with diabetes is different, and each person has different needs for a meal plan. Your health care provider may recommend that you work with a diet and nutrition specialist (dietitian) to make a meal plan that is best for you. Your meal plan may vary depending on factors such as:  · The calories you need.  · The medicines you take.  · Your weight.  · Your blood glucose, blood pressure, and cholesterol levels.  · Your activity level.  · Other health conditions you have, such as heart or kidney disease.  How do carbohydrates affect me?  Carbohydrates, also called carbs, affect your blood glucose level more than any other type of food. Eating carbs naturally raises the amount of glucose in your blood. Carb counting is a method for keeping track of how many carbs you eat. Counting carbs is important to keep your blood glucose at a healthy level, especially if you use insulin or take certain oral diabetes medicines.  It is important to know how many carbs you can safely have in each meal. This is different for every person. Your dietitian can help you calculate how many carbs you should have at each meal and for each snack.  Foods that contain carbs include:  · Bread, cereal, rice, pasta, and crackers.  · Potatoes and corn.  · Peas, beans, and lentils.  · Milk and yogurt.  · Fruit and juice.  · Desserts, such as cakes, cookies, ice cream, and candy.  How does alcohol affect me?  Alcohol can cause a sudden decrease in blood glucose (hypoglycemia),  especially if you use insulin or take certain oral diabetes medicines. Hypoglycemia can be a life-threatening condition. Symptoms of hypoglycemia (sleepiness, dizziness, and confusion) are similar to symptoms of having too much alcohol.  If your health care provider says that alcohol is safe for you, follow these guidelines:  · Limit alcohol intake to no more than 1 drink per day for nonpregnant women and 2 drinks per day for men. One drink equals 12 oz of beer, 5 oz of wine, or 1½ oz of hard liquor.  · Do not drink on an empty stomach.  · Keep yourself hydrated with water, diet soda, or unsweetened iced tea.  · Keep in mind that regular soda, juice, and other mixers may contain a lot of sugar and must be counted as carbs.  What are tips for following this plan?    Reading food labels  · Start by checking the serving size on the "Nutrition Facts" label of packaged foods and drinks. The amount of calories, carbs, fats, and other nutrients listed on the label is based on one serving of the item. Many items contain more than one serving per package.  · Check the total grams (g) of carbs in one serving. You can calculate the number of servings of carbs in one serving by dividing the total carbs by 15. For example, if a food has 30 g of total carbs, it would be equal to 2   servings of carbs.  · Check the number of grams (g) of saturated and trans fats in one serving. Choose foods that have low or no amount of these fats.  · Check the number of milligrams (mg) of salt (sodium) in one serving. Most people should limit total sodium intake to less than 2,300 mg per day.  · Always check the nutrition information of foods labeled as "low-fat" or "nonfat". These foods may be higher in added sugar or refined carbs and should be avoided.  · Talk to your dietitian to identify your daily goals for nutrients listed on the label.  Shopping  · Avoid buying canned, premade, or processed foods. These foods tend to be high in fat, sodium,  and added sugar.  · Shop around the outside edge of the grocery store. This includes fresh fruits and vegetables, bulk grains, fresh meats, and fresh dairy.  Cooking  · Use low-heat cooking methods, such as baking, instead of high-heat cooking methods like deep frying.  · Cook using healthy oils, such as olive, canola, or sunflower oil.  · Avoid cooking with butter, cream, or high-fat meats.  Meal planning  · Eat meals and snacks regularly, preferably at the same times every day. Avoid going long periods of time without eating.  · Eat foods high in fiber, such as fresh fruits, vegetables, beans, and whole grains. Talk to your dietitian about how many servings of carbs you can eat at each meal.  · Eat 4-6 ounces (oz) of lean protein each day, such as lean meat, chicken, fish, eggs, or tofu. One oz of lean protein is equal to:  ? 1 oz of meat, chicken, or fish.  ? 1 egg.  ? ¼ cup of tofu.  · Eat some foods each day that contain healthy fats, such as avocado, nuts, seeds, and fish.  Lifestyle  · Check your blood glucose regularly.  · Exercise regularly as told by your health care provider. This may include:  ? 150 minutes of moderate-intensity or vigorous-intensity exercise each week. This could be brisk walking, biking, or water aerobics.  ? Stretching and doing strength exercises, such as yoga or weightlifting, at least 2 times a week.  · Take medicines as told by your health care provider.  · Do not use any products that contain nicotine or tobacco, such as cigarettes and e-cigarettes. If you need help quitting, ask your health care provider.  · Work with a counselor or diabetes educator to identify strategies to manage stress and any emotional and social challenges.  Questions to ask a health care provider  · Do I need to meet with a diabetes educator?  · Do I need to meet with a dietitian?  · What number can I call if I have questions?  · When are the best times to check my blood glucose?  Where to find more  information:  · American Diabetes Association: diabetes.org  · Academy of Nutrition and Dietetics: www.eatright.org  · National Institute of Diabetes and Digestive and Kidney Diseases (NIH): www.niddk.nih.gov  Summary  · A healthy meal plan will help you control your blood glucose and maintain a healthy lifestyle.  · Working with a diet and nutrition specialist (dietitian) can help you make a meal plan that is best for you.  · Keep in mind that carbohydrates (carbs) and alcohol have immediate effects on your blood glucose levels. It is important to count carbs and to use alcohol carefully.  This information is not intended to   replace advice given to you by your health care provider. Make sure you discuss any questions you have with your health care provider.  Document Released: 03/08/2005 Document Revised: 01/09/2017 Document Reviewed: 07/16/2016  Elsevier Interactive Patient Education © 2019 [employer]

## 2018-10-22 NOTE — Patient Instructions (Addendum)
[name] , Thank you for taking time to come for your Medicare Wellness Visit. I appreciate your ongoing commitment to your health goals. Please review the following plan we discussed and let me know if I can assist you in the future.   Screening recommendations/referrals: Colonoscopy: 05/2017 Mammogram: 07/2018 Bone Density: 03/2013 Recommended yearly ophthalmology/optometry visit for glaucoma screening and checkup Recommended yearly dental visit for hygiene and checkup  Vaccinations: Influenza vaccine: 03/2018 Pneumococcal vaccine: 12/2015 Tdap vaccine: 08/2013 Shingles vaccine: discussed    Advanced directives: Please bring a copy of your POA (Power of [place]) and/or Living Will to your next appointment.    Conditions/risks identified: Obesity  Next appointment: 01/21/2019 at 11:30   Preventive Care 16 Years and Older, Female Preventive care refers to lifestyle choices and visits with your health care provider that can promote health and wellness. What does preventive care include?  A yearly physical exam. This is also called an annual well check.  Dental exams once or twice a year.  Routine eye exams. Ask your health care provider how often you should have your eyes checked.  Personal lifestyle choices, including:  Daily care of your teeth and gums.  Regular physical activity.  Eating a healthy diet.  Avoiding tobacco and drug use.  Limiting alcohol use.  Practicing safe sex.  Taking low-dose aspirin every day.  Taking vitamin and mineral supplements as recommended by your health care provider. What happens during an annual well check? The services and screenings done by your health care provider during your annual well check will depend on your age, overall health, lifestyle risk factors, and family history of disease. Counseling  Your health care provider may ask you questions about your:  Alcohol use.  Tobacco use.  Drug use.  Emotional  well-being.  Home and relationship well-being.  Sexual activity.  Eating habits.  History of falls.  Memory and ability to understand (cognition).  Work and work Statistician.  Reproductive health. Screening  You may have the following tests or measurements:  Height, weight, and BMI.  Blood pressure.  Lipid and cholesterol levels. These may be checked every 5 years, or more frequently if you are over 11 years old.  Skin check.  Lung cancer screening. You may have this screening every year starting at age 21 if you have a 30-pack-year history of smoking and currently smoke or have quit within the past 15 years.  Fecal occult blood test (FOBT) of the stool. You may have this test every year starting at age 61.  Flexible sigmoidoscopy or colonoscopy. You may have a sigmoidoscopy every 5 years or a colonoscopy every 10 years starting at age 73.  Hepatitis C blood test.  Hepatitis B blood test.  Sexually transmitted disease (STD) testing.  Diabetes screening. This is done by checking your blood sugar (glucose) after you have not eaten for a while (fasting). You may have this done every 1-3 years.  Bone density scan. This is done to screen for osteoporosis. You may have this done starting at age 61.  Mammogram. This may be done every 1-2 years. Talk to your health care provider about how often you should have regular mammograms. Talk with your health care provider about your test results, treatment options, and if necessary, the need for more tests. Vaccines  Your health care provider may recommend certain vaccines, such as:  Influenza vaccine. This is recommended every year.  Tetanus, diphtheria, and acellular pertussis (Tdap, Td) vaccine. You may need a Td booster  every 10 years.  Zoster vaccine. You may need this after age 80.  Pneumococcal 13-valent conjugate (PCV13) vaccine. One dose is recommended after age 88.  Pneumococcal polysaccharide (PPSV23) vaccine. One  dose is recommended after age 62. Talk to your health care provider about which screenings and vaccines you need and how often you need them. This information is not intended to replace advice given to you by your health care provider. Make sure you discuss any questions you have with your health care provider. Document Released: 07/08/2015 Document Revised: 02/29/2016 Document Reviewed: 04/12/2015 Elsevier Interactive Patient Education  2017 [place] Prevention in the Home Falls can cause injuries. They can happen to people of all ages. There are many things you can do to make your home safe and to help prevent falls. What can I do on the outside of my home?  Regularly fix the edges of walkways and driveways and fix any cracks.  Remove anything that might make you trip as you walk through a door, such as a raised step or threshold.  Trim any bushes or trees on the path to your home.  Use bright outdoor lighting.  Clear any walking paths of anything that might make someone trip, such as rocks or tools.  Regularly check to see if handrails are loose or broken. Make sure that both sides of any steps have handrails.  Any raised decks and porches should have guardrails on the edges.  Have any leaves, snow, or ice cleared regularly.  Use sand or salt on walking paths during winter.  Clean up any spills in your garage right away. This includes oil or grease spills. What can I do in the bathroom?  Use night lights.  Install grab bars by the toilet and in the tub and shower. Do not use towel bars as grab bars.  Use non-skid mats or decals in the tub or shower.  If you need to sit down in the shower, use a plastic, non-slip stool.  Keep the floor dry. Clean up any water that spills on the floor as soon as it happens.  Remove soap buildup in the tub or shower regularly.  Attach bath mats securely with double-sided non-slip rug tape.  Do not have throw rugs and other  things on the floor that can make you trip. What can I do in the bedroom?  Use night lights.  Make sure that you have a light by your bed that is easy to reach.  Do not use any sheets or blankets that are too big for your bed. They should not hang down onto the floor.  Have a firm chair that has side arms. You can use this for support while you get dressed.  Do not have throw rugs and other things on the floor that can make you trip. What can I do in the kitchen?  Clean up any spills right away.  Avoid walking on wet floors.  Keep items that you use a lot in easy-to-reach places.  If you need to reach something above you, use a strong step stool that has a grab bar.  Keep electrical cords out of the way.  Do not use floor polish or wax that makes floors slippery. If you must use wax, use non-skid floor wax.  Do not have throw rugs and other things on the floor that can make you trip. What can I do with my stairs?  Do not leave any items on the stairs.  Make  sure that there are handrails on both sides of the stairs and use them. Fix handrails that are broken or loose. Make sure that handrails are as long as the stairways.  Check any carpeting to make sure that it is firmly attached to the stairs. Fix any carpet that is loose or worn.  Avoid having throw rugs at the top or bottom of the stairs. If you do have throw rugs, attach them to the floor with carpet tape.  Make sure that you have a light switch at the top of the stairs and the bottom of the stairs. If you do not have them, ask someone to add them for you. What else can I do to help prevent falls?  Wear shoes that:  Do not have high heels.  Have rubber bottoms.  Are comfortable and fit you well.  Are closed at the toe. Do not wear sandals.  If you use a stepladder:  Make sure that it is fully opened. Do not climb a closed stepladder.  Make sure that both sides of the stepladder are locked into place.  Ask  someone to hold it for you, if possible.  Clearly mark and make sure that you can see:  Any grab bars or handrails.  First and last steps.  Where the edge of each step is.  Use tools that help you move around (mobility aids) if they are needed. These include:  Canes.  Walkers.  Scooters.  Crutches.  Turn on the lights when you go into a dark area. Replace any light bulbs as soon as they burn out.  Set up your furniture so you have a clear path. Avoid moving your furniture around.  If any of your floors are uneven, fix them.  If there are any pets around you, be aware of where they are.  Review your medicines with your doctor. Some medicines can make you feel dizzy. This can increase your chance of falling. Ask your doctor what other things that you can do to help prevent falls. This information is not intended to replace advice given to you by your health care provider. Make sure you discuss any questions you have with your health care provider. Document Released: 04/07/2009 Document Revised: 11/17/2015 Document Reviewed: 07/16/2014 Elsevier Interactive Patient Education  2017 [employer].

## 2018-10-23 ENCOUNTER — Other Ambulatory Visit: Payer: Self-pay | Admitting: Cardiology

## 2018-10-23 LAB — CMP14+EGFR
ALT: 13 IU/L (ref 0–32)
AST: 13 IU/L (ref 0–40)
Albumin/Globulin Ratio: 1.4 (ref 1.2–2.2)
Albumin: 4.3 g/dL (ref 3.8–4.8)
Alkaline Phosphatase: 98 IU/L (ref 39–117)
BUN/Creatinine Ratio: 19 (ref 12–28)
BUN: 17 mg/dL (ref 8–27)
Bilirubin Total: 0.4 mg/dL (ref 0.0–1.2)
CO2: 26 mmol/L (ref 20–29)
Calcium: 9.4 mg/dL (ref 8.7–10.3)
Chloride: 96 mmol/L (ref 96–106)
Creatinine, Ser: 0.9 mg/dL (ref 0.57–1.00)
GFR calc Af Amer: 75 mL/min/{1.73_m2} (ref >59)
GFR calc non Af Amer: 65 mL/min/{1.73_m2} (ref >59)
Globulin, Total: 3.1 g/dL (ref 1.5–4.5)
Glucose: 120 mg/dL — ABNORMAL HIGH (ref 65–99)
Potassium: 4.1 mmol/L (ref 3.5–5.2)
Sodium: 138 mmol/L (ref 134–144)
Total Protein: 7.4 g/dL (ref 6.0–8.5)

## 2018-10-23 LAB — LIPID PANEL
Chol/HDL Ratio: 2.1 ratio (ref 0.0–4.4)
Cholesterol, Total: 145 mg/dL (ref 100–199)
HDL: 70 mg/dL (ref >39)
LDL Calculated: 65 mg/dL (ref 0–99)
Triglycerides: 52 mg/dL (ref 0–149)
VLDL Cholesterol Cal: 10 mg/dL (ref 5–40)

## 2018-10-23 LAB — HEMOGLOBIN A1C
Est. average glucose Bld gHb Est-mCnc: 183 mg/dL
Hgb A1c MFr Bld: 8 % — ABNORMAL HIGH (ref 4.8–5.6)

## 2018-10-24 NOTE — Telephone Encounter (Signed)
k-dur refilled. 

## 2018-10-25 NOTE — Progress Notes (Signed)
Subjective:     Patient ID: [name] , female    DOB: October 22, 1945 , 73 y.o.   MRN: [id number]   Chief Complaint  Patient presents with  . Diabetes  . Hypertension    HPI  She is here today for a diabetes check. She admits that she is still not exercising as she should. She also states that her sister (whom she lives with) continues to make desserts - so she eats them.   Diabetes  She presents for her follow-up diabetic visit. She has type 2 diabetes mellitus. Her disease course has been improving. There are no hypoglycemic associated symptoms. Pertinent negatives for diabetes include no blurred vision and no chest pain. There are no hypoglycemic complications. Diabetic complications include nephropathy. Risk factors for coronary artery disease include diabetes mellitus, dyslipidemia, hypertension, obesity, post-menopausal and sedentary lifestyle. She is compliant with treatment some of the time. Her weight is fluctuating minimally. She is following a generally healthy diet. She never participates in exercise. Her home blood glucose trend is decreasing steadily. Her breakfast blood glucose is taken between 8-9 am. Her breakfast blood glucose range is generally 130-140 mg/dl. An ACE inhibitor/angiotensin II receptor blocker is being taken. Eye exam is not current.  Hypertension  This is a chronic problem. The current episode started more than 1 year ago. The problem has been gradually improving since onset. The problem is controlled. Pertinent negatives include no blurred vision, chest pain, palpitations or shortness of breath. Hypertensive end-organ damage includes kidney disease. Identifiable causes of hypertension include sleep apnea.     Past Medical History:  Diagnosis Date  . Abnormal liver function     in the past.  . Anemia   . Arthritis    all over  . Bruises easily   . Cataract    right eye;immature  . Chronic back pain    stenosis  . Chronic cough   . Chronic kidney  disease   . COPD (chronic obstructive pulmonary disease) ([place])   . Demand myocardial infarction [facility]) 2012   Demand Infarction in setting of Pancreatitis --> mild Troponin elevation, NON-OBSTRUCTIVE CAD  . Depression    takes Abilify daily as well as Zoloft  . Diastolic heart failure 5397   Grade 1 diastolic Dysfunction by Echo   . Diverticulosis   . DM (diabetes mellitus) ([place])    takes Victoza daily as well as Lantus and Humalog  . Empty sella ([place])    on MRI in 2009.  . Glaucoma   . Headache(784.0)    last migraine-4-8yr ago  . History of blood transfusion    no abnormal reaction noted  . History of colon polyps    benign  . HTN (hypertension)    takes BKinder Morgan Energydaily  . Hyperlipidemia    takes Lipitor daily  . Joint swelling   . Nocturia   . Ob[place]entilation syndrome ([place]   . Obstructive sleep apnea 02/2018   Notably improved split-night study with weight loss from 270 (2017) down to 250 pounds (2019).  . Pancreatitis    takes Pancrelipase daily  . Parkinson's disease ([place]    takes Sinemet daily  . Peripheral neuropathy   . Pneumonia 2012  . Seizures ([place]    takes Lamictal daily and Primidone nightly;last seizure 2wks ago  . Urinary urgency    With increased frequency  . Varicose veins of both lower extremities with pain    With edema.  Takes daily Lasix  Family History  Problem Relation Age of Onset  . Allergies Father   . Heart disease Father        before 9  . Heart failure Father   . Hypertension Father   . Hyperlipidemia Father   . Heart disease Mother   . Diabetes Mother   . Hypertension Mother   . Hyperlipidemia Mother   . Hypertension Sister   . Heart disease Sister        before 50  . Hyperlipidemia Sister   . Diabetes Brother   . Hypertension Brother   . Diabetes Sister   . Hypertension Sister   . Diabetes Son   . Hypertension Son   . Cancer Other      Current Outpatient Medications:  .  acetaminophen  (TYLENOL) 500 MG tablet, Take 500 mg by mouth every 4 (four) hours as needed (for pain). , Disp: , Rfl:  .  albuterol (PROVENTIL HFA;VENTOLIN HFA) 108 (90 Base) MCG/ACT inhaler, Inhale 1-2 puffs into the lungs every 6 (six) hours as needed for wheezing or shortness of breath (DOE WHEEZING SOB)., Disp: 1 Inhaler, Rfl: 1 .  atorvastatin (LIPITOR) 10 MG tablet, TAKE 1 TABLET EVERY DAY, Disp: 90 tablet, Rfl: 1 .  B Complex-C (B-COMPLEX WITH VITAMIN C) tablet, Take 1 tablet by mouth daily., Disp: , Rfl:  .  Blood Glucose Monitoring Suppl (ACCU-CHEK NANO SMARTVIEW) W/DEVICE KIT, Use to check blood sugar 3 times per day dx code E11.65, Disp: 1 kit, Rfl: 0 .  carvedilol (COREG) 25 MG tablet, Take 1 tablet by mouth twice daily, Disp: 180 tablet, Rfl: 0 .  Cholecalciferol (VITAMIN D-3) 1000 units CAPS, Take 1,000 Units by mouth daily., Disp: , Rfl:  .  diclofenac sodium (VOLTAREN) 1 % GEL, Apply 2 g topically 4 (four) times daily as needed (pain)., Disp: , Rfl:  .  DROPLET PEN NEEDLES 31G X 8 MM MISC, USE DAILY WITH VICTOZA AND/OR NOVOLOG. , Disp: 400 each, Rfl: 1 .  furosemide (LASIX) 40 MG tablet, Take one tablet (83m) by mouth on Saturday and Sunday only., Disp: 180 tablet, Rfl: 3 .  furosemide (LASIX) 80 MG tablet, Take one tablet (816m by mouth Monday through Friday only., Disp: 90 tablet, Rfl: 3 .  hydrALAZINE (APRESOLINE) 50 MG tablet, Take 2 tablet ( 100 mg) in morning and evening, and 50 mg at lunch time, may take an extra 100 mg if need B/P>150 at lunch, Disp: 210 tablet, Rfl: 5 .  HYDROcodone-acetaminophen (NORCO/VICODIN) 5-325 MG tablet, Take 1 tablet by mouth every 6 (six) hours as needed for moderate pain., Disp: 30 tablet, Rfl: 0 .  isosorbide mononitrate (IMDUR) 60 MG 24 hr tablet, Take 1 tablet (60 mg total) by mouth daily., Disp: 90 tablet, Rfl: 3 .  lamoTRIgine (LAMICTAL) 100 MG tablet, Take 1 tablet (100 mg total) by mouth 2 (two) times daily., Disp: 180 tablet, Rfl: 3 .  Menthol, Topical  Analgesic, 3.5 % GEL, Apply 1 application topically as needed (for neuropathy in feet)., Disp: 120 mL, Rfl: 0 .  metolazone (ZAROXOLYN) 2.5 MG tablet, Take 1 tablet (2.5 mg total) by mouth See admin instructions. Take 1 tablet Monday and Friday only if weight is greater or equal to 269, Disp: 10 tablet, Rfl: 0 .  Multiple Vitamins-Minerals (MULTIVITAMIN WITH MINERALS) tablet, Take 1 tablet by mouth daily., Disp: , Rfl:  .  OZEMPIC, 1 MG/DOSE, 2 MG/1.5ML SOPN, INJECT 1 MG INTO THE SKIN ONCE A WEEK, Disp: 12 mL, Rfl: 0 .  Pancrelipase, Lip-Prot-Amyl, (ZENPEP) 25000-79000 units CPEP, Take 1-2 capsules by mouth See admin instructions. Take 2 capsules with meals and 1 capsule with snacks, Disp: , Rfl:  .  pregabalin (LYRICA) 100 MG capsule, Take 1 capsule by mouth twice daily, Disp: 180 capsule, Rfl: 1 .  sertraline (ZOLOFT) 50 MG tablet, TAKE 1 TABLET EVERY DAY, Disp: 90 tablet, Rfl: 2 .  potassium chloride SA (K-DUR) 20 MEQ tablet, TAKE 2 TABLETS ONCE DAILY. BUT ON THE DAY YOU  TAKE METOLAZONE AN  EXTRA TABLET FOR A TOTAL OF 3 TABLETS (60MEQ), Disp: 204 tablet, Rfl: 1   Allergies  Allergen Reactions  . Other Anaphylaxis and Rash    Bleach  . Penicillins Hives    Has patient had a PCN reaction causing immediate rash, facial/tongue/throat swelling, SOB or lightheadedness with hypotension: Yes Has patient had a PCN reaction causing severe rash involving mucus membranes or skin necrosis: No Has patient had a PCN reaction that required hospitalization: No Has patient had a PCN reaction occurring within the last 10 years: No If all of the above answers are "NO", then may proceed with Cephalosporin use.   . Sulfa Antibiotics Hives  . Aspirin Other (See Comments)     On aspirin 81 mg - Rectal bleeding in dec 2018  . Codeine     Headache and makes the patient feel "off"     Review of Systems  Constitutional: Negative.   Eyes: Negative for blurred vision.  Respiratory: Negative.  Negative for  shortness of breath.   Cardiovascular: Negative.  Negative for chest pain and palpitations.  Gastrointestinal: Negative.   Musculoskeletal: Positive for arthralgias (still having R wrist pain. pain w/ movement. denies trauma/fall. she feels her sx are getting worse. wants to see a specialist. never got wrist brace).  Neurological: Negative.   Psychiatric/Behavioral: Negative.      Today's Vitals   10/22/18 1041  BP: 134/70  Pulse: 70  Temp: 98.2 F (36.8 C)  TempSrc: Oral  SpO2: 97%  Weight: 239 lb 12.8 oz (108.8 kg)  Height: _0  (1.549 m)  PainSc: 8   PainLoc: Arm   Body mass index is 45.31 kg/m.   Objective:  Physical Exam Vitals signs and nursing note reviewed.  Constitutional:      Appearance: Normal appearance.  HENT:     Head: Normocephalic and atraumatic.  Cardiovascular:     Rate and Rhythm: Normal rate and regular rhythm.     Heart sounds: Normal heart sounds.  Pulmonary:     Effort: Pulmonary effort is normal.     Breath sounds: Normal breath sounds.  Musculoskeletal:     Right wrist: She exhibits tenderness.     Comments: There is pain with movement of R wrist. No overlying erythema/warmth  Skin:    General: Skin is warm.  Neurological:     General: No focal deficit present.     Mental Status: She is alert.  Psychiatric:        Mood and Affect: Mood normal.        Behavior: Behavior normal.         Assessment And Plan:     1. Type 2 diabetes mellitus with stage 2 chronic kidney disease, with long-term current use of insulin ([place])  I will refer her for eye exam. I will check labs as listed below. Importance of medication, dietary and exercise compliance was stressed to the patient.   - Ambulatory referral to Ophthalmology - Lipid panel - CMP14+EGFR -  Hemoglobin A1c  2. Hypertensive heart disease with chronic diastolic congestive heart failure ([place])  Fair control. She is encouraged to limit her salt intake.   3. Right wrist pain  I will  refer her to Ortho for further evaluation. She is encouraged to purchase the wrist splint as well.   - Ambulatory referral to Hand Surgery  4. Class 3 severe obesity due to excess calories with serious comorbidity and body mass index (BMI) of 45.0 to 49.9 in adult [facility])  Again, importance of regular exercise was discussed with the patient. She is encouraged to walk her driveway once every other day. After 1-2 weeks, increase to walking two laps in the driveway four days per week. She is advised to gradually work up to 150 minutes per week.   [name] Greenland, MD    THE PATIENT IS ENCOURAGED TO PRACTICE SOCIAL DISTANCING DUE TO THE COVID-19 PANDEMIC.

## 2018-10-29 ENCOUNTER — Ambulatory Visit: Payer: Medicare HMO

## 2018-10-29 ENCOUNTER — Ambulatory Visit: Payer: Medicare HMO | Admitting: Internal Medicine

## 2018-11-01 ENCOUNTER — Other Ambulatory Visit: Payer: Self-pay | Admitting: Nurse Practitioner

## 2018-11-02 DIAGNOSIS — G4733 Obstructive sleep apnea (adult) (pediatric): Secondary | ICD-10-CM | POA: Diagnosis not present

## 2018-11-04 ENCOUNTER — Other Ambulatory Visit: Payer: Self-pay

## 2018-11-04 MED ORDER — INSULIN GLARGINE (1 UNIT DIAL) 300 UNIT/ML ~~LOC~~ SOPN: 36.0000 [IU] | PEN_INJECTOR | Freq: Every day | SUBCUTANEOUS | 1 refills | Status: DC

## 2018-11-06 ENCOUNTER — Other Ambulatory Visit: Payer: Self-pay

## 2018-11-06 MED ORDER — INSULIN GLARGINE (1 UNIT DIAL) 300 UNIT/ML ~~LOC~~ SOPN: 36.0000 [IU] | PEN_INJECTOR | Freq: Every day | SUBCUTANEOUS | 2 refills | Status: DC

## 2018-11-07 ENCOUNTER — Telehealth: Payer: Self-pay | Admitting: Adult Health

## 2018-11-07 DIAGNOSIS — G4733 Obstructive sleep apnea (adult) (pediatric): Secondary | ICD-10-CM | POA: Diagnosis not present

## 2018-11-07 NOTE — Telephone Encounter (Signed)
Patient reviewed consent and did pre-registration with A. Featherstone. Encounter closed

## 2018-11-07 NOTE — Telephone Encounter (Signed)
Mychart pending, smartphone, consent, pre reg complete 11/07/18 AF

## 2018-11-07 NOTE — Telephone Encounter (Signed)
New Message  patient has smart phone to do virtual visit please call [phone] to get consent and help setup

## 2018-11-08 NOTE — Progress Notes (Signed)
Virtual Visit via Video Note   This visit type was conducted due to national recommendations for restrictions regarding the COVID-19 Pandemic (e.g. social distancing) in an effort to limit this patient's exposure and mitigate transmission in our community.  Due to her co-morbid illnesses, this patient is at least at moderate risk for complications without adequate follow up.  This format is felt to be most appropriate for this patient at this time.  All issues noted in this document were discussed and addressed.  A limited physical exam was performed with this format.  Please refer to the patient's chart for her consent to telehealth for [facility].   Date:  11/10/2018   ID:  [name], DOB May 03, 1946, MRN [id number]  Patient Location: Home Provider Location: Home  PCP:  [name], MD  Cardiologist:  Glenetta Hew, MD  Electrophysiologist:  None   Evaluation Performed:  Follow-Up Visit  Chief Complaint:  Hypertension Follow up  History of Present Illness:    QUINNLEY COLASURDO is a 73 y.o. female for ongoing assessment and management of diastolic CHF, with cath in 04/2018 revealing normal coronary arteries, hypertension, palpitations, CVA with other history to include seizure disorder, morbid obesity and hypoventilation syndrome. She is followed by neurology and is on HS oxygen. She sleeps in a recliner. She was medically compliant when seen last by Dr. Ellyn Hack on 07/08/2018 with lasix 80 mg M-F and 40 mg on Sat and Sun.  On last office visit blood pressure was not well controlled, she was therefore increased on hydralazine to 100 mg 3 times a day, but only 50 mg for midday dose to allow additional 50 mg as needed for elevated blood pressure.  Plans were to convert her back to ACE inhibitor or ARB in the future.  She was encouraged to continue weight loss as she had lost 20 pounds over the last year.  It was recommended that she have a repeat echocardiogram once completely  euvolemic.  Lower extremity edema was thought to be related to venous stasis and she was advised to wear support stockings and leg elevation.  No changes were made on diuretic therapy.  She is doing well with the exception of elevated BP in the am and pm. She denies chest pain and DOE. She has lost 20 lbs and is walking everyday. She is medically complaint but admits to eating some sweet and salty foods sometimes, especially with staying home for COVID restrictions. She takes her BP everyday and records it along with her weight.  The patient does not have symptoms concerning for COVID-19 infection (fever, chills, cough, or new shortness of breath).    Past Medical History:  Diagnosis Date  . Abnormal liver function     in the past.  . Anemia   . Arthritis    all over  . Bruises easily   . Cataract    right eye;immature  . Chronic back pain    stenosis  . Chronic cough   . Chronic kidney disease   . COPD (chronic obstructive pulmonary disease) ([place])   . Demand myocardial infarction [facility]) 2012   Demand Infarction in setting of Pancreatitis --> mild Troponin elevation, NON-OBSTRUCTIVE CAD  . Depression    takes Abilify daily as well as Zoloft  . Diastolic heart failure 0998   Grade 1 diastolic Dysfunction by Echo   . Diverticulosis   . DM (diabetes mellitus) ([place])    takes Victoza daily as well as Lantus and Humalog  .  Empty sella ([place])    on MRI in 2009.  . Glaucoma   . Headache(784.0)    last migraine-4-46yr ago  . History of blood transfusion    no abnormal reaction noted  . History of colon polyps    benign  . HTN (hypertension)    takes BKinder Morgan Energydaily  . Hyperlipidemia    takes Lipitor daily  . Joint swelling   . Nocturia   . Obesity hypoventilation syndrome ([place]   . Obstructive sleep apnea 02/2018   Notably improved split-night study with weight loss from 270 (2017) down to 250 pounds (2019).  . Pancreatitis    takes Pancrelipase daily  .  [place]inson's disease [place]    takes Sinemet daily  . Peripheral neuropathy   . Pneumonia 2012  . Seizures [place]    takes Lamictal daily and Primidone nightly;last seizure 2wks ago  . Urinary urgency    With increased frequency  . Varicose veins of both lower extremities with pain    With edema.  Takes daily Lasix   Past Surgical History:  Procedure Laterality Date  . ABDOMINAL HYSTERECTOMY    . APPENDECTOMY    . BACK SURGERY    . CARDIAC CATHETERIZATION  2009/March 2012   2009: (Dr. KAlvie Heidelberg Nonobstructive CAD; 2012: Minimal CAD --> false positive stress test  . Cardiac Event Monitor  September-October 2017   Sinus rhythm with occasional PACs and artifact. No arrhythmias besides one short run of tachycardia.  . CHOLECYSTECTOMY    . COLONOSCOPY N/A 08/15/2012   Procedure: COLONOSCOPY;  Surgeon: WArta Silence MD;  Location: [facility];  Service: Endoscopy;  Laterality: N/A;  . COLONOSCOPY WITH PROPOFOL Left 06/17/2017   Procedure: COLONOSCOPY WITH PROPOFOL;  Surgeon: KRonnette Juniper MD;  [place]  Service: Gastroenterology;  Laterality: Left;  . CORONARY CALCIUM SCORE AND CTA  04/2018   Coronary calcium score 71.9.  Very large, hyperdynamic LAD wrapping apex giving rise to PDA.  Moderate LAD-diagonal and circumflex plaque -> CT FFR suggested positive findings in distal D1, D2 and distal circumflex.  Referred for cath. ==> FALSE POSITIVE  . ESOPHAGOGASTRODUODENOSCOPY    . eye cysts Bilateral   . LASIK    . LEFT HEART CATH AND CORONARY ANGIOGRAPHY N/A 05/16/2018   Procedure: LEFT HEART CATH AND CORONARY ANGIOGRAPHY;  Surgeon: SBelva Crome MD;  [facility]: [facility] INVASIVE CV LAB:  Angiographically normal coronary arteries with LVEDP of 21 mmHg.  Large draping hyperdominant LAD that wraps the apex and provides distal half of the PDA.  Relatively small caliber distal Cx -->proxLPDA, non-dom RCA. No Cx or Diag lesions -- FALSE + CT FFR  . NM MYOVIEW LTD  01/2016; 01/2018   a)LOW  RISK. No ischemia or infarction;; b) EF 55-60%. NO ST changes.  No ischemia or Infarction. NO significant RV enlargement.  LOW RISK.  .Marland KitchenRECTAL POLYPECTOMY    . TONSILLECTOMY    . TRANSTHORACIC ECHOCARDIOGRAM  01/2016; 05/2017   A) Normal LV chamber size with mod LVH pattern. EF 50-55%. Severe LA dilation. Mod RA dilation. PAP elevated at 38 mmHg;; B) Mild concentric LVH.  EF 55-60% & no RWMA.  Grade 2 DD.  Diastolic flattening of the ventricular septum == ? elevated PAP.  Mild aortic valve calcification/sclerosis.  Mod LA dilation.  Mild RV dilation & Mod RA dilation  . TRANSTHORACIC ECHOCARDIOGRAM  10/2017; 01/2018   a) EF 55-60%.  No RWMA,  Moderate LA dilation.  Mild LA dilation.  Peak PA pressures ~  57 mmHg (moderate);;; b) EF 55-60%.  GRII DD.  Suggestion of RV volume overload with moderate RV dilation.  Severe LA and RA dilation.Marland Kitchen  PA pressure ~67%.  . TUBAL LIGATION    . vaginal cyst removed     several times     Current Meds  Medication Sig  . acetaminophen (TYLENOL) 500 MG tablet Take 500 mg by mouth every 4 (four) hours as needed (for pain).   Marland Kitchen albuterol (PROVENTIL HFA;VENTOLIN HFA) 108 (90 Base) MCG/ACT inhaler Inhale 1-2 puffs into the lungs every 6 (six) hours as needed for wheezing or shortness of breath (DOE WHEEZING SOB).  Marland Kitchen atorvastatin (LIPITOR) 10 MG tablet TAKE 1 TABLET EVERY DAY  . B Complex-C (B-COMPLEX WITH VITAMIN C) tablet Take 1 tablet by mouth daily.  . Blood Glucose Monitoring Suppl (ACCU-CHEK NANO SMARTVIEW) W/DEVICE KIT Use to check blood sugar 3 times per day dx code E11.65  . carvedilol (COREG) 25 MG tablet Take 1 tablet by mouth twice daily  . Cholecalciferol (VITAMIN D-3) 1000 units CAPS Take 1,000 Units by mouth daily.  . diclofenac sodium (VOLTAREN) 1 % GEL Apply 2 g topically 4 (four) times daily as needed (pain).  . DROPLET PEN NEEDLES 31G X 8 MM MISC USE DAILY WITH VICTOZA AND/OR NOVOLOG.   . furosemide (LASIX) 40 MG tablet Take one tablet (45m) by mouth  on Saturday and Sunday only.  . furosemide (LASIX) 80 MG tablet Take one tablet (842m by mouth Monday through Friday only.  . hydrALAZINE (APRESOLINE) 50 MG tablet Take 2 tablet ( 100 mg) in morning and evening, and 50 mg at lunch time, may take an extra 100 mg if need B/P>150 at lunch  . HYDROcodone-acetaminophen (NORCO/VICODIN) 5-325 MG tablet Take 1 tablet by mouth every 6 (six) hours as needed for moderate pain.  . Insulin Glargine, 1 Unit Dial, (TOUJEO SOLOSTAR) 300 UNIT/ML SOPN Inject 36 Units into the skin at bedtime.  . isosorbide mononitrate (IMDUR) 60 MG 24 hr tablet Take 1 tablet (60 mg total) by mouth daily.  . Marland KitchenamoTRIgine (LAMICTAL) 100 MG tablet Take 1 tablet (100 mg total) by mouth 2 (two) times daily.  . Menthol, Topical Analgesic, 3.5 % GEL Apply 1 application topically as needed (for neuropathy in feet).  . metolazone (ZAROXOLYN) 2.5 MG tablet Take 1 tablet (2.5 mg total) by mouth See admin instructions. Take 1 tablet Monday and Friday only if weight is greater or equal to 269  . Multiple Vitamins-Minerals (MULTIVITAMIN WITH MINERALS) tablet Take 1 tablet by mouth daily.  . Marland KitchenZEMPIC, 1 MG/DOSE, 2 MG/1.5ML SOPN INJECT 1 MG INTO THE SKIN ONCE A WEEK  . Pancrelipase, Lip-Prot-Amyl, (ZENPEP) 25000-79000 units CPEP Take 1-2 capsules by mouth See admin instructions. Take 2 capsules with meals and 1 capsule with snacks  . potassium chloride SA (K-DUR) 20 MEQ tablet TAKE 2 TABLETS ONCE DAILY. BUT ON THE DAY YOU  TAKE METOLAZONE AN  EXTRA TABLET FOR A TOTAL OF 3 TABLETS (60MEQ)  . pregabalin (LYRICA) 100 MG capsule Take 1 capsule by mouth twice daily  . sertraline (ZOLOFT) 50 MG tablet TAKE 1 TABLET EVERY DAY     Allergies:   Other; Penicillins; Sulfa antibiotics; Aspirin; and Codeine   Social History   Tobacco Use  . Smoking status: Former Smoker    Packs/day: 0.50    Years: 25.00    Pack years: 12.50    Types: Cigarettes  . Smokeless tobacco: Never Used  . Tobacco comment: quit  smoking  20+ytrs ago  Substance Use Topics  . Alcohol use: No  . Drug use: No     Family Hx: The patient's family history includes Allergies in her father; Cancer in an other family member; Diabetes in her brother, mother, sister, and son; Heart disease in her father, mother, and sister; Heart failure in her father; Hyperlipidemia in her father, mother, and sister; Hypertension in her brother, father, mother, sister, sister, and son.  ROS:   Please see the history of present illness.    All other systems reviewed and are negative.   Prior CV studies:   The following studies were reviewed today:  Cardiac Catheterization 05/17/2019  Normal coronary arteries without evidence of obstructive disease.  Upper normal LV systolic function, EF 16%.  EDP 21 mmHg.  Findings compatible with diastolic heart failure.  Echocardiogram 02/11/2018 Left ventricle: The cavity size was normal. Systolic function was   normal. The estimated ejection fraction was in the range of 55%   to 60%. Wall motion was normal; there were no regional wall   motion abnormalities. Features are consistent with a pseudonormal   left ventricular filling pattern, with concomitant abnormal   relaxation and increased filling pressure (grade 2 diastolic   dysfunction). - Ventricular septum: The contour showed systolic flattening. These   changes are consistent with RV pressure overload. - Left atrium: The atrium was severely dilated. - Right ventricle: The cavity size was moderately dilated. Wall   thickness was normal. - Right atrium: The atrium was severely dilated. - Atrial septum: No defect or patent foramen ovale was identified. - Pulmonary arteries: Systolic pressure was moderately increased.   PA peak pressure: 67 mm Hg (S). Labs/Other Tests and Data Reviewed:    EKG:  No ECG reviewed.  Recent Labs: 05/15/2018: B Natriuretic Peptide 153.9 05/17/2018: Hemoglobin 11.0; Platelets 226 10/22/2018: ALT 13; BUN 17;  Creatinine, Ser 0.90; Potassium 4.1; Sodium 138   Recent Lipid Panel Lab Results  Component Value Date/Time   CHOL 145 10/22/2018 02:46 PM   TRIG 52 10/22/2018 02:46 PM   HDL 70 10/22/2018 02:46 PM   CHOLHDL 2.1 10/22/2018 02:46 PM   CHOLHDL 2.3 05/15/2018 06:00 AM   LDLCALC 65 10/22/2018 02:46 PM   LDLDIRECT 66.0 01/27/2015 11:04 AM    Wt Readings from Last 3 Encounters:  11/10/18 238 lb (108 kg)  10/22/18 239 lb 9.6 oz (108.7 kg)  10/22/18 239 lb 12.8 oz (108.8 kg)     Objective:    Vital Signs:  BP (!) 158/69   Pulse 62   Ht 5' 1"  (1.549 m)   Wt 238 lb (108 kg)   BMI 44.97 kg/m    VITAL SIGNS:  reviewed GEN:  no acute distress RESPIRATORY:  normal respiratory effort, symmetric expansion CARDIOVASCULAR:  lower extremity edema noted PSYCH:  normal affect  ASSESSMENT & PLAN:    1. Hypertension: According to home BP monitor records, she is running in the 150's and 160's in the am and PM. I will increase hydralazine in the noontime dose from 50 mg to 100 mg so that she is getting 100 mg TID each day. Follow up BMET in a month  2. Chronic Diastolic CHF: Continues on coreg, furosemide, but has some LEE from salty foods at times. I have advised her to take 60 mg of lasix on Sat and Sun, just for one week, and continue 40 mg on Sat and Sun thereafter, with normal 80 mg dose during the week. She is no longer  taking metolazone.   3. Hypercholesterolemia: Continue to take atorvastatin 80 mg daily as directed. She will need follow up labs on next appointment.   COVID-19 Education: The signs and symptoms of COVID-19 were discussed with the patient and how to seek care for testing (follow up with PCP or arrange E-visit).  The importance of social distancing was discussed today.  Time:   Today, I have spent 20 minutes with the patient with telehealth technology discussing the above problems.     Medication Adjustments/Labs and Tests Ordered: Current medicines are reviewed at  length with the patient today.  Concerns regarding medicines are outlined above.   Tests Ordered: BMET   Medication Changes: Increase hydralazine to 100 mg TID Take 60 mg of lasix on Sat and Sun  For one weekend and then go back normal dose of 40 mg on Sat and Sun.  Disposition:  Follow up 3 months   Signed, Phill Myron. [place] Pugh, ANP, [facility]  11/10/2018 10:34 AM    [facility]

## 2018-11-10 ENCOUNTER — Encounter: Payer: Self-pay | Admitting: Adult Health

## 2018-11-10 ENCOUNTER — Telehealth (INDEPENDENT_AMBULATORY_CARE_PROVIDER_SITE_OTHER): Payer: Medicare HMO | Admitting: Adult Health

## 2018-11-10 VITALS — BP 158/69 | HR 62 | Ht 61.0 in | Wt 238.0 lb

## 2018-11-10 DIAGNOSIS — I2510 Atherosclerotic heart disease of native coronary artery without angina pectoris: Secondary | ICD-10-CM | POA: Diagnosis not present

## 2018-11-10 DIAGNOSIS — N182 Chronic kidney disease, stage 2 (mild): Secondary | ICD-10-CM

## 2018-11-10 DIAGNOSIS — R609 Edema, unspecified: Secondary | ICD-10-CM

## 2018-11-10 DIAGNOSIS — R600 Localized edema: Secondary | ICD-10-CM

## 2018-11-10 DIAGNOSIS — I5032 Chronic diastolic (congestive) heart failure: Secondary | ICD-10-CM

## 2018-11-10 DIAGNOSIS — E1122 Type 2 diabetes mellitus with diabetic chronic kidney disease: Secondary | ICD-10-CM

## 2018-11-10 DIAGNOSIS — G4733 Obstructive sleep apnea (adult) (pediatric): Secondary | ICD-10-CM

## 2018-11-10 DIAGNOSIS — I110 Hypertensive heart disease with heart failure: Secondary | ICD-10-CM

## 2018-11-10 DIAGNOSIS — E785 Hyperlipidemia, unspecified: Secondary | ICD-10-CM

## 2018-11-10 MED ORDER — HYDRALAZINE HCL 100 MG PO TABS: 100.0000 mg | ORAL_TABLET | Freq: Three times a day (TID) | ORAL | 3 refills | Status: DC

## 2018-11-10 NOTE — Patient Instructions (Signed)
Medication Instructions:   Increase Hydralazine to 100 mg (two 50 mg tablets until finished, then one 100 mg tablet--new Rx) by mouth 3 times daily.  OK to take Lasix 40 mg--this weekend ONLY, increase to 60 mg (Saturday 5/23 and Sunday 5/24). Go back to 40 mg daily on Saturdays and Sundays thereafter.  If you need a refill on your cardiac medications before your next appointment, please call your pharmacy.   Lab work: Your physician recommends that you return for lab work in: 3 months--BMET  If you have labs (blood work) drawn today and your tests are completely normal, you will receive your results only by: Marland Kitchen MyChart Message (if you have MyChart) OR . A paper copy in the mail If you have any lab test that is abnormal or we need to change your treatment, we will call you to review the results.  Follow-Up: At [facility], you and your health needs are our priority.  As part of our continuing mission to provide you with exceptional heart care, we have created designated Provider Care Teams.  These Care Teams include your primary Cardiologist (physician) and Advanced Practice Providers (APPs -  Physician Assistants and Nurse Practitioners) who all work together to provide you with the care you need, when you need it. You will need a follow up appointment in 3 months.  Please call our office 2 months in advance to schedule this appointment.  You may see Glenetta Hew, MD or one of the following Advanced Practice Providers on your designated Care Team:   Rosaria Ferries, PA-C . Jory Sims, DNP, ANP  Any Other Special Instructions Will Be Listed Below (If Applicable). None

## 2018-11-11 ENCOUNTER — Telehealth: Payer: Self-pay

## 2018-11-11 NOTE — Telephone Encounter (Signed)
Unable to get in contact with the patient to convert their office visit with [facility] on 11/18/2018 into a doxy.me visit. I left a voicemail asking the patient to return my call. Office number was provided.   If patient calls back please convert their office visit into a doxy.me visit.

## 2018-11-13 NOTE — Telephone Encounter (Signed)
LMVM for pt to return call for converting to Doxy.me VV Due to current COVID 19 pandemic, our office is severely reducing in office visits until further notice, in order to minimize the risk to our patients and healthcare providers.

## 2018-11-14 NOTE — Telephone Encounter (Signed)
11-14-18 Pt and daughter in law Stanton Kidney called, pt gave verbal consent to file insurance for doxy.me vv Email confirmed as:[email or web address]  Pt understands that although there may be some limitations with this type of visit, we will take all precautions to reduce any security or privacy concerns.  Pt understands that this will be treated like an in office visit and we will file with pt's insurance, and there may be a patient responsible charge related to this service. *email sent*

## 2018-11-17 ENCOUNTER — Encounter: Payer: Self-pay | Admitting: Neurology

## 2018-11-18 ENCOUNTER — Encounter: Payer: Self-pay | Admitting: Adult Health

## 2018-11-18 ENCOUNTER — Ambulatory Visit (INDEPENDENT_AMBULATORY_CARE_PROVIDER_SITE_OTHER): Payer: Medicare HMO | Admitting: Adult Health

## 2018-11-18 ENCOUNTER — Other Ambulatory Visit: Payer: Self-pay

## 2018-11-18 DIAGNOSIS — Z9989 Dependence on other enabling machines and devices: Secondary | ICD-10-CM | POA: Diagnosis not present

## 2018-11-18 DIAGNOSIS — G4733 Obstructive sleep apnea (adult) (pediatric): Secondary | ICD-10-CM | POA: Diagnosis not present

## 2018-11-18 NOTE — Progress Notes (Signed)
  [facility] [address] [place]. [place] [address] [phone]     Virtual Visit via Telephone Note  I connected with [name] on 11/18/18 at  9:30 AM EDT by telephone located remotely at [facility] and verified that I am speaking with the correct person using two identifiers who reports being located her home.    Visit scheduled by Ward Givens NP. I discussed the limitations, risks, security and privacy concerns of performing an evaluation and management service by telephone and the availability of in person appointments. I also discussed with the patient that there may be a patient responsible charge related to this service. The patient expressed understanding and agreed to proceed. See telephone note for consent and additional scheduling information.    History of Present Illness:  HAYLA HINGER is a 73 y.o. female who has been followed in this office for OSA on cpap. Shewas initially scheduled for face-to-face office follow up visit today time but due to [place], visit rescheduled for non-face-to-face telephone visit with patients consent. Unable to participate in video visit due to lack of access to device with camera.    Ms. Azzie Roup is a 73 year old female with a history of obstructive sleep apnea on CPAP.  She was scheduled for virtual visit however we had to convert to a telephone visit as her computer kept freezing up.  Her CPAP download indicates that she use her machine nightly for compliance of 100%.  She use her machine greater than 4 hours 27 out of 30 days for compliance of 90%.  On average she uses her machine 5 hours and 32 minutes.  Her residual AHI 0.9 on 6 to 11 cm of water with EPR 3.  She does not have a significant leak.  She continues to find the CPAP beneficial.  Denies any new issues.   Observations/Objective:   Neurological examination  Mentation: Alert oriented to time, place, history taking. speech and  language fluent  Assessment and Plan:  1. OSA on CPAP  The patient's download shows excellent compliance and good treatment of her apnea.  She is encouraged continues in the CPAP nightly and greater than 4 hours each night.  She is advised that if her symptoms worsen or she develops new symptoms she should let us know.  She will follow-up in 6 months or sooner if needed.  Follow Up Instructions:     F/U 6 months  I discussed the assessment and treatment plan with the patient.  The patient was provided an opportunity to ask questions and all were answered to their satisfaction. The patient agreed with the plan and verbalized an understanding of the instructions.   I provided 15 minutes of non-face-to-face time during this encounter.    Ward Givens, NP   [facility] [address] [place], [place] [address]  Phone [phone] Fax [phone] Note: This document was prepared with digital dictation and possible smart phrase technology. Any transcriptional errors that result from this process are unintentional.

## 2018-11-20 ENCOUNTER — Other Ambulatory Visit: Payer: Self-pay | Admitting: *Deleted

## 2018-11-20 ENCOUNTER — Other Ambulatory Visit: Payer: Self-pay | Admitting: Internal Medicine

## 2018-11-20 NOTE — Patient Outreach (Signed)
[place] [facility]) Care Management  11/20/2018  [name] 1946-04-21 [id number]   RN Health Coach attempted follow up outreach call to patient.  Patient was unavailable. HIPPA compliance voicemail message left with return callback number.  Plan: RN will call patient again within 30 days.  [place] Care Management [phone]

## 2018-11-24 DIAGNOSIS — M654 Radial styloid tenosynovitis [de Quervain]: Secondary | ICD-10-CM | POA: Diagnosis not present

## 2018-11-24 DIAGNOSIS — M25531 Pain in right wrist: Secondary | ICD-10-CM | POA: Diagnosis not present

## 2018-11-24 DIAGNOSIS — M1811 Unilateral primary osteoarthritis of first carpometacarpal joint, right hand: Secondary | ICD-10-CM | POA: Diagnosis not present

## 2018-11-27 ENCOUNTER — Telehealth: Payer: Self-pay

## 2018-11-27 NOTE — Telephone Encounter (Signed)
I left the pt a message that Dr. Baird Cancer wanted to know if the pt had a steroid injection recently because the pt said that her sugars yesterday morning was 171 and this am is 245.  Dr. Baird Cancer said if yes for the pt to increase her insulin to 35 units and to let the pt know that the steroid injection is why her sugars are elevated.

## 2018-11-28 DIAGNOSIS — E1122 Type 2 diabetes mellitus with diabetic chronic kidney disease: Secondary | ICD-10-CM | POA: Diagnosis not present

## 2018-11-28 DIAGNOSIS — I110 Hypertensive heart disease with heart failure: Secondary | ICD-10-CM | POA: Diagnosis not present

## 2018-11-28 DIAGNOSIS — R609 Edema, unspecified: Secondary | ICD-10-CM | POA: Diagnosis not present

## 2018-11-28 DIAGNOSIS — I2510 Atherosclerotic heart disease of native coronary artery without angina pectoris: Secondary | ICD-10-CM | POA: Diagnosis not present

## 2018-11-28 DIAGNOSIS — I5032 Chronic diastolic (congestive) heart failure: Secondary | ICD-10-CM | POA: Diagnosis not present

## 2018-11-28 DIAGNOSIS — N182 Chronic kidney disease, stage 2 (mild): Secondary | ICD-10-CM | POA: Diagnosis not present

## 2018-11-28 DIAGNOSIS — G4733 Obstructive sleep apnea (adult) (pediatric): Secondary | ICD-10-CM | POA: Diagnosis not present

## 2018-11-28 DIAGNOSIS — Z794 Long term (current) use of insulin: Secondary | ICD-10-CM | POA: Diagnosis not present

## 2018-11-28 DIAGNOSIS — E785 Hyperlipidemia, unspecified: Secondary | ICD-10-CM | POA: Diagnosis not present

## 2018-11-29 LAB — BASIC METABOLIC PANEL
BUN/Creatinine Ratio: 20 (ref 12–28)
BUN: 19 mg/dL (ref 8–27)
CO2: 26 mmol/L (ref 20–29)
Calcium: 9.4 mg/dL (ref 8.7–10.3)
Chloride: 96 mmol/L (ref 96–106)
Creatinine, Ser: 0.95 mg/dL (ref 0.57–1.00)
GFR calc Af Amer: 70 mL/min/{1.73_m2} (ref >59)
GFR calc non Af Amer: 61 mL/min/{1.73_m2} (ref >59)
Glucose: 265 mg/dL — ABNORMAL HIGH (ref 65–99)
Potassium: 3.9 mmol/L (ref 3.5–5.2)
Sodium: 135 mmol/L (ref 134–144)

## 2018-12-03 DIAGNOSIS — G4733 Obstructive sleep apnea (adult) (pediatric): Secondary | ICD-10-CM | POA: Diagnosis not present

## 2018-12-11 ENCOUNTER — Telehealth: Payer: Self-pay | Admitting: Adult Health

## 2018-12-11 NOTE — Telephone Encounter (Signed)
Called and spoke with patient.  Patient stated she is doing great with her cpap.  Patient was offered OV, patient denied.  Patient stated her last OV, she came to the office and was told to go home, because her OV was by phone.  Patient stated no one called her.  Per epic, Patient was no show for televisit 10/20/18.  Patient stated she would call if needed.  Nothing further at this time.

## 2018-12-11 NOTE — Telephone Encounter (Signed)
Pt is calling back [phone]

## 2018-12-11 NOTE — Telephone Encounter (Signed)
ATC pt, no answer. Left message for pt to call back.  

## 2018-12-17 ENCOUNTER — Telehealth: Payer: Self-pay | Admitting: Adult Health

## 2018-12-17 DIAGNOSIS — G4733 Obstructive sleep apnea (adult) (pediatric): Secondary | ICD-10-CM | POA: Diagnosis not present

## 2018-12-17 NOTE — Telephone Encounter (Signed)
Attempted to call pt x2 but each time line rang busy. Unable to reach pt and unable to leave VM.

## 2018-12-18 NOTE — Telephone Encounter (Signed)
Attempted to contact via home number line rang busy x2. Attempted to contact via mobile number, voicemail has not been set up yet. Will attempt to contact at next business day. Per cahrt there is no tele[hone encounter. Patient has not had any recent imaging or labs done.

## 2018-12-19 NOTE — Telephone Encounter (Signed)
Attempted to call pt but unable to reach. Left message for pt to return call. Due to multiple attempts trying to reach pt and unable to do so, per triage protocol, will close encounter.

## 2018-12-22 ENCOUNTER — Telehealth: Payer: Self-pay

## 2018-12-22 DIAGNOSIS — M654 Radial styloid tenosynovitis [de Quervain]: Secondary | ICD-10-CM | POA: Diagnosis not present

## 2018-12-22 DIAGNOSIS — M25531 Pain in right wrist: Secondary | ICD-10-CM | POA: Diagnosis not present

## 2018-12-22 DIAGNOSIS — M1811 Unilateral primary osteoarthritis of first carpometacarpal joint, right hand: Secondary | ICD-10-CM | POA: Diagnosis not present

## 2018-12-22 NOTE — Telephone Encounter (Signed)
Left the pt a message to call back The pt left a message that her blood sugars for last week were 22nd noon 150, pm 144, 23rd 165 am, noon 239, pm 180, 24th 218 am, noon 145, pm 140, 25th 205 am, 140 noon, 235 pm, and then bedtime 197, 26th am 127, noon 118, 135 pm. 27th 245, noon 117, dinner 91, 28th 157 am , 143 noon, 121 pm, 29th am 135.  I was calling the pt back to let her know that Dr. Baird Cancer wants to know if she is ok with a sliding scale insulin.

## 2018-12-23 ENCOUNTER — Telehealth: Payer: Self-pay

## 2018-12-23 ENCOUNTER — Encounter: Payer: Self-pay | Admitting: Internal Medicine

## 2018-12-23 ENCOUNTER — Ambulatory Visit (INDEPENDENT_AMBULATORY_CARE_PROVIDER_SITE_OTHER): Payer: Medicare HMO | Admitting: Internal Medicine

## 2018-12-23 ENCOUNTER — Other Ambulatory Visit: Payer: Self-pay

## 2018-12-23 ENCOUNTER — Other Ambulatory Visit: Payer: Self-pay | Admitting: *Deleted

## 2018-12-23 VITALS — BP 148/76 | HR 70 | Temp 98.3°F | Ht 61.0 in | Wt 233.6 lb

## 2018-12-23 DIAGNOSIS — N182 Chronic kidney disease, stage 2 (mild): Secondary | ICD-10-CM | POA: Diagnosis not present

## 2018-12-23 DIAGNOSIS — I129 Hypertensive chronic kidney disease with stage 1 through stage 4 chronic kidney disease, or unspecified chronic kidney disease: Secondary | ICD-10-CM

## 2018-12-23 DIAGNOSIS — K458 Other specified abdominal hernia without obstruction or gangrene: Secondary | ICD-10-CM

## 2018-12-23 DIAGNOSIS — Z794 Long term (current) use of insulin: Secondary | ICD-10-CM

## 2018-12-23 DIAGNOSIS — E1122 Type 2 diabetes mellitus with diabetic chronic kidney disease: Secondary | ICD-10-CM | POA: Diagnosis not present

## 2018-12-23 NOTE — Patient Outreach (Signed)
[place] [facility]) Care Management  12/23/2018  [name] [birth year] [id number]   RN Health Coach attempted follow up outreach call to patient.  Patient was unavailable. HIPPA compliance voicemail message left with return callback number.  Plan: RN will call patient again within 30 days.  [place] Care Management [phone]

## 2018-12-23 NOTE — Patient Instructions (Signed)
PLEASE FOLLOW DIRECTIONS FOR SLIDING SCALE.   INCREASE TOUJEO TO 34 UNITS NIGHTLY  I WILL SCHEDULE CT SCAN ABDOMEN FOR YOUR BELLY

## 2018-12-23 NOTE — Telephone Encounter (Signed)
The pt said yes to starting a sliding scale insulin.  The pt was asked during her office visit.

## 2018-12-28 ENCOUNTER — Other Ambulatory Visit: Payer: Self-pay | Admitting: Cardiology

## 2018-12-28 ENCOUNTER — Other Ambulatory Visit: Payer: Self-pay | Admitting: Neurology

## 2018-12-28 DIAGNOSIS — G40009 Localization-related (focal) (partial) idiopathic epilepsy and epileptic syndromes with seizures of localized onset, not intractable, without status epilepticus: Secondary | ICD-10-CM

## 2018-12-29 ENCOUNTER — Other Ambulatory Visit: Payer: Self-pay | Admitting: *Deleted

## 2018-12-29 NOTE — Telephone Encounter (Signed)
Rx(s) sent to pharmacy electronically.  

## 2018-12-29 NOTE — Patient Outreach (Signed)
[place] [facility]) Care Management  12/29/2018  [name] 1945-10-29 [id number]  RN Health Coach attempted follow up outreach call to patient.  Patient was unavailable. HIPPA compliance voicemail message left with return callback number.  Plan: RN will call patient again within 30 days.  [place] Care Management [phone]

## 2018-12-30 NOTE — Patient Outreach (Addendum)
[facility]) Care Management  12/30/2018   [name] Feb 03, 1946 [id number]  [place] received return telephone call from patient.  [place] compliance verified. Per patient she is doing good. Patient stated that her right arm has been hurting and she went to the Dr. She is wearing a brace on it at this time and got a shot in it. Patient fasting blood sugar is 171. Per patient she is trying to eat healthy. Patient is weighing daily. Patient has a goal of loosing from 233 to 165. Patient is now doing a routine exercise program of walking 10- 12 laps a day . She weighs daily. She is wearing her CPAP from 4-7 hours at night. Patient is using pandemic safety precautions when she goes out. Patient has been trying to take her medication as per order. Patient stated she forgot her lasix one day and her weight increased but she remembered to take it the next day. Patient has agreed to follow up outreach calls.   Current Medications:  Current Outpatient Medications  Medication Sig Dispense Refill  . acetaminophen (TYLENOL) 500 MG tablet Take 500 mg by mouth every 4 (four) hours as needed (for pain).     Marland Kitchen albuterol (PROVENTIL HFA;VENTOLIN HFA) 108 (90 Base) MCG/ACT inhaler Inhale 1-2 puffs into the lungs every 6 (six) hours as needed for wheezing or shortness of breath (DOE WHEEZING SOB). 1 Inhaler 1  . atorvastatin (LIPITOR) 10 MG tablet TAKE 1 TABLET EVERY DAY 90 tablet 1  . B Complex-C (B-COMPLEX WITH VITAMIN C) tablet Take 1 tablet by mouth daily.    . Blood Glucose Monitoring Suppl (ACCU-CHEK NANO SMARTVIEW) W/DEVICE KIT Use to check blood sugar 3 times per day dx code E11.65 1 kit 0  . carvedilol (COREG) 25 MG tablet Take 1 tablet (25 mg total) by mouth 2 (two) times daily with a meal. 180 tablet 3  . Cholecalciferol (VITAMIN D-3) 1000 units CAPS Take 1,000 Units by mouth daily.    . diclofenac sodium (VOLTAREN) 1 % GEL Apply 2 g topically 4 (four) times daily as needed  (pain).    . DROPLET PEN NEEDLES 31G X 8 MM MISC USE DAILY WITH VICTOZA AND/OR NOVOLOG.  400 each 1  . furosemide (LASIX) 40 MG tablet Take one tablet (73m) by mouth on Saturday and Sunday only. 180 tablet 3  . furosemide (LASIX) 80 MG tablet Take one tablet (897m by mouth Monday through Friday only. 90 tablet 3  . hydrALAZINE (APRESOLINE) 100 MG tablet Take 1 tablet (100 mg total) by mouth 3 (three) times daily. 90 tablet 3  . HYDROcodone-acetaminophen (NORCO/VICODIN) 5-325 MG tablet Take 1 tablet by mouth every 6 (six) hours as needed for moderate pain. 30 tablet 0  . Insulin Glargine, 1 Unit Dial, (TOUJEO SOLOSTAR) 300 UNIT/ML SOPN Inject 36 Units into the skin at bedtime. (Patient taking differently: Inject 32 Units into the skin at bedtime. ) 15 pen 2  . isosorbide mononitrate (IMDUR) 60 MG 24 hr tablet Take 1 tablet (60 mg total) by mouth daily. 90 tablet 3  . lamoTRIgine (LAMICTAL) 100 MG tablet Take 1 tablet by mouth twice daily 180 tablet 0  . Menthol, Topical Analgesic, 3.5 % GEL Apply 1 application topically as needed (for neuropathy in feet). 120 mL 0  . metolazone (ZAROXOLYN) 2.5 MG tablet Take 1 tablet (2.5 mg total) by mouth See admin instructions. Take 1 tablet Monday and Friday only if weight is greater or equal to 269  10 tablet 0  . Multiple Vitamins-Minerals (MULTIVITAMIN WITH MINERALS) tablet Take 1 tablet by mouth daily.    Marland Kitchen OZEMPIC, 1 MG/DOSE, 2 MG/1.5ML SOPN INJECT 1 MG INTO THE SKIN ONCE A WEEK 12 mL 0  . Pancrelipase, Lip-Prot-Amyl, (ZENPEP) 25000-79000 units CPEP Take 1-2 capsules by mouth See admin instructions. Take 2 capsules with meals and 1 capsule with snacks    . potassium chloride SA (K-DUR) 20 MEQ tablet TAKE 2 TABLETS ONCE DAILY. BUT ON THE DAY YOU  TAKE METOLAZONE AN  EXTRA TABLET FOR A TOTAL OF 3 TABLETS (60MEQ) 204 tablet 1  . pregabalin (LYRICA) 100 MG capsule Take 1 capsule by mouth twice daily (Patient not taking: Reported on 12/23/2018) 180 capsule 1  .  sertraline (ZOLOFT) 50 MG tablet TAKE 1 TABLET EVERY DAY 90 tablet 2   No current facility-administered medications for this visit.     Functional Status:  In your present state of health, do you have any difficulty performing the following activities: 10/22/2018 08/22/2018  Hearing? N N  Vision? Y N  Difficulty concentrating or making decisions? Y N  Comment some forgetfulness -  Walking or climbing stairs? Y Y  Comment - Patient has pain in knee  Dressing or bathing? Y N  Comment due to arm pain -  Doing errands, shopping? [name]  Comment sister with at appointments Sister takes patient for visits  Preparing Food and eating ? N Y  Comment - sister helps prepare food  Using the Toilet? N N  In the past six months, have you accidently leaked urine? [name]  Comment wears a pad some stress incont  Do you have problems with loss of bowel control? N N  Managing your Medications? [name]  Comment sister sometimes sets up sister helps  Managing your Finances? Y Y  Comment sister does sister takes care of finances  Housekeeping or managing your Housekeeping? N Y  Comment - lives with sister  Some recent data might be hidden    Fall/Depression Screening: Fall Risk  12/29/2018 12/23/2018 10/22/2018  Falls in the past year? 0 0 0  Number falls in past yr: - - -  Injury with Fall? - - -  Risk Factor Category  - - -  Risk for fall due to : - - Medication side effect;Impaired balance/gait  Follow up - - Education provided;Falls prevention discussed   PHQ 2/9 Scores 12/29/2018 12/23/2018 10/22/2018 10/22/2018 09/02/2018 08/22/2018 07/04/2018  PHQ - 2 Score 0 0 0 0 0 0 0  PHQ- 9 Score - - 3 - - - -   THN CM Care Plan Problem One     Most Recent Value  Care Plan Problem One  Knowledge deficit in self management of CHF  Role Documenting the Problem One  [place] for Problem One  Active  THN Long Term Goal   Patient will not have any readmissions within the next 90 days  THN Long Term Goal  Start Date  12/30/18  Interventions for Problem One Long Term Goal  RN reiterated medication adherence. Patient has forgotten to taken her lasix sometimess.  THN CM Short Term Goal #1   Patient will adhere more to diet on reduce sodium intake within the next 30 days  THN CM Short Term Goal #1 Start Date  12/29/18  Interventions for Short Term Goal #1  Rn reiterated that patient needs to watch her sodium intake and portion control. Family is being supportative  and trying to help. RN will follow up with discussion each outreach stressing the importance.  THN CM Short Term Goal #2   Patient will follow up with pulmonologist for sleep apnea results and getting a CPAP within the next 30 days  THN CM Short Term Goal #2 Met Date  12/29/18  [facility] CM Short Term Goal #3  Patient will verbalize following up making health maintenance appointments within the next 30 days  THN CM Short Term Goal #3 Start Date  12/29/18  Interventions for Short Tern Goal #3  RN reiterated rescheduling appointments due to the virus. RN will follow up for compliance.       Assessment:  Patient is weighing daily Patient is trying to eat healthier Patient is trying to use portion control Patient fasting blood sugar is 171 Patient is using CPAP 4-7 hrs a night Patient is taking medications but does forget sometimes if distracted Plan:  RN discussed health maintenance is important to follow up on RN sent [place] RN sent [place] Food labels RN discussed portion control RN sent My Plate from [employer] RN discussed A1C and fasting blood sugar RN discussed portion control RN will follow up within the month of September RN sent update assessment to PCP  [facility] [phone]

## 2019-01-02 DIAGNOSIS — G4733 Obstructive sleep apnea (adult) (pediatric): Secondary | ICD-10-CM | POA: Diagnosis not present

## 2019-01-04 NOTE — Progress Notes (Signed)
Subjective:     Patient ID: [name] , female    DOB: 1946/04/28 , 73 y.o.   MRN: [id number]   Chief Complaint  Patient presents with  . knot above naval    HPI  She is here today for further evaluation of  "knot" above her navel. She reports she first noticed this about a month ago. However, now it has become more prominent. Additionally, there is now some associated abdominal discomfort. She reports having regular bowel movements. She denies n/v.     Past Medical History:  Diagnosis Date  . Abnormal liver function     in the past.  . Anemia   . Arthritis    all over  . Bruises easily   . Cataract    right eye;immature  . Chronic back pain    stenosis  . Chronic cough   . Chronic kidney disease   . COPD (chronic obstructive pulmonary disease) ([place])   . Demand myocardial infarction [facility]) 2012   Demand Infarction in setting of Pancreatitis --> mild Troponin elevation, NON-OBSTRUCTIVE CAD  . Depression    takes Abilify daily as well as Zoloft  . Diastolic heart failure 1438   Grade 1 diastolic Dysfunction by Echo   . Diverticulosis   . DM (diabetes mellitus) ([place])    takes Victoza daily as well as Lantus and Humalog  . Empty sella ([place])    on MRI in 2009.  . Glaucoma   . Headache(784.0)    last migraine-4-65yr ago  . History of blood transfusion    no abnormal reaction noted  . History of colon polyps    benign  . HTN (hypertension)    takes BKinder Morgan Energydaily  . Hyperlipidemia    takes Lipitor daily  . Joint swelling   . Nocturia   . Obesity hypoventilation syndrome ([place]   . Obstructive sleep apnea 02/2018   Notably improved split-night study with weight loss from 270 (2017) down to 250 pounds (2019).  . Pancreatitis    takes Pancrelipase daily  . Parkinson's disease ([place]    takes Sinemet daily  . Peripheral neuropathy   . Pneumonia 2012  . Seizures ([place]    takes Lamictal daily and Primidone nightly;last seizure 2wks ago  .  Urinary urgency    With increased frequency  . Varicose veins of both lower extremities with pain    With edema.  Takes daily Lasix     Family History  Problem Relation Age of Onset  . Allergies Father   . Heart disease Father        before 657 . Heart failure Father   . Hypertension Father   . Hyperlipidemia Father   . Heart disease Mother   . Diabetes Mother   . Hypertension Mother   . Hyperlipidemia Mother   . Hypertension Sister   . Heart disease Sister        before 661 . Hyperlipidemia Sister   . Diabetes Brother   . Hypertension Brother   . Diabetes Sister   . Hypertension Sister   . Diabetes Son   . Hypertension Son   . Cancer Other      Current Outpatient Medications:  .  acetaminophen (TYLENOL) 500 MG tablet, Take 500 mg by mouth every 4 (four) hours as needed (for pain). , Disp: , Rfl:  .  albuterol (PROVENTIL HFA;VENTOLIN HFA) 108 (90 Base) MCG/ACT inhaler, Inhale 1-2 puffs into the lungs every 6 (six) hours as  needed for wheezing or shortness of breath (DOE WHEEZING SOB)., Disp: 1 Inhaler, Rfl: 1 .  atorvastatin (LIPITOR) 10 MG tablet, TAKE 1 TABLET EVERY DAY, Disp: 90 tablet, Rfl: 1 .  B Complex-C (B-COMPLEX WITH VITAMIN C) tablet, Take 1 tablet by mouth daily., Disp: , Rfl:  .  Blood Glucose Monitoring Suppl (ACCU-CHEK NANO SMARTVIEW) W/DEVICE KIT, Use to check blood sugar 3 times per day dx code E11.65, Disp: 1 kit, Rfl: 0 .  Cholecalciferol (VITAMIN D-3) 1000 units CAPS, Take 1,000 Units by mouth daily., Disp: , Rfl:  .  diclofenac sodium (VOLTAREN) 1 % GEL, Apply 2 g topically 4 (four) times daily as needed (pain)., Disp: , Rfl:  .  DROPLET PEN NEEDLES 31G X 8 MM MISC, USE DAILY WITH VICTOZA AND/OR NOVOLOG. , Disp: 400 each, Rfl: 1 .  furosemide (LASIX) 40 MG tablet, Take one tablet (68m) by mouth on Saturday and Sunday only., Disp: 180 tablet, Rfl: 3 .  furosemide (LASIX) 80 MG tablet, Take one tablet (814m by mouth Monday through Friday only., Disp: 90  tablet, Rfl: 3 .  hydrALAZINE (APRESOLINE) 100 MG tablet, Take 1 tablet (100 mg total) by mouth 3 (three) times daily., Disp: 90 tablet, Rfl: 3 .  Insulin Glargine, 1 Unit Dial, (TOUJEO SOLOSTAR) 300 UNIT/ML SOPN, Inject 36 Units into the skin at bedtime. (Patient taking differently: Inject 32 Units into the skin at bedtime. ), Disp: 15 pen, Rfl: 2 .  isosorbide mononitrate (IMDUR) 60 MG 24 hr tablet, Take 1 tablet (60 mg total) by mouth daily., Disp: 90 tablet, Rfl: 3 .  Menthol, Topical Analgesic, 3.5 % GEL, Apply 1 application topically as needed (for neuropathy in feet)., Disp: 120 mL, Rfl: 0 .  metolazone (ZAROXOLYN) 2.5 MG tablet, Take 1 tablet (2.5 mg total) by mouth See admin instructions. Take 1 tablet Monday and Friday only if weight is greater or equal to 269, Disp: 10 tablet, Rfl: 0 .  Multiple Vitamins-Minerals (MULTIVITAMIN WITH MINERALS) tablet, Take 1 tablet by mouth daily., Disp: , Rfl:  .  OZEMPIC, 1 MG/DOSE, 2 MG/1.5ML SOPN, INJECT 1 MG INTO THE SKIN ONCE A WEEK, Disp: 12 mL, Rfl: 0 .  Pancrelipase, Lip-Prot-Amyl, (ZENPEP) 25000-79000 units CPEP, Take 1-2 capsules by mouth See admin instructions. Take 2 capsules with meals and 1 capsule with snacks, Disp: , Rfl:  .  potassium chloride SA (K-DUR) 20 MEQ tablet, TAKE 2 TABLETS ONCE DAILY. BUT ON THE DAY YOU  TAKE METOLAZONE AN  EXTRA TABLET FOR A TOTAL OF 3 TABLETS (60MEQ), Disp: 204 tablet, Rfl: 1 .  sertraline (ZOLOFT) 50 MG tablet, TAKE 1 TABLET EVERY DAY, Disp: 90 tablet, Rfl: 2 .  carvedilol (COREG) 25 MG tablet, Take 1 tablet (25 mg total) by mouth 2 (two) times daily with a meal., Disp: 180 tablet, Rfl: 3 .  HYDROcodone-acetaminophen (NORCO/VICODIN) 5-325 MG tablet, Take 1 tablet by mouth every 6 (six) hours as needed for moderate pain., Disp: 30 tablet, Rfl: 0 .  lamoTRIgine (LAMICTAL) 100 MG tablet, Take 1 tablet by mouth twice daily, Disp: 180 tablet, Rfl: 0 .  pregabalin (LYRICA) 100 MG capsule, Take 1 capsule by mouth twice  daily (Patient not taking: Reported on 12/23/2018), Disp: 180 capsule, Rfl: 1   Allergies  Allergen Reactions  . Other Anaphylaxis and Rash    Bleach  . Penicillins Hives    Has patient had a PCN reaction causing immediate rash, facial/tongue/throat swelling, SOB or lightheadedness with hypotension: Yes Has patient had  a PCN reaction causing severe rash involving mucus membranes or skin necrosis: No Has patient had a PCN reaction that required hospitalization: No Has patient had a PCN reaction occurring within the last 10 years: No If all of the above answers are "NO", then may proceed with Cephalosporin use.   . Sulfa Antibiotics Hives  . Aspirin Other (See Comments)     On aspirin 81 mg - Rectal bleeding in dec 2018  . Codeine     Headache and makes the patient feel "off"     Review of Systems  Constitutional: Negative.   Respiratory: Negative.   Cardiovascular: Negative.   Gastrointestinal: Positive for abdominal pain.  Neurological: Negative.   Psychiatric/Behavioral: Negative.      Today's Vitals   12/23/18 0900  BP: (!) 148/76  Pulse: 70  Temp: 98.3 F (36.8 C)  TempSrc: Oral  Weight: 233 lb 9.6 oz (106 kg)  Height: 5' 1"  (1.549 m)  PainSc: 0-No pain   Body mass index is 44.14 kg/m.   Objective:  Physical Exam Vitals signs and nursing note reviewed.  Constitutional:      Appearance: Normal appearance. She is obese.  HENT:     Head: Normocephalic and atraumatic.  Cardiovascular:     Rate and Rhythm: Normal rate and regular rhythm.     Heart sounds: Normal heart sounds.  Pulmonary:     Effort: Pulmonary effort is normal.     Breath sounds: Normal breath sounds.  Abdominal:     Hernia: A hernia is present.     Comments: Obese, difficult to assess organomegaly. Bulging above umbilicus  Skin:    General: Skin is warm.  Neurological:     General: No focal deficit present.     Mental Status: She is alert.  Psychiatric:        Mood and Affect: Mood  normal.        Behavior: Behavior normal.         Assessment And Plan:     1. Other specified abdominal hernia without obstruction or gangrene  She agrees to CT scan to further evaluation this periumbilical hernia.   - CT ABDOMEN PELVIS WO CONTRAST; Future  2. Type 2 diabetes mellitus with stage 2 chronic kidney disease, with long-term current use of insulin (HCC)  Importance of dietary compliance was discussed with the patient.   [name], MD    THE PATIENT IS ENCOURAGED TO PRACTICE SOCIAL DISTANCING DUE TO THE COVID-19 PANDEMIC.

## 2019-01-05 ENCOUNTER — Other Ambulatory Visit: Payer: Self-pay | Admitting: Internal Medicine

## 2019-01-16 ENCOUNTER — Telehealth: Payer: Self-pay | Admitting: *Deleted

## 2019-01-16 NOTE — Telephone Encounter (Signed)
    COVID-19 Pre-Screening Questions:  . In the past 7 to 10 days have you had a cough,  shortness of breath, headache, congestion, fever (100 or greater) body aches, chills, sore throat, or sudden loss of taste or sense of smell?NO  . Have you been around anyone with known Covid 19.NO  . Have you been around anyone who is awaiting Covid 19 test results in the past 7 to 10 days?NO . Have you been around anyone who has been exposed to Covid 19, or has mentioned symptoms of Covid 19 within the past 7 to 10 days?NO         Primary Cardiologist: Glenetta Hew, MD   Pt contacted.  History and symptoms reviewed.  Pt will f[facility]u with [facility] provider as scheduled.  Pt. advised that we are restricting visitors at this time and request that only patients present for check-in prior to their appointment.  All other visitors should remain in their car.  If necessary, only one visitor may come with the patient, into the building. For everyone's safety, all patients and visitor entering our practice area should expect to be screened again prior to entering our waiting area. [place] OR FACE MASK  Raiford Simmonds, RN  01/16/2019 6:10 PM

## 2019-01-19 ENCOUNTER — Other Ambulatory Visit: Payer: Self-pay

## 2019-01-19 ENCOUNTER — Ambulatory Visit (INDEPENDENT_AMBULATORY_CARE_PROVIDER_SITE_OTHER): Payer: Medicare HMO | Admitting: Cardiology

## 2019-01-19 VITALS — BP 140/68 | HR 65 | Temp 97.5°F | Ht 61.0 in | Wt 230.0 lb

## 2019-01-19 DIAGNOSIS — Z6841 Body Mass Index (BMI) 40.0 and over, adult: Secondary | ICD-10-CM

## 2019-01-19 DIAGNOSIS — E662 Morbid (severe) obesity with alveolar hypoventilation: Secondary | ICD-10-CM | POA: Diagnosis not present

## 2019-01-19 DIAGNOSIS — I110 Hypertensive heart disease with heart failure: Secondary | ICD-10-CM

## 2019-01-19 DIAGNOSIS — E785 Hyperlipidemia, unspecified: Secondary | ICD-10-CM | POA: Diagnosis not present

## 2019-01-19 DIAGNOSIS — I83893 Varicose veins of bilateral lower extremities with other complications: Secondary | ICD-10-CM | POA: Diagnosis not present

## 2019-01-19 DIAGNOSIS — I5032 Chronic diastolic (congestive) heart failure: Secondary | ICD-10-CM

## 2019-01-19 MED ORDER — HYDRALAZINE HCL 100 MG PO TABS: ORAL_TABLET | ORAL | 3 refills | Status: DC

## 2019-01-19 NOTE — Progress Notes (Signed)
PCP: [name] Chard, MD  Clinic Note: Chief Complaint  Patient presents with  . Follow-up    3 month f/u.  Meds reviewed verbally with pt.  . Edema    c/o edema ankles/feet    HPI: [name] is a 73 y.o. female with a PMH noted below who presents here for 71-monthfollow-up  PMH: nonocclusive CAD by cath in 2012 & 2019 (nonischemic Myoview in 2017 and 2019)  DM II, HTN,  with chronic pancreatitis.   morbid obesity with obesity hypoventilation syndrome and OSA not on CPAP,   D-CHF (evidence of increased right heart pressures on echo)  History of near syncope with palpitations and PACs on monitor,   venous insufficiency contributing to lower extremity edema   CVA and seizure disorder  RECENT HISTORY  October 2019 --> prior to her catheterization, we backed off on her Lasix to 80 mg twice daily.  (At that time her weight was 252 pounds)  05/15/2018 - ER-Hosp -sharp retrosternal CP relieved with nitroglycerin.  Because of recent negative Myoview, had coronary CTA which suggested moderate plaque in the LAD and CT FFR positive --> was referred for cardiac cath (only left heart and not right heart)  05[facility]27/2018 [name]. Dietary indiscretion (salty foods) --> increased diuretic as directed.  Pulmonolgy Referral for likely obesity hypoventilation syndrome.  Hydralazine increased to 573m TID (weight 252 pounds -and was maintaining steady weight at home as well.)  Seen on January 2 by Dr. AlElsworth Sohond pulmonary medicine --noted that sleep disordered breathing seem to have improved with weight loss.  Went from severe OSA in 2017 weighing 270 pounds down to mild OSA with a weight in the 250 pound range. He did note that she had daytime hypercarbia suggesting obesity hypoventilation syndrome.  He question whether this would also have improved.   I saw CaDisneyn January --> was doing pretty well.  Still gets short of breath if she goes too fast.  But  doing fine.  Was getting over the loss of a family member (actually son of her sister).  Was eating better.  Was pending CPAP evaluation. ->  Lasix have been adjusted to 80 mg Monday through Friday and then 40 mg in the weekend.  Plan was to take 100 mg hydralazine twice daily with a 50 mg dose at lunchtime.  [name] SPANIERas last seen on May 18 by KaJory SimsNP in a Telehealth visit.  She is doing very well, lost 20 pounds.  Plan was to consider follow-up echo.  Edema better, and no longer on Zaroxolyn.  Hydralazine was increased to 100 3 times daily.  Recent Hospitalizations:  None  Studies Personally Reviewed - (if available, images/films reviewed: From Epic Chart or Care Everywhere)  No new studies  Interval History: [name] here today overall doing pretty well.  She is very happy about the fact that she is still losing weight now.  She is doing okay with CPAP, but is having some times at night when she notices that she has a lot of productive cough when she first takes it off in the morning.  But she is sleeping better with it.  She is therefore doing much better with doing some light exercises off and on now she has been doing it about 4- 5 days a week.  She does use a cane to walk.  What she does is walk laps around her backyard and then  tries to do step ups a couple times a day.  She will get short of breath if she tries to go very fast and will sometimes feel a pressure in her chest, but does not usually with exertion.  Her swelling is pretty stable and she occasionally has cramps in the nighttime.  She is taking the Lasix 80 mg Monday through Friday and then 40 mg in the Saturday and Sunday.Edema is pretty well controlled.  As her blood pressure, she does indicate that she often forgets to take the lunchtime dose of hydralazine.  She will have some off-and-on episodes of lightheadedness maybe for 5 times during the course the day.  Not to the point that where she will feel  syncope or near syncope.  She often gets dizzy if she have to breathe fast.  Now that she is on CPAP, she really does not have orthopnea.  She is not using it she will get orthopnea.  But no PND.   She really denies any rapid, irregular heartbeats or palpitations.  No TIA or amaurosis fugax.  No claudication.   ROS: A comprehensive was performed. Review of Systems  Constitutional: Positive for malaise/fatigue (Continues to get better the more she loses weight.) and weight loss.  HENT: Negative for congestion and nosebleeds.   Respiratory: Positive for shortness of breath (Only with exertion). Negative for cough.   Cardiovascular: Negative for leg swelling (Well-controlled with current diuretic).  Gastrointestinal: Negative for blood in stool, diarrhea, heartburn (Part probably the reason for sleeping in a recliner), melena and nausea.  Genitourinary: Negative for hematuria.  Musculoskeletal: Positive for back pain and joint pain (Knees and ankles).       Walks with a cane  Neurological: Positive for dizziness (Per HPI: Often can have mild orthostasis in the morning, but also can have dizziness with hyperventilation.  No syncope.) and weakness (Her legs seem to be somewhat weak partly because her knees hurt.).  Psychiatric/Behavioral: Negative for memory loss. The patient is not nervous/anxious and does not have insomnia (As long she sleeps sitting up she does not have any real issues.).        She seems to be handling her adopted brother/nephews impending death relatively well.  She seems to be the one who is holding it strong and her sister is actually taken it worse (probably because she is biologically his mother).  She is eager to get back to see him now MD with her sister..  All other systems reviewed and are negative.  The patient does not have symptoms concerning for COVID-19 infection (fever, chills, cough, or new shortness of breath).  The patient is practicing social distancing.    COVID-19 Education: The signs and symptoms of COVID-19 were discussed with the patient and how to seek care for testing (follow up with PCP or arrange E-visit).   The importance of social distancing was discussed today.   I have reviewed and (if needed) personally updated the patient's problem list, medications, allergies, past medical and surgical history, social and family history.   Past Medical History:  Diagnosis Date  . Abnormal liver function     in the past.  . Anemia   . Arthritis    all over  . Bruises easily   . Cataract    right eye;immature  . Chronic back pain    stenosis  . Chronic cough   . Chronic kidney disease   . COPD (chronic obstructive pulmonary disease) ([place])   . Demand myocardial  infarction [facility]) 2012   Demand Infarction in setting of Pancreatitis --> mild Troponin elevation, NON-OBSTRUCTIVE CAD  . Depression    takes Abilify daily as well as Zoloft  . Diastolic heart failure 7494   Grade 1 diastolic Dysfunction by Echo   . Diverticulosis   . DM (diabetes mellitus) ([place])    takes Victoza daily as well as Lantus and Humalog  . Empty sella ([place])    on MRI in 2009.  . Glaucoma   . Headache(784.0)    last migraine-4-64yr ago  . History of blood transfusion    no abnormal reaction noted  . History of colon polyps    benign  . HTN (hypertension)    takes BKinder Morgan Energydaily  . Hyperlipidemia    takes Lipitor daily  . Joint swelling   . Nocturia   . Ob[place]entilation syndrome ([place]   . Obstructive sleep apnea 02/2018   Notably improved split-night study with weight loss from 270 (2017) down to 250 pounds (2019).  . Pancreatitis    takes Pancrelipase daily  . [place]'s disease [place]    takes Sinemet daily  . Peripheral neuropathy   . Pneumonia 2012  . Seizures [place]    takes Lamictal daily and Primidone nightly;last seizure 2wks ago  . Urinary urgency    With increased frequency  . Varicose veins of both lower  extremities with pain    With edema.  Takes daily Lasix    Past Surgical History:  Procedure Laterality Date  . ABDOMINAL HYSTERECTOMY    . APPENDECTOMY    . BACK SURGERY    . CARDIAC CATHETERIZATION  2009/March 2012   2009: (Dr. KAlvie Heidelberg Nonobstructive CAD; 2012: Minimal CAD --> false positive stress test  . Cardiac Event Monitor  September-October 2017   Sinus rhythm with occasional PACs and artifact. No arrhythmias besides one short run of tachycardia.  . CHOLECYSTECTOMY    . COLONOSCOPY N/A 08/15/2012   Procedure: COLONOSCOPY;  Surgeon: WArta Silence MD;  Location: [facility];  Service: Endoscopy;  Laterality: N/A;  . COLONOSCOPY WITH PROPOFOL Left 06/17/2017   Procedure: COLONOSCOPY WITH PROPOFOL;  Surgeon: KRonnette Juniper MD;  [facility]  Service: Gastroenterology;  Laterality: Left;  . CORONARY CALCIUM SCORE AND CTA  04/2018   Coronary calcium score 71.9.  Very large, hyperdynamic LAD wrapping apex giving rise to PDA.  Moderate LAD-diagonal and circumflex plaque -> CT FFR suggested positive findings in distal D1, D2 and distal circumflex.  Referred for cath. ==> FALSE POSITIVE  . ESOPHAGOGASTRODUODENOSCOPY    . eye cysts Bilateral   . LASIK    . LEFT HEART CATH AND CORONARY ANGIOGRAPHY N/A 05/16/2018   Procedure: LEFT HEART CATH AND CORONARY ANGIOGRAPHY;  Surgeon: SBelva Crome MD;  Location: [facility] INVASIVE CV LAB:  Angiographically normal coronary arteries with LVEDP of 21 mmHg.  Large draping hyperdominant LAD that wraps the apex and provides distal half of the PDA.  Relatively small caliber distal Cx -->proxLPDA, non-dom RCA. No Cx or Diag lesions -- FALSE + CT FFR  . NM MYOVIEW LTD  01/2016; 01/2018   a)LOW RISK. No ischemia or infarction;; b) EF 55-60%. NO ST changes.  No ischemia or Infarction. NO significant RV enlargement.  LOW RISK.  .Marland KitchenPolysomnogram  02/2018   (Dr. DBrett Fairyfrom neurology): Split study was not adequately done due to low AHI.  She slept reclined  and had an AHI less than 10, prolonged hypoxemia and no hypercapnia. -->  She has  home oxygen already prescribed, but did not meet criteria for nightly oxygen.  (Was noted that the patient did not cooperate well with study.  Titration was discussed.  Marland Kitchen RECTAL POLYPECTOMY    . TONSILLECTOMY    . TRANSTHORACIC ECHOCARDIOGRAM  01/2016; 05/2017   A) Normal LV chamber size with mod LVH pattern. EF 50-55%. Severe LA dilation. Mod RA dilation. PAP elevated at 38 mmHg;; B) Mild concentric LVH.  EF 55-60% & no RWMA.  Grade 2 DD.  Diastolic flattening of the ventricular septum == ? elevated PAP.  Mild aortic valve calcification/sclerosis.  Mod LA dilation.  Mild RV dilation & Mod RA dilation  . TRANSTHORACIC ECHOCARDIOGRAM  10/2017; 01/2018   a) EF 55-60%.  No RWMA,  Moderate LA dilation.  Mild LA dilation.  Peak PA pressures ~57 mmHg (moderate);;; b) EF 55-60%.  GRII DD.  Suggestion of RV volume overload with moderate RV dilation.  Severe LA and RA dilation.Marland Kitchen  PA pressure ~67%.  . TUBAL LIGATION    . vaginal cyst removed     several times    Current Meds  Medication Sig  . acetaminophen (TYLENOL) 500 MG tablet Take 500 mg by mouth every 4 (four) hours as needed (for pain).   Marland Kitchen albuterol (PROVENTIL HFA;VENTOLIN HFA) 108 (90 Base) MCG/ACT inhaler Inhale 1-2 puffs into the lungs every 6 (six) hours as needed for wheezing or shortness of breath (DOE WHEEZING SOB).  Marland Kitchen atorvastatin (LIPITOR) 10 MG tablet TAKE 1 TABLET EVERY DAY  . B Complex-C (B-COMPLEX WITH VITAMIN C) tablet Take 1 tablet by mouth daily.  . Blood Glucose Monitoring Suppl (ACCU-CHEK NANO SMARTVIEW) W/DEVICE KIT Use to check blood sugar 3 times per day dx code E11.65  . carvedilol (COREG) 25 MG tablet Take 1 tablet (25 mg total) by mouth 2 (two) times daily with a meal.  . Cholecalciferol (VITAMIN D-3) 1000 units CAPS Take 1,000 Units by mouth daily.  . diclofenac sodium (VOLTAREN) 1 % GEL Apply 2 g topically 4 (four) times daily as needed  (pain).  . DROPLET PEN NEEDLES 31G X 8 MM MISC USE DAILY WITH VICTOZA AND/OR NOVOLOG.   . furosemide (LASIX) 40 MG tablet Take one tablet (31m) by mouth on Saturday and Sunday only.  . furosemide (LASIX) 80 MG tablet Take one tablet (829m by mouth Monday through Friday only.  . hydrALAZINE (APRESOLINE) 100 MG tablet Take 100 mg twice a day , if blood pressure is greater than 150 mmhg take an extra tablet .  . Marland Kitchennsulin Glargine, 1 Unit Dial, (TOUJEO SOLOSTAR) 300 UNIT/ML SOPN Inject 36 Units into the skin at bedtime. (Patient taking differently: Inject 34 Units into the skin at bedtime. )  . isosorbide mononitrate (IMDUR) 60 MG 24 hr tablet Take 1 tablet (60 mg total) by mouth daily.  . Marland KitchenamoTRIgine (LAMICTAL) 100 MG tablet Take 1 tablet by mouth twice daily  . Menthol, Topical Analgesic, 3.5 % GEL Apply 1 application topically as needed (for neuropathy in feet).  . metolazone (ZAROXOLYN) 2.5 MG tablet Take 1 tablet (2.5 mg total) by mouth See admin instructions. Take 1 tablet Monday and Friday only if weight is greater or equal to 269  . Multiple Vitamins-Minerals (MULTIVITAMIN WITH MINERALS) tablet Take 1 tablet by mouth daily.  . Marland KitchenZEMPIC, 1 MG/DOSE, 2 MG/1.5ML SOPN INJECT 1MG INTO  THE SKIN ONCE A WEEK  . Pancrelipase, Lip-Prot-Amyl, (ZENPEP) 25000-79000 units CPEP Take 1-2 capsules by mouth See admin instructions. Take 2 capsules with meals and  1 capsule with snacks  . potassium chloride SA (K-DUR) 20 MEQ tablet TAKE 2 TABLETS ONCE DAILY. BUT ON THE DAY YOU  TAKE METOLAZONE AN  EXTRA TABLET FOR A TOTAL OF 3 TABLETS (60MEQ)  . sertraline (ZOLOFT) 50 MG tablet TAKE 1 TABLET EVERY DAY  . [DISCONTINUED] hydrALAZINE (APRESOLINE) 100 MG tablet Take 1 tablet (100 mg total) by mouth 3 (three) times daily.    Allergies  Allergen Reactions  . Other Anaphylaxis and Rash    Bleach  . Penicillins Hives    Has patient had a PCN reaction causing immediate rash, facial/tongue/throat swelling, SOB or  lightheadedness with hypotension: Yes Has patient had a PCN reaction causing severe rash involving mucus membranes or skin necrosis: No Has patient had a PCN reaction that required hospitalization: No Has patient had a PCN reaction occurring within the last 10 years: No If all of the above answers are "NO", then may proceed with Cephalosporin use.   . Sulfa Antibiotics Hives  . Aspirin Other (See Comments)     On aspirin 81 mg - Rectal bleeding in dec 2018  . Codeine     Headache and makes the patient feel "off"    Social History   Tobacco Use  . Smoking status: Former Smoker    Packs/day: 0.50    Years: 25.00    Pack years: 12.50    Types: Cigarettes  . Smokeless tobacco: Never Used  . Tobacco comment: quit smoking 20+ytrs ago  Substance Use Topics  . Alcohol use: No  . Drug use: No   Social History   Social History Narrative   She lives in [place]. She has lots of family in the area and is accompanied by her sister.   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   She has a distant history of smoking, quit 20 years ago.    family history includes Allergies in her father; Cancer in an other family member; Diabetes in her brother, mother, sister, and son; Heart disease in her father, mother, and sister; Heart failure in her father; Hyperlipidemia in her father, mother, and sister; Hypertension in her brother, father, mother, sister, sister, and son.  Wt Readings from Last 3 Encounters:  01/21/19 232 lb (105.2 kg)  01/19/19 230 lb (104.3 kg)  12/23/18 233 lb 9.6 oz (106 kg)    PHYSICAL EXAM BP 140/68 (BP Location: Left Arm, Patient Position: Sitting, Cuff Size: Large)   Pulse 65   Temp (!) 97.5 F (36.4 C)   Ht _0  (1.549 m)   Wt 230 lb (104.3 kg)   SpO2 93%   BMI 43.46 kg/m  Physical Exam  Constitutional: She is oriented to person, place, and time. She appears well-developed and well-nourished. No distress.  Noticeably lower  weight today.  Remains morbidly obese.  Well-groomed.  She does have some chronic disability noted with valgus feet/ankles etc.   HENT:  Head: Normocephalic and atraumatic.  Neck: Normal range of motion. Neck supple. Hepatojugular reflux (8-10 cm water.) present. No JVD present. Carotid bruit is not present.  Cardiovascular: Normal rate, regular rhythm, S1 normal and S2 normal. PMI is not displaced (Cannot palpate). Exam reveals distant heart sounds and decreased pulses (Mildly decreased pedal pulses because of body habitus.). Exam reveals no gallop and no friction rub.  Murmur (2/6 HSM heard along the sternal border and apex.  More prominent toward the right sternal border than left.) heard. Pulmonary/Chest: Effort normal. No respiratory distress.  She has no wheezes. She exhibits tenderness (Parasternal).  Distant breath sounds, may be mild basal crackles (they clear with cough) but no rales or rhonchi.  Abdominal: Soft. Bowel sounds are normal. She exhibits no distension. There is no abdominal tenderness. There is no rebound.  Obese.  Unable to palpate PMI  Musculoskeletal: Normal range of motion.        General: Deformity (Her feet splayed out in a valgus formation.) and edema (Diffuse puffy edema with mild venous stasis changes.) present.  Neurological: She is alert and oriented to person, place, and time.  Psychiatric: She has a normal mood and affect. Her behavior is normal. Judgment and thought content normal.  Slightly sad today discussing her "brother "  Vitals reviewed.   Adult ECG Report NSR, 1 degree AVB.  Rate 65.  Incomplete RBBB.  Stable EKG   Other studies Reviewed: Additional studies/ records that were reviewed today include:  Recent Labs:   Lab Results  Component Value Date   CREATININE 0.95 11/28/2018   BUN 19 11/28/2018   NA 135 11/28/2018   K 3.9 11/28/2018   CL 96 11/28/2018   CO2 26 11/28/2018   Lab Results  Component Value Date   CHOL 145 10/22/2018   HDL  70 10/22/2018   LDLCALC 65 10/22/2018   LDLDIRECT 66.0 01/27/2015   TRIG 52 10/22/2018   CHOLHDL 2.1 10/22/2018    ASSESSMENT / PLAN: Problem List Items Addressed This Visit    Varicose veins of bilateral lower extremities with other complications (Chronic)    Continue support stockings if possible.  Continue with current diuretic regimen.  Seems to be doing well with this plan.  We will refill furosemide.      Relevant Medications   hydrALAZINE (APRESOLINE) 100 MG tablet   Obesity hypoventilation syndrome (HCC) (Chronic)    Being followed by pulmonary medicine.  Is now on CPAP.  Weight loss is crucial.  She is on Imdur, but is not on calcium channel blocker which would be another potential to help treating elevated pulmonary pressures.      Morbid obesity with BMI of 45.0-49.9, adult ([place]) (Chronic)    She is really doing a good job with weight loss.  She is dropped another 10 pound since last time I saw her.  I congratulated her on her efforts.  Continue to adjust her diet and exercise.  The more she exercises, the better she feels.      Hypertensive heart disease with chronic diastolic congestive heart failure ([place]) - Primary (Chronic)    She does have diastolic heart failure symptoms to some extent, but probably also some right-sided heart failure from her other comorbidities of lung disease and obesity. Is on a frequent dose of hydralazine plus nitrate along with carvedilol. Now on standing dose of furosemide with ability take additional dose as needed.  Now no longer really taking metolazone.  Would like to restart ARB with her next visit if her pressures will tolerate.  (Would probably start valsartan 80 mg)      Relevant Medications   hydrALAZINE (APRESOLINE) 100 MG tablet   Other Relevant Orders   EKG 12-Lead (Completed)   Dyslipidemia, goal LDL below 100 (Chronic)    Relatively well controlled based on labs earlier this year.  Continue current dose of atorvastatin.       Relevant Medications   hydrALAZINE (APRESOLINE) 100 MG tablet   Chronic diastolic CHF (congestive heart failure), NYHA class 2 (HCC) (Chronic)   Relevant  Medications   hydrALAZINE (APRESOLINE) 100 MG tablet   Other Relevant Orders   EKG 12-Lead (Completed)      I spent a total of 25 minutes with the patient and chart review. >  50% of the time was spent in direct patient consultation.   Current medicines are reviewed at length with the patient today.  (+/- concerns) -forgets to take her lunchtime dose of HCTZ  Patient Instructions  Medication Instructions:    CONTINUE Hydralazine Twice a day  -  MAY use 3 rd  Dose as needed systolic greater than 276 mmHg ( top number)   If you need a refill on your cardiac medications before your next appointment, please call your pharmacy.   Lab work: Not needed   Testing/Procedures: Not needed  Follow-Up: At [facility], you and your health needs are our priority.  As part of our continuing mission to provide you with exceptional heart care, we have created designated Provider Care Teams.  These Care Teams include your primary Cardiologist (physician) and Advanced Practice Providers (APPs -  Physician Assistants and Nurse Practitioners) who all work together to provide you with the care you need, when you need it. . You will need a follow up appointment in 12 months.  Please call our office 2 months in advance to schedule this appointment.  You may see Glenetta Hew, MD or one of the following Advanced Practice Providers on your designated Care Team:   . Rosaria Ferries, PA-C . [name] Sims, DNP, ANP- Your physician recommends that you schedule a follow-up appointment in 6 month. .   Any Other Special Instructions Will Be Listed Below (If Applicable).  Studies Ordered:   Orders Placed This Encounter  Procedures  . EKG 12-Lead      Glenetta Hew, M.D., M.S. Interventional Cardiologist   Pager # [phone] Phone #  [phone] [address] [place], [place] [address]   Thank you for choosing Heartcare at [facility]!!

## 2019-01-19 NOTE — Patient Instructions (Signed)
Medication Instructions:    CONTINUE Hydralazine Twice a day  -  MAY use 3 rd  Dose as needed systolic greater than 975 mmHg ( top number)   If you need a refill on your cardiac medications before your next appointment, please call your pharmacy.   Lab work: Not needed   Testing/Procedures: Not needed  Follow-Up: At [facility], you and your health needs are our priority.  As part of our continuing mission to provide you with exceptional heart care, we have created designated Provider Care Teams.  These Care Teams include your primary Cardiologist (physician) and Advanced Practice Providers (APPs -  Physician Assistants and Nurse Practitioners) who all work together to provide you with the care you need, when you need it. . You will need a follow up appointment in 12 months.  Please call our office 2 months in advance to schedule this appointment.  You may see Glenetta Hew, MD or one of the following Advanced Practice Providers on your designated Care Team:   . Rosaria Ferries, PA-C . Jory Sims, DNP, ANP- Your physician recommends that you schedule a follow-up appointment in 6 month. .   Any Other Special Instructions Will Be Listed Below (If Applicable).

## 2019-01-21 ENCOUNTER — Encounter: Payer: Self-pay | Admitting: Cardiology

## 2019-01-21 ENCOUNTER — Encounter: Payer: Self-pay | Admitting: Internal Medicine

## 2019-01-21 ENCOUNTER — Ambulatory Visit (INDEPENDENT_AMBULATORY_CARE_PROVIDER_SITE_OTHER): Payer: Medicare HMO | Admitting: Internal Medicine

## 2019-01-21 ENCOUNTER — Other Ambulatory Visit: Payer: Self-pay

## 2019-01-21 VITALS — BP 112/64 | HR 64 | Temp 98.1°F | Ht 61.0 in | Wt 232.0 lb

## 2019-01-21 DIAGNOSIS — E1122 Type 2 diabetes mellitus with diabetic chronic kidney disease: Secondary | ICD-10-CM | POA: Diagnosis not present

## 2019-01-21 DIAGNOSIS — M069 Rheumatoid arthritis, unspecified: Secondary | ICD-10-CM | POA: Insufficient documentation

## 2019-01-21 DIAGNOSIS — Z794 Long term (current) use of insulin: Secondary | ICD-10-CM | POA: Diagnosis not present

## 2019-01-21 DIAGNOSIS — I110 Hypertensive heart disease with heart failure: Secondary | ICD-10-CM

## 2019-01-21 DIAGNOSIS — E66813 Obesity, class 3: Secondary | ICD-10-CM

## 2019-01-21 DIAGNOSIS — F339 Major depressive disorder, recurrent, unspecified: Secondary | ICD-10-CM

## 2019-01-21 DIAGNOSIS — I5032 Chronic diastolic (congestive) heart failure: Secondary | ICD-10-CM | POA: Diagnosis not present

## 2019-01-21 DIAGNOSIS — I25118 Atherosclerotic heart disease of native coronary artery with other forms of angina pectoris: Secondary | ICD-10-CM

## 2019-01-21 DIAGNOSIS — N182 Chronic kidney disease, stage 2 (mild): Secondary | ICD-10-CM | POA: Diagnosis not present

## 2019-01-21 DIAGNOSIS — Z6841 Body Mass Index (BMI) 40.0 and over, adult: Secondary | ICD-10-CM | POA: Diagnosis not present

## 2019-01-21 DIAGNOSIS — I69354 Hemiplegia and hemiparesis following cerebral infarction affecting left non-dominant side: Secondary | ICD-10-CM

## 2019-01-21 DIAGNOSIS — I130 Hypertensive heart and chronic kidney disease with heart failure and stage 1 through stage 4 chronic kidney disease, or unspecified chronic kidney disease: Secondary | ICD-10-CM | POA: Diagnosis not present

## 2019-01-21 NOTE — Assessment & Plan Note (Signed)
She does have diastolic heart failure symptoms to some extent, but probably also some right-sided heart failure from her other comorbidities of lung disease and obesity. Is on a frequent dose of hydralazine plus nitrate along with carvedilol. Now on standing dose of furosemide with ability take additional dose as needed.  Now no longer really taking metolazone.  Would like to restart ARB with her next visit if her pressures will tolerate.  (Would probably start valsartan 80 mg)

## 2019-01-21 NOTE — Assessment & Plan Note (Signed)
Relatively well controlled based on labs earlier this year.  Continue current dose of atorvastatin.

## 2019-01-21 NOTE — Assessment & Plan Note (Signed)
She is really doing a good job with weight loss.  She is dropped another 10 pound since last time I saw her.  I congratulated her on her efforts.  Continue to adjust her diet and exercise.  The more she exercises, the better she feels.

## 2019-01-21 NOTE — Assessment & Plan Note (Signed)
Being followed by pulmonary medicine.  Is now on CPAP.  Weight loss is crucial.  She is on Imdur, but is not on calcium channel blocker which would be another potential to help treating elevated pulmonary pressures.

## 2019-01-21 NOTE — Assessment & Plan Note (Addendum)
Continue support stockings if possible.  Continue with current diuretic regimen.  Seems to be doing well with this plan.  We will refill furosemide.

## 2019-01-21 NOTE — Progress Notes (Signed)
Subjective:     Patient ID: [name] , female    DOB: 08/02/1945 , 73 y.o.   MRN: [id number]   Chief Complaint  Patient presents with  . Hypertension  . Diabetes    HPI  Hypertension This is a chronic problem. The current episode started more than 1 year ago. The problem has been gradually improving since onset. The problem is controlled. Pertinent negatives include no blurred vision, chest pain, palpitations or shortness of breath. Past treatments include diuretics and beta blockers. The current treatment provides moderate improvement. Hypertensive end-organ damage includes kidney disease. Identifiable causes of hypertension include sleep apnea.  Diabetes She presents for her follow-up diabetic visit. She has type 2 diabetes mellitus. Her disease course has been improving. There are no hypoglycemic associated symptoms. Pertinent negatives for diabetes include no blurred vision and no chest pain. There are no hypoglycemic complications. Diabetic complications include nephropathy. Risk factors for coronary artery disease include diabetes mellitus, dyslipidemia, hypertension, obesity, post-menopausal and sedentary lifestyle. She is compliant with treatment some of the time. Her weight is fluctuating minimally. She is following a generally healthy diet. She never participates in exercise. Her home blood glucose trend is decreasing steadily. Her breakfast blood glucose is taken between 8-9 am. Her breakfast blood glucose range is generally 130-140 mg/dl. An ACE inhibitor/angiotensin II receptor blocker is being taken. Eye exam is not current.     Past Medical History:  Diagnosis Date  . Abnormal liver function     in the past.  . Anemia   . Arthritis    all over  . Bruises easily   . Cataract    right eye;immature  . Chronic back pain    stenosis  . Chronic cough   . Chronic kidney disease   . COPD (chronic obstructive pulmonary disease) ([place])   . Demand myocardial infarction  [facility]) 2012   Demand Infarction in setting of Pancreatitis --> mild Troponin elevation, NON-OBSTRUCTIVE CAD  . Depression    takes Abilify daily as well as Zoloft  . Diastolic heart failure 2876   Grade 1 diastolic Dysfunction by Echo   . Diverticulosis   . DM (diabetes mellitus) ([place])    takes Victoza daily as well as Lantus and Humalog  . Empty sella ([place])    on MRI in 2009.  . Glaucoma   . Headache(784.0)    last migraine-4-53yr ago  . History of blood transfusion    no abnormal reaction noted  . History of colon polyps    benign  . HTN (hypertension)    takes BKinder Morgan Energydaily  . Hyperlipidemia    takes Lipitor daily  . Joint swelling   . Nocturia   . Ob[place] hyp[place]ilation syndrome ([place]   . Obstructive sleep apnea 02/2018   Notably improved split-night study with weight loss from 270 (2017) down to 250 pounds (2019).  . Pancreatitis    takes Pancrelipase daily  . Parkinson's disease ([place]    takes Sinemet daily  . Peripheral neuropathy   . Pneumonia 2012  . Seizures ([place]    takes Lamictal daily and Primidone nightly;last seizure 2wks ago  . Urinary urgency    With increased frequency  . Varicose veins of both lower extremities with pain    With edema.  Takes daily Lasix     Family History  Problem Relation Age of Onset  . Allergies Father   . Heart disease Father        before 651 .  Heart failure Father   . Hypertension Father   . Hyperlipidemia Father   . Heart disease Mother   . Diabetes Mother   . Hypertension Mother   . Hyperlipidemia Mother   . Hypertension Sister   . Heart disease Sister        before 35  . Hyperlipidemia Sister   . Diabetes Brother   . Hypertension Brother   . Diabetes Sister   . Hypertension Sister   . Diabetes Son   . Hypertension Son   . Cancer Other      Current Outpatient Medications:  .  acetaminophen (TYLENOL) 500 MG tablet, Take 500 mg by mouth every 4 (four) hours as needed (for pain). , Disp: ,  Rfl:  .  albuterol (PROVENTIL HFA;VENTOLIN HFA) 108 (90 Base) MCG/ACT inhaler, Inhale 1-2 puffs into the lungs every 6 (six) hours as needed for wheezing or shortness of breath (DOE WHEEZING SOB)., Disp: 1 Inhaler, Rfl: 1 .  atorvastatin (LIPITOR) 10 MG tablet, TAKE 1 TABLET EVERY DAY, Disp: 90 tablet, Rfl: 1 .  B Complex-C (B-COMPLEX WITH VITAMIN C) tablet, Take 1 tablet by mouth daily., Disp: , Rfl:  .  Blood Glucose Monitoring Suppl (ACCU-CHEK NANO SMARTVIEW) W/DEVICE KIT, Use to check blood sugar 3 times per day dx code E11.65, Disp: 1 kit, Rfl: 0 .  carvedilol (COREG) 25 MG tablet, Take 1 tablet (25 mg total) by mouth 2 (two) times daily with a meal., Disp: 180 tablet, Rfl: 3 .  Cholecalciferol (VITAMIN D-3) 1000 units CAPS, Take 1,000 Units by mouth daily., Disp: , Rfl:  .  diclofenac sodium (VOLTAREN) 1 % GEL, Apply 2 g topically 4 (four) times daily as needed (pain)., Disp: , Rfl:  .  DROPLET PEN NEEDLES 31G X 8 MM MISC, USE DAILY WITH VICTOZA AND/OR NOVOLOG. , Disp: 400 each, Rfl: 1 .  furosemide (LASIX) 40 MG tablet, Take one tablet (80m) by mouth on Saturday and Sunday only., Disp: 180 tablet, Rfl: 3 .  furosemide (LASIX) 80 MG tablet, Take one tablet (850m by mouth Monday through Friday only., Disp: 90 tablet, Rfl: 3 .  hydrALAZINE (APRESOLINE) 100 MG tablet, Take 100 mg twice a day , if blood pressure is greater than 150 mmhg take an extra tablet ., Disp: 90 tablet, Rfl: 3 .  Insulin Glargine, 1 Unit Dial, (TOUJEO SOLOSTAR) 300 UNIT/ML SOPN, Inject 36 Units into the skin at bedtime. (Patient taking differently: Inject 34 Units into the skin at bedtime. ), Disp: 15 pen, Rfl: 2 .  isosorbide mononitrate (IMDUR) 60 MG 24 hr tablet, Take 1 tablet (60 mg total) by mouth daily., Disp: 90 tablet, Rfl: 3 .  lamoTRIgine (LAMICTAL) 100 MG tablet, Take 1 tablet by mouth twice daily, Disp: 180 tablet, Rfl: 0 .  Menthol, Topical Analgesic, 3.5 % GEL, Apply 1 application topically as needed (for  neuropathy in feet)., Disp: 120 mL, Rfl: 0 .  metolazone (ZAROXOLYN) 2.5 MG tablet, Take 1 tablet (2.5 mg total) by mouth See admin instructions. Take 1 tablet Monday and Friday only if weight is greater or equal to 269, Disp: 10 tablet, Rfl: 0 .  Multiple Vitamins-Minerals (MULTIVITAMIN WITH MINERALS) tablet, Take 1 tablet by mouth daily., Disp: , Rfl:  .  OZEMPIC, 1 MG/DOSE, 2 MG/1.5ML SOPN, INJECT 1MG INTO  THE SKIN ONCE A WEEK, Disp: 12 mL, Rfl: 0 .  Pancrelipase, Lip-Prot-Amyl, (ZENPEP) 25000-79000 units CPEP, Take 1-2 capsules by mouth See admin instructions. Take 2 capsules with meals and  1 capsule with snacks, Disp: , Rfl:  .  potassium chloride SA (K-DUR) 20 MEQ tablet, TAKE 2 TABLETS ONCE DAILY. BUT ON THE DAY YOU  TAKE METOLAZONE AN  EXTRA TABLET FOR A TOTAL OF 3 TABLETS (60MEQ), Disp: 204 tablet, Rfl: 1 .  sertraline (ZOLOFT) 50 MG tablet, TAKE 1 TABLET EVERY DAY, Disp: 90 tablet, Rfl: 2 .  HYDROcodone-acetaminophen (NORCO/VICODIN) 5-325 MG tablet, Take 1 tablet by mouth every 6 (six) hours as needed for moderate pain. (Patient not taking: Reported on 01/21/2019), Disp: 30 tablet, Rfl: 0   Allergies  Allergen Reactions  . Other Anaphylaxis and Rash    Bleach  . Penicillins Hives    Has patient had a PCN reaction causing immediate rash, facial/tongue/throat swelling, SOB or lightheadedness with hypotension: Yes Has patient had a PCN reaction causing severe rash involving mucus membranes or skin necrosis: No Has patient had a PCN reaction that required hospitalization: No Has patient had a PCN reaction occurring within the last 10 years: No If all of the above answers are "NO", then may proceed with Cephalosporin use.   . Sulfa Antibiotics Hives  . Aspirin Other (See Comments)     On aspirin 81 mg - Rectal bleeding in dec 2018  . Codeine     Headache and makes the patient feel "off"     Review of Systems  Constitutional: Negative.   Eyes: Negative for blurred vision.   Respiratory: Negative.  Negative for shortness of breath.   Cardiovascular: Negative.  Negative for chest pain and palpitations.  Gastrointestinal: Negative.   Neurological: Negative.   Psychiatric/Behavioral: Negative.      Today's Vitals   01/21/19 1150  BP: 112/64  Pulse: 64  Temp: 98.1 F (36.7 C)  TempSrc: Oral  Weight: 232 lb (105.2 kg)  Height: _0  (1.549 m)  PainSc: 0-No pain   Body mass index is 43.84 kg/m.   Objective:  Physical Exam Vitals signs and nursing note reviewed.  Constitutional:      Appearance: Normal appearance.  HENT:     Head: Normocephalic and atraumatic.  Cardiovascular:     Rate and Rhythm: Normal rate and regular rhythm.     Heart sounds: Normal heart sounds.  Pulmonary:     Effort: Pulmonary effort is normal.     Breath sounds: Normal breath sounds.  Musculoskeletal:     Right lower leg: 2+ Pitting Edema present.     Left lower leg: 2+ Pitting Edema present.  Skin:    General: Skin is warm.  Neurological:     General: No focal deficit present.     Mental Status: She is alert.  Psychiatric:        Mood and Affect: Mood normal.        Behavior: Behavior normal.         Assessment And Plan:     1. Hypertensive heart disease with chronic diastolic congestive heart failure ([place])  Well controlled. She will continue with current meds. She is encouraged to avoid adding salt to her foods.   2. Chronic diastolic CHF (congestive heart failure), NYHA class 2 (HCC)  Chronic, yet stable. Importance of salt restriction was discussed with the patient.   3. Atherosclerotic heart disease of native coronary artery with other forms of angina pectoris (HCC)  Chronic, yet stable. Importance of statin and dietary compliance was discussed with the patient.   4. Type 2 diabetes mellitus with stage 2 chronic kidney disease, with long-term current use of  insulin ([place])  I will check labs as listed below.  Importance of dietary and exercise  compliance was discussed with the patient.   - Hemoglobin A1c - BMP8+EGFR  5. 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   6. Recurrent depression (HCC)  Chronic, yet stable. She will continue with current meds.   7. Class 3 severe obesity due to excess calories with serious comorbidity and body mass index (BMI) of 40.0 to 44.9 in adult [facility])  Importance of achieving optimal weight to decrease risk of cardiovascular disease and cancers was discussed with the patient in full detail. She is encouraged to start slowly - start with 10 minutes twice daily at least three to four days per week and to gradually build to 30 minutes five days weekly. She is ambulatory with four prong cane. She is encouraged to incorporate chair exercises into her daily routine.     [name], MD    THE PATIENT IS ENCOURAGED TO PRACTICE SOCIAL DISTANCING DUE TO THE COVID-19 PANDEMIC.

## 2019-01-22 LAB — BMP8+EGFR
BUN/Creatinine Ratio: 14 (ref 12–28)
BUN: 13 mg/dL (ref 8–27)
CO2: 24 mmol/L (ref 20–29)
Calcium: 9.3 mg/dL (ref 8.7–10.3)
Chloride: 95 mmol/L — ABNORMAL LOW (ref 96–106)
Creatinine, Ser: 0.92 mg/dL (ref 0.57–1.00)
GFR calc Af Amer: 73 mL/min/{1.73_m2} (ref >59)
GFR calc non Af Amer: 63 mL/min/{1.73_m2} (ref >59)
Glucose: 106 mg/dL — ABNORMAL HIGH (ref 65–99)
Potassium: 3.7 mmol/L (ref 3.5–5.2)
Sodium: 135 mmol/L (ref 134–144)

## 2019-01-22 LAB — HEMOGLOBIN A1C
Est. average glucose Bld gHb Est-mCnc: 200 mg/dL
Hgb A1c MFr Bld: 8.6 % — ABNORMAL HIGH (ref 4.8–5.6)

## 2019-01-23 ENCOUNTER — Telehealth: Payer: Self-pay

## 2019-01-23 NOTE — Telephone Encounter (Signed)
-----   Message from [name], MD sent at 01/22/2019  6:44 PM EDT ----- Your hba1c is 8.6, this is up from last visit. I would like to have my chronic care management team contact you regarding your diabetes. There is a Marine scientist, pharmacist and social worker on the team. Are you okay with this? We must address your elevated sugars and take this seriously. This will require for you to pay closer attention to your eating and making better food choices.  Kidney function is stable.

## 2019-01-23 NOTE — Telephone Encounter (Signed)
Left the patient a message to call back for lab results. 

## 2019-01-24 HISTORY — PX: TRANSTHORACIC ECHOCARDIOGRAM: SHX275

## 2019-01-27 ENCOUNTER — Ambulatory Visit
Admission: RE | Admit: 2019-01-27 | Discharge: 2019-01-27 | Disposition: A | Discharge status: Home / Self Care | Payer: Medicare HMO | Source: Ambulatory Visit | Attending: Internal Medicine | Admitting: Internal Medicine

## 2019-01-27 DIAGNOSIS — K458 Other specified abdominal hernia without obstruction or gangrene: Secondary | ICD-10-CM

## 2019-01-27 DIAGNOSIS — K5730 Diverticulosis of large intestine without perforation or abscess without bleeding: Secondary | ICD-10-CM | POA: Diagnosis not present

## 2019-01-27 DIAGNOSIS — K429 Umbilical hernia without obstruction or gangrene: Secondary | ICD-10-CM | POA: Diagnosis not present

## 2019-01-27 MED ORDER — IOPAMIDOL (ISOVUE-300) INJECTION 61%
125.0000 mL | Freq: Once | INTRAVENOUS | Status: AC | PRN
  Administered 2019-01-27: 125 mL via INTRAVENOUS

## 2019-01-29 ENCOUNTER — Ambulatory Visit: Payer: Self-pay | Admitting: *Deleted

## 2019-01-30 ENCOUNTER — Other Ambulatory Visit: Payer: Self-pay

## 2019-01-30 DIAGNOSIS — E1122 Type 2 diabetes mellitus with diabetic chronic kidney disease: Secondary | ICD-10-CM

## 2019-02-02 ENCOUNTER — Telehealth: Payer: Self-pay

## 2019-02-02 DIAGNOSIS — G4733 Obstructive sleep apnea (adult) (pediatric): Secondary | ICD-10-CM | POA: Diagnosis not present

## 2019-02-02 NOTE — Telephone Encounter (Signed)
The pt called and said that that she has been possibly been exposed to the coronavirus because her one of her son's coworkers tested positive for the virus and his whole job has to be tested.  The pt was told that Laurance Flatten, NP said to let the office know if her son has a positive test result so that the pt can be sent for covid testing.

## 2019-02-05 ENCOUNTER — Telehealth: Payer: Self-pay

## 2019-02-05 NOTE — Telephone Encounter (Signed)
The pt called and left a message that her son tested negative for the coronavirus so she is ok.

## 2019-02-06 ENCOUNTER — Ambulatory Visit: Payer: Self-pay

## 2019-02-06 ENCOUNTER — Ambulatory Visit (INDEPENDENT_AMBULATORY_CARE_PROVIDER_SITE_OTHER): Payer: Medicare HMO

## 2019-02-06 DIAGNOSIS — E1122 Type 2 diabetes mellitus with diabetic chronic kidney disease: Secondary | ICD-10-CM | POA: Diagnosis not present

## 2019-02-06 DIAGNOSIS — Z794 Long term (current) use of insulin: Secondary | ICD-10-CM | POA: Diagnosis not present

## 2019-02-06 DIAGNOSIS — I5032 Chronic diastolic (congestive) heart failure: Secondary | ICD-10-CM

## 2019-02-06 DIAGNOSIS — I110 Hypertensive heart disease with heart failure: Secondary | ICD-10-CM

## 2019-02-06 DIAGNOSIS — N182 Chronic kidney disease, stage 2 (mild): Secondary | ICD-10-CM | POA: Diagnosis not present

## 2019-02-06 NOTE — Chronic Care Management (AMB) (Signed)
Chronic Care Management   Social Work General Note  02/06/2019 Name: [name] MRN: [id number] DOB: 12-03-1945  [name] is a 73 y.o. year old female who is a primary care patient of [name], MD. The CCM was consulted to assist the patient with disease management.  Ms. Klinker was given information about Chronic Care Management services today including:  1. CCM service includes personalized support from designated clinical staff supervised by her physician, including individualized plan of care and coordination with other care providers 2. 24/7 contact phone numbers for assistance for urgent and routine care needs. 3. Service will only be billed when office clinical staff spend 20 minutes or more in a month to coordinate care. 4. Only one practitioner may furnish and bill the service in a calendar month. 5. The patient may stop CCM services at any time (effective at the end of the month) by phone call to the office staff. 6. The patient will be responsible for cost sharing (co-pay) of up to 20% of the service fee (after annual deductible is met).  Patient agreed to services and verbal consent obtained.  It is noted the patient has a legal guardian who is her sister [name]. CCM SW spoke with both the patient and her guardian regarding enrollment. CCM SW obtained verbal consent from both the patient and her legal guardian prior to enrolling the patient in CCM services.  Review of patient status, including review of consultants reports, relevant laboratory and other test results, and collaboration with appropriate care team members and the patient's provider was performed as part of comprehensive patient evaluation and provision of chronic care management services.    SDOH (Social Determinants of Health) screening performed today. The patient does not identify any current SDOH challenges. The patient does express concern with having a "knot" in her stomach. See care  plan below:  Goals Addressed            This Visit's Progress     Patient Stated   . "I have a knot in my stomach" (pt-stated)       Current Barriers:  . Unknown  Clinical Social Work Clinical Goal(s):  Marland Kitchen Over the next 10 days the patient will follow up with her primary provider regarding reported concern.  CCM SW Interventions: Completed 02/06/2019 . Patient interviewed and appropriate assessments performed o The patient reports she was seen in the practice "one to two weeks ago" by her physician due to a painful knot in her stomach. The patient stated she was sent to have an x-ray of problem area "they said I was full of stool so I took magnesium citrate" . Assessed for patients ability to have a BM after administering laxative "I went. I don't have a gallbladder so I never have a problem going" . Determined the patient has noticed the knot being "less tender" but reports the knot is currently the size of a baseball . Performed chart review, determined the patient was referred for a CT scan of an umbilical hernia on 10/10/4079 . Collaboration with patients primary care provider via in basket message to report continued patient concern of having a knot in her stomach . Advised the patient to contact her physicians office to schedule an appointment if symptoms persist . Collaborated with RN Case Manager re: reported knot in the patients stomach . Determined RN Case Manager would place an outreach call to the patient to gather more information and perform a comprehensive telephonic assessment  Patient  Self Care Activities:  . Self administers medications as prescribed . Performs ADL's independently  Initial goal documentation         Follow Up Plan: SW will follow up with patient by phone over the next month to confirm no SW services are needed. The patient has been provided my direct number and is encouraged to call if assistance is needed prior to our next scheduled call.        Daneen Schick, BSW, CDP Social Worker, Certified Dementia Practitioner [place] [facility] [place] [facility] [phone]  Total time spent performing care coordination and/or care management activities with the patient by phone or face to face = 35 minutes.

## 2019-02-06 NOTE — Patient Instructions (Signed)
Social Worker Visit Information  Goals we discussed today:  Goals Addressed            This Visit's Progress     Patient Stated   . "I have a knot in my stomach" (pt-stated)       Current Barriers:  . Unknown  Clinical Social Work Clinical Goal(s):  Marland Kitchen Over the next 10 days the patient will follow up with her primary provider regarding reported concern.  CCM SW Interventions: Completed 02/06/2019 . Patient interviewed and appropriate assessments performed o The patient reports she was seen in the practice "one to two weeks ago" by her physician due to a painful knot in her stomach. The patient stated she was sent to have an x-ray of problem area "they said I was full of stool so I took magnesium citrate" . Assessed for patients ability to have a BM after administering laxative "I went. I don't have a gallbladder so I never have a problem going" . Determined the patient has noticed the knot being "less tender" but reports the knot is currently the size of a baseball . Performed chart review, determined the patient was referred for a CT scan of an umbilical hernia on 02/02/9146 . Collaboration with patients primary care provider via in basket message to report continued patient concern of having a knot in her stomach . Advised the patient to contact her physicians office to schedule an appointment if symptoms persist . Collaborated with RN Case Manager re: reported knot in the patients stomach . Determined RN Case Manager would place an outreach call to the patient to gather more information and perform a comprehensive telephonic assessment  Patient Self Care Activities:  . Self administers medications as prescribed . Performs ADL's independently  Initial goal documentation         Ms. [place]burg was given information about Chronic Care Management services today including:  1. CCM service includes personalized support from designated clinical staff supervised by her physician,  including individualized plan of care and coordination with other care providers 2. 24/7 contact phone numbers for assistance for urgent and routine care needs. 3. Service will only be billed when office clinical staff spend 20 minutes or more in a month to coordinate care. 4. Only one practitioner may furnish and bill the service in a calendar month. 5. The patient may stop CCM services at any time (effective at the end of the month) by phone call to the office staff. 6. The patient will be responsible for cost sharing (co-pay) of up to 20% of the service fee (after annual deductible is met).  Patient agreed to services and verbal consent obtained.   The patient verbalized understanding of instructions provided today and declined a print copy of patient instruction materials.   Follow up plan: SW will follow up with patient by phone over the next month.  Daneen Schick, BSW, CDP Social Worker, Certified Dementia Practitioner [place] [facility] [place] [phone]

## 2019-02-09 ENCOUNTER — Other Ambulatory Visit: Payer: Self-pay | Admitting: *Deleted

## 2019-02-09 ENCOUNTER — Other Ambulatory Visit: Payer: Self-pay

## 2019-02-09 DIAGNOSIS — M654 Radial styloid tenosynovitis [de Quervain]: Secondary | ICD-10-CM | POA: Diagnosis not present

## 2019-02-09 MED ORDER — ACCU-CHEK AVIVA PLUS W/DEVICE KIT: PACK | 3 refills | Status: DC

## 2019-02-09 MED ORDER — ACCU-CHEK AVIVA PLUS VI STRP: ORAL_STRIP | 3 refills | Status: DC

## 2019-02-09 MED ORDER — ACCU-CHEK SOFTCLIX LANCETS MISC: 3 refills | Status: DC

## 2019-02-09 MED ORDER — ACCU-CHEK AVIVA VI SOLN: 3 refills | Status: DC

## 2019-02-09 MED ORDER — ALCOHOL WIPES 70 % EX MISC: 1.0000 | Freq: Two times a day (BID) | CUTANEOUS | 3 refills | Status: DC

## 2019-02-09 NOTE — Chronic Care Management (AMB) (Signed)
Chronic Care Management   Initial Visit Note  02/09/2019 Name: ERRIKA NARVAIZ MRN: [id number] DOB: 1946-05-08  Referred by: [name], MD Reason for referral : Chronic Care Management ([facility] Out[facility] )   DAINA CARA is a 73 y.o. year old female who is a primary care patient of [name], MD. The CCM team was consulted for assistance with chronic disease management and care coordination needs.   Review of patient status, including review of consultants reports, relevant laboratory and other test results, and collaboration with appropriate care team members and the patient's provider was performed as part of comprehensive patient evaluation and provision of chronic care management services.    I spoke with Ms. Pagel by telephone today to assess for CCM RNCM needs and a care plan was established.   Medications: Outpatient Encounter Medications as of 02/06/2019  Medication Sig  . acetaminophen (TYLENOL) 500 MG tablet Take 500 mg by mouth every 4 (four) hours as needed (for pain).   Marland Kitchen albuterol (PROVENTIL HFA;VENTOLIN HFA) 108 (90 Base) MCG/ACT inhaler Inhale 1-2 puffs into the lungs every 6 (six) hours as needed for wheezing or shortness of breath (DOE WHEEZING SOB).  Marland Kitchen atorvastatin (LIPITOR) 10 MG tablet TAKE 1 TABLET EVERY DAY  . B Complex-C (B-COMPLEX WITH VITAMIN C) tablet Take 1 tablet by mouth daily.  . carvedilol (COREG) 25 MG tablet Take 1 tablet (25 mg total) by mouth 2 (two) times daily with a meal.  . Cholecalciferol (VITAMIN D-3) 1000 units CAPS Take 1,000 Units by mouth daily.  . diclofenac sodium (VOLTAREN) 1 % GEL Apply 2 g topically 4 (four) times daily as needed (pain).  . DROPLET PEN NEEDLES 31G X 8 MM MISC USE DAILY WITH VICTOZA AND/OR NOVOLOG.   . furosemide (LASIX) 40 MG tablet Take one tablet ('40mg'$ ) by mouth on Saturday and Sunday only.  . furosemide (LASIX) 80 MG tablet Take one tablet ('80mg'$ ) by mouth Monday through Friday only.  .  hydrALAZINE (APRESOLINE) 100 MG tablet Take 100 mg twice a day , if blood pressure is greater than 150 mmhg take an extra tablet .  Marland Kitchen HYDROcodone-acetaminophen (NORCO/VICODIN) 5-325 MG tablet Take 1 tablet by mouth every 6 (six) hours as needed for moderate pain. (Patient not taking: Reported on 01/21/2019)  . Insulin Glargine, 1 Unit Dial, (TOUJEO SOLOSTAR) 300 UNIT/ML SOPN Inject 36 Units into the skin at bedtime. (Patient taking differently: Inject 34 Units into the skin at bedtime. )  . isosorbide mononitrate (IMDUR) 60 MG 24 hr tablet Take 1 tablet (60 mg total) by mouth daily.  Marland Kitchen lamoTRIgine (LAMICTAL) 100 MG tablet Take 1 tablet by mouth twice daily  . Menthol, Topical Analgesic, 3.5 % GEL Apply 1 application topically as needed (for neuropathy in feet).  . metolazone (ZAROXOLYN) 2.5 MG tablet Take 1 tablet (2.5 mg total) by mouth See admin instructions. Take 1 tablet Monday and Friday only if weight is greater or equal to 269  . Multiple Vitamins-Minerals (MULTIVITAMIN WITH MINERALS) tablet Take 1 tablet by mouth daily.  Marland Kitchen OZEMPIC, 1 MG/DOSE, 2 MG/1.5ML SOPN INJECT '1MG'$  INTO  THE SKIN ONCE A WEEK  . Pancrelipase, Lip-Prot-Amyl, (ZENPEP) 25000-79000 units CPEP Take 1-2 capsules by mouth See admin instructions. Take 2 capsules with meals and 1 capsule with snacks  . potassium chloride SA (K-DUR) 20 MEQ tablet TAKE 2 TABLETS ONCE DAILY. BUT ON THE DAY YOU  TAKE METOLAZONE AN  EXTRA TABLET FOR A TOTAL OF 3 TABLETS (60MEQ)  .  sertraline (ZOLOFT) 50 MG tablet TAKE 1 TABLET EVERY DAY  . [DISCONTINUED] Blood Glucose Monitoring Suppl (ACCU-CHEK NANO SMARTVIEW) W/DEVICE KIT Use to check blood sugar 3 times per day dx code E11.65   No facility-administered encounter medications on file as of 02/06/2019.      Objective:  Lab Results  Component Value Date   HGBA1C 8.6 (H) 01/21/2019   HGBA1C 8.0 (H) 10/22/2018   HGBA1C 8.0 (H) 07/21/2018   Lab Results  Component Value Date   MICROALBUR 10  10/22/2018   LDLCALC 65 10/22/2018   CREATININE 0.92 01/21/2019   BP Readings from Last 3 Encounters:  01/21/19 112/64  01/19/19 140/68  12/23/18 (!) 148/76    Goals Addressed      Patient Stated   . "I have a knot in my stomach" (pt-stated)       Current Barriers:  . Unknown  Clinical Social Work Clinical Goal(s):  Marland Kitchen Over the next 10 days the patient will follow up with her primary provider regarding reported concern.  CCM SW Interventions: Completed 02/06/2019 . Patient interviewed and appropriate assessments performed o The patient reports she was seen in the practice "one to two weeks ago" by her physician due to a painful knot in her stomach. The patient stated she was sent to have an x-ray of problem area "they said I was full of stool so I took magnesium citrate" . Assessed for patients ability to have a BM after administering laxative "I went. I don't have a gallbladder so I never have a problem going" . Determined the patient has noticed the knot being "less tender" but reports the knot is currently the size of a baseball . Performed chart review, determined the patient was referred for a CT scan of an umbilical hernia on 6/81/1572 . Collaboration with patients primary care provider via in basket message to report continued patient concern of having a knot in her stomach . Advised the patient to contact her physicians office to schedule an appointment if symptoms persist . Collaborated with RN Case Manager re: reported knot in the patients stomach . Determined RN Case Manager would place an outreach call to the patient to gather more information and perform a comprehensive telephonic assessment  CCM RN CM Interventions: 02/06/19 call completed with patient  . Evaluation of current treatment plan related to umbilical hernia and patient's adherence to plan as established by provider. . Advised patient to seek medical attention right away for symptoms suggestive of  obstruction or strangulation of the intestine including, nausea, vomiting, severe abdominal pain and tenderness, fever and or worsening "bulging" of the hernia, or if unable to pass gas or have a BM; patient reports having mid umbilical abdominal tenderness only when being pressed on and reports having some random episodes of nausea over the past couple of days; instructed patient on how to access the on call provider for her PCP office if needed after hours/weekend; discussed calling 911 for a medical emergency - patient verbalizes understanding  . Collaborated with Dr. Baird Cancer regarding follow up and or recommendations for GI f/u - patient reports she is established with Gastroenterologist specialist, Dr. Arta Silence  . Discussed plans with patient for ongoing care management follow up and provided patient with direct contact information for care management team . Reviewed scheduled/upcoming provider appointments including: next OV scheduled with Dr. Baird Cancer on 04/06/19 '@2'$ :49 PM   Patient Self Care Activities:  . Self administers medications as prescribed . Performs ADL's independently  Initial goal  documentation     . "I have to have a procedure on my R hand Monday, 02/09/19" (pt-stated)       Current Barriers:  Marland Kitchen Knowledge Deficits related to treatment management for pain and decreased dexterity of her R hand . Decreased dexterity and ROM to R hand  Nurse Case Manager Clinical Goal(s):  Marland Kitchen Over the next 30 days, patient will report having complete wound healing and improved dexterity and ROM to her R hand . Over the next 60 days, patient have a better understanding of her long term treatment plan for PT/OT to help improve her dexterity and strength of her R hand  CCM RN CM Interventions:  02/06/19 call completed with patient   . Evaluation of current treatment plan related to treatment management and planned procedure to her R hand/thumb and patient's adherence to plan as established by  provider . Discussed patient has a planned outpatient procedure to be performed by Orthopedic MD, Dr. Amedeo Plenty, scheduled for 02/09/19; per review of OV with Dr. Amedeo Plenty , noted patient's dx: Osteoarthrosis of the Carpometacarpal Joint of the Thumb; Pain of Right Wrist; Radial Styloid Tenosynovitis . Patient states she was given basic information about the procedure but is unable to verbalize what to expect post procedure . Discussed plans with patient for ongoing care management follow up and provided patient with direct contact information for care management team  Patient Self Care Activities:  . Self administers medications as prescribed . Performs ADL's independently  Initial goal documentation     . "I would like to learn more about how to manage my diabetes" (pt-stated)       Current Barriers:  Marland Kitchen Knowledge Deficits related to disease process and Self Health Management of diabetes  Nurse Case Manager Clinical Goal(s):  Marland Kitchen Over the next 60 days, patient will work with the CCM team to address needs related to providing education and ADA recommendations to help lower her A1C and Meal planning using the plate method . Over the next 90 days patient will lower her A1C < 8.6   CCM RN CM Interventions:  02/06/19 call completed with patient   . Evaluation of current treatment plan related to diabetes disease management and patient's adherence to plan as established by provider. . Provided education to patient re: current A1C of 8.6; educated patient on target A1C of <7.0; discussed interventions to help achieve goal including adherence to a diabetic friendly diet, implementing exercise in her daily routine and adherence to taking her medications exactly as prescribed; patient agreeable to further reviewing at next call and completing a medication reconciliation . Discussed plans with patient for ongoing care management follow up and provided patient with direct contact information for care  management team . Provided patient with printed educational materials related to Meal Planning using the plate, Know Your A1C, s/s of hypo/hyperglycemia, Diabetes Zone Safety Tool  Patient Self Care Activities:  . Self administers medications as prescribed . Performs ADL's independently  Initial goal documentation       Other   . Assist with Chronic Care Management and Care Coordination needs       Current Barriers:  Marland Kitchen Knowledge Barriers related to resources and support available to address needs related to Chronic Care Management and Care Coordination needs  Case Manager Clinical Goal(s):  Marland Kitchen Over the next 30 days, patient will work with the CCM team to address needs related to Chronic Care Management and Care Coordination needs  Interventions:  . Collaborated with BSW  and initiated plan of care to address needs related to Chronic Care Management   Patient Self Care Activities:  . Self administers medications as prescribed . Performs ADL's independently  Initial goal documentation         Plan:   Telephone follow up appointment with care management team member scheduled for: 02/12/19  Barb Merino, RN, BSN, CCM Care Management Coordinator [place] [facility]  Direct Phone: [phone]

## 2019-02-09 NOTE — Patient Outreach (Signed)
[facility]) Care Management  02/09/2019  [name] 1946/04/16 [id number]   RN will close case. Patient is enrolled in embedded Chronic Care Management.  Plan: Case closure  [facility] [phone]

## 2019-02-09 NOTE — Patient Instructions (Signed)
Visit Information  Goals Addressed      Patient Stated   . "I have a knot in my stomach" (pt-stated)       Current Barriers:  . Unknown  Clinical Social Work Clinical Goal(s):  Marland Kitchen Over the next 10 days the patient will follow up with her primary provider regarding reported concern.  CCM SW Interventions: Completed 02/06/2019 . Patient interviewed and appropriate assessments performed o The patient reports she was seen in the practice "one to two weeks ago" by her physician due to a painful knot in her stomach. The patient stated she was sent to have an x-ray of problem area "they said I was full of stool so I took magnesium citrate" . Assessed for patients ability to have a BM after administering laxative "I went. I don't have a gallbladder so I never have a problem going" . Determined the patient has noticed the knot being "less tender" but reports the knot is currently the size of a baseball . Performed chart review, determined the patient was referred for a CT scan of an umbilical hernia on 1/61/0960 . Collaboration with patients primary care provider via in basket message to report continued patient concern of having a knot in her stomach . Advised the patient to contact her physicians office to schedule an appointment if symptoms persist . Collaborated with RN Case Manager re: reported knot in the patients stomach . Determined RN Case Manager would place an outreach call to the patient to gather more information and perform a comprehensive telephonic assessment  CCM RN CM Interventions: 02/06/19 call completed with patient  . Evaluation of current treatment plan related to umbilical hernia and patient's adherence to plan as established by provider. . Advised patient to seek medical attention right away for symptoms suggestive of obstruction or strangulation of the intestine including, nausea, vomiting, severe abdominal pain and tenderness, fever and or worsening "bulging" of the  hernia, or if unable to pass gas or have a BM; patient reports having mid umbilical abdominal tenderness only when being pressed on and reports having some random episodes of nausea over the past couple of days; instructed patient on how to access the on call provider for her PCP office if needed after hours/weekend; discussed calling 911 for a medical emergency - patient verbalizes understanding  . Collaborated with Dr. Baird Cancer regarding follow up and or recommendations for GI f/u - patient reports she is established with Gastroenterologist specialist, Dr. Arta Silence  . Discussed plans with patient for ongoing care management follow up and provided patient with direct contact information for care management team . Reviewed scheduled/upcoming provider appointments including: next OV scheduled with Dr. Baird Cancer on 04/06/19 @2 :21 PM   Patient Self Care Activities:  . Self administers medications as prescribed . Performs ADL's independently  Initial goal documentation     . "I have to have a procedure on my R hand Monday, 02/09/19" (pt-stated)       Current Barriers:  Marland Kitchen Knowledge Deficits related to treatment management for pain and decreased dexterity of her R hand . Decreased dexterity and ROM to R hand  Nurse Case Manager Clinical Goal(s):  Marland Kitchen Over the next 30 days, patient will report having complete wound healing and improved dexterity and ROM to her R hand . Over the next 60 days, patient have a better understanding of her long term treatment plan for PT/OT to help improve her dexterity and strength of her R hand  CCM RN CM Interventions:  02/06/19  call completed with patient   . Evaluation of current treatment plan related to treatment management and planned procedure to her R hand/thumb and patient's adherence to plan as established by provider . Discussed patient has a planned outpatient procedure to be performed by Orthopedic MD, Dr. Amedeo Plenty, scheduled for 02/09/19; per review of OV  with Dr. Amedeo Plenty , noted patient's dx: Osteoarthrosis of the Carpometacarpal Joint of the Thumb; Pain of Right Wrist; Radial Styloid Tenosynovitis . Patient states she was given basic information about the procedure but is unable to verbalize what to expect post procedure . Discussed plans with patient for ongoing care management follow up and provided patient with direct contact information for care management team  Patient Self Care Activities:  . Self administers medications as prescribed . Performs ADL's independently  Initial goal documentation     . "I would like to learn more about how to manage my diabetes" (pt-stated)       Current Barriers:  Marland Kitchen Knowledge Deficits related to disease process and Self Health Management of diabetes  Nurse Case Manager Clinical Goal(s):  Marland Kitchen Over the next 60 days, patient will work with the CCM team to address needs related to providing education and ADA recommendations to help lower her A1C and Meal planning using the plate method . Over the next 90 days patient will lower her A1C < 8.6   CCM RN CM Interventions:  02/06/19 call completed with patient   . Evaluation of current treatment plan related to diabetes disease management and patient's adherence to plan as established by provider. . Provided education to patient re: current A1C of 8.6; educated patient on target A1C of <7.0; discussed interventions to help achieve goal including adherence to a diabetic friendly diet, implementing exercise in her daily routine and adherence to taking her medications exactly as prescribed; patient agreeable to further reviewing at next call and completing a medication reconciliation . Discussed plans with patient for ongoing care management follow up and provided patient with direct contact information for care management team . Provided patient with printed educational materials related to Meal Planning using the plate, Know Your A1C, s/s of hypo/hyperglycemia,  Diabetes Zone Safety Tool  Patient Self Care Activities:  . Self administers medications as prescribed . Performs ADL's independently  Initial goal documentation       Other   . Assist with Chronic Care Management and Care Coordination needs       Current Barriers:  Marland Kitchen Knowledge Barriers related to resources and support available to address needs related to Chronic Care Management and Care Coordination needs  Case Manager Clinical Goal(s):  Marland Kitchen Over the next 30 days, patient will work with the CCM team to address needs related to Chronic Care Management and Care Coordination needs  Interventions:  . Collaborated with BSW and initiated plan of care to address needs related to Chronic Care Management   Patient Self Care Activities:  . Self administers medications as prescribed . Performs ADL's independently  Initial goal documentation        The patient verbalized understanding of instructions provided today and declined a print copy of patient instruction materials.   Telephone follow up appointment with care management team member scheduled for: 02/12/19  Barb Merino, RN, BSN, CCM Care Management Coordinator [place] [facility]  Direct Phone: [phone]

## 2019-02-12 ENCOUNTER — Telehealth: Payer: Self-pay

## 2019-02-16 ENCOUNTER — Ambulatory Visit: Payer: Self-pay | Admitting: Pharmacist

## 2019-02-16 DIAGNOSIS — I110 Hypertensive heart disease with heart failure: Secondary | ICD-10-CM

## 2019-02-16 DIAGNOSIS — E1122 Type 2 diabetes mellitus with diabetic chronic kidney disease: Secondary | ICD-10-CM

## 2019-02-16 DIAGNOSIS — I5032 Chronic diastolic (congestive) heart failure: Secondary | ICD-10-CM

## 2019-02-17 ENCOUNTER — Other Ambulatory Visit: Payer: Self-pay

## 2019-02-17 MED ORDER — ALCOHOL WIPES 70 % EX MISC: 1.0000 | Freq: Two times a day (BID) | CUTANEOUS | 3 refills | Status: DC

## 2019-02-17 MED ORDER — ACCU-CHEK SOFTCLIX LANCETS MISC: 3 refills | Status: DC

## 2019-02-17 MED ORDER — OZEMPIC (1 MG/DOSE) 2 MG/1.5ML ~~LOC~~ SOPN: 1.0000 mg | PEN_INJECTOR | SUBCUTANEOUS | 1 refills | Status: DC

## 2019-02-17 MED ORDER — ACCU-CHEK AVIVA VI SOLN: 3 refills | Status: DC

## 2019-02-17 MED ORDER — ACCU-CHEK AVIVA PLUS VI STRP: ORAL_STRIP | 3 refills | Status: DC

## 2019-02-23 ENCOUNTER — Ambulatory Visit: Payer: Self-pay | Admitting: Pharmacist

## 2019-02-23 ENCOUNTER — Telehealth: Payer: Self-pay

## 2019-02-23 DIAGNOSIS — I5032 Chronic diastolic (congestive) heart failure: Secondary | ICD-10-CM

## 2019-02-23 DIAGNOSIS — I110 Hypertensive heart disease with heart failure: Secondary | ICD-10-CM

## 2019-02-23 DIAGNOSIS — E1122 Type 2 diabetes mellitus with diabetic chronic kidney disease: Secondary | ICD-10-CM

## 2019-02-23 NOTE — Telephone Encounter (Signed)
I returned the pt's call and notified her that Dr. Baird Cancer said to drink ginger tea to help with the nausea.

## 2019-02-23 NOTE — Progress Notes (Signed)
Chronic Care Management   Visit Note  02/16/2019 Name: [name] MRN: [id number] DOB: 1946-05-07  Referred by: [name], MD Reason for referral : Chronic Care Management   [name] is a 73 y.o. year old female who is a primary care patient of [name], MD. The CCM team was consulted for assistance with chronic disease management and care coordination needs.   Review of patient status, including review of consultants reports, relevant laboratory and other test results, and collaboration with appropriate care team members and the patient's provider was performed as part of comprehensive patient evaluation and provision of chronic care management services.    I spoke with [name] by telephone today.  Medications: Outpatient Encounter Medications as of 02/16/2019  Medication Sig Note  . acetaminophen (TYLENOL) 500 MG tablet Take 500 mg by mouth every 4 (four) hours as needed (for pain).    Marland Kitchen albuterol (PROVENTIL HFA;VENTOLIN HFA) 108 (90 Base) MCG/ACT inhaler Inhale 1-2 puffs into the lungs every 6 (six) hours as needed for wheezing or shortness of breath (DOE WHEEZING SOB).   Marland Kitchen atorvastatin (LIPITOR) 10 MG tablet TAKE 1 TABLET EVERY DAY   . B Complex-C (B-COMPLEX WITH VITAMIN C) tablet Take 1 tablet by mouth daily.   . Blood Glucose Monitoring Suppl (ACCU-CHEK AVIVA PLUS) w/Device KIT Use as directed to check blood sugars 2 times per day dx: e11.22   . carvedilol (COREG) 25 MG tablet Take 1 tablet (25 mg total) by mouth 2 (two) times daily with a meal.   . Cholecalciferol (VITAMIN D-3) 1000 units CAPS Take 1,000 Units by mouth daily.   . diclofenac sodium (VOLTAREN) 1 % GEL Apply 2 g topically 4 (four) times daily as needed (pain).   . DROPLET PEN NEEDLES 31G X 8 MM MISC USE DAILY WITH VICTOZA AND/OR NOVOLOG.    . furosemide (LASIX) 40 MG tablet Take one tablet (72m) by mouth on Saturday and Sunday only.   . furosemide (LASIX) 80 MG tablet Take one tablet  (8107m by mouth Monday through Friday only.   . hydrALAZINE (APRESOLINE) 100 MG tablet Take 100 mg twice a day , if blood pressure is greater than 150 mmhg take an extra tablet .   . Marland KitchenYDROcodone-acetaminophen (NORCO/VICODIN) 5-325 MG tablet Take 1 tablet by mouth every 6 (six) hours as needed for moderate pain.   . Insulin Glargine, 1 Unit Dial, (TOUJEO SOLOSTAR) 300 UNIT/ML SOPN Inject 36 Units into the skin at bedtime. (Patient taking differently: Inject 34 Units into the skin at bedtime. )   . isosorbide mononitrate (IMDUR) 60 MG 24 hr tablet Take 1 tablet (60 mg total) by mouth daily.   . Marland KitchenamoTRIgine (LAMICTAL) 100 MG tablet Take 1 tablet by mouth twice daily   . Menthol, Topical Analgesic, 3.5 % GEL Apply 1 application topically as needed (for neuropathy in feet).   . metolazone (ZAROXOLYN) 2.5 MG tablet Take 1 tablet (2.5 mg total) by mouth See admin instructions. Take 1 tablet Monday and Friday only if weight is greater or equal to 269 02/16/2019: Patient has not needed in awhile  . Multiple Vitamins-Minerals (MULTIVITAMIN WITH MINERALS) tablet Take 1 tablet by mouth daily.   . Pancrelipase, Lip-Prot-Amyl, (ZENPEP) 25000-79000 units CPEP Take 1-2 capsules by mouth See admin instructions. Take 2 capsules with meals and 1 capsule with snacks   . potassium chloride SA (K-DUR) 20 MEQ tablet TAKE 2 TABLETS ONCE DAILY. BUT ON THE DAY YOU  TAKE METOLAZONE AN  EXTRA  TABLET FOR A TOTAL OF 3 TABLETS (60MEQ)   . sertraline (ZOLOFT) 50 MG tablet TAKE 1 TABLET EVERY DAY   . [DISCONTINUED] Accu-Chek Softclix Lancets lancets Use as instructed to check blood sugars 2 times per day dx: e11.22   . [DISCONTINUED] Blood Glucose Calibration (ACCU-CHEK AVIVA) SOLN Use as directed dx: e11.22   . [DISCONTINUED] glucose blood (ACCU-CHEK AVIVA PLUS) test strip Use as instructed to check blood sugars 2 times per day dx: e11.22   . [DISCONTINUED] OZEMPIC, 1 MG/DOSE, 2 MG/1.5ML SOPN INJECT 1MG INTO  THE SKIN ONCE A WEEK    . [DISCONTINUED] Isopropyl Alcohol (ALCOHOL WIPES) 70 % MISC Apply 1 each topically 2 (two) times daily.    No facility-administered encounter medications on file as of 02/16/2019.      Objective:   Goals Addressed            This Visit's Progress     Patient Stated   . I would like to manage and optimize my medications for my chronic conditions (pt-stated)       Current Barriers:  . Diabetes: T2DM; most recent A1c 8.6% on 01/21/19 (was 8.0% on 10/22/18) patient motivated to decrease A1c back to goal . Current antihyperglycemic regimen: Ozempic . denies hypoglycemic symptoms; denies hyperglycemic symptoms . Current meal patterns: . Current exercise: n/a; motivated patient to participate in exercise as able and as recommended by PCP . Current blood glucose readings: FBG 150-190 . Cardiovascular risk reduction: o Current hypertensive regimen: carvedilol, hydralazine, isosorbide o Current hyperlipidemia regimen:  atorvastatin 23m (last filled #90 on 12/24/18)  Pharmacist Clinical Goal(s):  .Marland KitchenOver the next 90 days, patient with work with PharmD and primary care provider to address needs related to optimization of medication management of chronic conditions (diabetes).  Interventions: . Comprehensive medication review performed, medication list updated in electronic medical record.  Reviewed medication fill history via insurance claims data confirming patient appears compliant with having her medications filled on time as prescribed by provider. . Reviewed & discussed the following diabetes-related information with patient: o Continue checking blood sugars as directed o Follow ADA recommended "diabetes-friendly" diet  (reviewed healthy snack/food options) o Discussed GLP-1 injection technique; Patient uses AccuCheck glucometer o Reviewed medication purpose/side effects-->patient denies adverse events o Continue taking all medications as prescribed by provider .   Patient Self Care  Activities:  . Patient will check blood glucose daily , document, and provide at future appointments . Patient will focus on medication adherence by continuing to take medications as prescribed. . Patient will take medications as prescribed . Patient will contact provider with any episodes of hypoglycemia . Patient will report any questions or concerns to provider   Initial goal documentation       Plan:   The care management team will reach out to the patient again over the next 3-4 weeks.  JRegina Eck PharmD, BCPS Clinical Pharmacist, [facility] [place] [phone]

## 2019-02-23 NOTE — Patient Instructions (Signed)
Visit Information  Goals Addressed            This Visit's Progress     Patient Stated   . I would like to manage and optimize my medications for my chronic conditions (pt-stated)       Current Barriers:  . Diabetes: T2DM; most recent A1c 8.6% on 01/21/19 (was 8.0% on 10/22/18) patient motivated to decrease A1c back to goal . Current antihyperglycemic regimen: Ozempic . denies hypoglycemic symptoms; denies hyperglycemic symptoms . Current meal patterns: . Current exercise: n/a; motivated patient to participate in exercise as able and as recommended by PCP . Current blood glucose readings: FBG 150-190 . Cardiovascular risk reduction: o Current hypertensive regimen: carvedilol, hydralazine, isosorbide o Current hyperlipidemia regimen:  atorvastatin 10mg  (last filled #90 on 12/24/18)  Pharmacist Clinical Goal(s):  Marland Kitchen Over the next 90 days, patient with work with PharmD and primary care provider to address needs related to optimization of medication management of chronic conditions (diabetes).  Interventions: . Comprehensive medication review performed, medication list updated in electronic medical record.  Reviewed medication fill history via insurance claims data confirming patient appears compliant with having her medications filled on time as prescribed by provider. . Reviewed & discussed the following diabetes-related information with patient: o Continue checking blood sugars as directed o Follow ADA recommended "diabetes-friendly" diet  (reviewed healthy snack/food options) o Discussed GLP-1 injection technique; Patient uses AccuCheck glucometer o Reviewed medication purpose/side effects-->patient denies adverse events o Continue taking all medications as prescribed by provider .   Patient Self Care Activities:  . Patient will check blood glucose daily , document, and provide at future appointments . Patient will focus on medication adherence by continuing to take medications as  prescribed. . Patient will take medications as prescribed . Patient will contact provider with any episodes of hypoglycemia . Patient will report any questions or concerns to provider   Initial goal documentation        The patient verbalized understanding of instructions provided today and declined a print copy of patient instruction materials.   The care management team will reach out to the patient again over the next 3-4 weeks.  [name], PharmD, BCPS Clinical Pharmacist, [facility] [place]: [phone]

## 2019-02-24 ENCOUNTER — Telehealth: Payer: Self-pay

## 2019-02-24 ENCOUNTER — Observation Stay (HOSPITAL_COMMUNITY): Payer: Medicare HMO

## 2019-02-24 ENCOUNTER — Observation Stay (HOSPITAL_COMMUNITY)
Admission: EM | Admit: 2019-02-24 | Discharge: 2019-02-26 | Disposition: A | Discharge status: Home / Self Care | Payer: Medicare HMO | Attending: Internal Medicine | Admitting: Internal Medicine

## 2019-02-24 ENCOUNTER — Emergency Department (HOSPITAL_COMMUNITY): Payer: Medicare HMO

## 2019-02-24 ENCOUNTER — Other Ambulatory Visit: Payer: Self-pay

## 2019-02-24 ENCOUNTER — Encounter (HOSPITAL_COMMUNITY): Payer: Self-pay | Admitting: Emergency Medicine

## 2019-02-24 DIAGNOSIS — I5032 Chronic diastolic (congestive) heart failure: Secondary | ICD-10-CM | POA: Diagnosis not present

## 2019-02-24 DIAGNOSIS — I10 Essential (primary) hypertension: Secondary | ICD-10-CM | POA: Diagnosis not present

## 2019-02-24 DIAGNOSIS — N183 Chronic kidney disease, stage 3 unspecified: Secondary | ICD-10-CM | POA: Diagnosis present

## 2019-02-24 DIAGNOSIS — E1122 Type 2 diabetes mellitus with diabetic chronic kidney disease: Secondary | ICD-10-CM

## 2019-02-24 DIAGNOSIS — G4733 Obstructive sleep apnea (adult) (pediatric): Secondary | ICD-10-CM | POA: Diagnosis not present

## 2019-02-24 DIAGNOSIS — I110 Hypertensive heart disease with heart failure: Secondary | ICD-10-CM | POA: Diagnosis not present

## 2019-02-24 DIAGNOSIS — Z20828 Contact with and (suspected) exposure to other viral communicable diseases: Secondary | ICD-10-CM | POA: Insufficient documentation

## 2019-02-24 DIAGNOSIS — Z79899 Other long term (current) drug therapy: Secondary | ICD-10-CM | POA: Diagnosis not present

## 2019-02-24 DIAGNOSIS — Z87891 Personal history of nicotine dependence: Secondary | ICD-10-CM | POA: Insufficient documentation

## 2019-02-24 DIAGNOSIS — G40009 Localization-related (focal) (partial) idiopathic epilepsy and epileptic syndromes with seizures of localized onset, not intractable, without status epilepticus: Secondary | ICD-10-CM

## 2019-02-24 DIAGNOSIS — F418 Other specified anxiety disorders: Secondary | ICD-10-CM | POA: Insufficient documentation

## 2019-02-24 DIAGNOSIS — R471 Dysarthria and anarthria: Secondary | ICD-10-CM | POA: Diagnosis not present

## 2019-02-24 DIAGNOSIS — R51 Headache: Secondary | ICD-10-CM | POA: Diagnosis not present

## 2019-02-24 DIAGNOSIS — E1165 Type 2 diabetes mellitus with hyperglycemia: Secondary | ICD-10-CM | POA: Diagnosis present

## 2019-02-24 DIAGNOSIS — R4182 Altered mental status, unspecified: Secondary | ICD-10-CM | POA: Diagnosis not present

## 2019-02-24 DIAGNOSIS — F339 Major depressive disorder, recurrent, unspecified: Secondary | ICD-10-CM | POA: Diagnosis not present

## 2019-02-24 DIAGNOSIS — E662 Morbid (severe) obesity with alveolar hypoventilation: Secondary | ICD-10-CM | POA: Diagnosis present

## 2019-02-24 DIAGNOSIS — E119 Type 2 diabetes mellitus without complications: Secondary | ICD-10-CM | POA: Diagnosis present

## 2019-02-24 DIAGNOSIS — G40909 Epilepsy, unspecified, not intractable, without status epilepticus: Secondary | ICD-10-CM | POA: Diagnosis not present

## 2019-02-24 DIAGNOSIS — R29810 Facial weakness: Secondary | ICD-10-CM | POA: Diagnosis not present

## 2019-02-24 DIAGNOSIS — N182 Chronic kidney disease, stage 2 (mild): Secondary | ICD-10-CM | POA: Diagnosis not present

## 2019-02-24 DIAGNOSIS — Z9989 Dependence on other enabling machines and devices: Secondary | ICD-10-CM | POA: Diagnosis present

## 2019-02-24 DIAGNOSIS — R29818 Other symptoms and signs involving the nervous system: Secondary | ICD-10-CM | POA: Diagnosis not present

## 2019-02-24 DIAGNOSIS — Z794 Long term (current) use of insulin: Secondary | ICD-10-CM | POA: Insufficient documentation

## 2019-02-24 DIAGNOSIS — R079 Chest pain, unspecified: Secondary | ICD-10-CM

## 2019-02-24 DIAGNOSIS — R531 Weakness: Secondary | ICD-10-CM | POA: Diagnosis not present

## 2019-02-24 DIAGNOSIS — R2689 Other abnormalities of gait and mobility: Secondary | ICD-10-CM | POA: Insufficient documentation

## 2019-02-24 DIAGNOSIS — K861 Other chronic pancreatitis: Secondary | ICD-10-CM | POA: Diagnosis present

## 2019-02-24 DIAGNOSIS — R55 Syncope and collapse: Principal | ICD-10-CM | POA: Diagnosis present

## 2019-02-24 DIAGNOSIS — I130 Hypertensive heart and chronic kidney disease with heart failure and stage 1 through stage 4 chronic kidney disease, or unspecified chronic kidney disease: Secondary | ICD-10-CM | POA: Insufficient documentation

## 2019-02-24 DIAGNOSIS — R4701 Aphasia: Secondary | ICD-10-CM | POA: Diagnosis not present

## 2019-02-24 DIAGNOSIS — R410 Disorientation, unspecified: Secondary | ICD-10-CM | POA: Diagnosis not present

## 2019-02-24 DIAGNOSIS — E785 Hyperlipidemia, unspecified: Secondary | ICD-10-CM | POA: Diagnosis present

## 2019-02-24 LAB — DIFFERENTIAL
Abs Immature Granulocytes: 0.02 10*3/uL (ref 0.00–0.07)
Basophils Absolute: 0 10*3/uL (ref 0.0–0.1)
Basophils Relative: 0 %
Eosinophils Absolute: 0.1 10*3/uL (ref 0.0–0.5)
Eosinophils Relative: 3 %
Immature Granulocytes: 0 %
Lymphocytes Relative: 34 %
Lymphs Abs: 1.6 10*3/uL (ref 0.7–4.0)
Monocytes Absolute: 0.5 10*3/uL (ref 0.1–1.0)
Monocytes Relative: 11 %
Neutro Abs: 2.6 10*3/uL (ref 1.7–7.7)
Neutrophils Relative %: 52 %

## 2019-02-24 LAB — CBC
HCT: 37.2 % (ref 36.0–46.0)
Hemoglobin: 11.8 g/dL — ABNORMAL LOW (ref 12.0–15.0)
MCH: 23.6 pg — ABNORMAL LOW (ref 26.0–34.0)
MCHC: 31.7 g/dL (ref 30.0–36.0)
MCV: 74.4 fL — ABNORMAL LOW (ref 80.0–100.0)
Platelets: 283 10*3/uL (ref 150–400)
RBC: 5 MIL/uL (ref 3.87–5.11)
RDW: 15.8 % — ABNORMAL HIGH (ref 11.5–15.5)
WBC: 4.9 10*3/uL (ref 4.0–10.5)
nRBC: 0 % (ref 0.0–0.2)

## 2019-02-24 LAB — COMPREHENSIVE METABOLIC PANEL
ALT: 22 U/L (ref 0–44)
AST: 35 U/L (ref 15–41)
Albumin: 3.6 g/dL (ref 3.5–5.0)
Alkaline Phosphatase: 87 U/L (ref 38–126)
Anion gap: 12 (ref 5–15)
BUN: 18 mg/dL (ref 8–23)
CO2: 26 mmol/L (ref 22–32)
Calcium: 9.4 mg/dL (ref 8.9–10.3)
Chloride: 96 mmol/L — ABNORMAL LOW (ref 98–111)
Creatinine, Ser: 1.15 mg/dL — ABNORMAL HIGH (ref 0.44–1.00)
GFR calc Af Amer: 56 mL/min — ABNORMAL LOW (ref >60)
GFR calc non Af Amer: 48 mL/min — ABNORMAL LOW (ref >60)
Glucose, Bld: 135 mg/dL — ABNORMAL HIGH (ref 70–99)
Potassium: 4 mmol/L (ref 3.5–5.1)
Sodium: 134 mmol/L — ABNORMAL LOW (ref 135–145)
Total Bilirubin: 0.9 mg/dL (ref 0.3–1.2)
Total Protein: 7.5 g/dL (ref 6.5–8.1)

## 2019-02-24 LAB — I-STAT CHEM 8, ED
BUN: 24 mg/dL — ABNORMAL HIGH (ref 8–23)
Calcium, Ion: 1.14 mmol/L — ABNORMAL LOW (ref 1.15–1.40)
Chloride: 96 mmol/L — ABNORMAL LOW (ref 98–111)
Creatinine, Ser: 1.1 mg/dL — ABNORMAL HIGH (ref 0.44–1.00)
Glucose, Bld: 132 mg/dL — ABNORMAL HIGH (ref 70–99)
HCT: 39 % (ref 36.0–46.0)
Hemoglobin: 13.3 g/dL (ref 12.0–15.0)
Potassium: 3.8 mmol/L (ref 3.5–5.1)
Sodium: 135 mmol/L (ref 135–145)
TCO2: 32 mmol/L (ref 22–32)

## 2019-02-24 LAB — PROTIME-INR
INR: 1.1 (ref 0.8–1.2)
Prothrombin Time: 14.5 seconds (ref 11.4–15.2)

## 2019-02-24 LAB — SARS CORONAVIRUS 2 (TAT 6-24 HRS): SARS Coronavirus 2: NEGATIVE

## 2019-02-24 LAB — TROPONIN I (HIGH SENSITIVITY)
Troponin I (High Sensitivity): 10 ng/L (ref <18)
Troponin I (High Sensitivity): 11 ng/L (ref <18)
Troponin I (High Sensitivity): 11 ng/L (ref <18)

## 2019-02-24 LAB — CBG MONITORING, ED: Glucose-Capillary: 125 mg/dL — ABNORMAL HIGH (ref 70–99)

## 2019-02-24 LAB — HEMOGLOBIN A1C
Hgb A1c MFr Bld: 8.5 % — ABNORMAL HIGH (ref 4.8–5.6)
Mean Plasma Glucose: 197.25 mg/dL

## 2019-02-24 LAB — APTT: aPTT: 32 seconds (ref 24–36)

## 2019-02-24 MED ORDER — SODIUM CHLORIDE 0.9% FLUSH
3.0000 mL | Freq: Two times a day (BID) | INTRAVENOUS | Status: DC
  Administered 2019-02-25 (×3): 3 mL via INTRAVENOUS

## 2019-02-24 MED ORDER — SODIUM CHLORIDE 0.9% FLUSH: 3.0000 mL | Freq: Once | INTRAVENOUS | Status: DC

## 2019-02-24 MED ORDER — B COMPLEX-C PO TABS
1.0000 | ORAL_TABLET | Freq: Every day | ORAL | Status: DC
  Administered 2019-02-25 – 2019-02-26 (×2): 1 via ORAL
  Filled 2019-02-24 (×2): qty 1

## 2019-02-24 MED ORDER — VITAMIN D 25 MCG (1000 UNIT) PO TABS
1000.0000 [IU] | ORAL_TABLET | Freq: Every day | ORAL | Status: DC
  Administered 2019-02-25 – 2019-02-26 (×2): 1000 [IU] via ORAL
  Filled 2019-02-24 (×2): qty 1

## 2019-02-24 MED ORDER — BISACODYL 10 MG RE SUPP: 10.0000 mg | Freq: Every day | RECTAL | Status: DC | PRN

## 2019-02-24 MED ORDER — ALBUTEROL SULFATE (2.5 MG/3ML) 0.083% IN NEBU: 2.5000 mg | INHALATION_SOLUTION | Freq: Four times a day (QID) | RESPIRATORY_TRACT | Status: DC | PRN

## 2019-02-24 MED ORDER — LAMOTRIGINE 100 MG PO TABS: 100.0000 mg | ORAL_TABLET | Freq: Two times a day (BID) | ORAL | Status: DC

## 2019-02-24 MED ORDER — PANCRELIPASE (LIP-PROT-AMYL) 12000-38000 UNITS PO CPEP
48000.0000 [IU] | ORAL_CAPSULE | Freq: Three times a day (TID) | ORAL | Status: DC
  Administered 2019-02-25 – 2019-02-26 (×4): 48000 [IU] via ORAL
  Filled 2019-02-24 (×4): qty 4

## 2019-02-24 MED ORDER — HYDRALAZINE HCL 50 MG PO TABS
100.0000 mg | ORAL_TABLET | Freq: Two times a day (BID) | ORAL | Status: DC
  Administered 2019-02-25 – 2019-02-26 (×4): 100 mg via ORAL
  Filled 2019-02-24 (×4): qty 2

## 2019-02-24 MED ORDER — HYDROCODONE-ACETAMINOPHEN 5-325 MG PO TABS: 1.0000 | ORAL_TABLET | Freq: Four times a day (QID) | ORAL | Status: DC | PRN

## 2019-02-24 MED ORDER — PANCRELIPASE (LIP-PROT-AMYL) 12000-38000 UNITS PO CPEP: 24000.0000 [IU] | ORAL_CAPSULE | Freq: Two times a day (BID) | ORAL | Status: DC | PRN

## 2019-02-24 MED ORDER — IOHEXOL 350 MG/ML SOLN
100.0000 mL | Freq: Once | INTRAVENOUS | Status: AC | PRN
  Administered 2019-02-24: 100 mL via INTRAVENOUS

## 2019-02-24 MED ORDER — LAMOTRIGINE 25 MG PO TABS
125.0000 mg | ORAL_TABLET | Freq: Two times a day (BID) | ORAL | Status: DC
  Administered 2019-02-25 – 2019-02-26 (×4): 125 mg via ORAL
  Filled 2019-02-24 (×5): qty 1

## 2019-02-24 MED ORDER — ALUM & MAG HYDROXIDE-SIMETH 200-200-20 MG/5ML PO SUSP: 30.0000 mL | Freq: Four times a day (QID) | ORAL | Status: DC | PRN

## 2019-02-24 MED ORDER — HEPARIN SODIUM (PORCINE) 5000 UNIT/ML IJ SOLN
5000.0000 [IU] | Freq: Three times a day (TID) | INTRAMUSCULAR | Status: DC
  Administered 2019-02-25 – 2019-02-26 (×5): 5000 [IU] via SUBCUTANEOUS
  Filled 2019-02-24 (×5): qty 1

## 2019-02-24 MED ORDER — POLYETHYLENE GLYCOL 3350 17 G PO PACK: 17.0000 g | PACK | Freq: Every day | ORAL | Status: DC | PRN

## 2019-02-24 MED ORDER — ONDANSETRON HCL 4 MG PO TABS: 4.0000 mg | ORAL_TABLET | Freq: Four times a day (QID) | ORAL | Status: DC | PRN

## 2019-02-24 MED ORDER — SODIUM CHLORIDE 0.9 % IV SOLN: INTRAVENOUS | Status: DC

## 2019-02-24 MED ORDER — INSULIN GLARGINE 100 UNIT/ML ~~LOC~~ SOLN
34.0000 [IU] | Freq: Every day | SUBCUTANEOUS | Status: DC
  Administered 2019-02-25: 34 [IU] via SUBCUTANEOUS
  Filled 2019-02-24 (×2): qty 0.34

## 2019-02-24 MED ORDER — ACETAMINOPHEN 500 MG PO TABS
500.0000 mg | ORAL_TABLET | ORAL | Status: DC | PRN
  Administered 2019-02-25: 500 mg via ORAL
  Filled 2019-02-24: qty 1

## 2019-02-24 MED ORDER — ISOSORBIDE MONONITRATE ER 60 MG PO TB24
60.0000 mg | ORAL_TABLET | Freq: Every day | ORAL | Status: DC
  Administered 2019-02-25 – 2019-02-26 (×2): 60 mg via ORAL
  Filled 2019-02-24 (×2): qty 1

## 2019-02-24 MED ORDER — ONDANSETRON HCL 4 MG/2ML IJ SOLN: 4.0000 mg | Freq: Four times a day (QID) | INTRAMUSCULAR | Status: DC | PRN

## 2019-02-24 MED ORDER — ATORVASTATIN CALCIUM 10 MG PO TABS
10.0000 mg | ORAL_TABLET | Freq: Every day | ORAL | Status: DC
  Administered 2019-02-25: 10 mg via ORAL
  Filled 2019-02-24: qty 1

## 2019-02-24 MED ORDER — SERTRALINE HCL 50 MG PO TABS
50.0000 mg | ORAL_TABLET | Freq: Every day | ORAL | Status: DC
  Administered 2019-02-25 – 2019-02-26 (×3): 50 mg via ORAL
  Filled 2019-02-24 (×3): qty 1

## 2019-02-24 MED ORDER — ADULT MULTIVITAMIN W/MINERALS CH
1.0000 | ORAL_TABLET | Freq: Every day | ORAL | Status: DC
  Administered 2019-02-25 – 2019-02-26 (×2): 1 via ORAL
  Filled 2019-02-24 (×2): qty 1

## 2019-02-24 MED ORDER — POTASSIUM CHLORIDE CRYS ER 20 MEQ PO TBCR: 40.0000 meq | EXTENDED_RELEASE_TABLET | Freq: Every day | ORAL | Status: DC

## 2019-02-24 NOTE — Consult Note (Signed)
Cardiology Consultation:   Patient ID: DANNAH RYLES; [id number]; [birth year]   Admit date: 02/24/2019 Date of Consult: 02/24/2019  Primary Care Provider: [name], MD Primary Cardiologist: Glenetta Hew, MD 01/21/2019 Primary Electrophysiologist:  None   Patient Profile:   DESTINAE NEUBECKER is a 73 y.o. female with a hx of non-obs CAD at cath 2019, low risk MV 2017, DM, HTN, abnl LFTs, CKD III, COPD, morbid obesity, OHS/OSA>>needs CPAP, D-CHF w/ dry wt approximately 230 lbs, CVA, Sz, chronic pancreatitis, venous insuff w/ varicose veins  who is being seen today for the evaluation of syncope at the request of Dr Alfredia Ferguson.  History of Present Illness:   Ms. Way was last seen by Dr Ellyn Hack 01/21/2019. Her volume was good, on Lasix 80 mg daily except 40 mg qd on weekends. She was losing weight, using CPAP, wt 232 lbs.  Pt was admitted today as a code Stroke, w/ MS changes and facial droop, but CT not acute, ?syncope>>cards asked to see.   Ms. Deshotels was in her usual state of health when she got up this morning.  Her weight was 227 pounds.  She was pleased with this.  She was feeling well.  She walked this morning.  She ate breakfast and took her medications as usual.  Her blood sugar was not unusually high or low.  Her blood pressure was 834 systolic, heart rate 19Q  She had only folded a few pieces of laundry, when she had onset of nausea that seem to start in her lower abdomen and go up.  She then felt a little lightheaded.  When she woke up, EMS and family members were there.  She may have felt a little short of breath, but did not have chest pain at that time.  She felt her heart may have been beating fast but it seemed regular.  When she woke up, she still felt a little lightheaded.  She was groggy, not really aware of who and where she was or what was going on.  She kept rubbing her left leg.  She does not remember any acute injuries from the fall, but says she then started having  pain in her right leg.  She is not exactly sure when, but at some point during this she got chest pain.  It was the pain that she has been having off and on for years.  It is pressure, in the middle of her chest.  It has not been exertional at any point.  It is the same pain she was having prior to her last heart catheterization.  It resolved on its own.  Currently, she does not feel any palpitations, no nausea or shortness of breath.  She feels perfectly well.  Upon reading ER notes, no seizure activity is described.   Past Medical History:  Diagnosis Date   Abnormal liver function     in the past.   Anemia    Arthritis    all over   Bruises easily    Cataract    right eye;immature   Chronic back pain    stenosis   Chronic cough    Chronic kidney disease    COPD (chronic obstructive pulmonary disease) ([place])    Demand myocardial infarction ([place]) 2012   Demand Infarction in setting of Pancreatitis --> mild Troponin elevation, NON-OBSTRUCTIVE CAD   Depression    takes Abilify daily as well as Zoloft   Diastolic heart failure 2229   Grade 1 diastolic Dysfunction by Echo  Diverticulosis    DM (diabetes mellitus) ([place])    takes Victoza daily as well as Lantus and Humalog   Empty sella ([place])    on MRI in 2009.   Glaucoma    Headache(784.0)    last migraine-4-80yr ago   History of blood transfusion    no abnormal reaction noted   History of colon polyps    benign   HTN (hypertension)    takes Benicar,Imdur,and Bystolic daily   Hyperlipidemia    takes Lipitor daily   Joint swelling    Nocturia    Obesity hypoventilation syndrome ([facility]    Obstructive sleep apnea 02/2018   Notably improved split-night study with weight loss from 270 (2017) down to 250 pounds (2019).   Pancreatitis    takes Pancrelipase daily   Parkinson's disease ([facility]    takes Sinemet daily   Peripheral neuropathy    Pneumonia 2012   Seizures ([facility]    takes Lamictal  daily and Primidone nightly;last seizure 2wks ago   Urinary urgency    With increased frequency   Varicose veins of both lower extremities with pain    With edema.  Takes daily Lasix    Past Surgical History:  Procedure Laterality Date   ABDOMINAL HYSTERECTOMY     APPENDECTOMY     BACK SURGERY     CARDIAC CATHETERIZATION  2009/March 2012   2009: (Dr. KAlvie Heidelberg Nonobstructive CAD; 2012: Minimal CAD --> false positive stress test   Cardiac Event Monitor  September-October 2017   Sinus rhythm with occasional PACs and artifact. No arrhythmias besides one short run of tachycardia.   CHOLECYSTECTOMY     COLONOSCOPY N/A 08/15/2012   Procedure: COLONOSCOPY;  Surgeon: WArta Silence MD;  Location: [facility];  Service: Endoscopy;  Laterality: N/A;   COLONOSCOPY WITH PROPOFOL Left 06/17/2017   Procedure: COLONOSCOPY WITH PROPOFOL;  Surgeon: KRonnette Juniper MD;  [facility]  Service: Gastroenterology;  Laterality: Left;   CORONARY CALCIUM SCORE AND CTA  04/2018   Coronary calcium score 71.9.  Very large, hyperdynamic LAD wrapping apex giving rise to PDA.  Moderate LAD-diagonal and circumflex plaque -> CT FFR suggested positive findings in distal D1, D2 and distal circumflex.  Referred for cath. ==> FALSE POSITIVE   ESOPHAGOGASTRODUODENOSCOPY     eye cysts Bilateral    LASIK     LEFT HEART CATH AND CORONARY ANGIOGRAPHY N/A 05/16/2018   Procedure: LEFT HEART CATH AND CORONARY ANGIOGRAPHY;  Surgeon: SBelva Crome MD;  [facility]:  Angiographically normal coronary arteries with LVEDP of 21 mmHg.  Large draping hyperdominant LAD that wraps the apex and provides distal half of the PDA.  Relatively small caliber distal Cx -->proxLPDA, non-dom RCA. No Cx or Diag lesions -- FALSE + CT FFR   NM MYOVIEW LTD  01/2016; 01/2018   a)LOW RISK. No ischemia or infarction;; b) EF 55-60%. NO ST changes.  No ischemia or Infarction. NO significant RV enlargement.  LOW RISK.     Polysomnogram  02/2018   (Dr. DBrett Fairyfrom neurology): Split study was not adequately done due to low AHI.  She slept reclined and had an AHI less than 10, prolonged hypoxemia and no hypercapnia. -->  She has home oxygen already prescribed, but did not meet criteria for nightly oxygen.  (Was noted that the patient did not cooperate well with study.  Titration was discussed.   RECTAL POLYPECTOMY     TONSILLECTOMY     TRANSTHORACIC ECHOCARDIOGRAM  01/2016; 05/2017  A) Normal LV chamber size with mod LVH pattern. EF 50-55%. Severe LA dilation. Mod RA dilation. PAP elevated at 38 mmHg;; B) Mild concentric LVH.  EF 55-60% & no RWMA.  Grade 2 DD.  Diastolic flattening of the ventricular septum == ? elevated PAP.  Mild aortic valve calcification/sclerosis.  Mod LA dilation.  Mild RV dilation & Mod RA dilation   TRANSTHORACIC ECHOCARDIOGRAM  10/2017; 01/2018   a) EF 55-60%.  No RWMA,  Moderate LA dilation.  Mild LA dilation.  Peak PA pressures ~57 mmHg (moderate);;; b) EF 55-60%.  GRII DD.  Suggestion of RV volume overload with moderate RV dilation.  Severe LA and RA dilation.Marland Kitchen  PA pressure ~67%.   TUBAL LIGATION     vaginal cyst removed     several times     Prior to Admission medications   Medication Sig Start Date End Date Taking? Authorizing Provider  Accu-Chek Softclix Lancets lancets Use as instructed to check blood sugars 2 times per day dx: e11.22 02/17/19  Yes [name], MD  acetaminophen (TYLENOL) 500 MG tablet Take 500 mg by mouth every 4 (four) hours as needed (for pain).    Yes [provider]  albuterol (PROVENTIL HFA;VENTOLIN HFA) 108 (90 Base) MCG/ACT inhaler Inhale 1-2 puffs into the lungs every 6 (six) hours as needed for wheezing or shortness of breath (DOE WHEEZING SOB). 05/27/18  Yes Lendon Colonel, NP  atorvastatin (LIPITOR) 10 MG tablet TAKE 1 TABLET EVERY DAY Patient taking differently: Take 10 mg by mouth daily.  10/01/18  Yes [name], MD  B  Complex-C (B-COMPLEX WITH VITAMIN C) tablet Take 1 tablet by mouth daily.   Yes [provider]  Blood Glucose Calibration (ACCU-CHEK AVIVA) SOLN Use as directed dx: e11.22 02/17/19  Yes [name], MD  carvedilol (COREG) 25 MG tablet Take 1 tablet (25 mg total) by mouth 2 (two) times daily with a meal. 12/29/18  Yes Leonie Man, MD  Cholecalciferol (VITAMIN D-3) 1000 units CAPS Take 1,000 Units by mouth daily.   Yes [provider]  diclofenac sodium (VOLTAREN) 1 % GEL Apply 2 g topically 4 (four) times daily as needed (pain). 05/17/18  Yes Vann, Jessica U, DO  DROPLET PEN NEEDLES 31G X 8 MM MISC USE DAILY WITH VICTOZA AND/OR NOVOLOG.  04/29/18  Yes [name], MD  furosemide (LASIX) 40 MG tablet Take one tablet (7m) by mouth on Saturday and Sunday only. Patient taking differently: Take 40 mg by mouth See admin instructions. Take one tablet (427m by mouth on Saturday and Sunday only. 08/19/18  Yes HaLeonie ManMD  furosemide (LASIX) 80 MG tablet Take one tablet (8043mby mouth Monday through Friday only. Patient taking differently: Take 80 mg by mouth See admin instructions. Take one tablet (31m77my mouth Monday through Friday only. 08/19/18  Yes HardLeonie Man  glucose blood (ACCU-CHEK AVIVA PLUS) test strip Use as instructed to check blood sugars 2 times per day dx: e11.22 02/17/19  Yes SandGlendale Chard  hydrALAZINE (APRESOLINE) 100 MG tablet Take 100 mg twice a day , if blood pressure is greater than 150 mmhg take an extra tablet . Patient taking differently: Take 100 mg by mouth daily as needed. Take 100 mg twice a day as needed if blood pressure is greater than 150 mmhg take an extra tablet . 01/19/19  Yes HardLeonie Man  Insulin Glargine, 1 Unit Dial, (TOUJEO SOLOSTAR) 300 UNIT/ML SOPN Inject 36 Units  into the skin at bedtime. Patient taking differently: Inject 34 Units into the skin at bedtime.  11/06/18  Yes [name], MD  Isopropyl Alcohol  (ALCOHOL WIPES) 70 % MISC Apply 1 each topically 2 (two) times daily. 02/17/19  Yes [name], MD  isosorbide mononitrate (IMDUR) 60 MG 24 hr tablet Take 1 tablet (60 mg total) by mouth daily. 08/19/18  Yes Leonie Man, MD  lamoTRIgine (LAMICTAL) 100 MG tablet Take 1 tablet by mouth twice daily 12/29/18  Yes Cameron Sprang, MD  Multiple Vitamins-Minerals (MULTIVITAMIN WITH MINERALS) tablet Take 1 tablet by mouth daily.   Yes [provider]  Pancrelipase, Lip-Prot-Amyl, (ZENPEP) 25000-79000 units CPEP Take 1-2 capsules by mouth See admin instructions. Take 2 capsules with meals and 1 capsule with snacks   Yes [provider]  potassium chloride SA (K-DUR) 20 MEQ tablet TAKE 2 TABLETS ONCE DAILY. BUT ON THE DAY YOU  TAKE METOLAZONE AN  EXTRA TABLET FOR A TOTAL OF 3 TABLETS (60MEQ) Patient taking differently: Take 40 mEq by mouth 2 (two) times daily.  10/24/18  Yes Leonie Man, MD  Semaglutide, 1 MG/DOSE, (OZEMPIC, 1 MG/DOSE,) 2 MG/1.5ML SOPN Inject 1 mg into the skin once a week. 02/17/19  Yes [name], MD  sertraline (ZOLOFT) 50 MG tablet TAKE 1 TABLET EVERY DAY Patient taking differently: Take 50 mg by mouth at bedtime.  07/03/18  Yes [name], MD  Blood Glucose Monitoring Suppl (ACCU-CHEK AVIVA PLUS) w/Device KIT Use as directed to check blood sugars 2 times per day dx: e11.22 02/09/19   [name], MD    Inpatient Medications: Scheduled Meds:  sodium chloride flush  3 mL Intravenous Once   Continuous Infusions:  PRN Meds:   Allergies:    Allergies  Allergen Reactions   Other Anaphylaxis and Rash    Bleach   Penicillins Hives    Has patient had a PCN reaction causing immediate rash, facial/tongue/throat swelling, SOB or lightheadedness with hypotension: Yes Has patient had a PCN reaction causing severe rash involving mucus membranes or skin necrosis: No Has patient had a PCN reaction that required hospitalization: No Has patient had a PCN  reaction occurring within the last 10 years: No If all of the above answers are "NO", then may proceed with Cephalosporin use.    Sulfa Antibiotics Hives   Aspirin Other (See Comments)     On aspirin 81 mg - Rectal bleeding in dec 2018   Codeine     Headache and makes the patient feel "off"    Social History:   Social History   Socioeconomic History   Marital status: Divorced    Spouse name: Not on file   Number of children: Not on file   Years of education: Not on file   Highest education level: Not on file  Occupational History   Occupation: Disabled    Comment: CNA  Social Designer, fashion/clothing strain: Not hard at all   Food insecurity    Worry: Never true    Inability: Never true   Transportation needs    Medical: No    Non-medical: No  Tobacco Use   Smoking status: Former Smoker    Packs/day: 0.50    Years: 25.00    Pack years: 12.50    Types: Cigarettes   Smokeless tobacco: Never Used   Tobacco comment: quit smoking 20+ytrs ago  Substance and Sexual Activity   Alcohol use: No   Drug use: No  Sexual activity: Not Currently    Birth control/protection: Surgical  Lifestyle   Physical activity    Days per week: 2 days    Minutes per session: 30 min   Stress: Not at all  Relationships   Social connections    Talks on phone: Not on file    Gets together: Not on file    Attends religious service: Not on file    Active member of club or organization: Not on file    Attends meetings of clubs or organizations: Not on file    Relationship status: Not on file   Intimate partner violence    Fear of current or ex partner: No    Emotionally abused: No    Physically abused: No    Forced sexual activity: No  Other Topics Concern   Not on file  Social History Narrative   She lives in [place]. She has lots of family in the area and is accompanied by her sister.   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    She has a distant history of smoking, quit 20 years ago.    Family History:   Family History  Problem Relation Age of Onset   Allergies Father    Heart disease Father        before 87   Heart failure Father    Hypertension Father    Hyperlipidemia Father    Heart disease Mother    Diabetes Mother    Hypertension Mother    Hyperlipidemia Mother    Hypertension Sister    Heart disease Sister        before 32   Hyperlipidemia Sister    Diabetes Brother    Hypertension Brother    Diabetes Sister    Hypertension Sister    Diabetes Son    Hypertension Son    Cancer Other    Family Status:  Family Status  Relation Name Status   Father  Deceased   Mother  Deceased   Sister  Alive   Brother  Deceased   Sister  Deceased   Son [place]   Other  (Not Specified)    ROS:  Please see the history of present illness.  All other ROS reviewed and negative.     Physical Exam/Data:   Vitals:   02/24/19 1530 02/24/19 1545 02/24/19 1556 02/24/19 1600  BP: (!) 131/99 (!) 148/72  134/70  Pulse:    66  Resp: (!) _0 Temp:   98.5 F (36.9 C)   TempSrc:   Oral   SpO2:    93%  Weight:       No intake or output data in the 24 hours ending 02/24/19 1732 Filed Weights   02/24/19 1400  Weight: 103.3 kg   Body mass index is 43.03 kg/m.  General:  Well nourished, well developed, female in no acute distress HEENT: normal Lymph: no adenopathy Neck: JVD -not elevated Endocrine:  No thryomegaly Vascular: No carotid bruits; 4/4 extremity pulses 2+, without bruits  Cardiac:  normal S1, S2; RRR; 2/6 murmur  Lungs:  clear bilaterally, no wheezing, rhonchi or rales  Abd: soft, nontender, no hepatomegaly  Ext: no edema Musculoskeletal:  No deformities, BUE and BLE strength normal and equal Skin: warm and dry  Neuro:  CNs 2-12 intact, no focal abnormalities noted Psych:  Normal affect   EKG:  The EKG was personally  reviewed  and demonstrates: Sinus rhythm with PACs, heart rate 75, possible RVH Telemetry:  Telemetry was personally reviewed and demonstrates: Sinus rhythm, PACs   CV studies:   ECHO: 02/11/2018 - Left ventricle: The cavity size was normal. Systolic function was   normal. The estimated ejection fraction was in the range of 55%   to 60%. Wall motion was normal; there were no regional wall   motion abnormalities. Features are consistent with a pseudonormal   left ventricular filling pattern, with concomitant abnormal   relaxation and increased filling pressure (grade 2 diastolic   dysfunction). - Ventricular septum: The contour showed systolic flattening. These   changes are consistent with RV pressure overload. - Left atrium: The atrium was severely dilated. - Right ventricle: The cavity size was moderately dilated. Wall   thickness was normal. - Right atrium: The atrium was severely dilated. - Atrial septum: No defect or patent foramen ovale was identified. - Pulmonary arteries: Systolic pressure was moderately increased.   PA peak pressure: 67 mm Hg (S).   CATH: 05/16/2018  Normal coronary arteries without evidence of obstructive disease.  Upper normal LV systolic function, EF 32%.  EDP 21 mmHg.  Findings compatible with diastolic heart failure.  RECOMMENDATIONS:   Management per treating team.  No indication for antiplatelet therapy at this time.   Laboratory Data:   Chemistry Recent Labs  Lab 02/24/19 1423 02/24/19 1428  NA 134* 135  K 4.0 3.8  CL 96* 96*  CO2 26  --   GLUCOSE 135* 132*  BUN 18 24*  CREATININE 1.15* 1.10*  CALCIUM 9.4  --   GFRNONAA 48*  --   GFRAA 56*  --   ANIONGAP 12  --     Lab Results  Component Value Date   ALT 22 02/24/2019   AST 35 02/24/2019   ALKPHOS 87 02/24/2019   BILITOT 0.9 02/24/2019   Hematology Recent Labs  Lab 02/24/19 1423 02/24/19 1428  WBC 4.9  --   RBC 5.00  --   HGB 11.8* 13.3  HCT 37.2 39.0  MCV  74.4*  --   MCH 23.6*  --   MCHC 31.7  --   RDW 15.8*  --   PLT 283  --    Cardiac Enzymes High Sensitivity Troponin:   Recent Labs  Lab 02/24/19 1606  TROPONINIHS 10     TSH:  Lab Results  Component Value Date   TSH 2.13 06/10/2014   Lipids: Lab Results  Component Value Date   CHOL 145 10/22/2018   HDL 70 10/22/2018   LDLCALC 65 10/22/2018   LDLDIRECT 66.0 01/27/2015   TRIG 52 10/22/2018   CHOLHDL 2.1 10/22/2018   HgbA1c: Lab Results  Component Value Date   HGBA1C 8.6 (H) 01/21/2019   Magnesium:  Magnesium  Date Value Ref Range Status  10/25/2017 1.7 1.7 - 2.4 mg/dL Final    Comment:    Performed at [facility], [name] [address] [place], [place] [address]     Radiology/Studies:  Ct Angio Head W Or Wo Contrast  Result Date: 02/24/2019 CLINICAL DATA:  Left facial droop, aphasia EXAM: CT ANGIOGRAPHY HEAD AND NECK TECHNIQUE: Multidetector CT imaging of the head and neck was performed using the standard protocol during bolus administration of intravenous contrast. Multiplanar CT image reconstructions and MIPs were obtained to evaluate the vascular anatomy. Carotid stenosis measurements (when applicable) are obtained utilizing NASCET criteria, using the distal internal carotid diameter as the denominator. CONTRAST:  1103m OMNIPAQUE  IOHEXOL 350 MG/ML SOLN COMPARISON:  Noncontrast head CT performed earlier the same day 02/24/2019, brain MRI 06/15/2017 FINDINGS: CTA NECK FINDINGS Aortic arch: Standard branching. Atherosclerotic calcification within the imaged aortic arch and major branch vessels. No high-grade narrowing of the major branch vessel origins. There is prominence of the main (3.4 cm in diameter) and right pulmonary artery, and findings are suggestive of pulmonary hypertension. Right carotid system: The right common and cervical internal carotid arteries are patent without significant stenosis (50% or greater. Mild atherosclerotic calcification at the carotid  bifurcation. Partially retropharyngeal course of the right internal carotid artery. Left carotid system: The left common and cervical internal carotid arteries are patent without significant stenosis (50% or greater). Mild atherosclerotic calcification at the carotid bifurcation. Vertebral arteries: The bilateral vertebral arteries are patent within the neck without significant stenosis (50% or greater) Skeleton: No acute bony abnormality. Cervical spondylosis without high-grade bony spinal canal stenosis. Prominent multilevel ventral osteophytes. Other neck: No soft tissue neck mass or pathologically enlarged cervical chain lymph nodes. Upper chest: Centrilobular and paraseptal emphysema within the imaged lung apices. Review of the MIP images confirms the above findings CTA HEAD FINDINGS Anterior circulation: The bilateral intracranial internal carotid arteries are patent without significant stenosis. Partially azygos configuration of the anterior cerebral arteries, arising from a dominant A1 left anterior cerebral artery, without significant proximal stenosis. The bilateral middle cerebral arteries are patent without significant proximal stenosis. No intracranial aneurysm is identified. Posterior circulation: The intracranial vertebral arteries are patent without significant stenosis. The distal right vertebral artery is significantly non dominant. The basilar artery is patent without significant stenosis. The bilateral posterior cerebral arteries are patent without significant proximal stenosis. Venous sinuses: Within the limitations of contrast timing, no evidence of thrombosis. Anatomic variants: A small left posterior communicating artery is seen. The right posterior communicating artery is poorly delineated and may be hypoplastic or absent. Partially azygos configuration of the anterior cerebral arteries as described Review of the MIP images confirms the above findings These results were communicated to Dr.  Leonel Ramsay at 3:29 pmon 9/1/2020by text page via the [facility] messaging system. IMPRESSION: CTA head: No large vessel occlusion or proximal high-grade arterial stenosis. CTA neck: Atherosclerotic disease as described. The bilateral common carotid, cervical internal carotid and vertebral arteries are patent within the neck without significant stenosis (50% or greater). Prominence of the main and right pulmonary artery suggestive of pulmonary hypertension. Pulmonary emphysema. Electronically Signed   By: [name]   On: 02/24/2019 15:30   Ct Angio Neck W Or Wo Contrast  Result Date: 02/24/2019 CLINICAL DATA:  Left facial droop, aphasia EXAM: CT ANGIOGRAPHY HEAD AND NECK TECHNIQUE: Multidetector CT imaging of the head and neck was performed using the standard protocol during bolus administration of intravenous contrast. Multiplanar CT image reconstructions and MIPs were obtained to evaluate the vascular anatomy. Carotid stenosis measurements (when applicable) are obtained utilizing NASCET criteria, using the distal internal carotid diameter as the denominator. CONTRAST:  176m OMNIPAQUE IOHEXOL 350 MG/ML SOLN COMPARISON:  Noncontrast head CT performed earlier the same day 02/24/2019, brain MRI 06/15/2017 FINDINGS: CTA NECK FINDINGS Aortic arch: Standard branching. Atherosclerotic calcification within the imaged aortic arch and major branch vessels. No high-grade narrowing of the major branch vessel origins. There is prominence of the main (3.4 cm in diameter) and right pulmonary artery, and findings are suggestive of pulmonary hypertension. Right carotid system: The right common and cervical internal carotid arteries are patent without significant stenosis (50% or greater. Mild atherosclerotic  calcification at the carotid bifurcation. Partially retropharyngeal course of the right internal carotid artery. Left carotid system: The left common and cervical internal carotid arteries are patent without significant  stenosis (50% or greater). Mild atherosclerotic calcification at the carotid bifurcation. Vertebral arteries: The bilateral vertebral arteries are patent within the neck without significant stenosis (50% or greater) Skeleton: No acute bony abnormality. Cervical spondylosis without high-grade bony spinal canal stenosis. Prominent multilevel ventral osteophytes. Other neck: No soft tissue neck mass or pathologically enlarged cervical chain lymph nodes. Upper chest: Centrilobular and paraseptal emphysema within the imaged lung apices. Review of the MIP images confirms the above findings CTA HEAD FINDINGS Anterior circulation: The bilateral intracranial internal carotid arteries are patent without significant stenosis. Partially azygos configuration of the anterior cerebral arteries, arising from a dominant A1 left anterior cerebral artery, without significant proximal stenosis. The bilateral middle cerebral arteries are patent without significant proximal stenosis. No intracranial aneurysm is identified. Posterior circulation: The intracranial vertebral arteries are patent without significant stenosis. The distal right vertebral artery is significantly non dominant. The basilar artery is patent without significant stenosis. The bilateral posterior cerebral arteries are patent without significant proximal stenosis. Venous sinuses: Within the limitations of contrast timing, no evidence of thrombosis. Anatomic variants: A small left posterior communicating artery is seen. The right posterior communicating artery is poorly delineated and may be hypoplastic or absent. Partially azygos configuration of the anterior cerebral arteries as described Review of the MIP images confirms the above findings These results were communicated to Dr. Leonel Ramsay at 3:29 pmon 9/1/2020by text page via the [facility] messaging system. IMPRESSION: CTA head: No large vessel occlusion or proximal high-grade arterial stenosis. CTA neck:  Atherosclerotic disease as described. The bilateral common carotid, cervical internal carotid and vertebral arteries are patent within the neck without significant stenosis (50% or greater). Prominence of the main and right pulmonary artery suggestive of pulmonary hypertension. Pulmonary emphysema. Electronically Signed   By: [name]   On: 02/24/2019 15:30   Dg Chest Portable 1 View  Result Date: 02/24/2019 CLINICAL DATA:  Chest pain. EXAM: PORTABLE CHEST 1 VIEW COMPARISON:  May 15, 2018. FINDINGS: The heart size remains enlarged. There is no pneumothorax. There is generalized volume overload. There is atelectasis at the lung bases. No significant pleural effusion. No acute osseous abnormality. The pulmonary arteries appear significantly enlarged. IMPRESSION: 1. No acute cardiopulmonary process. 2. Cardiomegaly. 3. Significant enlargement of the pulmonary arteries which can be seen in patients with elevated pulmonary artery pressures. Electronically Signed   By: Constance Holster M.D.   On: 02/24/2019 16:34   Ct Head Code Stroke Wo Contrast  Result Date: 02/24/2019 CLINICAL DATA:  Code stroke. Focal neuro deficit less than 6 hours. Possible stroke. Left facial droop. EXAM: CT HEAD WITHOUT CONTRAST TECHNIQUE: Contiguous axial images were obtained from the base of the skull through the vertex without intravenous contrast. COMPARISON:  CT head 06/24/2017 FINDINGS: Brain: Ventricle size and cerebral volume normal. Chronic infarct right parietal cortex. Mild chronic ischemia in the white matter unchanged. Negative for acute infarct, hemorrhage, mass.  No midline shift. Vascular: Negative for hyperdense vessel Skull: Negative Sinuses/Orbits: Paranasal sinuses clear.  Bilateral cataract surgery Other: None ASPECTS ([place] Stroke Program Early CT Score) - Ganglionic level infarction (caudate, lentiform nuclei, internal capsule, insula, M1-M3 cortex): 7 - Supraganglionic infarction (M4-M6 cortex): 3 Total  score (0-10 with 10 being normal): 10 IMPRESSION: 1. No acute abnormality 2. ASPECTS is 10 3. These results were called by telephone at  the time of interpretation on 02/24/2019 at 2:33 pm to Dr. Leonel Ramsay , who verbally acknowledged these results. Electronically Signed   By: Franchot Gallo M.D.   On: 02/24/2019 14:33    Assessment and Plan:   1.  Syncope: - not obviously cardiac due to episode being prolonged and ?Postictal state upon waking - She has been seen by Neurology, but the diagnosis is not clear - If no neurologic cause is found for her symptoms, consider event monitor as an outpatient and follow-up with Dr. Ellyn Hack in 6-8 weeks. -Agree with repeating her echo  2.  Chronic diastolic CHF: - Continue current meds, she is doing extremely well from a CHF standpoint.  3.  Chest pain: - She had a cardiac catheterization less than a year ago that showed no obstructive disease. -ECG is nonacute - Initial at bedtime troponin is negative - Unless cardiac enzymes become significantly elevated, no ischemic evaluation planned  Otherwise, per IM and Neurology Active Problems:   Syncope    For questions or updates, please contact [place] Please consult [email or web address] for contact info under Cardiology/STEMI.   Jonetta Speak, PA-C  02/24/2019 5:32 PM

## 2019-02-24 NOTE — Consult Note (Addendum)
Neurology Consultation Reason for Consult: Loss of consciousness Referring Physician: Billy Fischer, E  CC: Loss of consciousness  History is obtained from: Patient, sister  HPI: [name] is a 73 y.o. female with a history of myocardial infarction and seizures who was in her normal state of health until this afternoon around 130 when she was washing clothes.  He suddenly was seen the kind of stumbled against a wall and then subsequently slumped to the ground.  She did lose consciousness.  Her sister reports that it took her about 6 minutes to regain some degree of consciousness.  She did not start talking until EMS arrived a few minutes later.  She currently is mostly back to her baseline.  A code stroke was activated in route, given her initial inability to speak, but she only had an NIH of 1 (for left facial droop) and therefore no stroke treatment was pursued.   LKW: 1:30 PM tpa given?: no, mild, resolving symptoms   ROS: A 14 point ROS was performed and is negative except as noted in the HPI.   Past Medical History:  Diagnosis Date  . Abnormal liver function     in the past.  . Anemia   . Arthritis    all over  . Bruises easily   . Cataract    right eye;immature  . Chronic back pain    stenosis  . Chronic cough   . Chronic kidney disease   . COPD (chronic obstructive pulmonary disease) ([place])   . Demand myocardial infarction [facility]) 2012   Demand Infarction in setting of Pancreatitis --> mild Troponin elevation, NON-OBSTRUCTIVE CAD  . Depression    takes Abilify daily as well as Zoloft  . Diastolic heart failure [id number]   Grade 1 diastolic Dysfunction by Echo   . Diverticulosis   . DM (diabetes mellitus) ([place])    takes Victoza daily as well as Lantus and Humalog  . Empty sella ([place])    on MRI in 2009.  . Glaucoma   . Headache(784.0)    last migraine-4-44yrs ago  . History of blood transfusion    no abnormal reaction noted  . History of colon polyps    benign  . HTN  (hypertension)    takes [employer] daily  . Hyperlipidemia    takes Lipitor daily  . Joint swelling   . Nocturia   . Obesity hypoventilation syndrome ([place])   . Obstructive sleep apnea 02/2018   Notably improved split-night study with weight loss from 270 (2017) down to 250 pounds (2019).  . Pancreatitis    takes Pancrelipase daily  . Parkinson's disease ([place])    takes Sinemet daily  . Peripheral neuropathy   . Pneumonia 2012  . Seizures ([place])    takes Lamictal daily and Primidone nightly;last seizure 2wks ago  . Urinary urgency    With increased frequency  . Varicose veins of both lower extremities with pain    With edema.  Takes daily Lasix     Family History  Problem Relation Age of Onset  . Allergies Father   . Heart disease Father        before 45  . Heart failure Father   . Hypertension Father   . Hyperlipidemia Father   . Heart disease Mother   . Diabetes Mother   . Hypertension Mother   . Hyperlipidemia Mother   . Hypertension Sister   . Heart disease Sister        before 110  .  Hyperlipidemia Sister   . Diabetes Brother   . Hypertension Brother   . Diabetes Sister   . Hypertension Sister   . Diabetes Son   . Hypertension Son   . Cancer Other      Social History:  reports that she has quit smoking. Her smoking use included cigarettes. She has a 12.50 pack-year smoking history. She has never used smokeless tobacco. She reports that she does not drink alcohol or use drugs.   Exam: Current vital signs: BP (!) 143/79   Resp 17   Wt 103.3 kg   BMI 43.03 kg/m  Vital signs in last 24 hours: Resp:  [17] 17 (09/01 1515) BP: (143)/(79) 143/79 (09/01 1515) Weight:  [103.3 kg] 103.3 kg (09/01 1400)   Physical Exam  Constitutional: Appears well-developed and well-nourished.  Psych: Affect appropriate to situation Eyes: No scleral injection HENT: No OP obstrucion Head: Normocephalic.  Cardiovascular: Normal rate and regular rhythm.   Respiratory: Effort normal, non-labored breathing GI: Soft.  No distension. There is no tenderness.  Skin: WDI  Neuro: Mental Status: Patient is awake, alert, oriented to person, place, month, year, and situation. Patient is able to give a clear and coherent history. No signs of aphasia or neglect Cranial Nerves: II: Visual Fields are full. Pupils are equal, round, and reactive to light.   III,IV, VI: EOMI without ptosis or diploplia.  V: Facial sensation is symmetric to temperature VII: Facial movement with left facial droop VIII: hearing is intact to voice X: Uvula elevates symmetrically XI: Shoulder shrug is symmetric. XII: tongue is midline without atrophy or fasciculations.  Motor: Tone is normal. Bulk is normal. 5/5 strength was present in all four extremities.  Sensory: Sensation is symmetric to light touch and temperature in the arms and legs. Cerebellar: FNF without ataxia      I have reviewed labs in epic and the results pertinent to this consultation are: Creatinine 1.15  I have reviewed the images obtained: CT/CTA-no acute findings  Impression: 73 year old female with a history of seizures who presented with a 6-minute period of unresponsiveness followed by postictal state.  This seems more consistent with breakthrough seizure, though there was no motor manifestation.  I would favor increasing her lamotrigine dose slightly.  With some concern of chest pain, I do agree with cardiac work-up as well including troponin and echo.  With new focal facial droop, I would favor MRI also.  Recommendations: 1) increase lamotrigine to 125 twice daily. 2) lamotrigine level 3) MRI brain  Roland Rack, MD [facility] [phone]  If 7pm- 7am, please page neurology on call as listed in [place].

## 2019-02-24 NOTE — ED Provider Notes (Signed)
[facility] EMERGENCY DEPARTMENT Provider Note   CSN: [id number] Arrival date & time: 02/24/19  1418     History   Chief Complaint Chief Complaint  Patient presents with   Code Stroke    HPI [name] is a 73 y.o. female.     HPI   73yo female with a history of diabetes, COPD, MI, CVA, hypertension, hyperlipidemia, seizures, presents with concern for 4 dysarthria as a code stroke.  Patient was doing laundry, and without warning had a syncopal event.  Sister reports that her daughter had caught her, no history of trauma.  Reports that she had complete loss of consciousness.  Did not describe seizure-like activity.  After she woke, she had slurred speech.  Patient reports that she temporarily had overall blurred vision, but no diplopia no visual field changes.  Denies numbness or weakness on one side or the other.  Sister did not note any facial droop. Patient reports feeling improved now.  Initial hx with unclear LOC but sister reports she did witness this.  Reports has had some recent dyspnea and nausea.  Cough when removing her CPAP daily, no acute changes. No fever. Reports brief episode of CP en route to hospital but does not have on arrival.  Past Medical History:  Diagnosis Date   Abnormal liver function     in the past.   Anemia    Arthritis    all over   Bruises easily    Cataract    right eye;immature   Chronic back pain    stenosis   Chronic cough    Chronic kidney disease    COPD (chronic obstructive pulmonary disease) ([place])    Demand myocardial infarction ([place]) 2012   Demand Infarction in setting of Pancreatitis --> mild Troponin elevation, NON-OBSTRUCTIVE CAD   Depression    takes Abilify daily as well as Zoloft   Diastolic heart failure 0998   Grade 1 diastolic Dysfunction by Echo    Diverticulosis    DM (diabetes mellitus) ([place])    takes Victoza daily as well as Lantus and Humalog   Empty sella ([place])    on MRI in  2009.   Glaucoma    Headache(784.0)    last migraine-4-7yr ago   History of blood transfusion    no abnormal reaction noted   History of colon polyps    benign   HTN (hypertension)    takes Benicar,Imdur,and Bystolic daily   Hyperlipidemia    takes Lipitor daily   Joint swelling    Nocturia    Obesity hypoventilation syndrome ([place]    Obstructive sleep apnea 02/2018   Notably improved split-night study with weight loss from 270 (2017) down to 250 pounds (2019).   Pancreatitis    takes Pancrelipase daily   Parkinson's disease ([place]    takes Sinemet daily   Peripheral neuropathy    Pneumonia 2012   Seizures ([place]    takes Lamictal daily and Primidone nightly;last seizure 2wks ago   Urinary urgency    With increased frequency   Varicose veins of both lower extremities with pain    With edema.  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 ([place]) 04/24/2018   Excessive daytime sleepiness 01/30/2018   Sleep-related hypoventilation 01/30/2018   Recurrent depression ([place]) 11/06/2017   OSA (obstructive sleep apnea) - not on CPAP 10/07/2017   Todd's paralysis (HCC)    Chronic diastolic CHF (congestive heart failure), NYHA class 2 (HCC)    Chronic respiratory failure with hypoxia (HCC)    Acute lower GI bleeding    Stroke-like symptoms 06/15/2017   Rectal bleeding 06/15/2017   Dehydration 03/07/2017   Medication management 12/24/2016   Pre-syncope 03/09/2016   Racing heart beat 03/09/2016   Spinal stenosis at L4-L5 level 10/04/2014   Lower abdominal pain 10/31/2012     Type II diabetes mellitus, uncontrolled ([place]) 10/31/2012   Seizure disorder ([place]) 10/31/2012   Diverticulosis 10/31/2012   Anemia associated with acute blood loss 08/14/2012   Hematochezia 08/14/2012   Varicose veins of bilateral lower extremities with other complications 33/29/5188   Leg pain 03/21/2012   Radicular pain of left lower extremity 03/21/2012   Infected pseudocyst of pancreas 10/28/2011   Chronic pancreatitis ([place]) 10/28/2011   Hypertensive heart disease with chronic diastolic congestive heart failure ([place]) 10/28/2011   Dyslipidemia, goal LDL below 100 10/28/2011   Morbid obesity with BMI of 45.0-49.9, adult ([place]) 10/28/2011   Peripheral edema 04/14/2011   Obesity hypoventilation syndrome ([place]) 10/26/2010   Atherosclerotic heart disease of native coronary artery with other forms of angina pectoris ([place]) 08/28/2010    Past Surgical History:  Procedure Laterality Date   ABDOMINAL HYSTERECTOMY     APPENDECTOMY     BACK SURGERY     CARDIAC CATHETERIZATION  2009/March 2012   2009: (Dr. Alvie Heidelberg) Nonobstructive CAD; 2012: Minimal CAD --> false positive stress test   Cardiac Event Monitor  September-October 2017   Sinus rhythm with occasional PACs and artifact. No arrhythmias besides one short run of tachycardia.   CHOLECYSTECTOMY     COLONOSCOPY N/A 08/15/2012   Procedure: COLONOSCOPY;  Surgeon: Arta Silence, MD;  Location: [facility];  Service: Endoscopy;  Laterality: N/A;   COLONOSCOPY WITH PROPOFOL Left 06/17/2017   Procedure: COLONOSCOPY WITH PROPOFOL;  Surgeon: [name], MD;  Location: [facility];  Service: Gastroenterology;  Laterality: Left;   CORONARY CALCIUM SCORE AND CTA  04/2018   Coronary calcium score 71.9.  Very large, hyperdynamic LAD wrapping apex giving rise to PDA.  Moderate LAD-diagonal and circumflex plaque -> CT FFR suggested positive findings in distal D1, D2 and distal circumflex.  Referred for cath. ==> FALSE POSITIVE    ESOPHAGOGASTRODUODENOSCOPY     eye cysts Bilateral    LASIK     LEFT HEART CATH AND CORONARY ANGIOGRAPHY N/A 05/16/2018   Procedure: LEFT HEART CATH AND CORONARY ANGIOGRAPHY;  Surgeon: Belva Crome, MD;  [facility]: [facility]:  Angiographically normal coronary arteries with LVEDP of 21 mmHg.  Large draping hyperdominant LAD that wraps the apex and provides distal half of the PDA.  Relatively small caliber distal Cx -->proxLPDA, non-dom RCA. No Cx or Diag lesions -- FALSE + CT FFR   NM MYOVIEW LTD  01/2016; 01/2018   a)LOW RISK. No ischemia or infarction;; b) EF 55-60%. NO ST changes.  No ischemia or Infarction. NO significant RV enlargement.  LOW RISK.   Polysomnogram  02/2018   (Dr. Brett Fairy from neurology): Split study was not adequately done due to low AHI.  She slept reclined and had an AHI less than 10, prolonged hypoxemia and no  hypercapnia. -->  She has home oxygen already prescribed, but did not meet criteria for nightly oxygen.  (Was noted that the patient did not cooperate well with study.  Titration was discussed.   RECTAL POLYPECTOMY     TONSILLECTOMY     TRANSTHORACIC ECHOCARDIOGRAM  01/2016; 05/2017   A) Normal LV chamber size with mod LVH pattern. EF 50-55%. Severe LA dilation. Mod RA dilation. PAP elevated at 38 mmHg;; B) Mild concentric LVH.  EF 55-60% & no RWMA.  Grade 2 DD.  Diastolic flattening of the ventricular septum == ? elevated PAP.  Mild aortic valve calcification/sclerosis.  Mod LA dilation.  Mild RV dilation & Mod RA dilation   TRANSTHORACIC ECHOCARDIOGRAM  10/2017; 01/2018   a) EF 55-60%.  No RWMA,  Moderate LA dilation.  Mild LA dilation.  Peak PA pressures ~57 mmHg (moderate);;; b) EF 55-60%.  GRII DD.  Suggestion of RV volume overload with moderate RV dilation.  Severe LA and RA dilation.Marland Kitchen  PA pressure ~67%.   TUBAL LIGATION     vaginal cyst removed     several times     OB History   No obstetric history on file.      Home Medications     Prior to Admission medications   Medication Sig Start Date End Date Taking? Authorizing Provider  Accu-Chek Softclix Lancets lancets Use as instructed to check blood sugars 2 times per day dx: e11.22 02/17/19  Yes [name], MD  acetaminophen (TYLENOL) 500 MG tablet Take 500 mg by mouth every 4 (four) hours as needed (for pain).    Yes [provider]  albuterol (PROVENTIL HFA;VENTOLIN HFA) 108 (90 Base) MCG/ACT inhaler Inhale 1-2 puffs into the lungs every 6 (six) hours as needed for wheezing or shortness of breath (DOE WHEEZING SOB). 05/27/18  Yes Lendon Colonel, NP  atorvastatin (LIPITOR) 10 MG tablet TAKE 1 TABLET EVERY DAY Patient taking differently: Take 10 mg by mouth daily.  10/01/18  Yes [name], MD  B Complex-C (B-COMPLEX WITH VITAMIN C) tablet Take 1 tablet by mouth daily.   Yes [provider]  Blood Glucose Calibration (ACCU-CHEK AVIVA) SOLN Use as directed dx: e11.22 02/17/19  Yes [name], MD  carvedilol (COREG) 25 MG tablet Take 1 tablet (25 mg total) by mouth 2 (two) times daily with a meal. 12/29/18  Yes Leonie Man, MD  Cholecalciferol (VITAMIN D-3) 1000 units CAPS Take 1,000 Units by mouth daily.   Yes [provider]  diclofenac sodium (VOLTAREN) 1 % GEL Apply 2 g topically 4 (four) times daily as needed (pain). 05/17/18  Yes Vann, Jessica U, DO  DROPLET PEN NEEDLES 31G X 8 MM MISC USE DAILY WITH VICTOZA AND/OR NOVOLOG.  04/29/18  Yes [name], MD  furosemide (LASIX) 40 MG tablet Take one tablet (25m) by mouth on Saturday and Sunday only. Patient taking differently: Take 40 mg by mouth See admin instructions. Take one tablet (460m by mouth on Saturday and Sunday only. 08/19/18  Yes HaLeonie ManMD  furosemide (LASIX) 80 MG tablet Take one tablet (8040mby mouth Monday through Friday only. Patient taking differently: Take 80 mg by mouth See admin instructions. Take one tablet (74m34my mouth Monday through Friday only.  08/19/18  Yes HardLeonie Man  glucose blood (ACCU-CHEK AVIVA PLUS) test strip Use as instructed to check blood sugars 2 times per day dx: e11.22 02/17/19  Yes SandGlendale Chard  hydrALAZINE (APRESOLINE) 100 MG tablet  Take 100 mg twice a day , if blood pressure is greater than 150 mmhg take an extra tablet . Patient taking differently: Take 100 mg by mouth daily as needed. Take 100 mg twice a day as needed if blood pressure is greater than 150 mmhg take an extra tablet . 01/19/19  Yes Leonie Man, MD  Insulin Glargine, 1 Unit Dial, (TOUJEO SOLOSTAR) 300 UNIT/ML SOPN Inject 36 Units into the skin at bedtime. Patient taking differently: Inject 34 Units into the skin at bedtime.  11/06/18  Yes [name], MD  Isopropyl Alcohol (ALCOHOL WIPES) 70 % MISC Apply 1 each topically 2 (two) times daily. 02/17/19  Yes [name], MD  isosorbide mononitrate (IMDUR) 60 MG 24 hr tablet Take 1 tablet (60 mg total) by mouth daily. 08/19/18  Yes Leonie Man, MD  lamoTRIgine (LAMICTAL) 100 MG tablet Take 1 tablet by mouth twice daily 12/29/18  Yes Cameron Sprang, MD  Multiple Vitamins-Minerals (MULTIVITAMIN WITH MINERALS) tablet Take 1 tablet by mouth daily.   Yes [provider]  Pancrelipase, Lip-Prot-Amyl, (ZENPEP) 25000-79000 units CPEP Take 1-2 capsules by mouth See admin instructions. Take 2 capsules with meals and 1 capsule with snacks   Yes [provider]  potassium chloride SA (K-DUR) 20 MEQ tablet TAKE 2 TABLETS ONCE DAILY. BUT ON THE DAY YOU  TAKE METOLAZONE AN  EXTRA TABLET FOR A TOTAL OF 3 TABLETS (60MEQ) Patient taking differently: Take 40 mEq by mouth 2 (two) times daily.  10/24/18  Yes Leonie Man, MD  Semaglutide, 1 MG/DOSE, (OZEMPIC, 1 MG/DOSE,) 2 MG/1.5ML SOPN Inject 1 mg into the skin once a week. 02/17/19  Yes [name], MD  sertraline (ZOLOFT) 50 MG tablet TAKE 1 TABLET EVERY DAY Patient taking differently: Take 50 mg by mouth at bedtime.  07/03/18  Yes  [name], MD  Blood Glucose Monitoring Suppl (ACCU-CHEK AVIVA PLUS) w/Device KIT Use as directed to check blood sugars 2 times per day dx: e11.22 02/09/19   [name], MD    Family History Family History  Problem Relation Age of Onset   Allergies Father    Heart disease Father        before 53   Heart failure Father    Hypertension Father    Hyperlipidemia Father    Heart disease Mother    Diabetes Mother    Hypertension Mother    Hyperlipidemia Mother    Hypertension Sister    Heart disease Sister        before 70   Hyperlipidemia Sister    Diabetes Brother    Hypertension Brother    Diabetes Sister    Hypertension Sister    Diabetes Son    Hypertension Son    Cancer Other     Social History Social History   Tobacco Use   Smoking status: Former Smoker    Packs/day: 0.50    Years: 25.00    Pack years: 12.50    Types: Cigarettes   Smokeless tobacco: Never Used   Tobacco comment: quit smoking 20+ytrs ago  Substance Use Topics   Alcohol use: No   Drug use: No     Allergies   Other, Penicillins, Sulfa antibiotics, Aspirin, and Codeine   Review of Systems Review of Systems  Constitutional: Negative for fever.  HENT: Negative for sore throat.   Eyes: Positive for visual disturbance.  Respiratory: Negative for cough and shortness of breath (no current, reports ealier this week).   Cardiovascular:  Positive for chest pain (episode en route now resolved).  Gastrointestinal: Positive for nausea. Negative for abdominal pain, diarrhea and vomiting.  Genitourinary: Negative for difficulty urinating and dysuria.  Musculoskeletal: Negative for back pain and neck pain.  Skin: Negative for rash.  Neurological: Positive for syncope and speech difficulty. Negative for weakness, light-headedness, numbness and headaches.     Physical Exam Updated Vital Signs BP (!) 115/56 (BP Location: Left Arm)    Pulse 60    Temp 98.8 F (37.1 C)  (Oral)    Resp 16    Ht _0  (1.549 m)    Wt 100.7 kg    SpO2 96%    BMI 41.95 kg/m   Physical Exam Vitals signs and nursing note reviewed.  Constitutional:      General: She is not in acute distress.    Appearance: She is well-developed. She is not diaphoretic.  HENT:     Head: Normocephalic and atraumatic.  Eyes:     General: No visual field deficit.    Conjunctiva/sclera: Conjunctivae normal.  Neck:     Musculoskeletal: Normal range of motion.  Cardiovascular:     Rate and Rhythm: Normal rate and regular rhythm.     Heart sounds: Normal heart sounds. No murmur. No friction rub. No gallop.   Pulmonary:     Effort: Pulmonary effort is normal. No respiratory distress.     Breath sounds: Normal breath sounds. No wheezing or rales.  Abdominal:     General: There is no distension.     Palpations: Abdomen is soft.     Tenderness: There is no abdominal tenderness. There is no guarding.  Musculoskeletal:        General: No tenderness.  Skin:    General: Skin is warm and dry.     Findings: No erythema or rash.  Neurological:     Mental Status: She is alert and oriented to person, place, and time.     GCS: GCS eye subscore is 4. GCS verbal subscore is 5. GCS motor subscore is 6.     Cranial Nerves: Cranial nerves are intact. No cranial nerve deficit, dysarthria or facial asymmetry.     Sensory: Sensation is intact. No sensory deficit.     Motor: Motor function is intact. No tremor or pronator drift.     Coordination: Coordination normal.      ED Treatments / Results  Labs (all labs ordered are listed, but only abnormal results are displayed) Labs Reviewed  CBC - Abnormal; Notable for the following components:      Result Value   Hemoglobin 11.8 (*)    MCV 74.4 (*)    MCH 23.6 (*)    RDW 15.8 (*)    All other components within normal limits  COMPREHENSIVE METABOLIC PANEL - Abnormal; Notable for the following components:   Sodium 134 (*)    Chloride 96 (*)    Glucose,  Bld 135 (*)    Creatinine, Ser 1.15 (*)    GFR calc non Af Amer 48 (*)    GFR calc Af Amer 56 (*)    All other components within normal limits  I-STAT CHEM 8, ED - Abnormal; Notable for the following components:   Chloride 96 (*)    BUN 24 (*)    Creatinine, Ser 1.10 (*)    Glucose, Bld 132 (*)    Calcium, Ion 1.14 (*)    All other components within normal limits  CBG MONITORING, ED - Abnormal; Notable  for the following components:   Glucose-Capillary 125 (*)    All other components within normal limits  SARS CORONAVIRUS 2 (TAT 6-24 HRS)  PROTIME-INR  APTT  DIFFERENTIAL  RAPID URINE DRUG SCREEN, HOSP PERFORMED  MAGNESIUM  PHOSPHORUS  CBC WITH DIFFERENTIAL/PLATELET  COMPREHENSIVE METABOLIC PANEL  LAMOTRIGINE LEVEL  TSH  HEMOGLOBIN A1C  CBC  TROPONIN I (HIGH SENSITIVITY)  TROPONIN I (HIGH SENSITIVITY)  TROPONIN I (HIGH SENSITIVITY)    EKG EKG Interpretation  Date/Time:  Tuesday February 24 2019 15:13:12 EDT Ventricular Rate:  75 PR Interval:    QRS Duration: 102 QT Interval:  402 QTC Calculation: 449 R Axis:   107 Text Interpretation:  Sinus rhythm Probable right ventricular hypertrophy Premature atrial complexes Artifact Abnormal ECG Confirmed by Carmin Muskrat [phone]) on 02/24/2019 3:22:13 PM   Radiology Ct Angio Head W Or Wo Contrast  Result Date: 02/24/2019 CLINICAL DATA:  Left facial droop, aphasia EXAM: CT ANGIOGRAPHY HEAD AND NECK TECHNIQUE: Multidetector CT imaging of the head and neck was performed using the standard protocol during bolus administration of intravenous contrast. Multiplanar CT image reconstructions and MIPs were obtained to evaluate the vascular anatomy. Carotid stenosis measurements (when applicable) are obtained utilizing NASCET criteria, using the distal internal carotid diameter as the denominator. CONTRAST:  173m OMNIPAQUE IOHEXOL 350 MG/ML SOLN COMPARISON:  Noncontrast head CT performed earlier the same day 02/24/2019, brain MRI 06/15/2017  FINDINGS: CTA NECK FINDINGS Aortic arch: Standard branching. Atherosclerotic calcification within the imaged aortic arch and major branch vessels. No high-grade narrowing of the major branch vessel origins. There is prominence of the main (3.4 cm in diameter) and right pulmonary artery, and findings are suggestive of pulmonary hypertension. Right carotid system: The right common and cervical internal carotid arteries are patent without significant stenosis (50% or greater. Mild atherosclerotic calcification at the carotid bifurcation. Partially retropharyngeal course of the right internal carotid artery. Left carotid system: The left common and cervical internal carotid arteries are patent without significant stenosis (50% or greater). Mild atherosclerotic calcification at the carotid bifurcation. Vertebral arteries: The bilateral vertebral arteries are patent within the neck without significant stenosis (50% or greater) Skeleton: No acute bony abnormality. Cervical spondylosis without high-grade bony spinal canal stenosis. Prominent multilevel ventral osteophytes. Other neck: No soft tissue neck mass or pathologically enlarged cervical chain lymph nodes. Upper chest: Centrilobular and paraseptal emphysema within the imaged lung apices. Review of the MIP images confirms the above findings CTA HEAD FINDINGS Anterior circulation: The bilateral intracranial internal carotid arteries are patent without significant stenosis. Partially azygos configuration of the anterior cerebral arteries, arising from a dominant A1 left anterior cerebral artery, without significant proximal stenosis. The bilateral middle cerebral arteries are patent without significant proximal stenosis. No intracranial aneurysm is identified. Posterior circulation: The intracranial vertebral arteries are patent without significant stenosis. The distal right vertebral artery is significantly non dominant. The basilar artery is patent without significant  stenosis. The bilateral posterior cerebral arteries are patent without significant proximal stenosis. Venous sinuses: Within the limitations of contrast timing, no evidence of thrombosis. Anatomic variants: A small left posterior communicating artery is seen. The right posterior communicating artery is poorly delineated and may be hypoplastic or absent. Partially azygos configuration of the anterior cerebral arteries as described Review of the MIP images confirms the above findings These results were communicated to Dr. KLeonel Ramsayat 3:29 pmon 9/1/2020by text page via the AS[facility] Ltdmessaging system. IMPRESSION: CTA head: No large vessel occlusion or proximal high-grade arterial stenosis. CTA neck: Atherosclerotic  disease as described. The bilateral common carotid, cervical internal carotid and vertebral arteries are patent within the neck without significant stenosis (50% or greater). Prominence of the main and right pulmonary artery suggestive of pulmonary hypertension. Pulmonary emphysema. Electronically Signed   By: [name]   On: 02/24/2019 15:30   Ct Angio Neck W Or Wo Contrast  Result Date: 02/24/2019 CLINICAL DATA:  Left facial droop, aphasia EXAM: CT ANGIOGRAPHY HEAD AND NECK TECHNIQUE: Multidetector CT imaging of the head and neck was performed using the standard protocol during bolus administration of intravenous contrast. Multiplanar CT image reconstructions and MIPs were obtained to evaluate the vascular anatomy. Carotid stenosis measurements (when applicable) are obtained utilizing NASCET criteria, using the distal internal carotid diameter as the denominator. CONTRAST:  139m OMNIPAQUE IOHEXOL 350 MG/ML SOLN COMPARISON:  Noncontrast head CT performed earlier the same day 02/24/2019, brain MRI 06/15/2017 FINDINGS: CTA NECK FINDINGS Aortic arch: Standard branching. Atherosclerotic calcification within the imaged aortic arch and major branch vessels. No high-grade narrowing of the major branch vessel  origins. There is prominence of the main (3.4 cm in diameter) and right pulmonary artery, and findings are suggestive of pulmonary hypertension. Right carotid system: The right common and cervical internal carotid arteries are patent without significant stenosis (50% or greater. Mild atherosclerotic calcification at the carotid bifurcation. Partially retropharyngeal course of the right internal carotid artery. Left carotid system: The left common and cervical internal carotid arteries are patent without significant stenosis (50% or greater). Mild atherosclerotic calcification at the carotid bifurcation. Vertebral arteries: The bilateral vertebral arteries are patent within the neck without significant stenosis (50% or greater) Skeleton: No acute bony abnormality. Cervical spondylosis without high-grade bony spinal canal stenosis. Prominent multilevel ventral osteophytes. Other neck: No soft tissue neck mass or pathologically enlarged cervical chain lymph nodes. Upper chest: Centrilobular and paraseptal emphysema within the imaged lung apices. Review of the MIP images confirms the above findings CTA HEAD FINDINGS Anterior circulation: The bilateral intracranial internal carotid arteries are patent without significant stenosis. Partially azygos configuration of the anterior cerebral arteries, arising from a dominant A1 left anterior cerebral artery, without significant proximal stenosis. The bilateral middle cerebral arteries are patent without significant proximal stenosis. No intracranial aneurysm is identified. Posterior circulation: The intracranial vertebral arteries are patent without significant stenosis. The distal right vertebral artery is significantly non dominant. The basilar artery is patent without significant stenosis. The bilateral posterior cerebral arteries are patent without significant proximal stenosis. Venous sinuses: Within the limitations of contrast timing, no evidence of thrombosis. Anatomic  variants: A small left posterior communicating artery is seen. The right posterior communicating artery is poorly delineated and may be hypoplastic or absent. Partially azygos configuration of the anterior cerebral arteries as described Review of the MIP images confirms the above findings These results were communicated to Dr. KLeonel Ramsayat 3:29 pmon 9/1/2020by text page via the [facility] Systemmessaging system. IMPRESSION: CTA head: No large vessel occlusion or proximal high-grade arterial stenosis. CTA neck: Atherosclerotic disease as described. The bilateral common carotid, cervical internal carotid and vertebral arteries are patent within the neck without significant stenosis (50% or greater). Prominence of the main and right pulmonary artery suggestive of pulmonary hypertension. Pulmonary emphysema. Electronically Signed   By: KKellie Simmering  On: 02/24/2019 15:30   Dg Chest Portable 1 View  Result Date: 02/24/2019 CLINICAL DATA:  Chest pain. EXAM: PORTABLE CHEST 1 VIEW COMPARISON:  May 15, 2018. FINDINGS: The heart size remains enlarged. There is no pneumothorax. There  is generalized volume overload. There is atelectasis at the lung bases. No significant pleural effusion. No acute osseous abnormality. The pulmonary arteries appear significantly enlarged. IMPRESSION: 1. No acute cardiopulmonary process. 2. Cardiomegaly. 3. Significant enlargement of the pulmonary arteries which can be seen in patients with elevated pulmonary artery pressures. Electronically Signed   By: Constance Holster M.D.   On: 02/24/2019 16:34   Ct Head Code Stroke Wo Contrast  Result Date: 02/24/2019 CLINICAL DATA:  Code stroke. Focal neuro deficit less than 6 hours. Possible stroke. Left facial droop. EXAM: CT HEAD WITHOUT CONTRAST TECHNIQUE: Contiguous axial images were obtained from the base of the skull through the vertex without intravenous contrast. COMPARISON:  CT head 06/24/2017 FINDINGS: Brain: Ventricle size and cerebral volume  normal. Chronic infarct right parietal cortex. Mild chronic ischemia in the white matter unchanged. Negative for acute infarct, hemorrhage, mass.  No midline shift. Vascular: Negative for hyperdense vessel Skull: Negative Sinuses/Orbits: Paranasal sinuses clear.  Bilateral cataract surgery Other: None ASPECTS ([place] Stroke Program Early CT Score) - Ganglionic level infarction (caudate, lentiform nuclei, internal capsule, insula, M1-M3 cortex): 7 - Supraganglionic infarction (M4-M6 cortex): 3 Total score (0-10 with 10 being normal): 10 IMPRESSION: 1. No acute abnormality 2. ASPECTS is 10 3. These results were called by telephone at the time of interpretation on 02/24/2019 at 2:33 pm to Dr. Leonel Ramsay , who verbally acknowledged these results. Electronically Signed   By: Franchot Gallo M.D.   On: 02/24/2019 14:33    Procedures .Critical Care Performed by: Gareth Morgan, MD Authorized by: Gareth Morgan, MD   Critical care provider statement:    Critical care time (minutes):  30   Critical care was time spent personally by me on the following activities:  Discussions with consultants, evaluation of patient's response to treatment, examination of patient, ordering and performing treatments and interventions, ordering and review of laboratory studies, ordering and review of radiographic studies, pulse oximetry, re-evaluation of patient's condition, obtaining history from patient or surrogate and review of old charts   (including critical care time)  Medications Ordered in ED Medications  sodium chloride flush (NS) 0.9 % injection 3 mL (has no administration in time range)  sodium chloride flush (NS) 0.9 % injection 3 mL (has no administration in time range)  acetaminophen (TYLENOL) tablet 500 mg (has no administration in time range)  HYDROcodone-acetaminophen (NORCO/VICODIN) 5-325 MG per tablet 1 tablet (has no administration in time range)  atorvastatin (LIPITOR) tablet 10 mg (has no  administration in time range)  hydrALAZINE (APRESOLINE) tablet 100 mg (has no administration in time range)  isosorbide mononitrate (IMDUR) 24 hr tablet 60 mg (has no administration in time range)  sertraline (ZOLOFT) tablet 50 mg (has no administration in time range)  Insulin Glargine (1 Unit Dial) SOPN 34 Units (has no administration in time range)  Pancrelipase (Lip-Prot-Amyl) 25000-79000 units CPEP 1-2 capsule (has no administration in time range)  B-complex with vitamin C tablet 1 tablet (has no administration in time range)  Vitamin D-3 CAPS 1,000 Units (has no administration in time range)  multivitamin with minerals tablet 1 tablet (has no administration in time range)  potassium chloride SA (K-DUR) CR tablet 40 mEq (has no administration in time range)  albuterol (VENTOLIN HFA) 108 (90 Base) MCG/ACT inhaler 1-2 puff (has no administration in time range)  heparin injection 5,000 Units (has no administration in time range)  0.9 %  sodium chloride infusion (has no administration in time range)  bisacodyl (DULCOLAX) suppository 10 mg (  has no administration in time range)  polyethylene glycol (MIRALAX / GLYCOLAX) packet 17 g (has no administration in time range)  ondansetron (ZOFRAN) tablet 4 mg (has no administration in time range)    Or  ondansetron (ZOFRAN) injection 4 mg (has no administration in time range)  alum & mag hydroxide-simeth (MAALOX/MYLANTA) 200-200-20 MG/5ML suspension 30 mL (has no administration in time range)  lamoTRIgine (LAMICTAL) tablet 125 mg (has no administration in time range)  iohexol (OMNIPAQUE) 350 MG/ML injection 100 mL (100 mLs Intravenous Contrast Given 02/24/19 1438)     Initial Impression / Assessment and Plan / ED Course  I have reviewed the triage vital signs and the nursing notes.  Pertinent labs & imaging results that were available during my care of the patient were reviewed by me and considered in my medical decision making (see chart for  details).        73yo female with a history of diabetes, COPD, MI, CVA, hypertension, hyperlipidemia, seizures, presents with concern for dysarthria, syncope as a code stroke.  DDx includes CVA, TIA, cardiogenic syncope, seizure, metabolic abnormality. Initial concern for dysarthria, possible facial droop on exam with initial hx from EMS reporting no syncope, however sister arrived who reports she did have syncopal event.  CT code stroke shows no evidence of CVA, no significant deficits not tPA candidate.  Syncopal even without prodrome, had chest pain after event but no dyspnea or CP preceding it and no current CP/dyspnea. Doubt PE, ACS, troponin without significant elevation.  Pt to be admitted for syncope, MRI, EEG.    Final Clinical Impressions(s) / ED Diagnoses   Final diagnoses:  Syncope, unspecified syncope type  Dysarthria    ED Discharge Orders    None       Gareth Morgan, MD 02/24/19 2135

## 2019-02-24 NOTE — ED Triage Notes (Signed)
Pt here from home as a Code Stroke pt was doing laundry and had a syncopal episode caught  By daughter , pt aphasic  According to family , on arrival pt talking

## 2019-02-24 NOTE — Telephone Encounter (Signed)
The pt was notified that Dr. Baird Cancer said that the pt may be nauseated from her pain medication and for the pt to cut back on her pain med.  The pt said that she hasn't had any pain med since this past weekend and that she is feeling a little better.

## 2019-02-24 NOTE — H&P (Addendum)
History and Physical    [name] SUP:[id number] DOB: 11-May-1946 DOA: 02/24/2019  PCP: [name], MD   Patient coming from: Home  Chief Complaint: Passed out and had a funny feeling; nausea  HPI: [name] is a 73 y.o. female with medical history significant for but not limited to COPD, history of nonobstructive CAD with demand ischemia, history of cataracts, history of diabetes mellitus type 2 that is insulin-dependent, history of empty sella syndrome, history of depression and anxiety, chronic diastolic CHF, hypertension, obesity hypoventilation syndrome, history of seizure disorder on antiepileptics, hyperlipidemia, chronic pancreatitis, as well as other comorbidities who presented from home after a chief complaint of passing out.  Patient states that recently been nauseous and attributes it to a "knot in her stomach" and states that she was told that she had a lot of stool burden and states that she was nauseous this morning prior to folding her clothes.  States that she was folding clothes this afternoon and started getting a funny feeling and slumped over against the laundry machine and states that the next thing that she remembers was that people were yelling at her trying to wake her up.  I spoke with the sister who present at bedside and states that patient out for at least 6 minutes and when she came to had some slurred speech and was confused and had a mild facial droop.  EMS was called and brought her to the emergency room and neurology was consulted for a code stroke.  Of note after patient came to she developed some chest pressure and states it was a 5 or 6 out of 10 and now is resolved completely.  TRH was called admit this patient for syncope and collapse with unknown etiology but questionable seizures versus cardiogenic syncope.  Cardiology was consulted for further evaluation recommendations as well and she was placed in observation on telemetry.  ED Course: Patient had  basic blood work done and a code stroke was called on her.  She also had a head CT without contrast and a CTA of the head and neck and neurology was consulted.  Of note no floor was given in the ED and she had a CMP and CBC and high-sensitivity troponins were ordered but never done and asked the EDP to add on a UDS.  I asked the EDP to obtain a chest x-ray given that she had not order to cover test and cover test and is still pending.  Chest x-ray showed "No acute cardiopulmonary process. Cardiomegaly.Significant enlargement of the pulmonary arteries which can be seen in patients with elevated pulmonary artery pressures."  Review of Systems: As per HPI otherwise all other systems reviewed and negative.   Past Medical History:  Diagnosis Date  . Abnormal liver function     in the past.  . Anemia   . Arthritis    all over  . Bruises easily   . Cataract    right eye;immature  . Chronic back pain    stenosis  . Chronic cough   . Chronic kidney disease   . COPD (chronic obstructive pulmonary disease) ([place])   . Demand myocardial infarction [facility]) 2012   Demand Infarction in setting of Pancreatitis --> mild Troponin elevation, NON-OBSTRUCTIVE CAD  . Depression    takes Abilify daily as well as Zoloft  . Diastolic heart failure 5929   Grade 1 diastolic Dysfunction by Echo   . Diverticulosis   . DM (diabetes mellitus) ([place])  takes Victoza daily as well as Lantus and Humalog  . Empty sella ([place])    on MRI in 2009.  . Glaucoma   . Headache(784.0)    last migraine-4-15yr ago  . History of blood transfusion    no abnormal reaction noted  . History of colon polyps    benign  . HTN (hypertension)    takes BKinder Morgan Energydaily  . Hyperlipidemia    takes Lipitor daily  . Joint swelling   . Nocturia   . Ob[place]oventilation syndrome ([place]   . Obstructive sleep apnea 02/2018   Notably improved split-night study with weight loss from 270 (2017) down to 250 pounds (2019).  .  Pancreatitis    takes Pancrelipase daily  . Parkinson's disease ([place]    takes Sinemet daily  . Peripheral neuropathy   . Pneumonia 2012  . Seizures ([place]    takes Lamictal daily and Primidone nightly;last seizure 2wks ago  . Urinary urgency    With increased frequency  . Varicose veins of both lower extremities with pain    With edema.  Takes daily Lasix   Past Surgical History:  Procedure Laterality Date  . ABDOMINAL HYSTERECTOMY    . APPENDECTOMY    . BACK SURGERY    . CARDIAC CATHETERIZATION  2009/March 2012   2009: (Dr. KAlvie Heidelberg Nonobstructive CAD; 2012: Minimal CAD --> false positive stress test  . Cardiac Event Monitor  September-October 2017   Sinus rhythm with occasional PACs and artifact. No arrhythmias besides one short run of tachycardia.  . CHOLECYSTECTOMY    . COLONOSCOPY N/A 08/15/2012   Procedure: COLONOSCOPY;  Surgeon: WArta Silence MD;  Location: [facility];  Service: Endoscopy;  Laterality: N/A;  . COLONOSCOPY WITH PROPOFOL Left 06/17/2017   Procedure: COLONOSCOPY WITH PROPOFOL;  Surgeon: KRonnette Juniper MD;  [facility]  Service: Gastroenterology;  Laterality: Left;  . CORONARY CALCIUM SCORE AND CTA  04/2018   Coronary calcium score 71.9.  Very large, hyperdynamic LAD wrapping apex giving rise to PDA.  Moderate LAD-diagonal and circumflex plaque -> CT FFR suggested positive findings in distal D1, D2 and distal circumflex.  Referred for cath. ==> FALSE POSITIVE  . ESOPHAGOGASTRODUODENOSCOPY    . eye cysts Bilateral   . LASIK    . LEFT HEART CATH AND CORONARY ANGIOGRAPHY N/A 05/16/2018   Procedure: LEFT HEART CATH AND CORONARY ANGIOGRAPHY;  Surgeon: SBelva Crome MD;  [facility]: [facility] INVASIVE CV LAB:  Angiographically normal coronary arteries with LVEDP of 21 mmHg.  Large draping hyperdominant LAD that wraps the apex and provides distal half of the PDA.  Relatively small caliber distal Cx -->proxLPDA, non-dom RCA. No Cx or Diag lesions -- FALSE + CT FFR   . NM MYOVIEW LTD  01/2016; 01/2018   a)LOW RISK. No ischemia or infarction;; b) EF 55-60%. NO ST changes.  No ischemia or Infarction. NO significant RV enlargement.  LOW RISK.  .Marland KitchenPolysomnogram  02/2018   (Dr. DBrett Fairyfrom neurology): Split study was not adequately done due to low AHI.  She slept reclined and had an AHI less than 10, prolonged hypoxemia and no hypercapnia. -->  She has home oxygen already prescribed, but did not meet criteria for nightly oxygen.  (Was noted that the patient did not cooperate well with study.  Titration was discussed.  .Marland KitchenRECTAL POLYPECTOMY    . TONSILLECTOMY    . TRANSTHORACIC ECHOCARDIOGRAM  01/2016; 05/2017   A) Normal LV chamber size with mod LVH pattern. EF 50-55%.  Severe LA dilation. Mod RA dilation. PAP elevated at 38 mmHg;; B) Mild concentric LVH.  EF 55-60% & no RWMA.  Grade 2 DD.  Diastolic flattening of the ventricular septum == ? elevated PAP.  Mild aortic valve calcification/sclerosis.  Mod LA dilation.  Mild RV dilation & Mod RA dilation  . TRANSTHORACIC ECHOCARDIOGRAM  10/2017; 01/2018   a) EF 55-60%.  No RWMA,  Moderate LA dilation.  Mild LA dilation.  Peak PA pressures ~57 mmHg (moderate);;; b) EF 55-60%.  GRII DD.  Suggestion of RV volume overload with moderate RV dilation.  Severe LA and RA dilation.Marland Kitchen  PA pressure ~67%.  . TUBAL LIGATION    . vaginal cyst removed     several times   SOCIAL HISTORY   reports that she has quit smoking. Her smoking use included cigarettes. She has a 12.50 pack-year smoking history. She has never used smokeless tobacco. She reports that she does not drink alcohol or use drugs.  Allergies  Allergen Reactions  . Other Anaphylaxis and Rash    Bleach  . Penicillins Hives    Has patient had a PCN reaction causing immediate rash, facial/tongue/throat swelling, SOB or lightheadedness with hypotension: Yes Has patient had a PCN reaction causing severe rash involving mucus membranes or skin necrosis: No Has patient had a  PCN reaction that required hospitalization: No Has patient had a PCN reaction occurring within the last 10 years: No If all of the above answers are "NO", then may proceed with Cephalosporin use.   . Sulfa Antibiotics Hives  . Aspirin Other (See Comments)     On aspirin 81 mg - Rectal bleeding in dec 2018  . Codeine     Headache and makes the patient feel "off"    Family History  Problem Relation Age of Onset  . Allergies Father   . Heart disease Father        before 66  . Heart failure Father   . Hypertension Father   . Hyperlipidemia Father   . Heart disease Mother   . Diabetes Mother   . Hypertension Mother   . Hyperlipidemia Mother   . Hypertension Sister   . Heart disease Sister        before 66  . Hyperlipidemia Sister   . Diabetes Brother   . Hypertension Brother   . Diabetes Sister   . Hypertension Sister   . Diabetes Son   . Hypertension Son   . Cancer Other    Prior to Admission medications   Medication Sig Start Date End Date Taking? Authorizing Provider  Accu-Chek Softclix Lancets lancets Use as instructed to check blood sugars 2 times per day dx: e11.22 02/17/19   [name], MD  acetaminophen (TYLENOL) 500 MG tablet Take 500 mg by mouth every 4 (four) hours as needed (for pain).     [provider]  albuterol (PROVENTIL HFA;VENTOLIN HFA) 108 (90 Base) MCG/ACT inhaler Inhale 1-2 puffs into the lungs every 6 (six) hours as needed for wheezing or shortness of breath (DOE WHEEZING SOB). 05/27/18   Lendon Colonel, NP  atorvastatin (LIPITOR) 10 MG tablet TAKE 1 TABLET EVERY DAY 10/01/18   [name], MD  B Complex-C (B-COMPLEX WITH VITAMIN C) tablet Take 1 tablet by mouth daily.    [provider]  Blood Glucose Calibration (ACCU-CHEK AVIVA) SOLN Use as directed dx: e11.22 02/17/19   [name], MD  Blood Glucose Monitoring Suppl (ACCU-CHEK AVIVA PLUS) w/Device KIT Use as directed to  check blood sugars 2 times per day dx: e11.22 02/09/19    [name], MD  carvedilol (COREG) 25 MG tablet Take 1 tablet (25 mg total) by mouth 2 (two) times daily with a meal. 12/29/18   Leonie Man, MD  Cholecalciferol (VITAMIN D-3) 1000 units CAPS Take 1,000 Units by mouth daily.    [provider]  diclofenac sodium (VOLTAREN) 1 % GEL Apply 2 g topically 4 (four) times daily as needed (pain). 05/17/18   Eulogio Bear U, DO  DROPLET PEN NEEDLES 31G X 8 MM MISC USE DAILY WITH VICTOZA AND/OR NOVOLOG.  04/29/18   [name], MD  furosemide (LASIX) 40 MG tablet Take one tablet (60m) by mouth on Saturday and Sunday only. 08/19/18   HLeonie Man MD  furosemide (LASIX) 80 MG tablet Take one tablet (858m by mouth Monday through Friday only. 08/19/18   HaLeonie ManMD  glucose blood (ACCU-CHEK AVIVA PLUS) test strip Use as instructed to check blood sugars 2 times per day dx: e11.22 02/17/19   SaGlendale ChardMD  hydrALAZINE (APRESOLINE) 100 MG tablet Take 100 mg twice a day , if blood pressure is greater than 150 mmhg take an extra tablet . 01/19/19   HaLeonie ManMD  HYDROcodone-acetaminophen (NORCO/VICODIN) 5-325 MG tablet Take 1 tablet by mouth every 6 (six) hours as needed for moderate pain. 09/02/18   SaGlendale ChardMD  Insulin Glargine, 1 Unit Dial, (TOUJEO SOLOSTAR) 300 UNIT/ML SOPN Inject 36 Units into the skin at bedtime. Patient taking differently: Inject 34 Units into the skin at bedtime.  11/06/18   SaGlendale ChardMD  Isopropyl Alcohol (ALCOHOL WIPES) 70 % MISC Apply 1 each topically 2 (two) times daily. 02/17/19   SaGlendale ChardMD  isosorbide mononitrate (IMDUR) 60 MG 24 hr tablet Take 1 tablet (60 mg total) by mouth daily. 08/19/18   HaLeonie ManMD  lamoTRIgine (LAMICTAL) 100 MG tablet Take 1 tablet by mouth twice daily 12/29/18   AqCameron SprangMD  Menthol, Topical Analgesic, 3.5 % GEL Apply 1 application topically as needed (for neuropathy in feet). 11/07/17   Medina-Vargas, Monina C, NP  metolazone  (ZAROXOLYN) 2.5 MG tablet Take 1 tablet (2.5 mg total) by mouth See admin instructions. Take 1 tablet Monday and Friday only if weight is greater or equal to 269 11/07/17   Medina-Vargas, Monina C, NP  Multiple Vitamins-Minerals (MULTIVITAMIN WITH MINERALS) tablet Take 1 tablet by mouth daily.    [provider]  Pancrelipase, Lip-Prot-Amyl, (ZENPEP) 25000-79000 units CPEP Take 1-2 capsules by mouth See admin instructions. Take 2 capsules with meals and 1 capsule with snacks    [provider]  potassium chloride SA (K-DUR) 20 MEQ tablet TAKE 2 TABLETS ONCE DAILY. BUT ON THE DAY YOU  TAKE METOLAZONE AN  EXTRA TABLET FOR A TOTAL OF 3 TABLETS (60MEQ) 10/24/18   HaLeonie ManMD  Semaglutide, 1 MG/DOSE, (OZEMPIC, 1 MG/DOSE,) 2 MG/1.5ML SOPN Inject 1 mg into the skin once a week. 02/17/19   SaGlendale ChardMD  sertraline (ZOLOFT) 50 MG tablet TAKE 1 TABLET EVERY DAY 07/03/18   SaGlendale ChardMD   Physical Exam: Vitals:   02/24/19 1545 02/24/19 1556 02/24/19 1600 02/24/19 1730  BP: (!) 148/72  134/70   Pulse:   66 66  Resp: 18  19 18   Temp:  98.5 F (36.9 C)    TempSrc:  Oral    SpO2:   93% 95%  Weight:  Constitutional: WN/WD orbitally obese African-American female currently in, NAD and appears calm and comfortable Eyes: Lids and conjunctivae normal, sclerae anicteric  ENMT: External Ears, Nose appear normal. Grossly normal hearing.  Neck: Appears normal, supple, no cervical masses, normal ROM, no appreciable thyromegaly Respiratory: Diminished to auscultation bilaterally, no wheezing, rales, rhonchi or crackles. Normal respiratory effort and patient is not tachypenic. No accessory muscle use.  Not wearing any supplemental oxygen via nasal cannula and saturating 96% Cardiovascular: RRR, no murmurs / rubs / gallops. S1 and S2 auscultated.  Trace extremity edema. Abdomen: Soft, mildly-tender, distended secondary body habitus. No masses palpated. No appreciable  hepatosplenomegaly. Bowel sounds positive x4.  GU: Deferred. Musculoskeletal: No clubbing / cyanosis of digits/nails.  Has a joint deformity on her left ankle Skin: No rashes, lesions, ulcers on limited skin evaluation. No induration; Warm and dry.  Neurologic: CN 2-12 grossly intact with no focal deficits but had a very slight facial droop.  Romberg sign cerebellar reflexes not assessed.  Psychiatric: Normal judgment and insight. Alert and oriented x 3. Normal mood and appropriate affect.   Labs on Admission: I have personally reviewed following labs and imaging studies  CBC: Recent Labs  Lab 02/24/19 1423 02/24/19 1428  WBC 4.9  --   NEUTROABS 2.6  --   HGB 11.8* 13.3  HCT 37.2 39.0  MCV 74.4*  --   PLT 283  --    Basic Metabolic Panel: Recent Labs  Lab 02/24/19 1423 02/24/19 1428  NA 134* 135  K 4.0 3.8  CL 96* 96*  CO2 26  --   GLUCOSE 135* 132*  BUN 18 24*  CREATININE 1.15* 1.10*  CALCIUM 9.4  --    GFR: Estimated Creatinine Clearance: 52.6 mL/min (A) (by C-G formula based on SCr of 1.1 mg/dL (H)). Liver Function Tests: Recent Labs  Lab 02/24/19 1423  AST 35  ALT 22  ALKPHOS 87  BILITOT 0.9  PROT 7.5  ALBUMIN 3.6   No results for input(s): LIPASE, AMYLASE in the last 168 hours. No results for input(s): AMMONIA in the last 168 hours. Coagulation Profile: Recent Labs  Lab 02/24/19 1423  INR 1.1   Cardiac Enzymes: No results for input(s): CKTOTAL, CKMB, CKMBINDEX, TROPONINI in the last 168 hours. BNP (last 3 results) No results for input(s): PROBNP in the last 8760 hours. HbA1C: No results for input(s): HGBA1C in the last 72 hours. CBG: Recent Labs  Lab 02/24/19 1510  GLUCAP 125*   Lipid Profile: No results for input(s): CHOL, HDL, LDLCALC, TRIG, CHOLHDL, LDLDIRECT in the last 72 hours. Thyroid Function Tests: No results for input(s): TSH, T4TOTAL, FREET4, T3FREE, THYROIDAB in the last 72 hours. Anemia Panel: No results for input(s):  VITAMINB12, FOLATE, FERRITIN, TIBC, IRON, RETICCTPCT in the last 72 hours. Urine analysis:    Component Value Date/Time   COLORURINE YELLOW 06/23/2017 2047   APPEARANCEUR CLEAR 06/23/2017 2047   LABSPEC 1.018 06/23/2017 2047   PHURINE 6.0 06/23/2017 2047   GLUCOSEU >=500 (A) 06/23/2017 2047   GLUCOSEU NEGATIVE 04/17/2016 0955   HGBUR NEGATIVE 06/23/2017 2047   BILIRUBINUR negative 10/22/2018 [place] 06/23/2017 2047   PROTEINUR Negative 10/22/2018 1243   PROTEINUR NEGATIVE 06/23/2017 2047   UROBILINOGEN 0.2 10/22/2018 1243   UROBILINOGEN 0.2 04/17/2016 0955   NITRITE negative 10/22/2018 1243   NITRITE NEGATIVE 06/23/2017 2047   LEUKOCYTESUR Negative 10/22/2018 1243   Sepsis Labs: !!!!!!!!!!!!!!!!!!!!!!!!!!!!!!!!!!!!!!!!!!!! '@[email or web address]'$ (procalcitonin:4,lacticidven:4) )No results found for this or any previous visit (from the past  240 hour(s)).   Radiological Exams on Admission: Ct Angio Head W Or Wo Contrast  Result Date: 02/24/2019 CLINICAL DATA:  Left facial droop, aphasia EXAM: CT ANGIOGRAPHY HEAD AND NECK TECHNIQUE: Multidetector CT imaging of the head and neck was performed using the standard protocol during bolus administration of intravenous contrast. Multiplanar CT image reconstructions and MIPs were obtained to evaluate the vascular anatomy. Carotid stenosis measurements (when applicable) are obtained utilizing NASCET criteria, using the distal internal carotid diameter as the denominator. CONTRAST:  119m OMNIPAQUE IOHEXOL 350 MG/ML SOLN COMPARISON:  Noncontrast head CT performed earlier the same day 02/24/2019, brain MRI 06/15/2017 FINDINGS: CTA NECK FINDINGS Aortic arch: Standard branching. Atherosclerotic calcification within the imaged aortic arch and major branch vessels. No high-grade narrowing of the major branch vessel origins. There is prominence of the main (3.4 cm in diameter) and right pulmonary artery, and findings are suggestive of pulmonary  hypertension. Right carotid system: The right common and cervical internal carotid arteries are patent without significant stenosis (50% or greater. Mild atherosclerotic calcification at the carotid bifurcation. Partially retropharyngeal course of the right internal carotid artery. Left carotid system: The left common and cervical internal carotid arteries are patent without significant stenosis (50% or greater). Mild atherosclerotic calcification at the carotid bifurcation. Vertebral arteries: The bilateral vertebral arteries are patent within the neck without significant stenosis (50% or greater) Skeleton: No acute bony abnormality. Cervical spondylosis without high-grade bony spinal canal stenosis. Prominent multilevel ventral osteophytes. Other neck: No soft tissue neck mass or pathologically enlarged cervical chain lymph nodes. Upper chest: Centrilobular and paraseptal emphysema within the imaged lung apices. Review of the MIP images confirms the above findings CTA HEAD FINDINGS Anterior circulation: The bilateral intracranial internal carotid arteries are patent without significant stenosis. Partially azygos configuration of the anterior cerebral arteries, arising from a dominant A1 left anterior cerebral artery, without significant proximal stenosis. The bilateral middle cerebral arteries are patent without significant proximal stenosis. No intracranial aneurysm is identified. Posterior circulation: The intracranial vertebral arteries are patent without significant stenosis. The distal right vertebral artery is significantly non dominant. The basilar artery is patent without significant stenosis. The bilateral posterior cerebral arteries are patent without significant proximal stenosis. Venous sinuses: Within the limitations of contrast timing, no evidence of thrombosis. Anatomic variants: A small left posterior communicating artery is seen. The right posterior communicating artery is poorly delineated and  may be hypoplastic or absent. Partially azygos configuration of the anterior cerebral arteries as described Review of the MIP images confirms the above findings These results were communicated to Dr. KLeonel Ramsayat 3:29 pmon 9/1/2020by text page via the [facility] system. IMPRESSION: CTA head: No large vessel occlusion or proximal high-grade arterial stenosis. CTA neck: Atherosclerotic disease as described. The bilateral common carotid, cervical internal carotid and vertebral arteries are patent within the neck without significant stenosis (50% or greater). Prominence of the main and right pulmonary artery suggestive of pulmonary hypertension. Pulmonary emphysema. Electronically Signed   By: KKellie Simmering  On: 02/24/2019 15:30   Ct Angio Neck W Or Wo Contrast  Result Date: 02/24/2019 CLINICAL DATA:  Left facial droop, aphasia EXAM: CT ANGIOGRAPHY HEAD AND NECK TECHNIQUE: Multidetector CT imaging of the head and neck was performed using the standard protocol during bolus administration of intravenous contrast. Multiplanar CT image reconstructions and MIPs were obtained to evaluate the vascular anatomy. Carotid stenosis measurements (when applicable) are obtained utilizing NASCET criteria, using the distal internal carotid diameter as the denominator. CONTRAST:  1064m  OMNIPAQUE IOHEXOL 350 MG/ML SOLN COMPARISON:  Noncontrast head CT performed earlier the same day 02/24/2019, brain MRI 06/15/2017 FINDINGS: CTA NECK FINDINGS Aortic arch: Standard branching. Atherosclerotic calcification within the imaged aortic arch and major branch vessels. No high-grade narrowing of the major branch vessel origins. There is prominence of the main (3.4 cm in diameter) and right pulmonary artery, and findings are suggestive of pulmonary hypertension. Right carotid system: The right common and cervical internal carotid arteries are patent without significant stenosis (50% or greater. Mild atherosclerotic calcification at the  carotid bifurcation. Partially retropharyngeal course of the right internal carotid artery. Left carotid system: The left common and cervical internal carotid arteries are patent without significant stenosis (50% or greater). Mild atherosclerotic calcification at the carotid bifurcation. Vertebral arteries: The bilateral vertebral arteries are patent within the neck without significant stenosis (50% or greater) Skeleton: No acute bony abnormality. Cervical spondylosis without high-grade bony spinal canal stenosis. Prominent multilevel ventral osteophytes. Other neck: No soft tissue neck mass or pathologically enlarged cervical chain lymph nodes. Upper chest: Centrilobular and paraseptal emphysema within the imaged lung apices. Review of the MIP images confirms the above findings CTA HEAD FINDINGS Anterior circulation: The bilateral intracranial internal carotid arteries are patent without significant stenosis. Partially azygos configuration of the anterior cerebral arteries, arising from a dominant A1 left anterior cerebral artery, without significant proximal stenosis. The bilateral middle cerebral arteries are patent without significant proximal stenosis. No intracranial aneurysm is identified. Posterior circulation: The intracranial vertebral arteries are patent without significant stenosis. The distal right vertebral artery is significantly non dominant. The basilar artery is patent without significant stenosis. The bilateral posterior cerebral arteries are patent without significant proximal stenosis. Venous sinuses: Within the limitations of contrast timing, no evidence of thrombosis. Anatomic variants: A small left posterior communicating artery is seen. The right posterior communicating artery is poorly delineated and may be hypoplastic or absent. Partially azygos configuration of the anterior cerebral arteries as described Review of the MIP images confirms the above findings These results were communicated  to Dr. Leonel Ramsay at 3:29 pmon 9/1/2020by text page via the [facility] messaging system. IMPRESSION: CTA head: No large vessel occlusion or proximal high-grade arterial stenosis. CTA neck: Atherosclerotic disease as described. The bilateral common carotid, cervical internal carotid and vertebral arteries are patent within the neck without significant stenosis (50% or greater). Prominence of the main and right pulmonary artery suggestive of pulmonary hypertension. Pulmonary emphysema. Electronically Signed   By: [name]   On: 02/24/2019 15:30   Dg Chest Portable 1 View  Result Date: 02/24/2019 CLINICAL DATA:  Chest pain. EXAM: PORTABLE CHEST 1 VIEW COMPARISON:  May 15, 2018. FINDINGS: The heart size remains enlarged. There is no pneumothorax. There is generalized volume overload. There is atelectasis at the lung bases. No significant pleural effusion. No acute osseous abnormality. The pulmonary arteries appear significantly enlarged. IMPRESSION: 1. No acute cardiopulmonary process. 2. Cardiomegaly. 3. Significant enlargement of the pulmonary arteries which can be seen in patients with elevated pulmonary artery pressures. Electronically Signed   By: Constance Holster M.D.   On: 02/24/2019 16:34   Ct Head Code Stroke Wo Contrast  Result Date: 02/24/2019 CLINICAL DATA:  Code stroke. Focal neuro deficit less than 6 hours. Possible stroke. Left facial droop. EXAM: CT HEAD WITHOUT CONTRAST TECHNIQUE: Contiguous axial images were obtained from the base of the skull through the vertex without intravenous contrast. COMPARISON:  CT head 06/24/2017 FINDINGS: Brain: Ventricle size and cerebral volume normal. Chronic infarct right  parietal cortex. Mild chronic ischemia in the white matter unchanged. Negative for acute infarct, hemorrhage, mass.  No midline shift. Vascular: Negative for hyperdense vessel Skull: Negative Sinuses/Orbits: Paranasal sinuses clear.  Bilateral cataract surgery Other: None ASPECTS ([place]  Stroke Program Early CT Score) - Ganglionic level infarction (caudate, lentiform nuclei, internal capsule, insula, M1-M3 cortex): 7 - Supraganglionic infarction (M4-M6 cortex): 3 Total score (0-10 with 10 being normal): 10 IMPRESSION: 1. No acute abnormality 2. ASPECTS is 10 3. These results were called by telephone at the time of interpretation on 02/24/2019 at 2:33 pm to Dr. Leonel Ramsay , who verbally acknowledged these results. Electronically Signed   By: Franchot Gallo M.D.   On: 02/24/2019 14:33    EKG: Independently reviewed.  Showed a sinus rhythm of 75 with PACs and a QTC of 449.  No evidence of any ST elevation or depression on my interpretation  Assessment/Plan Active Problems:   Obesity hypoventilation syndrome (HCC)   Chronic pancreatitis (HCC)   Hypertensive heart disease with chronic diastolic congestive heart failure (HCC)   Dyslipidemia, goal LDL below 100   Type II diabetes mellitus, uncontrolled (HCC)   Seizure disorder (HCC)   Chronic diastolic CHF (congestive heart failure), NYHA class 2 (HCC)   OSA (obstructive sleep apnea) - not on CPAP   Recurrent depression (HCC)   Chest pain   Type 2 diabetes mellitus with stage 2 chronic kidney disease, with long-term current use of insulin (HCC)   Syncope  Syncope and Collapse, unclear Etiology at this Point -Place in observation telemetry -Neurology was consulted and code stroke was unrevealing: Patient is she was aphasic and had slight slurred speech but however this could be a postictal state -Head CT done and showed no acute intracranial abnormality and aspects of 10 -CTA of the head and neck done showed "No large vessel occlusion or proximal high-grade arterial stenosis" on the CTA of Head and CTA of the Neck showed "Atherosclerotic disease as described. The bilateral common carotid, cervical internal carotid and vertebral arteries are patent within the neck without significant stenosis (50% or greater). Prominence of the main  and right pulmonary artery suggestive of pulmonary hypertension. Pulmonary emphysema." -Spoke with neurology Dr. Leonel Ramsay who recommends obtain an MRI of the head and this is been ordered; he does not feel that an EEG would be of any benefit to this was not ordered -We will obtain echocardiogram for possible cardiogenic syncope and continue with telemetry monitoring -We will obtain PT OT evaluations and place the patient on neurochecks -Neurology is adjusting her antiepileptics and she does take lamotrigine at home. -Check high-sensitivity troponins and trend -Cardiology was consulted given her history of near syncope and PACs -EKG was personally reviewed by me and showed some PACs but did show a normal sinus rhythm. -Check TSH -Patient does take some diuretics at home and will hold these for now and defer to cardiology to resume given her recent syncope -We will also order UDS next-SARS-CoV-2 testing is still pending -Continue to monitor patient's clinical response and follow up on specialist recommendations  Chest Pressure rule out ACS -Patient complained of chest pressure after she came to and describe it as a 5 out of 10 and states it was dull -Continue cardiac medications as below -Cycle high-sensitivity cardiac troponins; first 1 was 10 -Repeat EKG next-we will be obtaining echocardiogram next-cardiology is consulted for further evaluation recommendations. -Check UDS  Obesity Hypoventilation Syndrome/Cbstructive Sleep Apnea -Follows up with pulmonary in outpatient setting -Continue with CPAP nightly -  Currently on isosorbide mononitrate 60 mg p.o. daily we will continue; also on hydralazine  -Cardiology was considering calcium channel blockers in outpatient setting -CTA of the neck showed prominence of the main and right pulmonary artery suggestive of pulmonary hypertension and chest x-ray also showed "Significant enlargement of the pulmonary arteries which can be seen in patients  with elevated pulmonary artery pressures" -Patient also takes Lasix at home which we will hold and will defer to cardiology to resume given her recent syncopal episode  Chronic Diastolic CHF/Pulmonary Arterial Hypertension -Currently not in exacerbation but cardiology also feels that she had some right heart sided heart failure from her comorbidities of lung disease obesity We will continue with hydralazine and isosorbide mononitrate as well as carvedilol -Takes furosemide at home which will be currently held given her recent syncopal episode and will defer to cardiology to resume -Cardiology was considering starting ARB in outpatient setting  Hyperlipidemia -Continue statin  Uncontrolled Insulin-Dependent Diabetes Mellitus Type 2 -Insulin-dependent -Continue with home Lantus dosing with 34 units subcu nightly; currently also takes semaglutide 1 mg injection of the skin weekly -We will place on sensitive NovoLog sliding scale insulin before meals and at bedtime -Her recent hemoglobin A1c was done and was 8.6  Seizure Disorder -Questionable had a seizure today -Neurology is adjusting her antiepileptics she takes Lamictal at home but I do not see her taking primidone anymore -Further care per neurology  Depression and Anxiety -Continue sertraline 50 mg p.o. daily  COPD currently not in exacerbation -Continue with home albuterol inhaler 1 to 2 puffs elations every 6 PRN for wheezing shortness of breath  Nonobstructive CAD and history of demand myocardial infarction -Continue atorvastatin 10 mg p.o. daily, carvedilol 25 mg p.o. twice daily along with isosorbide mono nitrate 60 mg p.o. daily and hydralazine 100 over COPD -Cardiology consulted for further evaluation recommendations given her syncope will have them address her nonobstructive CAD  Morbid Obesity -Estimated body mass index is 43.03 kg/m as calculated from the following:   Height as of 01/21/19: '5\' 1"'$  (1.549 m).   Weight as  of this encounter: 103.3 kg. -Weight Losts and Dietary Counseling given  Chronic Pancreatitis -Continue with pancreas replacement supplementation  Chronic Kidney Disease Stage II -Baseline creatinine appears to be around 0.9. -Patient's creatinine was slightly elevated on admission and was 1.15 -We will hold Lasix and defer to cardiology to resume-avoid nephrotoxic medications, contrast dyes as well as hypotension  -Continue to monitor and trend renal function -Repeat CMP in a.m.  DVT prophylaxis: Heparin 5,000 units sq q8h Code Status: FULL CODE Family Communication: Discussed with Sister At bedside  Disposition Plan: Ending further work-up but anticipate discharging back home to home environment Consults called: Neurology Dr. Leonel Ramsay as well as Cardiology Dr. [place] Admission status: Observation Cardiac Telemetry  Severity of Illness: The appropriate patient status for this patient is OBSERVATION. Observation status is judged to be reasonable and necessary in order to provide the required intensity of service to ensure the patient's safety. The patient's presenting symptoms, physical exam findings, and initial radiographic and laboratory data in the context of their medical condition is felt to place them at decreased risk for further clinical deterioration. Furthermore, it is anticipated that the patient will be medically stable for discharge from the hospital within 2 midnights of admission. The following factors support the patient status of observation.   " The patient's presenting symptoms include syncope and passing out. " The physical exam findings include a left foot deformity. "  The initial radiographic and laboratory data are currently fairly unremarkable except that she is some renal insufficiency and pulmonary arterial hypertension.  Kerney Elbe, D.O. [facility] is on [place]  If 7PM-7AM, please contact night-coverage [email or web address] Password [facility]   02/24/2019, 5:36 PM

## 2019-02-24 NOTE — Code Documentation (Signed)
73yo female arriving to [facility] via [place] at 36. Patient from home where she was LKW at 1330 when her family heard her in the laundry room. Per report she had fallen against the machine but did not fall to the ground. Family reported she was weak and not making sense. EMS was called and activated a code stroke. Patient reported right sided headache and 7/10 chest pressure en route to the hospital. Stroke team at the bedside on patient arrival. Labs drawn and patient to CT with team. CT completed. NIHSS 1, see documentation for details and code stroke times. Patient with left facial droop on exam. CTA head and neck completed. Patient is too mild to treat with tPA at this time, however, she remains in the window to treat with tPA until 1800 should symptoms worsen. Handoff with ED RN Crystal.

## 2019-02-24 NOTE — Progress Notes (Signed)
RT Note: This RT spoke with patient about wearing CPAP.  Patient states that she wears nasal prongs and declines wearing the hospital provided machine at this time.  Patient stated that she will have family member bring her CPAP for her to wear during her stay.  RT suggested a couple of liters of O2 while she is asleep tonight. RN present for conversation and agreed. RT will continue to monitor.

## 2019-02-24 NOTE — Plan of Care (Signed)
Alert and oriented X4. Pt was transported to [address] by ED staff. Able to make needs known. Skin warm and dry. No acute distress noted at the moment. Will continue monitoring.

## 2019-02-24 NOTE — ED Notes (Signed)
Cardiology at bedside.

## 2019-02-24 NOTE — ED Notes (Signed)
ED TO INPATIENT HANDOFF REPORT  ED Nurse Name and Phone #:  Wille Glaser [phone]  S Name/Age/Gender [name] 73 y.o. female Room/Bed: 034C/034C  Code Status   Code Status: Prior  Home/SNF/Other Home Patient oriented to: self, place, time and situation Is this baseline? Yes   Triage Complete: Triage complete  Chief Complaint Code Stroke  Triage Note Pt here from home as a Code Stroke pt was doing laundry and had a syncopal episode caught  By daughter , pt aphasic  According to family , on arrival pt talking    Allergies Allergies  Allergen Reactions  . Other Anaphylaxis and Rash    Bleach  . Penicillins Hives    Has patient had a PCN reaction causing immediate rash, facial/tongue/throat swelling, SOB or lightheadedness with hypotension: Yes Has patient had a PCN reaction causing severe rash involving mucus membranes or skin necrosis: No Has patient had a PCN reaction that required hospitalization: No Has patient had a PCN reaction occurring within the last 10 years: No If all of the above answers are "NO", then may proceed with Cephalosporin use.   . Sulfa Antibiotics Hives  . Aspirin Other (See Comments)     On aspirin 81 mg - Rectal bleeding in dec 2018  . Codeine     Headache and makes the patient feel "off"    Level of Care/Admitting Diagnosis ED Disposition    ED Disposition Condition [place]: [place] [100100]  Level of Care: Telemetry Cardiac [103]  I expect the patient will be discharged within 24 hours: Yes  LOW acuity---Tx typically complete <24 hrs---ACUTE conditions typically can be evaluated <24 hours---LABS likely to return to acceptable levels <24 hours---IS near functional baseline---EXPECTED to return to current living arrangement---NOT newly hypoxic: Meets criteria for 5C-Observation unit  Covid Evaluation: Asymptomatic Screening Protocol (No Symptoms)  Diagnosis: Syncope [id number]  Admitting Physician:  [place],  [place] [id number]  Attending Physician: Raiford Noble LATIF [id number]  PT Class (Do Not Modify): Observation [104]  PT Acc Code (Do Not Modify): Observation [10022]       B Medical/Surgery History Past Medical History:  Diagnosis Date  . Abnormal liver function     in the past.  . Anemia   . Arthritis    all over  . Bruises easily   . Cataract    right eye;immature  . Chronic back pain    stenosis  . Chronic cough   . Chronic kidney disease   . COPD (chronic obstructive pulmonary disease) ([place])   . Demand myocardial infarction [facility]) 2012   Demand Infarction in setting of Pancreatitis --> mild Troponin elevation, NON-OBSTRUCTIVE CAD  . Depression    takes Abilify daily as well as Zoloft  . Diastolic heart failure [id number]   Grade 1 diastolic Dysfunction by Echo   . Diverticulosis   . DM (diabetes mellitus) ([place])    takes Victoza daily as well as Lantus and Humalog  . Empty sella ([place])    on MRI in 2009.  . Glaucoma   . Headache(784.0)    last migraine-4-34yrs ago  . History of blood transfusion    no abnormal reaction noted  . History of colon polyps    benign  . HTN (hypertension)    takes [employer] daily  . Hyperlipidemia    takes Lipitor daily  . Joint swelling   . Nocturia   . Obesity hypoventilation syndrome ([place])   . Obstructive sleep apnea  02/2018   Notably improved split-night study with weight loss from 270 (2017) down to 250 pounds (2019).  . Pancreatitis    takes Pancrelipase daily  . Parkinson's disease ([place])    takes Sinemet daily  . Peripheral neuropathy   . Pneumonia 2012  . Seizures ([place])    takes Lamictal daily and Primidone nightly;last seizure 2wks ago  . Urinary urgency    With increased frequency  . Varicose veins of both lower extremities with pain    With edema.  Takes daily Lasix   Past Surgical History:  Procedure Laterality Date  . ABDOMINAL HYSTERECTOMY    . APPENDECTOMY    . BACK SURGERY    .  CARDIAC CATHETERIZATION  2009/March 2012   2009: (Dr. Alvie Heidelberg) Nonobstructive CAD; 2012: Minimal CAD --> false positive stress test  . Cardiac Event Monitor  September-October 2017   Sinus rhythm with occasional PACs and artifact. No arrhythmias besides one short run of tachycardia.  . CHOLECYSTECTOMY    . COLONOSCOPY N/A 08/15/2012   Procedure: COLONOSCOPY;  Surgeon: Arta Silence, MD;  Location: [facility];  Service: Endoscopy;  Laterality: N/A;  . COLONOSCOPY WITH PROPOFOL Left 06/17/2017   Procedure: COLONOSCOPY WITH PROPOFOL;  Surgeon: [name], MD;  Location: [facility];  Service: Gastroenterology;  Laterality: Left;  . CORONARY CALCIUM SCORE AND CTA  04/2018   Coronary calcium score 71.9.  Very large, hyperdynamic LAD wrapping apex giving rise to PDA.  Moderate LAD-diagonal and circumflex plaque -> CT FFR suggested positive findings in distal D1, D2 and distal circumflex.  Referred for cath. ==> FALSE POSITIVE  . ESOPHAGOGASTRODUODENOSCOPY    . eye cysts Bilateral   . LASIK    . LEFT HEART CATH AND CORONARY ANGIOGRAPHY N/A 05/16/2018   Procedure: LEFT HEART CATH AND CORONARY ANGIOGRAPHY;  Surgeon: Belva Crome, MD;  Location: [facility] IN[facility]AS[facility] LAB:  Angiographically normal coronary arteries with LVEDP of 21 mmHg.  Large draping hyperdominant LAD that wraps the apex and provides distal half of the PDA.  Relatively small caliber distal Cx -->proxLPDA, non-dom RCA. No Cx or Diag lesions -- FALSE + CT FFR  . NM MYOVIEW LTD  01/2016; 01/2018   a)LOW RISK. No ischemia or infarction;; b) EF 55-60%. NO ST changes.  No ischemia or Infarction. NO significant RV enlargement.  LOW RISK.  Marland Kitchen Polysomnogram  02/2018   (Dr. Brett Fairy from neurology): Split study was not adequately done due to low AHI.  She slept reclined and had an AHI less than 10, prolonged hypoxemia and no hypercapnia. -->  She has home oxygen already prescribed, but did not meet criteria for nightly oxygen.  (Was noted that the  patient did not cooperate well with study.  Titration was discussed.  Marland Kitchen RECTAL POLYPECTOMY    . TONSILLECTOMY    . TRANSTHORACIC ECHOCARDIOGRAM  01/2016; 05/2017   A) Normal LV chamber size with mod LVH pattern. EF 50-55%. Severe LA dilation. Mod RA dilation. PAP elevated at 38 mmHg;; B) Mild concentric LVH.  EF 55-60% & no RWMA.  Grade 2 DD.  Diastolic flattening of the ventricular septum == ? elevated PAP.  Mild aortic valve calcification/sclerosis.  Mod LA dilation.  Mild RV dilation & Mod RA dilation  . TRANSTHORACIC ECHOCARDIOGRAM  10/2017; 01/2018   a) EF 55-60%.  No RWMA,  Moderate LA dilation.  Mild LA dilation.  Peak PA pressures ~57 mmHg (moderate);;; b) EF 55-60%.  GRII DD.  Suggestion of RV volume overload with moderate RV  dilation.  Severe LA and RA dilation.Marland Kitchen  PA pressure ~67%.  . TUBAL LIGATION    . vaginal cyst removed     several times     A IV Location/Drains/Wounds Patient Lines/Drains/Airways Status   Active Line/Drains/Airways    None          Intake/Output Last 24 hours No intake or output data in the 24 hours ending 02/24/19 1842  Labs/Imaging Results for orders placed or performed during the hospital encounter of 02/24/19 (from the past 48 hour(s))  Protime-INR     Status: None   Collection Time: 02/24/19  2:23 PM  Result Value Ref Range   Prothrombin Time 14.5 11.4 - 15.2 seconds   INR 1.1 0.8 - 1.2    Comment: (NOTE) INR goal varies based on device and disease states. Performed at [facility], [place] [address], [place], [place] [address]   APTT     Status: None   Collection Time: 02/24/19  2:23 PM  Result Value Ref Range   aPTT 32 24 - 36 seconds    Comment: Performed at [facility] [address] [place], [place] [address]  CBC     Status: Abnormal   Collection Time: 02/24/19  2:23 PM  Result Value Ref Range   WBC 4.9 4.0 - 10.5 K/uL   RBC 5.00 3.87 - 5.11 MIL/uL   Hemoglobin 11.8 (L) 12.0 - 15.0 g/dL   HCT 37.2 36.0 - 46.0 %    MCV 74.4 (L) 80.0 - 100.0 fL   MCH 23.6 (L) 26.0 - 34.0 pg   MCHC 31.7 30.0 - 36.0 g/dL   RDW 15.8 (H) 11.5 - 15.5 %   Platelets 283 150 - 400 K/uL   nRBC 0.0 0.0 - 0.2 %    Comment: Performed at [facility] [address] [place], [place] [address]  Differential     Status: None   Collection Time: 02/24/19  2:23 PM  Result Value Ref Range   Neutrophils Relative % 52 %   Neutro Abs 2.6 1.7 - 7.7 K/uL   Lymphocytes Relative 34 %   Lymphs Abs 1.6 0.7 - 4.0 K/uL   Monocytes Relative 11 %   Monocytes Absolute 0.5 0.1 - 1.0 K/uL   Eosinophils Relative 3 %   Eosinophils Absolute 0.1 0.0 - 0.5 K/uL   Basophils Relative 0 %   Basophils Absolute 0.0 0.0 - 0.1 K/uL   Immature Granulocytes 0 %   Abs Immature Granulocytes 0.02 0.00 - 0.07 K/uL    Comment: Performed at [facility] [address], [place], [place] [address]  Comprehensive metabolic panel     Status: Abnormal   Collection Time: 02/24/19  2:23 PM  Result Value Ref Range   Sodium 134 (L) 135 - 145 mmol/L   Potassium 4.0 3.5 - 5.1 mmol/L    Comment: SPECIMEN HEMOLYZED. HEMOLYSIS MAY AFFECT INTEGRITY OF RESULTS.   Chloride 96 (L) 98 - 111 mmol/L   CO2 26 22 - 32 mmol/L   Glucose, Bld 135 (H) 70 - 99 mg/dL   BUN 18 8 - 23 mg/dL   Creatinine, Ser 1.15 (H) 0.44 - 1.00 mg/dL   Calcium 9.4 8.9 - 10.3 mg/dL   Total Protein 7.5 6.5 - 8.1 g/dL   Albumin 3.6 3.5 - 5.0 g/dL   AST 35 15 - 41 U/L   ALT 22 0 - 44 U/L   Alkaline Phosphatase 87 38 - 126  U/L   Total Bilirubin 0.9 0.3 - 1.2 mg/dL   GFR calc non Af Amer 48 (L) >60 mL/min   GFR calc Af Amer 56 (L) >60 mL/min   Anion gap 12 5 - 15    Comment: Performed at [facility] [address], [place], [place] [address]  I-stat chem 8, ED     Status: Abnormal   Collection Time: 02/24/19  2:28 PM  Result Value Ref Range   Sodium 135 135 - 145 mmol/L   Potassium 3.8 3.5 - 5.1 mmol/L   Chloride 96 (L) 98 - 111 mmol/L   BUN 24 (H) 8 - 23 mg/dL   Creatinine,  Ser 1.10 (H) 0.44 - 1.00 mg/dL   Glucose, Bld 132 (H) 70 - 99 mg/dL   Calcium, Ion 1.14 (L) 1.15 - 1.40 mmol/L   TCO2 32 22 - 32 mmol/L   Hemoglobin 13.3 12.0 - 15.0 g/dL   HCT 39.0 36.0 - 46.0 %  CBG monitoring, ED     Status: Abnormal   Collection Time: 02/24/19  3:10 PM  Result Value Ref Range   Glucose-Capillary 125 (H) 70 - 99 mg/dL  Troponin I (High Sensitivity)     Status: None   Collection Time: 02/24/19  4:06 PM  Result Value Ref Range   Troponin I (High Sensitivity) 10 <18 ng/L    Comment: (NOTE) Elevated high sensitivity troponin I (hsTnI) values and significant  changes across serial measurements may suggest ACS but many other  chronic and acute conditions are known to elevate hsTnI results.  Refer to the "Links" section for chest pain algorithms and additional  guidance. Performed at [facility], [place] [address] [place], [place] [address]    Ct Angio Head W Or Wo Contrast  Result Date: 02/24/2019 CLINICAL DATA:  Left facial droop, aphasia EXAM: CT ANGIOGRAPHY HEAD AND NECK TECHNIQUE: Multidetector CT imaging of the head and neck was performed using the standard protocol during bolus administration of intravenous contrast. Multiplanar CT image reconstructions and MIPs were obtained to evaluate the vascular anatomy. Carotid stenosis measurements (when applicable) are obtained utilizing NASCET criteria, using the distal internal carotid diameter as the denominator. CONTRAST:  15mL OMNIPAQUE IOHEXOL 350 MG/ML SOLN COMPARISON:  Noncontrast head CT performed earlier the same day 02/24/2019, brain MRI 06/15/2017 FINDINGS: CTA NECK FINDINGS Aortic arch: Standard branching. Atherosclerotic calcification within the imaged aortic arch and major branch vessels. No high-grade narrowing of the major branch vessel origins. There is prominence of the main (3.4 cm in diameter) and right pulmonary artery, and findings are suggestive of pulmonary hypertension. Right carotid system: The  right common and cervical internal carotid arteries are patent without significant stenosis (50% or greater. Mild atherosclerotic calcification at the carotid bifurcation. Partially retropharyngeal course of the right internal carotid artery. Left carotid system: The left common and cervical internal carotid arteries are patent without significant stenosis (50% or greater). Mild atherosclerotic calcification at the carotid bifurcation. Vertebral arteries: The bilateral vertebral arteries are patent within the neck without significant stenosis (50% or greater) Skeleton: No acute bony abnormality. Cervical spondylosis without high-grade bony spinal canal stenosis. Prominent multilevel ventral osteophytes. Other neck: No soft tissue neck mass or pathologically enlarged cervical chain lymph nodes. Upper chest: Centrilobular and paraseptal emphysema within the imaged lung apices. Review of the MIP images confirms the above findings CTA HEAD FINDINGS Anterior circulation: The bilateral intracranial internal carotid arteries are patent without significant stenosis. Partially azygos configuration of the anterior  cerebral arteries, arising from a dominant A1 left anterior cerebral artery, without significant proximal stenosis. The bilateral middle cerebral arteries are patent without significant proximal stenosis. No intracranial aneurysm is identified. Posterior circulation: The intracranial vertebral arteries are patent without significant stenosis. The distal right vertebral artery is significantly non dominant. The basilar artery is patent without significant stenosis. The bilateral posterior cerebral arteries are patent without significant proximal stenosis. Venous sinuses: Within the limitations of contrast timing, no evidence of thrombosis. Anatomic variants: A small left posterior communicating artery is seen. The right posterior communicating artery is poorly delineated and may be hypoplastic or absent. Partially  azygos configuration of the anterior cerebral arteries as described Review of the MIP images confirms the above findings These results were communicated to Dr. Leonel Ramsay at 3:29 pmon 9/1/2020by text page via the [facility] messaging system. IMPRESSION: CTA head: No large vessel occlusion or proximal high-grade arterial stenosis. CTA neck: Atherosclerotic disease as described. The bilateral common carotid, cervical internal carotid and vertebral arteries are patent within the neck without significant stenosis (50% or greater). Prominence of the main and right pulmonary artery suggestive of pulmonary hypertension. Pulmonary emphysema. Electronically Signed   By: [name]   On: 02/24/2019 15:30   Ct Angio Neck W Or Wo Contrast  Result Date: 02/24/2019 CLINICAL DATA:  Left facial droop, aphasia EXAM: CT ANGIOGRAPHY HEAD AND NECK TECHNIQUE: Multidetector CT imaging of the head and neck was performed using the standard protocol during bolus administration of intravenous contrast. Multiplanar CT image reconstructions and MIPs were obtained to evaluate the vascular anatomy. Carotid stenosis measurements (when applicable) are obtained utilizing NASCET criteria, using the distal internal carotid diameter as the denominator. CONTRAST:  114mL OMNIPAQUE IOHEXOL 350 MG/ML SOLN COMPARISON:  Noncontrast head CT performed earlier the same day 02/24/2019, brain MRI 06/15/2017 FINDINGS: CTA NECK FINDINGS Aortic arch: Standard branching. Atherosclerotic calcification within the imaged aortic arch and major branch vessels. No high-grade narrowing of the major branch vessel origins. There is prominence of the main (3.4 cm in diameter) and right pulmonary artery, and findings are suggestive of pulmonary hypertension. Right carotid system: The right common and cervical internal carotid arteries are patent without significant stenosis (50% or greater. Mild atherosclerotic calcification at the carotid bifurcation. Partially  retropharyngeal course of the right internal carotid artery. Left carotid system: The left common and cervical internal carotid arteries are patent without significant stenosis (50% or greater). Mild atherosclerotic calcification at the carotid bifurcation. Vertebral arteries: The bilateral vertebral arteries are patent within the neck without significant stenosis (50% or greater) Skeleton: No acute bony abnormality. Cervical spondylosis without high-grade bony spinal canal stenosis. Prominent multilevel ventral osteophytes. Other neck: No soft tissue neck mass or pathologically enlarged cervical chain lymph nodes. Upper chest: Centrilobular and paraseptal emphysema within the imaged lung apices. Review of the MIP images confirms the above findings CTA HEAD FINDINGS Anterior circulation: The bilateral intracranial internal carotid arteries are patent without significant stenosis. Partially azygos configuration of the anterior cerebral arteries, arising from a dominant A1 left anterior cerebral artery, without significant proximal stenosis. The bilateral middle cerebral arteries are patent without significant proximal stenosis. No intracranial aneurysm is identified. Posterior circulation: The intracranial vertebral arteries are patent without significant stenosis. The distal right vertebral artery is significantly non dominant. The basilar artery is patent without significant stenosis. The bilateral posterior cerebral arteries are patent without significant proximal stenosis. Venous sinuses: Within the limitations of contrast timing, no evidence of thrombosis. Anatomic variants: A small left posterior  communicating artery is seen. The right posterior communicating artery is poorly delineated and may be hypoplastic or absent. Partially azygos configuration of the anterior cerebral arteries as described Review of the MIP images confirms the above findings These results were communicated to Dr. Leonel Ramsay at 3:29 pmon  9/1/2020by text page via the [facility] messaging system. IMPRESSION: CTA head: No large vessel occlusion or proximal high-grade arterial stenosis. CTA neck: Atherosclerotic disease as described. The bilateral common carotid, cervical internal carotid and vertebral arteries are patent within the neck without significant stenosis (50% or greater). Prominence of the main and right pulmonary artery suggestive of pulmonary hypertension. Pulmonary emphysema. Electronically Signed   By: [name]   On: 02/24/2019 15:30   Dg Chest Portable 1 View  Result Date: 02/24/2019 CLINICAL DATA:  Chest pain. EXAM: PORTABLE CHEST 1 VIEW COMPARISON:  May 15, 2018. FINDINGS: The heart size remains enlarged. There is no pneumothorax. There is generalized volume overload. There is atelectasis at the lung bases. No significant pleural effusion. No acute osseous abnormality. The pulmonary arteries appear significantly enlarged. IMPRESSION: 1. No acute cardiopulmonary process. 2. Cardiomegaly. 3. Significant enlargement of the pulmonary arteries which can be seen in patients with elevated pulmonary artery pressures. Electronically Signed   By: Constance Holster M.D.   On: 02/24/2019 16:34   Ct Head Code Stroke Wo Contrast  Result Date: 02/24/2019 CLINICAL DATA:  Code stroke. Focal neuro deficit less than 6 hours. Possible stroke. Left facial droop. EXAM: CT HEAD WITHOUT CONTRAST TECHNIQUE: Contiguous axial images were obtained from the base of the skull through the vertex without intravenous contrast. COMPARISON:  CT head 06/24/2017 FINDINGS: Brain: Ventricle size and cerebral volume normal. Chronic infarct right parietal cortex. Mild chronic ischemia in the white matter unchanged. Negative for acute infarct, hemorrhage, mass.  No midline shift. Vascular: Negative for hyperdense vessel Skull: Negative Sinuses/Orbits: Paranasal sinuses clear.  Bilateral cataract surgery Other: None ASPECTS ([place] Stroke Program Early CT Score) -  Ganglionic level infarction (caudate, lentiform nuclei, internal capsule, insula, M1-M3 cortex): 7 - Supraganglionic infarction (M4-M6 cortex): 3 Total score (0-10 with 10 being normal): 10 IMPRESSION: 1. No acute abnormality 2. ASPECTS is 10 3. These results were called by telephone at the time of interpretation on 02/24/2019 at 2:33 pm to Dr. Leonel Ramsay , who verbally acknowledged these results. Electronically Signed   By: Franchot Gallo M.D.   On: 02/24/2019 14:33    Pending Labs Unresulted Labs (From admission, onward)    Start     Ordered   02/25/19 0500  Magnesium  Tomorrow morning,   R     02/24/19 1741   02/25/19 0500  Phosphorus  Tomorrow morning,   R     02/24/19 1741   02/25/19 0500  CBC with Differential/Platelet  Tomorrow morning,   R     02/24/19 1741   02/25/19 0500  Comprehensive metabolic panel  Tomorrow morning,   R     02/24/19 1741   02/24/19 1626  SARS CORONAVIRUS 2 (TAT 6-24 HRS) Nasopharyngeal Nasopharyngeal Swab  (Asymptomatic/Tier 2 Patients Labs)  Once,   STAT    Question Answer Comment  Is this test for diagnosis or screening Screening   Symptomatic for COVID-19 as defined by CDC No   Hospitalized for COVID-19 No   Admitted to ICU for COVID-19 No   Previously tested for COVID-19 No   Resident in a congregate (group) care setting No   Employed in healthcare setting No   Pregnant No  02/24/19 1626   02/24/19 1625  Rapid urine drug screen (hospital performed)  ONCE - STAT,   STAT     02/24/19 1624   Signed and Held  TSH  Tomorrow morning,   R     Signed and Held   Signed and Held  Hemoglobin A1c  Once,   R     Signed and Held   Signed and Held  Comprehensive metabolic panel  Tomorrow morning,   R     Signed and Held   Signed and Held  CBC  Tomorrow morning,   R     Signed and Held          Vitals/Pain Today's Vitals   02/24/19 1545 02/24/19 1556 02/24/19 1600 02/24/19 1730  BP: (!) 148/72  134/70   Pulse:   66 66  Resp: 18  19 18   Temp:  98.5  F (36.9 C)    TempSrc:  Oral    SpO2:   93% 95%  Weight:      PainSc:        Isolation Precautions No active isolations  Medications Medications  sodium chloride flush (NS) 0.9 % injection 3 mL (has no administration in time range)  iohexol (OMNIPAQUE) 350 MG/ML injection 100 mL (100 mLs Intravenous Contrast Given 02/24/19 1438)    Mobility walks Moderate fall risk   Focused Assessments Neuro Assessment Handoff:  Swallow screen pass? Yes    NIH Stroke Scale ( + Modified Stroke Scale Criteria)  Interval: Initial Level of Consciousness (1a.)   : Alert, keenly responsive LOC Questions (1b. )   +: Answers both questions correctly LOC Commands (1c. )   + : Performs both tasks correctly Best Gaze (2. )  +: Normal Visual (3. )  +: No visual loss Facial Palsy (4. )    : Minor paralysis(left) Motor Arm, Left (5a. )   +: No drift Motor Arm, Right (5b. )   +: No drift Motor Leg, Left (6a. )   +: No drift Motor Leg, Right (6b. )   +: No drift Limb Ataxia (7. ): Absent Sensory (8. )   +: Normal, no sensory loss Best Language (9. )   +: No aphasia Dysarthria (10. ): Normal Extinction/Inattention (11.)   +: No Abnormality Modified SS Total  +: 0 Complete NIHSS TOTAL: 1 Last date known well: 02/24/19 Last time known well: 1330 Neuro Assessment:   Neuro Checks:   Initial (02/24/19 1420)  Last Documented NIHSS Modified Score: 0 (02/24/19 1727) Has TPA been given? No If patient is a Neuro Trauma and patient is going to OR before floor call report to [facility] nurse: [phone] or [phone]     R Recommendations: See Admitting Provider Note  Report given to:   Additional Notes:

## 2019-02-25 ENCOUNTER — Observation Stay (HOSPITAL_COMMUNITY): Payer: Medicare HMO

## 2019-02-25 ENCOUNTER — Telehealth: Payer: Self-pay | Admitting: *Deleted

## 2019-02-25 ENCOUNTER — Observation Stay (HOSPITAL_BASED_OUTPATIENT_CLINIC_OR_DEPARTMENT_OTHER): Payer: Medicare HMO

## 2019-02-25 ENCOUNTER — Other Ambulatory Visit: Payer: Self-pay | Admitting: Physician Assistant

## 2019-02-25 DIAGNOSIS — R55 Syncope and collapse: Secondary | ICD-10-CM

## 2019-02-25 DIAGNOSIS — R079 Chest pain, unspecified: Secondary | ICD-10-CM | POA: Diagnosis not present

## 2019-02-25 DIAGNOSIS — I5032 Chronic diastolic (congestive) heart failure: Secondary | ICD-10-CM | POA: Diagnosis not present

## 2019-02-25 DIAGNOSIS — I110 Hypertensive heart disease with heart failure: Secondary | ICD-10-CM | POA: Diagnosis not present

## 2019-02-25 DIAGNOSIS — G40909 Epilepsy, unspecified, not intractable, without status epilepticus: Secondary | ICD-10-CM | POA: Diagnosis not present

## 2019-02-25 LAB — GLUCOSE, CAPILLARY
Glucose-Capillary: 131 mg/dL — ABNORMAL HIGH (ref 70–99)
Glucose-Capillary: 137 mg/dL — ABNORMAL HIGH (ref 70–99)
Glucose-Capillary: 143 mg/dL — ABNORMAL HIGH (ref 70–99)
Glucose-Capillary: 184 mg/dL — ABNORMAL HIGH (ref 70–99)
Glucose-Capillary: 77 mg/dL (ref 70–99)

## 2019-02-25 LAB — CBC WITH DIFFERENTIAL/PLATELET
Abs Immature Granulocytes: 0.02 10*3/uL (ref 0.00–0.07)
Basophils Absolute: 0 10*3/uL (ref 0.0–0.1)
Basophils Relative: 0 %
Eosinophils Absolute: 0.1 10*3/uL (ref 0.0–0.5)
Eosinophils Relative: 2 %
HCT: 36.6 % (ref 36.0–46.0)
Hemoglobin: 11.5 g/dL — ABNORMAL LOW (ref 12.0–15.0)
Immature Granulocytes: 0 %
Lymphocytes Relative: 26 %
Lymphs Abs: 1.4 10*3/uL (ref 0.7–4.0)
MCH: 23.8 pg — ABNORMAL LOW (ref 26.0–34.0)
MCHC: 31.4 g/dL (ref 30.0–36.0)
MCV: 75.8 fL — ABNORMAL LOW (ref 80.0–100.0)
Monocytes Absolute: 0.6 10*3/uL (ref 0.1–1.0)
Monocytes Relative: 11 %
Neutro Abs: 3.1 10*3/uL (ref 1.7–7.7)
Neutrophils Relative %: 61 %
Platelets: 241 10*3/uL (ref 150–400)
RBC: 4.83 MIL/uL (ref 3.87–5.11)
RDW: 15.7 % — ABNORMAL HIGH (ref 11.5–15.5)
WBC: 5.3 10*3/uL (ref 4.0–10.5)
nRBC: 0 % (ref 0.0–0.2)

## 2019-02-25 LAB — MAGNESIUM: Magnesium: 1.9 mg/dL (ref 1.7–2.4)

## 2019-02-25 LAB — ECHOCARDIOGRAM COMPLETE
Height: 61 in
Weight: 3566.16 oz

## 2019-02-25 LAB — COMPREHENSIVE METABOLIC PANEL
ALT: 16 U/L (ref 0–44)
AST: 18 U/L (ref 15–41)
Albumin: 3.3 g/dL — ABNORMAL LOW (ref 3.5–5.0)
Alkaline Phosphatase: 79 U/L (ref 38–126)
Anion gap: 11 (ref 5–15)
BUN: 15 mg/dL (ref 8–23)
CO2: 27 mmol/L (ref 22–32)
Calcium: 9 mg/dL (ref 8.9–10.3)
Chloride: 98 mmol/L (ref 98–111)
Creatinine, Ser: 1.1 mg/dL — ABNORMAL HIGH (ref 0.44–1.00)
GFR calc Af Amer: 59 mL/min — ABNORMAL LOW (ref >60)
GFR calc non Af Amer: 51 mL/min — ABNORMAL LOW (ref >60)
Glucose, Bld: 231 mg/dL — ABNORMAL HIGH (ref 70–99)
Potassium: 3.1 mmol/L — ABNORMAL LOW (ref 3.5–5.1)
Sodium: 136 mmol/L (ref 135–145)
Total Bilirubin: 0.7 mg/dL (ref 0.3–1.2)
Total Protein: 6.8 g/dL (ref 6.5–8.1)

## 2019-02-25 LAB — TSH: TSH: 1.332 u[IU]/mL (ref 0.350–4.500)

## 2019-02-25 LAB — PHOSPHORUS: Phosphorus: 4.1 mg/dL (ref 2.5–4.6)

## 2019-02-25 MED ORDER — INSULIN ASPART 100 UNIT/ML ~~LOC~~ SOLN
0.0000 [IU] | Freq: Three times a day (TID) | SUBCUTANEOUS | Status: DC
  Administered 2019-02-25 (×2): 2 [IU] via SUBCUTANEOUS
  Administered 2019-02-26: 3 [IU] via SUBCUTANEOUS

## 2019-02-25 MED ORDER — FUROSEMIDE 40 MG PO TABS
40.0000 mg | ORAL_TABLET | Freq: Every day | ORAL | Status: DC
  Administered 2019-02-25 – 2019-02-26 (×2): 40 mg via ORAL
  Filled 2019-02-25 (×2): qty 1

## 2019-02-25 MED ORDER — INSULIN ASPART 100 UNIT/ML ~~LOC~~ SOLN: 0.0000 [IU] | Freq: Every day | SUBCUTANEOUS | Status: DC

## 2019-02-25 MED ORDER — POTASSIUM CHLORIDE CRYS ER 20 MEQ PO TBCR
40.0000 meq | EXTENDED_RELEASE_TABLET | Freq: Two times a day (BID) | ORAL | Status: DC
  Administered 2019-02-25 – 2019-02-26 (×3): 40 meq via ORAL
  Filled 2019-02-25 (×3): qty 2

## 2019-02-25 NOTE — Evaluation (Signed)
Occupational Therapy Evaluation Patient Details Name: [name] MRN: [id number] DOB: 1945/08/26 Today's Date: 02/25/2019    History of Present Illness Pt is a 73 y/o female who presents s/p syncopal episode while doing laundry at home. MRI negative for acute changes. PMH signficant PMH significant for seizures, peripheral neuropathy, Parkinson's disease, HTN, glaucoma, DM, diverticulosis, MI, COPD, CKD, chronic back pain.   Clinical Impression   Pt admitted with above and presents to OT with impairments effecting ability to complete ADLs at [facility].  Pt currently requiring min guard for dynamic standing balance during self-care tasks.  Pt completed functional transfers without AD at min guard to min assist level, pt reports she occasionally walks around her home without AD.  Pt appears to be near baseline, however reports typically engages in IADLs such as laundry and occasionally making breakfast and wanting to return to these.  Pt agreeable to continued therapy for overall balance and endurance as needed to engage in functional tasks.  Pt will benefit from OT acutely to increase independence and then [place] upon d/c home.    Follow Up Recommendations  Home health OT;Supervision - Intermittent    Equipment Recommendations  None recommended by OT       Precautions / Restrictions Precautions Precautions: Fall Restrictions Weight Bearing Restrictions: No      Mobility Bed Mobility Overal bed mobility: Needs Assistance Bed Mobility: Supine to Sit;Sit to Supine     Supine to sit: Supervision Sit to supine: Supervision   General bed mobility comments: Increased time and use of rails required. HOB flat to simulate home environment.   Transfers Overall transfer level: Needs assistance Equipment used: None Transfers: Sit to/from Stand Sit to Stand: Min guard;Min assist         General transfer comment: Without AD, pt required min assist for balance support and safety to  gain/maintain balance.    Balance Overall balance assessment: Needs assistance Sitting-balance support: Feet supported;No upper extremity supported Sitting balance-Leahy Scale: Good Sitting balance - Comments: Able to don socks but more difficulty on the right due to knee weakness   Standing balance support: Single extremity supported Standing balance-Leahy Scale: Poor Standing balance comment: without UE support pt reaching for external support                           ADL either performed or assessed with clinical judgement   ADL Overall ADL's : Needs assistance/impaired     Grooming: Wash/dry hands;Wash/dry face;Standing;Supervision/safety   Upper Body Bathing: Supervision/ safety;Sitting   Lower Body Bathing: Min guard;Sit to/from stand;Sitting/lateral leans       Lower Body Dressing: Supervision/safety;Sitting/lateral leans;Sit to/from stand   Toilet Transfer: Min guard;Ambulation Toilet Transfer Details (indicate cue type and reason): ambulated to toilet without AD, min guard for safety Toileting- Clothing Manipulation and Hygiene: Min guard;Sit to/from stand       Functional mobility during ADLs: Min guard General ADL Comments: Pt completed LB dressing with donning hospital socks and completed toilet transfer with min guard to supervision without AD.Pt reports no difficulty with self-care tasks.     Vision Baseline Vision/History: Wears glasses Wears Glasses: At all times Patient Visual Report: No change from baseline Vision Assessment?: No apparent visual deficits            Pertinent Vitals/Pain Pain Assessment: No/denies pain     Hand Dominance Right   Extremity/Trunk Assessment Upper Extremity Assessment Upper Extremity Assessment: Overall WFL for tasks assessed;Generalized  weakness   Lower Extremity Assessment Lower Extremity Assessment: Generalized weakness;RLE deficits/detail RLE Deficits / Details: Pt reports long history of R knee  pain/weakness   Cervical / Trunk Assessment Cervical / Trunk Assessment: Other exceptions Cervical / Trunk Exceptions: Forward head posture with rounded shoulders   Communication Communication Communication: No difficulties   Cognition Arousal/Alertness: Awake/alert Behavior During Therapy: WFL for tasks assessed/performed Overall Cognitive Status: Within Functional Limits for tasks assessed                                                Home Living Family/patient expects to be discharged to:: Private residence Living Arrangements: Other (Comment)(lives with sister and brother in law) Available Help at Discharge: Family;Available 24 hours/day Type of Home: House Home Access: Stairs to enter [employer] of Steps: 3 Entrance Stairs-Rails: Left Home Layout: One level     Bathroom Shower/Tub: Tub/shower unit;Curtain   Biochemist, clinical: Standard     Home Equipment: Cane - single point;Bedside commode;Tub bench;Walker - 4 wheels          Prior Functioning/Environment Level of Independence: Needs assistance  Gait / Transfers Assistance Needed: SPC use especially in community ADL's / Homemaking Assistance Needed: Does her own ADLs. Completes simple meal prep and laundry tasks   Comments: Cooks breakfast occasionally, otherwise sister takes care of meals. Pt is able to intermittently         OT Problem List: Decreased strength;Decreased activity tolerance;Impaired balance (sitting and/or standing)      OT Treatment/Interventions: Self-care/ADL training;Energy conservation;DME and/or AE instruction;Therapeutic activities;Patient/family education;Balance training    OT Goals(Current goals can be found in the care plan section) Acute Rehab OT Goals Patient Stated Goal: home today OT Goal Formulation: With patient Time For Goal Achievement: 03/11/19 Potential to Achieve Goals: Good  OT Frequency: Min 2X/week    AM-PAC OT "6 Clicks" Daily  Activity     Outcome Measure Help from another person eating meals?: None Help from another person taking care of personal grooming?: A Little Help from another person toileting, which includes using toliet, bedpan, or urinal?: A Little Help from another person bathing (including washing, rinsing, drying)?: A Little Help from another person to put on and taking off regular upper body clothing?: A Little Help from another person to put on and taking off regular lower body clothing?: A Little 6 Click Score: 19   End of Session Equipment Utilized During Treatment: Gait belt Nurse Communication: Mobility status  Activity Tolerance: Patient tolerated treatment well Patient left: in bed;with call bell/phone within reach;with bed alarm set  OT Visit Diagnosis: Unsteadiness on feet (R26.81);History of falling (Z91.81)                Time: [id number] OT Time Calculation (min): 15 min Charges:  OT General Charges $OT Visit: 1 Visit OT Evaluation $OT Eval Low Complexity: [place], [place] 02/25/2019, 1:23 PM

## 2019-02-25 NOTE — Progress Notes (Signed)
Progress Note  Patient Name: [name] Date of Encounter: 02/25/2019  Primary Cardiologist: Bryan Lemma, MD   Subjective   She feels well today, denies chest pain, dizziness.  Inpatient Medications    Scheduled Meds: . atorvastatin  10 mg Oral q1800  . B-complex with vitamin C  1 tablet Oral Daily  . cholecalciferol  1,000 Units Oral Daily  . furosemide  40 mg Oral Daily  . heparin  5,000 Units Subcutaneous Q8H  . hydrALAZINE  100 mg Oral BID  . insulin aspart  0-15 Units Subcutaneous TID WC  . insulin aspart  0-5 Units Subcutaneous QHS  . insulin glargine  34 Units Subcutaneous QHS  . isosorbide mononitrate  60 mg Oral Daily  . lamoTRIgine  125 mg Oral BID  . lipase/protease/amylase  48,000 Units Oral TID WC  . multivitamin with minerals  1 tablet Oral Daily  . potassium chloride SA  40 mEq Oral BID  . sertraline  50 mg Oral Daily  . sodium chloride flush  3 mL Intravenous Once  . sodium chloride flush  3 mL Intravenous Q12H   Continuous Infusions:  PRN Meds: acetaminophen, albuterol, alum & mag hydroxide-simeth, bisacodyl, lipase/protease/amylase, ondansetron **OR** ondansetron (ZOFRAN) IV, polyethylene glycol   Vital Signs    Vitals:   02/25/19 0000 02/25/19 0408 02/25/19 0500 02/25/19 0730  BP:  127/63  (!) 136/59  Pulse: 62 66  61  Resp:  18  16  Temp:  98.2 F (36.8 C)  97.7 F (36.5 C)  TempSrc:  Oral  Oral  SpO2:  96%  94%  Weight:   101.1 kg   Height:        Intake/Output Summary (Last 24 hours) at 02/25/2019 0920 Last data filed at 02/25/2019 0603 Gross per 24 hour  Intake --  Output 200 ml  Net -200 ml   Last 3 Weights 02/25/2019 02/24/2019 02/24/2019  Weight (lbs) 222 lb 14.2 oz 222 lb 0.1 oz 227 lb 11.8 oz  Weight (kg) 101.1 kg 100.7 kg 103.3 kg      Telemetry    SR, 1 episode of atrial tachycardia - 7 beats - Personally Reviewed  ECG    SR - Personally Reviewed  Physical Exam   GEN: No acute distress.   Neck: No JVD Cardiac:  RRR, no murmurs, rubs, or gallops.  Respiratory: Clear to auscultation bilaterally. GI: Soft, nontender, non-distended  MS: No edema; No deformity. Neuro:  Nonfocal  Psych: Normal affect   Labs    High Sensitivity Troponin:   Recent Labs  Lab 02/24/19 1606 02/24/19 1830 02/24/19 1950  TROPONINIHS 10 11 11       Chemistry Recent Labs  Lab 02/24/19 1423 02/24/19 1428 02/25/19 0332  NA 134* 135 136  K 4.0 3.8 3.1*  CL 96* 96* 98  CO2 26  --  27  GLUCOSE 135* 132* 231*  BUN 18 24* 15  CREATININE 1.15* 1.10* 1.10*  CALCIUM 9.4  --  9.0  PROT 7.5  --  6.8  ALBUMIN 3.6  --  3.3*  AST 35  --  18  ALT 22  --  16  ALKPHOS 87  --  79  BILITOT 0.9  --  0.7  GFRNONAA 48*  --  51*  GFRAA 56*  --  59*  ANIONGAP 12  --  11    Hematology Recent Labs  Lab 02/24/19 1423 02/24/19 1428 02/25/19 0332  WBC 4.9  --  5.3  RBC 5.00  --  4.83  HGB 11.8* 13.3 11.5*  HCT 37.2 39.0 36.6  MCV 74.4*  --  75.8*  MCH 23.6*  --  23.8*  MCHC 31.7  --  31.4  RDW 15.8*  --  15.7*  PLT 283  --  241    BNPNo results for input(s): BNP, PROBNP in the last 168 hours.   DDimer No results for input(s): DDIMER in the last 168 hours.   Radiology    Ct Angio Head W Or Wo Contrast  Result Date: 02/24/2019 CLINICAL DATA:  Left facial droop, aphasia EXAM: CT ANGIOGRAPHY HEAD AND NECK TECHNIQUE: Multidetector CT imaging of the head and neck was performed using the standard protocol during bolus administration of intravenous contrast. Multiplanar CT image reconstructions and MIPs were obtained to evaluate the vascular anatomy. Carotid stenosis measurements (when applicable) are obtained utilizing NASCET criteria, using the distal internal carotid diameter as the denominator. CONTRAST:  OMNIPAQUE IOHEXOL 350 MG/ML SOLN COMPARISON:  Noncontrast head CT performed earlier the same day 02/24/2019, brain MRI 06/15/2017 FINDINGS: CTA NECK FINDINGS Aortic arch: Standard branching. Atherosclerotic  calcification within the imaged aortic arch and major branch vessels. No high-grade narrowing of the major branch vessel origins. There is prominence of the main (3.4 cm in diameter) and right pulmonary artery, and findings are suggestive of pulmonary hypertension. Right carotid system: The right common and cervical internal carotid arteries are patent without significant stenosis (50% or greater. Mild atherosclerotic calcification at the carotid bifurcation. Partially retropharyngeal course of the right internal carotid artery. Left carotid system: The left common and cervical internal carotid arteries are patent without significant stenosis (50% or greater). Mild atherosclerotic calcification at the carotid bifurcation. Vertebral arteries: The bilateral vertebral arteries are patent within the neck without significant stenosis (50% or greater) Skeleton: No acute bony abnormality. Cervical spondylosis without high-grade bony spinal canal stenosis. Prominent multilevel ventral osteophytes. Other neck: No soft tissue neck mass or pathologically enlarged cervical chain lymph nodes. Upper chest: Centrilobular and paraseptal emphysema within the imaged lung apices. Review of the MIP images confirms the above findings CTA HEAD FINDINGS Anterior circulation: The bilateral intracranial internal carotid arteries are patent without significant stenosis. Partially azygos configuration of the anterior cerebral arteries, arising from a dominant A1 left anterior cerebral artery, without significant proximal stenosis. The bilateral middle cerebral arteries are patent without significant proximal stenosis. No intracranial aneurysm is identified. Posterior circulation: The intracranial vertebral arteries are patent without significant stenosis. The distal right vertebral artery is significantly non dominant. The basilar artery is patent without significant stenosis. The bilateral posterior cerebral arteries are patent without  significant proximal stenosis. Venous sinuses: Within the limitations of contrast timing, no evidence of thrombosis. Anatomic variants: A small left posterior communicating artery is seen. The right posterior communicating artery is poorly delineated and may be hypoplastic or absent. Partially azygos configuration of the anterior cerebral arteries as described Review of the MIP images confirms the above findings These results were communicated to Dr. Amada Jupiter at 3:29 pmon 9/1/2020by text page via the [facility] messaging system. IMPRESSION: CTA head: No large vessel occlusion or proximal high-grade arterial stenosis. CTA neck: Atherosclerotic disease as described. The bilateral common carotid, cervical internal carotid and vertebral arteries are patent within the neck without significant stenosis (50% or greater). Prominence of the main and right pulmonary artery suggestive of pulmonary hypertension. Pulmonary emphysema. Electronically Signed   By: Jackey Loge   On: 02/24/2019 15:30   Ct Angio Neck W Or Wo Contrast  Result  Date: 02/24/2019 CLINICAL DATA:  Left facial droop, aphasia EXAM: CT ANGIOGRAPHY HEAD AND NECK TECHNIQUE: Multidetector CT imaging of the head and neck was performed using the standard protocol during bolus administration of intravenous contrast. Multiplanar CT image reconstructions and MIPs were obtained to evaluate the vascular anatomy. Carotid stenosis measurements (when applicable) are obtained utilizing NASCET criteria, using the distal internal carotid diameter as the denominator. CONTRAST:  OMNIPAQUE IOHEXOL 350 MG/ML SOLN COMPARISON:  Noncontrast head CT performed earlier the same day 02/24/2019, brain MRI 06/15/2017 FINDINGS: CTA NECK FINDINGS Aortic arch: Standard branching. Atherosclerotic calcification within the imaged aortic arch and major branch vessels. No high-grade narrowing of the major branch vessel origins. There is prominence of the main (3.4 cm in diameter) and right  pulmonary artery, and findings are suggestive of pulmonary hypertension. Right carotid system: The right common and cervical internal carotid arteries are patent without significant stenosis (50% or greater. Mild atherosclerotic calcification at the carotid bifurcation. Partially retropharyngeal course of the right internal carotid artery. Left carotid system: The left common and cervical internal carotid arteries are patent without significant stenosis (50% or greater). Mild atherosclerotic calcification at the carotid bifurcation. Vertebral arteries: The bilateral vertebral arteries are patent within the neck without significant stenosis (50% or greater) Skeleton: No acute bony abnormality. Cervical spondylosis without high-grade bony spinal canal stenosis. Prominent multilevel ventral osteophytes. Other neck: No soft tissue neck mass or pathologically enlarged cervical chain lymph nodes. Upper chest: Centrilobular and paraseptal emphysema within the imaged lung apices. Review of the MIP images confirms the above findings CTA HEAD FINDINGS Anterior circulation: The bilateral intracranial internal carotid arteries are patent without significant stenosis. Partially azygos configuration of the anterior cerebral arteries, arising from a dominant A1 left anterior cerebral artery, without significant proximal stenosis. The bilateral middle cerebral arteries are patent without significant proximal stenosis. No intracranial aneurysm is identified. Posterior circulation: The intracranial vertebral arteries are patent without significant stenosis. The distal right vertebral artery is significantly non dominant. The basilar artery is patent without significant stenosis. The bilateral posterior cerebral arteries are patent without significant proximal stenosis. Venous sinuses: Within the limitations of contrast timing, no evidence of thrombosis. Anatomic variants: A small left posterior communicating artery is seen. The right  posterior communicating artery is poorly delineated and may be hypoplastic or absent. Partially azygos configuration of the anterior cerebral arteries as described Review of the MIP images confirms the above findings These results were communicated to Dr. Amada Jupiter at 3:29 pmon 9/1/2020by text page via the [facility] messaging system. IMPRESSION: CTA head: No large vessel occlusion or proximal high-grade arterial stenosis. CTA neck: Atherosclerotic disease as described. The bilateral common carotid, cervical internal carotid and vertebral arteries are patent within the neck without significant stenosis (50% or greater). Prominence of the main and right pulmonary artery suggestive of pulmonary hypertension. Pulmonary emphysema. Electronically Signed   By: Jackey Loge   On: 02/24/2019 15:30   Mr Brain Wo Contrast  Result Date: 02/24/2019 CLINICAL DATA:  Initial evaluation for acute altered mental status, syncope. EXAM: MRI HEAD WITHOUT CONTRAST TECHNIQUE: Multiplanar, multiecho pulse sequences of the brain and surrounding structures were obtained without intravenous contrast. COMPARISON:  Prior CTs from earlier same day. FINDINGS: Brain: Examination mildly degraded by motion artifact. Mild diffuse prominence of the CSF containing spaces compatible with generalized age-related cerebral atrophy. Scattered patchy T2/FLAIR hyperintensity within the periventricular deep white matter both cerebral hemispheres most consistent with chronic small vessel ischemic disease, mild in nature. Superimposed focus of  encephalomalacia and gliosis involving the posterior right frontal cortex compatible with a small chronic right MCA territory infarct (series 6, image 19). No abnormal foci of restricted diffusion to suggest acute or subacute ischemia. Gray-white matter differentiation maintained. No other areas of chronic cortical infarction. No acute or chronic intracranial hemorrhage. No mass lesion, midline shift or mass effect. No  hydrocephalus. Empty sella with prominent arachnoid pits at the sphenoid wings noted. Midline structures intact. Vascular: Major intracranial vascular flow voids are maintained. Skull and upper cervical spine: Craniocervical junction normal. Bone marrow signal intensity within normal limits. No scalp soft tissue abnormality. Sinuses/Orbits: Globes and orbital soft tissues demonstrate no acute finding. Patient status post bilateral ocular lens replacement. Paranasal sinuses are clear. No mastoid effusion. Inner ear structures normal. Other: None. IMPRESSION: 1. No acute intracranial abnormality. 2. Small chronic right posterior frontal cortical infarct. 3. Mild age-related cerebral atrophy with chronic microvascular ischemic disease. 4. Empty sella with prominent arachnoid pits at the sphenoid wings bilaterally. While these findings may be incidental in nature and of no clinical significance, changes such as these can also be seen in the setting of idiopathic intracranial hypertension (pseudotumor cerebri). Clinical correlation suggested. Electronically Signed   By: Rise Mu M.D.   On: 02/24/2019 23:16   Dg Chest Portable 1 View  Result Date: 02/24/2019 CLINICAL DATA:  Chest pain. EXAM: PORTABLE CHEST 1 VIEW COMPARISON:  May 15, 2018. FINDINGS: The heart size remains enlarged. There is no pneumothorax. There is generalized volume overload. There is atelectasis at the lung bases. No significant pleural effusion. No acute osseous abnormality. The pulmonary arteries appear significantly enlarged. IMPRESSION: 1. No acute cardiopulmonary process. 2. Cardiomegaly. 3. Significant enlargement of the pulmonary arteries which can be seen in patients with elevated pulmonary artery pressures. Electronically Signed   By: Katherine Mantle M.D.   On: 02/24/2019 16:34   Ct Head Code Stroke Wo Contrast  Result Date: 02/24/2019 CLINICAL DATA:  Code stroke. Focal neuro deficit less than 6 hours. Possible  stroke. Left facial droop. EXAM: CT HEAD WITHOUT CONTRAST TECHNIQUE: Contiguous axial images were obtained from the base of the skull through the vertex without intravenous contrast. COMPARISON:  CT head 06/24/2017 FINDINGS: Brain: Ventricle size and cerebral volume normal. Chronic infarct right parietal cortex. Mild chronic ischemia in the white matter unchanged. Negative for acute infarct, hemorrhage, mass.  No midline shift. Vascular: Negative for hyperdense vessel Skull: Negative Sinuses/Orbits: Paranasal sinuses clear.  Bilateral cataract surgery Other: None ASPECTS ([place] Stroke Program Early CT Score) - Ganglionic level infarction (caudate, lentiform nuclei, internal capsule, insula, M1-M3 cortex): 7 - Supraganglionic infarction (M4-M6 cortex): 3 Total score (0-10 with 10 being normal): 10 IMPRESSION: 1. No acute abnormality 2. ASPECTS is 10 3. These results were called by telephone at the time of interpretation on 02/24/2019 at 2:33 pm to Dr. Amada Jupiter , who verbally acknowledged these results. Electronically Signed   By: Marlan Palau M.D.   On: 02/24/2019 14:33   Cardiac Studies   ECHO: 02/11/2018 - Left ventricle: The cavity size was normal. Systolic function was normal. The estimated ejection fraction was in the range of 55% to 60%. Wall motion was normal; there were no regional wall motion abnormalities. Features are consistent with a pseudonormal left ventricular filling pattern, with concomitant abnormal relaxation and increased filling pressure (grade 2 diastolic dysfunction). - Ventricular septum: The contour showed systolic flattening. These changes are consistent with RV pressure overload. - Left atrium: The atrium was severely dilated. - Right ventricle:  The cavity size was moderately dilated. Wall thickness was normal. - Right atrium: The atrium was severely dilated. - Atrial septum: No defect or patent foramen ovale was identified. - Pulmonary arteries:  Systolic pressure was moderately increased. PA peak pressure: 67 mm Hg (S).  CATH: 05/16/2018  Normal coronary arteries without evidence of obstructive disease.  Upper normal LV systolic function, EF 70%. EDP 21 mmHg. Findings compatible with diastolic heart failure.  Brain MRI: IMPRESSION: 1. No acute intracranial abnormality. 2. Small chronic right posterior frontal cortical infarct. 3. Mild age-related cerebral atrophy with chronic microvascular ischemic disease. 4. Empty sella with prominent arachnoid pits at the sphenoid wings bilaterally. While these findings may be incidental in nature and of no clinical significance, changes such as these can also be seen in the setting of idiopathic intracranial hypertension (pseudotumor cerebri). Clinical correlation suggested.   Patient Profile     73 y.o. female   Assessment & Plan    1.  Syncope: - not obviously cardiac due to episode being prolonged and ?Postictal state upon waking - She has been seen by Neurology, but the diagnosis is not clear, brain MRI showe no acute findings, neuro increased lamotrigine - We will arrange for a 2 week Zio patch monitor and follow-up with Dr. Herbie Baltimore in 6-8 weeks. - pendig echo  2.  Chronic diastolic CHF: - Continue current meds, she is doing extremely well from a CHF standpoint.  3.  Chest pain: - She had a cardiac catheterization less than a year ago that showed no obstructive disease. -ECG is nonacute - Initial at bedtime troponin is negative - Unless cardiac enzymes become significantly elevated, no ischemic evaluation planned  [facility] if echo with no changes we will sign off.   Medication Recommendations:  As above Other recommendations (labs, testing, etc):  Zio patch - 2 weeks Follow up as an outpatient:  We will arrange  For questions or updates, please contact [facility] Please consult [email or web address] for contact info under   Signed, Tobias Alexander, MD   02/25/2019, 9:20 AM

## 2019-02-25 NOTE — Telephone Encounter (Signed)
Arrangements made for [employer] to mail a 14 day ZIO AT long term telemetry monitor to the patients home.  Instructions reviewed briefly as they are included in the monitor kit.

## 2019-02-25 NOTE — Progress Notes (Addendum)
NEUROLOGY PROGRESS NOTE  Subjective: At this point patient has no complaints.  Has not had any further seizures.  Exam: Vitals:   02/25/19 0900 02/25/19 0902  BP: (!) 145/76 134/64  Pulse:    Resp:    Temp:    SpO2:      Physical Exam   HEENT-  Normocephalic, no lesions, without obvious abnormality.  Normal external eye and conjunctiva.   Extremities- Warm, dry and intact Musculoskeletal-right knee pain-old, deformity or swelling Skin-warm and dry, no hyperpigmentation, vitiligo, or suspicious lesions    Neuro:  Mental Status: Alert, oriented, thought content appropriate.  Speech fluent without evidence of aphasia.  Able to follow 3 step commands without difficulty. Cranial Nerves: II:  Visual fields grossly normal,  III,IV, VI: ptosis not present, extra-ocular motions intact bilaterally pupils equal, round, reactive to light and accommodation V,VII: Slight resting left facial droop, facial light touch sensation normal bilaterally VIII: hearing normal bilaterally IX,X: Palate rises midline XI: bilateral shoulder shrug XII: midline tongue extension Motor: Right : Upper extremity   5/5    Left:     Upper extremity   5/5  Lower extremity   5/5     Lower extremity   5/5 Tone and bulk:normal tone throughout; no atrophy noted Sensory: Pinprick and light touch intact throughout, bilaterally     Medications:  Scheduled: . atorvastatin  10 mg Oral q1800  . B-complex with vitamin C  1 tablet Oral Daily  . cholecalciferol  1,000 Units Oral Daily  . furosemide  40 mg Oral Daily  . heparin  5,000 Units Subcutaneous Q8H  . hydrALAZINE  100 mg Oral BID  . insulin aspart  0-15 Units Subcutaneous TID WC  . insulin aspart  0-5 Units Subcutaneous QHS  . insulin glargine  34 Units Subcutaneous QHS  . isosorbide mononitrate  60 mg Oral Daily  . lamoTRIgine  125 mg Oral BID  . lipase/protease/amylase  48,000 Units Oral TID WC  . multivitamin with minerals  1 tablet Oral Daily  .  potassium chloride SA  40 mEq Oral BID  . sertraline  50 mg Oral Daily  . sodium chloride flush  3 mL Intravenous Once  . sodium chloride flush  3 mL Intravenous Q12H     Pertinent Labs/Diagnostics: -Lamictal level pending -Urine drug screen pending  Ct Angio Neck W Or Wo Contrast  Result Date: 02/24/2019  IMPRESSION: CTA head: No large vessel occlusion or proximal high-grade arterial stenosis. CTA neck: Atherosclerotic disease as described. The bilateral common carotid, cervical internal carotid and vertebral arteries are patent within the neck without significant stenosis (50% or greater). Prominence of the main and right pulmonary artery suggestive of pulmonary hypertension. Pulmonary emphysema. Electronically Signed   By: [name]   On: 02/24/2019 15:30   Mr Brain Wo Contrast  Result Date: 02/24/2019. IMPRESSION: 1. No acute intracranial abnormality. 2. Small chronic right posterior frontal cortical infarct. 3. Mild age-related cerebral atrophy with chronic microvascular ischemic disease. 4. Empty sella with prominent arachnoid pits at the sphenoid wings bilaterally. While these findings may be incidental in nature and of no clinical significance, changes such as these can also be seen in the setting of idiopathic intracranial hypertension (pseudotumor cerebri). Clinical correlation suggested. Electronically Signed   By: Jeannine Boga M.D.   On: 02/24/2019 23:16   Ct Head Code Stroke Wo Contrast  Result Date: 02/24/2019  IMPRESSION: 1. No acute abnormality 2. ASPECTS is 10 3. These results were called by telephone  at the time of interpretation on 02/24/2019 at 2:33 pm to Dr. Leonel Ramsay , who verbally acknowledged these results. Electronically Signed   By: Franchot Gallo M.D.   On: 02/24/2019 14:33   Echocardiogram did not show any abnormalities with an EF of 55% to 60% along with wall motion being normal.  She did have a grade 2 diastolic dysfunction.   Assessment: 73 year old  female with history of seizures presented with a 6-minute period of unresponsiveness followed by postictal state.  Seems more consistent with breakthrough seizure, although there was no motor manifestation.  At this point time we have increased her Lamictal dose to 125 twice daily.  Cardiology is following chest pain.  Patient has not had any further seizures.  MRI was obtained and did not show any acute abnormalities.    Recommendations: - Continue Lamictal at 125 mg twice daily -Awaiting Lamictal level -Patient is to follow-up with Dr. Delice Lesch when discharged - Seizure precautions have been discussed with patient.  Patient does not drive, she only take showers, and she denies climbing heights.  I discussed with her to not climb heights as she could fall.   Etta Quill PA-C [facility] [phone]  02/25/2019, 9:59 AM  I have seen the patient reviewed the above note.  She is back to baseline.  With a prolonged episode and postictal state, I think that increasing lamotrigine is reasonable.  She will need to follow-up with Dr. Delice Lesch as an outpatient.  Appreciate cardiology assistance.  No further recommendations at this time, please call with further questions or concerns.  Roland Rack, MD [facility] [phone]  If 7pm- 7am, please page neurology on call as listed in [place].

## 2019-02-25 NOTE — Progress Notes (Signed)
SLP Cancellation Note  Patient Details Name: [name] MRN: [id number] DOB: Oct 18, 1945   Cancelled treatment:       Reason Eval/Treat Not Completed: Other (comment) Pt has passed the [place] swallow screen and is on regular solids, thin liquids. RN says no overt difficulties with this diet. SLP to defer swallow evaluation at this time. Please reorder with any acute changes or if speech/language evaluation is wanted.    Venita Sheffield Jahmarion Popoff 02/25/2019, 11:23 AM  Pollyann Glen, M.A. [place] Acute Environmental education officer [phone] Office [phone]

## 2019-02-25 NOTE — Progress Notes (Signed)
EEG complete - results pending 

## 2019-02-25 NOTE — Progress Notes (Signed)
Patient continues to decline the use of a hospital provided CPAP machine.  Patient states that she slept fine without CPAP last night.  Her family member will not be able to bring her machine from home.  She will resume CPAP therapy after discharge home.

## 2019-02-25 NOTE — Progress Notes (Signed)
Dr. Meda Coffee asked me to arrange 2 week Zio and 4 week f/u appt. I sent message to office to arrange the Zio (live version) and also scheduled f/u with Arnold Long 10/6 in the office, appt placed on AVS. Melina Copa PA-C

## 2019-02-25 NOTE — Evaluation (Signed)
Physical Therapy Evaluation Patient Details Name: [name] MRN: [id number] DOB: Sep 30, 1945 Today's Date: 02/25/2019   History of Present Illness  Pt is a 73 y/o female who presents s/p syncopal episode while doing laundry at home. MRI negative for acute changes. PMH signficant PMH significant for seizures, peripheral neuropathy, Parkinson's disease, HTN, glaucoma, DM, diverticulosis, MI, COPD, CKD, chronic back pain.  Clinical Impression  Pt admitted with above diagnosis. At the time of PT eval pt was able to perform transfers and ambulation with gross min guard assist progressing to supervision for safety with SPC. Without AD, pt requiring min assist for balance support. Pt is likely near baseline of function, however baseline appears to be somewhat limited. Recommending HHPT follow-up to improve overall mobility status and decrease risk for falls at home. Pt currently with functional limitations due to the deficits listed below (see PT Problem List). Pt will benefit from skilled PT to increase their independence and safety with mobility to allow discharge to the venue listed below.       Follow Up Recommendations Home health PT;Supervision for mobility/OOB    Equipment Recommendations  None recommended by PT    Recommendations for Other Services       Precautions / Restrictions Precautions Precautions: Fall Restrictions Weight Bearing Restrictions: No      Mobility  Bed Mobility Overal bed mobility: Needs Assistance Bed Mobility: Supine to Sit;Sit to Supine     Supine to sit: Supervision Sit to supine: Supervision   General bed mobility comments: Increased time and use of rails required. HOB flat to simulate home environment.   Transfers Overall transfer level: Needs assistance Equipment used: None;Straight cane Transfers: Sit to/from Stand Sit to Stand: Min assist;Min guard         General transfer comment: Without AD, pt required min assist for balance support  and safety to gain/maintain balance. With SPC, pt was at a min guard level for stand>sit.   Ambulation/Gait Ambulation/Gait assistance: Min assist;Min guard;Supervision Gait Distance (Feet): 150 Feet Assistive device: None;1 person hand held assist;Straight cane Gait Pattern/deviations: Step-through pattern;Decreased stride length;Decreased weight shift to right Gait velocity: Decreased Gait velocity interpretation: <1.8 ft/sec, indicate of risk for recurrent falls General Gait Details: Initially pt with heavy lean and reach for external support. Min assist with HHA from therapist to walk to therapy gym. In gym, gave pt a SPC and she was able to improve to min guard to supervision by end of session.   Stairs Stairs: Yes Stairs assistance: Min guard Stair Management: One rail Left;Step to pattern;Forwards Number of Stairs: 5 General stair comments: VC's for sequencing and general safety  Wheelchair Mobility    Modified Rankin (Stroke Patients Only)       Balance Overall balance assessment: Needs assistance Sitting-balance support: Feet supported;No upper extremity supported Sitting balance-Leahy Scale: Good Sitting balance - Comments: Able to don socks but more difficulty on the right due to knee weakness   Standing balance support: Single extremity supported Standing balance-Leahy Scale: Poor Standing balance comment: without UE support pt reaching for external support                             Pertinent Vitals/Pain Pain Assessment: No/denies pain    Home Living Family/patient expects to be discharged to:: Private residence Living Arrangements: Other (Comment)(Lives with sister and brother-in-law) Available Help at Discharge: Family;Available 24 hours/day Type of Home: House Home Access: Stairs to enter Entrance Stairs-Rails:  Left Entrance Stairs-Number of Steps: 3 Home Layout: One level Home Equipment: Cane - single point;Bedside commode;Tub bench       Prior Function Level of Independence: Needs assistance   Gait / Transfers Assistance Needed: SPC use especially in community  ADL's / Homemaking Assistance Needed: Does her own ADLs.   Comments: Cooks breakfast occasionally, otherwise sister takes care of meals. Pt is able to intermittently      Hand Dominance   Dominant Hand: Right    Extremity/Trunk Assessment   Upper Extremity Assessment Upper Extremity Assessment: Defer to OT evaluation    Lower Extremity Assessment Lower Extremity Assessment: Generalized weakness;RLE deficits/detail RLE Deficits / Details: Pt reports long history of R knee pain/weakness    Cervical / Trunk Assessment Cervical / Trunk Assessment: Other exceptions Cervical / Trunk Exceptions: Forward head posture with rounded shoulders  Communication   Communication: No difficulties  Cognition Arousal/Alertness: Awake/alert Behavior During Therapy: WFL for tasks assessed/performed Overall Cognitive Status: Within Functional Limits for tasks assessed                                        General Comments      Exercises     Assessment/Plan    PT Assessment Patient needs continued PT services  PT Problem List Decreased strength;Decreased range of motion;Decreased activity tolerance;Decreased balance;Decreased mobility;Decreased knowledge of use of DME;Decreased safety awareness;Decreased knowledge of precautions       PT Treatment Interventions DME instruction;Functional mobility training;Therapeutic activities;Therapeutic exercise;Neuromuscular re-education;Patient/family education;Stair training;Gait training    PT Goals (Current goals can be found in the Care Plan section)  Acute Rehab PT Goals Patient Stated Goal: home today PT Goal Formulation: With patient Time For Goal Achievement: 03/04/19 Potential to Achieve Goals: Good    Frequency Min 3X/week   Barriers to discharge        Co-evaluation                AM-PAC PT "6 Clicks" Mobility  Outcome Measure Help needed turning from your back to your side while in a flat bed without using bedrails?: None Help needed moving from lying on your back to sitting on the side of a flat bed without using bedrails?: None Help needed moving to and from a bed to a chair (including a wheelchair)?: A Little Help needed standing up from a chair using your arms (e.g., wheelchair or bedside chair)?: A Little Help needed to walk in hospital room?: A Little Help needed climbing 3-5 steps with a railing? : A Little 6 Click Score: 20    End of Session Equipment Utilized During Treatment: Gait belt Activity Tolerance: Patient tolerated treatment well Patient left: with call bell/phone within reach;in bed(bed in chair position) Nurse Communication: Mobility status PT Visit Diagnosis: Unsteadiness on feet (R26.81);Other abnormalities of gait and mobility (R26.89)    Time: [id number] PT Time Calculation (min) (ACUTE ONLY): 24 min   Charges:   PT Evaluation $PT Eval Moderate Complexity: 1 Mod PT Treatments $Gait Training: 8-22 mins        Rolinda Roan, PT, DPT Acute Rehabilitation Services Pager: [phone] Office: [phone]   Thelma Comp 02/25/2019, 12:30 PM

## 2019-02-25 NOTE — Progress Notes (Addendum)
PROGRESS NOTE    [name]  [id number] DOB: [birth year] DOA: 02/24/2019 PCP: [name], MD  Brief Narrative: 73 year old female with history of COPD, nonobstructive CAD, type 2 diabetes mellitus, chronic diastolic CHF, morbid obesity, hypertension, history of seizure disorder, chronic pancreatitis presented after syncopal episode. -Patient was folding clothes yesterday afternoon, suddenly experienced a funny feeling and then slumped over and next thing remembers is that people were yelling her name trying to wake her up, sister reported that patient was unresponsive for at least 6 minutes and had some slurring of speech and confusion after she regained consciousness. -Work-up in the ED was unremarkable, CT head, EKG were unremarkable, chest x-ray showed cardiomegaly with significant enlargement of pulmonary arteries   Assessment & Plan:  Syncope and Collapse -Seizure suspected at this time due to history of remote seizures, prodrome followed by what seems like postictal phase -MRI negative for acute abnormality -Lamictal dose increased from 100 to 125 mg -Neurology following -EEG today -Physical therapy -Cannot completely rule out cardiogenic etiology as well, hence plan for monitor post discharge and echo today -Also check orthostatics, multiple medications which can cause peripheral vasodilation  Chest Pressure  -Resolved, high-sensitivity troponins are negative, prior left heart catheterization without significant coronary disease -Cardiology following, echo cardiogram today  Morbid obesity, OSA/OHS  -Continue CPAP nightly  -Continue Imdur and hydralazine  Chronic Diastolic CHF/Pulmonary Arterial Hypertension -Clinically appears euvolemic at this time -Continue carvedilol, hydralazine and Imdur -Restart Lasix  Hyperlipidemia -Continue statin  Uncontrolled Insulin-Dependent Diabetes Mellitus Type 2 -Insulin-dependent -Continue Lantus, sliding scale -Her  recent hemoglobin A1c was done and was 8.6  Seizure Disorder -See discussion above, continue Lamictal  Depression and Anxiety -Continue sertraline 50 mg p.o. daily  COPD currently not in exacerbation -Continue with home albuterol inhaler 1 to 2 puffs elations every 6 PRN for wheezing shortness of breath  Nonobstructive CAD and history of demand myocardial infarction -, Continue Coreg Imdur and statin  Morbid Obesity -Estimated body mass index is 43.03 kg/m as calculated from the following:   Height as of 01/21/19: 5\' 1"  (1.549 m).   Weight as of this encounter: 103.3 kg. -Weight Losts and Dietary Counseling given  Chronic Pancreatitis -Continue with pancreas replacement supplementation  Chronic Kidney Disease Stage II -Baseline creatinine appears to be around 0.9. -Patient's creatinine was slightly elevated on admission and was 1.15 -Resume Lasix at a lower dose  DVT prophylaxis: Heparin 5,000 units sq q8h Code Status: FULL CODE Family Communication: No family at bedside Disposition Plan:  Home tomorrow if stable  Consultants:   Cards  Neuro   Procedures:   Antimicrobials:    Subjective: -Feels better today, has some intermittent nausea which she attributes to her hydrocodone, felt lightheaded while trying to get up this morning -Mild dyspnea on exertion  Objective: Vitals:   02/25/19 0730 02/25/19 0900 02/25/19 0902 02/25/19 1133  BP: (!) 136/59 (!) 145/76 134/64 130/74  Pulse: 61   (!) 57  Resp: 16   18  Temp: 97.7 F (36.5 C)   98.8 F (37.1 C)  TempSrc: Oral   Oral  SpO2: 94%   96%  Weight:      Height:        Intake/Output Summary (Last 24 hours) at 02/25/2019 1315 Last data filed at 02/25/2019 0603 Gross per 24 hour  Intake --  Output 200 ml  Net -200 ml   Filed Weights   02/24/19 1400 02/24/19 2112 02/25/19 0500  Weight: 103.3 kg 100.7 kg  101.1 kg    Examination:  General exam: Obese chronically ill female, sitting up in bed,  AAO x3, no distress Respiratory system: Decreased breath sounds at both bases, otherwise clear Cardiovascular system: S1 & S2 heard, RRR Gastrointestinal system: Abdomen is nondistended, soft and nontender.Normal bowel sounds heard. Central nervous system: Alert and oriented. No focal neurological deficits. Extremities: no edema Skin: No rashes, lesions or ulcers Psychiatry: Judgement and insight appear normal    Data Reviewed:   CBC: Recent Labs  Lab 02/24/19 1423 02/24/19 1428 02/25/19 0332  WBC 4.9  --  5.3  NEUTROABS 2.6  --  3.1  HGB 11.8* 13.3 11.5*  HCT 37.2 39.0 36.6  MCV 74.4*  --  75.8*  PLT 283  --  [id number]   Basic Metabolic Panel: Recent Labs  Lab 02/24/19 1423 02/24/19 1428 02/25/19 0332  NA 134* 135 136  K 4.0 3.8 3.1*  CL 96* 96* 98  CO2 26  --  27  GLUCOSE 135* 132* 231*  BUN 18 24* 15  CREATININE 1.15* 1.10* 1.10*  CALCIUM 9.4  --  9.0  MG  --   --  1.9  PHOS  --   --  4.1   GFR: Estimated Creatinine Clearance: 51.9 mL/min (A) (by C-G formula based on SCr of 1.1 mg/dL (H)). Liver Function Tests: Recent Labs  Lab 02/24/19 1423 02/25/19 0332  AST 35 18  ALT 22 16  ALKPHOS 87 79  BILITOT 0.9 0.7  PROT 7.5 6.8  ALBUMIN 3.6 3.3*   No results for input(s): LIPASE, AMYLASE in the last 168 hours. No results for input(s): AMMONIA in the last 168 hours. Coagulation Profile: Recent Labs  Lab 02/24/19 1423  INR 1.1   Cardiac Enzymes: No results for input(s): CKTOTAL, CKMB, CKMBINDEX, TROPONINI in the last 168 hours. BNP (last 3 results) No results for input(s): PROBNP in the last 8760 hours. HbA1C: Recent Labs    02/24/19 1423  HGBA1C 8.5*   CBG: Recent Labs  Lab 02/24/19 1510 02/25/19 0034 02/25/19 0602 02/25/19 1131  GLUCAP 125* 77 184* 137*   Lipid Profile: No results for input(s): CHOL, HDL, LDLCALC, TRIG, CHOLHDL, LDLDIRECT in the last 72 hours. Thyroid Function Tests: Recent Labs    02/25/19 0332  TSH 1.332   Anemia  Panel: No results for input(s): VITAMINB12, FOLATE, FERRITIN, TIBC, IRON, RETICCTPCT in the last 72 hours. Urine analysis:    Component Value Date/Time   COLORURINE YELLOW 06/23/2017 2047   APPEARANCEUR CLEAR 06/23/2017 2047   LABSPEC 1.018 06/23/2017 2047   PHURINE 6.0 06/23/2017 2047   GLUCOSEU >=500 (A) 06/23/2017 2047   GLUCOSEU NEGATIVE 04/17/2016 0955   HGBUR NEGATIVE 06/23/2017 2047   BILIRUBINUR negative 10/22/2018 [place] 06/23/2017 2047   PROTEINUR Negative 10/22/2018 1243   PROTEINUR NEGATIVE 06/23/2017 2047   UROBILINOGEN 0.2 10/22/2018 1243   UROBILINOGEN 0.2 04/17/2016 0955   NITRITE negative 10/22/2018 1243   NITRITE NEGATIVE 06/23/2017 2047   LEUKOCYTESUR Negative 10/22/2018 1243   Sepsis Labs: @LABRCNTIP (procalcitonin:4,lacticidven:4)  ) Recent Results (from the past 240 hour(s))  SARS CORONAVIRUS 2 (TAT 6-24 HRS) Nasopharyngeal Nasopharyngeal Swab     Status: None   Collection Time: 02/24/19  4:26 PM   Specimen: Nasopharyngeal Swab  Result Value Ref Range Status   SARS Coronavirus 2 NEGATIVE NEGATIVE Final    Comment: (NOTE) SARS-CoV-2 target nucleic acids are NOT DETECTED. The SARS-CoV-2 RNA is generally detectable in upper and lower respiratory specimens during the acute  phase of infection. Negative results do not preclude SARS-CoV-2 infection, do not rule out co-infections with other pathogens, and should not be used as the sole basis for treatment or other patient management decisions. Negative results must be combined with clinical observations, patient history, and epidemiological information. The expected result is Negative. Fact Sheet for Patients: SugarRoll.be Fact Sheet for Healthcare Providers: https://www.woods-mathews.com/ This test is not yet approved or cleared by the [place] FDA and  has been authorized for detection and/or diagnosis of SARS-CoV-2 by FDA under an Emergency  Use Authorization (EUA). This EUA will remain  in effect (meaning this test can be used) for the duration of the COVID-19 declaration under Section 56 4(b)(1) of the Act, 21 U.S.C. section 360bbb-3(b)(1), unless the authorization is terminated or revoked sooner. Performed at [facility], [place] [address], [place], [place] [address]          Radiology Studies: Ct Angio Head W Or Wo Contrast  Result Date: 02/24/2019 CLINICAL DATA:  Left facial droop, aphasia EXAM: CT ANGIOGRAPHY HEAD AND NECK TECHNIQUE: Multidetector CT imaging of the head and neck was performed using the standard protocol during bolus administration of intravenous contrast. Multiplanar CT image reconstructions and MIPs were obtained to evaluate the vascular anatomy. Carotid stenosis measurements (when applicable) are obtained utilizing NASCET criteria, using the distal internal carotid diameter as the denominator. CONTRAST:  118mL OMNIPAQUE IOHEXOL 350 MG/ML SOLN COMPARISON:  Noncontrast head CT performed earlier the same day 02/24/2019, brain MRI 06/15/2017 FINDINGS: CTA NECK FINDINGS Aortic arch: Standard branching. Atherosclerotic calcification within the imaged aortic arch and major branch vessels. No high-grade narrowing of the major branch vessel origins. There is prominence of the main (3.4 cm in diameter) and right pulmonary artery, and findings are suggestive of pulmonary hypertension. Right carotid system: The right common and cervical internal carotid arteries are patent without significant stenosis (50% or greater. Mild atherosclerotic calcification at the carotid bifurcation. Partially retropharyngeal course of the right internal carotid artery. Left carotid system: The left common and cervical internal carotid arteries are patent without significant stenosis (50% or greater). Mild atherosclerotic calcification at the carotid bifurcation. Vertebral arteries: The bilateral vertebral arteries are patent within the  neck without significant stenosis (50% or greater) Skeleton: No acute bony abnormality. Cervical spondylosis without high-grade bony spinal canal stenosis. Prominent multilevel ventral osteophytes. Other neck: No soft tissue neck mass or pathologically enlarged cervical chain lymph nodes. Upper chest: Centrilobular and paraseptal emphysema within the imaged lung apices. Review of the MIP images confirms the above findings CTA HEAD FINDINGS Anterior circulation: The bilateral intracranial internal carotid arteries are patent without significant stenosis. Partially azygos configuration of the anterior cerebral arteries, arising from a dominant A1 left anterior cerebral artery, without significant proximal stenosis. The bilateral middle cerebral arteries are patent without significant proximal stenosis. No intracranial aneurysm is identified. Posterior circulation: The intracranial vertebral arteries are patent without significant stenosis. The distal right vertebral artery is significantly non dominant. The basilar artery is patent without significant stenosis. The bilateral posterior cerebral arteries are patent without significant proximal stenosis. Venous sinuses: Within the limitations of contrast timing, no evidence of thrombosis. Anatomic variants: A small left posterior communicating artery is seen. The right posterior communicating artery is poorly delineated and may be hypoplastic or absent. Partially azygos configuration of the anterior cerebral arteries as described Review of the MIP images confirms the above findings These results were communicated to Dr. Leonel Ramsay at 3:29 pmon 9/1/2020by text page via the [facility]  messaging system. IMPRESSION: CTA head: No large vessel occlusion or proximal high-grade arterial stenosis. CTA neck: Atherosclerotic disease as described. The bilateral common carotid, cervical internal carotid and vertebral arteries are patent within the neck without significant stenosis (50%  or greater). Prominence of the main and right pulmonary artery suggestive of pulmonary hypertension. Pulmonary emphysema. Electronically Signed   By: [name]   On: 02/24/2019 15:30   Ct Angio Neck W Or Wo Contrast  Result Date: 02/24/2019 CLINICAL DATA:  Left facial droop, aphasia EXAM: CT ANGIOGRAPHY HEAD AND NECK TECHNIQUE: Multidetector CT imaging of the head and neck was performed using the standard protocol during bolus administration of intravenous contrast. Multiplanar CT image reconstructions and MIPs were obtained to evaluate the vascular anatomy. Carotid stenosis measurements (when applicable) are obtained utilizing NASCET criteria, using the distal internal carotid diameter as the denominator. CONTRAST:  16mL OMNIPAQUE IOHEXOL 350 MG/ML SOLN COMPARISON:  Noncontrast head CT performed earlier the same day 02/24/2019, brain MRI 06/15/2017 FINDINGS: CTA NECK FINDINGS Aortic arch: Standard branching. Atherosclerotic calcification within the imaged aortic arch and major branch vessels. No high-grade narrowing of the major branch vessel origins. There is prominence of the main (3.4 cm in diameter) and right pulmonary artery, and findings are suggestive of pulmonary hypertension. Right carotid system: The right common and cervical internal carotid arteries are patent without significant stenosis (50% or greater. Mild atherosclerotic calcification at the carotid bifurcation. Partially retropharyngeal course of the right internal carotid artery. Left carotid system: The left common and cervical internal carotid arteries are patent without significant stenosis (50% or greater). Mild atherosclerotic calcification at the carotid bifurcation. Vertebral arteries: The bilateral vertebral arteries are patent within the neck without significant stenosis (50% or greater) Skeleton: No acute bony abnormality. Cervical spondylosis without high-grade bony spinal canal stenosis. Prominent multilevel ventral  osteophytes. Other neck: No soft tissue neck mass or pathologically enlarged cervical chain lymph nodes. Upper chest: Centrilobular and paraseptal emphysema within the imaged lung apices. Review of the MIP images confirms the above findings CTA HEAD FINDINGS Anterior circulation: The bilateral intracranial internal carotid arteries are patent without significant stenosis. Partially azygos configuration of the anterior cerebral arteries, arising from a dominant A1 left anterior cerebral artery, without significant proximal stenosis. The bilateral middle cerebral arteries are patent without significant proximal stenosis. No intracranial aneurysm is identified. Posterior circulation: The intracranial vertebral arteries are patent without significant stenosis. The distal right vertebral artery is significantly non dominant. The basilar artery is patent without significant stenosis. The bilateral posterior cerebral arteries are patent without significant proximal stenosis. Venous sinuses: Within the limitations of contrast timing, no evidence of thrombosis. Anatomic variants: A small left posterior communicating artery is seen. The right posterior communicating artery is poorly delineated and may be hypoplastic or absent. Partially azygos configuration of the anterior cerebral arteries as described Review of the MIP images confirms the above findings These results were communicated to Dr. Leonel Ramsay at 3:29 pmon 9/1/2020by text page via the [facility] messaging system. IMPRESSION: CTA head: No large vessel occlusion or proximal high-grade arterial stenosis. CTA neck: Atherosclerotic disease as described. The bilateral common carotid, cervical internal carotid and vertebral arteries are patent within the neck without significant stenosis (50% or greater). Prominence of the main and right pulmonary artery suggestive of pulmonary hypertension. Pulmonary emphysema. Electronically Signed   By: [name]   On: 02/24/2019 15:30    Mr Brain Wo Contrast  Result Date: 02/24/2019 CLINICAL DATA:  Initial evaluation for acute altered mental  status, syncope. EXAM: MRI HEAD WITHOUT CONTRAST TECHNIQUE: Multiplanar, multiecho pulse sequences of the brain and surrounding structures were obtained without intravenous contrast. COMPARISON:  Prior CTs from earlier same day. FINDINGS: Brain: Examination mildly degraded by motion artifact. Mild diffuse prominence of the CSF containing spaces compatible with generalized age-related cerebral atrophy. Scattered patchy T2/FLAIR hyperintensity within the periventricular deep white matter both cerebral hemispheres most consistent with chronic small vessel ischemic disease, mild in nature. Superimposed focus of encephalomalacia and gliosis involving the posterior right frontal cortex compatible with a small chronic right MCA territory infarct (series 6, image 19). No abnormal foci of restricted diffusion to suggest acute or subacute ischemia. Gray-white matter differentiation maintained. No other areas of chronic cortical infarction. No acute or chronic intracranial hemorrhage. No mass lesion, midline shift or mass effect. No hydrocephalus. Empty sella with prominent arachnoid pits at the sphenoid wings noted. Midline structures intact. Vascular: Major intracranial vascular flow voids are maintained. Skull and upper cervical spine: Craniocervical junction normal. Bone marrow signal intensity within normal limits. No scalp soft tissue abnormality. Sinuses/Orbits: Globes and orbital soft tissues demonstrate no acute finding. Patient status post bilateral ocular lens replacement. Paranasal sinuses are clear. No mastoid effusion. Inner ear structures normal. Other: None. IMPRESSION: 1. No acute intracranial abnormality. 2. Small chronic right posterior frontal cortical infarct. 3. Mild age-related cerebral atrophy with chronic microvascular ischemic disease. 4. Empty sella with prominent arachnoid pits at the  sphenoid wings bilaterally. While these findings may be incidental in nature and of no clinical significance, changes such as these can also be seen in the setting of idiopathic intracranial hypertension (pseudotumor cerebri). Clinical correlation suggested. Electronically Signed   By: Jeannine Boga M.D.   On: 02/24/2019 23:16   Dg Chest Portable 1 View  Result Date: 02/24/2019 CLINICAL DATA:  Chest pain. EXAM: PORTABLE CHEST 1 VIEW COMPARISON:  May 15, 2018. FINDINGS: The heart size remains enlarged. There is no pneumothorax. There is generalized volume overload. There is atelectasis at the lung bases. No significant pleural effusion. No acute osseous abnormality. The pulmonary arteries appear significantly enlarged. IMPRESSION: 1. No acute cardiopulmonary process. 2. Cardiomegaly. 3. Significant enlargement of the pulmonary arteries which can be seen in patients with elevated pulmonary artery pressures. Electronically Signed   By: Constance Holster M.D.   On: 02/24/2019 16:34   Ct Head Code Stroke Wo Contrast  Result Date: 02/24/2019 CLINICAL DATA:  Code stroke. Focal neuro deficit less than 6 hours. Possible stroke. Left facial droop. EXAM: CT HEAD WITHOUT CONTRAST TECHNIQUE: Contiguous axial images were obtained from the base of the skull through the vertex without intravenous contrast. COMPARISON:  CT head 06/24/2017 FINDINGS: Brain: Ventricle size and cerebral volume normal. Chronic infarct right parietal cortex. Mild chronic ischemia in the white matter unchanged. Negative for acute infarct, hemorrhage, mass.  No midline shift. Vascular: Negative for hyperdense vessel Skull: Negative Sinuses/Orbits: Paranasal sinuses clear.  Bilateral cataract surgery Other: None ASPECTS ([place] Stroke Program Early CT Score) - Ganglionic level infarction (caudate, lentiform nuclei, internal capsule, insula, M1-M3 cortex): 7 - Supraganglionic infarction (M4-M6 cortex): 3 Total score (0-10 with 10 being  normal): 10 IMPRESSION: 1. No acute abnormality 2. ASPECTS is 10 3. These results were called by telephone at the time of interpretation on 02/24/2019 at 2:33 pm to Dr. Leonel Ramsay , who verbally acknowledged these results. Electronically Signed   By: Franchot Gallo M.D.   On: 02/24/2019 14:33        Scheduled Meds:  atorvastatin  10 mg Oral q1800   B-complex with vitamin C  1 tablet Oral Daily   cholecalciferol  1,000 Units Oral Daily   furosemide  40 mg Oral Daily   heparin  5,000 Units Subcutaneous Q8H   hydrALAZINE  100 mg Oral BID   insulin aspart  0-15 Units Subcutaneous TID WC   insulin aspart  0-5 Units Subcutaneous QHS   insulin glargine  34 Units Subcutaneous QHS   isosorbide mononitrate  60 mg Oral Daily   lamoTRIgine  125 mg Oral BID   lipase/protease/amylase  48,000 Units Oral TID WC   multivitamin with minerals  1 tablet Oral Daily   potassium chloride SA  40 mEq Oral BID   sertraline  50 mg Oral Daily   sodium chloride flush  3 mL Intravenous Once   sodium chloride flush  3 mL Intravenous Q12H   Continuous Infusions:   LOS: 0 days    Time spent: 59min    Domenic Polite, MD [facility] Page via [email or web address], password [email or web address] After 7PM please contact night-coverage  02/25/2019, 1:15 PM

## 2019-02-25 NOTE — Progress Notes (Signed)
  Echocardiogram 2D Echocardiogram has been performed.  Jannett Celestine 02/25/2019, 11:39 AM

## 2019-02-25 NOTE — Procedures (Addendum)
Patient Name: [name]  MRN: [id number]  Epilepsy Attending: Lora Havens  Referring Physician/Provider: Dr Domenic Polite Date: 02/25/2019 Duration: 27.21 mins  Patient history: 73 year old female with history of seizure disorder who presented after a syncopal episode.  EEG to evaluate for seizures.  Level of alertness: awake, drowsy  AEDs during EEG study: lamotrigine  Technical aspects: This EEG study was done with scalp electrodes positioned according to the 10-20 International system of electrode placement. Electrical activity was acquired at a sampling rate of 500Hz  and reviewed with a high frequency filter of 70Hz  and a low frequency filter of 1Hz . EEG data were recorded continuously and digitally stored.   DESCRIPTION:  The posterior dominant rhythm consists of 9-10 Hz activity of moderate voltage (25-35 uV) seen predominantly in posterior head regions, symmetric and reactive to eye opening and eye closing.  Drowsiness was characterized by attenuation of the posterior background rhythm. Single sharp transient was seen in left frontotemporal region. Physiologic photic driving was seen during photic stimulation. Hyperventilation was not performed.  IMPRESSION: This study is within normal limits. No seizures or definite epileptiform discharges were seen throughout the recording. However, only wakefulness and drowsiness were recorded. If suspicion for interictal activity remains a concern, a prolonged study including sleep should be considered.    Tresia Revolorio Barbra Sarks

## 2019-02-26 DIAGNOSIS — K861 Other chronic pancreatitis: Secondary | ICD-10-CM | POA: Diagnosis not present

## 2019-02-26 DIAGNOSIS — I5032 Chronic diastolic (congestive) heart failure: Secondary | ICD-10-CM | POA: Diagnosis not present

## 2019-02-26 DIAGNOSIS — R55 Syncope and collapse: Secondary | ICD-10-CM | POA: Diagnosis not present

## 2019-02-26 LAB — BASIC METABOLIC PANEL
Anion gap: 10 (ref 5–15)
BUN: 15 mg/dL (ref 8–23)
CO2: 27 mmol/L (ref 22–32)
Calcium: 8.7 mg/dL — ABNORMAL LOW (ref 8.9–10.3)
Chloride: 97 mmol/L — ABNORMAL LOW (ref 98–111)
Creatinine, Ser: 0.97 mg/dL (ref 0.44–1.00)
GFR calc Af Amer: >60 mL/min (ref >60)
GFR calc non Af Amer: 59 mL/min — ABNORMAL LOW (ref >60)
Glucose, Bld: 176 mg/dL — ABNORMAL HIGH (ref 70–99)
Potassium: 3.8 mmol/L (ref 3.5–5.1)
Sodium: 134 mmol/L — ABNORMAL LOW (ref 135–145)

## 2019-02-26 LAB — GLUCOSE, CAPILLARY: Glucose-Capillary: 181 mg/dL — ABNORMAL HIGH (ref 70–99)

## 2019-02-26 LAB — LAMOTRIGINE LEVEL: Lamotrigine Lvl: 5.5 ug/mL (ref 2.0–20.0)

## 2019-02-26 MED ORDER — LAMOTRIGINE 25 MG PO TABS: 25.0000 mg | ORAL_TABLET | Freq: Two times a day (BID) | ORAL | 0 refills | Status: DC

## 2019-02-26 MED ORDER — LAMOTRIGINE 100 MG PO TABS: 100.0000 mg | ORAL_TABLET | Freq: Two times a day (BID) | ORAL | 0 refills | Status: DC

## 2019-02-26 NOTE — TOC Transition Note (Addendum)
Transition of Care [facility]) - CM/SW Discharge Note   Patient Details  Name: [name] MRN: [id number] Date of Birth: 04-Jun-1946  [facility] CM/SW Contact:  Geralynn Ochs, LCSW Phone Number: 02/26/2019, 10:20 AM   Clinical Narrative:   CSW met with patient to discuss patient's return home. Patient ready to go home. CSW discussed home health, provided CMS list, and patient said she has used [facility] before. No equipment needs. Her brother-in-law will pick her up when ready.   [facility] called back that they are unable to accept; CSW sent referral to [facility]. Explained to patient that [facility] would provide home health services and she is in agreement.    Final next level of care: [place] Barriers to Discharge: Barriers Resolved   Patient Goals and CMS Choice Patient states their goals for this hospitalization and ongoing recovery are:: to get back home asap CMS Medicare.gov Compare Post Acute Care list provided to:: Patient Choice offered to / list presented to : Patient  Discharge Placement                       Discharge Plan and Services     Post Acute Care Choice: Home Health                    HH Arranged: PT, OT [facility] Agency: [facility] Date [facility] Agency Contacted: 02/26/19 Time [place]: [phone] Representative spoke with at [place]: [place] ([place]) Interventions     Readmission Risk Interventions No flowsheet data found.

## 2019-02-26 NOTE — Progress Notes (Signed)
Physical Therapy Treatment Patient Details Name: [name] MRN: [id number] DOB: 1945-06-30 Today's Date: 02/26/2019    History of Present Illness Pt is a 73 y/o female who presents s/p syncopal episode while doing laundry at home. MRI negative for acute changes. PMH signficant PMH significant for seizures, peripheral neuropathy, Parkinson's disease, HTN, glaucoma, DM, diverticulosis, MI, COPD, CKD, chronic back pain.    PT Comments    Pt progressing well with mobility. She was able to ambulate without an AD in hall ~300 feet without overt LOB. Pt reports she feels she is at/near baseline of function. Still recommend HHPT to follow-up with pt as she has a history of falls and limited mobility in recent past. Pt agreeable to this. Will continue to follow.     Follow Up Recommendations  Home health PT;Supervision for mobility/OOB     Equipment Recommendations  None recommended by PT    Recommendations for Other Services       Precautions / Restrictions Precautions Precautions: Fall Restrictions Weight Bearing Restrictions: No    Mobility  Bed Mobility               General bed mobility comments: Pt sitting up in recliner upon PT arrival.   Transfers Overall transfer level: Modified independent Equipment used: None Transfers: Sit to/from Stand           General transfer comment: No assist required. No unsteadiness or LOB noted.  Ambulation/Gait Ambulation/Gait assistance: Modified independent (Device/Increase time) Gait Distance (Feet): 300 Feet Assistive device: None Gait Pattern/deviations: Step-through pattern;Decreased stride length;Decreased weight shift to right Gait velocity: Decreased Gait velocity interpretation: <1.8 ft/sec, indicate of risk for recurrent falls General Gait Details: Likely ambulating at baseline of function. Noted shorter step/stride length and wider BOS but overall no overt LOB noted.    Stairs             Wheelchair  Mobility    Modified Rankin (Stroke Patients Only)       Balance Overall balance assessment: Needs assistance Sitting-balance support: Feet supported;No upper extremity supported Sitting balance-Leahy Scale: Good     Standing balance support: Single extremity supported Standing balance-Leahy Scale: Fair                              Cognition Arousal/Alertness: Awake/alert Behavior During Therapy: WFL for tasks assessed/performed Overall Cognitive Status: Within Functional Limits for tasks assessed                                        Exercises      General Comments        Pertinent Vitals/Pain Pain Assessment: No/denies pain    Home Living                      Prior Function            PT Goals (current goals can now be found in the care plan section) Acute Rehab PT Goals Patient Stated Goal: home today PT Goal Formulation: With patient Time For Goal Achievement: 03/04/19 Potential to Achieve Goals: Good Progress towards PT goals: Progressing toward goals    Frequency    Min 3X/week      PT Plan Current plan remains appropriate    Co-evaluation  AM-PAC PT "6 Clicks" Mobility   Outcome Measure  Help needed turning from your back to your side while in a flat bed without using bedrails?: None Help needed moving from lying on your back to sitting on the side of a flat bed without using bedrails?: None Help needed moving to and from a bed to a chair (including a wheelchair)?: A Little Help needed standing up from a chair using your arms (e.g., wheelchair or bedside chair)?: A Little Help needed to walk in hospital room?: A Little Help needed climbing 3-5 steps with a railing? : A Little 6 Click Score: 20    End of Session Equipment Utilized During Treatment: Gait belt Activity Tolerance: Patient tolerated treatment well Patient left: with call bell/phone within reach;in chair Nurse  Communication: Mobility status PT Visit Diagnosis: Unsteadiness on feet (R26.81);Other abnormalities of gait and mobility (R26.89)     Time: [id number] PT Time Calculation (min) (ACUTE ONLY): 8 min  Charges:  $Gait Training: 8-22 mins                     Rolinda Roan, PT, DPT Acute Rehabilitation Services Pager: [phone] Office: [phone]    Thelma Comp 02/26/2019, 12:55 PM

## 2019-02-26 NOTE — Plan of Care (Signed)
  Problem: Education: Goal: Knowledge of disease or condition will improve Outcome: Adequate for Discharge Goal: Knowledge of secondary prevention will improve Outcome: Adequate for Discharge Goal: Knowledge of patient specific risk factors addressed and post discharge goals established will improve Outcome: Adequate for Discharge

## 2019-02-26 NOTE — Care Management Obs Status (Signed)
[place] NOTIFICATION   Patient Details  Name: [name] MRN: [id number] Date of Birth: 02-26-1946   Medicare Observation Status Notification Given:  Yes    Geralynn Ochs, LCSW 02/26/2019, 10:18 AM

## 2019-02-27 DIAGNOSIS — N182 Chronic kidney disease, stage 2 (mild): Secondary | ICD-10-CM

## 2019-02-27 DIAGNOSIS — I0981 Rheumatic heart failure: Secondary | ICD-10-CM

## 2019-02-27 DIAGNOSIS — E1122 Type 2 diabetes mellitus with diabetic chronic kidney disease: Secondary | ICD-10-CM

## 2019-02-27 DIAGNOSIS — E1142 Type 2 diabetes mellitus with diabetic polyneuropathy: Secondary | ICD-10-CM | POA: Diagnosis not present

## 2019-02-27 DIAGNOSIS — G20 Parkinson's disease: Secondary | ICD-10-CM

## 2019-02-27 DIAGNOSIS — I2721 Secondary pulmonary arterial hypertension: Secondary | ICD-10-CM

## 2019-02-27 DIAGNOSIS — I5032 Chronic diastolic (congestive) heart failure: Secondary | ICD-10-CM

## 2019-02-27 DIAGNOSIS — G40009 Localization-related (focal) (partial) idiopathic epilepsy and epileptic syndromes with seizures of localized onset, not intractable, without status epilepticus: Secondary | ICD-10-CM | POA: Diagnosis not present

## 2019-02-27 DIAGNOSIS — I69354 Hemiplegia and hemiparesis following cerebral infarction affecting left non-dominant side: Secondary | ICD-10-CM | POA: Diagnosis not present

## 2019-02-27 DIAGNOSIS — J432 Centrilobular emphysema: Secondary | ICD-10-CM

## 2019-02-27 DIAGNOSIS — I130 Hypertensive heart and chronic kidney disease with heart failure and stage 1 through stage 4 chronic kidney disease, or unspecified chronic kidney disease: Secondary | ICD-10-CM

## 2019-02-27 DIAGNOSIS — G8384 Todd's paralysis (postepileptic): Secondary | ICD-10-CM | POA: Diagnosis not present

## 2019-02-28 ENCOUNTER — Encounter: Payer: Self-pay | Admitting: Cardiology

## 2019-02-28 ENCOUNTER — Ambulatory Visit (INDEPENDENT_AMBULATORY_CARE_PROVIDER_SITE_OTHER): Payer: Medicare HMO

## 2019-02-28 ENCOUNTER — Other Ambulatory Visit: Payer: Self-pay | Admitting: Cardiology

## 2019-02-28 DIAGNOSIS — R55 Syncope and collapse: Secondary | ICD-10-CM

## 2019-02-28 NOTE — Discharge Summary (Signed)
Physician Discharge Summary  [name] MVE:[id number] DOB: 08/18/1945 DOA: 02/24/2019  PCP: [name], MD  Admit date: 02/24/2019 Discharge date: 02/28/2019  Time spent: 35 minutes  Recommendations for Outpatient Follow-up:  1. [facility] 10/6, event monitor 2. PCP Dr. Baird Cancer in 1 week 3. Neurology Dr. Ellouise Newer in 1 month   Discharge Diagnoses:  Syncope, suspected seizures   Obesity hypoventilation syndrome ([place])   Chronic pancreatitis ([place])   Hypertensive heart disease with chronic diastolic congestive heart failure (HCC)   Dyslipidemia, goal LDL below 100   Type II diabetes mellitus, uncontrolled (HCC)   Seizure disorder (HCC)   Chronic diastolic CHF (congestive heart failure), NYHA class 2 (HCC)   OSA (obstructive sleep apnea) - not on CPAP   Recurrent depression (HCC)   Chest pain   Type 2 diabetes mellitus with stage 2 chronic kidney disease, with long-term current use of insulin ([place])   Syncope   Discharge Condition: Improved  Diet recommendation: Diabetic, low-sodium heart healthy  Filed Weights   02/24/19 2112 02/25/19 0500 02/26/19 0500  Weight: 100.7 kg 101.1 kg 109 kg    History of present illness:  73 year old female with history of COPD, nonobstructive CAD, type 2 diabetes mellitus, chronic diastolic CHF, morbid obesity, hypertension, history of seizure disorder, chronic pancreatitis presented after syncopal episode. -Patient was folding clothes yesterday afternoon, suddenly experienced a funny feeling and then slumped over and next thing remembers is that people were yelling her name trying to wake her up, sister reported that patient was unresponsive for at least 6 minutes and had some slurring of speech and confusion after she regained consciousness. -Work-up in the ED was unremarkable  Hospital Course:   Syncope andCollapse -Seizure suspected at this time due to history of remote seizures, prodrome followed by what seems like postictal  phase -Orthostatics were negative -MRI negative for acute abnormality, EEG was negative for epileptiform activity -Seen by neurology in consultation, Dr. Leonel Ramsay recommended increasing Lamictal dose from 100 mg twice daily to 125 mg twice daily -Lamictal dose increased from 100 to 125 mg -Physical therapy evaluation completed, home health therapy recommended and set up at discharge -Cannot completely rule out cardiogenic etiology as well, hence she is also set up to have an event monitor post discharge  ChestPressure  -Resolved, high-sensitivity troponins are negative, prior left heart catheterization without significant coronary disease -Cardiology following, completed 2D echocardiogram showed preserved EF, RV dilation and dysfunction, felt to be secondary to underlying PAH, morbid obesity, OSA/OHS  Morbid obesity, OSA/OHS  -Continue CPAP nightly  -Continue Imdur and hydralazine  ChronicDiastolic CHF/PulmonaryArterialHypertension -Clinically appears euvolemic at this time -Continue carvedilol, hydralazine and Imdur -Resumed home regimen of Lasix  Hyperlipidemia -Continue statin  UncontrolledInsulin-Dependent DiabetesMellitusType 2 -Insulin-dependent -Continue Lantus, sliding scale -Her recent hemoglobin A1c was done and was 8.6  SeizureDisorder -See discussion above, continue Lamictal  Depression andAnxiety -Continue sertraline 50 mg p.o. daily  COPD  -Stable  Nonobstructive CAD and history of demand myocardial infarction -, Continue Coreg Imdur and statin  Morbid Obesity -Estimated body mass index is 43.03 kg/m as calculated from the following: Height as of 01/21/19: '5\' 1"'$  (1.549 m). Weight as of this encounter: 103.3 kg. -Weight Losts and Dietary Counseling given  ChronicPancreatitis -Continue with pancreas replacement supplementation  ChronicKidneyDiseaseStage II -Baseline creatinine appears to be around 0.9. -Stable -Resumed  Lasix    Discharge Exam: Vitals:   02/26/19 0500 02/26/19 0853  BP: (!) 129/58 (!) 154/82  Pulse: 63 61  Resp:    Temp: 98.3 F (36.8 C) 98.3 F (36.8 C)  SpO2: 97% 92%    General: Alert awake oriented x3 Cardiovascular: S1-S2/regular rate rhythm Respiratory: Clear bilaterally  Discharge Instructions   Discharge Instructions    Diet - low sodium heart healthy   Complete by: As directed    Increase activity slowly   Complete by: As directed      Allergies as of 02/26/2019      Reactions   Other Anaphylaxis, Rash   Bleach   Penicillins Hives   Has patient had a PCN reaction causing immediate rash, facial/tongue/throat swelling, SOB or lightheadedness with hypotension: Yes Has patient had a PCN reaction causing severe rash involving mucus membranes or skin necrosis: No Has patient had a PCN reaction that required hospitalization: No Has patient had a PCN reaction occurring within the last 10 years: No If all of the above answers are "NO", then may proceed with Cephalosporin use.   Sulfa Antibiotics Hives   Aspirin Other (See Comments)    On aspirin 81 mg - Rectal bleeding in dec 2018   Codeine    Headache and makes the patient feel "off"      Medication List    TAKE these medications   Accu-Chek Aviva Plus test strip Generic drug: glucose blood Use as instructed to check blood sugars 2 times per day dx: e11.22   Accu-Chek Aviva Plus w/Device Kit Use as directed to check blood sugars 2 times per day dx: e11.22   Accu-Chek Aviva Soln Use as directed dx: e11.22   Accu-Chek Softclix Lancets lancets Use as instructed to check blood sugars 2 times per day dx: e11.22   acetaminophen 500 MG tablet Commonly known as: TYLENOL Take 500 mg by mouth every 4 (four) hours as needed (for pain).   albuterol 108 (90 Base) MCG/ACT inhaler Commonly known as: VENTOLIN HFA Inhale 1-2 puffs into the lungs every 6 (six) hours as needed for wheezing or shortness of breath (DOE  WHEEZING SOB).   Alcohol Wipes 70 % Misc Apply 1 each topically 2 (two) times daily.   atorvastatin 10 MG tablet Commonly known as: LIPITOR TAKE 1 TABLET EVERY DAY   B-complex with vitamin C tablet Take 1 tablet by mouth daily.   carvedilol 25 MG tablet Commonly known as: COREG Take 1 tablet (25 mg total) by mouth 2 (two) times daily with a meal.   diclofenac sodium 1 % Gel Commonly known as: VOLTAREN Apply 2 g topically 4 (four) times daily as needed (pain).   Droplet Pen Needles 31G X 8 MM Misc Generic drug: Insulin Pen Needle USE DAILY WITH VICTOZA AND/OR NOVOLOG.   furosemide 80 MG tablet Commonly known as: LASIX Take one tablet (33m) by mouth Monday through Friday only. What changed:   how much to take  how to take this  when to take this   furosemide 40 MG tablet Commonly known as: LASIX Take one tablet (418m by mouth on Saturday and Sunday only. What changed:   how much to take  how to take this  when to take this   hydrALAZINE 100 MG tablet Commonly known as: APRESOLINE Take 100 mg twice a day , if blood pressure is greater than 150 mmhg take an extra tablet . What changed:   how much to take  how to take this  when to take this  reasons to take this  additional instructions   Insulin Glargine (1 Unit Dial) 300 UNIT/ML Sopn  Commonly known as: Toujeo SoloStar Inject 36 Units into the skin at bedtime. What changed: how much to take   isosorbide mononitrate 60 MG 24 hr tablet Commonly known as: IMDUR Take 1 tablet (60 mg total) by mouth daily.   lamoTRIgine 100 MG tablet Commonly known as: LAMICTAL Take 1 tablet (100 mg total) by mouth 2 (two) times daily. Take along with 3m for 1257mtwice a day of lamictal What changed: additional instructions   lamoTRIgine 25 MG tablet Commonly known as: LAMICTAL Take 1 tablet (25 mg total) by mouth 2 (two) times daily. Take along with 10065mID to make it 125m51mD What changed: You were  already taking a medication with the same name, and this prescription was added. Make sure you understand how and when to take each.   multivitamin with minerals tablet Take 1 tablet by mouth daily.   Ozempic (1 MG/DOSE) 2 MG/1.5ML Sopn Generic drug: Semaglutide (1 MG/DOSE) Inject 1 mg into the skin once a week.   potassium chloride SA 20 MEQ tablet Commonly known as: K-DUR TAKE 2 TABLETS ONCE DAILY. BUT ON THE DAY YOU  TAKE METOLAZONE AN  EXTRA TABLET FOR A TOTAL OF 3 TABLETS (60MEQ) What changed:   how much to take  how to take this  when to take this  additional instructions   sertraline 50 MG tablet Commonly known as: ZOLOFT TAKE 1 TABLET EVERY DAY What changed: when to take this   Vitamin D-3 25 MCG (1000 UT) Caps Take 1,000 Units by mouth daily.   Zenpep 25000-79000 units Cpep Generic drug: Pancrelipase (Lip-Prot-Amyl) Take 1-2 capsules by mouth See admin instructions. Take 2 capsules with meals and 1 capsule with snacks      Allergies  Allergen Reactions  . Other Anaphylaxis and Rash    Bleach  . Penicillins Hives    Has patient had a PCN reaction causing immediate rash, facial/tongue/throat swelling, SOB or lightheadedness with hypotension: Yes Has patient had a PCN reaction causing severe rash involving mucus membranes or skin necrosis: No Has patient had a PCN reaction that required hospitalization: No Has patient had a PCN reaction occurring within the last 10 years: No If all of the above answers are "NO", then may proceed with Cephalosporin use.   . Sulfa Antibiotics Hives  . Aspirin Other (See Comments)     On aspirin 81 mg - Rectal bleeding in dec 2018  . Codeine     Headache and makes the patient feel "off"   Follow-up Information    LawrLendon Colonel Follow up.   Specialties: Nurse Practitioner, Radiology, Cardiology Why: [facility] location - 03/31/19 at 1:15pm with KathCurt Bearsrse practitioner that works closely with Dr  HardEllyn Hackrive by 1pm). The office will also be calling you to arrange a 2-week heart monitor to wear. Contact information: [address] [phone]        SandGlendale Chard. Schedule an appointment as soon as possible for a visit in 1 week(s).   Specialty: Internal Medicine Contact information: [id number] [phone]        AquiCameron Sprang. Go in 1 month(s).   Specialty: Neurology Contact information: [address] [phone]        [facility]   Specialty: Home Health Services Why: Representative from [facility] reach out to you via telephone to schedule  the start of home health services. Contact information: [place] [address] [phone]            The results of significant diagnostics from this hospitalization (including imaging, microbiology, ancillary and laboratory) are listed below for reference.    Significant Diagnostic Studies: Ct Angio Head W Or Wo Contrast  Result Date: 02/24/2019 CLINICAL DATA:  Left facial droop, aphasia EXAM: CT ANGIOGRAPHY HEAD AND NECK TECHNIQUE: Multidetector CT imaging of the head and neck was performed using the standard protocol during bolus administration of intravenous contrast. Multiplanar CT image reconstructions and MIPs were obtained to evaluate the vascular anatomy. Carotid stenosis measurements (when applicable) are obtained utilizing NASCET criteria, using the distal internal carotid diameter as the denominator. CONTRAST:  173m OMNIPAQUE IOHEXOL 350 MG/ML SOLN COMPARISON:  Noncontrast head CT performed earlier the same day 02/24/2019, brain MRI 06/15/2017 FINDINGS: CTA NECK FINDINGS Aortic arch: Standard branching. Atherosclerotic calcification within the imaged aortic arch and major branch vessels. No high-grade narrowing of the major branch vessel  origins. There is prominence of the main (3.4 cm in diameter) and right pulmonary artery, and findings are suggestive of pulmonary hypertension. Right carotid system: The right common and cervical internal carotid arteries are patent without significant stenosis (50% or greater. Mild atherosclerotic calcification at the carotid bifurcation. Partially retropharyngeal course of the right internal carotid artery. Left carotid system: The left common and cervical internal carotid arteries are patent without significant stenosis (50% or greater). Mild atherosclerotic calcification at the carotid bifurcation. Vertebral arteries: The bilateral vertebral arteries are patent within the neck without significant stenosis (50% or greater) Skeleton: No acute bony abnormality. Cervical spondylosis without high-grade bony spinal canal stenosis. Prominent multilevel ventral osteophytes. Other neck: No soft tissue neck mass or pathologically enlarged cervical chain lymph nodes. Upper chest: Centrilobular and paraseptal emphysema within the imaged lung apices. Review of the MIP images confirms the above findings CTA HEAD FINDINGS Anterior circulation: The bilateral intracranial internal carotid arteries are patent without significant stenosis. Partially azygos configuration of the anterior cerebral arteries, arising from a dominant A1 left anterior cerebral artery, without significant proximal stenosis. The bilateral middle cerebral arteries are patent without significant proximal stenosis. No intracranial aneurysm is identified. Posterior circulation: The intracranial vertebral arteries are patent without significant stenosis. The distal right vertebral artery is significantly non dominant. The basilar artery is patent without significant stenosis. The bilateral posterior cerebral arteries are patent without significant proximal stenosis. Venous sinuses: Within the limitations of contrast timing, no evidence of thrombosis. Anatomic  variants: A small left posterior communicating artery is seen. The right posterior communicating artery is poorly delineated and may be hypoplastic or absent. Partially azygos configuration of the anterior cerebral arteries as described Review of the MIP images confirms the above findings These results were communicated to Dr. KLeonel Ramsayat 3:29 pmon 9/1/2020by text page via the [facility] system. IMPRESSION: CTA head: No large vessel occlusion or proximal high-grade arterial stenosis. CTA neck: Atherosclerotic disease as described. The bilateral common carotid, cervical internal carotid and vertebral arteries are patent within the neck without significant stenosis (50% or greater). Prominence of the main and right pulmonary artery suggestive of pulmonary hypertension. Pulmonary emphysema. Electronically Signed   By: KKellie Simmering  On: 02/24/2019 15:30   Ct Angio Neck W Or Wo Contrast  Result Date: 02/24/2019 CLINICAL DATA:  Left facial droop, aphasia EXAM: CT ANGIOGRAPHY HEAD AND NECK TECHNIQUE: Multidetector CT imaging of the head and neck was performed  using the standard protocol during bolus administration of intravenous contrast. Multiplanar CT image reconstructions and MIPs were obtained to evaluate the vascular anatomy. Carotid stenosis measurements (when applicable) are obtained utilizing NASCET criteria, using the distal internal carotid diameter as the denominator. CONTRAST:  150m OMNIPAQUE IOHEXOL 350 MG/ML SOLN COMPARISON:  Noncontrast head CT performed earlier the same day 02/24/2019, brain MRI 06/15/2017 FINDINGS: CTA NECK FINDINGS Aortic arch: Standard branching. Atherosclerotic calcification within the imaged aortic arch and major branch vessels. No high-grade narrowing of the major branch vessel origins. There is prominence of the main (3.4 cm in diameter) and right pulmonary artery, and findings are suggestive of pulmonary hypertension. Right carotid system: The right common and cervical  internal carotid arteries are patent without significant stenosis (50% or greater. Mild atherosclerotic calcification at the carotid bifurcation. Partially retropharyngeal course of the right internal carotid artery. Left carotid system: The left common and cervical internal carotid arteries are patent without significant stenosis (50% or greater). Mild atherosclerotic calcification at the carotid bifurcation. Vertebral arteries: The bilateral vertebral arteries are patent within the neck without significant stenosis (50% or greater) Skeleton: No acute bony abnormality. Cervical spondylosis without high-grade bony spinal canal stenosis. Prominent multilevel ventral osteophytes. Other neck: No soft tissue neck mass or pathologically enlarged cervical chain lymph nodes. Upper chest: Centrilobular and paraseptal emphysema within the imaged lung apices. Review of the MIP images confirms the above findings CTA HEAD FINDINGS Anterior circulation: The bilateral intracranial internal carotid arteries are patent without significant stenosis. Partially azygos configuration of the anterior cerebral arteries, arising from a dominant A1 left anterior cerebral artery, without significant proximal stenosis. The bilateral middle cerebral arteries are patent without significant proximal stenosis. No intracranial aneurysm is identified. Posterior circulation: The intracranial vertebral arteries are patent without significant stenosis. The distal right vertebral artery is significantly non dominant. The basilar artery is patent without significant stenosis. The bilateral posterior cerebral arteries are patent without significant proximal stenosis. Venous sinuses: Within the limitations of contrast timing, no evidence of thrombosis. Anatomic variants: A small left posterior communicating artery is seen. The right posterior communicating artery is poorly delineated and may be hypoplastic or absent. Partially azygos configuration of the  anterior cerebral arteries as described Review of the MIP images confirms the above findings These results were communicated to Dr. KLeonel Ramsayat 3:29 pmon 9/1/2020by text page via the [facility] system. IMPRESSION: CTA head: No large vessel occlusion or proximal high-grade arterial stenosis. CTA neck: Atherosclerotic disease as described. The bilateral common carotid, cervical internal carotid and vertebral arteries are patent within the neck without significant stenosis (50% or greater). Prominence of the main and right pulmonary artery suggestive of pulmonary hypertension. Pulmonary emphysema. Electronically Signed   By: KKellie Simmering  On: 02/24/2019 15:30   Mr Brain Wo Contrast  Result Date: 02/24/2019 CLINICAL DATA:  Initial evaluation for acute altered mental status, syncope. EXAM: MRI HEAD WITHOUT CONTRAST TECHNIQUE: Multiplanar, multiecho pulse sequences of the brain and surrounding structures were obtained without intravenous contrast. COMPARISON:  Prior CTs from earlier same day. FINDINGS: Brain: Examination mildly degraded by motion artifact. Mild diffuse prominence of the CSF containing spaces compatible with generalized age-related cerebral atrophy. Scattered patchy T2/FLAIR hyperintensity within the periventricular deep white matter both cerebral hemispheres most consistent with chronic small vessel ischemic disease, mild in nature. Superimposed focus of encephalomalacia and gliosis involving the posterior right frontal cortex compatible with a small chronic right MCA territory infarct (series 6, image 19). No abnormal foci of  restricted diffusion to suggest acute or subacute ischemia. Gray-white matter differentiation maintained. No other areas of chronic cortical infarction. No acute or chronic intracranial hemorrhage. No mass lesion, midline shift or mass effect. No hydrocephalus. Empty sella with prominent arachnoid pits at the sphenoid wings noted. Midline structures intact. Vascular:  Major intracranial vascular flow voids are maintained. Skull and upper cervical spine: Craniocervical junction normal. Bone marrow signal intensity within normal limits. No scalp soft tissue abnormality. Sinuses/Orbits: Globes and orbital soft tissues demonstrate no acute finding. Patient status post bilateral ocular lens replacement. Paranasal sinuses are clear. No mastoid effusion. Inner ear structures normal. Other: None. IMPRESSION: 1. No acute intracranial abnormality. 2. Small chronic right posterior frontal cortical infarct. 3. Mild age-related cerebral atrophy with chronic microvascular ischemic disease. 4. Empty sella with prominent arachnoid pits at the sphenoid wings bilaterally. While these findings may be incidental in nature and of no clinical significance, changes such as these can also be seen in the setting of idiopathic intracranial hypertension (pseudotumor cerebri). Clinical correlation suggested. Electronically Signed   By: Jeannine Boga M.D.   On: 02/24/2019 23:16   Dg Chest Portable 1 View  Result Date: 02/24/2019 CLINICAL DATA:  Chest pain. EXAM: PORTABLE CHEST 1 VIEW COMPARISON:  May 15, 2018. FINDINGS: The heart size remains enlarged. There is no pneumothorax. There is generalized volume overload. There is atelectasis at the lung bases. No significant pleural effusion. No acute osseous abnormality. The pulmonary arteries appear significantly enlarged. IMPRESSION: 1. No acute cardiopulmonary process. 2. Cardiomegaly. 3. Significant enlargement of the pulmonary arteries which can be seen in patients with elevated pulmonary artery pressures. Electronically Signed   By: Constance Holster M.D.   On: 02/24/2019 16:34   Ct Head Code Stroke Wo Contrast  Result Date: 02/24/2019 CLINICAL DATA:  Code stroke. Focal neuro deficit less than 6 hours. Possible stroke. Left facial droop. EXAM: CT HEAD WITHOUT CONTRAST TECHNIQUE: Contiguous axial images were obtained from the base of the  skull through the vertex without intravenous contrast. COMPARISON:  CT head 06/24/2017 FINDINGS: Brain: Ventricle size and cerebral volume normal. Chronic infarct right parietal cortex. Mild chronic ischemia in the white matter unchanged. Negative for acute infarct, hemorrhage, mass.  No midline shift. Vascular: Negative for hyperdense vessel Skull: Negative Sinuses/Orbits: Paranasal sinuses clear.  Bilateral cataract surgery Other: None ASPECTS ([place] Stroke Program Early CT Score) - Ganglionic level infarction (caudate, lentiform nuclei, internal capsule, insula, M1-M3 cortex): 7 - Supraganglionic infarction (M4-M6 cortex): 3 Total score (0-10 with 10 being normal): 10 IMPRESSION: 1. No acute abnormality 2. ASPECTS is 10 3. These results were called by telephone at the time of interpretation on 02/24/2019 at 2:33 pm to Dr. Leonel Ramsay , who verbally acknowledged these results. Electronically Signed   By: Franchot Gallo M.D.   On: 02/24/2019 14:33    Microbiology: Recent Results (from the past 240 hour(s))  SARS CORONAVIRUS 2 (TAT 6-24 HRS) Nasopharyngeal Nasopharyngeal Swab     Status: None   Collection Time: 02/24/19  4:26 PM   Specimen: Nasopharyngeal Swab  Result Value Ref Range Status   SARS Coronavirus 2 NEGATIVE NEGATIVE Final    Comment: (NOTE) SARS-CoV-2 target nucleic acids are NOT DETECTED. The SARS-CoV-2 RNA is generally detectable in upper and lower respiratory specimens during the acute phase of infection. Negative results do not preclude SARS-CoV-2 infection, do not rule out co-infections with other pathogens, and should not be used as the sole basis for treatment or other patient management decisions. Negative results must  be combined with clinical observations, patient history, and epidemiological information. The expected result is Negative. Fact Sheet for Patients: SugarRoll.be Fact Sheet for Healthcare  Providers: https://www.woods-mathews.com/ This test is not yet approved or cleared by the [place] FDA and  has been authorized for detection and/or diagnosis of SARS-CoV-2 by FDA under an Emergency Use Authorization (EUA). This EUA will remain  in effect (meaning this test can be used) for the duration of the COVID-19 declaration under Section 56 4(b)(1) of the Act, 21 U.S.C. section 360bbb-3(b)(1), unless the authorization is terminated or revoked sooner. Performed at [facility], [place] [address], [place], [place] [address]      Labs: Basic Metabolic Panel: Recent Labs  Lab 02/24/19 1423 02/24/19 1428 02/25/19 0332 02/26/19 0452  NA 134* 135 136 134*  K 4.0 3.8 3.1* 3.8  CL 96* 96* 98 97*  CO2 26  --  27 27  GLUCOSE 135* 132* 231* 176*  BUN 18 24* 15 15  CREATININE 1.15* 1.10* 1.10* 0.97  CALCIUM 9.4  --  9.0 8.7*  MG  --   --  1.9  --   PHOS  --   --  4.1  --    Liver Function Tests: Recent Labs  Lab 02/24/19 1423 02/25/19 0332  AST 35 18  ALT 22 16  ALKPHOS 87 79  BILITOT 0.9 0.7  PROT 7.5 6.8  ALBUMIN 3.6 3.3*   No results for input(s): LIPASE, AMYLASE in the last 168 hours. No results for input(s): AMMONIA in the last 168 hours. CBC: Recent Labs  Lab 02/24/19 1423 02/24/19 1428 02/25/19 0332  WBC 4.9  --  5.3  NEUTROABS 2.6  --  3.1  HGB 11.8* 13.3 11.5*  HCT 37.2 39.0 36.6  MCV 74.4*  --  75.8*  PLT 283  --  241   Cardiac Enzymes: No results for input(s): CKTOTAL, CKMB, CKMBINDEX, TROPONINI in the last 168 hours. BNP: BNP (last 3 results) Recent Labs    05/15/18 0118  BNP 153.9*    ProBNP (last 3 results) No results for input(s): PROBNP in the last 8760 hours.  CBG: Recent Labs  Lab 02/25/19 0602 02/25/19 1131 02/25/19 1616 02/25/19 2049 02/26/19 0639  GLUCAP 184* 137* 143* 131* 181*       Signed:  Domenic Polite MD.  [facility] 02/28/2019, 3:05 PM

## 2019-03-03 ENCOUNTER — Telehealth: Payer: Self-pay

## 2019-03-03 DIAGNOSIS — I5032 Chronic diastolic (congestive) heart failure: Secondary | ICD-10-CM | POA: Diagnosis not present

## 2019-03-03 DIAGNOSIS — J432 Centrilobular emphysema: Secondary | ICD-10-CM | POA: Diagnosis not present

## 2019-03-03 DIAGNOSIS — E1142 Type 2 diabetes mellitus with diabetic polyneuropathy: Secondary | ICD-10-CM | POA: Diagnosis not present

## 2019-03-03 DIAGNOSIS — I69354 Hemiplegia and hemiparesis following cerebral infarction affecting left non-dominant side: Secondary | ICD-10-CM | POA: Diagnosis not present

## 2019-03-03 DIAGNOSIS — G8384 Todd's paralysis (postepileptic): Secondary | ICD-10-CM | POA: Diagnosis not present

## 2019-03-03 DIAGNOSIS — I0981 Rheumatic heart failure: Secondary | ICD-10-CM | POA: Diagnosis not present

## 2019-03-03 DIAGNOSIS — G20 Parkinson's disease: Secondary | ICD-10-CM | POA: Diagnosis not present

## 2019-03-03 DIAGNOSIS — I130 Hypertensive heart and chronic kidney disease with heart failure and stage 1 through stage 4 chronic kidney disease, or unspecified chronic kidney disease: Secondary | ICD-10-CM | POA: Diagnosis not present

## 2019-03-03 DIAGNOSIS — G40009 Localization-related (focal) (partial) idiopathic epilepsy and epileptic syndromes with seizures of localized onset, not intractable, without status epilepticus: Secondary | ICD-10-CM | POA: Diagnosis not present

## 2019-03-03 NOTE — Progress Notes (Signed)
  Chronic Care Management   Outreach Note  03/03/2019 Name: [name] MRN: [id number] DOB: 24-Oct-1945  Referred by: [name], MD Reason for referral : Chronic Care Management   An unsuccessful telephone outreach was attempted today. The patient was referred to the case management team by for assistance with chronic care management and care coordination.   Follow Up Plan: A HIPPA compliant phone message was left for the patient providing contact information and requesting a return call.  The care management team will reach out to the patient again over the next 2 weeks.  [name], PharmD, BCPS Clinical Pharmacist, [facility] [place]: [phone]

## 2019-03-03 NOTE — Telephone Encounter (Signed)
Called pt to do TOC call. left message no answer

## 2019-03-05 ENCOUNTER — Ambulatory Visit (INDEPENDENT_AMBULATORY_CARE_PROVIDER_SITE_OTHER): Payer: Medicare HMO | Admitting: Internal Medicine

## 2019-03-05 ENCOUNTER — Ambulatory Visit: Payer: Self-pay | Admitting: *Deleted

## 2019-03-05 ENCOUNTER — Telehealth: Payer: Self-pay | Admitting: Cardiology

## 2019-03-05 ENCOUNTER — Encounter: Payer: Self-pay | Admitting: Internal Medicine

## 2019-03-05 ENCOUNTER — Other Ambulatory Visit: Payer: Self-pay

## 2019-03-05 VITALS — BP 120/60 | HR 60 | Temp 98.6°F | Ht 59.4 in | Wt 224.2 lb

## 2019-03-05 DIAGNOSIS — M2550 Pain in unspecified joint: Secondary | ICD-10-CM | POA: Diagnosis not present

## 2019-03-05 DIAGNOSIS — F339 Major depressive disorder, recurrent, unspecified: Secondary | ICD-10-CM | POA: Diagnosis not present

## 2019-03-05 DIAGNOSIS — Z6841 Body Mass Index (BMI) 40.0 and over, adult: Secondary | ICD-10-CM | POA: Diagnosis not present

## 2019-03-05 DIAGNOSIS — R55 Syncope and collapse: Secondary | ICD-10-CM | POA: Diagnosis not present

## 2019-03-05 DIAGNOSIS — Z09 Encounter for follow-up examination after completed treatment for conditions other than malignant neoplasm: Secondary | ICD-10-CM

## 2019-03-05 DIAGNOSIS — J449 Chronic obstructive pulmonary disease, unspecified: Secondary | ICD-10-CM | POA: Diagnosis not present

## 2019-03-05 DIAGNOSIS — G4733 Obstructive sleep apnea (adult) (pediatric): Secondary | ICD-10-CM | POA: Diagnosis not present

## 2019-03-05 DIAGNOSIS — G40909 Epilepsy, unspecified, not intractable, without status epilepticus: Secondary | ICD-10-CM | POA: Diagnosis not present

## 2019-03-05 NOTE — Telephone Encounter (Signed)
Per call from stacy with Cardiovascular consulting Dr [facility] office needs to speak to someone about this case that is about to expire

## 2019-03-05 NOTE — Patient Instructions (Signed)
Increase Zoloft 50mg  - 1-1/2 tablet daily.

## 2019-03-05 NOTE — Telephone Encounter (Signed)
Spoke to [place] ,nurse for Dr Uvaldo Bristle -  prior Authorization request will e expiring tody - if information is not gvien  For  Event monitor request.   RN  Gave information for  Recent hospitalization  Orders  patient arrived at hosptila fo syncopal episode.  eafiler  Discharge summary was faxed to  [phone] " health port" tracking [id number]  per Erline Levine information will be given to Dr Uvaldo Bristle - for approval and approval number will be faxed

## 2019-03-06 DIAGNOSIS — J432 Centrilobular emphysema: Secondary | ICD-10-CM | POA: Diagnosis not present

## 2019-03-06 DIAGNOSIS — G20 Parkinson's disease: Secondary | ICD-10-CM | POA: Diagnosis not present

## 2019-03-06 DIAGNOSIS — E1142 Type 2 diabetes mellitus with diabetic polyneuropathy: Secondary | ICD-10-CM | POA: Diagnosis not present

## 2019-03-06 DIAGNOSIS — I130 Hypertensive heart and chronic kidney disease with heart failure and stage 1 through stage 4 chronic kidney disease, or unspecified chronic kidney disease: Secondary | ICD-10-CM | POA: Diagnosis not present

## 2019-03-06 DIAGNOSIS — I69354 Hemiplegia and hemiparesis following cerebral infarction affecting left non-dominant side: Secondary | ICD-10-CM | POA: Diagnosis not present

## 2019-03-06 DIAGNOSIS — I0981 Rheumatic heart failure: Secondary | ICD-10-CM | POA: Diagnosis not present

## 2019-03-06 DIAGNOSIS — G40009 Localization-related (focal) (partial) idiopathic epilepsy and epileptic syndromes with seizures of localized onset, not intractable, without status epilepticus: Secondary | ICD-10-CM | POA: Diagnosis not present

## 2019-03-06 DIAGNOSIS — G8384 Todd's paralysis (postepileptic): Secondary | ICD-10-CM | POA: Diagnosis not present

## 2019-03-06 DIAGNOSIS — I5032 Chronic diastolic (congestive) heart failure: Secondary | ICD-10-CM | POA: Diagnosis not present

## 2019-03-09 DIAGNOSIS — G40009 Localization-related (focal) (partial) idiopathic epilepsy and epileptic syndromes with seizures of localized onset, not intractable, without status epilepticus: Secondary | ICD-10-CM | POA: Diagnosis not present

## 2019-03-09 DIAGNOSIS — E1142 Type 2 diabetes mellitus with diabetic polyneuropathy: Secondary | ICD-10-CM | POA: Diagnosis not present

## 2019-03-09 DIAGNOSIS — I0981 Rheumatic heart failure: Secondary | ICD-10-CM | POA: Diagnosis not present

## 2019-03-09 DIAGNOSIS — G20 Parkinson's disease: Secondary | ICD-10-CM | POA: Diagnosis not present

## 2019-03-09 DIAGNOSIS — J432 Centrilobular emphysema: Secondary | ICD-10-CM | POA: Diagnosis not present

## 2019-03-09 DIAGNOSIS — I5032 Chronic diastolic (congestive) heart failure: Secondary | ICD-10-CM | POA: Diagnosis not present

## 2019-03-09 DIAGNOSIS — G8384 Todd's paralysis (postepileptic): Secondary | ICD-10-CM | POA: Diagnosis not present

## 2019-03-09 DIAGNOSIS — I130 Hypertensive heart and chronic kidney disease with heart failure and stage 1 through stage 4 chronic kidney disease, or unspecified chronic kidney disease: Secondary | ICD-10-CM | POA: Diagnosis not present

## 2019-03-09 DIAGNOSIS — I69354 Hemiplegia and hemiparesis following cerebral infarction affecting left non-dominant side: Secondary | ICD-10-CM | POA: Diagnosis not present

## 2019-03-10 ENCOUNTER — Telehealth: Payer: Self-pay

## 2019-03-10 NOTE — Telephone Encounter (Signed)
I have called patient to see if she is taking her atorvastatin as directed the pharmacy sent over a letter stating she may not be taking it. YRL,RMA

## 2019-03-11 ENCOUNTER — Ambulatory Visit: Payer: Self-pay

## 2019-03-11 DIAGNOSIS — E1122 Type 2 diabetes mellitus with diabetic chronic kidney disease: Secondary | ICD-10-CM

## 2019-03-11 DIAGNOSIS — N182 Chronic kidney disease, stage 2 (mild): Secondary | ICD-10-CM

## 2019-03-11 DIAGNOSIS — I5032 Chronic diastolic (congestive) heart failure: Secondary | ICD-10-CM

## 2019-03-11 NOTE — Chronic Care Management (AMB) (Signed)
Chronic Care Management    Social Work Follow Up Note  03/11/2019 Name: [name] MRN: [id number] DOB: 06-19-1946  SW placed a follow up call to the patient to assess for SW needs. The patient continues to decline resource needs.   SDOH (Social Determinants of Health) screening performed today: None.   Outpatient Encounter Medications as of 03/11/2019  Medication Sig Note  . Accu-Chek Softclix Lancets lancets Use as instructed to check blood sugars 2 times per day dx: e11.22   . acetaminophen (TYLENOL) 500 MG tablet Take 500 mg by mouth every 4 (four) hours as needed (for pain).    Marland Kitchen albuterol (PROVENTIL HFA;VENTOLIN HFA) 108 (90 Base) MCG/ACT inhaler Inhale 1-2 puffs into the lungs every 6 (six) hours as needed for wheezing or shortness of breath (DOE WHEEZING SOB).   Marland Kitchen atorvastatin (LIPITOR) 10 MG tablet TAKE 1 TABLET EVERY DAY (Patient taking differently: Take 10 mg by mouth daily. )   . B Complex-C (B-COMPLEX WITH VITAMIN C) tablet Take 1 tablet by mouth daily.   . Blood Glucose Calibration (ACCU-CHEK AVIVA) SOLN Use as directed dx: e11.22   . Blood Glucose Monitoring Suppl (ACCU-CHEK AVIVA PLUS) w/Device KIT Use as directed to check blood sugars 2 times per day dx: e11.22   . carvedilol (COREG) 25 MG tablet Take 1 tablet (25 mg total) by mouth 2 (two) times daily with a meal.   . Cholecalciferol (VITAMIN D-3) 1000 units CAPS Take 1,000 Units by mouth daily.   . diclofenac sodium (VOLTAREN) 1 % GEL Apply 2 g topically 4 (four) times daily as needed (pain).   . DROPLET PEN NEEDLES 31G X 8 MM MISC USE DAILY WITH VICTOZA AND/OR NOVOLOG.    . furosemide (LASIX) 40 MG tablet Take one tablet (8m) by mouth on Saturday and Sunday only. (Patient taking differently: Take 40 mg by mouth See admin instructions. Take one tablet (411m by mouth on Saturday and Sunday only.)   . furosemide (LASIX) 80 MG tablet Take one tablet (8059mby mouth Monday through Friday only. (Patient taking  differently: Take 80 mg by mouth See admin instructions. Take one tablet (19m78my mouth Monday through Friday only.)   . glucose blood (ACCU-CHEK AVIVA PLUS) test strip Use as instructed to check blood sugars 2 times per day dx: e11.22   . hydrALAZINE (APRESOLINE) 100 MG tablet Take 100 mg twice a day , if blood pressure is greater than 150 mmhg take an extra tablet . (Patient taking differently: Take 100 mg by mouth daily as needed. Take 100 mg twice a day as needed if blood pressure is greater than 150 mmhg take an extra tablet .)   . Insulin Glargine, 1 Unit Dial, (TOUJEO SOLOSTAR) 300 UNIT/ML SOPN Inject 36 Units into the skin at bedtime. (Patient taking differently: Inject 34 Units into the skin at bedtime. )   . Isopropyl Alcohol (ALCOHOL WIPES) 70 % MISC Apply 1 each topically 2 (two) times daily.   . isosorbide mononitrate (IMDUR) 60 MG 24 hr tablet Take 1 tablet (60 mg total) by mouth daily.   . laMarland KitchenoTRIgine (LAMICTAL) 100 MG tablet Take 1 tablet (100 mg total) by mouth 2 (two) times daily. Take along with 25mg60m 125mg 69me a day of lamictal   . lamoTRIgine (LAMICTAL) 25 MG tablet Take 1 tablet (25 mg total) by mouth 2 (two) times daily. Take along with 100mg B72mo make it 125mg BI71m. Multiple Vitamins-Minerals (MULTIVITAMIN WITH MINERALS) tablet Take  1 tablet by mouth daily.   . Pancrelipase, Lip-Prot-Amyl, (ZENPEP) 25000-79000 units CPEP Take 1-2 capsules by mouth See admin instructions. Take 2 capsules with meals and 1 capsule with snacks   . potassium chloride SA (K-DUR) 20 MEQ tablet TAKE 2 TABLETS ONCE DAILY, BUT ON THE DAY YOU  TAKE METOLAZONE TAKE AN EXTRA TABLET FOR A TOTAL OF 3 TABLETS   . Semaglutide, 1 MG/DOSE, (OZEMPIC, 1 MG/DOSE,) 2 MG/1.5ML SOPN Inject 1 mg into the skin once a week. 02/24/2019: Take on Thursdays per patient   . sertraline (ZOLOFT) 50 MG tablet TAKE 1 TABLET EVERY DAY (Patient taking differently: Take 50 mg by mouth at bedtime. )    No facility-administered  encounter medications on file as of 03/11/2019.       Follow Up Plan: No further SW follow up planned at this time. The patient will remain engaged by embedded PharmD and RN Case Manager.   Daneen Schick, BSW, CDP Social Worker, Certified Dementia Practitioner [place] [facility] [phone]

## 2019-03-12 DIAGNOSIS — E1142 Type 2 diabetes mellitus with diabetic polyneuropathy: Secondary | ICD-10-CM | POA: Diagnosis not present

## 2019-03-12 DIAGNOSIS — I5032 Chronic diastolic (congestive) heart failure: Secondary | ICD-10-CM | POA: Diagnosis not present

## 2019-03-12 DIAGNOSIS — I130 Hypertensive heart and chronic kidney disease with heart failure and stage 1 through stage 4 chronic kidney disease, or unspecified chronic kidney disease: Secondary | ICD-10-CM | POA: Diagnosis not present

## 2019-03-12 DIAGNOSIS — G8384 Todd's paralysis (postepileptic): Secondary | ICD-10-CM | POA: Diagnosis not present

## 2019-03-12 DIAGNOSIS — J432 Centrilobular emphysema: Secondary | ICD-10-CM | POA: Diagnosis not present

## 2019-03-12 DIAGNOSIS — I69354 Hemiplegia and hemiparesis following cerebral infarction affecting left non-dominant side: Secondary | ICD-10-CM | POA: Diagnosis not present

## 2019-03-12 DIAGNOSIS — G20 Parkinson's disease: Secondary | ICD-10-CM | POA: Diagnosis not present

## 2019-03-12 DIAGNOSIS — G40009 Localization-related (focal) (partial) idiopathic epilepsy and epileptic syndromes with seizures of localized onset, not intractable, without status epilepticus: Secondary | ICD-10-CM | POA: Diagnosis not present

## 2019-03-12 DIAGNOSIS — I0981 Rheumatic heart failure: Secondary | ICD-10-CM | POA: Diagnosis not present

## 2019-03-17 DIAGNOSIS — I130 Hypertensive heart and chronic kidney disease with heart failure and stage 1 through stage 4 chronic kidney disease, or unspecified chronic kidney disease: Secondary | ICD-10-CM | POA: Diagnosis not present

## 2019-03-17 DIAGNOSIS — J432 Centrilobular emphysema: Secondary | ICD-10-CM | POA: Diagnosis not present

## 2019-03-17 DIAGNOSIS — I0981 Rheumatic heart failure: Secondary | ICD-10-CM | POA: Diagnosis not present

## 2019-03-17 DIAGNOSIS — G20 Parkinson's disease: Secondary | ICD-10-CM | POA: Diagnosis not present

## 2019-03-17 DIAGNOSIS — G40009 Localization-related (focal) (partial) idiopathic epilepsy and epileptic syndromes with seizures of localized onset, not intractable, without status epilepticus: Secondary | ICD-10-CM | POA: Diagnosis not present

## 2019-03-17 DIAGNOSIS — I69354 Hemiplegia and hemiparesis following cerebral infarction affecting left non-dominant side: Secondary | ICD-10-CM | POA: Diagnosis not present

## 2019-03-17 DIAGNOSIS — G8384 Todd's paralysis (postepileptic): Secondary | ICD-10-CM | POA: Diagnosis not present

## 2019-03-17 DIAGNOSIS — E1142 Type 2 diabetes mellitus with diabetic polyneuropathy: Secondary | ICD-10-CM | POA: Diagnosis not present

## 2019-03-17 DIAGNOSIS — I5032 Chronic diastolic (congestive) heart failure: Secondary | ICD-10-CM | POA: Diagnosis not present

## 2019-03-19 DIAGNOSIS — I5032 Chronic diastolic (congestive) heart failure: Secondary | ICD-10-CM | POA: Diagnosis not present

## 2019-03-19 DIAGNOSIS — E1142 Type 2 diabetes mellitus with diabetic polyneuropathy: Secondary | ICD-10-CM | POA: Diagnosis not present

## 2019-03-19 DIAGNOSIS — I69354 Hemiplegia and hemiparesis following cerebral infarction affecting left non-dominant side: Secondary | ICD-10-CM | POA: Diagnosis not present

## 2019-03-19 DIAGNOSIS — G40009 Localization-related (focal) (partial) idiopathic epilepsy and epileptic syndromes with seizures of localized onset, not intractable, without status epilepticus: Secondary | ICD-10-CM | POA: Diagnosis not present

## 2019-03-19 DIAGNOSIS — G20 Parkinson's disease: Secondary | ICD-10-CM | POA: Diagnosis not present

## 2019-03-19 DIAGNOSIS — I0981 Rheumatic heart failure: Secondary | ICD-10-CM | POA: Diagnosis not present

## 2019-03-19 DIAGNOSIS — I130 Hypertensive heart and chronic kidney disease with heart failure and stage 1 through stage 4 chronic kidney disease, or unspecified chronic kidney disease: Secondary | ICD-10-CM | POA: Diagnosis not present

## 2019-03-19 DIAGNOSIS — G8384 Todd's paralysis (postepileptic): Secondary | ICD-10-CM | POA: Diagnosis not present

## 2019-03-19 DIAGNOSIS — J432 Centrilobular emphysema: Secondary | ICD-10-CM | POA: Diagnosis not present

## 2019-03-23 ENCOUNTER — Encounter: Payer: Self-pay | Admitting: Neurology

## 2019-03-23 ENCOUNTER — Ambulatory Visit (INDEPENDENT_AMBULATORY_CARE_PROVIDER_SITE_OTHER): Payer: Medicare HMO | Admitting: Neurology

## 2019-03-23 ENCOUNTER — Telehealth: Payer: Self-pay | Admitting: Cardiology

## 2019-03-23 ENCOUNTER — Other Ambulatory Visit: Payer: Self-pay

## 2019-03-23 DIAGNOSIS — G40009 Localization-related (focal) (partial) idiopathic epilepsy and epileptic syndromes with seizures of localized onset, not intractable, without status epilepticus: Secondary | ICD-10-CM

## 2019-03-23 DIAGNOSIS — G40909 Epilepsy, unspecified, not intractable, without status epilepticus: Secondary | ICD-10-CM | POA: Diagnosis not present

## 2019-03-23 MED ORDER — LAMOTRIGINE 100 MG PO TABS: ORAL_TABLET | ORAL | 3 refills | Status: DC

## 2019-03-23 MED ORDER — LAMOTRIGINE 25 MG PO TABS: ORAL_TABLET | ORAL | 3 refills | Status: DC

## 2019-03-23 NOTE — Patient Instructions (Signed)
1. Continue Lamotrigine 100mg  twice a day, take with 25mg  tablet twice a day (total of 125mg  twice a day)  2. Please call your cardiologist to discuss your current symptoms  3. Follow-up in 6 months, call for any changes  Seizure Precautions: 1. If medication has been prescribed for you to prevent seizures, take it exactly as directed.  Do not stop taking the medicine without talking to your doctor first, even if you have not had a seizure in a long time.   2. Avoid activities in which a seizure would cause danger to yourself or to others.  Don't operate dangerous machinery, swim alone, or climb in high or dangerous places, such as on ladders, roofs, or girders.  Do not drive unless your doctor says you may.  3. If you have any warning that you may have a seizure, lay down in a safe place where you can't hurt yourself.    4.  No driving for 6 months from last seizure, as per [facility].   Please refer to the following link on the [place] website for more information: [email or web address]/[email or web address]   5.  Maintain good sleep hygiene. Avoid alcohol.  6.  Contact your doctor if you have any problems that may be related to the medicine you are taking.  7.  Call 911 and bring the patient back to the ED if:        A.  The seizure lasts longer than 5 minutes.       B.  The patient doesn't awaken shortly after the seizure  C.  The patient has new problems such as difficulty seeing, speaking or moving  D.  The patient was injured during the seizure  E.  The patient has a temperature over 102 F (39C)  F.  The patient vomited and now is having trouble breathing

## 2019-03-23 NOTE — Telephone Encounter (Signed)
Called patient, and advised of how to take medication. Patient verbalized understanding.

## 2019-03-23 NOTE — Telephone Encounter (Signed)
Pt c/o Shortness Of Breath: STAT if SOB developed within the last 24 hours or pt is noticeably SOB on the phone  1. Are you currently SOB (can you hear that pt is SOB on the phone)? no  2. How long have you been experiencing SOB? About a week following hospital d/c, but it has gotten worse over time  3. Are you SOB when sitting or when up moving around? Moving around. Activities as simple as tying her shoes make the patient SOB  4. Are you currently experiencing any other symptoms? Nausea and dizziness  Sister called on behalf of the patient. The patient saw Dr. Ellouise Newer this morning, and at this other appointment, the patient told Dr. Delice Lesch that she had been experiencing some SOB on exertion. Dr. Delice Lesch advised the patient to call her Cardiologist. When the Sister asked if she had called the Cardiologist yet, the patient said no, so the sister called [place].  Sister and patient just want to make sure nothing is wrong

## 2019-03-23 NOTE — Telephone Encounter (Signed)
Patient states she has been SOB since being discharged from hospital on 09/03- she has noticed it worsening since then, per her sister- she states it happens even when sitting.  Patient does mention having a 'soreness' feeling in the middle of her chest- she has had that for about 2-3 days.  Patient does not mention swelling or weight gain.  Patient BP has been okay- 129/58 HR 64 at lunch timet his afternoon.  Patient does mention being lightheaded, and nauseated.  Visit from this morning neurologist was sent to [place] to review- will route telephone note as well, to see if any recommendations-  Patient has appointment on 10/06 of October.  I did advise patient that I would route a message to MD for any other recommendations but if that soreness in her chest becomes painful- into her arm/neck area that she should report to ER to be evaluated, or any worsening symptoms.  Patient verbalized understanding.

## 2019-03-23 NOTE — Telephone Encounter (Signed)
Have her take Furosemide 80 mg BID x 1 day, 80 mg in AM & 40 mg in PM x 3 days. Then go to 80 mg daily.  Hopefully, this will help & we can assess on 10/6.  Glenetta Hew, MD

## 2019-03-23 NOTE — Progress Notes (Signed)
NEUROLOGY FOLLOW UP OFFICE NOTE  [name] [id number] 04-25-1946  HISTORY OF PRESENT ILLNESS: I had the pleasure of seeing [name] in follow-up in the neurology clinic on 03/23/2019. She is again accompanied by her sister who helps supplement the history. The patient was last seen 8 months ago for seizures. 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 She had been doing well event-free for 2 years until she had an episode of loss of consciousness last 02/24/2019. She recalls folding clothes then suddenly felt like things were coming in around her. She apparently slumped over and fell to the floor, no convulsive activity witnessed. She was out for 5 minutes, and woke up to EMS around her. No tongue bite or incontinence. Her granddaughter was with her, her sister came in soon after and states it did not look like her typical seizures. She was brought to [facility] where she had an MRI brain without contrast which I personally reviewed, no acute changes, there was a small chronic right posterior frontal cortical infarct, mild diffuse atrophy and chronic microvascular disease. There was also note of empty sella with prominent arachnoids pits at the sphenoid wings bilaterally, which can be seen with IIH. She has no other symptoms of IIH, she denies any frequent headaches, no vision changes. She had an EEG which was unremarkable, there was note of a single sharp transient in the left frontotemporal region. Her Lamotrigine level was 5.5. Lamotrigine dose was increased to 126m BID. She denies missing any doses, she takes her medications with her sister as a reminder. Her sister denies any staring spells. She reports brief periods where she feels dizzy and just sits down, she has had a couple since increasing the Lamotrigine. There is concern as well that the event may be cardiac in origin, she has had a heart monitor, results pending. Her sister reports severe shortness of breath  even when sitting where it looks like she will pass out. She points to some chest pain in the mid-chest region. They deny any falls, but her walking has changed. She walks with her left foot everted. She has PT coming to her home. She does not drive.   History on Initial Assessment 12/09/2017: This is a 73year old right-handed woman with a history of hypertension, hyperlipidemia, diabetes, sleep apnea, drug-induced parkinsonism, presenting to establish care for seizures. She [facility] She has seen Dr. TCarles Colletin our office and Sinemet is being weaned off. Her last visit with the Epilepsy clinic was in 2017. Records were reviewed and will be summarized for this visit. She reports the first seizure occurred in March 2009. She was driving at that time and lost consciousness without any prior warning symptoms. Family witnessed episodes of staring off and whole body jerking with foaming at the mouth. Initially she was having episodes every 3 months, then they began to cluster having up to 5 events at a time. [facility]. One epileptic event localized to the left anterior temporal region. She had 2 additional events with abnormal facial and hand movements without clear EEG changes seen. She was started on Lamotrigine at that time. She had a 3-day ambulatory EEG that was technically limited, with note of a single left temporal spike and multiple push button events with no definite EEG correlate, although EEG was obscured by artifact during some of the  events. Her sister has described her going into a trance with abnormal mouth movements (mouth moving quickly), followed by right arm shaking lasting up to 5 minutes, at times with associated urinary incontinence. The last episode was in December 2018. She would feel lost and dizzy after, no focal weakness. One time she told her sister  there was a funny taste in her mouth after a seizure. She is taking Lamotrigine '100mg'$  BID without side effects. She is also on Lyrica '100mg'$  BID for back pain, no side effects.   She reports she feels fine other than she can't get around well due to her feet issues. She has occasional sharp headaches on the left temporal region, she had one the other day lasting a few minutes with no associated nausea/vomiting. She has rare dizziness, no diplopia, dysarthria/dysphagia, neck pain, focal numbness/tingling, bladder dysfunction. She has chronic back pain and pain in both legs, she has had constipation due to iron supplements. She lives with her sister and does not drive.   Epilepsy Risk Factors:  Her sister thinks her mother had seizures due to episodes of right arm shaking/tremors, but as far as she knows was not on seizure medication. Otherwise she had a normal birth and early development.  There is no history of febrile convulsions, CNS infections such as meningitis/encephalitis, significant traumatic brain injury, neurosurgical procedures.  Laboratory Data: Bloodwork from 08/09/15: Lamotrigine level 3.5 EEGs:as above MRI: I personally reviewed MRI brain done 05/2017 which did not show any acute changes. 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  PAST MEDICAL HISTORY: Past Medical History:  Diagnosis Date   Abnormal liver function     in the past.   Anemia    Arthritis    all over   Bruises easily    Cataract    right eye;immature   Chronic back pain    stenosis   Chronic cough    Chronic kidney disease    COPD (chronic obstructive pulmonary disease) ([place])    Demand myocardial infarction ([place]) 2012   Demand Infarction in setting of Pancreatitis --> mild Troponin elevation, NON-OBSTRUCTIVE CAD   Depression    takes Abilify daily as well as  Zoloft   Diastolic heart failure 7494   Grade 1 diastolic Dysfunction by Echo    Diverticulosis    DM (diabetes mellitus) ([place])    takes Victoza daily as well as Lantus and Humalog   Empty sella ([place])    on MRI in 2009.   Glaucoma    Headache(784.0)    last migraine-4-27yr ago   History of blood transfusion    no abnormal reaction noted   History of colon polyps    benign   HTN (hypertension)    takes Benicar,Imdur,and Bystolic daily   Hyperlipidemia    takes Lipitor daily   Joint swelling    Nocturia    [place]    Obstructive sleep apnea 02/2018   Notably improved split-night study with weight loss from 270 (2017) down to 250 pounds (2019).   Pancreatitis    takes Pancrelipase daily   [place]inson's disease ([place]    takes Sinemet daily   Peripheral neuropathy    Pneumonia 2012   Seizures [place]    takes Lamictal daily and Primidone nightly;last seizure 2wks ago   Urinary urgency    With increased frequency   Varicose veins of  both lower extremities with pain    With edema.  Takes daily Lasix    MEDICATIONS: Current Outpatient Medications on File Prior to Visit  Medication Sig Dispense Refill   Accu-Chek Softclix Lancets lancets Use as instructed to check blood sugars 2 times per day dx: e11.22 300 each 3   acetaminophen (TYLENOL) 500 MG tablet Take 500 mg by mouth every 4 (four) hours as needed (for pain).      albuterol (PROVENTIL HFA;VENTOLIN HFA) 108 (90 Base) MCG/ACT inhaler Inhale 1-2 puffs into the lungs every 6 (six) hours as needed for wheezing or shortness of breath (DOE WHEEZING SOB). 1 Inhaler 1   atorvastatin (LIPITOR) 10 MG tablet TAKE 1 TABLET EVERY DAY (Patient taking differently: Take 10 mg by mouth daily. ) 90 tablet 1   B Complex-C (B-COMPLEX WITH VITAMIN C) tablet Take 1 tablet by mouth daily.     Blood Glucose Calibration (ACCU-CHEK AVIVA) SOLN Use as directed dx: e11.22 1 each 3   Blood Glucose  Monitoring Suppl (ACCU-CHEK AVIVA PLUS) w/Device KIT Use as directed to check blood sugars 2 times per day dx: e11.22 1 kit 3   carvedilol (COREG) 25 MG tablet Take 1 tablet (25 mg total) by mouth 2 (two) times daily with a meal. 180 tablet 3   Cholecalciferol (VITAMIN D-3) 1000 units CAPS Take 1,000 Units by mouth daily.     diclofenac sodium (VOLTAREN) 1 % GEL Apply 2 g topically 4 (four) times daily as needed (pain).     DROPLET PEN NEEDLES 31G X 8 MM MISC USE DAILY WITH VICTOZA AND/OR NOVOLOG.  400 each 1   furosemide (LASIX) 40 MG tablet Take one tablet (86m) by mouth on Saturday and Sunday only. (Patient taking differently: Take 40 mg by mouth See admin instructions. Take one tablet (41m by mouth on Saturday and Sunday only.) 180 tablet 3   furosemide (LASIX) 80 MG tablet Take one tablet (8074mby mouth Monday through Friday only. (Patient taking differently: Take 80 mg by mouth See admin instructions. Take one tablet (60m68my mouth Monday through Friday only.) 90 tablet 3   glucose blood (ACCU-CHEK AVIVA PLUS) test strip Use as instructed to check blood sugars 2 times per day dx: e11.22 300 each 3   hydrALAZINE (APRESOLINE) 100 MG tablet Take 100 mg twice a day , if blood pressure is greater than 150 mmhg take an extra tablet . (Patient taking differently: Take 100 mg by mouth daily as needed. Take 100 mg twice a day as needed if blood pressure is greater than 150 mmhg take an extra tablet .) 90 tablet 3   Insulin Glargine, 1 Unit Dial, (TOUJEO SOLOSTAR) 300 UNIT/ML SOPN Inject 36 Units into the skin at bedtime. (Patient taking differently: Inject 34 Units into the skin at bedtime. ) 15 pen 2   Isopropyl Alcohol (ALCOHOL WIPES) 70 % MISC Apply 1 each topically 2 (two) times daily. 300 each 3   isosorbide mononitrate (IMDUR) 60 MG 24 hr tablet Take 1 tablet (60 mg total) by mouth daily. 90 tablet 3   lamoTRIgine (LAMICTAL) 100 MG tablet Take 1 tablet (100 mg total) by mouth 2 (two)  times daily. Take along with 25mg51m 125mg 69me a day of lamictal 180 tablet 0   lamoTRIgine (LAMICTAL) 25 MG tablet Take 1 tablet (25 mg total) by mouth 2 (two) times daily. Take along with 100mg B47mo make it 125mg BI18m tablet 0   Multiple Vitamins-Minerals (MULTIVITAMIN WITH MINERALS)  tablet Take 1 tablet by mouth daily.     Pancrelipase, Lip-Prot-Amyl, (ZENPEP) 25000-79000 units CPEP Take 1-2 capsules by mouth See admin instructions. Take 2 capsules with meals and 1 capsule with snacks     potassium chloride SA (K-DUR) 20 MEQ tablet TAKE 2 TABLETS ONCE DAILY, BUT ON THE DAY YOU  TAKE METOLAZONE TAKE AN EXTRA TABLET FOR A TOTAL OF 3 TABLETS 204 tablet 0   Semaglutide, 1 MG/DOSE, (OZEMPIC, 1 MG/DOSE,) 2 MG/1.5ML SOPN Inject 1 mg into the skin once a week. 3 pen 1   sertraline (ZOLOFT) 50 MG tablet TAKE 1 TABLET EVERY DAY (Patient taking differently: Take 50 mg by mouth at bedtime. ) 90 tablet 2   No current facility-administered medications on file prior to visit.     ALLERGIES: Allergies  Allergen Reactions   Other Anaphylaxis and Rash    Bleach   Penicillins Hives    Has patient had a PCN reaction causing immediate rash, facial/tongue/throat swelling, SOB or lightheadedness with hypotension: Yes Has patient had a PCN reaction causing severe rash involving mucus membranes or skin necrosis: No Has patient had a PCN reaction that required hospitalization: No Has patient had a PCN reaction occurring within the last 10 years: No If all of the above answers are "NO", then may proceed with Cephalosporin use.    Sulfa Antibiotics Hives   Aspirin Other (See Comments)     On aspirin 81 mg - Rectal bleeding in dec 2018   Codeine     Headache and makes the patient feel "off"    FAMILY HISTORY: Family History  Problem Relation Age of Onset   Allergies Father    Heart disease Father        before 83   Heart failure Father    Hypertension Father    Hyperlipidemia  Father    Heart disease Mother    Diabetes Mother    Hypertension Mother    Hyperlipidemia Mother    Hypertension Sister    Heart disease Sister        before 72   Hyperlipidemia Sister    Diabetes Brother    Hypertension Brother    Diabetes Sister    Hypertension Sister    Diabetes Son    Hypertension Son    Cancer Other     SOCIAL HISTORY: Social History   Socioeconomic History   Marital status: Divorced    Spouse name: Not on file   Number of children: Not on file   Years of education: Not on file   Highest education level: Not on file  Occupational History   Occupation: Disabled    Comment: CNA  Social Needs   Financial resource strain: Not hard at all   Food insecurity    Worry: Never true    Inability: Never true   Transportation needs    Medical: No    Non-medical: No  Tobacco Use   Smoking status: Former Smoker    Packs/day: 0.50    Years: 25.00    Pack years: 12.50    Types: Cigarettes   Smokeless tobacco: Never Used   Tobacco comment: quit smoking 20+ytrs ago  Substance and Sexual Activity   Alcohol use: No   Drug use: No   Sexual activity: Not Currently    Birth control/protection: Surgical  Lifestyle   Physical activity    Days per week: 2 days    Minutes per session: 30 min   Stress: Not at all  Relationships  Social Herbalist on phone: Not on file    Gets together: Not on file    Attends religious service: Not on file    Active member of club or organization: Not on file    Attends meetings of clubs or organizations: Not on file    Relationship status: Not on file   Intimate partner violence    Fear of current or ex partner: No    Emotionally abused: No    Physically abused: No    Forced sexual activity: No  Other Topics Concern   Not on file  Social History Narrative   She lives in [place]. She has lots of family in the area and is accompanied by her sister.   She is a retired Quarry manager.   Disabled secondary to recurrent seizure activity.   She has a distant history of smoking, quit 20 years ago.    REVIEW OF SYSTEMS: Constitutional: No fevers, chills, or sweats, no generalized fatigue, change in appetite Eyes: No visual changes, double vision, eye pain Ear, nose and throat: No hearing loss, ear pain, nasal congestion, sore throat Cardiovascular: No chest pain, palpitations Respiratory:  No shortness of breath at rest or with exertion, wheezes GastrointestinaI: No nausea, vomiting, diarrhea, abdominal pain, fecal incontinence Genitourinary:  No dysuria, urinary retention or frequency Musculoskeletal:  No neck pain, back pain Integumentary: No rash, pruritus, skin lesions Neurological: as above Psychiatric: No depression, insomnia, anxiety Endocrine: No palpitations, fatigue, diaphoresis, mood swings, change in appetite, change in weight, increased thirst Hematologic/Lymphatic:  No anemia, purpura, petechiae. Allergic/Immunologic: no itchy/runny eyes, nasal congestion, recent allergic reactions, rashes  PHYSICAL EXAM: Vitals:   03/23/19 1137  BP: 132/86  Pulse: 69  SpO2: 99%   General: No acute distress Head:  Normocephalic/atraumatic Skin/Extremities: No rash, no edema Neurological Exam: alert and oriented to person, place, and time. No aphasia or dysarthria. Fund of knowledge is appropriate.  Recent and remote memory are intact.  Attention and concentration are normal.    Able to name objects and repeat phrases. Cranial nerves: Pupils equal, round, reactive to light.Extraocular movements intact with no nystagmus. Visual fields full. No facial asymmetry. Tongue, uvula, palate midline.  Motor: Bulk and tone normal, no cogwheeling, muscle strength 5/5 throughout with no pronator drift.  Finger to nose testing intact.  Gait slow and cautious with cane, left foot everted. No ataxia. No tremor today.  IMPRESSION: This is a 73 yo RH woman with a history of hypertension,  hyperlipidemia, diabetes, sleep apnea, drug-induced parkinsonism, and seizures. She has had evaluation at [facility] with video EEG capturing one epileptic seizure arising from the left anterior temporal region. She has also had several episodes captured on inpatient and ambulatory EEG with no EEG correlate. 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 She had been seizure-free for 2 years until she had an episode of loss of consciousness last 02/24/2019. Unclear if seizure versus cardiac, MRI and EEG unremarkable, lamotrigine dose increased slightly to '125mg'$  BID. She reports chest pain and significant shortness of breath even at rest, and was instructed to contact her cardiologist. She does not drive. She will follow-up in 6 months and knows to call for any changes  Thank you for allowing me to participate in her care.  Please do not hesitate to call for any questions or concerns.  The duration of this appointment visit was 27 minutes of face-to-face time with the patient.  Greater than 50% of this  time was spent in counseling, explanation of diagnosis, planning of further management, and coordination of care.   Ellouise Newer, M.D.   CC: Dr. Baird Cancer, Dr. Ellyn Hack

## 2019-03-24 ENCOUNTER — Telehealth: Payer: Self-pay

## 2019-03-24 DIAGNOSIS — J432 Centrilobular emphysema: Secondary | ICD-10-CM | POA: Diagnosis not present

## 2019-03-24 DIAGNOSIS — I130 Hypertensive heart and chronic kidney disease with heart failure and stage 1 through stage 4 chronic kidney disease, or unspecified chronic kidney disease: Secondary | ICD-10-CM | POA: Diagnosis not present

## 2019-03-24 DIAGNOSIS — G20 Parkinson's disease: Secondary | ICD-10-CM | POA: Diagnosis not present

## 2019-03-24 DIAGNOSIS — I5032 Chronic diastolic (congestive) heart failure: Secondary | ICD-10-CM | POA: Diagnosis not present

## 2019-03-24 DIAGNOSIS — E1142 Type 2 diabetes mellitus with diabetic polyneuropathy: Secondary | ICD-10-CM | POA: Diagnosis not present

## 2019-03-24 DIAGNOSIS — G8384 Todd's paralysis (postepileptic): Secondary | ICD-10-CM | POA: Diagnosis not present

## 2019-03-24 DIAGNOSIS — G40009 Localization-related (focal) (partial) idiopathic epilepsy and epileptic syndromes with seizures of localized onset, not intractable, without status epilepticus: Secondary | ICD-10-CM | POA: Diagnosis not present

## 2019-03-24 DIAGNOSIS — I69354 Hemiplegia and hemiparesis following cerebral infarction affecting left non-dominant side: Secondary | ICD-10-CM | POA: Diagnosis not present

## 2019-03-24 DIAGNOSIS — I0981 Rheumatic heart failure: Secondary | ICD-10-CM | POA: Diagnosis not present

## 2019-03-25 ENCOUNTER — Telehealth: Payer: Self-pay

## 2019-03-25 ENCOUNTER — Ambulatory Visit (INDEPENDENT_AMBULATORY_CARE_PROVIDER_SITE_OTHER): Payer: Medicare HMO | Admitting: Pharmacist

## 2019-03-25 DIAGNOSIS — N182 Chronic kidney disease, stage 2 (mild): Secondary | ICD-10-CM | POA: Diagnosis not present

## 2019-03-25 DIAGNOSIS — J432 Centrilobular emphysema: Secondary | ICD-10-CM | POA: Diagnosis not present

## 2019-03-25 DIAGNOSIS — E1142 Type 2 diabetes mellitus with diabetic polyneuropathy: Secondary | ICD-10-CM | POA: Diagnosis not present

## 2019-03-25 DIAGNOSIS — G20 Parkinson's disease: Secondary | ICD-10-CM | POA: Diagnosis not present

## 2019-03-25 DIAGNOSIS — E1122 Type 2 diabetes mellitus with diabetic chronic kidney disease: Secondary | ICD-10-CM

## 2019-03-25 DIAGNOSIS — G40009 Localization-related (focal) (partial) idiopathic epilepsy and epileptic syndromes with seizures of localized onset, not intractable, without status epilepticus: Secondary | ICD-10-CM | POA: Diagnosis not present

## 2019-03-25 DIAGNOSIS — I0981 Rheumatic heart failure: Secondary | ICD-10-CM | POA: Diagnosis not present

## 2019-03-25 DIAGNOSIS — I130 Hypertensive heart and chronic kidney disease with heart failure and stage 1 through stage 4 chronic kidney disease, or unspecified chronic kidney disease: Secondary | ICD-10-CM | POA: Diagnosis not present

## 2019-03-25 DIAGNOSIS — G8384 Todd's paralysis (postepileptic): Secondary | ICD-10-CM | POA: Diagnosis not present

## 2019-03-25 DIAGNOSIS — Z794 Long term (current) use of insulin: Secondary | ICD-10-CM | POA: Diagnosis not present

## 2019-03-25 DIAGNOSIS — I5032 Chronic diastolic (congestive) heart failure: Secondary | ICD-10-CM

## 2019-03-25 DIAGNOSIS — I69354 Hemiplegia and hemiparesis following cerebral infarction affecting left non-dominant side: Secondary | ICD-10-CM | POA: Diagnosis not present

## 2019-03-25 NOTE — Progress Notes (Signed)
Chronic Care Management   Visit Note  03/25/2019 Name: [name] MRN: [id number] DOB: 1945-12-14  Referred by: [name], MD Reason for referral : Chronic Care Management   [name] is a 73 y.o. year old female who is a primary care patient of [name], MD. The CCM team was consulted for assistance with chronic disease management and care coordination needs.   Review of patient status, including review of consultants reports, relevant laboratory and other test results, and collaboration with appropriate care team members and the patient's provider was performed as part of comprehensive patient evaluation and provision of chronic care management services.    I spoke with [name] by telephone today.  Advanced Directives Status: N See Care Plan and Vynca application for related entries.   Medications: Outpatient Encounter Medications as of 03/25/2019  Medication Sig Note  . Accu-Chek Softclix Lancets lancets Use as instructed to check blood sugars 2 times per day dx: e11.22   . acetaminophen (TYLENOL) 500 MG tablet Take 500 mg by mouth every 4 (four) hours as needed (for pain).    Marland Kitchen albuterol (PROVENTIL HFA;VENTOLIN HFA) 108 (90 Base) MCG/ACT inhaler Inhale 1-2 puffs into the lungs every 6 (six) hours as needed for wheezing or shortness of breath (DOE WHEEZING SOB).   Marland Kitchen atorvastatin (LIPITOR) 10 MG tablet TAKE 1 TABLET EVERY DAY (Patient taking differently: Take 10 mg by mouth daily. )   . B Complex-C (B-COMPLEX WITH VITAMIN C) tablet Take 1 tablet by mouth daily.   . Blood Glucose Calibration (ACCU-CHEK AVIVA) SOLN Use as directed dx: e11.22   . Blood Glucose Monitoring Suppl (ACCU-CHEK AVIVA PLUS) w/Device KIT Use as directed to check blood sugars 2 times per day dx: e11.22   . carvedilol (COREG) 25 MG tablet Take 1 tablet (25 mg total) by mouth 2 (two) times daily with a meal.   . Cholecalciferol (VITAMIN D-3) 1000 units CAPS Take 1,000 Units by mouth daily.    . diclofenac sodium (VOLTAREN) 1 % GEL Apply 2 g topically 4 (four) times daily as needed (pain).   . DROPLET PEN NEEDLES 31G X 8 MM MISC USE DAILY WITH VICTOZA AND/OR NOVOLOG.    . furosemide (LASIX) 40 MG tablet Take one tablet (77m) by mouth on Saturday and Sunday only. (Patient taking differently: Take 40 mg by mouth See admin instructions. Take one tablet (446m by mouth on Saturday and Sunday only.)   . furosemide (LASIX) 80 MG tablet Take one tablet (8062mby mouth Monday through Friday only. (Patient taking differently: Take 80 mg by mouth See admin instructions. Take one tablet (27m57my mouth Monday through Friday only.)   . glucose blood (ACCU-CHEK AVIVA PLUS) test strip Use as instructed to check blood sugars 2 times per day dx: e11.22   . hydrALAZINE (APRESOLINE) 100 MG tablet Take 100 mg twice a day , if blood pressure is greater than 150 mmhg take an extra tablet . (Patient taking differently: Take 100 mg by mouth daily as needed. Take 100 mg twice a day as needed if blood pressure is greater than 150 mmhg take an extra tablet .)   . Insulin Glargine, 1 Unit Dial, (TOUJEO SOLOSTAR) 300 UNIT/ML SOPN Inject 36 Units into the skin at bedtime. (Patient taking differently: Inject 34 Units into the skin at bedtime. )   . Isopropyl Alcohol (ALCOHOL WIPES) 70 % MISC Apply 1 each topically 2 (two) times daily.   . isosorbide mononitrate (IMDUR) 60 MG 24  hr tablet Take 1 tablet (60 mg total) by mouth daily.   Marland Kitchen lamoTRIgine (LAMICTAL) 100 MG tablet Take 1 tablet twice a day (Take with 37m twice a day to make a total of 1222mtwice a day)   . lamoTRIgine (LAMICTAL) 25 MG tablet Take 1 tablet twice a day (Take with 10060mablet twice a day, to make it 125m31mice a day)   . Multiple Vitamins-Minerals (MULTIVITAMIN WITH MINERALS) tablet Take 1 tablet by mouth daily.   . Pancrelipase, Lip-Prot-Amyl, (ZENPEP) 25000-79000 units CPEP Take 1-2 capsules by mouth See admin instructions. Take 2 capsules with  meals and 1 capsule with snacks   . potassium chloride SA (K-DUR) 20 MEQ tablet TAKE 2 TABLETS ONCE DAILY, BUT ON THE DAY YOU  TAKE METOLAZONE TAKE AN EXTRA TABLET FOR A TOTAL OF 3 TABLETS   . Semaglutide, 1 MG/DOSE, (OZEMPIC, 1 MG/DOSE,) 2 MG/1.5ML SOPN Inject 1 mg into the skin once a week. 02/24/2019: Take on Thursdays per patient   . sertraline (ZOLOFT) 25 MG tablet Take 25 mg by mouth daily.   . sertraline (ZOLOFT) 50 MG tablet TAKE 1 TABLET EVERY DAY (Patient taking differently: Take 50 mg by mouth at bedtime. )    No facility-administered encounter medications on file as of 03/25/2019.      Objective:   Goals Addressed            This Visit's Progress     Patient Stated   . I would like to manage and optimize my medications for my chronic conditions (pt-stated)       Current Barriers:  . Diabetes: T2DM; most recent A1c 8.5% on 02/24/19 (was 8.6% on 01/21/19) patient motivated to decrease A1c back to goal.  Reminded patient of PCP appt on 04/06/19 . Current antihyperglycemic regimen: Ozempic, Toujeo 34 units qHS . denies hypoglycemic symptoms; denies hyperglycemic symptoms . Current meal patterns: . Current exercise: n/a; motivated patient to participate in exercise as able and as recommended by PCP . Current blood glucose readings: FBG 110-130s.  This has improved! . Cardiovascular risk reduction: o Current hypertensive regimen: carvedilol, hydralazine, isosorbide o Current hyperlipidemia regimen:  atorvastatin 10mg74mst filled #90 on 12/24/18) o Of note, patient is on lasix for CHF.  Reviewed new cardiology dosing with patient per chart. Her weights have gone from 206-->203,204.  Encouraged her to call cardiologist if needs arise.  She states her SOB/pain have decreased.  She has follow up on 03/31/19  Pharmacist Clinical Goal(s):  . OveMarland Kitchen the next 90 days, patient with work with PharmD and primary care provider to address needs related to optimization of medication management of  chronic conditions (diabetes).  Interventions: . Comprehensive medication review performed, medication list updated in electronic medical record.  Reviewed medication fill history via insurance claims data confirming patient appears compliant with having her medications filled on time as prescribed by provider. . Reviewed & discussed the following diabetes-related information with patient: o Continue checking blood sugars as directed o Follow ADA recommended "diabetes-friendly" diet  (reviewed healthy snack/food options) o Discussed GLP-1 injection technique; Patient uses AccuCheck glucometer o Reviewed medication purpose/side effects-->patient denies adverse events o Continue taking all medications as prescribed by provider   Patient Self Care Activities:  . Patient will check blood glucose daily , document, and provide at future appointments . Patient will focus on medication adherence by continuing to take medications as prescribed. . Patient will take medications as prescribed . Patient will contact provider with any episodes  of hypoglycemia . Patient will report any questions or concerns to provider   Please see past updates related to this goal by clicking on the "Past Updates" button in the selected goal          Plan:   The care management team will reach out to the patient again over the next 4-6 weeks.  [name], PharmD, BCPS Clinical Pharmacist, [facility] [place] [facility]: [phone]

## 2019-03-25 NOTE — Patient Instructions (Signed)
Visit Information  Goals Addressed            This Visit's Progress     Patient Stated   . I would like to manage and optimize my medications for my chronic conditions (pt-stated)       Current Barriers:  . Diabetes: T2DM; most recent A1c 8.5% on 02/24/19 (was 8.6% on 01/21/19) patient motivated to decrease A1c back to goal.  Reminded patient of PCP appt on 04/06/19 . Current antihyperglycemic regimen: Ozempic, Toujeo 34 units qHS . denies hypoglycemic symptoms; denies hyperglycemic symptoms . Current meal patterns: . Current exercise: n/a; motivated patient to participate in exercise as able and as recommended by PCP . Current blood glucose readings: FBG 110-130s.  This has improved! . Cardiovascular risk reduction: o Current hypertensive regimen: carvedilol, hydralazine, isosorbide o Current hyperlipidemia regimen:  atorvastatin 10mg  (last filled #90 on 12/24/18) o Of note, patient is on lasix for CHF.  Reviewed new cardiology dosing with patient per chart. Her weights have gone from 206-->203,204.  Encouraged her to call cardiologist if needs arise.  She states her SOB/pain have decreased.  She has follow up on 03/31/19  Pharmacist Clinical Goal(s):  Marland Kitchen Over the next 90 days, patient with work with PharmD and primary care provider to address needs related to optimization of medication management of chronic conditions (diabetes).  Interventions: . Comprehensive medication review performed, medication list updated in electronic medical record.  Reviewed medication fill history via insurance claims data confirming patient appears compliant with having her medications filled on time as prescribed by provider. . Reviewed & discussed the following diabetes-related information with patient: o Continue checking blood sugars as directed o Follow ADA recommended "diabetes-friendly" diet  (reviewed healthy snack/food options) o Discussed GLP-1 injection technique; Patient uses AccuCheck  glucometer o Reviewed medication purpose/side effects-->patient denies adverse events o Continue taking all medications as prescribed by provider   Patient Self Care Activities:  . Patient will check blood glucose daily , document, and provide at future appointments . Patient will focus on medication adherence by continuing to take medications as prescribed. . Patient will take medications as prescribed . Patient will contact provider with any episodes of hypoglycemia . Patient will report any questions or concerns to provider   Please see past updates related to this goal by clicking on the "Past Updates" button in the selected goal         The patient verbalized understanding of instructions provided today and declined a print copy of patient instruction materials.   The care management team will reach out to the patient again over the next 4-6 weeks.   [name], PharmD, BCPS Clinical Pharmacist, [facility] [place]: [phone]

## 2019-03-26 ENCOUNTER — Telehealth: Payer: Self-pay | Admitting: Adult Health

## 2019-03-26 NOTE — Telephone Encounter (Signed)
New Message:   Pt says her sister will need to come in with her for her appt on 03-31-19 with Jory Sims.. She says she needs her, because a lot of things she does not understand.

## 2019-03-26 NOTE — Telephone Encounter (Signed)
Pt updated and voiced understanding.  

## 2019-03-26 NOTE — Telephone Encounter (Signed)
I am okay with her sister coming with her to appointment.

## 2019-03-30 ENCOUNTER — Other Ambulatory Visit: Payer: Self-pay | Admitting: Internal Medicine

## 2019-03-30 DIAGNOSIS — E1142 Type 2 diabetes mellitus with diabetic polyneuropathy: Secondary | ICD-10-CM | POA: Diagnosis not present

## 2019-03-30 DIAGNOSIS — I69354 Hemiplegia and hemiparesis following cerebral infarction affecting left non-dominant side: Secondary | ICD-10-CM | POA: Diagnosis not present

## 2019-03-30 DIAGNOSIS — G40009 Localization-related (focal) (partial) idiopathic epilepsy and epileptic syndromes with seizures of localized onset, not intractable, without status epilepticus: Secondary | ICD-10-CM | POA: Diagnosis not present

## 2019-03-30 DIAGNOSIS — G8384 Todd's paralysis (postepileptic): Secondary | ICD-10-CM | POA: Diagnosis not present

## 2019-03-30 DIAGNOSIS — I0981 Rheumatic heart failure: Secondary | ICD-10-CM | POA: Diagnosis not present

## 2019-03-30 DIAGNOSIS — I130 Hypertensive heart and chronic kidney disease with heart failure and stage 1 through stage 4 chronic kidney disease, or unspecified chronic kidney disease: Secondary | ICD-10-CM | POA: Diagnosis not present

## 2019-03-30 DIAGNOSIS — J432 Centrilobular emphysema: Secondary | ICD-10-CM | POA: Diagnosis not present

## 2019-03-30 DIAGNOSIS — I5032 Chronic diastolic (congestive) heart failure: Secondary | ICD-10-CM | POA: Diagnosis not present

## 2019-03-30 DIAGNOSIS — I2510 Atherosclerotic heart disease of native coronary artery without angina pectoris: Secondary | ICD-10-CM

## 2019-03-30 DIAGNOSIS — G20 Parkinson's disease: Secondary | ICD-10-CM | POA: Diagnosis not present

## 2019-03-30 NOTE — Progress Notes (Signed)
Cardiology Office Note   Date:  03/31/2019   ID:  [name], DOB 02/18/1946, MRN [id number]  PCP:  Sanders, Robyn, MD  Cardiologist: Dr. Harding  CC:Follow Up   History of Present Illness: [name] is a 73 y.o. female who presents for post hospital follow up after being seen on consultation by Dr. Jordan for complaints of syncope. She has a history of diastolic CHF, PAC's, Seizure Disorder, and prior CVA.  She had a cardiac cath in November of 2019 which did not reveal any significant coronary artery disease. It was felt that her syncopal episode was related to neurologic etiology, possibly seizure.   Additionally, she has a history of type 2 diabetes, OSA on CPAP, New York Heart Association class II diastolic CHF, and dyslipidemia.  She was to have a follow up 2 week, Zio cardiac monitor on discharge, and repeat echocardiogram. [name] called our office on 03/23/2019, and reported that since she was discharged on 02/25/2019, she had experienced worsening dyspnea, also occurring while sitting. She denies edema or lightheadedness. She was instructed by Dr. Harding to increase furosemide to 80 mg BID X 1 day, then 80 mg in the am, and 40 mg in the pm for 3 days. Then return to 80 mg daily.   Echocardiogram on 03/15/2019 demonstrated EF of 50%-55%, diastolic dysfunction was noted. She was also noted to have severely reduced right ventricular systolic function with severe cavity enlargement.   She comes to the office today with her sister.They report frequent dizziness and near syncope.  She responded to the increased lasix, but dizziness has worsened. She feels weak. She remains on CPAP at night but has orthopnea and PND, and therefore sleeps sitting up.    Past Medical History:  Diagnosis Date  . Abnormal liver function     in the past.  . Anemia   . Arthritis    all over  . Bruises easily   . Cataract    right eye;immature  . Chronic back pain    stenosis  . Chronic  cough   . Chronic kidney disease   . COPD (chronic obstructive pulmonary disease) (HCC)   . Demand myocardial infarction (HCC) 2012   Demand Infarction in setting of Pancreatitis --> mild Troponin elevation, NON-OBSTRUCTIVE CAD  . Depression    takes Abilify daily as well as Zoloft  . Diastolic heart failure 2010   Grade 1 diastolic Dysfunction by Echo   . Diverticulosis   . DM (diabetes mellitus) (HCC)    takes Victoza daily as well as Lantus and Humalog  . Empty sella (HCC)    on MRI in 2009.  . Glaucoma   . Headache(784.0)    last migraine-4-5yrs ago  . History of blood transfusion    no abnormal reaction noted  . History of colon polyps    benign  . HTN (hypertension)    takes Benicar,Imdur,and Bystolic daily  . Hyperlipidemia    takes Lipitor daily  . Joint swelling   . Nocturia   . Obesity hypoventilation syndrome (HCC)   . Obstructive sleep apnea 02/2018   Notably improved split-night study with weight loss from 270 (2017) down to 250 pounds (2019).  . Pancreatitis    takes Pancrelipase daily  . Parkinson's disease (HCC)    takes Sinemet daily  . Peripheral neuropathy   . Pneumonia 2012  . Seizures (HCC)    takes Lamictal daily and Primidone nightly;last seizure 2wks ago  . Urinary urgency      With increased frequency  . Varicose veins of both lower extremities with pain    With edema.  Takes daily Lasix    Past Surgical History:  Procedure Laterality Date  . ABDOMINAL HYSTERECTOMY    . APPENDECTOMY    . BACK SURGERY    . CARDIAC CATHETERIZATION  2009/March 2012   2009: (Dr. Karakia) Nonobstructive CAD; 2012: Minimal CAD --> false positive stress test  . Cardiac Event Monitor  September-October 2017   Sinus rhythm with occasional PACs and artifact. No arrhythmias besides one short run of tachycardia.  . CHOLECYSTECTOMY    . COLONOSCOPY N/A 08/15/2012   Procedure: COLONOSCOPY;  Surgeon: William Outlaw, MD;  Location: [facility];  Service: Endoscopy;   Laterality: N/A;  . COLONOSCOPY WITH PROPOFOL Left 06/17/2017   Procedure: COLONOSCOPY WITH PROPOFOL;  Surgeon: Karki, Arya, MD;  Location: [facility];  Service: Gastroenterology;  Laterality: Left;  . CORONARY CALCIUM SCORE AND CTA  04/2018   Coronary calcium score 71.9.  Very large, hyperdynamic LAD wrapping apex giving rise to PDA.  Moderate LAD-diagonal and circumflex plaque -> CT FFR suggested positive findings in distal D1, D2 and distal circumflex.  Referred for cath. ==> FALSE POSITIVE  . ESOPHAGOGASTRODUODENOSCOPY    . eye cysts Bilateral   . LASIK    . LEFT HEART CATH AND CORONARY ANGIOGRAPHY N/A 05/16/2018   Procedure: LEFT HEART CATH AND CORONARY ANGIOGRAPHY;  Surgeon: Smith, Henry W, MD;  Location: [facility] INVASIVE CV LAB:  Angiographically normal coronary arteries with LVEDP of 21 mmHg.  Large draping hyperdominant LAD that wraps the apex and provides distal half of the PDA.  Relatively small caliber distal Cx -->proxLPDA, non-dom RCA. No Cx or Diag lesions -- FALSE + CT FFR  . NM MYOVIEW LTD  01/2016; 01/2018   a)LOW RISK. No ischemia or infarction;; b) EF 55-60%. NO ST changes.  No ischemia or Infarction. NO significant RV enlargement.  LOW RISK.  . Polysomnogram  02/2018   (Dr. Dohmeier from neurology): Split study was not adequately done due to low AHI.  She slept reclined and had an AHI less than 10, prolonged hypoxemia and no hypercapnia. -->  She has home oxygen already prescribed, but did not meet criteria for nightly oxygen.  (Was noted that the patient did not cooperate well with study.  Titration was discussed.  . RECTAL POLYPECTOMY    . TONSILLECTOMY    . TRANSTHORACIC ECHOCARDIOGRAM  01/2016; 05/2017   A) Normal LV chamber size with mod LVH pattern. EF 50-55%. Severe LA dilation. Mod RA dilation. PAP elevated at 38 mmHg;; B) Mild concentric LVH.  EF 55-60% & no RWMA.  Grade 2 DD.  Diastolic flattening of the ventricular septum == ? elevated PAP.  Mild aortic valve  calcification/sclerosis.  Mod LA dilation.  Mild RV dilation & Mod RA dilation  . TRANSTHORACIC ECHOCARDIOGRAM  10/2017; 01/2018   a) EF 55-60%.  No RWMA,  Moderate LA dilation.  Mild LA dilation.  Peak PA pressures ~57 mmHg (moderate);;; b) EF 55-60%.  GRII DD.  Suggestion of RV volume overload with moderate RV dilation.  Severe LA and RA dilation..  PA pressure ~67%.  . TUBAL LIGATION    . vaginal cyst removed     several times     Current Outpatient Medications  Medication Sig Dispense Refill  . Accu-Chek Softclix Lancets lancets Use as instructed to check blood sugars 2 times per day dx: e11.22 300 each 3  . acetaminophen (TYLENOL) 500 MG tablet   Take 500 mg by mouth every 4 (four) hours as needed (for pain).     . albuterol (PROVENTIL HFA;VENTOLIN HFA) 108 (90 Base) MCG/ACT inhaler Inhale 1-2 puffs into the lungs every 6 (six) hours as needed for wheezing or shortness of breath (DOE WHEEZING SOB). 1 Inhaler 1  . atorvastatin (LIPITOR) 10 MG tablet TAKE 1 TABLET EVERY DAY 90 tablet 1  . B Complex-C (B-COMPLEX WITH VITAMIN C) tablet Take 1 tablet by mouth daily.    . Blood Glucose Calibration (ACCU-CHEK AVIVA) SOLN Use as directed dx: e11.22 1 each 3  . Blood Glucose Monitoring Suppl (ACCU-CHEK AVIVA PLUS) w/Device KIT Use as directed to check blood sugars 2 times per day dx: e11.22 1 kit 3  . carvedilol (COREG) 25 MG tablet Take 1 tablet (25 mg total) by mouth 2 (two) times daily with a meal. 180 tablet 3  . Cholecalciferol (VITAMIN D-3) 1000 units CAPS Take 1,000 Units by mouth daily.    . diclofenac sodium (VOLTAREN) 1 % GEL Apply 2 g topically 4 (four) times daily as needed (pain).    . DROPLET PEN NEEDLES 31G X 8 MM MISC USE DAILY WITH VICTOZA AND/OR NOVOLOG.  400 each 1  . furosemide (LASIX) 40 MG tablet Take one tablet (40mg) by mouth on Saturday and Sunday only. (Patient taking differently: Take 40 mg by mouth See admin instructions. Take one tablet (40mg) by mouth on Saturday and  Sunday only.) 180 tablet 3  . furosemide (LASIX) 80 MG tablet Take one tablet (80mg) by mouth Monday through Friday only. (Patient taking differently: Take 80 mg by mouth See admin instructions. Take one tablet (80mg) by mouth Monday through Friday only.) 90 tablet 3  . glucose blood (ACCU-CHEK AVIVA PLUS) test strip Use as instructed to check blood sugars 2 times per day dx: e11.22 300 each 3  . hydrALAZINE (APRESOLINE) 100 MG tablet Take 100 mg twice a day , if blood pressure is greater than 150 mmhg take an extra tablet . (Patient taking differently: Take 100 mg by mouth daily as needed. Take 100 mg twice a day as needed if blood pressure is greater than 150 mmhg take an extra tablet .) 90 tablet 3  . Insulin Glargine, 1 Unit Dial, (TOUJEO SOLOSTAR) 300 UNIT/ML SOPN Inject 36 Units into the skin at bedtime. (Patient taking differently: Inject 34 Units into the skin at bedtime. ) 15 pen 2  . Isopropyl Alcohol (ALCOHOL WIPES) 70 % MISC Apply 1 each topically 2 (two) times daily. 300 each 3  . isosorbide mononitrate (IMDUR) 60 MG 24 hr tablet Take 1 tablet (60 mg total) by mouth daily. 90 tablet 3  . lamoTRIgine (LAMICTAL) 100 MG tablet Take 1 tablet twice a day (Take with 25mg twice a day to make a total of 125mg twice a day) 180 tablet 3  . lamoTRIgine (LAMICTAL) 25 MG tablet Take 1 tablet twice a day (Take with 100mg tablet twice a day, to make it 125mg twice a day) 180 tablet 3  . Multiple Vitamins-Minerals (MULTIVITAMIN WITH MINERALS) tablet Take 1 tablet by mouth daily.    . Pancrelipase, Lip-Prot-Amyl, (ZENPEP) 25000-79000 units CPEP Take 1-2 capsules by mouth See admin instructions. Take 2 capsules with meals and 1 capsule with snacks    . potassium chloride SA (K-DUR) 20 MEQ tablet TAKE 2 TABLETS ONCE DAILY, BUT ON THE DAY YOU  TAKE METOLAZONE TAKE AN EXTRA TABLET FOR A TOTAL OF 3 TABLETS 204 tablet 0  . Semaglutide,   1 MG/DOSE, (OZEMPIC, 1 MG/DOSE,) 2 MG/1.5ML SOPN Inject 1 mg into the skin once  a week. 3 pen 1  . sertraline (ZOLOFT) 25 MG tablet Take 25 mg by mouth daily.    . sertraline (ZOLOFT) 50 MG tablet TAKE 1 TABLET EVERY DAY (Patient taking differently: Take 50 mg by mouth at bedtime. ) 90 tablet 2   No current facility-administered medications for this visit.     Allergies:   Other, Penicillins, Sulfa antibiotics, Aspirin, and Codeine    Social History:  The patient  reports that she has quit smoking. Her smoking use included cigarettes. She has a 12.50 pack-year smoking history. She has never used smokeless tobacco. She reports that she does not drink alcohol or use drugs.   Family History:  The patient's family history includes Allergies in her father; Cancer in an other family member; Diabetes in her brother, mother, sister, and son; Heart disease in her father, mother, and sister; Heart failure in her father; Hyperlipidemia in her father, mother, and sister; Hypertension in her brother, father, mother, sister, sister, and son.    ROS: All other systems are reviewed and negative. Unless otherwise mentioned in H&P    PHYSICAL EXAM: VS:  BP 120/63   Pulse 63   Ht 5' 1" (1.549 m)   Wt 219 lb 3.2 oz (99.4 kg)   SpO2 96%   BMI 41.42 kg/m  , BMI Body mass index is 41.42 kg/m. GEN: Well nourished, well developed, in no acute distress HEENT: normal Neck: no JVD, carotid bruits, or masses Cardiac: RRR; no murmurs, rubs, or gallops,no edema  Respiratory:  Clear to auscultation bilaterally, normal, easily dyspneic with talking. GI: soft, nontender, nondistended, + BS MS: no deformity or atrophy, wearing knee-high support hose. Skin: warm and dry, no rash Neuro:  Strength and sensation are intact Psych: euthymic mood, full affect   EKG: Not completed this office visit. Recent Labs: 05/15/2018: B Natriuretic Peptide 153.9 02/25/2019: ALT 16; Hemoglobin 11.5; Magnesium 1.9; Platelets 241; TSH 1.332 02/26/2019: BUN 15; Creatinine, Ser 0.97; Potassium 3.8; Sodium 134     Lipid Panel    Component Value Date/Time   CHOL 145 10/22/2018 1446   TRIG 52 10/22/2018 1446   HDL 70 10/22/2018 1446   CHOLHDL 2.1 10/22/2018 1446   CHOLHDL 2.3 05/15/2018 0600   VLDL 24 05/15/2018 0600   LDLCALC 65 10/22/2018 1446   LDLDIRECT 66.0 01/27/2015 1104      Wt Readings from Last 3 Encounters:  03/31/19 219 lb 3.2 oz (99.4 kg)  03/23/19 227 lb (103 kg)  03/05/19 224 lb 3.2 oz (101.7 kg)      Other studies Reviewed: Echocardiogram 02/25/2019  1. The left ventricle has low normal systolic function, with an ejection fraction of 50-55%. The cavity size was normal. Left ventricular diastolic Doppler parameters are consistent with impaired relaxation. No evidence of left ventricular regional wall  motion abnormalities.  2. Although TAPSE is measured as normal, the right ventricle visually appears to have moderate to severely reduced systolic function. The cavity was severely enlarged. There is no increase in right ventricular wall thickness. Right ventricular systolic  pressure could not be assessed.  3. Left atrial size was mildly dilated.  4. Hypermobile interatrial septum.  5. Right atrial size was severely dilated.  6. The aorta is normal unless otherwise noted.  7. The inferior vena cava was dilated in size with <50% respiratory variability  CATH:05/16/2018  Normal coronary arteries without evidence of obstructive disease.    Upper normal LV systolic function, EF 70%. EDP 21 mmHg. Findings compatible with diastolic heart failure.  Brain MRI: IMPRESSION: 1. No acute intracranial abnormality. 2. Small chronic right posterior frontal cortical infarct. 3. Mild age-related cerebral atrophy with chronic microvascular ischemic disease. 4. Empty sella with prominent arachnoid pits at the sphenoid wings bilaterally. While these findings may be incidental in nature and of no clinical significance, changes such as these can also be seen in the setting of  idiopathic intracranial hypertension (pseudotumor cerebri). Clinical correlation suggested.    ASSESSMENT AND PLAN:  1.  Severely enlarged RV: Has exacerbations of right-sided CHF with worsening breathing status.  I worry about her being on too much preload reducing medication.  However decreasing the dose of Lasix often and causes her to retain significant amount of fluid.  I checked orthostatics today and she is dropping from 129 systolic to 116 systolic with some lightheadedness.  She has been diagnosed with seizure disorder and it was felt that her syncopal episodes and dizziness were related to this.  However I am concerned about dropping her preload in the setting of RV failure.  I have discussed this with Dr. Berry who is DOD today concerning medication adjustments to prevent her from having orthostasis without causing preload to decrease.  He is recommended that she be referred to advanced heart failure clinic for pulmonary artery dilatation medications and more thorough follow-up.  In the interim we are reducing hydralazine from 100 mg twice daily to 75 mg twice daily.  She is continued on Lasix 80 mg daily with 40 mg on Saturdays and Sundays.  She does not have evidence of significant lower extremity edema or abdominal distention.  She will continue with isosorbide and with carvedilol as directed.  Consideration for t medication changes were discussed however Dr. Berry advised the hydralazine would be the first to decrease at this time.  Further recommendations by Dr. Bensimhon/Dr. McClain on follow-up with ACHF clinic.  2.  History of seizure disorder: She is being followed by neurology with history of CVA as well.  3.  OSA on CPAP: She is very compliant with CPAP.  She states she is unable to sleep without it.  She is sleeping upright in a recliner as she does have episodes of PND and orthopnea despite use of CPAP.   Current medicines are reviewed at length with the patient today.     Labs/ tests ordered today include: None  Leoma Folds M. Zaire Levesque DNP, ANP, AACC   03/31/2019 1:40 PM    [facility] [address] Office [phone] Fax [phone] 

## 2019-03-30 NOTE — H&P (View-Only) (Signed)
Cardiology Office Note   Date:  03/31/2019   ID:  [name], DOB Apr 05, 1946, MRN [id number]  PCP:  [name], MD  Cardiologist: Dr. Ellyn Hack  CC:Follow Up   History of Present Illness: [name] is a 73 y.o. female who presents for post hospital follow up after being seen on consultation by Dr. Martinique for complaints of syncope. She has a history of diastolic CHF, PAC's, Seizure Disorder, and prior CVA.  She had a cardiac cath in November of 2019 which did not reveal any significant coronary artery disease. It was felt that her syncopal episode was related to neurologic etiology, possibly seizure.   Additionally, she has a history of type 2 diabetes, OSA on CPAP, New York Heart Association class II diastolic CHF, and dyslipidemia.  She was to have a follow up 2 week, Zio cardiac monitor on discharge, and repeat echocardiogram. [name] called our office on 03/23/2019, and reported that since she was discharged on 02/25/2019, she had experienced worsening dyspnea, also occurring while sitting. She denies edema or lightheadedness. She was instructed by Dr. Ellyn Hack to increase furosemide to 80 mg BID X 1 day, then 80 mg in the am, and 40 mg in the pm for 3 days. Then return to 80 mg daily.   Echocardiogram on 03/15/2019 demonstrated EF of 78%-29%, diastolic dysfunction was noted. She was also noted to have severely reduced right ventricular systolic function with severe cavity enlargement.   She comes to the office today with her sister.They report frequent dizziness and near syncope.  She responded to the increased lasix, but dizziness has worsened. She feels weak. She remains on CPAP at night but has orthopnea and PND, and therefore sleeps sitting up.    Past Medical History:  Diagnosis Date  . Abnormal liver function     in the past.  . Anemia   . Arthritis    all over  . Bruises easily   . Cataract    right eye;immature  . Chronic back pain    stenosis  . Chronic  cough   . Chronic kidney disease   . COPD (chronic obstructive pulmonary disease) ([place])   . Demand myocardial infarction [facility]) 2012   Demand Infarction in setting of Pancreatitis --> mild Troponin elevation, NON-OBSTRUCTIVE CAD  . Depression    takes Abilify daily as well as Zoloft  . Diastolic heart failure 5621   Grade 1 diastolic Dysfunction by Echo   . Diverticulosis   . DM (diabetes mellitus) ([place])    takes Victoza daily as well as Lantus and Humalog  . Empty sella ([place])    on MRI in 2009.  . Glaucoma   . Headache(784.0)    last migraine-4-42yr ago  . History of blood transfusion    no abnormal reaction noted  . History of colon polyps    benign  . HTN (hypertension)    takes BKinder Morgan Energydaily  . Hyperlipidemia    takes Lipitor daily  . Joint swelling   . Nocturia   . Obesity hypoventilation syndrome ([place]   . Obstructive sleep apnea 02/2018   Notably improved split-night study with weight loss from 270 (2017) down to 250 pounds (2019).  . Pancreatitis    takes Pancrelipase daily  . Parkinson's disease ([place]    takes Sinemet daily  . Peripheral neuropathy   . Pneumonia 2012  . Seizures ([place]    takes Lamictal daily and Primidone nightly;last seizure 2wks ago  . Urinary urgency  With increased frequency  . Varicose veins of both lower extremities with pain    With edema.  Takes daily Lasix    Past Surgical History:  Procedure Laterality Date  . ABDOMINAL HYSTERECTOMY    . APPENDECTOMY    . BACK SURGERY    . CARDIAC CATHETERIZATION  2009/March 2012   2009: (Dr. Alvie Heidelberg) Nonobstructive CAD; 2012: Minimal CAD --> false positive stress test  . Cardiac Event Monitor  September-October 2017   Sinus rhythm with occasional PACs and artifact. No arrhythmias besides one short run of tachycardia.  . CHOLECYSTECTOMY    . COLONOSCOPY N/A 08/15/2012   Procedure: COLONOSCOPY;  Surgeon: Arta Silence, MD;  Location: [facility];  Service: Endoscopy;   Laterality: N/A;  . COLONOSCOPY WITH PROPOFOL Left 06/17/2017   Procedure: COLONOSCOPY WITH PROPOFOL;  Surgeon: [name], MD;  Location: [place];  Service: Gastroenterology;  Laterality: Left;  . CORONARY CALCIUM SCORE AND CTA  04/2018   Coronary calcium score 71.9.  Very large, hyperdynamic LAD wrapping apex giving rise to PDA.  Moderate LAD-diagonal and circumflex plaque -> CT FFR suggested positive findings in distal D1, D2 and distal circumflex.  Referred for cath. ==> FALSE POSITIVE  . ESOPHAGOGASTRODUODENOSCOPY    . eye cysts Bilateral   . LASIK    . LEFT HEART CATH AND CORONARY ANGIOGRAPHY N/A 05/16/2018   Procedure: LEFT HEART CATH AND CORONARY ANGIOGRAPHY;  Surgeon: Belva Crome, MD;  Location: [facility]:  Angiographically normal coronary arteries with LVEDP of 21 mmHg.  Large draping hyperdominant LAD that wraps the apex and provides distal half of the PDA.  Relatively small caliber distal Cx -->proxLPDA, non-dom RCA. No Cx or Diag lesions -- FALSE + CT FFR  . NM MYOVIEW LTD  01/2016; 01/2018   a)LOW RISK. No ischemia or infarction;; b) EF 55-60%. NO ST changes.  No ischemia or Infarction. NO significant RV enlargement.  LOW RISK.  Marland Kitchen Polysomnogram  02/2018   (Dr. Brett Fairy from neurology): Split study was not adequately done due to low AHI.  She slept reclined and had an AHI less than 10, prolonged hypoxemia and no hypercapnia. -->  She has home oxygen already prescribed, but did not meet criteria for nightly oxygen.  (Was noted that the patient did not cooperate well with study.  Titration was discussed.  Marland Kitchen RECTAL POLYPECTOMY    . TONSILLECTOMY    . TRANSTHORACIC ECHOCARDIOGRAM  01/2016; 05/2017   A) Normal LV chamber size with mod LVH pattern. EF 50-55%. Severe LA dilation. Mod RA dilation. PAP elevated at 38 mmHg;; B) Mild concentric LVH.  EF 55-60% & no RWMA.  Grade 2 DD.  Diastolic flattening of the ventricular septum == ? elevated PAP.  Mild aortic valve  calcification/sclerosis.  Mod LA dilation.  Mild RV dilation & Mod RA dilation  . TRANSTHORACIC ECHOCARDIOGRAM  10/2017; 01/2018   a) EF 55-60%.  No RWMA,  Moderate LA dilation.  Mild LA dilation.  Peak PA pressures ~57 mmHg (moderate);;; b) EF 55-60%.  GRII DD.  Suggestion of RV volume overload with moderate RV dilation.  Severe LA and RA dilation.Marland Kitchen  PA pressure ~67%.  . TUBAL LIGATION    . vaginal cyst removed     several times     Current Outpatient Medications  Medication Sig Dispense Refill  . Accu-Chek Softclix Lancets lancets Use as instructed to check blood sugars 2 times per day dx: e11.22 300 each 3  . acetaminophen (TYLENOL) 500 MG tablet  Take 500 mg by mouth every 4 (four) hours as needed (for pain).     Marland Kitchen albuterol (PROVENTIL HFA;VENTOLIN HFA) 108 (90 Base) MCG/ACT inhaler Inhale 1-2 puffs into the lungs every 6 (six) hours as needed for wheezing or shortness of breath (DOE WHEEZING SOB). 1 Inhaler 1  . atorvastatin (LIPITOR) 10 MG tablet TAKE 1 TABLET EVERY DAY 90 tablet 1  . B Complex-C (B-COMPLEX WITH VITAMIN C) tablet Take 1 tablet by mouth daily.    . Blood Glucose Calibration (ACCU-CHEK AVIVA) SOLN Use as directed dx: e11.22 1 each 3  . Blood Glucose Monitoring Suppl (ACCU-CHEK AVIVA PLUS) w/Device KIT Use as directed to check blood sugars 2 times per day dx: e11.22 1 kit 3  . carvedilol (COREG) 25 MG tablet Take 1 tablet (25 mg total) by mouth 2 (two) times daily with a meal. 180 tablet 3  . Cholecalciferol (VITAMIN D-3) 1000 units CAPS Take 1,000 Units by mouth daily.    . diclofenac sodium (VOLTAREN) 1 % GEL Apply 2 g topically 4 (four) times daily as needed (pain).    . DROPLET PEN NEEDLES 31G X 8 MM MISC USE DAILY WITH VICTOZA AND/OR NOVOLOG.  400 each 1  . furosemide (LASIX) 40 MG tablet Take one tablet (28m) by mouth on Saturday and Sunday only. (Patient taking differently: Take 40 mg by mouth See admin instructions. Take one tablet (488m by mouth on Saturday and  Sunday only.) 180 tablet 3  . furosemide (LASIX) 80 MG tablet Take one tablet (8055mby mouth Monday through Friday only. (Patient taking differently: Take 80 mg by mouth See admin instructions. Take one tablet (31m52my mouth Monday through Friday only.) 90 tablet 3  . glucose blood (ACCU-CHEK AVIVA PLUS) test strip Use as instructed to check blood sugars 2 times per day dx: e11.22 300 each 3  . hydrALAZINE (APRESOLINE) 100 MG tablet Take 100 mg twice a day , if blood pressure is greater than 150 mmhg take an extra tablet . (Patient taking differently: Take 100 mg by mouth daily as needed. Take 100 mg twice a day as needed if blood pressure is greater than 150 mmhg take an extra tablet .) 90 tablet 3  . Insulin Glargine, 1 Unit Dial, (TOUJEO SOLOSTAR) 300 UNIT/ML SOPN Inject 36 Units into the skin at bedtime. (Patient taking differently: Inject 34 Units into the skin at bedtime. ) 15 pen 2  . Isopropyl Alcohol (ALCOHOL WIPES) 70 % MISC Apply 1 each topically 2 (two) times daily. 300 each 3  . isosorbide mononitrate (IMDUR) 60 MG 24 hr tablet Take 1 tablet (60 mg total) by mouth daily. 90 tablet 3  . lamoTRIgine (LAMICTAL) 100 MG tablet Take 1 tablet twice a day (Take with 25mg65mce a day to make a total of 125mg 67me a day) 180 tablet 3  . lamoTRIgine (LAMICTAL) 25 MG tablet Take 1 tablet twice a day (Take with 100mg t30mt twice a day, to make it 125mg tw73ma day) 180 tablet 3  . Multiple Vitamins-Minerals (MULTIVITAMIN WITH MINERALS) tablet Take 1 tablet by mouth daily.    . Pancrelipase, Lip-Prot-Amyl, (ZENPEP) 25000-79000 units CPEP Take 1-2 capsules by mouth See admin instructions. Take 2 capsules with meals and 1 capsule with snacks    . potassium chloride SA (K-DUR) 20 MEQ tablet TAKE 2 TABLETS ONCE DAILY, BUT ON THE DAY YOU  TAKE METOLAZONE TAKE AN EXTRA TABLET FOR A TOTAL OF 3 TABLETS 204 tablet 0  . Semaglutide,  1 MG/DOSE, (OZEMPIC, 1 MG/DOSE,) 2 MG/1.5ML SOPN Inject 1 mg into the skin once  a week. 3 pen 1  . sertraline (ZOLOFT) 25 MG tablet Take 25 mg by mouth daily.    . sertraline (ZOLOFT) 50 MG tablet TAKE 1 TABLET EVERY DAY (Patient taking differently: Take 50 mg by mouth at bedtime. ) 90 tablet 2   No current facility-administered medications for this visit.     Allergies:   Other, Penicillins, Sulfa antibiotics, Aspirin, and Codeine    Social History:  The patient  reports that she has quit smoking. Her smoking use included cigarettes. She has a 12.50 pack-year smoking history. She has never used smokeless tobacco. She reports that she does not drink alcohol or use drugs.   Family History:  The patient's family history includes Allergies in her father; Cancer in an other family member; Diabetes in her brother, mother, sister, and son; Heart disease in her father, mother, and sister; Heart failure in her father; Hyperlipidemia in her father, mother, and sister; Hypertension in her brother, father, mother, sister, sister, and son.    ROS: All other systems are reviewed and negative. Unless otherwise mentioned in H&P    PHYSICAL EXAM: VS:  BP 120/63   Pulse 63   Ht _0  (1.549 m)   Wt 219 lb 3.2 oz (99.4 kg)   SpO2 96%   BMI 41.42 kg/m  , BMI Body mass index is 41.42 kg/m. GEN: Well nourished, well developed, in no acute distress HEENT: normal Neck: no JVD, carotid bruits, or masses Cardiac: RRR; no murmurs, rubs, or gallops,no edema  Respiratory:  Clear to auscultation bilaterally, normal, easily dyspneic with talking. GI: soft, nontender, nondistended, + BS MS: no deformity or atrophy, wearing knee-high support hose. Skin: warm and dry, no rash Neuro:  Strength and sensation are intact Psych: euthymic mood, full affect   EKG: Not completed this office visit. Recent Labs: 05/15/2018: B Natriuretic Peptide 153.9 03/03/19: ALT 16; Hemoglobin 11.5; Magnesium 1.9; Platelets 241; TSH 1.332 02/26/2019: BUN 15; Creatinine, Ser 0.97; Potassium 3.8; Sodium 134     Lipid Panel    Component Value Date/Time   CHOL 145 10/22/2018 1446   TRIG 52 10/22/2018 1446   HDL 70 10/22/2018 1446   CHOLHDL 2.1 10/22/2018 1446   CHOLHDL 2.3 05/15/2018 0600   VLDL 24 05/15/2018 0600   LDLCALC 65 10/22/2018 1446   LDLDIRECT 66.0 01/27/2015 1104      Wt Readings from Last 3 Encounters:  03/31/19 219 lb 3.2 oz (99.4 kg)  03/23/19 227 lb (103 kg)  03/05/19 224 lb 3.2 oz (101.7 kg)      Other studies Reviewed: Echocardiogram 2019-03-03  1. The left ventricle has low normal systolic function, with an ejection fraction of 50-55%. The cavity size was normal. Left ventricular diastolic Doppler parameters are consistent with impaired relaxation. No evidence of left ventricular regional wall  motion abnormalities.  2. Although TAPSE is measured as normal, the right ventricle visually appears to have moderate to severely reduced systolic function. The cavity was severely enlarged. There is no increase in right ventricular wall thickness. Right ventricular systolic  pressure could not be assessed.  3. Left atrial size was mildly dilated.  4. Hypermobile interatrial septum.  5. Right atrial size was severely dilated.  6. The aorta is normal unless otherwise noted.  7. The inferior vena cava was dilated in size with <50% respiratory variability  CATH:05/16/2018  Normal coronary arteries without evidence of obstructive disease.  Upper normal LV systolic function, EF 41%. EDP 21 mmHg. Findings compatible with diastolic heart failure.  Brain MRI: IMPRESSION: 1. No acute intracranial abnormality. 2. Small chronic right posterior frontal cortical infarct. 3. Mild age-related cerebral atrophy with chronic microvascular ischemic disease. 4. Empty sella with prominent arachnoid pits at the sphenoid wings bilaterally. While these findings may be incidental in nature and of no clinical significance, changes such as these can also be seen in the setting of  idiopathic intracranial hypertension (pseudotumor cerebri). Clinical correlation suggested.    ASSESSMENT AND PLAN:  1.  Severely enlarged RV: Has exacerbations of right-sided CHF with worsening breathing status.  I worry about her being on too much preload reducing medication.  However decreasing the dose of Lasix often and causes her to retain significant amount of fluid.  I checked orthostatics today and she is dropping from 324 systolic to 401 systolic with some lightheadedness.  She has been diagnosed with seizure disorder and it was felt that her syncopal episodes and dizziness were related to this.  However I am concerned about dropping her preload in the setting of RV failure.  I have discussed this with Dr. Gwenlyn Found who is DOD today concerning medication adjustments to prevent her from having orthostasis without causing preload to decrease.  He is recommended that she be referred to advanced heart failure clinic for pulmonary artery dilatation medications and more thorough follow-up.  In the interim we are reducing hydralazine from 100 mg twice daily to 75 mg twice daily.  She is continued on Lasix 80 mg daily with 40 mg on Saturdays and Sundays.  She does not have evidence of significant lower extremity edema or abdominal distention.  She will continue with isosorbide and with carvedilol as directed.  Consideration for t medication changes were discussed however Dr. Gwenlyn Found advised the hydralazine would be the first to decrease at this time.  Further recommendations by Dr. Ardyth Man. Algernon Huxley on follow-up with [facility] clinic.  2.  History of seizure disorder: She is being followed by neurology with history of CVA as well.  3.  OSA on CPAP: She is very compliant with CPAP.  She states she is unable to sleep without it.  She is sleeping upright in a recliner as she does have episodes of PND and orthopnea despite use of CPAP.   Current medicines are reviewed at length with the patient today.     Labs/ tests ordered today include: None  Phill Myron. [place] Pugh, ANP, AACC   03/31/2019 1:40 PM    [facility] [address] Office [phone] Fax [phone]

## 2019-03-31 ENCOUNTER — Encounter: Payer: Self-pay | Admitting: Adult Health

## 2019-03-31 ENCOUNTER — Other Ambulatory Visit: Payer: Self-pay

## 2019-03-31 ENCOUNTER — Ambulatory Visit (INDEPENDENT_AMBULATORY_CARE_PROVIDER_SITE_OTHER): Payer: Medicare HMO | Admitting: Adult Health

## 2019-03-31 VITALS — BP 120/63 | HR 63 | Ht 61.0 in | Wt 219.2 lb

## 2019-03-31 DIAGNOSIS — I50812 Chronic right heart failure: Secondary | ICD-10-CM | POA: Diagnosis not present

## 2019-03-31 DIAGNOSIS — R55 Syncope and collapse: Secondary | ICD-10-CM

## 2019-03-31 DIAGNOSIS — I5032 Chronic diastolic (congestive) heart failure: Secondary | ICD-10-CM

## 2019-03-31 DIAGNOSIS — I43 Cardiomyopathy in diseases classified elsewhere: Secondary | ICD-10-CM | POA: Diagnosis not present

## 2019-03-31 DIAGNOSIS — I110 Hypertensive heart disease with heart failure: Secondary | ICD-10-CM

## 2019-03-31 MED ORDER — HYDRALAZINE HCL 50 MG PO TABS: 75.0000 mg | ORAL_TABLET | Freq: Two times a day (BID) | ORAL | 3 refills | Status: DC

## 2019-03-31 NOTE — Patient Instructions (Addendum)
Medication Instructions:  DECREASE- Hydralazine 75 mg(1 1/2 tablets) by mouth twice a day  If you need a refill on your cardiac medications before your next appointment, please call your pharmacy.  Labwork: None Ordered   Testing/Procedures: None Ordered  Follow-Up: . You have been referred to Heart Failure Clinic . Your physician recommends that you schedule a follow-up appointment in: 3 Months with Dr Ellyn Hack   At [facility], you and your health needs are our priority.  As part of our continuing mission to provide you with exceptional heart care, we have created designated Provider Care Teams.  These Care Teams include your primary Cardiologist (physician) and Advanced Practice Providers (APPs -  Physician Assistants and Nurse Practitioners) who all work together to provide you with the care you need, when you need it.  Thank you for choosing [facility] at [facility]!!

## 2019-04-01 ENCOUNTER — Other Ambulatory Visit: Payer: Self-pay | Admitting: Adult Health

## 2019-04-01 ENCOUNTER — Telehealth: Payer: Self-pay | Admitting: *Deleted

## 2019-04-01 ENCOUNTER — Telehealth: Payer: Self-pay | Admitting: Adult Health

## 2019-04-01 DIAGNOSIS — G40009 Localization-related (focal) (partial) idiopathic epilepsy and epileptic syndromes with seizures of localized onset, not intractable, without status epilepticus: Secondary | ICD-10-CM | POA: Diagnosis not present

## 2019-04-01 DIAGNOSIS — I130 Hypertensive heart and chronic kidney disease with heart failure and stage 1 through stage 4 chronic kidney disease, or unspecified chronic kidney disease: Secondary | ICD-10-CM | POA: Diagnosis not present

## 2019-04-01 DIAGNOSIS — I5032 Chronic diastolic (congestive) heart failure: Secondary | ICD-10-CM | POA: Diagnosis not present

## 2019-04-01 DIAGNOSIS — E1142 Type 2 diabetes mellitus with diabetic polyneuropathy: Secondary | ICD-10-CM | POA: Diagnosis not present

## 2019-04-01 DIAGNOSIS — Z01812 Encounter for preprocedural laboratory examination: Secondary | ICD-10-CM

## 2019-04-01 DIAGNOSIS — G8384 Todd's paralysis (postepileptic): Secondary | ICD-10-CM | POA: Diagnosis not present

## 2019-04-01 DIAGNOSIS — J432 Centrilobular emphysema: Secondary | ICD-10-CM | POA: Diagnosis not present

## 2019-04-01 DIAGNOSIS — G20 Parkinson's disease: Secondary | ICD-10-CM | POA: Diagnosis not present

## 2019-04-01 DIAGNOSIS — I69354 Hemiplegia and hemiparesis following cerebral infarction affecting left non-dominant side: Secondary | ICD-10-CM | POA: Diagnosis not present

## 2019-04-01 DIAGNOSIS — I0981 Rheumatic heart failure: Secondary | ICD-10-CM | POA: Diagnosis not present

## 2019-04-01 NOTE — Telephone Encounter (Signed)
[name],  I will be happy to work her in next week for a visit and discuss cath. Thanks -dan

## 2019-04-01 NOTE — Telephone Encounter (Signed)
 @  [facility] [address] [place] [address] Dept: [phone] Loc: [place]  04/01/2019  You are scheduled for a Cardiac Catheterization on Monday, October 12 with Dr. Glenetta Hew.  1. Please arrive at the [facility] (Main Entrance A) at [facility]: [address] [place], [place] [address] at 8:30 AM (This time is two hours before your procedure to ensure your preparation). Free valet parking service is available.   Special note: Every effort is made to have your procedure done on time. Please understand that emergencies sometimes delay scheduled procedures.  2. Diet: Do not eat solid foods after midnight.  The patient may have clear liquids until 5am upon the day of the procedure.  3. Labs: You will need to have blood drawn on Friday, October 9 at [place]  Open: [place] (Lunch 12:30 - 1:30)   Phone: [phone]. You do not need to be fasting.  You will need a COVID19 test done: APPT TIME 2:10 PM This is a Drive Up Visit at the [facility] [address] Someone will direct you to the appropriate testing line. Stay in your car and someone will be with you shortly.  4. Medication instructions in preparation for your procedure:   Contrast Allergy: No  Take only 14 units of insulin the night before your procedure. Do not take any insulin on the day of the procedure.  HOLD FUROSEMIDE DAY OF PROCEDURE  On the morning of your procedure, take your any morning medicines NOT listed above.  You may use sips of water.  5. Plan for one night stay--bring personal belongings. 6. Bring a current list of your medications and current insurance cards. 7. You MUST have a responsible person to drive you home. 8. Someone MUST be with you the first 24 hours after you arrive home or your discharge will be  delayed. 9. Please wear clothes that are easy to get on and off and wear slip-on shoes.  Thank you for allowing [place] to care for you!   -- [facility]

## 2019-04-01 NOTE — Telephone Encounter (Signed)
I have discussed this case with Dr. Ellyn Hack, primary cardiologist, concerning management of medications and need for further testing concerning RV failure along with diastolic dysfunction.  It is his recommendation that the patient have a right and left heart cath without course for intracardiac pressures to allow for better evaluation of her status.  He would also like to have Dr. Jeffie Pollock or Dr. [name] in attendance as well to discuss findings.  I have called [name] and spoken with her and her sister, explained the reasoning behind her need to have this procedure.  I have also discussed that Dr. Ellyn Hack would be performing the procedure in attendance with the heart failure specialist.  She and her sister discussed that and she is willing to proceed.  The patient will be scheduled for right and left heart cath next week to be added to Dr. Ellyn Hack schedule.  I am CCing this note along with the patient's chart to Dr. [name] and Dr. Haroldine Laws.

## 2019-04-02 ENCOUNTER — Telehealth: Payer: Self-pay | Admitting: *Deleted

## 2019-04-02 NOTE — Progress Notes (Signed)
That would be fine.  When?

## 2019-04-02 NOTE — Telephone Encounter (Signed)
Thanks a Academic librarian.  She is somewhat difficult to manage and this most recent echo showing pretty significant RV issues is concerning.  I got her posted for right and left heart cath on Monday.  Thanks to [place] for following up on this.  Glenetta Hew, MD

## 2019-04-02 NOTE — Telephone Encounter (Signed)
Pt contacted pre-catheterization scheduled at [facility] [id number] April 06, 2019 10:30 AM Verified arrival time and place: [facility]) at: 8:30 AM   No solid food after midnight prior to cath, clear liquids until 5 AM day of procedure. Contrast allergy: no  Hold: Lasix/KCl-AM of procedure. Insulin-1/2 usual HS Insulin Pt states she does not use Insulin in the AM or take any other AM diabetes medications.  Except hold medications AM meds can be  taken pre-cath with sip of water including: ASA 81 mg-pt states she has a history of GI bleed several years ago after taking ASA long term and has not taken since, pt states she feels comfortable taking 1 ASA 81 mg AM of procedure.   Confirmed patient has responsible adult to drive home post procedure and observe 24 hours after arriving home: yes  Currently, due to Covid-19 pandemic, only one support person will be allowed with patient. Must be the same support person for that patient's entire stay, will be screened and required to wear a mask. They will be asked to wait in the waiting room for the duration of the patient's stay.  Patients are required to wear a mask when they enter the hospital.      COVID-19 Pre-Screening Questions:  . In the past 7 to 10 days have you had a cough,  shortness of breath, headache, congestion, fever (100 or greater) body aches, chills, sore throat, or sudden loss of taste or sense of smell? no . Have you been around anyone with known Covid 19? no . Have you been around anyone who is awaiting Covid 19 test results in the past 7 to 10 days? no . Have you been around anyone who has been exposed to Covid 19, or has mentioned symptoms of Covid 19 within the past 7 to 10 days? no    I reviewed procedure/mask/visitor, Covid-19 screening questions with patient, she verbalized understanding, thanked me for call.

## 2019-04-02 NOTE — Telephone Encounter (Signed)
R & L heart cath without cors is scheduled for 04/06/2019 at 8:30 am. I have asked the staff to notify Dr.Bensimohn when the patient has arrived so that he can plan to be present.  Please let me know if there is anything else that I can do to help.

## 2019-04-03 ENCOUNTER — Other Ambulatory Visit (HOSPITAL_COMMUNITY)
Admission: RE | Admit: 2019-04-03 | Discharge: 2019-04-03 | Disposition: A | Discharge status: Home / Self Care | Payer: Medicare HMO | Source: Ambulatory Visit | Attending: Cardiology | Admitting: Cardiology

## 2019-04-03 ENCOUNTER — Other Ambulatory Visit (HOSPITAL_COMMUNITY): Payer: Medicare HMO

## 2019-04-03 DIAGNOSIS — I5032 Chronic diastolic (congestive) heart failure: Secondary | ICD-10-CM | POA: Diagnosis not present

## 2019-04-03 DIAGNOSIS — Z20828 Contact with and (suspected) exposure to other viral communicable diseases: Secondary | ICD-10-CM | POA: Diagnosis not present

## 2019-04-03 DIAGNOSIS — Z01812 Encounter for preprocedural laboratory examination: Secondary | ICD-10-CM | POA: Diagnosis not present

## 2019-04-04 DIAGNOSIS — G4733 Obstructive sleep apnea (adult) (pediatric): Secondary | ICD-10-CM | POA: Diagnosis not present

## 2019-04-04 LAB — CBC
Hematocrit: 36.6 % (ref 34.0–46.6)
Hemoglobin: 11.4 g/dL (ref 11.1–15.9)
MCH: 23 pg — ABNORMAL LOW (ref 26.6–33.0)
MCHC: 31.1 g/dL — ABNORMAL LOW (ref 31.5–35.7)
MCV: 74 fL — ABNORMAL LOW (ref 79–97)
Platelets: 310 10*3/uL (ref 150–450)
RBC: 4.95 x10E6/uL (ref 3.77–5.28)
RDW: 14.7 % (ref 11.7–15.4)
WBC: 4.4 10*3/uL (ref 3.4–10.8)

## 2019-04-04 LAB — BASIC METABOLIC PANEL
BUN/Creatinine Ratio: 17 (ref 12–28)
BUN: 20 mg/dL (ref 8–27)
CO2: 29 mmol/L (ref 20–29)
Calcium: 9.7 mg/dL (ref 8.7–10.3)
Chloride: 97 mmol/L (ref 96–106)
Creatinine, Ser: 1.15 mg/dL — ABNORMAL HIGH (ref 0.57–1.00)
GFR calc Af Amer: 56 mL/min/{1.73_m2} — ABNORMAL LOW (ref >59)
GFR calc non Af Amer: 48 mL/min/{1.73_m2} — ABNORMAL LOW (ref >59)
Glucose: 130 mg/dL — ABNORMAL HIGH (ref 65–99)
Potassium: 4.1 mmol/L (ref 3.5–5.2)
Sodium: 138 mmol/L (ref 134–144)

## 2019-04-06 ENCOUNTER — Ambulatory Visit: Payer: Medicare HMO | Admitting: Internal Medicine

## 2019-04-06 ENCOUNTER — Encounter (HOSPITAL_COMMUNITY)
Admission: RE | Disposition: A | Discharge status: Home / Self Care | Payer: Self-pay | Source: Home / Self Care | Attending: Cardiology

## 2019-04-06 ENCOUNTER — Other Ambulatory Visit: Payer: Self-pay

## 2019-04-06 ENCOUNTER — Ambulatory Visit (HOSPITAL_COMMUNITY)
Admission: RE | Admit: 2019-04-06 | Discharge: 2019-04-06 | Disposition: A | Discharge status: Home / Self Care | Payer: Medicare HMO | Attending: Cardiology | Admitting: Cardiology

## 2019-04-06 DIAGNOSIS — F329 Major depressive disorder, single episode, unspecified: Secondary | ICD-10-CM | POA: Diagnosis not present

## 2019-04-06 DIAGNOSIS — I130 Hypertensive heart and chronic kidney disease with heart failure and stage 1 through stage 4 chronic kidney disease, or unspecified chronic kidney disease: Secondary | ICD-10-CM | POA: Insufficient documentation

## 2019-04-06 DIAGNOSIS — Z6841 Body Mass Index (BMI) 40.0 and over, adult: Secondary | ICD-10-CM | POA: Insufficient documentation

## 2019-04-06 DIAGNOSIS — E785 Hyperlipidemia, unspecified: Secondary | ICD-10-CM | POA: Diagnosis not present

## 2019-04-06 DIAGNOSIS — E1122 Type 2 diabetes mellitus with diabetic chronic kidney disease: Secondary | ICD-10-CM | POA: Insufficient documentation

## 2019-04-06 DIAGNOSIS — E662 Morbid (severe) obesity with alveolar hypoventilation: Secondary | ICD-10-CM | POA: Diagnosis not present

## 2019-04-06 DIAGNOSIS — E1136 Type 2 diabetes mellitus with diabetic cataract: Secondary | ICD-10-CM | POA: Diagnosis not present

## 2019-04-06 DIAGNOSIS — N189 Chronic kidney disease, unspecified: Secondary | ICD-10-CM | POA: Insufficient documentation

## 2019-04-06 DIAGNOSIS — Z880 Allergy status to penicillin: Secondary | ICD-10-CM | POA: Diagnosis not present

## 2019-04-06 DIAGNOSIS — Z833 Family history of diabetes mellitus: Secondary | ICD-10-CM | POA: Insufficient documentation

## 2019-04-06 DIAGNOSIS — G40909 Epilepsy, unspecified, not intractable, without status epilepticus: Secondary | ICD-10-CM | POA: Insufficient documentation

## 2019-04-06 DIAGNOSIS — Z90710 Acquired absence of both cervix and uterus: Secondary | ICD-10-CM | POA: Insufficient documentation

## 2019-04-06 DIAGNOSIS — I5030 Unspecified diastolic (congestive) heart failure: Secondary | ICD-10-CM | POA: Diagnosis not present

## 2019-04-06 DIAGNOSIS — Z886 Allergy status to analgesic agent status: Secondary | ICD-10-CM | POA: Diagnosis not present

## 2019-04-06 DIAGNOSIS — Z885 Allergy status to narcotic agent status: Secondary | ICD-10-CM | POA: Diagnosis not present

## 2019-04-06 DIAGNOSIS — Z87891 Personal history of nicotine dependence: Secondary | ICD-10-CM | POA: Insufficient documentation

## 2019-04-06 DIAGNOSIS — Z79899 Other long term (current) drug therapy: Secondary | ICD-10-CM | POA: Diagnosis not present

## 2019-04-06 DIAGNOSIS — I110 Hypertensive heart disease with heart failure: Secondary | ICD-10-CM | POA: Diagnosis present

## 2019-04-06 DIAGNOSIS — E1142 Type 2 diabetes mellitus with diabetic polyneuropathy: Secondary | ICD-10-CM | POA: Diagnosis not present

## 2019-04-06 DIAGNOSIS — I25118 Atherosclerotic heart disease of native coronary artery with other forms of angina pectoris: Secondary | ICD-10-CM | POA: Diagnosis present

## 2019-04-06 DIAGNOSIS — G20 Parkinson's disease: Secondary | ICD-10-CM | POA: Diagnosis not present

## 2019-04-06 DIAGNOSIS — M1990 Unspecified osteoarthritis, unspecified site: Secondary | ICD-10-CM | POA: Diagnosis not present

## 2019-04-06 DIAGNOSIS — Z882 Allergy status to sulfonamides status: Secondary | ICD-10-CM | POA: Insufficient documentation

## 2019-04-06 DIAGNOSIS — I5032 Chronic diastolic (congestive) heart failure: Secondary | ICD-10-CM | POA: Diagnosis not present

## 2019-04-06 DIAGNOSIS — J449 Chronic obstructive pulmonary disease, unspecified: Secondary | ICD-10-CM | POA: Insufficient documentation

## 2019-04-06 DIAGNOSIS — Z794 Long term (current) use of insulin: Secondary | ICD-10-CM | POA: Insufficient documentation

## 2019-04-06 DIAGNOSIS — I272 Pulmonary hypertension, unspecified: Secondary | ICD-10-CM | POA: Diagnosis not present

## 2019-04-06 DIAGNOSIS — Z9989 Dependence on other enabling machines and devices: Secondary | ICD-10-CM | POA: Diagnosis present

## 2019-04-06 DIAGNOSIS — G4733 Obstructive sleep apnea (adult) (pediatric): Secondary | ICD-10-CM | POA: Diagnosis present

## 2019-04-06 DIAGNOSIS — Z8249 Family history of ischemic heart disease and other diseases of the circulatory system: Secondary | ICD-10-CM | POA: Insufficient documentation

## 2019-04-06 HISTORY — PX: RIGHT/LEFT HEART CATH AND CORONARY ANGIOGRAPHY: CATH118266

## 2019-04-06 LAB — GLUCOSE, CAPILLARY: Glucose-Capillary: 143 mg/dL — ABNORMAL HIGH (ref 70–99)

## 2019-04-06 LAB — POCT I-STAT EG7
Acid-Base Excess: 1 mmol/L (ref 0.0–2.0)
Acid-Base Excess: 3 mmol/L — ABNORMAL HIGH (ref 0.0–2.0)
Bicarbonate: 27 mmol/L (ref 20.0–28.0)
Bicarbonate: 29 mmol/L — ABNORMAL HIGH (ref 20.0–28.0)
Calcium, Ion: 1.16 mmol/L (ref 1.15–1.40)
Calcium, Ion: 1.28 mmol/L (ref 1.15–1.40)
HCT: 31 % — ABNORMAL LOW (ref 36.0–46.0)
HCT: 33 % — ABNORMAL LOW (ref 36.0–46.0)
Hemoglobin: 10.5 g/dL — ABNORMAL LOW (ref 12.0–15.0)
Hemoglobin: 11.2 g/dL — ABNORMAL LOW (ref 12.0–15.0)
O2 Saturation: 74 %
O2 Saturation: 77 %
Potassium: 3.3 mmol/L — ABNORMAL LOW (ref 3.5–5.1)
Potassium: 3.6 mmol/L (ref 3.5–5.1)
Sodium: 139 mmol/L (ref 135–145)
Sodium: 142 mmol/L (ref 135–145)
TCO2: 28 mmol/L (ref 22–32)
TCO2: 30 mmol/L (ref 22–32)
pCO2, Ven: 48 mmHg (ref 44.0–60.0)
pCO2, Ven: 49.9 mmHg (ref 44.0–60.0)
pH, Ven: 7.357 (ref 7.250–7.430)
pH, Ven: 7.372 (ref 7.250–7.430)
pO2, Ven: 41 mmHg (ref 32.0–45.0)
pO2, Ven: 43 mmHg (ref 32.0–45.0)

## 2019-04-06 LAB — POCT I-STAT 7, (LYTES, BLD GAS, ICA,H+H)
Acid-Base Excess: 3 mmol/L — ABNORMAL HIGH (ref 0.0–2.0)
Bicarbonate: 28.1 mmol/L — ABNORMAL HIGH (ref 20.0–28.0)
Calcium, Ion: 1.29 mmol/L (ref 1.15–1.40)
HCT: 33 % — ABNORMAL LOW (ref 36.0–46.0)
Hemoglobin: 11.2 g/dL — ABNORMAL LOW (ref 12.0–15.0)
O2 Saturation: 91 %
Potassium: 3.7 mmol/L (ref 3.5–5.1)
Sodium: 138 mmol/L (ref 135–145)
TCO2: 29 mmol/L (ref 22–32)
pCO2 arterial: 44.5 mmHg (ref 32.0–48.0)
pH, Arterial: 7.408 (ref 7.350–7.450)
pO2, Arterial: 61 mmHg — ABNORMAL LOW (ref 83.0–108.0)

## 2019-04-06 LAB — NOVEL CORONAVIRUS, NAA (HOSP ORDER, SEND-OUT TO REF LAB; TAT 18-24 HRS): SARS-CoV-2, NAA: NOT DETECTED

## 2019-04-06 SURGERY — RIGHT/LEFT HEART CATH AND CORONARY ANGIOGRAPHY
Anesthesia: LOCAL

## 2019-04-06 MED ORDER — SODIUM CHLORIDE 0.9% FLUSH: 3.0000 mL | INTRAVENOUS | Status: DC | PRN

## 2019-04-06 MED ORDER — HEPARIN SODIUM (PORCINE) 1000 UNIT/ML IJ SOLN
INTRAMUSCULAR | Status: AC
  Filled 2019-04-06: qty 1

## 2019-04-06 MED ORDER — IOHEXOL 350 MG/ML SOLN
INTRAVENOUS | Status: DC | PRN
  Administered 2019-04-06: 14:00:00 55 mL

## 2019-04-06 MED ORDER — SODIUM CHLORIDE 0.9 % IV SOLN: 250.0000 mL | INTRAVENOUS | Status: DC | PRN

## 2019-04-06 MED ORDER — HEPARIN (PORCINE) IN NACL 1000-0.9 UT/500ML-% IV SOLN
INTRAVENOUS | Status: AC
  Filled 2019-04-06: qty 1000

## 2019-04-06 MED ORDER — LIDOCAINE HCL (PF) 1 % IJ SOLN
INTRAMUSCULAR | Status: DC | PRN
  Administered 2019-04-06 (×2): 2 mL

## 2019-04-06 MED ORDER — MIDAZOLAM HCL 2 MG/2ML IJ SOLN
INTRAMUSCULAR | Status: AC
  Filled 2019-04-06: qty 2

## 2019-04-06 MED ORDER — LABETALOL HCL 5 MG/ML IV SOLN: 10.0000 mg | INTRAVENOUS | Status: DC | PRN

## 2019-04-06 MED ORDER — FENTANYL CITRATE (PF) 100 MCG/2ML IJ SOLN
INTRAMUSCULAR | Status: DC | PRN
  Administered 2019-04-06: 25 ug via INTRAVENOUS

## 2019-04-06 MED ORDER — HYDRALAZINE HCL 20 MG/ML IJ SOLN: 10.0000 mg | INTRAMUSCULAR | Status: DC | PRN

## 2019-04-06 MED ORDER — HEPARIN SODIUM (PORCINE) 1000 UNIT/ML IJ SOLN
INTRAMUSCULAR | Status: DC | PRN
  Administered 2019-04-06: 5000 [IU] via INTRAVENOUS

## 2019-04-06 MED ORDER — VERAPAMIL HCL 2.5 MG/ML IV SOLN
INTRAVENOUS | Status: AC
  Filled 2019-04-06: qty 2

## 2019-04-06 MED ORDER — ONDANSETRON HCL 4 MG/2ML IJ SOLN: 4.0000 mg | Freq: Four times a day (QID) | INTRAMUSCULAR | Status: DC | PRN

## 2019-04-06 MED ORDER — FENTANYL CITRATE (PF) 100 MCG/2ML IJ SOLN
INTRAMUSCULAR | Status: AC
  Filled 2019-04-06: qty 2

## 2019-04-06 MED ORDER — LIDOCAINE HCL (PF) 1 % IJ SOLN
INTRAMUSCULAR | Status: AC
  Filled 2019-04-06: qty 30

## 2019-04-06 MED ORDER — VERAPAMIL HCL 2.5 MG/ML IV SOLN
INTRAVENOUS | Status: DC | PRN
  Administered 2019-04-06: 13:00:00 10 mL via INTRA_ARTERIAL

## 2019-04-06 MED ORDER — ACETAMINOPHEN 325 MG PO TABS: 650.0000 mg | ORAL_TABLET | ORAL | Status: DC | PRN

## 2019-04-06 MED ORDER — HEPARIN (PORCINE) IN NACL 1000-0.9 UT/500ML-% IV SOLN
INTRAVENOUS | Status: DC | PRN
  Administered 2019-04-06 (×2): 500 mL

## 2019-04-06 MED ORDER — SODIUM CHLORIDE 0.9 % IV SOLN: INTRAVENOUS | Status: DC

## 2019-04-06 MED ORDER — MIDAZOLAM HCL 2 MG/2ML IJ SOLN
INTRAMUSCULAR | Status: DC | PRN
  Administered 2019-04-06: 1 mg via INTRAVENOUS

## 2019-04-06 MED ORDER — SODIUM CHLORIDE 0.9 % IV SOLN
INTRAVENOUS | Status: DC
  Administered 2019-04-06: 09:00:00 via INTRAVENOUS

## 2019-04-06 MED ORDER — SODIUM CHLORIDE 0.9% FLUSH: 3.0000 mL | Freq: Two times a day (BID) | INTRAVENOUS | Status: DC

## 2019-04-06 SURGICAL SUPPLY — 14 items
CATH BALLN WEDGE 5F 110CM (CATHETERS) ×1 IMPLANT
CATH INFINITI 5FR ANG PIGTAIL (CATHETERS) ×1 IMPLANT
CATH OPTITORQUE TIG 4.0 5F (CATHETERS) ×1 IMPLANT
DEVICE RAD COMP TR BAND LRG (VASCULAR PRODUCTS) ×1 IMPLANT
GLIDESHEATH SLEND SS 6F .021 (SHEATH) ×1 IMPLANT
GUIDEWIRE .025 260CM (WIRE) ×1 IMPLANT
GUIDEWIRE INQWIRE 1.5J.035X260 (WIRE) IMPLANT
INQWIRE 1.5J .035X260CM (WIRE) ×2
KIT HEART LEFT (KITS) ×2 IMPLANT
PACK CARDIAC CATHETERIZATION (CUSTOM PROCEDURE TRAY) ×2 IMPLANT
SHEATH GLIDE SLENDER 4/5FR (SHEATH) ×1 IMPLANT
SHEATH PROBE COVER 6X72 (BAG) ×1 IMPLANT
TRANSDUCER W/STOPCOCK (MISCELLANEOUS) ×2 IMPLANT
TUBING CIL FLEX 10 FLL-RA (TUBING) ×2 IMPLANT

## 2019-04-06 NOTE — Interval H&P Note (Signed)
History and Physical Interval Note:  04/06/2019 12:59 PM  [name]  has presented today for surgery, with the diagnosis of right heart failure, pulmonary hypertension.  The various methods of treatment have been discussed with the patient and family. After consideration of risks, benefits and other options for treatment, the patient has consented to  Procedure(s): RIGHT/LEFT HEART CATH AND CORONARY ANGIOGRAPHY (N/A)  PERCUTANEOUS CORONARY INTERVENTION  as a surgical intervention.  The patient's history has been reviewed, patient examined, no change in status, stable for surgery.  I have reviewed the patient's chart and labs.  Questions were answered to the patient's satisfaction.    Cath Lab Visit (complete for each Cath Lab visit)  Clinical Evaluation Leading to the Procedure:   ACS: No.  Non-ACS:    Anginal Classification: NYHA class III heart failure  Anti-ischemic medical therapy: Maximal Therapy (2 or more classes of medications)  Non-Invasive Test Results: No non-invasive testing performed  Prior CABG: No previous CABG . Glenetta Hew

## 2019-04-06 NOTE — Discharge Instructions (Signed)
Radial Site Care ° °This sheet gives you information about how to care for yourself after your procedure. Your health care provider may also give you more specific instructions. If you have problems or questions, contact your health care provider. °What can I expect after the procedure? °After the procedure, it is common to have: °· Bruising and tenderness at the catheter insertion area. °Follow these instructions at home: °Medicines °· Take over-the-counter and prescription medicines only as told by your health care provider. °Insertion site care °· Follow instructions from your health care provider about how to take care of your insertion site. Make sure you: °? Wash your hands with soap and water before you change your bandage (dressing). If soap and water are not available, use hand sanitizer. °? Change your dressing as told by your health care provider. °? Leave stitches (sutures), skin glue, or adhesive strips in place. These skin closures may need to stay in place for 2 weeks or longer. If adhesive strip edges start to loosen and curl up, you may trim the loose edges. Do not remove adhesive strips completely unless your health care provider tells you to do that. °· Check your insertion site every day for signs of infection. Check for: °? Redness, swelling, or pain. °? Fluid or blood. °? Pus or a bad smell. °? Warmth. °· Do not take baths, swim, or use a hot tub until your health care provider approves. °· You may shower 24-48 hours after the procedure, or as directed by your health care provider. °? Remove the dressing and gently wash the site with plain soap and water. °? Pat the area dry with a clean towel. °? Do not rub the site. That could cause bleeding. °· Do not apply powder or lotion to the site. °Activity ° °· For 24 hours after the procedure, or as directed by your health care provider: °? Do not flex or bend the affected arm. °? Do not push or pull heavy objects with the affected arm. °? Do not  drive yourself home from the hospital or clinic. You may drive 24 hours after the procedure unless your health care provider tells you not to. °? Do not operate machinery or power tools. °· Do not lift anything that is heavier than 10 lb (4.5 kg), or the limit that you are told, until your health care provider says that it is safe. °· Ask your health care provider when it is okay to: °? Return to work or school. °? Resume usual physical activities or sports. °? Resume sexual activity. °General instructions °· If the catheter site starts to bleed, raise your arm and put firm pressure on the site. If the bleeding does not stop, get help right away. This is a medical emergency. °· If you went home on the same day as your procedure, a responsible adult should be with you for the first 24 hours after you arrive home. °· Keep all follow-up visits as told by your health care provider. This is important. °Contact a health care provider if: °· You have a fever. °· You have redness, swelling, or yellow drainage around your insertion site. °Get help right away if: °· You have unusual pain at the radial site. °· The catheter insertion area swells very fast. °· The insertion area is bleeding, and the bleeding does not stop when you hold steady pressure on the area. °· Your arm or hand becomes pale, cool, tingly, or numb. °These symptoms may represent a serious problem   that is an emergency. Do not wait to see if the symptoms will go away. Get medical help right away. Call your local emergency services (911 in the [place] Do not drive yourself to the hospital. °Summary °· After the procedure, it is common to have bruising and tenderness at the site. °· Follow instructions from your health care provider about how to take care of your radial site wound. Check the wound every day for signs of infection. °· Do not lift anything that is heavier than 10 lb (4.5 kg), or the limit that you are told, until your health care provider says  that it is safe. °This information is not intended to replace advice given to you by your health care provider. Make sure you discuss any questions you have with your health care provider. °Document Released: 07/14/2010 Document Revised: 07/17/2017 Document Reviewed: 07/17/2017 °Elsevier Patient Education © 2020 [employer] ° °

## 2019-04-06 NOTE — Research (Signed)
PHDE Informed Consent   Subject Name: [name]  Subject met inclusion and exclusion criteria.  The informed consent form, study requirements and expectations were reviewed with the subject and questions and concerns were addressed prior to the signing of the consent form.  The subject verbalized understanding of the trail requirements.  The subject agreed to participate in the PHDE trial and signed the informed consent.  The informed consent was obtained prior to performance of any protocol-specific procedures for the subject.  A copy of the signed informed consent was given to the subject and a copy was placed in the subject's medical record.  Neva Seat 04/06/2019, 9:16 AM

## 2019-04-07 ENCOUNTER — Encounter (HOSPITAL_COMMUNITY): Payer: Self-pay | Admitting: Cardiology

## 2019-04-08 DIAGNOSIS — E1142 Type 2 diabetes mellitus with diabetic polyneuropathy: Secondary | ICD-10-CM | POA: Diagnosis not present

## 2019-04-08 DIAGNOSIS — G8384 Todd's paralysis (postepileptic): Secondary | ICD-10-CM | POA: Diagnosis not present

## 2019-04-08 DIAGNOSIS — J432 Centrilobular emphysema: Secondary | ICD-10-CM | POA: Diagnosis not present

## 2019-04-08 DIAGNOSIS — I69354 Hemiplegia and hemiparesis following cerebral infarction affecting left non-dominant side: Secondary | ICD-10-CM | POA: Diagnosis not present

## 2019-04-08 DIAGNOSIS — G20 Parkinson's disease: Secondary | ICD-10-CM | POA: Diagnosis not present

## 2019-04-08 DIAGNOSIS — I5032 Chronic diastolic (congestive) heart failure: Secondary | ICD-10-CM | POA: Diagnosis not present

## 2019-04-08 DIAGNOSIS — G40009 Localization-related (focal) (partial) idiopathic epilepsy and epileptic syndromes with seizures of localized onset, not intractable, without status epilepticus: Secondary | ICD-10-CM | POA: Diagnosis not present

## 2019-04-08 DIAGNOSIS — I130 Hypertensive heart and chronic kidney disease with heart failure and stage 1 through stage 4 chronic kidney disease, or unspecified chronic kidney disease: Secondary | ICD-10-CM | POA: Diagnosis not present

## 2019-04-08 DIAGNOSIS — I0981 Rheumatic heart failure: Secondary | ICD-10-CM | POA: Diagnosis not present

## 2019-04-08 NOTE — Progress Notes (Signed)
Particularly impressive but suspect she has RV > LV failure with PCWP near normal (mildly elevated LVEDP) and RVEDP about 12 (though RA pressure looks like only around 10).  Suspect with increased activity/stress her numbers may rise.  Suspect she is going to be hard to manage, with dilated/dysfunctional RV would make sure OSA is treated properly and would make sure she is not hypoxemic during the day ([facility]).  Her PA pressures were not markedly elevated. I'm happy to see her if you would like.

## 2019-04-10 DIAGNOSIS — J432 Centrilobular emphysema: Secondary | ICD-10-CM | POA: Diagnosis not present

## 2019-04-10 DIAGNOSIS — I0981 Rheumatic heart failure: Secondary | ICD-10-CM | POA: Diagnosis not present

## 2019-04-10 DIAGNOSIS — I5032 Chronic diastolic (congestive) heart failure: Secondary | ICD-10-CM | POA: Diagnosis not present

## 2019-04-10 DIAGNOSIS — G8384 Todd's paralysis (postepileptic): Secondary | ICD-10-CM | POA: Diagnosis not present

## 2019-04-10 DIAGNOSIS — E1142 Type 2 diabetes mellitus with diabetic polyneuropathy: Secondary | ICD-10-CM | POA: Diagnosis not present

## 2019-04-10 DIAGNOSIS — G20 Parkinson's disease: Secondary | ICD-10-CM | POA: Diagnosis not present

## 2019-04-10 DIAGNOSIS — G40009 Localization-related (focal) (partial) idiopathic epilepsy and epileptic syndromes with seizures of localized onset, not intractable, without status epilepticus: Secondary | ICD-10-CM | POA: Diagnosis not present

## 2019-04-10 DIAGNOSIS — I69354 Hemiplegia and hemiparesis following cerebral infarction affecting left non-dominant side: Secondary | ICD-10-CM | POA: Diagnosis not present

## 2019-04-10 DIAGNOSIS — I130 Hypertensive heart and chronic kidney disease with heart failure and stage 1 through stage 4 chronic kidney disease, or unspecified chronic kidney disease: Secondary | ICD-10-CM | POA: Diagnosis not present

## 2019-04-13 ENCOUNTER — Telehealth: Payer: Self-pay | Admitting: Adult Health

## 2019-04-13 NOTE — Telephone Encounter (Signed)
Called patient, advised of dose of medication-   Patient has questions about her CATH- she states she does not remember what Dr.Harding told her, I advised I would route to MD to advise of recent CATH and I would give her a call back.  Patient verbalized understanding.

## 2019-04-13 NOTE — Telephone Encounter (Signed)
New Message:    Please call, pt needs to know how is she supposed to be taking her Hydralazine please. Pt says need somebody to explain her Craterization  Results to her please.

## 2019-04-13 NOTE — Telephone Encounter (Signed)
Called patient, and advised patient to call back to discuss CATH from MD.  Left call back number.

## 2019-04-13 NOTE — Telephone Encounter (Signed)
Her cath results were reassuring -- normal coronaries.  No severe pulmonary hypertension.  We can continue with 100 mg BID Hydralazine.   Plan is to be more judicious with taking additional Lasix for swelling / weight gain --> increase dose x 1-2 days, then go back to 40 mg for 1-2 days -- if weight still up & edema still bad - do 2 more days of increased dose (80 mg Am - 40 mg PM).    We just have to allow time for fluid levels to equilibrate.  Glenetta Hew, MD

## 2019-04-14 NOTE — Telephone Encounter (Signed)
My plan was to simplify it and have her take 100 mg of hydralazine twice daily. That way we can change to either her taking to 50 mg tabs twice daily or simply give her the prescription to 100 mg tablets and take 100 mg tablets twice daily.  Glenetta Hew, MD

## 2019-04-14 NOTE — Telephone Encounter (Signed)
Called patient, advised of note below.  Patient has question regarding hydralazine- med list states to take 75 mg (1.5 tablets) twice daily, and that's how she has been taking it. Should she take the 50 twice a day or continue with what she is taking?  Thanks!

## 2019-04-14 NOTE — Telephone Encounter (Signed)
Patient returned call

## 2019-04-15 DIAGNOSIS — Z794 Long term (current) use of insulin: Secondary | ICD-10-CM | POA: Diagnosis not present

## 2019-04-15 DIAGNOSIS — Z961 Presence of intraocular lens: Secondary | ICD-10-CM | POA: Diagnosis not present

## 2019-04-15 DIAGNOSIS — E119 Type 2 diabetes mellitus without complications: Secondary | ICD-10-CM | POA: Diagnosis not present

## 2019-04-15 DIAGNOSIS — H26493 Other secondary cataract, bilateral: Secondary | ICD-10-CM | POA: Diagnosis not present

## 2019-04-15 LAB — HM DIABETES EYE EXAM

## 2019-04-15 MED ORDER — HYDRALAZINE HCL 100 MG PO TABS: 100.0000 mg | ORAL_TABLET | Freq: Two times a day (BID) | ORAL | 3 refills | Status: DC

## 2019-04-15 NOTE — Telephone Encounter (Signed)
Patient called with MD instructions concerning hydralazine. Rx(s) sent to pharmacy electronically.  She would like to know if she needs a sooner cath f/up than the 3 month visit recommended by K. Lawrence NP at her 10/6 appointment

## 2019-04-16 NOTE — Telephone Encounter (Signed)
ER we should probably have her come in sometime the next month or so to talk about how she is doing and medication adjustments  [name] Hew, MD

## 2019-04-17 DIAGNOSIS — G4733 Obstructive sleep apnea (adult) (pediatric): Secondary | ICD-10-CM | POA: Diagnosis not present

## 2019-04-19 NOTE — Progress Notes (Signed)
Subjective:     Patient ID: [name] , female    DOB: 1945/09/13 , 73 y.o.   MRN: [id number]   Chief Complaint  Patient presents with  . Loss of Consciousness    HPI  She is here today for hospital f/u. She was admitted to [facility] hospital on 9/1 for further evaluation of LOC episode.  Patient reports she was folding clothes the day of admission, when shesuddenly experienced a funny feeling and then slumped over. She awakened to people yelling her name telling her to get up. EMS had arrived at her home by then.  Her sister reported that patient was unresponsive for at least 6 minutes and had some slurring of speech and confusion after she regained consciousness. She was brought to ER for further evaluation, initial workup was unremarkable. It is unclear if this was due an arrhythmia vs. Seizure. Her hospital course was significant for full cardiac evaluation. MRI and EEG also performed, without any new findings. Additionally, the dose of her Lamictal was increased.  She has tolerated dose change without any issues. She was discharged on 9/5 in stable condition. She has not had any further episodes since being at home.   Loss of Consciousness This is a new problem. The current episode started 1 to 4 weeks ago. The problem has been gradually improving. She lost consciousness for a period of greater than 5 minutes. The symptoms are aggravated by normal activity. Her past medical history is significant for seizures.     Past Medical History:  Diagnosis Date  . Abnormal liver function     in the past.  . Anemia   . Arthritis    all over  . Bruises easily   . Cataract    right eye;immature  . Chronic back pain    stenosis  . Chronic cough   . Chronic kidney disease   . COPD (chronic obstructive pulmonary disease) ([place])   . Demand myocardial infarction [facility]) 2012   Demand Infarction in setting of Pancreatitis --> mild Troponin elevation, NON-OBSTRUCTIVE CAD  . Depression    takes  Abilify daily as well as Zoloft  . Diastolic heart failure 0349   Grade 1 diastolic Dysfunction by Echo   . Diverticulosis   . DM (diabetes mellitus) ([place])    takes Victoza daily as well as Lantus and Humalog  . Empty sella ([place])    on MRI in 2009.  . Glaucoma   . Headache(784.0)    last migraine-4-29yr ago  . History of blood transfusion    no abnormal reaction noted  . History of colon polyps    benign  . HTN (hypertension)    takes B[employer] Energydaily  . Hyperlipidemia    takes Lipitor daily  . Joint swelling   . Nocturia   . Obesity hypoventilation syndrome ([place]   . Obstructive sleep apnea 02/2018   Notably improved split-night study with weight loss from 270 (2017) down to 250 pounds (2019).  . Pancreatitis    takes Pancrelipase daily  . Parkinson's disease ([place]    takes Sinemet daily  . Peripheral neuropathy   . Pneumonia 2012  . [place]    takes Lamictal daily and Primidone nightly;last seizure 2wks ago  . Urinary urgency    With increased frequency  . Varicose veins of both lower extremities with pain    With edema.  Takes daily Lasix     Family History  Problem Relation Age of Onset  .  Allergies Father   . Heart disease Father        before 32  . Heart failure Father   . Hypertension Father   . Hyperlipidemia Father   . Heart disease Mother   . Diabetes Mother   . Hypertension Mother   . Hyperlipidemia Mother   . Hypertension Sister   . Heart disease Sister        before 21  . Hyperlipidemia Sister   . Diabetes Brother   . Hypertension Brother   . Diabetes Sister   . Hypertension Sister   . Diabetes Son   . Hypertension Son   . Cancer Other      Current Outpatient Medications:  .  Accu-Chek Softclix Lancets lancets, Use as instructed to check blood sugars 2 times per day dx: e11.22, Disp: 300 each, Rfl: 3 .  acetaminophen (TYLENOL) 500 MG tablet, Take 500-1,000 mg by mouth every 4 (four) hours as needed for moderate  pain. , Disp: , Rfl:  .  albuterol (PROVENTIL HFA;VENTOLIN HFA) 108 (90 Base) MCG/ACT inhaler, Inhale 1-2 puffs into the lungs every 6 (six) hours as needed for wheezing or shortness of breath (DOE WHEEZING SOB). (Patient taking differently: Inhale 1-2 puffs into the lungs every 6 (six) hours as needed for wheezing or shortness of breath. ), Disp: 1 Inhaler, Rfl: 1 .  B Complex-C (B-COMPLEX WITH VITAMIN C) tablet, Take 1 tablet by mouth daily., Disp: , Rfl:  .  Blood Glucose Calibration (ACCU-CHEK AVIVA) SOLN, Use as directed dx: e11.22, Disp: 1 each, Rfl: 3 .  Blood Glucose Monitoring Suppl (ACCU-CHEK AVIVA PLUS) w/Device KIT, Use as directed to check blood sugars 2 times per day dx: e11.22, Disp: 1 kit, Rfl: 3 .  carvedilol (COREG) 25 MG tablet, Take 1 tablet (25 mg total) by mouth 2 (two) times daily with a meal., Disp: 180 tablet, Rfl: 3 .  Cholecalciferol (VITAMIN D-3) 1000 units CAPS, Take 1,000 Units by mouth daily., Disp: , Rfl:  .  diclofenac sodium (VOLTAREN) 1 % GEL, Apply 2 g topically 4 (four) times daily as needed (pain)., Disp: , Rfl:  .  DROPLET PEN NEEDLES 31G X 8 MM MISC, USE DAILY WITH VICTOZA AND/OR NOVOLOG. , Disp: 400 each, Rfl: 1 .  furosemide (LASIX) 40 MG tablet, Take one tablet ('40mg'$ ) by mouth on Saturday and Sunday only. (Patient taking differently: Take 40 mg by mouth See admin instructions. Take one tablet ('40mg'$ ) by mouth on Saturday and Sunday only.), Disp: 180 tablet, Rfl: 3 .  furosemide (LASIX) 80 MG tablet, Take one tablet ('80mg'$ ) by mouth Monday through Friday only. (Patient taking differently: Take 80 mg by mouth See admin instructions. Take one tablet ('80mg'$ ) by mouth Monday through Friday only.), Disp: 90 tablet, Rfl: 3 .  glucose blood (ACCU-CHEK AVIVA PLUS) test strip, Use as instructed to check blood sugars 2 times per day dx: e11.22, Disp: 300 each, Rfl: 3 .  Insulin Glargine, 1 Unit Dial, (TOUJEO SOLOSTAR) 300 UNIT/ML SOPN, Inject 36 Units into the skin at  bedtime. (Patient taking differently: Inject 34 Units into the skin at bedtime. ), Disp: 15 pen, Rfl: 2 .  Isopropyl Alcohol (ALCOHOL WIPES) 70 % MISC, Apply 1 each topically 2 (two) times daily., Disp: 300 each, Rfl: 3 .  isosorbide mononitrate (IMDUR) 60 MG 24 hr tablet, Take 1 tablet (60 mg total) by mouth daily., Disp: 90 tablet, Rfl: 3 .  Multiple Vitamins-Minerals (MULTIVITAMIN WITH MINERALS) tablet, Take 1 tablet by mouth daily.,  Disp: , Rfl:  .  Pancrelipase, Lip-Prot-Amyl, (ZENPEP) 25000-79000 units CPEP, Take 1-2 capsules by mouth See admin instructions. Take 2 capsules in the morning, 1 capsule with lunch, and 2 capsules in the evening., Disp: , Rfl:  .  potassium chloride SA (K-DUR) 20 MEQ tablet, TAKE 2 TABLETS ONCE DAILY, BUT ON THE DAY YOU  TAKE METOLAZONE TAKE AN EXTRA TABLET FOR A TOTAL OF 3 TABLETS (Patient taking differently: Take 40-60 mEq by mouth See admin instructions. Take 40 meq daily, when taking metolazone take a total of 60 meq daily), Disp: 204 tablet, Rfl: 0 .  Semaglutide, 1 MG/DOSE, (OZEMPIC, 1 MG/DOSE,) 2 MG/1.5ML SOPN, Inject 1 mg into the skin once a week. (Patient taking differently: Inject 1 mg into the skin every Thursday. ), Disp: 3 pen, Rfl: 1 .  sertraline (ZOLOFT) 50 MG tablet, TAKE 1 TABLET EVERY DAY (Patient taking differently: Take 50 mg by mouth See admin instructions. Take with 7m to equal 75 mg at bedtime), Disp: 90 tablet, Rfl: 2 .  aspirin-sod bicarb-citric acid (ALKA-SELTZER) 325 MG TBEF tablet, Take 650 mg by mouth every 6 (six) hours as needed (indigestion)., Disp: , Rfl:  .  atorvastatin (LIPITOR) 10 MG tablet, TAKE 1 TABLET EVERY DAY (Patient taking differently: Take 10 mg by mouth every evening. ), Disp: 90 tablet, Rfl: 1 .  hydrALAZINE (APRESOLINE) 100 MG tablet, Take 1 tablet (100 mg total) by mouth 2 (two) times daily., Disp: 180 tablet, Rfl: 3 .  hydrocortisone cream 1 %, Apply 1 application topically daily as needed for itching., Disp: , Rfl:   .  insulin lispro (HUMALOG) 100 UNIT/ML injection, Inject 2-4 Units into the skin 3 (three) times daily as needed (high blood sugar over 150)., Disp: , Rfl:  .  lamoTRIgine (LAMICTAL) 100 MG tablet, Take 1 tablet twice a day (Take with 247mtwice a day to make a total of 12580mwice a day) (Patient taking differently: Take 100 mg by mouth See admin instructions. Take with 25 mg tablet to equal 125 mg twice daily), Disp: 180 tablet, Rfl: 3 .  lamoTRIgine (LAMICTAL) 25 MG tablet, Take 1 tablet twice a day (Take with 100m60mblet twice a day, to make it 125mg36mce a day) (Patient taking differently: Take 25 mg by mouth See admin instructions. Take with 100 mg tablet to equal 125 mg twice daily), Disp: 180 tablet, Rfl: 3 .  Menthol, Topical Analgesic, (STOPAIN ROLL-ON EX), Apply 1 application topically daily as needed (pain)., Disp: , Rfl:  .  metolazone (ZAROXOLYN) 2.5 MG tablet, Take 2.5 mg by mouth See admin instructions. Take 2.5 mg by mouth Monday through Friday as needed if weight is greater or equal to 269 lbs, Disp: , Rfl:  .  neomycin-bacitracin-polymyxin (NEOSPORIN) ointment, Apply 1 application topically as needed for wound care., Disp: , Rfl:  .  sertraline (ZOLOFT) 25 MG tablet, Take 25 mg by mouth See admin instructions. Take with 50 mg to equal 75 mg at bedtime, Disp: , Rfl:  .  Tolnaftate (TINACTIN EX), Apply 1 application topically daily as needed (foot fungus)., Disp: , Rfl:    Allergies  Allergen Reactions  . Other Anaphylaxis and Rash    Bleach  . Penicillins Hives    Did it involve swelling of the face/tongue/throat, SOB, or low BP? No Did it involve sudden or severe rash/hives, skin peeling, or any reaction on the inside of your mouth or nose? Yes Did you need to seek medical attention at a  hospital or doctor's office? Yes When did it last happen?Childhood allergy  If all above answers are "NO", may proceed with cephalosporin use.    . Sulfa Antibiotics Hives  .  Aspirin Other (See Comments)     On aspirin 81 mg - Rectal bleeding in dec 2018  . Codeine     Headache and makes the patient feel "off"     Review of Systems  Constitutional: Negative.   Respiratory: Negative.   Cardiovascular: Positive for syncope.  Gastrointestinal: Negative.   Musculoskeletal: Positive for arthralgias.  Psychiatric/Behavioral: Negative.      Today's Vitals   03/05/19 1416  BP: 120/60  Pulse: 60  Temp: 98.6 F (37 C)  TempSrc: Oral  SpO2: 95%  Weight: 224 lb 3.2 oz (101.7 kg)  Height: 4' 11.4" (1.509 m)   Body mass index is 44.68 kg/m.   Objective:  Physical Exam Vitals signs and nursing note reviewed.  Constitutional:      Appearance: Normal appearance. She is obese.  HENT:     Head: Normocephalic and atraumatic.  Cardiovascular:     Rate and Rhythm: Normal rate and regular rhythm.     Heart sounds: Normal heart sounds.  Pulmonary:     Effort: Pulmonary effort is normal.     Breath sounds: Normal breath sounds.  Skin:    General: Skin is warm.  Neurological:     General: No focal deficit present.     Mental Status: She is alert.  Psychiatric:        Mood and Affect: Mood normal.        Behavior: Behavior normal.         Assessment And Plan:     1. Syncope, unspecified syncope type  Discharge summary reviewed in full detail.  Her sister was also present for her visit, all questions were answered to their satisfaction. She is encouraged to stay well hydrated and to take meds as prescribed.   2. Arthralgia, unspecified joint  I will check an arthritis panel today if not done recently. She is also encouraged to follow an anti-inflammatory diet.   3. Recurrent depression (HCC)  Chronic. I will increase her Zoloft to 1-1/2 tabs daily. She will rto in six weeks for re-evaluation.   4. Class 3 severe obesity due to excess calories with serious comorbidity and body mass index (BMI) of 40.0 to 44.9 in adult [facility])  She is encouraged to  perform chair exercises at least five days per week.  I have encouraged her to strive to lose ten pounds by the end of the year.   5. Seizure disorder ([place])  Chronic, has f/u with Neurology. Encouraged to take meds as prescribed.   [name], MD    THE PATIENT IS ENCOURAGED TO PRACTICE SOCIAL DISTANCING DUE TO THE COVID-19 PANDEMIC.

## 2019-04-20 NOTE — Telephone Encounter (Signed)
Patient scheduled for cath f/u 05/04/19 with MD

## 2019-04-21 ENCOUNTER — Telehealth: Payer: Self-pay

## 2019-04-21 DIAGNOSIS — H26492 Other secondary cataract, left eye: Secondary | ICD-10-CM | POA: Diagnosis not present

## 2019-04-22 ENCOUNTER — Encounter: Payer: Self-pay | Admitting: Internal Medicine

## 2019-04-23 ENCOUNTER — Ambulatory Visit: Payer: Self-pay | Admitting: Pharmacist

## 2019-04-23 DIAGNOSIS — Z794 Long term (current) use of insulin: Secondary | ICD-10-CM

## 2019-04-23 DIAGNOSIS — E1122 Type 2 diabetes mellitus with diabetic chronic kidney disease: Secondary | ICD-10-CM

## 2019-04-23 DIAGNOSIS — I5032 Chronic diastolic (congestive) heart failure: Secondary | ICD-10-CM

## 2019-04-23 NOTE — Progress Notes (Signed)
  Chronic Care Management   Outreach Note  04/23/2019 Name: [name] MRN: [id number] DOB: 05-16-1946  Referred by: [name], MD Reason for referral : Chronic Care Management   An unsuccessful telephone outreach was attempted today. The patient was referred to the case management team by for assistance with care management and care coordination.   Follow Up Plan: The care management team will reach out to the patient again over the next 7-10 business days.   SIGNATURE [name], PharmD, BCPS Clinical Pharmacist, [facility] [place]: [phone]

## 2019-05-04 ENCOUNTER — Ambulatory Visit (INDEPENDENT_AMBULATORY_CARE_PROVIDER_SITE_OTHER): Payer: Medicare HMO | Admitting: Cardiology

## 2019-05-04 ENCOUNTER — Other Ambulatory Visit: Payer: Self-pay

## 2019-05-04 ENCOUNTER — Encounter: Payer: Self-pay | Admitting: Cardiology

## 2019-05-04 ENCOUNTER — Telehealth: Payer: Self-pay

## 2019-05-04 VITALS — BP 136/64 | HR 65 | Ht 61.0 in | Wt 223.8 lb

## 2019-05-04 DIAGNOSIS — Z79899 Other long term (current) drug therapy: Secondary | ICD-10-CM

## 2019-05-04 DIAGNOSIS — I5032 Chronic diastolic (congestive) heart failure: Secondary | ICD-10-CM

## 2019-05-04 DIAGNOSIS — G4733 Obstructive sleep apnea (adult) (pediatric): Secondary | ICD-10-CM | POA: Diagnosis not present

## 2019-05-04 DIAGNOSIS — I272 Pulmonary hypertension, unspecified: Secondary | ICD-10-CM

## 2019-05-04 DIAGNOSIS — I110 Hypertensive heart disease with heart failure: Secondary | ICD-10-CM

## 2019-05-04 DIAGNOSIS — J449 Chronic obstructive pulmonary disease, unspecified: Secondary | ICD-10-CM | POA: Diagnosis not present

## 2019-05-04 DIAGNOSIS — E662 Morbid (severe) obesity with alveolar hypoventilation: Secondary | ICD-10-CM

## 2019-05-04 DIAGNOSIS — Z6841 Body Mass Index (BMI) 40.0 and over, adult: Secondary | ICD-10-CM

## 2019-05-04 DIAGNOSIS — I25118 Atherosclerotic heart disease of native coronary artery with other forms of angina pectoris: Secondary | ICD-10-CM | POA: Diagnosis not present

## 2019-05-04 NOTE — Progress Notes (Signed)
Primary Care Provider: [name], MD Cardiologist: Bryan Lemma, MD Electrophysiologist:   Clinic Note: Chief Complaint  Patient presents with  . Hospitalization Follow-up    Post cath results reviewed  . Shortness of Breath    Improved     HPI:    [name] is a 73 y.o. female with a history of HFpEF (class II-III symptoms, concern for pulmonary hypertension), obesity with OSA on CPAP seizure disorder, PACs and prior CVA below who presents today for hospital-cath follow-up  [name] was last seen on October 6 by Joni Reining, NP.  This was a post hospital follow-up after being seen for possible syncope. --Plan was event monitor, echocardiogram.  She also noted worsening dyspnea but no edema or lightheadedness. -->  Echo showed severely reduced RV function with severe Enlargement. -->  Treatment was additional Lasix, responded from anemia standpoint, but noted worsening dizziness/near syncope --> Recommended R&L HC. --> Carvedilol & Imdur continued.  Hydralazine dose reduced to 75 mg twice daily.  Continue Lasix 80 mg daily and 40 mg on Saturdays and Sundays..  Recent Hospitalizations: Right & Left Heart Cath April 06, 2019  Reviewed  CV studies:    The following studies were reviewed today: (if available, images/films reviewed: From Epic Chart or Care Everywhere) . 2D echo 02/25/2019: Normal LV size and function.  EF 50 to 55%.  Impaired relaxation.  RV appears to have moderately reduced function.  Severely enlarged.  Cannot fully assess RV pressures.  Hypermobile interatrial septum.  Right atrium severely dilated. . R&LHC April 06, 2019: Angiographically normal coronary arteries.  Moderately elevated LVEDP.  Mild pulmonary hypertension o Recommended shorter courses of increased diuresis for edema or dyspnea. o RVP-EDP 54/4 mmHg-12 millimeter mercury, RAP 9-11 mmHg. o PAP 56/14 mmHg-mean 31 mmHg. PCWP: 11-15 mmHg Elevated LV EDP consistent with volume  overload. LV P-EDP: 138/9 mmHg - 18 mmHg Ao P- MAP 144/70 mmHg - 100 mmHg PA sat 75%, ao sat 98%. CARDIAC OUTPUT-INDEX (FICK): 7.35-3.73   Interval History:   [name] returns today saying that her breathing has actually improved.  She has no pain close attention to her weight and the weight here today is actually pretty much right in the middle of her average range between 222- 224 pounds.  She really has not had maybe more than one recording above 3 pounds from her dry weight and has had a few just below.  She has adjusted her Lasix accordingly.  She is doing more walking.  She is now able to do 5 loops around her house now without slowing down which was much more than she had been healthy before.  She still sleeps on 3 pillows but says that this is as much for back pain is anything else.  She uses CPAP most of the time.  She indicates that her balance is quite off and she has had some significant dizziness.  Does not seem to be related to blood pressure or heart rate.  Scribes her dizziness was more related to poor balance, unsteady gait. Despite having significant exertional dyspnea, she is not noticing any chest tightness or pressure.  Pretty well controlled edema but nothing dramatic.  Despite having dyspnea, she is not having any chest tightness or pressure with rest or exertion.  Energy level is still down, but doing better.  Swelling is stable.   CV Review of Symptoms (Summary)  positive for - dyspnea on exertion, edema and Both seem to be pretty  much improved and stable negative for - irregular heartbeat, orthopnea, palpitations, paroxysmal nocturnal dyspnea, rapid heart rate or shortness of breath  The patient does not have symptoms concerning for COVID-19 infection (fever, chills, cough, or new shortness of breath).  The patient is practicing social distancing. ++ Masking.  Minimizes how many times she goes out for groceries/shopping.    REVIEWED OF SYSTEMS   A  comprehensive ROS was performed. Review of Systems  Constitutional: Negative for chills, fever and malaise/fatigue (Gradually getting her energy back; still has exercise intolerance.).  HENT: Negative for congestion and nosebleeds.   Respiratory: Positive for shortness of breath (Improved). Negative for sputum production and wheezing.   Cardiovascular: Positive for leg swelling (Well-controlled).  Gastrointestinal: Negative for diarrhea and heartburn.  Musculoskeletal: Positive for joint pain. Negative for falls.  Psychiatric/Behavioral: Negative for depression and memory loss. The patient is not nervous/anxious and does not have insomnia.   All other systems reviewed and are negative.  I have reviewed and (if needed) personally updated the patient's problem list, medications, allergies, past medical and surgical history, social and family history.   PAST MEDICAL HISTORY   Past Medical History:  Diagnosis Date  . Abnormal liver function     in the past.  . Anemia   . Arthritis    all over  . Bruises easily   . Cataract    right eye;immature  . Chronic back pain    stenosis  . Chronic cough   . Chronic kidney disease   . COPD (chronic obstructive pulmonary disease) (HCC)   . Demand myocardial infarction [facility]) 2012   Demand Infarction in setting of Pancreatitis --> mild Troponin elevation, NON-OBSTRUCTIVE CAD  . Depression    takes Abilify daily as well as Zoloft  . Diastolic heart failure 2010   Grade 1 diastolic Dysfunction by Echo   . Diverticulosis   . DM (diabetes mellitus) (HCC)    takes Victoza daily as well as Lantus and Humalog  . Empty sella (HCC)    on MRI in 2009.  . Glaucoma   . Headache(784.0)    last migraine-4-79yrs ago  . History of blood transfusion    no abnormal reaction noted  . History of colon polyps    benign  . HTN (hypertension)    takes [employer] daily  . Hyperlipidemia    takes Lipitor daily  . Joint swelling   . Nocturia    . Obesity hypoventilation syndrome (HCC)   . Obstructive sleep apnea 02/2018   Notably improved split-night study with weight loss from 270 (2017) down to 250 pounds (2019).  . Pancreatitis    takes Pancrelipase daily  . Parkinson's disease (HCC)    takes Sinemet daily  . Peripheral neuropathy   . Pneumonia 2012  . Seizures (HCC)    takes Lamictal daily and Primidone nightly;last seizure 2wks ago  . Urinary urgency    With increased frequency  . Varicose veins of both lower extremities with pain    With edema.  Takes daily Lasix     PAST SURGICAL HISTORY   Past Surgical History:  Procedure Laterality Date  . ABDOMINAL HYSTERECTOMY    . APPENDECTOMY    . BACK SURGERY    . Cardiac Event Monitor  September-October 2017   Sinus rhythm with occasional PACs and artifact. No arrhythmias besides one short run of tachycardia.  . CHOLECYSTECTOMY    . COLONOSCOPY N/A 08/15/2012   Procedure: COLONOSCOPY;  Surgeon: Willis Modena, MD;  Location: [facility] ENDOSCOPY;  Service: Endoscopy;  Laterality: N/A;  . COLONOSCOPY WITH PROPOFOL Left 06/17/2017   Procedure: COLONOSCOPY WITH PROPOFOL;  Surgeon: Kerin Salen, MD;  Location: [facility] ENDOSCOPY;  Service: Gastroenterology;  Laterality: Left;  . CORONARY CALCIUM SCORE AND CTA  04/2018   Coronary calcium score 71.9.  Very large, hyperdynamic LAD wrapping apex giving rise to PDA.  Moderate LAD-diagonal and circumflex plaque -> CT FFR suggested positive findings in distal D1, D2 and distal circumflex.  Referred for cath. ==> FALSE POSITIVE  . ESOPHAGOGASTRODUODENOSCOPY    . eye cysts Bilateral   . LASIK    . LEFT HEART CATH AND CORONARY ANGIOGRAPHY N/A 05/16/2018   Procedure: LEFT HEART CATH AND CORONARY ANGIOGRAPHY;  Surgeon: Lyn Records, MD;  Location: [facility] INVASIVE CV LAB:  Angiographically normal coronary arteries with LVEDP of 21 mmHg.  Large draping hyperdominant LAD that wraps the apex and provides distal half of the PDA.  Relatively small caliber  distal Cx -->proxLPDA, non-dom RCA. No Cx or Diag lesions -- FALSE + CT FFR  . LEFT HEART CATH AND CORONARY ANGIOGRAPHY  2009/March 2012   2009: (Dr. Janene Harvey) Nonobstructive CAD; 2012: Minimal CAD --> false positive stress test  . NM MYOVIEW LTD  01/2016; 01/2018   a)LOW RISK. No ischemia or infarction;; b) EF 55-60%. NO ST changes.  No ischemia or Infarction. NO significant RV enlargement.  LOW RISK.  Marland Kitchen Polysomnogram  02/2018   (Dr. Vickey Huger from neurology): Split study was not adequately done due to low AHI.  She slept reclined and had an AHI less than 10, prolonged hypoxemia and no hypercapnia. -->  She has home oxygen already prescribed, but did not meet criteria for nightly oxygen.  (Was noted that the patient did not cooperate well with study.  Titration was discussed.  Marland Kitchen RECTAL POLYPECTOMY    . RIGHT/LEFT HEART CATH AND CORONARY ANGIOGRAPHY N/A 04/06/2019   Procedure: RIGHT/LEFT HEART CATH AND CORONARY ANGIOGRAPHY;  Surgeon: Marykay Lex, MD;  Location: [facility] INVASIVE CV LAB; angiographically normal coronary arteries.  Moderately elevated AD LVEDP..  Mild pulmonary hypertension: PA P 56/14 mmHg-mean 31 mmHg.  RAP 10 mmHg.  RV P-EDP 54/4 mmHg - 12 mmHg.  PCWP 11-15 mmHg.  CO-CI: 7.35-3.73.  . TONSILLECTOMY    . TRANSTHORACIC ECHOCARDIOGRAM  01/2016; 05/2017   A) Normal LV chamber size with mod LVH pattern. EF 50-55%. Severe LA dilation. Mod RA dilation. PAP elevated at 38 mmHg;; B) Mild concentric LVH.  EF 55-60% & no RWMA.  Grade 2 DD.  Diastolic flattening of the ventricular septum == ? elevated PAP.  Mild aortic valve calcification/sclerosis.  Mod LA dilation.  Mild RV dilation & Mod RA dilation  . TRANSTHORACIC ECHOCARDIOGRAM  10/2017; 01/2018   a) EF 55-60%.  No RWMA,  Moderate LA dilation.  Mild LA dilation.  Peak PA pressures ~57 mmHg (moderate);;; b) EF 55-60%.  GRII DD.  Suggestion of RV volume overload with moderate RV dilation.  Severe LA and RA dilation.Marland Kitchen  PA pressure ~67%.  .  TRANSTHORACIC ECHOCARDIOGRAM  01/2019   Normal LV size and function.  EF 50 to 55%.  Impaired relaxation.  RV appears to have moderately reduced function.  Severely enlarged.  Cannot fully assess RV pressures.  Hypermobile interatrial septum.  Right atrium severely dilated.  . TUBAL LIGATION    . vaginal cyst removed     several times     MEDICATIONS/ALLERGIES   Current Meds  Medication Sig  . Accu-Chek  Softclix Lancets lancets Use as instructed to check blood sugars 2 times per day dx: e11.22  . acetaminophen (TYLENOL) 500 MG tablet Take 500-1,000 mg by mouth every 4 (four) hours as needed for moderate pain.   Marland Kitchen albuterol (PROVENTIL HFA;VENTOLIN HFA) 108 (90 Base) MCG/ACT inhaler Inhale 1-2 puffs into the lungs every 6 (six) hours as needed for wheezing or shortness of breath (DOE WHEEZING SOB). (Patient taking differently: Inhale 1-2 puffs into the lungs every 6 (six) hours as needed for wheezing or shortness of breath. )  . atorvastatin (LIPITOR) 10 MG tablet TAKE 1 TABLET EVERY DAY (Patient taking differently: Take 10 mg by mouth every evening. )  . B Complex-C (B-COMPLEX WITH VITAMIN C) tablet Take 1 tablet by mouth daily.  . Blood Glucose Calibration (ACCU-CHEK AVIVA) SOLN Use as directed dx: e11.22  . Blood Glucose Monitoring Suppl (ACCU-CHEK AVIVA PLUS) w/Device KIT Use as directed to check blood sugars 2 times per day dx: e11.22  . carvedilol (COREG) 25 MG tablet Take 1 tablet (25 mg total) by mouth 2 (two) times daily with a meal.  . Cholecalciferol (VITAMIN D-3) 1000 units CAPS Take 1,000 Units by mouth daily.  . diclofenac sodium (VOLTAREN) 1 % GEL Apply 2 g topically 4 (four) times daily as needed (pain).  . DROPLET PEN NEEDLES 31G X 8 MM MISC USE DAILY WITH VICTOZA AND/OR NOVOLOG.   . furosemide (LASIX) 40 MG tablet Take one tablet (40mg ) by mouth on Saturday and Sunday only. (Patient taking differently: Take 40 mg by mouth See admin instructions. Take one tablet (40mg ) by mouth  on Saturday and Sunday only.)  . furosemide (LASIX) 80 MG tablet Take one tablet (80mg ) by mouth Monday through Friday only. (Patient taking differently: Take 80 mg by mouth See admin instructions. Take one tablet (80mg ) by mouth Monday through Friday only.)  . glucose blood (ACCU-CHEK AVIVA PLUS) test strip Use as instructed to check blood sugars 2 times per day dx: e11.22  . hydrALAZINE (APRESOLINE) 100 MG tablet Take 1 tablet (100 mg total) by mouth 2 (two) times daily.  . hydrocortisone cream 1 % Apply 1 application topically daily as needed for itching.  . Insulin Glargine, 1 Unit Dial, (TOUJEO SOLOSTAR) 300 UNIT/ML SOPN Inject 36 Units into the skin at bedtime. (Patient taking differently: Inject 34 Units into the skin at bedtime. )  . insulin lispro (HUMALOG) 100 UNIT/ML injection Inject 2-4 Units into the skin 3 (three) times daily as needed (high blood sugar over 150).  . Isopropyl Alcohol (ALCOHOL WIPES) 70 % MISC Apply 1 each topically 2 (two) times daily.  . isosorbide mononitrate (IMDUR) 60 MG 24 hr tablet Take 1 tablet (60 mg total) by mouth daily.  Marland Kitchen lamoTRIgine (LAMICTAL) 100 MG tablet Take 1 tablet twice a day (Take with 25mg  twice a day to make a total of 125mg  twice a day) (Patient taking differently: Take 100 mg by mouth See admin instructions. Take with 25 mg tablet to equal 125 mg twice daily)  . lamoTRIgine (LAMICTAL) 25 MG tablet Take 1 tablet twice a day (Take with 100mg  tablet twice a day, to make it 125mg  twice a day) (Patient taking differently: Take 25 mg by mouth See admin instructions. Take with 100 mg tablet to equal 125 mg twice daily)  . Menthol, Topical Analgesic, (STOPAIN ROLL-ON EX) Apply 1 application topically daily as needed (pain).  Marland Kitchen metolazone (ZAROXOLYN) 2.5 MG tablet Take 2.5 mg by mouth See admin instructions. Take 2.5 mg  by mouth Monday through Friday as needed if weight is greater or equal to 269 lbs  . Multiple Vitamins-Minerals (MULTIVITAMIN WITH  MINERALS) tablet Take 1 tablet by mouth daily.  Marland Kitchen neomycin-bacitracin-polymyxin (NEOSPORIN) ointment Apply 1 application topically as needed for wound care.  . Pancrelipase, Lip-Prot-Amyl, (ZENPEP) 25000-79000 units CPEP Take 1-2 capsules by mouth See admin instructions. Take 2 capsules in the morning, 1 capsule with lunch, and 2 capsules in the evening.  . potassium chloride SA (K-DUR) 20 MEQ tablet TAKE 2 TABLETS ONCE DAILY, BUT ON THE DAY YOU  TAKE METOLAZONE TAKE AN EXTRA TABLET FOR A TOTAL OF 3 TABLETS (Patient taking differently: Take 40-60 mEq by mouth See admin instructions. Take 40 meq daily, when taking metolazone take a total of 60 meq daily)  . Semaglutide, 1 MG/DOSE, (OZEMPIC, 1 MG/DOSE,) 2 MG/1.5ML SOPN Inject 1 mg into the skin once a week. (Patient taking differently: Inject 1 mg into the skin every Thursday. )  . sertraline (ZOLOFT) 25 MG tablet Take 25 mg by mouth See admin instructions. Take with 50 mg to equal 75 mg at bedtime  . sertraline (ZOLOFT) 50 MG tablet TAKE 1 TABLET EVERY DAY (Patient taking differently: Take 50 mg by mouth See admin instructions. Take with 25mg  to equal 75 mg at bedtime)  . Tolnaftate (TINACTIN EX) Apply 1 application topically daily as needed (foot fungus).    Allergies  Allergen Reactions  . Other Anaphylaxis and Rash    Bleach  . Penicillins Hives    Did it involve swelling of the face/tongue/throat, SOB, or low BP? No Did it involve sudden or severe rash/hives, skin peeling, or any reaction on the inside of your mouth or nose? Yes Did you need to seek medical attention at a hospital or doctor's office? Yes When did it last happen?Childhood allergy  If all above answers are "NO", may proceed with cephalosporin use.    . Sulfa Antibiotics Hives  . Aspirin Other (See Comments)     On aspirin 81 mg - Rectal bleeding in dec 2018  . Codeine     Headache and makes the patient feel "off"     SOCIAL HISTORY/FAMILY HISTORY   Social  History   Tobacco Use  . Smoking status: Former Smoker    Packs/day: 0.50    Years: 25.00    Pack years: 12.50    Types: Cigarettes  . Smokeless tobacco: Never Used  . Tobacco comment: quit smoking 20+ytrs ago  Substance Use Topics  . Alcohol use: No  . Drug use: No   Social History   Social History Narrative   She lives in [place]. She has lots of family in the area and is accompanied by her sister.   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   She has a distant history of smoking, quit 20 years ago.    Family History family history includes Allergies in her father; Cancer in an other family member; Diabetes in her brother, mother, sister, and son; Heart disease in her father, mother, and sister; Heart failure in her father; Hyperlipidemia in her father, mother, and sister; Hypertension in her brother, father, mother, sister, sister, and son.   OBJCTIVE -PE, EKG, labs   Wt Readings from Last 3 Encounters:  05/04/19 223 lb 12.8 oz (101.5 kg)  04/06/19 222 lb (100.7 kg)  03/31/19 219 lb 3.2 oz (99.4 kg)    Physical Exam: BP 136/64   Pulse 65   Ht  5\' 1"  (1.549 m)   Wt 223 lb 12.8 oz (101.5 kg)   SpO2 92%   BMI 42.29 kg/m  Physical Exam  Constitutional: She is oriented to person, place, and time. She appears well-developed and well-nourished. No distress.  Morbidly obese woman.  Appears older than stated age.  Slow somewhat wide based gait.  HENT:  Head: Normocephalic and atraumatic.  Eyes: EOM are normal.  Neck: Normal range of motion. Neck supple. Hepatojugular reflux (Minimal, notably less than last visit) and JVD (Mildly elevated JVD) present. Carotid bruit is not present.  Cardiovascular: Normal rate, regular rhythm, S1 normal, S2 normal, intact distal pulses and normal pulses.  Occasional extrasystoles are present. PMI is not displaced (Very difficult to palpate). Exam reveals gallop. Exam reveals no S4, no distant heart sounds and  no friction rub.  Murmur (2/6 HSM along sternal border.) heard. Pulmonary/Chest: Effort normal and breath sounds normal. No respiratory distress. She has no wheezes. She has no rales. She exhibits no tenderness.  Abdominal: Soft. Bowel sounds are normal. She exhibits no distension. There is no abdominal tenderness. There is no rebound.  Truncal obesity  Musculoskeletal:        General: Deformity (Significant pes valgus) and edema (Trivial bilateral LE) present.  Neurological: She is alert and oriented to person, place, and time.  Psychiatric: She has a normal mood and affect. Her behavior is normal. Judgment and thought content normal.  Vitals reviewed.   Adult ECG Report  Recent Labs:    Lab Results  Component Value Date   CHOL 145 10/22/2018   HDL 70 10/22/2018   LDLCALC 65 10/22/2018   LDLDIRECT 66.0 01/27/2015   TRIG 52 10/22/2018   CHOLHDL 2.1 10/22/2018   Lab Results  Component Value Date   CREATININE 1.15 (H) 04/03/2019   BUN 20 04/03/2019   NA 139 04/06/2019   K 3.6 04/06/2019   CL 97 04/03/2019   CO2 29 04/03/2019    ASSESSMENT/PLAN    Problem List Items Addressed This Visit    Obesity hypoventilation syndrome (HCC) (Chronic)    Clearly this remains 1 possibility.  She is now on CPAP.  Not currently on calcium channel blocker although we could switch or add calcium channel blocker in lieu of relatively stable blood pressures here and another heart rate room.  Could consider bleeding oxygen with CPAP.  Needs to continue exercising which is clearly made difficult by her foot condition.      Morbid obesity with BMI of 45.0-49.9, adult (HCC) (Chronic)   Pulmonary hypertension, unspecified (HCC) (Chronic)    PA pressures not as high as would have expected based on echo reads.  Could very well still be related to OSA.  Continue to encourage use of CPAP, nasal strips etc.  Overall, losing weight will be the most beneficial point.  Low threshold to converting  hydralazine to verapamil or diltiazem.      Hypertensive heart disease with chronic diastolic congestive heart failure (HCC) - Primary (Chronic)    Hypertensive heart disease is possible, but LVEDP is not that high. Continue carvedilol, hydralazine at current dose  If blood pressure will tolerate, can consider restarting ARB.  Relatively euvolemic on exam.  Does have Zaroxolyn as needed but is mostly doing her standing dose of Lasix and we talked by sliding scale.      OSA (obstructive sleep apnea) - not on CPAP (Chronic)    Hard to tell how long she is actually truly had OSA.  We  will need to determine whether he needs a bleedin nighttime O2 with CPAP      Atherosclerotic heart disease of native coronary artery with other forms of angina pectoris (HCC)    Nonobstructive disease noted throughout.  No obvious stenoses.      Medication management    She is on lots of medications only a few of which I control.  We discussed the importance of each medication and time when she does take them and when she should potentially consider changing to different medicine.        Overall, she is doing much better.  Still has some issues, but needs to gradually work and activity level.  May very well benefit from home health nursing to assist.  We reviewed the various studies together and reiterated the importance of doing short-term bursts of increased Lasix to avoid prerenal condition.  Need to allow fluid levels to accumulate and normalize.   COVID-19 Education: The signs and symptoms of COVID-19 were discussed with the patient and how to seek care for testing (follow up with PCP or arrange E-visit).   The importance of social distancing was discussed today.  I spent a total of with the patient and chart review. >  50% of the time was spent in direct patient consultation.  Additional time spent with chart review (studies, outside notes, etc): 12 Total Time: 36 min   Current  medicines are reviewed at length with the patient today.  (+/- concerns) continued use of slide scale Lasix   Patient Instructions / Medication Changes & Studies & Tests Ordered   Patient Instructions  Medication Instructions:   YOU ARE DOING WELL WITH DRY WEIGHT.  IF YOU LESS THN 3 LBS OVER YOUR WEIGHT  FOLLOW  SLIDING SCALE  FOR THE NEXT DAY ,BUT FOLLOWING DAY  TAKE 40 MG LASIX  THEN RETURN TO REGULAR  DOSE.  IF YOU LESS THN 5 LBS OVER YOUR WEIGHT  FOLLOW  SLIDING SCALE  FOR THE NEXT 2 DAY ,BUT FOLLOWING DAYTAKE 40 MG LASIX  THEN RETURN TO REGULAR  DOSE.  *If you need a refill on your cardiac medications before your next appointment, please call your pharmacy*  Lab Work: NOT NEEDED  Testing/Procedures: NOT NEEDED  Follow-Up: At [facility], you and your health needs are our priority.  As part of our continuing mission to provide you with exceptional heart care, we have created designated Provider Care Teams.  These Care Teams include your primary Cardiologist (physician) and Advanced Practice Providers (APPs -  Physician Assistants and Nurse Practitioners) who all work together to provide you with the care you need, when you need it.  Your next appointment:   4 months  KATHRYN   8 MONTH S WITH DR [facility]   The format for your next appointment:   In Person  Provider:   Joni Reining, DNP, ANP- 4 MONTHS   DR Bengie Kaucher- 8 MONTHS   Other Instructions   DISCUSS WITH YOUR LUNG DOCTOR  ABOUT  OXYGEN POSSIBLE NEED WHILE YOU ARE WEARING YOUR CPAP    Studies Ordered:   No orders of the defined types were placed in this encounter.    Bryan Lemma, M.D., M.S. Interventional Cardiologist   Pager # [phone] Phone # [phone] [address] [place], [place] [address]   Thank you for choosing Heart[facility] at [facility]!!

## 2019-05-04 NOTE — Patient Instructions (Addendum)
Medication Instructions:   YOU ARE DOING WELL WITH DRY WEIGHT.  IF YOU LESS THN 3 LBS OVER YOUR WEIGHT  FOLLOW  SLIDING SCALE  FOR THE NEXT DAY ,BUT FOLLOWING DAY  TAKE 40 MG LASIX  THEN RETURN TO REGULAR  DOSE.  IF YOU LESS THN 5 LBS OVER YOUR WEIGHT  FOLLOW  SLIDING SCALE  FOR THE NEXT 2 DAY ,BUT FOLLOWING DAYTAKE 40 MG LASIX  THEN RETURN TO REGULAR  DOSE.  *If you need a refill on your cardiac medications before your next appointment, please call your pharmacy*  Lab Work: NOT NEEDED  Testing/Procedures: NOT NEEDED  Follow-Up: At [facility], you and your health needs are our priority.  As part of our continuing mission to provide you with exceptional heart care, we have created designated Provider Care Teams.  These Care Teams include your primary Cardiologist (physician) and Advanced Practice Providers (APPs -  Physician Assistants and Nurse Practitioners) who all work together to provide you with the care you need, when you need it.  Your next appointment:   4 months  KATHRYN   8 MONTH S WITH DR [facility]   The format for your next appointment:   In Person  Provider:   Jory Sims, DNP, ANP- 4 MONTHS   DR HARDING- 8 MONTHS   Other Instructions   DISCUSS WITH YOUR LUNG DOCTOR  ABOUT  OXYGEN POSSIBLE NEED WHILE YOU ARE WEARING YOUR CPAP

## 2019-05-05 DIAGNOSIS — G4733 Obstructive sleep apnea (adult) (pediatric): Secondary | ICD-10-CM | POA: Diagnosis not present

## 2019-05-06 ENCOUNTER — Encounter: Payer: Self-pay | Admitting: Cardiology

## 2019-05-06 NOTE — Assessment & Plan Note (Signed)
Hard to tell how long she is actually truly had OSA.  We will need to determine whether he needs a bleedin nighttime O2 with CPAP

## 2019-05-06 NOTE — Assessment & Plan Note (Signed)
She is on lots of medications only a few of which I control.  We discussed the importance of each medication and time when she does take them and when she should potentially consider changing to different medicine.

## 2019-05-06 NOTE — Assessment & Plan Note (Signed)
PA pressures not as high as would have expected based on echo reads.  Could very well still be related to OSA.  Continue to encourage use of CPAP, nasal strips etc.  Overall, losing weight will be the most beneficial point.  Low threshold to converting hydralazine to verapamil or diltiazem.

## 2019-05-06 NOTE — Assessment & Plan Note (Addendum)
Hypertensive heart disease is possible, but LVEDP is not that high. Continue carvedilol, hydralazine at current dose  If blood pressure will tolerate, can consider restarting ARB.  Relatively euvolemic on exam.  Does have Zaroxolyn as needed but is mostly doing her standing dose of Lasix and we talked by sliding scale.

## 2019-05-06 NOTE — Assessment & Plan Note (Signed)
Clearly this remains 1 possibility.  She is now on CPAP.  Not currently on calcium channel blocker although we could switch or add calcium channel blocker in lieu of relatively stable blood pressures here and another heart rate room.  Could consider bleeding oxygen with CPAP.  Needs to continue exercising which is clearly made difficult by her foot condition.

## 2019-05-06 NOTE — Assessment & Plan Note (Signed)
Nonobstructive disease noted throughout.  No obvious stenoses.

## 2019-05-07 ENCOUNTER — Ambulatory Visit: Payer: Medicare HMO | Admitting: Pulmonary Disease

## 2019-05-11 ENCOUNTER — Other Ambulatory Visit: Payer: Self-pay | Admitting: Internal Medicine

## 2019-05-11 DIAGNOSIS — F339 Major depressive disorder, recurrent, unspecified: Secondary | ICD-10-CM

## 2019-05-13 DIAGNOSIS — H26491 Other secondary cataract, right eye: Secondary | ICD-10-CM | POA: Diagnosis not present

## 2019-05-19 ENCOUNTER — Encounter: Payer: Self-pay | Admitting: Internal Medicine

## 2019-05-19 ENCOUNTER — Ambulatory Visit (INDEPENDENT_AMBULATORY_CARE_PROVIDER_SITE_OTHER): Payer: Medicare HMO | Admitting: Internal Medicine

## 2019-05-19 ENCOUNTER — Other Ambulatory Visit: Payer: Self-pay

## 2019-05-19 VITALS — BP 116/66 | HR 67 | Temp 98.3°F | Ht 61.0 in | Wt 221.2 lb

## 2019-05-19 DIAGNOSIS — I110 Hypertensive heart disease with heart failure: Secondary | ICD-10-CM | POA: Diagnosis not present

## 2019-05-19 DIAGNOSIS — N182 Chronic kidney disease, stage 2 (mild): Secondary | ICD-10-CM | POA: Diagnosis not present

## 2019-05-19 DIAGNOSIS — I5032 Chronic diastolic (congestive) heart failure: Secondary | ICD-10-CM | POA: Diagnosis not present

## 2019-05-19 DIAGNOSIS — Z794 Long term (current) use of insulin: Secondary | ICD-10-CM | POA: Diagnosis not present

## 2019-05-19 DIAGNOSIS — J449 Chronic obstructive pulmonary disease, unspecified: Secondary | ICD-10-CM | POA: Diagnosis not present

## 2019-05-19 DIAGNOSIS — Z6841 Body Mass Index (BMI) 40.0 and over, adult: Secondary | ICD-10-CM | POA: Diagnosis not present

## 2019-05-19 DIAGNOSIS — E1122 Type 2 diabetes mellitus with diabetic chronic kidney disease: Secondary | ICD-10-CM | POA: Diagnosis not present

## 2019-05-19 NOTE — Patient Instructions (Signed)
Exercises To Do While Sitting  Exercises that you do while sitting (chair exercises) can give you many of the same benefits as full exercise. Benefits include strengthening your heart, burning calories, and keeping muscles and joints healthy. Exercise can also improve your mood and help with depression and anxiety. You may benefit from chair exercises if you are unable to do standing exercises because of:  Diabetic foot pain.  Obesity.  Illness.  Arthritis.  Recovery from surgery or injury.  Breathing problems.  Balance problems.  Another type of disability. Before starting chair exercises, check with your health care provider or a physical therapist to find out how much exercise you can tolerate and which exercises are safe for you. If your health care provider approves:  Start out slowly and build up over time. Aim to work up to about 10-20 minutes for each exercise session.  Make exercise part of your daily routine.  Drink water when you exercise. Do not wait until you are thirsty. Drink every 10-15 minutes.  Stop exercising right away if you have pain, nausea, shortness of breath, or dizziness.  If you are exercising in a wheelchair, make sure to lock the wheels.  Ask your health care provider whether you can do tai chi or yoga. Many positions in these mind-body exercises can be modified to do while seated. Warm-up Before starting other exercises: 1. Sit up as straight as you can. Have your knees bent at 90 degrees, which is the shape of the capital letter "L." Keep your feet flat on the floor. 2. Sit at the front edge of your chair, if you can. 3. Pull in (tighten) the muscles in your abdomen and stretch your spine and neck as straight as you can. Hold this position for a few minutes. 4. Breathe in and out evenly. Try to concentrate on your breathing, and relax your mind. Stretching Exercise A: Arm stretch 1. Hold your arms out straight in front of your body. 2. Bend  your hands at the wrist with your fingers pointing up, as if signaling someone to stop. Notice the slight tension in your forearms as you hold the position. 3. Keeping your arms out and your hands bent, rotate your hands outward as far as you can and hold this stretch. Aim to have your thumbs pointing up and your pinkie fingers pointing down. Slowly repeat arm stretches for one minute as tolerated. Exercise B: Leg stretch 1. If you can move your legs, try to "draw" letters on the floor with the toes of your foot. Write your name with one foot. 2. Write your name with the toes of your other foot. Slowly repeat the movements for one minute as tolerated. Exercise C: Reach for the sky 1. Reach your hands as far over your head as you can to stretch your spine. 2. Move your hands and arms as if you are climbing a rope. Slowly repeat the movements for one minute as tolerated. Range of motion exercises Exercise A: Shoulder roll 1. Let your arms hang loosely at your sides. 2. Lift just your shoulders up toward your ears, then let them relax back down. 3. When your shoulders feel loose, rotate your shoulders in backward and forward circles. Do shoulder rolls slowly for one minute as tolerated. Exercise B: March in place 1. As if you are marching, pump your arms and lift your legs up and down. Lift your knees as high as you can. ? If you are unable to lift your knees,  just pump your arms and move your ankles and feet up and down. March in place for one minute as tolerated. Exercise C: Seated jumping jacks 1. Let your arms hang down straight. 2. Keeping your arms straight, lift them up over your head. Aim to point your fingers to the ceiling. 3. While you lift your arms, straighten your legs and slide your heels along the floor to your sides, as wide as you can. 4. As you bring your arms back down to your sides, slide your legs back together. ? If you are unable to use your legs, just move your arms.  Slowly repeat seated jumping jacks for one minute as tolerated. Strengthening exercises Exercise A: Shoulder squeeze 1. Hold your arms straight out from your body to your sides, with your elbows bent and your fists pointed at the ceiling. 2. Keeping your arms in the bent position, move them forward so your elbows and forearms meet in front of your face. 3. Open your arms back out as wide as you can with your elbows still bent, until you feel your shoulder blades squeezing together. Hold for 5 seconds. Slowly repeat the movements forward and backward for one minute as tolerated. Contact a health care provider if you:  Had to stop exercising due to any of the following: ? Pain. ? Nausea. ? Shortness of breath. ? Dizziness. ? Fatigue.  Have significant pain or soreness after exercising. Get help right away if you have:  Chest pain.  Difficulty breathing. These symptoms may represent a serious problem that is an emergency. Do not wait to see if the symptoms will go away. Get medical help right away. Call your local emergency services (911 in the [place] Do not drive yourself to the hospital. This information is not intended to replace advice given to you by your health care provider. Make sure you discuss any questions you have with your health care provider. Document Released: 04/24/2017 Document Revised: 10/02/2018 Document Reviewed: 04/24/2017 Elsevier Patient Education  2020 [employer].

## 2019-05-20 ENCOUNTER — Encounter: Payer: Self-pay | Admitting: Adult Health

## 2019-05-20 ENCOUNTER — Telehealth: Payer: Self-pay

## 2019-05-20 ENCOUNTER — Ambulatory Visit: Payer: Medicare HMO | Admitting: Adult Health

## 2019-05-20 LAB — HEMOGLOBIN A1C
Est. average glucose Bld gHb Est-mCnc: 169 mg/dL
Hgb A1c MFr Bld: 7.5 % — ABNORMAL HIGH (ref 4.8–5.6)

## 2019-05-20 NOTE — Telephone Encounter (Signed)
-----   Message from [name], MD sent at 05/20/2019  1:34 PM EST ----- I am so proud of you! Your hba1c has improved, down to 7.5. Keep up the great work !

## 2019-05-20 NOTE — Telephone Encounter (Signed)
Unable to leave a voice mail.

## 2019-05-22 DIAGNOSIS — Z6841 Body Mass Index (BMI) 40.0 and over, adult: Secondary | ICD-10-CM | POA: Insufficient documentation

## 2019-05-22 NOTE — Progress Notes (Signed)
Subjective:     Patient ID: [name] , female    DOB: 01-27-1946 , 73 y.o.   MRN: [id number]   Chief Complaint  Patient presents with  . Diabetes  . Hypertension    HPI  She is here today for a diabetes check. She reports her blood sugars have improved. She also admits to moving more. She is pleased with her progress.   Diabetes She presents for her follow-up diabetic visit. She has type 2 diabetes mellitus. Her disease course has been improving. There are no hypoglycemic associated symptoms. Pertinent negatives for diabetes include no blurred vision and no chest pain. There are no hypoglycemic complications. Diabetic complications include nephropathy. Risk factors for coronary artery disease include diabetes mellitus, dyslipidemia, hypertension, obesity, post-menopausal and sedentary lifestyle. She is compliant with treatment some of the time. Her weight is fluctuating minimally. She is following a generally healthy diet. She never participates in exercise. Her home blood glucose trend is decreasing steadily. Her breakfast blood glucose is taken between 8-9 am. Her breakfast blood glucose range is generally 130-140 mg/dl. An ACE inhibitor/angiotensin II receptor blocker is being taken. Eye exam is not current.  Hypertension This is a chronic problem. The current episode started more than 1 year ago. The problem has been gradually improving since onset. The problem is controlled. Pertinent negatives include no blurred vision, chest pain, palpitations or shortness of breath. The current treatment provides moderate improvement. Hypertensive end-organ damage includes kidney disease. Identifiable causes of hypertension include sleep apnea.     Past Medical History:  Diagnosis Date  . Abnormal liver function     in the past.  . Anemia   . Arthritis    all over  . Bruises easily   . Cataract    right eye;immature  . Chronic back pain    stenosis  . Chronic cough   . Chronic kidney  disease   . COPD (chronic obstructive pulmonary disease) ([place])   . Demand myocardial infarction [facility]) 2012   Demand Infarction in setting of Pancreatitis --> mild Troponin elevation, NON-OBSTRUCTIVE CAD  . Depression    takes Abilify daily as well as Zoloft  . Diastolic heart failure 3557   Grade 1 diastolic Dysfunction by Echo   . Diverticulosis   . DM (diabetes mellitus) ([place])    takes Victoza daily as well as Lantus and Humalog  . Empty sella ([place])    on MRI in 2009.  . Glaucoma   . Headache(784.0)    last migraine-4-35yr ago  . History of blood transfusion    no abnormal reaction noted  . History of colon polyps    benign  . HTN (hypertension)    takes BK[employer] Morgan Energydaily  . Hyperlipidemia    takes Lipitor daily  . Joint swelling   . Nocturia   . Obesity hypoventilation syndrome ([place]   . Obstructive sleep apnea 02/2018   Notably improved split-night study with weight loss from 270 (2017) down to 250 pounds (2019).  . Pancreatitis    takes Pancrelipase daily  . Parkinson's disease ([place]    takes Sinemet daily  . Peripheral neuropathy   . Pneumonia 2012  . [place]izures [place]    takes Lamictal daily and Primidone nightly;last seizure 2wks ago  . Urinary urgency    With increased frequency  . Varicose veins of both lower extremities with pain    With edema.  Takes daily Lasix     Family History  Problem Relation Age  of Onset  . Allergies Father   . Heart disease Father        before 21  . Heart failure Father   . Hypertension Father   . Hyperlipidemia Father   . Heart disease Mother   . Diabetes Mother   . Hypertension Mother   . Hyperlipidemia Mother   . Hypertension Sister   . Heart disease Sister        before 63  . Hyperlipidemia Sister   . Diabetes Brother   . Hypertension Brother   . Diabetes Sister   . Hypertension Sister   . Diabetes Son   . Hypertension Son   . Cancer Other      Current Outpatient Medications:  .  Accu-Chek  Softclix Lancets lancets, Use as instructed to check blood sugars 2 times per day dx: e11.22, Disp: 300 each, Rfl: 3 .  acetaminophen (TYLENOL) 500 MG tablet, Take 500-1,000 mg by mouth every 4 (four) hours as needed for moderate pain. , Disp: , Rfl:  .  albuterol (PROVENTIL HFA;VENTOLIN HFA) 108 (90 Base) MCG/ACT inhaler, Inhale 1-2 puffs into the lungs every 6 (six) hours as needed for wheezing or shortness of breath (DOE WHEEZING SOB). (Patient taking differently: Inhale 1-2 puffs into the lungs every 6 (six) hours as needed for wheezing or shortness of breath. ), Disp: 1 Inhaler, Rfl: 1 .  atorvastatin (LIPITOR) 10 MG tablet, TAKE 1 TABLET EVERY DAY (Patient taking differently: Take 10 mg by mouth every evening. ), Disp: 90 tablet, Rfl: 1 .  B Complex-C (B-COMPLEX WITH VITAMIN C) tablet, Take 1 tablet by mouth daily., Disp: , Rfl:  .  Blood Glucose Calibration (ACCU-CHEK AVIVA) SOLN, Use as directed dx: e11.22, Disp: 1 each, Rfl: 3 .  Blood Glucose Monitoring Suppl (ACCU-CHEK AVIVA PLUS) w/Device KIT, Use as directed to check blood sugars 2 times per day dx: e11.22, Disp: 1 kit, Rfl: 3 .  carvedilol (COREG) 25 MG tablet, Take 1 tablet (25 mg total) by mouth 2 (two) times daily with a meal., Disp: 180 tablet, Rfl: 3 .  Cholecalciferol (VITAMIN D-3) 1000 units CAPS, Take 1,000 Units by mouth daily., Disp: , Rfl:  .  diclofenac sodium (VOLTAREN) 1 % GEL, Apply 2 g topically 4 (four) times daily as needed (pain)., Disp: , Rfl:  .  DROPLET PEN NEEDLES 31G X 8 MM MISC, USE DAILY WITH VICTOZA AND/OR NOVOLOG. , Disp: 400 each, Rfl: 1 .  furosemide (LASIX) 40 MG tablet, Take one tablet (49m) by mouth on Saturday and Sunday only. (Patient taking differently: Take 40 mg by mouth See admin instructions. Take one tablet (423m by mouth on Saturday and Sunday only.), Disp: 180 tablet, Rfl: 3 .  furosemide (LASIX) 80 MG tablet, Take one tablet (802mby mouth Monday through Friday only. (Patient taking  differently: Take 80 mg by mouth See admin instructions. Take one tablet (9m68my mouth Monday through Friday only.), Disp: 90 tablet, Rfl: 3 .  glucose blood (ACCU-CHEK AVIVA PLUS) test strip, Use as instructed to check blood sugars 2 times per day dx: e11.22, Disp: 300 each, Rfl: 3 .  hydrALAZINE (APRESOLINE) 100 MG tablet, Take 1 tablet (100 mg total) by mouth 2 (two) times daily., Disp: 180 tablet, Rfl: 3 .  hydrocortisone cream 1 %, Apply 1 application topically daily as needed for itching., Disp: , Rfl:  .  Insulin Glargine, 1 Unit Dial, (TOUJEO SOLOSTAR) 300 UNIT/ML SOPN, Inject 36 Units into the skin at bedtime. (Patient taking  differently: Inject 34 Units into the skin at bedtime. ), Disp: 15 pen, Rfl: 2 .  insulin lispro (HUMALOG) 100 UNIT/ML injection, Inject 2-4 Units into the skin 3 (three) times daily as needed (high blood sugar over 150)., Disp: , Rfl:  .  Isopropyl Alcohol (ALCOHOL WIPES) 70 % MISC, Apply 1 each topically 2 (two) times daily., Disp: 300 each, Rfl: 3 .  isosorbide mononitrate (IMDUR) 60 MG 24 hr tablet, Take 1 tablet (60 mg total) by mouth daily., Disp: 90 tablet, Rfl: 3 .  lamoTRIgine (LAMICTAL) 100 MG tablet, Take 1 tablet twice a day (Take with 76m twice a day to make a total of 1234mtwice a day) (Patient taking differently: Take 100 mg by mouth See admin instructions. Take with 25 mg tablet to equal 125 mg twice daily), Disp: 180 tablet, Rfl: 3 .  lamoTRIgine (LAMICTAL) 25 MG tablet, Take 1 tablet twice a day (Take with 10086mablet twice a day, to make it 125m40mice a day) (Patient taking differently: Take 25 mg by mouth See admin instructions. Take with 100 mg tablet to equal 125 mg twice daily), Disp: 180 tablet, Rfl: 3 .  Menthol, Topical Analgesic, (STOPAIN ROLL-ON EX), Apply 1 application topically daily as needed (pain)., Disp: , Rfl:  .  metolazone (ZAROXOLYN) 2.5 MG tablet, Take 2.5 mg by mouth See admin instructions. Take 2.5 mg by mouth Monday through  Friday as needed if weight is greater or equal to 269 lbs, Disp: , Rfl:  .  Multiple Vitamins-Minerals (MULTIVITAMIN WITH MINERALS) tablet, Take 1 tablet by mouth daily., Disp: , Rfl:  .  neomycin-bacitracin-polymyxin (NEOSPORIN) ointment, Apply 1 application topically as needed for wound care., Disp: , Rfl:  .  Pancrelipase, Lip-Prot-Amyl, (ZENPEP) 25000-79000 units CPEP, Take 1-2 capsules by mouth See admin instructions. Take 2 capsules in the morning, 1 capsule with lunch, and 2 capsules in the evening., Disp: , Rfl:  .  potassium chloride SA (K-DUR) 20 MEQ tablet, TAKE 2 TABLETS ONCE DAILY, BUT ON THE DAY YOU  TAKE METOLAZONE TAKE AN EXTRA TABLET FOR A TOTAL OF 3 TABLETS (Patient taking differently: Take 40-60 mEq by mouth See admin instructions. Take 40 meq daily, when taking metolazone take a total of 60 meq daily), Disp: 204 tablet, Rfl: 0 .  Semaglutide, 1 MG/DOSE, (OZEMPIC, 1 MG/DOSE,) 2 MG/1.5ML SOPN, Inject 1 mg into the skin once a week. (Patient taking differently: Inject 1 mg into the skin every Thursday. ), Disp: 3 pen, Rfl: 1 .  sertraline (ZOLOFT) 25 MG tablet, Take 25 mg by mouth See admin instructions. Take with 50 mg to equal 75 mg at bedtime, Disp: , Rfl:  .  sertraline (ZOLOFT) 50 MG tablet, TAKE 1 TABLET EVERY DAY, Disp: 90 tablet, Rfl: 2 .  Tolnaftate (TINACTIN EX), Apply 1 application topically daily as needed (foot fungus)., Disp: , Rfl:    Allergies  Allergen Reactions  . Other Anaphylaxis and Rash    Bleach  . Penicillins Hives    Did it involve swelling of the face/tongue/throat, SOB, or low BP? No Did it involve sudden or severe rash/hives, skin peeling, or any reaction on the inside of your mouth or nose? Yes Did you need to seek medical attention at a hospital or doctor's office? Yes When did it last happen?Childhood allergy  If all above answers are "NO", may proceed with cephalosporin use.    . Sulfa Antibiotics Hives  . Aspirin Other (See Comments)      On  aspirin 81 mg - Rectal bleeding in dec 2018  . Codeine     Headache and makes the patient feel "off"     Review of Systems  Constitutional: Negative.   Eyes: Negative for blurred vision.  Respiratory: Negative.  Negative for shortness of breath.   Cardiovascular: Negative.  Negative for chest pain and palpitations.  Gastrointestinal: Negative.   Neurological: Negative.   Psychiatric/Behavioral: Negative.      Today's Vitals   05/19/19 1420  BP: 116/66  Pulse: 67  Temp: 98.3 F (36.8 C)  TempSrc: Oral  Weight: 221 lb 3.2 oz (100.3 kg)  Height: 5' 1"  (1.549 m)  PainSc: 0-No pain   Body mass index is 41.8 kg/m.   Objective:  Physical Exam Vitals signs and nursing note reviewed.  Constitutional:      Appearance: Normal appearance. She is obese.  HENT:     Head: Normocephalic and atraumatic.  Cardiovascular:     Rate and Rhythm: Normal rate and regular rhythm.     Heart sounds: Murmur present.  Pulmonary:     Effort: Pulmonary effort is normal.     Breath sounds: Normal breath sounds.  Musculoskeletal:     Right lower leg: 1+ Pitting Edema present.     Left lower leg: 1+ Pitting Edema present.  Skin:    General: Skin is warm.  Neurological:     General: No focal deficit present.     Mental Status: She is alert.  Psychiatric:        Mood and Affect: Mood normal.        Behavior: Behavior normal.         Assessment And Plan:     1. Type 2 diabetes mellitus with stage 2 chronic kidney disease, with long-term current use of insulin (HCC)  Chronic. I will check an a1c today. Importance of dietary compliance was discussed with the patient. She was commended for her lifestyle changes.   - Hemoglobin A1c  2. Hypertensive heart disease with chronic diastolic congestive heart failure (HCC)  Chronic. Well controlled. She will continue with current meds. She is encouraged to follow a low sodium diet.   3. Chronic diastolic CHF (congestive heart failure), NYHA  class 2 (HCC)  Chronic. Her LE edema has much improved. She was congratulated on her progress.   4. Class 3 severe obesity due to excess calories with serious comorbidity and body mass index (BMI) of 40.0 to 44.9 in adult [facility])  She has lost two pounds since earlier this month. She is encouraged to strive to lose 10 pounds prior to her next visit in 3 months.   [name], MD    THE PATIENT IS ENCOURAGED TO PRACTICE SOCIAL DISTANCING DUE TO THE COVID-19 PANDEMIC.

## 2019-05-25 ENCOUNTER — Telehealth: Payer: Self-pay

## 2019-05-25 ENCOUNTER — Ambulatory Visit: Payer: Medicare HMO | Admitting: Adult Health

## 2019-05-25 ENCOUNTER — Other Ambulatory Visit: Payer: Self-pay | Admitting: Cardiology

## 2019-05-25 NOTE — Telephone Encounter (Signed)
-----   Message from [name], MD sent at 05/20/2019  1:34 PM EST ----- I am so proud of you! Your hba1c has improved, down to 7.5. Keep up the great work !

## 2019-05-25 NOTE — Telephone Encounter (Signed)
Patient's sister [name] notified of the pt's most recent lab results.

## 2019-05-26 ENCOUNTER — Telehealth: Payer: Self-pay

## 2019-05-28 ENCOUNTER — Other Ambulatory Visit: Payer: Self-pay

## 2019-05-28 ENCOUNTER — Ambulatory Visit (INDEPENDENT_AMBULATORY_CARE_PROVIDER_SITE_OTHER): Payer: Medicare HMO | Admitting: Pulmonary Disease

## 2019-05-28 ENCOUNTER — Encounter: Payer: Self-pay | Admitting: Pulmonary Disease

## 2019-05-28 DIAGNOSIS — E662 Morbid (severe) obesity with alveolar hypoventilation: Secondary | ICD-10-CM | POA: Diagnosis not present

## 2019-05-28 DIAGNOSIS — Z9989 Dependence on other enabling machines and devices: Secondary | ICD-10-CM

## 2019-05-28 DIAGNOSIS — G4733 Obstructive sleep apnea (adult) (pediatric): Secondary | ICD-10-CM

## 2019-05-28 NOTE — Assessment & Plan Note (Signed)
Appears to be improved with weight loss Continue current dose of Lasix 80 mg daily and 40 mg on weekends

## 2019-05-28 NOTE — Progress Notes (Signed)
   Subjective:    Patient ID: [name], female    DOB: 09-04-1945, 73 y.o.   MRN: [id number]  HPI   I connected with  [name] on 05/28/19 by phone and verified that I am speaking with the correct person using two identifiers.   I discussed the limitations of evaluation and management by telemedicine. The patient expressed understanding and agreed to proceed.    73 yo obese remote smoker for FU of OSA and obesity hypoventilation syndrome &  dyspnea on exertion. She smoked less than 20 pack years before she quit in her 43s.  PMH - hypertensive heart disease and chronic diastolic heart failure.    She is doing well overall, dyspnea is much improved.  She is not on oxygen anymore. Weight has decreased to 223 pounds from 253 pounds last year She is compliant with Lasix She is compliant with her CPAP machine and denies any problems with mask or pressure. Download was reviewed which shows compliance about 5 hours a night with good control of events on auto settings 611 cm with average pressure of 10 centimeters  Significant tests/ events reviewed  03/2016 NPSG was 60/hour, Epworth sleepiness score then was 17 she was prescribed CPAP but was unable to obtain a machine due to cost although neurology notes indicate that she was also offered a refurbished machine by [facility] at no cost CPAP >>  9 cm and she required 2 L of oxygen  N PSG 02/2018 -weight 258 pounds-showed to 60 minutes of supine sleep with AHI of 7.4/hour mostly during REM sleep   06/2018 On ambulation, she desaturated from 94% to 90% with one lap with heart rate increasing from 61-82 and ambulation was stopped due to pressure in her chest  ABG 05/2017 was 7.41/50 1/50 5/88% on room air.   PFT- 12/2010- Mild restriction and reduction of DLCO. FEV1/FVC 0.86, TLC 68%; DLCO 60%.   cardiac evaluation-echo 01/2018 showed grade 2 diastolic dysfunction with RVSP of 67 mm and normal LV systolic function Coronary CT  was inconclusive and cardiac cath 04/2018 showed normal coronaries with a EDP of 21.    Review of Systems Patient denies significant dyspnea,cough, hemoptysis,  chest pain, palpitations, pedal edema, orthopnea, paroxysmal nocturnal dyspnea, lightheadedness, nausea, vomiting, abdominal or  leg pains      Objective:   Physical Exam   -Unable since phone visit      Assessment & Plan:    As charted on problem list

## 2019-05-28 NOTE — Patient Instructions (Signed)
CPAP is working well on current auto settings 6 to 11 cm Congratulations on weight loss Continue Lasix

## 2019-05-28 NOTE — Assessment & Plan Note (Signed)
Current settings are working well.  She is compliant although this could be better. She has subjective benefit in daytime somnolence and fatigue If continues on this weight loss trajectory would repeat a home sleep test in 1 year  Weight loss encouraged, compliance with goal of at least 4-6 hrs every night is the expectation. Advised against medications with sedative side effects Cautioned against driving when sleepy - understanding that sleepiness will vary on a day to day basis

## 2019-05-29 ENCOUNTER — Telehealth: Payer: Self-pay

## 2019-06-04 DIAGNOSIS — G4733 Obstructive sleep apnea (adult) (pediatric): Secondary | ICD-10-CM | POA: Diagnosis not present

## 2019-06-08 ENCOUNTER — Telehealth: Payer: Self-pay | Admitting: Pharmacist

## 2019-06-22 ENCOUNTER — Other Ambulatory Visit: Payer: Self-pay | Admitting: Adult Health

## 2019-06-24 ENCOUNTER — Encounter: Payer: Self-pay | Admitting: Family Medicine

## 2019-06-24 ENCOUNTER — Ambulatory Visit (INDEPENDENT_AMBULATORY_CARE_PROVIDER_SITE_OTHER): Payer: Medicare HMO | Admitting: Family Medicine

## 2019-06-24 ENCOUNTER — Other Ambulatory Visit: Payer: Self-pay

## 2019-06-24 VITALS — BP 127/67 | HR 63 | Temp 97.6°F | Ht 61.0 in | Wt 224.2 lb

## 2019-06-24 DIAGNOSIS — J449 Chronic obstructive pulmonary disease, unspecified: Secondary | ICD-10-CM | POA: Diagnosis not present

## 2019-06-24 DIAGNOSIS — Z9989 Dependence on other enabling machines and devices: Secondary | ICD-10-CM

## 2019-06-24 DIAGNOSIS — G4733 Obstructive sleep apnea (adult) (pediatric): Secondary | ICD-10-CM | POA: Diagnosis not present

## 2019-06-24 NOTE — Progress Notes (Signed)
PATIENT: [name] DOB: 06/30/1945  REASON FOR VISIT: follow up HISTORY FROM: patient  Chief Complaint  Patient presents with  . Follow-up    7 mon f/u. Alone. Rm 8. No new concerns at this time.      HISTORY OF PRESENT ILLNESS: Today 06/24/19 [name] is a 73 y.o. female here today for follow up of OSA on CPAP.  She reports that she is doing very well on CPAP therapy.  She is followed regularly by Dr. Elsworth Soho with pulmonology.  She was recently seen on 05/28/2019 for CPAP compliance review.  She is also followed by Dr. Elsworth Soho for obesity hypoventilation syndrome.  She has an extensive heart history as well followed by cardiology.  She is a patient of Dr. Delice Lesch with [facility] for seizure management.  She has no concerns with CPAP therapy.  She was seen by Dr. Brett Fairy last year for sleep study.   Compliance report dated 05/24/2019 through 06/22/2019 reveals that she used CPAP 27 of the last 30 days for compliance of 90%.  24 of those days she used CPAP greater than 4 hours for compliance of 80%.  Average usage was 5 hours and 43 minutes.  Visual AHI was 0.7 on 6 to 11 cm of water and an EPR of 3.  There was no significant leak noted.  HISTORY: (copied from [facility] note on 11/18/2018)  [name] is a 73 y.o. female who has been followed in this office for OSA on cpap. Shewas initially scheduled for face-to-face office follow up visit today time but due to [place] rescheduled for non-face-to-face telephone visit with patients consent. Unable to participate in video visit due to lack of access to device with camera.   [name] is a 73 year old female with a history of obstructive sleep apnea on CPAP.  She was scheduled for virtual visit however we had to convert to a telephone visit as her computer kept freezing up.  Her CPAP download indicates that she use her machine nightly for compliance of 100%.  She use her machine greater than 4 hours 27 out  of 30 days for compliance of 90%.  On average she uses her machine 5 hours and 32 minutes.  Her residual AHI 0.9 on 6 to 11 cm of water with EPR 3.  She does not have a significant leak.  She continues to find the CPAP beneficial.  Denies any new issues.   REVIEW OF SYSTEMS: Out of a complete 14 system review of symptoms, the patient complains only of the following symptoms, blurred vision, shortness of breath, leg swelling, nausea, frequent waking, joint pain, back pain, itching, bruise easily, dizziness, seizure, passing out, depression and all other reviewed systems are negative.  Epworth sleepiness scale: 3  ALLERGIES: Allergies  Allergen Reactions  . Other Anaphylaxis and Rash    Bleach  . Penicillins Hives    Did it involve swelling of the face/tongue/throat, SOB, or low BP? No Did it involve sudden or severe rash/hives, skin peeling, or any reaction on the inside of your mouth or nose? Yes Did you need to seek medical attention at a hospital or doctor's office? Yes When did it last happen?Childhood allergy  If all above answers are "NO", may proceed with cephalosporin use.    . Sulfa Antibiotics Hives  . Aspirin Other (See Comments)     On aspirin 81 mg - Rectal bleeding in dec 2018  . Codeine     Headache  and makes the patient feel "off"    HOME MEDICATIONS: Outpatient Medications Prior to Visit  Medication Sig Dispense Refill  . Accu-Chek Softclix Lancets lancets Use as instructed to check blood sugars 2 times per day dx: e11.22 300 each 3  . acetaminophen (TYLENOL) 500 MG tablet Take 500-1,000 mg by mouth every 4 (four) hours as needed for moderate pain.     Marland Kitchen albuterol (PROVENTIL HFA;VENTOLIN HFA) 108 (90 Base) MCG/ACT inhaler Inhale 1-2 puffs into the lungs every 6 (six) hours as needed for wheezing or shortness of breath (DOE WHEEZING SOB). (Patient taking differently: Inhale 1-2 puffs into the lungs every 6 (six) hours as needed for wheezing or shortness of breath.  ) 1 Inhaler 1  . atorvastatin (LIPITOR) 10 MG tablet TAKE 1 TABLET EVERY DAY (Patient taking differently: Take 10 mg by mouth every evening. ) 90 tablet 1  . B Complex-C (B-COMPLEX WITH VITAMIN C) tablet Take 1 tablet by mouth daily.    . Blood Glucose Calibration (ACCU-CHEK AVIVA) SOLN Use as directed dx: e11.22 1 each 3  . Blood Glucose Monitoring Suppl (ACCU-CHEK AVIVA PLUS) w/Device KIT Use as directed to check blood sugars 2 times per day dx: e11.22 1 kit 3  . carvedilol (COREG) 25 MG tablet Take 1 tablet (25 mg total) by mouth 2 (two) times daily with a meal. 180 tablet 3  . Cholecalciferol (VITAMIN D-3) 1000 units CAPS Take 1,000 Units by mouth daily.    . diclofenac sodium (VOLTAREN) 1 % GEL Apply 2 g topically 4 (four) times daily as needed (pain).    . DROPLET PEN NEEDLES 31G X 8 MM MISC USE DAILY WITH VICTOZA AND/OR NOVOLOG.  400 each 1  . furosemide (LASIX) 40 MG tablet Take one tablet (58m) by mouth on Saturday and Sunday only. (Patient taking differently: Take 40 mg by mouth See admin instructions. Take one tablet (493m by mouth on Saturday and Sunday only.) 180 tablet 3  . furosemide (LASIX) 80 MG tablet TAKE ONE TABLET BY MOUTH DAILY  MONDAY THRU FRIDAY 60 tablet 11  . glucose blood (ACCU-CHEK AVIVA PLUS) test strip Use as instructed to check blood sugars 2 times per day dx: e11.22 300 each 3  . hydrALAZINE (APRESOLINE) 100 MG tablet Take 1 tablet (100 mg total) by mouth 2 (two) times daily. 180 tablet 2  . hydrocortisone cream 1 % Apply 1 application topically daily as needed for itching.    . Insulin Glargine, 1 Unit Dial, (TOUJEO SOLOSTAR) 300 UNIT/ML SOPN Inject 36 Units into the skin at bedtime. (Patient taking differently: Inject 34 Units into the skin at bedtime. ) 15 pen 2  . insulin lispro (HUMALOG) 100 UNIT/ML injection Inject 2-4 Units into the skin 3 (three) times daily as needed (high blood sugar over 150).    . Isopropyl Alcohol (ALCOHOL WIPES) 70 % MISC Apply 1 each  topically 2 (two) times daily. 300 each 3  . isosorbide mononitrate (IMDUR) 60 MG 24 hr tablet Take 1 tablet (60 mg total) by mouth daily. 90 tablet 3  . lamoTRIgine (LAMICTAL) 100 MG tablet Take 1 tablet twice a day (Take with 2515mwice a day to make a total of 125m65mice a day) (Patient taking differently: Take 100 mg by mouth See admin instructions. Take with 25 mg tablet to equal 125 mg twice daily) 180 tablet 3  . lamoTRIgine (LAMICTAL) 25 MG tablet Take 1 tablet twice a day (Take with 100mg52mlet twice a day, to make  it '125mg'$  twice a day) (Patient taking differently: Take 25 mg by mouth See admin instructions. Take with 100 mg tablet to equal 125 mg twice daily) 180 tablet 3  . Menthol, Topical Analgesic, (STOPAIN ROLL-ON EX) Apply 1 application topically daily as needed (pain).    Marland Kitchen metolazone (ZAROXOLYN) 2.5 MG tablet Take 2.5 mg by mouth See admin instructions. Take 2.5 mg by mouth Monday through Friday as needed if weight is greater or equal to 269 lbs    . Multiple Vitamins-Minerals (MULTIVITAMIN WITH MINERALS) tablet Take 1 tablet by mouth daily.    Marland Kitchen neomycin-bacitracin-polymyxin (NEOSPORIN) ointment Apply 1 application topically as needed for wound care.    . Pancrelipase, Lip-Prot-Amyl, (ZENPEP) 25000-79000 units CPEP Take 1-2 capsules by mouth See admin instructions. Take 2 capsules in the morning, 1 capsule with lunch, and 2 capsules in the evening.    . potassium chloride SA (K-DUR) 20 MEQ tablet TAKE 2 TABLETS ONCE DAILY, BUT ON THE DAY YOU  TAKE METOLAZONE TAKE AN EXTRA TABLET FOR A TOTAL OF 3 TABLETS (Patient taking differently: Take 40-60 mEq by mouth See admin instructions. Take 40 meq daily, when taking metolazone take a total of 60 meq daily) 204 tablet 0  . Semaglutide, 1 MG/DOSE, (OZEMPIC, 1 MG/DOSE,) 2 MG/1.5ML SOPN Inject 1 mg into the skin once a week. (Patient taking differently: Inject 1 mg into the skin every Thursday. ) 3 pen 1  . sertraline (ZOLOFT) 50 MG tablet TAKE  1 TABLET EVERY DAY 90 tablet 2  . Tolnaftate (TINACTIN EX) Apply 1 application topically daily as needed (foot fungus).    . sertraline (ZOLOFT) 25 MG tablet Take 25 mg by mouth See admin instructions. Take with 50 mg to equal 75 mg at bedtime     No facility-administered medications prior to visit.    PAST MEDICAL HISTORY: Past Medical History:  Diagnosis Date  . Abnormal liver function     in the past.  . Anemia   . Arthritis    all over  . Bruises easily   . Cataract    right eye;immature  . Chronic back pain    stenosis  . Chronic cough   . Chronic kidney disease   . COPD (chronic obstructive pulmonary disease) ([place])   . Demand myocardial infarction [facility]) 2012   Demand Infarction in setting of Pancreatitis --> mild Troponin elevation, NON-OBSTRUCTIVE CAD  . Depression    takes Abilify daily as well as Zoloft  . Diastolic heart failure 2094   Grade 1 diastolic Dysfunction by Echo   . Diverticulosis   . DM (diabetes mellitus) ([place])    takes Victoza daily as well as Lantus and Humalog  . Empty sella ([place])    on MRI in 2009.  . Glaucoma   . Headache(784.0)    last migraine-4-72yr ago  . History of blood transfusion    no abnormal reaction noted  . History of colon polyps    benign  . HTN (hypertension)    takes BKinder Morgan Energydaily  . Hyperlipidemia    takes Lipitor daily  . Joint swelling   . Nocturia   . Obesity hypoventilation syndrome ([place]   . Obstructive sleep apnea 02/2018   Notably improved split-night study with weight loss from 270 (2017) down to 250 pounds (2019).  . Pancreatitis    takes Pancrelipase daily  . Parkinson's disease ([place]    takes Sinemet daily  . Peripheral neuropathy   . Pneumonia 2012  . Seizures (  [place])    takes Lamictal daily and Primidone nightly;last seizure 2wks ago  . Urinary urgency    With increased frequency  . Varicose veins of both lower extremities with pain    With edema.  Takes daily Lasix    PAST  SURGICAL HISTORY: Past Surgical History:  Procedure Laterality Date  . ABDOMINAL HYSTERECTOMY    . APPENDECTOMY    . BACK SURGERY    . Cardiac Event Monitor  September-October 2017   Sinus rhythm with occasional PACs and artifact. No arrhythmias besides one short run of tachycardia.  . CHOLECYSTECTOMY    . COLONOSCOPY N/A 08/15/2012   Procedure: COLONOSCOPY;  Surgeon: Arta Silence, MD;  Location: [facility];  Service: Endoscopy;  Laterality: N/A;  . COLONOSCOPY WITH PROPOFOL Left 06/17/2017   Procedure: COLONOSCOPY WITH PROPOFOL;  Surgeon: [name], MD;  Location: [place];  Service: Gastroenterology;  Laterality: Left;  . CORONARY CALCIUM SCORE AND CTA  04/2018   Coronary calcium score 71.9.  Very large, hyperdynamic LAD wrapping apex giving rise to PDA.  Moderate LAD-diagonal and circumflex plaque -> CT FFR suggested positive findings in distal D1, D2 and distal circumflex.  Referred for cath. ==> FALSE POSITIVE  . ESOPHAGOGASTRODUODENOSCOPY    . eye cysts Bilateral   . LASIK    . LEFT HEART CATH AND CORONARY ANGIOGRAPHY N/A 05/16/2018   Procedure: LEFT HEART CATH AND CORONARY ANGIOGRAPHY;  Surgeon: Belva Crome, MD;  Location: [facility] INVASIVE CV LAB:  Angiographically normal coronary arteries with LVEDP of 21 mmHg.  Large draping hyperdominant LAD that wraps the apex and provides distal half of the PDA.  Relatively small caliber distal Cx -->proxLPDA, non-dom RCA. No Cx or Diag lesions -- FALSE + CT FFR  . LEFT HEART CATH AND CORONARY ANGIOGRAPHY  2009/March 2012   2009: (Dr. Alvie Heidelberg) Nonobstructive CAD; 2012: Minimal CAD --> false positive stress test  . NM MYOVIEW LTD  01/2016; 01/2018   a)LOW RISK. No ischemia or infarction;; b) EF 55-60%. NO ST changes.  No ischemia or Infarction. NO significant RV enlargement.  LOW RISK.  Marland Kitchen Polysomnogram  02/2018   (Dr. Brett Fairy from neurology): Split study was not adequately done due to low AHI.  She slept reclined and had an AHI less than  10, prolonged hypoxemia and no hypercapnia. -->  She has home oxygen already prescribed, but did not meet criteria for nightly oxygen.  (Was noted that the patient did not cooperate well with study.  Titration was discussed.  Marland Kitchen RECTAL POLYPECTOMY    . RIGHT/LEFT HEART CATH AND CORONARY ANGIOGRAPHY N/A 04/06/2019   Procedure: RIGHT/LEFT HEART CATH AND CORONARY ANGIOGRAPHY;  Surgeon: Leonie Man, MD;  [facility] INVASIVE CV LAB; angiographically normal coronary arteries.  Moderately elevated AD LVEDP..  Mild pulmonary hypertension: PA P 56/14 mmHg-mean 31 mmHg.  RAP 10 mmHg.  RV P-EDP 54/4 mmHg - 12 mmHg.  PCWP 11-15 mmHg.  CO-CI: 7.35-3.73.  . TONSILLECTOMY    . TRANSTHORACIC ECHOCARDIOGRAM  01/2016; 05/2017   A) Normal LV chamber size with mod LVH pattern. EF 50-55%. Severe LA dilation. Mod RA dilation. PAP elevated at 38 mmHg;; B) Mild concentric LVH.  EF 55-60% & no RWMA.  Grade 2 DD.  Diastolic flattening of the ventricular septum == ? elevated PAP.  Mild aortic valve calcification/sclerosis.  Mod LA dilation.  Mild RV dilation & Mod RA dilation  . TRANSTHORACIC ECHOCARDIOGRAM  10/2017; 01/2018   a) EF 55-60%.  No RWMA,  Moderate LA  dilation.  Mild LA dilation.  Peak PA pressures ~57 mmHg (moderate);;; b) EF 55-60%.  GRII DD.  Suggestion of RV volume overload with moderate RV dilation.  Severe LA and RA dilation.Marland Kitchen  PA pressure ~67%.  . TRANSTHORACIC ECHOCARDIOGRAM  01/2019   Normal LV size and function.  EF 50 to 55%.  Impaired relaxation.  RV appears to have moderately reduced function.  Severely enlarged.  Cannot fully assess RV pressures.  Hypermobile interatrial septum.  Right atrium severely dilated.  . TUBAL LIGATION    . vaginal cyst removed     several times    FAMILY HISTORY: Family History  Problem Relation Age of Onset  . Allergies Father   . Heart disease Father        before 4  . Heart failure Father   . Hypertension Father   . Hyperlipidemia Father   . Heart disease  Mother   . Diabetes Mother   . Hypertension Mother   . Hyperlipidemia Mother   . Hypertension Sister   . Heart disease Sister        before 42  . Hyperlipidemia Sister   . Diabetes Brother   . Hypertension Brother   . Diabetes Sister   . Hypertension Sister   . Diabetes Son   . Hypertension Son   . Cancer Other     SOCIAL HISTORY: Social History   Socioeconomic History  . Marital status: Divorced    Spouse name: Not on file  . Number of children: Not on file  . Years of education: Not on file  . Highest education level: Not on file  Occupational History  . Occupation: Disabled    Comment: CNA  Tobacco Use  . Smoking status: Former Smoker    Packs/day: 0.50    Years: 25.00    Pack years: 12.50    Types: Cigarettes  . Smokeless tobacco: Never Used  . Tobacco comment: quit smoking 20+ytrs ago  Substance and Sexual Activity  . Alcohol use: No  . Drug use: No  . Sexual activity: Not Currently    Birth control/protection: Surgical  Other Topics Concern  . Not on file  Social History Narrative   She lives in [place]. She has lots of family in the area and is accompanied by her sister.   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   She has a distant history of smoking, quit 20 years ago.   Social Determinants of Health   Financial Resource Strain: Low Risk   . Difficulty of Paying Living Expenses: Not hard at all  Food Insecurity: No Food Insecurity  . Worried About Charity fundraiser in the Last Year: Never true  . Ran Out of Food in the Last Year: Never true  Transportation Needs: No Transportation Needs  . Lack of Transportation (Medical): No  . Lack of Transportation (Non-Medical): No  Physical Activity: Insufficiently Active  . Days of Exercise per Week: 2 days  . Minutes of Exercise per Session: 30 min  Stress: No Stress Concern Present  . Feeling of Stress : Not at all  Social Connections:   . Frequency of Communication  with Friends and Family: Not on file  . Frequency of Social Gatherings with Friends and Family: Not on file  . Attends Religious Services: Not on file  . Active Member of Clubs or Organizations: Not on file  . Attends Archivist Meetings: Not on file  . Marital Status:  Not on file  Intimate Partner Violence: Not At Risk  . Fear of Current or Ex-Partner: No  . Emotionally Abused: No  . Physically Abused: No  . Sexually Abused: No      PHYSICAL EXAM  Vitals:   06/24/19 1013  BP: 127/67  Pulse: 63  Temp: 97.6 F (36.4 C)  TempSrc: Oral  Weight: 224 lb 3.2 oz (101.7 kg)  Height: 5' 1"  (1.549 m)   Body mass index is 42.36 kg/m.  Generalized: Well developed, in no acute distress  Cardiology: normal rate and rhythm, no murmur noted Respiratory, clear to auscultation bilaterally Neurological examination  Mentation: Alert oriented to time, place, history taking. Follows all commands speech and language fluent Cranial nerve II-XII: Pupils were equal round reactive to light. Extraocular movements were full, visual field were full  Motor: The motor testing reveals 5 over 5 strength of all 4 extremities. Good symmetric motor tone is noted throughout.  Gait and station: Gait is normal.   DIAGNOSTIC DATA (LABS, IMAGING, TESTING) - I reviewed patient records, labs, notes, testing and imaging myself where available.  No flowsheet data found.   Lab Results  Component Value Date   WBC 4.4 04/03/2019   HGB 11.2 (L) 04/06/2019   HCT 33.0 (L) 04/06/2019   MCV 74 (L) 04/03/2019   PLT 310 04/03/2019      Component Value Date/Time   NA 139 04/06/2019 1343   NA 138 04/03/2019 1433   K 3.6 04/06/2019 1343   CL 97 04/03/2019 1433   CO2 29 04/03/2019 1433   GLUCOSE 130 (H) 04/03/2019 1433   GLUCOSE 176 (H) 02/26/2019 0452   BUN 20 04/03/2019 1433   CREATININE 1.15 (H) 04/03/2019 1433   CREATININE 0.91 10/04/2016 1144   CALCIUM 9.7 04/03/2019 1433   PROT 6.8 02/25/2019  0332   PROT 7.4 10/22/2018 1446   ALBUMIN 3.3 (L) 02/25/2019 0332   ALBUMIN 4.3 10/22/2018 1446   AST 18 02/25/2019 0332   ALT 16 02/25/2019 0332   ALKPHOS 79 02/25/2019 0332   BILITOT 0.7 02/25/2019 0332   BILITOT 0.4 10/22/2018 1446   GFRNONAA 48 (L) 04/03/2019 1433   GFRNONAA 65 10/04/2016 1144   GFRAA 56 (L) 04/03/2019 1433   GFRAA 75 10/04/2016 1144   Lab Results  Component Value Date   CHOL 145 10/22/2018   HDL 70 10/22/2018   LDLCALC 65 10/22/2018   LDLDIRECT 66.0 01/27/2015   TRIG 52 10/22/2018   CHOLHDL 2.1 10/22/2018   Lab Results  Component Value Date   HGBA1C 7.5 (H) 05/19/2019   Lab Results  Component Value Date   VITAMINB12 1,376 (H) 06/17/2017   Lab Results  Component Value Date   TSH 1.332 02/25/2019    ASSESSMENT AND PLAN 73 y.o. year old female  has a past medical history of Abnormal liver function, Anemia, Arthritis, Bruises easily, Cataract, Chronic back pain, Chronic cough, Chronic kidney disease, COPD (chronic obstructive pulmonary disease) ([place]), Demand myocardial infarction ([place]) (2012), Depression, Diastolic heart failure (1478), Diverticulosis, DM (diabetes mellitus) ([place]), Empty sella ([place]), Glaucoma, Headache(784.0), History of blood transfusion, History of colon polyps, HTN (hypertension), Hyperlipidemia, Joint swelling, Nocturia, Obesity hypoventilation syndrome ([place]), Obstructive sleep apnea (02/2018), Pancreatitis, Parkinson's disease ([place]), Peripheral neuropathy, Pneumonia (2012), Seizures ([place]), Urinary urgency, and Varicose veins of both lower extremities with pain. here with     ICD-10-CM   1. OSA on CPAP  G47.33    Z99.89     Britanie is doing well on CPAP  therapy.  Compliance report reveals optimal compliance.  I have encouraged her to continue using CPAP nightly and for at least 4 hours every night.  She is followed regularly by Dr. Elsworth Soho with pulmonology for both CPAP management and hypoventilation syndrome.  I do not feel that she needs  to continue seeing our office as CPAP needs have been managed by Dr. Elsworth Soho.  She will continue to follow-up with Dr. Elsworth Soho very closely as directed.  She will also continue close follow-up with cardiology, Dr. Delice Lesch with neurology and Dr. Baird Cancer with primary care.  She is aware she may reach out to [place] as needed.  She verbalizes understanding and agreement with this plan.   No orders of the defined types were placed in this encounter.    No orders of the defined types were placed in this encounter.     I spent 15 minutes with the patient. 50% of this time was spent counseling and educating patient on plan of care and medications.    Debbora Presto, FNP-C 06/24/2019, 1:33 PM [facility] [address], [place], [place] [address] [phone]

## 2019-06-24 NOTE — Patient Instructions (Signed)
Continue CPAP nightly and greater than 4 hours each night   Follow up in 1 year, sooner if needed   Sleep Apnea Sleep apnea affects breathing during sleep. It causes breathing to stop for a short time or to become shallow. It can also increase the risk of:  Heart attack.  Stroke.  Being very overweight (obese).  Diabetes.  Heart failure.  Irregular heartbeat. The goal of treatment is to help you breathe normally again. What are the causes? There are three kinds of sleep apnea:  Obstructive sleep apnea. This is caused by a blocked or collapsed airway.  Central sleep apnea. This happens when the brain does not send the right signals to the muscles that control breathing.  Mixed sleep apnea. This is a combination of obstructive and central sleep apnea. The most common cause of this condition is a collapsed or blocked airway. This can happen if:  Your throat muscles are too relaxed.  Your tongue and tonsils are too large.  You are overweight.  Your airway is too small. What increases the risk?  Being overweight.  Smoking.  Having a small airway.  Being older.  Being female.  Drinking alcohol.  Taking medicines to calm yourself (sedatives or tranquilizers).  Having family members with the condition. What are the signs or symptoms?  Trouble staying asleep.  Being sleepy or tired during the day.  Getting angry a lot.  Loud snoring.  Headaches in the morning.  Not being able to focus your mind (concentrate).  Forgetting things.  Less interest in sex.  Mood swings.  Personality changes.  Feelings of sadness (depression).  Waking up a lot during the night to pee (urinate).  Dry mouth.  Sore throat. How is this diagnosed?  Your medical history.  A physical exam.  A test that is done when you are sleeping (sleep study). The test is most often done in a sleep lab but may also be done at home. How is this treated?   Sleeping on your  side.  Using a medicine to get rid of mucus in your nose (decongestant).  Avoiding the use of alcohol, medicines to help you relax, or certain pain medicines (narcotics).  Losing weight, if needed.  Changing your diet.  Not smoking.  Using a machine to open your airway while you sleep, such as: ? An oral appliance. This is a mouthpiece that shifts your lower jaw forward. ? A CPAP device. This device blows air through a mask when you breathe out (exhale). ? An EPAP device. This has valves that you put in each nostril. ? A BPAP device. This device blows air through a mask when you breathe in (inhale) and breathe out.  Having surgery if other treatments do not work. It is important to get treatment for sleep apnea. Without treatment, it can lead to:  High blood pressure.  Coronary artery disease.  In men, not being able to have an erection (impotence).  Reduced thinking ability. Follow these instructions at home: Lifestyle  Make changes that your doctor recommends.  Eat a healthy diet.  Lose weight if needed.  Avoid alcohol, medicines to help you relax, and some pain medicines.  Do not use any products that contain nicotine or tobacco, such as cigarettes, e-cigarettes, and chewing tobacco. If you need help quitting, ask your doctor. General instructions  Take over-the-counter and prescription medicines only as told by your doctor.  If you were given a machine to use while you sleep, use it only  as told by your doctor.  If you are having surgery, make sure to tell your doctor you have sleep apnea. You may need to bring your device with you.  Keep all follow-up visits as told by your doctor. This is important. Contact a doctor if:  The machine that you were given to use during sleep bothers you or does not seem to be working.  You do not get better.  You get worse. Get help right away if:  Your chest hurts.  You have trouble breathing in enough air.  You have  an uncomfortable feeling in your back, arms, or stomach.  You have trouble talking.  One side of your body feels weak.  A part of your face is hanging down. These symptoms may be an emergency. Do not wait to see if the symptoms will go away. Get medical help right away. Call your local emergency services (911 in the [place] Do not drive yourself to the hospital. Summary  This condition affects breathing during sleep.  The most common cause is a collapsed or blocked airway.  The goal of treatment is to help you breathe normally while you sleep. This information is not intended to replace advice given to you by your health care provider. Make sure you discuss any questions you have with your health care provider. Document Released: 03/20/2008 Document Revised: 03/28/2018 Document Reviewed: 02/04/2018 Elsevier Patient Education  2020 [employer] Inc[employer]  

## 2019-06-25 ENCOUNTER — Other Ambulatory Visit: Payer: Self-pay | Admitting: Internal Medicine

## 2019-06-25 ENCOUNTER — Other Ambulatory Visit: Payer: Self-pay

## 2019-06-25 DIAGNOSIS — E1122 Type 2 diabetes mellitus with diabetic chronic kidney disease: Secondary | ICD-10-CM

## 2019-06-25 MED ORDER — ACCU-CHEK SOFTCLIX LANCETS MISC: 3 refills | Status: DC

## 2019-06-25 MED ORDER — ALCOHOL WIPES 70 % EX MISC: 1.0000 | Freq: Two times a day (BID) | CUTANEOUS | 3 refills | Status: AC

## 2019-06-25 MED ORDER — ACCU-CHEK AVIVA PLUS VI STRP: ORAL_STRIP | 3 refills | Status: DC

## 2019-07-01 ENCOUNTER — Encounter: Payer: Self-pay | Admitting: Neurology

## 2019-07-05 DIAGNOSIS — G4733 Obstructive sleep apnea (adult) (pediatric): Secondary | ICD-10-CM | POA: Diagnosis not present

## 2019-07-06 ENCOUNTER — Telehealth: Payer: Self-pay | Admitting: Neurology

## 2019-07-06 ENCOUNTER — Ambulatory Visit (INDEPENDENT_AMBULATORY_CARE_PROVIDER_SITE_OTHER): Payer: Medicare HMO | Admitting: Pharmacist

## 2019-07-06 DIAGNOSIS — N182 Chronic kidney disease, stage 2 (mild): Secondary | ICD-10-CM | POA: Diagnosis not present

## 2019-07-06 DIAGNOSIS — Z794 Long term (current) use of insulin: Secondary | ICD-10-CM | POA: Diagnosis not present

## 2019-07-06 DIAGNOSIS — E1122 Type 2 diabetes mellitus with diabetic chronic kidney disease: Secondary | ICD-10-CM | POA: Diagnosis not present

## 2019-07-06 NOTE — Telephone Encounter (Signed)
Left message with the after hour service on 07-06-19 @ 12:55 pm  Caller states sister has been having seizures more often. She has not had any today  She had 2 back to back last week   Seems ok today

## 2019-07-06 NOTE — Progress Notes (Signed)
Chronic Care Management    Visit Note  07/06/2019 Name: [name] MRN: [id number] DOB: 20-Nov-1945  Referred by: [name], MD Reason for referral : Chronic care management (diabetes)   [name] is a 74 y.o. year old female who is a primary care patient of [name], MD. The CCM team was consulted for assistance with chronic disease management and care coordination needs related to DMII  Review of patient status, including review of consultants reports, relevant laboratory and other test results, and collaboration with appropriate care team members and the patient's provider was performed as part of comprehensive patient evaluation and provision of chronic care management services.     Medications: Outpatient Encounter Medications as of 07/06/2019  Medication Sig  . Accu-Chek Softclix Lancets lancets Use as instructed to check blood sugars 2 times per day dx: e11.22  . acetaminophen (TYLENOL) 500 MG tablet Take 500-1,000 mg by mouth every 4 (four) hours as needed for moderate pain.   Marland Kitchen albuterol (PROVENTIL HFA;VENTOLIN HFA) 108 (90 Base) MCG/ACT inhaler Inhale 1-2 puffs into the lungs every 6 (six) hours as needed for wheezing or shortness of breath (DOE WHEEZING SOB). (Patient taking differently: Inhale 1-2 puffs into the lungs every 6 (six) hours as needed for wheezing or shortness of breath. )  . atorvastatin (LIPITOR) 10 MG tablet TAKE 1 TABLET EVERY DAY (Patient taking differently: Take 10 mg by mouth every evening. )  . B Complex-C (B-COMPLEX WITH VITAMIN C) tablet Take 1 tablet by mouth daily.  . Blood Glucose Calibration (ACCU-CHEK AVIVA) SOLN Use as directed dx: e11.22  . Blood Glucose Monitoring Suppl (ACCU-CHEK AVIVA PLUS) w/Device KIT Use as directed to check blood sugars 2 times per day dx: e11.22  . carvedilol (COREG) 25 MG tablet Take 1 tablet (25 mg total) by mouth 2 (two) times daily with a meal.  . Cholecalciferol (VITAMIN D-3) 1000 units CAPS Take  1,000 Units by mouth daily.  . diclofenac sodium (VOLTAREN) 1 % GEL Apply 2 g topically 4 (four) times daily as needed (pain).  . DROPLET PEN NEEDLES 31G X 8 MM MISC USE DAILY WITH VICTOZA AND/OR NOVOLOG.   . furosemide (LASIX) 40 MG tablet Take one tablet (61m) by mouth on Saturday and Sunday only. (Patient taking differently: Take 40 mg by mouth See admin instructions. Take one tablet (445m by mouth on Saturday and Sunday only.)  . furosemide (LASIX) 80 MG tablet TAKE ONE TABLET BY MOUTH DAILY  MONDAY THRU FRIDAY  . glucose blood (ACCU-CHEK AVIVA PLUS) test strip Use as instructed to check blood sugars 2 times per day dx: e11.22  . hydrALAZINE (APRESOLINE) 100 MG tablet Take 1 tablet (100 mg total) by mouth 2 (two) times daily.  . hydrocortisone cream 1 % Apply 1 application topically daily as needed for itching.  . Insulin Glargine, 1 Unit Dial, (TOUJEO SOLOSTAR) 300 UNIT/ML SOPN Inject 36 Units into the skin at bedtime. (Patient taking differently: Inject 34 Units into the skin at bedtime. )  . insulin lispro (HUMALOG) 100 UNIT/ML injection Inject 2-4 Units into the skin 3 (three) times daily as needed (high blood sugar over 150).  . Isopropyl Alcohol (ALCOHOL WIPES) 70 % MISC Apply 1 each topically 2 (two) times daily.  . isosorbide mononitrate (IMDUR) 60 MG 24 hr tablet Take 1 tablet (60 mg total) by mouth daily.  . Marland KitchenamoTRIgine (LAMICTAL) 100 MG tablet Take 1 tablet twice a day (Take with 2543mwice a day to make a total  of 167m twice a day) (Patient taking differently: Take 100 mg by mouth See admin instructions. Take with 25 mg tablet to equal 125 mg twice daily)  . lamoTRIgine (LAMICTAL) 25 MG tablet Take 1 tablet twice a day (Take with 1013mtablet twice a day, to make it 12551mwice a day) (Patient taking differently: Take 25 mg by mouth See admin instructions. Take with 100 mg tablet to equal 125 mg twice daily)  . Menthol, Topical Analgesic, (STOPAIN ROLL-ON EX) Apply 1 application  topically daily as needed (pain).  . mMarland Kitchentolazone (ZAROXOLYN) 2.5 MG tablet Take 2.5 mg by mouth See admin instructions. Take 2.5 mg by mouth Monday through Friday as needed if weight is greater or equal to 269 lbs  . Multiple Vitamins-Minerals (MULTIVITAMIN WITH MINERALS) tablet Take 1 tablet by mouth daily.  . nMarland Kitchenomycin-bacitracin-polymyxin (NEOSPORIN) ointment Apply 1 application topically as needed for wound care.  . Pancrelipase, Lip-Prot-Amyl, (ZENPEP) 25000-79000 units CPEP Take 1-2 capsules by mouth See admin instructions. Take 2 capsules in the morning, 1 capsule with lunch, and 2 capsules in the evening.  . potassium chloride SA (K-DUR) 20 MEQ tablet TAKE 2 TABLETS ONCE DAILY, BUT ON THE DAY YOU  TAKE METOLAZONE TAKE AN EXTRA TABLET FOR A TOTAL OF 3 TABLETS (Patient taking differently: Take 40-60 mEq by mouth See admin instructions. Take 40 meq daily, when taking metolazone take a total of 60 meq daily)  . Semaglutide, 1 MG/DOSE, (OZEMPIC, 1 MG/DOSE,) 2 MG/1.5ML SOPN Inject 1 mg into the skin once a week. (Patient taking differently: Inject 1 mg into the skin every Thursday. )  . sertraline (ZOLOFT) 25 MG tablet Take 25 mg by mouth See admin instructions. Take with 50 mg to equal 75 mg at bedtime  . sertraline (ZOLOFT) 50 MG tablet TAKE 1 TABLET EVERY DAY  . Tolnaftate (TINACTIN EX) Apply 1 application topically daily as needed (foot fungus).   No facility-administered encounter medications on file as of 07/06/2019.     Objective:   Goals Addressed            This Visit's Progress     Patient Stated   . I would like to manage and optimize my medications for my chronic conditions (pt-stated)       Current Barriers:  . Diabetes: T2DM; most recent A1c 7.5% on 11/24 (was 8.5% on 02/24/19)  . Current antihyperglycemic regimen: Ozempic, Toujeo 34 units qHS o 07/06/2019-Accucheck Aviva glucometer-->test strips and supplies called in for 1 year ([facility] mail order) . denies hypoglycemic  symptoms; denies hyperglycemic symptoms . Current meal patterns: . Current exercise: n/a; motivated patient to participate in exercise as able and as recommended by PCP . Current blood glucose readings: FBG 110-130s.  This has improved! . Cardiovascular risk reduction: o Current hypertensive regimen: carvedilol, hydralazine, isosorbide o Current hyperlipidemia regimen:  atorvastatin 24m68mast filled #90 on 12/24/18) o Of note, patient is on lasix for CHF.  Reviewed new cardiology dosing with patient per chart. Her weights have gone from 206-->203,204.  Encouraged her to call cardiologist if needs arise.  She states her SOB/pain have decreased.  She has follow up on 03/31/19  Pharmacist Clinical Goal(s):  . OvMarland Kitchenr the next 90 days, patient with work with PharmD and primary care provider to address needs related to optimization of medication management of chronic conditions (diabetes).  Interventions: . Comprehensive medication review performed, medication list updated in electronic medical record.  Reviewed & discussed the following diabetes-related information with patient: o Reviewed  ADA recommended "diabetes-friendly" diet  (reviewed healthy snack/food options) o Discussed GLP-1 injection technique; Patient uses AccuCheck glucometer o Reviewed medication purpose/side effects-->patient denies adverse events  Patient Self Care Activities:  . Patient will check blood glucose daily , document, and provide at future appointments . Patient will focus on medication adherence by continuing to take medications as prescribed. . Patient will take medications as prescribed . Patient will contact provider with any episodes of hypoglycemia . Patient will report any questions or concerns to provider   Please see past updates related to this goal by clicking on the "Past Updates" button in the selected goal          Plan:   The care management team will reach out to the patient again over the next 60  days.   Patient reported increased seizure activity in the past 2-3 weeks.  She reports she has 24/7 caregivers.  Spoke with sister and instructed her to call Dr. Delice Lesch (neuro) concerning this matter ASAP.  Notified Dr. [name] of conversation.   Provider Signature [name], PharmD, BCPS Clinical Pharmacist, [facility] [place]: [phone]

## 2019-07-06 NOTE — Telephone Encounter (Signed)
Pt c/o: seizure Missed medications?   Sleep deprived?   Alcohol intake?   Back to their usual baseline self?  . If no, advise go to ER Current medications prescribed by Dr. Delice Lesch:   Left message to return call

## 2019-07-06 NOTE — Patient Instructions (Addendum)
Visit Information  Goals Addressed            This Visit's Progress     Patient Stated   . I would like to manage and optimize my medications for my chronic conditions (pt-stated)       Current Barriers:  . Diabetes: T2DM; most recent A1c 7.5% on 11/24 (was 8.5% on 02/24/19)  . Current antihyperglycemic regimen: Ozempic, Toujeo 34 units qHS o 07/06/2019-Accucheck Aviva glucometer-->test strips and supplies called in for 1 year ([facility] mail order) . denies hypoglycemic symptoms; denies hyperglycemic symptoms . Current meal patterns: . Current exercise: n/a; motivated patient to participate in exercise as able and as recommended by PCP . Current blood glucose readings: FBG 110-130s.  This has improved! . Cardiovascular risk reduction: o Current hypertensive regimen: carvedilol, hydralazine, isosorbide o Current hyperlipidemia regimen:  atorvastatin 10mg  (last filled #90 on 12/24/18) o Of note, patient is on lasix for CHF.  Reviewed new cardiology dosing with patient per chart. Her weights have gone from 206-->203,204.  Encouraged her to call cardiologist if needs arise.  She states her SOB/pain have decreased.  She has follow up on 03/31/19  Pharmacist Clinical Goal(s):  Marland Kitchen Over the next 90 days, patient with work with PharmD and primary care provider to address needs related to optimization of medication management of chronic conditions (diabetes).  Interventions: . Comprehensive medication review performed, medication list updated in electronic medical record.  Reviewed & discussed the following diabetes-related information with patient: o Reviewed ADA recommended "diabetes-friendly" diet  (reviewed healthy snack/food options) o Discussed GLP-1 injection technique; Patient uses AccuCheck glucometer o Reviewed medication purpose/side effects-->patient denies adverse events  Patient Self Care Activities:  . Patient will check blood glucose daily , document, and provide at future  appointments . Patient will focus on medication adherence by continuing to take medications as prescribed. . Patient will take medications as prescribed . Patient will contact provider with any episodes of hypoglycemia . Patient will report any questions or concerns to provider   Please see past updates related to this goal by clicking on the "Past Updates" button in the selected goal         The patient verbalized understanding of instructions provided today and declined a print copy of patient instruction materials.   The care management team will reach out to the patient again over the next 60 days.   SIGNATURE [name], PharmD, BCPS Clinical Pharmacist, [facility] [place]: [phone]

## 2019-07-09 NOTE — Telephone Encounter (Signed)
Pt has not returned called.  Left pt another message to call the office. Number left on her voicemail.

## 2019-07-10 ENCOUNTER — Telehealth: Payer: Self-pay

## 2019-07-10 NOTE — Telephone Encounter (Signed)
Patients sister returned call.  Pt has had at least 2 seizures in the past week. Pt is currently taking 125mg  of lamictal BID. She confirmed that she has not missed any doses. Pt denies alcohol or drug intake. No increased stressors. Diabetes is well controlled. Pt has lost 90 pounds in the past year.  Sister confirms that pt is speaking and thinking clearly. She did not hit her head during the seizures.

## 2019-07-10 NOTE — Telephone Encounter (Signed)
Pls have her increase the Lamictal to 150mg  twice a day. She has 100mg  tablets and 25mg  tablets. Take 100mg  tab with 2 of the 25mg  tabs (total 50mg ), pls send in new Rx, thanks!

## 2019-07-13 MED ORDER — LAMOTRIGINE 150 MG PO TABS: 150.0000 mg | ORAL_TABLET | Freq: Two times a day (BID) | ORAL | 3 refills | Status: DC

## 2019-07-13 NOTE — Addendum Note (Signed)
Addended by: Alphonzo Dublin on: 07/13/2019 11:00 AM   Modules accepted: Orders

## 2019-07-13 NOTE — Telephone Encounter (Signed)
Tried calling pt, no answer. Called pt's sister who is on DPR, no answer. Vmail left informing of dosage change of lamictal. Instructed to go ahead and increase Lamictal to 150mg  BID. Pt should take 1 100mg  tab and 2 of the 25mg  twice daily until the new prescription of 150mg  BID is delivered to her home addressed. Instructed to call back with concerns.

## 2019-07-20 ENCOUNTER — Ambulatory Visit: Payer: Medicare HMO | Admitting: Adult Health

## 2019-07-21 DIAGNOSIS — N6323 Unspecified lump in the left breast, lower outer quadrant: Secondary | ICD-10-CM | POA: Diagnosis not present

## 2019-07-21 DIAGNOSIS — R928 Other abnormal and inconclusive findings on diagnostic imaging of breast: Secondary | ICD-10-CM | POA: Diagnosis not present

## 2019-07-21 DIAGNOSIS — N6311 Unspecified lump in the right breast, upper outer quadrant: Secondary | ICD-10-CM | POA: Diagnosis not present

## 2019-07-22 ENCOUNTER — Encounter: Payer: Self-pay | Admitting: Internal Medicine

## 2019-07-23 ENCOUNTER — Ambulatory Visit (HOSPITAL_COMMUNITY)
Admission: RE | Admit: 2019-07-23 | Discharge: 2019-07-23 | Disposition: A | Discharge status: Home / Self Care | Payer: Medicare HMO | Source: Ambulatory Visit | Attending: Cardiology | Admitting: Cardiology

## 2019-07-23 ENCOUNTER — Other Ambulatory Visit: Payer: Self-pay

## 2019-07-23 ENCOUNTER — Encounter (HOSPITAL_COMMUNITY): Payer: Self-pay | Admitting: Cardiology

## 2019-07-23 VITALS — BP 125/49 | HR 63 | Wt 223.2 lb

## 2019-07-23 DIAGNOSIS — G40909 Epilepsy, unspecified, not intractable, without status epilepticus: Secondary | ICD-10-CM | POA: Insufficient documentation

## 2019-07-23 DIAGNOSIS — I110 Hypertensive heart disease with heart failure: Secondary | ICD-10-CM | POA: Diagnosis not present

## 2019-07-23 DIAGNOSIS — Z87891 Personal history of nicotine dependence: Secondary | ICD-10-CM | POA: Diagnosis not present

## 2019-07-23 DIAGNOSIS — R002 Palpitations: Secondary | ICD-10-CM | POA: Insufficient documentation

## 2019-07-23 DIAGNOSIS — I50810 Right heart failure, unspecified: Secondary | ICD-10-CM | POA: Insufficient documentation

## 2019-07-23 DIAGNOSIS — Z8673 Personal history of transient ischemic attack (TIA), and cerebral infarction without residual deficits: Secondary | ICD-10-CM | POA: Insufficient documentation

## 2019-07-23 DIAGNOSIS — Z833 Family history of diabetes mellitus: Secondary | ICD-10-CM | POA: Diagnosis not present

## 2019-07-23 DIAGNOSIS — E785 Hyperlipidemia, unspecified: Secondary | ICD-10-CM | POA: Diagnosis not present

## 2019-07-23 DIAGNOSIS — Z79899 Other long term (current) drug therapy: Secondary | ICD-10-CM | POA: Diagnosis not present

## 2019-07-23 DIAGNOSIS — Z6841 Body Mass Index (BMI) 40.0 and over, adult: Secondary | ICD-10-CM | POA: Insufficient documentation

## 2019-07-23 DIAGNOSIS — R42 Dizziness and giddiness: Secondary | ICD-10-CM | POA: Insufficient documentation

## 2019-07-23 DIAGNOSIS — Z8249 Family history of ischemic heart disease and other diseases of the circulatory system: Secondary | ICD-10-CM | POA: Diagnosis not present

## 2019-07-23 DIAGNOSIS — I5032 Chronic diastolic (congestive) heart failure: Secondary | ICD-10-CM | POA: Insufficient documentation

## 2019-07-23 DIAGNOSIS — E662 Morbid (severe) obesity with alveolar hypoventilation: Secondary | ICD-10-CM | POA: Diagnosis not present

## 2019-07-23 DIAGNOSIS — E119 Type 2 diabetes mellitus without complications: Secondary | ICD-10-CM | POA: Insufficient documentation

## 2019-07-23 DIAGNOSIS — Z794 Long term (current) use of insulin: Secondary | ICD-10-CM | POA: Diagnosis not present

## 2019-07-23 DIAGNOSIS — F329 Major depressive disorder, single episode, unspecified: Secondary | ICD-10-CM | POA: Insufficient documentation

## 2019-07-23 DIAGNOSIS — I272 Pulmonary hypertension, unspecified: Secondary | ICD-10-CM | POA: Insufficient documentation

## 2019-07-23 LAB — BASIC METABOLIC PANEL
Anion gap: 11 (ref 5–15)
BUN: 15 mg/dL (ref 8–23)
CO2: 27 mmol/L (ref 22–32)
Calcium: 9.3 mg/dL (ref 8.9–10.3)
Chloride: 100 mmol/L (ref 98–111)
Creatinine, Ser: 1.31 mg/dL — ABNORMAL HIGH (ref 0.44–1.00)
GFR calc Af Amer: 47 mL/min — ABNORMAL LOW (ref >60)
GFR calc non Af Amer: 41 mL/min — ABNORMAL LOW (ref >60)
Glucose, Bld: 129 mg/dL — ABNORMAL HIGH (ref 70–99)
Potassium: 3.7 mmol/L (ref 3.5–5.1)
Sodium: 138 mmol/L (ref 135–145)

## 2019-07-23 LAB — BRAIN NATRIURETIC PEPTIDE: B Natriuretic Peptide: 76.6 pg/mL (ref 0.0–100.0)

## 2019-07-23 NOTE — Patient Instructions (Signed)
Continue current medications  Lab work was done today, we will notify you for abnormal results  We will schedule you for a chest x-ray and VQ Scan  You need pulmonary function test once they start doing these for outpatients we will call you to schedule  You have been referred for a Cardiomems Device:  Please review the educational material we have provided to you  We will submit the application to your insurance company, this process can take 4-8 weeks  Once it is approved we will call you to schedule the procedure   Once it is implanted you can expect:   A weekly call from our office about your readings for the first 3 weeks after implant  After that we will just call you periodically    A follow up appointment with our office about 3 weeks after implant   A monthly bill from our office **If you have questions about this process or the device please Kevan Rosebush, RN at [phone]  Your physician recommends that you schedule a follow-up appointment in: 6 weeks  If you have any questions or concerns before your next appointment please send [place] a message through [place] or call our office at [phone].  At the [facility], you and your health needs are our priority. As part of our continuing mission to provide you with exceptional heart care, we have created designated Provider Care Teams. These Care Teams include your primary Cardiologist (physician) and Advanced Practice Providers (APPs- Physician Assistants and Nurse Practitioners) who all work together to provide you with the care you need, when you need it.   You may see any of the following providers on your designated Care Team at your next follow up: Marland Kitchen Dr Glori Bickers . Dr Loralie Champagne . Darrick Grinder, NP . Lyda Jester, PA . Audry Riles, PharmD   Please be sure to bring in all your medications bottles to every appointment.

## 2019-07-25 NOTE — Progress Notes (Addendum)
PCP: [name], MD Cardiology: Dr. Ellyn Hack HF Cardiology: Dr. [name]  74 y.o. with history of OHS/OSA and RV dysfunction was referred by Dr. Ellyn Hack for evaluation of CHF.  She has a history of OSA/OHS.  She uses CPAP at night but not currently using oxygen during the day.  Remote smoker.  She had an echo in 9/20 showing EF 50-55% but moderate-severe RV dysfunction with mild-moderate RV dilation.  RHC/LHC in 10/20 showed normal coronaries, pulmonary venous hypertension. She is short of breath walking longer distances.  She uses a cane and tries to walk for exercise in her backyard. She thinks she could walk a mile but would be short of breath. Weight has overall trended lower.  She is taking Lasix 80 mg daily but no metolazone recently.  She has occasional spells of dizziness/lightheadedness and palpitations. She wore an event monitor in 9/20 for this but no results are available. Finally, she has a seizure disorder and has had 2 seizures in the last month.   ECG (personally reviewed): NSR, RVH  Labs (10/20): K 4.1, creatinine 1.15, hgb 11.4  PMH: 1. OHS/OSA: She uses CPAP.  2. H/o CVA 3. Seizure disorder 4. Type 2 DM 5. Depression 6. HTN 7. Hyperlipidemia 8. Chronic diastolic CHF: Echo (3/22) with EF 50-55%, moderate to severe RV dilation with mild-moderately decreased RV systolic function.  - RHC/LHC (10/20): normal coronaries; mean RA 10, PA 56/14 mean 31, mean PCWP 13, LVEDP 18, CO/CI 7.35/3.73, PVR 2.44.  9. Primarily pulmonary venous hypertension.   Social History   Socioeconomic History  . Marital status: Divorced    Spouse name: Not on file  . Number of children: Not on file  . Years of education: Not on file  . Highest education level: Not on file  Occupational History  . Occupation: Disabled    Comment: CNA  Tobacco Use  . Smoking status: Former Smoker    Packs/day: 0.50    Years: 25.00    Pack years: 12.50    Types: Cigarettes  . Smokeless tobacco: Never Used   . Tobacco comment: quit smoking 20+ytrs ago  Substance and Sexual Activity  . Alcohol use: No  . Drug use: No  . Sexual activity: Not Currently    Birth control/protection: Surgical  Other Topics Concern  . Not on file  Social History Narrative   She lives in [place]. She has lots of family in the area and is accompanied by her sister.   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   She has a distant history of smoking, quit 20 years ago.   Social Determinants of Health   Financial Resource Strain: Low Risk   . Difficulty of Paying Living Expenses: Not hard at all  Food Insecurity: No Food Insecurity  . Worried About Charity fundraiser in the Last Year: Never true  . Ran Out of Food in the Last Year: Never true  Transportation Needs: No Transportation Needs  . Lack of Transportation (Medical): No  . Lack of Transportation (Non-Medical): No  Physical Activity: Insufficiently Active  . Days of Exercise per Week: 2 days  . Minutes of Exercise per Session: 30 min  Stress: No Stress Concern Present  . Feeling of Stress : Not at all  Social Connections:   . Frequency of Communication with Friends and Family: Not on file  . Frequency of Social Gatherings with Friends and Family: Not on file  . Attends Religious Services: Not on  file  . Active Member of Clubs or Organizations: Not on file  . Attends Archivist Meetings: Not on file  . Marital Status: Not on file  Intimate Partner Violence: Not At Risk  . Fear of Current or Ex-Partner: No  . Emotionally Abused: No  . Physically Abused: No  . Sexually Abused: No   Family History  Problem Relation Age of Onset  . Allergies Father   . Heart disease Father        before 6  . Heart failure Father   . Hypertension Father   . Hyperlipidemia Father   . Heart disease Mother   . Diabetes Mother   . Hypertension Mother   . Hyperlipidemia Mother   . Hypertension Sister   . Heart disease  Sister        before 81  . Hyperlipidemia Sister   . Diabetes Brother   . Hypertension Brother   . Diabetes Sister   . Hypertension Sister   . Diabetes Son   . Hypertension Son   . Cancer Other    Current Outpatient Medications  Medication Sig Dispense Refill  . Accu-Chek Softclix Lancets lancets Use as instructed to check blood sugars 2 times per day dx: e11.22 300 each 3  . acetaminophen (TYLENOL) 500 MG tablet Take 500-1,000 mg by mouth every 4 (four) hours as needed for moderate pain.     Marland Kitchen albuterol (VENTOLIN HFA) 108 (90 Base) MCG/ACT inhaler Inhale 1-2 puffs into the lungs every 6 (six) hours as needed for wheezing or shortness of breath.    Marland Kitchen atorvastatin (LIPITOR) 10 MG tablet Take 10 mg by mouth every evening.    . B Complex-C (B-COMPLEX WITH VITAMIN C) tablet Take 1 tablet by mouth daily.    . Blood Glucose Calibration (ACCU-CHEK AVIVA) SOLN Use as directed dx: e11.22 1 each 3  . Blood Glucose Monitoring Suppl (ACCU-CHEK AVIVA PLUS) w/Device KIT Use as directed to check blood sugars 2 times per day dx: e11.22 1 kit 3  . carvedilol (COREG) 25 MG tablet Take 1 tablet (25 mg total) by mouth 2 (two) times daily with a meal. 180 tablet 3  . Cholecalciferol (VITAMIN D-3) 1000 units CAPS Take 1,000 Units by mouth daily.    . diclofenac sodium (VOLTAREN) 1 % GEL Apply 2 g topically 4 (four) times daily as needed (pain).    . DROPLET PEN NEEDLES 31G X 8 MM MISC USE DAILY WITH VICTOZA AND/OR NOVOLOG.  400 each 1  . furosemide (LASIX) 40 MG tablet Take one tablet ('40mg'$ ) by mouth on Saturday and Sunday only.    . furosemide (LASIX) 80 MG tablet TAKE ONE TABLET BY MOUTH DAILY  MONDAY THRU FRIDAY 60 tablet 11  . glucose blood (ACCU-CHEK AVIVA PLUS) test strip Use as instructed to check blood sugars 2 times per day dx: e11.22 300 each 3  . hydrALAZINE (APRESOLINE) 100 MG tablet Take 1 tablet (100 mg total) by mouth 2 (two) times daily. 180 tablet 2  . hydrocortisone cream 1 % Apply 1  application topically daily as needed for itching.    . Insulin Glargine, 1 Unit Dial, (TOUJEO SOLOSTAR) 300 UNIT/ML SOPN Inject 36 Units into the skin at bedtime. (Patient taking differently: Inject 34 Units into the skin at bedtime. ) 15 pen 2  . insulin lispro (HUMALOG) 100 UNIT/ML injection Inject 2-4 Units into the skin 3 (three) times daily as needed (high blood sugar over 150).    . Isopropyl Alcohol (  ALCOHOL WIPES) 70 % MISC Apply 1 each topically 2 (two) times daily. 300 each 3  . isosorbide mononitrate (IMDUR) 60 MG 24 hr tablet Take 1 tablet (60 mg total) by mouth daily. 90 tablet 3  . lamoTRIgine (LAMICTAL) 150 MG tablet Take 1 tablet (150 mg total) by mouth 2 (two) times daily. 180 tablet 3  . Menthol, Topical Analgesic, (STOPAIN ROLL-ON EX) Apply 1 application topically daily as needed (pain).    Marland Kitchen metolazone (ZAROXOLYN) 2.5 MG tablet Take 2.5 mg by mouth See admin instructions. Take 2.5 mg by mouth Monday through Friday as needed if weight is greater or equal to 269 lbs    . Multiple Vitamins-Minerals (MULTIVITAMIN WITH MINERALS) tablet Take 1 tablet by mouth daily.    Marland Kitchen neomycin-bacitracin-polymyxin (NEOSPORIN) ointment Apply 1 application topically as needed for wound care.    . Pancrelipase, Lip-Prot-Amyl, (ZENPEP) 25000-79000 units CPEP Take 1-2 capsules by mouth See admin instructions. Take 2 capsules in the morning, 1 capsule with lunch, and 2 capsules in the evening.    . potassium chloride SA (K-DUR) 20 MEQ tablet TAKE 2 TABLETS ONCE DAILY, BUT ON THE DAY YOU  TAKE METOLAZONE TAKE AN EXTRA TABLET FOR A TOTAL OF 3 TABLETS (Patient taking differently: Take 40-60 mEq by mouth See admin instructions. Take 40 meq daily, when taking metolazone take a total of 60 meq daily) 204 tablet 0  . Semaglutide, 1 MG/DOSE, (OZEMPIC, 1 MG/DOSE,) 2 MG/1.5ML SOPN Inject 1 mg into the skin once a week. (Patient taking differently: Inject 1 mg into the skin every Thursday. ) 3 pen 1  . sertraline  (ZOLOFT) 25 MG tablet Take 25 mg by mouth See admin instructions. Take with 50 mg to equal 75 mg at bedtime    . sertraline (ZOLOFT) 50 MG tablet TAKE 1 TABLET EVERY DAY 90 tablet 2  . Tolnaftate (TINACTIN EX) Apply 1 application topically daily as needed (foot fungus).     No current facility-administered medications for this encounter.   BP (!) 125/49   Pulse 63   Wt 101.2 kg (223 lb 3.2 oz)   SpO2 98%   BMI 42.17 kg/m  General: NAD, obese Neck: No JVD, no thyromegaly or thyroid nodule.  Lungs: Clear to auscultation bilaterally with normal respiratory effort. CV: Nondisplaced PMI.  Heart regular S1/S2, no S3/S4, no murmur.  No peripheral edema.  No carotid bruit.  Normal pedal pulses.  Abdomen: Soft, nontender, no hepatosplenomegaly, no distention.  Skin: Intact without lesions or rashes.  Neurologic: Alert and oriented x 3.  Psych: Normal affect. Extremities: No clubbing or cyanosis.  HEENT: Normal.   Assessment/Plan: 1. Chronic diastolic CHF/RV failure: Echo in 9/20 showed EF 50-55% with moderate-severe RV dilation and mild-moderately decreased systolic function. RHC/LHC showed mild-moderate pulmonary venous hypertension and no significant coronary disease.  Cause of RV dysfunction is not clear, possibly due to diastolic CHF + OHS/OSA.  Today on exam, she is not volume overloaded.  - Continue Lasix 80 mg daily. BMET/BNP today.  I do not think that she needs metolazone.  - With significant RV dysfunction, I will arrange for V/Q scan to assess for chronic PEs and will also get PFTs.  - Would consider cardiac MRI in the future to assess for ARVC.  - I think that she would be a reasonable candidate for Cardiomems placement.  We discussed this today and she is interested.  I will see if her insurance will cover.  2. OHS/OSA: She is using CPAP but  not oxygen during the day.  Oxygen saturation not low at rest.  3. Pulmonary hypertension: RHC in 10/20 showed mild-moderate pulmonary venous  hypertension.  No role for selective pulmonary vasodilators.  4. Palpitations/lightheadedness: I will try to find the results of her 9/20 event monitor.   Followup in 6 wks.   Loralie Champagne 07/25/2019

## 2019-07-27 ENCOUNTER — Telehealth: Payer: Self-pay

## 2019-07-27 ENCOUNTER — Ambulatory Visit: Payer: Self-pay

## 2019-07-27 DIAGNOSIS — I5032 Chronic diastolic (congestive) heart failure: Secondary | ICD-10-CM

## 2019-07-27 DIAGNOSIS — Z794 Long term (current) use of insulin: Secondary | ICD-10-CM

## 2019-07-27 DIAGNOSIS — E1122 Type 2 diabetes mellitus with diabetic chronic kidney disease: Secondary | ICD-10-CM

## 2019-07-27 NOTE — Progress Notes (Deleted)
Cardiology Office Note   Date:  07/27/2019   ID:  [name], DOB 1946-05-15, MRN [id number]  PCP:  [name], MD  Cardiologist:  Dr. Ellyn Hack No chief complaint on file.    History of Present Illness: [name] is a 74 y.o. female who presents for ongoing assessment and management of pulmonary hypertension, normal coronaries by RHC/LHC, hyperlipidemia, chronic diastolic CHF, moderate to severe RV dilation, with moderately decreased RV function.Other history includes Type II diabetes, OSA on CPAP   Was seen by Dr. [name] in [facility], on 07/23/2019. At that time he planned to schedule her for VQ lung scan to assess for chronic PE, and also arranged for PFT's. Consideration for cardiac MRI in the future. She was also found to be "a reasonable candidate for Cardiomems " . PFT's are scheduled for 08/01/2019.     Past Medical History:  Diagnosis Date  . Abnormal liver function     in the past.  . Anemia   . Arthritis    all over  . Bruises easily   . Cataract    right eye;immature  . Chronic back pain    stenosis  . Chronic cough   . Chronic kidney disease   . COPD (chronic obstructive pulmonary disease) ([place])   . Demand myocardial infarction [facility]) 2012   Demand Infarction in setting of Pancreatitis --> mild Troponin elevation, NON-OBSTRUCTIVE CAD  . Depression    takes Abilify daily as well as Zoloft  . Diastolic heart failure 9935   Grade 1 diastolic Dysfunction by Echo   . Diverticulosis   . DM (diabetes mellitus) ([place])    takes Victoza daily as well as Lantus and Humalog  . Empty sella ([place])    on MRI in 2009.  . Glaucoma   . Headache(784.0)    last migraine-4-42yr ago  . History of blood transfusion    no abnormal reaction noted  . History of colon polyps    benign  . HTN (hypertension)    takes B[employer] Energydaily  . Hyperlipidemia    takes Lipitor daily  . Joint swelling   . Nocturia   . Ob[place] hypoventilation  syndrome ([place]   . Obstructive sleep apnea 02/2018   Notably improved split-night study with weight loss from 270 (2017) down to 250 pounds (2019).  . Pancreatitis    takes Pancrelipase daily  . Parkinson's disease ([place]    takes Sinemet daily  . Peripheral neuropathy   . Pneumonia 2012  . Seizures [place]    takes Lamictal daily and Primidone nightly;last seizure 2wks ago  . Urinary urgency    With increased frequency  . Varicose veins of both lower extremities with pain    With edema.  Takes daily Lasix    Past Surgical History:  Procedure Laterality Date  . ABDOMINAL HYSTERECTOMY    . APPENDECTOMY    . BACK SURGERY    . Cardiac Event Monitor  September-October 2017   Sinus rhythm with occasional PACs and artifact. No arrhythmias besides one short run of tachycardia.  . CHOLECYSTECTOMY    . COLONOSCOPY N/A 08/15/2012   Procedure: COLONOSCOPY;  Surgeon: WArta Silence MD;  Location: [facility];  Service: Endoscopy;  Laterality: N/A;  . COLONOSCOPY WITH PROPOFOL Left 06/17/2017   Procedure: COLONOSCOPY WITH PROPOFOL;  Surgeon: KRonnette Juniper MD;  [facility]: [facility]  Service: Gastroenterology;  Laterality: Left;  . CORONARY CALCIUM SCORE AND CTA  04/2018   Coronary calcium score 71.9.  Very large, hyperdynamic LAD wrapping apex giving rise to PDA.  Moderate LAD-diagonal and circumflex plaque -> CT FFR suggested positive findings in distal D1, D2 and distal circumflex.  Referred for cath. ==> FALSE POSITIVE  . ESOPHAGOGASTRODUODENOSCOPY    . eye cysts Bilateral   . LASIK    . LEFT HEART CATH AND CORONARY ANGIOGRAPHY N/A 05/16/2018   Procedure: LEFT HEART CATH AND CORONARY ANGIOGRAPHY;  Surgeon: Belva Crome, MD;  Location: [facility] INVASIVE CV LAB:  Angiographically normal coronary arteries with LVEDP of 21 mmHg.  Large draping hyperdominant LAD that wraps the apex and provides distal half of the PDA.  Relatively small caliber distal Cx -->proxLPDA, non-dom RCA. No Cx or Diag lesions  -- FALSE + CT FFR  . LEFT HEART CATH AND CORONARY ANGIOGRAPHY  2009/March 2012   2009: (Dr. Alvie Heidelberg) Nonobstructive CAD; 2012: Minimal CAD --> false positive stress test  . NM MYOVIEW LTD  01/2016; 01/2018   a)LOW RISK. No ischemia or infarction;; b) EF 55-60%. NO ST changes.  No ischemia or Infarction. NO significant RV enlargement.  LOW RISK.  Marland Kitchen Polysomnogram  02/2018   (Dr. Brett Fairy from neurology): Split study was not adequately done due to low AHI.  She slept reclined and had an AHI less than 10, prolonged hypoxemia and no hypercapnia. -->  She has home oxygen already prescribed, but did not meet criteria for nightly oxygen.  (Was noted that the patient did not cooperate well with study.  Titration was discussed.  Marland Kitchen RECTAL POLYPECTOMY    . RIGHT/LEFT HEART CATH AND CORONARY ANGIOGRAPHY N/A 04/06/2019   Procedure: RIGHT/LEFT HEART CATH AND CORONARY ANGIOGRAPHY;  Surgeon: Leonie Man, MD;  Location: [facility] INVASIVE CV LAB; angiographically normal coronary arteries.  Moderately elevated AD LVEDP..  Mild pulmonary hypertension: PA P 56/14 mmHg-mean 31 mmHg.  RAP 10 mmHg.  RV P-EDP 54/4 mmHg - 12 mmHg.  PCWP 11-15 mmHg.  CO-CI: 7.35-3.73.  . TONSILLECTOMY    . TRANSTHORACIC ECHOCARDIOGRAM  01/2016; 05/2017   A) Normal LV chamber size with mod LVH pattern. EF 50-55%. Severe LA dilation. Mod RA dilation. PAP elevated at 38 mmHg;; B) Mild concentric LVH.  EF 55-60% & no RWMA.  Grade 2 DD.  Diastolic flattening of the ventricular septum == ? elevated PAP.  Mild aortic valve calcification/sclerosis.  Mod LA dilation.  Mild RV dilation & Mod RA dilation  . TRANSTHORACIC ECHOCARDIOGRAM  10/2017; 01/2018   a) EF 55-60%.  No RWMA,  Moderate LA dilation.  Mild LA dilation.  Peak PA pressures ~57 mmHg (moderate);;; b) EF 55-60%.  GRII DD.  Suggestion of RV volume overload with moderate RV dilation.  Severe LA and RA dilation.Marland Kitchen  PA pressure ~67%.  . TRANSTHORACIC ECHOCARDIOGRAM  01/2019   Normal LV size and  function.  EF 50 to 55%.  Impaired relaxation.  RV appears to have moderately reduced function.  Severely enlarged.  Cannot fully assess RV pressures.  Hypermobile interatrial septum.  Right atrium severely dilated.  . TUBAL LIGATION    . vaginal cyst removed     several times     Current Outpatient Medications  Medication Sig Dispense Refill  . Accu-Chek Softclix Lancets lancets Use as instructed to check blood sugars 2 times per day dx: e11.22 300 each 3  . acetaminophen (TYLENOL) 500 MG tablet Take 500-1,000 mg by mouth every 4 (four) hours as needed for moderate pain.     Marland Kitchen albuterol (VENTOLIN HFA) 108 (90 Base) MCG/ACT inhaler  Inhale 1-2 puffs into the lungs every 6 (six) hours as needed for wheezing or shortness of breath.    Marland Kitchen atorvastatin (LIPITOR) 10 MG tablet Take 10 mg by mouth every evening.    . B Complex-C (B-COMPLEX WITH VITAMIN C) tablet Take 1 tablet by mouth daily.    . Blood Glucose Calibration (ACCU-CHEK AVIVA) SOLN Use as directed dx: e11.22 1 each 3  . Blood Glucose Monitoring Suppl (ACCU-CHEK AVIVA PLUS) w/Device KIT Use as directed to check blood sugars 2 times per day dx: e11.22 1 kit 3  . carvedilol (COREG) 25 MG tablet Take 1 tablet (25 mg total) by mouth 2 (two) times daily with a meal. 180 tablet 3  . Cholecalciferol (VITAMIN D-3) 1000 units CAPS Take 1,000 Units by mouth daily.    . diclofenac sodium (VOLTAREN) 1 % GEL Apply 2 g topically 4 (four) times daily as needed (pain).    . DROPLET PEN NEEDLES 31G X 8 MM MISC USE DAILY WITH VICTOZA AND/OR NOVOLOG.  400 each 1  . furosemide (LASIX) 40 MG tablet Take one tablet ('40mg'$ ) by mouth on Saturday and Sunday only.    . furosemide (LASIX) 80 MG tablet TAKE ONE TABLET BY MOUTH DAILY  MONDAY THRU FRIDAY 60 tablet 11  . glucose blood (ACCU-CHEK AVIVA PLUS) test strip Use as instructed to check blood sugars 2 times per day dx: e11.22 300 each 3  . hydrALAZINE (APRESOLINE) 100 MG tablet Take 1 tablet (100 mg total) by  mouth 2 (two) times daily. 180 tablet 2  . hydrocortisone cream 1 % Apply 1 application topically daily as needed for itching.    . Insulin Glargine, 1 Unit Dial, (TOUJEO SOLOSTAR) 300 UNIT/ML SOPN Inject 36 Units into the skin at bedtime. (Patient taking differently: Inject 34 Units into the skin at bedtime. ) 15 pen 2  . insulin lispro (HUMALOG) 100 UNIT/ML injection Inject 2-4 Units into the skin 3 (three) times daily as needed (high blood sugar over 150).    . Isopropyl Alcohol (ALCOHOL WIPES) 70 % MISC Apply 1 each topically 2 (two) times daily. 300 each 3  . isosorbide mononitrate (IMDUR) 60 MG 24 hr tablet Take 1 tablet (60 mg total) by mouth daily. 90 tablet 3  . lamoTRIgine (LAMICTAL) 150 MG tablet Take 1 tablet (150 mg total) by mouth 2 (two) times daily. 180 tablet 3  . Menthol, Topical Analgesic, (STOPAIN ROLL-ON EX) Apply 1 application topically daily as needed (pain).    Marland Kitchen metolazone (ZAROXOLYN) 2.5 MG tablet Take 2.5 mg by mouth See admin instructions. Take 2.5 mg by mouth Monday through Friday as needed if weight is greater or equal to 269 lbs    . Multiple Vitamins-Minerals (MULTIVITAMIN WITH MINERALS) tablet Take 1 tablet by mouth daily.    Marland Kitchen neomycin-bacitracin-polymyxin (NEOSPORIN) ointment Apply 1 application topically as needed for wound care.    . Pancrelipase, Lip-Prot-Amyl, (ZENPEP) 25000-79000 units CPEP Take 1-2 capsules by mouth See admin instructions. Take 2 capsules in the morning, 1 capsule with lunch, and 2 capsules in the evening.    . potassium chloride SA (K-DUR) 20 MEQ tablet TAKE 2 TABLETS ONCE DAILY, BUT ON THE DAY YOU  TAKE METOLAZONE TAKE AN EXTRA TABLET FOR A TOTAL OF 3 TABLETS (Patient taking differently: Take 40-60 mEq by mouth See admin instructions. Take 40 meq daily, when taking metolazone take a total of 60 meq daily) 204 tablet 0  . Semaglutide, 1 MG/DOSE, (OZEMPIC, 1 MG/DOSE,) 2 MG/1.5ML SOPN  Inject 1 mg into the skin once a week. (Patient taking  differently: Inject 1 mg into the skin every Thursday. ) 3 pen 1  . sertraline (ZOLOFT) 25 MG tablet Take 25 mg by mouth See admin instructions. Take with 50 mg to equal 75 mg at bedtime    . sertraline (ZOLOFT) 50 MG tablet TAKE 1 TABLET EVERY DAY 90 tablet 2  . Tolnaftate (TINACTIN EX) Apply 1 application topically daily as needed (foot fungus).     No current facility-administered medications for this visit.    Allergies:   Other, Penicillins, Sulfa antibiotics, Aspirin, and Codeine    Social History:  The patient  reports that she has quit smoking. Her smoking use included cigarettes. She has a 12.50 pack-year smoking history. She has never used smokeless tobacco. She reports that she does not drink alcohol or use drugs.   Family History:  The patient's family history includes Allergies in her father; Cancer in an other family member; Diabetes in her brother, mother, sister, and son; Heart disease in her father, mother, and sister; Heart failure in her father; Hyperlipidemia in her father, mother, and sister; Hypertension in her brother, father, mother, sister, sister, and son.    ROS: All other systems are reviewed and negative. Unless otherwise mentioned in H&P    PHYSICAL EXAM: VS:  There were no vitals taken for this visit. , BMI There is no height or weight on file to calculate BMI. GEN: Well nourished, well developed, in no acute distress HEENT: normal Neck: no JVD, carotid bruits, or masses Cardiac: ***RRR; no murmurs, rubs, or gallops,no edema  Respiratory:  Clear to auscultation bilaterally, normal work of breathing GI: soft, nontender, nondistended, + BS MS: no deformity or atrophy Skin: warm and dry, no rash Neuro:  Strength and sensation are intact Psych: euthymic mood, full affect   EKG:  EKG {ACTION; IS/IS [id number]} ordered today. The ekg ordered today demonstrates ***   Recent Labs: 02/25/2019: ALT 16; Magnesium 1.9; TSH 1.332 04/03/2019: Platelets  310 04/06/2019: Hemoglobin 11.2 07/23/2019: B Natriuretic Peptide 76.6; BUN 15; Creatinine, Ser 1.31; Potassium 3.7; Sodium 138    Lipid Panel    Component Value Date/Time   CHOL 145 10/22/2018 1446   TRIG 52 10/22/2018 1446   HDL 70 10/22/2018 1446   CHOLHDL 2.1 10/22/2018 1446   CHOLHDL 2.3 05/15/2018 0600   VLDL 24 05/15/2018 0600   LDLCALC 65 10/22/2018 1446   LDLDIRECT 66.0 01/27/2015 1104      Wt Readings from Last 3 Encounters:  07/23/19 223 lb 3.2 oz (101.2 kg)  06/24/19 224 lb 3.2 oz (101.7 kg)  05/19/19 221 lb 3.2 oz (100.3 kg)      Other studies Reviewed: RHC/LHC 04/06/2019   Hemodynamic findings consistent with mild pulmonary hypertension -with moderately elevated LVEDP  There is no aortic valve stenosis.  Angiographically normal coronary arteries with LEFT-LAD dominant system   SUMMARY  Angiographically normal coronary arteries with essentially LAD dominant system.  Right Heart Cath pressures show mild-moderate pulmonary hypertension with elevated LVEDP suggesting combination of diastolic dysfunction related as well as obesity hypoventilation related pulmonary hypertension  Normal/hyperdynamic cardiac output and index.   RECOMMENDATIONS  Discharge home after bedrest.  For now continue current medications as she seems to be adequately diuresed.  We will need to avoid overdiuresis in the future. -->  Shorter courses of increased Lasix for worsening edema or dyspnea.  Will review with advanced heart failure team.  Echocardiogram 02/25/2019 1. The left  ventricle has low normal systolic function, with an ejection  fraction of 50-55%. The cavity size was normal. Left ventricular diastolic  Doppler parameters are consistent with impaired relaxation. No evidence of  left ventricular regional wall  motion abnormalities.  2. Although TAPSE is measured as normal, the right ventricle visually  appears to have moderate to severely reduced systolic  function. The cavity  was severely enlarged. There is no increase in right ventricular wall  thickness. Right ventricular systolic  pressure could not be assessed.  3. Left atrial size was mildly dilated.  4. Hypermobile interatrial septum.  5. Right atrial size was severely dilated.  6. The aorta is normal unless otherwise noted.  7. The inferior vena cava was dilated in size with <50% respiratory  variability.    ASSESSMENT AND PLAN:  1.  ***   Current medicines are reviewed at length with the patient today.    Labs/ tests ordered today include: *** Phill Myron. West Pugh, ANP, [facility]   07/27/2019 8:39 AM    [facility] [place] [address] Office [phone] Fax [phone]  Notice: This dictation was prepared with Dragon dictation along with smaller phrase technology. Any transcriptional errors that result from this process are unintentional and may not be corrected upon review.

## 2019-07-28 ENCOUNTER — Telehealth: Payer: Self-pay

## 2019-07-28 ENCOUNTER — Ambulatory Visit: Payer: Medicare HMO | Admitting: Adult Health

## 2019-07-28 NOTE — Chronic Care Management (AMB) (Signed)
  Chronic Care Management   Outreach Note  07/28/2019 Name: [name] MRN: [id number] DOB: 11-Mar-1946  Referred by: [name], MD Reason for referral : Chronic Care Management (CCM RNCM Telephone Outreach )   An unsuccessful telephone outreach was attempted today. The patient was referred to the case management team by [name] MD for assistance with care management and care coordination.   Follow Up Plan: Telephone follow up appointment with care management team member scheduled for: 08/18/19  Barb Merino, RN, BSN, CCM Care Management Coordinator [place] [facility]  Direct Phone: [phone]

## 2019-07-31 ENCOUNTER — Other Ambulatory Visit (HOSPITAL_COMMUNITY): Payer: Self-pay | Admitting: Cardiology

## 2019-07-31 ENCOUNTER — Encounter (HOSPITAL_COMMUNITY)
Admission: RE | Admit: 2019-07-31 | Discharge: 2019-07-31 | Disposition: A | Discharge status: Home / Self Care | Payer: Medicare HMO | Source: Ambulatory Visit | Attending: Cardiology | Admitting: Cardiology

## 2019-07-31 ENCOUNTER — Ambulatory Visit (HOSPITAL_COMMUNITY)
Admission: RE | Admit: 2019-07-31 | Discharge: 2019-07-31 | Disposition: A | Discharge status: Home / Self Care | Payer: Medicare HMO | Source: Ambulatory Visit | Attending: Cardiology | Admitting: Cardiology

## 2019-07-31 ENCOUNTER — Other Ambulatory Visit: Payer: Self-pay

## 2019-07-31 DIAGNOSIS — I272 Pulmonary hypertension, unspecified: Secondary | ICD-10-CM | POA: Insufficient documentation

## 2019-07-31 DIAGNOSIS — R0602 Shortness of breath: Secondary | ICD-10-CM | POA: Diagnosis not present

## 2019-07-31 DIAGNOSIS — R079 Chest pain, unspecified: Secondary | ICD-10-CM | POA: Diagnosis not present

## 2019-07-31 DIAGNOSIS — Z0389 Encounter for observation for other suspected diseases and conditions ruled out: Secondary | ICD-10-CM | POA: Diagnosis not present

## 2019-07-31 MED ORDER — TECHNETIUM TO 99M ALBUMIN AGGREGATED
1.6400 | Freq: Once | INTRAVENOUS | Status: AC | PRN
  Administered 2019-07-31: 1.64 via INTRAVENOUS

## 2019-08-01 ENCOUNTER — Encounter (HOSPITAL_COMMUNITY): Payer: Self-pay | Admitting: Emergency Medicine

## 2019-08-01 ENCOUNTER — Other Ambulatory Visit: Payer: Self-pay

## 2019-08-01 ENCOUNTER — Emergency Department (HOSPITAL_COMMUNITY): Payer: Medicare HMO

## 2019-08-01 ENCOUNTER — Emergency Department (HOSPITAL_COMMUNITY)
Admission: EM | Admit: 2019-08-01 | Discharge: 2019-08-01 | Disposition: A | Discharge status: Home / Self Care | Payer: Medicare HMO | Attending: Emergency Medicine | Admitting: Emergency Medicine

## 2019-08-01 DIAGNOSIS — I440 Atrioventricular block, first degree: Secondary | ICD-10-CM | POA: Diagnosis not present

## 2019-08-01 DIAGNOSIS — I5030 Unspecified diastolic (congestive) heart failure: Secondary | ICD-10-CM | POA: Insufficient documentation

## 2019-08-01 DIAGNOSIS — I4510 Unspecified right bundle-branch block: Secondary | ICD-10-CM | POA: Diagnosis not present

## 2019-08-01 DIAGNOSIS — Z794 Long term (current) use of insulin: Secondary | ICD-10-CM | POA: Diagnosis not present

## 2019-08-01 DIAGNOSIS — Z79899 Other long term (current) drug therapy: Secondary | ICD-10-CM | POA: Diagnosis not present

## 2019-08-01 DIAGNOSIS — G40909 Epilepsy, unspecified, not intractable, without status epilepticus: Secondary | ICD-10-CM | POA: Diagnosis not present

## 2019-08-01 DIAGNOSIS — R112 Nausea with vomiting, unspecified: Secondary | ICD-10-CM | POA: Insufficient documentation

## 2019-08-01 DIAGNOSIS — I491 Atrial premature depolarization: Secondary | ICD-10-CM | POA: Diagnosis not present

## 2019-08-01 DIAGNOSIS — R05 Cough: Secondary | ICD-10-CM | POA: Insufficient documentation

## 2019-08-01 DIAGNOSIS — R569 Unspecified convulsions: Secondary | ICD-10-CM

## 2019-08-01 DIAGNOSIS — I499 Cardiac arrhythmia, unspecified: Secondary | ICD-10-CM | POA: Diagnosis not present

## 2019-08-01 DIAGNOSIS — E119 Type 2 diabetes mellitus without complications: Secondary | ICD-10-CM | POA: Insufficient documentation

## 2019-08-01 DIAGNOSIS — I110 Hypertensive heart disease with heart failure: Secondary | ICD-10-CM | POA: Insufficient documentation

## 2019-08-01 DIAGNOSIS — R0789 Other chest pain: Secondary | ICD-10-CM

## 2019-08-01 DIAGNOSIS — J9811 Atelectasis: Secondary | ICD-10-CM | POA: Diagnosis not present

## 2019-08-01 LAB — URINALYSIS, ROUTINE W REFLEX MICROSCOPIC
Bilirubin Urine: NEGATIVE
Glucose, UA: NEGATIVE mg/dL
Hgb urine dipstick: NEGATIVE
Ketones, ur: NEGATIVE mg/dL
Leukocytes,Ua: NEGATIVE
Nitrite: NEGATIVE
Protein, ur: NEGATIVE mg/dL
Specific Gravity, Urine: 1.004 — ABNORMAL LOW (ref 1.005–1.030)
pH: 7 (ref 5.0–8.0)

## 2019-08-01 LAB — BASIC METABOLIC PANEL
Anion gap: 13 (ref 5–15)
BUN: 17 mg/dL (ref 8–23)
CO2: 28 mmol/L (ref 22–32)
Calcium: 9.3 mg/dL (ref 8.9–10.3)
Chloride: 97 mmol/L — ABNORMAL LOW (ref 98–111)
Creatinine, Ser: 0.92 mg/dL (ref 0.44–1.00)
GFR calc Af Amer: >60 mL/min (ref >60)
GFR calc non Af Amer: >60 mL/min (ref >60)
Glucose, Bld: 71 mg/dL (ref 70–99)
Potassium: 3.4 mmol/L — ABNORMAL LOW (ref 3.5–5.1)
Sodium: 138 mmol/L (ref 135–145)

## 2019-08-01 LAB — CBG MONITORING, ED
Glucose-Capillary: 75 mg/dL (ref 70–99)
Glucose-Capillary: 82 mg/dL (ref 70–99)

## 2019-08-01 LAB — CBC
HCT: 38.6 % (ref 36.0–46.0)
Hemoglobin: 11.8 g/dL — ABNORMAL LOW (ref 12.0–15.0)
MCH: 22.7 pg — ABNORMAL LOW (ref 26.0–34.0)
MCHC: 30.6 g/dL (ref 30.0–36.0)
MCV: 74.4 fL — ABNORMAL LOW (ref 80.0–100.0)
Platelets: 284 10*3/uL (ref 150–400)
RBC: 5.19 MIL/uL — ABNORMAL HIGH (ref 3.87–5.11)
RDW: 16.7 % — ABNORMAL HIGH (ref 11.5–15.5)
WBC: 5 10*3/uL (ref 4.0–10.5)
nRBC: 0 % (ref 0.0–0.2)

## 2019-08-01 LAB — TROPONIN I (HIGH SENSITIVITY)
Troponin I (High Sensitivity): 7 ng/L (ref <18)
Troponin I (High Sensitivity): 9 ng/L (ref <18)

## 2019-08-01 LAB — MAGNESIUM: Magnesium: 1.8 mg/dL (ref 1.7–2.4)

## 2019-08-01 MED ORDER — POTASSIUM CHLORIDE CRYS ER 20 MEQ PO TBCR
20.0000 meq | EXTENDED_RELEASE_TABLET | Freq: Once | ORAL | Status: AC
  Administered 2019-08-01: 20 meq via ORAL
  Filled 2019-08-01: qty 1

## 2019-08-01 MED ORDER — SODIUM CHLORIDE 0.9% FLUSH: 3.0000 mL | Freq: Once | INTRAVENOUS | Status: DC

## 2019-08-01 MED ORDER — LEVETIRACETAM 500 MG PO TABS
500.0000 mg | ORAL_TABLET | Freq: Once | ORAL | Status: AC
  Administered 2019-08-01: 500 mg via ORAL
  Filled 2019-08-01: qty 1

## 2019-08-01 MED ORDER — LEVETIRACETAM 500 MG PO TABS: 500.0000 mg | ORAL_TABLET | Freq: Two times a day (BID) | ORAL | 0 refills | Status: DC

## 2019-08-01 NOTE — ED Triage Notes (Signed)
Pt arrvies from home via [facility] s/p seizure lasting reported 7 minutes.  Pt reports CP, 6/10, resolved at this time.  EMS reports pt was post-ictal in home;  AOx4 at baseline on arrival. Pt c/o intermittent n/v, denies CP at this time.

## 2019-08-01 NOTE — Discharge Instructions (Addendum)
You were seen in the emergency department for evaluation of seizure and some chest pain.  You had blood work EKG and a chest x-ray that did not show any serious findings.  Your urine did not show any signs of urinary tract infection.  We reviewed your work-up with neurology and they are recommending starting on Keppra twice a day in addition to your Lamictal.  Please contact your neurologist for close follow-up.  We also sent a Lamictal level which is not resulted at the time of discharge and your neurologist will need to follow-up on that.  Return to the emergency department if any concerning symptoms.

## 2019-08-01 NOTE — ED Provider Notes (Signed)
[facility] EMERGENCY DEPARTMENT Provider Note   CSN: [id number] Arrival date & time: 08/01/19  1104     History Chief Complaint  Patient presents with  . Seizures  . Chest Pain    [name] is a 74 y.o. female.  History is obtained from the patient and the patient's sister who she lives with.  She has a history of seizure disorder.  It sounds like she has been seeing her cardiologist and they have been adjusting her fluid medication.  Over the past month the sister says the patient's had more seizures.  Last month she had 2 back-to-back seizures.  Neurology was called and they recommended increasing her Lamictal.  She had possibly a brief seizure last night.  Again had a seizure this morning preceded by some nausea.  Per the sister's report it lasted about 7 minutes and was tonic-clonic mostly upper body.  Patient has no recollection of this since she said she just remembers the ambulance people being there.  She has been having of a months worth of on and off chest tightness that lasts about a minute at a time.  No recent fevers.  No vomiting or diarrhea.  No urinary symptoms.  She has a cough in the morning when she comes off the CPAP but otherwise does not have any pulmonary symptoms.  The history is provided by the patient and a relative.  Seizures Seizure activity on arrival: no   Seizure type:  Grand mal Preceding symptoms: nausea   Initial focality:  Unable to specify Episode characteristics: abnormal movements, generalized shaking and unresponsiveness   Return to baseline: yes   Severity:  Moderate Duration:  7 minutes Timing:  Once Progression:  Resolved Context: change in medication   Context: not alcohol withdrawal, not drug use, not fever, not intracranial shunt and medical compliance   Recent head injury:  No recent head injuries PTA treatment:  None History of seizures: yes   Similar to previous episodes: yes   Current therapy:   Lamotrigine Compliance with current therapy:  Good Chest Pain Pain location:  Substernal area Pain quality: pressure   Pain radiates to:  Does not radiate Pain severity:  Mild Onset quality:  Sudden Duration:  1 minute Timing:  Intermittent Progression:  Unchanged Chronicity:  New Context: at rest   Relieved by:  None tried Worsened by:  Nothing Ineffective treatments:  None tried Associated symptoms: back pain (chronic), cough, nausea and vomiting   Associated symptoms: no abdominal pain, no diaphoresis, no fever, no headache and no shortness of breath        Past Medical History:  Diagnosis Date  . Abnormal liver function     in the past.  . Anemia   . Arthritis    all over  . Bruises easily   . Cataract    right eye;immature  . Chronic back pain    stenosis  . Chronic cough   . Chronic kidney disease   . COPD (chronic obstructive pulmonary disease) ([place])   . Demand myocardial infarction [facility]) 2012   Demand Infarction in setting of Pancreatitis --> mild Troponin elevation, NON-OBSTRUCTIVE CAD  . Depression    takes Abilify daily as well as Zoloft  . Diastolic heart failure 8453   Grade 1 diastolic Dysfunction by Echo   . Diverticulosis   . DM (diabetes mellitus) ([place])    takes Victoza daily as well as Lantus and Humalog  . Empty sella ([place])  on MRI in 2009.  . Glaucoma   . Headache(784.0)    last migraine-4-22yr ago  . History of blood transfusion    no abnormal reaction noted  . History of colon polyps    benign  . HTN (hypertension)    takes BKinder Morgan Energydaily  . Hyperlipidemia    takes Lipitor daily  . Joint swelling   . Nocturia   . Obesity hypoventilation syndrome ([place]   . Obstructive sleep apnea 02/2018   Notably improved split-night study with weight loss from 270 (2017) down to 250 pounds (2019).  . Pancreatitis    takes Pancrelipase daily  . Parkinson's disease ([place]    takes Sinemet daily  . Peripheral neuropathy   .  Pneumonia 2012  . Seizures ([place]    takes Lamictal daily and Primidone nightly;last seizure 2wks ago  . Urinary urgency    With increased frequency  . Varicose veins of both lower extremities with pain    With edema.  Takes daily Lasix    Patient Active Problem List   Diagnosis Date Noted  . Class 3 severe obesity due to excess calories with serious comorbidity and body mass index (BMI) of 40.0 to 44.9 in adult ([facility] 05/22/2019  . Pulmonary hypertension, unspecified ([place] 04/06/2019  . Syncope 02/24/2019  . Rheumatoid arthritis ([place] 01/21/2019  . Type 2 diabetes mellitus with stage 2 chronic kidney disease, with long-term current use of insulin ([place] 07/21/2018  . Chest pain 05/15/2018  . 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 Excessive daytime sleepiness 01/30/2018  . Sleep-related hypoventilation 01/30/2018  . Recurrent depression ([place] 11/06/2017  . OSA on CPAP 10/07/2017  . Todd's paralysis ([facility]   . Chronic diastolic CHF (congestive heart failure), NYHA class 2 ([facility]   . Chronic respiratory failure with hypoxia ([place]   . Acute lower GI bleeding   . Stroke-like symptoms 06/15/2017  . Rectal bleeding 06/15/2017  . Dehydration 03/07/2017  . Medication management 12/24/2016  . Pre-syncope 03/09/2016  . Racing heart beat 03/09/2016  . Spinal stenosis at L4-L5 level 10/04/2014  . Lower abdominal pain 10/31/2012  . Type II diabetes mellitus, uncontrolled ([place] 10/31/2012  . Seizure disorder ([place] 10/31/2012  . Diverticulosis 10/31/2012  . Anemia associated with acute blood loss 08/14/2012  . Hematochezia 08/14/2012  . Varicose veins of bilateral lower extremities with other complications [id number] . Leg pain 03/21/2012  . Radicular pain of left lower extremity 03/21/2012  . Infected pseudocyst of pancreas 10/28/2011  . Chronic pancreatitis ([place] 10/28/2011  . Hypertensive heart disease with chronic diastolic  congestive heart failure ([place] 10/28/2011  . Dyslipidemia, goal LDL below 100 10/28/2011  . Morbid obesity with BMI of 45.0-49.9, adult ([place] 10/28/2011  . Peripheral edema 04/14/2011  . Obesity hypoventilation syndrome ([place] 10/26/2010  . Atherosclerotic heart disease of native coronary artery with other forms of angina pectoris ([facility] 08/28/2010    Past Surgical History:  Procedure Laterality Date  . ABDOMINAL HYSTERECTOMY    . APPENDECTOMY    . BACK SURGERY    . Cardiac Event Monitor  September-October 2017   Sinus rhythm with occasional PACs and artifact. No arrhythmias besides one short run of tachycardia.  . CHOLECYSTECTOMY    . COLONOSCOPY N/A 08/15/2012   Procedure: COLONOSCOPY;  Surgeon: WArta Silence MD;  Location: [facility] ENDOSCOPY;  Service: Endoscopy;  Laterality: N/A;  . COLONOSCOPY WITH PROPOFOL Left 06/17/2017   Procedure: COLONOSCOPY WITH PROPOFOL;  Surgeon:  [name], MD;  Location: [facility] ENDOSCOPY;  Service: Gastroenterology;  Laterality: Left;  . CORONARY CALCIUM SCORE AND CTA  04/2018   Coronary calcium score 71.9.  Very large, hyperdynamic LAD wrapping apex giving rise to PDA.  Moderate LAD-diagonal and circumflex plaque -> CT FFR suggested positive findings in distal D1, D2 and distal circumflex.  Referred for cath. ==> FALSE POSITIVE  . ESOPHAGOGASTRODUODENOSCOPY    . eye cysts Bilateral   . LASIK    . LEFT HEART CATH AND CORONARY ANGIOGRAPHY N/A 05/16/2018   Procedure: LEFT HEART CATH AND CORONARY ANGIOGRAPHY;  Surgeon: Belva Crome, MD;  Location: [facility] INVASIVE CV LAB:  Angiographically normal coronary arteries with LVEDP of 21 mmHg.  Large draping hyperdominant LAD that wraps the apex and provides distal half of the PDA.  Relatively small caliber distal Cx -->proxLPDA, non-dom RCA. No Cx or Diag lesions -- FALSE + CT FFR  . LEFT HEART CATH AND CORONARY ANGIOGRAPHY  2009/March 2012   2009: (Dr. Alvie Heidelberg) Nonobstructive CAD; 2012: Minimal CAD --> false positive stress  test  . NM MYOVIEW LTD  01/2016; 01/2018   a)LOW RISK. No ischemia or infarction;; b) EF 55-60%. NO ST changes.  No ischemia or Infarction. NO significant RV enlargement.  LOW RISK.  Marland Kitchen Polysomnogram  02/2018   (Dr. Brett Fairy from neurology): Split study was not adequately done due to low AHI.  She slept reclined and had an AHI less than 10, prolonged hypoxemia and no hypercapnia. -->  She has home oxygen already prescribed, but did not meet criteria for nightly oxygen.  (Was noted that the patient did not cooperate well with study.  Titration was discussed.  Marland Kitchen RECTAL POLYPECTOMY    . RIGHT/LEFT HEART CATH AND CORONARY ANGIOGRAPHY N/A 04/06/2019   Procedure: RIGHT/LEFT HEART CATH AND CORONARY ANGIOGRAPHY;  Surgeon: Leonie Man, MD;  Location: [facility] INVASIVE CV LAB; angiographically normal coronary arteries.  Moderately elevated AD LVEDP..  Mild pulmonary hypertension: PA P 56/14 mmHg-mean 31 mmHg.  RAP 10 mmHg.  RV P-EDP 54/4 mmHg - 12 mmHg.  PCWP 11-15 mmHg.  CO-CI: 7.35-3.73.  . TONSILLECTOMY    . TRANSTHORACIC ECHOCARDIOGRAM  01/2016; 05/2017   A) Normal LV chamber size with mod LVH pattern. EF 50-55%. Severe LA dilation. Mod RA dilation. PAP elevated at 38 mmHg;; B) Mild concentric LVH.  EF 55-60% & no RWMA.  Grade 2 DD.  Diastolic flattening of the ventricular septum == ? elevated PAP.  Mild aortic valve calcification/sclerosis.  Mod LA dilation.  Mild RV dilation & Mod RA dilation  . TRANSTHORACIC ECHOCARDIOGRAM  10/2017; 01/2018   a) EF 55-60%.  No RWMA,  Moderate LA dilation.  Mild LA dilation.  Peak PA pressures ~57 mmHg (moderate);;; b) EF 55-60%.  GRII DD.  Suggestion of RV volume overload with moderate RV dilation.  Severe LA and RA dilation.Marland Kitchen  PA pressure ~67%.  . TRANSTHORACIC ECHOCARDIOGRAM  01/2019   Normal LV size and function.  EF 50 to 55%.  Impaired relaxation.  RV appears to have moderately reduced function.  Severely enlarged.  Cannot fully assess RV pressures.  Hypermobile  interatrial septum.  Right atrium severely dilated.  . TUBAL LIGATION    . vaginal cyst removed     several times     OB History   No obstetric history on file.     Family History  Problem Relation Age of Onset  . Allergies Father   . Heart disease Father  before 60  . Heart failure Father   . Hypertension Father   . Hyperlipidemia Father   . Heart disease Mother   . Diabetes Mother   . Hypertension Mother   . Hyperlipidemia Mother   . Hypertension Sister   . Heart disease Sister        before 60  . Hyperlipidemia Sister   . Diabetes Brother   . Hypertension Brother   . Diabetes Sister   . Hypertension Sister   . Diabetes Son   . Hypertension Son   . Cancer Other     Social History   Tobacco Use  . Smoking status: Former Smoker    Packs/day: 0.50    Years: 25.00    Pack years: 12.50    Types: Cigarettes  . Smokeless tobacco: Never Used  . Tobacco comment: quit smoking 20+ytrs ago  Substance Use Topics  . Alcohol use: No  . Drug use: No    Home Medications Prior to Admission medications   Medication Sig Start Date End Date Taking? Authorizing Provider  Accu-Chek Softclix Lancets lancets Use as instructed to check blood sugars 2 times per day dx: e11.22 06/25/19   Minette Brine, FNP  acetaminophen (TYLENOL) 500 MG tablet Take 500-1,000 mg by mouth every 4 (four) hours as needed for moderate pain.     [provider]  albuterol (VENTOLIN HFA) 108 (90 Base) MCG/ACT inhaler Inhale 1-2 puffs into the lungs every 6 (six) hours as needed for wheezing or shortness of breath.    [provider]  atorvastatin (LIPITOR) 10 MG tablet Take 10 mg by mouth every evening.    [provider]  B Complex-C (B-COMPLEX WITH VITAMIN C) tablet Take 1 tablet by mouth daily.    [provider]  Blood Glucose Calibration (ACCU-CHEK AVIVA) SOLN Use as directed dx: e11.22 02/17/19   [name], MD  Blood Glucose Monitoring Suppl (ACCU-CHEK  AVIVA PLUS) w/Device KIT Use as directed to check blood sugars 2 times per day dx: e11.22 02/09/19   [name], MD  carvedilol (COREG) 25 MG tablet Take 1 tablet (25 mg total) by mouth 2 (two) times daily with a meal. 12/29/18   Leonie Man, MD  Cholecalciferol (VITAMIN D-3) 1000 units CAPS Take 1,000 Units by mouth daily.    [provider]  diclofenac sodium (VOLTAREN) 1 % GEL Apply 2 g topically 4 (four) times daily as needed (pain). 05/17/18   Eulogio Bear U, DO  DROPLET PEN NEEDLES 31G X 8 MM MISC USE DAILY WITH VICTOZA AND/OR NOVOLOG.  06/25/19   [name], MD  furosemide (LASIX) 40 MG tablet Take one tablet (71m) by mouth on Saturday and Sunday only.    [provider]  furosemide (LASIX) 80 MG tablet TAKE ONE TABLET BY MOUTH DAILY  MONDAY THRU FRIDAY 05/29/19   HLeonie Man MD  glucose blood (ACCU-CHEK AVIVA PLUS) test strip Use as instructed to check blood sugars 2 times per day dx: e11.22 06/25/19   MMinette Brine FNP  hydrALAZINE (APRESOLINE) 100 MG tablet Take 1 tablet (100 mg total) by mouth 2 (two) times daily. 06/22/19   HLeonie Man MD  hydrocortisone cream 1 % Apply 1 application topically daily as needed for itching.    [provider]  Insulin Glargine, 1 Unit Dial, (TOUJEO SOLOSTAR) 300 UNIT/ML SOPN Inject 36 Units into the skin at bedtime. Patient taking differently: Inject 34 Units into the skin at bedtime.  11/06/18   SB[facility] Cancer  Bailey Mech, MD  insulin lispro (HUMALOG) 100 UNIT/ML injection Inject 2-4 Units into the skin 3 (three) times daily as needed (high blood sugar over 150).    [provider]  Isopropyl Alcohol (ALCOHOL WIPES) 70 % MISC Apply 1 each topically 2 (two) times daily. 06/25/19   Minette Brine, FNP  isosorbide mononitrate (IMDUR) 60 MG 24 hr tablet Take 1 tablet (60 mg total) by mouth daily. 08/19/18   Leonie Man, MD  lamoTRIgine (LAMICTAL) 150 MG tablet Take 1 tablet (150 mg total) by mouth 2 (two) times  daily. 07/13/19   Cameron Sprang, MD  Menthol, Topical Analgesic, (STOPAIN ROLL-ON EX) Apply 1 application topically daily as needed (pain).    [provider]  metolazone (ZAROXOLYN) 2.5 MG tablet Take 2.5 mg by mouth See admin instructions. Take 2.5 mg by mouth Monday through Friday as needed if weight is greater or equal to 269 lbs    [provider]  Multiple Vitamins-Minerals (MULTIVITAMIN WITH MINERALS) tablet Take 1 tablet by mouth daily.    [provider]  neomycin-bacitracin-polymyxin (NEOSPORIN) ointment Apply 1 application topically as needed for wound care.    [provider]  Pancrelipase, Lip-Prot-Amyl, (ZENPEP) 25000-79000 units CPEP Take 1-2 capsules by mouth See admin instructions. Take 2 capsules in the morning, 1 capsule with lunch, and 2 capsules in the evening.    [provider]  potassium chloride SA (K-DUR) 20 MEQ tablet TAKE 2 TABLETS ONCE DAILY, BUT ON THE DAY YOU  TAKE METOLAZONE TAKE AN EXTRA TABLET FOR A TOTAL OF 3 TABLETS Patient taking differently: Take 40-60 mEq by mouth See admin instructions. Take 40 meq daily, when taking metolazone take a total of 60 meq daily 03/03/19   Leonie Man, MD  Semaglutide, 1 MG/DOSE, (OZEMPIC, 1 MG/DOSE,) 2 MG/1.5ML SOPN Inject 1 mg into the skin once a week. Patient taking differently: Inject 1 mg into the skin every Thursday.  02/17/19   [name], MD  sertraline (ZOLOFT) 25 MG tablet Take 25 mg by mouth See admin instructions. Take with 50 mg to equal 75 mg at bedtime    [provider]  sertraline (ZOLOFT) 50 MG tablet TAKE 1 TABLET EVERY DAY 05/11/19   [name], MD  Tolnaftate [facility] EX) Apply 1 application topically daily as needed (foot fungus).    [provider]    Allergies    Other, Penicillins, Sulfa antibiotics, Aspirin, and Codeine  Review of Systems   Review of Systems  Constitutional: Negative for diaphoresis and fever.  HENT: Negative  for sore throat.   Eyes: Negative for visual disturbance.  Respiratory: Positive for cough. Negative for shortness of breath.   Cardiovascular: Positive for chest pain.  Gastrointestinal: Positive for nausea and vomiting. Negative for abdominal pain.  Genitourinary: Negative for dysuria.  Musculoskeletal: Positive for back pain (chronic).  Skin: Negative for rash.  Neurological: Positive for seizures. Negative for headaches.    Physical Exam Updated Vital Signs BP 139/75 (BP Location: Right Arm)   Pulse (!) 58   Temp 97.6 F (36.4 C) (Oral)   Resp 20   SpO2 99%   Physical Exam Vitals and nursing note reviewed.  Constitutional:      General: She is not in acute distress.    Appearance: She is well-developed.  HENT:     Head: Normocephalic and atraumatic.  Eyes:     Conjunctiva/sclera: Conjunctivae normal.  Cardiovascular:     Rate and Rhythm: Normal rate and regular  rhythm.     Heart sounds: No murmur.  Pulmonary:     Effort: Pulmonary effort is normal. No respiratory distress.     Breath sounds: Normal breath sounds.  Abdominal:     Palpations: Abdomen is soft.     Tenderness: There is no abdominal tenderness.  Musculoskeletal:     Cervical back: Neck supple.     Right lower leg: No tenderness. Edema (bilateral mild swelling ankles) present.     Left lower leg: No tenderness. Edema present.  Skin:    General: Skin is warm and dry.     Capillary Refill: Capillary refill takes less than 2 seconds.  Neurological:     General: No focal deficit present.     Mental Status: She is alert.     GCS: GCS eye subscore is 4. GCS verbal subscore is 5. GCS motor subscore is 6.     Cranial Nerves: Cranial nerves are intact.     Sensory: Sensation is intact.     Motor: Motor function is intact.     Coordination: Coordination is intact.     ED Results / Procedures / Treatments   Labs (all labs ordered are listed, but only abnormal results are displayed) Labs Reviewed  BASIC  METABOLIC PANEL - Abnormal; Notable for the following components:      Result Value   Potassium 3.4 (*)    Chloride 97 (*)    All other components within normal limits  CBC - Abnormal; Notable for the following components:   RBC 5.19 (*)    Hemoglobin 11.8 (*)    MCV 74.4 (*)    MCH 22.7 (*)    RDW 16.7 (*)    All other components within normal limits  URINALYSIS, ROUTINE W REFLEX MICROSCOPIC - Abnormal; Notable for the following components:   Color, Urine COLORLESS (*)    Specific Gravity, Urine 1.004 (*)    All other components within normal limits  MAGNESIUM  LAMOTRIGINE LEVEL  CBG MONITORING, ED  CBG MONITORING, ED  TROPONIN I (HIGH SENSITIVITY)  TROPONIN I (HIGH SENSITIVITY)    EKG EKG Interpretation  Date/Time:  Saturday August 01 2019 11:13:19 EST Ventricular Rate:  57 PR Interval:    QRS Duration: 104 QT Interval:  443 QTC Calculation: 432 R Axis:   106 Text Interpretation: Sinus rhythm Atrial premature complex Prolonged PR interval Left atrial enlargement Probable right ventricular hypertrophy No significant change since 9/20 Confirmed by Aletta Edouard [phone]) on 08/01/2019 11:59:28 AM   Radiology DG Chest 2 View  Result Date: 07/31/2019 CLINICAL DATA:  Chest pain and shortness of breath. EXAM: CHEST - 2 VIEW COMPARISON:  February 24, 2019 FINDINGS: Chronic appearing, mildly increased lung markings are seen without evidence of focal consolidation, pleural effusion or pneumothorax. The cardiac silhouette is moderately enlarged and unchanged in size. Multilevel degenerative changes seen throughout the thoracic spine. Bilateral radiopaque pedicle screws are seen within the visualized portion of the mid lumbar spine. IMPRESSION: Stable cardiomegaly without acute or active cardiopulmonary disease. Electronically Signed   By: Virgina Norfolk M.D.   On: 07/31/2019 22:01   NM Pulmonary Perfusion  Result Date: 07/31/2019 CLINICAL DATA:  Evaluate for chronic pulmonary  artery thromboembolic disease. EXAM: NUCLEAR MEDICINE PERFUSION LUNG SCAN TECHNIQUE: Perfusion images were obtained in multiple projections after intravenous injection of radiopharmaceutical. Ventilation scans intentionally deferred if perfusion scan and chest x-ray adequate for interpretation during COVID 19 epidemic. RADIOPHARMACEUTICALS:  1.6 mCi Tc-64mMAA IV COMPARISON:  Chest radiograph from  07/31/2019 FINDINGS: There is a uniform distribution of the radiopharmaceutical throughout both lungs. No segmental perfusion defects are identified to suggest either acute or chronic pulmonary thromboembolic disease. IMPRESSION: 1. Negative exam. No evidence for chronic pulmonary thromboembolic disease. Electronically Signed   By: Kerby Moors M.D.   On: 07/31/2019 14:56   DG Chest Portable 1 View  Result Date: 08/01/2019 CLINICAL DATA:  Seizure. EXAM: PORTABLE CHEST 1 VIEW COMPARISON:  07/31/2019 FINDINGS: 11:54 a.m. low lung volumes. Cardiopericardial silhouette is at upper limits of normal for size. There is pulmonary vascular congestion without overt pulmonary edema. Bibasilar atelectasis noted. Similar bilateral hilar prominence. Possible tiny bilateral pleural effusions. The visualized bony structures of the thorax are intact. Telemetry leads overlie the chest. IMPRESSION: Low volume film with borderline cardiomegaly and basilar atelectasis. Probable tiny effusions. Electronically Signed   By: Misty Stanley M.D.   On: 08/01/2019 12:27    Procedures Procedures (including critical care time)  Medications Ordered in ED Medications  potassium chloride SA (KLOR-CON) CR tablet 20 mEq (20 mEq Oral Given 08/01/19 1452)  levETIRAcetam (KEPPRA) tablet 500 mg (500 mg Oral Given 08/01/19 1452)    ED Course  I have reviewed the triage vital signs and the nursing notes.  Pertinent labs & imaging results that were available during my care of the patient were reviewed by me and considered in my medical decision  making (see chart for details).  Clinical Course as of Aug 01 2211  Sat Aug 01, 2019  1158 Differential includes breakthrough seizure, metabolic derangement, infection,   [MB]  1344 Patient's potassium mildly low at 3.4.  Normal magnesium normal kidney function normal sugar.  Hemoglobin about her baseline.  Troponin at 7 doubt ischemia.   [MB]  1991 Discussed with Dr. Cheral Marker neurology who recommended   [MB]  1344 With her dstarting Keppra 500 twice daily and having the patient follow-up primary neurologist.   [MB]  1530 I called the patient's sister who is her guardian and updated her with plan.  She is comfortable with plan and is sending her son to come pick her up.   [MB]    Clinical Course User Index [MB] Hayden Rasmussen, MD   MDM Rules/Calculators/A&P                       Final Clinical Impression(s) / ED Diagnoses Final diagnoses:  Seizure [facility])  Atypical chest pain    Rx / DC Orders ED Discharge Orders         Ordered    levETIRAcetam (KEPPRA) 500 MG tablet  2 times daily     08/01/19 1422           Hayden Rasmussen, MD 08/01/19 2215

## 2019-08-03 ENCOUNTER — Ambulatory Visit: Payer: Self-pay

## 2019-08-03 DIAGNOSIS — G40909 Epilepsy, unspecified, not intractable, without status epilepticus: Secondary | ICD-10-CM

## 2019-08-03 LAB — LAMOTRIGINE LEVEL: Lamotrigine Lvl: 8.6 ug/mL (ref 2.0–20.0)

## 2019-08-03 NOTE — Chronic Care Management (AMB) (Signed)
  Care Management    Consult Note  08/03/2019 Name: [name] MRN: [id number] DOB: December 19, 1945  Care management team received notification of patient's recent emergency department visit related to seizure disorder .Based on review of health record, [name] is currently active in the embedded care coordination program.. Unsuccessful outbound call placed to the patient to assist with care coordination needs. SW left a HIPAA compliant voice message requesting a return call. Collaboration with embedded care management team members to update on patients recent ED presentation.   Plan: SW will follow up with patient by phone over the next 7 days.  Daneen Schick, BSW, CDP Social Worker, Certified Dementia Practitioner [place] [facility] [place] [facility] [phone]

## 2019-08-05 DIAGNOSIS — G4733 Obstructive sleep apnea (adult) (pediatric): Secondary | ICD-10-CM | POA: Diagnosis not present

## 2019-08-06 ENCOUNTER — Other Ambulatory Visit: Payer: Self-pay | Admitting: Cardiology

## 2019-08-07 ENCOUNTER — Ambulatory Visit: Payer: Self-pay

## 2019-08-07 DIAGNOSIS — E1122 Type 2 diabetes mellitus with diabetic chronic kidney disease: Secondary | ICD-10-CM

## 2019-08-07 DIAGNOSIS — G40909 Epilepsy, unspecified, not intractable, without status epilepticus: Secondary | ICD-10-CM

## 2019-08-07 NOTE — Chronic Care Management (AMB) (Signed)
Chronic Care Management    Social Work Follow Up Note  08/07/2019 Name: [name] MRN: [id number] DOB: 12/01/1945  [name] is a 74 y.o. year old female who is a primary care patient of Sanders, Robyn, MD. The CCM team was consulted for assistance with care management.   Review of patient status, including review of consultants reports, other relevant assessments, and collaboration with appropriate care team members and the patient's provider was performed as part of comprehensive patient evaluation and provision of chronic care management services.    SW placed an outbound call to the patient to assist with care coordination needs post ED visit.  Outpatient Encounter Medications as of 08/07/2019  Medication Sig  . Accu-Chek Softclix Lancets lancets Use as instructed to check blood sugars 2 times per day dx: e11.22  . acetaminophen (TYLENOL) 500 MG tablet Take 500-1,000 mg by mouth every 4 (four) hours as needed for moderate pain.   . albuterol (VENTOLIN HFA) 108 (90 Base) MCG/ACT inhaler Inhale 1-2 puffs into the lungs every 6 (six) hours as needed for wheezing or shortness of breath.  . atorvastatin (LIPITOR) 10 MG tablet Take 10 mg by mouth every evening.  . B Complex-C (B-COMPLEX WITH VITAMIN C) tablet Take 1 tablet by mouth daily.  . Blood Glucose Calibration (ACCU-CHEK AVIVA) SOLN Use as directed dx: e11.22  . Blood Glucose Monitoring Suppl (ACCU-CHEK AVIVA PLUS) w/Device KIT Use as directed to check blood sugars 2 times per day dx: e11.22  . carvedilol (COREG) 25 MG tablet Take 1 tablet (25 mg total) by mouth 2 (two) times daily with a meal.  . Cholecalciferol (VITAMIN D-3) 1000 units CAPS Take 1,000 Units by mouth daily.  . diclofenac sodium (VOLTAREN) 1 % GEL Apply 2 g topically 4 (four) times daily as needed (pain).  . DROPLET PEN NEEDLES 31G X 8 MM MISC USE DAILY WITH VICTOZA AND/OR NOVOLOG.   . furosemide (LASIX) 40 MG tablet Take one tablet (40mg) by mouth on  Saturday and Sunday only.  . furosemide (LASIX) 80 MG tablet TAKE ONE TABLET BY MOUTH DAILY  MONDAY THRU FRIDAY  . glucose blood (ACCU-CHEK AVIVA PLUS) test strip Use as instructed to check blood sugars 2 times per day dx: e11.22  . hydrALAZINE (APRESOLINE) 100 MG tablet Take 1 tablet (100 mg total) by mouth 2 (two) times daily.  . hydrocortisone cream 1 % Apply 1 application topically daily as needed for itching.  . Insulin Glargine, 1 Unit Dial, (TOUJEO SOLOSTAR) 300 UNIT/ML SOPN Inject 36 Units into the skin at bedtime. (Patient taking differently: Inject 34 Units into the skin at bedtime. )  . insulin lispro (HUMALOG) 100 UNIT/ML injection Inject 2-4 Units into the skin 3 (three) times daily as needed (high blood sugar over 150).  . Isopropyl Alcohol (ALCOHOL WIPES) 70 % MISC Apply 1 each topically 2 (two) times daily.  . isosorbide mononitrate (IMDUR) 60 MG 24 hr tablet Take 1 tablet (60 mg total) by mouth daily.  . lamoTRIgine (LAMICTAL) 150 MG tablet Take 1 tablet (150 mg total) by mouth 2 (two) times daily.  . levETIRAcetam (KEPPRA) 500 MG tablet Take 1 tablet (500 mg total) by mouth 2 (two) times daily.  . Menthol, Topical Analgesic, (STOPAIN ROLL-ON EX) Apply 1 application topically daily as needed (pain).  . metolazone (ZAROXOLYN) 2.5 MG tablet Take 2.5 mg by mouth See admin instructions. Take 2.5 mg by mouth Monday through Friday as needed if weight is greater or equal to   269 lbs  . Multiple Vitamins-Minerals (MULTIVITAMIN WITH MINERALS) tablet Take 1 tablet by mouth daily.  . neomycin-bacitracin-polymyxin (NEOSPORIN) ointment Apply 1 application topically as needed for wound care.  . Pancrelipase, Lip-Prot-Amyl, (ZENPEP) 25000-79000 units CPEP Take 1-2 capsules by mouth See admin instructions. Take 2 capsules in the morning, 1 capsule with lunch, and 2 capsules in the evening.  . potassium chloride SA (K-DUR) 20 MEQ tablet TAKE 2 TABLETS ONCE DAILY, BUT ON THE DAY YOU  TAKE METOLAZONE  TAKE AN EXTRA TABLET FOR A TOTAL OF 3 TABLETS (Patient taking differently: Take 40-60 mEq by mouth See admin instructions. Take 40 meq daily, when taking metolazone take a total of 60 meq daily)  . Semaglutide, 1 MG/DOSE, (OZEMPIC, 1 MG/DOSE,) 2 MG/1.5ML SOPN Inject 1 mg into the skin once a week. (Patient taking differently: Inject 1 mg into the skin every Thursday. )  . sertraline (ZOLOFT) 25 MG tablet Take 25 mg by mouth See admin instructions. Take with 50 mg to equal 75 mg at bedtime  . sertraline (ZOLOFT) 50 MG tablet TAKE 1 TABLET EVERY DAY  . Tolnaftate (TINACTIN EX) Apply 1 application topically daily as needed (foot fungus).   No facility-administered encounter medications on file as of 08/07/2019.     Goals Addressed            This Visit's Progress   . Collaborate with RN Case Manager and PharmD to assist with care coordination needs in response to recent ED visit       Current Barriers:  . Care management and care coordination needs related to DMII and Seizure Disorder  which resulted in emergency department visit  Clinical Social Work Clinical Goal(s):  . Over the next 30 days, patient will collaborate with care management team to assist with care coordination needs in response to recent emergency department visit  CCM SW Interventions: Completed 08/07/19 with the patient . Outbound call placed to the patient to assist with care coordination needs . Reviewed ED discharge paperwork with the patient o Discussed directive to follow up with neurology - Patient reports she has not called to make a follow up appointment and does not plan to as she has an upcoming appointment in April and does not want to pay a co-pay. - Reviewed importance of following ED provider instructions. Patient stated "I filled the medicine so I think I will be ok" . Assessed for new symptoms or seizure like activity o Patient denies seizures but does report "I still feel dizzy" o Advised the patient to  contact her primary care provider on Monday 2/15 if she is still feeling dizzy over the weekend o Collaboration with Dr. Sanders and care management team regarding recent ED visit and self reported dizziness o Scheduled follow up call to the patient over the next three weeks  Patient Self Care Activities:  . Patient verbalizes understanding of plan to contact her primary care provider if symptoms continue . Self administers medications as prescribed . Calls pharmacy for medication refills . Calls provider office for new concerns or questions . Does not adhere to provider recommendations re: neurology follow up  Initial goal documentation            Follow Up Plan: SW will follow up with patient by phone over the next three weeks.    , BSW, CDP Social Worker, Certified Dementia Practitioner [facility] [phone]  Total time spent performing care coordination and/or care management activities with the patient by   phone or face to face = 8 minutes.      

## 2019-08-07 NOTE — Patient Instructions (Signed)
Social Worker Visit Information  Goals we discussed today:  Goals Addressed            This Visit's Progress   . Collaborate with RN Case Manager and PharmD to assist with care coordination needs in response to recent ED visit       Current Barriers:  . Care management and care coordination needs related to DMII and Seizure Disorder  which resulted in emergency department visit  Clinical Social Work Clinical Goal(s):  Marland Kitchen Over the next 30 days, patient will collaborate with care management team to assist with care coordination needs in response to recent emergency department visit  CCM SW Interventions: Completed 08/07/19 with the patient . Outbound call placed to the patient to assist with care coordination needs . Reviewed ED discharge paperwork with the patient o Discussed directive to follow up with neurology - Patient reports she has not called to make a follow up appointment and does not plan to as she has an upcoming appointment in April and does not want to pay a co-pay. - Reviewed importance of following ED provider instructions. Patient stated "I filled the medicine so I think I will be ok" . Assessed for new symptoms or seizure like activity o Patient denies seizures but does report "I still feel dizzy" o Advised the patient to contact her primary care provider on Monday 2/15 if she is still feeling dizzy over the weekend o Collaboration with Dr. [facility] and care management team regarding recent ED visit and self reported dizziness o Scheduled follow up call to the patient over the next three weeks  Patient Self Care Activities:  . Patient verbalizes understanding of plan to contact her primary care provider if symptoms continue . Self administers medications as prescribed . Calls pharmacy for medication refills . Calls provider office for new concerns or questions . Does not adhere to provider recommendations re: neurology follow up  Initial goal documentation             Follow Up Plan: SW will follow up with patient by phone over the next three weeks.   Daneen Schick, BSW, CDP Social Worker, Certified Dementia Practitioner [place] [facility] [place] [facility] [phone]

## 2019-08-14 ENCOUNTER — Other Ambulatory Visit: Payer: Self-pay | Admitting: Internal Medicine

## 2019-08-15 ENCOUNTER — Other Ambulatory Visit: Payer: Self-pay | Admitting: Cardiology

## 2019-08-15 DIAGNOSIS — I2510 Atherosclerotic heart disease of native coronary artery without angina pectoris: Secondary | ICD-10-CM

## 2019-08-18 ENCOUNTER — Telehealth: Payer: Self-pay

## 2019-08-21 ENCOUNTER — Telehealth: Payer: Self-pay

## 2019-08-21 ENCOUNTER — Ambulatory Visit: Payer: Self-pay

## 2019-08-21 DIAGNOSIS — E1122 Type 2 diabetes mellitus with diabetic chronic kidney disease: Secondary | ICD-10-CM

## 2019-08-21 DIAGNOSIS — G40909 Epilepsy, unspecified, not intractable, without status epilepticus: Secondary | ICD-10-CM

## 2019-08-21 NOTE — Chronic Care Management (AMB) (Signed)
  Chronic Care Management   Outreach Note  08/21/2019 Name: [name] MRN: [id number] DOB: 1945-11-12  Referred by: [name], MD Reason for referral : Care Coordination   SW placed an unsuccessful outbound call to the patient to assist with care coordination needs. HIPAA compliant voice message left requesting a return call.  Follow Up Plan: SW will outreach the patient again over the next 14 days.  Daneen Schick, BSW, CDP Social Worker, Certified Dementia Practitioner [place] [facility] [phone]

## 2019-08-24 ENCOUNTER — Ambulatory Visit (INDEPENDENT_AMBULATORY_CARE_PROVIDER_SITE_OTHER): Payer: Medicare HMO | Admitting: Internal Medicine

## 2019-08-24 ENCOUNTER — Other Ambulatory Visit: Payer: Self-pay

## 2019-08-24 ENCOUNTER — Encounter: Payer: Self-pay | Admitting: Internal Medicine

## 2019-08-24 VITALS — Temp 98.3°F | Ht 61.0 in | Wt 224.4 lb

## 2019-08-24 DIAGNOSIS — I5032 Chronic diastolic (congestive) heart failure: Secondary | ICD-10-CM

## 2019-08-24 DIAGNOSIS — I110 Hypertensive heart disease with heart failure: Secondary | ICD-10-CM

## 2019-08-24 DIAGNOSIS — I130 Hypertensive heart and chronic kidney disease with heart failure and stage 1 through stage 4 chronic kidney disease, or unspecified chronic kidney disease: Secondary | ICD-10-CM | POA: Diagnosis not present

## 2019-08-24 DIAGNOSIS — E1122 Type 2 diabetes mellitus with diabetic chronic kidney disease: Secondary | ICD-10-CM | POA: Diagnosis not present

## 2019-08-24 DIAGNOSIS — G40909 Epilepsy, unspecified, not intractable, without status epilepticus: Secondary | ICD-10-CM | POA: Diagnosis not present

## 2019-08-24 DIAGNOSIS — Z79899 Other long term (current) drug therapy: Secondary | ICD-10-CM

## 2019-08-24 DIAGNOSIS — E876 Hypokalemia: Secondary | ICD-10-CM | POA: Diagnosis not present

## 2019-08-24 DIAGNOSIS — Z794 Long term (current) use of insulin: Secondary | ICD-10-CM | POA: Diagnosis not present

## 2019-08-24 DIAGNOSIS — Z6841 Body Mass Index (BMI) 40.0 and over, adult: Secondary | ICD-10-CM

## 2019-08-24 DIAGNOSIS — Z712 Person consulting for explanation of examination or test findings: Secondary | ICD-10-CM

## 2019-08-24 DIAGNOSIS — J449 Chronic obstructive pulmonary disease, unspecified: Secondary | ICD-10-CM | POA: Diagnosis not present

## 2019-08-24 DIAGNOSIS — N182 Chronic kidney disease, stage 2 (mild): Secondary | ICD-10-CM

## 2019-08-24 MED ORDER — LEVETIRACETAM 500 MG PO TABS: 500.0000 mg | ORAL_TABLET | Freq: Two times a day (BID) | ORAL | 0 refills | Status: DC

## 2019-08-24 NOTE — Progress Notes (Signed)
This visit occurred during the SARS-CoV-2 public health emergency.  Safety protocols were in place, including screening questions prior to the visit, additional usage of staff PPE, and extensive cleaning of exam room while observing appropriate contact time as indicated for disinfecting solutions.  Subjective:     Patient ID: [name] , female    DOB: [birth year] , 74 y.o.   MRN: [id number]   Chief Complaint  Patient presents with  . Diabetes  . Hypertension    HPI  She is here today for a diabetes check. She is accompanied by her sister today. Her sister is concerned about the "spells" the patient keeps having. She was seen at [facility] ER on 2/6 for seizures. She was discharged home with Keppra, but the patient has not started the medication.  She has had at least 3-4 "episodes" since the ED visit. Her most recent visit occurred last Friday. She was preparing to make a phone call to a friend, and the next thing she knew she was on the floor. She does not think she hit her head, but she did hurt her right knee.  She has been using Voltaren gel on occasion. There is some pain with ambulation.   Diabetes She presents for her follow-up diabetic visit. She has type 2 diabetes mellitus. Her disease course has been improving. There are no hypoglycemic associated symptoms. Pertinent negatives for diabetes include no blurred vision and no chest pain. There are no hypoglycemic complications. Diabetic complications include nephropathy. Risk factors for coronary artery disease include diabetes mellitus, dyslipidemia, hypertension, obesity, post-menopausal and sedentary lifestyle. She is compliant with treatment some of the time. Her weight is fluctuating minimally. She is following a generally healthy diet. She never participates in exercise. Her home blood glucose trend is decreasing steadily. Her breakfast blood glucose is taken between 8-9 am. Her breakfast blood glucose range is generally 130-140  mg/dl. An ACE inhibitor/angiotensin II receptor blocker is being taken. Eye exam is not current.  Hypertension This is a chronic problem. The current episode started more than 1 year ago. The problem has been gradually improving since onset. The problem is controlled. Pertinent negatives include no blurred vision, chest pain, palpitations or shortness of breath. The current treatment provides moderate improvement. Hypertensive end-organ damage includes kidney disease. Identifiable causes of hypertension include sleep apnea.     Past Medical History:  Diagnosis Date  . Abnormal liver function     in the past.  . Anemia   . Arthritis    all over  . Bruises easily   . Cataract    right eye;immature  . Chronic back pain    stenosis  . Chronic cough   . Chronic kidney disease   . COPD (chronic obstructive pulmonary disease) ([place])   . Demand myocardial infarction [facility]) 2012   Demand Infarction in setting of Pancreatitis --> mild Troponin elevation, NON-OBSTRUCTIVE CAD  . Depression    takes Abilify daily as well as Zoloft  . Diastolic heart failure 2035   Grade 1 diastolic Dysfunction by Echo   . Diverticulosis   . DM (diabetes mellitus) ([place])    takes Victoza daily as well as Lantus and Humalog  . Empty sella ([place])    on MRI in 2009.  . Glaucoma   . Headache(784.0)    last migraine-4-10yr ago  . History of blood transfusion    no abnormal reaction noted  . History of colon polyps    benign  . HTN (hypertension)  takes [employer] daily  . Hyperlipidemia    takes Lipitor daily  . Joint swelling   . Nocturia   . Obesity hypoventilation syndrome ([place])   . Obstructive sleep apnea 02/2018   Notably improved split-night study with weight loss from 270 (2017) down to 250 pounds (2019).  . Pancreatitis    takes Pancrelipase daily  . Parkinson's disease ([place])    takes Sinemet daily  . Peripheral neuropathy   . Pneumonia 2012  . Seizures ([place])    takes Lamictal  daily and Primidone nightly;last seizure 2wks ago  . Urinary urgency    With increased frequency  . Varicose veins of both lower extremities with pain    With edema.  Takes daily Lasix     Family History  Problem Relation Age of Onset  . Allergies Father   . Heart disease Father        before 29  . Heart failure Father   . Hypertension Father   . Hyperlipidemia Father   . Heart disease Mother   . Diabetes Mother   . Hypertension Mother   . Hyperlipidemia Mother   . Hypertension Sister   . Heart disease Sister        before 45  . Hyperlipidemia Sister   . Diabetes Brother   . Hypertension Brother   . Diabetes Sister   . Hypertension Sister   . Diabetes Son   . Hypertension Son   . Cancer Other      Current Outpatient Medications:  .  Accu-Chek Softclix Lancets lancets, Use as instructed to check blood sugars 2 times per day dx: e11.22, Disp: 300 each, Rfl: 3 .  acetaminophen (TYLENOL) 500 MG tablet, Take 500-1,000 mg by mouth every 4 (four) hours as needed for moderate pain. , Disp: , Rfl:  .  atorvastatin (LIPITOR) 10 MG tablet, Take 10 mg by mouth every evening., Disp: , Rfl:  .  B Complex-C (B-COMPLEX WITH VITAMIN C) tablet, Take 1 tablet by mouth daily., Disp: , Rfl:  .  Blood Glucose Calibration (ACCU-CHEK AVIVA) SOLN, Use as directed dx: e11.22, Disp: 1 each, Rfl: 3 .  Blood Glucose Monitoring Suppl (ACCU-CHEK AVIVA PLUS) w/Device KIT, Use as directed to check blood sugars 2 times per day dx: e11.22, Disp: 1 kit, Rfl: 3 .  carvedilol (COREG) 25 MG tablet, Take 1 tablet (25 mg total) by mouth 2 (two) times daily with a meal., Disp: 180 tablet, Rfl: 3 .  Cholecalciferol (VITAMIN D-3) 1000 units CAPS, Take 1,000 Units by mouth daily., Disp: , Rfl:  .  diclofenac sodium (VOLTAREN) 1 % GEL, Apply 2 g topically 4 (four) times daily as needed (pain)., Disp: , Rfl:  .  DROPLET PEN NEEDLES 31G X 8 MM MISC, USE DAILY WITH VICTOZA AND/OR NOVOLOG. , Disp: 400 each, Rfl: 1 .   furosemide (LASIX) 40 MG tablet, TAKE ONE TABLET BY MOUTH SATURDAY AND SUNDAY, Disp: 24 tablet, Rfl: 0 .  furosemide (LASIX) 80 MG tablet, TAKE ONE TABLET BY MOUTH DAILY  MONDAY THRU FRIDAY, Disp: 60 tablet, Rfl: 11 .  glucose blood (ACCU-CHEK AVIVA PLUS) test strip, Use as instructed to check blood sugars 2 times per day dx: e11.22, Disp: 300 each, Rfl: 3 .  hydrALAZINE (APRESOLINE) 100 MG tablet, Take 1 tablet (100 mg total) by mouth 2 (two) times daily., Disp: 180 tablet, Rfl: 2 .  hydrocortisone cream 1 %, Apply 1 application topically daily as needed for itching., Disp: , Rfl:  .  Insulin Glargine, 1 Unit Dial, (TOUJEO SOLOSTAR) 300 UNIT/ML SOPN, Inject 36 Units into the skin at bedtime. (Patient taking differently: Inject 34 Units into the skin at bedtime. ), Disp: 15 pen, Rfl: 2 .  Isopropyl Alcohol (ALCOHOL WIPES) 70 % MISC, Apply 1 each topically 2 (two) times daily., Disp: 300 each, Rfl: 3 .  isosorbide mononitrate (IMDUR) 60 MG 24 hr tablet, Take 1 tablet (60 mg total) by mouth daily., Disp: 90 tablet, Rfl: 0 .  lamoTRIgine (LAMICTAL) 150 MG tablet, Take 1 tablet (150 mg total) by mouth 2 (two) times daily., Disp: 180 tablet, Rfl: 3 .  levETIRAcetam (KEPPRA) 500 MG tablet, Take 1 tablet (500 mg total) by mouth 2 (two) times daily., Disp: 60 tablet, Rfl: 0 .  Menthol, Topical Analgesic, (STOPAIN ROLL-ON EX), Apply 1 application topically daily as needed (pain)., Disp: , Rfl:  .  Multiple Vitamins-Minerals (MULTIVITAMIN WITH MINERALS) tablet, Take 1 tablet by mouth daily., Disp: , Rfl:  .  neomycin-bacitracin-polymyxin (NEOSPORIN) ointment, Apply 1 application topically as needed for wound care., Disp: , Rfl:  .  OZEMPIC, 1 MG/DOSE, 2 MG/1.5ML SOPN, INJECT 1MG INTO THE SKIN ONCE A WEEK, Disp: 12 mL, Rfl: 0 .  Pancrelipase, Lip-Prot-Amyl, (ZENPEP) 25000-79000 units CPEP, Take 1-2 capsules by mouth See admin instructions. Take 2 capsules in the morning, 1 capsule with lunch, and 2 capsules in  the evening., Disp: , Rfl:  .  potassium chloride SA (K-DUR) 20 MEQ tablet, TAKE 2 TABLETS ONCE DAILY, BUT ON THE DAY YOU  TAKE METOLAZONE TAKE AN EXTRA TABLET FOR A TOTAL OF 3 TABLETS (Patient taking differently: Take 40-60 mEq by mouth See admin instructions. Take 40 meq daily, when taking metolazone take a total of 60 meq daily), Disp: 204 tablet, Rfl: 0 .  sertraline (ZOLOFT) 25 MG tablet, Take 25 mg by mouth See admin instructions. Take with 50 mg to equal 75 mg at bedtime, Disp: , Rfl:  .  sertraline (ZOLOFT) 50 MG tablet, TAKE 1 TABLET EVERY DAY, Disp: 90 tablet, Rfl: 2 .  Tolnaftate (TINACTIN EX), Apply 1 application topically daily as needed (foot fungus)., Disp: , Rfl:  .  albuterol (VENTOLIN HFA) 108 (90 Base) MCG/ACT inhaler, Inhale 1-2 puffs into the lungs every 6 (six) hours as needed for wheezing or shortness of breath., Disp: , Rfl:  .  insulin lispro (HUMALOG) 100 UNIT/ML injection, Inject 2-4 Units into the skin 3 (three) times daily as needed (high blood sugar over 150)., Disp: , Rfl:  .  metolazone (ZAROXOLYN) 2.5 MG tablet, Take 2.5 mg by mouth See admin instructions. Take 2.5 mg by mouth Monday through Friday as needed if weight is greater or equal to 269 lbs, Disp: , Rfl:    Allergies  Allergen Reactions  . Other Anaphylaxis and Rash    Bleach  . Penicillins Hives    Did it involve swelling of the face/tongue/throat, SOB, or low BP? No Did it involve sudden or severe rash/hives, skin peeling, or any reaction on the inside of your mouth or nose? Yes Did you need to seek medical attention at a hospital or doctor's office? Yes When did it last happen?Childhood allergy  If all above answers are "NO", may proceed with cephalosporin use.    . Sulfa Antibiotics Hives  . Aspirin Other (See Comments)     On aspirin 81 mg - Rectal bleeding in dec 2018  . Codeine     Headache and makes the patient feel "off"  Review of Systems  Constitutional: Negative.   Eyes:  Negative for blurred vision.  Respiratory: Negative.  Negative for shortness of breath.   Cardiovascular: Negative.  Negative for chest pain and palpitations.  Gastrointestinal: Negative.   Neurological: Negative.        She admits that she has yet to start Rew as prescribed by ER.   Psychiatric/Behavioral: Negative.      Today's Vitals   08/24/19 1418  Temp: 98.3 F (36.8 C)  TempSrc: Oral  Weight: 224 lb 6.4 oz (101.8 kg)  Height: 5' 1"  (1.549 m)  PainSc: 10-Worst pain ever  PainLoc: Knee   Body mass index is 42.4 kg/m.   Wt Readings from Last 3 Encounters:  08/24/19 224 lb 6.4 oz (101.8 kg)  07/23/19 223 lb 3.2 oz (101.2 kg)  06/24/19 224 lb 3.2 oz (101.7 kg)     Objective:  Physical Exam Vitals and nursing note reviewed.  Constitutional:      Appearance: Normal appearance. She is obese.  HENT:     Head: Normocephalic and atraumatic.  Cardiovascular:     Rate and Rhythm: Normal rate and regular rhythm.     Heart sounds: Normal heart sounds.  Pulmonary:     Effort: Pulmonary effort is normal.     Breath sounds: Normal breath sounds.  Skin:    General: Skin is warm.  Neurological:     General: No focal deficit present.     Mental Status: She is alert.  Psychiatric:        Mood and Affect: Mood normal.        Behavior: Behavior normal.         Assessment And Plan:     1. Type 2 diabetes mellitus with stage 2 chronic kidney disease, with long-term current use of insulin (HCC)  Chronic, I will check labs as listed below.  Importance of regular exercise was discussed with the patient. She is encouraged to increase her daily activity level.   - BMP8+EGFR - Hemoglobin A1c  2. Hypertensive heart disease with chronic diastolic congestive heart failure (HCC)  Chronic, well controlled. She will continue with current meds. She is encouraged to limit her salt intake.   3. Seizure disorder [facility])  ER records reviewed in detail during her visit.  She was  advised to start meds as prescribed by ER. Pt advised that I will have referral coordinator try to move Neuro appt up. She and her sister are in agreement.   4. Hypopotassemia  I will recheck potassium level today.   5. Drug therapy  I will check lamotrigine level.  - Lamotrigine level  6. Class 3 severe obesity due to excess calories with serious comorbidity and body mass index (BMI) of 40.0 to 44.9 in adult (HCC)  BMI 42. She was congratulated on her weight loss thus far and encouraged to make further strides. Encouraged to strive for BMI less than 35 to decrease cardiac risk.   I personally spent 25 minutes face-to-face and non-face-to-face in the care of this patient, which includes all pre-, intra-, and post visit time on the date of service.  [name], MD    THE PATIENT IS ENCOURAGED TO PRACTICE SOCIAL DISTANCING DUE TO THE COVID-19 PANDEMIC.

## 2019-08-24 NOTE — Patient Instructions (Addendum)
March 19th at 4pm Dr. Delice Lesch   Seizure, Adult A seizure is a sudden burst of abnormal electrical activity in the brain. Seizures usually last from 30 seconds to 2 minutes. They can cause many different symptoms. Usually, seizures are not harmful unless they last a long time. What are the causes? Common causes of this condition include:  Fever or infection.  Conditions that affect the brain, such as: ? A brain abnormality that you were born with. ? A brain or head injury. ? Bleeding in the brain. ? A tumor. ? Stroke. ? Brain disorders such as autism or cerebral palsy.  Low blood sugar.  Conditions that are passed from parent to child (are inherited).  Problems with substances, such as: ? Having a reaction to a drug or a medicine. ? Suddenly stopping the use of a substance (withdrawal). In some cases, the cause may not be known. A person who has repeated seizures over time without a clear cause has a condition called epilepsy. What increases the risk? You are more likely to get this condition if you have:  A family history of epilepsy.  Had a seizure in the past.  A brain disorder.  A history of head injury, lack of oxygen at birth, or strokes. What are the signs or symptoms? There are many types of seizures. The symptoms vary depending on the type of seizure you have. Examples of symptoms during a seizure include:  Shaking (convulsions).  Stiffness in the body.  Passing out (losing consciousness).  Head nodding.  Staring.  Not responding to sound or touch.  Loss of bladder control and bowel control. Some people have symptoms right before and right after a seizure happens. Symptoms before a seizure may include:  Fear.  Worry (anxiety).  Feeling like you may vomit (nauseous).  Feeling like the room is spinning (vertigo).  Feeling like you saw or heard something before (dj vu).  Odd tastes or smells.  Changes in how you see. You may see flashing  lights or spots. Symptoms after a seizure happens can include:  Confusion.  Sleepiness.  Headache.  Weakness on one side of the body. How is this treated? Most seizures will stop on their own in under 5 minutes. In these cases, no treatment is needed. Seizures that last longer than 5 minutes will usually need treatment. Treatment can include:  Medicines given through an IV tube.  Avoiding things that are known to cause your seizures. These can include medicines that you take for another condition.  Medicines to treat epilepsy.  Surgery to stop the seizures. This may be needed if medicines do not help. Follow these instructions at home: Medicines  Take over-the-counter and prescription medicines only as told by your doctor.  Do not eat or drink anything that may keep your medicine from working, such as alcohol. Activity  Do not do any activities that would be dangerous if you had another seizure, like driving or swimming. Wait until your doctor says it is safe for you to do them.  If you live in the [place] ask your local DMV (department of motor vehicles) when you can drive.  Get plenty of rest. Teaching others Teach friends and family what to do when you have a seizure. They should:  Lay you on the ground.  Protect your head and body.  Loosen any tight clothing around your neck.  Turn you on your side.  Not hold you down.  Not put anything into your mouth.  Know whether  or not you need emergency care.  Stay with you until you are better.  General instructions  Contact your doctor each time you have a seizure.  Avoid anything that gives you seizures.  Keep a seizure diary. Write down: ? What you think caused each seizure. ? What you remember about each seizure.  Keep all follow-up visits as told by your doctor. This is important. Contact a doctor if:  You have another seizure.  You have seizures more often.  There is any change in what happens during  your seizures.  You keep having seizures with treatment.  You have symptoms of being sick or having an infection. Get help right away if:  You have a seizure that: ? Lasts longer than 5 minutes. ? Is different than seizures you had before. ? Makes it harder to breathe. ? Happens after you hurt your head.  You have any of these symptoms after a seizure: ? Not being able to speak. ? Not being able to use a part of your body. ? Confusion. ? A bad headache.  You have two or more seizures in a row.  You do not wake up right after a seizure.  You get hurt during a seizure. These symptoms may be an emergency. Do not wait to see if the symptoms will go away. Get medical help right away. Call your local emergency services (911 in the [place] Do not drive yourself to the hospital. Summary  Seizures usually last from 30 seconds to 2 minutes. Usually, they are not harmful unless they last a long time.  Do not eat or drink anything that may keep your medicine from working, such as alcohol.  Teach friends and family what to do when you have a seizure.  Contact your doctor each time you have a seizure. This information is not intended to replace advice given to you by your health care provider. Make sure you discuss any questions you have with your health care provider. Document Revised: 08/29/2018 Document Reviewed: 08/29/2018 Elsevier Patient Education  [place].

## 2019-08-25 LAB — BMP8+EGFR
BUN/Creatinine Ratio: 21 (ref 12–28)
BUN: 21 mg/dL (ref 8–27)
CO2: 24 mmol/L (ref 20–29)
Calcium: 9.5 mg/dL (ref 8.7–10.3)
Chloride: 100 mmol/L (ref 96–106)
Creatinine, Ser: 0.99 mg/dL (ref 0.57–1.00)
GFR calc Af Amer: 66 mL/min/{1.73_m2} (ref >59)
GFR calc non Af Amer: 58 mL/min/{1.73_m2} — ABNORMAL LOW (ref >59)
Glucose: 155 mg/dL — ABNORMAL HIGH (ref 65–99)
Potassium: 4 mmol/L (ref 3.5–5.2)
Sodium: 141 mmol/L (ref 134–144)

## 2019-08-25 LAB — HEMOGLOBIN A1C
Est. average glucose Bld gHb Est-mCnc: 183 mg/dL
Hgb A1c MFr Bld: 8 % — ABNORMAL HIGH (ref 4.8–5.6)

## 2019-08-25 LAB — LAMOTRIGINE LEVEL: Lamotrigine Lvl: 6.8 ug/mL (ref 2.0–20.0)

## 2019-08-31 ENCOUNTER — Telehealth: Payer: Self-pay

## 2019-09-02 ENCOUNTER — Other Ambulatory Visit: Payer: Self-pay

## 2019-09-02 ENCOUNTER — Ambulatory Visit (INDEPENDENT_AMBULATORY_CARE_PROVIDER_SITE_OTHER): Payer: Medicare HMO | Admitting: Pharmacist

## 2019-09-02 ENCOUNTER — Telehealth: Payer: Self-pay

## 2019-09-02 ENCOUNTER — Ambulatory Visit: Payer: Self-pay

## 2019-09-02 DIAGNOSIS — Z794 Long term (current) use of insulin: Secondary | ICD-10-CM

## 2019-09-02 DIAGNOSIS — E1122 Type 2 diabetes mellitus with diabetic chronic kidney disease: Secondary | ICD-10-CM

## 2019-09-02 DIAGNOSIS — I5032 Chronic diastolic (congestive) heart failure: Secondary | ICD-10-CM

## 2019-09-02 DIAGNOSIS — I110 Hypertensive heart disease with heart failure: Secondary | ICD-10-CM

## 2019-09-02 MED ORDER — LEVETIRACETAM 500 MG PO TABS: 500.0000 mg | ORAL_TABLET | Freq: Two times a day (BID) | ORAL | 0 refills | Status: DC

## 2019-09-02 NOTE — Patient Instructions (Signed)
Social Worker Visit Information  Goals we discussed today:  Goals Addressed            This Visit's Progress   . Collaborate with RN Case Manager and PharmD to assist with care coordination needs in response to recent ED visit   Not on track    Current Barriers:  . Care management and care coordination needs related to DMII and Seizure Disorder  which resulted in emergency department visit  Clinical Social Work Clinical Goal(s):  Marland Kitchen Over the next 30 days, patient will collaborate with care management team to assist with care coordination needs in response to recent emergency department visit  CCM SW Interventions: Completed 09/02/19 with the patient . Outbound call placed to the patient to assist with care coordination needs . Reviewed BP readings for the month of March. The patient reports ranges from 128/62 - 176/77 with today's reading as 172/78 o Determined the patient "felt faint yesterday", reported  BP of 160/75 o The patient has yet to fill [place] stating the pharmacy does not have medication on file o Discussed opportunity to schedule a same day visit with the patients primary provider - patient declined o Advised the patient SW would request orders for Keppra be sent to patients preferred pharmacy - Patient reported "I haven't had any seizures so I don't think I need to take it"  - Performed chart review to note [place] neurology appointment scheduled for 3/19 - patient aware of appointment date and time . Collaboration with RN Case Manager and Dr. [name] to advise of BP readings and concerns with medication compliance  Patient Self Care Activities:  . Patient verbalizes understanding of plan to contact her primary care provider if symptoms continue . Calls pharmacy for medication refills . Calls provider office for new concerns or questions . Does not adhere to prescribed medication regimen   Please see past updates related to this goal by clicking on the "Past  Updates" button in the selected goal             Follow Up Plan: SW will follow up with patient by phone over the next month   Daneen Schick, BSW, CDP Social Worker, Certified Dementia Practitioner [place] [facility] [phone]

## 2019-09-02 NOTE — Progress Notes (Signed)
  Chronic Care Management   Outreach Note  09/02/2019 Name: [name] MRN: [id number] DOB: [birth year]  Referred by: [name], MD Reason for referral : Chronic Care Management and Diabetes  An unsuccessful telephone outreach was attempted today. The patient was referred to the case management team for assistance with care management and care coordination.  Recent ED admission for seizures.  Patient has not been taking Keppra.  Refill for generic Keppra was sent in today to [facility] mail order pharmacy for 30-day supply (original RX did not transmit on 08/24/19).  Left patient a VM encouraging return call.  Follow Up Plan: A HIPPA compliant phone message was left for the patient providing contact information and requesting a return call.  The care management team will reach out to the patient again over the next 30 days.   SIGNATURE [name], PharmD, BCPS Clinical Pharmacist, [facility] [place]: [phone]

## 2019-09-02 NOTE — Chronic Care Management (AMB) (Signed)
Chronic Care Management   Follow Up Note   09/02/2019 Name: [name] MRN: [id number] DOB: April 21, 1946  Referred by: [name], MD Reason for referral : Chronic Care Management (Case Collaboration )   [name] is a 74 y.o. year old female who is a primary care patient of [name], MD. The CCM team was consulted for assistance with chronic disease management and care coordination needs.    Review of patient status, including review of consultants reports, relevant laboratory and other test results, and collaboration with appropriate care team members and the patient's provider was performed as part of comprehensive patient evaluation and provision of chronic care management services.    SDOH (Social Determinants of Health) assessments performed: No See Care Plan activities for detailed interventions related to SDOH)   Case Collaboration with embedded BSW Daneen Schick with a status update.     Outpatient Encounter Medications as of 09/02/2019  Medication Sig  . Accu-Chek Softclix Lancets lancets Use as instructed to check blood sugars 2 times per day dx: e11.22  . acetaminophen (TYLENOL) 500 MG tablet Take 500-1,000 mg by mouth every 4 (four) hours as needed for moderate pain.   Marland Kitchen albuterol (VENTOLIN HFA) 108 (90 Base) MCG/ACT inhaler Inhale 1-2 puffs into the lungs every 6 (six) hours as needed for wheezing or shortness of breath.  Marland Kitchen atorvastatin (LIPITOR) 10 MG tablet Take 10 mg by mouth every evening.  . B Complex-C (B-COMPLEX WITH VITAMIN C) tablet Take 1 tablet by mouth daily.  . Blood Glucose Calibration (ACCU-CHEK AVIVA) SOLN Use as directed dx: e11.22  . Blood Glucose Monitoring Suppl (ACCU-CHEK AVIVA PLUS) w/Device KIT Use as directed to check blood sugars 2 times per day dx: e11.22  . carvedilol (COREG) 25 MG tablet Take 1 tablet (25 mg total) by mouth 2 (two) times daily with a meal.  . Cholecalciferol (VITAMIN D-3) 1000 units CAPS Take 1,000 Units by  mouth daily.  . diclofenac sodium (VOLTAREN) 1 % GEL Apply 2 g topically 4 (four) times daily as needed (pain).  . DROPLET PEN NEEDLES 31G X 8 MM MISC USE DAILY WITH VICTOZA AND/OR NOVOLOG.   . furosemide (LASIX) 40 MG tablet TAKE ONE TABLET BY MOUTH SATURDAY AND SUNDAY  . furosemide (LASIX) 80 MG tablet TAKE ONE TABLET BY MOUTH DAILY  MONDAY THRU FRIDAY  . glucose blood (ACCU-CHEK AVIVA PLUS) test strip Use as instructed to check blood sugars 2 times per day dx: e11.22  . hydrALAZINE (APRESOLINE) 100 MG tablet Take 1 tablet (100 mg total) by mouth 2 (two) times daily.  . hydrocortisone cream 1 % Apply 1 application topically daily as needed for itching.  . Insulin Glargine, 1 Unit Dial, (TOUJEO SOLOSTAR) 300 UNIT/ML SOPN Inject 36 Units into the skin at bedtime. (Patient taking differently: Inject 34 Units into the skin at bedtime. )  . insulin lispro (HUMALOG) 100 UNIT/ML injection Inject 2-4 Units into the skin 3 (three) times daily as needed (high blood sugar over 150).  . Isopropyl Alcohol (ALCOHOL WIPES) 70 % MISC Apply 1 each topically 2 (two) times daily.  . isosorbide mononitrate (IMDUR) 60 MG 24 hr tablet Take 1 tablet (60 mg total) by mouth daily.  Marland Kitchen lamoTRIgine (LAMICTAL) 150 MG tablet Take 1 tablet (150 mg total) by mouth 2 (two) times daily.  Marland Kitchen levETIRAcetam (KEPPRA) 500 MG tablet Take 1 tablet (500 mg total) by mouth 2 (two) times daily.  . Menthol, Topical Analgesic, (STOPAIN ROLL-ON EX) Apply 1  application topically daily as needed (pain).  Marland Kitchen metolazone (ZAROXOLYN) 2.5 MG tablet Take 2.5 mg by mouth See admin instructions. Take 2.5 mg by mouth Monday through Friday as needed if weight is greater or equal to 269 lbs  . Multiple Vitamins-Minerals (MULTIVITAMIN WITH MINERALS) tablet Take 1 tablet by mouth daily.  Marland Kitchen neomycin-bacitracin-polymyxin (NEOSPORIN) ointment Apply 1 application topically as needed for wound care.  Marland Kitchen OZEMPIC, 1 MG/DOSE, 2 MG/1.5ML SOPN INJECT 1MG INTO THE SKIN  ONCE A WEEK  . Pancrelipase, Lip-Prot-Amyl, (ZENPEP) 25000-79000 units CPEP Take 1-2 capsules by mouth See admin instructions. Take 2 capsules in the morning, 1 capsule with lunch, and 2 capsules in the evening.  . potassium chloride SA (K-DUR) 20 MEQ tablet TAKE 2 TABLETS ONCE DAILY, BUT ON THE DAY YOU  TAKE METOLAZONE TAKE AN EXTRA TABLET FOR A TOTAL OF 3 TABLETS (Patient taking differently: Take 40-60 mEq by mouth See admin instructions. Take 40 meq daily, when taking metolazone take a total of 60 meq daily)  . sertraline (ZOLOFT) 25 MG tablet Take 25 mg by mouth See admin instructions. Take with 50 mg to equal 75 mg at bedtime  . sertraline (ZOLOFT) 50 MG tablet TAKE 1 TABLET EVERY DAY  . Tolnaftate (TINACTIN EX) Apply 1 application topically daily as needed (foot fungus).   No facility-administered encounter medications on file as of 09/02/2019.     Objective: Lab Results  Component Value Date   HGBA1C 8.0 (H) 08/24/2019   HGBA1C 7.5 (H) 05/19/2019   HGBA1C 8.5 (H) 02/24/2019   Lab Results  Component Value Date   MICROALBUR 10 10/22/2018   LDLCALC 65 10/22/2018   CREATININE 0.99 08/24/2019   BP Readings from Last 3 Encounters:  08/01/19 (!) 151/77  07/23/19 (!) 125/49  06/24/19 127/67     Goals Addressed    . COMPLETED: Assist with Chronic Care Management and Care Coordination needs       Current Barriers:  Marland Kitchen Knowledge Barriers related to resources and support available to address needs related to Chronic Care Management and Care Coordination needs  Case Manager Clinical Goal(s):  Marland Kitchen Over the next 30 days, patient will work with the CCM team to address needs related to Chronic Care Management and Care Coordination needs  Interventions:  . Collaborated with BSW and initiated plan of care to address needs related to Chronic Care Management   Patient Self Care Activities:  . Self administers medications as prescribed . Performs ADL's independently  Initial goal  documentation     . Post Discharge Case Collaboration       CARE PLAN ENTRY (see longtitudinal plan of care for additional care plan information)  Current Barriers:  . Chronic Disease Management support and education needs related to chronic conditions   Nurse Case Manager Clinical Goal(s):  Marland Kitchen Over the next 30 days, patient will work with the [place] CM to address needs related to disease education and support to improve Self Health management of chronic conditions  Interventions:  . Case Collaboration with embedded BSW Kendra Humble regarding patient's post d/c status with BP and prescribed Seizure medication  Patient Self Care Activities:  . Self administers medications as prescribed . Attends all scheduled provider appointments . Calls pharmacy for medication refills . Calls provider office for new concerns or questions  Initial goal documentation        Plan:   Telephone follow up appointment with care management team member scheduled for: 09/07/19  Barb Merino, RN, BSN, CCM  Care Management Coordinator [facility]  Direct Phone: [phone]

## 2019-09-02 NOTE — Chronic Care Management (AMB) (Signed)
Chronic Care Management    Social Work Follow Up Note  09/02/2019 Name: [name] MRN: [id number] DOB: 11-25-1945  [name] is a 74 y.o. year old female who is a primary care patient of [name], MD. The CCM team was consulted for assistance with care coordination.   Review of patient status, including review of consultants reports, other relevant assessments, and collaboration with appropriate care team members and the patient's provider was performed as part of comprehensive patient evaluation and provision of chronic care management services.    SDOH (Social Determinants of Health) assessments performed: No    Outpatient Encounter Medications as of 09/02/2019  Medication Sig  . Accu-Chek Softclix Lancets lancets Use as instructed to check blood sugars 2 times per day dx: e11.22  . acetaminophen (TYLENOL) 500 MG tablet Take 500-1,000 mg by mouth every 4 (four) hours as needed for moderate pain.   Marland Kitchen albuterol (VENTOLIN HFA) 108 (90 Base) MCG/ACT inhaler Inhale 1-2 puffs into the lungs every 6 (six) hours as needed for wheezing or shortness of breath.  Marland Kitchen atorvastatin (LIPITOR) 10 MG tablet Take 10 mg by mouth every evening.  . B Complex-C (B-COMPLEX WITH VITAMIN C) tablet Take 1 tablet by mouth daily.  . Blood Glucose Calibration (ACCU-CHEK AVIVA) SOLN Use as directed dx: e11.22  . Blood Glucose Monitoring Suppl (ACCU-CHEK AVIVA PLUS) w/Device KIT Use as directed to check blood sugars 2 times per day dx: e11.22  . carvedilol (COREG) 25 MG tablet Take 1 tablet (25 mg total) by mouth 2 (two) times daily with a meal.  . Cholecalciferol (VITAMIN D-3) 1000 units CAPS Take 1,000 Units by mouth daily.  . diclofenac sodium (VOLTAREN) 1 % GEL Apply 2 g topically 4 (four) times daily as needed (pain).  . DROPLET PEN NEEDLES 31G X 8 MM MISC USE DAILY WITH VICTOZA AND/OR NOVOLOG.   . furosemide (LASIX) 40 MG tablet TAKE ONE TABLET BY MOUTH SATURDAY AND SUNDAY  . furosemide (LASIX)  80 MG tablet TAKE ONE TABLET BY MOUTH DAILY  MONDAY THRU FRIDAY  . glucose blood (ACCU-CHEK AVIVA PLUS) test strip Use as instructed to check blood sugars 2 times per day dx: e11.22  . hydrALAZINE (APRESOLINE) 100 MG tablet Take 1 tablet (100 mg total) by mouth 2 (two) times daily.  . hydrocortisone cream 1 % Apply 1 application topically daily as needed for itching.  . Insulin Glargine, 1 Unit Dial, (TOUJEO SOLOSTAR) 300 UNIT/ML SOPN Inject 36 Units into the skin at bedtime. (Patient taking differently: Inject 34 Units into the skin at bedtime. )  . insulin lispro (HUMALOG) 100 UNIT/ML injection Inject 2-4 Units into the skin 3 (three) times daily as needed (high blood sugar over 150).  . Isopropyl Alcohol (ALCOHOL WIPES) 70 % MISC Apply 1 each topically 2 (two) times daily.  . isosorbide mononitrate (IMDUR) 60 MG 24 hr tablet Take 1 tablet (60 mg total) by mouth daily.  Marland Kitchen lamoTRIgine (LAMICTAL) 150 MG tablet Take 1 tablet (150 mg total) by mouth 2 (two) times daily.  Marland Kitchen levETIRAcetam (KEPPRA) 500 MG tablet Take 1 tablet (500 mg total) by mouth 2 (two) times daily.  . Menthol, Topical Analgesic, (STOPAIN ROLL-ON EX) Apply 1 application topically daily as needed (pain).  Marland Kitchen metolazone (ZAROXOLYN) 2.5 MG tablet Take 2.5 mg by mouth See admin instructions. Take 2.5 mg by mouth Monday through Friday as needed if weight is greater or equal to 269 lbs  . Multiple Vitamins-Minerals (MULTIVITAMIN WITH MINERALS) tablet  Take 1 tablet by mouth daily.  Marland Kitchen neomycin-bacitracin-polymyxin (NEOSPORIN) ointment Apply 1 application topically as needed for wound care.  Marland Kitchen OZEMPIC, 1 MG/DOSE, 2 MG/1.5ML SOPN INJECT 1MG INTO THE SKIN ONCE A WEEK  . Pancrelipase, Lip-Prot-Amyl, (ZENPEP) 25000-79000 units CPEP Take 1-2 capsules by mouth See admin instructions. Take 2 capsules in the morning, 1 capsule with lunch, and 2 capsules in the evening.  . potassium chloride SA (K-DUR) 20 MEQ tablet TAKE 2 TABLETS ONCE DAILY, BUT ON THE  DAY YOU  TAKE METOLAZONE TAKE AN EXTRA TABLET FOR A TOTAL OF 3 TABLETS (Patient taking differently: Take 40-60 mEq by mouth See admin instructions. Take 40 meq daily, when taking metolazone take a total of 60 meq daily)  . sertraline (ZOLOFT) 25 MG tablet Take 25 mg by mouth See admin instructions. Take with 50 mg to equal 75 mg at bedtime  . sertraline (ZOLOFT) 50 MG tablet TAKE 1 TABLET EVERY DAY  . Tolnaftate (TINACTIN EX) Apply 1 application topically daily as needed (foot fungus).   No facility-administered encounter medications on file as of 09/02/2019.     Goals Addressed            This Visit's Progress   . Collaborate with RN Case Manager and PharmD to assist with care coordination needs in response to recent ED visit   Not on track    Current Barriers:  . Care management and care coordination needs related to DMII and Seizure Disorder  which resulted in emergency department visit  Clinical Social Work Clinical Goal(s):  Marland Kitchen Over the next 30 days, patient will collaborate with care management team to assist with care coordination needs in response to recent emergency department visit  CCM SW Interventions: Completed 09/02/19 with the patient . Outbound call placed to the patient to assist with care coordination needs . Reviewed BP readings for the month of March. The patient reports ranges from 128/62 - 176/77 with today's reading as 172/78 o Determined the patient "felt faint yesterday", reported  BP of 160/75 o The patient has yet to fill [place] stating the pharmacy does not have medication on file o Discussed opportunity to schedule a same day visit with the patients primary provider - patient declined o Advised the patient SW would request orders for Keppra be sent to patients preferred pharmacy - Patient reported "I haven't had any seizures so I don't think I need to take it"  - Performed chart review to note [place] neurology appointment scheduled for 3/19 - patient aware  of appointment date and time . Collaboration with RN Case Manager and Dr. [name] to advise of BP readings and concerns with medication compliance  Patient Self Care Activities:  . Patient verbalizes understanding of plan to contact her primary care provider if symptoms continue . Calls pharmacy for medication refills . Calls provider office for new concerns or questions . Does not adhere to prescribed medication regimen   Please see past updates related to this goal by clicking on the "Past Updates" button in the selected goal             Follow Up Plan: SW will follow up with patient by phone over the next month   Daneen Schick, BSW, CDP Social Worker, Certified Dementia Practitioner [place] [facility] [place] [facility] [phone]  Total time spent performing care coordination and/or care management activities with the patient by phone or face to face = 17 minutes.

## 2019-09-07 ENCOUNTER — Ambulatory Visit: Payer: Self-pay

## 2019-09-07 ENCOUNTER — Telehealth: Payer: Self-pay

## 2019-09-07 ENCOUNTER — Other Ambulatory Visit: Payer: Self-pay

## 2019-09-07 DIAGNOSIS — G40909 Epilepsy, unspecified, not intractable, without status epilepticus: Secondary | ICD-10-CM

## 2019-09-07 DIAGNOSIS — N182 Chronic kidney disease, stage 2 (mild): Secondary | ICD-10-CM | POA: Diagnosis not present

## 2019-09-07 DIAGNOSIS — E1122 Type 2 diabetes mellitus with diabetic chronic kidney disease: Secondary | ICD-10-CM | POA: Diagnosis not present

## 2019-09-07 DIAGNOSIS — I110 Hypertensive heart disease with heart failure: Secondary | ICD-10-CM | POA: Diagnosis not present

## 2019-09-07 DIAGNOSIS — I5032 Chronic diastolic (congestive) heart failure: Secondary | ICD-10-CM

## 2019-09-07 DIAGNOSIS — Z794 Long term (current) use of insulin: Secondary | ICD-10-CM | POA: Diagnosis not present

## 2019-09-08 ENCOUNTER — Ambulatory Visit (HOSPITAL_COMMUNITY)
Admission: RE | Admit: 2019-09-08 | Discharge: 2019-09-08 | Disposition: A | Discharge status: Home / Self Care | Payer: Medicare HMO | Source: Ambulatory Visit | Attending: Cardiology | Admitting: Cardiology

## 2019-09-08 ENCOUNTER — Encounter (HOSPITAL_COMMUNITY): Payer: Self-pay | Admitting: Cardiology

## 2019-09-08 ENCOUNTER — Other Ambulatory Visit: Payer: Self-pay

## 2019-09-08 VITALS — BP 140/60 | HR 62 | Wt 221.6 lb

## 2019-09-08 DIAGNOSIS — E119 Type 2 diabetes mellitus without complications: Secondary | ICD-10-CM | POA: Insufficient documentation

## 2019-09-08 DIAGNOSIS — Z8673 Personal history of transient ischemic attack (TIA), and cerebral infarction without residual deficits: Secondary | ICD-10-CM | POA: Insufficient documentation

## 2019-09-08 DIAGNOSIS — F329 Major depressive disorder, single episode, unspecified: Secondary | ICD-10-CM | POA: Insufficient documentation

## 2019-09-08 DIAGNOSIS — I5032 Chronic diastolic (congestive) heart failure: Secondary | ICD-10-CM | POA: Diagnosis not present

## 2019-09-08 DIAGNOSIS — G4733 Obstructive sleep apnea (adult) (pediatric): Secondary | ICD-10-CM | POA: Diagnosis not present

## 2019-09-08 DIAGNOSIS — Z794 Long term (current) use of insulin: Secondary | ICD-10-CM | POA: Diagnosis not present

## 2019-09-08 DIAGNOSIS — I272 Pulmonary hypertension, unspecified: Secondary | ICD-10-CM | POA: Diagnosis not present

## 2019-09-08 DIAGNOSIS — Z7901 Long term (current) use of anticoagulants: Secondary | ICD-10-CM | POA: Insufficient documentation

## 2019-09-08 DIAGNOSIS — Z87891 Personal history of nicotine dependence: Secondary | ICD-10-CM | POA: Diagnosis not present

## 2019-09-08 DIAGNOSIS — G40909 Epilepsy, unspecified, not intractable, without status epilepticus: Secondary | ICD-10-CM | POA: Diagnosis not present

## 2019-09-08 DIAGNOSIS — R002 Palpitations: Secondary | ICD-10-CM | POA: Insufficient documentation

## 2019-09-08 DIAGNOSIS — I110 Hypertensive heart disease with heart failure: Secondary | ICD-10-CM | POA: Insufficient documentation

## 2019-09-08 DIAGNOSIS — Z79899 Other long term (current) drug therapy: Secondary | ICD-10-CM | POA: Diagnosis not present

## 2019-09-08 DIAGNOSIS — R42 Dizziness and giddiness: Secondary | ICD-10-CM | POA: Diagnosis not present

## 2019-09-08 NOTE — Patient Instructions (Addendum)
You will be contacted by Chantel CMA regarding scheduleing Cardiomems planning.      Your physician has recommended that you have a pulmonary function test. Pulmonary Function Tests are a group of tests that measure how well air moves in and out of your lungs.   Your physician recommends that you schedule a follow-up appointment in: 3 month with Dr [name]   Please call office at [phone] option 2 if you have any questions or concerns.    Please call office at [phone] option 2 if you have any questions or concerns.   At the [facility], you and your health needs are our priority. As part of our continuing mission to provide you with exceptional heart care, we have created designated Provider Care Teams. These Care Teams include your primary Cardiologist (physician) and Advanced Practice Providers (APPs- Physician Assistants and Nurse Practitioners) who all work together to provide you with the care you need, when you need it.   You may see any of the following providers on your designated Care Team at your next follow up: Marland Kitchen Dr Glori Bickers . Dr Loralie Champagne . Darrick Grinder, NP . Lyda Jester, PA . Audry Riles, PharmD   Please be sure to bring in all your medications bottles to every appointment.

## 2019-09-09 ENCOUNTER — Other Ambulatory Visit: Payer: Self-pay

## 2019-09-09 MED ORDER — INSULIN LISPRO 100 UNIT/ML ~~LOC~~ SOLN: 2.0000 [IU] | Freq: Three times a day (TID) | SUBCUTANEOUS | 2 refills | Status: DC | PRN

## 2019-09-09 NOTE — Progress Notes (Signed)
PCP: [name], MD Cardiology: Dr. Ellyn Hack HF Cardiology: Dr. [name]  74 y.o. with history of OHS/OSA and RV dysfunction was referred by Dr. Ellyn Hack for evaluation of CHF.  She has a history of OSA/OHS.  She uses CPAP at night but not currently using oxygen during the day.  Remote smoker.  She had an echo in 9/20 showing EF 50-55% but moderate-severe RV dysfunction with mild-moderate RV dilation.  RHC/LHC in 10/20 showed normal coronaries, pulmonary venous hypertension.  V/Q scan in 2/21 did not show evidence for acute on chronic PE.   She returns for followup of CHF.  She is short of breath walking around her house. She has 3 pillow orthopnea chronically.  Rare atypical chest pain.  Occasional orthostatic-type lightheadedness but mild.  Son is getting married on Saturday.  Weight is down 2 lbs.   Labs (10/20): K 4.1, creatinine 1.15, hgb 11.4  PMH: 1. OHS/OSA: She uses CPAP.  2. H/o CVA 3. Seizure disorder 4. Type 2 DM 5. Depression 6. HTN 7. Hyperlipidemia 8. Chronic diastolic CHF: Echo (0/34) with EF 50-55%, moderate to severe RV dilation with mild-moderately decreased RV systolic function.  - RHC/LHC (10/20): normal coronaries; mean RA 10, PA 56/14 mean 31, mean PCWP 13, LVEDP 18, CO/CI 7.35/3.73, PVR 2.44.  9. Primarily pulmonary venous hypertension.  - V/Q scan (2/21): No evidence for acute or chronic PE.   Social History   Socioeconomic History  . Marital status: Divorced    Spouse name: Not on file  . Number of children: Not on file  . Years of education: Not on file  . Highest education level: Not on file  Occupational History  . Occupation: Disabled    Comment: CNA  Tobacco Use  . Smoking status: Former Smoker    Packs/day: 0.50    Years: 25.00    Pack years: 12.50    Types: Cigarettes  . Smokeless tobacco: Never Used  . Tobacco comment: quit smoking 20+ytrs ago  Substance and Sexual Activity  . Alcohol use: No  . Drug use: No  . Sexual activity: Not  Currently    Birth control/protection: Surgical  Other Topics Concern  . Not on file  Social History Narrative   She lives in [place]. She has lots of family in the area and is accompanied by her sister.   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   She has a distant history of smoking, quit 20 years ago.   Social Determinants of Health   Financial Resource Strain: Low Risk   . Difficulty of Paying Living Expenses: Not hard at all  Food Insecurity: No Food Insecurity  . Worried About Charity fundraiser in the Last Year: Never true  . Ran Out of Food in the Last Year: Never true  Transportation Needs: No Transportation Needs  . Lack of Transportation (Medical): No  . Lack of Transportation (Non-Medical): No  Physical Activity: Insufficiently Active  . Days of Exercise per Week: 2 days  . Minutes of Exercise per Session: 30 min  Stress: No Stress Concern Present  . Feeling of Stress : Not at all  Social Connections:   . Frequency of Communication with Friends and Family:   . Frequency of Social Gatherings with Friends and Family:   . Attends Religious Services:   . Active Member of Clubs or Organizations:   . Attends Archivist Meetings:   Marland Kitchen Marital Status:   Intimate Partner Violence: Not  At Risk  . Fear of Current or Ex-Partner: No  . Emotionally Abused: No  . Physically Abused: No  . Sexually Abused: No   Family History  Problem Relation Age of Onset  . Allergies Father   . Heart disease Father        before 60  . Heart failure Father   . Hypertension Father   . Hyperlipidemia Father   . Heart disease Mother   . Diabetes Mother   . Hypertension Mother   . Hyperlipidemia Mother   . Hypertension Sister   . Heart disease Sister        before 55  . Hyperlipidemia Sister   . Diabetes Brother   . Hypertension Brother   . Diabetes Sister   . Hypertension Sister   . Diabetes Son   . Hypertension Son   . Cancer Other     Current Outpatient Medications  Medication Sig Dispense Refill  . Accu-Chek Softclix Lancets lancets Use as instructed to check blood sugars 2 times per day dx: e11.22 300 each 3  . acetaminophen (TYLENOL) 500 MG tablet Take 500-1,000 mg by mouth every 4 (four) hours as needed for moderate pain.     Marland Kitchen albuterol (VENTOLIN HFA) 108 (90 Base) MCG/ACT inhaler Inhale 1-2 puffs into the lungs every 6 (six) hours as needed for wheezing or shortness of breath.    Marland Kitchen atorvastatin (LIPITOR) 10 MG tablet Take 10 mg by mouth every evening.    . B Complex-C (B-COMPLEX WITH VITAMIN C) tablet Take 1 tablet by mouth daily.    . Blood Glucose Calibration (ACCU-CHEK AVIVA) SOLN Use as directed dx: e11.22 1 each 3  . Blood Glucose Monitoring Suppl (ACCU-CHEK AVIVA PLUS) w/Device KIT Use as directed to check blood sugars 2 times per day dx: e11.22 1 kit 3  . carvedilol (COREG) 25 MG tablet Take 1 tablet (25 mg total) by mouth 2 (two) times daily with a meal. 180 tablet 3  . Cholecalciferol (VITAMIN D-3) 1000 units CAPS Take 1,000 Units by mouth daily.    . diclofenac sodium (VOLTAREN) 1 % GEL Apply 2 g topically 4 (four) times daily as needed (pain).    . DROPLET PEN NEEDLES 31G X 8 MM MISC USE DAILY WITH VICTOZA AND/OR NOVOLOG.  400 each 1  . furosemide (LASIX) 40 MG tablet TAKE ONE TABLET BY MOUTH SATURDAY AND SUNDAY 24 tablet 0  . furosemide (LASIX) 80 MG tablet TAKE ONE TABLET BY MOUTH DAILY  MONDAY THRU FRIDAY 60 tablet 11  . glucose blood (ACCU-CHEK AVIVA PLUS) test strip Use as instructed to check blood sugars 2 times per day dx: e11.22 300 each 3  . hydrALAZINE (APRESOLINE) 100 MG tablet Take 1 tablet (100 mg total) by mouth 2 (two) times daily. 180 tablet 2  . hydrocortisone cream 1 % Apply 1 application topically daily as needed for itching.    . insulin glargine, 1 Unit Dial, (TOUJEO) 300 UNIT/ML Solostar Pen Inject 34 Units into the skin at bedtime.    . Isopropyl Alcohol (ALCOHOL WIPES) 70 % MISC  Apply 1 each topically 2 (two) times daily. 300 each 3  . isosorbide mononitrate (IMDUR) 60 MG 24 hr tablet Take 1 tablet (60 mg total) by mouth daily. 90 tablet 0  . lamoTRIgine (LAMICTAL) 150 MG tablet Take 1 tablet (150 mg total) by mouth 2 (two) times daily. 180 tablet 3  . levETIRAcetam (KEPPRA) 500 MG tablet Take 1 tablet (500 mg total) by mouth 2 (  two) times daily. 60 tablet 0  . Menthol, Topical Analgesic, (STOPAIN ROLL-ON EX) Apply 1 application topically daily as needed (pain).    Marland Kitchen metolazone (ZAROXOLYN) 2.5 MG tablet Take 2.5 mg by mouth See admin instructions. Take 2.5 mg by mouth Monday through Friday as needed if weight is greater or equal to 269 lbs    . Multiple Vitamins-Minerals (MULTIVITAMIN WITH MINERALS) tablet Take 1 tablet by mouth daily.    Marland Kitchen neomycin-bacitracin-polymyxin (NEOSPORIN) ointment Apply 1 application topically as needed for wound care.    Marland Kitchen OZEMPIC, 1 MG/DOSE, 2 MG/1.5ML SOPN INJECT 1MG INTO THE SKIN ONCE A WEEK 12 mL 0  . Pancrelipase, Lip-Prot-Amyl, (ZENPEP) 25000-79000 units CPEP Take 1-2 capsules by mouth See admin instructions. Take 2 capsules in the morning, 1 capsule with lunch, and 2 capsules in the evening.    . potassium chloride SA (KLOR-CON) 20 MEQ tablet Take 40 meq daily, when taking metolazone take a total of 60 meq daily    . sertraline (ZOLOFT) 25 MG tablet Take 25 mg by mouth See admin instructions. Take with 50 mg to equal 75 mg at bedtime    . sertraline (ZOLOFT) 50 MG tablet TAKE 1 TABLET EVERY DAY 90 tablet 2  . Tolnaftate (TINACTIN EX) Apply 1 application topically daily as needed (foot fungus).    . insulin lispro (HUMALOG) 100 UNIT/ML injection Inject 0.02-0.04 mLs (2-4 Units total) into the skin 3 (three) times daily as needed (high blood sugar over 150). 10 mL 2   No current facility-administered medications for this encounter.   BP 140/60   Pulse 62   Wt 100.5 kg (221 lb 9.6 oz)   SpO2 95%   BMI 41.87 kg/m  General: NAD Neck: No  JVD, no thyromegaly or thyroid nodule.  Lungs: Clear to auscultation bilaterally with normal respiratory effort. CV: Nondisplaced PMI.  Heart regular S1/S2, no S3/S4, no murmur.  No peripheral edema.  No carotid bruit.  Normal pedal pulses.  Abdomen: Soft, nontender, no hepatosplenomegaly, no distention.  Skin: Intact without lesions or rashes.  Neurologic: Alert and oriented x 3.  Psych: Normal affect. Extremities: No clubbing or cyanosis.  HEENT: Normal.   Assessment/Plan: 1. Chronic diastolic CHF/RV failure: Echo in 9/20 showed EF 50-55% with moderate-severe RV dilation and mild-moderately decreased systolic function. RHC/LHC showed mild-moderate pulmonary venous hypertension and no significant coronary disease.  Cause of RV dysfunction is not clear, possibly due to diastolic CHF + OHS/OSA.  V/Q scan in 2/21 did not show acute or chronic PE.  She is not volume overloaded on exam despite NYHA class III symptoms.   - Continue Lasix 80 mg daily. Stable BMET recently.  I do not think that she needs metolazone.  - Would consider cardiac MRI in the future to assess for ARVC.  - We are working on getting her set up for [facility] pressure sensor placement.  2. OHS/OSA: She is using CPAP but not oxygen during the day.  Oxygen saturation not low at rest.  - I am going to arrange for PFTs.  3. Pulmonary hypertension: RHC in 10/20 showed mild-moderate pulmonary venous hypertension.  No role for selective pulmonary vasodilators.  4. Palpitations/lightheadedness: I will try to find the results of her 9/20 event monitor (still not available).   Followup in 3 months. Will place Cardiom[facility] prior.   Loralie Champagne 09/09/2019

## 2019-09-09 NOTE — Chronic Care Management (AMB) (Signed)
Chronic Care Management   Follow Up Note   09/08/2019 Name: [name] MRN: [id number] DOB: [birth year]  Referred by: [name], MD Reason for referral : Chronic Care Management (FU Call - post ED f/u/DM/CHF/Seizures )   [name] is a 74 y.o. year old female who is a primary care patient of [name], MD. The CCM team was consulted for assistance with chronic disease management and care coordination needs.    Review of patient status, including review of consultants reports, relevant laboratory and other test results, and collaboration with appropriate care team members and the patient's provider was performed as part of comprehensive patient evaluation and provision of chronic care management services.    SDOH (Social Determinants of Health) assessments performed: No See Care Plan activities for detailed interventions related to [facility])     Outpatient Encounter Medications as of 09/07/2019  Medication Sig  . Accu-Chek Softclix Lancets lancets Use as instructed to check blood sugars 2 times per day dx: e11.22  . acetaminophen (TYLENOL) 500 MG tablet Take 500-1,000 mg by mouth every 4 (four) hours as needed for moderate pain.   Marland Kitchen albuterol (VENTOLIN HFA) 108 (90 Base) MCG/ACT inhaler Inhale 1-2 puffs into the lungs every 6 (six) hours as needed for wheezing or shortness of breath.  Marland Kitchen atorvastatin (LIPITOR) 10 MG tablet Take 10 mg by mouth every evening.  . B Complex-C (B-COMPLEX WITH VITAMIN C) tablet Take 1 tablet by mouth daily.  . Blood Glucose Calibration (ACCU-CHEK AVIVA) SOLN Use as directed dx: e11.22  . Blood Glucose Monitoring Suppl (ACCU-CHEK AVIVA PLUS) w/Device KIT Use as directed to check blood sugars 2 times per day dx: e11.22  . carvedilol (COREG) 25 MG tablet Take 1 tablet (25 mg total) by mouth 2 (two) times daily with a meal.  . Cholecalciferol (VITAMIN D-3) 1000 units CAPS Take 1,000 Units by mouth daily.  . diclofenac sodium (VOLTAREN) 1 % GEL Apply  2 g topically 4 (four) times daily as needed (pain).  . DROPLET PEN NEEDLES 31G X 8 MM MISC USE DAILY WITH VICTOZA AND/OR NOVOLOG.   . furosemide (LASIX) 40 MG tablet TAKE ONE TABLET BY MOUTH SATURDAY AND SUNDAY  . furosemide (LASIX) 80 MG tablet TAKE ONE TABLET BY MOUTH DAILY  MONDAY THRU FRIDAY  . glucose blood (ACCU-CHEK AVIVA PLUS) test strip Use as instructed to check blood sugars 2 times per day dx: e11.22  . hydrALAZINE (APRESOLINE) 100 MG tablet Take 1 tablet (100 mg total) by mouth 2 (two) times daily.  . hydrocortisone cream 1 % Apply 1 application topically daily as needed for itching.  . insulin lispro (HUMALOG) 100 UNIT/ML injection Inject 2-4 Units into the skin 3 (three) times daily as needed (high blood sugar over 150).  . Isopropyl Alcohol (ALCOHOL WIPES) 70 % MISC Apply 1 each topically 2 (two) times daily.  . isosorbide mononitrate (IMDUR) 60 MG 24 hr tablet Take 1 tablet (60 mg total) by mouth daily.  Marland Kitchen lamoTRIgine (LAMICTAL) 150 MG tablet Take 1 tablet (150 mg total) by mouth 2 (two) times daily.  Marland Kitchen levETIRAcetam (KEPPRA) 500 MG tablet Take 1 tablet (500 mg total) by mouth 2 (two) times daily.  . Menthol, Topical Analgesic, (STOPAIN ROLL-ON EX) Apply 1 application topically daily as needed (pain).  Marland Kitchen metolazone (ZAROXOLYN) 2.5 MG tablet Take 2.5 mg by mouth See admin instructions. Take 2.5 mg by mouth Monday through Friday as needed if weight is greater or equal to 269 lbs  .  Multiple Vitamins-Minerals (MULTIVITAMIN WITH MINERALS) tablet Take 1 tablet by mouth daily.  Marland Kitchen neomycin-bacitracin-polymyxin (NEOSPORIN) ointment Apply 1 application topically as needed for wound care.  Marland Kitchen OZEMPIC, 1 MG/DOSE, 2 MG/1.5ML SOPN INJECT 1MG INTO THE SKIN ONCE A WEEK  . Pancrelipase, Lip-Prot-Amyl, (ZENPEP) 25000-79000 units CPEP Take 1-2 capsules by mouth See admin instructions. Take 2 capsules in the morning, 1 capsule with lunch, and 2 capsules in the evening.  . sertraline (ZOLOFT) 25 MG  tablet Take 25 mg by mouth See admin instructions. Take with 50 mg to equal 75 mg at bedtime  . sertraline (ZOLOFT) 50 MG tablet TAKE 1 TABLET EVERY DAY  . Tolnaftate (TINACTIN EX) Apply 1 application topically daily as needed (foot fungus).  . [DISCONTINUED] Insulin Glargine, 1 Unit Dial, (TOUJEO SOLOSTAR) 300 UNIT/ML SOPN Inject 36 Units into the skin at bedtime. (Patient taking differently: Inject 34 Units into the skin at bedtime. )  . [DISCONTINUED] potassium chloride SA (K-DUR) 20 MEQ tablet TAKE 2 TABLETS ONCE DAILY, BUT ON THE DAY YOU  TAKE METOLAZONE TAKE AN EXTRA TABLET FOR A TOTAL OF 3 TABLETS (Patient taking differently: Take 40-60 mEq by mouth See admin instructions. )   No facility-administered encounter medications on file as of 09/07/2019.     Objective:  Lab Results  Component Value Date   HGBA1C 8.0 (H) 08/24/2019   HGBA1C 7.5 (H) 05/19/2019   HGBA1C 8.5 (H) 02/24/2019   Lab Results  Component Value Date   MICROALBUR 10 10/22/2018   LDLCALC 65 10/22/2018   CREATININE 0.99 08/24/2019   BP Readings from Last 3 Encounters:  09/08/19 140/60  08/01/19 (!) 151/77  07/23/19 (!) 125/49    Goals Addressed      Patient Stated   . COMPLETED: "I have a knot in my stomach" (pt-stated)       Current Barriers:  . Unknown  Clinical Social Work Clinical Goal(s):  Marland Kitchen Over the next 10 days the patient will follow up with her primary provider regarding reported concern.  CCM SW Interventions: Completed 02/06/2019 . Patient interviewed and appropriate assessments performed o The patient reports she was seen in the practice "one to two weeks ago" by her physician due to a painful knot in her stomach. The patient stated she was sent to have an x-ray of problem area "they said I was full of stool so I took magnesium citrate" . Assessed for patients ability to have a BM after administering laxative "I went. I don't have a gallbladder so I never have a problem going" . Determined the  patient has noticed the knot being "less tender" but reports the knot is currently the size of a baseball . Performed chart review, determined the patient was referred for a CT scan of an umbilical hernia on 03/09/568 . Collaboration with patients primary care provider via in basket message to report continued patient concern of having a knot in her stomach . Advised the patient to contact her physicians office to schedule an appointment if symptoms persist . Collaborated with RN Case Manager re: reported knot in the patients stomach . Determined RN Case Manager would place an outreach call to the patient to gather more information and perform a comprehensive telephonic assessment  CCM RN CM Interventions: 02/06/19 call completed with patient  . Evaluation of current treatment plan related to umbilical hernia and patient's adherence to plan as established by provider. . Advised patient to seek medical attention right away for symptoms suggestive of obstruction or strangulation  of the intestine including, nausea, vomiting, severe abdominal pain and tenderness, fever and or worsening "bulging" of the hernia, or if unable to pass gas or have a BM; patient reports having mid umbilical abdominal tenderness only when being pressed on and reports having some random episodes of nausea over the past couple of days; instructed patient on how to access the on call provider for her PCP office if needed after hours/weekend; discussed calling 911 for a medical emergency - patient verbalizes understanding  . Collaborated with Dr. Baird Cancer regarding follow up and or recommendations for GI f/u - patient reports she is established with Gastroenterologist specialist, Dr. Arta Silence  . Discussed plans with patient for ongoing care management follow up and provided patient with direct contact information for care management team . Reviewed scheduled/upcoming provider appointments including: next OV scheduled with Dr.  Baird Cancer on 04/06/19 _0 :71 PM   Patient Self Care Activities:  . Self administers medications as prescribed . Performs ADL's independently  Initial goal documentation     . COMPLETED: "I have to have a procedure on my R hand Monday, 02/09/19" (pt-stated)       Current Barriers:  Marland Kitchen Knowledge Deficits related to treatment management for pain and decreased dexterity of her R hand . Decreased dexterity and ROM to R hand  Nurse Case Manager Clinical Goal(s):  Marland Kitchen Over the next 30 days, patient will report having complete wound healing and improved dexterity and ROM to her R hand . Over the next 60 days, patient have a better understanding of her long term treatment plan for PT/OT to help improve her dexterity and strength of her R hand  CCM RN CM Interventions:  02/06/19 call completed with patient   . Evaluation of current treatment plan related to treatment management and planned procedure to her R hand/thumb and patient's adherence to plan as established by provider . Discussed patient has a planned outpatient procedure to be performed by Orthopedic MD, Dr. Amedeo Plenty, scheduled for 02/09/19; per review of OV with Dr. Amedeo Plenty , noted patient's dx: Osteoarthrosis of the Carpometacarpal Joint of the Thumb; Pain of Right Wrist; Radial Styloid Tenosynovitis . Patient states she was given basic information about the procedure but is unable to verbalize what to expect post procedure . Discussed plans with patient for ongoing care management follow up and provided patient with direct contact information for care management team  Patient Self Care Activities:  . Self administers medications as prescribed . Performs ADL's independently  Initial goal documentation     . Diabetes: "I would like to learn more about how to manage my diabetes" (pt-stated)       Current Barriers:  Marland Kitchen Knowledge Deficits related to disease process and Self Health Management of diabetes . Chronic Disease Management support and  education needs related to DM, CHF, Seizures  Nurse Case Manager Clinical Goal(s):  Marland Kitchen Over the next 60 days, patient will work with the CCM team to address needs related to providing education and ADA recommendations to help lower her A1C and Meal planning using the plate method  Goal Met  . Over the next 90 days patient will lower her A1C < 8.6 Goal Met  . 09/08/19 New Over the next 90 days, patient will maintain blood glucose reading of less than 180 mg/dL; fasting blood glucose levels of less than >80 <130 mg/dL; hemoglobin A1C level <7%  CCM RN CM Interventions:  09/08/19 call completed with patient   . Evaluation of current treatment plan related  to type 2 Diabetes and patient's adherence to plan as established by provider. . Provided education to patient re: current A1C of 8.0; educated patient on target A1C of <7.0; discussed interventions to help achieve goal including adherence to a diabetic friendly diet, implementing exercise in her daily routine and adherence to taking her medications exactly as prescribed . Reviewed medications with patient and discussed patient reports adhering to her prescribed medication regimen for diabetes; reviewed indication, dosage and frequency of prescribed meds, patient denies having noted SE; patient requests a refill for Lispro (Humalog) insulin to adhere to sliding scale . Collaborated with PCP via in basket message  regarding patient's need for Humalog insulin refill  . Discussed plans with patient for ongoing care management follow up and provided patient with direct contact information for care management team . Provided patient with printed educational materials related to Meal Planning using the plate, Know Your A1C, s/s of hypo/hyperglycemia, Diabetes Zone Safety Tool  Patient Self Care Activities:  . Self administers medications as prescribed . Performs ADL's independently  Please see past updates related to this goal by clicking on the "Past  Updates" button in the selected goal      . Seizures: "I need to clarify with my doctor about my Seizure meds" (pt-stated)       [name] (see longtitudinal plan of care for additional care plan information)  Current Barriers:  Marland Kitchen Knowledge Deficits related to disease education and support for Seizures . Chronic Disease Management support and education needs related to DM, CHF, Seizures   Nurse Case Manager Clinical Goal(s):  Marland Kitchen Over the next 90 days, patient will work with the [place] CM  to address needs related to disease education and support to improve Self Health management for Seizure disorder . Over the next 90 days, patient will experience decrease in ED visits. ED visits in last 6 months = 1 ED visit, 1 Admission   CCM RN CM Interventions:  09/08/19 call completed with patient  . Evaluation of current treatment plan related to Seizures and patient's adherence to plan as established by provider. . Provided education to patient re: importance of logging all Seizure activity including any important details such as potential triggers, aura's, time of event, etc. . Reviewed medications with patient and discussed patient received her [place] via [facility] order; discussed indication, frequency and dosage of prescribed medications including Keppra and Lamictal; Determined patient is not comfortable taking Keppra until she clarify's with Neurology . Discussed plans with patient for ongoing care management follow up and provided patient with direct contact information for care management team . Reviewed scheduled/upcoming provider appointments including: Neurology, 09/11/19 _0 :00 PM   Patient Self Care Activities:  . Self administers medications as prescribed . Performs ADL's independently  Initial goal documentation       Other   . CHF: Complete daily weights and know when to call the doctor       [place] (see longtitudinal plan of care for additional care plan  information)  Current Barriers:  Marland Kitchen Knowledge Deficits related to disease process and Self Health management of CHF . Chronic Disease Management support and education needs related to CHF, DM, Seizures  Nurse Case Manager Clinical Goal(s):  Marland Kitchen Over the next 90 days, patient will work with the [place] and Cardiologist to address needs related to disease education and support of CHF to improve Self Health management and reduce CHF exacerbation   CCM RN CM Interventions:  09/08/19 call completed with patient  . Evaluation of current treatment plan related to CHF and patient's adherence to plan as established by provider. . Discussed plans with patient for ongoing care management follow up and provided patient with direct contact information for care management team . Provided patient with printed educational materials related to CHF Zone Safety Tool  . Reviewed scheduled/upcoming provider appointments including: 09/08/19 _0 :00 PM Dr. [name] Cardiology   Patient Self Care Activities:  . Self administers medications as prescribed . Performs ADL's independently  Initial goal documentation     . COMPLETED: Collaborate with RN Case Manager and PharmD to assist with care coordination needs in response to recent ED visit       Current Barriers:  . Care management and care coordination needs related to DMII and Seizure Disorder  which resulted in emergency department visit  Clinical Social Work Clinical Goal(s):  Marland Kitchen Over the next 30 days, patient will collaborate with care management team to assist with care coordination needs in response to recent emergency department visit  CCM SW Interventions: Completed 09/02/19 with the patient . Outbound call placed to the patient to assist with care coordination needs . Reviewed BP readings for the month of March. The patient reports ranges from 128/62 - 176/77 with today's reading as 172/78 o Determined the patient "felt faint yesterday", reported  BP of  160/75 o The patient has yet to fill [place] stating the pharmacy does not have medication on file o Discussed opportunity to schedule a same day visit with the patients primary provider - patient declined o Advised the patient SW would request orders for Keppra be sent to patients preferred pharmacy - Patient reported "I haven't had any seizures so I don't think I need to take it"  - Performed chart review to note [employer] [place] neurology appointment scheduled for 3/19 - patient aware of appointment date and time . Collaboration with RN Case Manager and Dr. [name] to advise of BP readings and concerns with medication compliance  Patient Self Care Activities:  . Patient verbalizes understanding of plan to contact her primary care provider if symptoms continue . Calls pharmacy for medication refills . Calls provider office for new concerns or questions . Does not adhere to prescribed medication regimen   Please see past updates related to this goal by clicking on the "Past Updates" button in the selected goal      . COMPLETED: Post Discharge Case Collaboration       CARE PLAN ENTRY (see longtitudinal plan of care for additional care plan information)  Current Barriers:  . Chronic Disease Management support and education needs related to chronic conditions   Nurse Case Manager Clinical Goal(s):  Marland Kitchen Over the next 30 days, patient will work with the [place] CM to address needs related to disease education and support to improve Self Health management of chronic conditions  Interventions:  . Case Collaboration with embedded BSW Kendra Humble regarding patient's post d/c status with BP and prescribed Seizure medication  Patient Self Care Activities:  . Self administers medications as prescribed . Attends all scheduled provider appointments . Calls pharmacy for medication refills . Calls provider office for new concerns or questions  Initial goal documentation         Plan:    Telephone follow up appointment with care management team member scheduled for: 09/22/19  Barb Merino, RN, BSN, CCM Care Management Coordinator [place] [facility]  Direct Phone: [phone]

## 2019-09-09 NOTE — Patient Instructions (Signed)
Visit Information  Goals Addressed      Patient Stated   . COMPLETED: "I have a knot in my stomach" (pt-stated)       Current Barriers:  . Unknown  Clinical Social Work Clinical Goal(s):  Marland Kitchen Over the next 10 days the patient will follow up with her primary provider regarding reported concern.  CCM SW Interventions: Completed 02/06/2019 . Patient interviewed and appropriate assessments performed o The patient reports she was seen in the practice "one to two weeks ago" by her physician due to a painful knot in her stomach. The patient stated she was sent to have an x-ray of problem area "they said I was full of stool so I took magnesium citrate" . Assessed for patients ability to have a BM after administering laxative "I went. I don't have a gallbladder so I never have a problem going" . Determined the patient has noticed the knot being "less tender" but reports the knot is currently the size of a baseball . Performed chart review, determined the patient was referred for a CT scan of an umbilical hernia on 09/07/4006 . Collaboration with patients primary care provider via in basket message to report continued patient concern of having a knot in her stomach . Advised the patient to contact her physicians office to schedule an appointment if symptoms persist . Collaborated with RN Case Manager re: reported knot in the patients stomach . Determined RN Case Manager would place an outreach call to the patient to gather more information and perform a comprehensive telephonic assessment  CCM RN CM Interventions: 02/06/19 call completed with patient  . Evaluation of current treatment plan related to umbilical hernia and patient's adherence to plan as established by provider. . Advised patient to seek medical attention right away for symptoms suggestive of obstruction or strangulation of the intestine including, nausea, vomiting, severe abdominal pain and tenderness, fever and or worsening "bulging" of  the hernia, or if unable to pass gas or have a BM; patient reports having mid umbilical abdominal tenderness only when being pressed on and reports having some random episodes of nausea over the past couple of days; instructed patient on how to access the on call provider for her PCP office if needed after hours/weekend; discussed calling 911 for a medical emergency - patient verbalizes understanding  . Collaborated with Dr. Baird Cancer regarding follow up and or recommendations for GI f/u - patient reports she is established with Gastroenterologist specialist, Dr. Arta Silence  . Discussed plans with patient for ongoing care management follow up and provided patient with direct contact information for care management team . Reviewed scheduled/upcoming provider appointments including: next OV scheduled with Dr. Baird Cancer on 04/06/19 _0 :33 PM   Patient Self Care Activities:  . Self administers medications as prescribed . Performs ADL's independently  Initial goal documentation     . COMPLETED: "I have to have a procedure on my R hand Monday, 02/09/19" (pt-stated)       Current Barriers:  Marland Kitchen Knowledge Deficits related to treatment management for pain and decreased dexterity of her R hand . Decreased dexterity and ROM to R hand  Nurse Case Manager Clinical Goal(s):  Marland Kitchen Over the next 30 days, patient will report having complete wound healing and improved dexterity and ROM to her R hand . Over the next 60 days, patient have a better understanding of her long term treatment plan for PT/OT to help improve her dexterity and strength of her R hand  CCM RN CM Interventions:  02/06/19 call completed with patient   . Evaluation of current treatment plan related to treatment management and planned procedure to her R hand/thumb and patient's adherence to plan as established by provider . Discussed patient has a planned outpatient procedure to be performed by Orthopedic MD, Dr. Amedeo Plenty, scheduled for 02/09/19; per  review of OV with Dr. Amedeo Plenty , noted patient's dx: Osteoarthrosis of the Carpometacarpal Joint of the Thumb; Pain of Right Wrist; Radial Styloid Tenosynovitis . Patient states she was given basic information about the procedure but is unable to verbalize what to expect post procedure . Discussed plans with patient for ongoing care management follow up and provided patient with direct contact information for care management team  Patient Self Care Activities:  . Self administers medications as prescribed . Performs ADL's independently  Initial goal documentation     . Diabetes: "I would like to learn more about how to manage my diabetes" (pt-stated)       Current Barriers:  Marland Kitchen Knowledge Deficits related to disease process and Self Health Management of diabetes . Chronic Disease Management support and education needs related to DM, CHF, Seizures  Nurse Case Manager Clinical Goal(s):  Marland Kitchen Over the next 60 days, patient will work with the CCM team to address needs related to providing education and ADA recommendations to help lower her A1C and Meal planning using the plate method  Goal Met  . Over the next 90 days patient will lower her A1C < 8.6 Goal Met  . 09/08/19 New Over the next 90 days, patient will maintain blood glucose reading of less than 180 mg/dL; fasting blood glucose levels of less than >80 <130 mg/dL; hemoglobin A1C level <7%  CCM RN CM Interventions:  09/08/19 call completed with patient   . Evaluation of current treatment plan related to type 2 Diabetes and patient's adherence to plan as established by provider. . Provided education to patient re: current A1C of 8.0; educated patient on target A1C of <7.0; discussed interventions to help achieve goal including adherence to a diabetic friendly diet, implementing exercise in her daily routine and adherence to taking her medications exactly as prescribed . Reviewed medications with patient and discussed patient reports adhering to  her prescribed medication regimen for diabetes; reviewed indication, dosage and frequency of prescribed meds, patient denies having noted SE; patient requests a refill for Lispro (Humalog) insulin to adhere to sliding scale . Collaborated with PCP via in basket message  regarding patient's need for Humalog insulin refill  . Discussed plans with patient for ongoing care management follow up and provided patient with direct contact information for care management team . Provided patient with printed educational materials related to Meal Planning using the plate, Know Your A1C, s/s of hypo/hyperglycemia, Diabetes Zone Safety Tool  Patient Self Care Activities:  . Self administers medications as prescribed . Performs ADL's independently  Please see past updates related to this goal by clicking on the "Past Updates" button in the selected goal      . Seizures: "I need to clarify with my doctor about my Seizure meds" (pt-stated)       [place] (see longtitudinal plan of care for additional care plan information)  Current Barriers:  Marland Kitchen Knowledge Deficits related to disease education and support for Seizures . Chronic Disease Management support and education needs related to DM, CHF, Seizures   Nurse Case Manager Clinical Goal(s):  Marland Kitchen Over the next 90 days, patient will work with the [place]  to address needs related to disease education and support to improve Self Health management for Seizure disorder . Over the next 90 days, patient will experience decrease in ED visits. ED visits in last 6 months = 1 ED visit, 1 Admission   CCM RN CM Interventions:  09/08/19 call completed with patient  . Evaluation of current treatment plan related to Seizures and patient's adherence to plan as established by provider. . Provided education to patient re: importance of logging all Seizure activity including any important details such as potential triggers, aura's, time of event, etc. . Reviewed  medications with patient and discussed patient received her [place] via [facility] mail order; discussed indication, frequency and dosage of prescribed medications including Keppra and Lamictal; Determined patient is not comfortable taking Keppra until she clarify's with Neurology . Discussed plans with patient for ongoing care management follow up and provided patient with direct contact information for care management team . Reviewed scheduled/upcoming provider appointments including: Neurology, 09/11/19 _0 :00 PM   Patient Self Care Activities:  . Self administers medications as prescribed . Performs ADL's independently  Initial goal documentation       Other   . CHF: Complete daily weights and know when to call the doctor       [place] (see longtitudinal plan of care for additional care plan information)  Current Barriers:  Marland Kitchen Knowledge Deficits related to disease process and Self Health management of CHF . Chronic Disease Management support and education needs related to CHF, DM, Seizures  Nurse Case Manager Clinical Goal(s):  Marland Kitchen Over the next 90 days, patient will work with the [place] and Cardiologist to address needs related to disease education and support of CHF to improve Self Health management and reduce CHF exacerbation   CCM RN CM Interventions:  09/08/19 call completed with patient  . Evaluation of current treatment plan related to CHF and patient's adherence to plan as established by provider. . Discussed plans with patient for ongoing care management follow up and provided patient with direct contact information for care management team . Provided patient with printed educational materials related to CHF Zone Safety Tool  . Reviewed scheduled/upcoming provider appointments including: 09/08/19 _1 :00 PM Dr. [name] Cardiology   Patient Self Care Activities:  . Self administers medications as prescribed . Performs ADL's independently  Initial goal documentation      . COMPLETED: Collaborate with RN Case Manager and PharmD to assist with care coordination needs in response to recent ED visit       Current Barriers:  . Care management and care coordination needs related to DMII and Seizure Disorder  which resulted in emergency department visit  Clinical Social Work Clinical Goal(s):  Marland Kitchen Over the next 30 days, patient will collaborate with care management team to assist with care coordination needs in response to recent emergency department visit  CCM SW Interventions: Completed 09/02/19 with the patient . Outbound call placed to the patient to assist with care coordination needs . Reviewed BP readings for the month of March. The patient reports ranges from 128/62 - 176/77 with today's reading as 172/78 o Determined the patient "felt faint yesterday", reported  BP of 160/75 o The patient has yet to fill [place] stating the pharmacy does not have medication on file o Discussed opportunity to schedule a same day visit with the patients primary provider - patient declined o Advised the patient SW would request orders for Keppra be sent to patients preferred pharmacy - Patient  reported "I haven't had any seizures so I don't think I need to take it"  - Performed chart review to note [place] neurology appointment scheduled for 3/19 - patient aware of appointment date and time . Collaboration with RN Case Manager and Dr. [name] to advise of BP readings and concerns with medication compliance  Patient Self Care Activities:  . Patient verbalizes understanding of plan to contact her primary care provider if symptoms continue . Calls pharmacy for medication refills . Calls provider office for new concerns or questions . Does not adhere to prescribed medication regimen   Please see past updates related to this goal by clicking on the "Past Updates" button in the selected goal         . COMPLETED: Post Discharge Case Collaboration       CARE PLAN  [place] (see longtitudinal plan of care for additional care plan information)  Current Barriers:  . Chronic Disease Management support and education needs related to chronic conditions   Nurse Case Manager Clinical Goal(s):  Marland Kitchen Over the next 30 days, patient will work with the [place] CM to address needs related to disease education and support to improve Self Health management of chronic conditions  Interventions:  . Case Collaboration with embedded BSW Kendra Humble regarding patient's post d/c status with BP and prescribed Seizure medication  Patient Self Care Activities:  . Self administers medications as prescribed . Attends all scheduled provider appointments . Calls pharmacy for medication refills . Calls provider office for new concerns or questions  Initial goal documentation        Patient verbalizes understanding of instructions provided today.   Telephone follow up appointment with care management team member scheduled for: 09/22/19  Barb Merino, RN, BSN, CCM Care Management Coordinator [facility]  Direct Phone: [phone]

## 2019-09-11 ENCOUNTER — Other Ambulatory Visit: Payer: Self-pay

## 2019-09-11 ENCOUNTER — Encounter: Payer: Self-pay | Admitting: Neurology

## 2019-09-11 ENCOUNTER — Ambulatory Visit (INDEPENDENT_AMBULATORY_CARE_PROVIDER_SITE_OTHER): Payer: Medicare HMO | Admitting: Neurology

## 2019-09-11 VITALS — BP 149/79 | HR 64 | Ht 61.0 in | Wt 220.6 lb

## 2019-09-11 DIAGNOSIS — G40009 Localization-related (focal) (partial) idiopathic epilepsy and epileptic syndromes with seizures of localized onset, not intractable, without status epilepticus: Secondary | ICD-10-CM | POA: Diagnosis not present

## 2019-09-11 DIAGNOSIS — F445 Conversion disorder with seizures or convulsions: Secondary | ICD-10-CM

## 2019-09-11 MED ORDER — LAMOTRIGINE 150 MG PO TABS: 150.0000 mg | ORAL_TABLET | Freq: Two times a day (BID) | ORAL | 3 refills | Status: DC

## 2019-09-11 MED ORDER — LEVETIRACETAM 500 MG PO TABS: 500.0000 mg | ORAL_TABLET | Freq: Two times a day (BID) | ORAL | 3 refills | Status: DC

## 2019-09-11 NOTE — Patient Instructions (Addendum)
1. Start the Keppra 500mg : take 1 tablet twice a day  2. Continue Lamotrigine 150mg  twice a day  3. Follow-up in 6 months, call for any changes  Seizure Precautions: 1. If medication has been prescribed for you to prevent seizures, take it exactly as directed.  Do not stop taking the medicine without talking to your doctor first, even if you have not had a seizure in a long time.   2. Avoid activities in which a seizure would cause danger to yourself or to others.  Don't operate dangerous machinery, swim alone, or climb in high or dangerous places, such as on ladders, roofs, or girders.  Do not drive unless your doctor says you may.  3. If you have any warning that you may have a seizure, lay down in a safe place where you can't hurt yourself.    4.  No driving for 6 months from last seizure, as per [facility].   Please refer to the following link on the [place] website for more information: http://[email or web address]/[email or web address]   5.  Maintain good sleep hygiene. Avoid alcohol.  6.  Contact your doctor if you have any problems that may be related to the medicine you are taking.  7.  Call 911 and bring the patient back to the ED if:        A.  The seizure lasts longer than 5 minutes.       B.  The patient doesn't awaken shortly after the seizure  C.  The patient has new problems such as difficulty seeing, speaking or moving  D.  The patient was injured during the seizure  E.  The patient has a temperature over 102 F (39C)  F.  The patient vomited and now is having trouble breathing
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
## 2019-09-14 ENCOUNTER — Other Ambulatory Visit (HOSPITAL_COMMUNITY): Payer: Medicare HMO

## 2019-09-14 ENCOUNTER — Other Ambulatory Visit: Payer: Self-pay

## 2019-09-14 MED ORDER — INSULIN LISPRO (1 UNIT DIAL) 100 UNIT/ML (KWIKPEN): PEN_INJECTOR | SUBCUTANEOUS | 2 refills | Status: DC

## 2019-09-15 ENCOUNTER — Telehealth: Payer: Self-pay | Admitting: Neurology

## 2019-09-15 MED ORDER — LEVETIRACETAM 500 MG PO TABS: ORAL_TABLET | ORAL | 3 refills | Status: DC

## 2019-09-15 NOTE — Telephone Encounter (Signed)
Patient called to report possible side effects of a new medication, levetiracetam 500 MG. She said she started it last Friday evening. Patient complains of feeling light-headed with no energy at all after taking each dose.  [place]

## 2019-09-15 NOTE — Telephone Encounter (Signed)
Spoke to patient, instructed to take Levetiracetam 500mg  qhs instead of BID. Monitor for seizures on lower dose in addition to Lamictal. All questions answered

## 2019-09-17 ENCOUNTER — Encounter (HOSPITAL_COMMUNITY): Payer: Medicare HMO

## 2019-09-21 ENCOUNTER — Other Ambulatory Visit (HOSPITAL_COMMUNITY)
Admission: RE | Admit: 2019-09-21 | Discharge: 2019-09-21 | Disposition: A | Discharge status: Home / Self Care | Payer: Medicare HMO | Source: Ambulatory Visit | Attending: Cardiology | Admitting: Cardiology

## 2019-09-21 ENCOUNTER — Ambulatory Visit: Payer: Medicare HMO | Admitting: Adult Health

## 2019-09-21 DIAGNOSIS — G4733 Obstructive sleep apnea (adult) (pediatric): Secondary | ICD-10-CM | POA: Diagnosis not present

## 2019-09-21 DIAGNOSIS — Z01812 Encounter for preprocedural laboratory examination: Secondary | ICD-10-CM | POA: Diagnosis not present

## 2019-09-21 DIAGNOSIS — Z20822 Contact with and (suspected) exposure to covid-19: Secondary | ICD-10-CM | POA: Insufficient documentation

## 2019-09-21 LAB — SARS CORONAVIRUS 2 (TAT 6-24 HRS): SARS Coronavirus 2: NEGATIVE

## 2019-09-22 ENCOUNTER — Telehealth: Payer: Self-pay

## 2019-09-24 ENCOUNTER — Inpatient Hospital Stay (HOSPITAL_COMMUNITY): Admission: RE | Admit: 2019-09-24 | Payer: Medicare HMO | Source: Ambulatory Visit

## 2019-09-28 ENCOUNTER — Encounter: Payer: Self-pay | Admitting: Internal Medicine

## 2019-09-28 ENCOUNTER — Other Ambulatory Visit: Payer: Self-pay

## 2019-09-28 ENCOUNTER — Ambulatory Visit (INDEPENDENT_AMBULATORY_CARE_PROVIDER_SITE_OTHER): Payer: Medicare HMO | Admitting: Internal Medicine

## 2019-09-28 VITALS — BP 190/98 | HR 66 | Temp 97.9°F

## 2019-09-28 DIAGNOSIS — I5032 Chronic diastolic (congestive) heart failure: Secondary | ICD-10-CM

## 2019-09-28 DIAGNOSIS — I110 Hypertensive heart disease with heart failure: Secondary | ICD-10-CM

## 2019-09-28 DIAGNOSIS — T43505A Adverse effect of unspecified antipsychotics and neuroleptics, initial encounter: Secondary | ICD-10-CM | POA: Insufficient documentation

## 2019-09-28 DIAGNOSIS — M79672 Pain in left foot: Secondary | ICD-10-CM

## 2019-09-28 DIAGNOSIS — G2111 Neuroleptic induced parkinsonism: Secondary | ICD-10-CM | POA: Diagnosis not present

## 2019-09-28 DIAGNOSIS — M5442 Lumbago with sciatica, left side: Secondary | ICD-10-CM | POA: Diagnosis not present

## 2019-09-28 DIAGNOSIS — F339 Major depressive disorder, recurrent, unspecified: Secondary | ICD-10-CM | POA: Diagnosis not present

## 2019-09-28 DIAGNOSIS — I69354 Hemiplegia and hemiparesis following cerebral infarction affecting left non-dominant side: Secondary | ICD-10-CM

## 2019-09-28 DIAGNOSIS — K861 Other chronic pancreatitis: Secondary | ICD-10-CM

## 2019-09-28 DIAGNOSIS — M79671 Pain in right foot: Secondary | ICD-10-CM | POA: Diagnosis not present

## 2019-09-28 DIAGNOSIS — G8929 Other chronic pain: Secondary | ICD-10-CM

## 2019-09-28 DIAGNOSIS — I25118 Atherosclerotic heart disease of native coronary artery with other forms of angina pectoris: Secondary | ICD-10-CM

## 2019-09-28 MED ORDER — TRIAMCINOLONE ACETONIDE 40 MG/ML IJ SUSP
60.0000 mg | Freq: Once | INTRAMUSCULAR | Status: AC
  Administered 2019-09-28: 60 mg via INTRAMUSCULAR

## 2019-09-28 NOTE — Patient Instructions (Addendum)
Dr. Drue Second [facility]  Magnesium oil   Sciatica  Sciatica is pain, weakness, tingling, or loss of feeling (numbness) along the sciatic nerve. The sciatic nerve starts in the lower back and goes down the back of each leg. Sciatica usually goes away on its own or with treatment. Sometimes, sciatica may come back (recur). What are the causes? This condition happens when the sciatic nerve is pinched or has pressure put on it. This may be the result of:  A disk in between the bones of the spine bulging out too far (herniated disk).  Changes in the spinal disks that occur with aging.  A condition that affects a muscle in the butt.  Extra bone growth near the sciatic nerve.  A break (fracture) of the area between your hip bones (pelvis).  Pregnancy.  Tumor. This is rare. What increases the risk? You are more likely to develop this condition if you:  Play sports that put pressure or stress on the spine.  Have poor strength and ease of movement (flexibility).  Have had a back injury in the past.  Have had back surgery.  Sit for long periods of time.  Do activities that involve bending or lifting over and over again.  Are very overweight (obese). What are the signs or symptoms? Symptoms can vary from mild to very bad. They may include:  Any of these problems in the lower back, leg, hip, or butt: ? Mild tingling, loss of feeling, or dull aches. ? Burning sensations. ? Sharp pains.  Loss of feeling in the back of the calf or the sole of the foot.  Leg weakness.  Very bad back pain that makes it hard to move. These symptoms may get worse when you cough, sneeze, or laugh. They may also get worse when you sit or stand for long periods of time. How is this treated? This condition often gets better without any treatment. However, treatment may include:  Changing or cutting back on physical activity when you have pain.  Doing exercises and  stretching.  Putting ice or heat on the affected area.  Medicines that help: ? To relieve pain and swelling. ? To relax your muscles.  Shots (injections) of medicines that help to relieve pain, irritation, and swelling.  Surgery. Follow these instructions at home: Medicines  Take over-the-counter and prescription medicines only as told by your doctor.  Ask your doctor if the medicine prescribed to you: ? Requires you to avoid driving or using heavy machinery. ? Can cause trouble pooping (constipation). You may need to take these steps to prevent or treat trouble pooping:  Drink enough fluids to keep your pee (urine) pale yellow.  Take over-the-counter or prescription medicines.  Eat foods that are high in fiber. These include beans, whole grains, and fresh fruits and vegetables.  Limit foods that are high in fat and sugar. These include fried or sweet foods. Managing pain      If told, put ice on the affected area. ? Put ice in a plastic bag. ? Place a towel between your skin and the bag. ? Leave the ice on for 20 minutes, 2-3 times a day.  If told, put heat on the affected area. Use the heat source that your doctor tells you to use, such as a moist heat pack or a heating pad. ? Place a towel between your skin and the heat source. ? Leave the heat on for 20-30 minutes. ? Remove the heat if your skin  turns bright red. This is very important if you are unable to feel pain, heat, or cold. You may have a greater risk of getting burned. Activity   Return to your normal activities as told by your doctor. Ask your doctor what activities are safe for you.  Avoid activities that make your symptoms worse.  Take short rests during the day. ? When you rest for a long time, do some physical activity or stretching between periods of rest. ? Avoid sitting for a long time without moving. Get up and move around at least one time each hour.  Exercise and stretch regularly, as told  by your doctor.  Do not lift anything that is heavier than 10 lb (4.5 kg) while you have symptoms of sciatica. ? Avoid lifting heavy things even when you do not have symptoms. ? Avoid lifting heavy things over and over.  When you lift objects, always lift in a way that is safe for your body. To do this, you should: ? Bend your knees. ? Keep the object close to your body. ? Avoid twisting. General instructions  Stay at a healthy weight.  Wear comfortable shoes that support your feet. Avoid wearing high heels.  Avoid sleeping on a mattress that is too soft or too hard. You might have less pain if you sleep on a mattress that is firm enough to support your back.  Keep all follow-up visits as told by your doctor. This is important. Contact a doctor if:  You have pain that: ? Wakes you up when you are sleeping. ? Gets worse when you lie down. ? Is worse than the pain you have had in the past. ? Lasts longer than 4 weeks.  You lose weight without trying. Get help right away if:  You cannot control when you pee (urinate) or poop (have a bowel movement).  You have weakness in any of these areas and it gets worse: ? Lower back. ? The area between your hip bones. ? Butt. ? Legs.  You have redness or swelling of your back.  You have a burning feeling when you pee. Summary  Sciatica is pain, weakness, tingling, or loss of feeling (numbness) along the sciatic nerve.  This condition happens when the sciatic nerve is pinched or has pressure put on it.  Sciatica can cause pain, tingling, or loss of feeling (numbness) in the lower back, legs, hips, and butt.  Treatment often includes rest, exercise, medicines, and putting ice or heat on the affected area. This information is not intended to replace advice given to you by your health care provider. Make sure you discuss any questions you have with your health care provider. Document Revised: 06/30/2018 Document Reviewed:  06/30/2018 Elsevier Patient Education  [place].

## 2019-09-28 NOTE — Progress Notes (Signed)
This visit occurred during the SARS-CoV-2 public health emergency.  Safety protocols were in place, including screening questions prior to the visit, additional usage of staff PPE, and extensive cleaning of exam room while observing appropriate contact time as indicated for disinfecting solutions.  Subjective:     Patient ID: [name] , female    DOB: [birth year] , 74 y.o.   MRN: [id number]   Chief Complaint  Patient presents with  . Back Pain  . Foot Pain    HPI  She is here today, accompanied by her sister, for further evaluation of back and foot pain. She has chronic left-sided sciatica. Has flare that started last week. States she slipped on one of her dog's treats, when she bent down to pick it up, she had a sharp pain in lower back when she started to rise into standing position. Denies bladder/fecal incontinence. She has had severe pain since last Thursday. No relief with OTC meds.   Also, she would like referral to foot specialist. Needs orthotics. She reports her feet roll over and it hurts her ankles. She has been seen by Ortho who suggested surgery, she does not wish to proceed with this.   Back Pain This is a chronic problem. The current episode started in the past 7 days. The problem occurs constantly. The problem has been gradually worsening since onset. The pain is present in the lumbar spine and gluteal. The quality of the pain is described as aching. The pain radiates to the left thigh. The pain is at a severity of 8/10. The pain is moderate. The pain is the same all the time. The symptoms are aggravated by bending, lying down and sitting. Pertinent negatives include no abdominal pain, bladder incontinence, bowel incontinence or perianal numbness. The treatment provided moderate relief.  Foot Pain Associated symptoms include arthralgias. Pertinent negatives include no abdominal pain.     Past Medical History:  Diagnosis Date  . Abnormal liver function     in the  past.  . Anemia   . Arthritis    all over  . Bruises easily   . Cataract    right eye;immature  . Chronic back pain    stenosis  . Chronic cough   . Chronic kidney disease   . COPD (chronic obstructive pulmonary disease) ([place])   . Demand myocardial infarction [facility]) 2012   Demand Infarction in setting of Pancreatitis --> mild Troponin elevation, NON-OBSTRUCTIVE CAD  . Depression    takes Abilify daily as well as Zoloft  . Diastolic heart failure 9702   Grade 1 diastolic Dysfunction by Echo   . Diverticulosis   . DM (diabetes mellitus) ([place])    takes Victoza daily as well as Lantus and Humalog  . Empty sella ([place])    on MRI in 2009.  . Glaucoma   . Headache(784.0)    last migraine-4-22yr ago  . History of blood transfusion    no abnormal reaction noted  . History of colon polyps    benign  . HTN (hypertension)    takes BK[employer] Morgan Energydaily  . Hyperlipidemia    takes Lipitor daily  . Joint swelling   . Nocturia   . Obesity hypoventilation syndrome ([place]   . Obstructive sleep apnea 02/2018   Notably improved split-night study with weight loss from 270 (2017) down to 250 pounds (2019).  . Pancreatitis    takes Pancrelipase daily  . Parkinson's disease ([place]    takes Sinemet daily  . Peripheral  neuropathy   . Pneumonia 2012  . Seizures ([place])    takes Lamictal daily and Primidone nightly;last seizure 2wks ago  . Urinary urgency    With increased frequency  . Varicose veins of both lower extremities with pain    With edema.  Takes daily Lasix     Family History  Problem Relation Age of Onset  . Allergies Father   . Heart disease Father        before 98  . Heart failure Father   . Hypertension Father   . Hyperlipidemia Father   . Heart disease Mother   . Diabetes Mother   . Hypertension Mother   . Hyperlipidemia Mother   . Hypertension Sister   . Heart disease Sister        before 52  . Hyperlipidemia Sister   . Diabetes Brother   .  Hypertension Brother   . Diabetes Sister   . Hypertension Sister   . Diabetes Son   . Hypertension Son   . Cancer Other      Current Outpatient Medications:  .  Accu-Chek Softclix Lancets lancets, Use as instructed to check blood sugars 2 times per day dx: e11.22, Disp: 300 each, Rfl: 3 .  acetaminophen (TYLENOL) 500 MG tablet, Take 500-1,000 mg by mouth every 4 (four) hours as needed for moderate pain. , Disp: , Rfl:  .  albuterol (VENTOLIN HFA) 108 (90 Base) MCG/ACT inhaler, Inhale 1-2 puffs into the lungs every 6 (six) hours as needed for wheezing or shortness of breath., Disp: , Rfl:  .  atorvastatin (LIPITOR) 10 MG tablet, Take 10 mg by mouth every evening., Disp: , Rfl:  .  B Complex-C (B-COMPLEX WITH VITAMIN C) tablet, Take 1 tablet by mouth daily., Disp: , Rfl:  .  Blood Glucose Calibration (ACCU-CHEK AVIVA) SOLN, Use as directed dx: e11.22, Disp: 1 each, Rfl: 3 .  Blood Glucose Monitoring Suppl (ACCU-CHEK AVIVA PLUS) w/Device KIT, Use as directed to check blood sugars 2 times per day dx: e11.22, Disp: 1 kit, Rfl: 3 .  carvedilol (COREG) 25 MG tablet, Take 1 tablet (25 mg total) by mouth 2 (two) times daily with a meal., Disp: 180 tablet, Rfl: 3 .  Cholecalciferol (VITAMIN D-3) 1000 units CAPS, Take 1,000 Units by mouth daily., Disp: , Rfl:  .  diclofenac sodium (VOLTAREN) 1 % GEL, Apply 2 g topically 4 (four) times daily as needed (pain)., Disp: , Rfl:  .  DROPLET PEN NEEDLES 31G X 8 MM MISC, USE DAILY WITH VICTOZA AND/OR NOVOLOG. , Disp: 400 each, Rfl: 1 .  furosemide (LASIX) 40 MG tablet, TAKE ONE TABLET BY MOUTH SATURDAY AND SUNDAY, Disp: 24 tablet, Rfl: 0 .  furosemide (LASIX) 80 MG tablet, TAKE ONE TABLET BY MOUTH DAILY  MONDAY THRU FRIDAY, Disp: 60 tablet, Rfl: 11 .  glucose blood (ACCU-CHEK AVIVA PLUS) test strip, Use as instructed to check blood sugars 2 times per day dx: e11.22, Disp: 300 each, Rfl: 3 .  hydrALAZINE (APRESOLINE) 100 MG tablet, Take 1 tablet (100 mg total) by  mouth 2 (two) times daily., Disp: 180 tablet, Rfl: 2 .  hydrocortisone cream 1 %, Apply 1 application topically daily as needed for itching., Disp: , Rfl:  .  insulin glargine, 1 Unit Dial, (TOUJEO) 300 UNIT/ML Solostar Pen, Inject 34 Units into the skin at bedtime., Disp: , Rfl:  .  insulin lispro (HUMALOG KWIKPEN) 100 UNIT/ML KwikPen, Inject 0.02-0.04 mLs (2-4 Units total) into the skin 3 (three)  times daily as needed (high blood sugar over 150)., Disp: 15 mL, Rfl: 2 .  Isopropyl Alcohol (ALCOHOL WIPES) 70 % MISC, Apply 1 each topically 2 (two) times daily., Disp: 300 each, Rfl: 3 .  isosorbide mononitrate (IMDUR) 60 MG 24 hr tablet, Take 1 tablet (60 mg total) by mouth daily., Disp: 90 tablet, Rfl: 0 .  lamoTRIgine (LAMICTAL) 150 MG tablet, Take 1 tablet (150 mg total) by mouth 2 (two) times daily., Disp: 180 tablet, Rfl: 3 .  levETIRAcetam (KEPPRA) 500 MG tablet, Take 1 tablet every night, Disp: 90 tablet, Rfl: 3 .  Menthol, Topical Analgesic, (STOPAIN ROLL-ON EX), Apply 1 application topically daily as needed (pain)., Disp: , Rfl:  .  metolazone (ZAROXOLYN) 2.5 MG tablet, Take 2.5 mg by mouth See admin instructions. Take 2.5 mg by mouth Monday through Friday as needed if weight is greater or equal to 269 lbs, Disp: , Rfl:  .  Multiple Vitamins-Minerals (MULTIVITAMIN WITH MINERALS) tablet, Take 1 tablet by mouth daily., Disp: , Rfl:  .  neomycin-bacitracin-polymyxin (NEOSPORIN) ointment, Apply 1 application topically as needed for wound care., Disp: , Rfl:  .  OZEMPIC, 1 MG/DOSE, 2 MG/1.5ML SOPN, INJECT 1MG INTO THE SKIN ONCE A WEEK, Disp: 12 mL, Rfl: 0 .  Pancrelipase, Lip-Prot-Amyl, (ZENPEP) 25000-79000 units CPEP, Take 1-2 capsules by mouth See admin instructions. Take 2 capsules in the morning, 1 capsule with lunch, and 2 capsules in the evening., Disp: , Rfl:  .  potassium chloride SA (KLOR-CON) 20 MEQ tablet, Take 40 meq daily, when taking metolazone take a total of 60 meq daily, Disp: , Rfl:   .  sertraline (ZOLOFT) 25 MG tablet, Take 25 mg by mouth See admin instructions. Take with 50 mg to equal 75 mg at bedtime, Disp: , Rfl:  .  sertraline (ZOLOFT) 50 MG tablet, TAKE 1 TABLET EVERY DAY, Disp: 90 tablet, Rfl: 2 .  Tolnaftate (TINACTIN EX), Apply 1 application topically daily as needed (foot fungus)., Disp: , Rfl:   Current Facility-Administered Medications:  .  triamcinolone acetonide (KENALOG-40) injection 60 mg, 60 mg, Intramuscular, Once, [name], MD   Allergies  Allergen Reactions  . Other Anaphylaxis and Rash    Bleach  . Penicillins Hives    Did it involve swelling of the face/tongue/throat, SOB, or low BP? No Did it involve sudden or severe rash/hives, skin peeling, or any reaction on the inside of your mouth or nose? Yes Did you need to seek medical attention at a hospital or doctor's office? Yes When did it last happen?Childhood allergy  If all above answers are "NO", may proceed with cephalosporin use.    . Sulfa Antibiotics Hives  . Aspirin Other (See Comments)     On aspirin 81 mg - Rectal bleeding in dec 2018  . Codeine     Headache and makes the patient feel "off"     Review of Systems  Constitutional: Negative.   Respiratory: Negative.   Cardiovascular: Negative.   Gastrointestinal: Negative.  Negative for abdominal pain and bowel incontinence.  Genitourinary: Negative for bladder incontinence.  Musculoskeletal: Positive for arthralgias and back pain.  Neurological: Negative.   Psychiatric/Behavioral: Negative.      Today's Vitals   09/28/19 0916 09/28/19 1004  BP: (!) 200/128 (!) 190/98  Pulse: 66 66  Temp: 97.9 F (36.6 C)    There is no height or weight on file to calculate BMI.   Objective:  Physical Exam Vitals and nursing note reviewed.  Constitutional:  Appearance: Normal appearance. She is obese.  HENT:     Head: Normocephalic and atraumatic.  Cardiovascular:     Rate and Rhythm: Normal rate and regular  rhythm.     Heart sounds: Normal heart sounds.  Pulmonary:     Effort: Pulmonary effort is normal.     Breath sounds: Normal breath sounds.  Musculoskeletal:     Comments: No spinal tenderness. There is paraspinal tenderness and glute tenderness.   Skin:    General: Skin is warm.  Neurological:     General: No focal deficit present.     Mental Status: She is alert.  Psychiatric:        Mood and Affect: Mood normal.        Behavior: Behavior normal.         Assessment And Plan:     1. Chronic left-sided low back pain with left-sided sciatica  Acute flare of chronic illness.  She was given Kenalog, 41m IM x1. Advised to get magnesium oil to rub onto lower back. If persistent, will consider Neurosurg eval. She has had back surgery in the past.   - triamcinolone acetonide (KENALOG-40) injection 60 mg  2. Foot pain, bilateral  She would definitely benefit from Orthotics. This would likely also decrease her back pain. Referral placed as requested.   - Ambulatory referral to Podiatry  3. Hypertensive heart disease with chronic diastolic congestive heart failure (HCC)  Chronic, terribly uncontrolled.  Repeat BP is still markedly elevated, but improved. She admits she has yet to take fluid pill today. Encouraged to take all meds as prescribed. Encouraged to avoid adding salt to her foods.   5. Neuroleptic-induced Parkinsonism (HCC)  Chronic, yet stable.   6. 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 Importance of improved BP/BS control to decrease risk of repeat event was discussed with the patient.   7. Recurrent depression (HCC)  Chronic, yet stable. She will continue with current meds.   8. Other chronic pancreatitis ([place]  Chronic, she reports having intermittent flares.   9. Atherosclerotic heart disease of native coronary artery with other forms of angina pectoris (HCC)  Chronic, yet stable. Encouraged to  comply with dietary and medication recommendations. She is encouraged to incorporate more exercise into her daily routine.    RMaximino Greenland MD    THE PATIENT IS ENCOURAGED TO PRACTICE SOCIAL DISTANCING DUE TO THE COVID-19 PANDEMIC.

## 2019-09-30 ENCOUNTER — Telehealth: Payer: Self-pay

## 2019-09-30 ENCOUNTER — Ambulatory Visit: Payer: Self-pay

## 2019-09-30 DIAGNOSIS — G8929 Other chronic pain: Secondary | ICD-10-CM

## 2019-09-30 DIAGNOSIS — M5442 Lumbago with sciatica, left side: Secondary | ICD-10-CM

## 2019-09-30 NOTE — Chronic Care Management (AMB) (Signed)
  Chronic Care Management   Outreach Note  09/30/2019 Name: [name] MRN: [id number] DOB: Aug 25, 1945  Referred by: [name], MD Reason for referral : Care Coordination   SW placed an unsuccessful outbound call to the patient to assist with care coordination needs. SW left a HIPAA compliant voice message requesting a return call.  Follow Up Plan: The care management team will reach out to the patient again over the next 14 days.   Daneen Schick, BSW, CDP Social Worker, Certified Dementia Practitioner [place] [facility] [place] [facility] [phone]

## 2019-10-01 ENCOUNTER — Ambulatory Visit: Payer: Medicare HMO | Admitting: Neurology

## 2019-10-01 ENCOUNTER — Encounter: Payer: Self-pay | Admitting: Internal Medicine

## 2019-10-05 ENCOUNTER — Ambulatory Visit: Payer: Medicare HMO | Admitting: Adult Health

## 2019-10-07 ENCOUNTER — Telehealth: Payer: Self-pay | Admitting: Neurology

## 2019-10-07 NOTE — Telephone Encounter (Signed)
Spoke with the pharmacy medication dose fixed at the pharmacy

## 2019-10-07 NOTE — Telephone Encounter (Signed)
[facility] called for clarification on patient's prescription for Keppra 500 MG. They've received two prescriptions for the same medication but with different directions.

## 2019-10-08 ENCOUNTER — Telehealth (HOSPITAL_COMMUNITY): Payer: Self-pay | Admitting: *Deleted

## 2019-10-08 NOTE — Telephone Encounter (Signed)
Pt left VM stating she was ready to have her pfts. PFTs were ordered at her last office visit but patient said she would call the office back to schedule. Pt asks that we contact her sister [name] at [phone] to schedule.  Routed to [place] to schedule

## 2019-10-12 ENCOUNTER — Telehealth: Payer: Self-pay

## 2019-10-12 ENCOUNTER — Ambulatory Visit: Payer: Self-pay

## 2019-10-12 ENCOUNTER — Ambulatory Visit: Payer: Medicare HMO | Admitting: Neurology

## 2019-10-12 DIAGNOSIS — I5032 Chronic diastolic (congestive) heart failure: Secondary | ICD-10-CM

## 2019-10-12 DIAGNOSIS — N182 Chronic kidney disease, stage 2 (mild): Secondary | ICD-10-CM

## 2019-10-12 DIAGNOSIS — E1122 Type 2 diabetes mellitus with diabetic chronic kidney disease: Secondary | ICD-10-CM

## 2019-10-12 NOTE — Chronic Care Management (AMB) (Signed)
  Chronic Care Management   Outreach Note  10/12/2019 Name: SUELLA WILMORE MRN: [id number] DOB: 1946-03-28  Referred by: [name], MD Reason for referral : Care Coordination   SW placed a second unsuccessful outbound call to the patient to to assist with care coordination needs. SW left a HIPAA compliant voice message requesting a return call.  Follow Up Plan: The care management team will reach out to the patient again over the next 14 days.   Daneen Schick, BSW, CDP Social Worker, Certified Dementia Practitioner [place] [facility] [place] [facility] [phone]

## 2019-10-13 ENCOUNTER — Telehealth (HOSPITAL_COMMUNITY): Payer: Self-pay | Admitting: Vascular Surgery

## 2019-10-17 ENCOUNTER — Other Ambulatory Visit: Payer: Self-pay | Admitting: Cardiology

## 2019-10-20 ENCOUNTER — Other Ambulatory Visit: Payer: Self-pay | Admitting: Cardiology

## 2019-10-20 MED ORDER — POTASSIUM CHLORIDE CRYS ER 20 MEQ PO TBCR: EXTENDED_RELEASE_TABLET | ORAL | 1 refills | Status: DC

## 2019-10-20 NOTE — Telephone Encounter (Signed)
Rx(s) sent to pharmacy electronically.  

## 2019-10-20 NOTE — Telephone Encounter (Signed)
New Message   *STAT* If patient is at the pharmacy, call can be transferred to refill team.   1. Which medications need to be refilled? (please list name of each medication and dose if known)    potassium chloride SA (KLOR-CON) 20 MEQ tablet   2. Which pharmacy/location (including street and city if local pharmacy) is medication to be sent to? [place], [place]  3. Do they need a 30 day or 90 day supply? 90 day

## 2019-10-22 ENCOUNTER — Ambulatory Visit: Payer: Self-pay

## 2019-10-22 ENCOUNTER — Telehealth: Payer: Self-pay | Admitting: Internal Medicine

## 2019-10-22 DIAGNOSIS — I5032 Chronic diastolic (congestive) heart failure: Secondary | ICD-10-CM

## 2019-10-22 DIAGNOSIS — I110 Hypertensive heart disease with heart failure: Secondary | ICD-10-CM

## 2019-10-22 NOTE — Chronic Care Management (AMB) (Signed)
  Chronic Care Management   Note  10/22/2019 Name: KIALEY PRETTYMAN MRN: [id number] DOB: Mar 06, 1946  MABELINE HINGSON is a 74 y.o. year old female who is a primary care patient of [name], MD and is actively engaged with the care management team. I reached out to [name] by phone today to assist with scheduling an initial visit with the Pharmacist.  Follow up plan: Telephone appointment with care management team member scheduled for: 11/02/2019.  [place], [place] [address] Direct Dial: [phone] [email or web address]

## 2019-10-23 ENCOUNTER — Other Ambulatory Visit (HOSPITAL_COMMUNITY)
Admission: RE | Admit: 2019-10-23 | Discharge: 2019-10-23 | Disposition: A | Discharge status: Home / Self Care | Payer: Medicare HMO | Source: Ambulatory Visit | Attending: Cardiology | Admitting: Cardiology

## 2019-10-23 DIAGNOSIS — Z20822 Contact with and (suspected) exposure to covid-19: Secondary | ICD-10-CM | POA: Insufficient documentation

## 2019-10-23 DIAGNOSIS — Z01812 Encounter for preprocedural laboratory examination: Secondary | ICD-10-CM | POA: Insufficient documentation

## 2019-10-23 LAB — SARS CORONAVIRUS 2 (TAT 6-24 HRS): SARS Coronavirus 2: NEGATIVE

## 2019-10-23 NOTE — Chronic Care Management (AMB) (Signed)
Chronic Care Management    Social Work Follow Up Note  10/22/2019 Name: [name] MRN: [id number] DOB: [birth year]  [name] is a 74 y.o. year old female who is a primary care patient of [name] Chard, MD. The CCM team was consulted for assistance with care coordination.   Review of patient status, including review of consultants reports, other relevant assessments, and collaboration with appropriate care team members and the patient's provider was performed as part of comprehensive patient evaluation and provision of chronic care management services.    SW placed a successful outbound call to the patient to assist with care coordination needs. The patient reports her BP continues to fluctuate. Reviewed chart to note upcomming appointment with embedded PharmD. Encouraged the patient to speak with PharmD regarding ongoing high BP readings in the morning.    Outpatient Encounter Medications as of 10/22/2019  Medication Sig  . Accu-Chek Softclix Lancets lancets Use as instructed to check blood sugars 2 times per day dx: e11.22  . acetaminophen (TYLENOL) 500 MG tablet Take 500-1,000 mg by mouth every 4 (four) hours as needed for moderate pain.   Marland Kitchen albuterol (VENTOLIN HFA) 108 (90 Base) MCG/ACT inhaler Inhale 1-2 puffs into the lungs every 6 (six) hours as needed for wheezing or shortness of breath.  Marland Kitchen atorvastatin (LIPITOR) 10 MG tablet Take 10 mg by mouth every evening.  . B Complex-C (B-COMPLEX WITH VITAMIN C) tablet Take 1 tablet by mouth daily.  . Blood Glucose Calibration (ACCU-CHEK AVIVA) SOLN Use as directed dx: e11.22  . Blood Glucose Monitoring Suppl (ACCU-CHEK AVIVA PLUS) w/Device KIT Use as directed to check blood sugars 2 times per day dx: e11.22  . carvedilol (COREG) 25 MG tablet Take 1 tablet (25 mg total) by mouth 2 (two) times daily with a meal.  . Cholecalciferol (VITAMIN D-3) 1000 units CAPS Take 1,000 Units by mouth daily.  . diclofenac sodium (VOLTAREN) 1 % GEL  Apply 2 g topically 4 (four) times daily as needed (pain).  . DROPLET PEN NEEDLES 31G X 8 MM MISC USE DAILY WITH VICTOZA AND/OR NOVOLOG.   . furosemide (LASIX) 40 MG tablet TAKE ONE TABLET BY MOUTH SATURDAY AND SUNDAY  . furosemide (LASIX) 80 MG tablet TAKE ONE TABLET BY MOUTH DAILY  MONDAY THRU FRIDAY  . glucose blood (ACCU-CHEK AVIVA PLUS) test strip Use as instructed to check blood sugars 2 times per day dx: e11.22  . hydrALAZINE (APRESOLINE) 100 MG tablet Take 1 tablet (100 mg total) by mouth 2 (two) times daily.  . hydrocortisone cream 1 % Apply 1 application topically daily as needed for itching.  . insulin glargine, 1 Unit Dial, (TOUJEO) 300 UNIT/ML Solostar Pen Inject 34 Units into the skin at bedtime.  . insulin lispro (HUMALOG KWIKPEN) 100 UNIT/ML KwikPen Inject 0.02-0.04 mLs (2-4 Units total) into the skin 3 (three) times daily as needed (high blood sugar over 150).  . Isopropyl Alcohol (ALCOHOL WIPES) 70 % MISC Apply 1 each topically 2 (two) times daily.  . isosorbide mononitrate (IMDUR) 60 MG 24 hr tablet Take 1 tablet (60 mg total) by mouth daily.  Marland Kitchen lamoTRIgine (LAMICTAL) 150 MG tablet Take 1 tablet (150 mg total) by mouth 2 (two) times daily.  Marland Kitchen levETIRAcetam (KEPPRA) 500 MG tablet Take 1 tablet every night  . Menthol, Topical Analgesic, (STOPAIN ROLL-ON EX) Apply 1 application topically daily as needed (pain).  Marland Kitchen metolazone (ZAROXOLYN) 2.5 MG tablet Take 2.5 mg by mouth See admin instructions. Take 2.5 mg  by mouth Monday through Friday as needed if weight is greater or equal to 269 lbs  . Multiple Vitamins-Minerals (MULTIVITAMIN WITH MINERALS) tablet Take 1 tablet by mouth daily.  Marland Kitchen neomycin-bacitracin-polymyxin (NEOSPORIN) ointment Apply 1 application topically as needed for wound care.  Marland Kitchen OZEMPIC, 1 MG/DOSE, 2 MG/1.5ML SOPN INJECT 1MG INTO THE SKIN ONCE A WEEK  . Pancrelipase, Lip-Prot-Amyl, (ZENPEP) 25000-79000 units CPEP Take 1-2 capsules by mouth See admin instructions. Take 2  capsules in the morning, 1 capsule with lunch, and 2 capsules in the evening.  . potassium chloride SA (KLOR-CON) 20 MEQ tablet Take 40 meq daily, when taking metolazone take a total of 60 meq daily  . sertraline (ZOLOFT) 25 MG tablet Take 25 mg by mouth See admin instructions. Take with 50 mg to equal 75 mg at bedtime  . sertraline (ZOLOFT) 50 MG tablet TAKE 1 TABLET EVERY DAY  . Tolnaftate (TINACTIN EX) Apply 1 application topically daily as needed (foot fungus).   No facility-administered encounter medications on file as of 10/22/2019.      Follow Up Plan: SW will follow up with patient by phone over the next 60 days.   Daneen Schick, BSW, CDP Social Worker, Certified Dementia Practitioner [place] [facility] [place] [facility] [phone]

## 2019-10-26 ENCOUNTER — Ambulatory Visit (HOSPITAL_COMMUNITY)
Admission: RE | Admit: 2019-10-26 | Discharge: 2019-10-26 | Disposition: A | Discharge status: Home / Self Care | Payer: Medicare HMO | Source: Ambulatory Visit | Attending: Cardiology | Admitting: Cardiology

## 2019-10-26 ENCOUNTER — Other Ambulatory Visit: Payer: Self-pay

## 2019-10-26 ENCOUNTER — Telehealth (HOSPITAL_COMMUNITY): Payer: Self-pay | Admitting: Cardiology

## 2019-10-26 DIAGNOSIS — I5032 Chronic diastolic (congestive) heart failure: Secondary | ICD-10-CM | POA: Diagnosis not present

## 2019-10-26 LAB — PULMONARY FUNCTION TEST
DL/VA % pred: 122 %
DL/VA: 5.18 ml/min/mmHg/L
DLCO unc % pred: 94 %
DLCO unc: 16.54 ml/min/mmHg
FEF 25-75 Post: 0.92 L/sec
FEF 25-75 Pre: 1.9 L/sec
FEF2575-%Change-Post: -51 %
FEF2575-%Pred-Post: 63 %
FEF2575-%Pred-Pre: 130 %
FEV1-%Change-Post: -17 %
FEV1-%Pred-Post: 83 %
FEV1-%Pred-Pre: 100 %
FEV1-Post: 1.29 L
FEV1-Pre: 1.56 L
FEV1FVC-%Change-Post: -3 %
FEV1FVC-%Pred-Pre: 113 %
FEV6-%Change-Post: -14 %
FEV6-%Pred-Post: 79 %
FEV6-%Pred-Pre: 92 %
FEV6-Post: 1.52 L
FEV6-Pre: 1.77 L
FEV6FVC-%Pred-Post: 104 %
FEV6FVC-%Pred-Pre: 104 %
FVC-%Change-Post: -14 %
FVC-%Pred-Post: 75 %
FVC-%Pred-Pre: 88 %
FVC-Post: 1.52 L
FVC-Pre: 1.77 L
Post FEV1/FVC ratio: 85 %
Post FEV6/FVC ratio: 100 %
Pre FEV1/FVC ratio: 88 %
Pre FEV6/FVC Ratio: 100 %
RV % pred: 89 %
RV: 1.84 L
TLC % pred: 81 %
TLC: 3.77 L

## 2019-10-26 MED ORDER — ALBUTEROL SULFATE (2.5 MG/3ML) 0.083% IN NEBU
2.5000 mg | INHALATION_SOLUTION | Freq: Once | RESPIRATORY_TRACT | Status: AC
  Administered 2019-10-26: 2.5 mg via RESPIRATORY_TRACT

## 2019-10-26 NOTE — Telephone Encounter (Signed)
    [facility] [place] [id number] [place] [address] Dept: [phone] Loc: [phone]  LIBBY CANDANOZA  10/26/2019  You are scheduled for a Cardiac Catheterization on Monday, May 17 with Dr. Loralie Champagne.  1. Please arrive at the [facility] (Main Entrance A) at [facility]: [address] [place], [place] [address] at 7:00 AM (This time is two hours before your procedure to ensure your preparation). Free valet parking service is available.   Special note: Every effort is made to have your procedure done on time. Please understand that emergencies sometimes delay scheduled procedures.  2. Diet: Do not eat solid foods after midnight.  The patient may have clear liquids until 5am upon the day of the procedure.  3. Labs: You will need to have blood drawn on Friday, May 14 at 11am in  the [facility]. Parking code 5008 You do not need to be fasting.   You will need a pre procedure COVID test    WHEN: 11/06/2019 anytime between 9am-3pm WHERE: [facility]  [place] [address]  This is a drive thru testing site, you will remain in your car. Be sure to get in the line FOR PROCEDURES Once you have been swabbed you will need to remain home in quarantine until you return for your procedure.  4. Medication instructions in preparation for your procedure:   Contrast Allergy: No    Stop taking, Lasix (Furosemide)  Monday, May 17,   Take only 16 units of Toujeo and 1/2 dose of Humalog the night before your procedure. Do not take any insulin the day of your procedure.    On the morning of your procedure, take your Aspirin and any morning medicines NOT listed above.  You may use sips of water.  5. Plan for one night stay--bring personal belongings. 6. Bring a current list of your medications and current insurance cards. 7. You MUST have a  responsible person to drive you home. 8. Someone MUST be with you the first 24 hours after you arrive home or your discharge will be delayed. 9. Please wear clothes that are easy to get on and off and wear slip-on shoes.  Thank you for allowing [place] to care for you!   -- [facility]

## 2019-10-27 ENCOUNTER — Ambulatory Visit: Payer: Medicare HMO | Admitting: Sports Medicine

## 2019-10-29 ENCOUNTER — Encounter: Payer: Medicare HMO | Admitting: Internal Medicine

## 2019-10-29 ENCOUNTER — Ambulatory Visit: Payer: Medicare HMO

## 2019-10-29 ENCOUNTER — Telehealth (HOSPITAL_COMMUNITY): Payer: Self-pay | Admitting: *Deleted

## 2019-10-29 NOTE — Telephone Encounter (Signed)
Pt called to go over instructions for cath. Cath instructions reviewed from letter generated by Garlan Fair, [place] on 5/3. Pt verbalized understanding and thanked me for the call.

## 2019-11-01 ENCOUNTER — Other Ambulatory Visit: Payer: Self-pay | Admitting: Internal Medicine

## 2019-11-01 ENCOUNTER — Other Ambulatory Visit: Payer: Self-pay | Admitting: Cardiology

## 2019-11-01 DIAGNOSIS — I2510 Atherosclerotic heart disease of native coronary artery without angina pectoris: Secondary | ICD-10-CM

## 2019-11-02 ENCOUNTER — Telehealth: Payer: Medicare HMO

## 2019-11-02 ENCOUNTER — Telehealth: Payer: Self-pay | Admitting: Internal Medicine

## 2019-11-02 NOTE — Chronic Care Management (AMB) (Signed)
  Care Management   Note  11/02/2019 Name: [name] MRN: [id number] DOB: 15-Sep-1945  KMYA BURGHARDT is a 74 y.o. year old female who is a primary care patient of [name], MD and is actively engaged with the care management team. I reached out to [name] by phone today to assist with re-scheduling an initial visit with the Pharmacist.  Follow up plan: Unsuccessful telephone outreach attempt made. A HIPPA compliant phone message was left for the patient providing contact information and requesting a return call. The care management team will reach out to the patient again over the next 7 days.  If patient returns call to provider office, please advise to call [place] at [phone].  [place], [place] [address] Direct Dial: [phone] [email or web address] [email or web address]: [email or web address]

## 2019-11-02 NOTE — Chronic Care Management (AMB) (Deleted)
Chronic Care Management Pharmacy  Name: [name]  MRN: [id number] DOB: 25-Jan-1946  Chief Complaint/ HPI  [name],  74 y.o. , female presents for their Initial CCM visit with the clinical pharmacist via telephone due to COVID-19 Pandemic.  PCP : [name], MD  Their chronic conditions include: Hypertensive heart disease, Chronic diastolic congestive heart failure, Diabetes, Dyslipidemia, Rheumatoid arthritis  Office Visits: 09/28/19 OV: Presented for evaluation of back and foot pain. Chronic left-sided sciatica noted. Back pain is chronic, but current episode began in the past 7 days. Pain is aching, 8/10 with no relief from OTC meds. Kenalog 36m injection administered in office.  Topical magnesium oil recommended for lower back. If persistent, consider Neurosurgery evaluation. Aggravated by bending, lying down and sitting. Referred to Podiatry per pt request for orthotics (PCP agreed orthotics would be beneficial).   08/24/19 OV: Diabetes check. Pt reported 3-4 "episodes" since Ed visit on 2/6. Last Friday she has an episode and came to on the floor. Does not think she hit her head, but hurt her right knee. Using diclofenac on occasion for pain. Pt has not started new seizure medication prescribed by ER physician (Keppra). Exercise encouraged. Labs ordered (BMP8+EGFR, HgbA1c, potassium, lamotrigine).  Consult Visit: 11/09/19: Cardiac catheterization scheduled  09/15/19 Neurology Telephone call: Pt reported starting Keppra 5041mon 3/19 and felt light-headed/ had no energy after taking each dose. Pt instructed to decrease dose from BID to QHS. Monitor for seizures.  09/11/19 Neurology OV w/ Dr. AqDelice LeschFollow up for co-existing epileptic seizures and psychogenic non-epileptic events (PNES). Pt's sister reported back to back seizures in January and lamotrigine was increased to 15081mID. Keppra 500m33mD prescribed after ED visit for seizures, but patient had not been taking.  Started Keppra 500mg47m and continue lamotrigine 150mg 10m Follow up in 6 months.  09/08/19 Cardiology OV w/ Dr. McLeanAundra Dubinow up for CHF. Not volume overloaded despite NYHA class III symptoms. Continue Lasix 80mg d33m. Dr does not think metolazone is necessary.  Consider Cardiac MRI to assess for ARVC. Will arrange for PFTs. Will place CArdiomems pressure sensor. Follow up in 3 months.   08/01/19 ED visit: Presented for history of seizures and chest pain. Seizures have been more frequent over the last moth according to patient's sister. Reported a possible seizure last night and a seizure this morning lasting 7 minutes (toni clonic mostly upper body). Potassium low at 3.4. Kidney function normal. Troponin of 7 (Dr. Butler Melina Copad ischemia). Neurologist in hospital (Dr. LindzenCheral Markermended Keppra 500mg tw22mdaily and follow up with primary neurologist.   07/23/19 Cardiology OV w/ Dr. McLean: Aundra Dubined by Dr. Harding Ellyn Hacklogist)   CCM Encounters: 10/22/19 SW: Care coordination. Reported fluctuating and elevated BP readings in the mornings.   09/07/19 RN: Discussed diabetes treatment plan and goal to decreased A1c <7%. Diet education. Coordinated insulin refills. Discussed seizure treatment plan and patient's follow up with neurologist. Discussed CHF treatment plan.   09/02/19 SW/RN: Care coordination. Elevated BPs reported (128/62-176/77) and patient reported feeling faint yesterday.   08/07/19 SW: Care coordination following ED visit. Patient encouraged to follow up with PCP and neurologist as instructed.     Medications: Outpatient Encounter Medications as of 11/02/2019  Medication Sig  . Accu-Chek Softclix Lancets lancets Use as instructed to check blood sugars 2 times per day dx: e11.22  . acetaminophen (TYLENOL) 500 MG tablet Take 500-1,000 mg by mouth every 4 (four) hours as needed for moderate pain.   .Marland Kitchen  albuterol (VENTOLIN HFA) 108 (90 Base) MCG/ACT inhaler Inhale 1-2 puffs into the  lungs every 6 (six) hours as needed for wheezing or shortness of breath.  Marland Kitchen atorvastatin (LIPITOR) 10 MG tablet Take 10 mg by mouth every evening.  . B Complex-C (B-COMPLEX WITH VITAMIN C) tablet Take 1 tablet by mouth daily.  . Blood Glucose Calibration (ACCU-CHEK AVIVA) SOLN Use as directed dx: e11.22  . Blood Glucose Monitoring Suppl (ACCU-CHEK AVIVA PLUS) w/Device KIT Use as directed to check blood sugars 2 times per day dx: e11.22  . carvedilol (COREG) 25 MG tablet Take 1 tablet (25 mg total) by mouth 2 (two) times daily with a meal.  . Cholecalciferol (VITAMIN D-3) 1000 units CAPS Take 1,000 Units by mouth daily.  . diclofenac sodium (VOLTAREN) 1 % GEL Apply 2 g topically 4 (four) times daily as needed (pain).  . DROPLET PEN NEEDLES 31G X 8 MM MISC USE DAILY WITH VICTOZA AND/OR NOVOLOG.   . furosemide (LASIX) 40 MG tablet TAKE ONE TABLET BY MOUTH SATURDAY AND SUNDAY (Patient taking differently: Take 40 mg by mouth See admin instructions. Sat and Sun only)  . furosemide (LASIX) 80 MG tablet TAKE ONE TABLET BY MOUTH DAILY  MONDAY THRU FRIDAY (Patient taking differently: Take 80 mg by mouth See admin instructions. TAKE ONE TABLET BY MOUTH DAILY  MONDAY THRU [place])  . glucose blood (ACCU-CHEK AVIVA PLUS) test strip Use as instructed to check blood sugars 2 times per day dx: e11.22  . hydrALAZINE (APRESOLINE) 100 MG tablet Take 1 tablet (100 mg total) by mouth 2 (two) times daily.  . hydrocortisone cream 1 % Apply 1 application topically daily as needed for itching.  . insulin glargine, 1 Unit Dial, (TOUJEO) 300 UNIT/ML Solostar Pen Inject 34 Units into the skin at bedtime.  . insulin lispro (HUMALOG KWIKPEN) 100 UNIT/ML KwikPen Inject 0.02-0.04 mLs (2-4 Units total) into the skin 3 (three) times daily as needed (high blood sugar over 150).  . Isopropyl Alcohol (ALCOHOL WIPES) 70 % MISC Apply 1 each topically 2 (two) times daily.  . isosorbide mononitrate (IMDUR) 60 MG 24 hr tablet Take 1 tablet  (60 mg total) by mouth daily.  Marland Kitchen lamoTRIgine (LAMICTAL) 150 MG tablet Take 1 tablet (150 mg total) by mouth 2 (two) times daily.  Marland Kitchen levETIRAcetam (KEPPRA) 500 MG tablet Take 1 tablet every night (Patient taking differently: Take 500 mg by mouth at bedtime. Take 1 tablet every night)  . Menthol, Topical Analgesic, (STOPAIN ROLL-ON EX) Apply 1 application topically daily as needed (pain).  Marland Kitchen metolazone (ZAROXOLYN) 2.5 MG tablet Take 2.5 mg by mouth See admin instructions. Take 2.5 mg by mouth Monday through Friday as needed if weight is greater or equal to 269 lbs  . Multiple Vitamins-Minerals (MULTIVITAMIN WITH MINERALS) tablet Take 1 tablet by mouth daily.  Marland Kitchen neomycin-bacitracin-polymyxin (NEOSPORIN) ointment Apply 1 application topically as needed for wound care.  Marland Kitchen OZEMPIC, 1 MG/DOSE, 2 MG/1.5ML SOPN INJECT 1MG INTO THE SKIN ONCE A WEEK  . Pancrelipase, Lip-Prot-Amyl, (ZENPEP) 25000-79000 units CPEP Take 1-2 capsules by mouth See admin instructions. Take 2 capsules in the morning, 1 capsule with lunch, and 2 capsules in the evening.  . potassium chloride SA (KLOR-CON) 20 MEQ tablet Take 40 meq daily, when taking metolazone take a total of 60 meq daily (Patient taking differently: Take 40-60 mEq by mouth See admin instructions. Take 40 meq daily, when taking metolazone take a total of 60 meq daily )  . sertraline (ZOLOFT)  25 MG tablet Take 25 mg by mouth See admin instructions. Take with 50 mg to equal 75 mg at bedtime  . sertraline (ZOLOFT) 50 MG tablet TAKE 1 TABLET EVERY DAY (Patient taking differently: Take 50 mg by mouth See admin instructions. Take with 25 mg to equal 75 mg at bedtime)  . tolnaftate (TINACTIN) 1 % cream Apply 1 application topically daily as needed (foot fungus).   . [DISCONTINUED] OZEMPIC, 1 MG/DOSE, 2 MG/1.5ML SOPN INJECT 1MG INTO THE SKIN ONCE A WEEK (Patient taking differently: Inject 1 mg into the skin every Thursday. )   No facility-administered encounter medications on  file as of 11/02/2019.     Current Diagnosis/Assessment:  Goals Addressed   None     Diabetes   Recent Relevant Labs: Lab Results  Component Value Date/Time   HGBA1C 8.0 (H) 08/24/2019 03:30 PM   HGBA1C 7.5 (H) 05/19/2019 03:05 PM   MICROALBUR 10 10/22/2018 12:45 PM   MICROALBUR <0.7 04/17/2016 09:55 AM   MICROALBUR <0.7 07/01/2015 10:41 AM     Checking BG: {CHL HP Blood Glucose Monitoring Frequency:[phone]}  Recent FBG Readings: Recent pre-meal BG readings: *** Recent 2hr PP BG readings:  *** Recent HS BG readings: *** Patient has failed these meds in past: *** Patient is currently {CHL Controlled/Uncontrolled:[phone]} on the following medications:  -Toujeo 34 units at bedtime -Humalog Kwikpen 2-4 units three times daily as needed for BS >150 -Ozempic 45m weekly  Last diabetic Foot exam:  Lab Results  Component Value Date/Time   HMDIABEYEEXA No Retinopathy 04/15/2019 12:00 AM    Last diabetic Eye exam: No results found for: HMDIABFOOTEX   We discussed: {CHL HP Upstream Pharmacy discussion:[phone]}  Plan  Continue {CHL HP Upstream Pharmacy Plans:[phone]}    Heart Failure   Type: Diastolic  Last ejection fraction: 50-55% 9/20 w/ moderate-severe RV dysfunction NYHA Class: III (marked limitation of activity) AHA HF Stage: {CHL HP Upstream Pharm AHA HF Stage:[phone]}  Patient has failed these meds in past: *** Patient is currently {CHL Controlled/Uncontrolled:[phone]} on the following medications: ***  We discussed {CHL HP Upstream Pharmacy discussion:[phone]}  Plan  Continue {CHL HP Upstream Pharmacy Plans:[phone]}  Hypertensive Heart w/ Chronic diastolic congestive heart failure   Office blood pressures are  BP Readings from Last 3 Encounters:  09/28/19 (!) 190/98  09/11/19 (!) 149/79  09/08/19 140/60    Patient has failed these meds in the past: LOSARTAN***, olmesartan, Bystolic, valsartan,  Patient is currently {CHL  Controlled/Uncontrolled:[phone]} on the following medications:  -Carvedilol 277mtwice daily -Furosemide 4045maily on Saturday and Sunday -Furosemide 80 mg daily Monday through Friday -Hydralazine 100m93mice daily -Isosorbide Mononitrate 60mg63mly -Metolazone 2.5mg o53monday through Friday as needed if weight > 269lbs -Potassium 20mEq 68mblets daily, 3 tablets when taking metolazone  Patient checks BP at home {CHL HP BP Monitoring Frequency:[phone]}  Patient home BP readings are ranging: ***  We discussed {CHL HP Upstream Pharmacy discussion:[phone]}  Plan  Continue {CHL HP Upstream Pharmacy Plans:[phone]}   Hyperlipidemia   Lipid Panel     Component Value Date/Time   CHOL 145 10/22/2018 1446   TRIG 52 10/22/2018 1446   HDL 70 10/22/2018 1446   CHOLHDL 2.1 10/22/2018 1446   CHOLHDL 2.3 05/15/2018 0600   VLDL 24 05/15/2018 0600   LDLCALC 65 10/22/2018 1446   LDLDIRECT 66.0 01/27/2015 1104   LABVLDL 10 10/22/2018 1446     The 10-year ASCVD risk score (Goff DMikey Bussing, et al., 2013) is: 40.5%  Values used to calculate the score:     Age: 89 years     Sex: Female     Is Non-Hispanic African American: Yes     Diabetic: Yes     Tobacco smoker: No     Systolic Blood Pressure: 761 mmHg     Is BP treated: Yes     HDL Cholesterol: 70 mg/dL     Total Cholesterol: 145 mg/dL   Patient has failed these meds in past: *** Patient is currently {CHL Controlled/[phone]} on the following medications:  -Atorvastatin 75m daily  We discussed:  {CHL HP Upstream Pharmacy [phone]}  Plan  Continue {CHL HP Upstream Pharmacy PPJKDT:[id number]}  Seizures  Began March 2009  Patient has failed these meds in past: *** Patient is currently {CHL Controlled/[phone]} on the following medications:  -Lamotrigine 1556mtwice daily -Levetiracetam 50064mvery night   We discussed:  {CHL HP Upstream Pharmacy  [phone]}  Plan  Continue {CHL HP Upstream Pharmacy Plans:[phone]}  Depression   Patient has failed these meds in past: *** Patient is currently {CHL Controlled/[phone]} on the following medications:  -Sertraline 54m24md 50mg59mmg 63medtime  We discussed:  {CHL HP Upstream Pharmacy [phone]}  Plan  Continue {CHL HP Upstream Pharmacy Plans:[phone]}   Rheumatoid Arthritis   Patient has failed these meds in past: *** Patient is currently {CHL Controlled/[phone]} on the following medications: ***  We discussed:  {CHL HP Upstream Pharmacy [phone]}  Plan  Continue {CHL HP Upstream Pharmacy Plans:[id number]}ines   Reviewed and discussed patient's vaccination history.    Immunization History  Administered Date(s) Administered  . Influenza Split 04/11/2011, 06/30/2013  . Influenza, High Dose Seasonal PF 02/14/2017, 04/24/2018  . Influenza-Unspecified 04/07/2014  . PFIZER SARS-COV-2 Vaccination 08/06/2019  . Tdap 05/18/2012  . Zoster 04/14/2014    Plan  Recommended patient receive *** vaccine in *** office/pharmacy.   Medication Management  ***Disadvantaged Pt uses [facility] medications Uses pill box? {Yes or If no, why not?:20788} Pt endorses ***% compliance  We discussed: ***  Plan  {US Pharmacy Plan:[id number]}llow up: *** month phone visit  ***    -Zenpep 25000-79000 2 capsules in the morning, 1 capsule with lunch, and 2 capsules in the evening -Diclofenac 1% gel Apply 2g four times daily as needed -Vitamin B complex w/ Vitamin C daily -Cholecalciferol 1000 units daily -Ventolin HFA prn -Tylenol 500mg 115mablets every 4 hours as needed -Hydrocortisone 1% as needed for itching -Menthol Roll-on as needed for pain -Neosporin as needed for wound care -MVI -Tolnaftate 1% cream daily as needed

## 2019-11-03 ENCOUNTER — Telehealth: Payer: Self-pay

## 2019-11-05 NOTE — Chronic Care Management (AMB) (Signed)
  Care Management   Note  11/05/2019 Name: LUNA WANKO MRN: [id number] DOB: 21-Mar-1946  [name] is a 74 y.o. year old female who is a primary care patient of [name], MD and is actively engaged with the care management team. I reached out to [name] by phone today to assist with re-scheduling an initial visit with the Pharmacist  Follow up plan: Telephone appointment with care management team member scheduled for:12/01/2019  Chevi Lim  Care Guide, [facility]  [place], [place] [address] Direct Dial: [email or web address] Website: [email or web address]

## 2019-11-06 ENCOUNTER — Other Ambulatory Visit (HOSPITAL_COMMUNITY)
Admission: RE | Admit: 2019-11-06 | Discharge: 2019-11-06 | Disposition: A | Discharge status: Home / Self Care | Payer: Medicare HMO | Source: Ambulatory Visit | Attending: Cardiology | Admitting: Cardiology

## 2019-11-06 ENCOUNTER — Other Ambulatory Visit: Payer: Self-pay

## 2019-11-06 ENCOUNTER — Ambulatory Visit (HOSPITAL_COMMUNITY)
Admission: RE | Admit: 2019-11-06 | Discharge: 2019-11-06 | Disposition: A | Discharge status: Home / Self Care | Payer: Medicare HMO | Source: Ambulatory Visit | Attending: Cardiology | Admitting: Cardiology

## 2019-11-06 DIAGNOSIS — F329 Major depressive disorder, single episode, unspecified: Secondary | ICD-10-CM | POA: Diagnosis present

## 2019-11-06 DIAGNOSIS — Z886 Allergy status to analgesic agent status: Secondary | ICD-10-CM | POA: Diagnosis not present

## 2019-11-06 DIAGNOSIS — Z885 Allergy status to narcotic agent status: Secondary | ICD-10-CM | POA: Diagnosis not present

## 2019-11-06 DIAGNOSIS — R55 Syncope and collapse: Secondary | ICD-10-CM | POA: Diagnosis not present

## 2019-11-06 DIAGNOSIS — Z6841 Body Mass Index (BMI) 40.0 and over, adult: Secondary | ICD-10-CM | POA: Diagnosis not present

## 2019-11-06 DIAGNOSIS — E785 Hyperlipidemia, unspecified: Secondary | ICD-10-CM | POA: Diagnosis present

## 2019-11-06 DIAGNOSIS — E1165 Type 2 diabetes mellitus with hyperglycemia: Secondary | ICD-10-CM | POA: Diagnosis not present

## 2019-11-06 DIAGNOSIS — I959 Hypotension, unspecified: Secondary | ICD-10-CM | POA: Diagnosis not present

## 2019-11-06 DIAGNOSIS — R410 Disorientation, unspecified: Secondary | ICD-10-CM | POA: Diagnosis not present

## 2019-11-06 DIAGNOSIS — J449 Chronic obstructive pulmonary disease, unspecified: Secondary | ICD-10-CM | POA: Diagnosis present

## 2019-11-06 DIAGNOSIS — H409 Unspecified glaucoma: Secondary | ICD-10-CM | POA: Diagnosis present

## 2019-11-06 DIAGNOSIS — R569 Unspecified convulsions: Secondary | ICD-10-CM | POA: Diagnosis not present

## 2019-11-06 DIAGNOSIS — I5032 Chronic diastolic (congestive) heart failure: Secondary | ICD-10-CM

## 2019-11-06 DIAGNOSIS — G2111 Neuroleptic induced parkinsonism: Secondary | ICD-10-CM | POA: Diagnosis present

## 2019-11-06 DIAGNOSIS — I509 Heart failure, unspecified: Secondary | ICD-10-CM | POA: Diagnosis not present

## 2019-11-06 DIAGNOSIS — E1122 Type 2 diabetes mellitus with diabetic chronic kidney disease: Secondary | ICD-10-CM | POA: Diagnosis present

## 2019-11-06 DIAGNOSIS — I471 Supraventricular tachycardia: Secondary | ICD-10-CM | POA: Diagnosis present

## 2019-11-06 DIAGNOSIS — I272 Pulmonary hypertension, unspecified: Secondary | ICD-10-CM | POA: Diagnosis not present

## 2019-11-06 DIAGNOSIS — G4733 Obstructive sleep apnea (adult) (pediatric): Secondary | ICD-10-CM | POA: Diagnosis not present

## 2019-11-06 DIAGNOSIS — Z79899 Other long term (current) drug therapy: Secondary | ICD-10-CM | POA: Diagnosis not present

## 2019-11-06 DIAGNOSIS — N179 Acute kidney failure, unspecified: Secondary | ICD-10-CM | POA: Diagnosis present

## 2019-11-06 DIAGNOSIS — E1142 Type 2 diabetes mellitus with diabetic polyneuropathy: Secondary | ICD-10-CM | POA: Diagnosis present

## 2019-11-06 DIAGNOSIS — E662 Morbid (severe) obesity with alveolar hypoventilation: Secondary | ICD-10-CM | POA: Diagnosis present

## 2019-11-06 DIAGNOSIS — Z794 Long term (current) use of insulin: Secondary | ICD-10-CM | POA: Diagnosis not present

## 2019-11-06 DIAGNOSIS — Z01812 Encounter for preprocedural laboratory examination: Secondary | ICD-10-CM | POA: Insufficient documentation

## 2019-11-06 DIAGNOSIS — R42 Dizziness and giddiness: Secondary | ICD-10-CM | POA: Diagnosis not present

## 2019-11-06 DIAGNOSIS — G40909 Epilepsy, unspecified, not intractable, without status epilepticus: Secondary | ICD-10-CM | POA: Diagnosis present

## 2019-11-06 DIAGNOSIS — J9611 Chronic respiratory failure with hypoxia: Secondary | ICD-10-CM | POA: Diagnosis present

## 2019-11-06 DIAGNOSIS — I5082 Biventricular heart failure: Secondary | ICD-10-CM | POA: Diagnosis present

## 2019-11-06 DIAGNOSIS — R062 Wheezing: Secondary | ICD-10-CM | POA: Diagnosis not present

## 2019-11-06 DIAGNOSIS — K861 Other chronic pancreatitis: Secondary | ICD-10-CM | POA: Diagnosis not present

## 2019-11-06 DIAGNOSIS — Z20822 Contact with and (suspected) exposure to covid-19: Secondary | ICD-10-CM | POA: Diagnosis present

## 2019-11-06 DIAGNOSIS — Z882 Allergy status to sulfonamides status: Secondary | ICD-10-CM | POA: Diagnosis not present

## 2019-11-06 DIAGNOSIS — I110 Hypertensive heart disease with heart failure: Secondary | ICD-10-CM | POA: Diagnosis not present

## 2019-11-06 DIAGNOSIS — Z87891 Personal history of nicotine dependence: Secondary | ICD-10-CM | POA: Diagnosis not present

## 2019-11-06 DIAGNOSIS — J9621 Acute and chronic respiratory failure with hypoxia: Secondary | ICD-10-CM | POA: Diagnosis not present

## 2019-11-06 DIAGNOSIS — E1136 Type 2 diabetes mellitus with diabetic cataract: Secondary | ICD-10-CM | POA: Diagnosis present

## 2019-11-06 DIAGNOSIS — R0902 Hypoxemia: Secondary | ICD-10-CM | POA: Diagnosis not present

## 2019-11-06 DIAGNOSIS — I130 Hypertensive heart and chronic kidney disease with heart failure and stage 1 through stage 4 chronic kidney disease, or unspecified chronic kidney disease: Secondary | ICD-10-CM | POA: Diagnosis present

## 2019-11-06 LAB — PROTIME-INR
INR: 1.1 (ref 0.8–1.2)
Prothrombin Time: 14.1 seconds (ref 11.4–15.2)

## 2019-11-06 LAB — CBC
HCT: 34.9 % — ABNORMAL LOW (ref 36.0–46.0)
Hemoglobin: 10.9 g/dL — ABNORMAL LOW (ref 12.0–15.0)
MCH: 23.2 pg — ABNORMAL LOW (ref 26.0–34.0)
MCHC: 31.2 g/dL (ref 30.0–36.0)
MCV: 74.3 fL — ABNORMAL LOW (ref 80.0–100.0)
Platelets: 292 10*3/uL (ref 150–400)
RBC: 4.7 MIL/uL (ref 3.87–5.11)
RDW: 16.5 % — ABNORMAL HIGH (ref 11.5–15.5)
WBC: 4.9 10*3/uL (ref 4.0–10.5)
nRBC: 0 % (ref 0.0–0.2)

## 2019-11-06 LAB — BASIC METABOLIC PANEL
Anion gap: 10 (ref 5–15)
BUN: 22 mg/dL (ref 8–23)
CO2: 27 mmol/L (ref 22–32)
Calcium: 9.4 mg/dL (ref 8.9–10.3)
Chloride: 99 mmol/L (ref 98–111)
Creatinine, Ser: 1.16 mg/dL — ABNORMAL HIGH (ref 0.44–1.00)
GFR calc Af Amer: 55 mL/min — ABNORMAL LOW (ref >60)
GFR calc non Af Amer: 47 mL/min — ABNORMAL LOW (ref >60)
Glucose, Bld: 117 mg/dL — ABNORMAL HIGH (ref 70–99)
Potassium: 3.8 mmol/L (ref 3.5–5.1)
Sodium: 136 mmol/L (ref 135–145)

## 2019-11-06 LAB — SARS CORONAVIRUS 2 (TAT 6-24 HRS): SARS Coronavirus 2: NEGATIVE

## 2019-11-08 ENCOUNTER — Encounter (HOSPITAL_COMMUNITY): Payer: Self-pay

## 2019-11-08 ENCOUNTER — Other Ambulatory Visit: Payer: Self-pay

## 2019-11-08 ENCOUNTER — Inpatient Hospital Stay (HOSPITAL_COMMUNITY)
Admission: EM | Admit: 2019-11-08 | Discharge: 2019-11-11 | DRG: 982 | Disposition: A | Discharge status: Home Health | Payer: Medicare HMO | Attending: Internal Medicine | Admitting: Internal Medicine

## 2019-11-08 ENCOUNTER — Other Ambulatory Visit (HOSPITAL_COMMUNITY): Payer: Self-pay

## 2019-11-08 DIAGNOSIS — E662 Morbid (severe) obesity with alveolar hypoventilation: Secondary | ICD-10-CM | POA: Diagnosis present

## 2019-11-08 DIAGNOSIS — Z885 Allergy status to narcotic agent status: Secondary | ICD-10-CM

## 2019-11-08 DIAGNOSIS — I130 Hypertensive heart and chronic kidney disease with heart failure and stage 1 through stage 4 chronic kidney disease, or unspecified chronic kidney disease: Secondary | ICD-10-CM | POA: Diagnosis present

## 2019-11-08 DIAGNOSIS — M069 Rheumatoid arthritis, unspecified: Secondary | ICD-10-CM | POA: Diagnosis present

## 2019-11-08 DIAGNOSIS — K861 Other chronic pancreatitis: Secondary | ICD-10-CM | POA: Diagnosis present

## 2019-11-08 DIAGNOSIS — N179 Acute kidney failure, unspecified: Secondary | ICD-10-CM | POA: Diagnosis present

## 2019-11-08 DIAGNOSIS — I272 Pulmonary hypertension, unspecified: Secondary | ICD-10-CM | POA: Diagnosis present

## 2019-11-08 DIAGNOSIS — Z886 Allergy status to analgesic agent status: Secondary | ICD-10-CM

## 2019-11-08 DIAGNOSIS — I471 Supraventricular tachycardia: Secondary | ICD-10-CM | POA: Diagnosis present

## 2019-11-08 DIAGNOSIS — Z882 Allergy status to sulfonamides status: Secondary | ICD-10-CM

## 2019-11-08 DIAGNOSIS — Z79899 Other long term (current) drug therapy: Secondary | ICD-10-CM

## 2019-11-08 DIAGNOSIS — H409 Unspecified glaucoma: Secondary | ICD-10-CM | POA: Diagnosis present

## 2019-11-08 DIAGNOSIS — G2111 Neuroleptic induced parkinsonism: Secondary | ICD-10-CM | POA: Diagnosis present

## 2019-11-08 DIAGNOSIS — Z6841 Body Mass Index (BMI) 40.0 and over, adult: Secondary | ICD-10-CM

## 2019-11-08 DIAGNOSIS — I5032 Chronic diastolic (congestive) heart failure: Secondary | ICD-10-CM | POA: Diagnosis present

## 2019-11-08 DIAGNOSIS — E119 Type 2 diabetes mellitus without complications: Secondary | ICD-10-CM | POA: Diagnosis present

## 2019-11-08 DIAGNOSIS — Z794 Long term (current) use of insulin: Secondary | ICD-10-CM

## 2019-11-08 DIAGNOSIS — E1122 Type 2 diabetes mellitus with diabetic chronic kidney disease: Secondary | ICD-10-CM | POA: Diagnosis present

## 2019-11-08 DIAGNOSIS — T43505A Adverse effect of unspecified antipsychotics and neuroleptics, initial encounter: Secondary | ICD-10-CM | POA: Diagnosis present

## 2019-11-08 DIAGNOSIS — E1136 Type 2 diabetes mellitus with diabetic cataract: Secondary | ICD-10-CM | POA: Diagnosis present

## 2019-11-08 DIAGNOSIS — K219 Gastro-esophageal reflux disease without esophagitis: Secondary | ICD-10-CM | POA: Diagnosis present

## 2019-11-08 DIAGNOSIS — N182 Chronic kidney disease, stage 2 (mild): Secondary | ICD-10-CM | POA: Diagnosis present

## 2019-11-08 DIAGNOSIS — N183 Chronic kidney disease, stage 3 unspecified: Secondary | ICD-10-CM | POA: Diagnosis present

## 2019-11-08 DIAGNOSIS — G40909 Epilepsy, unspecified, not intractable, without status epilepticus: Principal | ICD-10-CM

## 2019-11-08 DIAGNOSIS — Z20822 Contact with and (suspected) exposure to covid-19: Secondary | ICD-10-CM | POA: Diagnosis present

## 2019-11-08 DIAGNOSIS — R55 Syncope and collapse: Secondary | ICD-10-CM | POA: Diagnosis present

## 2019-11-08 DIAGNOSIS — Z8673 Personal history of transient ischemic attack (TIA), and cerebral infarction without residual deficits: Secondary | ICD-10-CM

## 2019-11-08 DIAGNOSIS — E11649 Type 2 diabetes mellitus with hypoglycemia without coma: Secondary | ICD-10-CM | POA: Diagnosis present

## 2019-11-08 DIAGNOSIS — I110 Hypertensive heart disease with heart failure: Secondary | ICD-10-CM | POA: Diagnosis present

## 2019-11-08 DIAGNOSIS — R29810 Facial weakness: Secondary | ICD-10-CM | POA: Diagnosis present

## 2019-11-08 DIAGNOSIS — Z87891 Personal history of nicotine dependence: Secondary | ICD-10-CM

## 2019-11-08 DIAGNOSIS — J449 Chronic obstructive pulmonary disease, unspecified: Secondary | ICD-10-CM | POA: Diagnosis present

## 2019-11-08 DIAGNOSIS — I5082 Biventricular heart failure: Secondary | ICD-10-CM | POA: Diagnosis present

## 2019-11-08 DIAGNOSIS — E1142 Type 2 diabetes mellitus with diabetic polyneuropathy: Secondary | ICD-10-CM | POA: Diagnosis present

## 2019-11-08 DIAGNOSIS — F329 Major depressive disorder, single episode, unspecified: Secondary | ICD-10-CM | POA: Diagnosis present

## 2019-11-08 DIAGNOSIS — E785 Hyperlipidemia, unspecified: Secondary | ICD-10-CM | POA: Diagnosis present

## 2019-11-08 DIAGNOSIS — J9611 Chronic respiratory failure with hypoxia: Secondary | ICD-10-CM | POA: Diagnosis present

## 2019-11-08 LAB — URINALYSIS, ROUTINE W REFLEX MICROSCOPIC
Bilirubin Urine: NEGATIVE
Glucose, UA: NEGATIVE mg/dL
Hgb urine dipstick: NEGATIVE
Ketones, ur: NEGATIVE mg/dL
Leukocytes,Ua: NEGATIVE
Nitrite: NEGATIVE
Protein, ur: NEGATIVE mg/dL
Specific Gravity, Urine: 1.005 (ref 1.005–1.030)
pH: 7 (ref 5.0–8.0)

## 2019-11-08 LAB — SARS CORONAVIRUS 2 (TAT 6-24 HRS): SARS Coronavirus 2: NEGATIVE

## 2019-11-08 LAB — BASIC METABOLIC PANEL
Anion gap: 12 (ref 5–15)
BUN: 16 mg/dL (ref 8–23)
CO2: 28 mmol/L (ref 22–32)
Calcium: 9.4 mg/dL (ref 8.9–10.3)
Chloride: 96 mmol/L — ABNORMAL LOW (ref 98–111)
Creatinine, Ser: 1.05 mg/dL — ABNORMAL HIGH (ref 0.44–1.00)
GFR calc Af Amer: >60 mL/min (ref >60)
GFR calc non Af Amer: 53 mL/min — ABNORMAL LOW (ref >60)
Glucose, Bld: 195 mg/dL — ABNORMAL HIGH (ref 70–99)
Potassium: 4.4 mmol/L (ref 3.5–5.1)
Sodium: 136 mmol/L (ref 135–145)

## 2019-11-08 LAB — CBC
HCT: 37.4 % (ref 36.0–46.0)
Hemoglobin: 11.3 g/dL — ABNORMAL LOW (ref 12.0–15.0)
MCH: 22.8 pg — ABNORMAL LOW (ref 26.0–34.0)
MCHC: 30.2 g/dL (ref 30.0–36.0)
MCV: 75.6 fL — ABNORMAL LOW (ref 80.0–100.0)
Platelets: 276 10*3/uL (ref 150–400)
RBC: 4.95 MIL/uL (ref 3.87–5.11)
RDW: 16.7 % — ABNORMAL HIGH (ref 11.5–15.5)
WBC: 4.4 10*3/uL (ref 4.0–10.5)
nRBC: 0 % (ref 0.0–0.2)

## 2019-11-08 LAB — CBG MONITORING, ED
Glucose-Capillary: 74 mg/dL (ref 70–99)
Glucose-Capillary: 156 mg/dL — ABNORMAL HIGH (ref 70–99)

## 2019-11-08 LAB — TROPONIN I (HIGH SENSITIVITY)
Troponin I (High Sensitivity): 8 ng/L (ref <18)
Troponin I (High Sensitivity): 7 ng/L (ref <18)

## 2019-11-08 MED ORDER — INSULIN ASPART 100 UNIT/ML ~~LOC~~ SOLN
0.0000 [IU] | Freq: Three times a day (TID) | SUBCUTANEOUS | Status: DC
  Administered 2019-11-10: 3 [IU] via SUBCUTANEOUS
  Administered 2019-11-11 (×2): 2 [IU] via SUBCUTANEOUS

## 2019-11-08 MED ORDER — TORSEMIDE 20 MG PO TABS
40.0000 mg | ORAL_TABLET | Freq: Every day | ORAL | Status: DC
  Administered 2019-11-09 – 2019-11-11 (×3): 40 mg via ORAL
  Filled 2019-11-08 (×3): qty 2

## 2019-11-08 MED ORDER — SERTRALINE HCL 50 MG PO TABS
75.0000 mg | ORAL_TABLET | Freq: Every day | ORAL | Status: DC
  Administered 2019-11-09 – 2019-11-11 (×3): 75 mg via ORAL
  Filled 2019-11-08 (×3): qty 2

## 2019-11-08 MED ORDER — INSULIN GLARGINE (1 UNIT DIAL) 300 UNIT/ML ~~LOC~~ SOPN: 36.0000 [IU] | PEN_INJECTOR | Freq: Every day | SUBCUTANEOUS | Status: DC

## 2019-11-08 MED ORDER — PANCRELIPASE (LIP-PROT-AMYL) 12000-38000 UNITS PO CPEP
36000.0000 [IU] | ORAL_CAPSULE | ORAL | Status: DC
  Administered 2019-11-10 – 2019-11-11 (×2): 36000 [IU] via ORAL
  Filled 2019-11-08 (×2): qty 3

## 2019-11-08 MED ORDER — INSULIN ASPART 100 UNIT/ML ~~LOC~~ SOLN: 0.0000 [IU] | Freq: Every day | SUBCUTANEOUS | Status: DC

## 2019-11-08 MED ORDER — ONDANSETRON HCL 4 MG/2ML IJ SOLN: 4.0000 mg | Freq: Four times a day (QID) | INTRAMUSCULAR | Status: DC | PRN

## 2019-11-08 MED ORDER — ONDANSETRON HCL 4 MG PO TABS: 4.0000 mg | ORAL_TABLET | Freq: Four times a day (QID) | ORAL | Status: DC | PRN

## 2019-11-08 MED ORDER — CARVEDILOL 25 MG PO TABS
25.0000 mg | ORAL_TABLET | Freq: Two times a day (BID) | ORAL | Status: DC
  Administered 2019-11-09 – 2019-11-11 (×5): 25 mg via ORAL
  Filled 2019-11-08 (×5): qty 1

## 2019-11-08 MED ORDER — ACETAMINOPHEN 325 MG PO TABS
650.0000 mg | ORAL_TABLET | Freq: Four times a day (QID) | ORAL | Status: DC | PRN
  Administered 2019-11-10: 650 mg via ORAL
  Filled 2019-11-08: qty 2

## 2019-11-08 MED ORDER — ATORVASTATIN CALCIUM 10 MG PO TABS
10.0000 mg | ORAL_TABLET | Freq: Every evening | ORAL | Status: DC
  Administered 2019-11-09 – 2019-11-10 (×2): 10 mg via ORAL
  Filled 2019-11-08 (×2): qty 1

## 2019-11-08 MED ORDER — HEPARIN SODIUM (PORCINE) 5000 UNIT/ML IJ SOLN
5000.0000 [IU] | Freq: Three times a day (TID) | INTRAMUSCULAR | Status: DC
  Administered 2019-11-08 – 2019-11-09 (×2): 5000 [IU] via SUBCUTANEOUS
  Filled 2019-11-08 (×2): qty 1

## 2019-11-08 MED ORDER — LAMOTRIGINE 100 MG PO TABS
150.0000 mg | ORAL_TABLET | Freq: Two times a day (BID) | ORAL | Status: DC
  Administered 2019-11-08 – 2019-11-11 (×6): 150 mg via ORAL
  Filled 2019-11-08 (×6): qty 2

## 2019-11-08 MED ORDER — INSULIN ASPART 100 UNIT/ML ~~LOC~~ SOLN: 6.0000 [IU] | Freq: Three times a day (TID) | SUBCUTANEOUS | Status: DC

## 2019-11-08 MED ORDER — PANCRELIPASE (LIP-PROT-AMYL) 12000-38000 UNITS PO CPEP
ORAL_CAPSULE | Freq: Two times a day (BID) | ORAL | Status: DC
  Administered 2019-11-09 – 2019-11-11 (×4): 12000 [IU] via ORAL
  Filled 2019-11-08: qty 3
  Filled 2019-11-08 (×2): qty 1
  Filled 2019-11-08: qty 2
  Filled 2019-11-08: qty 3

## 2019-11-08 MED ORDER — ACETAMINOPHEN 650 MG RE SUPP: 650.0000 mg | Freq: Four times a day (QID) | RECTAL | Status: DC | PRN

## 2019-11-08 MED ORDER — SODIUM CHLORIDE 0.9% FLUSH: 3.0000 mL | Freq: Once | INTRAVENOUS | Status: DC

## 2019-11-08 MED ORDER — POLYETHYLENE GLYCOL 3350 17 G PO PACK: 17.0000 g | PACK | Freq: Every day | ORAL | Status: DC | PRN

## 2019-11-08 MED ORDER — HYDRALAZINE HCL 50 MG PO TABS
100.0000 mg | ORAL_TABLET | Freq: Two times a day (BID) | ORAL | Status: DC
  Administered 2019-11-08 – 2019-11-11 (×6): 100 mg via ORAL
  Filled 2019-11-08 (×7): qty 2

## 2019-11-08 MED ORDER — SODIUM CHLORIDE 0.9% FLUSH
3.0000 mL | Freq: Two times a day (BID) | INTRAVENOUS | Status: DC
  Administered 2019-11-09 – 2019-11-11 (×5): 3 mL via INTRAVENOUS

## 2019-11-08 MED ORDER — ISOSORBIDE MONONITRATE ER 60 MG PO TB24
60.0000 mg | ORAL_TABLET | Freq: Every day | ORAL | Status: DC
  Administered 2019-11-09 – 2019-11-11 (×3): 60 mg via ORAL
  Filled 2019-11-08 (×3): qty 1

## 2019-11-08 MED ORDER — LEVETIRACETAM 500 MG PO TABS
500.0000 mg | ORAL_TABLET | Freq: Two times a day (BID) | ORAL | Status: DC
  Administered 2019-11-08 – 2019-11-11 (×6): 500 mg via ORAL
  Filled 2019-11-08 (×6): qty 1

## 2019-11-08 MED ORDER — ACETAMINOPHEN 500 MG PO TABS
500.0000 mg | ORAL_TABLET | ORAL | Status: DC | PRN
  Administered 2019-11-09: 500 mg via ORAL
  Administered 2019-11-10: 1000 mg via ORAL
  Filled 2019-11-08 (×2): qty 2

## 2019-11-08 MED ORDER — INSULIN GLARGINE 100 UNIT/ML ~~LOC~~ SOLN
36.0000 [IU] | Freq: Every day | SUBCUTANEOUS | Status: DC
  Administered 2019-11-09: 36 [IU] via SUBCUTANEOUS
  Filled 2019-11-08 (×2): qty 0.36

## 2019-11-08 NOTE — ED Notes (Signed)
[name] sister [phone] call with any updates

## 2019-11-08 NOTE — ED Notes (Signed)
[phone], [name], Son and daughter in law would like an update.

## 2019-11-08 NOTE — H&P (View-Only) (Signed)
Cardiology Consultation:   Patient ID: [name] MRN: [id number]; DOB: 1945-07-18  Admit date: 11/08/2019 Date of Consult: 11/08/2019  Primary Care Provider: [name], MD Primary Cardiologist: Glenetta Hew, MD  Primary Electrophysiologist:  None    Patient Profile:   [name] is a 74 y.o. female with a hx of OHS/OSA, CVA, seizure disorder, type 2 diabetes, hypertension, hyperlipidemia, chronic diastolic heart failure, pulmonary hypertension who is being seen today for the evaluation of syncope at the request of Dr Langston Masker.  History of Present Illness:   [name] is a 74 year old female with the above medical history who presents with syncope.  She reports that she felt very fatigued all morning.  She thought she might be hypoglycemic, but when she checked her blood glucose it was 87.  She ate this morning with no improvement in her symptoms.  She states she was getting ready to take a bath and was leaning over her bed when she started feeling like she was going to pass out.  States it felt like heart was racing.  Her sister came in and helped her to the ground.  States she thinks she was unconscious for a few minutes.  Reported sharp left-sided chest pain that lasted a few seconds prior to the episode.   TTE 02/2019 showed LVEF 50 to 55%, moderate to severe RV dilatation with mild to moderately decreased RV systolic function.  RHC/LHC (03/2019) showed normal coronary arteries, RA 10, PA 56/14/31, PCWP 13, LVEDP 18, PVR 2.44.  She underwent a VQ scan 07/2019 which showed no evidence for acute or chronic PE.  She follows with Dr. [name], planning cardiomems placement tomorrow.  In the ED, vital signs were BP 118/57, pulse 64, SPO2 99% on room air, afebrile.  Labs notable for creatinine 1.05, hemoglobin 11.3, platelets 276, WBCs 4.4, Covid negative on 5/14.  EKG shows sinus rhythm, rate 64, no ST changes  Past Medical History:  Diagnosis Date  . Abnormal liver function    in the past.  . Anemia   . Arthritis    all over  . Bruises easily   . Cataract    right eye;immature  . Chronic back pain    stenosis  . Chronic cough   . Chronic kidney disease   . COPD (chronic obstructive pulmonary disease) ([place])   . Demand myocardial infarction [facility]) 2012   Demand Infarction in setting of Pancreatitis --> mild Troponin elevation, NON-OBSTRUCTIVE CAD  . Depression    takes Abilify daily as well as Zoloft  . Diastolic heart failure [id number]   Grade 1 diastolic Dysfunction by Echo   . Diverticulosis   . DM (diabetes mellitus) ([place])    takes Victoza daily as well as Lantus and Humalog  . Empty sella ([place])    on MRI in 2009.  . Glaucoma   . Headache(784.0)    last migraine-4-32yrs ago  . History of blood transfusion    no abnormal reaction noted  . History of colon polyps    benign  . HTN (hypertension)    takes [employer] daily  . Hyperlipidemia    takes Lipitor daily  . Joint swelling   . Nocturia   . Obesity hypoventilation syndrome ([place])   . Obstructive sleep apnea 02/2018   Notably improved split-night study with weight loss from 270 (2017) down to 250 pounds (2019).  . Pancreatitis    takes Pancrelipase daily  . Parkinson's disease ([place])    takes Sinemet daily  .  Peripheral neuropathy   . Pneumonia 2012  . Seizures ([place])    takes Lamictal daily and Primidone nightly;last seizure 2wks ago  . Urinary urgency    With increased frequency  . Varicose veins of both lower extremities with pain    With edema.  Takes daily Lasix    Past Surgical History:  Procedure Laterality Date  . ABDOMINAL HYSTERECTOMY    . APPENDECTOMY    . BACK SURGERY    . Cardiac Event Monitor  September-October 2017   Sinus rhythm with occasional PACs and artifact. No arrhythmias besides one short run of tachycardia.  . CHOLECYSTECTOMY    . COLONOSCOPY N/A 08/15/2012   Procedure: COLONOSCOPY;  Surgeon: Arta Silence, MD;  Location: [facility];  Service:  Endoscopy;  Laterality: N/A;  . COLONOSCOPY WITH PROPOFOL Left 06/17/2017   Procedure: COLONOSCOPY WITH PROPOFOL;  Surgeon: [name], MD;  Location: [place];  Service: Gastroenterology;  Laterality: Left;  . CORONARY CALCIUM SCORE AND CTA  04/2018   Coronary calcium score 71.9.  Very large, hyperdynamic LAD wrapping apex giving rise to PDA.  Moderate LAD-diagonal and circumflex plaque -> CT FFR suggested positive findings in distal D1, D2 and distal circumflex.  Referred for cath. ==> FALSE POSITIVE  . ESOPHAGOGASTRODUODENOSCOPY    . eye cysts Bilateral   . LASIK    . LEFT HEART CATH AND CORONARY ANGIOGRAPHY N/A 05/16/2018   Procedure: LEFT HEART CATH AND CORONARY ANGIOGRAPHY;  Surgeon: Belva Crome, MD;  Location: [facility] INVASIVE CV LAB:  Angiographically normal coronary arteries with LVEDP of 21 mmHg.  Large draping hyperdominant LAD that wraps the apex and provides distal half of the PDA.  Relatively small caliber distal Cx -->proxLPDA, non-dom RCA. No Cx or Diag lesions -- FALSE + CT FFR  . LEFT HEART CATH AND CORONARY ANGIOGRAPHY  2009/March 2012   2009: (Dr. Alvie Heidelberg) Nonobstructive CAD; 2012: Minimal CAD --> false positive stress test  . NM MYOVIEW LTD  01/2016; 01/2018   a)LOW RISK. No ischemia or infarction;; b) EF 55-60%. NO ST changes.  No ischemia or Infarction. NO significant RV enlargement.  LOW RISK.  Marland Kitchen Polysomnogram  02/2018   (Dr. Brett Fairy from neurology): Split study was not adequately done due to low AHI.  She slept reclined and had an AHI less than 10, prolonged hypoxemia and no hypercapnia. -->  She has home oxygen already prescribed, but did not meet criteria for nightly oxygen.  (Was noted that the patient did not cooperate well with study.  Titration was discussed.  Marland Kitchen RECTAL POLYPECTOMY    . RIGHT/LEFT HEART CATH AND CORONARY ANGIOGRAPHY N/A 04/06/2019   Procedure: RIGHT/LEFT HEART CATH AND CORONARY ANGIOGRAPHY;  Surgeon: Leonie Man, MD;  Location: [facility] INVASIVE CV  LAB; angiographically normal coronary arteries.  Moderately elevated AD LVEDP..  Mild pulmonary hypertension: PA P 56/14 mmHg-mean 31 mmHg.  RAP 10 mmHg.  RV P-EDP 54/4 mmHg - 12 mmHg.  PCWP 11-15 mmHg.  CO-CI: 7.35-3.73.  . TONSILLECTOMY    . TRANSTHORACIC ECHOCARDIOGRAM  01/2016; 05/2017   A) Normal LV chamber size with mod LVH pattern. EF 50-55%. Severe LA dilation. Mod RA dilation. PAP elevated at 38 mmHg;; B) Mild concentric LVH.  EF 55-60% & no RWMA.  Grade 2 DD.  Diastolic flattening of the ventricular septum == ? elevated PAP.  Mild aortic valve calcification/sclerosis.  Mod LA dilation.  Mild RV dilation & Mod RA dilation  . TRANSTHORACIC ECHOCARDIOGRAM  10/2017; 01/2018   a) EF  55-60%.  No RWMA,  Moderate LA dilation.  Mild LA dilation.  Peak PA pressures ~57 mmHg (moderate);;; b) EF 55-60%.  GRII DD.  Suggestion of RV volume overload with moderate RV dilation.  Severe LA and RA dilation.Marland Kitchen  PA pressure ~67%.  . TRANSTHORACIC ECHOCARDIOGRAM  01/2019   Normal LV size and function.  EF 50 to 55%.  Impaired relaxation.  RV appears to have moderately reduced function.  Severely enlarged.  Cannot fully assess RV pressures.  Hypermobile interatrial septum.  Right atrium severely dilated.  . TUBAL LIGATION    . vaginal cyst removed     several times       Inpatient Medications: Scheduled Meds: . sodium chloride flush  3 mL Intravenous Once   Continuous Infusions:  PRN Meds:   Allergies:    Allergies  Allergen Reactions  . Other Anaphylaxis and Rash    Bleach  . Penicillins Hives    Did it involve swelling of the face/tongue/throat, SOB, or low BP? No Did it involve sudden or severe rash/hives, skin peeling, or any reaction on the inside of your mouth or nose? Yes Did you need to seek medical attention at a hospital or doctor's office? Yes When did it last happen?Childhood allergy  If all above answers are "NO", may proceed with cephalosporin use.    . Sulfa Antibiotics  Hives  . Aspirin Other (See Comments)     On aspirin 81 mg - Rectal bleeding in dec 2018  . Codeine     Headache and makes the patient feel "off"    Social History:   Social History   Socioeconomic History  . Marital status: Divorced    Spouse name: Not on file  . Number of children: Not on file  . Years of education: Not on file  . Highest education level: Not on file  Occupational History  . Occupation: Disabled    Comment: CNA  Tobacco Use  . Smoking status: Former Smoker    Packs/day: 0.50    Years: 25.00    Pack years: 12.50    Types: Cigarettes  . Smokeless tobacco: Never Used  . Tobacco comment: quit smoking 20+ytrs ago  Substance and Sexual Activity  . Alcohol use: No  . Drug use: No  . Sexual activity: Not Currently    Birth control/protection: Surgical  Other Topics Concern  . Not on file  Social History Narrative   She lives in [place]. She has lots of family in the area and is accompanied by her sister.   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   She has a distant history of smoking, quit 20 years ago.   Social Determinants of Health   Financial Resource Strain:   . Difficulty of Paying Living Expenses:   Food Insecurity:   . Worried About Charity fundraiser in the Last Year:   . Arboriculturist in the Last Year:   Transportation Needs:   . Film/video editor (Medical):   Marland Kitchen Lack of Transportation (Non-Medical):   Physical Activity:   . Days of Exercise per Week:   . Minutes of Exercise per Session:   Stress:   . Feeling of Stress :   Social Connections:   . Frequency of Communication with Friends and Family:   . Frequency of Social Gatherings with Friends and Family:   . Attends Religious Services:   . Active Member of Clubs or Organizations:   .  Attends Archivist Meetings:   Marland Kitchen Marital Status:   Intimate Partner Violence:   . Fear of Current or Ex-Partner:   . Emotionally Abused:   Marland Kitchen  Physically Abused:   . Sexually Abused:     Family History:    Family History  Problem Relation Age of Onset  . Allergies Father   . Heart disease Father        before 74  . Heart failure Father   . Hypertension Father   . Hyperlipidemia Father   . Heart disease Mother   . Diabetes Mother   . Hypertension Mother   . Hyperlipidemia Mother   . Hypertension Sister   . Heart disease Sister        before 77  . Hyperlipidemia Sister   . Diabetes Brother   . Hypertension Brother   . Diabetes Sister   . Hypertension Sister   . Diabetes Son   . Hypertension Son   . Cancer Other      ROS:  Please see the history of present illness.   All other ROS reviewed and negative.     Physical Exam/Data:   Vitals:   11/08/19 1155 11/08/19 1200 11/08/19 1215  BP: (!) 118/57 123/71 (!) 145/67  Pulse: 64 (!) 55 65  Resp: 16 20 16   Temp: 98.3 F (36.8 C)    TempSrc: Oral    SpO2: 99% 92% 96%   No intake or output data in the 24 hours ending 11/08/19 1435 Last 3 Weights 09/28/2019 09/11/2019 09/08/2019  Weight (lbs) (No Data) 220 lb 9.6 oz 221 lb 9.6 oz  Weight (kg) (No Data) 100.064 kg 100.517 kg     There is no height or weight on file to calculate BMI.  General:   in no acute distress HEENT: normal Neck: + JVD Cardiac:  RRR; no murmur  Lungs:  clear to auscultation bilaterally, no wheezing, rhonchi or rales  Abd: soft, nontender, no hepatomegaly  Ext: no edema Musculoskeletal:  No deformities, BUE and BLE strength normal and equal Skin: warm and dry  Neuro:  CNs 2-12 intact, no focal abnormalities noted Psych:  Normal affect   EKG:  The EKG was personally reviewed and demonstrates:  sinus rhythm, rate 64, no ST changes Telemetry:  Telemetry was personally reviewed and demonstrates: sinus bradycardia, rate 50-60s  Relevant CV Studies: Cardiac monitor 02/27/09:  Predominant rhythm sinus with rates ranging from 52 to 92 bpm. Average 65 bpm.  Rare, <1% PACs or PVCs. No  couplets or triplets noted, and very rare ventricular bigeminy noted.  Total of 20 short bursts of PSVT/PAT ranging from 5-10 beats in heart rate from 103 to 132 bpm.  Most of patient's symptoms were noted with sinus rhythm, or sinus rhythm with rare PACs/PVCs. Symptoms not noted during episodes of PSVT or PAT.  No atrial fibrillation, atrial flutter or prolonged PSVT/PAT noted.  No bradycardia events noted. Sinus bradycardia noted during sleeping hours.   Overall, relatively reassuring study.  There were several short little bursts of fast heart rates, but not noted on symptoms.  Probably not long enough to make patient feel lightheaded, dizzy or short of breath.  Would only be felt as a quick little bursts of fluttering.  Would not be inclined to treat.  RHC/RHC 04/06/19:  Hemodynamic findings consistent with mild pulmonary hypertension -with moderately elevated LVEDP  There is no aortic valve stenosis.  Angiographically normal coronary arteries with LEFT-LAD dominant system   SUMMARY  Angiographically  normal coronary arteries with essentially LAD dominant system.  Right Heart Cath pressures show mild-moderate pulmonary hypertension with elevated LVEDP suggesting combination of diastolic dysfunction related as well as obesity hypoventilation related pulmonary hypertension  Normal/hyperdynamic cardiac output and index.   RECOMMENDATIONS  Discharge home after bedrest.  For now continue current medications as she seems to be adequately diuresed.  We will need to avoid overdiuresis in the future. -->  Shorter courses of increased Lasix for worsening edema or dyspnea.  Will review with advanced heart failure team.  TTE 02/25/19: 1. The left ventricle has low normal systolic function, with an ejection  fraction of 50-55%. The cavity size was normal. Left ventricular diastolic  Doppler parameters are consistent with impaired relaxation. No evidence of  left ventricular  regional wall  motion abnormalities.  2. Although TAPSE is measured as normal, the right ventricle visually  appears to have moderate to severely reduced systolic function. The cavity  was severely enlarged. There is no increase in right ventricular wall  thickness. Right ventricular systolic  pressure could not be assessed.  3. Left atrial size was mildly dilated.  4. Hypermobile interatrial septum.  5. Right atrial size was severely dilated.  6. The aorta is normal unless otherwise noted.  7. The inferior vena cava was dilated in size with <50% respiratory  variability.   Laboratory Data:  High Sensitivity Troponin:   Recent Labs  Lab 11/08/19 1306  TROPONINIHS 8     Chemistry Recent Labs  Lab 11/06/19 1142 11/08/19 1306  NA 136 136  K 3.8 4.4  CL 99 96*  CO2 27 28  GLUCOSE 117* 195*  BUN 22 16  CREATININE 1.16* 1.05*  CALCIUM 9.4 9.4  GFRNONAA 47* 53*  GFRAA 55* >60  ANIONGAP 10 12    No results for input(s): PROT, ALBUMIN, AST, ALT, ALKPHOS, BILITOT in the last 168 hours. Hematology Recent Labs  Lab 11/06/19 1142 11/08/19 1306  WBC 4.9 4.4  RBC 4.70 4.95  HGB 10.9* 11.3*  HCT 34.9* 37.4  MCV 74.3* 75.6*  MCH 23.2* 22.8*  MCHC 31.2 30.2  RDW 16.5* 16.7*  PLT 292 276   BNPNo results for input(s): BNP, PROBNP in the last 168 hours.  DDimer No results for input(s): DDIMER in the last 168 hours.   Radiology/Studies:  No results found. {   Assessment and Plan:   Syncope: Unclear cause.  Could be vasovagal as reported prodromal symptoms, but described feeling like heart was racing prior to episode which is suggestive of arrhythmia.  No position change to suggest orthostasis.  Has known pulmonary hypertension, was planning CardioMEMS placement tomorrow -Check TTE -Monitor on telemetry -Would plan to proceed with CardioMEMS placement tomorrow, will let Dr [name] know patient is admitted.  Would make n.p.o. at midnight  HFpEF: Continue PO lasix  80 mg daily.  JVD on exam.  Mild-moderate RV dysfunction, likely due to HFpEF and OHS/OSA.  No PE on VQ scan  Pulmonary hypertension: RHC 03/2019 showed mild to moderate pulmonary venous hypertension.  Planned for CardioMEMS placement tomorrow   For questions or updates, please contact [place] Please consult [email or web address] for contact info under     Signed, Donato Heinz, MD  11/08/2019 2:35 PM

## 2019-11-08 NOTE — ED Triage Notes (Signed)
Pt bib [facility] from home w/ c/o syncopal episode this morning. Pt states she has been feeling more dizzy over the last month. Pt followed by cards and scheduled for a cath tomorrow. Pt denies hitting head, denies blood thinners. AOx4, neuro intact. EMS VSS.

## 2019-11-08 NOTE — ED Provider Notes (Signed)
[place] EMERGENCY DEPARTMENT Provider Note   CSN: [id number] Arrival date & time: 11/08/19  1152     History Chief Complaint  Patient presents with  . Loss of Consciousness    [name] is a 74 y.o. female with a history of diastolic heart failure (EF 50-55% on 02/25/19), pulmonary hypertension, DM2 on insulin, HTN, HLD, obesity, PACs and parox SVT, presented to emergency department syncopal episode.  Patient ports that she woke up today feeling "off."  She cannot describe exactly what her feelings were but reports some very mild nausea.  This occurred after waking up this morning.  Said around approximately 11 AM as she was preparing to take a shower, she began to feel very unwell and somewhat dizzy. But she said she felt like she was going to pass out.  She was trying to hold onto the railing of her bed.  She then lost consciousness.  Is not clear exactly how long she was unconscious.  Currently in the ED she feels awake and back to her baseline self.  She denies any chest pain.  She denies any nausea or vomiting or weakness.  She denies any lightheadedness.  *Update from sister [name], who tells me this morning around 11 am she went to check in on the patient, and "she was sitting on the edge of the bed, holding the rail, gasping for air.  She looked gray.  I helped her to the ground.  She just couldn't catch her breath."  The patient has had similar episodes in the past but "never this bad, never this long, usually she just pauses for a few seconds and then says I'm fine."  She does report that she has an outpatient cath scheduled for tomorrow.  Per the outpatient office visit note from Dr. [name] in March 2020, it appears that the focus of this might be a right heart catheterization to evaluate for evidence of right ventricular dysfunction, which is unclear at this time.  Review of her records shows last Right and left heart cath in Oct 2020, normally  coronary arteries, mild pulm HTN  The patient's reports that she is on 80 mg of Lasix daily, except for 2 days a week when she takes 40 mg.  She has been compliant with her medications.  She did take all of her morning medicines today.  HPI     Past Medical History:  Diagnosis Date  . Abnormal liver function     in the past.  . Anemia   . Arthritis    all over  . Bruises easily   . Cataract    right eye;immature  . Chronic back pain    stenosis  . Chronic cough   . Chronic kidney disease   . COPD (chronic obstructive pulmonary disease) ([place])   . Demand myocardial infarction [facility]) 2012   Demand Infarction in setting of Pancreatitis --> mild Troponin elevation, NON-OBSTRUCTIVE CAD  . Depression    takes Abilify daily as well as Zoloft  . Diastolic heart failure 4081   Grade 1 diastolic Dysfunction by Echo   . Diverticulosis   . DM (diabetes mellitus) ([place])    takes Victoza daily as well as Lantus and Humalog  . Empty sella ([place])    on MRI in 2009.  . Glaucoma   . Headache(784.0)    last migraine-4-65yr ago  . History of blood transfusion    no abnormal reaction noted  . History of colon polyps  benign  . HTN (hypertension)    takes [employer] daily  . Hyperlipidemia    takes Lipitor daily  . Joint swelling   . Nocturia   . Obesity hypoventilation syndrome ([place])   . Obstructive sleep apnea 02/2018   Notably improved split-night study with weight loss from 270 (2017) down to 250 pounds (2019).  . Pancreatitis    takes Pancrelipase daily  . Parkinson's disease ([place])    takes Sinemet daily  . Peripheral neuropathy   . Pneumonia 2012  . Seizures ([place])    takes Lamictal daily and Primidone nightly;last seizure 2wks ago  . Urinary urgency    With increased frequency  . Varicose veins of both lower extremities with pain    With edema.  Takes daily Lasix    Patient Active Problem List   Diagnosis Date Noted  . Neuroleptic-induced Parkinsonism  ([place]) 09/28/2019  . Class 3 severe obesity due to excess calories with serious comorbidity and body mass index (BMI) of 40.0 to 44.9 in adult [facility]) 05/22/2019  . Pulmonary hypertension, unspecified ([place]) 04/06/2019  . Syncope 02/24/2019  . Rheumatoid arthritis ([place]) 01/21/2019  . Type 2 diabetes mellitus with stage 2 chronic kidney disease, with long-term current use of insulin ([place]) 07/21/2018  . Chest pain 05/15/2018  . 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 Excessive daytime sleepiness 01/30/2018  . Sleep-related hypoventilation 01/30/2018  . Recurrent depression ([place]) 11/06/2017  . OSA on CPAP 10/07/2017  . Todd's paralysis ([place])   . Chronic diastolic CHF (congestive heart failure), NYHA class 2 ([place])   . Chronic respiratory failure with hypoxia ([place])   . Acute lower GI bleeding   . Stroke-like symptoms 06/15/2017  . Rectal bleeding 06/15/2017  . Dehydration 03/07/2017  . Medication management 12/24/2016  . Pre-syncope 03/09/2016  . Racing heart beat 03/09/2016  . Spinal stenosis at L4-L5 level 10/04/2014  . Lower abdominal pain 10/31/2012  . Type II diabetes mellitus, uncontrolled ([place]) 10/31/2012  . Seizure disorder ([place]) 10/31/2012  . Diverticulosis 10/31/2012  . Anemia associated with acute blood loss 08/14/2012  . Hematochezia 08/14/2012  . Varicose veins of bilateral lower extremities with other complications 56/21/3086  . Leg pain 03/21/2012  . Radicular pain of left lower extremity 03/21/2012  . Infected pseudocyst of pancreas 10/28/2011  . Chronic pancreatitis ([place]) 10/28/2011  . Hypertensive heart disease with chronic diastolic congestive heart failure ([place]) 10/28/2011  . Dyslipidemia, goal LDL below 100 10/28/2011  . Morbid obesity with BMI of 45.0-49.9, adult ([place]) 10/28/2011  . Peripheral edema 04/14/2011  . Obesity hypoventilation syndrome ([place]) 10/26/2010  . Atherosclerotic heart disease of native  coronary artery with other forms of angina pectoris ([place]) 08/28/2010    Past Surgical History:  Procedure Laterality Date  . ABDOMINAL HYSTERECTOMY    . APPENDECTOMY    . BACK SURGERY    . Cardiac Event Monitor  September-October 2017   Sinus rhythm with occasional PACs and artifact. No arrhythmias besides one short run of tachycardia.  . CHOLECYSTECTOMY    . COLONOSCOPY N/A 08/15/2012   Procedure: COLONOSCOPY;  Surgeon: Arta Silence, MD;  Location: [facility];  Service: Endoscopy;  Laterality: N/A;  . COLONOSCOPY WITH PROPOFOL Left 06/17/2017   Procedure: COLONOSCOPY WITH PROPOFOL;  Surgeon: [name], MD;  Location: [place];  Service: Gastroenterology;  Laterality: Left;  . CORONARY CALCIUM SCORE AND CTA  04/2018   Coronary calcium score 71.9.  Very large, hyperdynamic LAD  wrapping apex giving rise to PDA.  Moderate LAD-diagonal and circumflex plaque -> CT FFR suggested positive findings in distal D1, D2 and distal circumflex.  Referred for cath. ==> FALSE POSITIVE  . ESOPHAGOGASTRODUODENOSCOPY    . eye cysts Bilateral   . LASIK    . LEFT HEART CATH AND CORONARY ANGIOGRAPHY N/A 05/16/2018   Procedure: LEFT HEART CATH AND CORONARY ANGIOGRAPHY;  Surgeon: Belva Crome, MD;  Location: [facility] INVASIVE CV LAB:  Angiographically normal coronary arteries with LVEDP of 21 mmHg.  Large draping hyperdominant LAD that wraps the apex and provides distal half of the PDA.  Relatively small caliber distal Cx -->proxLPDA, non-dom RCA. No Cx or Diag lesions -- FALSE + CT FFR  . LEFT HEART CATH AND CORONARY ANGIOGRAPHY  2009/March 2012   2009: (Dr. Alvie Heidelberg) Nonobstructive CAD; 2012: Minimal CAD --> false positive stress test  . NM MYOVIEW LTD  01/2016; 01/2018   a)LOW RISK. No ischemia or infarction;; b) EF 55-60%. NO ST changes.  No ischemia or Infarction. NO significant RV enlargement.  LOW RISK.  Marland Kitchen Polysomnogram  02/2018   (Dr. Brett Fairy from neurology): Split study was not adequately done due to  low AHI.  She slept reclined and had an AHI less than 10, prolonged hypoxemia and no hypercapnia. -->  She has home oxygen already prescribed, but did not meet criteria for nightly oxygen.  (Was noted that the patient did not cooperate well with study.  Titration was discussed.  Marland Kitchen RECTAL POLYPECTOMY    . RIGHT/LEFT HEART CATH AND CORONARY ANGIOGRAPHY N/A 04/06/2019   Procedure: RIGHT/LEFT HEART CATH AND CORONARY ANGIOGRAPHY;  Surgeon: Leonie Man, MD;  Location: [facility] INVASIVE CV LAB; angiographically normal coronary arteries.  Moderately elevated AD LVEDP..  Mild pulmonary hypertension: PA P 56/14 mmHg-mean 31 mmHg.  RAP 10 mmHg.  RV P-EDP 54/4 mmHg - 12 mmHg.  PCWP 11-15 mmHg.  CO-CI: 7.35-3.73.  . TONSILLECTOMY    . TRANSTHORACIC ECHOCARDIOGRAM  01/2016; 05/2017   A) Normal LV chamber size with mod LVH pattern. EF 50-55%. Severe LA dilation. Mod RA dilation. PAP elevated at 38 mmHg;; B) Mild concentric LVH.  EF 55-60% & no RWMA.  Grade 2 DD.  Diastolic flattening of the ventricular septum == ? elevated PAP.  Mild aortic valve calcification/sclerosis.  Mod LA dilation.  Mild RV dilation & Mod RA dilation  . TRANSTHORACIC ECHOCARDIOGRAM  10/2017; 01/2018   a) EF 55-60%.  No RWMA,  Moderate LA dilation.  Mild LA dilation.  Peak PA pressures ~57 mmHg (moderate);;; b) EF 55-60%.  GRII DD.  Suggestion of RV volume overload with moderate RV dilation.  Severe LA and RA dilation.Marland Kitchen  PA pressure ~67%.  . TRANSTHORACIC ECHOCARDIOGRAM  01/2019   Normal LV size and function.  EF 50 to 55%.  Impaired relaxation.  RV appears to have moderately reduced function.  Severely enlarged.  Cannot fully assess RV pressures.  Hypermobile interatrial septum.  Right atrium severely dilated.  . TUBAL LIGATION    . vaginal cyst removed     several times     OB History   No obstetric history on file.     Family History  Problem Relation Age of Onset  . Allergies Father   . Heart disease Father        before 44  .  Heart failure Father   . Hypertension Father   . Hyperlipidemia Father   . Heart disease Mother   . Diabetes Mother   .  Hypertension Mother   . Hyperlipidemia Mother   . Hypertension Sister   . Heart disease Sister        before 2  . Hyperlipidemia Sister   . Diabetes Brother   . Hypertension Brother   . Diabetes Sister   . Hypertension Sister   . Diabetes Son   . Hypertension Son   . Cancer Other     Social History   Tobacco Use  . Smoking status: Former Smoker    Packs/day: 0.50    Years: 25.00    Pack years: 12.50    Types: Cigarettes  . Smokeless tobacco: Never Used  . Tobacco comment: quit smoking 20+ytrs ago  Substance Use Topics  . Alcohol use: No  . Drug use: No    Home Medications Prior to Admission medications   Medication Sig Start Date End Date Taking? Authorizing Provider  acetaminophen (TYLENOL) 500 MG tablet Take 500-1,000 mg by mouth every 4 (four) hours as needed for moderate pain.    Yes [provider]  albuterol (VENTOLIN HFA) 108 (90 Base) MCG/ACT inhaler Inhale 1-2 puffs into the lungs every 6 (six) hours as needed for wheezing or shortness of breath.   Yes [provider]  atorvastatin (LIPITOR) 10 MG tablet Take 10 mg by mouth every evening.   Yes [provider]  B Complex-C (B-COMPLEX WITH VITAMIN C) tablet Take 1 tablet by mouth daily.   Yes [provider]  carvedilol (COREG) 25 MG tablet Take 1 tablet (25 mg total) by mouth 2 (two) times daily with a meal. 12/29/18  Yes Leonie Man, MD  Cholecalciferol (VITAMIN D-3) 1000 units CAPS Take 1,000 Units by mouth daily.   Yes [provider]  diclofenac sodium (VOLTAREN) 1 % GEL Apply 2 g topically 4 (four) times daily as needed (pain). 05/17/18  Yes Vann, Jessica U, DO  furosemide (LASIX) 40 MG tablet TAKE ONE TABLET BY MOUTH SATURDAY AND SUNDAY Patient taking differently: Take 40 mg by mouth See admin instructions. Daily on Sat and Sun only 08/17/19   Yes Leonie Man, MD  furosemide (LASIX) 80 MG tablet TAKE ONE TABLET BY MOUTH DAILY  MONDAY THRU FRIDAY Patient taking differently: Take 80 mg by mouth See admin instructions. TAKE ONE TABLET BY MOUTH DAILY  MONDAY THRU FRIDAY 05/29/19  Yes Leonie Man, MD  hydrALAZINE (APRESOLINE) 100 MG tablet Take 1 tablet (100 mg total) by mouth 2 (two) times daily. 06/22/19  Yes Leonie Man, MD  hydrocortisone cream 1 % Apply 1 application topically daily as needed for itching.   Yes [provider]  insulin glargine, 1 Unit Dial, (TOUJEO) 300 UNIT/ML Solostar Pen Inject 36 Units into the skin at bedtime.    Yes [provider]  insulin lispro (HUMALOG KWIKPEN) 100 UNIT/ML KwikPen Inject 0.02-0.04 mLs (2-4 Units total) into the skin 3 (three) times daily as needed (high blood sugar over 150). 09/14/19  Yes [name], MD  isosorbide mononitrate (IMDUR) 60 MG 24 hr tablet Take 1 tablet by mouth once daily 11/03/19  Yes Leonie Man, MD  lamoTRIgine (LAMICTAL) 150 MG tablet Take 1 tablet (150 mg total) by mouth 2 (two) times daily. 09/11/19  Yes Cameron Sprang, MD  levETIRAcetam (KEPPRA) 500 MG tablet Take 1 tablet every night Patient taking differently: Take 500 mg by mouth 2 (two) times daily.  09/15/19  Yes Cameron Sprang, MD  Menthol, Topical Analgesic, (STOPAIN ROLL-ON EX) Apply 1 application topically  daily as needed (pain).   Yes [provider]  metolazone (ZAROXOLYN) 2.5 MG tablet Take 2.5 mg by mouth See admin instructions. Take 2.5 mg by mouth Monday through Friday as needed if weight is greater or equal to 269 lbs   Yes [provider]  Multiple Vitamins-Minerals (MULTIVITAMIN WITH MINERALS) tablet Take 1 tablet by mouth daily.   Yes [provider]  neomycin-bacitracin-polymyxin (NEOSPORIN) ointment Apply 1 application topically as needed for wound care.   Yes [provider]  OZEMPIC, 1 MG/DOSE, 2 MG/1.5ML SOPN INJECT 1MG INTO  THE SKIN ONCE A WEEK Patient taking differently: Inject 1 mg into the muscle once a week. Thursday 11/02/19  Yes [name], MD  Pancrelipase, Lip-Prot-Amyl, (ZENPEP) 25000-79000 units CPEP Take 1-2 capsules by mouth See admin instructions. Take 2 capsules in the morning, 1 capsule with lunch, and 2 capsules in the evening.   Yes [provider]  potassium chloride SA (KLOR-CON) 20 MEQ tablet Take 40 meq daily, when taking metolazone take a total of 60 meq daily Patient taking differently: Take 40-60 mEq by mouth See admin instructions. Take 40 meq daily, when taking metolazone take a total of 60 meq daily  10/20/19  Yes Leonie Man, MD  sertraline (ZOLOFT) 25 MG tablet Take 25 mg by mouth See admin instructions. Take with 50 mg to equal 75 mg at bedtime   Yes [provider]  sertraline (ZOLOFT) 50 MG tablet TAKE 1 TABLET EVERY DAY Patient taking differently: Take 50 mg by mouth See admin instructions. Take with 25 mg to equal 75 mg at bedtime 05/11/19  Yes [name], MD  tolnaftate (TINACTIN) 1 % cream Apply 1 application topically daily as needed (foot fungus).    Yes [provider]  Accu-Chek Softclix Lancets lancets Use as instructed to check blood sugars 2 times per day dx: e11.22 06/25/19   Minette Brine, FNP  Blood Glucose Calibration (ACCU-CHEK AVIVA) SOLN Use as directed dx: e11.22 02/17/19   [name], MD  Blood Glucose Monitoring Suppl (ACCU-CHEK AVIVA PLUS) w/Device KIT Use as directed to check blood sugars 2 times per day dx: e11.22 02/09/19   [name], MD  DROPLET PEN NEEDLES 31G X 8 MM MISC USE DAILY WITH [place].  06/25/19   [name], MD  glucose blood (ACCU-CHEK AVIVA PLUS) test strip Use as instructed to check blood sugars 2 times per day dx: e11.22 06/25/19   Minette Brine, FNP  Isopropyl Alcohol (ALCOHOL WIPES) 70 % MISC Apply 1 each topically 2 (two) times daily. 06/25/19   Minette Brine, FNP    Allergies      Other, Penicillins, Sulfa antibiotics, Aspirin, and Codeine  Review of Systems   Review of Systems  Constitutional: Negative for chills and fever.  Eyes: Negative for pain and visual disturbance.  Respiratory: Positive for shortness of breath. Negative for cough.   Cardiovascular: Negative for chest pain and palpitations.  Gastrointestinal: Negative for abdominal pain and vomiting.  Genitourinary: Negative for dysuria and hematuria.  Musculoskeletal: Negative for arthralgias and myalgias.  Skin: Negative for color change and rash.  Neurological: Positive for light-headedness. Negative for syncope and speech difficulty.  Psychiatric/Behavioral: Negative for agitation and confusion.  All other systems reviewed and are negative.   Physical Exam Updated Vital Signs BP (!) 146/64 (BP Location: Left Arm)   Pulse 68   Temp 98.2 F (36.8 C) (Oral)   Resp 13   SpO2 96%   Physical Exam Vitals  and nursing note reviewed.  Constitutional:      General: She is not in acute distress.    Appearance: She is well-developed.  HENT:     Head: Normocephalic and atraumatic.  Eyes:     Conjunctiva/sclera: Conjunctivae normal.  Cardiovascular:     Rate and Rhythm: Normal rate and regular rhythm.     Heart sounds: Murmur present.  Pulmonary:     Effort: Pulmonary effort is normal. No respiratory distress.     Breath sounds: Normal breath sounds.     Comments: 98% on room air Abdominal:     Palpations: Abdomen is soft.     Tenderness: There is no abdominal tenderness.  Musculoskeletal:     Cervical back: Neck supple.  Skin:    General: Skin is warm and dry.  Neurological:     General: No focal deficit present.     Mental Status: She is alert and oriented to person, place, and time.  Psychiatric:        Mood and Affect: Mood normal.        Behavior: Behavior normal.     ED Results / Procedures / Treatments   Labs (all labs ordered are listed, but only abnormal results are  displayed) Labs Reviewed  BASIC METABOLIC PANEL - Abnormal; Notable for the following components:      Result Value   Chloride 96 (*)    Glucose, Bld 195 (*)    Creatinine, Ser 1.05 (*)    GFR calc non Af Amer 53 (*)    All other components within normal limits  CBC - Abnormal; Notable for the following components:   Hemoglobin 11.3 (*)    MCV 75.6 (*)    MCH 22.8 (*)    RDW 16.7 (*)    All other components within normal limits  URINALYSIS, ROUTINE W REFLEX MICROSCOPIC - Abnormal; Notable for the following components:   Color, Urine STRAW (*)    APPearance HAZY (*)    All other components within normal limits  CBG MONITORING, ED - Abnormal; Notable for the following components:   Glucose-Capillary 156 (*)    All other components within normal limits  SARS CORONAVIRUS 2 (TAT 6-24 HRS)  BRAIN NATRIURETIC PEPTIDE  TSH  CBC  BASIC METABOLIC PANEL  CBC  HEMOGLOBIN A1C  CBG MONITORING, ED  TROPONIN I (HIGH SENSITIVITY)  TROPONIN I (HIGH SENSITIVITY)  TROPONIN I (HIGH SENSITIVITY)    EKG None  Radiology No results found.  Procedures Procedures (including critical care time)  Medications Ordered in ED Medications  sodium chloride flush (NS) 0.9 % injection 3 mL (has no administration in time range)  sodium chloride flush (NS) 0.9 % injection 3 mL (has no administration in time range)  heparin injection 5,000 Units (5,000 Units Subcutaneous Given 11/08/19 2136)  polyethylene glycol (MIRALAX / GLYCOLAX) packet 17 g (has no administration in time range)  acetaminophen (TYLENOL) tablet 650 mg (has no administration in time range)    Or  acetaminophen (TYLENOL) suppository 650 mg (has no administration in time range)  ondansetron (ZOFRAN) tablet 4 mg (has no administration in time range)    Or  ondansetron (ZOFRAN) injection 4 mg (has no administration in time range)  insulin aspart (novoLOG) injection 0-15 Units (has no administration in time range)  insulin aspart  (novoLOG) injection 0-5 Units (0 Units Subcutaneous Not Given 11/08/19 2133)  insulin aspart (novoLOG) injection 6 Units (has no administration in time range)  acetaminophen (TYLENOL) tablet 500-1,000 mg (has  no administration in time range)  atorvastatin (LIPITOR) tablet 10 mg (has no administration in time range)  carvedilol (COREG) tablet 25 mg (has no administration in time range)  isosorbide mononitrate (IMDUR) 24 hr tablet 60 mg (has no administration in time range)  hydrALAZINE (APRESOLINE) tablet 100 mg (has no administration in time range)  sertraline (ZOLOFT) tablet 75 mg (has no administration in time range)  Pancrelipase (Lip-Prot-Amyl) 25000-79000 units CPEP 1-2 capsule (has no administration in time range)  lamoTRIgine (LAMICTAL) tablet 150 mg (has no administration in time range)  levETIRAcetam (KEPPRA) tablet 500 mg (has no administration in time range)  torsemide (DEMADEX) tablet 40 mg (has no administration in time range)  insulin glargine (LANTUS) injection 36 Units (has no administration in time range)    ED Course  I have reviewed the triage vital signs and the nursing notes.  Pertinent labs & imaging results that were available during my care of the patient were reviewed by me and considered in my medical decision making (see chart for details).  74 yo female here with episode of near-syncope. Confirmed with sister who witnessed the event that there was no complete loss of consciousness.  Patient awake, well appearing, in no distress, and stable on arrival in the ED.  Very pleasant lady.  DDx includes ACS vs pulmonary HTN vs infection vs other  The history provided by her sister implies this has been a chronic, worsening condition (episodes of shortness of breath, usually self-resolving), but today was the most dramatic episode yet.    This may be related to her right ventricular dysfunction.  She is pending RHC tomorrow.  I spoke to our cardiologist who was consulted  and recommended medical admission, echo, and they will consider cath tomorrow.  Clinically I have a lower suspicion for PE, orthostatic hypotension (OS vitals normal here), ACS, anemia, or infection.      Clinical Course as of Nov 08 2310  Sun Nov 08, 2019  1327 Ecg per my interpretation shows NSR hr 64, Qtc 434, no STMEI   [MT]  1539 Unable to reach [employer] patient's PoA by phone.     [MT]  23 Reached pts son, who does not live with her, could not provide further history from today.  Unable to reach sandra pressley on 3rd phone call   [MT]  1722 Reached her sister by phone who updated me as noted in history of note now.  Plan to admit   [MT]  1752 Signed out to hospitalist, obs admit, cards consulting and following   [MT]    Clinical Course User Index [MT] Josalyn Dettmann, Carola Rhine, MD    Final Clinical Impression(s) / ED Diagnoses Final diagnoses:  Near syncope    Rx / DC Orders ED Discharge Orders    None       Wyvonnia Dusky, MD 11/08/19 2315

## 2019-11-08 NOTE — H&P (Addendum)
History and Physical    [name] LSL:[id number] DOB: Jul 26, 1945 DOA: 11/08/2019  PCP: [name], MD (Confirm with patient/family/NH records and if not entered, this has to be entered at [facility] point of entry) Patient coming from: Home   Chief Complaint: Syncope  HPI: [name] is a 74 y.o. female with  HTN, DM, COPD, OHS/OSA chronic HFpEF, epilepsy, pancreatic insufficiency who presented with syncope.  Patient reports she was about to take shower when suddenly lost all her energy.  Felt as if " I had a bag over my head".  Sister [name] found her leaning over the rail of the bed.    Sister proceeded to support her and while in her arms she passed out for approximately 5 minutes.  She was then lowered to the ground by the sister.  No reports of tonic-clonic activity.  No bowel or bladder incontinence reported.  Patient reports associated dyspnea, palpitations.   Similar to prior episodes for which she has had work-up including left heart cath, Holter and is planned for CardioMEMS placement in the near future.    No fever chills or cough. History of electric-like pains across the chest that last less than second without any alleviating or aggravating factors.  Sister reports extreme baseline dyspnea that appears NYHA class III .  TTE 02/2019 showed LVEF 50 to 55%, moderate to severe RV dilatation with mild to moderately decreased RV systolic function.  RHC/LHC (03/2019) showed normal coronary arteries, RA 10, PA 56/14/31, PCWP 13, LVEDP 18, PVR 2.44.   She underwent a VQ scan 07/2019 which showed no evidence for acute or chronic PE.  She follows with Dr. [name], planning cardiomems placement tomorrow.   ED Course:  Vitals with blood pressure 118/57, HR 55-64, RR 16, SPO2 90s on room air. -Labs with sodium 136, potassium 4.4, BUN 16, creatinine 1.05 WBC 4.4, Hb 11.3, platelets 276. -Troponin I was 8--> 7 -Urinalysis unremarkable  -EKG with NSR at 64, borderline RAD, RBBB  without significant ST-T wave changes.  QTc 434 ms.  Patient seen by cardiology.  Recommended TTE and observation on telemetry.  Review of Systems: As per HPI otherwise 10 point review of systems negative.  Review of Systems  Constitutional: Positive for malaise/fatigue. Negative for chills, diaphoresis, fever and weight loss.  HENT: Negative.   Eyes: Negative.   Respiratory: Positive for shortness of breath. Negative for cough, hemoptysis, sputum production and wheezing.   Cardiovascular: Positive for palpitations. Negative for chest pain, orthopnea, claudication, leg swelling and PND.  Gastrointestinal: Positive for nausea. Negative for abdominal pain, blood in stool, constipation, diarrhea, heartburn, melena and vomiting.  Genitourinary: Negative.   Musculoskeletal: Positive for back pain. Negative for falls, joint pain, myalgias and neck pain.  Skin: Negative for itching and rash.  Neurological: Negative.   Endo/Heme/Allergies: Negative.   Psychiatric/Behavioral: Negative.      Past Medical History:  Diagnosis Date  . Abnormal liver function     in the past.  . Anemia   . Arthritis    all over  . Bruises easily   . Cataract    right eye;immature  . Chronic back pain    stenosis  . Chronic cough   . Chronic kidney disease   . COPD (chronic obstructive pulmonary disease) ([place])   . Demand myocardial infarction [facility]) 2012   Demand Infarction in setting of Pancreatitis --> mild Troponin elevation, NON-OBSTRUCTIVE CAD  . Depression    takes Abilify daily as well as Zoloft  .  Diastolic heart failure 2202   Grade 1 diastolic Dysfunction by Echo   . Diverticulosis   . DM (diabetes mellitus) ([place])    takes Victoza daily as well as Lantus and Humalog  . Empty sella ([place])    on MRI in 2009.  . Glaucoma   . Headache(784.0)    last migraine-4-26yr ago  . History of blood transfusion    no abnormal reaction noted  . History of colon polyps    benign  . HTN (hypertension)      takes BKinder Morgan Energydaily  . Hyperlipidemia    takes Lipitor daily  . Joint swelling   . Nocturia   . Ob[place] hypoventilation syndrome ([place]   . Obstructive sleep apnea 02/2018   Notably improved split-night study with weight loss from 270 (2017) down to 250 pounds (2019).  . Pancreatitis    takes Pancrelipase daily  . Parkinson's disease ([place]    takes Sinemet daily  . Peripheral neuropathy   . Pneumonia 2012  . Seizures ([place]    takes Lamictal daily and Primidone nightly;last seizure 2wks ago  . Urinary urgency    With increased frequency  . Varicose veins of both lower extremities with pain    With edema.  Takes daily Lasix    Past Surgical History:  Procedure Laterality Date  . ABDOMINAL HYSTERECTOMY    . APPENDECTOMY    . BACK SURGERY    . Cardiac Event Monitor  September-October 2017   Sinus rhythm with occasional PACs and artifact. No arrhythmias besides one short run of tachycardia.  . CHOLECYSTECTOMY    . COLONOSCOPY N/A 08/15/2012   Procedure: COLONOSCOPY;  Surgeon: WArta Silence MD;  Location: [facility];  Service: Endoscopy;  Laterality: N/A;  . COLONOSCOPY WITH PROPOFOL Left 06/17/2017   Procedure: COLONOSCOPY WITH PROPOFOL;  Surgeon: KRonnette Juniper MD;  [place]  Service: Gastroenterology;  Laterality: Left;  . CORONARY CALCIUM SCORE AND CTA  04/2018   Coronary calcium score 71.9.  Very large, hyperdynamic LAD wrapping apex giving rise to PDA.  Moderate LAD-diagonal and circumflex plaque -> CT FFR suggested positive findings in distal D1, D2 and distal circumflex.  Referred for cath. ==> FALSE POSITIVE  . ESOPHAGOGASTRODUODENOSCOPY    . eye cysts Bilateral   . LASIK    . LEFT HEART CATH AND CORONARY ANGIOGRAPHY N/A 05/16/2018   Procedure: LEFT HEART CATH AND CORONARY ANGIOGRAPHY;  Surgeon: SBelva Crome MD;  Location: [facility] INVASIVE CV LAB:  Angiographically normal coronary arteries with LVEDP of 21 mmHg.  Large draping  hyperdominant LAD that wraps the apex and provides distal half of the PDA.  Relatively small caliber distal Cx -->proxLPDA, non-dom RCA. No Cx or Diag lesions -- FALSE + CT FFR  . LEFT HEART CATH AND CORONARY ANGIOGRAPHY  2009/March 2012   2009: (Dr. KAlvie Heidelberg Nonobstructive CAD; 2012: Minimal CAD --> false positive stress test  . NM MYOVIEW LTD  01/2016; 01/2018   a)LOW RISK. No ischemia or infarction;; b) EF 55-60%. NO ST changes.  No ischemia or Infarction. NO significant RV enlargement.  LOW RISK.  .Marland KitchenPolysomnogram  02/2018   (Dr. DBrett Fairyfrom neurology): Split study was not adequately done due to low AHI.  She slept reclined and had an AHI less than 10, prolonged hypoxemia and no hypercapnia. -->  She has home oxygen already prescribed, but did not meet criteria for nightly oxygen.  (Was noted that the patient did not cooperate well with study.  Titration was  discussed.  Marland Kitchen RECTAL POLYPECTOMY    . RIGHT/LEFT HEART CATH AND CORONARY ANGIOGRAPHY N/A 04/06/2019   Procedure: RIGHT/LEFT HEART CATH AND CORONARY ANGIOGRAPHY;  Surgeon: Leonie Man, MD;  [facility] INVASIVE CV LAB; angiographically normal coronary arteries.  Moderately elevated AD LVEDP..  Mild pulmonary hypertension: PA P 56/14 mmHg-mean 31 mmHg.  RAP 10 mmHg.  RV P-EDP 54/4 mmHg - 12 mmHg.  PCWP 11-15 mmHg.  CO-CI: 7.35-3.73.  . TONSILLECTOMY    . TRANSTHORACIC ECHOCARDIOGRAM  01/2016; 05/2017   A) Normal LV chamber size with mod LVH pattern. EF 50-55%. Severe LA dilation. Mod RA dilation. PAP elevated at 38 mmHg;; B) Mild concentric LVH.  EF 55-60% & no RWMA.  Grade 2 DD.  Diastolic flattening of the ventricular septum == ? elevated PAP.  Mild aortic valve calcification/sclerosis.  Mod LA dilation.  Mild RV dilation & Mod RA dilation  . TRANSTHORACIC ECHOCARDIOGRAM  10/2017; 01/2018   a) EF 55-60%.  No RWMA,  Moderate LA dilation.  Mild LA dilation.  Peak PA pressures ~57 mmHg (moderate);;; b) EF 55-60%.  GRII DD.  Suggestion of RV  volume overload with moderate RV dilation.  Severe LA and RA dilation.Marland Kitchen  PA pressure ~67%.  . TRANSTHORACIC ECHOCARDIOGRAM  01/2019   Normal LV size and function.  EF 50 to 55%.  Impaired relaxation.  RV appears to have moderately reduced function.  Severely enlarged.  Cannot fully assess RV pressures.  Hypermobile interatrial septum.  Right atrium severely dilated.  . TUBAL LIGATION    . vaginal cyst removed     several times     reports that she has quit smoking. Her smoking use included cigarettes. She has a 12.50 pack-year smoking history. She has never used smokeless tobacco. She reports that she does not drink alcohol or use drugs.  Allergies  Allergen Reactions  . Other Anaphylaxis and Rash    Bleach  . Penicillins Hives    Did it involve swelling of the face/tongue/throat, SOB, or low BP? No Did it involve sudden or severe rash/hives, skin peeling, or any reaction on the inside of your mouth or nose? Yes Did you need to seek medical attention at a hospital or doctor's office? Yes When did it last happen?Childhood allergy  If all above answers are "NO", may proceed with cephalosporin use.    . Sulfa Antibiotics Hives  . Aspirin Other (See Comments)     On aspirin 81 mg - Rectal bleeding in dec 2018  . Codeine     Headache and makes the patient feel "off"    Family History  Problem Relation Age of Onset  . Allergies Father   . Heart disease Father        before 45  . Heart failure Father   . Hypertension Father   . Hyperlipidemia Father   . Heart disease Mother   . Diabetes Mother   . Hypertension Mother   . Hyperlipidemia Mother   . Hypertension Sister   . Heart disease Sister        before 60  . Hyperlipidemia Sister   . Diabetes Brother   . Hypertension Brother   . Diabetes Sister   . Hypertension Sister   . Diabetes Son   . Hypertension Son   . Cancer Other      Prior to Admission medications   Medication Sig Start Date End Date Taking?  Authorizing Provider  Accu-Chek Softclix Lancets lancets Use as instructed to check blood sugars  2 times per day dx: e11.22 06/25/19   Minette Brine, FNP  acetaminophen (TYLENOL) 500 MG tablet Take 500-1,000 mg by mouth every 4 (four) hours as needed for moderate pain.     [provider]  albuterol (VENTOLIN HFA) 108 (90 Base) MCG/ACT inhaler Inhale 1-2 puffs into the lungs every 6 (six) hours as needed for wheezing or shortness of breath.    [provider]  atorvastatin (LIPITOR) 10 MG tablet Take 10 mg by mouth every evening.    [provider]  B Complex-C (B-COMPLEX WITH VITAMIN C) tablet Take 1 tablet by mouth daily.    [provider]  Blood Glucose Calibration (ACCU-CHEK AVIVA) SOLN Use as directed dx: e11.22 02/17/19   [name], MD  Blood Glucose Monitoring Suppl (ACCU-CHEK AVIVA PLUS) w/Device KIT Use as directed to check blood sugars 2 times per day dx: e11.22 02/09/19   [name], MD  carvedilol (COREG) 25 MG tablet Take 1 tablet (25 mg total) by mouth 2 (two) times daily with a meal. 12/29/18   Leonie Man, MD  Cholecalciferol (VITAMIN D-3) 1000 units CAPS Take 1,000 Units by mouth daily.    [provider]  diclofenac sodium (VOLTAREN) 1 % GEL Apply 2 g topically 4 (four) times daily as needed (pain). 05/17/18   Eulogio Bear U, DO  DROPLET PEN NEEDLES 31G X 8 MM MISC USE DAILY WITH VICTOZA AND/OR NOVOLOG.  06/25/19   [name], MD  furosemide (LASIX) 40 MG tablet TAKE ONE TABLET BY MOUTH SATURDAY AND SUNDAY Patient taking differently: Take 40 mg by mouth See admin instructions. Sat and Sun only 08/17/19   Leonie Man, MD  furosemide (LASIX) 80 MG tablet TAKE ONE TABLET BY MOUTH DAILY  MONDAY THRU FRIDAY Patient taking differently: Take 80 mg by mouth See admin instructions. TAKE ONE TABLET BY MOUTH DAILY  MONDAY THRU FRIDAY 05/29/19   Leonie Man, MD  glucose blood (ACCU-CHEK AVIVA PLUS) test strip Use as instructed  to check blood sugars 2 times per day dx: e11.22 06/25/19   Minette Brine, FNP  hydrALAZINE (APRESOLINE) 100 MG tablet Take 1 tablet (100 mg total) by mouth 2 (two) times daily. 06/22/19   Leonie Man, MD  hydrocortisone cream 1 % Apply 1 application topically daily as needed for itching.    [provider]  insulin glargine, 1 Unit Dial, (TOUJEO) 300 UNIT/ML Solostar Pen Inject 34 Units into the skin at bedtime.    [provider]  insulin lispro (HUMALOG KWIKPEN) 100 UNIT/ML KwikPen Inject 0.02-0.04 mLs (2-4 Units total) into the skin 3 (three) times daily as needed (high blood sugar over 150). 09/14/19   [name], MD  Isopropyl Alcohol (ALCOHOL WIPES) 70 % MISC Apply 1 each topically 2 (two) times daily. 06/25/19   Minette Brine, FNP  isosorbide mononitrate (IMDUR) 60 MG 24 hr tablet Take 1 tablet by mouth once daily 11/03/19   Leonie Man, MD  lamoTRIgine (LAMICTAL) 150 MG tablet Take 1 tablet (150 mg total) by mouth 2 (two) times daily. 09/11/19   Cameron Sprang, MD  levETIRAcetam (KEPPRA) 500 MG tablet Take 1 tablet every night Patient taking differently: Take 500 mg by mouth at bedtime. Take 1 tablet every night 09/15/19   Cameron Sprang, MD  Menthol, Topical Analgesic, (STOPAIN ROLL-ON EX) Apply 1 application topically daily as needed (pain).    [provider]  metolazone (ZAROXOLYN) 2.5 MG tablet Take 2.5 mg by mouth See  admin instructions. Take 2.5 mg by mouth Monday through Friday as needed if weight is greater or equal to 269 lbs    [provider]  Multiple Vitamins-Minerals (MULTIVITAMIN WITH MINERALS) tablet Take 1 tablet by mouth daily.    [provider]  neomycin-bacitracin-polymyxin (NEOSPORIN) ointment Apply 1 application topically as needed for wound care.    [provider]  OZEMPIC, 1 MG/DOSE, 2 MG/1.5ML SOPN INJECT 1MG INTO THE SKIN ONCE A WEEK 11/02/19   [name], MD  Pancrelipase, Lip-Prot-Amyl,  (ZENPEP) 25000-79000 units CPEP Take 1-2 capsules by mouth See admin instructions. Take 2 capsules in the morning, 1 capsule with lunch, and 2 capsules in the evening.    [provider]  potassium chloride SA (KLOR-CON) 20 MEQ tablet Take 40 meq daily, when taking metolazone take a total of 60 meq daily Patient taking differently: Take 40-60 mEq by mouth See admin instructions. Take 40 meq daily, when taking metolazone take a total of 60 meq daily  10/20/19   Leonie Man, MD  sertraline (ZOLOFT) 25 MG tablet Take 25 mg by mouth See admin instructions. Take with 50 mg to equal 75 mg at bedtime    [provider]  sertraline (ZOLOFT) 50 MG tablet TAKE 1 TABLET EVERY DAY Patient taking differently: Take 50 mg by mouth See admin instructions. Take with 25 mg to equal 75 mg at bedtime 05/11/19   [name], MD  tolnaftate (TINACTIN) 1 % cream Apply 1 application topically daily as needed (foot fungus).     [provider]    Physical Exam: Vitals:   11/08/19 1155 11/08/19 1200 11/08/19 1215  BP: (!) 118/57 123/71 (!) 145/67  Pulse: 64 (!) 55 65  Resp: 16 20 16   Temp: 98.3 F (36.8 C)    TempSrc: Oral    SpO2: 99% 92% 96%    Constitutional: NAD, calm, comfortable Vitals:   11/08/19 1155 11/08/19 1200 11/08/19 1215  BP: (!) 118/57 123/71 (!) 145/67  Pulse: 64 (!) 55 65  Resp: 16 20 16   Temp: 98.3 F (36.8 C)    TempSrc: Oral    SpO2: 99% 92% 96%   Physical Exam  Constitutional: She is oriented to person, place, and time. She appears well-developed and well-nourished. No distress.  HENT:  Head: Normocephalic and atraumatic.  Right Ear: External ear normal.  Left Ear: External ear normal.  Nose: Nose normal.  Mouth/Throat: Oropharynx is clear and moist. No oropharyngeal exudate.  Eyes: Pupils are equal, round, and reactive to light. EOM are normal. Right eye exhibits no discharge. Left eye exhibits no discharge. No scleral icterus.  Neck:  JVP at  8-9 at 45 degrees.   Cardiovascular: Normal rate and regular rhythm. Exam reveals no friction rub.  Murmur heard. Pulmonary/Chest: Effort normal. No respiratory distress. She has no wheezes. She has rales. She exhibits no tenderness.  Abdominal: Soft. She exhibits no distension and no mass. There is no abdominal tenderness. There is no rebound and no guarding.  Musculoskeletal:        General: No tenderness, deformity or edema. Normal range of motion.     Cervical back: Normal range of motion and neck supple.     Comments: Varicose veins present.  Also present are telangiectasias.  Neurological: She is alert and oriented to person, place, and time. She displays normal reflexes. No cranial nerve deficit. Coordination normal.  Skin: Skin is warm and dry. No rash noted. She is not diaphoretic. No erythema. No  pallor.     Labs on Admission: I have personally reviewed following labs and imaging studies  CBC: Recent Labs  Lab 11/06/19 1142 11/08/19 1306  WBC 4.9 4.4  HGB 10.9* 11.3*  HCT 34.9* 37.4  MCV 74.3* 75.6*  PLT 292 785   Basic Metabolic Panel: Recent Labs  Lab 11/06/19 1142 11/08/19 1306  NA 136 136  K 3.8 4.4  CL 99 96*  CO2 27 28  GLUCOSE 117* 195*  BUN 22 16  CREATININE 1.16* 1.05*  CALCIUM 9.4 9.4   GFR: CrCl cannot be calculated (Unknown ideal weight.). Liver Function Tests: No results for input(s): AST, ALT, ALKPHOS, BILITOT, PROT, ALBUMIN in the last 168 hours. No results for input(s): LIPASE, AMYLASE in the last 168 hours. No results for input(s): AMMONIA in the last 168 hours. Coagulation Profile: Recent Labs  Lab 11/06/19 1142  INR 1.1   Cardiac Enzymes: No results for input(s): CKTOTAL, CKMB, CKMBINDEX, TROPONINI in the last 168 hours. BNP (last 3 results) No results for input(s): PROBNP in the last 8760 hours. HbA1C: No results for input(s): HGBA1C in the last 72 hours. CBG: No results for input(s): GLUCAP in the last 168 hours. Lipid  Profile: No results for input(s): CHOL, HDL, LDLCALC, TRIG, CHOLHDL, LDLDIRECT in the last 72 hours. Thyroid Function Tests: No results for input(s): TSH, T4TOTAL, FREET4, T3FREE, THYROIDAB in the last 72 hours. Anemia Panel: No results for input(s): VITAMINB12, FOLATE, FERRITIN, TIBC, IRON, RETICCTPCT in the last 72 hours. Urine analysis:    Component Value Date/Time   COLORURINE STRAW (A) 11/08/2019 1340   APPEARANCEUR HAZY (A) 11/08/2019 1340   LABSPEC 1.005 11/08/2019 1340   PHURINE 7.0 11/08/2019 1340   GLUCOSEU NEGATIVE 11/08/2019 1340   GLUCOSEU NEGATIVE 04/17/2016 0955   HGBUR NEGATIVE 11/08/2019 1340   BILIRUBINUR NEGATIVE 11/08/2019 1340   BILIRUBINUR negative 10/22/2018 [place] 11/08/2019 1340   PROTEINUR NEGATIVE 11/08/2019 1340   UROBILINOGEN 0.2 10/22/2018 1243   UROBILINOGEN 0.2 04/17/2016 0955   NITRITE NEGATIVE 11/08/2019 1340   LEUKOCYTESUR NEGATIVE 11/08/2019 1340    Radiological Exams on Admission: No results found.  EKG: Independently reviewed.  NSR at 64, borderline right axis deviation.  RBBB without any significant ST-T wave changes.  QTc 434 ms.  Holter monitor, 07/2019 Narrative & Impression    Predominant rhythm sinus with rates ranging from 52 to 92 bpm. Average 65 bpm.  Rare, <1% PACs or PVCs. No couplets or triplets noted, and very rare ventricular bigeminy noted.  Total of 20 short bursts of PSVT/PAT ranging from 5-10 beats in heart rate from 103 to 132 bpm.  Most of patient's symptoms were noted with sinus rhythm, or sinus rhythm with rare PACs/PVCs. Symptoms not noted during episodes of PSVT or PAT.  No atrial fibrillation, atrial flutter or prolonged PSVT/PAT noted.  No bradycardia events noted. Sinus bradycardia noted during sleeping hours.  04/06/2019 RHC/LHC Normal coronaries  RA 12, PASP 56 PA mean 31 LVEDP 19  02/25/2019 TTE: 1. The left ventricle has low normal systolic function, with an ejection fraction  of 50-55%. The cavity size was normal. Left ventricular diastolic Doppler parameters are consistent with impaired relaxation. No evidence of left ventricular regional wall motion abnormalities. 2. Although TAPSE is measured as normal, the right ventricle visually appears to have moderate to severely reduced systolic function. The cavity was severely enlarged. There is no increase in right ventricular wall thickness. Right ventricular systolic pressure could not be assessed. 3. Left  atrial size was mildly dilated. 4. Hypermobile interatrial septum. 5. Right atrial size was severely dilated. 6. The aorta is normal unless otherwise noted. 7. The inferior vena cava was dilated in size with <50% respiratory variability.   Assessment/Plan Active Problems:   * No active hospital problems. *    #Syncope: Unclear cause.   -Hard to classify as cardiac with normal troponin and unremarkable EKG. -Holter 08/03/2019 with 5-10 beat runs of SVT. -LHC 04/06/2019 without obstructive CAD.  Holter 2/21 was unremarkable. -In the past, episodes have been attributed to RV dysfunction which is likely from [facility] group 2 and group 3 pulmonary hypertension. -No report of tonic-clonic activity to suggest a seizure.  -Patient admitted as observation. -We will repeat TTE -Monitor on telemetry -Would plan to proceed with CardioMEMS placement tomorrow, will let Dr [name] know patient is admitted.   N.p.o. past midnight tentatively. -If work-up remains unremarkable, can consider ILR placement.  #Chronic diastolic CHF/RV failure:  -Likely from left heart failure + OHS/OSA.  V/Q scan in 2/21 did not show acute or chronic PE.    - Continue Coreg, and Imdur.  -May consider increasing hydralazine to 100 mg 3 times daily as that will help with RV dysfunction as well.  Sildenafil also remains another option albeit, not with Imdur. -We will consider changing Lasix 80 mg to torsemide 40 mg daily since wedge pressure was 19  on last cath. - Patient already set up for Cardiomems pressure sensor placement 11/09/19.  Consultant cardiologist will speak to Dr. [name].  # OHS/OSA: -Continue with inpatient CPAP  #DM -Lantus with sliding scale coverage. -No need for Ozempic while inpatient.  #Epilepsy -Contin Keppra Lamictal and Keppra.    DVT prophylaxis: Subcutaneous heparin.    Code Status: Full code Family Communication: Spoke with sister Marlowe Alt at [phone].  Disposition Plan: Likely home with PT.    Consults called: Cardiology. Admission status: Observation.   Patina Spanier MD [facility]   If 7PM-7AM, please contact night-coverage [email or web address] Password [email or web address]  11/08/2019, 5:31 PM

## 2019-11-08 NOTE — Consult Note (Signed)
Cardiology Consultation:   Patient ID: [name] MRN: [id number]; DOB: 03-20-1946  Admit date: 11/08/2019 Date of Consult: 11/08/2019  Primary Care Provider: [name], MD Primary Cardiologist: [name] Hew, MD  Primary Electrophysiologist:  None    Patient Profile:   [name] is a 74 y.o. female with a hx of OHS/OSA, CVA, seizure disorder, type 2 diabetes, hypertension, hyperlipidemia, chronic diastolic heart failure, pulmonary hypertension who is being seen today for the evaluation of syncope at the request of [name].  History of Present Illness:   [name] is a 74 year old female with the above medical history who presents with syncope.  She reports that she felt very fatigued all morning.  She thought she might be hypoglycemic, but when she checked her blood glucose it was 87.  She ate this morning with no improvement in her symptoms.  She states she was getting ready to take a bath and was leaning over her bed when she started feeling like she was going to pass out.  States it felt like heart was racing.  Her sister came in and helped her to the ground.  States she thinks she was unconscious for a few minutes.  Reported sharp left-sided chest pain that lasted a few seconds prior to the episode.   TTE 02/2019 showed LVEF 50 to 55%, moderate to severe RV dilatation with mild to moderately decreased RV systolic function.  RHC/LHC (03/2019) showed normal coronary arteries, RA 10, PA 56/14/31, PCWP 13, LVEDP 18, PVR 2.44.  She underwent a VQ scan 07/2019 which showed no evidence for acute or chronic PE.  She follows with [name]. Aundra [name], planning cardiomems placement tomorrow.  In the ED, vital signs were BP 118/57, pulse 64, SPO2 99% on room air, afebrile.  Labs notable for creatinine 1.05, hemoglobin 11.3, platelets 276, WBCs 4.4, Covid negative on 5/14.  EKG shows sinus rhythm, rate 64, no ST changes  Past Medical History:  Diagnosis Date  . Abnormal liver function    in the past.  . Anemia   . Arthritis    all over  . Bruises easily   . Cataract    right eye;immature  . Chronic back pain    stenosis  . Chronic cough   . Chronic kidney disease   . COPD (chronic obstructive pulmonary disease) ([place])   . Demand myocardial infarction [facility]) 2012   Demand Infarction in setting of Pancreatitis --> mild Troponin elevation, NON-OBSTRUCTIVE CAD  . Depression    takes Abilify daily as well as Zoloft  . Diastolic heart failure [id number]   Grade 1 diastolic Dysfunction by Echo   . Diverticulosis   . DM (diabetes mellitus) ([place])    takes Victoza daily as well as Lantus and Humalog  . Empty sella ([place])    on MRI in 2009.  . Glaucoma   . Headache(784.0)    last migraine-4-19yrs ago  . History of blood transfusion    no abnormal reaction noted  . History of colon polyps    benign  . HTN (hypertension)    takes [employer] daily  . Hyperlipidemia    takes Lipitor daily  . Joint swelling   . Nocturia   . Obesity hypoventilation syndrome ([place])   . Obstructive sleep apnea 02/2018   Notably improved split-night study with weight loss from 270 (2017) down to 250 pounds (2019).  . Pancreatitis    takes Pancrelipase daily  . Parkinson's disease ([place])    takes Sinemet daily  .  Peripheral neuropathy   . Pneumonia 2012  . Seizures ([place])    takes Lamictal daily and Primidone nightly;last seizure 2wks ago  . Urinary urgency    With increased frequency  . Varicose veins of both lower extremities with pain    With edema.  Takes daily Lasix    Past Surgical History:  Procedure Laterality Date  . ABDOMINAL HYSTERECTOMY    . APPENDECTOMY    . BACK SURGERY    . Cardiac Event Monitor  September-October 2017   Sinus rhythm with occasional PACs and artifact. No arrhythmias besides one short run of tachycardia.  . CHOLECYSTECTOMY    . COLONOSCOPY N/A 08/15/2012   Procedure: COLONOSCOPY;  Surgeon: [name] Silence, MD;  Location: [facility];  Service:  Endoscopy;  Laterality: N/A;  . COLONOSCOPY WITH PROPOFOL Left 06/17/2017   Procedure: COLONOSCOPY WITH PROPOFOL;  Surgeon: [name] Juniper, MD;  Location: [place];  Service: Gastroenterology;  Laterality: Left;  . CORONARY CALCIUM SCORE AND CTA  04/2018   Coronary calcium score 71.9.  Very large, hyperdynamic LAD wrapping apex giving rise to PDA.  Moderate LAD-diagonal and circumflex plaque -> CT FFR suggested positive findings in distal D1, D2 and distal circumflex.  Referred for cath. ==> FALSE POSITIVE  . ESOPHAGOGASTRODUODENOSCOPY    . eye cysts Bilateral   . LASIK    . LEFT HEART CATH AND CORONARY ANGIOGRAPHY N/A 05/16/2018   Procedure: LEFT HEART CATH AND CORONARY ANGIOGRAPHY;  Surgeon: [name] Crome, MD;  Location: [facility] INVASIVE CV LAB:  Angiographically normal coronary arteries with LVEDP of 21 mmHg.  Large draping hyperdominant LAD that wraps the apex and provides distal half of the PDA.  Relatively small caliber distal Cx -->proxLPDA, non-dom RCA. No Cx or Diag lesions -- FALSE + CT FFR  . LEFT HEART CATH AND CORONARY ANGIOGRAPHY  2009/March 2012   2009: ([name]. Alvie [name]) Nonobstructive CAD; 2012: Minimal CAD --> false positive stress test  . NM MYOVIEW LTD  01/2016; 01/2018   a)LOW RISK. No ischemia or infarction;; b) EF 55-60%. NO ST changes.  No ischemia or Infarction. NO significant RV enlargement.  LOW RISK.  Marland Kitchen Polysomnogram  02/2018   ([name] from neurology): Split study was not adequately done due to low AHI.  She slept reclined and had an AHI less than 10, prolonged hypoxemia and no hypercapnia. -->  She has home oxygen already prescribed, but did not meet criteria for nightly oxygen.  (Was noted that the patient did not cooperate well with study.  Titration was discussed.  Marland Kitchen RECTAL POLYPECTOMY    . RIGHT/LEFT HEART CATH AND CORONARY ANGIOGRAPHY N/A 04/06/2019   Procedure: RIGHT/LEFT HEART CATH AND CORONARY ANGIOGRAPHY;  Surgeon: [name] Man, MD;  Location: [facility] INVASIVE CV  LAB; angiographically normal coronary arteries.  Moderately elevated AD LVEDP..  Mild pulmonary hypertension: PA P 56/14 mmHg-mean 31 mmHg.  RAP 10 mmHg.  RV P-EDP 54/4 mmHg - 12 mmHg.  PCWP 11-15 mmHg.  CO-CI: 7.35-3.73.  . TONSILLECTOMY    . TRANSTHORACIC ECHOCARDIOGRAM  01/2016; 05/2017   A) Normal LV chamber size with mod LVH pattern. EF 50-55%. Severe LA dilation. Mod RA dilation. PAP elevated at 38 mmHg;; B) Mild concentric LVH.  EF 55-60% & no RWMA.  Grade 2 DD.  Diastolic flattening of the ventricular septum == ? elevated PAP.  Mild aortic valve calcification/sclerosis.  Mod LA dilation.  Mild RV dilation & Mod RA dilation  . TRANSTHORACIC ECHOCARDIOGRAM  10/2017; 01/2018   a) EF  55-60%.  No RWMA,  Moderate LA dilation.  Mild LA dilation.  Peak PA pressures ~57 mmHg (moderate);;; b) EF 55-60%.  GRII DD.  Suggestion of RV volume overload with moderate RV dilation.  Severe LA and RA dilation.Marland Kitchen  PA pressure ~67%.  . TRANSTHORACIC ECHOCARDIOGRAM  01/2019   Normal LV size and function.  EF 50 to 55%.  Impaired relaxation.  RV appears to have moderately reduced function.  Severely enlarged.  Cannot fully assess RV pressures.  Hypermobile interatrial septum.  Right atrium severely dilated.  . TUBAL LIGATION    . vaginal cyst removed     several times       Inpatient Medications: Scheduled Meds: . sodium chloride flush  3 mL Intravenous Once   Continuous Infusions:  PRN Meds:   Allergies:    Allergies  Allergen Reactions  . Other Anaphylaxis and Rash    [place]  . Penicillins Hives    Did it involve swelling of the face/tongue/throat, SOB, or low BP? No Did it involve sudden or severe rash/hives, skin peeling, or any reaction on the inside of your mouth or nose? Yes Did you need to seek medical attention at a hospital or doctor's office? Yes When did it last happen?Childhood allergy  If all above answers are "NO", may proceed with cephalosporin use.    . Sulfa Antibiotics  Hives  . Aspirin Other (See Comments)     On aspirin 81 mg - Rectal bleeding in dec 2018  . Codeine     Headache and makes the patient feel "off"    Social History:   Social History   Socioeconomic History  . Marital status: Divorced    Spouse name: Not on file  . Number of children: Not on file  . Years of education: Not on file  . Highest education level: Not on file  Occupational History  . Occupation: Disabled    Comment: CNA  Tobacco Use  . Smoking status: Former Smoker    Packs/day: 0.50    Years: 25.00    Pack years: 12.50    Types: Cigarettes  . Smokeless tobacco: Never Used  . Tobacco comment: quit smoking 20+ytrs ago  Substance and Sexual Activity  . Alcohol use: No  . Drug use: No  . Sexual activity: Not Currently    Birth control/protection: Surgical  Other Topics Concern  . Not on file  Social History Narrative   She lives in [place]. She has lots of family in the area and is accompanied by her sister.   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   She has a distant history of smoking, quit 20 years ago.   Social Determinants of Health   Financial Resource Strain:   . Difficulty of Paying Living Expenses:   Food Insecurity:   . Worried About Charity fundraiser in the Last Year:   . Arboriculturist in the Last Year:   Transportation Needs:   . Film/video editor (Medical):   Marland Kitchen Lack of Transportation (Non-Medical):   Physical Activity:   . Days of Exercise per Week:   . Minutes of Exercise per Session:   Stress:   . Feeling of Stress :   Social Connections:   . Frequency of Communication with Friends and Family:   . Frequency of Social Gatherings with Friends and Family:   . Attends Religious Services:   . Active Member of Clubs or Organizations:   .  Attends Archivist Meetings:   Marland Kitchen Marital Status:   Intimate Partner Violence:   . Fear of Current or Ex-Partner:   . Emotionally Abused:   Marland Kitchen  Physically Abused:   . Sexually Abused:     Family History:    Family History  Problem Relation Age of Onset  . Allergies Father   . Heart disease Father        before 39  . Heart failure Father   . Hypertension Father   . Hyperlipidemia Father   . Heart disease Mother   . Diabetes Mother   . Hypertension Mother   . Hyperlipidemia Mother   . Hypertension Sister   . Heart disease Sister        before 20  . Hyperlipidemia Sister   . Diabetes Brother   . Hypertension Brother   . Diabetes Sister   . Hypertension Sister   . Diabetes Son   . Hypertension Son   . Cancer Other      ROS:  Please see the history of present illness.   All other ROS reviewed and negative.     Physical Exam/Data:   Vitals:   11/08/19 1155 11/08/19 1200 11/08/19 1215  BP: (!) 118/57 123/71 (!) 145/67  Pulse: 64 (!) 55 65  Resp: 16 20 16   Temp: 98.3 F (36.8 C)    TempSrc: Oral    SpO2: 99% 92% 96%   No intake or output data in the 24 hours ending 11/08/19 1435 Last 3 Weights 09/28/2019 09/11/2019 09/08/2019  Weight (lbs) (No Data) 220 lb 9.6 oz 221 lb 9.6 oz  Weight (kg) (No Data) 100.064 kg 100.517 kg     There is no height or weight on file to calculate BMI.  General:   in no acute distress HEENT: normal Neck: + JVD Cardiac:  RRR; no murmur  Lungs:  clear to auscultation bilaterally, no wheezing, rhonchi or rales  Abd: soft, nontender, no hepatomegaly  Ext: no edema Musculoskeletal:  No deformities, BUE and BLE strength normal and equal Skin: warm and dry  Neuro:  CNs 2-12 intact, no focal abnormalities noted Psych:  Normal affect   EKG:  The EKG was personally reviewed and demonstrates:  sinus rhythm, rate 64, no ST changes Telemetry:  Telemetry was personally reviewed and demonstrates: sinus bradycardia, rate 50-60s  Relevant CV Studies: Cardiac monitor 02/27/09:  Predominant rhythm sinus with rates ranging from 52 to 92 bpm. Average 65 bpm.  Rare, <1% PACs or PVCs. No  couplets or triplets noted, and very rare ventricular bigeminy noted.  Total of 20 short bursts of PSVT/PAT ranging from 5-10 beats in heart rate from 103 to 132 bpm.  Most of patient's symptoms were noted with sinus rhythm, or sinus rhythm with rare PACs/PVCs. Symptoms not noted during episodes of PSVT or PAT.  No atrial fibrillation, atrial flutter or prolonged PSVT/PAT noted.  No bradycardia events noted. Sinus bradycardia noted during sleeping hours.   Overall, relatively reassuring study.  There were several short little bursts of fast heart rates, but not noted on symptoms.  Probably not long enough to make patient feel lightheaded, dizzy or short of breath.  Would only be felt as a quick little bursts of fluttering.  Would not be inclined to treat.  RHC/RHC 04/06/19:  Hemodynamic findings consistent with mild pulmonary hypertension -with moderately elevated LVEDP  There is no aortic valve stenosis.  Angiographically normal coronary arteries with LEFT-LAD dominant system   SUMMARY  Angiographically  normal coronary arteries with essentially LAD dominant system.  Right Heart Cath pressures show mild-moderate pulmonary hypertension with elevated LVEDP suggesting combination of diastolic dysfunction related as well as obesity hypoventilation related pulmonary hypertension  Normal/hyperdynamic cardiac output and index.   RECOMMENDATIONS  Discharge home after bedrest.  For now continue current medications as she seems to be adequately diuresed.  We will need to avoid overdiuresis in the future. -->  Shorter courses of increased Lasix for worsening edema or dyspnea.  Will review with advanced heart failure team.  TTE 02/25/19: 1. The left ventricle has low normal systolic function, with an ejection  fraction of 50-55%. The cavity size was normal. Left ventricular diastolic  Doppler parameters are consistent with impaired relaxation. No evidence of  left ventricular  regional wall  motion abnormalities.  2. Although TAPSE is measured as normal, the right ventricle visually  appears to have moderate to severely reduced systolic function. The cavity  was severely enlarged. There is no increase in right ventricular wall  thickness. Right ventricular systolic  pressure could not be assessed.  3. Left atrial size was mildly dilated.  4. Hypermobile interatrial septum.  5. Right atrial size was severely dilated.  6. The aorta is normal unless otherwise noted.  7. The inferior vena cava was dilated in size with <50% respiratory  variability.   Laboratory Data:  High Sensitivity Troponin:   Recent Labs  Lab 11/08/19 1306  TROPONINIHS 8     Chemistry Recent Labs  Lab 11/06/19 1142 11/08/19 1306  NA 136 136  K 3.8 4.4  CL 99 96*  CO2 27 28  GLUCOSE 117* 195*  BUN 22 16  CREATININE 1.16* 1.05*  CALCIUM 9.4 9.4  GFRNONAA 47* 53*  GFRAA 55* >60  ANIONGAP 10 12    No results for input(s): PROT, ALBUMIN, AST, ALT, ALKPHOS, BILITOT in the last 168 hours. Hematology Recent Labs  Lab 11/06/19 1142 11/08/19 1306  WBC 4.9 4.4  RBC 4.70 4.95  HGB 10.9* 11.3*  HCT 34.9* 37.4  MCV 74.3* 75.6*  MCH 23.2* 22.8*  MCHC 31.2 30.2  RDW 16.5* 16.7*  PLT 292 276   BNPNo results for input(s): BNP, PROBNP in the last 168 hours.  DDimer No results for input(s): DDIMER in the last 168 hours.   Radiology/Studies:  No results found. {   Assessment and Plan:   Syncope: Unclear cause.  Could be vasovagal as reported prodromal symptoms, but described feeling like heart was racing prior to episode which is suggestive of arrhythmia.  No position change to suggest orthostasis.  Has known pulmonary hypertension, was planning CardioMEMS placement tomorrow -Check TTE -Monitor on telemetry -Would plan to proceed with CardioMEMS placement tomorrow, will let [name] know patient is admitted.  Would make n.p.o. at midnight  HFpEF: Continue PO lasix  80 mg daily.  JVD on exam.  Mild-moderate RV dysfunction, likely due to HFpEF and OHS/OSA.  No PE on VQ scan  Pulmonary hypertension: RHC 03/2019 showed mild to moderate pulmonary venous hypertension.  Planned for CardioMEMS placement tomorrow   For questions or updates, please contact [place] Please consult [email or web address] for contact info under     Signed, Donato Heinz, MD  11/08/2019 2:35 PM

## 2019-11-09 ENCOUNTER — Observation Stay (HOSPITAL_COMMUNITY): Payer: Medicare HMO

## 2019-11-09 ENCOUNTER — Encounter (HOSPITAL_COMMUNITY)
Admission: EM | Disposition: A | Discharge status: Home / Self Care | Payer: Self-pay | Source: Home / Self Care | Attending: Internal Medicine

## 2019-11-09 ENCOUNTER — Ambulatory Visit (HOSPITAL_COMMUNITY): Admission: RE | Admit: 2019-11-09 | Payer: Medicare HMO | Source: Home / Self Care | Admitting: Cardiology

## 2019-11-09 ENCOUNTER — Ambulatory Visit: Payer: Self-pay

## 2019-11-09 ENCOUNTER — Inpatient Hospital Stay (HOSPITAL_COMMUNITY): Payer: Medicare HMO

## 2019-11-09 DIAGNOSIS — N179 Acute kidney failure, unspecified: Secondary | ICD-10-CM | POA: Diagnosis present

## 2019-11-09 DIAGNOSIS — E1142 Type 2 diabetes mellitus with diabetic polyneuropathy: Secondary | ICD-10-CM | POA: Diagnosis present

## 2019-11-09 DIAGNOSIS — Z885 Allergy status to narcotic agent status: Secondary | ICD-10-CM | POA: Diagnosis not present

## 2019-11-09 DIAGNOSIS — E662 Morbid (severe) obesity with alveolar hypoventilation: Secondary | ICD-10-CM | POA: Diagnosis present

## 2019-11-09 DIAGNOSIS — G2111 Neuroleptic induced parkinsonism: Secondary | ICD-10-CM

## 2019-11-09 DIAGNOSIS — I5082 Biventricular heart failure: Secondary | ICD-10-CM | POA: Diagnosis present

## 2019-11-09 DIAGNOSIS — J449 Chronic obstructive pulmonary disease, unspecified: Secondary | ICD-10-CM | POA: Diagnosis present

## 2019-11-09 DIAGNOSIS — R55 Syncope and collapse: Secondary | ICD-10-CM

## 2019-11-09 DIAGNOSIS — H409 Unspecified glaucoma: Secondary | ICD-10-CM | POA: Diagnosis present

## 2019-11-09 DIAGNOSIS — Z6841 Body Mass Index (BMI) 40.0 and over, adult: Secondary | ICD-10-CM

## 2019-11-09 DIAGNOSIS — G40909 Epilepsy, unspecified, not intractable, without status epilepticus: Secondary | ICD-10-CM | POA: Diagnosis present

## 2019-11-09 DIAGNOSIS — E1122 Type 2 diabetes mellitus with diabetic chronic kidney disease: Secondary | ICD-10-CM

## 2019-11-09 DIAGNOSIS — I471 Supraventricular tachycardia: Secondary | ICD-10-CM | POA: Diagnosis present

## 2019-11-09 DIAGNOSIS — E785 Hyperlipidemia, unspecified: Secondary | ICD-10-CM | POA: Diagnosis present

## 2019-11-09 DIAGNOSIS — I110 Hypertensive heart disease with heart failure: Secondary | ICD-10-CM | POA: Diagnosis not present

## 2019-11-09 DIAGNOSIS — I5032 Chronic diastolic (congestive) heart failure: Secondary | ICD-10-CM

## 2019-11-09 DIAGNOSIS — I272 Pulmonary hypertension, unspecified: Secondary | ICD-10-CM | POA: Diagnosis not present

## 2019-11-09 DIAGNOSIS — Z882 Allergy status to sulfonamides status: Secondary | ICD-10-CM | POA: Diagnosis not present

## 2019-11-09 DIAGNOSIS — Z794 Long term (current) use of insulin: Secondary | ICD-10-CM | POA: Diagnosis not present

## 2019-11-09 DIAGNOSIS — E1136 Type 2 diabetes mellitus with diabetic cataract: Secondary | ICD-10-CM | POA: Diagnosis present

## 2019-11-09 DIAGNOSIS — Z20822 Contact with and (suspected) exposure to covid-19: Secondary | ICD-10-CM | POA: Diagnosis present

## 2019-11-09 DIAGNOSIS — J9611 Chronic respiratory failure with hypoxia: Secondary | ICD-10-CM | POA: Diagnosis present

## 2019-11-09 DIAGNOSIS — K861 Other chronic pancreatitis: Secondary | ICD-10-CM | POA: Diagnosis not present

## 2019-11-09 DIAGNOSIS — I130 Hypertensive heart and chronic kidney disease with heart failure and stage 1 through stage 4 chronic kidney disease, or unspecified chronic kidney disease: Secondary | ICD-10-CM | POA: Diagnosis present

## 2019-11-09 DIAGNOSIS — Z87891 Personal history of nicotine dependence: Secondary | ICD-10-CM | POA: Diagnosis not present

## 2019-11-09 DIAGNOSIS — I509 Heart failure, unspecified: Secondary | ICD-10-CM | POA: Diagnosis not present

## 2019-11-09 DIAGNOSIS — Z886 Allergy status to analgesic agent status: Secondary | ICD-10-CM | POA: Diagnosis not present

## 2019-11-09 DIAGNOSIS — E1165 Type 2 diabetes mellitus with hyperglycemia: Secondary | ICD-10-CM

## 2019-11-09 DIAGNOSIS — Z79899 Other long term (current) drug therapy: Secondary | ICD-10-CM | POA: Diagnosis not present

## 2019-11-09 DIAGNOSIS — F329 Major depressive disorder, single episode, unspecified: Secondary | ICD-10-CM | POA: Diagnosis present

## 2019-11-09 HISTORY — PX: PRESSURE SENSOR/CARDIOMEMS: CATH118258

## 2019-11-09 LAB — POCT I-STAT EG7
Acid-Base Excess: 1 mmol/L (ref 0.0–2.0)
Acid-Base Excess: 5 mmol/L — ABNORMAL HIGH (ref 0.0–2.0)
Acid-Base Excess: 6 mmol/L — ABNORMAL HIGH (ref 0.0–2.0)
Acid-Base Excess: 6 mmol/L — ABNORMAL HIGH (ref 0.0–2.0)
Bicarbonate: 27.3 mmol/L (ref 20.0–28.0)
Bicarbonate: 30.8 mmol/L — ABNORMAL HIGH (ref 20.0–28.0)
Bicarbonate: 32.5 mmol/L — ABNORMAL HIGH (ref 20.0–28.0)
Bicarbonate: 32.9 mmol/L — ABNORMAL HIGH (ref 20.0–28.0)
Calcium, Ion: 0.94 mmol/L — ABNORMAL LOW (ref 1.15–1.40)
Calcium, Ion: 1.22 mmol/L (ref 1.15–1.40)
Calcium, Ion: 1.22 mmol/L (ref 1.15–1.40)
Calcium, Ion: 1.25 mmol/L (ref 1.15–1.40)
HCT: 33 % — ABNORMAL LOW (ref 36.0–46.0)
HCT: 35 % — ABNORMAL LOW (ref 36.0–46.0)
HCT: 36 % (ref 36.0–46.0)
HCT: 36 % (ref 36.0–46.0)
Hemoglobin: 11.2 g/dL — ABNORMAL LOW (ref 12.0–15.0)
Hemoglobin: 11.9 g/dL — ABNORMAL LOW (ref 12.0–15.0)
Hemoglobin: 12.2 g/dL (ref 12.0–15.0)
Hemoglobin: 12.2 g/dL (ref 12.0–15.0)
O2 Saturation: 81 %
O2 Saturation: 86 %
O2 Saturation: 86 %
O2 Saturation: 87 %
Potassium: 3 mmol/L — ABNORMAL LOW (ref 3.5–5.1)
Potassium: 3.5 mmol/L (ref 3.5–5.1)
Potassium: 3.6 mmol/L (ref 3.5–5.1)
Potassium: 3.6 mmol/L (ref 3.5–5.1)
Sodium: 135 mmol/L (ref 135–145)
Sodium: 140 mmol/L (ref 135–145)
Sodium: 141 mmol/L (ref 135–145)
Sodium: 141 mmol/L (ref 135–145)
TCO2: 29 mmol/L (ref 22–32)
TCO2: 32 mmol/L (ref 22–32)
TCO2: 34 mmol/L — ABNORMAL HIGH (ref 22–32)
TCO2: 35 mmol/L — ABNORMAL HIGH (ref 22–32)
pCO2, Ven: 51.1 mmHg (ref 44.0–60.0)
pCO2, Ven: 51.3 mmHg (ref 44.0–60.0)
pCO2, Ven: 53.9 mmHg (ref 44.0–60.0)
pCO2, Ven: 55.2 mmHg (ref 44.0–60.0)
pH, Ven: 7.335 (ref 7.250–7.430)
pH, Ven: 7.383 (ref 7.250–7.430)
pH, Ven: 7.386 (ref 7.250–7.430)
pH, Ven: 7.388 (ref 7.250–7.430)
pO2, Ven: 47 mmHg — ABNORMAL HIGH (ref 32.0–45.0)
pO2, Ven: 54 mmHg — ABNORMAL HIGH (ref 32.0–45.0)
pO2, Ven: 55 mmHg — ABNORMAL HIGH (ref 32.0–45.0)
pO2, Ven: 56 mmHg — ABNORMAL HIGH (ref 32.0–45.0)

## 2019-11-09 LAB — CBC
HCT: 34.6 % — ABNORMAL LOW (ref 36.0–46.0)
HCT: 34.7 % — ABNORMAL LOW (ref 36.0–46.0)
HCT: 39.2 % (ref 36.0–46.0)
Hemoglobin: 10.6 g/dL — ABNORMAL LOW (ref 12.0–15.0)
Hemoglobin: 10.7 g/dL — ABNORMAL LOW (ref 12.0–15.0)
Hemoglobin: 12 g/dL (ref 12.0–15.0)
MCH: 22.5 pg — ABNORMAL LOW (ref 26.0–34.0)
MCH: 22.6 pg — ABNORMAL LOW (ref 26.0–34.0)
MCH: 22.7 pg — ABNORMAL LOW (ref 26.0–34.0)
MCHC: 30.6 g/dL (ref 30.0–36.0)
MCHC: 30.6 g/dL (ref 30.0–36.0)
MCHC: 30.8 g/dL (ref 30.0–36.0)
MCV: 73.5 fL — ABNORMAL LOW (ref 80.0–100.0)
MCV: 73.7 fL — ABNORMAL LOW (ref 80.0–100.0)
MCV: 73.9 fL — ABNORMAL LOW (ref 80.0–100.0)
Platelets: 261 10*3/uL (ref 150–400)
Platelets: 265 10*3/uL (ref 150–400)
Platelets: 281 10*3/uL (ref 150–400)
RBC: 4.68 MIL/uL (ref 3.87–5.11)
RBC: 4.71 MIL/uL (ref 3.87–5.11)
RBC: 5.33 MIL/uL — ABNORMAL HIGH (ref 3.87–5.11)
RDW: 16.6 % — ABNORMAL HIGH (ref 11.5–15.5)
RDW: 16.6 % — ABNORMAL HIGH (ref 11.5–15.5)
RDW: 16.7 % — ABNORMAL HIGH (ref 11.5–15.5)
WBC: 4.2 10*3/uL (ref 4.0–10.5)
WBC: 4.2 10*3/uL (ref 4.0–10.5)
WBC: 5.7 10*3/uL (ref 4.0–10.5)
nRBC: 0 % (ref 0.0–0.2)
nRBC: 0 % (ref 0.0–0.2)
nRBC: 0 % (ref 0.0–0.2)

## 2019-11-09 LAB — BASIC METABOLIC PANEL
Anion gap: 7 (ref 5–15)
BUN: 14 mg/dL (ref 8–23)
CO2: 31 mmol/L (ref 22–32)
Calcium: 9 mg/dL (ref 8.9–10.3)
Chloride: 102 mmol/L (ref 98–111)
Creatinine, Ser: 0.94 mg/dL (ref 0.44–1.00)
GFR calc Af Amer: >60 mL/min (ref >60)
GFR calc non Af Amer: >60 mL/min (ref >60)
Glucose, Bld: 99 mg/dL (ref 70–99)
Potassium: 3.5 mmol/L (ref 3.5–5.1)
Sodium: 140 mmol/L (ref 135–145)

## 2019-11-09 LAB — GLUCOSE, CAPILLARY
Glucose-Capillary: 47 mg/dL — ABNORMAL LOW (ref 70–99)
Glucose-Capillary: 114 mg/dL — ABNORMAL HIGH (ref 70–99)
Glucose-Capillary: 54 mg/dL — ABNORMAL LOW (ref 70–99)
Glucose-Capillary: 61 mg/dL — ABNORMAL LOW (ref 70–99)
Glucose-Capillary: 123 mg/dL — ABNORMAL HIGH (ref 70–99)
Glucose-Capillary: 130 mg/dL — ABNORMAL HIGH (ref 70–99)

## 2019-11-09 LAB — HEMOGLOBIN A1C
Hgb A1c MFr Bld: 7.6 % — ABNORMAL HIGH (ref 4.8–5.6)
Mean Plasma Glucose: 171 mg/dL

## 2019-11-09 LAB — ECHOCARDIOGRAM COMPLETE: Weight: 3410.96 oz

## 2019-11-09 LAB — LIPID PANEL
Cholesterol: 142 mg/dL (ref 0–200)
HDL: 64 mg/dL (ref >40)
LDL Cholesterol: 58 mg/dL (ref 0–99)
Total CHOL/HDL Ratio: 2.2 RATIO
Triglycerides: 102 mg/dL (ref <150)
VLDL: 20 mg/dL (ref 0–40)

## 2019-11-09 LAB — CREATININE, SERUM
Creatinine, Ser: 0.83 mg/dL (ref 0.44–1.00)
GFR calc Af Amer: >60 mL/min (ref >60)
GFR calc non Af Amer: >60 mL/min (ref >60)

## 2019-11-09 LAB — BRAIN NATRIURETIC PEPTIDE: B Natriuretic Peptide: 116.6 pg/mL — ABNORMAL HIGH (ref 0.0–100.0)

## 2019-11-09 LAB — TSH: TSH: 1.5 u[IU]/mL (ref 0.350–4.500)

## 2019-11-09 LAB — TROPONIN I (HIGH SENSITIVITY): Troponin I (High Sensitivity): 8 ng/L (ref <18)

## 2019-11-09 SURGERY — PRESSURE SENSOR/CARDIOMEMS
Anesthesia: LOCAL

## 2019-11-09 MED ORDER — LIDOCAINE HCL (PF) 1 % IJ SOLN
INTRAMUSCULAR | Status: AC
  Filled 2019-11-09: qty 30

## 2019-11-09 MED ORDER — MIDAZOLAM HCL 2 MG/2ML IJ SOLN
INTRAMUSCULAR | Status: AC
  Filled 2019-11-09: qty 2

## 2019-11-09 MED ORDER — LABETALOL HCL 5 MG/ML IV SOLN: 10.0000 mg | INTRAVENOUS | Status: AC | PRN

## 2019-11-09 MED ORDER — SODIUM CHLORIDE 0.9% FLUSH
3.0000 mL | Freq: Two times a day (BID) | INTRAVENOUS | Status: DC
  Administered 2019-11-09: 3 mL via INTRAVENOUS

## 2019-11-09 MED ORDER — SODIUM CHLORIDE 0.9% FLUSH
3.0000 mL | Freq: Two times a day (BID) | INTRAVENOUS | Status: DC
  Administered 2019-11-09 – 2019-11-11 (×4): 3 mL via INTRAVENOUS

## 2019-11-09 MED ORDER — ONDANSETRON HCL 4 MG/2ML IJ SOLN: 4.0000 mg | Freq: Four times a day (QID) | INTRAMUSCULAR | Status: DC | PRN

## 2019-11-09 MED ORDER — SENNOSIDES-DOCUSATE SODIUM 8.6-50 MG PO TABS: 2.0000 | ORAL_TABLET | Freq: Every evening | ORAL | Status: DC | PRN

## 2019-11-09 MED ORDER — SODIUM CHLORIDE 0.9% FLUSH: 3.0000 mL | INTRAVENOUS | Status: DC | PRN

## 2019-11-09 MED ORDER — IOHEXOL 350 MG/ML SOLN
INTRAVENOUS | Status: DC | PRN
  Administered 2019-11-09: 10 mL

## 2019-11-09 MED ORDER — FENTANYL CITRATE (PF) 100 MCG/2ML IJ SOLN
INTRAMUSCULAR | Status: AC
  Filled 2019-11-09: qty 2

## 2019-11-09 MED ORDER — ENOXAPARIN SODIUM 40 MG/0.4ML ~~LOC~~ SOLN
40.0000 mg | SUBCUTANEOUS | Status: DC
  Administered 2019-11-10 – 2019-11-11 (×2): 40 mg via SUBCUTANEOUS
  Filled 2019-11-09 (×2): qty 0.4

## 2019-11-09 MED ORDER — POLYETHYLENE GLYCOL 3350 17 G PO PACK: 17.0000 g | PACK | Freq: Every day | ORAL | Status: DC | PRN

## 2019-11-09 MED ORDER — DEXTROSE 50 % IV SOLN
1.0000 | Freq: Once | INTRAVENOUS | Status: AC
  Administered 2019-11-09: 50 mL via INTRAVENOUS
  Filled 2019-11-09: qty 50

## 2019-11-09 MED ORDER — HYDRALAZINE HCL 20 MG/ML IJ SOLN: 10.0000 mg | INTRAMUSCULAR | Status: AC | PRN

## 2019-11-09 MED ORDER — SODIUM CHLORIDE 0.9 % IV SOLN: 250.0000 mL | INTRAVENOUS | Status: DC | PRN

## 2019-11-09 MED ORDER — SODIUM CHLORIDE 0.9 % IV SOLN
INTRAVENOUS | Status: AC | PRN
  Administered 2019-11-09: 10 mL/h via INTRAVENOUS

## 2019-11-09 MED ORDER — HEPARIN (PORCINE) IN NACL 1000-0.9 UT/500ML-% IV SOLN
INTRAVENOUS | Status: AC
  Filled 2019-11-09: qty 500

## 2019-11-09 MED ORDER — SODIUM CHLORIDE 0.9 % IV SOLN: INTRAVENOUS | Status: DC

## 2019-11-09 MED ORDER — FENTANYL CITRATE (PF) 100 MCG/2ML IJ SOLN
INTRAMUSCULAR | Status: DC | PRN
  Administered 2019-11-09: 25 ug via INTRAVENOUS

## 2019-11-09 MED ORDER — LIDOCAINE HCL (PF) 1 % IJ SOLN
INTRAMUSCULAR | Status: DC | PRN
  Administered 2019-11-09: 15 mL

## 2019-11-09 MED ORDER — DEXTROSE 50 % IV SOLN
25.0000 g | INTRAVENOUS | Status: AC
  Administered 2019-11-09: 25 g via INTRAVENOUS
  Filled 2019-11-09: qty 50

## 2019-11-09 MED ORDER — ACETAMINOPHEN 325 MG PO TABS: 650.0000 mg | ORAL_TABLET | ORAL | Status: DC | PRN

## 2019-11-09 MED ORDER — MIDAZOLAM HCL 2 MG/2ML IJ SOLN
INTRAMUSCULAR | Status: DC | PRN
  Administered 2019-11-09: 1 mg via INTRAVENOUS

## 2019-11-09 MED ORDER — HEPARIN (PORCINE) IN NACL 1000-0.9 UT/500ML-% IV SOLN
INTRAVENOUS | Status: DC | PRN
  Administered 2019-11-09 (×2): 500 mL

## 2019-11-09 MED ORDER — INSULIN GLARGINE 100 UNIT/ML ~~LOC~~ SOLN
20.0000 [IU] | Freq: Every day | SUBCUTANEOUS | Status: DC
  Filled 2019-11-09: qty 0.2

## 2019-11-09 SURGICAL SUPPLY — 14 items
CARDIOMEMS PA SENSOR W/DELIVER (Prosthesis & Implant Heart) ×2 IMPLANT
CATH SWAN GANZ 7F STRAIGHT (CATHETERS) ×1 IMPLANT
COVER DOME SNAP 22 D (MISCELLANEOUS) ×1 IMPLANT
PACK CARDIAC CATHETERIZATION (CUSTOM PROCEDURE TRAY) ×1 IMPLANT
SENSOR CARDIOMEMS PA W/DELIVER (Prosthesis & Implant Heart) IMPLANT
SHEATH FAST CATH 12F 12CM (SHEATH) ×1 IMPLANT
SHEATH PINNACLE 7F 10CM (SHEATH) ×1 IMPLANT
SHEATH PROBE COVER 6X72 (BAG) ×1 IMPLANT
TRANSDUCER W/STOPCOCK (MISCELLANEOUS) ×1 IMPLANT
TUBING ART PRESS 72  MALE/FEM (TUBING) ×2
TUBING ART PRESS 72 MALE/FEM (TUBING) IMPLANT
WIRE EMERALD 3MM-J .025X260CM (WIRE) ×1 IMPLANT
WIRE EMERALD 3MM-J .035X150CM (WIRE) ×1 IMPLANT
WIRE NITREX .018X300 STIFF (WIRE) ×1 IMPLANT

## 2019-11-09 NOTE — Chronic Care Management (AMB) (Signed)
  Chronic Care Management   Inpatient Admit Review Note  11/09/2019 Name: [name] MRN: [id number] DOB: 11/01/1945  [name] is a 74 y.o. year old female who is a primary care patient of [name], MD. [name] is actively engaged with the embedded care management team in the primary care practice and is being followed by RN Case Manager, Pharmacist, and BSW for assistance with disease management and care coordination needs related to CHF and DMII.   [name] is currently admitted to the hospital for evaluation and treatment of near syncope. Current treatment plan is repeat TTE and monitor on telemetry. Proceed with CardioMEMS placement on 11/09/19.   Plan: CM team will collaborate with [facility] and will follow patient post discharge.   Daneen Schick, BSW, CDP Social Worker, Certified Dementia Practitioner [facility] [phone]  Total time spent performing care coordination and/or care management activities with the patient by phone or face to face = 12 minutes.

## 2019-11-09 NOTE — Progress Notes (Addendum)
Progress Note  Patient Name: [name] Date of Encounter: 11/09/2019  Primary Cardiologist: Glenetta Hew, MD   Subjective   Pt feels well this morning, able to lay flat.   Inpatient Medications    Scheduled Meds: . atorvastatin  10 mg Oral QPM  . carvedilol  25 mg Oral BID WC  . heparin  5,000 Units Subcutaneous Q8H  . hydrALAZINE  100 mg Oral BID  . insulin aspart  0-15 Units Subcutaneous TID WC  . insulin aspart  0-5 Units Subcutaneous QHS  . insulin aspart  6 Units Subcutaneous TID WC  . insulin glargine  36 Units Subcutaneous QHS  . isosorbide mononitrate  60 mg Oral Daily  . lamoTRIgine  150 mg Oral BID  . levETIRAcetam  500 mg Oral BID  . lipase/protease/amylase  36,000 Units Oral Q24H  . lipase/protease/amylase   Oral BID WC  . sertraline  75 mg Oral Daily  . sodium chloride flush  3 mL Intravenous Once  . sodium chloride flush  3 mL Intravenous Q12H  . sodium chloride flush  3 mL Intravenous Q12H  . torsemide  40 mg Oral Daily   Continuous Infusions: . sodium chloride    . sodium chloride     PRN Meds: sodium chloride, acetaminophen **OR** acetaminophen, acetaminophen, ondansetron **OR** ondansetron (ZOFRAN) IV, polyethylene glycol, sodium chloride flush   Vital Signs    Vitals:   11/09/19 0448 11/09/19 0500 11/09/19 0817 11/09/19 0826  BP: 134/68  (!) 141/74 (!) 141/74  Pulse: 67   64  Resp:      Temp: 98.5 F (36.9 C)   98.4 F (36.9 C)  TempSrc: Oral   Oral  SpO2: 97%   98%  Weight:  96.7 kg      Intake/Output Summary (Last 24 hours) at 11/09/2019 0841 Last data filed at 11/09/2019 0616 Gross per 24 hour  Intake 240 ml  Output 1000 ml  Net -760 ml   Last 3 Weights 11/09/2019 11/08/2019 09/28/2019  Weight (lbs) 213 lb 3 oz 213 lb 4.8 oz (No Data)  Weight (kg) 96.7 kg 96.752 kg (No Data)      Telemetry    Sinus rhythm in the 60s  - Personally Reviewed  ECG    No new tracings - Personally Reviewed  Physical Exam   GEN: No acute  distress.   Neck: + JVD Cardiac: RRR, no murmurs, rubs, or gallops.  Respiratory: Clear to auscultation bilaterally. GI: Soft, nontender, non-distended  MS: No edema; No deformity. Neuro:  Nonfocal  Psych: Normal affect   Labs    High Sensitivity Troponin:   Recent Labs  Lab 11/08/19 1306 11/08/19 1549 11/09/19 0021  TROPONINIHS 8 7 8       Chemistry Recent Labs  Lab 11/06/19 1142 11/08/19 1306 11/09/19 0324  NA 136 136 140  K 3.8 4.4 3.5  CL 99 96* 102  CO2 27 28 31   GLUCOSE 117* 195* 99  BUN 22 16 14   CREATININE 1.16* 1.05* 0.94  CALCIUM 9.4 9.4 9.0  GFRNONAA 47* 53* >60  GFRAA 55* >60 >60  ANIONGAP 10 12 7      Hematology Recent Labs  Lab 11/08/19 1306 11/09/19 0021 11/09/19 0324  WBC 4.4 4.2 4.2  RBC 4.95 4.68 4.71  HGB 11.3* 10.6* 10.7*  HCT 37.4 34.6* 34.7*  MCV 75.6* 73.9* 73.7*  MCH 22.8* 22.6* 22.7*  MCHC 30.2 30.6 30.8  RDW 16.7* 16.6* 16.6*  PLT 276 265 261  BNP Recent Labs  Lab 11/09/19 0021  BNP 116.6*     DDimer No results for input(s): DDIMER in the last 168 hours.   Radiology    No results found.  Cardiac Studies   Cardiac monitor 02/27/09:  Predominant rhythm sinus with rates ranging from 52 to 92 bpm. Average 65 bpm.  Rare, <1% PACs or PVCs. No couplets or triplets noted, and very rare ventricular bigeminy noted.  Total of 20 short bursts of PSVT/PAT ranging from 5-10 beats in heart rate from 103 to 132 bpm.  Most of patient's symptoms were noted with sinus rhythm, or sinus rhythm with rare PACs/PVCs. Symptoms not noted during episodes of PSVT or PAT.  No atrial fibrillation, atrial flutter or prolonged PSVT/PAT noted.  No bradycardia events noted. Sinus bradycardia noted during sleeping hours.  Overall, relatively reassuring study. There were several short little bursts of fast heart rates, but not noted on symptoms. Probably not long enough to make patient feel lightheaded, dizzy or short of breath. Would only  be felt as a quick little bursts of fluttering.  Would not be inclined to treat.  RHC/RHC 04/06/19:  Hemodynamic findings consistent with mild pulmonary hypertension -with moderately elevated LVEDP  There is no aortic valve stenosis.  Angiographically normal coronary arteries with LEFT-LAD dominant system  SUMMARY  Angiographically normal coronary arteries with essentially LAD dominant system.  Right Heart Cath pressures show mild-moderate pulmonary hypertension with elevated LVEDP suggesting combination of diastolic dysfunction related as well as obesity hypoventilation related pulmonary hypertension  Normal/hyperdynamic cardiac output and index.   RECOMMENDATIONS  Discharge home after bedrest.  For now continue current medications as she seems to be adequately diuresed. We will need to avoid overdiuresis in the future. -->Shorter courses of increased Lasix for worsening edema or dyspnea.  Will review with advanced heart failure team.  TTE 02/25/19: 1. The left ventricle has low normal systolic function, with an ejection  fraction of 50-55%. The cavity size was normal. Left ventricular diastolic  Doppler parameters are consistent with impaired relaxation. No evidence of  left ventricular regional wall  motion abnormalities.  2. Although TAPSE is measured as normal, the right ventricle visually  appears to have moderate to severely reduced systolic function. The cavity  was severely enlarged. There is no increase in right ventricular wall  thickness. Right ventricular systolic  pressure could not be assessed.  3. Left atrial size was mildly dilated.  4. Hypermobile interatrial septum.  5. Right atrial size was severely dilated.  6. The aorta is normal unless otherwise noted.  7. The inferior vena cava was dilated in size with <50% respiratory  variability.   Patient Profile     74 y.o. female with a hx of OHS/OSA, CVA, seizure disorder, type 2  diabetes, hypertension, hyperlipidemia, chronic diastolic heart failure, pulmonary hypertension who is being seen today for the evaluation of syncope.  Assessment & Plan    1. Syncope - she presented after an episode of syncope, etiology unclear - pt described heart racing prior to event - reviewed last heart monitor with short bursts of PSVT/PAT but no symptoms noted - not treated as these were not associated with symptoms and short in duration (5-10 beats) - continue with cardioMEMS placement - echo pending - I was able to connect with her sister [name] who helps with history --> she reports that she has passed out 4-6 times since wearing the 14 day heart monitor in February - may need to consider loop recorder  2. Chronic diastolic heart failure 3. Pulmonary hypertension  - echo 02/2019 with preserved EF - continue PO demadex - right heart cath 03/2019 with mild to moderate pulmonary HTN Moderate to severe RV dilation with mild to moderate decrease in RV systolic function - proceed with cardiomems placement   4. Hypertension -  Continue coreg 25 mg BID, hydralazine 100 mg BID, torsmide 40 mg PO   5. Hyperlipidemia - will add updated lipid profile to AM labs - continue statin   6. OHS/OSA - on CPAP but no oxygen    For questions or updates, please contact [place] Please consult [email or web address] for contact info under        Signed, Ledora Bottcher, PA  11/09/2019, 8:41 AM    Patient seen with PA, agree with the above note.   No significant arrhythmias noted on telemetry.  No dyspnea this morning.   RHC/Cardiomems placement this morning: 1. Successful deployment of Cardiomems pressure sensor.  2. Normal RA pressure and PCWP.  3. Moderate pulmonary hypertension, PVR not significantly elevated.  Think PH is due to high cardiac output.  4. Despite high calculated cardiac output, no evidence for significant intracardiac shunt lesion.   General: NAD,  obese. Neck: No JVD, no thyromegaly or thyroid nodule.  Lungs: Clear to auscultation bilaterally with normal respiratory effort. CV: Nondisplaced PMI.  Heart regular S1/S2, no S3/S4, no murmur.  No peripheral edema.   Abdomen: Soft, nontender, no hepatosplenomegaly, no distention.  Skin: Intact without lesions or rashes.  Neurologic: Alert and oriented x 3.  Psych: Normal affect. Extremities: No clubbing or cyanosis.  HEENT: Normal.   Volume status stable, would continue torsemide 40 mg po daily.  She has moderate pulmonary hypertension that appears to be due to high cardiac output.  No evidence for intracardiac shunt lesion by oxygen saturation evaluation.   Syncopal episodes of uncertain etiology.  Monitoring has not managed to catch an event but episodes continue periodically.  ?vasovagal versus arrhythmic.   - I consulted EP for placement of a Linq device for long-term rhythm monitoring.   Hopefully ready to go home tomorrow.   Loralie Champagne 11/09/2019 10:29 AM  Patient had an episode of altered consciousness + shaking/seizure during echo today.  This was similar to prior episodes.  She was on the monitor (echo ECG leads) and remained in NSR.  I suspect that her passing out spells may be seizures.  She has a history of seizure disorder.  We will ask neurology to see her today.   Loralie Champagne 11/09/2019 12:36 PM

## 2019-11-09 NOTE — Progress Notes (Addendum)
PT Cancellation Note  Patient Details Name: [name] MRN: [id number] DOB: [birth year]   Cancelled Treatment:    Reason Eval/Treat Not Completed: Medical issues which prohibited therapy;Active bedrest order. Pt recently returned to floor following cardiac procedure. Currently on bedrest following removal of femoral sheath. She also just had a witnessed seizure during ECHO. Staff in room tending to pt. PT to re-attempt eval as time allows and pt able to participate.   Lorriane Shire 11/09/2019, 12:17 PM   Lorrin Goodell, PT  Office # [phone] Pager [phone]

## 2019-11-09 NOTE — Consult Note (Signed)
ELECTROPHYSIOLOGY CONSULT NOTE  Patient ID: [name], MRN: [id number], DOB/AGE: 1945-08-15 74 y.o. Admit date: 11/08/2019 Date of Consult: 11/09/2019  Primary Physician: [name], MD Primary Cardiologist: DM     [name] is a 74 y.o. female who is being seen today for the evaluation of syncope at the request of DMcl.    HPI [name] is a 74 y.o. female with a history of repeated episodes of loss of consciousness.  These episodes are associated with a stereotypical prodrome; of prolonged duration 90 minutes and confusion and lethargy upon awakening.  Today she had an episode while on the monitor during her echocardiogram and her rhythm is normal.\  Dr DM has spoken with sister [name], with whom I also spoke   She recounts 4-6 of these spells since February and notes that the one today was typical  There have been assoc palps but nothing appreciated on monitor   DATE TEST EF   11/19 LHC  No obstructive CAD  9/20 Echo   50-55 % Mod-sev RV dilatation/ decrease fn  10/20 RHC    % LVEDP18; PVR2.44  PCWP 13        Date Cr K Hgb  5/21 0.94 3.5 10.7         She currently is unresponsive to questions following episode    Past Medical History:  Diagnosis Date  . Abnormal liver function     in the past.  . Anemia   . Arthritis    all over  . Bruises easily   . Cataract    right eye;immature  . Chronic back pain    stenosis  . Chronic cough   . Chronic kidney disease   . COPD (chronic obstructive pulmonary disease) ([place])   . Demand myocardial infarction [facility]) 2012   Demand Infarction in setting of Pancreatitis --> mild Troponin elevation, NON-OBSTRUCTIVE CAD  . Depression    takes Abilify daily as well as Zoloft  . Diastolic heart failure [id number]   Grade 1 diastolic Dysfunction by Echo   . Diverticulosis   . DM (diabetes mellitus) ([place])    takes Victoza daily as well as Lantus and Humalog  . Empty sella ([place])    on MRI in 2009.    . Glaucoma   . Headache(784.0)    last migraine-4-8yrs ago  . History of blood transfusion    no abnormal reaction noted  . History of colon polyps    benign  . HTN (hypertension)    takes [employer] daily  . Hyperlipidemia    takes Lipitor daily  . Joint swelling   . Nocturia   . Obesity hypoventilation syndrome ([place])   . Obstructive sleep apnea 02/2018   Notably improved split-night study with weight loss from 270 (2017) down to 250 pounds (2019).  . Pancreatitis    takes Pancrelipase daily  . Parkinson's disease ([place])    takes Sinemet daily  . Peripheral neuropathy   . Pneumonia 2012  . Seizures ([place])    takes Lamictal daily and Primidone nightly;last seizure 2wks ago  . Urinary urgency    With increased frequency  . Varicose veins of both lower extremities with pain    With edema.  Takes daily Lasix      Surgical History:  Past Surgical History:  Procedure Laterality Date  . ABDOMINAL HYSTERECTOMY    . APPENDECTOMY    . BACK SURGERY    . Cardiac Event Monitor  September-October 2017   Sinus rhythm with occasional PACs and artifact. No arrhythmias besides one short run of tachycardia.  . CHOLECYSTECTOMY    . COLONOSCOPY N/A 08/15/2012   Procedure: COLONOSCOPY;  Surgeon: Arta Silence, MD;  Location: [facility];  Service: Endoscopy;  Laterality: N/A;  . COLONOSCOPY WITH PROPOFOL Left 06/17/2017   Procedure: COLONOSCOPY WITH PROPOFOL;  Surgeon: [name], MD;  Location: [facility];  Service: Gastroenterology;  Laterality: Left;  . CORONARY CALCIUM SCORE AND CTA  04/2018   Coronary calcium score 71.9.  Very large, hyperdynamic LAD wrapping apex giving rise to PDA.  Moderate LAD-diagonal and circumflex plaque -> CT FFR suggested positive findings in distal D1, D2 and distal circumflex.  Referred for cath. ==> FALSE POSITIVE  . ESOPHAGOGASTRODUODENOSCOPY    . eye cysts Bilateral   . LASIK    . LEFT HEART CATH AND CORONARY ANGIOGRAPHY N/A  05/16/2018   Procedure: LEFT HEART CATH AND CORONARY ANGIOGRAPHY;  Surgeon: Belva Crome, MD;  Location: [facility] INVASIVE CV LAB:  Angiographically normal coronary arteries with LVEDP of 21 mmHg.  Large draping hyperdominant LAD that wraps the apex and provides distal half of the PDA.  Relatively small caliber distal Cx -->proxLPDA, non-dom RCA. No Cx or Diag lesions -- FALSE + CT FFR  . LEFT HEART CATH AND CORONARY ANGIOGRAPHY  2009/March 2012   2009: (Dr. Alvie Heidelberg) Nonobstructive CAD; 2012: Minimal CAD --> false positive stress test  . NM MYOVIEW LTD  01/2016; 01/2018   a)LOW RISK. No ischemia or infarction;; b) EF 55-60%. NO ST changes.  No ischemia or Infarction. NO significant RV enlargement.  LOW RISK.  Marland Kitchen Polysomnogram  02/2018   (Dr. Brett Fairy from neurology): Split study was not adequately done due to low AHI.  She slept reclined and had an AHI less than 10, prolonged hypoxemia and no hypercapnia. -->  She has home oxygen already prescribed, but did not meet criteria for nightly oxygen.  (Was noted that the patient did not cooperate well with study.  Titration was discussed.  Marland Kitchen RECTAL POLYPECTOMY    . RIGHT/LEFT HEART CATH AND CORONARY ANGIOGRAPHY N/A 04/06/2019   Procedure: RIGHT/LEFT HEART CATH AND CORONARY ANGIOGRAPHY;  Surgeon: Leonie Man, MD;  Location: [facility] INVASIVE CV LAB; angiographically normal coronary arteries.  Moderately elevated AD LVEDP..  Mild pulmonary hypertension: PA P 56/14 mmHg-mean 31 mmHg.  RAP 10 mmHg.  RV P-EDP 54/4 mmHg - 12 mmHg.  PCWP 11-15 mmHg.  CO-CI: 7.35-3.73.  . TONSILLECTOMY    . TRANSTHORACIC ECHOCARDIOGRAM  01/2016; 05/2017   A) Normal LV chamber size with mod LVH pattern. EF 50-55%. Severe LA dilation. Mod RA dilation. PAP elevated at 38 mmHg;; B) Mild concentric LVH.  EF 55-60% & no RWMA.  Grade 2 DD.  Diastolic flattening of the ventricular septum == ? elevated PAP.  Mild aortic valve calcification/sclerosis.  Mod LA dilation.  Mild RV dilation & Mod RA  dilation  . TRANSTHORACIC ECHOCARDIOGRAM  10/2017; 01/2018   a) EF 55-60%.  No RWMA,  Moderate LA dilation.  Mild LA dilation.  Peak PA pressures ~57 mmHg (moderate);;; b) EF 55-60%.  GRII DD.  Suggestion of RV volume overload with moderate RV dilation.  Severe LA and RA dilation.Marland Kitchen  PA pressure ~67%.  . TRANSTHORACIC ECHOCARDIOGRAM  01/2019   Normal LV size and function.  EF 50 to 55%.  Impaired relaxation.  RV appears to have moderately reduced function.  Severely enlarged.  Cannot fully assess RV pressures.  Hypermobile  interatrial septum.  Right atrium severely dilated.  . TUBAL LIGATION    . vaginal cyst removed     several times     Home Meds: Current Meds  Medication Sig  . acetaminophen (TYLENOL) 500 MG tablet Take 500-1,000 mg by mouth every 4 (four) hours as needed for moderate pain.   Marland Kitchen albuterol (VENTOLIN HFA) 108 (90 Base) MCG/ACT inhaler Inhale 1-2 puffs into the lungs every 6 (six) hours as needed for wheezing or shortness of breath.  Marland Kitchen atorvastatin (LIPITOR) 10 MG tablet Take 10 mg by mouth every evening.  . B Complex-C (B-COMPLEX WITH VITAMIN C) tablet Take 1 tablet by mouth daily.  . carvedilol (COREG) 25 MG tablet Take 1 tablet (25 mg total) by mouth 2 (two) times daily with a meal.  . Cholecalciferol (VITAMIN D-3) 1000 units CAPS Take 1,000 Units by mouth daily.  . diclofenac sodium (VOLTAREN) 1 % GEL Apply 2 g topically 4 (four) times daily as needed (pain).  . furosemide (LASIX) 40 MG tablet TAKE ONE TABLET BY MOUTH SATURDAY AND SUNDAY (Patient taking differently: Take 40 mg by mouth See admin instructions. Daily on Sat and Sun only)  . furosemide (LASIX) 80 MG tablet TAKE ONE TABLET BY MOUTH DAILY  MONDAY THRU FRIDAY (Patient taking differently: Take 80 mg by mouth See admin instructions. TAKE ONE TABLET BY MOUTH DAILY  MONDAY THRU [place])  . hydrALAZINE (APRESOLINE) 100 MG tablet Take 1 tablet (100 mg total) by mouth 2 (two) times daily.  . hydrocortisone cream 1 % Apply  1 application topically daily as needed for itching.  . insulin glargine, 1 Unit Dial, (TOUJEO) 300 UNIT/ML Solostar Pen Inject 36 Units into the skin at bedtime.   . insulin lispro (HUMALOG KWIKPEN) 100 UNIT/ML KwikPen Inject 0.02-0.04 mLs (2-4 Units total) into the skin 3 (three) times daily as needed (high blood sugar over 150).  . isosorbide mononitrate (IMDUR) 60 MG 24 hr tablet Take 1 tablet by mouth once daily  . lamoTRIgine (LAMICTAL) 150 MG tablet Take 1 tablet (150 mg total) by mouth 2 (two) times daily.  Marland Kitchen levETIRAcetam (KEPPRA) 500 MG tablet Take 1 tablet every night (Patient taking differently: Take 500 mg by mouth 2 (two) times daily. )  . Menthol, Topical Analgesic, (STOPAIN ROLL-ON EX) Apply 1 application topically daily as needed (pain).  Marland Kitchen metolazone (ZAROXOLYN) 2.5 MG tablet Take 2.5 mg by mouth See admin instructions. Take 2.5 mg by mouth Monday through Friday as needed if weight is greater or equal to 269 lbs  . Multiple Vitamins-Minerals (MULTIVITAMIN WITH MINERALS) tablet Take 1 tablet by mouth daily.  Marland Kitchen neomycin-bacitracin-polymyxin (NEOSPORIN) ointment Apply 1 application topically as needed for wound care.  Marland Kitchen OZEMPIC, 1 MG/DOSE, 2 MG/1.5ML SOPN INJECT 1MG  INTO THE SKIN ONCE A WEEK (Patient taking differently: Inject 1 mg into the muscle once a week. Thursday)  . Pancrelipase, Lip-Prot-Amyl, (ZENPEP) 25000-79000 units CPEP Take 1-2 capsules by mouth See admin instructions. Take 2 capsules in the morning, 1 capsule with lunch, and 2 capsules in the evening.  . potassium chloride SA (KLOR-CON) 20 MEQ tablet Take 40 meq daily, when taking metolazone take a total of 60 meq daily (Patient taking differently: Take 40-60 mEq by mouth See admin instructions. Take 40 meq daily, when taking metolazone take a total of 60 meq daily )  . sertraline (ZOLOFT) 25 MG tablet Take 25 mg by mouth See admin instructions. Take with 50 mg to equal 75 mg at bedtime  .  sertraline (ZOLOFT) 50 MG tablet  TAKE 1 TABLET EVERY DAY (Patient taking differently: Take 50 mg by mouth See admin instructions. Take with 25 mg to equal 75 mg at bedtime)  . tolnaftate (TINACTIN) 1 % cream Apply 1 application topically daily as needed (foot fungus).     Allergies:  Allergies  Allergen Reactions  . Other Anaphylaxis and Rash    Bleach  . Penicillins Hives    Did it involve swelling of the face/tongue/throat, SOB, or low BP? No Did it involve sudden or severe rash/hives, skin peeling, or any reaction on the inside of your mouth or nose? Yes Did you need to seek medical attention at a hospital or doctor's office? Yes When did it last happen?Childhood allergy  If all above answers are "NO", may proceed with cephalosporin use.    . Sulfa Antibiotics Hives  . Aspirin Other (See Comments)     On aspirin 81 mg - Rectal bleeding in dec 2018  . Codeine     Headache and makes the patient feel "off"    Social History   Socioeconomic History  . Marital status: Divorced    Spouse name: Not on file  . Number of children: Not on file  . Years of education: Not on file  . Highest education level: Not on file  Occupational History  . Occupation: Disabled    Comment: CNA  Tobacco Use  . Smoking status: Former Smoker    Packs/day: 0.50    Years: 25.00    Pack years: 12.50    Types: Cigarettes  . Smokeless tobacco: Never Used  . Tobacco comment: quit smoking 20+ytrs ago  Substance and Sexual Activity  . Alcohol use: No  . Drug use: No  . Sexual activity: Not Currently    Birth control/protection: Surgical  Other Topics Concern  . Not on file  Social History Narrative   She lives in [place]. She has lots of family in the area and is accompanied by her sister.   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   She has a distant history of smoking, quit 20 years ago.   Social Determinants of Health   Financial Resource Strain:   . Difficulty of Paying Living  Expenses:   Food Insecurity:   . Worried About Charity fundraiser in the Last Year:   . Arboriculturist in the Last Year:   Transportation Needs:   . Film/video editor (Medical):   Marland Kitchen Lack of Transportation (Non-Medical):   Physical Activity:   . Days of Exercise per Week:   . Minutes of Exercise per Session:   Stress:   . Feeling of Stress :   Social Connections:   . Frequency of Communication with Friends and Family:   . Frequency of Social Gatherings with Friends and Family:   . Attends Religious Services:   . Active Member of Clubs or Organizations:   . Attends Archivist Meetings:   Marland Kitchen Marital Status:   Intimate Partner Violence:   . Fear of Current or Ex-Partner:   . Emotionally Abused:   Marland Kitchen Physically Abused:   . Sexually Abused:      Family History  Problem Relation Age of Onset  . Allergies Father   . Heart disease Father        before 8  . Heart failure Father   . Hypertension Father   . Hyperlipidemia Father   . Heart disease Mother   .  Diabetes Mother   . Hypertension Mother   . Hyperlipidemia Mother   . Hypertension Sister   . Heart disease Sister        before 79  . Hyperlipidemia Sister   . Diabetes Brother   . Hypertension Brother   . Diabetes Sister   . Hypertension Sister   . Diabetes Son   . Hypertension Son   . Cancer Other      ROS:  Please see the history of present illness.     All other systems reviewed and negative.    Physical Exam:  Blood pressure (!) 157/79, pulse (!) 57, temperature 98.3 F (36.8 C), temperature source Oral, resp. rate (!) 21, weight 96.7 kg, SpO2 95 %. General: Well developed, well nourished female in no acute distress; somnolent after episode ? Post ictal . Head: Normocephalic, atraumatic, sclera non-icteric, no xanthomas, nares are without discharge. EENT: normal  Back: not examimend Lungs: Clear bilaterally to auscultation   Heart: RRR \ Abdomen: Soft, non-tender, non-distended   Msk:   Strength and tone appear normal for age. Extremities: No clubbing or cyanosis. No  edema.  Distal pedal pulses are 2+ and equal bilaterally. Skin: Warm and Dry Neuro: somnolent and only modestly responsive . CN III-XII intact Grossly normal sensory and motor function . Psych:  Not responding to questions   Labs: Cardiac Enzymes No results for input(s): CKTOTAL, CKMB, TROPONINI in the last 72 hours. CBC Lab Results  Component Value Date   WBC 4.2 11/09/2019   HGB 10.7 (L) 11/09/2019   HCT 34.7 (L) 11/09/2019   MCV 73.7 (L) 11/09/2019   PLT 261 11/09/2019   PROTIME: No results for input(s): LABPROT, INR in the last 72 hours. Chemistry  Recent Labs  Lab 11/09/19 0324  NA 140  K 3.5  CL 102  CO2 31  BUN 14  CREATININE 0.94  CALCIUM 9.0  GLUCOSE 99   Lipids Lab Results  Component Value Date   CHOL 142 11/09/2019   HDL 64 11/09/2019   LDLCALC 58 11/09/2019   TRIG 102 11/09/2019   BNP Pro B Natriuretic peptide (BNP)  Date/Time Value Ref Range Status  08/29/2010 04:45 AM 99.1 0.0 - 100.0 pg/mL Final  08/27/2010 06:00 AM 193.0 (H) 0.0 - 100.0 pg/mL Final  08/22/2010 04:00 AM 277.0 (H) 0.0 - 100.0 pg/mL Final  08/14/2010 02:41 PM 161.0 (H) 0.0 - 100.0 pg/mL Final   Thyroid Function Tests: Recent Labs    11/09/19 0021  TSH 1.500   Miscellaneous Lab Results  Component Value Date   DDIMER 0.81 (H) 05/15/2018    Radiology/Studies:  CARDIAC CATHETERIZATION  Result Date: 11/09/2019 1. Successful deployment of Cardiomems pressure sensor. 2. Normal RA pressure and PCWP. 3. Moderate pulmonary hypertension, PVR not significantly elevated.  Think PH is due to high cardiac output. 4. Despite high calculated cardiac output, no evidence for significant intracardiac shunt lesion.    EKG:  Sinus w RBBB/RVH   Assessment and Plan:   Spells- nonarrhythmic assoc with LOC  Right Heart Failure  Seizures    The pt serendipitously had another of her spells while in  hospital and connected to the monitor confirming these are non arrhyhtmic-- given the frequency of the spells with her known seizure disorder would recommend neuro consultation  Thanks for consult   Virl Axe

## 2019-11-09 NOTE — Progress Notes (Signed)
RR initiated this shift as pt had seizure like activity at approx 11:50. On entering the room the pt's jaw was moving back and forth and appeared to be having a seizure which lasted about 5 mins total. RR nurse and MD came to bedside to assess pt. See orders. Pt stable and does not have any complaints currently. Seizure precautions continue. Will continue to monitor.

## 2019-11-09 NOTE — Significant Event (Addendum)
Rapid Response Event Note  Overview: Time Called: [id number] Arrival Time: 1155 Event Type: Other (Comment)(Seizure) Pt had witnessed seizure during 2D ECHO lasting approximately 5 minutes. Seizure started with bilateral arm tremor and progressed to lower jaw tremor.   Initial Focused Assessment: Pt lying in bed. Awake, drowsy. Pt is able to follow commands and moves all extremities. PERRLA, 71mm. EOMI. Pt had incontinent urine episode during seizure event.  Facial weakness noted to her left lip. Grip weaker in left hand compared to right, pt states this is related to the stroke she had in the past. Right and left plantar flexion are equal. Decreased sensation on her left side, pt does not know if this is acute. Pt has drift in her right leg, she states she is weak, sore from her cath. Generalized weakness noted to bilateral lower extremities, ataxia challenging to assess. No ataxia noted to bilateral upper extremities. Abdomen is soft. Lung sounds are clear, diminished. Pt endorses pain in her back, this is not new. Right femoral site intact, soft.  Interval: Initial (05/17 1200) Level of Consciousness (1a.)   : Alert, keenly responsive (05/17 1200) LOC Questions (1b. )   +: Answers both questions correctly (05/17 1200) LOC Commands (1c. )   + : Performs both tasks correctly (05/17 1200) Best Gaze (2. )  +: Normal (05/17 1200) Visual (3. )  +: No visual loss (05/17 1200) Facial Palsy (4. )    : Minor paralysis (05/17 1200) Motor Arm, Left (5a. )   +: No drift (05/17 1200) Motor Arm, Right (5b. )   +: No drift (05/17 1200) Motor Leg, Left (6a. )   +: No drift (05/17 1200) Motor Leg, Right (6b. )   +: Drift (05/17 1200) Limb Ataxia (7. ): Absent (05/17 1200) Sensory (8. )   +: Mild-to-moderate sensory loss, patient feels pinprick is less sharp or is dull on the affected side, or there is a loss of superficial pain with pinprick, but patient is aware of being touched (05/17 1200) Best Language (9. )    +: No aphasia (05/17 1200) Dysarthria (10. ): Normal (05/17 1200) Extinction/Inattention (11.)   +: No Abnormality (05/17 1200) Modified SS Total  +: 2 (05/17 1200) Complete NIHSS TOTAL: 3 (05/17 1200)   Interventions: -CBG 61, 1 amp dextrose per Dr. Reesa Chew -Orders received for CT head, neuro checks q4h  Plan of Care (if not transferred): -Follow up with provider results of tests ordered -Neuro checks -Seizure precautions  Call rapid response for additional needs  Event Summary: Name of Physician Notified: Dr. Reesa Chew at 1200  MD at bedside to evaluate pt. MD made aware of above NIH. Outcome: Stayed in room and stabalized Event End Time: [place]

## 2019-11-09 NOTE — Progress Notes (Signed)
PROGRESS NOTE    [name]  [id number] DOB: 1945/07/10 DOA: 11/08/2019 PCP: [name], MD   Brief Narrative:  74 y.o. female with  HTN, DM, COPD, OHS/OSA chronic HFpEF, epilepsy, pancreatic insufficiency who presented with syncope.  Previously has undergone extensive cardiac work-up.  Cardiology team consulted, and CardioMEMs placement.  Hospital course complicated by seizure.   Assessment & Plan:   Active Problems:   Obesity hypoventilation syndrome (HCC)   Chronic pancreatitis (HCC)   Hypertensive heart disease with chronic diastolic congestive heart failure (HCC)   Morbid obesity with BMI of 45.0-49.9, adult (HCC)   Type II diabetes mellitus, uncontrolled ([place])   Seizure disorder ([place])   Syncope   Pulmonary hypertension, unspecified ([place])   Neuroleptic-induced Parkinsonism ([place])   Recurrent syncope-cardiac versus neuro -Has underwent previous extensive cardiac work-up.  Mostly this has been unrevealing.  Right heart cath in September 2020 showed moderate pulmonary hypertension with dilated RV.  This time she is admitted for CardioMEMS placement.  CHF team following, will be consulting EP.  Management per their service. Suspecting a lot of these are seizure spells.  Recurrent seizures with left sided facial droop and upper extremity weakness -Stat CT head, has subsided for now.  Seizure precautions.  Neurochecks.  Wonder if this weakness is CVA related versus Todd's paralysis.  History of both psychogenic and true seizures. Neurology has been consulted. Continue Lamictal and Keppra for now until seen by neurology  Diabetes mellitus type 2, uncontrolled.  Insulin-dependent -Hypoglycemia episode noted with blood glucose as low as 45.  Treated with amp of D50.  Discontinue long-acting and premeal insulin for now.  Maintain sliding scale.  Chronic diastolic CHF with right ventricular failure -Medications per cardiology team.  Currently on torsemide 40 mg daily.   Appears to be euvolemic overall. Continue Coreg, hydralazine, isosorbide mononitrate, Lipitor  Obstructive sleep apnea -CPAP at bedtime   DVT prophylaxis: Lovenox Code Status: Full code Family Communication: Sister at bedside  Status is: Observation   Dispo: The patient is from: Home              Anticipated d/c is to: Home              Anticipated d/c date is: 1 day              Patient currently is not medically stable to d/c.  Still undergoing work-up for seizure/syncope.  Unsafe for discharge.  Ongoing evaluation for left-sided weakness.   Subjective: I was called to the bedside as patient had seizure-like episode as she was getting echocardiogram.  Nurse noted that she had blank stares, left upper extremity weakness and slowly generalized shaking of her upper body.  This lasted for about 5 minutes, by the time I arrived at bedside this is subsided.  Rapid response was team at bedside.  There was some evidence of left-sided facial droop and left upper extremity weakness.  Patient is mentating well, answering appropriately only slow to respond.  Sister is at bedside.  Review of Systems Otherwise negative except as per HPI, including: General: Denies fever, chills, night sweats or unintended weight loss. Resp: Denies cough, wheezing, shortness of breath. Cardiac: Denies chest pain, palpitations, orthopnea, paroxysmal nocturnal dyspnea. GI: Denies abdominal pain, nausea, vomiting, diarrhea or constipation GU: Denies dysuria, frequency, hesitancy or incontinence MS: Denies muscle aches, joint pain or swelling Neuro: Denies headache Psych: Denies anxiety, depression, SI/HI/AVH Skin: Denies new rashes or lesions ID: Denies sick contacts, exotic exposures, travel  Examination:  General exam: Chronically ill-appearing. Respiratory system: Clear to auscultation. Respiratory effort normal. Cardiovascular system: S1 & S2 heard, RRR. No JVD, murmurs, rubs, gallops or clicks. No  pedal edema. Gastrointestinal system: Abdomen is nondistended, soft and nontender. No organomegaly or masses felt. Normal bowel sounds heard. Central nervous system: Alert and oriented. Left upper extremity strength 4/5, rest of extremities 4+/5 Extremities: No edema noted Skin: No rashes, lesions or ulcers Psychiatry: Poor judgment and insight    Objective: Vitals:   11/09/19 1015 11/09/19 1021 11/09/19 1025 11/09/19 1100  BP: (!) 168/94 (!) 166/87  (!) 157/79  Pulse: 69 60 (!) 59 (!) 57  Resp: 18 19 (!) 21 (!) 21  Temp:    98.3 F (36.8 C)  TempSrc:    Oral  SpO2: 94% 90% 95%   Weight:        Intake/Output Summary (Last 24 hours) at 11/09/2019 1157 Last data filed at 11/09/2019 0616 Gross per 24 hour  Intake 240 ml  Output 1000 ml  Net -760 ml   Filed Weights   11/08/19 2245 11/09/19 0500  Weight: 96.8 kg 96.7 kg     Data Reviewed:   CBC: Recent Labs  Lab 11/06/19 1142 11/08/19 1306 11/09/19 0021 11/09/19 0324  WBC 4.9 4.4 4.2 4.2  HGB 10.9* 11.3* 10.6* 10.7*  HCT 34.9* 37.4 34.6* 34.7*  MCV 74.3* 75.6* 73.9* 73.7*  PLT 292 276 265 0000000   Basic Metabolic Panel: Recent Labs  Lab 11/06/19 1142 11/08/19 1306 11/09/19 0324  NA 136 136 140  K 3.8 4.4 3.5  CL 99 96* 102  CO2 27 28 31   GLUCOSE 117* 195* 99  BUN 22 16 14   CREATININE 1.16* 1.05* 0.94  CALCIUM 9.4 9.4 9.0   GFR: Estimated Creatinine Clearance: 58.4 mL/min (by C-G formula based on SCr of 0.94 mg/dL). Liver Function Tests: No results for input(s): AST, ALT, ALKPHOS, BILITOT, PROT, ALBUMIN in the last 168 hours. No results for input(s): LIPASE, AMYLASE in the last 168 hours. No results for input(s): AMMONIA in the last 168 hours. Coagulation Profile: Recent Labs  Lab 11/06/19 1142  INR 1.1   Cardiac Enzymes: No results for input(s): CKTOTAL, CKMB, CKMBINDEX, TROPONINI in the last 168 hours. BNP (last 3 results) No results for input(s): PROBNP in the last 8760 hours. HbA1C: No  results for input(s): HGBA1C in the last 72 hours. CBG: Recent Labs  Lab 11/08/19 2001 11/08/19 2119 11/09/19 0751 11/09/19 0853  GLUCAP 74 156* 47* 114*   Lipid Profile: Recent Labs    11/09/19 0324  CHOL 142  HDL 64  LDLCALC 58  TRIG 102  CHOLHDL 2.2   Thyroid Function Tests: Recent Labs    11/09/19 0021  TSH 1.500   Anemia Panel: No results for input(s): VITAMINB12, FOLATE, FERRITIN, TIBC, IRON, RETICCTPCT in the last 72 hours. Sepsis Labs: No results for input(s): PROCALCITON, LATICACIDVEN in the last 168 hours.  Recent Results (from the past 240 hour(s))  SARS CORONAVIRUS 2 (TAT 6-24 HRS) Nasopharyngeal Nasopharyngeal Swab     Status: None   Collection Time: 11/06/19 12:24 PM   Specimen: Nasopharyngeal Swab  Result Value Ref Range Status   SARS Coronavirus 2 NEGATIVE NEGATIVE Final    Comment: (NOTE) SARS-CoV-2 target nucleic acids are NOT DETECTED. The SARS-CoV-2 RNA is generally detectable in upper and lower respiratory specimens during the acute phase of infection. Negative results do not preclude SARS-CoV-2 infection, do not rule out co-infections with other pathogens, and should  not be used as the sole basis for treatment or other patient management decisions. Negative results must be combined with clinical observations, patient history, and epidemiological information. The expected result is Negative. Fact Sheet for Patients: SugarRoll.be Fact Sheet for Healthcare Providers: https://www.woods-mathews.com/ This test is not yet approved or cleared by the [place] FDA and  has been authorized for detection and/or diagnosis of SARS-CoV-2 by FDA under an Emergency Use Authorization (EUA). This EUA will remain  in effect (meaning this test can be used) for the duration of the COVID-19 declaration under Section 56 4(b)(1) of the Act, 21 U.S.C. section 360bbb-3(b)(1), unless the authorization is terminated  or revoked sooner. Performed at [facility], [place] [address] [place], [place] [address]   SARS CORONAVIRUS 2 (TAT 6-24 HRS) Nasopharyngeal Nasopharyngeal Swab     Status: None   Collection Time: 11/08/19  5:19 PM   Specimen: Nasopharyngeal Swab  Result Value Ref Range Status   SARS Coronavirus 2 NEGATIVE NEGATIVE Final    Comment: (NOTE) SARS-CoV-2 target nucleic acids are NOT DETECTED. The SARS-CoV-2 RNA is generally detectable in upper and lower respiratory specimens during the acute phase of infection. Negative results do not preclude SARS-CoV-2 infection, do not rule out co-infections with other pathogens, and should not be used as the sole basis for treatment or other patient management decisions. Negative results must be combined with clinical observations, patient history, and epidemiological information. The expected result is Negative. Fact Sheet for Patients: SugarRoll.be Fact Sheet for Healthcare Providers: https://www.woods-mathews.com/ This test is not yet approved or cleared by the [place] FDA and  has been authorized for detection and/or diagnosis of SARS-CoV-2 by FDA under an Emergency Use Authorization (EUA). This EUA will remain  in effect (meaning this test can be used) for the duration of the COVID-19 declaration under Section 56 4(b)(1) of the Act, 21 U.S.C. section 360bbb-3(b)(1), unless the authorization is terminated or revoked sooner. Performed at [facility], [place] [address] [place], [place] [address]          Radiology Studies: CARDIAC CATHETERIZATION  Result Date: 11/09/2019 1. Successful deployment of Cardiomems pressure sensor. 2. Normal RA pressure and PCWP. 3. Moderate pulmonary hypertension, PVR not significantly elevated.  Think PH is due to high cardiac output. 4. Despite high calculated cardiac output, no evidence for significant intracardiac shunt lesion.        Scheduled  Meds: . atorvastatin  10 mg Oral QPM  . carvedilol  25 mg Oral BID WC  . [START ON 11/10/2019] enoxaparin (LOVENOX) injection  40 mg Subcutaneous Q24H  . hydrALAZINE  100 mg Oral BID  . insulin aspart  0-15 Units Subcutaneous TID WC  . insulin aspart  0-5 Units Subcutaneous QHS  . insulin aspart  6 Units Subcutaneous TID WC  . insulin glargine  36 Units Subcutaneous QHS  . isosorbide mononitrate  60 mg Oral Daily  . lamoTRIgine  150 mg Oral BID  . levETIRAcetam  500 mg Oral BID  . lipase/protease/amylase  36,000 Units Oral Q24H  . lipase/protease/amylase   Oral BID WC  . sertraline  75 mg Oral Daily  . sodium chloride flush  3 mL Intravenous Once  . sodium chloride flush  3 mL Intravenous Q12H  . sodium chloride flush  3 mL Intravenous Q12H  . torsemide  40 mg Oral Daily   Continuous Infusions: . sodium chloride       LOS: 0 days   Time spent= 35 mins  Curties Conigliaro Arsenio Loader, MD [facility]  If 7PM-7AM, please contact night-coverage  11/09/2019, 11:57 AM

## 2019-11-09 NOTE — Plan of Care (Signed)
  Problem: Education: Goal: Knowledge of General Education information will improve Description: Including pain rating scale, medication(s)/side effects and non-pharmacologic comfort measures Outcome: Progressing   Problem: Activity: Goal: Risk for activity intolerance will decrease Outcome: Progressing   Problem: Nutrition: Goal: Adequate nutrition will be maintained Outcome: Progressing   Problem: Coping: Goal: Level of anxiety will decrease Outcome: Progressing   Problem: Pain Managment: Goal: General experience of comfort will improve Outcome: Progressing   Problem: Safety: Goal: Ability to remain free from injury will improve Outcome: Progressing   

## 2019-11-09 NOTE — Consult Note (Addendum)
Neurology Consultation  Reason for Consult: Seizure Referring Physician: Damita Lack, MD  CC: Seizure while in hospital  History is obtained from: Patient and chart  HPI: [name] is a 74 y.o. female with significant past medical history of seizures along with psychogenic nonepileptic seizures, peripheral neuropathy, neuroleptic induced Parkinson's, hyperlipidemia, hypertension, diabetes, depression.  In talking to family in room patient's last seizure was about 2 months ago.  She has been taking her meds.  Today she was obtaining a ultrasound as she was admitted for a syncopal episode.  Today patient went under a right heart catheterization.  Post catheterization patient was getting an echocardiogram when technician noted patient started rhythmically moving her hands.  Nurse was called into the room and noted patient's eyes were shut and try was moving side to side.  Patient was not responsive to verbal stimuli.  Patient does recall having an ultrasound but does not recall anything after this.  Currently she is alert and oriented.  She is having nausea.  She is also stating that her back hurts.  In talking further with family member at bedside she does admit that stress does bring on her seizures.  ED course  Relevant labs include -urinalysis negative, BMP with no abnormal electrolytes.  Chart review-this is a patient of Dr. Delice Lesch for seizures.  Currently she is on 500 mg Keppra twice daily along with Lamictal 150 mg twice daily.  Patient has been previously seen at [place].  Per Dr. Amparo Bristol note family describes some seizures as episodes of staring off and whole body jerking with foaming at the mouth.  She was monitored at [facility] with 3 events were captured.  One epileptic event localized to the left anterior temporal region.  She had 2 additional events with abnormal facial and hand movements without clear EEG changes, with multiple pushbutton events with no EEG  correlate.    Past Medical History:  Diagnosis Date  . Abnormal liver function     in the past.  . Anemia   . Arthritis    all over  . Bruises easily   . Cataract    right eye;immature  . Chronic back pain    stenosis  . Chronic cough   . Chronic kidney disease   . COPD (chronic obstructive pulmonary disease) ([place])   . Demand myocardial infarction [facility]) 2012   Demand Infarction in setting of Pancreatitis --> mild Troponin elevation, NON-OBSTRUCTIVE CAD  . Depression    takes Abilify daily as well as Zoloft  . Diastolic heart failure [id number]   Grade 1 diastolic Dysfunction by Echo   . Diverticulosis   . DM (diabetes mellitus) ([place])    takes Victoza daily as well as Lantus and Humalog  . Empty sella ([place])    on MRI in 2009.  . Glaucoma   . Headache(784.0)    last migraine-4-75yrs ago  . History of blood transfusion    no abnormal reaction noted  . History of colon polyps    benign  . HTN (hypertension)    takes [employer] daily  . Hyperlipidemia    takes Lipitor daily  . Joint swelling   . Nocturia   . Obesity hypoventilation syndrome ([place])   . Obstructive sleep apnea 02/2018   Notably improved split-night study with weight loss from 270 (2017) down to 250 pounds (2019).  . Pancreatitis    takes Pancrelipase daily  . Parkinson's disease ([place])    takes Sinemet daily  .  Peripheral neuropathy   . Pneumonia 2012  . Seizures ([place])    takes Lamictal daily and Primidone nightly;last seizure 2wks ago  . Urinary urgency    With increased frequency  . Varicose veins of both lower extremities with pain    With edema.  Takes daily Lasix    Family History  Problem Relation Age of Onset  . Allergies Father   . Heart disease Father        before 43  . Heart failure Father   . Hypertension Father   . Hyperlipidemia Father   . Heart disease Mother   . Diabetes Mother   . Hypertension Mother   . Hyperlipidemia Mother   . Hypertension Sister   . Heart  disease Sister        before 50  . Hyperlipidemia Sister   . Diabetes Brother   . Hypertension Brother   . Diabetes Sister   . Hypertension Sister   . Diabetes Son   . Hypertension Son   . Cancer Other     Social History:   reports that she has quit smoking. Her smoking use included cigarettes. She has a 12.50 pack-year smoking history. She has never used smokeless tobacco. She reports that she does not drink alcohol or use drugs.  Medications  Current Facility-Administered Medications:  .  0.9 %  sodium chloride infusion, 250 mL, Intravenous, PRN, Larey Dresser, MD .  acetaminophen (TYLENOL) tablet 650 mg, 650 mg, Oral, Q6H PRN **OR** acetaminophen (TYLENOL) suppository 650 mg, 650 mg, Rectal, Q6H PRN, Mujtaba, Mohammadtokir, MD .  acetaminophen (TYLENOL) tablet 500-1,000 mg, 500-1,000 mg, Oral, Q4H PRN, Mujtaba, Mohammadtokir, MD, 500 mg at 11/09/19 1133 .  acetaminophen (TYLENOL) tablet 650 mg, 650 mg, Oral, Q4H PRN, Larey Dresser, MD .  atorvastatin (LIPITOR) tablet 10 mg, 10 mg, Oral, QPM, Mujtaba, Mohammadtokir, MD .  carvedilol (COREG) tablet 25 mg, 25 mg, Oral, BID WC, Mujtaba, Mohammadtokir, MD, 25 mg at 11/09/19 0823 .  [START ON 11/10/2019] enoxaparin (LOVENOX) injection 40 mg, 40 mg, Subcutaneous, Q24H, McLean, Dalton S, MD .  hydrALAZINE (APRESOLINE) injection 10 mg, 10 mg, Intravenous, Q20 Min PRN, Larey Dresser, MD .  hydrALAZINE (APRESOLINE) tablet 100 mg, 100 mg, Oral, BID, Mujtaba, Mohammadtokir, MD, 100 mg at 11/09/19 0817 .  insulin aspart (novoLOG) injection 0-15 Units, 0-15 Units, Subcutaneous, TID WC, Mujtaba, Mohammadtokir, MD .  insulin aspart (novoLOG) injection 0-5 Units, 0-5 Units, Subcutaneous, QHS, Mujtaba, Mohammadtokir, MD .  isosorbide mononitrate (IMDUR) 24 hr tablet 60 mg, 60 mg, Oral, Daily, Mujtaba, Mohammadtokir, MD, 60 mg at 11/09/19 0819 .  labetalol (NORMODYNE) injection 10 mg, 10 mg, Intravenous, Q10 min PRN, Larey Dresser, MD .   lamoTRIgine (LAMICTAL) tablet 150 mg, 150 mg, Oral, BID, Mujtaba, Mohammadtokir, MD, 150 mg at 11/09/19 0818 .  levETIRAcetam (KEPPRA) tablet 500 mg, 500 mg, Oral, BID, Mujtaba, Mohammadtokir, MD, 500 mg at 11/09/19 0818 .  lipase/protease/amylase (CREON) capsule 36,000 Units, 36,000 Units, Oral, Q24H, Mujtaba, Mohammadtokir, MD .  lipase/protease/amylase (CREON) capsule, , Oral, BID WC, Mujtaba, Mohammadtokir, MD .  ondansetron (ZOFRAN) tablet 4 mg, 4 mg, Oral, Q6H PRN **OR** ondansetron (ZOFRAN) injection 4 mg, 4 mg, Intravenous, Q6H PRN, Mujtaba, Mohammadtokir, MD .  ondansetron (ZOFRAN) injection 4 mg, 4 mg, Intravenous, Q6H PRN, Larey Dresser, MD .  polyethylene glycol (MIRALAX / GLYCOLAX) packet 17 g, 17 g, Oral, Daily PRN, Amin, Ankit Chirag, MD .  senna-docusate (Senokot-S) tablet 2 tablet, 2 tablet, Oral,  QHS PRN, Amin, Ankit Chirag, MD .  sertraline (ZOLOFT) tablet 75 mg, 75 mg, Oral, Daily, Mujtaba, Mohammadtokir, MD, 75 mg at 11/09/19 0819 .  sodium chloride flush (NS) 0.9 % injection 3 mL, 3 mL, Intravenous, Once, Mujtaba, Mohammadtokir, MD .  sodium chloride flush (NS) 0.9 % injection 3 mL, 3 mL, Intravenous, Q12H, Mujtaba, Mohammadtokir, MD, 3 mL at 11/09/19 0829 .  sodium chloride flush (NS) 0.9 % injection 3 mL, 3 mL, Intravenous, Q12H, [name], Dalton S, MD .  sodium chloride flush (NS) 0.9 % injection 3 mL, 3 mL, Intravenous, PRN, Larey Dresser, MD .  torsemide [facility]) tablet 40 mg, 40 mg, Oral, Daily, Mujtaba, Mohammadtokir, MD, 40 mg at 11/09/19 1103  ROS:   General ROS: negative for - chills, fatigue, fever, night sweats, weight gain or weight loss Psychological ROS: negative for - behavioral disorder, hallucinations, memory difficulties, mood swings or suicidal ideation Ophthalmic ROS: negative for - blurry vision, double vision, eye pain or loss of vision ENT ROS: negative for - epistaxis, nasal discharge, oral lesions, sore throat, tinnitus or vertigo Allergy and  Immunology ROS: negative for - hives or itchy/watery eyes Hematological and Lymphatic ROS: negative for - bleeding problems, bruising or swollen lymph nodes Endocrine ROS: negative for - galactorrhea, hair pattern changes, polydipsia/polyuria or temperature intolerance Respiratory ROS: negative for - cough, hemoptysis, shortness of breath or wheezing Cardiovascular ROS: negative for - chest pain, dyspnea on exertion, edema or irregular heartbeat Gastrointestinal ROS: Positive for -  nausea/vomiting Genito-Urinary ROS: negative for - dysuria, hematuria, incontinence or urinary frequency/urgency Musculoskeletal ROS: Positive for -back pain Neurological ROS: as noted in HPI Dermatological ROS: negative for rash and skin lesion changes  Exam: Current vital signs: BP (!) 154/76 (BP Location: Right Arm)   Pulse (!) 58   Temp 98.3 F (36.8 C) (Oral)   Resp 20   Wt 96.7 kg   SpO2 94%   BMI 40.28 kg/m  Vital signs in last 24 hours: Temp:  [98.2 F (36.8 C)-98.5 F (36.9 C)] 98.3 F (36.8 C) (05/17 1100) Pulse Rate:  [54-69] 58 (05/17 1200) Resp:  [12-24] 20 (05/17 1200) BP: (101-184)/(53-98) 154/76 (05/17 1200) SpO2:  [88 %-99 %] 94 % (05/17 1200) Weight:  [96.7 kg-96.8 kg] 96.7 kg (05/17 0500)   Constitutional: Appears well-developed and well-nourished.  Psych: Currently in discomfort secondary to nausea Eyes: No scleral injection HENT: No OP obstrucion Head: Normocephalic.  Cardiovascular: Normal rate and regular rhythm.  Respiratory: Effort normal, non-labored breathing GI: Soft.  No distension. There is no tenderness.  Skin: WDI, right leg catheter site is soft with no hematoma or blood noted on gauze. Neuro: Mental Status: Patient is awake, alert, oriented to person, place, month, year, and situation.  Speech shows no dysarthria or aphasia.  Naming and repeating along with comprehension intact.  Patient unable to give a full history secondary to not recalling event. Cranial  Nerves: II: Visual Fields are full.  III,IV, VI: EOMI without ptosis or diploplia. Pupils equal, round and reactive to light V: Facial sensation is symmetric to temperature VII: Facial movement is symmetric.  VIII: hearing is intact to voice XI: Shoulder shrug is symmetric. Motor: Moving bilateral upper extremities antigravity with left arm slightly weaker than right.  Was not able to move right leg secondary to recent catheter placement for heart cath.  Left leg was able to lift off bed about 2 inches but this was limited secondary to pain Sensory: normal to LT all over  Labs I have reviewed labs in epic and the results pertinent to this consultation are:  CBC    Component Value Date/Time   WBC 4.2 11/09/2019 0324   RBC 4.71 11/09/2019 0324   HGB 10.7 (L) 11/09/2019 0324   HGB 11.4 04/03/2019 1433   HCT 34.7 (L) 11/09/2019 0324   HCT 36.6 04/03/2019 1433   PLT 261 11/09/2019 0324   PLT 310 04/03/2019 1433   MCV 73.7 (L) 11/09/2019 0324   MCV 74 (L) 04/03/2019 1433   MCH 22.7 (L) 11/09/2019 0324   MCHC 30.8 11/09/2019 0324   RDW 16.6 (H) 11/09/2019 0324   RDW 14.7 04/03/2019 1433   LYMPHSABS 1.4 02/25/2019 0332   MONOABS 0.6 02/25/2019 0332   EOSABS 0.1 02/25/2019 0332   BASOSABS 0.0 02/25/2019 0332    CMP     Component Value Date/Time   NA 140 11/09/2019 0324   NA 141 08/24/2019 1530   K 3.5 11/09/2019 0324   CL 102 11/09/2019 0324   CO2 31 11/09/2019 0324   GLUCOSE 99 11/09/2019 0324   BUN 14 11/09/2019 0324   BUN 21 08/24/2019 1530   CREATININE 0.83 11/09/2019 1133   CREATININE 0.91 10/04/2016 1144   CALCIUM 9.0 11/09/2019 0324   PROT 6.8 02/25/2019 0332   PROT 7.4 10/22/2018 1446   ALBUMIN 3.3 (L) 02/25/2019 0332   ALBUMIN 4.3 10/22/2018 1446   AST 18 02/25/2019 0332   ALT 16 02/25/2019 0332   ALKPHOS 79 02/25/2019 0332   BILITOT 0.7 02/25/2019 0332   BILITOT 0.4 10/22/2018 1446   GFRNONAA >60 11/09/2019 1133   GFRNONAA 65 10/04/2016 1144   GFRAA  >60 11/09/2019 1133   GFRAA 75 10/04/2016 1144    Lipid Panel     Component Value Date/Time   CHOL 142 11/09/2019 0324   CHOL 145 10/22/2018 1446   TRIG 102 11/09/2019 0324   HDL 64 11/09/2019 0324   HDL 70 10/22/2018 1446   CHOLHDL 2.2 11/09/2019 0324   VLDL 20 11/09/2019 0324   LDLCALC 58 11/09/2019 0324   LDLCALC 65 10/22/2018 1446   LDLDIRECT 66.0 01/27/2015 1104     Imaging I have reviewed the images obtained: CT-scan of the brain --no evidence of acute intracranial abnormality.  Redemonstrated small chronic cortical based posterior right frontal lobe infarct.  Mild chronic small vessel ischemic disease.  Etta Quill PA-C [facility] [phone]  M-F  (9:00 am- 5:00 PM)  11/09/2019, 2:03 PM   Assessment:  This is a 74 year old female who has history of seizures and nonepileptic seizures who currently is on Lamictal and Keppra.  While patient was obtaining ultrasound today she was noted to have seizure-like activity.  Currently nauseated and having back pain which is limiting formal musculoskeletal exam.  She is alert and oriented.  She does have a history of seizures when under stress.  Currently negative UA and no significant electrolyte abnormalities to provoke seizure.  As she does have seizures in the past will obtain EEG.  Imaging would also be helpful to rule out any cardioembolic strokes which might have triggered a seizure.  Left-sided weakness after the event makes Todd's paralysis more likely. Possibility of this event being nonepileptic also remains.  Impression: -Seizure-like activity in a patient with known seizures and nonepileptic spells-which were also referred to as pseudoseizures. -Evaluate for stroke  Recommendations: -Seizure precautions -MRI of brain without contrast, I have discussed this with cardiology however patient does have an implant which they will further investigate  prior to MRI -EEG -We will make no changes at this time with  antiepileptic medications unless EEG shows abnormality which needs to be treated. -Per [facility] statutes, patients with seizures are not allowed to drive until  they have been seizure-free for six months. Use caution when using heavy equipment or power tools. Avoid working on ladders or at heights. Take showers instead of baths. Ensure the water temperature is not too high on the home water heater. Do not go swimming alone. When caring for infants or small children, sit down when holding, feeding, or changing them to minimize risk of injury to the child in the event you have a seizure.   Also, Maintain good sleep hygiene. Avoid alcohol.   Attending Neurohospitalist Addendum Patient seen and examined with APP/Resident. Agree with the history and physical as documented above. Agree with the plan as documented, which I helped formulate. I have independently reviewed the chart, obtained history, review of systems and examined the patient.I have personally reviewed pertinent head/neck/spine imaging (CT/MRI). Please feel free to call with any questions. --- Amie Portland, MD [facility] Pager: [phone]  If 7pm to 7am, please call on call as listed on AMION.

## 2019-11-09 NOTE — Progress Notes (Signed)
Echocardiogram 2D Echocardiogram has been performed.  Oneal Deputy Chele Cornell 11/09/2019, 12:26 PM   Patient had a seizure during exam, rapid response was called.

## 2019-11-09 NOTE — Procedures (Signed)
Patient Name: DESJA BORO  MRN: [id number]  Epilepsy Attending: Lora Havens  Referring Physician/Provider: Etta Quill, PA Date: 11/09/2019  Duration: 24.11 mins  Patient history: This is a 74 year old female who has history of seizures and nonepileptic seizures who currently is on Lamictal and Keppra.  While patient was obtaining ultrasound today she was noted to have seizure-like activity. EEG to evaluate for seizure    Level of alertness: Awake  AEDs during EEG study: Keppra, LTG  Technical aspects: This EEG study was done with scalp electrodes positioned according to the 10-20 International system of electrode placement. Electrical activity was acquired at a sampling rate of 500Hz  and reviewed with a high frequency filter of 70Hz  and a low frequency filter of 1Hz . EEG data were recorded continuously and digitally stored.   Description: The posterior dominant rhythm consists of 9 Hz activity of moderate voltage (25-35 uV) seen predominantly in posterior head regions, symmetric and reactive to eye opening and eye closing.  Hyperventilation and photic stimulation were not performed.      IMPRESSION: This study is within normal limits. No seizures or epileptiform discharges were seen throughout the recording.  Manjit Bufano Barbra Sarks

## 2019-11-09 NOTE — Progress Notes (Signed)
EEG complete - results pending 

## 2019-11-09 NOTE — Interval H&P Note (Signed)
History and Physical Interval Note:  11/09/2019 9:15 AM  [name]  has presented today for surgery, with the diagnosis of chf.  The various methods of treatment have been discussed with the patient and family. After consideration of risks, benefits and other options for treatment, the patient has consented to  Procedure(s): PRESSURE SENSOR/CARDIOMEMS (N/A) as a surgical intervention.  The patient's history has been reviewed, patient examined, no change in status, stable for surgery.  I have reviewed the patient's chart and labs.  Questions were answered to the patient's satisfaction.     Bach Rocchi [employer]

## 2019-11-10 ENCOUNTER — Encounter: Payer: Self-pay | Admitting: General Practice

## 2019-11-10 ENCOUNTER — Inpatient Hospital Stay (HOSPITAL_COMMUNITY): Payer: Medicare HMO

## 2019-11-10 DIAGNOSIS — I110 Hypertensive heart disease with heart failure: Secondary | ICD-10-CM

## 2019-11-10 LAB — BASIC METABOLIC PANEL
Anion gap: 10 (ref 5–15)
BUN: 12 mg/dL (ref 8–23)
CO2: 29 mmol/L (ref 22–32)
Calcium: 8.9 mg/dL (ref 8.9–10.3)
Chloride: 97 mmol/L — ABNORMAL LOW (ref 98–111)
Creatinine, Ser: 1.03 mg/dL — ABNORMAL HIGH (ref 0.44–1.00)
GFR calc Af Amer: >60 mL/min (ref >60)
GFR calc non Af Amer: 55 mL/min — ABNORMAL LOW (ref >60)
Glucose, Bld: 120 mg/dL — ABNORMAL HIGH (ref 70–99)
Potassium: 3.5 mmol/L (ref 3.5–5.1)
Sodium: 136 mmol/L (ref 135–145)

## 2019-11-10 LAB — GLUCOSE, CAPILLARY
Glucose-Capillary: 52 mg/dL — ABNORMAL LOW (ref 70–99)
Glucose-Capillary: 102 mg/dL — ABNORMAL HIGH (ref 70–99)
Glucose-Capillary: 156 mg/dL — ABNORMAL HIGH (ref 70–99)
Glucose-Capillary: 161 mg/dL — ABNORMAL HIGH (ref 70–99)

## 2019-11-10 LAB — CBC
HCT: 36.6 % (ref 36.0–46.0)
Hemoglobin: 11.5 g/dL — ABNORMAL LOW (ref 12.0–15.0)
MCH: 23.1 pg — ABNORMAL LOW (ref 26.0–34.0)
MCHC: 31.4 g/dL (ref 30.0–36.0)
MCV: 73.5 fL — ABNORMAL LOW (ref 80.0–100.0)
Platelets: 293 10*3/uL (ref 150–400)
RBC: 4.98 MIL/uL (ref 3.87–5.11)
RDW: 16.5 % — ABNORMAL HIGH (ref 11.5–15.5)
WBC: 5.2 10*3/uL (ref 4.0–10.5)
nRBC: 0 % (ref 0.0–0.2)

## 2019-11-10 LAB — MAGNESIUM: Magnesium: 2.1 mg/dL (ref 1.7–2.4)

## 2019-11-10 MED ORDER — ASPIRIN 81 MG PO CHEW
81.0000 mg | CHEWABLE_TABLET | Freq: Every day | ORAL | Status: DC
  Administered 2019-11-10 – 2019-11-11 (×2): 81 mg via ORAL
  Filled 2019-11-10 (×2): qty 1

## 2019-11-10 MED ORDER — POTASSIUM CHLORIDE CRYS ER 20 MEQ PO TBCR
40.0000 meq | EXTENDED_RELEASE_TABLET | Freq: Once | ORAL | Status: AC
  Administered 2019-11-10: 40 meq via ORAL
  Filled 2019-11-10: qty 2

## 2019-11-10 MED ORDER — DEXTROSE 50 % IV SOLN: 1.0000 | Freq: Once | INTRAVENOUS | Status: DC

## 2019-11-10 NOTE — Plan of Care (Signed)

## 2019-11-10 NOTE — Progress Notes (Signed)
NEUROLOGY PROGRESS NOTE   Subjective: Patient complaining of left knee pain.  She believes it is her arthritis.  No other complaints at this time  Exam: Vitals:   11/10/19 0817 11/10/19 1124  BP: 121/67 132/69  Pulse: 68 81  Resp: 18 20  Temp: 98.1 F (36.7 C) 97.9 F (36.6 C)  SpO2: 96% 95%    Neuro:  Mental Status: Alert, oriented, thought content appropriate.  Speech fluent without evidence of aphasia.  Able to follow 3 step commands without difficulty. Cranial Nerves: II:  Visual fields grossly normal,  III,IV, VI: ptosis not present, extra-ocular motions intact bilaterally pupils equal, round, reactive to light and accommodation V,VII: smile symmetric, facial light touch sensation normal bilaterally VIII: hearing normal bilaterally Motor: Bilateral upper extremity 4/5.  Right lower extremity 5/5.  Left lower extremity shows hip flexion 5/5, knee flexion 5/5, knee extension 5/5, dorsiflexion plantarflexion 5/5.  Still having some difficulty lifting her left leg however she states this is due to back pain.   Medications:  Scheduled: . aspirin  81 mg Oral Daily  . atorvastatin  10 mg Oral QPM  . carvedilol  25 mg Oral BID WC  . dextrose  1 ampule Intravenous Once  . enoxaparin (LOVENOX) injection  40 mg Subcutaneous Q24H  . hydrALAZINE  100 mg Oral BID  . insulin aspart  0-15 Units Subcutaneous TID WC  . insulin aspart  0-5 Units Subcutaneous QHS  . isosorbide mononitrate  60 mg Oral Daily  . lamoTRIgine  150 mg Oral BID  . levETIRAcetam  500 mg Oral BID  . lipase/protease/amylase  36,000 Units Oral Q24H  . lipase/protease/amylase   Oral BID WC  . sertraline  75 mg Oral Daily  . sodium chloride flush  3 mL Intravenous Once  . sodium chloride flush  3 mL Intravenous Q12H  . sodium chloride flush  3 mL Intravenous Q12H  . torsemide  40 mg Oral Daily   Continuous: . sodium chloride      Pertinent Labs/Diagnostics: EEG  Result Date: 11/09/2019   IMPRESSION:  This study is within normal limits. No seizures or epileptiform discharges were seen throughout the recording. Lora Havens   MR BRAIN WO CONTRAST  Result Date: 11/10/2019  IMPRESSION: No change since September 2020. No acute finding. Mild to moderate chronic small-vessel ischemic change of the white matter. Old small right posterior frontal cortical and subcortical infarction. Electronically Signed   By: Nelson Chimes M.D.   On: 11/10/2019 09:26   Etta Quill PA-C [facility] [phone]  Assessment:  This is a patient who yesterday neurology was consulted for possible seizure activity versus nonepileptic seizures.  At this time she is awake alert and oriented.  MRI of her brain was negative, EEG was negative for any epileptiform activity.  Neurology was also called back for complaints of left leg weakness.  On exam patient's complaint is that her left knee hurts.  Exam does show that she has 5/5 strength.  She does have 4/5 strength with straight leg raise secondary to pain.  Impression: -Seizure versus nonepileptic seizure however EEG was negative for any epileptiform activity. -Left leg weakness/pain.-Full strength on exam likely secondary to knee arthritis causing pain.  Recommendations:  -Physical therapy -No changes in antiepileptic medications. Per [facility] statutes, patients with seizures are not allowed to drive until  they have been seizure-free for six months. Use caution when using heavy equipment or power tools. Avoid working on ladders or at heights.  Take showers instead of baths. Ensure the water temperature is not too high on the home water heater. Do not go swimming alone. When caring for infants or small children, sit down when holding, feeding, or changing them to minimize risk of injury to the child in the event you have a seizure.   Also, Maintain good sleep hygiene. Avoid alcohol.     11/10/2019, 2:49 PM

## 2019-11-10 NOTE — Evaluation (Signed)
Physical Therapy Evaluation Patient Details Name: [name] MRN: [id number] DOB: 03/20/1946 Today's Date: 11/10/2019   History of Present Illness  74 y.o. female with  HTN, DM, COPD, OHS/OSA chronic HFpEF, epilepsy, pancreatic insufficiency who presented with syncope. Admitted for recurring syncope 11/08/19 Experienced 5 min seizure during ECHO. EEG, CT head, MRI brain-negative for any acute pathology  Clinical Impression  PTA pt living with sister and brother in-law in single story home with 3 steps to enter. Pt reports not using AD to mobilize in home and cane or Rollator to ambulate in community. Pt is independent in ADLs, family provides for some iADLs. Pt is currently limited in safe mobility by syncope/seizures, painful knees and decreased strength and balance. Pt is currently min guard for bed mobility and transfers and min A for ambulation without AD as she relies on sink for support. Pt is min guard for ambulation with RW. PT recommending HHPT at discharge to improve strength and balance for safe mobility in her home environment. PT will continue to follow acutely.     Follow Up Recommendations Home health PT;Supervision/Assistance - 24 hour    Equipment Recommendations  None recommended by PT    Recommendations for Other Services OT consult     Precautions / Restrictions Precautions Precautions: Fall Precaution Comments: seizures Restrictions Weight Bearing Restrictions: No      Mobility  Bed Mobility Overal bed mobility: Needs Assistance Bed Mobility: Supine to Sit     Supine to sit: Min guard     General bed mobility comments: min guard for safety, HoB elevated and use of bed rail to pull to EoB  Transfers Overall transfer level: Needs assistance Equipment used: None Transfers: Sit to/from Stand Sit to Stand: Min guard         General transfer comment: min guard with increase LE posterior support on the bed to steady, min guard and heavy use of grab  bars to come to standing at low toilet  Ambulation/Gait Ambulation/Gait assistance: Min assist;Min guard Gait Distance (Feet): 12 Feet(2x12) Assistive device: None;Rolling walker (2 wheeled) Gait Pattern/deviations: Step-through pattern;Decreased step length - right;Decreased step length - left;Shuffle;Antalgic Gait velocity: slowed Gait velocity interpretation: <1.8 ft/sec, indicate of risk for recurrent falls General Gait Details: minA for steadying as pt utilized sink for steadying with ambulation to bathroom, slowed waddling antalgic gait due to L knee pain, min guard for safety to ambulate back to bed with RW, vc for advancing Rw      Balance Overall balance assessment: Needs assistance Sitting-balance support: Feet supported;No upper extremity supported Sitting balance-Leahy Scale: Good     Standing balance support: No upper extremity supported;Single extremity supported;During functional activity Standing balance-Leahy Scale: Fair Standing balance comment: requires support of LE on bed posteriorly to achieve initial standing balance                             Pertinent Vitals/Pain Pain Assessment: 0-10 Pain Score: 7  Pain Location: L knee  Pain Descriptors / Indicators: Sharp;Shooting Pain Intervention(s): Limited activity within patient's tolerance;Monitored during session;Repositioned    Home Living Family/patient expects to be discharged to:: Private residence Living Arrangements: Other relatives(sister and brother in-law) Available Help at Discharge: Family;Available 24 hours/day Type of Home: House Home Access: Stairs to enter Entrance Stairs-Rails: None Entrance Stairs-Number of Steps: 3 Home Layout: One level Home Equipment: Tub bench;Walker - standard;Bedside commode;Cane - single point      Prior Function  Level of Independence: Needs assistance   Gait / Transfers Assistance Needed: mostly uses furniture for support, limited Rollator and cane  use.   ADL's / Homemaking Assistance Needed: Does her own ADLs. Completes simple meal prep and laundry tasks           Extremity/Trunk Assessment   Upper Extremity Assessment Upper Extremity Assessment: Generalized weakness    Lower Extremity Assessment Lower Extremity Assessment: RLE deficits/detail;LLE deficits/detail RLE Deficits / Details: R arch collapse, R knee swelling lacking full flexion, painful, with movement. strength grossly assessed 3+/5 RLE Sensation: WNL RLE Coordination: decreased fine motor LLE Deficits / Details: L arch collapse, L knee swelling and pain with movement, strength grossly 3/5  LLE Sensation: WNL LLE Coordination: decreased fine motor    Cervical / Trunk Assessment Cervical / Trunk Assessment: Kyphotic  Communication   Communication: No difficulties  Cognition Arousal/Alertness: Awake/alert Behavior During Therapy: WFL for tasks assessed/performed Overall Cognitive Status: Within Functional Limits for tasks assessed                                        General Comments General comments (skin integrity, edema, etc.): VSS on RA        Assessment/Plan    PT Assessment Patient needs continued PT services  PT Problem List Decreased strength;Decreased range of motion;Decreased activity tolerance;Decreased balance;Decreased mobility;Decreased coordination;Decreased safety awareness;Pain;Obesity       PT Treatment Interventions DME instruction;Gait training;Stair training;Functional mobility training;Therapeutic activities;Therapeutic exercise;Balance training;Cognitive remediation;Patient/family education    PT Goals (Current goals can be found in the Care Plan section)  Acute Rehab PT Goals Patient Stated Goal: get back to walking for exercise PT Goal Formulation: With patient Time For Goal Achievement: 11/24/19 Potential to Achieve Goals: Good    Frequency Min 3X/week    AM-PAC PT "6 Clicks" Mobility  Outcome  Measure Help needed turning from your back to your side while in a flat bed without using bedrails?: None Help needed moving from lying on your back to sitting on the side of a flat bed without using bedrails?: A Little Help needed moving to and from a bed to a chair (including a wheelchair)?: A Little Help needed standing up from a chair using your arms (e.g., wheelchair or bedside chair)?: None Help needed to walk in hospital room?: A Little Help needed climbing 3-5 steps with a railing? : A Little 6 Click Score: 20    End of Session Equipment Utilized During Treatment: Gait belt Activity Tolerance: Patient tolerated treatment well Patient left: in bed;with call bell/phone within reach;with nursing/sitter in room;Other (comment)(Aide in room to assist in bathing) Nurse Communication: Mobility status PT Visit Diagnosis: Unsteadiness on feet (R26.81);Other abnormalities of gait and mobility (R26.89);Repeated falls (R29.6);Muscle weakness (generalized) (M62.81);History of falling (Z91.81);Difficulty in walking, not elsewhere classified (R26.2);Pain;Other symptoms and signs involving the nervous system (R29.898) Pain - Right/Left: Left Pain - part of body: Knee    Time: 1206-1255 PT Time Calculation (min) (ACUTE ONLY): 49 min   Charges:   PT Evaluation $PT Eval Moderate Complexity: 1 Mod PT Treatments $Gait Training: 8-22 mins $Therapeutic Activity: 8-22 mins        Riker Collier B. Migdalia Dk PT, DPT Acute Rehabilitation Services Pager [phone] Office [phone]   Ruch 11/10/2019, 1:28 PM

## 2019-11-10 NOTE — Progress Notes (Addendum)
PROGRESS NOTE    [name]  [id number] DOB: [birth year] DOA: 11/08/2019 PCP: [name], MD   Brief Narrative:  74 y.o. female with  HTN, DM, COPD, OHS/OSA chronic HFpEF, epilepsy, pancreatic insufficiency who presented with syncope.  Previously has undergone extensive cardiac work-up.  Cardiology team consulted, and CardioMEMs placement.  Hospital course complicated by seizure.  Cardiology team thinks her symptoms are secondary to seizures, underwent seizure evaluation which in the hospital was negative.  EEG, CT head, MRI brain-negative for any acute pathology.  Recommended continuing same seizure medications.   Assessment & Plan:   Active Problems:   Obesity hypoventilation syndrome (HCC)   Chronic pancreatitis (HCC)   Hypertensive heart disease with chronic diastolic congestive heart failure (HCC)   Morbid obesity with BMI of 45.0-49.9, adult (HCC)   Type II diabetes mellitus, uncontrolled ([place])   Seizure disorder ([place])   Syncope   Pulmonary hypertension, unspecified ([place])   Neuroleptic-induced Parkinsonism ([place])   Recurrent syncope-cardiac versus neuro -Suspect this is secondary to her seizures.  PT/OT ordered.  Recurrent seizures with left sided facial droop and upper extremity weakness CT head, MRI brain and EEG-negative for any acute pathology Neurology is following.  Recommending continuing Keppra and Lamictal Seizure precautions and neuro check  Diabetes mellitus type 2, uncontrolled.  Insulin-dependent Hypoglycemia Her long-acting insulin has been discontinued.  Had another hypoglycemic episode this morning.  Improved after breakfast.  Encourage oral diet.  Accu-Chek and sliding scale only at this time.  If necessary will start patient on D5.  Chronic diastolic CHF with right ventricular failure Currently appears to be euvolemic.  Continue Lipitor, Coreg, hydralazine, isosorbide mononitrate, torsemide 40 mg daily.  Obstructive sleep apnea -CPAP at  bedtime   DVT prophylaxis: Lovenox Code Status: Full code Family Communication: None at bedside  Dispo: The patient is from: Home              Anticipated d/c is to: Home              Anticipated d/c date is: 1 day              Patient currently is not medically stable to d/c.  Pending neurology clearance, will need PT/OT.  In the meantime ensuring blood glucose remained stable.  Still had not have a hypoglycemic episode this morning.  Maintain her hospital stay today. Hopefully dc tomorrow.   Subjective: This morning patient is much more alert and interactive.  Had a hypoglycemic episode this morning therefore amp of D50 ordered.  Tells me at home she uses a walker and a cane to get around.  Still having left upper and lower extremity mild weakness; she is not sure if this is chronic.  But to me this is slightly improved from yesterday.  Review of Systems Otherwise negative except as per HPI, including: General: Denies fever, chills, night sweats or unintended weight loss. Resp: Denies cough, wheezing, shortness of breath. Cardiac: Denies chest pain, palpitations, orthopnea, paroxysmal nocturnal dyspnea. GI: Denies abdominal pain, nausea, vomiting, diarrhea or constipation GU: Denies dysuria, frequency, hesitancy or incontinence MS: Denies muscle aches, joint pain or swelling Neuro: Denies headache, neurologic deficits (focal weakness, numbness, tingling), abnormal gait Psych: Denies anxiety, depression, SI/HI/AVH Skin: Denies new rashes or lesions ID: Denies sick contacts, exotic exposures, travel  Examination:  Constitutional: Not in acute distress Respiratory: Minimal bibasilar rhonchi. Cardiovascular: Normal sinus rhythm, no rubs Abdomen: Nontender nondistended good bowel sounds Musculoskeletal: Left upper and lower extremity strength 4+/5.  Right upper and lower semistrength 5/5. Skin: No rashes seen Neurologic: CN 2-12 grossly intact.  Psychiatric: Normal judgment and  insight. Alert and oriented x 3. Normal mood.  Objective: Vitals:   11/09/19 2100 11/09/19 2345 11/10/19 0441 11/10/19 0817  BP:  (!) 99/58 128/65 121/67  Pulse:  60 68 68  Resp:  17 18 18   Temp:  98.2 F (36.8 C) 98 F (36.7 C) 98.1 F (36.7 C)  TempSrc:  Oral Oral Oral  SpO2:  92% 93% 96%  Weight:   95.8 kg   Height: 5\' 1"  (1.549 m)       Intake/Output Summary (Last 24 hours) at 11/10/2019 1134 Last data filed at 11/09/2019 2100 Gross per 24 hour  Intake 240 ml  Output --  Net 240 ml   Filed Weights   11/08/19 2245 11/09/19 0500 11/10/19 0441  Weight: 96.8 kg 96.7 kg 95.8 kg     Data Reviewed:   CBC: Recent Labs  Lab 11/08/19 1306 11/08/19 1306 11/09/19 0021 11/09/19 0021 11/09/19 0324 11/09/19 0324 11/09/19 0935 11/09/19 1005 11/09/19 1006 11/09/19 1417 11/10/19 0402  WBC 4.4  --  4.2  --  4.2  --   --   --   --  5.7 5.2  HGB 11.3*   < > 10.6*   < > 10.7*   < > 12.2  12.2 11.2* 11.9* 12.0 11.5*  HCT 37.4   < > 34.6*   < > 34.7*   < > 36.0  36.0 33.0* 35.0* 39.2 36.6  MCV 75.6*  --  73.9*  --  73.7*  --   --   --   --  73.5* 73.5*  PLT 276  --  265  --  261  --   --   --   --  281 293   < > = values in this interval not displayed.   Basic Metabolic Panel: Recent Labs  Lab 11/06/19 1142 11/06/19 1142 11/08/19 1306 11/08/19 1306 11/09/19 0324 11/09/19 0935 11/09/19 1005 11/09/19 1006 11/09/19 1133 11/10/19 0402  NA 136   < > 136   < > 140 141  140 135 141  --  136  K 3.8   < > 4.4   < > 3.5 3.5  3.6 3.0* 3.6  --  3.5  CL 99  --  96*  --  102  --   --   --   --  97*  CO2 27  --  28  --  31  --   --   --   --  29  GLUCOSE 117*  --  195*  --  99  --   --   --   --  120*  BUN 22  --  16  --  14  --   --   --   --  12  CREATININE 1.16*  --  1.05*  --  0.94  --   --   --  0.83 1.03*  CALCIUM 9.4  --  9.4  --  9.0  --   --   --   --  8.9  MG  --   --   --   --   --   --   --   --   --  2.1   < > = values in this interval not displayed.    GFR: Estimated Creatinine Clearance: 53 mL/min (A) (by C-G formula based on SCr of 1.03 mg/dL (  H)). Liver Function Tests: No results for input(s): AST, ALT, ALKPHOS, BILITOT, PROT, ALBUMIN in the last 168 hours. No results for input(s): LIPASE, AMYLASE in the last 168 hours. No results for input(s): AMMONIA in the last 168 hours. Coagulation Profile: Recent Labs  Lab 11/06/19 1142  INR 1.1   Cardiac Enzymes: No results for input(s): CKTOTAL, CKMB, CKMBINDEX, TROPONINI in the last 168 hours. BNP (last 3 results) No results for input(s): PROBNP in the last 8760 hours. HbA1C: Recent Labs    11/09/19 0021  HGBA1C 7.6*   CBG: Recent Labs  Lab 11/09/19 1131 11/09/19 1203 11/09/19 1748 11/09/19 2058 11/10/19 0809  GLUCAP 54* 61* 123* 130* 52*   Lipid Profile: Recent Labs    11/09/19 0324  CHOL 142  HDL 64  LDLCALC 58  TRIG 102  CHOLHDL 2.2   Thyroid Function Tests: Recent Labs    11/09/19 0021  TSH 1.500   Anemia Panel: No results for input(s): VITAMINB12, FOLATE, FERRITIN, TIBC, IRON, RETICCTPCT in the last 72 hours. Sepsis Labs: No results for input(s): PROCALCITON, LATICACIDVEN in the last 168 hours.  Recent Results (from the past 240 hour(s))  SARS CORONAVIRUS 2 (TAT 6-24 HRS) Nasopharyngeal Nasopharyngeal Swab     Status: None   Collection Time: 11/06/19 12:24 PM   Specimen: Nasopharyngeal Swab  Result Value Ref Range Status   SARS Coronavirus 2 NEGATIVE NEGATIVE Final    Comment: (NOTE) SARS-CoV-2 target nucleic acids are NOT DETECTED. The SARS-CoV-2 RNA is generally detectable in upper and lower respiratory specimens during the acute phase of infection. Negative results do not preclude SARS-CoV-2 infection, do not rule out co-infections with other pathogens, and should not be used as the sole basis for treatment or other patient management decisions. Negative results must be combined with clinical observations, patient history, and  epidemiological information. The expected result is Negative. Fact Sheet for Patients: SugarRoll.be Fact Sheet for Healthcare Providers: https://www.woods-mathews.com/ This test is not yet approved or cleared by the [place] FDA and  has been authorized for detection and/or diagnosis of SARS-CoV-2 by FDA under an Emergency Use Authorization (EUA). This EUA will remain  in effect (meaning this test can be used) for the duration of the COVID-19 declaration under Section 56 4(b)(1) of the Act, 21 U.S.C. section 360bbb-3(b)(1), unless the authorization is terminated or revoked sooner. Performed at [facility], [place] [address] [place], [place] [address]   SARS CORONAVIRUS 2 (TAT 6-24 HRS) Nasopharyngeal Nasopharyngeal Swab     Status: None   Collection Time: 11/08/19  5:19 PM   Specimen: Nasopharyngeal Swab  Result Value Ref Range Status   SARS Coronavirus 2 NEGATIVE NEGATIVE Final    Comment: (NOTE) SARS-CoV-2 target nucleic acids are NOT DETECTED. The SARS-CoV-2 RNA is generally detectable in upper and lower respiratory specimens during the acute phase of infection. Negative results do not preclude SARS-CoV-2 infection, do not rule out co-infections with other pathogens, and should not be used as the sole basis for treatment or other patient management decisions. Negative results must be combined with clinical observations, patient history, and epidemiological information. The expected result is Negative. Fact Sheet for Patients: SugarRoll.be Fact Sheet for Healthcare Providers: https://www.woods-mathews.com/ This test is not yet approved or cleared by the [place] FDA and  has been authorized for detection and/or diagnosis of SARS-CoV-2 by FDA under an Emergency Use Authorization (EUA). This EUA will remain  in effect (meaning this test can be used) for the duration of the COVID-19  declaration under Section 56 4(b)(1) of the Act, 21 U.S.C. section 360bbb-3(b)(1), unless the authorization is terminated or revoked sooner. Performed at [facility], [place] [address] [place], [place] [address]          Radiology Studies: EEG  Result Date: 11/09/2019 Lora Havens, MD     11/09/2019  4:03 PM Patient Name: [name] MRN: [id number] Epilepsy Attending: Lora Havens Referring Physician/Provider: Etta Quill, PA Date: 11/09/2019 Duration: 24.11 mins Patient history: This is a 74 year old female who has history of seizures and nonepileptic seizures who currently is on Lamictal and Keppra.  While patient was obtaining ultrasound today she was noted to have seizure-like activity. EEG to evaluate for seizure  Level of alertness: Awake AEDs during EEG study: Keppra, LTG Technical aspects: This EEG study was done with scalp electrodes positioned according to the 10-20 International system of electrode placement. Electrical activity was acquired at a sampling rate of 500Hz  and reviewed with a high frequency filter of 70Hz  and a low frequency filter of 1Hz . EEG data were recorded continuously and digitally stored. Description: The posterior dominant rhythm consists of 9 Hz activity of moderate voltage (25-35 uV) seen predominantly in posterior head regions, symmetric and reactive to eye opening and eye closing.  Hyperventilation and photic stimulation were not performed.   IMPRESSION: This study is within normal limits. No seizures or epileptiform discharges were seen throughout the recording. [name]   CT HEAD WO CONTRAST  Result Date: 11/09/2019 CLINICAL DATA:  Seizure, nontraumatic. Additional history provided: Repeated syncopal episodes, possible seizure. EXAM: CT HEAD WITHOUT CONTRAST TECHNIQUE: Contiguous axial images were obtained from the base of the skull through the vertex without intravenous contrast. COMPARISON:  Brain MRI 02/24/2019, CT angiogram  head/neck 02/24/2019, noncontrast head CT 02/24/2019. FINDINGS: Brain: Redemonstrated small chronic cortically based infarct within the posterior right frontal lobe affecting the right precentral gyrus. No acute demarcated cortical infarct is identified. Mild chronic small vessel ischemic changes within the cerebral white matter were better appreciated on prior MRI 02/24/2019. There is no acute intracranial hemorrhage. No extra-axial fluid collection. No evidence of intracranial mass. No midline shift. Partially empty sella turcica. Vascular: No hyperdense vessel.  Atherosclerotic calcifications. Skull: Normal. Negative for fracture or focal lesion. Sinuses/Orbits: Visualized orbits show no acute finding. Mild ethmoid mucosal thickening. Tiny right maxillary sinus mucous retention cyst. No significant mastoid effusion. IMPRESSION: 1. No evidence of acute intracranial abnormality. 2. Redemonstrated small chronic cortically-based posterior right frontal lobe infarct. 3. Mild chronic small vessel ischemic disease. 4. Partially empty sella turcica. This is very commonly an incidental finding, but can be associated with idiopathic intracranial hypertension. 5. Mild ethmoid sinus mucosal thickening. Electronically Signed   By: [name] DO   On: 11/09/2019 13:37   MR BRAIN WO CONTRAST  Result Date: 11/10/2019 CLINICAL DATA:  Has history of seizures. Diabetes and hypertension. Syncopal episode. Question recent seizure. EXAM: MRI HEAD WITHOUT CONTRAST TECHNIQUE: Multiplanar, multiecho pulse sequences of the brain and surrounding structures were obtained without intravenous contrast. COMPARISON:  02/24/2019 FINDINGS: Brain: Diffusion imaging does not show any acute or subacute infarction or other cause of restricted diffusion. No focal abnormality affects the brainstem or cerebellum. There are mild to moderate changes of chronic small vessel disease of the cerebral hemispheric deep and subcortical white matter. There  is a small old right posterior frontal cortical and subcortical infarction, unchanged from previous. No evidence of mass lesion, hemorrhage, hydrocephalus or extra-axial collection. As noted  previously, there is arachnoid herniation into the sella, which can be a normal variant or can be seen with intracranial hypertension. Vascular: Major vessels at the base of the brain show flow. Skull and upper cervical spine: Negative Sinuses/Orbits: Clear/normal Other: None IMPRESSION: No change since September 2020. No acute finding. Mild to moderate chronic small-vessel ischemic change of the white matter. Old small right posterior frontal cortical and subcortical infarction. Electronically Signed   By: Nelson Chimes M.D.   On: 11/10/2019 09:26   CARDIAC CATHETERIZATION  Result Date: 11/09/2019 1. Successful deployment of Cardiomems pressure sensor. 2. Normal RA pressure and PCWP. 3. Moderate pulmonary hypertension, PVR not significantly elevated.  Think PH is due to high cardiac output. 4. Despite high calculated cardiac output, no evidence for significant intracardiac shunt lesion.   ECHOCARDIOGRAM COMPLETE  Result Date: 11/09/2019    ECHOCARDIOGRAM REPORT   Patient Name:   [name] [facility] Date of Exam: 11/09/2019 Medical Rec #:  [id number]       Height:       61.0 in Accession #:    [id number]      Weight:       213.2 lb Date of Birth:  1945-11-24      BSA:          1.941 m Patient Age:    27 years        BP:           150/75 mmHg Patient Gender: F               HR:           57 bpm. Exam Location:  Inpatient Procedure: 2D Echo, Color Doppler and Cardiac Doppler Indications:    R55 Syncope  History:        Patient has prior history of Echocardiogram examinations, most                 recent 02/25/2019. Pulmonary HTN; Risk Factors:Hypertension,                 Diabetes, Dyslipidemia and Sleep Apnea.  Sonographer:    Raquel Sarna Senior RDCS Referring Phys: [id number] [place]  1. Left ventricular  ejection fraction, by estimation, is 60 to 65%. The left ventricle has normal function. The left ventricle has no regional wall motion abnormalities. The left ventricular internal cavity size was mildly dilated. Left ventricular diastolic parameters are indeterminate.  2. Right ventricular systolic function is normal. The right ventricular size is severely enlarged. Mildly increased right ventricular wall thickness. Tricuspid regurgitation signal is inadequate for assessing PA pressure.  3. Left atrial size was moderately dilated.  4. Right atrial size was severely dilated.  5. The mitral valve is normal in structure. Trivial mitral valve regurgitation. No evidence of mitral stenosis.  6. The aortic valve is tricuspid. Aortic valve regurgitation is not visualized. Mild aortic valve sclerosis is present, with no evidence of aortic valve stenosis.  7. Mild pulmonic stenosis.  8. The inferior [name] cava is dilated in size with >50% respiratory variability, suggesting right atrial pressure of 8 mmHg. Comparison(s): No significant change from prior study. FINDINGS  Left Ventricle: Left ventricular ejection fraction, by estimation, is 60 to 65%. The left ventricle has normal function. The left ventricle has no regional wall motion abnormalities. The left ventricular internal cavity size was mildly dilated. There is  no left ventricular hypertrophy. Left ventricular diastolic parameters are indeterminate. Right Ventricle: The right ventricular size is severely enlarged. Mildly  increased right ventricular wall thickness. Right ventricular systolic function is normal. Tricuspid regurgitation signal is inadequate for assessing PA pressure. Left Atrium: Left atrial size was moderately dilated. Right Atrium: Right atrial size was severely dilated. Pericardium: Trivial pericardial effusion is present. Mitral Valve: The mitral valve is normal in structure. Trivial mitral valve regurgitation. No evidence of mitral valve stenosis.  Tricuspid Valve: The tricuspid valve is normal in structure. Tricuspid valve regurgitation is trivial. No evidence of tricuspid stenosis. Aortic Valve: The aortic valve is tricuspid. Aortic valve regurgitation is not visualized. Mild aortic valve sclerosis is present, with no evidence of aortic valve stenosis. There is mild calcification of the aortic valve. Pulmonic Valve: The pulmonic valve was grossly normal. Pulmonic valve regurgitation is not visualized. Mild pulmonic stenosis. Aorta: The aortic root and ascending aorta are structurally normal, with no evidence of dilitation. Venous: The inferior [name] cava is dilated in size with greater than 50% respiratory variability, suggesting right atrial pressure of 8 mmHg. IAS/Shunts: No atrial level shunt detected by color flow Doppler.  LEFT VENTRICLE PLAX 2D LVIDd:         5.60 cm  Diastology LVIDs:         3.50 cm  LV e' lateral:   6.96 cm/s LV PW:         1.10 cm  LV E/e' lateral: 9.8 LV IVS:        0.70 cm  LV e' medial:    6.20 cm/s LVOT diam:     1.80 cm  LV E/e' medial:  11.0 LV SV:         64 LV SV Index:   33 LVOT Area:     2.54 cm  RIGHT VENTRICLE RV S prime:     11.70 cm/s TAPSE (M-mode): 2.0 cm LEFT ATRIUM             Index       RIGHT ATRIUM           Index LA diam:        4.40 cm 2.27 cm/m  RA Area:     35.90 cm LA Vol (A2C):   79.5 ml 40.96 ml/m RA Volume:   149.00 ml 76.78 ml/m LA Vol (A4C):   82.9 ml 42.72 ml/m LA Biplane Vol: 82.7 ml 42.61 ml/m  AORTIC VALVE LVOT Vmax:   99.90 cm/s LVOT Vmean:  68.500 cm/s LVOT VTI:    0.252 m  AORTA Ao Root diam: 2.70 cm Ao Asc diam:  2.80 cm MITRAL VALVE MV Area (PHT): 2.80 cm    SHUNTS MV Decel Time: 271 msec    Systemic VTI:  0.25 m MV E velocity: 68.10 cm/s  Systemic Diam: 1.80 cm MV A velocity: 67.70 cm/s MV E/A ratio:  1.01 Buford Dresser MD Electronically signed by Buford Dresser MD Signature Date/Time: 11/09/2019/2:31:44 PM    Final         Scheduled Meds: . atorvastatin  10 mg  Oral QPM  . carvedilol  25 mg Oral BID WC  . dextrose  1 ampule Intravenous Once  . enoxaparin (LOVENOX) injection  40 mg Subcutaneous Q24H  . hydrALAZINE  100 mg Oral BID  . insulin aspart  0-15 Units Subcutaneous TID WC  . insulin aspart  0-5 Units Subcutaneous QHS  . isosorbide mononitrate  60 mg Oral Daily  . lamoTRIgine  150 mg Oral BID  . levETIRAcetam  500 mg Oral BID  . lipase/protease/amylase  36,000 Units Oral Q24H  . lipase/protease/amylase  Oral BID WC  . sertraline  75 mg Oral Daily  . sodium chloride flush  3 mL Intravenous Once  . sodium chloride flush  3 mL Intravenous Q12H  . sodium chloride flush  3 mL Intravenous Q12H  . torsemide  40 mg Oral Daily   Continuous Infusions: . sodium chloride       LOS: 1 day   Time spent= 35 mins    Seynabou Fults Arsenio Loader, MD [facility]  If 7PM-7AM, please contact night-coverage  11/10/2019, 11:34 AM

## 2019-11-10 NOTE — Progress Notes (Signed)
Patient ID: [name], female   DOB: 12-21-1945, 74 y.o.   MRN: [id number]   Progress Note  Patient Name: [name] Date of Encounter: 11/10/2019  Primary Cardiologist: Glenetta Hew, MD   Subjective   Patient was noted to have an episode of jerking and loss of consciousness during her echocardiogram yesterday.  She was on telemetry, there was no arrhythmia.  Concern for seizure, neurology consulted.  MRI shows old right posterior frontal CVA/nothing acute, EEG was unremarkable.   Today, no complaints.  No significant arrhythmia on telemetry (baseline sinus arrhythmia)  Echo showed EF 60-65% with dilated RV.   RHC Procedural Findings: Hemodynamics (mmHg) RA mean 7 RV 59/9 PA 58/21, mean 34 PCWP mean 12 Oxygen saturations: IVC 81% RA 86% PA 86% AO 97% Cardiac Output (Fick) 16  Cardiac Index (Fick) 8.3 Cardiac Output (Thermo) 7.68 Cardiac Index (Thermo) 3.95 PVR 2.86 WU  Cardiomems placed  Inpatient Medications    Scheduled Meds: . atorvastatin  10 mg Oral QPM  . carvedilol  25 mg Oral BID WC  . enoxaparin (LOVENOX) injection  40 mg Subcutaneous Q24H  . hydrALAZINE  100 mg Oral BID  . insulin aspart  0-15 Units Subcutaneous TID WC  . insulin aspart  0-5 Units Subcutaneous QHS  . isosorbide mononitrate  60 mg Oral Daily  . lamoTRIgine  150 mg Oral BID  . levETIRAcetam  500 mg Oral BID  . lipase/protease/amylase  36,000 Units Oral Q24H  . lipase/protease/amylase   Oral BID WC  . sertraline  75 mg Oral Daily  . sodium chloride flush  3 mL Intravenous Once  . sodium chloride flush  3 mL Intravenous Q12H  . sodium chloride flush  3 mL Intravenous Q12H  . torsemide  40 mg Oral Daily   Continuous Infusions: . sodium chloride     PRN Meds: sodium chloride, acetaminophen **OR** acetaminophen, acetaminophen, acetaminophen, ondansetron **OR** ondansetron (ZOFRAN) IV, ondansetron (ZOFRAN) IV, polyethylene glycol, senna-docusate, sodium chloride flush   Vital  Signs    Vitals:   11/09/19 2100 11/09/19 2345 11/10/19 0441 11/10/19 0817  BP:  (!) 99/58 128/65 121/67  Pulse:  60 68 68  Resp:  17 18 18   Temp:  98.2 F (36.8 C) 98 F (36.7 C) 98.1 F (36.7 C)  TempSrc:  Oral Oral Oral  SpO2:  92% 93% 96%  Weight:   95.8 kg   Height: 5\' 1"  (1.549 m)       Intake/Output Summary (Last 24 hours) at 11/10/2019 1127 Last data filed at 11/09/2019 2100 Gross per 24 hour  Intake 240 ml  Output --  Net 240 ml   Last 3 Weights 11/10/2019 11/09/2019 11/08/2019  Weight (lbs) 211 lb 3.9 oz 213 lb 3 oz 213 lb 4.8 oz  Weight (kg) 95.82 kg 96.7 kg 96.752 kg      Telemetry    NSR with sinus arrhythmia (personally reviewed)  Physical Exam   General: NAD Neck: No JVD, no thyromegaly or thyroid nodule.  Lungs: Clear to auscultation bilaterally with normal respiratory effort. CV: Nondisplaced PMI.  Heart regular S1/S2, no S3/S4, no murmur.  No peripheral edema.   Abdomen: Soft, nontender, no hepatosplenomegaly, no distention.  Skin: Intact without lesions or rashes.  Neurologic: Alert and oriented x 3.  Psych: Normal affect. Extremities: No clubbing or cyanosis.  HEENT: Normal.   Labs    High Sensitivity Troponin:   Recent Labs  Lab 11/08/19 1306 11/08/19 1549 11/09/19 0021  TROPONINIHS 8  7 8      Chemistry Recent Labs  Lab 11/08/19 1306 11/08/19 1306 11/09/19 0324 11/09/19 0935 11/09/19 1005 11/09/19 1006 11/09/19 1133 11/10/19 0402  NA 136   < > 140   < > 135 141  --  136  K 4.4   < > 3.5   < > 3.0* 3.6  --  3.5  CL 96*  --  102  --   --   --   --  97*  CO2 28  --  31  --   --   --   --  29  GLUCOSE 195*  --  99  --   --   --   --  120*  BUN 16  --  14  --   --   --   --  12  CREATININE 1.05*   < > 0.94  --   --   --  0.83 1.03*  CALCIUM 9.4  --  9.0  --   --   --   --  8.9  GFRNONAA 53*   < > >60  --   --   --  >60 55*  GFRAA >60   < > >60  --   --   --  >60 >60  ANIONGAP 12  --  7  --   --   --   --  10   < > = values in  this interval not displayed.     Hematology Recent Labs  Lab 11/09/19 0324 11/09/19 0935 11/09/19 1006 11/09/19 1417 11/10/19 0402  WBC 4.2  --   --  5.7 5.2  RBC 4.71  --   --  5.33* 4.98  HGB 10.7*   < > 11.9* 12.0 11.5*  HCT 34.7*   < > 35.0* 39.2 36.6  MCV 73.7*  --   --  73.5* 73.5*  MCH 22.7*  --   --  22.5* 23.1*  MCHC 30.8  --   --  30.6 31.4  RDW 16.6*  --   --  16.7* 16.5*  PLT 261  --   --  281 293   < > = values in this interval not displayed.    BNP Recent Labs  Lab 11/09/19 0021  BNP 116.6*     DDimer No results for input(s): DDIMER in the last 168 hours.   Radiology    EEG  Result Date: 11/09/2019 Lora Havens, MD     11/09/2019  4:03 PM Patient Name: [name] MRN: [id number] Epilepsy Attending: Lora Havens Referring Physician/Provider: Etta Quill, PA Date: 11/09/2019 Duration: 24.11 mins Patient history: This is a 74 year old female who has history of seizures and nonepileptic seizures who currently is on Lamictal and Keppra.  While patient was obtaining ultrasound today she was noted to have seizure-like activity. EEG to evaluate for seizure  Level of alertness: Awake AEDs during EEG study: Keppra, LTG Technical aspects: This EEG study was done with scalp electrodes positioned according to the 10-20 International system of electrode placement. Electrical activity was acquired at a sampling rate of 500Hz  and reviewed with a high frequency filter of 70Hz  and a low frequency filter of 1Hz . EEG data were recorded continuously and digitally stored. Description: The posterior dominant rhythm consists of 9 Hz activity of moderate voltage (25-35 uV) seen predominantly in posterior head regions, symmetric and reactive to eye opening and eye closing.  Hyperventilation and photic stimulation were not performed.   IMPRESSION:  This study is within normal limits. No seizures or epileptiform discharges were seen throughout the recording. [name]   CT  HEAD WO CONTRAST  Result Date: 11/09/2019 CLINICAL DATA:  Seizure, nontraumatic. Additional history provided: Repeated syncopal episodes, possible seizure. EXAM: CT HEAD WITHOUT CONTRAST TECHNIQUE: Contiguous axial images were obtained from the base of the skull through the vertex without intravenous contrast. COMPARISON:  Brain MRI 02/24/2019, CT angiogram head/neck 02/24/2019, noncontrast head CT 02/24/2019. FINDINGS: Brain: Redemonstrated small chronic cortically based infarct within the posterior right frontal lobe affecting the right precentral gyrus. No acute demarcated cortical infarct is identified. Mild chronic small vessel ischemic changes within the cerebral white matter were better appreciated on prior MRI 02/24/2019. There is no acute intracranial hemorrhage. No extra-axial fluid collection. No evidence of intracranial mass. No midline shift. Partially empty sella turcica. Vascular: No hyperdense vessel.  Atherosclerotic calcifications. Skull: Normal. Negative for fracture or focal lesion. Sinuses/Orbits: Visualized orbits show no acute finding. Mild ethmoid mucosal thickening. Tiny right maxillary sinus mucous retention cyst. No significant mastoid effusion. IMPRESSION: 1. No evidence of acute intracranial abnormality. 2. Redemonstrated small chronic cortically-based posterior right frontal lobe infarct. 3. Mild chronic small vessel ischemic disease. 4. Partially empty sella turcica. This is very commonly an incidental finding, but can be associated with idiopathic intracranial hypertension. 5. Mild ethmoid sinus mucosal thickening. Electronically Signed   By: [name] DO   On: 11/09/2019 13:37   MR BRAIN WO CONTRAST  Result Date: 11/10/2019 CLINICAL DATA:  Has history of seizures. Diabetes and hypertension. Syncopal episode. Question recent seizure. EXAM: MRI HEAD WITHOUT CONTRAST TECHNIQUE: Multiplanar, multiecho pulse sequences of the brain and surrounding structures were obtained without  intravenous contrast. COMPARISON:  02/24/2019 FINDINGS: Brain: Diffusion imaging does not show any acute or subacute infarction or other cause of restricted diffusion. No focal abnormality affects the brainstem or cerebellum. There are mild to moderate changes of chronic small vessel disease of the cerebral hemispheric deep and subcortical white matter. There is a small old right posterior frontal cortical and subcortical infarction, unchanged from previous. No evidence of mass lesion, hemorrhage, hydrocephalus or extra-axial collection. As noted previously, there is arachnoid herniation into the sella, which can be a normal variant or can be seen with intracranial hypertension. Vascular: Major vessels at the base of the brain show flow. Skull and upper cervical spine: Negative Sinuses/Orbits: Clear/normal Other: None IMPRESSION: No change since September 2020. No acute finding. Mild to moderate chronic small-vessel ischemic change of the white matter. Old small right posterior frontal cortical and subcortical infarction. Electronically Signed   By: Nelson Chimes M.D.   On: 11/10/2019 09:26   CARDIAC CATHETERIZATION  Result Date: 11/09/2019 1. Successful deployment of Cardiomems pressure sensor. 2. Normal RA pressure and PCWP. 3. Moderate pulmonary hypertension, PVR not significantly elevated.  Think PH is due to high cardiac output. 4. Despite high calculated cardiac output, no evidence for significant intracardiac shunt lesion.   ECHOCARDIOGRAM COMPLETE  Result Date: 11/09/2019    ECHOCARDIOGRAM REPORT   Patient Name:   [name] [facility] Date of Exam: 11/09/2019 Medical Rec #:  [id number]       Height:       61.0 in Accession #:    [id number]      Weight:       213.2 lb Date of Birth:  12-10-1945      BSA:          1.941 m Patient Age:    17  years        BP:           150/75 mmHg Patient Gender: F               HR:           57 bpm. Exam Location:  Inpatient Procedure: 2D Echo, Color Doppler and Cardiac  Doppler Indications:    R55 Syncope  History:        Patient has prior history of Echocardiogram examinations, most                 recent 02/25/2019. Pulmonary HTN; Risk Factors:Hypertension,                 Diabetes, Dyslipidemia and Sleep Apnea.  Sonographer:    Raquel Sarna Senior RDCS Referring Phys: [id number] Schleicher  1. Left ventricular ejection fraction, by estimation, is 60 to 65%. The left ventricle has normal function. The left ventricle has no regional wall motion abnormalities. The left ventricular internal cavity size was mildly dilated. Left ventricular diastolic parameters are indeterminate.  2. Right ventricular systolic function is normal. The right ventricular size is severely enlarged. Mildly increased right ventricular wall thickness. Tricuspid regurgitation signal is inadequate for assessing PA pressure.  3. Left atrial size was moderately dilated.  4. Right atrial size was severely dilated.  5. The mitral valve is normal in structure. Trivial mitral valve regurgitation. No evidence of mitral stenosis.  6. The aortic valve is tricuspid. Aortic valve regurgitation is not visualized. Mild aortic valve sclerosis is present, with no evidence of aortic valve stenosis.  7. Mild pulmonic stenosis.  8. The inferior vena cava is dilated in size with >50% respiratory variability, suggesting right atrial pressure of 8 mmHg. Comparison(s): No significant change from prior study. FINDINGS  Left Ventricle: Left ventricular ejection fraction, by estimation, is 60 to 65%. The left ventricle has normal function. The left ventricle has no regional wall motion abnormalities. The left ventricular internal cavity size was mildly dilated. There is  no left ventricular hypertrophy. Left ventricular diastolic parameters are indeterminate. Right Ventricle: The right ventricular size is severely enlarged. Mildly increased right ventricular wall thickness. Right ventricular systolic function is normal.  Tricuspid regurgitation signal is inadequate for assessing PA pressure. Left Atrium: Left atrial size was moderately dilated. Right Atrium: Right atrial size was severely dilated. Pericardium: Trivial pericardial effusion is present. Mitral Valve: The mitral valve is normal in structure. Trivial mitral valve regurgitation. No evidence of mitral valve stenosis. Tricuspid Valve: The tricuspid valve is normal in structure. Tricuspid valve regurgitation is trivial. No evidence of tricuspid stenosis. Aortic Valve: The aortic valve is tricuspid. Aortic valve regurgitation is not visualized. Mild aortic valve sclerosis is present, with no evidence of aortic valve stenosis. There is mild calcification of the aortic valve. Pulmonic Valve: The pulmonic valve was grossly normal. Pulmonic valve regurgitation is not visualized. Mild pulmonic stenosis. Aorta: The aortic root and ascending aorta are structurally normal, with no evidence of dilitation. Venous: The inferior vena cava is dilated in size with greater than 50% respiratory variability, suggesting right atrial pressure of 8 mmHg. IAS/Shunts: No atrial level shunt detected by color flow Doppler.  LEFT VENTRICLE PLAX 2D LVIDd:         5.60 cm  Diastology LVIDs:         3.50 cm  LV e' lateral:   6.96 cm/s LV PW:         1.10 cm  LV  E/e' lateral: 9.8 LV IVS:        0.70 cm  LV e' medial:    6.20 cm/s LVOT diam:     1.80 cm  LV E/e' medial:  11.0 LV SV:         64 LV SV Index:   33 LVOT Area:     2.54 cm  RIGHT VENTRICLE RV S prime:     11.70 cm/s TAPSE (M-mode): 2.0 cm LEFT ATRIUM             Index       RIGHT ATRIUM           Index LA diam:        4.40 cm 2.27 cm/m  RA Area:     35.90 cm LA Vol (A2C):   79.5 ml 40.96 ml/m RA Volume:   149.00 ml 76.78 ml/m LA Vol (A4C):   82.9 ml 42.72 ml/m LA Biplane Vol: 82.7 ml 42.61 ml/m  AORTIC VALVE LVOT Vmax:   99.90 cm/s LVOT Vmean:  68.500 cm/s LVOT VTI:    0.252 m  AORTA Ao Root diam: 2.70 cm Ao Asc diam:  2.80 cm MITRAL  VALVE MV Area (PHT): 2.80 cm    SHUNTS MV Decel Time: 271 msec    Systemic VTI:  0.25 m MV E velocity: 68.10 cm/s  Systemic Diam: 1.80 cm MV A velocity: 67.70 cm/s MV E/A ratio:  1.01 Buford Dresser MD Electronically signed by Buford Dresser MD Signature Date/Time: 11/09/2019/2:31:44 PM    Final     Cardiac Studies   Cardiac monitor 02/27/09:  Predominant rhythm sinus with rates ranging from 52 to 92 bpm. Average 65 bpm.  Rare, <1% PACs or PVCs. No couplets or triplets noted, and very rare ventricular bigeminy noted.  Total of 20 short bursts of PSVT/PAT ranging from 5-10 beats in heart rate from 103 to 132 bpm.  Most of patient's symptoms were noted with sinus rhythm, or sinus rhythm with rare PACs/PVCs. Symptoms not noted during episodes of PSVT or PAT.  No atrial fibrillation, atrial flutter or prolonged PSVT/PAT noted.  No bradycardia events noted. Sinus bradycardia noted during sleeping hours.  Overall, relatively reassuring study. There were several short little bursts of fast heart rates, but not noted on symptoms. Probably not long enough to make patient feel lightheaded, dizzy or short of breath. Would only be felt as a quick little bursts of fluttering.  Would not be inclined to treat.  RHC/RHC 04/06/19:  Hemodynamic findings consistent with mild pulmonary hypertension -with moderately elevated LVEDP  There is no aortic valve stenosis.  Angiographically normal coronary arteries with LEFT-LAD dominant system  SUMMARY  Angiographically normal coronary arteries with essentially LAD dominant system.  Right Heart Cath pressures show mild-moderate pulmonary hypertension with elevated LVEDP suggesting combination of diastolic dysfunction related as well as obesity hypoventilation related pulmonary hypertension  Normal/hyperdynamic cardiac output and index.   RECOMMENDATIONS  Discharge home after bedrest.  For now continue current medications as she  seems to be adequately diuresed. We will need to avoid overdiuresis in the future. -->Shorter courses of increased Lasix for worsening edema or dyspnea.  Will review with advanced heart failure team.  TTE 02/25/19: 1. The left ventricle has low normal systolic function, with an ejection  fraction of 50-55%. The cavity size was normal. Left ventricular diastolic  Doppler parameters are consistent with impaired relaxation. No evidence of  left ventricular regional wall  motion abnormalities.  2. Although TAPSE is measured  as normal, the right ventricle visually  appears to have moderate to severely reduced systolic function. The cavity  was severely enlarged. There is no increase in right ventricular wall  thickness. Right ventricular systolic  pressure could not be assessed.  3. Left atrial size was mildly dilated.  4. Hypermobile interatrial septum.  5. Right atrial size was severely dilated.  6. The aorta is normal unless otherwise noted.  7. The inferior vena cava was dilated in size with <50% respiratory  variability.   Patient Profile     74 y.o. female with a hx of OHS/OSA, CVA, seizure disorder, type 2 diabetes, hypertension, hyperlipidemia, chronic diastolic heart failure, pulmonary hypertension who is being seen today for the evaluation of syncope.  Assessment & Plan    1. Syncope: She presented after an episode of syncope. Has had in the past.  Prior heart monitoring has not shown significant arrhythmia but did not catch a syncopal event.  She had an episode of jerking with LOC while in hospital, this was not associated with an arrhythmia.  Given history of seizure disorder, suspected to be a seizure.  Cruz Condon monitor was not placed yesterday given suspicion for seizure (no arrhythmia on monitoring at time of syncope).  2. Chronic diastolic heart failure: Echo this admission with EF 60-65%, severely dilated RV. RHC/LHC in 10/20 showed mild-moderate pulmonary venous  hypertension and no significant coronary disease. Cause of RV dysfunction is not clear, possibly due to diastolic CHF + OHS/OSA.  V/Q scan in 2/21 did not show acute or chronic PE, PFTs in 5/21 were normal. RHC yesterday showed normal right and left heart filling pressures on current dose of torsemide. High output on RHC yesterday but no evidence for intracardiac shunt on shunt run.  She is not volume overloaded on exam.  - Continue torsemide 40 mg daily.  - She now has a Cardiomems device.  3. Pulmonary hypertension: RHC this admission with moderate pulmonary hypertension, suspect group 2/group 3 in setting of diastolic CHF, OHS/OSA, and high cardiac output.   - I do not think that there is a role for selective pulmonary vasodilators.  4. Hypertension -  Continue coreg 25 mg BID, hydralazine 100 mg BID, Imdur 5. OHS/OSA: on CPAP but no oxygen 6. Seizure d/o: Has had both seizures and pseudoseizures per notes.  Suspect she had one or the other this admission.  - Per neurology.  7. Old CVA: MRI with evidence for prior CVA (nothing acute).  - Continue statin. - Add ASA 81.   - Further workup per neurology => may want to still consider LINQ monitor to r/o atrial fibrillation given prior CVA.     Signed, Loralie Champagne, MD  11/10/2019, 11:27 AM

## 2019-11-11 LAB — CBC
HCT: 35.5 % — ABNORMAL LOW (ref 36.0–46.0)
Hemoglobin: 11 g/dL — ABNORMAL LOW (ref 12.0–15.0)
MCH: 22.8 pg — ABNORMAL LOW (ref 26.0–34.0)
MCHC: 31 g/dL (ref 30.0–36.0)
MCV: 73.7 fL — ABNORMAL LOW (ref 80.0–100.0)
Platelets: 264 10*3/uL (ref 150–400)
RBC: 4.82 MIL/uL (ref 3.87–5.11)
RDW: 16.5 % — ABNORMAL HIGH (ref 11.5–15.5)
WBC: 5.5 10*3/uL (ref 4.0–10.5)
nRBC: 0 % (ref 0.0–0.2)

## 2019-11-11 LAB — GLUCOSE, CAPILLARY
Glucose-Capillary: 140 mg/dL — ABNORMAL HIGH (ref 70–99)
Glucose-Capillary: 166 mg/dL — ABNORMAL HIGH (ref 70–99)
Glucose-Capillary: 159 mg/dL — ABNORMAL HIGH (ref 70–99)

## 2019-11-11 LAB — BASIC METABOLIC PANEL
Anion gap: 11 (ref 5–15)
BUN: 19 mg/dL (ref 8–23)
CO2: 28 mmol/L (ref 22–32)
Calcium: 8.9 mg/dL (ref 8.9–10.3)
Chloride: 95 mmol/L — ABNORMAL LOW (ref 98–111)
Creatinine, Ser: 1.32 mg/dL — ABNORMAL HIGH (ref 0.44–1.00)
GFR calc Af Amer: 47 mL/min — ABNORMAL LOW (ref >60)
GFR calc non Af Amer: 40 mL/min — ABNORMAL LOW (ref >60)
Glucose, Bld: 160 mg/dL — ABNORMAL HIGH (ref 70–99)
Potassium: 3.7 mmol/L (ref 3.5–5.1)
Sodium: 134 mmol/L — ABNORMAL LOW (ref 135–145)

## 2019-11-11 LAB — MAGNESIUM: Magnesium: 1.9 mg/dL (ref 1.7–2.4)

## 2019-11-11 MED ORDER — NITROGLYCERIN 0.4 MG SL SUBL
SUBLINGUAL_TABLET | SUBLINGUAL | Status: AC
  Filled 2019-11-11: qty 1

## 2019-11-11 MED ORDER — ALUM & MAG HYDROXIDE-SIMETH 200-200-20 MG/5ML PO SUSP
30.0000 mL | Freq: Once | ORAL | Status: AC
  Administered 2019-11-11: 30 mL via ORAL
  Filled 2019-11-11: qty 30

## 2019-11-11 MED ORDER — ACETAMINOPHEN 160 MG/5ML PO SOLN: 500.0000 mg | Freq: Three times a day (TID) | ORAL | Status: DC | PRN

## 2019-11-11 MED ORDER — ISOSORBIDE MONONITRATE ER 30 MG PO TB24
30.0000 mg | ORAL_TABLET | Freq: Every day | ORAL | 11 refills | Status: DC
Start: 2019-11-11 — End: 2020-11-01

## 2019-11-11 MED ORDER — TORSEMIDE 20 MG PO TABS: 40.0000 mg | ORAL_TABLET | Freq: Every day | ORAL | 4 refills | Status: DC

## 2019-11-11 MED ORDER — POLYETHYLENE GLYCOL 3350 17 G PO PACK: 17.0000 g | PACK | Freq: Every day | ORAL | 0 refills | Status: DC | PRN

## 2019-11-11 MED ORDER — HYDRALAZINE HCL 25 MG PO TABS
50.0000 mg | ORAL_TABLET | Freq: Two times a day (BID) | ORAL | 11 refills | Status: DC
Start: 2019-11-11 — End: 2019-12-03

## 2019-11-11 MED ORDER — ASPIRIN 81 MG PO CHEW: 81.0000 mg | CHEWABLE_TABLET | Freq: Every day | ORAL | Status: DC

## 2019-11-11 MED ORDER — INSULIN GLARGINE 100 UNIT/ML ~~LOC~~ SOLN
SUBCUTANEOUS | 3 refills | Status: DC
Start: 2019-11-11 — End: 2019-11-23

## 2019-11-11 MED ORDER — LEVETIRACETAM 500 MG PO TABS: 500.0000 mg | ORAL_TABLET | Freq: Two times a day (BID) | ORAL | 6 refills | Status: DC

## 2019-11-11 NOTE — Discharge Instructions (Signed)
Please note that I have decreased her dose of Lantus so monitor her blood sugar closely at home.  The Lantus dosing will need to be adjusted later on.

## 2019-11-11 NOTE — TOC Initial Note (Signed)
Transition of Care [facility]) - Initial/Assessment Note    Patient Details  Name: [name] MRN: [id number] Date of Birth: 1946-03-23  Transition of Care [facility]) CM/SW Contact:    Bethena Roys, RN Phone Number: 11/11/2019, 5:15 PM  Clinical Narrative: Patient presented for syncopal episode. Prior to arrival patient was from home with sister. Patient has durable medical equipment rollator, cpap and shower chair. Patient in need of home health services-physical therapy. Medicare.gov list provided to patient and [facility] chose [facility]. Referral made and [facility] can service the patient. Start of care to begin within 24-48 hours post transition home. Patient's sister to provide transportation home.                  Expected Discharge Plan: [place] Barriers to Discharge: No Barriers Identified   Patient Goals and CMS Choice Patient states their goals for this hospitalization and ongoing recovery are:: to return home CMS Medicare.gov Compare Post Acute Care list provided to:: Patient Choice offered to / list presented to : Patient  Expected Discharge Plan and Services Expected Discharge Plan: [place] In-house Referral: NA Discharge Planning Services: CM Consult Post Acute Care Choice: [place] arrangements for the past 2 months: Single Family Home Expected Discharge Date: 11/11/19               DME Arranged: N/A         HH Arranged: PT HH Agency: [facility] (formerly [employer]) Date [place]: 11/11/19 Time [place]: 55 Representative spoke with at [place]: [place] Arrangements/Services Living arrangements for the past 2 months: [place] Lives with:: Siblings Patient language and need for interpreter reviewed:: Yes Do you feel safe going back to the place where you live?: Yes      Need for Family Participation in Patient Care: Yes (Comment) Care giver  support system in place?: Yes (comment)   Criminal Activity/Legal Involvement Pertinent to Current Situation/Hospitalization: No - Comment as needed  Activities of Daily Living Home Assistive Devices/Equipment: Walker (specify type), CBG Meter, Blood pressure cuff, Cane (specify quad or straight) ADL Screening (condition at time of admission) Patient's cognitive ability adequate to safely complete daily activities?: Yes Is the patient deaf or have difficulty hearing?: No Does the patient have difficulty seeing, even when wearing glasses/contacts?: No Does the patient have difficulty concentrating, remembering, or making decisions?: No Patient able to express need for assistance with ADLs?: Yes Does the patient have difficulty dressing or bathing?: No Independently performs ADLs?: Yes (appropriate for developmental age) Does the patient have difficulty walking or climbing stairs?: Yes Weakness of Legs: Right Weakness of Arms/Hands: None  Permission Sought/Granted Permission sought to share information with : Family Supports, Chartered certified accountant granted to share information with : Yes, Verbal Permission Granted     Permission granted to share info w AGENCY: [facility]        Emotional Assessment Appearance:: Appears stated age Attitude/Demeanor/Rapport: Engaged Affect (typically observed): Accepting Orientation: : Oriented to Self, Oriented to Place, Oriented to  Time, Oriented to Situation Alcohol / Substance Use: Not Applicable Psych Involvement: No (comment)  Admission diagnosis:  Syncope [R55] Patient Active Problem List   Diagnosis Date Noted  . Neuroleptic-induced Parkinsonism ([place]) 09/28/2019  . Class 3 severe obesity due to excess calories with serious comorbidity and body mass index (BMI) of 40.0 to 44.9 in adult [facility]) 05/22/2019  .  Pulmonary hypertension, unspecified ([place]) 04/06/2019  . Syncope 02/24/2019  . Rheumatoid arthritis ([place])  01/21/2019  . Type 2 diabetes mellitus with stage 2 chronic kidney disease, with long-term current use of insulin ([place]) 07/21/2018  . Chest pain 05/15/2018  . 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 Excessive daytime sleepiness 01/30/2018  . Sleep-related hypoventilation 01/30/2018  . Recurrent depression ([place]) 11/06/2017  . OSA on CPAP 10/07/2017  . Todd's paralysis ([place])   . Chronic diastolic CHF (congestive heart failure), NYHA class 2 ([place])   . Chronic respiratory failure with hypoxia ([place])   . Acute lower GI bleeding   . Stroke-like symptoms 06/15/2017  . Rectal bleeding 06/15/2017  . Dehydration 03/07/2017  . Medication management 12/24/2016  . Near syncope 03/09/2016  . Racing heart beat 03/09/2016  . Spinal stenosis at L4-L5 level 10/04/2014  . Lower abdominal pain 10/31/2012  . Type II diabetes mellitus, uncontrolled ([place]) 10/31/2012  . Seizure disorder ([place]) 10/31/2012  . Diverticulosis 10/31/2012  . Anemia associated with acute blood loss 08/14/2012  . Hematochezia 08/14/2012  . Varicose veins of bilateral lower extremities with other complications [id number]  . Leg pain 03/21/2012  . Radicular pain of left lower extremity 03/21/2012  . Infected pseudocyst of pancreas 10/28/2011  . Chronic pancreatitis ([place]) 10/28/2011  . Hypertensive heart disease with chronic diastolic congestive heart failure ([place]) 10/28/2011  . Dyslipidemia, goal LDL below 100 10/28/2011  . Morbid obesity with BMI of 45.0-49.9, adult ([place]) 10/28/2011  . Peripheral edema 04/14/2011  . Obesity hypoventilation syndrome ([place]) 10/26/2010  . Atherosclerotic heart disease of native coronary artery with other forms of angina pectoris ([place]) 08/28/2010   PCP:  [name], MD Pharmacy:   [facility] - [place], [address] [place] [address] Phone: [phone] Fax:  [phone]  [place], [address] [place] [address] Phone: [phone] Fax: [phone]   Readmission Risk Interventions No flowsheet data found.

## 2019-11-11 NOTE — Progress Notes (Addendum)
Pt c/o sharp chest pain in left upper which is radiating towards left side of face and jaw 10/10.  Pt was sitting in a recliner.  Soft BP in SBP 98-105. Repositioned her in bed and done EKG. CHF and attending made aware.  Idolina Primer, RN

## 2019-11-11 NOTE — Discharge Summary (Signed)
Physician Discharge Summary  [name] ENM:[id number] DOB: 30-May-1946 DOA: 11/08/2019  PCP: [name], MD  Admit date: 11/08/2019 Discharge date: 11/11/2019  Admitted From: Home Disposition: Home  Recommendations for Outpatient Follow-up:  1. Follow up with PCP in 1-2 weeks 2. Please obtain BMP/CBC in one week 3. Please follow up with cardiology and neurology  Home Health: Physical therapy Equipment/Devices: None Discharge Condition: Stable CODE STATUS full code Diet recommendation: Cardiac Brief/Interim Summary:74 y.o.femalewithHTN, DM, COPD, OHS/OSA chronic HFpEF, epilepsy, pancreatic insufficiency who presented with syncope.  Previously has undergone extensive cardiac work-up.  Cardiology team consulted, and CardioMEMs placement.  Hospital course complicated by seizure.  Cardiology team thinks her symptoms are secondary to seizures, underwent seizure evaluation which in the hospital was negative.  EEG, CT head, MRI brain-negative for any acute pathology.  Recommended continuing same seizure medications.    Discharge Diagnoses:  Active Problems:   Obesity hypoventilation syndrome (HCC)   Chronic pancreatitis (HCC)   Hypertensive heart disease with chronic diastolic congestive heart failure (HCC)   Morbid obesity with BMI of 45.0-49.9, adult (HCC)   Type II diabetes mellitus, uncontrolled ([place])   Seizure disorder ([place])   Syncope   Pulmonary hypertension, unspecified ([place])   Neuroleptic-induced Parkinsonism ([place])   #1 recurrent syncope patient was admitted with this diagnosis.  She was seen in consultation by cardiology and neurology.  Patient had seizures during the hospital stay.  CT of the head MRI of the brain and EEG were negative for any acute pathology.  Neurology recommended to increase her Keppra to 500 twice daily and to continue Lamictal.  She was seizure-free 24 hours prior to discharge. Patient had left-sided chest pain radiating to her left face and  jaw which was pleuritic in nature which is stopped without any medications.  EKG showed no acute changes.  She was asked to follow-up with Dr. Ellyn Hack as an outpatient.  They recommended to continue aspirin atorvastatin Coreg hydralazine Imdur and torsemide.   #2  type 2 diabetes patient had episode of hypoglycemia during hospital stay.  Her insulin dose was decreased to 5 units of Lantus at bedtime on discharge.  #3 chronic diastolic CHF stable  #4 obstructive sleep apnea continue to use CPAP. Estimated body mass index is 39.56 kg/m as calculated from the following:   Height as of this encounter: _0  (1.549 m).   Weight as of this encounter: 95 kg.  Discharge Instructions  Discharge Instructions    Diet - low sodium heart healthy   Complete by: As directed    Increase activity slowly   Complete by: As directed      Allergies as of 11/11/2019      Reactions   Other Anaphylaxis, Rash   Bleach   Penicillins Hives   Did it involve swelling of the face/tongue/throat, SOB, or low BP? No Did it involve sudden or severe rash/hives, skin peeling, or any reaction on the inside of your mouth or nose? Yes Did you need to seek medical attention at a hospital or doctor's office? Yes When did it last happen?Childhood allergy  If all above answers are "NO", may proceed with cephalosporin use.   Sulfa Antibiotics Hives   Aspirin Other (See Comments)    On aspirin 81 mg - Rectal bleeding in dec 2018   Codeine    Headache and makes the patient feel "off"      Medication List    STOP taking these medications   furosemide 40 MG tablet Commonly  known as: LASIX   furosemide 80 MG tablet Commonly known as: LASIX   metolazone 2.5 MG tablet Commonly known as: ZAROXOLYN     TAKE these medications   Accu-Chek Aviva Plus test strip Generic drug: glucose blood Use as instructed to check blood sugars 2 times per day dx: e11.22   Accu-Chek Aviva Plus w/Device Kit Use as directed to  check blood sugars 2 times per day dx: e11.22   Accu-Chek Aviva Soln Use as directed dx: e11.22   Accu-Chek Softclix Lancets lancets Use as instructed to check blood sugars 2 times per day dx: e11.22   acetaminophen 500 MG tablet Commonly known as: TYLENOL Take 500-1,000 mg by mouth every 4 (four) hours as needed for moderate pain.   albuterol 108 (90 Base) MCG/ACT inhaler Commonly known as: VENTOLIN HFA Inhale 1-2 puffs into the lungs every 6 (six) hours as needed for wheezing or shortness of breath.   Alcohol Wipes 70 % Misc Apply 1 each topically 2 (two) times daily.   aspirin 81 MG chewable tablet Chew 1 tablet (81 mg total) by mouth daily. Start taking on: Nov 12, 2019   atorvastatin 10 MG tablet Commonly known as: LIPITOR Take 10 mg by mouth every evening.   B-complex with vitamin C tablet Take 1 tablet by mouth daily.   carvedilol 25 MG tablet Commonly known as: COREG Take 1 tablet (25 mg total) by mouth 2 (two) times daily with a meal.   diclofenac sodium 1 % Gel Commonly known as: VOLTAREN Apply 2 g topically 4 (four) times daily as needed (pain).   Droplet Pen Needles 31G X 8 MM Misc Generic drug: Insulin Pen Needle USE DAILY WITH VICTOZA AND/OR NOVOLOG.   hydrALAZINE 25 MG tablet Commonly known as: APRESOLINE Take 2 tablets (50 mg total) by mouth 2 (two) times daily. What changed:   medication strength  how much to take   hydrocortisone cream 1 % Apply 1 application topically daily as needed for itching.   insulin glargine (1 Unit Dial) 300 UNIT/ML Solostar Pen Commonly known as: TOUJEO Inject 36 Units into the skin at bedtime.   insulin lispro 100 UNIT/ML KwikPen Commonly known as: HumaLOG KwikPen Inject 0.02-0.04 mLs (2-4 Units total) into the skin 3 (three) times daily as needed (high blood sugar over 150).   isosorbide mononitrate 30 MG 24 hr tablet Commonly known as: IMDUR Take 1 tablet (30 mg total) by mouth daily. What changed:    medication strength  how much to take   lamoTRIgine 150 MG tablet Commonly known as: LaMICtal Take 1 tablet (150 mg total) by mouth 2 (two) times daily.   levETIRAcetam 500 MG tablet Commonly known as: Keppra Take 1 tablet every night What changed:   how much to take  how to take this  when to take this  additional instructions   multivitamin with minerals tablet Take 1 tablet by mouth daily.   neomycin-bacitracin-polymyxin ointment Commonly known as: NEOSPORIN Apply 1 application topically as needed for wound care.   Ozempic (1 MG/DOSE) 2 MG/1.5ML Sopn Generic drug: Semaglutide (1 MG/DOSE) INJECT 1MG INTO THE SKIN ONCE A WEEK What changed: See the new instructions.   polyethylene glycol 17 g packet Commonly known as: MIRALAX / GLYCOLAX Take 17 g by mouth daily as needed for moderate constipation or severe constipation.   potassium chloride SA 20 MEQ tablet Commonly known as: KLOR-CON Take 40 meq daily, when taking metolazone take a total of 60 meq daily What changed:  how much to take  how to take this  when to take this   sertraline 25 MG tablet Commonly known as: ZOLOFT Take 25 mg by mouth See admin instructions. Take with 50 mg to equal 75 mg at bedtime What changed: Another medication with the same name was changed. Make sure you understand how and when to take each.   sertraline 50 MG tablet Commonly known as: ZOLOFT TAKE 1 TABLET EVERY DAY What changed:   when to take this  additional instructions   STOPAIN ROLL-ON EX Apply 1 application topically daily as needed (pain).   Tinactin 1 % cream Generic drug: tolnaftate Apply 1 application topically daily as needed (foot fungus).   torsemide 20 MG tablet Commonly known as: DEMADEX Take 2 tablets (40 mg total) by mouth daily. Start taking on: Nov 12, 2019   Vitamin D-3 25 MCG (1000 UT) Caps Take 1,000 Units by mouth daily.   Zenpep 25000-79000 units Cpep Generic drug: Pancrelipase  (Lip-Prot-Amyl) Take 1-2 capsules by mouth See admin instructions. Take 2 capsules in the morning, 1 capsule with lunch, and 2 capsules in the evening.      Follow-up Information    [facility] Follow up on 12/03/2019.   Specialty: Cardiology Why: 11:00 AM [facility]  Contact information: [address] [id number] mc [address] [place]       [name], MD Follow up.   Specialty: Internal Medicine Contact information: [address] [place] [address] [phone]        Leonie Man, MD .   Specialty: Cardiology Contact information: [address] [place] [address] [place] [address] [phone]          Allergies  Allergen Reactions  . Other Anaphylaxis and Rash    Bleach  . Penicillins Hives    Did it involve swelling of the face/tongue/throat, SOB, or low BP? No Did it involve sudden or severe rash/hives, skin peeling, or any reaction on the inside of your mouth or nose? Yes Did you need to seek medical attention at a hospital or doctor's office? Yes When did it last happen?Childhood allergy  If all above answers are "NO", may proceed with cephalosporin use.    . Sulfa Antibiotics Hives  . Aspirin Other (See Comments)     On aspirin 81 mg - Rectal bleeding in dec 2018  . Codeine     Headache and makes the patient feel "off"    Consultations:  Neurology and cardiology   Procedures/Studies: EEG  Result Date: 11/09/2019 Lora Havens, MD     11/09/2019  4:03 PM Patient Name: [name] MRN: [id number] Epilepsy Attending: Lora Havens Referring Physician/Provider: Etta Quill, PA Date: 11/09/2019 Duration: 24.11 mins Patient history: This is a 74 year old female who has history of seizures and nonepileptic seizures who currently is on Lamictal and Keppra.  While patient was obtaining ultrasound today she was noted to have  seizure-like activity. EEG to evaluate for seizure  Level of alertness: Awake AEDs during EEG study: Keppra, LTG Technical aspects: This EEG study was done with scalp electrodes positioned according to the 10-20 International system of electrode placement. Electrical activity was acquired at a sampling rate of _0  and reviewed with a high frequency filter of _1  and a low frequency filter of _2 . EEG data were recorded continuously and digitally stored. Description: The posterior dominant rhythm consists of 9 Hz activity of moderate voltage (  25-35 uV) seen predominantly in posterior head regions, symmetric and reactive to eye opening and eye closing.  Hyperventilation and photic stimulation were not performed.   IMPRESSION: This study is within normal limits. No seizures or epileptiform discharges were seen throughout the recording. [name]   CT HEAD WO CONTRAST  Result Date: 11/09/2019 CLINICAL DATA:  Seizure, nontraumatic. Additional history provided: Repeated syncopal episodes, possible seizure. EXAM: CT HEAD WITHOUT CONTRAST TECHNIQUE: Contiguous axial images were obtained from the base of the skull through the vertex without intravenous contrast. COMPARISON:  Brain MRI 02/24/2019, CT angiogram head/neck 02/24/2019, noncontrast head CT 02/24/2019. FINDINGS: Brain: Redemonstrated small chronic cortically based infarct within the posterior right frontal lobe affecting the right precentral gyrus. No acute demarcated cortical infarct is identified. Mild chronic small vessel ischemic changes within the cerebral white matter were better appreciated on prior MRI 02/24/2019. There is no acute intracranial hemorrhage. No extra-axial fluid collection. No evidence of intracranial mass. No midline shift. Partially empty sella turcica. Vascular: No hyperdense vessel.  Atherosclerotic calcifications. Skull: Normal. Negative for fracture or focal lesion. Sinuses/Orbits: Visualized orbits show no acute finding.  Mild ethmoid mucosal thickening. Tiny right maxillary sinus mucous retention cyst. No significant mastoid effusion. IMPRESSION: 1. No evidence of acute intracranial abnormality. 2. Redemonstrated small chronic cortically-based posterior right frontal lobe infarct. 3. Mild chronic small vessel ischemic disease. 4. Partially empty sella turcica. This is very commonly an incidental finding, but can be associated with idiopathic intracranial hypertension. 5. Mild ethmoid sinus mucosal thickening. Electronically Signed   By: [name] DO   On: 11/09/2019 13:37   MR BRAIN WO CONTRAST  Result Date: 11/10/2019 CLINICAL DATA:  Has history of seizures. Diabetes and hypertension. Syncopal episode. Question recent seizure. EXAM: MRI HEAD WITHOUT CONTRAST TECHNIQUE: Multiplanar, multiecho pulse sequences of the brain and surrounding structures were obtained without intravenous contrast. COMPARISON:  02/24/2019 FINDINGS: Brain: Diffusion imaging does not show any acute or subacute infarction or other cause of restricted diffusion. No focal abnormality affects the brainstem or cerebellum. There are mild to moderate changes of chronic small vessel disease of the cerebral hemispheric deep and subcortical white matter. There is a small old right posterior frontal cortical and subcortical infarction, unchanged from previous. No evidence of mass lesion, hemorrhage, hydrocephalus or extra-axial collection. As noted previously, there is arachnoid herniation into the sella, which can be a normal variant or can be seen with intracranial hypertension. Vascular: Major vessels at the base of the brain show flow. Skull and upper cervical spine: Negative Sinuses/Orbits: Clear/normal Other: None IMPRESSION: No change since September 2020. No acute finding. Mild to moderate chronic small-vessel ischemic change of the white matter. Old small right posterior frontal cortical and subcortical infarction. Electronically Signed   By: Nelson Chimes M.D.   On: 11/10/2019 09:26   CARDIAC CATHETERIZATION  Result Date: 11/09/2019 1. Successful deployment of Cardiomems pressure sensor. 2. Normal RA pressure and PCWP. 3. Moderate pulmonary hypertension, PVR not significantly elevated.  Think PH is due to high cardiac output. 4. Despite high calculated cardiac output, no evidence for significant intracardiac shunt lesion.   ECHOCARDIOGRAM COMPLETE  Result Date: 11/09/2019    ECHOCARDIOGRAM REPORT   Patient Name:   [name] [facility] Date of Exam: 11/09/2019 Medical Rec #:  [id number]       Height:       61.0 in Accession #:    [id number]      Weight:       213.2 lb  Date of Birth:  [birth year]      BSA:          1.941 m Patient Age:    45 years        BP:           150/75 mmHg Patient Gender: F               HR:           57 bpm. Exam Location:  Inpatient Procedure: 2D Echo, Color Doppler and Cardiac Doppler Indications:    R55 Syncope  History:        Patient has prior history of Echocardiogram examinations, most                 recent 02/25/2019. Pulmonary HTN; Risk Factors:Hypertension,                 Diabetes, Dyslipidemia and Sleep Apnea.  Sonographer:    Raquel Sarna Senior RDCS Referring Phys: [id number] [place]  1. Left ventricular ejection fraction, by estimation, is 60 to 65%. The left ventricle has normal function. The left ventricle has no regional wall motion abnormalities. The left ventricular internal cavity size was mildly dilated. Left ventricular diastolic parameters are indeterminate.  2. Right ventricular systolic function is normal. The right ventricular size is severely enlarged. Mildly increased right ventricular wall thickness. Tricuspid regurgitation signal is inadequate for assessing PA pressure.  3. Left atrial size was moderately dilated.  4. Right atrial size was severely dilated.  5. The mitral valve is normal in structure. Trivial mitral valve regurgitation. No evidence of mitral stenosis.  6. The aortic valve is  tricuspid. Aortic valve regurgitation is not visualized. Mild aortic valve sclerosis is present, with no evidence of aortic valve stenosis.  7. Mild pulmonic stenosis.  8. The inferior vena cava is dilated in size with >50% respiratory variability, suggesting right atrial pressure of 8 mmHg. Comparison(s): No significant change from prior study. FINDINGS  Left Ventricle: Left ventricular ejection fraction, by estimation, is 60 to 65%. The left ventricle has normal function. The left ventricle has no regional wall motion abnormalities. The left ventricular internal cavity size was mildly dilated. There is  no left ventricular hypertrophy. Left ventricular diastolic parameters are indeterminate. Right Ventricle: The right ventricular size is severely enlarged. Mildly increased right ventricular wall thickness. Right ventricular systolic function is normal. Tricuspid regurgitation signal is inadequate for assessing PA pressure. Left Atrium: Left atrial size was moderately dilated. Right Atrium: Right atrial size was severely dilated. Pericardium: Trivial pericardial effusion is present. Mitral Valve: The mitral valve is normal in structure. Trivial mitral valve regurgitation. No evidence of mitral valve stenosis. Tricuspid Valve: The tricuspid valve is normal in structure. Tricuspid valve regurgitation is trivial. No evidence of tricuspid stenosis. Aortic Valve: The aortic valve is tricuspid. Aortic valve regurgitation is not visualized. Mild aortic valve sclerosis is present, with no evidence of aortic valve stenosis. There is mild calcification of the aortic valve. Pulmonic Valve: The pulmonic valve was grossly normal. Pulmonic valve regurgitation is not visualized. Mild pulmonic stenosis. Aorta: The aortic root and ascending aorta are structurally normal, with no evidence of dilitation. Venous: The inferior vena cava is dilated in size with greater than 50% respiratory variability, suggesting right atrial pressure  of 8 mmHg. IAS/Shunts: No atrial level shunt detected by color flow Doppler.  LEFT VENTRICLE PLAX 2D LVIDd:         5.60 cm  Diastology LVIDs:  3.50 cm  LV e' lateral:   6.96 cm/s LV PW:         1.10 cm  LV E/e' lateral: 9.8 LV IVS:        0.70 cm  LV e' medial:    6.20 cm/s LVOT diam:     1.80 cm  LV E/e' medial:  11.0 LV SV:         64 LV SV Index:   33 LVOT Area:     2.54 cm  RIGHT VENTRICLE RV S prime:     11.70 cm/s TAPSE (M-mode): 2.0 cm LEFT ATRIUM             Index       RIGHT ATRIUM           Index LA diam:        4.40 cm 2.27 cm/m  RA Area:     35.90 cm LA Vol (A2C):   79.5 ml 40.96 ml/m RA Volume:   149.00 ml 76.78 ml/m LA Vol (A4C):   82.9 ml 42.72 ml/m LA Biplane Vol: 82.7 ml 42.61 ml/m  AORTIC VALVE LVOT Vmax:   99.90 cm/s LVOT Vmean:  68.500 cm/s LVOT VTI:    0.252 m  AORTA Ao Root diam: 2.70 cm Ao Asc diam:  2.80 cm MITRAL VALVE MV Area (PHT): 2.80 cm    SHUNTS MV Decel Time: 271 msec    Systemic VTI:  0.25 m MV E velocity: 68.10 cm/s  Systemic Diam: 1.80 cm MV A velocity: 67.70 cm/s MV E/A ratio:  1.01 Buford Dresser MD Electronically signed by Buford Dresser MD Signature Date/Time: 11/09/2019/2:31:44 PM    Final     (Echo, Carotid, EGD, Colonoscopy, ERCP)    Subjective:  Patient resting in bed denied any complaints when I saw her anxious to go home Discharge Exam: Vitals:   11/11/19 1108 11/11/19 1112  BP: 102/62 (!) 99/57  Pulse:    Resp: 19 19  Temp:    SpO2:     Vitals:   11/11/19 1102 11/11/19 1105 11/11/19 1108 11/11/19 1112  BP: 105/75 (!) 109/58 102/62 (!) 99/57  Pulse:      Resp: (!) 24 (!) _0 Temp: 97.8 F (36.6 C)     TempSrc:      SpO2: 93%     Weight:      Height:        General: Pt is alert, awake, not in acute distress Cardiovascular: RRR, S1/S2 +, no rubs, no gallops Respiratory: CTA bilaterally, no wheezing, no rhonchi Abdominal: Soft, NT, ND, bowel sounds + Extremities: no edema, no cyanosis    The results of  significant diagnostics from this hospitalization (including imaging, microbiology, ancillary and laboratory) are listed below for reference.     Microbiology: Recent Results (from the past 240 hour(s))  SARS CORONAVIRUS 2 (TAT 6-24 HRS) Nasopharyngeal Nasopharyngeal Swab     Status: None   Collection Time: 11/06/19 12:24 PM   Specimen: Nasopharyngeal Swab  Result Value Ref Range Status   SARS Coronavirus 2 NEGATIVE NEGATIVE Final    Comment: (NOTE) SARS-CoV-2 target nucleic acids are NOT DETECTED. The SARS-CoV-2 RNA is generally detectable in upper and lower respiratory specimens during the acute phase of infection. Negative results do not preclude SARS-CoV-2 infection, do not rule out co-infections with other pathogens, and should not be used as the sole basis for treatment or other patient management decisions. Negative results must be combined with clinical observations, patient history,  and epidemiological information. The expected result is Negative. Fact Sheet for Patients: SugarRoll.be Fact Sheet for Healthcare Providers: https://www.woods-.com/ This test is not yet approved or cleared by the [place] FDA and  has been authorized for detection and/or diagnosis of SARS-CoV-2 by FDA under an Emergency Use Authorization (EUA). This EUA will remain  in effect (meaning this test can be used) for the duration of the COVID-19 declaration under Section 56 4(b)(1) of the Act, 21 U.S.C. section 360bbb-3(b)(1), unless the authorization is terminated or revoked sooner. Performed at [facility], [place] [address], [place], [place] [address]   SARS CORONAVIRUS 2 (TAT 6-24 HRS) Nasopharyngeal Nasopharyngeal Swab     Status: None   Collection Time: 11/08/19  5:19 PM   Specimen: Nasopharyngeal Swab  Result Value Ref Range Status   SARS Coronavirus 2 NEGATIVE NEGATIVE Final    Comment: (NOTE) SARS-CoV-2 target nucleic acids are NOT  DETECTED. The SARS-CoV-2 RNA is generally detectable in upper and lower respiratory specimens during the acute phase of infection. Negative results do not preclude SARS-CoV-2 infection, do not rule out co-infections with other pathogens, and should not be used as the sole basis for treatment or other patient management decisions. Negative results must be combined with clinical observations, patient history, and epidemiological information. The expected result is Negative. Fact Sheet for Patients: SugarRoll.be Fact Sheet for Healthcare Providers: https://www.woods-.com/ This test is not yet approved or cleared by the [place] FDA and  has been authorized for detection and/or diagnosis of SARS-CoV-2 by FDA under an Emergency Use Authorization (EUA). This EUA will remain  in effect (meaning this test can be used) for the duration of the COVID-19 declaration under Section 56 4(b)(1) of the Act, 21 U.S.C. section 360bbb-3(b)(1), unless the authorization is terminated or revoked sooner. Performed at [facility], [place] [address], [place], [place] [address]      Labs: BNP (last 3 results) Recent Labs    07/23/19 1539 11/09/19 0021  BNP 76.6 786.7*   Basic Metabolic Panel: Recent Labs  Lab 11/06/19 1142 11/06/19 1142 11/08/19 1306 11/08/19 1306 11/09/19 0324 11/09/19 0324 11/09/19 0935 11/09/19 1005 11/09/19 1006 11/09/19 1133 11/10/19 0402 11/11/19 0352  NA 136   < > 136   < > 140   < > 141  140 135 141  --  136 134*  K 3.8   < > 4.4   < > 3.5   < > 3.5  3.6 3.0* 3.6  --  3.5 3.7  CL 99  --  96*  --  102  --   --   --   --   --  97* 95*  CO2 27  --  28  --  31  --   --   --   --   --  29 28  GLUCOSE 117*  --  195*  --  99  --   --   --   --   --  120* 160*  BUN 22  --  16  --  14  --   --   --   --   --  12 19  CREATININE 1.16*   < > 1.05*  --  0.94  --   --   --   --  0.83 1.03* 1.32*  CALCIUM 9.4  --  9.4  --   9.0  --   --   --   --   --  8.9 8.9  MG  --   --   --   --   --   --   --   --   --   --  2.1 1.9   < > = values in this interval not displayed.   Liver Function Tests: No results for input(s): AST, ALT, ALKPHOS, BILITOT, PROT, ALBUMIN in the last 168 hours. No results for input(s): LIPASE, AMYLASE in the last 168 hours. No results for input(s): AMMONIA in the last 168 hours. CBC: Recent Labs  Lab 11/09/19 0021 11/09/19 0021 11/09/19 0324 11/09/19 0935 11/09/19 1005 11/09/19 1006 11/09/19 1417 11/10/19 0402 11/11/19 0352  WBC 4.2  --  4.2  --   --   --  5.7 5.2 5.5  HGB 10.6*   < > 10.7*   < > 11.2* 11.9* 12.0 11.5* 11.0*  HCT 34.6*   < > 34.7*   < > 33.0* 35.0* 39.2 36.6 35.5*  MCV 73.9*  --  73.7*  --   --   --  73.5* 73.5* 73.7*  PLT 265  --  261  --   --   --  281 293 264   < > = values in this interval not displayed.   Cardiac Enzymes: No results for input(s): CKTOTAL, CKMB, CKMBINDEX, TROPONINI in the last 168 hours. BNP: Invalid input(s): POCBNP CBG: Recent Labs  Lab 11/10/19 1135 11/10/19 1647 11/10/19 2105 11/11/19 0800 11/11/19 1116  GLUCAP 102* 156* 161* 140* 166*   D-Dimer No results for input(s): DDIMER in the last 72 hours. Hgb A1c Recent Labs    11/09/19 0021  HGBA1C 7.6*   Lipid Profile Recent Labs    11/09/19 0324  CHOL 142  HDL 64  LDLCALC 58  TRIG 102  CHOLHDL 2.2   Thyroid function studies Recent Labs    11/09/19 0021  TSH 1.500   Anemia work up No results for input(s): VITAMINB12, FOLATE, FERRITIN, TIBC, IRON, RETICCTPCT in the last 72 hours. Urinalysis    Component Value Date/Time   COLORURINE STRAW (A) 11/08/2019 1340   APPEARANCEUR HAZY (A) 11/08/2019 1340   LABSPEC 1.005 11/08/2019 1340   PHURINE 7.0 11/08/2019 1340   GLUCOSEU NEGATIVE 11/08/2019 1340   GLUCOSEU NEGATIVE 04/17/2016 0955   HGBUR NEGATIVE 11/08/2019 1340   BILIRUBINUR NEGATIVE 11/08/2019 1340   BILIRUBINUR negative 10/22/2018 1243   Fannin 11/08/2019 1340   PROTEINUR NEGATIVE 11/08/2019 1340   UROBILINOGEN 0.2 10/22/2018 1243   UROBILINOGEN 0.2 04/17/2016 0955   NITRITE NEGATIVE 11/08/2019 1340   LEUKOCYTESUR NEGATIVE 11/08/2019 1340   Sepsis Labs Invalid input(s): PROCALCITONIN,  WBC,  LACTICIDVEN Microbiology Recent Results (from the past 240 hour(s))  SARS CORONAVIRUS 2 (TAT 6-24 HRS) Nasopharyngeal Nasopharyngeal Swab     Status: None   Collection Time: 11/06/19 12:24 PM   Specimen: Nasopharyngeal Swab  Result Value Ref Range Status   SARS Coronavirus 2 NEGATIVE NEGATIVE Final    Comment: (NOTE) SARS-CoV-2 target nucleic acids are NOT DETECTED. The SARS-CoV-2 RNA is generally detectable in upper and lower respiratory specimens during the acute phase of infection. Negative results do not preclude SARS-CoV-2 infection, do not rule out co-infections with other pathogens, and should not be used as the sole basis for treatment or other patient management decisions. Negative results must be combined with clinical observations, patient history, and epidemiological information. The expected result is Negative. Fact Sheet for Patients: SugarRoll.be Fact Sheet for Healthcare Providers: https://www.woods-.com/ This test is not yet approved or cleared by the  [place] [employer]ural [employer] and  has been authorized for detection and/or diagnosis of SARS-CoV-2 by FDA under an Print production planner (EUA). This EUA will remain  in effect (meaning this test can be used) for the duration of the COVID-19 declaration under Section 56 4(b)(1) of the Act, 21 U.S.C. section 360bbb-3(b)(1), unless the authorization is terminated or revoked sooner. Performed at [facility], [place] [address], [place], [place] [address]   SARS CORONAVIRUS 2 (TAT 6-24 HRS) Nasopharyngeal Nasopharyngeal Swab     Status: None   Collection Time: 11/08/19  5:19 PM   Specimen: Nasopharyngeal Swab   Result Value Ref Range Status   SARS Coronavirus 2 NEGATIVE NEGATIVE Final    Comment: (NOTE) SARS-CoV-2 target nucleic acids are NOT DETECTED. The SARS-CoV-2 RNA is generally detectable in upper and lower respiratory specimens during the acute phase of infection. Negative results do not preclude SARS-CoV-2 infection, do not rule out co-infections with other pathogens, and should not be used as the sole basis for treatment or other patient management decisions. Negative results must be combined with clinical observations, patient history, and epidemiological information. The expected result is Negative. Fact Sheet for Patients: SugarRoll.be Fact Sheet for Healthcare Providers: https://www.woods-Eiliana Drone.com/ This test is not yet approved or cleared by the [place] FDA and  has been authorized for detection and/or diagnosis of SARS-CoV-2 by FDA under an Emergency Use Authorization (EUA). This EUA will remain  in effect (meaning this test can be used) for the duration of the COVID-19 declaration under Section 56 4(b)(1) of the Act, 21 U.S.C. section 360bbb-3(b)(1), unless the authorization is terminated or revoked sooner. Performed at [facility], [place] [address], [place], [place] [address]      Time coordinating discharge:  39 minutes  SIGNED:   Georgette Shell, MD  [facility] 11/11/2019, 3:10 PM Pager   If 7PM-7AM, please contact night-coverage [email or web address] Password [email or web address]

## 2019-11-11 NOTE — Plan of Care (Signed)

## 2019-11-11 NOTE — Evaluation (Signed)
Occupational Therapy Evaluation Patient Details Name: [name] MRN: [id number] DOB: Aug 08, 1945 Today's Date: 11/11/2019    History of Present Illness 74 y.o. female with  HTN, DM, COPD, OHS/OSA chronic HFpEF, epilepsy, pancreatic insufficiency who presented with syncope. Admitted for recurring syncope 11/08/19 Experienced 5 min seizure during ECHO. EEG, CT head, MRI brain-negative for any acute pathology   Clinical Impression   This 74 y/o female presents with the above. PTA pt reports independence-mod independence with ADL and functional mobility without AD. Pt overall performing ADL and mobility tasks within her room at supervision level today, including UB/LB dressing and standing grooming ADL. Further educated/discussed activity progression and energy conservation strategies in preparation for pt returning home as she is anticipating d/c today. Pt reports feeling comfortable completing ADL/mobility tasks at home and reports plans to return home with family who are able to assist with ADL/iADL PRN. Will continue to follow acutely should pt remain in-house.     Follow Up Recommendations  No OT follow up;Supervision/Assistance - 24 hour(24hr initially)    Equipment Recommendations  None recommended by OT           Precautions / Restrictions Precautions Precautions: Fall Precaution Comments: seizures Restrictions Weight Bearing Restrictions: No      Mobility Bed Mobility Overal bed mobility: Needs Assistance Bed Mobility: Supine to Sit     Supine to sit: Supervision     General bed mobility comments: use of bedrail  Transfers Overall transfer level: Needs assistance Equipment used: None Transfers: Sit to/from Stand Sit to Stand: Supervision         General transfer comment: for safety and balance     Balance Overall balance assessment: Needs assistance Sitting-balance support: Feet supported;No upper extremity supported Sitting balance-Leahy Scale: Good      Standing balance support: No upper extremity supported;Single extremity supported;During functional activity Standing balance-Leahy Scale: Fair Standing balance comment: requires support of LE on bed posteriorly to achieve initial standing balance                           ADL either performed or assessed with clinical judgement   ADL Overall ADL's : Needs assistance/impaired Eating/Feeding: Modified independent;Sitting   Grooming: Supervision/safety;Standing;Brushing hair   Upper Body Bathing: Modified independent;Sitting   Lower Body Bathing: Supervison/ safety;Sitting/lateral leans;Sit to/from stand   Upper Body Dressing : Modified independent;Sitting Upper Body Dressing Details (indicate cue type and reason): donning bra and overhead shirt  Lower Body Dressing: Supervision/safety;Sitting/lateral leans;Sit to/from stand Lower Body Dressing Details (indicate cue type and reason): donning pants/underwear, doffing socks  Toilet Transfer: Supervision/safety;Ambulation Toilet Transfer Details (indicate cue type and reason): simulated via bed to chair  Toileting- Clothing Manipulation and Hygiene: Supervision/safety;Sitting/lateral lean;Sit to/from stand       Functional mobility during ADLs: Supervision/safety General ADL Comments: pt completing dressing and grooming ADL during session, further discussed general activity progression and energy conservation after initial return home      Vision         Perception     Praxis      Pertinent Vitals/Pain Pain Assessment: No/denies pain     Hand Dominance     Extremity/Trunk Assessment Upper Extremity Assessment Upper Extremity Assessment: Generalized weakness   Lower Extremity Assessment Lower Extremity Assessment: Defer to PT evaluation   Cervical / Trunk Assessment Cervical / Trunk Assessment: Kyphotic   Communication Communication Communication: No difficulties   Cognition Arousal/Alertness:  Awake/alert Behavior During Therapy: [facility]  for tasks assessed/performed Overall Cognitive Status: Within Functional Limits for tasks assessed                                 General Comments: very pleasant    General Comments       Exercises     Shoulder Instructions      Home Living Family/patient expects to be discharged to:: Private residence Living Arrangements: Other relatives(sister and brother in-law) Available Help at Discharge: Family;Available 24 hours/day Type of Home: House Home Access: Stairs to enter [employer] of Steps: 3 Entrance Stairs-Rails: None Home Layout: One level     Bathroom Shower/Tub: Teacher, early years/pre: Standard(uses BSC over toilet) Bathroom Accessibility: No   Home Equipment: Tub bench;Walker - standard;Bedside commode;Cane - single point          Prior Functioning/Environment Level of Independence: Needs assistance  Gait / Transfers Assistance Needed: mostly uses furniture for support, limited Rollator and cane use.  ADL's / Homemaking Assistance Needed: Does her own ADLs. Completes simple meal prep and laundry tasks. Cares for her dog            OT Problem List: Decreased strength;Decreased range of motion;Decreased activity tolerance;Impaired balance (sitting and/or standing);Decreased knowledge of use of DME or AE      OT Treatment/Interventions: Self-care/ADL training;Therapeutic exercise;Neuromuscular education;Energy conservation;DME and/or AE instruction;Therapeutic activities;Patient/family education;Balance training    OT Goals(Current goals can be found in the care plan section) Acute Rehab OT Goals Patient Stated Goal: home to her dog OT Goal Formulation: With patient Time For Goal Achievement: 11/25/19 Potential to Achieve Goals: Good  OT Frequency: Min 2X/week   Barriers to D/C:            Co-evaluation              AM-PAC OT "6 Clicks" Daily Activity     Outcome  Measure Help from another person eating meals?: None Help from another person taking care of personal grooming?: A Little Help from another person toileting, which includes using toliet, bedpan, or urinal?: A Little Help from another person bathing (including washing, rinsing, drying)?: A Little Help from another person to put on and taking off regular upper body clothing?: None Help from another person to put on and taking off regular lower body clothing?: A Little 6 Click Score: 20   End of Session Nurse Communication: Mobility status  Activity Tolerance: Patient tolerated treatment well Patient left: in chair;with call bell/phone within reach;with chair alarm set  OT Visit Diagnosis: Other abnormalities of gait and mobility (R26.89);Muscle weakness (generalized) (M62.81)                Time: [id number] OT Time Calculation (min): 27 min Charges:  OT General Charges $OT Visit: 1 Visit OT Evaluation $OT Eval Moderate Complexity: 1 Mod OT Treatments $Self Care/Home Management : 8-22 mins  Lou Cal, OT Acute Rehabilitation Services Pager [phone] Office [place] 11/11/2019, 6:29 PM

## 2019-11-11 NOTE — Progress Notes (Addendum)
Patient ID: [name], female   DOB: [birth year], 74 y.o.   MRN: [id number]   Progress Note  Patient Name: [name] Date of Encounter: 11/11/2019  Primary Cardiologist: Glenetta Hew, MD   Subjective   No further seizure-like activity or syncope/ near syncope.   Complaining of sharp left sided CP w/ radiation to left face/jaw. Pleuritic in nature. Initially 10/10 at onset. It has eased off w/o any therapies, now at 7/10. EKG reviewed, no ischemic changes. Not hypertensive. Denies any associated dyspnea.    Cardiac Studies  Echo showed EF 60-65% with dilated RV.   RHC Procedural Findings: Hemodynamics (mmHg) RA mean 7 RV 59/9 PA 58/21, mean 34 PCWP mean 12 Oxygen saturations: IVC 81% RA 86% PA 86% AO 97% Cardiac Output (Fick) 16  Cardiac Index (Fick) 8.3 Cardiac Output (Thermo) 7.68 Cardiac Index (Thermo) 3.95 PVR 2.86 WU  Cardiomems placed  Inpatient Medications    Scheduled Meds: . aspirin  81 mg Oral Daily  . atorvastatin  10 mg Oral QPM  . carvedilol  25 mg Oral BID WC  . dextrose  1 ampule Intravenous Once  . enoxaparin (LOVENOX) injection  40 mg Subcutaneous Q24H  . hydrALAZINE  100 mg Oral BID  . insulin aspart  0-15 Units Subcutaneous TID WC  . insulin aspart  0-5 Units Subcutaneous QHS  . isosorbide mononitrate  60 mg Oral Daily  . lamoTRIgine  150 mg Oral BID  . levETIRAcetam  500 mg Oral BID  . lipase/protease/amylase  36,000 Units Oral Q24H  . lipase/protease/amylase   Oral BID WC  . nitroGLYCERIN      . sertraline  75 mg Oral Daily  . sodium chloride flush  3 mL Intravenous Once  . sodium chloride flush  3 mL Intravenous Q12H  . sodium chloride flush  3 mL Intravenous Q12H  . torsemide  40 mg Oral Daily   Continuous Infusions: . sodium chloride     PRN Meds: sodium chloride, acetaminophen **OR** acetaminophen, acetaminophen, acetaminophen, ondansetron **OR** ondansetron (ZOFRAN) IV, ondansetron (ZOFRAN) IV, polyethylene glycol,  senna-docusate, sodium chloride flush   Vital Signs    Vitals:   11/11/19 1102 11/11/19 1105 11/11/19 1108 11/11/19 1112  BP: 105/75 (!) 109/58 102/62 (!) 99/57  Pulse:      Resp: (!) 24 (!) 22 19 19   Temp: 97.8 F (36.6 C)     TempSrc:      SpO2: 93%     Weight:      Height:        Intake/Output Summary (Last 24 hours) at 11/11/2019 1229 Last data filed at 11/11/2019 0900 Gross per 24 hour  Intake 240 ml  Output --  Net 240 ml   Last 3 Weights 11/11/2019 11/10/2019 11/09/2019  Weight (lbs) 209 lb 5.6 oz 211 lb 3.9 oz 213 lb 3 oz  Weight (kg) 94.961 kg 95.82 kg 96.7 kg      Telemetry    NSR 60s   Physical Exam   General:  Well appearing elderly AAM. No respiratory difficulty HEENT: normal Neck: supple. no JVD. Carotids 2+ bilat; no bruits. No lymphadenopathy or thyromegaly appreciated. Cor: PMI nondisplaced. Regular rate & rhythm. No rubs, gallops or murmurs. Lungs: clear Abdomen: soft, nontender, nondistended. No hepatosplenomegaly. No bruits or masses. Good bowel sounds. Extremities: no cyanosis, clubbing, rash, edema Neuro: alert & oriented x 3, cranial nerves grossly intact. moves all 4 extremities w/o difficulty. Affect pleasant.    Labs    High Sensitivity  Troponin:   Recent Labs  Lab 11/08/19 1306 11/08/19 1549 11/09/19 0021  TROPONINIHS 8 7 8       Chemistry Recent Labs  Lab 11/09/19 0324 11/09/19 0324 11/09/19 0935 11/09/19 1006 11/09/19 1133 11/10/19 0402 11/11/19 0352  NA 140  --    < > 141  --  136 134*  K 3.5  --    < > 3.6  --  3.5 3.7  CL 102  --   --   --   --  97* 95*  CO2 31  --   --   --   --  29 28  GLUCOSE 99  --   --   --   --  120* 160*  BUN 14  --   --   --   --  12 19  CREATININE 0.94   < >  --   --  0.83 1.03* 1.32*  CALCIUM 9.0  --   --   --   --  8.9 8.9  GFRNONAA >60   < >  --   --  >60 55* 40*  GFRAA >60   < >  --   --  >60 >60 47*  ANIONGAP 7  --   --   --   --  10 11   < > = values in this interval not displayed.      Hematology Recent Labs  Lab 11/09/19 1417 11/10/19 0402 11/11/19 0352  WBC 5.7 5.2 5.5  RBC 5.33* 4.98 4.82  HGB 12.0 11.5* 11.0*  HCT 39.2 36.6 35.5*  MCV 73.5* 73.5* 73.7*  MCH 22.5* 23.1* 22.8*  MCHC 30.6 31.4 31.0  RDW 16.7* 16.5* 16.5*  PLT 281 293 264    BNP Recent Labs  Lab 11/09/19 0021  BNP 116.6*     DDimer No results for input(s): DDIMER in the last 168 hours.   Radiology    EEG  Result Date: 11/09/2019 Lora Havens, MD     11/09/2019  4:03 PM Patient Name: [name] MRN: [id number] Epilepsy Attending: Lora Havens Referring Physician/Provider: Etta Quill, PA Date: 11/09/2019 Duration: 24.11 mins Patient history: This is a 74 year old female who has history of seizures and nonepileptic seizures who currently is on Lamictal and Keppra.  While patient was obtaining ultrasound today she was noted to have seizure-like activity. EEG to evaluate for seizure  Level of alertness: Awake AEDs during EEG study: Keppra, LTG Technical aspects: This EEG study was done with scalp electrodes positioned according to the 10-20 International system of electrode placement. Electrical activity was acquired at a sampling rate of 500Hz  and reviewed with a high frequency filter of 70Hz  and a low frequency filter of 1Hz . EEG data were recorded continuously and digitally stored. Description: The posterior dominant rhythm consists of 9 Hz activity of moderate voltage (25-35 uV) seen predominantly in posterior head regions, symmetric and reactive to eye opening and eye closing.  Hyperventilation and photic stimulation were not performed.   IMPRESSION: This study is within normal limits. No seizures or epileptiform discharges were seen throughout the recording. [name]   CT HEAD WO CONTRAST  Result Date: 11/09/2019 CLINICAL DATA:  Seizure, nontraumatic. Additional history provided: Repeated syncopal episodes, possible seizure. EXAM: CT HEAD WITHOUT CONTRAST TECHNIQUE:  Contiguous axial images were obtained from the base of the skull through the vertex without intravenous contrast. COMPARISON:  Brain MRI 02/24/2019, CT angiogram head/neck 02/24/2019, noncontrast head CT 02/24/2019. FINDINGS: Brain: Redemonstrated small chronic  cortically based infarct within the posterior right frontal lobe affecting the right precentral gyrus. No acute demarcated cortical infarct is identified. Mild chronic small vessel ischemic changes within the cerebral white matter were better appreciated on prior MRI 02/24/2019. There is no acute intracranial hemorrhage. No extra-axial fluid collection. No evidence of intracranial mass. No midline shift. Partially empty sella turcica. Vascular: No hyperdense vessel.  Atherosclerotic calcifications. Skull: Normal. Negative for fracture or focal lesion. Sinuses/Orbits: Visualized orbits show no acute finding. Mild ethmoid mucosal thickening. Tiny right maxillary sinus mucous retention cyst. No significant mastoid effusion. IMPRESSION: 1. No evidence of acute intracranial abnormality. 2. Redemonstrated small chronic cortically-based posterior right frontal lobe infarct. 3. Mild chronic small vessel ischemic disease. 4. Partially empty sella turcica. This is very commonly an incidental finding, but can be associated with idiopathic intracranial hypertension. 5. Mild ethmoid sinus mucosal thickening. Electronically Signed   By: [name] DO   On: 11/09/2019 13:37   MR BRAIN WO CONTRAST  Result Date: 11/10/2019 CLINICAL DATA:  Has history of seizures. Diabetes and hypertension. Syncopal episode. Question recent seizure. EXAM: MRI HEAD WITHOUT CONTRAST TECHNIQUE: Multiplanar, multiecho pulse sequences of the brain and surrounding structures were obtained without intravenous contrast. COMPARISON:  02/24/2019 FINDINGS: Brain: Diffusion imaging does not show any acute or subacute infarction or other cause of restricted diffusion. No focal abnormality affects the  brainstem or cerebellum. There are mild to moderate changes of chronic small vessel disease of the cerebral hemispheric deep and subcortical white matter. There is a small old right posterior frontal cortical and subcortical infarction, unchanged from previous. No evidence of mass lesion, hemorrhage, hydrocephalus or extra-axial collection. As noted previously, there is arachnoid herniation into the sella, which can be a normal variant or can be seen with intracranial hypertension. Vascular: Major vessels at the base of the brain show flow. Skull and upper cervical spine: Negative Sinuses/Orbits: Clear/normal Other: None IMPRESSION: No change since September 2020. No acute finding. Mild to moderate chronic small-vessel ischemic change of the white matter. Old small right posterior frontal cortical and subcortical infarction. Electronically Signed   By: Nelson Chimes M.D.   On: 11/10/2019 09:26    Cardiac Studies   Cardiac monitor 02/27/09:  Predominant rhythm sinus with rates ranging from 52 to 92 bpm. Average 65 bpm.  Rare, <1% PACs or PVCs. No couplets or triplets noted, and very rare ventricular bigeminy noted.  Total of 20 short bursts of PSVT/PAT ranging from 5-10 beats in heart rate from 103 to 132 bpm.  Most of patient's symptoms were noted with sinus rhythm, or sinus rhythm with rare PACs/PVCs. Symptoms not noted during episodes of PSVT or PAT.  No atrial fibrillation, atrial flutter or prolonged PSVT/PAT noted.  No bradycardia events noted. Sinus bradycardia noted during sleeping hours.  Overall, relatively reassuring study. There were several short little bursts of fast heart rates, but not noted on symptoms. Probably not long enough to make patient feel lightheaded, dizzy or short of breath. Would only be felt as a quick little bursts of fluttering.  Would not be inclined to treat.  RHC/RHC 04/06/19:  Hemodynamic findings consistent with mild pulmonary hypertension -with  moderately elevated LVEDP  There is no aortic valve stenosis.  Angiographically normal coronary arteries with LEFT-LAD dominant system  SUMMARY  Angiographically normal coronary arteries with essentially LAD dominant system.  Right Heart Cath pressures show mild-moderate pulmonary hypertension with elevated LVEDP suggesting combination of diastolic dysfunction related as well as obesity hypoventilation related pulmonary  hypertension  Normal/hyperdynamic cardiac output and index.   RECOMMENDATIONS  Discharge home after bedrest.  For now continue current medications as she seems to be adequately diuresed. We will need to avoid overdiuresis in the future. -->Shorter courses of increased Lasix for worsening edema or dyspnea.  Will review with advanced heart failure team.  TTE 02/25/19: 1. The left ventricle has low normal systolic function, with an ejection  fraction of 50-55%. The cavity size was normal. Left ventricular diastolic  Doppler parameters are consistent with impaired relaxation. No evidence of  left ventricular regional wall  motion abnormalities.  2. Although TAPSE is measured as normal, the right ventricle visually  appears to have moderate to severely reduced systolic function. The cavity  was severely enlarged. There is no increase in right ventricular wall  thickness. Right ventricular systolic  pressure could not be assessed.  3. Left atrial size was mildly dilated.  4. Hypermobile interatrial septum.  5. Right atrial size was severely dilated.  6. The aorta is normal unless otherwise noted.  7. The inferior vena cava was dilated in size with <50% respiratory  variability.   Patient Profile     74 y.o. female with a hx of OHS/OSA, CVA, seizure disorder, type 2 diabetes, hypertension, hyperlipidemia, chronic diastolic heart failure, pulmonary hypertension who is being seen today for the evaluation of syncope.  Assessment & Plan    1. Syncope:  She presented after an episode of syncope. Has had in the past.  Prior heart monitoring has not shown significant arrhythmia but did not catch a syncopal event.  She had an episode of jerking with LOC while in hospital, this was not associated with an arrhythmia.  Given history of seizure disorder, suspected to be a seizure.  Cruz Condon monitor was not placed yesterday given suspicion for seizure (no arrhythmia on monitoring at time of syncope).  2. Chronic diastolic heart failure: Echo this admission with EF 60-65%, severely dilated RV. RHC/LHC in 10/20 showed mild-moderate pulmonary venous hypertension and no significant coronary disease. Cause of RV dysfunction is not clear, possibly due to diastolic CHF + OHS/OSA.  V/Q scan in 2/21 did not show acute or chronic PE, PFTs in 5/21 were normal. RHC yesterday showed normal right and left heart filling pressures on current dose of torsemide. High output on RHC 5/17 but no evidence for intracardiac shunt on shunt run.  She is not volume overloaded on exam.  - Continue torsemide 40 mg daily.  - She now has a Cardiomems device.  3. Pulmonary hypertension: RHC this admission with moderate pulmonary hypertension, suspect group 2/group 3 in setting of diastolic CHF, OHS/OSA, and high cardiac output.   - I do not think that there is a role for selective pulmonary vasodilators.  4. Hypertension - BP soft today, low 0000000 systolic. -  Continue coreg 25 mg BID, reduce hydralazine to 50 mg BID, reduce Imdur to 30 daily  5. OHS/OSA: on CPAP but no oxygen 6. Seizure d/o: Has had both seizures and pseudoseizures per notes.  Suspect she had one or the other this admission.  - EEG completed and negative  - Per neurology, no changes in antiepileptic medications at this time.  - no driving per [place] DMV statutes  7. Old CVA: MRI with evidence for prior CVA (nothing acute).  - Continue statin. - Add ASA 81.   - Further workup per neurology => may want to still consider LINQ  monitor to r/o atrial fibrillation given prior CVA.  8. Chest Pain   - symptom characteristics c/w noncardiac CP. EKG w/o ischemic abnormalities. She had fairly recent [facility] 03/2019 that showed normal coronaries. Symptoms ocurred after eating. ? Gi etiology/ acid reflux - will give trial of GI cocktail  - no further cardiac w/u needed at this time.  9. AKI - SCr bump from 1.03>>1.32. - BP soft, SBP in low 90s - reduce hydralazine to 50 mg bid and Imdur to 30 mg daily  - repeat BMP at clinic f/u   [facility] for d/c from our standpoint. We will arrange f/u and place appt info in AVS  Cardiac Meds for Home ASA 81(for h/o CVA) Atorvastatin 10 mg qhs Coreg 25 mg bid Hydralazine 50 mg bid (reduced dose) Imdur 30 mg qd (reduced dose)  Torsemide 40 mg qd   Signed, Lyda Jester, PA-C  11/11/2019, 12:29 PM    Patient seen with PA, agree with the above note.  OK for discharge from our standpoint.  See medication regimen above. We will follow volume status at home via Cardiomems.   Loralie Champagne 11/11/2019

## 2019-11-12 ENCOUNTER — Ambulatory Visit: Payer: Self-pay

## 2019-11-12 ENCOUNTER — Telehealth: Payer: Self-pay

## 2019-11-12 DIAGNOSIS — I5032 Chronic diastolic (congestive) heart failure: Secondary | ICD-10-CM

## 2019-11-12 DIAGNOSIS — E1122 Type 2 diabetes mellitus with diabetic chronic kidney disease: Secondary | ICD-10-CM

## 2019-11-12 NOTE — Telephone Encounter (Signed)
Transition Care Management Follow-up Telephone Call  Date of discharge and from where: [facility] 11/11/[facility]  How have you been since you were released from the hospital? better  Any questions or concerns? No   Items Reviewed:  Did the pt receive and understand the discharge instructions provided? Yes   Medications obtained and verified? Yes   Any new allergies since your discharge? No   Dietary orders reviewed? Yes  Do you have support at home? Yes   Other (ie: DME, Home Health, etc) n/a  Functional Questionnaire: (I = Independent and D = Dependent) ADL's: I  Bathing/Dressing- I   Meal Prep- I  Eating- I  Maintaining continence- I  Transferring/Ambulation- I  Managing Meds- D   Follow up appointments reviewed:    PCP Hospital f/u appt confirmed? Yes  Scheduled to see Dr. Baird Cancer on 11/17/2019 @ 12:00..  Are transportation arrangements needed? No   If their condition worsens, is the pt aware to call  their PCP or go to the ED? Yes  Was the patient provided with contact information for the PCP's office or ED? Yes  Was the pt encouraged to call back with questions or concerns? Yes

## 2019-11-12 NOTE — Telephone Encounter (Signed)
This nurse attempted to call patient for transition of care call in order to schedule hospital follow up appointment. Call went directly to voicemail. Message left for patient to return call. Will attempt to call again later today.

## 2019-11-12 NOTE — Chronic Care Management (AMB) (Signed)
  Chronic Care Management   Outreach Note  11/12/2019 Name: [name] MRN: [id number] DOB: February 28, 1946  Referred by: [name], MD Reason for referral : Care Coordination   SW placed an unsuccessful outbound call to the patient to assist with care coordination needs following recent inpatient stay. SW left a HIPAA compliant voice message requesting a return call.  Follow Up Plan: The care management team will reach out to the patient again over the next 10 days.    Daneen Schick, BSW, CDP Social Worker, Certified Dementia Practitioner [place] [facility] [place] [facility] [phone]

## 2019-11-15 DIAGNOSIS — I130 Hypertensive heart and chronic kidney disease with heart failure and stage 1 through stage 4 chronic kidney disease, or unspecified chronic kidney disease: Secondary | ICD-10-CM | POA: Diagnosis not present

## 2019-11-15 DIAGNOSIS — N182 Chronic kidney disease, stage 2 (mild): Secondary | ICD-10-CM | POA: Diagnosis not present

## 2019-11-15 DIAGNOSIS — E1122 Type 2 diabetes mellitus with diabetic chronic kidney disease: Secondary | ICD-10-CM | POA: Diagnosis not present

## 2019-11-15 DIAGNOSIS — G40909 Epilepsy, unspecified, not intractable, without status epilepticus: Secondary | ICD-10-CM | POA: Diagnosis not present

## 2019-11-15 DIAGNOSIS — J9611 Chronic respiratory failure with hypoxia: Secondary | ICD-10-CM | POA: Diagnosis not present

## 2019-11-15 DIAGNOSIS — D631 Anemia in chronic kidney disease: Secondary | ICD-10-CM | POA: Diagnosis not present

## 2019-11-15 DIAGNOSIS — I69354 Hemiplegia and hemiparesis following cerebral infarction affecting left non-dominant side: Secondary | ICD-10-CM | POA: Diagnosis not present

## 2019-11-15 DIAGNOSIS — I5082 Biventricular heart failure: Secondary | ICD-10-CM | POA: Diagnosis not present

## 2019-11-15 DIAGNOSIS — I5032 Chronic diastolic (congestive) heart failure: Secondary | ICD-10-CM | POA: Diagnosis not present

## 2019-11-15 DIAGNOSIS — J449 Chronic obstructive pulmonary disease, unspecified: Secondary | ICD-10-CM | POA: Diagnosis not present

## 2019-11-17 ENCOUNTER — Encounter: Payer: Self-pay | Admitting: Internal Medicine

## 2019-11-17 ENCOUNTER — Ambulatory Visit (INDEPENDENT_AMBULATORY_CARE_PROVIDER_SITE_OTHER): Payer: Medicare HMO | Admitting: Internal Medicine

## 2019-11-17 ENCOUNTER — Other Ambulatory Visit: Payer: Self-pay

## 2019-11-17 VITALS — BP 116/70 | HR 61 | Temp 98.4°F | Ht 61.0 in | Wt 213.0 lb

## 2019-11-17 DIAGNOSIS — N182 Chronic kidney disease, stage 2 (mild): Secondary | ICD-10-CM | POA: Diagnosis not present

## 2019-11-17 DIAGNOSIS — I110 Hypertensive heart disease with heart failure: Secondary | ICD-10-CM

## 2019-11-17 DIAGNOSIS — I5032 Chronic diastolic (congestive) heart failure: Secondary | ICD-10-CM | POA: Diagnosis not present

## 2019-11-17 DIAGNOSIS — F339 Major depressive disorder, recurrent, unspecified: Secondary | ICD-10-CM | POA: Diagnosis not present

## 2019-11-17 DIAGNOSIS — R55 Syncope and collapse: Secondary | ICD-10-CM

## 2019-11-17 DIAGNOSIS — Z794 Long term (current) use of insulin: Secondary | ICD-10-CM

## 2019-11-17 DIAGNOSIS — Z79899 Other long term (current) drug therapy: Secondary | ICD-10-CM

## 2019-11-17 DIAGNOSIS — R413 Other amnesia: Secondary | ICD-10-CM

## 2019-11-17 DIAGNOSIS — E1122 Type 2 diabetes mellitus with diabetic chronic kidney disease: Secondary | ICD-10-CM | POA: Diagnosis not present

## 2019-11-17 MED ORDER — ATORVASTATIN CALCIUM 10 MG PO TABS: 10.0000 mg | ORAL_TABLET | Freq: Every evening | ORAL | 2 refills | Status: DC

## 2019-11-17 MED ORDER — SERTRALINE HCL 50 MG PO TABS: 50.0000 mg | ORAL_TABLET | Freq: Every day | ORAL | 2 refills | Status: DC

## 2019-11-18 ENCOUNTER — Ambulatory Visit: Payer: Self-pay

## 2019-11-18 ENCOUNTER — Telehealth: Payer: Self-pay

## 2019-11-18 DIAGNOSIS — N182 Chronic kidney disease, stage 2 (mild): Secondary | ICD-10-CM | POA: Diagnosis not present

## 2019-11-18 DIAGNOSIS — E1122 Type 2 diabetes mellitus with diabetic chronic kidney disease: Secondary | ICD-10-CM | POA: Diagnosis not present

## 2019-11-18 DIAGNOSIS — I5032 Chronic diastolic (congestive) heart failure: Secondary | ICD-10-CM

## 2019-11-18 DIAGNOSIS — D631 Anemia in chronic kidney disease: Secondary | ICD-10-CM | POA: Diagnosis not present

## 2019-11-18 DIAGNOSIS — G40909 Epilepsy, unspecified, not intractable, without status epilepticus: Secondary | ICD-10-CM | POA: Diagnosis not present

## 2019-11-18 DIAGNOSIS — I130 Hypertensive heart and chronic kidney disease with heart failure and stage 1 through stage 4 chronic kidney disease, or unspecified chronic kidney disease: Secondary | ICD-10-CM | POA: Diagnosis not present

## 2019-11-18 DIAGNOSIS — J449 Chronic obstructive pulmonary disease, unspecified: Secondary | ICD-10-CM | POA: Diagnosis not present

## 2019-11-18 DIAGNOSIS — I5082 Biventricular heart failure: Secondary | ICD-10-CM | POA: Diagnosis not present

## 2019-11-18 DIAGNOSIS — J9611 Chronic respiratory failure with hypoxia: Secondary | ICD-10-CM | POA: Diagnosis not present

## 2019-11-18 LAB — BMP8+EGFR
BUN/Creatinine Ratio: 16 (ref 12–28)
BUN: 16 mg/dL (ref 8–27)
CO2: 29 mmol/L (ref 20–29)
Calcium: 10 mg/dL (ref 8.7–10.3)
Chloride: 95 mmol/L — ABNORMAL LOW (ref 96–106)
Creatinine, Ser: 1.01 mg/dL — ABNORMAL HIGH (ref 0.57–1.00)
GFR calc Af Amer: 65 mL/min/{1.73_m2} (ref >59)
GFR calc non Af Amer: 56 mL/min/{1.73_m2} — ABNORMAL LOW (ref >59)
Glucose: 95 mg/dL (ref 65–99)
Potassium: 4.5 mmol/L (ref 3.5–5.2)
Sodium: 140 mmol/L (ref 134–144)

## 2019-11-18 LAB — CBC
Hematocrit: 36 % (ref 34.0–46.6)
Hemoglobin: 11.2 g/dL (ref 11.1–15.9)
MCH: 22.9 pg — ABNORMAL LOW (ref 26.6–33.0)
MCHC: 31.1 g/dL — ABNORMAL LOW (ref 31.5–35.7)
MCV: 74 fL — ABNORMAL LOW (ref 79–97)
Platelets: 309 10*3/uL (ref 150–450)
RBC: 4.9 x10E6/uL (ref 3.77–5.28)
RDW: 16.1 % — ABNORMAL HIGH (ref 11.7–15.4)
WBC: 5.3 10*3/uL (ref 3.4–10.8)

## 2019-11-18 NOTE — Chronic Care Management (AMB) (Signed)
  Chronic Care Management   Outreach Note  11/18/2019 Name: [name] MRN: [id number] DOB: Jul 03, 1945  Referred by: [name], MD Reason for referral : Care Coordination   SW placed a second unsuccessful outbound call to the patient to assist with care coordination needs. SW left a HIPAA compliant voice message requesting a return call.  Follow Up Plan: SW will attempt a third and final outreach over the next 14 days.  Daneen Schick, BSW, CDP Social Worker, Certified Dementia Practitioner [place] [facility] [place] [facility] [phone]

## 2019-11-19 ENCOUNTER — Ambulatory Visit: Payer: Self-pay

## 2019-11-19 DIAGNOSIS — E1122 Type 2 diabetes mellitus with diabetic chronic kidney disease: Secondary | ICD-10-CM

## 2019-11-19 NOTE — Chronic Care Management (AMB) (Signed)
Chronic Care Management    Social Work Follow Up Note  11/19/2019 Name: [name] MRN: [id number] DOB: 13-Sep-1945  [name] is a 74 y.o. year old female who is a primary care patient of [name], MD. The CCM team was consulted for assistance with care coordination.   Review of patient status, including review of consultants reports, other relevant assessments, and collaboration with appropriate care team members and the patient's provider was performed as part of comprehensive patient evaluation and provision of chronic care management services.    SW Outreached the patient due to [facility] Red Alert:  Yesterday's Red Flags: 1   GENERAL DISCHARGE FOR FOLLOW-UP MC-6E PROGRESSIVE CARE-GD  Encounter ID: [id number] Patient ID: [id number]  Series Access Code: [id number] Enrolled Date: 11/13/19  Call Language: English Start Date: 11/14/19  Details Tue 11/17/19 Sat 11/14/19  [phone] Day # 4 Day # 1  PATIENT GENERAL DISCHARGE FOR FOLLOW-UP: [place] GENERAL DISCHARGE FOR FOLLOW-UP: DISCHARGE, FOLLOW-UP, MEDS, WOUND CARE  Time of Last Call Attempt 4:06 PM 10:13 AM  Who reached Patient Patient  Unfilled prescriptions?   Yes  Other questions/problems? Yes No  [facility] Program: PATIENT SATISFACTION: AFTER DISCHARGE EXPECTATIONS   Issued   SW reviewed EMMI Red Alert with the patient in collaboration with Consulting civil engineer. Patient reports on day 1, 11/14/19 she selected "yes" to unfilled prescriptions due to needing a refill of Lipitor. Patient reports she selected "yes" to medication questions on 11/17/19 but no longer has questions pertaining to medication administration as her provider, Dr Baird Cancer, answered these questions during [name] on 5/25.   SW reviewed chart to note during 5/25 visit, Dr. Baird Cancer refilled Lipitor prescription for the patient.     Outpatient Encounter Medications as of 11/19/2019  Medication Sig  . Accu-Chek Softclix Lancets lancets Use as instructed to check blood  sugars 2 times per day dx: e11.22  . acetaminophen (TYLENOL) 500 MG tablet Take 500-1,000 mg by mouth every 4 (four) hours as needed for moderate pain.   Marland Kitchen albuterol (VENTOLIN HFA) 108 (90 Base) MCG/ACT inhaler Inhale 1-2 puffs into the lungs every 6 (six) hours as needed for wheezing or shortness of breath.  Marland Kitchen aspirin 81 MG chewable tablet Chew 1 tablet (81 mg total) by mouth daily. (Patient not taking: Reported on 11/17/2019)  . atorvastatin (LIPITOR) 10 MG tablet Take 1 tablet (10 mg total) by mouth every evening.  . B Complex-C (B-COMPLEX WITH VITAMIN C) tablet Take 1 tablet by mouth daily.  . Blood Glucose Calibration (ACCU-CHEK AVIVA) SOLN Use as directed dx: e11.22  . Blood Glucose Monitoring Suppl (ACCU-CHEK AVIVA PLUS) w/Device KIT Use as directed to check blood sugars 2 times per day dx: e11.22  . carvedilol (COREG) 25 MG tablet Take 1 tablet (25 mg total) by mouth 2 (two) times daily with a meal.  . Cholecalciferol (VITAMIN D-3) 1000 units CAPS Take 1,000 Units by mouth daily.  . diclofenac sodium (VOLTAREN) 1 % GEL Apply 2 g topically 4 (four) times daily as needed (pain).  . DROPLET PEN NEEDLES 31G X 8 MM MISC USE DAILY WITH VICTOZA AND/OR NOVOLOG.   . furosemide (LASIX) 80 MG tablet Take 80 mg by mouth. 1 tab Monday - Friday  . glucose blood (ACCU-CHEK AVIVA PLUS) test strip Use as instructed to check blood sugars 2 times per day dx: e11.22  . hydrALAZINE (APRESOLINE) 25 MG tablet Take 2 tablets (50 mg total) by mouth 2 (two) times daily.  . hydrocortisone cream  1 % Apply 1 application topically daily as needed for itching.  . insulin glargine (LANTUS) 100 UNIT/ML injection 5 units qhs  . insulin lispro (HUMALOG KWIKPEN) 100 UNIT/ML KwikPen Inject 0.02-0.04 mLs (2-4 Units total) into the skin 3 (three) times daily as needed (high blood sugar over 150).  . Isopropyl Alcohol (ALCOHOL WIPES) 70 % MISC Apply 1 each topically 2 (two) times daily.  . isosorbide mononitrate (IMDUR) 30 MG 24  hr tablet Take 1 tablet (30 mg total) by mouth daily. (Patient taking differently: Take 60 mg by mouth daily. )  . lamoTRIgine (LAMICTAL) 150 MG tablet Take 1 tablet (150 mg total) by mouth 2 (two) times daily.  Marland Kitchen levETIRAcetam (KEPPRA) 500 MG tablet Take 1 tablet (500 mg total) by mouth 2 (two) times daily. (Patient taking differently: Take 500 mg by mouth at bedtime. )  . Menthol, Topical Analgesic, (STOPAIN ROLL-ON EX) Apply 1 application topically daily as needed (pain).  . Multiple Vitamins-Minerals (MULTIVITAMIN WITH MINERALS) tablet Take 1 tablet by mouth daily.  Marland Kitchen neomycin-bacitracin-polymyxin (NEOSPORIN) ointment Apply 1 application topically as needed for wound care.  Marland Kitchen OZEMPIC, 1 MG/DOSE, 2 MG/1.5ML SOPN INJECT 1MG INTO THE SKIN ONCE A WEEK (Patient taking differently: Inject 1 mg into the muscle once a week. Thursday)  . Pancrelipase, Lip-Prot-Amyl, (ZENPEP) 25000-79000 units CPEP Take 1-2 capsules by mouth See admin instructions. Take 2 capsules in the morning, 1 capsule with lunch, and 2 capsules in the evening.  . polyethylene glycol (MIRALAX / GLYCOLAX) 17 g packet Take 17 g by mouth daily as needed for moderate constipation or severe constipation.  . potassium chloride SA (KLOR-CON) 20 MEQ tablet Take 40 meq daily, when taking metolazone take a total of 60 meq daily (Patient taking differently: Take 40-60 mEq by mouth See admin instructions. Take 40 meq daily, when taking metolazone take a total of 60 meq daily )  . sertraline (ZOLOFT) 50 MG tablet Take 1 tablet (50 mg total) by mouth daily.  Marland Kitchen tolnaftate (TINACTIN) 1 % cream Apply 1 application topically daily as needed (foot fungus).   [name] SOLOSTAR 300 UNIT/ML Solostar Pen Inject 34 Units into the skin at bedtime.   No facility-administered encounter medications on file as of 11/19/2019.      Follow Up Plan: SW referred to embedded PharmD to address ongoing medication adherence needs. The patient is aware of this referral. SW  collaboration with RN Care Manager regarding patient answers to [employer] and plans for PharmD to engage.   Daneen Schick, BSW, CDP Social Worker, Certified Dementia Practitioner [place] [facility] [place] [facility] [phone]

## 2019-11-23 NOTE — Patient Instructions (Signed)
Seizure, Adult °A seizure is a sudden burst of abnormal electrical activity in the brain. Seizures usually last from 30 seconds to 2 minutes. They can cause many different symptoms. °Usually, seizures are not harmful unless they last a long time. °What are the causes? °Common causes of this condition include: °· Fever or infection. °· Conditions that affect the brain, such as: °? A brain abnormality that you were born with. °? A brain or head injury. °? Bleeding in the brain. °? A tumor. °? Stroke. °? Brain disorders such as autism or cerebral palsy. °· Low blood sugar. °· Conditions that are passed from parent to child (are inherited). °· Problems with substances, such as: °? Having a reaction to a drug or a medicine. °? Suddenly stopping the use of a substance (withdrawal). °In some cases, the cause may not be known. A person who has repeated seizures over time without a clear cause has a condition called epilepsy. °What increases the risk? °You are more likely to get this condition if you have: °· A family history of epilepsy. °· Had a seizure in the past. °· A brain disorder. °· A history of head injury, lack of oxygen at birth, or strokes. °What are the signs or symptoms? °There are many types of seizures. The symptoms vary depending on the type of seizure you have. Examples of symptoms during a seizure include: °· Shaking (convulsions). °· Stiffness in the body. °· Passing out (losing consciousness). °· Head nodding. °· Staring. °· Not responding to sound or touch. °· Loss of bladder control and bowel control. °Some people have symptoms right before and right after a seizure happens. °Symptoms before a seizure may include: °· Fear. °· Worry (anxiety). °· Feeling like you may vomit (nauseous). °· Feeling like the room is spinning (vertigo). °· Feeling like you saw or heard something before (déjà vu). °· Odd tastes or smells. °· Changes in how you see. You may see flashing lights or spots. °Symptoms after a  seizure happens can include: °· Confusion. °· Sleepiness. °· Headache. °· Weakness on one side of the body. °How is this treated? °Most seizures will stop on their own in under 5 minutes. In these cases, no treatment is needed. Seizures that last longer than 5 minutes will usually need treatment. Treatment can include: °· Medicines given through an IV tube. °· Avoiding things that are known to cause your seizures. These can include medicines that you take for another condition. °· Medicines to treat epilepsy. °· Surgery to stop the seizures. This may be needed if medicines do not help. °Follow these instructions at home: °Medicines °· Take over-the-counter and prescription medicines only as told by your doctor. °· Do not eat or drink anything that may keep your medicine from working, such as alcohol. °Activity °· Do not do any activities that would be dangerous if you had another seizure, like driving or swimming. Wait until your doctor says it is safe for you to do them. °· If you live in the [place] ask your local DMV (department of motor vehicles) when you can drive. °· Get plenty of rest. °Teaching others °Teach friends and family what to do when you have a seizure. They should: °· Lay you on the ground. °· Protect your head and body. °· Loosen any tight clothing around your neck. °· Turn you on your side. °· Not hold you down. °· Not put anything into your mouth. °· Know whether or not you need emergency care. °· Stay   with you until you are better. ° °General instructions °· Contact your doctor each time you have a seizure. °· Avoid anything that gives you seizures. °· Keep a seizure diary. Write down: °? What you think caused each seizure. °? What you remember about each seizure. °· Keep all follow-up visits as told by your doctor. This is important. °Contact a doctor if: °· You have another seizure. °· You have seizures more often. °· There is any change in what happens during your seizures. °· You keep having  seizures with treatment. °· You have symptoms of being sick or having an infection. °Get help right away if: °· You have a seizure that: °? Lasts longer than 5 minutes. °? Is different than seizures you had before. °? Makes it harder to breathe. °? Happens after you hurt your head. °· You have any of these symptoms after a seizure: °? Not being able to speak. °? Not being able to use a part of your body. °? Confusion. °? A bad headache. °· You have two or more seizures in a row. °· You do not wake up right after a seizure. °· You get hurt during a seizure. °These symptoms may be an emergency. Do not wait to see if the symptoms will go away. Get medical help right away. Call your local emergency services (911 in the [place] Do not drive yourself to the hospital. °Summary °· Seizures usually last from 30 seconds to 2 minutes. Usually, they are not harmful unless they last a long time. °· Do not eat or drink anything that may keep your medicine from working, such as alcohol. °· Teach friends and family what to do when you have a seizure. °· Contact your doctor each time you have a seizure. °This information is not intended to replace advice given to you by your health care provider. Make sure you discuss any questions you have with your health care provider. °Document Revised: 08/29/2018 Document Reviewed: 08/29/2018 °Elsevier Patient Education © 2020 [employer] ° °

## 2019-11-23 NOTE — Progress Notes (Signed)
This visit occurred during the SARS-CoV-2 public health emergency.  Safety protocols were in place, including screening questions prior to the visit, additional usage of staff PPE, and extensive cleaning of exam room while observing appropriate contact time as indicated for disinfecting solutions.  Subjective:     Patient ID: [name] , female    DOB: 05/01/1946 , 74 y.o.   MRN: [id number]   Chief Complaint  Patient presents with  . Hospitalization Follow-up    HPI  She presents today for hospital f/u. She was admitted to [facility] on 5/16 for further evaluation of syncope.  Workup included Cardiology and Neuro evaluation.  Cardiology eval included CardioMEMs placement.  Hospital course complicated by seizure.  Cardiology team thought her symptoms were secondary to seizures, this workup was negative.  Seizure evaluation included EEG, CT head, MRI brain-negative for any acute pathology.  She was discharged in stable condition on 5/19. She has not had any seizure activity since discharge.     Past Medical History:  Diagnosis Date  . Abnormal liver function     in the past.  . Anemia   . Arthritis    all over  . Bruises easily   . Cataract    right eye;immature  . Chronic back pain    stenosis  . Chronic cough   . Chronic kidney disease   . COPD (chronic obstructive pulmonary disease) ([place])   . Demand myocardial infarction [facility]) 2012   Demand Infarction in setting of Pancreatitis --> mild Troponin elevation, NON-OBSTRUCTIVE CAD  . Depression    takes Abilify daily as well as Zoloft  . Diastolic heart failure 3903   Grade 1 diastolic Dysfunction by Echo   . Diverticulosis   . DM (diabetes mellitus) ([place])    takes Victoza daily as well as Lantus and Humalog  . Empty sella ([place])    on MRI in 2009.  . Glaucoma   . Headache(784.0)    last migraine-4-58yr ago  . History of blood transfusion    no abnormal reaction noted  . History of colon polyps    benign  . HTN  (hypertension)    takes BKinder Morgan Energydaily  . Hyperlipidemia    takes Lipitor daily  . Joint swelling   . Nocturia   . Ob[place] hypoventilation syndrome ([place]   . Obstructive sleep apnea 02/2018   Notably improved split-night study with weight loss from 270 (2017) down to 250 pounds (2019).  . Pancreatitis    takes Pancrelipase daily  . [place]'s disease ([place]    takes Sinemet daily  . Peripheral neuropathy   . Pneumonia 2012  . Seizures ([place]    takes Lamictal daily and Primidone nightly;last seizure 2wks ago  . Urinary urgency    With increased frequency  . Varicose veins of both lower extremities with pain    With edema.  Takes daily Lasix     Family History  Problem Relation Age of Onset  . Allergies Father   . Heart disease Father        before 628 . Heart failure Father   . Hypertension Father   . Hyperlipidemia Father   . Heart disease Mother   . Diabetes Mother   . Hypertension Mother   . Hyperlipidemia Mother   . Hypertension Sister   . Heart disease Sister        before 649 . Hyperlipidemia Sister   . Diabetes Brother   . Hypertension Brother   .  Diabetes Sister   . Hypertension Sister   . Diabetes Son   . Hypertension Son   . Cancer Other      Current Outpatient Medications:  .  Accu-Chek Softclix Lancets lancets, Use as instructed to check blood sugars 2 times per day dx: e11.22, Disp: 300 each, Rfl: 3 .  acetaminophen (TYLENOL) 500 MG tablet, Take 500-1,000 mg by mouth every 4 (four) hours as needed for moderate pain. , Disp: , Rfl:  .  atorvastatin (LIPITOR) 10 MG tablet, Take 1 tablet (10 mg total) by mouth every evening., Disp: 90 tablet, Rfl: 2 .  B Complex-C (B-COMPLEX WITH VITAMIN C) tablet, Take 1 tablet by mouth daily., Disp: , Rfl:  .  Blood Glucose Calibration (ACCU-CHEK AVIVA) SOLN, Use as directed dx: e11.22, Disp: 1 each, Rfl: 3 .  Blood Glucose Monitoring Suppl (ACCU-CHEK AVIVA PLUS) w/Device KIT, Use as directed to  check blood sugars 2 times per day dx: e11.22, Disp: 1 kit, Rfl: 3 .  carvedilol (COREG) 25 MG tablet, Take 1 tablet (25 mg total) by mouth 2 (two) times daily with a meal., Disp: 180 tablet, Rfl: 3 .  Cholecalciferol (VITAMIN D-3) 1000 units CAPS, Take 1,000 Units by mouth daily., Disp: , Rfl:  .  diclofenac sodium (VOLTAREN) 1 % GEL, Apply 2 g topically 4 (four) times daily as needed (pain)., Disp: , Rfl:  .  DROPLET PEN NEEDLES 31G X 8 MM MISC, USE DAILY WITH VICTOZA AND/OR NOVOLOG. , Disp: 400 each, Rfl: 1 .  furosemide (LASIX) 80 MG tablet, Take 80 mg by mouth. 1 tab Monday - Friday, Disp: , Rfl:  .  glucose blood (ACCU-CHEK AVIVA PLUS) test strip, Use as instructed to check blood sugars 2 times per day dx: e11.22, Disp: 300 each, Rfl: 3 .  hydrALAZINE (APRESOLINE) 25 MG tablet, Take 2 tablets (50 mg total) by mouth 2 (two) times daily., Disp: 60 tablet, Rfl: 11 .  hydrocortisone cream 1 %, Apply 1 application topically daily as needed for itching., Disp: , Rfl:  .  insulin lispro (HUMALOG KWIKPEN) 100 UNIT/ML KwikPen, Inject 0.02-0.04 mLs (2-4 Units total) into the skin 3 (three) times daily as needed (high blood sugar over 150)., Disp: 15 mL, Rfl: 2 .  Isopropyl Alcohol (ALCOHOL WIPES) 70 % MISC, Apply 1 each topically 2 (two) times daily., Disp: 300 each, Rfl: 3 .  isosorbide mononitrate (IMDUR) 30 MG 24 hr tablet, Take 1 tablet (30 mg total) by mouth daily. (Patient taking differently: Take 60 mg by mouth daily. ), Disp: 30 tablet, Rfl: 11 .  lamoTRIgine (LAMICTAL) 150 MG tablet, Take 1 tablet (150 mg total) by mouth 2 (two) times daily., Disp: 180 tablet, Rfl: 3 .  levETIRAcetam (KEPPRA) 500 MG tablet, Take 1 tablet (500 mg total) by mouth 2 (two) times daily. (Patient taking differently: Take 500 mg by mouth at bedtime. ), Disp: 60 tablet, Rfl: 6 .  Menthol, Topical Analgesic, (STOPAIN ROLL-ON EX), Apply 1 application topically daily as needed (pain)., Disp: , Rfl:  .  Multiple  Vitamins-Minerals (MULTIVITAMIN WITH MINERALS) tablet, Take 1 tablet by mouth daily., Disp: , Rfl:  .  neomycin-bacitracin-polymyxin (NEOSPORIN) ointment, Apply 1 application topically as needed for wound care., Disp: , Rfl:  .  OZEMPIC, 1 MG/DOSE, 2 MG/1.5ML SOPN, INJECT 1MG INTO THE SKIN ONCE A WEEK (Patient taking differently: Inject 1 mg into the muscle once a week. Thursday), Disp: 12 mL, Rfl: 0 .  Pancrelipase, Lip-Prot-Amyl, (ZENPEP) 25000-79000 units CPEP, Take  1-2 capsules by mouth See admin instructions. Take 2 capsules in the morning, 1 capsule with lunch, and 2 capsules in the evening., Disp: , Rfl:  .  polyethylene glycol (MIRALAX / GLYCOLAX) 17 g packet, Take 17 g by mouth daily as needed for moderate constipation or severe constipation., Disp: 14 each, Rfl: 0 .  potassium chloride SA (KLOR-CON) 20 MEQ tablet, Take 40 meq daily, when taking metolazone take a total of 60 meq daily (Patient taking differently: Take 40-60 mEq by mouth See admin instructions. Take 40 meq daily, when taking metolazone take a total of 60 meq daily ), Disp: 135 tablet, Rfl: 1 .  sertraline (ZOLOFT) 50 MG tablet, Take 1 tablet (50 mg total) by mouth daily., Disp: 90 tablet, Rfl: 2 .  tolnaftate (TINACTIN) 1 % cream, Apply 1 application topically daily as needed (foot fungus). , Disp: , Rfl:  .  TOUJEO SOLOSTAR 300 UNIT/ML Solostar Pen, Inject 34 Units into the skin at bedtime., Disp: , Rfl:  .  albuterol (VENTOLIN HFA) 108 (90 Base) MCG/ACT inhaler, Inhale 1-2 puffs into the lungs every 6 (six) hours as needed for wheezing or shortness of breath., Disp: , Rfl:  .  aspirin 81 MG chewable tablet, Chew 1 tablet (81 mg total) by mouth daily. (Patient not taking: Reported on 11/17/2019), Disp:  , Rfl:  .  insulin glargine (LANTUS) 100 UNIT/ML injection, 5 units qhs, Disp: 10 mL, Rfl: 3   Allergies  Allergen Reactions  . Other Anaphylaxis and Rash    Bleach  . Penicillins Hives    Did it involve swelling of the  face/tongue/throat, SOB, or low BP? No Did it involve sudden or severe rash/hives, skin peeling, or any reaction on the inside of your mouth or nose? Yes Did you need to seek medical attention at a hospital or doctor's office? Yes When did it last happen?Childhood allergy  If all above answers are "NO", may proceed with cephalosporin use.    . Sulfa Antibiotics Hives  . Aspirin Other (See Comments)     On aspirin 81 mg - Rectal bleeding in dec 2018  . Codeine     Headache and makes the patient feel "off"     Review of Systems  Constitutional: Negative.   Respiratory: Negative.   Cardiovascular: Negative.   Gastrointestinal: Negative.   Neurological: Negative.   Psychiatric/Behavioral: Negative.      Today's Vitals   11/17/19 1224  BP: 116/70  Pulse: 61  Temp: 98.4 F (36.9 C)  TempSrc: Oral  Weight: 213 lb (96.6 kg)  Height: 5' 1"  (1.549 m)  PainSc: 0-No pain   Body mass index is 40.25 kg/m.   Objective:  Physical Exam Vitals and nursing note reviewed.  Constitutional:      Appearance: Normal appearance.  HENT:     Head: Normocephalic and atraumatic.  Cardiovascular:     Rate and Rhythm: Normal rate and regular rhythm.     Heart sounds: Normal heart sounds.  Pulmonary:     Effort: Pulmonary effort is normal.     Breath sounds: Normal breath sounds.  Musculoskeletal:     Right lower leg: 1+ Pitting Edema present.     Left lower leg: 1+ Pitting Edema present.  Skin:    General: Skin is warm.  Neurological:     General: No focal deficit present.     Mental Status: She is alert.  Psychiatric:        Mood and Affect: Mood normal.  Behavior: Behavior normal.         Assessment And Plan:     1. Syncope, unspecified syncope type  TCM PERFORMED. A MEMBER OF THE CLINICAL TEAM SPOKE WITH THE PATIENT UPON DISCHARGE. DISCHARGE SUMMARY WAS REVIEWED IN FULL DETAIL DURING THE VISIT. MEDS RECONCILED AND COMPARED TO DISCHARGE MEDS. MEDICATION LIST WAS  UPDATED AND REVIEWED WITH THE PATIENT. GREATER THAN 50% FACE TO FACE TIME WAS SPENT IN COUNSELING AND COORDINATION OF CARE. ALL QUESTIONS WERE ANSWERED TO THE SATISFACTION OF THE PATIENT. She had Cardiology and Neurology evaluation while hospitalized. She is reminded that Neuro increased her Keppra to 544m twice daily and that she should continue with Lamictal. She adds that she did not tolerate Keppra twice daily, so Neuro said she could take 1/2 in am and one tab in pm.    2. Recurrent depression ([place]  Chronic, she will continue with sertraline daily. Importance of medication coimpliance was discussed with the patient.   - sertraline (ZOLOFT) 50 MG tablet; Take 1 tablet (50 mg total) by mouth daily.  Dispense: 90 tablet; Refill: 2  3. Memory loss  She feels she has started to have issues with her memory. She is advised this could be related to her seizure activity.  She is reminded that elevated blood sugars could impair cognitive function. She is encouraged to take diabetes meds as prescribed.    4. Type 2 diabetes mellitus with stage 2 chronic kidney disease, with long-term current use of insulin (HCC)  Chronic. She was discharged on Lantus, but advised to resume Toujeo as originally prescribed. Advised to check blodo sugars every am so her insulin dose can be adjusted.   5. Hypertensive heart disease with chronic diastolic congestive heart failure (HCC)  Chronic, well controlled today. She is encouraged to avoid adding salt to her foods.  6. Chronic diastolic CHF (congestive heart failure), NYHA class 2 (HCC)  Chronic, yet stable. The importance of dietary compliance was discussed with the patient. Reminded to follow low salt diet. She will continue with carvedilol and torsemide.   7. Drug therapy  - BMP8+EGFR - CBC no Diff   RMaximino Greenland MD    THE PATIENT IS ENCOURAGED TO PRACTICE SOCIAL DISTANCING DUE TO THE COVID-19 PANDEMIC.

## 2019-11-24 ENCOUNTER — Ambulatory Visit: Payer: Medicare HMO | Admitting: Sports Medicine

## 2019-11-24 DIAGNOSIS — E1122 Type 2 diabetes mellitus with diabetic chronic kidney disease: Secondary | ICD-10-CM | POA: Diagnosis not present

## 2019-11-24 DIAGNOSIS — J449 Chronic obstructive pulmonary disease, unspecified: Secondary | ICD-10-CM | POA: Diagnosis not present

## 2019-11-24 DIAGNOSIS — N182 Chronic kidney disease, stage 2 (mild): Secondary | ICD-10-CM | POA: Diagnosis not present

## 2019-11-24 DIAGNOSIS — I5082 Biventricular heart failure: Secondary | ICD-10-CM | POA: Diagnosis not present

## 2019-11-24 DIAGNOSIS — I130 Hypertensive heart and chronic kidney disease with heart failure and stage 1 through stage 4 chronic kidney disease, or unspecified chronic kidney disease: Secondary | ICD-10-CM | POA: Diagnosis not present

## 2019-11-24 DIAGNOSIS — D631 Anemia in chronic kidney disease: Secondary | ICD-10-CM | POA: Diagnosis not present

## 2019-11-24 DIAGNOSIS — J9611 Chronic respiratory failure with hypoxia: Secondary | ICD-10-CM | POA: Diagnosis not present

## 2019-11-24 DIAGNOSIS — G40909 Epilepsy, unspecified, not intractable, without status epilepticus: Secondary | ICD-10-CM | POA: Diagnosis not present

## 2019-11-24 DIAGNOSIS — I5032 Chronic diastolic (congestive) heart failure: Secondary | ICD-10-CM | POA: Diagnosis not present

## 2019-11-26 ENCOUNTER — Encounter: Payer: Medicare HMO | Admitting: Internal Medicine

## 2019-11-26 ENCOUNTER — Ambulatory Visit: Payer: Medicare HMO

## 2019-11-27 DIAGNOSIS — G40909 Epilepsy, unspecified, not intractable, without status epilepticus: Secondary | ICD-10-CM | POA: Diagnosis not present

## 2019-11-27 DIAGNOSIS — I130 Hypertensive heart and chronic kidney disease with heart failure and stage 1 through stage 4 chronic kidney disease, or unspecified chronic kidney disease: Secondary | ICD-10-CM | POA: Diagnosis not present

## 2019-11-27 DIAGNOSIS — I5082 Biventricular heart failure: Secondary | ICD-10-CM | POA: Diagnosis not present

## 2019-11-27 DIAGNOSIS — J9611 Chronic respiratory failure with hypoxia: Secondary | ICD-10-CM | POA: Diagnosis not present

## 2019-11-27 DIAGNOSIS — E1122 Type 2 diabetes mellitus with diabetic chronic kidney disease: Secondary | ICD-10-CM | POA: Diagnosis not present

## 2019-11-27 DIAGNOSIS — D631 Anemia in chronic kidney disease: Secondary | ICD-10-CM | POA: Diagnosis not present

## 2019-11-27 DIAGNOSIS — N182 Chronic kidney disease, stage 2 (mild): Secondary | ICD-10-CM | POA: Diagnosis not present

## 2019-11-27 DIAGNOSIS — I5032 Chronic diastolic (congestive) heart failure: Secondary | ICD-10-CM | POA: Diagnosis not present

## 2019-11-27 DIAGNOSIS — J449 Chronic obstructive pulmonary disease, unspecified: Secondary | ICD-10-CM | POA: Diagnosis not present

## 2019-11-30 ENCOUNTER — Telehealth: Payer: Self-pay

## 2019-11-30 DIAGNOSIS — E1122 Type 2 diabetes mellitus with diabetic chronic kidney disease: Secondary | ICD-10-CM | POA: Diagnosis not present

## 2019-11-30 DIAGNOSIS — N182 Chronic kidney disease, stage 2 (mild): Secondary | ICD-10-CM | POA: Diagnosis not present

## 2019-11-30 DIAGNOSIS — J449 Chronic obstructive pulmonary disease, unspecified: Secondary | ICD-10-CM | POA: Diagnosis not present

## 2019-11-30 DIAGNOSIS — I5032 Chronic diastolic (congestive) heart failure: Secondary | ICD-10-CM | POA: Diagnosis not present

## 2019-11-30 DIAGNOSIS — I130 Hypertensive heart and chronic kidney disease with heart failure and stage 1 through stage 4 chronic kidney disease, or unspecified chronic kidney disease: Secondary | ICD-10-CM | POA: Diagnosis not present

## 2019-11-30 DIAGNOSIS — I5082 Biventricular heart failure: Secondary | ICD-10-CM | POA: Diagnosis not present

## 2019-11-30 DIAGNOSIS — G40909 Epilepsy, unspecified, not intractable, without status epilepticus: Secondary | ICD-10-CM | POA: Diagnosis not present

## 2019-11-30 DIAGNOSIS — J9611 Chronic respiratory failure with hypoxia: Secondary | ICD-10-CM | POA: Diagnosis not present

## 2019-11-30 DIAGNOSIS — D631 Anemia in chronic kidney disease: Secondary | ICD-10-CM | POA: Diagnosis not present

## 2019-12-01 ENCOUNTER — Other Ambulatory Visit: Payer: Self-pay

## 2019-12-01 ENCOUNTER — Ambulatory Visit: Payer: Medicare HMO

## 2019-12-01 DIAGNOSIS — I5032 Chronic diastolic (congestive) heart failure: Secondary | ICD-10-CM

## 2019-12-01 DIAGNOSIS — E1122 Type 2 diabetes mellitus with diabetic chronic kidney disease: Secondary | ICD-10-CM

## 2019-12-01 NOTE — Chronic Care Management (AMB) (Signed)
Chronic Care Management Pharmacy  Name: [name]  MRN: [id number] DOB: 1945/07/22  Chief Complaint/ HPI  [name],  74 y.o. , female presents for their Initial CCM visit with the clinical pharmacist via telephone due to COVID-19 Pandemic.  PCP : [name], MD  Their chronic conditions include: Hypertension, Chronic diastolic CHF, Diabetes, Seizure disorder, Rheumatoid arthritis, Dyslipidemia  Office Visits: 11/17/19 OV: Presented for hospitalization follow up. No seizure activity since discharge. Pt reported she could not tolerate Keppra 571m twice daily and that Neurology said she could take 1/2 tablet in the morning and 1 tablet in the evening. Pt noted memory loss as a problem. Advised that it could be related to seizure activity or elevated blood sugars. Was discharged on Lantus, but advised to resume Toujeo as originally prescribed. Advised to check blood sugar every morning in order for insulin dose adjustments to be made. Labs ordered (BMP8+EGFR, CBC no Diff).   09/28/19 OV: Presented for evaluation of back and foot pain. Chronic left-sided sciatica flare. Administered Kenalog 614mIM. Advised to get magnesium oil to rub on low back. Consider Neurosurgery evaluation. Referred to Podiatry for orthotics. Importance of medication adherence stressed. BP uncontrolled, but pt had not taken fluid pill.    08/24/19 OV: Presented for DM and HTN follow up. Reported Ed prescribed Keppra for seizures on 2/6, but pt has not started medication. Pt advised to take medication as prescribed by ED physician. Will try to move up Neuro appt. Labs ordered (HgbA1c, BMP8+EGFR, potassium, lamotrigine).   Consult Visits: 11/08/19-11/11/19 ED admission: Presented with syncope. Hospital course complicated by seizure. Cardiology placed CardioMEMs and thinks her symptoms are secondary to seizures, but seizure evaluation in hospital was negative (EEG, CT head, MRI brain). Neurology recommended  increasing Keppra to 50043mwice daily and continue Lamictal. Lantus decreased to 5 units at bedtime due to hypoglycemic episode in hospital. Follow up with PCP in 1-2 weeks. Obtain BMP/CBC in 1 week. Follow up with Cardiology and Neurology.  09/15/19 Neurology telephone call: Pt called and reported possible side effect from Keppra (light-headed and no energy). Instructed pt to decrease Keppra to 500m84m bedtime.   09/11/19 Neurology OV w/ Dr. AquiDelice Leschesent for follow up regarding seizures. Start Keprra 500mg75m. Continue Lamictal 150mg 16m Follow up in 6 months.   09/08/19 Cardiology OV w/ Dr. McLeanAundra Dubinented for follow up of CHF. SOB walking around house. 3 pillow orthopnea chronically. NYHA III, not volume overloaded. Continue Lasix 80mg d72m. Do not think metolazone needed. Arrange PFTs. Follow up in 3 months.   08/01/19 ED visit: Presented for chest pain and seizures. Started Keppra 500mg tw64mdaily. Follow up with primary neurologist.   07/23/19 Cardiology OV w/ Dr. McLean: Aundra Dubinue current medications. Lab work done. Will schedule pt for chest Xray and VQ scan. PT will need pulmonary function testing once they begin doing outpatient. Referred for Cardiomems device.   07/10/19 Neurology Telephone call: Left message instructing pt to increase Lamictal to 150mg BID36mevious dose 125mg BID,93m pt reported seizures).   06/24/19 Neurology OV w/ A. Lomax: Presented for 7 month follow up of OSA on CPAP. Pt is 80-90% compliant with CPAP use. Finds it beneficial and denied any issues. Follow up with Dr. Alva ([facility]) as directed for CPAP management and hypoventilation syndrome.   05/28/19 Pulmonology OV w/ Dr. Alva: ContElsworth Soho Lasix 80 mg daily and 40mg on th32mekends. Breathing improved with weight loss. CPAP setting working well. Pt is fairly  compliant with CPAP, encouraged 4-6 hrs nightly use.   CCM Encounters: 11/19/19 SW: Referred to PharmD to address adherence issues.   09/07/19 RN:  Evaluated current DM treatment plan. Collaborated with PCP regarding Humalog refill needed. Determined pt received Keppra via mail but has not started taking, wants to see Neurology first.   09/02/19 SW: Determined pt has not filled [place] yet, states pharmacy does not have on file. Discussed BP. PharmD: Refill for Keppra sent to [facility] mail order today for 30 day supply.   08/07/19 SW: Follow up with pt post-ED visit. Discussed new medication and plan to follow up with neurology.   07/06/19 PharmD: Comprehensive medication review performed. PT reported seizure activity for past 2-3 weeks. Advised caregiver (sister) to call Dr. Delice Lesch regarding this matter ASAP. Notified Dr. Baird Cancer.   Medications: Outpatient Encounter Medications as of 12/01/2019  Medication Sig  . Accu-Chek Softclix Lancets lancets Use as instructed to check blood sugars 2 times per day dx: e11.22  . acetaminophen (TYLENOL) 500 MG tablet Take 500-1,000 mg by mouth every 4 (four) hours as needed for moderate pain.   Marland Kitchen albuterol (VENTOLIN HFA) 108 (90 Base) MCG/ACT inhaler Inhale 1-2 puffs into the lungs every 6 (six) hours as needed for wheezing or shortness of breath.  Marland Kitchen aspirin 81 MG chewable tablet Chew 1 tablet (81 mg total) by mouth daily.  Marland Kitchen atorvastatin (LIPITOR) 10 MG tablet Take 1 tablet (10 mg total) by mouth every evening.  . B Complex-C (B-COMPLEX WITH VITAMIN C) tablet Take 1 tablet by mouth daily.  . Blood Glucose Calibration (ACCU-CHEK AVIVA) SOLN Use as directed dx: e11.22  . Blood Glucose Monitoring Suppl (ACCU-CHEK AVIVA PLUS) w/Device KIT Use as directed to check blood sugars 2 times per day dx: e11.22  . Cholecalciferol (VITAMIN D-3) 1000 units CAPS Take 1,000 Units by mouth daily.  . diclofenac sodium (VOLTAREN) 1 % GEL Apply 2 g topically 4 (four) times daily as needed (pain).  . furosemide (LASIX) 80 MG tablet Take 80 mg by mouth. 1 tab Monday - Friday  . glucose blood (ACCU-CHEK AVIVA PLUS) test strip Use as  instructed to check blood sugars 2 times per day dx: e11.22  . hydrocortisone cream 1 % Apply 1 application topically daily as needed for itching.  . insulin lispro (HUMALOG KWIKPEN) 100 UNIT/ML KwikPen Inject 0.02-0.04 mLs (2-4 Units total) into the skin 3 (three) times daily as needed (high blood sugar over 150).  . Isopropyl Alcohol (ALCOHOL WIPES) 70 % MISC Apply 1 each topically 2 (two) times daily.  . isosorbide mononitrate (IMDUR) 30 MG 24 hr tablet Take 1 tablet (30 mg total) by mouth daily. (Patient taking differently: Take 60 mg by mouth daily. )  . lamoTRIgine (LAMICTAL) 150 MG tablet Take 1 tablet (150 mg total) by mouth 2 (two) times daily.  . Menthol, Topical Analgesic, (STOPAIN ROLL-ON EX) Apply 1 application topically daily as needed (pain).  . Multiple Vitamins-Minerals (MULTIVITAMIN WITH MINERALS) tablet Take 1 tablet by mouth daily.  Marland Kitchen neomycin-bacitracin-polymyxin (NEOSPORIN) ointment Apply 1 application topically as needed for wound care. (Patient not taking: Reported on 12/21/2019)  . OZEMPIC, 1 MG/DOSE, 2 MG/1.5ML SOPN INJECT 1MG INTO THE SKIN ONCE A WEEK (Patient taking differently: Inject 1 mg into the muscle once a week. Thursday)  . Pancrelipase, Lip-Prot-Amyl, (ZENPEP) 25000-79000 units CPEP Take 1-2 capsules by mouth See admin instructions. Take 2 capsules in the morning, 1 capsule with lunch, and 2 capsules in the evening.  . polyethylene  glycol (MIRALAX / GLYCOLAX) 17 g packet Take 17 g by mouth daily as needed for moderate constipation or severe constipation.  . sertraline (ZOLOFT) 50 MG tablet Take 1 tablet (50 mg total) by mouth daily.  Marland Kitchen tolnaftate (TINACTIN) 1 % cream Apply 1 application topically daily as needed (foot fungus).   . [DISCONTINUED] carvedilol (COREG) 25 MG tablet Take 1 tablet (25 mg total) by mouth 2 (two) times daily with a meal.  . [DISCONTINUED] DROPLET PEN NEEDLES 31G X 8 MM MISC USE DAILY WITH VICTOZA AND/OR NOVOLOG.   . [DISCONTINUED]  hydrALAZINE (APRESOLINE) 25 MG tablet Take 2 tablets (50 mg total) by mouth 2 (two) times daily.  . [DISCONTINUED] levETIRAcetam (KEPPRA) 500 MG tablet Take 1 tablet (500 mg total) by mouth 2 (two) times daily. (Patient taking differently: Take 500 mg by mouth at bedtime. )  . [DISCONTINUED] potassium chloride SA (KLOR-CON) 20 MEQ tablet Take 40 meq daily, when taking metolazone take a total of 60 meq daily (Patient taking differently: 40 mEq daily. Take 40 meq daily, when taking metolazone take a total of 60 meq daily )  . [DISCONTINUED] TOUJEO SOLOSTAR 300 UNIT/ML Solostar Pen Inject 34 Units into the skin at bedtime.   No facility-administered encounter medications on file as of 12/01/2019.    Current Diagnosis/Assessment:  SDOH Interventions     Most Recent Value  SDOH Interventions  Financial Strain Interventions Intervention Not Indicated     Goals Addressed            This Visit's Progress   . Pharmacy Care Plan       CARE PLAN ENTRY (see longitudinal plan of care for additional care plan information)  Current Barriers:  . Chronic Disease Management support, education, and care coordination needs related to Hypertension, Hyperlipidemia, Diabetes, and Heart Failure   Hypertension BP Readings from Last 3 Encounters:  12/21/19 116/70  12/17/19 140/65  12/03/19 132/74   . Pharmacist Clinical Goal(s): o Over the next 90 days, patient will work with PharmD and providers to achieve BP goal <130/80 . Current regimen:  o Carvedilol 8m twice daily o Furosemide 80 mg daily Monday through Friday o Hydralazine 230m2 tablets twice daily . Interventions: o Clarify dosing of hydralazine with cardiologist . Patient self care activities - Over the next 90 days, patient will: o Check BP daily, document, and provide at future appointments o Ensure daily salt intake < 2300 mg/day  Hyperlipidemia Lab Results  Component Value Date/Time   LDLCALC 65 12/17/2019 11:46 AM   LDLCALC 65  10/22/2018 02:46 PM   LDLDIRECT 66.0 01/27/2015 11:04 AM   . Pharmacist Clinical Goal(s): o Over the next 90 days, patient will work with PharmD and providers to maintain LDL goal < 70 . Current regimen:  o Atorvastatin 1063maily . Interventions: o Dietary recommendations provided . Patient self care activities - Over the next 90 days, patient will: o Take cholesterol medication daily as directed  Diabetes Lab Results  Component Value Date/Time   HGBA1C 7.6 (H) 11/09/2019 12:21 AM   HGBA1C 8.0 (H) 08/24/2019 03:30 PM   . Pharmacist Clinical Goal(s): o Over the next 90 days, patient will work with PharmD and providers to achieve A1c goal <7% . Current regimen:  o Humalog Kwikpen 2-4 units three times daily as needed o Ozempic 1mg16mekly o Toujeo 300 units/mL 34 units at bedtime . Interventions: o Dietary recommendations provided o Provided patient education about HgbA1c . Patient self care activities - Over  the next 90 days, patient will: o Check blood sugar 3-4 times daily, document, and provide at future appointments o Contact provider with any episodes of hypoglycemia Miscellaneous . Interventions: o Recommended over the counter antifungal powder for sweat irritation in folds of skin, patient had been using neosporin o Recommended hydrocortisone for irritation/itching on buttocks . Patient self care activities - Over the next 30 days, patient will: o Try powder for sweat irritation in folds of skin o Apply hydrocortisone as needed for irritation/itching on buttocks  Medication management . Pharmacist Clinical Goal(s): o Over the next 90 days, patient will work with PharmD and providers to maintain optimal medication adherence . Current pharmacy: [facility] . Interventions o Comprehensive medication review performed. o Continue current medication management strategy . Patient self care activities - Over the next 90 days, patient will: o Focus on medication  adherence by using pill box o Take medications as prescribed o Report any questions or concerns to PharmD and/or provider(s)  Initial goal documentation       Diabetes   Recent Relevant Labs: Lab Results  Component Value Date/Time   HGBA1C 7.6 (H) 11/09/2019 12:21 AM   HGBA1C 8.0 (H) 08/24/2019 03:30 PM   MICROALBUR 10 10/22/2018 12:45 PM   MICROALBUR <0.7 04/17/2016 09:55 AM   MICROALBUR <0.7 07/01/2015 10:41 AM    Checking BG: 3x per Day at least and more if too low or too high  Recent FBG Readings: 71, 91, 117, 84, 88, 123, 103, 114 Recent pre-meal BG readings: 125, 130, 140, 85, 99, 135, 124, 121, 94,80, 77, 110, 139, 78, 82 Recent 2hr PP BG readings:   Recent HS BG readings:  Patient has failed these meds in past: Jardiance, Invokana, Lantus, Levemir, Victoza, Metformin Patient is currently uncontrolled on the following medications:  -Humalog Kwikpen 2-4 units three times daily as needed -Ozempic 32m weekly -Toujeo 300 units/mL 34 units at bedtime  Last diabetic Foot exam: Not on file   Last diabetic Eye exam:  Lab Results  Component Value Date/Time   HMDIABEYEEXA No Retinopathy 04/15/2019 12:00 AM   We discussed: -Diet extensively  BBerniece Salines pimento cheese sandwich  Burgers/hot dogs from fast food for lunch sometimes  Sister bakes or boils all meat (Chicken, t[place] pork chops)  Salads, brussel sprouts, creamed potatoes  Very seldom eats desserts  Drinks water usually, 4 or 5 bottles of water daily -Exercise extensively  Walks circle driveway with sister  Walks and does leg exercises with physical therapy  Reports exercising 5 days per week right now (2 with PT, 2-3 on  her own) -Last used Humalog 5/22 -Pt states insulin is free -Provided pt education regarding HgbA1c  Plan -Continue current medications   Heart Failure   Type: Diastolic  Last ejection fraction: 60-65% on 11/13/19 NYHA Class: II (slight limitation of activity)  Patient has failed  these meds in past: Isosorbide dinitrate, metolazone, Bystolic, torsemide Patient is currently controlled on the following medications:  -Carvedilol 25mtwice daily -Furosemide 80 mg daily Monday through Friday -Hydralazine 2594m tablets twice daily -Isosorbide mononitrate 47m51mtablets daily  -Potassium 20me70mtablets daily, 3 tablets when taking metolazone  We discussed -weighing daily; if you gain more than 3 pounds in one day or 5 pounds in one week call your doctor  -Pt states she take hydralazine 50mg 26mblets by mouth BID (100mg B74m but medication record lists 50mg BI89mt is not taking metolazone because weight is down  Plan -Continue  current medications  Hypertension   Office blood pressures are  BP Readings from Last 3 Encounters:  12/21/19 116/70  12/17/19 140/65  12/03/19 132/74    Patient has failed these meds in the past: Valsartan, losartan Patient is currently controlled on the following medications: See HF  Patient checks BP at home daily  Patient home BP readings are ranging: 155/70 (HR 61), 150/66 (HR 63), 148/78 (HR 62), 150/68 (HR 63), 148/68 (HR 68), 153/66 (HR 46), 153/70 (HR 60), 153/73 (HR 60)  We discussed: -BP goal is <130/80 -Pt does not add extra salt to food  Plan Continue current medications   Hyperlipidemia   Lipid Panel     Component Value Date/Time   CHOL 150 12/17/2019 1146   CHOL 145 10/22/2018 1446   TRIG 47 12/17/2019 1146   HDL 76 12/17/2019 1146   HDL 70 10/22/2018 1446   LDLCALC 65 12/17/2019 1146   LDLCALC 65 10/22/2018 1446   LDLDIRECT 66.0 01/27/2015 1104     The 10-year ASCVD risk score Mikey Bussing DC Jr., et al., 2013) is: 19.4%   Values used to calculate the score:     Age: 89 years     Sex: Female     Is Non-Hispanic African American: Yes     Diabetic: Yes     Tobacco smoker: No     Systolic Blood Pressure: 441 mmHg     Is BP treated: Yes     HDL Cholesterol: 76 mg/dL     Total Cholesterol: 150 mg/dL    Patient has failed these meds in past: N/A Patient is currently controlled on the following medications:  -Atorvastatin 70m daily  We discussed:  -Diet extensively  Plan- Continue current medications   Seizure DIsorder   Patient has failed these meds in past: N/A Patient is currently uncontrolled on the following medications:  -Lamotrigine 1557mtwice daily -Levetiracetam 50072mt bedtime  We discussed:  -Pt taking Keppra 500m9m tablet at bedtime -Still reporting seizures  Plan Continue current medications  Follow up with Neurology  Miscellaneous   We discussed:   -Pt noted irritation in skin folds from sweat. She has been using neosporin -Recommended antifungal powder over the counter or Gold bond powder to help absorb sweat -Pt mentioned irritation on bottom from sitting on a hospital bed pan for too long -Recommended hydrocortisone as needed for itching/irritation  Plan Start over the counter antifungal powder and hydrocortisone cream as needed  Vaccines   Reviewed and discussed patient's vaccination history.    Immunization History  Administered Date(s) Administered  . Influenza Split 04/11/2011, 06/30/2013  . Influenza, High Dose Seasonal PF 02/14/2017, 04/24/2018  . Influenza-Unspecified 04/07/2014  . PFIZER SARS-COV-2 Vaccination 08/06/2019, 08/31/2019  . Tdap 05/18/2012  . Zoster 04/14/2014    Plan Review at follow up visit  Medication Management   Pt uses [facility] all medications Uses pill box? Yes Pt endorses 100% compliance  We discussed:  -Importance of taking medications daily as directed  Plan -Continue current medication management strategy   Follow up: 3 week office visit  CourJannette FogoarmD Clinical Pharmacist [facility] [phone]

## 2019-12-02 ENCOUNTER — Inpatient Hospital Stay: Payer: Medicare HMO | Admitting: Internal Medicine

## 2019-12-02 DIAGNOSIS — D631 Anemia in chronic kidney disease: Secondary | ICD-10-CM | POA: Diagnosis not present

## 2019-12-02 DIAGNOSIS — I130 Hypertensive heart and chronic kidney disease with heart failure and stage 1 through stage 4 chronic kidney disease, or unspecified chronic kidney disease: Secondary | ICD-10-CM | POA: Diagnosis not present

## 2019-12-02 DIAGNOSIS — E1122 Type 2 diabetes mellitus with diabetic chronic kidney disease: Secondary | ICD-10-CM | POA: Diagnosis not present

## 2019-12-02 DIAGNOSIS — I5082 Biventricular heart failure: Secondary | ICD-10-CM | POA: Diagnosis not present

## 2019-12-02 DIAGNOSIS — G40909 Epilepsy, unspecified, not intractable, without status epilepticus: Secondary | ICD-10-CM | POA: Diagnosis not present

## 2019-12-02 DIAGNOSIS — N182 Chronic kidney disease, stage 2 (mild): Secondary | ICD-10-CM | POA: Diagnosis not present

## 2019-12-02 DIAGNOSIS — J449 Chronic obstructive pulmonary disease, unspecified: Secondary | ICD-10-CM | POA: Diagnosis not present

## 2019-12-02 DIAGNOSIS — J9611 Chronic respiratory failure with hypoxia: Secondary | ICD-10-CM | POA: Diagnosis not present

## 2019-12-02 DIAGNOSIS — I5032 Chronic diastolic (congestive) heart failure: Secondary | ICD-10-CM | POA: Diagnosis not present

## 2019-12-03 ENCOUNTER — Other Ambulatory Visit: Payer: Self-pay

## 2019-12-03 ENCOUNTER — Ambulatory Visit (HOSPITAL_COMMUNITY)
Admit: 2019-12-03 | Discharge: 2019-12-03 | Disposition: A | Discharge status: Home / Self Care | Payer: Medicare HMO | Source: Ambulatory Visit | Attending: Cardiology | Admitting: Cardiology

## 2019-12-03 ENCOUNTER — Encounter (HOSPITAL_COMMUNITY): Payer: Self-pay

## 2019-12-03 VITALS — BP 132/74 | HR 64 | Wt 219.2 lb

## 2019-12-03 DIAGNOSIS — Z79899 Other long term (current) drug therapy: Secondary | ICD-10-CM | POA: Diagnosis not present

## 2019-12-03 DIAGNOSIS — G4733 Obstructive sleep apnea (adult) (pediatric): Secondary | ICD-10-CM | POA: Insufficient documentation

## 2019-12-03 DIAGNOSIS — Z794 Long term (current) use of insulin: Secondary | ICD-10-CM | POA: Diagnosis not present

## 2019-12-03 DIAGNOSIS — Z8673 Personal history of transient ischemic attack (TIA), and cerebral infarction without residual deficits: Secondary | ICD-10-CM | POA: Insufficient documentation

## 2019-12-03 DIAGNOSIS — F329 Major depressive disorder, single episode, unspecified: Secondary | ICD-10-CM | POA: Diagnosis not present

## 2019-12-03 DIAGNOSIS — E785 Hyperlipidemia, unspecified: Secondary | ICD-10-CM | POA: Diagnosis not present

## 2019-12-03 DIAGNOSIS — R0609 Other forms of dyspnea: Secondary | ICD-10-CM | POA: Insufficient documentation

## 2019-12-03 DIAGNOSIS — I5032 Chronic diastolic (congestive) heart failure: Secondary | ICD-10-CM

## 2019-12-03 DIAGNOSIS — I272 Pulmonary hypertension, unspecified: Secondary | ICD-10-CM | POA: Diagnosis not present

## 2019-12-03 DIAGNOSIS — Z7982 Long term (current) use of aspirin: Secondary | ICD-10-CM | POA: Insufficient documentation

## 2019-12-03 DIAGNOSIS — Z87891 Personal history of nicotine dependence: Secondary | ICD-10-CM | POA: Diagnosis not present

## 2019-12-03 DIAGNOSIS — E119 Type 2 diabetes mellitus without complications: Secondary | ICD-10-CM | POA: Insufficient documentation

## 2019-12-03 DIAGNOSIS — I110 Hypertensive heart disease with heart failure: Secondary | ICD-10-CM | POA: Insufficient documentation

## 2019-12-03 DIAGNOSIS — G40909 Epilepsy, unspecified, not intractable, without status epilepticus: Secondary | ICD-10-CM | POA: Diagnosis not present

## 2019-12-03 LAB — CBC
HCT: 32.3 % — ABNORMAL LOW (ref 36.0–46.0)
Hemoglobin: 9.8 g/dL — ABNORMAL LOW (ref 12.0–15.0)
MCH: 22.8 pg — ABNORMAL LOW (ref 26.0–34.0)
MCHC: 30.3 g/dL (ref 30.0–36.0)
MCV: 75.1 fL — ABNORMAL LOW (ref 80.0–100.0)
Platelets: 248 10*3/uL (ref 150–400)
RBC: 4.3 MIL/uL (ref 3.87–5.11)
RDW: 17.7 % — ABNORMAL HIGH (ref 11.5–15.5)
WBC: 4.8 10*3/uL (ref 4.0–10.5)
nRBC: 0 % (ref 0.0–0.2)

## 2019-12-03 LAB — BASIC METABOLIC PANEL
Anion gap: 10 (ref 5–15)
BUN: 16 mg/dL (ref 8–23)
CO2: 26 mmol/L (ref 22–32)
Calcium: 8.9 mg/dL (ref 8.9–10.3)
Chloride: 99 mmol/L (ref 98–111)
Creatinine, Ser: 1.04 mg/dL — ABNORMAL HIGH (ref 0.44–1.00)
GFR calc Af Amer: >60 mL/min (ref >60)
GFR calc non Af Amer: 54 mL/min — ABNORMAL LOW (ref >60)
Glucose, Bld: 169 mg/dL — ABNORMAL HIGH (ref 70–99)
Potassium: 3.5 mmol/L (ref 3.5–5.1)
Sodium: 135 mmol/L (ref 135–145)

## 2019-12-03 LAB — BRAIN NATRIURETIC PEPTIDE: B Natriuretic Peptide: 81.3 pg/mL (ref 0.0–100.0)

## 2019-12-03 MED ORDER — HYDRALAZINE HCL 25 MG PO TABS: 75.0000 mg | ORAL_TABLET | Freq: Three times a day (TID) | ORAL | 3 refills | Status: DC

## 2019-12-03 NOTE — Patient Instructions (Signed)
INCREASE Hydralazine to 75 mg (three tabs) three times daily  Labs today We will only contact you if something comes back abnormal or we need to make some changes. Otherwise no news is good news!  Keep follow up as scheduled with Dr [name]  Do the following things EVERYDAY: 1) Weigh yourself in the morning before breakfast. Write it down and keep it in a log. 2) Take your medicines as prescribed 3) Eat low salt foods--Limit salt (sodium) to 2000 mg per day.  4) Stay as active as you can everyday 5) Limit all fluids for the day to less than 2 liters  At the [facility], you and your health needs are our priority. As part of our continuing mission to provide you with exceptional heart care, we have created designated Provider Care Teams. These Care Teams include your primary Cardiologist (physician) and Advanced Practice Providers (APPs- Physician Assistants and Nurse Practitioners) who all work together to provide you with the care you need, when you need it.   You may see any of the following providers on your designated Care Team at your next follow up:  Dr Glori Bickers  Dr Haynes Kerns, NP  Lyda Jester, [place]  Audry Riles, PharmD   Please be sure to bring in all your medications bottles to every appointment.

## 2019-12-03 NOTE — Progress Notes (Signed)
PCP: [name], MD Cardiology: Dr. Ellyn Hack HF Cardiology: Dr. [name]  Reason for visit: Follow-up for chronic diastolic heart failure  74 y.o. with history of OHS/OSA and RV dysfunction initially referred by Dr. Ellyn Hack for evaluation of CHF.  She has a history of OSA/OHS.  She uses CPAP at night but not currently using oxygen during the day.  Remote smoker.  She had an echo in 9/20 showing EF 50-55% but moderate-severe RV dysfunction with mild-moderate RV dilation.  RHC/LHC in 10/20 showed normal coronaries, pulmonary venous hypertension. Finally, she has a seizure disorder and has had multiple seizures in the last month.   She was recently evaluated for syncope and wore a heart monitor which showed no significant arrhythmia.  Last month, 5/21, she was admitted for placement of CardioMEMS and while admitted had another episode of loss of consciousness with jerking that was witnessed.  This was not associated with an arrhythmia.  Given history of seizure disorder, this was suspected to be a seizure.  Neurology was consulted.  MRI was performed and showed evidence of prior CVA, nonacute. ASA added. EEG was negative.  Neurology recommended continuation of Keppra.  No changes in antielliptic medications.  They advised no driving, per [place] DMV statutes.   During placement of pulmonary artery catheter, right heart catheterization demonstrated moderate pulmonary hypertension, suspected to be who groups 2 and 3 in setting of chronic diastolic heart failure, OHS/OSA and heart cardiac output.  She presents to clinic today for post hospital follow-up.  Here with her sister.  She drove her to the appointment.  BP has been elevated recently, in the upper 008Q- 761 systolic at home.  Per chart review, her hydralazine had been reduced down to 50 mg bid  and Imdur reduced down to 30 mg daily due to hypotension.  She denies any associated dizziness.  No hypotension at home.  Denies chest pain.  She has chronic  stable dyspnea with exertion.  Not any worse than usual baseline.  Denies any resting dyspnea.  Weight has been stable.  No lower extremity edema.  She has had 2 additional seizures since hospital discharge.  She reports full compliance with Keppra.  She has not been driving.  Compliance with CardioMEMS transmission is 83%.  Diastolic pulmonary artery pressure today was 21 mmHg.  However goal diastolic pulmonary artery pressure has not yet been set.   ECG (personally reviewed): Not performed today  Labs (10/20): K 4.1, creatinine 1.15, hgb 11.4 Labs (5/21): K 4.5, creatinine 1.01: Hgb 11.2  PMH: 1. OHS/OSA: She uses CPAP.  2. H/o CVA 3. Seizure disorder 4. Type 2 DM 5. Depression 6. HTN 7. Hyperlipidemia 8. Chronic diastolic CHF: Echo (9/50) with EF 50-55%, moderate to severe RV dilation with mild-moderately decreased RV systolic function.  - RHC/LHC (10/20): normal coronaries; mean RA 10, PA 56/14 mean 31, mean PCWP 13, LVEDP 18, CO/CI 7.35/3.73, PVR 2.44.  9. Primarily pulmonary venous hypertension.   Social History   Socioeconomic History  . Marital status: Divorced    Spouse name: Not on file  . Number of children: Not on file  . Years of education: Not on file  . Highest education level: Not on file  Occupational History  . Occupation: Disabled    Comment: CNA  Tobacco Use  . Smoking status: Former Smoker    Packs/day: 0.50    Years: 25.00    Pack years: 12.50    Types: Cigarettes  . Smokeless tobacco: Never Used  . Tobacco  comment: quit smoking 20+ytrs ago  Vaping Use  . Vaping Use: Never used  Substance and Sexual Activity  . Alcohol use: No  . Drug use: No  . Sexual activity: Not Currently    Birth control/protection: Surgical  Other Topics Concern  . Not on file  Social History Narrative   She lives in [place]. She has lots of family in the area and is accompanied by her sister.   She is a retired Quarry manager.  Disabled secondary to recurrent seizure  activity.   She has a distant history of smoking, quit 20 years ago.   Social Determinants of Health   Financial Resource Strain:   . Difficulty of Paying Living Expenses:   Food Insecurity:   . Worried About Charity fundraiser in the Last Year:   . Arboriculturist in the Last Year:   Transportation Needs:   . Film/video editor (Medical):   Marland Kitchen Lack of Transportation (Non-Medical):   Physical Activity:   . Days of Exercise per Week:   . Minutes of Exercise per Session:   Stress:   . Feeling of Stress :   Social Connections:   . Frequency of Communication with Friends and Family:   . Frequency of Social Gatherings with Friends and Family:   . Attends Religious Services:   . Active Member of Clubs or Organizations:   . Attends Archivist Meetings:   Marland Kitchen Marital Status:   Intimate Partner Violence:   . Fear of Current or Ex-Partner:   . Emotionally Abused:   Marland Kitchen Physically Abused:   . Sexually Abused:    Family History  Problem Relation Age of Onset  . Allergies Father   . Heart disease Father        before 38  . Heart failure Father   . Hypertension Father   . Hyperlipidemia Father   . Heart disease Mother   . Diabetes Mother   . Hypertension Mother   . Hyperlipidemia Mother   . Hypertension Sister   . Heart disease Sister        before 32  . Hyperlipidemia Sister   . Diabetes Brother   . Hypertension Brother   . Diabetes Sister   . Hypertension Sister   . Diabetes Son   . Hypertension Son   . Cancer Other    Current Outpatient Medications  Medication Sig Dispense Refill  . Accu-Chek Softclix Lancets lancets Use as instructed to check blood sugars 2 times per day dx: e11.22 300 each 3  . acetaminophen (TYLENOL) 500 MG tablet Take 500-1,000 mg by mouth every 4 (four) hours as needed for moderate pain.     Marland Kitchen albuterol (VENTOLIN HFA) 108 (90 Base) MCG/ACT inhaler Inhale 1-2 puffs into the lungs every 6 (six) hours as needed for wheezing or shortness of  breath.    Marland Kitchen aspirin 81 MG chewable tablet Chew 1 tablet (81 mg total) by mouth daily.    Marland Kitchen atorvastatin (LIPITOR) 10 MG tablet Take 1 tablet (10 mg total) by mouth every evening. 90 tablet 2  . B Complex-C (B-COMPLEX WITH VITAMIN C) tablet Take 1 tablet by mouth daily.    . Blood Glucose Calibration (ACCU-CHEK AVIVA) SOLN Use as directed dx: e11.22 1 each 3  . Blood Glucose Monitoring Suppl (ACCU-CHEK AVIVA PLUS) w/Device KIT Use as directed to check blood sugars 2 times per day dx: e11.22 1 kit 3  . carvedilol (COREG) 25 MG tablet Take 1 tablet (25  mg total) by mouth 2 (two) times daily with a meal. 180 tablet 3  . Cholecalciferol (VITAMIN D-3) 1000 units CAPS Take 1,000 Units by mouth daily.    . diclofenac sodium (VOLTAREN) 1 % GEL Apply 2 g topically 4 (four) times daily as needed (pain).    . DROPLET PEN NEEDLES 31G X 8 MM MISC USE DAILY WITH VICTOZA AND/OR NOVOLOG.  400 each 1  . furosemide (LASIX) 80 MG tablet Take 80 mg by mouth. 1 tab Monday - Friday    . glucose blood (ACCU-CHEK AVIVA PLUS) test strip Use as instructed to check blood sugars 2 times per day dx: e11.22 300 each 3  . hydrALAZINE (APRESOLINE) 25 MG tablet Take 2 tablets (50 mg total) by mouth 2 (two) times daily. 60 tablet 11  . hydrocortisone cream 1 % Apply 1 application topically daily as needed for itching.    . insulin lispro (HUMALOG KWIKPEN) 100 UNIT/ML KwikPen Inject 0.02-0.04 mLs (2-4 Units total) into the skin 3 (three) times daily as needed (high blood sugar over 150). 15 mL 2  . Isopropyl Alcohol (ALCOHOL WIPES) 70 % MISC Apply 1 each topically 2 (two) times daily. 300 each 3  . isosorbide mononitrate (IMDUR) 30 MG 24 hr tablet Take 1 tablet (30 mg total) by mouth daily. (Patient taking differently: Take 60 mg by mouth daily. ) 30 tablet 11  . lamoTRIgine (LAMICTAL) 150 MG tablet Take 1 tablet (150 mg total) by mouth 2 (two) times daily. 180 tablet 3  . levETIRAcetam (KEPPRA) 500 MG tablet Take 1 tablet (500 mg  total) by mouth 2 (two) times daily. (Patient taking differently: Take 500 mg by mouth at bedtime. ) 60 tablet 6  . Menthol, Topical Analgesic, (STOPAIN ROLL-ON EX) Apply 1 application topically daily as needed (pain).    . Multiple Vitamins-Minerals (MULTIVITAMIN WITH MINERALS) tablet Take 1 tablet by mouth daily.    Marland Kitchen neomycin-bacitracin-polymyxin (NEOSPORIN) ointment Apply 1 application topically as needed for wound care.    Marland Kitchen OZEMPIC, 1 MG/DOSE, 2 MG/1.5ML SOPN INJECT 1MG INTO THE SKIN ONCE A WEEK (Patient taking differently: Inject 1 mg into the muscle once a week. Thursday) 12 mL 0  . Pancrelipase, Lip-Prot-Amyl, (ZENPEP) 25000-79000 units CPEP Take 1-2 capsules by mouth See admin instructions. Take 2 capsules in the morning, 1 capsule with lunch, and 2 capsules in the evening.    . polyethylene glycol (MIRALAX / GLYCOLAX) 17 g packet Take 17 g by mouth daily as needed for moderate constipation or severe constipation. 14 each 0  . potassium chloride SA (KLOR-CON) 20 MEQ tablet Take 40 meq daily, when taking metolazone take a total of 60 meq daily (Patient taking differently: 40 mEq daily. Take 40 meq daily, when taking metolazone take a total of 60 meq daily ) 135 tablet 1  . sertraline (ZOLOFT) 50 MG tablet Take 1 tablet (50 mg total) by mouth daily. 90 tablet 2  . tolnaftate (TINACTIN) 1 % cream Apply 1 application topically daily as needed (foot fungus).     [name] SOLOSTAR 300 UNIT/ML Solostar Pen Inject 34 Units into the skin at bedtime.     No current facility-administered medications for this encounter.   BP 132/74   Pulse 64   Wt 99.4 kg (219 lb 3.2 oz)   SpO2 92%   BMI 41.42 kg/m    PHYSICAL EXAM: General:  Well appearing. No respiratory difficulty HEENT: normal Neck: supple. no JVD. Carotids 2+ bilat; no bruits. No lymphadenopathy  or thyromegaly appreciated. Cor: PMI nondisplaced. Regular rate & rhythm.  2/6 murmur right upper sternal border (aortic sclerosis without  stenosis noted on recent echocardiogram) lungs: clear, no wheezing Abdomen: soft, nontender, nondistended. No hepatosplenomegaly. No bruits or masses. Good bowel sounds. Extremities: no cyanosis, clubbing, rash, edema Neuro: alert & oriented x 3, cranial nerves grossly intact. moves all 4 extremities w/o difficulty. Affect pleasant.    1. Chronic diastolic heart failure: Echo 5/21 with EF 60-65%, severely dilated RV. RHC/LHC in 10/20 showed mild-moderate pulmonary venous hypertension and no significant coronary disease. Cause of RV dysfunction is not clear, possibly due to diastolic CHF + OHS/OSA. V/Q scan in 2/21 did not show acute or chronic PE, PFTs in 5/21 were normal. RHC 5/21 showed normal right and left heart filling pressures, High output on RHC 5/17 but no evidence for intracardiac shunt on shunt run. -NYHA class II-III -Euvolemic on exam.  BNP normal at 81.  PA diastolic pressure by CardioMEMS sensor 21 mmHg today, however goal not yet set  -Continue torsemide 40 mg daily.  -Continue daily Cardiomems transmission  -Check basic metabolic panel today 3. Pulmonary hypertension: Recent RHC 5/21 with moderate pulmonary hypertension, suspect group 2/group 3 in setting of diastolic CHF, OHS/OSA, and high cardiac output.   -There is no role for selective pulmonary vasodilators.  -Continue CPAP nightly 4. Hypertension -BP has been mildly elevated at home, in the upper 808U 110R systolic - Continue coreg 25 mg BID,   -Increase hydralazine  to 75 mg BID - Continue Imdur 30 daily  5. OHS/OSA: on CPAP but no oxygen 6. Seizure d/o: Has had both seizures and pseudoseizures per notes. -Recent EEG 5/21 completed and negative  - Per neurology, no changes in antiepileptic medications at this time.  -Continue Keppra 500 mg twice daily -Continue Lamictal 150 mg twice daily - no driving per  DMV statutes  -Follow-up with neurology 7. Old CVA: Recent MRI MRI with evidence for prior CVA (nothing  acute).  - Continue statin. -Continue ASA 81.   -Follow-up with neurology  Keep planned follow-up appointment with Dr. [name] in 2 weeks   Lyda Jester, PA-C  12/03/2019

## 2019-12-08 ENCOUNTER — Other Ambulatory Visit: Payer: Self-pay

## 2019-12-08 ENCOUNTER — Telehealth: Payer: Self-pay

## 2019-12-08 ENCOUNTER — Ambulatory Visit: Payer: Self-pay

## 2019-12-08 DIAGNOSIS — F339 Major depressive disorder, recurrent, unspecified: Secondary | ICD-10-CM

## 2019-12-08 DIAGNOSIS — G40909 Epilepsy, unspecified, not intractable, without status epilepticus: Secondary | ICD-10-CM | POA: Diagnosis not present

## 2019-12-08 DIAGNOSIS — I130 Hypertensive heart and chronic kidney disease with heart failure and stage 1 through stage 4 chronic kidney disease, or unspecified chronic kidney disease: Secondary | ICD-10-CM | POA: Diagnosis not present

## 2019-12-08 DIAGNOSIS — E1122 Type 2 diabetes mellitus with diabetic chronic kidney disease: Secondary | ICD-10-CM | POA: Diagnosis not present

## 2019-12-08 DIAGNOSIS — J449 Chronic obstructive pulmonary disease, unspecified: Secondary | ICD-10-CM | POA: Diagnosis not present

## 2019-12-08 DIAGNOSIS — N182 Chronic kidney disease, stage 2 (mild): Secondary | ICD-10-CM | POA: Diagnosis not present

## 2019-12-08 DIAGNOSIS — I5032 Chronic diastolic (congestive) heart failure: Secondary | ICD-10-CM

## 2019-12-08 DIAGNOSIS — J9611 Chronic respiratory failure with hypoxia: Secondary | ICD-10-CM | POA: Diagnosis not present

## 2019-12-08 DIAGNOSIS — D631 Anemia in chronic kidney disease: Secondary | ICD-10-CM | POA: Diagnosis not present

## 2019-12-08 DIAGNOSIS — I5082 Biventricular heart failure: Secondary | ICD-10-CM | POA: Diagnosis not present

## 2019-12-09 NOTE — Telephone Encounter (Signed)
Encounter open in error 

## 2019-12-09 NOTE — Chronic Care Management (AMB) (Signed)
Chronic Care Management   Follow Up Note   12/09/2019 Name: [name] MRN: [id number] DOB: September 10, 1945  Referred by: [name], MD Reason for referral : Chronic Care Management (FU RN CM Call )   [name] is a 74 y.o. year old female who is a primary care patient of [name], MD. The CCM team was consulted for assistance with chronic disease management and care coordination needs.    Review of patient status, including review of consultants reports, relevant laboratory and other test results, and collaboration with appropriate care team members and the patient's provider was performed as part of comprehensive patient evaluation and provision of chronic care management services.    SDOH (Social Determinants of Health) assessments performed: No See Care Plan activities for detailed interventions related to [facility])   Reviewed chart in preparation to contact patient. CCM RN CM follow up call rescheduled to coordinate with upcoming MD f/u appointments for evaluation of multiple comorbidities. .     Outpatient Encounter Medications as of 12/08/2019  Medication Sig  . Accu-Chek Softclix Lancets lancets Use as instructed to check blood sugars 2 times per day dx: e11.22  . acetaminophen (TYLENOL) 500 MG tablet Take 500-1,000 mg by mouth every 4 (four) hours as needed for moderate pain.   Marland Kitchen albuterol (VENTOLIN HFA) 108 (90 Base) MCG/ACT inhaler Inhale 1-2 puffs into the lungs every 6 (six) hours as needed for wheezing or shortness of breath.  Marland Kitchen aspirin 81 MG chewable tablet Chew 1 tablet (81 mg total) by mouth daily.  Marland Kitchen atorvastatin (LIPITOR) 10 MG tablet Take 1 tablet (10 mg total) by mouth every evening.  . B Complex-C (B-COMPLEX WITH VITAMIN C) tablet Take 1 tablet by mouth daily.  . Blood Glucose Calibration (ACCU-CHEK AVIVA) SOLN Use as directed dx: e11.22  . Blood Glucose Monitoring Suppl (ACCU-CHEK AVIVA PLUS) w/Device KIT Use as directed to check blood sugars 2 times  per day dx: e11.22  . carvedilol (COREG) 25 MG tablet Take 1 tablet (25 mg total) by mouth 2 (two) times daily with a meal.  . Cholecalciferol (VITAMIN D-3) 1000 units CAPS Take 1,000 Units by mouth daily.  . diclofenac sodium (VOLTAREN) 1 % GEL Apply 2 g topically 4 (four) times daily as needed (pain).  . DROPLET PEN NEEDLES 31G X 8 MM MISC USE DAILY WITH VICTOZA AND/OR NOVOLOG.   . furosemide (LASIX) 80 MG tablet Take 80 mg by mouth. 1 tab Monday - Friday  . glucose blood (ACCU-CHEK AVIVA PLUS) test strip Use as instructed to check blood sugars 2 times per day dx: e11.22  . hydrALAZINE (APRESOLINE) 25 MG tablet Take 3 tablets (75 mg total) by mouth 3 (three) times daily.  . hydrocortisone cream 1 % Apply 1 application topically daily as needed for itching.  . insulin lispro (HUMALOG KWIKPEN) 100 UNIT/ML KwikPen Inject 0.02-0.04 mLs (2-4 Units total) into the skin 3 (three) times daily as needed (high blood sugar over 150).  . Isopropyl Alcohol (ALCOHOL WIPES) 70 % MISC Apply 1 each topically 2 (two) times daily.  . isosorbide mononitrate (IMDUR) 30 MG 24 hr tablet Take 1 tablet (30 mg total) by mouth daily. (Patient taking differently: Take 60 mg by mouth daily. )  . lamoTRIgine (LAMICTAL) 150 MG tablet Take 1 tablet (150 mg total) by mouth 2 (two) times daily.  Marland Kitchen levETIRAcetam (KEPPRA) 500 MG tablet Take 1 tablet (500 mg total) by mouth 2 (two) times daily. (Patient taking differently: Take 500 mg  by mouth at bedtime. )  . Menthol, Topical Analgesic, (STOPAIN ROLL-ON EX) Apply 1 application topically daily as needed (pain).  . Multiple Vitamins-Minerals (MULTIVITAMIN WITH MINERALS) tablet Take 1 tablet by mouth daily.  Marland Kitchen neomycin-bacitracin-polymyxin (NEOSPORIN) ointment Apply 1 application topically as needed for wound care.  Marland Kitchen OZEMPIC, 1 MG/DOSE, 2 MG/1.5ML SOPN INJECT 1MG INTO THE SKIN ONCE A WEEK (Patient taking differently: Inject 1 mg into the muscle once a week. Thursday)  . Pancrelipase,  Lip-Prot-Amyl, (ZENPEP) 25000-79000 units CPEP Take 1-2 capsules by mouth See admin instructions. Take 2 capsules in the morning, 1 capsule with lunch, and 2 capsules in the evening.  . polyethylene glycol (MIRALAX / GLYCOLAX) 17 g packet Take 17 g by mouth daily as needed for moderate constipation or severe constipation.  . potassium chloride SA (KLOR-CON) 20 MEQ tablet Take 40 meq daily, when taking metolazone take a total of 60 meq daily (Patient taking differently: 40 mEq daily. Take 40 meq daily, when taking metolazone take a total of 60 meq daily )  . sertraline (ZOLOFT) 50 MG tablet Take 1 tablet (50 mg total) by mouth daily.  Marland Kitchen tolnaftate (TINACTIN) 1 % cream Apply 1 application topically daily as needed (foot fungus).   [name] SOLOSTAR 300 UNIT/ML Solostar Pen Inject 34 Units into the skin at bedtime.   No facility-administered encounter medications on file as of 12/08/2019.     Objective:  Lab Results  Component Value Date   HGBA1C 7.6 (H) 11/09/2019   HGBA1C 8.0 (H) 08/24/2019   HGBA1C 7.5 (H) 05/19/2019   Lab Results  Component Value Date   MICROALBUR 10 10/22/2018   LDLCALC 58 11/09/2019   CREATININE 1.04 (H) 12/03/2019   BP Readings from Last 3 Encounters:  12/03/19 132/74  11/17/19 116/70  11/11/19 (!) 100/45   Plan:   Telephone follow up appointment with care management team member scheduled for: 01/08/20  Barb Merino, RN, BSN, CCM Care Management Coordinator [place] [facility]  Direct Phone: [phone]

## 2019-12-11 ENCOUNTER — Other Ambulatory Visit: Payer: Self-pay

## 2019-12-11 DIAGNOSIS — G40909 Epilepsy, unspecified, not intractable, without status epilepticus: Secondary | ICD-10-CM | POA: Diagnosis not present

## 2019-12-11 DIAGNOSIS — N182 Chronic kidney disease, stage 2 (mild): Secondary | ICD-10-CM | POA: Diagnosis not present

## 2019-12-11 DIAGNOSIS — J449 Chronic obstructive pulmonary disease, unspecified: Secondary | ICD-10-CM | POA: Diagnosis not present

## 2019-12-11 DIAGNOSIS — J9611 Chronic respiratory failure with hypoxia: Secondary | ICD-10-CM | POA: Diagnosis not present

## 2019-12-11 DIAGNOSIS — I5032 Chronic diastolic (congestive) heart failure: Secondary | ICD-10-CM | POA: Diagnosis not present

## 2019-12-11 DIAGNOSIS — E1122 Type 2 diabetes mellitus with diabetic chronic kidney disease: Secondary | ICD-10-CM | POA: Diagnosis not present

## 2019-12-11 DIAGNOSIS — D631 Anemia in chronic kidney disease: Secondary | ICD-10-CM | POA: Diagnosis not present

## 2019-12-11 DIAGNOSIS — I5082 Biventricular heart failure: Secondary | ICD-10-CM | POA: Diagnosis not present

## 2019-12-11 DIAGNOSIS — I130 Hypertensive heart and chronic kidney disease with heart failure and stage 1 through stage 4 chronic kidney disease, or unspecified chronic kidney disease: Secondary | ICD-10-CM | POA: Diagnosis not present

## 2019-12-11 MED ORDER — CARVEDILOL 25 MG PO TABS: 25.0000 mg | ORAL_TABLET | Freq: Two times a day (BID) | ORAL | 2 refills | Status: DC

## 2019-12-14 ENCOUNTER — Other Ambulatory Visit: Payer: Self-pay | Admitting: Cardiology

## 2019-12-14 DIAGNOSIS — E1122 Type 2 diabetes mellitus with diabetic chronic kidney disease: Secondary | ICD-10-CM | POA: Diagnosis not present

## 2019-12-14 DIAGNOSIS — J449 Chronic obstructive pulmonary disease, unspecified: Secondary | ICD-10-CM | POA: Diagnosis not present

## 2019-12-14 DIAGNOSIS — I5082 Biventricular heart failure: Secondary | ICD-10-CM | POA: Diagnosis not present

## 2019-12-14 DIAGNOSIS — I130 Hypertensive heart and chronic kidney disease with heart failure and stage 1 through stage 4 chronic kidney disease, or unspecified chronic kidney disease: Secondary | ICD-10-CM | POA: Diagnosis not present

## 2019-12-14 DIAGNOSIS — N182 Chronic kidney disease, stage 2 (mild): Secondary | ICD-10-CM | POA: Diagnosis not present

## 2019-12-14 DIAGNOSIS — J9611 Chronic respiratory failure with hypoxia: Secondary | ICD-10-CM | POA: Diagnosis not present

## 2019-12-14 DIAGNOSIS — G40909 Epilepsy, unspecified, not intractable, without status epilepticus: Secondary | ICD-10-CM | POA: Diagnosis not present

## 2019-12-14 DIAGNOSIS — I5032 Chronic diastolic (congestive) heart failure: Secondary | ICD-10-CM | POA: Diagnosis not present

## 2019-12-14 DIAGNOSIS — D631 Anemia in chronic kidney disease: Secondary | ICD-10-CM | POA: Diagnosis not present

## 2019-12-15 ENCOUNTER — Ambulatory Visit: Payer: Medicare HMO | Admitting: Sports Medicine

## 2019-12-15 DIAGNOSIS — E1122 Type 2 diabetes mellitus with diabetic chronic kidney disease: Secondary | ICD-10-CM | POA: Diagnosis not present

## 2019-12-15 DIAGNOSIS — G40909 Epilepsy, unspecified, not intractable, without status epilepticus: Secondary | ICD-10-CM | POA: Diagnosis not present

## 2019-12-15 DIAGNOSIS — I5032 Chronic diastolic (congestive) heart failure: Secondary | ICD-10-CM | POA: Diagnosis not present

## 2019-12-15 DIAGNOSIS — I5082 Biventricular heart failure: Secondary | ICD-10-CM | POA: Diagnosis not present

## 2019-12-15 DIAGNOSIS — J449 Chronic obstructive pulmonary disease, unspecified: Secondary | ICD-10-CM | POA: Diagnosis not present

## 2019-12-15 DIAGNOSIS — J9611 Chronic respiratory failure with hypoxia: Secondary | ICD-10-CM | POA: Diagnosis not present

## 2019-12-15 DIAGNOSIS — D631 Anemia in chronic kidney disease: Secondary | ICD-10-CM | POA: Diagnosis not present

## 2019-12-15 DIAGNOSIS — I130 Hypertensive heart and chronic kidney disease with heart failure and stage 1 through stage 4 chronic kidney disease, or unspecified chronic kidney disease: Secondary | ICD-10-CM | POA: Diagnosis not present

## 2019-12-15 DIAGNOSIS — N182 Chronic kidney disease, stage 2 (mild): Secondary | ICD-10-CM | POA: Diagnosis not present

## 2019-12-16 ENCOUNTER — Other Ambulatory Visit: Payer: Self-pay | Admitting: Internal Medicine

## 2019-12-16 ENCOUNTER — Other Ambulatory Visit: Payer: Self-pay | Admitting: Cardiology

## 2019-12-17 ENCOUNTER — Ambulatory Visit (HOSPITAL_COMMUNITY)
Admission: RE | Admit: 2019-12-17 | Discharge: 2019-12-17 | Disposition: A | Discharge status: Home / Self Care | Payer: Medicare HMO | Source: Ambulatory Visit | Attending: Cardiology | Admitting: Cardiology

## 2019-12-17 ENCOUNTER — Encounter (HOSPITAL_COMMUNITY): Payer: Self-pay | Admitting: Cardiology

## 2019-12-17 ENCOUNTER — Other Ambulatory Visit: Payer: Self-pay

## 2019-12-17 VITALS — BP 140/65 | HR 60 | Wt 215.0 lb

## 2019-12-17 DIAGNOSIS — G4733 Obstructive sleep apnea (adult) (pediatric): Secondary | ICD-10-CM | POA: Insufficient documentation

## 2019-12-17 DIAGNOSIS — G40909 Epilepsy, unspecified, not intractable, without status epilepticus: Secondary | ICD-10-CM | POA: Insufficient documentation

## 2019-12-17 DIAGNOSIS — Z7901 Long term (current) use of anticoagulants: Secondary | ICD-10-CM | POA: Diagnosis not present

## 2019-12-17 DIAGNOSIS — I5032 Chronic diastolic (congestive) heart failure: Secondary | ICD-10-CM | POA: Diagnosis not present

## 2019-12-17 DIAGNOSIS — I272 Pulmonary hypertension, unspecified: Secondary | ICD-10-CM

## 2019-12-17 DIAGNOSIS — E785 Hyperlipidemia, unspecified: Secondary | ICD-10-CM | POA: Diagnosis not present

## 2019-12-17 DIAGNOSIS — Z8673 Personal history of transient ischemic attack (TIA), and cerebral infarction without residual deficits: Secondary | ICD-10-CM | POA: Diagnosis not present

## 2019-12-17 DIAGNOSIS — I110 Hypertensive heart disease with heart failure: Secondary | ICD-10-CM | POA: Insufficient documentation

## 2019-12-17 DIAGNOSIS — Z79899 Other long term (current) drug therapy: Secondary | ICD-10-CM | POA: Insufficient documentation

## 2019-12-17 DIAGNOSIS — R0789 Other chest pain: Secondary | ICD-10-CM | POA: Insufficient documentation

## 2019-12-17 DIAGNOSIS — Z7982 Long term (current) use of aspirin: Secondary | ICD-10-CM | POA: Insufficient documentation

## 2019-12-17 DIAGNOSIS — Z794 Long term (current) use of insulin: Secondary | ICD-10-CM | POA: Insufficient documentation

## 2019-12-17 DIAGNOSIS — Z87891 Personal history of nicotine dependence: Secondary | ICD-10-CM | POA: Insufficient documentation

## 2019-12-17 DIAGNOSIS — E119 Type 2 diabetes mellitus without complications: Secondary | ICD-10-CM | POA: Diagnosis not present

## 2019-12-17 DIAGNOSIS — F329 Major depressive disorder, single episode, unspecified: Secondary | ICD-10-CM | POA: Insufficient documentation

## 2019-12-17 DIAGNOSIS — K921 Melena: Secondary | ICD-10-CM | POA: Diagnosis not present

## 2019-12-17 LAB — CBC
HCT: 31.5 % — ABNORMAL LOW (ref 36.0–46.0)
Hemoglobin: 9.5 g/dL — ABNORMAL LOW (ref 12.0–15.0)
MCH: 22.9 pg — ABNORMAL LOW (ref 26.0–34.0)
MCHC: 30.2 g/dL (ref 30.0–36.0)
MCV: 76.1 fL — ABNORMAL LOW (ref 80.0–100.0)
Platelets: 303 10*3/uL (ref 150–400)
RBC: 4.14 MIL/uL (ref 3.87–5.11)
RDW: 17.7 % — ABNORMAL HIGH (ref 11.5–15.5)
WBC: 4.9 10*3/uL (ref 4.0–10.5)
nRBC: 0 % (ref 0.0–0.2)

## 2019-12-17 LAB — LIPID PANEL
Cholesterol: 150 mg/dL (ref 0–200)
HDL: 76 mg/dL (ref >40)
LDL Cholesterol: 65 mg/dL (ref 0–99)
Total CHOL/HDL Ratio: 2 RATIO
Triglycerides: 47 mg/dL (ref <150)
VLDL: 9 mg/dL (ref 0–40)

## 2019-12-17 NOTE — Patient Instructions (Signed)
Labs done today. We will contact you only if your labs are abnormal.  No medication changes were made. Please continue all current medications as prescribed.  You have been referred to [facility]. That office will contact you to schedule an appointment.  Your physician recommends that you schedule a follow-up appointment in: 3 months.  If you have any questions or concerns before your next appointment please send [place] a message through [place] or call our office at [phone].    TO LEAVE A MESSAGE FOR THE NURSE SELECT OPTION 2, PLEASE LEAVE A MESSAGE INCLUDING: . YOUR NAME . DATE OF BIRTH . CALL BACK NUMBER . REASON FOR CALL**this is important as we prioritize the call backs  [place] AS LONG AS YOU CALL BEFORE 4:00 PM   At the [facility], you and your health needs are our priority. As part of our continuing mission to provide you with exceptional heart care, we have created designated Provider Care Teams. These Care Teams include your primary Cardiologist (physician) and Advanced Practice Providers (APPs- Physician Assistants and Nurse Practitioners) who all work together to provide you with the care you need, when you need it.   You may see any of the following providers on your designated Care Team at your next follow up: Marland Kitchen Dr Glori Bickers . Dr Loralie Champagne . Darrick Grinder, NP . Lyda Jester, PA . Audry Riles, PharmD   Please be sure to bring in all your medications bottles to every appointment.

## 2019-12-17 NOTE — Progress Notes (Signed)
PCP: [name], MD Cardiology: Dr. Ellyn Hack HF Cardiology: Dr. [name]  74 y.o. with history of OHS/OSA and RV dysfunction was referred by Dr. Ellyn Hack for evaluation of CHF.  She has a history of OSA/OHS.  She uses CPAP at night but not currently using oxygen during the day.  Remote smoker.  She had an echo in 9/20 showing EF 50-55% but moderate-severe RV dysfunction with mild-moderate RV dilation.  RHC/LHC in 10/20 showed normal coronaries, pulmonary venous hypertension.  V/Q scan in 2/21 did not show evidence for acute on chronic PE.    Cardiomems was placed in 5/21.  RHC at that time showed normal filling pressures and moderate pulmonary hypertension but suspect due to high output.  No evidence for shunt lesion.  Echo showed EF 60-65%, enlarged RV with normal systolic function.   She was admitted in 5/21 with syncope, thought to be due to a seizure.   She returns for followup of CHF.  Weight is down 6 lbs.  PADP today is 13-14 by Cardiomems.   Occasional atypical chest pain.  She feels weak and fatigued generally.  Dyspnea seems to come and go, generally ok walking on flat ground. Occasional dizzy episodes, sound like vertigo (spinning sensation when she lies down).  No further seizures. She has had episodes of BRBPR for the last 1-2 weeks.   Labs (10/20): K 4.1, creatinine 1.15, hgb 11.4 Labs (6/21): K 3.5, creatinine 1.04, BNP 81  PMH: 1. OHS/OSA: She uses CPAP.  2. H/o CVA 3. Seizure disorder 4. Type 2 DM 5. Depression 6. HTN 7. Hyperlipidemia 8. Chronic diastolic CHF: Echo (6/76) with EF 50-55%, moderate to severe RV dilation with mild-moderately decreased RV systolic function.  - RHC/LHC (10/20): normal coronaries; mean RA 10, PA 56/14 mean 31, mean PCWP 13, LVEDP 18, CO/CI 7.35/3.73, PVR 2.44.  - RHC/Cardiomems placement (5/21): mean RA 7, PA 58/21 mean 34, PCWP mean 12, CI 8.3 F/3.95 T, PVR 2.86 WU. No evidence for shunt lesion.  - Echo (5/21): EF 60-65%, RV enlarged with  normal systolic function.  9. Primarily pulmonary venous hypertension.  - V/Q scan (2/21): No evidence for acute or chronic PE.  - PFTs normal in 5/21.   Social History   Socioeconomic History  . Marital status: Divorced    Spouse name: Not on file  . Number of children: Not on file  . Years of education: Not on file  . Highest education level: Not on file  Occupational History  . Occupation: Disabled    Comment: CNA  Tobacco Use  . Smoking status: Former Smoker    Packs/day: 0.50    Years: 25.00    Pack years: 12.50    Types: Cigarettes  . Smokeless tobacco: Never Used  . Tobacco comment: quit smoking 20+ytrs ago  Vaping Use  . Vaping Use: Never used  Substance and Sexual Activity  . Alcohol use: No  . Drug use: No  . Sexual activity: Not Currently    Birth control/protection: Surgical  Other Topics Concern  . Not on file  Social History Narrative   She lives in [place]. She has lots of family in the area and is accompanied by her sister.   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   She has a distant history of smoking, quit 20 years ago.   Social Determinants of Health   Financial Resource Strain:   . Difficulty of Paying Living Expenses:   Food Insecurity:   .  Worried About Charity fundraiser in the Last Year:   . Arboriculturist in the Last Year:   Transportation Needs:   . Film/video editor (Medical):   Marland Kitchen Lack of Transportation (Non-Medical):   Physical Activity:   . Days of Exercise per Week:   . Minutes of Exercise per Session:   Stress:   . Feeling of Stress :   Social Connections:   . Frequency of Communication with Friends and Family:   . Frequency of Social Gatherings with Friends and Family:   . Attends Religious Services:   . Active Member of Clubs or Organizations:   . Attends Archivist Meetings:   Marland Kitchen Marital Status:   Intimate Partner Violence:   . Fear of Current or Ex-Partner:   .  Emotionally Abused:   Marland Kitchen Physically Abused:   . Sexually Abused:    Family History  Problem Relation Age of Onset  . Allergies Father   . Heart disease Father        before 4  . Heart failure Father   . Hypertension Father   . Hyperlipidemia Father   . Heart disease Mother   . Diabetes Mother   . Hypertension Mother   . Hyperlipidemia Mother   . Hypertension Sister   . Heart disease Sister        before 66  . Hyperlipidemia Sister   . Diabetes Brother   . Hypertension Brother   . Diabetes Sister   . Hypertension Sister   . Diabetes Son   . Hypertension Son   . Cancer Other    Current Outpatient Medications  Medication Sig Dispense Refill  . Accu-Chek Softclix Lancets lancets Use as instructed to check blood sugars 2 times per day dx: e11.22 300 each 3  . acetaminophen (TYLENOL) 500 MG tablet Take 500-1,000 mg by mouth every 4 (four) hours as needed for moderate pain.     Marland Kitchen albuterol (VENTOLIN HFA) 108 (90 Base) MCG/ACT inhaler Inhale 1-2 puffs into the lungs every 6 (six) hours as needed for wheezing or shortness of breath.    Marland Kitchen aspirin 81 MG chewable tablet Chew 1 tablet (81 mg total) by mouth daily.    Marland Kitchen atorvastatin (LIPITOR) 10 MG tablet Take 1 tablet (10 mg total) by mouth every evening. 90 tablet 2  . B Complex-C (B-COMPLEX WITH VITAMIN C) tablet Take 1 tablet by mouth daily.    . B-D ULTRAFINE III SHORT PEN 31G X 8 MM MISC USE DAILY WITH VICTOZA AND/OR NOVOLOG. 400 each 1  . Blood Glucose Calibration (ACCU-CHEK AVIVA) SOLN Use as directed dx: e11.22 1 each 3  . Blood Glucose Monitoring Suppl (ACCU-CHEK AVIVA PLUS) w/Device KIT Use as directed to check blood sugars 2 times per day dx: e11.22 1 kit 3  . carvedilol (COREG) 25 MG tablet TAKE 1 TABLET BY MOUTH TWICE DAILY WITH MEALS 180 tablet 0  . Cholecalciferol (VITAMIN D-3) 1000 units CAPS Take 1,000 Units by mouth daily.    . diclofenac sodium (VOLTAREN) 1 % GEL Apply 2 g topically 4 (four) times daily as needed  (pain).    . furosemide (LASIX) 80 MG tablet Take 80 mg by mouth. 1 tab Monday - Friday    . glucose blood (ACCU-CHEK AVIVA PLUS) test strip Use as instructed to check blood sugars 2 times per day dx: e11.22 300 each 3  . hydrALAZINE (APRESOLINE) 25 MG tablet Take 3 tablets (75 mg total) by mouth 3 (three)  times daily. 270 tablet 3  . hydrocortisone cream 1 % Apply 1 application topically daily as needed for itching.    . insulin lispro (HUMALOG KWIKPEN) 100 UNIT/ML KwikPen Inject 0.02-0.04 mLs (2-4 Units total) into the skin 3 (three) times daily as needed (high blood sugar over 150). 15 mL 2  . Isopropyl Alcohol (ALCOHOL WIPES) 70 % MISC Apply 1 each topically 2 (two) times daily. 300 each 3  . isosorbide mononitrate (IMDUR) 30 MG 24 hr tablet Take 1 tablet (30 mg total) by mouth daily. (Patient taking differently: Take 60 mg by mouth daily. ) 30 tablet 11  . lamoTRIgine (LAMICTAL) 150 MG tablet Take 1 tablet (150 mg total) by mouth 2 (two) times daily. 180 tablet 3  . levETIRAcetam (KEPPRA) 500 MG tablet Take 1 tablet (500 mg total) by mouth 2 (two) times daily. (Patient taking differently: Take 500 mg by mouth at bedtime. ) 60 tablet 6  . Menthol, Topical Analgesic, (STOPAIN ROLL-ON EX) Apply 1 application topically daily as needed (pain).    . Multiple Vitamins-Minerals (MULTIVITAMIN WITH MINERALS) tablet Take 1 tablet by mouth daily.    Marland Kitchen neomycin-bacitracin-polymyxin (NEOSPORIN) ointment Apply 1 application topically as needed for wound care.    Marland Kitchen OZEMPIC, 1 MG/DOSE, 2 MG/1.5ML SOPN INJECT 1MG INTO THE SKIN ONCE A WEEK (Patient taking differently: Inject 1 mg into the muscle once a week. Thursday) 12 mL 0  . Pancrelipase, Lip-Prot-Amyl, (ZENPEP) 25000-79000 units CPEP Take 1-2 capsules by mouth See admin instructions. Take 2 capsules in the morning, 1 capsule with lunch, and 2 capsules in the evening.    . polyethylene glycol (MIRALAX / GLYCOLAX) 17 g packet Take 17 g by mouth daily as needed  for moderate constipation or severe constipation. 14 each 0  . potassium chloride SA (KLOR-CON) 20 MEQ tablet TAKE 2 TABLETS EVERY DAY BUT WHEN TAKING METOLAZONE TAKE 3 TABLETS DAILY AS DIRECTED 204 tablet 2  . sertraline (ZOLOFT) 50 MG tablet Take 1 tablet (50 mg total) by mouth daily. 90 tablet 2  . tolnaftate (TINACTIN) 1 % cream Apply 1 application topically daily as needed (foot fungus).     [name] SOLOSTAR 300 UNIT/ML Solostar Pen INJECT 36 UNITS INTO THE SKIN AT BEDTIME. 9 pen 2   No current facility-administered medications for this encounter.   BP 140/65   Pulse 60   Wt 97.5 kg (215 lb)   SpO2 95%   BMI 40.62 kg/m  General: NAD Neck: No JVD, no thyromegaly or thyroid nodule.  Lungs: Clear to auscultation bilaterally with normal respiratory effort. CV: Nondisplaced PMI.  Heart regular S1/S2, no S3/S4, 2/6 early SEM RUSB.  No peripheral edema.  No carotid bruit.  Normal pedal pulses.  Abdomen: Soft, nontender, no hepatosplenomegaly, no distention.  Skin: Intact without lesions or rashes.  Neurologic: Alert and oriented x 3.  Psych: Normal affect. Extremities: No clubbing or cyanosis.  HEENT: Normal.   Assessment/Plan: 1. Chronic diastolic CHF/RV failure: Echo in 9/20 showed EF 50-55% with moderate-severe RV dilation and mild-moderately decreased systolic function. RHC/LHC showed mild-moderate pulmonary venous hypertension and no significant coronary disease.  Cause of RV dysfunction is not clear, possibly due to diastolic CHF + OHS/OSA.  V/Q scan in 2/21 did not show acute or chronic PE.  Cardiomems was placed in 5/21, normal filling pressures with moderate pulmonary hypertension likely due to high output (no evidence for shunt lesion on RHC).  She is not volume overloaded today by exam or Cardiomems, NYHA  class II symptoms.   - Continue Lasix 80 mg daily. BMET today.   2. OHS/OSA: She is using CPAP but not oxygen during the day.  Oxygen saturation not low at rest. PFTs were  normal in 5/21.  3. Pulmonary hypertension: RHC in 10/20 showed mild-moderate pulmonary venous hypertension.  RHC in 5/21 showed moderate pulmonary hypertension with low PVR and high CO, likely PH due to high output, no evidence for shunt lesion. No role for selective pulmonary vasodilators.  4. Hematochezia: Will have her see her GI MD, Dr. Paulita Fujita.   Followup in 3 months with NP/PA  Loralie Champagne 12/17/2019

## 2019-12-21 ENCOUNTER — Ambulatory Visit (INDEPENDENT_AMBULATORY_CARE_PROVIDER_SITE_OTHER): Payer: Medicare HMO | Admitting: Internal Medicine

## 2019-12-21 ENCOUNTER — Telehealth: Payer: Self-pay | Admitting: Cardiology

## 2019-12-21 ENCOUNTER — Other Ambulatory Visit: Payer: Self-pay

## 2019-12-21 ENCOUNTER — Telehealth: Payer: Self-pay | Admitting: Neurology

## 2019-12-21 ENCOUNTER — Ambulatory Visit: Payer: Self-pay

## 2019-12-21 ENCOUNTER — Telehealth: Payer: Self-pay

## 2019-12-21 ENCOUNTER — Encounter: Payer: Self-pay | Admitting: Internal Medicine

## 2019-12-21 VITALS — BP 116/70 | HR 63 | Temp 98.2°F | Ht 61.0 in | Wt 214.8 lb

## 2019-12-21 DIAGNOSIS — Z79899 Other long term (current) drug therapy: Secondary | ICD-10-CM

## 2019-12-21 DIAGNOSIS — G40009 Localization-related (focal) (partial) idiopathic epilepsy and epileptic syndromes with seizures of localized onset, not intractable, without status epilepticus: Secondary | ICD-10-CM

## 2019-12-21 DIAGNOSIS — E1122 Type 2 diabetes mellitus with diabetic chronic kidney disease: Secondary | ICD-10-CM

## 2019-12-21 DIAGNOSIS — N182 Chronic kidney disease, stage 2 (mild): Secondary | ICD-10-CM

## 2019-12-21 DIAGNOSIS — K219 Gastro-esophageal reflux disease without esophagitis: Secondary | ICD-10-CM

## 2019-12-21 DIAGNOSIS — Z794 Long term (current) use of insulin: Secondary | ICD-10-CM | POA: Diagnosis not present

## 2019-12-21 DIAGNOSIS — G40909 Epilepsy, unspecified, not intractable, without status epilepticus: Secondary | ICD-10-CM

## 2019-12-21 DIAGNOSIS — I130 Hypertensive heart and chronic kidney disease with heart failure and stage 1 through stage 4 chronic kidney disease, or unspecified chronic kidney disease: Secondary | ICD-10-CM | POA: Diagnosis not present

## 2019-12-21 DIAGNOSIS — I110 Hypertensive heart disease with heart failure: Secondary | ICD-10-CM

## 2019-12-21 DIAGNOSIS — I5032 Chronic diastolic (congestive) heart failure: Secondary | ICD-10-CM

## 2019-12-21 MED ORDER — LEVETIRACETAM 500 MG PO TABS: ORAL_TABLET | ORAL | 3 refills | Status: DC

## 2019-12-21 MED ORDER — DEXILANT 60 MG PO CPDR: 60.0000 mg | DELAYED_RELEASE_CAPSULE | Freq: Every day | ORAL | 0 refills | Status: DC

## 2019-12-21 NOTE — Telephone Encounter (Signed)
The front office does not sch the 24 hour 48 hour or 72 hour EEG. [name] Does the scheduling for those types of EEG. The front office only schedules for the routine or sleep deprive EEG. So I have routed the message to [name] to see if she would be able to sch the 48 hour EEG.

## 2019-12-21 NOTE — Telephone Encounter (Signed)
Pls let her know that our EEG tech Manuela Schwartz will contact them to schedule the 48-hour EEG. In the meantime, pls have her increase the Keppra 500mg : Take 2 tabs qhs. She had side effects when she took it twice a day, hopefully now that she has been on it longer, she will tolerate it better, but also taking both at night so she just sleeps if she feels drowsy. Pls send in Rx for updated dose, thanks

## 2019-12-21 NOTE — Chronic Care Management (AMB) (Signed)
Chronic Care Management Pharmacy  Name: [name]  MRN: [id number] DOB: 1945-09-07  Chief Complaint/ HPI  [name],  74 y.o. , female presents with her sister, [name], for their Follow-Up CCM visit with the clinical pharmacist In office.  PCP : [name], MD  Their chronic conditions include: Hypertension, Chronic diastolic CHF, Diabetes, Seizure disorder, Rheumatoid arthritis, Dyslipidemia  Office Visits: 11/17/19 OV: Presented for hospitalization follow up. No seizure activity since discharge. Pt reported she could not tolerate Keppra 510m twice daily and that Neurology said she could take 1/2 tablet in the morning and 1 tablet in the evening. Pt noted memory loss as a problem. Advised that it could be related to seizure activity or elevated blood sugars. Was discharged on Lantus, but advised to resume Toujeo as originally prescribed. Advised to check blood sugar every morning in order for insulin dose adjustments to be made. Labs ordered (BMP8+EGFR, CBC no Diff).   09/28/19 OV: Presented for evaluation of back and foot pain. Chronic left-sided sciatica flare. Administered Kenalog 642mIM. Advised to get magnesium oil to rub on low back. Consider Neurosurgery evaluation. Referred to Podiatry for orthotics. Importance of medication adherence stressed. BP uncontrolled, but pt had not taken fluid pill.    08/24/19 OV: Presented for DM and HTN follow up. Reported Ed prescribed Keppra for seizures on 2/6, but pt has not started medication. Pt advised to take medication as prescribed by ED physician. Will try to move up Neuro appt. Labs ordered (HgbA1c, BMP8+EGFR, potassium, lamotrigine).   Consult Visits: 12/21/19 Neurology telephone call: Dr. AqDelice Leschould like to do a 48-hour EEG to determine where seizures are coming from. Increase Keppra to 50060m tablets at bedtime. Continue Lamictal 150m47mD.   12/17/19 Cardiology OV w/ Dr. McLeAundra Dubinesented for CHF follow up. Occasional  atypical chest pain. Dyspnea comes and goes. Occasional dizzy episodes. Generally feels weak and fatigued. Not volume overloaded, NYHA class II symptoms. Continue Lasix 80mg28mly. Will have pt see GI for hematochezia. No role for selective pulmonary vasodilators. Continue Lasix 80mg 23my  12/03/19 Cardiology OV w/B. Simmons PAC: Follow up for chronic diastolic post hospitalization. Pt recently evaluated for syncope. Heart monitor showed no significant arrhythmia. Continue carvedilol 25mg B47mContinue Imdur 30mg da3m Increase hydralazine to 75mg twi59maily or three times daily  (note states both directions).   11/09/19 CardioMEMS placed  11/08/19-11/11/19 ED admission: Presented with syncope. Hospital course complicated by seizure. Cardiology placed CardioMEMS and thinks her symptoms are secondary to seizures, but seizure evaluation in hospital was negative (EEG, CT head, MRI brain). Neurology recommended increasing Keppra to 500mg twic32mily and continue Lamictal. Lantus decreased to 5 units at bedtime due to hypoglycemic episode in hospital. Follow up with PCP in 1-2 weeks. Obtain BMP/CBC in 1 week. Follow up with Cardiology and Neurology.  09/15/19 Neurology telephone call: Pt called and reported possible side effect from Keppra (light-headed and no energy). Instructed pt to decrease Keppra to 500mg at be1me.   09/11/19 Neurology OV w/ Dr. Aquino: PreDelice Leschor follow up regarding seizures. Start Keppra 500mg BID. C63mnue Lamictal 150mg BID. Fo71m up in 6 months.   09/08/19 Cardiology OV w/ Dr. McLean: PreseAundra Dubinor follow up of CHF. SOB walking around house. 3 pillow orthopnea chronically. NYHA III, not volume overloaded. Continue Lasix 80mg daily. D59mt think metolazone needed. Arrange PFTs. Follow up in 3 months.   08/01/19 ED visit: Presented for chest pain and seizures. Started Keppra 500mg twice dai14m  Follow up with primary neurologist.   07/23/19 Cardiology OV w/ Dr. [name]: Continue  current medications. Lab work done. Will schedule pt for chest Xray and VQ scan. PT will need pulmonary function testing once they begin doing outpatient. Referred for CardioMEMS device.   07/10/19 Neurology Telephone call: Left message instructing pt to increase Lamictal to '150mg'$  BID (Previous dose '125mg'$  BID, but pt reported seizures).   06/24/19 Neurology OV w/ A. Lomax: Presented for 7 month follow up of OSA on CPAP. Pt is 80-90% compliant with CPAP use. Finds it beneficial and denied any issues. Follow up with Dr. Elsworth Soho (Pulmonology) as directed for CPAP management and hypoventilation syndrome.   05/28/19 Pulmonology OV w/ Dr. Elsworth Soho: Continue Lasix 80 mg daily and '40mg'$  on the weekends. Breathing improved with weight loss. CPAP setting working well. Pt is fairly compliant with CPAP, encouraged 4-6 hrs nightly use.   CCM Encounters: 11/19/19 SW: Referred to PharmD to address adherence issues.   09/07/19 RN: Evaluated current DM treatment plan. Collaborated with PCP regarding Humalog refill needed. Determined pt received Keppra via mail but has not started taking, wants to see Neurology first.   09/02/19 SW: Determined pt has not filled [place] yet, states pharmacy does not have on file. Discussed BP. PharmD: Refill for Keppra sent to [facility] mail order today for 30 day supply.   08/07/19 SW: Follow up with pt post-ED visit. Discussed new medication and plan to follow up with neurology.   07/06/19 PharmD: Comprehensive medication review performed. PT reported seizure activity for past 2-3 weeks. Advised caregiver (sister) to call Dr. Delice Lesch regarding this matter ASAP. Notified Dr. Baird Cancer.   Medications: Outpatient Encounter Medications as of 12/21/2019  Medication Sig  . Accu-Chek Softclix Lancets lancets Use as instructed to check blood sugars 2 times per day dx: e11.22  . acetaminophen (TYLENOL) 500 MG tablet Take 500-1,000 mg by mouth every 4 (four) hours as needed for moderate pain.   Marland Kitchen albuterol  (VENTOLIN HFA) 108 (90 Base) MCG/ACT inhaler Inhale 1-2 puffs into the lungs every 6 (six) hours as needed for wheezing or shortness of breath.  Marland Kitchen aspirin 81 MG chewable tablet Chew 1 tablet (81 mg total) by mouth daily.  Marland Kitchen atorvastatin (LIPITOR) 10 MG tablet Take 1 tablet (10 mg total) by mouth every evening.  . B Complex-C (B-COMPLEX WITH VITAMIN C) tablet Take 1 tablet by mouth daily.  . Blood Glucose Calibration (ACCU-CHEK AVIVA) SOLN Use as directed dx: e11.22  . Blood Glucose Monitoring Suppl (ACCU-CHEK AVIVA PLUS) w/Device KIT Use as directed to check blood sugars 2 times per day dx: e11.22  . carvedilol (COREG) 25 MG tablet TAKE 1 TABLET BY MOUTH TWICE DAILY WITH MEALS  . Cholecalciferol (VITAMIN D-3) 1000 units CAPS Take 1,000 Units by mouth daily.  . diclofenac sodium (VOLTAREN) 1 % GEL Apply 2 g topically 4 (four) times daily as needed (pain).  . furosemide (LASIX) 80 MG tablet Take 80 mg by mouth daily.   Marland Kitchen glucose blood (ACCU-CHEK AVIVA PLUS) test strip Use as instructed to check blood sugars 2 times per day dx: e11.22  . isosorbide mononitrate (IMDUR) 30 MG 24 hr tablet Take 1 tablet (30 mg total) by mouth daily.  . B-D ULTRAFINE III SHORT PEN 31G X 8 MM MISC USE DAILY WITH VICTOZA AND/OR NOVOLOG.  Marland Kitchen dexlansoprazole (DEXILANT) 60 MG capsule Take 1 capsule (60 mg total) by mouth daily.  . hydrALAZINE (APRESOLINE) 25 MG tablet Take 3 tablets (75 mg total) by  mouth 3 (three) times daily.  . hydrocortisone cream 1 % Apply 1 application topically daily as needed for itching.  . insulin lispro (HUMALOG KWIKPEN) 100 UNIT/ML KwikPen Inject 0.02-0.04 mLs (2-4 Units total) into the skin 3 (three) times daily as needed (high blood sugar over 150).  . Isopropyl Alcohol (ALCOHOL WIPES) 70 % MISC Apply 1 each topically 2 (two) times daily.  Marland Kitchen lamoTRIgine (LAMICTAL) 150 MG tablet Take 1 tablet (150 mg total) by mouth 2 (two) times daily.  Marland Kitchen levETIRAcetam (KEPPRA) 500 MG tablet 2 tablets at bedtime    . Menthol, Topical Analgesic, (STOPAIN ROLL-ON EX) Apply 1 application topically daily as needed (pain).  . Multiple Vitamins-Minerals (MULTIVITAMIN WITH MINERALS) tablet Take 1 tablet by mouth daily.  Marland Kitchen neomycin-bacitracin-polymyxin (NEOSPORIN) ointment Apply 1 application topically as needed for wound care.   Marland Kitchen OZEMPIC, 1 MG/DOSE, 2 MG/1.5ML SOPN INJECT '1MG'$  INTO THE SKIN ONCE A WEEK (Patient taking differently: Inject 1 mg into the muscle once a week. Thursday)  . Pancrelipase, Lip-Prot-Amyl, (ZENPEP) 25000-79000 units CPEP Take 1-2 capsules by mouth See admin instructions. Take 2 capsules in the morning, 1 capsule with lunch, and 2 capsules in the evening.  . polyethylene glycol (MIRALAX / GLYCOLAX) 17 g packet Take 17 g by mouth daily as needed for moderate constipation or severe constipation.  . potassium chloride SA (KLOR-CON) 20 MEQ tablet TAKE 2 TABLETS EVERY DAY BUT WHEN TAKING METOLAZONE TAKE 3 TABLETS DAILY AS DIRECTED  . sertraline (ZOLOFT) 50 MG tablet Take 1 tablet (50 mg total) by mouth daily.  Marland Kitchen tolnaftate (TINACTIN) 1 % cream Apply 1 application topically daily as needed (foot fungus).   [name] SOLOSTAR 300 UNIT/ML Solostar Pen INJECT 36 UNITS INTO THE SKIN AT BEDTIME. (Patient taking differently: 34 Units. )  . [DISCONTINUED] levETIRAcetam (KEPPRA) 500 MG tablet Take 1 tablet (500 mg total) by mouth 2 (two) times daily. (Patient taking differently: Take 500 mg by mouth at bedtime. )   No facility-administered encounter medications on file as of 12/21/2019.    Current Diagnosis/Assessment:  SDOH Interventions     Most Recent Value  SDOH Interventions  Financial Strain Interventions Intervention Not Indicated     Goals Addressed            This Visit's Progress   . Pharmacy Care Plan       CARE PLAN ENTRY (see longitudinal plan of care for additional care plan information)  Current Barriers:  . Chronic Disease Management support, education, and care coordination  needs related to Hypertension, Hyperlipidemia, Diabetes, and Heart Failure   Hypertension BP Readings from Last 3 Encounters:  12/21/19 116/70  12/17/19 140/65  12/03/19 132/74   . Pharmacist Clinical Goal(s): o Over the next 90 days, patient will work with PharmD and providers to maintain BP goal <130/80 . Current regimen:  o Carvedilol '25mg'$  twice daily o Furosemide 80 mg daily  o Hydralazine '25mg'$  3 tablets twice daily . Interventions: o Clarify dosing of hydralazine with cardiologist and confirm furosemide . Patient self care activities - Over the next 90 days, patient will: o Check BP daily, document, and provide at future appointments o Ensure daily salt intake < 2300 mg/day  Hyperlipidemia Lab Results  Component Value Date/Time   LDLCALC 65 12/17/2019 11:46 AM   LDLCALC 65 10/22/2018 02:46 PM   LDLDIRECT 66.0 01/27/2015 11:04 AM   . Pharmacist Clinical Goal(s): o Over the next 90 days, patient will work with PharmD and providers to maintain LDL  goal < 70 . Current regimen:  o Atorvastatin '10mg'$  daily . Interventions: o Dietary recommendations provided . Patient self care activities - Over the next 90 days, patient will: o Take cholesterol medication daily as directed  Diabetes Lab Results  Component Value Date/Time   HGBA1C 7.6 (H) 11/09/2019 12:21 AM   HGBA1C 8.0 (H) 08/24/2019 03:30 PM   . Pharmacist Clinical Goal(s): o Over the next 90 days, patient will work with PharmD and providers to achieve A1c goal <7% . Current regimen:  o Humalog Kwikpen 2-4 units three times daily as needed o Ozempic '1mg'$  weekly o Toujeo 300 units/mL 34 units at bedtime . Interventions: o Dietary recommendations provided o Discussed hypoglycemia and appropriate treatment . Patient self care activities - Over the next 90 days, patient will: o Check blood sugar 3-4 times daily, document, and provide at future appointments o Contact provider with any episodes of  hypoglycemia  Medication management . Pharmacist Clinical Goal(s): o Over the next 90 days, patient will work with PharmD and providers to maintain optimal medication adherence . Current pharmacy: [facility] . Interventions o Comprehensive medication review performed. o Medication list updated o Continue current medication management strategy o Clarified dosing of Keppra and Lamictal per Neurology telephone - Keppra '500mg'$  changed to 2 tablets nightly today - Continue Lamictal '150mg'$  twice daily o Determined patient taking furosemide '80mg'$  daily (not Monday through Friday as noted in medication list). Cardiology supports daily dosing.  o Clarified hydralazine dosing with Cardiology . Patient self care activities - Over the next 90 days, patient will: o Focus on medication adherence by using pill box o Take medications as prescribed o Report any questions or concerns to PharmD and/or provider(s)  Please see past updates related to this goal by clicking on the "Past Updates" button in the selected goal        Diabetes   Recent Relevant Labs: Lab Results  Component Value Date/Time   HGBA1C 7.6 (H) 11/09/2019 12:21 AM   HGBA1C 8.0 (H) 08/24/2019 03:30 PM   MICROALBUR 10 01/06/2020 12:16 PM   MICROALBUR 10 10/22/2018 12:45 PM    Checking BG: 3x per Day at least and more if too low or too high  Recent FBG Readings: 122 Recent pre-meal BG readings: 80 Recent 2hr PP BG readings:   Recent HS BG readings:  Patient has failed these meds in past: Jardiance, Invokana, Lantus, Levemir, Victoza, Metformin Patient is currently uncontrolled on the following medications:   Humalog Kwikpen 2-4 units three times daily as needed  Ozempic '1mg'$  weekly  Toujeo 300 units/mL 34 units at bedtime  Last diabetic Foot exam: Not on file Last diabetic Eye exam:  Lab Results  Component Value Date/Time   HMDIABEYEEXA No Retinopathy 04/15/2019 12:00 AM   We discussed:  Has not used Humalog  since she was discharged from hospital in May  Pt states insulin is free  Hypoglycemia and appropriate treatment. Pt states she know when her blood sugar gets low (feels like she is going to pass out).   Pt has an appointment with podiatry next week  Plan -Continue current medications   Heart Failure   Type: Diastolic  Last ejection fraction: 60-65% on 11/09/19 NYHA Class: II (slight limitation of activity)  Patient has failed these meds in past: Isosorbide dinitrate, metolazone, Bystolic, torsemide Patient is currently controlled on the following medications:   Carvedilol '25mg'$  twice daily  Furosemide 80 mg daily   Hydralazine '25mg'$  2 tablets twice daily  Isosorbide mononitrate '30mg'$  2 tablets daily  Potassium 23mq 2 tablets daily  We discussed  weighing daily; if you gain more than 3 pounds in one day or 5 pounds in one week call your doctor   Pt states she takes hydralazine 75 mg BID, but medication record states '75mg'$  TID. Cardiology note from 6/10 states both BID and TID.   Pt is not taking metolazone because weight is down  Pt reports she was only taking 1/2 tablet of Imdur '30mg'$  daily.   Advised pt to start taking '30mg'$  daily tomorrow as prescribed  Pt reports taking furosemide '80mg'$  every day (not Monday-Friday)  Plan -Continue current medications  -Contacted Cardiology regarding hydralazine and furosemide dosing.   Hypertension   Office blood pressures are  BP Readings from Last 3 Encounters:  01/07/20 111/64  01/06/20 136/64  12/21/19 116/70   Patient has failed these meds in the past: Valsartan, losartan Patient is currently controlled on the following medications: See HF  Patient checks BP at home daily  Patient home BP readings are ranging: SBP sometimes greater than 150 in the morning. SBP in the afternoon usually better (112-120).   We discussed:  BP goal is <130/80  Pt does not add extra salt to food  Plan Continue current medications    Hyperlipidemia   Lipid Panel     Component Value Date/Time   CHOL 150 12/17/2019 1146   CHOL 145 10/22/2018 1446   TRIG 47 12/17/2019 1146   HDL 76 12/17/2019 1146   HDL 70 10/22/2018 1446   LDLCALC 65 12/17/2019 1146   LDLCALC 65 10/22/2018 1446   LDLDIRECT 66.0 01/27/2015 1104    The 10-year ASCVD risk score (Mikey BussingDC Jr., et al., 2013) is: 18%   Values used to calculate the score:     Age: [age]years     Sex: Female     Is Non-Hispanic African American: Yes     Diabetic: Yes     Tobacco smoker: No     Systolic Blood Pressure: 1952mmHg     Is BP treated: Yes     HDL Cholesterol: 76 mg/dL     Total Cholesterol: 150 mg/dL   Patient has failed these meds in past: N/A Patient is currently controlled on the following medications:   Atorvastatin '10mg'$  daily  We discussed:   Plan Continue current medications   Seizure Disorder   Patient has failed these meds in past: N/A Patient is currently uncontrolled on the following medications:   Lamotrigine '150mg'$  twice daily  Levetiracetam '500mg'$  2 tablets at bedtime (increased 6/28)  We discussed:   Pt still reporting seizures, had 1 this morning  Pt contacted neurology today regarding ongoing seizures. Keppra increased to '500mg'$  2 tablets at bedtime  Clarified dosing of Keppra and Lamictal with pt and sister based on Neurology note from today  Plan Continue current medications   Miscellaneous   We discussed:   -Irritation in skin folds from sweat has improved with antifungal powder -Irritation/itching on buttocks has resolved  Plan No further intervention needed  Vaccines   Reviewed and discussed patient's vaccination history.    Immunization History  Administered Date(s) Administered  . Influenza Split 04/11/2011, 06/30/2013  . Influenza, High Dose Seasonal PF 02/14/2017, 04/24/2018  . Influenza-Unspecified 04/07/2014  . PFIZER SARS-COV-2 Vaccination 08/06/2019, 08/31/2019  . Tdap 05/18/2012  . Zoster  04/14/2014  Prevnar13 received 03/16/15 Pneumovax23 received 01/18/16  Shingrix vaccine discussed in detail (efficacy, side effects, schedule)  Plan  No further pneumonia shots needed at this time  Recommend pt receive Shingrix vaccine at local pharmacy  Medication Management   Pt uses [facility] for all medications Uses pill box? Yes Pt endorses 100% compliance  We discussed:   Importance of taking medications daily as directed  Plan Continue current medication management strategy   Follow up: 6 week phone visit  Jannette Fogo, PharmD Clinical Pharmacist [facility] [phone]

## 2019-12-21 NOTE — Telephone Encounter (Signed)
Patient's sister [name] called in wanting to speak with someone regarding her sister. She has been having seizures more frequently and would like to find out what they can do?

## 2019-12-21 NOTE — Patient Instructions (Signed)
Visit Information  Goals Addressed            This Visit's Progress   . Pharmacy Care Plan       CARE PLAN ENTRY (see longitudinal plan of care for additional care plan information)  Current Barriers:  . Chronic Disease Management support, education, and care coordination needs related to Hypertension, Hyperlipidemia, Diabetes, and Heart Failure   Hypertension BP Readings from Last 3 Encounters:  12/21/19 116/70  12/17/19 140/65  12/03/19 132/74   . Pharmacist Clinical Goal(s): o Over the next 90 days, patient will work with PharmD and providers to achieve BP goal <130/80 . Current regimen:  o Carvedilol 25mg  twice daily o Furosemide 80 mg daily Monday through Friday o Hydralazine 25mg  2 tablets twice daily . Interventions: o Clarify dosing of hydralazine with cardiologist . Patient self care activities - Over the next 90 days, patient will: o Check BP daily, document, and provide at future appointments o Ensure daily salt intake < 2300 mg/day  Hyperlipidemia Lab Results  Component Value Date/Time   LDLCALC 65 12/17/2019 11:46 AM   LDLCALC 65 10/22/2018 02:46 PM   LDLDIRECT 66.0 01/27/2015 11:04 AM   . Pharmacist Clinical Goal(s): o Over the next 90 days, patient will work with PharmD and providers to maintain LDL goal < 70 . Current regimen:  o Atorvastatin 10mg  daily . Interventions: o Dietary recommendations provided . Patient self care activities - Over the next 90 days, patient will: o Take cholesterol medication daily as directed  Diabetes Lab Results  Component Value Date/Time   HGBA1C 7.6 (H) 11/09/2019 12:21 AM   HGBA1C 8.0 (H) 08/24/2019 03:30 PM   . Pharmacist Clinical Goal(s): o Over the next 90 days, patient will work with PharmD and providers to achieve A1c goal <7% . Current regimen:  o Humalog Kwikpen 2-4 units three times daily as needed o Ozempic 1mg  weekly o Toujeo 300 units/mL 34 units at bedtime . Interventions: o Dietary  recommendations provided o Provided patient education about HgbA1c . Patient self care activities - Over the next 90 days, patient will: o Check blood sugar 3-4 times daily, document, and provide at future appointments o Contact provider with any episodes of hypoglycemia Miscellaneous . Interventions: o Recommended over the counter antifungal powder for sweat irritation in folds of skin, patient had been using neosporin o Recommended hydrocortisone for irritation/itching on buttocks . Patient self care activities - Over the next 30 days, patient will: o Try powder for sweat irritation in folds of skin o Apply hydrocortisone as needed for irritation/itching on buttocks  Medication management . Pharmacist Clinical Goal(s): o Over the next 90 days, patient will work with PharmD and providers to maintain optimal medication adherence . Current pharmacy: [facility] . Interventions o Comprehensive medication review performed. o Continue current medication management strategy . Patient self care activities - Over the next 90 days, patient will: o Focus on medication adherence by using pill box o Take medications as prescribed o Report any questions or concerns to PharmD and/or provider(s)  Initial goal documentation        [name] was given information about Chronic Care Management services today including:  1. CCM service includes personalized support from designated clinical staff supervised by her physician, including individualized plan of care and coordination with other care providers 2. 24/7 contact phone numbers for assistance for urgent and routine care needs. 3. Standard insurance, coinsurance, copays and deductibles apply for chronic care management only during months  in which we provide at least 20 minutes of these services. Most insurances cover these services at 100%, however patients may be responsible for any copay, coinsurance and/or deductible if applicable.  This service may help you avoid the need for more expensive face-to-face services. 4. Only one practitioner may furnish and bill the service in a calendar month. 5. The patient may stop CCM services at any time (effective at the end of the month) by phone call to the office staff.  Patient agreed to services and verbal consent obtained.   Patient verbalizes understanding of instructions provided today.  Face to Face appointment with pharmacist scheduled for: 12/21/19 @ 12:30PM  Jannette Fogo, PharmD Clinical Pharmacist [facility] [phone]

## 2019-12-21 NOTE — Chronic Care Management (AMB) (Signed)
  Chronic Care Management   Outreach Note  12/21/2019 Name: [name] MRN: [id number] DOB: 1945/10/03  Referred by: [name], MD Reason for referral : Care Coordination   SW placed unsuccessful outbound call to the patient to assist with ongoing care management and care coordination needs. SW left a HIPAA compliant voice message requesting a return call.  Follow Up Plan: No SW follow up planned at this time. Next PharmD outreach planned for 6/28. RN Care Manager outreach planned for 7/16. SW will remain available to assist with future care coordination needs.  Daneen Schick, BSW, CDP Social Worker, Certified Dementia Practitioner [place] [facility] [place] [facility] [phone]

## 2019-12-21 NOTE — Telephone Encounter (Signed)
Pt c/o: seizure Missed medications?  pts sister unsure if pt taking all of her medications because pt manages her own meds but she thinks she's taking them because she has a pill box she fills every week Sleep deprived?  No. Alcohol intake?  No. Back to their usual baseline self?  Yes.  .  Current medications prescribed by Dr. Delice Lesch: Lamotrigine 150mg  BID  Sister states pt had 5 min seizure this AM and probably 3 last week, has had at least 1 seizure/week she thinks since D/C from hospital around 5/18. Sister is concerned that medication is not controlling her seizures well enough. [name] [phone])

## 2019-12-21 NOTE — Telephone Encounter (Signed)
Called pts sister, [name], and inst her that pt should increase dose of Keppra to 500mg  2 tabs at HS, new script sent in to pharmacy, she verbalized understanding. States Manuela Schwartz has called her already to schedule 48 hr EEG.

## 2019-12-21 NOTE — Telephone Encounter (Signed)
Spoke with courtney, on 12-03-19 in the office note the hydralazine was increased to BID but the list has 75 mg TID. They are wondering what the patient needs to be taking. Will forward to the heart failure clinic to review and advise.

## 2019-12-21 NOTE — Patient Instructions (Addendum)
Take Dexilant upon awakening, Zantac at night ONLY   Epilepsy Epilepsy is when a person keeps having seizures. A seizure is a burst of abnormal activity in the brain. A seizure can change how you think or behave, and it can make it hard to be aware of what is happening. This condition can cause problems such as:  Falls, accidents, and injury.  Sadness (depression).  Poor memory.  Sudden unexplained death in epilepsy (SUDEP). This is rare. Its cause is not known. Most people with epilepsy lead normal lives. What are the causes? This condition may be caused by:  A head injury.  An injury that happens at birth.  A high fever during childhood.  A stroke.  Bleeding that goes into or around the brain.  Certain medicines and drugs.  Having too little oxygen for a long period of time.  Abnormal brain development.  Certain infections.  Brain tumors.  Conditions that are passed from parent to child (are hereditary). What are the signs or symptoms? Symptoms of a seizure vary from person to person. They may include:  Jerky movements of muscles (convulsions).  Stiffening of the body.  Movements of the arms or legs that you are not able to control.  Passing out (loss of consciousness).  Breathing problems.  Sudden falls.  Confusion.  Head nodding.  Eye blinking or twitching.  Lip smacking.  Drooling.  Fast eye movements.  Grunting.  Not being able to control when you pee or poop.  Staring.  Being hard to wake up (unresponsiveness). Some people have symptoms right before a seizure happens (aura) and right after a seizure happens. These symptoms include:  Fear or anxiety.  Feeling sick to your stomach (nauseous).  Feeling like the room is spinning (vertigo).  A feeling of having seen or heard something before (dj vu).  Odd tastes or smells.  Changes in how you see (vision), such as seeing flashing lights or spots. Symptoms that follow a seizure  include:  Being confused.  Being sleepy.  Having a headache. How is this treated? Treatment can control seizures. Treatment for this condition may involve:  Taking medicines to control seizures.  Having a device (vagus nerve stimulator) put in the chest. The device sends signals to a nerve and to the brain to prevent seizures.  Brain surgery to stop seizures from happening or to reduce how often they happen.  Having blood tests often to make sure you are getting the right amount of medicine. Once this condition has been diagnosed, it is important to start treatment as soon as possible. For some people, epilepsy goes away in time. Others will need treatment for the rest of their life. Follow these instructions at home: Medicines  Take over-the-counter and prescription medicines only as told by your doctor.  Avoid anything that may keep your medicine from working, such as alcohol. Activity  Get enough rest. Lack of sleep can make seizures more likely to occur.  Follow your doctor's advice about driving, swimming, and doing anything else that would be dangerous if you had a seizure. ? If you live in the [place] check with your local DMV (department of motor vehicles) to find out about local driving laws. Each state has rules about when you can return to driving. Teaching others Teach friends and family what to do if you have a seizure. They should:  Lay you on the ground to prevent a fall.  Cushion your head and body.  Loosen any tight clothing around your  neck.  Turn you on your side.  Stay with you until you are better.  Not hold you down.  Not put anything in your mouth.  Know whether or not you need emergency care.  General instructions  Avoid anything that causes you to have seizures.  Keep a seizure diary. Write down what you remember about each seizure. Be sure to include what might have caused it.  Keep all follow-up visits as told by your doctor. This is  important. Contact a doctor if:  You have a change in how often or when you have seizures.  You get an infection or start to feel sick. You may have more seizures when you are sick. Get help right away if:  A seizure does not stop after 5 minutes.  You have more than one seizure in a row, and you do not have enough time between the seizures to feel better.  A seizure makes it harder to breathe.  A seizure is different from other seizures you have had.  A seizure makes you unable to speak or use a part of your body.  You did not wake up right after a seizure. These symptoms may be an emergency. Do not wait to see if the symptoms will go away. Get medical help right away. Call your local emergency services (911 in the [place] Do not drive yourself to the hospital. Summary  Epilepsy is when a person keeps having seizures. A seizure is a burst of abnormal activity in the brain.  Treatment can control seizures.  Teach friends and family what to do if you have a seizure. This information is not intended to replace advice given to you by your health care provider. Make sure you discuss any questions you have with your health care provider. Document Revised: 02/03/2018 Document Reviewed: 02/03/2018 Elsevier Patient Education  2020 [employer].

## 2019-12-21 NOTE — Patient Instructions (Addendum)
Visit Information  Goals Addressed            This Visit's Progress   . Pharmacy Care Plan       CARE PLAN ENTRY (see longitudinal plan of care for additional care plan information)  Current Barriers:  . Chronic Disease Management support, education, and care coordination needs related to Hypertension, Hyperlipidemia, Diabetes, and Heart Failure   Hypertension BP Readings from Last 3 Encounters:  12/21/19 116/70  12/17/19 140/65  12/03/19 132/74   . Pharmacist Clinical Goal(s): o Over the next 90 days, patient will work with PharmD and providers to achieve BP goal <130/80 . Current regimen:  o Carvedilol 25mg  twice daily o Furosemide 80 mg daily  o Hydralazine 25mg  3 tablets twice daily . Interventions: o Clarify dosing of hydralazine with cardiologist . Patient self care activities - Over the next 90 days, patient will: o Check BP daily, document, and provide at future appointments o Ensure daily salt intake < 2300 mg/day  Hyperlipidemia Lab Results  Component Value Date/Time   LDLCALC 65 12/17/2019 11:46 AM   LDLCALC 65 10/22/2018 02:46 PM   LDLDIRECT 66.0 01/27/2015 11:04 AM   . Pharmacist Clinical Goal(s): o Over the next 90 days, patient will work with PharmD and providers to maintain LDL goal < 70 . Current regimen:  o Atorvastatin 10mg  daily . Interventions: o Dietary recommendations provided . Patient self care activities - Over the next 90 days, patient will: o Take cholesterol medication daily as directed  Diabetes Lab Results  Component Value Date/Time   HGBA1C 7.6 (H) 11/09/2019 12:21 AM   HGBA1C 8.0 (H) 08/24/2019 03:30 PM   . Pharmacist Clinical Goal(s): o Over the next 90 days, patient will work with PharmD and providers to achieve A1c goal <7% . Current regimen:  o Humalog Kwikpen 2-4 units three times daily as needed o Ozempic 1mg  weekly o Toujeo 300 units/mL 34 units at bedtime . Interventions: o Dietary recommendations  provided o Provided patient education about HgbA1c . Patient self care activities - Over the next 90 days, patient will: o Check blood sugar 3-4 times daily, document, and provide at future appointments o Contact provider with any episodes of hypoglycemia  Medication management . Pharmacist Clinical Goal(s): o Over the next 90 days, patient will work with PharmD and providers to maintain optimal medication adherence . Current pharmacy: [facility] . Interventions o Comprehensive medication review performed. o Medication list updated o Continue current medication management strategy o Clarified dosing of Keppra and Lamictal per Neurology telephone - Keppra 500mg  changed to 2 tablets nightly today - Continue Lamictal 150mg  twice daily o Determined patient taking furosemide 80mg  daily (not Monday through Friday as noted in medication list). Cardiology supports daily dosing.  o Clarified hydralazine dosing with Cardiology . Patient self care activities - Over the next 90 days, patient will: o Focus on medication adherence by using pill box o Take medications as prescribed o Report any questions or concerns to PharmD and/or provider(s)  Please see past updates related to this goal by clicking on the "Past Updates" button in the selected goal         The patient verbalized understanding of instructions provided today and agreed to receive a mailed copy of patient instruction and/or educational materials.  Telephone follow up appointment with pharmacy team member scheduled for: 02/01/20 @ 3:30 PM  Jannette Fogo, PharmD Clinical Pharmacist [facility] [phone]   Zoster Vaccine, Recombinant injection What is this  medicine? ZOSTER VACCINE (ZOS ter vak SEEN) is used to prevent shingles in adults 72 years old and over. This vaccine is not used to treat shingles or nerve pain from shingles. This medicine may be used for other purposes; ask your health  care provider or pharmacist if you have questions. COMMON BRAND NAME(S): [name] What should I tell my health care provider before I take this medicine? They need to know if you have any of these conditions:  blood disorders or disease  cancer like leukemia or lymphoma  immune system problems or therapy  an unusual or allergic reaction to vaccines, other medications, foods, dyes, or preservatives  pregnant or trying to get pregnant  breast-feeding How should I use this medicine? This vaccine is for injection in a muscle. It is given by a health care professional. Talk to your pediatrician regarding the use of this medicine in children. This medicine is not approved for use in children. Overdosage: If you think you have taken too much of this medicine contact a poison control center or emergency room at once. NOTE: This medicine is only for you. Do not share this medicine with others. What if I miss a dose? Keep appointments for follow-up (booster) doses as directed. It is important not to miss your dose. Call your doctor or health care professional if you are unable to keep an appointment. What may interact with this medicine?  medicines that suppress your immune system  medicines to treat cancer  steroid medicines like prednisone or cortisone This list may not describe all possible interactions. Give your health care provider a list of all the medicines, herbs, non-prescription drugs, or dietary supplements you use. Also tell them if you smoke, drink alcohol, or use illegal drugs. Some items may interact with your medicine. What should I watch for while using this medicine? Visit your doctor for regular check ups. This vaccine, like all vaccines, may not fully protect everyone. What side effects may I notice from receiving this medicine? Side effects that you should report to your doctor or health care professional as soon as possible:  allergic reactions like skin rash,  itching or hives, swelling of the face, lips, or tongue  breathing problems Side effects that usually do not require medical attention (report these to your doctor or health care professional if they continue or are bothersome):  chills  headache  fever  nausea, vomiting  redness, warmth, pain, swelling or itching at site where injected  tiredness This list may not describe all possible side effects. Call your doctor for medical advice about side effects. You may report side effects to FDA at 1-800-FDA-1088. Where should I keep my medicine? This vaccine is only given in a clinic, pharmacy, doctor's office, or other health care setting and will not be stored at home. NOTE: This sheet is a summary. It may not cover all possible information. If you have questions about this medicine, talk to your doctor, pharmacist, or health care provider.  2020 Elsevier/Gold Standard (2017-01-21 13:20:30)

## 2019-12-21 NOTE — Telephone Encounter (Signed)
Pls let sister know that I would like to do a longer EEG to see where the seizures are coming from. She will come in to the office and the EEG will be connected, then she will go home with the EEG on her head for 2 days. Whenever she has a seizure, there is a push button for them to press so it marks it on the study. She will then come back after 2 days so we can take the EEG off and review the data.  Also, is she taking the new medication Keppra (Levetiracetam) 500mg  with the Lamotrigine? If yes, how is she taking it, only at bedtime or twice a day? Thanks

## 2019-12-21 NOTE — Telephone Encounter (Signed)
Courtney from [facility] called to request direction on a few medicines.   She states that the patients medicine list states patient needs to be taking hydrALAZINE (APRESOLINE) 25 MG tablet - Take 3 tablets (75 mg total) by mouth 3 (three) times daily but that the 6/10 hospital visit note says hydrALAZINE (APRESOLINE) 25 MG tablet - Take 2 tablets (50 mg total) by mouth 2 (two) times daily. Loma Sousa is wanting to know how much it needs to be.   She also states that the patient states she is taking furosemide (LASIX) 80 MG tablet  but that the patient says she is taking it everyday. Medicine list says for patient to Take 80 mg by mouth. 1 tab Monday - Friday. Loma Sousa is requesting to know if patient needs to be taking it everyday?   Please call and advise.

## 2019-12-21 NOTE — Telephone Encounter (Signed)
Spoke with pt sister, she states pt taking Keppra 500mg  at HS.   Inst her that order will be placed for pt to get 48 hr EEG, pt will come into office to have test set up then will go home and return after 48 hrs for removal of EEG, they will have a button to push if pt has a witnessed seizure, verbalized understanding.  Hinton Dyer - can you please reach out to pt/sister to schedule

## 2019-12-22 NOTE — Addendum Note (Signed)
Encounter addended by: Scarlette Calico, RN on: 12/22/2019 1:11 PM  Actions taken: Order Reconciliation Section accessed

## 2019-12-22 NOTE — Telephone Encounter (Signed)
Upon review of chart:  11/12/19 Hosp d/c: Torsemide 40 mg Daily        Hydralazine 50 mg BID  11/17/19 pt saw PCP: Torsemide was taken off med list and Furosemide 80 mg Mon-Fri was put on med list, PCP note does not state there was a change  12/03/19 [facility]: med list shows the Lasix 80 mg Mon-Fri however [facility] note states to continue Torsemide, Hydralazine was increased back up to 75 mg TID  12/17/19 [facility] Clinic: med list still with Lasix 80 mg mon-fri, Dr Claris Gladden note states to continue Lasix 80 mg Daily  Discussed all above w/Amy Ninfa Meeker, NP she states pt's cardiomems reading is stable so whatever dose of diuretic pt has been taking should be continued.  I attempted to call pt to verify meds and Left message to call back, attempted to call pharmacy but they are currently closed for lunch will try back later

## 2019-12-22 NOTE — Progress Notes (Signed)
This visit occurred during the SARS-CoV-2 public health emergency.  Safety protocols were in place, including screening questions prior to the visit, additional usage of staff PPE, and extensive cleaning of exam room while observing appropriate contact time as indicated for disinfecting solutions.  Subjective:     Patient ID: [name] , female    DOB: [birth year] , 74 y.o.   MRN: [id number]   Chief Complaint  Patient presents with  . Diabetes  . Hypertension    HPI  She is here today for a diabetes check. She reports her blood sugars have improved. She also admits to moving more. She is pleased with her progress.   Diabetes She presents for her follow-up diabetic visit. She has type 2 diabetes mellitus. Her disease course has been improving. There are no hypoglycemic associated symptoms. Pertinent negatives for diabetes include no blurred vision and no chest pain. There are no hypoglycemic complications. Diabetic complications include nephropathy. Risk factors for coronary artery disease include diabetes mellitus, dyslipidemia, hypertension, obesity, post-menopausal and sedentary lifestyle. She is compliant with treatment some of the time. Her weight is fluctuating minimally. She is following a generally healthy diet. She never participates in exercise. Her home blood glucose trend is decreasing steadily. Her breakfast blood glucose is taken between 8-9 am. Her breakfast blood glucose range is generally 130-140 mg/dl. An ACE inhibitor/angiotensin II receptor blocker is being taken. Eye exam is not current.  Hypertension This is a chronic problem. The current episode started more than 1 year ago. The problem has been gradually improving since onset. The problem is controlled. Pertinent negatives include no blurred vision, chest pain, palpitations or shortness of breath. The current treatment provides moderate improvement. Hypertensive end-organ damage includes kidney disease. Identifiable  causes of hypertension include sleep apnea.     Past Medical History:  Diagnosis Date  . Abnormal liver function     in the past.  . Anemia   . Arthritis    all over  . Bruises easily   . Cataract    right eye;immature  . Chronic back pain    stenosis  . Chronic cough   . Chronic kidney disease   . COPD (chronic obstructive pulmonary disease) ([place])   . Demand myocardial infarction [facility]) 2012   Demand Infarction in setting of Pancreatitis --> mild Troponin elevation, NON-OBSTRUCTIVE CAD  . Depression    takes Abilify daily as well as Zoloft  . Diastolic heart failure 8841   Grade 1 diastolic Dysfunction by Echo   . Diverticulosis   . DM (diabetes mellitus) ([place])    takes Victoza daily as well as Lantus and Humalog  . Empty sella ([place])    on MRI in 2009.  . Glaucoma   . Headache(784.0)    last migraine-4-12yr ago  . History of blood transfusion    no abnormal reaction noted  . History of colon polyps    benign  . HTN (hypertension)    takes BK[employer] Morgan Energydaily  . Hyperlipidemia    takes Lipitor daily  . Joint swelling   . Nocturia   . Obesity hypoventilation syndrome ([place]   . Obstructive sleep apnea 02/2018   Notably improved split-night study with weight loss from 270 (2017) down to 250 pounds (2019).  . Pancreatitis    takes Pancrelipase daily  . Parkinson's disease ([place]    takes Sinemet daily  . Peripheral neuropathy   . Pneumonia 2012  . Seizures ([place]    takes Lamictal daily and  Primidone nightly;last seizure 2wks ago  . Urinary urgency    With increased frequency  . Varicose veins of both lower extremities with pain    With edema.  Takes daily Lasix     Family History  Problem Relation Age of Onset  . Allergies Father   . Heart disease Father        before 24  . Heart failure Father   . Hypertension Father   . Hyperlipidemia Father   . Heart disease Mother   . Diabetes Mother   . Hypertension Mother   . Hyperlipidemia Mother    . Hypertension Sister   . Heart disease Sister        before 67  . Hyperlipidemia Sister   . Diabetes Brother   . Hypertension Brother   . Diabetes Sister   . Hypertension Sister   . Diabetes Son   . Hypertension Son   . Cancer Other      Current Outpatient Medications:  .  Accu-Chek Softclix Lancets lancets, Use as instructed to check blood sugars 2 times per day dx: e11.22, Disp: 300 each, Rfl: 3 .  acetaminophen (TYLENOL) 500 MG tablet, Take 500-1,000 mg by mouth every 4 (four) hours as needed for moderate pain. , Disp: , Rfl:  .  albuterol (VENTOLIN HFA) 108 (90 Base) MCG/ACT inhaler, Inhale 1-2 puffs into the lungs every 6 (six) hours as needed for wheezing or shortness of breath., Disp: , Rfl:  .  aspirin 81 MG chewable tablet, Chew 1 tablet (81 mg total) by mouth daily., Disp:  , Rfl:  .  atorvastatin (LIPITOR) 10 MG tablet, Take 1 tablet (10 mg total) by mouth every evening., Disp: 90 tablet, Rfl: 2 .  B Complex-C (B-COMPLEX WITH VITAMIN C) tablet, Take 1 tablet by mouth daily., Disp: , Rfl:  .  B-D ULTRAFINE III SHORT PEN 31G X 8 MM MISC, USE DAILY WITH VICTOZA AND/OR NOVOLOG., Disp: 400 each, Rfl: 1 .  Blood Glucose Calibration (ACCU-CHEK AVIVA) SOLN, Use as directed dx: e11.22, Disp: 1 each, Rfl: 3 .  Blood Glucose Monitoring Suppl (ACCU-CHEK AVIVA PLUS) w/Device KIT, Use as directed to check blood sugars 2 times per day dx: e11.22, Disp: 1 kit, Rfl: 3 .  carvedilol (COREG) 25 MG tablet, TAKE 1 TABLET BY MOUTH TWICE DAILY WITH MEALS, Disp: 180 tablet, Rfl: 0 .  Cholecalciferol (VITAMIN D-3) 1000 units CAPS, Take 1,000 Units by mouth daily., Disp: , Rfl:  .  diclofenac sodium (VOLTAREN) 1 % GEL, Apply 2 g topically 4 (four) times daily as needed (pain)., Disp: , Rfl:  .  furosemide (LASIX) 80 MG tablet, Take 80 mg by mouth daily. , Disp: , Rfl:  .  glucose blood (ACCU-CHEK AVIVA PLUS) test strip, Use as instructed to check blood sugars 2 times per day dx: e11.22, Disp: 300  each, Rfl: 3 .  hydrALAZINE (APRESOLINE) 25 MG tablet, Take 3 tablets (75 mg total) by mouth 3 (three) times daily., Disp: 270 tablet, Rfl: 3 .  hydrocortisone cream 1 %, Apply 1 application topically daily as needed for itching., Disp: , Rfl:  .  insulin lispro (HUMALOG KWIKPEN) 100 UNIT/ML KwikPen, Inject 0.02-0.04 mLs (2-4 Units total) into the skin 3 (three) times daily as needed (high blood sugar over 150)., Disp: 15 mL, Rfl: 2 .  Isopropyl Alcohol (ALCOHOL WIPES) 70 % MISC, Apply 1 each topically 2 (two) times daily., Disp: 300 each, Rfl: 3 .  isosorbide mononitrate (IMDUR) 30 MG 24 hr  tablet, Take 1 tablet (30 mg total) by mouth daily. (Patient taking differently: Take 60 mg by mouth daily. ), Disp: 30 tablet, Rfl: 11 .  lamoTRIgine (LAMICTAL) 150 MG tablet, Take 1 tablet (150 mg total) by mouth 2 (two) times daily., Disp: 180 tablet, Rfl: 3 .  levETIRAcetam (KEPPRA) 500 MG tablet, 2 tablets at bedtime, Disp: 60 tablet, Rfl: 3 .  Menthol, Topical Analgesic, (STOPAIN ROLL-ON EX), Apply 1 application topically daily as needed (pain)., Disp: , Rfl:  .  Multiple Vitamins-Minerals (MULTIVITAMIN WITH MINERALS) tablet, Take 1 tablet by mouth daily., Disp: , Rfl:  .  OZEMPIC, 1 MG/DOSE, 2 MG/1.5ML SOPN, INJECT 1MG INTO THE SKIN ONCE A WEEK (Patient taking differently: Inject 1 mg into the muscle once a week. Thursday), Disp: 12 mL, Rfl: 0 .  Pancrelipase, Lip-Prot-Amyl, (ZENPEP) 25000-79000 units CPEP, Take 1-2 capsules by mouth See admin instructions. Take 2 capsules in the morning, 1 capsule with lunch, and 2 capsules in the evening., Disp: , Rfl:  .  polyethylene glycol (MIRALAX / GLYCOLAX) 17 g packet, Take 17 g by mouth daily as needed for moderate constipation or severe constipation., Disp: 14 each, Rfl: 0 .  potassium chloride SA (KLOR-CON) 20 MEQ tablet, TAKE 2 TABLETS EVERY DAY BUT WHEN TAKING METOLAZONE TAKE 3 TABLETS DAILY AS DIRECTED, Disp: 204 tablet, Rfl: 2 .  sertraline (ZOLOFT) 50 MG  tablet, Take 1 tablet (50 mg total) by mouth daily., Disp: 90 tablet, Rfl: 2 .  tolnaftate (TINACTIN) 1 % cream, Apply 1 application topically daily as needed (foot fungus). , Disp: , Rfl:  .  TOUJEO SOLOSTAR 300 UNIT/ML Solostar Pen, INJECT 36 UNITS INTO THE SKIN AT BEDTIME. (Patient taking differently: 34 Units. ), Disp: 9 pen, Rfl: 2 .  dexlansoprazole (DEXILANT) 60 MG capsule, Take 1 capsule (60 mg total) by mouth daily., Disp: 30 capsule, Rfl: 0 .  neomycin-bacitracin-polymyxin (NEOSPORIN) ointment, Apply 1 application topically as needed for wound care. (Patient not taking: Reported on 12/21/2019), Disp: , Rfl:    Allergies  Allergen Reactions  . Other Anaphylaxis and Rash    Bleach  . Penicillins Hives    Did it involve swelling of the face/tongue/throat, SOB, or low BP? No Did it involve sudden or severe rash/hives, skin peeling, or any reaction on the inside of your mouth or nose? Yes Did you need to seek medical attention at a hospital or doctor's office? Yes When did it last happen?Childhood allergy  If all above answers are "NO", may proceed with cephalosporin use.    . Sulfa Antibiotics Hives  . Aspirin Other (See Comments)     On aspirin 81 mg - Rectal bleeding in dec 2018  . Codeine     Headache and makes the patient feel "off"     Review of Systems  Constitutional: Negative.   Eyes: Negative for blurred vision.  Respiratory: Negative.  Negative for shortness of breath.   Cardiovascular: Negative.  Negative for chest pain and palpitations.  Gastrointestinal: Negative.   Neurological: Negative.   Psychiatric/Behavioral: Negative.      Today's Vitals   12/21/19 1148  BP: 116/70  Pulse: 63  Temp: 98.2 F (36.8 C)  TempSrc: Oral  Weight: 214 lb 12.8 oz (97.4 kg)  Height: 5' 1"  (1.549 m)  PainSc: 9   PainLoc: Foot   Body mass index is 40.59 kg/m.   Objective:  Physical Exam Vitals and nursing note reviewed.  Constitutional:      Appearance: Normal  appearance.  She is obese.  HENT:     Head: Normocephalic and atraumatic.  Cardiovascular:     Rate and Rhythm: Normal rate and regular rhythm.     Heart sounds: Murmur heard.   Pulmonary:     Effort: Pulmonary effort is normal.     Breath sounds: Normal breath sounds.  Skin:    General: Skin is warm.  Neurological:     General: No focal deficit present.     Mental Status: She is alert.  Psychiatric:        Mood and Affect: Mood normal.        Behavior: Behavior normal.         Assessment And Plan:     1. Type 2 diabetes mellitus with stage 2 chronic kidney disease, with long-term current use of insulin (HCC)  Chronic. She will continue with current meds. She is advised to avoid sugary beverages and to cut back on her intake of desserts.   2. Malignant hypertension, heart failure & chronic kidney dis stage II (HCC)  Chronic, well controlled.  She will continue with current meds.   3. Seizure disorder (HCC)  Chronic. Importance of medication compliance was discussed with the patient. I will check Keppra level today.   4. Gastroesophageal reflux disease without esophagitis  Chronic. She was given samples of Dexilant, 47m to take once daily. I am hoping her sx will improve with 30-60 days of therapy. She is also advised to stop eating 3 hours prior to lying down.   5. Drug therapy  - Levetiracetam level   RMaximino Greenland MD    THE PATIENT IS ENCOURAGED TO PRACTICE SOCIAL DISTANCING DUE TO THE COVID-19 PANDEMIC.

## 2019-12-23 DIAGNOSIS — I130 Hypertensive heart and chronic kidney disease with heart failure and stage 1 through stage 4 chronic kidney disease, or unspecified chronic kidney disease: Secondary | ICD-10-CM | POA: Diagnosis not present

## 2019-12-23 DIAGNOSIS — J9611 Chronic respiratory failure with hypoxia: Secondary | ICD-10-CM | POA: Diagnosis not present

## 2019-12-23 DIAGNOSIS — D631 Anemia in chronic kidney disease: Secondary | ICD-10-CM | POA: Diagnosis not present

## 2019-12-23 DIAGNOSIS — N182 Chronic kidney disease, stage 2 (mild): Secondary | ICD-10-CM | POA: Diagnosis not present

## 2019-12-23 DIAGNOSIS — E1122 Type 2 diabetes mellitus with diabetic chronic kidney disease: Secondary | ICD-10-CM | POA: Diagnosis not present

## 2019-12-23 DIAGNOSIS — J449 Chronic obstructive pulmonary disease, unspecified: Secondary | ICD-10-CM | POA: Diagnosis not present

## 2019-12-23 DIAGNOSIS — G40909 Epilepsy, unspecified, not intractable, without status epilepticus: Secondary | ICD-10-CM | POA: Diagnosis not present

## 2019-12-23 DIAGNOSIS — I5082 Biventricular heart failure: Secondary | ICD-10-CM | POA: Diagnosis not present

## 2019-12-23 DIAGNOSIS — I5032 Chronic diastolic (congestive) heart failure: Secondary | ICD-10-CM | POA: Diagnosis not present

## 2019-12-23 LAB — LEVETIRACETAM LEVEL: Levetiracetam Lvl: 6.5 ug/mL — ABNORMAL LOW (ref 10.0–40.0)

## 2019-12-24 DIAGNOSIS — G4733 Obstructive sleep apnea (adult) (pediatric): Secondary | ICD-10-CM | POA: Diagnosis not present

## 2019-12-30 ENCOUNTER — Other Ambulatory Visit: Payer: Self-pay

## 2019-12-30 ENCOUNTER — Ambulatory Visit (INDEPENDENT_AMBULATORY_CARE_PROVIDER_SITE_OTHER): Payer: Medicare HMO | Admitting: Neurology

## 2019-12-30 DIAGNOSIS — I5032 Chronic diastolic (congestive) heart failure: Secondary | ICD-10-CM | POA: Diagnosis not present

## 2019-12-30 DIAGNOSIS — D631 Anemia in chronic kidney disease: Secondary | ICD-10-CM | POA: Diagnosis not present

## 2019-12-30 DIAGNOSIS — J449 Chronic obstructive pulmonary disease, unspecified: Secondary | ICD-10-CM | POA: Diagnosis not present

## 2019-12-30 DIAGNOSIS — I5082 Biventricular heart failure: Secondary | ICD-10-CM | POA: Diagnosis not present

## 2019-12-30 DIAGNOSIS — N182 Chronic kidney disease, stage 2 (mild): Secondary | ICD-10-CM | POA: Diagnosis not present

## 2019-12-30 DIAGNOSIS — I130 Hypertensive heart and chronic kidney disease with heart failure and stage 1 through stage 4 chronic kidney disease, or unspecified chronic kidney disease: Secondary | ICD-10-CM | POA: Diagnosis not present

## 2019-12-30 DIAGNOSIS — E1122 Type 2 diabetes mellitus with diabetic chronic kidney disease: Secondary | ICD-10-CM | POA: Diagnosis not present

## 2019-12-30 DIAGNOSIS — J9611 Chronic respiratory failure with hypoxia: Secondary | ICD-10-CM | POA: Diagnosis not present

## 2019-12-30 DIAGNOSIS — G40909 Epilepsy, unspecified, not intractable, without status epilepticus: Secondary | ICD-10-CM | POA: Diagnosis not present

## 2019-12-30 DIAGNOSIS — G40009 Localization-related (focal) (partial) idiopathic epilepsy and epileptic syndromes with seizures of localized onset, not intractable, without status epilepticus: Secondary | ICD-10-CM

## 2020-01-01 ENCOUNTER — Telehealth (HOSPITAL_COMMUNITY): Payer: Self-pay

## 2020-01-01 DIAGNOSIS — K921 Melena: Secondary | ICD-10-CM

## 2020-01-01 NOTE — Telephone Encounter (Signed)
Orders Placed This Encounter  Procedures  . Ambulatory referral to Gastroenterology    Referral Priority:   Routine    Referral Type:   Consultation    Referral Reason:   Specialty Services Required    Referred to Provider:   Arta Silence, MD    Requested Specialty:   Gastroenterology    Number of Visits Requested:   1

## 2020-01-04 DIAGNOSIS — I5032 Chronic diastolic (congestive) heart failure: Secondary | ICD-10-CM | POA: Diagnosis not present

## 2020-01-04 DIAGNOSIS — G40909 Epilepsy, unspecified, not intractable, without status epilepticus: Secondary | ICD-10-CM | POA: Diagnosis not present

## 2020-01-04 DIAGNOSIS — D631 Anemia in chronic kidney disease: Secondary | ICD-10-CM | POA: Diagnosis not present

## 2020-01-04 DIAGNOSIS — J449 Chronic obstructive pulmonary disease, unspecified: Secondary | ICD-10-CM | POA: Diagnosis not present

## 2020-01-04 DIAGNOSIS — I130 Hypertensive heart and chronic kidney disease with heart failure and stage 1 through stage 4 chronic kidney disease, or unspecified chronic kidney disease: Secondary | ICD-10-CM | POA: Diagnosis not present

## 2020-01-04 DIAGNOSIS — E1122 Type 2 diabetes mellitus with diabetic chronic kidney disease: Secondary | ICD-10-CM | POA: Diagnosis not present

## 2020-01-04 DIAGNOSIS — I5082 Biventricular heart failure: Secondary | ICD-10-CM | POA: Diagnosis not present

## 2020-01-04 DIAGNOSIS — J9611 Chronic respiratory failure with hypoxia: Secondary | ICD-10-CM | POA: Diagnosis not present

## 2020-01-04 DIAGNOSIS — N182 Chronic kidney disease, stage 2 (mild): Secondary | ICD-10-CM | POA: Diagnosis not present

## 2020-01-05 ENCOUNTER — Ambulatory Visit: Payer: Medicare HMO | Admitting: Sports Medicine

## 2020-01-05 ENCOUNTER — Encounter: Payer: Self-pay | Admitting: Sports Medicine

## 2020-01-05 ENCOUNTER — Other Ambulatory Visit: Payer: Self-pay

## 2020-01-05 DIAGNOSIS — M779 Enthesopathy, unspecified: Secondary | ICD-10-CM

## 2020-01-05 DIAGNOSIS — M21069 Valgus deformity, not elsewhere classified, unspecified knee: Secondary | ICD-10-CM

## 2020-01-05 DIAGNOSIS — M79673 Pain in unspecified foot: Secondary | ICD-10-CM

## 2020-01-05 DIAGNOSIS — G8929 Other chronic pain: Secondary | ICD-10-CM | POA: Diagnosis not present

## 2020-01-05 DIAGNOSIS — N182 Chronic kidney disease, stage 2 (mild): Secondary | ICD-10-CM | POA: Insufficient documentation

## 2020-01-05 DIAGNOSIS — M069 Rheumatoid arthritis, unspecified: Secondary | ICD-10-CM

## 2020-01-05 DIAGNOSIS — I129 Hypertensive chronic kidney disease with stage 1 through stage 4 chronic kidney disease, or unspecified chronic kidney disease: Secondary | ICD-10-CM | POA: Insufficient documentation

## 2020-01-05 DIAGNOSIS — M2141 Flat foot [pes planus] (acquired), right foot: Secondary | ICD-10-CM

## 2020-01-05 DIAGNOSIS — M2142 Flat foot [pes planus] (acquired), left foot: Secondary | ICD-10-CM | POA: Diagnosis not present

## 2020-01-05 DIAGNOSIS — R0683 Snoring: Secondary | ICD-10-CM | POA: Insufficient documentation

## 2020-01-05 NOTE — Progress Notes (Signed)
Subjective: [name] is a 74 y.o. female patient who presents to office for evaluation of bilateral foot wound that has been going on for several years slowly getting worse especially over the last 3 to 5 years reports that she has previously seen foot doctors had custom braces "corrective shoes injections orthotics and brought several records from previous doctors without any improvement reports that she has had foot problems all of her life remember even as a kid having to crawl a little bit before she could stand up due to the pain in both feet and severe flatfoot problem reports that now her mobility is very limited has to use walker for the initial few hours before she feels comfortable enough to go to using a cane due to pain in both feet reports that there is pain that started on the medial aspect of both feet but now all the pain is at the lateral aspect of both feet or the outer sides sharp shooting in nature.  Patient also reports that she recently completed physical therapy for other issues on last week and there has not been any improvement with her feet reports that her therapist taught her stretching and exercises to do and encouraged her to stay as active as she can with walking or activities to her tolerance.  Patient denies any other pedal complaints or issues at this time.   Patient is assisted by older sister this visit.  Review of Systems  All other systems reviewed and are negative.    Patient Active Problem List   Diagnosis Date Noted  . Chronic kidney disease, stage 2 (mild) 01/05/2020  . Hypertensive chronic kidney disease with stage 1 through stage 4 chronic kidney disease, or unspecified chronic kidney disease 01/05/2020  . Snoring 01/05/2020  . Neuroleptic-induced Parkinsonism ([place]) 09/28/2019  . Class 3 severe obesity due to excess calories with serious comorbidity and body mass index (BMI) of 40.0 to 44.9 in adult [facility]) 05/22/2019  . Pulmonary hypertension,  unspecified ([place]) 04/06/2019  . Syncope 02/24/2019  . Rheumatoid arthritis ([place]) 01/21/2019  . Radial styloid tenosynovitis 11/24/2018  . Type 2 diabetes mellitus with stage 2 chronic kidney disease, with long-term current use of insulin ([place]) 07/21/2018  . Chest pain 05/15/2018  . 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 Excessive daytime sleepiness 01/30/2018  . Sleep-related hypoventilation 01/30/2018  . Recurrent depression ([place]) 11/06/2017  . OSA on CPAP 10/07/2017  . Todd's paralysis ([place])   . Chronic diastolic CHF (congestive heart failure), NYHA class 2 ([place])   . Chronic respiratory failure with hypoxia ([place])   . Acute lower GI bleeding   . Stroke-like symptoms 06/15/2017  . Rectal bleeding 06/15/2017  . Dehydration 03/07/2017  . Medication management 12/24/2016  . Near syncope 03/09/2016  . Racing heart beat 03/09/2016  . Spinal stenosis at L4-L5 level 10/04/2014  . Parkinsonism ([place]) 06/24/2013  . Lower abdominal pain 10/31/2012  . Type II diabetes mellitus, uncontrolled ([place]) 10/31/2012  . Seizure disorder ([place]) 10/31/2012  . Diverticulosis 10/31/2012  . Anemia associated with acute blood loss 08/14/2012  . Hematochezia 08/14/2012  . Varicose veins of bilateral lower extremities with other complications 72/53/6644  . Leg pain 03/21/2012  . Radicular pain of left lower extremity 03/21/2012  . Heart murmur 11/21/2011  . Infected pseudocyst of pancreas 10/28/2011  . Chronic pancreatitis ([place]) 10/28/2011  . Hypertensive heart disease with chronic diastolic congestive heart failure ([place]) 10/28/2011  .  Dyslipidemia, goal LDL below 100 10/28/2011  . Morbid obesity with BMI of 45.0-49.9, adult ([place]) 10/28/2011  . Pseudoseizure 10/24/2011  . Gastroesophageal reflux 10/23/2011  . History of tobacco abuse 10/23/2011  . Hypoxemia 10/23/2011  . Disturbance in sleep behavior 09/03/2011  . Partial epilepsy with  impairment of consciousness ([place]) 09/03/2011  . Tremor 09/03/2011  . Peripheral edema 04/14/2011  . Obesity hypoventilation syndrome ([place]) 10/26/2010  . Atherosclerotic heart disease of native coronary artery with other forms of angina pectoris ([place]) 08/28/2010  . Acute, but ill-defined, cerebrovascular disease 05/04/2008  . Arthropathy 05/04/2008  . Disequilibrium 05/04/2008  . Generalized tonic clonic epilepsy ([place]) 05/04/2008  . Increased frequency of urination 05/04/2008  . Insomnia, unspecified 05/04/2008  . Major depressive disorder, single episode, unspecified 05/04/2008  . Memory loss 05/04/2008  . Nausea 05/04/2008  . Other malaise and fatigue 05/04/2008  . Swelling of limb 05/04/2008    Current Outpatient Medications on File Prior to Visit  Medication Sig Dispense Refill  . Accu-Chek Softclix Lancets lancets Use as instructed to check blood sugars 2 times per day dx: e11.22 300 each 3  . acetaminophen (TYLENOL) 500 MG tablet Take 500-1,000 mg by mouth every 4 (four) hours as needed for moderate pain.     Marland Kitchen albuterol (VENTOLIN HFA) 108 (90 Base) MCG/ACT inhaler Inhale 1-2 puffs into the lungs every 6 (six) hours as needed for wheezing or shortness of breath.    Marland Kitchen aspirin 81 MG chewable tablet Chew 1 tablet (81 mg total) by mouth daily.    Marland Kitchen atorvastatin (LIPITOR) 10 MG tablet Take 1 tablet (10 mg total) by mouth every evening. 90 tablet 2  . B Complex-C (B-COMPLEX WITH VITAMIN C) tablet Take 1 tablet by mouth daily.    . B-D ULTRAFINE III SHORT PEN 31G X 8 MM MISC USE DAILY WITH VICTOZA AND/OR NOVOLOG. 400 each 1  . Blood Glucose Calibration (ACCU-CHEK AVIVA) SOLN Use as directed dx: e11.22 1 each 3  . Blood Glucose Monitoring Suppl (ACCU-CHEK AVIVA PLUS) w/Device KIT Use as directed to check blood sugars 2 times per day dx: e11.22 1 kit 3  . carvedilol (COREG) 25 MG tablet TAKE 1 TABLET BY MOUTH TWICE DAILY WITH MEALS 180 tablet 0  . Cholecalciferol (VITAMIN D-3) 1000 units  CAPS Take 1,000 Units by mouth daily.    Marland Kitchen dexlansoprazole (DEXILANT) 60 MG capsule Take 1 capsule (60 mg total) by mouth daily. 30 capsule 0  . diclofenac sodium (VOLTAREN) 1 % GEL Apply 2 g topically 4 (four) times daily as needed (pain).    . furosemide (LASIX) 80 MG tablet Take 80 mg by mouth daily.     Marland Kitchen glucose blood (ACCU-CHEK AVIVA PLUS) test strip Use as instructed to check blood sugars 2 times per day dx: e11.22 300 each 3  . hydrocortisone cream 1 % Apply 1 application topically daily as needed for itching.    . insulin lispro (HUMALOG KWIKPEN) 100 UNIT/ML KwikPen Inject 0.02-0.04 mLs (2-4 Units total) into the skin 3 (three) times daily as needed (high blood sugar over 150). 15 mL 2  . Isopropyl Alcohol (ALCOHOL WIPES) 70 % MISC Apply 1 each topically 2 (two) times daily. 300 each 3  . isosorbide mononitrate (IMDUR) 30 MG 24 hr tablet Take 1 tablet (30 mg total) by mouth daily. (Patient taking differently: Take 60 mg by mouth daily. ) 30 tablet 11  . lamoTRIgine (LAMICTAL) 150 MG tablet Take 1 tablet (150 mg total) by mouth 2 (  two) times daily. 180 tablet 3  . levETIRAcetam (KEPPRA) 500 MG tablet 2 tablets at bedtime 60 tablet 3  . Menthol, Topical Analgesic, (STOPAIN ROLL-ON EX) Apply 1 application topically daily as needed (pain).    . Multiple Vitamins-Minerals (MULTIVITAMIN WITH MINERALS) tablet Take 1 tablet by mouth daily.    Marland Kitchen neomycin-bacitracin-polymyxin (NEOSPORIN) ointment Apply 1 application topically as needed for wound care.     Marland Kitchen OZEMPIC, 1 MG/DOSE, 2 MG/1.5ML SOPN INJECT '1MG'$  INTO THE SKIN ONCE A WEEK (Patient taking differently: Inject 1 mg into the muscle once a week. Thursday) 12 mL 0  . Pancrelipase, Lip-Prot-Amyl, (ZENPEP) 25000-79000 units CPEP Take 1-2 capsules by mouth See admin instructions. Take 2 capsules in the morning, 1 capsule with lunch, and 2 capsules in the evening.    . polyethylene glycol (MIRALAX / GLYCOLAX) 17 g packet Take 17 g by mouth daily as  needed for moderate constipation or severe constipation. 14 each 0  . potassium chloride SA (KLOR-CON) 20 MEQ tablet TAKE 2 TABLETS EVERY DAY BUT WHEN TAKING METOLAZONE TAKE 3 TABLETS DAILY AS DIRECTED 204 tablet 2  . sertraline (ZOLOFT) 50 MG tablet Take 1 tablet (50 mg total) by mouth daily. 90 tablet 2  . tolnaftate (TINACTIN) 1 % cream Apply 1 application topically daily as needed (foot fungus).     [name] SOLOSTAR 300 UNIT/ML Solostar Pen INJECT 36 UNITS INTO THE SKIN AT BEDTIME. (Patient taking differently: 34 Units. ) 9 pen 2  . hydrALAZINE (APRESOLINE) 25 MG tablet Take 3 tablets (75 mg total) by mouth 3 (three) times daily. 270 tablet 3   No current facility-administered medications on file prior to visit.    Allergies  Allergen Reactions  . Other Anaphylaxis and Rash    Bleach  . Penicillins Hives    Did it involve swelling of the face/tongue/throat, SOB, or low BP? No Did it involve sudden or severe rash/hives, skin peeling, or any reaction on the inside of your mouth or nose? Yes Did you need to seek medical attention at a hospital or doctor's office? Yes When did it last happen?Childhood allergy  If all above answers are "NO", may proceed with cephalosporin use.    . Sulfa Antibiotics Hives  . Aspirin Other (See Comments)     On aspirin 81 mg - Rectal bleeding in dec 2018  . Codeine     Headache and makes the patient feel "off"    Objective:  General: Alert and oriented x3 in no acute distress  Dermatology: No open lesions bilateral lower extremities, no webspace macerations, no ecchymosis bilateral, all nails x 10 are well manicured.  Vascular: Dorsalis Pedis and Posterior Tibial pedal pulses palpable, Capillary Fill Time 5 seconds,(+) scant pedal hair growth bilateral, trace edema bilateral lower extremities, Temperature gradient within normal limits.  Neurology: Johney Maine sensation intact via light touch bilateral, subjective sharp shooting pain lateral foot  bilateral.   Musculoskeletal: Mild to moderate pain and limitation with range of motion bilateral there is fixed severe pes planus deformity with lateral ankle impingement there is end-stage valgus of the rear foot that appears to be fixed with significant genu valgum noted and guarding with range of motion even at the level of the knee and hip bilateral   Gait: Antalgic gait Cane assisted  Xrays and previous MRI reviewed from 2014 that patient brought with her a paper copy with multiple prescriptions and copies for orthotics custom insoles and shoes  Assessment and Plan: Problem List Items  Addressed This Visit      Musculoskeletal and Integument   Rheumatoid arthritis ([place]) - Primary   Relevant Orders   ANA   Rheumatoid factor   C-reactive protein   Sedimentation rate   CBC with Differential/Platelet   Uric acid   HLA-B27 antigen    Other Visit Diagnoses    Tendinitis       Pes planus of both feet       Genu valgum, acquired, unspecified laterality       Chronic foot pain, unspecified laterality           -Complete examination performed -Xrays reviewed consistent with end-stage pes planus and arthritis involving the ankle joint with the changes on the left being more progressive than the right and advised patient due to the old nature of these images likely her arthritis since changes to her joints probably have progressed even more since 2014 -Discussed treatement options for end-stage pes planus chronic foot pain questionable involvement of inflammatory joint conditions like rheumatoid arthritis -Rx arthritic panel -Advised patient to continue with supportive devices walkers or cane for ambulatory support -Continue with good supportive shoes and exercises taught by physical therapy and activities to tolerance -Advised patient likelihood that her pain will continue to be present and that she has already exhausted previous conservative options and that the other alternative if  her rheumatoid panel is negative is to consider surgery of ankle fusions however I did educate patient on the lack of success of this procedure for her condition because she has knee as well as hip involvement as well -Patient to return to office after blood work or sooner if condition worsens.  Landis Martins, DPM

## 2020-01-06 ENCOUNTER — Ambulatory Visit (INDEPENDENT_AMBULATORY_CARE_PROVIDER_SITE_OTHER): Payer: Medicare HMO

## 2020-01-06 ENCOUNTER — Other Ambulatory Visit: Payer: Medicare HMO

## 2020-01-06 VITALS — BP 136/64 | HR 78 | Temp 98.1°F | Ht 61.0 in | Wt 215.8 lb

## 2020-01-06 DIAGNOSIS — M069 Rheumatoid arthritis, unspecified: Secondary | ICD-10-CM | POA: Diagnosis not present

## 2020-01-06 DIAGNOSIS — N182 Chronic kidney disease, stage 2 (mild): Secondary | ICD-10-CM

## 2020-01-06 DIAGNOSIS — Z0000 Encounter for general adult medical examination without abnormal findings: Secondary | ICD-10-CM | POA: Diagnosis not present

## 2020-01-06 DIAGNOSIS — E1122 Type 2 diabetes mellitus with diabetic chronic kidney disease: Secondary | ICD-10-CM | POA: Diagnosis not present

## 2020-01-06 DIAGNOSIS — Z794 Long term (current) use of insulin: Secondary | ICD-10-CM | POA: Diagnosis not present

## 2020-01-06 LAB — POCT UA - MICROALBUMIN
Albumin/Creatinine Ratio, Urine, POC: <30
Creatinine, POC: 50 mg/dL
Microalbumin Ur, POC: 10 mg/L

## 2020-01-06 NOTE — Progress Notes (Signed)
This visit occurred during the SARS-CoV-2 public health emergency.  Safety protocols were in place, including screening questions prior to the visit, additional usage of staff PPE, and extensive cleaning of exam room while observing appropriate contact time as indicated for disinfecting solutions.  Subjective:   [name] is a 74 y.o. female who presents for Medicare Annual (Subsequent) preventive examination.  Review of Systems     Cardiac Risk Factors include: advanced age (>78mn, >>78women);diabetes mellitus;hypertension;obesity (BMI >30kg/m2);sedentary lifestyle     Objective:    Today's Vitals   01/06/20 1059 01/06/20 1110  BP: 136/64   Pulse: 78   Temp: 98.1 F (36.7 C)   TempSrc: Oral   SpO2: 95%   Weight: 215 lb 12.8 oz (97.9 kg)   Height: _0  (1.549 m)   PainSc:  9    Body mass index is 40.78 kg/m.  Advanced Directives 01/06/2020 11/08/2019 09/11/2019 08/01/2019 04/06/2019 02/24/2019 10/22/2018  Does Patient Have a Medical Advance Directive? Yes No _1   Type of AParamedicof ASaint DavidsLiving will - H[place]orefieldLiving will (No Data) H[place]Living will Living will;Healthcare Power of [place]ow[place]Living will  Does patient want to make changes to medical advance directive? - - - No - Patient declined No - Patient declined No - Patient declined -  Copy of [place] Chart? Yes - validated most recent copy scanned in chart (See row information) - - - No - copy requested No - copy requested No - copy requested  Would patient like information on creating a medical advance directive? - No - Patient declined - - - - -  Pre-existing out of facility DNR order (yellow form or pink MOST form) - - - - - - -    Current Medications (verified) Outpatient Encounter Medications as of 01/06/2020  Medication Sig  . Accu-Chek Softclix Lancets lancets Use as  instructed to check blood sugars 2 times per day dx: e11.22  . acetaminophen (TYLENOL) 500 MG tablet Take 500-1,000 mg by mouth every 4 (four) hours as needed for moderate pain.   .Marland Kitchenalbuterol (VENTOLIN HFA) 108 (90 Base) MCG/ACT inhaler Inhale 1-2 puffs into the lungs every 6 (six) hours as needed for wheezing or shortness of breath.  .Marland Kitchenaspirin 81 MG chewable tablet Chew 1 tablet (81 mg total) by mouth daily.  .Marland Kitchenatorvastatin (LIPITOR) 10 MG tablet Take 1 tablet (10 mg total) by mouth every evening.  . B Complex-C (B-COMPLEX WITH VITAMIN C) tablet Take 1 tablet by mouth daily.  . B-D ULTRAFINE III SHORT PEN 31G X 8 MM MISC USE DAILY WITH VICTOZA AND/OR NOVOLOG.  . Blood Glucose Calibration (ACCU-CHEK AVIVA) SOLN Use as directed dx: e11.22  . Blood Glucose Monitoring Suppl (ACCU-CHEK AVIVA PLUS) w/Device KIT Use as directed to check blood sugars 2 times per day dx: e11.22  . carvedilol (COREG) 25 MG tablet TAKE 1 TABLET BY MOUTH TWICE DAILY WITH MEALS  . Cholecalciferol (VITAMIN D-3) 1000 units CAPS Take 1,000 Units by mouth daily.  .Marland Kitchendexlansoprazole (DEXILANT) 60 MG capsule Take 1 capsule (60 mg total) by mouth daily.  . diclofenac sodium (VOLTAREN) 1 % GEL Apply 2 g topically 4 (four) times daily as needed (pain).  . furosemide (LASIX) 80 MG tablet Take 80 mg by mouth daily.   .Marland Kitchenglucose blood (ACCU-CHEK AVIVA PLUS) test strip Use as instructed to check blood sugars 2 times per  day dx: e11.22  . hydrocortisone cream 1 % Apply 1 application topically daily as needed for itching.  . insulin lispro (HUMALOG KWIKPEN) 100 UNIT/ML KwikPen Inject 0.02-0.04 mLs (2-4 Units total) into the skin 3 (three) times daily as needed (high blood sugar over 150).  . Isopropyl Alcohol (ALCOHOL WIPES) 70 % MISC Apply 1 each topically 2 (two) times daily.  . isosorbide mononitrate (IMDUR) 30 MG 24 hr tablet Take 1 tablet (30 mg total) by mouth daily. (Patient taking differently: Take 60 mg by mouth daily. )  .  lamoTRIgine (LAMICTAL) 150 MG tablet Take 1 tablet (150 mg total) by mouth 2 (two) times daily.  Marland Kitchen levETIRAcetam (KEPPRA) 500 MG tablet 2 tablets at bedtime  . Menthol, Topical Analgesic, (STOPAIN ROLL-ON EX) Apply 1 application topically daily as needed (pain).  . Multiple Vitamins-Minerals (MULTIVITAMIN WITH MINERALS) tablet Take 1 tablet by mouth daily.  Marland Kitchen neomycin-bacitracin-polymyxin (NEOSPORIN) ointment Apply 1 application topically as needed for wound care.   Marland Kitchen OZEMPIC, 1 MG/DOSE, 2 MG/1.5ML SOPN INJECT 1MG INTO THE SKIN ONCE A WEEK (Patient taking differently: Inject 1 mg into the muscle once a week. Thursday)  . Pancrelipase, Lip-Prot-Amyl, (ZENPEP) 25000-79000 units CPEP Take 1-2 capsules by mouth See admin instructions. Take 2 capsules in the morning, 1 capsule with lunch, and 2 capsules in the evening.  . polyethylene glycol (MIRALAX / GLYCOLAX) 17 g packet Take 17 g by mouth daily as needed for moderate constipation or severe constipation.  . potassium chloride SA (KLOR-CON) 20 MEQ tablet TAKE 2 TABLETS EVERY DAY BUT WHEN TAKING METOLAZONE TAKE 3 TABLETS DAILY AS DIRECTED  . sertraline (ZOLOFT) 50 MG tablet Take 1 tablet (50 mg total) by mouth daily.  Marland Kitchen tolnaftate (TINACTIN) 1 % cream Apply 1 application topically daily as needed (foot fungus).   [name] SOLOSTAR 300 UNIT/ML Solostar Pen INJECT 36 UNITS INTO THE SKIN AT BEDTIME. (Patient taking differently: 34 Units. )  . hydrALAZINE (APRESOLINE) 25 MG tablet Take 3 tablets (75 mg total) by mouth 3 (three) times daily.   No facility-administered encounter medications on file as of 01/06/2020.    Allergies (verified) Other, Penicillins, Sulfa antibiotics, Aspirin, and Codeine   History: Past Medical History:  Diagnosis Date  . Abnormal liver function     in the past.  . Anemia   . Arthritis    all over  . Bruises easily   . Cataract    right eye;immature  . Chronic back pain    stenosis  . Chronic cough   . Chronic  kidney disease   . COPD (chronic obstructive pulmonary disease) ([place])   . Demand myocardial infarction [facility]) 2012   Demand Infarction in setting of Pancreatitis --> mild Troponin elevation, NON-OBSTRUCTIVE CAD  . Depression    takes Abilify daily as well as Zoloft  . Diastolic heart failure 0233   Grade 1 diastolic Dysfunction by Echo   . Diverticulosis   . DM (diabetes mellitus) ([place])    takes Victoza daily as well as Lantus and Humalog  . Empty sella ([place])    on MRI in 2009.  . Glaucoma   . Headache(784.0)    last migraine-4-36yr ago  . History of blood transfusion    no abnormal reaction noted  . History of colon polyps    benign  . HTN (hypertension)    takes BKinder Morgan Energydaily  . Hyperlipidemia    takes Lipitor daily  . Joint swelling   . Nocturia   .  Obesity hypoventilation syndrome ([place])   . Obstructive sleep apnea 02/2018   Notably improved split-night study with weight loss from 270 (2017) down to 250 pounds (2019).  . Pancreatitis    takes Pancrelipase daily  . Parkinson's disease ([place])    takes Sinemet daily  . Peripheral neuropathy   . Pneumonia 2012  . Seizures ([place])    takes Lamictal daily and Primidone nightly;last seizure 2wks ago  . Urinary urgency    With increased frequency  . Varicose veins of both lower extremities with pain    With edema.  Takes daily Lasix   Past Surgical History:  Procedure Laterality Date  . ABDOMINAL HYSTERECTOMY    . APPENDECTOMY    . BACK SURGERY    . Cardiac Event Monitor  September-October 2017   Sinus rhythm with occasional PACs and artifact. No arrhythmias besides one short run of tachycardia.  . CHOLECYSTECTOMY    . COLONOSCOPY N/A 08/15/2012   Procedure: COLONOSCOPY;  Surgeon: Arta Silence, MD;  Location: [facility];  Service: Endoscopy;  Laterality: N/A;  . COLONOSCOPY WITH PROPOFOL Left 06/17/2017   Procedure: COLONOSCOPY WITH PROPOFOL;  Surgeon: [name], MD;  Location: [place];   Service: Gastroenterology;  Laterality: Left;  . CORONARY CALCIUM SCORE AND CTA  04/2018   Coronary calcium score 71.9.  Very large, hyperdynamic LAD wrapping apex giving rise to PDA.  Moderate LAD-diagonal and circumflex plaque -> CT FFR suggested positive findings in distal D1, D2 and distal circumflex.  Referred for cath. ==> FALSE POSITIVE  . ESOPHAGOGASTRODUODENOSCOPY    . eye cysts Bilateral   . LASIK    . LEFT HEART CATH AND CORONARY ANGIOGRAPHY N/A 05/16/2018   Procedure: LEFT HEART CATH AND CORONARY ANGIOGRAPHY;  Surgeon: Belva Crome, MD;  Location: [facility] INVASIVE CV LAB:  Angiographically normal coronary arteries with LVEDP of 21 mmHg.  Large draping hyperdominant LAD that wraps the apex and provides distal half of the PDA.  Relatively small caliber distal Cx -->proxLPDA, non-dom RCA. No Cx or Diag lesions -- FALSE + CT FFR  . LEFT HEART CATH AND CORONARY ANGIOGRAPHY  2009/March 2012   2009: (Dr. Alvie Heidelberg) Nonobstructive CAD; 2012: Minimal CAD --> false positive stress test  . NM MYOVIEW LTD  01/2016; 01/2018   a)LOW RISK. No ischemia or infarction;; b) EF 55-60%. NO ST changes.  No ischemia or Infarction. NO significant RV enlargement.  LOW RISK.  Marland Kitchen Polysomnogram  02/2018   (Dr. Brett Fairy from neurology): Split study was not adequately done due to low AHI.  She slept reclined and had an AHI less than 10, prolonged hypoxemia and no hypercapnia. -->  She has home oxygen already prescribed, but did not meet criteria for nightly oxygen.  (Was noted that the patient did not cooperate well with study.  Titration was discussed.  Marland Kitchen PRESSURE SENSOR/CARDIOMEMS N/A 11/09/2019   Procedure: PRESSURE SENSOR/CARDIOMEMS;  Surgeon: Larey Dresser, MD;  Location: [facility] CV LAB;  Service: Cardiovascular;  Laterality: N/A;  . RECTAL POLYPECTOMY    . RIGHT/LEFT HEART CATH AND CORONARY ANGIOGRAPHY N/A 04/06/2019   Procedure: RIGHT/LEFT HEART CATH AND CORONARY ANGIOGRAPHY;  Surgeon: Leonie Man, MD;   Location: [facility] INVASIVE CV LAB; angiographically normal coronary arteries.  Moderately elevated AD LVEDP..  Mild pulmonary hypertension: PA P 56/14 mmHg-mean 31 mmHg.  RAP 10 mmHg.  RV P-EDP 54/4 mmHg - 12 mmHg.  PCWP 11-15 mmHg.  CO-CI: 7.35-3.73.  . TONSILLECTOMY    . TRANSTHORACIC ECHOCARDIOGRAM  01/2016;  05/2017   A) Normal LV chamber size with mod LVH pattern. EF 50-55%. Severe LA dilation. Mod RA dilation. PAP elevated at 38 mmHg;; B) Mild concentric LVH.  EF 55-60% & no RWMA.  Grade 2 DD.  Diastolic flattening of the ventricular septum == ? elevated PAP.  Mild aortic valve calcification/sclerosis.  Mod LA dilation.  Mild RV dilation & Mod RA dilation  . TRANSTHORACIC ECHOCARDIOGRAM  10/2017; 01/2018   a) EF 55-60%.  No RWMA,  Moderate LA dilation.  Mild LA dilation.  Peak PA pressures ~57 mmHg (moderate);;; b) EF 55-60%.  GRII DD.  Suggestion of RV volume overload with moderate RV dilation.  Severe LA and RA dilation.Marland Kitchen  PA pressure ~67%.  . TRANSTHORACIC ECHOCARDIOGRAM  01/2019   Normal LV size and function.  EF 50 to 55%.  Impaired relaxation.  RV appears to have moderately reduced function.  Severely enlarged.  Cannot fully assess RV pressures.  Hypermobile interatrial septum.  Right atrium severely dilated.  . TUBAL LIGATION    . vaginal cyst removed     several times   Family History  Problem Relation Age of Onset  . Allergies Father   . Heart disease Father        before 56  . Heart failure Father   . Hypertension Father   . Hyperlipidemia Father   . Heart disease Mother   . Diabetes Mother   . Hypertension Mother   . Hyperlipidemia Mother   . Hypertension Sister   . Heart disease Sister        before 58  . Hyperlipidemia Sister   . Diabetes Brother   . Hypertension Brother   . Diabetes Sister   . Hypertension Sister   . Diabetes Son   . Hypertension Son   . Cancer Other    Social History   Socioeconomic History  . Marital status: Divorced    Spouse name: Not on file    . Number of children: Not on file  . Years of education: Not on file  . Highest education level: Not on file  Occupational History  . Occupation: Disabled    Comment: CNA  Tobacco Use  . Smoking status: Former Smoker    Packs/day: 0.50    Years: 25.00    Pack years: 12.50    Types: Cigarettes  . Smokeless tobacco: Never Used  . Tobacco comment: quit smoking 20+ytrs ago  Vaping Use  . Vaping Use: Never used  Substance and Sexual Activity  . Alcohol use: No  . Drug use: No  . Sexual activity: Not Currently    Birth control/protection: Surgical  Other Topics Concern  . Not on file  Social History Narrative   She lives in [place]. She has lots of family in the area and is accompanied by her sister.   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   She has a distant history of smoking, quit 20 years ago.   Social Determinants of Health   Financial Resource Strain: Low Risk   . Difficulty of Paying Living Expenses: Not hard at all  Food Insecurity: No Food Insecurity  . Worried About Programme researcher, broadcasting/film/video in the Last Year: Never true  . Ran Out of Food in the Last Year: Never true  Transportation Needs: No Transportation Needs  . Lack of Transportation (Medical): No  . Lack of Transportation (Non-Medical): No  Physical Activity: Inactive  . Days of Exercise per Week: 0  days  . Minutes of Exercise per Session: 0 min  Stress: No Stress Concern Present  . Feeling of Stress : Not at all  Social Connections:   . Frequency of Communication with Friends and Family:   . Frequency of Social Gatherings with Friends and Family:   . Attends Religious Services:   . Active Member of Clubs or Organizations:   . Attends Archivist Meetings:   Marland Kitchen Marital Status:     Tobacco Counseling Counseling given: Not Answered Comment: quit smoking 20+ytrs ago   Clinical Intake:  Pre-visit preparation completed: Yes  Pain : 0-10 Pain Score: 9  Pain  Type: Chronic pain Pain Location: Foot Pain Orientation: Right, Left Pain Descriptors / Indicators: Sharp Pain Onset: More than a month ago Pain Frequency: Constant Pain Relieving Factors: nothing  Pain Relieving Factors: nothing  Nutritional Status: BMI > 30  Obese Nutritional Risks: None Diabetes: Yes  How often do you need to have someone help you when you read instructions, pamphlets, or other written materials from your doctor or pharmacy?: 1 - Never What is the last grade level you completed in school?: some college  Diabetic? Yes Nutrition Risk Assessment:  Has the patient had any N/V/D within the last 2 months?  No  Does the patient have any non-healing wounds?  No  Has the patient had any unintentional weight loss or weight gain?  No   Diabetes:  Is the patient diabetic?  Yes  If diabetic, was a CBG obtained today?  No  Did the patient bring in their glucometer from home?  No  How often do you monitor your CBG's? Three times daily.   Financial Strains and Diabetes Management:  Are you having any financial strains with the device, your supplies or your medication? No .  Does the patient want to be seen by Chronic Care Management for management of their diabetes?  No  Would the patient like to be referred to a Nutritionist or for Diabetic Management?  No   Diabetic Exams:  Diabetic Eye Exam: Completed 04/14/2020 Diabetic Foot Exam: Overdue, Pt has been advised about the importance in completing this exam. Pt is scheduled for diabetic foot exam on next appointment.   Interpreter Needed?: No  Information entered by :: NAllen LPN   Activities of Daily Living In your present state of health, do you have any difficulty performing the following activities: 01/06/2020 11/08/2019  Hearing? Y -  Comment a little -  Vision? Y -  Comment blurry sometimes -  Difficulty concentrating or making decisions? Y -  Comment forgetfulness -  Walking or climbing stairs? Y -    Comment uses cane -  Dressing or bathing? N -  Doing errands, shopping? N Y  Conservation officer, nature and eating ? N -  Using the Toilet? N -  In the past six months, have you accidently leaked urine? Y -  Comment wears a pad -  Do you have problems with loss of bowel control? N -  Managing your Medications? Y -  Comment sister helps sometimes -  Managing your Finances? N -  Housekeeping or managing your Housekeeping? N -  Some recent data might be hidden    Patient Care Team: [name], MD as PCP - General (Internal Medicine) Leonie Man, MD as PCP - Cardiology (Cardiology) Conni Slipper, NP as Referring Physician (Nurse Practitioner) Arta Silence, MD as Attending Physician (Gastroenterology) Elayne Snare, MD as Attending Physician (Endocrinology) Matilde Bash, MD as Referring  Physician (Neurology) Inocencio Homes, DPM as Consulting Physician (Podiatry) Daneen Schick as Social Worker [place], Claudette Stapler, RN as Case Manager [place], Kennieth Francois, [facility] (Pharmacist)  Indicate any recent Medical Services you may have received from other than [facility] providers in the past year (date may be approximate).     Assessment:   This is a routine wellness examination for [name].  Hearing/Vision screen  Hearing Screening   _0  _1  _2  _3  _4  _5  _6  _7  _8   Right ear:           Left ear:           Vision Screening Comments: Regular eye exams, Dr. Katy Fitch  Dietary issues and exercise activities discussed: Current Exercise Habits: The patient does not participate in regular exercise at present  Goals    .  CHF: Complete daily weights and know when to call the doctor      [place] (see longtitudinal plan of care for additional care plan information)  Current Barriers:  Marland Kitchen Knowledge Deficits related to disease process and Self Health management of CHF . Chronic Disease Management support and education needs related to CHF, DM, Seizures  Nurse Case Manager  Clinical Goal(s):  Marland Kitchen Over the next 90 days, patient will work with the [place] and Cardiologist to address needs related to disease education and support of CHF to improve Self Health management and reduce CHF exacerbation   CCM RN CM Interventions:  09/08/19 call completed with patient  . Evaluation of current treatment plan related to CHF and patient's adherence to plan as established by provider. . Discussed plans with patient for ongoing care management follow up and provided patient with direct contact information for care management team . Provided patient with printed educational materials related to CHF Zone Safety Tool  . Reviewed scheduled/upcoming provider appointments including: 09/08/19 _9 :00 PM Dr. [name] Cardiology   Patient Self Care Activities:  . Self administers medications as prescribed . Performs ADL's independently  Initial goal documentation     .  Diabetes: "I would like to learn more about how to manage my diabetes" (pt-stated)      Current Barriers:  Marland Kitchen Knowledge Deficits related to disease process and Self Health Management of diabetes . Chronic Disease Management support and education needs related to DM, CHF, Seizures  Nurse Case Manager Clinical Goal(s):  Marland Kitchen Over the next 60 days, patient will work with the CCM team to address needs related to providing education and ADA recommendations to help lower her A1C and Meal planning using the plate method  Goal Met  . Over the next 90 days patient will lower her A1C < 8.6 Goal Met  . 09/08/19 New Over the next 90 days, patient will maintain blood glucose reading of less than 180 mg/dL; fasting blood glucose levels of less than >80 <130 mg/dL; hemoglobin A1C level <7%  CCM RN CM Interventions:  09/08/19 call completed with patient   . Evaluation of current treatment plan related to type 2 Diabetes and patient's adherence to plan as established by provider. . Provided education to patient re: current A1C of 8.0;  educated patient on target A1C of <7.0; discussed interventions to help achieve goal including adherence to a diabetic friendly diet, implementing exercise in her daily routine and adherence to taking her medications exactly as prescribed . Reviewed medications with patient and discussed patient reports adhering to her prescribed medication regimen for diabetes; reviewed indication, dosage and frequency of prescribed meds, patient denies having  noted SE; patient requests a refill for Lispro (Humalog) insulin to adhere to sliding scale . Collaborated with PCP via in basket message  regarding patient's need for Humalog insulin refill  . Discussed plans with patient for ongoing care management follow up and provided patient with direct contact information for care management team . Provided patient with printed educational materials related to Meal Planning using the plate, Know Your A1C, s/s of hypo/hyperglycemia, Diabetes Zone Safety Tool  Patient Self Care Activities:  . Self administers medications as prescribed . Performs ADL's independently  Please see past updates related to this goal by clicking on the "Past Updates" button in the selected goal      .  I would like to manage and optimize my medications for my chronic conditions (pt-stated)      Current Barriers:  . Diabetes: T2DM; most recent A1c 7.5% on 11/24 (was 8.5% on 02/24/19)  . Current antihyperglycemic regimen: Ozempic, Toujeo 34 units qHS o 07/06/2019-Accucheck Aviva glucometer-->test strips and supplies called in for 1 year ([facility] mail order) . denies hypoglycemic symptoms; denies hyperglycemic symptoms . Current meal patterns: . Current exercise: n/a; motivated patient to participate in exercise as able and as recommended by PCP . Current blood glucose readings: FBG 110-130s.  This has improved! . Cardiovascular risk reduction: o Current hypertensive regimen: carvedilol, hydralazine, isosorbide o Current hyperlipidemia regimen:   atorvastatin 81m (last filled #90 on 12/24/18) o Of note, patient is on lasix for CHF.  Reviewed new cardiology dosing with patient per chart. Her weights have gone from 206-->203,204.  Encouraged her to call cardiologist if needs arise.  She states her SOB/pain have decreased.  She has follow up on 03/31/19  Pharmacist Clinical Goal(s):  .Marland KitchenOver the next 90 days, patient with work with PharmD and primary care provider to address needs related to optimization of medication management of chronic conditions (diabetes).  Interventions: . Comprehensive medication review performed, medication list updated in electronic medical record.  Reviewed & discussed the following diabetes-related information with patient: o Reviewed ADA recommended "diabetes-friendly" diet  (reviewed healthy snack/food options) o Discussed GLP-1 injection technique; Patient uses AccuCheck glucometer o Reviewed medication purpose/side effects-->patient denies adverse events  Patient Self Care Activities:  . Patient will check blood glucose daily , document, and provide at future appointments . Patient will focus on medication adherence by continuing to take medications as prescribed. . Patient will take medications as prescribed . Patient will contact provider with any episodes of hypoglycemia . Patient will report any questions or concerns to provider   Please see past updates related to this goal by clicking on the "Past Updates" button in the selected goal      .  Patient Stated      01/06/2020, stay healthy    .  Pharmacy Care Plan      CARE PLAN ENTRY (see longitudinal plan of care for additional care plan information)  Current Barriers:  . Chronic Disease Management support, education, and care coordination needs related to Hypertension, Hyperlipidemia, Diabetes, and Heart Failure   Hypertension BP Readings from Last 3 Encounters:  12/21/19 116/70  12/17/19 140/65  12/03/19 132/74   . Pharmacist Clinical  Goal(s): o Over the next 90 days, patient will work with PharmD and providers to achieve BP goal <130/80 . Current regimen:  o Carvedilol 221mtwice daily o Furosemide 80 mg daily  o Hydralazine 2532m tablets twice daily . Interventions: o Clarify dosing of hydralazine with cardiologist . Patient self  care activities - Over the next 90 days, patient will: o Check BP daily, document, and provide at future appointments o Ensure daily salt intake < 2300 mg/day  Hyperlipidemia Lab Results  Component Value Date/Time   LDLCALC 65 12/17/2019 11:46 AM   LDLCALC 65 10/22/2018 02:46 PM   LDLDIRECT 66.0 01/27/2015 11:04 AM   . Pharmacist Clinical Goal(s): o Over the next 90 days, patient will work with PharmD and providers to maintain LDL goal < 70 . Current regimen:  o Atorvastatin 87m daily . Interventions: o Dietary recommendations provided . Patient self care activities - Over the next 90 days, patient will: o Take cholesterol medication daily as directed  Diabetes Lab Results  Component Value Date/Time   HGBA1C 7.6 (H) 11/09/2019 12:21 AM   HGBA1C 8.0 (H) 08/24/2019 03:30 PM   . Pharmacist Clinical Goal(s): o Over the next 90 days, patient will work with PharmD and providers to achieve A1c goal <7% . Current regimen:  o Humalog Kwikpen 2-4 units three times daily as needed o Ozempic 121mweekly o Toujeo 300 units/mL 34 units at bedtime . Interventions: o Dietary recommendations provided o Provided patient education about HgbA1c . Patient self care activities - Over the next 90 days, patient will: o Check blood sugar 3-4 times daily, document, and provide at future appointments o Contact provider with any episodes of hypoglycemia  Medication management . Pharmacist Clinical Goal(s): o Over the next 90 days, patient will work with PharmD and providers to maintain optimal medication adherence . Current pharmacy: [facility] Interventions o Comprehensive medication  review performed. o Medication list updated o Continue current medication management strategy o Clarified dosing of Keppra and Lamictal per Neurology telephone - Keppra 50061mhanged to 2 tablets nightly today - Continue Lamictal 150m36mice daily o Determined patient taking furosemide 80mg67mly (not Monday through Friday as noted in medication list). Cardiology supports daily dosing.  o Clarified hydralazine dosing with Cardiology . Patient self care activities - Over the next 90 days, patient will: o Focus on medication adherence by using pill box o Take medications as prescribed o Report any questions or concerns to PharmD and/or provider(s)  Please see past updates related to this goal by clicking on the "Past Updates" button in the selected goal      .  Seizures: "I need to clarify with my doctor about my Seizure meds" (pt-stated)      CARE [place] longtitudinal plan of care for additional care plan information)  Current Barriers:  . [place] Deficits related to disease education and support for Seizures . Chronic Disease Management support and education needs related to DM, CHF, Seizures   Nurse Case Manager Clinical Goal(s):  . [place] Kitchen the next 90 days, patient will work with the CCM [place] address needs related to disease education and support to improve Self Health management for Seizure disorder . Over the next 90 days, patient will experience decrease in ED visits. ED visits in last 6 months = 1 ED visit, 1 Admission   CCM RN CM Interventions:  09/08/19 call completed with patient  . Evaluation of current treatment plan related to Seizures and patient's adherence to plan as established by provider. . Provided education to patient re: importance of logging all Seizure activity including any important details such as potential triggers, aura's, time of event, etc. . Reviewed medications with patient and discussed [facility];  discussed indication, frequency  and dosage of prescribed medications including Keppra and Lamictal; Determined patient is not comfortable taking Keppra until she clarify's with Neurology . Discussed plans with patient for ongoing care management follow up and provided patient with direct contact information for care management team . Reviewed scheduled/upcoming provider appointments including: Neurology, 09/11/19 _0 :00 PM   Patient Self Care Activities:  . Self administers medications as prescribed . Performs ADL's independently  Initial goal documentation     .  Weight (lb) < 200 lb (90.7 kg) (pt-stated)      Wants to be 165 pounds      Depression Screen PHQ 2/9 Scores 01/06/2020 12/21/2019 09/28/2019 08/24/2019 05/19/2019 03/05/2019 01/21/2019  PHQ - 2 Score 1 0 0 4 0 0 0  PHQ- 9 Score 1 9 - 12 4 - 0    Fall Risk Fall Risk  01/06/2020 09/28/2019 09/11/2019 08/24/2019 03/23/2019  Falls in the past year? _1 0  Comment lost balance - - - -  Number falls in past yr: _2 0 0  Injury with Fall? 0 - 1 1 0  Comment - - - patient said that she hurt her right knee when she fell. -  Risk Factor Category  - - - - -  Risk for fall due to : Impaired balance/gait;Medication side effect;History of fall(s) - Impaired balance/gait;Impaired mobility;Impaired vision - -  Follow up Falls evaluation completed;Education provided;Falls prevention discussed - - - Falls evaluation completed    Any stairs in or around the home? Yes  If so, are there any without handrails? Yes  Home free of loose throw rugs in walkways, pet beds, electrical cords, etc? Yes  Adequate lighting in your home to reduce risk of falls? Yes   ASSISTIVE DEVICES UTILIZED TO PREVENT FALLS:  Life alert? No  Use of a cane, walker or w/c? Yes  Grab bars in the bathroom? No  Shower chair or bench in shower? Yes  Elevated toilet seat or a handicapped toilet? Yes   TIMED UP AND GO:  Was the test performed? No .    Gait slow and steady  with assistive device  Cognitive Function:     6CIT Screen 01/06/2020 10/22/2018  What Year? 0 points 0 points  What month? 0 points 0 points  What time? 0 points 0 points  Count back from 20 2 points 2 points  Months in reverse 0 points 0 points  Repeat phrase 4 points 0 points  Total Score 6 2    Immunizations Immunization History  Administered Date(s) Administered  . Influenza Split 04/11/2011, 06/30/2013  . Influenza, High Dose Seasonal PF 02/14/2017, 04/24/2018  . Influenza-Unspecified 04/07/2014  . PFIZER SARS-COV-2 Vaccination 08/06/2019, 08/31/2019  . Tdap 05/18/2012  . Zoster 04/14/2014    TDAP status: Up to date Flu Vaccine status: Up to date Pneumococcal vaccine status: Up to date Covid-19 vaccine status: Completed vaccines  Qualifies for Shingles Vaccine? Yes   Zostavax completed Yes   Shingrix Completed?: No.    Education has been provided regarding the importance of this vaccine. Patient has been advised to call insurance company to determine out of pocket expense if they have not yet received this vaccine. Advised may also receive vaccine at local pharmacy or Health Dept. Verbalized acceptance and understanding.  Screening Tests Health Maintenance  Topic Date Due  . FOOT EXAM  12/23/2019  . INFLUENZA VACCINE  01/24/2020  . OPHTHALMOLOGY EXAM  04/14/2020  . HEMOGLOBIN A1C  05/11/2020  . MAMMOGRAM  07/29/2020  . URINE MICROALBUMIN  01/05/2021  . TETANUS/TDAP  05/18/2022  . COLONOSCOPY  06/18/2027  . DEXA SCAN  Completed  . COVID-19 Vaccine  Completed  . Hepatitis C Screening  Completed  . PNA vac Low Risk Adult  Completed    Health Maintenance  Health Maintenance Due  Topic Date Due  . FOOT EXAM  12/23/2019    Colorectal cancer screening: Completed 06/17/2017 . Repeat every 5 years Mammogram status: Completed 07/2019. Repeat every year. Per patient Bone Density status: Completed 03/30/2013.   Lung Cancer Screening: (Low Dose CT Chest recommended  if Age 76-80 years, 30 pack-year currently smoking OR have quit w/in 15years.) does not qualify.   Lung Cancer Screening Referral: no  Additional Screening:  Hepatitis C Screening: does qualify; Completed 08/13/2010  Vision Screening: Recommended annual ophthalmology exams for early detection of glaucoma and other disorders of the eye. Is the patient up to date with their annual eye exam?  Yes  Who is the provider or what is the name of the office in which the patient attends annual eye exams? Dr. Katy Fitch If pt is not established with a provider, would they like to be referred to a provider to establish care? No .   Dental Screening: Recommended annual dental exams for proper oral hygiene  Community Resource Referral / Chronic Care Management: CRR required this visit?  No   CCM required this visit?  No      Plan:     I have personally reviewed and noted the following in the patient's chart:   . Medical and social history . Use of alcohol, tobacco or illicit drugs  . Current medications and supplements . Functional ability and status . Nutritional status . Physical activity . Advanced directives . List of other physicians . Hospitalizations, surgeries, and ER visits in previous 12 months . Vitals . Screenings to include cognitive, depression, and falls . Referrals and appointments  In addition, I have reviewed and discussed with patient certain preventive protocols, quality metrics, and best practice recommendations. A written personalized care plan for preventive services as well as general preventive health recommendations were provided to patient.     [name], LPN   [id number]   Nurse Notes:

## 2020-01-06 NOTE — Patient Instructions (Signed)
[name] , Thank you for taking time to come for your Medicare Wellness Visit. I appreciate your ongoing commitment to your health goals. Please review the following plan we discussed and let me know if I can assist you in the future.   Screening recommendations/referrals: Colonoscopy: completed 06/17/2017, due 06/17/2022 Mammogram: completed 07/29/2018, due now Bone Density: completed 03/30/2013 Recommended yearly ophthalmology/optometry visit for glaucoma screening and checkup Recommended yearly dental visit for hygiene and checkup  Vaccinations: Influenza vaccine: completed 03/05/2019 due 01/24/2020 Pneumococcal vaccine: completed 01/18/2016 Tdap vaccine: completed 05/18/2012, due 05/18/2022 Shingles vaccine: discussed   Covid-19:08/31/2019, 08/06/2019  Advanced directives: copy in chart  Conditions/risks identified: none  Next appointment: 02/17/2020 at 10:15 Follow up in one year for your annual wellness visit    Preventive Care 29 Years and Older, Female Preventive care refers to lifestyle choices and visits with your health care provider that can promote health and wellness. What does preventive care include?  A yearly physical exam. This is also called an annual well check.  Dental exams once or twice a year.  Routine eye exams. Ask your health care provider how often you should have your eyes checked.  Personal lifestyle choices, including:  Daily care of your teeth and gums.  Regular physical activity.  Eating a healthy diet.  Avoiding tobacco and drug use.  Limiting alcohol use.  Practicing safe sex.  Taking low-dose aspirin every day.  Taking vitamin and mineral supplements as recommended by your health care provider. What happens during an annual well check? The services and screenings done by your health care provider during your annual well check will depend on your age, overall health, lifestyle risk factors, and family history of disease. Counseling    Your health care provider may ask you questions about your:  Alcohol use.  Tobacco use.  Drug use.  Emotional well-being.  Home and relationship well-being.  Sexual activity.  Eating habits.  History of falls.  Memory and ability to understand (cognition).  Work and work Statistician.  Reproductive health. Screening  You may have the following tests or measurements:  Height, weight, and BMI.  Blood pressure.  Lipid and cholesterol levels. These may be checked every 5 years, or more frequently if you are over 54 years old.  Skin check.  Lung cancer screening. You may have this screening every year starting at age 39 if you have a 30-pack-year history of smoking and currently smoke or have quit within the past 15 years.  Fecal occult blood test (FOBT) of the stool. You may have this test every year starting at age 69.  Flexible sigmoidoscopy or colonoscopy. You may have a sigmoidoscopy every 5 years or a colonoscopy every 10 years starting at age 73.  Hepatitis C blood test.  Hepatitis B blood test.  Sexually transmitted disease (STD) testing.  Diabetes screening. This is done by checking your blood sugar (glucose) after you have not eaten for a while (fasting). You may have this done every 1-3 years.  Bone density scan. This is done to screen for osteoporosis. You may have this done starting at age 73.  Mammogram. This may be done every 1-2 years. Talk to your health care provider about how often you should have regular mammograms. Talk with your health care provider about your test results, treatment options, and if necessary, the need for more tests. Vaccines  Your health care provider may recommend certain vaccines, such as:  Influenza vaccine. This is recommended every year.  Tetanus, diphtheria,  and acellular pertussis (Tdap, Td) vaccine. You may need a Td booster every 10 years.  Zoster vaccine. You may need this after age 61.  Pneumococcal 13-valent  conjugate (PCV13) vaccine. One dose is recommended after age 45.  Pneumococcal polysaccharide (PPSV23) vaccine. One dose is recommended after age 95. Talk to your health care provider about which screenings and vaccines you need and how often you need them. This information is not intended to replace advice given to you by your health care provider. Make sure you discuss any questions you have with your health care provider. Document Released: 07/08/2015 Document Revised: 02/29/2016 Document Reviewed: 04/12/2015 Elsevier Interactive Patient Education  2017 [place] Prevention in the Home Falls can cause injuries. They can happen to people of all ages. There are many things you can do to make your home safe and to help prevent falls. What can I do on the outside of my home?  Regularly fix the edges of walkways and driveways and fix any cracks.  Remove anything that might make you trip as you walk through a door, such as a raised step or threshold.  Trim any bushes or trees on the path to your home.  Use bright outdoor lighting.  Clear any walking paths of anything that might make someone trip, such as rocks or tools.  Regularly check to see if handrails are loose or broken. Make sure that both sides of any steps have handrails.  Any raised decks and porches should have guardrails on the edges.  Have any leaves, snow, or ice cleared regularly.  Use sand or salt on walking paths during winter.  Clean up any spills in your garage right away. This includes oil or grease spills. What can I do in the bathroom?  Use night lights.  Install grab bars by the toilet and in the tub and shower. Do not use towel bars as grab bars.  Use non-skid mats or decals in the tub or shower.  If you need to sit down in the shower, use a plastic, non-slip stool.  Keep the floor dry. Clean up any water that spills on the floor as soon as it happens.  Remove soap buildup in the tub or  shower regularly.  Attach bath mats securely with double-sided non-slip rug tape.  Do not have throw rugs and other things on the floor that can make you trip. What can I do in the bedroom?  Use night lights.  Make sure that you have a light by your bed that is easy to reach.  Do not use any sheets or blankets that are too big for your bed. They should not hang down onto the floor.  Have a firm chair that has side arms. You can use this for support while you get dressed.  Do not have throw rugs and other things on the floor that can make you trip. What can I do in the kitchen?  Clean up any spills right away.  Avoid walking on wet floors.  Keep items that you use a lot in easy-to-reach places.  If you need to reach something above you, use a strong step stool that has a grab bar.  Keep electrical cords out of the way.  Do not use floor polish or wax that makes floors slippery. If you must use wax, use non-skid floor wax.  Do not have throw rugs and other things on the floor that can make you trip. What can I do with my  stairs?  Do not leave any items on the stairs.  Make sure that there are handrails on both sides of the stairs and use them. Fix handrails that are broken or loose. Make sure that handrails are as long as the stairways.  Check any carpeting to make sure that it is firmly attached to the stairs. Fix any carpet that is loose or worn.  Avoid having throw rugs at the top or bottom of the stairs. If you do have throw rugs, attach them to the floor with carpet tape.  Make sure that you have a light switch at the top of the stairs and the bottom of the stairs. If you do not have them, ask someone to add them for you. What else can I do to help prevent falls?  Wear shoes that:  Do not have high heels.  Have rubber bottoms.  Are comfortable and fit you well.  Are closed at the toe. Do not wear sandals.  If you use a stepladder:  Make sure that it is fully  opened. Do not climb a closed stepladder.  Make sure that both sides of the stepladder are locked into place.  Ask someone to hold it for you, if possible.  Clearly mark and make sure that you can see:  Any grab bars or handrails.  First and last steps.  Where the edge of each step is.  Use tools that help you move around (mobility aids) if they are needed. These include:  Canes.  Walkers.  Scooters.  Crutches.  Turn on the lights when you go into a dark area. Replace any light bulbs as soon as they burn out.  Set up your furniture so you have a clear path. Avoid moving your furniture around.  If any of your floors are uneven, fix them.  If there are any pets around you, be aware of where they are.  Review your medicines with your doctor. Some medicines can make you feel dizzy. This can increase your chance of falling. Ask your doctor what other things that you can do to help prevent falls. This information is not intended to replace advice given to you by your health care provider. Make sure you discuss any questions you have with your health care provider. Document Released: 04/07/2009 Document Revised: 11/17/2015 Document Reviewed: 07/16/2014 Elsevier Interactive Patient Education  2017 [employer].

## 2020-01-07 ENCOUNTER — Other Ambulatory Visit: Payer: Self-pay

## 2020-01-07 ENCOUNTER — Ambulatory Visit: Payer: Medicare HMO | Admitting: Cardiology

## 2020-01-07 ENCOUNTER — Encounter: Payer: Self-pay | Admitting: Cardiology

## 2020-01-07 DIAGNOSIS — E785 Hyperlipidemia, unspecified: Secondary | ICD-10-CM

## 2020-01-07 DIAGNOSIS — I5032 Chronic diastolic (congestive) heart failure: Secondary | ICD-10-CM | POA: Diagnosis not present

## 2020-01-07 DIAGNOSIS — I272 Pulmonary hypertension, unspecified: Secondary | ICD-10-CM

## 2020-01-07 DIAGNOSIS — R55 Syncope and collapse: Secondary | ICD-10-CM | POA: Diagnosis not present

## 2020-01-07 DIAGNOSIS — Z6841 Body Mass Index (BMI) 40.0 and over, adult: Secondary | ICD-10-CM

## 2020-01-07 DIAGNOSIS — I110 Hypertensive heart disease with heart failure: Secondary | ICD-10-CM | POA: Diagnosis not present

## 2020-01-07 NOTE — Patient Instructions (Signed)
Medication Instructions:  No changes  *If you need a refill on your cardiac medications before your next appointment, please call your pharmacy*   Lab Work: Not needed If you have labs (blood work) drawn today and your tests are completely normal, you will receive your results only by: . MyChart Message (if you have MyChart) OR . A paper copy in the mail If you have any lab test that is abnormal or we need to change your treatment, we will call you to review the results.   Testing/Procedures:  Not needed  Follow-Up: At [facility], you and your health needs are our priority.  As part of our continuing mission to provide you with exceptional heart care, we have created designated Provider Care Teams.  These Care Teams include your primary Cardiologist (physician) and Advanced Practice Providers (APPs -  Physician Assistants and Nurse Practitioners) who all work together to provide you with the care you need, when you need it.   Your next appointment:   12 month(s)  The format for your next appointment:   In Person  Provider:   David Harding, MD  

## 2020-01-07 NOTE — Progress Notes (Signed)
Primary Care Provider: [name], MD Cardiologist: Glenetta Hew, MD  Advanced Heart Failure Cardiologist: Dr. Loralie Champagne Electrophysiologist: None  Clinic Note: Chief Complaint  Patient presents with  . Follow-up    Routine 6-67-month . Shortness of Breath    Obesity-OHS/OSA with mild-moderate P HTN in setting of morbid obesity    HPI:    CGWENETTE WELLONSis a 74y.o. female with a history of OSA/OHS-CPAP with RV dysfunction and hypertensive heart disease with HFpEF (being followed by Dr. MAundra Dubinfor right-sided heart failure) below who presents today for 831-montheneral cardiology follow-up.  CaLAYANNA CHAROas last seen on here on May 04, 2019 in follow-up from her right and left heart cath in October that essentially showed normal coronary arteries with mildly elevated EDP and mild pulmonary hypertension-mean PAP 31 mmHg and LVEDP of 15-18 mmHg. -->  She was treated with aggressive diuresis.  Hydralazine dose reduced to 70 mg twice daily and Lasix dose of 80 mg daily with 40 mg on Saturdays and Sundays-otherwise sliding scale.  Average weight estimated to 22 to 24 pounds -> was able to do 5 loops around the house without slowing down.  Significant improvement.  Noted poor balance.  No chest pain or pressure.   Well-controlled edema energy level down but better than before. ->  Reiterated importance of sliding scale Lasix. ->  Referred to ADVANCED HEART FAILURE TEAM (Dr. McAundra Dubinfor follow-up and establishment of care.  Recent Hospitalizations: None  She was just seen on December 17, 2019 by Dr. McAundra Dubinn the heart failure clinic.  Cardiomems placed on 5/21: RHC showed normal RA pressure and PCWP.  Moderate pulmonary tension but PVR not significant elevated.  PHtn thought to be related to high output.  No intracardiac shunt; --> was not thought to be hypovolemic  Maintained on 80 mg daily Lasix.  Using CPAP, but not using oxygen today.  Low resting O2 sat despite normal PFTs  in May 2021  Refer to GI medicine.  That she is   As is usually the case, CaCarleis accompanied by her sister -SaMarlowe Alt Reviewed  CV studies:    The following studies were reviewed today: (if available, images/films reviewed: From Epic Chart or Care Everywhere) . Cardiomems & RHC (11/13/19):  o RAP 7 mmHg, RVP 59/9 mmHg, PA P-mean 58/21 mmHg, 34 mmHg mean.  PCWP 12 mmHg.  CO 16, CI 8.3.  (Fick,) 7.68-3.95 (thermodilution)  TTE Nov 09, 2019: EF 60 to 65%.  Normal LV function.  No R WMA.  Mildly dilated LV cavity, indeterminate diastolic filling pressures.  RV severely enlarged with increased wall thickness.  Unable to assess PA pressures for TR due to envelope.  Mild aortic valve sclerosis but no stenosis.  Mild pulmonic stenosis.  RAP estimated 8 mmHg.--IVC dilated  VQ scan 08/16/2019 no chronic PE.  Interval History:   CaTIMYA TRIMMERs here today overall feeling pretty well..  She did have an episode of syncope back in May on the 21st.  Had a right heart cath showing moderate pulmonary pretension suspected related to high output.  Syncope was thought may be related to a seizure and not true cardiac syncope.  PAD P by Cardiomems 13-14 mmHg.  Noted that dyspnea comes and goes.  Okay with walking on flat ground.-Vertigo symptoms, she is also noting intermittent BR BPR -> referred to GI medicine.  She okay.  She sleeps propped up mostly for comfort but also because of  low shortness of breath.  She tries to walk as often as possible, sometimes gets up to 1-2 miles at a time.  She has been scared to do that since her most recent passout spell.  Probably the most notable time when she gets breathless early in the morning.  She does have resting dyspnea but worse with exertion.  No resting or exertional chest pain.  She has dizziness spells but it is worse with lying down as opposed to sitting up.  This would argue against orthopnea.  CV Review of Symptoms (Summary) Cardiovascular ROS:  positive for - dyspnea on exertion, edema, irregular heartbeat and Lightheadedness, dizziness with exertion negative for - chest pain, edema, loss of consciousness, orthopnea, palpitations, paroxysmal nocturnal dyspnea, rapid heart rate, shortness of breath or Syncope/near syncope, TIA/amaurosis fugax, claudication  The patient does not have symptoms concerning for COVID-19 infection (fever, chills, cough, or new shortness of breath).  The patient is practicing social distancing & Masking.    REVIEWED OF SYSTEMS   Review of Systems  Constitutional: Negative for malaise/fatigue (Not just not a lot of energy) and weight loss.  HENT: Negative for ear discharge.   Respiratory: Positive for shortness of breath. Negative for cough, sputum production and wheezing.   Cardiovascular: Positive for leg swelling (Stable).  Musculoskeletal: Positive for back pain, falls (Not sure if she fell the last time.), joint pain and myalgias.  Neurological: Positive for dizziness (Especially with increased exertion).  Psychiatric/Behavioral: Positive for memory loss. Negative for depression and suicidal ideas. The patient is not nervous/anxious.     I have reviewed and (if needed) personally updated the patient's problem list, medications, allergies, past medical and surgical history, social and family history.   PAST MEDICAL HISTORY   Past Medical History:  Diagnosis Date  . Abnormal liver function     in the past.  . Anemia   . Arthritis    all over  . Bruises easily   . Cataract    right eye;immature  . Chronic back pain    stenosis  . Chronic cough   . Chronic kidney disease   . COPD (chronic obstructive pulmonary disease) ([place])   . Demand myocardial infarction [facility]) 2012   Demand Infarction in setting of Pancreatitis --> mild Troponin elevation, NON-OBSTRUCTIVE CAD  . Depression    takes Abilify daily as well as Zoloft  . Diastolic heart failure 4010   Grade 1 diastolic Dysfunction by Echo     . Diverticulosis   . DM (diabetes mellitus) ([place])    takes Victoza daily as well as Lantus and Humalog  . Empty sella ([place])    on MRI in 2009.  . Glaucoma   . Headache(784.0)    last migraine-4-51yr ago  . History of blood transfusion    no abnormal reaction noted  . History of colon polyps    benign  . HTN (hypertension)    takes BKinder Morgan Energydaily  . Hyperlipidemia    takes Lipitor daily  . Joint swelling   . Nocturia   . Obesity hypoventilation syndrome ([place]   . Obstructive sleep apnea 02/2018   Notably improved split-night study with weight loss from 270 (2017) down to 250 pounds (2019).  . Pancreatitis    takes Pancrelipase daily  . Parkinson's disease ([place]    takes Sinemet daily  . Peripheral neuropathy   . Pneumonia 2012  . Seizures ([place]    takes Lamictal daily and Primidone nightly;last seizure 2wks ago  .  Urinary urgency    With increased frequency  . Varicose veins of both lower extremities with pain    With edema.  Takes daily Lasix    PAST SURGICAL HISTORY   Past Surgical History:  Procedure Laterality Date  . ABDOMINAL HYSTERECTOMY    . APPENDECTOMY    . BACK SURGERY    . Cardiac Event Monitor  September-October 2017   Sinus rhythm with occasional PACs and artifact. No arrhythmias besides one short run of tachycardia.  . CHOLECYSTECTOMY    . COLONOSCOPY N/A 08/15/2012   Procedure: COLONOSCOPY;  Surgeon: Arta Silence, MD;  Location: [facility];  Service: Endoscopy;  Laterality: N/A;  . COLONOSCOPY WITH PROPOFOL Left 06/17/2017   Procedure: COLONOSCOPY WITH PROPOFOL;  Surgeon: [name], MD;  Location: [facility];  Service: Gastroenterology;  Laterality: Left;  . CORONARY CALCIUM SCORE AND CTA  04/2018   Coronary calcium score 71.9.  Very large, hyperdynamic LAD wrapping apex giving rise to PDA.  Moderate LAD-diagonal and circumflex plaque -> CT FFR suggested positive findings in distal D1, D2 and distal circumflex.  Referred for  cath. ==> FALSE POSITIVE  . ESOPHAGOGASTRODUODENOSCOPY    . eye cysts Bilateral   . LASIK    . LEFT HEART CATH AND CORONARY ANGIOGRAPHY N/A 05/16/2018   Procedure: LEFT HEART CATH AND CORONARY ANGIOGRAPHY;  Surgeon: Belva Crome, MD;  Location: [facility] INVASIVE CV LAB:  Angiographically normal coronary arteries with LVEDP of 21 mmHg.  Large draping hyperdominant LAD that wraps the apex and provides distal half of the PDA.  Relatively small caliber distal Cx -->proxLPDA, non-dom RCA. No Cx or Diag lesions -- FALSE + CT FFR  . LEFT HEART CATH AND CORONARY ANGIOGRAPHY  2009/March 2012   2009: (Dr. Alvie Heidelberg) Nonobstructive CAD; 2012: Minimal CAD --> false positive stress test  . NM MYOVIEW LTD  01/2016; 01/2018   a)LOW RISK. No ischemia or infarction;; b) EF 55-60%. NO ST changes.  No ischemia or Infarction. NO significant RV enlargement.  LOW RISK.  Marland Kitchen Polysomnogram  02/2018   (Dr. Brett Fairy from neurology): Split study was not adequately done due to low AHI.  She slept reclined and had an AHI less than 10, prolonged hypoxemia and no hypercapnia. -->  She has home oxygen already prescribed, but did not meet criteria for nightly oxygen.  (Was noted that the patient did not cooperate well with study.  Titration was discussed.  Marland Kitchen PRESSURE SENSOR/CARDIOMEMS N/A 11/09/2019   Procedure: PRESSURE SENSOR/CARDIOMEMS;  Surgeon: Larey Dresser, MD;  Location: [facility] CV LAB;  Service: Cardiovascular;  Laterality: N/A;  . RECTAL POLYPECTOMY    . RIGHT/LEFT HEART CATH AND CORONARY ANGIOGRAPHY N/A 04/06/2019   Procedure: RIGHT/LEFT HEART CATH AND CORONARY ANGIOGRAPHY;  Surgeon: Leonie Man, MD;  Location: [facility] INVASIVE CV LAB; angiographically normal coronary arteries.  Moderately elevated AD LVEDP..  Mild pulmonary hypertension: PA P 56/14 mmHg-mean 31 mmHg.  RAP 10 mmHg.  RV P-EDP 54/4 mmHg - 12 mmHg.  PCWP 11-15 mmHg.  CO-CI: 7.35-3.73.  . TONSILLECTOMY    . TRANSTHORACIC ECHOCARDIOGRAM  01/2016; 05/2017   A)  Normal LV chamber size with mod LVH pattern. EF 50-55%. Severe LA dilation. Mod RA dilation. PAP elevated at 38 mmHg;; B) Mild concentric LVH.  EF 55-60% & no RWMA.  Grade 2 DD.  Diastolic flattening of the ventricular septum == ? elevated PAP.  Mild aortic valve calcification/sclerosis.  Mod LA dilation.  Mild RV dilation & Mod RA dilation  .  TRANSTHORACIC ECHOCARDIOGRAM  10/2017; 01/2018   a) EF 55-60%.  No RWMA,  Moderate LA dilation.  Mild LA dilation.  Peak PA pressures ~57 mmHg (moderate);;; b) EF 55-60%.  GRII DD.  Suggestion of RV volume overload with moderate RV dilation.  Severe LA and RA dilation.Marland Kitchen  PA pressure ~67%.  . TRANSTHORACIC ECHOCARDIOGRAM  01/2019   Normal LV size and function.  EF 50 to 55%.  Impaired relaxation.  RV appears to have moderately reduced function.  Severely enlarged.  Cannot fully assess RV pressures.  Hypermobile interatrial septum.  Right atrium severely dilated.  . TUBAL LIGATION    . vaginal cyst removed     several times    MEDICATIONS/ALLERGIES   Current Meds  Medication Sig  . Accu-Chek Softclix Lancets lancets Use as instructed to check blood sugars 2 times per day dx: e11.22  . acetaminophen (TYLENOL) 500 MG tablet Take 500-1,000 mg by mouth every 4 (four) hours as needed for moderate pain.   Marland Kitchen albuterol (VENTOLIN HFA) 108 (90 Base) MCG/ACT inhaler Inhale 1-2 puffs into the lungs every 6 (six) hours as needed for wheezing or shortness of breath.  Marland Kitchen aspirin 81 MG chewable tablet Chew 1 tablet (81 mg total) by mouth daily.  Marland Kitchen atorvastatin (LIPITOR) 10 MG tablet Take 1 tablet (10 mg total) by mouth every evening.  . B Complex-C (B-COMPLEX WITH VITAMIN C) tablet Take 1 tablet by mouth daily.  . B-D ULTRAFINE III SHORT PEN 31G X 8 MM MISC USE DAILY WITH VICTOZA AND/OR NOVOLOG.  . Blood Glucose Calibration (ACCU-CHEK AVIVA) SOLN Use as directed dx: e11.22  . Blood Glucose Monitoring Suppl (ACCU-CHEK AVIVA PLUS) w/Device KIT Use as directed to check blood  sugars 2 times per day dx: e11.22  . carvedilol (COREG) 25 MG tablet TAKE 1 TABLET BY MOUTH TWICE DAILY WITH MEALS  . Cholecalciferol (VITAMIN D-3) 1000 units CAPS Take 1,000 Units by mouth daily.  Marland Kitchen dexlansoprazole (DEXILANT) 60 MG capsule Take 1 capsule (60 mg total) by mouth daily.  . diclofenac sodium (VOLTAREN) 1 % GEL Apply 2 g topically 4 (four) times daily as needed (pain).  . furosemide (LASIX) 80 MG tablet Take 80 mg by mouth daily.   Marland Kitchen glucose blood (ACCU-CHEK AVIVA PLUS) test strip Use as instructed to check blood sugars 2 times per day dx: e11.22  . hydrocortisone cream 1 % Apply 1 application topically daily as needed for itching.  . insulin lispro (HUMALOG KWIKPEN) 100 UNIT/ML KwikPen Inject 0.02-0.04 mLs (2-4 Units total) into the skin 3 (three) times daily as needed (high blood sugar over 150).  . Isopropyl Alcohol (ALCOHOL WIPES) 70 % MISC Apply 1 each topically 2 (two) times daily.  . isosorbide mononitrate (IMDUR) 30 MG 24 hr tablet Take 1 tablet (30 mg total) by mouth daily.  Marland Kitchen lamoTRIgine (LAMICTAL) 150 MG tablet Take 1 tablet (150 mg total) by mouth 2 (two) times daily.  Marland Kitchen levETIRAcetam (KEPPRA) 500 MG tablet 2 tablets at bedtime  . Menthol, Topical Analgesic, (STOPAIN ROLL-ON EX) Apply 1 application topically daily as needed (pain).  . Multiple Vitamins-Minerals (MULTIVITAMIN WITH MINERALS) tablet Take 1 tablet by mouth daily.  Marland Kitchen neomycin-bacitracin-polymyxin (NEOSPORIN) ointment Apply 1 application topically as needed for wound care.   Marland Kitchen OZEMPIC, 1 MG/DOSE, 2 MG/1.5ML SOPN INJECT 1MG INTO THE SKIN ONCE A WEEK (Patient taking differently: Inject 1 mg into the muscle once a week. Thursday)  . Pancrelipase, Lip-Prot-Amyl, (ZENPEP) 25000-79000 units CPEP Take 1-2 capsules by mouth  See admin instructions. Take 2 capsules in the morning, 1 capsule with lunch, and 2 capsules in the evening.  . polyethylene glycol (MIRALAX / GLYCOLAX) 17 g packet Take 17 g by mouth daily as needed  for moderate constipation or severe constipation.  . potassium chloride SA (KLOR-CON) 20 MEQ tablet TAKE 2 TABLETS EVERY DAY BUT WHEN TAKING METOLAZONE TAKE 3 TABLETS DAILY AS DIRECTED  . sertraline (ZOLOFT) 50 MG tablet Take 1 tablet (50 mg total) by mouth daily.  Marland Kitchen tolnaftate (TINACTIN) 1 % cream Apply 1 application topically daily as needed (foot fungus).   [name] SOLOSTAR 300 UNIT/ML Solostar Pen INJECT 36 UNITS INTO THE SKIN AT BEDTIME. (Patient taking differently: 34 Units. )    Allergies  Allergen Reactions  . Other Anaphylaxis and Rash    Bleach  . Penicillins Hives    Did it involve swelling of the face/tongue/throat, SOB, or low BP? No Did it involve sudden or severe rash/hives, skin peeling, or any reaction on the inside of your mouth or nose? Yes Did you need to seek medical attention at a hospital or doctor's office? Yes When did it last happen?Childhood allergy  If all above answers are "NO", may proceed with cephalosporin use.    . Sulfa Antibiotics Hives  . Aspirin Other (See Comments)     On aspirin 81 mg - Rectal bleeding in dec 2018  . Codeine     Headache and makes the patient feel "off"    SOCIAL HISTORY/FAMILY HISTORY   Reviewed in Epic:  Pertinent findings: Accompanied by her sister [name] Pressley-patient information updated  OBJCTIVE -PE, EKG, labs   Wt Readings from Last 3 Encounters:  01/07/20 219 lb 12.8 oz (99.7 kg)  01/06/20 215 lb 12.8 oz (97.9 kg)  12/21/19 214 lb 12.8 oz (97.4 kg)    Physical Exam: BP 111/64   Pulse 63   Temp (!) 97.2 F (36.2 C)   Ht 5' 1"  (1.549 m)   Wt 219 lb 12.8 oz (99.7 kg)   SpO2 98%   BMI 41.53 kg/m  Physical Exam Vitals reviewed.  Constitutional:      General: She is not in acute distress.    Appearance: Normal appearance. She is obese. She is not ill-appearing.  HENT:     Head: Normocephalic and atraumatic.  Eyes:     Extraocular Movements: Extraocular movements intact.  Neck:     Vascular:  JVD (JVP ~ 7-8 cmH20 (difficult to assess)) present. No carotid bruit.  Cardiovascular:     Rate and Rhythm: Normal rate and regular rhythm. Occasional extrasystoles are present.    Pulses: Decreased pulses (Due to body habitus).     Heart sounds: S1 normal and S2 normal. Heart sounds are distant. Murmur (2/6 early SEM at RUSB) heard.  No friction rub. No gallop (Difficult to hear because of distant heart sounds).   Pulmonary:     Effort: Pulmonary effort is normal. No respiratory distress.     Breath sounds: No rhonchi.     Comments: Distant breath sounds Abdominal:     General: Abdomen is flat. Bowel sounds are normal. There is no distension.     Palpations: There is no mass.     Tenderness: There is abdominal tenderness (Diffuse).     Hernia: No hernia is present.     Comments: Obese.  Unable to assess HSM  Musculoskeletal:        General: Deformity (Significant Pez valgus) present. No swelling, tenderness or signs  of injury.     Cervical back: Edema (trace - 1+ BLE) present.  Neurological:     General: No focal deficit present.     Mental Status: She is alert and oriented to person, place, and time.  Psychiatric:        Mood and Affect: Mood normal.        Behavior: Behavior normal.        Judgment: Judgment normal.     Comments: She seems a little slow.  More definite shown to her sister than usual.      Adult ECG Report Not checked  Recent Labs:     Lab Results  Component Value Date   CHOL 150 12/17/2019   HDL 76 12/17/2019   LDLCALC 65 12/17/2019   LDLDIRECT 66.0 01/27/2015   TRIG 47 12/17/2019   CHOLHDL 2.0 12/17/2019   Lab Results  Component Value Date   CREATININE 1.04 (H) 12/03/2019   BUN 16 12/03/2019   NA 135 12/03/2019   K 3.5 12/03/2019   CL 99 12/03/2019   CO2 26 12/03/2019   Lab Results  Component Value Date   TSH 1.500 11/09/2019    ASSESSMENT/PLAN   Overall Koryn is doing fairly well.  She was just seen by Dr. [name] in the heart failure  clinic and doing well at that time.  No major changes.  She has some upcoming studies planned which we will look out for.  Have essentially continue current meds from Dr. [name].  We will follow up as scheduled.  Problem List Items Addressed This Visit    Morbid obesity with BMI of 45.0-49.9, adult ([place]) (Chronic)    She is starting to walk now, but is really limited by her leg/foot issues making it very hard for her to walk with poor balance.      Pulmonary hypertension, unspecified ([place]) (Chronic)    By recent Thought to be related to high output cardiac function. No current role for inodilators.  Is on beta-blocker and hydralazine along with diuretic.  No calcium channel blocker or inodilators   Continue to use CPAP with a nighttime oxygen.  Consider using daytime oxygen.      Hypertensive heart disease with chronic diastolic congestive heart failure (HCC) (Chronic)    Blood pressure seems to be doing well at 111/64.  She is on max dose carvedilol moderate dose hydralazine 25 3 times daily plus Imdur.  Not on ACE inhibitor or ARB because of prior renal insufficiency.  Predialysis pressures are stable, we can continue with ARB but in the future may be reasonable. Seems again euvolemic on exam she is taking 80 mg daily Lasix with sliding scale, but is no longer on as needed Zaroxolyn.      Dyslipidemia, goal LDL below 100 (Chronic)    Very well controlled on low-dose atorvastatin      Chronic diastolic CHF (congestive heart failure), NYHA class 2 (HCC) (Chronic)    On stable meds.  Not on ARB or ACE inhibitor but is on hydralazine nitrate.  On high-dose carvedilol.  Also being followed by Dr. [name].  We will continue current dosing of Lasix with sliding scale.      Near syncope    Episode that she had was more consistent with seizure.  She has not lost any consciousness recently in the last week or 2.          COVID-19 Education: The signs and symptoms of COVID-19 were  discussed with the patient and  how to seek care for testing (follow up with PCP or arrange E-visit).   The importance of social distancing and COVID-19 vaccination was discussed today.  I spent a total of 68mnutes with the patient. >  50% of the time was spent in direct patient consultation.  Additional time spent with chart review  / charting (studies, outside notes, etc): 8 Total Time: 34 min   Current medicines are reviewed at length with the patient today.  (+/- concerns) no major issues  Notice: This dictation was prepared with Dragon dictation along with smaller phrase technology. Any transcriptional errors that result from this process are unintentional and may not be corrected upon review.  Patient Instructions / Medication Changes & Studies & Tests Ordered   Patient Instructions  Medication Instructions:  No changes *If you need a refill on your cardiac medications before your next appointment, please call your pharmacy*   Lab Work: Not needed If you have labs (blood work) drawn today and your tests are completely normal, you will receive your results only by: .Marland KitchenMyChart Message (if you have MyChart) OR . A paper copy in the mail If you have any lab test that is abnormal or we need to change your treatment, we will call you to review the results.   Testing/Procedures: Not needed   Follow-Up: [facility] you and your health needs are our priority.  As part of our continuing mission to provide you with exceptional heart care, we have created designated Provider Care Teams.  These Care Teams include your primary Cardiologist (physician) and Advanced Practice Providers (APPs -  Physician Assistants and Nurse Practitioners) who all work together to provide you with the care you need, when you need it.    Your next appointment:   12 month(s)  The format for your next appointment:   In Person  Provider:   DGlenetta Hew MD    She will [facility].   Studies Ordered:   No orders of the defined types were placed in this encounter.    DGlenetta Hew M.D., M.S. Interventional Cardiologist   Pager # [phone] # [phone] [place] [address]  [facility]

## 2020-01-08 ENCOUNTER — Telehealth: Payer: Self-pay

## 2020-01-10 NOTE — Procedures (Signed)
ELECTROENCEPHALOGRAM REPORT  Dates of Recording: 12/30/2019 9:27AM to 01/01/2020 10:05AM  Patient's Name: [name] MRN: [id number] Date of Birth: Sep 17, 1945  Referring Provider: Dr. Ellouise Newer  Procedure: 48-hour ambulatory video EEG  History: This is a 74 year old woman with co-existing epilepsy and psychogenic non-epileptic events with an increase in seizure frequency, EEG for classification  Seizure Medications:  Lamictal Keppra  Technical Summary: This is a 48-hour multichannel digital video EEG recording measured by the international 10-20 system with electrodes applied with paste and impedances below 5000 ohms performed as portable with EKG monitoring.  The digital EEG was referentially recorded, reformatted, and digitally filtered in a variety of bipolar and referential montages for optimal display.    DESCRIPTION OF RECORDING: During maximal wakefulness, the background activity consisted of a symmetric 10 Hz posterior dominant rhythm which was reactive to eye opening.  There were no epileptiform discharges or focal slowing seen in wakefulness.  During the recording, the patient progresses through wakefulness, drowsiness, and Stage 2 sleep. During drowsiness and sleep, there is an increase in theta slowing, at times sharply contoured over the left temporal region without clear epileptogenic potential.  Again, there were no epileptiform discharges seen.  Events: On 7/7 at 1330 hours, she feels short of breath during PT. Not on video. Electrographically, there were no EEG or EKG changes seen.  On 7/7 at 2245 hours, she feels short of breath. Patient walking with walker, no clear clinical changes seen. Electrographically, there were no EEG or EKG changes seen.  On 7/8 at 1100 hours, she has nausea. Patient changing clothes on video, no clinical changes seen. Electrographically, there were no EEG or EKG changes seen.  On 7/8 at 1345 hours, she feels dizzy, chest pain. Not on  video. Electrographically, there were no EEG or EKG changes seen.  On 7/9 at 0740 hours, she feels shaky. Not on video. Electrographically, there were no EEG or EKG changes seen.  There were no electrographic seizures seen.  EKG lead showed sinus bradycardia at 60 bpm with occasional extrasystolic beats.   IMPRESSION: This 48-hour ambulatory video EEG study is normal.    CLINICAL CORRELATION: A normal EEG does not exclude a clinical diagnosis of epilepsy. Typical events were not captured. Episodes of shortness of breath, nausea, dizziness, chest pain, feeling shaky, did not show EEG correlate. If further clinical questions remain, inpatient video EEG monitoring may be helpful.   Ellouise Newer, M.D.

## 2020-01-11 LAB — CBC WITH DIFFERENTIAL/PLATELET
Basophils Absolute: 0 10*3/uL (ref 0.0–0.2)
Basos: 1 %
EOS (ABSOLUTE): 0.1 10*3/uL (ref 0.0–0.4)
Eos: 3 %
Hematocrit: 30.2 % — ABNORMAL LOW (ref 34.0–46.6)
Hemoglobin: 9.4 g/dL — ABNORMAL LOW (ref 11.1–15.9)
Immature Grans (Abs): 0 10*3/uL (ref 0.0–0.1)
Immature Granulocytes: 0 %
Lymphocytes Absolute: 1.2 10*3/uL (ref 0.7–3.1)
Lymphs: 28 %
MCH: 22.8 pg — ABNORMAL LOW (ref 26.6–33.0)
MCHC: 31.1 g/dL — ABNORMAL LOW (ref 31.5–35.7)
MCV: 73 fL — ABNORMAL LOW (ref 79–97)
Monocytes Absolute: 0.5 10*3/uL (ref 0.1–0.9)
Monocytes: 11 %
Neutrophils Absolute: 2.3 10*3/uL (ref 1.4–7.0)
Neutrophils: 57 %
Platelets: 316 10*3/uL (ref 150–450)
RBC: 4.13 x10E6/uL (ref 3.77–5.28)
RDW: 16 % — ABNORMAL HIGH (ref 11.7–15.4)
WBC: 4.2 10*3/uL (ref 3.4–10.8)

## 2020-01-11 LAB — C-REACTIVE PROTEIN: CRP: <1 mg/L (ref 0–10)

## 2020-01-11 LAB — FANA STAINING PATTERNS: Homogeneous Pattern: 1:320 {titer} — ABNORMAL HIGH

## 2020-01-11 LAB — URIC ACID: Uric Acid: 5.4 mg/dL (ref 3.1–7.9)

## 2020-01-11 LAB — HLA-B27 ANTIGEN: HLA B27: NEGATIVE

## 2020-01-11 LAB — ANTINUCLEAR ANTIBODIES, IFA: ANA Titer 1: POSITIVE — AB

## 2020-01-11 LAB — RHEUMATOID FACTOR: Rheumatoid fact SerPl-aCnc: 10.8 IU/mL (ref 0.0–13.9)

## 2020-01-12 ENCOUNTER — Telehealth: Payer: Self-pay

## 2020-01-12 DIAGNOSIS — I130 Hypertensive heart and chronic kidney disease with heart failure and stage 1 through stage 4 chronic kidney disease, or unspecified chronic kidney disease: Secondary | ICD-10-CM | POA: Diagnosis not present

## 2020-01-12 DIAGNOSIS — J9611 Chronic respiratory failure with hypoxia: Secondary | ICD-10-CM | POA: Diagnosis not present

## 2020-01-12 DIAGNOSIS — J449 Chronic obstructive pulmonary disease, unspecified: Secondary | ICD-10-CM | POA: Diagnosis not present

## 2020-01-12 DIAGNOSIS — E1122 Type 2 diabetes mellitus with diabetic chronic kidney disease: Secondary | ICD-10-CM | POA: Diagnosis not present

## 2020-01-12 DIAGNOSIS — I5032 Chronic diastolic (congestive) heart failure: Secondary | ICD-10-CM | POA: Diagnosis not present

## 2020-01-12 DIAGNOSIS — N182 Chronic kidney disease, stage 2 (mild): Secondary | ICD-10-CM | POA: Diagnosis not present

## 2020-01-12 DIAGNOSIS — G40909 Epilepsy, unspecified, not intractable, without status epilepticus: Secondary | ICD-10-CM | POA: Diagnosis not present

## 2020-01-12 DIAGNOSIS — I5082 Biventricular heart failure: Secondary | ICD-10-CM | POA: Diagnosis not present

## 2020-01-12 DIAGNOSIS — D631 Anemia in chronic kidney disease: Secondary | ICD-10-CM | POA: Diagnosis not present

## 2020-01-12 NOTE — Telephone Encounter (Signed)
-----   Message from Cameron Sprang, MD sent at 01/12/2020 10:11 AM EDT ----- Pls let patient/sister know that the brain wave test was normal. At this point, would continue on Lamotrigine 150mg  twice a day and make sure she is taking the Keppra 500mg  2 tabs qhs. Pls have sister check behind on medications, thanks

## 2020-01-12 NOTE — Telephone Encounter (Signed)
Pt called no answer unable to leave a voice mail  

## 2020-01-13 ENCOUNTER — Telehealth: Payer: Self-pay

## 2020-01-13 NOTE — Telephone Encounter (Signed)
Pt called no answer unable to leave voice mail

## 2020-01-13 NOTE — Telephone Encounter (Signed)
-----   Message from Cameron Sprang, MD sent at 01/12/2020 10:11 AM EDT ----- Pls let patient/sister know that the brain wave test was normal. At this point, would continue on Lamotrigine 150mg  twice a day and make sure she is taking the Keppra 500mg  2 tabs qhs. Pls have sister check behind on medications, thanks

## 2020-01-14 ENCOUNTER — Telehealth: Payer: Self-pay

## 2020-01-14 NOTE — Telephone Encounter (Signed)
-----   Message from Cameron Sprang, MD sent at 01/12/2020 10:11 AM EDT ----- Pls let patient/sister know that the brain wave test was normal. At this point, would continue on Lamotrigine 150mg  twice a day and make sure she is taking the Keppra 500mg  2 tabs qhs. Pls have sister check behind on medications, thanks

## 2020-01-14 NOTE — Telephone Encounter (Signed)
Pt called no answer unable to leave a voice mail will send a mychart message

## 2020-01-15 ENCOUNTER — Telehealth: Payer: Self-pay | Admitting: *Deleted

## 2020-01-15 ENCOUNTER — Encounter: Payer: Self-pay | Admitting: Cardiology

## 2020-01-15 DIAGNOSIS — M779 Enthesopathy, unspecified: Secondary | ICD-10-CM

## 2020-01-15 DIAGNOSIS — M069 Rheumatoid arthritis, unspecified: Secondary | ICD-10-CM

## 2020-01-15 DIAGNOSIS — M2141 Flat foot [pes planus] (acquired), right foot: Secondary | ICD-10-CM

## 2020-01-15 NOTE — Assessment & Plan Note (Signed)
Very well controlled on low-dose atorvastatin

## 2020-01-15 NOTE — Telephone Encounter (Signed)
-----   Message from Landis Martins, [place] sent at 01/11/2020  1:38 PM EDT ----- ANA + please let patient know and if she is agreeable refer her to Rheumatology for further evaluation Thanks -Dr. Chauncey Cruel

## 2020-01-15 NOTE — Assessment & Plan Note (Signed)
On stable meds.  Not on ARB or ACE inhibitor but is on hydralazine nitrate.  On high-dose carvedilol.  Also being followed by Dr. [name].  We will continue current dosing of Lasix with sliding scale.

## 2020-01-15 NOTE — Assessment & Plan Note (Addendum)
By recent Thought to be related to high output cardiac function. No current role for inodilators.  Is on beta-blocker and hydralazine along with diuretic.  No calcium channel blocker or inodilators   Continue to use CPAP with a nighttime oxygen.  Consider using daytime oxygen.

## 2020-01-15 NOTE — Assessment & Plan Note (Signed)
Never did really figure out what the syncopal episode was.  She has had some episodes like this but they are thought to be related to seizure activity.  Has had maybe 1 spell without loss of consciousness since hospital stay.  Does not sound arrhythmia genic.

## 2020-01-15 NOTE — Assessment & Plan Note (Signed)
Blood pressure seems to be doing well at 111/64.  She is on max dose carvedilol moderate dose hydralazine 25 3 times daily plus Imdur.  Not on ACE inhibitor or ARB because of prior renal insufficiency.  Predialysis pressures are stable, we can continue with ARB but in the future may be reasonable. Seems again euvolemic on exam she is taking 80 mg daily Lasix with sliding scale, but is no longer on as needed Zaroxolyn.

## 2020-01-15 NOTE — Assessment & Plan Note (Signed)
Episode that she had was more consistent with seizure.  She has not lost any consciousness recently in the last week or 2.

## 2020-01-15 NOTE — Telephone Encounter (Signed)
I informed pt and her POA/sister, [name] of Dr. Leeanne Rio review of results and referral. They agreed to the referral to Dr. Gavin Pound. Faxed required form, clinicals and demographics to [facility].

## 2020-01-15 NOTE — Assessment & Plan Note (Signed)
She is starting to walk now, but is really limited by her leg/foot issues making it very hard for her to walk with poor balance.

## 2020-01-22 NOTE — Telephone Encounter (Signed)
[facility] requested Rheumatoid labs. I faxed labs of 01/06/2020 results to [facility].

## 2020-01-25 ENCOUNTER — Telehealth: Payer: Self-pay

## 2020-01-25 NOTE — Telephone Encounter (Signed)
I left the pt a message that her samples of Dexilant is ready for pickup.

## 2020-01-28 ENCOUNTER — Emergency Department (HOSPITAL_COMMUNITY)
Admission: EM | Admit: 2020-01-28 | Discharge: 2020-01-29 | Disposition: A | Discharge status: Home / Self Care | Payer: Medicare HMO | Attending: Emergency Medicine | Admitting: Emergency Medicine

## 2020-01-28 ENCOUNTER — Emergency Department (HOSPITAL_COMMUNITY): Payer: Medicare HMO

## 2020-01-28 ENCOUNTER — Other Ambulatory Visit: Payer: Self-pay

## 2020-01-28 DIAGNOSIS — Z794 Long term (current) use of insulin: Secondary | ICD-10-CM | POA: Insufficient documentation

## 2020-01-28 DIAGNOSIS — I130 Hypertensive heart and chronic kidney disease with heart failure and stage 1 through stage 4 chronic kidney disease, or unspecified chronic kidney disease: Secondary | ICD-10-CM | POA: Diagnosis not present

## 2020-01-28 DIAGNOSIS — E161 Other hypoglycemia: Secondary | ICD-10-CM | POA: Diagnosis not present

## 2020-01-28 DIAGNOSIS — E1140 Type 2 diabetes mellitus with diabetic neuropathy, unspecified: Secondary | ICD-10-CM | POA: Insufficient documentation

## 2020-01-28 DIAGNOSIS — G319 Degenerative disease of nervous system, unspecified: Secondary | ICD-10-CM | POA: Diagnosis not present

## 2020-01-28 DIAGNOSIS — I6389 Other cerebral infarction: Secondary | ICD-10-CM | POA: Diagnosis not present

## 2020-01-28 DIAGNOSIS — G20 Parkinson's disease: Secondary | ICD-10-CM | POA: Insufficient documentation

## 2020-01-28 DIAGNOSIS — R569 Unspecified convulsions: Secondary | ICD-10-CM | POA: Diagnosis not present

## 2020-01-28 DIAGNOSIS — I252 Old myocardial infarction: Secondary | ICD-10-CM | POA: Diagnosis not present

## 2020-01-28 DIAGNOSIS — J449 Chronic obstructive pulmonary disease, unspecified: Secondary | ICD-10-CM | POA: Diagnosis not present

## 2020-01-28 DIAGNOSIS — E1122 Type 2 diabetes mellitus with diabetic chronic kidney disease: Secondary | ICD-10-CM | POA: Diagnosis not present

## 2020-01-28 DIAGNOSIS — Z87891 Personal history of nicotine dependence: Secondary | ICD-10-CM | POA: Insufficient documentation

## 2020-01-28 DIAGNOSIS — J3489 Other specified disorders of nose and nasal sinuses: Secondary | ICD-10-CM | POA: Diagnosis not present

## 2020-01-28 DIAGNOSIS — Z7982 Long term (current) use of aspirin: Secondary | ICD-10-CM | POA: Diagnosis not present

## 2020-01-28 DIAGNOSIS — E162 Hypoglycemia, unspecified: Secondary | ICD-10-CM | POA: Diagnosis not present

## 2020-01-28 DIAGNOSIS — I5032 Chronic diastolic (congestive) heart failure: Secondary | ICD-10-CM | POA: Diagnosis not present

## 2020-01-28 DIAGNOSIS — R404 Transient alteration of awareness: Secondary | ICD-10-CM | POA: Diagnosis not present

## 2020-01-28 DIAGNOSIS — Z79899 Other long term (current) drug therapy: Secondary | ICD-10-CM | POA: Diagnosis not present

## 2020-01-28 DIAGNOSIS — N182 Chronic kidney disease, stage 2 (mild): Secondary | ICD-10-CM | POA: Diagnosis not present

## 2020-01-28 DIAGNOSIS — I6782 Cerebral ischemia: Secondary | ICD-10-CM | POA: Diagnosis not present

## 2020-01-28 DIAGNOSIS — I10 Essential (primary) hypertension: Secondary | ICD-10-CM | POA: Diagnosis not present

## 2020-01-28 DIAGNOSIS — G40909 Epilepsy, unspecified, not intractable, without status epilepticus: Secondary | ICD-10-CM | POA: Diagnosis not present

## 2020-01-28 LAB — BASIC METABOLIC PANEL
Anion gap: 11 (ref 5–15)
BUN: 14 mg/dL (ref 8–23)
CO2: 26 mmol/L (ref 22–32)
Calcium: 9.2 mg/dL (ref 8.9–10.3)
Chloride: 101 mmol/L (ref 98–111)
Creatinine, Ser: 0.94 mg/dL (ref 0.44–1.00)
GFR calc Af Amer: >60 mL/min (ref >60)
GFR calc non Af Amer: >60 mL/min (ref >60)
Glucose, Bld: 72 mg/dL (ref 70–99)
Potassium: 3.6 mmol/L (ref 3.5–5.1)
Sodium: 138 mmol/L (ref 135–145)

## 2020-01-28 LAB — URINALYSIS, ROUTINE W REFLEX MICROSCOPIC
Bilirubin Urine: NEGATIVE
Glucose, UA: NEGATIVE mg/dL
Hgb urine dipstick: NEGATIVE
Ketones, ur: NEGATIVE mg/dL
Leukocytes,Ua: NEGATIVE
Nitrite: NEGATIVE
Protein, ur: NEGATIVE mg/dL
Specific Gravity, Urine: 1.005 (ref 1.005–1.030)
pH: 7 (ref 5.0–8.0)

## 2020-01-28 LAB — CBC WITH DIFFERENTIAL/PLATELET
Abs Immature Granulocytes: 0.01 10*3/uL (ref 0.00–0.07)
Basophils Absolute: 0.1 10*3/uL (ref 0.0–0.1)
Basophils Relative: 1 %
Eosinophils Absolute: 0.1 10*3/uL (ref 0.0–0.5)
Eosinophils Relative: 3 %
HCT: 30.7 % — ABNORMAL LOW (ref 36.0–46.0)
Hemoglobin: 9.1 g/dL — ABNORMAL LOW (ref 12.0–15.0)
Immature Granulocytes: 0 %
Lymphocytes Relative: 30 %
Lymphs Abs: 1.3 10*3/uL (ref 0.7–4.0)
MCH: 21.9 pg — ABNORMAL LOW (ref 26.0–34.0)
MCHC: 29.6 g/dL — ABNORMAL LOW (ref 30.0–36.0)
MCV: 73.8 fL — ABNORMAL LOW (ref 80.0–100.0)
Monocytes Absolute: 0.6 10*3/uL (ref 0.1–1.0)
Monocytes Relative: 14 %
Neutro Abs: 2.3 10*3/uL (ref 1.7–7.7)
Neutrophils Relative %: 52 %
Platelets: 305 10*3/uL (ref 150–400)
RBC: 4.16 MIL/uL (ref 3.87–5.11)
RDW: 16.4 % — ABNORMAL HIGH (ref 11.5–15.5)
WBC: 4.4 10*3/uL (ref 4.0–10.5)
nRBC: 0 % (ref 0.0–0.2)

## 2020-01-28 MED ORDER — LAMOTRIGINE 150 MG PO TABS
150.0000 mg | ORAL_TABLET | Freq: Once | ORAL | Status: AC
  Administered 2020-01-28: 150 mg via ORAL
  Filled 2020-01-28: qty 1

## 2020-01-28 MED ORDER — LEVETIRACETAM 500 MG PO TABS
1000.0000 mg | ORAL_TABLET | Freq: Once | ORAL | Status: AC
  Administered 2020-01-28: 1000 mg via ORAL
  Filled 2020-01-28: qty 2

## 2020-01-28 NOTE — ED Provider Notes (Signed)
[facility] EMERGENCY DEPARTMENT Provider Note   CSN: [id number] Arrival date & time: 01/28/20  1922     History Chief Complaint  Patient presents with  . Weakness  . Seizures    ELSE HABERMANN is a 74 y.o. female.  HPI   39yF with generalized weakness. Pt has a hx of seizures. Had two earlier today. Last one reportedly about a month ago. On keppra and reports compliance. Somewhat slow to respond to questioning but answers appropriate. Denies any acute pain. Felt ok when she woke up this morning. No fever. Hx of CVA with some residual weakness on L. Denies acute neuro deficits.   Past Medical History:  Diagnosis Date  . Abnormal liver function     in the past.  . Anemia   . Arthritis    all over  . Bruises easily   . Cataract    right eye;immature  . Chronic back pain    stenosis  . Chronic cough   . Chronic kidney disease   . COPD (chronic obstructive pulmonary disease) ([place])   . Demand myocardial infarction [facility]) 2012   Demand Infarction in setting of Pancreatitis --> mild Troponin elevation, NON-OBSTRUCTIVE CAD  . Depression    takes Abilify daily as well as Zoloft  . Diastolic heart failure 9562   Grade 1 diastolic Dysfunction by Echo   . Diverticulosis   . DM (diabetes mellitus) ([place])    takes Victoza daily as well as Lantus and Humalog  . Empty sella ([place])    on MRI in 2009.  . Glaucoma   . Headache(784.0)    last migraine-4-71yr ago  . History of blood transfusion    no abnormal reaction noted  . History of colon polyps    benign  . HTN (hypertension)    takes BKinder Morgan Energydaily  . Hyperlipidemia    takes Lipitor daily  . Joint swelling   . Nocturia   . Obesity hypoventilation syndrome ([place]   . Obstructive sleep apnea 02/2018   Notably improved split-night study with weight loss from 270 (2017) down to 250 pounds (2019).  . Pancreatitis    takes Pancrelipase daily  . Parkinson's disease ([place]    takes Sinemet  daily  . Peripheral neuropathy   . Pneumonia 2012  . Seizures ([place]    takes Lamictal daily and Primidone nightly;last seizure 2wks ago  . Urinary urgency    With increased frequency  . Varicose veins of both lower extremities with pain    With edema.  Takes daily Lasix   Patient Active Problem List   Diagnosis Date Noted  . Chronic kidney disease, stage 2 (mild) 01/05/2020  . Hypertensive chronic kidney disease with stage 1 through stage 4 chronic kidney disease, or unspecified chronic kidney disease 01/05/2020  . Snoring 01/05/2020  . Neuroleptic-induced Parkinsonism ([place] 09/28/2019  . Class 3 severe obesity due to excess calories with serious comorbidity and body mass index (BMI) of 40.0 to 44.9 in adult ([facility] 05/22/2019  . Pulmonary hypertension, unspecified ([place] 04/06/2019  . Syncope 02/24/2019  . Rheumatoid arthritis ([place] 01/21/2019  . Radial styloid tenosynovitis 11/24/2018  . Type 2 diabetes mellitus with stage 2 chronic kidney disease, with long-term current use of insulin ([place] 07/21/2018  . Chest pain 05/15/2018  . 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 Excessive daytime sleepiness 01/30/2018  . Sleep-related hypoventilation 01/30/2018  . Recurrent depression ([place] 11/06/2017  .  OSA on CPAP 10/07/2017  . Todd's paralysis ([place])   . Chronic diastolic CHF (congestive heart failure), NYHA class 2 ([place])   . Chronic respiratory failure with hypoxia ([place])   . Acute lower GI bleeding   . Stroke-like symptoms 06/15/2017  . Rectal bleeding 06/15/2017  . Dehydration 03/07/2017  . Medication management 12/24/2016  . Near syncope 03/09/2016  . Racing heart beat 03/09/2016  . Spinal stenosis at L4-L5 level 10/04/2014  . Parkinsonism ([place]) 06/24/2013  . Lower abdominal pain 10/31/2012  . Type II diabetes mellitus, uncontrolled ([place]) 10/31/2012  . Seizure disorder ([place]) 10/31/2012  . Diverticulosis 10/31/2012  .  Anemia associated with acute blood loss 08/14/2012  . Hematochezia 08/14/2012  . Varicose veins of bilateral lower extremities with other complications 33/00/7622  . Leg pain 03/21/2012  . Radicular pain of left lower extremity 03/21/2012  . Heart murmur 11/21/2011  . Infected pseudocyst of pancreas 10/28/2011  . Chronic pancreatitis ([place]) 10/28/2011  . Hypertensive heart disease with chronic diastolic congestive heart failure ([place]) 10/28/2011  . Dyslipidemia, goal LDL below 100 10/28/2011  . Morbid obesity with BMI of 45.0-49.9, adult ([place]) 10/28/2011  . Pseudoseizure 10/24/2011  . Gastroesophageal reflux 10/23/2011  . History of tobacco abuse 10/23/2011  . Hypoxemia 10/23/2011  . Disturbance in sleep behavior 09/03/2011  . Partial epilepsy with impairment of consciousness ([place]) 09/03/2011  . Tremor 09/03/2011  . Peripheral edema 04/14/2011  . Obesity hypoventilation syndrome ([place]) 10/26/2010  . Atherosclerotic heart disease of native coronary artery with other forms of angina pectoris ([place]) 08/28/2010  . Acute, but ill-defined, cerebrovascular disease 05/04/2008  . Arthropathy 05/04/2008  . Disequilibrium 05/04/2008  . Generalized tonic clonic epilepsy ([place]) 05/04/2008  . Increased frequency of urination 05/04/2008  . Insomnia, unspecified 05/04/2008  . Major depressive disorder, single episode, unspecified 05/04/2008  . Memory loss 05/04/2008  . Nausea 05/04/2008  . Other malaise and fatigue 05/04/2008  . Swelling of limb 05/04/2008    Past Surgical History:  Procedure Laterality Date  . ABDOMINAL HYSTERECTOMY    . APPENDECTOMY    . BACK SURGERY    . Cardiac Event Monitor  September-October 2017   Sinus rhythm with occasional PACs and artifact. No arrhythmias besides one short run of tachycardia.  . CHOLECYSTECTOMY    . COLONOSCOPY N/A 08/15/2012   Procedure: COLONOSCOPY;  Surgeon: Arta Silence, MD;  Location: [facility];  Service: Endoscopy;  Laterality: N/A;  .  COLONOSCOPY WITH PROPOFOL Left 06/17/2017   Procedure: COLONOSCOPY WITH PROPOFOL;  Surgeon: [name], MD;  Location: [facility];  Service: Gastroenterology;  Laterality: Left;  . CORONARY CALCIUM SCORE AND CTA  04/2018   Coronary calcium score 71.9.  Very large, hyperdynamic LAD wrapping apex giving rise to PDA.  Moderate LAD-diagonal and circumflex plaque -> CT FFR suggested positive findings in distal D1, D2 and distal circumflex.  Referred for cath. ==> FALSE POSITIVE  . ESOPHAGOGASTRODUODENOSCOPY    . eye cysts Bilateral   . LASIK    . LEFT HEART CATH AND CORONARY ANGIOGRAPHY N/A 05/16/2018   Procedure: LEFT HEART CATH AND CORONARY ANGIOGRAPHY;  Surgeon: Belva Crome, MD;  Location: [facility] INVASIVE CV LAB:  Angiographically normal coronary arteries with LVEDP of 21 mmHg.  Large draping hyperdominant LAD that wraps the apex and provides distal half of the PDA.  Relatively small caliber distal Cx -->proxLPDA, non-dom RCA. No Cx or Diag lesions -- FALSE + CT FFR  . LEFT HEART CATH AND CORONARY ANGIOGRAPHY  2009/March 2012  2009: (Dr. Alvie Heidelberg) Nonobstructive CAD; 2012: Minimal CAD --> false positive stress test  . NM MYOVIEW LTD  01/2016; 01/2018   a)LOW RISK. No ischemia or infarction;; b) EF 55-60%. NO ST changes.  No ischemia or Infarction. NO significant RV enlargement.  LOW RISK.  Marland Kitchen Polysomnogram  02/2018   (Dr. Brett Fairy from neurology): Split study was not adequately done due to low AHI.  She slept reclined and had an AHI less than 10, prolonged hypoxemia and no hypercapnia. -->  She has home oxygen already prescribed, but did not meet criteria for nightly oxygen.  (Was noted that the patient did not cooperate well with study.  Titration was discussed.  Marland Kitchen PRESSURE SENSOR/CARDIOMEMS N/A 11/09/2019   Procedure: PRESSURE SENSOR/CARDIOMEMS;  Surgeon: Larey Dresser, MD;  Location: [facility] CV LAB;  Service: Cardiovascular;  Laterality: N/A;  . RECTAL POLYPECTOMY    . RIGHT/LEFT HEART CATH  AND CORONARY ANGIOGRAPHY N/A 04/06/2019   Procedure: RIGHT/LEFT HEART CATH AND CORONARY ANGIOGRAPHY;  Surgeon: Leonie Man, MD;  Location: [facility] INVASIVE CV LAB; angiographically normal coronary arteries.  Moderately elevated AD LVEDP..  Mild pulmonary hypertension: PA P 56/14 mmHg-mean 31 mmHg.  RAP 10 mmHg.  RV P-EDP 54/4 mmHg - 12 mmHg.  PCWP 11-15 mmHg.  CO-CI: 7.35-3.73.  . TONSILLECTOMY    . TRANSTHORACIC ECHOCARDIOGRAM  01/2016; 05/2017   A) Normal LV chamber size with mod LVH pattern. EF 50-55%. Severe LA dilation. Mod RA dilation. PAP elevated at 38 mmHg;; B) Mild concentric LVH.  EF 55-60% & no RWMA.  Grade 2 DD.  Diastolic flattening of the ventricular septum == ? elevated PAP.  Mild aortic valve calcification/sclerosis.  Mod LA dilation.  Mild RV dilation & Mod RA dilation  . TRANSTHORACIC ECHOCARDIOGRAM  10/2017; 01/2018   a) EF 55-60%.  No RWMA,  Moderate LA dilation.  Mild LA dilation.  Peak PA pressures ~57 mmHg (moderate);;; b) EF 55-60%.  GRII DD.  Suggestion of RV volume overload with moderate RV dilation.  Severe LA and RA dilation.Marland Kitchen  PA pressure ~67%.  . TRANSTHORACIC ECHOCARDIOGRAM  01/2019   Normal LV size and function.  EF 50 to 55%.  Impaired relaxation.  RV appears to have moderately reduced function.  Severely enlarged.  Cannot fully assess RV pressures.  Hypermobile interatrial septum.  Right atrium severely dilated.  . TUBAL LIGATION    . vaginal cyst removed     several times    OB History   No obstetric history on file.    Family History  Problem Relation Age of Onset  . Allergies Father   . Heart disease Father        before 49  . Heart failure Father   . Hypertension Father   . Hyperlipidemia Father   . Heart disease Mother   . Diabetes Mother   . Hypertension Mother   . Hyperlipidemia Mother   . Hypertension Sister   . Heart disease Sister        before 80  . Hyperlipidemia Sister   . Diabetes Brother   . Hypertension Brother   . Diabetes Sister     . Hypertension Sister   . Diabetes Son   . Hypertension Son   . Cancer Other     Social History   Tobacco Use  . Smoking status: Former Smoker    Packs/day: 0.50    Years: 25.00    Pack years: 12.50    Types: Cigarettes  . Smokeless tobacco: Never  Used  . Tobacco comment: quit smoking 20+ytrs ago  Vaping Use  . Vaping Use: Never used  Substance Use Topics  . Alcohol use: No  . Drug use: No    Home Medications Prior to Admission medications   Medication Sig Start Date End Date Taking? Authorizing Provider  Accu-Chek Softclix Lancets lancets Use as instructed to check blood sugars 2 times per day dx: e11.22 06/25/19   Minette Brine, FNP  acetaminophen (TYLENOL) 500 MG tablet Take 500-1,000 mg by mouth every 4 (four) hours as needed for moderate pain.     [provider]  albuterol (VENTOLIN HFA) 108 (90 Base) MCG/ACT inhaler Inhale 1-2 puffs into the lungs every 6 (six) hours as needed for wheezing or shortness of breath.    [provider]  aspirin 81 MG chewable tablet Chew 1 tablet (81 mg total) by mouth daily. 11/12/19   Georgette Shell, MD  atorvastatin (LIPITOR) 10 MG tablet Take 1 tablet (10 mg total) by mouth every evening. 11/17/19   [name], MD  B Complex-C (B-COMPLEX WITH VITAMIN C) tablet Take 1 tablet by mouth daily.    [provider]  B-D ULTRAFINE III SHORT PEN 31G X 8 MM MISC USE DAILY WITH VICTOZA AND/OR NOVOLOG. 12/16/19   [name], MD  Blood Glucose Calibration (ACCU-CHEK AVIVA) SOLN Use as directed dx: e11.22 02/17/19   [name], MD  Blood Glucose Monitoring Suppl (ACCU-CHEK AVIVA PLUS) w/Device KIT Use as directed to check blood sugars 2 times per day dx: e11.22 02/09/19   [name], MD  carvedilol (COREG) 25 MG tablet TAKE 1 TABLET BY MOUTH TWICE DAILY WITH MEALS 12/14/19   Leonie Man, MD  Cholecalciferol (VITAMIN D-3) 1000 units CAPS Take 1,000 Units by mouth daily.    [provider]   dexlansoprazole (DEXILANT) 60 MG capsule Take 1 capsule (60 mg total) by mouth daily. 12/21/19   [name], MD  diclofenac sodium (VOLTAREN) 1 % GEL Apply 2 g topically 4 (four) times daily as needed (pain). 05/17/18   Geradine Girt, DO  furosemide (LASIX) 80 MG tablet Take 80 mg by mouth daily.     [provider]  glucose blood (ACCU-CHEK AVIVA PLUS) test strip Use as instructed to check blood sugars 2 times per day dx: e11.22 06/25/19   Minette Brine, FNP  hydrALAZINE (APRESOLINE) 25 MG tablet Take 3 tablets (75 mg total) by mouth 3 (three) times daily. 12/03/19 01/02/20  Lyda Jester M, PA-C  hydrocortisone cream 1 % Apply 1 application topically daily as needed for itching.    [provider]  insulin lispro (HUMALOG KWIKPEN) 100 UNIT/ML KwikPen Inject 0.02-0.04 mLs (2-4 Units total) into the skin 3 (three) times daily as needed (high blood sugar over 150). 09/14/19   [name], MD  Isopropyl Alcohol (ALCOHOL WIPES) 70 % MISC Apply 1 each topically 2 (two) times daily. 06/25/19   Minette Brine, FNP  isosorbide mononitrate (IMDUR) 30 MG 24 hr tablet Take 1 tablet (30 mg total) by mouth daily. 11/11/19 11/10/20  Georgette Shell, MD  lamoTRIgine (LAMICTAL) 150 MG tablet Take 1 tablet (150 mg total) by mouth 2 (two) times daily. 09/11/19   Cameron Sprang, MD  levETIRAcetam (KEPPRA) 500 MG tablet 2 tablets at bedtime 12/21/19   Cameron Sprang, MD  Menthol, Topical Analgesic, (STOPAIN ROLL-ON EX) Apply 1 application topically daily as needed (pain).    [provider]  Multiple Vitamins-Minerals (MULTIVITAMIN WITH MINERALS) tablet  Take 1 tablet by mouth daily.    [provider]  neomycin-bacitracin-polymyxin (NEOSPORIN) ointment Apply 1 application topically as needed for wound care.     [provider]  OZEMPIC, 1 MG/DOSE, 2 MG/1.5ML SOPN INJECT '1MG'$  INTO THE SKIN ONCE A WEEK Patient taking differently: Inject 1 mg into the muscle once a  week. Thursday 11/02/19   [name], MD  Pancrelipase, Lip-Prot-Amyl, (ZENPEP) 25000-79000 units CPEP Take 1-2 capsules by mouth See admin instructions. Take 2 capsules in the morning, 1 capsule with lunch, and 2 capsules in the evening.    [provider]  polyethylene glycol (MIRALAX / GLYCOLAX) 17 g packet Take 17 g by mouth daily as needed for moderate constipation or severe constipation. 11/11/19   Georgette Shell, MD  potassium chloride SA (KLOR-CON) 20 MEQ tablet TAKE 2 TABLETS EVERY DAY BUT WHEN TAKING METOLAZONE TAKE 3 TABLETS DAILY AS DIRECTED 12/17/19   Leonie Man, MD  sertraline (ZOLOFT) 50 MG tablet Take 1 tablet (50 mg total) by mouth daily. 11/17/19   [name], MD  tolnaftate (TINACTIN) 1 % cream Apply 1 application topically daily as needed (foot fungus).     [provider]  TOUJEO SOLOSTAR 300 UNIT/ML Solostar Pen INJECT 36 UNITS INTO THE SKIN AT BEDTIME. Patient taking differently: 34 Units.  12/16/19   [name], MD    Allergies    Other, Penicillins, Sulfa antibiotics, Aspirin, and Codeine  Review of Systems   Review of Systems All systems reviewed and negative, other than as noted in HPI.  Physical Exam Updated Vital Signs BP (!) 164/79 (BP Location: Left Arm)   Pulse 61   Temp 98.3 F (36.8 C) (Oral)   Resp 14   SpO2 95%   Physical Exam Vitals and nursing note reviewed.  Constitutional:      General: She is not in acute distress.    Appearance: She is well-developed.  HENT:     Head: Normocephalic and atraumatic.  Eyes:     General:        Right eye: No discharge.        Left eye: No discharge.     Conjunctiva/sclera: Conjunctivae normal.  Cardiovascular:     Rate and Rhythm: Normal rate and regular rhythm.     Heart sounds: Normal heart sounds. No murmur heard.  No friction rub. No gallop.   Pulmonary:     Effort: Pulmonary effort is normal. No respiratory distress.     Breath sounds: Normal breath sounds.    Abdominal:     General: There is no distension.     Palpations: Abdomen is soft.     Tenderness: There is no abdominal tenderness.  Musculoskeletal:        General: No tenderness.     Cervical back: Neck supple.  Skin:    General: Skin is warm and dry.  Neurological:     Mental Status: She is alert and oriented to person, place, and time.     Sensory: Sensory deficit present.     Comments: Mild global weakness. No focal deficits.   Psychiatric:        Behavior: Behavior normal.        Thought Content: Thought content normal.     ED Results / Procedures / Treatments   Labs (all labs ordered are listed, but only abnormal results are displayed) Labs Reviewed  CBC WITH DIFFERENTIAL/PLATELET - Abnormal; Notable for the following components:      Result  Value   Hemoglobin 9.1 (*)    HCT 30.7 (*)    MCV 73.8 (*)    MCH 21.9 (*)    MCHC 29.6 (*)    RDW 16.4 (*)    All other components within normal limits  BASIC METABOLIC PANEL  URINALYSIS, ROUTINE W REFLEX MICROSCOPIC    EKG EKG Interpretation  Date/Time:  Thursday January 28 2020 19:26:24 EDT Ventricular Rate:  62 PR Interval:    QRS Duration: 101 QT Interval:  423 QTC Calculation: 430 R Axis:   110 Text Interpretation: Sinus rhythm Right axis deviation Confirmed by Virgel Manifold [phone]) on 01/28/2020 8:00:47 PM   Radiology No results found.  Procedures Procedures (including critical care time)  Medications Ordered in ED Medications - No data to display  ED Course  I have reviewed the triage vital signs and the nursing notes.  Pertinent labs & imaging results that were available during my care of the patient were reviewed by me and considered in my medical decision making (see chart for details).    MDM Rules/Calculators/A&P                          73yF with generalized weakness. Suspect some degree of post-ictal state. Slow to respond but not confused. Afebrile. Nontoxic. Will check labs and CT head.    Final Clinical Impression(s) / ED Diagnoses Final diagnoses:  Seizure [facility])    Rx / DC Orders ED Discharge Orders    None       Virgel Manifold, MD 02/01/20 [phone]

## 2020-01-28 NOTE — ED Triage Notes (Signed)
Pt BIB [facility] from home c/o weakness. Pt and family states she has felt weak all day. Pt has a hx of seizures and prior to EMS arrival pt had 2 seizures lasting 1 min. Pt A&O but feels tired she states. Pt denies any pain. Pt has a hx of a previous stroke with left sided weakness due to that. Pt is on Keppra and takes her medication daily family states.

## 2020-01-28 NOTE — ED Notes (Signed)
[name], sister, [phone] would like an update when available

## 2020-01-30 ENCOUNTER — Other Ambulatory Visit: Payer: Self-pay | Admitting: Internal Medicine

## 2020-02-01 ENCOUNTER — Ambulatory Visit (INDEPENDENT_AMBULATORY_CARE_PROVIDER_SITE_OTHER): Payer: Medicare HMO

## 2020-02-01 ENCOUNTER — Other Ambulatory Visit: Payer: Self-pay

## 2020-02-01 ENCOUNTER — Ambulatory Visit: Payer: Medicare HMO

## 2020-02-01 DIAGNOSIS — I5032 Chronic diastolic (congestive) heart failure: Secondary | ICD-10-CM | POA: Diagnosis not present

## 2020-02-01 DIAGNOSIS — E1122 Type 2 diabetes mellitus with diabetic chronic kidney disease: Secondary | ICD-10-CM

## 2020-02-01 DIAGNOSIS — I110 Hypertensive heart disease with heart failure: Secondary | ICD-10-CM | POA: Diagnosis not present

## 2020-02-01 DIAGNOSIS — G40909 Epilepsy, unspecified, not intractable, without status epilepticus: Secondary | ICD-10-CM

## 2020-02-01 DIAGNOSIS — N182 Chronic kidney disease, stage 2 (mild): Secondary | ICD-10-CM

## 2020-02-01 DIAGNOSIS — Z794 Long term (current) use of insulin: Secondary | ICD-10-CM | POA: Diagnosis not present

## 2020-02-01 MED ORDER — OZEMPIC (1 MG/DOSE) 2 MG/1.5ML ~~LOC~~ SOPN: PEN_INJECTOR | SUBCUTANEOUS | 1 refills | Status: DC

## 2020-02-01 NOTE — Chronic Care Management (AMB) (Signed)
Chronic Care Management Pharmacy  Name: [name]  MRN: [id number] DOB: 14-May-1946  Chief Complaint/ HPI  [name],  74 y.o. , female presents for their Follow-Up CCM visit with the clinical pharmacist via telephone due to COVID-19 Pandemic.  PCP : [name], MD  Their chronic conditions include: Hypertension, Chronic diastolic CHF, Diabetes, Seizure disorder, Rheumatoid arthritis, Dyslipidemia  Office Visits: 01/25/20 Telephone call: Notified pt that [place] samples are ready for pickup  01/06/20 AWV  11/17/19 OV: Presented for hospitalization follow up. No seizure activity since discharge. Pt reported she could not tolerate Keppra 586m twice daily and that Neurology said she could take 1/2 tablet in the morning and 1 tablet in the evening. Pt noted memory loss as a problem. Advised that it could be related to seizure activity or elevated blood sugars. Was discharged on Lantus, but advised to resume Toujeo as originally prescribed. Advised to check blood sugar every morning in order for insulin dose adjustments to be made. Labs ordered (BMP8+EGFR, CBC no Diff).   09/28/19 OV: Presented for evaluation of back and foot pain. Chronic left-sided sciatica flare. Administered Kenalog 66mIM. Advised to get magnesium oil to rub on low back. Consider Neurosurgery evaluation. Referred to Podiatry for orthotics. Importance of medication adherence stressed. BP uncontrolled, but pt had not taken fluid pill.    08/24/19 OV: Presented for DM and HTN follow up. Reported Ed prescribed Keppra for seizures on 2/6, but pt has not started medication. Pt advised to take medication as prescribed by ED physician. Will try to move up Neuro appt. Labs ordered (HgbA1c, BMP8+EGFR, potassium, lamotrigine).   Consult Visits: 01/28/20 ED: Presented for evaluation of generalized weakness and possible post-ictal state. Slow to respond but not confused. Brain CT stable from prior exam. No medication changes.    01/14/20 Neurology MyChart message: Notify pt that brain wave test was normal. Pt to continue lamotrigine 150102mwice daily and Keppra 500m79mtablets at bedtime.   01/07/20 Cardiology OV w/ Dr. HardEllyn Hackntinue follow up with Advanced heart failure clinic (Dr. McleAundra Dubinontinue Lasix sliding. Continue medications as prescribed by Dr. McleAundra Dubin7/13/21 Podiatry OV w/ Dr. StovCannon Kettlealuation of bilateral foot wound that has been present for several years. Significant pain in both feet. Xrays from 2014 reviewed, consistent with end-stage pes planus and arthritis. Discussed treatment option. Ordered arthritic panel. Pt advised to continue supportive devices for ambulation, supportive shoes, and physical therapy exercises given. Treatment options are limited (ankle fusion, however lack of success discussed).  01/01/20 Referred to GI for hematochezia  12/30/19 48-hr Ambulatory video EEG: Results were normal. This does not exclude clinical diagnosis of epilepsy. Typical events were not captured.  12/21/19 Neurology telephone call: Dr. AquiDelice Leschld like to do a 48-hour EEG to determine where seizures are coming from. Increase Keppra to 500mg73mablets at bedtime. Continue Lamictal 150mg 21m   12/17/19 Cardiology OV w/ Dr. McLeanAundra Dubinented for CHF follow up. Occasional atypical chest pain. Dyspnea comes and goes. Occasional dizzy episodes. Generally feels weak and fatigued. Not volume overloaded, NYHA class II symptoms. Continue Lasix 80mg d8m. Will have pt see GI for hematochezia. No role for selective pulmonary vasodilators. Continue Lasix 80mg da7m 12/03/19 Cardiology OV w/B. Simmons PAC: Follow up for chronic diastolic post hospitalization. Pt recently evaluated for syncope. Heart monitor showed no significant arrhythmia. Continue carvedilol 25mg BID56mntinue Imdur 30mg dail87mncrease hydralazine to 75mg twice18mly or three times daily  (note states  both directions).   11/09/19 CardioMEMS  placed  11/08/19-11/11/19 ED admission: Presented with syncope. Hospital course complicated by seizure. Cardiology placed CardioMEMS and thinks her symptoms are secondary to seizures, but seizure evaluation in hospital was negative (EEG, CT head, MRI brain). Neurology recommended increasing Keppra to 565m twice daily and continue Lamictal. Lantus decreased to 5 units at bedtime due to hypoglycemic episode in hospital. Follow up with PCP in 1-2 weeks. Obtain BMP/CBC in 1 week. Follow up with Cardiology and Neurology.  09/15/19 Neurology telephone call: Pt called and reported possible side effect from Keppra (light-headed and no energy). Instructed pt to decrease Keppra to 5029mat bedtime.   09/11/19 Neurology OV w/ Dr. AqDelice LeschPresent for follow up regarding seizures. Start Keppra 50039mID. Continue Lamictal 150m3mD. Follow up in 6 months.   09/08/19 Cardiology OV w/ Dr. McLeAundra Dubinesented for follow up of CHF. SOB walking around house. 3 pillow orthopnea chronically. NYHA III, not volume overloaded. Continue Lasix 80mg23mly. Do not think metolazone needed. Arrange PFTs. Follow up in 3 months.   08/01/19 ED visit: Presented for chest pain and seizures. Started Keppra 500mg 54me daily. Follow up with primary neurologist.   07/23/19 Cardiology OV w/ Dr. McLeanAundra Dubininue current medications. Lab work done. Will schedule pt for chest Xray and VQ scan. PT will need pulmonary function testing once they begin doing outpatient. Referred for CardioMEMS device.   07/10/19 Neurology Telephone call: Left message instructing pt to increase Lamictal to 150mg B86mPrevious dose 125mg BI71mut pt reported seizures).   06/24/19 Neurology OV w/ A. Lomax: Presented for 7 month follow up of OSA on CPAP. Pt is 80-90% compliant with CPAP use. Finds it beneficial and denied any issues. Follow up with Dr. Alva ([facility]) as directed for CPAP management and hypoventilation syndrome.   05/28/19 Pulmonology OV w/ Dr. Alva:  [facility] Sohonue Lasix 80 mg daily and 40mg on 59mweekends. Breathing improved with weight loss. CPAP setting working well. Pt is fairly compliant with CPAP, encouraged 4-6 hrs nightly use.   CCM Encounters: 11/19/19 SW: Referred to PharmD to address adherence issues.   09/07/19 RN: Evaluated current DM treatment plan. Collaborated with PCP regarding Humalog refill needed. Determined pt received Keppra via mail but has not started taking, wants to see Neurology first.   09/02/19 SW: Determined [facility]. Discussed BP. PharmD: [facility]   08/07/19 SW: Follow up with pt post-ED visit. Discussed new medication and plan to follow up with neurology.   07/06/19 PharmD: Comprehensive medication review performed. PT reported seizure activity for past 2-3 weeks. Advised caregiver (sister) to call Dr. Aquino reDelice Leschg this matter ASAP. Notified Dr. Sanders. [facility]: Outpatient Encounter Medications as of 02/01/2020  Medication Sig  . furosemide (LASIX) 80 MG tablet Take 80 mg by mouth daily. 80mg dail7mnday-Friday, 40mg on Sa67may and Sunday  . glucose blood (ACCU-CHEK AVIVA PLUS) test strip Use as instructed to check blood sugars 2 times per day dx: e11.22  . isosorbide mononitrate (IMDUR) 30 MG 24 hr tablet Take 1 tablet (30 mg total) by mouth daily.  . lamoTRIgiMarland Kitchene (LAMICTAL) 150 MG tablet Take 1 tablet (150 mg total) by mouth 2 (two) times daily.  . levETIRAcMarland Kitchentam (KEPPRA) 500 MG tablet 2 tablets at bedtime  . Semaglutide, 1 MG/DOSE, (OZEMPIC, 1 MG/DOSE,) 2 MG/1.5ML SOPN INJECT 1MG INTO THE SKIN ONCE  A WEEK  . TOUJEO SOLOSTAR 300 UNIT/ML Solostar Pen INJECT 36 UNITS INTO THE SKIN AT BEDTIME. (Patient taking differently: 34 Units. )  . Accu-Chek Softclix Lancets lancets Use as instructed to check blood sugars 2 times per day dx: e11.22  . acetaminophen (TYLENOL) 500 MG tablet Take 500-1,000  mg by mouth every 4 (four) hours as needed for moderate pain.   Marland Kitchen albuterol (VENTOLIN HFA) 108 (90 Base) MCG/ACT inhaler Inhale 1-2 puffs into the lungs every 6 (six) hours as needed for wheezing or shortness of breath.  Marland Kitchen aspirin 81 MG chewable tablet Chew 1 tablet (81 mg total) by mouth daily.  Marland Kitchen atorvastatin (LIPITOR) 10 MG tablet Take 1 tablet (10 mg total) by mouth every evening.  . B Complex-C (B-COMPLEX WITH VITAMIN C) tablet Take 1 tablet by mouth daily.  . B-D ULTRAFINE III SHORT PEN 31G X 8 MM MISC USE DAILY WITH VICTOZA AND/OR NOVOLOG.  . Blood Glucose Calibration (ACCU-CHEK AVIVA) SOLN Use as directed dx: e11.22  . Blood Glucose Monitoring Suppl (ACCU-CHEK AVIVA PLUS) w/Device KIT Use as directed to check blood sugars 2 times per day dx: e11.22  . carvedilol (COREG) 25 MG tablet TAKE 1 TABLET BY MOUTH TWICE DAILY WITH MEALS  . Cholecalciferol (VITAMIN D-3) 1000 units CAPS Take 1,000 Units by mouth daily.  Marland Kitchen dexlansoprazole (DEXILANT) 60 MG capsule Take 1 capsule (60 mg total) by mouth daily.  . diclofenac sodium (VOLTAREN) 1 % GEL Apply 2 g topically 4 (four) times daily as needed (pain).  . hydrALAZINE (APRESOLINE) 25 MG tablet Take 3 tablets (75 mg total) by mouth 3 (three) times daily. (Patient taking differently: Take 75 mg by mouth in the morning and at bedtime. )  . hydrocortisone cream 1 % Apply 1 application topically daily as needed for itching.  . insulin lispro (HUMALOG KWIKPEN) 100 UNIT/ML KwikPen Inject 0.02-0.04 mLs (2-4 Units total) into the skin 3 (three) times daily as needed (high blood sugar over 150). (Patient not taking: Reported on 02/01/2020)  . Isopropyl Alcohol (ALCOHOL WIPES) 70 % MISC Apply 1 each topically 2 (two) times daily.  . Menthol, Topical Analgesic, (STOPAIN ROLL-ON EX) Apply 1 application topically daily as needed (pain).  . Multiple Vitamins-Minerals (MULTIVITAMIN WITH MINERALS) tablet Take 1 tablet by mouth daily.  Marland Kitchen neomycin-bacitracin-polymyxin  (NEOSPORIN) ointment Apply 1 application topically as needed for wound care.   . Pancrelipase, Lip-Prot-Amyl, (ZENPEP) 25000-79000 units CPEP Take 1-2 capsules by mouth See admin instructions. Take 2 capsules in the morning, 1 capsule with lunch, and 2 capsules in the evening.  . polyethylene glycol (MIRALAX / GLYCOLAX) 17 g packet Take 17 g by mouth daily as needed for moderate constipation or severe constipation.  . potassium chloride SA (KLOR-CON) 20 MEQ tablet TAKE 2 TABLETS EVERY DAY BUT WHEN TAKING METOLAZONE TAKE 3 TABLETS DAILY AS DIRECTED  . sertraline (ZOLOFT) 50 MG tablet Take 1 tablet (50 mg total) by mouth daily.  Marland Kitchen tolnaftate (TINACTIN) 1 % cream Apply 1 application topically daily as needed (foot fungus).   . [DISCONTINUED] OZEMPIC, 1 MG/DOSE, 2 MG/1.5ML SOPN INJECT 1MG INTO THE SKIN ONCE A WEEK (Patient taking differently: Inject 1 mg into the muscle once a week. Thursday)   No facility-administered encounter medications on file as of 02/01/2020.    Current Diagnosis/Assessment:  SDOH Interventions     Most Recent Value  SDOH Interventions  Financial Strain Interventions Intervention Not Indicated     Goals Addressed  This Visit's Progress   . Pharmacy Care Plan       CARE PLAN ENTRY (see longitudinal plan of care for additional care plan information)  Current Barriers:  . Chronic Disease Management support, education, and care coordination needs related to Hypertension, Hyperlipidemia, Diabetes, and Heart Failure   Hypertension BP Readings from Last 3 Encounters:  12/21/19 116/70  12/17/19 140/65  12/03/19 132/74   . Pharmacist Clinical Goal(s): o Over the next 90 days, patient will work with PharmD and providers to maintain BP goal <130/80 . Current regimen:  o Carvedilol 52m twice daily o Furosemide 80 mg daily  o Hydralazine 210m3 tablets twice daily . Interventions: o Contact cardiology to clarify dosing of hydralazine and  furosemide - Determined patient taking Furosemide 8032maily Monday through Friday and 2m59mily Saturday and Sunday - Cardiology note from 7/15 mentions sliding scale Lasix and three times daily hydralazine o Advised patient to take medications consistently at the same time each day o Discussed appropriate goals for blood pressure less than 130/80 . Patient self care activities - Over the next 90 days, patient will: o Check BP daily, document, and provide at future appointments o Ensure daily salt intake < 2300 mg/day  Hyperlipidemia Lab Results  Component Value Date/Time   LDLCALC 65 12/17/2019 11:46 AM   LDLCALC 65 10/22/2018 02:46 PM   LDLDIRECT 66.0 01/27/2015 11:04 AM   . Pharmacist Clinical Goal(s): o Over the next 90 days, patient will work with PharmD and providers to maintain LDL goal < 70 . Current regimen:  o Atorvastatin 10mg60mly . Interventions: o Dietary recommendations provided . Patient self care activities - Over the next 90 days, patient will: o Take cholesterol medication daily as directed  Diabetes Lab Results  Component Value Date/Time   HGBA1C 7.6 (H) 11/09/2019 12:21 AM   HGBA1C 8.0 (H) 08/24/2019 03:30 PM   . Pharmacist Clinical Goal(s): o Over the next 90 days, patient will work with PharmD and providers to achieve A1c goal <7% . Current regimen:  o Humalog Kwikpen 2-4 units three times daily as needed o Ozempic 1mg w35mly o Toujeo 300 units/mL 34 units at bedtime . Interventions: o Dietary recommendations provided o Discussed hypoglycemia and appropriate treatment . Patient self care activities - Over the next 90 days, patient will: o Check blood sugar 3-4 times daily, document, and provide at future appointments o Contact provider with any episodes of hypoglycemia  Medication management . Pharmacist Clinical Goal(s): o Over the next 90 days, patient will work with PharmD and providers to maintain optimal medication adherence . [facility] o Comprehensive medication review performed. o Medication list updated o Continue current medication management strategy o Clarified dosing of Keppra and Lamictal with patient per Neurology note on 01/14/20 - Keppra 500mg 241mlets nightly  - Lamictal 150mg tw42mdaily . Patient self care activities - Over the next 90 days, patient will: o Focus on medication adherence by using pill box o Take medications as prescribed o Report any questions or concerns to PharmD and/or provider(s)  Please see past updates related to this goal by clicking on the "Past Updates" button in the selected goal        Diabetes   Recent Relevant Labs: Lab Results  Component Value Date/Time   HGBA1C 7.6 (H) 11/09/2019 12:21 AM   HGBA1C 8.0 (H) 08/24/2019 03:30 PM   MICROALBUR 10 01/06/2020 12:16 PM   MICROALBUR 10 10/22/2018 12:45 PM  Checking BG: 3x per Day at least and more if too low or too high  Recent FBG Readings: 128, 105 Recent pre-meal BG readings: 90, 103, 86  Recent 2hr PP BG readings:   Recent HS BG readings:  Patient has failed these meds in past: Jardiance, Invokana, Lantus, Levemir, Victoza, Metformin Patient is currently uncontrolled on the following medications:   Humalog Kwikpen 2-4 units three times daily as needed  Ozempic 31m weekly  Toujeo 300 units/mL 34 units at bedtime  Last diabetic Foot exam: Not on file Last diabetic Eye exam:  Lab Results  Component Value Date/Time   HMDIABEYEEXA No Retinopathy 04/15/2019 12:00 AM   We discussed:  Has not had to use Humalog recently  Highest BG reading this month was 150 (pre-meal)  Denies hypoglycemia  Saw podiatrist in July  Plan Continue current medications   Heart Failure   Type: Diastolic  Last ejection fraction: 60-65% on 11/09/19 NYHA Class: II (slight limitation of activity)  Patient has failed these meds in past: Isosorbide dinitrate, metolazone, Bystolic,  torsemide Patient is currently controlled on the following medications:   Carvedilol 235mtwice daily  Furosemide 80 mg daily   Hydralazine 2528m tablets twice daily  Isosorbide mononitrate 69m63mily  Potassium 20me54mtablets daily  We discussed  weighing daily; if you gain more than 3 pounds in one day or 5 pounds in one week call your doctor   Pt mentioned recent weights (220, 213, 218, 221)  Advised pt to let cardiologist know if there are big changes in weight  Pt originally states she is taking hydralazine 75mg 59me times daily, but then says she usually misses the lunch time dose  Only actually taking Hydralazine 75mg t35m daily (morning and evening)  When asked about why she is not taking the lunchtime dose, she said that she usually does not think about it especially if she is not at home  Pt has started taking isosorbide 69mg da39min the morning since our last office visit (was taking 1/2 tablet daily)  Pt states she is taking Lasix 80mg Mon77mthrough Friday, and the 1/2 tablet (40mg) on 25mrday and Sunday   If she is going out she does not take her fluid pil until later in the day to avoid having to go to the bathroom a lot  Plan Continue current medications  Contact Cardiology again regarding hydralazine and furosemide dosing  Hypertension   Office blood pressures are  BP Readings from Last 3 Encounters:  01/29/20 (!) 146/65  01/07/20 111/64  01/06/20 136/64   Patient has failed these meds in the past: Valsartan, losartan Patient is currently controlled on the following medications: See HF  Patient checks BP at home daily in the morning  Patient home BP readings are ranging: 151/70 (8/9), 105/46 HR 51 (8/1)  We discussed:  BP goal is <130/80  Pt does not add extra salt to food  Advised pt to be consistent with how she is taking medication  Plan Continue current medications  Discuss with Cardiology  Hyperlipidemia   Lipid Panel      Component Value Date/Time   CHOL 150 12/17/2019 1146   CHOL 145 10/22/2018 1446   TRIG 47 12/17/2019 1146   HDL 76 12/17/2019 1146   HDL 70 10/22/2018 1446   LDLCALC 65 12/17/2019 1146   LDLCALC 65 10/22/2018 1446   LDLDIRECT 66.0 01/27/2015 1104    The 10-year ASCVD risk score (Goff DC JMikey Bussingt al., 2013) is:  28.2%   Values used to calculate the score:     Age: 31 years     Sex: Female     Is Non-Hispanic African American: Yes     Diabetic: Yes     Tobacco smoker: No     Systolic Blood Pressure: 443 mmHg     Is BP treated: Yes     HDL Cholesterol: 76 mg/dL     Total Cholesterol: 150 mg/dL   Patient has failed these meds in past: N/A Patient is currently controlled on the following medications:   Atorvastatin 59m daily  Plan Continue current medications   Seizure Disorder   Patient has failed these meds in past: N/A Patient is currently uncontrolled on the following medications:   Lamotrigine 1553mtwice daily  Levetiracetam 50072m tablets at bedtime (increased 6/28)  We discussed:   Pt says that she is taking 1 lamotrigine 150m64mblet at night and 1 levetiracetam 500mg97mlet at night  Advised pt how she is supposed to be taking lamotrigine and levetiracetam (lamotrigine BID, and Keppra 2 tablets HS) based on most recent neurology note  Dosing was clarified at our last office visit as well, but pt states she is taking differently today  Pt says she will start taking lamotrigine 150mg 65me daily and 2 tablets of Keppra 500mg a42mdtime  Pt states that she wrote down these directions while we were on the phone  Plan Continue current medications   Vaccines   Reviewed and discussed patient's vaccination history.    Immunization History  Administered Date(s) Administered  . Influenza Split 04/11/2011, 06/30/2013  . Influenza, High Dose Seasonal PF 02/14/2017, 04/24/2018  . Influenza-Unspecified 04/07/2014  . PFIZER SARS-COV-2 Vaccination 08/06/2019,  08/31/2019  . Tdap 05/18/2012  . Zoster 04/14/2014  Prevnar13 received 03/16/15 Pneumovax23 received 01/18/16  Plan  No further pneumonia shots needed at this time  Recommend pt receive Shingrix vaccine at local pharmacy  Medication Management   [facility] Uses pill box? Yes Pt endorses 100% compliance  We discussed:   Importance of taking medications daily as directed  Plan Continue current medication management strategy   Follow up: 6 week phone visit  CourtneJannette FogoD Clinical Pharmacist [facility] [phone]

## 2020-02-01 NOTE — Patient Instructions (Addendum)
Visit Information  Goals Addressed            This Visit's Progress   . Pharmacy Care Plan       CARE PLAN ENTRY (see longitudinal plan of care for additional care plan information)  Current Barriers:  . Chronic Disease Management support, education, and care coordination needs related to Hypertension, Hyperlipidemia, Diabetes, and Heart Failure   Hypertension BP Readings from Last 3 Encounters:  12/21/19 116/70  12/17/19 140/65  12/03/19 132/74   . Pharmacist Clinical Goal(s): o Over the next 90 days, patient will work with PharmD and providers to maintain BP goal <130/80 . Current regimen:  o Carvedilol 25mg  twice daily o Furosemide 80 mg daily  o Hydralazine 25mg  3 tablets twice daily . Interventions: o Contact cardiology to clarify dosing of hydralazine and furosemide - Determined patient taking Furosemide 80mg  daily Monday through Friday and 40mg  daily Saturday and Sunday - Cardiology note from 7/15 mentions sliding scale Lasix and three times daily hydralazine o Advised patient to take medications consistently at the same time each day o Discussed appropriate goals for blood pressure less than 130/80 . Patient self care activities - Over the next 90 days, patient will: o Check BP daily, document, and provide at future appointments o Ensure daily salt intake < 2300 mg/day  Hyperlipidemia Lab Results  Component Value Date/Time   LDLCALC 65 12/17/2019 11:46 AM   LDLCALC 65 10/22/2018 02:46 PM   LDLDIRECT 66.0 01/27/2015 11:04 AM   . Pharmacist Clinical Goal(s): o Over the next 90 days, patient will work with PharmD and providers to maintain LDL goal < 70 . Current regimen:  o Atorvastatin 10mg  daily . Interventions: o Dietary recommendations provided . Patient self care activities - Over the next 90 days, patient will: o Take cholesterol medication daily as directed  Diabetes Lab Results  Component Value Date/Time   HGBA1C 7.6 (H) 11/09/2019 12:21 AM    HGBA1C 8.0 (H) 08/24/2019 03:30 PM   . Pharmacist Clinical Goal(s): o Over the next 90 days, patient will work with PharmD and providers to achieve A1c goal <7% . Current regimen:  o Humalog Kwikpen 2-4 units three times daily as needed o Ozempic 1mg  weekly o Toujeo 300 units/mL 34 units at bedtime . Interventions: o Dietary recommendations provided o Discussed hypoglycemia and appropriate treatment . Patient self care activities - Over the next 90 days, patient will: o Check blood sugar 3-4 times daily, document, and provide at future appointments o Contact provider with any episodes of hypoglycemia  Medication management . Pharmacist Clinical Goal(s): o Over the next 90 days, patient will work with PharmD and providers to maintain optimal medication adherence . Current pharmacy: [facility] . Interventions o Comprehensive medication review performed. o Medication list updated o Continue current medication management strategy o Clarified dosing of Keppra and Lamictal with patient per Neurology note on 01/14/20 - Keppra 500mg  2 tablets nightly  - Lamictal 150mg  twice daily . Patient self care activities - Over the next 90 days, patient will: o Focus on medication adherence by using pill box o Take medications as prescribed o Report any questions or concerns to PharmD and/or provider(s)  Please see past updates related to this goal by clicking on the "Past Updates" button in the selected goal         The patient verbalized understanding of instructions provided today and agreed to receive a mailed copy of patient instruction and/or educational materials.  Telephone follow up appointment  with pharmacy team member scheduled for: 03/14/20 @ 4:15 PM  Jannette Fogo, PharmD Clinical Pharmacist [facility] [phone]   Heart Failure, Self Care Heart failure is a serious condition. This sheet explains things you need to do to take care of  yourself at home. To help you stay as healthy as possible, you may be asked to change your diet, take certain medicines, and make other changes in your life. Your doctor may also give you more specific instructions. If you have problems or questions, call your doctor. What are the risks? Having heart failure makes it more likely for you to have some problems. These problems can get worse if you do not take good care of yourself. Problems may include:  Blood clotting problems. This may cause a stroke.  Damage to the kidneys, liver, or lungs.  Abnormal heart rhythms. Supplies needed:  Scale for weighing yourself.  Blood pressure monitor.  Notebook.  Medicines. How to care for yourself when you have heart failure Medicines Take over-the-counter and prescription medicines only as told by your doctor. Take your medicines every day.  Do not stop taking your medicine unless your doctor tells you to do so.  Do not skip any medicines.  Get your prescriptions refilled before you run out of medicine. This is important. Eating and drinking   Eat heart-healthy foods. Talk with a diet specialist (dietitian) to create an eating plan.  Choose foods that: ? Have no trans fat. ? Are low in saturated fat and cholesterol.  Choose healthy foods, such as: ? Fresh or frozen fruits and vegetables. ? Fish. ? Low-fat (lean) meats. ? Legumes, such as beans, peas, and lentils. ? Fat-free or low-fat dairy products. ? Whole-grain foods. ? High-fiber foods.  Limit salt (sodium) if told by your doctor. Ask your diet specialist to tell you which seasonings are healthy for your heart.  Cook in healthy ways instead of frying. Healthy ways of cooking include roasting, grilling, broiling, baking, poaching, steaming, and stir-frying.  Limit how much fluid you drink, if told by your doctor. Alcohol use  Do not drink alcohol if: ? Your doctor tells you not to drink. ? Your heart was damaged by alcohol,  or you have very bad heart failure. ? You are pregnant, may be pregnant, or are planning to become pregnant.  If you drink alcohol: ? Limit how much you use to:  0-1 drink a day for women.  0-2 drinks a day for men. ? Be aware of how much alcohol is in your drink. In the [place] one drink equals one 12 oz bottle of beer (355 mL), one 5 oz glass of wine (148 mL), or one 1 oz glass of hard liquor (44 mL). Lifestyle   Do not use any products that contain nicotine or tobacco, such as cigarettes, e-cigarettes, and chewing tobacco. If you need help quitting, ask your doctor. ? Do not use nicotine gum or patches before talking to your doctor.  Do not use illegal drugs.  Lose weight if told by your doctor.  Do physical activity if told by your doctor. Talk to your doctor before you begin an exercise if: ? You are an older adult. ? You have very bad heart failure.  Learn to manage stress. If you need help, ask your doctor.  Get rehab (rehabilitation) to help you stay independent and to help with your quality of life.  Plan time to rest when you get tired. Check weight and blood pressure  Weigh yourself every day. This will help you to know if fluid is building up in your body. ? Weigh yourself every morning after you pee (urinate) and before you eat breakfast. ? Wear the same amount of clothing each time. ? Write down your daily weight. Give your record to your doctor.  Check and write down your blood pressure as told by your doctor.  Check your pulse as told by your doctor. Dealing with very hot and very cold weather  If it is very hot: ? Avoid activities that take a lot of energy. ? Use air conditioning or fans, or find a cooler place. ? Avoid caffeine and alcohol. ? Wear clothing that is loose-fitting, lightweight, and light-colored.  If it is very cold: ? Avoid activities that take a lot of energy. ? Layer your clothes. ? Wear mittens or gloves, a hat, and a scarf when  you go outside. ? Avoid alcohol. Follow these instructions at home:  Stay up to date with shots (vaccines). Get pneumococcal and flu (influenza) shots.  Keep all follow-up visits as told by your doctor. This is important. Contact a doctor if:  You gain weight quickly.  You have increasing shortness of breath.  You cannot do your normal activities.  You get tired easily.  You cough a lot.  You don't feel like eating or feel like you may vomit (nauseous).  You become puffy (swell) in your hands, feet, ankles, or belly (abdomen).  You cannot sleep well because it is hard to breathe.  You feel like your heart is beating fast (palpitations).  You get dizzy when you stand up. Get help right away if:  You have trouble breathing.  You or someone else notices a change in your behavior, such as having trouble staying awake.  You have chest pain or discomfort.  You pass out (faint). These symptoms may be an emergency. Do not wait to see if the symptoms will go away. Get medical help right away. Call your local emergency services (911 in the [place] Do not drive yourself to the hospital. Summary  Heart failure is a serious condition. To care for yourself, you may have to change your diet, take medicines, and make other lifestyle changes.  Take your medicines every day. Do not stop taking them unless your doctor tells you to do so.  Eat heart-healthy foods, such as fresh or frozen fruits and vegetables, fish, lean meats, legumes, fat-free or low-fat dairy products, and whole-grain or high-fiber foods.  Ask your doctor if you can drink alcohol. You may have to stop alcohol use if you have very bad heart failure.  Contact your doctor if you gain weight quickly or feel that your heart is beating too fast. Get help right away if you pass out, or have chest pain or trouble breathing. This information is not intended to replace advice given to you by your health care provider. Make sure  you discuss any questions you have with your health care provider. Document Revised: 09/23/2018 Document Reviewed: 09/24/2018 Elsevier Patient Education  [employer].

## 2020-02-01 NOTE — Chronic Care Management (AMB) (Signed)
  Care Management    Consult Note  02/01/2020 Name: [name] MRN: [id number] DOB: Feb 13, 1946  Care management team received notification of patient's recent emergency department visit related to Seizures .Based on review of health record, [name] is currently active in the embedded care coordination program.   Review of patient status, including review of consultants reports, relevant laboratory and other test results, and collaboration with appropriate care team members and the patient's provider was performed as part of comprehensive patient evaluation and provision of chronic care management services.    SW collaborated with embedded PharmD and Consulting civil engineer to inform of patient recent ED visit.  Plan: Patient will be outreached by embedded PharmD on 8/9 and Industry on 8/30 as previously scheduled.  Daneen Schick, BSW, CDP Social Worker, Certified Dementia Practitioner [place] [facility] [place] [facility] [phone]

## 2020-02-02 DIAGNOSIS — Z794 Long term (current) use of insulin: Secondary | ICD-10-CM | POA: Diagnosis not present

## 2020-02-02 DIAGNOSIS — Z9889 Other specified postprocedural states: Secondary | ICD-10-CM | POA: Diagnosis not present

## 2020-02-02 DIAGNOSIS — Z961 Presence of intraocular lens: Secondary | ICD-10-CM | POA: Diagnosis not present

## 2020-02-02 DIAGNOSIS — E119 Type 2 diabetes mellitus without complications: Secondary | ICD-10-CM | POA: Diagnosis not present

## 2020-02-02 LAB — HM DIABETES EYE EXAM

## 2020-02-03 ENCOUNTER — Encounter: Payer: Self-pay | Admitting: Internal Medicine

## 2020-02-03 ENCOUNTER — Other Ambulatory Visit: Payer: Self-pay

## 2020-02-03 MED ORDER — OZEMPIC (1 MG/DOSE) 4 MG/3ML ~~LOC~~ SOPN: PEN_INJECTOR | SUBCUTANEOUS | 2 refills | Status: DC

## 2020-02-04 ENCOUNTER — Ambulatory Visit: Payer: Medicare HMO | Admitting: Sports Medicine

## 2020-02-16 ENCOUNTER — Ambulatory Visit: Payer: Self-pay | Admitting: Internal Medicine

## 2020-02-18 ENCOUNTER — Telehealth (HOSPITAL_COMMUNITY): Payer: Self-pay

## 2020-02-18 NOTE — Telephone Encounter (Signed)
Patient and patients sister [name], called to report that [name] has been experiencing sob,chest pain radiating into left shoulder,abdominal swelling,productive cough, nausea,chills,fatigue. Denies vomiting,fever,loss of taste and smell,loss of appetite,nasal drainage that has been going on for 3 days. The only OTC medication she has tried was Tylenol. Patient is currently in quarantine due to her brother- in- law testing positive for COVID. I advised patient that she needed to be tested for COVID. Then call us once she gets those results. I also advised her that if her symptoms got worse to go to the ER. Patient and patients sister was agreeable with plan. Please advise.

## 2020-02-18 NOTE — Telephone Encounter (Signed)
Agree that she needs a COVID test.

## 2020-02-19 DIAGNOSIS — Z20828 Contact with and (suspected) exposure to other viral communicable diseases: Secondary | ICD-10-CM | POA: Diagnosis not present

## 2020-02-22 ENCOUNTER — Ambulatory Visit: Payer: Self-pay

## 2020-02-22 ENCOUNTER — Telehealth: Payer: Medicare HMO

## 2020-02-22 ENCOUNTER — Other Ambulatory Visit: Payer: Self-pay

## 2020-02-22 ENCOUNTER — Other Ambulatory Visit (HOSPITAL_COMMUNITY): Payer: Self-pay | Admitting: Nurse Practitioner

## 2020-02-22 ENCOUNTER — Encounter: Payer: Self-pay | Admitting: Nurse Practitioner

## 2020-02-22 DIAGNOSIS — E1122 Type 2 diabetes mellitus with diabetic chronic kidney disease: Secondary | ICD-10-CM | POA: Diagnosis not present

## 2020-02-22 DIAGNOSIS — I5032 Chronic diastolic (congestive) heart failure: Secondary | ICD-10-CM

## 2020-02-22 DIAGNOSIS — N182 Chronic kidney disease, stage 2 (mild): Secondary | ICD-10-CM

## 2020-02-22 DIAGNOSIS — U071 COVID-19: Secondary | ICD-10-CM

## 2020-02-22 DIAGNOSIS — Z794 Long term (current) use of insulin: Secondary | ICD-10-CM | POA: Diagnosis not present

## 2020-02-22 DIAGNOSIS — I110 Hypertensive heart disease with heart failure: Secondary | ICD-10-CM | POA: Diagnosis not present

## 2020-02-22 DIAGNOSIS — G40909 Epilepsy, unspecified, not intractable, without status epilepticus: Secondary | ICD-10-CM

## 2020-02-22 NOTE — Progress Notes (Signed)
I connected by phone with [name] and her guardian, [name], on 02/22/2020 at 3:36 PM to discuss the potential use of an new treatment for mild to moderate COVID-19 viral infection in non-hospitalized patients.  This patient is a 74 y.o. female that meets the FDA criteria for Emergency Use Authorization of casirivimab\imdevimab.  Has a (+) direct SARS-CoV-2 viral test result  Has mild or moderate COVID-19   Is ? 74 years of age and weighs ? 40 kg  Is NOT hospitalized due to COVID-19  Is NOT requiring oxygen therapy or requiring an increase in baseline oxygen flow rate due to COVID-19  Is within 10 days of symptom onset  Has at least one of the high risk factor(s) for progression to severe COVID-19 and/or hospitalization as defined in EUA.  Specific high risk criteria : Diabetes, Chronic Lung Disease, DM, CAD, Hypertension, age.   Symptoms started 02/15/20.    I have spoken and communicated the following to the patient or parent/caregiver:  1. FDA has authorized the emergency use of casirivimab\imdevimab for the treatment of mild to moderate COVID-19 in adults and pediatric patients with positive results of direct SARS-CoV-2 viral testing who are 33 years of age and older weighing at least 40 kg, and who are at high risk for progressing to severe COVID-19 and/or hospitalization.  2. The significant known and potential risks and benefits of casirivimab\imdevimab, and the extent to which such potential risks and benefits are unknown.  3. Information on available alternative treatments and the risks and benefits of those alternatives, including clinical trials.  4. Patients treated with casirivimab\imdevimab should continue to self-isolate and use infection control measures (e.g., wear mask, isolate, social distance, avoid sharing personal items, clean and disinfect "high touch" surfaces, and frequent handwashing) according to CDC guidelines.   5. The patient or  parent/caregiver has the option to accept or refuse casirivimab\imdevimab .  After reviewing this information with the patient, The patient agreed to proceed with receiving casirivimab\imdevimab infusion and will be provided a copy of the Fact sheet prior to receiving the infusion.Beckey Rutter, [place], AGNP-C [phone] ([place])

## 2020-02-23 ENCOUNTER — Ambulatory Visit (HOSPITAL_COMMUNITY)
Admission: RE | Admit: 2020-02-23 | Discharge: 2020-02-23 | Disposition: A | Discharge status: Home / Self Care | Payer: Medicare Other | Source: Ambulatory Visit | Attending: Pulmonary Disease | Admitting: Pulmonary Disease

## 2020-02-23 DIAGNOSIS — Z23 Encounter for immunization: Secondary | ICD-10-CM | POA: Insufficient documentation

## 2020-02-23 DIAGNOSIS — U071 COVID-19: Secondary | ICD-10-CM | POA: Insufficient documentation

## 2020-02-23 MED ORDER — METHYLPREDNISOLONE SODIUM SUCC 125 MG IJ SOLR: 125.0000 mg | Freq: Once | INTRAMUSCULAR | Status: DC | PRN

## 2020-02-23 MED ORDER — DIPHENHYDRAMINE HCL 50 MG/ML IJ SOLN: 50.0000 mg | Freq: Once | INTRAMUSCULAR | Status: DC | PRN

## 2020-02-23 MED ORDER — SODIUM CHLORIDE 0.9 % IV SOLN: INTRAVENOUS | Status: DC | PRN

## 2020-02-23 MED ORDER — EPINEPHRINE 0.3 MG/0.3ML IJ SOAJ: 0.3000 mg | Freq: Once | INTRAMUSCULAR | Status: DC | PRN

## 2020-02-23 MED ORDER — FAMOTIDINE IN NACL 20-0.9 MG/50ML-% IV SOLN: 20.0000 mg | Freq: Once | INTRAVENOUS | Status: DC | PRN

## 2020-02-23 MED ORDER — ALBUTEROL SULFATE HFA 108 (90 BASE) MCG/ACT IN AERS: 2.0000 | INHALATION_SPRAY | Freq: Once | RESPIRATORY_TRACT | Status: DC | PRN

## 2020-02-23 MED ORDER — SODIUM CHLORIDE 0.9 % IV SOLN
1200.0000 mg | Freq: Once | INTRAVENOUS | Status: AC
  Administered 2020-02-23: 1200 mg via INTRAVENOUS

## 2020-02-23 NOTE — Progress Notes (Signed)
  Diagnosis: COVID-19  Physician:  Procedure: [facility] Med: casirivimab\imdevimab infusion - Provided patient with casirivimab\imdevimab fact sheet for patients, parents and caregivers prior to infusion.  Complications: No immediate complications noted.  Discharge: Discharged home   Dorene Sorrow 02/23/2020

## 2020-02-23 NOTE — Discharge Instructions (Signed)

## 2020-02-25 NOTE — Patient Instructions (Signed)
Visit Information  Goals Addressed      Patient Stated   .  "to recover from [place] without complications" (pt-stated)        Sister and patient stated  CARE PLAN ENTRY (see longitudinal plan of care for additional care plan information)  Current Barriers:  Marland Kitchen Knowledge Deficits related to Care Coordination needs for monoclonal antibody infusion to minimize symptoms of COVID 19 . Chronic Disease Management support and education needs related to DM II, CHF, Seizure disorder   Nurse Case Manager Clinical Goal(s):  Marland Kitchen Over the next 3 days, patient will verbalize understanding of plan for receiving Regeneron antibody infusion for COVID 19  CCM RN CM Interventions:  02/22/20 call completed with sister [name] and patient  . Inter-disciplinary care team collaboration (see longitudinal plan of care) . Evaluation of current treatment plan related to COVID 19 and patient's adherence to plan as established by provider . Determined patient contracted COVID 19 approximately 7 days ago after being exposed to her brother in law with whom she lives who also contracted COVID 46 . Discussed assistance needed for Care Coordination in order for Ms. Haan to receive the monoclonal antibody infusion to help minimize COVID 19 related symptoms . Determined Sandra's husband recently received this infusion for which his COVID 19 symptoms immediately improved . Determined Ms. Dray is experiencing persistent violent coughing, shortness of breath and intermittent chest pain . Determined her Oxygen level at home is currently 95% and her other vitals signs, BP, HR and temp are within normal range . Collaborated with PCP provider Dr. [name] regarding patient's reported symptoms and positive COVID 19 per patient's sister and caregiver Sanra . Discussed with Dr. Baird Cancer that her office staff will outreach to [facility]'s infusion clinic to request this infusion for Ms. Fredderick Severance, Further discussed that Dr. Baird Cancer  will order Remote [facility] Services to help monitor patient's condition related to COVID 19 . Informed the patient and her sister [name] of plans to request [facility] reach out to schedule Ms. Piatkowski for the monoclonal antibody infusion and orders for Remote Health to visit the patient in her home to further assess her condition, patient and sister verbalize understanding  . Discussed plans with patient for ongoing care management follow up and provided patient with direct contact information for care management team  Patient Self Care Activities:  . Attends all scheduled provider appointments . Calls provider office for new concerns or questions . Supportive sister to assist with care needs  Initial goal documentation       Patient verbalizes understanding of instructions provided today.   Telephone follow up appointment with care management team member scheduled for: 03/02/20  Barb Merino, RN, BSN, CCM Care Management Coordinator [place] [facility]  Direct Phone: [phone]

## 2020-02-25 NOTE — Chronic Care Management (AMB) (Signed)
Chronic Care Management   Follow Up Note   02/22/2020 Name: [name] MRN: [id number] DOB: Oct 06, 1945  Referred by: [name], MD Reason for referral : Chronic Care Management (FU RN CM Call )   [name] is a 74 y.o. year old female who is a primary care patient of [name], MD. The CCM team was consulted for assistance with chronic disease management and care coordination needs.    Review of patient status, including review of consultants reports, relevant laboratory and other test results, and collaboration with appropriate care team members and the patient's provider was performed as part of comprehensive patient evaluation and provision of chronic care management services.    SDOH (Social Determinants of Health) assessments performed: Yes See Care Plan activities for detailed interventions related to [place])   Placed outbound call to patient for a CCM RN CM care plan follow up.     Outpatient Encounter Medications as of 02/22/2020  Medication Sig  . Accu-Chek Softclix Lancets lancets Use as instructed to check blood sugars 2 times per day dx: e11.22  . acetaminophen (TYLENOL) 500 MG tablet Take 500-1,000 mg by mouth every 4 (four) hours as needed for moderate pain.   Marland Kitchen albuterol (VENTOLIN HFA) 108 (90 Base) MCG/ACT inhaler Inhale 1-2 puffs into the lungs every 6 (six) hours as needed for wheezing or shortness of breath.  Marland Kitchen aspirin 81 MG chewable tablet Chew 1 tablet (81 mg total) by mouth daily.  Marland Kitchen atorvastatin (LIPITOR) 10 MG tablet Take 1 tablet (10 mg total) by mouth every evening.  . B Complex-C (B-COMPLEX WITH VITAMIN C) tablet Take 1 tablet by mouth daily.  . B-D ULTRAFINE III SHORT PEN 31G X 8 MM MISC USE DAILY WITH VICTOZA AND/OR NOVOLOG.  . Blood Glucose Calibration (ACCU-CHEK AVIVA) SOLN Use as directed dx: e11.22  . Blood Glucose Monitoring Suppl (ACCU-CHEK AVIVA PLUS) w/Device KIT Use as directed to check blood sugars 2 times per day dx: e11.22  .  carvedilol (COREG) 25 MG tablet TAKE 1 TABLET BY MOUTH TWICE DAILY WITH MEALS  . Cholecalciferol (VITAMIN D-3) 1000 units CAPS Take 1,000 Units by mouth daily.  Marland Kitchen dexlansoprazole (DEXILANT) 60 MG capsule Take 1 capsule (60 mg total) by mouth daily.  . diclofenac sodium (VOLTAREN) 1 % GEL Apply 2 g topically 4 (four) times daily as needed (pain).  . furosemide (LASIX) 80 MG tablet Take 80 mg by mouth daily. 78m daily Monday-Friday, 463mon Saturday and Sunday  . glucose blood (ACCU-CHEK AVIVA PLUS) test strip Use as instructed to check blood sugars 2 times per day dx: e11.22  . hydrocortisone cream 1 % Apply 1 application topically daily as needed for itching.  . insulin lispro (HUMALOG KWIKPEN) 100 UNIT/ML KwikPen Inject 0.02-0.04 mLs (2-4 Units total) into the skin 3 (three) times daily as needed (high blood sugar over 150). (Patient not taking: Reported on 02/01/2020)  . Isopropyl Alcohol (ALCOHOL WIPES) 70 % MISC Apply 1 each topically 2 (two) times daily.  . isosorbide mononitrate (IMDUR) 30 MG 24 hr tablet Take 1 tablet (30 mg total) by mouth daily.  . Marland KitchenamoTRIgine (LAMICTAL) 150 MG tablet Take 1 tablet (150 mg total) by mouth 2 (two) times daily.  . Marland KitchenevETIRAcetam (KEPPRA) 500 MG tablet 2 tablets at bedtime  . Menthol, Topical Analgesic, (STOPAIN ROLL-ON EX) Apply 1 application topically daily as needed (pain).  . Multiple Vitamins-Minerals (MULTIVITAMIN WITH MINERALS) tablet Take 1 tablet by mouth daily.  . Marland Kitcheneomycin-bacitracin-polymyxin (NEOSPORIN) ointment  Apply 1 application topically as needed for wound care.   Marland Kitchen OZEMPIC, 1 MG/DOSE, 4 MG/3ML SOPN INJECT 1MG INTO THE SKIN ONCE A WEEK  . Pancrelipase, Lip-Prot-Amyl, (ZENPEP) 25000-79000 units CPEP Take 1-2 capsules by mouth See admin instructions. Take 2 capsules in the morning, 1 capsule with lunch, and 2 capsules in the evening.  . polyethylene glycol (MIRALAX / GLYCOLAX) 17 g packet Take 17 g by mouth daily as needed for moderate  constipation or severe constipation.  . potassium chloride SA (KLOR-CON) 20 MEQ tablet TAKE 2 TABLETS EVERY DAY BUT WHEN TAKING METOLAZONE TAKE 3 TABLETS DAILY AS DIRECTED  . sertraline (ZOLOFT) 50 MG tablet Take 1 tablet (50 mg total) by mouth daily.  Marland Kitchen tolnaftate (TINACTIN) 1 % cream Apply 1 application topically daily as needed (foot fungus).   [name] SOLOSTAR 300 UNIT/ML Solostar Pen INJECT 36 UNITS INTO THE SKIN AT BEDTIME. (Patient taking differently: 34 Units. )   No facility-administered encounter medications on file as of 02/22/2020.     Objective:  Lab Results  Component Value Date   HGBA1C 7.6 (H) 11/09/2019   HGBA1C 8.0 (H) 08/24/2019   HGBA1C 7.5 (H) 05/19/2019   Lab Results  Component Value Date   MICROALBUR 10 01/06/2020   LDLCALC 65 12/17/2019   CREATININE 0.94 01/28/2020   BP Readings from Last 3 Encounters:  02/23/20 116/65  01/29/20 (!) 146/65  01/07/20 111/64    Goals Addressed      Patient Stated   .  "to recover from [place] without complications" (pt-stated)        Sister and patient stated  CARE PLAN ENTRY (see longitudinal plan of care for additional care plan information)  Current Barriers:  Marland Kitchen Knowledge Deficits related to Care Coordination needs for monoclonal antibody infusion to minimize symptoms of COVID 19 . Chronic Disease Management support and education needs related to DM II, CHF, Seizure disorder   Nurse Case Manager Clinical Goal(s):  Marland Kitchen Over the next 3 days, patient will verbalize understanding of plan for receiving Regeneron antibody infusion for COVID 19  CCM RN CM Interventions:  02/22/20 call completed with sister [name] and patient  . Inter-disciplinary care team collaboration (see longitudinal plan of care) . Evaluation of current treatment plan related to COVID 19 and patient's adherence to plan as established by provider . Determined patient contracted COVID 19 approximately 7 days ago after being exposed to her brother in  law with whom she lives who also contracted COVID 5 . Discussed assistance needed for Care Coordination in order for Ms. Demps to receive the monoclonal antibody infusion to help minimize COVID 19 related symptoms . Determined Sandra's husband recently received this infusion for which his COVID 19 symptoms immediately improved . Determined Ms. Weisel is experiencing persistent violent coughing, shortness of breath and intermittent chest pain . Determined her Oxygen level at home is currently 95% and her other vitals signs, BP, HR and temp are within normal range . Collaborated with PCP provider Dr. [name] regarding patient's reported symptoms and positive COVID 19 per patient's sister and caregiver Sanra . Discussed with Dr. Baird Cancer that her office staff will outreach to [facility]'s infusion clinic to request this infusion for Ms. Fredderick Severance, Further discussed that Dr. Baird Cancer will order [facility] to help monitor patient's condition related to COVID 19 . Informed the patient and her sister [name] of plans to request [facility] reach out to schedule Ms. Bigos for the monoclonal antibody infusion and  orders for Remote Health to visit the patient in her home to further assess her condition, patient and sister verbalize understanding  . Discussed plans with patient for ongoing care management follow up and provided patient with direct contact information for care management team  Patient Self Care Activities:  . Attends all scheduled provider appointments . Calls provider office for new concerns or questions . Supportive sister to assist with care needs  Initial goal documentation       Plan:   Telephone follow up appointment with care management team member scheduled for: 03/02/20  Barb Merino, RN, BSN, CCM Care Management Coordinator [facility]  Direct Phone: [phone]

## 2020-02-28 ENCOUNTER — Other Ambulatory Visit
Admission: RE | Admit: 2020-02-28 | Discharge: 2020-02-28 | Disposition: A | Discharge status: Home / Self Care | Payer: Medicare HMO | Source: Ambulatory Visit | Attending: Nurse Practitioner | Admitting: Nurse Practitioner

## 2020-02-28 DIAGNOSIS — I5032 Chronic diastolic (congestive) heart failure: Secondary | ICD-10-CM | POA: Diagnosis not present

## 2020-02-28 DIAGNOSIS — U071 COVID-19: Secondary | ICD-10-CM | POA: Diagnosis not present

## 2020-02-28 DIAGNOSIS — I272 Pulmonary hypertension, unspecified: Secondary | ICD-10-CM | POA: Diagnosis not present

## 2020-02-28 DIAGNOSIS — Z1159 Encounter for screening for other viral diseases: Secondary | ICD-10-CM | POA: Diagnosis not present

## 2020-02-28 LAB — IRON AND TIBC
Iron: 32 ug/dL (ref 28–170)
Saturation Ratios: 8 % — ABNORMAL LOW (ref 10.4–31.8)
TIBC: 409 ug/dL (ref 250–450)
UIBC: 377 ug/dL

## 2020-02-28 LAB — FERRITIN: Ferritin: 12 ng/mL (ref 11–307)

## 2020-03-02 ENCOUNTER — Telehealth: Payer: Self-pay

## 2020-03-02 ENCOUNTER — Telehealth: Payer: Medicare HMO

## 2020-03-02 NOTE — Telephone Encounter (Cosign Needed)
  Chronic Care Management   Outreach Note  03/02/2020 Name: [name] MRN: [id number] DOB: 01/24/1946  Referred by: [name], MD Reason for referral : Chronic Care Management (FU RN CM Call )   An unsuccessful telephone outreach was attempted today. The patient was referred to the case management team for assistance with care management and care coordination.   Follow Up Plan: A HIPPA compliant phone message was left for the patient providing contact information and requesting a return call.  Telephone follow up appointment with care management team member scheduled for: 04/04/20  Barb Merino, RN, BSN, CCM Care Management Coordinator [place] [facility]  Direct Phone: [phone]

## 2020-03-03 ENCOUNTER — Ambulatory Visit: Payer: Self-pay | Admitting: Internal Medicine

## 2020-03-04 ENCOUNTER — Other Ambulatory Visit: Payer: Self-pay

## 2020-03-04 MED ORDER — ACCU-CHEK FASTCLIX LANCETS MISC: 2 refills | Status: DC

## 2020-03-07 ENCOUNTER — Ambulatory Visit: Payer: Self-pay

## 2020-03-07 ENCOUNTER — Telehealth (HOSPITAL_COMMUNITY): Payer: Self-pay | Admitting: Cardiology

## 2020-03-07 ENCOUNTER — Other Ambulatory Visit: Payer: Self-pay

## 2020-03-07 ENCOUNTER — Telehealth: Payer: Medicare HMO

## 2020-03-07 DIAGNOSIS — I110 Hypertensive heart disease with heart failure: Secondary | ICD-10-CM

## 2020-03-07 DIAGNOSIS — E1122 Type 2 diabetes mellitus with diabetic chronic kidney disease: Secondary | ICD-10-CM

## 2020-03-07 DIAGNOSIS — I5032 Chronic diastolic (congestive) heart failure: Secondary | ICD-10-CM

## 2020-03-07 DIAGNOSIS — U071 COVID-19: Secondary | ICD-10-CM

## 2020-03-07 DIAGNOSIS — G40909 Epilepsy, unspecified, not intractable, without status epilepticus: Secondary | ICD-10-CM

## 2020-03-07 MED ORDER — ALCOHOL PADS 70 % PADS: MEDICATED_PAD | 2 refills | Status: DC

## 2020-03-07 MED ORDER — TRUE METRIX BLOOD GLUCOSE TEST VI STRP: ORAL_STRIP | 2 refills | Status: DC

## 2020-03-07 MED ORDER — TRUE METRIX METER W/DEVICE KIT: PACK | 1 refills | Status: DC

## 2020-03-07 MED ORDER — TRUE METRIX LEVEL 1 LOW VI SOLN: 2 refills | Status: DC

## 2020-03-07 NOTE — Telephone Encounter (Signed)
Sister [name] aware of work in appt 9/15 @1030  Order sent for portable oxygen by PCP

## 2020-03-07 NOTE — Telephone Encounter (Signed)
Patients sister called regarding oxygen desaturation Reports after covid patient required home oxygen  Recently patient was able to decrease oxygen to 1L/min however if patient removed oxygen (to go to bathroom etc), sats decrease in to the 60's. HHRN concerned this desaturation is no longer coming from covid infection and may be related to HF    Weight stable at 211-212 Compliant with medications  Follow up 9/21-however she does not have portable oxygen ordered, her sister has contact PCP for order  Sister would like to know if this requires urgent work in appt this week with HF team ?

## 2020-03-07 NOTE — Telephone Encounter (Signed)
Would be reasonable to work in this week with me on a day I am in hospital or PA/NP.

## 2020-03-08 ENCOUNTER — Ambulatory Visit: Payer: Self-pay

## 2020-03-08 ENCOUNTER — Telehealth: Payer: Self-pay

## 2020-03-08 ENCOUNTER — Telehealth: Payer: Medicare HMO

## 2020-03-08 DIAGNOSIS — U071 COVID-19: Secondary | ICD-10-CM

## 2020-03-08 DIAGNOSIS — I5032 Chronic diastolic (congestive) heart failure: Secondary | ICD-10-CM

## 2020-03-08 DIAGNOSIS — G40909 Epilepsy, unspecified, not intractable, without status epilepticus: Secondary | ICD-10-CM

## 2020-03-08 DIAGNOSIS — E1122 Type 2 diabetes mellitus with diabetic chronic kidney disease: Secondary | ICD-10-CM

## 2020-03-08 NOTE — Telephone Encounter (Signed)
The pt's sister [name] was notified that the rx for the pt's portable oxygen tank was faxed to [facility] yesterday.

## 2020-03-09 ENCOUNTER — Encounter (HOSPITAL_COMMUNITY): Payer: Self-pay

## 2020-03-09 ENCOUNTER — Other Ambulatory Visit: Payer: Self-pay

## 2020-03-09 ENCOUNTER — Ambulatory Visit (INDEPENDENT_AMBULATORY_CARE_PROVIDER_SITE_OTHER): Payer: Medicare HMO

## 2020-03-09 ENCOUNTER — Ambulatory Visit (INDEPENDENT_AMBULATORY_CARE_PROVIDER_SITE_OTHER): Payer: Medicare HMO | Admitting: Pulmonary Disease

## 2020-03-09 ENCOUNTER — Ambulatory Visit (HOSPITAL_COMMUNITY)
Admission: RE | Admit: 2020-03-09 | Discharge: 2020-03-09 | Disposition: A | Discharge status: Home / Self Care | Payer: Medicare HMO | Source: Ambulatory Visit | Attending: Cardiology | Admitting: Cardiology

## 2020-03-09 ENCOUNTER — Encounter: Payer: Self-pay | Admitting: Pulmonary Disease

## 2020-03-09 VITALS — BP 140/78 | HR 69 | Ht 61.0 in | Wt 219.2 lb

## 2020-03-09 VITALS — BP 122/78 | HR 70 | Temp 98.4°F | Ht 61.0 in | Wt 217.6 lb

## 2020-03-09 DIAGNOSIS — U071 COVID-19: Secondary | ICD-10-CM | POA: Diagnosis not present

## 2020-03-09 DIAGNOSIS — Z8673 Personal history of transient ischemic attack (TIA), and cerebral infarction without residual deficits: Secondary | ICD-10-CM | POA: Diagnosis not present

## 2020-03-09 DIAGNOSIS — J189 Pneumonia, unspecified organism: Secondary | ICD-10-CM | POA: Diagnosis not present

## 2020-03-09 DIAGNOSIS — Z79899 Other long term (current) drug therapy: Secondary | ICD-10-CM | POA: Diagnosis not present

## 2020-03-09 DIAGNOSIS — R0600 Dyspnea, unspecified: Secondary | ICD-10-CM | POA: Insufficient documentation

## 2020-03-09 DIAGNOSIS — Z87891 Personal history of nicotine dependence: Secondary | ICD-10-CM | POA: Diagnosis not present

## 2020-03-09 DIAGNOSIS — E119 Type 2 diabetes mellitus without complications: Secondary | ICD-10-CM | POA: Insufficient documentation

## 2020-03-09 DIAGNOSIS — R0609 Other forms of dyspnea: Secondary | ICD-10-CM

## 2020-03-09 DIAGNOSIS — G4733 Obstructive sleep apnea (adult) (pediatric): Secondary | ICD-10-CM | POA: Diagnosis not present

## 2020-03-09 DIAGNOSIS — I272 Pulmonary hypertension, unspecified: Secondary | ICD-10-CM | POA: Diagnosis not present

## 2020-03-09 DIAGNOSIS — Z7901 Long term (current) use of anticoagulants: Secondary | ICD-10-CM | POA: Diagnosis not present

## 2020-03-09 DIAGNOSIS — E785 Hyperlipidemia, unspecified: Secondary | ICD-10-CM | POA: Diagnosis not present

## 2020-03-09 DIAGNOSIS — Z452 Encounter for adjustment and management of vascular access device: Secondary | ICD-10-CM | POA: Diagnosis not present

## 2020-03-09 DIAGNOSIS — Z9989 Dependence on other enabling machines and devices: Secondary | ICD-10-CM | POA: Diagnosis not present

## 2020-03-09 DIAGNOSIS — Z794 Long term (current) use of insulin: Secondary | ICD-10-CM | POA: Diagnosis not present

## 2020-03-09 DIAGNOSIS — R5383 Other fatigue: Secondary | ICD-10-CM | POA: Diagnosis not present

## 2020-03-09 DIAGNOSIS — F329 Major depressive disorder, single episode, unspecified: Secondary | ICD-10-CM | POA: Insufficient documentation

## 2020-03-09 DIAGNOSIS — I110 Hypertensive heart disease with heart failure: Secondary | ICD-10-CM | POA: Insufficient documentation

## 2020-03-09 DIAGNOSIS — I5032 Chronic diastolic (congestive) heart failure: Secondary | ICD-10-CM | POA: Insufficient documentation

## 2020-03-09 DIAGNOSIS — Z8249 Family history of ischemic heart disease and other diseases of the circulatory system: Secondary | ICD-10-CM | POA: Diagnosis not present

## 2020-03-09 DIAGNOSIS — J069 Acute upper respiratory infection, unspecified: Secondary | ICD-10-CM | POA: Diagnosis not present

## 2020-03-09 DIAGNOSIS — Z7982 Long term (current) use of aspirin: Secondary | ICD-10-CM | POA: Diagnosis not present

## 2020-03-09 DIAGNOSIS — G40909 Epilepsy, unspecified, not intractable, without status epilepticus: Secondary | ICD-10-CM | POA: Diagnosis not present

## 2020-03-09 DIAGNOSIS — Z8616 Personal history of COVID-19: Secondary | ICD-10-CM | POA: Diagnosis not present

## 2020-03-09 LAB — CBC
HCT: 29.8 % — ABNORMAL LOW (ref 36.0–46.0)
Hemoglobin: 8.8 g/dL — ABNORMAL LOW (ref 12.0–15.0)
MCH: 21.2 pg — ABNORMAL LOW (ref 26.0–34.0)
MCHC: 29.5 g/dL — ABNORMAL LOW (ref 30.0–36.0)
MCV: 71.8 fL — ABNORMAL LOW (ref 80.0–100.0)
Platelets: 391 10*3/uL (ref 150–400)
RBC: 4.15 MIL/uL (ref 3.87–5.11)
RDW: 17.5 % — ABNORMAL HIGH (ref 11.5–15.5)
WBC: 5 10*3/uL (ref 4.0–10.5)
nRBC: 0 % (ref 0.0–0.2)

## 2020-03-09 LAB — BASIC METABOLIC PANEL
Anion gap: 9 (ref 5–15)
BUN: 13 mg/dL (ref 8–23)
CO2: 29 mmol/L (ref 22–32)
Calcium: 9.2 mg/dL (ref 8.9–10.3)
Chloride: 99 mmol/L (ref 98–111)
Creatinine, Ser: 0.97 mg/dL (ref 0.44–1.00)
GFR calc Af Amer: >60 mL/min (ref >60)
GFR calc non Af Amer: 59 mL/min — ABNORMAL LOW (ref >60)
Glucose, Bld: 83 mg/dL (ref 70–99)
Potassium: 4 mmol/L (ref 3.5–5.1)
Sodium: 137 mmol/L (ref 135–145)

## 2020-03-09 LAB — BRAIN NATRIURETIC PEPTIDE: B Natriuretic Peptide: 131.8 pg/mL — ABNORMAL HIGH (ref 0.0–100.0)

## 2020-03-09 MED ORDER — TRUE METRIX LEVEL 1 LOW VI SOLN: 2 refills | Status: DC

## 2020-03-09 NOTE — Patient Instructions (Addendum)
It was great to see you today! No medication changes are needed at this time.  Labs today We will only contact you if something comes back abnormal or we need to make some changes. Otherwise no news is good news!  Your physician has requested that you have an echocardiogram. Echocardiography is a painless test that uses sound waves to create images of your heart. It provides your doctor with information about the size and shape of your heart and how well your heart's chambers and valves are working. This procedure takes approximately one hour. There are no restrictions for this procedure.   Your physician recommends that you schedule a follow-up appointment in: 3 months with Dr McLean  If you have any questions or concerns before your next appointment please send us a message through mychart or call our office at [phone].    TO LEAVE A MESSAGE FOR THE NURSE SELECT OPTION 2, PLEASE LEAVE A MESSAGE INCLUDING: . YOUR NAME . DATE OF BIRTH . CALL BACK NUMBER . REASON FOR CALL**this is important as we prioritize the call backs  YOU WILL RECEIVE A CALL BACK THE SAME DAY AS LONG AS YOU CALL BEFORE 4:00 PM   

## 2020-03-09 NOTE — Addendum Note (Signed)
Addended by: Satira Sark D on: 03/09/2020 03:26 PM   Modules accepted: Orders

## 2020-03-09 NOTE — Progress Notes (Addendum)
TORIAN THOENNES    [id number]    [birth year]  Primary Care Physician:Sanders, Bailey Mech, MD  Referring Physician: [name], MD [address] [place],  [place] [address]  Chief complaint: Consult for post COVID-62  HPI: 74 year old with OSA.  Follows with Dr. Elsworth Soho Developed COVID-19 at the end of August.  Treated with monoclonal antibody on 02/23/2020. She was treated at home with remote health with dexamethasone, azithromycin, supplemental oxygen. She had a rough first week but feels that she is slowly improving.  Continues to have cough with white mucus  Currently at 1 L supplemental oxygen  Pets: Has dogs, no cats, birds, farm animals Occupation: Retired Psychologist, counselling Exposures: No known exposures.  No mold, hot tub, Jacuzzi Smoking history: 13-pack-year smoker.  Quit in 1990 Travel history: Previously lived in [place].  No significant recent travel Relevant family history: No significant family history of lung disease   Outpatient Encounter Medications as of 03/09/2020  Medication Sig  . acetaminophen (TYLENOL) 500 MG tablet Take 500-1,000 mg by mouth every 4 (four) hours as needed for moderate pain.   Marland Kitchen albuterol (VENTOLIN HFA) 108 (90 Base) MCG/ACT inhaler Inhale 1-2 puffs into the lungs every 6 (six) hours as needed for wheezing or shortness of breath.  . Alcohol Swabs (ALCOHOL PADS) 70 % PADS Use as directed dx: e11.22  . aspirin 81 MG chewable tablet Chew 1 tablet (81 mg total) by mouth daily.  Marland Kitchen atorvastatin (LIPITOR) 10 MG tablet Take 1 tablet (10 mg total) by mouth every evening.  . B Complex-C (B-COMPLEX WITH VITAMIN C) tablet Take 1 tablet by mouth daily.  . B-D ULTRAFINE III SHORT PEN 31G X 8 MM MISC USE DAILY WITH VICTOZA AND/OR NOVOLOG.  . Blood Glucose Calibration (TRUE METRIX LEVEL 1) Low SOLN Use as directed  Dx:e11.22  . Blood Glucose Monitoring Suppl (TRUE METRIX METER) w/Device KIT Use as directed to check blood sugars 2 times per  day dx: e11.22  . carvedilol (COREG) 25 MG tablet TAKE 1 TABLET BY MOUTH TWICE DAILY WITH MEALS  . Cholecalciferol (VITAMIN D-3) 1000 units CAPS Take 1,000 Units by mouth daily.  Marland Kitchen dexlansoprazole (DEXILANT) 60 MG capsule Take 1 capsule (60 mg total) by mouth daily.  . diclofenac sodium (VOLTAREN) 1 % GEL Apply 2 g topically 4 (four) times daily as needed (pain).  . furosemide (LASIX) 80 MG tablet Take 80 mg by mouth daily. 54m daily Monday-Friday, 446mon Saturday and Sunday  . glucose blood (TRUE METRIX BLOOD GLUCOSE TEST) test strip Use as directed to check blood sugars 2 times per day dx: e11.22  . hydrALAZINE (APRESOLINE) 25 MG tablet Take 3 tablets (75 mg total) by mouth 3 (three) times daily. (Patient taking differently: Take 75 mg by mouth in the morning and at bedtime. )  . hydrocortisone cream 1 % Apply 1 application topically daily as needed for itching.   . insulin lispro (HUMALOG KWIKPEN) 100 UNIT/ML KwikPen Inject 0.02-0.04 mLs (2-4 Units total) into the skin 3 (three) times daily as needed (high blood sugar over 150).  . Isopropyl Alcohol (ALCOHOL WIPES) 70 % MISC Apply 1 each topically 2 (two) times daily.  . isosorbide mononitrate (IMDUR) 30 MG 24 hr tablet Take 1 tablet (30 mg total) by mouth daily.  . Marland KitchenamoTRIgine (LAMICTAL) 150 MG tablet Take 1 tablet (150 mg total) by mouth 2 (two) times daily.  . Marland KitchenevETIRAcetam (KEPPRA) 500 MG tablet 2 tablets at bedtime  .  Menthol, Topical Analgesic, (STOPAIN ROLL-ON EX) Apply 1 application topically daily as needed (pain).  . Multiple Vitamins-Minerals (MULTIVITAMIN WITH MINERALS) tablet Take 1 tablet by mouth daily.  Marland Kitchen neomycin-bacitracin-polymyxin (NEOSPORIN) ointment Apply 1 application topically as needed for wound care.   Marland Kitchen OZEMPIC, 1 MG/DOSE, 4 MG/3ML SOPN INJECT 1MG INTO THE SKIN ONCE A WEEK  . Pancrelipase, Lip-Prot-Amyl, (ZENPEP) 25000-79000 units CPEP Take 1-2 capsules by mouth See admin instructions. Take 2 capsules in the morning,  1 capsule with lunch, and 2 capsules in the evening.  . polyethylene glycol (MIRALAX / GLYCOLAX) 17 g packet Take 17 g by mouth daily as needed for moderate constipation or severe constipation.  . potassium chloride SA (KLOR-CON) 20 MEQ tablet TAKE 2 TABLETS EVERY DAY BUT WHEN TAKING METOLAZONE TAKE 3 TABLETS DAILY AS DIRECTED  . sertraline (ZOLOFT) 50 MG tablet Take 1 tablet (50 mg total) by mouth daily.  Marland Kitchen tolnaftate (TINACTIN) 1 % cream Apply 1 application topically daily as needed (foot fungus).   [name] SOLOSTAR 300 UNIT/ML Solostar Pen INJECT 36 UNITS INTO THE SKIN AT BEDTIME. (Patient taking differently: 34 Units. )   No facility-administered encounter medications on file as of 03/09/2020.    Allergies as of 03/09/2020 - Review Complete 03/09/2020  Allergen Reaction Noted  . Other Anaphylaxis and Rash 05/18/2012  . Penicillins Hives 10/26/2010  . Sulfa antibiotics Hives 10/26/2010  . Aspirin Other (See Comments) 07/12/2017  . Codeine  10/26/2010    Past Medical History:  Diagnosis Date  . Abnormal liver function     in the past.  . Anemia   . Arthritis    all over  . Bruises easily   . Cataract    right eye;immature  . Chronic back pain    stenosis  . Chronic cough   . Chronic kidney disease   . COPD (chronic obstructive pulmonary disease) ([place])   . Demand myocardial infarction [facility]) 2012   Demand Infarction in setting of Pancreatitis --> mild Troponin elevation, NON-OBSTRUCTIVE CAD  . Depression    takes Abilify daily as well as Zoloft  . Diastolic heart failure 8250   Grade 1 diastolic Dysfunction by Echo   . Diverticulosis   . DM (diabetes mellitus) ([place])    takes Victoza daily as well as Lantus and Humalog  . Empty sella ([place])    on MRI in 2009.  . Glaucoma   . Headache(784.0)    last migraine-4-61yr ago  . History of blood transfusion    no abnormal reaction noted  . History of colon polyps    benign  . HTN (hypertension)    takes BSouthwest Airlinesdaily  . Hyperlipidemia    takes Lipitor daily  . Joint swelling   . Nocturia   . Obesity hypoventilation syndrome ([place]   . Obstructive sleep apnea 02/2018   Notably improved split-night study with weight loss from 270 (2017) down to 250 pounds (2019).  . Pancreatitis    takes Pancrelipase daily  . Parkinson's disease ([place]    takes Sinemet daily  . Peripheral neuropathy   . Pneumonia 2012  . Seizures ([place]    takes Lamictal daily and Primidone nightly;last seizure 2wks ago  . Urinary urgency    With increased frequency  . Varicose veins of both lower extremities with pain    With edema.  Takes daily Lasix    Past Surgical History:  Procedure Laterality Date  . ABDOMINAL HYSTERECTOMY    . APPENDECTOMY    .  BACK SURGERY    . Cardiac Event Monitor  September-October 2017   Sinus rhythm with occasional PACs and artifact. No arrhythmias besides one short run of tachycardia.  . CHOLECYSTECTOMY    . COLONOSCOPY N/A 08/15/2012   Procedure: COLONOSCOPY;  Surgeon: Arta Silence, MD;  Location: [facility];  Service: Endoscopy;  Laterality: N/A;  . COLONOSCOPY WITH PROPOFOL Left 06/17/2017   Procedure: COLONOSCOPY WITH PROPOFOL;  Surgeon: [name], MD;  Location: [place];  Service: Gastroenterology;  Laterality: Left;  . CORONARY CALCIUM SCORE AND CTA  04/2018   Coronary calcium score 71.9.  Very large, hyperdynamic LAD wrapping apex giving rise to PDA.  Moderate LAD-diagonal and circumflex plaque -> CT FFR suggested positive findings in distal D1, D2 and distal circumflex.  Referred for cath. ==> FALSE POSITIVE  . ESOPHAGOGASTRODUODENOSCOPY    . eye cysts Bilateral   . LASIK    . LEFT HEART CATH AND CORONARY ANGIOGRAPHY N/A 05/16/2018   Procedure: LEFT HEART CATH AND CORONARY ANGIOGRAPHY;  Surgeon: Belva Crome, MD;  Location: [facility] INVASIVE CV LAB:  Angiographically normal coronary arteries with LVEDP of 21 mmHg.  Large draping hyperdominant LAD that wraps the apex  and provides distal half of the PDA.  Relatively small caliber distal Cx -->proxLPDA, non-dom RCA. No Cx or Diag lesions -- FALSE + CT FFR  . LEFT HEART CATH AND CORONARY ANGIOGRAPHY  2009/March 2012   2009: (Dr. Alvie Heidelberg) Nonobstructive CAD; 2012: Minimal CAD --> false positive stress test  . NM MYOVIEW LTD  01/2016; 01/2018   a)LOW RISK. No ischemia or infarction;; b) EF 55-60%. NO ST changes.  No ischemia or Infarction. NO significant RV enlargement.  LOW RISK.  Marland Kitchen Polysomnogram  02/2018   (Dr. Brett Fairy from neurology): Split study was not adequately done due to low AHI.  She slept reclined and had an AHI less than 10, prolonged hypoxemia and no hypercapnia. -->  She has home oxygen already prescribed, but did not meet criteria for nightly oxygen.  (Was noted that the patient did not cooperate well with study.  Titration was discussed.  Marland Kitchen PRESSURE SENSOR/CARDIOMEMS N/A 11/09/2019   Procedure: PRESSURE SENSOR/CARDIOMEMS;  Surgeon: Larey Dresser, MD;  Location: [facility];  Service: Cardiovascular;  Laterality: N/A;  . RECTAL POLYPECTOMY    . RIGHT/LEFT HEART CATH AND CORONARY ANGIOGRAPHY N/A 04/06/2019   Procedure: RIGHT/LEFT HEART CATH AND CORONARY ANGIOGRAPHY;  Surgeon: Leonie Man, MD;  Location: [facility] INVASIVE CV LAB; angiographically normal coronary arteries.  Moderately elevated AD LVEDP..  Mild pulmonary hypertension: PA P 56/14 mmHg-mean 31 mmHg.  RAP 10 mmHg.  RV P-EDP 54/4 mmHg - 12 mmHg.  PCWP 11-15 mmHg.  CO-CI: 7.35-3.73.  . TONSILLECTOMY    . TRANSTHORACIC ECHOCARDIOGRAM  01/2016; 05/2017   A) Normal LV chamber size with mod LVH pattern. EF 50-55%. Severe LA dilation. Mod RA dilation. PAP elevated at 38 mmHg;; B) Mild concentric LVH.  EF 55-60% & no RWMA.  Grade 2 DD.  Diastolic flattening of the ventricular septum == ? elevated PAP.  Mild aortic valve calcification/sclerosis.  Mod LA dilation.  Mild RV dilation & Mod RA dilation  . TRANSTHORACIC ECHOCARDIOGRAM  10/2017;  01/2018   a) EF 55-60%.  No RWMA,  Moderate LA dilation.  Mild LA dilation.  Peak PA pressures ~57 mmHg (moderate);;; b) EF 55-60%.  GRII DD.  Suggestion of RV volume overload with moderate RV dilation.  Severe LA and RA dilation.Marland Kitchen  PA pressure ~67%.  Marland Kitchen  TRANSTHORACIC ECHOCARDIOGRAM  01/2019   Normal LV size and function.  EF 50 to 55%.  Impaired relaxation.  RV appears to have moderately reduced function.  Severely enlarged.  Cannot fully assess RV pressures.  Hypermobile interatrial septum.  Right atrium severely dilated.  . TUBAL LIGATION    . vaginal cyst removed     several times    Family History  Problem Relation Age of Onset  . Allergies Father   . Heart disease Father        before 27  . Heart failure Father   . Hypertension Father   . Hyperlipidemia Father   . Heart disease Mother   . Diabetes Mother   . Hypertension Mother   . Hyperlipidemia Mother   . Hypertension Sister   . Heart disease Sister        before 52  . Hyperlipidemia Sister   . Diabetes Brother   . Hypertension Brother   . Diabetes Sister   . Hypertension Sister   . Diabetes Son   . Hypertension Son   . Cancer Other     Social History   Socioeconomic History  . Marital status: Divorced    Spouse name: Not on file  . Number of children: Not on file  . Years of education: Not on file  . Highest education level: Not on file  Occupational History  . Occupation: Disabled    Comment: CNA  Tobacco Use  . Smoking status: Former Smoker    Packs/day: 0.50    Years: 25.00    Pack years: 12.50    Types: Cigarettes  . Smokeless tobacco: Never Used  . Tobacco comment: quit smoking 20+ytrs ago  Vaping Use  . Vaping Use: Never used  Substance and Sexual Activity  . Alcohol use: No  . Drug use: No  . Sexual activity: Not Currently    Birth control/protection: Surgical  Other Topics Concern  . Not on file  Social History Narrative   She lives in [place]. She has lots of family in the area and is  accompanied by her sister.   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   She has a distant history of smoking, quit 20 years ago.   Social Determinants of Health   Financial Resource Strain: Low Risk   . Difficulty of Paying Living Expenses: Not hard at all  Food Insecurity: No Food Insecurity  . Worried About Programme researcher, broadcasting/film/video in the Last Year: Never true  . Ran Out of Food in the Last Year: Never true  Transportation Needs: No Transportation Needs  . Lack of Transportation (Medical): No  . Lack of Transportation (Non-Medical): No  Physical Activity: Inactive  . Days of Exercise per Week: 0 days  . Minutes of Exercise per Session: 0 min  Stress: No Stress Concern Present  . Feeling of Stress : Not at all  Social Connections:   . Frequency of Communication with Friends and Family: Not on file  . Frequency of Social Gatherings with Friends and Family: Not on file  . Attends Religious Services: Not on file  . Active Member of Clubs or Organizations: Not on file  . Attends Banker Meetings: Not on file  . Marital Status: Not on file  Intimate Partner Violence:   . Fear of Current or Ex-Partner: Not on file  . Emotionally Abused: Not on file  . Physically Abused: Not on file  . Sexually Abused: Not  on file    Review of systems: Review of Systems  Constitutional: Negative for fever and chills.  HENT: Negative.   Eyes: Negative for blurred vision.  Respiratory: as per HPI  Cardiovascular: Negative for chest pain and palpitations.  Gastrointestinal: Negative for vomiting, diarrhea, blood per rectum. Genitourinary: Negative for dysuria, urgency, frequency and hematuria.  Musculoskeletal: Negative for myalgias, back pain and joint pain.  Skin: Negative for itching and rash.  Neurological: Negative for dizziness, tremors, focal weakness, seizures and loss of consciousness.  Endo/Heme/Allergies: Negative for environmental allergies.    Psychiatric/Behavioral: Negative for depression, suicidal ideas and hallucinations.  All other systems reviewed and are negative.  Physical Exam: Blood pressure 122/78, pulse 70, temperature 98.4 F (36.9 C), temperature source Oral, height _0  (1.549 m), weight 217 lb 9.6 oz (98.7 kg), SpO2 90 %. Gen:      No acute distress HEENT:  EOMI, sclera anicteric Neck:     No masses; no thyromegaly Lungs:    Clear to auscultation bilaterally; normal respiratory effort CV:         Regular rate and rhythm; no murmurs Abd:      + bowel sounds; soft, non-tender; no palpable masses, no distension Ext:    No edema; adequate peripheral perfusion Skin:      Warm and dry; no rash Neuro: alert and oriented x 3 Psych: normal mood and affect  Data Reviewed: Imaging: Chest x-ray 08/01/2019-low lung volumes with bibasal atelectasis, borderline cardiomegaly.  I have reviewed the images personally.  PFTs: 10/26/2019 FVC 1.52 [75%], FEV1 1.29 [83%], F/F 85, TLC 3.77 [81%], DLCO 16.54 [94%] Normal test  Labs:  Assessment:  Post COVID-19 Treated with monoclonal antibody, steroids and azithromycin Making gradual recovery She did desat on exertion today and will continue the supplemental oxygen Order portable concentrator to help with mobility  Get chest x-ray today Schedule PFTs 3 months from now reevaluation of the lung.  Prefer to wait for some time until she recovers from her acute infection.  OSA Continue CPAP.  Follows with Dr. Elsworth Soho  Plan/Recommendations: Chest x-ray Supplemental oxygen with portable concentrator PFTs in 3 months  Marshell Garfinkel MD [facility] 03/09/2020, 2:31 PM  CC: [name], MD

## 2020-03-09 NOTE — Patient Instructions (Signed)
We will get a chest x-ray today We will check your oxygen levels on exertion  Follow-up in 3 months with PFTs.

## 2020-03-09 NOTE — Progress Notes (Signed)
PCP: [name], MD Cardiology: Dr. Ellyn Hack HF Cardiology: Dr. [name]  Reason for visit: Follow-up for chronic diastolic heart failure  74 y.o. with history of OHS/OSA and RV dysfunction initially referred by Dr. Ellyn Hack for evaluation of CHF.  She has a history of OSA/OHS.  She uses CPAP at night but not currently using oxygen during the day.  Remote smoker.  She had an echo in 9/20 showing EF 50-55% but moderate-severe RV dysfunction with mild-moderate RV dilation.  RHC/LHC in 10/20 showed normal coronaries, pulmonary venous hypertension. Finally, she has a seizure disorder and has had multiple seizures in the last month.   She was recently evaluated for syncope and wore a heart monitor which showed no significant arrhythmia.  Last month, 5/21, she was admitted for placement of CardioMEMS and while admitted had another episode of loss of consciousness with jerking that was witnessed.  This was not associated with an arrhythmia.  Given history of seizure disorder, this was suspected to be a seizure.  Neurology was consulted.  MRI was performed and showed evidence of prior CVA, nonacute. ASA added. EEG was negative.  Neurology recommended continuation of Keppra.  No changes in antielliptic medications.  They advised no driving, per [place] DMV statutes.   During placement of pulmonary artery catheter, right heart catheterization demonstrated moderate pulmonary hypertension, suspected to be who groups 2 and 3 in setting of chronic diastolic heart failure, OHS/OSA and heart cardiac output.  She had post hospital f/u in June and was doing well from CHF standpoint and had had no further seizures.   She now presents to clinic for evaluation for dyspnea after recently testing positive for COVID in late August, despite being fully vaccinated. She was treated at [facility] and received casirivimab\imdevimab infusion. She is starting to feel a little bit better but continues w/ lingering dyspnea  post covid. She also has f/u in pulmonology clinic today for post covid evaluation. She denies CP. O2 sats are stable on RA today. Volume status assessment appears stable. CardioMEMs report shows that she has been consistently below her diastolic PA goal (goal 23 mmHg), 21 mmHg today. Lung exam today is normal. No wheezing or rhonchi. Still struggles w/ some fatigue. Also being treated for IDA. Recent CBC showed Hgb at 9.1. Ferritin low at 12. Fe 32. Sats Ratio 8%. Her PCP recently started her on PO iron and she reports good compliance. She denies any further seizures. EKG shows NSR, 63 bpm. No ischemic abnormalities.     ECG (personally reviewed): Not performed today  Labs (10/20): K 4.1, creatinine 1.15, hgb 11.4 Labs (5/21): K 4.5, creatinine 1.01: Hgb 11.2  PMH: 1. OHS/OSA: She uses CPAP.  2. H/o CVA 3. Seizure disorder 4. Type 2 DM 5. Depression 6. HTN 7. Hyperlipidemia 8. Chronic diastolic CHF: Echo (7/67) with EF 50-55%, moderate to severe RV dilation with mild-moderately decreased RV systolic function.  - RHC/LHC (10/20): normal coronaries; mean RA 10, PA 56/14 mean 31, mean PCWP 13, LVEDP 18, CO/CI 7.35/3.73, PVR 2.44.  9. Primarily pulmonary venous hypertension.   Social History   Socioeconomic History  . Marital status: Divorced    Spouse name: Not on file  . Number of children: Not on file  . Years of education: Not on file  . Highest education level: Not on file  Occupational History  . Occupation: Disabled    Comment: CNA  Tobacco Use  . Smoking status: Former Smoker    Packs/day: 0.50  Years: 25.00    Pack years: 12.50    Types: Cigarettes  . Smokeless tobacco: Never Used  . Tobacco comment: quit smoking 20+ytrs ago  Vaping Use  . Vaping Use: Never used  Substance and Sexual Activity  . Alcohol use: No  . Drug use: No  . Sexual activity: Not Currently    Birth control/protection: Surgical  Other Topics Concern  . Not on file  Social History Narrative    She lives in [place]. She has lots of family in the area and is accompanied by her sister.   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   She has a distant history of smoking, quit 20 years ago.   Social Determinants of Health   Financial Resource Strain: Low Risk   . Difficulty of Paying Living Expenses: Not hard at all  Food Insecurity: No Food Insecurity  . Worried About Charity fundraiser in the Last Year: Never true  . Ran Out of Food in the Last Year: Never true  Transportation Needs: No Transportation Needs  . Lack of Transportation (Medical): No  . Lack of Transportation (Non-Medical): No  Physical Activity: Inactive  . Days of Exercise per Week: 0 days  . Minutes of Exercise per Session: 0 min  Stress: No Stress Concern Present  . Feeling of Stress : Not at all  Social Connections:   . Frequency of Communication with Friends and Family: Not on file  . Frequency of Social Gatherings with Friends and Family: Not on file  . Attends Religious Services: Not on file  . Active Member of Clubs or Organizations: Not on file  . Attends Archivist Meetings: Not on file  . Marital Status: Not on file  Intimate Partner Violence:   . Fear of Current or Ex-Partner: Not on file  . Emotionally Abused: Not on file  . Physically Abused: Not on file  . Sexually Abused: Not on file   Family History  Problem Relation Age of Onset  . Allergies Father   . Heart disease Father        before 36  . Heart failure Father   . Hypertension Father   . Hyperlipidemia Father   . Heart disease Mother   . Diabetes Mother   . Hypertension Mother   . Hyperlipidemia Mother   . Hypertension Sister   . Heart disease Sister        before 3  . Hyperlipidemia Sister   . Diabetes Brother   . Hypertension Brother   . Diabetes Sister   . Hypertension Sister   . Diabetes Son   . Hypertension Son   . Cancer Other    Current Outpatient Medications   Medication Sig Dispense Refill  . acetaminophen (TYLENOL) 500 MG tablet Take 500-1,000 mg by mouth every 4 (four) hours as needed for moderate pain.     Marland Kitchen albuterol (VENTOLIN HFA) 108 (90 Base) MCG/ACT inhaler Inhale 1-2 puffs into the lungs every 6 (six) hours as needed for wheezing or shortness of breath.    . Alcohol Swabs (ALCOHOL PADS) 70 % PADS Use as directed dx: e11.22 300 each 2  . aspirin 81 MG chewable tablet Chew 1 tablet (81 mg total) by mouth daily.    Marland Kitchen atorvastatin (LIPITOR) 10 MG tablet Take 1 tablet (10 mg total) by mouth every evening. 90 tablet 2  . B Complex-C (B-COMPLEX WITH VITAMIN C) tablet Take 1 tablet by mouth daily.    Marland Kitchen  B-D ULTRAFINE III SHORT PEN 31G X 8 MM MISC USE DAILY WITH VICTOZA AND/OR NOVOLOG. 400 each 1  . Blood Glucose Calibration (TRUE METRIX LEVEL 1) Low SOLN Use as directed  Dx:e11.22 1 each 2  . Blood Glucose Monitoring Suppl (TRUE METRIX METER) w/Device KIT Use as directed to check blood sugars 2 times per day dx: e11.22 1 kit 1  . carvedilol (COREG) 25 MG tablet TAKE 1 TABLET BY MOUTH TWICE DAILY WITH MEALS 180 tablet 0  . Cholecalciferol (VITAMIN D-3) 1000 units CAPS Take 1,000 Units by mouth daily.    Marland Kitchen dexlansoprazole (DEXILANT) 60 MG capsule Take 1 capsule (60 mg total) by mouth daily. 30 capsule 0  . diclofenac sodium (VOLTAREN) 1 % GEL Apply 2 g topically 4 (four) times daily as needed (pain).    . furosemide (LASIX) 80 MG tablet Take 80 mg by mouth daily. 4m daily Monday-Friday, 437mon Saturday and Sunday    . glucose blood (TRUE METRIX BLOOD GLUCOSE TEST) test strip Use as directed to check blood sugars 2 times per day dx: e11.22 300 each 2  . hydrALAZINE (APRESOLINE) 25 MG tablet Take 3 tablets (75 mg total) by mouth 3 (three) times daily. (Patient taking differently: Take 75 mg by mouth in the morning and at bedtime. ) 270 tablet 3  . insulin lispro (HUMALOG KWIKPEN) 100 UNIT/ML KwikPen Inject 0.02-0.04 mLs (2-4 Units total) into the skin 3  (three) times daily as needed (high blood sugar over 150). 15 mL 2  . Isopropyl Alcohol (ALCOHOL WIPES) 70 % MISC Apply 1 each topically 2 (two) times daily. 300 each 3  . isosorbide mononitrate (IMDUR) 30 MG 24 hr tablet Take 1 tablet (30 mg total) by mouth daily. 30 tablet 11  . lamoTRIgine (LAMICTAL) 150 MG tablet Take 1 tablet (150 mg total) by mouth 2 (two) times daily. 180 tablet 3  . levETIRAcetam (KEPPRA) 500 MG tablet 2 tablets at bedtime 60 tablet 3  . Menthol, Topical Analgesic, (STOPAIN ROLL-ON EX) Apply 1 application topically daily as needed (pain).    . Multiple Vitamins-Minerals (MULTIVITAMIN WITH MINERALS) tablet Take 1 tablet by mouth daily.    . Marland Kitcheneomycin-bacitracin-polymyxin (NEOSPORIN) ointment Apply 1 application topically as needed for wound care.     . Marland KitchenZEMPIC, 1 MG/DOSE, 4 MG/3ML SOPN INJECT 1MG INTO THE SKIN ONCE A WEEK 9 mL 2  . Pancrelipase, Lip-Prot-Amyl, (ZENPEP) 25000-79000 units CPEP Take 1-2 capsules by mouth See admin instructions. Take 2 capsules in the morning, 1 capsule with lunch, and 2 capsules in the evening.    . polyethylene glycol (MIRALAX / GLYCOLAX) 17 g packet Take 17 g by mouth daily as needed for moderate constipation or severe constipation. 14 each 0  . potassium chloride SA (KLOR-CON) 20 MEQ tablet TAKE 2 TABLETS EVERY DAY BUT WHEN TAKING METOLAZONE TAKE 3 TABLETS DAILY AS DIRECTED 204 tablet 2  . sertraline (ZOLOFT) 50 MG tablet Take 1 tablet (50 mg total) by mouth daily. 90 tablet 2  . tolnaftate (TINACTIN) 1 % cream Apply 1 application topically daily as needed (foot fungus).     . Nelva NayOLOSTAR 300 UNIT/ML Solostar Pen INJECT 36 UNITS INTO THE SKIN AT BEDTIME. (Patient taking differently: 34 Units. ) 9 pen 2  . hydrocortisone cream 1 % Apply 1 application topically daily as needed for itching. (Patient not taking: Reported on 03/09/2020)     No current facility-administered medications for this encounter.   BP 140/78 (BP Location: Left Arm,  Patient Position: Sitting, Cuff Size: Normal)   Pulse 69   Ht 5' 1" (1.549 m)   Wt 99.4 kg (219 lb 3.2 oz)   SpO2 95%   BMI 41.42 kg/m    PHYSICAL EXAM: General:  Well appearing. No respiratory difficulty HEENT: normal Neck: supple. no JVD. Carotids 2+ bilat; no bruits. No lymphadenopathy or thyromegaly appreciated. Cor: PMI nondisplaced. Regular rate & rhythm. 2/6 murmur at R/LUSB Lungs: clear Abdomen: soft, nontender, nondistended. No hepatosplenomegaly. No bruits or masses. Good bowel sounds. Extremities: no cyanosis, clubbing, rash, edema Neuro: alert & oriented x 3, cranial nerves grossly intact. moves all 4 extremities w/o difficulty. Affect pleasant.    1. Chronic diastolic heart failure: Echo 5/21 with EF 60-65%, severely dilated RV. RHC/LHC in 10/20 showed mild-moderate pulmonary venous hypertension and no significant coronary disease. Cause of RV dysfunction is not clear, possibly due to diastolic CHF + OHS/OSA. V/Q scan in 2/21 did not show acute or chronic PE, PFTs in 5/21 were normal. RHC 5/21 showed normal right and left heart filling pressures, High output on RHC 5/17 but no evidence for intracardiac shunt on shunt run. -NYHA class II-III, notes increased fatigue post COVID infection 8/21 -Plan repeat Echo to reassess EF post viral infection  -Euvolemic on exam and by CardioMEMs reading, dPAP consistently below goal.    -Continue lasix 80 daily.  -Continue daily Cardiomems transmission  - Check BMP today  3. Pulmonary hypertension: Recent RHC 5/21 with moderate pulmonary hypertension, suspect group 2/group 3 in setting of diastolic CHF, OHS/OSA, and high cardiac output.   -There is no role for selective pulmonary vasodilators.  -Continue CPAP nightly 4. Hypertension - Controlled on current regimen  - Continue coreg 25 mg BID,   - Continue hydralazine 75 mg BID - Continue Imdur 30 daily  5. OHS/OSA: on CPAP but no oxygen 6. Seizure d/o: Has had both seizures and  pseudoseizures per notes. -Recent EEG 5/21 completed and negative  -Continue Keppra 500 mg twice daily -Continue Lamictal 150 mg twice daily - no driving per [place] DMV statutes  -Follow-up with neurology 7. Old CVA: Recent MRI MRI with evidence for prior CVA (nothing acute).  - Continue statin. -Continue ASA 81.   -Follow-up with neurology 8. COVID 19 infection: tested + 8/21 despite being fully vaccinated. She was treated at [facility] and received casirivimab\imdevimab infusion. She is starting to feel a little bit better but continues w/ lingering dyspnea post covid. She also has f/u in pulmonology clinic today for post covid evaluation. She denies CP. O2 sats are stable on RA today. - plan to repeat echo to ensure no drop in EF - suspect some of her fatigue may be from anemia as well 11. IDA:  - recent CBC w/ Hgb 9.1.  Ferritin low at 12. Fe 32. Sats Ratio 8%. Her PCP recently started her on PO iron and she reports good compliance - PCP will continue to follow   F/u with Dr. [name] in 3 months. Will bring back sooner if echo reveals change in LVEF.   [name] Jester, PA-C  03/09/2020

## 2020-03-10 NOTE — Chronic Care Management (AMB) (Signed)
Chronic Care Management   Follow Up Note   03/07/2020 Name: [name] MRN: [id number] DOB: 06/17/1946  Referred by: [name], MD Reason for referral : Chronic Care Management (FU RN CM Call)   [name] is a 74 y.o. year old female who is a primary care patient of [name], MD. The CCM team was consulted for assistance with chronic disease management and care coordination needs.    Review of patient status, including review of consultants reports, relevant laboratory and other test results, and collaboration with appropriate care team members and the patient's provider was performed as part of comprehensive patient evaluation and provision of chronic care management services.    SDOH (Social Determinants of Health) assessments performed: Yes See Care Plan activities for detailed interventions related to [name])   Placed outbound CCM RN CM call to patient sister and [name] for a COVID update.     Outpatient Encounter Medications as of 03/07/2020  Medication Sig  . acetaminophen (TYLENOL) 500 MG tablet Take 500-1,000 mg by mouth every 4 (four) hours as needed for moderate pain.   Marland Kitchen albuterol (VENTOLIN HFA) 108 (90 Base) MCG/ACT inhaler Inhale 1-2 puffs into the lungs every 6 (six) hours as needed for wheezing or shortness of breath.  . Alcohol Swabs (ALCOHOL PADS) 70 % PADS Use as directed dx: e11.22  . aspirin 81 MG chewable tablet Chew 1 tablet (81 mg total) by mouth daily.  Marland Kitchen atorvastatin (LIPITOR) 10 MG tablet Take 1 tablet (10 mg total) by mouth every evening.  . B Complex-C (B-COMPLEX WITH VITAMIN C) tablet Take 1 tablet by mouth daily.  . B-D ULTRAFINE III SHORT PEN 31G X 8 MM MISC USE DAILY WITH VICTOZA AND/OR NOVOLOG.  . Blood Glucose Monitoring Suppl (TRUE METRIX METER) w/Device KIT Use as directed to check blood sugars 2 times per day dx: e11.22  . carvedilol (COREG) 25 MG tablet TAKE 1 TABLET BY MOUTH TWICE DAILY WITH MEALS  . Cholecalciferol (VITAMIN  D-3) 1000 units CAPS Take 1,000 Units by mouth daily.  Marland Kitchen dexlansoprazole (DEXILANT) 60 MG capsule Take 1 capsule (60 mg total) by mouth daily.  . diclofenac sodium (VOLTAREN) 1 % GEL Apply 2 g topically 4 (four) times daily as needed (pain).  . furosemide (LASIX) 80 MG tablet Take 80 mg by mouth daily. 37m daily Monday-Friday, 433mon Saturday and Sunday  . glucose blood (TRUE METRIX BLOOD GLUCOSE TEST) test strip Use as directed to check blood sugars 2 times per day dx: e11.22  . hydrALAZINE (APRESOLINE) 25 MG tablet Take 3 tablets (75 mg total) by mouth 3 (three) times daily. (Patient taking differently: Take 75 mg by mouth in the morning and at bedtime. )  . hydrocortisone cream 1 % Apply 1 application topically daily as needed for itching.   . insulin lispro (HUMALOG KWIKPEN) 100 UNIT/ML KwikPen Inject 0.02-0.04 mLs (2-4 Units total) into the skin 3 (three) times daily as needed (high blood sugar over 150).  . Isopropyl Alcohol (ALCOHOL WIPES) 70 % MISC Apply 1 each topically 2 (two) times daily.  . isosorbide mononitrate (IMDUR) 30 MG 24 hr tablet Take 1 tablet (30 mg total) by mouth daily.  . Marland KitchenamoTRIgine (LAMICTAL) 150 MG tablet Take 1 tablet (150 mg total) by mouth 2 (two) times daily.  . Marland KitchenevETIRAcetam (KEPPRA) 500 MG tablet 2 tablets at bedtime  . Menthol, Topical Analgesic, (STOPAIN ROLL-ON EX) Apply 1 application topically daily as needed (pain).  . Multiple Vitamins-Minerals (MULTIVITAMIN WITH  MINERALS) tablet Take 1 tablet by mouth daily.  Marland Kitchen neomycin-bacitracin-polymyxin (NEOSPORIN) ointment Apply 1 application topically as needed for wound care.   Marland Kitchen OZEMPIC, 1 MG/DOSE, 4 MG/3ML SOPN INJECT $RemoveBef'1MG'[id number]$  INTO THE SKIN ONCE A WEEK  . Pancrelipase, Lip-Prot-Amyl, (ZENPEP) 25000-79000 units CPEP Take 1-2 capsules by mouth See admin instructions. Take 2 capsules in the morning, 1 capsule with lunch, and 2 capsules in the evening.  . polyethylene glycol (MIRALAX / GLYCOLAX) 17 g packet Take 17 g by  mouth daily as needed for moderate constipation or severe constipation.  . potassium chloride SA (KLOR-CON) 20 MEQ tablet TAKE 2 TABLETS EVERY DAY BUT WHEN TAKING METOLAZONE TAKE 3 TABLETS DAILY AS DIRECTED  . sertraline (ZOLOFT) 50 MG tablet Take 1 tablet (50 mg total) by mouth daily.  Marland Kitchen tolnaftate (TINACTIN) 1 % cream Apply 1 application topically daily as needed (foot fungus).   [name] SOLOSTAR 300 UNIT/ML Solostar Pen INJECT 36 UNITS INTO THE SKIN AT BEDTIME. (Patient taking differently: 34 Units. )  . [DISCONTINUED] Blood Glucose Calibration (TRUE METRIX LEVEL 1) Low SOLN Use as directed  Dx:e11.22   No facility-administered encounter medications on file as of 03/07/2020.     Objective:  Lab Results  Component Value Date   HGBA1C 7.6 (H) 11/09/2019   HGBA1C 8.0 (H) 08/24/2019   HGBA1C 7.5 (H) 05/19/2019   Lab Results  Component Value Date   MICROALBUR 10 01/06/2020   LDLCALC 65 12/17/2019   CREATININE 0.97 03/09/2020   BP Readings from Last 3 Encounters:  03/09/20 140/78  03/09/20 122/78  02/23/20 116/65    Goals Addressed      Patient Stated   .  "to recover from [facility] without complications" (pt-stated)        Sister and patient stated  CARE PLAN ENTRY (see longitudinal plan of care for additional care plan information)  Current Barriers:  Marland Kitchen Knowledge Deficits related to Care Coordination needs for monoclonal antibody infusion to minimize symptoms of COVID 19 . Chronic Disease Management support and education needs related to DM II, CHF, Seizure disorder   Nurse Case Manager Clinical Goal(s):  Marland Kitchen Over the next 3 days, patient will verbalize understanding of plan for receiving [employer]ener[employer] antibody infusion for COVID 19  Goal Met . 03/07/20 New Over the next 14 days, patient will be evaluated by MD and or hospitalist for evaluation of COVID related symptoms and hypoxia . 03/07/20 New Over the next 7 days, patient will have a Remote Health RN home visit scheduled for  evaluation and treatment for skilled nurse needs for supportive care of COVID symptoms   CCM RN CM Interventions:  02/22/20 call completed with sister [name] and patient  . Inter-disciplinary care team collaboration (see longitudinal plan of care) . Evaluation of current treatment plan related to COVID 19 and patient's adherence to plan as established by provider . Determined patient contracted COVID 19 approximately 7 days ago after being exposed to her brother in law with whom she lives who also contracted COVID 28 . Discussed assistance needed for Care Coordination in order for Ms. Urbas to receive the monoclonal antibody infusion to help minimize COVID 19 related symptoms . Determined Sandra's husband recently received this infusion for which his COVID 19 symptoms immediately improved . Determined Ms. Halder is experiencing persistent violent coughing, shortness of breath and intermittent chest pain . Determined her Oxygen level at home is currently 95% and her other vitals signs, BP, HR and temp are within normal range .  Collaborated with PCP provider Dr. [name] regarding patient's reported symptoms and positive COVID 19 per patient's sister and caregiver Sanra . Discussed with Dr. Baird Cancer that her office staff will outreach to [facility] infusion clinic to request this infusion for Ms. Fredderick Severance, Further discussed that Dr. Baird Cancer will order Remote Health Services to help monitor patient's condition related to COVID 19 . Informed the patient and her sister [name] of plans to request [facility] reach out to schedule Ms. Lok for the monoclonal antibody infusion and orders for Remote Health to visit the patient in her home to further assess her condition, patient and sister verbalize understanding  . Discussed plans with patient for ongoing care management follow up and provided patient with direct contact information for care management team 03/07/20 call completed with  patient and sister [name] . Determined patient's symptoms have not improved much since receiving the monoclonial antibody  . Discussed sister's concerns related to patient's oxygen saturation dropping into the 60's if her Oxygen is removed for only a few seconds . Determined with Oxygen the patient is sating at 94%, Determined patient's sister notified Cardiology and MD agreed to see the patient in the office this week . Determined patient is prescribed to be on Oxygen at 3 lts per nasal cannula and has an Oxygen concentrator in the home from [place] . Determined patient does not have a portable Oxygen tank at this time but is need of one for patient to attend f/u appointments scheduled this week with Cardiology and Pulmonology . Discussed sister has also noticed Ms. Argueta to experience some intermittent confusion that is not normal for her, determined her Oxygen level was around 94% when the confusion was noticed . Determined patient continues to have Remote Health in place for COVID support but this service is currently telephonic . Collaborated with Dr. Baird Cancer to report sister's concerns and reported symptoms, advised a new referral for Remote Health home services is needed . Discussed Dr. Baird Cancer supports patient's sister calling EMS to have patient transported to [facility] for further evaluation, sister notified and agreed   Patient Self Care Activities:  . Attends all scheduled provider appointments . Calls provider office for new concerns or questions . Supportive sister to assist with care needs  Please see past updates related to this goal by clicking on the "Past Updates" button in the selected goal        Plan:   Telephone follow up appointment with care management team member scheduled for: 04/04/20  Barb Merino, RN, BSN, CCM Care Management Coordinator [facility]  Direct Phone: [phone]

## 2020-03-10 NOTE — Patient Instructions (Signed)
Visit Information  Goals Addressed      Patient Stated   .  "to recover from [place] without complications" (pt-stated)        Sister and patient stated  CARE PLAN ENTRY (see longitudinal plan of care for additional care plan information)  Current Barriers:  Marland Kitchen Knowledge Deficits related to Care Coordination needs for monoclonal antibody infusion to minimize symptoms of COVID 19 . Chronic Disease Management support and education needs related to DM II, CHF, Seizure disorder   Nurse Case Manager Clinical Goal(s):  Marland Kitchen Over the next 3 days, patient will verbalize understanding of plan for receiving Regeneron antibody infusion for COVID 19  Goal Met . 03/07/20 New Over the next 14 days, patient will be evaluated by MD and or hospitalist for evaluation of COVID related symptoms and hypoxia . 03/07/20 New Over the next 7 days, patient will have a Remote Health RN home visit scheduled for evaluation and treatment for skilled nurse needs for supportive care of COVID symptoms   CCM RN CM Interventions:  02/22/20 call completed with sister [name] and patient  . Inter-disciplinary care team collaboration (see longitudinal plan of care) . Evaluation of current treatment plan related to COVID 19 and patient's adherence to plan as established by provider . Determined patient contracted COVID 19 approximately 7 days ago after being exposed to her brother in law with whom she lives who also contracted COVID 30 . Discussed assistance needed for Care Coordination in order for Ms. Goldfarb to receive the monoclonal antibody infusion to help minimize COVID 19 related symptoms . Determined Sandra's husband recently received this infusion for which his COVID 19 symptoms immediately improved . Determined Ms. Benassi is experiencing persistent violent coughing, shortness of breath and intermittent chest pain . Determined her Oxygen level at home is currently 95% and her other vitals signs, BP, HR and temp are within  normal range . Collaborated with PCP provider Dr. [name] regarding patient's reported symptoms and positive COVID 19 per patient's sister and caregiver Sanra . Discussed with Dr. Baird Cancer that her office staff will outreach to [facility]'s infusion clinic to request this infusion for Ms. Fredderick Severance, Further discussed that Dr. Baird Cancer will order Remote Health Services to help monitor patient's condition related to COVID 19 . Informed the patient and her sister [name] of plans to request [facility] reach out to schedule Ms. Sullenger for the monoclonal antibody infusion and orders for Remote Health to visit the patient in her home to further assess her condition, patient and sister verbalize understanding  . Discussed plans with patient for ongoing care management follow up and provided patient with direct contact information for care management team 03/07/20 call completed with patient and sister [name] . Determined patient's symptoms have not improved much since receiving the monoclonial antibody  . Discussed sister's concerns related to patient's oxygen saturation dropping into the 60's if her Oxygen is removed for only a few seconds . Determined with Oxygen the patient is sating at 94%, Determined patient's sister notified Cardiology and MD agreed to see the patient in the office this week . Determined patient is prescribed to be on Oxygen at 3 lts per nasal cannula and has an Oxygen concentrator in the home from [place] . Determined patient does not have a portable Oxygen tank at this time but is need of one for patient to attend f/u appointments scheduled this week with Cardiology and Pulmonology . Discussed sister has also noticed Ms. Pepper to experience  some intermittent confusion that is not normal for her, determined her Oxygen level was around 94% when the confusion was noticed . Determined patient continues to have Remote Health in place for COVID support but this service  is currently telephonic . Collaborated with Dr. Baird Cancer to report sister's concerns and reported symptoms, advised a new referral for Remote Health home services is needed . Discussed Dr. Baird Cancer supports patient's sister calling EMS to have patient transported to [facility] for further evaluation, sister notified and agreed   Patient Self Care Activities:  . Attends all scheduled provider appointments . Calls provider office for new concerns or questions . Supportive sister to assist with care needs  Please see past updates related to this goal by clicking on the "Past Updates" button in the selected goal        Patient verbalizes understanding of instructions provided today.   Telephone follow up appointment with care management team member scheduled for: 04/04/20  Barb Merino, RN, BSN, CCM Care Management Coordinator [place] [facility]  Direct Phone: [phone]

## 2020-03-10 NOTE — Patient Instructions (Addendum)
Visit Information  Goals Addressed      Patient Stated   .  "to recover from [place] without complications" (pt-stated)        Sister and patient stated  CARE PLAN ENTRY (see longitudinal plan of care for additional care plan information)  Current Barriers:  Marland Kitchen Knowledge Deficits related to Care Coordination needs for monoclonal antibody infusion to minimize symptoms of COVID 19 . Chronic Disease Management support and education needs related to DM II, CHF, Seizure disorder   Nurse Case Manager Clinical Goal(s):  Marland Kitchen Over the next 3 days, patient will verbalize understanding of plan for receiving Regeneron antibody infusion for COVID 19  Goal Met . 03/07/20 New Over the next 14 days, patient will be evaluated by MD and or hospitalist for evaluation of COVID related symptoms and hypoxia . 03/07/20 New Over the next 7 days, patient will have a Remote Health RN home visit scheduled for evaluation and treatment for skilled nurse needs for supportive care of COVID symptoms   CCM RN CM Interventions:  02/22/20 call completed with sister [name] and patient  . Inter-disciplinary care team collaboration (see longitudinal plan of care) . Evaluation of current treatment plan related to COVID 19 and patient's adherence to plan as established by provider . Determined patient contracted COVID 19 approximately 7 days ago after being exposed to her brother in law with whom she lives who also contracted COVID 30 . Discussed assistance needed for Care Coordination in order for [name] to receive the monoclonal antibody infusion to help minimize COVID 19 related symptoms . Determined [name]'s husband recently received this infusion for which his COVID 19 symptoms immediately improved . Determined [name] is experiencing persistent violent coughing, shortness of breath and intermittent chest pain . Determined her Oxygen level at home is currently 95% and her other vitals signs, BP, HR and temp are within  normal range . Collaborated with PCP provider Dr. [name] regarding patient's reported symptoms and positive COVID 19 per patient's sister and caregiver [name] . Discussed with Dr. Baird Cancer that her office staff will outreach to [facility]'s infusion clinic to request this infusion for [name], Further discussed that Dr. Baird Cancer will order Remote Health Services to help monitor patient's condition related to COVID 19 . Informed the patient and her sister [name] of plans to request [facility] reach out to schedule [name] for the monoclonal antibody infusion and orders for Remote Health to visit the patient in her home to further assess her condition, patient and sister verbalize understanding  . Discussed plans with patient for ongoing care management follow up and provided patient with direct contact information for care management team 03/07/20 call completed with patient and sister [name] . Determined patient's symptoms have not improved much since receiving the monoclonial antibody  . Discussed sister's concerns related to patient's oxygen saturation dropping into the 60's if her Oxygen is removed for only a few seconds . Determined with Oxygen the patient is sating at 94%, Determined patient's sister notified Cardiology and MD agreed to see the patient in the office this week . Determined patient is prescribed to be on Oxygen at 3 lts per nasal cannula and has an Oxygen concentrator in the home from [place] . Determined patient does not have a portable Oxygen tank at this time but is need of one for patient to attend f/u appointments scheduled this week with Cardiology and Pulmonology . Discussed sister has also noticed [name] to experience  some intermittent confusion that is not normal for her, determined her Oxygen level was around 94% when the confusion was noticed . Determined patient continues to have Remote Health in place for COVID support but this service  is currently telephonic . Collaborated with Dr. Baird Cancer to report sister's concerns and reported symptoms, advised a new referral for Remote Health home services is needed . Discussed Dr. Baird Cancer supports patient's sister calling EMS to have patient transported to [facility] for further evaluation, sister notified and agreed  03/08/20 Placed outbound call to [place] RN requesting a home visit be scheduled  . Determined a new Remote Health referral is needed by PCP in order to transition patient from [place] support to home base support  Patient Self Care Activities:  . Attends all scheduled provider appointments . Calls provider office for new concerns or questions . Supportive sister to assist with care needs  Please see past updates related to this goal by clicking on the "Past Updates" button in the selected goal        Patient verbalizes understanding of instructions provided today.   Telephone follow up appointment with care management team member scheduled for: 04/04/20  Barb Merino, RN, BSN, CCM Care Management Coordinator [place] [facility]  Direct Phone: [phone]

## 2020-03-10 NOTE — Chronic Care Management (AMB) (Addendum)
Chronic Care Management   Follow Up Note   03/08/2020 Name: NAN MAYA MRN: [id number] DOB: [birth year]  Referred by: [name], MD Reason for referral : Chronic Care Management (CCM RN FU w/healthcare provider )   MONIFAH FREEHLING is a 74 y.o. year old female who is a primary care patient of [name], MD. The CCM team was consulted for assistance with chronic disease management and care coordination needs.    Review of patient status, including review of consultants reports, relevant laboratory and other test results, and collaboration with appropriate care team members and the patient's provider was performed as part of comprehensive patient evaluation and provision of chronic care management services.    SDOH (Social Determinants of Health) assessments performed: No See Care Plan activities for detailed interventions related to [place])   Placed outbound CCM RN CM call to patient and sister for an update on patient's progression toward wellness following COVID-19 antibody infusion.    Outpatient Encounter Medications as of 03/08/2020  Medication Sig  . acetaminophen (TYLENOL) 500 MG tablet Take 500-1,000 mg by mouth every 4 (four) hours as needed for moderate pain.   Marland Kitchen albuterol (VENTOLIN HFA) 108 (90 Base) MCG/ACT inhaler Inhale 1-2 puffs into the lungs every 6 (six) hours as needed for wheezing or shortness of breath.  . Alcohol Swabs (ALCOHOL PADS) 70 % PADS Use as directed dx: e11.22  . aspirin 81 MG chewable tablet Chew 1 tablet (81 mg total) by mouth daily.  Marland Kitchen atorvastatin (LIPITOR) 10 MG tablet Take 1 tablet (10 mg total) by mouth every evening.  . B Complex-C (B-COMPLEX WITH VITAMIN C) tablet Take 1 tablet by mouth daily.  . B-D ULTRAFINE III SHORT PEN 31G X 8 MM MISC USE DAILY WITH VICTOZA AND/OR NOVOLOG.  . Blood Glucose Monitoring Suppl (TRUE METRIX METER) w/Device KIT Use as directed to check blood sugars 2 times per day dx: e11.22  . carvedilol (COREG) 25 MG  tablet TAKE 1 TABLET BY MOUTH TWICE DAILY WITH MEALS  . Cholecalciferol (VITAMIN D-3) 1000 units CAPS Take 1,000 Units by mouth daily.  Marland Kitchen dexlansoprazole (DEXILANT) 60 MG capsule Take 1 capsule (60 mg total) by mouth daily.  . diclofenac sodium (VOLTAREN) 1 % GEL Apply 2 g topically 4 (four) times daily as needed (pain).  . furosemide (LASIX) 80 MG tablet Take 80 mg by mouth daily. 69m daily Monday-Friday, 455mon Saturday and Sunday  . glucose blood (TRUE METRIX BLOOD GLUCOSE TEST) test strip Use as directed to check blood sugars 2 times per day dx: e11.22  . hydrALAZINE (APRESOLINE) 25 MG tablet Take 3 tablets (75 mg total) by mouth 3 (three) times daily. (Patient taking differently: Take 75 mg by mouth in the morning and at bedtime. )  . hydrocortisone cream 1 % Apply 1 application topically daily as needed for itching.   . insulin lispro (HUMALOG KWIKPEN) 100 UNIT/ML KwikPen Inject 0.02-0.04 mLs (2-4 Units total) into the skin 3 (three) times daily as needed (high blood sugar over 150).  . Isopropyl Alcohol (ALCOHOL WIPES) 70 % MISC Apply 1 each topically 2 (two) times daily.  . isosorbide mononitrate (IMDUR) 30 MG 24 hr tablet Take 1 tablet (30 mg total) by mouth daily.  . Marland KitchenamoTRIgine (LAMICTAL) 150 MG tablet Take 1 tablet (150 mg total) by mouth 2 (two) times daily.  . Marland KitchenevETIRAcetam (KEPPRA) 500 MG tablet 2 tablets at bedtime  . Menthol, Topical Analgesic, (STOPAIN ROLL-ON EX) Apply 1 application topically daily  as needed (pain).  . Multiple Vitamins-Minerals (MULTIVITAMIN WITH MINERALS) tablet Take 1 tablet by mouth daily.  Marland Kitchen neomycin-bacitracin-polymyxin (NEOSPORIN) ointment Apply 1 application topically as needed for wound care.   Marland Kitchen OZEMPIC, 1 MG/DOSE, 4 MG/3ML SOPN INJECT 1MG INTO THE SKIN ONCE A WEEK  . Pancrelipase, Lip-Prot-Amyl, (ZENPEP) 25000-79000 units CPEP Take 1-2 capsules by mouth See admin instructions. Take 2 capsules in the morning, 1 capsule with lunch, and 2 capsules in the  evening.  . polyethylene glycol (MIRALAX / GLYCOLAX) 17 g packet Take 17 g by mouth daily as needed for moderate constipation or severe constipation.  . potassium chloride SA (KLOR-CON) 20 MEQ tablet TAKE 2 TABLETS EVERY DAY BUT WHEN TAKING METOLAZONE TAKE 3 TABLETS DAILY AS DIRECTED  . sertraline (ZOLOFT) 50 MG tablet Take 1 tablet (50 mg total) by mouth daily.  Marland Kitchen tolnaftate (TINACTIN) 1 % cream Apply 1 application topically daily as needed (foot fungus).   [name] SOLOSTAR 300 UNIT/ML Solostar Pen INJECT 36 UNITS INTO THE SKIN AT BEDTIME. (Patient taking differently: 34 Units. )  . [DISCONTINUED] Blood Glucose Calibration (TRUE METRIX LEVEL 1) Low SOLN Use as directed  Dx:e11.22   No facility-administered encounter medications on file as of 03/08/2020.     Objective:  Lab Results  Component Value Date   HGBA1C 7.6 (H) 11/09/2019   HGBA1C 8.0 (H) 08/24/2019   HGBA1C 7.5 (H) 05/19/2019   Lab Results  Component Value Date   MICROALBUR 10 01/06/2020   LDLCALC 65 12/17/2019   CREATININE 0.97 03/09/2020   BP Readings from Last 3 Encounters:  03/09/20 140/78  03/09/20 122/78  02/23/20 116/65    Goals Addressed      Patient Stated   .  "to recover from [place] without complications" (pt-stated)        Sister and patient stated  CARE PLAN ENTRY (see longitudinal plan of care for additional care plan information)  Current Barriers:  Marland Kitchen Knowledge Deficits related to Care Coordination needs for monoclonal antibody infusion to minimize symptoms of COVID 19 . Chronic Disease Management support and education needs related to DM II, CHF, Seizure disorder   Nurse Case Manager Clinical Goal(s):  Marland Kitchen Over the next 3 days, patient will verbalize understanding of plan for receiving Regeneron antibody infusion for COVID 19  Goal Met . 03/07/20 New Over the next 14 days, patient will be evaluated by MD and or hospitalist for evaluation of COVID related symptoms and hypoxia . 03/07/20 New Over the  next 7 days, patient will have a Remote Health RN home visit scheduled for evaluation and treatment for skilled nurse needs for supportive care of COVID symptoms   CCM RN CM Interventions:  02/22/20 call completed with sister [name] and patient  . Inter-disciplinary care team collaboration (see longitudinal plan of care) . Evaluation of current treatment plan related to COVID 19 and patient's adherence to plan as established by provider . Determined patient contracted COVID 19 approximately 7 days ago after being exposed to her brother in law with whom she lives who also contracted COVID 77 . Discussed assistance needed for Care Coordination in order for Ms. Bastarache to receive the monoclonal antibody infusion to help minimize COVID 19 related symptoms . Determined Sandra's husband recently received this infusion for which his COVID 19 symptoms immediately improved . Determined Ms. Vondra is experiencing persistent violent coughing, shortness of breath and intermittent chest pain . Determined her Oxygen level at home is currently 95% and her other vitals signs,  BP, HR and temp are within normal range . Collaborated with PCP provider Dr. [name] regarding patient's reported symptoms and positive COVID 19 per patient's sister and caregiver Sanra . Discussed with Dr. Baird Cancer that her office staff will outreach to [facility] infusion clinic to request this infusion for Ms. Fredderick Severance, Further discussed that Dr. Baird Cancer will order Remote Health Services to help monitor patient's condition related to COVID 19 . Informed the patient and her sister [name] of plans to request [facility] reach out to schedule Ms. Galvan for the monoclonal antibody infusion and orders for Remote Health to visit the patient in her home to further assess her condition, patient and sister verbalize understanding  . Discussed plans with patient for ongoing care management follow up and provided patient with direct  contact information for care management team 03/07/20 call completed with patient and sister [name] . Determined patient's symptoms have not improved much since receiving the monoclonial antibody  . Discussed sister's concerns related to patient's oxygen saturation dropping into the 60's if her Oxygen is removed for only a few seconds . Determined with Oxygen the patient is sating at 94%, Determined patient's sister notified Cardiology and MD agreed to see the patient in the office this week . Determined patient is prescribed to be on Oxygen at 3 lts per nasal cannula and has an Oxygen concentrator in the home from [place] . Determined patient does not have a portable Oxygen tank at this time but is need of one for patient to attend f/u appointments scheduled this week with Cardiology and Pulmonology . Discussed sister has also noticed Ms. Gary to experience some intermittent confusion that is not normal for her, determined her Oxygen level was around 94% when the confusion was noticed . Determined patient continues to have Remote Health in place for COVID support but this service is currently telephonic . Collaborated with Dr. Baird Cancer to report sister's concerns and reported symptoms, advised a new referral for Remote Health home services is needed . Discussed Dr. Baird Cancer supports patient's sister calling EMS to have patient transported to [facility] for further evaluation, sister notified and agreed  03/08/20 Placed outbound call to [place] RN requesting a home visit be scheduled  . Determined a new Remote Health referral is needed by PCP in order to transition patient from [place] support to home base support   Patient Self Care Activities:  . Attends all scheduled provider appointments . Calls provider office for new concerns or questions . Supportive sister to assist with care needs  Please see past updates related to this goal by clicking on the "Past Updates"  button in the selected goal        Plan:   Telephone follow up appointment with care management team member scheduled for: 04/04/20  Barb Merino, RN, BSN, CCM Care Management Coordinator [place] [facility]  Direct Phone: [phone]

## 2020-03-11 DIAGNOSIS — I5032 Chronic diastolic (congestive) heart failure: Secondary | ICD-10-CM | POA: Diagnosis not present

## 2020-03-12 DIAGNOSIS — I517 Cardiomegaly: Secondary | ICD-10-CM | POA: Diagnosis not present

## 2020-03-14 ENCOUNTER — Other Ambulatory Visit: Payer: Self-pay

## 2020-03-14 ENCOUNTER — Ambulatory Visit: Payer: Self-pay

## 2020-03-14 DIAGNOSIS — I5032 Chronic diastolic (congestive) heart failure: Secondary | ICD-10-CM

## 2020-03-14 NOTE — Chronic Care Management (AMB) (Signed)
Chronic Care Management Pharmacy  Name: [name]  MRN: [id number] DOB: [birth year]  Chief Complaint/ HPI  [name],  74 y.o. , female presents for their Follow-Up CCM visit with the clinical pharmacist via telephone due to COVID-19 Pandemic. Spoke with patient for a short time over the phone today, but she requested we complete follow up call at a later time. Pt was lying down resting and did not have her medications with her. Of note, she is recovering from recent COVID infection and was coughing frequently during our visit today. She states that she is slowly getting better.   PCP : [name], MD  Their chronic conditions include: Hypertension, Chronic diastolic CHF, Diabetes, Seizure disorder, Rheumatoid arthritis, Dyslipidemia  Office Visits: 01/25/20 Telephone call: Notified pt that [place] samples are ready for pickup  01/06/20 AWV  11/17/19 OV: Presented for hospitalization follow up. No seizure activity since discharge. Pt reported she could not tolerate Keppra 562m twice daily and that Neurology said she could take 1/2 tablet in the morning and 1 tablet in the evening. Pt noted memory loss as a problem. Advised that it could be related to seizure activity or elevated blood sugars. Was discharged on Lantus, but advised to resume Toujeo as originally prescribed. Advised to check blood sugar every morning in order for insulin dose adjustments to be made. Labs ordered (BMP8+EGFR, CBC no Diff).   09/28/19 OV: Presented for evaluation of back and foot pain. Chronic left-sided sciatica flare. Administered Kenalog 670mIM. Advised to get magnesium oil to rub on low back. Consider Neurosurgery evaluation. Referred to Podiatry for orthotics. Importance of medication adherence stressed. BP uncontrolled, but pt had not taken fluid pill.    08/24/19 OV: Presented for DM and HTN follow up. Reported Ed prescribed Keppra for seizures on 2/6, but pt has not started medication. Pt  advised to take medication as prescribed by ED physician. Will try to move up Neuro appt. Labs ordered (HgbA1c, BMP8+EGFR, potassium, lamotrigine).   Consult Visits: 03/09/20 Pulmonology OV w/ Dr. MaVaughan BrownerEvaluation post COVID infection. Chest Xray today. PFTs in 3 months to reevaluate lung function. Continue supplemental oxygen with portable concentrator.   03/09/20 Cardiology OV w/ B. Simmons: Chronic diastolic heart failure follow up. NYHA class I-II, increased fatigue post COVID infection. Plan Echo to reassess EF post viral infection. Euvolemic on exam. Continue Lasix 8033maily. Continue CardioMEMs transmission daily. Continue CPAP nightly. Continue current HTN regimen (Coreg 92m26mD, hydralazine 75mg8m, Imdur 30mg 45my). Continue Keppra and Lamictal twice daily. Continue statin and aspirin 81mg d57m. Follow up in 3 months.   02/23/20 Monoclonal antibody infusion for COVID infection  01/28/20 ED: Presented for evaluation of generalized weakness and possible post-ictal state. Slow to respond but not confused. Brain CT stable from prior exam. No medication changes.   01/14/20 Neurology MyChart message: Notify pt that brain wave test was normal. Pt to continue lamotrigine 150mg tw49mdaily and Keppra 500mg 2 t38mts at bedtime.   01/07/20 Cardiology OV w/ Dr. Harding: Ellyn Hacke follow up with Advanced heart failure clinic (Dr. Mclean). Aundra Dubinue Lasix sliding. Continue medications as prescribed by Dr. Mclean.  [name] Podiatry OV w/ Dr. Stover: ECannon Kettleion of bilateral foot wound that has been present for several years. Significant pain in both feet. Xrays from 2014 reviewed, consistent with end-stage pes planus and arthritis. Discussed treatment option. Ordered arthritic panel. Pt advised to continue supportive devices for ambulation, supportive shoes, and physical therapy exercises given. Treatment options are  limited (ankle fusion, however lack of success discussed).  01/01/20 Referred to GI for  hematochezia  12/30/19 48-hr Ambulatory video EEG: Results were normal. This does not exclude clinical diagnosis of epilepsy. Typical events were not captured.  12/21/19 Neurology telephone call: Dr. Delice Lesch would like to do a 48-hour EEG to determine where seizures are coming from. Increase Keppra to 554m 2 tablets at bedtime. Continue Lamictal 1516mBID.   12/17/19 Cardiology OV w/ Dr. McAundra DubinPresented for CHF follow up. Occasional atypical chest pain. Dyspnea comes and goes. Occasional dizzy episodes. Generally feels weak and fatigued. Not volume overloaded, NYHA class II symptoms. Continue Lasix 8076maily. Will have pt see GI for hematochezia. No role for selective pulmonary vasodilators. Continue Lasix 53m52mily  12/03/19 Cardiology OV w/B. Simmons PAC: Follow up for chronic diastolic post hospitalization. Pt recently evaluated for syncope. Heart monitor showed no significant arrhythmia. Continue carvedilol 25mg73m. Continue Imdur 30mg 53my. Increase hydralazine to 75mg t46m daily or three times daily  (note states both directions).   11/09/19 CardioMEMS placed  11/08/19-11/11/19 ED admission: Presented with syncope. Hospital course complicated by seizure. Cardiology placed CardioMEMS and thinks her symptoms are secondary to seizures, but seizure evaluation in hospital was negative (EEG, CT head, MRI brain). Neurology recommended increasing Keppra to 500mg tw61mdaily and continue Lamictal. Lantus decreased to 5 units at bedtime due to hypoglycemic episode in hospital. Follow up with PCP in 1-2 weeks. Obtain BMP/CBC in 1 week. Follow up with Cardiology and Neurology.  09/15/19 Neurology telephone call: Pt called and reported possible side effect from Keppra (light-headed and no energy). Instructed pt to decrease Keppra to 500mg at 62mime.   09/11/19 Neurology OV w/ Dr. Aquino: PDelice Lesch for follow up regarding seizures. Start Keppra 500mg BID.32mtinue Lamictal 150mg BID. 12mow up in 6 months.    09/08/19 Cardiology OV w/ Dr. McLean: PreAundra Dubin for follow up of CHF. SOB walking around house. 3 pillow orthopnea chronically. NYHA III, not volume overloaded. Continue Lasix 53mg daily.75mnot think metolazone needed. Arrange PFTs. Follow up in 3 months.   08/01/19 ED visit: Presented for chest pain and seizures. Started Keppra 500mg twice d67m. Follow up with primary neurologist.   07/23/19 Cardiology OV w/ Dr. McLean: ContiAundra Dubinrrent medications. Lab work done. Will schedule pt for chest Xray and VQ scan. PT will need pulmonary function testing once they begin doing outpatient. Referred for CardioMEMS device.   07/10/19 Neurology Telephone call: Left message instructing pt to increase Lamictal to 150mg BID (Pre54ms dose 125mg BID, but 69meported seizures).   06/24/19 Neurology OV w/ A. Lomax: Presented for 7 month follow up of OSA on CPAP. Pt is 80-90% compliant with CPAP use. Finds it beneficial and denied any issues. Follow up with Dr. Alva (PulmonoloElsworth Sohoas directed for CPAP management and hypoventilation syndrome.   05/28/19 Pulmonology OV w/ Dr. Alva: Continue Elsworth Sohox 80 mg daily and 40mg on the wee91ms. Breathing improved with weight loss. CPAP setting working well. Pt is fairly compliant with CPAP, encouraged 4-6 hrs nightly use.   CCM Encounters: 11/19/19 SW: Referred to PharmD to address adherence issues.   09/07/19 RN: Evaluated current DM treatment plan. Collaborated with PCP regarding Humalog refill needed. Determined pt received Keppra via mail but has not started taking, wants to see Neurology first.   09/02/19 SW: Determined [facility] has not [place] [facility]ppra yet, [facility]. Discussed BP. PharmD[facility]   08/07/19 SW: Follow up with pt post-ED visit. Discussed new medication and plan to follow up with neurology.   07/06/19 PharmD: Comprehensive medication review performed. PT reported seizure activity  for past 2-3 weeks. Advised caregiver (sister) to call Dr. Delice Lesch regarding this matter ASAP. Notified Dr. Baird Cancer.   Medications: Outpatient Encounter Medications as of 03/14/2020  Medication Sig  . acetaminophen (TYLENOL) 500 MG tablet Take 500-1,000 mg by mouth every 4 (four) hours as needed for moderate pain.   Marland Kitchen albuterol (VENTOLIN HFA) 108 (90 Base) MCG/ACT inhaler Inhale 1-2 puffs into the lungs every 6 (six) hours as needed for wheezing or shortness of breath.  . Alcohol Swabs (ALCOHOL PADS) 70 % PADS Use as directed dx: e11.22  . aspirin 81 MG chewable tablet Chew 1 tablet (81 mg total) by mouth daily.  Marland Kitchen atorvastatin (LIPITOR) 10 MG tablet Take 1 tablet (10 mg total) by mouth every evening.  . B Complex-C (B-COMPLEX WITH VITAMIN C) tablet Take 1 tablet by mouth daily.  . B-D ULTRAFINE III SHORT PEN 31G X 8 MM MISC USE DAILY WITH VICTOZA AND/OR NOVOLOG.  . Blood Glucose Calibration (TRUE METRIX LEVEL 1) Low SOLN Use as directed  Dx:e11.22  . Blood Glucose Monitoring Suppl (TRUE METRIX METER) w/Device KIT Use as directed to check blood sugars 2 times per day dx: e11.22  . carvedilol (COREG) 25 MG tablet TAKE 1 TABLET BY MOUTH TWICE DAILY WITH MEALS  . Cholecalciferol (VITAMIN D-3) 1000 units CAPS Take 1,000 Units by mouth daily.  Marland Kitchen dexlansoprazole (DEXILANT) 60 MG capsule Take 1 capsule (60 mg total) by mouth daily.  . diclofenac sodium (VOLTAREN) 1 % GEL Apply 2 g topically 4 (four) times daily as needed (pain).  . furosemide (LASIX) 80 MG tablet Take 80 mg by mouth daily. 76m daily Monday-Friday, 434mon Saturday and Sunday  . glucose blood (TRUE METRIX BLOOD GLUCOSE TEST) test strip Use as directed to check blood sugars 2 times per day dx: e11.22  . hydrALAZINE (APRESOLINE) 25 MG tablet Take 3 tablets (75 mg total) by mouth 3 (three) times daily. (Patient taking differently: Take 75 mg by mouth in the morning and at bedtime. )  . hydrocortisone cream 1 % Apply 1 application topically  daily as needed for itching.   . insulin lispro (HUMALOG KWIKPEN) 100 UNIT/ML KwikPen Inject 0.02-0.04 mLs (2-4 Units total) into the skin 3 (three) times daily as needed (high blood sugar over 150).  . Isopropyl Alcohol (ALCOHOL WIPES) 70 % MISC Apply 1 each topically 2 (two) times daily.  . isosorbide mononitrate (IMDUR) 30 MG 24 hr tablet Take 1 tablet (30 mg total) by mouth daily.  . Marland KitchenamoTRIgine (LAMICTAL) 150 MG tablet Take 1 tablet (150 mg total) by mouth 2 (two) times daily.  . Marland KitchenevETIRAcetam (KEPPRA) 500 MG tablet 2 tablets at bedtime  . Menthol, Topical Analgesic, (STOPAIN ROLL-ON EX) Apply 1 application topically daily as needed (pain).  . Multiple Vitamins-Minerals (MULTIVITAMIN WITH MINERALS) tablet Take 1 tablet by mouth daily.  . Marland Kitcheneomycin-bacitracin-polymyxin (NEOSPORIN) ointment Apply 1 application topically as needed for wound care.   . Marland KitchenZEMPIC, 1 MG/DOSE, 4 MG/3ML SOPN INJECT 1MG INTO THE SKIN ONCE A WEEK  . Pancrelipase, Lip-Prot-Amyl, (ZENPEP) 25000-79000 units CPEP Take 1-2 capsules by mouth See admin instructions. Take 2 capsules in the morning, 1 capsule with lunch, and 2 capsules in the evening.  . polyethylene glycol (MIRALAX / GLYCOLAX) 17 g packet Take 17 g by mouth daily  as needed for moderate constipation or severe constipation.  . potassium chloride SA (KLOR-CON) 20 MEQ tablet TAKE 2 TABLETS EVERY DAY BUT WHEN TAKING METOLAZONE TAKE 3 TABLETS DAILY AS DIRECTED  . sertraline (ZOLOFT) 50 MG tablet Take 1 tablet (50 mg total) by mouth daily.  Marland Kitchen tolnaftate (TINACTIN) 1 % cream Apply 1 application topically daily as needed (foot fungus).   [name] SOLOSTAR 300 UNIT/ML Solostar Pen INJECT 36 UNITS INTO THE SKIN AT BEDTIME. (Patient taking differently: 34 Units. )   No facility-administered encounter medications on file as of 03/14/2020.    Current Diagnosis/Assessment:   Goals Addressed            This Visit's Progress   . Pharmacy Care Plan       CARE PLAN  ENTRY (see longitudinal plan of care for additional care plan information)  Current Barriers:  . Chronic Disease Management support, education, and care coordination needs related to Hypertension, Hyperlipidemia, Diabetes, and Heart Failure   Hypertension BP Readings from Last 3 Encounters:  03/09/20 140/78  03/09/20 122/78  02/23/20 116/65   . Pharmacist Clinical Goal(s): o Over the next 90 days, patient will work with PharmD and providers to maintain BP goal <130/80 . Current regimen:  o Carvedilol 25mg  twice daily o Furosemide 80 mg daily  o Hydralazine 25mg  3 tablets twice daily . Interventions: o Patient mentioned medication changes - Addition of metolazone (patient unsure of strength) - Now taking hydralazine 75mg  three times daily . Advised patient to make sure she has Hydralazine 50mg  tablets instead of Hydralazine 25mg  tablets (patient mentioned taking 1 and 1/2 tablets three times daily, but last prescription [facility] by [facility] was for 25mg  tablets and she would need to take 3 tablets at each dose to make 75mg ) - Verify dosing of medications with patient and Cardiologist at follow up o Advised patient to take medications consistently at the same time each day o Discussed appropriate goals for blood pressure less than 130/80 . Patient self care activities - Over the next 90 days, patient will: o Check BP daily, document, and provide at future appointments o Ensure daily salt intake < 2300 mg/day o Verify strength of hydralazine at home - If 50mg - need to take 1 and 1/2 tablets for each dose to make 75mg  - If 25mg - need to take 3 tablets for each dose to make 75mg   Hyperlipidemia Lab Results  Component Value Date/Time   LDLCALC 65 12/17/2019 11:46 AM   LDLCALC 65 10/22/2018 02:46 PM   LDLDIRECT 66.0 01/27/2015 11:04 AM   . Pharmacist Clinical Goal(s): o Over the next 90 days, patient will work with PharmD and providers to maintain LDL goal < 70 . Current regimen:   o Atorvastatin 10mg  daily . Interventions: o Dietary recommendations provided . Patient self care activities - Over the next 90 days, patient will: o Take cholesterol medication daily as directed  Diabetes Lab Results  Component Value Date/Time   HGBA1C 7.6 (H) 11/09/2019 12:21 AM   HGBA1C 8.0 (H) 08/24/2019 03:30 PM   . Pharmacist Clinical Goal(s): o Over the next 90 days, patient will work with PharmD and providers to achieve A1c goal <7% . Current regimen:  o Humalog Kwikpen 2-4 units three times daily as needed o Ozempic 1mg  weekly o Toujeo 300 units/mL 34 units at bedtime . Interventions: o Dietary recommendations provided o Discussed hypoglycemia and appropriate treatment . Patient self care activities - Over the next 90 days, patient will: o Check blood  sugar 3-4 times daily, document, and provide at future appointments o Contact provider with any episodes of hypoglycemia  Medication management . Pharmacist Clinical Goal(s): o Over the next 90 days, patient will work with PharmD and providers to maintain optimal medication adherence . Current pharmacy: [facility] . Interventions o Comprehensive medication review performed. o Medication list updated o Continue current medication management strategy . Patient self care activities - Over the next 90 days, patient will: o Focus on medication adherence by using pill box o Take medications as prescribed o Report any questions or concerns to PharmD and/or provider(s)  Please see past updates related to this goal by clicking on the "Past Updates" button in the selected goal        Diabetes   Recent Relevant Labs: Lab Results  Component Value Date/Time   HGBA1C 7.6 (H) 11/09/2019 12:21 AM   HGBA1C 8.0 (H) 08/24/2019 03:30 PM   MICROALBUR 10 01/06/2020 12:16 PM   MICROALBUR 10 10/22/2018 12:45 PM    Checking BG: 3x per Day at least and more if too low or too high  Recent FBG Readings:  Recent pre-meal BG  readings:  Recent 2hr PP BG readings:   Recent HS BG readings:  Patient has failed these meds in past: Jardiance, Invokana, Lantus, Levemir, Victoza, Metformin Patient is currently uncontrolled on the following medications:   Humalog Kwikpen 2-4 units three times daily as needed  Ozempic 26m weekly  Toujeo 300 units/mL 34 units at bedtime  Last diabetic Foot exam: Not on file Last diabetic Eye exam:  Lab Results  Component Value Date/Time   HMDIABEYEEXA No Retinopathy 02/02/2020 12:00 AM   Plan Continue current medications  Review recent BG reading at follow up   Heart Failure   Type: Diastolic  Last ejection fraction: 60-65% on 11/09/19 NYHA Class: II (slight limitation of activity)  Patient has failed these meds in past: Isosorbide dinitrate, metolazone, Bystolic, torsemide Patient is currently controlled on the following medications:   Carvedilol 245mtwice daily  Furosemide 80 mg daily   Hydralazine 2529m tablets three times daily  Isosorbide mononitrate 75m75mily  Potassium 20me57mtablets daily  We discussed  weighing daily; if you gain more than 3 pounds in one day or 5 pounds in one week call your doctor   Pt states she has restarted metolazone as needed for fluid based on home health nurse recommendation. States she is picking up newly filled  prescription tomorrow, but had some left over at her house from prior prescription  Pt unsure of strength  Pt states she is taking Hydralazine 1.5 tablets three times daily  Advised pt to make sure she has to 50mg 67mets and not the 25mg (90m [facility] [place]25mg ta71ms was filled)  Pt reports she has been taking hydralazine three times daily since she has been sick  Plan Continue current medications  Confirm dosing of medications with patient and Cardiology   Hypertension   Office blood pressures are  BP Readings from Last 3 Encounters:  03/09/20 140/78  03/09/20 122/78  02/23/20 116/65    Patient has failed these meds in the past: Valsartan, losartan Patient is currently controlled on the following medications: See HF  Patient checks BP at home daily in the morning  Patient home BP readings are ranging: None provided today  Plan Continue current medications  Confirm dosing of medications with patient and Cardiology   Hyperlipidemia   Lipid Panel  Component Value Date/Time   CHOL 150 12/17/2019 1146   CHOL 145 10/22/2018 1446   TRIG 47 12/17/2019 1146   HDL 76 12/17/2019 1146   HDL 70 10/22/2018 1446   LDLCALC 65 12/17/2019 1146   LDLCALC 65 10/22/2018 1446   LDLDIRECT 66.0 01/27/2015 1104    The 10-year ASCVD risk score Mikey Bussing DC Jr., et al., 2013) is: 21.1%   Values used to calculate the score:     Age: 42 years     Sex: Female     Is Non-Hispanic African American: Yes     Diabetic: Yes     Tobacco smoker: No     Systolic Blood Pressure: 859 mmHg     Is BP treated: Yes     HDL Cholesterol: 76 mg/dL     Total Cholesterol: 150 mg/dL   Patient has failed these meds in past: N/A Patient is currently controlled on the following medications:   Atorvastatin 67m daily  Plan Continue current medications   Seizure Disorder   Patient has failed these meds in past: N/A Patient is currently uncontrolled on the following medications:   Lamotrigine 1561mtwice daily  Levetiracetam 50023m tablets at bedtime (increased 6/28)  Plan Continue current medications   Vaccines   Reviewed and discussed patient's vaccination history.    Immunization History  Administered Date(s) Administered  . Influenza Split 04/11/2011, 06/30/2013  . Influenza, High Dose Seasonal PF 02/14/2017, 04/24/2018  . Influenza-Unspecified 04/07/2014  . PFIZER SARS-COV-2 Vaccination 08/06/2019, 08/31/2019  . Tdap 05/18/2012  . Zoster 04/14/2014  Prevnar13 received 03/16/15 Pneumovax23 received 01/18/16  Plan  No further pneumonia shots needed at this time  Recommend pt  receive Shingrix vaccine at local pharmacy  Medication Management   Pt uses [facility] all medications Uses pill box? Yes Pt endorses 100% compliance  We discussed:   Importance of taking medications daily as directed  Plan Continue current medication management strategy   Follow up: 2 week phone visit  CouJannette FogoharmD Clinical Pharmacist [facility] [phone]

## 2020-03-14 NOTE — Patient Instructions (Addendum)
Visit Information  Goals Addressed            This Visit's Progress   . Pharmacy Care Plan       CARE PLAN ENTRY (see longitudinal plan of care for additional care plan information)  Current Barriers:  . Chronic Disease Management support, education, and care coordination needs related to Hypertension, Hyperlipidemia, Diabetes, and Heart Failure   Hypertension BP Readings from Last 3 Encounters:  03/09/20 140/78  03/09/20 122/78  02/23/20 116/65   . Pharmacist Clinical Goal(s): o Over the next 90 days, patient will work with PharmD and providers to maintain BP goal <130/80 . Current regimen:  o Carvedilol 25mg  twice daily o Furosemide 80 mg daily  o Hydralazine 25mg  3 tablets twice daily . Interventions: o Patient mentioned medication changes - Addition of metolazone (patient unsure of strength) - Now taking hydralazine 75mg  three times daily . Advised patient to make sure she has Hydralazine 50mg  tablets instead of Hydralazine 25mg  tablets (patient mentioned taking 1 and 1/2 tablets three times daily, but last prescription filled by [facility] was for 25mg  tablets and she would need to take 3 tablets at each dose to make 75mg ) - Verify dosing of medications with patient and Cardiologist at follow up o Advised patient to take medications consistently at the same time each day o Discussed appropriate goals for blood pressure less than 130/80 . Patient self care activities - Over the next 90 days, patient will: o Check BP daily, document, and provide at future appointments o Ensure daily salt intake < 2300 mg/day o Verify strength of hydralazine at home - If 50mg - need to take 1 and 1/2 tablets for each dose to make 75mg  - If 25mg - need to take 3 tablets for each dose to make 75mg   Hyperlipidemia Lab Results  Component Value Date/Time   LDLCALC 65 12/17/2019 11:46 AM   LDLCALC 65 10/22/2018 02:46 PM   LDLDIRECT 66.0 01/27/2015 11:04 AM   . Pharmacist Clinical  Goal(s): o Over the next 90 days, patient will work with PharmD and providers to maintain LDL goal < 70 . Current regimen:  o Atorvastatin 10mg  daily . Interventions: o Dietary recommendations provided . Patient self care activities - Over the next 90 days, patient will: o Take cholesterol medication daily as directed  Diabetes Lab Results  Component Value Date/Time   HGBA1C 7.6 (H) 11/09/2019 12:21 AM   HGBA1C 8.0 (H) 08/24/2019 03:30 PM   . Pharmacist Clinical Goal(s): o Over the next 90 days, patient will work with PharmD and providers to achieve A1c goal <7% . Current regimen:  o Humalog Kwikpen 2-4 units three times daily as needed o Ozempic 1mg  weekly o Toujeo 300 units/mL 34 units at bedtime . Interventions: o Dietary recommendations provided o Discussed hypoglycemia and appropriate treatment . Patient self care activities - Over the next 90 days, patient will: o Check blood sugar 3-4 times daily, document, and provide at future appointments o Contact provider with any episodes of hypoglycemia  Medication management . Pharmacist Clinical Goal(s): o Over the next 90 days, patient will work with PharmD and providers to maintain optimal medication adherence . Current pharmacy: [facility] . Interventions o Comprehensive medication review performed. o Medication list updated o Continue current medication management strategy . Patient self care activities - Over the next 90 days, patient will: o Focus on medication adherence by using pill box o Take medications as prescribed o Report any questions or concerns to PharmD and/or provider(s)  Please see past updates related to this goal by clicking on the "Past Updates" button in the selected goal         The patient verbalized understanding of instructions provided today and agreed to receive a mailed copy of patient instruction and/or educational materials.  Telephone follow up appointment with pharmacy team  member scheduled for: 03/28/20 @ 12:00 PM  Jannette Fogo, PharmD Clinical Pharmacist [facility] [phone]    DASH Eating Plan DASH stands for "Dietary Approaches to Stop Hypertension." The DASH eating plan is a healthy eating plan that has been shown to reduce high blood pressure (hypertension). It may also reduce your risk for type 2 diabetes, heart disease, and stroke. The DASH eating plan may also help with weight loss. What are tips for following this plan?  General guidelines  Avoid eating more than 2,300 mg (milligrams) of salt (sodium) a day. If you have hypertension, you may need to reduce your sodium intake to 1,500 mg a day.  Limit alcohol intake to no more than 1 drink a day for nonpregnant women and 2 drinks a day for men. One drink equals 12 oz of beer, 5 oz of wine, or 1 oz of hard liquor.  Work with your health care provider to maintain a healthy body weight or to lose weight. Ask what an ideal weight is for you.  Get at least 30 minutes of exercise that causes your heart to beat faster (aerobic exercise) most days of the week. Activities may include walking, swimming, or biking.  Work with your health care provider or diet and nutrition specialist (dietitian) to adjust your eating plan to your individual calorie needs. Reading food labels   Check food labels for the amount of sodium per serving. Choose foods with less than 5 percent of the Daily Value of sodium. Generally, foods with less than 300 mg of sodium per serving fit into this eating plan.  To find whole grains, look for the word "whole" as the first word in the ingredient list. Shopping  Buy products labeled as "low-sodium" or "no salt added."  Buy fresh foods. Avoid canned foods and premade or frozen meals. Cooking  Avoid adding salt when cooking. Use salt-free seasonings or herbs instead of table salt or sea salt. Check with your health care provider or pharmacist before  using salt substitutes.  Do not fry foods. Cook foods using healthy methods such as baking, boiling, grilling, and broiling instead.  Cook with heart-healthy oils, such as olive, canola, soybean, or sunflower oil. Meal planning  Eat a balanced diet that includes: ? 5 or more servings of fruits and vegetables each day. At each meal, try to fill half of your plate with fruits and vegetables. ? Up to 6-8 servings of whole grains each day. ? Less than 6 oz of lean meat, poultry, or fish each day. A 3-oz serving of meat is about the same size as a deck of cards. One egg equals 1 oz. ? 2 servings of low-fat dairy each day. ? A serving of nuts, seeds, or beans 5 times each week. ? Heart-healthy fats. Healthy fats called Omega-3 fatty acids are found in foods such as flaxseeds and coldwater fish, like sardines, salmon, and mackerel.  Limit how much you eat of the following: ? Canned or prepackaged foods. ? Food that is high in trans fat, such as fried foods. ? Food that is high in saturated fat, such as fatty meat. ? Sweets, desserts, sugary drinks,  and other foods with added sugar. ? Full-fat dairy products.  Do not salt foods before eating.  Try to eat at least 2 vegetarian meals each week.  Eat more home-cooked food and less restaurant, buffet, and fast food.  When eating at a restaurant, ask that your food be prepared with less salt or no salt, if possible. What foods are recommended? The items listed may not be a complete list. Talk with your dietitian about what dietary choices are best for you. Grains Whole-grain or whole-wheat bread. Whole-grain or whole-wheat pasta. Brown rice. [place]. Bulgur. Whole-grain and low-sodium cereals. Pita bread. Low-fat, low-sodium crackers. Whole-wheat flour tortillas. Vegetables Fresh or frozen vegetables (raw, steamed, roasted, or grilled). Low-sodium or reduced-sodium tomato and vegetable juice. Low-sodium or reduced-sodium tomato sauce and  tomato paste. Low-sodium or reduced-sodium canned vegetables. Fruits All fresh, dried, or frozen fruit. Canned fruit in natural juice (without added sugar). Meat and other protein foods Skinless chicken or [place]. Ground chicken or [place]. Pork with fat trimmed off. Fish and seafood. Egg whites. Dried beans, peas, or lentils. Unsalted nuts, nut butters, and seeds. Unsalted canned beans. Lean cuts of beef with fat trimmed off. Low-sodium, lean deli meat. Dairy Low-fat (1%) or fat-free (skim) milk. Fat-free, low-fat, or reduced-fat cheeses. Nonfat, low-sodium ricotta or cottage cheese. Low-fat or nonfat yogurt. Low-fat, low-sodium cheese. Fats and oils Soft margarine without trans fats. Vegetable oil. Low-fat, reduced-fat, or light mayonnaise and salad dressings (reduced-sodium). Canola, safflower, olive, soybean, and sunflower oils. Avocado. Seasoning and other foods Herbs. Spices. Seasoning mixes without salt. Unsalted popcorn and pretzels. Fat-free sweets. What foods are not recommended? The items listed may not be a complete list. Talk with your dietitian about what dietary choices are best for you. Grains Baked goods made with fat, such as croissants, muffins, or some breads. Dry pasta or rice meal packs. Vegetables Creamed or fried vegetables. Vegetables in a cheese sauce. Regular canned vegetables (not low-sodium or reduced-sodium). Regular canned tomato sauce and paste (not low-sodium or reduced-sodium). Regular tomato and vegetable juice (not low-sodium or reduced-sodium). Angie Fava. Olives. Fruits Canned fruit in a light or heavy syrup. Fried fruit. Fruit in cream or butter sauce. Meat and other protein foods Fatty cuts of meat. Ribs. Fried meat. Berniece Salines. Sausage. Bologna and other processed lunch meats. Salami. Fatback. Hotdogs. Bratwurst. Salted nuts and seeds. Canned beans with added salt. Canned or smoked fish. Whole eggs or egg yolks. Chicken or [place] with skin. Dairy Whole or 2%  milk, cream, and half-and-half. Whole or full-fat cream cheese. Whole-fat or sweetened yogurt. Full-fat cheese. Nondairy creamers. Whipped toppings. Processed cheese and cheese spreads. Fats and oils Butter. Stick margarine. Lard. Shortening. Ghee. Bacon fat. Tropical oils, such as coconut, palm kernel, or palm oil. Seasoning and other foods Salted popcorn and pretzels. Onion salt, garlic salt, seasoned salt, table salt, and sea salt. Worcestershire sauce. Tartar sauce. Barbecue sauce. Teriyaki sauce. Soy sauce, including reduced-sodium. Steak sauce. Canned and packaged gravies. Fish sauce. Oyster sauce. Cocktail sauce. Horseradish that you find on the shelf. Ketchup. Mustard. Meat flavorings and tenderizers. Bouillon cubes. Hot sauce and Tabasco sauce. Premade or packaged marinades. Premade or packaged taco seasonings. Relishes. Regular salad dressings. Where to find more information:  National Heart, Lung, and [place]: https://wilson-eaton.com/  American Heart Association: www.heart.org Summary  The DASH eating plan is a healthy eating plan that has been shown to reduce high blood pressure (hypertension). It may also reduce your risk for type 2 diabetes, heart disease, and stroke.  With the DASH eating plan, you should limit salt (sodium) intake to 2,300 mg a day. If you have hypertension, you may need to reduce your sodium intake to 1,500 mg a day.  When on the DASH eating plan, aim to eat more fresh fruits and vegetables, whole grains, lean proteins, low-fat dairy, and heart-healthy fats.  Work with your health care provider or diet and nutrition specialist (dietitian) to adjust your eating plan to your individual calorie needs. This information is not intended to replace advice given to you by your health care provider. Make sure you discuss any questions you have with your health care provider. Document Revised: 05/24/2017 Document Reviewed: 06/04/2016 Elsevier Patient Education  2020  [employer].

## 2020-03-15 ENCOUNTER — Encounter (HOSPITAL_COMMUNITY): Payer: Medicare HMO

## 2020-03-15 ENCOUNTER — Other Ambulatory Visit: Payer: Self-pay | Admitting: Pulmonary Disease

## 2020-03-15 DIAGNOSIS — R0609 Other forms of dyspnea: Secondary | ICD-10-CM

## 2020-03-16 ENCOUNTER — Telehealth: Payer: Self-pay

## 2020-03-16 NOTE — Telephone Encounter (Signed)
The pt's sister Mrs. Pressley said that the pt needed an rx for a cpap adapter and a two wheel oxygen tank cylinder cart carrier sent to [facility].  I told Mrs. Pressley that I would let Dr. Baird Cancer know.

## 2020-03-17 ENCOUNTER — Other Ambulatory Visit (HOSPITAL_COMMUNITY): Payer: Self-pay

## 2020-03-17 DIAGNOSIS — I5032 Chronic diastolic (congestive) heart failure: Secondary | ICD-10-CM

## 2020-03-20 DIAGNOSIS — R062 Wheezing: Secondary | ICD-10-CM | POA: Diagnosis not present

## 2020-03-20 DIAGNOSIS — U071 COVID-19: Secondary | ICD-10-CM | POA: Diagnosis not present

## 2020-03-20 DIAGNOSIS — G4733 Obstructive sleep apnea (adult) (pediatric): Secondary | ICD-10-CM | POA: Diagnosis not present

## 2020-03-20 DIAGNOSIS — I5032 Chronic diastolic (congestive) heart failure: Secondary | ICD-10-CM | POA: Diagnosis not present

## 2020-03-20 DIAGNOSIS — J9621 Acute and chronic respiratory failure with hypoxia: Secondary | ICD-10-CM | POA: Diagnosis not present

## 2020-03-21 ENCOUNTER — Other Ambulatory Visit: Payer: Self-pay

## 2020-03-21 ENCOUNTER — Ambulatory Visit (HOSPITAL_COMMUNITY)
Admission: RE | Admit: 2020-03-21 | Discharge: 2020-03-21 | Disposition: A | Discharge status: Home / Self Care | Payer: Medicare HMO | Source: Ambulatory Visit | Attending: Internal Medicine | Admitting: Internal Medicine

## 2020-03-21 DIAGNOSIS — I5032 Chronic diastolic (congestive) heart failure: Secondary | ICD-10-CM | POA: Insufficient documentation

## 2020-03-21 DIAGNOSIS — G4730 Sleep apnea, unspecified: Secondary | ICD-10-CM | POA: Insufficient documentation

## 2020-03-21 DIAGNOSIS — Q256 Stenosis of pulmonary artery: Secondary | ICD-10-CM | POA: Insufficient documentation

## 2020-03-21 DIAGNOSIS — I272 Pulmonary hypertension, unspecified: Secondary | ICD-10-CM | POA: Insufficient documentation

## 2020-03-21 DIAGNOSIS — I110 Hypertensive heart disease with heart failure: Secondary | ICD-10-CM | POA: Insufficient documentation

## 2020-03-21 DIAGNOSIS — E119 Type 2 diabetes mellitus without complications: Secondary | ICD-10-CM | POA: Diagnosis not present

## 2020-03-21 DIAGNOSIS — E785 Hyperlipidemia, unspecified: Secondary | ICD-10-CM | POA: Insufficient documentation

## 2020-03-21 LAB — ECHOCARDIOGRAM COMPLETE
Area-P 1/2: 2.62 cm2
S' Lateral: 3 cm

## 2020-03-21 NOTE — Progress Notes (Signed)
Echocardiogram 2D Echocardiogram has been performed.  [name] 03/21/2020, 2:41 PM

## 2020-03-22 ENCOUNTER — Other Ambulatory Visit: Payer: Self-pay

## 2020-03-22 ENCOUNTER — Encounter: Payer: Self-pay | Admitting: Internal Medicine

## 2020-03-22 ENCOUNTER — Ambulatory Visit (INDEPENDENT_AMBULATORY_CARE_PROVIDER_SITE_OTHER): Payer: Medicare HMO | Admitting: Internal Medicine

## 2020-03-22 VITALS — BP 118/60 | HR 66 | Temp 97.9°F | Ht 59.6 in | Wt 215.6 lb

## 2020-03-22 DIAGNOSIS — E1122 Type 2 diabetes mellitus with diabetic chronic kidney disease: Secondary | ICD-10-CM | POA: Diagnosis not present

## 2020-03-22 DIAGNOSIS — K625 Hemorrhage of anus and rectum: Secondary | ICD-10-CM

## 2020-03-22 DIAGNOSIS — Z8616 Personal history of COVID-19: Secondary | ICD-10-CM

## 2020-03-22 DIAGNOSIS — D508 Other iron deficiency anemias: Secondary | ICD-10-CM | POA: Diagnosis not present

## 2020-03-22 DIAGNOSIS — N182 Chronic kidney disease, stage 2 (mild): Secondary | ICD-10-CM | POA: Diagnosis not present

## 2020-03-22 DIAGNOSIS — Z794 Long term (current) use of insulin: Secondary | ICD-10-CM

## 2020-03-22 DIAGNOSIS — I110 Hypertensive heart disease with heart failure: Secondary | ICD-10-CM

## 2020-03-22 DIAGNOSIS — Z6841 Body Mass Index (BMI) 40.0 and over, adult: Secondary | ICD-10-CM

## 2020-03-22 DIAGNOSIS — J9611 Chronic respiratory failure with hypoxia: Secondary | ICD-10-CM

## 2020-03-22 DIAGNOSIS — I5032 Chronic diastolic (congestive) heart failure: Secondary | ICD-10-CM | POA: Diagnosis not present

## 2020-03-22 MED ORDER — TRUEPLUS LANCETS 33G MISC: 2 refills | Status: DC

## 2020-03-22 MED ORDER — TRUE METRIX BLOOD GLUCOSE TEST VI STRP: ORAL_STRIP | 2 refills | Status: DC

## 2020-03-22 NOTE — Progress Notes (Signed)
I,[name],acting as a Education administrator for [name], MD.,have documented all relevant documentation on the behalf of [name], MD,as directed by  [name], MD while in the presence of [name], MD.  This visit occurred during the SARS-CoV-2 public health emergency.  Safety protocols were in place, including screening questions prior to the visit, additional usage of staff PPE, and extensive cleaning of exam room while observing appropriate contact time as indicated for disinfecting solutions.  Subjective:     Patient ID: [name] , female    DOB: 25-May-1946 , 74 y.o.   MRN: [id number]   Chief Complaint  Patient presents with  . Diabetes  . Hypertension    HPI  She is here today for a diabetes and BP check. She is now on oxygen since having COVID-19.. She does not have any other concerns at this time. She is accompanied by her sister today.   Diabetes She presents for her follow-up diabetic visit. She has type 2 diabetes mellitus. Her disease course has been improving. There are no hypoglycemic associated symptoms. Pertinent negatives for diabetes include no blurred vision and no chest pain. There are no hypoglycemic complications. Diabetic complications include nephropathy. Risk factors for coronary artery disease include diabetes mellitus, dyslipidemia, hypertension, obesity, post-menopausal and sedentary lifestyle. She is compliant with treatment some of the time. Her weight is fluctuating minimally. She is following a generally healthy diet. She never participates in exercise. Her home blood glucose trend is decreasing steadily. Her breakfast blood glucose is taken between 8-9 am. Her breakfast blood glucose range is generally 130-140 mg/dl. An ACE inhibitor/angiotensin II receptor blocker is being taken. Eye exam is not current.  Hypertension This is a chronic problem. The current episode started more than 1 year ago. The problem has been gradually improving  since onset. The problem is controlled. Pertinent negatives include no blurred vision, chest pain, palpitations or shortness of breath. The current treatment provides moderate improvement. Hypertensive end-organ damage includes kidney disease. Identifiable causes of hypertension include sleep apnea.     Past Medical History:  Diagnosis Date  . Abnormal liver function     in the past.  . Anemia   . Arthritis    all over  . Bruises easily   . Cataract    right eye;immature  . Chronic back pain    stenosis  . Chronic cough   . Chronic kidney disease   . COPD (chronic obstructive pulmonary disease) ([place])   . Demand myocardial infarction [facility]) 2012   Demand Infarction in setting of Pancreatitis --> mild Troponin elevation, NON-OBSTRUCTIVE CAD  . Depression    takes Abilify daily as well as Zoloft  . Diastolic heart failure 1583   Grade 1 diastolic Dysfunction by Echo   . Diverticulosis   . DM (diabetes mellitus) ([place])    takes Victoza daily as well as Lantus and Humalog  . Empty sella ([place])    on MRI in 2009.  . Glaucoma   . Headache(784.0)    last migraine-4-34yr ago  . History of blood transfusion    no abnormal reaction noted  . History of colon polyps    benign  . HTN (hypertension)    takes B[employer] Energydaily  . Hyperlipidemia    takes Lipitor daily  . Joint swelling   . Nocturia   . [place]   . Obstructive sleep apnea 02/2018   Notably improved split-night study with weight loss from  270 (2017) down to 250 pounds (2019).  . Pancreatitis    takes Pancrelipase daily  . Parkinson's disease ([place])    takes Sinemet daily  . Peripheral neuropathy   . Pneumonia 2012  . Seizures ([place])    takes Lamictal daily and Primidone nightly;last seizure 2wks ago  . Urinary urgency    With increased frequency  . Varicose veins of both lower extremities with pain    With edema.  Takes daily Lasix     Family History  Problem Relation  Age of Onset  . Allergies Father   . Heart disease Father        before 60  . Heart failure Father   . Hypertension Father   . Hyperlipidemia Father   . Heart disease Mother   . Diabetes Mother   . Hypertension Mother   . Hyperlipidemia Mother   . Hypertension Sister   . Heart disease Sister        before 51  . Hyperlipidemia Sister   . Diabetes Brother   . Hypertension Brother   . Diabetes Sister   . Hypertension Sister   . Diabetes Son   . Hypertension Son   . Cancer Other      Current Outpatient Medications:  .  acetaminophen (TYLENOL) 500 MG tablet, Take 500-1,000 mg by mouth every 4 (four) hours as needed for moderate pain. , Disp: , Rfl:  .  albuterol (VENTOLIN HFA) 108 (90 Base) MCG/ACT inhaler, Inhale 1-2 puffs into the lungs every 6 (six) hours as needed for wheezing or shortness of breath., Disp: , Rfl:  .  Alcohol Swabs (ALCOHOL PADS) 70 % PADS, Use as directed dx: e11.22, Disp: 300 each, Rfl: 2 .  Ascorbic Acid (VITAMIN C WITH ROSE HIPS) 1000 MG tablet, Take 1,000 mg by mouth daily., Disp: , Rfl:  .  aspirin 81 MG chewable tablet, Chew 1 tablet (81 mg total) by mouth daily., Disp:  , Rfl:  .  atorvastatin (LIPITOR) 10 MG tablet, Take 1 tablet (10 mg total) by mouth every evening., Disp: 90 tablet, Rfl: 2 .  B-D ULTRAFINE III SHORT PEN 31G X 8 MM MISC, USE DAILY WITH VICTOZA AND/OR NOVOLOG., Disp: 400 each, Rfl: 1 .  Blood Glucose Calibration (TRUE METRIX LEVEL 1) Low SOLN, Use as directed  Dx:e11.22, Disp: 1 each, Rfl: 2 .  Blood Glucose Monitoring Suppl (TRUE METRIX METER) w/Device KIT, Use as directed to check blood sugars 2 times per day dx: e11.22, Disp: 1 kit, Rfl: 1 .  carvedilol (COREG) 25 MG tablet, TAKE 1 TABLET BY MOUTH TWICE DAILY WITH MEALS, Disp: 180 tablet, Rfl: 0 .  Cholecalciferol (VITAMIN D-3) 1000 units CAPS, Take 5,000 Units by mouth daily. , Disp: , Rfl:  .  dexlansoprazole (DEXILANT) 60 MG capsule, Take 1 capsule (60 mg total) by mouth daily.,  Disp: 30 capsule, Rfl: 0 .  diclofenac sodium (VOLTAREN) 1 % GEL, Apply 2 g topically 4 (four) times daily as needed (pain)., Disp: , Rfl:  .  ferrous sulfate 324 MG TBEC, Take 324 mg by mouth., Disp: , Rfl:  .  furosemide (LASIX) 80 MG tablet, Take 80 mg by mouth daily. 23m daily Monday-Friday, 456mon Saturday and Sunday, Disp: , Rfl:  .  hydrALAZINE (APRESOLINE) 25 MG tablet, Take 3 tablets (75 mg total) by mouth 3 (three) times daily. (Patient taking differently: Take 75 mg by mouth in the morning and at bedtime. ), Disp: 270 tablet, Rfl: 3 .  hydrocortisone  cream 1 %, Apply 1 application topically daily as needed for itching. , Disp: , Rfl:  .  insulin lispro (HUMALOG KWIKPEN) 100 UNIT/ML KwikPen, Inject 0.02-0.04 mLs (2-4 Units total) into the skin 3 (three) times daily as needed (high blood sugar over 150)., Disp: 15 mL, Rfl: 2 .  Isopropyl Alcohol (ALCOHOL WIPES) 70 % MISC, Apply 1 each topically 2 (two) times daily., Disp: 300 each, Rfl: 3 .  isosorbide mononitrate (IMDUR) 30 MG 24 hr tablet, Take 1 tablet (30 mg total) by mouth daily., Disp: 30 tablet, Rfl: 11 .  lamoTRIgine (LAMICTAL) 150 MG tablet, Take 1 tablet (150 mg total) by mouth 2 (two) times daily., Disp: 180 tablet, Rfl: 3 .  Menthol, Topical Analgesic, (STOPAIN ROLL-ON EX), Apply 1 application topically daily as needed (pain)., Disp: , Rfl:  .  neomycin-bacitracin-polymyxin (NEOSPORIN) ointment, Apply 1 application topically as needed for wound care. , Disp: , Rfl:  .  OZEMPIC, 1 MG/DOSE, 4 MG/3ML SOPN, INJECT 1MG INTO THE SKIN ONCE A WEEK, Disp: 9 mL, Rfl: 2 .  Pancrelipase, Lip-Prot-Amyl, (ZENPEP) 25000-79000 units CPEP, Take 1-2 capsules by mouth See admin instructions. Take 2 capsules in the morning, 1 capsule with lunch, and 2 capsules in the evening., Disp: , Rfl:  .  potassium chloride SA (KLOR-CON) 20 MEQ tablet, TAKE 2 TABLETS EVERY DAY BUT WHEN TAKING METOLAZONE TAKE 3 TABLETS DAILY AS DIRECTED, Disp: 204 tablet, Rfl:  2 .  sennosides-docusate sodium (SENOKOT-S) 8.6-50 MG tablet, Take 1 tablet by mouth daily., Disp: , Rfl:  .  sertraline (ZOLOFT) 50 MG tablet, Take 1 tablet (50 mg total) by mouth daily., Disp: 90 tablet, Rfl: 2 .  tolnaftate (TINACTIN) 1 % cream, Apply 1 application topically daily as needed (foot fungus). , Disp: , Rfl:  .  TOUJEO SOLOSTAR 300 UNIT/ML Solostar Pen, INJECT 36 UNITS INTO THE SKIN AT BEDTIME. (Patient taking differently: 34 Units. ), Disp: 9 pen, Rfl: 2 .  zinc sulfate 220 (50 Zn) MG capsule, Take 220 mg by mouth daily., Disp: , Rfl:  .  glucose blood (TRUE METRIX BLOOD GLUCOSE TEST) test strip, Use as directed to check blood sugars 2 times per day dx: e11.22, Disp: 300 each, Rfl: 2 .  levETIRAcetam (KEPPRA) 500 MG tablet, TAKE 2 TABLETS AT BEDTIME, Disp: 180 tablet, Rfl: 0 .  metolazone (ZAROXOLYN) 2.5 MG tablet, , Disp: , Rfl:  .  ondansetron (ZOFRAN) 4 MG tablet, , Disp: , Rfl:  .  TRUEplus Lancets 33G MISC, Use as directed to check blood sugars 2 times per day dx: e11.22, Disp: 300 each, Rfl: 2   Allergies  Allergen Reactions  . Other Anaphylaxis and Rash    Bleach  . Penicillins Hives    Did it involve swelling of the face/tongue/throat, SOB, or low BP? No Did it involve sudden or severe rash/hives, skin peeling, or any reaction on the inside of your mouth or nose? Yes Did you need to seek medical attention at a hospital or doctor's office? Yes When did it last happen?Childhood allergy  If all above answers are "NO", may proceed with cephalosporin use.    . Sulfa Antibiotics Hives  . Aspirin Other (See Comments)     On aspirin 81 mg - Rectal bleeding in dec 2018  . Codeine     Headache and makes the patient feel "off"     Review of Systems  Constitutional: Negative.   Eyes: Negative for blurred vision.  Respiratory: Negative.  Negative for shortness  of breath.   Cardiovascular: Negative.  Negative for chest pain and palpitations.  Gastrointestinal:  Positive for anal bleeding.       Sister adds that pt has had some rectal bleeding. No associated abdominal discomfort.   Neurological: Negative.      Today's Vitals   03/22/20 1120  BP: 118/60  Pulse: 66  Temp: 97.9 F (36.6 C)  TempSrc: Oral  Weight: 215 lb 9.6 oz (97.8 kg)  Height: 4' 11.6" (1.514 m)   Body mass index is 42.67 kg/m.   Objective:  Physical Exam Vitals and nursing note reviewed.  Constitutional:      Appearance: Normal appearance. She is obese.  HENT:     Head: Normocephalic and atraumatic.  Cardiovascular:     Rate and Rhythm: Normal rate and regular rhythm.     Heart sounds: Normal heart sounds.  Pulmonary:     Effort: Pulmonary effort is normal.     Breath sounds: Normal breath sounds.  Musculoskeletal:     Right lower leg: Edema present.     Left lower leg: Edema present.  Skin:    General: Skin is warm.  Neurological:     General: No focal deficit present.     Mental Status: She is alert.  Psychiatric:        Mood and Affect: Mood normal.        Behavior: Behavior normal.         Assessment And Plan:     1. Type 2 diabetes mellitus with stage 2 chronic kidney disease, with long-term current use of insulin (HCC) Comments: Chronic, she will continue with current meds. Encouraged to notify me if her sugars are above 200 consistently.   2. Hypertensive heart disease with chronic diastolic congestive heart failure ([place]) Comments: Chronic, well controlled. She will continue with current meds. She is encouraged to avoid adding salt to her foods.   3. Chronic respiratory failure with hypoxia (HCC) Comments: Compliance with oxygen supplementation was discussed with the patient.   4. Other iron deficiency anemia Comments: Recent bloodwork reviewed.   5. Rectal bleeding Comments: I will refer her to GI, Dr. Paulita Fujita as requested.  - Ambulatory referral to Gastroenterology  6. Class 3 severe obesity due to excess calories with serious  comorbidity and body mass index (BMI) of 40.0 to 44.9 in adult [facility] Comments: Encouraged to perform chair exercises and also advised to increase daily activity as tolerated.   7. Personal history of covid-19 She is encouraged to strive for BMI less than 30 to decrease cardiac risk. Advised to aim for at least 150 minutes of exercise per week. Wt Readings from Last 3 Encounters:  03/22/20 215 lb 9.6 oz (97.8 kg)  03/09/20 219 lb 3.2 oz (99.4 kg)  03/09/20 217 lb 9.6 oz (98.7 kg)      Patient was given opportunity to ask questions. Patient verbalized understanding of the plan and was able to repeat key elements of the plan. All questions were answered to their satisfaction.  [name], MD   I, [name], MD, have reviewed all documentation for this visit. The documentation on 04/09/20 for the exam, diagnosis, procedures, and orders are all accurate and complete.  THE PATIENT IS ENCOURAGED TO PRACTICE SOCIAL DISTANCING DUE TO THE COVID-19 PANDEMIC.

## 2020-03-22 NOTE — Patient Instructions (Signed)
Diabetes Mellitus and Exercise Exercising regularly is important for your overall health, especially when you have diabetes (diabetes mellitus). Exercising is not only about losing weight. It has many other health benefits, such as increasing muscle strength and bone density and reducing body fat and stress. This leads to improved fitness, flexibility, and endurance, all of which result in better overall health. Exercise has additional benefits for people with diabetes, including:  Reducing appetite.  Helping to lower and control blood glucose.  Lowering blood pressure.  Helping to control amounts of fatty substances (lipids) in the blood, such as cholesterol and triglycerides.  Helping the body to respond better to insulin (improving insulin sensitivity).  Reducing how much insulin the body needs.  Decreasing the risk for heart disease by: ? Lowering cholesterol and triglyceride levels. ? Increasing the levels of good cholesterol. ? Lowering blood glucose levels. What is my activity plan? Your health care provider or certified diabetes educator can help you make a plan for the type and frequency of exercise (activity plan) that works for you. Make sure that you:  Do at least 150 minutes of moderate-intensity or vigorous-intensity exercise each week. This could be brisk walking, biking, or water aerobics. ? Do stretching and strength exercises, such as yoga or weightlifting, at least 2 times a week. ? Spread out your activity over at least 3 days of the week.  Get some form of physical activity every day. ? Do not go more than 2 days in a row without some kind of physical activity. ? Avoid being inactive for more than 30 minutes at a time. Take frequent breaks to walk or stretch.  Choose a type of exercise or activity that you enjoy, and set realistic goals.  Start slowly, and gradually increase the intensity of your exercise over time. What do I need to know about managing my  diabetes?   Check your blood glucose before and after exercising. ? If your blood glucose is 240 mg/dL (13.3 mmol/L) or higher before you exercise, check your urine for ketones. If you have ketones in your urine, do not exercise until your blood glucose returns to normal. ? If your blood glucose is 100 mg/dL (5.6 mmol/L) or lower, eat a snack containing 15-20 grams of carbohydrate. Check your blood glucose 15 minutes after the snack to make sure that your level is above 100 mg/dL (5.6 mmol/L) before you start your exercise.  Know the symptoms of low blood glucose (hypoglycemia) and how to treat it. Your risk for hypoglycemia increases during and after exercise. Common symptoms of hypoglycemia can include: ? Hunger. ? Anxiety. ? Sweating and feeling clammy. ? Confusion. ? Dizziness or feeling light-headed. ? Increased heart rate or palpitations. ? Blurry vision. ? Tingling or numbness around the mouth, lips, or tongue. ? Tremors or shakes. ? Irritability.  Keep a rapid-acting carbohydrate snack available before, during, and after exercise to help prevent or treat hypoglycemia.  Avoid injecting insulin into areas of the body that are going to be exercised. For example, avoid injecting insulin into: ? The arms, when playing tennis. ? The legs, when jogging.  Keep records of your exercise habits. Doing this can help you and your health care provider adjust your diabetes management plan as needed. Write down: ? Food that you eat before and after you exercise. ? Blood glucose levels before and after you exercise. ? The type and amount of exercise you have done. ? When your insulin is expected to peak, if you use   insulin. Avoid exercising at times when your insulin is peaking.  When you start a new exercise or activity, work with your health care provider to make sure the activity is safe for you, and to adjust your insulin, medicines, or food intake as needed.  Drink plenty of water while  you exercise to prevent dehydration or heat stroke. Drink enough fluid to keep your urine clear or pale yellow. Summary  Exercising regularly is important for your overall health, especially when you have diabetes (diabetes mellitus).  Exercising has many health benefits, such as increasing muscle strength and bone density and reducing body fat and stress.  Your health care provider or certified diabetes educator can help you make a plan for the type and frequency of exercise (activity plan) that works for you.  When you start a new exercise or activity, work with your health care provider to make sure the activity is safe for you, and to adjust your insulin, medicines, or food intake as needed. This information is not intended to replace advice given to you by your health care provider. Make sure you discuss any questions you have with your health care provider. Document Revised: 01/03/2017 Document Reviewed: 11/21/2015 Elsevier Patient Education  2020 [employer]  

## 2020-03-24 ENCOUNTER — Telehealth (HOSPITAL_COMMUNITY): Payer: Self-pay

## 2020-03-24 DIAGNOSIS — U071 COVID-19: Secondary | ICD-10-CM | POA: Diagnosis not present

## 2020-03-24 NOTE — Telephone Encounter (Signed)
Malena Edman, RN  03/24/2020 4:28 PM EDT Back to Top    Attempted to call patient, no answer, unable to leave message. Letter mailed to address on file   Malena Edman, RN  03/23/2020 9:52 AM EDT     Attempted to call patient, no answer, unable to leave message   Valeda Malm, RN  03/22/2020 9:28 AM EDT     Attempted to call patient, no answer, unable to leave message   Larey Dresser, MD  03/21/2020 4:58 PM EDT     RV remains dysfunctional. Make sure she has followup soon.

## 2020-03-28 ENCOUNTER — Telehealth: Payer: Self-pay

## 2020-03-29 ENCOUNTER — Other Ambulatory Visit: Payer: Self-pay | Admitting: Neurology

## 2020-03-29 ENCOUNTER — Ambulatory Visit
Admission: RE | Admit: 2020-03-29 | Discharge: 2020-03-29 | Disposition: A | Discharge status: Home / Self Care | Payer: Medicare HMO | Source: Ambulatory Visit | Attending: Pulmonary Disease | Admitting: Pulmonary Disease

## 2020-03-29 DIAGNOSIS — I700 Atherosclerosis of aorta: Secondary | ICD-10-CM | POA: Diagnosis not present

## 2020-03-29 DIAGNOSIS — R059 Cough, unspecified: Secondary | ICD-10-CM | POA: Diagnosis not present

## 2020-03-29 DIAGNOSIS — R0609 Other forms of dyspnea: Secondary | ICD-10-CM

## 2020-04-04 ENCOUNTER — Telehealth: Payer: Self-pay

## 2020-04-04 ENCOUNTER — Telehealth: Payer: Medicare HMO

## 2020-04-04 NOTE — Telephone Encounter (Cosign Needed)
  Chronic Care Management   Outreach Note  04/04/2020 Name: [name] MRN: [id number] DOB: 01/26/1946  Referred by: [name], MD Reason for referral : Chronic Care Management (CCM RNCM FU Call )   An unsuccessful telephone outreach was attempted today. The patient was referred to the case management team for assistance with care management and care coordination.   Follow Up Plan: A HIPAA compliant phone message was left for the patient providing contact information and requesting a return call.  Telephone follow up appointment with care management team member scheduled for: 05/06/20  Barb Merino, RN, BSN, CCM Care Management Coordinator [place] [facility]  Direct Phone: [phone]

## 2020-04-05 ENCOUNTER — Ambulatory Visit: Payer: Self-pay

## 2020-04-05 ENCOUNTER — Telehealth: Payer: Medicare HMO

## 2020-04-05 DIAGNOSIS — G40909 Epilepsy, unspecified, not intractable, without status epilepticus: Secondary | ICD-10-CM

## 2020-04-05 DIAGNOSIS — N182 Chronic kidney disease, stage 2 (mild): Secondary | ICD-10-CM

## 2020-04-05 DIAGNOSIS — E1122 Type 2 diabetes mellitus with diabetic chronic kidney disease: Secondary | ICD-10-CM

## 2020-04-05 DIAGNOSIS — I5032 Chronic diastolic (congestive) heart failure: Secondary | ICD-10-CM

## 2020-04-05 NOTE — Chronic Care Management (AMB) (Addendum)
Chronic Care Management   Follow Up Note   04/05/2020 Name: [name] MRN: [id number] DOB: [birth year]  Referred by: [name], MD Reason for referral : Chronic Care Management (CCM RNCM FU Call)   [name] is a 74 y.o. year old female who is a primary care patient of [name], MD. The CCM team was consulted for assistance with chronic disease management and care coordination needs.    Review of patient status, including review of consultants reports, relevant laboratory and other test results, and collaboration with appropriate care team members and the patient's provider was performed as part of comprehensive patient evaluation and provision of chronic care management services.    SDOH (Social Determinants of Health) assessments performed: No See Care Plan activities for detailed interventions related to [facility])   Inbound return call from sister [name] with a CCM RN CM update.     Outpatient Encounter Medications as of 04/05/2020  Medication Sig  . acetaminophen (TYLENOL) 500 MG tablet Take 500-1,000 mg by mouth every 4 (four) hours as needed for moderate pain.   Marland Kitchen albuterol (VENTOLIN HFA) 108 (90 Base) MCG/ACT inhaler Inhale 1-2 puffs into the lungs every 6 (six) hours as needed for wheezing or shortness of breath.  . Alcohol Swabs (ALCOHOL PADS) 70 % PADS Use as directed dx: e11.22  . Ascorbic Acid (VITAMIN C WITH ROSE HIPS) 1000 MG tablet Take 1,000 mg by mouth daily.  Marland Kitchen aspirin 81 MG chewable tablet Chew 1 tablet (81 mg total) by mouth daily.  Marland Kitchen atorvastatin (LIPITOR) 10 MG tablet Take 1 tablet (10 mg total) by mouth every evening.  . B-D ULTRAFINE III SHORT PEN 31G X 8 MM MISC USE DAILY WITH VICTOZA AND/OR NOVOLOG.  . Blood Glucose Calibration (TRUE METRIX LEVEL 1) Low SOLN Use as directed  Dx:e11.22  . Blood Glucose Monitoring Suppl (TRUE METRIX METER) w/Device KIT Use as directed to check blood sugars 2 times per day dx: e11.22  . carvedilol (COREG) 25  MG tablet TAKE 1 TABLET BY MOUTH TWICE DAILY WITH MEALS  . Cholecalciferol (VITAMIN D-3) 1000 units CAPS Take 5,000 Units by mouth daily.   Marland Kitchen dexlansoprazole (DEXILANT) 60 MG capsule Take 1 capsule (60 mg total) by mouth daily.  . diclofenac sodium (VOLTAREN) 1 % GEL Apply 2 g topically 4 (four) times daily as needed (pain).  . ferrous sulfate 324 MG TBEC Take 324 mg by mouth.  . furosemide (LASIX) 80 MG tablet Take 80 mg by mouth daily. 33m daily Monday-Friday, 476mon Saturday and Sunday  . glucose blood (TRUE METRIX BLOOD GLUCOSE TEST) test strip Use as directed to check blood sugars 2 times per day dx: e11.22  . hydrALAZINE (APRESOLINE) 25 MG tablet Take 3 tablets (75 mg total) by mouth 3 (three) times daily. (Patient taking differently: Take 75 mg by mouth in the morning and at bedtime. )  . hydrocortisone cream 1 % Apply 1 application topically daily as needed for itching.   . insulin lispro (HUMALOG KWIKPEN) 100 UNIT/ML KwikPen Inject 0.02-0.04 mLs (2-4 Units total) into the skin 3 (three) times daily as needed (high blood sugar over 150).  . Isopropyl Alcohol (ALCOHOL WIPES) 70 % MISC Apply 1 each topically 2 (two) times daily.  . isosorbide mononitrate (IMDUR) 30 MG 24 hr tablet Take 1 tablet (30 mg total) by mouth daily.  . Marland KitchenamoTRIgine (LAMICTAL) 150 MG tablet Take 1 tablet (150 mg total) by mouth 2 (two) times daily.  . Marland KitchenevETIRAcetam (KEPPRA)  500 MG tablet TAKE 2 TABLETS AT BEDTIME  . Menthol, Topical Analgesic, (STOPAIN ROLL-ON EX) Apply 1 application topically daily as needed (pain).  Marland Kitchen metolazone (ZAROXOLYN) 2.5 MG tablet   . neomycin-bacitracin-polymyxin (NEOSPORIN) ointment Apply 1 application topically as needed for wound care.   . ondansetron (ZOFRAN) 4 MG tablet   . OZEMPIC, 1 MG/DOSE, 4 MG/3ML SOPN INJECT $RemoveBef'1MG'QkYwDeTHwo$  INTO THE SKIN ONCE A WEEK  . Pancrelipase, Lip-Prot-Amyl, (ZENPEP) 25000-79000 units CPEP Take 1-2 capsules by mouth See admin instructions. Take 2 capsules in the  morning, 1 capsule with lunch, and 2 capsules in the evening.  . potassium chloride SA (KLOR-CON) 20 MEQ tablet TAKE 2 TABLETS EVERY DAY BUT WHEN TAKING METOLAZONE TAKE 3 TABLETS DAILY AS DIRECTED  . sennosides-docusate sodium (SENOKOT-S) 8.6-50 MG tablet Take 1 tablet by mouth daily.  . sertraline (ZOLOFT) 50 MG tablet Take 1 tablet (50 mg total) by mouth daily.  Marland Kitchen tolnaftate (TINACTIN) 1 % cream Apply 1 application topically daily as needed (foot fungus).   [name] SOLOSTAR 300 UNIT/ML Solostar Pen INJECT 36 UNITS INTO THE SKIN AT BEDTIME. (Patient taking differently: 34 Units. )  . TRUEplus Lancets 33G MISC Use as directed to check blood sugars 2 times per day dx: e11.22  . zinc sulfate 220 (50 Zn) MG capsule Take 220 mg by mouth daily.   No facility-administered encounter medications on file as of 04/05/2020.     Objective:  Lab Results  Component Value Date   HGBA1C 7.6 (H) 11/09/2019   HGBA1C 8.0 (H) 08/24/2019   HGBA1C 7.5 (H) 05/19/2019   Lab Results  Component Value Date   MICROALBUR 10 01/06/2020   LDLCALC 65 12/17/2019   CREATININE 0.97 03/09/2020   BP Readings from Last 3 Encounters:  03/22/20 118/60  03/09/20 140/78  03/09/20 122/78    Goals Addressed      Patient Stated   .  COMPLETED: "to recover from [place] without complications" (pt-stated)        Sister and patient stated  CARE PLAN ENTRY (see longitudinal plan of care for additional care plan information)  Current Barriers:  Marland Kitchen Knowledge Deficits related to Care Coordination needs for monoclonal antibody infusion to minimize symptoms of COVID 19 . Chronic Disease Management support and education needs related to DM II, CHF, Seizure disorder   Nurse Case Manager Clinical Goal(s):  Marland Kitchen Over the next 3 days, patient will verbalize understanding of plan for receiving Regeneron antibody infusion for COVID 19  Goal Met . 03/07/20 New Over the next 14 days, patient will be evaluated by MD and or hospitalist for  evaluation of COVID related symptoms and hypoxia Goal Met  . 03/07/20 New Over the next 7 days, patient will have a Remote Health RN home visit scheduled for evaluation and treatment for skilled nurse needs for supportive care of COVID symptoms Goal Met   CCM RN CM Interventions:  04/05/20 call completed with sister [name] . Inter-disciplinary care team collaboration (see longitudinal plan of care) . Evaluation of current treatment plan related to COVID 19 and patient's adherence to plan as established by provider . Determined patient has made a full recovery from having COVID 19 after being hospitalized and receiving monoclonial antibody infusion . Determined patient has completed her home health and has completed her post d/c f/u visits with the PCP, Cardiology and Pulmonology  . Discussed Dr. Baird Cancer supports patient's sister calling EMS to have patient transported to [facility] for further evaluation, sister notified and agreed  Patient Self Care Activities:  . Attends all scheduled provider appointments . Calls provider office for new concerns or questions . Supportive sister to assist with care needs  Please see past updates related to this goal by clicking on the "Past Updates" button in the selected goal      .  COMPLETED: I would like to manage and optimize my medications for my chronic conditions (pt-stated)        Current Barriers:  . Diabetes: T2DM; most recent A1c 7.5% on 11/24 (was 8.5% on 02/24/19)  . Current antihyperglycemic regimen: Ozempic, Toujeo 34 units qHS o 07/06/2019-Accucheck Aviva glucometer-->test strips and supplies called in for 1 year ([employer] mail order) . denies hypoglycemic symptoms; denies hyperglycemic symptoms . Current meal patterns: . Current exercise: n/a; motivated patient to participate in exercise as able and as recommended by PCP . Current blood glucose readings: FBG 110-130s.  This has improved! . Cardiovascular risk reduction: o Current  hypertensive regimen: carvedilol, hydralazine, isosorbide o Current hyperlipidemia regimen:  atorvastatin $RemoveBefor'10mg'pvjEwHGLNzLw$  (last filled #90 on 12/24/18) o Of note, patient is on lasix for CHF.  Reviewed new cardiology dosing with patient per chart. Her weights have gone from 206-->203,204.  Encouraged her to call cardiologist if needs arise.  She states her SOB/pain have decreased.  She has follow up on 03/31/19  Pharmacist Clinical Goal(s):  Marland Kitchen Over the next 90 days, patient with work with PharmD and primary care provider to address needs related to optimization of medication management of chronic conditions (diabetes).  Interventions: . Comprehensive medication review performed, medication list updated in electronic medical record.  Reviewed & discussed the following diabetes-related information with patient: o Reviewed ADA recommended "diabetes-friendly" diet  (reviewed healthy snack/food options) o Discussed GLP-1 injection technique; Patient uses AccuCheck glucometer o Reviewed medication purpose/side effects-->patient denies adverse events  Patient Self Care Activities:  . Patient will check blood glucose daily , document, and provide at future appointments . Patient will focus on medication adherence by continuing to take medications as prescribed. . Patient will take medications as prescribed . Patient will contact provider with any episodes of hypoglycemia . Patient will report any questions or concerns to provider   Please see past updates related to this goal by clicking on the "Past Updates" button in the selected goal        Other   .  "she's having some bizarre behaviors"        Sister stated [place] (see longitudinal plan of care for additional care plan information)  Current Barriers:  Marland Kitchen Knowledge Deficits related to evaluation and treatment of cognitive changes with mood change . Chronic Disease Management support and education needs related to DM II, CHF, Seizure  disorder . Cognitive Deficits  Nurse Case Manager Clinical Goal(s):  Marland Kitchen Over the next 45 days, patient will verbalize understanding of plan for evaluation and treatment of cognitive changes with mood disorder  CCM RN CM Interventions:  04/05/20 call completed with sister [name]  . Inter-disciplinary care team collaboration (see longitudinal plan of care) . Evaluation of current treatment plan related to Cognitive changes w/mood change and patient's adherence to plan as established by provider . Determined sister [name] is noticing some "bizarre" behaviors from the patient along with agitation, and paranoia  . Determined sister [name] is unsure if Ms. Heming is taking her medications correctly as she will not allow [name] to supervise or participate with administering her medications . Discussed Ms. Walthall becomes very hostile and angry when  her medications are discussed with her sister and or the home health nurse who visited lately . Instructed sister [name] to schedule a f/u visit for patient to f/u with [facility] Neurology for further evalation . Sent in basket message to embedded Pharmacy technician Pattricia Boss requesting she contact the patient with permission to include sister [name] on the call to review her medications and educate on use of the pill package system  . Sent in basket message to PCP, Dr. [name] regarding sister's concerns  . Discussed plans with patient for ongoing care management follow up and provided patient with direct contact information for care management team  Patient Self Care Activities:  . Attends all scheduled provider appointments . Calls pharmacy for medication refills . Supportive sister to assist with care needs  Initial goal documentation       Plan:   Telephone follow up appointment with care management team member scheduled for: 05/06/20  Barb Merino, RN, BSN, CCM Care Management Coordinator [place] [facility]  Direct Phone: [phone]

## 2020-04-05 NOTE — Patient Instructions (Addendum)
Visit Information  Goals Addressed      Patient Stated   .  COMPLETED: "to recover from [place] without complications" (pt-stated)        Sister and patient stated  CARE PLAN ENTRY (see longitudinal plan of care for additional care plan information)  Current Barriers:  Marland Kitchen Knowledge Deficits related to Care Coordination needs for monoclonal antibody infusion to minimize symptoms of COVID 19 . Chronic Disease Management support and education needs related to DM II, CHF, Seizure disorder   Nurse Case Manager Clinical Goal(s):  Marland Kitchen Over the next 3 days, patient will verbalize understanding of plan for receiving Regeneron antibody infusion for COVID 19  Goal Met . 03/07/20 New Over the next 14 days, patient will be evaluated by MD and or hospitalist for evaluation of COVID related symptoms and hypoxia Goal Met  . 03/07/20 New Over the next 7 days, patient will have a Remote Health RN home visit scheduled for evaluation and treatment for skilled nurse needs for supportive care of COVID symptoms Goal Met   CCM RN CM Interventions:  04/05/20 call completed with sister [name] . Inter-disciplinary care team collaboration (see longitudinal plan of care) . Evaluation of current treatment plan related to COVID 19 and patient's adherence to plan as established by provider . Determined patient has made a full recovery from having COVID 19 after being hospitalized and receiving monoclonial antibody infusion . Determined patient has completed her home health and has completed her post d/c f/u visits with the PCP, Cardiology and Pulmonology  . Discussed Dr. Baird Cancer supports patient's sister calling EMS to have patient transported to [facility] for further evaluation, sister notified and agreed   Patient Self Care Activities:  . Attends all scheduled provider appointments . Calls provider office for new concerns or questions . Supportive sister to assist with care needs  Please see past updates related to  this goal by clicking on the "Past Updates" button in the selected goal      .  COMPLETED: I would like to manage and optimize my medications for my chronic conditions (pt-stated)        Current Barriers:  . Diabetes: T2DM; most recent A1c 7.5% on 11/24 (was 8.5% on 02/24/19)  . Current antihyperglycemic regimen: Ozempic, Toujeo 34 units qHS o 07/06/2019-Accucheck Aviva glucometer-->test strips and supplies called in for 1 year ([employer] mail order) . denies hypoglycemic symptoms; denies hyperglycemic symptoms . Current meal patterns: . Current exercise: n/a; motivated patient to participate in exercise as able and as recommended by PCP . Current blood glucose readings: FBG 110-130s.  This has improved! . Cardiovascular risk reduction: o Current hypertensive regimen: carvedilol, hydralazine, isosorbide o Current hyperlipidemia regimen:  atorvastatin 37m (last filled #90 on 12/24/18) o Of note, patient is on lasix for CHF.  Reviewed new cardiology dosing with patient per chart. Her weights have gone from 206-->203,204.  Encouraged her to call cardiologist if needs arise.  She states her SOB/pain have decreased.  She has follow up on 03/31/19  Pharmacist Clinical Goal(s):  .Marland KitchenOver the next 90 days, patient with work with PharmD and primary care provider to address needs related to optimization of medication management of chronic conditions (diabetes).  Interventions: . Comprehensive medication review performed, medication list updated in electronic medical record.  Reviewed & discussed the following diabetes-related information with patient: o Reviewed ADA recommended "diabetes-friendly" diet  (reviewed healthy snack/food options) o Discussed GLP-1 injection technique; Patient uses AccuCheck glucometer o Reviewed medication purpose/side effects-->patient denies  adverse events  Patient Self Care Activities:  . Patient will check blood glucose daily , document, and provide at future  appointments . Patient will focus on medication adherence by continuing to take medications as prescribed. . Patient will take medications as prescribed . Patient will contact provider with any episodes of hypoglycemia . Patient will report any questions or concerns to provider   Please see past updates related to this goal by clicking on the "Past Updates" button in the selected goal        Other   .  "she's having some bizarre behaviors"        Sister stated [place] (see longitudinal plan of care for additional care plan information)  Current Barriers:  Marland Kitchen Knowledge Deficits related to evaluation and treatment of cognitive changes with mood change . Chronic Disease Management support and education needs related to DM II, CHF, Seizure disorder . Cognitive Deficits  Nurse Case Manager Clinical Goal(s):  Marland Kitchen Over the next 45 days, patient will verbalize understanding of plan for evaluation and treatment of cognitive changes with mood disorder  CCM RN CM Interventions:  04/05/20 call completed with sister [name]  . Inter-disciplinary care team collaboration (see longitudinal plan of care) . Evaluation of current treatment plan related to Cognitive changes w/mood change and patient's adherence to plan as established by provider . Determined sister [name] is noticing some "bizarre" behaviors from the patient along with agitation, and paranoia  . Determined sister [name] is unsure if [name] is taking her medications correctly as she will not allow [name] to supervise or participate with administering her medications . Discussed Ms. Stay becomes very hostile and angry when her medications are discussed with her sister and or the home health nurse who visited lately . Instructed sister [name] to schedule a f/u visit for patient to f/u with [facility] Neurology for further evalation . Sent in basket message to embedded Pharmacy technician Pattricia Boss requesting she contact the  patient with permission to include sister [name] on the call to review her medications and educate on use of the pill package system  . Sent in basket message to PCP, Dr. [name] regarding sister's concerns  . Discussed plans with patient for ongoing care management follow up and provided patient with direct contact information for care management team  Patient Self Care Activities:  . Attends all scheduled provider appointments . Calls pharmacy for medication refills . Supportive sister to assist with care needs  Initial goal documentation       Patient verbalizes understanding of instructions provided today.   Telephone follow up appointment with care management team member scheduled for: 05/06/20  Barb Merino, RN, BSN, CCM Care Management Coordinator [place] [facility]  Direct Phone: [phone]

## 2020-04-10 ENCOUNTER — Telehealth: Payer: Self-pay | Admitting: Pharmacist

## 2020-04-10 NOTE — Chronic Care Management (AMB) (Signed)
Chronic Care Management Pharmacy Assistant   Name: [name]  MRN: [id number] DOB: [birth year]  Reason for Encounter: Medication Review/ Coordination of Enhanced Pharmacy Services.  Patient Questions:  1.  Have you seen any other providers since your last visit? Yes, 03/21/20- Echocardiogram [facility]), 03/22/20- Dr. Baird Cancer (PCP), 03/29/20- CT Chest [place] [facility]), 04/05/20- Barb Merino, RN (CCM Nurse).  2.  Any changes in your medicines or health? Yes, B complex, Multivitamins, Polyethylene Glycol discontinued on 03/22/20. Levetiracetam changed from 1000 mg once daily to 500 mg 2 tablets at bedtime.  PCP : [name], MD  Allergies:   Allergies  Allergen Reactions  . Other Anaphylaxis and Rash    Bleach  . Penicillins Hives    Did it involve swelling of the face/tongue/throat, SOB, or low BP? No Did it involve sudden or severe rash/hives, skin peeling, or any reaction on the inside of your mouth or nose? Yes Did you need to seek medical attention at a hospital or doctor's office? Yes When did it last happen?Childhood allergy  If all above answers are "NO", may proceed with cephalosporin use.    . Sulfa Antibiotics Hives  . Aspirin Other (See Comments)     On aspirin 81 mg - Rectal bleeding in dec 2018  . Codeine     Headache and makes the patient feel "off"    Medications: Outpatient Encounter Medications as of 04/10/2020  Medication Sig  . acetaminophen (TYLENOL) 500 MG tablet Take 500-1,000 mg by mouth every 4 (four) hours as needed for moderate pain.   Marland Kitchen albuterol (VENTOLIN HFA) 108 (90 Base) MCG/ACT inhaler Inhale 1-2 puffs into the lungs every 6 (six) hours as needed for wheezing or shortness of breath.  . Alcohol Swabs (ALCOHOL PADS) 70 % PADS Use as directed dx: e11.22  . Ascorbic Acid (VITAMIN C WITH ROSE HIPS) 1000 MG tablet Take 1,000 mg by mouth daily.  Marland Kitchen aspirin 81 MG chewable tablet Chew 1 tablet (81 mg total) by mouth daily.    Marland Kitchen atorvastatin (LIPITOR) 10 MG tablet Take 1 tablet (10 mg total) by mouth every evening.  . B-D ULTRAFINE III SHORT PEN 31G X 8 MM MISC USE DAILY WITH VICTOZA AND/OR NOVOLOG.  . Blood Glucose Calibration (TRUE METRIX LEVEL 1) Low SOLN Use as directed  Dx:e11.22  . Blood Glucose Monitoring Suppl (TRUE METRIX METER) w/Device KIT Use as directed to check blood sugars 2 times per day dx: e11.22  . carvedilol (COREG) 25 MG tablet TAKE 1 TABLET BY MOUTH TWICE DAILY WITH MEALS  . Cholecalciferol (VITAMIN D-3) 1000 units CAPS Take 5,000 Units by mouth daily.   Marland Kitchen dexlansoprazole (DEXILANT) 60 MG capsule Take 1 capsule (60 mg total) by mouth daily.  . diclofenac sodium (VOLTAREN) 1 % GEL Apply 2 g topically 4 (four) times daily as needed (pain).  . ferrous sulfate 324 MG TBEC Take 324 mg by mouth.  . furosemide (LASIX) 80 MG tablet Take 80 mg by mouth daily. 49m daily Monday-Friday, 430mon Saturday and Sunday  . glucose blood (TRUE METRIX BLOOD GLUCOSE TEST) test strip Use as directed to check blood sugars 2 times per day dx: e11.22  . hydrALAZINE (APRESOLINE) 25 MG tablet Take 3 tablets (75 mg total) by mouth 3 (three) times daily. (Patient taking differently: Take 75 mg by mouth in the morning and at bedtime. )  . hydrocortisone cream 1 % Apply 1 application topically daily as needed for itching.   . insulin  lispro (HUMALOG KWIKPEN) 100 UNIT/ML KwikPen Inject 0.02-0.04 mLs (2-4 Units total) into the skin 3 (three) times daily as needed (high blood sugar over 150).  . Isopropyl Alcohol (ALCOHOL WIPES) 70 % MISC Apply 1 each topically 2 (two) times daily.  . isosorbide mononitrate (IMDUR) 30 MG 24 hr tablet Take 1 tablet (30 mg total) by mouth daily.  Marland Kitchen lamoTRIgine (LAMICTAL) 150 MG tablet Take 1 tablet (150 mg total) by mouth 2 (two) times daily.  Marland Kitchen levETIRAcetam (KEPPRA) 500 MG tablet TAKE 2 TABLETS AT BEDTIME  . Menthol, Topical Analgesic, (STOPAIN ROLL-ON EX) Apply 1 application topically daily as  needed (pain).  Marland Kitchen metolazone (ZAROXOLYN) 2.5 MG tablet   . neomycin-bacitracin-polymyxin (NEOSPORIN) ointment Apply 1 application topically as needed for wound care.   . ondansetron (ZOFRAN) 4 MG tablet   . OZEMPIC, 1 MG/DOSE, 4 MG/3ML SOPN INJECT 1MG INTO THE SKIN ONCE A WEEK  . Pancrelipase, Lip-Prot-Amyl, (ZENPEP) 25000-79000 units CPEP Take 1-2 capsules by mouth See admin instructions. Take 2 capsules in the morning, 1 capsule with lunch, and 2 capsules in the evening.  . potassium chloride SA (KLOR-CON) 20 MEQ tablet TAKE 2 TABLETS EVERY DAY BUT WHEN TAKING METOLAZONE TAKE 3 TABLETS DAILY AS DIRECTED  . sennosides-docusate sodium (SENOKOT-S) 8.6-50 MG tablet Take 1 tablet by mouth daily.  . sertraline (ZOLOFT) 50 MG tablet Take 1 tablet (50 mg total) by mouth daily.  Marland Kitchen tolnaftate (TINACTIN) 1 % cream Apply 1 application topically daily as needed (foot fungus).   [name] SOLOSTAR 300 UNIT/ML Solostar Pen INJECT 36 UNITS INTO THE SKIN AT BEDTIME. (Patient taking differently: 34 Units. )  . TRUEplus Lancets 33G MISC Use as directed to check blood sugars 2 times per day dx: e11.22  . zinc sulfate 220 (50 Zn) MG capsule Take 220 mg by mouth daily.   No facility-administered encounter medications on file as of 04/10/2020.    Current Diagnosis: Patient Active Problem List   Diagnosis Date Noted  . Chronic kidney disease, stage 2 (mild) 01/05/2020  . Hypertensive chronic kidney disease with stage 1 through stage 4 chronic kidney disease, or unspecified chronic kidney disease 01/05/2020  . Snoring 01/05/2020  . Neuroleptic-induced parkinsonism ([place]) 09/28/2019  . Class 3 severe obesity due to excess calories with serious comorbidity and body mass index (BMI) of 40.0 to 44.9 in adult [facility]) 05/22/2019  . Pulmonary hypertension, unspecified ([place]) 04/06/2019  . Syncope 02/24/2019  . Rheumatoid arthritis ([place]) 01/21/2019  . Radial styloid tenosynovitis 11/24/2018  . Type 2 diabetes mellitus with  stage 2 chronic kidney disease, with long-term current use of insulin ([place]) 07/21/2018  . Chest pain 05/15/2018  . 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 Excessive daytime sleepiness 01/30/2018  . Sleep-related hypoventilation 01/30/2018  . Recurrent depression ([place]) 11/06/2017  . OSA on CPAP 10/07/2017  . Todd's paralysis ([place])   . Chronic diastolic CHF (congestive heart failure), NYHA class 2 ([place])   . Chronic respiratory failure with hypoxia ([place])   . Acute lower GI bleeding   . Stroke-like symptoms 06/15/2017  . Rectal bleeding 06/15/2017  . Dehydration 03/07/2017  . Medication management 12/24/2016  . Near syncope 03/09/2016  . Racing heart beat 03/09/2016  . Spinal stenosis at L4-L5 level 10/04/2014  . Parkinsonism ([place]) 06/24/2013  . Lower abdominal pain 10/31/2012  . Type II diabetes mellitus, uncontrolled ([place]) 10/31/2012  . Seizure disorder ([place]) 10/31/2012  . Diverticulosis 10/31/2012  .  Anemia associated with acute blood loss 08/14/2012  . Hematochezia 08/14/2012  . Varicose veins of bilateral lower extremities with other complications 57/84/6962  . Leg pain 03/21/2012  . Radicular pain of left lower extremity 03/21/2012  . Heart murmur 11/21/2011  . Infected pseudocyst of pancreas 10/28/2011  . Chronic pancreatitis ([place]) 10/28/2011  . Hypertensive heart disease with chronic diastolic congestive heart failure ([place]) 10/28/2011  . Dyslipidemia, goal LDL below 100 10/28/2011  . Morbid obesity with BMI of 45.0-49.9, adult ([place]) 10/28/2011  . Pseudoseizure 10/24/2011  . Gastroesophageal reflux 10/23/2011  . History of tobacco abuse 10/23/2011  . Hypoxemia 10/23/2011  . Disturbance in sleep behavior 09/03/2011  . Partial epilepsy with impairment of consciousness ([place]) 09/03/2011  . Tremor 09/03/2011  . Peripheral edema 04/14/2011  . Obesity hypoventilation syndrome ([place]) 10/26/2010  . Atherosclerotic heart  disease of native coronary artery with other forms of angina pectoris ([place]) 08/28/2010  . Acute, but ill-defined, cerebrovascular disease 05/04/2008  . Arthropathy 05/04/2008  . Disequilibrium 05/04/2008  . Generalized tonic clonic epilepsy ([place]) 05/04/2008  . Increased frequency of urination 05/04/2008  . Insomnia, unspecified 05/04/2008  . Major depressive disorder, single episode, unspecified 05/04/2008  . Memory loss 05/04/2008  . Nausea 05/04/2008  . Other malaise and fatigue 05/04/2008  . Swelling of limb 05/04/2008   04/05/20- CCM Nurse Barb Merino visited with patient and sister [name], there are some concerns of mood changes and behavior changes and concerns that she is not taking medications correctly. Home health nurse recently suggested using a pill packaging system but patient refused. Nurse thinks it would be better suggested of [facility] Adherence program coming from pharmacist and assistant. [place], CPP of pharmacy services needed and ok for me to contact patient to start up services, to follow up on Neurology appointment and schedule a follow up with him if needed.  04/13/2020- [facility]stream Adherence Program onboard form completed by Judithann Sheen, CPA, awaiting return call from [name] (sister) to schedule a time for medication review to start adherence program dispensing.  04/22/2020- Called patient to discuss setting up a time for medication review. Spoke with patient, she began to explain to me that she gets her prescriptions from [facility] for 90 day supply at a time, inquired if they do auto-refill, but stated no, they call her first but when they call, she says yes to the request to refill but then later realizes she didn't need that medication. Patient now has an abundance of medication, more than 90 days. Inquired if she get her medication for free, patient stated yes and she was happy to hear about [facility] Adherence Packaging  Program but the cost would be more than she could handle. Informed patient that we do not want her to have any out of pocket cost and referral for services were suggested to help with medication management with all the med's she has. Patient aware and thankful, she feels like she has everything under control, she has a home health nurse - Lattie Haw- come out and puts on her bottles when she should take and what for, she fills her pill box up every Sunday. Patient keeps a record of her blood pressure, blood sugar, pulse, weight and oxygen levels. Patient states she is very organized, she may forget little things here and there but not a major concern. Patient aware that we will connect back next year to see if her insurance program has changed and if Upstream is covered to give he  the same benefits as [employer]. Patient also aware that she can contact me with any questions. Patient agrees with this plan and very thankful for the call.   Follow-Up:  Coordination of Enhanced Pharmacy Services and Pharmacist Review - Patient will not onboard at this time.  Leata Mouse, CPP notified.  Pattricia Boss, [place] Pharmacist Assistant [phone]

## 2020-04-10 NOTE — Telephone Encounter (Signed)
-----   Message from Lynne Logan, RN sent at 04/05/2020 11:11 PM EDT ----- Regarding: patient update Hello Dr. Baird Cancer,   I spoke with patient's sister [name] today. She is concerned about some bizarre behavior and mood change she is noticing from [place]. She is concerned that she may not be taking her medications correctly and [place] will not allow [name] to supervise or participate in anyway. She becomes hostile and defensive when [name] brings it up.   The home health nurse recently suggested [place] using the pill package system and she refused.   [name] believes that if you give [place] a call and suggest she allow our pharm team assist her with transitioning to the pill package system, she may agree because she thinks highly of you and your advise given.   Would you be able to give her a call? Do you think she needs a toxicity level checked for Seizure meds?   Felicity Coyer, if so, can you please assist the patient with transitioning over to a pill package system?  I have also instructed [name] to call [facility] to schedule a f/u appt since the patient was last seen in July and [name] feels the bizarre behavior is new.   Thanks!!  [employer]

## 2020-04-13 ENCOUNTER — Encounter: Payer: Self-pay | Admitting: *Deleted

## 2020-04-15 ENCOUNTER — Other Ambulatory Visit: Payer: Self-pay

## 2020-04-19 DIAGNOSIS — I5032 Chronic diastolic (congestive) heart failure: Secondary | ICD-10-CM | POA: Diagnosis not present

## 2020-04-19 DIAGNOSIS — U071 COVID-19: Secondary | ICD-10-CM | POA: Diagnosis not present

## 2020-04-19 DIAGNOSIS — R062 Wheezing: Secondary | ICD-10-CM | POA: Diagnosis not present

## 2020-04-19 DIAGNOSIS — G4733 Obstructive sleep apnea (adult) (pediatric): Secondary | ICD-10-CM | POA: Diagnosis not present

## 2020-04-19 DIAGNOSIS — J9621 Acute and chronic respiratory failure with hypoxia: Secondary | ICD-10-CM | POA: Diagnosis not present

## 2020-04-22 ENCOUNTER — Telehealth: Payer: Self-pay

## 2020-04-22 NOTE — Telephone Encounter (Signed)
I left the pt a message that Dr. Baird Cancer wanted to know has the pt been to see her GI Dr. Paulita Fujita.  The pt's stool cards results was positive for blood on one of her cards. If not when is her next appt with Dr. Paulita Fujita.

## 2020-04-24 DIAGNOSIS — U071 COVID-19: Secondary | ICD-10-CM | POA: Diagnosis not present

## 2020-04-25 ENCOUNTER — Telehealth: Payer: Self-pay

## 2020-04-25 NOTE — Telephone Encounter (Signed)
The pt called and left a message that she has an appt with Dr. Paulita Fujita her GI Dec 6th, the pt said she  could've been seen sooner but that she only wanted to see him and no one at his office.

## 2020-04-26 ENCOUNTER — Other Ambulatory Visit (INDEPENDENT_AMBULATORY_CARE_PROVIDER_SITE_OTHER): Payer: Medicare HMO

## 2020-04-26 DIAGNOSIS — K625 Hemorrhage of anus and rectum: Secondary | ICD-10-CM

## 2020-04-26 LAB — HEMOCCULT GUIAC POC 1CARD (OFFICE)
Card #2 Fecal Occult Blod, POC: NEGATIVE
Card #3 Fecal Occult Blood, POC: POSITIVE
Fecal Occult Blood, POC: NEGATIVE

## 2020-05-02 DIAGNOSIS — M79672 Pain in left foot: Secondary | ICD-10-CM | POA: Diagnosis not present

## 2020-05-02 DIAGNOSIS — R768 Other specified abnormal immunological findings in serum: Secondary | ICD-10-CM | POA: Diagnosis not present

## 2020-05-02 DIAGNOSIS — Z6841 Body Mass Index (BMI) 40.0 and over, adult: Secondary | ICD-10-CM | POA: Diagnosis not present

## 2020-05-02 DIAGNOSIS — M79671 Pain in right foot: Secondary | ICD-10-CM | POA: Diagnosis not present

## 2020-05-03 ENCOUNTER — Telehealth: Payer: Self-pay

## 2020-05-03 NOTE — Chronic Care Management (AMB) (Signed)
  Chronic Care Management Pharmacy Assistant   Name: [name]  MRN: [id number] DOB: 05/21/1946  Reason for Encounter: Disease State- Hypertension Adherence Call.  Patient Questions:  1.  Have you seen any other providers since your last visit? No   2.  Any changes in your medicines or health? Yes , Nausea and Constipation.   PCP : Sanders, Robyn, MD  Allergies:   Allergies  Allergen Reactions  . Other Anaphylaxis and Rash    Bleach  . Penicillins Hives    Did it involve swelling of the face/tongue/throat, SOB, or low BP? No Did it involve sudden or severe rash/hives, skin peeling, or any reaction on the inside of your mouth or nose? Yes Did you need to seek medical attention at a hospital or doctor's office? Yes When did it last happen?Childhood allergy  If all above answers are "NO", may proceed with cephalosporin use.    . Sulfa Antibiotics Hives  . Aspirin Other (See Comments)     On aspirin 81 mg - Rectal bleeding in dec 2018  . Codeine     Headache and makes the patient feel "off"    Medications: Outpatient Encounter Medications as of 05/03/2020  Medication Sig  . acetaminophen (TYLENOL) 500 MG tablet Take 500-1,000 mg by mouth every 4 (four) hours as needed for moderate pain.   . albuterol (VENTOLIN HFA) 108 (90 Base) MCG/ACT inhaler Inhale 1-2 puffs into the lungs every 6 (six) hours as needed for wheezing or shortness of breath.  . Alcohol Swabs (ALCOHOL PADS) 70 % PADS Use as directed dx: e11.22  . Ascorbic Acid (VITAMIN C WITH ROSE HIPS) 1000 MG tablet Take 1,000 mg by mouth daily.  . aspirin 81 MG chewable tablet Chew 1 tablet (81 mg total) by mouth daily.  . atorvastatin (LIPITOR) 10 MG tablet Take 1 tablet (10 mg total) by mouth every evening.  . B-D ULTRAFINE III SHORT PEN 31G X 8 MM MISC USE DAILY WITH VICTOZA AND/OR NOVOLOG.  . Blood Glucose Calibration (TRUE METRIX LEVEL 1) Low SOLN Use as directed  Dx:e11.22  . Blood Glucose Monitoring  Suppl (TRUE METRIX METER) w/Device KIT Use as directed to check blood sugars 2 times per day dx: e11.22  . carvedilol (COREG) 25 MG tablet TAKE 1 TABLET BY MOUTH TWICE DAILY WITH MEALS  . Cholecalciferol (VITAMIN D-3) 1000 units CAPS Take 5,000 Units by mouth daily.   . dexlansoprazole (DEXILANT) 60 MG capsule Take 1 capsule (60 mg total) by mouth daily.  . diclofenac sodium (VOLTAREN) 1 % GEL Apply 2 g topically 4 (four) times daily as needed (pain).  . ferrous sulfate 324 MG TBEC Take 324 mg by mouth.  . furosemide (LASIX) 80 MG tablet Take 80 mg by mouth daily. 80mg daily Monday-Friday, 40mg on Saturday and Sunday  . glucose blood (TRUE METRIX BLOOD GLUCOSE TEST) test strip Use as directed to check blood sugars 2 times per day dx: e11.22  . hydrALAZINE (APRESOLINE) 25 MG tablet Take 3 tablets (75 mg total) by mouth 3 (three) times daily. (Patient taking differently: Take 75 mg by mouth in the morning and at bedtime. )  . hydrocortisone cream 1 % Apply 1 application topically daily as needed for itching.   . insulin lispro (HUMALOG KWIKPEN) 100 UNIT/ML KwikPen Inject 0.02-0.04 mLs (2-4 Units total) into the skin 3 (three) times daily as needed (high blood sugar over 150).  . Isopropyl Alcohol (ALCOHOL WIPES) 70 % MISC Apply 1 each topically   2 (two) times daily.  . isosorbide mononitrate (IMDUR) 30 MG 24 hr tablet Take 1 tablet (30 mg total) by mouth daily.  . lamoTRIgine (LAMICTAL) 150 MG tablet Take 1 tablet (150 mg total) by mouth 2 (two) times daily.  . levETIRAcetam (KEPPRA) 500 MG tablet TAKE 2 TABLETS AT BEDTIME  . Menthol, Topical Analgesic, (STOPAIN ROLL-ON EX) Apply 1 application topically daily as needed (pain).  . metolazone (ZAROXOLYN) 2.5 MG tablet   . neomycin-bacitracin-polymyxin (NEOSPORIN) ointment Apply 1 application topically as needed for wound care.   . ondansetron (ZOFRAN) 4 MG tablet   . OZEMPIC, 1 MG/DOSE, 4 MG/3ML SOPN INJECT 1MG INTO THE SKIN ONCE A WEEK  .  Pancrelipase, Lip-Prot-Amyl, (ZENPEP) 25000-79000 units CPEP Take 1-2 capsules by mouth See admin instructions. Take 2 capsules in the morning, 1 capsule with lunch, and 2 capsules in the evening.  . potassium chloride SA (KLOR-CON) 20 MEQ tablet TAKE 2 TABLETS EVERY DAY BUT WHEN TAKING METOLAZONE TAKE 3 TABLETS DAILY AS DIRECTED  . sennosides-docusate sodium (SENOKOT-S) 8.6-50 MG tablet Take 1 tablet by mouth daily.  . sertraline (ZOLOFT) 50 MG tablet Take 1 tablet (50 mg total) by mouth daily.  . tolnaftate (TINACTIN) 1 % cream Apply 1 application topically daily as needed (foot fungus).   . TOUJEO SOLOSTAR 300 UNIT/ML Solostar Pen INJECT 36 UNITS INTO THE SKIN AT BEDTIME. (Patient taking differently: 34 Units. )  . TRUEplus Lancets 33G MISC Use as directed to check blood sugars 2 times per day dx: e11.22  . zinc sulfate 220 (50 Zn) MG capsule Take 220 mg by mouth daily.   No facility-administered encounter medications on file as of 05/03/2020.    Current Diagnosis: Patient Active Problem List   Diagnosis Date Noted  . Chronic kidney disease, stage 2 (mild) 01/05/2020  . Hypertensive chronic kidney disease with stage 1 through stage 4 chronic kidney disease, or unspecified chronic kidney disease 01/05/2020  . Snoring 01/05/2020  . Neuroleptic-induced parkinsonism (HCC) 09/28/2019  . Class 3 severe obesity due to excess calories with serious comorbidity and body mass index (BMI) of 40.0 to 44.9 in adult (HCC) 05/22/2019  . Pulmonary hypertension, unspecified (HCC) 04/06/2019  . Syncope 02/24/2019  . Rheumatoid arthritis (HCC) 01/21/2019  . Radial styloid tenosynovitis 11/24/2018  . Type 2 diabetes mellitus with stage 2 chronic kidney disease, with long-term current use of insulin (HCC) 07/21/2018  . Chest pain 05/15/2018  . 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 Excessive daytime sleepiness 01/30/2018  . Sleep-related  hypoventilation 01/30/2018  . Recurrent depression (HCC) 11/06/2017  . OSA on CPAP 10/07/2017  . Todd's paralysis (HCC)   . Chronic diastolic CHF (congestive heart failure), NYHA class 2 (HCC)   . Chronic respiratory failure with hypoxia (HCC)   . Acute lower GI bleeding   . Stroke-like symptoms 06/15/2017  . Rectal bleeding 06/15/2017  . Dehydration 03/07/2017  . Medication management 12/24/2016  . Near syncope 03/09/2016  . Racing heart beat 03/09/2016  . Spinal stenosis at L4-L5 level 10/04/2014  . Parkinsonism (HCC) 06/24/2013  . Lower abdominal pain 10/31/2012  . Type II diabetes mellitus, uncontrolled (HCC) 10/31/2012  . Seizure disorder (HCC) 10/31/2012  . Diverticulosis 10/31/2012  . Anemia associated with acute blood loss 08/14/2012  . Hematochezia 08/14/2012  . Varicose veins of bilateral lower extremities with other complications 03/21/2012  . Leg pain 03/21/2012  . Radicular pain of left lower extremity 03/21/2012  .   Heart murmur 11/21/2011  . Infected pseudocyst of pancreas 10/28/2011  . Chronic pancreatitis ([place]) 10/28/2011  . Hypertensive heart disease with chronic diastolic congestive heart failure ([place]) 10/28/2011  . Dyslipidemia, goal LDL below 100 10/28/2011  . Morbid obesity with BMI of 45.0-49.9, adult ([place]) 10/28/2011  . Pseudoseizure 10/24/2011  . Gastroesophageal reflux 10/23/2011  . History of tobacco abuse 10/23/2011  . Hypoxemia 10/23/2011  . Disturbance in sleep behavior 09/03/2011  . Partial epilepsy with impairment of consciousness ([place]) 09/03/2011  . Tremor 09/03/2011  . Peripheral edema 04/14/2011  . Obesity hypoventilation syndrome ([place]) 10/26/2010  . Atherosclerotic heart disease of native coronary artery with other forms of angina pectoris ([place]) 08/28/2010  . Acute, but ill-defined, cerebrovascular disease 05/04/2008  . Arthropathy 05/04/2008  . Disequilibrium 05/04/2008  . Generalized tonic clonic epilepsy ([place]) 05/04/2008  . Increased  frequency of urination 05/04/2008  . Insomnia, unspecified 05/04/2008  . Major depressive disorder, single episode, unspecified 05/04/2008  . Memory loss 05/04/2008  . Nausea 05/04/2008  . Other malaise and fatigue 05/04/2008  . Swelling of limb 05/04/2008   Reviewed chart prior to disease state call. Spoke with patient regarding BP  Recent Office Vitals: BP Readings from Last 3 Encounters:  03/22/20 118/60  03/09/20 140/78  03/09/20 122/78   Pulse Readings from Last 3 Encounters:  03/22/20 66  03/09/20 69  03/09/20 70    Wt Readings from Last 3 Encounters:  03/22/20 215 lb 9.6 oz (97.8 kg)  03/09/20 219 lb 3.2 oz (99.4 kg)  03/09/20 217 lb 9.6 oz (98.7 kg)     Kidney Function Lab Results  Component Value Date/Time   CREATININE 0.97 03/09/2020 11:35 AM   CREATININE 0.94 01/28/2020 07:42 PM   CREATININE 0.91 10/04/2016 11:44 AM   GFR 86.68 05/15/2016 01:35 PM   GFRNONAA 59 (L) 03/09/2020 11:35 AM   GFRNONAA 65 10/04/2016 11:44 AM   GFRAA >60 03/09/2020 11:35 AM   GFRAA 75 10/04/2016 11:44 AM    BMP Latest Ref Rng & Units 03/09/2020 01/28/2020 12/03/2019  Glucose 70 - 99 mg/dL 83 72 169(H)  BUN 8 - 23 mg/dL _0 Creatinine 0.44 - 1.00 mg/dL 0.97 0.94 1.04(H)  BUN/Creat Ratio 12 - 28 - - -  Sodium 135 - 145 mmol/L 137 138 135  Potassium 3.5 - 5.1 mmol/L 4.0 3.6 3.5  Chloride 98 - 111 mmol/L 99 101 99  CO2 22 - 32 mmol/L _1 Calcium 8.9 - 10.3 mg/dL 9.2 9.2 8.9    . Current antihypertensive regimen:  o Carvedilol 25 mg one tablet twice a day o Furosemide 80 mg daily Monday - Friday 40 mg on Saturday and Sunday o Hydralazine 25 mg - 75 mg in morning and bedtime . How often are you checking your Blood Pressure? Patient states she takes blood pressure once a day  . Current home BP readings: 05/03/2020 - 132/70 /05/02/2020 -152/70 / 05/01/2020 106/48   . What recent interventions/DTPs have been made by any provider to improve Blood Pressure control since  last CPP Visit: Patient states she takes all medication as directed by providers.  . Any recent hospitalizations or ED visits since last visit with CPP? No  . What diet changes have been made to improve Blood Pressure Control?  o Patient states her sister does the cooking ,she eats lots of vegetables ,baked foods - Chicken / no red meats , low salt.  . What exercise is being done to improve your Blood  Pressure Control?  o Patient states she is as active as she can be, because if she walks she has issues with shortness of breath.  Patient states she can do some chair exercises.  Adherence Review: Is the patient currently on ACE/ARB medication? No Does the patient have >5 day gap between last estimated fill dates? NO       Goals Addressed              This Visit's Progress   .  Seizures: "I need to clarify with my doctor about my Seizure meds" (pt-stated)   On track     Reading (see longtitudinal plan of care for additional care plan information)  Current Barriers:  Marland Kitchen Knowledge Deficits related to disease education and support for Seizures . Chronic Disease Management support and education needs related to DM, CHF, Seizures   Nurse Case Manager Clinical Goal(s):  Marland Kitchen Over the next 90 days, patient will work with the [facility] CM  to address needs related to disease education and support to improve Self Health management for Seizure disorder . Over the next 90 days, patient will experience decrease in ED visits. ED visits in last 6 months = 1 ED visit, 1 Admission   CCM RN CM Interventions:  09/08/19 call completed with patient  . Evaluation of current treatment plan related to Seizures and patient's adherence to plan as established by provider. . Provided education to patient re: importance of logging all Seizure activity including any important details such as potential triggers, aura's, time of event, etc. . Reviewed medications with patient and discussed patient received  her [place] via [facility] mail order; discussed indication, frequency and dosage of prescribed medications including Keppra and Lamictal; Determined patient is not comfortable taking Keppra until she clarify's with Neurology . Discussed plans with patient for ongoing care management follow up and provided patient with direct contact information for care management team . Reviewed scheduled/upcoming provider appointments including: Neurology, 09/11/19 _0 :00 PM   Patient Self Care Activities:  . Self administers medications as prescribed . Performs ADL's independently  Initial goal documentation        Follow-Up:  Pharmacist Review - Patient states she has been having some constipation for three weeks . Patient states she has been taking her Zofran, because she has been having some nausea.   [name], CPP Notified  Judithann Sheen, [place] Pharmacist Assistant [phone]

## 2020-05-05 DIAGNOSIS — G4733 Obstructive sleep apnea (adult) (pediatric): Secondary | ICD-10-CM | POA: Diagnosis not present

## 2020-05-06 ENCOUNTER — Telehealth: Payer: Medicare HMO

## 2020-05-06 ENCOUNTER — Ambulatory Visit: Payer: Medicare HMO

## 2020-05-09 DIAGNOSIS — N182 Chronic kidney disease, stage 2 (mild): Secondary | ICD-10-CM | POA: Diagnosis not present

## 2020-05-12 ENCOUNTER — Telehealth: Payer: Medicare HMO

## 2020-05-20 DIAGNOSIS — G4733 Obstructive sleep apnea (adult) (pediatric): Secondary | ICD-10-CM | POA: Diagnosis not present

## 2020-05-20 DIAGNOSIS — I5032 Chronic diastolic (congestive) heart failure: Secondary | ICD-10-CM | POA: Diagnosis not present

## 2020-05-20 DIAGNOSIS — R062 Wheezing: Secondary | ICD-10-CM | POA: Diagnosis not present

## 2020-05-20 DIAGNOSIS — U071 COVID-19: Secondary | ICD-10-CM | POA: Diagnosis not present

## 2020-05-20 DIAGNOSIS — J9621 Acute and chronic respiratory failure with hypoxia: Secondary | ICD-10-CM | POA: Diagnosis not present

## 2020-05-23 ENCOUNTER — Telehealth: Payer: Medicare HMO

## 2020-05-23 ENCOUNTER — Telehealth: Payer: Self-pay

## 2020-05-23 DIAGNOSIS — K625 Hemorrhage of anus and rectum: Secondary | ICD-10-CM | POA: Diagnosis not present

## 2020-05-23 DIAGNOSIS — D509 Iron deficiency anemia, unspecified: Secondary | ICD-10-CM | POA: Diagnosis not present

## 2020-05-23 NOTE — Telephone Encounter (Signed)
°  Chronic Care Management   Outreach Note  05/23/2020 Name: [name] MRN: [id number] DOB: 22-Apr-1946  Referred by: [name], MD Reason for referral : Care Coordination   An unsuccessful telephone outreach was attempted today. The patient was referred to the case management team for assistance with care management and care coordination.   Follow Up Plan: A HIPAA compliant phone message was left for the patient providing contact information and requesting a return call.  The care management team will reach out to the patient again over the next 28 days.   Daneen Schick, BSW, CDP Social Worker, Certified Dementia Practitioner [place] [facility] [place] [facility] [phone]

## 2020-05-24 DIAGNOSIS — U071 COVID-19: Secondary | ICD-10-CM | POA: Diagnosis not present

## 2020-05-30 DIAGNOSIS — I5032 Chronic diastolic (congestive) heart failure: Secondary | ICD-10-CM | POA: Diagnosis not present

## 2020-05-30 DIAGNOSIS — K625 Hemorrhage of anus and rectum: Secondary | ICD-10-CM | POA: Diagnosis not present

## 2020-05-31 ENCOUNTER — Telehealth: Payer: Self-pay

## 2020-05-31 NOTE — Chronic Care Management (AMB) (Signed)
Chronic Care Management Pharmacy Assistant   Name: [name]  MRN: [id number] DOB: 1946/02/22  Reason for Encounter: Disease State - Hypertension and Diabetes Adherence Call   Patient Questions:  1.  Have you seen any other providers since your last visit? No    2.  Any changes in your medicines or health? No     PCP : [name], MD  Allergies:   Allergies  Allergen Reactions  . Other Anaphylaxis and Rash    Bleach  . Penicillins Hives    Did it involve swelling of the face/tongue/throat, SOB, or low BP? No Did it involve sudden or severe rash/hives, skin peeling, or any reaction on the inside of your mouth or nose? Yes Did you need to seek medical attention at a hospital or doctor's office? Yes When did it last happen?Childhood allergy  If all above answers are "NO", may proceed with cephalosporin use.    . Sulfa Antibiotics Hives  . Aspirin Other (See Comments)     On aspirin 81 mg - Rectal bleeding in dec 2018  . Codeine     Headache and makes the patient feel "off"    Medications: Outpatient Encounter Medications as of 05/31/2020  Medication Sig  . acetaminophen (TYLENOL) 500 MG tablet Take 500-1,000 mg by mouth every 4 (four) hours as needed for moderate pain.   Marland Kitchen albuterol (VENTOLIN HFA) 108 (90 Base) MCG/ACT inhaler Inhale 1-2 puffs into the lungs every 6 (six) hours as needed for wheezing or shortness of breath.  . Alcohol Swabs (ALCOHOL PADS) 70 % PADS Use as directed dx: e11.22  . Ascorbic Acid (VITAMIN C WITH ROSE HIPS) 1000 MG tablet Take 1,000 mg by mouth daily.  Marland Kitchen atorvastatin (LIPITOR) 10 MG tablet Take 1 tablet (10 mg total) by mouth every evening.  . B-D ULTRAFINE III SHORT PEN 31G X 8 MM MISC USE DAILY WITH VICTOZA AND/OR NOVOLOG.  . Blood Glucose Calibration (TRUE METRIX LEVEL 1) Low SOLN Use as directed  Dx:e11.22  . Blood Glucose Monitoring Suppl (TRUE METRIX METER) w/Device KIT Use as directed to check blood sugars 2 times per  day dx: e11.22  . Cholecalciferol (VITAMIN D-3) 1000 units CAPS Take 5,000 Units by mouth daily.   . diclofenac sodium (VOLTAREN) 1 % GEL Apply 2 g topically 4 (four) times daily as needed (pain).  . ferrous sulfate 324 MG TBEC Take 324 mg by mouth.  . furosemide (LASIX) 80 MG tablet Take 80 mg by mouth daily. 71m daily Monday-Friday, 47mon Saturday and Sunday  . glucose blood (TRUE METRIX BLOOD GLUCOSE TEST) test strip Use as directed to check blood sugars 2 times per day dx: e11.22  . hydrALAZINE (APRESOLINE) 25 MG tablet Take 3 tablets (75 mg total) by mouth 3 (three) times daily.  . hydrocortisone cream 1 % Apply 1 application topically daily as needed for itching.   . insulin lispro (HUMALOG KWIKPEN) 100 UNIT/ML KwikPen Inject 0.02-0.04 mLs (2-4 Units total) into the skin 3 (three) times daily as needed (high blood sugar over 150).  . Isopropyl Alcohol (ALCOHOL WIPES) 70 % MISC Apply 1 each topically 2 (two) times daily.  . isosorbide mononitrate (IMDUR) 30 MG 24 hr tablet Take 1 tablet (30 mg total) by mouth daily.  . Marland KitchenamoTRIgine (LAMICTAL) 150 MG tablet Take 1 tablet (150 mg total) by mouth 2 (two) times daily.  . Menthol, Topical Analgesic, (STOPAIN ROLL-ON EX) Apply 1 application topically daily as needed (pain).  .Marland Kitchen  metolazone (ZAROXOLYN) 2.5 MG tablet   . neomycin-bacitracin-polymyxin (NEOSPORIN) ointment Apply 1 application topically as needed for wound care.   . ondansetron (ZOFRAN) 4 MG tablet   . OZEMPIC, 1 MG/DOSE, 4 MG/3ML SOPN INJECT 1MG INTO THE SKIN ONCE A WEEK  . Pancrelipase, Lip-Prot-Amyl, 25000-79000 units CPEP Take 1-2 capsules by mouth See admin instructions. Take 2 capsules in the morning, 1 capsule with lunch, and 2 capsules in the evening.  . potassium chloride SA (KLOR-CON) 20 MEQ tablet TAKE 2 TABLETS EVERY DAY BUT WHEN TAKING METOLAZONE TAKE 3 TABLETS DAILY AS DIRECTED  . sennosides-docusate sodium (SENOKOT-S) 8.6-50 MG tablet Take 1 tablet by mouth daily.  .  sertraline (ZOLOFT) 50 MG tablet Take 1 tablet (50 mg total) by mouth daily.  Marland Kitchen tolnaftate (TINACTIN) 1 % cream Apply 1 application topically daily as needed (foot fungus).   [name] SOLOSTAR 300 UNIT/ML Solostar Pen INJECT 36 UNITS INTO THE SKIN AT BEDTIME. (Patient taking differently: 34 Units.)  . TRUEplus Lancets 33G MISC Use as directed to check blood sugars 2 times per day dx: e11.22  . zinc sulfate 220 (50 Zn) MG capsule Take 220 mg by mouth daily.  . [DISCONTINUED] aspirin 81 MG chewable tablet Chew 1 tablet (81 mg total) by mouth daily. (Patient not taking: Reported on 06/08/2020)  . [DISCONTINUED] carvedilol (COREG) 25 MG tablet TAKE 1 TABLET BY MOUTH TWICE DAILY WITH MEALS  . [DISCONTINUED] dexlansoprazole (DEXILANT) 60 MG capsule Take 1 capsule (60 mg total) by mouth daily.  . [DISCONTINUED] levETIRAcetam (KEPPRA) 500 MG tablet TAKE 2 TABLETS AT BEDTIME   No facility-administered encounter medications on file as of 05/31/2020.    Current Diagnosis: Patient Active Problem List   Diagnosis Date Noted  . Postinflammatory pulmonary fibrosis ([place]) 06/08/2020  . Lung nodule 06/08/2020  . Chronic kidney disease, stage 2 (mild) 01/05/2020  . Hypertensive chronic kidney disease with stage 1 through stage 4 chronic kidney disease, or unspecified chronic kidney disease 01/05/2020  . Snoring 01/05/2020  . Neuroleptic-induced parkinsonism ([place]) 09/28/2019  . Class 3 severe obesity due to excess calories with serious comorbidity and body mass index (BMI) of 40.0 to 44.9 in adult [facility]) 05/22/2019  . Pulmonary hypertension, unspecified ([place]) 04/06/2019  . Syncope 02/24/2019  . Rheumatoid arthritis ([place]) 01/21/2019  . Radial styloid tenosynovitis 11/24/2018  . Type 2 diabetes mellitus with stage 2 chronic kidney disease, with long-term current use of insulin ([place]) 07/21/2018  . Chest pain 05/15/2018  . 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 Excessive daytime sleepiness 01/30/2018  . Sleep-related hypoventilation 01/30/2018  . Recurrent depression ([place]) 11/06/2017  . OSA on CPAP 10/07/2017  . Todd's paralysis ([place])   . Chronic diastolic CHF (congestive heart failure), NYHA class 2 ([place])   . Chronic respiratory failure with hypoxia ([place])   . Acute lower GI bleeding   . Stroke-like symptoms 06/15/2017  . Rectal bleeding 06/15/2017  . Dehydration 03/07/2017  . Medication management 12/24/2016  . Near syncope 03/09/2016  . Racing heart beat 03/09/2016  . Dyspnea 01/28/2016  . Spinal stenosis at L4-L5 level 10/04/2014  . Parkinsonism ([place]) 06/24/2013  . Lower abdominal pain 10/31/2012  . Type II diabetes mellitus, uncontrolled ([place]) 10/31/2012  . Seizure disorder ([place]) 10/31/2012  . Diverticulosis 10/31/2012  . Anemia associated with acute blood loss 08/14/2012  . Hematochezia 08/14/2012  . Varicose veins of bilateral lower extremities with other complications 92/06/69  . Leg pain  03/21/2012  . Radicular pain of left lower extremity 03/21/2012  . Heart murmur 11/21/2011  . Infected pseudocyst of pancreas 10/28/2011  . Chronic pancreatitis ([place]) 10/28/2011  . Hypertensive heart disease with chronic diastolic congestive heart failure ([place]) 10/28/2011  . Dyslipidemia, goal LDL below 100 10/28/2011  . Morbid obesity with BMI of 45.0-49.9, adult ([place]) 10/28/2011  . Pseudoseizure 10/24/2011  . Gastroesophageal reflux 10/23/2011  . History of tobacco abuse 10/23/2011  . Hypoxemia 10/23/2011  . Disturbance in sleep behavior 09/03/2011  . Partial epilepsy with impairment of consciousness ([place]) 09/03/2011  . Tremor 09/03/2011  . Peripheral edema 04/14/2011  . Obesity hypoventilation syndrome ([place]) 10/26/2010  . Atherosclerotic heart disease of native coronary artery with other forms of angina pectoris ([place]) 08/28/2010  . Acute, but ill-defined, cerebrovascular disease 05/04/2008  . Arthropathy 05/04/2008  .  Disequilibrium 05/04/2008  . Generalized tonic clonic epilepsy ([place]) 05/04/2008  . Increased frequency of urination 05/04/2008  . Insomnia, unspecified 05/04/2008  . Major depressive disorder, single episode, unspecified 05/04/2008  . Memory loss 05/04/2008  . Nausea 05/04/2008  . Other malaise and fatigue 05/04/2008  . Swelling of limb 05/04/2008   Reviewed chart prior to disease state call. Spoke with patient regarding BP  Recent Office Vitals: BP Readings from Last 3 Encounters:  06/08/20 138/64  06/06/20 114/66  03/22/20 118/60   Pulse Readings from Last 3 Encounters:  06/08/20 61  06/06/20 63  03/22/20 66    Wt Readings from Last 3 Encounters:  06/08/20 205 lb (93 kg)  06/06/20 204 lb 6.4 oz (92.7 kg)  03/22/20 215 lb 9.6 oz (97.8 kg)     Kidney Function Lab Results  Component Value Date/Time   CREATININE 0.89 06/06/2020 04:00 PM   CREATININE 0.97 03/09/2020 11:35 AM   CREATININE 0.91 10/04/2016 11:44 AM   GFR 86.68 05/15/2016 01:35 PM   GFRNONAA 65 06/06/2020 04:00 PM   GFRNONAA 65 10/04/2016 11:44 AM   GFRAA 75 06/06/2020 04:00 PM   GFRAA 75 10/04/2016 11:44 AM    BMP Latest Ref Rng & Units 06/06/2020 03/09/2020 01/28/2020  Glucose 65 - 99 mg/dL 173(H) 83 72  BUN 8 - 27 mg/dL 11 13 14   Creatinine 0.57 - 1.00 mg/dL 0.89 0.97 0.94  BUN/Creat Ratio 12 - 28 12 - -  Sodium 134 - 144 mmol/L 136 137 138  Potassium 3.5 - 5.2 mmol/L 3.7 4.0 3.6  Chloride 96 - 106 mmol/L 96 99 101  CO2 20 - 29 mmol/L 26 29 26   Calcium 8.7 - 10.3 mg/dL 9.3 9.2 9.2    . Current antihypertensive regimen:  o Carvedilol 25 mg twice daily o Furosemide 80 mg daily  o Hydralazine 25 mg 3 tablets twice daily  . How often are you checking your Blood Pressure? Patient stated checks her blood pressure daily.  . Current home BP readings: 06/09/20 141/62  06/08/20 146/65 06/07/20 129/60 06/06/20 132/52 06/05/20 140/71  . What recent interventions/DTPs have been made by any provider to  improve Blood Pressure control since last CPP Visit:  Patient is taking her medications as directed by provider.  . Any recent hospitalizations or ED visits since last visit with CPP? No  . What diet changes have been made to improve Blood Pressure Control?  o Patient stated she is watching her salts, starches, and fats. She is eating baked foods and no fried foods.  . What exercise is being done to improve your Blood Pressure Control?  o Patient stated she is  active around the house.  Adherence Review: Is the patient currently on ACE/ARB medication? No Does the patient have >5 day gap between last estimated fill dates? No     Recent Relevant Labs: Lab Results  Component Value Date/Time   HGBA1C 6.5 (H) 06/06/2020 04:00 PM   HGBA1C 7.6 (H) 11/09/2019 12:21 AM   MICROALBUR 10 06/06/2020 01:23 PM   MICROALBUR 10 01/06/2020 12:16 PM    Kidney Function Lab Results  Component Value Date/Time   CREATININE 0.89 06/06/2020 04:00 PM   CREATININE 0.97 03/09/2020 11:35 AM   CREATININE 0.91 10/04/2016 11:44 AM   GFR 86.68 05/15/2016 01:35 PM   GFRNONAA 65 06/06/2020 04:00 PM   GFRNONAA 65 10/04/2016 11:44 AM   GFRAA 75 06/06/2020 04:00 PM   GFRAA 75 10/04/2016 11:44 AM    . Current antihyperglycemic regimen:  Humalog Kwikpen 2-4 units three times daily as needed Ozempic 1 mg weekly Toujeo 300 units/mL 34 units at bedtime  . What recent interventions/DTPs have been made to improve glycemic control:  o Patient stated she is taking her medications for her diabetes. Patient is taking her Toujeo different she is currently taking 25 units per remote health.  . Have there been any recent hospitalizations or ED visits since last visit with CPP? No.  . Patient reports hypoglycemic symptoms, including Vision changes . Patient reports hyperglycemic symptoms, including excessive thirst and fatigue . How often are you checking your blood sugar? 3-4 times daily . What are your blood sugars  ranging?  o Fasting: 06/09/20 145 o Fasting: 06/08/20 112 o Fasting: 06/07/20 76 o Before meals: None o After meals: 06/08/20 130 o After Meals : 06/07/20 129 o Bedtime: 06/08/20 137 o Bedtime: 06/07/20 99  . During the week, how often does your blood glucose drop below 70? Yes. Patient stated she had one episode on 12/5 59. Patient stated she was thirsty and had some vision issues.  . Are you checking your feet daily/regularly? Yes,Patient denies any open sores, no tingling or numbness. Patient stated she does have pain in her feet.  Adherence Review: Is the patient currently on a STATIN medication? Yes Is the patient currently on ACE/ARB medication? No Does the patient have >5 day gap between last estimated fill dates? No        Goals Addressed            This Visit's Progress   . Pharmacy Care Plan   On track    CARE PLAN ENTRY (see longitudinal plan of care for additional care plan information)  Current Barriers:  . Chronic Disease Management support, education, and care coordination needs related to Hypertension, Hyperlipidemia, Diabetes, and Heart Failure   Hypertension BP Readings from Last 3 Encounters:  03/09/20 140/78  03/09/20 122/78  02/23/20 116/65   . Pharmacist Clinical Goal(s): o Over the next 90 days, patient will work with PharmD and providers to maintain BP goal <130/80 . Current regimen:  o Carvedilol 59m twice daily o Furosemide 80 mg daily  o Hydralazine 278m3 tablets twice daily . Interventions: o Patient mentioned medication changes - Addition of metolazone (patient unsure of strength) - Now taking hydralazine 7517mhree times daily . Advised patient to make sure she has Hydralazine 56m72mblets instead of Hydralazine 25mg81mlets (patient mentioned taking 1 and 1/2 tablets three times daily, but [facility] and she would need to take 3 tablets at each dose to make 75mg) 62mrify  dosing of  medications with patient and Cardiologist at follow up o Advised patient to take medications consistently at the same time each day o Discussed appropriate goals for blood pressure less than 130/80 . Patient self care activities - Over the next 90 days, patient will: o Check BP daily, document, and provide at future appointments o Ensure daily salt intake < 2300 mg/day o Verify strength of hydralazine at home - If 40m- need to take 1 and 1/2 tablets for each dose to make 725m- If 2515mneed to take 3 tablets for each dose to make 99m10myperlipidemia Lab Results  Component Value Date/Time   LDLCALC 65 12/17/2019 11:46 AM   LDLCALC 65 10/22/2018 02:46 PM   LDLDIRECT 66.0 01/27/2015 11:04 AM   . Pharmacist Clinical Goal(s): o Over the next 90 days, patient will work with PharmD and providers to maintain LDL goal < 70 . Current regimen:  o Atorvastatin 10mg31mly . Interventions: o Dietary recommendations provided . Patient self care activities - Over the next 90 days, patient will: o Take cholesterol medication daily as directed  Diabetes Lab Results  Component Value Date/Time   HGBA1C 7.6 (H) 11/09/2019 12:21 AM   HGBA1C 8.0 (H) 08/24/2019 03:30 PM   . Pharmacist Clinical Goal(s): o Over the next 90 days, patient will work with PharmD and providers to achieve A1c goal <7% . Current regimen:  o Humalog Kwikpen 2-4 units three times daily as needed o Ozempic 1mg w47mly o Toujeo 300 units/mL 34 units at bedtime . Interventions: o Dietary recommendations provided o Discussed hypoglycemia and appropriate treatment . Patient self care activities - Over the next 90 days, patient will: o Check blood sugar 3-4 times daily, document, and provide at future appointments o Contact provider with any episodes of hypoglycemia  Medication management . Pharmacist Clinical Goal(s): o Over the next 90 days, patient will work with PharmD and providers to maintain optimal medication  adherence . [facility] o Comprehensive medication review performed. o Medication list updated o Continue current medication management strategy . Patient self care activities - Over the next 90 days, patient will: o Focus on medication adherence by using pill box o Take medications as prescribed o Report any questions or concerns to PharmD and/or provider(s)  Please see past updates related to this goal by clicking on the "Past Updates" button in the selected goal         Follow-Up:  Pharmacist Review   VallieOrlando PennerNotified  VeroniJudithann Sheen CExeteracist Assistant [phone]

## 2020-06-01 DIAGNOSIS — K5900 Constipation, unspecified: Secondary | ICD-10-CM | POA: Diagnosis not present

## 2020-06-01 DIAGNOSIS — D649 Anemia, unspecified: Secondary | ICD-10-CM | POA: Diagnosis not present

## 2020-06-01 DIAGNOSIS — K921 Melena: Secondary | ICD-10-CM | POA: Diagnosis not present

## 2020-06-03 ENCOUNTER — Other Ambulatory Visit: Payer: Self-pay

## 2020-06-03 MED ORDER — CARVEDILOL 25 MG PO TABS: 25.0000 mg | ORAL_TABLET | Freq: Two times a day (BID) | ORAL | 0 refills | Status: DC

## 2020-06-06 ENCOUNTER — Ambulatory Visit (INDEPENDENT_AMBULATORY_CARE_PROVIDER_SITE_OTHER): Payer: Medicare HMO | Admitting: Internal Medicine

## 2020-06-06 ENCOUNTER — Other Ambulatory Visit: Payer: Self-pay

## 2020-06-06 ENCOUNTER — Encounter: Payer: Self-pay | Admitting: Internal Medicine

## 2020-06-06 VITALS — BP 114/66 | HR 63 | Temp 98.3°F | Ht 59.2 in | Wt 204.4 lb

## 2020-06-06 DIAGNOSIS — E1122 Type 2 diabetes mellitus with diabetic chronic kidney disease: Secondary | ICD-10-CM | POA: Diagnosis not present

## 2020-06-06 DIAGNOSIS — I130 Hypertensive heart and chronic kidney disease with heart failure and stage 1 through stage 4 chronic kidney disease, or unspecified chronic kidney disease: Secondary | ICD-10-CM

## 2020-06-06 DIAGNOSIS — Z79899 Other long term (current) drug therapy: Secondary | ICD-10-CM | POA: Diagnosis not present

## 2020-06-06 DIAGNOSIS — Z6841 Body Mass Index (BMI) 40.0 and over, adult: Secondary | ICD-10-CM

## 2020-06-06 DIAGNOSIS — F339 Major depressive disorder, recurrent, unspecified: Secondary | ICD-10-CM | POA: Diagnosis not present

## 2020-06-06 DIAGNOSIS — N182 Chronic kidney disease, stage 2 (mild): Secondary | ICD-10-CM | POA: Diagnosis not present

## 2020-06-06 DIAGNOSIS — Z794 Long term (current) use of insulin: Secondary | ICD-10-CM | POA: Diagnosis not present

## 2020-06-06 DIAGNOSIS — Z23 Encounter for immunization: Secondary | ICD-10-CM

## 2020-06-06 DIAGNOSIS — Z0000 Encounter for general adult medical examination without abnormal findings: Secondary | ICD-10-CM

## 2020-06-06 DIAGNOSIS — D508 Other iron deficiency anemias: Secondary | ICD-10-CM

## 2020-06-06 DIAGNOSIS — J449 Chronic obstructive pulmonary disease, unspecified: Secondary | ICD-10-CM | POA: Diagnosis not present

## 2020-06-06 LAB — POCT URINALYSIS DIPSTICK
Bilirubin, UA: NEGATIVE
Blood, UA: NEGATIVE
Glucose, UA: NEGATIVE
Ketones, UA: NEGATIVE
Leukocytes, UA: NEGATIVE
Nitrite, UA: NEGATIVE
Protein, UA: NEGATIVE
Spec Grav, UA: 1.025 (ref 1.010–1.025)
Urobilinogen, UA: 0.2 E.U./dL
pH, UA: 6 (ref 5.0–8.0)

## 2020-06-06 LAB — POCT UA - MICROALBUMIN
Albumin/Creatinine Ratio, Urine, POC: <30
Creatinine, POC: 200 mg/dL
Microalbumin Ur, POC: 10 mg/L

## 2020-06-06 NOTE — Patient Instructions (Signed)
Health Maintenance, Female Adopting a healthy lifestyle and getting preventive care are important in promoting health and wellness. Ask your health care provider about:  The right schedule for you to have regular tests and exams.  Things you can do on your own to prevent diseases and keep yourself healthy. What should I know about diet, weight, and exercise? Eat a healthy diet   Eat a diet that includes plenty of vegetables, fruits, low-fat dairy products, and lean protein.  Do not eat a lot of foods that are high in solid fats, added sugars, or sodium. Maintain a healthy weight Body mass index (BMI) is used to identify weight problems. It estimates body fat based on height and weight. Your health care provider can help determine your BMI and help you achieve or maintain a healthy weight. Get regular exercise Get regular exercise. This is one of the most important things you can do for your health. Most adults should:  Exercise for at least 150 minutes each week. The exercise should increase your heart rate and make you sweat (moderate-intensity exercise).  Do strengthening exercises at least twice a week. This is in addition to the moderate-intensity exercise.  Spend less time sitting. Even light physical activity can be beneficial. Watch cholesterol and blood lipids Have your blood tested for lipids and cholesterol at 72 years of age, then have this test every 5 years. Have your cholesterol levels checked more often if:  Your lipid or cholesterol levels are high.  You are older than 72 years of age.  You are at high risk for heart disease. What should I know about cancer screening? Depending on your health history and family history, you may need to have cancer screening at various ages. This may include screening for:  Breast cancer.  Cervical cancer.  Colorectal cancer.  Skin cancer.  Lung cancer. What should I know about heart disease, diabetes, and high blood  pressure? Blood pressure and heart disease  High blood pressure causes heart disease and increases the risk of stroke. This is more likely to develop in people who have high blood pressure readings, are of African descent, or are overweight.  Have your blood pressure checked: ? Every 3-5 years if you are 18-39 years of age. ? Every year if you are 40 years old or older. Diabetes Have regular diabetes screenings. This checks your fasting blood sugar level. Have the screening done:  Once every three years after age 40 if you are at a normal weight and have a low risk for diabetes.  More often and at a younger age if you are overweight or have a high risk for diabetes. What should I know about preventing infection? Hepatitis B If you have a higher risk for hepatitis B, you should be screened for this virus. Talk with your health care provider to find out if you are at risk for hepatitis B infection. Hepatitis C Testing is recommended for:  Everyone born from 1945 through 1965.  Anyone with known risk factors for hepatitis C. Sexually transmitted infections (STIs)  Get screened for STIs, including gonorrhea and chlamydia, if: ? You are sexually active and are younger than 72 years of age. ? You are older than 72 years of age and your health care provider tells you that you are at risk for this type of infection. ? Your sexual activity has changed since you were last screened, and you are at increased risk for chlamydia or gonorrhea. Ask your health care provider if   you are at risk.  Ask your health care provider about whether you are at high risk for HIV. Your health care provider may recommend a prescription medicine to help prevent HIV infection. If you choose to take medicine to prevent HIV, you should first get tested for HIV. You should then be tested every 3 months for as long as you are taking the medicine. Pregnancy  If you are about to stop having your period (premenopausal) and  you may become pregnant, seek counseling before you get pregnant.  Take 400 to 800 micrograms (mcg) of folic acid every day if you become pregnant.  Ask for birth control (contraception) if you want to prevent pregnancy. Osteoporosis and menopause Osteoporosis is a disease in which the bones lose minerals and strength with aging. This can result in bone fractures. If you are 65 years old or older, or if you are at risk for osteoporosis and fractures, ask your health care provider if you should:  Be screened for bone loss.  Take a calcium or vitamin D supplement to lower your risk of fractures.  Be given hormone replacement therapy (HRT) to treat symptoms of menopause. Follow these instructions at home: Lifestyle  Do not use any products that contain nicotine or tobacco, such as cigarettes, e-cigarettes, and chewing tobacco. If you need help quitting, ask your health care provider.  Do not use street drugs.  Do not share needles.  Ask your health care provider for help if you need support or information about quitting drugs. Alcohol use  Do not drink alcohol if: ? Your health care provider tells you not to drink. ? You are pregnant, may be pregnant, or are planning to become pregnant.  If you drink alcohol: ? Limit how much you use to 0-1 drink a day. ? Limit intake if you are breastfeeding.  Be aware of how much alcohol is in your drink. In the [place] one drink equals one 12 oz bottle of beer (355 mL), one 5 oz glass of wine (148 mL), or one 1 oz glass of hard liquor (44 mL). General instructions  Schedule regular health, dental, and eye exams.  Stay current with your vaccines.  Tell your health care provider if: ? You often feel depressed. ? You have ever been abused or do not feel safe at home. Summary  Adopting a healthy lifestyle and getting preventive care are important in promoting health and wellness.  Follow your health care provider's instructions about healthy  diet, exercising, and getting tested or screened for diseases.  Follow your health care provider's instructions on monitoring your cholesterol and blood pressure. This information is not intended to replace advice given to you by your health care provider. Make sure you discuss any questions you have with your health care provider. Document Revised: 06/04/2018 Document Reviewed: 06/04/2018 Elsevier Patient Education  2020 [employer]  

## 2020-06-06 NOTE — Progress Notes (Signed)
I,Katawbba Wiggins,acting as a Education administrator for [name], MD.,have documented all relevant documentation on the behalf of [name], MD,as directed by  [name], MD while in the presence of [name], MD.  This visit occurred during the SARS-CoV-2 public health emergency.  Safety protocols were in place, including screening questions prior to the visit, additional usage of staff PPE, and extensive cleaning of exam room while observing appropriate contact time as indicated for disinfecting solutions.  Subjective:     Patient ID: [name] , female    DOB: [birth year] , 74 y.o.   MRN: [id number]   Chief Complaint  Patient presents with  . Annual Exam  . Hypertension  . Diabetes    HPI  The patient is here today for a physical examination. She is alone today, her sister is not present. She reports feeling well. States her daughter in law brought her to appointment. She reports her relationship is getting better with her son/DIL. She feels well today. Denies headaches, chest pain and shortness of breath.   Diabetes She presents for her follow-up diabetic visit. She has type 2 diabetes mellitus. Her disease course has been improving. There are no hypoglycemic associated symptoms. Pertinent negatives for diabetes include no blurred vision and no chest pain. There are no hypoglycemic complications. Diabetic complications include nephropathy. Risk factors for coronary artery disease include diabetes mellitus, dyslipidemia, hypertension, obesity, post-menopausal and sedentary lifestyle. She is compliant with treatment some of the time. Her weight is fluctuating minimally. She is following a generally healthy diet. She never participates in exercise. Her home blood glucose trend is decreasing steadily. Her breakfast blood glucose is taken between 8-9 am. Her breakfast blood glucose range is generally 130-140 mg/dl. An ACE inhibitor/angiotensin II receptor blocker is being taken.  Eye exam is not current.  Hypertension This is a chronic problem. The current episode started more than 1 year ago. The problem has been gradually improving since onset. The problem is controlled. Pertinent negatives include no blurred vision, chest pain, palpitations or shortness of breath. The current treatment provides moderate improvement. Hypertensive end-organ damage includes kidney disease. Identifiable causes of hypertension include sleep apnea.     Past Medical History:  Diagnosis Date  . Abnormal liver function     in the past.  . Anemia   . Arthritis    all over  . Bruises easily   . Cataract    right eye;immature  . CHF (congestive heart failure) ([place])   . Chronic back pain    stenosis  . Chronic cough   . Chronic kidney disease   . COPD (chronic obstructive pulmonary disease) ([place])   . COVID    aug/sept 2021  . Demand myocardial infarction [facility]) 2012   Demand Infarction in setting of Pancreatitis --> mild Troponin elevation, NON-OBSTRUCTIVE CAD  . Depression    takes Abilify daily as well as Zoloft  . Diastolic heart failure 1287   Grade 1 diastolic Dysfunction by Echo   . Diverticulosis   . DM (diabetes mellitus) ([place])    takes Victoza daily as well as Lantus and Humalog  . Empty sella ([place])    on MRI in 2009.  . Glaucoma   . Headache(784.0)    last migraine-4-39yrs ago  . History of blood transfusion    no abnormal reaction noted  . History of colon polyps    benign  . HTN (hypertension)    takes [employer] daily  . Hyperlipidemia  takes Lipitor daily  . Joint swelling   . Nocturia   . Obesity hypoventilation syndrome ([place])   . Obstructive sleep apnea 02/2018   Notably improved split-night study with weight loss from 270 (2017) down to 250 pounds (2019).  . Pancreatitis    takes Pancrelipase daily  . Parkinson's disease ([place])    takes Sinemet daily  . Peripheral neuropathy   . Pneumonia 2012  . Seizures ([place])    takes Lamictal  daily and Primidone nightly;last seizure 2wks ago  . Urinary urgency    With increased frequency  . Varicose veins of both lower extremities with pain    With edema.  Takes daily Lasix     Family History  Problem Relation Age of Onset  . Allergies Father   . Heart disease Father        before 15  . Heart failure Father   . Hypertension Father   . Hyperlipidemia Father   . Heart disease Mother   . Diabetes Mother   . Hypertension Mother   . Hyperlipidemia Mother   . Hypertension Sister   . Heart disease Sister        before 20  . Hyperlipidemia Sister   . Diabetes Brother   . Hypertension Brother   . Diabetes Sister   . Hypertension Sister   . Diabetes Son   . Hypertension Son   . Cancer Other      Current Outpatient Medications:  .  acetaminophen (TYLENOL) 500 MG tablet, Take 500-1,000 mg by mouth every 4 (four) hours as needed for moderate pain. , Disp: , Rfl:  .  albuterol (VENTOLIN HFA) 108 (90 Base) MCG/ACT inhaler, Inhale 1-2 puffs into the lungs every 6 (six) hours as needed for wheezing or shortness of breath., Disp: , Rfl:  .  Alcohol Swabs (ALCOHOL PADS) 70 % PADS, Use as directed dx: e11.22, Disp: 300 each, Rfl: 2 .  Ascorbic Acid (VITAMIN C WITH ROSE HIPS) 1000 MG tablet, Take 1,000 mg by mouth daily., Disp: , Rfl:  .  carvedilol (COREG) 25 MG tablet, Take 1 tablet (25 mg total) by mouth 2 (two) times daily with a meal., Disp: 180 tablet, Rfl: 0 .  Cholecalciferol (VITAMIN D-3) 1000 units CAPS, Take 5,000 Units by mouth daily. , Disp: , Rfl:  .  diclofenac sodium (VOLTAREN) 1 % GEL, Apply 2 g topically 4 (four) times daily as needed (pain)., Disp: , Rfl:  .  ferrous sulfate 324 MG TBEC, Take 324 mg by mouth 2 (two) times daily., Disp: , Rfl:  .  furosemide (LASIX) 80 MG tablet, Take 80 mg by mouth daily. 80mg  daily Monday-Friday, 40mg  on Saturday and Sunday, Disp: , Rfl:  .  hydrocortisone cream 1 %, Apply 1 application topically daily as needed for itching. ,  Disp: , Rfl:  .  insulin lispro (HUMALOG KWIKPEN) 100 UNIT/ML KwikPen, Inject 0.02-0.04 mLs (2-4 Units total) into the skin 3 (three) times daily as needed (high blood sugar over 150)., Disp: 15 mL, Rfl: 2 .  Isopropyl Alcohol (ALCOHOL WIPES) 70 % MISC, Apply 1 each topically 2 (two) times daily., Disp: 300 each, Rfl: 3 .  isosorbide mononitrate (IMDUR) 30 MG 24 hr tablet, Take 1 tablet (30 mg total) by mouth daily., Disp: 30 tablet, Rfl: 11 .  lamoTRIgine (LAMICTAL) 150 MG tablet, Take 1 tablet (150 mg total) by mouth 2 (two) times daily., Disp: 180 tablet, Rfl: 3 .  Menthol, Topical Analgesic, (STOPAIN ROLL-ON EX), Apply 1  application topically daily as needed (pain)., Disp: , Rfl:  .  neomycin-bacitracin-polymyxin (NEOSPORIN) ointment, Apply 1 application topically as needed for wound care. , Disp: , Rfl:  .  ondansetron (ZOFRAN) 4 MG tablet, , Disp: , Rfl:  .  OZEMPIC, 1 MG/DOSE, 4 MG/3ML SOPN, INJECT $RemoveBefo'1MG'butUTTSvNKV$  INTO THE SKIN ONCE A WEEK, Disp: 9 mL, Rfl: 2 .  Pancrelipase, Lip-Prot-Amyl, 25000-79000 units CPEP, Take 1-2 capsules by mouth See admin instructions. Take 2 capsules in the morning, 1 capsule with lunch, and 2 capsules in the evening., Disp: , Rfl:  .  potassium chloride SA (KLOR-CON) 20 MEQ tablet, TAKE 2 TABLETS EVERY DAY BUT WHEN TAKING METOLAZONE TAKE 3 TABLETS DAILY AS DIRECTED, Disp: 204 tablet, Rfl: 2 .  sennosides-docusate sodium (SENOKOT-S) 8.6-50 MG tablet, Take 1 tablet by mouth daily., Disp: , Rfl:  .  sertraline (ZOLOFT) 50 MG tablet, Take 1 tablet (50 mg total) by mouth daily., Disp: 90 tablet, Rfl: 2 .  tolnaftate (TINACTIN) 1 % cream, Apply 1 application topically daily as needed (foot fungus). , Disp: , Rfl:  .  TRUEplus Lancets 33G MISC, Use as directed to check blood sugars 2 times per day dx: e11.22, Disp: 300 each, Rfl: 2 .  zinc sulfate 220 (50 Zn) MG capsule, Take 220 mg by mouth daily., Disp: , Rfl:  .  atorvastatin (LIPITOR) 10 MG tablet, TAKE 1 TABLET EVERY EVENING.,  Disp: 90 tablet, Rfl: 2 .  B-D ULTRAFINE III SHORT PEN 31G X 8 MM MISC, USE DAILY WITH VICTOZA AND/OR NOVOLOG., Disp: 400 each, Rfl: 1 .  Blood Glucose Calibration (TRUE METRIX LEVEL 1) Low SOLN, Use as directed  Dx:e11.22, Disp: 1 each, Rfl: 2 .  Blood Glucose Monitoring Suppl (TRUE METRIX METER) w/Device KIT, Use as directed to check blood sugars 2 times per day dx: e11.22, Disp: 1 kit, Rfl: 1 .  dexlansoprazole (DEXILANT) 60 MG capsule, Take 1 capsule (60 mg total) by mouth daily., Disp: 90 capsule, Rfl: 1 .  empagliflozin (JARDIANCE) 10 MG TABS tablet, Take 1 tablet (10 mg total) by mouth daily before breakfast., Disp: 30 tablet, Rfl: 3 .  glucose blood (TRUE METRIX BLOOD GLUCOSE TEST) test strip, Use as directed to check blood sugars 2 times per day dx: e11.22, Disp: 300 each, Rfl: 2 .  hydrALAZINE (APRESOLINE) 25 MG tablet, Take 100 mg by mouth 3 (three) times daily., Disp: , Rfl:  .  insulin glargine, 2 Unit Dial, (TOUJEO MAX SOLOSTAR) 300 UNIT/ML Solostar Pen, Inject 25 Units into the skin., Disp: , Rfl:  .  levETIRAcetam (KEPPRA) 500 MG tablet, TAKE 2 TABLETS AT BEDTIME, Disp: 180 tablet, Rfl: 0 .  Pancrelipase, Lip-Prot-Amyl, (ZENPEP PO), Take by mouth., Disp: , Rfl:    Allergies  Allergen Reactions  . Other Anaphylaxis and Rash    Bleach  . Penicillins Hives    Did it involve swelling of the face/tongue/throat, SOB, or low BP? No Did it involve sudden or severe rash/hives, skin peeling, or any reaction on the inside of your mouth or nose? Yes Did you need to seek medical attention at a hospital or doctor's office? Yes When did it last happen?Childhood allergy  If all above answers are "NO", may proceed with cephalosporin use.    . Sulfa Antibiotics Hives  . Aspirin Other (See Comments)     On aspirin 81 mg - Rectal bleeding in dec 2018  . Codeine     Headache and makes the patient feel "off"  The patient states she uses none for birth control. Last LMP was No LMP  recorded. Patient has had a hysterectomy.. Negative for Dysmenorrhea. Negative for: breast discharge, breast lump(s), breast pain and breast self exam. Associated symptoms include abnormal vaginal bleeding. Pertinent negatives include abnormal bleeding (hematology), anxiety, decreased libido, depression, difficulty falling sleep, dyspareunia, history of infertility, nocturia, sexual dysfunction, sleep disturbances, urinary incontinence, urinary urgency, vaginal discharge and vaginal itching. Diet regular.The patient states her exercise level is  minimal.   . The patient's tobacco use is:  Social History   Tobacco Use  Smoking Status Former Smoker  . Packs/day: 0.50  . Years: 25.00  . Pack years: 12.50  . Types: Cigarettes  Smokeless Tobacco Never Used  Tobacco Comment   quit smoking 20+ytrs ago  . She has been exposed to passive smoke. The patient's alcohol use is:  Social History   Substance and Sexual Activity  Alcohol Use No    Review of Systems  Constitutional: Negative.   HENT: Negative.   Eyes: Negative.  Negative for blurred vision.  Respiratory: Negative.  Negative for shortness of breath.   Cardiovascular: Negative.  Negative for chest pain and palpitations.  Endocrine: Negative.   Genitourinary: Negative.   Musculoskeletal: Negative.   Skin: Negative.   Allergic/Immunologic: Negative.   Neurological: Negative.   Hematological: Negative.   Psychiatric/Behavioral: Negative.      Today's Vitals   06/06/20 1156  BP: 114/66  Pulse: 63  Temp: 98.3 F (36.8 C)  TempSrc: Oral  Weight: 204 lb 6.4 oz (92.7 kg)  Height: 4' 11.2" (1.504 m)  PainSc: 0-No pain   Body mass index is 41.01 kg/m.  Wt Readings from Last 3 Encounters:  06/20/20 205 lb 3.2 oz (93.1 kg)  06/08/20 205 lb (93 kg)  06/06/20 204 lb 6.4 oz (92.7 kg)   Objective:  Physical Exam Vitals and nursing note reviewed.  Constitutional:      Appearance: Normal appearance. She is obese.  HENT:      Head: Normocephalic and atraumatic.     Right Ear: Tympanic membrane, ear canal and external ear normal.     Left Ear: Tympanic membrane, ear canal and external ear normal.     Nose:     Comments: Deferred, masked    Mouth/Throat:     Comments: Deferred, masked Eyes:     Extraocular Movements: Extraocular movements intact.     Conjunctiva/sclera: Conjunctivae normal.     Pupils: Pupils are equal, round, and reactive to light.  Cardiovascular:     Rate and Rhythm: Normal rate and regular rhythm.     Pulses: Normal pulses.     Heart sounds: Normal heart sounds.  Pulmonary:     Effort: Pulmonary effort is normal.     Breath sounds: Normal breath sounds.  Chest:  Breasts:     Tanner Score is 5.     Right: Normal.     Left: Normal.    Abdominal:     General: Bowel sounds are normal.     Palpations: Abdomen is soft.     Comments: Obese, soft  Genitourinary:    Comments: deferred Musculoskeletal:        General: Normal range of motion.     Cervical back: Normal range of motion and neck supple.     Right lower leg: Edema present.     Left lower leg: Edema present.  Feet:     Comments: She elected to keep shoes/socks on.  Skin:  General: Skin is warm and dry.  Neurological:     General: No focal deficit present.     Mental Status: She is alert and oriented to person, place, and time.  Psychiatric:        Mood and Affect: Mood normal.        Behavior: Behavior normal.         Assessment And Plan:     1. Routine general medical examination at a health care facility Comments: A full exam was performed. Importance of monthly self breast exams was stressed to the patient. PATIENT IS ADVISED TO GET 30-45 MINUTES REGULAR EXERCISE NO LESS THAN FOUR TO FIVE DAYS PER WEEK - BOTH WEIGHTBEARING EXERCISES AND AEROBIC ARE RECOMMENDED.  PATIENT IS ADVISED TO FOLLOW A HEALTHY DIET WITH AT LEAST SIX FRUITS/VEGGIES PER DAY, DECREASE INTAKE OF RED MEAT, AND TO INCREASE FISH INTAKE TO TWO  DAYS PER WEEK.  MEATS/FISH SHOULD NOT BE FRIED, BAKED OR BROILED IS PREFERABLE.  I SUGGEST WEARING SPF 50 SUNSCREEN ON EXPOSED PARTS AND ESPECIALLY WHEN IN THE DIRECT SUNLIGHT FOR AN EXTENDED PERIOD OF TIME.  PLEASE AVOID FAST FOOD RESTAURANTS AND INCREASE YOUR WATER INTAKE.  2. Malignant hypertension, heart failure & chronic kidney dis stage II ([place]) Comments: Chronic, well controlled. She will c/w current meds. Advised to follow low sodium diet. EKG not performed, one from previous visit reviewed. She agrees to rto in four months for re-evaluation.  - POCT Urinalysis Dipstick (81002) - POCT UA - Microalbumin  3. Type 2 diabetes mellitus with stage 2 chronic kidney disease, with long-term current use of insulin ([facility]) Comments: Diabetic foot exam was not performed, she elected to keep her shoes. She has been evaluated by Podiatry in the past 90 days. I DISCUSSED WITH THE PATIENT AT LENGTH REGARDING THE GOALS OF GLYCEMIC CONTROL AND POSSIBLE LONG-TERM COMPLICATIONS.  I  ALSO STRESSED THE IMPORTANCE OF COMPLIANCE WITH HOME GLUCOSE MONITORING, DIETARY RESTRICTIONS INCLUDING AVOIDANCE OF SUGARY DRINKS/PROCESSED FOODS,  ALONG WITH REGULAR EXERCISE.  I  ALSO STRESSED THE IMPORTANCE OF ANNUAL EYE EXAMS, SELF FOOT CARE AND COMPLIANCE WITH OFFICE VISITS.  - Hemoglobin A1c - CBC - CMP14+EGFR  4. Other iron deficiency anemia Chronic. I will check iron studies as below. I will make further recommendations once her labs are available for review.  - Iron and IBC ([id number]) - Ferritin  5. Recurrent depression ([place]) Comments: Chronic, she feels her symptoms are currently controlled.  She will continue with current meds.   6. Class 3 severe obesity due to excess calories with serious comorbidity and body mass index (BMI) of 40.0 to 44.9 in adult [facility]) Comments: She is encouraged to strive for BMI less than 37 to decrease cardiac risk. Advised to gradually increase her daily activity.   7. Immunization  due Comments: She was given high dose flu vaccine.  - Flu Vaccine QUAD High Dose(Fluad)  8. Drug therapy - Vitamin B12 She is encouraged to strive for BMI less than 30 to decrease cardiac risk. Advised to aim for at least 150 minutes of exercise per week.  Patient was given opportunity to ask questions. Patient verbalized understanding of the plan and was able to repeat key elements of the plan. All questions were answered to their satisfaction.  [name], MD   I, [name], MD, have reviewed all documentation for this visit. The documentation on 07/11/20 for the exam, diagnosis, procedures, and orders are all accurate and complete.  THE PATIENT IS ENCOURAGED TO  PRACTICE SOCIAL DISTANCING DUE TO THE COVID-19 PANDEMIC.

## 2020-06-07 ENCOUNTER — Other Ambulatory Visit: Payer: Self-pay

## 2020-06-07 ENCOUNTER — Other Ambulatory Visit: Payer: Self-pay | Admitting: Pulmonary Disease

## 2020-06-07 DIAGNOSIS — R0609 Other forms of dyspnea: Secondary | ICD-10-CM

## 2020-06-07 LAB — CBC
Hematocrit: 34.7 % (ref 34.0–46.6)
Hemoglobin: 10.5 g/dL — ABNORMAL LOW (ref 11.1–15.9)
MCH: 22.2 pg — ABNORMAL LOW (ref 26.6–33.0)
MCHC: 30.3 g/dL — ABNORMAL LOW (ref 31.5–35.7)
MCV: 74 fL — ABNORMAL LOW (ref 79–97)
Platelets: 341 10*3/uL (ref 150–450)
RBC: 4.72 x10E6/uL (ref 3.77–5.28)
RDW: 17.9 % — ABNORMAL HIGH (ref 11.7–15.4)
WBC: 4.6 10*3/uL (ref 3.4–10.8)

## 2020-06-07 LAB — CMP14+EGFR
ALT: 11 IU/L (ref 0–32)
AST: 14 IU/L (ref 0–40)
Albumin/Globulin Ratio: 1.1 — ABNORMAL LOW (ref 1.2–2.2)
Albumin: 4 g/dL (ref 3.7–4.7)
Alkaline Phosphatase: 127 IU/L — ABNORMAL HIGH (ref 44–121)
BUN/Creatinine Ratio: 12 (ref 12–28)
BUN: 11 mg/dL (ref 8–27)
Bilirubin Total: 0.3 mg/dL (ref 0.0–1.2)
CO2: 26 mmol/L (ref 20–29)
Calcium: 9.3 mg/dL (ref 8.7–10.3)
Chloride: 96 mmol/L (ref 96–106)
Creatinine, Ser: 0.89 mg/dL (ref 0.57–1.00)
GFR calc Af Amer: 75 mL/min/{1.73_m2} (ref >59)
GFR calc non Af Amer: 65 mL/min/{1.73_m2} (ref >59)
Globulin, Total: 3.6 g/dL (ref 1.5–4.5)
Glucose: 173 mg/dL — ABNORMAL HIGH (ref 65–99)
Potassium: 3.7 mmol/L (ref 3.5–5.2)
Sodium: 136 mmol/L (ref 134–144)
Total Protein: 7.6 g/dL (ref 6.0–8.5)

## 2020-06-07 LAB — HEMOGLOBIN A1C
Est. average glucose Bld gHb Est-mCnc: 140 mg/dL
Hgb A1c MFr Bld: 6.5 % — ABNORMAL HIGH (ref 4.8–5.6)

## 2020-06-07 LAB — IRON AND TIBC
Iron Saturation: 8 % — CL (ref 15–55)
Iron: 26 ug/dL — ABNORMAL LOW (ref 27–139)
Total Iron Binding Capacity: 318 ug/dL (ref 250–450)
UIBC: 292 ug/dL (ref 118–369)

## 2020-06-07 LAB — FERRITIN: Ferritin: 35 ng/mL (ref 15–150)

## 2020-06-07 LAB — VITAMIN B12: Vitamin B-12: 790 pg/mL (ref 232–1245)

## 2020-06-07 MED ORDER — DEXILANT 60 MG PO CPDR
60.0000 mg | DELAYED_RELEASE_CAPSULE | Freq: Every day | ORAL | 1 refills | Status: DC
Start: 2020-06-07 — End: 2020-06-14

## 2020-06-08 ENCOUNTER — Ambulatory Visit (INDEPENDENT_AMBULATORY_CARE_PROVIDER_SITE_OTHER): Payer: Medicare HMO | Admitting: Pulmonary Disease

## 2020-06-08 ENCOUNTER — Encounter: Payer: Self-pay | Admitting: Adult Health

## 2020-06-08 ENCOUNTER — Other Ambulatory Visit: Payer: Self-pay

## 2020-06-08 ENCOUNTER — Other Ambulatory Visit: Payer: Self-pay | Admitting: Neurology

## 2020-06-08 ENCOUNTER — Ambulatory Visit: Payer: Medicare HMO | Admitting: Adult Health

## 2020-06-08 VITALS — BP 138/64 | HR 61 | Temp 98.1°F | Ht 61.0 in | Wt 205.0 lb

## 2020-06-08 DIAGNOSIS — R0600 Dyspnea, unspecified: Secondary | ICD-10-CM | POA: Diagnosis not present

## 2020-06-08 DIAGNOSIS — I5032 Chronic diastolic (congestive) heart failure: Secondary | ICD-10-CM | POA: Diagnosis not present

## 2020-06-08 DIAGNOSIS — J8410 Pulmonary fibrosis, unspecified: Secondary | ICD-10-CM | POA: Insufficient documentation

## 2020-06-08 DIAGNOSIS — J9611 Chronic respiratory failure with hypoxia: Secondary | ICD-10-CM | POA: Diagnosis not present

## 2020-06-08 DIAGNOSIS — R911 Solitary pulmonary nodule: Secondary | ICD-10-CM | POA: Insufficient documentation

## 2020-06-08 DIAGNOSIS — G4733 Obstructive sleep apnea (adult) (pediatric): Secondary | ICD-10-CM | POA: Diagnosis not present

## 2020-06-08 DIAGNOSIS — R0609 Other forms of dyspnea: Secondary | ICD-10-CM

## 2020-06-08 DIAGNOSIS — E662 Morbid (severe) obesity with alveolar hypoventilation: Secondary | ICD-10-CM

## 2020-06-08 DIAGNOSIS — Z9989 Dependence on other enabling machines and devices: Secondary | ICD-10-CM

## 2020-06-08 LAB — PULMONARY FUNCTION TEST
DL/VA % pred: 123 %
DL/VA: 5.2 ml/min/mmHg/L
DLCO cor % pred: 106 %
DLCO cor: 18.52 ml/min/mmHg
DLCO unc % pred: 95 %
DLCO unc: 16.63 ml/min/mmHg
FEF 25-75 Post: 2.07 L/sec
FEF 25-75 Pre: 2.88 L/sec
FEF2575-%Change-Post: -28 %
FEF2575-%Pred-Post: 147 %
FEF2575-%Pred-Pre: 204 %
FEV1-%Change-Post: -5 %
FEV1-%Pred-Post: 109 %
FEV1-%Pred-Pre: 116 %
FEV1-Post: 1.67 L
FEV1-Pre: 1.78 L
FEV1FVC-%Change-Post: 1 %
FEV1FVC-%Pred-Pre: 114 %
FEV6-%Change-Post: -6 %
FEV6-%Pred-Post: 99 %
FEV6-%Pred-Pre: 106 %
FEV6-Post: 1.87 L
FEV6-Pre: 2.01 L
FEV6FVC-%Pred-Post: 104 %
FEV6FVC-%Pred-Pre: 104 %
FVC-%Change-Post: -6 %
FVC-%Pred-Post: 94 %
FVC-%Pred-Pre: 101 %
FVC-Post: 1.87 L
FVC-Pre: 2.01 L
Post FEV1/FVC ratio: 89 %
Post FEV6/FVC ratio: 100 %
Pre FEV1/FVC ratio: 88 %
Pre FEV6/FVC Ratio: 100 %
RV % pred: 83 %
RV: 1.74 L
TLC % pred: 82 %
TLC: 3.82 L

## 2020-06-08 NOTE — Assessment & Plan Note (Signed)
Patient had COVID-11 February 2020 , she is clinically improving. PFT shows normal lung function. o2 sat are improving with no desats with ambulation.  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  Has f/up CT chest in 3 month.   Plan  Patient Instructions  May discontinue oxygen Activity as tolerated Continue on CPAP at bedtime Work on healthy weight Follow-up in 4 months with Dr. Vaughan Browner and As needed

## 2020-06-08 NOTE — Patient Instructions (Signed)
May discontinue oxygen Activity as tolerated Continue on CPAP at bedtime Work on healthy weight Follow-up in 4 months with Dr. Vaughan Browner and As needed

## 2020-06-08 NOTE — Assessment & Plan Note (Signed)
8 mm RUL noted on CT chest 03/2020 . Has follow up CT chest in March to follow

## 2020-06-08 NOTE — Progress Notes (Signed)
PFT done today. 

## 2020-06-08 NOTE — Assessment & Plan Note (Signed)
Patient is doing well with no desaturations walking on room air.[name]  She would like to get rid of her portable oxygen.  Order was sent to [facility].

## 2020-06-08 NOTE — Assessment & Plan Note (Signed)
Appears compensated without evidence of volume overload on exam continue follow with cardiology and current regimen

## 2020-06-08 NOTE — Progress Notes (Signed)
$'@Patient'q$  ID: [name], female    DOB: 1945/07/19, 74 y.o.   MRN: [id number]  Chief Complaint  Patient presents with  . Follow-up  . Shortness of Breath    Referring provider: [name], MD  HPI: 74 year old female former smoker followed for obstructive sleep apnea and obesity hypoventilation syndrome. History of COVID-11 February 2020.  Received a monoclonal antibody infusion.  Also received azithromycin and dexamethasone and oxygen. Medical history significant for congestive heart failure, RV dysfunction, pulmonary hypertension hypertension, diabetes  TEST/EVENTS :  10/26/2019 FVC 1.52 [75%], FEV1 1.29 [83%], F/F 85, TLC 3.77 [81%], DLCO 16.54 [94%] Normal test  06/08/2020 Follow up : Dyspnea and COVID-19 history Patient returns for a 27-month follow-up.  Patient had a COVID-19 infection in August 2021.  She received a monoclonal antibody infusion.  She was also treated with azithromycin and dexamethasone.  She was also started on oxygen for exertional desaturations.  Patient has had ongoing shortness of breath since COVID-19.  Patient was set up for pulmonary function testing that was completed today that showed normal lung function with no airflow obstruction or restriction.  FEV1 was 116%, ratio 88, FVC 101%.  DLCO is 95%.  This is similar to her pulmonary function testing in May 2021.  Patient was set up for high-resolution CT chest March 29, 2020 that showed some scattered areas of pulmonary parenchymal groundglass consistent with evolving mild post Covid inflammatory fibrosis.  There was an 8 mm right upper lobe nodule.  A CT scan has been set up in 6 months to follow this.  And she also has some emphysema.  Since last visit patient is feeling better. She feels that her breathing is improving her activity tolerance is also increasing. She has not been using her oxygen as much. O2 saturations at home have been doing well. Today in the office walk test showed O2 saturations  with no significant desaturation walking on room air. She would like to get rid of her oxygen back to the homecare company.  Patient remains on CPAP at bedtime. Patient says she tries to wear CPAP each night. Feels rested but if she misses a night of her CPAP she does feel tired.    Allergies  Allergen Reactions  . Other Anaphylaxis and Rash    Bleach  . Penicillins Hives    Did it involve swelling of the face/tongue/throat, SOB, or low BP? No Did it involve sudden or severe rash/hives, skin peeling, or any reaction on the inside of your mouth or nose? Yes Did you need to seek medical attention at a hospital or doctor's office? Yes When did it last happen?Childhood allergy  If all above answers are "NO", may proceed with cephalosporin use.    . Sulfa Antibiotics Hives  . Aspirin Other (See Comments)     On aspirin 81 mg - Rectal bleeding in dec 2018  . Codeine     Headache and makes the patient feel "off"    Immunization History  Administered Date(s) Administered  . Fluad Quad(high Dose 65+) 06/06/2020  . Influenza Split 04/11/2011, 06/30/2013  . Influenza, High Dose Seasonal PF 02/14/2017, 04/24/2018  . Influenza-Unspecified 04/07/2014  . PFIZER SARS-COV-2 Vaccination 08/06/2019, 08/31/2019, 05/04/2020  . Tdap 05/18/2012  . Zoster 04/14/2014    Past Medical History:  Diagnosis Date  . Abnormal liver function     in the past.  . Anemia   . Arthritis    all over  . Bruises easily   . Cataract  right eye;immature  . Chronic back pain    stenosis  . Chronic cough   . Chronic kidney disease   . COPD (chronic obstructive pulmonary disease) ([place])   . Demand myocardial infarction [facility]) 2012   Demand Infarction in setting of Pancreatitis --> mild Troponin elevation, NON-OBSTRUCTIVE CAD  . Depression    takes Abilify daily as well as Zoloft  . Diastolic heart failure 7902   Grade 1 diastolic Dysfunction by Echo   . Diverticulosis   . DM (diabetes mellitus)  ([place])    takes Victoza daily as well as Lantus and Humalog  . Empty sella ([place])    on MRI in 2009.  . Glaucoma   . Headache(784.0)    last migraine-4-57yrs ago  . History of blood transfusion    no abnormal reaction noted  . History of colon polyps    benign  . HTN (hypertension)    takes [employer] daily  . Hyperlipidemia    takes Lipitor daily  . Joint swelling   . Nocturia   . Obesity hypoventilation syndrome ([place])   . Obstructive sleep apnea 02/2018   Notably improved split-night study with weight loss from 270 (2017) down to 250 pounds (2019).  . Pancreatitis    takes Pancrelipase daily  . Parkinson's disease ([place])    takes Sinemet daily  . Peripheral neuropathy   . Pneumonia 2012  . Seizures ([place])    takes Lamictal daily and Primidone nightly;last seizure 2wks ago  . Urinary urgency    With increased frequency  . Varicose veins of both lower extremities with pain    With edema.  Takes daily Lasix    Tobacco History: Social History   Tobacco Use  Smoking Status Former Smoker  . Packs/day: 0.50  . Years: 25.00  . Pack years: 12.50  . Types: Cigarettes  Smokeless Tobacco Never Used  Tobacco Comment   quit smoking 20+ytrs ago   Counseling given: Not Answered Comment: quit smoking 20+ytrs ago   Outpatient Medications Prior to Visit  Medication Sig Dispense Refill  . acetaminophen (TYLENOL) 500 MG tablet Take 500-1,000 mg by mouth every 4 (four) hours as needed for moderate pain.     Marland Kitchen albuterol (VENTOLIN HFA) 108 (90 Base) MCG/ACT inhaler Inhale 1-2 puffs into the lungs every 6 (six) hours as needed for wheezing or shortness of breath.    . Alcohol Swabs (ALCOHOL PADS) 70 % PADS Use as directed dx: e11.22 300 each 2  . Ascorbic Acid (VITAMIN C WITH ROSE HIPS) 1000 MG tablet Take 1,000 mg by mouth daily.    Marland Kitchen atorvastatin (LIPITOR) 10 MG tablet Take 1 tablet (10 mg total) by mouth every evening. 90 tablet 2  . B-D ULTRAFINE III SHORT PEN 31G X  8 MM MISC USE DAILY WITH VICTOZA AND/OR NOVOLOG. 400 each 1  . Blood Glucose Calibration (TRUE METRIX LEVEL 1) Low SOLN Use as directed  Dx:e11.22 1 each 2  . Blood Glucose Monitoring Suppl (TRUE METRIX METER) w/Device KIT Use as directed to check blood sugars 2 times per day dx: e11.22 1 kit 1  . carvedilol (COREG) 25 MG tablet Take 1 tablet (25 mg total) by mouth 2 (two) times daily with a meal. 180 tablet 0  . Cholecalciferol (VITAMIN D-3) 1000 units CAPS Take 5,000 Units by mouth daily.     Marland Kitchen dexlansoprazole (DEXILANT) 60 MG capsule Take 1 capsule (60 mg total) by mouth daily. 90 capsule 1  . diclofenac sodium (VOLTAREN) 1 %  GEL Apply 2 g topically 4 (four) times daily as needed (pain).    . ferrous sulfate 324 MG TBEC Take 324 mg by mouth.    . furosemide (LASIX) 80 MG tablet Take 80 mg by mouth daily. 57m daily Monday-Friday, 431mon Saturday and Sunday    . glucose blood (TRUE METRIX BLOOD GLUCOSE TEST) test strip Use as directed to check blood sugars 2 times per day dx: e11.22 300 each 2  . hydrocortisone cream 1 % Apply 1 application topically daily as needed for itching.     . insulin lispro (HUMALOG KWIKPEN) 100 UNIT/ML KwikPen Inject 0.02-0.04 mLs (2-4 Units total) into the skin 3 (three) times daily as needed (high blood sugar over 150). 15 mL 2  . Isopropyl Alcohol (ALCOHOL WIPES) 70 % MISC Apply 1 each topically 2 (two) times daily. 300 each 3  . isosorbide mononitrate (IMDUR) 30 MG 24 hr tablet Take 1 tablet (30 mg total) by mouth daily. 30 tablet 11  . lamoTRIgine (LAMICTAL) 150 MG tablet Take 1 tablet (150 mg total) by mouth 2 (two) times daily. 180 tablet 3  . levETIRAcetam (KEPPRA) 500 MG tablet TAKE 2 TABLETS AT BEDTIME 180 tablet 0  . Menthol, Topical Analgesic, (STOPAIN ROLL-ON EX) Apply 1 application topically daily as needed (pain).    . Marland Kitchenetolazone (ZAROXOLYN) 2.5 MG tablet     . neomycin-bacitracin-polymyxin (NEOSPORIN) ointment Apply 1 application topically as needed for  wound care.     . ondansetron (ZOFRAN) 4 MG tablet     . OZEMPIC, 1 MG/DOSE, 4 MG/3ML SOPN INJECT 1MG INTO THE SKIN ONCE A WEEK 9 mL 2  . Pancrelipase, Lip-Prot-Amyl, 25000-79000 units CPEP Take 1-2 capsules by mouth See admin instructions. Take 2 capsules in the morning, 1 capsule with lunch, and 2 capsules in the evening.    . potassium chloride SA (KLOR-CON) 20 MEQ tablet TAKE 2 TABLETS EVERY DAY BUT WHEN TAKING METOLAZONE TAKE 3 TABLETS DAILY AS DIRECTED 204 tablet 2  . sennosides-docusate sodium (SENOKOT-S) 8.6-50 MG tablet Take 1 tablet by mouth daily.    . sertraline (ZOLOFT) 50 MG tablet Take 1 tablet (50 mg total) by mouth daily. 90 tablet 2  . tolnaftate (TINACTIN) 1 % cream Apply 1 application topically daily as needed (foot fungus).     . Nelva NayOLOSTAR 300 UNIT/ML Solostar Pen INJECT 36 UNITS INTO THE SKIN AT BEDTIME. (Patient taking differently: 34 Units.) 9 pen 2  . TRUEplus Lancets 33G MISC Use as directed to check blood sugars 2 times per day dx: e11.22 300 each 2  . zinc sulfate 220 (50 Zn) MG capsule Take 220 mg by mouth daily.    . hydrALAZINE (APRESOLINE) 25 MG tablet Take 3 tablets (75 mg total) by mouth 3 (three) times daily. 270 tablet 3  . aspirin 81 MG chewable tablet Chew 1 tablet (81 mg total) by mouth daily. (Patient not taking: Reported on 06/08/2020)     No facility-administered medications prior to visit.     Review of Systems:   Constitutional:   No  weight loss, night sweats,  Fevers, chills,  +fatigue, or  lassitude.  HEENT:   No headaches,  Difficulty swallowing,  Tooth/dental problems, or  Sore throat,                No sneezing, itching, ear ache, nasal congestion, post nasal drip,   CV:  No chest pain,  Orthopnea, PND, swelling in lower extremities, anasarca, dizziness, palpitations, syncope.  GI  No heartburn, indigestion, abdominal pain, nausea, vomiting, diarrhea, change in bowel habits, loss of appetite, bloody stools.   Resp:  .  No chest  wall deformity  Skin: no rash or lesions.  GU: no dysuria, change in color of urine, no urgency or frequency.  No flank pain, no hematuria   MS:  No joint pain or swelling.  No decreased range of motion.  No back pain.    Physical Exam  BP 138/64 (BP Location: Left Arm, Cuff Size: Normal)   Pulse 61   Temp 98.1 F (36.7 C) (Temporal)   Ht 5' 1"  (1.549 m)   Wt 205 lb (93 kg)   SpO2 97%   BMI 38.73 kg/m   GEN: A/Ox3; pleasant , NAD, well nourished    HEENT:  [place]/AT,   NOSE-clear, THROAT-clear, no lesions, no postnasal drip or exudate noted.   NECK:  Supple w/ fair ROM; no JVD; normal carotid impulses w/o bruits; no thyromegaly or nodules palpated; no lymphadenopathy.    RESP  Clear  P & A; w/o, wheezes/ rales/ or rhonchi. no accessory muscle use, no dullness to percussion  CARD:  RRR, no m/r/g, tr  peripheral edema, pulses intact, no cyanosis or clubbing.  GI:   Soft & nt; nml bowel sounds; no organomegaly or masses detected.   Musco: Warm bil, no deformities or joint swelling noted.   Neuro: alert, no focal deficits noted.    Skin: Warm, no lesions or rashes    Lab Results:   ProBNP    Component Value Date/Time   PROBNP 99.1 08/29/2010 0445    Imaging: No results found.    PFT Results Latest Ref Rng & Units 06/08/2020 10/26/2019  FVC-Pre L 2.01 1.77  FVC-Predicted Pre % 101 88  FVC-Post L 1.87 1.52  FVC-Predicted Post % 94 75  Pre FEV1/FVC % % 88 88  Post FEV1/FCV % % 89 85  FEV1-Pre L 1.78 1.56  FEV1-Predicted Pre % 116 100  FEV1-Post L 1.67 1.29  DLCO uncorrected ml/min/mmHg 16.63 16.54  DLCO UNC% % 95 94  DLCO corrected ml/min/mmHg 18.52 -  DLCO COR %Predicted % 106 -  DLVA Predicted % 123 122  TLC L 3.82 3.77  TLC % Predicted % 82 81  RV % Predicted % 83 89    No results found for: NITRICOXIDE      Assessment & Plan:   Chronic respiratory failure with hypoxia ([place]) Patient is doing well with no desaturations walking on room air.Marland Kitchen   She would like to get rid of her portable oxygen.  Order was sent to [facility].  Chronic diastolic CHF (congestive heart failure), NYHA class 2 ([place]) Appears compensated without evidence of volume overload on exam continue follow with cardiology and current regimen  OSA on CPAP Continue on CPAP at bedtime.  Patient encouraged on compliance.  Plan  Patient Instructions  May discontinue oxygen Activity as tolerated Continue on CPAP at bedtime Work on healthy weight Follow-up in 4 months with Dr. Vaughan Browner and As needed        Postinflammatory pulmonary fibrosis [facility]) Patient had COVID-11 February 2020 , she is clinically improving. PFT shows normal lung function. o2 sat are improving with no desats with ambulation.  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  Has f/up CT chest in 3 month.   Plan  Patient Instructions  May discontinue oxygen Activity as tolerated Continue on CPAP at bedtime Work on healthy weight Follow-up in 4 months with  Dr. Vaughan Browner and As needed       Lung nodule 8 mm RUL noted on CT chest 03/2020 . Has follow up CT chest in March to follow      Rexene Edison, NP 06/08/2020

## 2020-06-08 NOTE — Assessment & Plan Note (Signed)
Continue on CPAP at bedtime.  Patient encouraged on compliance.  Plan  Patient Instructions  May discontinue oxygen Activity as tolerated Continue on CPAP at bedtime Work on healthy weight Follow-up in 4 months with Dr. Vaughan Browner and As needed

## 2020-06-09 ENCOUNTER — Telehealth: Payer: Medicare HMO

## 2020-06-09 ENCOUNTER — Telehealth: Payer: Self-pay

## 2020-06-09 NOTE — Telephone Encounter (Signed)
  Chronic Care Management   Outreach Note  06/09/2020 Name: [name] MRN: [id number] DOB: 02-Sep-1945  Referred by: [name], MD Reason for referral : [place] is enrolled in a Managed Medicaid Health Plan: No  A second unsuccessful telephone outreach was attempted today. The patient was referred to the case management team for assistance with care management and care coordination.   Follow Up Plan: A HIPAA compliant phone message was left for the patient providing contact information and requesting a return call.  The care management team will reach out to the patient again over the next 30 days.   Daneen Schick, BSW, CDP Social Worker, Certified Dementia Practitioner [place] [facility] [place] [facility] [phone]

## 2020-06-10 ENCOUNTER — Telehealth: Payer: Self-pay

## 2020-06-10 NOTE — Telephone Encounter (Cosign Needed)
  Chronic Care Management   Outreach Note  06/10/2020 Name: KIWANNA SPRAKER MRN: [id number] DOB: [birth year]  Referred by: [name], MD Reason for referral : Chronic Care Management (RN CM FU Call )   [name] is enrolled in a Managed Medicaid Health Plan: No  An unsuccessful telephone outreach was attempted today. The patient was referred to the case management team for assistance with care management and care coordination.   Follow Up Plan: A HIPAA compliant phone message was left for the patient providing contact information and requesting a return call.  Telephone follow up appointment with care management team member scheduled for: 06/27/20   Barb Merino, RN, BSN, CCM Care Management Coordinator [place] [facility]  Direct Phone: [phone]

## 2020-06-13 ENCOUNTER — Ambulatory Visit: Payer: Self-pay

## 2020-06-13 ENCOUNTER — Telehealth: Payer: Medicare HMO

## 2020-06-13 ENCOUNTER — Other Ambulatory Visit: Payer: Self-pay

## 2020-06-13 DIAGNOSIS — I5032 Chronic diastolic (congestive) heart failure: Secondary | ICD-10-CM

## 2020-06-13 DIAGNOSIS — E1122 Type 2 diabetes mellitus with diabetic chronic kidney disease: Secondary | ICD-10-CM

## 2020-06-13 DIAGNOSIS — G40909 Epilepsy, unspecified, not intractable, without status epilepticus: Secondary | ICD-10-CM

## 2020-06-14 ENCOUNTER — Other Ambulatory Visit: Payer: Self-pay

## 2020-06-14 MED ORDER — DEXILANT 60 MG PO CPDR: 60.0000 mg | DELAYED_RELEASE_CAPSULE | Freq: Every day | ORAL | 1 refills | Status: DC

## 2020-06-15 NOTE — Chronic Care Management (AMB) (Signed)
Chronic Care Management   Follow Up Note   06/13/2020 Name: [name] MRN: [id number] DOB: 1945/06/30  Referred by: [name], MD Reason for referral : Chronic Care Management (Inbound call from sister Sandra/RN CM FU Call)   [name] is a 74 y.o. year old female who is a primary care patient of [name], MD. The CCM team was consulted for assistance with chronic disease management and care coordination needs.    Review of patient status, including review of consultants reports, relevant laboratory and other test results, and collaboration with appropriate care team members and the patient's provider was performed as part of comprehensive patient evaluation and provision of chronic care management services.    SDOH (Social Determinants of Health) assessments performed: Yes  See Care Plan activities for detailed interventions related to [place])   Placed outbound CCM RN CM follow up call to patient's sister [name] for a care plan update.     Outpatient Encounter Medications as of 06/13/2020  Medication Sig  . acetaminophen (TYLENOL) 500 MG tablet Take 500-1,000 mg by mouth every 4 (four) hours as needed for moderate pain.   Marland Kitchen albuterol (VENTOLIN HFA) 108 (90 Base) MCG/ACT inhaler Inhale 1-2 puffs into the lungs every 6 (six) hours as needed for wheezing or shortness of breath.  . Alcohol Swabs (ALCOHOL PADS) 70 % PADS Use as directed dx: e11.22  . Ascorbic Acid (VITAMIN C WITH ROSE HIPS) 1000 MG tablet Take 1,000 mg by mouth daily.  Marland Kitchen atorvastatin (LIPITOR) 10 MG tablet Take 1 tablet (10 mg total) by mouth every evening.  . B-D ULTRAFINE III SHORT PEN 31G X 8 MM MISC USE DAILY WITH VICTOZA AND/OR NOVOLOG.  . Blood Glucose Calibration (TRUE METRIX LEVEL 1) Low SOLN Use as directed  Dx:e11.22  . Blood Glucose Monitoring Suppl (TRUE METRIX METER) w/Device KIT Use as directed to check blood sugars 2 times per day dx: e11.22  . carvedilol (COREG) 25 MG tablet Take 1  tablet (25 mg total) by mouth 2 (two) times daily with a meal.  . Cholecalciferol (VITAMIN D-3) 1000 units CAPS Take 5,000 Units by mouth daily.   . diclofenac sodium (VOLTAREN) 1 % GEL Apply 2 g topically 4 (four) times daily as needed (pain).  . ferrous sulfate 324 MG TBEC Take 324 mg by mouth.  . furosemide (LASIX) 80 MG tablet Take 80 mg by mouth daily. 38m daily Monday-Friday, 448mon Saturday and Sunday  . glucose blood (TRUE METRIX BLOOD GLUCOSE TEST) test strip Use as directed to check blood sugars 2 times per day dx: e11.22  . hydrocortisone cream 1 % Apply 1 application topically daily as needed for itching.   . insulin lispro (HUMALOG KWIKPEN) 100 UNIT/ML KwikPen Inject 0.02-0.04 mLs (2-4 Units total) into the skin 3 (three) times daily as needed (high blood sugar over 150).  . Isopropyl Alcohol (ALCOHOL WIPES) 70 % MISC Apply 1 each topically 2 (two) times daily.  . isosorbide mononitrate (IMDUR) 30 MG 24 hr tablet Take 1 tablet (30 mg total) by mouth daily.  . Marland KitchenamoTRIgine (LAMICTAL) 150 MG tablet Take 1 tablet (150 mg total) by mouth 2 (two) times daily.  . Marland KitchenevETIRAcetam (KEPPRA) 500 MG tablet TAKE 2 TABLETS AT BEDTIME  . Menthol, Topical Analgesic, (STOPAIN ROLL-ON EX) Apply 1 application topically daily as needed (pain).  . Marland Kitchenetolazone (ZAROXOLYN) 2.5 MG tablet   . neomycin-bacitracin-polymyxin (NEOSPORIN) ointment Apply 1 application topically as needed for wound care.   . ondansetron (  ZOFRAN) 4 MG tablet   . OZEMPIC, 1 MG/DOSE, 4 MG/3ML SOPN INJECT 1MG INTO THE SKIN ONCE A WEEK  . Pancrelipase, Lip-Prot-Amyl, 25000-79000 units CPEP Take 1-2 capsules by mouth See admin instructions. Take 2 capsules in the morning, 1 capsule with lunch, and 2 capsules in the evening.  . potassium chloride SA (KLOR-CON) 20 MEQ tablet TAKE 2 TABLETS EVERY DAY BUT WHEN TAKING METOLAZONE TAKE 3 TABLETS DAILY AS DIRECTED  . sennosides-docusate sodium (SENOKOT-S) 8.6-50 MG tablet Take 1 tablet by mouth  daily.  . sertraline (ZOLOFT) 50 MG tablet Take 1 tablet (50 mg total) by mouth daily.  Marland Kitchen tolnaftate (TINACTIN) 1 % cream Apply 1 application topically daily as needed (foot fungus).   [name] SOLOSTAR 300 UNIT/ML Solostar Pen INJECT 36 UNITS INTO THE SKIN AT BEDTIME. (Patient taking differently: 34 Units.)  . TRUEplus Lancets 33G MISC Use as directed to check blood sugars 2 times per day dx: e11.22  . zinc sulfate 220 (50 Zn) MG capsule Take 220 mg by mouth daily.  . [DISCONTINUED] dexlansoprazole (DEXILANT) 60 MG capsule Take 1 capsule (60 mg total) by mouth daily.   No facility-administered encounter medications on file as of 06/13/2020.     Objective:  Lab Results  Component Value Date   HGBA1C 6.5 (H) 06/06/2020   HGBA1C 7.6 (H) 11/09/2019   HGBA1C 8.0 (H) 08/24/2019   Lab Results  Component Value Date   MICROALBUR 10 06/06/2020   LDLCALC 65 12/17/2019   CREATININE 0.89 06/06/2020   BP Readings from Last 3 Encounters:  06/08/20 138/64  06/06/20 114/66  03/22/20 118/60    Goals Addressed    . "she's having some bizarre behaviors"   On track    Sister stated [place] (see longitudinal plan of care for additional care plan information)  Current Barriers:  Marland Kitchen Knowledge Deficits related to evaluation and treatment of cognitive changes with mood change . Chronic Disease Management support and education needs related to DM II, CHF, Seizure disorder . Cognitive Deficits  Nurse Case Manager Clinical Goal(s):  Marland Kitchen Over the next 45 days, patient will verbalize understanding of plan for evaluation and treatment of cognitive changes with mood disorder  Goal Met  CCM RN CM Interventions:  06/13/20 call completed with sister [name]  . Inter-disciplinary care team collaboration (see longitudinal plan of care) . Evaluation of current treatment plan related to Cognitive changes w/mood change and patient's adherence to plan as established by provider . Determined patient and  her sister Sandra's relationship is currently under a great deal of stress due to having several family disputes between the 2 of them dating back from years ago . Determined a family member, or possibly Ms. Schwan contacted APS . Collaborated with PCP Dr. [name], who was notified about the APS referral to evaluate the strained relationship and care for Ms. Wilds . Determined the patient continues to reside with her sister [name] and her husband, however it is her understanding that Ms. Bencomo will be relocating after the holidays . Determined Ms. Walsworth is also working with her niece to rename someone within her family to be her health care POA . Provided active listening to [name] and validated her feelings of concern . Offered to arrange a face to face visit within the PCP office to offer support and resources related to AD and the legalities involving health care POA, [name] declines and states "this is already in the works by Arbie Cookey and our niece"  .  Discussed plans with sister [name] for ongoing care management follow up and provided patient with direct contact information for care management team  Patient Self Care Activities:  . Patient will attend all scheduled provider appointments . Patient will call pharmacy for medication refills . Notify the CCM team and or PCP for resources and support related to AD and healthcare POA  Please see past updates related to this goal by clicking on the "Past Updates" button in the selected goal        Plan:   Telephone follow up appointment with care management team member scheduled for: 06/27/20  Barb Merino, RN, BSN, CCM Care Management Coordinator [place] [facility]  Direct Phone: [phone]

## 2020-06-15 NOTE — Patient Instructions (Signed)
Visit Information  Goals Addressed    . "she's having some bizarre behaviors"   On track    Sister stated [place] (see longitudinal plan of care for additional care plan information)  Current Barriers:  Marland Kitchen Knowledge Deficits related to evaluation and treatment of cognitive changes with mood change . Chronic Disease Management support and education needs related to DM II, CHF, Seizure disorder . Cognitive Deficits  Nurse Case Manager Clinical Goal(s):  Marland Kitchen Over the next 45 days, patient will verbalize understanding of plan for evaluation and treatment of cognitive changes with mood disorder  Goal Met  CCM RN CM Interventions:  06/13/20 call completed with sister [name]  . Inter-disciplinary care team collaboration (see longitudinal plan of care) . Evaluation of current treatment plan related to Cognitive changes w/mood change and patient's adherence to plan as established by provider . Determined patient and her sister Sandra's relationship is currently under a great deal of stress due to having several family disputes between the 2 of them dating back from years ago . Determined a family member, or possibly Ms. Wik contacted APS . Collaborated with PCP Dr. [name], who was notified about the APS referral to evaluate the strained relationship and care for Ms. Korman . Determined the patient continues to reside with her sister [name] and her husband, however it is her understanding that Ms. Roseman will be relocating after the holidays . Determined Ms. Fines is also working with her niece to rename someone within her family to be her health care POA . Provided active listening to [name] and validated her feelings of concern . Offered to arrange a face to face visit within the PCP office to offer support and resources related to AD and the legalities involving health care POA, [name] declines and states "this is already in the works by Arbie Cookey and our niece"   . Discussed plans with sister [name] for ongoing care management follow up and provided patient with direct contact information for care management team  Patient Self Care Activities:  . Patient will attend all scheduled provider appointments . Patient will call pharmacy for medication refills . Notify the CCM team and or PCP for resources and support related to AD and healthcare POA  Please see past updates related to this goal by clicking on the "Past Updates" button in the selected goal         The patient verbalized understanding of instructions, educational materials, and care plan provided today and declined offer to receive copy of patient instructions, educational materials, and care plan.   Telephone follow up appointment with care management team member scheduled for: 06/27/20  Lynne Logan, RN

## 2020-06-16 ENCOUNTER — Ambulatory Visit: Payer: Self-pay

## 2020-06-16 ENCOUNTER — Telehealth: Payer: Medicare HMO

## 2020-06-16 ENCOUNTER — Other Ambulatory Visit: Payer: Self-pay

## 2020-06-16 DIAGNOSIS — G40909 Epilepsy, unspecified, not intractable, without status epilepticus: Secondary | ICD-10-CM

## 2020-06-16 DIAGNOSIS — E1122 Type 2 diabetes mellitus with diabetic chronic kidney disease: Secondary | ICD-10-CM

## 2020-06-16 DIAGNOSIS — F339 Major depressive disorder, recurrent, unspecified: Secondary | ICD-10-CM

## 2020-06-16 DIAGNOSIS — I5032 Chronic diastolic (congestive) heart failure: Secondary | ICD-10-CM

## 2020-06-16 NOTE — Chronic Care Management (AMB) (Signed)
Chronic Care Management   Follow Up Note   06/16/2020 Name: [name] MRN: [id number] DOB: [birth year]  Referred by: [name] Chard, MD Reason for referral : Chronic Care Management (Inbound call from sister [name] )   [name] is a 74 y.o. year old female who is a primary care patient of [name] Chard, MD. The CCM team was consulted for assistance with chronic disease management and care coordination needs.    Review of patient status, including review of consultants reports, relevant laboratory and other test results, and collaboration with appropriate care team members and the patient's provider was performed as part of comprehensive patient evaluation and provision of chronic care management services.    SDOH (Social Determinants of Health) assessments performed: Yes See Care Plan activities for detailed interventions related to [name])    Completed inbound call with sister [name] with concerns regarding patient's need for support and resources to assist with housing.   Outpatient Encounter Medications as of 06/16/2020  Medication Sig  . acetaminophen (TYLENOL) 500 MG tablet Take 500-1,000 mg by mouth every 4 (four) hours as needed for moderate pain.   Marland Kitchen albuterol (VENTOLIN HFA) 108 (90 Base) MCG/ACT inhaler Inhale 1-2 puffs into the lungs every 6 (six) hours as needed for wheezing or shortness of breath.  . Alcohol Swabs (ALCOHOL PADS) 70 % PADS Use as directed dx: e11.22  . Ascorbic Acid (VITAMIN C WITH ROSE HIPS) 1000 MG tablet Take 1,000 mg by mouth daily.  Marland Kitchen atorvastatin (LIPITOR) 10 MG tablet Take 1 tablet (10 mg total) by mouth every evening.  . B-D ULTRAFINE III SHORT PEN 31G X 8 MM MISC USE DAILY WITH VICTOZA AND/OR NOVOLOG.  . Blood Glucose Calibration (TRUE METRIX LEVEL 1) Low SOLN Use as directed  Dx:e11.22  . Blood Glucose Monitoring Suppl (TRUE METRIX METER) w/Device KIT Use as directed to check blood sugars 2 times per day dx: e11.22  . carvedilol (COREG)  25 MG tablet Take 1 tablet (25 mg total) by mouth 2 (two) times daily with a meal.  . Cholecalciferol (VITAMIN D-3) 1000 units CAPS Take 5,000 Units by mouth daily.   Marland Kitchen dexlansoprazole (DEXILANT) 60 MG capsule Take 1 capsule (60 mg total) by mouth daily.  . diclofenac sodium (VOLTAREN) 1 % GEL Apply 2 g topically 4 (four) times daily as needed (pain).  . ferrous sulfate 324 MG TBEC Take 324 mg by mouth.  . furosemide (LASIX) 80 MG tablet Take 80 mg by mouth daily. 47m daily Monday-Friday, 414mon Saturday and Sunday  . glucose blood (TRUE METRIX BLOOD GLUCOSE TEST) test strip Use as directed to check blood sugars 2 times per day dx: e11.22  . hydrALAZINE (APRESOLINE) 25 MG tablet Take 3 tablets (75 mg total) by mouth 3 (three) times daily.  . hydrocortisone cream 1 % Apply 1 application topically daily as needed for itching.   . insulin lispro (HUMALOG KWIKPEN) 100 UNIT/ML KwikPen Inject 0.02-0.04 mLs (2-4 Units total) into the skin 3 (three) times daily as needed (high blood sugar over 150).  . Isopropyl Alcohol (ALCOHOL WIPES) 70 % MISC Apply 1 each topically 2 (two) times daily.  . isosorbide mononitrate (IMDUR) 30 MG 24 hr tablet Take 1 tablet (30 mg total) by mouth daily.  . Marland KitchenamoTRIgine (LAMICTAL) 150 MG tablet Take 1 tablet (150 mg total) by mouth 2 (two) times daily.  . Marland KitchenevETIRAcetam (KEPPRA) 500 MG tablet TAKE 2 TABLETS AT BEDTIME  . Menthol, Topical Analgesic, (STOPAIN ROLL-ON EX) Apply  1 application topically daily as needed (pain).  Marland Kitchen metolazone (ZAROXOLYN) 2.5 MG tablet   . neomycin-bacitracin-polymyxin (NEOSPORIN) ointment Apply 1 application topically as needed for wound care.   . ondansetron (ZOFRAN) 4 MG tablet   . OZEMPIC, 1 MG/DOSE, 4 MG/3ML SOPN INJECT 1MG INTO THE SKIN ONCE A WEEK  . Pancrelipase, Lip-Prot-Amyl, 25000-79000 units CPEP Take 1-2 capsules by mouth See admin instructions. Take 2 capsules in the morning, 1 capsule with lunch, and 2 capsules in the evening.  .  potassium chloride SA (KLOR-CON) 20 MEQ tablet TAKE 2 TABLETS EVERY DAY BUT WHEN TAKING METOLAZONE TAKE 3 TABLETS DAILY AS DIRECTED  . sennosides-docusate sodium (SENOKOT-S) 8.6-50 MG tablet Take 1 tablet by mouth daily.  . sertraline (ZOLOFT) 50 MG tablet Take 1 tablet (50 mg total) by mouth daily.  Marland Kitchen tolnaftate (TINACTIN) 1 % cream Apply 1 application topically daily as needed (foot fungus).   [name] SOLOSTAR 300 UNIT/ML Solostar Pen INJECT 36 UNITS INTO THE SKIN AT BEDTIME. (Patient taking differently: 34 Units.)  . TRUEplus Lancets 33G MISC Use as directed to check blood sugars 2 times per day dx: e11.22  . zinc sulfate 220 (50 Zn) MG capsule Take 220 mg by mouth daily.   No facility-administered encounter medications on file as of 06/16/2020.     Objective:  Lab Results  Component Value Date   HGBA1C 6.5 (H) 06/06/2020   HGBA1C 7.6 (H) 11/09/2019   HGBA1C 8.0 (H) 08/24/2019   Lab Results  Component Value Date   MICROALBUR 10 06/06/2020   LDLCALC 65 12/17/2019   CREATININE 0.89 06/06/2020   BP Readings from Last 3 Encounters:  06/08/20 138/64  06/06/20 114/66  03/22/20 118/60    Goals Addressed     Patient Care Plan: Depression (Adult)    Problem Identified: Symptoms (Depression)   Priority: High    Goal: Symptoms Monitored and Managed   Start Date: 06/16/2020  Expected End Date: 07/18/2020  This Visit's Progress: On track  Priority: High  Note:   Current Barriers:   Ineffective Self Health Maintenance  Currently UNABLE TO independently self manage needs related to chronic health conditions.   Knowledge Deficits related to short term plan for care coordination needs and long term plans for chronic disease management needs Nurse Case Manager Clinical Goal(s):   Over the next 90 days, patient will work with care management team to address care coordination and chronic disease management needs related to Disease Management  Educational Needs  Care  Coordination  Medication Management and Education  Psychosocial Support   Interventions:  . Inbound call from patient's sister [name] regarding concerns that patient will need assistance with housing . Determined the patient has stated to her sister that she no longer wishes for her to be her healthcare POA and the two of them have decided the patient will relocate from her sister Sandra's home . Determined [name] is concerned that her sister may needs assistance with locating the appropriate housing  . Discussed sister's request to have the embedded BSW contact the patient by phone next week if available to offer support and resources . Sent in basket message to embedded BSW Daneen Schick with an update on patient's housing status and sister Sandra's concerns Patient Goals/Self Care Activities Over the next 30 days, patient will: -work with embedded BSW to address housing needs -notify PCP of new or worsening symptoms of anxiety or depression  -take medications exactly as prescribed without missed doses   Follow Up  Plan: Telephone follow up appointment with care management team member scheduled for: 06/27/20      Plan:   Telephone follow up appointment with care management team member scheduled for: 06/27/20  Barb Merino, RN, BSN, CCM Care Management Coordinator [place] [facility]  Direct Phone: [phone]

## 2020-06-16 NOTE — Patient Instructions (Signed)
Visit Information  Goals Addressed    . Manage symptoms of depression and Assist with Housing       Timeframe:  Short-Term Goal Priority:  High Start Date:  06/16/21                           Expected End Date:  07/18/20                        Follow up date: 07/08/20  Over the next 30 days, patient will:  -work with embedded BSW to address housing needs -notify PCP of new or worsening symptoms of anxiety or depression  -take medications exactly as prescribed without missed doses         The patient verbalized understanding of instructions, educational materials, and care plan provided today and declined offer to receive copy of patient instructions, educational materials, and care plan.   Telephone follow up appointment with care management team member scheduled for: 06/27/20  Lynne Logan, RN

## 2020-06-20 ENCOUNTER — Ambulatory Visit (HOSPITAL_COMMUNITY)
Admission: RE | Admit: 2020-06-20 | Discharge: 2020-06-20 | Disposition: A | Discharge status: Home / Self Care | Payer: Medicare HMO | Source: Ambulatory Visit | Attending: Cardiology | Admitting: Cardiology

## 2020-06-20 ENCOUNTER — Other Ambulatory Visit: Payer: Self-pay

## 2020-06-20 ENCOUNTER — Encounter (HOSPITAL_COMMUNITY): Payer: Self-pay | Admitting: Cardiology

## 2020-06-20 VITALS — BP 124/70 | HR 69 | Wt 205.2 lb

## 2020-06-20 DIAGNOSIS — E662 Morbid (severe) obesity with alveolar hypoventilation: Secondary | ICD-10-CM | POA: Diagnosis not present

## 2020-06-20 DIAGNOSIS — Z8249 Family history of ischemic heart disease and other diseases of the circulatory system: Secondary | ICD-10-CM | POA: Diagnosis not present

## 2020-06-20 DIAGNOSIS — Z87891 Personal history of nicotine dependence: Secondary | ICD-10-CM | POA: Insufficient documentation

## 2020-06-20 DIAGNOSIS — J439 Emphysema, unspecified: Secondary | ICD-10-CM | POA: Insufficient documentation

## 2020-06-20 DIAGNOSIS — E119 Type 2 diabetes mellitus without complications: Secondary | ICD-10-CM | POA: Insufficient documentation

## 2020-06-20 DIAGNOSIS — Z794 Long term (current) use of insulin: Secondary | ICD-10-CM | POA: Insufficient documentation

## 2020-06-20 DIAGNOSIS — I272 Pulmonary hypertension, unspecified: Secondary | ICD-10-CM | POA: Diagnosis not present

## 2020-06-20 DIAGNOSIS — I5032 Chronic diastolic (congestive) heart failure: Secondary | ICD-10-CM | POA: Diagnosis not present

## 2020-06-20 DIAGNOSIS — E785 Hyperlipidemia, unspecified: Secondary | ICD-10-CM | POA: Diagnosis not present

## 2020-06-20 DIAGNOSIS — Z79899 Other long term (current) drug therapy: Secondary | ICD-10-CM | POA: Diagnosis not present

## 2020-06-20 DIAGNOSIS — I110 Hypertensive heart disease with heart failure: Secondary | ICD-10-CM | POA: Insufficient documentation

## 2020-06-20 DIAGNOSIS — Z833 Family history of diabetes mellitus: Secondary | ICD-10-CM | POA: Diagnosis not present

## 2020-06-20 DIAGNOSIS — G4733 Obstructive sleep apnea (adult) (pediatric): Secondary | ICD-10-CM | POA: Diagnosis not present

## 2020-06-20 DIAGNOSIS — Z8616 Personal history of COVID-19: Secondary | ICD-10-CM | POA: Insufficient documentation

## 2020-06-20 HISTORY — DX: COVID-19: U07.1

## 2020-06-20 LAB — BASIC METABOLIC PANEL
Anion gap: 9 (ref 5–15)
BUN: 14 mg/dL (ref 8–23)
CO2: 30 mmol/L (ref 22–32)
Calcium: 9.4 mg/dL (ref 8.9–10.3)
Chloride: 96 mmol/L — ABNORMAL LOW (ref 98–111)
Creatinine, Ser: 0.88 mg/dL (ref 0.44–1.00)
GFR, Estimated: >60 mL/min (ref >60)
Glucose, Bld: 156 mg/dL — ABNORMAL HIGH (ref 70–99)
Potassium: 3.5 mmol/L (ref 3.5–5.1)
Sodium: 135 mmol/L (ref 135–145)

## 2020-06-20 LAB — LIPID PANEL
Cholesterol: 144 mg/dL (ref 0–200)
HDL: 63 mg/dL (ref >40)
LDL Cholesterol: 70 mg/dL (ref 0–99)
Total CHOL/HDL Ratio: 2.3 RATIO
Triglycerides: 55 mg/dL (ref <150)
VLDL: 11 mg/dL (ref 0–40)

## 2020-06-20 MED ORDER — EMPAGLIFLOZIN 10 MG PO TABS: 10.0000 mg | ORAL_TABLET | Freq: Every day | ORAL | 3 refills | Status: DC

## 2020-06-20 NOTE — Patient Instructions (Addendum)
START Jardiance 10mg  (1 tablet) Daily  Labs done today, your results will be available in MyChart, we will contact you for abnormal readings.  Your physician recommends that you schedule repeat labs in 7-10 days  Your physician recommends that you schedule a follow-up appointment in: 4 months  If you have any questions or concerns before your next appointment please send 9-10 a message through [place] or call our office at [phone].    TO LEAVE A MESSAGE FOR THE NURSE SELECT OPTION 2, PLEASE LEAVE A MESSAGE INCLUDING: . YOUR NAME . DATE OF BIRTH . CALL BACK NUMBER . REASON FOR CALL**this is important as we prioritize the call backs  YOU WILL RECEIVE A CALL BACK THE SAME DAY AS LONG AS YOU CALL BEFORE 4:00 PM

## 2020-06-20 NOTE — Progress Notes (Signed)
PCP: [name], MD Cardiology: Dr. Ellyn Hack HF Cardiology: Dr. [name]  74 y.o. with history of OHS/OSA and RV dysfunction was referred by Dr. Ellyn Hack for evaluation of CHF.  She has a history of OSA/OHS.  She uses CPAP at night but not currently using oxygen during the day.  Remote smoker.  She had an echo in 9/20 showing EF 50-55% but moderate-severe RV dysfunction with mild-moderate RV dilation.  RHC/LHC in 10/20 showed normal coronaries, pulmonary venous hypertension.  V/Q scan in 2/21 did not show evidence for acute on chronic PE.    Cardiomems was placed in 5/21.  RHC at that time showed normal filling pressures and moderate pulmonary hypertension but suspect due to high output.  No evidence for shunt lesion.  Echo showed EF 60-65%, enlarged RV with normal systolic function.   She was admitted in 5/21 with syncope, thought to be due to a seizure.    She had COVID-19 infection in 8/21. 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   Echo in 9/21 with EF 50-55%, severe RV enlargement and severely decreased RV systolic function, PASP 54 mmHg.   She returns for followup of CHF.  Weight is down 14 lbs.  Still short of breath cleaning the bathroom and vacuuming.  Per her sister, she is short of breath walking across the house.  No orthopnea/PND.  No chest pain. No lightheadedness. Poor balance.  Oxygen saturation with ambulation did not drop significantly today.   Labs (10/20): K 4.1, creatinine 1.15, hgb 11.4 Labs (6/21): K 3.5, creatinine 1.04, BNP 81 Labs (12/21): K 3.7, creatinine 0.79  Cardiomems 23 mmHg (goal 23 mmHg)  PMH: 1. OHS/OSA: She uses CPAP.  2. H/o CVA 3. Seizure disorder 4. Type 2 DM 5. Depression 6. HTN 7. Hyperlipidemia 8. Chronic diastolic CHF: Echo (0/09) with EF 50-55%, moderate to severe RV dilation with mild-moderately decreased RV systolic function.  - RHC/LHC  (10/20): normal coronaries; mean RA 10, PA 56/14 mean 31, mean PCWP 13, LVEDP 18, CO/CI 7.35/3.73, PVR 2.44.  - RHC/Cardiomems placement (5/21): mean RA 7, PA 58/21 mean 34, PCWP mean 12, CI 8.3 F/3.95 T, PVR 2.86 WU. No evidence for shunt lesion.  - Echo (5/21): EF 60-65%, RV enlarged with normal systolic function.  - Echo (9/21): EF 50-55%, severe RV enlargement and severely decreased RV systolic function, PASP 54 mmHg.  9. Primarily pulmonary venous hypertension.  - V/Q scan (2/21): No evidence for acute or chronic PE.  - PFTs normal in 5/21.  10. COVID-19 PNA 8/21.  - CT chest in 10/21 with emphysema and mild fibrosis concerning for post-infectious inflammatory fibrosis from COVID.   Social History   Socioeconomic History  . Marital status: Divorced    Spouse name: Not on file  . Number of children: Not on file  . Years of education: Not on file  . Highest education level: Not on file  Occupational History  . Occupation: Disabled    Comment: CNA  Tobacco Use  . Smoking status: Former Smoker    Packs/day: 0.50    Years: 25.00    Pack years: 12.50    Types: Cigarettes  . Smokeless tobacco: Never Used  . Tobacco comment: quit smoking 20+ytrs ago  Vaping Use  . Vaping Use: Never used  Substance and Sexual Activity  . Alcohol use: No  . Drug use: No  . Sexual activity: Not Currently  Birth control/protection: Surgical  Other Topics Concern  . Not on file  Social History Narrative   She lives in [place]. She has lots of family in the area and is accompanied by her sister.   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   She has a distant history of smoking, quit 20 years ago.   Social Determinants of Health   Financial Resource Strain: Low Risk   . Difficulty of Paying Living Expenses: Not hard at all  Food Insecurity: No Food Insecurity  . Worried About Charity fundraiser in the Last Year: Never true  . Ran Out of Food in the Last  Year: Never true  Transportation Needs: No Transportation Needs  . Lack of Transportation (Medical): No  . Lack of Transportation (Non-Medical): No  Physical Activity: Inactive  . Days of Exercise per Week: 0 days  . Minutes of Exercise per Session: 0 min  Stress: No Stress Concern Present  . Feeling of Stress : Not at all  Social Connections: Not on file  Intimate Partner Violence: Not on file   Family History  Problem Relation Age of Onset  . Allergies Father   . Heart disease Father        before 48  . Heart failure Father   . Hypertension Father   . Hyperlipidemia Father   . Heart disease Mother   . Diabetes Mother   . Hypertension Mother   . Hyperlipidemia Mother   . Hypertension Sister   . Heart disease Sister        before 53  . Hyperlipidemia Sister   . Diabetes Brother   . Hypertension Brother   . Diabetes Sister   . Hypertension Sister   . Diabetes Son   . Hypertension Son   . Cancer Other    Current Outpatient Medications  Medication Sig Dispense Refill  . acetaminophen (TYLENOL) 500 MG tablet Take 500-1,000 mg by mouth every 4 (four) hours as needed for moderate pain.     Marland Kitchen albuterol (VENTOLIN HFA) 108 (90 Base) MCG/ACT inhaler Inhale 1-2 puffs into the lungs every 6 (six) hours as needed for wheezing or shortness of breath.    . Alcohol Swabs (ALCOHOL PADS) 70 % PADS Use as directed dx: e11.22 300 each 2  . Ascorbic Acid (VITAMIN C WITH ROSE HIPS) 1000 MG tablet Take 1,000 mg by mouth daily.    Marland Kitchen atorvastatin (LIPITOR) 10 MG tablet Take 1 tablet (10 mg total) by mouth every evening. 90 tablet 2  . B-D ULTRAFINE III SHORT PEN 31G X 8 MM MISC USE DAILY WITH VICTOZA AND/OR NOVOLOG. 400 each 1  . Blood Glucose Calibration (TRUE METRIX LEVEL 1) Low SOLN Use as directed  Dx:e11.22 1 each 2  . Blood Glucose Monitoring Suppl (TRUE METRIX METER) w/Device KIT Use as directed to check blood sugars 2 times per day dx: e11.22 1 kit 1  . carvedilol (COREG) 25 MG tablet  Take 1 tablet (25 mg total) by mouth 2 (two) times daily with a meal. 180 tablet 0  . Cholecalciferol (VITAMIN D-3) 1000 units CAPS Take 5,000 Units by mouth daily.     Marland Kitchen dexlansoprazole (DEXILANT) 60 MG capsule Take 1 capsule (60 mg total) by mouth daily. 90 capsule 1  . diclofenac sodium (VOLTAREN) 1 % GEL Apply 2 g topically 4 (four) times daily as needed (pain).    . ferrous sulfate 324 MG TBEC Take 324 mg by mouth 2 (  two) times daily.    . furosemide (LASIX) 80 MG tablet Take 80 mg by mouth daily. 18m daily Monday-Friday, 422mon Saturday and Sunday    . glucose blood (TRUE METRIX BLOOD GLUCOSE TEST) test strip Use as directed to check blood sugars 2 times per day dx: e11.22 300 each 2  . hydrALAZINE (APRESOLINE) 25 MG tablet Take 100 mg by mouth 3 (three) times daily.    . hydrocortisone cream 1 % Apply 1 application topically daily as needed for itching.     . insulin glargine, 2 Unit Dial, (TOUJEO MAX SOLOSTAR) 300 UNIT/ML Solostar Pen Inject 25 Units into the skin.    . Marland Kitchennsulin lispro (HUMALOG KWIKPEN) 100 UNIT/ML KwikPen Inject 0.02-0.04 mLs (2-4 Units total) into the skin 3 (three) times daily as needed (high blood sugar over 150). 15 mL 2  . Isopropyl Alcohol (ALCOHOL WIPES) 70 % MISC Apply 1 each topically 2 (two) times daily. 300 each 3  . isosorbide mononitrate (IMDUR) 30 MG 24 hr tablet Take 1 tablet (30 mg total) by mouth daily. 30 tablet 11  . lamoTRIgine (LAMICTAL) 150 MG tablet Take 1 tablet (150 mg total) by mouth 2 (two) times daily. 180 tablet 3  . levETIRAcetam (KEPPRA) 500 MG tablet TAKE 2 TABLETS AT BEDTIME 180 tablet 0  . Menthol, Topical Analgesic, (STOPAIN ROLL-ON EX) Apply 1 application topically daily as needed (pain).    . Marland Kitcheneomycin-bacitracin-polymyxin (NEOSPORIN) ointment Apply 1 application topically as needed for wound care.     . ondansetron (ZOFRAN) 4 MG tablet     . OZEMPIC, 1 MG/DOSE, 4 MG/3ML SOPN INJECT 1MG INTO THE SKIN ONCE A WEEK 9 mL 2  . Pancrelipase,  Lip-Prot-Amyl, (ZENPEP PO) Take by mouth.    . Pancrelipase, Lip-Prot-Amyl, 25000-79000 units CPEP Take 1-2 capsules by mouth See admin instructions. Take 2 capsules in the morning, 1 capsule with lunch, and 2 capsules in the evening.    . potassium chloride SA (KLOR-CON) 20 MEQ tablet TAKE 2 TABLETS EVERY DAY BUT WHEN TAKING METOLAZONE TAKE 3 TABLETS DAILY AS DIRECTED 204 tablet 2  . sennosides-docusate sodium (SENOKOT-S) 8.6-50 MG tablet Take 1 tablet by mouth daily.    . sertraline (ZOLOFT) 50 MG tablet Take 1 tablet (50 mg total) by mouth daily. 90 tablet 2  . tolnaftate (TINACTIN) 1 % cream Apply 1 application topically daily as needed (foot fungus).     . TRUEplus Lancets 33G MISC Use as directed to check blood sugars 2 times per day dx: e11.22 300 each 2  . zinc sulfate 220 (50 Zn) MG capsule Take 220 mg by mouth daily.    . empagliflozin (JARDIANCE) 10 MG TABS tablet Take 1 tablet (10 mg total) by mouth daily before breakfast. 30 tablet 3   No current facility-administered medications for this encounter.   BP 124/70   Pulse 69   Wt 93.1 kg (205 lb 3.2 oz)   SpO2 95%   BMI 38.77 kg/m  General: NAD Neck: No JVD, no thyromegaly or thyroid nodule.  Lungs: Mild dry crackles at bases.  CV: Nondisplaced PMI.  Heart regular S1/S2, no S3/S4, no murmur.  No peripheral edema.  No carotid bruit.  Normal pedal pulses.  Abdomen: Soft, nontender, no hepatosplenomegaly, no distention.  Skin: Intact without lesions or rashes.  Neurologic: Alert and oriented x 3.  Psych: Normal affect. Extremities: No clubbing or cyanosis.  HEENT: Normal.   Assessment/Plan: 1. Chronic diastolic CHF/RV failure: Echo in 9/20 showed EF 50-55%  with moderate-severe RV dilation and mild-moderately decreased systolic function. RHC/LHC showed mild-moderate pulmonary venous hypertension and no significant coronary disease.  Cause of RV dysfunction is not clear, possibly due to diastolic CHF + OHS/OSA.  V/Q scan in 2/21  did not show acute or chronic PE.  Cardiomems was placed in 5/21, normal filling pressures with moderate pulmonary hypertension likely due to high output (no evidence for shunt lesion on RHC).  Echo in 9/21 with EF 50-55%, severe RVE with severe RV dysfunction.  She is not volume overloaded today by exam or Cardiomems and weight is down.  However, she has NYHA class III symptoms that have worsened since she had COVID.  I suspect that her dyspnea is primarily pulmonary, based on last CT, she has developed mild pulmonary fibrosis since COVID, possibly post-inflammatory.   - Continue Lasix 80 mg daily.  - With chronic diastolic CHF, will add Jardiance 10 mg daily with BMET in 10 days.    2. OHS/OSA: She is using CPAP but not oxygen during the day.  Oxygen saturation not significantly low with ambulation today. CT chest in 10/21 with emphysema and mild fibrosis, possibly post-inflammatory after COVID-19 infection.   3. Pulmonary hypertension: RHC in 10/20 showed mild-moderate pulmonary venous hypertension.  RHC in 5/21 showed moderate pulmonary hypertension with low PVR and high CO, likely PH due to high output, no evidence for shunt lesion. No role for selective pulmonary vasodilators.   Followup in 4 months.   Loralie Champagne 06/20/2020

## 2020-06-21 ENCOUNTER — Ambulatory Visit: Payer: Medicare HMO

## 2020-06-21 DIAGNOSIS — N182 Chronic kidney disease, stage 2 (mild): Secondary | ICD-10-CM

## 2020-06-21 DIAGNOSIS — I5032 Chronic diastolic (congestive) heart failure: Secondary | ICD-10-CM

## 2020-06-21 NOTE — Chronic Care Management (AMB) (Signed)
  Chronic Care Management   Outreach Note  06/21/2020 Name: [name] MRN: [id number] DOB: [birth year]  Referred by: [name], MD Reason for referral : Care Coordination   [name] is enrolled in a Managed Medicaid Health Plan: No  SW placed a successful outbound call to the patient to assist with alternative placement options. The patient reports she is no longer interested in moving out of her sisters home. Scheduled follow up call over the next 21 days to assess long term plan.  Follow Up Plan: Collaboration with Dr. Allyne Gee and RN CM Delsa Sale to inform of patients desire to remain in her sisters home and plan for follow up over the next three weeks.  Bevelyn Ngo, BSW, CDP Social Worker, Certified Dementia Practitioner [facility] Care Management [phone]

## 2020-06-22 ENCOUNTER — Other Ambulatory Visit: Payer: Self-pay | Admitting: Internal Medicine

## 2020-06-22 ENCOUNTER — Encounter: Payer: Medicare HMO | Admitting: Internal Medicine

## 2020-06-27 ENCOUNTER — Other Ambulatory Visit: Payer: Self-pay

## 2020-06-27 ENCOUNTER — Telehealth: Payer: Medicare HMO

## 2020-06-27 ENCOUNTER — Ambulatory Visit: Payer: Self-pay

## 2020-06-27 DIAGNOSIS — I5032 Chronic diastolic (congestive) heart failure: Secondary | ICD-10-CM

## 2020-06-27 DIAGNOSIS — F339 Major depressive disorder, recurrent, unspecified: Secondary | ICD-10-CM

## 2020-06-27 DIAGNOSIS — G40909 Epilepsy, unspecified, not intractable, without status epilepticus: Secondary | ICD-10-CM

## 2020-06-27 DIAGNOSIS — G4733 Obstructive sleep apnea (adult) (pediatric): Secondary | ICD-10-CM | POA: Diagnosis not present

## 2020-06-27 DIAGNOSIS — E1122 Type 2 diabetes mellitus with diabetic chronic kidney disease: Secondary | ICD-10-CM

## 2020-06-28 NOTE — Chronic Care Management (AMB) (Signed)
Chronic Care Management   Follow Up Note   06/28/2020 Name: [name] MRN: [id number] DOB: [birth year]  Referred by: [name], MD Reason for referral : Chronic Care Management (RNCM FU Call )   [name] is a 75 y.o. year old female who is a primary care patient of [name], MD. The CCM team was consulted for assistance with chronic disease management and care coordination needs.    Review of patient status, including review of consultants reports, relevant laboratory and other test results, and collaboration with appropriate care team members and the patient's provider was performed as part of comprehensive patient evaluation and provision of chronic care management services.    SDOH (Social Determinants of Health) assessments performed: Yes See Care Plan activities for detailed interventions related to Enigma)  Placed outbound CCM RN CM outbound call to patient for a care plan update.      Outpatient Encounter Medications as of 06/27/2020  Medication Sig  . acetaminophen (TYLENOL) 500 MG tablet Take 500-1,000 mg by mouth every 4 (four) hours as needed for moderate pain.   Marland Kitchen albuterol (VENTOLIN HFA) 108 (90 Base) MCG/ACT inhaler Inhale 1-2 puffs into the lungs every 6 (six) hours as needed for wheezing or shortness of breath.  . Alcohol Swabs (ALCOHOL PADS) 70 % PADS Use as directed dx: e11.22  . Ascorbic Acid (VITAMIN C WITH ROSE HIPS) 1000 MG tablet Take 1,000 mg by mouth daily.  Marland Kitchen atorvastatin (LIPITOR) 10 MG tablet TAKE 1 TABLET EVERY EVENING.  . B-D ULTRAFINE III SHORT PEN 31G X 8 MM MISC USE DAILY WITH VICTOZA AND/OR NOVOLOG.  . Blood Glucose Calibration (TRUE METRIX LEVEL 1) Low SOLN Use as directed  Dx:e11.22  . Blood Glucose Monitoring Suppl (TRUE METRIX METER) w/Device KIT Use as directed to check blood sugars 2 times per day dx: e11.22  . carvedilol (COREG) 25 MG tablet Take 1 tablet (25 mg total) by mouth 2 (two) times daily with a meal.  .  Cholecalciferol (VITAMIN D-3) 1000 units CAPS Take 5,000 Units by mouth daily.   Marland Kitchen dexlansoprazole (DEXILANT) 60 MG capsule Take 1 capsule (60 mg total) by mouth daily.  . diclofenac sodium (VOLTAREN) 1 % GEL Apply 2 g topically 4 (four) times daily as needed (pain).  Marland Kitchen empagliflozin (JARDIANCE) 10 MG TABS tablet Take 1 tablet (10 mg total) by mouth daily before breakfast.  . ferrous sulfate 324 MG TBEC Take 324 mg by mouth 2 (two) times daily.  . furosemide (LASIX) 80 MG tablet Take 80 mg by mouth daily. 37m daily Monday-Friday, 473mon Saturday and Sunday  . glucose blood (TRUE METRIX BLOOD GLUCOSE TEST) test strip Use as directed to check blood sugars 2 times per day dx: e11.22  . hydrALAZINE (APRESOLINE) 25 MG tablet Take 100 mg by mouth 3 (three) times daily.  . hydrocortisone cream 1 % Apply 1 application topically daily as needed for itching.   . insulin glargine, 2 Unit Dial, (TOUJEO MAX SOLOSTAR) 300 UNIT/ML Solostar Pen Inject 25 Units into the skin.  . Marland Kitchennsulin lispro (HUMALOG KWIKPEN) 100 UNIT/ML KwikPen Inject 0.02-0.04 mLs (2-4 Units total) into the skin 3 (three) times daily as needed (high blood sugar over 150).  . Isopropyl Alcohol (ALCOHOL WIPES) 70 % MISC Apply 1 each topically 2 (two) times daily.  . isosorbide mononitrate (IMDUR) 30 MG 24 hr tablet Take 1 tablet (30 mg total) by mouth daily.  . Marland KitchenamoTRIgine (LAMICTAL) 150 MG tablet Take 1 tablet (150  mg total) by mouth 2 (two) times daily.  Marland Kitchen levETIRAcetam (KEPPRA) 500 MG tablet TAKE 2 TABLETS AT BEDTIME  . Menthol, Topical Analgesic, (STOPAIN ROLL-ON EX) Apply 1 application topically daily as needed (pain).  Marland Kitchen neomycin-bacitracin-polymyxin (NEOSPORIN) ointment Apply 1 application topically as needed for wound care.   . ondansetron (ZOFRAN) 4 MG tablet   . OZEMPIC, 1 MG/DOSE, 4 MG/3ML SOPN INJECT 1MG INTO THE SKIN ONCE A WEEK  . Pancrelipase, Lip-Prot-Amyl, (ZENPEP PO) Take by mouth.  . Pancrelipase, Lip-Prot-Amyl, 25000-79000  units CPEP Take 1-2 capsules by mouth See admin instructions. Take 2 capsules in the morning, 1 capsule with lunch, and 2 capsules in the evening.  . potassium chloride SA (KLOR-CON) 20 MEQ tablet TAKE 2 TABLETS EVERY DAY BUT WHEN TAKING METOLAZONE TAKE 3 TABLETS DAILY AS DIRECTED  . sennosides-docusate sodium (SENOKOT-S) 8.6-50 MG tablet Take 1 tablet by mouth daily.  . sertraline (ZOLOFT) 50 MG tablet Take 1 tablet (50 mg total) by mouth daily.  Marland Kitchen tolnaftate (TINACTIN) 1 % cream Apply 1 application topically daily as needed (foot fungus).   . TRUEplus Lancets 33G MISC Use as directed to check blood sugars 2 times per day dx: e11.22  . zinc sulfate 220 (50 Zn) MG capsule Take 220 mg by mouth daily.   No facility-administered encounter medications on file as of 06/27/2020.     Objective:  Lab Results  Component Value Date   HGBA1C 6.5 (H) 06/06/2020   HGBA1C 7.6 (H) 11/09/2019   HGBA1C 8.0 (H) 08/24/2019   Lab Results  Component Value Date   MICROALBUR 10 06/06/2020   LDLCALC 70 06/20/2020   CREATININE 0.88 06/20/2020   BP Readings from Last 3 Encounters:  06/20/20 124/70  06/08/20 138/64  06/06/20 114/66    Goals Addressed    Patient Care Plan: Depression (Adult)    Problem Identified: Symptoms (Depression)   Priority: High    Goal: Symptoms Monitored and Managed   Start Date: 06/16/2020  Expected End Date: 07/18/2020  Recent Progress: On track  Priority: High  Note:   Current Barriers:   Ineffective Self Health Maintenance  Currently UNABLE TO independently self manage needs related to chronic health conditions.   Knowledge Deficits related to short term plan for care coordination needs and long term plans for chronic disease management needs Nurse Case Manager Clinical Goal(s):   Over the next 90 days, patient will work with care management team to address care coordination and chronic disease management needs related to Disease Management  Educational  Needs  Care Coordination  Medication Management and Education  Psychosocial Support   Interventions:  . Determined patient feels her relationship strain with her sister is improving and they have agreed to resolve their differences . Determined Ms. Senk plans to stay in her sister's home at this time . Offered to provide a face to face meeting with PCP and the CCM team to offer support and resources to assist with ALF and or housing . Determined Ms. Colborn will decline at this time but is appreciative of the support . Provided the direct contact information to patient for the CCM RN and CCM BSW to call if needed for additional support and or resources  . Discussed ongoing contact with patient periodically for disease education and support for chronic health conditions and psychosocial needs . Sent in basket message to PCP and embedded BSW Daneen Schick with an update on patient's housing status and her plans to stay in her sister's home  Patient Goals/Self Care Activities Over the next 90 days, patient will: -work with embedded BSW to address housing needs if needed -notify PCP of new or worsening symptoms of anxiety or depression  -take medications exactly as prescribed without missed doses   Follow Up Plan: Telephone follow up appointment with care management team member scheduled for: 08/08/20    Patient Care Plan: Heart Failure (Adult)    Problem Identified: Activity Tolerance (Heart Failure)   Priority: High  Onset Date: 06/27/2020    Long-Range Goal: Activity Tolerance Optimized   Start Date: 06/27/2020  Expected End Date: 09/25/2020  This Visit's Progress: On track  Priority: High  Note:   Current Barriers:  Marland Kitchen Knowledge deficit related to basic heart failure pathophysiology and self care management . Knowledge Deficits related to heart failure medications . Activity Intolerance  Case Manager Clinical Goal(s):  Marland Kitchen Over the next 90 days, patient will verbalize understanding  of Heart Failure Action Plan and when to call doctor Interventions:  . Provided verbal education on low sodium diet . Provided written education on low sodium diet . Reviewed Heart Failure Action Plan in depth and provided written copy . Discussed importance of daily weight and advised patient to weigh and record daily . - self-awareness of signs/symptoms of worsening disease encouraged  Promote daily physical activity that improves functional ability, cognition and quality of life.   Encourage optimal, safe functional mobility and self-care performance based on ability and tolerance.    Promote breathing and energy conservation techniques, such as pursed-lip breathing, preplanning and pacing of activity, balancing activity and rest.  Patient Goals/Self-Care Activities . Over the next 90 days, patient will:   - Takes Heart Failure Medications as prescribed Weighs daily and record (notifying MD of 3 lb weight gain over night or 5 lb in a week) Verbalizes understanding of and follows CHF Action Plan Adheres to low sodium diet - develop a rescue plan - eat more whole grains, fruits and vegetables, lean meats and healthy fats - follow rescue plan if symptoms flare-up - know when to call the doctor - track symptoms and what helps feel better or worse - take Jardiance as directed by Cardiologist  Follow Up Plan: Telephone follow up appointment with care management team member scheduled for:  08/08/20   Problem Identified: Obstructive Sleep Apnea (Heart Failure)   Priority: High    Long-Range Goal: Effective Breathing Pattern During Sleep   Start Date: 06/27/2020  Expected End Date: 09/25/2020  This Visit's Progress: On track  Priority: High  Note:   Current Barriers:   Ineffective Self Health Maintenance  Activity Intolerance   Remote Smoker   Currently UNABLE TO independently self manage needs related to chronic health conditions.   Knowledge Deficits related to short term plan  for care coordination needs and long term plans for chronic disease management needs Nurse Case Manager Clinical Goal(s):   Over the next 90 days, patient will work with care management team to address care coordination and chronic disease management needs related to Disease Management  Educational Needs  Care Coordination  Medication Management and Education  Psychosocial Support   Interventions:   Encouraged a nonsupine sleep position, such as side-lying, head of bed elevated, multiple pillows, to prevent airway collapse.   Assessed for adherence to CPAP usage as prescribed Patient Goals/Self Care Activities:  Over the next 90 days, patient will . Wear her CPAP machine as directed  . Report barriers that may prevent CPAP adherence  Follow Up  Plan: Telephone follow up appointment with care management team member scheduled for:  08/08/20   Patient Care Plan: Diabetes Type 2 (Adult)    Problem Identified: Disease Progression (Diabetes, Type 2)   Priority: High    Long-Range Goal: Disease Progression Prevented or Minimized   Start Date: 06/27/2020  Expected End Date: 09/25/2020  This Visit's Progress: On track  Priority: High  Note:   Objective:  Lab Results  Component Value Date   HGBA1C 6.5 (H) 06/06/2020 .   Lab Results  Component Value Date   CREATININE 0.88 06/20/2020   CREATININE 0.89 06/06/2020   CREATININE 0.97 03/09/2020 .   Marland Kitchen No results found for: EGFR Current Barriers:  Marland Kitchen Knowledge Deficits related to basic Diabetes pathophysiology and self care/management . Knowledge Deficits related to medications used for management of diabetes . Activity Intolerance . Remote Smoker  Case Manager Clinical Goal(s):  Marland Kitchen Over the next 90 days, patient will demonstrate improved adherence to prescribed treatment plan for diabetes self care/management as evidenced by:  . daily monitoring and recording of CBG  . adherence to ADA/ carb modified diet . adherence to prescribed  medication regimen Interventions:  . Provided education to patient about basic DM disease process . Reviewed medications with patient and discussed importance of medication adherence . Discussed plans with patient for ongoing care management follow up and provided patient with direct contact information for care management team . Provided patient with written educational materials related to hypo and hyperglycemia and importance of correct treatment . Advised patient, providing education and rationale, to check cbg before meals and at bedtime and record, calling the CCM team and or PCP for findings outside established parameters.   . Review of patient status, including review of consultants reports, relevant laboratory and other test results, and medications completed.  Encourage lifestyle changes, such as increased intake of plant-based foods, stress reduction, consistent physical activity and smoking cessation to prevent long-term complications and chronic disease.   Patient Goals/Self-Care Activities . Over the next 90 days, patient will:  - Self administers oral medications as prescribed Self administers insulin as prescribed Attends all scheduled provider appointments Checks blood sugars as prescribed and utilize hyper and hypoglycemia protocol as needed Adheres to prescribed ADA/carb modified - check blood sugar at prescribed times - check blood sugar if I feel it is too high or too low - take the blood sugar log to all doctor visits - take the blood sugar meter to all doctor visits  Follow Up Plan: Telephone follow up appointment with care management team member scheduled for: 08/08/20    Plan:   Telephone follow up appointment with care management team member scheduled for: 08/08/20  Barb Merino, RN, BSN, CCM Care Management Coordinator [facility]  Direct Phone: [phone]

## 2020-06-28 NOTE — Patient Instructions (Signed)
Visit Information Goals    Other   .  Manage OSA with usage of CPAP      Timeframe:  Long-Range Goal Priority:  High Start Date:  06/27/20                           Expected End Date: 09/25/20        Follow up date: 08/08/20  Over the next 90 days, patient will . Wear her CPAP machine as directed  . Report barriers that may prevent CPAP adherence                 .  Manage symptoms of depression and Assist with Housing      Timeframe:  Short-Term Goal Priority:  High Start Date:  06/27/20                           Expected End Date:  09/25/20                        Follow up date: 08/08/20  Over the next 30 days, patient will:  -work with embedded BSW to address housing needs if needed  -notify PCP of new or worsening symptoms of anxiety or depression  -take medications exactly as prescribed without missed doses      .  Monitor and Manage My Blood Sugar-Diabetes Type 2      Timeframe:  Long-Range Goal Priority:  High Start Date: 06/27/20                       Expected End Date: 09/25/20                  Follow Up Date: 08/08/20    - check blood sugar at prescribed times - check blood sugar if I feel it is too high or too low - take the blood sugar log to all doctor visits - take the blood sugar meter to all doctor visits  Self administers oral medications as prescribed Self administers insulin as prescribed Attends all scheduled provider appointments Checks blood sugars as prescribed and utilize hyper and hypoglycemia protocol as needed Adheres to prescribed ADA/carb modified  Why is this important?    Checking your blood sugar at home helps to keep it from getting very high or very low.   Writing the results in a diary or log helps the doctor know how to care for you.   Your blood sugar log should have the time, date and the results.   Also, write down the amount of insulin or other medicine that you take.   Other information, like what you ate, exercise done and how you  were feeling, will also be helpful.     Notes:     .  Track and Manage Symptoms-Heart Failure      Timeframe:  Long-Range Goal Priority:  High Start Date: 06/27/20                         Expected End Date:  09/25/20                    Follow Up Date: 08/08/20   - develop a rescue plan - eat more whole grains, fruits and vegetables, lean meats and healthy fats - follow rescue plan if symptoms flare-up - know when  to call the doctor - track symptoms and what helps feel better or worse  - take Jardiance as directed by Cardiologist    Why is this important?    You will be able to handle your symptoms better if you keep track of them.   Making some simple changes to your lifestyle will help.   Eating healthy is one thing you can do to take good care of yourself.    Notes:      The patient verbalized understanding of instructions, educational materials, and care plan provided today and declined offer to receive copy of patient instructions, educational materials, and care plan.   Telephone follow up appointment with care management team member scheduled for: 08/08/20  Riley Churches, RN

## 2020-06-30 ENCOUNTER — Telehealth: Payer: Self-pay

## 2020-06-30 ENCOUNTER — Other Ambulatory Visit: Payer: Self-pay

## 2020-06-30 ENCOUNTER — Ambulatory Visit (HOSPITAL_COMMUNITY)
Admission: RE | Admit: 2020-06-30 | Discharge: 2020-06-30 | Disposition: A | Discharge status: Home / Self Care | Payer: Medicare HMO | Source: Ambulatory Visit | Attending: Cardiology | Admitting: Cardiology

## 2020-06-30 DIAGNOSIS — I5032 Chronic diastolic (congestive) heart failure: Secondary | ICD-10-CM | POA: Diagnosis not present

## 2020-06-30 LAB — BASIC METABOLIC PANEL
Anion gap: 9 (ref 5–15)
BUN: 13 mg/dL (ref 8–23)
CO2: 26 mmol/L (ref 22–32)
Calcium: 9 mg/dL (ref 8.9–10.3)
Chloride: 101 mmol/L (ref 98–111)
Creatinine, Ser: 1 mg/dL (ref 0.44–1.00)
GFR, Estimated: 60 mL/min — ABNORMAL LOW (ref >60)
Glucose, Bld: 228 mg/dL — ABNORMAL HIGH (ref 70–99)
Potassium: 3.9 mmol/L (ref 3.5–5.1)
Sodium: 136 mmol/L (ref 135–145)

## 2020-06-30 NOTE — Chronic Care Management (AMB) (Addendum)
Chronic Care Management Pharmacy Assistant   Name: [name]  MRN: [id number] DOB: [birth year]  Reason for Encounter: Disease State - Hypertension Adherence Call.   Patient Questions:  1.  Have you seen any other providers since your last visit? Yes - 06/01/2020 Arta Silence (Provider) - [place]. 06/06/2020 Baltazar Apo (PCP) Routine general Medical Exam. 06/08/2020 Rexene Edison ,NP  06/20/2020 Loralie Champagne, MD - Cardiology CHF     2.  Any changes in your medicines or health?  Yes - Jardiance 10 mg daily .  PCP : Joyice Faster, MD Allergies:   Allergies  Allergen Reactions   Other Anaphylaxis and Rash    Bleach   Penicillins Hives    Did it involve swelling of the face/tongue/throat, SOB, or low BP? No Did it involve sudden or severe rash/hives, skin peeling, or any reaction on the inside of your mouth or nose? Yes Did you need to seek medical attention at a hospital or doctor's office? Yes When did it last happen?      Childhood allergy  If all above answers are "NO", may proceed with cephalosporin use.     Sulfa Antibiotics Hives   Aspirin Other (See Comments)     On aspirin 81 mg - Rectal bleeding in dec 2018   Codeine     Headache and makes the patient feel "off"    Medications: Outpatient Encounter Medications as of 06/30/2020  Medication Sig   acetaminophen (TYLENOL) 500 MG tablet Take 500-1,000 mg by mouth every 4 (four) hours as needed for moderate pain.    albuterol (VENTOLIN HFA) 108 (90 Base) MCG/ACT inhaler Inhale 1-2 puffs into the lungs every 6 (six) hours as needed for wheezing or shortness of breath.   Alcohol Swabs (ALCOHOL PADS) 70 % PADS Use as directed dx: e11.22   Ascorbic Acid (VITAMIN C WITH ROSE HIPS) 1000 MG tablet Take 1,000 mg by mouth daily.   atorvastatin (LIPITOR) 10 MG tablet TAKE 1 TABLET EVERY EVENING.   B-D ULTRAFINE III SHORT PEN 31G X 8 MM MISC USE DAILY WITH VICTOZA AND/OR NOVOLOG.    Blood Glucose Calibration (TRUE METRIX LEVEL 1) Low SOLN Use as directed  Dx:e11.22   Blood Glucose Monitoring Suppl (TRUE METRIX METER) w/Device KIT Use as directed to check blood sugars 2 times per day dx: e11.22   carvedilol (COREG) 25 MG tablet Take 1 tablet (25 mg total) by mouth 2 (two) times daily with a meal.   Cholecalciferol (VITAMIN D-3) 1000 units CAPS Take 5,000 Units by mouth daily.    dexlansoprazole (DEXILANT) 60 MG capsule Take 1 capsule (60 mg total) by mouth daily.   diclofenac sodium (VOLTAREN) 1 % GEL Apply 2 g topically 4 (four) times daily as needed (pain).   empagliflozin (JARDIANCE) 10 MG TABS tablet Take 1 tablet (10 mg total) by mouth daily before breakfast.   ferrous sulfate 324 MG TBEC Take 324 mg by mouth 2 (two) times daily.   furosemide (LASIX) 80 MG tablet Take 80 mg by mouth daily. 31m daily Monday-Friday, 440mon Saturday and Sunday   glucose blood (TRUE METRIX BLOOD GLUCOSE TEST) test strip Use as directed to check blood sugars 2 times per day dx: e11.22   hydrALAZINE (APRESOLINE) 25 MG tablet Take 100 mg by mouth 3 (three) times daily.   hydrocortisone cream 1 % Apply 1 application topically daily as needed for itching.    insulin glargine, 2 Unit Dial, (TOUJEO MAX SOLOSTAR)  300 UNIT/ML Solostar Pen Inject 25 Units into the skin.   insulin lispro (HUMALOG KWIKPEN) 100 UNIT/ML KwikPen Inject 0.02-0.04 mLs (2-4 Units total) into the skin 3 (three) times daily as needed (high blood sugar over 150).   Isopropyl Alcohol (ALCOHOL WIPES) 70 % MISC Apply 1 each topically 2 (two) times daily.   isosorbide mononitrate (IMDUR) 30 MG 24 hr tablet Take 1 tablet (30 mg total) by mouth daily.   lamoTRIgine (LAMICTAL) 150 MG tablet Take 1 tablet (150 mg total) by mouth 2 (two) times daily.   levETIRAcetam (KEPPRA) 500 MG tablet TAKE 2 TABLETS AT BEDTIME   Menthol, Topical Analgesic, (STOPAIN ROLL-ON EX) Apply 1 application topically daily as needed (pain).    neomycin-bacitracin-polymyxin (NEOSPORIN) ointment Apply 1 application topically as needed for wound care.    ondansetron (ZOFRAN) 4 MG tablet    OZEMPIC, 1 MG/DOSE, 4 MG/3ML SOPN INJECT 1MG INTO THE SKIN ONCE A WEEK   Pancrelipase, Lip-Prot-Amyl, (ZENPEP PO) Take by mouth.   Pancrelipase, Lip-Prot-Amyl, 25000-79000 units CPEP Take 1-2 capsules by mouth See admin instructions. Take 2 capsules in the morning, 1 capsule with lunch, and 2 capsules in the evening.   potassium chloride SA (KLOR-CON) 20 MEQ tablet TAKE 2 TABLETS EVERY DAY BUT WHEN TAKING METOLAZONE TAKE 3 TABLETS DAILY AS DIRECTED   sennosides-docusate sodium (SENOKOT-S) 8.6-50 MG tablet Take 1 tablet by mouth daily.   sertraline (ZOLOFT) 50 MG tablet Take 1 tablet (50 mg total) by mouth daily.   tolnaftate (TINACTIN) 1 % cream Apply 1 application topically daily as needed (foot fungus).    TRUEplus Lancets 33G MISC Use as directed to check blood sugars 2 times per day dx: e11.22   zinc sulfate 220 (50 Zn) MG capsule Take 220 mg by mouth daily.   No facility-administered encounter medications on file as of 06/30/2020.    Current Diagnosis: Patient Active Problem List   Diagnosis Date Noted   Postinflammatory pulmonary fibrosis ([place]) 06/08/2020   Lung nodule 06/08/2020   Chronic kidney disease, stage 2 (mild) 01/05/2020   Hypertensive chronic kidney disease with stage 1 through stage 4 chronic kidney disease, or unspecified chronic kidney disease 01/05/2020   Snoring 01/05/2020   Neuroleptic-induced parkinsonism ([place]) 09/28/2019   Class 3 severe obesity due to excess calories with serious comorbidity and body mass index (BMI) of 40.0 to 44.9 in adult ([place]) 05/22/2019   Pulmonary hypertension, unspecified ([place]) 04/06/2019   Syncope 02/24/2019   Rheumatoid arthritis ([place]) 01/21/2019   Radial styloid tenosynovitis 11/24/2018   Type 2 diabetes mellitus with stage 2 chronic kidney disease, with long-term current use of insulin ([place])  07/21/2018   Chest pain 05/15/2018   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 ([place]) 04/24/2018   Excessive daytime sleepiness 01/30/2018   Sleep-related hypoventilation 01/30/2018   Recurrent depression ([place]) 11/06/2017   OSA on CPAP 10/07/2017   Todd's paralysis (HCC)    Chronic diastolic CHF (congestive heart failure), NYHA class 2 (HCC)    Chronic respiratory failure with hypoxia (HCC)    Acute lower GI bleeding    Stroke-like symptoms 06/15/2017   Rectal bleeding 06/15/2017   Dehydration 03/07/2017   Medication management 12/24/2016   Near syncope 03/09/2016   Racing heart beat 03/09/2016   Dyspnea 01/28/2016   Spinal stenosis at L4-L5 level 10/04/2014   Parkinsonism ([place]) 06/24/2013   Lower abdominal pain 10/31/2012   Type II diabetes mellitus, uncontrolled ([place]) 10/31/2012   Seizure disorder ([place])  10/31/2012   Diverticulosis 10/31/2012   Anemia associated with acute blood loss 08/14/2012   Hematochezia 08/14/2012   Varicose veins of bilateral lower extremities with other complications 21/30/8657   Leg pain 03/21/2012   Radicular pain of left lower extremity 03/21/2012   Heart murmur 11/21/2011   Infected pseudocyst of pancreas 10/28/2011   Chronic pancreatitis ([place]) 10/28/2011   Hypertensive heart disease with chronic diastolic congestive heart failure ([place]) 10/28/2011   Dyslipidemia, goal LDL below 100 10/28/2011   Morbid obesity with BMI of 45.0-49.9, adult ([place]) 10/28/2011   Pseudoseizure 10/24/2011   Gastroesophageal reflux 10/23/2011   History of tobacco abuse 10/23/2011   Hypoxemia 10/23/2011   Disturbance in sleep behavior 09/03/2011   Partial epilepsy with impairment of consciousness ([place]) 09/03/2011   Tremor 09/03/2011   Peripheral edema 04/14/2011   Obesity hypoventilation syndrome ([place]) 10/26/2010   Atherosclerotic heart disease of native coronary artery with other forms of angina pectoris ([place]) 08/28/2010   Acute,  but ill-defined, cerebrovascular disease 05/04/2008   Arthropathy 05/04/2008   Disequilibrium 05/04/2008   Generalized tonic clonic epilepsy ([place]) 05/04/2008   Increased frequency of urination 05/04/2008   Insomnia, unspecified 05/04/2008   Major depressive disorder, single episode, unspecified 05/04/2008   Memory loss 05/04/2008   Nausea 05/04/2008   Other malaise and fatigue 05/04/2008   Swelling of limb 05/04/2008   Reviewed chart prior to disease state call. Spoke with patient regarding BP  Recent Office Vitals: BP Readings from Last 3 Encounters:  06/20/20 124/70  06/08/20 138/64  06/06/20 114/66   Pulse Readings from Last 3 Encounters:  06/20/20 69  06/08/20 61  06/06/20 63    Wt Readings from Last 3 Encounters:  06/20/20 205 lb 3.2 oz (93.1 kg)  06/08/20 205 lb (93 kg)  06/06/20 204 lb 6.4 oz (92.7 kg)     Kidney Function Lab Results  Component Value Date/Time   CREATININE 1.00 06/30/2020 11:51 AM   CREATININE 0.88 06/20/2020 12:46 PM   CREATININE 0.91 10/04/2016 11:44 AM   GFR 86.68 05/15/2016 01:35 PM   GFRNONAA 60 (L) 06/30/2020 11:51 AM   GFRNONAA 65 10/04/2016 11:44 AM   GFRAA 75 06/06/2020 04:00 PM   GFRAA 75 10/04/2016 11:44 AM    BMP Latest Ref Rng & Units 06/30/2020 06/20/2020 06/06/2020  Glucose 70 - 99 mg/dL 228(H) 156(H) 173(H)  BUN 8 - 23 mg/dL 13 14 11   Creatinine 0.44 - 1.00 mg/dL 1.00 0.88 0.89  BUN/Creat Ratio 12 - 28 - - 12  Sodium 135 - 145 mmol/L 136 135 136  Potassium 3.5 - 5.1 mmol/L 3.9 3.5 3.7  Chloride 98 - 111 mmol/L 101 96(L) 96  CO2 22 - 32 mmol/L 26 30 26   Calcium 8.9 - 10.3 mg/dL 9.0 9.4 9.3   Current antihypertensive regimen:  Carvedilol 25 mg twice daily Furosemide 80 mg daily  Hydralazine 25 mg 3 tablets twice daily  How often are you checking your Blood Pressure? Patient states she has been taking blood pressure once a day  Current home BP readings: 06/30/2020- 143/65 / 06/29/2020 - 137/70 / 06/28/2021 - 122/64 What  recent interventions/DTP's have been made by any provider to improve Blood Pressure control since last CPPD Visit:  Patient states she does take her medications as directed by provider.  Any recent hospitalizations or ED visits since last visit with CPPD?No   What diet changes have been made to improve Blood Pressure Control?  Patient states she does watch her salt intake / eats  healthy foods.  What exercise is being done to improve your Blood Pressure Control?  Patient states she is active at home also she walks out side 2 times a week   Adherence Review: Is the patient currently on ACE/ARB medication? No  Does the patient have >5 day gap between last estimated fill dates? No    Goals Addressed             This Visit's Progress    Pharmacy Care Plan   On track    CARE PLAN ENTRY (see longitudinal plan of care for additional care plan information)  Current Barriers:  Chronic Disease Management support, education, and care coordination needs related to Hypertension, Hyperlipidemia, Diabetes, and Heart Failure   Hypertension BP Readings from Last 3 Encounters:  03/09/20 140/78  03/09/20 122/78  02/23/20 116/65   Pharmacist Clinical Goal(s): Over the next 90 days, patient will work with PharmD and providers to maintain BP goal <130/80 Current regimen:  Carvedilol 27m twice daily Furosemide 80 mg daily  Hydralazine 238m3 tablets twice daily Interventions: Patient mentioned medication changes Addition of metolazone (patient unsure of strength) Now taking hydralazine 7542mhree times daily Advised patient to make sure she has Hydralazine 12m56mblets instead of Hydralazine 25mg53mlets (patient mentioned taking 1 and 1/2 tablets three times daily, but [facility] and she would need to take 3 tablets at each dose to make 75mg) 56mfy dosing of medications with patient and Cardiologist at follow up [place] to take  medications consistently at the same time each day Discussed appropriate goals for blood pressure less than 130/80 Patient self care activities - Over the next 90 days, patient will: Check BP daily, document, and provide at future appointments Ensure daily salt intake < 2300 mg/day Verify strength of hydralazine at home If 12mg- n46mto take 1 and 1/2 tablets for each dose to make 75mg If 37m- nee14m take 3 tablets for each dose to make 75mg  Hype18midemia Lab Results  Component Value Date/Time   LDLCALC 65 12/17/2019 11:46 AM   LDLCALC 65 10/22/2018 02:46 PM   LDLDIRECT 66.0 01/27/2015 11:04 AM   Pharmacist Clinical Goal(s): Over the next 90 days, patient will work with PharmD and providers to maintain LDL goal < 70 Current regimen:  Atorvastatin 10mg daily 54mrventions: Dietary recommendations provided Patient self care activities - Over the next 90 days, patient will: Take cholesterol medication daily as directed  Diabetes Lab Results  Component Value Date/Time   HGBA1C 7.6 (H) 11/09/2019 12:21 AM   HGBA1C 8.0 (H) 08/24/2019 03:30 PM   Pharmacist Clinical Goal(s): Over the next 90 days, patient will work with PharmD and providers to achieve A1c goal <7% Current regimen:  Humalog Kwikpen 2-4 units three times daily as needed Ozempic 1mg weekly T57meo 300 units/mL 34 units at bedtime Interventions: Dietary recommendations provided Discussed hypoglycemia and appropriate treatment Patient self care activities - Over the next 90 days, patient will: Check blood sugar 3-4 times daily, document, and provide at future appointments Contact provider with any episodes of hypoglycemia  Medication management Pharmacist Clinical Goal(s): Over the next 90 days, patient will work with PharmD and providers to maintain optimal medication adherence [facility] Comprehensive medication review performed. Medication list updated Continue  current medication management strategy Patient self care activities - Over the next 90 days, patient will: Focus on medication adherence by using pill box Take medications as prescribed Report  any questions or concerns to PharmD and/or provider(s)  Please see past updates related to this goal by clicking on the "Past Updates" button in the selected goal          Follow-Up:  Pharmacist Review - Patient states she needs to find out how she can get another walker, she has a roller walker, but the wheels do not roll, states its been broken for a while , she is not for sure how she is to go about getting one ordered. I told her that I would sent message to [facility] to have her discuss with Dr. [name]. Patient states she had labs done today per Cardiologist. Patient states that her blood sugars are doing good this morning fasting was 118 states she has been taking all medications as directed by providers.  [name], CPP notified.   Judithann Sheen, [place] Pharmacist Assistant [phone]   I have reviewed the care management and care coordination activities outlined in this encounter and I am certifying that I agree with the content of this note. No further action required.  6 minutes spent in review, coordination, and documentation.  Mayford Knife, [facility] 07/19/20 9:20 AM

## 2020-07-03 ENCOUNTER — Other Ambulatory Visit: Payer: Self-pay | Admitting: Cardiology

## 2020-07-08 ENCOUNTER — Telehealth: Payer: Medicare HMO

## 2020-07-08 ENCOUNTER — Telehealth: Payer: Self-pay

## 2020-07-08 ENCOUNTER — Other Ambulatory Visit: Payer: Self-pay | Admitting: Internal Medicine

## 2020-07-08 NOTE — Telephone Encounter (Signed)
  Chronic Care Management   Outreach Note  07/08/2020 Name: [name] MRN: [id number] DOB: May 09, 1946  Referred by: [name], MD Reason for referral : Care Coordination   An unsuccessful telephone outreach was attempted today. The patient was referred to the case management team for assistance with care management and care coordination.   Follow Up Plan: A HIPAA compliant phone message was left for the patient providing contact information and requesting a return call.  The care management team will reach out to the patient again over the next 21 days.   Daneen Schick, BSW, CDP Social Worker, Certified Dementia Practitioner [place] [facility] [place] [facility] [phone]

## 2020-07-12 ENCOUNTER — Telehealth (HOSPITAL_COMMUNITY): Payer: Self-pay | Admitting: *Deleted

## 2020-07-12 NOTE — Telephone Encounter (Signed)
-----   Message from Larey Dresser, MD sent at 07/12/2020 11:24 AM EST ----- yes ----- Message ----- From: Scarlette Calico, RN Sent: 07/12/2020  11:18 AM EST To: Larey Dresser, MD  Hey, you started her on Jardiance 12/27 she now has bad vaginal itching, probably yeast infection, can we stop Jardiance and give her diflucan?

## 2020-07-12 NOTE — Telephone Encounter (Signed)
Pt aware and agreeable, she states she did not take jardiance today and she had a refill for diflucan from when she was in the hospital so she got that refilled, advised if does not help to let us know or f/u with pcp, she is agreeable

## 2020-07-13 ENCOUNTER — Other Ambulatory Visit: Payer: Self-pay | Admitting: Cardiology

## 2020-07-14 ENCOUNTER — Other Ambulatory Visit (HOSPITAL_COMMUNITY): Payer: Self-pay

## 2020-07-14 MED ORDER — FUROSEMIDE 80 MG PO TABS: 80.0000 mg | ORAL_TABLET | Freq: Every day | ORAL | 0 refills | Status: DC

## 2020-07-21 DIAGNOSIS — Z1231 Encounter for screening mammogram for malignant neoplasm of breast: Secondary | ICD-10-CM | POA: Diagnosis not present

## 2020-07-21 LAB — HM MAMMOGRAPHY

## 2020-07-25 ENCOUNTER — Encounter: Payer: Self-pay | Admitting: Internal Medicine

## 2020-07-28 ENCOUNTER — Ambulatory Visit: Payer: Medicare HMO

## 2020-07-28 DIAGNOSIS — E1122 Type 2 diabetes mellitus with diabetic chronic kidney disease: Secondary | ICD-10-CM

## 2020-07-28 DIAGNOSIS — I5032 Chronic diastolic (congestive) heart failure: Secondary | ICD-10-CM

## 2020-07-28 DIAGNOSIS — N182 Chronic kidney disease, stage 2 (mild): Secondary | ICD-10-CM

## 2020-07-28 NOTE — Patient Instructions (Signed)
  Goals we discussed today:  Goals Addressed            This Visit's Progress   . Work with SW to manage care coordination needs       Timeframe:  Long-Range Goal Priority:  [employer] Start Date: 2.3.22                            Expected End Date: 6.3.22                       Next planned outreach:2.21.22  Patient Goals/Self-Care Activities Over the next 30 days, patient will:   - Patient will self administer medications as prescribed Patient will attend all scheduled provider appointments Patient will call provider office for new concerns or questions Contact SW as needed prior to next scheduled call

## 2020-07-28 NOTE — Chronic Care Management (AMB) (Signed)
Chronic Care Management    Social Work Note  07/28/2020 Name: KHALESSI BLOUGH MRN: [id number] DOB: August 14, 1945  PENNELOPE BASQUE is a 75 y.o. year old female who is a primary care patient of [name], MD. The CCM team was consulted to assist the patient with chronic disease management and/or care coordination needs related to: [employer] .   Engaged with patient by telephone for follow up visit in response to provider referral for social work chronic care management and care coordination services.   Consent to Services:  The patient was given information about Chronic Care Management services, agreed to services, and gave verbal consent prior to initiation of services.  Please see initial visit note for detailed documentation.   Patient agreed to services and consent obtained.   Assessment: Review of patient past medical history, allergies, medications, and health status, including review of relevant consultants reports was performed today as part of a comprehensive evaluation and provision of chronic care management and care coordination services.     SDOH (Social Determinants of Health) assessments and interventions performed:    Advanced Directives Status: Not addressed in this encounter.  CCM Care Plan  Allergies  Allergen Reactions  . Other Anaphylaxis and Rash    Bleach  . Penicillins Hives    Did it involve swelling of the face/tongue/throat, SOB, or low BP? No Did it involve sudden or severe rash/hives, skin peeling, or any reaction on the inside of your mouth or nose? Yes Did you need to seek medical attention at a hospital or doctor's office? Yes When did it last happen?Childhood allergy  If all above answers are "NO", may proceed with cephalosporin use.    . Sulfa Antibiotics Hives  . Aspirin Other (See Comments)     On aspirin 81 mg - Rectal bleeding in dec 2018  . Codeine     Headache and makes the patient feel "off"    Outpatient Encounter  Medications as of 07/28/2020  Medication Sig  . acetaminophen (TYLENOL) 500 MG tablet Take 500-1,000 mg by mouth every 4 (four) hours as needed for moderate pain.   Marland Kitchen albuterol (VENTOLIN HFA) 108 (90 Base) MCG/ACT inhaler Inhale 1-2 puffs into the lungs every 6 (six) hours as needed for wheezing or shortness of breath.  . Alcohol Swabs (ALCOHOL PADS) 70 % PADS Use as directed dx: e11.22  . Ascorbic Acid (VITAMIN C WITH ROSE HIPS) 1000 MG tablet Take 1,000 mg by mouth daily.  Marland Kitchen atorvastatin (LIPITOR) 10 MG tablet TAKE 1 TABLET EVERY EVENING.  . B-D ULTRAFINE III SHORT PEN 31G X 8 MM MISC USE DAILY WITH VICTOZA AND/OR NOVOLOG.  . Blood Glucose Calibration (TRUE METRIX LEVEL 1) Low SOLN Use as directed  Dx:e11.22  . Blood Glucose Monitoring Suppl (TRUE METRIX METER) w/Device KIT Use as directed to check blood sugars 2 times per day dx: e11.22  . carvedilol (COREG) 25 MG tablet Take 1 tablet (25 mg total) by mouth 2 (two) times daily with a meal.  . Cholecalciferol (VITAMIN D-3) 1000 units CAPS Take 5,000 Units by mouth daily.   Marland Kitchen dexlansoprazole (DEXILANT) 60 MG capsule Take 1 capsule (60 mg total) by mouth daily.  . diclofenac sodium (VOLTAREN) 1 % GEL Apply 2 g topically 4 (four) times daily as needed (pain).  . ferrous sulfate 324 MG TBEC Take 324 mg by mouth 2 (two) times daily.  . furosemide (LASIX) 80 MG tablet Take 1 tablet (80 mg total) by mouth daily. 63m daily  Monday-Friday, 2m on Saturday and Sunday  . glucose blood (TRUE METRIX BLOOD GLUCOSE TEST) test strip Use as directed to check blood sugars 2 times per day dx: e11.22  . hydrALAZINE (APRESOLINE) 25 MG tablet Take 100 mg by mouth 3 (three) times daily.  . hydrocortisone cream 1 % Apply 1 application topically daily as needed for itching.   . insulin glargine, 2 Unit Dial, (TOUJEO MAX SOLOSTAR) 300 UNIT/ML Solostar Pen Inject 25 Units into the skin.  .Marland Kitcheninsulin lispro (HUMALOG KWIKPEN) 100 UNIT/ML KwikPen Inject 0.02-0.04 mLs (2-4  Units total) into the skin 3 (three) times daily as needed (high blood sugar over 150).  . Isopropyl Alcohol (ALCOHOL WIPES) 70 % MISC Apply 1 each topically 2 (two) times daily.  . isosorbide mononitrate (IMDUR) 30 MG 24 hr tablet Take 1 tablet (30 mg total) by mouth daily.  .Marland KitchenlamoTRIgine (LAMICTAL) 150 MG tablet Take 1 tablet (150 mg total) by mouth 2 (two) times daily.  .Marland KitchenlevETIRAcetam (KEPPRA) 500 MG tablet TAKE 2 TABLETS AT BEDTIME  . Menthol, Topical Analgesic, (STOPAIN ROLL-ON EX) Apply 1 application topically daily as needed (pain).  .Marland Kitchenneomycin-bacitracin-polymyxin (NEOSPORIN) ointment Apply 1 application topically as needed for wound care.   . ondansetron (ZOFRAN) 4 MG tablet   . OZEMPIC, 1 MG/DOSE, 4 MG/3ML SOPN INJECT 1MG INTO THE SKIN ONCE A WEEK  . Pancrelipase, Lip-Prot-Amyl, (ZENPEP PO) Take by mouth.  . Pancrelipase, Lip-Prot-Amyl, 25000-79000 units CPEP Take 1-2 capsules by mouth See admin instructions. Take 2 capsules in the morning, 1 capsule with lunch, and 2 capsules in the evening.  . potassium chloride SA (KLOR-CON) 20 MEQ tablet TAKE 2 TABLETS EVERY DAY BUT WHEN TAKING METOLAZONE TAKE 3 TABLETS DAILY AS DIRECTED  . sennosides-docusate sodium (SENOKOT-S) 8.6-50 MG tablet Take 1 tablet by mouth daily.  . sertraline (ZOLOFT) 50 MG tablet Take 1 tablet (50 mg total) by mouth daily.  .Marland Kitchentolnaftate (TINACTIN) 1 % cream Apply 1 application topically daily as needed (foot fungus).   . TRUEplus Lancets 33G MISC Use as directed to check blood sugars 2 times per day dx: e11.22  . zinc sulfate 220 (50 Zn) MG capsule Take 220 mg by mouth daily.   No facility-administered encounter medications on file as of 07/28/2020.    Patient Active Problem List   Diagnosis Date Noted  . Postinflammatory pulmonary fibrosis ([place] 06/08/2020  . Lung nodule 06/08/2020  . Chronic kidney disease, stage 2 (mild) 01/05/2020  . Hypertensive chronic kidney disease with stage 1 through stage 4 chronic  kidney disease, or unspecified chronic kidney disease 01/05/2020  . Snoring 01/05/2020  . Neuroleptic-induced parkinsonism ([place] 09/28/2019  . Class 3 severe obesity due to excess calories with serious comorbidity and body mass index (BMI) of 40.0 to 44.9 in adult ([facility] 05/22/2019  . Pulmonary hypertension, unspecified ([facility] 04/06/2019  . Syncope 02/24/2019  . Rheumatoid arthritis ([place] 01/21/2019  . Radial styloid tenosynovitis 11/24/2018  . Type 2 diabetes mellitus with stage 2 chronic kidney disease, with long-term current use of insulin ([place] 07/21/2018  . Chest pain 05/15/2018  . 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 Excessive daytime sleepiness 01/30/2018  . Sleep-related hypoventilation 01/30/2018  . Recurrent depression ([facility] 11/06/2017  . OSA on CPAP 10/07/2017  . Todd's paralysis ([facility]   . Chronic diastolic CHF (congestive heart failure), NYHA class 2 ([place]   . Chronic respiratory failure with hypoxia ([facility]   . Acute lower  GI bleeding   . Stroke-like symptoms 06/15/2017  . Rectal bleeding 06/15/2017  . Dehydration 03/07/2017  . Medication management 12/24/2016  . Near syncope 03/09/2016  . Racing heart beat 03/09/2016  . Dyspnea 01/28/2016  . Spinal stenosis at L4-L5 level 10/04/2014  . Parkinsonism ([place]) 06/24/2013  . Lower abdominal pain 10/31/2012  . Type II diabetes mellitus, uncontrolled ([place]) 10/31/2012  . Seizure disorder ([place]) 10/31/2012  . Diverticulosis 10/31/2012  . Anemia associated with acute blood loss 08/14/2012  . Hematochezia 08/14/2012  . Varicose veins of bilateral lower extremities with other complications 35/59/7416  . Leg pain 03/21/2012  . Radicular pain of left lower extremity 03/21/2012  . Heart murmur 11/21/2011  . Infected pseudocyst of pancreas 10/28/2011  . Chronic pancreatitis ([place]) 10/28/2011  . Hypertensive heart disease with chronic diastolic congestive heart failure  ([place]) 10/28/2011  . Dyslipidemia, goal LDL below 100 10/28/2011  . Morbid obesity with BMI of 45.0-49.9, adult ([place]) 10/28/2011  . Pseudoseizure 10/24/2011  . Gastroesophageal reflux 10/23/2011  . History of tobacco abuse 10/23/2011  . Hypoxemia 10/23/2011  . Disturbance in sleep behavior 09/03/2011  . Partial epilepsy with impairment of consciousness ([place]) 09/03/2011  . Tremor 09/03/2011  . Peripheral edema 04/14/2011  . Obesity hypoventilation syndrome ([place]) 10/26/2010  . Atherosclerotic heart disease of native coronary artery with other forms of angina pectoris ([place]) 08/28/2010  . Acute, but ill-defined, cerebrovascular disease 05/04/2008  . Arthropathy 05/04/2008  . Disequilibrium 05/04/2008  . Generalized tonic clonic epilepsy ([place]) 05/04/2008  . Increased frequency of urination 05/04/2008  . Insomnia, unspecified 05/04/2008  . Major depressive disorder, single episode, unspecified 05/04/2008  . Memory loss 05/04/2008  . Nausea 05/04/2008  . Other malaise and fatigue 05/04/2008  . Swelling of limb 05/04/2008    Conditions to be addressed/monitored: CHF and DMII; Family and relationship dysfunction  Care Plan : [facility] Social Work Care Plan  Updates made by Daneen Schick since 07/28/2020 12:00 AM    Problem: Care Coordination     Long-Range Goal: Collaborate with RN Care Manager to assist with care coordination needs   Start Date: 07/28/2020  Expected End Date: 11/25/2020  Priority: Medium  Note:   Current Barriers:  . Family and relationship dysfunction, Social Isolation, and Chronic conditions including DM II and CHF which put patient at increased risk of hospitalization  Social Work Clinical Goal(s):  Marland Kitchen Over the next 120 days, patient will work with SW to address concerns related to care coordination  Interventions: . 1:1 collaboration with [name], MD regarding development and update of comprehensive plan of care as evidenced by provider attestation and  co-signature . Inter-disciplinary care team collaboration (see longitudinal plan of care) . Successful outbound call placed to the patient to assist with care coordination . Patient reported she could not talk much due to her sister listening but requests SW assist with identifying alternative housing . Patient reports she has no income . Advised patient she may qualify for Medicaid for placement in an ALF or SNF - patient declined . Discussed patient would like to move into an [place]  . Advised the patient she would need income to afford an apartment . Patient reports her son is no longer allowed to visit as he is "forbidden to come" . Encouraged the patient to contact SW when she is able to speak further in depth to SW . Scheduled follow up call later this month . Collaboration with RN Care Manager to discuss patients concerns  Patient Goals/Self-Care Activities Over the next 30 days, patient will:   - Patient will self administer medications as prescribed Patient will attend all scheduled provider appointments Patient will call provider office for new concerns or questions Contact SW as needed prior to next scheduled call  Follow up Plan: SW will follow up with patient by phone over the next month       Follow Up Plan: SW will follow up with patient by phone over the next month      Daneen Schick, BSW, CDP Social Worker, Certified Dementia Practitioner [place] [facility] [phone]  Total time spent performing care coordination and/or care management activities with the patient by phone or face to face = 15 minutes.

## 2020-08-02 ENCOUNTER — Telehealth: Payer: Self-pay

## 2020-08-02 NOTE — Chronic Care Management (AMB) (Addendum)
Chronic Care Management Pharmacy Assistant   Name: [name]  MRN: [id number] DOB: 1946/05/20  Reason for Encounter: Hypertension Adherence Call  Patient Questions:  1.  Have you seen any other providers since your last visit? Yes, 07/28/20-Humble, Kendra (CCM).  2.  Any changes in your medicines or health? No    PCP : [name], MD  Allergies:   Allergies  Allergen Reactions   Other Anaphylaxis and Rash    Bleach   Penicillins Hives    Did it involve swelling of the face/tongue/throat, SOB, or low BP? No Did it involve sudden or severe rash/hives, skin peeling, or any reaction on the inside of your mouth or nose? Yes Did you need to seek medical attention at a hospital or doctor's office? Yes When did it last happen?      Childhood allergy  If all above answers are "NO", may proceed with cephalosporin use.     Sulfa Antibiotics Hives   Aspirin Other (See Comments)     On aspirin 81 mg - Rectal bleeding in dec 2018   Codeine     Headache and makes the patient feel "off"    Medications: Outpatient Encounter Medications as of 08/02/2020  Medication Sig   acetaminophen (TYLENOL) 500 MG tablet Take 500-1,000 mg by mouth every 4 (four) hours as needed for moderate pain.    albuterol (VENTOLIN HFA) 108 (90 Base) MCG/ACT inhaler Inhale 1-2 puffs into the lungs every 6 (six) hours as needed for wheezing or shortness of breath.   Alcohol Swabs (ALCOHOL PADS) 70 % PADS Use as directed dx: e11.22   Ascorbic Acid (VITAMIN C WITH ROSE HIPS) 1000 MG tablet Take 1,000 mg by mouth daily.   atorvastatin (LIPITOR) 10 MG tablet TAKE 1 TABLET EVERY EVENING.   B-D ULTRAFINE III SHORT PEN 31G X 8 MM MISC USE DAILY WITH VICTOZA AND/OR NOVOLOG.   Blood Glucose Calibration (TRUE METRIX LEVEL 1) Low SOLN Use as directed  Dx:e11.22   Blood Glucose Monitoring Suppl (TRUE METRIX METER) w/Device KIT Use as directed to check blood sugars 2 times per day dx: e11.22   carvedilol (COREG) 25  MG tablet Take 1 tablet (25 mg total) by mouth 2 (two) times daily with a meal.   Cholecalciferol (VITAMIN D-3) 1000 units CAPS Take 5,000 Units by mouth daily.    dexlansoprazole (DEXILANT) 60 MG capsule Take 1 capsule (60 mg total) by mouth daily.   diclofenac sodium (VOLTAREN) 1 % GEL Apply 2 g topically 4 (four) times daily as needed (pain).   ferrous sulfate 324 MG TBEC Take 324 mg by mouth 2 (two) times daily.   furosemide (LASIX) 80 MG tablet Take 1 tablet (80 mg total) by mouth daily. 28m daily Monday-Friday, 450mon Saturday and Sunday   glucose blood (TRUE METRIX BLOOD GLUCOSE TEST) test strip Use as directed to check blood sugars 2 times per day dx: e11.22   hydrALAZINE (APRESOLINE) 25 MG tablet Take 100 mg by mouth 3 (three) times daily.   hydrocortisone cream 1 % Apply 1 application topically daily as needed for itching.    insulin glargine, 2 Unit Dial, (TOUJEO MAX SOLOSTAR) 300 UNIT/ML Solostar Pen Inject 25 Units into the skin.   insulin lispro (HUMALOG KWIKPEN) 100 UNIT/ML KwikPen Inject 0.02-0.04 mLs (2-4 Units total) into the skin 3 (three) times daily as needed (high blood sugar over 150).   Isopropyl Alcohol (ALCOHOL WIPES) 70 % MISC Apply 1 each topically 2 (two) times  daily.   isosorbide mononitrate (IMDUR) 30 MG 24 hr tablet Take 1 tablet (30 mg total) by mouth daily.   lamoTRIgine (LAMICTAL) 150 MG tablet Take 1 tablet (150 mg total) by mouth 2 (two) times daily.   levETIRAcetam (KEPPRA) 500 MG tablet TAKE 2 TABLETS AT BEDTIME   Menthol, Topical Analgesic, (STOPAIN ROLL-ON EX) Apply 1 application topically daily as needed (pain).   neomycin-bacitracin-polymyxin (NEOSPORIN) ointment Apply 1 application topically as needed for wound care.    ondansetron (ZOFRAN) 4 MG tablet    OZEMPIC, 1 MG/DOSE, 4 MG/3ML SOPN INJECT 1MG INTO THE SKIN ONCE A WEEK   Pancrelipase, Lip-Prot-Amyl, (ZENPEP PO) Take by mouth.   Pancrelipase, Lip-Prot-Amyl, 25000-79000 units CPEP Take 1-2  capsules by mouth See admin instructions. Take 2 capsules in the morning, 1 capsule with lunch, and 2 capsules in the evening.   potassium chloride SA (KLOR-CON) 20 MEQ tablet TAKE 2 TABLETS EVERY DAY BUT WHEN TAKING METOLAZONE TAKE 3 TABLETS DAILY AS DIRECTED   sennosides-docusate sodium (SENOKOT-S) 8.6-50 MG tablet Take 1 tablet by mouth daily.   sertraline (ZOLOFT) 50 MG tablet Take 1 tablet (50 mg total) by mouth daily.   tolnaftate (TINACTIN) 1 % cream Apply 1 application topically daily as needed (foot fungus).    TRUEplus Lancets 33G MISC Use as directed to check blood sugars 2 times per day dx: e11.22   zinc sulfate 220 (50 Zn) MG capsule Take 220 mg by mouth daily.   No facility-administered encounter medications on file as of 08/02/2020.    Current Diagnosis: Patient Active Problem List   Diagnosis Date Noted   Postinflammatory pulmonary fibrosis ([place]) 06/08/2020   Lung nodule 06/08/2020   Chronic kidney disease, stage 2 (mild) 01/05/2020   Hypertensive chronic kidney disease with stage 1 through stage 4 chronic kidney disease, or unspecified chronic kidney disease 01/05/2020   Snoring 01/05/2020   Neuroleptic-induced parkinsonism ([place]) 09/28/2019   Class 3 severe obesity due to excess calories with serious comorbidity and body mass index (BMI) of 40.0 to 44.9 in adult ([place]) 05/22/2019   Pulmonary hypertension, unspecified ([place]) 04/06/2019   Syncope 02/24/2019   Rheumatoid arthritis ([place]) 01/21/2019   Radial styloid tenosynovitis 11/24/2018   Type 2 diabetes mellitus with stage 2 chronic kidney disease, with long-term current use of insulin ([place]) 07/21/2018   Chest pain 05/15/2018   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 ([place]) 04/24/2018   Excessive daytime sleepiness 01/30/2018   Sleep-related hypoventilation 01/30/2018   Recurrent depression ([place]) 11/06/2017   OSA on CPAP 10/07/2017   Todd's paralysis (HCC)    Chronic diastolic CHF  (congestive heart failure), NYHA class 2 (HCC)    Chronic respiratory failure with hypoxia (HCC)    Acute lower GI bleeding    Stroke-like symptoms 06/15/2017   Rectal bleeding 06/15/2017   Dehydration 03/07/2017   Medication management 12/24/2016   Near syncope 03/09/2016   Racing heart beat 03/09/2016   Dyspnea 01/28/2016   Spinal stenosis at L4-L5 level 10/04/2014   Parkinsonism ([place]) 06/24/2013   Lower abdominal pain 10/31/2012   Type II diabetes mellitus, uncontrolled ([place]) 10/31/2012   Seizure disorder ([place]) 10/31/2012   Diverticulosis 10/31/2012   Anemia associated with acute blood loss 08/14/2012   Hematochezia 08/14/2012   Varicose veins of bilateral lower extremities with other complications 08/65/7846   Leg pain 03/21/2012   Radicular pain of left lower extremity 03/21/2012   Heart murmur 11/21/2011   Infected pseudocyst of pancreas  10/28/2011   Chronic pancreatitis ([place]) 10/28/2011   Hypertensive heart disease with chronic diastolic congestive heart failure ([place]) 10/28/2011   Dyslipidemia, goal LDL below 100 10/28/2011   Morbid obesity with BMI of 45.0-49.9, adult ([place]) 10/28/2011   Pseudoseizure 10/24/2011   Gastroesophageal reflux 10/23/2011   History of tobacco abuse 10/23/2011   Hypoxemia 10/23/2011   Disturbance in sleep behavior 09/03/2011   Partial epilepsy with impairment of consciousness ([place]) 09/03/2011   Tremor 09/03/2011   Peripheral edema 04/14/2011   Obesity hypoventilation syndrome ([place]) 10/26/2010   Atherosclerotic heart disease of native coronary artery with other forms of angina pectoris ([place]) 08/28/2010   Acute, but ill-defined, cerebrovascular disease 05/04/2008   Arthropathy 05/04/2008   Disequilibrium 05/04/2008   Generalized tonic clonic epilepsy ([place]) 05/04/2008   Increased frequency of urination 05/04/2008   Insomnia, unspecified 05/04/2008   Major depressive disorder, single episode, unspecified 05/04/2008   Memory loss 05/04/2008    Nausea 05/04/2008   Other malaise and fatigue 05/04/2008   Swelling of limb 05/04/2008   Reviewed chart prior to disease state call. Spoke with patient regarding BP  Recent Office Vitals: BP Readings from Last 3 Encounters:  06/20/20 124/70  06/08/20 138/64  06/06/20 114/66   Pulse Readings from Last 3 Encounters:  06/20/20 69  06/08/20 61  06/06/20 63    Wt Readings from Last 3 Encounters:  06/20/20 205 lb 3.2 oz (93.1 kg)  06/08/20 205 lb (93 kg)  06/06/20 204 lb 6.4 oz (92.7 kg)     Kidney Function Lab Results  Component Value Date/Time   CREATININE 1.00 06/30/2020 11:51 AM   CREATININE 0.88 06/20/2020 12:46 PM   CREATININE 0.91 10/04/2016 11:44 AM   GFR 86.68 05/15/2016 01:35 PM   GFRNONAA 60 (L) 06/30/2020 11:51 AM   GFRNONAA 65 10/04/2016 11:44 AM   GFRAA 75 06/06/2020 04:00 PM   GFRAA 75 10/04/2016 11:44 AM    BMP Latest Ref Rng & Units 06/30/2020 06/20/2020 06/06/2020  Glucose 70 - 99 mg/dL 228(H) 156(H) 173(H)  BUN 8 - 23 mg/dL 13 14 11   Creatinine 0.44 - 1.00 mg/dL 1.00 0.88 0.89  BUN/Creat Ratio 12 - 28 - - 12  Sodium 135 - 145 mmol/L 136 135 136  Potassium 3.5 - 5.1 mmol/L 3.9 3.5 3.7  Chloride 98 - 111 mmol/L 101 96(L) 96  CO2 22 - 32 mmol/L 26 30 26   Calcium 8.9 - 10.3 mg/dL 9.0 9.4 9.3    Current antihypertensive regimen:  Carvedilol 25 mg twice daily Furosemide 80 mg daily  Hydralazine 25 mg 3 tablets twice daily (per patient; dose changed to 50 mg and take 3 times daily)  How often are you checking your Blood Pressure? twice daily   Current home BP readings: 125/60, pulse 69 08/02/20. 142/70, pulse 66 at 8:30 am on 08/02/20.  What recent interventions/DTPs have been made by any provider to improve Blood Pressure control since last CPP Visit:  Patient is taking medications consistently at the same time each day.  Any recent hospitalizations or ED visits since last visit with CPP? No What diet changes have been made to improve Blood Pressure  Control?  Patient states her sister cooks without the use of salt.  What exercise is being done to improve your Blood Pressure Control?  Patient is physically active at home while taking care of grandchild.   Adherence Review: Is the patient currently on ACE/ARB medication? No Does the patient have >5 day gap between last estimated fill dates?  No    Goals Addressed             This Visit's Progress    Pharmacy Care Plan   On track    CARE PLAN ENTRY (see longitudinal plan of care for additional care plan information)  Current Barriers:  Chronic Disease Management support, education, and care coordination needs related to Hypertension, Hyperlipidemia, Diabetes, and Heart Failure   Hypertension BP Readings from Last 3 Encounters:  03/09/20 140/78  03/09/20 122/78  02/23/20 116/65   Pharmacist Clinical Goal(s): Over the next 90 days, patient will work with PharmD and providers to maintain BP goal <130/80 Current regimen:  Carvedilol 73m twice daily Furosemide 80 mg daily  Hydralazine 285m3 tablets twice daily Interventions: Patient mentioned medication changes Addition of metolazone (patient unsure of strength) Now taking hydralazine 7557mhree times daily Advised patient to make sure she has Hydralazine 74m27mblets instead of Hydralazine 25mg47mlets (patient mentioned taking 1 and 1/2 tablets three times daily, but [facility] and she would need to take 3 tablets at each dose to make 75mg) 78mfy dosing of medications with patient and Cardiologist at follow up AdvisedArecibot to take medications consistently at the same time each day Discussed appropriate goals for blood pressure less than 130/80 Patient self care activities - Over the next 90 days, patient will: Check BP daily, document, and provide at future appointments Ensure daily salt intake < 2300 mg/day Verify strength of hydralazine at home If 74mg- n94mto take  1 and 1/2 tablets for each dose to make 75mg If 36m- nee72m take 3 tablets for each dose to make 75mg  Hype70midemia Lab Results  Component Value Date/Time   LDLCALC 65 12/17/2019 11:46 AM   LDLCALC 65 10/22/2018 02:46 PM   LDLDIRECT 66.0 01/27/2015 11:04 AM   Pharmacist Clinical Goal(s): Over the next 90 days, patient will work with PharmD and providers to maintain LDL goal < 70 Current regimen:  Atorvastatin 10mg daily 80mrventions: Dietary recommendations provided Patient self care activities - Over the next 90 days, patient will: Take cholesterol medication daily as directed  Diabetes Lab Results  Component Value Date/Time   HGBA1C 7.6 (H) 11/09/2019 12:21 AM   HGBA1C 8.0 (H) 08/24/2019 03:30 PM   Pharmacist Clinical Goal(s): Over the next 90 days, patient will work with PharmD and providers to achieve A1c goal <7% Current regimen:  Humalog Kwikpen 2-4 units three times daily as needed Ozempic 1mg weekly T61meo 300 units/mL 34 units at bedtime Interventions: Dietary recommendations provided Discussed hypoglycemia and appropriate treatment Patient self care activities - Over the next 90 days, patient will: Check blood sugar 3-4 times daily, document, and provide at future appointments Contact provider with any episodes of hypoglycemia  Medication management Pharmacist Clinical Goal(s): Over the next 90 days, patient will work with PharmD and providers to maintain optimal medication adherence [facility] Comprehensive medication review performed. Medication list updated Continue current medication management strategy Patient self care activities - Over the next 90 days, patient will: Focus on medication adherence by using pill box Take medications as prescribed Report any questions or concerns to PharmD and/or provider(s)  Please see past updates related to this goal by clicking on the "Past Updates" button in the selected  goal          Follow-Up:  Pharmacist Review- Patient went through all medications to confirm on hand supply and states she will be  needing refill of ZENPEP PO 25,000-79,000 units (half of bottle left) LASIX 80 MG (2 weeks left) and hydrALAZINE 25 mg (almost a half bottle).   [facility] where Dr. Glenetta Hew office for refills of  ZENPEP PO 25,000-79,000 units, LASIX 80 MG, hydrALAZINE 25 mg. Unable to speak with a nurse staff and pharmacist located in the office. Left a voicemail for pharmacist to outreach the patient or myself with call back numbers.  [name], CPP Notified.  Raynelle Highland, [facility] Pharmacist Assistant [phone] CCM Total Time: 32 minutes  I have reviewed the care management and care coordination activities outlined in this encounter and I am certifying that I agree with the content of this note. Will follow up with CPA to find out if patient medication request have been completed.   1  minutes spent in review, coordination, and documentation.  Mayford Knife, [facility] 08/22/20 8:21 AM

## 2020-08-08 ENCOUNTER — Ambulatory Visit (INDEPENDENT_AMBULATORY_CARE_PROVIDER_SITE_OTHER): Payer: Medicare HMO

## 2020-08-08 ENCOUNTER — Telehealth: Payer: Medicare HMO

## 2020-08-08 ENCOUNTER — Other Ambulatory Visit: Payer: Self-pay | Admitting: Neurology

## 2020-08-08 DIAGNOSIS — G40909 Epilepsy, unspecified, not intractable, without status epilepticus: Secondary | ICD-10-CM

## 2020-08-08 DIAGNOSIS — I5032 Chronic diastolic (congestive) heart failure: Secondary | ICD-10-CM

## 2020-08-08 DIAGNOSIS — F339 Major depressive disorder, recurrent, unspecified: Secondary | ICD-10-CM

## 2020-08-08 DIAGNOSIS — E1122 Type 2 diabetes mellitus with diabetic chronic kidney disease: Secondary | ICD-10-CM

## 2020-08-08 DIAGNOSIS — N182 Chronic kidney disease, stage 2 (mild): Secondary | ICD-10-CM

## 2020-08-10 NOTE — Chronic Care Management (AMB) (Signed)
Chronic Care Management   CCM RN Visit Note  08/08/2020 Name: [name] MRN: [id number] DOB: 12-30-1945  Subjective: [name] is a 75 y.o. year old female who is a primary care patient of [name], MD. The care management team was consulted for assistance with disease management and care coordination needs.    Engaged with patient by telephone for follow up visit in response to provider referral for case management and/or care coordination services.   Consent to Services:  The patient was given information about Chronic Care Management services, agreed to services, and gave verbal consent prior to initiation of services.  Please see initial visit note for detailed documentation.   Patient agreed to services and verbal consent obtained.   Assessment: Review of patient past medical history, allergies, medications, health status, including review of consultants reports, laboratory and other test data, was performed as part of comprehensive evaluation and provision of chronic care management services.   SDOH (Social Determinants of Health) assessments and interventions performed:  Yes, no new acute challenges   CCM Care Plan  Allergies  Allergen Reactions  . Other Anaphylaxis and Rash    Bleach  . Penicillins Hives    Did it involve swelling of the face/tongue/throat, SOB, or low BP? No Did it involve sudden or severe rash/hives, skin peeling, or any reaction on the inside of your mouth or nose? Yes Did you need to seek medical attention at a hospital or doctor's office? Yes When did it last happen?Childhood allergy  If all above answers are "NO", may proceed with cephalosporin use.    . Sulfa Antibiotics Hives  . Aspirin Other (See Comments)     On aspirin 81 mg - Rectal bleeding in dec 2018  . Codeine     Headache and makes the patient feel "off"    Outpatient Encounter Medications as of 08/08/2020  Medication Sig  . insulin glargine, 2 Unit Dial,  (TOUJEO MAX SOLOSTAR) 300 UNIT/ML Solostar Pen Inject 25 Units into the skin at bedtime.  . insulin lispro (HUMALOG KWIKPEN) 100 UNIT/ML KwikPen Inject 0.02-0.04 mLs (2-4 Units total) into the skin 3 (three) times daily as needed (high blood sugar over 150).  Marland Kitchen lamoTRIgine (LAMICTAL) 150 MG tablet Take 1 tablet (150 mg total) by mouth 2 (two) times daily.  Marland Kitchen levETIRAcetam (KEPPRA) 500 MG tablet TAKE 2 TABLETS AT BEDTIME  . OZEMPIC, 1 MG/DOSE, 4 MG/3ML SOPN INJECT $RemoveBef'1MG'[id number]$  INTO THE SKIN ONCE A WEEK  . acetaminophen (TYLENOL) 500 MG tablet Take 500-1,000 mg by mouth every 4 (four) hours as needed for moderate pain.   Marland Kitchen albuterol (VENTOLIN HFA) 108 (90 Base) MCG/ACT inhaler Inhale 1-2 puffs into the lungs every 6 (six) hours as needed for wheezing or shortness of breath.  . Alcohol Swabs (ALCOHOL PADS) 70 % PADS Use as directed dx: e11.22  . Ascorbic Acid (VITAMIN C WITH ROSE HIPS) 1000 MG tablet Take 1,000 mg by mouth daily.  Marland Kitchen atorvastatin (LIPITOR) 10 MG tablet TAKE 1 TABLET EVERY EVENING.  . B-D ULTRAFINE III SHORT PEN 31G X 8 MM MISC USE DAILY WITH VICTOZA AND/OR NOVOLOG.  . Blood Glucose Calibration (TRUE METRIX LEVEL 1) Low SOLN Use as directed  Dx:e11.22  . Blood Glucose Monitoring Suppl (TRUE METRIX METER) w/Device KIT Use as directed to check blood sugars 2 times per day dx: e11.22  . carvedilol (COREG) 25 MG tablet Take 1 tablet (25 mg total) by mouth 2 (two) times daily with a meal.  .  Cholecalciferol (VITAMIN D-3) 1000 units CAPS Take 5,000 Units by mouth daily.   Marland Kitchen dexlansoprazole (DEXILANT) 60 MG capsule Take 1 capsule (60 mg total) by mouth daily.  . diclofenac sodium (VOLTAREN) 1 % GEL Apply 2 g topically 4 (four) times daily as needed (pain).  . ferrous sulfate 324 MG TBEC Take 324 mg by mouth 2 (two) times daily.  . furosemide (LASIX) 80 MG tablet Take 1 tablet (80 mg total) by mouth daily. 80mg  daily Monday-Friday, 40mg  on Saturday and Sunday  . glucose blood (TRUE METRIX BLOOD GLUCOSE  TEST) test strip Use as directed to check blood sugars 2 times per day dx: e11.22  . hydrALAZINE (APRESOLINE) 25 MG tablet Take 100 mg by mouth 3 (three) times daily.  . hydrocortisone cream 1 % Apply 1 application topically daily as needed for itching.   . Isopropyl Alcohol (ALCOHOL WIPES) 70 % MISC Apply 1 each topically 2 (two) times daily.  . isosorbide mononitrate (IMDUR) 30 MG 24 hr tablet Take 1 tablet (30 mg total) by mouth daily.  . Menthol, Topical Analgesic, (STOPAIN ROLL-ON EX) Apply 1 application topically daily as needed (pain).  Marland Kitchen neomycin-bacitracin-polymyxin (NEOSPORIN) ointment Apply 1 application topically as needed for wound care.   . ondansetron (ZOFRAN) 4 MG tablet   . Pancrelipase, Lip-Prot-Amyl, (ZENPEP PO) Take by mouth.  . Pancrelipase, Lip-Prot-Amyl, 25000-79000 units CPEP Take 1-2 capsules by mouth See admin instructions. Take 2 capsules in the morning, 1 capsule with lunch, and 2 capsules in the evening.  . potassium chloride SA (KLOR-CON) 20 MEQ tablet TAKE 2 TABLETS EVERY DAY BUT WHEN TAKING METOLAZONE TAKE 3 TABLETS DAILY AS DIRECTED  . sennosides-docusate sodium (SENOKOT-S) 8.6-50 MG tablet Take 1 tablet by mouth daily.  . sertraline (ZOLOFT) 50 MG tablet Take 1 tablet (50 mg total) by mouth daily.  Marland Kitchen tolnaftate (TINACTIN) 1 % cream Apply 1 application topically daily as needed (foot fungus).   . TRUEplus Lancets 33G MISC Use as directed to check blood sugars 2 times per day dx: e11.22  . zinc sulfate 220 (50 Zn) MG capsule Take 220 mg by mouth daily.   No facility-administered encounter medications on file as of 08/08/2020.    Patient Active Problem List   Diagnosis Date Noted  . Postinflammatory pulmonary fibrosis ([place]) 06/08/2020  . Lung nodule 06/08/2020  . Chronic kidney disease, stage 2 (mild) 01/05/2020  . Hypertensive chronic kidney disease with stage 1 through stage 4 chronic kidney disease, or unspecified chronic kidney disease 01/05/2020  . Snoring  01/05/2020  . Neuroleptic-induced parkinsonism ([place]) 09/28/2019  . Class 3 severe obesity due to excess calories with serious comorbidity and body mass index (BMI) of 40.0 to 44.9 in adult [facility]) 05/22/2019  . Pulmonary hypertension, unspecified ([place]) 04/06/2019  . Syncope 02/24/2019  . Rheumatoid arthritis ([place]) 01/21/2019  . Radial styloid tenosynovitis 11/24/2018  . Type 2 diabetes mellitus with stage 2 chronic kidney disease, with long-term current use of insulin ([place]) 07/21/2018  . Chest pain 05/15/2018  . 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) 04/24/2018  . Excessive daytime sleepiness 01/30/2018  . Sleep-related hypoventilation 01/30/2018  . Recurrent depression ([place]) 11/06/2017  . OSA on CPAP 10/07/2017  . Todd's paralysis ([place])   . Chronic diastolic CHF (congestive heart failure), NYHA class 2 ([place])   . Chronic respiratory failure with hypoxia ([place])   . Acute lower GI bleeding   . Stroke-like symptoms 06/15/2017  . Rectal bleeding 06/15/2017  .  Dehydration 03/07/2017  . Medication management 12/24/2016  . Near syncope 03/09/2016  . Racing heart beat 03/09/2016  . Dyspnea 01/28/2016  . Spinal stenosis at L4-L5 level 10/04/2014  . Parkinsonism ([place]) 06/24/2013  . Lower abdominal pain 10/31/2012  . Type II diabetes mellitus, uncontrolled ([place]) 10/31/2012  . Seizure disorder ([place]) 10/31/2012  . Diverticulosis 10/31/2012  . Anemia associated with acute blood loss 08/14/2012  . Hematochezia 08/14/2012  . Varicose veins of bilateral lower extremities with other complications 24/40/1027  . Leg pain 03/21/2012  . Radicular pain of left lower extremity 03/21/2012  . Heart murmur 11/21/2011  . Infected pseudocyst of pancreas 10/28/2011  . Chronic pancreatitis ([place]) 10/28/2011  . Hypertensive heart disease with chronic diastolic congestive heart failure ([place]) 10/28/2011  . Dyslipidemia, goal LDL below 100 10/28/2011  . Morbid  obesity with BMI of 45.0-49.9, adult ([place]) 10/28/2011  . Pseudoseizure 10/24/2011  . Gastroesophageal reflux 10/23/2011  . History of tobacco abuse 10/23/2011  . Hypoxemia 10/23/2011  . Disturbance in sleep behavior 09/03/2011  . Partial epilepsy with impairment of consciousness ([place]) 09/03/2011  . Tremor 09/03/2011  . Peripheral edema 04/14/2011  . Obesity hypoventilation syndrome ([place]) 10/26/2010  . Atherosclerotic heart disease of native coronary artery with other forms of angina pectoris ([place]) 08/28/2010  . Acute, but ill-defined, cerebrovascular disease 05/04/2008  . Arthropathy 05/04/2008  . Disequilibrium 05/04/2008  . Generalized tonic clonic epilepsy ([place]) 05/04/2008  . Increased frequency of urination 05/04/2008  . Insomnia, unspecified 05/04/2008  . Major depressive disorder, single episode, unspecified 05/04/2008  . Memory loss 05/04/2008  . Nausea 05/04/2008  . Other malaise and fatigue 05/04/2008  . Swelling of limb 05/04/2008    Conditions to be addressed/monitored:DM II, CHF, Seizure disorder, Recurrent depression    Care Plan : Heart Failure (Adult)  Updates made by Lynne Logan, RN since 08/10/2020 12:00 AM    Problem: Activity Tolerance (Heart Failure)   Priority: High  Onset Date: 06/27/2020    Long-Range Goal: Activity Tolerance Optimized   Start Date: 06/27/2020  Expected End Date: 12/26/2020  Recent Progress: On track  Priority: High  Note:   Current Barriers:  Marland Kitchen Knowledge deficit related to basic heart failure pathophysiology and self care management . Knowledge Deficits related to heart failure medications . Activity Intolerance  Case Manager Clinical Goal(s):  Marland Kitchen Over the next 180 days, patient will verbalize understanding of Heart Failure Action Plan and when to call doctor Interventions:  08/08/20 completed successful call with patient  . Provided verbal education on low sodium diet . Provided written education on low sodium diet . Reviewed Heart  Failure Action Plan in depth and provided written copy . Discussed importance of daily weight and advised patient to weigh and record daily . - self-awareness of signs/symptoms of worsening disease encouraged  Promote daily physical activity that improves functional ability, cognition and quality of life.   Encourage optimal, safe functional mobility and self-care performance based on ability and tolerance.    Promote breathing and energy conservation techniques, such as pursed-lip breathing, preplanning and pacing of activity, balancing activity and rest.   Determined patient is weighing daily and recording her weights, patient is able to verbalize the correct CHF protocol for weight gain and when to call the doctor  Determined patient was instructed by [facility] nurse what dosage of Furosemide to take with weight gain per CHF protocol  Discussed plans with patient for ongoing care management follow up and provided patient with direct contact information  for care management team Patient Goals/Self-Care Activities . Over the next 180 days, patient will:   - Takes Heart Failure Medications as prescribed Weighs daily and record (notifying MD of 3 lb weight gain over night or 5 lb in a week) Verbalizes understanding of and follows CHF Action Plan Adheres to low sodium diet - develop a rescue plan - eat more whole grains, fruits and vegetables, lean meats and healthy fats - follow rescue plan if symptoms flare-up - know when to call the doctor - track symptoms and what helps feel better or worse - take Jardiance as directed by Cardiologist  Follow Up Plan: Telephone follow up appointment with care management team member scheduled for:  10/10/20   Problem: Obstructive Sleep Apnea (Heart Failure)   Priority: High    Long-Range Goal: Effective Breathing Pattern During Sleep   Start Date: 06/27/2020  Expected End Date: 12/26/2020  Recent Progress: On track  Priority: High  Note:    Current Barriers:   Ineffective Self Health Maintenance  Activity Intolerance   Remote Smoker   Currently UNABLE TO independently self manage needs related to chronic health conditions.   Knowledge Deficits related to short term plan for care coordination needs and long term plans for chronic disease management needs Nurse Case Manager Clinical Goal(s):   Over the next 180 days, patient will work with care management team to address care coordination and chronic disease management needs related to Disease Management  Educational Needs  Care Coordination  Medication Management and Education  Psychosocial Support   Interventions:  08/08/20 completed call with patient   Encouraged a nonsupine sleep position, such as side-lying, head of bed elevated, multiple pillows, to prevent airway collapse.   Assessed for adherence to CPAP usage as prescribed  Determined patient is wearing her CPAP as directed, she denies having barriers that would cause non-adherence  Discussed plans with patient for ongoing care management follow up and provided patient with direct contact information for care management team Patient Goals/Self Care Activities:  Over the next 180 days, patient will . Wear her CPAP machine as directed  . Report barriers that may prevent CPAP adherence  Follow Up Plan: Telephone follow up appointment with care management team member scheduled for:  10/10/20   Care Plan : Diabetes Type 2 (Adult)  Updates made by Lynne Logan, RN since 08/10/2020 12:00 AM    Problem: Disease Progression (Diabetes, Type 2)   Priority: High    Long-Range Goal: Disease Progression Prevented or Minimized   Start Date: 06/27/2020  Expected End Date: 12/26/2020  Recent Progress: On track  Priority: High  Note:   Objective:  Lab Results  Component Value Date   HGBA1C 6.5 (H) 06/06/2020 .   Lab Results  Component Value Date   CREATININE 0.88 06/20/2020   CREATININE 0.89 06/06/2020    CREATININE 0.97 03/09/2020 .   Marland Kitchen No results found for: EGFR Current Barriers:  Marland Kitchen Knowledge Deficits related to basic Diabetes pathophysiology and self care/management . Knowledge Deficits related to medications used for management of diabetes . Activity Intolerance . Remote Smoker  Case Manager Clinical Goal(s):  Marland Kitchen Over the next 180 days, patient will demonstrate improved adherence to prescribed treatment plan for diabetes self care/management as evidenced by:  . daily monitoring and recording of CBG  . adherence to ADA/ carb modified diet . adherence to prescribed medication regimen Interventions:  08/08/20 successful call completed with patient  . Provided education to patient about basic DM disease  process . Review of patient status, including review of consultants reports, relevant laboratory and other test results, and medications completed. . Reviewed medications with patient and discussed importance of medication adherence . Confirmed patient received and reviewed printed educational materials related to hypo and hyperglycemia and importance of correct treatment . Advised patient, providing education and rationale, to check cbg before meals and at bedtime and record, calling the CCM team and or PCP for findings outside established parameters.   . Encouraged patient to change lifestyle such as increasing intake of plant-based foods, stress reduction, consistent physical activity and smoking cessation to prevent long-term complications and chronic disease.   . Discussed plans with patient for ongoing care management follow up and provided patient with direct contact information for care management team Patient Goals/Self-Care Activities . Over the next 180 days, patient will:  - Self administers oral medications as prescribed Self administers insulin as prescribed Attends all scheduled provider appointments Checks blood sugars as prescribed and utilize hyper and hypoglycemia protocol  as needed Adheres to prescribed ADA/carb modified - check blood sugar at prescribed times - check blood sugar if I feel it is too high or too low - take the blood sugar log to all doctor visits - take the blood sugar meter to all doctor visits  Follow Up Plan: Telephone follow up appointment with care management team member scheduled for: 10/10/20     Plan:Telephone follow up appointment with care management team member scheduled for:  10/10/20  Barb Merino, RN, BSN, CCM Care Management Coordinator [place] [facility]  Direct Phone: [phone]

## 2020-08-10 NOTE — Patient Instructions (Signed)
Goals Addressed    . Manage OSA with usage of CPAP   On track    Timeframe:  Long-Range Goal Priority:  High Start Date:  06/27/20                           Expected End Date: 12/25/20        Follow up date: 08/08/20  Over the next 90 days, patient will . Wear her CPAP machine as directed  . Report barriers that may prevent CPAP adherence                 . Manage symptoms of depression and Assist with Housing       Timeframe:  Short-Term Goal Priority:  High Start Date:  06/27/20                           Expected End Date:  12/25/20                        Follow up date: 08/08/20  -work with embedded BSW to address housing needs if needed  -notify PCP of new or worsening symptoms of anxiety or depression  -take medications exactly as prescribed without missed doses      . Monitor and Manage My Blood Sugar-Diabetes Type 2   On track    Timeframe:  Long-Range Goal Priority:  High Start Date: 06/27/20                       Expected End Date: 12/25/20                  Follow Up Date: 08/08/20    - check blood sugar at prescribed times - check blood sugar if I feel it is too high or too low - take the blood sugar log to all doctor visits - take the blood sugar meter to all doctor visits  Self administers oral medications as prescribed Self administers insulin as prescribed Attends all scheduled provider appointments Checks blood sugars as prescribed and utilize hyper and hypoglycemia protocol as needed Adheres to prescribed ADA/carb modified  Why is this important?    Checking your blood sugar at home helps to keep it from getting very high or very low.   Writing the results in a diary or log helps the doctor know how to care for you.   Your blood sugar log should have the time, date and the results.   Also, write down the amount of insulin or other medicine that you take.   Other information, like what you ate, exercise done and how you were feeling, will also be helpful.      Notes:     . Track and Manage Symptoms-Heart Failure   On track    Timeframe:  Long-Range Goal Priority:  High Start Date: 06/27/20                         Expected End Date:  12/25/20                    Follow Up Date: 08/08/20   - develop a rescue plan - eat more whole grains, fruits and vegetables, lean meats and healthy fats - follow rescue plan if symptoms flare-up - know when to call the doctor - track symptoms and what  helps feel better or worse  - take Jardiance as directed by Cardiologist    Why is this important?    You will be able to handle your symptoms better if you keep track of them.   Making some simple changes to your lifestyle will help.   Eating healthy is one thing you can do to take good care of yourself.    Notes:

## 2020-08-11 ENCOUNTER — Ambulatory Visit (INDEPENDENT_AMBULATORY_CARE_PROVIDER_SITE_OTHER): Payer: Medicare HMO

## 2020-08-11 ENCOUNTER — Other Ambulatory Visit: Payer: Self-pay | Admitting: Internal Medicine

## 2020-08-11 VITALS — Ht 61.0 in | Wt 195.0 lb

## 2020-08-11 DIAGNOSIS — Z0000 Encounter for general adult medical examination without abnormal findings: Secondary | ICD-10-CM

## 2020-08-11 DIAGNOSIS — F339 Major depressive disorder, recurrent, unspecified: Secondary | ICD-10-CM

## 2020-08-11 NOTE — Progress Notes (Signed)
I connected with [name] today by telephone and verified that I am speaking with the correct person using two identifiers. Location patient: home Location provider: work Persons participating in the virtual visit: Kristell Wooding, Glenna Durand LPN.   I discussed the limitations, risks, security and privacy concerns of performing an evaluation and management service by telephone and the availability of in person appointments. I also discussed with the patient that there may be a patient responsible charge related to this service. The patient expressed understanding and verbally consented to this telephonic visit.    Interactive audio and video telecommunications were attempted between this provider and patient, however failed, due to patient having technical difficulties OR patient did not have access to video capability.  We continued and completed visit with audio only.     Vital signs may be patient reported or missing.  Subjective:   [name] BEGIN is a 75 y.o. female who presents for Medicare Annual (Subsequent) preventive examination.  Review of Systems     Cardiac Risk Factors include: advanced age (>36mn, >>25women);diabetes mellitus;obesity (BMI >30kg/m2);sedentary lifestyle     Objective:    Today's Vitals   08/11/20 1536 08/11/20 1537  Weight: 195 lb (88.5 kg)   Height: 5' 1"  (1.549 m)   PainSc:  3    Body mass index is 36.84 kg/m.  Advanced Directives 08/11/2020 01/06/2020 11/08/2019 09/11/2019 08/01/2019 04/06/2019 02/24/2019  Does Patient Have a Medical Advance Directive? Yes Yes No Yes Yes Yes Yes  Type of AParamedicof AP[place]Living will [place]Living will - [place] will (No Data) [place] will Living will;Healthcare Power of Attorney  Does patient want to make changes to medical advance directive? - - - - No - Patient declined No - Patient declined No - Patient  declined  Copy of HRichmond Hillin Chart? Yes - validated most recent copy scanned in chart (See row information) Yes - validated most recent copy scanned in chart (See row information) - - - No - copy requested No - copy requested  Would patient like information on creating a medical advance directive? - - No - Patient declined - - - -  Pre-existing out of facility DNR order (yellow form or pink MOST form) - - - - - - -    Current Medications (verified) Outpatient Encounter Medications as of 08/11/2020  Medication Sig  . acetaminophen (TYLENOL) 500 MG tablet Take 500-1,000 mg by mouth every 4 (four) hours as needed for moderate pain.   .Marland Kitchenalbuterol (VENTOLIN HFA) 108 (90 Base) MCG/ACT inhaler Inhale 1-2 puffs into the lungs every 6 (six) hours as needed for wheezing or shortness of breath.  . Alcohol Swabs (ALCOHOL PADS) 70 % PADS Use as directed dx: e11.22  . Ascorbic Acid (VITAMIN C WITH ROSE HIPS) 1000 MG tablet Take 1,000 mg by mouth daily.  .Marland Kitchenatorvastatin (LIPITOR) 10 MG tablet TAKE 1 TABLET EVERY EVENING.  . B-D ULTRAFINE III SHORT PEN 31G X 8 MM MISC USE DAILY WITH VICTOZA AND/OR NOVOLOG.  . Blood Glucose Calibration (TRUE METRIX LEVEL 1) Low SOLN Use as directed  Dx:e11.22  . Blood Glucose Monitoring Suppl (TRUE METRIX METER) w/Device KIT Use as directed to check blood sugars 2 times per day dx: e11.22  . carvedilol (COREG) 25 MG tablet Take 1 tablet (25 mg total) by mouth 2 (two) times daily with a meal.  . Cholecalciferol (VITAMIN D-3) 1000 units CAPS Take  5,000 Units by mouth daily.   . diclofenac sodium (VOLTAREN) 1 % GEL Apply 2 g topically 4 (four) times daily as needed (pain).  . furosemide (LASIX) 80 MG tablet Take 1 tablet (80 mg total) by mouth daily. 80mg  daily Monday-Friday, 40mg  on Saturday and Sunday  . glucose blood (TRUE METRIX BLOOD GLUCOSE TEST) test strip Use as directed to check blood sugars 2 times per day dx: e11.22  . hydrALAZINE (APRESOLINE) 25 MG  tablet Take 100 mg by mouth 3 (three) times daily.  . hydrocortisone cream 1 % Apply 1 application topically daily as needed for itching.   . insulin glargine, 2 Unit Dial, (TOUJEO MAX SOLOSTAR) 300 UNIT/ML Solostar Pen Inject 25 Units into the skin at bedtime.  . insulin lispro (HUMALOG KWIKPEN) 100 UNIT/ML KwikPen Inject 0.02-0.04 mLs (2-4 Units total) into the skin 3 (three) times daily as needed (high blood sugar over 150).  . Isopropyl Alcohol (ALCOHOL WIPES) 70 % MISC Apply 1 each topically 2 (two) times daily.  . isosorbide mononitrate (IMDUR) 30 MG 24 hr tablet Take 1 tablet (30 mg total) by mouth daily.  Marland Kitchen lamoTRIgine (LAMICTAL) 150 MG tablet Take 1 tablet (150 mg total) by mouth 2 (two) times daily.  Marland Kitchen levETIRAcetam (KEPPRA) 500 MG tablet TAKE 2 TABLETS AT BEDTIME  . Menthol, Topical Analgesic, (STOPAIN ROLL-ON EX) Apply 1 application topically daily as needed (pain).  Marland Kitchen neomycin-bacitracin-polymyxin (NEOSPORIN) ointment Apply 1 application topically as needed for wound care.   . ondansetron (ZOFRAN) 4 MG tablet   . OZEMPIC, 1 MG/DOSE, 4 MG/3ML SOPN INJECT 1MG  INTO THE SKIN ONCE A WEEK  . Pancrelipase, Lip-Prot-Amyl, (ZENPEP PO) Take by mouth.  . Pancrelipase, Lip-Prot-Amyl, 25000-79000 units CPEP Take 1-2 capsules by mouth See admin instructions. Take 2 capsules in the morning, 1 capsule with lunch, and 2 capsules in the evening.  . potassium chloride SA (KLOR-CON) 20 MEQ tablet TAKE 2 TABLETS EVERY DAY BUT WHEN TAKING METOLAZONE TAKE 3 TABLETS DAILY AS DIRECTED  . sennosides-docusate sodium (SENOKOT-S) 8.6-50 MG tablet Take 1 tablet by mouth daily.  . sertraline (ZOLOFT) 50 MG tablet TAKE 1 TABLET EVERY DAY  . tolnaftate (TINACTIN) 1 % cream Apply 1 application topically daily as needed (foot fungus).   . TRUEplus Lancets 33G MISC Use as directed to check blood sugars 2 times per day dx: e11.22  . dexlansoprazole (DEXILANT) 60 MG capsule Take 1 capsule (60 mg total) by mouth daily.  (Patient not taking: Reported on 08/11/2020)  . ferrous sulfate 324 MG TBEC Take 324 mg by mouth 2 (two) times daily. (Patient not taking: Reported on 08/11/2020)  . zinc sulfate 220 (50 Zn) MG capsule Take 220 mg by mouth daily. (Patient not taking: Reported on 08/11/2020)   No facility-administered encounter medications on file as of 08/11/2020.    Allergies (verified) Other, Penicillins, Sulfa antibiotics, Aspirin, and Codeine   History: Past Medical History:  Diagnosis Date  . Abnormal liver function     in the past.  . Anemia   . Arthritis    all over  . Bruises easily   . Cataract    right eye;immature  . CHF (congestive heart failure) ([place])   . Chronic back pain    stenosis  . Chronic cough   . Chronic kidney disease   . COPD (chronic obstructive pulmonary disease) ([place])   . COVID    aug/sept 2021  . Demand myocardial infarction [facility]) 2012   Demand Infarction in setting of Pancreatitis -->  mild Troponin elevation, NON-OBSTRUCTIVE CAD  . Depression    takes Abilify daily as well as Zoloft  . Diastolic heart failure 0626   Grade 1 diastolic Dysfunction by Echo   . Diverticulosis   . DM (diabetes mellitus) ([place])    takes Victoza daily as well as Lantus and Humalog  . Empty sella ([place])    on MRI in 2009.  . Glaucoma   . Headache(784.0)    last migraine-4-8yrs ago  . History of blood transfusion    no abnormal reaction noted  . History of colon polyps    benign  . HTN (hypertension)    takes [employer] daily  . Hyperlipidemia    takes Lipitor daily  . Joint swelling   . Nocturia   . Obesity hypoventilation syndrome ([place])   . Obstructive sleep apnea 02/2018   Notably improved split-night study with weight loss from 270 (2017) down to 250 pounds (2019).  . Pancreatitis    takes Pancrelipase daily  . Parkinson's disease ([place])    takes Sinemet daily  . Peripheral neuropathy   . Pneumonia 2012  . Seizures ([place])    takes Lamictal daily and  Primidone nightly;last seizure 2wks ago  . Urinary urgency    With increased frequency  . Varicose veins of both lower extremities with pain    With edema.  Takes daily Lasix   Past Surgical History:  Procedure Laterality Date  . ABDOMINAL HYSTERECTOMY    . APPENDECTOMY    . BACK SURGERY    . Cardiac Event Monitor  September-October 2017   Sinus rhythm with occasional PACs and artifact. No arrhythmias besides one short run of tachycardia.  . CHOLECYSTECTOMY    . COLONOSCOPY N/A 08/15/2012   Procedure: COLONOSCOPY;  Surgeon: Arta Silence, MD;  Location: [facility];  Service: Endoscopy;  Laterality: N/A;  . COLONOSCOPY WITH PROPOFOL Left 06/17/2017   Procedure: COLONOSCOPY WITH PROPOFOL;  Surgeon: [name], MD;  Location: [place];  Service: Gastroenterology;  Laterality: Left;  . CORONARY CALCIUM SCORE AND CTA  04/2018   Coronary calcium score 71.9.  Very large, hyperdynamic LAD wrapping apex giving rise to PDA.  Moderate LAD-diagonal and circumflex plaque -> CT FFR suggested positive findings in distal D1, D2 and distal circumflex.  Referred for cath. ==> FALSE POSITIVE  . ESOPHAGOGASTRODUODENOSCOPY    . eye cysts Bilateral   . LASIK    . LEFT HEART CATH AND CORONARY ANGIOGRAPHY N/A 05/16/2018   Procedure: LEFT HEART CATH AND CORONARY ANGIOGRAPHY;  Surgeon: Belva Crome, MD;  Location: [facility] INVASIVE CV LAB:  Angiographically normal coronary arteries with LVEDP of 21 mmHg.  Large draping hyperdominant LAD that wraps the apex and provides distal half of the PDA.  Relatively small caliber distal Cx -->proxLPDA, non-dom RCA. No Cx or Diag lesions -- FALSE + CT FFR  . LEFT HEART CATH AND CORONARY ANGIOGRAPHY  2009/March 2012   2009: (Dr. Alvie Heidelberg) Nonobstructive CAD; 2012: Minimal CAD --> false positive stress test  . NM MYOVIEW LTD  01/2016; 01/2018   a)LOW RISK. No ischemia or infarction;; b) EF 55-60%. NO ST changes.  No ischemia or Infarction. NO significant RV enlargement.  LOW  RISK.  Marland Kitchen Polysomnogram  02/2018   (Dr. Brett Fairy from neurology): Split study was not adequately done due to low AHI.  She slept reclined and had an AHI less than 10, prolonged hypoxemia and no hypercapnia. -->  She has home oxygen already prescribed, but did not meet criteria  for nightly oxygen.  (Was noted that the patient did not cooperate well with study.  Titration was discussed.  Marland Kitchen PRESSURE SENSOR/CARDIOMEMS N/A 11/09/2019   Procedure: PRESSURE SENSOR/CARDIOMEMS;  Surgeon: Larey Dresser, MD;  Location: [place] CV [facility];  Service: Cardiovascular;  Laterality: N/A;  . RECTAL POLYPECTOMY    . RIGHT/LEFT HEART CATH AND CORONARY ANGIOGRAPHY N/A 04/06/2019   Procedure: RIGHT/LEFT HEART CATH AND CORONARY ANGIOGRAPHY;  Surgeon: Leonie Man, MD;  Location: [facility] INVASIVE CV LAB; angiographically normal coronary arteries.  Moderately elevated AD LVEDP..  Mild pulmonary hypertension: PA P 56/14 mmHg-mean 31 mmHg.  RAP 10 mmHg.  RV P-EDP 54/4 mmHg - 12 mmHg.  PCWP 11-15 mmHg.  CO-CI: 7.35-3.73.  . TONSILLECTOMY    . TRANSTHORACIC ECHOCARDIOGRAM  01/2016; 05/2017   A) Normal LV chamber size with mod LVH pattern. EF 50-55%. Severe LA dilation. Mod RA dilation. PAP elevated at 38 mmHg;; B) Mild concentric LVH.  EF 55-60% & no RWMA.  Grade 2 DD.  Diastolic flattening of the ventricular septum == ? elevated PAP.  Mild aortic valve calcification/sclerosis.  Mod LA dilation.  Mild RV dilation & Mod RA dilation  . TRANSTHORACIC ECHOCARDIOGRAM  10/2017; 01/2018   a) EF 55-60%.  No RWMA,  Moderate LA dilation.  Mild LA dilation.  Peak PA pressures ~57 mmHg (moderate);;; b) EF 55-60%.  GRII DD.  Suggestion of RV volume overload with moderate RV dilation.  Severe LA and RA dilation.Marland Kitchen  PA pressure ~67%.  . TRANSTHORACIC ECHOCARDIOGRAM  01/2019   Normal LV size and function.  EF 50 to 55%.  Impaired relaxation.  RV appears to have moderately reduced function.  Severely enlarged.  Cannot fully assess RV pressures.   Hypermobile interatrial septum.  Right atrium severely dilated.  . TUBAL LIGATION    . vaginal cyst removed     several times   Family History  Problem Relation Age of Onset  . Allergies Father   . Heart disease Father        before 37  . Heart failure Father   . Hypertension Father   . Hyperlipidemia Father   . Heart disease Mother   . Diabetes Mother   . Hypertension Mother   . Hyperlipidemia Mother   . Hypertension Sister   . Heart disease Sister        before 53  . Hyperlipidemia Sister   . Diabetes Brother   . Hypertension Brother   . Diabetes Sister   . Hypertension Sister   . Diabetes Son   . Hypertension Son   . Cancer Other    Social History   Socioeconomic History  . Marital status: Divorced    Spouse name: Not on file  . Number of children: Not on file  . Years of education: Not on file  . Highest education level: Not on file  Occupational History  . Occupation: Disabled    Comment: CNA  Tobacco Use  . Smoking status: Former Smoker    Packs/day: 0.50    Years: 25.00    Pack years: 12.50    Types: Cigarettes  . Smokeless tobacco: Never Used  . Tobacco comment: quit smoking 20+ytrs ago  Vaping Use  . Vaping Use: Never used  Substance and Sexual Activity  . Alcohol use: No  . Drug use: No  . Sexual activity: Not Currently    Birth control/protection: Surgical  Other Topics Concern  . Not on file  Social History Narrative   She  lives in [place]. She has lots of family in the area and is accompanied by her sister.   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   She has a distant history of smoking, quit 20 years ago.   Social Determinants of Health   Financial Resource Strain: Low Risk   . Difficulty of Paying Living Expenses: Not hard at all  Food Insecurity: No Food Insecurity  . Worried About Charity fundraiser in the Last Year: Never true  . Ran Out of Food in the Last Year: Never true  Transportation  Needs: No Transportation Needs  . Lack of Transportation (Medical): No  . Lack of Transportation (Non-Medical): No  Physical Activity: Inactive  . Days of Exercise per Week: 0 days  . Minutes of Exercise per Session: 0 min  Stress: No Stress Concern Present  . Feeling of Stress : Not at all  Social Connections: Not on file    Tobacco Counseling Counseling given: Not Answered Comment: quit smoking 20+ytrs ago   Clinical Intake:  Pre-visit preparation completed: Yes  Pain : 0-10 Pain Score: 3  Pain Location: Neck Pain Orientation: Right Pain Descriptors / Indicators: Aching Pain Frequency: Intermittent     Nutritional Status: BMI > 30  Obese Nutritional Risks: Nausea/ vomitting/ diarrhea (nausea and vomiting recently due to medication taste) Diabetes: Yes  How often do you need to have someone help you when you read instructions, pamphlets, or other written materials from your doctor or pharmacy?: 1 - Never What is the last grade level you completed in school?: some college  Diabetic? Yes Nutrition Risk Assessment:  Has the patient had any N/V/D within the last 2 months?  Yes  Does the patient have any non-healing wounds?  No  Has the patient had any unintentional weight loss or weight gain?  Yes   Diabetes:  Is the patient diabetic?  Yes  If diabetic, was a CBG obtained today?  No  Did the patient bring in their glucometer from home?  No  How often do you monitor your CBG's? 3 times daily.   Financial Strains and Diabetes Management:  Are you having any financial strains with the device, your supplies or your medication? No .  Does the patient want to be seen by Chronic Care Management for management of their diabetes?  No  Would the patient like to be referred to a Nutritionist or for Diabetic Management?  No   Diabetic Exams:  Diabetic Eye Exam: Completed 02/02/2020 Diabetic Foot Exam: Overdue, Pt has been advised about the importance in completing this  exam. Pt is scheduled for diabetic foot exam on next appointment.   Interpreter Needed?: No  Information entered by :: NAllen LPN   Activities of Daily Living In your present state of health, do you have any difficulty performing the following activities: 08/11/2020 01/06/2020  Hearing? Y Y  Comment sometimes a little  Vision? Y Y  Comment - blurry sometimes  Difficulty concentrating or making decisions? N Y  Comment - forgetfulness  Walking or climbing stairs? Y Y  Comment - uses cane  Dressing or bathing? N N  Doing errands, shopping? Y N  Comment sister drives -  Conservation officer, nature and eating ? N N  Using the Toilet? N N  In the past six months, have you accidently leaked urine? N Y  Comment - wears a pad  Do you have problems with loss of bowel control? N N  Managing  your Medications? N Y  Comment - sister helps sometimes  Managing your Finances? N N  Housekeeping or managing your Housekeeping? N N  Some recent data might be hidden    Patient Care Team: [name], MD as PCP - General (Internal Medicine) Leonie Man, MD as PCP - Cardiology (Cardiology) Conni Slipper, NP as Referring Physician (Nurse Practitioner) Arta Silence, MD as Attending Physician (Gastroenterology) Elayne Snare, MD as Attending Physician (Endocrinology) Matilde Bash, MD as Referring Physician (Neurology) Inocencio Homes, DPM as Consulting Physician (Podiatry) Daneen Schick as Social Worker Little, Claudette Stapler, RN as Case Manager [place], Kennieth Francois, [place] (Inactive) (Pharmacist)  Indicate any recent Medical Services you may have received from other than [facility] providers in the past year (date may be approximate).     Assessment:   This is a routine wellness examination for Qu[place].  Hearing/Vision screen No exam data present  Dietary issues and exercise activities discussed: Current Exercise Habits: The patient does not participate in regular exercise at present  Goals    .  Manage OSA  with usage of CPAP      Timeframe:  Long-Range Goal Priority:  High Start Date:  06/27/20                           Expected End Date: 12/25/20        Follow up date: 08/08/20  Over the next 90 days, patient will . Wear her CPAP machine as directed  . Report barriers that may prevent CPAP adherence                 .  Manage symptoms of depression and Assist with Housing      Timeframe:  Short-Term Goal Priority:  High Start Date:  06/27/20                           Expected End Date:  12/25/20                        Follow up date: 08/08/20  -work with embedded BSW to address housing needs if needed  -notify PCP of new or worsening symptoms of anxiety or depression  -take medications exactly as prescribed without missed doses      .  Monitor and Manage My Blood Sugar-Diabetes Type 2      Timeframe:  Long-Range Goal Priority:  High Start Date: 06/27/20                       Expected End Date: 12/25/20                  Follow Up Date: 08/08/20    - check blood sugar at prescribed times - check blood sugar if I feel it is too high or too low - take the blood sugar log to all doctor visits - take the blood sugar meter to all doctor visits  Self administers oral medications as prescribed Self administers insulin as prescribed Attends all scheduled provider appointments Checks blood sugars as prescribed and utilize hyper and hypoglycemia protocol as needed Adheres to prescribed ADA/carb modified  Why is this important?    Checking your blood sugar at home helps to keep it from getting very high or very low.   Writing the results in a diary or log helps the doctor know how to care for  you.   Your blood sugar log should have the time, date and the results.   Also, write down the amount of insulin or other medicine that you take.   Other information, like what you ate, exercise done and how you were feeling, will also be helpful.     Notes:     .  Patient Stated      01/06/2020,  stay healthy    .  Patient Stated      08/11/2020, want to weigh 160-165 pounds    .  Pharmacy Care Plan      CARE PLAN ENTRY (see longitudinal plan of care for additional care plan information)  Current Barriers:  . Chronic Disease Management support, education, and care coordination needs related to Hypertension, Hyperlipidemia, Diabetes, and Heart Failure   Hypertension BP Readings from Last 3 Encounters:  03/09/20 140/78  03/09/20 122/78  02/23/20 116/65   . Pharmacist Clinical Goal(s): o Over the next 90 days, patient will work with PharmD and providers to maintain BP goal <130/80 . Current regimen:  o Carvedilol 36m twice daily o Furosemide 80 mg daily  o Hydralazine 264m3 tablets twice daily . Interventions: o Patient mentioned medication changes - Addition of metolazone (patient unsure of strength) - Now taking hydralazine 7511mhree times daily . Advised patient to make sure she has Hydralazine 37m22mblets instead of Hydralazine 25mg63mlets (patient mentioned taking 1 and 1/2 tablets three times daily, but [facility] and she would need to take 3 tablets at each dose to make 75mg) 53mrify dosing of medications with patient and Cardiologist at follow up o Advised patient to take medications consistently at the same time each day o Discussed appropriate goals for blood pressure less than 130/80 . Patient self care activities - Over the next 90 days, patient will: o Check BP daily, document, and provide at future appointments o Ensure daily salt intake < 2300 mg/day o Verify strength of hydralazine at home - If 37mg- n2mto take 1 and 1/2 tablets for each dose to make 75mg - I11mmg- nee31m take 3 tablets for each dose to make 75mg  Hype49midemia Lab Results  Component Value Date/Time   LDLCALC 65 12/17/2019 11:46 AM   LDLCALC 65 10/22/2018 02:46 PM   LDLDIRECT 66.0 01/27/2015 11:04 AM   . Pharmacist Clinical  Goal(s): o Over the next 90 days, patient will work with PharmD and providers to maintain LDL goal < 70 . Current regimen:  o Atorvastatin 10mg daily 57mterventions: o Dietary recommendations provided . Patient self care activities - Over the next 90 days, patient will: o Take cholesterol medication daily as directed  Diabetes Lab Results  Component Value Date/Time   HGBA1C 7.6 (H) 11/09/2019 12:21 AM   HGBA1C 8.0 (H) 08/24/2019 03:30 PM   . Pharmacist Clinical Goal(s): o Over the next 90 days, patient will work with PharmD and providers to achieve A1c goal <7% . Current regimen:  o Humalog Kwikpen 2-4 units three times daily as needed o Ozempic 1mg weekly o21mujeo 300 units/mL 34 units at bedtime . Interventions: o Dietary recommendations provided o Discussed hypoglycemia and appropriate treatment . Patient self care activities - Over the next 90 days, patient will: o Check blood sugar 3-4 times daily, document, and provide at future appointments o Contact provider with any episodes of hypoglycemia  Medication management . Pharmacist Clinical Goal(s): o Over the next 90 days, patient  will work with PharmD and providers to maintain optimal medication adherence . Current pharmacy: [facility] . Interventions o Comprehensive medication review performed. o Medication list updated o Continue current medication management strategy . Patient self care activities - Over the next 90 days, patient will: o Focus on medication adherence by using pill box o Take medications as prescribed o Report any questions or concerns to PharmD and/or provider(s)  Please see past updates related to this goal by clicking on the "Past Updates" button in the selected goal      .  Track and Manage Symptoms-Heart Failure      Timeframe:  Long-Range Goal Priority:  High Start Date: 06/27/20                         Expected End Date:  12/25/20                    Follow Up Date: 08/08/20   -  develop a rescue plan - eat more whole grains, fruits and vegetables, lean meats and healthy fats - follow rescue plan if symptoms flare-up - know when to call the doctor - track symptoms and what helps feel better or worse  - take Jardiance as directed by Cardiologist    Why is this important?    You will be able to handle your symptoms better if you keep track of them.   Making some simple changes to your lifestyle will help.   Eating healthy is one thing you can do to take good care of yourself.    Notes:     .  Weight (lb) < 200 lb (90.7 kg) (pt-stated)      Wants to be 165 pounds    .  Work with SW to manage care coordination needs      Timeframe:  Long-Range Goal Priority:  [employer] Start Date: 2.3.22                            Expected End Date: 6.3.22                       Next planned outreach:2.21.22  Patient Goals/Self-Care Activities Over the next 30 days, patient will:   - Patient will self administer medications as prescribed Patient will attend all scheduled provider appointments Patient will call provider office for new concerns or questions Contact SW as needed prior to next scheduled call      Depression Screen PHQ 2/9 Scores 08/11/2020 06/06/2020 01/06/2020 12/21/2019 09/28/2019 08/24/2019 05/19/2019  PHQ - 2 Score 0 1 1 0 0 4 0  PHQ- 9 Score - 6 1 9  - 12 4    Fall Risk Fall Risk  08/11/2020 01/06/2020 09/28/2019 09/11/2019 08/24/2019  Falls in the past year? 1 1 1 1 1   Comment lost balance lost balance - - -  Number falls in past yr: 1 1 1 1  0  Injury with Fall? 0 0 - 1 1  Comment - - - - patient said that she hurt her right knee when she fell.  Risk Factor Category  - - - - -  Risk for fall due to : Impaired balance/gait;Impaired mobility;Medication side effect Impaired balance/gait;Medication side effect;History of fall(s) - Impaired balance/gait;Impaired mobility;Impaired vision -  Follow up Falls evaluation completed;Education provided;Falls prevention  discussed Falls evaluation completed;Education provided;Falls prevention discussed - - -  FALL RISK PREVENTION PERTAINING TO THE HOME:  Any stairs in or around the home? Yes  If so, are there any without handrails? No  Home free of loose throw rugs in walkways, pet beds, electrical cords, etc? Yes  Adequate lighting in your home to reduce risk of falls? Yes   ASSISTIVE DEVICES UTILIZED TO PREVENT FALLS:  Life alert? No  Use of a cane, walker or w/c? Yes  Grab bars in the bathroom? Yes  Shower chair or bench in shower? Yes  Elevated toilet seat or a handicapped toilet? Yes   TIMED UP AND GO:  Was the test performed? No . .    Cognitive Function:     6CIT Screen 08/11/2020 01/06/2020 10/22/2018  What Year? 0 points 0 points 0 points  What month? 0 points 0 points 0 points  What time? 3 points 0 points 0 points  Count back from 20 2 points 2 points 2 points  Months in reverse 0 points 0 points 0 points  Repeat phrase 0 points 4 points 0 points  Total Score 5 6 2     Immunizations Immunization History  Administered Date(s) Administered  . Fluad Quad(high Dose 65+) 06/06/2020  . Influenza Split 04/11/2011, 06/30/2013  . Influenza, High Dose Seasonal PF 02/14/2017, 04/24/2018  . Influenza-Unspecified 04/07/2014  . PFIZER(Purple Top)SARS-COV-2 Vaccination 08/06/2019, 08/31/2019, 05/04/2020  . Tdap 05/18/2012  . Zoster 04/14/2014    TDAP status: Up to date  Flu Vaccine status: Up to date  Pneumococcal vaccine status: Up to date  Covid-19 vaccine status: Completed vaccines  Qualifies for Shingles Vaccine? Yes   Zostavax completed Yes   Shingrix Completed?: No.    Education has been provided regarding the importance of this vaccine. Patient has been advised to call insurance company to determine out of pocket expense if they have not yet received this vaccine. Advised may also receive vaccine at local pharmacy or Health Dept. Verbalized acceptance and  understanding.  Screening Tests Health Maintenance  Topic Date Due  . FOOT EXAM  12/23/2019  . HEMOGLOBIN A1C  12/05/2020  . OPHTHALMOLOGY EXAM  02/01/2021  . MAMMOGRAM  07/21/2021  . TETANUS/TDAP  05/18/2022  . COLONOSCOPY (Pts 45-43yr Insurance coverage will need to be confirmed)  06/18/2027  . INFLUENZA VACCINE  Completed  . DEXA SCAN  Completed  . COVID-19 Vaccine  Completed  . Hepatitis C Screening  Completed  . PNA vac Low Risk Adult  Completed    Health Maintenance  Health Maintenance Due  Topic Date Due  . FOOT EXAM  12/23/2019    Colorectal cancer screening: Type of screening: Colonoscopy. Completed 06/17/2017. Repeat every 5 years  Mammogram status: Completed 07/21/2020. Repeat every year  Bone Density status: Completed 03/30/2013.   Lung Cancer Screening: (Low Dose CT Chest recommended if Age 73-80years, 30 pack-year currently smoking OR have quit w/in 15years.) does not qualify.   Lung Cancer Screening Referral: no  Additional Screening:  Hepatitis C Screening: does qualify; Completed 08/13/2010  Vision Screening: Recommended annual ophthalmology exams for early detection of glaucoma and other disorders of the eye. Is the patient up to date with their annual eye exam?  Yes  Who is the provider or what is the name of the office in which the patient attends annual eye exams? Dr. GKaty FitchIf pt is not established with a provider, would they like to be referred to a provider to establish care? No .   Dental Screening: Recommended annual dental exams for proper oral  hygiene  Community Resource Referral / Chronic Care Management: CRR required this visit?  No   CCM required this visit?  No      Plan:     I have personally reviewed and noted the following in the patient's chart:   . Medical and social history . Use of alcohol, tobacco or illicit drugs  . Current medications and supplements . Functional ability and status . Nutritional status . Physical  activity . Advanced directives . List of other physicians . Hospitalizations, surgeries, and ER visits in previous 12 months . Vitals . Screenings to include cognitive, depression, and falls . Referrals and appointments  In addition, I have reviewed and discussed with patient certain preventive protocols, quality metrics, and best practice recommendations. A written personalized care plan for preventive services as well as general preventive health recommendations were provided to patient.     [name], LPN   [id number]   Nurse Notes:

## 2020-08-11 NOTE — Patient Instructions (Signed)
[name] , Thank you for taking time to come for your Medicare Wellness Visit. I appreciate your ongoing commitment to your health goals. Please review the following plan we discussed and let me know if I can assist you in the future.   Screening recommendations/referrals: Colonoscopy: completed 06/17/2017 Mammogram: completed 07/21/2020 Bone Density: completed 03/30/2013 Recommended yearly ophthalmology/optometry visit for glaucoma screening and checkup Recommended yearly dental visit for hygiene and checkup  Vaccinations: Influenza vaccine: completed 06/06/2020, due 01/23/2021 Pneumococcal vaccine: completed 01/18/2016 Tdap vaccine: completed 05/18/2012, due 05/18/2022 Shingles vaccine: discussed   Covid-19: 05/04/2020, 08/31/2019, 08/06/2019  Advanced directives: copy in chart  Conditions/risks identified: none  Next appointment: Follow up in one year for your annual wellness visit    Preventive Care 73 Years and Older, Female Preventive care refers to lifestyle choices and visits with your health care provider that can promote health and wellness. What does preventive care include?  A yearly physical exam. This is also called an annual well check.  Dental exams once or twice a year.  Routine eye exams. Ask your health care provider how often you should have your eyes checked.  Personal lifestyle choices, including:  Daily care of your teeth and gums.  Regular physical activity.  Eating a healthy diet.  Avoiding tobacco and drug use.  Limiting alcohol use.  Practicing safe sex.  Taking low-dose aspirin every day.  Taking vitamin and mineral supplements as recommended by your health care provider. What happens during an annual well check? The services and screenings done by your health care provider during your annual well check will depend on your age, overall health, lifestyle risk factors, and family history of disease. Counseling  Your health care provider may  ask you questions about your:  Alcohol use.  Tobacco use.  Drug use.  Emotional well-being.  Home and relationship well-being.  Sexual activity.  Eating habits.  History of falls.  Memory and ability to understand (cognition).  Work and work Statistician.  Reproductive health. Screening  You may have the following tests or measurements:  Height, weight, and BMI.  Blood pressure.  Lipid and cholesterol levels. These may be checked every 5 years, or more frequently if you are over 21 years old.  Skin check.  Lung cancer screening. You may have this screening every year starting at age 73 if you have a 30-pack-year history of smoking and currently smoke or have quit within the past 15 years.  Fecal occult blood test (FOBT) of the stool. You may have this test every year starting at age 73.  Flexible sigmoidoscopy or colonoscopy. You may have a sigmoidoscopy every 5 years or a colonoscopy every 10 years starting at age 73.  Hepatitis C blood test.  Hepatitis B blood test.  Sexually transmitted disease (STD) testing.  Diabetes screening. This is done by checking your blood sugar (glucose) after you have not eaten for a while (fasting). You may have this done every 1-3 years.  Bone density scan. This is done to screen for osteoporosis. You may have this done starting at age 73.  Mammogram. This may be done every 1-2 years. Talk to your health care provider about how often you should have regular mammograms. Talk with your health care provider about your test results, treatment options, and if necessary, the need for more tests. Vaccines  Your health care provider may recommend certain vaccines, such as:  Influenza vaccine. This is recommended every year.  Tetanus, diphtheria, and acellular pertussis (Tdap, Td) vaccine.  You may need a Td booster every 10 years.  Zoster vaccine. You may need this after age 39.  Pneumococcal 13-valent conjugate (PCV13) vaccine. One  dose is recommended after age 65.  Pneumococcal polysaccharide (PPSV23) vaccine. One dose is recommended after age 73. Talk to your health care provider about which screenings and vaccines you need and how often you need them. This information is not intended to replace advice given to you by your health care provider. Make sure you discuss any questions you have with your health care provider. Document Released: 07/08/2015 Document Revised: 02/29/2016 Document Reviewed: 04/12/2015 Elsevier Interactive Patient Education  2017 [employer] Prevention in the Home Falls can cause injuries. They can happen to people of all ages. There are many things you can do to make your home safe and to help prevent falls. What can I do on the outside of my home?  Regularly fix the edges of walkways and driveways and fix any cracks.  Remove anything that might make you trip as you walk through a door, such as a raised step or threshold.  Trim any bushes or trees on the path to your home.  Use bright outdoor lighting.  Clear any walking paths of anything that might make someone trip, such as rocks or tools.  Regularly check to see if handrails are loose or broken. Make sure that both sides of any steps have handrails.  Any raised decks and porches should have guardrails on the edges.  Have any leaves, snow, or ice cleared regularly.  Use sand or salt on walking paths during winter.  Clean up any spills in your garage right away. This includes oil or grease spills. What can I do in the bathroom?  Use night lights.  Install grab bars by the toilet and in the tub and shower. Do not use towel bars as grab bars.  Use non-skid mats or decals in the tub or shower.  If you need to sit down in the shower, use a plastic, non-slip stool.  Keep the floor dry. Clean up any water that spills on the floor as soon as it happens.  Remove soap buildup in the tub or shower regularly.  Attach bath  mats securely with double-sided non-slip rug tape.  Do not have throw rugs and other things on the floor that can make you trip. What can I do in the bedroom?  Use night lights.  Make sure that you have a light by your bed that is easy to reach.  Do not use any sheets or blankets that are too big for your bed. They should not hang down onto the floor.  Have a firm chair that has side arms. You can use this for support while you get dressed.  Do not have throw rugs and other things on the floor that can make you trip. What can I do in the kitchen?  Clean up any spills right away.  Avoid walking on wet floors.  Keep items that you use a lot in easy-to-reach places.  If you need to reach something above you, use a strong step stool that has a grab bar.  Keep electrical cords out of the way.  Do not use floor polish or wax that makes floors slippery. If you must use wax, use non-skid floor wax.  Do not have throw rugs and other things on the floor that can make you trip. What can I do with my stairs?  Do not leave any  items on the stairs.  Make sure that there are handrails on both sides of the stairs and use them. Fix handrails that are broken or loose. Make sure that handrails are as long as the stairways.  Check any carpeting to make sure that it is firmly attached to the stairs. Fix any carpet that is loose or worn.  Avoid having throw rugs at the top or bottom of the stairs. If you do have throw rugs, attach them to the floor with carpet tape.  Make sure that you have a light switch at the top of the stairs and the bottom of the stairs. If you do not have them, ask someone to add them for you. What else can I do to help prevent falls?  Wear shoes that:  Do not have high heels.  Have rubber bottoms.  Are comfortable and fit you well.  Are closed at the toe. Do not wear sandals.  If you use a stepladder:  Make sure that it is fully opened. Do not climb a closed  stepladder.  Make sure that both sides of the stepladder are locked into place.  Ask someone to hold it for you, if possible.  Clearly mark and make sure that you can see:  Any grab bars or handrails.  First and last steps.  Where the edge of each step is.  Use tools that help you move around (mobility aids) if they are needed. These include:  Canes.  Walkers.  Scooters.  Crutches.  Turn on the lights when you go into a dark area. Replace any light bulbs as soon as they burn out.  Set up your furniture so you have a clear path. Avoid moving your furniture around.  If any of your floors are uneven, fix them.  If there are any pets around you, be aware of where they are.  Review your medicines with your doctor. Some medicines can make you feel dizzy. This can increase your chance of falling. Ask your doctor what other things that you can do to help prevent falls. This information is not intended to replace advice given to you by your health care provider. Make sure you discuss any questions you have with your health care provider. Document Released: 04/07/2009 Document Revised: 11/17/2015 Document Reviewed: 07/16/2014 Elsevier Interactive Patient Education  2017 [employer].

## 2020-08-15 ENCOUNTER — Ambulatory Visit: Payer: Medicare HMO

## 2020-08-15 ENCOUNTER — Other Ambulatory Visit: Payer: Self-pay | Admitting: Cardiology

## 2020-08-15 DIAGNOSIS — I5032 Chronic diastolic (congestive) heart failure: Secondary | ICD-10-CM

## 2020-08-15 DIAGNOSIS — E1122 Type 2 diabetes mellitus with diabetic chronic kidney disease: Secondary | ICD-10-CM

## 2020-08-15 DIAGNOSIS — Z794 Long term (current) use of insulin: Secondary | ICD-10-CM

## 2020-08-15 DIAGNOSIS — F339 Major depressive disorder, recurrent, unspecified: Secondary | ICD-10-CM | POA: Diagnosis not present

## 2020-08-15 DIAGNOSIS — N182 Chronic kidney disease, stage 2 (mild): Secondary | ICD-10-CM | POA: Diagnosis not present

## 2020-08-15 NOTE — Chronic Care Management (AMB) (Signed)
Chronic Care Management    Social Work Note  08/15/2020 Name: DESHANTA LADY MRN: [id number] DOB: 11-25-1945  [name] is a 75 y.o. year old female who is a primary care patient of [name], MD. The CCM team was consulted to assist the patient with chronic disease management and/or care coordination needs related to: [employer] .   Engaged with patient by telephone for follow up visit in response to provider referral for social work chronic care management and care coordination services.   Consent to Services:  The patient was given information about Chronic Care Management services, agreed to services, and gave verbal consent prior to initiation of services.  Please see initial visit note for detailed documentation.   Patient agreed to services and consent obtained.   Assessment: Review of patient past medical history, allergies, medications, and health status, including review of relevant consultants reports was performed today as part of a comprehensive evaluation and provision of chronic care management and care coordination services.     SDOH (Social Determinants of Health) assessments and interventions performed:    Advanced Directives Status: Not addressed in this encounter.  CCM Care Plan  Allergies  Allergen Reactions  . Other Anaphylaxis and Rash    Bleach  . Penicillins Hives    Did it involve swelling of the face/tongue/throat, SOB, or low BP? No Did it involve sudden or severe rash/hives, skin peeling, or any reaction on the inside of your mouth or nose? Yes Did you need to seek medical attention at a hospital or doctor's office? Yes When did it last happen?Childhood allergy  If all above answers are "NO", may proceed with cephalosporin use.    . Sulfa Antibiotics Hives  . Aspirin Other (See Comments)     On aspirin 81 mg - Rectal bleeding in dec 2018  . Codeine     Headache and makes the patient feel "off"    Outpatient Encounter  Medications as of 08/15/2020  Medication Sig  . acetaminophen (TYLENOL) 500 MG tablet Take 500-1,000 mg by mouth every 4 (four) hours as needed for moderate pain.   Marland Kitchen albuterol (VENTOLIN HFA) 108 (90 Base) MCG/ACT inhaler Inhale 1-2 puffs into the lungs every 6 (six) hours as needed for wheezing or shortness of breath.  . Alcohol Swabs (ALCOHOL PADS) 70 % PADS Use as directed dx: e11.22  . Ascorbic Acid (VITAMIN C WITH ROSE HIPS) 1000 MG tablet Take 1,000 mg by mouth daily.  Marland Kitchen atorvastatin (LIPITOR) 10 MG tablet TAKE 1 TABLET EVERY EVENING.  . B-D ULTRAFINE III SHORT PEN 31G X 8 MM MISC USE DAILY WITH VICTOZA AND/OR NOVOLOG.  . Blood Glucose Calibration (TRUE METRIX LEVEL 1) Low SOLN Use as directed  Dx:e11.22  . Blood Glucose Monitoring Suppl (TRUE METRIX METER) w/Device KIT Use as directed to check blood sugars 2 times per day dx: e11.22  . carvedilol (COREG) 25 MG tablet Take 1 tablet (25 mg total) by mouth 2 (two) times daily with a meal.  . Cholecalciferol (VITAMIN D-3) 1000 units CAPS Take 5,000 Units by mouth daily.   Marland Kitchen dexlansoprazole (DEXILANT) 60 MG capsule Take 1 capsule (60 mg total) by mouth daily. (Patient not taking: Reported on 08/11/2020)  . diclofenac sodium (VOLTAREN) 1 % GEL Apply 2 g topically 4 (four) times daily as needed (pain).  . ferrous sulfate 324 MG TBEC Take 324 mg by mouth 2 (two) times daily. (Patient not taking: Reported on 08/11/2020)  . furosemide (LASIX) 80 MG  tablet Take 1 tablet (80 mg total) by mouth daily. 43m daily Monday-Friday, 420mon Saturday and Sunday  . glucose blood (TRUE METRIX BLOOD GLUCOSE TEST) test strip Use as directed to check blood sugars 2 times per day dx: e11.22  . hydrALAZINE (APRESOLINE) 25 MG tablet Take 100 mg by mouth 3 (three) times daily.  . hydrocortisone cream 1 % Apply 1 application topically daily as needed for itching.   . insulin glargine, 2 Unit Dial, (TOUJEO MAX SOLOSTAR) 300 UNIT/ML Solostar Pen Inject 25 Units into the skin  at bedtime.  . insulin lispro (HUMALOG KWIKPEN) 100 UNIT/ML KwikPen Inject 0.02-0.04 mLs (2-4 Units total) into the skin 3 (three) times daily as needed (high blood sugar over 150).  . Isopropyl Alcohol (ALCOHOL WIPES) 70 % MISC Apply 1 each topically 2 (two) times daily.  . isosorbide mononitrate (IMDUR) 30 MG 24 hr tablet Take 1 tablet (30 mg total) by mouth daily.  . Marland KitchenamoTRIgine (LAMICTAL) 150 MG tablet Take 1 tablet (150 mg total) by mouth 2 (two) times daily.  . Marland KitchenevETIRAcetam (KEPPRA) 500 MG tablet TAKE 2 TABLETS AT BEDTIME  . Menthol, Topical Analgesic, (STOPAIN ROLL-ON EX) Apply 1 application topically daily as needed (pain).  . Marland Kitcheneomycin-bacitracin-polymyxin (NEOSPORIN) ointment Apply 1 application topically as needed for wound care.   . ondansetron (ZOFRAN) 4 MG tablet   . OZEMPIC, 1 MG/DOSE, 4 MG/3ML SOPN INJECT 1MG INTO THE SKIN ONCE A WEEK  . Pancrelipase, Lip-Prot-Amyl, (ZENPEP PO) Take by mouth.  . Pancrelipase, Lip-Prot-Amyl, 25000-79000 units CPEP Take 1-2 capsules by mouth See admin instructions. Take 2 capsules in the morning, 1 capsule with lunch, and 2 capsules in the evening.  . potassium chloride SA (KLOR-CON) 20 MEQ tablet TAKE 2 TABLETS EVERY DAY BUT WHEN TAKING METOLAZONE TAKE 3 TABLETS DAILY AS DIRECTED  . sennosides-docusate sodium (SENOKOT-S) 8.6-50 MG tablet Take 1 tablet by mouth daily.  . sertraline (ZOLOFT) 50 MG tablet TAKE 1 TABLET EVERY DAY  . tolnaftate (TINACTIN) 1 % cream Apply 1 application topically daily as needed (foot fungus).   . TRUEplus Lancets 33G MISC Use as directed to check blood sugars 2 times per day dx: e11.22  . zinc sulfate 220 (50 Zn) MG capsule Take 220 mg by mouth daily. (Patient not taking: Reported on 08/11/2020)   No facility-administered encounter medications on file as of 08/15/2020.    Patient Active Problem List   Diagnosis Date Noted  . Postinflammatory pulmonary fibrosis (HCG[place]12/15/2021  . Lung nodule 06/08/2020  . Chronic  kidney disease, stage 2 (mild) 01/05/2020  . Hypertensive chronic kidney disease with stage 1 through stage 4 chronic kidney disease, or unspecified chronic kidney disease 01/05/2020  . Snoring 01/05/2020  . Neuroleptic-induced parkinsonism (HCNorthglenn04/10/2019  . Class 3 severe obesity due to excess calories with serious comorbidity and body mass index (BMI) of 40.0 to 44.9 in adult ([facility]/27[facility]2020  . Pulmonary hypertension, unspecified (HCCovington10/05/2019  . Syncope 02/24/2019  . Rheumatoid arthritis (HCRoane07/29/2020  . Radial styloid tenosynovitis 11/24/2018  . Type 2 diabetes mellitus with stage 2 chronic kidney disease, with long-term current use of insulin (HCLead Hill01/27/2020  . Chest pain 05/15/2018  . 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 Excessive daytime sleepiness 01/30/2018  . Sleep-related hypoventilation 01/30/2018  . Recurrent depression (HCBuffalo City05/15/2019  . OSA on CPAP 10/07/2017  . Todd's paralysis (HCDemop[place]  . Chronic diastolic CHF (congestive heart failure), NYHA class  2 ([place])   . Chronic respiratory failure with hypoxia ([place])   . Acute lower GI bleeding   . Stroke-like symptoms 06/15/2017  . Rectal bleeding 06/15/2017  . Dehydration 03/07/2017  . Medication management 12/24/2016  . Near syncope 03/09/2016  . Racing heart beat 03/09/2016  . Dyspnea 01/28/2016  . Spinal stenosis at L4-L5 level 10/04/2014  . Parkinsonism ([place]) 06/24/2013  . Lower abdominal pain 10/31/2012  . Type II diabetes mellitus, uncontrolled ([place]) 10/31/2012  . Seizure disorder ([place]) 10/31/2012  . Diverticulosis 10/31/2012  . Anemia associated with acute blood loss 08/14/2012  . Hematochezia 08/14/2012  . Varicose veins of bilateral lower extremities with other complications 53/29/9242  . Leg pain 03/21/2012  . Radicular pain of left lower extremity 03/21/2012  . Heart murmur 11/21/2011  . Infected pseudocyst of pancreas 10/28/2011  .  Chronic pancreatitis ([place]) 10/28/2011  . Hypertensive heart disease with chronic diastolic congestive heart failure ([place]) 10/28/2011  . Dyslipidemia, goal LDL below 100 10/28/2011  . Morbid obesity with BMI of 45.0-49.9, adult ([place]) 10/28/2011  . Pseudoseizure 10/24/2011  . Gastroesophageal reflux 10/23/2011  . History of tobacco abuse 10/23/2011  . Hypoxemia 10/23/2011  . Disturbance in sleep behavior 09/03/2011  . Partial epilepsy with impairment of consciousness ([place]) 09/03/2011  . Tremor 09/03/2011  . Peripheral edema 04/14/2011  . Obesity hypoventilation syndrome ([place]) 10/26/2010  . Atherosclerotic heart disease of native coronary artery with other forms of angina pectoris ([place]) 08/28/2010  . Acute, but ill-defined, cerebrovascular disease 05/04/2008  . Arthropathy 05/04/2008  . Disequilibrium 05/04/2008  . Generalized tonic clonic epilepsy ([place]) 05/04/2008  . Increased frequency of urination 05/04/2008  . Insomnia, unspecified 05/04/2008  . Major depressive disorder, single episode, unspecified 05/04/2008  . Memory loss 05/04/2008  . Nausea 05/04/2008  . Other malaise and fatigue 05/04/2008  . Swelling of limb 05/04/2008    Conditions to be addressed/monitored: CHF and DMII; Family and relationship dysfunction and care coordination  Care Plan : [place]  Updates made by Daneen Schick since 08/15/2020 12:00 AM    Problem: Care Coordination     Long-Range Goal: Collaborate with RN Care Manager to assist with care coordination needs   Start Date: 07/28/2020  Expected End Date: 11/25/2020  This Visit's Progress: On track  Priority: Medium  Note:   Current Barriers:  . Family and relationship dysfunction . Social Isolation . Chronic conditions including DM II and CHF which put patient at increased risk of hospitalization  Social Work Clinical Goal(s):  Marland Kitchen Over the next 120 days, patient will work with SW to address concerns related to care  coordination  Interventions: . 1:1 collaboration with [name], MD regarding development and update of comprehensive plan of care as evidenced by provider attestation and co-signature . Inter-disciplinary care team collaboration (see longitudinal plan of care) . Successful outbound call placed to the patient to assess for care coordination needs . Discussed the patient is doing well with no acute SW needs at this time . Reviewed concerns during last SW outreach where the patient reported she would like to move out of her sisters home . Assessed for continued desire to move- patient stated "no everything is good now" . Scheduled follow up call over the next 60 days  Patient Goals/Self-Care Activities Over the next 60 days, patient will:   - Patient will self administer medications as prescribed Patient will attend all scheduled provider appointments Patient will call provider office for new concerns or questions Contact  SW as needed prior to next scheduled call  Follow up Plan: SW will follow up with patient by phone over the next 60 days       Follow Up Plan: SW will follow up with patient by phone over the next 60 days.      Daneen Schick, BSW, CDP Social Worker, Certified Dementia Practitioner [place] / [place] [facility] [phone]  Total time spent performing care coordination and/or care management activities with the patient by phone or face to face = 25 minutes.

## 2020-08-15 NOTE — Patient Instructions (Signed)
Goals we discussed today:  Goals Addressed            This Visit's Progress   . Work with SW to manage care coordination needs       Timeframe:  Long-Range Goal Priority:  [employer] Start Date: 2.3.22                            Expected End Date: 6.3.22                       Next planned outreach: 4.21.22  Patient Goals/Self-Care Activities Over the next 60 days, patient will:   - Patient will self administer medications as prescribed Patient will attend all scheduled provider appointments Patient will call provider office for new concerns or questions Contact SW as needed prior to next scheduled call

## 2020-08-16 ENCOUNTER — Other Ambulatory Visit (HOSPITAL_COMMUNITY): Payer: Self-pay | Admitting: *Deleted

## 2020-08-16 MED ORDER — FUROSEMIDE 80 MG PO TABS: 80.0000 mg | ORAL_TABLET | Freq: Every day | ORAL | 0 refills | Status: DC

## 2020-08-16 MED ORDER — HYDRALAZINE HCL 25 MG PO TABS: 100.0000 mg | ORAL_TABLET | Freq: Three times a day (TID) | ORAL | 3 refills | Status: DC

## 2020-08-18 DIAGNOSIS — R5382 Chronic fatigue, unspecified: Secondary | ICD-10-CM | POA: Diagnosis not present

## 2020-08-31 ENCOUNTER — Other Ambulatory Visit: Payer: Self-pay | Admitting: Cardiology

## 2020-08-31 ENCOUNTER — Other Ambulatory Visit: Payer: Self-pay | Admitting: Nurse Practitioner

## 2020-09-05 ENCOUNTER — Ambulatory Visit: Payer: Medicare HMO | Admitting: Internal Medicine

## 2020-09-12 ENCOUNTER — Telehealth: Payer: Self-pay

## 2020-09-12 NOTE — Telephone Encounter (Signed)
  Chronic Care Management   Outreach Note  09/12/2020 Name: [name] MRN: [id number] DOB: [birth year]  Referred by: [name], MD Reason for referral : Chronic Care Management (Inbound Call from patient )   Voice message received from patient stating she needs assistance with housing and would like to relocate from her sisters home to an ALF. Sent secure message to PCP and embedded BSW with notification of patient's request. The patient was referred to the case management team for assistance with care management and care coordination.   Follow Up Plan: Telephone follow up appointment with care management team member scheduled for: 09/14/20  Barb Merino, RN, BSN, CCM Care Management Coordinator [place] [facility]  Direct Phone: [phone]

## 2020-09-14 ENCOUNTER — Telehealth: Payer: Medicare HMO

## 2020-09-14 ENCOUNTER — Telehealth: Payer: Self-pay

## 2020-09-14 NOTE — Telephone Encounter (Signed)
  Chronic Care Management   Outreach Note  09/14/2020 Name: [name] MRN: [id number] DOB: 1945/10/06  Referred by: [name], MD Reason for referral : Chronic Care Management   An unsuccessful telephone outreach was attempted today. The patient was referred to the case management team for assistance with care management and care coordination.   Follow Up Plan: A HIPAA compliant phone message was left for the patient providing contact information and requesting a return call.  The care management team will reach out to the patient again over the next 7 days.   Daneen Schick, BSW, CDP Social Worker, Certified Dementia Practitioner [place] / [place] [facility] [phone]

## 2020-09-16 ENCOUNTER — Ambulatory Visit (INDEPENDENT_AMBULATORY_CARE_PROVIDER_SITE_OTHER): Payer: Medicare HMO

## 2020-09-16 DIAGNOSIS — Z794 Long term (current) use of insulin: Secondary | ICD-10-CM | POA: Diagnosis not present

## 2020-09-16 DIAGNOSIS — E1122 Type 2 diabetes mellitus with diabetic chronic kidney disease: Secondary | ICD-10-CM | POA: Diagnosis not present

## 2020-09-16 DIAGNOSIS — N182 Chronic kidney disease, stage 2 (mild): Secondary | ICD-10-CM | POA: Diagnosis not present

## 2020-09-16 DIAGNOSIS — I5032 Chronic diastolic (congestive) heart failure: Secondary | ICD-10-CM

## 2020-09-16 NOTE — Patient Instructions (Signed)
Social Worker Visit Information  Goals we discussed today:  Goals Addressed            This Visit's Progress   . Effective Long Term Care Planning       Timeframe:  Short-Term Goal Priority:  High Start Date:    3.25.22                         Expected End Date: 3.29.22                      Next planned outreach: 3.29.22  Patient Goals/Self-Care Activities . Over the next 10 days, patient will:   - Patient will self administer medications as prescribed Patient will call provider office for new concerns or questions Contact SW as needed prior to next scheduled call        Materials Provided: Yes: Verbal and written education on long term care Medicaid provided  Follow Up Plan: SW will follow up with patient by phone over the next week  Daneen Schick, BSW, CDP Social Worker, Certified Dementia Practitioner [place] / [place] [facility] [phone]

## 2020-09-16 NOTE — Chronic Care Management (AMB) (Signed)
Chronic Care Management    Social Work Note  09/16/2020 Name: [name] MRN: [id number] DOB: October 02, 1945  [name] is a 75 y.o. year old female who is a primary care patient of [name], MD. The CCM team was consulted to assist the patient with chronic disease management and/or care coordination needs related to: [employer]  and Family and Relationship Dysfunction..   Engaged with patient by telephone for follow up visit in response to provider referral for social work chronic care management and care coordination services.   Consent to Services:  The patient was given information about Chronic Care Management services, agreed to services, and gave verbal consent prior to initiation of services.  Please see initial visit note for detailed documentation.   Patient agreed to services and consent obtained.   Assessment: Review of patient past medical history, allergies, medications, and health status, including review of relevant consultants reports was performed today as part of a comprehensive evaluation and provision of chronic care management and care coordination services.     SDOH (Social Determinants of Health) assessments and interventions performed:    Advanced Directives Status: Not addressed in this encounter.  CCM Care Plan  Allergies  Allergen Reactions  . Other Anaphylaxis and Rash    Bleach  . Penicillins Hives    Did it involve swelling of the face/tongue/throat, SOB, or low BP? No Did it involve sudden or severe rash/hives, skin peeling, or any reaction on the inside of your mouth or nose? Yes Did you need to seek medical attention at a hospital or doctor's office? Yes When did it last happen?Childhood allergy  If all above answers are "NO", may proceed with cephalosporin use.    . Sulfa Antibiotics Hives  . Aspirin Other (See Comments)     On aspirin 81 mg - Rectal bleeding in dec 2018  . Codeine     Headache and makes the  patient feel "off"    Outpatient Encounter Medications as of 09/16/2020  Medication Sig  . acetaminophen (TYLENOL) 500 MG tablet Take 500-1,000 mg by mouth every 4 (four) hours as needed for moderate pain.   Marland Kitchen albuterol (VENTOLIN HFA) 108 (90 Base) MCG/ACT inhaler Inhale 1-2 puffs into the lungs every 6 (six) hours as needed for wheezing or shortness of breath.  . Alcohol Swabs (B-D SINGLE USE SWABS REGULAR) PADS USE TWICE DAILY AS DIRECTED  . Ascorbic Acid (VITAMIN C WITH ROSE HIPS) 1000 MG tablet Take 1,000 mg by mouth daily.  Marland Kitchen atorvastatin (LIPITOR) 10 MG tablet TAKE 1 TABLET EVERY EVENING.  . B-D ULTRAFINE III SHORT PEN 31G X 8 MM MISC USE DAILY WITH VICTOZA AND/OR NOVOLOG.  . Blood Glucose Calibration (TRUE METRIX LEVEL 1) Low SOLN Use as directed  Dx:e11.22  . Blood Glucose Monitoring Suppl (TRUE METRIX METER) w/Device KIT Use as directed to check blood sugars 2 times per day dx: e11.22  . carvedilol (COREG) 25 MG tablet TAKE 1 TABLET TWICE DAILY WITH A MEAL  . Cholecalciferol (VITAMIN D-3) 1000 units CAPS Take 5,000 Units by mouth daily.   Marland Kitchen dexlansoprazole (DEXILANT) 60 MG capsule Take 1 capsule (60 mg total) by mouth daily. (Patient not taking: Reported on 08/11/2020)  . diclofenac sodium (VOLTAREN) 1 % GEL Apply 2 g topically 4 (four) times daily as needed (pain).  . ferrous sulfate 324 MG TBEC Take 324 mg by mouth 2 (two) times daily. (Patient not taking: Reported on 08/11/2020)  . furosemide (LASIX) 80 MG tablet  Take 1 tablet (80 mg total) by mouth daily. 92m daily Monday-Friday, 434mon Saturday and Sunday  . glucose blood (TRUE METRIX BLOOD GLUCOSE TEST) test strip Use as directed to check blood sugars 2 times per day dx: e11.22  . hydrALAZINE (APRESOLINE) 25 MG tablet Take 4 tablets (100 mg total) by mouth 3 (three) times daily.  . hydrocortisone cream 1 % Apply 1 application topically daily as needed for itching.   . insulin glargine, 2 Unit Dial, (TOUJEO MAX SOLOSTAR) 300  UNIT/ML Solostar Pen Inject 25 Units into the skin at bedtime.  . insulin lispro (HUMALOG KWIKPEN) 100 UNIT/ML KwikPen Inject 0.02-0.04 mLs (2-4 Units total) into the skin 3 (three) times daily as needed (high blood sugar over 150).  . Isopropyl Alcohol (ALCOHOL WIPES) 70 % MISC Apply 1 each topically 2 (two) times daily.  . isosorbide mononitrate (IMDUR) 30 MG 24 hr tablet Take 1 tablet (30 mg total) by mouth daily.  . Marland KitchenamoTRIgine (LAMICTAL) 150 MG tablet Take 1 tablet (150 mg total) by mouth 2 (two) times daily.  . Marland KitchenevETIRAcetam (KEPPRA) 500 MG tablet TAKE 2 TABLETS AT BEDTIME  . Menthol, Topical Analgesic, (STOPAIN ROLL-ON EX) Apply 1 application topically daily as needed (pain).  . Marland Kitcheneomycin-bacitracin-polymyxin (NEOSPORIN) ointment Apply 1 application topically as needed for wound care.   . ondansetron (ZOFRAN) 4 MG tablet   . OZEMPIC, 1 MG/DOSE, 4 MG/3ML SOPN INJECT 1MG INTO THE SKIN ONCE A WEEK  . Pancrelipase, Lip-Prot-Amyl, (ZENPEP PO) Take by mouth.  . Pancrelipase, Lip-Prot-Amyl, 25000-79000 units CPEP Take 1-2 capsules by mouth See admin instructions. Take 2 capsules in the morning, 1 capsule with lunch, and 2 capsules in the evening.  . potassium chloride SA (KLOR-CON) 20 MEQ tablet TAKE 2 TABLETS EVERY DAY BUT WHEN TAKING METOLAZONE TAKE 3 TABLETS DAILY AS DIRECTED  . sennosides-docusate sodium (SENOKOT-S) 8.6-50 MG tablet Take 1 tablet by mouth daily.  . sertraline (ZOLOFT) 50 MG tablet TAKE 1 TABLET EVERY DAY  . tolnaftate (TINACTIN) 1 % cream Apply 1 application topically daily as needed (foot fungus).   . TRUEplus Lancets 33G MISC Use as directed to check blood sugars 2 times per day dx: e11.22  . zinc sulfate 220 (50 Zn) MG capsule Take 220 mg by mouth daily. (Patient not taking: Reported on 08/11/2020)   No facility-administered encounter medications on file as of 09/16/2020.    Patient Active Problem List   Diagnosis Date Noted  . Postinflammatory pulmonary fibrosis (HCOmaha 06/08/2020  . Lung nodule 06/08/2020  . Chronic kidney disease, stage 2 (mild) 01/05/2020  . Hypertensive chronic kidney disease with stage 1 through stage 4 chronic kidney disease, or unspecified chronic kidney disease 01/05/2020  . Snoring 01/05/2020  . Neuroleptic-induced parkinsonism (HCCharco04/10/2019  . Class 3 severe obesity due to excess calories with serious comorbidity and body mass index (BMI) of 40.0 to 44.9 in adult ([facility]11/27/2020  . Pulmonary hypertension, unspecified (HCLyman10/05/2019  . Syncope 02/24/2019  . Rheumatoid arthritis (HCViola07/29/2020  . Radial styloid tenosynovitis 11/24/2018  . Type 2 diabetes mellitus with stage 2 chronic kidney disease, with long-term current use of insulin (HCHampton01/27/2020  . Chest pain 05/15/2018  . 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 Excessive daytime sleepiness 01/30/2018  . Sleep-related hypoventilation 01/30/2018  . Recurrent depression (HCCedar Rapids05/15/2019  . OSA on CPAP 10/07/2017  . Todd's paralysis (HCStamping Ground  . Chronic diastolic CHF (congestive heart failure),  NYHA class 2 ([place])   . Chronic respiratory failure with hypoxia ([place])   . Acute lower GI bleeding   . Stroke-like symptoms 06/15/2017  . Rectal bleeding 06/15/2017  . Dehydration 03/07/2017  . Medication management 12/24/2016  . Near syncope 03/09/2016  . Racing heart beat 03/09/2016  . Dyspnea 01/28/2016  . Spinal stenosis at L4-L5 level 10/04/2014  . Parkinsonism ([place]) 06/24/2013  . Lower abdominal pain 10/31/2012  . Type II diabetes mellitus, uncontrolled ([place]) 10/31/2012  . Seizure disorder ([place]) 10/31/2012  . Diverticulosis 10/31/2012  . Anemia associated with acute blood loss 08/14/2012  . Hematochezia 08/14/2012  . Varicose veins of bilateral lower extremities with other complications 26/20/3559  . Leg pain 03/21/2012  . Radicular pain of left lower extremity 03/21/2012  . Heart murmur 11/21/2011  .  Infected pseudocyst of pancreas 10/28/2011  . Chronic pancreatitis ([place]) 10/28/2011  . Hypertensive heart disease with chronic diastolic congestive heart failure ([place]) 10/28/2011  . Dyslipidemia, goal LDL below 100 10/28/2011  . Morbid obesity with BMI of 45.0-49.9, adult ([place]) 10/28/2011  . Pseudoseizure 10/24/2011  . Gastroesophageal reflux 10/23/2011  . History of tobacco abuse 10/23/2011  . Hypoxemia 10/23/2011  . Disturbance in sleep behavior 09/03/2011  . Partial epilepsy with impairment of consciousness ([place]) 09/03/2011  . Tremor 09/03/2011  . Peripheral edema 04/14/2011  . Obesity hypoventilation syndrome ([place]) 10/26/2010  . Atherosclerotic heart disease of native coronary artery with other forms of angina pectoris ([place]) 08/28/2010  . Acute, but ill-defined, cerebrovascular disease 05/04/2008  . Arthropathy 05/04/2008  . Disequilibrium 05/04/2008  . Generalized tonic clonic epilepsy ([place]) 05/04/2008  . Increased frequency of urination 05/04/2008  . Insomnia, unspecified 05/04/2008  . Major depressive disorder, single episode, unspecified 05/04/2008  . Memory loss 05/04/2008  . Nausea 05/04/2008  . Other malaise and fatigue 05/04/2008  . Swelling of limb 05/04/2008    Conditions to be addressed/monitored: CHF and DMII; Housing barriers and Family and relationship dysfunction  Care Plan : [facility] Social Work Care Plan  Updates made by Daneen Schick since 09/16/2020 12:00 AM    Problem: Long-Term Care Planning     Goal: Effective Long-Term Care Planning   Start Date: 09/16/2020  Expected End Date: 10/31/2020  Priority: High  Note:   Current Barriers:  . Chronic disease management support and education needs related to CHF and DM  . Limited social support . Limited knowledge of how to obtain placement . Unable to stay in current living situation due to family relationship dysfunction  Social Worker Clinical Goal(s):  Marland Kitchen Over the next 45 days the patient will work with SW  to identify ALF placement opportunities  CCM SW Interventions:  . Inter-disciplinary care team collaboration (see longitudinal plan of care) . Collaboration with [name], MD regarding development and update of comprehensive plan of care as evidenced by provider attestation and co-signature . In basket message received from the patients primary provider who indicates the patient is in need of assistance with placement . Successful outbound call placed to the patient to assess for care coordination needs . Discussed the patient and her sister, whom she currently lives with, are not getting along. The patient reports that on 3.17.22 she was told she had 30 days to find an alternative place to live . Determined the patient is interested in moving into an assisted living facility . Assessed for patient ability to afford ALF - patient is unable to privately pay for placement and would like to apply for special  assistance . Advised the patient that in order to apply for special assistance and locate an ALF with a SA bed may take longer than 30 days . Assessed for alternative options for the patient - patient reports she has 2 sons. One son is not speaking with her at this time, the other does not have room for the patient to live with him . Determined the patient is currently receiving Medicaid MQ-B . Unsuccessful outbound call placed to [place] - voice message left requesting a return call . Mailed a Medicaid application to the patients home for completion . Scheduled follow up call over the next 10 days  Patient Goals/Self-Care Activities . Over the next 10 days, patient will:   - Patient will self administer medications as prescribed Patient will call provider office for new concerns or questions Contact SW as needed prior to next scheduled call  Follow Up Plan:  SW will follow up with the patient over the next week       Follow Up Plan: SW will follow up with patient by  phone over the next week.      Daneen Schick, BSW, CDP Social Worker, Certified Dementia Practitioner [place] [facility] [place] [facility] [phone]  Total time spent performing care coordination and/or care management activities with the patient by phone or face to face = 63 minutes.

## 2020-09-20 ENCOUNTER — Ambulatory Visit: Payer: Medicare HMO

## 2020-09-20 DIAGNOSIS — E1122 Type 2 diabetes mellitus with diabetic chronic kidney disease: Secondary | ICD-10-CM

## 2020-09-20 DIAGNOSIS — I5032 Chronic diastolic (congestive) heart failure: Secondary | ICD-10-CM

## 2020-09-20 NOTE — Chronic Care Management (AMB) (Signed)
Chronic Care Management    Social Work Note  09/20/2020 Name: [name] MRN: [id number] DOB: 03-24-1946  [name] is a 75 y.o. year old female who is a primary care patient of [name], MD. The CCM team was consulted to assist the patient with chronic disease management and/or care coordination needs related to: housing.   Engaged with patient by telephone for follow up visit in response to provider referral for social work chronic care management and care coordination services.   Consent to Services:  The patient was given information about Chronic Care Management services, agreed to services, and gave verbal consent prior to initiation of services.  Please see initial visit note for detailed documentation.   Patient agreed to services and consent obtained.   Assessment: Review of patient past medical history, allergies, medications, and health status, including review of relevant consultants reports was performed today as part of a comprehensive evaluation and provision of chronic care management and care coordination services.     SDOH (Social Determinants of Health) assessments and interventions performed:    Advanced Directives Status: Not addressed in this encounter.  CCM Care Plan  Allergies  Allergen Reactions  . Other Anaphylaxis and Rash    Bleach  . Penicillins Hives    Did it involve swelling of the face/tongue/throat, SOB, or low BP? No Did it involve sudden or severe rash/hives, skin peeling, or any reaction on the inside of your mouth or nose? Yes Did you need to seek medical attention at a hospital or doctor's office? Yes When did it last happen?Childhood allergy  If all above answers are "NO", may proceed with cephalosporin use.    . Sulfa Antibiotics Hives  . Aspirin Other (See Comments)     On aspirin 81 mg - Rectal bleeding in dec 2018  . Codeine     Headache and makes the patient feel "off"    Outpatient Encounter Medications as  of 09/20/2020  Medication Sig  . acetaminophen (TYLENOL) 500 MG tablet Take 500-1,000 mg by mouth every 4 (four) hours as needed for moderate pain.   Marland Kitchen albuterol (VENTOLIN HFA) 108 (90 Base) MCG/ACT inhaler Inhale 1-2 puffs into the lungs every 6 (six) hours as needed for wheezing or shortness of breath.  . Alcohol Swabs (B-D SINGLE USE SWABS REGULAR) PADS USE TWICE DAILY AS DIRECTED  . Ascorbic Acid (VITAMIN C WITH ROSE HIPS) 1000 MG tablet Take 1,000 mg by mouth daily.  Marland Kitchen atorvastatin (LIPITOR) 10 MG tablet TAKE 1 TABLET EVERY EVENING.  . B-D ULTRAFINE III SHORT PEN 31G X 8 MM MISC USE DAILY WITH VICTOZA AND/OR NOVOLOG.  . Blood Glucose Calibration (TRUE METRIX LEVEL 1) Low SOLN Use as directed  Dx:e11.22  . Blood Glucose Monitoring Suppl (TRUE METRIX METER) w/Device KIT Use as directed to check blood sugars 2 times per day dx: e11.22  . carvedilol (COREG) 25 MG tablet TAKE 1 TABLET TWICE DAILY WITH A MEAL  . Cholecalciferol (VITAMIN D-3) 1000 units CAPS Take 5,000 Units by mouth daily.   Marland Kitchen dexlansoprazole (DEXILANT) 60 MG capsule Take 1 capsule (60 mg total) by mouth daily. (Patient not taking: Reported on 08/11/2020)  . diclofenac sodium (VOLTAREN) 1 % GEL Apply 2 g topically 4 (four) times daily as needed (pain).  . ferrous sulfate 324 MG TBEC Take 324 mg by mouth 2 (two) times daily. (Patient not taking: Reported on 08/11/2020)  . furosemide (LASIX) 80 MG tablet Take 1 tablet (80 mg total) by  mouth daily. 76m daily Monday-Friday, 472mon Saturday and Sunday  . glucose blood (TRUE METRIX BLOOD GLUCOSE TEST) test strip Use as directed to check blood sugars 2 times per day dx: e11.22  . hydrALAZINE (APRESOLINE) 25 MG tablet Take 4 tablets (100 mg total) by mouth 3 (three) times daily.  . hydrocortisone cream 1 % Apply 1 application topically daily as needed for itching.   . insulin glargine, 2 Unit Dial, (TOUJEO MAX SOLOSTAR) 300 UNIT/ML Solostar Pen Inject 25 Units into the skin at bedtime.  .  insulin lispro (HUMALOG KWIKPEN) 100 UNIT/ML KwikPen Inject 0.02-0.04 mLs (2-4 Units total) into the skin 3 (three) times daily as needed (high blood sugar over 150).  . Isopropyl Alcohol (ALCOHOL WIPES) 70 % MISC Apply 1 each topically 2 (two) times daily.  . isosorbide mononitrate (IMDUR) 30 MG 24 hr tablet Take 1 tablet (30 mg total) by mouth daily.  . Marland KitchenamoTRIgine (LAMICTAL) 150 MG tablet Take 1 tablet (150 mg total) by mouth 2 (two) times daily.  . Marland KitchenevETIRAcetam (KEPPRA) 500 MG tablet TAKE 2 TABLETS AT BEDTIME  . Menthol, Topical Analgesic, (STOPAIN ROLL-ON EX) Apply 1 application topically daily as needed (pain).  . Marland Kitcheneomycin-bacitracin-polymyxin (NEOSPORIN) ointment Apply 1 application topically as needed for wound care.   . ondansetron (ZOFRAN) 4 MG tablet   . OZEMPIC, 1 MG/DOSE, 4 MG/3ML SOPN INJECT 1MG INTO THE SKIN ONCE A WEEK  . Pancrelipase, Lip-Prot-Amyl, (ZENPEP PO) Take by mouth.  . Pancrelipase, Lip-Prot-Amyl, 25000-79000 units CPEP Take 1-2 capsules by mouth See admin instructions. Take 2 capsules in the morning, 1 capsule with lunch, and 2 capsules in the evening.  . potassium chloride SA (KLOR-CON) 20 MEQ tablet TAKE 2 TABLETS EVERY DAY BUT WHEN TAKING METOLAZONE TAKE 3 TABLETS DAILY AS DIRECTED  . sennosides-docusate sodium (SENOKOT-S) 8.6-50 MG tablet Take 1 tablet by mouth daily.  . sertraline (ZOLOFT) 50 MG tablet TAKE 1 TABLET EVERY DAY  . tolnaftate (TINACTIN) 1 % cream Apply 1 application topically daily as needed (foot fungus).   . TRUEplus Lancets 33G MISC Use as directed to check blood sugars 2 times per day dx: e11.22  . zinc sulfate 220 (50 Zn) MG capsule Take 220 mg by mouth daily. (Patient not taking: Reported on 08/11/2020)   No facility-administered encounter medications on file as of 09/20/2020.    Patient Active Problem List   Diagnosis Date Noted  . Postinflammatory pulmonary fibrosis (HCZanesfield12/15/2021  . Lung nodule 06/08/2020  . Chronic kidney disease,  stage 2 (mild) 01/05/2020  . Hypertensive chronic kidney disease with stage 1 through stage 4 chronic kidney disease, or unspecified chronic kidney disease 01/05/2020  . Snoring 01/05/2020  . Neuroleptic-induced parkinsonism (HCGolinda04/10/2019  . Class 3 severe obesity due to excess calories with serious comorbidity and body mass index (BMI) of 40.0 to 44.9 in adult ([facility]/27/2020  . Pulmonary hypertension, unspecified (HCRantoul10/05/2019  . Syncope 02/24/2019  . Rheumatoid arthritis (HCCrump07/29/2020  . Radial styloid tenosynovitis 11/24/2018  . Type 2 diabetes mellitus with stage 2 chronic kidney disease, with long-term current use of insulin (HCEmbden01/27/2020  . Chest pain 05/15/2018  . 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 Excessive daytime sleepiness 01/30/2018  . Sleep-related hypoventilation 01/30/2018  . Recurrent depression (HCBush05/15/2019  . OSA on CPAP 10/07/2017  . Todd's paralysis (HCWhitewater  . Chronic diastolic CHF (congestive heart failure), NYHA class 2 (HCLearned  .  Chronic respiratory failure with hypoxia ([place])   . Acute lower GI bleeding   . Stroke-like symptoms 06/15/2017  . Rectal bleeding 06/15/2017  . Dehydration 03/07/2017  . Medication management 12/24/2016  . Near syncope 03/09/2016  . Racing heart beat 03/09/2016  . Dyspnea 01/28/2016  . Spinal stenosis at L4-L5 level 10/04/2014  . Parkinsonism ([place]) 06/24/2013  . Lower abdominal pain 10/31/2012  . Type II diabetes mellitus, uncontrolled ([place]) 10/31/2012  . Seizure disorder ([place]) 10/31/2012  . Diverticulosis 10/31/2012  . Anemia associated with acute blood loss 08/14/2012  . Hematochezia 08/14/2012  . Varicose veins of bilateral lower extremities with other complications 12/75/1700  . Leg pain 03/21/2012  . Radicular pain of left lower extremity 03/21/2012  . Heart murmur 11/21/2011  . Infected pseudocyst of pancreas 10/28/2011  . Chronic  pancreatitis ([place]) 10/28/2011  . Hypertensive heart disease with chronic diastolic congestive heart failure ([place]) 10/28/2011  . Dyslipidemia, goal LDL below 100 10/28/2011  . Morbid obesity with BMI of 45.0-49.9, adult ([place]) 10/28/2011  . Pseudoseizure 10/24/2011  . Gastroesophageal reflux 10/23/2011  . History of tobacco abuse 10/23/2011  . Hypoxemia 10/23/2011  . Disturbance in sleep behavior 09/03/2011  . Partial epilepsy with impairment of consciousness ([place]) 09/03/2011  . Tremor 09/03/2011  . Peripheral edema 04/14/2011  . Obesity hypoventilation syndrome ([place]) 10/26/2010  . Atherosclerotic heart disease of native coronary artery with other forms of angina pectoris ([place]) 08/28/2010  . Acute, but ill-defined, cerebrovascular disease 05/04/2008  . Arthropathy 05/04/2008  . Disequilibrium 05/04/2008  . Generalized tonic clonic epilepsy ([place]) 05/04/2008  . Increased frequency of urination 05/04/2008  . Insomnia, unspecified 05/04/2008  . Major depressive disorder, single episode, unspecified 05/04/2008  . Memory loss 05/04/2008  . Nausea 05/04/2008  . Other malaise and fatigue 05/04/2008  . Swelling of limb 05/04/2008    Conditions to be addressed/monitored: CHF and DMII; Family and relationship dysfunction  Care Plan : [facility] Social Work Care Plan  Updates made by Daneen Schick since 09/20/2020 12:00 AM    Problem: Long-Term Care Planning     Goal: Effective Long-Term Care Planning   Start Date: 09/16/2020  Expected End Date: 10/31/2020  This Visit's Progress: Not on track  Priority: High  Note:   Current Barriers:  . Chronic disease management support and education needs related to CHF and DM  . Limited social support . Limited knowledge of how to obtain placement . Unable to stay in current living situation due to family relationship dysfunction  Social Worker Clinical Goal(s):  Marland Kitchen Over the next 45 days the patient will work with SW to identify ALF placement  opportunities  CCM SW Interventions:  . Inter-disciplinary care team collaboration (see longitudinal plan of care) . Collaboration with [name], MD regarding development and update of comprehensive plan of care as evidenced by provider attestation and co-signature . Voice message received from [facility] caseworker who indicates if the patient would like special assistance Medicaid to cover the cost of ALF, the patient will need to contact DSS to speak with her caseworker and complete application . Successful outbound call placed to the patient to assist with goal progression . Advised the patient of information SW received from DSS on how to move forward with placement . Determined the patient is not longer interested in placement - patient stated "I have never had to share a room with anyone before and I do not want to do that. I also do not want to use my money to pay  for placement because I have bills to pay" . Discussed an alternative option for the patient would be to move into her own apartment - patient reports she has not looked for apartments as this time as she "does not know how to look" . Assessed for patient monthly budget to cover the cost of an apartment - the patient reports after her bills she has less than $200 left over each month. Patient stated "I can't afford to pay anything for an apartment" . Determined the patient has yet to speak with her sister about these barriers  . Encouraged the patient to speak with her sister regarding barriers to placement and affording to live on her own as the 30 day deadline for the patient to move out is quickly approaching - patient stated understanding . SW attempted to assist the patient with transportation to [place] [facility] appointments - patient declined assistance stating "If you do not hear from me by tomorrow my sister will take me" . Collaboration with RN Care Manager to discuss outcome of patient call and barriers SW is facing to  provide placement/housing resource needs to the patient as she has declined options  Patient Goals/Self-Care Activities . Over the next 21 days, patient will:   - Patient will self administer medications as prescribed Patient will attend all scheduled provider appointments Patient will call provider office for new concerns or questions Contact SW is transportation is needed to [place] [facility] appointments Communicate barriers to alternative living options with her sister to discuss plan  Follow Up Plan:  SW will follow up with the patient over the next month       Follow Up Plan: SW will follow up with patient by phone over the next month.      Daneen Schick, BSW, CDP Social Worker, Certified Dementia Practitioner [place] [facility] [place] [facility] [phone]  Total time spent performing care coordination and/or care management activities with the patient by phone or face to face = 45 minutes.

## 2020-09-20 NOTE — Patient Instructions (Signed)
Social Worker Visit Information  Goals we discussed today:  Goals Addressed            This Visit's Progress   . Effective Long Term Care Planning   Not on track    Timeframe:  Short-Term Goal Priority:  High Start Date:    3.25.22                         Expected End Date: 3.29.22                      Next planned outreach: 4.21.22   Patient Goals/Self-Care Activities . Over the next 21 days, patient will:   - Patient will self administer medications as prescribed Patient will attend all scheduled provider appointments Patient will call provider office for new concerns or questions Contact SW is transportation is needed to [place] [facility] appointments Communicate barriers to alternative living options with her sister to discuss plan        Materials Provided: No: Patient declined  Follow Up Plan: SW will follow up with patient by phone over the next month.   Daneen Schick, BSW, CDP Social Worker, Certified Dementia Practitioner [place] / [place] [facility] [phone]

## 2020-09-26 ENCOUNTER — Telehealth: Payer: Self-pay

## 2020-09-26 ENCOUNTER — Encounter: Payer: Self-pay | Admitting: Internal Medicine

## 2020-09-26 ENCOUNTER — Ambulatory Visit (INDEPENDENT_AMBULATORY_CARE_PROVIDER_SITE_OTHER): Payer: Medicare HMO

## 2020-09-26 ENCOUNTER — Other Ambulatory Visit: Payer: Self-pay

## 2020-09-26 ENCOUNTER — Ambulatory Visit (INDEPENDENT_AMBULATORY_CARE_PROVIDER_SITE_OTHER): Payer: Medicare HMO | Admitting: Internal Medicine

## 2020-09-26 VITALS — BP 118/60 | HR 68 | Temp 98.3°F | Ht 59.4 in | Wt 198.0 lb

## 2020-09-26 DIAGNOSIS — Z79899 Other long term (current) drug therapy: Secondary | ICD-10-CM

## 2020-09-26 DIAGNOSIS — Z860100 Personal history of colon polyps, unspecified: Secondary | ICD-10-CM

## 2020-09-26 DIAGNOSIS — I5032 Chronic diastolic (congestive) heart failure: Secondary | ICD-10-CM

## 2020-09-26 DIAGNOSIS — N182 Chronic kidney disease, stage 2 (mild): Secondary | ICD-10-CM | POA: Diagnosis not present

## 2020-09-26 DIAGNOSIS — G40909 Epilepsy, unspecified, not intractable, without status epilepticus: Secondary | ICD-10-CM | POA: Diagnosis not present

## 2020-09-26 DIAGNOSIS — Z6839 Body mass index (BMI) 39.0-39.9, adult: Secondary | ICD-10-CM

## 2020-09-26 DIAGNOSIS — I130 Hypertensive heart and chronic kidney disease with heart failure and stage 1 through stage 4 chronic kidney disease, or unspecified chronic kidney disease: Secondary | ICD-10-CM | POA: Diagnosis not present

## 2020-09-26 DIAGNOSIS — Z794 Long term (current) use of insulin: Secondary | ICD-10-CM | POA: Diagnosis not present

## 2020-09-26 DIAGNOSIS — J449 Chronic obstructive pulmonary disease, unspecified: Secondary | ICD-10-CM | POA: Diagnosis not present

## 2020-09-26 DIAGNOSIS — E1122 Type 2 diabetes mellitus with diabetic chronic kidney disease: Secondary | ICD-10-CM

## 2020-09-26 NOTE — Patient Instructions (Signed)
Social Worker Visit Information  Goals we discussed today:  Goals Addressed            This Visit's Progress   . Transportation Barriers to Treatment Identified and Managed       Timeframe:  Short-Term Goal Priority:  High Start Date:  4.4.22                           Expected End Date: 5.19.22                       Next planned outreach 4.21.22  Patient Goals/Self-Care Activities . Over the next 30 days, patient will:   - Patient will attend all scheduled provider appointments Work with SW to obtain SCAT approval         Follow Up Plan: SW will follow up with patient by phone over the next month   Daneen Schick, BSW, CDP Social Worker, Certified Dementia Practitioner [place] / [place] [facility] [phone]

## 2020-09-26 NOTE — Progress Notes (Signed)
I,[name],acting as a Education administrator for [name], MD.,have documented all relevant documentation on the behalf of [name], MD,as directed by  [name], MD while in the presence of [name], MD.  This visit occurred during the SARS-CoV-2 public health emergency.  Safety protocols were in place, including screening questions prior to the visit, additional usage of staff PPE, and extensive cleaning of exam room while observing appropriate contact time as indicated for disinfecting solutions.  Subjective:     Patient ID: [name] , female    DOB: [birth year] , 75 y.o.   MRN: [id number]   Chief Complaint  Patient presents with  . Diabetes  . Hypertension    HPI  She is here today for a diabetes and BP check. She reports compliance with meds. She denies headaches, chest pain and palpitations. She does not have any other concerns at this time. She is still living with her sister. She is considering different housing because of the tension in the home.   Diabetes She presents for her follow-up diabetic visit. She has type 2 diabetes mellitus. Her disease course has been improving. There are no hypoglycemic associated symptoms. Pertinent negatives for diabetes include no blurred vision and no chest pain. There are no hypoglycemic complications. Diabetic complications include nephropathy. Risk factors for coronary artery disease include diabetes mellitus, dyslipidemia, hypertension, obesity, post-menopausal and sedentary lifestyle. She is compliant with treatment some of the time. Her weight is fluctuating minimally. She is following a generally healthy diet. She never participates in exercise. Her home blood glucose trend is decreasing steadily. Her breakfast blood glucose is taken between 8-9 am. Her breakfast blood glucose range is generally 130-140 mg/dl. An ACE inhibitor/angiotensin II receptor blocker is being taken. Eye exam is not current.  Hypertension This is  a chronic problem. The current episode started more than 1 year ago. The problem has been gradually improving since onset. The problem is controlled. Pertinent negatives include no blurred vision, chest pain, palpitations or shortness of breath. The current treatment provides moderate improvement. Hypertensive end-organ damage includes kidney disease. Identifiable causes of hypertension include sleep apnea.     Past Medical History:  Diagnosis Date  . Abnormal liver function     in the past.  . Anemia   . Arthritis    all over  . Bruises easily   . Cataract    right eye;immature  . CHF (congestive heart failure) ([place])   . Chronic back pain    stenosis  . Chronic cough   . Chronic kidney disease   . COPD (chronic obstructive pulmonary disease) ([place])   . COVID    aug/sept 2021  . Demand myocardial infarction [facility]) 2012   Demand Infarction in setting of Pancreatitis --> mild Troponin elevation, NON-OBSTRUCTIVE CAD  . Depression    takes Abilify daily as well as Zoloft  . Diastolic heart failure 3212   Grade 1 diastolic Dysfunction by Echo   . Diverticulosis   . DM (diabetes mellitus) ([place])    takes Victoza daily as well as Lantus and Humalog  . Empty sella ([place])    on MRI in 2009.  . Glaucoma   . Headache(784.0)    last migraine-4-75yr ago  . History of blood transfusion    no abnormal reaction noted  . History of colon polyps    benign  . HTN (hypertension)    takes B[employer] Energydaily  . Hyperlipidemia    takes Lipitor daily  .  Joint swelling   . Nocturia   . Obesity hypoventilation syndrome ([place])   . Obstructive sleep apnea 02/2018   Notably improved split-night study with weight loss from 270 (2017) down to 250 pounds (2019).  . Pancreatitis    takes Pancrelipase daily  . Parkinson's disease ([place])    takes Sinemet daily  . Peripheral neuropathy   . Pneumonia 2012  . Seizures ([place])    takes Lamictal daily and Primidone nightly;last seizure 2wks ago   . Urinary urgency    With increased frequency  . Varicose veins of both lower extremities with pain    With edema.  Takes daily Lasix     Family History  Problem Relation Age of Onset  . Allergies Father   . Heart disease Father        before 88  . Heart failure Father   . Hypertension Father   . Hyperlipidemia Father   . Heart disease Mother   . Diabetes Mother   . Hypertension Mother   . Hyperlipidemia Mother   . Hypertension Sister   . Heart disease Sister        before 32  . Hyperlipidemia Sister   . Diabetes Brother   . Hypertension Brother   . Diabetes Sister   . Hypertension Sister   . Diabetes Son   . Hypertension Son   . Cancer Other      Current Outpatient Medications:  .  dexlansoprazole (DEXILANT) 60 MG capsule, Take 1 capsule (60 mg total) by mouth daily., Disp: 90 capsule, Rfl: 1 .  ferrous sulfate 324 MG TBEC, Take 324 mg by mouth 2 (two) times daily., Disp: , Rfl:  .  acetaminophen (TYLENOL) 500 MG tablet, Take 500-1,000 mg by mouth every 4 (four) hours as needed for moderate pain. , Disp: , Rfl:  .  albuterol (VENTOLIN HFA) 108 (90 Base) MCG/ACT inhaler, Inhale 1-2 puffs into the lungs every 6 (six) hours as needed for wheezing or shortness of breath., Disp: , Rfl:  .  Alcohol Swabs (B-D SINGLE USE SWABS REGULAR) PADS, USE TWICE DAILY AS DIRECTED, Disp: 200 each, Rfl: 6 .  Ascorbic Acid (VITAMIN C WITH ROSE HIPS) 1000 MG tablet, Take 1,000 mg by mouth daily., Disp: , Rfl:  .  atorvastatin (LIPITOR) 10 MG tablet, TAKE 1 TABLET EVERY EVENING., Disp: 90 tablet, Rfl: 2 .  B-D ULTRAFINE III SHORT PEN 31G X 8 MM MISC, USE DAILY WITH VICTOZA AND/OR NOVOLOG., Disp: 400 each, Rfl: 1 .  Blood Glucose Calibration (TRUE METRIX LEVEL 1) Low SOLN, Use as directed  Dx:e11.22, Disp: 1 each, Rfl: 2 .  Blood Glucose Monitoring Suppl (TRUE METRIX METER) w/Device KIT, Use as directed to check blood sugars 2 times per day dx: e11.22, Disp: 1 kit, Rfl: 1 .  carvedilol (COREG)  25 MG tablet, TAKE 1 TABLET TWICE DAILY WITH A MEAL, Disp: 180 tablet, Rfl: 2 .  Cholecalciferol (VITAMIN D-3) 1000 units CAPS, Take 5,000 Units by mouth daily. , Disp: , Rfl:  .  diclofenac sodium (VOLTAREN) 1 % GEL, Apply 2 g topically 4 (four) times daily as needed (pain)., Disp: , Rfl:  .  furosemide (LASIX) 80 MG tablet, Take 1 tablet (80 mg total) by mouth daily. 66m daily Monday-Friday, 43mon Saturday and Sunday, Disp: 90 tablet, Rfl: 0 .  glucose blood (TRUE METRIX BLOOD GLUCOSE TEST) test strip, Use as directed to check blood sugars 2 times per day dx: e11.22, Disp: 300 each, Rfl: 2 .  hydrALAZINE (APRESOLINE) 25 MG tablet, Take 4 tablets (100 mg total) by mouth 3 (three) times daily., Disp: 360 tablet, Rfl: 3 .  hydrocortisone cream 1 %, Apply 1 application topically daily as needed for itching. , Disp: , Rfl:  .  insulin glargine, 2 Unit Dial, (TOUJEO MAX SOLOSTAR) 300 UNIT/ML Solostar Pen, Inject 25 Units into the skin at bedtime., Disp: , Rfl:  .  insulin lispro (HUMALOG KWIKPEN) 100 UNIT/ML KwikPen, Inject 0.02-0.04 mLs (2-4 Units total) into the skin 3 (three) times daily as needed (high blood sugar over 150)., Disp: 15 mL, Rfl: 2 .  Isopropyl Alcohol (ALCOHOL WIPES) 70 % MISC, Apply 1 each topically 2 (two) times daily., Disp: 300 each, Rfl: 3 .  isosorbide mononitrate (IMDUR) 30 MG 24 hr tablet, Take 1 tablet (30 mg total) by mouth daily., Disp: 30 tablet, Rfl: 11 .  lamoTRIgine (LAMICTAL) 150 MG tablet, Take 1 tablet (150 mg total) by mouth 2 (two) times daily., Disp: 180 tablet, Rfl: 3 .  levETIRAcetam (KEPPRA) 500 MG tablet, TAKE 2 TABLETS AT BEDTIME, Disp: 180 tablet, Rfl: 0 .  Menthol, Topical Analgesic, (STOPAIN ROLL-ON EX), Apply 1 application topically daily as needed (pain)., Disp: , Rfl:  .  neomycin-bacitracin-polymyxin (NEOSPORIN) ointment, Apply 1 application topically as needed for wound care. , Disp: , Rfl:  .  ondansetron (ZOFRAN) 4 MG tablet, , Disp: , Rfl:  .   OZEMPIC, 1 MG/DOSE, 4 MG/3ML SOPN, INJECT 1MG INTO THE SKIN ONCE A WEEK, Disp: 9 mL, Rfl: 2 .  Pancrelipase, Lip-Prot-Amyl, (ZENPEP PO), Take by mouth., Disp: , Rfl:  .  Pancrelipase, Lip-Prot-Amyl, 25000-79000 units CPEP, Take 1-2 capsules by mouth See admin instructions. Take 2 capsules in the morning, 1 capsule with lunch, and 2 capsules in the evening., Disp: , Rfl:  .  potassium chloride SA (KLOR-CON) 20 MEQ tablet, TAKE 2 TABLETS EVERY DAY BUT WHEN TAKING METOLAZONE TAKE 3 TABLETS DAILY AS DIRECTED, Disp: 204 tablet, Rfl: 2 .  sennosides-docusate sodium (SENOKOT-S) 8.6-50 MG tablet, Take 1 tablet by mouth daily., Disp: , Rfl:  .  sertraline (ZOLOFT) 50 MG tablet, TAKE 1 TABLET EVERY DAY, Disp: 90 tablet, Rfl: 2 .  tolnaftate (TINACTIN) 1 % cream, Apply 1 application topically daily as needed (foot fungus). , Disp: , Rfl:  .  TRUEplus Lancets 33G MISC, Use as directed to check blood sugars 2 times per day dx: e11.22, Disp: 300 each, Rfl: 2   Allergies  Allergen Reactions  . Other Anaphylaxis and Rash    Bleach  . Penicillins Hives    Did it involve swelling of the face/tongue/throat, SOB, or low BP? No Did it involve sudden or severe rash/hives, skin peeling, or any reaction on the inside of your mouth or nose? Yes Did you need to seek medical attention at a hospital or doctor's office? Yes When did it last happen?Childhood allergy  If all above answers are "NO", may proceed with cephalosporin use.    . Sulfa Antibiotics Hives  . Aspirin Other (See Comments)     On aspirin 81 mg - Rectal bleeding in dec 2018  . Codeine     Headache and makes the patient feel "off"     Review of Systems  Constitutional: Negative.   Eyes: Negative for blurred vision.  Respiratory: Negative.  Negative for shortness of breath.   Cardiovascular: Negative.  Negative for chest pain and palpitations.  Gastrointestinal: Negative.   Neurological: Negative.      Today's Vitals  09/26/20 1108   BP: 118/60  Pulse: 68  Temp: 98.3 F (36.8 C)  TempSrc: Oral  Weight: 198 lb (89.8 kg)  Height: 4' 11.4" (1.509 m)   Body mass index is 39.45 kg/m.  Wt Readings from Last 3 Encounters:  09/26/20 198 lb (89.8 kg)  08/11/20 195 lb (88.5 kg)  06/20/20 205 lb 3.2 oz (93.1 kg)      Objective:  Physical Exam Vitals and nursing note reviewed.  Constitutional:      Appearance: Normal appearance. She is obese.  HENT:     Head: Normocephalic and atraumatic.     Nose:     Comments: Masked     Mouth/Throat:     Comments: Masked  Eyes:     Extraocular Movements: Extraocular movements intact.  Cardiovascular:     Rate and Rhythm: Normal rate and regular rhythm.     Heart sounds: Normal heart sounds.  Pulmonary:     Effort: Pulmonary effort is normal.     Breath sounds: Normal breath sounds.  Musculoskeletal:     Right lower leg: Edema present.     Left lower leg: Edema present.  Skin:    General: Skin is warm.  Neurological:     General: No focal deficit present.     Mental Status: She is alert.  Psychiatric:        Mood and Affect: Mood normal.        Behavior: Behavior normal.         Assessment And Plan:     1. Type 2 diabetes mellitus with stage 2 chronic kidney disease, with long-term current use of insulin (HCC) Comments: Chronic, I will check labs as listed below. She was congratulated on her improved blood sugars.  - CMP14+EGFR - Hemoglobin A1c  2. Malignant hypertension, heart failure & chronic kidney dis stage II ([place]) Comments: Chronic, well controlled today. She is encouraged to follow low sodium diet.   3. Chronic diastolic CHF (congestive heart failure), NYHA class 2 ([place]) Comments: She was started on Jardiance per Cardiology. Unfortunately, she suffered yeast infection and has since stopped the medication. Will consider [place].   4. Seizure disorder ([place]) Comments: Chronic, appears stable. She denies having any seizure activity since her last  visit.   5. Class 2 severe obesity due to excess calories with serious comorbidity and body mass index (BMI) of 39.0 to 39.9 in adult [facility]) Comments: She ha gained 3 pounds since Feb. Encouraged to strive to lose another 15 pounds within next 4-6 months. She is encouraged to increase daily activity.   6. Personal history of colonic polyps Comments: Last colonoscopy Dec 2018. Per patient, Dr. Paulita Fujita states "not time yet". GI note documentation, she is due 3-5 years.  7. Drug therapy - Levetiracetam level - Lamotrigine level   Patient was given opportunity to ask questions. Patient verbalized understanding of the plan and was able to repeat key elements of the plan. All questions were answered to their satisfaction.   I, [name], MD, have reviewed all documentation for this visit. The documentation on 09/26/20 for the exam, diagnosis, procedures, and orders are all accurate and complete.   IF YOU HAVE BEEN REFERRED TO A SPECIALIST, IT MAY TAKE 1-2 WEEKS TO SCHEDULE/PROCESS THE REFERRAL. IF YOU HAVE NOT HEARD FROM US/SPECIALIST IN TWO WEEKS, PLEASE GIVE [place] A CALL AT [phone].   THE PATIENT IS ENCOURAGED TO PRACTICE SOCIAL DISTANCING DUE TO THE COVID-19 PANDEMIC.

## 2020-09-26 NOTE — Chronic Care Management (AMB) (Signed)
Chronic Care Management    Social Work Note  09/26/2020 Name: [name] MRN: [id number] DOB: [birth year]  [name] is a 75 y.o. year old female who is a primary care patient of [name], MD. The CCM team was consulted to assist the patient with chronic disease management and/or care coordination needs related to: Transportation Needs .   Collaboration with Dr. Baird Cancer for discussion surrounding resource needs in response to provider referral for social work chronic care management and care coordination services.   Consent to Services:  The patient was given information about Chronic Care Management services, agreed to services, and gave verbal consent prior to initiation of services.  Please see initial visit note for detailed documentation.   Patient agreed to services and consent obtained.   Assessment: Review of patient past medical history, allergies, medications, and health status, including review of relevant consultants reports was performed today as part of a comprehensive evaluation and provision of chronic care management and care coordination services.     SDOH (Social Determinants of Health) assessments and interventions performed:    Advanced Directives Status: Not addressed in this encounter.  CCM Care Plan  Allergies  Allergen Reactions  . Other Anaphylaxis and Rash    Bleach  . Penicillins Hives    Did it involve swelling of the face/tongue/throat, SOB, or low BP? No Did it involve sudden or severe rash/hives, skin peeling, or any reaction on the inside of your mouth or nose? Yes Did you need to seek medical attention at a hospital or doctor's office? Yes When did it last happen?Childhood allergy  If all above answers are "NO", may proceed with cephalosporin use.    . Sulfa Antibiotics Hives  . Aspirin Other (See Comments)     On aspirin 81 mg - Rectal bleeding in dec 2018  . Codeine     Headache and makes the patient feel "off"     Outpatient Encounter Medications as of 09/26/2020  Medication Sig  . acetaminophen (TYLENOL) 500 MG tablet Take 500-1,000 mg by mouth every 4 (four) hours as needed for moderate pain.   Marland Kitchen albuterol (VENTOLIN HFA) 108 (90 Base) MCG/ACT inhaler Inhale 1-2 puffs into the lungs every 6 (six) hours as needed for wheezing or shortness of breath.  . Alcohol Swabs (B-D SINGLE USE SWABS REGULAR) PADS USE TWICE DAILY AS DIRECTED  . Ascorbic Acid (VITAMIN C WITH ROSE HIPS) 1000 MG tablet Take 1,000 mg by mouth daily.  Marland Kitchen atorvastatin (LIPITOR) 10 MG tablet TAKE 1 TABLET EVERY EVENING.  . B-D ULTRAFINE III SHORT PEN 31G X 8 MM MISC USE DAILY WITH VICTOZA AND/OR NOVOLOG.  . Blood Glucose Calibration (TRUE METRIX LEVEL 1) Low SOLN Use as directed  Dx:e11.22  . Blood Glucose Monitoring Suppl (TRUE METRIX METER) w/Device KIT Use as directed to check blood sugars 2 times per day dx: e11.22  . carvedilol (COREG) 25 MG tablet TAKE 1 TABLET TWICE DAILY WITH A MEAL  . Cholecalciferol (VITAMIN D-3) 1000 units CAPS Take 5,000 Units by mouth daily.   Marland Kitchen dexlansoprazole (DEXILANT) 60 MG capsule Take 1 capsule (60 mg total) by mouth daily.  . diclofenac sodium (VOLTAREN) 1 % GEL Apply 2 g topically 4 (four) times daily as needed (pain).  . ferrous sulfate 324 MG TBEC Take 324 mg by mouth 2 (two) times daily.  . furosemide (LASIX) 80 MG tablet Take 1 tablet (80 mg total) by mouth daily. 80mg  daily Monday-Friday, 40mg  on Saturday and Sunday  .  glucose blood (TRUE METRIX BLOOD GLUCOSE TEST) test strip Use as directed to check blood sugars 2 times per day dx: e11.22  . hydrALAZINE (APRESOLINE) 25 MG tablet Take 4 tablets (100 mg total) by mouth 3 (three) times daily.  . hydrocortisone cream 1 % Apply 1 application topically daily as needed for itching.   . insulin glargine, 2 Unit Dial, (TOUJEO MAX SOLOSTAR) 300 UNIT/ML Solostar Pen Inject 25 Units into the skin at bedtime.  . insulin lispro (HUMALOG KWIKPEN) 100 UNIT/ML  KwikPen Inject 0.02-0.04 mLs (2-4 Units total) into the skin 3 (three) times daily as needed (high blood sugar over 150).  . Isopropyl Alcohol (ALCOHOL WIPES) 70 % MISC Apply 1 each topically 2 (two) times daily.  . isosorbide mononitrate (IMDUR) 30 MG 24 hr tablet Take 1 tablet (30 mg total) by mouth daily.  Marland Kitchen lamoTRIgine (LAMICTAL) 150 MG tablet Take 1 tablet (150 mg total) by mouth 2 (two) times daily.  Marland Kitchen levETIRAcetam (KEPPRA) 500 MG tablet TAKE 2 TABLETS AT BEDTIME  . Menthol, Topical Analgesic, (STOPAIN ROLL-ON EX) Apply 1 application topically daily as needed (pain).  Marland Kitchen neomycin-bacitracin-polymyxin (NEOSPORIN) ointment Apply 1 application topically as needed for wound care.   . ondansetron (ZOFRAN) 4 MG tablet   . OZEMPIC, 1 MG/DOSE, 4 MG/3ML SOPN INJECT $RemoveBef'1MG'LhUfcfNWrD$  INTO THE SKIN ONCE A WEEK  . Pancrelipase, Lip-Prot-Amyl, (ZENPEP PO) Take by mouth.  . Pancrelipase, Lip-Prot-Amyl, 25000-79000 units CPEP Take 1-2 capsules by mouth See admin instructions. Take 2 capsules in the morning, 1 capsule with lunch, and 2 capsules in the evening.  . potassium chloride SA (KLOR-CON) 20 MEQ tablet TAKE 2 TABLETS EVERY DAY BUT WHEN TAKING METOLAZONE TAKE 3 TABLETS DAILY AS DIRECTED  . sennosides-docusate sodium (SENOKOT-S) 8.6-50 MG tablet Take 1 tablet by mouth daily.  . sertraline (ZOLOFT) 50 MG tablet TAKE 1 TABLET EVERY DAY  . tolnaftate (TINACTIN) 1 % cream Apply 1 application topically daily as needed (foot fungus).   . TRUEplus Lancets 33G MISC Use as directed to check blood sugars 2 times per day dx: e11.22   No facility-administered encounter medications on file as of 09/26/2020.    Patient Active Problem List   Diagnosis Date Noted  . Postinflammatory pulmonary fibrosis ([place]) 06/08/2020  . Lung nodule 06/08/2020  . Chronic kidney disease, stage 2 (mild) 01/05/2020  . Hypertensive chronic kidney disease with stage 1 through stage 4 chronic kidney disease, or unspecified chronic kidney disease  01/05/2020  . Snoring 01/05/2020  . Neuroleptic-induced parkinsonism ([place]) 09/28/2019  . Class 3 severe obesity due to excess calories with serious comorbidity and body mass index (BMI) of 40.0 to 44.9 in adult [facility]) 05/22/2019  . Pulmonary hypertension, unspecified ([place]) 04/06/2019  . Syncope 02/24/2019  . Rheumatoid arthritis ([place]) 01/21/2019  . Radial styloid tenosynovitis 11/24/2018  . Type 2 diabetes mellitus with stage 2 chronic kidney disease, with long-term current use of insulin ([place]) 07/21/2018  . Chest pain 05/15/2018  . 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 Excessive daytime sleepiness 01/30/2018  . Sleep-related hypoventilation 01/30/2018  . Recurrent depression ([place]) 11/06/2017  . OSA on CPAP 10/07/2017  . Todd's paralysis ([place])   . Chronic diastolic CHF (congestive heart failure), NYHA class 2 ([place])   . Chronic respiratory failure with hypoxia ([place])   . Acute lower GI bleeding   . Stroke-like symptoms 06/15/2017  . Rectal bleeding 06/15/2017  . Dehydration 03/07/2017  . Medication management 12/24/2016  .  Near syncope 03/09/2016  . Racing heart beat 03/09/2016  . Dyspnea 01/28/2016  . Spinal stenosis at L4-L5 level 10/04/2014  . Parkinsonism ([place]) 06/24/2013  . Lower abdominal pain 10/31/2012  . Type II diabetes mellitus, uncontrolled ([place]) 10/31/2012  . Seizure disorder ([place]) 10/31/2012  . Diverticulosis 10/31/2012  . Anemia associated with acute blood loss 08/14/2012  . Hematochezia 08/14/2012  . Varicose veins of bilateral lower extremities with other complications 93/71/6967  . Leg pain 03/21/2012  . Radicular pain of left lower extremity 03/21/2012  . Heart murmur 11/21/2011  . Infected pseudocyst of pancreas 10/28/2011  . Chronic pancreatitis ([place]) 10/28/2011  . Hypertensive heart disease with chronic diastolic congestive heart failure ([place]) 10/28/2011  . Dyslipidemia, goal LDL below 100  10/28/2011  . Morbid obesity with BMI of 45.0-49.9, adult ([place]) 10/28/2011  . Pseudoseizure 10/24/2011  . Gastroesophageal reflux 10/23/2011  . History of tobacco abuse 10/23/2011  . Hypoxemia 10/23/2011  . Disturbance in sleep behavior 09/03/2011  . Partial epilepsy with impairment of consciousness ([place]) 09/03/2011  . Tremor 09/03/2011  . Peripheral edema 04/14/2011  . Obesity hypoventilation syndrome ([place]) 10/26/2010  . Atherosclerotic heart disease of native coronary artery with other forms of angina pectoris ([place]) 08/28/2010  . Acute, but ill-defined, cerebrovascular disease 05/04/2008  . Arthropathy 05/04/2008  . Disequilibrium 05/04/2008  . Generalized tonic clonic epilepsy ([place]) 05/04/2008  . Increased frequency of urination 05/04/2008  . Insomnia, unspecified 05/04/2008  . Major depressive disorder, single episode, unspecified 05/04/2008  . Memory loss 05/04/2008  . Nausea 05/04/2008  . Other malaise and fatigue 05/04/2008  . Swelling of limb 05/04/2008    Conditions to be addressed/monitored: CHF and DMII; Transportation  Care Plan : [place]  Updates made by Daneen Schick since 09/26/2020 12:00 AM    Problem: Barriers to Treatment     Goal: Transportation Barriers to Treatment Identified and Managed   Start Date: 09/26/2020  Expected End Date: 11/10/2020  This Visit's Progress: On track  Priority: High  Note:   Current Barriers:  . Chronic disease management support and education needs related to CHF and DM  . Transportation  Social Worker Clinical Goal(s):  Marland Kitchen Over the next 45 days, patient will work with SW to identify transportation resources  CCM SW Interventions:  . Inter-disciplinary care team collaboration (see longitudinal plan of care) . Collaboration with [name], MD regarding development and update of comprehensive plan of care as evidenced by provider attestation and co-signature . Communication received from patients primary  provider, Dr. Baird Cancer who indicates patient is in need of SW assistance to obtain SCAT approval . Completed Part A of SCAT application on behalf of the patient . Collaboration with Dr. Baird Cancer to request Part B of application be completed and returned to SW for submission . Scheduled follow up call over the next 30 days  Patient Goals/Self-Care Activities . Over the next 30 days, patient will:   - Patient will attend all scheduled provider appointments Work with SW to obtain SCAT approval  Follow Up Plan:  SW will follow up with patient over the next 30 days       Follow Up Plan: SW will follow up with patient by phone over the next month      Daneen Schick, BSW, CDP Social Worker, Certified Dementia Practitioner [place] [facility] [place] [facility] [phone]  Total time spent performing care coordination and/or care management activities with the patient by phone or face to face = 35 minutes.

## 2020-09-26 NOTE — Telephone Encounter (Signed)
Called Neurology made pt appt per RS  Pt appt is scheduled for 01/27/2021 at 2:30 with Dr.Aquino Left detailed message for pt

## 2020-09-26 NOTE — Patient Instructions (Signed)
Diabetes Mellitus and Exercise Exercising regularly is important for overall health, especially for people who have diabetes mellitus. Exercising is not only about losing weight. It has many other health benefits, such as increasing muscle strength and bone density and reducing body fat and stress. This leads to improved fitness, flexibility, and endurance, all of which result in better overall health. What are the benefits of exercise if I have diabetes? Exercise has many benefits for people with diabetes. They include:  Helping to lower and control blood sugar (glucose).  Helping the body to respond better to the hormone insulin by improving insulin sensitivity.  Reducing how much insulin the body needs.  Lowering the risk for heart disease by: ? Lowering "bad" cholesterol and triglyceride levels. ? Increasing "good" cholesterol levels. ? Lowering blood pressure. ? Lowering blood glucose levels. What is my activity plan? Your health care provider or certified diabetes educator can help you make a plan for the type and frequency of exercise that works for you. This is called your activity plan. Be sure to:  Get at least 150 minutes of medium-intensity or high-intensity exercise each week. Exercises may include brisk walking, biking, or water aerobics.  Do stretching and strengthening exercises, such as yoga or weight lifting, at least 2 times a week.  Spread out your activity over at least 3 days of the week.  Get some form of physical activity each day. ? Do not go more than 2 days in a row without some kind of physical activity. ? Avoid being inactive for more than 90 minutes at a time. Take frequent breaks to walk or stretch.  Choose exercises or activities that you enjoy. Set realistic goals.  Start slowly and gradually increase your exercise intensity over time.   How do I manage my diabetes during exercise? Monitor your blood glucose  Check your blood glucose before and  after exercising. If your blood glucose is: ? 240 mg/dL (13.3 mmol/L) or higher before you exercise, check your urine for ketones. These are chemicals created by the liver. If you have ketones in your urine, do not exercise until your blood glucose returns to normal. ? 100 mg/dL (5.6 mmol/L) or lower, eat a snack containing 15-20 grams of carbohydrate. Check your blood glucose 15 minutes after the snack to make sure that your glucose level is above 100 mg/dL (5.6 mmol/L) before you start your exercise.  Know the symptoms of low blood glucose (hypoglycemia) and how to treat it. Your risk for hypoglycemia increases during and after exercise. Follow these tips and your health care provider's instructions  Keep a carbohydrate snack that is fast-acting for use before, during, and after exercise to help prevent or treat hypoglycemia.  Avoid injecting insulin into areas of the body that are going to be exercised. For example, avoid injecting insulin into: ? Your arms, when you are about to play tennis. ? Your legs, when you are about to go jogging.  Keep records of your exercise habits. Doing this can help you and your health care provider adjust your diabetes management plan as needed. Write down: ? Food that you eat before and after you exercise. ? Blood glucose levels before and after you exercise. ? The type and amount of exercise you have done.  Work with your health care provider when you start a new exercise or activity. He or she may need to: ? Make sure that the activity is safe for you. ? Adjust your insulin, other medicines, and food that   you eat.  Drink plenty of water while you exercise. This prevents loss of water (dehydration) and problems caused by a lot of heat in the body (heat stroke).   Where to find more information  American Diabetes Association: www.diabetes.org Summary  Exercising regularly is important for overall health, especially for people who have diabetes  mellitus.  Exercising has many health benefits. It increases muscle strength and bone density and reduces body fat and stress. It also lowers and controls blood glucose.  Your health care provider or certified diabetes educator can help you make an activity plan for the type and frequency of exercise that works for you.  Work with your health care provider to make sure any new activity is safe for you. Also work with your health care provider to adjust your insulin, other medicines, and the food you eat. This information is not intended to replace advice given to you by your health care provider. Make sure you discuss any questions you have with your health care provider. Document Revised: 03/09/2019 Document Reviewed: 03/09/2019 Elsevier Patient Education  2021 Elsevier Inc.  

## 2020-09-27 ENCOUNTER — Ambulatory Visit: Payer: Medicare HMO

## 2020-09-27 DIAGNOSIS — I5032 Chronic diastolic (congestive) heart failure: Secondary | ICD-10-CM

## 2020-09-27 DIAGNOSIS — E1122 Type 2 diabetes mellitus with diabetic chronic kidney disease: Secondary | ICD-10-CM

## 2020-09-27 DIAGNOSIS — N182 Chronic kidney disease, stage 2 (mild): Secondary | ICD-10-CM

## 2020-09-27 NOTE — Patient Instructions (Signed)
Social Worker Visit Information  Goals we discussed today:  Goals Addressed            This Visit's Progress   . Effective Long Term Care Planning       Timeframe:  Short-Term Goal Priority:  High Start Date:    3.25.22                         Expected End Date: 3.29.22                      Next planned outreach: 4.21.22   Patient Goals/Self-Care Activities . Over the next 21 days, patient will:   - Patient will self administer medications as prescribed Patient will attend all scheduled provider appointments Patient will call provider office for new concerns or questions Contact SW if transportation is needed to [place] [facility] appointments Work with her sister to contact [employer] to apply for section 8        Materials Provided: Verbal education about Section 8 provided by phone  Follow Up Plan: SW will follow up with patient by phone over the next month   Daneen Schick, BSW, CDP Social Worker, Certified Dementia Practitioner [place] [facility] [phone]

## 2020-09-27 NOTE — Chronic Care Management (AMB) (Signed)
Chronic Care Management    Social Work Note  09/27/2020 Name: ELLISSA [name] MRN: [id number] DOB: [birth year]  [name] is a 75 y.o. year old female who is a primary care patient of [name] Chard, MD. The CCM team was consulted to assist the patient with chronic disease management and/or care coordination needs related to: housing.   Engaged with patient sister by phone for follow up visit in response to provider referral for social work chronic care management and care coordination services.   Consent to Services:  The patient was given information about Chronic Care Management services, agreed to services, and gave verbal consent prior to initiation of services.  Please see initial visit note for detailed documentation.   Patient agreed to services and consent obtained.   Assessment: Review of patient past medical history, allergies, medications, and health status, including review of relevant consultants reports was performed today as part of a comprehensive evaluation and provision of chronic care management and care coordination services.     SDOH (Social Determinants of Health) assessments and interventions performed:    Advanced Directives Status: Not addressed in this encounter.  CCM Care Plan  Allergies  Allergen Reactions  . Other Anaphylaxis and Rash    Bleach  . Penicillins Hives    Did it involve swelling of the face/tongue/throat, SOB, or low BP? No Did it involve sudden or severe rash/hives, skin peeling, or any reaction on the inside of your mouth or nose? Yes Did you need to seek medical attention at a hospital or doctor's office? Yes When did it last happen?Childhood allergy  If all above answers are "NO", may proceed with cephalosporin use.    . Sulfa Antibiotics Hives  . Aspirin Other (See Comments)     On aspirin 81 mg - Rectal bleeding in dec 2018  . Codeine     Headache and makes the patient feel "off"    Outpatient Encounter Medications  as of 09/27/2020  Medication Sig  . acetaminophen (TYLENOL) 500 MG tablet Take 500-1,000 mg by mouth every 4 (four) hours as needed for moderate pain.   Marland Kitchen albuterol (VENTOLIN HFA) 108 (90 Base) MCG/ACT inhaler Inhale 1-2 puffs into the lungs every 6 (six) hours as needed for wheezing or shortness of breath.  . Alcohol Swabs (B-D SINGLE USE SWABS REGULAR) PADS USE TWICE DAILY AS DIRECTED  . Ascorbic Acid (VITAMIN C WITH ROSE HIPS) 1000 MG tablet Take 1,000 mg by mouth daily.  Marland Kitchen atorvastatin (LIPITOR) 10 MG tablet TAKE 1 TABLET EVERY EVENING.  . B-D ULTRAFINE III SHORT PEN 31G X 8 MM MISC USE DAILY WITH VICTOZA AND/OR NOVOLOG.  . Blood Glucose Calibration (TRUE METRIX LEVEL 1) Low SOLN Use as directed  Dx:e11.22  . Blood Glucose Monitoring Suppl (TRUE METRIX METER) w/Device KIT Use as directed to check blood sugars 2 times per day dx: e11.22  . carvedilol (COREG) 25 MG tablet TAKE 1 TABLET TWICE DAILY WITH A MEAL  . Cholecalciferol (VITAMIN D-3) 1000 units CAPS Take 5,000 Units by mouth daily.   Marland Kitchen dexlansoprazole (DEXILANT) 60 MG capsule Take 1 capsule (60 mg total) by mouth daily.  . diclofenac sodium (VOLTAREN) 1 % GEL Apply 2 g topically 4 (four) times daily as needed (pain).  . ferrous sulfate 324 MG TBEC Take 324 mg by mouth 2 (two) times daily.  . furosemide (LASIX) 80 MG tablet Take 1 tablet (80 mg total) by mouth daily. 13m daily Monday-Friday, 457mon Saturday and Sunday  .  glucose blood (TRUE METRIX BLOOD GLUCOSE TEST) test strip Use as directed to check blood sugars 2 times per day dx: e11.22  . hydrALAZINE (APRESOLINE) 25 MG tablet Take 4 tablets (100 mg total) by mouth 3 (three) times daily.  . hydrocortisone cream 1 % Apply 1 application topically daily as needed for itching.   . insulin glargine, 2 Unit Dial, (TOUJEO MAX SOLOSTAR) 300 UNIT/ML Solostar Pen Inject 25 Units into the skin at bedtime.  . insulin lispro (HUMALOG KWIKPEN) 100 UNIT/ML KwikPen Inject 0.02-0.04 mLs (2-4 Units  total) into the skin 3 (three) times daily as needed (high blood sugar over 150).  . Isopropyl Alcohol (ALCOHOL WIPES) 70 % MISC Apply 1 each topically 2 (two) times daily.  . isosorbide mononitrate (IMDUR) 30 MG 24 hr tablet Take 1 tablet (30 mg total) by mouth daily.  Marland Kitchen lamoTRIgine (LAMICTAL) 150 MG tablet Take 1 tablet (150 mg total) by mouth 2 (two) times daily.  Marland Kitchen levETIRAcetam (KEPPRA) 500 MG tablet TAKE 2 TABLETS AT BEDTIME  . Menthol, Topical Analgesic, (STOPAIN ROLL-ON EX) Apply 1 application topically daily as needed (pain).  Marland Kitchen neomycin-bacitracin-polymyxin (NEOSPORIN) ointment Apply 1 application topically as needed for wound care.   . ondansetron (ZOFRAN) 4 MG tablet   . OZEMPIC, 1 MG/DOSE, 4 MG/3ML SOPN INJECT 1MG INTO THE SKIN ONCE A WEEK  . Pancrelipase, Lip-Prot-Amyl, (ZENPEP PO) Take by mouth.  . Pancrelipase, Lip-Prot-Amyl, 25000-79000 units CPEP Take 1-2 capsules by mouth See admin instructions. Take 2 capsules in the morning, 1 capsule with lunch, and 2 capsules in the evening.  . potassium chloride SA (KLOR-CON) 20 MEQ tablet TAKE 2 TABLETS EVERY DAY BUT WHEN TAKING METOLAZONE TAKE 3 TABLETS DAILY AS DIRECTED  . sennosides-docusate sodium (SENOKOT-S) 8.6-50 MG tablet Take 1 tablet by mouth daily.  . sertraline (ZOLOFT) 50 MG tablet TAKE 1 TABLET EVERY DAY  . tolnaftate (TINACTIN) 1 % cream Apply 1 application topically daily as needed (foot fungus).   . TRUEplus Lancets 33G MISC Use as directed to check blood sugars 2 times per day dx: e11.22   No facility-administered encounter medications on file as of 09/27/2020.    Patient Active Problem List   Diagnosis Date Noted  . Postinflammatory pulmonary fibrosis ([place]) 06/08/2020  . Lung nodule 06/08/2020  . Chronic kidney disease, stage 2 (mild) 01/05/2020  . Hypertensive chronic kidney disease with stage 1 through stage 4 chronic kidney disease, or unspecified chronic kidney disease 01/05/2020  . Snoring 01/05/2020  .  Neuroleptic-induced parkinsonism ([place]) 09/28/2019  . Class 3 severe obesity due to excess calories with serious comorbidity and body mass index (BMI) of 40.0 to 44.9 in adult [facility]) 05/22/2019  . Pulmonary hypertension, unspecified ([place]) 04/06/2019  . Syncope 02/24/2019  . Rheumatoid arthritis ([place]) 01/21/2019  . Radial styloid tenosynovitis 11/24/2018  . Type 2 diabetes mellitus with stage 2 chronic kidney disease, with long-term current use of insulin ([place]) 07/21/2018  . Chest pain 05/15/2018  . 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 Excessive daytime sleepiness 01/30/2018  . Sleep-related hypoventilation 01/30/2018  . Recurrent depression ([place]) 11/06/2017  . OSA on CPAP 10/07/2017  . Todd's paralysis ([place])   . Chronic diastolic CHF (congestive heart failure), NYHA class 2 ([place])   . Chronic respiratory failure with hypoxia ([place])   . Acute lower GI bleeding   . Stroke-like symptoms 06/15/2017  . Rectal bleeding 06/15/2017  . Dehydration 03/07/2017  . Medication management 12/24/2016  .  Near syncope 03/09/2016  . Racing heart beat 03/09/2016  . Dyspnea 01/28/2016  . Spinal stenosis at L4-L5 level 10/04/2014  . Parkinsonism ([place]) 06/24/2013  . Lower abdominal pain 10/31/2012  . Type II diabetes mellitus, uncontrolled ([place]) 10/31/2012  . Seizure disorder ([place]) 10/31/2012  . Diverticulosis 10/31/2012  . Anemia associated with acute blood loss 08/14/2012  . Hematochezia 08/14/2012  . Varicose veins of bilateral lower extremities with other complications 13/24/4010  . Leg pain 03/21/2012  . Radicular pain of left lower extremity 03/21/2012  . Heart murmur 11/21/2011  . Infected pseudocyst of pancreas 10/28/2011  . Chronic pancreatitis ([place]) 10/28/2011  . Hypertensive heart disease with chronic diastolic congestive heart failure ([place]) 10/28/2011  . Dyslipidemia, goal LDL below 100 10/28/2011  . Morbid obesity with BMI of  45.0-49.9, adult ([place]) 10/28/2011  . Pseudoseizure 10/24/2011  . Gastroesophageal reflux 10/23/2011  . History of tobacco abuse 10/23/2011  . Hypoxemia 10/23/2011  . Disturbance in sleep behavior 09/03/2011  . Partial epilepsy with impairment of consciousness ([place]) 09/03/2011  . Tremor 09/03/2011  . Peripheral edema 04/14/2011  . Obesity hypoventilation syndrome ([place]) 10/26/2010  . Atherosclerotic heart disease of native coronary artery with other forms of angina pectoris ([place]) 08/28/2010  . Acute, but ill-defined, cerebrovascular disease 05/04/2008  . Arthropathy 05/04/2008  . Disequilibrium 05/04/2008  . Generalized tonic clonic epilepsy ([place]) 05/04/2008  . Increased frequency of urination 05/04/2008  . Insomnia, unspecified 05/04/2008  . Major depressive disorder, single episode, unspecified 05/04/2008  . Memory loss 05/04/2008  . Nausea 05/04/2008  . Other malaise and fatigue 05/04/2008  . Swelling of limb 05/04/2008    Conditions to be addressed/monitored: CHF and DMII; Family and relationship dysfunction  Care Plan : [facility] Social Work Care Plan  Updates made by Daneen Schick since 09/27/2020 12:00 AM    Problem: Long-Term Care Planning     Goal: Effective Long-Term Care Planning   Start Date: 09/16/2020  Expected End Date: 10/31/2020  Recent Progress: Not on track  Priority: High  Note:   Current Barriers:  . Chronic disease management support and education needs related to CHF and DM  . Limited social support . Limited knowledge of how to obtain placement . Unable to stay in current living situation due to family relationship dysfunction  Social Worker Clinical Goal(s):  Marland Kitchen Over the next 45 days the patient will work with SW to identify ALF placement opportunities  CCM SW Interventions:  . Inter-disciplinary care team collaboration (see longitudinal plan of care) . Collaboration with [name] Chard, MD regarding development and update of comprehensive plan of care as  evidenced by provider attestation and co-signature . Inbound call received from patients sister [name] who reports last week she planned to let her sister remain living in the home but this week has decided "it's not going to work" . Determined Mrs. Pressley would like to assist the patient with a section 8 application . Provided Mrs. Pressley with the contact number to [employer] . Advised there will be a wait list for all low income housing options as well as a wait list for section 8 - Mrs. Pressley stated understanding . Discussed patient opportunity to move into assisted living - Mrs. Pressley agrees with the patient that this will not work . Collaboration with Dr. Baird [facility] and [place] to communicate intervention and plan Patient Goals/Self-Care Activities . Over the next 21 days, patient will:   - Patient will self administer medications as prescribed  Patient will attend all scheduled provider appointments Patient will call provider office for new concerns or questions Contact SW if transportation is needed to [place] [facility] appointments Work with her sister to contact [employer] to apply for section 8  Follow Up Plan:  SW will follow up with the patient over the next month       Follow Up Plan: SW will follow up with patient by phone over the next month      Daneen Schick, BSW, CDP Social Worker, Certified Dementia Practitioner [place] [facility] [phone]  Total time spent performing care coordination and/or care management activities with the patient by phone or face to face = 18 minutes.

## 2020-09-29 ENCOUNTER — Telehealth: Payer: Self-pay

## 2020-09-29 ENCOUNTER — Ambulatory Visit: Payer: Medicare HMO

## 2020-09-29 DIAGNOSIS — E1122 Type 2 diabetes mellitus with diabetic chronic kidney disease: Secondary | ICD-10-CM

## 2020-09-29 DIAGNOSIS — N182 Chronic kidney disease, stage 2 (mild): Secondary | ICD-10-CM

## 2020-09-29 DIAGNOSIS — I5032 Chronic diastolic (congestive) heart failure: Secondary | ICD-10-CM

## 2020-09-29 NOTE — Chronic Care Management (AMB) (Signed)
Chronic Care Management    Social Work Note  09/29/2020 Name: [name] MRN: [id number] DOB: 1946/02/25  [name] is a 75 y.o. year old female who is a primary care patient of [name], MD. The CCM team was consulted to assist the patient with chronic disease management and/or care coordination needs related to: Transportation Needs .   Collaboration with SCAT for submission of patient application in response to provider referral for social work chronic care management and care coordination services.   Consent to Services:  The patient was given information about Chronic Care Management services, agreed to services, and gave verbal consent prior to initiation of services.  Please see initial visit note for detailed documentation.   Patient agreed to services and consent obtained.   Assessment: Review of patient past medical history, allergies, medications, and health status, including review of relevant consultants reports was performed today as part of a comprehensive evaluation and provision of chronic care management and care coordination services.     SDOH (Social Determinants of Health) assessments and interventions performed:    Advanced Directives Status: Not addressed in this encounter.  CCM Care Plan  Allergies  Allergen Reactions  . Other Anaphylaxis and Rash    Bleach  . Penicillins Hives    Did it involve swelling of the face/tongue/throat, SOB, or low BP? No Did it involve sudden or severe rash/hives, skin peeling, or any reaction on the inside of your mouth or nose? Yes Did you need to seek medical attention at a hospital or doctor's office? Yes When did it last happen?Childhood allergy  If all above answers are "NO", may proceed with cephalosporin use.    . Sulfa Antibiotics Hives  . Aspirin Other (See Comments)     On aspirin 81 mg - Rectal bleeding in dec 2018  . Codeine     Headache and makes the patient feel "off"    Outpatient  Encounter Medications as of 09/29/2020  Medication Sig  . acetaminophen (TYLENOL) 500 MG tablet Take 500-1,000 mg by mouth every 4 (four) hours as needed for moderate pain.   Marland Kitchen albuterol (VENTOLIN HFA) 108 (90 Base) MCG/ACT inhaler Inhale 1-2 puffs into the lungs every 6 (six) hours as needed for wheezing or shortness of breath.  . Alcohol Swabs (B-D SINGLE USE SWABS REGULAR) PADS USE TWICE DAILY AS DIRECTED  . Ascorbic Acid (VITAMIN C WITH ROSE HIPS) 1000 MG tablet Take 1,000 mg by mouth daily.  Marland Kitchen atorvastatin (LIPITOR) 10 MG tablet TAKE 1 TABLET EVERY EVENING.  . B-D ULTRAFINE III SHORT PEN 31G X 8 MM MISC USE DAILY WITH VICTOZA AND/OR NOVOLOG.  . Blood Glucose Calibration (TRUE METRIX LEVEL 1) Low SOLN Use as directed  Dx:e11.22  . Blood Glucose Monitoring Suppl (TRUE METRIX METER) w/Device KIT Use as directed to check blood sugars 2 times per day dx: e11.22  . carvedilol (COREG) 25 MG tablet TAKE 1 TABLET TWICE DAILY WITH A MEAL  . Cholecalciferol (VITAMIN D-3) 1000 units CAPS Take 5,000 Units by mouth daily.   Marland Kitchen dexlansoprazole (DEXILANT) 60 MG capsule Take 1 capsule (60 mg total) by mouth daily.  . diclofenac sodium (VOLTAREN) 1 % GEL Apply 2 g topically 4 (four) times daily as needed (pain).  . ferrous sulfate 324 MG TBEC Take 324 mg by mouth 2 (two) times daily.  . furosemide (LASIX) 80 MG tablet Take 1 tablet (80 mg total) by mouth daily. 80mg  daily Monday-Friday, 40mg  on Saturday and Sunday  .  glucose blood (TRUE METRIX BLOOD GLUCOSE TEST) test strip Use as directed to check blood sugars 2 times per day dx: e11.22  . hydrALAZINE (APRESOLINE) 25 MG tablet Take 4 tablets (100 mg total) by mouth 3 (three) times daily.  . hydrocortisone cream 1 % Apply 1 application topically daily as needed for itching.   . insulin glargine, 2 Unit Dial, (TOUJEO MAX SOLOSTAR) 300 UNIT/ML Solostar Pen Inject 25 Units into the skin at bedtime.  . insulin lispro (HUMALOG KWIKPEN) 100 UNIT/ML KwikPen Inject  0.02-0.04 mLs (2-4 Units total) into the skin 3 (three) times daily as needed (high blood sugar over 150).  . Isopropyl Alcohol (ALCOHOL WIPES) 70 % MISC Apply 1 each topically 2 (two) times daily.  . isosorbide mononitrate (IMDUR) 30 MG 24 hr tablet Take 1 tablet (30 mg total) by mouth daily.  Marland Kitchen lamoTRIgine (LAMICTAL) 150 MG tablet Take 1 tablet (150 mg total) by mouth 2 (two) times daily.  Marland Kitchen levETIRAcetam (KEPPRA) 500 MG tablet TAKE 2 TABLETS AT BEDTIME  . Menthol, Topical Analgesic, (STOPAIN ROLL-ON EX) Apply 1 application topically daily as needed (pain).  Marland Kitchen neomycin-bacitracin-polymyxin (NEOSPORIN) ointment Apply 1 application topically as needed for wound care.   . ondansetron (ZOFRAN) 4 MG tablet   . OZEMPIC, 1 MG/DOSE, 4 MG/3ML SOPN INJECT $RemoveBef'1MG'[id number]$  INTO THE SKIN ONCE A WEEK  . Pancrelipase, Lip-Prot-Amyl, (ZENPEP PO) Take by mouth.  . Pancrelipase, Lip-Prot-Amyl, 25000-79000 units CPEP Take 1-2 capsules by mouth See admin instructions. Take 2 capsules in the morning, 1 capsule with lunch, and 2 capsules in the evening.  . potassium chloride SA (KLOR-CON) 20 MEQ tablet TAKE 2 TABLETS EVERY DAY BUT WHEN TAKING METOLAZONE TAKE 3 TABLETS DAILY AS DIRECTED  . sennosides-docusate sodium (SENOKOT-S) 8.6-50 MG tablet Take 1 tablet by mouth daily.  . sertraline (ZOLOFT) 50 MG tablet TAKE 1 TABLET EVERY DAY  . tolnaftate (TINACTIN) 1 % cream Apply 1 application topically daily as needed (foot fungus).   . TRUEplus Lancets 33G MISC Use as directed to check blood sugars 2 times per day dx: e11.22   No facility-administered encounter medications on file as of 09/29/2020.    Patient Active Problem List   Diagnosis Date Noted  . Postinflammatory pulmonary fibrosis ([place]) 06/08/2020  . Lung nodule 06/08/2020  . Chronic kidney disease, stage 2 (mild) 01/05/2020  . Hypertensive chronic kidney disease with stage 1 through stage 4 chronic kidney disease, or unspecified chronic kidney disease 01/05/2020  .  Snoring 01/05/2020  . Neuroleptic-induced parkinsonism ([place]) 09/28/2019  . Class 3 severe obesity due to excess calories with serious comorbidity and body mass index (BMI) of 40.0 to 44.9 in adult [facility]) 05/22/2019  . Pulmonary hypertension, unspecified ([place]) 04/06/2019  . Syncope 02/24/2019  . Rheumatoid arthritis ([place]) 01/21/2019  . Radial styloid tenosynovitis 11/24/2018  . Type 2 diabetes mellitus with stage 2 chronic kidney disease, with long-term current use of insulin ([place]) 07/21/2018  . Chest pain 05/15/2018  . 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 Excessive daytime sleepiness 01/30/2018  . Sleep-related hypoventilation 01/30/2018  . Recurrent depression ([place]) 11/06/2017  . OSA on CPAP 10/07/2017  . Todd's paralysis ([place])   . Chronic diastolic CHF (congestive heart failure), NYHA class 2 ([place])   . Chronic respiratory failure with hypoxia ([place])   . Acute lower GI bleeding   . Stroke-like symptoms 06/15/2017  . Rectal bleeding 06/15/2017  . Dehydration 03/07/2017  . Medication management 12/24/2016  .  Near syncope 03/09/2016  . Racing heart beat 03/09/2016  . Dyspnea 01/28/2016  . Spinal stenosis at L4-L5 level 10/04/2014  . Parkinsonism ([place]) 06/24/2013  . Lower abdominal pain 10/31/2012  . Type II diabetes mellitus, uncontrolled ([place]) 10/31/2012  . Seizure disorder ([place]) 10/31/2012  . Diverticulosis 10/31/2012  . Anemia associated with acute blood loss 08/14/2012  . Hematochezia 08/14/2012  . Varicose veins of bilateral lower extremities with other complications 86/76/1950  . Leg pain 03/21/2012  . Radicular pain of left lower extremity 03/21/2012  . Heart murmur 11/21/2011  . Infected pseudocyst of pancreas 10/28/2011  . Chronic pancreatitis ([place]) 10/28/2011  . Hypertensive heart disease with chronic diastolic congestive heart failure ([place]) 10/28/2011  . Dyslipidemia, goal LDL below 100 10/28/2011  .  Morbid obesity with BMI of 45.0-49.9, adult ([place]) 10/28/2011  . Pseudoseizure 10/24/2011  . Gastroesophageal reflux 10/23/2011  . History of tobacco abuse 10/23/2011  . Hypoxemia 10/23/2011  . Disturbance in sleep behavior 09/03/2011  . Partial epilepsy with impairment of consciousness ([place]) 09/03/2011  . Tremor 09/03/2011  . Peripheral edema 04/14/2011  . Obesity hypoventilation syndrome ([place]) 10/26/2010  . Atherosclerotic heart disease of native coronary artery with other forms of angina pectoris ([place]) 08/28/2010  . Acute, but ill-defined, cerebrovascular disease 05/04/2008  . Arthropathy 05/04/2008  . Disequilibrium 05/04/2008  . Generalized tonic clonic epilepsy ([place]) 05/04/2008  . Increased frequency of urination 05/04/2008  . Insomnia, unspecified 05/04/2008  . Major depressive disorder, single episode, unspecified 05/04/2008  . Memory loss 05/04/2008  . Nausea 05/04/2008  . Other malaise and fatigue 05/04/2008  . Swelling of limb 05/04/2008    Conditions to be addressed/monitored: CHF and DMII; Transportation  Care Plan : [place]  Updates made by Daneen Schick since 09/29/2020 12:00 AM    Problem: Barriers to Treatment     Goal: Transportation Barriers to Treatment Identified and Managed   Start Date: 09/26/2020  Expected End Date: 11/10/2020  This Visit's Progress: On track  Recent Progress: On track  Priority: High  Note:   Current Barriers:  . Chronic disease management support and education needs related to CHF and DM  . Transportation  Social Worker Clinical Goal(s):  Marland Kitchen Over the next 45 days, patient will work with SW to identify transportation resources  CCM SW Interventions:  . Inter-disciplinary care team collaboration (see longitudinal plan of care) . Collaboration with [name], MD regarding development and update of comprehensive plan of care as evidenced by provider attestation and co-signature . Collaboration with patients  primary provider office who has completed Part B of SCAT application . Collaboration with [facility] [place] (SCAT) to submit Part A and B on patients behalf  Patient Goals/Self-Care Activities . Over the next 30 days, patient will:   - Patient will attend all scheduled provider appointments Work with SW to obtain SCAT approval  Follow Up Plan:  SW will follow up with patient over the next 30 days       Follow Up Plan: SW will follow up with patient by phone over the next month      Daneen Schick, BSW, CDP Social Worker, Certified Dementia Practitioner [place] [facility] [place] [facility] [phone]  Total time spent performing care coordination and/or care management activities with the patient by phone or face to face = 10 minutes.

## 2020-09-29 NOTE — Patient Instructions (Signed)
Social Worker Visit Information  Goals we discussed today:  Goals Addressed            This Visit's Progress   . Transportation Barriers to Treatment Identified and Managed   On track    Timeframe:  Short-Term Goal Priority:  High Start Date:  4.4.22                           Expected End Date: 5.19.22                       Next planned outreach 4.21.22  Patient Goals/Self-Care Activities . Over the next 30 days, patient will:   - Patient will attend all scheduled provider appointments -Work with SW to obtain SCAT approval        Follow Up Plan: SW will follow up with patient by phone over the next month   Daneen Schick, BSW, CDP Social Worker, Certified Dementia Practitioner [place] [facility] [place] [facility] [phone]

## 2020-09-29 NOTE — Progress Notes (Signed)
09/29/20-Called and spoke with the patient to schedule a CCM Call visit with [name], CPP. Patient agreed and confirmed CCM Call Appointment for 10/13/20 at 1 PM.  Staff message sent to [facility], NT on 09/29/20 at 12:48 PM.  [name], CPP Notified.  Raynelle Highland, [place] Pharmacist Assistant [phone] CCM Total Time: 12 minutes

## 2020-09-30 ENCOUNTER — Encounter (HOSPITAL_COMMUNITY): Payer: Self-pay | Admitting: Cardiology

## 2020-09-30 ENCOUNTER — Encounter (HOSPITAL_COMMUNITY): Payer: Medicare HMO | Admitting: Cardiology

## 2020-10-01 LAB — CMP14+EGFR
ALT: 17 IU/L (ref 0–32)
AST: 16 IU/L (ref 0–40)
Albumin/Globulin Ratio: 1.1 — ABNORMAL LOW (ref 1.2–2.2)
Albumin: 4.4 g/dL (ref 3.7–4.7)
Alkaline Phosphatase: 144 IU/L — ABNORMAL HIGH (ref 44–121)
BUN/Creatinine Ratio: 22 (ref 12–28)
BUN: 21 mg/dL (ref 8–27)
Bilirubin Total: 0.4 mg/dL (ref 0.0–1.2)
CO2: 27 mmol/L (ref 20–29)
Calcium: 9.5 mg/dL (ref 8.7–10.3)
Chloride: 96 mmol/L (ref 96–106)
Creatinine, Ser: 0.94 mg/dL (ref 0.57–1.00)
Globulin, Total: 3.9 g/dL (ref 1.5–4.5)
Glucose: 126 mg/dL — ABNORMAL HIGH (ref 65–99)
Potassium: 3.8 mmol/L (ref 3.5–5.2)
Sodium: 141 mmol/L (ref 134–144)
Total Protein: 8.3 g/dL (ref 6.0–8.5)
eGFR: 64 mL/min/{1.73_m2} (ref >59)

## 2020-10-01 LAB — HEMOGLOBIN A1C
Est. average glucose Bld gHb Est-mCnc: 174 mg/dL
Hgb A1c MFr Bld: 7.7 % — ABNORMAL HIGH (ref 4.8–5.6)

## 2020-10-01 LAB — LEVETIRACETAM LEVEL: Levetiracetam Lvl: 17.5 ug/mL (ref 10.0–40.0)

## 2020-10-01 LAB — LAMOTRIGINE LEVEL: Lamotrigine Lvl: 10 ug/mL (ref 2.0–20.0)

## 2020-10-10 ENCOUNTER — Ambulatory Visit: Payer: Self-pay

## 2020-10-10 ENCOUNTER — Telehealth: Payer: Medicare HMO

## 2020-10-10 DIAGNOSIS — E1122 Type 2 diabetes mellitus with diabetic chronic kidney disease: Secondary | ICD-10-CM

## 2020-10-10 DIAGNOSIS — N182 Chronic kidney disease, stage 2 (mild): Secondary | ICD-10-CM

## 2020-10-10 DIAGNOSIS — F339 Major depressive disorder, recurrent, unspecified: Secondary | ICD-10-CM

## 2020-10-10 DIAGNOSIS — Z794 Long term (current) use of insulin: Secondary | ICD-10-CM

## 2020-10-10 DIAGNOSIS — I5032 Chronic diastolic (congestive) heart failure: Secondary | ICD-10-CM | POA: Diagnosis not present

## 2020-10-10 DIAGNOSIS — G40909 Epilepsy, unspecified, not intractable, without status epilepticus: Secondary | ICD-10-CM

## 2020-10-12 NOTE — Patient Instructions (Signed)
Goals Addressed    . Manage OSA with usage of CPAP   On track    Timeframe:  Long-Range Goal Priority:  High Start Date:  06/27/20                           Expected End Date: 01/20/21        Follow up date: 01/16/21   . Wear her CPAP machine as directed  . Report barriers that may prevent CPAP adherence                 . COMPLETED: Manage symptoms of depression and Assist with Housing       Timeframe:  Short-Term Goal Priority:  High Start Date:  06/27/20                           Expected End Date:  12/25/20                        Follow up date: 08/08/20  -work with embedded BSW to address housing needs if needed  -notify PCP of new or worsening symptoms of anxiety or depression  -take medications exactly as prescribed without missed doses      . Monitor and Manage My Blood Sugar-Diabetes Type 2   On track    Timeframe:  Long-Range Goal Priority:  High Start Date: 06/27/20                       Expected End Date: 01/20/21                  Follow Up Date: 01/16/21    - check blood sugar at prescribed times - check blood sugar if I feel it is too high or too low - take the blood sugar log to all doctor visits - take the blood sugar meter to all doctor visits  Self administers oral medications as prescribed Self administers insulin as prescribed Attends all scheduled provider appointments Checks blood sugars as prescribed and utilize hyper and hypoglycemia protocol as needed Adheres to prescribed ADA/carb modified  Why is this important?    Checking your blood sugar at home helps to keep it from getting very high or very low.   Writing the results in a diary or log helps the doctor know how to care for you.   Your blood sugar log should have the time, date and the results.   Also, write down the amount of insulin or other medicine that you take.   Other information, like what you ate, exercise done and how you were feeling, will also be helpful.     Notes:     . Track and  Manage Symptoms-Heart Failure   On track    Timeframe:  Long-Range Goal Priority:  High Start Date: 06/27/20                         Expected End Date:  01/20/21                    Follow Up Date: 01/16/21   - develop a rescue plan - eat more whole grains, fruits and vegetables, lean meats and healthy fats - follow rescue plan if symptoms flare-up - know when to call the doctor - track symptoms and what helps feel better or worse  -  take Jardiance as directed by Cardiologist    Why is this important?    You will be able to handle your symptoms better if you keep track of them.   Making some simple changes to your lifestyle will help.   Eating healthy is one thing you can do to take good care of yourself.    Notes:

## 2020-10-12 NOTE — Chronic Care Management (AMB) (Signed)
Chronic Care Management   CCM RN Visit Note  10/10/2020 Name: [name] MRN: [id number] DOB: [birth year]  Subjective: [name] is a 75 y.o. year old female who is a primary care patient of [name], MD. The care management team was consulted for assistance with disease management and care coordination needs.    Engaged with patient by telephone for follow up visit in response to provider referral for case management and/or care coordination services.   Consent to Services:  The patient was given information about Chronic Care Management services, agreed to services, and gave verbal consent prior to initiation of services.  Please see initial visit note for detailed documentation.   Patient agreed to services and verbal consent obtained.   Assessment: Review of patient past medical history, allergies, medications, health status, including review of consultants reports, laboratory and other test data, was performed as part of comprehensive evaluation and provision of chronic care management services.   SDOH (Social Determinants of Health) assessments and interventions performed: Yes, no acute challenges identified  CCM Care Plan  Allergies  Allergen Reactions  . Other Anaphylaxis and Rash    Bleach  . Penicillins Hives    Did it involve swelling of the face/tongue/throat, SOB, or low BP? No Did it involve sudden or severe rash/hives, skin peeling, or any reaction on the inside of your mouth or nose? Yes Did you need to seek medical attention at a hospital or doctor's office? Yes When did it last happen?Childhood allergy  If all above answers are "NO", may proceed with cephalosporin use.    . Sulfa Antibiotics Hives  . Aspirin Other (See Comments)     On aspirin 81 mg - Rectal bleeding in dec 2018  . Codeine     Headache and makes the patient feel "off"    Outpatient Encounter Medications as of 10/10/2020  Medication Sig  . acetaminophen (TYLENOL) 500  MG tablet Take 500-1,000 mg by mouth every 4 (four) hours as needed for moderate pain.   Marland Kitchen albuterol (VENTOLIN HFA) 108 (90 Base) MCG/ACT inhaler Inhale 1-2 puffs into the lungs every 6 (six) hours as needed for wheezing or shortness of breath.  . Alcohol Swabs (B-D SINGLE USE SWABS REGULAR) PADS USE TWICE DAILY AS DIRECTED  . Ascorbic Acid (VITAMIN C WITH ROSE HIPS) 1000 MG tablet Take 1,000 mg by mouth daily.  Marland Kitchen atorvastatin (LIPITOR) 10 MG tablet TAKE 1 TABLET EVERY EVENING.  . B-D ULTRAFINE III SHORT PEN 31G X 8 MM MISC USE DAILY WITH VICTOZA AND/OR NOVOLOG.  . Blood Glucose Calibration (TRUE METRIX LEVEL 1) Low SOLN Use as directed  Dx:e11.22  . Blood Glucose Monitoring Suppl (TRUE METRIX METER) w/Device KIT Use as directed to check blood sugars 2 times per day dx: e11.22  . carvedilol (COREG) 25 MG tablet TAKE 1 TABLET TWICE DAILY WITH A MEAL  . Cholecalciferol (VITAMIN D-3) 1000 units CAPS Take 5,000 Units by mouth daily.   Marland Kitchen dexlansoprazole (DEXILANT) 60 MG capsule Take 1 capsule (60 mg total) by mouth daily.  . diclofenac sodium (VOLTAREN) 1 % GEL Apply 2 g topically 4 (four) times daily as needed (pain).  . ferrous sulfate 324 MG TBEC Take 324 mg by mouth 2 (two) times daily.  . furosemide (LASIX) 80 MG tablet Take 1 tablet (80 mg total) by mouth daily. 51m daily Monday-Friday, 430mon Saturday and Sunday  . glucose blood (TRUE METRIX BLOOD GLUCOSE TEST) test strip Use as directed to check blood  sugars 2 times per day dx: e11.22  . hydrALAZINE (APRESOLINE) 25 MG tablet Take 4 tablets (100 mg total) by mouth 3 (three) times daily.  . hydrocortisone cream 1 % Apply 1 application topically daily as needed for itching.   . insulin glargine, 2 Unit Dial, (TOUJEO MAX SOLOSTAR) 300 UNIT/ML Solostar Pen Inject 25 Units into the skin at bedtime.  . insulin lispro (HUMALOG KWIKPEN) 100 UNIT/ML KwikPen Inject 0.02-0.04 mLs (2-4 Units total) into the skin 3 (three) times daily as needed (high blood  sugar over 150).  . Isopropyl Alcohol (ALCOHOL WIPES) 70 % MISC Apply 1 each topically 2 (two) times daily.  . isosorbide mononitrate (IMDUR) 30 MG 24 hr tablet Take 1 tablet (30 mg total) by mouth daily.  Marland Kitchen lamoTRIgine (LAMICTAL) 150 MG tablet Take 1 tablet (150 mg total) by mouth 2 (two) times daily.  Marland Kitchen levETIRAcetam (KEPPRA) 500 MG tablet TAKE 2 TABLETS AT BEDTIME  . Menthol, Topical Analgesic, (STOPAIN ROLL-ON EX) Apply 1 application topically daily as needed (pain).  Marland Kitchen neomycin-bacitracin-polymyxin (NEOSPORIN) ointment Apply 1 application topically as needed for wound care.   . ondansetron (ZOFRAN) 4 MG tablet   . OZEMPIC, 1 MG/DOSE, 4 MG/3ML SOPN INJECT 1MG INTO THE SKIN ONCE A WEEK  . Pancrelipase, Lip-Prot-Amyl, (ZENPEP PO) Take by mouth.  . Pancrelipase, Lip-Prot-Amyl, 25000-79000 units CPEP Take 1-2 capsules by mouth See admin instructions. Take 2 capsules in the morning, 1 capsule with lunch, and 2 capsules in the evening.  . potassium chloride SA (KLOR-CON) 20 MEQ tablet TAKE 2 TABLETS EVERY DAY BUT WHEN TAKING METOLAZONE TAKE 3 TABLETS DAILY AS DIRECTED  . sennosides-docusate sodium (SENOKOT-S) 8.6-50 MG tablet Take 1 tablet by mouth daily.  . sertraline (ZOLOFT) 50 MG tablet TAKE 1 TABLET EVERY DAY  . tolnaftate (TINACTIN) 1 % cream Apply 1 application topically daily as needed (foot fungus).   . TRUEplus Lancets 33G MISC Use as directed to check blood sugars 2 times per day dx: e11.22   No facility-administered encounter medications on file as of 10/10/2020.    Patient Active Problem List   Diagnosis Date Noted  . Postinflammatory pulmonary fibrosis ([place]) 06/08/2020  . Lung nodule 06/08/2020  . Chronic kidney disease, stage 2 (mild) 01/05/2020  . Hypertensive chronic kidney disease with stage 1 through stage 4 chronic kidney disease, or unspecified chronic kidney disease 01/05/2020  . Snoring 01/05/2020  . Neuroleptic-induced parkinsonism ([place]) 09/28/2019  . Class 3 severe  obesity due to excess calories with serious comorbidity and body mass index (BMI) of 40.0 to 44.9 in adult [facility]) 05/22/2019  . Pulmonary hypertension, unspecified ([place]) 04/06/2019  . Syncope 02/24/2019  . Rheumatoid arthritis ([place]) 01/21/2019  . Radial styloid tenosynovitis 11/24/2018  . Type 2 diabetes mellitus with stage 2 chronic kidney disease, with long-term current use of insulin ([place]) 07/21/2018  . Chest pain 05/15/2018  . 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 Excessive daytime sleepiness 01/30/2018  . Sleep-related hypoventilation 01/30/2018  . Recurrent depression ([place]) 11/06/2017  . OSA on CPAP 10/07/2017  . Todd's paralysis ([place])   . Chronic diastolic CHF (congestive heart failure), NYHA class 2 ([place])   . Chronic respiratory failure with hypoxia ([place])   . Acute lower GI bleeding   . Stroke-like symptoms 06/15/2017  . Rectal bleeding 06/15/2017  . Dehydration 03/07/2017  . Medication management 12/24/2016  . Near syncope 03/09/2016  . Racing heart beat 03/09/2016  . Dyspnea 01/28/2016  .  Spinal stenosis at L4-L5 level 10/04/2014  . Parkinsonism ([place]) 06/24/2013  . Lower abdominal pain 10/31/2012  . Type II diabetes mellitus, uncontrolled ([place]) 10/31/2012  . Seizure disorder ([place]) 10/31/2012  . Diverticulosis 10/31/2012  . Anemia associated with acute blood loss 08/14/2012  . Hematochezia 08/14/2012  . Varicose veins of bilateral lower extremities with other complications 41/32/4401  . Leg pain 03/21/2012  . Radicular pain of left lower extremity 03/21/2012  . Heart murmur 11/21/2011  . Infected pseudocyst of pancreas 10/28/2011  . Chronic pancreatitis ([place]) 10/28/2011  . Hypertensive heart disease with chronic diastolic congestive heart failure ([place]) 10/28/2011  . Dyslipidemia, goal LDL below 100 10/28/2011  . Morbid obesity with BMI of 45.0-49.9, adult ([place]) 10/28/2011  . Pseudoseizure 10/24/2011  .  Gastroesophageal reflux 10/23/2011  . History of tobacco abuse 10/23/2011  . Hypoxemia 10/23/2011  . Disturbance in sleep behavior 09/03/2011  . Partial epilepsy with impairment of consciousness ([place]) 09/03/2011  . Tremor 09/03/2011  . Peripheral edema 04/14/2011  . Obesity hypoventilation syndrome ([place]) 10/26/2010  . Atherosclerotic heart disease of native coronary artery with other forms of angina pectoris ([place]) 08/28/2010  . Acute, but ill-defined, cerebrovascular disease 05/04/2008  . Arthropathy 05/04/2008  . Disequilibrium 05/04/2008  . Generalized tonic clonic epilepsy ([place]) 05/04/2008  . Increased frequency of urination 05/04/2008  . Insomnia, unspecified 05/04/2008  . Major depressive disorder, single episode, unspecified 05/04/2008  . Memory loss 05/04/2008  . Nausea 05/04/2008  . Other malaise and fatigue 05/04/2008  . Swelling of limb 05/04/2008    Conditions to be addressed/monitored:DM II, CHF, Seizure disorder, Recurrent depression    Care Plan : Depression (Adult)  Updates made by Lynne Logan, RN since 10/12/2020 12:00 AM  Completed 10/12/2020  Problem: Symptoms (Depression) Resolved 10/10/2020  Priority: High    Goal: Symptoms Monitored and Managed Completed 10/10/2020  Start Date: 06/16/2020  Expected End Date: 07/18/2020  Recent Progress: On track  Priority: High  Note:   Current Barriers:   Ineffective Self Health Maintenance  Currently UNABLE TO independently self manage needs related to chronic health conditions.   Knowledge Deficits related to short term plan for care coordination needs and long term plans for chronic disease management needs Nurse Case Manager Clinical Goal(s):   Over the next 90 days, patient will work with care management team to address care coordination and chronic disease management needs related to Disease Management  Educational Needs  Care Coordination  Medication Management and Education  Psychosocial Support    Interventions:  10/10/20 completed successful call with patient  . Determined patient feels her relationship strain with her sister is improving and they have agreed to resolve their differences . Determined Ms. Butzin plans to stay in her sister's home at this time . Offered to provide a face to face meeting with PCP and the CCM team to offer support and resources to assist with ALF and or housing . Determined Ms. Christine will decline at this time but is appreciative of the support . Provided the direct contact information to patient for the CCM RN and CCM BSW to call if needed for additional support and or resources  . Discussed ongoing contact with patient periodically for disease education and support for chronic health conditions and psychosocial needs . Sent in basket message to PCP and embedded [place] with an update on patient's housing status and her plans to stay in her sister's home  Patient Goals/Self Care Activities Over the next 90 days, patient  will: -work with embedded BSW to address housing needs if needed -notify PCP of new or worsening symptoms of anxiety or depression  -take medications exactly as prescribed without missed doses    Care Plan : Heart Failure (Adult)  Updates made by Lynne Logan, RN since 10/12/2020 12:00 AM    Problem: Activity Tolerance (Heart Failure)   Priority: High  [place] Date: 06/27/2020    Long-Range Goal: Activity Tolerance Optimized   Start Date: 06/27/2020  Expected End Date: 01/20/2021  Recent Progress: On track  Priority: High  Note:   Current Barriers:  Marland Kitchen Knowledge deficit related to basic heart failure pathophysiology and self care management . Knowledge Deficits related to heart failure medications . Activity Intolerance  Case Manager Clinical Goal(s):  Marland Kitchen Over the next 180 days, patient will verbalize understanding of Heart Failure Action Plan and when to call doctor Interventions:  10/10/20 completed successful call with  patient  . Provided education to patient about basic disease process related to CHF . Review of patient status, including review of consultants reports, relevant laboratory and other test results, and medications completed. . Reviewed medications with patient and discussed importance of medication adherence . Provided verbal education on low sodium diet . Reviewed Heart Failure Action Plan in depth and provided written copy . Discussed importance of daily weight and advised patient to weigh and record daily  Promoted daily physical activity that improves functional ability, cognition and quality of life.   Encouraged optimal, safe functional mobility and self-care performance based on ability and tolerance.    Promoted breathing and energy conservation techniques, such as pursed-lip breathing, preplanning and pacing of activity, balancing activity and rest.   Determined patient is weighing daily and recording her weights, patient is able to verbalize the correct CHF protocol for weight gain and when to call the doctor  Determined patient was instructed by [facility] nurse what dosage of Furosemide to take with weight gain per CHF protocol  Discussed plans with patient for ongoing care management follow up and provided patient with direct contact information for care management team Patient Goals/Self-Care Activities . Over the next 180 days, patient will:   - Takes Heart Failure Medications as prescribed Weighs daily and record (notifying MD of 3 lb weight gain over night or 5 lb in a week) Verbalizes understanding of and follows CHF Action Plan Adheres to low sodium diet - develop a rescue plan - eat more whole grains, fruits and vegetables, lean meats and healthy fats - follow rescue plan if symptoms flare-up - know when to call the doctor - track symptoms and what helps feel better or worse - take Jardiance as directed by Cardiologist  Follow Up Plan: Telephone follow up  appointment with care management team member scheduled for:  01/16/21   Problem: Obstructive Sleep Apnea (Heart Failure)   Priority: High    Long-Range Goal: Effective Breathing Pattern During Sleep   Start Date: 06/27/2020  Expected End Date: 01/20/2021  Recent Progress: On track  Priority: High  Note:   Current Barriers:   Ineffective Self Health Maintenance  Activity Intolerance   Remote Smoker   Currently UNABLE TO independently self manage needs related to chronic health conditions.   Knowledge Deficits related to short term plan for care coordination needs and long term plans for chronic disease management needs Nurse Case Manager Clinical Goal(s):   Over the next 180 days, patient will work with care management team to address care coordination and chronic disease  management needs related to Disease Management  Educational Needs  Care Coordination  Medication Management and Education  Psychosocial Support   Interventions:  10/10/20 completed call with patient  . Provided education to patient about basic disease process related to OSA . Review of patient status, including review of consultants reports, relevant laboratory and other test results, and medications completed. . Reviewed medications with patient and discussed importance of medication adherence  Encouraged a nonsupine sleep position, such as side-lying, head of bed elevated, multiple pillows, to prevent airway collapse.   Assessed for adherence to CPAP usage as prescribed  Determined patient is wearing her CPAP as directed, she denies having barriers that would cause non-adherence  Discussed plans with patient for ongoing care management follow up and provided patient with direct contact information for care management team Patient Goals/Self Care Activities: l . Wear her CPAP machine as directed  . Report barriers that may prevent CPAP adherence  Follow Up Plan: Telephone follow up appointment with care  management team member scheduled for:  01/16/21   Care Plan : Diabetes Type 2 (Adult)  Updates made by Lynne Logan, RN since 10/12/2020 12:00 AM    Problem: Disease Progression (Diabetes, Type 2)   Priority: High    Long-Range Goal: Disease Progression Prevented or Minimized   Start Date: 06/27/2020  Expected End Date: 01/20/2021  Recent Progress: On track  Priority: High  Note:   Objective:  Lab Results  Component Value Date   HGBA1C 6.5 (H) 06/06/2020 .   Lab Results  Component Value Date   CREATININE 0.88 06/20/2020   CREATININE 0.89 06/06/2020   CREATININE 0.97 03/09/2020 .   Marland Kitchen No results found for: EGFR Current Barriers:  Marland Kitchen Knowledge Deficits related to basic Diabetes pathophysiology and self care/management . Knowledge Deficits related to medications used for management of diabetes . Activity Intolerance . Remote Smoker  Case Manager Clinical Goal(s):  Marland Kitchen Over the next 180 days, patient will demonstrate improved adherence to prescribed treatment plan for diabetes self care/management as evidenced by:  . daily monitoring and recording of CBG  . adherence to ADA/ carb modified diet . adherence to prescribed medication regimen Interventions:  10/10/20 successful call completed with patient  . Provided education to patient about basic DM disease process . Review of patient status, including review of consultants reports, relevant laboratory and other test results, and medications completed. . Reviewed medications with patient and discussed importance of medication adherence . Confirmed patient received and reviewed printed educational materials related to hypo and hyperglycemia and importance of correct treatment . Advised patient, providing education and rationale, to check cbg before meals and at bedtime and record, calling the CCM team and or PCP for findings outside established parameters.   . Encouraged patient to change lifestyle such as increasing intake of  plant-based foods, stress reduction, consistent physical activity and smoking cessation to prevent long-term complications and chronic disease.   . Discussed plans with patient for ongoing care management follow up and provided patient with direct contact information for care management team Patient Goals/Self-Care Activities . Over the next 180 days, patient will:  - Self administers oral medications as prescribed Self administers insulin as prescribed Attends all scheduled provider appointments Checks blood sugars as prescribed and utilize hyper and hypoglycemia protocol as needed Adheres to prescribed ADA/carb modified - check blood sugar at prescribed times - check blood sugar if I feel it is too high or too low - take the blood sugar log to  all doctor visits - take the blood sugar meter to all doctor visits  Follow Up Plan: Telephone follow up appointment with care management team member scheduled for: 01/20/21    Plan:Telephone follow up appointment with care management team member scheduled for:  01/16/21  Barb Merino, RN, BSN, CCM Care Management Coordinator [place] [facility]  Direct Phone: [phone]

## 2020-10-13 ENCOUNTER — Telehealth: Payer: Medicare HMO

## 2020-10-13 ENCOUNTER — Ambulatory Visit: Payer: Medicare HMO

## 2020-10-13 DIAGNOSIS — E1122 Type 2 diabetes mellitus with diabetic chronic kidney disease: Secondary | ICD-10-CM | POA: Diagnosis not present

## 2020-10-13 DIAGNOSIS — Z794 Long term (current) use of insulin: Secondary | ICD-10-CM | POA: Diagnosis not present

## 2020-10-13 DIAGNOSIS — N182 Chronic kidney disease, stage 2 (mild): Secondary | ICD-10-CM | POA: Diagnosis not present

## 2020-10-13 DIAGNOSIS — F339 Major depressive disorder, recurrent, unspecified: Secondary | ICD-10-CM | POA: Diagnosis not present

## 2020-10-13 DIAGNOSIS — I5032 Chronic diastolic (congestive) heart failure: Secondary | ICD-10-CM | POA: Diagnosis not present

## 2020-10-13 NOTE — Patient Instructions (Signed)
Social Worker Visit Information  Goals we discussed today:  Goals Addressed            This Visit's Progress   . Transportation Barriers to Treatment Identified and Managed   On track    Timeframe:  Short-Term Goal Priority:  High Start Date:  4.4.22                           Expected End Date: 5.19.22                       Next planned outreach 5.24.22  Patient Goals/Self-Care Activities . Over the next 30 days, patient will:   - Patient will attend all scheduled provider appointments -Work with SW to obtain SCAT approval    . Work with SW to manage care coordination needs       Timeframe:  Long-Range Goal Priority:  [employer] Start Date: 2.3.22                            Expected End Date: 6.3.22                       Next planned outreach: 5.24.22  Patient Goals/Self-Care Activities Over the next 60 days, patient will:   - Patient will self administer medications as prescribed -Patient will attend all scheduled provider appointments -Patient will call provider office for new concerns or questions -Contact SW as needed prior to next scheduled call Schedule an office visit as needed to address knee pain        Materials Provided: Verbal education about SCAT provided face to face  Follow Up Plan: SW will follow up with patient by phone over the next month   Daneen Schick, BSW, CDP Social Worker, Certified Dementia Practitioner [place] [facility] [place] [facility] [phone]

## 2020-10-13 NOTE — Chronic Care Management (AMB) (Signed)
Chronic Care Management    Social Work Note  10/13/2020 Name: KHYLEI WILMS MRN: [id number] DOB: 1945-10-19  FILICIA SCOGIN is a 75 y.o. year old female who is a primary care patient of [name], MD. The CCM team was consulted to assist the patient with chronic disease management and/or care coordination needs related to: Transportation Needs .   Engaged with patient by telephone for follow up visit in response to provider referral for social work chronic care management and care coordination services.   Consent to Services:  The patient was given information about Chronic Care Management services, agreed to services, and gave verbal consent prior to initiation of services.  Please see initial visit note for detailed documentation.   Patient agreed to services and consent obtained.   Assessment: Review of patient past medical history, allergies, medications, and health status, including review of relevant consultants reports was performed today as part of a comprehensive evaluation and provision of chronic care management and care coordination services.     SDOH (Social Determinants of Health) assessments and interventions performed:    Advanced Directives Status: See Vynca application for related entries.  CCM Care Plan  Allergies  Allergen Reactions  . Other Anaphylaxis and Rash    Bleach  . Penicillins Hives    Did it involve swelling of the face/tongue/throat, SOB, or low BP? No Did it involve sudden or severe rash/hives, skin peeling, or any reaction on the inside of your mouth or nose? Yes Did you need to seek medical attention at a hospital or doctor's office? Yes When did it last happen?Childhood allergy  If all above answers are "NO", may proceed with cephalosporin use.    . Sulfa Antibiotics Hives  . Aspirin Other (See Comments)     On aspirin 81 mg - Rectal bleeding in dec 2018  . Codeine     Headache and makes the patient feel "off"    Outpatient  Encounter Medications as of 10/13/2020  Medication Sig  . acetaminophen (TYLENOL) 500 MG tablet Take 500-1,000 mg by mouth every 4 (four) hours as needed for moderate pain.   Marland Kitchen albuterol (VENTOLIN HFA) 108 (90 Base) MCG/ACT inhaler Inhale 1-2 puffs into the lungs every 6 (six) hours as needed for wheezing or shortness of breath.  . Alcohol Swabs (B-D SINGLE USE SWABS REGULAR) PADS USE TWICE DAILY AS DIRECTED  . Ascorbic Acid (VITAMIN C WITH ROSE HIPS) 1000 MG tablet Take 1,000 mg by mouth daily.  Marland Kitchen atorvastatin (LIPITOR) 10 MG tablet TAKE 1 TABLET EVERY EVENING.  . B-D ULTRAFINE III SHORT PEN 31G X 8 MM MISC USE DAILY WITH VICTOZA AND/OR NOVOLOG.  . Blood Glucose Calibration (TRUE METRIX LEVEL 1) Low SOLN Use as directed  Dx:e11.22  . Blood Glucose Monitoring Suppl (TRUE METRIX METER) w/Device KIT Use as directed to check blood sugars 2 times per day dx: e11.22  . carvedilol (COREG) 25 MG tablet TAKE 1 TABLET TWICE DAILY WITH A MEAL  . Cholecalciferol (VITAMIN D-3) 1000 units CAPS Take 5,000 Units by mouth daily.   Marland Kitchen dexlansoprazole (DEXILANT) 60 MG capsule Take 1 capsule (60 mg total) by mouth daily.  . diclofenac sodium (VOLTAREN) 1 % GEL Apply 2 g topically 4 (four) times daily as needed (pain).  . ferrous sulfate 324 MG TBEC Take 324 mg by mouth 2 (two) times daily.  . furosemide (LASIX) 80 MG tablet Take 1 tablet (80 mg total) by mouth daily. 36m daily Monday-Friday, 464mon Saturday and  Sunday  . glucose blood (TRUE METRIX BLOOD GLUCOSE TEST) test strip Use as directed to check blood sugars 2 times per day dx: e11.22  . hydrALAZINE (APRESOLINE) 25 MG tablet Take 4 tablets (100 mg total) by mouth 3 (three) times daily.  . hydrocortisone cream 1 % Apply 1 application topically daily as needed for itching.   . insulin glargine, 2 Unit Dial, (TOUJEO MAX SOLOSTAR) 300 UNIT/ML Solostar Pen Inject 25 Units into the skin at bedtime.  . insulin lispro (HUMALOG KWIKPEN) 100 UNIT/ML KwikPen Inject  0.02-0.04 mLs (2-4 Units total) into the skin 3 (three) times daily as needed (high blood sugar over 150).  . Isopropyl Alcohol (ALCOHOL WIPES) 70 % MISC Apply 1 each topically 2 (two) times daily.  . isosorbide mononitrate (IMDUR) 30 MG 24 hr tablet Take 1 tablet (30 mg total) by mouth daily.  Marland Kitchen lamoTRIgine (LAMICTAL) 150 MG tablet Take 1 tablet (150 mg total) by mouth 2 (two) times daily.  Marland Kitchen levETIRAcetam (KEPPRA) 500 MG tablet TAKE 2 TABLETS AT BEDTIME  . Menthol, Topical Analgesic, (STOPAIN ROLL-ON EX) Apply 1 application topically daily as needed (pain).  Marland Kitchen neomycin-bacitracin-polymyxin (NEOSPORIN) ointment Apply 1 application topically as needed for wound care.   . ondansetron (ZOFRAN) 4 MG tablet   . OZEMPIC, 1 MG/DOSE, 4 MG/3ML SOPN INJECT 1MG INTO THE SKIN ONCE A WEEK  . Pancrelipase, Lip-Prot-Amyl, (ZENPEP PO) Take by mouth.  . Pancrelipase, Lip-Prot-Amyl, 25000-79000 units CPEP Take 1-2 capsules by mouth See admin instructions. Take 2 capsules in the morning, 1 capsule with lunch, and 2 capsules in the evening.  . potassium chloride SA (KLOR-CON) 20 MEQ tablet TAKE 2 TABLETS EVERY DAY BUT WHEN TAKING METOLAZONE TAKE 3 TABLETS DAILY AS DIRECTED  . sennosides-docusate sodium (SENOKOT-S) 8.6-50 MG tablet Take 1 tablet by mouth daily.  . sertraline (ZOLOFT) 50 MG tablet TAKE 1 TABLET EVERY DAY  . tolnaftate (TINACTIN) 1 % cream Apply 1 application topically daily as needed (foot fungus).   . TRUEplus Lancets 33G MISC Use as directed to check blood sugars 2 times per day dx: e11.22   No facility-administered encounter medications on file as of 10/13/2020.    Patient Active Problem List   Diagnosis Date Noted  . Postinflammatory pulmonary fibrosis ([place]) 06/08/2020  . Lung nodule 06/08/2020  . Chronic kidney disease, stage 2 (mild) 01/05/2020  . Hypertensive chronic kidney disease with stage 1 through stage 4 chronic kidney disease, or unspecified chronic kidney disease 01/05/2020  .  Snoring 01/05/2020  . Neuroleptic-induced parkinsonism ([place]) 09/28/2019  . Class 3 severe obesity due to excess calories with serious comorbidity and body mass index (BMI) of 40.0 to 44.9 in adult [facility]) 05/22/2019  . Pulmonary hypertension, unspecified ([place]) 04/06/2019  . Syncope 02/24/2019  . Rheumatoid arthritis ([place]) 01/21/2019  . Radial styloid tenosynovitis 11/24/2018  . Type 2 diabetes mellitus with stage 2 chronic kidney disease, with long-term current use of insulin ([place]) 07/21/2018  . Chest pain 05/15/2018  . 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 Excessive daytime sleepiness 01/30/2018  . Sleep-related hypoventilation 01/30/2018  . Recurrent depression ([place]) 11/06/2017  . OSA on CPAP 10/07/2017  . Todd's paralysis ([place])   . Chronic diastolic CHF (congestive heart failure), NYHA class 2 ([place])   . Chronic respiratory failure with hypoxia ([place])   . Acute lower GI bleeding   . Stroke-like symptoms 06/15/2017  . Rectal bleeding 06/15/2017  . Dehydration 03/07/2017  .  Medication management 12/24/2016  . Near syncope 03/09/2016  . Racing heart beat 03/09/2016  . Dyspnea 01/28/2016  . Spinal stenosis at L4-L5 level 10/04/2014  . Parkinsonism ([place]) 06/24/2013  . Lower abdominal pain 10/31/2012  . Type II diabetes mellitus, uncontrolled ([place]) 10/31/2012  . Seizure disorder ([place]) 10/31/2012  . Diverticulosis 10/31/2012  . Anemia associated with acute blood loss 08/14/2012  . Hematochezia 08/14/2012  . Varicose veins of bilateral lower extremities with other complications 21/22/4825  . Leg pain 03/21/2012  . Radicular pain of left lower extremity 03/21/2012  . Heart murmur 11/21/2011  . Infected pseudocyst of pancreas 10/28/2011  . Chronic pancreatitis ([place]) 10/28/2011  . Hypertensive heart disease with chronic diastolic congestive heart failure ([place]) 10/28/2011  . Dyslipidemia, goal LDL below 100 10/28/2011  .  Morbid obesity with BMI of 45.0-49.9, adult ([place]) 10/28/2011  . Pseudoseizure 10/24/2011  . Gastroesophageal reflux 10/23/2011  . History of tobacco abuse 10/23/2011  . Hypoxemia 10/23/2011  . Disturbance in sleep behavior 09/03/2011  . Partial epilepsy with impairment of consciousness ([place]) 09/03/2011  . Tremor 09/03/2011  . Peripheral edema 04/14/2011  . Obesity hypoventilation syndrome ([place]) 10/26/2010  . Atherosclerotic heart disease of native coronary artery with other forms of angina pectoris ([place]) 08/28/2010  . Acute, but ill-defined, cerebrovascular disease 05/04/2008  . Arthropathy 05/04/2008  . Disequilibrium 05/04/2008  . Generalized tonic clonic epilepsy ([place]) 05/04/2008  . Increased frequency of urination 05/04/2008  . Insomnia, unspecified 05/04/2008  . Major depressive disorder, single episode, unspecified 05/04/2008  . Memory loss 05/04/2008  . Nausea 05/04/2008  . Other malaise and fatigue 05/04/2008  . Swelling of limb 05/04/2008    Conditions to be addressed/monitored: CHF and DMII; Transportation  Care Plan : [place]  Updates made by Daneen Schick since 10/13/2020 12:00 AM    Problem: Care Coordination     Long-Range Goal: Collaborate with RN Care Manager to assist with care coordination needs   Start Date: 07/28/2020  Expected End Date: 11/25/2020  Recent Progress: On track  Priority: Medium  Note:   Current Barriers:  . Family and relationship dysfunction . Social Isolation . Chronic conditions including DM II and CHF which put patient at increased risk of hospitalization  Social Work Clinical Goal(s):  Marland Kitchen Over the next 120 days, patient will work with SW to address concerns related to care coordination  Interventions: . 1:1 collaboration with [name], MD regarding development and update of comprehensive plan of care as evidenced by provider attestation and co-signature . Inter-disciplinary care team collaboration (see  longitudinal plan of care) . Successful outbound call placed to the patient to assess for care coordination needs . Discussed the patient has been experiencing knee pain that is interrupting sleep . Advised the patient to contact her primary care provider to schedule an appointment as needed . Patient stated she feels it is due to weight gain but she will call if continues . Collaboration with RN Care Manager to inform of intervention and plan  Patient Goals/Self-Care Activities Over the next 60 days, patient will:   - Patient will self administer medications as prescribed Patient will attend all scheduled provider appointments Patient will call provider office for new concerns or questions Contact SW as needed prior to next scheduled call Schedule an OV as needed to address knee pain  Follow up Plan: SW will follow up with patient by phone over the next 30 days    Problem: Barriers to Treatment  Goal: Transportation Barriers to Treatment Identified and Managed   Start Date: 09/26/2020  Expected End Date: 11/10/2020  This Visit's Progress: On track  Recent Progress: On track  Priority: High  Note:   Current Barriers:  . Chronic disease management support and education needs related to CHF and DM  . Transportation  Social Worker Clinical Goal(s):  Marland Kitchen Over the next 45 days, patient will work with SW to identify transportation resources  CCM SW Interventions:  . Inter-disciplinary care team collaboration (see longitudinal plan of care) . Collaboration with [name], MD regarding development and update of comprehensive plan of care as evidenced by provider attestation and co-signature . Successful outbound call placed to the patient to provide verbal education on SCAT transportation benefit . Advised the patient SW has submitted a SCAT application on her behalf as instructed by the patients primary care provider following recent OV . Discussed the patient has yet to receive a  determination in the mail . Advised the patient she should receive information over the next month regarding rider eligibility and handbook . Scheduled follow up call over the next month  Patient Goals/Self-Care Activities . Over the next 30 days, patient will:   - Patient will attend all scheduled provider appointments Work with SW to obtain SCAT approval  Follow Up Plan:  SW will follow up with patient over the next 30 days       Follow Up Plan: SW will follow up with patient by phone over the next month      Daneen Schick, BSW, CDP Social Worker, Certified Dementia Practitioner [place] [facility] [phone]  Total time spent performing care coordination and/or care management activities with the patient by phone or face to face = 20 minutes.

## 2020-10-13 NOTE — Progress Notes (Deleted)
Chronic Care Management Pharmacy Note  10/13/2020 Name:  PATTIE FLAHARTY MRN:  [id number] DOB:  05-08-1946  Subjective: [name] is an 75 y.o. year old female who is a primary patient of [name], MD.  The CCM team was consulted for assistance with disease management and care coordination needs.    {CCMTELEPHONEFACETOFACE:21091510} for {CCMINITIALFOLLOWUPCHOICE:21091511} in response to provider referral for pharmacy case management and/or care coordination services.   Consent to Services:  {CCMCONSENTOPTIONS:25074}  Patient Care Team: [name], MD as PCP - General (Internal Medicine) Leonie Man, MD as PCP - Cardiology (Cardiology) Conni Slipper, NP as Referring Physician (Nurse Practitioner) Arta Silence, MD as Attending Physician (Gastroenterology) Elayne Snare, MD as Attending Physician (Endocrinology) Matilde Bash, MD as Referring Physician (Neurology) Inocencio Homes, DPM as Consulting Physician (Podiatry) Daneen Schick as Social Worker Little, Claudette Stapler, RN as Case Manager [place], Kennieth Francois, [facility] (Inactive) (Pharmacist)  Recent office visits: ***  Recent consult visits: [facility] visits: {Hospital DC Yes/No:25215}  Objective:  Lab Results  Component Value Date   CREATININE 0.94 09/26/2020   BUN 21 09/26/2020   GFR 86.68 05/15/2016   GFRNONAA 60 (L) 06/30/2020   GFRAA 75 06/06/2020   NA 141 09/26/2020   K 3.8 09/26/2020   CALCIUM 9.5 09/26/2020   CO2 27 09/26/2020   GLUCOSE 126 (H) 09/26/2020    Lab Results  Component Value Date/Time   HGBA1C 7.7 (H) 09/26/2020 11:46 AM   HGBA1C 6.5 (H) 06/06/2020 04:00 PM   FRUCTOSAMINE 290 (H) 05/15/2016 01:35 PM   FRUCTOSAMINE 282 07/01/2015 10:41 AM   GFR 86.68 05/15/2016 01:35 PM   GFR 82.17 04/17/2016 09:55 AM   MICROALBUR 10 06/06/2020 01:23 PM   MICROALBUR 10 01/06/2020 12:16 PM    Last diabetic Eye exam:  Lab Results  Component Value Date/Time   HMDIABEYEEXA No Retinopathy  02/02/2020 12:00 AM    Last diabetic Foot exam: No results found for: HMDIABFOOTEX   Lab Results  Component Value Date   CHOL 144 06/20/2020   HDL 63 06/20/2020   LDLCALC 70 06/20/2020   LDLDIRECT 66.0 01/27/2015   TRIG 55 06/20/2020   CHOLHDL 2.3 06/20/2020    Hepatic Function Latest Ref Rng & Units 09/26/2020 06/06/2020 02/25/2019  Total Protein 6.0 - 8.5 g/dL 8.3 7.6 6.8  Albumin 3.7 - 4.7 g/dL 4.4 4.0 3.3(L)  AST 0 - 40 IU/L _0 ALT 0 - 32 IU/L _1 Alk Phosphatase 44 - 121 IU/L 144(H) 127(H) 79  Total Bilirubin 0.0 - 1.2 mg/dL 0.4 0.3 0.7  Bilirubin, Direct 0.0 - 0.2 mg/dL - - -    Lab Results  Component Value Date/Time   TSH 1.500 11/09/2019 12:21 AM   TSH 1.332 02/25/2019 03:32 AM   TSH 0.768 08/13/2010 08:36 AM   TSH 1.544 ***Test methodology is 3rd generation TSH*** 05/05/2008 04:35 AM    CBC Latest Ref Rng & Units 06/06/2020 03/09/2020 01/28/2020  WBC 3.4 - 10.8 x10E3/uL 4.6 5.0 4.4  Hemoglobin 11.1 - 15.9 g/dL 10.5(L) 8.8(L) 9.1(L)  Hematocrit 34.0 - 46.6 % 34.7 29.8(L) 30.7(L)  Platelets 150 - 450 x10E3/uL 341 391 305    No results found for: VD25OH  Clinical ASCVD: {YES/NO:21197} The 10-year ASCVD risk score Mikey Bussing DC Jr., et al., 2013) is: 20%   Values used to calculate the score:     Age: 35 years     Sex: Female     Is Non-Hispanic African American:  Yes     Diabetic: Yes     Tobacco smoker: No     Systolic Blood Pressure: 237 mmHg     Is BP treated: Yes     HDL Cholesterol: 63 mg/dL     Total Cholesterol: 144 mg/dL    Depression screen [facility] 2/9 09/26/2020 09/26/2020 08/11/2020  Decreased Interest 0 0 0  Down, Depressed, Hopeless 0 0 0  PHQ - 2 Score 0 0 0  Altered sleeping 0 - -  Tired, decreased energy 0 - -  Change in appetite 0 - -  Feeling bad or failure about yourself  0 - -  Trouble concentrating 0 - -  Moving slowly or fidgety/restless 0 - -  Suicidal thoughts 0 - -  PHQ-9 Score 0 - -  Difficult doing work/chores - Not difficult at  all -  Some recent data might be hidden     ***Other: (CHADS2VASc if Afib, MMRC or CAT for COPD, ACT, DEXA)  Social History   Tobacco Use  Smoking Status Former Smoker  . Packs/day: 0.50  . Years: 25.00  . Pack years: 12.50  . Types: Cigarettes  Smokeless Tobacco Never Used  Tobacco Comment   quit smoking 20+ytrs ago   BP Readings from Last 3 Encounters:  09/26/20 118/60  06/20/20 124/70  06/08/20 138/64   Pulse Readings from Last 3 Encounters:  09/26/20 68  06/20/20 69  06/08/20 61   Wt Readings from Last 3 Encounters:  09/26/20 198 lb (89.8 kg)  08/11/20 195 lb (88.5 kg)  06/20/20 205 lb 3.2 oz (93.1 kg)   BMI Readings from Last 3 Encounters:  09/26/20 39.45 kg/m  08/11/20 36.84 kg/m  06/20/20 38.77 kg/m    Assessment/Interventions: Review of patient past medical history, allergies, medications, health status, including review of consultants reports, laboratory and other test data, was performed as part of comprehensive evaluation and provision of chronic care management services.   SDOH:  (Social Determinants of Health) assessments and interventions performed: {yes/no:20286}  SDOH Screenings   Alcohol Screen: Not on file  Depression (PHQ2-9): Low Risk   . PHQ-2 Score: 0  Financial Resource Strain: Low Risk   . Difficulty of Paying Living Expenses: Not hard at all  Food Insecurity: No Food Insecurity  . Worried About Charity fundraiser in the Last Year: Never true  . Ran Out of Food in the Last Year: Never true  Housing: Not on file  Physical Activity: Inactive  . Days of Exercise per Week: 0 days  . Minutes of Exercise per Session: 0 min  Social Connections: Not on file  Stress: No Stress Concern Present  . Feeling of Stress : Not at all  Tobacco Use: Medium Risk  . Smoking Tobacco Use: Former Smoker  . Smokeless Tobacco Use: Never Used  Transportation Needs: No Transportation Needs  . Lack of Transportation (Medical): No  . Lack of  Transportation (Non-Medical): No    CCM Care Plan  Allergies  Allergen Reactions  . Other Anaphylaxis and Rash    Bleach  . Penicillins Hives    Did it involve swelling of the face/tongue/throat, SOB, or low BP? No Did it involve sudden or severe rash/hives, skin peeling, or any reaction on the inside of your mouth or nose? Yes Did you need to seek medical attention at a hospital or doctor's office? Yes When did it last happen?Childhood allergy  If all above answers are "NO", may proceed with cephalosporin use.    Marland Kitchen  Sulfa Antibiotics Hives  . Aspirin Other (See Comments)     On aspirin 81 mg - Rectal bleeding in dec 2018  . Codeine     Headache and makes the patient feel "off"    Medications Reviewed Today    Reviewed by Lynne Logan, RN (Registered Nurse) on 10/10/20 at [place] [facility] Status: <None>  Medication Order Taking? Sig Documenting Provider Last Dose Status Informant  acetaminophen (TYLENOL) 500 MG tablet [id number] No Take 500-1,000 mg by mouth every 4 (four) hours as needed for moderate pain.  [provider] Taking Active Family Member  albuterol (VENTOLIN HFA) 108 (90 Base) MCG/ACT inhaler [id number] No Inhale 1-2 puffs into the lungs every 6 (six) hours as needed for wheezing or shortness of breath. [provider] Taking Active Family Member  Alcohol Swabs (B-D SINGLE USE SWABS REGULAR) PADS [id number]  USE TWICE DAILY AS DIRECTED [name], MD  Active   Ascorbic Acid (VITAMIN C WITH ROSE HIPS) 1000 MG tablet [id number] No Take 1,000 mg by mouth daily. [provider] Taking Active   atorvastatin (LIPITOR) 10 MG tablet [id number] No TAKE 1 TABLET EVERY EVENING. [name], MD Taking Active   B-D ULTRAFINE III SHORT PEN 31G X 8 MM MISC [id number] No USE DAILY WITH VICTOZA AND/OR NOVOLOG. [name], MD Taking Active   Blood Glucose Calibration (TRUE METRIX LEVEL 1) Low SOLN [id number] No Use as directed  Dx:e11.22 [name], MD Taking Active   Blood Glucose Monitoring Suppl (TRUE METRIX METER) w/Device KIT [id number] No Use as directed to check blood sugars 2 times per day dx: e11.22 [name], MD Taking Active   carvedilol (COREG) 25 MG tablet [id number]  TAKE 1 TABLET TWICE DAILY WITH A MEAL Leonie Man, MD  Active   Cholecalciferol (VITAMIN D-3) 1000 units CAPS [id number] No Take 5,000 Units by mouth daily.  [provider] Taking Active Family Member  dexlansoprazole (DEXILANT) 60 MG capsule [id number] No Take 1 capsule (60 mg total) by mouth daily. [name], MD Taking Active   diclofenac sodium (VOLTAREN) 1 % GEL [id number] No Apply 2 g topically 4 (four) times daily as needed (pain). Geradine Girt, DO Taking Active Family Member  ferrous sulfate 324 MG TBEC [id number] No Take 324 mg by mouth 2 (two) times daily. [provider] Taking Active   furosemide (LASIX) 80 MG tablet [id number]  Take 1 tablet (80 mg total) by mouth daily. 72m daily Monday-Friday, 461mon Saturday and Sunday McLarey DresserMD  Active   glucose blood (TRUE METRIX BLOOD GLUCOSE TEST) test strip [id number] Use as directed to check blood sugars 2 times per day dx: e11.22 SaGlendale ChardMD Taking Active   hydrALAZINE (APRESOLINE) 25 MG tablet [id number] 4 tablets (100 mg total) by mouth 3 (three) times daily. McLarey DresserMD  Active   hydrocortisone cream 1 % [id number] Apply 1 application topically daily as needed for itching.  [provider] Taking Active Family Member  insulin glargine, 2 Unit Dial, (TOUJEO MAX SOLOSTAR) 300 UNIT/ML Solostar Pen [id number] Inject 25 Units into the skin at bedtime. [provider] Taking Active   insulin lispro (HUMALOG KWIKPEN) 100 UNIT/ML KwikPen [id number] Inject 0.02-0.04 mLs (2-4 Units total) into the skin 3 (three) times daily as needed (high blood sugar over 150). SaGlendale ChardMD Taking Active Family Member  Isopropyl Alcohol  (ALCOHOL WIPES) 70 % MISC [id number] Apply 1 each  topically 2 (two) times daily. Minette Brine, FNP Taking Active Family Member  isosorbide mononitrate (IMDUR) 30 MG 24 hr tablet [id number] No Take 1 tablet (30 mg total) by mouth daily. Georgette Shell, MD Taking Active   lamoTRIgine (LAMICTAL) 150 MG tablet [id number] No Take 1 tablet (150 mg total) by mouth 2 (two) times daily. Cameron Sprang, MD Taking Active Family Member  levETIRAcetam (KEPPRA) 500 MG tablet [id number] No TAKE 2 TABLETS AT BEDTIME Cameron Sprang, MD Taking Active   Menthol, Topical Analgesic, (STOPAIN ROLL-ON EX) [id number] No Apply 1 application topically daily as needed (pain). [provider] Taking Active Family Member  neomycin-bacitracin-polymyxin (NEOSPORIN) ointment [id number] No Apply 1 application topically as needed for wound care.  [provider] Taking Active Family Member  ondansetron (ZOFRAN) 4 MG tablet [id number] No  [provider] Taking Active   OZEMPIC, 1 MG/DOSE, 4 MG/3ML SOPN [id number] No INJECT 1MG INTO THE SKIN ONCE A Dayna Barker, MD Taking Active   Pancrelipase, Lip-Prot-Amyl, (ZENPEP PO) [id number] No Take by mouth. [provider] Taking Active   Pancrelipase, Lip-Prot-Amyl, 25000-79000 units CPEP [id number] No Take 1-2 capsules by mouth See admin instructions. Take 2 capsules in the morning, 1 capsule with lunch, and 2 capsules in the evening. [provider] Taking Active Family Member  potassium chloride SA (KLOR-CON) 20 MEQ tablet [id number]  TAKE 2 TABLETS EVERY DAY BUT WHEN TAKING METOLAZONE TAKE 3 TABLETS DAILY AS DIRECTED Leonie Man, MD  Active   sennosides-docusate sodium (SENOKOT-S) 8.6-50 MG tablet [id number] No Take 1 tablet by mouth daily. [provider] Taking Active   sertraline (ZOLOFT) 50 MG tablet [id number] No TAKE 1 TABLET EVERY DAY [name], MD Taking Active   tolnaftate (TINACTIN) 1 % cream [id number] No Apply  1 application topically daily as needed (foot fungus).  [provider] Taking Active Family Member  TRUEplus Lancets [place] [id number] No Use as directed to check blood sugars 2 times per day dx: e11.22 [name], MD Taking Active           Patient Active Problem List   Diagnosis Date Noted  . Postinflammatory pulmonary fibrosis ([place]) 06/08/2020  . Lung nodule 06/08/2020  . Chronic kidney disease, stage 2 (mild) 01/05/2020  . Hypertensive chronic kidney disease with stage 1 through stage 4 chronic kidney disease, or unspecified chronic kidney disease 01/05/2020  . Snoring 01/05/2020  . Neuroleptic-induced parkinsonism ([place]) 09/28/2019  . Class 3 severe obesity due to excess calories with serious comorbidity and body mass index (BMI) of 40.0 to 44.9 in adult [facility]) 05/22/2019  . Pulmonary hypertension, unspecified ([place]) 04/06/2019  . Syncope 02/24/2019  . Rheumatoid arthritis ([place]) 01/21/2019  . Radial styloid tenosynovitis 11/24/2018  . Type 2 diabetes mellitus with stage 2 chronic kidney disease, with long-term current use of insulin ([place]) 07/21/2018  . Chest pain 05/15/2018  . 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 Excessive daytime sleepiness 01/30/2018  . Sleep-related hypoventilation 01/30/2018  . Recurrent depression ([place]) 11/06/2017  . OSA on CPAP 10/07/2017  . Todd's paralysis ([place])   . Chronic diastolic CHF (congestive heart failure), NYHA class 2 ([place])   . Chronic respiratory failure with hypoxia ([place])   . Acute lower GI bleeding   . Stroke-like symptoms 06/15/2017  . Rectal bleeding 06/15/2017  . Dehydration 03/07/2017  . Medication management 12/24/2016  . Near syncope 03/09/2016  . Racing heart beat 03/09/2016  .  Dyspnea 01/28/2016  . Spinal stenosis at L4-L5 level 10/04/2014  . Parkinsonism ([place]) 06/24/2013  . Lower abdominal pain 10/31/2012  . Type II diabetes mellitus, uncontrolled  ([place]) 10/31/2012  . Seizure disorder ([place]) 10/31/2012  . Diverticulosis 10/31/2012  . Anemia associated with acute blood loss 08/14/2012  . Hematochezia 08/14/2012  . Varicose veins of bilateral lower extremities with other complications 78/29/5621  . Leg pain 03/21/2012  . Radicular pain of left lower extremity 03/21/2012  . Heart murmur 11/21/2011  . Infected pseudocyst of pancreas 10/28/2011  . Chronic pancreatitis ([place]) 10/28/2011  . Hypertensive heart disease with chronic diastolic congestive heart failure ([place]) 10/28/2011  . Dyslipidemia, goal LDL below 100 10/28/2011  . Morbid obesity with BMI of 45.0-49.9, adult ([place]) 10/28/2011  . Pseudoseizure 10/24/2011  . Gastroesophageal reflux 10/23/2011  . History of tobacco abuse 10/23/2011  . Hypoxemia 10/23/2011  . Disturbance in sleep behavior 09/03/2011  . Partial epilepsy with impairment of consciousness ([place]) 09/03/2011  . Tremor 09/03/2011  . Peripheral edema 04/14/2011  . Obesity hypoventilation syndrome ([place]) 10/26/2010  . Atherosclerotic heart disease of native coronary artery with other forms of angina pectoris ([place]) 08/28/2010  . Acute, but ill-defined, cerebrovascular disease 05/04/2008  . Arthropathy 05/04/2008  . Disequilibrium 05/04/2008  . Generalized tonic clonic epilepsy ([place]) 05/04/2008  . Increased frequency of urination 05/04/2008  . Insomnia, unspecified 05/04/2008  . Major depressive disorder, single episode, unspecified 05/04/2008  . Memory loss 05/04/2008  . Nausea 05/04/2008  . Other malaise and fatigue 05/04/2008  . Swelling of limb 05/04/2008    Immunization History  Administered Date(s) Administered  . Fluad Quad(high Dose 65+) 06/06/2020  . Influenza Split 04/11/2011, 06/30/2013  . Influenza, High Dose Seasonal PF 02/14/2017, 04/24/2018  . Influenza-Unspecified 04/07/2014  . PFIZER(Purple Top)SARS-COV-2 Vaccination 08/06/2019, 08/31/2019, 05/04/2020  . Tdap 05/18/2012  . Zoster 04/14/2014     Conditions to be addressed/monitored:  {USCCMDZASSESSMENTOPTIONS:23563}  There are no care plans that you recently modified to display for this patient.    Medication Assistance: {MEDASSISTANCEINFO:25044}  Patient's preferred pharmacy is:  [place], [address] [place] [address] Phone: [phone] Fax: [phone]  [place], [address] [place] [address] Phone: [phone] Fax: [phone]  Uses pill box? {Yes or If no, why not?:20788} Pt endorses ***% compliance  We discussed: {Pharmacy options:24294} Patient decided to: {US Pharmacy Plan:23885}  Care Plan and Follow Up Patient Decision:  {FOLLOWUP:24991}  Plan: {CM FOLLOW UP PPIR:51884}  ***   Current Barriers:  . {pharmacybarriers:24917}  Pharmacist Clinical Goal(s):  Marland Kitchen Patient will {PHARMACYGOALCHOICES:24921} through collaboration with PharmD and provider.   Interventions: . 1:1 collaboration with [name], MD regarding development and update of comprehensive plan of care as evidenced by provider attestation and co-signature . Inter-disciplinary care team collaboration (see longitudinal plan of care) . Comprehensive medication review performed; medication list updated in electronic medical record  {CCM PHARMD DISEASE STATES:25130}  Patient Goals/Self-Care Activities . Patient will:  - {pharmacypatientgoals:24919}  Follow Up Plan: {CM FOLLOW UP ZYSA:63016}

## 2020-10-17 ENCOUNTER — Ambulatory Visit: Payer: Medicare HMO | Admitting: Pulmonary Disease

## 2020-10-17 ENCOUNTER — Telehealth: Payer: Self-pay

## 2020-10-17 NOTE — Telephone Encounter (Signed)
I called the pt because Dr. Sanders wants to confirm if the pt is currently taking Jardiance, note from the cardiologist states that the pt was started on Jardiance in December 

## 2020-10-18 ENCOUNTER — Telehealth: Payer: Self-pay | Admitting: Cardiology

## 2020-10-18 NOTE — Telephone Encounter (Signed)
Patient got a letter to follow up with Dr. Ellyn Hack. She states that Dr. Ellyn Hack referred her to see Dr. [name], so, she wants to know if she still needs to follow up with Dr. Ellyn Hack or if he can just get a report from Dr. [name]. Please advise.

## 2020-10-18 NOTE — Telephone Encounter (Signed)
Spoke to patient advised to continue seeing Dr.McLean.Dr.McLean will let you know when to see Dr.Harding again.Stated she was doing well.

## 2020-10-19 ENCOUNTER — Telehealth: Payer: Self-pay

## 2020-10-19 ENCOUNTER — Other Ambulatory Visit (HOSPITAL_COMMUNITY): Payer: Self-pay | Admitting: Cardiology

## 2020-10-19 DIAGNOSIS — H43813 Vitreous degeneration, bilateral: Secondary | ICD-10-CM | POA: Diagnosis not present

## 2020-10-19 DIAGNOSIS — Z9889 Other specified postprocedural states: Secondary | ICD-10-CM | POA: Diagnosis not present

## 2020-10-19 DIAGNOSIS — Z794 Long term (current) use of insulin: Secondary | ICD-10-CM | POA: Diagnosis not present

## 2020-10-19 DIAGNOSIS — Z961 Presence of intraocular lens: Secondary | ICD-10-CM | POA: Diagnosis not present

## 2020-10-19 DIAGNOSIS — H31093 Other chorioretinal scars, bilateral: Secondary | ICD-10-CM | POA: Diagnosis not present

## 2020-10-19 DIAGNOSIS — E119 Type 2 diabetes mellitus without complications: Secondary | ICD-10-CM | POA: Diagnosis not present

## 2020-10-19 NOTE — Telephone Encounter (Signed)
I called the pt because Dr. Baird Cancer wants to confirm if the pt is currently taking Jardiance, note from the cardiologist states that the pt was started on Jardiance in December

## 2020-10-20 DIAGNOSIS — I10 Essential (primary) hypertension: Secondary | ICD-10-CM | POA: Diagnosis not present

## 2020-10-20 DIAGNOSIS — R531 Weakness: Secondary | ICD-10-CM | POA: Diagnosis not present

## 2020-10-20 DIAGNOSIS — R55 Syncope and collapse: Secondary | ICD-10-CM | POA: Diagnosis not present

## 2020-10-20 DIAGNOSIS — R42 Dizziness and giddiness: Secondary | ICD-10-CM | POA: Diagnosis not present

## 2020-10-20 DIAGNOSIS — R29810 Facial weakness: Secondary | ICD-10-CM | POA: Diagnosis not present

## 2020-10-25 ENCOUNTER — Telehealth: Payer: Self-pay | Admitting: Neurology

## 2020-10-25 ENCOUNTER — Telehealth: Payer: Self-pay

## 2020-10-25 NOTE — Chronic Care Management (AMB) (Addendum)
Chronic Care Management Pharmacy Assistant   Name: [name]  MRN: [id number] DOB: 1945/07/27   Reason for Encounter: Disease State/ Hypertension  Recent office visits:  08-08-2020 Lynne Logan, RN (CCM)  08-15-2020 Daneen Schick (CCM)  09-16-2020  Daneen Schick (CCM)  09-20-2020  Daneen Schick (CCM)  09-26-2020 [name], MD Stop Zinc Sulfate 224m (Patient reported not taking)  09-26-2020 HDaneen Schick(CCM)  09-27-2020 HDaneen Schick(CCM)  09-29-2020 HDaneen Schick(CCM)  10-10-2020 Little, AClaudette Stapler RN (CCM)  10-13-2020  HDaneen Schick(CCM)   Recent consult visits:   08-11-2020 [place] Silence([place]  08-02-2020 [place]ta [place]ence[facility]    [facility]  None in previous 6 months  Medications: Outpatient Encounter Medications as of 10/25/2020  Medication Sig   acetaminophen (TYLENOL) 500 MG tablet Take 500-1,000 mg by mouth every 4 (four) hours as needed for moderate pain.    albuterol (VENTOLIN HFA) 108 (90 Base) MCG/ACT inhaler Inhale 1-2 puffs into the lungs every 6 (six) hours as needed for wheezing or shortness of breath.   Alcohol Swabs (B-D SINGLE USE SWABS REGULAR) PADS USE TWICE DAILY AS DIRECTED   Ascorbic Acid (VITAMIN C WITH ROSE HIPS) 1000 MG tablet Take 1,000 mg by mouth daily.   atorvastatin (LIPITOR) 10 MG tablet TAKE 1 TABLET EVERY EVENING.   B-D ULTRAFINE III SHORT PEN 31G X 8 MM MISC USE DAILY WITH VICTOZA AND/OR NOVOLOG.   Blood Glucose Calibration (TRUE METRIX LEVEL 1) Low SOLN Use as directed  Dx:e11.22   Blood Glucose Monitoring Suppl (TRUE METRIX METER) w/Device KIT Use as directed to check blood sugars 2 times per day dx: e11.22   carvedilol (COREG) 25 MG tablet TAKE 1 TABLET TWICE DAILY WITH A MEAL   Cholecalciferol (VITAMIN D-3) 1000 units CAPS Take 5,000 Units by mouth daily.    dexlansoprazole (DEXILANT) 60 MG capsule Take 1 capsule (60 mg total) by mouth  daily.   diclofenac sodium (VOLTAREN) 1 % GEL Apply 2 g topically 4 (four) times daily as needed (pain).   ferrous sulfate 324 MG TBEC Take 324 mg by mouth 2 (two) times daily.   furosemide (LASIX) 80 MG tablet Take 1 tablet (80 mg total) by mouth daily. 811mdaily Monday-Friday, 4087mn Saturday and Sunday   glucose blood (TRUE METRIX BLOOD GLUCOSE TEST) test strip Use as directed to check blood sugars 2 times per day dx: e11.22   hydrALAZINE (APRESOLINE) 100 MG tablet Take 1 tablet (100 mg total) by mouth 3 (three) times daily.   hydrocortisone cream 1 % Apply 1 application topically daily as needed for itching.    insulin glargine, 2 Unit Dial, (TOUJEO MAX SOLOSTAR) 300 UNIT/ML Solostar Pen Inject 25 Units into the skin at bedtime.   insulin lispro (HUMALOG KWIKPEN) 100 UNIT/ML KwikPen Inject 0.02-0.04 mLs (2-4 Units total) into the skin 3 (three) times daily as needed (high blood sugar over 150).   Isopropyl Alcohol (ALCOHOL WIPES) 70 % MISC Apply 1 each topically 2 (two) times daily.   isosorbide mononitrate (IMDUR) 30 MG 24 hr tablet Take 1 tablet (30 mg total) by mouth daily.   lamoTRIgine (LAMICTAL) 150 MG tablet Take 1 tablet (150 mg total) by mouth 2 (two) times daily.   levETIRAcetam (KEPPRA) 500 MG tablet TAKE 2 TABLETS AT BEDTIME   Menthol, Topical Analgesic, (STOPAIN ROLL-ON EX) Apply 1 application topically daily as needed (pain).   neomycin-bacitracin-polymyxin (NEOSPORIN) ointment Apply 1 application topically  as needed for wound care.    ondansetron (ZOFRAN) 4 MG tablet    OZEMPIC, 1 MG/DOSE, 4 MG/3ML SOPN INJECT 1MG INTO THE SKIN ONCE A WEEK   Pancrelipase, Lip-Prot-Amyl, (ZENPEP PO) Take by mouth.   Pancrelipase, Lip-Prot-Amyl, 25000-79000 units CPEP Take 1-2 capsules by mouth See admin instructions. Take 2 capsules in the morning, 1 capsule with lunch, and 2 capsules in the evening.   potassium chloride SA (KLOR-CON) 20 MEQ tablet TAKE 2 TABLETS EVERY DAY BUT WHEN TAKING  METOLAZONE TAKE 3 TABLETS DAILY AS DIRECTED   sennosides-docusate sodium (SENOKOT-S) 8.6-50 MG tablet Take 1 tablet by mouth daily.   sertraline (ZOLOFT) 50 MG tablet TAKE 1 TABLET EVERY DAY   tolnaftate (TINACTIN) 1 % cream Apply 1 application topically daily as needed (foot fungus).    TRUEplus Lancets 33G MISC Use as directed to check blood sugars 2 times per day dx: e11.22   No facility-administered encounter medications on file as of 10/25/2020.   Reviewed chart prior to disease state call. Spoke with patient regarding BP  Recent Office Vitals: BP Readings from Last 3 Encounters:  09/26/20 118/60  06/20/20 124/70  06/08/20 138/64   Pulse Readings from Last 3 Encounters:  09/26/20 68  06/20/20 69  06/08/20 61    Wt Readings from Last 3 Encounters:  09/26/20 198 lb (89.8 kg)  08/11/20 195 lb (88.5 kg)  06/20/20 205 lb 3.2 oz (93.1 kg)     Kidney Function Lab Results  Component Value Date/Time   CREATININE 0.94 09/26/2020 11:46 AM   CREATININE 1.00 06/30/2020 11:51 AM   CREATININE 0.91 10/04/2016 11:44 AM   GFR 86.68 05/15/2016 01:35 PM   GFRNONAA 60 (L) 06/30/2020 11:51 AM   GFRNONAA 65 10/04/2016 11:44 AM   GFRAA 75 06/06/2020 04:00 PM   GFRAA 75 10/04/2016 11:44 AM    BMP Latest Ref Rng & Units 09/26/2020 06/30/2020 06/20/2020  Glucose 65 - 99 mg/dL 126(H) 228(H) 156(H)  BUN 8 - 27 mg/dL 21 13 14   Creatinine 0.57 - 1.00 mg/dL 0.94 1.00 0.88  BUN/Creat Ratio 12 - 28 22 - -  Sodium 134 - 144 mmol/L 141 136 135  Potassium 3.5 - 5.2 mmol/L 3.8 3.9 3.5  Chloride 96 - 106 mmol/L 96 101 96(L)  CO2 20 - 29 mmol/L 27 26 30   Calcium 8.7 - 10.3 mg/dL 9.5 9.0 9.4    Current antihypertensive regimen:  Carvedilol 25 mg twice daily Hydralazine 25 mg 4 tablet 3 times daily (Patient states some days when she's out she doesn't take 3 times a day just once)  How often are you checking your Blood Pressure? daily   Current home BP readings: 136/64, 129/65  What recent  interventions/DTPs have been made by any provider to improve Blood Pressure control since last CPP Visit: Patient states she is taking medications consistently at the same time each day  Any recent hospitalizations or ED visits since last visit with CPP? No   What diet changes have been made to improve Blood Pressure Control?  Patient states she's not a breakfast eater but does eat salad and meats that are baked only.  What exercise is being done to improve your Blood Pressure Control?  Patient states she walks her dog daily around the neighborhood ,walks up and down the driveway and does chair excercises.  Adherence Review: Is the patient currently on ACE/ARB medication? No Does the patient have >5 day gap between last estimated fill dates? No  NOTES Patient's  sister states that patient is on heart doppler which she has to assist with daily for patient. Patient's sister states patient had 2 small seizures recently which were evaluated by EMS and they will contact patient's neurologist.  STAR rating drugs Atorvastatin 10 mg- Last filled 09-20-2020 90[facility] Pharmacy unknown Ozempic 68m- Last filled 07-13-2020 [facility] [phone] I have reviewed the care management and care coordination activities outlined in this encounter and I am certifying that I agree with the content of this note. No further action required.  VMayford Knife [facility]Main[facility]eral [facility] Center05/12/22 8:37 AM

## 2020-10-25 NOTE — Progress Notes (Shared)
[facility] Note  10/26/2020     CHIEF COMPLAINT Patient presents for No chief complaint on file.   HISTORY OF PRESENT ILLNESS: [name] is a 75 y.o. female who presents to the clinic today for:     Referring physician: [name], MD [address] [place],  [place] [address]  HISTORICAL INFORMATION:   Selected notes from the MEDICAL RECORD NUMBER Referred by Dr. Truman Hayward:  Ocular Hx- PMH-    CURRENT MEDICATIONS: No current outpatient medications on file. (Ophthalmic Drugs)   No current facility-administered medications for this visit. (Ophthalmic Drugs)   Current Outpatient Medications (Other)  Medication Sig  . acetaminophen (TYLENOL) 500 MG tablet Take 500-1,000 mg by mouth every 4 (four) hours as needed for moderate pain.   Marland Kitchen albuterol (VENTOLIN HFA) 108 (90 Base) MCG/ACT inhaler Inhale 1-2 puffs into the lungs every 6 (six) hours as needed for wheezing or shortness of breath.  . Alcohol Swabs (B-D SINGLE USE SWABS REGULAR) PADS USE TWICE DAILY AS DIRECTED  . Ascorbic Acid (VITAMIN C WITH ROSE HIPS) 1000 MG tablet Take 1,000 mg by mouth daily.  Marland Kitchen atorvastatin (LIPITOR) 10 MG tablet TAKE 1 TABLET EVERY EVENING.  . B-D ULTRAFINE III SHORT PEN 31G X 8 MM MISC USE DAILY WITH VICTOZA AND/OR NOVOLOG.  . Blood Glucose Calibration (TRUE METRIX LEVEL 1) Low SOLN Use as directed  Dx:e11.22  . Blood Glucose Monitoring Suppl (TRUE METRIX METER) w/Device KIT Use as directed to check blood sugars 2 times per day dx: e11.22  . carvedilol (COREG) 25 MG tablet TAKE 1 TABLET TWICE DAILY WITH A MEAL  . Cholecalciferol (VITAMIN D-3) 1000 units CAPS Take 5,000 Units by mouth daily.   Marland Kitchen dexlansoprazole (DEXILANT) 60 MG capsule Take 1 capsule (60 mg total) by mouth daily.  . diclofenac sodium (VOLTAREN) 1 % GEL Apply 2 g topically 4 (four) times daily as needed (pain).  . ferrous sulfate 324 MG TBEC Take 324 mg by mouth 2 (two) times daily.  .  furosemide (LASIX) 80 MG tablet Take 1 tablet (80 mg total) by mouth daily. 66m daily Monday-Friday, 476mon Saturday and Sunday  . glucose blood (TRUE METRIX BLOOD GLUCOSE TEST) test strip Use as directed to check blood sugars 2 times per day dx: e11.22  . hydrALAZINE (APRESOLINE) 100 MG tablet Take 1 tablet (100 mg total) by mouth 3 (three) times daily.  . hydrocortisone cream 1 % Apply 1 application topically daily as needed for itching.   . insulin glargine, 2 Unit Dial, (TOUJEO MAX SOLOSTAR) 300 UNIT/ML Solostar Pen Inject 25 Units into the skin at bedtime.  . insulin lispro (HUMALOG KWIKPEN) 100 UNIT/ML KwikPen Inject 0.02-0.04 mLs (2-4 Units total) into the skin 3 (three) times daily as needed (high blood sugar over 150).  . Isopropyl Alcohol (ALCOHOL WIPES) 70 % MISC Apply 1 each topically 2 (two) times daily.  . isosorbide mononitrate (IMDUR) 30 MG 24 hr tablet Take 1 tablet (30 mg total) by mouth daily.  . Marland KitchenamoTRIgine (LAMICTAL) 150 MG tablet Take 1 tablet (150 mg total) by mouth 2 (two) times daily.  . Marland KitchenevETIRAcetam (KEPPRA) 500 MG tablet TAKE 2 TABLETS AT BEDTIME  . Menthol, Topical Analgesic, (STOPAIN ROLL-ON EX) Apply 1 application topically daily as needed (pain).  . Marland Kitcheneomycin-bacitracin-polymyxin (NEOSPORIN) ointment Apply 1 application topically as needed for wound care.   . ondansetron (ZOFRAN) 4 MG tablet   . OZEMPIC, 1 MG/DOSE, 4 MG/3ML SOPN  INJECT 1MG INTO THE SKIN ONCE A WEEK  . Pancrelipase, Lip-Prot-Amyl, (ZENPEP PO) Take by mouth.  . Pancrelipase, Lip-Prot-Amyl, 25000-79000 units CPEP Take 1-2 capsules by mouth See admin instructions. Take 2 capsules in the morning, 1 capsule with lunch, and 2 capsules in the evening.  . potassium chloride SA (KLOR-CON) 20 MEQ tablet TAKE 2 TABLETS EVERY DAY BUT WHEN TAKING METOLAZONE TAKE 3 TABLETS DAILY AS DIRECTED  . sennosides-docusate sodium (SENOKOT-S) 8.6-50 MG tablet Take 1 tablet by mouth daily.  . sertraline (ZOLOFT) 50 MG tablet  TAKE 1 TABLET EVERY DAY  . tolnaftate (TINACTIN) 1 % cream Apply 1 application topically daily as needed (foot fungus).   . TRUEplus Lancets 33G MISC Use as directed to check blood sugars 2 times per day dx: e11.22   No current facility-administered medications for this visit. (Other)      REVIEW OF SYSTEMS:    ALLERGIES Allergies  Allergen Reactions  . Other Anaphylaxis and Rash    Bleach  . Penicillins Hives    Did it involve swelling of the face/tongue/throat, SOB, or low BP? No Did it involve sudden or severe rash/hives, skin peeling, or any reaction on the inside of your mouth or nose? Yes Did you need to seek medical attention at a hospital or doctor's office? Yes When did it last happen?Childhood allergy  If all above answers are "NO", may proceed with cephalosporin use.    . Sulfa Antibiotics Hives  . Aspirin Other (See Comments)     On aspirin 81 mg - Rectal bleeding in dec 2018  . Codeine     Headache and makes the patient feel "off"    PAST MEDICAL HISTORY Past Medical History:  Diagnosis Date  . Abnormal liver function     in the past.  . Anemia   . Arthritis    all over  . Bruises easily   . Cataract    right eye;immature  . CHF (congestive heart failure) ([place])   . Chronic back pain    stenosis  . Chronic cough   . Chronic kidney disease   . COPD (chronic obstructive pulmonary disease) ([place])   . COVID    aug/sept 2021  . Demand myocardial infarction [facility]) 2012   Demand Infarction in setting of Pancreatitis --> mild Troponin elevation, NON-OBSTRUCTIVE CAD  . Depression    takes Abilify daily as well as Zoloft  . Diastolic heart failure 5409   Grade 1 diastolic Dysfunction by Echo   . Diverticulosis   . DM (diabetes mellitus) ([place])    takes Victoza daily as well as Lantus and Humalog  . Empty sella ([place])    on MRI in 2009.  . Glaucoma   . Headache(784.0)    last migraine-4-76yr ago  . History of blood transfusion    no abnormal  reaction noted  . History of colon polyps    benign  . HTN (hypertension)    takes BKinder Morgan Energydaily  . Hyperlipidemia    takes Lipitor daily  . Joint swelling   . Nocturia   . Obesity hypoventilation syndrome ([place]   . Obstructive sleep apnea 02/2018   Notably improved split-night study with weight loss from 270 (2017) down to 250 pounds (2019).  . Pancreatitis    takes Pancrelipase daily  . Parkinson's disease ([place]    takes Sinemet daily  . Peripheral neuropathy   . Pneumonia 2012  . Seizures ([place]    takes Lamictal daily and Primidone nightly;last seizure  2wks ago  . Urinary urgency    With increased frequency  . Varicose veins of both lower extremities with pain    With edema.  Takes daily Lasix   Past Surgical History:  Procedure Laterality Date  . ABDOMINAL HYSTERECTOMY    . APPENDECTOMY    . BACK SURGERY    . Cardiac Event Monitor  September-October 2017   Sinus rhythm with occasional PACs and artifact. No arrhythmias besides one short run of tachycardia.  . CHOLECYSTECTOMY    . COLONOSCOPY N/A 08/15/2012   Procedure: COLONOSCOPY;  Surgeon: Arta Silence, MD;  Location: [facility];  Service: Endoscopy;  Laterality: N/A;  . COLONOSCOPY WITH PROPOFOL Left 06/17/2017   Procedure: COLONOSCOPY WITH PROPOFOL;  Surgeon: [name], MD;  Location: [facility];  Service: Gastroenterology;  Laterality: Left;  . CORONARY CALCIUM SCORE AND CTA  04/2018   Coronary calcium score 71.9.  Very large, hyperdynamic LAD wrapping apex giving rise to PDA.  Moderate LAD-diagonal and circumflex plaque -> CT FFR suggested positive findings in distal D1, D2 and distal circumflex.  Referred for cath. ==> FALSE POSITIVE  . ESOPHAGOGASTRODUODENOSCOPY    . eye cysts Bilateral   . LASIK    . LEFT HEART CATH AND CORONARY ANGIOGRAPHY N/A 05/16/2018   Procedure: LEFT HEART CATH AND CORONARY ANGIOGRAPHY;  Surgeon: Belva Crome, MD;  Location: [facility] INVASIVE CV LAB:  Angiographically  normal coronary arteries with LVEDP of 21 mmHg.  Large draping hyperdominant LAD that wraps the apex and provides distal half of the PDA.  Relatively small caliber distal Cx -->proxLPDA, non-dom RCA. No Cx or Diag lesions -- FALSE + CT FFR  . LEFT HEART CATH AND CORONARY ANGIOGRAPHY  2009/March 2012   2009: (Dr. Alvie Heidelberg) Nonobstructive CAD; 2012: Minimal CAD --> false positive stress test  . NM MYOVIEW LTD  01/2016; 01/2018   a)LOW RISK. No ischemia or infarction;; b) EF 55-60%. NO ST changes.  No ischemia or Infarction. NO significant RV enlargement.  LOW RISK.  Marland Kitchen Polysomnogram  02/2018   (Dr. Brett Fairy from neurology): Split study was not adequately done due to low AHI.  She slept reclined and had an AHI less than 10, prolonged hypoxemia and no hypercapnia. -->  She has home oxygen already prescribed, but did not meet criteria for nightly oxygen.  (Was noted that the patient did not cooperate well with study.  Titration was discussed.  Marland Kitchen PRESSURE SENSOR/CARDIOMEMS N/A 11/09/2019   Procedure: PRESSURE SENSOR/CARDIOMEMS;  Surgeon: Larey Dresser, MD;  Location: [facility] CV LAB;  Service: Cardiovascular;  Laterality: N/A;  . RECTAL POLYPECTOMY    . RIGHT/LEFT HEART CATH AND CORONARY ANGIOGRAPHY N/A 04/06/2019   Procedure: RIGHT/LEFT HEART CATH AND CORONARY ANGIOGRAPHY;  Surgeon: Leonie Man, MD;  Location: [facility] INVASIVE CV LAB; angiographically normal coronary arteries.  Moderately elevated AD LVEDP..  Mild pulmonary hypertension: PA P 56/14 mmHg-mean 31 mmHg.  RAP 10 mmHg.  RV P-EDP 54/4 mmHg - 12 mmHg.  PCWP 11-15 mmHg.  CO-CI: 7.35-3.73.  . TONSILLECTOMY    . TRANSTHORACIC ECHOCARDIOGRAM  01/2016; 05/2017   A) Normal LV chamber size with mod LVH pattern. EF 50-55%. Severe LA dilation. Mod RA dilation. PAP elevated at 38 mmHg;; B) Mild concentric LVH.  EF 55-60% & no RWMA.  Grade 2 DD.  Diastolic flattening of the ventricular septum == ? elevated PAP.  Mild aortic valve calcification/sclerosis.   Mod LA dilation.  Mild RV dilation & Mod RA dilation  . TRANSTHORACIC ECHOCARDIOGRAM  10/2017; 01/2018   a) EF 55-60%.  No RWMA,  Moderate LA dilation.  Mild LA dilation.  Peak PA pressures ~57 mmHg (moderate);;; b) EF 55-60%.  GRII DD.  Suggestion of RV volume overload with moderate RV dilation.  Severe LA and RA dilation.Marland Kitchen  PA pressure ~67%.  . TRANSTHORACIC ECHOCARDIOGRAM  01/2019   Normal LV size and function.  EF 50 to 55%.  Impaired relaxation.  RV appears to have moderately reduced function.  Severely enlarged.  Cannot fully assess RV pressures.  Hypermobile interatrial septum.  Right atrium severely dilated.  . TUBAL LIGATION    . vaginal cyst removed     several times    FAMILY HISTORY Family History  Problem Relation Age of Onset  . Allergies Father   . Heart disease Father        before 52  . Heart failure Father   . Hypertension Father   . Hyperlipidemia Father   . Heart disease Mother   . Diabetes Mother   . Hypertension Mother   . Hyperlipidemia Mother   . Hypertension Sister   . Heart disease Sister        before 21  . Hyperlipidemia Sister   . Diabetes Brother   . Hypertension Brother   . Diabetes Sister   . Hypertension Sister   . Diabetes Son   . Hypertension Son   . Cancer Other     SOCIAL HISTORY Social History   Tobacco Use  . Smoking status: Former Smoker    Packs/day: 0.50    Years: 25.00    Pack years: 12.50    Types: Cigarettes  . Smokeless tobacco: Never Used  . Tobacco comment: quit smoking 20+ytrs ago  Vaping Use  . Vaping Use: Never used  Substance Use Topics  . Alcohol use: No  . Drug use: No         OPHTHALMIC EXAM: Not recorded     IMAGING AND PROCEDURES  Imaging and Procedures for 10/26/2020           ASSESSMENT/PLAN:  No diagnosis found.  1.  2.  3.  Ophthalmic Meds Ordered this visit:  No orders of the defined types were placed in this encounter.      No follow-ups on file.  There are no Patient  Instructions on file for this visit.   Explained the diagnoses, plan, and follow up with the patient and they expressed understanding.  Patient expressed understanding of the importance of proper follow up care.   Gardiner Sleeper, M.D., Ph.D. Diseases & Surgery of the Retina and Vitreous [facility] _0 @     Abbreviations: M myopia (nearsighted); A astigmatism; H hyperopia (farsighted); P presbyopia; Mrx spectacle prescription;  CTL contact lenses; OD right eye; OS left eye; OU both eyes  XT exotropia; ET esotropia; PEK punctate epithelial keratitis; PEE punctate epithelial erosions; DES dry eye syndrome; MGD meibomian gland dysfunction; ATs artificial tears; PFAT's preservative free artificial tears; [place] nuclear sclerotic cataract; PSC posterior subcapsular cataract; ERM epi-retinal membrane; PVD posterior vitreous detachment; RD retinal detachment; DM diabetes mellitus; DR diabetic retinopathy; NPDR non-proliferative diabetic retinopathy; PDR proliferative diabetic retinopathy; CSME clinically significant macular edema; DME diabetic macular edema; dbh dot blot hemorrhages; CWS cotton wool spot; POAG primary open angle glaucoma; C/D cup-to-disc ratio; HVF humphrey visual field; GVF goldmann visual field; OCT optical coherence tomography; IOP intraocular pressure; BRVO Branch retinal vein occlusion; CRVO central retinal vein occlusion; CRAO central retinal artery occlusion;  BRAO branch retinal artery occlusion; RT retinal tear; SB scleral buckle; PPV pars plana vitrectomy; VH Vitreous hemorrhage; PRP panretinal laser photocoagulation; IVK intravitreal kenalog; VMT vitreomacular traction; MH Macular hole;  NVD neovascularization of the disc; NVE neovascularization elsewhere; AREDS age related eye disease study; ARMD age related macular degeneration; POAG primary open angle glaucoma; EBMD epithelial/anterior basement membrane dystrophy; ACIOL anterior chamber intraocular lens;  IOL intraocular lens; PCIOL posterior chamber intraocular lens; Phaco/IOL phacoemulsification with intraocular lens placement; [place] photorefractive keratectomy; LASIK laser assisted in situ keratomileusis; HTN hypertension; DM diabetes mellitus; COPD chronic obstructive pulmonary disease

## 2020-10-25 NOTE — Telephone Encounter (Signed)
Pt c/o: seizure Missed medications?  No. Sleep deprived?  Yes.  doesn't sleep well has a CPAP Alcohol intake?  No.  Increase in stress? Yes  Back to their usual baseline self?  Yes.  . If no, advise go to ER Current medications prescribed by Dr. Delice Lesch:   Lamotrigine 150mg  2 times daily  keppra 500mg  2 TABLETS AT BEDTIME In the bed sitting on the side fell off was in the floor EMS called one the 4/27. On 4/29 in the kitchen she felt off unsure if had a seizure cant remember what happened felt dry?

## 2020-10-25 NOTE — Telephone Encounter (Signed)
New message    Sister [name] calling to reported her sister had seizure on yesterday and on 4.29.22.   Please advise.

## 2020-10-25 NOTE — Progress Notes (Addendum)
[facility] Note  10/26/2020     CHIEF COMPLAINT Patient presents for Retina Evaluation   HISTORY OF PRESENT ILLNESS: [name] is a 75 y.o. female who presents to the clinic today for:   HPI    Retina Evaluation    In right eye.  I, the attending physician,  performed the HPI with the patient and updated documentation appropriately.          Comments    Patient here for Retina Evaluation. Referred by Dr Katy Fitch. Patient states vision sometimes has to move glasses around to see. Sometimes thinks sees something at the side. When looks nothing there. Has trouble with depth perception. No eye pain. Has pain above brow temporal ares. Dr Katy Fitch saw a hole in OD.        Last edited by Bernarda Caffey, MD on 10/26/2020  1:10 PM. (History)      Referring physician: Warden Fillers, MD [address] [place],  [place] [address]  HISTORICAL INFORMATION:   Selected notes from the MEDICAL RECORD NUMBER Referred by Dr. Truman Hayward:  Ocular Hx- PMH-    CURRENT MEDICATIONS: Current Outpatient Medications (Ophthalmic Drugs)  Medication Sig  . prednisoLONE acetate (PRED FORTE) 1 % ophthalmic suspension Place 1 drop into the right eye 4 (four) times daily for 7 days.   No current facility-administered medications for this visit. (Ophthalmic Drugs)   Current Outpatient Medications (Other)  Medication Sig  . acetaminophen (TYLENOL) 500 MG tablet Take 500-1,000 mg by mouth every 4 (four) hours as needed for moderate pain.   Marland Kitchen albuterol (VENTOLIN HFA) 108 (90 Base) MCG/ACT inhaler Inhale 1-2 puffs into the lungs every 6 (six) hours as needed for wheezing or shortness of breath.  . Alcohol Swabs (B-D SINGLE USE SWABS REGULAR) PADS USE TWICE DAILY AS DIRECTED  . Ascorbic Acid (VITAMIN C WITH ROSE HIPS) 1000 MG tablet Take 1,000 mg by mouth daily.  Marland Kitchen atorvastatin (LIPITOR) 10 MG tablet TAKE 1 TABLET EVERY EVENING.  . B-D ULTRAFINE III SHORT PEN 31G X 8 MM MISC  USE DAILY WITH VICTOZA AND/OR NOVOLOG.  . Blood Glucose Calibration (TRUE METRIX LEVEL 1) Low SOLN Use as directed  Dx:e11.22  . Blood Glucose Monitoring Suppl (TRUE METRIX METER) w/Device KIT Use as directed to check blood sugars 2 times per day dx: e11.22  . carvedilol (COREG) 25 MG tablet TAKE 1 TABLET TWICE DAILY WITH A MEAL  . Cholecalciferol (VITAMIN D-3) 1000 units CAPS Take 5,000 Units by mouth daily.   Marland Kitchen dexlansoprazole (DEXILANT) 60 MG capsule Take 1 capsule (60 mg total) by mouth daily.  . diclofenac sodium (VOLTAREN) 1 % GEL Apply 2 g topically 4 (four) times daily as needed (pain).  . ferrous sulfate 324 MG TBEC Take 324 mg by mouth 2 (two) times daily.  . furosemide (LASIX) 80 MG tablet Take 1 tablet (80 mg total) by mouth daily. 52m daily Monday-Friday, 462mon Saturday and Sunday  . glucose blood (TRUE METRIX BLOOD GLUCOSE TEST) test strip Use as directed to check blood sugars 2 times per day dx: e11.22  . hydrALAZINE (APRESOLINE) 100 MG tablet Take 1 tablet (100 mg total) by mouth 3 (three) times daily.  . hydrocortisone cream 1 % Apply 1 application topically daily as needed for itching.   . insulin glargine, 2 Unit Dial, (TOUJEO MAX SOLOSTAR) 300 UNIT/ML Solostar Pen Inject 25 Units into the skin at bedtime.  . insulin lispro (HUMALOG KWIKPEN)  100 UNIT/ML KwikPen Inject 0.02-0.04 mLs (2-4 Units total) into the skin 3 (three) times daily as needed (high blood sugar over 150).  . Isopropyl Alcohol (ALCOHOL WIPES) 70 % MISC Apply 1 each topically 2 (two) times daily.  . isosorbide mononitrate (IMDUR) 30 MG 24 hr tablet Take 1 tablet (30 mg total) by mouth daily.  Marland Kitchen lamoTRIgine (LAMICTAL) 150 MG tablet Take 1 tablet (150 mg total) by mouth 2 (two) times daily.  Marland Kitchen levETIRAcetam (KEPPRA XR) 500 MG 24 hr tablet Take 3 tablets every night  . Menthol, Topical Analgesic, (STOPAIN ROLL-ON EX) Apply 1 application topically daily as needed (pain).  Marland Kitchen neomycin-bacitracin-polymyxin  (NEOSPORIN) ointment Apply 1 application topically as needed for wound care.   . ondansetron (ZOFRAN) 4 MG tablet   . OZEMPIC, 1 MG/DOSE, 4 MG/3ML SOPN INJECT $RemoveBef'1MG'HAyCVTnAAw$  INTO THE SKIN ONCE A WEEK  . Pancrelipase, Lip-Prot-Amyl, (ZENPEP PO) Take by mouth.  . Pancrelipase, Lip-Prot-Amyl, 25000-79000 units CPEP Take 1-2 capsules by mouth See admin instructions. Take 2 capsules in the morning, 1 capsule with lunch, and 2 capsules in the evening.  . potassium chloride SA (KLOR-CON) 20 MEQ tablet TAKE 2 TABLETS EVERY DAY BUT WHEN TAKING METOLAZONE TAKE 3 TABLETS DAILY AS DIRECTED  . sennosides-docusate sodium (SENOKOT-S) 8.6-50 MG tablet Take 1 tablet by mouth daily.  . sertraline (ZOLOFT) 50 MG tablet TAKE 1 TABLET EVERY DAY  . tolnaftate (TINACTIN) 1 % cream Apply 1 application topically daily as needed (foot fungus).   . TRUEplus Lancets 33G MISC Use as directed to check blood sugars 2 times per day dx: e11.22   No current facility-administered medications for this visit. (Other)      REVIEW OF SYSTEMS: ROS    Positive for: Eyes   Last edited by Theodore Demark, COA on 10/26/2020  9:56 AM. (History)       ALLERGIES Allergies  Allergen Reactions  . Other Anaphylaxis and Rash    Bleach  . Penicillins Hives    Did it involve swelling of the face/tongue/throat, SOB, or low BP? No Did it involve sudden or severe rash/hives, skin peeling, or any reaction on the inside of your mouth or nose? Yes Did you need to seek medical attention at a hospital or doctor's office? Yes When did it last happen?Childhood allergy  If all above answers are "NO", may proceed with cephalosporin use.    . Sulfa Antibiotics Hives  . Aspirin Other (See Comments)     On aspirin 81 mg - Rectal bleeding in dec 2018  . Codeine     Headache and makes the patient feel "off"    PAST MEDICAL HISTORY Past Medical History:  Diagnosis Date  . Abnormal liver function     in the past.  . Anemia   . Arthritis     all over  . Bruises easily   . Cataract    right eye;immature  . CHF (congestive heart failure) ([place])   . Chronic back pain    stenosis  . Chronic cough   . Chronic kidney disease   . COPD (chronic obstructive pulmonary disease) ([place])   . COVID    aug/sept 2021  . Demand myocardial infarction [facility]) 2012   Demand Infarction in setting of Pancreatitis --> mild Troponin elevation, NON-OBSTRUCTIVE CAD  . Depression    takes Abilify daily as well as Zoloft  . Diastolic heart failure 4235   Grade 1 diastolic Dysfunction by Echo   . Diverticulosis   . DM (  diabetes mellitus) ([place])    takes Victoza daily as well as Lantus and Humalog  . Empty sella ([place])    on MRI in 2009.  . Glaucoma   . Headache(784.0)    last migraine-4-35yr ago  . History of blood transfusion    no abnormal reaction noted  . History of colon polyps    benign  . HTN (hypertension)    takes BKinder Morgan Energydaily  . Hyperlipidemia    takes Lipitor daily  . Joint swelling   . Nocturia   . Ob[place] hypoventilation syndrome ([place]   . Obstructive sleep apnea 02/2018   Notably improved split-night study with weight loss from 270 (2017) down to 250 pounds (2019).  . Pancreatitis    takes Pancrelipase daily  . Parkinson's disease ([place]    takes Sinemet daily  . Peripheral neuropathy   . Pneumonia 2012  . Seizures ([place]    takes Lamictal daily and Primidone nightly;last seizure 2wks ago  . Urinary urgency    With increased frequency  . Varicose veins of both lower extremities with pain    With edema.  Takes daily Lasix   Past Surgical History:  Procedure Laterality Date  . ABDOMINAL HYSTERECTOMY    . APPENDECTOMY    . BACK SURGERY    . Cardiac Event Monitor  September-October 2017   Sinus rhythm with occasional PACs and artifact. No arrhythmias besides one short run of tachycardia.  . CHOLECYSTECTOMY    . COLONOSCOPY N/A 08/15/2012   Procedure: COLONOSCOPY;  Surgeon: WArta Silence MD;   Location: [facility];  Service: Endoscopy;  Laterality: N/A;  . COLONOSCOPY WITH PROPOFOL Left 06/17/2017   Procedure: COLONOSCOPY WITH PROPOFOL;  Surgeon: KRonnette Juniper MD;  [facility]  Service: Gastroenterology;  Laterality: Left;  . CORONARY CALCIUM SCORE AND CTA  04/2018   Coronary calcium score 71.9.  Very large, hyperdynamic LAD wrapping apex giving rise to PDA.  Moderate LAD-diagonal and circumflex plaque -> CT FFR suggested positive findings in distal D1, D2 and distal circumflex.  Referred for cath. ==> FALSE POSITIVE  . ESOPHAGOGASTRODUODENOSCOPY    . eye cysts Bilateral   . LASIK    . LEFT HEART CATH AND CORONARY ANGIOGRAPHY N/A 05/16/2018   Procedure: LEFT HEART CATH AND CORONARY ANGIOGRAPHY;  Surgeon: SBelva Crome MD;  Location: [facility] INVASIVE CV LAB:  Angiographically normal coronary arteries with LVEDP of 21 mmHg.  Large draping hyperdominant LAD that wraps the apex and provides distal half of the PDA.  Relatively small caliber distal Cx -->proxLPDA, non-dom RCA. No Cx or Diag lesions -- FALSE + CT FFR  . LEFT HEART CATH AND CORONARY ANGIOGRAPHY  2009/March 2012   2009: (Dr. KAlvie [place] Nonobstructive CAD; 2012: Minimal CAD --> false positive stress test  . NM MYOVIEW LTD  01/2016; 01/2018   a)LOW RISK. No ischemia or infarction;; b) EF 55-60%. NO ST changes.  No ischemia or Infarction. NO significant RV enlargement.  LOW RISK.  .Marland KitchenPolysomnogram  02/2018   (Dr. DBrett Fairyfrom neurology): Split study was not adequately done due to low AHI.  She slept reclined and had an AHI less than 10, prolonged hypoxemia and no hypercapnia. -->  She has home oxygen already prescribed, but did not meet criteria for nightly oxygen.  (Was noted that the patient did not cooperate well with study.  Titration was discussed.  .Marland KitchenPRESSURE SENSOR/CARDIOMEMS N/A 11/09/2019   Procedure: PRESSURE SENSOR/CARDIOMEMS;  Surgeon: MLarey Dresser MD;  [facility];  Service: Cardiovascular;   Laterality: N/A;  . RECTAL POLYPECTOMY    . RIGHT/LEFT HEART CATH AND CORONARY ANGIOGRAPHY N/A 04/06/2019   Procedure: RIGHT/LEFT HEART CATH AND CORONARY ANGIOGRAPHY;  Surgeon: Leonie Man, MD;  [facility] IN[facility]; angiographically normal coronary arteries.  Moderately elevated AD LVEDP..  Mild pulmonary hypertension: PA P 56/14 mmHg-mean 31 mmHg.  RAP 10 mmHg.  RV P-EDP 54/4 mmHg - 12 mmHg.  PCWP 11-15 mmHg.  CO-CI: 7.35-3.73.  . TONSILLECTOMY    . TRANSTHORACIC ECHOCARDIOGRAM  01/2016; 05/2017   A) Normal LV chamber size with mod LVH pattern. EF 50-55%. Severe LA dilation. Mod RA dilation. PAP elevated at 38 mmHg;; B) Mild concentric LVH.  EF 55-60% & no RWMA.  Grade 2 DD.  Diastolic flattening of the ventricular septum == ? elevated PAP.  Mild aortic valve calcification/sclerosis.  Mod LA dilation.  Mild RV dilation & Mod RA dilation  . TRANSTHORACIC ECHOCARDIOGRAM  10/2017; 01/2018   a) EF 55-60%.  No RWMA,  Moderate LA dilation.  Mild LA dilation.  Peak PA pressures ~57 mmHg (moderate);;; b) EF 55-60%.  GRII DD.  Suggestion of RV volume overload with moderate RV dilation.  Severe LA and RA dilation.Marland Kitchen  PA pressure ~67%.  . TRANSTHORACIC ECHOCARDIOGRAM  01/2019   Normal LV size and function.  EF 50 to 55%.  Impaired relaxation.  RV appears to have moderately reduced function.  Severely enlarged.  Cannot fully assess RV pressures.  Hypermobile interatrial septum.  Right atrium severely dilated.  . TUBAL LIGATION    . vaginal cyst removed     several times    FAMILY HISTORY Family History  Problem Relation Age of Onset  . Allergies Father   . Heart disease Father        before 54  . Heart failure Father   . Hypertension Father   . Hyperlipidemia Father   . Heart disease Mother   . Diabetes Mother   . Hypertension Mother   . Hyperlipidemia Mother   . Hypertension Sister   . Heart disease Sister        before 60  . Hyperlipidemia Sister   . Diabetes Brother   .  Hypertension Brother   . Diabetes Sister   . Hypertension Sister   . Diabetes Son   . Hypertension Son   . Cancer Other     SOCIAL HISTORY Social History   Tobacco Use  . Smoking status: Former Smoker    Packs/day: 0.50    Years: 25.00    Pack years: 12.50    Types: Cigarettes  . Smokeless tobacco: Never Used  . Tobacco comment: quit smoking 20+ytrs ago  Vaping Use  . Vaping Use: Never used  Substance Use Topics  . Alcohol use: No  . Drug use: No         OPHTHALMIC EXAM:  Base Eye Exam    Visual Acuity (Snellen - Linear)      Right Left   Dist cc 20/30 -2 20/40 -2   Dist ph cc NI 20/30 +2   Correction: Glasses       Tonometry (Tonopen, 9:44 AM)      Right Left   Pressure 15 19       Pupils      Dark Light Shape React APD   Right 3 2 Round Brisk None   Left 3 2 Round Brisk None       Visual Fields (Counting fingers)  Left Right    Full Full       Extraocular Movement      Right Left    Full, Ortho Full, Ortho       Neuro/Psych    Oriented x3: Yes   Mood/Affect: Normal       Dilation    Both eyes: 1.0% Mydriacyl, 2.5% Phenylephrine @ 9:44 AM        Slit Lamp and Fundus Exam    Slit Lamp Exam      Right Left   Lids/Lashes Dermatochalasis - upper lid Dermatochalasis - upper lid   Conjunctiva/Sclera mild melanosis mild melanosis   Cornea Mild arcus, Trace endo pigment Mild arcus, trace PEE   Anterior Chamber deep, clear, narrow angle, PI at 0900 deep, clear, narrow angle, PI at 0900   Iris Round and dilated, No NVI Round and dilated, No NVI   Lens PC IOL in good position PC IOL in good position   Vitreous Vitreous syneresis, Posterior vitreous detachment, Weiss ring Vitreous syneresis, Posterior vitreous detachment, Weiss ring       Fundus Exam      Right Left   Disc Pink and Sharp, focal PPA/PPP mild Pallor, Sharp rim   C/D Ratio 0.6 0.6   Macula Flat, Blunted foveal reflex, RPE mottling and clumping, No heme or edema Flat, Blunted  foveal reflex, RPE mottling and clumping, No heme or edema   Vessels attenuated, Tortuous attenuated, Tortuous   Periphery Attached, pigmented CR scar/lattice at 0430, focal pigmented lattice at 0730 with atrophic hole and +focal SRF, shallow schisis anterior to lattice Attached, focal pigmented lattice at 0430 and 0500 (atrophic hole)        Refraction    Wearing Rx      Sphere Cylinder Axis Add   Right +0.00 +1.25 053 +2.25   Left -0.75 +1.00 118 +2.25       Manifest Refraction (Auto)      Sphere Cylinder Axis Dist VA   Right +0.25 +1.00 043 20/30   Left +0.00 +0.50 123 20/25          IMAGING AND PROCEDURES  Imaging and Procedures for 10/26/2020  OCT, Retina - OU - Both Eyes       Right Eye Quality was good. Central Foveal Thickness: 240. Progression has no prior data. Findings include abnormal foveal contour, no IRF, no SRF (Foveal notch, peripheral schisis and focal SRF with retinal break caught on widefield).   Left Eye Quality was good. Central Foveal Thickness: 246. Progression has no prior data. Findings include normal foveal contour, no IRF, no SRF (Rare drusen).   Notes *Images captured and stored on drive  Diagnosis / Impression:  OD: Foveal notch, peripheral schisis and focal SRF with retinal break caught on widefield -- IT quadrant OS: NFP, no IRF/SRF  Clinical management:  See below  Abbreviations: NFP - Normal foveal profile. CME - cystoid macular edema. PED - pigment epithelial detachment. IRF - intraretinal fluid. SRF - subretinal fluid. EZ - ellipsoid zone. ERM - epiretinal membrane. ORA - outer retinal atrophy. ORT - outer retinal tubulation. SRHM - subretinal hyper-reflective material. IRHM - intraretinal hyper-reflective material        Repair Retinal Detach, Photocoag - OD - Right Eye       LASER PROCEDURE NOTE  Procedure:  Barrier laser retinopexy using slit lamp laser, RIGHT eye   Diagnosis:   Lattice degeneration w/ atrophic holes and  +SRF/focal RD, RIGHT eye  Pigmented lattice at 430 and 0730 -- 730 w/ atrophic hole and +SRF/focal RD  Surgeon: Bernarda Caffey, MD, PhD  Anesthesia: Topical  Informed consent obtained, operative eye marked, and time out performed prior to initiation of laser.   Laser settings:  Lumenis [id number] laser, slit lamp Lens: Mainster PRP 165 Power: 260 mW Spot size: 200 microns Duration: 30 msec  # spots: 627  Placement of laser: Using a Mainster PRP 165 contact lens at the slit lamp, laser was placed in three confluent rows around patches of pigmented lattice at 0430 and 0730 oclock anterior to equator with additional rows anteriorly + posterior to peripheral schisis in IT quadrant.  Complications: None.  Patient tolerated the procedure well and received written and verbal post-procedure care information/education.               ASSESSMENT/PLAN:    ICD-10-CM   1. Bilateral retinal lattice degeneration  H35.413 Repair Retinal Detach, Photocoag - OD - Right Eye  2. Retinal hole of both eyes  H33.323 Repair Retinal Detach, Photocoag - OD - Right Eye  3. Retinal detachment, right  H33.21 Repair Retinal Detach, Photocoag - OD - Right Eye  4. Retinal edema  H35.81 OCT, Retina - OU - Both Eyes  5. Right retinoschisis  H33.101   6. Diabetes mellitus type 2 without retinopathy ([place])  E11.9   7. Essential hypertension  I10   8. Hypertensive retinopathy of both eyes  H35.033   9. Pseudophakia, both eyes  Z96.1     1-4. Lattice degeneration w/ atrophic holes, both eyes - OD: focal pigmented lattice at 0430 and 0730 with atrophic hole, +focal SRF - OS: focal pigmented lattice at 0430 and 0500 (atrophic hole) - discussed findings, prognosis, and treatment options including observation - recommend laser retinopexy OU, OD first today, 05.04.22 - pt wishes to proceed with laser - RBA of procedure discussed, questions answered - informed consent obtained and signed - see  procedure note - start PF QID OD x7 days - f/u Friday, sooner prn -- laser retinopexy OS  5. Retinoschisis OD  - shallow peripheral retinoschisis in IT quad, anterior to 0730 lattice w/ break  - discussed findings, prognosis and treatment options  - recommend laser retinopexy OD along with pexy for lattice above  - laser placed just posterior to schisis border  - see procedure note  6. Diabetes mellitus, type 2 without retinopathy - The incidence, risk factors for progression, natural history and treatment options for diabetic retinopathy  were discussed with patient.   - The need for close monitoring of blood glucose, blood pressure, and serum lipids, avoiding cigarette or any type of tobacco, and the need for long term follow up was also discussed with patient. - f/u in 1 year, sooner prn  7,8. Hypertensive retinopathy OU - discussed importance of tight BP control - monitor  9. Pseudophakia OU  - s/p CE/IOL OU  - IOL in good position, doing well - monitor    Ophthalmic Meds Ordered this visit:  Meds ordered this encounter  Medications  . prednisoLONE acetate (PRED FORTE) 1 % ophthalmic suspension    Sig: Place 1 drop into the right eye 4 (four) times daily for 7 days.    Dispense:  10 mL    Refill:  0       Return in about 2 days (around 10/28/2020) for f/u lattice degeneration OU, DFE, OCT.  There are no Patient Instructions on file for this visit.   Explained  the diagnoses, plan, and follow up with the patient and they expressed understanding.  Patient expressed understanding of the importance of proper follow up care.   This document serves as a record of services personally performed by Gardiner Sleeper, MD, PhD. It was created on their behalf by Roselee Nova, COMT. The creation of this record is the provider's dictation and/or activities during the visit.  Electronically signed by: Roselee Nova, COMT 10/26/20 11:04 PM  This document serves as a record of services  personally performed by Gardiner Sleeper, MD, PhD. It was created on their behalf by San Jetty. Owens Shark, OA an ophthalmic technician. The creation of this record is the provider's dictation and/or activities during the visit.    Electronically signed by: San Jetty. Owens Shark, [place] 05.04.2022 11:04 PM  Gardiner Sleeper, M.D., Ph.D. Diseases & Surgery of the Retina and Vitreous [facility]  I have reviewed the above documentation for accuracy and completeness, and I agree with the above. Gardiner Sleeper, M.D., Ph.D. 10/26/20 11:04 PM   Abbreviations: M myopia (nearsighted); A astigmatism; H hyperopia (farsighted); P presbyopia; Mrx spectacle prescription;  CTL contact lenses; OD right eye; OS left eye; OU both eyes  XT exotropia; ET esotropia; PEK punctate epithelial keratitis; PEE punctate epithelial erosions; DES dry eye syndrome; MGD meibomian gland dysfunction; ATs artificial tears; PFAT's preservative free artificial tears; [place] nuclear sclerotic cataract; PSC posterior subcapsular cataract; ERM epi-retinal membrane; PVD posterior vitreous detachment; RD retinal detachment; DM diabetes mellitus; DR diabetic retinopathy; NPDR non-proliferative diabetic retinopathy; PDR proliferative diabetic retinopathy; CSME clinically significant macular edema; DME diabetic macular edema; dbh dot blot hemorrhages; CWS cotton wool spot; POAG primary open angle glaucoma; C/D cup-to-disc ratio; HVF humphrey visual field; GVF goldmann visual field; OCT optical coherence tomography; IOP intraocular pressure; BRVO Branch retinal vein occlusion; CRVO central retinal vein occlusion; CRAO central retinal artery occlusion; BRAO branch retinal artery occlusion; RT retinal tear; SB scleral buckle; PPV pars plana vitrectomy; VH Vitreous hemorrhage; PRP panretinal laser photocoagulation; IVK intravitreal kenalog; VMT vitreomacular traction; MH Macular hole;  NVD neovascularization of the disc; NVE neovascularization  elsewhere; AREDS age related eye disease study; ARMD age related macular degeneration; POAG primary open angle glaucoma; EBMD epithelial/anterior basement membrane dystrophy; ACIOL anterior chamber intraocular lens; IOL intraocular lens; PCIOL posterior chamber intraocular lens; Phaco/IOL phacoemulsification with intraocular lens placement; [place] photorefractive keratectomy; LASIK laser assisted in situ keratomileusis; HTN hypertension; DM diabetes mellitus; COPD chronic obstructive pulmonary disease

## 2020-10-26 ENCOUNTER — Encounter (INDEPENDENT_AMBULATORY_CARE_PROVIDER_SITE_OTHER): Payer: Medicare HMO | Admitting: Ophthalmology

## 2020-10-26 ENCOUNTER — Other Ambulatory Visit: Payer: Self-pay

## 2020-10-26 ENCOUNTER — Ambulatory Visit (INDEPENDENT_AMBULATORY_CARE_PROVIDER_SITE_OTHER): Payer: Medicare HMO | Admitting: Ophthalmology

## 2020-10-26 ENCOUNTER — Encounter (INDEPENDENT_AMBULATORY_CARE_PROVIDER_SITE_OTHER): Payer: Self-pay | Admitting: Ophthalmology

## 2020-10-26 DIAGNOSIS — H35033 Hypertensive retinopathy, bilateral: Secondary | ICD-10-CM | POA: Diagnosis not present

## 2020-10-26 DIAGNOSIS — H3581 Retinal edema: Secondary | ICD-10-CM | POA: Diagnosis not present

## 2020-10-26 DIAGNOSIS — H3321 Serous retinal detachment, right eye: Secondary | ICD-10-CM

## 2020-10-26 DIAGNOSIS — H33323 Round hole, bilateral: Secondary | ICD-10-CM | POA: Diagnosis not present

## 2020-10-26 DIAGNOSIS — E119 Type 2 diabetes mellitus without complications: Secondary | ICD-10-CM | POA: Diagnosis not present

## 2020-10-26 DIAGNOSIS — I10 Essential (primary) hypertension: Secondary | ICD-10-CM | POA: Diagnosis not present

## 2020-10-26 DIAGNOSIS — H35413 Lattice degeneration of retina, bilateral: Secondary | ICD-10-CM | POA: Diagnosis not present

## 2020-10-26 DIAGNOSIS — Z961 Presence of intraocular lens: Secondary | ICD-10-CM | POA: Diagnosis not present

## 2020-10-26 DIAGNOSIS — H33101 Unspecified retinoschisis, right eye: Secondary | ICD-10-CM | POA: Diagnosis not present

## 2020-10-26 MED ORDER — LEVETIRACETAM ER 500 MG PO TB24: ORAL_TABLET | ORAL | 3 refills | Status: DC

## 2020-10-26 MED ORDER — PREDNISOLONE ACETATE 1 % OP SUSP: 1.0000 [drp] | Freq: Four times a day (QID) | OPHTHALMIC | 0 refills | Status: AC

## 2020-10-26 NOTE — Telephone Encounter (Signed)
Pt called no answer yesterday she asked for me to leave her voice mail. Voice mail left informing her increase Keppra to 3 tablets at bedtime. new Rx sent to pharmacy any questions please call the office back

## 2020-10-26 NOTE — Telephone Encounter (Signed)
Pls have her increase Keppra to 3 tablets at bedtime. I will send in a new Rx, thanks

## 2020-10-28 ENCOUNTER — Ambulatory Visit (INDEPENDENT_AMBULATORY_CARE_PROVIDER_SITE_OTHER): Payer: Medicare HMO | Admitting: Ophthalmology

## 2020-10-28 ENCOUNTER — Other Ambulatory Visit: Payer: Self-pay

## 2020-10-28 ENCOUNTER — Encounter (INDEPENDENT_AMBULATORY_CARE_PROVIDER_SITE_OTHER): Payer: Self-pay | Admitting: Ophthalmology

## 2020-10-28 DIAGNOSIS — H35033 Hypertensive retinopathy, bilateral: Secondary | ICD-10-CM | POA: Diagnosis not present

## 2020-10-28 DIAGNOSIS — H35413 Lattice degeneration of retina, bilateral: Secondary | ICD-10-CM

## 2020-10-28 DIAGNOSIS — H3321 Serous retinal detachment, right eye: Secondary | ICD-10-CM | POA: Diagnosis not present

## 2020-10-28 DIAGNOSIS — H3581 Retinal edema: Secondary | ICD-10-CM | POA: Diagnosis not present

## 2020-10-28 DIAGNOSIS — I10 Essential (primary) hypertension: Secondary | ICD-10-CM

## 2020-10-28 DIAGNOSIS — Z961 Presence of intraocular lens: Secondary | ICD-10-CM

## 2020-10-28 DIAGNOSIS — E119 Type 2 diabetes mellitus without complications: Secondary | ICD-10-CM | POA: Diagnosis not present

## 2020-10-28 DIAGNOSIS — H33323 Round hole, bilateral: Secondary | ICD-10-CM

## 2020-10-28 DIAGNOSIS — H33101 Unspecified retinoschisis, right eye: Secondary | ICD-10-CM

## 2020-10-28 MED ORDER — OZEMPIC (1 MG/DOSE) 4 MG/3ML ~~LOC~~ SOPN: PEN_INJECTOR | SUBCUTANEOUS | 2 refills | Status: DC

## 2020-10-28 MED ORDER — DEXLANSOPRAZOLE 60 MG PO CPDR: 60.0000 mg | DELAYED_RELEASE_CAPSULE | Freq: Every day | ORAL | 1 refills | Status: DC

## 2020-10-28 NOTE — Progress Notes (Signed)
[facility] Note  10/28/2020     CHIEF COMPLAINT Patient presents for Retina Follow Up   HISTORY OF PRESENT ILLNESS: [name] is a 75 y.o. female who presents to the clinic today for:   HPI    Retina Follow Up    Patient presents with  Other.  In left eye.  This started 2 days ago.  Since onset it is stable.  I, the attending physician,  performed the HPI with the patient and updated documentation appropriately.          Comments    Pt here for retinal follow up lattice degen OU, here for laser OS. Pt does not report any changes or ocular pain today. Pt is needing some clarification on gtts she was given.          Last edited by Bernarda Caffey, MD on 10/28/2020 12:24 PM. (History)      Referring physician: [name], MD [address] [place],   [address]  HISTORICAL INFORMATION:   Selected notes from the MEDICAL RECORD NUMBER Referred by Dr. Truman Hayward:  Ocular Hx- PMH-    CURRENT MEDICATIONS: Current Outpatient Medications (Ophthalmic Drugs)  Medication Sig  . prednisoLONE acetate (PRED FORTE) 1 % ophthalmic suspension Place 1 drop into the right eye 4 (four) times daily for 7 days.   No current facility-administered medications for this visit. (Ophthalmic Drugs)   Current Outpatient Medications (Other)  Medication Sig  . acetaminophen (TYLENOL) 500 MG tablet Take 500-1,000 mg by mouth every 4 (four) hours as needed for moderate pain.   Marland Kitchen albuterol (VENTOLIN HFA) 108 (90 Base) MCG/ACT inhaler Inhale 1-2 puffs into the lungs every 6 (six) hours as needed for wheezing or shortness of breath.  . Alcohol Swabs (B-D SINGLE USE SWABS REGULAR) PADS USE TWICE DAILY AS DIRECTED  . Ascorbic Acid (VITAMIN C WITH ROSE HIPS) 1000 MG tablet Take 1,000 mg by mouth daily.  Marland Kitchen atorvastatin (LIPITOR) 10 MG tablet TAKE 1 TABLET EVERY EVENING.  . B-D ULTRAFINE III SHORT PEN 31G X 8 MM MISC USE DAILY WITH VICTOZA AND/OR NOVOLOG.  . Blood  Glucose Calibration (TRUE METRIX LEVEL 1) Low SOLN Use as directed  Dx:e11.22  . Blood Glucose Monitoring Suppl (TRUE METRIX METER) w/Device KIT Use as directed to check blood sugars 2 times per day dx: e11.22  . carvedilol (COREG) 25 MG tablet TAKE 1 TABLET TWICE DAILY WITH A MEAL  . Cholecalciferol (VITAMIN D-3) 1000 units CAPS Take 5,000 Units by mouth daily.   Marland Kitchen dexlansoprazole (DEXILANT) 60 MG capsule Take 1 capsule (60 mg total) by mouth daily.  . diclofenac sodium (VOLTAREN) 1 % GEL Apply 2 g topically 4 (four) times daily as needed (pain).  . ferrous sulfate 324 MG TBEC Take 324 mg by mouth 2 (two) times daily.  . furosemide (LASIX) 80 MG tablet Take 1 tablet (80 mg total) by mouth daily. 63m daily Monday-Friday, 417mon Saturday and Sunday  . glucose blood (TRUE METRIX BLOOD GLUCOSE TEST) test strip Use as directed to check blood sugars 2 times per day dx: e11.22  . hydrALAZINE (APRESOLINE) 100 MG tablet Take 1 tablet (100 mg total) by mouth 3 (three) times daily.  . hydrocortisone cream 1 % Apply 1 application topically daily as needed for itching.   . insulin glargine, 2 Unit Dial, (TOUJEO MAX SOLOSTAR) 300 UNIT/ML Solostar Pen Inject 25 Units into the skin at bedtime.  . insulin lispro (HUMALOG  KWIKPEN) 100 UNIT/ML KwikPen Inject 0.02-0.04 mLs (2-4 Units total) into the skin 3 (three) times daily as needed (high blood sugar over 150).  . Isopropyl Alcohol (ALCOHOL WIPES) 70 % MISC Apply 1 each topically 2 (two) times daily.  . isosorbide mononitrate (IMDUR) 30 MG 24 hr tablet Take 1 tablet (30 mg total) by mouth daily.  Marland Kitchen lamoTRIgine (LAMICTAL) 150 MG tablet Take 1 tablet (150 mg total) by mouth 2 (two) times daily.  Marland Kitchen levETIRAcetam (KEPPRA XR) 500 MG 24 hr tablet Take 3 tablets every night  . Menthol, Topical Analgesic, (STOPAIN ROLL-ON EX) Apply 1 application topically daily as needed (pain).  Marland Kitchen neomycin-bacitracin-polymyxin (NEOSPORIN) ointment Apply 1 application topically as  needed for wound care.   . ondansetron (ZOFRAN) 4 MG tablet   . OZEMPIC, 1 MG/DOSE, 4 MG/3ML SOPN INJECT 1MG INTO THE SKIN ONCE A WEEK  . Pancrelipase, Lip-Prot-Amyl, (ZENPEP PO) Take by mouth.  . Pancrelipase, Lip-Prot-Amyl, 25000-79000 units CPEP Take 1-2 capsules by mouth See admin instructions. Take 2 capsules in the morning, 1 capsule with lunch, and 2 capsules in the evening.  . potassium chloride SA (KLOR-CON) 20 MEQ tablet TAKE 2 TABLETS EVERY DAY BUT WHEN TAKING METOLAZONE TAKE 3 TABLETS DAILY AS DIRECTED  . sennosides-docusate sodium (SENOKOT-S) 8.6-50 MG tablet Take 1 tablet by mouth daily.  . sertraline (ZOLOFT) 50 MG tablet TAKE 1 TABLET EVERY DAY  . tolnaftate (TINACTIN) 1 % cream Apply 1 application topically daily as needed (foot fungus).   . TRUEplus Lancets 33G MISC Use as directed to check blood sugars 2 times per day dx: e11.22   No current facility-administered medications for this visit. (Other)      REVIEW OF SYSTEMS: ROS    Positive for: Eyes   Last edited by Kingsley Spittle, COT on 10/28/2020 10:38 AM. (History)       ALLERGIES Allergies  Allergen Reactions  . Other Anaphylaxis and Rash    Bleach  . Penicillins Hives    Did it involve swelling of the face/tongue/throat, SOB, or low BP? No Did it involve sudden or severe rash/hives, skin peeling, or any reaction on the inside of your mouth or nose? Yes Did you need to seek medical attention at a hospital or doctor's office? Yes When did it last happen?Childhood allergy  If all above answers are "NO", may proceed with cephalosporin use.    . Sulfa Antibiotics Hives  . Aspirin Other (See Comments)     On aspirin 81 mg - Rectal bleeding in dec 2018  . Codeine     Headache and makes the patient feel "off"    PAST MEDICAL HISTORY Past Medical History:  Diagnosis Date  . Abnormal liver function     in the past.  . Anemia   . Arthritis    all over  . Bruises easily   . Cataract    right  eye;immature  . CHF (congestive heart failure) ([place])   . Chronic back pain    stenosis  . Chronic cough   . Chronic kidney disease   . COPD (chronic obstructive pulmonary disease) ([place])   . COVID    aug/sept 2021  . Demand myocardial infarction [facility]) 2012   Demand Infarction in setting of Pancreatitis --> mild Troponin elevation, NON-OBSTRUCTIVE CAD  . Depression    takes Abilify daily as well as Zoloft  . Diastolic heart failure 9528   Grade 1 diastolic Dysfunction by Echo   . Diverticulosis   . DM (  diabetes mellitus) ([place])    takes Victoza daily as well as Lantus and Humalog  . Empty sella ([place])    on MRI in 2009.  . Glaucoma   . Headache(784.0)    last migraine-4-43yr ago  . History of blood transfusion    no abnormal reaction noted  . History of colon polyps    benign  . HTN (hypertension)    takes BKinder Morgan Energydaily  . Hyperlipidemia    takes Lipitor daily  . Joint swelling   . Nocturia   . Obesity hypoventilation syndrome ([place]   . Obstructive sleep apnea 02/2018   Notably improved split-night study with weight loss from 270 (2017) down to 250 pounds (2019).  . Pancreatitis    takes Pancrelipase daily  . Parkinson's disease ([place]    takes Sinemet daily  . Peripheral neuropathy   . Pneumonia 2012  . Seizures ([place]    takes Lamictal daily and Primidone nightly;last seizure 2wks ago  . Urinary urgency    With increased frequency  . Varicose veins of both lower extremities with pain    With edema.  Takes daily Lasix   Past Surgical History:  Procedure Laterality Date  . ABDOMINAL HYSTERECTOMY    . APPENDECTOMY    . BACK SURGERY    . Cardiac Event Monitor  September-October 2017   Sinus rhythm with occasional PACs and artifact. No arrhythmias besides one short run of tachycardia.  . CHOLECYSTECTOMY    . COLONOSCOPY N/A 08/15/2012   Procedure: COLONOSCOPY;  Surgeon: WArta Silence MD;  Location: [facility];  Service: Endoscopy;  Laterality:  N/A;  . COLONOSCOPY WITH PROPOFOL Left 06/17/2017   Procedure: COLONOSCOPY WITH PROPOFOL;  Surgeon: KRonnette Juniper MD;  [facility]  Service: Gastroenterology;  Laterality: Left;  . CORONARY CALCIUM SCORE AND CTA  04/2018   Coronary calcium score 71.9.  Very large, hyperdynamic LAD wrapping apex giving rise to PDA.  Moderate LAD-diagonal and circumflex plaque -> CT FFR suggested positive findings in distal D1, D2 and distal circumflex.  Referred for cath. ==> FALSE POSITIVE  . ESOPHAGOGASTRODUODENOSCOPY    . eye cysts Bilateral   . LASIK    . LEFT HEART CATH AND CORONARY ANGIOGRAPHY N/A 05/16/2018   Procedure: LEFT HEART CATH AND CORONARY ANGIOGRAPHY;  Surgeon: SBelva Crome MD;  Location: [facility] INVASIVE CV LAB:  Angiographically normal coronary arteries with LVEDP of 21 mmHg.  Large draping hyperdominant LAD that wraps the apex and provides distal half of the PDA.  Relatively small caliber distal Cx -->proxLPDA, non-dom RCA. No Cx or Diag lesions -- FALSE + CT FFR  . LEFT HEART CATH AND CORONARY ANGIOGRAPHY  2009/March 2012   2009: (Dr. KAlvie [place] Nonobstructive CAD; 2012: Minimal CAD --> false positive stress test  . NM MYOVIEW LTD  01/2016; 01/2018   a)LOW RISK. No ischemia or infarction;; b) EF 55-60%. NO ST changes.  No ischemia or Infarction. NO significant RV enlargement.  LOW RISK.  .Marland KitchenPolysomnogram  02/2018   (Dr. DBrett Fairyfrom neurology): Split study was not adequately done due to low AHI.  She slept reclined and had an AHI less than 10, prolonged hypoxemia and no hypercapnia. -->  She has home oxygen already prescribed, but did not meet criteria for nightly oxygen.  (Was noted that the patient did not cooperate well with study.  Titration was discussed.  .Marland KitchenPRESSURE SENSOR/CARDIOMEMS N/A 11/09/2019   Procedure: PRESSURE SENSOR/CARDIOMEMS;  Surgeon: MLarey Dresser MD;  [facility];  Service: Cardiovascular;  Laterality: N/A;  . RECTAL POLYPECTOMY    . RIGHT/LEFT  HEART CATH AND CORONARY ANGIOGRAPHY N/A 04/06/2019   Procedure: RIGHT/LEFT HEART CATH AND CORONARY ANGIOGRAPHY;  Surgeon: Leonie Man, MD;  Location[facility] INVASIVE CV LAB; angiographically normal coronary arteries.  Moderately elevated AD LVEDP..  Mild pulmonary hypertension: PA P 56/14 mmHg-mean 31 mmHg.  RAP 10 mmHg.  RV P-EDP 54/4 mmHg - 12 mmHg.  PCWP 11-15 mmHg.  CO-CI: 7.35-3.73.  . TONSILLECTOMY    . TRANSTHORACIC ECHOCARDIOGRAM  01/2016; 05/2017   A) Normal LV chamber size with mod LVH pattern. EF 50-55%. Severe LA dilation. Mod RA dilation. PAP elevated at 38 mmHg;; B) Mild concentric LVH.  EF 55-60% & no RWMA.  Grade 2 DD.  Diastolic flattening of the ventricular septum == ? elevated PAP.  Mild aortic valve calcification/sclerosis.  Mod LA dilation.  Mild RV dilation & Mod RA dilation  . TRANSTHORACIC ECHOCARDIOGRAM  10/2017; 01/2018   a) EF 55-60%.  No RWMA,  Moderate LA dilation.  Mild LA dilation.  Peak PA pressures ~57 mmHg (moderate);;; b) EF 55-60%.  GRII DD.  Suggestion of RV volume overload with moderate RV dilation.  Severe LA and RA dilation.Marland Kitchen  PA pressure ~67%.  . TRANSTHORACIC ECHOCARDIOGRAM  01/2019   Normal LV size and function.  EF 50 to 55%.  Impaired relaxation.  RV appears to have moderately reduced function.  Severely enlarged.  Cannot fully assess RV pressures.  Hypermobile interatrial septum.  Right atrium severely dilated.  . TUBAL LIGATION    . vaginal cyst removed     several times    FAMILY HISTORY Family History  Problem Relation Age of Onset  . Allergies Father   . Heart disease Father        before 17  . Heart failure Father   . Hypertension Father   . Hyperlipidemia Father   . Heart disease Mother   . Diabetes Mother   . Hypertension Mother   . Hyperlipidemia Mother   . Hypertension Sister   . Heart disease Sister        before 54  . Hyperlipidemia Sister   . Diabetes Brother   . Hypertension Brother   . Diabetes Sister   . Hypertension  Sister   . Diabetes Son   . Hypertension Son   . Cancer Other     SOCIAL HISTORY Social History   Tobacco Use  . Smoking status: Former Smoker    Packs/day: 0.50    Years: 25.00    Pack years: 12.50    Types: Cigarettes  . Smokeless tobacco: Never Used  . Tobacco comment: quit smoking 20+ytrs ago  Vaping Use  . Vaping Use: Never used  Substance Use Topics  . Alcohol use: No  . Drug use: No         OPHTHALMIC EXAM:  Base Eye Exam    Visual Acuity (Snellen - Linear)      Right Left   Dist cc 20/60 -1 20/30 -2   Dist ph cc 20/30 -2 NI   Correction: Glasses       Tonometry (Tonopen, 10:47 AM)      Right Left   Pressure 14 15       Pupils      Dark Light Shape React APD   Right 3 2 Round Brisk None   Left 3 2 Round Brisk None       Visual Fields (Counting fingers)  Left Right    Full Full       Extraocular Movement      Right Left    Full, Ortho Full, Ortho       Neuro/Psych    Oriented x3: Yes   Mood/Affect: Normal       Dilation    Both eyes: 1.0% Mydriacyl, 2.5% Phenylephrine @ 10:48 AM        Slit Lamp and Fundus Exam    Slit Lamp Exam      Right Left   Lids/Lashes Dermatochalasis - upper lid Dermatochalasis - upper lid   Conjunctiva/Sclera mild melanosis mild melanosis   Cornea Mild arcus, Trace endo pigment Mild arcus, trace PEE   Anterior Chamber deep, clear, narrow angle, PI at 0900 deep, clear, narrow angle, PI at 0900   Iris Round and dilated, No NVI Round and dilated, No NVI   Lens PC IOL in good position PC IOL in good position   Vitreous Vitreous syneresis, Posterior vitreous detachment, Weiss ring Vitreous syneresis, Posterior vitreous detachment, Weiss ring       Fundus Exam      Right Left   Disc Pink and Sharp, focal PPA/PPP mild Pallor, Sharp rim   C/D Ratio 0.6 0.6   Macula Flat, Blunted foveal reflex, RPE mottling and clumping, No heme or edema Flat, Blunted foveal reflex, RPE mottling and clumping, No heme or  edema   Vessels attenuated, Tortuous attenuated, Tortuous   Periphery Attached, pigmented CR scar/lattice at 0430, focal pigmented lattice at 0730 with atrophic hole and +focal SRF, shallow schisis anterior to lattice Attached, focal pigmented lattice at 0430 and 0500 (atrophic hole)        Refraction    Wearing Rx      Sphere Cylinder Axis Add   Right +0.00 +1.25 053 +2.25   Left -0.75 +1.00 118 +2.25          IMAGING AND PROCEDURES  Imaging and Procedures for 10/28/2020  Repair Retinal Breaks, Laser - OS - Left Eye       Time Out Confirmed correct patient, procedure, site, and patient consented.   Anesthesia Topical anesthesia was used. Anesthetic medications included Proparacaine 0.5%.   Post-op The patient tolerated the procedure well. There were no complications. The patient received written and verbal post procedure care education.   Notes LASER PROCEDURE NOTE  Procedure:  Barrier laser retinopexy using slit lamp laser, LEFT eye   Diagnosis:   Lattice degeneration w/ atrophic holes, LEFT eye                     Focal patches of lattice at 0430 and 0500  Surgeon: Bernarda Caffey, MD, PhD  Anesthesia: Topical  Informed consent obtained, operative eye marked, and time out performed prior to initiation of laser.   Laser settings:  Lumenis [id number] laser, slit lamp Lens: Mainster PRP 165 Power: 250 mW Spot size: 200 microns Duration: 30 msec  # spots: 346  Placement of laser: Using a Mainster PRP 165 contact lens at the slit lamp, laser was placed in three confluent rows around patches of lattice w/ atrophic holes at 430 and 500 oclock anterior to equator with additional rows anteriorly.  Complications: None.  Patient tolerated the procedure well and received written and verbal post-procedure care information/education.        OCT, Retina - OU - Both Eyes       Right Eye Quality was borderline. Central Foveal Thickness: 320. Progression has worsened.  Findings include abnormal foveal contour, no IRF, no SRF (Foveal notch; interval development of vitreous opacities; peripheral schisis and focal SRF with retinal break caught on widefield -- not imaged today).   Left Eye Quality was borderline. Central Foveal Thickness: 256. Progression has been stable. Findings include normal foveal contour, no IRF, no SRF (Rare drusen).   Notes *Images captured and stored on drive  Diagnosis / Impression:  OD: Foveal notch; interval development of vitreous opacities; peripheral schisis and focal SRF with retinal break caught on widefield -- not imaged today OS: NFP, no IRF/SRF  Clinical management:  See below  Abbreviations: NFP - Normal foveal profile. CME - cystoid macular edema. PED - pigment epithelial detachment. IRF - intraretinal fluid. SRF - subretinal fluid. EZ - ellipsoid zone. ERM - epiretinal membrane. ORA - outer retinal atrophy. ORT - outer retinal tubulation. SRHM - subretinal hyper-reflective material. IRHM - intraretinal hyper-reflective material               ASSESSMENT/PLAN:    ICD-10-CM   1. Bilateral retinal lattice degeneration  H35.413 Repair Retinal Breaks, Laser - OS - Left Eye  2. Retinal hole of both eyes  H33.323 Repair Retinal Breaks, Laser - OS - Left Eye  3. Retinal detachment, right  H33.21   4. Retinal edema  H35.81 OCT, Retina - OU - Both Eyes  5. Right retinoschisis  H33.101   6. Diabetes mellitus type 2 without retinopathy ([place])  E11.9   7. Essential hypertension  I10   8. Hypertensive retinopathy of both eyes  H35.033   9. Pseudophakia, both eyes  Z96.1     1-4. Lattice degeneration w/ atrophic holes, both eyes - OD: focal pigmented lattice at 0430 and 0730 with atrophic hole, +focal SRF - OS: focal pigmented lattice at 0430 and 0500 (atrophic hole) - s/p laser retinopexy OD (05.04.22) - recommend laser retinopexy OS today, 05.06.22 - pt wishes to proceed with laser - RBA of procedure discussed,  questions answered - informed consent obtained and signed - see procedure note - start PF QID OS x7 days - f/u 3 weeks, DFE, OCT  5. Retinoschisis OD  - shallow peripheral retinoschisis in IT quad, anterior to 0730 lattice w/ break  - discussed findings, prognosis and treatment options   - s/p laser retinopexy OD (05.04.22) along with pexy for lattice above  6. Diabetes mellitus, type 2 without retinopathy - The incidence, risk factors for progression, natural history and treatment options for diabetic retinopathy  were discussed with patient.   - The need for close monitoring of blood glucose, blood pressure, and serum lipids, avoiding cigarette or any type of tobacco, and the need for long term follow up was also discussed with patient. - f/u in 1 year, sooner prn  7,8. Hypertensive retinopathy OU - discussed importance of tight BP control - monitor  9. Pseudophakia OU  - s/p CE/IOL OU  - IOL in good position, doing well - monitor    Ophthalmic Meds Ordered this visit:  No orders of the defined types were placed in this encounter.      Return in about 3 weeks (around 11/18/2020) for post laser for lattice OU.  There are no Patient Instructions on file for this visit.   Explained the diagnoses, plan, and follow up with the patient and they expressed understanding.  Patient expressed understanding of the importance of proper follow up care.   This document serves as a record of services personally performed by Sharyon Cable.  Coralyn Pear, MD, PhD. It was created on their behalf by San Jetty. Owens Shark, OA an ophthalmic technician. The creation of this record is the provider's dictation and/or activities during the visit.    Electronically signed by: San Jetty. Marguerita Merles 05.06.2022 12:33 PM  Gardiner Sleeper, M.D., Ph.D. Diseases & Surgery of the Retina and Vitreous [facility]  I have reviewed the above documentation for accuracy and completeness, and I agree with  the above. Gardiner Sleeper, M.D., Ph.D. 10/28/20 12:33 PM   Abbreviations: M myopia (nearsighted); A astigmatism; H hyperopia (farsighted); P presbyopia; Mrx spectacle prescription;  CTL contact lenses; OD right eye; OS left eye; OU both eyes  XT exotropia; ET esotropia; PEK punctate epithelial keratitis; PEE punctate epithelial erosions; DES dry eye syndrome; MGD meibomian gland dysfunction; ATs artificial tears; PFAT's preservative free artificial tears; [place] nuclear sclerotic cataract; PSC posterior subcapsular cataract; ERM epi-retinal membrane; PVD posterior vitreous detachment; RD retinal detachment; DM diabetes mellitus; DR diabetic retinopathy; NPDR non-proliferative diabetic retinopathy; PDR proliferative diabetic retinopathy; CSME clinically significant macular edema; DME diabetic macular edema; dbh dot blot hemorrhages; CWS cotton wool spot; POAG primary open angle glaucoma; C/D cup-to-disc ratio; HVF humphrey visual field; GVF goldmann visual field; OCT optical coherence tomography; IOP intraocular pressure; BRVO Branch retinal vein occlusion; CRVO central retinal vein occlusion; CRAO central retinal artery occlusion; BRAO branch retinal artery occlusion; RT retinal tear; SB scleral buckle; PPV pars plana vitrectomy; VH Vitreous hemorrhage; PRP panretinal laser photocoagulation; IVK intravitreal kenalog; VMT vitreomacular traction; MH Macular hole;  NVD neovascularization of the disc; NVE neovascularization elsewhere; AREDS age related eye disease study; ARMD age related macular degeneration; POAG primary open angle glaucoma; EBMD epithelial/anterior basement membrane dystrophy; ACIOL anterior chamber intraocular lens; IOL intraocular lens; PCIOL posterior chamber intraocular lens; Phaco/IOL phacoemulsification with intraocular lens placement; [place] photorefractive keratectomy; LASIK laser assisted in situ keratomileusis; HTN hypertension; DM diabetes mellitus; COPD chronic obstructive pulmonary  disease

## 2020-11-01 ENCOUNTER — Other Ambulatory Visit: Payer: Self-pay

## 2020-11-01 MED ORDER — DEXLANSOPRAZOLE 60 MG PO CPDR: 60.0000 mg | DELAYED_RELEASE_CAPSULE | Freq: Every day | ORAL | 2 refills | Status: DC

## 2020-11-01 MED ORDER — ISOSORBIDE MONONITRATE ER 30 MG PO TB24: 30.0000 mg | ORAL_TABLET | Freq: Every day | ORAL | 1 refills | Status: DC

## 2020-11-02 ENCOUNTER — Other Ambulatory Visit: Payer: Self-pay | Admitting: Internal Medicine

## 2020-11-02 ENCOUNTER — Encounter (INDEPENDENT_AMBULATORY_CARE_PROVIDER_SITE_OTHER): Payer: Medicare HMO | Admitting: Ophthalmology

## 2020-11-02 DIAGNOSIS — G4733 Obstructive sleep apnea (adult) (pediatric): Secondary | ICD-10-CM | POA: Diagnosis not present

## 2020-11-04 ENCOUNTER — Encounter (HOSPITAL_COMMUNITY): Payer: Medicare HMO | Admitting: Cardiology

## 2020-11-08 ENCOUNTER — Other Ambulatory Visit: Payer: Self-pay

## 2020-11-08 ENCOUNTER — Other Ambulatory Visit (INDEPENDENT_AMBULATORY_CARE_PROVIDER_SITE_OTHER): Payer: Medicare HMO

## 2020-11-08 ENCOUNTER — Telehealth: Payer: Self-pay

## 2020-11-08 DIAGNOSIS — R059 Cough, unspecified: Secondary | ICD-10-CM | POA: Diagnosis not present

## 2020-11-08 DIAGNOSIS — Z1152 Encounter for screening for COVID-19: Secondary | ICD-10-CM | POA: Diagnosis not present

## 2020-11-08 DIAGNOSIS — R0981 Nasal congestion: Secondary | ICD-10-CM | POA: Diagnosis not present

## 2020-11-08 LAB — POC COVID19 BINAXNOW: SARS Coronavirus 2 Ag: NEGATIVE

## 2020-11-08 MED ORDER — OZEMPIC (1 MG/DOSE) 4 MG/3ML ~~LOC~~ SOPN: PEN_INJECTOR | SUBCUTANEOUS | 3 refills | Status: DC

## 2020-11-08 MED ORDER — DEXLANSOPRAZOLE 60 MG PO CPDR
60.0000 mg | DELAYED_RELEASE_CAPSULE | Freq: Every day | ORAL | 2 refills | Status: DC
Start: 2020-11-08 — End: 2021-11-11

## 2020-11-08 NOTE — Telephone Encounter (Signed)
I called the pt to check on the pt, the pt received pneumonia and covid booster today, the pt came earlier for a covid test and the office was calling to see why she didn't wait.

## 2020-11-09 LAB — SARS-COV-2, NAA 2 DAY TAT

## 2020-11-09 LAB — NOVEL CORONAVIRUS, NAA: SARS-CoV-2, NAA: NOT DETECTED

## 2020-11-10 ENCOUNTER — Ambulatory Visit (INDEPENDENT_AMBULATORY_CARE_PROVIDER_SITE_OTHER): Payer: Medicare HMO | Admitting: Nurse Practitioner

## 2020-11-10 ENCOUNTER — Other Ambulatory Visit: Payer: Self-pay

## 2020-11-10 ENCOUNTER — Encounter: Payer: Self-pay | Admitting: Nurse Practitioner

## 2020-11-10 DIAGNOSIS — R059 Cough, unspecified: Secondary | ICD-10-CM

## 2020-11-10 DIAGNOSIS — R0981 Nasal congestion: Secondary | ICD-10-CM

## 2020-11-10 LAB — POC COVID19 BINAXNOW: SARS Coronavirus 2 Ag: NEGATIVE

## 2020-11-10 MED ORDER — AZITHROMYCIN 250 MG PO TABS: ORAL_TABLET | ORAL | 0 refills | Status: DC

## 2020-11-10 MED ORDER — BENZONATATE 100 MG PO CAPS: 100.0000 mg | ORAL_CAPSULE | Freq: Three times a day (TID) | ORAL | 1 refills | Status: AC | PRN

## 2020-11-10 NOTE — Progress Notes (Signed)
This visit occurred during the SARS-CoV-2 public health emergency.  Safety protocols were in place, including screening questions prior to the visit, additional usage of staff PPE, and extensive cleaning of exam room while observing appropriate contact time as indicated for disinfecting solutions.  Subjective:     Patient ID: [name] , female    DOB: 28-Apr-1946 , 75 y.o.   MRN: [id number]   Chief Complaint  Patient presents with  . Cough    HPI  Patient is here for cough, congestion. No fever. She has done a rapid covid test but it was negative. She still is feeling very sick. She feels like she has no energy. She is coughing up some phelm.   Cough This is a recurrent problem. The current episode started in the past 7 days. Associated symptoms include headaches, nasal congestion and rhinorrhea. Pertinent negatives include no chest pain, fever, myalgias, shortness of breath or wheezing.     Past Medical History:  Diagnosis Date  . Abnormal liver function     in the past.  . Anemia   . Arthritis    all over  . Bruises easily   . Cataract    right eye;immature  . CHF (congestive heart failure) ([place])   . Chronic back pain    stenosis  . Chronic cough   . Chronic kidney disease   . COPD (chronic obstructive pulmonary disease) ([place])   . COVID    aug/sept 2021  . Demand myocardial infarction [facility]) 2012   Demand Infarction in setting of Pancreatitis --> mild Troponin elevation, NON-OBSTRUCTIVE CAD  . Depression    takes Abilify daily as well as Zoloft  . Diastolic heart failure 4854   Grade 1 diastolic Dysfunction by Echo   . Diverticulosis   . DM (diabetes mellitus) ([place])    takes Victoza daily as well as Lantus and Humalog  . Empty sella ([place])    on MRI in 2009.  . Glaucoma   . Headache(784.0)    last migraine-4-40yrs ago  . History of blood transfusion    no abnormal reaction noted  . History of colon polyps    benign  . HTN (hypertension)    takes  [employer] daily  . Hyperlipidemia    takes Lipitor daily  . Joint swelling   . Nocturia   . Obesity hypoventilation syndrome ([place])   . Obstructive sleep apnea 02/2018   Notably improved split-night study with weight loss from 270 (2017) down to 250 pounds (2019).  . Pancreatitis    takes Pancrelipase daily  . Parkinson's disease ([place])    takes Sinemet daily  . Peripheral neuropathy   . Pneumonia 2012  . Seizures ([place])    takes Lamictal daily and Primidone nightly;last seizure 2wks ago  . Urinary urgency    With increased frequency  . Varicose veins of both lower extremities with pain    With edema.  Takes daily Lasix     Family History  Problem Relation Age of Onset  . Allergies Father   . Heart disease Father        before 60  . Heart failure Father   . Hypertension Father   . Hyperlipidemia Father   . Heart disease Mother   . Diabetes Mother   . Hypertension Mother   . Hyperlipidemia Mother   . Hypertension Sister   . Heart disease Sister        before 94  . Hyperlipidemia Sister   . Diabetes  Brother   . Hypertension Brother   . Diabetes Sister   . Hypertension Sister   . Diabetes Son   . Hypertension Son   . Cancer Other      Current Outpatient Medications:  .  azithromycin (ZITHROMAX Z-PAK) 250 MG tablet, Take 2 tablets (500 mg) on  Day 1,  followed by 1 tablet (250 mg) once daily on Days 2 through 5., Disp: 6 each, Rfl: 0 .  benzonatate (TESSALON PERLES) 100 MG capsule, Take 1 capsule (100 mg total) by mouth 3 (three) times daily as needed for cough., Disp: 30 capsule, Rfl: 1 .  acetaminophen (TYLENOL) 500 MG tablet, Take 500-1,000 mg by mouth every 4 (four) hours as needed for moderate pain. , Disp: , Rfl:  .  albuterol (VENTOLIN HFA) 108 (90 Base) MCG/ACT inhaler, Inhale 1-2 puffs into the lungs every 6 (six) hours as needed for wheezing or shortness of breath., Disp: , Rfl:  .  Alcohol Swabs (B-D SINGLE USE SWABS REGULAR) PADS, USE TWICE  DAILY AS DIRECTED, Disp: 200 each, Rfl: 6 .  Ascorbic Acid (VITAMIN C WITH ROSE HIPS) 1000 MG tablet, Take 1,000 mg by mouth daily., Disp: , Rfl:  .  atorvastatin (LIPITOR) 10 MG tablet, TAKE 1 TABLET EVERY EVENING., Disp: 90 tablet, Rfl: 2 .  B-D ULTRAFINE III SHORT PEN 31G X 8 MM MISC, USE DAILY WITH VICTOZA AND/OR NOVOLOG., Disp: 400 each, Rfl: 1 .  Blood Glucose Calibration (TRUE METRIX LEVEL 1) Low SOLN, Use as directed  Dx:e11.22, Disp: 1 each, Rfl: 2 .  Blood Glucose Monitoring Suppl (TRUE METRIX METER) w/Device KIT, Use as directed to check blood sugars 2 times per day dx: e11.22, Disp: 1 kit, Rfl: 1 .  carvedilol (COREG) 25 MG tablet, TAKE 1 TABLET TWICE DAILY WITH A MEAL, Disp: 180 tablet, Rfl: 2 .  Cholecalciferol (VITAMIN D-3) 1000 units CAPS, Take 5,000 Units by mouth daily. , Disp: , Rfl:  .  dexlansoprazole (DEXILANT) 60 MG capsule, Take 1 capsule (60 mg total) by mouth daily., Disp: 90 capsule, Rfl: 2 .  diclofenac sodium (VOLTAREN) 1 % GEL, Apply 2 g topically 4 (four) times daily as needed (pain)., Disp: , Rfl:  .  ferrous sulfate 324 MG TBEC, Take 324 mg by mouth 2 (two) times daily., Disp: , Rfl:  .  furosemide (LASIX) 80 MG tablet, Take 1 tablet (80 mg total) by mouth daily. 22m daily Monday-Friday, 482mon Saturday and Sunday, Disp: 90 tablet, Rfl: 0 .  glucose blood (TRUE METRIX BLOOD GLUCOSE TEST) test strip, Use as directed to check blood sugars 2 times per day dx: e11.22, Disp: 300 each, Rfl: 2 .  hydrALAZINE (APRESOLINE) 100 MG tablet, Take 1 tablet (100 mg total) by mouth 3 (three) times daily., Disp: 270 tablet, Rfl: 3 .  hydrocortisone cream 1 %, Apply 1 application topically daily as needed for itching. , Disp: , Rfl:  .  insulin glargine, 2 Unit Dial, (TOUJEO MAX SOLOSTAR) 300 UNIT/ML Solostar Pen, Inject 25 Units into the skin at bedtime., Disp: , Rfl:  .  insulin lispro (HUMALOG KWIKPEN) 100 UNIT/ML KwikPen, Inject 0.02-0.04 mLs (2-4 Units total) into the skin 3  (three) times daily as needed (high blood sugar over 150)., Disp: 15 mL, Rfl: 2 .  Isopropyl Alcohol (ALCOHOL WIPES) 70 % MISC, Apply 1 each topically 2 (two) times daily., Disp: 300 each, Rfl: 3 .  isosorbide mononitrate (IMDUR) 30 MG 24 hr tablet, Take 1 tablet (30 mg total)  by mouth daily., Disp: 30 tablet, Rfl: 1 .  lamoTRIgine (LAMICTAL) 150 MG tablet, Take 1 tablet (150 mg total) by mouth 2 (two) times daily., Disp: 180 tablet, Rfl: 3 .  levETIRAcetam (KEPPRA XR) 500 MG 24 hr tablet, Take 3 tablets every night, Disp: 270 tablet, Rfl: 3 .  Menthol, Topical Analgesic, (STOPAIN ROLL-ON EX), Apply 1 application topically daily as needed (pain)., Disp: , Rfl:  .  neomycin-bacitracin-polymyxin (NEOSPORIN) ointment, Apply 1 application topically as needed for wound care. , Disp: , Rfl:  .  ondansetron (ZOFRAN) 4 MG tablet, , Disp: , Rfl:  .  OZEMPIC, 1 MG/DOSE, 4 MG/3ML SOPN, INJECT 1 MG INTO THE SKIN ONCE A WEEK., Disp: 9 mL, Rfl: 3 .  Pancrelipase, Lip-Prot-Amyl, (ZENPEP PO), Take by mouth., Disp: , Rfl:  .  Pancrelipase, Lip-Prot-Amyl, 25000-79000 units CPEP, Take 1-2 capsules by mouth See admin instructions. Take 2 capsules in the morning, 1 capsule with lunch, and 2 capsules in the evening., Disp: , Rfl:  .  potassium chloride SA (KLOR-CON) 20 MEQ tablet, TAKE 2 TABLETS EVERY DAY BUT WHEN TAKING METOLAZONE TAKE 3 TABLETS DAILY AS DIRECTED, Disp: 204 tablet, Rfl: 2 .  sennosides-docusate sodium (SENOKOT-S) 8.6-50 MG tablet, Take 1 tablet by mouth daily., Disp: , Rfl:  .  sertraline (ZOLOFT) 50 MG tablet, TAKE 1 TABLET EVERY DAY, Disp: 90 tablet, Rfl: 2 .  tolnaftate (TINACTIN) 1 % cream, Apply 1 application topically daily as needed (foot fungus). , Disp: , Rfl:  .  TRUEplus Lancets 33G MISC, Use as directed to check blood sugars 2 times per day dx: e11.22, Disp: 300 each, Rfl: 2   Allergies  Allergen Reactions  . Other Anaphylaxis and Rash    Bleach  . Penicillins Hives    Did it involve  swelling of the face/tongue/throat, SOB, or low BP? No Did it involve sudden or severe rash/hives, skin peeling, or any reaction on the inside of your mouth or nose? Yes Did you need to seek medical attention at a hospital or doctor's office? Yes When did it last happen?Childhood allergy  If all above answers are "NO", may proceed with cephalosporin use.    . Sulfa Antibiotics Hives  . Aspirin Other (See Comments)     On aspirin 81 mg - Rectal bleeding in dec 2018  . Codeine     Headache and makes the patient feel "off"     Review of Systems  Constitutional: Negative for fatigue and fever.  HENT: Positive for congestion, rhinorrhea, sinus pressure and sinus pain.   Eyes: Negative for pain and visual disturbance.  Respiratory: Positive for cough. Negative for chest tightness, shortness of breath and wheezing.   Cardiovascular: Negative for chest pain and palpitations.  Gastrointestinal: Negative for diarrhea and nausea.  Musculoskeletal: Negative for back pain and myalgias.  Neurological: Positive for headaches. Negative for tremors and weakness.     Today's Vitals   There is no height or weight on file to calculate BMI.   Objective:  Physical Exam Constitutional:      Appearance: Normal appearance.  HENT:     Head: Normocephalic and atraumatic.     Right Ear: Tympanic membrane normal.     Left Ear: Tympanic membrane normal.     Nose: Congestion and rhinorrhea present.  Eyes:     Pupils: Pupils are equal, round, and reactive to light.  Cardiovascular:     Rate and Rhythm: Normal rate and regular rhythm.     Pulses: Normal  pulses.     Heart sounds: Normal heart sounds. No murmur heard.   Pulmonary:     Effort: Pulmonary effort is normal. No respiratory distress.     Breath sounds: Normal breath sounds. No wheezing.  Skin:    General: Skin is warm and dry.     Capillary Refill: Capillary refill takes less than 2 seconds.  Neurological:     General: No focal  deficit present.     Mental Status: She is alert and oriented to person, place, and time.  Psychiatric:        Mood and Affect: Mood normal.        Behavior: Behavior normal.         Assessment And Plan:     1. Sinus congestion -will go ahead and treat patient with antx due to age and commodities . -Advised patient she can take OTC coricidin as needed.  -Hydrate with a lot of water.  - azithromycin (ZITHROMAX Z-PAK) 250 MG tablet; Take 2 tablets (500 mg) on  Day 1,  followed by 1 tablet (250 mg) once daily on Days 2 through 5.  Dispense: 6 each; Refill: 0  2. Cough - POC COVID-19- neg  -Prior PCR Covid test was negative as well.  - benzonatate (TESSALON PERLES) 100 MG capsule; Take 1 capsule (100 mg total) by mouth 3 (three) times daily as needed for cough.  Dispense: 30 capsule; Refill: 1   Advised patient to take Vitamin C, D, Zinc. Keep yourself hydrated with a lot of water and rest. Take Delsym for cough and Mucinex. Take Tylenol or pain reliever every 4-6 hours as needed for pain/fever/body ache. Educated patient if symptoms get worse or if she experiences any SOB or pain in her legs to seek immediate emergency care. Continue to monitor your pulse oxygen. Call us if you have any questions. Quarantine for 5 days if tested positive and no symptoms or 10 days if tested positive and have symptoms. Wear a mask around other people.   Follow up: if symptoms persist or do not get better.   Side effects and appropriate use of all the medication(s) were discussed with the patient today. Patient advised to use the medication(s) as directed by their healthcare provider. The patient was encouraged to read, review, and understand all associated package inserts and contact our office with any questions or concerns. The patient accepts the risks of the treatment plan and had an opportunity to ask questions.   The patient was encouraged to call or send a message through [place] for any questions or  concerns.   Patient was given opportunity to ask questions. Patient verbalized understanding of the plan and was able to repeat key elements of the plan. All questions were answered to their satisfaction.  Raman Brandis Wixted, DNP   I, Raman Kandee Escalante have reviewed all documentation for this visit. The documentation on 11/10/20 for the exam, diagnosis, procedures, and orders are all accurate and complete.    THE PATIENT IS ENCOURAGED TO PRACTICE SOCIAL DISTANCING DUE TO THE COVID-19 PANDEMIC.

## 2020-11-10 NOTE — Progress Notes (Signed)
I,[name],acting as a Education administrator for [employer], NP.,have documented all relevant documentation on the behalf of [employer], NP,as directed by  Bary Castilla, NP while in the presence of Bary Castilla, NP.  This visit occurred during the SARS-CoV-2 public health emergency.  Safety protocols were in place, including screening questions prior to the visit, additional usage of staff PPE, and extensive cleaning of exam room while observing appropriate contact time as indicated for disinfecting solutions.  Subjective:     Patient ID: [name] , female    DOB: 1945/10/16 , 75 y.o.   MRN: [id number]   Chief Complaint  Patient presents with  . Cough    HPI  HPI   Past Medical History:  Diagnosis Date  . Abnormal liver function     in the past.  . Anemia   . Arthritis    all over  . Bruises easily   . Cataract    right eye;immature  . CHF (congestive heart failure) ([place])   . Chronic back pain    stenosis  . Chronic cough   . Chronic kidney disease   . COPD (chronic obstructive pulmonary disease) ([place])   . COVID    aug/sept 2021  . Demand myocardial infarction [facility]) 2012   Demand Infarction in setting of Pancreatitis --> mild Troponin elevation, NON-OBSTRUCTIVE CAD  . Depression    takes Abilify daily as well as Zoloft  . Diastolic heart failure 7517   Grade 1 diastolic Dysfunction by Echo   . Diverticulosis   . DM (diabetes mellitus) ([place])    takes Victoza daily as well as Lantus and Humalog  . Empty sella ([place])    on MRI in 2009.  . Glaucoma   . Headache(784.0)    last migraine-4-67yr ago  . History of blood transfusion    no abnormal reaction noted  . History of colon polyps    benign  . HTN (hypertension)    takes [employer]daily  . Hyperlipidemia    takes Lipitor daily  . Joint swelling   . Nocturia   . Obesity hypoventilation syndrome ([place]   . Obstructive sleep apnea 02/2018   Notably improved split-night  study with weight loss from 270 (2017) down to 250 pounds (2019).  . Pancreatitis    takes Pancrelipase daily  . Parkinson's disease ([place]    takes Sinemet daily  . Peripheral neuropathy   . Pneumonia 2012  . Seizures ([place]    takes Lamictal daily and Primidone nightly;last seizure 2wks ago  . Urinary urgency    With increased frequency  . Varicose veins of both lower extremities with pain    With edema.  Takes daily Lasix     Family History  Problem Relation Age of Onset  . Allergies Father   . Heart disease Father        before 632 . Heart failure Father   . Hypertension Father   . Hyperlipidemia Father   . Heart disease Mother   . Diabetes Mother   . Hypertension Mother   . Hyperlipidemia Mother   . Hypertension Sister   . Heart disease Sister        before 623 . Hyperlipidemia Sister   . Diabetes Brother   . Hypertension Brother   . Diabetes Sister   . Hypertension Sister   . Diabetes Son   . Hypertension Son   . Cancer Other      Current Outpatient Medications:  .  acetaminophen (TYLENOL) 500 MG tablet, Take 500-1,000 mg by mouth every 4 (four) hours as needed for moderate pain. , Disp: , Rfl:  .  albuterol (VENTOLIN HFA) 108 (90 Base) MCG/ACT inhaler, Inhale 1-2 puffs into the lungs every 6 (six) hours as needed for wheezing or shortness of breath., Disp: , Rfl:  .  Alcohol Swabs (B-D SINGLE USE SWABS REGULAR) PADS, USE TWICE DAILY AS DIRECTED, Disp: 200 each, Rfl: 6 .  Ascorbic Acid (VITAMIN C WITH ROSE HIPS) 1000 MG tablet, Take 1,000 mg by mouth daily., Disp: , Rfl:  .  atorvastatin (LIPITOR) 10 MG tablet, TAKE 1 TABLET EVERY EVENING., Disp: 90 tablet, Rfl: 2 .  B-D ULTRAFINE III SHORT PEN 31G X 8 MM MISC, USE DAILY WITH VICTOZA AND/OR NOVOLOG., Disp: 400 each, Rfl: 1 .  Blood Glucose Calibration (TRUE METRIX LEVEL 1) Low SOLN, Use as directed  Dx:e11.22, Disp: 1 each, Rfl: 2 .  Blood Glucose Monitoring Suppl (TRUE METRIX METER) w/Device KIT, Use as directed  to check blood sugars 2 times per day dx: e11.22, Disp: 1 kit, Rfl: 1 .  carvedilol (COREG) 25 MG tablet, TAKE 1 TABLET TWICE DAILY WITH A MEAL, Disp: 180 tablet, Rfl: 2 .  Cholecalciferol (VITAMIN D-3) 1000 units CAPS, Take 5,000 Units by mouth daily. , Disp: , Rfl:  .  dexlansoprazole (DEXILANT) 60 MG capsule, Take 1 capsule (60 mg total) by mouth daily., Disp: 90 capsule, Rfl: 2 .  diclofenac sodium (VOLTAREN) 1 % GEL, Apply 2 g topically 4 (four) times daily as needed (pain)., Disp: , Rfl:  .  ferrous sulfate 324 MG TBEC, Take 324 mg by mouth 2 (two) times daily., Disp: , Rfl:  .  furosemide (LASIX) 80 MG tablet, Take 1 tablet (80 mg total) by mouth daily. 34m daily Monday-Friday, 432mon Saturday and Sunday, Disp: 90 tablet, Rfl: 0 .  glucose blood (TRUE METRIX BLOOD GLUCOSE TEST) test strip, Use as directed to check blood sugars 2 times per day dx: e11.22, Disp: 300 each, Rfl: 2 .  hydrALAZINE (APRESOLINE) 100 MG tablet, Take 1 tablet (100 mg total) by mouth 3 (three) times daily., Disp: 270 tablet, Rfl: 3 .  hydrocortisone cream 1 %, Apply 1 application topically daily as needed for itching. , Disp: , Rfl:  .  insulin glargine, 2 Unit Dial, (TOUJEO MAX SOLOSTAR) 300 UNIT/ML Solostar Pen, Inject 25 Units into the skin at bedtime., Disp: , Rfl:  .  insulin lispro (HUMALOG KWIKPEN) 100 UNIT/ML KwikPen, Inject 0.02-0.04 mLs (2-4 Units total) into the skin 3 (three) times daily as needed (high blood sugar over 150)., Disp: 15 mL, Rfl: 2 .  Isopropyl Alcohol (ALCOHOL WIPES) 70 % MISC, Apply 1 each topically 2 (two) times daily., Disp: 300 each, Rfl: 3 .  isosorbide mononitrate (IMDUR) 30 MG 24 hr tablet, Take 1 tablet (30 mg total) by mouth daily., Disp: 30 tablet, Rfl: 1 .  lamoTRIgine (LAMICTAL) 150 MG tablet, Take 1 tablet (150 mg total) by mouth 2 (two) times daily., Disp: 180 tablet, Rfl: 3 .  levETIRAcetam (KEPPRA XR) 500 MG 24 hr tablet, Take 3 tablets every night, Disp: 270 tablet, Rfl: 3 .   Menthol, Topical Analgesic, (STOPAIN ROLL-ON EX), Apply 1 application topically daily as needed (pain)., Disp: , Rfl:  .  neomycin-bacitracin-polymyxin (NEOSPORIN) ointment, Apply 1 application topically as needed for wound care. , Disp: , Rfl:  .  ondansetron (ZOFRAN) 4 MG tablet, , Disp: , Rfl:  .  [place]  MG/DOSE, 4 MG/3ML SOPN, INJECT 1 MG INTO THE SKIN ONCE A WEEK., Disp: 9 mL, Rfl: 3 .  Pancrelipase, Lip-Prot-Amyl, (ZENPEP PO), Take by mouth., Disp: , Rfl:  .  Pancrelipase, Lip-Prot-Amyl, 25000-79000 units CPEP, Take 1-2 capsules by mouth See admin instructions. Take 2 capsules in the morning, 1 capsule with lunch, and 2 capsules in the evening., Disp: , Rfl:  .  potassium chloride SA (KLOR-CON) 20 MEQ tablet, TAKE 2 TABLETS EVERY DAY BUT WHEN TAKING METOLAZONE TAKE 3 TABLETS DAILY AS DIRECTED, Disp: 204 tablet, Rfl: 2 .  sennosides-docusate sodium (SENOKOT-S) 8.6-50 MG tablet, Take 1 tablet by mouth daily., Disp: , Rfl:  .  sertraline (ZOLOFT) 50 MG tablet, TAKE 1 TABLET EVERY DAY, Disp: 90 tablet, Rfl: 2 .  tolnaftate (TINACTIN) 1 % cream, Apply 1 application topically daily as needed (foot fungus). , Disp: , Rfl:  .  TRUEplus Lancets 33G MISC, Use as directed to check blood sugars 2 times per day dx: e11.22, Disp: 300 each, Rfl: 2   Allergies  Allergen Reactions  . Other Anaphylaxis and Rash    Bleach  . Penicillins Hives    Did it involve swelling of the face/tongue/throat, SOB, or low BP? No Did it involve sudden or severe rash/hives, skin peeling, or any reaction on the inside of your mouth or nose? Yes Did you need to seek medical attention at a hospital or doctor's office? Yes When did it last happen?Childhood allergy  If all above answers are "NO", may proceed with cephalosporin use.    . Sulfa Antibiotics Hives  . Aspirin Other (See Comments)     On aspirin 81 mg - Rectal bleeding in dec 2018  . Codeine     Headache and makes the patient feel "off"     Review  of Systems  HENT: Positive for congestion.   Respiratory: Positive for cough.      There were no vitals filed for this visit. There is no height or weight on file to calculate BMI.   Objective:  Physical Exam      Assessment And Plan:     1. Cough - POC COVID-19     Patient was given opportunity to ask questions. Patient verbalized understanding of the plan and was able to repeat key elements of the plan. All questions were answered to their satisfaction.  [name]   I, [name], have reviewed all documentation for this visit. The documentation on 11/10/20 for the exam, diagnosis, procedures, and orders are all accurate and complete.   IF YOU HAVE BEEN REFERRED TO A SPECIALIST, IT MAY TAKE 1-2 WEEKS TO SCHEDULE/PROCESS THE REFERRAL. IF YOU HAVE NOT HEARD FROM US/SPECIALIST IN TWO WEEKS, PLEASE GIVE [place] A CALL AT [phone].   THE PATIENT IS ENCOURAGED TO PRACTICE SOCIAL DISTANCING DUE TO THE COVID-19 PANDEMIC.

## 2020-11-10 NOTE — Patient Instructions (Signed)

## 2020-11-13 ENCOUNTER — Emergency Department (HOSPITAL_COMMUNITY): Payer: Medicare HMO

## 2020-11-13 ENCOUNTER — Inpatient Hospital Stay (HOSPITAL_COMMUNITY)
Admission: EM | Admit: 2020-11-13 | Discharge: 2020-11-18 | DRG: 190 | Disposition: A | Discharge status: Home / Self Care | Payer: Medicare HMO | Attending: Internal Medicine | Admitting: Internal Medicine

## 2020-11-13 ENCOUNTER — Encounter (HOSPITAL_COMMUNITY): Payer: Self-pay | Admitting: Emergency Medicine

## 2020-11-13 ENCOUNTER — Other Ambulatory Visit: Payer: Self-pay

## 2020-11-13 DIAGNOSIS — J9601 Acute respiratory failure with hypoxia: Secondary | ICD-10-CM | POA: Diagnosis present

## 2020-11-13 DIAGNOSIS — I5033 Acute on chronic diastolic (congestive) heart failure: Secondary | ICD-10-CM | POA: Diagnosis present

## 2020-11-13 DIAGNOSIS — F419 Anxiety disorder, unspecified: Secondary | ICD-10-CM | POA: Diagnosis present

## 2020-11-13 DIAGNOSIS — E1165 Type 2 diabetes mellitus with hyperglycemia: Secondary | ICD-10-CM | POA: Diagnosis present

## 2020-11-13 DIAGNOSIS — E871 Hypo-osmolality and hyponatremia: Secondary | ICD-10-CM | POA: Diagnosis present

## 2020-11-13 DIAGNOSIS — E785 Hyperlipidemia, unspecified: Secondary | ICD-10-CM | POA: Diagnosis present

## 2020-11-13 DIAGNOSIS — G8929 Other chronic pain: Secondary | ICD-10-CM | POA: Diagnosis present

## 2020-11-13 DIAGNOSIS — J209 Acute bronchitis, unspecified: Secondary | ICD-10-CM | POA: Diagnosis present

## 2020-11-13 DIAGNOSIS — Z90710 Acquired absence of both cervix and uterus: Secondary | ICD-10-CM

## 2020-11-13 DIAGNOSIS — Z882 Allergy status to sulfonamides status: Secondary | ICD-10-CM

## 2020-11-13 DIAGNOSIS — Z6838 Body mass index (BMI) 38.0-38.9, adult: Secondary | ICD-10-CM

## 2020-11-13 DIAGNOSIS — Z9049 Acquired absence of other specified parts of digestive tract: Secondary | ICD-10-CM

## 2020-11-13 DIAGNOSIS — Z20822 Contact with and (suspected) exposure to covid-19: Secondary | ICD-10-CM | POA: Diagnosis present

## 2020-11-13 DIAGNOSIS — G40909 Epilepsy, unspecified, not intractable, without status epilepticus: Secondary | ICD-10-CM | POA: Diagnosis present

## 2020-11-13 DIAGNOSIS — R0602 Shortness of breath: Secondary | ICD-10-CM | POA: Diagnosis present

## 2020-11-13 DIAGNOSIS — E876 Hypokalemia: Secondary | ICD-10-CM | POA: Diagnosis present

## 2020-11-13 DIAGNOSIS — E1142 Type 2 diabetes mellitus with diabetic polyneuropathy: Secondary | ICD-10-CM | POA: Diagnosis present

## 2020-11-13 DIAGNOSIS — R3915 Urgency of urination: Secondary | ICD-10-CM | POA: Diagnosis present

## 2020-11-13 DIAGNOSIS — Z79899 Other long term (current) drug therapy: Secondary | ICD-10-CM

## 2020-11-13 DIAGNOSIS — G43909 Migraine, unspecified, not intractable, without status migrainosus: Secondary | ICD-10-CM | POA: Diagnosis present

## 2020-11-13 DIAGNOSIS — F32A Depression, unspecified: Secondary | ICD-10-CM | POA: Diagnosis present

## 2020-11-13 DIAGNOSIS — Z86010 Personal history of colonic polyps: Secondary | ICD-10-CM

## 2020-11-13 DIAGNOSIS — I5032 Chronic diastolic (congestive) heart failure: Secondary | ICD-10-CM | POA: Diagnosis present

## 2020-11-13 DIAGNOSIS — I252 Old myocardial infarction: Secondary | ICD-10-CM

## 2020-11-13 DIAGNOSIS — I499 Cardiac arrhythmia, unspecified: Secondary | ICD-10-CM | POA: Diagnosis not present

## 2020-11-13 DIAGNOSIS — I130 Hypertensive heart and chronic kidney disease with heart failure and stage 1 through stage 4 chronic kidney disease, or unspecified chronic kidney disease: Secondary | ICD-10-CM | POA: Diagnosis present

## 2020-11-13 DIAGNOSIS — J439 Emphysema, unspecified: Principal | ICD-10-CM | POA: Diagnosis present

## 2020-11-13 DIAGNOSIS — R079 Chest pain, unspecified: Secondary | ICD-10-CM

## 2020-11-13 DIAGNOSIS — D509 Iron deficiency anemia, unspecified: Secondary | ICD-10-CM

## 2020-11-13 DIAGNOSIS — Z886 Allergy status to analgesic agent status: Secondary | ICD-10-CM

## 2020-11-13 DIAGNOSIS — J9811 Atelectasis: Secondary | ICD-10-CM | POA: Diagnosis not present

## 2020-11-13 DIAGNOSIS — Z833 Family history of diabetes mellitus: Secondary | ICD-10-CM

## 2020-11-13 DIAGNOSIS — I83813 Varicose veins of bilateral lower extremities with pain: Secondary | ICD-10-CM | POA: Diagnosis present

## 2020-11-13 DIAGNOSIS — K5900 Constipation, unspecified: Secondary | ICD-10-CM | POA: Diagnosis not present

## 2020-11-13 DIAGNOSIS — E662 Morbid (severe) obesity with alveolar hypoventilation: Secondary | ICD-10-CM | POA: Diagnosis present

## 2020-11-13 DIAGNOSIS — N182 Chronic kidney disease, stage 2 (mild): Secondary | ICD-10-CM | POA: Diagnosis present

## 2020-11-13 DIAGNOSIS — R0789 Other chest pain: Secondary | ICD-10-CM | POA: Diagnosis not present

## 2020-11-13 DIAGNOSIS — K219 Gastro-esophageal reflux disease without esophagitis: Secondary | ICD-10-CM | POA: Diagnosis present

## 2020-11-13 DIAGNOSIS — E1122 Type 2 diabetes mellitus with diabetic chronic kidney disease: Secondary | ICD-10-CM | POA: Diagnosis present

## 2020-11-13 DIAGNOSIS — Z83438 Family history of other disorder of lipoprotein metabolism and other lipidemia: Secondary | ICD-10-CM

## 2020-11-13 DIAGNOSIS — N183 Chronic kidney disease, stage 3 unspecified: Secondary | ICD-10-CM | POA: Diagnosis present

## 2020-11-13 DIAGNOSIS — K861 Other chronic pancreatitis: Secondary | ICD-10-CM | POA: Diagnosis present

## 2020-11-13 DIAGNOSIS — M159 Polyosteoarthritis, unspecified: Secondary | ICD-10-CM | POA: Diagnosis present

## 2020-11-13 DIAGNOSIS — Z20828 Contact with and (suspected) exposure to other viral communicable diseases: Secondary | ICD-10-CM | POA: Diagnosis not present

## 2020-11-13 DIAGNOSIS — Z87891 Personal history of nicotine dependence: Secondary | ICD-10-CM

## 2020-11-13 DIAGNOSIS — Z880 Allergy status to penicillin: Secondary | ICD-10-CM

## 2020-11-13 DIAGNOSIS — R059 Cough, unspecified: Secondary | ICD-10-CM | POA: Diagnosis not present

## 2020-11-13 DIAGNOSIS — H269 Unspecified cataract: Secondary | ICD-10-CM | POA: Diagnosis present

## 2020-11-13 DIAGNOSIS — R0609 Other forms of dyspnea: Secondary | ICD-10-CM | POA: Diagnosis not present

## 2020-11-13 DIAGNOSIS — Z8249 Family history of ischemic heart disease and other diseases of the circulatory system: Secondary | ICD-10-CM

## 2020-11-13 DIAGNOSIS — I272 Pulmonary hypertension, unspecified: Secondary | ICD-10-CM | POA: Diagnosis present

## 2020-11-13 DIAGNOSIS — I509 Heart failure, unspecified: Secondary | ICD-10-CM | POA: Diagnosis not present

## 2020-11-13 DIAGNOSIS — J984 Other disorders of lung: Secondary | ICD-10-CM | POA: Diagnosis not present

## 2020-11-13 DIAGNOSIS — R140 Abdominal distension (gaseous): Secondary | ICD-10-CM

## 2020-11-13 DIAGNOSIS — Z8616 Personal history of COVID-19: Secondary | ICD-10-CM | POA: Diagnosis not present

## 2020-11-13 DIAGNOSIS — I2510 Atherosclerotic heart disease of native coronary artery without angina pectoris: Secondary | ICD-10-CM | POA: Diagnosis present

## 2020-11-13 DIAGNOSIS — Z8701 Personal history of pneumonia (recurrent): Secondary | ICD-10-CM

## 2020-11-13 DIAGNOSIS — R053 Chronic cough: Secondary | ICD-10-CM | POA: Diagnosis present

## 2020-11-13 DIAGNOSIS — R0600 Dyspnea, unspecified: Secondary | ICD-10-CM | POA: Diagnosis not present

## 2020-11-13 DIAGNOSIS — Z91048 Other nonmedicinal substance allergy status: Secondary | ICD-10-CM

## 2020-11-13 DIAGNOSIS — R7989 Other specified abnormal findings of blood chemistry: Secondary | ICD-10-CM | POA: Diagnosis present

## 2020-11-13 DIAGNOSIS — G20 Parkinson's disease: Secondary | ICD-10-CM | POA: Diagnosis present

## 2020-11-13 DIAGNOSIS — R58 Hemorrhage, not elsewhere classified: Secondary | ICD-10-CM | POA: Diagnosis not present

## 2020-11-13 DIAGNOSIS — E119 Type 2 diabetes mellitus without complications: Secondary | ICD-10-CM | POA: Diagnosis present

## 2020-11-13 DIAGNOSIS — H409 Unspecified glaucoma: Secondary | ICD-10-CM | POA: Diagnosis present

## 2020-11-13 DIAGNOSIS — Z6841 Body Mass Index (BMI) 40.0 and over, adult: Secondary | ICD-10-CM

## 2020-11-13 DIAGNOSIS — Z794 Long term (current) use of insulin: Secondary | ICD-10-CM

## 2020-11-13 DIAGNOSIS — I517 Cardiomegaly: Secondary | ICD-10-CM | POA: Diagnosis not present

## 2020-11-13 DIAGNOSIS — J811 Chronic pulmonary edema: Secondary | ICD-10-CM | POA: Diagnosis not present

## 2020-11-13 DIAGNOSIS — I7789 Other specified disorders of arteries and arterioles: Secondary | ICD-10-CM | POA: Diagnosis not present

## 2020-11-13 LAB — CBC WITH DIFFERENTIAL/PLATELET
Abs Immature Granulocytes: 0.05 10*3/uL (ref 0.00–0.07)
Basophils Absolute: 0 10*3/uL (ref 0.0–0.1)
Basophils Relative: 0 %
Eosinophils Absolute: 0.1 10*3/uL (ref 0.0–0.5)
Eosinophils Relative: 1 %
HCT: 32.3 % — ABNORMAL LOW (ref 36.0–46.0)
Hemoglobin: 10.2 g/dL — ABNORMAL LOW (ref 12.0–15.0)
Immature Granulocytes: 1 %
Lymphocytes Relative: 8 %
Lymphs Abs: 0.7 10*3/uL (ref 0.7–4.0)
MCH: 22.8 pg — ABNORMAL LOW (ref 26.0–34.0)
MCHC: 31.6 g/dL (ref 30.0–36.0)
MCV: 72.3 fL — ABNORMAL LOW (ref 80.0–100.0)
Monocytes Absolute: 0.2 10*3/uL (ref 0.1–1.0)
Monocytes Relative: 2 %
Neutro Abs: 8.2 10*3/uL — ABNORMAL HIGH (ref 1.7–7.7)
Neutrophils Relative %: 88 %
Platelets: 266 10*3/uL (ref 150–400)
RBC: 4.47 MIL/uL (ref 3.87–5.11)
RDW: 17.2 % — ABNORMAL HIGH (ref 11.5–15.5)
WBC: 9.2 10*3/uL (ref 4.0–10.5)
nRBC: 0 % (ref 0.0–0.2)

## 2020-11-13 LAB — COMPREHENSIVE METABOLIC PANEL
ALT: 16 U/L (ref 0–44)
AST: 20 U/L (ref 15–41)
Albumin: 3.2 g/dL — ABNORMAL LOW (ref 3.5–5.0)
Alkaline Phosphatase: 98 U/L (ref 38–126)
Anion gap: 10 (ref 5–15)
BUN: 7 mg/dL — ABNORMAL LOW (ref 8–23)
CO2: 28 mmol/L (ref 22–32)
Calcium: 8.8 mg/dL — ABNORMAL LOW (ref 8.9–10.3)
Chloride: 98 mmol/L (ref 98–111)
Creatinine, Ser: 0.75 mg/dL (ref 0.44–1.00)
GFR, Estimated: >60 mL/min (ref >60)
Glucose, Bld: 200 mg/dL — ABNORMAL HIGH (ref 70–99)
Potassium: 3.4 mmol/L — ABNORMAL LOW (ref 3.5–5.1)
Sodium: 136 mmol/L (ref 135–145)
Total Bilirubin: 0.7 mg/dL (ref 0.3–1.2)
Total Protein: 7.8 g/dL (ref 6.5–8.1)

## 2020-11-13 LAB — RESP PANEL BY RT-PCR (FLU A&B, COVID) ARPGX2
Influenza A by PCR: NEGATIVE
Influenza B by PCR: NEGATIVE
SARS Coronavirus 2 by RT PCR: NEGATIVE

## 2020-11-13 LAB — BRAIN NATRIURETIC PEPTIDE: B Natriuretic Peptide: 164.4 pg/mL — ABNORMAL HIGH (ref 0.0–100.0)

## 2020-11-13 LAB — TROPONIN I (HIGH SENSITIVITY): Troponin I (High Sensitivity): 11 ng/L (ref <18)

## 2020-11-13 MED ORDER — ALBUTEROL SULFATE HFA 108 (90 BASE) MCG/ACT IN AERS
4.0000 | INHALATION_SPRAY | Freq: Once | RESPIRATORY_TRACT | Status: AC
  Administered 2020-11-13: 4 via RESPIRATORY_TRACT

## 2020-11-13 MED ORDER — ALBUTEROL SULFATE (2.5 MG/3ML) 0.083% IN NEBU
5.0000 mg | INHALATION_SOLUTION | Freq: Once | RESPIRATORY_TRACT | Status: AC
  Administered 2020-11-13: 5 mg via RESPIRATORY_TRACT
  Filled 2020-11-13: qty 6

## 2020-11-13 NOTE — ED Provider Notes (Signed)
[facility] EMERGENCY DEPARTMENT Provider Note   CSN: [id number] Arrival date & time: 11/13/20  2105     History Chief Complaint  Patient presents with  . Chest Pain    [name] is a 75 y.o. female.  HPI 75 year old female presents with cough and shortness of breath and chest pain.  Cough has been for a little over a week.  She is had 2 negative COVID test.  She has been having productive cough with both clear and yellow and green sputum.  No fevers.  Over the last couple days has had chest pressure.  Kind of feels like when she had CHF though she is not having any swelling.  EMS gave a nebulizer treatment which has partially helped.  Past Medical History:  Diagnosis Date  . Abnormal liver function     in the past.  . Anemia   . Arthritis    all over  . Bruises easily   . Cataract    right eye;immature  . CHF (congestive heart failure) ([place])   . Chronic back pain    stenosis  . Chronic cough   . Chronic kidney disease   . COPD (chronic obstructive pulmonary disease) ([place])   . COVID    aug/sept 2021  . Demand myocardial infarction [facility]) 2012   Demand Infarction in setting of Pancreatitis --> mild Troponin elevation, NON-OBSTRUCTIVE CAD  . Depression    takes Abilify daily as well as Zoloft  . Diastolic heart failure 8329   Grade 1 diastolic Dysfunction by Echo   . Diverticulosis   . DM (diabetes mellitus) ([place])    takes Victoza daily as well as Lantus and Humalog  . Empty sella ([place])    on MRI in 2009.  . Glaucoma   . Headache(784.0)    last migraine-4-98yrs ago  . History of blood transfusion    no abnormal reaction noted  . History of colon polyps    benign  . HTN (hypertension)    takes [employer] daily  . Hyperlipidemia    takes Lipitor daily  . Joint swelling   . Nocturia   . Obesity hypoventilation syndrome ([place])   . Obstructive sleep apnea 02/2018   Notably improved split-night study with weight loss from 270  (2017) down to 250 pounds (2019).  . Pancreatitis    takes Pancrelipase daily  . Parkinson's disease ([place])    takes Sinemet daily  . Peripheral neuropathy   . Pneumonia 2012  . Seizures ([place])    takes Lamictal daily and Primidone nightly;last seizure 2wks ago  . Urinary urgency    With increased frequency  . Varicose veins of both lower extremities with pain    With edema.  Takes daily Lasix    Patient Active Problem List   Diagnosis Date Noted  . Postinflammatory pulmonary fibrosis ([place]) 06/08/2020  . Lung nodule 06/08/2020  . Chronic kidney disease, stage 2 (mild) 01/05/2020  . Hypertensive chronic kidney disease with stage 1 through stage 4 chronic kidney disease, or unspecified chronic kidney disease 01/05/2020  . Snoring 01/05/2020  . Neuroleptic-induced parkinsonism ([place]) 09/28/2019  . Class 3 severe obesity due to excess calories with serious comorbidity and body mass index (BMI) of 40.0 to 44.9 in adult [facility]) 05/22/2019  . Pulmonary hypertension, unspecified ([place]) 04/06/2019  . Syncope 02/24/2019  . Rheumatoid arthritis ([place]) 01/21/2019  . Radial styloid tenosynovitis 11/24/2018  . Type 2 diabetes mellitus with stage 2 chronic kidney disease, with long-term  current use of insulin ([place]) 07/21/2018  . Chest pain 05/15/2018  . 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 Excessive daytime sleepiness 01/30/2018  . Sleep-related hypoventilation 01/30/2018  . Recurrent depression ([place]) 11/06/2017  . OSA on CPAP 10/07/2017  . Todd's paralysis ([place])   . Chronic diastolic CHF (congestive heart failure), NYHA class 2 ([place])   . Chronic respiratory failure with hypoxia ([place])   . Acute lower GI bleeding   . Stroke-like symptoms 06/15/2017  . Rectal bleeding 06/15/2017  . Dehydration 03/07/2017  . Medication management 12/24/2016  . Near syncope 03/09/2016  . Racing heart beat 03/09/2016  . Dyspnea 01/28/2016  . Spinal  stenosis at L4-L5 level 10/04/2014  . Parkinsonism ([place]) 06/24/2013  . Lower abdominal pain 10/31/2012  . Type II diabetes mellitus, uncontrolled ([place]) 10/31/2012  . Seizure disorder ([place]) 10/31/2012  . Diverticulosis 10/31/2012  . Anemia associated with acute blood loss 08/14/2012  . Hematochezia 08/14/2012  . Varicose veins of bilateral lower extremities with other complications 93/57/0177  . Leg pain 03/21/2012  . Radicular pain of left lower extremity 03/21/2012  . Heart murmur 11/21/2011  . Infected pseudocyst of pancreas 10/28/2011  . Chronic pancreatitis ([place]) 10/28/2011  . Hypertensive heart disease with chronic diastolic congestive heart failure ([place]) 10/28/2011  . Dyslipidemia, goal LDL below 100 10/28/2011  . Morbid obesity with BMI of 45.0-49.9, adult ([place]) 10/28/2011  . Pseudoseizure 10/24/2011  . Gastroesophageal reflux 10/23/2011  . History of tobacco abuse 10/23/2011  . Hypoxemia 10/23/2011  . Disturbance in sleep behavior 09/03/2011  . Partial epilepsy with impairment of consciousness ([place]) 09/03/2011  . Tremor 09/03/2011  . Peripheral edema 04/14/2011  . Obesity hypoventilation syndrome ([place]) 10/26/2010  . Atherosclerotic heart disease of native coronary artery with other forms of angina pectoris ([place]) 08/28/2010  . Acute, but ill-defined, cerebrovascular disease 05/04/2008  . Arthropathy 05/04/2008  . Disequilibrium 05/04/2008  . Generalized tonic clonic epilepsy ([place]) 05/04/2008  . Increased frequency of urination 05/04/2008  . Insomnia, unspecified 05/04/2008  . Major depressive disorder, single episode, unspecified 05/04/2008  . Memory loss 05/04/2008  . Nausea 05/04/2008  . Other malaise and fatigue 05/04/2008  . Swelling of limb 05/04/2008    Past Surgical History:  Procedure Laterality Date  . ABDOMINAL HYSTERECTOMY    . APPENDECTOMY    . BACK SURGERY    . Cardiac Event Monitor  September-October 2017   Sinus rhythm with occasional PACs and  artifact. No arrhythmias besides one short run of tachycardia.  . CHOLECYSTECTOMY    . COLONOSCOPY N/A 08/15/2012   Procedure: COLONOSCOPY;  Surgeon: Arta Silence, MD;  Location: [facility];  Service: Endoscopy;  Laterality: N/A;  . COLONOSCOPY WITH PROPOFOL Left 06/17/2017   Procedure: COLONOSCOPY WITH PROPOFOL;  Surgeon: [name], MD;  Location: [place];  Service: Gastroenterology;  Laterality: Left;  . CORONARY CALCIUM SCORE AND CTA  04/2018   Coronary calcium score 71.9.  Very large, hyperdynamic LAD wrapping apex giving rise to PDA.  Moderate LAD-diagonal and circumflex plaque -> CT FFR suggested positive findings in distal D1, D2 and distal circumflex.  Referred for cath. ==> FALSE POSITIVE  . ESOPHAGOGASTRODUODENOSCOPY    . eye cysts Bilateral   . LASIK    . LEFT HEART CATH AND CORONARY ANGIOGRAPHY N/A 05/16/2018   Procedure: LEFT HEART CATH AND CORONARY ANGIOGRAPHY;  Surgeon: Belva Crome, MD;  Location: [facility] INVASIVE CV LAB:  Angiographically normal coronary arteries with LVEDP  of 21 mmHg.  Large draping hyperdominant LAD that wraps the apex and provides distal half of the PDA.  Relatively small caliber distal Cx -->proxLPDA, non-dom RCA. No Cx or Diag lesions -- FALSE + CT FFR  . LEFT HEART CATH AND CORONARY ANGIOGRAPHY  2009/March 2012   2009: (Dr. Alvie Heidelberg) Nonobstructive CAD; 2012: Minimal CAD --> false positive stress test  . NM MYOVIEW LTD  01/2016; 01/2018   a)LOW RISK. No ischemia or infarction;; b) EF 55-60%. NO ST changes.  No ischemia or Infarction. NO significant RV enlargement.  LOW RISK.  Marland Kitchen Polysomnogram  02/2018   (Dr. Brett Fairy from neurology): Split study was not adequately done due to low AHI.  She slept reclined and had an AHI less than 10, prolonged hypoxemia and no hypercapnia. -->  She has home oxygen already prescribed, but did not meet criteria for nightly oxygen.  (Was noted that the patient did not cooperate well with study.  Titration was discussed.  Marland Kitchen  PRESSURE SENSOR/CARDIOMEMS N/A 11/09/2019   Procedure: PRESSURE SENSOR/CARDIOMEMS;  Surgeon: Larey Dresser, MD;  Location: [place] CV LAB;  Service: Cardiovascular;  Laterality: N/A;  . RECTAL POLYPECTOMY    . RIGHT/LEFT HEART CATH AND CORONARY ANGIOGRAPHY N/A 04/06/2019   Procedure: RIGHT/LEFT HEART CATH AND CORONARY ANGIOGRAPHY;  Surgeon: Leonie Man, MD;  Location: [facility] INVASIVE CV LAB; angiographically normal coronary arteries.  Moderately elevated AD LVEDP..  Mild pulmonary hypertension: PA P 56/14 mmHg-mean 31 mmHg.  RAP 10 mmHg.  RV P-EDP 54/4 mmHg - 12 mmHg.  PCWP 11-15 mmHg.  CO-CI: 7.35-3.73.  . TONSILLECTOMY    . TRANSTHORACIC ECHOCARDIOGRAM  01/2016; 05/2017   A) Normal LV chamber size with mod LVH pattern. EF 50-55%. Severe LA dilation. Mod RA dilation. PAP elevated at 38 mmHg;; B) Mild concentric LVH.  EF 55-60% & no RWMA.  Grade 2 DD.  Diastolic flattening of the ventricular septum == ? elevated PAP.  Mild aortic valve calcification/sclerosis.  Mod LA dilation.  Mild RV dilation & Mod RA dilation  . TRANSTHORACIC ECHOCARDIOGRAM  10/2017; 01/2018   a) EF 55-60%.  No RWMA,  Moderate LA dilation.  Mild LA dilation.  Peak PA pressures ~57 mmHg (moderate);;; b) EF 55-60%.  GRII DD.  Suggestion of RV volume overload with moderate RV dilation.  Severe LA and RA dilation.Marland Kitchen  PA pressure ~67%.  . TRANSTHORACIC ECHOCARDIOGRAM  01/2019   Normal LV size and function.  EF 50 to 55%.  Impaired relaxation.  RV appears to have moderately reduced function.  Severely enlarged.  Cannot fully assess RV pressures.  Hypermobile interatrial septum.  Right atrium severely dilated.  . TUBAL LIGATION    . vaginal cyst removed     several times     OB History   No obstetric history on file.     Family History  Problem Relation Age of Onset  . Allergies Father   . Heart disease Father        before 20  . Heart failure Father   . Hypertension Father   . Hyperlipidemia Father   . Heart disease  Mother   . Diabetes Mother   . Hypertension Mother   . Hyperlipidemia Mother   . Hypertension Sister   . Heart disease Sister        before 58  . Hyperlipidemia Sister   . Diabetes Brother   . Hypertension Brother   . Diabetes Sister   . Hypertension Sister   . Diabetes Son   .  Hypertension Son   . Cancer Other     Social History   Tobacco Use  . Smoking status: Former Smoker    Packs/day: 0.50    Years: 25.00    Pack years: 12.50    Types: Cigarettes  . Smokeless tobacco: Never Used  . Tobacco comment: quit smoking 20+ytrs ago  Vaping Use  . Vaping Use: Never used  Substance Use Topics  . Alcohol use: No  . Drug use: No    Home Medications Prior to Admission medications   Medication Sig Start Date End Date Taking? Authorizing Provider  acetaminophen (TYLENOL) 500 MG tablet Take 500-1,000 mg by mouth every 4 (four) hours as needed for moderate pain.     [provider]  albuterol (VENTOLIN HFA) 108 (90 Base) MCG/ACT inhaler Inhale 1-2 puffs into the lungs every 6 (six) hours as needed for wheezing or shortness of breath.    [provider]  Alcohol Swabs (B-D SINGLE USE SWABS REGULAR) PADS USE TWICE DAILY AS DIRECTED 08/31/20   [name], MD  Ascorbic Acid (VITAMIN C WITH ROSE HIPS) 1000 MG tablet Take 1,000 mg by mouth daily.    [provider]  atorvastatin (LIPITOR) 10 MG tablet TAKE 1 TABLET EVERY EVENING. 06/22/20   [name], MD  azithromycin (ZITHROMAX Z-PAK) 250 MG tablet Take 2 tablets (500 mg) on  Day 1,  followed by 1 tablet (250 mg) once daily on Days 2 through 5. 11/10/20 11/15/20  Ghumman, Ramandeep, NP  B-D ULTRAFINE III SHORT PEN 31G X 8 MM MISC USE DAILY WITH VICTOZA AND/OR NOVOLOG. 07/08/20   [name], MD  benzonatate (TESSALON PERLES) 100 MG capsule Take 1 capsule (100 mg total) by mouth 3 (three) times daily as needed for cough. 11/10/20 11/10/21  Bary Castilla, NP  Blood Glucose Calibration (TRUE METRIX LEVEL  1) Low SOLN Use as directed  Dx:e11.22 03/09/20   [name], MD  Blood Glucose Monitoring Suppl (TRUE METRIX METER) w/Device KIT Use as directed to check blood sugars 2 times per day dx: e11.22 03/07/20   [name], MD  carvedilol (COREG) 25 MG tablet TAKE 1 TABLET TWICE DAILY WITH A MEAL 08/16/20   Leonie Man, MD  Cholecalciferol (VITAMIN D-3) 1000 units CAPS Take 5,000 Units by mouth daily.     [provider]  dexlansoprazole (DEXILANT) 60 MG capsule Take 1 capsule (60 mg total) by mouth daily. 11/08/20   [name], MD  diclofenac sodium (VOLTAREN) 1 % GEL Apply 2 g topically 4 (four) times daily as needed (pain). 05/17/18   Geradine Girt, DO  ferrous sulfate 324 MG TBEC Take 324 mg by mouth 2 (two) times daily.    [provider]  furosemide (LASIX) 80 MG tablet Take 1 tablet (80 mg total) by mouth daily. 12m daily Monday-Friday, 461mon Saturday and Sunday 08/16/20   McLarey DresserMD  glucose blood (TRUE METRIX BLOOD GLUCOSE TEST) test strip Use as directed to check blood sugars 2 times per day dx: e11.22 03/22/20   SaGlendale ChardMD  hydrALAZINE (APRESOLINE) 100 MG tablet Take 1 tablet (100 mg total) by mouth 3 (three) times daily. 10/21/20   McLarey DresserMD  hydrocortisone cream 1 % Apply 1 application topically daily as needed for itching.     [provider]  insulin glargine, 2 Unit Dial, (TOUJEO MAX SOLOSTAR) 300 UNIT/ML Solostar Pen Inject 25 Units into the skin at bedtime.    [provider]  insulin lispro (HUMALOG KWIKPEN) 100 UNIT/ML KwikPen Inject 0.02-0.04 mLs (2-4 Units total) into the skin 3 (three) times daily as needed (high blood sugar over 150). 09/14/19   [name], MD  Isopropyl Alcohol (ALCOHOL WIPES) 70 % MISC Apply 1 each topically 2 (two) times daily. 06/25/19   Minette Brine, FNP  isosorbide mononitrate (IMDUR) 30 MG 24 hr tablet Take 1 tablet (30 mg total) by mouth daily. 11/01/20 11/01/21  Leonie Man, MD  lamoTRIgine (LAMICTAL) 150 MG tablet Take 1 tablet (150 mg total) by mouth 2 (two) times daily. 09/11/19   Cameron Sprang, MD  levETIRAcetam (KEPPRA XR) 500 MG 24 hr tablet Take 3 tablets every night 10/26/20   Cameron Sprang, MD  Menthol, Topical Analgesic, (STOPAIN ROLL-ON EX) Apply 1 application topically daily as needed (pain).    [provider]  neomycin-bacitracin-polymyxin (NEOSPORIN) ointment Apply 1 application topically as needed for wound care.     [provider]  ondansetron (ZOFRAN) 4 MG tablet  02/22/20   [provider]  OZEMPIC, 1 MG/DOSE, 4 MG/3ML SOPN INJECT 1 MG INTO THE SKIN ONCE A WEEK. 11/08/20   [name], MD  Pancrelipase, Lip-Prot-Amyl, (ZENPEP PO) Take by mouth.    [provider]  Pancrelipase, Lip-Prot-Amyl, 25000-79000 units CPEP Take 1-2 capsules by mouth See admin instructions. Take 2 capsules in the morning, 1 capsule with lunch, and 2 capsules in the evening.    [provider]  potassium chloride SA (KLOR-CON) 20 MEQ tablet TAKE 2 TABLETS EVERY DAY BUT WHEN TAKING METOLAZONE TAKE 3 TABLETS DAILY AS DIRECTED 08/31/20   Leonie Man, MD  sennosides-docusate sodium (SENOKOT-S) 8.6-50 MG tablet Take 1 tablet by mouth daily.    [provider]  sertraline (ZOLOFT) 50 MG tablet TAKE 1 TABLET EVERY DAY 08/11/20   [name], MD  tolnaftate (TINACTIN) 1 % cream Apply 1 application topically daily as needed (foot fungus).     [provider]  TRUEplus Lancets 33G MISC Use as directed to check blood sugars 2 times per day dx: e11.22 03/22/20   [name], MD    Allergies    Other, Penicillins, Sulfa antibiotics, Aspirin, and Codeine  Review of Systems   Review of Systems  Constitutional: Negative for fever.  Respiratory: Positive for cough and shortness of breath.   Cardiovascular: Positive for chest pain. Negative for leg swelling.  All other systems reviewed and are  negative.   Physical Exam Updated Vital Signs BP 123/72   Pulse 87   Temp 98.8 F (37.1 C) (Oral)   Resp (!) 23   SpO2 91%   Physical Exam Vitals and nursing note reviewed.  Constitutional:      General: She is not in acute distress.    Appearance: She is well-developed. She is not ill-appearing or diaphoretic.  HENT:     Head: Normocephalic and atraumatic.     Right Ear: External ear normal.     Left Ear: External ear normal.     Nose: Nose normal.  Eyes:     General:        Right eye: No discharge.        Left eye: No discharge.  Cardiovascular:     Rate and Rhythm: Normal rate and regular rhythm.     Heart sounds: Normal heart sounds.  Pulmonary:     Effort: Pulmonary effort is normal. Tachypnea present. No accessory muscle usage or respiratory distress.     Breath  sounds: Decreased breath sounds and rales present.  Abdominal:     Palpations: Abdomen is soft.     Tenderness: There is no abdominal tenderness.  Musculoskeletal:     Right lower leg: No edema.     Left lower leg: No edema.  Skin:    General: Skin is warm and dry.  Neurological:     Mental Status: She is alert.  Psychiatric:        Mood and Affect: Mood is not anxious.     ED Results / Procedures / Treatments   Labs (all labs ordered are listed, but only abnormal results are displayed) Labs Reviewed  RESP PANEL BY RT-PCR (FLU A&B, COVID) [id number]  COMPREHENSIVE METABOLIC PANEL  BRAIN NATRIURETIC PEPTIDE  CBC WITH DIFFERENTIAL/PLATELET  TROPONIN I (HIGH SENSITIVITY)    EKG EKG Interpretation  Date/Time:  Sunday Nov 13 2020 21:17:06 EDT Ventricular Rate:  93 PR Interval:    QRS Duration: 101 QT Interval:  388 QTC Calculation: 483 R Axis:   106 Text Interpretation: Atrial fibrillation Right axis deviation Confirmed by Sherwood Gambler [phone]) on 11/13/2020 9:23:08 PM   Radiology DG Chest Portable 1 View  Result Date: 11/13/2020 CLINICAL DATA:  Cough EXAM: PORTABLE CHEST 1 VIEW  COMPARISON:  03/09/2020 FINDINGS: Lung volumes are small and there is bibasilar atelectasis. No confluent pulmonary infiltrate. No pneumothorax or pleural effusion. Mild cardiomegaly is stable. Enlargement of the central pulmonary arteries is again identified, similar to prior examination and better assessed on prior CT examination of 03/29/2020. No superimposed overt pulmonary edema. No acute bone abnormality. IMPRESSION: Pulmonary hypoinflation Stable cardiomegaly Stable enlargement of the central pulmonary arteries. Electronically Signed   By: Fidela Salisbury MD   On: 11/13/2020 22:42    Procedures Procedures   Medications Ordered in ED Medications  albuterol (PROVENTIL) (2.5 MG/3ML) 0.083% nebulizer solution 5 mg (has no administration in time range)  albuterol (VENTOLIN HFA) 108 (90 Base) MCG/ACT inhaler 4 puff (4 puffs Inhalation Given 11/13/20 2235)    ED Course  I have reviewed the triage vital signs and the nursing notes.  Pertinent labs & imaging results that were available during my care of the patient were reviewed by me and considered in my medical decision making (see chart for details).    MDM Rules/Calculators/A&P                          Patient is nondistressed though has some borderline O2 sats in the low 90s.  She has diminished and probably this is bronchitis/COPD.  Question of this could be CHF with some rales component.  Will give albuterol here given negative COVID testing.  Unfortunately labs are still pending.  Care transferred to Dr. Roxanne Mins. Final Clinical Impression(s) / ED Diagnoses Final diagnoses:  None    Rx / DC Orders ED Discharge Orders    None       Sherwood Gambler, MD 11/13/20 2312

## 2020-11-13 NOTE — ED Triage Notes (Signed)
Pt BIB [facility] from home, c/o chest pain x 3 days and a cough x 1 week. Tested for covid with negative result. Given 3 NTG pta with some relief. Also reports shortness of breath, given 10mg  albuterol, 0.5mg  atrovent and 125mg  solumedrol.

## 2020-11-13 NOTE — ED Provider Notes (Signed)
Care assumed from Dr. Regenia Skeeter, patient with chest pain and shortness of breath, labs pending.  Respiratory status did improve following nebulizer treatment.  She is COVID-negative and influenza negative.  Labs show stable anemia, normal troponin, borderline hypokalemia and she is given a dose of oral potassium.  BNP is mildly elevated, but she shows no other clinical findings suggestive of heart failure.  She has received additional albuterol in the ED, but states she still feels dyspneic.  Oxygen saturation is noted to vacillate between the mid 80s and mid 90s.  She is not felt to be stable to be discharged given her persistent dyspnea and hypoxia.  Case is discussed with Dr. Hal Hope of [facility], who agrees to admit the patient.  Results for orders placed or performed during the hospital encounter of 11/13/20  Resp Panel by RT-PCR (Flu A&B, Covid) Nasopharyngeal Swab   Specimen: Nasopharyngeal Swab; Nasopharyngeal(NP) swabs in vial transport medium  Result Value Ref Range   SARS Coronavirus 2 by RT PCR NEGATIVE NEGATIVE   Influenza A by PCR NEGATIVE NEGATIVE   Influenza B by PCR NEGATIVE NEGATIVE  Comprehensive metabolic panel  Result Value Ref Range   Sodium 136 135 - 145 mmol/L   Potassium 3.4 (L) 3.5 - 5.1 mmol/L   Chloride 98 98 - 111 mmol/L   CO2 28 22 - 32 mmol/L   Glucose, Bld 200 (H) 70 - 99 mg/dL   BUN 7 (L) 8 - 23 mg/dL   Creatinine, Ser 0.75 0.44 - 1.00 mg/dL   Calcium 8.8 (L) 8.9 - 10.3 mg/dL   Total Protein 7.8 6.5 - 8.1 g/dL   Albumin 3.2 (L) 3.5 - 5.0 g/dL   AST 20 15 - 41 U/L   ALT 16 0 - 44 U/L   Alkaline Phosphatase 98 38 - 126 U/L   Total Bilirubin 0.7 0.3 - 1.2 mg/dL   GFR, Estimated >60 >60 mL/min   Anion gap 10 5 - 15  Brain natriuretic peptide  Result Value Ref Range   B Natriuretic Peptide 164.4 (H) 0.0 - 100.0 pg/mL  CBC with Differential  Result Value Ref Range   WBC 9.2 4.0 - 10.5 K/uL   RBC 4.47 3.87 - 5.11 MIL/uL   Hemoglobin 10.2 (L)  12.0 - 15.0 g/dL   HCT 32.3 (L) 36.0 - 46.0 %   MCV 72.3 (L) 80.0 - 100.0 fL   MCH 22.8 (L) 26.0 - 34.0 pg   MCHC 31.6 30.0 - 36.0 g/dL   RDW 17.2 (H) 11.5 - 15.5 %   Platelets 266 150 - 400 K/uL   nRBC 0.0 0.0 - 0.2 %   Neutrophils Relative % 88 %   Neutro Abs 8.2 (H) 1.7 - 7.7 K/uL   Lymphocytes Relative 8 %   Lymphs Abs 0.7 0.7 - 4.0 K/uL   Monocytes Relative 2 %   Monocytes Absolute 0.2 0.1 - 1.0 K/uL   Eosinophils Relative 1 %   Eosinophils Absolute 0.1 0.0 - 0.5 K/uL   Basophils Relative 0 %   Basophils Absolute 0.0 0.0 - 0.1 K/uL   Immature Granulocytes 1 %   Abs Immature Granulocytes 0.05 0.00 - 0.07 K/uL  Troponin I (High Sensitivity)  Result Value Ref Range   Troponin I (High Sensitivity) 11 <18 ng/L  Troponin I (High Sensitivity)  Result Value Ref Range   Troponin I (High Sensitivity) 9 <18 ng/L   *Note: Due to a large number of results and/or encounters for the requested time period,  some results have not been displayed. A complete set of results can be found in Results Review.   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 Laser settings: Lumenis Smart532 laser, slit lamp Lens: Mainster PRP 165 Power: 260 mW Spot size: 200 microns Duration: 30 msec # spots: 627 Placement of laser: Using a Mainster PRP 165 contact lens at the slit lamp, laser was placed in three confluent rows around patches of pigmented lattice at 0430 and 0730 oclock anterior to equator with additional rows anteriorly + posterior to peripheral schisis in IT  quadrant. Complications: None. Patient tolerated the procedure well and received written and verbal post-procedure care information/education.   DG Chest Portable 1 View  Result Date: 11/13/2020 CLINICAL DATA:  Cough EXAM: PORTABLE CHEST 1 VIEW COMPARISON:  03/09/2020 FINDINGS: Lung volumes are small and there is bibasilar atelectasis. No confluent pulmonary infiltrate. No pneumothorax or pleural effusion. Mild cardiomegaly is stable. Enlargement of the central pulmonary arteries is again identified, similar to prior examination and better assessed on prior CT examination of 03/29/2020. No superimposed overt pulmonary edema. No acute bone abnormality. IMPRESSION: Pulmonary hypoinflation Stable cardiomegaly Stable enlargement of the central pulmonary arteries. Electronically Signed   By: Fidela Salisbury MD   On: 11/13/2020 22:42   OCT, Retina - OU - Both Eyes  Result Date: 10/28/2020 Right Eye Quality was borderline. Central Foveal Thickness: 320. Progression has worsened. Findings include abnormal foveal contour, no IRF, no SRF (Foveal notch; interval development of vitreous opacities; peripheral schisis and focal SRF with retinal break caught on widefield -- not imaged today). Left Eye Quality was borderline. Central Foveal Thickness: 256. Progression has been stable. Findings include normal foveal contour, no IRF, no SRF (Rare drusen). Notes *Images captured and stored on drive Diagnosis / Impression: OD: Foveal notch; interval development of vitreous opacities; peripheral schisis and focal SRF with retinal break caught on widefield -- not imaged today OS: NFP, no IRF/SRF Clinical management: See below Abbreviations: NFP - Normal foveal profile. CME - cystoid macular edema. PED - pigment epithelial detachment. IRF - intraretinal fluid. SRF - subretinal fluid. EZ - ellipsoid zone. ERM - epiretinal membrane. ORA - outer retinal atrophy. ORT - outer retinal tubulation. SRHM - subretinal hyper-reflective  material. IRHM - intraretinal hyper-reflective material   OCT, Retina - OU - Both Eyes  Result Date: 10/26/2020 Right Eye Quality was good. Central Foveal Thickness: 240. Progression has no prior data. Findings include abnormal foveal contour, no IRF, no SRF (Foveal notch, peripheral schisis and focal SRF with retinal break caught on widefield). Left Eye Quality was good. Central Foveal Thickness: 246. Progression has no prior data. Findings include normal foveal contour, no IRF, no SRF (Rare drusen). Notes *Images captured and stored on drive Diagnosis / Impression: OD: Foveal notch, peripheral schisis and focal SRF with retinal break caught on widefield -- IT quadrant OS: NFP, no IRF/SRF Clinical management: See below Abbreviations: NFP - Normal foveal profile. CME - cystoid macular edema. PED -  pigment epithelial detachment. IRF - intraretinal fluid. SRF - subretinal fluid. EZ - ellipsoid zone. ERM - epiretinal membrane. ORA - outer retinal atrophy. ORT - outer retinal tubulation. SRHM - subretinal hyper-reflective material. IRHM - intraretinal hyper-reflective material   Repair Retinal Breaks, Laser - OS - Left Eye  Result Date: 10/28/2020 Time Out Confirmed correct patient, procedure, site, and patient consented. Anesthesia Topical anesthesia was used. Anesthetic medications included Proparacaine 0.5%. Post-op The patient tolerated the procedure well. There were no complications. The patient received written and verbal post procedure care education. Notes LASER PROCEDURE NOTE Procedure:  Barrier laser retinopexy using slit lamp laser, LEFT eye Diagnosis:   Lattice degeneration w/ atrophic holes, LEFT eye                     Focal patches of lattice at 0430 and 0500 Surgeon: Bernarda Caffey, MD, PhD Anesthesia: Topical Informed consent obtained, operative eye marked, and time out performed prior to initiation of laser. Laser settings: Lumenis Smart532 laser, slit lamp Lens: Mainster PRP 165 Power: 250 mW Spot  size: 200 microns Duration: 30 msec # spots: 346 Placement of laser: Using a Mainster PRP 165 contact lens at the slit lamp, laser was placed in three confluent rows around patches of lattice w/ atrophic holes at 430 and 500 oclock anterior to equator with additional rows anteriorly. Complications: None. Patient tolerated the procedure well and received written and verbal post-procedure care information/education.   Images viewed by me.    Delora Fuel, MD 74/25/95 [phone]

## 2020-11-14 ENCOUNTER — Encounter (HOSPITAL_COMMUNITY): Payer: Self-pay | Admitting: Internal Medicine

## 2020-11-14 ENCOUNTER — Telehealth: Payer: Self-pay

## 2020-11-14 ENCOUNTER — Observation Stay (HOSPITAL_COMMUNITY): Payer: Medicare HMO

## 2020-11-14 DIAGNOSIS — Z6841 Body Mass Index (BMI) 40.0 and over, adult: Secondary | ICD-10-CM

## 2020-11-14 DIAGNOSIS — E662 Morbid (severe) obesity with alveolar hypoventilation: Secondary | ICD-10-CM

## 2020-11-14 DIAGNOSIS — R0602 Shortness of breath: Secondary | ICD-10-CM | POA: Diagnosis not present

## 2020-11-14 DIAGNOSIS — E1165 Type 2 diabetes mellitus with hyperglycemia: Secondary | ICD-10-CM

## 2020-11-14 DIAGNOSIS — I272 Pulmonary hypertension, unspecified: Secondary | ICD-10-CM

## 2020-11-14 DIAGNOSIS — I5032 Chronic diastolic (congestive) heart failure: Secondary | ICD-10-CM

## 2020-11-14 DIAGNOSIS — K861 Other chronic pancreatitis: Secondary | ICD-10-CM

## 2020-11-14 DIAGNOSIS — N182 Chronic kidney disease, stage 2 (mild): Secondary | ICD-10-CM

## 2020-11-14 DIAGNOSIS — G40909 Epilepsy, unspecified, not intractable, without status epilepticus: Secondary | ICD-10-CM

## 2020-11-14 LAB — BASIC METABOLIC PANEL
Anion gap: 10 (ref 5–15)
BUN: 8 mg/dL (ref 8–23)
CO2: 29 mmol/L (ref 22–32)
Calcium: 9.3 mg/dL (ref 8.9–10.3)
Chloride: 95 mmol/L — ABNORMAL LOW (ref 98–111)
Creatinine, Ser: 0.71 mg/dL (ref 0.44–1.00)
GFR, Estimated: >60 mL/min (ref >60)
Glucose, Bld: 247 mg/dL — ABNORMAL HIGH (ref 70–99)
Potassium: 3.7 mmol/L (ref 3.5–5.1)
Sodium: 134 mmol/L — ABNORMAL LOW (ref 135–145)

## 2020-11-14 LAB — CBC
HCT: 34.8 % — ABNORMAL LOW (ref 36.0–46.0)
Hemoglobin: 11 g/dL — ABNORMAL LOW (ref 12.0–15.0)
MCH: 22.8 pg — ABNORMAL LOW (ref 26.0–34.0)
MCHC: 31.6 g/dL (ref 30.0–36.0)
MCV: 72.2 fL — ABNORMAL LOW (ref 80.0–100.0)
Platelets: 331 10*3/uL (ref 150–400)
RBC: 4.82 MIL/uL (ref 3.87–5.11)
RDW: 17.2 % — ABNORMAL HIGH (ref 11.5–15.5)
WBC: 8.7 10*3/uL (ref 4.0–10.5)
nRBC: 0 % (ref 0.0–0.2)

## 2020-11-14 LAB — RESPIRATORY PANEL BY PCR

## 2020-11-14 LAB — GLUCOSE, CAPILLARY
Glucose-Capillary: 263 mg/dL — ABNORMAL HIGH (ref 70–99)
Glucose-Capillary: 253 mg/dL — ABNORMAL HIGH (ref 70–99)
Glucose-Capillary: 197 mg/dL — ABNORMAL HIGH (ref 70–99)
Glucose-Capillary: 220 mg/dL — ABNORMAL HIGH (ref 70–99)

## 2020-11-14 LAB — TROPONIN I (HIGH SENSITIVITY): Troponin I (High Sensitivity): 9 ng/L (ref <18)

## 2020-11-14 LAB — PHOSPHORUS: Phosphorus: 3.7 mg/dL (ref 2.5–4.6)

## 2020-11-14 LAB — MAGNESIUM: Magnesium: 1.8 mg/dL (ref 1.7–2.4)

## 2020-11-14 LAB — D-DIMER, QUANTITATIVE: D-Dimer, Quant: 1.21 ug/mL-FEU — ABNORMAL HIGH (ref 0.00–0.50)

## 2020-11-14 MED ORDER — INSULIN GLARGINE 100 UNIT/ML ~~LOC~~ SOLN
25.0000 [IU] | Freq: Every day | SUBCUTANEOUS | Status: DC
  Administered 2020-11-14 – 2020-11-15 (×2): 25 [IU] via SUBCUTANEOUS
  Filled 2020-11-14 (×3): qty 0.25

## 2020-11-14 MED ORDER — CARVEDILOL 25 MG PO TABS
25.0000 mg | ORAL_TABLET | Freq: Two times a day (BID) | ORAL | Status: DC
  Administered 2020-11-14 – 2020-11-18 (×9): 25 mg via ORAL
  Filled 2020-11-14 (×9): qty 1

## 2020-11-14 MED ORDER — LEVETIRACETAM ER 500 MG PO TB24
1500.0000 mg | ORAL_TABLET | Freq: Every day | ORAL | Status: DC
  Administered 2020-11-14 – 2020-11-17 (×4): 1500 mg via ORAL
  Filled 2020-11-14 (×5): qty 3

## 2020-11-14 MED ORDER — IPRATROPIUM-ALBUTEROL 0.5-2.5 (3) MG/3ML IN SOLN
3.0000 mL | Freq: Once | RESPIRATORY_TRACT | Status: AC
  Administered 2020-11-14: 3 mL via RESPIRATORY_TRACT
  Filled 2020-11-14: qty 3

## 2020-11-14 MED ORDER — ACETAMINOPHEN 650 MG RE SUPP: 650.0000 mg | Freq: Four times a day (QID) | RECTAL | Status: DC | PRN

## 2020-11-14 MED ORDER — ALBUTEROL SULFATE (2.5 MG/3ML) 0.083% IN NEBU
2.5000 mg | INHALATION_SOLUTION | Freq: Four times a day (QID) | RESPIRATORY_TRACT | Status: DC
  Administered 2020-11-14: 2.5 mg via RESPIRATORY_TRACT
  Filled 2020-11-14: qty 3

## 2020-11-14 MED ORDER — ACETAMINOPHEN 325 MG PO TABS
650.0000 mg | ORAL_TABLET | Freq: Four times a day (QID) | ORAL | Status: DC | PRN
  Administered 2020-11-14 – 2020-11-17 (×6): 650 mg via ORAL
  Filled 2020-11-14 (×6): qty 2

## 2020-11-14 MED ORDER — ALBUTEROL SULFATE (2.5 MG/3ML) 0.083% IN NEBU
2.5000 mg | INHALATION_SOLUTION | RESPIRATORY_TRACT | Status: DC | PRN
  Administered 2020-11-15 (×2): 2.5 mg via RESPIRATORY_TRACT
  Filled 2020-11-14 (×2): qty 3

## 2020-11-14 MED ORDER — LEVALBUTEROL HCL 0.63 MG/3ML IN NEBU: 0.6300 mg | INHALATION_SOLUTION | Freq: Four times a day (QID) | RESPIRATORY_TRACT | Status: DC

## 2020-11-14 MED ORDER — ATORVASTATIN CALCIUM 10 MG PO TABS
10.0000 mg | ORAL_TABLET | Freq: Every evening | ORAL | Status: DC
  Administered 2020-11-14 – 2020-11-17 (×4): 10 mg via ORAL
  Filled 2020-11-14 (×4): qty 1

## 2020-11-14 MED ORDER — SERTRALINE HCL 50 MG PO TABS
50.0000 mg | ORAL_TABLET | Freq: Every day | ORAL | Status: DC
  Administered 2020-11-14 – 2020-11-18 (×5): 50 mg via ORAL
  Filled 2020-11-14 (×6): qty 1

## 2020-11-14 MED ORDER — HYDRALAZINE HCL 50 MG PO TABS
100.0000 mg | ORAL_TABLET | Freq: Three times a day (TID) | ORAL | Status: DC
  Administered 2020-11-14 – 2020-11-17 (×11): 100 mg via ORAL
  Filled 2020-11-14 (×14): qty 2

## 2020-11-14 MED ORDER — BENZONATATE 100 MG PO CAPS
100.0000 mg | ORAL_CAPSULE | Freq: Three times a day (TID) | ORAL | Status: DC | PRN
  Administered 2020-11-14 (×3): 100 mg via ORAL
  Filled 2020-11-14 (×3): qty 1

## 2020-11-14 MED ORDER — FUROSEMIDE 10 MG/ML IJ SOLN
40.0000 mg | Freq: Two times a day (BID) | INTRAMUSCULAR | Status: DC
  Administered 2020-11-14 – 2020-11-16 (×6): 40 mg via INTRAVENOUS
  Filled 2020-11-14 (×6): qty 4

## 2020-11-14 MED ORDER — IPRATROPIUM BROMIDE 0.02 % IN SOLN: 0.5000 mg | Freq: Four times a day (QID) | RESPIRATORY_TRACT | Status: DC

## 2020-11-14 MED ORDER — POTASSIUM CHLORIDE CRYS ER 20 MEQ PO TBCR
40.0000 meq | EXTENDED_RELEASE_TABLET | Freq: Once | ORAL | Status: AC
  Administered 2020-11-14: 40 meq via ORAL
  Filled 2020-11-14: qty 2

## 2020-11-14 MED ORDER — INSULIN ASPART 100 UNIT/ML IJ SOLN
0.0000 [IU] | Freq: Three times a day (TID) | INTRAMUSCULAR | Status: DC
  Administered 2020-11-14: 5 [IU] via SUBCUTANEOUS
  Administered 2020-11-14: 2 [IU] via SUBCUTANEOUS
  Administered 2020-11-14: 5 [IU] via SUBCUTANEOUS
  Administered 2020-11-15: 1 [IU] via SUBCUTANEOUS
  Administered 2020-11-15: 3 [IU] via SUBCUTANEOUS
  Administered 2020-11-15 – 2020-11-16 (×2): 1 [IU] via SUBCUTANEOUS
  Administered 2020-11-16: 7 [IU] via SUBCUTANEOUS

## 2020-11-14 MED ORDER — GUAIFENESIN ER 600 MG PO TB12
1200.0000 mg | ORAL_TABLET | Freq: Two times a day (BID) | ORAL | Status: DC
  Administered 2020-11-14 – 2020-11-18 (×9): 1200 mg via ORAL
  Filled 2020-11-14 (×9): qty 2

## 2020-11-14 MED ORDER — ISOSORBIDE MONONITRATE ER 30 MG PO TB24
30.0000 mg | ORAL_TABLET | Freq: Every day | ORAL | Status: DC
  Administered 2020-11-14 – 2020-11-18 (×5): 30 mg via ORAL
  Filled 2020-11-14 (×5): qty 1

## 2020-11-14 MED ORDER — PANTOPRAZOLE SODIUM 40 MG PO TBEC
40.0000 mg | DELAYED_RELEASE_TABLET | Freq: Every day | ORAL | Status: DC
  Administered 2020-11-14: 40 mg via ORAL
  Filled 2020-11-14 (×2): qty 1

## 2020-11-14 MED ORDER — DICLOFENAC SODIUM 1 % TD GEL: 2.0000 g | Freq: Four times a day (QID) | TRANSDERMAL | Status: DC | PRN

## 2020-11-14 MED ORDER — POTASSIUM CHLORIDE CRYS ER 20 MEQ PO TBCR
40.0000 meq | EXTENDED_RELEASE_TABLET | Freq: Every day | ORAL | Status: DC
  Administered 2020-11-14 – 2020-11-18 (×5): 40 meq via ORAL
  Filled 2020-11-14 (×5): qty 2

## 2020-11-14 MED ORDER — SENNOSIDES-DOCUSATE SODIUM 8.6-50 MG PO TABS
1.0000 | ORAL_TABLET | Freq: Every day | ORAL | Status: DC
  Administered 2020-11-14 – 2020-11-17 (×4): 1 via ORAL
  Filled 2020-11-14 (×4): qty 1

## 2020-11-14 MED ORDER — ENOXAPARIN SODIUM 40 MG/0.4ML IJ SOSY
40.0000 mg | PREFILLED_SYRINGE | Freq: Every day | INTRAMUSCULAR | Status: DC
  Administered 2020-11-14 – 2020-11-17 (×4): 40 mg via SUBCUTANEOUS
  Filled 2020-11-14 (×4): qty 0.4

## 2020-11-14 MED ORDER — PANCRELIPASE (LIP-PROT-AMYL) 12000-38000 UNITS PO CPEP
24000.0000 [IU] | ORAL_CAPSULE | Freq: Every day | ORAL | Status: DC
  Administered 2020-11-14 – 2020-11-17 (×4): 24000 [IU] via ORAL
  Filled 2020-11-14 (×5): qty 2

## 2020-11-14 MED ORDER — LAMOTRIGINE 100 MG PO TABS
150.0000 mg | ORAL_TABLET | Freq: Two times a day (BID) | ORAL | Status: DC
  Administered 2020-11-14 – 2020-11-18 (×9): 150 mg via ORAL
  Filled 2020-11-14 (×9): qty 2
  Filled 2020-11-14: qty 1

## 2020-11-14 MED ORDER — FERROUS SULFATE 325 (65 FE) MG PO TABS
325.0000 mg | ORAL_TABLET | Freq: Two times a day (BID) | ORAL | Status: DC
  Administered 2020-11-14 – 2020-11-18 (×9): 325 mg via ORAL
  Filled 2020-11-14 (×9): qty 1

## 2020-11-14 MED ORDER — PANCRELIPASE (LIP-PROT-AMYL) 12000-38000 UNITS PO CPEP
48000.0000 [IU] | ORAL_CAPSULE | Freq: Two times a day (BID) | ORAL | Status: DC
  Administered 2020-11-14 – 2020-11-18 (×9): 48000 [IU] via ORAL
  Filled 2020-11-14: qty 1
  Filled 2020-11-14 (×9): qty 4

## 2020-11-14 MED ORDER — TECHNETIUM TO 99M ALBUMIN AGGREGATED
4.4000 | Freq: Once | INTRAVENOUS | Status: AC | PRN
  Administered 2020-11-14: 4.4 via INTRAVENOUS

## 2020-11-14 MED ORDER — PANCRELIPASE (LIP-PROT-AMYL) 25000-79000 UNITS PO CPEP: 1.0000 | ORAL_CAPSULE | ORAL | Status: DC

## 2020-11-14 NOTE — Progress Notes (Signed)
Mobility Specialist: Progress Note   11/14/20 1721  Mobility  Activity Ambulated in hall  Level of Assistance Contact guard assist, steadying assist  Assistive Device None  Distance Ambulated (ft) 360 ft  Mobility Ambulated with assistance in hallway  Mobility Response Tolerated well  Mobility performed by Mobility specialist  $Mobility charge 1 Mobility   Pt ambulated contact guard using gait belt to assist with balance as pt was a little unsteady during walk. Pt states she occasionally uses a cane at home but not consistently. Pt c/o discomfort in her L shoulder during walk, otherwise asx. Pt sitting EOB with call bell at her side.   [facility] [name] Mobility Specialist Mobility Specialist Phone: [phone]

## 2020-11-14 NOTE — Progress Notes (Signed)
RT note. Patient refusing CPAP tonight due to full face mask. Patient CPAP is all set up and ready to use if patient changes mind. Currently on auto 18/5. RT will continue to monitor

## 2020-11-14 NOTE — ED Notes (Signed)
Pt Sao2 was dropping from 90-94 while she was sleeping. Pt put on 2L [place].

## 2020-11-14 NOTE — Telephone Encounter (Signed)
  Chronic Care Management   Inpatient Admit Review Note  11/14/2020 Name: [name] MRN: [id number] DOB: 1945/12/31  [name] is a 75 y.o. year old female who is a primary care patient of [name], MD. [name] is actively engaged with the embedded care management team in the primary care practice and is being followed by [place], PharmD, and BSW for assistance with disease management and care coordination needs related to CHF and DM.   [name] is currently admitted to the hospital for evaluation and treatment of Shortness of Breath, unspecified Chest Pain, Hypokalemia, and Microcytic Anemia.   Plan: CM team will collaborate with [facility] and will follow patient post discharge.    Daneen Schick, BSW, CDP Social Worker, Certified Dementia Practitioner [place] [facility] [place] [facility] [phone]

## 2020-11-14 NOTE — Consult Note (Signed)
   [facility] Inpatient Consult   11/14/2020  ASYAH CANDLER [birth year] [id number]  [place] [facility] [ACO] Patient: [employer]  Notified by Embedded [facility] Embedded social worker of patient's hospitalization.  Patient will have the transition of care call conducted by the primary care provider. This patient is in a chronic care management program with Embedded Care Management team for Type 2 diabetes and stage 2 kidney disease.  Plan: Will update the Embedded Care Management team of post hospital disposition and any transitional needs.   Please contact for further questions,  Natividad Brood, RN BSN [facility] Liaison  [phone] business mobile phone Toll free office [phone]  Fax number: [phone] [email or web address]

## 2020-11-14 NOTE — Progress Notes (Signed)
Care started prior to midnight in the emergency room and patient was admitted early this morning after midnight and I am in current agreement with assessment and plan done by Dr. Gean Birchwood.  Additional changes to the plan of care has been made accordingly.  Patient is an obese African-American female with a past medical history significant for but not limited to chronic diastolic CHF/pulm hypertension, diabetes mellitus type 2, seizures, syncope, hypertension as well as other comorbidities who presented to the ER for worsening shortness of breath for a week as well as associated productive cough.  She states that she is having chest pain with coughing otherwise she is chest pain-free.  She states that she has been compliant with her Lasix. She states the cough has been productive with intermittently being discolored and other times being clear. She has multiple family members of her household with similar symptoms.  In the ED she was found to be afebrile and mildly hypoxic requiring supplemental oxygen.  Chest x-ray showed no acute infiltrates and COVID testing was negative but she did have elevated BNP of 164.  Patient's symptoms were improving with nebulized treatments and she was admitted for management of shortness of breath and hypoxia requiring 2 L of supplemental oxygen likely in the setting of bronchitis.  She underwent testing with a respiratory virus panel which was also negative. Currently she is being admitted and treated for the following but not limited too:   Shortness of breath could be from acute bronchitis with cough and sputum production and presently COVID and flu negative.   -Will check other respiratory viral panel and Negative.   -Continue with nebulizer treatment patient did receive Solu-Medrol by the EMS.   -We will closely monitor.  -Continue with IV Lasix as below -D-Dimer was 1.21 and will check a VQ Scan given given Contrast shortage -We will change to Xopenex and  Atrovent added guaifenesin 1200 mg p.o. twice daily and add Flutter Valve, and Incentive Spirometry  -C/w Benzonatate 100 mg po TIDprn Cough  Chest pain likely from coughing.  -Troponins were negative.   -Presently chest pain-free.   -Chest pain episode and coughing.   -D-Dimer as above  Hypokalemia -Patient's K+ was 3.4 -Replete with KCl 40 mEQ BID x2 -Continue to Monitor and Replete as Necessary -Repeat CMP in the AM   Hypertension  -C/w Carvedilol 25 mg po BID, Furosemide IV 40 mg BID, Hydralazine 100 mg q8h, Isororbide Mnonitrate 30 mg po Daily -C/w   Chronic Diastolic CHF and Plmonary Hypertension  -Presently on Lasix and changed to IV 40 mg BID.   -Last EF was 50 to 55%. -Strict I's and O's and Daily Weights -Continue to Monitor for S/Sx of Volume Overload -Repeat CXR in the AM  Depression -C/w Sertraline 50 mg po Daily   GERD -C/w Pantoprazole po 40 mg Daily  Diabetes Mellitus Type 2  -C/w Lantus 25 units sq qHS and with Sensitive  sliding scale coverage.  History of Seizures  -C/w Lamotrigine 150 mg po BID and Levetiracetam 1500 mg po qHS -C/w Seizure Precautions .  Hyperlipidemia  -C/w Atorvastatin 10 mg po qHS  Chronic Microcytic Anemia -Patient's Hgb/Hct went from 10.2/32.3 -> 11.0/34.8 with MCV of 72.2 -Check Anemia Panel -C/w Ferrous Sulfate 325 mg po BID  -Continue to Monitor for S/Sx of Bleeding; Currently no overt bleeding noted -Repeat CBC in the AM   Obesity Hypoventilation Syndrome -On CPAP at bedtime.  Obesity -Complicates overall prognosis and care -Estimated body mass  index is 38.61 kg/m as calculated from the following:   Height as of this encounter: 5' (1.524 m).   Weight as of this encounter: 89.7 kg.  -Weight Loss and Dietary Couneling given   We will continue to monitor the patient's clinical response to intervention and repeat blood work and imaging in the a.m.

## 2020-11-14 NOTE — H&P (Signed)
History and Physical    [name] NOB:[id number] DOB: [birth year] DOA: 11/13/2020  PCP: [name], MD  Patient coming from: Home.  Chief Complaint: Shortness of breath.  HPI: [name] is a 75 y.o. female with history of chronic diastolic CHF/pulmonary hypertension, diabetes mellitus type 2, seizures, syncope, hypertension presents to the ER because of shortness of breath and cough for the last 1 week.  Has chest pain with coughing otherwise chest pain-free.  Has not gained weight.  Patient states she has been compliant with her Lasix and all home medications.  Cough has been productive of sputum sometimes discolored and patient states others in her family has been having similar symptoms.  ED Course: In the ER patient is afebrile mildly hypoxic requiring oxygen chest x-ray was not showing any infiltrates COVID test was negative BNP was 164 high sensitive troponins were 11 and 9 potassium was 3.4 CBC shows hemoglobin of 10.2 at baseline.  Patient symptoms are improving and nebulizer treatment.  Patient admitted for further management of shortness of breath with hypoxia requiring 2 L oxygen likely from bronchitis.  Review of Systems: As per HPI, rest all negative.   Past Medical History:  Diagnosis Date  . Abnormal liver function     in the past.  . Anemia   . Arthritis    all over  . Bruises easily   . Cataract    right eye;immature  . CHF (congestive heart failure) ([place])   . Chronic back pain    stenosis  . Chronic cough   . Chronic kidney disease   . COPD (chronic obstructive pulmonary disease) ([place])   . COVID    aug/sept 2021  . Demand myocardial infarction [employer]) 2012   Demand Infarction in setting of Pancreatitis --> mild Troponin elevation, NON-OBSTRUCTIVE CAD  . Depression    takes Abilify daily as well as Zoloft  . Diastolic heart failure 9476   Grade 1 diastolic Dysfunction by Echo   . Diverticulosis   . DM (diabetes mellitus) ([place])    takes Victoza  daily as well as Lantus and Humalog  . Empty sella ([place])    on MRI in 2009.  . Glaucoma   . Headache(784.0)    last migraine-4-55yrs ago  . History of blood transfusion    no abnormal reaction noted  . History of colon polyps    benign  . HTN (hypertension)    takes [employer] daily  . Hyperlipidemia    takes Lipitor daily  . Joint swelling   . Nocturia   . Obesity hypoventilation syndrome ([place])   . Obstructive sleep apnea 02/2018   Notably improved split-night study with weight loss from 270 (2017) down to 250 pounds (2019).  . Pancreatitis    takes Pancrelipase daily  . Parkinson's disease ([place])    takes Sinemet daily  . Peripheral neuropathy   . Pneumonia 2012  . Seizures ([place])    takes Lamictal daily and Primidone nightly;last seizure 2wks ago  . Urinary urgency    With increased frequency  . Varicose veins of both lower extremities with pain    With edema.  Takes daily Lasix    Past Surgical History:  Procedure Laterality Date  . ABDOMINAL HYSTERECTOMY    . APPENDECTOMY    . BACK SURGERY    . Cardiac Event Monitor  September-October 2017   Sinus rhythm with occasional PACs and artifact. No arrhythmias besides one short run of tachycardia.  . CHOLECYSTECTOMY    .  COLONOSCOPY N/A 08/15/2012   Procedure: COLONOSCOPY;  Surgeon: Arta Silence, MD;  Location: [facility];  Service: Endoscopy;  Laterality: N/A;  . COLONOSCOPY WITH PROPOFOL Left 06/17/2017   Procedure: COLONOSCOPY WITH PROPOFOL;  Surgeon: [name], MD;  Location: [facility];  Service: Gastroenterology;  Laterality: Left;  . CORONARY CALCIUM SCORE AND CTA  04/2018   Coronary calcium score 71.9.  Very large, hyperdynamic LAD wrapping apex giving rise to PDA.  Moderate LAD-diagonal and circumflex plaque -> CT FFR suggested positive findings in distal D1, D2 and distal circumflex.  Referred for cath. ==> FALSE POSITIVE  . ESOPHAGOGASTRODUODENOSCOPY    . eye cysts Bilateral   . LASIK     . LEFT HEART CATH AND CORONARY ANGIOGRAPHY N/A 05/16/2018   Procedure: LEFT HEART CATH AND CORONARY ANGIOGRAPHY;  Surgeon: Belva Crome, MD;  Location: [facility] INVASIVE [facility] LAB:  Angiographically normal coronary arteries with LVEDP of 21 mmHg.  Large draping hyperdominant LAD that wraps the apex and provides distal half of the PDA.  Relatively small caliber distal Cx -->proxLPDA, non-dom RCA. No Cx or Diag lesions -- FALSE + CT FFR  . LEFT HEART CATH AND CORONARY ANGIOGRAPHY  2009/March 2012   2009: (Dr. Alvie Heidelberg) Nonobstructive CAD; 2012: Minimal CAD --> false positive stress test  . NM MYOVIEW LTD  01/2016; 01/2018   a)LOW RISK. No ischemia or infarction;; b) EF 55-60%. NO ST changes.  No ischemia or Infarction. NO significant RV enlargement.  LOW RISK.  Marland Kitchen Polysomnogram  02/2018   (Dr. Brett Fairy from neurology): Split study was not adequately done due to low AHI.  She slept reclined and had an AHI less than 10, prolonged hypoxemia and no hypercapnia. -->  She has home oxygen already prescribed, but did not meet criteria for nightly oxygen.  (Was noted that the patient did not cooperate well with study.  Titration was discussed.  Marland Kitchen PRESSURE SENSOR/CARDIOMEMS N/A 11/09/2019   Procedure: PRESSURE SENSOR/CARDIOMEMS;  Surgeon: Larey Dresser, MD;  Location: [facility];  Service: Cardiovascular;  Laterality: N/A;  . RECTAL POLYPECTOMY    . RIGHT/LEFT HEART CATH AND CORONARY ANGIOGRAPHY N/A 04/06/2019   Procedure: RIGHT/LEFT HEART CATH AND CORONARY ANGIOGRAPHY;  Surgeon: Leonie Man, MD;  Location: [facility] CV LAB; angiographically normal coronary arteries.  Moderately elevated AD LVEDP..  Mild pulmonary hypertension: PA P 56/14 mmHg-mean 31 mmHg.  RAP 10 mmHg.  RV P-EDP 54/4 mmHg - 12 mmHg.  PCWP 11-15 mmHg.  CO-CI: 7.35-3.73.  . TONSILLECTOMY    . TRANSTHORACIC ECHOCARDIOGRAM  01/2016; 05/2017   A) Normal LV chamber size with mod LVH pattern. EF 50-55%. Severe LA dilation. Mod RA dilation.  PAP elevated at 38 mmHg;; B) Mild concentric LVH.  EF 55-60% & no RWMA.  Grade 2 DD.  Diastolic flattening of the ventricular septum == ? elevated PAP.  Mild aortic valve calcification/sclerosis.  Mod LA dilation.  Mild RV dilation & Mod RA dilation  . TRANSTHORACIC ECHOCARDIOGRAM  10/2017; 01/2018   a) EF 55-60%.  No RWMA,  Moderate LA dilation.  Mild LA dilation.  Peak PA pressures ~57 mmHg (moderate);;; b) EF 55-60%.  GRII DD.  Suggestion of RV volume overload with moderate RV dilation.  Severe LA and RA dilation.Marland Kitchen  PA pressure ~67%.  . TRANSTHORACIC ECHOCARDIOGRAM  01/2019   Normal LV size and function.  EF 50 to 55%.  Impaired relaxation.  RV appears to have moderately reduced function.  Severely enlarged.  Cannot fully assess RV  pressures.  Hypermobile interatrial septum.  Right atrium severely dilated.  . TUBAL LIGATION    . vaginal cyst removed     several times     reports that she has quit smoking. Her smoking use included cigarettes. She has a 12.50 pack-year smoking history. She has never used smokeless tobacco. She reports that she does not drink alcohol and does not use drugs.  Allergies  Allergen Reactions  . Other Anaphylaxis and Rash    Bleach  . Penicillins Hives    Did it involve swelling of the face/tongue/throat, SOB, or low BP? No Did it involve sudden or severe rash/hives, skin peeling, or any reaction on the inside of your mouth or nose? Yes Did you need to seek medical attention at a hospital or doctor's office? Yes When did it last happen?Childhood allergy  If all above answers are "NO", may proceed with cephalosporin use.    . Sulfa Antibiotics Hives  . Aspirin Other (See Comments)     On aspirin 81 mg - Rectal bleeding in dec 2018  . Codeine     Headache and makes the patient feel "off"    Family History  Problem Relation Age of Onset  . Allergies Father   . Heart disease Father        before 21  . Heart failure Father   . Hypertension Father   .  Hyperlipidemia Father   . Heart disease Mother   . Diabetes Mother   . Hypertension Mother   . Hyperlipidemia Mother   . Hypertension Sister   . Heart disease Sister        before 51  . Hyperlipidemia Sister   . Diabetes Brother   . Hypertension Brother   . Diabetes Sister   . Hypertension Sister   . Diabetes Son   . Hypertension Son   . Cancer Other     Prior to Admission medications   Medication Sig Start Date End Date Taking? Authorizing Provider  acetaminophen (TYLENOL) 500 MG tablet Take 500-1,000 mg by mouth every 4 (four) hours as needed for moderate pain.     [provider]  albuterol (VENTOLIN HFA) 108 (90 Base) MCG/ACT inhaler Inhale 1-2 puffs into the lungs every 6 (six) hours as needed for wheezing or shortness of breath.    [provider]  Alcohol Swabs (B-D SINGLE USE SWABS REGULAR) PADS USE TWICE DAILY AS DIRECTED 08/31/20   [name], MD  Ascorbic Acid (VITAMIN C WITH ROSE HIPS) 1000 MG tablet Take 1,000 mg by mouth daily.    [provider]  atorvastatin (LIPITOR) 10 MG tablet TAKE 1 TABLET EVERY EVENING. 06/22/20   [name], MD  azithromycin (ZITHROMAX Z-PAK) 250 MG tablet Take 2 tablets (500 mg) on  Day 1,  followed by 1 tablet (250 mg) once daily on Days 2 through 5. 11/10/20 11/15/20  Ghumman, Ramandeep, NP  B-D ULTRAFINE III SHORT PEN 31G X 8 MM MISC USE DAILY WITH VICTOZA AND/OR NOVOLOG. 07/08/20   [name], MD  benzonatate (TESSALON PERLES) 100 MG capsule Take 1 capsule (100 mg total) by mouth 3 (three) times daily as needed for cough. 11/10/20 11/10/21  Bary Castilla, NP  Blood Glucose Calibration (TRUE METRIX LEVEL 1) Low SOLN Use as directed  Dx:e11.22 03/09/20   [name], MD  Blood Glucose Monitoring Suppl (TRUE METRIX METER) w/Device KIT Use as directed to check blood sugars 2 times per day dx: e11.22 03/07/20   [name], MD  carvedilol (COREG) 25 MG tablet TAKE 1 TABLET TWICE DAILY WITH A MEAL 08/16/20    Leonie Man, MD  Cholecalciferol (VITAMIN D-3) 1000 units CAPS Take 5,000 Units by mouth daily.     [provider]  dexlansoprazole (DEXILANT) 60 MG capsule Take 1 capsule (60 mg total) by mouth daily. 11/08/20   [name], MD  diclofenac sodium (VOLTAREN) 1 % GEL Apply 2 g topically 4 (four) times daily as needed (pain). 05/17/18   Geradine Girt, DO  ferrous sulfate 324 MG TBEC Take 324 mg by mouth 2 (two) times daily.    [provider]  furosemide (LASIX) 80 MG tablet Take 1 tablet (80 mg total) by mouth daily. 43m daily Monday-Friday, 41mon Saturday and Sunday 08/16/20   McLarey DresserMD  glucose blood (TRUE METRIX BLOOD GLUCOSE TEST) test strip Use as directed to check blood sugars 2 times per day dx: e11.22 03/22/20   SaGlendale ChardMD  hydrALAZINE (APRESOLINE) 100 MG tablet Take 1 tablet (100 mg total) by mouth 3 (three) times daily. 10/21/20   McLarey DresserMD  hydrocortisone cream 1 % Apply 1 application topically daily as needed for itching.     [provider]  insulin glargine, 2 Unit Dial, (TOUJEO MAX SOLOSTAR) 300 UNIT/ML Solostar Pen Inject 25 Units into the skin at bedtime.    [provider]  insulin lispro (HUMALOG KWIKPEN) 100 UNIT/ML KwikPen Inject 0.02-0.04 mLs (2-4 Units total) into the skin 3 (three) times daily as needed (high blood sugar over 150). 09/14/19   SaGlendale ChardMD  Isopropyl Alcohol (ALCOHOL WIPES) 70 % MISC Apply 1 each topically 2 (two) times daily. 06/25/19   MoMinette BrineFNP  isosorbide mononitrate (IMDUR) 30 MG 24 hr tablet Take 1 tablet (30 mg total) by mouth daily. 11/01/20 11/01/21  HaLeonie ManMD  lamoTRIgine (LAMICTAL) 150 MG tablet Take 1 tablet (150 mg total) by mouth 2 (two) times daily. 09/11/19   AqCameron SprangMD  levETIRAcetam (KEPPRA XR) 500 MG 24 hr tablet Take 3 tablets every night 10/26/20   AqCameron SprangMD  Menthol, Topical Analgesic, (STOPAIN ROLL-ON EX) Apply 1 application  topically daily as needed (pain).    [provider]  neomycin-bacitracin-polymyxin (NEOSPORIN) ointment Apply 1 application topically as needed for wound care.     [provider]  ondansetron (ZOFRAN) 4 MG tablet  02/22/20   [provider]  OZEMPIC, 1 MG/DOSE, 4 MG/3ML SOPN INJECT 1 MG INTO THE SKIN ONCE A WEEK. 11/08/20   SaGlendale ChardMD  Pancrelipase, Lip-Prot-Amyl, (ZENPEP PO) Take by mouth.    [provider]  Pancrelipase, Lip-Prot-Amyl, 25000-79000 units CPEP Take 1-2 capsules by mouth See admin instructions. Take 2 capsules in the morning, 1 capsule with lunch, and 2 capsules in the evening.    [provider]  potassium chloride SA (KLOR-CON) 20 MEQ tablet TAKE 2 TABLETS EVERY DAY BUT WHEN TAKING METOLAZONE TAKE 3 TABLETS DAILY AS DIRECTED 08/31/20   HaLeonie ManMD  sennosides-docusate sodium (SENOKOT-S) 8.6-50 MG tablet Take 1 tablet by mouth daily.    [provider]  sertraline (ZOLOFT) 50 MG tablet TAKE 1 TABLET EVERY DAY 08/11/20   SaGlendale ChardMD  tolnaftate (TINACTIN) 1 % cream Apply 1 application topically daily as needed (foot fungus).     [provider]  TRUEplus Lancets 33G MISC Use as directed to check blood sugars 2 times per day dx: e11.22  03/22/20   [name], MD    Physical Exam: Constitutional: Moderately built and nourished. Vitals:   11/14/20 0115 11/14/20 0148 11/14/20 0245 11/14/20 0400  BP:  140/70 (!) 147/93 (!) 158/80  Pulse: 82 89 76 85  Resp: (!) 21 17 20  (!) 28  Temp:      TempSrc:      SpO2: 92% 93% 94% 92%   Eyes: Anicteric no pallor. ENMT: No discharge from the ears eyes nose or mouth. Neck: No mass felt.  No neck rigidity.  No JVD appreciated. Respiratory: No obvious wheezing or crepitations. Cardiovascular: S1-S2 heard. Abdomen: Soft nontender bowel sounds present. Musculoskeletal: No edema. Skin: No rash. Neurologic: Alert awake oriented to time place and person.   Moves all extremities. Psychiatric: Appears normal.  Normal affect.   Labs on Admission: I have personally reviewed following labs and imaging studies  CBC: Recent Labs  Lab 11/13/20 2307  WBC 9.2  NEUTROABS 8.2*  HGB 10.2*  HCT 32.3*  MCV 72.3*  PLT 454   Basic Metabolic Panel: Recent Labs  Lab 11/13/20 2307  NA 136  K 3.4*  CL 98  CO2 28  GLUCOSE 200*  BUN 7*  CREATININE 0.75  CALCIUM 8.8*   GFR: CrCl cannot be calculated (Unknown ideal weight.). Liver Function Tests: Recent Labs  Lab 11/13/20 2307  AST 20  ALT 16  ALKPHOS 98  BILITOT 0.7  PROT 7.8  ALBUMIN 3.2*   No results for input(s): LIPASE, AMYLASE in the last 168 hours. No results for input(s): AMMONIA in the last 168 hours. Coagulation Profile: No results for input(s): INR, PROTIME in the last 168 hours. Cardiac Enzymes: No results for input(s): CKTOTAL, CKMB, CKMBINDEX, TROPONINI in the last 168 hours. BNP (last 3 results) No results for input(s): PROBNP in the last 8760 hours. HbA1C: No results for input(s): HGBA1C in the last 72 hours. CBG: No results for input(s): GLUCAP in the last 168 hours. Lipid Profile: No results for input(s): CHOL, HDL, LDLCALC, TRIG, CHOLHDL, LDLDIRECT in the last 72 hours. Thyroid Function Tests: No results for input(s): TSH, T4TOTAL, FREET4, T3FREE, THYROIDAB in the last 72 hours. Anemia Panel: No results for input(s): VITAMINB12, FOLATE, FERRITIN, TIBC, IRON, RETICCTPCT in the last 72 hours. Urine analysis:    Component Value Date/Time   COLORURINE YELLOW 01/28/2020 2325   APPEARANCEUR CLEAR 01/28/2020 2325   LABSPEC 1.005 01/28/2020 2325   PHURINE 7.0 01/28/2020 2325   GLUCOSEU NEGATIVE 01/28/2020 2325   GLUCOSEU NEGATIVE 04/17/2016 0955   HGBUR NEGATIVE 01/28/2020 2325   BILIRUBINUR Negative 06/06/2020 1322   KETONESUR NEGATIVE 01/28/2020 2325   PROTEINUR Negative 06/06/2020 1322   PROTEINUR NEGATIVE 01/28/2020 2325   UROBILINOGEN 0.2 06/06/2020  1322   UROBILINOGEN 0.2 04/17/2016 0955   NITRITE Negative 06/06/2020 1322   NITRITE NEGATIVE 01/28/2020 2325   LEUKOCYTESUR Negative 06/06/2020 1322   LEUKOCYTESUR NEGATIVE 01/28/2020 2325   Sepsis Labs: @LABRCNTIP (procalcitonin:4,lacticidven:4) ) Recent Results (from the past 240 hour(s))  Novel Coronavirus, NAA ([facility])     Status: None   Collection Time: 11/08/20  2:01 PM   Specimen: Nasopharyngeal(NP) swabs in vial transport medium  Result Value Ref Range Status   SARS-CoV-2, NAA Not Detected Not Detected Final    Comment: This nucleic acid amplification test was developed and its performance characteristics determined by [employer]. Nucleic acid amplification tests include RT-PCR and TMA. This test has not been FDA cleared or approved. This test has been authorized by FDA under an Emergency Use  Authorization (EUA). This test is only authorized for the duration of time the declaration that circumstances exist justifying the authorization of the emergency use of in vitro diagnostic tests for detection of SARS-CoV-2 virus and/or diagnosis of COVID-19 infection under section 564(b)(1) of the Act, 21 U.S.C. 956OZH-0(Q) (1), unless the authorization is terminated or revoked sooner. When diagnostic testing is negative, the possibility of a false negative result should be considered in the context of a patient's recent exposures and the presence of clinical signs and symptoms consistent with COVID-19. An individual without symptoms of COVID-19 and who is not shedding SARS-CoV-2 virus wo uld expect to have a negative (not detected) result in this assay.   SARS-COV-2, NAA 2 DAY TAT     Status: None   Collection Time: 11/08/20  2:01 PM  Result Value Ref Range Status   SARS-CoV-2, NAA 2 DAY TAT Performed  Final  Resp Panel by RT-PCR (Flu A&B, Covid) Nasopharyngeal Swab     Status: None   Collection Time: 11/13/20  9:56 PM   Specimen: Nasopharyngeal Swab;  Nasopharyngeal(NP) swabs in vial transport medium  Result Value Ref Range Status   SARS Coronavirus 2 by RT PCR NEGATIVE NEGATIVE Final    Comment: (NOTE) SARS-CoV-2 target nucleic acids are NOT DETECTED.  The SARS-CoV-2 RNA is generally detectable in upper respiratory specimens during the acute phase of infection. The lowest concentration of SARS-CoV-2 viral copies this assay can detect is 138 copies/mL. A negative result does not preclude SARS-Cov-2 infection and should not be used as the sole basis for treatment or other patient management decisions. A negative result may occur with  improper specimen collection/handling, submission of specimen other than nasopharyngeal swab, presence of viral mutation(s) within the areas targeted by this assay, and inadequate number of viral copies(<138 copies/mL). A negative result must be combined with clinical observations, patient history, and epidemiological information. The expected result is Negative.  Fact Sheet for Patients:  EntrepreneurPulse.com.au  Fact Sheet for Healthcare Providers:  IncredibleEmployment.be  This test is no t yet approved or cleared by the [place] FDA and  has been authorized for detection and/or diagnosis of SARS-CoV-2 by FDA under an Emergency Use Authorization (EUA). This EUA will remain  in effect (meaning this test can be used) for the duration of the COVID-19 declaration under Section 564(b)(1) of the Act, 21 U.S.C.section 360bbb-3(b)(1), unless the authorization is terminated  or revoked sooner.       Influenza A by PCR NEGATIVE NEGATIVE Final   Influenza B by PCR NEGATIVE NEGATIVE Final    Comment: (NOTE) The Xpert Xpress SARS-CoV-2/FLU/RSV plus assay is intended as an aid in the diagnosis of influenza from Nasopharyngeal swab specimens and should not be used as a sole basis for treatment. Nasal washings and aspirates are unacceptable for Xpert Xpress  SARS-CoV-2/FLU/RSV testing.  Fact Sheet for Patients: EntrepreneurPulse.com.au  Fact Sheet for Healthcare Providers: IncredibleEmployment.be  This test is not yet approved or cleared by the [place] FDA and has been authorized for detection and/or diagnosis of SARS-CoV-2 by FDA under an Emergency Use Authorization (EUA). This EUA will remain in effect (meaning this test can be used) for the duration of the COVID-19 declaration under Section 564(b)(1) of the Act, 21 U.S.C. section 360bbb-3(b)(1), unless the authorization is terminated or revoked.  Performed at [facility], [place] [address] [place], [place] [address]      Radiological Exams on Admission: DG Chest Portable 1 View  Result Date: 11/13/2020 CLINICAL DATA:  Cough EXAM: PORTABLE CHEST 1 VIEW COMPARISON:  03/09/2020 FINDINGS: Lung volumes are small and there is bibasilar atelectasis. No confluent pulmonary infiltrate. No pneumothorax or pleural effusion. Mild cardiomegaly is stable. Enlargement of the central pulmonary arteries is again identified, similar to prior examination and better assessed on prior CT examination of 03/29/2020. No superimposed overt pulmonary edema. No acute bone abnormality. IMPRESSION: Pulmonary hypoinflation Stable cardiomegaly Stable enlargement of the central pulmonary arteries. Electronically Signed   By: Fidela Salisbury MD   On: 11/13/2020 22:42    EKG: Independently reviewed.  Normal sinus rhythm with sinus arrhythmia computer reads it as A. fib but P waves are clear.  Assessment/Plan Principal Problem:   SOB (shortness of breath) Active Problems:   Obesity hypoventilation syndrome (HCC)   Chronic pancreatitis (HCC)   Morbid obesity with BMI of 45.0-49.9, adult (HCC)   Type II diabetes mellitus, uncontrolled ([place])   Seizure disorder (HCC)   Chronic diastolic CHF (congestive heart failure), NYHA class 2 ([place])   Pulmonary hypertension,  unspecified (HCC)   Chronic kidney disease, stage 2 (mild)    1. Shortness of breath could be from acute bronchitis with cough and sputum production and presently COVID and flu negative.  Will check other respiratory viral panel.  Continue with nebulizer treatment patient did receive Solu-Medrol by the EMS.  We will closely monitor.  I will dose Lasix IV for now. 2. Chest pain likely from coughing.  Troponins were negative.  Presently chest pain-free.  Chest pain episode and coughing.  Will check D-dimer. 3. Hypertension on Lasix Coreg and hydralazine Imdur. 4. Chronic diastolic CHF and pulmonary hypertension presently on Lasix I have dosed with IV.  Last EF was 50 to 55%. 5. Diabetes mellitus type 2 on Lantus and sliding scale coverage. 6. History of seizures on Lamictal and Keppra. 7. Hyperlipidemia on statins. 8. Chronic anemia follow CBC. 9. Obesity hypoventilation on CPAP at bedtime.   DVT prophylaxis: Lovenox. Code Status: Full code. Family Communication: Discussed with patient. Disposition Plan: Home. Consults called: None. Admission status: Observation.   Rise Patience MD [facility] Pager [phone].  If 7PM-7AM, please contact night-coverage [email or web address] Password [facility]  11/14/2020, 4:59 AM

## 2020-11-15 ENCOUNTER — Telehealth: Payer: Medicare HMO

## 2020-11-15 ENCOUNTER — Observation Stay (HOSPITAL_COMMUNITY): Payer: Medicare HMO

## 2020-11-15 ENCOUNTER — Ambulatory Visit (HOSPITAL_COMMUNITY): Payer: Medicare HMO

## 2020-11-15 DIAGNOSIS — F419 Anxiety disorder, unspecified: Secondary | ICD-10-CM | POA: Diagnosis present

## 2020-11-15 DIAGNOSIS — I5033 Acute on chronic diastolic (congestive) heart failure: Secondary | ICD-10-CM

## 2020-11-15 DIAGNOSIS — J209 Acute bronchitis, unspecified: Secondary | ICD-10-CM | POA: Diagnosis present

## 2020-11-15 DIAGNOSIS — F32A Depression, unspecified: Secondary | ICD-10-CM | POA: Diagnosis present

## 2020-11-15 DIAGNOSIS — R7989 Other specified abnormal findings of blood chemistry: Secondary | ICD-10-CM | POA: Diagnosis present

## 2020-11-15 DIAGNOSIS — Z6838 Body mass index (BMI) 38.0-38.9, adult: Secondary | ICD-10-CM | POA: Diagnosis not present

## 2020-11-15 DIAGNOSIS — J439 Emphysema, unspecified: Secondary | ICD-10-CM | POA: Diagnosis present

## 2020-11-15 DIAGNOSIS — Z8616 Personal history of COVID-19: Secondary | ICD-10-CM | POA: Diagnosis not present

## 2020-11-15 DIAGNOSIS — J811 Chronic pulmonary edema: Secondary | ICD-10-CM | POA: Diagnosis not present

## 2020-11-15 DIAGNOSIS — R0609 Other forms of dyspnea: Secondary | ICD-10-CM | POA: Diagnosis not present

## 2020-11-15 DIAGNOSIS — E785 Hyperlipidemia, unspecified: Secondary | ICD-10-CM | POA: Diagnosis present

## 2020-11-15 DIAGNOSIS — I517 Cardiomegaly: Secondary | ICD-10-CM | POA: Diagnosis not present

## 2020-11-15 DIAGNOSIS — E662 Morbid (severe) obesity with alveolar hypoventilation: Secondary | ICD-10-CM | POA: Diagnosis present

## 2020-11-15 DIAGNOSIS — E1165 Type 2 diabetes mellitus with hyperglycemia: Secondary | ICD-10-CM | POA: Diagnosis present

## 2020-11-15 DIAGNOSIS — G40909 Epilepsy, unspecified, not intractable, without status epilepticus: Secondary | ICD-10-CM | POA: Diagnosis present

## 2020-11-15 DIAGNOSIS — K861 Other chronic pancreatitis: Secondary | ICD-10-CM | POA: Diagnosis present

## 2020-11-15 DIAGNOSIS — I5032 Chronic diastolic (congestive) heart failure: Secondary | ICD-10-CM | POA: Diagnosis not present

## 2020-11-15 DIAGNOSIS — E876 Hypokalemia: Secondary | ICD-10-CM | POA: Diagnosis present

## 2020-11-15 DIAGNOSIS — E871 Hypo-osmolality and hyponatremia: Secondary | ICD-10-CM | POA: Diagnosis present

## 2020-11-15 DIAGNOSIS — I509 Heart failure, unspecified: Secondary | ICD-10-CM | POA: Diagnosis not present

## 2020-11-15 DIAGNOSIS — K219 Gastro-esophageal reflux disease without esophagitis: Secondary | ICD-10-CM | POA: Diagnosis present

## 2020-11-15 DIAGNOSIS — I130 Hypertensive heart and chronic kidney disease with heart failure and stage 1 through stage 4 chronic kidney disease, or unspecified chronic kidney disease: Secondary | ICD-10-CM | POA: Diagnosis present

## 2020-11-15 DIAGNOSIS — Z886 Allergy status to analgesic agent status: Secondary | ICD-10-CM | POA: Diagnosis not present

## 2020-11-15 DIAGNOSIS — R0602 Shortness of breath: Secondary | ICD-10-CM | POA: Diagnosis not present

## 2020-11-15 DIAGNOSIS — I272 Pulmonary hypertension, unspecified: Secondary | ICD-10-CM | POA: Diagnosis present

## 2020-11-15 DIAGNOSIS — Z20822 Contact with and (suspected) exposure to covid-19: Secondary | ICD-10-CM | POA: Diagnosis present

## 2020-11-15 DIAGNOSIS — E1122 Type 2 diabetes mellitus with diabetic chronic kidney disease: Secondary | ICD-10-CM | POA: Diagnosis present

## 2020-11-15 DIAGNOSIS — N182 Chronic kidney disease, stage 2 (mild): Secondary | ICD-10-CM | POA: Diagnosis present

## 2020-11-15 DIAGNOSIS — I2510 Atherosclerotic heart disease of native coronary artery without angina pectoris: Secondary | ICD-10-CM | POA: Diagnosis present

## 2020-11-15 DIAGNOSIS — J9601 Acute respiratory failure with hypoxia: Secondary | ICD-10-CM | POA: Diagnosis present

## 2020-11-15 LAB — CBC WITH DIFFERENTIAL/PLATELET
Abs Immature Granulocytes: 0.02 10*3/uL (ref 0.00–0.07)
Basophils Absolute: 0 10*3/uL (ref 0.0–0.1)
Basophils Relative: 1 %
Eosinophils Absolute: 0.1 10*3/uL (ref 0.0–0.5)
Eosinophils Relative: 1 %
HCT: 33 % — ABNORMAL LOW (ref 36.0–46.0)
Hemoglobin: 10.2 g/dL — ABNORMAL LOW (ref 12.0–15.0)
Immature Granulocytes: 0 %
Lymphocytes Relative: 20 %
Lymphs Abs: 1.5 10*3/uL (ref 0.7–4.0)
MCH: 22.8 pg — ABNORMAL LOW (ref 26.0–34.0)
MCHC: 30.9 g/dL (ref 30.0–36.0)
MCV: 73.7 fL — ABNORMAL LOW (ref 80.0–100.0)
Monocytes Absolute: 0.9 10*3/uL (ref 0.1–1.0)
Monocytes Relative: 11 %
Neutro Abs: 5.2 10*3/uL (ref 1.7–7.7)
Neutrophils Relative %: 67 %
Platelets: 287 10*3/uL (ref 150–400)
RBC: 4.48 MIL/uL (ref 3.87–5.11)
RDW: 17 % — ABNORMAL HIGH (ref 11.5–15.5)
WBC: 7.7 10*3/uL (ref 4.0–10.5)
nRBC: 0 % (ref 0.0–0.2)

## 2020-11-15 LAB — PHOSPHORUS: Phosphorus: 2.8 mg/dL (ref 2.5–4.6)

## 2020-11-15 LAB — MAGNESIUM: Magnesium: 1.6 mg/dL — ABNORMAL LOW (ref 1.7–2.4)

## 2020-11-15 LAB — COMPREHENSIVE METABOLIC PANEL
ALT: 17 U/L (ref 0–44)
AST: 21 U/L (ref 15–41)
Albumin: 3 g/dL — ABNORMAL LOW (ref 3.5–5.0)
Alkaline Phosphatase: 80 U/L (ref 38–126)
Anion gap: 9 (ref 5–15)
BUN: 17 mg/dL (ref 8–23)
CO2: 30 mmol/L (ref 22–32)
Calcium: 8.9 mg/dL (ref 8.9–10.3)
Chloride: 92 mmol/L — ABNORMAL LOW (ref 98–111)
Creatinine, Ser: 0.76 mg/dL (ref 0.44–1.00)
GFR, Estimated: >60 mL/min (ref >60)
Glucose, Bld: 127 mg/dL — ABNORMAL HIGH (ref 70–99)
Potassium: 3.6 mmol/L (ref 3.5–5.1)
Sodium: 131 mmol/L — ABNORMAL LOW (ref 135–145)
Total Bilirubin: 0.6 mg/dL (ref 0.3–1.2)
Total Protein: 7.3 g/dL (ref 6.5–8.1)

## 2020-11-15 LAB — IRON AND TIBC
Iron: 36 ug/dL (ref 28–170)
Saturation Ratios: 12 % (ref 10.4–31.8)
TIBC: 295 ug/dL (ref 250–450)
UIBC: 259 ug/dL

## 2020-11-15 LAB — RETICULOCYTES
Immature Retic Fract: 17.8 % — ABNORMAL HIGH (ref 2.3–15.9)
RBC.: 4.43 MIL/uL (ref 3.87–5.11)
Retic Count, Absolute: 41.6 10*3/uL (ref 19.0–186.0)
Retic Ct Pct: 0.9 % (ref 0.4–3.1)

## 2020-11-15 LAB — ECHOCARDIOGRAM COMPLETE
Area-P 1/2: 2.99 cm2
Height: 60 in
S' Lateral: 2.9 cm
Weight: 3171.2 oz

## 2020-11-15 LAB — GLUCOSE, CAPILLARY
Glucose-Capillary: 136 mg/dL — ABNORMAL HIGH (ref 70–99)
Glucose-Capillary: 142 mg/dL — ABNORMAL HIGH (ref 70–99)
Glucose-Capillary: 250 mg/dL — ABNORMAL HIGH (ref 70–99)
Glucose-Capillary: 253 mg/dL — ABNORMAL HIGH (ref 70–99)

## 2020-11-15 LAB — VITAMIN B12: Vitamin B-12: 463 pg/mL (ref 180–914)

## 2020-11-15 LAB — FOLATE: Folate: 10.6 ng/mL (ref >5.9)

## 2020-11-15 LAB — FERRITIN: Ferritin: 48 ng/mL (ref 11–307)

## 2020-11-15 MED ORDER — BENZONATATE 100 MG PO CAPS
100.0000 mg | ORAL_CAPSULE | Freq: Three times a day (TID) | ORAL | Status: DC | PRN
  Administered 2020-11-15 – 2020-11-18 (×5): 100 mg via ORAL
  Filled 2020-11-15 (×5): qty 1

## 2020-11-15 MED ORDER — MAGNESIUM SULFATE 2 GM/50ML IV SOLN
2.0000 g | Freq: Once | INTRAVENOUS | Status: AC
  Administered 2020-11-15: 2 g via INTRAVENOUS
  Filled 2020-11-15: qty 50

## 2020-11-15 MED ORDER — GUAIFENESIN-DM 100-10 MG/5ML PO SYRP
10.0000 mL | ORAL_SOLUTION | Freq: Four times a day (QID) | ORAL | Status: DC | PRN
  Administered 2020-11-15 – 2020-11-17 (×3): 10 mL via ORAL
  Filled 2020-11-15 (×3): qty 10

## 2020-11-15 MED ORDER — HYDROCODONE BIT-HOMATROP MBR 5-1.5 MG/5ML PO SOLN
5.0000 mL | Freq: Four times a day (QID) | ORAL | Status: DC | PRN
  Administered 2020-11-15 – 2020-11-17 (×6): 5 mL via ORAL
  Filled 2020-11-15 (×6): qty 5

## 2020-11-15 MED ORDER — PANTOPRAZOLE SODIUM 40 MG PO TBEC
40.0000 mg | DELAYED_RELEASE_TABLET | Freq: Two times a day (BID) | ORAL | Status: DC
  Administered 2020-11-15 – 2020-11-18 (×7): 40 mg via ORAL
  Filled 2020-11-15 (×6): qty 1

## 2020-11-15 MED ORDER — BUDESONIDE 0.25 MG/2ML IN SUSP
0.2500 mg | Freq: Two times a day (BID) | RESPIRATORY_TRACT | Status: DC
  Administered 2020-11-15 – 2020-11-18 (×4): 0.25 mg via RESPIRATORY_TRACT
  Filled 2020-11-15 (×4): qty 2

## 2020-11-15 MED ORDER — METHYLPREDNISOLONE SODIUM SUCC 40 MG IJ SOLR
40.0000 mg | Freq: Two times a day (BID) | INTRAMUSCULAR | Status: AC
  Administered 2020-11-15 – 2020-11-16 (×4): 40 mg via INTRAVENOUS
  Filled 2020-11-15 (×4): qty 1

## 2020-11-15 NOTE — Evaluation (Signed)
Physical Therapy Evaluation Patient Details Name: [name] MRN: [id number] DOB: 12-14-1945 Today's Date: 11/15/2020   History of Present Illness  Patient is a 75 y/o female who presents on 11/14/20 with SOB and cough. Likely with acute bronchitis. PMH includes HTN, COPD, DM, OSA, CHF, epilepsy, pancreatic insufficiency.  Clinical Impression  Patient presents with mild balance deficits and decreased endurance s/p above. Pt lives at home with family and reports being Mod I for ADls, some IADLs and uses SPC vs rollator for ambulation. Reports 1 fall out of bed due to getting dizzy. Today, pt tolerated transfers and ambulation with Min guard progressing to supervision for safety with 1 instance of LOB posteriorly but pt able to catch self without difficulty. 1 standing rest break due to 2/4 DOE. VSS on RA. Pt is functioning close to baseline. Recommend to continue working with mobility tech while in the hospital to maintain mobility/strength with use of SPC. No further skilled therapy services needed. Discharge from therapy.    Follow Up Recommendations No PT follow up;Supervision - Intermittent    Equipment Recommendations  None recommended by PT    Recommendations for Other Services       Precautions / Restrictions Precautions Precautions: Fall Restrictions Weight Bearing Restrictions: No      Mobility  Bed Mobility Overal bed mobility: Needs Assistance Bed Mobility: Supine to Sit;Sit to Supine     Supine to sit: Modified independent (Device/Increase time);HOB elevated Sit to supine: Modified independent (Device/Increase time);HOB elevated   General bed mobility comments: No assist needed.    Transfers Overall transfer level: Needs assistance Equipment used: None Transfers: Sit to/from Stand Sit to Stand: Modified independent (Device/Increase time)         General transfer comment: Stood from EOB x1, no difficulties.  Ambulation/Gait Ambulation/Gait assistance:  Supervision;Min guard Gait Distance (Feet): 350 Feet Assistive device: None Gait Pattern/deviations: Step-through pattern;Decreased stride length;Wide base of support   Gait velocity interpretation: 1.31 - 2.62 ft/sec, indicative of limited community ambulator General Gait Details: Slow, mildly unsteady gait with 1 instance of posterior LOB but able to catch self. 2 standing rest breaks, reaching for rail at times in hallway. Declined using RW, normally uses a cane at home. VSS on RA.  Stairs            Wheelchair Mobility    Modified Rankin (Stroke Patients Only)       Balance Overall balance assessment: Mild deficits observed, not formally tested                                           Pertinent Vitals/Pain Pain Assessment: Faces Faces Pain Scale: Hurts little more Pain Location: chest from coughing Pain Descriptors / Indicators: Discomfort Pain Intervention(s): Monitored during session    Home Living Family/patient expects to be discharged to:: Private residence Living Arrangements: Other relatives (sister, brother in [employer] and childrem) Available Help at Discharge: Family Type of Home: House Home Access: Stairs to enter Entrance Stairs-Rails: None Entrance Stairs-Number of Steps: 3 in front 1 + 1 + in back Home Layout: One level Home Equipment: Tub bench;Cane - single point;Bedside commode;Walker - 4 wheels      Prior Function Level of Independence: Needs assistance   Gait / Transfers Assistance Needed: Uses SPC as needed esp to get OOB and for community ambulation; Uses rollator PRN around house. Sleeps with 4 pillows  to elevate head. Reports fall out of bed, gets dizzy when laying down.  ADL's / Homemaking Assistance Needed: DOes own ADLs. LIght cooking, cleaning, Cares for dog.  Comments: Does not drive.     Hand Dominance   Dominant Hand: Right    Extremity/Trunk Assessment   Upper Extremity Assessment Upper Extremity Assessment:  Defer to OT evaluation    Lower Extremity Assessment Lower Extremity Assessment: RLE deficits/detail;LLE deficits/detail RLE Deficits / Details: flat foot per report RLE Sensation: decreased light touch (toes) LLE Deficits / Details: flat foot per report LLE Sensation: decreased light touch (toes)    Cervical / Trunk Assessment Cervical / Trunk Assessment: Normal  Communication   Communication: No difficulties  Cognition Arousal/Alertness: Awake/alert Behavior During Therapy: WFL for tasks assessed/performed Overall Cognitive Status: Within Functional Limits for tasks assessed                                        General Comments General comments (skin integrity, edema, etc.): Pt with coughing fits during session causing chest discomfort.    Exercises     Assessment/Plan    PT Assessment Patent does not need any further PT services  PT Problem List         PT Treatment Interventions      PT Goals (Current goals can be found in the Care Plan section)  Acute Rehab PT Goals Patient Stated Goal: to get this cough better PT Goal Formulation: All assessment and education complete, DC therapy    Frequency     Barriers to discharge        Co-evaluation               AM-PAC PT "6 Clicks" Mobility  Outcome Measure Help needed turning from your back to your side while in a flat bed without using bedrails?: None Help needed moving from lying on your back to sitting on the side of a flat bed without using bedrails?: None Help needed moving to and from a bed to a chair (including a wheelchair)?: None Help needed standing up from a chair using your arms (e.g., wheelchair or bedside chair)?: None Help needed to walk in hospital room?: A Little Help needed climbing 3-5 steps with a railing? : A Little 6 Click Score: 22    End of Session Equipment Utilized During Treatment: Gait belt Activity Tolerance: Patient tolerated treatment well Patient left:  in bed;with call bell/phone within reach Nurse Communication: Mobility status PT Visit Diagnosis: Unsteadiness on feet (R26.81)    Time: 8280-0349 PT Time Calculation (min) (ACUTE ONLY): 32 min   Charges:   PT Evaluation $PT Eval Moderate Complexity: 1 Mod PT Treatments $Gait Training: 8-22 mins        Marisa Severin, PT, DPT Acute Rehabilitation Services Pager [phone] Office [phone]      Marguarite Arbour A Sabra Heck 11/15/2020, 3:38 PM

## 2020-11-15 NOTE — Progress Notes (Signed)
RT note. Patient refusing CPAP tonight, CPAP is in patients room if she changes her mind. RT will continue to monitor.

## 2020-11-15 NOTE — Evaluation (Signed)
Occupational Therapy Evaluation Patient Details Name: [name] MRN: [id number] DOB: 11/26/1945 Today's Date: 11/15/2020    History of Present Illness 75 y/o female who presents on 11/14/20 with SOB and cough. Likely with acute bronchitis. PMH includes HTN, COPD, DM, OSA, CHF, epilepsy, pancreatic insufficiency.   Clinical Impression   PTA, pt was living with her sister and her family and was independent with ADLs, light IADLs, and caring for her dog; uses a [facility] for mobility. Pt currently performing ADLs at Supervision level and functional mobility in hallway with Min Guard-Min A and single hand held. Pt presenting with decreased activity tolerance and would benefit from further acute OT to provide education on [facility] for ADLs and IADLs. Recommend dc to home once medically stable per physician.     Follow Up Recommendations  No OT follow up    Equipment Recommendations  None recommended by OT    Recommendations for Other Services       Precautions / Restrictions Precautions Precautions: Fall Restrictions Weight Bearing Restrictions: No      Mobility Bed Mobility Overal bed mobility: Needs Assistance Bed Mobility: Supine to Sit;Sit to Supine     Supine to sit: HOB elevated;Supervision Sit to supine: HOB elevated;Supervision   General bed mobility comments: No assist needed.    Transfers Overall transfer level: Needs assistance Equipment used: None Transfers: Sit to/from Stand Sit to Stand: Supervision         General transfer comment: Stood from EOB x1, no difficulties.    Balance Overall balance assessment: Mild deficits observed, not formally tested                                         ADL either performed or assessed with clinical judgement   ADL Overall ADL's : Needs assistance/impaired                                     Functional mobility during ADLs: Min guard;Minimal assistance (single hand held A) General ADL  Comments: Pt performing ADLs and functional mobility at Supervision level. Min Guard-Min A for long distance mobility in hallway as pt wanting to try walking without AE     Vision Baseline Vision/History: Wears glasses Wears Glasses: At all times Patient Visual Report: No change from baseline (Had laser eye sx) Additional Comments: R eye drainage and itching since admission. Have floaters     Perception Perception Perception Tested?: No   Praxis Praxis Praxis tested?: Not tested    Pertinent Vitals/Pain Pain Assessment: Faces Faces Pain Scale: Hurts little more Pain Location: chest from coughing Pain Descriptors / Indicators: Discomfort Pain Intervention(s): Monitored during session;Limited activity within patient's tolerance;Repositioned     Hand Dominance Right   Extremity/Trunk Assessment Upper Extremity Assessment Upper Extremity Assessment: Overall WFL for tasks assessed   Lower Extremity Assessment Lower Extremity Assessment: Defer to PT evaluation RLE Deficits / Details: flat foot per report RLE Sensation: decreased light touch (toes) LLE Deficits / Details: flat foot per report LLE Sensation: decreased light touch (toes)   Cervical / Trunk Assessment Cervical / Trunk Assessment: Normal (Increased body habitus)   Communication Communication Communication: No difficulties   Cognition Arousal/Alertness: Awake/alert Behavior During Therapy: WFL for tasks assessed/performed Overall Cognitive Status: Within Functional Limits for tasks assessed  General Comments: Fatigued but agreeable to therapy   General Comments  VSS on RA    Exercises     Shoulder Instructions      Home Living Family/patient expects to be discharged to:: Private residence Living Arrangements: Other relatives (sister, brother in [employer] and childrem) Available Help at Discharge: Family Type of Home: House Home Access: Stairs to enter [employer] of Steps: 3 in front; 1 + 1 + in back Entrance Stairs-Rails: None Home Layout: One level     Bathroom Shower/Tub: Teacher, early years/pre: Handicapped height (BSC over toilet) Bathroom Accessibility: No   Home Equipment: Tub bench;Cane - single point;Bedside commode;Walker - 4 wheels          Prior Functioning/Environment Level of Independence: Needs assistance  Gait / Transfers Assistance Needed: Uses SPC as needed esp to get OOB and for community ambulation; Uses rollator PRN around house. Sleeps with 4 pillows to elevate head. Reports fall out of bed, gets dizzy when laying down. ADL's / Homemaking Assistance Needed: Does own ADLs. LIght cooking, cleaning, Cares for dog.   Comments: Does not drive.        OT Problem List: Decreased activity tolerance;Impaired balance (sitting and/or standing);Decreased knowledge of use of DME or AE;Decreased knowledge of precautions      OT Treatment/Interventions:      OT Goals(Current goals can be found in the care plan section) Acute Rehab OT Goals Patient Stated Goal: to get this cough better OT Goal Formulation: With patient Time For Goal Achievement: 11/29/20 Potential to Achieve Goals: Good  OT Frequency:     Barriers to D/C:            Co-evaluation              AM-PAC OT "6 Clicks" Daily Activity     Outcome Measure Help from another person eating meals?: A Little Help from another person taking care of personal grooming?: A Little Help from another person toileting, which includes using toliet, bedpan, or urinal?: A Little Help from another person bathing (including washing, rinsing, drying)?: A Little Help from another person to put on and taking off regular upper body clothing?: None Help from another person to put on and taking off regular lower body clothing?: A Little 6 Click Score: 19   End of Session Equipment Utilized During Treatment: Gait belt Nurse Communication: Mobility  status  Activity Tolerance: Patient tolerated treatment well Patient left: in bed;with call bell/phone within reach  OT Visit Diagnosis: Other abnormalities of gait and mobility (R26.89);Unsteadiness on feet (R26.81);Muscle weakness (generalized) (M62.81)                Time: 4765-4650 OT Time Calculation (min): 19 min Charges:  OT General Charges $OT Visit: 1 Visit OT Evaluation $OT Eval Low Complexity: [place], OTR/L Acute Rehab Pager: [phone] Office: [place] 11/15/2020, 4:41 PM

## 2020-11-15 NOTE — Progress Notes (Signed)
PROGRESS NOTE    [name]  GQQ:[id number] DOB: 10-20-1945 DOA: 11/13/2020 PCP: [name], MD  Brief Narrative:  Patient is an obese African-American female with a past medical history significant for but not limited to chronic diastolic CHF/pulm hypertension, diabetes mellitus type 2, seizures, syncope, hypertension as well as other comorbidities who presented to the ER for worsening shortness of breath for a week as well as associated productive cough.  She states that she is having chest pain with coughing otherwise she is chest pain-free.  She states that she has been compliant with her Lasix. She states the cough has been productive with intermittently being discolored and other times being clear. She has multiple family members of her household with similar symptoms.  In the ED she was found to be afebrile and mildly hypoxic requiring supplemental oxygen.  Chest x-ray showed no acute infiltrates and COVID testing was negative but she did have elevated BNP of 164.  Patient's symptoms were improving with nebulized treatments and she was admitted for management of shortness of breath and hypoxia requiring 2 L of supplemental oxygen likely in the setting of bronchitis.  She underwent testing with a respiratory virus panel which was also negative.  She continues to have a significant cough and shortness of breath and states that she feels worse today so we will add inhaled corticosteroids as well as systemic steroids short-term to see.  Chest x-ray did show some volume overload so we will continue diuresis and check an echocardiogram.  Assessment & Plan:   Principal Problem:   SOB (shortness of breath) Active Problems:   Obesity hypoventilation syndrome (HCC)   Chronic pancreatitis (HCC)   Morbid obesity with BMI of 45.0-49.9, adult (HCC)   Type II diabetes mellitus, uncontrolled (HCC)   Seizure disorder (HCC)   Chronic diastolic CHF (congestive heart failure), NYHA class 2 (HCC)    Pulmonary hypertension, unspecified (HCC)   Chronic kidney disease, stage 2 (mild)   Shortness of breath could be from acute bronchitis with cough and sputum production and presently COVID and flu negative.  -Will check other respiratory viral panel and Negative.  -Continue with nebulizer treatment patient did receive Solu-Medrol by the EMS.  -Will resume Solu-Medrol 40 mg every 12 given her diminished breath sounds -We will closely monitor.  -Continue with IV Lasix as below -D-Dimer was 1.21 and will check a VQ Scan given given Contrast shortage and this showed no evidence of any PE -We will change to Xopenex and Atrovent added guaifenesin 1200 mg p.o. twice daily and add Flutter Valve, and Incentive Spirometry; will also add inhaled budesonide -C/w Benzonatate 100 mg po TIDprn Cough and will also add Hycodan -Repeat chest x-ray again in the a.m. and if respiratory status is not improving may consider pulmonary consultation  Chest pain likely from coughing.  -Troponins were negative.  -Presently chest pain-free.  -Chest pain episode and coughing.  Continues to have pretty significant cough -D-Dimer as above  Hypokalemia -Patient's K+ was 3.4 and improved to 3.6 -Continue to Monitor and Replete as Necessary as patient is being diuresed -Repeat CMP in the AM   Hypertension  -C/w Carvedilol 25 mg po BID, Furosemide IV 40 mg BID, Hydralazine 100 mg q8h, Isororbide Mnonitrate 30 mg po Daily -Continue to Monitor BP per Protocol -Last BP Reading was 121/70  Acute on chronic Diastolic CHF and Plmonary Hypertension  -Presently on Lasix and changed to IV 40 mg BID.  -Last EF was 50 to 55%. -She  continues to be dyspneic and has a significant cough -Repeat CXR this AM showed "Cardiomegaly with mild pulmonary venous congestion again noted. Low lung volumes. Interim clearing of pulmonary interstitial prominence suggesting clearing CHF." -Strict I's and O's and Daily Weights -Repeat  ECHOCardiogram -Continue to Monitor for S/Sx of Volume Overload -Repeat CXR in the AM  Hypomagnesemia -Patient's Mag Level was 1.6 -Replete with IV Mag Sulfate 2 grams -Continue to Monitor and Replete as Necessary -Repeat Mag Level in the AM   Depression -C/w Sertraline 50 mg po Daily   GERD -C/w Pantoprazole po 40 mg Daily  Diabetes Mellitus Type 2  -C/w Lantus 25 units sq qHS and with Sensitive  sliding scale coverage. -CBG's ranging from 136-220  History of Seizures  -C/w Lamotrigine 150 mg po BID and Levetiracetam 1500 mg po qHS -C/w Seizure Precautions   Hyponatremia -Mild. Na+ was 131 -In the setting of Diuresis -Continue to Monitor and Trend -Repeat CMP in AN  Hyperlipidemia  -C/w Atorvastatin 10 mg po qHS  Chronic Microcytic Anemia -Patient's Hgb/Hct went from 10.2/32.3 -> 11.0/34.8 -> 10.2/33.0 with MCV of 73.7 -Check Anemia Panel and showed an iron level of 36, U IBC of 259, TIBC of 295, saturation ratios of 12%, ferritin level 48, folate level 10.6 -C/w Ferrous Sulfate 325 mg po BID  -Continue to Monitor for S/Sx of Bleeding; Currently no overt bleeding noted -Repeat CBC in the AM   Obesity Hypoventilation Syndrome -On CPAP at bedtime.  Obesity -Complicates overall prognosis and care -Estimated body mass index is 38.71 kg/m as calculated from the following:   Height as of this encounter: 5' (1.524 m).   Weight as of this encounter: 89.9 kg. -Weight Loss and Dietary Couneling given   DVT prophylaxis: Enoxaparin 40 mg sq q24h Code Status: FULL CODE  Family Communication: No family present at bedside Disposition Plan: When she is improved from a respiratory standpoint and diuresis back to dry weight.  Status is: Inpatient  Remains inpatient appropriate because:Unsafe d/c plan, IV treatments appropriate due to intensity of illness or inability to take PO and Inpatient level of care appropriate due to severity of illness   Dispo: The patient  is from: Home              Anticipated d/c is to: Home              Patient currently is not medically stable to d/c.   Difficult to place patient No  Consultants:   None   Procedures:  ECHOCARDIOGRAM -Done and Pending Read  Antimicrobials:  Anti-infectives (From admission, onward)   None        Subjective: Seen and examined at bedside and thinks that she is doing worse today and still had a very deep cough.  States that she felt better yesterday but yesterday evening her cough significantly worsened and she became more dyspneic and has some wheezing.  No lightheadedness or dizziness.  Continues to have some chest discomfort from coughing.  No other concerns or complaints this time.  Objective: Vitals:   11/15/20 0529 11/15/20 0543 11/15/20 0700 11/15/20 1205  BP: 137/76  139/79 121/70  Pulse: 72  84 65  Resp: 19  18 19   Temp: 98.3 F (36.8 C)  98.4 F (36.9 C) 98.6 F (37 C)  TempSrc: Oral  Oral Oral  SpO2: 98% 98% 94% 97%  Weight: 89.9 kg     Height:        Intake/Output Summary (Last 24 hours)  at 11/15/2020 1611 Last data filed at 11/15/2020 1100 Gross per 24 hour  Intake 487 ml  Output 2050 ml  Net -1563 ml   Filed Weights   11/14/20 0536 11/15/20 0529  Weight: 89.7 kg 89.9 kg   Examination: Physical Exam:  Constitutional: WN/WD obese African-American female currently in mild distress coughing Eyes: Lids and conjunctivae normal, sclerae anicteric  ENMT: External Ears, Nose appear normal. Grossly normal hearing.  Neck: Appears normal, supple, no cervical masses, normal ROM, no appreciable thyromegaly; no JVD Respiratory: Diminished to auscultation bilaterally with coarse breath sounds and some mild crackles and slight wheezing; rales, rhonchi or crackles. Normal respiratory effort and patient is not tachypenic. No accessory muscle use. Not wearing Supplemental O2 via [place] but continues to have a significant cough Cardiovascular: RRR, no murmurs / rubs /  gallops. S1 and S2 auscultated.  Mild lower extremity edema Abdomen: Soft, non-tender, non-distended. Bowel sounds positive.  GU: Deferred. Musculoskeletal: No clubbing / cyanosis of digits/nails. No joint deformity upper and lower extremities.  Skin: No rashes, lesions, ulcers on limited skin evaluation. No induration; Warm and dry.  Neurologic: CN 2-12 grossly intact with no focal deficits. Romberg sign and cerebellar reflexes not assessed.  Psychiatric: Normal judgment and insight. Alert and oriented x 3. Normal mood and appropriate affect.   Data Reviewed: I have personally reviewed following labs and imaging studies  CBC: Recent Labs  Lab 11/13/20 2307 11/14/20 0652 11/15/20 0332  WBC 9.2 8.7 7.7  NEUTROABS 8.2*  --  5.2  HGB 10.2* 11.0* 10.2*  HCT 32.3* 34.8* 33.0*  MCV 72.3* 72.2* 73.7*  PLT 266 331 580   Basic Metabolic Panel: Recent Labs  Lab 11/13/20 2307 11/14/20 0652 11/14/20 0821 11/15/20 0332  NA 136 134*  --  131*  K 3.4* 3.7  --  3.6  CL 98 95*  --  92*  CO2 28 29  --  30  GLUCOSE 200* 247*  --  127*  BUN 7* 8  --  17  CREATININE 0.75 0.71  --  0.76  CALCIUM 8.8* 9.3  --  8.9  MG  --   --  1.8 1.6*  PHOS  --   --  3.7 2.8   GFR: Estimated Creatinine Clearance: 63.5 mL/min (by C-G formula based on SCr of 0.76 mg/dL). Liver Function Tests: Recent Labs  Lab 11/13/20 2307 11/15/20 0332  AST 20 21  ALT 16 17  ALKPHOS 98 80  BILITOT 0.7 0.6  PROT 7.8 7.3  ALBUMIN 3.2* 3.0*   No results for input(s): LIPASE, AMYLASE in the last 168 hours. No results for input(s): AMMONIA in the last 168 hours. Coagulation Profile: No results for input(s): INR, PROTIME in the last 168 hours. Cardiac Enzymes: No results for input(s): CKTOTAL, CKMB, CKMBINDEX, TROPONINI in the last 168 hours. BNP (last 3 results) No results for input(s): PROBNP in the last 8760 hours. HbA1C: No results for input(s): HGBA1C in the last 72 hours. CBG: Recent Labs  Lab  11/14/20 1116 11/14/20 1536 11/14/20 2045 11/15/20 0828 11/15/20 1113  GLUCAP 253* 197* 220* 136* 142*   Lipid Profile: No results for input(s): CHOL, HDL, LDLCALC, TRIG, CHOLHDL, LDLDIRECT in the last 72 hours. Thyroid Function Tests: No results for input(s): TSH, T4TOTAL, FREET4, T3FREE, THYROIDAB in the last 72 hours. Anemia Panel: Recent Labs    11/15/20 0332 11/15/20 0338  VITAMINB12 463  --   FOLATE  --  10.6  FERRITIN 48  --   TIBC  295  --   IRON 36  --   RETICCTPCT  --  0.9   Sepsis Labs: No results for input(s): PROCALCITON, LATICACIDVEN in the last 168 hours.  Recent Results (from the past 240 hour(s))  Novel Coronavirus, NAA ([facility])     Status: None   Collection Time: 11/08/20  2:01 PM   Specimen: Nasopharyngeal(NP) swabs in vial transport medium  Result Value Ref Range Status   SARS-CoV-2, NAA Not Detected Not Detected Final    Comment: This nucleic acid amplification test was developed and its performance characteristics determined by [employer]. Nucleic acid amplification tests include RT-PCR and TMA. This test has not been FDA cleared or approved. This test has been authorized by FDA under an Emergency Use Authorization (EUA). This test is only authorized for the duration of time the declaration that circumstances exist justifying the authorization of the emergency use of in vitro diagnostic tests for detection of SARS-CoV-2 virus and/or diagnosis of COVID-19 infection under section 564(b)(1) of the Act, 21 U.S.C. 132GMW-1(U) (1), unless the authorization is terminated or revoked sooner. When diagnostic testing is negative, the possibility of a false negative result should be considered in the context of a patient's recent exposures and the presence of clinical signs and symptoms consistent with COVID-19. An individual without symptoms of COVID-19 and who is not shedding SARS-CoV-2 virus wo uld expect to have a negative (not detected) result  in this assay.   SARS-COV-2, NAA 2 DAY TAT     Status: None   Collection Time: 11/08/20  2:01 PM  Result Value Ref Range Status   SARS-CoV-2, NAA 2 DAY TAT Performed  Final  Resp Panel by RT-PCR (Flu A&B, Covid) Nasopharyngeal Swab     Status: None   Collection Time: 11/13/20  9:56 PM   Specimen: Nasopharyngeal Swab; Nasopharyngeal(NP) swabs in vial transport medium  Result Value Ref Range Status   SARS Coronavirus 2 by RT PCR NEGATIVE NEGATIVE Final    Comment: (NOTE) SARS-CoV-2 target nucleic acids are NOT DETECTED.  The SARS-CoV-2 RNA is generally detectable in upper respiratory specimens during the acute phase of infection. The lowest concentration of SARS-CoV-2 viral copies this assay can detect is 138 copies/mL. A negative result does not preclude SARS-Cov-2 infection and should not be used as the sole basis for treatment or other patient management decisions. A negative result may occur with  improper specimen collection/handling, submission of specimen other than nasopharyngeal swab, presence of viral mutation(s) within the areas targeted by this assay, and inadequate number of viral copies(<138 copies/mL). A negative result must be combined with clinical observations, patient history, and epidemiological information. The expected result is Negative.  Fact Sheet for Patients:  EntrepreneurPulse.com.au  Fact Sheet for Healthcare Providers:  IncredibleEmployment.be  This test is no t yet approved or cleared by the [place] FDA and  has been authorized for detection and/or diagnosis of SARS-CoV-2 by FDA under an Emergency Use Authorization (EUA). This EUA will remain  in effect (meaning this test can be used) for the duration of the COVID-19 declaration under Section 564(b)(1) of the Act, 21 U.S.C.section 360bbb-3(b)(1), unless the authorization is terminated  or revoked sooner.       Influenza A by PCR NEGATIVE NEGATIVE Final    Influenza B by PCR NEGATIVE NEGATIVE Final    Comment: (NOTE) The Xpert Xpress SARS-CoV-2/FLU/RSV plus assay is intended as an aid in the diagnosis of influenza from Nasopharyngeal swab specimens and should not be used as a sole  basis for treatment. Nasal washings and aspirates are unacceptable for Xpert Xpress SARS-CoV-2/FLU/RSV testing.  Fact Sheet for Patients: EntrepreneurPulse.com.au  Fact Sheet for Healthcare Providers: IncredibleEmployment.be  This test is not yet approved or cleared by the [place] FDA and has been authorized for detection and/or diagnosis of SARS-CoV-2 by FDA under an Emergency Use Authorization (EUA). This EUA will remain in effect (meaning this test can be used) for the duration of the COVID-19 declaration under Section 564(b)(1) of the Act, 21 U.S.C. section 360bbb-3(b)(1), unless the authorization is terminated or revoked.  Performed at [facility], [place] [address] [place], [place] [address]   Respiratory (~20 pathogens) panel by PCR     Status: None   Collection Time: 11/14/20  5:24 AM   Specimen: Nasopharyngeal Swab; Respiratory  Result Value Ref Range Status   Adenovirus NOT DETECTED NOT DETECTED Final   Coronavirus 229E NOT DETECTED NOT DETECTED Final    Comment: (NOTE) The Coronavirus on the Respiratory Panel, DOES NOT test for the novel  Coronavirus (2019 nCoV)    Coronavirus HKU1 NOT DETECTED NOT DETECTED Final   Coronavirus NL63 NOT DETECTED NOT DETECTED Final   Coronavirus OC43 NOT DETECTED NOT DETECTED Final   Metapneumovirus NOT DETECTED NOT DETECTED Final   Rhinovirus / Enterovirus NOT DETECTED NOT DETECTED Final   Influenza A NOT DETECTED NOT DETECTED Final   Influenza B NOT DETECTED NOT DETECTED Final   Parainfluenza Virus 1 NOT DETECTED NOT DETECTED Final   Parainfluenza Virus 2 NOT DETECTED NOT DETECTED Final   Parainfluenza Virus 3 NOT DETECTED NOT DETECTED Final   Parainfluenza  Virus 4 NOT DETECTED NOT DETECTED Final   Respiratory Syncytial Virus NOT DETECTED NOT DETECTED Final   Bordetella pertussis NOT DETECTED NOT DETECTED Final   Bordetella Parapertussis NOT DETECTED NOT DETECTED Final   Chlamydophila pneumoniae NOT DETECTED NOT DETECTED Final   Mycoplasma pneumoniae NOT DETECTED NOT DETECTED Final    Comment: Performed at [facility], [place]. [address] [place], [place] [address]    RN Pressure Injury Documentation:     Estimated body mass index is 38.71 kg/m as calculated from the following:   Height as of this encounter: 5' (1.524 m).   Weight as of this encounter: 89.9 kg.  Malnutrition Type:   Malnutrition Characteristics:   Nutrition Interventions:    Radiology Studies: NM Pulmonary Perfusion  Result Date: 11/14/2020 CLINICAL DATA:  PE suspected, low/intermediate prob, positive D-dimer Shortness of breath. EXAM: NUCLEAR MEDICINE PERFUSION LUNG SCAN TECHNIQUE: Perfusion images were obtained in multiple projections after intravenous injection of radiopharmaceutical. Ventilation scans intentionally deferred if perfusion scan and chest x-ray adequate for interpretation during COVID 19 epidemic. RADIOPHARMACEUTICALS:  4.4 mCi Tc-64m MAA IV COMPARISON:  Chest radiograph yesterday ventilation perfusion scan 07/31/2019 FINDINGS: Homogeneous distribution of radiotracer throughout both lungs. No segmental or peripheral perfusion defects. Exam is unchanged from previous. IMPRESSION: No scintigraphic evidence of pulmonary embolus. Electronically Signed   By: Keith Rake M.D.   On: 11/14/2020 15:41   DG CHEST PORT 1 VIEW  Result Date: 11/15/2020 CLINICAL DATA:  Shortness of breath.  History of COPD and CHF. EXAM: PORTABLE CHEST 1 VIEW COMPARISON:  11/13/2020. FINDINGS: Cardiomegaly with mild pulmonary venous congestion again noted. Low lung volumes. Interim clearing of pulmonary interstitial prominence suggesting clearing CHF. No pleural effusion or  pneumothorax. IMPRESSION: Cardiomegaly with mild pulmonary venous congestion again noted. Low lung volumes. Interim clearing of pulmonary interstitial prominence suggesting clearing CHF. Electronically Signed  By: [place]   On: 11/15/2020 06:25   DG Chest Portable 1 View  Result Date: 11/13/2020 CLINICAL DATA:  Cough EXAM: PORTABLE CHEST 1 VIEW COMPARISON:  03/09/2020 FINDINGS: Lung volumes are small and there is bibasilar atelectasis. No confluent pulmonary infiltrate. No pneumothorax or pleural effusion. Mild cardiomegaly is stable. Enlargement of the central pulmonary arteries is again identified, similar to prior examination and better assessed on prior CT examination of 03/29/2020. No superimposed overt pulmonary edema. No acute bone abnormality. IMPRESSION: Pulmonary hypoinflation Stable cardiomegaly Stable enlargement of the central pulmonary arteries. Electronically Signed   By: Fidela Salisbury MD   On: 11/13/2020 22:42   ECHOCARDIOGRAM COMPLETE  Result Date: 11/15/2020    ECHOCARDIOGRAM REPORT   Patient Name:   [name] [facility] Date of Exam: 11/15/2020 Medical Rec #:  [id number]       Height:       60.0 in Accession #:    [id number]      Weight:       198.2 lb Date of Birth:  [birth year]      BSA:          1.859 m Patient Age:    75 years        BP:           122/79 mmHg Patient Gender: F               HR:           77 bpm. Exam Location:  Inpatient Procedure: 2D Echo, Cardiac Doppler and Color Doppler Indications:    Dyspnea R06.00  History:        Patient has prior history of Echocardiogram examinations, most                 recent 03/21/2020. CHF, Previous Myocardial Infarction, COPD;                 Risk Factors:Hypertension, Diabetes, Sleep Apnea and                 Dyslipidemia. COVID-19. Seizures. CKD.  Sonographer:    Jonelle Sidle Dance Referring Phys: [id number] Georgina Quint LATIF [place]  1. Left ventricular ejection fraction, by estimation, is 60 to 65%. The left ventricle has normal  function. The left ventricle has no regional wall motion abnormalities. There is mild left ventricular hypertrophy. Left ventricular diastolic parameters are consistent with Grade I diastolic dysfunction (impaired relaxation).  2. Right ventricular systolic function is normal. The right ventricular size is normal. There is normal pulmonary artery systolic pressure.  3. Left atrial size was severely dilated.  4. The mitral valve is normal in structure. No evidence of mitral valve regurgitation. No evidence of mitral stenosis.  5. The aortic valve is normal in structure. There is mild calcification of the aortic valve. There is mild thickening of the aortic valve. Aortic valve regurgitation is not visualized. Mild aortic valve sclerosis is present, with no evidence of aortic valve stenosis.  6. The inferior vena cava is normal in size with greater than 50% respiratory variability, suggesting right atrial pressure of 3 mmHg. FINDINGS  Left Ventricle: Left ventricular ejection fraction, by estimation, is 60 to 65%. The left ventricle has normal function. The left ventricle has no regional wall motion abnormalities. The left ventricular internal cavity size was normal in size. There is  mild left ventricular hypertrophy. Left ventricular diastolic parameters are consistent with Grade I diastolic dysfunction (impaired relaxation). Right Ventricle: The right ventricular size is  normal. No increase in right ventricular wall thickness. Right ventricular systolic function is normal. There is normal pulmonary artery systolic pressure. The tricuspid regurgitant velocity is 2.36 m/s, and  with an assumed right atrial pressure of 8 mmHg, the estimated right ventricular systolic pressure is 61.6 mmHg. Left Atrium: Left atrial size was severely dilated. Right Atrium: Right atrial size was normal in size. Pericardium: There is no evidence of pericardial effusion. Mitral Valve: The mitral valve is normal in structure. No evidence of  mitral valve regurgitation. No evidence of mitral valve stenosis. Tricuspid Valve: The tricuspid valve is normal in structure. Tricuspid valve regurgitation is not demonstrated. No evidence of tricuspid stenosis. Aortic Valve: The aortic valve is normal in structure. There is mild calcification of the aortic valve. There is mild thickening of the aortic valve. Aortic valve regurgitation is not visualized. Mild aortic valve sclerosis is present, with no evidence of aortic valve stenosis. Pulmonic Valve: The pulmonic valve was normal in structure. Pulmonic valve regurgitation is trivial. No evidence of pulmonic stenosis. Aorta: The aortic root is normal in size and structure. Venous: The inferior vena cava is normal in size with greater than 50% respiratory variability, suggesting right atrial pressure of 3 mmHg. IAS/Shunts: No atrial level shunt detected by color flow Doppler.  LEFT VENTRICLE PLAX 2D LVIDd:         4.60 cm LVIDs:         2.90 cm LV PW:         1.20 cm LV IVS:        1.10 cm LVOT diam:     2.10 cm LV SV:         70 LV SV Index:   38 LVOT Area:     3.46 cm  RIGHT VENTRICLE          IVC RV Basal diam:  3.80 cm  IVC diam: 2.70 cm RV Mid diam:    2.60 cm TAPSE (M-mode): 1.9 cm LEFT ATRIUM              Index       RIGHT ATRIUM           Index LA diam:        5.40 cm  2.90 cm/m  RA Area:     19.70 cm LA Vol (A2C):   94.2 ml  50.66 ml/m RA Volume:   56.00 ml  30.12 ml/m LA Vol (A4C):   109.0 ml 58.62 ml/m LA Biplane Vol: 103.0 ml 55.39 ml/m  AORTIC VALVE LVOT Vmax:   108.05 cm/s LVOT Vmean:  69.300 cm/s LVOT VTI:    0.202 m  AORTA Ao Root diam: 2.70 cm Ao Asc diam:  3.00 cm MITRAL VALVE               TRICUSPID VALVE MV Area (PHT): 2.99 cm    TR Peak grad:   22.3 mmHg MV Decel Time: 254 msec    TR Vmax:        236.00 cm/s MV E velocity: 94.65 cm/s MV A velocity: 80.90 cm/s  SHUNTS MV E/A ratio:  1.17        Systemic VTI:  0.20 m                            Systemic Diam: 2.10 cm Candee Furbish MD  Electronically signed by Candee Furbish MD Signature Date/Time: 11/15/2020/1:15:09 PM    Final    Scheduled  Meds: . atorvastatin  10 mg Oral QPM  . budesonide (PULMICORT) nebulizer solution  0.25 mg Nebulization BID  . carvedilol  25 mg Oral BID WC  . enoxaparin (LOVENOX) injection  40 mg Subcutaneous Daily  . ferrous sulfate  325 mg Oral BID  . furosemide  40 mg Intravenous BID  . guaiFENesin  1,200 mg Oral BID  . hydrALAZINE  100 mg Oral Q8H  . insulin aspart  0-9 Units Subcutaneous TID WC  . insulin glargine  25 Units Subcutaneous QHS  . isosorbide mononitrate  30 mg Oral Daily  . lamoTRIgine  150 mg Oral BID  . levETIRAcetam  1,500 mg Oral QHS  . lipase/protease/amylase  48,000 Units Oral BID WC   And  . lipase/protease/amylase  24,000 Units Oral Q1200  . methylPREDNISolone (SOLU-MEDROL) injection  40 mg Intravenous Q12H  . pantoprazole  40 mg Oral BID  . potassium chloride SA  40 mEq Oral Daily  . senna-docusate  1 tablet Oral Daily  . sertraline  50 mg Oral Daily   Continuous Infusions:   LOS: 0 days   Kerney Elbe, DO [facility] PAGER is on AMION  If 7PM-7AM, please contact night-coverage [email or web address]

## 2020-11-15 NOTE — Progress Notes (Signed)
  Echocardiogram 2D Echocardiogram has been performed.  [name] 11/15/2020, 11:56 AM

## 2020-11-16 ENCOUNTER — Inpatient Hospital Stay (HOSPITAL_COMMUNITY): Payer: Medicare HMO

## 2020-11-16 LAB — COMPREHENSIVE METABOLIC PANEL
ALT: 20 U/L (ref 0–44)
AST: 26 U/L (ref 15–41)
Albumin: 3.2 g/dL — ABNORMAL LOW (ref 3.5–5.0)
Alkaline Phosphatase: 92 U/L (ref 38–126)
Anion gap: 11 (ref 5–15)
BUN: 20 mg/dL (ref 8–23)
CO2: 30 mmol/L (ref 22–32)
Calcium: 9.2 mg/dL (ref 8.9–10.3)
Chloride: 89 mmol/L — ABNORMAL LOW (ref 98–111)
Creatinine, Ser: 0.95 mg/dL (ref 0.44–1.00)
GFR, Estimated: >60 mL/min (ref >60)
Glucose, Bld: 223 mg/dL — ABNORMAL HIGH (ref 70–99)
Potassium: 4.3 mmol/L (ref 3.5–5.1)
Sodium: 130 mmol/L — ABNORMAL LOW (ref 135–145)
Total Bilirubin: 0.6 mg/dL (ref 0.3–1.2)
Total Protein: 7.6 g/dL (ref 6.5–8.1)

## 2020-11-16 LAB — CBC WITH DIFFERENTIAL/PLATELET
Abs Immature Granulocytes: 0 10*3/uL (ref 0.00–0.07)
Basophils Absolute: 0 10*3/uL (ref 0.0–0.1)
Basophils Relative: 0 %
Eosinophils Absolute: 0 10*3/uL (ref 0.0–0.5)
Eosinophils Relative: 0 %
HCT: 34.1 % — ABNORMAL LOW (ref 36.0–46.0)
Hemoglobin: 10.7 g/dL — ABNORMAL LOW (ref 12.0–15.0)
Lymphocytes Relative: 13 %
Lymphs Abs: 1 10*3/uL (ref 0.7–4.0)
MCH: 22.7 pg — ABNORMAL LOW (ref 26.0–34.0)
MCHC: 31.4 g/dL (ref 30.0–36.0)
MCV: 72.2 fL — ABNORMAL LOW (ref 80.0–100.0)
Monocytes Absolute: 0.6 10*3/uL (ref 0.1–1.0)
Monocytes Relative: 7 %
Neutro Abs: 6.3 10*3/uL (ref 1.7–7.7)
Neutrophils Relative %: 80 %
Platelets: 350 10*3/uL (ref 150–400)
RBC: 4.72 MIL/uL (ref 3.87–5.11)
RDW: 16.8 % — ABNORMAL HIGH (ref 11.5–15.5)
WBC: 7.9 10*3/uL (ref 4.0–10.5)
nRBC: 0 % (ref 0.0–0.2)
nRBC: 1 /100 WBC — ABNORMAL HIGH

## 2020-11-16 LAB — MAGNESIUM: Magnesium: 2 mg/dL (ref 1.7–2.4)

## 2020-11-16 LAB — GLUCOSE, CAPILLARY
Glucose-Capillary: 310 mg/dL — ABNORMAL HIGH (ref 70–99)
Glucose-Capillary: 147 mg/dL — ABNORMAL HIGH (ref 70–99)
Glucose-Capillary: 279 mg/dL — ABNORMAL HIGH (ref 70–99)
Glucose-Capillary: 180 mg/dL — ABNORMAL HIGH (ref 70–99)

## 2020-11-16 LAB — PHOSPHORUS: Phosphorus: 3.8 mg/dL (ref 2.5–4.6)

## 2020-11-16 MED ORDER — INSULIN ASPART 100 UNIT/ML IJ SOLN
0.0000 [IU] | Freq: Three times a day (TID) | INTRAMUSCULAR | Status: DC
  Administered 2020-11-16 – 2020-11-17 (×2): 8 [IU] via SUBCUTANEOUS
  Administered 2020-11-17: 5 [IU] via SUBCUTANEOUS
  Administered 2020-11-17: 2 [IU] via SUBCUTANEOUS
  Administered 2020-11-18: 3 [IU] via SUBCUTANEOUS

## 2020-11-16 MED ORDER — LEVOFLOXACIN 750 MG PO TABS
750.0000 mg | ORAL_TABLET | Freq: Every day | ORAL | Status: DC
  Administered 2020-11-16 – 2020-11-17 (×2): 750 mg via ORAL
  Filled 2020-11-16 (×3): qty 1

## 2020-11-16 MED ORDER — INSULIN GLARGINE 100 UNIT/ML ~~LOC~~ SOLN
30.0000 [IU] | Freq: Every day | SUBCUTANEOUS | Status: DC
  Administered 2020-11-16 – 2020-11-17 (×2): 30 [IU] via SUBCUTANEOUS
  Filled 2020-11-16 (×3): qty 0.3

## 2020-11-16 MED ORDER — INSULIN ASPART 100 UNIT/ML IJ SOLN
0.0000 [IU] | Freq: Every day | INTRAMUSCULAR | Status: DC
  Administered 2020-11-17: 4 [IU] via SUBCUTANEOUS

## 2020-11-16 MED ORDER — PREDNISONE 20 MG PO TABS
40.0000 mg | ORAL_TABLET | Freq: Every day | ORAL | Status: DC
  Administered 2020-11-17 – 2020-11-18 (×2): 40 mg via ORAL
  Filled 2020-11-16 (×2): qty 2

## 2020-11-16 MED ORDER — INSULIN ASPART 100 UNIT/ML IJ SOLN
3.0000 [IU] | Freq: Three times a day (TID) | INTRAMUSCULAR | Status: DC
  Administered 2020-11-16 – 2020-11-18 (×5): 3 [IU] via SUBCUTANEOUS

## 2020-11-16 NOTE — Progress Notes (Signed)
Mobility Specialist - Progress Note   11/16/20 1521  Mobility  Activity Ambulated in hall  Level of Assistance Contact guard assist, steadying assist  Assistive Device None  Distance Ambulated (ft) 100 ft  Mobility Ambulated with assistance in hallway  Mobility Response Tolerated well  Mobility performed by Mobility specialist  $Mobility charge 1 Mobility   Pt had one episode of posterior LOB, was able to correct herself w/o assist. Distance limited so that pt may work w/ OT as well. Pt left sitting up on edge of bed, call bell at side. VSS throughout.   Pricilla Handler Mobility Specialist Mobility Specialist Phone: [phone]

## 2020-11-16 NOTE — Progress Notes (Signed)
Occupational Therapy Treatment Patient Details Name: [name] MRN: [id number] DOB: 06-28-1945 Today's Date: 11/16/2020    History of present illness 75 y/o female who presents on 11/14/20 with SOB and cough. Likely with acute bronchitis. PMH includes HTN, COPD, DM, OSA, CHF, epilepsy, pancreatic insufficiency.   OT comments  Pt completed all OT goals this session. Pt ambulated in the room with no DME, completed toileting and grooming standing at the sink with no physical assistance and maintained safety throughout. Pt was then educated on Energy conservation with a worksheet provided, pt agreeable to education and identified 3 techniques she is interested in trying at home. Pt reports that she feels all her needs are met and she has no further questions or concerns for OT. Acute OT will sign off.    Follow Up Recommendations  No OT follow up    Equipment Recommendations  None recommended by OT    Recommendations for Other Services      Precautions / Restrictions Precautions Precautions: Fall Restrictions Weight Bearing Restrictions: No       Mobility Bed Mobility               General bed mobility comments: Sitting EOB on Entry'    Transfers Overall transfer level: Modified independent Equipment used: None Transfers: Sit to/from Stand Sit to Stand: Modified independent (Device/Increase time)         General transfer comment: Stood EOB and from raised toilet, no difficulties    Balance Overall balance assessment: Mild deficits observed, not formally tested                                         ADL either performed or assessed with clinical judgement   ADL Overall ADL's : Needs assistance/impaired     Grooming: Wash/dry hands;Wash/dry face;Standing;Modified independent Grooming Details (indicate cue type and reason): completed standing at sink                 Toilet Transfer: Modified Independent;Ambulation;Comfort height  toilet;Grab bars Toilet Transfer Details (indicate cue type and reason): walked into bathroom and completed toileting Toileting- Clothing Manipulation and Hygiene: Modified independent;Sitting/lateral lean;Sit to/from stand       Functional mobility during ADLs: Modified independent General ADL Comments: Pt completed functional mobility and ADL's with no DME. Pt at mod I level for all ADL's, needing no physical assist and performing ADL's safely.     Vision       Perception     Praxis      Cognition Arousal/Alertness: Awake/alert Behavior During Therapy: WFL for tasks assessed/performed Overall Cognitive Status: Within Functional Limits for tasks assessed                                          Exercises     Shoulder Instructions       General Comments VSS on RA, Pt is experiencing coughing fits during session    Pertinent Vitals/ Pain       Pain Assessment: No/denies pain  Home Living                                          Prior Functioning/Environment  Frequency  Min 2X/week        Progress Toward Goals  OT Goals(current goals can now be found in the care plan section)  Progress towards OT goals: Goals met/education completed, patient discharged from OT  Acute Rehab OT Goals Patient Stated Goal: to get this cough better OT Goal Formulation: With patient Time For Goal Achievement: 11/16/20 Potential to Achieve Goals: Good ADL Goals Additional ADL Goal #1: Pt will perform ADLs at Mod I level with rest breaks as needed Additional ADL Goal #2: Pt will independently verbalize three energy conservation techniques for ADLs and IADLs  Plan All goals met and education completed, patient discharged from OT services    Co-evaluation                 AM-PAC OT "6 Clicks" Daily Activity     Outcome Measure   Help from another person eating meals?: None Help from another person taking care of  personal grooming?: None Help from another person toileting, which includes using toliet, bedpan, or urinal?: None Help from another person bathing (including washing, rinsing, drying)?: None Help from another person to put on and taking off regular upper body clothing?: None Help from another person to put on and taking off regular lower body clothing?: None 6 Click Score: 24    End of Session        Activity Tolerance Patient tolerated treatment well   Patient Left in bed;with call bell/phone within reach   Nurse Communication Mobility status        Time: 6553-7482 OT Time Calculation (min): 15 min  Charges: OT General Charges $OT Visit: 1 Visit OT Treatments $Self Care/Home Management : 8-22 mins  [name] Ganaway H., OTR/L Acute Rehabilitation  [name] [name] 11/16/2020, 5:17 PM

## 2020-11-16 NOTE — Progress Notes (Signed)
Patient has refused CPAP for multiple nights in a row.  Patient prefers her home mask (nasal pillows).  CPAP removed from patients room.  Patient may call RT if she changes her mind.

## 2020-11-16 NOTE — Progress Notes (Signed)
Inpatient Diabetes Program Recommendations  AACE/ADA: New Consensus Statement on Inpatient Glycemic Control   Target Ranges:  Prepandial:   less than 140 mg/dL      Peak postprandial:   less than 180 mg/dL (1-2 hours)      Critically ill patients:  140 - 180 mg/dL   Results for [name], [name] (MRN [id number]) as of 11/16/2020 11:41  Ref. Range 11/15/2020 08:28 11/15/2020 11:13 11/15/2020 17:20 11/15/2020 20:18 11/16/2020 08:56  Glucose-Capillary Latest Ref Range: 70 - 99 mg/dL 136 (H) 142 (H) 250 (H) 253 (H) 310 (H)   Review of Glycemic Control  Diabetes history: DM2 Outpatient Diabetes medications: Toujeo 25 units QHS, Humalog 2-4 units TID with meals, Ozempic 1 mg Qweek Current orders for Inpatient glycemic control: Lantus 25 units QHS, Novolog 0-9 units TID with meals; Solumedrol 40 mg Q12H  Inpatient Diabetes Program Recommendations:    Insulin: If steroids are continued as ordered, please consider increasieng Lantus to 30 units QHS, adding Novolog 0-5 units QHS for bedtime correction, and ordering Novolog 3 units TID with meals for meal coverage if patient eats at least 50% of meals.  Thanks, Barnie Alderman, RN, MSN, CDE Diabetes Coordinator Inpatient Diabetes Program [phone] (Team Pager from 8am to 5pm)

## 2020-11-16 NOTE — Progress Notes (Signed)
SATURATION QUALIFICATIONS: (This note is used to comply with regulatory documentation for home oxygen)  Patient Saturations on Room Air at Rest = 95%  Patient Saturations on Room Air while Ambulating = 94%   

## 2020-11-16 NOTE — Progress Notes (Signed)
PROGRESS NOTE    [name]  XBD:[id number] DOB: [birth year] DOA: 11/13/2020 PCP: [name], MD    Brief Narrative:  [name] is a 75 year old female with past medical history significant for chronic diastolic congestive heart failure, essential hypertension, pulmonary hypertension, type 2 diabetes mellitus, seizure disorder who presents to [facility] ED on 5/22 with progressive shortness of breath, continued cough.  Patient reports cough productive of sputum, yellow/white in color.  Patient reports other family members with similar type symptoms.  Patient has been given cough medicine and completed 5-day course of Z-Pak outpatient without much improvement.  In the ED, patient is afebrile.  Sodium 136, potassium 3.4, chloride 98, CO2 28, glucose 200, BUN 7, creatinine 0.75.  BNP 164.4.  High sensitive troponin 11>9.  WBC 9.2, hemoglobin 10.2, platelets 266.  Influenza a/B PCR negative.  SARS-CoV-2 PCR negative.  D-dimer elevated 1.21.  Chest x-ray with pulmonary hypoinflation, cardiomegaly and stable enlargement of the central pulmonary arteries.  Patient was given neb treatments in the ED and IV steroids.  TRH consulted for further management and assessment of dyspnea secondary to bronchitis causing hypoxia.   Assessment & Plan:   Principal Problem:   SOB (shortness of breath) Active Problems:   Obesity hypoventilation syndrome (HCC)   Chronic pancreatitis (HCC)   Morbid obesity with BMI of 45.0-49.9, adult (HCC)   Type II diabetes mellitus, uncontrolled (HCC)   Seizure disorder (HCC)   Chronic diastolic CHF (congestive heart failure), NYHA class 2 (HCC)   Pulmonary hypertension, unspecified (HCC)   Chronic kidney disease, stage 2 (mild)   Acute hypoxic respiratory failure, POA Subacute bronchitis Patient presenting with progressive shortness of breath associated with productive cough.  COVID-19/influenza A/B PCR negative.  Respiratory viral panel unrevealing.  Chest  x-ray with no focal consolidation.  Elevated D-dimer but nuclear medicine VQ scan with no scintigraphic evidence of pulmonary embolism.  Failed outpatient treatment with cough medicine and 5-day course of Z-Pak. --Levaquin 750 mg p.o. every 24 hours x7 days --Transition Solu-Medrol to prednisone 40 mg p.o. daily tomorrow --Budesonide neb twice daily --Mucinex 1200 mg p.o. twice daily --Robitussin-DM every 6 hours as needed cough --Tessalon 100 mg p.o. 3 times daily as needed cough not relieved by Robitussin --Hycodan 5 mL every 6 hours as needed severe cough --Albuterol neb every 2 hours as needed wheezing/shortness of breath  Acute on chronic diastolic congestive heart failure Patient presenting with progressive shortness of breath.  Elevated BNP.  TTE 5/24 with LVEF 60 to 65%, mild LVH, grade 1 diastolic dysfunction, LA severely dilated, IVC normal in size.  Likely component of her underlying shortness of breath/cough and respiratory failure as above. --Furosemide 40 mg IV every 12 hours --Carvedilol 25 mg p.o. twice daily --Isosorbide mononitrate 30 mg p.o. daily --Strict I's and O's and daily weights --BMP daily  Type 2 diabetes mellitus with hyperglycemia Hemoglobin A1c 7.7, not optimally controlled.  Home regimen includes Toujeo 25u qHS, Novolog ISS, and Ozempic --Lantus 30 units subcutaneously nightly --NovoLog 3 units 3 times daily AC --Moderate dose SSI for further coverage --CBGs before every meal/at bedtime  Pancreatic insufficiency: Continue Creon  GERD: Continue PPI  Depression/anxiety: Sertraline 50 mg p.o. daily  Essential hypertension --Carvedilol 25 mg p.o. twice daily --Hydralazine 20 mg p.o. 3 times daily --IV Lasix as above  History of seizure disorder --Lamictal 150 mg p.o. BID --Keppra 1500 mg p.o. nightly  Hyperlipidemia: Atorvastatin 10 mg p.o. daily  OSA/obesity hypoventilation syndrome -- Nocturnal CPAP  Morbid obesity Body mass index is 38.36  kg/m.  Discussed with patient needs for aggressive lifestyle changes/weight loss as this complicates all facets of care.  Outpatient follow-up with PCP.     DVT prophylaxis: Lovenox   Code Status: Full Code Family Communication: No family present at bedside this morning  Disposition Plan:  Level of care: Telemetry Medical Status is: Inpatient  Remains inpatient appropriate because:Ongoing diagnostic testing needed not appropriate for outpatient work up, Unsafe d/c plan, IV treatments appropriate due to intensity of illness or inability to take PO and Inpatient level of care appropriate due to severity of illness   Dispo: The patient is from: Home              Anticipated d/c is to: Home              Patient currently is not medically stable to d/c.   Difficult to place patient No   Consultants:   none  Procedures:   TTE  Antimicrobials:   Levaquin 5/25>>   Subjective: Patient seen examined at bedside, resting comfortably.  Sitting in bedside chair.  Continues with intermittent productive cough.  Now titrated off of supple oxygen.  No other specific complaints or concerns at this time.  Denies headache, no dizziness, no chest pain, no abdominal pain, no fever/chills/night sweats, no nausea cefonicid diarrhea, no weakness, no fatigue, no paresthesias.  No acute events overnight per nursing staff.  Objective: Vitals:   11/15/20 2212 11/15/20 2253 11/16/20 0437 11/16/20 0846  BP: (!) 110/58 125/73 137/74 (!) 104/92  Pulse:  72 71 62  Resp:  19 19 19   Temp:  98 F (36.7 C) 98.2 F (36.8 C) 98.2 F (36.8 C)  TempSrc:  Oral Oral Oral  SpO2:  94% 95% 98%  Weight:   89.1 kg   Height:        Intake/Output Summary (Last 24 hours) at 11/16/2020 1442 Last data filed at 11/16/2020 0900 Gross per 24 hour  Intake 1320 ml  Output 2150 ml  Net -830 ml   Filed Weights   11/14/20 0536 11/15/20 0529 11/16/20 0437  Weight: 89.7 kg 89.9 kg 89.1 kg    Examination:  General  exam: Appears calm and comfortable, intermittent coughing fits Respiratory system: Clear to auscultation. Respiratory effort normal.  On room air Cardiovascular system: S1 & S2 heard, RRR. No JVD, murmurs, rubs, gallops or clicks. No pedal edema. Gastrointestinal system: Abdomen is nondistended, soft and nontender. No organomegaly or masses felt. Normal bowel sounds heard. Central nervous system: Alert and oriented. No focal neurological deficits. Extremities: Symmetric 5 x 5 power. Skin: No rashes, lesions or ulcers Psychiatry: Judgement and insight appear normal. Mood & affect appropriate.     Data Reviewed: I have personally reviewed following labs and imaging studies  CBC: Recent Labs  Lab 11/13/20 2307 11/14/20 0652 11/15/20 0332 11/16/20 0043  WBC 9.2 8.7 7.7 7.9  NEUTROABS 8.2*  --  5.2 6.3  HGB 10.2* 11.0* 10.2* 10.7*  HCT 32.3* 34.8* 33.0* 34.1*  MCV 72.3* 72.2* 73.7* 72.2*  PLT 266 331 287 585   Basic Metabolic Panel: Recent Labs  Lab 11/13/20 2307 11/14/20 0652 11/14/20 0821 11/15/20 0332 11/16/20 0043  NA 136 134*  --  131* 130*  K 3.4* 3.7  --  3.6 4.3  CL 98 95*  --  92* 89*  CO2 28 29  --  30 30  GLUCOSE 200* 247*  --  127* 223*  BUN 7*  8  --  17 20  CREATININE 0.75 0.71  --  0.76 0.95  CALCIUM 8.8* 9.3  --  8.9 9.2  MG  --   --  1.8 1.6* 2.0  PHOS  --   --  3.7 2.8 3.8   GFR: Estimated Creatinine Clearance: 53.2 mL/min (by C-G formula based on SCr of 0.95 mg/dL). Liver Function Tests: Recent Labs  Lab 11/13/20 2307 11/15/20 0332 11/16/20 0043  AST 20 21 26   ALT 16 17 20   ALKPHOS 98 80 92  BILITOT 0.7 0.6 0.6  PROT 7.8 7.3 7.6  ALBUMIN 3.2* 3.0* 3.2*   No results for input(s): LIPASE, AMYLASE in the last 168 hours. No results for input(s): AMMONIA in the last 168 hours. Coagulation Profile: No results for input(s): INR, PROTIME in the last 168 hours. Cardiac Enzymes: No results for input(s): CKTOTAL, CKMB, CKMBINDEX, TROPONINI in the  last 168 hours. BNP (last 3 results) No results for input(s): PROBNP in the last 8760 hours. HbA1C: No results for input(s): HGBA1C in the last 72 hours. CBG: Recent Labs  Lab 11/15/20 1113 11/15/20 1720 11/15/20 2018 11/16/20 0856 11/16/20 1154  GLUCAP 142* 250* 253* 310* 147*   Lipid Profile: No results for input(s): CHOL, HDL, LDLCALC, TRIG, CHOLHDL, LDLDIRECT in the last 72 hours. Thyroid Function Tests: No results for input(s): TSH, T4TOTAL, FREET4, T3FREE, THYROIDAB in the last 72 hours. Anemia Panel: Recent Labs    11/15/20 0332 11/15/20 0338  VITAMINB12 463  --   FOLATE  --  10.6  FERRITIN 48  --   TIBC 295  --   IRON 36  --   RETICCTPCT  --  0.9   Sepsis Labs: No results for input(s): PROCALCITON, LATICACIDVEN in the last 168 hours.  Recent Results (from the past 240 hour(s))  Novel Coronavirus, NAA ([facility])     Status: None   Collection Time: 11/08/20  2:01 PM   Specimen: Nasopharyngeal(NP) swabs in vial transport medium  Result Value Ref Range Status   SARS-CoV-2, NAA Not Detected Not Detected Final    Comment: This nucleic acid amplification test was developed and its performance characteristics determined by [employer]. Nucleic acid amplification tests include RT-PCR and TMA. This test has not been FDA cleared or approved. This test has been authorized by FDA under an Emergency Use Authorization (EUA). This test is only authorized for the duration of time the declaration that circumstances exist justifying the authorization of the emergency use of in vitro diagnostic tests for detection of SARS-CoV-2 virus and/or diagnosis of COVID-19 infection under section 564(b)(1) of the Act, 21 U.S.C. 979GXQ-1(J) (1), unless the authorization is terminated or revoked sooner. When diagnostic testing is negative, the possibility of a false negative result should be considered in the context of a patient's recent exposures and the presence of clinical  signs and symptoms consistent with COVID-19. An individual without symptoms of COVID-19 and who is not shedding SARS-CoV-2 virus wo uld expect to have a negative (not detected) result in this assay.   SARS-COV-2, NAA 2 DAY TAT     Status: None   Collection Time: 11/08/20  2:01 PM  Result Value Ref Range Status   SARS-CoV-2, NAA 2 DAY TAT Performed  Final  Resp Panel by RT-PCR (Flu A&B, Covid) Nasopharyngeal Swab     Status: None   Collection Time: 11/13/20  9:56 PM   Specimen: Nasopharyngeal Swab; Nasopharyngeal(NP) swabs in vial transport medium  Result Value Ref Range Status   SARS  Coronavirus 2 by RT PCR NEGATIVE NEGATIVE Final    Comment: (NOTE) SARS-CoV-2 target nucleic acids are NOT DETECTED.  The SARS-CoV-2 RNA is generally detectable in upper respiratory specimens during the acute phase of infection. The lowest concentration of SARS-CoV-2 viral copies this assay can detect is 138 copies/mL. A negative result does not preclude SARS-Cov-2 infection and should not be used as the sole basis for treatment or other patient management decisions. A negative result may occur with  improper specimen collection/handling, submission of specimen other than nasopharyngeal swab, presence of viral mutation(s) within the areas targeted by this assay, and inadequate number of viral copies(<138 copies/mL). A negative result must be combined with clinical observations, patient history, and epidemiological information. The expected result is Negative.  Fact Sheet for Patients:  EntrepreneurPulse.com.au  Fact Sheet for Healthcare Providers:  IncredibleEmployment.be  This test is no t yet approved or cleared by the [place] FDA and  has been authorized for detection and/or diagnosis of SARS-CoV-2 by FDA under an Emergency Use Authorization (EUA). This EUA will remain  in effect (meaning this test can be used) for the duration of the COVID-19  declaration under Section 564(b)(1) of the Act, 21 U.S.C.section 360bbb-3(b)(1), unless the authorization is terminated  or revoked sooner.       Influenza A by PCR NEGATIVE NEGATIVE Final   Influenza B by PCR NEGATIVE NEGATIVE Final    Comment: (NOTE) The Xpert Xpress SARS-CoV-2/FLU/RSV plus assay is intended as an aid in the diagnosis of influenza from Nasopharyngeal swab specimens and should not be used as a sole basis for treatment. Nasal washings and aspirates are unacceptable for Xpert Xpress SARS-CoV-2/FLU/RSV testing.  Fact Sheet for Patients: EntrepreneurPulse.com.au  Fact Sheet for Healthcare Providers: IncredibleEmployment.be  This test is not yet approved or cleared by the [place] FDA and has been authorized for detection and/or diagnosis of SARS-CoV-2 by FDA under an Emergency Use Authorization (EUA). This EUA will remain in effect (meaning this test can be used) for the duration of the COVID-19 declaration under Section 564(b)(1) of the Act, 21 U.S.C. section 360bbb-3(b)(1), unless the authorization is terminated or revoked.  Performed at [facility], [place] [address] [place], [place] [address]   Respiratory (~20 pathogens) panel by PCR     Status: None   Collection Time: 11/14/20  5:24 AM   Specimen: Nasopharyngeal Swab; Respiratory  Result Value Ref Range Status   Adenovirus NOT DETECTED NOT DETECTED Final   Coronavirus 229E NOT DETECTED NOT DETECTED Final    Comment: (NOTE) The Coronavirus on the Respiratory Panel, DOES NOT test for the novel  Coronavirus (2019 nCoV)    Coronavirus HKU1 NOT DETECTED NOT DETECTED Final   Coronavirus NL63 NOT DETECTED NOT DETECTED Final   Coronavirus OC43 NOT DETECTED NOT DETECTED Final   Metapneumovirus NOT DETECTED NOT DETECTED Final   Rhinovirus / Enterovirus NOT DETECTED NOT DETECTED Final   Influenza A NOT DETECTED NOT DETECTED Final   Influenza B NOT DETECTED NOT  DETECTED Final   Parainfluenza Virus 1 NOT DETECTED NOT DETECTED Final   Parainfluenza Virus 2 NOT DETECTED NOT DETECTED Final   Parainfluenza Virus 3 NOT DETECTED NOT DETECTED Final   Parainfluenza Virus 4 NOT DETECTED NOT DETECTED Final   Respiratory Syncytial Virus NOT DETECTED NOT DETECTED Final   Bordetella pertussis NOT DETECTED NOT DETECTED Final   Bordetella Parapertussis NOT DETECTED NOT DETECTED Final   Chlamydophila pneumoniae NOT DETECTED NOT DETECTED Final   Mycoplasma pneumoniae NOT  DETECTED NOT DETECTED Final    Comment: Performed at [facility], [place] [address], [place], [place] [address]         Radiology Studies: NM Pulmonary Perfusion  Result Date: 11/14/2020 CLINICAL DATA:  PE suspected, low/intermediate prob, positive D-dimer Shortness of breath. EXAM: NUCLEAR MEDICINE PERFUSION LUNG SCAN TECHNIQUE: Perfusion images were obtained in multiple projections after intravenous injection of radiopharmaceutical. Ventilation scans intentionally deferred if perfusion scan and chest x-ray adequate for interpretation during COVID 19 epidemic. RADIOPHARMACEUTICALS:  4.4 mCi Tc-39m MAA IV COMPARISON:  Chest radiograph yesterday ventilation perfusion scan 07/31/2019 FINDINGS: Homogeneous distribution of radiotracer throughout both lungs. No segmental or peripheral perfusion defects. Exam is unchanged from previous. IMPRESSION: No scintigraphic evidence of pulmonary embolus. Electronically Signed   By: Keith Rake M.D.   On: 11/14/2020 15:41   DG CHEST PORT 1 VIEW  Result Date: 11/16/2020 CLINICAL DATA:  Dyspnea EXAM: PORTABLE CHEST 1 VIEW COMPARISON:  11/15/2020 FINDINGS: Lungs volumes are small, but are symmetric and are clear. No pneumothorax or pleural effusion. Mild cardiomegaly is stable. The central pulmonary arteries are enlarged, unchanged. Osseous structures are age-appropriate. No acute bone abnormality. IMPRESSION: Progressive pulmonary hypoinflation. Stable  cardiomegaly. Enlargement of the central pulmonary arteries Electronically Signed   By: Fidela Salisbury MD   On: 11/16/2020 03:33   DG CHEST PORT 1 VIEW  Result Date: 11/15/2020 CLINICAL DATA:  Shortness of breath.  History of COPD and CHF. EXAM: PORTABLE CHEST 1 VIEW COMPARISON:  11/13/2020. FINDINGS: Cardiomegaly with mild pulmonary venous congestion again noted. Low lung volumes. Interim clearing of pulmonary interstitial prominence suggesting clearing CHF. No pleural effusion or pneumothorax. IMPRESSION: Cardiomegaly with mild pulmonary venous congestion again noted. Low lung volumes. Interim clearing of pulmonary interstitial prominence suggesting clearing CHF. Electronically Signed   By: Marcello Moores  Register   On: 11/15/2020 06:25   ECHOCARDIOGRAM COMPLETE  Result Date: 11/15/2020    ECHOCARDIOGRAM REPORT   Patient Name:   JANEE URESTE [facility] Date of Exam: 11/15/2020 Medical Rec #:  [id number]       Height:       60.0 in Accession #:    [id number]      Weight:       198.2 lb Date of Birth:  [birth year]      BSA:          1.859 m Patient Age:    79 years        BP:           122/79 mmHg Patient Gender: F               HR:           77 bpm. Exam Location:  Inpatient Procedure: 2D Echo, Cardiac Doppler and Color Doppler Indications:    Dyspnea R06.00  History:        Patient has prior history of Echocardiogram examinations, most                 recent 03/21/2020. CHF, Previous Myocardial Infarction, COPD;                 Risk Factors:Hypertension, Diabetes, Sleep Apnea and                 Dyslipidemia. COVID-19. Seizures. CKD.  Sonographer:    Jonelle Sidle Dance Referring Phys: [id number] Georgina Quint LATIF [place]  1. Left ventricular ejection fraction, by estimation, is 60 to 65%. The left ventricle has normal function. The left ventricle has no  regional wall motion abnormalities. There is mild left ventricular hypertrophy. Left ventricular diastolic parameters are consistent with Grade I diastolic dysfunction  (impaired relaxation).  2. Right ventricular systolic function is normal. The right ventricular size is normal. There is normal pulmonary artery systolic pressure.  3. Left atrial size was severely dilated.  4. The mitral valve is normal in structure. No evidence of mitral valve regurgitation. No evidence of mitral stenosis.  5. The aortic valve is normal in structure. There is mild calcification of the aortic valve. There is mild thickening of the aortic valve. Aortic valve regurgitation is not visualized. Mild aortic valve sclerosis is present, with no evidence of aortic valve stenosis.  6. The inferior vena cava is normal in size with greater than 50% respiratory variability, suggesting right atrial pressure of 3 mmHg. FINDINGS  Left Ventricle: Left ventricular ejection fraction, by estimation, is 60 to 65%. The left ventricle has normal function. The left ventricle has no regional wall motion abnormalities. The left ventricular internal cavity size was normal in size. There is  mild left ventricular hypertrophy. Left ventricular diastolic parameters are consistent with Grade I diastolic dysfunction (impaired relaxation). Right Ventricle: The right ventricular size is normal. No increase in right ventricular wall thickness. Right ventricular systolic function is normal. There is normal pulmonary artery systolic pressure. The tricuspid regurgitant velocity is 2.36 m/s, and  with an assumed right atrial pressure of 8 mmHg, the estimated right ventricular systolic pressure is 63.0 mmHg. Left Atrium: Left atrial size was severely dilated. Right Atrium: Right atrial size was normal in size. Pericardium: There is no evidence of pericardial effusion. Mitral Valve: The mitral valve is normal in structure. No evidence of mitral valve regurgitation. No evidence of mitral valve stenosis. Tricuspid Valve: The tricuspid valve is normal in structure. Tricuspid valve regurgitation is not demonstrated. No evidence of tricuspid  stenosis. Aortic Valve: The aortic valve is normal in structure. There is mild calcification of the aortic valve. There is mild thickening of the aortic valve. Aortic valve regurgitation is not visualized. Mild aortic valve sclerosis is present, with no evidence of aortic valve stenosis. Pulmonic Valve: The pulmonic valve was normal in structure. Pulmonic valve regurgitation is trivial. No evidence of pulmonic stenosis. Aorta: The aortic root is normal in size and structure. Venous: The inferior vena cava is normal in size with greater than 50% respiratory variability, suggesting right atrial pressure of 3 mmHg. IAS/Shunts: No atrial level shunt detected by color flow Doppler.  LEFT VENTRICLE PLAX 2D LVIDd:         4.60 cm LVIDs:         2.90 cm LV PW:         1.20 cm LV IVS:        1.10 cm LVOT diam:     2.10 cm LV SV:         70 LV SV Index:   38 LVOT Area:     3.46 cm  RIGHT VENTRICLE          IVC RV Basal diam:  3.80 cm  IVC diam: 2.70 cm RV Mid diam:    2.60 cm TAPSE (M-mode): 1.9 cm LEFT ATRIUM              Index       RIGHT ATRIUM           Index LA diam:        5.40 cm  2.90 cm/m  RA Area:  19.70 cm LA Vol (A2C):   94.2 ml  50.66 ml/m RA Volume:   56.00 ml  30.12 ml/m LA Vol (A4C):   109.0 ml 58.62 ml/m LA Biplane Vol: 103.0 ml 55.39 ml/m  AORTIC VALVE LVOT Vmax:   108.05 cm/s LVOT Vmean:  69.300 cm/s LVOT VTI:    0.202 m  AORTA Ao Root diam: 2.70 cm Ao Asc diam:  3.00 cm MITRAL VALVE               TRICUSPID VALVE MV Area (PHT): 2.99 cm    TR Peak grad:   22.3 mmHg MV Decel Time: 254 msec    TR Vmax:        236.00 cm/s MV E velocity: 94.65 cm/s MV A velocity: 80.90 cm/s  SHUNTS MV E/A ratio:  1.17        Systemic VTI:  0.20 m                            Systemic Diam: 2.10 cm Candee Furbish MD Electronically signed by Candee Furbish MD Signature Date/Time: 11/15/2020/1:15:09 PM    Final         Scheduled Meds: . atorvastatin  10 mg Oral QPM  . budesonide (PULMICORT) nebulizer solution  0.25 mg  Nebulization BID  . carvedilol  25 mg Oral BID WC  . enoxaparin (LOVENOX) injection  40 mg Subcutaneous Daily  . ferrous sulfate  325 mg Oral BID  . furosemide  40 mg Intravenous BID  . guaiFENesin  1,200 mg Oral BID  . hydrALAZINE  100 mg Oral Q8H  . insulin aspart  0-15 Units Subcutaneous TID WC  . insulin aspart  0-5 Units Subcutaneous QHS  . insulin aspart  3 Units Subcutaneous TID WC  . insulin glargine  30 Units Subcutaneous QHS  . isosorbide mononitrate  30 mg Oral Daily  . lamoTRIgine  150 mg Oral BID  . levETIRAcetam  1,500 mg Oral QHS  . levofloxacin  750 mg Oral Daily  . lipase/protease/amylase  48,000 Units Oral BID WC   And  . lipase/protease/amylase  24,000 Units Oral Q1200  . methylPREDNISolone (SOLU-MEDROL) injection  40 mg Intravenous Q12H  . pantoprazole  40 mg Oral BID  . potassium chloride SA  40 mEq Oral Daily  . senna-docusate  1 tablet Oral Daily  . sertraline  50 mg Oral Daily   Continuous Infusions:   LOS: 1 day    Time spent: 42 minutes spent on chart review, discussion with nursing staff, consultants, updating family and interview/physical exam; more than 50% of that time was spent in counseling and/or coordination of care.    Jadarian Mckay J [place] DO [facility] Available via Epic secure chat 7am-7pm After these hours, please refer to coverage provider listed on [email or web address] 11/16/2020, 2:42 PM

## 2020-11-17 ENCOUNTER — Inpatient Hospital Stay (HOSPITAL_COMMUNITY): Payer: Medicare HMO

## 2020-11-17 LAB — GLUCOSE, CAPILLARY
Glucose-Capillary: 233 mg/dL — ABNORMAL HIGH (ref 70–99)
Glucose-Capillary: 133 mg/dL — ABNORMAL HIGH (ref 70–99)
Glucose-Capillary: 274 mg/dL — ABNORMAL HIGH (ref 70–99)
Glucose-Capillary: 315 mg/dL — ABNORMAL HIGH (ref 70–99)

## 2020-11-17 LAB — BASIC METABOLIC PANEL
Anion gap: 9 (ref 5–15)
BUN: 27 mg/dL — ABNORMAL HIGH (ref 8–23)
CO2: 28 mmol/L (ref 22–32)
Calcium: 9 mg/dL (ref 8.9–10.3)
Chloride: 88 mmol/L — ABNORMAL LOW (ref 98–111)
Creatinine, Ser: 1.12 mg/dL — ABNORMAL HIGH (ref 0.44–1.00)
GFR, Estimated: 52 mL/min — ABNORMAL LOW (ref >60)
Glucose, Bld: 277 mg/dL — ABNORMAL HIGH (ref 70–99)
Potassium: 4.4 mmol/L (ref 3.5–5.1)
Sodium: 125 mmol/L — ABNORMAL LOW (ref 135–145)

## 2020-11-17 MED ORDER — SENNOSIDES-DOCUSATE SODIUM 8.6-50 MG PO TABS
2.0000 | ORAL_TABLET | Freq: Two times a day (BID) | ORAL | Status: DC
  Administered 2020-11-17 – 2020-11-18 (×2): 2 via ORAL
  Filled 2020-11-17 (×2): qty 2

## 2020-11-17 MED ORDER — BISACODYL 10 MG RE SUPP: 10.0000 mg | Freq: Once | RECTAL | Status: DC

## 2020-11-17 MED ORDER — LEVOFLOXACIN 750 MG PO TABS: 750.0000 mg | ORAL_TABLET | ORAL | Status: DC

## 2020-11-17 MED ORDER — MAGNESIUM CITRATE PO SOLN
1.0000 | Freq: Once | ORAL | Status: AC
  Administered 2020-11-17: 1 via ORAL
  Filled 2020-11-17: qty 296

## 2020-11-17 NOTE — Progress Notes (Signed)
PROGRESS NOTE    [name]  OMV:[id number] DOB: February 11, 1946 DOA: 11/13/2020 PCP: [name], MD    Brief Narrative:  [name] is a 75 year old female with past medical history significant for chronic diastolic congestive heart failure, essential hypertension, pulmonary hypertension, type 2 diabetes mellitus, seizure disorder who presents to [facility] ED on 5/22 with progressive shortness of breath, continued cough.  Patient reports cough productive of sputum, yellow/white in color.  Patient reports other family members with similar type symptoms.  Patient has been given cough medicine and completed 5-day course of Z-Pak outpatient without much improvement.  In the ED, patient is afebrile.  Sodium 136, potassium 3.4, chloride 98, CO2 28, glucose 200, BUN 7, creatinine 0.75.  BNP 164.4.  High sensitive troponin 11>9.  WBC 9.2, hemoglobin 10.2, platelets 266.  Influenza a/B PCR negative.  SARS-CoV-2 PCR negative.  D-dimer elevated 1.21.  Chest x-ray with pulmonary hypoinflation, cardiomegaly and stable enlargement of the central pulmonary arteries.  Patient was given neb treatments in the ED and IV steroids.  TRH consulted for further management and assessment of dyspnea secondary to bronchitis causing hypoxia.   Assessment & Plan:   Principal Problem:   SOB (shortness of breath) Active Problems:   Obesity hypoventilation syndrome (HCC)   Chronic pancreatitis (HCC)   Morbid obesity with BMI of 45.0-49.9, adult (HCC)   Type II diabetes mellitus, uncontrolled (HCC)   Seizure disorder (HCC)   Chronic diastolic CHF (congestive heart failure), NYHA class 2 (HCC)   Pulmonary hypertension, unspecified (HCC)   Chronic kidney disease, stage 2 (mild)   Acute hypoxic respiratory failure, POA Subacute bronchitis Patient presenting with progressive shortness of breath associated with productive cough.  COVID-19/influenza A/B PCR negative.  Respiratory viral panel unrevealing.  Chest  x-ray with no focal consolidation.  Elevated D-dimer but nuclear medicine VQ scan with no scintigraphic evidence of pulmonary embolism.  Failed outpatient treatment with cough medicine and 5-day course of Z-Pak. --Levaquin 750 mg p.o. every 48 hours x7 days --Prednisone 40 mg p.o. daily  --Budesonide neb twice daily --Mucinex 1200 mg p.o. twice daily --Robitussin-DM every 6 hours as needed cough --Tessalon 100 mg p.o. 3 times daily as needed cough not relieved by Robitussin --Hycodan 5 mL every 6 hours as needed severe cough --Albuterol neb every 2 hours as needed wheezing/shortness of breath --Given persistent cough, will check CT chest without contrast for further evaluation  Acute on chronic diastolic congestive heart failure Patient presenting with progressive shortness of breath.  Elevated BNP.  TTE 5/24 with LVEF 60 to 65%, mild LVH, grade 1 diastolic dysfunction, LA severely dilated, IVC normal in size.  Likely component of her underlying shortness of breath/cough and respiratory failure as above. --Carvedilol 25 mg p.o. twice daily --Isosorbide mononitrate 30 mg p.o. daily --Strict I's and O's and daily weights --Stop IV furosemide today with bump in creatinine and decrease in sodium level --BMP daily  Type 2 diabetes mellitus with hyperglycemia Hemoglobin A1c 7.7, not optimally controlled.  Home regimen includes Toujeo 25u qHS, Novolog ISS, and Ozempic --Lantus 30 units subcutaneously nightly --NovoLog 3 units 3 times daily AC --Moderate dose SSI for further coverage --CBGs before every meal/at bedtime  Pancreatic insufficiency: Continue Creon  GERD: Continue PPI  Depression/anxiety: Sertraline 50 mg p.o. daily  Essential hypertension --Carvedilol 25 mg p.o. twice daily --Hydralazine 20 mg p.o. 3 times daily --IV Lasix as above  History of seizure disorder --Lamictal 150 mg p.o. BID --Keppra 1500 mg p.o.  nightly  Hyperlipidemia: Atorvastatin 10 mg p.o.  daily  OSA/obesity hypoventilation syndrome -- Nocturnal CPAP  Morbid obesity Body mass index is 38.53 kg/m.  Discussed with patient needs for aggressive lifestyle changes/weight loss as this complicates all facets of care.  Outpatient follow-up with PCP.     DVT prophylaxis: Lovenox   Code Status: Full Code Family Communication: No family present at bedside this morning  Disposition Plan:  Level of care: Med-Surg Status is: Inpatient  Remains inpatient appropriate because:Ongoing diagnostic testing needed not appropriate for outpatient work up, Unsafe d/c plan, IV treatments appropriate due to intensity of illness or inability to take PO and Inpatient level of care appropriate due to severity of illness   Dispo: The patient is from: Home              Anticipated d/c is to: Home              Patient currently is not medically stable to d/c.   Difficult to place patient No   Consultants:   none  Procedures:   TTE  Antimicrobials:   Levaquin 5/25>>   Subjective: Patient seen examined at bedside, resting comfortably.  Sitting in bedside chair.  Continues with intermittent productive cough.  Now titrated off of supple oxygen.  No other specific complaints or concerns at this time.  Denies headache, no dizziness, no chest pain, no abdominal pain, no fever/chills/night sweats, no nausea cefonicid diarrhea, no weakness, no fatigue, no paresthesias.  No acute events overnight per nursing staff.  Objective: Vitals:   11/16/20 2111 11/17/20 0258 11/17/20 0725 11/17/20 0742  BP: 117/63 (!) 143/67  138/70  Pulse: 89 69 66 66  Resp:   20 18  Temp: 98.1 F (36.7 C) 98.3 F (36.8 C)    TempSrc: Oral Oral    SpO2: 96% 96% 99% 98%  Weight:  89.5 kg    Height:        Intake/Output Summary (Last 24 hours) at 11/17/2020 1258 Last data filed at 11/17/2020 0300 Gross per 24 hour  Intake 360 ml  Output 1800 ml  Net -1440 ml   Filed Weights   11/15/20 0529 11/16/20 0437  11/17/20 0258  Weight: 89.9 kg 89.1 kg 89.5 kg    Examination:  General exam: Appears calm and comfortable, continued intermittent coughing fits Respiratory system: Clear to auscultation. Respiratory effort normal.  On room air Cardiovascular system: S1 & S2 heard, RRR. No JVD, murmurs, rubs, gallops or clicks. No pedal edema. Gastrointestinal system: Abdomen is nondistended, soft and nontender. No organomegaly or masses felt. Normal bowel sounds heard. Central nervous system: Alert and oriented. No focal neurological deficits. Extremities: Symmetric 5 x 5 power. Skin: No rashes, lesions or ulcers Psychiatry: Judgement and insight appear normal. Mood & affect appropriate.     Data Reviewed: I have personally reviewed following labs and imaging studies  CBC: Recent Labs  Lab 11/13/20 2307 11/14/20 0652 11/15/20 0332 11/16/20 0043  WBC 9.2 8.7 7.7 7.9  NEUTROABS 8.2*  --  5.2 6.3  HGB 10.2* 11.0* 10.2* 10.7*  HCT 32.3* 34.8* 33.0* 34.1*  MCV 72.3* 72.2* 73.7* 72.2*  PLT 266 331 287 009   Basic Metabolic Panel: Recent Labs  Lab 11/13/20 2307 11/14/20 0652 11/14/20 0821 11/15/20 0332 11/16/20 0043 11/17/20 0122  NA 136 134*  --  131* 130* 125*  K 3.4* 3.7  --  3.6 4.3 4.4  CL 98 95*  --  92* 89* 88*  CO2 28 29  --  30 30 28   GLUCOSE 200* 247*  --  127* 223* 277*  BUN 7* 8  --  17 20 27*  CREATININE 0.75 0.71  --  0.76 0.95 1.12*  CALCIUM 8.8* 9.3  --  8.9 9.2 9.0  MG  --   --  1.8 1.6* 2.0  --   PHOS  --   --  3.7 2.8 3.8  --    GFR: Estimated Creatinine Clearance: 45.2 mL/min (A) (by C-G formula based on SCr of 1.12 mg/dL (H)). Liver Function Tests: Recent Labs  Lab 11/13/20 2307 11/15/20 0332 11/16/20 0043  AST 20 21 26   ALT 16 17 20   ALKPHOS 98 80 92  BILITOT 0.7 0.6 0.6  PROT 7.8 7.3 7.6  ALBUMIN 3.2* 3.0* 3.2*   No results for input(s): LIPASE, AMYLASE in the last 168 hours. No results for input(s): AMMONIA in the last 168 hours. Coagulation  Profile: No results for input(s): INR, PROTIME in the last 168 hours. Cardiac Enzymes: No results for input(s): CKTOTAL, CKMB, CKMBINDEX, TROPONINI in the last 168 hours. BNP (last 3 results) No results for input(s): PROBNP in the last 8760 hours. HbA1C: No results for input(s): HGBA1C in the last 72 hours. CBG: Recent Labs  Lab 11/16/20 1154 11/16/20 1551 11/16/20 2111 11/17/20 0742 11/17/20 1147  GLUCAP 147* 279* 180* 233* 133*   Lipid Profile: No results for input(s): CHOL, HDL, LDLCALC, TRIG, CHOLHDL, LDLDIRECT in the last 72 hours. Thyroid Function Tests: No results for input(s): TSH, T4TOTAL, FREET4, T3FREE, THYROIDAB in the last 72 hours. Anemia Panel: Recent Labs    11/15/20 0332 11/15/20 0338  VITAMINB12 463  --   FOLATE  --  10.6  FERRITIN 48  --   TIBC 295  --   IRON 36  --   RETICCTPCT  --  0.9   Sepsis Labs: No results for input(s): PROCALCITON, LATICACIDVEN in the last 168 hours.  Recent Results (from the past 240 hour(s))  Novel Coronavirus, NAA ([facility])     Status: None   Collection Time: 11/08/20  2:01 PM   Specimen: Nasopharyngeal(NP) swabs in vial transport medium  Result Value Ref Range Status   SARS-CoV-2, NAA Not Detected Not Detected Final    Comment: This nucleic acid amplification test was developed and its performance characteristics determined by [employer]. Nucleic acid amplification tests include RT-PCR and TMA. This test has not been FDA cleared or approved. This test has been authorized by FDA under an Emergency Use Authorization (EUA). This test is only authorized for the duration of time the declaration that circumstances exist justifying the authorization of the emergency use of in vitro diagnostic tests for detection of SARS-CoV-2 virus and/or diagnosis of COVID-19 infection under section 564(b)(1) of the Act, 21 U.S.C. 478GNF-6(O) (1), unless the authorization is terminated or revoked sooner. When diagnostic testing  is negative, the possibility of a false negative result should be considered in the context of a patient's recent exposures and the presence of clinical signs and symptoms consistent with COVID-19. An individual without symptoms of COVID-19 and who is not shedding SARS-CoV-2 virus wo uld expect to have a negative (not detected) result in this assay.   SARS-COV-2, NAA 2 DAY TAT     Status: None   Collection Time: 11/08/20  2:01 PM  Result Value Ref Range Status   SARS-CoV-2, NAA 2 DAY TAT Performed  Final  Resp Panel by RT-PCR (Flu A&B, Covid) Nasopharyngeal Swab     Status: None  Collection Time: 11/13/20  9:56 PM   Specimen: Nasopharyngeal Swab; Nasopharyngeal(NP) swabs in vial transport medium  Result Value Ref Range Status   SARS Coronavirus 2 by RT PCR NEGATIVE NEGATIVE Final    Comment: (NOTE) SARS-CoV-2 target nucleic acids are NOT DETECTED.  The SARS-CoV-2 RNA is generally detectable in upper respiratory specimens during the acute phase of infection. The lowest concentration of SARS-CoV-2 viral copies this assay can detect is 138 copies/mL. A negative result does not preclude SARS-Cov-2 infection and should not be used as the sole basis for treatment or other patient management decisions. A negative result may occur with  improper specimen collection/handling, submission of specimen other than nasopharyngeal swab, presence of viral mutation(s) within the areas targeted by this assay, and inadequate number of viral copies(<138 copies/mL). A negative result must be combined with clinical observations, patient history, and epidemiological information. The expected result is Negative.  Fact Sheet for Patients:  EntrepreneurPulse.com.au  Fact Sheet for Healthcare Providers:  IncredibleEmployment.be  This test is no t yet approved or cleared by the [place] FDA and  has been authorized for detection and/or diagnosis of SARS-CoV-2  by FDA under an Emergency Use Authorization (EUA). This EUA will remain  in effect (meaning this test can be used) for the duration of the COVID-19 declaration under Section 564(b)(1) of the Act, 21 U.S.C.section 360bbb-3(b)(1), unless the authorization is terminated  or revoked sooner.       Influenza A by PCR NEGATIVE NEGATIVE Final   Influenza B by PCR NEGATIVE NEGATIVE Final    Comment: (NOTE) The Xpert Xpress SARS-CoV-2/FLU/RSV plus assay is intended as an aid in the diagnosis of influenza from Nasopharyngeal swab specimens and should not be used as a sole basis for treatment. Nasal washings and aspirates are unacceptable for Xpert Xpress SARS-CoV-2/FLU/RSV testing.  Fact Sheet for Patients: EntrepreneurPulse.com.au  Fact Sheet for Healthcare Providers: IncredibleEmployment.be  This test is not yet approved or cleared by the [place] FDA and has been authorized for detection and/or diagnosis of SARS-CoV-2 by FDA under an Emergency Use Authorization (EUA). This EUA will remain in effect (meaning this test can be used) for the duration of the COVID-19 declaration under Section 564(b)(1) of the Act, 21 U.S.C. section 360bbb-3(b)(1), unless the authorization is terminated or revoked.  Performed at [facility], [place] [address] [place], [place] [address]   Respiratory (~20 pathogens) panel by PCR     Status: None   Collection Time: 11/14/20  5:24 AM   Specimen: Nasopharyngeal Swab; Respiratory  Result Value Ref Range Status   Adenovirus NOT DETECTED NOT DETECTED Final   Coronavirus 229E NOT DETECTED NOT DETECTED Final    Comment: (NOTE) The Coronavirus on the Respiratory Panel, DOES NOT test for the novel  Coronavirus (2019 nCoV)    Coronavirus HKU1 NOT DETECTED NOT DETECTED Final   Coronavirus NL63 NOT DETECTED NOT DETECTED Final   Coronavirus OC43 NOT DETECTED NOT DETECTED Final   Metapneumovirus NOT DETECTED NOT DETECTED  Final   Rhinovirus / Enterovirus NOT DETECTED NOT DETECTED Final   Influenza A NOT DETECTED NOT DETECTED Final   Influenza B NOT DETECTED NOT DETECTED Final   Parainfluenza Virus 1 NOT DETECTED NOT DETECTED Final   Parainfluenza Virus 2 NOT DETECTED NOT DETECTED Final   Parainfluenza Virus 3 NOT DETECTED NOT DETECTED Final   Parainfluenza Virus 4 NOT DETECTED NOT DETECTED Final   Respiratory Syncytial Virus NOT DETECTED NOT DETECTED Final   Bordetella pertussis NOT DETECTED  NOT DETECTED Final   Bordetella Parapertussis NOT DETECTED NOT DETECTED Final   Chlamydophila pneumoniae NOT DETECTED NOT DETECTED Final   Mycoplasma pneumoniae NOT DETECTED NOT DETECTED Final    Comment: Performed at [facility], [place] [address], [place], [place] [address]         Radiology Studies: DG Abd 1 View  Result Date: 11/17/2020 CLINICAL DATA:  Abdominal distension.  Question constipation. EXAM: ABDOMEN - 1 VIEW COMPARISON:  CT 01/27/2019 FINDINGS: Clips in the right upper quadrant consistent with previous cholecystectomy. Previous lower lumbar fusion hardware. Moderate amount intestinal gas within the small and large bowel but no dilated loops to suggest obstruction. Moderate amount of fecal matter in the colon, but within the range of normal. IMPRESSION: Moderate amount of intestinal gas. Moderate amount of stool in the colon. Though notable, the findings are within the general range of normal. Electronically Signed   By: Nelson Chimes M.D.   On: 11/17/2020 10:03   DG CHEST PORT 1 VIEW  Result Date: 11/16/2020 CLINICAL DATA:  Dyspnea EXAM: PORTABLE CHEST 1 VIEW COMPARISON:  11/15/2020 FINDINGS: Lungs volumes are small, but are symmetric and are clear. No pneumothorax or pleural effusion. Mild cardiomegaly is stable. The central pulmonary arteries are enlarged, unchanged. Osseous structures are age-appropriate. No acute bone abnormality. IMPRESSION: Progressive pulmonary hypoinflation. Stable  cardiomegaly. Enlargement of the central pulmonary arteries Electronically Signed   By: Fidela Salisbury MD   On: 11/16/2020 03:33        Scheduled Meds: . atorvastatin  10 mg Oral QPM  . bisacodyl  10 mg Rectal Once  . budesonide (PULMICORT) nebulizer solution  0.25 mg Nebulization BID  . carvedilol  25 mg Oral BID WC  . enoxaparin (LOVENOX) injection  40 mg Subcutaneous Daily  . ferrous sulfate  325 mg Oral BID  . guaiFENesin  1,200 mg Oral BID  . hydrALAZINE  100 mg Oral Q8H  . insulin aspart  0-15 Units Subcutaneous TID WC  . insulin aspart  0-5 Units Subcutaneous QHS  . insulin aspart  3 Units Subcutaneous TID WC  . insulin glargine  30 Units Subcutaneous QHS  . isosorbide mononitrate  30 mg Oral Daily  . lamoTRIgine  150 mg Oral BID  . levETIRAcetam  1,500 mg Oral QHS  . [START ON 11/19/2020] levofloxacin  750 mg Oral Q48H  . lipase/protease/amylase  48,000 Units Oral BID WC   And  . lipase/protease/amylase  24,000 Units Oral Q1200  . pantoprazole  40 mg Oral BID  . potassium chloride SA  40 mEq Oral Daily  . predniSONE  40 mg Oral Q breakfast  . senna-docusate  2 tablet Oral BID  . sertraline  50 mg Oral Daily   Continuous Infusions:   LOS: 2 days    Time spent: 40 minutes spent on chart review, discussion with nursing staff, consultants, updating family and interview/physical exam; more than 50% of that time was spent in counseling and/or coordination of care.    Zowie Lundahl J [place] DO [facility] Available via Epic secure chat 7am-7pm After these hours, please refer to coverage provider listed on [email or web address] 11/17/2020, 12:58 PM

## 2020-11-17 NOTE — Progress Notes (Signed)
Mobility Specialist - Progress Note   11/17/20 1438  Mobility  Activity Ambulated in hall  Level of Assistance Contact guard assist, steadying assist  Assistive Device None  Distance Ambulated (ft) 200 ft  Mobility Ambulated with assistance in hallway  Mobility Response Tolerated well  Mobility performed by Mobility specialist  $Mobility charge 1 Mobility   No LOB during ambulation, but pt did intermittently use the hand rails in the hallway. Pt to recliner after walk, call bell at side.   Pricilla Handler Mobility Specialist Mobility Specialist Phone: [phone]

## 2020-11-17 NOTE — Progress Notes (Signed)
PHARMACY NOTE:  ANTIMICROBIAL RENAL DOSAGE ADJUSTMENT  Current antimicrobial regimen includes a mismatch between antimicrobial dosage and estimated renal function.  As per policy approved by the Pharmacy & Therapeutics and Medical Executive Committees, the antimicrobial dosage will be adjusted accordingly.  Current antimicrobial dosage:  Levofloxacin 750 mg daily  Indication: Acute hypoxic respiratory failure, POA Subacute bronchitis  Renal Function:  Estimated Creatinine Clearance: 45.2 mL/min (A) (by C-G formula based on SCr of 1.12 mg/dL (H)).    Antimicrobial dosage has been changed to:  Levofloxacin 750 mg q48h  Thank you for allowing pharmacy to be a part of this patient's care.  Heloise Purpura, [facility] 11/17/2020 11:51 AM

## 2020-11-18 ENCOUNTER — Telehealth: Payer: Self-pay

## 2020-11-18 LAB — BASIC METABOLIC PANEL
Anion gap: 5 (ref 5–15)
BUN: 21 mg/dL (ref 8–23)
CO2: 35 mmol/L — ABNORMAL HIGH (ref 22–32)
Calcium: 9.3 mg/dL (ref 8.9–10.3)
Chloride: 90 mmol/L — ABNORMAL LOW (ref 98–111)
Creatinine, Ser: 0.88 mg/dL (ref 0.44–1.00)
GFR, Estimated: >60 mL/min (ref >60)
Glucose, Bld: 156 mg/dL — ABNORMAL HIGH (ref 70–99)
Potassium: 4.2 mmol/L (ref 3.5–5.1)
Sodium: 130 mmol/L — ABNORMAL LOW (ref 135–145)

## 2020-11-18 LAB — GLUCOSE, CAPILLARY
Glucose-Capillary: 152 mg/dL — ABNORMAL HIGH (ref 70–99)
Glucose-Capillary: 202 mg/dL — ABNORMAL HIGH (ref 70–99)

## 2020-11-18 MED ORDER — SPIRIVA HANDIHALER 18 MCG IN CAPS: 18.0000 ug | ORAL_CAPSULE | Freq: Every day | RESPIRATORY_TRACT | 3 refills | Status: DC

## 2020-11-18 MED ORDER — GUAIFENESIN-DM 100-10 MG/5ML PO SYRP
10.0000 mL | ORAL_SOLUTION | Freq: Four times a day (QID) | ORAL | 0 refills | Status: DC | PRN
Start: 2020-11-18 — End: 2024-01-14

## 2020-11-18 MED ORDER — LEVOFLOXACIN 750 MG PO TABS: 750.0000 mg | ORAL_TABLET | Freq: Every day | ORAL | 0 refills | Status: AC

## 2020-11-18 MED ORDER — PREDNISONE 10 MG PO TABS: ORAL_TABLET | ORAL | 0 refills | Status: AC

## 2020-11-18 MED ORDER — LEVOFLOXACIN 750 MG PO TABS
750.0000 mg | ORAL_TABLET | Freq: Every day | ORAL | Status: DC
  Administered 2020-11-18: 750 mg via ORAL
  Filled 2020-11-18: qty 1

## 2020-11-18 MED ORDER — FLUTICASONE FUROATE-VILANTEROL 100-25 MCG/INH IN AEPB: 1.0000 | INHALATION_SPRAY | Freq: Every day | RESPIRATORY_TRACT | 0 refills | Status: AC

## 2020-11-18 NOTE — Discharge Summary (Signed)
Physician Discharge Summary  CHARLOTTE FIDALGO JJH:[id number] DOB: 02/06/1946 DOA: 11/13/2020  PCP: [name], MD  Admit date: 11/13/2020 Discharge date: 11/18/2020  Admitted From: Home Disposition: Home  Recommendations for Outpatient Follow-up:  1. Follow up with PCP in 1-2 weeks 2. Follow-up with pulmonology, Dr. Kimber Relic in 1 month 3. Continue prednisone taper on discharge 4. Continue antibiotics with Levaquin to complete course 5. 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  Patient reports cough productive of sputum, yellow/white in color.  Patient reports other family members with similar type symptoms.  Patient has been given cough medicine and completed 5-day course of Z-Pak outpatient without much improvement.  In the ED, patient is afebrile.  Sodium 136, potassium 3.4, chloride 98, CO2 28, glucose 200, BUN 7, creatinine 0.75.  BNP 164.4.  High sensitive troponin 11>9.  WBC 9.2, hemoglobin 10.2, platelets 266.  Influenza a/B PCR negative.  SARS-CoV-2 PCR negative.  D-dimer elevated 1.21.  Chest x-ray with pulmonary hypoinflation, cardiomegaly and stable enlargement of the central pulmonary arteries.  Patient was given neb treatments in the ED and IV steroids.  TRH consulted for further management and assessment of dyspnea secondary to  bronchitis causing hypoxia.  Hospital course:  Acute hypoxic respiratory failure, POA Subacute bronchitis History of postinfectious pulmonary fibrosis secondary to COVID-19 viral infection Patient presenting with progressive shortness of breath associated with productive cough.  COVID-19/influenza A/B PCR negative.  Respiratory viral panel unrevealing.  Chest x-ray with no focal consolidation.  Elevated D-dimer but nuclear medicine VQ scan with no scintigraphic evidence of pulmonary embolism.  Failed outpatient treatment with cough medicine and 5-day course of Z-Pak.  Patient was started on IV steroids, antibiotics with Levaquin and continue to receive budesonide and albuterol nebs during hospitalization.  Patient was able to be titrated off of supplemental oxygen.  CT chest without contrast notable for emphysematous changes and resolution of right upper lobe pulmonary nodule, otherwise unrevealing.  Review of EMR, notes history of postinfectious pulmonary fibrosis from prior COVID-19 viral infection.  PFTs May 2021 that were normal.  Given patient's persistent pulmonary symptoms with cough, will give trial Spiriva and Breo Ellipta on discharge.  Patient will continue prednisone taper and Levaquin to complete 7-day antibiotic course.  Follow-up with PCP 1 week, pulmonology, Dr. Vaughan Browner 1 month.  Acute on chronic diastolic congestive heart failure Patient presenting with progressive shortness of breath.  Elevated BNP.  TTE 5/24 with LVEF 60 to 65%, mild LVH, grade 1 diastolic dysfunction, LA severely dilated, IVC normal in size.  Likely component of her underlying shortness of breath/cough and respiratory failure as above.  Initially started on aggressive IV diuresis with furosemide 40 mg IV twice daily, which was discontinued with slight bump in creatinine.  Carvedilol 25 mg p.o. twice daily, Isosorbide mononitrate 30 mg p.o. daily, and resume home furosemide.  Type 2 diabetes mellitus with  hyperglycemia Hemoglobin A1c 7.7, not optimally controlled.  Home regimen includes Toujeo 25u qHS, Novolog  ISS, and Ozempic.  Outpatient follow-up with PCP for further adjustments.  Pancreatic insufficiency: Continue Creon  GERD: Continue PPI  Depression/anxiety: Sertraline 50 mg p.o. daily  Essential hypertension Carvedilol 25 mg p.o. twice daily, Hydralazine 1000 mg p.o. 3 times daily, furosemide.  History of seizure disorder Lamictal 150 mg p.o. BID, Keppra 1500 mg p.o. nightly  Hyperlipidemia:  Atherosclerosis CT chest with notable atherosclerosis. Atorvastatin 10 mg p.o. daily  OSA/obesity hypoventilation syndrome Continue nocturnal CPAP  Morbid obesity Body mass index is 38.53 kg/m.  Discussed with patient needs for aggressive lifestyle changes/weight loss as this complicates all facets of care.  Outpatient follow-up with PCP.   Discharge Diagnoses:  Principal Problem:   SOB (shortness of breath) Active Problems:   Obesity hypoventilation syndrome (HCC)   Chronic pancreatitis (HCC)   Morbid obesity with BMI of 45.0-49.9, adult (HCC)   Type II diabetes mellitus, uncontrolled (HCC)   Seizure disorder (HCC)   Chronic diastolic CHF (congestive heart failure), NYHA class 2 (HCC)   Pulmonary hypertension, unspecified (HCC)   Chronic kidney disease, stage 2 (mild)    Discharge Instructions  Discharge Instructions    Call MD for:  difficulty breathing, headache or visual disturbances   Complete by: As directed    Call MD for:  extreme fatigue   Complete by: As directed    Call MD for:  persistant dizziness or light-headedness   Complete by: As directed    Call MD for:  persistant nausea and vomiting   Complete by: As directed    Call MD for:  severe uncontrolled pain   Complete by: As directed    Call MD for:  temperature >100.4   Complete by: As directed    Diet - low sodium heart healthy   Complete by: As directed    Increase activity slowly    Complete by: As directed      Allergies as of 11/18/2020      Reactions   Other Anaphylaxis, Rash   Bleach   Penicillins Hives   Did it involve swelling of the face/tongue/throat, SOB, or low BP? No Did it involve sudden or severe rash/hives, skin peeling, or any reaction on the inside of your mouth or nose? Yes Did you need to seek medical attention at a hospital or doctor's office? Yes When did it last happen?Childhood allergy  If all above answers are "NO", may proceed with cephalosporin use.   Sulfa Antibiotics Hives   Aspirin Other (See Comments)    On aspirin 81 mg - Rectal bleeding in dec 2018   Codeine    Headache and makes the patient feel "off"      Medication List    STOP taking these medications   azithromycin 250 MG tablet Commonly known as: Zithromax Z-Pak     TAKE these medications   acetaminophen 500 MG tablet Commonly known as: TYLENOL Take 500-1,000 mg by mouth every 4 (four) hours as needed for moderate pain.   albuterol 108 (90 Base) MCG/ACT inhaler Commonly known as: VENTOLIN HFA Inhale 1-2 puffs into the lungs every 6 (six) hours as needed for wheezing or shortness of breath.   Alcohol Wipes 70 % Misc Apply 1 each topically 2 (two) times daily.   atorvastatin 10 MG tablet Commonly known as: LIPITOR TAKE 1 TABLET EVERY EVENING. What changed: when to take this   B-D SINGLE USE SWABS REGULAR Pads USE TWICE DAILY AS DIRECTED   B-D ULTRAFINE III SHORT PEN 31G X 8 MM Misc  Generic drug: Insulin Pen Needle USE DAILY WITH VICTOZA AND/OR NOVOLOG.   benzonatate 100 MG capsule Commonly known as: Tessalon Perles Take 1 capsule (100 mg total) by mouth 3 (three) times daily as needed for cough.   carvedilol 25 MG tablet Commonly known as: COREG TAKE 1 TABLET TWICE DAILY WITH A MEAL   dexlansoprazole 60 MG capsule Commonly known as: Dexilant Take 1 capsule (60 mg total) by mouth daily.   diclofenac sodium 1 % Gel Commonly known as:  VOLTAREN Apply 2 g topically 4 (four) times daily as needed (pain).   ferrous sulfate 324 MG Tbec Take 324 mg by mouth 2 (two) times daily.   fluticasone furoate-vilanterol 100-25 MCG/INH Aepb Commonly known as: BREO ELLIPTA Inhale 1 puff into the lungs daily.   folic acid-vitamin b complex-vitamin c-selenium-zinc 3 MG Tabs tablet Take 1 tablet by mouth daily.   furosemide 80 MG tablet Commonly known as: LASIX Take 1 tablet (80 mg total) by mouth daily. 36m daily Monday-Friday, 430mon Saturday and Sunday   guaiFENesin-dextromethorphan 100-10 MG/5ML syrup Commonly known as: ROBITUSSIN DM Take 10 mLs by mouth every 6 (six) hours as needed for cough.   hydrALAZINE 100 MG tablet Commonly known as: APRESOLINE Take 1 tablet (100 mg total) by mouth 3 (three) times daily.   hydrocortisone cream 1 % Apply 1 application topically daily as needed for itching.   insulin lispro 100 UNIT/ML KwikPen Commonly known as: HumaLOG KwikPen Inject 0.02-0.04 mLs (2-4 Units total) into the skin 3 (three) times daily as needed (high blood sugar over 150).   isosorbide mononitrate 30 MG 24 hr tablet Commonly known as: IMDUR Take 1 tablet (30 mg total) by mouth daily.   lamoTRIgine 150 MG tablet Commonly known as: LaMICtal Take 1 tablet (150 mg total) by mouth 2 (two) times daily.   levETIRAcetam 500 MG 24 hr tablet Commonly known as: KEPPRA XR Take 3 tablets every night   levofloxacin 750 MG tablet Commonly known as: LEVAQUIN Take 1 tablet (750 mg total) by mouth daily for 5 days.   ondansetron 4 MG tablet Commonly known as: ZOFRAN Take 4 mg by mouth every 8 (eight) hours as needed for nausea or vomiting.   Ozempic (1 MG/DOSE) 4 MG/3ML Sopn Generic drug: Semaglutide (1 MG/DOSE) INJECT 1 MG INTO THE SKIN ONCE A WEEK.   Pancrelipase (Lip-Prot-Amyl) 25000-79000 units Cpep Take 1-2 capsules by mouth See admin instructions. Take 1 capsules in the morning with breakfast, 1 capsule with  lunch, and 2 capsules with supper   potassium chloride SA 20 MEQ tablet Commonly known as: KLOR-CON TAKE 2 TABLETS EVERY DAY BUT WHEN TAKING METOLAZONE TAKE 3 TABLETS DAILY AS DIRECTED What changed: See the new instructions.   predniSONE 10 MG tablet Commonly known as: DELTASONE Take 4 tablets (40 mg total) by mouth daily for 3 days, THEN 2 tablets (20 mg total) daily for 3 days, THEN 1 tablet (10 mg total) daily for 3 days. Start taking on: Nov 19, 2020   sennosides-docusate sodium 8.6-50 MG tablet Commonly known as: SENOKOT-S Take 1 tablet by mouth daily.   sertraline 50 MG tablet Commonly known as: ZOLOFT TAKE 1 TABLET EVERY DAY What changed: when to take this   Spiriva HandiHaler 18 MCG inhalation capsule Generic drug: tiotropium Place 1 capsule (18 mcg total) into inhaler and inhale daily.   tolnaftate 1 % cream Commonly known as: TINACTIN Apply 1 application topically daily as needed (foot fungus).   Toujeo SoloStar 300 UNIT/ML  Solostar Pen Generic drug: insulin glargine (1 Unit Dial) Inject 25 Units into the skin at bedtime. What changed: Another medication with the same name was removed. Continue taking this medication, and follow the directions you see here.   True Metrix Blood Glucose Test test strip Generic drug: glucose blood Use as directed to check blood sugars 2 times per day dx: e11.22   True Metrix Level 1 Low Soln Use as directed  Dx:e11.22   True Metrix Meter w/Device Kit Use as directed to check blood sugars 2 times per day dx: e11.22   TRUEplus Lancets 33G Misc Use as directed to check blood sugars 2 times per day dx: e11.22   vitamin C 1000 MG tablet Take 1,000 mg by mouth daily.       Follow-up Information    [name], MD. Schedule an appointment as soon as possible for a visit in 1 week(s).   Specialty: Internal Medicine Contact information: [address] [place] [address] [phone]        Marshell Garfinkel, MD. Schedule an appointment as soon as possible for a visit in 1 month(s).   Specialty: Pulmonary Disease Contact information: [place] [address] [phone]              Allergies  Allergen Reactions  . Other Anaphylaxis and Rash    Bleach  . Penicillins Hives    Did it involve swelling of the face/tongue/throat, SOB, or low BP? No Did it involve sudden or severe rash/hives, skin peeling, or any reaction on the inside of your mouth or nose? Yes Did you need to seek medical attention at a hospital or doctor's office? Yes When did it last happen?Childhood allergy  If all above answers are "NO", may proceed with cephalosporin use.    . Sulfa Antibiotics Hives  . Aspirin Other (See Comments)     On aspirin 81 mg - Rectal bleeding in dec 2018  . Codeine     Headache and makes the patient feel "off"    Consultations:  None   Procedures/Studies: DG Abd 1 View  Result Date: 11/17/2020 CLINICAL DATA:  Abdominal distension.  Question constipation. EXAM: ABDOMEN - 1 VIEW COMPARISON:  CT 01/27/2019 FINDINGS: Clips in the right upper quadrant consistent with previous cholecystectomy. Previous lower lumbar fusion hardware. Moderate amount intestinal gas within the small and large bowel but no dilated loops to suggest obstruction. Moderate amount of fecal matter in the colon, but within the range of normal. IMPRESSION: Moderate amount of intestinal gas. Moderate amount of stool in the colon. Though notable, the findings are within the general range of normal. Electronically Signed   By: Nelson Chimes M.D.   On: 11/17/2020 10:03   CT CHEST WO CONTRAST  Result Date: 11/17/2020 CLINICAL DATA:  Abnormal chest x-ray. EXAM: CT CHEST WITHOUT CONTRAST TECHNIQUE: Multidetector CT imaging of the chest was performed following the standard protocol without IV contrast. COMPARISON:  Nov 16, 2020.  March 29, 2020. FINDINGS: Cardiovascular: Atherosclerosis  of thoracic aorta is noted without aneurysm formation. Normal cardiac size. No pericardial effusion. Mild coronary artery calcifications are noted. Enlarged pulmonary artery is noted suggesting pulmonary artery hypertension. Mediastinum/Nodes: No enlarged mediastinal or axillary lymph nodes. Thyroid gland, trachea, and esophagus demonstrate no significant findings. Lungs/Pleura: No pneumothorax or pleural effusion is noted. Mild emphysematous disease is noted. Subpleural nodular density noted on prior exam in right upper lobe appears to have resolved. Minimal bibasilar subsegmental atelectasis  or scarring is noted. Upper Abdomen: No acute abnormality. Musculoskeletal: No chest wall mass or suspicious bone lesions identified. IMPRESSION: Subpleural nodular density noted in right upper lobe anteriorly on prior exam appears to have resolved. Minimal bibasilar subsegmental atelectasis or scarring is noted. Mild coronary artery calcifications are noted. Enlarged pulmonary artery is noted suggesting pulmonary hypertension. Aortic Atherosclerosis (ICD10-I70.0) and Emphysema (ICD10-J43.9). Electronically Signed   By: Marijo Conception M.D.   On: 11/17/2020 15:08   NM Pulmonary Perfusion  Result Date: 11/14/2020 CLINICAL DATA:  PE suspected, low/intermediate prob, positive D-dimer Shortness of breath. EXAM: NUCLEAR MEDICINE PERFUSION LUNG SCAN TECHNIQUE: Perfusion images were obtained in multiple projections after intravenous injection of radiopharmaceutical. Ventilation scans intentionally deferred if perfusion scan and chest x-ray adequate for interpretation during COVID 19 epidemic. RADIOPHARMACEUTICALS:  4.4 mCi Tc-19mMAA IV COMPARISON:  Chest radiograph yesterday ventilation perfusion scan 07/31/2019 FINDINGS: Homogeneous distribution of radiotracer throughout both lungs. No segmental or peripheral perfusion defects. Exam is unchanged from previous. IMPRESSION: No scintigraphic evidence of pulmonary embolus.  Electronically Signed   By: MKeith RakeM.D.   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 Laser settings: Lumenis Smart532 laser, slit lamp Lens: Mainster PRP 165 Power: 260 mW Spot size: 200 microns Duration: 30 msec # spots: 627 Placement of laser: Using a Mainster PRP 165 contact lens at the slit lamp, laser was placed in three confluent rows around patches of pigmented lattice at 0430 and 0730 oclock anterior to equator with additional rows anteriorly + posterior to peripheral schisis in IT quadrant. Complications: None. Patient tolerated the procedure well and received written and verbal post-procedure care information/education.   DG CHEST PORT 1 VIEW  Result Date: 11/16/2020 CLINICAL DATA:  Dyspnea EXAM: PORTABLE CHEST 1 VIEW COMPARISON:  11/15/2020 FINDINGS: Lungs volumes are small, but are symmetric and are clear. No pneumothorax or pleural effusion. Mild cardiomegaly is stable. The central pulmonary arteries are enlarged, unchanged. Osseous structures are age-appropriate. No acute bone abnormality. IMPRESSION: Progressive pulmonary hypoinflation. Stable cardiomegaly. Enlargement of the central pulmonary arteries Electronically Signed   By: AFidela SalisburyMD   On: 11/16/2020 03:33   DG CHEST PORT 1 VIEW  Result Date: 11/15/2020 CLINICAL DATA:  Shortness of breath.  History of COPD and CHF. EXAM: PORTABLE CHEST 1 VIEW COMPARISON:  11/13/2020. FINDINGS: Cardiomegaly with mild pulmonary venous congestion again noted. Low lung  volumes. Interim clearing of pulmonary interstitial prominence suggesting clearing CHF. No pleural effusion or pneumothorax. IMPRESSION: Cardiomegaly with mild pulmonary venous congestion again noted. Low lung volumes. Interim clearing of pulmonary interstitial prominence suggesting clearing CHF. Electronically Signed   By: TMarcello Moores Register   On: 11/15/2020 06:25   DG Chest Portable 1 View  Result Date: 11/13/2020 CLINICAL DATA:  Cough EXAM: PORTABLE CHEST 1 VIEW COMPARISON:  03/09/2020 FINDINGS: Lung volumes are small and there is bibasilar atelectasis. No confluent pulmonary infiltrate. No pneumothorax or pleural effusion. Mild cardiomegaly is stable. Enlargement of  the central pulmonary arteries is again identified, similar to prior examination and better assessed on prior CT examination of 03/29/2020. No superimposed overt pulmonary edema. No acute bone abnormality. IMPRESSION: Pulmonary hypoinflation Stable cardiomegaly Stable enlargement of the central pulmonary arteries. Electronically Signed   By: Fidela Salisbury MD   On: 11/13/2020 22:42   ECHOCARDIOGRAM COMPLETE  Result Date: 11/15/2020    ECHOCARDIOGRAM REPORT   Patient Name:   QUANIYAH BUGH [facility] Date of Exam: 11/15/2020 Medical Rec #:  [id number]       Height:       60.0 in Accession #:    [id number]      Weight:       198.2 lb Date of Birth:  [birth year]      BSA:          1.859 m Patient Age:    31 years        BP:           122/79 mmHg Patient Gender: F               HR:           77 bpm. Exam Location:  Inpatient Procedure: 2D Echo, Cardiac Doppler and Color Doppler Indications:    Dyspnea R06.00  History:        Patient has prior history of Echocardiogram examinations, most                 recent 03/21/2020. CHF, Previous Myocardial Infarction, COPD;                 Risk Factors:Hypertension, Diabetes, Sleep Apnea and                 Dyslipidemia. COVID-19. Seizures. CKD.  Sonographer:    Jonelle Sidle Dance Referring Phys: [id number] Georgina Quint LATIF [place]  1. Left ventricular ejection fraction, by estimation, is 60 to 65%. The left ventricle has normal function. The left ventricle has no regional wall motion abnormalities. There is mild left ventricular hypertrophy. Left ventricular diastolic parameters are consistent with Grade I diastolic dysfunction (impaired relaxation).  2. Right ventricular systolic function is normal. The right ventricular size is normal. There is normal pulmonary artery systolic pressure.  3. Left atrial size was severely dilated.  4. The mitral valve is normal in structure. No evidence of mitral valve regurgitation. No evidence of mitral stenosis.  5. The aortic valve is normal in structure. There is mild calcification of the aortic valve. There is mild thickening of the aortic valve. Aortic valve regurgitation is not visualized. Mild aortic valve sclerosis is present, with no evidence of aortic valve stenosis.  6. The inferior vena cava is normal in size with greater than 50% respiratory variability, suggesting right atrial pressure of 3 mmHg. FINDINGS  Left Ventricle: Left ventricular ejection fraction, by estimation, is 60 to 65%. The left ventricle has normal function. The left ventricle has no regional wall motion abnormalities. The left ventricular internal cavity size was normal in size. There is  mild left ventricular hypertrophy. Left ventricular diastolic parameters are consistent with Grade I diastolic dysfunction (impaired relaxation). Right Ventricle: The right ventricular size is normal. No increase in right ventricular wall thickness. Right ventricular systolic function is normal. There is normal pulmonary artery systolic pressure. The tricuspid regurgitant velocity is 2.36 m/s, and  with an assumed right atrial pressure of 8 mmHg, the estimated right ventricular systolic pressure is 20.2 mmHg. Left Atrium: Left atrial size was severely dilated. Right  Atrium: Right atrial size was normal in size. Pericardium: There  is no evidence of pericardial effusion. Mitral Valve: The mitral valve is normal in structure. No evidence of mitral valve regurgitation. No evidence of mitral valve stenosis. Tricuspid Valve: The tricuspid valve is normal in structure. Tricuspid valve regurgitation is not demonstrated. No evidence of tricuspid stenosis. Aortic Valve: The aortic valve is normal in structure. There is mild calcification of the aortic valve. There is mild thickening of the aortic valve. Aortic valve regurgitation is not visualized. Mild aortic valve sclerosis is present, with no evidence of aortic valve stenosis. Pulmonic Valve: The pulmonic valve was normal in structure. Pulmonic valve regurgitation is trivial. No evidence of pulmonic stenosis. Aorta: The aortic root is normal in size and structure. Venous: The inferior vena cava is normal in size with greater than 50% respiratory variability, suggesting right atrial pressure of 3 mmHg. IAS/Shunts: No atrial level shunt detected by color flow Doppler.  LEFT VENTRICLE PLAX 2D LVIDd:         4.60 cm LVIDs:         2.90 cm LV PW:         1.20 cm LV IVS:        1.10 cm LVOT diam:     2.10 cm LV SV:         70 LV SV Index:   38 LVOT Area:     3.46 cm  RIGHT VENTRICLE          IVC RV Basal diam:  3.80 cm  IVC diam: 2.70 cm RV Mid diam:    2.60 cm TAPSE (M-mode): 1.9 cm LEFT ATRIUM              Index       RIGHT ATRIUM           Index LA diam:        5.40 cm  2.90 cm/m  RA Area:     19.70 cm LA Vol (A2C):   94.2 ml  50.66 ml/m RA Volume:   56.00 ml  30.12 ml/m LA Vol (A4C):   109.0 ml 58.62 ml/m LA Biplane Vol: 103.0 ml 55.39 ml/m  AORTIC VALVE LVOT Vmax:   108.05 cm/s LVOT Vmean:  69.300 cm/s LVOT VTI:    0.202 m  AORTA Ao Root diam: 2.70 cm Ao Asc diam:  3.00 cm MITRAL VALVE               TRICUSPID VALVE MV Area (PHT): 2.99 cm    TR Peak grad:   22.3 mmHg MV Decel Time: 254 msec    TR Vmax:        236.00 cm/s MV E velocity: 94.65 cm/s MV A velocity: 80.90 cm/s  SHUNTS MV E/A  ratio:  1.17        Systemic VTI:  0.20 m                            Systemic Diam: 2.10 cm Candee Furbish MD Electronically signed by Candee Furbish MD Signature Date/Time: 11/15/2020/1:15:09 PM    Final    OCT, Retina - OU - Both Eyes  Result Date: 10/28/2020 Right Eye Quality was borderline. Central Foveal Thickness: 320. Progression has worsened. Findings include abnormal foveal contour, no IRF, no SRF (Foveal notch; interval development of vitreous opacities; peripheral schisis and focal SRF with retinal break caught on widefield -- not imaged today). Left Eye Quality was  borderline. Central Foveal Thickness: 256. Progression has been stable. Findings include normal foveal contour, no IRF, no SRF (Rare drusen). Notes *Images captured and stored on drive Diagnosis / Impression: OD: Foveal notch; interval development of vitreous opacities; peripheral schisis and focal SRF with retinal break caught on widefield -- not imaged today OS: NFP, no IRF/SRF Clinical management: See below Abbreviations: NFP - Normal foveal profile. CME - cystoid macular edema. PED - pigment epithelial detachment. IRF - intraretinal fluid. SRF - subretinal fluid. EZ - ellipsoid zone. ERM - epiretinal membrane. ORA - outer retinal atrophy. ORT - outer retinal tubulation. SRHM - subretinal hyper-reflective material. IRHM - intraretinal hyper-reflective material   OCT, Retina - OU - Both Eyes  Result Date: 10/26/2020 Right Eye Quality was good. Central Foveal Thickness: 240. Progression has no prior data. Findings include abnormal foveal contour, no IRF, no SRF (Foveal notch, peripheral schisis and focal SRF with retinal break caught on widefield). Left Eye Quality was good. Central Foveal Thickness: 246. Progression has no prior data. Findings include normal foveal contour, no IRF, no SRF (Rare drusen). Notes *Images captured and stored on drive Diagnosis / Impression: OD: Foveal notch, peripheral schisis and focal SRF with retinal break  caught on widefield -- IT quadrant OS: NFP, no IRF/SRF Clinical management: See below Abbreviations: NFP - Normal foveal profile. CME - cystoid macular edema. PED - pigment epithelial detachment. IRF - intraretinal fluid. SRF - subretinal fluid. EZ - ellipsoid zone. ERM - epiretinal membrane. ORA - outer retinal atrophy. ORT - outer retinal tubulation. SRHM - subretinal hyper-reflective material. IRHM - intraretinal hyper-reflective material   Repair Retinal Breaks, Laser - OS - Left Eye  Result Date: 10/28/2020 Time Out Confirmed correct patient, procedure, site, and patient consented. Anesthesia Topical anesthesia was used. Anesthetic medications included Proparacaine 0.5%. Post-op The patient tolerated the procedure well. There were no complications. The patient received written and verbal post procedure care education. Notes LASER PROCEDURE NOTE Procedure:  Barrier laser retinopexy using slit lamp laser, LEFT eye Diagnosis:   Lattice degeneration w/ atrophic holes, LEFT eye                     Focal patches of lattice at 0430 and 0500 Surgeon: Bernarda Caffey, MD, PhD Anesthesia: Topical Informed consent obtained, operative eye marked, and time out performed prior to initiation of laser. Laser settings: Lumenis [id number] laser, slit lamp Lens: Mainster PRP 165 Power: 250 mW Spot size: 200 microns Duration: 30 msec # spots: 346 Placement of laser: Using a Mainster PRP 165 contact lens at the slit lamp, laser was placed in three confluent rows around patches of lattice w/ atrophic holes at 430 and 500 oclock anterior to equator with additional rows anteriorly. Complications: None. Patient tolerated the procedure well and received written and verbal post-procedure care information/education.      Subjective: Patient seen examined at bedside, resting comfortably in bedside chair.  Continues with intermittent nonproductive cough.  Improved.  Continues to be off of supplemental oxygen without any significant  desaturation with exertion.  No other questions or concerns at this time.  Ready for discharge home.  Discussed need for close outpatient follow-up with PCP and pulmonology.  Patient denies headache, no fever/chills/night sweats, no nausea/vomiting/diarrhea, no chest pain, palpitations, no abdominal pain, no weakness, no fatigue, no paresthesias.  No acute events overnight per nursing staff.  Discharge Exam: Vitals:   11/18/20 0723 11/18/20 0815  BP: 111/60   Pulse: 63   Resp: 20  Temp: 98.1 F (36.7 C)   SpO2: 93% 98%   Vitals:   11/17/20 2125 11/18/20 0633 11/18/20 0723 11/18/20 0815  BP: 121/61 114/69 111/60   Pulse: 63 64 63   Resp:  17 20   Temp: 98.5 F (36.9 C) 98.2 F (36.8 C) 98.1 F (36.7 C)   TempSrc: Oral Oral Oral   SpO2: 96% 97% 93% 98%  Weight:  89.7 kg    Height:        General: Pt is alert, awake, not in acute distress Cardiovascular: RRR, S1/S2 +, no rubs, no gallops Respiratory: CTA bilaterally, no wheezing, no rhonchi, on room air Abdominal: Soft, NT, ND, bowel sounds + Extremities: no edema, no cyanosis    The results of significant diagnostics from this hospitalization (including imaging, microbiology, ancillary and laboratory) are listed below for reference.     Microbiology: Recent Results (from the past 240 hour(s))  Novel Coronavirus, NAA ([facility])     Status: None   Collection Time: 11/08/20  2:01 PM   Specimen: Nasopharyngeal(NP) swabs in vial transport medium  Result Value Ref Range Status   SARS-CoV-2, NAA Not Detected Not Detected Final    Comment: This nucleic acid amplification test was developed and its performance characteristics determined by [employer], Dickinson and Company. Nucleic acid amplification tests include RT-PCR and TMA. This test has not been FDA cleared or approved. This test has been authorized by FDA under an Emergency Use Authorization (EUA). This test is only authorized for the duration of time the declaration that  circumstances exist justifying the authorization of the emergency use of in vitro diagnostic tests for detection of SARS-CoV-2 virus and/or diagnosis of COVID-19 infection under section 564(b)(1) of the Act, 21 U.S.C. 188CZY-6(A) (1), unless the authorization is terminated or revoked sooner. When diagnostic testing is negative, the possibility of a false negative result should be considered in the context of a patient's recent exposures and the presence of clinical signs and symptoms consistent with COVID-19. An individual without symptoms of COVID-19 and who is not shedding SARS-CoV-2 virus wo uld expect to have a negative (not detected) result in this assay.   SARS-COV-2, NAA 2 DAY TAT     Status: None   Collection Time: 11/08/20  2:01 PM  Result Value Ref Range Status   SARS-CoV-2, NAA 2 DAY TAT Performed  Final  Resp Panel by RT-PCR (Flu A&B, Covid) Nasopharyngeal Swab     Status: None   Collection Time: 11/13/20  9:56 PM   Specimen: Nasopharyngeal Swab; Nasopharyngeal(NP) swabs in vial transport medium  Result Value Ref Range Status   SARS Coronavirus 2 by RT PCR NEGATIVE NEGATIVE Final    Comment: (NOTE) SARS-CoV-2 target nucleic acids are NOT DETECTED.  The SARS-CoV-2 RNA is generally detectable in upper respiratory specimens during the acute phase of infection. The lowest concentration of SARS-CoV-2 viral copies this assay can detect is 138 copies/mL. A negative result does not preclude SARS-Cov-2 infection and should not be used as the sole basis for treatment or other patient management decisions. A negative result may occur with  improper specimen collection/handling, submission of specimen other than nasopharyngeal swab, presence of viral mutation(s) within the areas targeted by this assay, and inadequate number of viral copies(<138 copies/mL). A negative result must be combined with clinical observations, patient history, and epidemiological information. The expected  result is Negative.  Fact Sheet for Patients:  EntrepreneurPulse.com.au  Fact Sheet for Healthcare Providers:  IncredibleEmployment.be  This test is no t yet approved or  cleared by the [place] and  has been authorized for detection and/or diagnosis of SARS-CoV-2 by FDA under an Emergency Use Authorization (EUA). This EUA will remain  in effect (meaning this test can be used) for the duration of the COVID-19 declaration under Section 564(b)(1) of the Act, 21 U.S.C.section 360bbb-3(b)(1), unless the authorization is terminated  or revoked sooner.       Influenza A by PCR NEGATIVE NEGATIVE Final   Influenza B by PCR NEGATIVE NEGATIVE Final    Comment: (NOTE) The Xpert Xpress SARS-CoV-2/FLU/RSV plus assay is intended as an aid in the diagnosis of influenza from Nasopharyngeal swab specimens and should not be used as a sole basis for treatment. Nasal washings and aspirates are unacceptable for Xpert Xpress SARS-CoV-2/FLU/RSV testing.  Fact Sheet for Patients: EntrepreneurPulse.com.au  Fact Sheet for Healthcare Providers: IncredibleEmployment.be  This test is not yet approved or cleared by the [place] FDA and has been authorized for detection and/or diagnosis of SARS-CoV-2 by FDA under an Emergency Use Authorization (EUA). This EUA will remain in effect (meaning this test can be used) for the duration of the COVID-19 declaration under Section 564(b)(1) of the Act, 21 U.S.C. section 360bbb-3(b)(1), unless the authorization is terminated or revoked.  Performed at [facility], [place] [address] [place], [place] [address]   Respiratory (~20 pathogens) panel by PCR     Status: None   Collection Time: 11/14/20  5:24 AM   Specimen: Nasopharyngeal Swab; Respiratory  Result Value Ref Range Status   Adenovirus NOT DETECTED NOT DETECTED Final   Coronavirus 229E NOT DETECTED NOT DETECTED Final     Comment: (NOTE) The Coronavirus on the Respiratory Panel, DOES NOT test for the novel  Coronavirus (2019 nCoV)    Coronavirus HKU1 NOT DETECTED NOT DETECTED Final   Coronavirus NL63 NOT DETECTED NOT DETECTED Final   Coronavirus OC43 NOT DETECTED NOT DETECTED Final   Metapneumovirus NOT DETECTED NOT DETECTED Final   Rhinovirus / Enterovirus NOT DETECTED NOT DETECTED Final   Influenza A NOT DETECTED NOT DETECTED Final   Influenza B NOT DETECTED NOT DETECTED Final   Parainfluenza Virus 1 NOT DETECTED NOT DETECTED Final   Parainfluenza Virus 2 NOT DETECTED NOT DETECTED Final   Parainfluenza Virus 3 NOT DETECTED NOT DETECTED Final   Parainfluenza Virus 4 NOT DETECTED NOT DETECTED Final   Respiratory Syncytial Virus NOT DETECTED NOT DETECTED Final   Bordetella pertussis NOT DETECTED NOT DETECTED Final   Bordetella Parapertussis NOT DETECTED NOT DETECTED Final   Chlamydophila pneumoniae NOT DETECTED NOT DETECTED Final   Mycoplasma pneumoniae NOT DETECTED NOT DETECTED Final    Comment: Performed at [facility], [place]. [address] [place], [place] [address]     Labs: BNP (last 3 results) Recent Labs    12/03/19 1210 03/09/20 1135 11/13/20 2307  BNP 81.3 131.8* 794.8*   Basic Metabolic Panel: Recent Labs  Lab 11/14/20 0652 11/14/20 0821 11/15/20 0332 11/16/20 0043 11/17/20 0122 11/18/20 0233  NA 134*  --  131* 130* 125* 130*  K 3.7  --  3.6 4.3 4.4 4.2  CL 95*  --  92* 89* 88* 90*  CO2 29  --  30 30 28  35*  GLUCOSE 247*  --  127* 223* 277* 156*  BUN 8  --  17 20 27* 21  CREATININE 0.71  --  0.76 0.95 1.12* 0.88  CALCIUM 9.3  --  8.9 9.2 9.0 9.3  MG  --  1.8 1.6*  2.0  --   --   PHOS  --  3.7 2.8 3.8  --   --    Liver Function Tests: Recent Labs  Lab 11/13/20 2307 11/15/20 0332 11/16/20 0043  AST 20 21 26   ALT 16 17 20   ALKPHOS 98 80 92  BILITOT 0.7 0.6 0.6  PROT 7.8 7.3 7.6  ALBUMIN 3.2* 3.0* 3.2*   No results for input(s): LIPASE, AMYLASE in the last 168  hours. No results for input(s): AMMONIA in the last 168 hours. CBC: Recent Labs  Lab 11/13/20 2307 11/14/20 0652 11/15/20 0332 11/16/20 0043  WBC 9.2 8.7 7.7 7.9  NEUTROABS 8.2*  --  5.2 6.3  HGB 10.2* 11.0* 10.2* 10.7*  HCT 32.3* 34.8* 33.0* 34.1*  MCV 72.3* 72.2* 73.7* 72.2*  PLT 266 331 287 350   Cardiac Enzymes: No results for input(s): CKTOTAL, CKMB, CKMBINDEX, TROPONINI in the last 168 hours. BNP: Invalid input(s): POCBNP CBG: Recent Labs  Lab 11/17/20 0742 11/17/20 1147 11/17/20 1553 11/17/20 2121 11/18/20 0813  GLUCAP 233* 133* 274* 315* 152*   D-Dimer No results for input(s): DDIMER in the last 72 hours. Hgb A1c No results for input(s): HGBA1C in the last 72 hours. Lipid Profile No results for input(s): CHOL, HDL, LDLCALC, TRIG, CHOLHDL, LDLDIRECT in the last 72 hours. Thyroid function studies No results for input(s): TSH, T4TOTAL, T3FREE, THYROIDAB in the last 72 hours.  Invalid input(s): FREET3 Anemia work up No results for input(s): VITAMINB12, FOLATE, FERRITIN, TIBC, IRON, RETICCTPCT in the last 72 hours. Urinalysis    Component Value Date/Time   COLORURINE YELLOW 01/28/2020 2325   APPEARANCEUR CLEAR 01/28/2020 2325   LABSPEC 1.005 01/28/2020 2325   PHURINE 7.0 01/28/2020 2325   GLUCOSEU NEGATIVE 01/28/2020 2325   GLUCOSEU NEGATIVE 04/17/2016 0955   HGBUR NEGATIVE 01/28/2020 2325   BILIRUBINUR Negative 06/06/2020 1322   KETONESUR NEGATIVE 01/28/2020 2325   PROTEINUR Negative 06/06/2020 1322   PROTEINUR NEGATIVE 01/28/2020 2325   UROBILINOGEN 0.2 06/06/2020 1322   UROBILINOGEN 0.2 04/17/2016 0955   NITRITE Negative 06/06/2020 1322   NITRITE NEGATIVE 01/28/2020 2325   LEUKOCYTESUR Negative 06/06/2020 1322   LEUKOCYTESUR NEGATIVE 01/28/2020 2325   Sepsis Labs Invalid input(s): PROCALCITONIN,  WBC,  LACTICIDVEN Microbiology Recent Results (from the past 240 hour(s))  Novel Coronavirus, NAA ([facility])     Status: None   Collection Time:  11/08/20  2:01 PM   Specimen: Nasopharyngeal(NP) swabs in vial transport medium  Result Value Ref Range Status   SARS-CoV-2, NAA Not Detected Not Detected Final    Comment: This nucleic acid amplification test was developed and its performance characteristics determined by [employer], [employer] and Company. Nucleic acid amplification tests include RT-PCR and TMA. This test has not been FDA cleared or approved. This test has been authorized by FDA under an Emergency Use Authorization (EUA). This test is only authorized for the duration of time the declaration that circumstances exist justifying the authorization of the emergency use of in vitro diagnostic tests for detection of SARS-CoV-2 virus and/or diagnosis of COVID-19 infection under section 564(b)(1) of the Act, 21 U.S.C. 938HWE-9(H) (1), unless the authorization is terminated or revoked sooner. When diagnostic testing is negative, the possibility of a false negative result should be considered in the context of a patient's recent exposures and the presence of clinical signs and symptoms consistent with COVID-19. An individual without symptoms of COVID-19 and who is not shedding SARS-CoV-2 virus wo uld expect to have a negative (not detected) result in this assay.  SARS-COV-2, NAA 2 DAY TAT     Status: None   Collection Time: 11/08/20  2:01 PM  Result Value Ref Range Status   SARS-CoV-2, NAA 2 DAY TAT Performed  Final  Resp Panel by RT-PCR (Flu A&B, Covid) Nasopharyngeal Swab     Status: None   Collection Time: 11/13/20  9:56 PM   Specimen: Nasopharyngeal Swab; Nasopharyngeal(NP) swabs in vial transport medium  Result Value Ref Range Status   SARS Coronavirus 2 by RT PCR NEGATIVE NEGATIVE Final    Comment: (NOTE) SARS-CoV-2 target nucleic acids are NOT DETECTED.  The SARS-CoV-2 RNA is generally detectable in upper respiratory specimens during the acute phase of infection. The lowest concentration of SARS-CoV-2 viral copies this assay can  detect is 138 copies/mL. A negative result does not preclude SARS-Cov-2 infection and should not be used as the sole basis for treatment or other patient management decisions. A negative result may occur with  improper specimen collection/handling, submission of specimen other than nasopharyngeal swab, presence of viral mutation(s) within the areas targeted by this assay, and inadequate number of viral copies(<138 copies/mL). A negative result must be combined with clinical observations, patient history, and epidemiological information. The expected result is Negative.  Fact Sheet for Patients:  EntrepreneurPulse.com.au  Fact Sheet for Healthcare Providers:  IncredibleEmployment.be  This test is no t yet approved or cleared by the [place] FDA and  has been authorized for detection and/or diagnosis of SARS-CoV-2 by FDA under an Emergency Use Authorization (EUA). This EUA will remain  in effect (meaning this test can be used) for the duration of the COVID-19 declaration under Section 564(b)(1) of the Act, 21 U.S.C.section 360bbb-3(b)(1), unless the authorization is terminated  or revoked sooner.       Influenza A by PCR NEGATIVE NEGATIVE Final   Influenza B by PCR NEGATIVE NEGATIVE Final    Comment: (NOTE) The Xpert Xpress SARS-CoV-2/FLU/RSV plus assay is intended as an aid in the diagnosis of influenza from Nasopharyngeal swab specimens and should not be used as a sole basis for treatment. Nasal washings and aspirates are unacceptable for Xpert Xpress SARS-CoV-2/FLU/RSV testing.  Fact Sheet for Patients: EntrepreneurPulse.com.au  Fact Sheet for Healthcare Providers: IncredibleEmployment.be  This test is not yet approved or cleared by the [place] FDA and has been authorized for detection and/or diagnosis of SARS-CoV-2 by FDA under an Emergency Use Authorization (EUA). This EUA will remain in  effect (meaning this test can be used) for the duration of the COVID-19 declaration under Section 564(b)(1) of the Act, 21 U.S.C. section 360bbb-3(b)(1), unless the authorization is terminated or revoked.  Performed at [facility], [place] [address] [place], [place] [address]   Respiratory (~20 pathogens) panel by PCR     Status: None   Collection Time: 11/14/20  5:24 AM   Specimen: Nasopharyngeal Swab; Respiratory  Result Value Ref Range Status   Adenovirus NOT DETECTED NOT DETECTED Final   Coronavirus 229E NOT DETECTED NOT DETECTED Final    Comment: (NOTE) The Coronavirus on the Respiratory Panel, DOES NOT test for the novel  Coronavirus (2019 nCoV)    Coronavirus HKU1 NOT DETECTED NOT DETECTED Final   Coronavirus NL63 NOT DETECTED NOT DETECTED Final   Coronavirus OC43 NOT DETECTED NOT DETECTED Final   Metapneumovirus NOT DETECTED NOT DETECTED Final   Rhinovirus / Enterovirus NOT DETECTED NOT DETECTED Final   Influenza A NOT DETECTED NOT DETECTED Final   Influenza B NOT DETECTED NOT DETECTED Final   Parainfluenza  Virus 1 NOT DETECTED NOT DETECTED Final   Parainfluenza Virus 2 NOT DETECTED NOT DETECTED Final   Parainfluenza Virus 3 NOT DETECTED NOT DETECTED Final   Parainfluenza Virus 4 NOT DETECTED NOT DETECTED Final   Respiratory Syncytial Virus NOT DETECTED NOT DETECTED Final   Bordetella pertussis NOT DETECTED NOT DETECTED Final   Bordetella Parapertussis NOT DETECTED NOT DETECTED Final   Chlamydophila pneumoniae NOT DETECTED NOT DETECTED Final   Mycoplasma pneumoniae NOT DETECTED NOT DETECTED Final    Comment: Performed at [facility], [place] [address] [place], [place] [address]     Time coordinating discharge: Over 30 minutes  SIGNED:   Gem Conkle J [place] DO  [facility] 11/18/2020, 9:47 AM

## 2020-11-18 NOTE — Care Management Important Message (Signed)
Important Message  Patient Details  Name: [name] MRN: [id number] Date of Birth: 12-06-1945   Medicare Important Message Given:  Yes   Patient mailed prior to IM delivery will mail to patient home address.   [name] 11/18/2020, 1:21 PM

## 2020-11-18 NOTE — Discharge Instructions (Addendum)
[email or web address]">  Chronic Bronchitis, Adult  Chronic bronchitis is inflammation of the large airways (bronchial tubes) that carry air to the lungs. The inflammation causes more mucus (sputum) to build up. The inflammation and mucus make it hard to breathe. This condition is a type of chronic obstructive pulmonary disease (COPD). Chronic bronchitis is a long-term (chronic) condition. It is defined as a chronic cough with sputum production:  For at least 3 months of the year.  For 2 years in a row. People with chronic bronchitis are more likely to get colds and other infections in the nose, throat, or airways. What are the causes? This condition is most often caused by:  A history of smoking.  Exposure to secondhand smoke or a smoky area for a long period of time.  Lung problems, such as emphysema, asthma, bronchiectasis, or cystic fibrosis.  Long-term exposure to certain fumes or chemicals that irritate the lungs.  Infection. What are the signs or symptoms? Symptoms of chronic bronchitis may include:  A cough that brings up mucus (productive cough).  Shortness of breath.  A whistling sound when you breathe (wheezing).  Chest tightness.  Colds or respiratory infections that go away and return. How is this diagnosed? This condition may be diagnosed based on:  Your symptoms and medical history.  A physical exam.  Tests, such as: ? Testing a sputum sample. ? Blood tests. ? A chest X-ray. ? Tests of lung (pulmonary) function. How is this treated? There is no cure for chronic bronchitis. Treatment may help control your symptoms. This includes:  Drinking fluids. This may help thin your mucus so it is easier to cough up.  Mucus-clearing techniques.  Taking medicine prescribed by your health care provider, such as: ? Inhaled medicine to improve airflow in and out of your lungs. ? Antibiotics to treat or prevent bacterial lung  infections.  Using oxygen therapy, if your blood oxygen level is very low.  Pulmonary rehabilitation. This is a program that helps you learn how to manage your breathing problem. The program may include exercise, education, counseling, treatment, and support. Follow these instructions at home: Medicines  Take over-the-counter and prescription medicines only as told by your health care provider.  If you were prescribed an antibiotic medicine, take it as told by your health care provider. Do not stop taking the antibiotic even if you start to feel better. Lifestyle  Do not use any products that contain nicotine or tobacco, such as cigarettes, e-cigarettes, and chewing tobacco. If you need help quitting, ask your health care provider.  Stay away from other people's smoke (secondhand smoke) and any irritants that make you cough more, such as chemical fumes.  Eat a healthy diet and get regular exercise. Talk with your health care provider about what activities are safe for you.   Preventing infections  Stay up to date on all immunizations, including the pneumonia and flu vaccines.  Wash your hands often with soap and water for at least 20 seconds. If soap and water are not available, use hand sanitizer.  Avoid contact with people who have symptoms of a cold or the flu. General instructions  Drink enough fluids to keep your urine pale yellow.  Use oxygen therapy at home as directed. ? Follow instructions from your health care provider about how to use oxygen safely and take steps to prevent fire. ? Do not smoke while using oxygen or allow others to smoke in your home.  Keep all follow-up visits as told by your  health care provider. This is important.   Contact a health care provider if:  Your shortness of breath or coughing gets worse even when you take medicine.  Your mucus gets thicker or changes color.  You are not able to cough up your mucus.  You have a fever. Get help right  away if:  You have trouble breathing.  You have chest pain.  You feel dizzy or confused. These symptoms may represent a serious problem that is an emergency. Do not wait to see if the symptoms will go away. Get medical help right away. Call your local emergency services (911 in the [place] Do not drive yourself to the hospital. Summary  Chronic bronchitis is inflammation of the large airways (bronchial tubes) that carry air to the lungs. The inflammation causes mucus (sputum) to build up. The inflammation and mucus make it hard to breathe.  If you were prescribed an antibiotic medicine, take it as told by your health care provider. Do not stop taking the antibiotic even if you start to feel better.  Drink enough fluids to keep your urine pale yellow. Drinking fluids may help thin your mucus so it is easier to cough up.  Do not use any products that contain nicotine or tobacco, such as cigarettes, e-cigarettes, and chewing tobacco. If you need help quitting, ask your health care provider. This information is not intended to replace advice given to you by your health care provider. Make sure you discuss any questions you have with your health care provider. Document Revised: 07/28/2019 Document Reviewed: 07/28/2019 Elsevier Patient Education  2021 [employer].

## 2020-11-18 NOTE — Telephone Encounter (Signed)
  Care Management   Follow Up Note   11/18/2020 Name: [name] MRN: [id number] DOB: [birth year]   Referred by: [name], MD Reason for referral : Chronic Care Management   Collaboration with [facility] Liaison who reports patient has been discharged home. SW performed chart review to note patient has scheduled call with PharmD on June 2nd.   Follow Up Plan: SW will contact the patient as scheduled on June 8 to assess for care coordination needs.  Daneen Schick, BSW, CDP Social Worker, Certified Dementia Practitioner [place] [facility] [place] [facility] [phone]

## 2020-11-18 NOTE — Progress Notes (Signed)
PHARMACY NOTE:  ANTIMICROBIAL RENAL DOSAGE ADJUSTMENT  Current antimicrobial regimen includes a mismatch between antimicrobial dosage and estimated renal function.  As per policy approved by the Pharmacy & Therapeutics and Medical Executive Committees, the antimicrobial dosage will be adjusted accordingly.  Current antimicrobial dosage:  Levofloxacin 750 mg q48h  Indication: Acute hypoxic respiratory failure, POA Subacute bronchitis  Renal Function:  Estimated Creatinine Clearance: 57.7 mL/min (by C-G formula based on SCr of 0.88 mg/dL).    Antimicrobial dosage has been changed to:  Levofloxacin 750 mg daily  Thank you for allowing pharmacy to be a part of this patient's care.  Einar Grad, [facility] 11/18/2020 8:30 AM

## 2020-11-18 NOTE — Progress Notes (Signed)
Pt is alert and oriented. Discharge instructions/ AVS given to pt. 

## 2020-11-22 ENCOUNTER — Telehealth: Payer: Self-pay

## 2020-11-22 ENCOUNTER — Other Ambulatory Visit (HOSPITAL_COMMUNITY): Payer: Self-pay | Admitting: Cardiology

## 2020-11-22 NOTE — Telephone Encounter (Signed)
Transition Care Management Unsuccessful Follow-up Telephone Call  Date of discharge and from where:11/18/2020  [facility].  Attempts:  1st Attempt  Reason for unsuccessful TCM follow-up call:  Left voice message

## 2020-11-23 ENCOUNTER — Telehealth: Payer: Self-pay

## 2020-11-23 ENCOUNTER — Ambulatory Visit (INDEPENDENT_AMBULATORY_CARE_PROVIDER_SITE_OTHER): Payer: Medicare HMO | Admitting: Nurse Practitioner

## 2020-11-23 ENCOUNTER — Other Ambulatory Visit: Payer: Self-pay

## 2020-11-23 ENCOUNTER — Encounter: Payer: Self-pay | Admitting: Nurse Practitioner

## 2020-11-23 VITALS — BP 124/60 | HR 84 | Temp 98.6°F | Ht 59.6 in | Wt 196.2 lb

## 2020-11-23 DIAGNOSIS — N182 Chronic kidney disease, stage 2 (mild): Secondary | ICD-10-CM

## 2020-11-23 DIAGNOSIS — E1122 Type 2 diabetes mellitus with diabetic chronic kidney disease: Secondary | ICD-10-CM | POA: Diagnosis not present

## 2020-11-23 DIAGNOSIS — Z794 Long term (current) use of insulin: Secondary | ICD-10-CM

## 2020-11-23 DIAGNOSIS — R059 Cough, unspecified: Secondary | ICD-10-CM | POA: Diagnosis not present

## 2020-11-23 DIAGNOSIS — I5033 Acute on chronic diastolic (congestive) heart failure: Secondary | ICD-10-CM | POA: Diagnosis not present

## 2020-11-23 MED ORDER — INSULIN LISPRO (1 UNIT DIAL) 100 UNIT/ML (KWIKPEN)
PEN_INJECTOR | SUBCUTANEOUS | 2 refills | Status: DC
Start: 2020-11-23 — End: 2022-12-31

## 2020-11-23 NOTE — Telephone Encounter (Signed)
Transition Care Management Follow-up Telephone Call  Date of discharge and from where: 11/18/2020 [facility]  How have you been since you were released from the hospital? Coughing her head off, bringing up a lot of mucus, short of breath.  Any questions or concerns? No  Items Reviewed:  Did the pt receive and understand the discharge instructions provided? Yes   Medications obtained and verified? Yes   Other? No   Any new allergies since your discharge? No   Dietary orders reviewed? yes  Do you have support at home? Yes   Home Care and Equipment/Supplies: Were home health services ordered? no If so, what is the name of the agency?  Has the agency set up a time to come to the patient's home? no Were any new equipment or medical supplies ordered?  No What is the name of the medical supply agency?  Were you able to get the supplies/equipment? Do you have any questions related to the use of the equipment or supplies? No  Functional Questionnaire: (I = Independent and D = Dependent) ADLs: I  Bathing/Dressing- I  Meal Prep- I  Eating- I  Maintaining continence- I  Transferring/Ambulation- I  Managing Meds- I  Follow up appointments reviewed:   PCP Hospital f/u appt confirmed? Yes  Scheduled to see Cletis Athens DNP on 11/23/2020 @10 :35  [facility] f/u appt confirmed? No  .  Are transportation arrangements needed? No   If their condition worsens, is the pt aware to call PCP or go to the Emergency Dept.?yes  Was the patient provided with contact information for the PCP's office or ED? Yes  Was to pt encouraged to call back with questions or concerns? yes

## 2020-11-23 NOTE — Progress Notes (Signed)
I,[name] as a Education administrator for [employer], NP.,have documented all relevant documentation on the behalf of [employer], NP,as directed by  [name] Castilla, NP while in the presence of [name] Castilla, NP. This visit occurred during the SARS-CoV-2 public health emergency.  Safety protocols were in place, including screening questions prior to the visit, additional usage of staff PPE, and extensive cleaning of exam room while observing appropriate contact time as indicated for disinfecting solutions.  Subjective:     Patient ID: [name] , female    DOB: 1945/10/09 , 75 y.o.   MRN: [id number]   Chief Complaint  Patient presents with  . hospital f/u    HPI  The patient presents today for a hospital f/u. She sees a pulmonologist in 2 weeks. In the hospital she was treated for   She is taking prednisone from the hospital. Her blood sugar has been elevated at 400-500 at home since she left the hospital. She does not take the sliding scale insulin. I am going to go ahead and restart it. She is will pick up the cough syrup and spriva to see if it help with her breathing. She is also seeing the cardiologist this month to follow up with them. No other concerns today.      Past Medical History:  Diagnosis Date  . Abnormal liver function     in the past.  . Anemia   . Arthritis    all over  . Bruises easily   . Cataract    right eye;immature  . CHF (congestive heart failure) ([place])   . Chronic back pain    stenosis  . Chronic cough   . Chronic kidney disease   . COPD (chronic obstructive pulmonary disease) ([place])   . COVID    aug/sept 2021  . Demand myocardial infarction [facility]) 2012   Demand Infarction in setting of Pancreatitis --> mild Troponin elevation, NON-OBSTRUCTIVE CAD  . Depression    takes Abilify daily as well as Zoloft  . Diastolic heart failure 4098   Grade 1 diastolic Dysfunction by Echo   . Diverticulosis   . DM (diabetes mellitus)  ([place])    takes Victoza daily as well as Lantus and Humalog  . Empty sella ([place])    on MRI in 2009.  . Glaucoma   . Headache(784.0)    last migraine-4-31yr ago  . History of blood transfusion    no abnormal reaction noted  . History of colon polyps    benign  . HTN (hypertension)    takes BKinder Morgan Energydaily  . Hyperlipidemia    takes Lipitor daily  . Joint swelling   . Nocturia   . Obesity hypoventilation syndrome ([place]   . Obstructive sleep apnea 02/2018   Notably improved split-night study with weight loss from 270 (2017) down to 250 pounds (2019).  . Pancreatitis    takes Pancrelipase daily  . Parkinson's disease ([place]    takes Sinemet daily  . Peripheral neuropathy   . Pneumonia 2012  . [place]izures ([place]    takes Lamictal daily and Primidone nightly;last seizure 2wks ago  . Urinary urgency    With increased frequency  . Varicose veins of both lower extremities with pain    With edema.  Takes daily Lasix     Family History  Problem Relation Age of Onset  . Allergies Father   . Heart disease Father        before 683 . Heart failure Father   .  Hypertension Father   . Hyperlipidemia Father   . Heart disease Mother   . Diabetes Mother   . Hypertension Mother   . Hyperlipidemia Mother   . Hypertension Sister   . Heart disease Sister        before 79  . Hyperlipidemia Sister   . Diabetes Brother   . Hypertension Brother   . Diabetes Sister   . Hypertension Sister   . Diabetes Son   . Hypertension Son   . Cancer Other      Current Outpatient Medications:  .  acetaminophen (TYLENOL) 500 MG tablet, Take 500-1,000 mg by mouth every 4 (four) hours as needed for moderate pain. , Disp: , Rfl:  .  albuterol (VENTOLIN HFA) 108 (90 Base) MCG/ACT inhaler, Inhale 1-2 puffs into the lungs every 6 (six) hours as needed for wheezing or shortness of breath., Disp: , Rfl:  .  Alcohol Swabs (B-D SINGLE USE SWABS REGULAR) PADS, USE TWICE DAILY AS DIRECTED, Disp: 200  each, Rfl: 6 .  Ascorbic Acid (VITAMIN C) 1000 MG tablet, Take 1,000 mg by mouth daily., Disp: , Rfl:  .  atorvastatin (LIPITOR) 10 MG tablet, TAKE 1 TABLET EVERY EVENING. (Patient taking differently: Take 10 mg by mouth daily.), Disp: 90 tablet, Rfl: 2 .  B-D ULTRAFINE III SHORT PEN 31G X 8 MM MISC, USE DAILY WITH VICTOZA AND/OR NOVOLOG., Disp: 400 each, Rfl: 1 .  benzonatate (TESSALON PERLES) 100 MG capsule, Take 1 capsule (100 mg total) by mouth 3 (three) times daily as needed for cough., Disp: 30 capsule, Rfl: 1 .  Blood Glucose Calibration (TRUE METRIX LEVEL 1) Low SOLN, Use as directed  Dx:e11.22, Disp: 1 each, Rfl: 2 .  Blood Glucose Monitoring Suppl (TRUE METRIX METER) w/Device KIT, Use as directed to check blood sugars 2 times per day dx: e11.22, Disp: 1 kit, Rfl: 1 .  carvedilol (COREG) 25 MG tablet, TAKE 1 TABLET TWICE DAILY WITH A MEAL, Disp: 180 tablet, Rfl: 2 .  dexlansoprazole (DEXILANT) 60 MG capsule, Take 1 capsule (60 mg total) by mouth daily., Disp: 90 capsule, Rfl: 2 .  diclofenac sodium (VOLTAREN) 1 % GEL, Apply 2 g topically 4 (four) times daily as needed (pain)., Disp: , Rfl:  .  ferrous sulfate 324 MG TBEC, Take 324 mg by mouth 2 (two) times daily., Disp: , Rfl:  .  fluticasone furoate-vilanterol (BREO ELLIPTA) 100-25 MCG/INH AEPB, Inhale 1 puff into the lungs daily., Disp: 90 each, Rfl: 0 .  folic acid-vitamin b complex-vitamin c-selenium-zinc (DIALYVITE) 3 MG TABS tablet, Take 1 tablet by mouth daily., Disp: , Rfl:  .  furosemide (LASIX) 80 MG tablet, Take 1 tablet (80 mg total) by mouth daily. 72m daily Monday-Friday, 422mon Saturday and Sunday, Disp: 90 tablet, Rfl: 0 .  glucose blood (TRUE METRIX BLOOD GLUCOSE TEST) test strip, Use as directed to check blood sugars 2 times per day dx: e11.22, Disp: 300 each, Rfl: 2 .  guaiFENesin-dextromethorphan (ROBITUSSIN DM) 100-10 MG/5ML syrup, Take 10 mLs by mouth every 6 (six) hours as needed for cough., Disp: 118 mL, Rfl: 0 .   hydrALAZINE (APRESOLINE) 100 MG tablet, Take 1 tablet (100 mg total) by mouth 3 (three) times daily., Disp: 270 tablet, Rfl: 3 .  hydrocortisone cream 1 %, Apply 1 application topically daily as needed for itching. , Disp: , Rfl:  .  Isopropyl Alcohol (ALCOHOL WIPES) 70 % MISC, Apply 1 each topically 2 (two) times daily., Disp: 300 each, Rfl: 3 .  isosorbide mononitrate (IMDUR) 30 MG 24 hr tablet, Take 1 tablet (30 mg total) by mouth daily., Disp: 30 tablet, Rfl: 1 .  lamoTRIgine (LAMICTAL) 150 MG tablet, Take 1 tablet (150 mg total) by mouth 2 (two) times daily., Disp: 180 tablet, Rfl: 3 .  levETIRAcetam (KEPPRA XR) 500 MG 24 hr tablet, Take 3 tablets every night, Disp: 270 tablet, Rfl: 3 .  levofloxacin (LEVAQUIN) 750 MG tablet, Take 1 tablet (750 mg total) by mouth daily for 5 days., Disp: 5 tablet, Rfl: 0 .  ondansetron (ZOFRAN) 4 MG tablet, Take 4 mg by mouth every 8 (eight) hours as needed for nausea or vomiting., Disp: , Rfl:  .  OZEMPIC, 1 MG/DOSE, 4 MG/3ML SOPN, INJECT 1 MG INTO THE SKIN ONCE A WEEK., Disp: 9 mL, Rfl: 3 .  Pancrelipase, Lip-Prot-Amyl, 25000-79000 units CPEP, Take 1-2 capsules by mouth See admin instructions. Take 1 capsules in the morning with breakfast, 1 capsule with lunch, and 2 capsules with supper, Disp: , Rfl:  .  potassium chloride SA (KLOR-CON) 20 MEQ tablet, TAKE 2 TABLETS EVERY DAY BUT WHEN TAKING METOLAZONE TAKE 3 TABLETS DAILY AS DIRECTED (Patient taking differently: Take 40 mEq by mouth daily.), Disp: 204 tablet, Rfl: 2 .  predniSONE (DELTASONE) 10 MG tablet, Take 4 tablets (40 mg total) by mouth daily for 3 days, THEN 2 tablets (20 mg total) daily for 3 days, THEN 1 tablet (10 mg total) daily for 3 days., Disp: 21 tablet, Rfl: 0 .  sennosides-docusate sodium (SENOKOT-S) 8.6-50 MG tablet, Take 1 tablet by mouth daily., Disp: , Rfl:  .  sertraline (ZOLOFT) 50 MG tablet, TAKE 1 TABLET EVERY DAY (Patient taking differently: Take 50 mg by mouth every evening.), Disp:  90 tablet, Rfl: 2 .  tiotropium (SPIRIVA HANDIHALER) 18 MCG inhalation capsule, Place 1 capsule (18 mcg total) into inhaler and inhale daily., Disp: 90 capsule, Rfl: 3 .  tolnaftate (TINACTIN) 1 % cream, Apply 1 application topically daily as needed (foot fungus). , Disp: , Rfl:  .  TOUJEO SOLOSTAR 300 UNIT/ML Solostar Pen, Inject 25 Units into the skin at bedtime., Disp: , Rfl:  .  TRUEplus Lancets 33G MISC, Use as directed to check blood sugars 2 times per day dx: e11.22, Disp: 300 each, Rfl: 2 .  insulin lispro (HUMALOG KWIKPEN) 100 UNIT/ML KwikPen, Inject 0.02-0.04 mLs (2-4 Units total) into the skin 3 (three) times daily as needed (high blood sugar over 150)., Disp: 15 mL, Rfl: 2   Allergies  Allergen Reactions  . Other Anaphylaxis and Rash    Bleach  . Penicillins Hives    Did it involve swelling of the face/tongue/throat, SOB, or low BP? No Did it involve sudden or severe rash/hives, skin peeling, or any reaction on the inside of your mouth or nose? Yes Did you need to seek medical attention at a hospital or doctor's office? Yes When did it last happen?Childhood allergy  If all above answers are "NO", may proceed with cephalosporin use.    . Sulfa Antibiotics Hives  . Aspirin Other (See Comments)     On aspirin 81 mg - Rectal bleeding in dec 2018  . Codeine     Headache and makes the patient feel "off"     Review of Systems  Constitutional: Negative for chills and fever.  HENT: Negative for sinus pain.   Respiratory: Positive for cough and shortness of breath. Negative for chest tightness and wheezing.   Cardiovascular: Negative for chest pain and palpitations.  Gastrointestinal: Negative for constipation and diarrhea.  Genitourinary: Negative for flank pain.     Today's Vitals   11/23/20 1056  BP: 124/60  Pulse: 84  Temp: 98.6 F (37 C)  Weight: 196 lb 3.2 oz (89 kg)  Height: 4' 11.6" (1.514 m)  PainSc: 0-No pain   Body mass index is 38.83 kg/m.    Objective:  Physical Exam Constitutional:      Appearance: Normal appearance. She is obese.  HENT:     Head: Normocephalic and atraumatic.  Cardiovascular:     Rate and Rhythm: Normal rate and regular rhythm.     Pulses: Normal pulses.     Heart sounds: Normal heart sounds.  Pulmonary:     Effort: Pulmonary effort is normal. No respiratory distress.     Breath sounds: Normal breath sounds. No wheezing.  Skin:    General: Skin is warm and dry.  Neurological:     Mental Status: She is alert.  Psychiatric:        Mood and Affect: Mood normal.        Behavior: Behavior normal.         Assessment And Plan:     1. Type 2 diabetes mellitus with stage 2 chronic kidney disease, with long-term current use of insulin ([place]) -Patient was recently discharged from the hospital since then has reported elevated BS readings at home. She was treated in the hospital with sliding scale insulin which she does not take anymore. She also has not taken her ozempic in a while. -Will go ahead and start patient back on the Humalog sliding scale insulin and instructed patient to restart her ozempic.  -Patient will keep log of her BS and let us know if it continues to be elevated. She still has prednisone course to finish as that may be elevating her BS levels as well.  - insulin lispro (HUMALOG KWIKPEN) 100 UNIT/ML KwikPen; Inject 0.02-0.04 mLs (2-4 Units total) into the skin 3 (three) times daily as needed (high blood sugar over 150).  Dispense: 15 mL; Refill: 2  2. Acute on chronic diastolic (congestive) heart failure (HCC) -Continue meds, stable -follow up with cardiologist.   3. Cough  -Patient prescribed guaifenesin dextromethorphan will pick it up. -Patient follows up with pulmonologist in 2 weeks. -Encouraged patient to use her Spiriva and breo inhaler. -Patient to finish antibiotic course.   Side effects and appropriate use of all the medication(s) were discussed with the patient today.  Patient advised to use the medication(s) as directed by their healthcare provider. The patient was encouraged to read, review, and understand all associated package inserts and contact our office with any questions or concerns. The patient accepts the risks of the treatment plan and had an opportunity to ask questions.   Follow up: if symptoms persist or do not get better.   Patient was given opportunity to ask questions. Patient verbalized understanding of the plan and was able to repeat key elements of the plan. All questions were answered to their satisfaction.  Raman Marshayla Mitschke, DNP   I, Raman Nachmen Mansel have reviewed all documentation for this visit. The documentation on 11/23/20 for the exam, diagnosis, procedures, and orders are all accurate and complete.    IF YOU HAVE BEEN REFERRED TO A SPECIALIST, IT MAY TAKE 1-2 WEEKS TO SCHEDULE/PROCESS THE REFERRAL. IF YOU HAVE NOT HEARD FROM US/SPECIALIST IN TWO WEEKS, PLEASE GIVE [place] A CALL AT [phone].   THE PATIENT IS ENCOURAGED TO PRACTICE SOCIAL DISTANCING  DUE TO THE COVID-19 PANDEMIC.

## 2020-11-23 NOTE — Chronic Care Management (AMB) (Signed)
  No answer, left message of telephone appointment with [name] [facility] on 11-24-2020 at 9:00. Left message to have all medications, supplements, blood pressure and/or blood sugar logs available during appointment and to return call if need to reschedule.  [place] Pharmacist Assistant [phone]

## 2020-11-23 NOTE — Progress Notes (Signed)
[facility] Note  11/25/2020     CHIEF COMPLAINT Patient presents for Retina Follow Up   HISTORY OF PRESENT ILLNESS: [name] is a 75 y.o. female who presents to the clinic today for:   HPI    Retina Follow Up    Patient presents with  Other.  In both eyes.  Duration of 4 weeks.  Since onset it is stable.  I, the attending physician,  performed the HPI with the patient and updated documentation appropriately.          Comments    Pt here for 4 wk retinal follow up lattice degeneration w/ atrophic holes OU. S/P Laser OD on 5/4 and OS on 5/6. Pt states she is doing fine, vision is about the same. She was recently in the hospital diagnosed with COPD. She reports still seeing black dots, off and on.        Last edited by Bernarda Caffey, MD on 11/25/2020  4:32 PM. (History)    pt states no problems after laser procedure, she states her right eye has been running a draining, pt was recently hospitalized and dx with COPD  Referring physician: [name], MD [address] [place] [address]  HISTORICAL INFORMATION:   Selected notes from the MEDICAL RECORD NUMBER Referred by Dr. Truman Hayward:  Ocular Hx- PMH-    CURRENT MEDICATIONS: No current outpatient medications on file. (Ophthalmic Drugs)   No current facility-administered medications for this visit. (Ophthalmic Drugs)   Current Outpatient Medications (Other)  Medication Sig  . acetaminophen (TYLENOL) 500 MG tablet Take 500-1,000 mg by mouth every 4 (four) hours as needed for moderate pain.   Marland Kitchen albuterol (VENTOLIN HFA) 108 (90 Base) MCG/ACT inhaler Inhale 1-2 puffs into the lungs every 6 (six) hours as needed for wheezing or shortness of breath.  . Alcohol Swabs (B-D SINGLE USE SWABS REGULAR) PADS USE TWICE DAILY AS DIRECTED  . Ascorbic Acid (VITAMIN C) 1000 MG tablet Take 1,000 mg by mouth daily.  Marland Kitchen atorvastatin (LIPITOR) 10 MG tablet TAKE 1 TABLET EVERY EVENING. (Patient  taking differently: Take 10 mg by mouth daily.)  . B-D ULTRAFINE III SHORT PEN 31G X 8 MM MISC USE DAILY WITH VICTOZA AND/OR NOVOLOG.  . benzonatate (TESSALON PERLES) 100 MG capsule Take 1 capsule (100 mg total) by mouth 3 (three) times daily as needed for cough.  . Blood Glucose Calibration (TRUE METRIX LEVEL 1) Low SOLN Use as directed  Dx:e11.22  . Blood Glucose Monitoring Suppl (TRUE METRIX METER) w/Device KIT Use as directed to check blood sugars 2 times per day dx: e11.22  . carvedilol (COREG) 25 MG tablet TAKE 1 TABLET TWICE DAILY WITH A MEAL  . dexlansoprazole (DEXILANT) 60 MG capsule Take 1 capsule (60 mg total) by mouth daily.  . diclofenac sodium (VOLTAREN) 1 % GEL Apply 2 g topically 4 (four) times daily as needed (pain).  . ferrous sulfate 324 MG TBEC Take 324 mg by mouth 2 (two) times daily.  . fluticasone furoate-vilanterol (BREO ELLIPTA) 100-25 MCG/INH AEPB Inhale 1 puff into the lungs daily.  . folic acid-vitamin b complex-vitamin c-selenium-zinc (DIALYVITE) 3 MG TABS tablet Take 1 tablet by mouth daily.  . furosemide (LASIX) 80 MG tablet TAKE 1 TABLET DAILY ON MONDAY THROUGH FRIDAY, AND 1/2 TABLET ON SATURDAY AND SUNDAY  . glucose blood (TRUE METRIX BLOOD GLUCOSE TEST) test strip Use as directed to check blood sugars 2 times per day  dx: e11.22  . guaiFENesin-dextromethorphan (ROBITUSSIN DM) 100-10 MG/5ML syrup Take 10 mLs by mouth every 6 (six) hours as needed for cough.  . hydrALAZINE (APRESOLINE) 100 MG tablet Take 1 tablet (100 mg total) by mouth 3 (three) times daily.  . hydrocortisone cream 1 % Apply 1 application topically daily as needed for itching.   . insulin lispro (HUMALOG KWIKPEN) 100 UNIT/ML KwikPen Inject 0.02-0.04 mLs (2-4 Units total) into the skin 3 (three) times daily as needed (high blood sugar over 150).  . Isopropyl Alcohol (ALCOHOL WIPES) 70 % MISC Apply 1 each topically 2 (two) times daily.  . isosorbide mononitrate (IMDUR) 30 MG 24 hr tablet Take 1 tablet  (30 mg total) by mouth daily.  Marland Kitchen lamoTRIgine (LAMICTAL) 150 MG tablet Take 1 tablet (150 mg total) by mouth 2 (two) times daily.  Marland Kitchen levETIRAcetam (KEPPRA XR) 500 MG 24 hr tablet Take 3 tablets every night  . ondansetron (ZOFRAN) 4 MG tablet Take 4 mg by mouth every 8 (eight) hours as needed for nausea or vomiting.  Marland Kitchen OZEMPIC, 1 MG/DOSE, 4 MG/3ML SOPN INJECT 1 MG INTO THE SKIN ONCE A WEEK.  . Pancrelipase, Lip-Prot-Amyl, 25000-79000 units CPEP Take 1-2 capsules by mouth See admin instructions. Take 1 capsules in the morning with breakfast, 1 capsule with lunch, and 2 capsules with supper  . potassium chloride SA (KLOR-CON) 20 MEQ tablet TAKE 2 TABLETS EVERY DAY BUT WHEN TAKING METOLAZONE TAKE 3 TABLETS DAILY AS DIRECTED (Patient taking differently: Take 40 mEq by mouth daily.)  . predniSONE (DELTASONE) 10 MG tablet Take 4 tablets (40 mg total) by mouth daily for 3 days, THEN 2 tablets (20 mg total) daily for 3 days, THEN 1 tablet (10 mg total) daily for 3 days.  . sennosides-docusate sodium (SENOKOT-S) 8.6-50 MG tablet Take 1 tablet by mouth daily.  . sertraline (ZOLOFT) 50 MG tablet TAKE 1 TABLET EVERY DAY (Patient taking differently: Take 50 mg by mouth every evening.)  . tiotropium (SPIRIVA HANDIHALER) 18 MCG inhalation capsule Place 1 capsule (18 mcg total) into inhaler and inhale daily.  Marland Kitchen tolnaftate (TINACTIN) 1 % cream Apply 1 application topically daily as needed (foot fungus).   [name] SOLOSTAR 300 UNIT/ML Solostar Pen Inject 25 Units into the skin at bedtime.  . TRUEplus Lancets 33G MISC Use as directed to check blood sugars 2 times per day dx: e11.22   No current facility-administered medications for this visit. (Other)      REVIEW OF SYSTEMS: ROS    Positive for: Eyes, Respiratory   Last edited by Kingsley Spittle, COT on 11/25/2020  1:42 PM. (History)       ALLERGIES Allergies  Allergen Reactions  . Other Anaphylaxis and Rash    Bleach  . Penicillins Hives    Did it  involve swelling of the face/tongue/throat, SOB, or low BP? No Did it involve sudden or severe rash/hives, skin peeling, or any reaction on the inside of your mouth or nose? Yes Did you need to seek medical attention at a hospital or doctor's office? Yes When did it last happen?Childhood allergy  If all above answers are "NO", may proceed with cephalosporin use.    . Sulfa Antibiotics Hives  . Aspirin Other (See Comments)     On aspirin 81 mg - Rectal bleeding in dec 2018  . Codeine     Headache and makes the patient feel "off"    PAST MEDICAL HISTORY Past Medical History:  Diagnosis Date  . Abnormal  liver function     in the past.  . Anemia   . Arthritis    all over  . Bruises easily   . Cataract    right eye;immature  . CHF (congestive heart failure) ([place])   . Chronic back pain    stenosis  . Chronic cough   . Chronic kidney disease   . COPD (chronic obstructive pulmonary disease) ([place])   . COVID    aug/sept 2021  . Demand myocardial infarction [facility]) 2012   Demand Infarction in setting of Pancreatitis --> mild Troponin elevation, NON-OBSTRUCTIVE CAD  . Depression    takes Abilify daily as well as Zoloft  . Diastolic heart failure 3295   Grade 1 diastolic Dysfunction by Echo   . Diverticulosis   . DM (diabetes mellitus) ([place])    takes Victoza daily as well as Lantus and Humalog  . Empty sella ([place])    on MRI in 2009.  . Glaucoma   . Headache(784.0)    last migraine-4-44yr ago  . History of blood transfusion    no abnormal reaction noted  . History of colon polyps    benign  . HTN (hypertension)    takes BKinder Morgan Energydaily  . Hyperlipidemia    takes Lipitor daily  . Joint swelling   . Nocturia   . Obesity hypoventilation syndrome ([place]   . Obstructive sleep apnea 02/2018   Notably improved split-night study with weight loss from 270 (2017) down to 250 pounds (2019).  . Pancreatitis    takes Pancrelipase daily  . Parkinson's disease  ([place]    takes Sinemet daily  . Peripheral neuropathy   . Pneumonia 2012  . Seizures ([place]    takes Lamictal daily and Primidone nightly;last seizure 2wks ago  . Urinary urgency    With increased frequency  . Varicose veins of both lower extremities with pain    With edema.  Takes daily Lasix   Past Surgical History:  Procedure Laterality Date  . ABDOMINAL HYSTERECTOMY    . APPENDECTOMY    . BACK SURGERY    . Cardiac Event Monitor  September-October 2017   Sinus rhythm with occasional PACs and artifact. No arrhythmias besides one short run of tachycardia.  . CHOLECYSTECTOMY    . COLONOSCOPY N/A 08/15/2012   Procedure: COLONOSCOPY;  Surgeon: WArta Silence MD;  Location: [facility];  Service: Endoscopy;  Laterality: N/A;  . COLONOSCOPY WITH PROPOFOL Left 06/17/2017   Procedure: COLONOSCOPY WITH PROPOFOL;  Surgeon: KRonnette Juniper MD;  [place] [facility]  Service: Gastroenterology;  Laterality: Left;  . CORONARY CALCIUM SCORE AND CTA  04/2018   Coronary calcium score 71.9.  Very large, hyperdynamic LAD wrapping apex giving rise to PDA.  Moderate LAD-diagonal and circumflex plaque -> CT FFR suggested positive findings in distal D1, D2 and distal circumflex.  Referred for cath. ==> FALSE POSITIVE  . ESOPHAGOGASTRODUODENOSCOPY    . eye cysts Bilateral   . LASIK    . LEFT HEART CATH AND CORONARY ANGIOGRAPHY N/A 05/16/2018   Procedure: LEFT HEART CATH AND CORONARY ANGIOGRAPHY;  Surgeon: SBelva Crome MD;  Location: [facility] INVASIVE CV LAB:  Angiographically normal coronary arteries with LVEDP of 21 mmHg.  Large draping hyperdominant LAD that wraps the apex and provides distal half of the PDA.  Relatively small caliber distal Cx -->proxLPDA, non-dom RCA. No Cx or Diag lesions -- FALSE + CT FFR  . LEFT HEART CATH AND CORONARY ANGIOGRAPHY  2009/March 2012   2009: (Dr. KAlvie Heidelberg  Nonobstructive CAD; 2012: Minimal CAD --> false positive stress test  . NM MYOVIEW LTD  01/2016; 01/2018   a)LOW RISK.  No ischemia or infarction;; b) EF 55-60%. NO ST changes.  No ischemia or Infarction. NO significant RV enlargement.  LOW RISK.  Marland Kitchen Polysomnogram  02/2018   (Dr. Brett Fairy from neurology): Split study was not adequately done due to low AHI.  She slept reclined and had an AHI less than 10, prolonged hypoxemia and no hypercapnia. -->  She has home oxygen already prescribed, but did not meet criteria for nightly oxygen.  (Was noted that the patient did not cooperate well with study.  Titration was discussed.  Marland Kitchen PRESSURE SENSOR/CARDIOMEMS N/A 11/09/2019   Procedure: PRESSURE SENSOR/CARDIOMEMS;  Surgeon: Larey Dresser, MD;  Location: [place] CV LAB;  Service: Cardiovascular;  Laterality: N/A;  . RECTAL POLYPECTOMY    . RIGHT/LEFT HEART CATH AND CORONARY ANGIOGRAPHY N/A 04/06/2019   Procedure: RIGHT/LEFT HEART CATH AND CORONARY ANGIOGRAPHY;  Surgeon: Leonie Man, MD;  [facility] INVASIVE CV LAB; angiographically normal coronary arteries.  Moderately elevated AD LVEDP..  Mild pulmonary hypertension: PA P 56/14 mmHg-mean 31 mmHg.  RAP 10 mmHg.  RV P-EDP 54/4 mmHg - 12 mmHg.  PCWP 11-15 mmHg.  CO-CI: 7.35-3.73.  . TONSILLECTOMY    . TRANSTHORACIC ECHOCARDIOGRAM  01/2016; 05/2017   A) Normal LV chamber size with mod LVH pattern. EF 50-55%. Severe LA dilation. Mod RA dilation. PAP elevated at 38 mmHg;; B) Mild concentric LVH.  EF 55-60% & no RWMA.  Grade 2 DD.  Diastolic flattening of the ventricular septum == ? elevated PAP.  Mild aortic valve calcification/sclerosis.  Mod LA dilation.  Mild RV dilation & Mod RA dilation  . TRANSTHORACIC ECHOCARDIOGRAM  10/2017; 01/2018   a) EF 55-60%.  No RWMA,  Moderate LA dilation.  Mild LA dilation.  Peak PA pressures ~57 mmHg (moderate);;; b) EF 55-60%.  GRII DD.  Suggestion of RV volume overload with moderate RV dilation.  Severe LA and RA dilation.Marland Kitchen  PA pressure ~67%.  . TRANSTHORACIC ECHOCARDIOGRAM  01/2019   Normal LV size and function.  EF 50 to 55%.  Impaired  relaxation.  RV appears to have moderately reduced function.  Severely enlarged.  Cannot fully assess RV pressures.  Hypermobile interatrial septum.  Right atrium severely dilated.  . TUBAL LIGATION    . vaginal cyst removed     several times    FAMILY HISTORY Family History  Problem Relation Age of Onset  . Allergies Father   . Heart disease Father        before 33  . Heart failure Father   . Hypertension Father   . Hyperlipidemia Father   . Heart disease Mother   . Diabetes Mother   . Hypertension Mother   . Hyperlipidemia Mother   . Hypertension Sister   . Heart disease Sister        before 82  . Hyperlipidemia Sister   . Diabetes Brother   . Hypertension Brother   . Diabetes Sister   . Hypertension Sister   . Diabetes Son   . Hypertension Son   . Cancer Other     SOCIAL HISTORY Social History   Tobacco Use  . Smoking status: Former Smoker    Packs/day: 0.50    Years: 25.00    Pack years: 12.50    Types: Cigarettes  . Smokeless tobacco: Never Used  . Tobacco comment: quit smoking 20+ytrs ago  Vaping Use  .  Vaping Use: Never used  Substance Use Topics  . Alcohol use: No  . Drug use: No         OPHTHALMIC EXAM:  Base Eye Exam    Visual Acuity (Snellen - Linear)      Right Left   Dist cc 20/40 20/40 +2   Dist ph cc NI NI   Correction: Glasses       Tonometry (Tonopen, 1:50 PM)      Right Left   Pressure 8 10       Pupils      Dark Light Shape React APD   Right 3 2 Round Brisk None   Left 3 2 Round Brisk None       Visual Fields (Counting fingers)      Left Right    Full Full       Extraocular Movement      Right Left    Full, Ortho Full, Ortho       Neuro/Psych    Oriented x3: Yes   Mood/Affect: Normal       Dilation    Both eyes: 1.0% Mydriacyl, 2.5% Phenylephrine @ 1:50 PM        Slit Lamp and Fundus Exam    Slit Lamp Exam      Right Left   Lids/Lashes Dermatochalasis - upper lid Dermatochalasis - upper lid    Conjunctiva/Sclera mild melanosis mild melanosis   Cornea Mild arcus, Trace endo pigment Mild arcus, trace PEE, trace endo pigment   Anterior Chamber deep, clear, narrow angle, PI at 0900 deep, clear, narrow angle, PI at 0900   Iris Round and dilated, No NVI Round and dilated, No NVI   Lens PC IOL in good position with open PC PC IOL in good position with open PC   Vitreous Vitreous syneresis, Posterior vitreous detachment, Weiss ring, vitreous condensations improving Vitreous syneresis, Posterior vitreous detachment, Weiss ring, vitreous condensations       Fundus Exam      Right Left   Disc Pink and Sharp, focal PPA/PPP mild Pallor, Sharp rim   C/D Ratio 0.6 0.6   Macula Flat, Blunted foveal reflex, RPE mottling and clumping, No heme or edema Flat, Blunted foveal reflex, RPE mottling and clumping, No heme or edema   Vessels attenuated, Tortuous attenuated, Tortuous   Periphery Attached, pigmented CR scar/lattice at 0430, focal pigmented lattice at 0730 with atrophic hole and +focal SRF, shallow schisis anterior to lattice -- good laser changes surrounding both lesions, no new RT/RD Attached, focal pigmented lattice at 0430 and 0500 (atrophic hole) -- good laser surrounding both lesions, no new RT/RD        Refraction    Wearing Rx      Sphere Cylinder Axis Add   Right +0.00 +1.25 053 +2.25   Left -0.75 +1.00 118 +2.25          IMAGING AND PROCEDURES  Imaging and Procedures for 11/25/2020  OCT, Retina - OU - Both Eyes       Right Eye Quality was borderline. Central Foveal Thickness: 243. Progression has improved. Findings include abnormal foveal contour, no IRF, no SRF (Foveal notch; interval improvement of vitreous opacities; peripheral schisis and focal SRF with retinal break caught on widefield -- not imaged today).   Left Eye Quality was borderline. Central Foveal Thickness: 247. Progression has been stable. Findings include normal foveal contour, no IRF, no SRF (Rare  drusen).   Notes *Images captured and stored on drive  Diagnosis / Impression:  OD: Foveal notch; interval improvement of vitreous opacities; peripheral schisis and focal SRF with retinal break caught on widefield -- not imaged today OS: NFP, no IRF/SRF  Clinical management:  See below  Abbreviations: NFP - Normal foveal profile. CME - cystoid macular edema. PED - pigment epithelial detachment. IRF - intraretinal fluid. SRF - subretinal fluid. EZ - ellipsoid zone. ERM - epiretinal membrane. ORA - outer retinal atrophy. ORT - outer retinal tubulation. SRHM - subretinal hyper-reflective material. IRHM - intraretinal hyper-reflective material               ASSESSMENT/PLAN:    ICD-10-CM   1. Bilateral retinal lattice degeneration  H35.413   2. Retinal hole of both eyes  H33.323   3. Retinal detachment, right  H33.21   4. Retinal edema  H35.81 OCT, Retina - OU - Both Eyes  5. Right retinoschisis  H33.101   6. Diabetes mellitus type 2 without retinopathy ([place])  E11.9   7. Essential hypertension  I10   8. Hypertensive retinopathy of both eyes  H35.033   9. Pseudophakia, both eyes  Z96.1     1-4. Lattice degeneration w/ atrophic holes, both eyes - OD: focal pigmented lattice at 0430 and 0730 with atrophic hole, +focal SRF - OS: focal pigmented lattice at 0430 and 0500 (atrophic hole) - s/p laser retinopexy OD (05.04.22) -- good laser changes - s/p laser retinopexy OS (05.06.22) -- good laser changes - no new RT/RD - f/u 2-3 months, DFE, OCT  5. Retinoschisis OD  - shallow peripheral retinoschisis in IT quad, anterior to 0730 lattice w/ break  - discussed findings, prognosis and treatment options   - s/p laser retinopexy OD (05.04.22) along with pexy for lattice above  6. Diabetes mellitus, type 2 without retinopathy - The incidence, risk factors for progression, natural history and treatment options for diabetic retinopathy  were discussed with patient.   - The need for  close monitoring of blood glucose, blood pressure, and serum lipids, avoiding cigarette or any type of tobacco, and the need for long term follow up was also discussed with patient. - f/u in 1 year, sooner prn  7,8. Hypertensive retinopathy OU - discussed importance of tight BP control - monitor  9. Pseudophakia OU  - s/p CE/IOL OU  - IOL in good position, doing well - monitor    Ophthalmic Meds Ordered this visit:  No orders of the defined types were placed in this encounter.      Return for f/u 2-3 months, lattice degeneration OU, DFE, OCT.  There are no Patient Instructions on file for this visit.   Explained the diagnoses, plan, and follow up with the patient and they expressed understanding.  Patient expressed understanding of the importance of proper follow up care.   This document serves as a record of services personally performed by Gardiner Sleeper, MD, PhD. It was created on their behalf by San Jetty. Owens Shark, OA an ophthalmic technician. The creation of this record is the provider's dictation and/or activities during the visit.    Electronically signed by: San Jetty. Owens Shark, [place] 06.01.2022 4:33 PM  Gardiner Sleeper, M.D., Ph.D. Diseases & Surgery of the Retina and Vitreous [facility]  I have reviewed the above documentation for accuracy and completeness, and I agree with the above. Gardiner Sleeper, M.D., Ph.D. 11/25/20 4:33 PM   Abbreviations: M myopia (nearsighted); A astigmatism; H hyperopia (farsighted); P presbyopia; Mrx spectacle prescription;  CTL contact lenses; OD right eye; OS left eye; OU both eyes  XT exotropia; ET esotropia; PEK punctate epithelial keratitis; PEE punctate epithelial erosions; DES dry eye syndrome; MGD meibomian gland dysfunction; ATs artificial tears; PFAT's preservative free artificial tears; [place] nuclear sclerotic cataract; PSC posterior subcapsular cataract; ERM epi-retinal membrane; PVD posterior vitreous detachment;  RD retinal detachment; DM diabetes mellitus; DR diabetic retinopathy; NPDR non-proliferative diabetic retinopathy; PDR proliferative diabetic retinopathy; CSME clinically significant macular edema; DME diabetic macular edema; dbh dot blot hemorrhages; CWS cotton wool spot; POAG primary open angle glaucoma; C/D cup-to-disc ratio; HVF humphrey visual field; GVF goldmann visual field; OCT optical coherence tomography; IOP intraocular pressure; BRVO Branch retinal vein occlusion; CRVO central retinal vein occlusion; CRAO central retinal artery occlusion; BRAO branch retinal artery occlusion; RT retinal tear; SB scleral buckle; PPV pars plana vitrectomy; VH Vitreous hemorrhage; PRP panretinal laser photocoagulation; IVK intravitreal kenalog; VMT vitreomacular traction; MH Macular hole;  NVD neovascularization of the disc; NVE neovascularization elsewhere; AREDS age related eye disease study; ARMD age related macular degeneration; POAG primary open angle glaucoma; EBMD epithelial/anterior basement membrane dystrophy; ACIOL anterior chamber intraocular lens; IOL intraocular lens; PCIOL posterior chamber intraocular lens; Phaco/IOL phacoemulsification with intraocular lens placement; [place] photorefractive keratectomy; LASIK laser assisted in situ keratomileusis; HTN hypertension; DM diabetes mellitus; COPD chronic obstructive pulmonary disease

## 2020-11-24 ENCOUNTER — Telehealth: Payer: Medicare HMO

## 2020-11-25 ENCOUNTER — Ambulatory Visit (INDEPENDENT_AMBULATORY_CARE_PROVIDER_SITE_OTHER): Payer: Medicare HMO | Admitting: Ophthalmology

## 2020-11-25 ENCOUNTER — Encounter (INDEPENDENT_AMBULATORY_CARE_PROVIDER_SITE_OTHER): Payer: Self-pay | Admitting: Ophthalmology

## 2020-11-25 ENCOUNTER — Other Ambulatory Visit: Payer: Self-pay

## 2020-11-25 DIAGNOSIS — H3581 Retinal edema: Secondary | ICD-10-CM

## 2020-11-25 DIAGNOSIS — E119 Type 2 diabetes mellitus without complications: Secondary | ICD-10-CM | POA: Diagnosis not present

## 2020-11-25 DIAGNOSIS — H35033 Hypertensive retinopathy, bilateral: Secondary | ICD-10-CM | POA: Diagnosis not present

## 2020-11-25 DIAGNOSIS — I10 Essential (primary) hypertension: Secondary | ICD-10-CM

## 2020-11-25 DIAGNOSIS — Z961 Presence of intraocular lens: Secondary | ICD-10-CM

## 2020-11-25 DIAGNOSIS — H33101 Unspecified retinoschisis, right eye: Secondary | ICD-10-CM | POA: Diagnosis not present

## 2020-11-25 DIAGNOSIS — H3321 Serous retinal detachment, right eye: Secondary | ICD-10-CM | POA: Diagnosis not present

## 2020-11-25 DIAGNOSIS — H33323 Round hole, bilateral: Secondary | ICD-10-CM

## 2020-11-25 DIAGNOSIS — H35413 Lattice degeneration of retina, bilateral: Secondary | ICD-10-CM

## 2020-11-28 ENCOUNTER — Other Ambulatory Visit: Payer: Self-pay

## 2020-11-28 MED ORDER — FLUCONAZOLE 150 MG PO TABS: 150.0000 mg | ORAL_TABLET | Freq: Once | ORAL | 0 refills | Status: AC

## 2020-11-28 MED ORDER — TRUEPLUS LANCETS 33G MISC: 2 refills | Status: DC

## 2020-11-28 MED ORDER — TRUE METRIX LEVEL 1 LOW VI SOLN: 2 refills | Status: DC

## 2020-11-28 NOTE — Telephone Encounter (Signed)
I left the pt a message that her Humalog has been approved by her insurance company for coverage

## 2020-11-30 ENCOUNTER — Telehealth: Payer: Self-pay

## 2020-11-30 ENCOUNTER — Telehealth: Payer: Medicare HMO

## 2020-11-30 NOTE — Telephone Encounter (Signed)
  Care Management   Follow Up Note   11/30/2020 Name: [name] MRN: [id number] DOB: 05/11/1946   Referred by: [name], MD Reason for referral : Chronic Care Management (Unsuccessful call)   An unsuccessful telephone outreach was attempted today. The patient was referred to the case management team for assistance with care management and care coordination. SW left a HIPAA compliant voice message requesting a return call.  Follow Up Plan: The care management team will reach out to the patient again over the next 21 days.   Daneen Schick, BSW, CDP Social Worker, Certified Dementia Practitioner [place] [facility] [phone]  Total time spent performing care coordination and/or care management activities with the patient by phone or face to face = 10 minutes.

## 2020-12-05 ENCOUNTER — Ambulatory Visit (INDEPENDENT_AMBULATORY_CARE_PROVIDER_SITE_OTHER): Payer: Medicare HMO | Admitting: Pulmonary Disease

## 2020-12-05 ENCOUNTER — Other Ambulatory Visit: Payer: Self-pay

## 2020-12-05 VITALS — BP 116/72 | HR 82 | Temp 98.4°F | Ht 61.0 in | Wt 197.6 lb

## 2020-12-05 DIAGNOSIS — Z9989 Dependence on other enabling machines and devices: Secondary | ICD-10-CM

## 2020-12-05 DIAGNOSIS — J9611 Chronic respiratory failure with hypoxia: Secondary | ICD-10-CM | POA: Diagnosis not present

## 2020-12-05 DIAGNOSIS — G4733 Obstructive sleep apnea (adult) (pediatric): Secondary | ICD-10-CM

## 2020-12-05 NOTE — Progress Notes (Signed)
[name]    [id number]    [birth year]  Primary Care Physician:Sanders, Bailey Mech, MD  Referring Physician: [name], MD [address] [place],  [place] [address]  Chief complaint: Follow-up for post COVID-30  HPI: 75 year old with OSA.  Developed COVID-19 at the end of August.  Treated with monoclonal antibody on 02/23/2020. She was treated at home with remote health with dexamethasone, azithromycin, supplemental oxygen. She had a rough first week but feels that she is slowly improving.  Continues to have cough with white mucus  Supplemental oxygen stopped in December 2021 due to continued improvement  Pets: Has dogs, no cats, birds, farm animals Occupation: Retired Psychologist, counselling Exposures: No known exposures.  No mold, hot tub, Jacuzzi Smoking history: 13-pack-year smoker.  Quit in 1990 Travel history: Previously lived in [place].  No significant recent travel Relevant family history: No significant family history of lung disease  Interim history: Hospitalized from 5/22 to 11/18/2020 for acute respiratory failure secondary to bronchitis. She had a CT of the chest which showed chronic changes and no acute infiltrate.  She was treated with antibiotics, steroids.  Ultimately treated for heart failure with Lasix.  Post discharge she is making slow improvements.  Continues on inhalers.  States that breathing is stable Has hoarseness of voice which started with her recent hospitalization and continues Continues on Dexilant for acid reflux  Outpatient Encounter Medications as of 12/05/2020  Medication Sig   acetaminophen (TYLENOL) 500 MG tablet Take 500-1,000 mg by mouth every 4 (four) hours as needed for moderate pain.    albuterol (VENTOLIN HFA) 108 (90 Base) MCG/ACT inhaler Inhale 1-2 puffs into the lungs every 6 (six) hours as needed for wheezing or shortness of breath.   Alcohol Swabs (B-D SINGLE USE SWABS REGULAR) PADS USE TWICE DAILY AS DIRECTED    Ascorbic Acid (VITAMIN C) 1000 MG tablet Take 1,000 mg by mouth daily.   atorvastatin (LIPITOR) 10 MG tablet TAKE 1 TABLET EVERY EVENING. (Patient taking differently: Take 10 mg by mouth daily.)   B-D ULTRAFINE III SHORT PEN 31G X 8 MM MISC USE DAILY WITH VICTOZA AND/OR NOVOLOG.   benzonatate (TESSALON PERLES) 100 MG capsule Take 1 capsule (100 mg total) by mouth 3 (three) times daily as needed for cough.   Blood Glucose Calibration (TRUE METRIX LEVEL 1) Low SOLN Use as directed  Dx:e11.22   Blood Glucose Monitoring Suppl (TRUE METRIX METER) w/Device KIT Use as directed to check blood sugars 2 times per day dx: e11.22   carvedilol (COREG) 25 MG tablet TAKE 1 TABLET TWICE DAILY WITH A MEAL   dexlansoprazole (DEXILANT) 60 MG capsule Take 1 capsule (60 mg total) by mouth daily.   diclofenac sodium (VOLTAREN) 1 % GEL Apply 2 g topically 4 (four) times daily as needed (pain).   ferrous sulfate 324 MG TBEC Take 324 mg by mouth 2 (two) times daily.   fluticasone furoate-vilanterol (BREO ELLIPTA) 100-25 MCG/INH AEPB Inhale 1 puff into the lungs daily.   furosemide (LASIX) 80 MG tablet TAKE 1 TABLET DAILY ON MONDAY THROUGH FRIDAY, AND 1/2 TABLET ON SATURDAY AND SUNDAY   glucose blood (TRUE METRIX BLOOD GLUCOSE TEST) test strip Use as directed to check blood sugars 2 times per day dx: e11.22   guaiFENesin-dextromethorphan (ROBITUSSIN DM) 100-10 MG/5ML syrup Take 10 mLs by mouth every 6 (six) hours as needed for cough.   hydrALAZINE (APRESOLINE) 100 MG tablet Take 1 tablet (100 mg total)  by mouth 3 (three) times daily.   hydrocortisone cream 1 % Apply 1 application topically daily as needed for itching.    insulin lispro (HUMALOG KWIKPEN) 100 UNIT/ML KwikPen Inject 0.02-0.04 mLs (2-4 Units total) into the skin 3 (three) times daily as needed (high blood sugar over 150).   Isopropyl Alcohol (ALCOHOL WIPES) 70 % MISC Apply 1 each topically 2 (two) times daily.   isosorbide mononitrate (IMDUR) 30 MG 24 hr  tablet Take 1 tablet (30 mg total) by mouth daily.   lamoTRIgine (LAMICTAL) 150 MG tablet Take 1 tablet (150 mg total) by mouth 2 (two) times daily.   levETIRAcetam (KEPPRA XR) 500 MG 24 hr tablet Take 3 tablets every night   ondansetron (ZOFRAN) 4 MG tablet Take 4 mg by mouth every 8 (eight) hours as needed for nausea or vomiting.   OZEMPIC, 1 MG/DOSE, 4 MG/3ML SOPN INJECT 1 MG INTO THE SKIN ONCE A WEEK.   Pancrelipase, Lip-Prot-Amyl, 25000-79000 units CPEP Take 1-2 capsules by mouth See admin instructions. Take 1 capsules in the morning with breakfast, 1 capsule with lunch, and 2 capsules with supper   potassium chloride SA (KLOR-CON) 20 MEQ tablet TAKE 2 TABLETS EVERY DAY BUT WHEN TAKING METOLAZONE TAKE 3 TABLETS DAILY AS DIRECTED (Patient taking differently: Take 40 mEq by mouth daily.)   sennosides-docusate sodium (SENOKOT-S) 8.6-50 MG tablet Take 1 tablet by mouth daily.   sertraline (ZOLOFT) 50 MG tablet TAKE 1 TABLET EVERY DAY (Patient taking differently: Take 50 mg by mouth every evening.)   tiotropium (SPIRIVA HANDIHALER) 18 MCG inhalation capsule Place 1 capsule (18 mcg total) into inhaler and inhale daily.   tolnaftate (TINACTIN) 1 % cream Apply 1 application topically daily as needed (foot fungus).    TOUJEO SOLOSTAR 300 UNIT/ML Solostar Pen Inject 25 Units into the skin at bedtime.   TRUEplus Lancets 33G MISC Use as directed to check blood sugars 2 times per day dx: M19.62   folic acid-vitamin b complex-vitamin c-selenium-zinc (DIALYVITE) 3 MG TABS tablet Take 1 tablet by mouth daily. (Patient not taking: Reported on 12/05/2020)   No facility-administered encounter medications on file as of 12/05/2020.   Physical Exam: Blood pressure 122/78, pulse 70, temperature 98.4 F (36.9 C), temperature source Oral, height 5' 1"  (1.549 m), weight 217 lb 9.6 oz (98.7 kg), SpO2 90 %. Gen:      No acute distress HEENT:  EOMI, sclera anicteric Neck:     No masses; no thyromegaly Lungs:    Clear  to auscultation bilaterally; normal respiratory effort CV:         Regular rate and rhythm; no murmurs Abd:      + bowel sounds; soft, non-tender; no palpable masses, no distension Ext:    No edema; adequate peripheral perfusion Skin:      Warm and dry; no rash Neuro: alert and oriented x 3 Psych: normal mood and affect  Data Reviewed: Imaging: Chest x-ray 08/01/2019-low lung volumes with bibasal atelectasis, borderline cardiomegaly.  High-res CT 03/29/2020-scattered areas of peripheral groundglass opacity, 8 mm right upper lobe nodule CT chest 11/17/2020-subsegmental atelectasis at the base  I have reviewed the images personally.  PFTs: 10/26/2019 FVC 1.52 [75%], FEV1 1.29 [83%], F/F 85, TLC 3.77 [81%], DLCO 16.54 [94%] Normal test  Labs:  Assessment:  Post hospital follow-up for acute on chronic respiratory failure Was recently hospitalized for bronchitis, CHF States that breathing is stable at present Continue inhalers She continues to have hoarseness of forced from her hospitalization Continue  on antiacid medication. Consider ENT eval if hoarseness is not improved  Post COVID-19 Treated with monoclonal antibody, steroids and azithromycin Minimal ILD noted on lung imaging Continue monitoring  OSA Continue CPAP.  Follows with Dr. Elsworth Soho  Plan/Recommendations: Continue inhalers Antiacid medication  Follow-up in 4 to 6 months  Marshell Garfinkel MD [facility] 12/05/2020, 10:43 AM  CC: [name], MD

## 2020-12-05 NOTE — Patient Instructions (Signed)
Glad you are improving from your recent hospitalization Advice was resting continue antiacid medication If your hoarseness of voice is not improved then we may have to refer you to an ear nose throat doctor Continue inhalers Follow-up in 4 to 6 months

## 2020-12-06 ENCOUNTER — Ambulatory Visit (INDEPENDENT_AMBULATORY_CARE_PROVIDER_SITE_OTHER): Payer: Medicare HMO

## 2020-12-06 ENCOUNTER — Other Ambulatory Visit: Payer: Self-pay

## 2020-12-06 VITALS — BP 118/70 | HR 53 | Temp 98.1°F | Ht 61.0 in | Wt 196.2 lb

## 2020-12-06 DIAGNOSIS — R3915 Urgency of urination: Secondary | ICD-10-CM | POA: Diagnosis not present

## 2020-12-06 LAB — POCT URINALYSIS DIPSTICK
Bilirubin, UA: NEGATIVE
Blood, UA: NEGATIVE
Glucose, UA: NEGATIVE
Ketones, UA: NEGATIVE
Leukocytes, UA: NEGATIVE
Nitrite, UA: NEGATIVE
Protein, UA: NEGATIVE
Spec Grav, UA: 1.02 (ref 1.010–1.025)
Urobilinogen, UA: 0.2 E.U./dL
pH, UA: 6.5 (ref 5.0–8.0)

## 2020-12-06 MED ORDER — LAMOTRIGINE 150 MG PO TABS: 150.0000 mg | ORAL_TABLET | Freq: Two times a day (BID) | ORAL | 3 refills | Status: DC

## 2020-12-06 NOTE — Progress Notes (Signed)
Pt is here to repeat urine for possible yeast infection.

## 2020-12-07 ENCOUNTER — Other Ambulatory Visit: Payer: Self-pay

## 2020-12-09 ENCOUNTER — Telehealth: Payer: Self-pay

## 2020-12-09 ENCOUNTER — Other Ambulatory Visit: Payer: Self-pay

## 2020-12-09 MED ORDER — LAMOTRIGINE 150 MG PO TABS: 150.0000 mg | ORAL_TABLET | Freq: Two times a day (BID) | ORAL | 3 refills | Status: DC

## 2020-12-09 NOTE — Chronic Care Management (AMB) (Signed)
Chronic Care Management Pharmacy Assistant   Name: [name]  MRN: [id number] DOB: 05-Aug-1945   Reason for Encounter: Disease State/ Hypertension     Recent office visits:  11-10-2020 Bary Castilla, NP. Covid test negative. START Zithromax Zpak 250 mg Take 2 tablets (500 mg) on Day 1, followed by 1 tablet (250 mg) once daily on Days 2 through 5. START Tessalon 100 mg 3 times daily as needed.  11-23-2020 Bary Castilla, NP.  12-06-2020 Debbora Dus, [place]. Negative UA dipstick.  Recent consult visits:  10-26-2020 Bernarda Caffey, MD (Ophthalmology). START Prednisolone 1% eye drops 1 drop into right eye 4 times daily for 7 days. OCT, retina both eyes. 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  10-28-2020 Bernarda Caffey, MD (Ophthalmology).   11-25-2020 Bernarda Caffey, MD (Ophthalmology). OCT, retina both eyes  12-05-2020 Marshell Garfinkel, MD (Pulmonary).  Hospital visits:  Medication Reconciliation was completed by comparing discharge summary, patient's EMR and Pharmacy list, and upon discussion with patient.  Admitted to the hospital on 11-13-2020 due to Shortness of breath. Discharge date was 11-18-2020. Discharged from [facility]?Medications Started at [facility] Discharge:?? -started Breo Ellipta 100-25 mcg 1 puff daily for shortness of breath. Robitussin DM 100-10 mg syrup 10 ml every 6 hours as needed. Levaquin 750 mg daily for 5 days. On 11-19-2020 start Prednisone 10 mg Take 4 tablets by mouth daily for 3 days, THEN 2 tablets daily for 3 days, THEN 1 tablet daily for 3 days. Spiriva 18 mcg capsule place 1 capsule into inhaler and inhale once daily.  Medication Changes at Hospital Discharge: None  Medications Discontinued at Hospital Discharge: Zithromax z pak (therapy complete)  Medications that remain the same after Hospital Discharge:??  -All other medications will remain the same.    Medications: Outpatient Encounter  Medications as of 12/09/2020  Medication Sig Note   acetaminophen (TYLENOL) 500 MG tablet Take 500-1,000 mg by mouth every 4 (four) hours as needed for moderate pain.     albuterol (VENTOLIN HFA) 108 (90 Base) MCG/ACT inhaler Inhale 1-2 puffs into the lungs every 6 (six) hours as needed for wheezing or shortness of breath.    Alcohol Swabs (B-D SINGLE USE SWABS REGULAR) PADS USE TWICE DAILY AS DIRECTED    Ascorbic Acid (VITAMIN C) 1000 MG tablet Take 1,000 mg by mouth daily.    atorvastatin (LIPITOR) 10 MG tablet TAKE 1 TABLET EVERY EVENING. (Patient taking differently: Take 10 mg by mouth daily.) 11/14/2020: 90 day supply 09/20/2020   B-D ULTRAFINE III SHORT PEN 31G X 8 MM MISC USE DAILY WITH VICTOZA AND/OR NOVOLOG.    benzonatate (TESSALON PERLES) 100 MG capsule Take 1 capsule (100 mg total) by mouth 3 (three) times daily as needed for cough. 11/14/2020: 10 day supply 11/10/2020   Blood Glucose Calibration (TRUE METRIX LEVEL 1) Low SOLN Use as directed  Dx:e11.22    Blood Glucose Monitoring Suppl (TRUE METRIX METER) w/Device KIT Use as directed to check blood sugars 2 times per day dx: e11.22    carvedilol (COREG) 25 MG tablet TAKE 1 TABLET TWICE DAILY WITH A MEAL 11/14/2020: 90 day supply 08/18/2020   dexlansoprazole (DEXILANT) 60 MG capsule Take 1 capsule (60 mg total) by mouth daily. 11/14/2020: 90 day supply 10/31/2020   diclofenac sodium (VOLTAREN) 1 % GEL Apply 2 g topically 4 (four) times daily as needed (pain).    ferrous sulfate 324 MG TBEC Take 324 mg by mouth 2 (two) times  daily.    fluticasone furoate-vilanterol (BREO ELLIPTA) 100-25 MCG/INH AEPB Inhale 1 puff into the lungs daily.    folic acid-vitamin b complex-vitamin c-selenium-zinc (DIALYVITE) 3 MG TABS tablet Take 1 tablet by mouth daily. (Patient not taking: Reported on 12/05/2020)    furosemide (LASIX) 80 MG tablet TAKE 1 TABLET DAILY ON MONDAY THROUGH FRIDAY, AND 1/2 TABLET ON SATURDAY AND SUNDAY    glucose blood (TRUE METRIX BLOOD  GLUCOSE TEST) test strip Use as directed to check blood sugars 2 times per day dx: e11.22    guaiFENesin-dextromethorphan (ROBITUSSIN DM) 100-10 MG/5ML syrup Take 10 mLs by mouth every 6 (six) hours as needed for cough.    hydrALAZINE (APRESOLINE) 100 MG tablet Take 1 tablet (100 mg total) by mouth 3 (three) times daily. 11/14/2020: 90 day supply 10/21/2020   hydrocortisone cream 1 % Apply 1 application topically daily as needed for itching.     insulin lispro (HUMALOG KWIKPEN) 100 UNIT/ML KwikPen Inject 0.02-0.04 mLs (2-4 Units total) into the skin 3 (three) times daily as needed (high blood sugar over 150).    Isopropyl Alcohol (ALCOHOL WIPES) 70 % MISC Apply 1 each topically 2 (two) times daily.    isosorbide mononitrate (IMDUR) 30 MG 24 hr tablet Take 1 tablet (30 mg total) by mouth daily. 11/14/2020: 60 day supply 11/02/2020   lamoTRIgine (LAMICTAL) 150 MG tablet Take 1 tablet (150 mg total) by mouth 2 (two) times daily.    levETIRAcetam (KEPPRA XR) 500 MG 24 hr tablet Take 3 tablets every night 11/16/2020: Patient reports dose has been increased to 3 tabs but has not started yet.   ondansetron (ZOFRAN) 4 MG tablet Take 4 mg by mouth every 8 (eight) hours as needed for nausea or vomiting.    OZEMPIC, 1 MG/DOSE, 4 MG/3ML SOPN INJECT 1 MG INTO THE SKIN ONCE A WEEK. 11/14/2020: 84 day supply 10/28/2020   Pancrelipase, Lip-Prot-Amyl, 25000-79000 units CPEP Take 1-2 capsules by mouth See admin instructions. Take 1 capsules in the morning with breakfast, 1 capsule with lunch, and 2 capsules with supper 11/14/2020: 50 day supply 11/01/2020   potassium chloride SA (KLOR-CON) 20 MEQ tablet TAKE 2 TABLETS EVERY DAY BUT WHEN TAKING METOLAZONE TAKE 3 TABLETS DAILY AS DIRECTED (Patient taking differently: Take 40 mEq by mouth daily.) 11/14/2020: 90 day supply 08/24/2020   sennosides-docusate sodium (SENOKOT-S) 8.6-50 MG tablet Take 1 tablet by mouth daily.    sertraline (ZOLOFT) 50 MG tablet TAKE 1 TABLET EVERY DAY  (Patient taking differently: Take 50 mg by mouth every evening.) 11/14/2020: 90 day supply 08/12/2020   tiotropium (SPIRIVA HANDIHALER) 18 MCG inhalation capsule Place 1 capsule (18 mcg total) into inhaler and inhale daily.    tolnaftate (TINACTIN) 1 % cream Apply 1 application topically daily as needed (foot fungus).     TOUJEO SOLOSTAR 300 UNIT/ML Solostar Pen Inject 25 Units into the skin at bedtime. 11/14/2020: 90 day supply 09/01/2020   TRUEplus Lancets 33G MISC Use as directed to check blood sugars 2 times per day dx: e11.22    No facility-administered encounter medications on file as of 12/09/2020.   Reviewed chart prior to disease state call. Spoke with patient regarding BP  Recent Office Vitals: BP Readings from Last 3 Encounters:  12/06/20 118/70  12/05/20 116/72  11/23/20 124/60   Pulse Readings from Last 3 Encounters:  12/06/20 (!) 53  12/05/20 82  11/23/20 84    Wt Readings from Last 3 Encounters:  12/06/20 196 lb 3.2 oz (89 kg)  12/05/20 197 lb 9.6 oz (89.6 kg)  11/23/20 196 lb 3.2 oz (89 kg)     Kidney Function Lab Results  Component Value Date/Time   CREATININE 0.88 11/18/2020 02:33 AM   CREATININE 1.12 (H) 11/17/2020 01:22 AM   CREATININE 0.91 10/04/2016 11:44 AM   GFR 86.68 05/15/2016 01:35 PM   GFRNONAA >60 11/18/2020 02:33 AM   GFRNONAA 65 10/04/2016 11:44 AM   GFRAA 75 06/06/2020 04:00 PM   GFRAA 75 10/04/2016 11:44 AM    BMP Latest Ref Rng & Units 11/18/2020 11/17/2020 11/16/2020  Glucose 70 - 99 mg/dL 156(H) 277(H) 223(H)  BUN 8 - 23 mg/dL 21 27(H) 20  Creatinine 0.44 - 1.00 mg/dL 0.88 1.12(H) 0.95  BUN/Creat Ratio 12 - 28 - - -  Sodium 135 - 145 mmol/L 130(L) 125(L) 130(L)  Potassium 3.5 - 5.1 mmol/L 4.2 4.4 4.3  Chloride 98 - 111 mmol/L 90(L) 88(L) 89(L)  CO2 22 - 32 mmol/L 35(H) 28 30  Calcium 8.9 - 10.3 mg/dL 9.3 9.0 9.2    Current antihypertensive regimen:  Carvedilol 25 mg twice daily Hydralazine 25 mg 4 tablet 3 times daily (Patient states  some days when she's out she doesn't take 3 times a day just once)  How often are you checking your Blood Pressure? daily  Current home BP readings: Patient couldn't find BP log at the moment but last office visit on 12-06-2020 BP was 118/70.  What recent interventions/DTPs have been made by any provider to improve Blood Pressure control since last CPP Visit: Patient states she is taking medications as directed.  Any recent hospitalizations or ED visits since last visit with CPP? Yes  What diet changes have been made to improve Blood Pressure Control?  Patient states she's not a breakfast eater but does eat salad and meats that are baked only.  What exercise is being done to improve your Blood Pressure Control?  Patient states she walks her dog daily ,walks up and down the driveway and does chair excercises.  Adherence Review: Is the patient currently on ACE/ARB medication? No Does the patient have >5 day gap between last estimated fill dates? No  NOTES: Patient recent UA was negative but patient states she still has vaginal itching. Sent a message  [name] CMA with patient's concerns. Sent scheduling a message to schedule patient a telephone visit with [name] CPP on 01-24-2021 at 2:00.  Star Rating Drugs: Ozempic 1 mg- Last filled 10-28-2020 [address] [facility] Clinical Pharmacist Assistant [phone]

## 2020-12-12 ENCOUNTER — Telehealth: Payer: Self-pay

## 2020-12-12 NOTE — Telephone Encounter (Signed)
Appt scheduled

## 2020-12-13 ENCOUNTER — Telehealth: Payer: Medicare HMO

## 2020-12-13 ENCOUNTER — Telehealth: Payer: Self-pay

## 2020-12-13 NOTE — Telephone Encounter (Signed)
  Care Management   Follow Up Note   12/13/2020 Name: [name] MRN: [id number] DOB: 01/14/1946   Referred by: [name], MD Reason for referral : Chronic Care Management   A second unsuccessful telephone outreach was attempted today. The patient was referred to the case management team for assistance with care management and care coordination. SW left a HIPAA compliant voice message requesting a return call.  Follow Up Plan: The care management team will reach out to the patient again over the next 30 days.   Daneen Schick, BSW, CDP Social Worker, Certified Dementia Practitioner [facility] [phone]

## 2020-12-14 ENCOUNTER — Ambulatory Visit (INDEPENDENT_AMBULATORY_CARE_PROVIDER_SITE_OTHER): Payer: Medicare HMO | Admitting: Nurse Practitioner

## 2020-12-14 ENCOUNTER — Other Ambulatory Visit: Payer: Self-pay

## 2020-12-14 ENCOUNTER — Encounter: Payer: Self-pay | Admitting: Nurse Practitioner

## 2020-12-14 VITALS — BP 130/72 | HR 82 | Temp 98.0°F | Ht 61.0 in | Wt 197.0 lb

## 2020-12-14 DIAGNOSIS — N898 Other specified noninflammatory disorders of vagina: Secondary | ICD-10-CM | POA: Diagnosis not present

## 2020-12-14 MED ORDER — FLUCONAZOLE 150 MG PO TABS: ORAL_TABLET | ORAL | 0 refills | Status: DC

## 2020-12-14 NOTE — Patient Instructions (Signed)
Vaginal Yeast Infection, Adult  Vaginal yeast infection is a condition that causes vaginal discharge as well as soreness, swelling, and redness (inflammation) of the vagina. This is a common condition. Some women get this infectionfrequently. What are the causes? This condition is caused by a change in the normal balance of the yeast (candida) and bacteria that live in the vagina. This change causes an overgrowth ofyeast, which causes the inflammation. What increases the risk? The condition is more likely to develop in women who: Take antibiotic medicines. Have diabetes. Take birth control pills. Are pregnant. Douche often. Have a weak body defense system (immune system). Have been taking steroid medicines for a long time. Frequently wear tight clothing. What are the signs or symptoms? Symptoms of this condition include: White, thick, creamy vaginal discharge. Swelling, itching, redness, and irritation of the vagina. The lips of the vagina (vulva) may be affected as well. Pain or a burning feeling while urinating. Pain during sex. How is this diagnosed? This condition is diagnosed based on: Your medical history. A physical exam. A pelvic exam. Your health care provider will examine a sample of your vaginal discharge under a microscope. Your health care provider may send this sample for testing to confirm the diagnosis. How is this treated? This condition is treated with medicine. Medicines may be over-the-counter or prescription. You may be told to use one or more of the following: Medicine that is taken by mouth (orally). Medicine that is applied as a cream (topically). Medicine that is inserted directly into the vagina (suppository). Follow these instructions at home:  Lifestyle Do not have sex until your health care provider approves. Tell your sex partner that you have a yeast infection. That person should go to his or her health care provider and ask if they should also be  treated. Do not wear tight clothes, such as pantyhose or tight pants. Wear breathable cotton underwear. General instructions Take or apply over-the-counter and prescription medicines only as told by your health care provider. Eat more yogurt. This may help to keep your yeast infection from returning. Do not use tampons until your health care provider approves. Try taking a sitz bath to help with discomfort. This is a warm water bath that is taken while you are sitting down. The water should only come up to your hips and should cover your buttocks. Do this 3-4 times per day or as told by your health care provider. Do not douche. If you have diabetes, keep your blood sugar levels under control. Keep all follow-up visits as told by your health care provider. This is important. Contact a health care provider if: You have a fever. Your symptoms go away and then return. Your symptoms do not get better with treatment. Your symptoms get worse. You have new symptoms. You develop blisters in or around your vagina. You have blood coming from your vagina and it is not your menstrual period. You develop pain in your abdomen. Summary Vaginal yeast infection is a condition that causes discharge as well as soreness, swelling, and redness (inflammation) of the vagina. This condition is treated with medicine. Medicines may be over-the-counter or prescription. Take or apply over-the-counter and prescription medicines only as told by your health care provider. Do not douche. Do not have sex or use tampons until your health care provider approves. Contact a health care provider if your symptoms do not get better with treatment or your symptoms go away and then return. This information is not intended to replace   advice given to you by your health care provider. Make sure you discuss any questions you have with your healthcare provider. Document Revised: 06/01/2020 Document Reviewed: 10/28/2017 Elsevier Patient  Education  2022 [employer]  

## 2020-12-14 NOTE — Progress Notes (Signed)
This visit occurred during the SARS-CoV-2 public health emergency.  Safety protocols were in place, including screening questions prior to the visit, additional usage of staff PPE, and extensive cleaning of exam room while observing appropriate contact time as indicated for disinfecting solutions.  Subjective:     Patient ID: [name] , female    DOB: [birth year] , 75 y.o.   MRN: [id number]   Chief Complaint  Patient presents with   Vaginal Itching    HPI  Patient is here for vaginal itching. She does not have any discharge. She said she had this just recently but it came back. No other concerns today.   Vaginal Itching The patient's primary symptoms include genital itching and vaginal discharge. The patient's pertinent negatives include no pelvic pain. This is a new problem. The current episode started in the past 7 days. The problem occurs constantly. The problem has been gradually improving. The patient is experiencing no pain. The problem affects both sides. She is not pregnant. Pertinent negatives include no chills, dysuria, fever, flank pain, hematuria or urgency. The vaginal discharge was normal. She has not been passing clots. She has not been passing tissue. Nothing aggravates the symptoms. She has tried nothing for the symptoms. She is not sexually active.    Past Medical History:  Diagnosis Date   Abnormal liver function     in the past.   Anemia    Arthritis    all over   Bruises easily    Cataract    right eye;immature   CHF (congestive heart failure) (HCC)    Chronic back pain    stenosis   Chronic cough    Chronic kidney disease    COPD (chronic obstructive pulmonary disease) ([place])    COVID    aug/sept 2021   Demand myocardial infarction [facility]) 2012   Demand Infarction in setting of Pancreatitis --> mild Troponin elevation, NON-OBSTRUCTIVE CAD   Depression    takes Abilify daily as well as Zoloft   Diastolic heart failure 4627   Grade 1 diastolic  Dysfunction by Echo    Diverticulosis    DM (diabetes mellitus) ([place])    takes Victoza daily as well as Lantus and Humalog   Empty sella ([place])    on MRI in 2009.   Glaucoma    Headache(784.0)    last migraine-4-49yr ago   History of blood transfusion    no abnormal reaction noted   History of colon polyps    benign   HTN (hypertension)    takes Benicar,Imdur,and Bystolic daily   Hyperlipidemia    takes Lipitor daily   Joint swelling    Nocturia    [place]    Obstructive sleep apnea 02/2018   Notably improved split-night study with weight loss from 270 (2017) down to 250 pounds (2019).   Pancreatitis    takes Pancrelipase daily   [place]    takes Sinemet daily   Peripheral neuropathy    Pneumonia 2012   Seizures ([place]    takes Lamictal daily and Primidone nightly;last seizure 2wks ago   Urinary urgency    With increased frequency   Varicose veins of both lower extremities with pain    With edema.  Takes daily Lasix     Family History  Problem Relation Age of Onset   Allergies Father    Heart disease Father        before 680  Heart failure Father  Hypertension Father    Hyperlipidemia Father    Heart disease Mother    Diabetes Mother    Hypertension Mother    Hyperlipidemia Mother    Hypertension Sister    Heart disease Sister        before 80   Hyperlipidemia Sister    Diabetes Brother    Hypertension Brother    Diabetes Sister    Hypertension Sister    Diabetes Son    Hypertension Son    Cancer Other      Current Outpatient Medications:    fluconazole (DIFLUCAN) 150 MG tablet, Take 1 tablet by mouth now and take another tablet 72 hours later., Disp: 2 tablet, Rfl: 0   acetaminophen (TYLENOL) 500 MG tablet, Take 500-1,000 mg by mouth every 4 (four) hours as needed for moderate pain. , Disp: , Rfl:    albuterol (VENTOLIN HFA) 108 (90 Base) MCG/ACT inhaler, Inhale 1-2 puffs into the lungs every 6 (six)  hours as needed for wheezing or shortness of breath., Disp: , Rfl:    Alcohol Swabs (B-D SINGLE USE SWABS REGULAR) PADS, USE TWICE DAILY AS DIRECTED, Disp: 200 each, Rfl: 6   Ascorbic Acid (VITAMIN C) 1000 MG tablet, Take 1,000 mg by mouth daily., Disp: , Rfl:    atorvastatin (LIPITOR) 10 MG tablet, TAKE 1 TABLET EVERY EVENING. (Patient taking differently: Take 10 mg by mouth daily.), Disp: 90 tablet, Rfl: 2   B-D ULTRAFINE III SHORT PEN 31G X 8 MM MISC, USE DAILY WITH VICTOZA AND/OR NOVOLOG., Disp: 400 each, Rfl: 1   benzonatate (TESSALON PERLES) 100 MG capsule, Take 1 capsule (100 mg total) by mouth 3 (three) times daily as needed for cough., Disp: 30 capsule, Rfl: 1   Blood Glucose Calibration (TRUE METRIX LEVEL 1) Low SOLN, Use as directed  Dx:e11.22, Disp: 1 each, Rfl: 2   Blood Glucose Monitoring Suppl (TRUE METRIX METER) w/Device KIT, Use as directed to check blood sugars 2 times per day dx: e11.22, Disp: 1 kit, Rfl: 1   carvedilol (COREG) 25 MG tablet, TAKE 1 TABLET TWICE DAILY WITH A MEAL, Disp: 180 tablet, Rfl: 2   dexlansoprazole (DEXILANT) 60 MG capsule, Take 1 capsule (60 mg total) by mouth daily., Disp: 90 capsule, Rfl: 2   diclofenac sodium (VOLTAREN) 1 % GEL, Apply 2 g topically 4 (four) times daily as needed (pain)., Disp: , Rfl:    ferrous sulfate 324 MG TBEC, Take 324 mg by mouth 2 (two) times daily., Disp: , Rfl:    fluticasone furoate-vilanterol (BREO ELLIPTA) 100-25 MCG/INH AEPB, Inhale 1 puff into the lungs daily., Disp: 90 each, Rfl: 0   furosemide (LASIX) 80 MG tablet, TAKE 1 TABLET DAILY ON MONDAY THROUGH FRIDAY, AND 1/2 TABLET ON SATURDAY AND SUNDAY, Disp: 78 tablet, Rfl: 3   glucose blood (TRUE METRIX BLOOD GLUCOSE TEST) test strip, Use as directed to check blood sugars 2 times per day dx: e11.22, Disp: 300 each, Rfl: 2   guaiFENesin-dextromethorphan (ROBITUSSIN DM) 100-10 MG/5ML syrup, Take 10 mLs by mouth every 6 (six) hours as needed for cough., Disp: 118 mL, Rfl: 0    hydrALAZINE (APRESOLINE) 100 MG tablet, Take 1 tablet (100 mg total) by mouth 3 (three) times daily., Disp: 270 tablet, Rfl: 3   hydrocortisone cream 1 %, Apply 1 application topically daily as needed for itching. , Disp: , Rfl:    insulin lispro (HUMALOG KWIKPEN) 100 UNIT/ML KwikPen, Inject 0.02-0.04 mLs (2-4 Units total) into the skin 3 (three) times  daily as needed (high blood sugar over 150)., Disp: 15 mL, Rfl: 2   Isopropyl Alcohol (ALCOHOL WIPES) 70 % MISC, Apply 1 each topically 2 (two) times daily., Disp: 300 each, Rfl: 3   isosorbide mononitrate (IMDUR) 30 MG 24 hr tablet, Take 1 tablet (30 mg total) by mouth daily., Disp: 30 tablet, Rfl: 1   lamoTRIgine (LAMICTAL) 150 MG tablet, Take 1 tablet (150 mg total) by mouth 2 (two) times daily., Disp: 180 tablet, Rfl: 3   levETIRAcetam (KEPPRA XR) 500 MG 24 hr tablet, Take 3 tablets every night, Disp: 270 tablet, Rfl: 3   ondansetron (ZOFRAN) 4 MG tablet, Take 4 mg by mouth every 8 (eight) hours as needed for nausea or vomiting., Disp: , Rfl:    OZEMPIC, 1 MG/DOSE, 4 MG/3ML SOPN, INJECT 1 MG INTO THE SKIN ONCE A WEEK., Disp: 9 mL, Rfl: 3   Pancrelipase, Lip-Prot-Amyl, 25000-79000 units CPEP, Take 1-2 capsules by mouth See admin instructions. Take 1 capsules in the morning with breakfast, 1 capsule with lunch, and 2 capsules with supper, Disp: , Rfl:    potassium chloride SA (KLOR-CON) 20 MEQ tablet, TAKE 2 TABLETS EVERY DAY BUT WHEN TAKING METOLAZONE TAKE 3 TABLETS DAILY AS DIRECTED (Patient taking differently: Take 40 mEq by mouth daily.), Disp: 204 tablet, Rfl: 2   sennosides-docusate sodium (SENOKOT-S) 8.6-50 MG tablet, Take 1 tablet by mouth daily., Disp: , Rfl:    sertraline (ZOLOFT) 50 MG tablet, TAKE 1 TABLET EVERY DAY (Patient taking differently: Take 50 mg by mouth every evening.), Disp: 90 tablet, Rfl: 2   tiotropium (SPIRIVA HANDIHALER) 18 MCG inhalation capsule, Place 1 capsule (18 mcg total) into inhaler and inhale daily., Disp: 90  capsule, Rfl: 3   tolnaftate (TINACTIN) 1 % cream, Apply 1 application topically daily as needed (foot fungus). , Disp: , Rfl:    TOUJEO SOLOSTAR 300 UNIT/ML Solostar Pen, Inject 25 Units into the skin at bedtime., Disp: , Rfl:    TRUEplus Lancets 33G MISC, Use as directed to check blood sugars 2 times per day dx: e11.22, Disp: 300 each, Rfl: 2   Allergies  Allergen Reactions   Other Anaphylaxis and Rash    Bleach   Penicillins Hives    Did it involve swelling of the face/tongue/throat, SOB, or low BP? No Did it involve sudden or severe rash/hives, skin peeling, or any reaction on the inside of your mouth or nose? Yes Did you need to seek medical attention at a hospital or doctor's office? Yes When did it last happen?      Childhood allergy  If all above answers are "NO", may proceed with cephalosporin use.     Sulfa Antibiotics Hives   Aspirin Other (See Comments)     On aspirin 81 mg - Rectal bleeding in dec 2018   Codeine     Headache and makes the patient feel "off"     Review of Systems  Constitutional:  Negative for chills, fatigue and fever.  HENT:  Negative for congestion, postnasal drip, rhinorrhea, sinus pressure, sinus pain and sneezing.   Respiratory:  Negative for cough, chest tightness, shortness of breath and wheezing.   Cardiovascular:  Negative for chest pain and palpitations.  Endocrine: Negative for polydipsia, polyphagia and polyuria.  Genitourinary:  Positive for vaginal discharge. Negative for difficulty urinating, dysuria, flank pain, hematuria, pelvic pain and urgency.    Today's Vitals   12/14/20 1606  BP: 130/72  Pulse: 82  Temp: 98 F (36.7 C)  TempSrc: Oral  Weight: 197 lb (89.4 kg)  Height: 5' 1"  (1.549 m)   Body mass index is 37.22 kg/m.   Objective:  Physical Exam Constitutional:      Appearance: Normal appearance.  HENT:     Head: Normocephalic and atraumatic.  Cardiovascular:     Rate and Rhythm: Normal rate and regular rhythm.      Pulses: Normal pulses.     Heart sounds: Normal heart sounds. No murmur heard. Pulmonary:     Effort: Pulmonary effort is normal. No respiratory distress.     Breath sounds: Normal breath sounds. No wheezing.  Skin:    General: Skin is warm and dry.     Capillary Refill: Capillary refill takes less than 2 seconds.  Neurological:     Mental Status: She is alert.        Assessment And Plan:     1. Vaginal itching - Urine cytology ancillary only - fluconazole (DIFLUCAN) 150 MG tablet; Take 1 tablet by mouth now and take another tablet 72 hours later.  Dispense: 2 tablet; Refill: 0 -will check for yeast infection.  -chronic -Side effects and appropriate use of all the medication(s) were discussed with the patient today. Patient advised to use the medication(s) as directed by their healthcare provider. The patient was encouraged to read, review, and understand all associated package inserts and contact our office with any questions or concerns. The patient accepts the risks of the treatment plan and had an opportunity to ask questions.   Follow up: if symptoms persist or do not get better.   The patient was encouraged to call or send a message through [place] for any questions or concerns.   Patient was given opportunity to ask questions. Patient verbalized understanding of the plan and was able to repeat key elements of the plan. All questions were answered to their satisfaction.  Raman Garald Rhew, DNP   I, Raman Ajai Terhaar have reviewed all documentation for this visit. The documentation on 10/14/20 for the exam, diagnosis, procedures, and orders are all accurate and complete.     Patient was given opportunity to ask questions. Patient verbalized understanding of the plan and was able to repeat key elements of the plan. All questions were answered to their satisfaction.  Bary Castilla, NP   I, Bary Castilla, NP, have reviewed all documentation for this visit. The documentation on  12/14/20 for the exam, diagnosis, procedures, and orders are all accurate and complete.   IF YOU HAVE BEEN REFERRED TO A SPECIALIST, IT MAY TAKE 1-2 WEEKS TO SCHEDULE/PROCESS THE REFERRAL. IF YOU HAVE NOT HEARD FROM US/SPECIALIST IN TWO WEEKS, PLEASE GIVE [place] A CALL AT [phone] X [phone].   THE PATIENT IS ENCOURAGED TO PRACTICE SOCIAL DISTANCING DUE TO THE COVID-19 PANDEMIC.

## 2020-12-22 DIAGNOSIS — G4733 Obstructive sleep apnea (adult) (pediatric): Secondary | ICD-10-CM | POA: Diagnosis not present

## 2020-12-23 ENCOUNTER — Emergency Department (HOSPITAL_COMMUNITY): Payer: Medicare HMO

## 2020-12-23 ENCOUNTER — Other Ambulatory Visit: Payer: Self-pay

## 2020-12-23 ENCOUNTER — Emergency Department (HOSPITAL_COMMUNITY)
Admission: EM | Admit: 2020-12-23 | Discharge: 2020-12-24 | Disposition: A | Discharge status: Home / Self Care | Payer: Medicare HMO | Attending: Emergency Medicine | Admitting: Emergency Medicine

## 2020-12-23 ENCOUNTER — Encounter (HOSPITAL_COMMUNITY): Payer: Self-pay | Admitting: *Deleted

## 2020-12-23 DIAGNOSIS — R079 Chest pain, unspecified: Secondary | ICD-10-CM | POA: Diagnosis not present

## 2020-12-23 DIAGNOSIS — E1122 Type 2 diabetes mellitus with diabetic chronic kidney disease: Secondary | ICD-10-CM | POA: Insufficient documentation

## 2020-12-23 DIAGNOSIS — Z87891 Personal history of nicotine dependence: Secondary | ICD-10-CM | POA: Insufficient documentation

## 2020-12-23 DIAGNOSIS — Z8616 Personal history of COVID-19: Secondary | ICD-10-CM | POA: Insufficient documentation

## 2020-12-23 DIAGNOSIS — I25118 Atherosclerotic heart disease of native coronary artery with other forms of angina pectoris: Secondary | ICD-10-CM | POA: Insufficient documentation

## 2020-12-23 DIAGNOSIS — Z79899 Other long term (current) drug therapy: Secondary | ICD-10-CM | POA: Diagnosis not present

## 2020-12-23 DIAGNOSIS — I130 Hypertensive heart and chronic kidney disease with heart failure and stage 1 through stage 4 chronic kidney disease, or unspecified chronic kidney disease: Secondary | ICD-10-CM | POA: Diagnosis not present

## 2020-12-23 DIAGNOSIS — I517 Cardiomegaly: Secondary | ICD-10-CM | POA: Diagnosis not present

## 2020-12-23 DIAGNOSIS — N182 Chronic kidney disease, stage 2 (mild): Secondary | ICD-10-CM | POA: Diagnosis not present

## 2020-12-23 DIAGNOSIS — I959 Hypotension, unspecified: Secondary | ICD-10-CM | POA: Diagnosis not present

## 2020-12-23 DIAGNOSIS — J449 Chronic obstructive pulmonary disease, unspecified: Secondary | ICD-10-CM | POA: Insufficient documentation

## 2020-12-23 DIAGNOSIS — R0902 Hypoxemia: Secondary | ICD-10-CM | POA: Diagnosis not present

## 2020-12-23 DIAGNOSIS — G20 Parkinson's disease: Secondary | ICD-10-CM | POA: Insufficient documentation

## 2020-12-23 DIAGNOSIS — R202 Paresthesia of skin: Secondary | ICD-10-CM | POA: Insufficient documentation

## 2020-12-23 DIAGNOSIS — I5032 Chronic diastolic (congestive) heart failure: Secondary | ICD-10-CM | POA: Insufficient documentation

## 2020-12-23 DIAGNOSIS — J9811 Atelectasis: Secondary | ICD-10-CM | POA: Diagnosis not present

## 2020-12-23 DIAGNOSIS — Z7951 Long term (current) use of inhaled steroids: Secondary | ICD-10-CM | POA: Diagnosis not present

## 2020-12-23 DIAGNOSIS — R52 Pain, unspecified: Secondary | ICD-10-CM

## 2020-12-23 DIAGNOSIS — Z794 Long term (current) use of insulin: Secondary | ICD-10-CM | POA: Insufficient documentation

## 2020-12-23 DIAGNOSIS — R0789 Other chest pain: Secondary | ICD-10-CM | POA: Diagnosis not present

## 2020-12-23 LAB — CBC WITH DIFFERENTIAL/PLATELET
Abs Immature Granulocytes: 0.02 10*3/uL (ref 0.00–0.07)
Basophils Absolute: 0 10*3/uL (ref 0.0–0.1)
Basophils Relative: 1 %
Eosinophils Absolute: 0.1 10*3/uL (ref 0.0–0.5)
Eosinophils Relative: 1 %
HCT: 39.7 % (ref 36.0–46.0)
Hemoglobin: 11.8 g/dL — ABNORMAL LOW (ref 12.0–15.0)
Immature Granulocytes: 0 %
Lymphocytes Relative: 28 %
Lymphs Abs: 1.4 10*3/uL (ref 0.7–4.0)
MCH: 22.5 pg — ABNORMAL LOW (ref 26.0–34.0)
MCHC: 29.7 g/dL — ABNORMAL LOW (ref 30.0–36.0)
MCV: 75.8 fL — ABNORMAL LOW (ref 80.0–100.0)
Monocytes Absolute: 0.6 10*3/uL (ref 0.1–1.0)
Monocytes Relative: 11 %
Neutro Abs: 2.8 10*3/uL (ref 1.7–7.7)
Neutrophils Relative %: 59 %
Platelets: 393 10*3/uL (ref 150–400)
RBC: 5.24 MIL/uL — ABNORMAL HIGH (ref 3.87–5.11)
RDW: 17.7 % — ABNORMAL HIGH (ref 11.5–15.5)
WBC: 4.9 10*3/uL (ref 4.0–10.5)
nRBC: 0 % (ref 0.0–0.2)

## 2020-12-23 LAB — COMPREHENSIVE METABOLIC PANEL
ALT: 13 U/L (ref 0–44)
AST: 16 U/L (ref 15–41)
Albumin: 3.5 g/dL (ref 3.5–5.0)
Alkaline Phosphatase: 105 U/L (ref 38–126)
Anion gap: 8 (ref 5–15)
BUN: 12 mg/dL (ref 8–23)
CO2: 30 mmol/L (ref 22–32)
Calcium: 9.1 mg/dL (ref 8.9–10.3)
Chloride: 97 mmol/L — ABNORMAL LOW (ref 98–111)
Creatinine, Ser: 0.82 mg/dL (ref 0.44–1.00)
GFR, Estimated: >60 mL/min (ref >60)
Glucose, Bld: 97 mg/dL (ref 70–99)
Potassium: 3.6 mmol/L (ref 3.5–5.1)
Sodium: 135 mmol/L (ref 135–145)
Total Bilirubin: 0.6 mg/dL (ref 0.3–1.2)
Total Protein: 7.5 g/dL (ref 6.5–8.1)

## 2020-12-23 LAB — TROPONIN I (HIGH SENSITIVITY)
Troponin I (High Sensitivity): 8 ng/L (ref <18)
Troponin I (High Sensitivity): 6 ng/L (ref <18)

## 2020-12-23 NOTE — ED Provider Notes (Signed)
Emergency Medicine Provider Triage Evaluation Note  [name] , a 75 y.o. female  was evaluated in triage.  Pt complains of chest pain. Sx started around 5 pm. Reports 10/10 pain that radiated into the left arm with tingling. EMS was called and gave pt morphine and NTG and she states her sx have improved to 7/10. Initially had SOB, diaphoresis and nausea that have since alleviated. No other complaints.   Physical Exam  BP 122/73   Pulse 61   Temp 98.5 F (36.9 C) (Oral)   Resp 14   SpO2 95%  Gen:   Awake, no distress   Resp:  Normal effort  MSK:   Moves extremities without difficulty  Other:    Medical Decision Making  Medically screening exam initiated at 6:44 PM.  Appropriate orders placed.  ZEMIRA ZEHRING was informed that the remainder of the evaluation will be completed by another provider, this initial triage assessment does not replace that evaluation, and the importance of remaining in the ED until their evaluation is complete.     Rayna Sexton, PA-C 12/23/20 1858    Drenda Freeze, MD 12/23/20 [phone]

## 2020-12-23 NOTE — ED Triage Notes (Signed)
The pt arrived by gems from home chest pain all day  hx of chf  ems  placed nan iv gave sl nitrp  ems gave zofran for nausea and ms 4mg  iv   pain radiated to the lt arm

## 2020-12-24 ENCOUNTER — Other Ambulatory Visit: Payer: Self-pay | Admitting: Cardiology

## 2020-12-24 NOTE — ED Notes (Signed)
Patient verbalizes understanding of discharge instructions. Opportunity for questioning and answers were provided. Armband removed by staff, pt discharged from ED via wheelchair.  

## 2020-12-24 NOTE — ED Provider Notes (Signed)
[facility] Provider Note   CSN: [id number] Arrival date & time: 12/23/20  1833     History Chief Complaint  Patient presents with  . Chest Pain    [name] is a 75 y.o. female.  75 year old female with multi medical problems document below who presents to the emergency department today with chest pain.  Patient arrived 10 hours prior to my evaluation and states that her pain is been resolved for many hours now.  She states that it started while she was doing dishes as a sharp pain in her left chest that seem to go straight to the back like a knife.  She also has some tingling in her left arm.  And thus she was asked by her family to come here to be evaluated.  EMS gave her morphine nitroglycerin which seemed to help her symptoms.  No new shortness of breath, cough, fever or other associated symptoms.  No lower extremity edema.   Chest Pain     Past Medical History:  Diagnosis Date  . Abnormal liver function     in the past.  . Anemia   . Arthritis    all over  . Bruises easily   . Cataract    right eye;immature  . CHF (congestive heart failure) ([place])   . Chronic back pain    stenosis  . Chronic cough   . Chronic kidney disease   . COPD (chronic obstructive pulmonary disease) ([place])   . COVID    aug/sept 2021  . Demand myocardial infarction [facility]) 2012   Demand Infarction in setting of Pancreatitis --> mild Troponin elevation, NON-OBSTRUCTIVE CAD  . Depression    takes Abilify daily as well as Zoloft  . Diastolic heart failure 0865   Grade 1 diastolic Dysfunction by Echo   . Diverticulosis   . DM (diabetes mellitus) ([place])    takes Victoza daily as well as Lantus and Humalog  . Empty sella ([place])    on MRI in 2009.  . Glaucoma   . Headache(784.0)    last migraine-4-64yr ago  . History of blood transfusion    no abnormal reaction noted  . History of colon polyps    benign  . HTN (hypertension)    takes  BKinder Morgan Energydaily  . Hyperlipidemia    takes Lipitor daily  . Joint swelling   . Nocturia   . Obesity hypoventilation syndrome ([place]   . Obstructive sleep apnea 02/2018   Notably improved split-night study with weight loss from 270 (2017) down to 250 pounds (2019).  . Pancreatitis    takes Pancrelipase daily  . Parkinson's disease ([place]    takes Sinemet daily  . Peripheral neuropathy   . Pneumonia 2012  . Seizures ([place]    takes Lamictal daily and Primidone nightly;last seizure 2wks ago  . Urinary urgency    With increased frequency  . Varicose veins of both lower extremities with pain    With edema.  Takes daily Lasix    Patient Active Problem List   Diagnosis Date Noted  . SOB (shortness of breath) 11/14/2020  . Postinflammatory pulmonary fibrosis ([place] 06/08/2020  . Lung nodule 06/08/2020  . Chronic kidney disease, stage 2 (mild) 01/05/2020  . Hypertensive chronic kidney disease with stage 1 through stage 4 chronic kidney disease, or unspecified chronic kidney disease 01/05/2020  . Snoring 01/05/2020  . Neuroleptic-induced parkinsonism ([place] 09/28/2019  . Class 3 severe obesity due to excess calories with  serious comorbidity and body mass index (BMI) of 40.0 to 44.9 in adult [facility] 05/22/2019  . Pulmonary hypertension, unspecified ([place]) 04/06/2019  . Syncope 02/24/2019  . Rheumatoid arthritis ([place]) 01/21/2019  . Radial styloid tenosynovitis 11/24/2018  . Type 2 diabetes mellitus with stage 2 chronic kidney disease, with long-term current use of insulin ([place]) 07/21/2018  . Chest pain 05/15/2018  . 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 Excessive daytime sleepiness 01/30/2018  . Sleep-related hypoventilation 01/30/2018  . Recurrent depression ([place]) 11/06/2017  . OSA on CPAP 10/07/2017  . Todd's paralysis ([place])   . Chronic diastolic CHF (congestive heart failure), NYHA class 2 ([place])   . Chronic  respiratory failure with hypoxia ([place])   . Acute lower GI bleeding   . Stroke-like symptoms 06/15/2017  . Rectal bleeding 06/15/2017  . Dehydration 03/07/2017  . Medication management 12/24/2016  . Near syncope 03/09/2016  . Racing heart beat 03/09/2016  . Dyspnea 01/28/2016  . Spinal stenosis at L4-L5 level 10/04/2014  . Parkinsonism ([place]) 06/24/2013  . Lower abdominal pain 10/31/2012  . Type II diabetes mellitus, uncontrolled ([place]) 10/31/2012  . Seizure disorder ([place]) 10/31/2012  . Diverticulosis 10/31/2012  . Anemia associated with acute blood loss 08/14/2012  . Hematochezia 08/14/2012  . Varicose veins of bilateral lower extremities with other complications 46/27/0350  . Leg pain 03/21/2012  . Radicular pain of left lower extremity 03/21/2012  . Heart murmur 11/21/2011  . Infected pseudocyst of pancreas 10/28/2011  . Chronic pancreatitis ([place]) 10/28/2011  . Hypertensive heart disease with chronic diastolic congestive heart failure ([place]) 10/28/2011  . Dyslipidemia, goal LDL below 100 10/28/2011  . Morbid obesity with BMI of 45.0-49.9, adult ([place]) 10/28/2011  . Pseudoseizure 10/24/2011  . Gastroesophageal reflux 10/23/2011  . History of tobacco abuse 10/23/2011  . Hypoxemia 10/23/2011  . Disturbance in sleep behavior 09/03/2011  . Partial epilepsy with impairment of consciousness ([place]) 09/03/2011  . Tremor 09/03/2011  . Peripheral edema 04/14/2011  . Obesity hypoventilation syndrome ([place]) 10/26/2010  . Atherosclerotic heart disease of native coronary artery with other forms of angina pectoris ([place]) 08/28/2010  . Acute, but ill-defined, cerebrovascular disease 05/04/2008  . Arthropathy 05/04/2008  . Disequilibrium 05/04/2008  . Generalized tonic clonic epilepsy ([place]) 05/04/2008  . Increased frequency of urination 05/04/2008  . Insomnia, unspecified 05/04/2008  . Major depressive disorder, single episode, unspecified 05/04/2008  . Memory loss 05/04/2008  . Nausea  05/04/2008  . Other malaise and fatigue 05/04/2008  . Swelling of limb 05/04/2008    Past Surgical History:  Procedure Laterality Date  . ABDOMINAL HYSTERECTOMY    . APPENDECTOMY    . BACK SURGERY    . Cardiac Event Monitor  September-October 2017   Sinus rhythm with occasional PACs and artifact. No arrhythmias besides one short run of tachycardia.  . CHOLECYSTECTOMY    . COLONOSCOPY N/A 08/15/2012   Procedure: COLONOSCOPY;  Surgeon: Arta Silence, MD;  Location: [facility];  Service: Endoscopy;  Laterality: N/A;  . COLONOSCOPY WITH PROPOFOL Left 06/17/2017   Procedure: COLONOSCOPY WITH PROPOFOL;  Surgeon: [name], MD;  Location: [place];  Service: Gastroenterology;  Laterality: Left;  . CORONARY CALCIUM SCORE AND CTA  04/2018   Coronary calcium score 71.9.  Very large, hyperdynamic LAD wrapping apex giving rise to PDA.  Moderate LAD-diagonal and circumflex plaque -> CT FFR suggested positive findings in distal D1, D2 and distal circumflex.  Referred for cath. ==> FALSE POSITIVE  .  ESOPHAGOGASTRODUODENOSCOPY    . eye cysts Bilateral   . LASIK    . LEFT HEART CATH AND CORONARY ANGIOGRAPHY N/A 05/16/2018   Procedure: LEFT HEART CATH AND CORONARY ANGIOGRAPHY;  Surgeon: Belva Crome, MD;  [facility]: [facility] INVAS[facility] CV LAB:  Angiographically normal coronary arteries with LVEDP of 21 mmHg.  Large draping hyperdominant LAD that wraps the apex and provides distal half of the PDA.  Relatively small caliber distal Cx -->proxLPDA, non-dom RCA. No Cx or Diag lesions -- FALSE + CT FFR  . LEFT HEART CATH AND CORONARY ANGIOGRAPHY  2009/March 2012   2009: (Dr. Alvie Heidelberg) Nonobstructive CAD; 2012: Minimal CAD --> false positive stress test  . NM MYOVIEW LTD  01/2016; 01/2018   a)LOW RISK. No ischemia or infarction;; b) EF 55-60%. NO ST changes.  No ischemia or Infarction. NO significant RV enlargement.  LOW RISK.  Marland Kitchen Polysomnogram  02/2018   (Dr. Brett Fairy from neurology): Split study was not  adequately done due to low AHI.  She slept reclined and had an AHI less than 10, prolonged hypoxemia and no hypercapnia. -->  She has home oxygen already prescribed, but did not meet criteria for nightly oxygen.  (Was noted that the patient did not cooperate well with study.  Titration was discussed.  Marland Kitchen PRESSURE SENSOR/CARDIOMEMS N/A 11/09/2019   Procedure: PRESSURE SENSOR/CARDIOMEMS;  Surgeon: Larey Dresser, MD;  Location: [facility] CV LAB;  Service: Cardiovascular;  Laterality: N/A;  . RECTAL POLYPECTOMY    . RIGHT/LEFT HEART CATH AND CORONARY ANGIOGRAPHY N/A 04/06/2019   Procedure: RIGHT/LEFT HEART CATH AND CORONARY ANGIOGRAPHY;  Surgeon: Leonie Man, MD;  Location: [facility] INVASIVE CV LAB; angiographically normal coronary arteries.  Moderately elevated AD LVEDP..  Mild pulmonary hypertension: PA P 56/14 mmHg-mean 31 mmHg.  RAP 10 mmHg.  RV P-EDP 54/4 mmHg - 12 mmHg.  PCWP 11-15 mmHg.  CO-CI: 7.35-3.73.  . TONSILLECTOMY    . TRANSTHORACIC ECHOCARDIOGRAM  01/2016; 05/2017   A) Normal LV chamber size with mod LVH pattern. EF 50-55%. Severe LA dilation. Mod RA dilation. PAP elevated at 38 mmHg;; B) Mild concentric LVH.  EF 55-60% & no RWMA.  Grade 2 DD.  Diastolic flattening of the ventricular septum == ? elevated PAP.  Mild aortic valve calcification/sclerosis.  Mod LA dilation.  Mild RV dilation & Mod RA dilation  . TRANSTHORACIC ECHOCARDIOGRAM  10/2017; 01/2018   a) EF 55-60%.  No RWMA,  Moderate LA dilation.  Mild LA dilation.  Peak PA pressures ~57 mmHg (moderate);;; b) EF 55-60%.  GRII DD.  Suggestion of RV volume overload with moderate RV dilation.  Severe LA and RA dilation.Marland Kitchen  PA pressure ~67%.  . TRANSTHORACIC ECHOCARDIOGRAM  01/2019   Normal LV size and function.  EF 50 to 55%.  Impaired relaxation.  RV appears to have moderately reduced function.  Severely enlarged.  Cannot fully assess RV pressures.  Hypermobile interatrial septum.  Right atrium severely dilated.  . TUBAL LIGATION    .  vaginal cyst removed     several times     OB History   No obstetric history on file.     Family History  Problem Relation Age of Onset  . Allergies Father   . Heart disease Father        before 74  . Heart failure Father   . Hypertension Father   . Hyperlipidemia Father   . Heart disease Mother   . Diabetes Mother   . Hypertension Mother   .  Hyperlipidemia Mother   . Hypertension Sister   . Heart disease Sister        before 44  . Hyperlipidemia Sister   . Diabetes Brother   . Hypertension Brother   . Diabetes Sister   . Hypertension Sister   . Diabetes Son   . Hypertension Son   . Cancer Other     Social History   Tobacco Use  . Smoking status: Former    Packs/day: 0.50    Years: 25.00    Pack years: 12.50    Types: Cigarettes  . Smokeless tobacco: Never  . Tobacco comments:    quit smoking 20+ytrs ago  Vaping Use  . Vaping Use: Never used  Substance Use Topics  . Alcohol use: No  . Drug use: No    Home Medications Prior to Admission medications   Medication Sig Start Date End Date Taking? Authorizing Provider  acetaminophen (TYLENOL) 500 MG tablet Take 500-1,000 mg by mouth every 4 (four) hours as needed for moderate pain.     [provider]  albuterol (VENTOLIN HFA) 108 (90 Base) MCG/ACT inhaler Inhale 1-2 puffs into the lungs every 6 (six) hours as needed for wheezing or shortness of breath.    [provider]  Alcohol Swabs (B-D SINGLE USE SWABS REGULAR) PADS USE TWICE DAILY AS DIRECTED 08/31/20   [name], MD  Ascorbic Acid (VITAMIN C) 1000 MG tablet Take 1,000 mg by mouth daily.    [provider]  atorvastatin (LIPITOR) 10 MG tablet TAKE 1 TABLET EVERY EVENING. Patient taking differently: Take 10 mg by mouth daily. 06/22/20   [name], MD  B-D ULTRAFINE III SHORT PEN 31G X 8 MM MISC USE DAILY WITH VICTOZA AND/OR NOVOLOG. 07/08/20   [name], MD  benzonatate (TESSALON PERLES) 100 MG capsule Take 1 capsule  (100 mg total) by mouth 3 (three) times daily as needed for cough. 11/10/20 11/10/21  Bary Castilla, NP  Blood Glucose Calibration (TRUE METRIX LEVEL 1) Low SOLN Use as directed  Dx:e11.22 11/28/20   [name], MD  Blood Glucose Monitoring Suppl (TRUE METRIX METER) w/Device KIT Use as directed to check blood sugars 2 times per day dx: e11.22 03/07/20   [name], MD  carvedilol (COREG) 25 MG tablet TAKE 1 TABLET TWICE DAILY WITH A MEAL 08/16/20   Leonie Man, MD  dexlansoprazole (DEXILANT) 60 MG capsule Take 1 capsule (60 mg total) by mouth daily. 11/08/20   [name], MD  diclofenac sodium (VOLTAREN) 1 % GEL Apply 2 g topically 4 (four) times daily as needed (pain). 05/17/18   Geradine Girt, DO  ferrous sulfate 324 MG TBEC Take 324 mg by mouth 2 (two) times daily.    [provider]  fluconazole (DIFLUCAN) 150 MG tablet Take 1 tablet by mouth now and take another tablet 72 hours later. 12/14/20   Ghumman, Ramandeep, NP  fluticasone furoate-vilanterol (BREO ELLIPTA) 100-25 MCG/INH AEPB Inhale 1 puff into the lungs daily. 11/18/20 02/16/21  [place] Donnamarie Poag, DO  furosemide (LASIX) 80 MG tablet TAKE 1 TABLET DAILY ON MONDAY THROUGH FRIDAY, AND 1/2 TABLET ON SATURDAY AND SUNDAY 11/24/20   Larey Dresser, MD  glucose blood (TRUE METRIX BLOOD GLUCOSE TEST) test strip Use as directed to check blood sugars 2 times per day dx: e11.22 03/22/20   [name], MD  guaiFENesin-dextromethorphan (ROBITUSSIN DM) 100-10 MG/5ML syrup Take 10 mLs by mouth every 6 (six) hours as needed for cough. 11/18/20  [place] Donnamarie Poag, DO  hydrALAZINE (APRESOLINE) 100 MG tablet Take 1 tablet (100 mg total) by mouth 3 (three) times daily. 10/21/20   Larey Dresser, MD  hydrocortisone cream 1 % Apply 1 application topically daily as needed for itching.     [provider]  insulin lispro (HUMALOG KWIKPEN) 100 UNIT/ML KwikPen Inject 0.02-0.04 mLs (2-4 Units total) into the skin 3 (three) times daily  as needed (high blood sugar over 150). 11/23/20   Bary Castilla, NP  Isopropyl Alcohol (ALCOHOL WIPES) 70 % MISC Apply 1 each topically 2 (two) times daily. 06/25/19   Minette Brine, FNP  isosorbide mononitrate (IMDUR) 30 MG 24 hr tablet Take 1 tablet (30 mg total) by mouth daily. 11/01/20 11/01/21  Leonie Man, MD  lamoTRIgine (LAMICTAL) 150 MG tablet Take 1 tablet (150 mg total) by mouth 2 (two) times daily. 12/09/20   [name], MD  levETIRAcetam (KEPPRA XR) 500 MG 24 hr tablet Take 3 tablets every night 10/26/20   Cameron Sprang, MD  ondansetron [facility]) 4 MG tablet Take 4 mg by mouth every 8 (eight) hours as needed for nausea or vomiting. 02/22/20   [provider]  OZEMPIC, 1 MG/DOSE, 4 MG/3ML SOPN INJECT 1 MG INTO THE SKIN ONCE A WEEK. 11/08/20   [name], MD  Pancrelipase, Lip-Prot-Amyl, 25000-79000 units CPEP Take 1-2 capsules by mouth See admin instructions. Take 1 capsules in the morning with breakfast, 1 capsule with lunch, and 2 capsules with supper    [provider]  potassium chloride SA (KLOR-CON) 20 MEQ tablet TAKE 2 TABLETS EVERY DAY BUT WHEN TAKING METOLAZONE TAKE 3 TABLETS DAILY AS DIRECTED Patient taking differently: Take 40 mEq by mouth daily. 08/31/20   Leonie Man, MD  sennosides-docusate sodium (SENOKOT-S) 8.6-50 MG tablet Take 1 tablet by mouth daily.    [provider]  sertraline (ZOLOFT) 50 MG tablet TAKE 1 TABLET EVERY DAY Patient taking differently: Take 50 mg by mouth every evening. 08/11/20   [name], MD  tiotropium (SPIRIVA HANDIHALER) 18 MCG inhalation capsule Place 1 capsule (18 mcg total) into inhaler and inhale daily. 11/18/20 11/18/21  [place] Donnamarie Poag, DO  tolnaftate (TINACTIN) 1 % cream Apply 1 application topically daily as needed (foot fungus).     [provider]  TOUJEO SOLOSTAR 300 UNIT/ML Solostar Pen Inject 25 Units into the skin at bedtime. 09/01/20   [provider]  TRUEplus Lancets 33G  MISC Use as directed to check blood sugars 2 times per day dx: e11.22 11/28/20   [name], MD    Allergies    Other, Penicillins, Sulfa antibiotics, Aspirin, and Codeine  Review of Systems   Review of Systems  Cardiovascular:  Positive for chest pain.  All other systems reviewed and are negative.  Physical Exam Updated Vital Signs BP 124/67 (BP Location: Left Arm)   Pulse (!) 56   Temp 98.6 F (37 C)   Resp 17   Ht _0  (1.549 m)   Wt 89.4 kg   SpO2 96%   BMI 37.24 kg/m   Physical Exam Vitals and nursing note reviewed.  Constitutional:      Appearance: She is well-developed.  HENT:     Head: Normocephalic and atraumatic.     Nose: Nose normal. No congestion or rhinorrhea.     Mouth/Throat:     Mouth: Mucous membranes are moist.     Pharynx: Oropharynx is clear.  Eyes:     Pupils: Pupils  are equal, round, and reactive to light.  Cardiovascular:     Rate and Rhythm: Normal rate and regular rhythm.  Pulmonary:     Effort: No respiratory distress.     Breath sounds: No stridor.  Abdominal:     General: There is no distension.     Palpations: Abdomen is soft.  Musculoskeletal:        General: Normal range of motion.     Cervical back: Normal range of motion.     Right lower leg: No tenderness. No edema.     Left lower leg: No tenderness. No edema.  Skin:    General: Skin is warm and dry.  Neurological:     General: No focal deficit present.     Mental Status: She is alert.    ED Results / Procedures / Treatments   Labs (all labs ordered are listed, but only abnormal results are displayed) Labs Reviewed  COMPREHENSIVE METABOLIC PANEL - Abnormal; Notable for the following components:      Result Value   Chloride 97 (*)    All other components within normal limits  CBC WITH DIFFERENTIAL/PLATELET - Abnormal; Notable for the following components:   RBC 5.24 (*)    Hemoglobin 11.8 (*)    MCV 75.8 (*)    MCH 22.5 (*)    MCHC 29.7 (*)    RDW 17.7 (*)     All other components within normal limits  TROPONIN I (HIGH SENSITIVITY)  TROPONIN I (HIGH SENSITIVITY)    EKG EKG Interpretation  Date/Time:  Friday December 23 2020 18:31:46 EDT Ventricular Rate:  65 PR Interval:  226 QRS Duration: 102 QT Interval:  426 QTC Calculation: 443 R Axis:   92 Text Interpretation: Sinus rhythm with marked sinus arrhythmia with 1st degree A-V block Rightward axis Incomplete right bundle branch block Borderline ECG Confirmed by Merrily Pew [phone]) on 12/24/2020 4:53:56 AM  Radiology DG Chest 2 View  Result Date: 12/23/2020 CLINICAL DATA:  Chest pain. EXAM: CHEST - 2 VIEW COMPARISON:  Nov 16, 2020 FINDINGS: Mild, diffuse, chronic appearing increased interstitial lung markings are seen. Mild areas of atelectasis are noted within the bilateral lung bases. There is no evidence of a pleural effusion or pneumothorax. Mild, predominant stable cardiomegaly is noted. Radiopaque surgical clips are seen within the right upper quadrant. Degenerative changes are seen throughout the thoracic spine. IMPRESSION: Stable cardiomegaly with mild bibasilar atelectasis. Electronically Signed   By: Virgina Norfolk M.D.   On: 12/23/2020 20:16    Procedures Procedures   Medications Ordered in ED Medications - No data to display  ED Course  I have reviewed the triage vital signs and the nursing notes.  Pertinent labs & imaging results that were available during my care of the patient were reviewed by me and considered in my medical decision making (see chart for details).    MDM Rules/Calculators/A&P                          Doubt ACS with resolution of pain, negative troponins, unchanged ECG. No risk factors or abnormal VS to suggest PE as the cause. Doubt dissection, no infectious symptoms or hypoxia to suggest recurrent pneumonia. Will plan for outpatient cards follow up.   Final Clinical Impression(s) / ED Diagnoses Final diagnoses:  Pain    Rx / DC Orders ED Discharge  Orders     None        Jacoba Cherney, Corene Cornea, MD 12/24/20  [id number]

## 2020-12-27 ENCOUNTER — Telehealth: Payer: Self-pay

## 2020-12-27 NOTE — Telephone Encounter (Signed)
Transition Care Management Follow-up Telephone Call Date of discharge and from where: 12/24/2020 [facility]  How have you been since you were released from the hospital? Pt said she feels a lot better.  Any questions or concerns? No  Items Reviewed: Did the pt receive and understand the discharge instructions provided? Yes  Medications obtained and verified? Yes  Other? Yes   Any new allergies since your discharge? No  Dietary orders reviewed? Yes Do you have support at home? Yes   Home Care and Equipment/Supplies: Were home health services ordered? not applicable If so, what is the name of the agency? N/a  Has the agency set up a time to come to the patient's home? not applicable Were any new equipment or medical supplies ordered?  No What is the name of the medical supply agency? N/a Were you able to get the supplies/equipment? not applicable Do you have any questions related to the use of the equipment or supplies? No  Functional Questionnaire: (I = Independent and D = Dependent) ADLs: I  Bathing/Dressing- I  Meal Prep- I  Eating- I  Maintaining continence- I  Transferring/Ambulation- I  Managing Meds- I  Follow up appointments reviewed:  PCP Hospital f/u appt confirmed? Scheduled to see on  [facility] f/u appt confirmed? No  Scheduled to see N/A on N/A @ N/A. Are transportation arrangements needed? No  If their condition worsens, is the pt aware to call PCP or go to the Emergency Dept.? YES  Was the patient provided with contact information for the PCP's office or ED? Yes Was to pt encouraged to call back with questions or concerns? Yes

## 2021-01-02 DIAGNOSIS — R2689 Other abnormalities of gait and mobility: Secondary | ICD-10-CM | POA: Diagnosis not present

## 2021-01-04 ENCOUNTER — Ambulatory Visit (INDEPENDENT_AMBULATORY_CARE_PROVIDER_SITE_OTHER): Payer: Medicare HMO

## 2021-01-04 DIAGNOSIS — I5032 Chronic diastolic (congestive) heart failure: Secondary | ICD-10-CM

## 2021-01-04 DIAGNOSIS — E1122 Type 2 diabetes mellitus with diabetic chronic kidney disease: Secondary | ICD-10-CM

## 2021-01-04 DIAGNOSIS — Z794 Long term (current) use of insulin: Secondary | ICD-10-CM | POA: Diagnosis not present

## 2021-01-04 DIAGNOSIS — N182 Chronic kidney disease, stage 2 (mild): Secondary | ICD-10-CM | POA: Diagnosis not present

## 2021-01-04 NOTE — Chronic Care Management (AMB) (Signed)
Chronic Care Management    Social Work Note  01/04/2021 Name: [name] MRN: [id number] DOB: 07-17-1945  [name] is a 75 y.o. year old female who is a primary care patient of [name], MD. The CCM team was consulted to assist the patient with chronic disease management and/or care coordination needs related to: Transportation Needs  and Home and Relationship stress .   Engaged with patient by telephone for follow up visit in response to provider referral for social work chronic care management and care coordination services.   Consent to Services:  The patient was given information about Chronic Care Management services, agreed to services, and gave verbal consent prior to initiation of services.  Please see initial visit note for detailed documentation.   Patient agreed to services and consent obtained.   Assessment: Review of patient past medical history, allergies, medications, and health status, including review of relevant consultants reports was performed today as part of a comprehensive evaluation and provision of chronic care management and care coordination services.     SDOH (Social Determinants of Health) assessments and interventions performed:    Advanced Directives Status: Not addressed in this encounter.  CCM Care Plan  Allergies  Allergen Reactions   Other Anaphylaxis and Rash    Bleach   Penicillins Hives    Did it involve swelling of the face/tongue/throat, SOB, or low BP? No Did it involve sudden or severe rash/hives, skin peeling, or any reaction on the inside of your mouth or nose? Yes Did you need to seek medical attention at a hospital or doctor's office? Yes When did it last happen?      Childhood allergy  If all above answers are "NO", may proceed with cephalosporin use.     Sulfa Antibiotics Hives   Aspirin Other (See Comments)     On aspirin 81 mg - Rectal bleeding in dec 2018   Codeine     Headache and makes the patient feel  "off"    Outpatient Encounter Medications as of 01/04/2021  Medication Sig Note   acetaminophen (TYLENOL) 500 MG tablet Take 500-1,000 mg by mouth every 4 (four) hours as needed for moderate pain.     albuterol (VENTOLIN HFA) 108 (90 Base) MCG/ACT inhaler Inhale 1-2 puffs into the lungs every 6 (six) hours as needed for wheezing or shortness of breath.    Alcohol Swabs (B-D SINGLE USE SWABS REGULAR) PADS USE TWICE DAILY AS DIRECTED    Ascorbic Acid (VITAMIN C) 1000 MG tablet Take 1,000 mg by mouth daily.    atorvastatin (LIPITOR) 10 MG tablet TAKE 1 TABLET EVERY EVENING. (Patient taking differently: Take 10 mg by mouth daily.) 11/14/2020: 90 day supply 09/20/2020   B-D ULTRAFINE III SHORT PEN 31G X 8 MM MISC USE DAILY WITH VICTOZA AND/OR NOVOLOG.    benzonatate (TESSALON PERLES) 100 MG capsule Take 1 capsule (100 mg total) by mouth 3 (three) times daily as needed for cough. 11/14/2020: 10 day supply 11/10/2020   Blood Glucose Calibration (TRUE METRIX LEVEL 1) Low SOLN Use as directed  Dx:e11.22    Blood Glucose Monitoring Suppl (TRUE METRIX METER) w/Device KIT Use as directed to check blood sugars 2 times per day dx: e11.22    carvedilol (COREG) 25 MG tablet TAKE 1 TABLET TWICE DAILY WITH A MEAL 11/14/2020: 90 day supply 08/18/2020   dexlansoprazole (DEXILANT) 60 MG capsule Take 1 capsule (60 mg total) by mouth daily. 11/14/2020: 90 day supply 10/31/2020   diclofenac sodium (  VOLTAREN) 1 % GEL Apply 2 g topically 4 (four) times daily as needed (pain).    ferrous sulfate 324 MG TBEC Take 324 mg by mouth 2 (two) times daily.    fluconazole (DIFLUCAN) 150 MG tablet Take 1 tablet by mouth now and take another tablet 72 hours later.    fluticasone furoate-vilanterol (BREO ELLIPTA) 100-25 MCG/INH AEPB Inhale 1 puff into the lungs daily.    furosemide (LASIX) 80 MG tablet TAKE 1 TABLET DAILY ON MONDAY THROUGH FRIDAY, AND 1/2 TABLET ON SATURDAY AND SUNDAY    glucose blood (TRUE METRIX BLOOD GLUCOSE TEST) test  strip Use as directed to check blood sugars 2 times per day dx: e11.22    guaiFENesin-dextromethorphan (ROBITUSSIN DM) 100-10 MG/5ML syrup Take 10 mLs by mouth every 6 (six) hours as needed for cough.    hydrALAZINE (APRESOLINE) 100 MG tablet Take 1 tablet (100 mg total) by mouth 3 (three) times daily. 11/14/2020: 90 day supply 10/21/2020   hydrocortisone cream 1 % Apply 1 application topically daily as needed for itching.     insulin lispro (HUMALOG KWIKPEN) 100 UNIT/ML KwikPen Inject 0.02-0.04 mLs (2-4 Units total) into the skin 3 (three) times daily as needed (high blood sugar over 150).    Isopropyl Alcohol (ALCOHOL WIPES) 70 % MISC Apply 1 each topically 2 (two) times daily.    isosorbide mononitrate (IMDUR) 30 MG 24 hr tablet TAKE 1 TABLET EVERY DAY    lamoTRIgine (LAMICTAL) 150 MG tablet Take 1 tablet (150 mg total) by mouth 2 (two) times daily.    levETIRAcetam (KEPPRA XR) 500 MG 24 hr tablet Take 3 tablets every night 11/16/2020: Patient reports dose has been increased to 3 tabs but has not started yet.   ondansetron (ZOFRAN) 4 MG tablet Take 4 mg by mouth every 8 (eight) hours as needed for nausea or vomiting.    OZEMPIC, 1 MG/DOSE, 4 MG/3ML SOPN INJECT 1 MG INTO THE SKIN ONCE A WEEK. 11/14/2020: 84 day supply 10/28/2020   Pancrelipase, Lip-Prot-Amyl, 25000-79000 units CPEP Take 1-2 capsules by mouth See admin instructions. Take 1 capsules in the morning with breakfast, 1 capsule with lunch, and 2 capsules with supper 11/14/2020: 50 day supply 11/01/2020   potassium chloride SA (KLOR-CON) 20 MEQ tablet TAKE 2 TABLETS EVERY DAY BUT WHEN TAKING METOLAZONE TAKE 3 TABLETS DAILY AS DIRECTED (Patient taking differently: Take 40 mEq by mouth daily.) 11/14/2020: 90 day supply 08/24/2020   sennosides-docusate sodium (SENOKOT-S) 8.6-50 MG tablet Take 1 tablet by mouth daily.    sertraline (ZOLOFT) 50 MG tablet TAKE 1 TABLET EVERY DAY (Patient taking differently: Take 50 mg by mouth every evening.)  11/14/2020: 90 day supply 08/12/2020   tiotropium (SPIRIVA HANDIHALER) 18 MCG inhalation capsule Place 1 capsule (18 mcg total) into inhaler and inhale daily.    tolnaftate (TINACTIN) 1 % cream Apply 1 application topically daily as needed (foot fungus).     TOUJEO SOLOSTAR 300 UNIT/ML Solostar Pen Inject 25 Units into the skin at bedtime. 11/14/2020: 90 day supply 09/01/2020   TRUEplus Lancets 33G MISC Use as directed to check blood sugars 2 times per day dx: e11.22    No facility-administered encounter medications on file as of 01/04/2021.    Patient Active Problem List   Diagnosis Date Noted   SOB (shortness of breath) 11/14/2020   Postinflammatory pulmonary fibrosis ([place]) 06/08/2020   Lung nodule 06/08/2020   Chronic kidney disease, stage 2 (mild) 01/05/2020   Hypertensive chronic kidney disease with stage 1  through stage 4 chronic kidney disease, or unspecified chronic kidney disease 01/05/2020   Snoring 01/05/2020   Neuroleptic-induced parkinsonism ([place]) 09/28/2019   Class 3 severe obesity due to excess calories with serious comorbidity and body mass index (BMI) of 40.0 to 44.9 in adult [facility]) 05/22/2019   Pulmonary hypertension, unspecified ([place]) 04/06/2019   Syncope 02/24/2019   Rheumatoid arthritis ([place]) 01/21/2019   Radial styloid tenosynovitis 11/24/2018   Type 2 diabetes mellitus with stage 2 chronic kidney disease, with long-term current use of insulin ([place]) 07/21/2018   Chest pain 05/15/2018   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 ([place]) 04/24/2018   Excessive daytime sleepiness 01/30/2018   Sleep-related hypoventilation 01/30/2018   Recurrent depression ([place]) 11/06/2017   OSA on CPAP 10/07/2017   Todd's paralysis (HCC)    Chronic diastolic CHF (congestive heart failure), NYHA class 2 (HCC)    Chronic respiratory failure with hypoxia (HCC)    Acute lower GI bleeding    Stroke-like symptoms 06/15/2017   Rectal bleeding 06/15/2017    Dehydration 03/07/2017   Medication management 12/24/2016   Near syncope 03/09/2016   Racing heart beat 03/09/2016   Dyspnea 01/28/2016   Spinal stenosis at L4-L5 level 10/04/2014   Parkinsonism ([place]) 06/24/2013   Lower abdominal pain 10/31/2012   Type II diabetes mellitus, uncontrolled ([place]) 10/31/2012   Seizure disorder ([place]) 10/31/2012   Diverticulosis 10/31/2012   Anemia associated with acute blood loss 08/14/2012   Hematochezia 08/14/2012   Varicose veins of bilateral lower extremities with other complications 99/35/7017   Leg pain 03/21/2012   Radicular pain of left lower extremity 03/21/2012   Heart murmur 11/21/2011   Infected pseudocyst of pancreas 10/28/2011   Chronic pancreatitis ([place]) 10/28/2011   Hypertensive heart disease with chronic diastolic congestive heart failure ([place]) 10/28/2011   Dyslipidemia, goal LDL below 100 10/28/2011   Morbid obesity with BMI of 45.0-49.9, adult ([place]) 10/28/2011   Pseudoseizure 10/24/2011   Gastroesophageal reflux 10/23/2011   History of tobacco abuse 10/23/2011   Hypoxemia 10/23/2011   Disturbance in sleep behavior 09/03/2011   Partial epilepsy with impairment of consciousness ([place]) 09/03/2011   Tremor 09/03/2011   Peripheral edema 04/14/2011   Obesity hypoventilation syndrome ([place]) 10/26/2010   Atherosclerotic heart disease of native coronary artery with other forms of angina pectoris ([place]) 08/28/2010   Acute, but ill-defined, cerebrovascular disease 05/04/2008   Arthropathy 05/04/2008   Disequilibrium 05/04/2008   Generalized tonic clonic epilepsy ([place]) 05/04/2008   Increased frequency of urination 05/04/2008   Insomnia, unspecified 05/04/2008   Major depressive disorder, single episode, unspecified 05/04/2008   Memory loss 05/04/2008   Nausea 05/04/2008   Other malaise and fatigue 05/04/2008   Swelling of limb 05/04/2008    Conditions to be addressed/monitored: CHF and DMII; Transportation  Care Plan : [facility] Social Work  Care Plan  Updates made by Daneen Schick since 01/04/2021 12:00 AM     Problem: Long-Term Care Planning Resolved 01/04/2021     Goal: Effective Long-Term Care Planning Completed 01/04/2021  Start Date: 09/16/2020  Expected End Date: 10/31/2020  Recent Progress: Not on track  Priority: High  Note:   Current Barriers:  Chronic disease management support and education needs related to CHF and DM  Limited social support Limited knowledge of how to obtain placement Unable to stay in current living situation due to family relationship dysfunction  Social Worker Clinical Goal(s):  Over the next 45 days the patient will work with SW to identify  ALF placement opportunities  CCM SW Interventions:  Inter-disciplinary care team collaboration (see longitudinal plan of care) Collaboration with [name], MD regarding development and update of comprehensive plan of care as evidenced by provider attestation and co-signature Successful outbound call placed to the patient to assist with care coordination needs Discussed the patient continues to live with her sister, while she would like to live on her own she acknowledges she cannot afford this as an option Goal closed      Problem: Barriers to Treatment Resolved 01/04/2021     Goal: Transportation Barriers to Treatment Identified and Managed Completed 01/04/2021  Start Date: 09/26/2020  Expected End Date: 11/10/2020  Recent Progress: On track  Priority: High  Note:   Current Barriers:  Chronic disease management support and education needs related to CHF and DM  Transportation  Social Worker Clinical Goal(s):   patient will work with SW to identify transportation resources  CCM SW Interventions:  Inter-disciplinary care team collaboration (see longitudinal plan of care) Collaboration with [name], MD regarding development and update of comprehensive plan of care as evidenced by provider attestation and co-signature Successful  outbound call placed to the patient to assess goal progression Determined the patient did receive approval in the mail indicating she is eligible to utilize [employer] for transportation until 12/23/23 Reviewed process of how to reserve a ride Discussed the patient may begin attending church again since she can access this resource for more than just physician appointments Educated the patient on option to pay per trip or by purchasing a punch card for multiple trips Goal Met  Patient Goals/Self-Care Activities patient will:   -  Access [employer] for transportation needs        Follow Up Plan: SW will follow up with patient by phone over the next 60 days      Daneen Schick, BSW, CDP Social Worker, Certified Dementia Practitioner [place] [facility] [place] [facility] [phone]

## 2021-01-04 NOTE — Patient Instructions (Signed)
Social Worker Visit Information  Goals we discussed today:   Goals Addressed             This Visit's Progress    COMPLETED: Effective Long Term Care Planning       Timeframe:  Short-Term Goal Priority:  High Start Date:    3.25.22                         Expected End Date: 3.29.22                      Goal closed - patient indicates she will remain in her sisters home      COMPLETED: Transportation Barriers to Treatment Identified and Managed       Timeframe:  Short-Term Goal Priority:  High Start Date:  4.4.22                           Expected End Date: 5.19.22                       Goal Met  Patient Goals/Self-Care Activities patient will:   - Patient will access [employer] for transportation needs          Materials Provided: Verbal education about [employer] resource provided by phone  Follow Up Plan: SW will follow up with patient by phone over the next two months   Daneen Schick, BSW, CDP Social Worker, Certified Dementia Practitioner [place] [facility] [place] [facility] [phone]

## 2021-01-05 ENCOUNTER — Encounter (HOSPITAL_COMMUNITY): Payer: Medicare HMO | Admitting: Cardiology

## 2021-01-09 ENCOUNTER — Other Ambulatory Visit: Payer: Self-pay

## 2021-01-09 ENCOUNTER — Encounter: Payer: Self-pay | Admitting: Internal Medicine

## 2021-01-09 ENCOUNTER — Ambulatory Visit (INDEPENDENT_AMBULATORY_CARE_PROVIDER_SITE_OTHER): Payer: Medicare HMO | Admitting: Internal Medicine

## 2021-01-09 VITALS — BP 124/72 | HR 68 | Temp 98.3°F | Ht 61.0 in | Wt 195.4 lb

## 2021-01-09 DIAGNOSIS — Z794 Long term (current) use of insulin: Secondary | ICD-10-CM

## 2021-01-09 DIAGNOSIS — E1122 Type 2 diabetes mellitus with diabetic chronic kidney disease: Secondary | ICD-10-CM | POA: Diagnosis not present

## 2021-01-09 DIAGNOSIS — F339 Major depressive disorder, recurrent, unspecified: Secondary | ICD-10-CM

## 2021-01-09 DIAGNOSIS — I5032 Chronic diastolic (congestive) heart failure: Secondary | ICD-10-CM | POA: Diagnosis not present

## 2021-01-09 DIAGNOSIS — N182 Chronic kidney disease, stage 2 (mild): Secondary | ICD-10-CM

## 2021-01-09 DIAGNOSIS — I110 Hypertensive heart disease with heart failure: Secondary | ICD-10-CM

## 2021-01-09 DIAGNOSIS — R0789 Other chest pain: Secondary | ICD-10-CM | POA: Diagnosis not present

## 2021-01-09 DIAGNOSIS — Z09 Encounter for follow-up examination after completed treatment for conditions other than malignant neoplasm: Secondary | ICD-10-CM

## 2021-01-09 NOTE — Progress Notes (Signed)
I,Katawbba Wiggins,acting as a Education administrator for [name], MD.,have documented all relevant documentation on the behalf of [name], MD,as directed by  [name], MD while in the presence of [name], MD.  This visit occurred during the SARS-CoV-2 public health emergency.  Safety protocols were in place, including screening questions prior to the visit, additional usage of staff PPE, and extensive cleaning of exam room while observing appropriate contact time as indicated for disinfecting solutions.  Subjective:     Patient ID: [name] , female    DOB: 1945-07-10 , 75 y.o.   MRN: [id number]   Chief Complaint  Patient presents with   Hospitalization Follow-up    HPI   The patient is here today for a hospital f/u.  She presented to ER on 7/1 w/ complaint of chest pain. She states she initially thought it was indigestion; however, when it persisted, she went to ER. Described as a sharp, stabbing pain. There was radiation down her left arm. She did have some associated nausea. She denies palpitations. Her sister states she appeared to have SOB and she was clammy. EMS was called. Sister states HR was irregular when taken by fireman; however, it had normalized by the time EMS arrived.  EMS gave her morphine nitroglycerin which seemed to help her chest discomfort.  ER workup was negative. ER physician doubted ACS with resolution of pain, negative troponins, unchanged ECG. No risk factors or abnormal VS to suggest PE as the cause. Doubt dissection, no infectious symptoms or hypoxia to suggest recurrent pneumonia. She has not had any recurrence of her sx since her d/c from ER.     Past Medical History:  Diagnosis Date   Abnormal liver function     in the past.   Anemia    Arthritis    all over   Bruises easily    Cataract    right eye;immature   CHF (congestive heart failure) (HCC)    Chronic back pain    stenosis   Chronic cough    Chronic kidney disease    COPD  (chronic obstructive pulmonary disease) ([place])    COVID    aug/sept 2021   Demand myocardial infarction [facility]) 2012   Demand Infarction in setting of Pancreatitis --> mild Troponin elevation, NON-OBSTRUCTIVE CAD   Depression    takes Abilify daily as well as Zoloft   Diastolic heart failure 8588   Grade 1 diastolic Dysfunction by Echo    Diverticulosis    DM (diabetes mellitus) ([place])    takes Victoza daily as well as Lantus and Humalog   Empty sella ([place])    on MRI in 2009.   Glaucoma    Headache(784.0)    last migraine-4-48yr ago   History of blood transfusion    no abnormal reaction noted   History of colon polyps    benign   HTN (hypertension)    takes Benicar,Imdur,and Bystolic daily   Hyperlipidemia    takes Lipitor daily   Joint swelling    Nocturia    Ob[place]oventilation syndrome [place]    Obstructive sleep apnea 02/2018   Notably improved split-night study with weight loss from 270 (2017) down to 250 pounds (2019).   Pancreatitis    takes Pancrelipase daily   Parkinson's disease [place]    takes Sinemet daily   Peripheral neuropathy    Pneumonia 2012   [place]    takes Lamictal daily and Primidone nightly;last seizure 2wks ago  Urinary urgency    With increased frequency   Varicose veins of both lower extremities with pain    With edema.  Takes daily Lasix     Family History  Problem Relation Age of Onset   Allergies Father    Heart disease Father        before 18   Heart failure Father    Hypertension Father    Hyperlipidemia Father    Heart disease Mother    Diabetes Mother    Hypertension Mother    Hyperlipidemia Mother    Hypertension Sister    Heart disease Sister        before 6   Hyperlipidemia Sister    Diabetes Brother    Hypertension Brother    Diabetes Sister    Hypertension Sister    Diabetes Son    Hypertension Son    Cancer Other      Current Outpatient Medications:    acetaminophen (TYLENOL) 500 MG tablet, Take  500-1,000 mg by mouth every 4 (four) hours as needed for moderate pain. , Disp: , Rfl:    albuterol (VENTOLIN HFA) 108 (90 Base) MCG/ACT inhaler, Inhale 1-2 puffs into the lungs every 6 (six) hours as needed for wheezing or shortness of breath. (Patient not taking: Reported on 02/06/2021), Disp: , Rfl:    Alcohol Swabs (B-D SINGLE USE SWABS REGULAR) PADS, USE TWICE DAILY AS DIRECTED, Disp: 200 each, Rfl: 6   Ascorbic Acid (VITAMIN C) 1000 MG tablet, Take 1,000 mg by mouth daily., Disp: , Rfl:    atorvastatin (LIPITOR) 10 MG tablet, TAKE 1 TABLET EVERY EVENING., Disp: 90 tablet, Rfl: 2   B-D ULTRAFINE III SHORT PEN 31G X 8 MM MISC, USE DAILY WITH VICTOZA AND/OR NOVOLOG., Disp: 400 each, Rfl: 1   Blood Glucose Calibration (TRUE METRIX LEVEL 1) Low SOLN, Use as directed  Dx:e11.22, Disp: 1 each, Rfl: 2   Blood Glucose Monitoring Suppl (TRUE METRIX METER) w/Device KIT, Use as directed to check blood sugars 2 times per day dx: e11.22, Disp: 1 kit, Rfl: 1   carvedilol (COREG) 25 MG tablet, TAKE 1 TABLET TWICE DAILY WITH A MEAL, Disp: 180 tablet, Rfl: 2   dexlansoprazole (DEXILANT) 60 MG capsule, Take 1 capsule (60 mg total) by mouth daily., Disp: 90 capsule, Rfl: 2   diclofenac sodium (VOLTAREN) 1 % GEL, Apply 2 g topically 4 (four) times daily as needed (pain)., Disp: , Rfl:    ferrous sulfate 324 MG TBEC, Take 324 mg by mouth 2 (two) times daily., Disp: , Rfl:    fluticasone furoate-vilanterol (BREO ELLIPTA) 100-25 MCG/INH AEPB, Inhale 1 puff into the lungs daily., Disp: 90 each, Rfl: 0   furosemide (LASIX) 80 MG tablet, TAKE 1 TABLET DAILY ON MONDAY THROUGH FRIDAY, AND 1/2 TABLET ON SATURDAY AND SUNDAY, Disp: 78 tablet, Rfl: 3   glucose blood (TRUE METRIX BLOOD GLUCOSE TEST) test strip, Use as directed to check blood sugars 2 times per day dx: e11.22, Disp: 300 each, Rfl: 2   guaiFENesin-dextromethorphan (ROBITUSSIN DM) 100-10 MG/5ML syrup, Take 10 mLs by mouth every 6 (six) hours as needed for cough.  (Patient not taking: Reported on 02/06/2021), Disp: 118 mL, Rfl: 0   hydrALAZINE (APRESOLINE) 100 MG tablet, Take 1 tablet (100 mg total) by mouth 3 (three) times daily., Disp: 270 tablet, Rfl: 3   hydrocortisone cream 1 %, Apply 1 application topically daily as needed for itching. , Disp: , Rfl:    insulin lispro (HUMALOG KWIKPEN)  100 UNIT/ML KwikPen, Inject 0.02-0.04 mLs (2-4 Units total) into the skin 3 (three) times daily as needed (high blood sugar over 150)., Disp: 15 mL, Rfl: 2   Isopropyl Alcohol (ALCOHOL WIPES) 70 % MISC, Apply 1 each topically 2 (two) times daily., Disp: 300 each, Rfl: 3   isosorbide mononitrate (IMDUR) 30 MG 24 hr tablet, TAKE 1 TABLET EVERY DAY, Disp: 60 tablet, Rfl: 0   lamoTRIgine (LAMICTAL) 150 MG tablet, Take 1 tablet (150 mg total) by mouth 2 (two) times daily., Disp: 180 tablet, Rfl: 3   levETIRAcetam (KEPPRA XR) 500 MG 24 hr tablet, Take 3 tablets every night, Disp: 270 tablet, Rfl: 3   OZEMPIC, 1 MG/DOSE, 4 MG/3ML SOPN, INJECT 1 MG INTO THE SKIN ONCE A WEEK., Disp: 9 mL, Rfl: 3   Pancrelipase, Lip-Prot-Amyl, 25000-79000 units CPEP, Take 1-2 capsules by mouth See admin instructions. Take 1 capsules in the morning with breakfast, 1 capsule with lunch, and 2 capsules with supper, Disp: , Rfl:    potassium chloride SA (KLOR-CON) 20 MEQ tablet, TAKE 2 TABLETS EVERY DAY BUT WHEN TAKING METOLAZONE TAKE 3 TABLETS DAILY AS DIRECTED (Patient taking differently: Take 40 mEq by mouth daily.), Disp: 204 tablet, Rfl: 2   sennosides-docusate sodium (SENOKOT-S) 8.6-50 MG tablet, Take 1 tablet by mouth daily., Disp: , Rfl:    sertraline (ZOLOFT) 50 MG tablet, TAKE 1 TABLET EVERY DAY, Disp: 90 tablet, Rfl: 2   tiotropium (SPIRIVA HANDIHALER) 18 MCG inhalation capsule, Place 1 capsule (18 mcg total) into inhaler and inhale daily., Disp: 90 capsule, Rfl: 3   tolnaftate (TINACTIN) 1 % cream, Apply 1 application topically daily as needed (foot fungus). , Disp: , Rfl:    TOUJEO SOLOSTAR  300 UNIT/ML Solostar Pen, Inject 25 Units into the skin at bedtime., Disp: , Rfl:    TRUEplus Lancets 33G MISC, Use as directed to check blood sugars 2 times per day dx: e11.22, Disp: 300 each, Rfl: 2   amLODipine (NORVASC) 5 MG tablet, Take 1 tablet (5 mg total) by mouth daily., Disp: 30 tablet, Rfl: 11   benzonatate (TESSALON PERLES) 100 MG capsule, Take 1 capsule (100 mg total) by mouth 3 (three) times daily as needed for cough., Disp: 30 capsule, Rfl: 1   ondansetron (ZOFRAN) 4 MG tablet, Take 4 mg by mouth every 8 (eight) hours as needed for nausea or vomiting., Disp: , Rfl:    Allergies  Allergen Reactions   Other Anaphylaxis and Rash    Bleach   Penicillins Hives    Did it involve swelling of the face/tongue/throat, SOB, or low BP? No Did it involve sudden or severe rash/hives, skin peeling, or any reaction on the inside of your mouth or nose? Yes Did you need to seek medical attention at a hospital or doctor's office? Yes When did it last happen?      Childhood allergy  If all above answers are "NO", may proceed with cephalosporin use.     Sulfa Antibiotics Hives   Aspirin Other (See Comments)     On aspirin 81 mg - Rectal bleeding in dec 2018   Codeine     Headache and makes the patient feel "off"     Review of Systems  Constitutional: Negative.   Respiratory: Negative.    Cardiovascular: Negative.   Gastrointestinal: Negative.   Psychiatric/Behavioral: Negative.    All other systems reviewed and are negative.   Today's Vitals   01/09/21 1451  BP: 124/72  Pulse: 68  Temp:  98.3 F (36.8 C)  TempSrc: Oral  Weight: 195 lb 6.4 oz (88.6 kg)  Height: 5' 1"  (1.549 m)  PainSc: 0-No pain   Body mass index is 36.92 kg/m.  Wt Readings from Last 3 Encounters:  01/11/21 199 lb 3.2 oz (90.4 kg)  01/09/21 195 lb 6.4 oz (88.6 kg)  12/23/20 197 lb 1.5 oz (89.4 kg)    BP Readings from Last 3 Encounters:  01/11/21 (!) 150/80  01/09/21 124/72  12/24/20 111/63     Objective:  Physical Exam Vitals and nursing note reviewed.  Constitutional:      Appearance: Normal appearance. She is obese.  HENT:     Head: Normocephalic and atraumatic.     Nose:     Comments: Masked     Mouth/Throat:     Comments: Masked  Cardiovascular:     Rate and Rhythm: Normal rate and regular rhythm.     Heart sounds: Normal heart sounds.  Pulmonary:     Effort: Pulmonary effort is normal.     Breath sounds: Normal breath sounds.  Musculoskeletal:     Cervical back: Normal range of motion.     Right lower leg: Edema present.     Left lower leg: Edema present.     Comments: Trace b/l edema  Skin:    General: Skin is warm.  Neurological:     General: No focal deficit present.     Mental Status: She is alert.  Psychiatric:        Mood and Affect: Mood normal.        Behavior: Behavior normal.        Assessment And Plan:     1. Chest discomfort Comments: Resolved. ER records reviewed in full detail.   2. Hypertensive heart disease with chronic diastolic congestive heart failure ([place]) Comments: Chronic, well controlled. She will continue with current meds. NO need for med changes. Advised to follow low sodium diet.   3. Recurrent depression ([place]) Comments: Chronic, she will c/w current meds.   4. Type 2 diabetes mellitus with stage 2 chronic kidney disease, with long-term current use of insulin ([place]) Comments: I will check labs as listed below. I will consider Farxiga at her next visit.  - BMP8+EGFR - Hemoglobin A1c   Patient was given opportunity to ask questions. Patient verbalized understanding of the plan and was able to repeat key elements of the plan. All questions were answered to their satisfaction.   I, [name], MD, have reviewed all documentation for this visit. The documentation on 02/12/21 for the exam, diagnosis, procedures, and orders are all accurate and complete.   IF YOU HAVE BEEN REFERRED TO A SPECIALIST, IT MAY TAKE 1-2 WEEKS TO  SCHEDULE/PROCESS THE REFERRAL. IF YOU HAVE NOT HEARD FROM US/SPECIALIST IN TWO WEEKS, PLEASE GIVE [place] A CALL AT [phone] X [phone].   THE PATIENT IS ENCOURAGED TO PRACTICE SOCIAL DISTANCING DUE TO THE COVID-19 PANDEMIC.

## 2021-01-10 LAB — HEMOGLOBIN A1C
Est. average glucose Bld gHb Est-mCnc: 194 mg/dL
Hgb A1c MFr Bld: 8.4 % — ABNORMAL HIGH (ref 4.8–5.6)

## 2021-01-10 LAB — BMP8+EGFR
BUN/Creatinine Ratio: 13 (ref 12–28)
BUN: 11 mg/dL (ref 8–27)
CO2: 28 mmol/L (ref 20–29)
Calcium: 9.2 mg/dL (ref 8.7–10.3)
Chloride: 96 mmol/L (ref 96–106)
Creatinine, Ser: 0.85 mg/dL (ref 0.57–1.00)
Glucose: 94 mg/dL (ref 65–99)
Potassium: 4.1 mmol/L (ref 3.5–5.2)
Sodium: 135 mmol/L (ref 134–144)
eGFR: 73 mL/min/{1.73_m2} (ref >59)

## 2021-01-11 ENCOUNTER — Encounter (HOSPITAL_COMMUNITY): Payer: Self-pay | Admitting: Cardiology

## 2021-01-11 ENCOUNTER — Other Ambulatory Visit: Payer: Self-pay

## 2021-01-11 ENCOUNTER — Ambulatory Visit: Payer: Medicare HMO | Admitting: Internal Medicine

## 2021-01-11 ENCOUNTER — Ambulatory Visit (HOSPITAL_COMMUNITY)
Admission: RE | Admit: 2021-01-11 | Discharge: 2021-01-11 | Disposition: A | Discharge status: Home / Self Care | Payer: Medicare HMO | Source: Ambulatory Visit | Attending: Cardiology | Admitting: Cardiology

## 2021-01-11 VITALS — BP 150/80 | HR 66 | Wt 199.2 lb

## 2021-01-11 DIAGNOSIS — Z794 Long term (current) use of insulin: Secondary | ICD-10-CM | POA: Diagnosis not present

## 2021-01-11 DIAGNOSIS — I5032 Chronic diastolic (congestive) heart failure: Secondary | ICD-10-CM | POA: Insufficient documentation

## 2021-01-11 DIAGNOSIS — Z8616 Personal history of COVID-19: Secondary | ICD-10-CM | POA: Insufficient documentation

## 2021-01-11 DIAGNOSIS — I110 Hypertensive heart disease with heart failure: Secondary | ICD-10-CM | POA: Insufficient documentation

## 2021-01-11 DIAGNOSIS — G4733 Obstructive sleep apnea (adult) (pediatric): Secondary | ICD-10-CM | POA: Insufficient documentation

## 2021-01-11 DIAGNOSIS — Z87891 Personal history of nicotine dependence: Secondary | ICD-10-CM | POA: Insufficient documentation

## 2021-01-11 DIAGNOSIS — E662 Morbid (severe) obesity with alveolar hypoventilation: Secondary | ICD-10-CM | POA: Diagnosis not present

## 2021-01-11 DIAGNOSIS — Z79899 Other long term (current) drug therapy: Secondary | ICD-10-CM | POA: Insufficient documentation

## 2021-01-11 DIAGNOSIS — I272 Pulmonary hypertension, unspecified: Secondary | ICD-10-CM | POA: Insufficient documentation

## 2021-01-11 MED ORDER — AMLODIPINE BESYLATE 5 MG PO TABS: 5.0000 mg | ORAL_TABLET | Freq: Every day | ORAL | 11 refills | Status: DC

## 2021-01-11 NOTE — Patient Instructions (Addendum)
No Labs done today.   START Amlodipine 5mg  (1 tablet) by mouth daily.  No other medication changes were made. Please continue all current medications as prescribed.  Please make sure you use your CPAP machine nightly.  Your physician recommends that you schedule a follow-up appointment in: 6 months with our [facility] here in our office. Please contact our office in December to schedule a January appointment.   If you have any questions or concerns before your next appointment please send [place] a message through [place] or call our office at [phone].    TO LEAVE A MESSAGE FOR THE NURSE SELECT OPTION 2, PLEASE LEAVE A MESSAGE INCLUDING: YOUR NAME DATE OF BIRTH CALL BACK NUMBER REASON FOR CALL**this is important as we prioritize the call backs  YOU WILL RECEIVE A CALL BACK THE SAME DAY AS LONG AS YOU CALL BEFORE 4:00 PM   Do the following things EVERYDAY: Weigh yourself in the morning before breakfast. Write it down and keep it in a log. Take your medicines as prescribed Eat low salt foods--Limit salt (sodium) to 2000 mg per day.  Stay as active as you can everyday Limit all fluids for the day to less than 2 liters   At the [facility], you and your health needs are our priority. As part of our continuing mission to provide you with exceptional heart care, we have created designated Provider Care Teams. These Care Teams include your primary Cardiologist (physician) and Advanced Practice Providers (APPs- Physician Assistants and Nurse Practitioners) who all work together to provide you with the care you need, when you need it.   You may see any of the following providers on your designated Care Team at your next follow up: Dr Glori Bickers Dr Haynes Kerns, NP Lyda Jester, [place] Audry Riles, PharmD   Please be sure to bring in all your medications bottles to every appointment.

## 2021-01-12 NOTE — Progress Notes (Signed)
PCP: [name], MD Cardiology: Dr. Ellyn Hack HF Cardiology: Dr. [name]  75 y.o. with history of OHS/OSA and RV dysfunction was referred by Dr. Ellyn Hack for evaluation of CHF.  She has a history of OSA/OHS.  She uses CPAP at night but not currently using oxygen during the day.  Remote smoker.  She had an echo in 9/20 showing EF 50-55% but moderate-severe RV dysfunction with mild-moderate RV dilation.  RHC/LHC in 10/20 showed normal coronaries, pulmonary venous hypertension.  V/Q scan in 2/21 did not show evidence for acute on chronic PE.    Cardiomems was placed in 5/21.  RHC at that time showed normal filling pressures and moderate pulmonary hypertension but suspect due to high output.  No evidence for shunt lesion.  Echo showed EF 60-65%, enlarged RV with normal systolic function.   She was admitted in 5/21 with syncope, thought to be due to a seizure.    She had COVID-19 infection in 8/21. 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   Echo in 9/21 with EF 50-55%, severe RV enlargement and severely decreased RV systolic function, PASP 54 mmHg.    She was admitted in 5/22 with CHF, bronchitis.  She was diuresed and treated with antibiotics. Echo in 5/22 showed EF 60-65%, mild LVH, ?normal RV, severe LAE, ?normal PA pressure, IVC normal.  V/Q scan negative.   She returns for followup of CHF.  Weight is down 6 lbs.  She had a single episode of chest pain about 2 wks ago, no recurrence.  It was not exertional.  She was seen in the ER with negative troponin.  She is chronically short of breath after walking 50-100 yards.  No orthopnea/PND (uses CPAP).  No lightheadedness or syncope.  BP is elevated.   Labs (10/20): K 4.1, creatinine 1.15, hgb 11.4 Labs (6/21): K 3.5, creatinine 1.04, BNP 81 Labs (12/21): K 3.7, creatinine 0.79  Cardiomems 20 mmHg (goal 23 mmHg)  ECG (7/22, personally reviewed):  NSR, iRBBB  PMH: 1. OHS/OSA: She uses CPAP.  2. H/o CVA 3. Seizure disorder 4. Type 2 DM 5. Depression 6. HTN 7. Hyperlipidemia 8. Chronic diastolic CHF: Echo (2/83) with EF 50-55%, moderate to severe RV dilation with mild-moderately decreased RV systolic function.  - RHC/LHC (10/20): normal coronaries; mean RA 10, PA 56/14 mean 31, mean PCWP 13, LVEDP 18, CO/CI 7.35/3.73, PVR 2.44.  - RHC/Cardiomems placement (5/21): mean RA 7, PA 58/21 mean 34, PCWP mean 12, CI 8.3 F/3.95 T, PVR 2.86 WU. No evidence for shunt lesion.  - Echo (5/21): EF 60-65%, RV enlarged with normal systolic function.  - Echo (9/21): EF 50-55%, severe RV enlargement and severely decreased RV systolic function, PASP 54 mmHg.  - Echo (5/22): EF 60-65%, mild LVH, ?normal RV, severe LAE, ?normal PA pressure, IVC normal. 9. Primarily pulmonary venous hypertension.  - V/Q scan (2/21, 5/22): No evidence for acute or chronic PE.  - PFTs normal in 5/21.  10. COVID-19 PNA 8/21.  - CT chest in 10/21 with emphysema and mild fibrosis concerning for post-infectious inflammatory fibrosis from COVID.   Social History   Socioeconomic History   Marital status: Divorced    Spouse name: Not on file   Number of children: Not on file   Years of education: Not on file   Highest education level: Not on file  Occupational History   Occupation: Disabled    Comment: CNA  Tobacco  Use   Smoking status: Former    Packs/day: 0.50    Years: 25.00    Pack years: 12.50    Types: Cigarettes   Smokeless tobacco: Never   Tobacco comments:    quit smoking 20+ytrs ago  Vaping Use   Vaping Use: Never used  Substance and Sexual Activity   Alcohol use: No   Drug use: No   Sexual activity: Not Currently    Birth control/protection: Surgical  Other Topics Concern   Not on file  Social History Narrative   She lives in [place]. She has lots of family in the area and is accompanied by her sister.   She is a retired [employer] manager.  Disabled  secondary to recurrent seizure activity.   She has a distant history of smoking, quit 20 years ago.   Social Determinants of Health   Financial Resource Strain: Low Risk    Difficulty of Paying Living Expenses: Not hard at all  Food Insecurity: No Food Insecurity   Worried About Charity fundraiser in the Last Year: Never true   [place] in the Last Year: Never true  Transportation Needs: No Transportation Needs   Lack of Transportation (Medical): No   Lack of Transportation (Non-Medical): No  Physical Activity: Inactive   Days of Exercise per Week: 0 days   Minutes of Exercise per Session: 0 min  Stress: No Stress Concern Present   Feeling of Stress : Not at all  Social Connections: Not on file  Intimate Partner Violence: Not on file   Family History  Problem Relation Age of Onset   Allergies Father    Heart disease Father        before 27   Heart failure Father    Hypertension Father    Hyperlipidemia Father    Heart disease Mother    Diabetes Mother    Hypertension Mother    Hyperlipidemia Mother    Hypertension Sister    Heart disease Sister        before 56   Hyperlipidemia Sister    Diabetes Brother    Hypertension Brother    Diabetes Sister    Hypertension Sister    Diabetes Son    Hypertension Son    Cancer Other    Current Outpatient Medications  Medication Sig Dispense Refill   acetaminophen (TYLENOL) 500 MG tablet Take 500-1,000 mg by mouth every 4 (four) hours as needed for moderate pain.      albuterol (VENTOLIN HFA) 108 (90 Base) MCG/ACT inhaler Inhale 1-2 puffs into the lungs every 6 (six) hours as needed for wheezing or shortness of breath.     Alcohol Swabs (B-D SINGLE USE SWABS REGULAR) PADS USE TWICE DAILY AS DIRECTED 200 each 6   amLODipine (NORVASC) 5 MG tablet Take 1 tablet (5 mg total) by mouth daily. 30 tablet 11   Ascorbic Acid (VITAMIN C) 1000 MG tablet Take 1,000 mg by mouth daily.     B-D ULTRAFINE III SHORT PEN 31G X 8 MM MISC  USE DAILY WITH VICTOZA AND/OR NOVOLOG. 400 each 1   benzonatate (TESSALON PERLES) 100 MG capsule Take 1 capsule (100 mg total) by mouth 3 (three) times daily as needed for cough. 30 capsule 1   Blood Glucose Calibration (TRUE METRIX LEVEL 1) Low SOLN Use as directed  Dx:e11.22 1 each 2   Blood Glucose Monitoring Suppl (TRUE METRIX METER) w/Device KIT Use as directed to check blood sugars 2 times per day  dx: e11.22 1 kit 1   carvedilol (COREG) 25 MG tablet TAKE 1 TABLET TWICE DAILY WITH A MEAL 180 tablet 2   dexlansoprazole (DEXILANT) 60 MG capsule Take 1 capsule (60 mg total) by mouth daily. 90 capsule 2   diclofenac sodium (VOLTAREN) 1 % GEL Apply 2 g topically 4 (four) times daily as needed (pain).     ferrous sulfate 324 MG TBEC Take 324 mg by mouth 2 (two) times daily.     fluticasone furoate-vilanterol (BREO ELLIPTA) 100-25 MCG/INH AEPB Inhale 1 puff into the lungs daily. 90 each 0   furosemide (LASIX) 80 MG tablet TAKE 1 TABLET DAILY ON MONDAY THROUGH FRIDAY, AND 1/2 TABLET ON SATURDAY AND SUNDAY 78 tablet 3   glucose blood (TRUE METRIX BLOOD GLUCOSE TEST) test strip Use as directed to check blood sugars 2 times per day dx: e11.22 300 each 2   guaiFENesin-dextromethorphan (ROBITUSSIN DM) 100-10 MG/5ML syrup Take 10 mLs by mouth every 6 (six) hours as needed for cough. 118 mL 0   hydrALAZINE (APRESOLINE) 100 MG tablet Take 1 tablet (100 mg total) by mouth 3 (three) times daily. 270 tablet 3   hydrocortisone cream 1 % Apply 1 application topically daily as needed for itching.      insulin lispro (HUMALOG KWIKPEN) 100 UNIT/ML KwikPen Inject 0.02-0.04 mLs (2-4 Units total) into the skin 3 (three) times daily as needed (high blood sugar over 150). 15 mL 2   Isopropyl Alcohol (ALCOHOL WIPES) 70 % MISC Apply 1 each topically 2 (two) times daily. 300 each 3   isosorbide mononitrate (IMDUR) 30 MG 24 hr tablet TAKE 1 TABLET EVERY DAY 60 tablet 0   lamoTRIgine (LAMICTAL) 150 MG tablet Take 1 tablet (150  mg total) by mouth 2 (two) times daily. 180 tablet 3   levETIRAcetam (KEPPRA XR) 500 MG 24 hr tablet Take 3 tablets every night 270 tablet 3   ondansetron (ZOFRAN) 4 MG tablet Take 4 mg by mouth every 8 (eight) hours as needed for nausea or vomiting.     OZEMPIC, 1 MG/DOSE, 4 MG/3ML SOPN INJECT 1 MG INTO THE SKIN ONCE A WEEK. 9 mL 3   Pancrelipase, Lip-Prot-Amyl, 25000-79000 units CPEP Take 1-2 capsules by mouth See admin instructions. Take 1 capsules in the morning with breakfast, 1 capsule with lunch, and 2 capsules with supper     potassium chloride SA (KLOR-CON) 20 MEQ tablet TAKE 2 TABLETS EVERY DAY BUT WHEN TAKING METOLAZONE TAKE 3 TABLETS DAILY AS DIRECTED (Patient taking differently: Take 40 mEq by mouth daily.) 204 tablet 2   sennosides-docusate sodium (SENOKOT-S) 8.6-50 MG tablet Take 1 tablet by mouth daily.     sertraline (ZOLOFT) 50 MG tablet TAKE 1 TABLET EVERY DAY 90 tablet 2   tiotropium (SPIRIVA HANDIHALER) 18 MCG inhalation capsule Place 1 capsule (18 mcg total) into inhaler and inhale daily. 90 capsule 3   tolnaftate (TINACTIN) 1 % cream Apply 1 application topically daily as needed (foot fungus).      TOUJEO SOLOSTAR 300 UNIT/ML Solostar Pen Inject 25 Units into the skin at bedtime.     TRUEplus Lancets 33G MISC Use as directed to check blood sugars 2 times per day dx: e11.22 300 each 2   atorvastatin (LIPITOR) 10 MG tablet TAKE 1 TABLET EVERY EVENING. 90 tablet 2   No current facility-administered medications for this encounter.   BP (!) 150/80   Pulse 66   Wt 90.4 kg (199 lb 3.2 oz)   SpO2 95%  BMI 37.64 kg/m  General: NAD Neck: No JVD, no thyromegaly or thyroid nodule.  Lungs: Clear to auscultation bilaterally with normal respiratory effort. CV: Nondisplaced PMI.  Heart regular S1/S2, no S3/S4, no murmur.  No peripheral edema.  No carotid bruit.  Normal pedal pulses.  Abdomen: Soft, nontender, no hepatosplenomegaly, no distention.  Skin: Intact without lesions or  rashes.  Neurologic: Alert and oriented x 3.  Psych: Normal affect. Extremities: No clubbing or cyanosis.  HEENT: Normal.   Assessment/Plan: 1. Chronic diastolic CHF/RV failure: Echo in 9/20 showed EF 50-55% with moderate-severe RV dilation and mild-moderately decreased systolic function. RHC/LHC showed mild-moderate pulmonary venous hypertension and no significant coronary disease.  Cause of RV dysfunction is not clear, possibly due to diastolic CHF + OHS/OSA.  V/Q scan in 2/21 did not show acute or chronic PE.  Cardiomems was placed in 5/21, normal filling pressures with moderate pulmonary hypertension likely due to high output (no evidence for shunt lesion on RHC).  Echo in 9/21 with EF 50-55%, severe RVE with severe RV dysfunction.  Echo in 5/22 with EF 60-65%, RV function reportedly normal with normal PA pressure.  She is not volume overloaded today by exam or Cardiomems and weight is down.  However, she has chronic NYHA class III symptoms.  I suspect that her dyspnea is primarily pulmonary, based on last CT, she has developed mild pulmonary fibrosis since COVID, possibly post-inflammatory.   - Continue Lasix 80 mg daily.  - She did not tolerate SGLT2-inhibitor due to recurrent yeast infections.    2. OHS/OSA: She is using CPAP but not oxygen during the day (no longer appears to need). CT chest in 10/21 with emphysema and mild fibrosis, possibly post-inflammatory after COVID-19 infection.   - Followup with pulmonary (Dr. Vaughan Browner).  3. Pulmonary hypertension: RHC in 10/20 showed mild-moderate pulmonary venous hypertension.  RHC in 5/21 showed moderate pulmonary hypertension with low PVR and high CO, likely PH due to high output, no evidence for shunt lesion. No role for selective pulmonary vasodilators.  4. HTN: Add amlodipine 5 mg daily.   Followup in 6 months with APP.   Loralie Champagne 01/12/2021

## 2021-01-16 ENCOUNTER — Telehealth: Payer: Medicare HMO

## 2021-01-23 ENCOUNTER — Telehealth: Payer: Self-pay

## 2021-01-23 NOTE — Chronic Care Management (AMB) (Signed)
  Called patient with telephone appointment reminder with [name] CPP on 01-24-2021 at 2:00. After completing questions patient's sister reminded patient that she has a funeral to attend. Rescheduled patient to 02-21-2021.   Questions: Have you had any recent office visit or specialist visit outside of [facility]? Patient stated no  Are there any concerns you would like to discuss during your office visit? Patient stated no  Are you having any problems obtaining your medications? (Whether it pharmacy issues or cost) Patient stated No  If patient has any PAP medications ask if they are having any problems getting their PAP medication or refill? None  Care Gaps: Shingrix overdue Yearly foot exam overdue RAF= 5.404%  Star Rating Drug: Atorvastatin 10 mg- Last filled 09-20-2020 90 DS [employer] Ozempic 1 mg- Last filled 10-28-2020 84 DS [employer]  Any gaps in medications fill history? Yes   [place] Pharmacist Assistant [phone]

## 2021-01-24 ENCOUNTER — Telehealth: Payer: Medicare HMO

## 2021-01-25 ENCOUNTER — Encounter (INDEPENDENT_AMBULATORY_CARE_PROVIDER_SITE_OTHER): Payer: Medicare HMO | Admitting: Ophthalmology

## 2021-01-25 DIAGNOSIS — H3321 Serous retinal detachment, right eye: Secondary | ICD-10-CM

## 2021-01-25 DIAGNOSIS — H3581 Retinal edema: Secondary | ICD-10-CM

## 2021-01-25 DIAGNOSIS — H33323 Round hole, bilateral: Secondary | ICD-10-CM

## 2021-01-25 DIAGNOSIS — Z961 Presence of intraocular lens: Secondary | ICD-10-CM

## 2021-01-25 DIAGNOSIS — H35413 Lattice degeneration of retina, bilateral: Secondary | ICD-10-CM

## 2021-01-25 DIAGNOSIS — H33101 Unspecified retinoschisis, right eye: Secondary | ICD-10-CM

## 2021-01-25 DIAGNOSIS — H35033 Hypertensive retinopathy, bilateral: Secondary | ICD-10-CM

## 2021-01-25 DIAGNOSIS — E119 Type 2 diabetes mellitus without complications: Secondary | ICD-10-CM

## 2021-01-25 DIAGNOSIS — I10 Essential (primary) hypertension: Secondary | ICD-10-CM

## 2021-01-27 ENCOUNTER — Ambulatory Visit: Payer: Medicare HMO | Admitting: Neurology

## 2021-01-30 IMAGING — CT CT ABDOMEN AND PELVIS WITH CONTRAST
2 of 5 series · 17 of 46 positions shown, 19 images · IV contrast (iopamidol)
Comparison: 10/31/2012

CLINICAL DATA: Umbilical hernia

EXAM:
CT ABDOMEN AND PELVIS WITH CONTRAST
TECHNIQUE: Multidetector CT imaging of the abdomen and pelvis was performed
using the standard protocol following bolus administration of
intravenous contrast.
CONTRAST:  125mL AHP18I-ACC IOPAMIDOL (AHP18I-ACC) INJECTION 61%

[Series 2: abd pelvis 5.00 br40 s3 axial · axial · 0.84mm/px · z∈[+1144,+1559]mm · 14 of 93 slices shown, 16 images]
[im 5/93  soft-tissue]
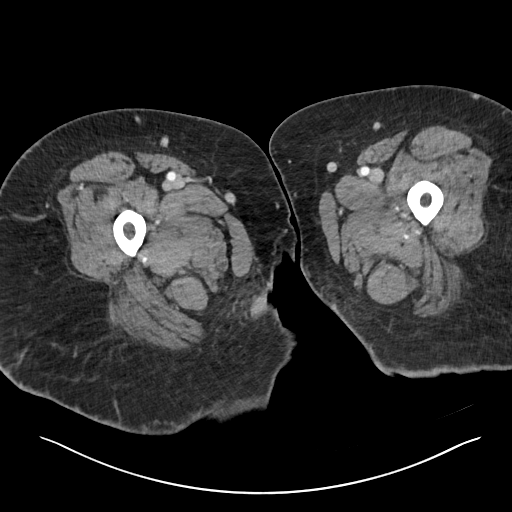
[im 5/93  bone]
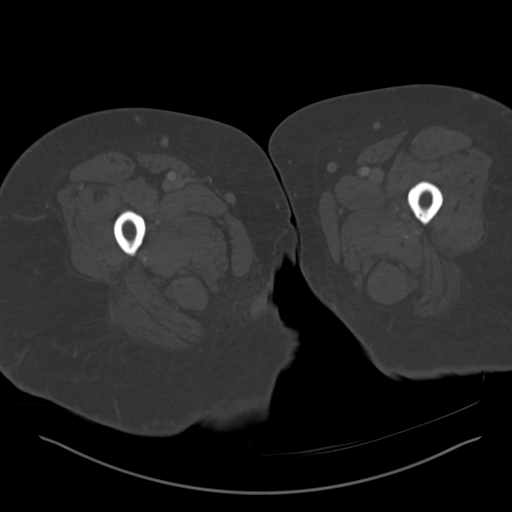
[im 14/93  soft-tissue]
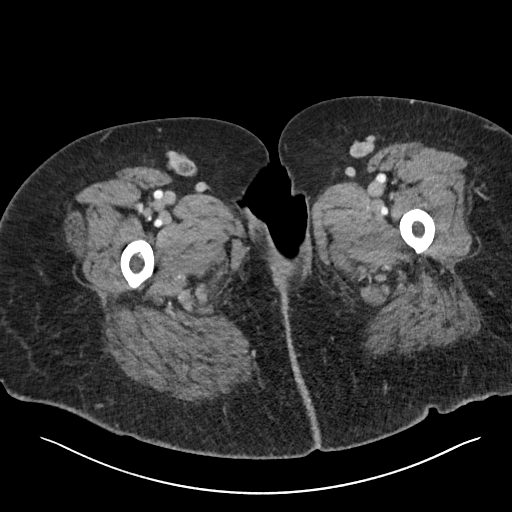
[im 19/93  soft-tissue]
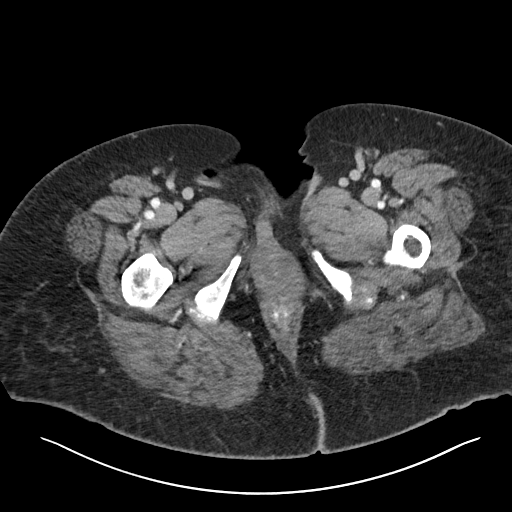
[im 24/93  soft-tissue]
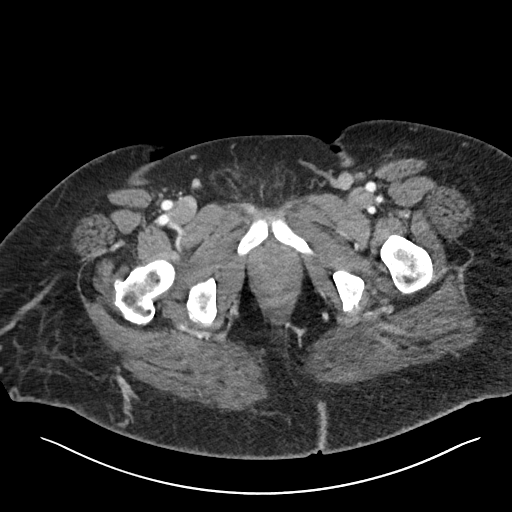
[im 33/93  soft-tissue]
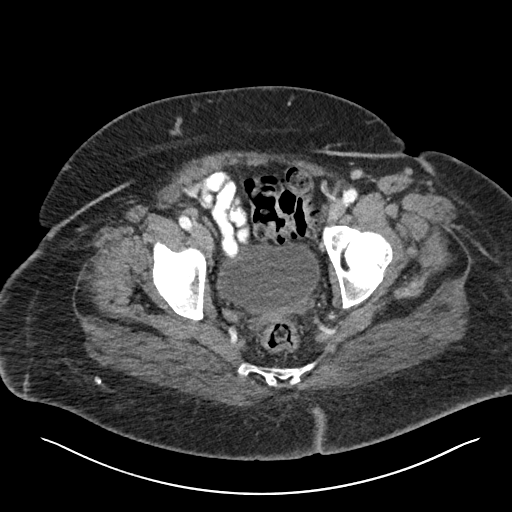
[im 37/93  soft-tissue]
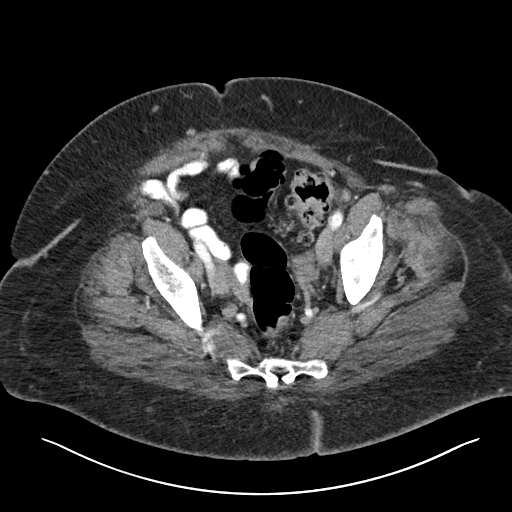
[im 42/93  soft-tissue]
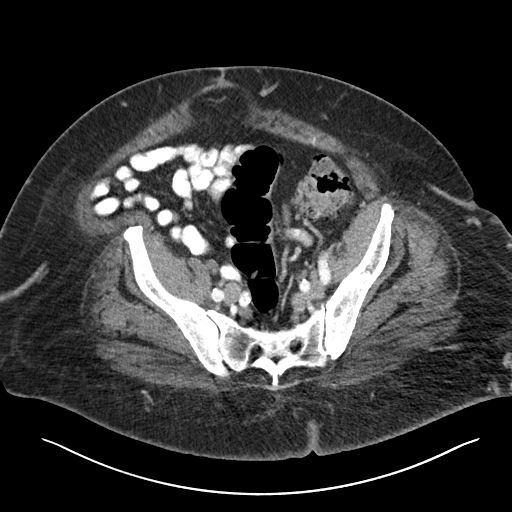
[im 51/93  soft-tissue]
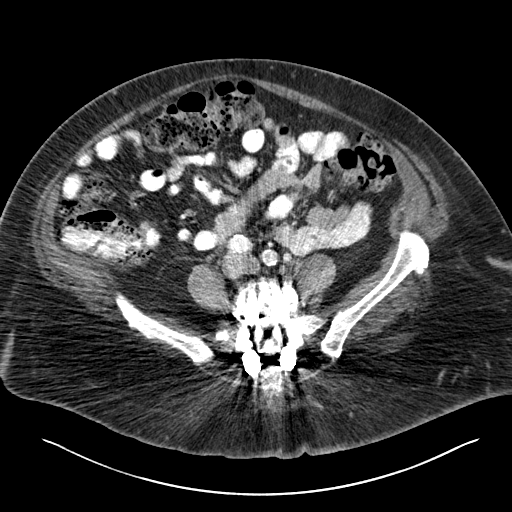
[im 56/93  soft-tissue]
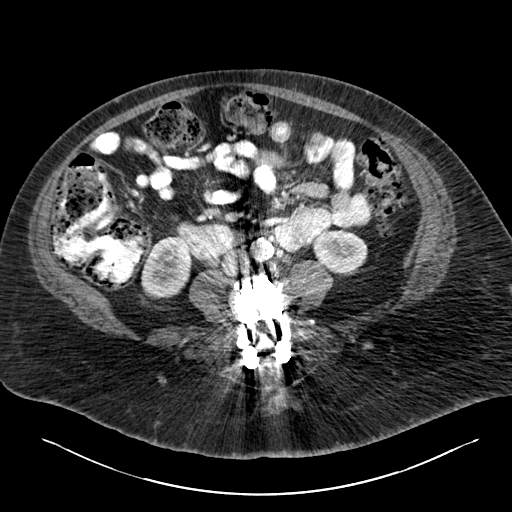
[im 56/93  bone]
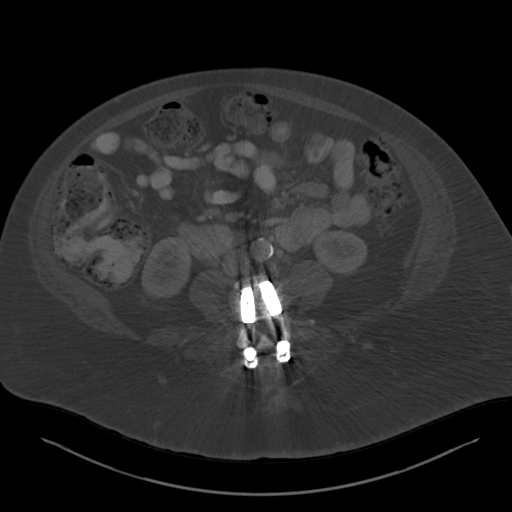
[im 60/93  soft-tissue]
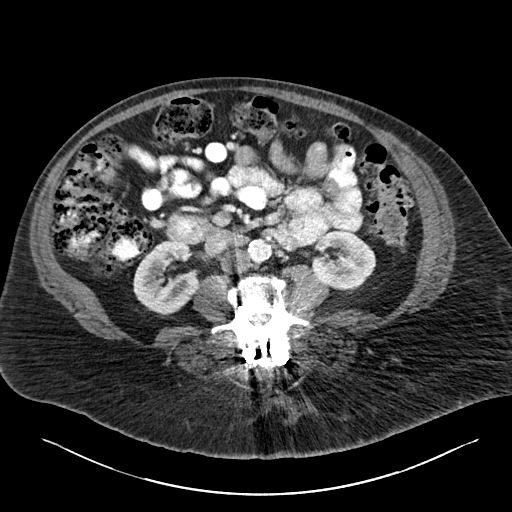
[im 70/93  soft-tissue]
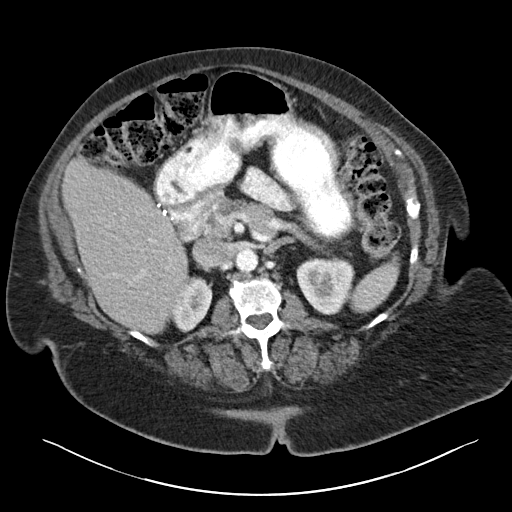
[im 74/93  soft-tissue]
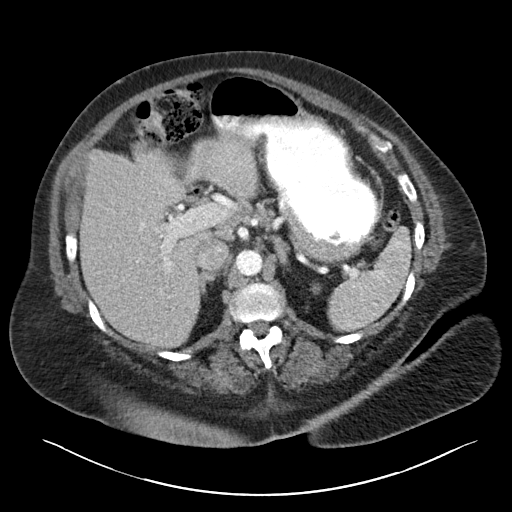
[im 79/93  soft-tissue]
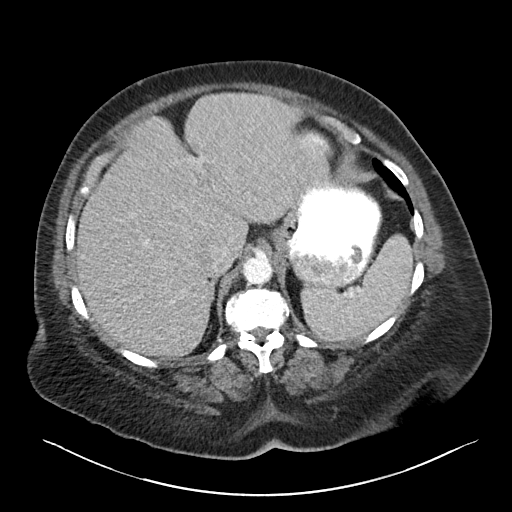
[im 88/93  soft-tissue]
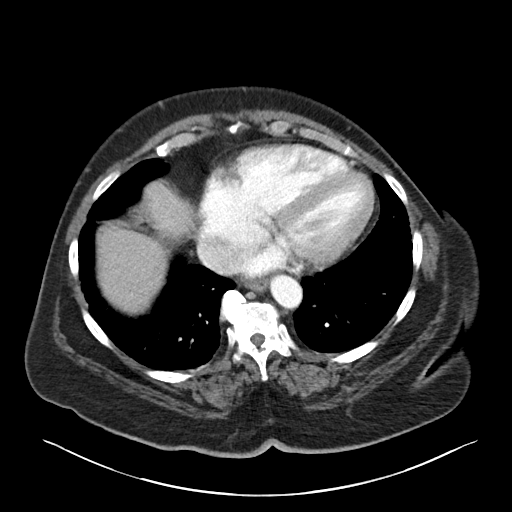

[Series 6: abd pelvis 2.00 br40 s3 cor · coronal · 0.84mm/px · 3 of 216 slices shown]
[im 72/216  soft-tissue]
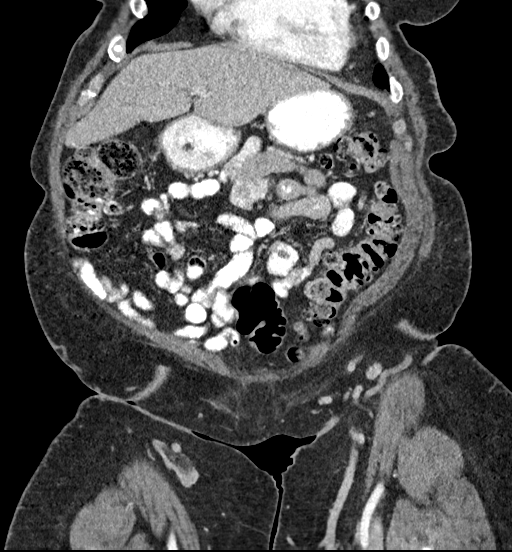
[im 96/216  soft-tissue]
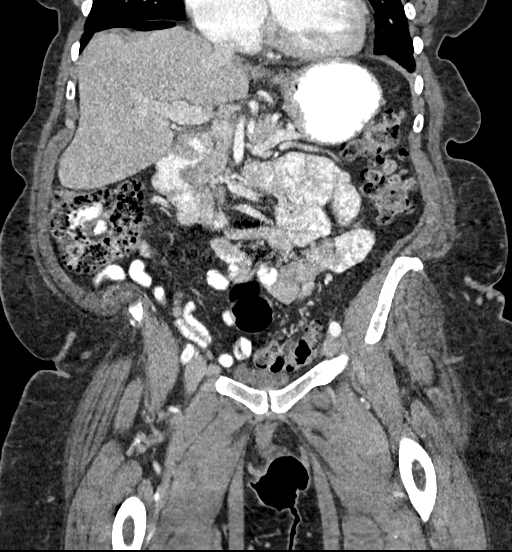
[im 120/216  soft-tissue]
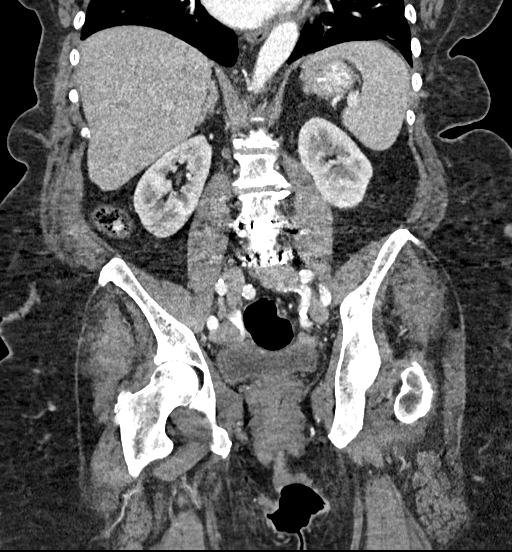

[17 of 46 positions shown; findings below may reference images not displayed]

FINDINGS: Lower chest: Cardiomegaly.  Lung bases clear.  No effusions.

Hepatobiliary: Prior cholecystectomy.  No focal hepatic abnormality.

Pancreas: Atrophic tail.  No focal mass or ductal dilatation.

Spleen: No focal abnormality.  Normal size.

Adrenals/Urinary Tract: No adrenal abnormality. No focal renal
abnormality. No stones or hydronephrosis. Urinary bladder is
unremarkable.

Stomach/Bowel: Large stool burden throughout the colon. Diffuse
colonic diverticulosis. No active diverticulitis. Stomach and small
bowel decompressed.

Vascular/Lymphatic: No evidence of aneurysm or adenopathy.

Reproductive: Prior hysterectomy.  No adnexal masses.

Other: No free fluid or free air.

Musculoskeletal: No acute bony abnormality. Postoperative changes in
the lumbar spine.
IMPRESSION: Cardiomegaly.

Colonic diverticulosis. No active diverticulitis. Large stool
burden.

No acute findings in the abdomen or pelvis.

## 2021-01-31 ENCOUNTER — Telehealth: Payer: Medicare HMO

## 2021-01-31 ENCOUNTER — Telehealth: Payer: Self-pay

## 2021-01-31 NOTE — Telephone Encounter (Signed)
  Care Management   Follow Up Note   01/31/2021 Name: [name] MRN: [id number] DOB: January 07, 1946   Referred by: [name], MD Reason for referral : Chronic Care Management (RN CM Follow up call )   An unsuccessful telephone outreach was attempted today. The patient was referred to the case management team for assistance with care management and care coordination.   Follow Up Plan: A HIPPA compliant phone message was left for the patient providing contact information and requesting a return call.   Barb Merino, RN, BSN, CCM Care Management Coordinator [facility]  Direct Phone: [phone]

## 2021-02-02 NOTE — Progress Notes (Signed)
[facility] Note  02/06/2021     CHIEF COMPLAINT Patient presents for Retina Follow Up   HISTORY OF PRESENT ILLNESS: [name] is a 75 y.o. female who presents to the clinic today for:  HPI     Retina Follow Up   Patient presents with  Other.  In both eyes.  Duration of 2 months.  Since onset it is stable.  I, the attending physician,  performed the HPI with the patient and updated documentation appropriately.        Comments   2 1/5 week follow up Lattice degeneration OU- Right after laser OD she had mucous drainage.  She used a warm compress that helped.  Denies have any other problems after the laser OU.  Vision seems about the same as it was last visit.  BS 102 this am A1C increased to 8.4      Last edited by Bernarda Caffey, MD on 02/06/2021  5:23 PM.     Referring physician: Warden Fillers, MD [address] [place],  [place] [address]  HISTORICAL INFORMATION:   Selected notes from the MEDICAL RECORD NUMBER    CURRENT MEDICATIONS: No current outpatient medications on file. (Ophthalmic Drugs)   No current facility-administered medications for this visit. (Ophthalmic Drugs)   Current Outpatient Medications (Other)  Medication Sig   acetaminophen (TYLENOL) 500 MG tablet Take 500-1,000 mg by mouth every 4 (four) hours as needed for moderate pain.    Alcohol Swabs (B-D SINGLE USE SWABS REGULAR) PADS USE TWICE DAILY AS DIRECTED   amLODipine (NORVASC) 5 MG tablet Take 1 tablet (5 mg total) by mouth daily.   Ascorbic Acid (VITAMIN C) 1000 MG tablet Take 1,000 mg by mouth daily.   atorvastatin (LIPITOR) 10 MG tablet TAKE 1 TABLET EVERY EVENING.   benzonatate (TESSALON PERLES) 100 MG capsule Take 1 capsule (100 mg total) by mouth 3 (three) times daily as needed for cough.   carvedilol (COREG) 25 MG tablet TAKE 1 TABLET TWICE DAILY WITH A MEAL   dexlansoprazole (DEXILANT) 60 MG capsule Take 1 capsule (60 mg total) by mouth  daily.   diclofenac sodium (VOLTAREN) 1 % GEL Apply 2 g topically 4 (four) times daily as needed (pain).   ferrous sulfate 324 MG TBEC Take 324 mg by mouth 2 (two) times daily.   fluticasone furoate-vilanterol (BREO ELLIPTA) 100-25 MCG/INH AEPB Inhale 1 puff into the lungs daily.   furosemide (LASIX) 80 MG tablet TAKE 1 TABLET DAILY ON MONDAY THROUGH FRIDAY, AND 1/2 TABLET ON SATURDAY AND SUNDAY   hydrALAZINE (APRESOLINE) 100 MG tablet Take 1 tablet (100 mg total) by mouth 3 (three) times daily.   hydrocortisone cream 1 % Apply 1 application topically daily as needed for itching.    insulin lispro (HUMALOG KWIKPEN) 100 UNIT/ML KwikPen Inject 0.02-0.04 mLs (2-4 Units total) into the skin 3 (three) times daily as needed (high blood sugar over 150).   isosorbide mononitrate (IMDUR) 30 MG 24 hr tablet TAKE 1 TABLET EVERY DAY   lamoTRIgine (LAMICTAL) 150 MG tablet Take 1 tablet (150 mg total) by mouth 2 (two) times daily.   levETIRAcetam (KEPPRA XR) 500 MG 24 hr tablet Take 3 tablets every night   ondansetron (ZOFRAN) 4 MG tablet Take 4 mg by mouth every 8 (eight) hours as needed for nausea or vomiting.   OZEMPIC, 1 MG/DOSE, 4 MG/3ML SOPN INJECT 1 MG INTO THE SKIN ONCE A WEEK.   Pancrelipase, Lip-Prot-Amyl,  25000-79000 units CPEP Take 1-2 capsules by mouth See admin instructions. Take 1 capsules in the morning with breakfast, 1 capsule with lunch, and 2 capsules with supper   potassium chloride SA (KLOR-CON) 20 MEQ tablet TAKE 2 TABLETS EVERY DAY BUT WHEN TAKING METOLAZONE TAKE 3 TABLETS DAILY AS DIRECTED (Patient taking differently: Take 40 mEq by mouth daily.)   sennosides-docusate sodium (SENOKOT-S) 8.6-50 MG tablet Take 1 tablet by mouth daily.   sertraline (ZOLOFT) 50 MG tablet TAKE 1 TABLET EVERY DAY   tiotropium (SPIRIVA HANDIHALER) 18 MCG inhalation capsule Place 1 capsule (18 mcg total) into inhaler and inhale daily.   tolnaftate (TINACTIN) 1 % cream Apply 1 application topically daily as  needed (foot fungus).    TOUJEO SOLOSTAR 300 UNIT/ML Solostar Pen Inject 25 Units into the skin at bedtime.   TRUEplus Lancets 33G MISC Use as directed to check blood sugars 2 times per day dx: e11.22   albuterol (VENTOLIN HFA) 108 (90 Base) MCG/ACT inhaler Inhale 1-2 puffs into the lungs every 6 (six) hours as needed for wheezing or shortness of breath. (Patient not taking: Reported on 02/06/2021)   B-D ULTRAFINE III SHORT PEN 31G X 8 MM MISC USE DAILY WITH VICTOZA AND/OR NOVOLOG.   Blood Glucose Calibration (TRUE METRIX LEVEL 1) Low SOLN Use as directed  Dx:e11.22   Blood Glucose Monitoring Suppl (TRUE METRIX METER) w/Device KIT Use as directed to check blood sugars 2 times per day dx: e11.22   glucose blood (TRUE METRIX BLOOD GLUCOSE TEST) test strip Use as directed to check blood sugars 2 times per day dx: e11.22   guaiFENesin-dextromethorphan (ROBITUSSIN DM) 100-10 MG/5ML syrup Take 10 mLs by mouth every 6 (six) hours as needed for cough. (Patient not taking: Reported on 02/06/2021)   Isopropyl Alcohol (ALCOHOL WIPES) 70 % MISC Apply 1 each topically 2 (two) times daily.   No current facility-administered medications for this visit. (Other)   REVIEW OF SYSTEMS: ROS   Positive for: Gastrointestinal, Neurological, Genitourinary, Musculoskeletal, Endocrine, Cardiovascular, Eyes, Respiratory Negative for: Constitutional, Skin, HENT, Psychiatric, Allergic/Imm, Heme/Lymph Last edited by Leonie Douglas, COA on 02/06/2021  2:10 PM.     ALLERGIES Allergies  Allergen Reactions   Other Anaphylaxis and Rash    Bleach   Penicillins Hives    Did it involve swelling of the face/tongue/throat, SOB, or low BP? No Did it involve sudden or severe rash/hives, skin peeling, or any reaction on the inside of your mouth or nose? Yes Did you need to seek medical attention at a hospital or doctor's office? Yes When did it last happen?      Childhood allergy  If all above answers are "NO", may proceed with  cephalosporin use.     Sulfa Antibiotics Hives   Aspirin Other (See Comments)     On aspirin 81 mg - Rectal bleeding in dec 2018   Codeine     Headache and makes the patient feel "off"    PAST MEDICAL HISTORY Past Medical History:  Diagnosis Date   Abnormal liver function     in the past.   Anemia    Arthritis    all over   Bruises easily    Cataract    right eye;immature   CHF (congestive heart failure) (HCC)    Chronic back pain    stenosis   Chronic cough    Chronic kidney disease    COPD (chronic obstructive pulmonary disease) ([place])    COVID    aug/sept 2021  Demand myocardial infarction [facility]) 2012   [place] Infarction in setting of Pancreatitis --> mild Troponin elevation, NON-OBSTRUCTIVE CAD   Depression    takes Abilify daily as well as Zoloft   Diastolic heart failure 6979   Grade 1 diastolic Dysfunction by Echo    Diverticulosis    DM (diabetes mellitus) ([place])    takes Victoza daily as well as Lantus and Humalog   Empty sella ([place])    on MRI in 2009.   Glaucoma    Headache(784.0)    last migraine-4-60yrs ago   History of blood transfusion    no abnormal reaction noted   History of colon polyps    benign   HTN (hypertension)    takes Benicar,Imdur,and Bystolic daily   Hyperlipidemia    takes Lipitor daily   Joint swelling    Nocturia    Obesity hypoventilation syndrome ([place])    Obstructive sleep apnea 02/2018   Notably improved split-night study with weight loss from 270 (2017) down to 250 pounds (2019).   Pancreatitis    takes Pancrelipase daily   Parkinson's disease ([place])    takes Sinemet daily   Peripheral neuropathy    Pneumonia 2012   Seizures ([place])    takes Lamictal daily and Primidone nightly;last seizure 2wks ago   Urinary urgency    With increased frequency   Varicose veins of both lower extremities with pain    With edema.  Takes daily Lasix   Past Surgical History:  Procedure Laterality Date   ABDOMINAL HYSTERECTOMY      APPENDECTOMY     BACK SURGERY     Cardiac Event Monitor  September-October 2017   Sinus rhythm with occasional PACs and artifact. No arrhythmias besides one short run of tachycardia.   CHOLECYSTECTOMY     COLONOSCOPY N/A 08/15/2012   Procedure: COLONOSCOPY;  Surgeon: Arta Silence, MD;  Location: [facility];  Service: Endoscopy;  Laterality: N/A;   COLONOSCOPY WITH PROPOFOL Left 06/17/2017   Procedure: COLONOSCOPY WITH PROPOFOL;  Surgeon: [name], MD;  Location: [facility];  Service: Gastroenterology;  Laterality: Left;   CORONARY CALCIUM SCORE AND CTA  04/2018   Coronary calcium score 71.9.  Very large, hyperdynamic LAD wrapping apex giving rise to PDA.  Moderate LAD-diagonal and circumflex plaque -> CT FFR suggested positive findings in distal D1, D2 and distal circumflex.  Referred for cath. ==> FALSE POSITIVE   ESOPHAGOGASTRODUODENOSCOPY     eye cysts Bilateral    LASIK     LEFT HEART CATH AND CORONARY ANGIOGRAPHY N/A 05/16/2018   Procedure: LEFT HEART CATH AND CORONARY ANGIOGRAPHY;  Surgeon: Belva Crome, MD;  Location: [facility] INVASIVE CV LAB:  Angiographically normal coronary arteries with LVEDP of 21 mmHg.  Large draping hyperdominant LAD that wraps the apex and provides distal half of the PDA.  Relatively small caliber distal Cx -->proxLPDA, non-dom RCA. No Cx or Diag lesions -- FALSE + CT FFR   LEFT HEART CATH AND CORONARY ANGIOGRAPHY  2009/March 2012   2009: (Dr. Alvie Heidelberg) Nonobstructive CAD; 2012: Minimal CAD --> false positive stress test   NM MYOVIEW LTD  01/2016; 01/2018   a)LOW RISK. No ischemia or infarction;; b) EF 55-60%. NO ST changes.  No ischemia or Infarction. NO significant RV enlargement.  LOW RISK.   Polysomnogram  02/2018   (Dr. Brett Fairy from neurology): Split study was not adequately done due to low AHI.  She slept reclined and had an AHI less than 10, prolonged hypoxemia and no hypercapnia. -->  She has home oxygen already prescribed, but did not meet criteria for  nightly oxygen.  (Was noted that the patient did not cooperate well with study.  Titration was discussed.   PRESSURE SENSOR/CARDIOMEMS N/A 11/09/2019   Procedure: PRESSURE SENSOR/CARDIOMEMS;  Surgeon: Larey Dresser, MD;  Location: [facility];  Service: Cardiovascular;  Laterality: N/A;   RECTAL POLYPECTOMY     RIGHT/LEFT HEART CATH AND CORONARY ANGIOGRAPHY N/A 04/06/2019   Procedure: RIGHT/LEFT HEART CATH AND CORONARY ANGIOGRAPHY;  Surgeon: Leonie Man, MD;  Location: [facility] INVASIVE CV LAB; angiographically normal coronary arteries.  Moderately elevated AD LVEDP..  Mild pulmonary hypertension: PA P 56/14 mmHg-mean 31 mmHg.  RAP 10 mmHg.  RV P-EDP 54/4 mmHg - 12 mmHg.  PCWP 11-15 mmHg.  CO-CI: 7.35-3.73.   TONSILLECTOMY     TRANSTHORACIC ECHOCARDIOGRAM  01/2016; 05/2017   A) Normal LV chamber size with mod LVH pattern. EF 50-55%. Severe LA dilation. Mod RA dilation. PAP elevated at 38 mmHg;; B) Mild concentric LVH.  EF 55-60% & no RWMA.  Grade 2 DD.  Diastolic flattening of the ventricular septum == ? elevated PAP.  Mild aortic valve calcification/sclerosis.  Mod LA dilation.  Mild RV dilation & Mod RA dilation   TRANSTHORACIC ECHOCARDIOGRAM  10/2017; 01/2018   a) EF 55-60%.  No RWMA,  Moderate LA dilation.  Mild LA dilation.  Peak PA pressures ~57 mmHg (moderate);;; b) EF 55-60%.  GRII DD.  Suggestion of RV volume overload with moderate RV dilation.  Severe LA and RA dilation.Marland Kitchen  PA pressure ~67%.   TRANSTHORACIC ECHOCARDIOGRAM  01/2019   Normal LV size and function.  EF 50 to 55%.  Impaired relaxation.  RV appears to have moderately reduced function.  Severely enlarged.  Cannot fully assess RV pressures.  Hypermobile interatrial septum.  Right atrium severely dilated.   TUBAL LIGATION     vaginal cyst removed     several times    FAMILY HISTORY Family History  Problem Relation Age of Onset   Allergies Father    Heart disease Father        before 5   Heart failure Father     Hypertension Father    Hyperlipidemia Father    Heart disease Mother    Diabetes Mother    Hypertension Mother    Hyperlipidemia Mother    Hypertension Sister    Heart disease Sister        before 27   Hyperlipidemia Sister    Diabetes Brother    Hypertension Brother    Diabetes Sister    Hypertension Sister    Diabetes Son    Hypertension Son    Cancer Other     SOCIAL HISTORY Social History   Tobacco Use   Smoking status: Former    Packs/day: 0.50    Years: 25.00    Pack years: 12.50    Types: Cigarettes   Smokeless tobacco: Never   Tobacco comments:    quit smoking 20+ytrs ago  Vaping Use   Vaping Use: Never used  Substance Use Topics   Alcohol use: No   Drug use: No         OPHTHALMIC EXAM:  Base Eye Exam     Visual Acuity (Snellen - Linear)       Right Left   Dist cc 20/50 +2 20/30   Dist ph cc 20/40 +1 NI    Correction: Glasses         Tonometry (Tonopen, 2:21  PM)       Right Left   Pressure 13 12         Pupils       Dark Light Shape React APD   Right 3 2 Round Brisk None   Left 3 2 Round Brisk None         Visual Fields (Counting fingers)       Left Right    Full Full         Extraocular Movement       Right Left    Full Full         Neuro/Psych     Oriented x3: Yes   Mood/Affect: Normal         Dilation     Both eyes: 1.0% Mydriacyl, 2.5% Phenylephrine @ 2:21 PM           Slit Lamp and Fundus Exam     Slit Lamp Exam       Right Left   Lids/Lashes Dermatochalasis - upper lid, mild MGD, mild ptosis Dermatochalasis - upper lid, mild MGD, mild ptosis   Conjunctiva/Sclera mild melanosis mild melanosis   Cornea Mild arcus, Trace endo pigment Mild arcus, trace PEE, trace endo pigment   Anterior Chamber deep, clear, narrow angle, PI at 0900 deep, clear, narrow angle, PI at 0900   Iris Round and dilated, No NVI Round and dilated, No NVI   Lens PC IOL in good position with open PC PC IOL in good  position with open PC   Vitreous Vitreous syneresis, Posterior vitreous detachment, Weiss ring, vitreous condensations improving Vitreous syneresis, Posterior vitreous detachment, Weiss ring, vitreous condensations         Fundus Exam       Right Left   Disc Mild pallor and sharp rim, focal PPA/PPP Mild Pallor, sharp rim, PPP   C/D Ratio 0.6 0.6   Macula Flat, Blunted foveal reflex, RPE mottling and clumping, No heme or edema Flat, Blunted foveal reflex, RPE mottling and clumping, No heme or edema   Vessels attenuated, Tortuous attenuated, Tortuous   Periphery Attached, pigmented CR scar/lattice at 0430, focal pigmented lattice at 0730 with atrophic hole and +focal SRF, shallow schisis anterior to lattice -- good laser changes surrounding both lesions, no new RT/RD Attached, focal pigmented lattice at 0430 and 0500 (atrophic hole) -- good laser surrounding both lesions, no new RT/RD           Refraction     Wearing Rx       Sphere Cylinder Axis Add   Right +0.00 +1.25 053 +2.25   Left -0.75 +1.00 118 +2.25            IMAGING AND PROCEDURES  Imaging and Procedures for 02/06/2021  OCT, Retina - OU - Both Eyes       Right Eye Quality was borderline. Central Foveal Thickness: 250. Progression has been stable. Findings include abnormal foveal contour, no IRF, no SRF (Foveal notch; interval improvement of vitreous opacities; peripheral schisis and focal SRF with retinal break caught on widefield -- not imaged today).   Left Eye Quality was borderline. Central Foveal Thickness: 247. Progression has been stable. Findings include normal foveal contour, no IRF, no SRF, retinal drusen (Rare drusen).   Notes *Images captured and stored on drive  Diagnosis / Impression:  OD: Foveal notch; interval improvement of vitreous opacities; peripheral schisis and focal SRF with retinal break caught on widefield -- not imaged today OS: NFP, no IRF/SRF, rare  drusen  Clinical management:   See below  Abbreviations: NFP - Normal foveal profile. CME - cystoid macular edema. PED - pigment epithelial detachment. IRF - intraretinal fluid. SRF - subretinal fluid. EZ - ellipsoid zone. ERM - epiretinal membrane. ORA - outer retinal atrophy. ORT - outer retinal tubulation. SRHM - subretinal hyper-reflective material. IRHM - intraretinal hyper-reflective material            ASSESSMENT/PLAN:    ICD-10-CM   1. Bilateral retinal lattice degeneration  H35.413     2. Retinal hole of both eyes  H33.323     3. Retinal detachment, right  H33.21     4. Retinal edema  H35.81 OCT, Retina - OU - Both Eyes    5. Right retinoschisis  H33.101     6. Diabetes mellitus type 2 without retinopathy ([place])  E11.9     7. Essential hypertension  I10     8. Hypertensive retinopathy of both eyes  H35.033     9. Pseudophakia, both eyes  Z96.1      1-4. Lattice degeneration w/ atrophic holes, both eyes - OD: focal pigmented lattice at 0430 and 0730 with atrophic hole, +focal SRF - OS: focal pigmented lattice at 0430 and 0500 (atrophic hole) - s/p laser retinopexy OD (05.04.22) -- good laser changes - s/p laser retinopexy OS (05.06.22) -- good laser changes - no new RT/RD or lattice - pt is cleared from a retina standpoint for release to Dr. Midge Aver and resumption of primary eye care - pt can f/u here prn  5. Retinoschisis OD  - shallow peripheral retinoschisis in IT quad, anterior to 0730 lattice w/ break  - discussed findings, prognosis and treatment options   - s/p laser retinopexy OD (05.04.22) along with pexy for lattice above  6. Diabetes mellitus, type 2 without retinopathy - The incidence, risk factors for progression, natural history and treatment options for diabetic retinopathy  were discussed with patient.   - The need for close monitoring of blood glucose, blood pressure, and serum lipids, avoiding cigarette or any type of tobacco, and the need for long term follow up was  also discussed with patient.  7,8. Hypertensive retinopathy OU - discussed importance of tight BP control - monitor  9. Pseudophakia OU  - s/p CE/IOL OU  - IOL in good position, doing well - monitor  Ophthalmic Meds Ordered this visit:  No orders of the defined types were placed in this encounter.     Return if symptoms worsen or fail to improve.  There are no Patient Instructions on file for this visit.  This document serves as a record of services personally performed by Gardiner Sleeper, MD, PhD. It was created on their behalf by Estill Bakes, COT an ophthalmic technician. The creation of this record is the provider's dictation and/or activities during the visit.    Electronically signed by: Estill Bakes, COT 8.15.22 @ 5:24 PM   Gardiner Sleeper, M.D., Ph.D. Diseases & Surgery of the Retina and [place] 02/06/2021   I have reviewed the above documentation for accuracy and completeness, and I agree with the above. Gardiner Sleeper, M.D., Ph.D. 02/06/21 5:25 PM  Abbreviations: M myopia (nearsighted); A astigmatism; H hyperopia (farsighted); P presbyopia; Mrx spectacle prescription;  CTL contact lenses; OD right eye; OS left eye; OU both eyes  XT exotropia; ET esotropia; PEK punctate epithelial keratitis; PEE punctate epithelial erosions; DES dry eye syndrome; MGD meibomian gland dysfunction;  ATs artificial tears; PFAT's preservative free artificial tears; [place] nuclear sclerotic cataract; PSC posterior subcapsular cataract; ERM epi-retinal membrane; PVD posterior vitreous detachment; RD retinal detachment; DM diabetes mellitus; DR diabetic retinopathy; NPDR non-proliferative diabetic retinopathy; PDR proliferative diabetic retinopathy; CSME clinically significant macular edema; DME diabetic macular edema; dbh dot blot hemorrhages; CWS cotton wool spot; POAG primary open angle glaucoma; C/D cup-to-disc ratio; HVF humphrey visual field; GVF goldmann  visual field; OCT optical coherence tomography; IOP intraocular pressure; BRVO Branch retinal vein occlusion; CRVO central retinal vein occlusion; CRAO central retinal artery occlusion; BRAO branch retinal artery occlusion; RT retinal tear; SB scleral buckle; PPV pars plana vitrectomy; VH Vitreous hemorrhage; PRP panretinal laser photocoagulation; IVK intravitreal kenalog; VMT vitreomacular traction; MH Macular hole;  NVD neovascularization of the disc; NVE neovascularization elsewhere; AREDS age related eye disease study; ARMD age related macular degeneration; POAG primary open angle glaucoma; EBMD epithelial/anterior basement membrane dystrophy; ACIOL anterior chamber intraocular lens; IOL intraocular lens; PCIOL posterior chamber intraocular lens; Phaco/IOL phacoemulsification with intraocular lens placement; [place] photorefractive keratectomy; LASIK laser assisted in situ keratomileusis; HTN hypertension; DM diabetes mellitus; COPD chronic obstructive pulmonary disease

## 2021-02-06 ENCOUNTER — Encounter (INDEPENDENT_AMBULATORY_CARE_PROVIDER_SITE_OTHER): Payer: Self-pay | Admitting: Ophthalmology

## 2021-02-06 ENCOUNTER — Ambulatory Visit (INDEPENDENT_AMBULATORY_CARE_PROVIDER_SITE_OTHER): Payer: Medicare HMO | Admitting: Ophthalmology

## 2021-02-06 ENCOUNTER — Other Ambulatory Visit: Payer: Self-pay

## 2021-02-06 DIAGNOSIS — H35413 Lattice degeneration of retina, bilateral: Secondary | ICD-10-CM

## 2021-02-06 DIAGNOSIS — H33323 Round hole, bilateral: Secondary | ICD-10-CM

## 2021-02-06 DIAGNOSIS — H3581 Retinal edema: Secondary | ICD-10-CM | POA: Diagnosis not present

## 2021-02-06 DIAGNOSIS — H35033 Hypertensive retinopathy, bilateral: Secondary | ICD-10-CM | POA: Diagnosis not present

## 2021-02-06 DIAGNOSIS — Z961 Presence of intraocular lens: Secondary | ICD-10-CM | POA: Diagnosis not present

## 2021-02-06 DIAGNOSIS — H33101 Unspecified retinoschisis, right eye: Secondary | ICD-10-CM

## 2021-02-06 DIAGNOSIS — E119 Type 2 diabetes mellitus without complications: Secondary | ICD-10-CM

## 2021-02-06 DIAGNOSIS — I10 Essential (primary) hypertension: Secondary | ICD-10-CM

## 2021-02-06 DIAGNOSIS — H3321 Serous retinal detachment, right eye: Secondary | ICD-10-CM

## 2021-02-08 ENCOUNTER — Telehealth: Payer: Self-pay

## 2021-02-08 NOTE — Chronic Care Management (AMB) (Addendum)
Chronic Care Management Pharmacy Assistant   Name: [name]  MRN: [id number] DOB: 09/13/1945  Reason for Encounter: Disease State/ Hypertension  Recent office visits:  None  Recent consult visits:  02-06-2021 Rennis Chris, MD (Ophthalmology)  Hospital visits:  Medication Reconciliation was completed by comparing discharge summary, patient's EMR and Pharmacy list, and upon discussion with patient.  Admitted to the hospital on 12-23-2020 due to chest pain. Discharge date was 12-24-2020 Discharged from [facility].    New?Medications Started at [facility] Discharge:?? None  Medication Changes at Hospital Discharge: None  Medications Discontinued at Hospital Discharge: None  Medications that remain the same after Hospital Discharge:??  -All other medications will remain the same.    Medications: Outpatient Encounter Medications as of 02/08/2021  Medication Sig Note   acetaminophen (TYLENOL) 500 MG tablet Take 500-1,000 mg by mouth every 4 (four) hours as needed for moderate pain.     albuterol (VENTOLIN HFA) 108 (90 Base) MCG/ACT inhaler Inhale 1-2 puffs into the lungs every 6 (six) hours as needed for wheezing or shortness of breath. (Patient not taking: Reported on 02/06/2021)    Alcohol Swabs (B-D SINGLE USE SWABS REGULAR) PADS USE TWICE DAILY AS DIRECTED    amLODipine (NORVASC) 5 MG tablet Take 1 tablet (5 mg total) by mouth daily.    Ascorbic Acid (VITAMIN C) 1000 MG tablet Take 1,000 mg by mouth daily.    atorvastatin (LIPITOR) 10 MG tablet TAKE 1 TABLET EVERY EVENING. 01/11/2021: .    B-D ULTRAFINE III SHORT PEN 31G X 8 MM MISC USE DAILY WITH VICTOZA AND/OR NOVOLOG.    benzonatate (TESSALON PERLES) 100 MG capsule Take 1 capsule (100 mg total) by mouth 3 (three) times daily as needed for cough. 02/06/2021: As needed    Blood Glucose Calibration (TRUE METRIX LEVEL 1) Low SOLN Use as directed  Dx:e11.22    Blood Glucose Monitoring Suppl (TRUE METRIX METER)  w/Device KIT Use as directed to check blood sugars 2 times per day dx: e11.22    carvedilol (COREG) 25 MG tablet TAKE 1 TABLET TWICE DAILY WITH A MEAL    dexlansoprazole (DEXILANT) 60 MG capsule Take 1 capsule (60 mg total) by mouth daily.    diclofenac sodium (VOLTAREN) 1 % GEL Apply 2 g topically 4 (four) times daily as needed (pain).    ferrous sulfate 324 MG TBEC Take 324 mg by mouth 2 (two) times daily.    fluticasone furoate-vilanterol (BREO ELLIPTA) 100-25 MCG/INH AEPB Inhale 1 puff into the lungs daily.    furosemide (LASIX) 80 MG tablet TAKE 1 TABLET DAILY ON MONDAY THROUGH FRIDAY, AND 1/2 TABLET ON SATURDAY AND SUNDAY    glucose blood (TRUE METRIX BLOOD GLUCOSE TEST) test strip Use as directed to check blood sugars 2 times per day dx: e11.22    guaiFENesin-dextromethorphan (ROBITUSSIN DM) 100-10 MG/5ML syrup Take 10 mLs by mouth every 6 (six) hours as needed for cough. (Patient not taking: Reported on 02/06/2021)    hydrALAZINE (APRESOLINE) 100 MG tablet Take 1 tablet (100 mg total) by mouth 3 (three) times daily.    hydrocortisone cream 1 % Apply 1 application topically daily as needed for itching.     insulin lispro (HUMALOG KWIKPEN) 100 UNIT/ML KwikPen Inject 0.02-0.04 mLs (2-4 Units total) into the skin 3 (three) times daily as needed (high blood sugar over 150).    Isopropyl Alcohol (ALCOHOL WIPES) 70 % MISC Apply 1 each topically 2 (two) times daily.    isosorbide mononitrate (  IMDUR) 30 MG 24 hr tablet TAKE 1 TABLET EVERY DAY    lamoTRIgine (LAMICTAL) 150 MG tablet Take 1 tablet (150 mg total) by mouth 2 (two) times daily.    levETIRAcetam (KEPPRA XR) 500 MG 24 hr tablet Take 3 tablets every night    ondansetron (ZOFRAN) 4 MG tablet Take 4 mg by mouth every 8 (eight) hours as needed for nausea or vomiting. 02/06/2021: As needed   OZEMPIC, 1 MG/DOSE, 4 MG/3ML SOPN INJECT 1 MG INTO THE SKIN ONCE A WEEK.    Pancrelipase, Lip-Prot-Amyl, 25000-79000 units CPEP Take 1-2 capsules by mouth  See admin instructions. Take 1 capsules in the morning with breakfast, 1 capsule with lunch, and 2 capsules with supper    potassium chloride SA (KLOR-CON) 20 MEQ tablet TAKE 2 TABLETS EVERY DAY BUT WHEN TAKING METOLAZONE TAKE 3 TABLETS DAILY AS DIRECTED (Patient taking differently: Take 40 mEq by mouth daily.)    sennosides-docusate sodium (SENOKOT-S) 8.6-50 MG tablet Take 1 tablet by mouth daily.    sertraline (ZOLOFT) 50 MG tablet TAKE 1 TABLET EVERY DAY    tiotropium (SPIRIVA HANDIHALER) 18 MCG inhalation capsule Place 1 capsule (18 mcg total) into inhaler and inhale daily.    tolnaftate (TINACTIN) 1 % cream Apply 1 application topically daily as needed (foot fungus).     TOUJEO SOLOSTAR 300 UNIT/ML Solostar Pen Inject 25 Units into the skin at bedtime.    TRUEplus Lancets 33G MISC Use as directed to check blood sugars 2 times per day dx: e11.22    No facility-administered encounter medications on file as of 02/08/2021.   Reviewed chart prior to disease state call. Spoke with patient regarding BP  Recent Office Vitals: BP Readings from Last 3 Encounters:  01/11/21 (!) 150/80  01/09/21 124/72  12/24/20 111/63   Pulse Readings from Last 3 Encounters:  01/11/21 66  01/09/21 68  12/24/20 68    Wt Readings from Last 3 Encounters:  01/11/21 199 lb 3.2 oz (90.4 kg)  01/09/21 195 lb 6.4 oz (88.6 kg)  12/23/20 197 lb 1.5 oz (89.4 kg)     Kidney Function Lab Results  Component Value Date/Time   CREATININE 0.85 01/09/2021 03:56 PM   CREATININE 0.82 12/23/2020 06:45 PM   CREATININE 0.91 10/04/2016 11:44 AM   GFR 86.68 05/15/2016 01:35 PM   GFRNONAA >60 12/23/2020 06:45 PM   GFRNONAA 65 10/04/2016 11:44 AM   GFRAA 75 06/06/2020 04:00 PM   GFRAA 75 10/04/2016 11:44 AM    BMP Latest Ref Rng & Units 01/09/2021 12/23/2020 11/18/2020  Glucose 65 - 99 mg/dL 94 97 156(H)  BUN 8 - 27 mg/dL _0 Creatinine 0.57 - 1.00 mg/dL 0.85 0.82 0.88  BUN/Creat Ratio 12 - 28 13 - -  Sodium 134 -  144 mmol/L 135 135 130(L)  Potassium 3.5 - 5.2 mmol/L 4.1 3.6 4.2  Chloride 96 - 106 mmol/L 96 97(L) 90(L)  CO2 20 - 29 mmol/L 28 30 35(H)  Calcium 8.7 - 10.3 mg/dL 9.2 9.1 9.3    Current antihypertensive regimen:  Carvedilol 25 mg twice daily Hydralazine 25 mg 4 tablet 3 times daily (Patient states some days when she's out she doesn't take 3 times a day just once)  How often are you checking your Blood Pressure? daily  Current home BP readings: 140/67  What recent interventions/DTPs have been made by any provider to improve Blood Pressure control since last CPP Visit: Patient states she is taking her medications as directed  Any recent hospitalizations or ED visits since last visit with CPP? Yes  What diet changes have been made to improve Blood Pressure Control?  Patient states she she eats fresh fruit/ vegetables and drinks plenty of water. Patient states she has limited her salt intake  What exercise is being done to improve your Blood Pressure Control?  Patient states she walks daily outside if the weather is good. Patient states she tries to walk about 30 minutes daily  Adherence Review: Is the patient currently on ACE/ARB medication? No Does the patient have >5 day gap between last estimated fill dates? No  NOTES: Rescheduled patient's appointment on 02-21-2021 per [name] to November.  Care Gaps: Shingrix overdue Yearly foot exam overdue RAF= 5.404% Medicare wellness needs scheduling  Star Rating Drugs: Atorvastatin 10 mg- Last filled 01-02-2021 90 DS [employer] Ozempic 1 mg- Last filled 12-30-2020 [facility] Clinical Pharmacist Assistant [phone]

## 2021-02-12 ENCOUNTER — Encounter: Payer: Self-pay | Admitting: Internal Medicine

## 2021-02-13 DIAGNOSIS — E119 Type 2 diabetes mellitus without complications: Secondary | ICD-10-CM | POA: Diagnosis not present

## 2021-02-13 DIAGNOSIS — H43813 Vitreous degeneration, bilateral: Secondary | ICD-10-CM | POA: Diagnosis not present

## 2021-02-13 DIAGNOSIS — Z794 Long term (current) use of insulin: Secondary | ICD-10-CM | POA: Diagnosis not present

## 2021-02-13 DIAGNOSIS — H31093 Other chorioretinal scars, bilateral: Secondary | ICD-10-CM | POA: Diagnosis not present

## 2021-02-13 DIAGNOSIS — Z9889 Other specified postprocedural states: Secondary | ICD-10-CM | POA: Diagnosis not present

## 2021-02-13 DIAGNOSIS — Z961 Presence of intraocular lens: Secondary | ICD-10-CM | POA: Diagnosis not present

## 2021-02-13 LAB — HM DIABETES EYE EXAM

## 2021-02-14 ENCOUNTER — Encounter: Payer: Self-pay | Admitting: Internal Medicine

## 2021-02-20 DIAGNOSIS — K5900 Constipation, unspecified: Secondary | ICD-10-CM | POA: Diagnosis not present

## 2021-02-20 DIAGNOSIS — K469 Unspecified abdominal hernia without obstruction or gangrene: Secondary | ICD-10-CM | POA: Diagnosis not present

## 2021-02-20 DIAGNOSIS — K921 Melena: Secondary | ICD-10-CM | POA: Diagnosis not present

## 2021-02-21 ENCOUNTER — Telehealth: Payer: Medicare HMO

## 2021-02-23 ENCOUNTER — Telehealth: Payer: Medicare HMO

## 2021-02-23 ENCOUNTER — Telehealth: Payer: Self-pay

## 2021-02-23 NOTE — Telephone Encounter (Signed)
  Care Management   Follow Up Note   02/23/2021 Name: [name] MRN: [id number] DOB: 10-27-1945   Referred by: [name], MD Reason for referral : Chronic Care Management (RN CM Follow up call - 2nd attempt )   A second unsuccessful telephone outreach was attempted today. The patient was referred to the case management team for assistance with care management and care coordination.   Follow Up Plan: A HIPPA compliant phone message was left for the patient providing contact information and requesting a return call.   Barb Merino, RN, BSN, CCM Care Management Coordinator [place] [facility]  Direct Phone: [phone]

## 2021-03-01 ENCOUNTER — Telehealth: Payer: Self-pay

## 2021-03-01 NOTE — Chronic Care Management (AMB) (Signed)
Chronic Care Management Pharmacy Assistant   Name: [name]  MRN: [id number] DOB: 23-Dec-1945  Reason for Encounter: Disease State/ Diabetes  Recent office visits:  None  Recent consult visits:  None  Hospital visits:  Medication Reconciliation was completed by comparing discharge summary, patient's EMR and Pharmacy list, and upon discussion with patient.   Admitted to the hospital on 12-23-2020 due to chest pain. Discharge date was 12-24-2020 Discharged from [place]?Medications Started at [facility] Discharge:?? None   Medication Changes at Hospital Discharge: None   Medications Discontinued at Hospital Discharge: None   Medications that remain the same after Hospital Discharge:??  -All other medications will remain the same.      Medications: Outpatient Encounter Medications as of 03/01/2021  Medication Sig Note   acetaminophen (TYLENOL) 500 MG tablet Take 500-1,000 mg by mouth every 4 (four) hours as needed for moderate pain.     albuterol (VENTOLIN HFA) 108 (90 Base) MCG/ACT inhaler Inhale 1-2 puffs into the lungs every 6 (six) hours as needed for wheezing or shortness of breath. (Patient not taking: Reported on 02/06/2021)    Alcohol Swabs (B-D SINGLE USE SWABS REGULAR) PADS USE TWICE DAILY AS DIRECTED    amLODipine (NORVASC) 5 MG tablet Take 1 tablet (5 mg total) by mouth daily.    Ascorbic Acid (VITAMIN C) 1000 MG tablet Take 1,000 mg by mouth daily.    atorvastatin (LIPITOR) 10 MG tablet TAKE 1 TABLET EVERY EVENING. 01/11/2021: .    B-D ULTRAFINE III SHORT PEN 31G X 8 MM MISC USE DAILY WITH VICTOZA AND/OR NOVOLOG.    benzonatate (TESSALON PERLES) 100 MG capsule Take 1 capsule (100 mg total) by mouth 3 (three) times daily as needed for cough. 02/06/2021: As needed    Blood Glucose Calibration (TRUE METRIX LEVEL 1) Low SOLN Use as directed  Dx:e11.22    Blood Glucose Monitoring Suppl (TRUE METRIX METER) w/Device KIT Use as directed to check blood  sugars 2 times per day dx: e11.22    carvedilol (COREG) 25 MG tablet TAKE 1 TABLET TWICE DAILY WITH A MEAL    dexlansoprazole (DEXILANT) 60 MG capsule Take 1 capsule (60 mg total) by mouth daily.    diclofenac sodium (VOLTAREN) 1 % GEL Apply 2 g topically 4 (four) times daily as needed (pain).    ferrous sulfate 324 MG TBEC Take 324 mg by mouth 2 (two) times daily.    furosemide (LASIX) 80 MG tablet TAKE 1 TABLET DAILY ON MONDAY THROUGH FRIDAY, AND 1/2 TABLET ON SATURDAY AND SUNDAY    glucose blood (TRUE METRIX BLOOD GLUCOSE TEST) test strip Use as directed to check blood sugars 2 times per day dx: e11.22    guaiFENesin-dextromethorphan (ROBITUSSIN DM) 100-10 MG/5ML syrup Take 10 mLs by mouth every 6 (six) hours as needed for cough. (Patient not taking: Reported on 02/06/2021)    hydrALAZINE (APRESOLINE) 100 MG tablet Take 1 tablet (100 mg total) by mouth 3 (three) times daily.    hydrocortisone cream 1 % Apply 1 application topically daily as needed for itching.     insulin lispro (HUMALOG KWIKPEN) 100 UNIT/ML KwikPen Inject 0.02-0.04 mLs (2-4 Units total) into the skin 3 (three) times daily as needed (high blood sugar over 150).    Isopropyl Alcohol (ALCOHOL WIPES) 70 % MISC Apply 1 each topically 2 (two) times daily.    isosorbide mononitrate (IMDUR) 30 MG 24 hr tablet TAKE 1 TABLET EVERY DAY  lamoTRIgine (LAMICTAL) 150 MG tablet Take 1 tablet (150 mg total) by mouth 2 (two) times daily.    levETIRAcetam (KEPPRA XR) 500 MG 24 hr tablet Take 3 tablets every night    ondansetron (ZOFRAN) 4 MG tablet Take 4 mg by mouth every 8 (eight) hours as needed for nausea or vomiting. 02/06/2021: As needed   OZEMPIC, 1 MG/DOSE, 4 MG/3ML SOPN INJECT 1 MG INTO THE SKIN ONCE A WEEK.    Pancrelipase, Lip-Prot-Amyl, 25000-79000 units CPEP Take 1-2 capsules by mouth See admin instructions. Take 1 capsules in the morning with breakfast, 1 capsule with lunch, and 2 capsules with supper    potassium chloride SA  (KLOR-CON) 20 MEQ tablet TAKE 2 TABLETS EVERY DAY BUT WHEN TAKING METOLAZONE TAKE 3 TABLETS DAILY AS DIRECTED (Patient taking differently: Take 40 mEq by mouth daily.)    sennosides-docusate sodium (SENOKOT-S) 8.6-50 MG tablet Take 1 tablet by mouth daily.    sertraline (ZOLOFT) 50 MG tablet TAKE 1 TABLET EVERY DAY    tiotropium (SPIRIVA HANDIHALER) 18 MCG inhalation capsule Place 1 capsule (18 mcg total) into inhaler and inhale daily.    tolnaftate (TINACTIN) 1 % cream Apply 1 application topically daily as needed (foot fungus).     TOUJEO SOLOSTAR 300 UNIT/ML Solostar Pen Inject 25 Units into the skin at bedtime.    TRUEplus Lancets 33G MISC Use as directed to check blood sugars 2 times per day dx: e11.22    No facility-administered encounter medications on file as of 03/01/2021.   Recent Relevant Labs: Lab Results  Component Value Date/Time   HGBA1C 8.4 (H) 01/09/2021 03:56 PM   HGBA1C 7.7 (H) 09/26/2020 11:46 AM   MICROALBUR 10 06/06/2020 01:23 PM   MICROALBUR 10 01/06/2020 12:16 PM    Kidney Function Lab Results  Component Value Date/Time   CREATININE 0.85 01/09/2021 03:56 PM   CREATININE 0.82 12/23/2020 06:45 PM   CREATININE 0.91 10/04/2016 11:44 AM   GFR 86.68 05/15/2016 01:35 PM   GFRNONAA >60 12/23/2020 06:45 PM   GFRNONAA 65 10/04/2016 11:44 AM   GFRAA 75 06/06/2020 04:00 PM   GFRAA 75 10/04/2016 11:44 AM    Current antihyperglycemic regimen:  Ozempic 1 mg weekly Toujeo 25 units nightly  What recent interventions/DTPs have been made to improve glycemic control:  Patient states she's been taking medication as directed   Have there been any recent hospitalizations or ED visits since last visit with CPP? No  Patient denies hypoglycemic symptoms  Patient denies hyperglycemic symptoms  How often are you checking your blood sugar? once daily and 3-4 times daily  What are your blood sugars ranging?  Fasting: 97, 125, 111, 133 Before meals: None After meals: 111, 134,  118 Bedtime: 130, 98, 125  During the week, how often does your blood glucose drop below 70? Never  Are you checking your feet daily/regularly? Patient states daily  Adherence Review: Is the patient currently on a STATIN medication? Yes Is the patient currently on ACE/ARB medication? No Does the patient have >5 day gap between last estimated fill dates? No   Care Gaps: Shingrix overdue Yearly foot exam overdue RAF= 5.404% Medicare wellness needs scheduling  Star Rating Drugs: Atorvastatin 10 mg- Last filled 01-02-2021 90 DS [employer] Ozempic 1 mg- Last filled 12-30-2020 [place] Clinical Pharmacist Assistant [phone]

## 2021-03-07 ENCOUNTER — Telehealth: Payer: Self-pay

## 2021-03-07 ENCOUNTER — Other Ambulatory Visit: Payer: Self-pay | Admitting: Internal Medicine

## 2021-03-07 ENCOUNTER — Telehealth: Payer: Medicare HMO

## 2021-03-07 NOTE — Telephone Encounter (Signed)
  Care Management   Follow Up Note   03/07/2021 Name: [name] MRN: [id number] DOB: Apr 24, 1946   Referred by: [name], MD Reason for referral : Chronic Care Management (Unsuccessful call)   An unsuccessful telephone outreach was attempted today. The patient was referred to the case management team for assistance with care management and care coordination. SW left a HIPAA compliant voice message requesting a return call.  Follow Up Plan: The care management team will reach out to the patient again over the next 21 days.   Daneen Schick, BSW, CDP Social Worker, Certified Dementia Practitioner [place] [facility] [place] [facility] [phone]

## 2021-03-10 ENCOUNTER — Other Ambulatory Visit (HOSPITAL_COMMUNITY): Payer: Self-pay

## 2021-03-11 ENCOUNTER — Other Ambulatory Visit: Payer: Self-pay | Admitting: Cardiology

## 2021-03-14 ENCOUNTER — Ambulatory Visit: Payer: Self-pay

## 2021-03-14 ENCOUNTER — Telehealth: Payer: Medicare HMO

## 2021-03-14 DIAGNOSIS — I5032 Chronic diastolic (congestive) heart failure: Secondary | ICD-10-CM

## 2021-03-14 DIAGNOSIS — F339 Major depressive disorder, recurrent, unspecified: Secondary | ICD-10-CM

## 2021-03-14 DIAGNOSIS — E1122 Type 2 diabetes mellitus with diabetic chronic kidney disease: Secondary | ICD-10-CM

## 2021-03-14 NOTE — Chronic Care Management (AMB) (Signed)
  Care Management   Follow Up Note   03/14/2021 Name: [name] MRN: [id number] DOB: January 31, 1946   Referred by: [name], MD Reason for referral : Chronic Care Management (RN CM Follow up (3rd attempt))   Third unsuccessful telephone outreach was attempted today. The patient was referred to the case management team for assistance with care management and care coordination. The patient's primary care provider has been notified of our unsuccessful attempts to make or maintain contact with the patient. The care management team is pleased to engage with this patient at any time in the future should he/she be interested in assistance from the care management team.   Follow Up Plan: A HIPPA compliant phone message was left for the patient providing contact information and requesting a return call.   Barb Merino, RN, BSN, CCM Care Management Coordinator [place] [facility]  Direct Phone: [phone]

## 2021-03-21 ENCOUNTER — Telehealth: Payer: Self-pay

## 2021-03-21 ENCOUNTER — Telehealth: Payer: Medicare HMO

## 2021-03-21 NOTE — Telephone Encounter (Signed)
  Care Management   Follow Up Note   03/21/2021 Name: PATIENCE NUZZO MRN: [id number] DOB: [birth year]   Referred by: [name], MD Reason for referral : Chronic Care Management (Unsuccessful call)   A second unsuccessful telephone outreach was attempted today. The patient was referred to the case management team for assistance with care management and care coordination. SW left a HIPAA compliant voice message requesting a return call.  Follow Up Plan: The care management team will reach out to the patient again over the next 14 days.   Daneen Schick, BSW, CDP Social Worker, Certified Dementia Practitioner [place] [facility] [place] [facility] [phone]

## 2021-03-23 ENCOUNTER — Other Ambulatory Visit: Payer: Self-pay | Admitting: Internal Medicine

## 2021-03-27 ENCOUNTER — Ambulatory Visit (INDEPENDENT_AMBULATORY_CARE_PROVIDER_SITE_OTHER): Payer: Medicare HMO | Admitting: Internal Medicine

## 2021-03-27 ENCOUNTER — Encounter: Payer: Self-pay | Admitting: Internal Medicine

## 2021-03-27 ENCOUNTER — Other Ambulatory Visit: Payer: Self-pay

## 2021-03-27 VITALS — BP 128/70 | HR 70 | Temp 98.5°F | Ht 59.2 in | Wt 194.2 lb

## 2021-03-27 DIAGNOSIS — I25118 Atherosclerotic heart disease of native coronary artery with other forms of angina pectoris: Secondary | ICD-10-CM | POA: Diagnosis not present

## 2021-03-27 DIAGNOSIS — I110 Hypertensive heart disease with heart failure: Secondary | ICD-10-CM

## 2021-03-27 DIAGNOSIS — Z23 Encounter for immunization: Secondary | ICD-10-CM

## 2021-03-27 DIAGNOSIS — Z113 Encounter for screening for infections with a predominantly sexual mode of transmission: Secondary | ICD-10-CM | POA: Diagnosis not present

## 2021-03-27 DIAGNOSIS — Z794 Long term (current) use of insulin: Secondary | ICD-10-CM

## 2021-03-27 DIAGNOSIS — N182 Chronic kidney disease, stage 2 (mild): Secondary | ICD-10-CM | POA: Diagnosis not present

## 2021-03-27 DIAGNOSIS — R413 Other amnesia: Secondary | ICD-10-CM | POA: Diagnosis not present

## 2021-03-27 DIAGNOSIS — E1122 Type 2 diabetes mellitus with diabetic chronic kidney disease: Secondary | ICD-10-CM

## 2021-03-27 DIAGNOSIS — Z6838 Body mass index (BMI) 38.0-38.9, adult: Secondary | ICD-10-CM

## 2021-03-27 DIAGNOSIS — I5032 Chronic diastolic (congestive) heart failure: Secondary | ICD-10-CM

## 2021-03-27 DIAGNOSIS — M069 Rheumatoid arthritis, unspecified: Secondary | ICD-10-CM

## 2021-03-27 DIAGNOSIS — K439 Ventral hernia without obstruction or gangrene: Secondary | ICD-10-CM | POA: Diagnosis not present

## 2021-03-27 MED ORDER — GVOKE HYPOPEN 1-PACK 0.5 MG/0.1ML ~~LOC~~ SOAJ: 0.5000 mg | Freq: Every day | SUBCUTANEOUS | 11 refills | Status: DC | PRN

## 2021-03-27 NOTE — Patient Instructions (Signed)
Diabetes Mellitus and Nutrition, Adult When you have diabetes, or diabetes mellitus, it is very important to have healthy eating habits because your blood sugar (glucose) levels are greatly affected by what you eat and drink. Eating healthy foods in the right amounts, at about the same times every day, can help you:  Control your blood glucose.  Lower your risk of heart disease.  Improve your blood pressure.  Reach or maintain a healthy weight. What can affect my meal plan? Every person with diabetes is different, and each person has different needs for a meal plan. Your health care provider may recommend that you work with a dietitian to make a meal plan that is best for you. Your meal plan may vary depending on factors such as:  The calories you need.  The medicines you take.  Your weight.  Your blood glucose, blood pressure, and cholesterol levels.  Your activity level.  Other health conditions you have, such as heart or kidney disease. How do carbohydrates affect me? Carbohydrates, also called carbs, affect your blood glucose level more than any other type of food. Eating carbs naturally raises the amount of glucose in your blood. Carb counting is a method for keeping track of how many carbs you eat. Counting carbs is important to keep your blood glucose at a healthy level, especially if you use insulin or take certain oral diabetes medicines. It is important to know how many carbs you can safely have in each meal. This is different for every person. Your dietitian can help you calculate how many carbs you should have at each meal and for each snack. How does alcohol affect me? Alcohol can cause a sudden decrease in blood glucose (hypoglycemia), especially if you use insulin or take certain oral diabetes medicines. Hypoglycemia can be a life-threatening condition. Symptoms of hypoglycemia, such as sleepiness, dizziness, and confusion, are similar to symptoms of having too much  alcohol.  Do not drink alcohol if: ? Your health care provider tells you not to drink. ? You are pregnant, may be pregnant, or are planning to become pregnant.  If you drink alcohol: ? Do not drink on an empty stomach. ? Limit how much you use to:  0-1 drink a day for women.  0-2 drinks a day for men. ? Be aware of how much alcohol is in your drink. In the [place] one drink equals one 12 oz bottle of beer (355 mL), one 5 oz glass of wine (148 mL), or one 1 oz glass of hard liquor (44 mL). ? Keep yourself hydrated with water, diet soda, or unsweetened iced tea.  Keep in mind that regular soda, juice, and other mixers may contain a lot of sugar and must be counted as carbs. What are tips for following this plan? Reading food labels  Start by checking the serving size on the "Nutrition Facts" label of packaged foods and drinks. The amount of calories, carbs, fats, and other nutrients listed on the label is based on one serving of the item. Many items contain more than one serving per package.  Check the total grams (g) of carbs in one serving. You can calculate the number of servings of carbs in one serving by dividing the total carbs by 15. For example, if a food has 30 g of total carbs per serving, it would be equal to 2 servings of carbs.  Check the number of grams (g) of saturated fats and trans fats in one serving. Choose foods that have   a low amount or none of these fats.  Check the number of milligrams (mg) of salt (sodium) in one serving. Most people should limit total sodium intake to less than 2,300 mg per day.  Always check the nutrition information of foods labeled as "low-fat" or "nonfat." These foods may be higher in added sugar or refined carbs and should be avoided.  Talk to your dietitian to identify your daily goals for nutrients listed on the label. Shopping  Avoid buying canned, pre-made, or processed foods. These foods tend to be high in fat, sodium, and added  sugar.  Shop around the outside edge of the grocery store. This is where you will most often find fresh fruits and vegetables, bulk grains, fresh meats, and fresh dairy. Cooking  Use low-heat cooking methods, such as baking, instead of high-heat cooking methods like deep frying.  Cook using healthy oils, such as olive, canola, or sunflower oil.  Avoid cooking with butter, cream, or high-fat meats. Meal planning  Eat meals and snacks regularly, preferably at the same times every day. Avoid going long periods of time without eating.  Eat foods that are high in fiber, such as fresh fruits, vegetables, beans, and whole grains. Talk with your dietitian about how many servings of carbs you can eat at each meal.  Eat 4-6 oz (112-168 g) of lean protein each day, such as lean meat, chicken, fish, eggs, or tofu. One ounce (oz) of lean protein is equal to: ? 1 oz (28 g) of meat, chicken, or fish. ? 1 egg. ?  cup (62 g) of tofu.  Eat some foods each day that contain healthy fats, such as avocado, nuts, seeds, and fish.   What foods should I eat? Fruits Berries. Apples. Oranges. Peaches. Apricots. Plums. Grapes. Mango. Papaya. Pomegranate. Kiwi. Cherries. Vegetables Lettuce. Spinach. Leafy greens, including kale, chard, collard greens, and mustard greens. Beets. Cauliflower. Cabbage. Broccoli. Carrots. Green beans. Tomatoes. Peppers. Onions. Cucumbers. Brussels sprouts. Grains Whole grains, such as whole-wheat or whole-grain bread, crackers, tortillas, cereal, and pasta. Unsweetened oatmeal. Quinoa. Brown or wild rice. Meats and other proteins Seafood. Poultry without skin. Lean cuts of poultry and beef. Tofu. Nuts. Seeds. Dairy Low-fat or fat-free dairy products such as milk, yogurt, and cheese. The items listed above may not be a complete list of foods and beverages you can eat. Contact a dietitian for more information. What foods should I avoid? Fruits Fruits canned with  syrup. Vegetables Canned vegetables. Frozen vegetables with butter or cream sauce. Grains Refined white flour and flour products such as bread, pasta, snack foods, and cereals. Avoid all processed foods. Meats and other proteins Fatty cuts of meat. Poultry with skin. Breaded or fried meats. Processed meat. Avoid saturated fats. Dairy Full-fat yogurt, cheese, or milk. Beverages Sweetened drinks, such as soda or iced tea. The items listed above may not be a complete list of foods and beverages you should avoid. Contact a dietitian for more information. Questions to ask a health care provider  Do I need to meet with a diabetes educator?  Do I need to meet with a dietitian?  What number can I call if I have questions?  When are the best times to check my blood glucose? Where to find more information:  American Diabetes Association: diabetes.org  Academy of Nutrition and Dietetics: www.eatright.org  National Institute of Diabetes and Digestive and Kidney Diseases: www.niddk.nih.gov  Association of Diabetes Care and Education Specialists: www.diabeteseducator.org Summary  It is important to have healthy eating   habits because your blood sugar (glucose) levels are greatly affected by what you eat and drink.  A healthy meal plan will help you control your blood glucose and maintain a healthy lifestyle.  Your health care provider may recommend that you work with a dietitian to make a meal plan that is best for you.  Keep in mind that carbohydrates (carbs) and alcohol have immediate effects on your blood glucose levels. It is important to count carbs and to use alcohol carefully. This information is not intended to replace advice given to you by your health care provider. Make sure you discuss any questions you have with your health care provider. Document Revised: 05/19/2019 Document Reviewed: 05/19/2019 Elsevier Patient Education  2021 Elsevier Inc.  

## 2021-03-27 NOTE — Progress Notes (Signed)
I,[name],acting as a Education administrator for [name], MD.,have documented all relevant documentation on the behalf of [name], MD,as directed by  [name], MD while in the presence of [name], MD.  This visit occurred during the SARS-CoV-2 public health emergency.  Safety protocols were in place, including screening questions prior to the visit, additional usage of staff PPE, and extensive cleaning of exam room while observing appropriate contact time as indicated for disinfecting solutions.  Subjective:     Patient ID: [name] , female    DOB: 1946-04-15 , 75 y.o.   MRN: [id number]   Chief Complaint  Patient presents with  . Diabetes  . Hypertension    HPI  She is here today for a diabetes and BP check. She reports compliance with meds. She denies headaches, chest pain and palpitations. She does not have any other concerns at this time. She is still living with her sister, who is present for the visit.   Diabetes She presents for her follow-up diabetic visit. She has type 2 diabetes mellitus. Her disease course has been improving. There are no hypoglycemic associated symptoms. Pertinent negatives for diabetes include no blurred vision and no chest pain. There are no hypoglycemic complications. Diabetic complications include nephropathy. Risk factors for coronary artery disease include diabetes mellitus, dyslipidemia, hypertension, obesity, post-menopausal and sedentary lifestyle. She is compliant with treatment some of the time. Her weight is fluctuating minimally. She is following a generally healthy diet. She never participates in exercise. Her home blood glucose trend is decreasing steadily. Her breakfast blood glucose is taken between 8-9 am. Her breakfast blood glucose range is generally 130-140 mg/dl. An ACE inhibitor/angiotensin II receptor blocker is being taken. Eye exam is not current.  Hypertension This is a chronic problem. The current episode  started more than 1 year ago. The problem has been gradually improving since onset. The problem is controlled. Pertinent negatives include no blurred vision, chest pain, palpitations or shortness of breath. The current treatment provides moderate improvement. Hypertensive end-organ damage includes kidney disease. Identifiable causes of hypertension include sleep apnea.    Past Medical History:  Diagnosis Date  . Abnormal liver function     in the past.  . Anemia   . Arthritis    all over  . Bruises easily   . Cataract    right eye;immature  . CHF (congestive heart failure) ([place])   . Chronic back pain    stenosis  . Chronic cough   . Chronic kidney disease   . COPD (chronic obstructive pulmonary disease) ([place])   . COVID    aug/sept 2021  . Demand myocardial infarction [facility]) 2012   Demand Infarction in setting of Pancreatitis --> mild Troponin elevation, NON-OBSTRUCTIVE CAD  . Depression    takes Abilify daily as well as Zoloft  . Diastolic heart failure 5747   Grade 1 diastolic Dysfunction by Echo   . Diverticulosis   . DM (diabetes mellitus) ([place])    takes Victoza daily as well as Lantus and Humalog  . Empty sella ([place])    on MRI in 2009.  . Glaucoma   . Headache(784.0)    last migraine-4-62yr ago  . History of blood transfusion    no abnormal reaction noted  . History of colon polyps    benign  . HTN (hypertension)    takes B[employer] Energydaily  . Hyperlipidemia    takes Lipitor daily  . Joint swelling   .  Nocturia   . Obesity hypoventilation syndrome ([place])   . Obstructive sleep apnea 02/2018   Notably improved split-night study with weight loss from 270 (2017) down to 250 pounds (2019).  . Pancreatitis    takes Pancrelipase daily  . Parkinson's disease ([place])    takes Sinemet daily  . Peripheral neuropathy   . Pneumonia 2012  . Seizures ([place])    takes Lamictal daily and Primidone nightly;last seizure 2wks ago  . Urinary urgency    With increased  frequency  . Varicose veins of both lower extremities with pain    With edema.  Takes daily Lasix     Family History  Problem Relation Age of Onset  . Allergies Father   . Heart disease Father        before 17  . Heart failure Father   . Hypertension Father   . Hyperlipidemia Father   . Heart disease Mother   . Diabetes Mother   . Hypertension Mother   . Hyperlipidemia Mother   . Hypertension Sister   . Heart disease Sister        before 58  . Hyperlipidemia Sister   . Diabetes Brother   . Hypertension Brother   . Diabetes Sister   . Hypertension Sister   . Diabetes Son   . Hypertension Son   . Cancer Other      Current Outpatient Medications:  .  Glucagon (GVOKE HYPOPEN 1-PACK) 0.5 MG/0.1ML SOAJ, Inject 0.5 mg into the skin daily as needed., Disp: 0.1 mL, Rfl: 11 .  acetaminophen (TYLENOL) 500 MG tablet, Take 500-1,000 mg by mouth every 4 (four) hours as needed for moderate pain. , Disp: , Rfl:  .  albuterol (VENTOLIN HFA) 108 (90 Base) MCG/ACT inhaler, Inhale 1-2 puffs into the lungs every 6 (six) hours as needed for wheezing or shortness of breath., Disp: , Rfl:  .  Alcohol Swabs (B-D SINGLE USE SWABS REGULAR) PADS, USE TWICE DAILY AS DIRECTED, Disp: 200 each, Rfl: 6 .  Ascorbic Acid (VITAMIN C) 1000 MG tablet, Take 1,000 mg by mouth daily., Disp: , Rfl:  .  atorvastatin (LIPITOR) 10 MG tablet, TAKE 1 TABLET EVERY EVENING., Disp: 90 tablet, Rfl: 2 .  benzonatate (TESSALON PERLES) 100 MG capsule, Take 1 capsule (100 mg total) by mouth 3 (three) times daily as needed for cough., Disp: 30 capsule, Rfl: 1 .  Blood Glucose Calibration (TRUE METRIX LEVEL 1) Low SOLN, Use as directed  Dx:e11.22, Disp: 1 each, Rfl: 2 .  Blood Glucose Monitoring Suppl (TRUE METRIX METER) w/Device KIT, USE AS DIRECTED, Disp: 1 kit, Rfl: 1 .  carvedilol (COREG) 25 MG tablet, Take 0.5 tablets (12.5 mg total) by mouth 2 (two) times daily with a meal., Disp: 180 tablet, Rfl: 2 .  dexlansoprazole  (DEXILANT) 60 MG capsule, Take 1 capsule (60 mg total) by mouth daily., Disp: 90 capsule, Rfl: 2 .  diclofenac sodium (VOLTAREN) 1 % GEL, Apply 2 g topically 4 (four) times daily as needed (pain)., Disp: , Rfl:  .  ferrous sulfate 324 MG TBEC, Take 324 mg by mouth 2 (two) times daily., Disp: , Rfl:  .  furosemide (LASIX) 80 MG tablet, Take 80 mg by mouth daily., Disp: , Rfl:  .  glucose blood (TRUE METRIX BLOOD GLUCOSE TEST) test strip, USE AS DIRECTED TO CHECK BLOOD SUGARS 2 TIMES PER DAY, Disp: 200 strip, Rfl: 2 .  guaiFENesin-dextromethorphan (ROBITUSSIN DM) 100-10 MG/5ML syrup, Take 10 mLs by mouth every 6 (six)  hours as needed for cough., Disp: 118 mL, Rfl: 0 .  hydrALAZINE (APRESOLINE) 100 MG tablet, Take 1 tablet (100 mg total) by mouth 3 (three) times daily., Disp: 270 tablet, Rfl: 3 .  hydrocortisone cream 1 %, Apply 1 application topically daily as needed for itching. , Disp: , Rfl:  .  insulin lispro (HUMALOG KWIKPEN) 100 UNIT/ML KwikPen, Inject 0.02-0.04 mLs (2-4 Units total) into the skin 3 (three) times daily as needed (high blood sugar over 150)., Disp: 15 mL, Rfl: 2 .  Insulin Pen Needle (B-D ULTRAFINE III SHORT PEN) 31G X 8 MM MISC, USE DAILY WITH VICTOZA AND/OR NOVOLOG., Disp: 400 each, Rfl: 2 .  Isopropyl Alcohol (ALCOHOL WIPES) 70 % MISC, Apply 1 each topically 2 (two) times daily., Disp: 300 each, Rfl: 3 .  isosorbide mononitrate (IMDUR) 30 MG 24 hr tablet, TAKE 1 TABLET EVERY DAY (APPOINTMENT IS NEEDED FOR REFILLS), Disp: 90 tablet, Rfl: 2 .  lamoTRIgine (LAMICTAL) 150 MG tablet, Take 1 tablet (150 mg total) by mouth 2 (two) times daily., Disp: 180 tablet, Rfl: 3 .  levETIRAcetam (KEPPRA) 500 MG tablet, Take 1,000 mg by mouth at bedtime., Disp: , Rfl:  .  ondansetron (ZOFRAN) 4 MG tablet, Take 4 mg by mouth every 8 (eight) hours as needed for nausea or vomiting., Disp: , Rfl:  .  OZEMPIC, 1 MG/DOSE, 4 MG/3ML SOPN, INJECT 1 MG INTO THE SKIN ONCE A WEEK., Disp: 9 mL, Rfl: 3 .   Pancrelipase, Lip-Prot-Amyl, 25000-79000 units CPEP, Take 1-2 capsules by mouth See admin instructions. Take 1 capsules in the morning with breakfast, 1 capsule with lunch, and 2 capsules with supper, Disp: , Rfl:  .  potassium chloride SA (KLOR-CON) 20 MEQ tablet, TAKE 2 TABLETS EVERY DAY BUT WHEN TAKING METOLAZONE TAKE 3 TABLETS DAILY AS DIRECTED, Disp: 204 tablet, Rfl: 2 .  sennosides-docusate sodium (SENOKOT-S) 8.6-50 MG tablet, Take 1-2 tablets by mouth at bedtime as needed for constipation., Disp: , Rfl:  .  sertraline (ZOLOFT) 50 MG tablet, TAKE 1 TABLET EVERY DAY, Disp: 90 tablet, Rfl: 2 .  tiotropium (SPIRIVA HANDIHALER) 18 MCG inhalation capsule, Place 1 capsule (18 mcg total) into inhaler and inhale daily., Disp: 90 capsule, Rfl: 3 .  tolnaftate (TINACTIN) 1 % cream, Apply 1 application topically daily as needed (foot fungus). , Disp: , Rfl:  .  TOUJEO SOLOSTAR 300 UNIT/ML Solostar Pen, INJECT 36 UNITS INTO THE SKIN AT BEDTIME., Disp: 13.5 mL, Rfl: 1 .  TRUEplus Lancets 33G MISC, Use as directed to check blood sugars 2 times per day dx: e11.22, Disp: 300 each, Rfl: 2   Allergies  Allergen Reactions  . Other Anaphylaxis and Rash    Bleach  . Penicillins Hives    Did it involve swelling of the face/tongue/throat, SOB, or low BP? No Did it involve sudden or severe rash/hives, skin peeling, or any reaction on the inside of your mouth or nose? Yes Did you need to seek medical attention at a hospital or doctor's office? Yes When did it last happen?      Childhood allergy  If all above answers are "NO", may proceed with cephalosporin use.    . Sulfa Antibiotics Hives  . Aspirin Other (See Comments)     On aspirin 81 mg - Rectal bleeding in dec 2018  . Codeine     Headache and makes the patient feel "off"     Review of Systems  Constitutional: Negative.   Eyes:  Negative for blurred  vision.  Respiratory: Negative.  Negative for shortness of breath.   Cardiovascular: Negative.   Negative for chest pain and palpitations.  Gastrointestinal: Negative.   Neurological: Negative.   Psychiatric/Behavioral: Negative.      Today's Vitals   03/27/21 1431  BP: 128/70  Pulse: 70  Temp: 98.5 F (36.9 C)  TempSrc: Oral  Weight: 194 lb 3.2 oz (88.1 kg)  Height: 4' 11.2" (1.504 m)   Body mass index is 38.96 kg/m.  Wt Readings from Last 3 Encounters:  05/04/21 192 lb 3.2 oz (87.2 kg)  04/10/21 192 lb (87.1 kg)  04/03/21 193 lb 5.5 oz (87.7 kg)    Objective:  Physical Exam Vitals and nursing note reviewed.  Constitutional:      Appearance: Normal appearance.  HENT:     Head: Normocephalic and atraumatic.     Nose:     Comments: Masked     Mouth/Throat:     Comments: Masked  Eyes:     Extraocular Movements: Extraocular movements intact.  Cardiovascular:     Rate and Rhythm: Normal rate and regular rhythm.     Heart sounds: Normal heart sounds.  Pulmonary:     Effort: Pulmonary effort is normal.     Breath sounds: Normal breath sounds.  Abdominal:     General: Bowel sounds are normal.     Palpations: Abdomen is soft.     Hernia: A hernia is present.  Skin:    General: Skin is warm.  Neurological:     General: No focal deficit present.     Mental Status: She is alert.  Psychiatric:        Mood and Affect: Mood normal.        Behavior: Behavior normal.        Assessment And Plan:     1. Type 2 diabetes mellitus with stage 2 chronic kidney disease, with long-term current use of insulin (HCC) Comments: Chronic, I will check labs as listed below. I will adjust meds as needed. She will need to start Fiasp b/c her insurance will no longer pay for Humalog Kwikpen. - BMP8+eGFR - Hemoglobin A1c  2. Hypertensive heart disease with chronic diastolic congestive heart failure ([place]) Comments: Chronic, well controlled. I will check renal function. No med changes.   3. Atherosclerotic heart disease of native coronary artery with other forms of angina pectoris  [facility]) Comments: She is encouraged to follow heart healthy lifestyle. She is also encouraged to take statin therapy as prescribed.   4. Rheumatoid arthritis involving both feet, unspecified whether rheumatoid factor present ([place]) Comments: Chronic, as per Dr. Cannon Kettle. She is encouraged to follow anti-inflammatory diet.   5. Supraumbilical hernia Comments: She is now having some pain - with coughing. I will schedule her for CT abd/pelvis and refer her to General Surgery for further evaluation.  6. Memory loss Comments: I will check labs as listed below.  I will consider Neuro referral once labs are reviewed.  - TSH - RPR - Vitamin B12  7. Class 2 severe obesity due to excess calories with serious comorbidity and body mass index (BMI) of 38.0 to 38.9 in adult [facility]) Comments: She is encouraged to strive for BMI less than 30 to decrease cardaic risk. Advised to gradually increase her daily activity as tolerated.   8. Need for influenza vaccination Comments: She was given high dose flu vaccine.  - Flu Vaccine QUAD High Dose(Fluad)  Patient was given opportunity to ask questions. Patient verbalized understanding of the  plan and was able to repeat key elements of the plan. All questions were answered to their satisfaction.   I, [name], MD, have reviewed all documentation for this visit. The documentation on 03/27/21 for the exam, diagnosis, procedures, and orders are all accurate and complete.   IF YOU HAVE BEEN REFERRED TO A SPECIALIST, IT MAY TAKE 1-2 WEEKS TO SCHEDULE/PROCESS THE REFERRAL. IF YOU HAVE NOT HEARD FROM US/SPECIALIST IN TWO WEEKS, PLEASE GIVE [place] A CALL AT [phone] X [phone].   THE PATIENT IS ENCOURAGED TO PRACTICE SOCIAL DISTANCING DUE TO THE COVID-19 PANDEMIC.

## 2021-03-28 LAB — BMP8+EGFR
BUN/Creatinine Ratio: 20 (ref 12–28)
BUN: 16 mg/dL (ref 8–27)
CO2: 26 mmol/L (ref 20–29)
Calcium: 9.6 mg/dL (ref 8.7–10.3)
Chloride: 96 mmol/L (ref 96–106)
Creatinine, Ser: 0.82 mg/dL (ref 0.57–1.00)
Glucose: 85 mg/dL (ref 70–99)
Potassium: 3.9 mmol/L (ref 3.5–5.2)
Sodium: 137 mmol/L (ref 134–144)
eGFR: 76 mL/min/{1.73_m2} (ref >59)

## 2021-03-28 LAB — RPR: RPR Ser Ql: NONREACTIVE

## 2021-03-28 LAB — HEMOGLOBIN A1C
Est. average glucose Bld gHb Est-mCnc: 163 mg/dL
Hgb A1c MFr Bld: 7.3 % — ABNORMAL HIGH (ref 4.8–5.6)

## 2021-03-28 LAB — TSH: TSH: 2.01 u[IU]/mL (ref 0.450–4.500)

## 2021-03-28 LAB — VITAMIN B12: Vitamin B-12: 525 pg/mL (ref 232–1245)

## 2021-03-30 ENCOUNTER — Ambulatory Visit (INDEPENDENT_AMBULATORY_CARE_PROVIDER_SITE_OTHER): Payer: Medicare HMO

## 2021-03-30 DIAGNOSIS — N182 Chronic kidney disease, stage 2 (mild): Secondary | ICD-10-CM

## 2021-03-30 DIAGNOSIS — I110 Hypertensive heart disease with heart failure: Secondary | ICD-10-CM

## 2021-03-30 NOTE — Patient Instructions (Signed)
Social Worker Visit Information  Goals we discussed today:   Goals Addressed             This Visit's Progress    COMPLETED: Work with SW to manage care coordination needs       Timeframe:  Long-Range Goal Priority:  [employer] Start Date: 2.3.22                            Expected End Date: 6.3.22                        Patient Goals/Self-Care Activities patient will:   - Follow up with primary care provider as needed -Schedule visit with gastroenterologist to address concern for rectal bleeding         Patient verbalizes understanding of instructions provided today and agrees to view in [place].   Follow Up Plan:  No SW follow up planned at this time. Please contact me as needed.  Daneen Schick, BSW, CDP Social Worker, Certified Dementia Practitioner [place] [facility] [phone]

## 2021-03-30 NOTE — Chronic Care Management (AMB) (Signed)
Chronic Care Management    Social Work Note  03/30/2021 Name: [name] MRN: [id number] DOB: [birth year]  [name] is a 75 y.o. year old female who is a primary care patient of [name], MD. The CCM team was consulted to assist the patient with chronic disease management and/or care coordination needs related to:  DM II and CHF .   Engaged with patient by telephone for follow up visit in response to provider referral for social work chronic care management and care coordination services.   Consent to Services:  The patient was given information about Chronic Care Management services, agreed to services, and gave verbal consent prior to initiation of services.  Please see initial visit note for detailed documentation.   Patient agreed to services and consent obtained.   Assessment: Review of patient past medical history, allergies, medications, and health status, including review of relevant consultants reports was performed today as part of a comprehensive evaluation and provision of chronic care management and care coordination services.     SDOH (Social Determinants of Health) assessments and interventions performed:    Advanced Directives Status: Not addressed in this encounter.  CCM Care Plan  Allergies  Allergen Reactions   Other Anaphylaxis and Rash    Bleach   Penicillins Hives    Did it involve swelling of the face/tongue/throat, SOB, or low BP? No Did it involve sudden or severe rash/hives, skin peeling, or any reaction on the inside of your mouth or nose? Yes Did you need to seek medical attention at a hospital or doctor's office? Yes When did it last happen?      Childhood allergy  If all above answers are "NO", may proceed with cephalosporin use.     Sulfa Antibiotics Hives   Aspirin Other (See Comments)     On aspirin 81 mg - Rectal bleeding in dec 2018   Codeine     Headache and makes the patient feel "off"    Outpatient Encounter Medications  as of 03/30/2021  Medication Sig Note   acetaminophen (TYLENOL) 500 MG tablet Take 500-1,000 mg by mouth every 4 (four) hours as needed for moderate pain.     albuterol (VENTOLIN HFA) 108 (90 Base) MCG/ACT inhaler Inhale 1-2 puffs into the lungs every 6 (six) hours as needed for wheezing or shortness of breath. (Patient not taking: Reported on 02/06/2021)    Alcohol Swabs (B-D SINGLE USE SWABS REGULAR) PADS USE TWICE DAILY AS DIRECTED    amLODipine (NORVASC) 5 MG tablet Take 1 tablet (5 mg total) by mouth daily.    Ascorbic Acid (VITAMIN C) 1000 MG tablet Take 1,000 mg by mouth daily.    atorvastatin (LIPITOR) 10 MG tablet TAKE 1 TABLET EVERY EVENING.    B-D ULTRAFINE III SHORT PEN 31G X 8 MM MISC USE DAILY WITH VICTOZA AND/OR NOVOLOG.    benzonatate (TESSALON PERLES) 100 MG capsule Take 1 capsule (100 mg total) by mouth 3 (three) times daily as needed for cough. 02/06/2021: As needed    Blood Glucose Calibration (TRUE METRIX LEVEL 1) Low SOLN Use as directed  Dx:e11.22    Blood Glucose Monitoring Suppl (TRUE METRIX METER) w/Device KIT Use as directed to check blood sugars 2 times per day dx: e11.22    carvedilol (COREG) 25 MG tablet TAKE 1 TABLET TWICE DAILY WITH A MEAL    dexlansoprazole (DEXILANT) 60 MG capsule Take 1 capsule (60 mg total) by mouth daily.    diclofenac sodium (VOLTAREN) 1 %  GEL Apply 2 g topically 4 (four) times daily as needed (pain).    ferrous sulfate 324 MG TBEC Take 324 mg by mouth 2 (two) times daily.    furosemide (LASIX) 80 MG tablet TAKE 1 TABLET DAILY ON MONDAY THROUGH FRIDAY, AND 1/2 TABLET ON SATURDAY AND SUNDAY    Glucagon (GVOKE HYPOPEN 1-PACK) 0.5 MG/0.1ML SOAJ Inject 0.5 mg into the skin daily as needed.    glucose blood (TRUE METRIX BLOOD GLUCOSE TEST) test strip USE AS DIRECTED TO CHECK BLOOD SUGARS 2 TIMES PER DAY    guaiFENesin-dextromethorphan (ROBITUSSIN DM) 100-10 MG/5ML syrup Take 10 mLs by mouth every 6 (six) hours as needed for cough. (Patient not  taking: Reported on 02/06/2021)    hydrALAZINE (APRESOLINE) 100 MG tablet Take 1 tablet (100 mg total) by mouth 3 (three) times daily.    hydrocortisone cream 1 % Apply 1 application topically daily as needed for itching.     insulin lispro (HUMALOG KWIKPEN) 100 UNIT/ML KwikPen Inject 0.02-0.04 mLs (2-4 Units total) into the skin 3 (three) times daily as needed (high blood sugar over 150).    Isopropyl Alcohol (ALCOHOL WIPES) 70 % MISC Apply 1 each topically 2 (two) times daily.    isosorbide mononitrate (IMDUR) 30 MG 24 hr tablet TAKE 1 TABLET EVERY DAY (APPOINTMENT IS NEEDED FOR REFILLS)    lamoTRIgine (LAMICTAL) 150 MG tablet Take 1 tablet (150 mg total) by mouth 2 (two) times daily.    levETIRAcetam (KEPPRA XR) 500 MG 24 hr tablet Take 3 tablets every night    ondansetron (ZOFRAN) 4 MG tablet Take 4 mg by mouth every 8 (eight) hours as needed for nausea or vomiting. 02/06/2021: As needed   OZEMPIC, 1 MG/DOSE, 4 MG/3ML SOPN INJECT 1 MG INTO THE SKIN ONCE A WEEK.    Pancrelipase, Lip-Prot-Amyl, 25000-79000 units CPEP Take 1-2 capsules by mouth See admin instructions. Take 1 capsules in the morning with breakfast, 1 capsule with lunch, and 2 capsules with supper    potassium chloride SA (KLOR-CON) 20 MEQ tablet TAKE 2 TABLETS EVERY DAY BUT WHEN TAKING METOLAZONE TAKE 3 TABLETS DAILY AS DIRECTED (Patient taking differently: Take 40 mEq by mouth daily.)    sennosides-docusate sodium (SENOKOT-S) 8.6-50 MG tablet Take 1 tablet by mouth daily.    sertraline (ZOLOFT) 50 MG tablet TAKE 1 TABLET EVERY DAY    tiotropium (SPIRIVA HANDIHALER) 18 MCG inhalation capsule Place 1 capsule (18 mcg total) into inhaler and inhale daily.    tolnaftate (TINACTIN) 1 % cream Apply 1 application topically daily as needed (foot fungus).     TOUJEO SOLOSTAR 300 UNIT/ML Solostar Pen Inject 25 Units into the skin at bedtime.    TRUEplus Lancets 33G MISC Use as directed to check blood sugars 2 times per day dx: e11.22    No  facility-administered encounter medications on file as of 03/30/2021.    Patient Active Problem List   Diagnosis Date Noted   SOB (shortness of breath) 11/14/2020   Postinflammatory pulmonary fibrosis ([place]) 06/08/2020   Lung nodule 06/08/2020   Chronic kidney disease, stage 2 (mild) 01/05/2020   Hypertensive chronic kidney disease with stage 1 through stage 4 chronic kidney disease, or unspecified chronic kidney disease 01/05/2020   Snoring 01/05/2020   Neuroleptic-induced parkinsonism ([place]) 09/28/2019   Class 3 severe obesity due to excess calories with serious comorbidity and body mass index (BMI) of 40.0 to 44.9 in adult [facility]) 05/22/2019   Pulmonary hypertension, unspecified ([place]) 04/06/2019   Syncope 02/24/2019  Rheumatoid arthritis ([place]) 01/21/2019   Radial styloid tenosynovitis 11/24/2018   Type 2 diabetes mellitus with stage 2 chronic kidney disease, with long-term current use of insulin ([place]) 07/21/2018   Chest pain 05/15/2018   Excessive daytime sleepiness 01/30/2018   Sleep-related hypoventilation 01/30/2018   Recurrent depression ([place]) 11/06/2017   OSA on CPAP 10/07/2017   Todd's paralysis (HCC)    Chronic diastolic CHF (congestive heart failure), NYHA class 2 (HCC)    Chronic respiratory failure with hypoxia (HCC)    Acute lower GI bleeding    Stroke-like symptoms 06/15/2017   Rectal bleeding 06/15/2017   Dehydration 03/07/2017   Medication management 12/24/2016   Near syncope 03/09/2016   Racing heart beat 03/09/2016   Dyspnea 01/28/2016   Spinal stenosis at L4-L5 level 10/04/2014   Parkinsonism ([place]) 06/24/2013   Lower abdominal pain 10/31/2012   Type II diabetes mellitus, uncontrolled 10/31/2012   Seizure disorder ([place]) 10/31/2012   Diverticulosis 10/31/2012   Anemia associated with acute blood loss 08/14/2012   Hematochezia 08/14/2012   Varicose veins of bilateral lower extremities with other complications 22/07/5425   Leg pain 03/21/2012   Radicular pain  of left lower extremity 03/21/2012   Heart murmur 11/21/2011   Infected pseudocyst of pancreas 10/28/2011   Chronic pancreatitis ([place]) 10/28/2011   Hypertensive heart disease with chronic diastolic congestive heart failure ([place]) 10/28/2011   Dyslipidemia, goal LDL below 100 10/28/2011   Morbid obesity with BMI of 45.0-49.9, adult ([place]) 10/28/2011   Pseudoseizure 10/24/2011   Gastroesophageal reflux 10/23/2011   History of tobacco abuse 10/23/2011   Hypoxemia 10/23/2011   Disturbance in sleep behavior 09/03/2011   Partial epilepsy with impairment of consciousness ([place]) 09/03/2011   Tremor 09/03/2011   Peripheral edema 04/14/2011   Obesity hypoventilation syndrome ([place]) 10/26/2010   Atherosclerotic heart disease of native coronary artery with other forms of angina pectoris ([place]) 08/28/2010   Acute, but ill-defined, cerebrovascular disease 05/04/2008   Arthropathy 05/04/2008   Disequilibrium 05/04/2008   Generalized tonic clonic epilepsy ([place]) 05/04/2008   Increased frequency of urination 05/04/2008   Insomnia, unspecified 05/04/2008   Major depressive disorder, single episode, unspecified 05/04/2008   Memory loss 05/04/2008   Nausea 05/04/2008   Other malaise and fatigue 05/04/2008   Swelling of limb 05/04/2008    Conditions to be addressed/monitored: CHF and DMII  Care Plan : [place]  Updates made by Daneen Schick since 03/30/2021 12:00 AM  Completed 03/30/2021   Problem: Care Coordination Resolved 03/30/2021     Long-Range Goal: Collaborate with RN Care Manager to assist with care coordination needs Completed 03/30/2021  Start Date: 07/28/2020  Expected End Date: 11/25/2020  Recent Progress: On track  Priority: Medium  Note:   Current Barriers:  Family and relationship dysfunction Social Isolation Chronic conditions including DM II and CHF which put patient at increased risk of hospitalization  Social Work Clinical Goal(s):  Over the next 120 days,  patient will work with SW to address concerns related to care coordination  Interventions: 1:1 collaboration with [name], MD regarding development and update of comprehensive plan of care as evidenced by provider attestation and co-signature Inter-disciplinary care team collaboration (see longitudinal plan of care) Successful outbound call placed to the patient to assess for care coordination needs Discussed the patient is currently experiencing some rectal bleeding but has contacted her gastroenterologist office and is awaiting a return call Encouraged the patient to follow up with her primary provider if needed Assessed for SW  needs - no acute challenges noted at this time Advised the patient of SW plan to sign off on patient case considering there are no acute SW challenges Encouraged the patient to contact the care management team as needed with future care coordination needs Collaboration with Dr Baird Cancer to report rectal bleeding as well as to advise of SW closure  Patient Goals/Self-Care Activities patient will:   - Follow up with primary care provider as needed -Schedule visit with gastroenterologist to address concern for rectal bleeding        Follow Up Plan:  No SW follow up planned at this time. The Care Management team is available to assist with future care coordination needs.      Daneen Schick, BSW, CDP Social Worker, Certified Dementia Practitioner [place] [facility] [place] [facility] [phone]

## 2021-03-31 ENCOUNTER — Encounter (HOSPITAL_COMMUNITY): Payer: Self-pay | Admitting: Internal Medicine

## 2021-03-31 ENCOUNTER — Observation Stay (HOSPITAL_COMMUNITY)
Admission: EM | Admit: 2021-03-31 | Discharge: 2021-04-03 | Disposition: A | Discharge status: Home / Self Care | Payer: Medicare HMO | Attending: Internal Medicine | Admitting: Internal Medicine

## 2021-03-31 ENCOUNTER — Emergency Department (HOSPITAL_COMMUNITY): Payer: Medicare HMO

## 2021-03-31 ENCOUNTER — Other Ambulatory Visit: Payer: Self-pay

## 2021-03-31 DIAGNOSIS — Z20822 Contact with and (suspected) exposure to covid-19: Secondary | ICD-10-CM | POA: Diagnosis not present

## 2021-03-31 DIAGNOSIS — R569 Unspecified convulsions: Secondary | ICD-10-CM

## 2021-03-31 DIAGNOSIS — Z87891 Personal history of nicotine dependence: Secondary | ICD-10-CM | POA: Diagnosis not present

## 2021-03-31 DIAGNOSIS — E876 Hypokalemia: Secondary | ICD-10-CM | POA: Diagnosis present

## 2021-03-31 DIAGNOSIS — D631 Anemia in chronic kidney disease: Secondary | ICD-10-CM | POA: Diagnosis not present

## 2021-03-31 DIAGNOSIS — N189 Chronic kidney disease, unspecified: Secondary | ICD-10-CM | POA: Diagnosis not present

## 2021-03-31 DIAGNOSIS — D62 Acute posthemorrhagic anemia: Secondary | ICD-10-CM | POA: Insufficient documentation

## 2021-03-31 DIAGNOSIS — E119 Type 2 diabetes mellitus without complications: Secondary | ICD-10-CM

## 2021-03-31 DIAGNOSIS — G20 Parkinson's disease: Secondary | ICD-10-CM | POA: Insufficient documentation

## 2021-03-31 DIAGNOSIS — Z79899 Other long term (current) drug therapy: Secondary | ICD-10-CM | POA: Diagnosis not present

## 2021-03-31 DIAGNOSIS — D124 Benign neoplasm of descending colon: Principal | ICD-10-CM | POA: Insufficient documentation

## 2021-03-31 DIAGNOSIS — Z043 Encounter for examination and observation following other accident: Secondary | ICD-10-CM | POA: Diagnosis not present

## 2021-03-31 DIAGNOSIS — I130 Hypertensive heart and chronic kidney disease with heart failure and stage 1 through stage 4 chronic kidney disease, or unspecified chronic kidney disease: Secondary | ICD-10-CM | POA: Diagnosis not present

## 2021-03-31 DIAGNOSIS — Z8616 Personal history of COVID-19: Secondary | ICD-10-CM | POA: Insufficient documentation

## 2021-03-31 DIAGNOSIS — Z7984 Long term (current) use of oral hypoglycemic drugs: Secondary | ICD-10-CM | POA: Insufficient documentation

## 2021-03-31 DIAGNOSIS — Z794 Long term (current) use of insulin: Secondary | ICD-10-CM | POA: Insufficient documentation

## 2021-03-31 DIAGNOSIS — J439 Emphysema, unspecified: Secondary | ICD-10-CM | POA: Insufficient documentation

## 2021-03-31 DIAGNOSIS — R404 Transient alteration of awareness: Secondary | ICD-10-CM | POA: Diagnosis not present

## 2021-03-31 DIAGNOSIS — J449 Chronic obstructive pulmonary disease, unspecified: Secondary | ICD-10-CM | POA: Diagnosis not present

## 2021-03-31 DIAGNOSIS — M47812 Spondylosis without myelopathy or radiculopathy, cervical region: Secondary | ICD-10-CM | POA: Diagnosis not present

## 2021-03-31 DIAGNOSIS — K5730 Diverticulosis of large intestine without perforation or abscess without bleeding: Secondary | ICD-10-CM | POA: Diagnosis not present

## 2021-03-31 DIAGNOSIS — E1122 Type 2 diabetes mellitus with diabetic chronic kidney disease: Secondary | ICD-10-CM | POA: Diagnosis not present

## 2021-03-31 DIAGNOSIS — G40409 Other generalized epilepsy and epileptic syndromes, not intractable, without status epilepticus: Secondary | ICD-10-CM | POA: Diagnosis not present

## 2021-03-31 DIAGNOSIS — I959 Hypotension, unspecified: Secondary | ICD-10-CM | POA: Diagnosis not present

## 2021-03-31 DIAGNOSIS — E785 Hyperlipidemia, unspecified: Secondary | ICD-10-CM | POA: Diagnosis not present

## 2021-03-31 DIAGNOSIS — N183 Chronic kidney disease, stage 3 unspecified: Secondary | ICD-10-CM

## 2021-03-31 DIAGNOSIS — K921 Melena: Secondary | ICD-10-CM | POA: Diagnosis present

## 2021-03-31 DIAGNOSIS — K922 Gastrointestinal hemorrhage, unspecified: Secondary | ICD-10-CM | POA: Diagnosis not present

## 2021-03-31 DIAGNOSIS — I5032 Chronic diastolic (congestive) heart failure: Secondary | ICD-10-CM | POA: Diagnosis present

## 2021-03-31 DIAGNOSIS — R55 Syncope and collapse: Secondary | ICD-10-CM | POA: Diagnosis not present

## 2021-03-31 DIAGNOSIS — D649 Anemia, unspecified: Secondary | ICD-10-CM | POA: Diagnosis present

## 2021-03-31 DIAGNOSIS — I10 Essential (primary) hypertension: Secondary | ICD-10-CM | POA: Diagnosis not present

## 2021-03-31 LAB — CBC WITH DIFFERENTIAL/PLATELET
Abs Immature Granulocytes: 0.01 10*3/uL (ref 0.00–0.07)
Basophils Absolute: 0 10*3/uL (ref 0.0–0.1)
Basophils Relative: 0 %
Eosinophils Absolute: 0.2 10*3/uL (ref 0.0–0.5)
Eosinophils Relative: 4 %
HCT: 16.6 % — ABNORMAL LOW (ref 36.0–46.0)
Hemoglobin: 5.1 g/dL — CL (ref 12.0–15.0)
Immature Granulocytes: 0 %
Lymphocytes Relative: 33 %
Lymphs Abs: 1.8 10*3/uL (ref 0.7–4.0)
MCH: 24 pg — ABNORMAL LOW (ref 26.0–34.0)
MCHC: 31.3 g/dL (ref 30.0–36.0)
MCV: 76.5 fL — ABNORMAL LOW (ref 80.0–100.0)
Monocytes Absolute: 0.5 10*3/uL (ref 0.1–1.0)
Monocytes Relative: 10 %
Neutro Abs: 2.8 10*3/uL (ref 1.7–7.7)
Neutrophils Relative %: 53 %
Platelets: 262 10*3/uL (ref 150–400)
RBC: 2.17 MIL/uL — ABNORMAL LOW (ref 3.87–5.11)
RDW: 16 % — ABNORMAL HIGH (ref 11.5–15.5)
WBC: 5.3 10*3/uL (ref 4.0–10.5)
nRBC: 0 % (ref 0.0–0.2)

## 2021-03-31 LAB — COMPREHENSIVE METABOLIC PANEL
ALT: 14 U/L (ref 0–44)
AST: 20 U/L (ref 15–41)
Albumin: 3.2 g/dL — ABNORMAL LOW (ref 3.5–5.0)
Alkaline Phosphatase: 102 U/L (ref 38–126)
Anion gap: 11 (ref 5–15)
BUN: 14 mg/dL (ref 8–23)
CO2: 25 mmol/L (ref 22–32)
Calcium: 8.6 mg/dL — ABNORMAL LOW (ref 8.9–10.3)
Chloride: 97 mmol/L — ABNORMAL LOW (ref 98–111)
Creatinine, Ser: 0.88 mg/dL (ref 0.44–1.00)
GFR, Estimated: >60 mL/min (ref >60)
Glucose, Bld: 185 mg/dL — ABNORMAL HIGH (ref 70–99)
Potassium: 3.3 mmol/L — ABNORMAL LOW (ref 3.5–5.1)
Sodium: 133 mmol/L — ABNORMAL LOW (ref 135–145)
Total Bilirubin: 0.2 mg/dL — ABNORMAL LOW (ref 0.3–1.2)
Total Protein: 6.6 g/dL (ref 6.5–8.1)

## 2021-03-31 LAB — URINALYSIS, ROUTINE W REFLEX MICROSCOPIC
Bilirubin Urine: NEGATIVE
Glucose, UA: NEGATIVE mg/dL
Hgb urine dipstick: NEGATIVE
Ketones, ur: NEGATIVE mg/dL
Leukocytes,Ua: NEGATIVE
Nitrite: NEGATIVE
Protein, ur: NEGATIVE mg/dL
Specific Gravity, Urine: 1.004 — ABNORMAL LOW (ref 1.005–1.030)
pH: 5 (ref 5.0–8.0)

## 2021-03-31 LAB — PREPARE RBC (CROSSMATCH)

## 2021-03-31 LAB — POC OCCULT BLOOD, ED: Fecal Occult Bld: POSITIVE — AB

## 2021-03-31 MED ORDER — INSULIN GLARGINE-YFGN 100 UNIT/ML ~~LOC~~ SOLN
6.0000 [IU] | Freq: Every day | SUBCUTANEOUS | Status: DC
  Administered 2021-04-01 – 2021-04-02 (×3): 6 [IU] via SUBCUTANEOUS
  Filled 2021-03-31 (×4): qty 0.06

## 2021-03-31 MED ORDER — TIOTROPIUM BROMIDE MONOHYDRATE 18 MCG IN CAPS: 18.0000 ug | ORAL_CAPSULE | Freq: Every day | RESPIRATORY_TRACT | Status: DC

## 2021-03-31 MED ORDER — INSULIN ASPART 100 UNIT/ML IJ SOLN: 0.0000 [IU] | Freq: Four times a day (QID) | INTRAMUSCULAR | Status: DC

## 2021-03-31 MED ORDER — ACETAMINOPHEN 325 MG PO TABS
650.0000 mg | ORAL_TABLET | Freq: Four times a day (QID) | ORAL | Status: DC | PRN
  Administered 2021-04-01: 650 mg via ORAL
  Filled 2021-03-31: qty 2

## 2021-03-31 MED ORDER — ACETAMINOPHEN 650 MG RE SUPP: 650.0000 mg | Freq: Four times a day (QID) | RECTAL | Status: DC | PRN

## 2021-03-31 MED ORDER — UMECLIDINIUM BROMIDE 62.5 MCG/INH IN AEPB
1.0000 | INHALATION_SPRAY | Freq: Every day | RESPIRATORY_TRACT | Status: DC
  Administered 2021-04-01: 1 via RESPIRATORY_TRACT
  Filled 2021-03-31: qty 7

## 2021-03-31 MED ORDER — SODIUM CHLORIDE 0.9 % IV SOLN
750.0000 mg | Freq: Two times a day (BID) | INTRAVENOUS | Status: DC
  Administered 2021-04-01 (×2): 750 mg via INTRAVENOUS
  Filled 2021-03-31 (×3): qty 7.5

## 2021-03-31 MED ORDER — SODIUM CHLORIDE 0.9 % IV SOLN
10.0000 mL/h | Freq: Once | INTRAVENOUS | Status: AC
  Administered 2021-03-31: 10 mL/h via INTRAVENOUS

## 2021-03-31 MED ORDER — LORAZEPAM 2 MG/ML IJ SOLN: 1.0000 mg | INTRAMUSCULAR | Status: DC | PRN

## 2021-03-31 MED ORDER — PANTOPRAZOLE SODIUM 40 MG IV SOLR
40.0000 mg | INTRAVENOUS | Status: DC
  Administered 2021-04-01 – 2021-04-02 (×3): 40 mg via INTRAVENOUS
  Filled 2021-03-31 (×3): qty 40

## 2021-03-31 MED ORDER — POTASSIUM CHLORIDE 10 MEQ/100ML IV SOLN
10.0000 meq | INTRAVENOUS | Status: DC
Start: 2021-04-01 — End: 2021-04-01

## 2021-03-31 NOTE — ED Triage Notes (Signed)
Pt bib ems from home c/o seizure like activity. Pt was sitting at table with family. Pt stated she needed a moment then proceeded to slump over the table which led her sliding to the floor. Pt is not on thinners. She has hx of seizures. Last known seizure was 8 months ago. It was stated pt is compliant with medication. EMS witnessed two seizure like activity upper extremities that lasted 15 seconds apiece   BP133/78 CBG 180 HR 70 NSR RR18 O2 100% Non-rebreather

## 2021-03-31 NOTE — ED Notes (Signed)
Contacted pt sister per pt request to update on care. Pt stated sister is healthcare poa.

## 2021-03-31 NOTE — ED Notes (Signed)
Pt stated she has been having rectal bleeding for the past two days. Her PCP recommended she come get checked out at the ED.

## 2021-03-31 NOTE — H&P (Signed)
History and Physical    PLEASE NOTE THAT DRAGON DICTATION SOFTWARE WAS USED IN THE CONSTRUCTION OF THIS NOTE.   [name] BOF:[id number] DOB: 05-06-1946 DOA: 03/31/2021  PCP: [name] Chard, MD Patient coming from: home   I have personally briefly reviewed patient's old medical records in [place]  Chief Complaint: loss of consciousness   HPI: [name] is a 75 y.o. female with medical history significant for chronic iron deficiency anemia with baseline hemoglobin 9-12, diverticulosis, chronic diastolic heart failure, COPD, type 2 diabetes mellitus, seizure disorder, who is admitted to [facility] on 03/31/2021 with acute lower gastrointestinal bleed after presenting from home to [facility] ED complaining of loss of consciousness.   The patient reports that she had just finished eating dinner when she stood up from a seated to a standing position to walk her plate food to the sink, when she developed dizziness, lightheadedness, in the subjective sensation of impending loss of consciousness, followed by loss of consciousness, in which she fell to the floor.  This episode was witnessed by the patient's family, who the patient remained unconscious for less than 10 to 15 seconds, and that the patient did not appear to hit her head as a component of this fall.  Not associated with any overt generalized tonic-clonic activity nor any tongue biting or any loss of bowel/bladder function.  No evidence of significant confusion or diminished responsiveness after the patient regained consciousness.  Patient was subsequently brought to [facility] emergency department via EMS for further evaluation of this episode of loss of consciousness.  The patient denies any associated acute focal weakness, acute focal numbness, paresthesias, facial droop, slurred speech, expressive aphasia, acute change in vision, dysphagia, vertigo.  Denies any associated or ensuing headache or neck pain. Denies any  additional resultant acute arthralgias or myalgias.   As it relates to syncope, the patient denies any recent, immediately preceding, or ensuing chest pain, sob, palpitations, diaphoresis, nausea, vomiting dizziness.  Denies any recent subjective fever, chills, rigors, or generalized myalgias.  No recent neck stiffness, rhinitis, rhinorrhea, sore throat, wheezing, cough, abdominal pain, or rash.  No recent traveling or known COVID-19 exposures. No recent worsening of peripheral edema, nor any calf tenderness, or new lower extremity erythema.  Denies any recent hemoptysis.  Not associated with any recent dysuria, gross hematuria, or change in urinary urgency/frequency.  Chest history of chronic diastolic heart failure, with Most recent echocardiogram occurred in May 2022 was notable for LVEF 60 to 65%, mild LVH, no evidence of focal wall motion normalities, grade 1 diastolic dysfunction, severely dilated left atrium, and no evidence of significant valvular pathology.  The patient conveys that this episode of loss of consciousness was preceded by 2 days of new onset bright red blood per rectum, with a total of 4 such episodes occurring over that 48-hour period.  Denies any associated abdominal pain, nausea, vomiting, or hematemesis.  Aside from the above episode of loss of consciousness, she denies any recent preceding trauma.  Not associate with any diarrhea.  She reports a prior history of acute lower gastrointestinal bleed in the setting of diverticulosis, with most recent prior colonoscopies performed in February 2014 as well as December 2018.  The patient does not recall having previously underwent EGD.  She follows with Dr. Paulita [name] of [facility] as her outpatient gastroenterologist.  Denies any consumption of alcohol or known liver disease. reports a history of GERD, for which she is on daily Dexilant.  Denies  any use of blood thinners as an outpatient, including no aspirin.  Denies any routine use of  NSAIDs.   Of note, medical history also notable for history of seizures, for which she is on Lamictal and Keppra as an outpatient, without any recent reported dose adjustments.  Denies any use of recreational drugs.   Per chart review, in the context of history of chronic iron deficiency anemia, baseline hemoglobin appears to be 9-12, with most recent prior hemoglobin value of 11.8 on 12/23/2020.  She is on daily oral iron supplementation at home.    ED Course:  Vital signs in the ED were notable for the following: Afebrile; heart rate 65-81; blood pressure 117/52 -122/56; respiratory rate 15-20, oxygen saturation 95 to 100% on room air.  Labs were notable for the following: CMP notable for the following: Sodium 133, which corrects to approximately 134.5 when taking into account concomitant hyperglycemia, potassium 3.3, bicarbonate 25, BUN 14 compared to most recent prior value of 16 on 03/27/2021, creatinine 0.88 compared to 0.82 on 04/23/2021, glucose 185, and liver enzymes were found to be within normal limits.  Urinalysis showed no white blood cells, leukocyte Estrace negative, nitrate negative.  CBC notable for white blood cell count 5300, hemoglobin 5.1 associated with microcytic and normochromic findings as well as RDW of 16, platelets 262.  Type and screen completed in the ED this evening.  DRE performed by EDP was noted to be fecal occult positive.  Screening COVID-19/influenza PCR performed in the ED this evening, with results currently pending.  Imaging and additional notable ED work-up: EKG, in comparison to most recent prior from 12/23/2020, showed sinus rhythm with heart rate 66, T wave inversion in V1/V2 of which T wave inversion V1 is unchanged relative to most recent prior, and no evidence of ST changes, including no evidence of ST elevation.  Noncontrast CT head showed no evidence of acute intracranial process, including no evidence of intracranial hemorrhage, while CT cervical spine showed  no evidence of acute fracture or traumatic listhesis of the cervical spine.  EDP discussed the patient's case with the on-call [facility] gastroenterologist, Dr. Therisa Doyne, who will formally consult and plans to see the patient in the morning, with additional recommendations pending at this time.  While in the ED, the following were administered: Transfusion of 2 units PRBC as well as initiated.  Subsequently patient was admitted to the PCU for overnight observation for further evaluation management of presenting suspected acute lower gastrointestinal bleeding associated with acute on chronic blood loss anemia.     Review of Systems: As per HPI otherwise 10 point review of systems negative.   Past Medical History:  Diagnosis Date   Abnormal liver function     in the past.   Anemia    Arthritis    all over   Bruises easily    Cataract    right eye;immature   CHF (congestive heart failure) (HCC)    Chronic back pain    stenosis   Chronic cough    Chronic kidney disease    COPD (chronic obstructive pulmonary disease) ([place])    COVID    aug/sept 2021   Demand myocardial infarction [facility]) 2012   Demand Infarction in setting of Pancreatitis --> mild Troponin elevation, NON-OBSTRUCTIVE CAD   Depression    takes Abilify daily as well as Zoloft   Diastolic heart failure 7517   Grade 1 diastolic Dysfunction by Echo    Diverticulosis    DM (diabetes mellitus) ([place])  takes Victoza daily as well as Lantus and Humalog   Empty sella ([place])    on MRI in 2009.   Glaucoma    Headache(784.0)    last migraine-4-43yr ago   History of blood transfusion    no abnormal reaction noted   History of colon polyps    benign   HTN (hypertension)    takes Benicar,Imdur,and Bystolic daily   Hyperlipidemia    takes Lipitor daily   Joint swelling    Nocturia    Obesity hypoventilation syndrome ([place]    Obstructive sleep apnea 02/2018   Notably improved split-night study with weight loss from 270 (2017)  down to 250 pounds (2019).   Pancreatitis    takes Pancrelipase daily   Parkinson's disease ([place]    takes Sinemet daily   Peripheral neuropathy    Pneumonia 2012   [place]izures [place]    takes Lamictal daily and Primidone nightly;last seizure 2wks ago   Urinary urgency    With increased frequency   Varicose veins of both lower extremities with pain    With edema.  Takes daily Lasix    Past Surgical History:  Procedure Laterality Date   ABDOMINAL HYSTERECTOMY     APPENDECTOMY     BACK SURGERY     Cardiac Event Monitor  September-October 2017   Sinus rhythm with occasional PACs and artifact. No arrhythmias besides one short run of tachycardia.   CHOLECYSTECTOMY     COLONOSCOPY N/A 08/15/2012   Procedure: COLONOSCOPY;  Surgeon: WArta Silence MD;  Location: [facility] ENDOSCOPY;  Service: Endoscopy;  Laterality: N/A;   COLONOSCOPY WITH PROPOFOL Left 06/17/2017   Procedure: COLONOSCOPY WITH PROPOFOL;  Surgeon: KRonnette Juniper MD;  [facility]  Service: Gastroenterology;  Laterality: Left;   CORONARY CALCIUM SCORE AND CTA  04/2018   Coronary calcium score 71.9.  Very large, hyperdynamic LAD wrapping apex giving rise to PDA.  Moderate LAD-diagonal and circumflex plaque -> CT FFR suggested positive findings in distal D1, D2 and distal circumflex.  Referred for cath. ==> FALSE POSITIVE   ESOPHAGOGASTRODUODENOSCOPY     eye cysts Bilateral    LASIK     LEFT HEART CATH AND CORONARY ANGIOGRAPHY N/A 05/16/2018   Procedure: LEFT HEART CATH AND CORONARY ANGIOGRAPHY;  Surgeon: SBelva Crome MD;  Location: [facility] INVASIVE CV LAB:  Angiographically normal coronary arteries with LVEDP of 21 mmHg.  Large draping hyperdominant LAD that wraps the apex and provides distal half of the PDA.  Relatively small caliber distal Cx -->proxLPDA, non-dom RCA. No Cx or Diag lesions -- FALSE + CT FFR   LEFT HEART CATH AND CORONARY ANGIOGRAPHY  2009/March 2012   2009: (Dr. KAlvie Heidelberg Nonobstructive CAD; 2012: Minimal CAD  --> false positive stress test   NM MYOVIEW LTD  01/2016; 01/2018   a)LOW RISK. No ischemia or infarction;; b) EF 55-60%. NO ST changes.  No ischemia or Infarction. NO significant RV enlargement.  LOW RISK.   Polysomnogram  02/2018   (Dr. DBrett Fairyfrom neurology): Split study was not adequately done due to low AHI.  She slept reclined and had an AHI less than 10, prolonged hypoxemia and no hypercapnia. -->  She has home oxygen already prescribed, but did not meet criteria for nightly oxygen.  (Was noted that the patient did not cooperate well with study.  Titration was discussed.   PRESSURE SENSOR/CARDIOMEMS N/A 11/09/2019   Procedure: PRESSURE SENSOR/CARDIOMEMS;  Surgeon: MLarey Dresser MD;  [place] [facility];  Service: Cardiovascular;  Laterality: N/A;   RECTAL POLYPECTOMY     RIGHT/LEFT HEART CATH AND CORONARY ANGIOGRAPHY N/A 04/06/2019   Procedure: RIGHT/LEFT HEART CATH AND CORONARY ANGIOGRAPHY;  Surgeon: Leonie Man, MD;  Location: [facility] INVASIVE CV LAB; angiographically normal coronary arteries.  Moderately elevated AD LVEDP..  Mild pulmonary hypertension: PA P 56/14 mmHg-mean 31 mmHg.  RAP 10 mmHg.  RV P-EDP 54/4 mmHg - 12 mmHg.  PCWP 11-15 mmHg.  CO-CI: 7.35-3.73.   TONSILLECTOMY     TRANSTHORACIC ECHOCARDIOGRAM  01/2016; 05/2017   A) Normal LV chamber size with mod LVH pattern. EF 50-55%. Severe LA dilation. Mod RA dilation. PAP elevated at 38 mmHg;; B) Mild concentric LVH.  EF 55-60% & no RWMA.  Grade 2 DD.  Diastolic flattening of the ventricular septum == ? elevated PAP.  Mild aortic valve calcification/sclerosis.  Mod LA dilation.  Mild RV dilation & Mod RA dilation   TRANSTHORACIC ECHOCARDIOGRAM  10/2017; 01/2018   a) EF 55-60%.  No RWMA,  Moderate LA dilation.  Mild LA dilation.  Peak PA pressures ~57 mmHg (moderate);;; b) EF 55-60%.  GRII DD.  Suggestion of RV volume overload with moderate RV dilation.  Severe LA and RA dilation.Marland Kitchen  PA pressure ~67%.   TRANSTHORACIC  ECHOCARDIOGRAM  01/2019   Normal LV size and function.  EF 50 to 55%.  Impaired relaxation.  RV appears to have moderately reduced function.  Severely enlarged.  Cannot fully assess RV pressures.  Hypermobile interatrial septum.  Right atrium severely dilated.   TUBAL LIGATION     vaginal cyst removed     several times    Social History:  reports that she has quit smoking. Her smoking use included cigarettes. She has a 12.50 pack-year smoking history. She has never used smokeless tobacco. She reports that she does not drink alcohol and does not use drugs.   Allergies  Allergen Reactions   Other Anaphylaxis and Rash    Bleach   Penicillins Hives    Did it involve swelling of the face/tongue/throat, SOB, or low BP? No Did it involve sudden or severe rash/hives, skin peeling, or any reaction on the inside of your mouth or nose? Yes Did you need to seek medical attention at a hospital or doctor's office? Yes When did it last happen?      Childhood allergy  If all above answers are "NO", may proceed with cephalosporin use.     Sulfa Antibiotics Hives   Aspirin Other (See Comments)     On aspirin 81 mg - Rectal bleeding in dec 2018   Codeine     Headache and makes the patient feel "off"    Family History  Problem Relation Age of Onset   Allergies Father    Heart disease Father        before 59   Heart failure Father    Hypertension Father    Hyperlipidemia Father    Heart disease Mother    Diabetes Mother    Hypertension Mother    Hyperlipidemia Mother    Hypertension Sister    Heart disease Sister        before 65   Hyperlipidemia Sister    Diabetes Brother    Hypertension Brother    Diabetes Sister    Hypertension Sister    Diabetes Son    Hypertension Son    Cancer Other     Family history reviewed and not pertinent    Prior to Admission medications   Medication Sig  Start Date End Date Taking? Authorizing Provider  amLODipine (NORVASC) 5 MG tablet Take 1  tablet (5 mg total) by mouth daily. 01/11/21 01/11/22 Yes Larey Dresser, MD  Ascorbic Acid (VITAMIN C) 1000 MG tablet Take 1,000 mg by mouth daily.   Yes [provider]  atorvastatin (LIPITOR) 10 MG tablet TAKE 1 TABLET EVERY EVENING. 03/23/21  Yes [name] Chard, MD  carvedilol (COREG) 25 MG tablet TAKE 1 TABLET TWICE DAILY WITH A MEAL 08/16/20  Yes Leonie Man, MD  dexlansoprazole (DEXILANT) 60 MG capsule Take 1 capsule (60 mg total) by mouth daily. 11/08/20  Yes [name] Chard, MD  diclofenac sodium (VOLTAREN) 1 % GEL Apply 2 g topically 4 (four) times daily as needed (pain). 05/17/18  Yes Vann, Jessica U, DO  ferrous sulfate 324 MG TBEC Take 324 mg by mouth 2 (two) times daily.   Yes [provider]  furosemide (LASIX) 80 MG tablet TAKE 1 TABLET DAILY ON MONDAY THROUGH FRIDAY, AND 1/2 TABLET ON SATURDAY AND SUNDAY 11/24/20  Yes Larey Dresser, MD  Pancrelipase, Lip-Prot-Amyl, 25000-79000 units CPEP Take 1-2 capsules by mouth See admin instructions. Take 1 capsules in the morning with breakfast, 1 capsule with lunch, and 2 capsules with supper   Yes [provider]  potassium chloride SA (KLOR-CON) 20 MEQ tablet TAKE 2 TABLETS EVERY DAY BUT WHEN TAKING METOLAZONE TAKE 3 TABLETS DAILY AS DIRECTED Patient taking differently: Take 40 mEq by mouth daily. 08/31/20  Yes Leonie Man, MD  sertraline (ZOLOFT) 50 MG tablet TAKE 1 TABLET EVERY DAY 08/11/20  Yes [name] Chard, MD  tiotropium (SPIRIVA HANDIHALER) 18 MCG inhalation capsule Place 1 capsule (18 mcg total) into inhaler and inhale daily. 11/18/20 11/18/21 Yes [place] Eric J, DO  TOUJEO SOLOSTAR 300 UNIT/ML Solostar Pen Inject 25 Units into the skin at bedtime. 09/01/20  Yes [provider]  acetaminophen (TYLENOL) 500 MG tablet Take 500-1,000 mg by mouth every 4 (four) hours as needed for moderate pain.     [provider]  albuterol (VENTOLIN HFA) 108 (90 Base) MCG/ACT inhaler Inhale 1-2 puffs into  the lungs every 6 (six) hours as needed for wheezing or shortness of breath. Patient not taking: Reported on 02/06/2021    [provider]  Alcohol Swabs (B-D SINGLE USE SWABS REGULAR) PADS USE TWICE DAILY AS DIRECTED 08/31/20   [name] Chard, MD  B-D ULTRAFINE III SHORT PEN 31G X 8 MM MISC USE DAILY WITH VICTOZA AND/OR NOVOLOG. 07/08/20   [name] Chard, MD  benzonatate (TESSALON PERLES) 100 MG capsule Take 1 capsule (100 mg total) by mouth 3 (three) times daily as needed for cough. 11/10/20 11/10/21  Bary Castilla, NP  Blood Glucose Calibration (TRUE METRIX LEVEL 1) Low SOLN Use as directed  Dx:e11.22 11/28/20   [name] Chard, MD  Blood Glucose Monitoring Suppl (TRUE METRIX METER) w/Device KIT Use as directed to check blood sugars 2 times per day dx: e11.22 03/07/20   [name] Chard, MD  Glucagon (GVOKE HYPOPEN 1-PACK) 0.5 MG/0.1ML SOAJ Inject 0.5 mg into the skin daily as needed. 03/27/21   [name] Chard, MD  glucose blood (TRUE METRIX BLOOD GLUCOSE TEST) test strip USE AS DIRECTED TO CHECK BLOOD SUGARS 2 TIMES PER DAY 03/07/21   [name] Chard, MD  guaiFENesin-dextromethorphan [facility] DM) 100-10 MG/5ML syrup Take 10 mLs by mouth every 6 (six) hours as needed for cough. Patient not taking: Reported on 02/06/2021 11/18/20   [place] Eric J, DO  hydrALAZINE (APRESOLINE) 100 MG  tablet Take 1 tablet (100 mg total) by mouth 3 (three) times daily. 10/21/20   Larey Dresser, MD  hydrocortisone cream 1 % Apply 1 application topically daily as needed for itching.     [provider]  insulin lispro (HUMALOG KWIKPEN) 100 UNIT/ML KwikPen Inject 0.02-0.04 mLs (2-4 Units total) into the skin 3 (three) times daily as needed (high blood sugar over 150). 11/23/20   Bary Castilla, NP  Isopropyl Alcohol (ALCOHOL WIPES) 70 % MISC Apply 1 each topically 2 (two) times daily. 06/25/19   Minette Brine, FNP  isosorbide mononitrate (IMDUR) 30 MG 24 hr tablet TAKE 1 TABLET EVERY DAY (APPOINTMENT IS  NEEDED FOR REFILLS) 03/13/21   Leonie Man, MD  lamoTRIgine (LAMICTAL) 150 MG tablet Take 1 tablet (150 mg total) by mouth 2 (two) times daily. 12/09/20   [name] Chard, MD  levETIRAcetam (KEPPRA XR) 500 MG 24 hr tablet Take 3 tablets every night 10/26/20   Cameron Sprang, MD  ondansetron [facility]) 4 MG tablet Take 4 mg by mouth every 8 (eight) hours as needed for nausea or vomiting. 02/22/20   [provider]  OZEMPIC, 1 MG/DOSE, 4 MG/3ML SOPN INJECT 1 MG INTO THE SKIN ONCE A WEEK. 11/08/20   [name] Chard, MD  sennosides-docusate sodium (SENOKOT-S) 8.6-50 MG tablet Take 1 tablet by mouth daily.    [provider]  tolnaftate (TINACTIN) 1 % cream Apply 1 application topically daily as needed (foot fungus).     [provider]  TRUEplus Lancets 33G MISC Use as directed to check blood sugars 2 times per day dx: e11.22 11/28/20   [name] Chard, MD     Objective    Physical Exam: Vitals:   03/31/21 2046 03/31/21 2049 03/31/21 2115 03/31/21 2255  BP:   (!) 127/56 (!) 112/47  Pulse:   81 66  Resp:   20 15  Temp:      TempSrc:      SpO2: 95%  97% 98%  Weight:  87.5 kg    Height:  $Remove'5\' 1"'YdWnbAS$  (1.549 m)      General: appears to be stated age; alert, oriented Skin: warm, dry, no rash Head:  AT/[place] Mouth:  Oral mucosa membranes appear dry, normal dentition Neck: supple; trachea midline Heart:  RRR; did not appreciate any M/R/G Lungs: CTAB, did not appreciate any wheezes, rales, or rhonchi Abdomen: + BS; soft, ND, NT Vascular: 2+ pedal pulses b/l; 2+ radial pulses b/l Extremities: no peripheral edema, no muscle wasting Neuro: strength and sensation intact in upper and lower extremities b/l    Labs on Admission: I have personally reviewed following labs and imaging studies  CBC: Recent Labs  Lab 03/31/21 2044  WBC 5.3  NEUTROABS 2.8  HGB 5.1*  HCT 16.6*  MCV 76.5*  PLT 826   Basic Metabolic Panel: Recent Labs  Lab 03/27/21 1536 03/31/21 2044  NA 137  133*  K 3.9 3.3*  CL 96 97*  CO2 26 25  GLUCOSE 85 185*  BUN 16 14  CREATININE 0.82 0.88  CALCIUM 9.6 8.6*   GFR: Estimated Creatinine Clearance: 58.1 mL/min (by C-G formula based on SCr of 0.88 mg/dL). Liver Function Tests: Recent Labs  Lab 03/31/21 2044  AST 20  ALT 14  ALKPHOS 102  BILITOT 0.2*  PROT 6.6  ALBUMIN 3.2*   No results for input(s): LIPASE, AMYLASE in the last 168 hours. No results for input(s): AMMONIA in the last 168 hours. Coagulation Profile: No results for  input(s): INR, PROTIME in the last 168 hours. Cardiac Enzymes: No results for input(s): CKTOTAL, CKMB, CKMBINDEX, TROPONINI in the last 168 hours. BNP (last 3 results) No results for input(s): PROBNP in the last 8760 hours. HbA1C: No results for input(s): HGBA1C in the last 72 hours. CBG: No results for input(s): GLUCAP in the last 168 hours. Lipid Profile: No results for input(s): CHOL, HDL, LDLCALC, TRIG, CHOLHDL, LDLDIRECT in the last 72 hours. Thyroid Function Tests: No results for input(s): TSH, T4TOTAL, FREET4, T3FREE, THYROIDAB in the last 72 hours. Anemia Panel: No results for input(s): VITAMINB12, FOLATE, FERRITIN, TIBC, IRON, RETICCTPCT in the last 72 hours. Urine analysis:    Component Value Date/Time   COLORURINE YELLOW 01/28/2020 2325   APPEARANCEUR CLEAR 01/28/2020 2325   LABSPEC 1.005 01/28/2020 2325   PHURINE 7.0 01/28/2020 2325   GLUCOSEU NEGATIVE 01/28/2020 2325   GLUCOSEU NEGATIVE 04/17/2016 0955   HGBUR NEGATIVE 01/28/2020 2325   BILIRUBINUR Negative 12/06/2020 1701   KETONESUR NEGATIVE 01/28/2020 2325   PROTEINUR Negative 12/06/2020 1701   PROTEINUR NEGATIVE 01/28/2020 2325   UROBILINOGEN 0.2 12/06/2020 1701   UROBILINOGEN 0.2 04/17/2016 0955   NITRITE Negative 12/06/2020 1701   NITRITE NEGATIVE 01/28/2020 2325   LEUKOCYTESUR Negative 12/06/2020 1701   LEUKOCYTESUR NEGATIVE 01/28/2020 2325    Radiological Exams on Admission: CT HEAD WO CONTRAST (5MM)  Result  Date: 03/31/2021 CLINICAL DATA:  Seizure.  Followed by fall. EXAM: CT HEAD WITHOUT CONTRAST CT CERVICAL SPINE WITHOUT CONTRAST TECHNIQUE: Multidetector CT imaging of the head and cervical spine was performed following the standard protocol without intravenous contrast. Multiplanar CT image reconstructions of the cervical spine were also generated. COMPARISON:  CT head 01/28/2020, MR head 11/10/2019 FINDINGS: CT HEAD FINDINGS BRAIN: BRAIN Patchy and confluent areas of decreased attenuation are noted throughout the deep and periventricular white matter of the cerebral hemispheres bilaterally, compatible with chronic microvascular ischemic disease. No evidence of large-territorial acute infarction. No parenchymal hemorrhage. No mass lesion. No extra-axial collection. No mass effect or midline shift. No hydrocephalus. Basilar cisterns are patent. Partial empty sella. Vascular: No hyperdense vessel. Atherosclerotic calcifications are present within the cavernous internal carotid arteries. Skull: No acute fracture or focal lesion. Sinuses/Orbits: Paranasal sinuses and mastoid air cells are clear. Bilateral lens replacement. Otherwise orbits are unremarkable. Other: None. CT CERVICAL SPINE FINDINGS Alignment: Normal. Skull base and vertebrae: Multilevel degenerative changes of the spine. No acute fracture. No aggressive appearing focal osseous lesion or focal pathologic process. Soft tissues and spinal canal: No prevertebral fluid or swelling. No visible canal hematoma. Upper chest: Biapical pleural/pulmonary scarring. Emphysematous changes. Other: None. IMPRESSION: 1. No acute intracranial abnormality. 2. No acute displaced fracture or traumatic listhesis of the cervical spine. 3.  Emphysema (ICD10-J43.9). 4. Persistent partial empty sella. Findings is often a normal anatomic variant but can be associated with idiopathic intracranial hypertension (pseudotumor cerebri). Electronically Signed   By: Iven Finn M.D.    On: 03/31/2021 22:16   CT Cervical Spine Wo Contrast  Result Date: 03/31/2021 CLINICAL DATA:  Seizure.  Followed by fall. EXAM: CT HEAD WITHOUT CONTRAST CT CERVICAL SPINE WITHOUT CONTRAST TECHNIQUE: Multidetector CT imaging of the head and cervical spine was performed following the standard protocol without intravenous contrast. Multiplanar CT image reconstructions of the cervical spine were also generated. COMPARISON:  CT head 01/28/2020, MR head 11/10/2019 FINDINGS: CT HEAD FINDINGS BRAIN: BRAIN Patchy and confluent areas of decreased attenuation are noted throughout the deep and periventricular white matter of the cerebral hemispheres bilaterally,  compatible with chronic microvascular ischemic disease. No evidence of large-territorial acute infarction. No parenchymal hemorrhage. No mass lesion. No extra-axial collection. No mass effect or midline shift. No hydrocephalus. Basilar cisterns are patent. Partial empty sella. Vascular: No hyperdense vessel. Atherosclerotic calcifications are present within the cavernous internal carotid arteries. Skull: No acute fracture or focal lesion. Sinuses/Orbits: Paranasal sinuses and mastoid air cells are clear. Bilateral lens replacement. Otherwise orbits are unremarkable. Other: None. CT CERVICAL SPINE FINDINGS Alignment: Normal. Skull base and vertebrae: Multilevel degenerative changes of the spine. No acute fracture. No aggressive appearing focal osseous lesion or focal pathologic process. Soft tissues and spinal canal: No prevertebral fluid or swelling. No visible canal hematoma. Upper chest: Biapical pleural/pulmonary scarring. Emphysematous changes. Other: None. IMPRESSION: 1. No acute intracranial abnormality. 2. No acute displaced fracture or traumatic listhesis of the cervical spine. 3.  Emphysema (ICD10-J43.9). 4. Persistent partial empty sella. Findings is often a normal anatomic variant but can be associated with idiopathic intracranial hypertension  (pseudotumor cerebri). Electronically Signed   By: Iven Finn M.D.   On: 03/31/2021 22:16     EKG: Independently reviewed, with result as described above.    Assessment/Plan   ARLOA PRAK is a 75 y.o. female with medical history significant for chronic iron deficiency anemia with baseline hemoglobin 9-12, diverticulosis, chronic diastolic heart failure, COPD, type 2 diabetes mellitus, seizure disorder, who is admitted to [facility] on 03/31/2021 with acute lower gastrointestinal bleed after presenting from home to [facility] ED complaining of loss of consciousness.    Principal Problem:   Acute lower GI bleeding Active Problems:   Dyslipidemia, goal LDL below 100   DM2 (diabetes mellitus, type 2) (HCC)   Seizure (HCC)   Chronic diastolic CHF (congestive heart failure), NYHA class 2 (HCC)   Syncope   Acute on chronic blood loss anemia   Hypokalemia     #) Acute lower GI bleed: diagnosis on the basis of 2 days of bright red blood per rectum, with DRE performed in the ED today notable for fecal occult positive finding.  Additionally, presentation appears to be associated with acute on chronic blood loss anemia, as further described/quantified below. A non-elevated presenting BUN, that is trended down from most recent prior value, as further quantified above, further substantiates suspicion of a lower GI source. Patient appears hemodynamically stable with normotensive/non-tachycardic vital signs thus far, while noting that she is on a beta-blocker at home.appears to be associated with syncopal episode earlier today.  Otherwise, the patient conveys that she is remained asymptomatic, including no associated chest pain or shortness of breath.  Not on any blood thinners as an outpatient, including no aspirin. No reported history of alcohol abuse. No known history of liver disease to warrant SBP prophylaxis.   Differential includes diverticular bleed, particularly in the setting of a  documented history of diverticulosis as well as prior diverticular bleeds, with ddx also including colorectal malignancy, bleeding colonic polyps, AVM's, hemorrhoids. Will check INR.  Given suspected lower GI source, there does not appear to be an indication for initiation of Protonix drip.   case was discussed with the on-call [facility] gastroenterologist, Dr. Therisa Doyne, who will formally consult, as further detailed above.  Transfusion of 2 units PRBC was initiated in the ED.      Plan: NPO. Refraining from pharmacologic DVT prophylaxis. Monitor on telemetry. Monitor continuous pulse-ox. Maintain at least 2 large bore IV's. Check INR. Q4H H&H's have been ordered through1300 on 04/01/21. Will continue existing transfusion  of 2 units PRBC. Will closely monitor these ensuing Hgb levels and correlate these data points with the patient's overall clinical picture including vital signs to determine need for subsequent transfusion.  Gastroenterology consulted, as above.      #) Acute on chronic blood loss anemia: in the setting of suspected acute lower GI bleed, as above, presenting Hgb noted to be 5.1, which is relative to her baseline hemoglobin range of 9-12, including most recent prior value of 11.8 on 12/23/2020 . At this time, patient appears hemodynamically stable and asymptomatic following episode of syncope at home, as further described above.  Type and screen completed in the ED today along with initiation of transfusion of 2 units PRBC, as further described above.   Plan: work-up and management for presenting suspected acute lower GI bleed, as above, including close monitoring of Q4H H&H's, with clinical evaluation for determination of need for blood transfusion, as further described above.  Transfusion of 2 units PRBC , as above. monitor on telemetry. Monitor continuous pulse-ox. NPO. Refraining from pharmacologic DVT prophylaxis. Check INR. Add on the following to initial lab specimen collected in the ED  today: total iron, TIBC, ferritin, MMA, folic acid level, reticulocyte count. GI consulted, as above.        #) Syncope: 1 episode of syncope that occurred when the patient was moving from a seated to a standing position and associated with prodrome, suggestive of potential orthostatic hypotension in the setting of intravascular volume depletion as a consequence of presenting acute lower gastrointestinal bleed as well as resultant acute on chronic blood loss anemia, and exacerbated by home pharmacologic factors serving to diminish compensatory tachycardic/peripheral vasoconstrictive response in the setting of outpatient Coreg and Norvasc, hydralazine, Imdur.  The patient does have a history of seizure disorder, syncopal presentation appears less suggestive of contributory acute seizure, relative to orthostatic hypotension in the setting of acute on chronic blood loss anemia, as above.  Not associate with any overt acute focal neurologic deficits, clinically, presentation is less suggestive of acute ischemic stroke.  Of note, CTA chest no evidence of acute intracranial process.  Presentation appears less consistent with ACS at this time, with presenting EKG showing no evidence of acute ischemic changes, and the absence of any associated CP.  Will trend serial troponin to further assess, will closely monitoring on telemetry overnight.  In setting of associated prodrome, the possibility of ventricular arrhythmia appears to be less likely at this time, although will closely monitor on telemetry overnight for any evidence of potential contributory arrhythmia, while also assessing serum Mg level, will also pursue further evaluation management of acute on chronic blood loss anemia in the setting of suspected acute lower GI bleed, as above.      Plan: Further management of acute on chronic blood loss anemia in setting of acute lower GI bleed, as above, including transfusion of 2 units PRBC, with close  monitoring of ensuing serial hemoglobin trend.  Monitor on telemetry. Hold home BB and other outpatient antihypertensive medications for now.   Monitor strict I's and O's.  Add-on serum Mg level. Check CMP, CBC, serum Mg level in the AM. Fall precautions ordered.  Trend serial troponin.  Chest x-ray.     #) Hypokalemia: Presenting serum potassium noted to be 3.3.  AKI in setting of presenting syncope, will provide gentle IV supplementation, as further detailed below.  Plan: Potassium chloride 20 mg IV every 2 hours x1 dose now.  Add on serum magnesium level.  Monitor  on telemetry.  Repeat CMP in the morning.      #) Chronic diastolic heart failure: documented history of such, with most recent echocardiogram performed in May 2022 demonstrating grade 1 diastolic dysfunction, with additional details further described above. No clinical evidence to suggest acutely decompensated heart failure at this time, but will also pursue a chest x-ray in the setting of presenting syncope. Patient's home diuretic regimen reportedly consists of the following: Lasix 80 mg p.o. daily on Friday as well as Lasix 40 mg p.o. daily on Saturday and Sunday. Home cardiac medications also include the following: Coreg. Presenting EKG shows sinus rhythm without evidence of acute ischemic changes.  In the setting of plan for PRBC transfusion on the basis of acute on chronic blood loss anemia in the setting of acute lower gastrointestinal pain, will closely monitor ensuing development of evidence of acute volume overload.   Plan: monitor strict I's & O's and daily weights. Repeat BMP in the morning. Check serum magnesium level. Will hold home Lasix per now, but can consider dose of IV Lasix following PRBC transfusion dependent upon ensuing clinical trend.  Hold home Coreg for now.  Monitor on telemetry.     #) COPD: Documented history of symptoms setting of being a former smoker.  Outpatient respiratory regimen includes Spiriva  as well as as needed albuterol inhaler.  No evidence to suggest acute exacerbation at this time.  Plan: Continue home Spiriva as well as as needed albuterol inhaler.  Repeat BMP in the morning.       #) Type 2 diabetes mellitus: Documented history of such, with most recent hemoglobin A1c noted to be 7.3% on 03/27/2021.  He is on basal insulin in the form of Toujeo 25 units sq HS as well as short acting insulin sliding scale Humalog 3 times daily with meals.  Presenting blood sugar noted to be 185.  Will resume a portion of his home basal insulin at this time, with close monitoring for hypoglycemia in the setting of plan for n.p.o. status as component of management of presenting acute lower gastrointestinal bleed.  Plan: Lantus 6 subcu nightly with first dose now.  Accu-Cheks every 6 hours with low-dose sliding scale insulin.      #) Essential Hypertension: documented history of such, with outpatient antihypertensive regimen including Coreg, Imdur, Norvasc, and hydralazine. Systolic Blood pressure in the ED today have been normotensive thus far.  In the setting of presenting acute lower gastrointestinal bleed associated with acute on chronic blood loss anemia, will hold home antihypertensive medications for now.    Plan: Hold BP meds for now, as above close monitoring of subsequent blood pressure via routine vital signs.         #) Hyperlipidemia: Atorvastatin 10 mg p.o. nightly as an outpatient.  Plan: Hold home statin for now the setting of current n.p.o. status.       #) Seizure disorder: Documented history of such, on Lamictal as well as Keppra as an outpatient.  Reported most recent prior seizure occurred approximately 8 months ago.  Episode of loss of consciousness earlier today appears clinically consistent with syncope as a consequence of orthostatic hypotension in the setting of acute on chronic blood loss anemia due to acute lower gastrointestinal bleed, as opposed to  representing an acute seizure, particularly given no evidence of a postictal state following brief period of loss of consciousness.  In the setting of current n.p.o. status, will convert home p.o. Keppra to IV form, with one-to-one ratio.  Plan: I placed an order for Keppra 1500 mg IV nightly, with first dose to occur now, as above.  Hold home Lamictal for now.  As needed IV Ativan for seizures.      DVT prophylaxis: SCDs Code Status: Full code Family Communication: none Disposition Plan: Per Rounding Team Consults called: none  Admission status: Observation; PCU   Of note, this patient was added by me to the following Admit List/Treatment Team:  mcadmits.   Of note, the Adult Admission Order Set (Multimorbid order set) was used by me in the admission process for this patient.  PLEASE NOTE THAT DRAGON DICTATION SOFTWARE WAS USED IN THE CONSTRUCTION OF THIS NOTE.   Rhetta Mura DO [facility] Pager [phone] From [place]   03/31/2021, 11:18 PM

## 2021-03-31 NOTE — ED Notes (Signed)
EDP at the bedside.  ?

## 2021-03-31 NOTE — ED Provider Notes (Signed)
[facility] EMERGENCY DEPARTMENT Provider Note   CSN: [id number] Arrival date & time: 03/31/21  2018     History Chief Complaint  Patient presents with   Loss of Consciousness    [name] is a 75 y.o. female.  Patient brought here by EMS for concern for seizures/loss of consciousness.  Patient has history of seizures, heart failure, Parkinson's.  She states that she has had some bright red blood in her stool and blood clots the last several days.  She is not on a blood thinner.  She has a history of GI bleed in the past.  Had episodes of sitting at the table with seizure-like activity.  Slumped over at the table and fall on the floor.  Currently she has no chest pain or shortness of breath.  No weakness or numbness.  States that she had bloody bowel movement this morning.  States that may be history of diverticulosis.  The history is provided by the patient.  Loss of Consciousness Episode history:  Multiple Most recent episode:  Today Timing:  Intermittent Progression:  Resolved Chronicity:  New Witnessed: yes   Relieved by:  Bed rest Worsened by:  Nothing Associated symptoms: malaise/fatigue, rectal bleeding and seizures   Associated symptoms: no anxiety, no chest pain, no confusion, no diaphoresis, no difficulty breathing, no dizziness, no fever, no focal weakness, no headaches, no nausea, no palpitations, no shortness of breath and no vomiting   Risk factors: seizures       Past Medical History:  Diagnosis Date   Abnormal liver function     in the past.   Anemia    Arthritis    all over   Bruises easily    Cataract    right eye;immature   CHF (congestive heart failure) (HCC)    Chronic back pain    stenosis   Chronic cough    Chronic kidney disease    COPD (chronic obstructive pulmonary disease) ([place])    COVID    aug/sept 2021   Demand myocardial infarction [facility]) 2012   Demand Infarction in setting of Pancreatitis --> mild Troponin  elevation, NON-OBSTRUCTIVE CAD   Depression    takes Abilify daily as well as Zoloft   Diastolic heart failure 7510   Grade 1 diastolic Dysfunction by Echo    Diverticulosis    DM (diabetes mellitus) ([place])    takes Victoza daily as well as Lantus and Humalog   Empty sella ([place])    on MRI in 2009.   Glaucoma    Headache(784.0)    last migraine-4-23yr ago   History of blood transfusion    no abnormal reaction noted   History of colon polyps    benign   HTN (hypertension)    takes Benicar,Imdur,and Bystolic daily   Hyperlipidemia    takes Lipitor daily   Joint swelling    Nocturia    Ob[place] hypoventilation syndrome ([place]    Obstructive sleep apnea 02/2018   Notably improved split-night study with weight loss from 270 (2017) down to 250 pounds (2019).   Pancreatitis    takes Pancrelipase daily   [place]'s disease [place]    takes Sinemet daily   Peripheral neuropathy    Pneumonia 2012   Seizures ([place]    takes Lamictal daily and Primidone nightly;last seizure 2wks ago   Urinary urgency    With increased frequency   Varicose veins of both lower extremities with pain    With edema.  Takes daily  Lasix    Patient Active Problem List   Diagnosis Date Noted   SOB (shortness of breath) 11/14/2020   Postinflammatory pulmonary fibrosis ([place]) 06/08/2020   Lung nodule 06/08/2020   Chronic kidney disease, stage 2 (mild) 01/05/2020   Hypertensive chronic kidney disease with stage 1 through stage 4 chronic kidney disease, or unspecified chronic kidney disease 01/05/2020   Snoring 01/05/2020   Neuroleptic-induced parkinsonism ([place]) 09/28/2019   Class 3 severe obesity due to excess calories with serious comorbidity and body mass index (BMI) of 40.0 to 44.9 in adult ([place]) 05/22/2019   Pulmonary hypertension, unspecified ([place]) 04/06/2019   Syncope 02/24/2019   Rheumatoid arthritis ([place]) 01/21/2019   Radial styloid tenosynovitis 11/24/2018   Type 2 diabetes mellitus with stage 2  chronic kidney disease, with long-term current use of insulin ([place]) 07/21/2018   Chest pain 05/15/2018   Excessive daytime sleepiness 01/30/2018   Sleep-related hypoventilation 01/30/2018   Recurrent depression ([place]) 11/06/2017   OSA on CPAP 10/07/2017   Todd's paralysis (HCC)    Chronic diastolic CHF (congestive heart failure), NYHA class 2 (HCC)    Chronic respiratory failure with hypoxia (HCC)    Acute lower GI bleeding    Stroke-like symptoms 06/15/2017   Rectal bleeding 06/15/2017   Dehydration 03/07/2017   Medication management 12/24/2016   Near syncope 03/09/2016   Racing heart beat 03/09/2016   Dyspnea 01/28/2016   Spinal stenosis at L4-L5 level 10/04/2014   Parkinsonism ([place]) 06/24/2013   Lower abdominal pain 10/31/2012   Type II diabetes mellitus, uncontrolled 10/31/2012   Seizure disorder ([place]) 10/31/2012   Diverticulosis 10/31/2012   Anemia associated with acute blood loss 08/14/2012   Hematochezia 08/14/2012   Varicose veins of bilateral lower extremities with other complications 03/54/6568   Leg pain 03/21/2012   Radicular pain of left lower extremity 03/21/2012   Heart murmur 11/21/2011   Infected pseudocyst of pancreas 10/28/2011   Chronic pancreatitis ([place]) 10/28/2011   Hypertensive heart disease with chronic diastolic congestive heart failure ([place]) 10/28/2011   Dyslipidemia, goal LDL below 100 10/28/2011   Morbid obesity with BMI of 45.0-49.9, adult ([place]) 10/28/2011   Pseudoseizure 10/24/2011   Gastroesophageal reflux 10/23/2011   History of tobacco abuse 10/23/2011   Hypoxemia 10/23/2011   Disturbance in sleep behavior 09/03/2011   Partial epilepsy with impairment of consciousness ([place]) 09/03/2011   Tremor 09/03/2011   Peripheral edema 04/14/2011   Obesity hypoventilation syndrome ([place]) 10/26/2010   Atherosclerotic heart disease of native coronary artery with other forms of angina pectoris ([place]) 08/28/2010   Acute, but ill-defined, cerebrovascular  disease 05/04/2008   Arthropathy 05/04/2008   Disequilibrium 05/04/2008   Generalized tonic clonic epilepsy ([place]) 05/04/2008   Increased frequency of urination 05/04/2008   Insomnia, unspecified 05/04/2008   Major depressive disorder, single episode, unspecified 05/04/2008   Memory loss 05/04/2008   Nausea 05/04/2008   Other malaise and fatigue 05/04/2008   Swelling of limb 05/04/2008    Past Surgical History:  Procedure Laterality Date   ABDOMINAL HYSTERECTOMY     APPENDECTOMY     BACK SURGERY     Cardiac Event Monitor  September-October 2017   Sinus rhythm with occasional PACs and artifact. No arrhythmias besides one short run of tachycardia.   CHOLECYSTECTOMY     COLONOSCOPY N/A 08/15/2012   Procedure: COLONOSCOPY;  Surgeon: Arta Silence, MD;  Location: [facility];  Service: Endoscopy;  Laterality: N/A;   COLONOSCOPY WITH PROPOFOL Left 06/17/2017   Procedure: COLONOSCOPY WITH PROPOFOL;  Surgeon: Therisa Doyne,  Megan Salon, MD;  Location: [place];  Service: Gastroenterology;  Laterality: Left;   CORONARY CALCIUM SCORE AND CTA  04/2018   Coronary calcium score 71.9.  Very large, hyperdynamic LAD wrapping apex giving rise to PDA.  Moderate LAD-diagonal and circumflex plaque -> CT FFR suggested positive findings in distal D1, D2 and distal circumflex.  Referred for cath. ==> FALSE POSITIVE   ESOPHAGOGASTRODUODENOSCOPY     eye cysts Bilateral    LASIK     LEFT HEART CATH AND CORONARY ANGIOGRAPHY N/A 05/16/2018   Procedure: LEFT HEART CATH AND CORONARY ANGIOGRAPHY;  Surgeon: Belva Crome, MD;  Location: [facility] INVASIVE CV LAB:  Angiographically normal coronary arteries with LVEDP of 21 mmHg.  Large draping hyperdominant LAD that wraps the apex and provides distal half of the PDA.  Relatively small caliber distal Cx -->proxLPDA, non-dom RCA. No Cx or Diag lesions -- FALSE + CT FFR   LEFT HEART CATH AND CORONARY ANGIOGRAPHY  2009/March 2012   2009: (Dr. Alvie Heidelberg) Nonobstructive CAD; 2012: Minimal  CAD --> false positive stress test   NM MYOVIEW LTD  01/2016; 01/2018   a)LOW RISK. No ischemia or infarction;; b) EF 55-60%. NO ST changes.  No ischemia or Infarction. NO significant RV enlargement.  LOW RISK.   Polysomnogram  02/2018   (Dr. Brett Fairy from neurology): Split study was not adequately done due to low AHI.  She slept reclined and had an AHI less than 10, prolonged hypoxemia and no hypercapnia. -->  She has home oxygen already prescribed, but did not meet criteria for nightly oxygen.  (Was noted that the patient did not cooperate well with study.  Titration was discussed.   PRESSURE SENSOR/CARDIOMEMS N/A 11/09/2019   Procedure: PRESSURE SENSOR/CARDIOMEMS;  Surgeon: Larey Dresser, MD;  Location: [place] CV LAB;  Service: Cardiovascular;  Laterality: N/A;   RECTAL POLYPECTOMY     RIGHT/LEFT HEART CATH AND CORONARY ANGIOGRAPHY N/A 04/06/2019   Procedure: RIGHT/LEFT HEART CATH AND CORONARY ANGIOGRAPHY;  Surgeon: Leonie Man, MD;  [facility] INVAS[facility] CV LAB; angiographically normal coronary arteries.  Moderately elevated AD LVEDP..  Mild pulmonary hypertension: PA P 56/14 mmHg-mean 31 mmHg.  RAP 10 mmHg.  RV P-EDP 54/4 mmHg - 12 mmHg.  PCWP 11-15 mmHg.  CO-CI: 7.35-3.73.   TONSILLECTOMY     TRANSTHORACIC ECHOCARDIOGRAM  01/2016; 05/2017   A) Normal LV chamber size with mod LVH pattern. EF 50-55%. Severe LA dilation. Mod RA dilation. PAP elevated at 38 mmHg;; B) Mild concentric LVH.  EF 55-60% & no RWMA.  Grade 2 DD.  Diastolic flattening of the ventricular septum == ? elevated PAP.  Mild aortic valve calcification/sclerosis.  Mod LA dilation.  Mild RV dilation & Mod RA dilation   TRANSTHORACIC ECHOCARDIOGRAM  10/2017; 01/2018   a) EF 55-60%.  No RWMA,  Moderate LA dilation.  Mild LA dilation.  Peak PA pressures ~57 mmHg (moderate);;; b) EF 55-60%.  GRII DD.  Suggestion of RV volume overload with moderate RV dilation.  Severe LA and RA dilation.Marland Kitchen  PA pressure ~67%.   TRANSTHORACIC  ECHOCARDIOGRAM  01/2019   Normal LV size and function.  EF 50 to 55%.  Impaired relaxation.  RV appears to have moderately reduced function.  Severely enlarged.  Cannot fully assess RV pressures.  Hypermobile interatrial septum.  Right atrium severely dilated.   TUBAL LIGATION     vaginal cyst removed     several times     OB History   No obstetric history  on file.     Family History  Problem Relation Age of Onset   Allergies Father    Heart disease Father        before 78   Heart failure Father    Hypertension Father    Hyperlipidemia Father    Heart disease Mother    Diabetes Mother    Hypertension Mother    Hyperlipidemia Mother    Hypertension Sister    Heart disease Sister        before 75   Hyperlipidemia Sister    Diabetes Brother    Hypertension Brother    Diabetes Sister    Hypertension Sister    Diabetes Son    Hypertension Son    Cancer Other     Social History   Tobacco Use   Smoking status: Former    Packs/day: 0.50    Years: 25.00    Pack years: 12.50    Types: Cigarettes   Smokeless tobacco: Never   Tobacco comments:    quit smoking 20+ytrs ago  Vaping Use   Vaping Use: Never used  Substance Use Topics   Alcohol use: No   Drug use: No    Home Medications Prior to Admission medications   Medication Sig Start Date End Date Taking? Authorizing Provider  amLODipine (NORVASC) 5 MG tablet Take 1 tablet (5 mg total) by mouth daily. 01/11/21 01/11/22 Yes Larey Dresser, MD  Ascorbic Acid (VITAMIN C) 1000 MG tablet Take 1,000 mg by mouth daily.   Yes [provider]  atorvastatin (LIPITOR) 10 MG tablet TAKE 1 TABLET EVERY EVENING. 03/23/21  Yes [name], MD  carvedilol (COREG) 25 MG tablet TAKE 1 TABLET TWICE DAILY WITH A MEAL 08/16/20  Yes Leonie Man, MD  dexlansoprazole (DEXILANT) 60 MG capsule Take 1 capsule (60 mg total) by mouth daily. Patient taking differently: Take 60 mg by mouth every evening. 11/08/20  Yes [name], MD  diclofenac sodium (VOLTAREN) 1 % GEL Apply 2 g topically 4 (four) times daily as needed (pain). 05/17/18  Yes Vann, Jessica U, DO  ferrous sulfate 324 MG TBEC Take 324 mg by mouth 2 (two) times daily.   Yes [provider]  furosemide (LASIX) 80 MG tablet TAKE 1 TABLET DAILY ON MONDAY THROUGH FRIDAY, AND 1/2 TABLET ON SATURDAY AND SUNDAY 11/24/20  Yes Larey Dresser, MD  Pancrelipase, Lip-Prot-Amyl, 25000-79000 units CPEP Take 1-2 capsules by mouth See admin instructions. Take 1 capsules in the morning with breakfast, 1 capsule with lunch, and 2 capsules with supper   Yes [provider]  potassium chloride SA (KLOR-CON) 20 MEQ tablet TAKE 2 TABLETS EVERY DAY BUT WHEN TAKING METOLAZONE TAKE 3 TABLETS DAILY AS DIRECTED Patient taking differently: Take 40 mEq by mouth daily. 08/31/20  Yes Leonie Man, MD  sertraline (ZOLOFT) 50 MG tablet TAKE 1 TABLET EVERY DAY 08/11/20  Yes [name], MD  tiotropium (SPIRIVA HANDIHALER) 18 MCG inhalation capsule Place 1 capsule (18 mcg total) into inhaler and inhale daily. 11/18/20 11/18/21 Yes [place] Eric J, DO  TOUJEO SOLOSTAR 300 UNIT/ML Solostar Pen Inject 25 Units into the skin at bedtime. 09/01/20  Yes [provider]  acetaminophen (TYLENOL) 500 MG tablet Take 500-1,000 mg by mouth every 4 (four) hours as needed for moderate pain.     [provider]  albuterol (VENTOLIN HFA) 108 (90 Base) MCG/ACT inhaler Inhale 1-2 puffs into the lungs every 6 (six) hours as needed for wheezing or  shortness of breath. Patient not taking: Reported on 02/06/2021    [provider]  Alcohol Swabs (B-D SINGLE USE SWABS REGULAR) PADS USE TWICE DAILY AS DIRECTED 08/31/20   [name], MD  B-D ULTRAFINE III SHORT PEN 31G X 8 MM MISC USE DAILY WITH VICTOZA AND/OR NOVOLOG. 07/08/20   [name], MD  benzonatate (TESSALON PERLES) 100 MG capsule Take 1 capsule (100 mg total) by mouth 3 (three) times daily as needed for  cough. 11/10/20 11/10/21  Bary Castilla, NP  Blood Glucose Calibration (TRUE METRIX LEVEL 1) Low SOLN Use as directed  Dx:e11.22 11/28/20   [name], MD  Blood Glucose Monitoring Suppl (TRUE METRIX METER) w/Device KIT Use as directed to check blood sugars 2 times per day dx: e11.22 03/07/20   [name], MD  Glucagon (GVOKE HYPOPEN 1-PACK) 0.5 MG/0.1ML SOAJ Inject 0.5 mg into the skin daily as needed. 03/27/21   [name], MD  glucose blood (TRUE METRIX BLOOD GLUCOSE TEST) test strip USE AS DIRECTED TO CHECK BLOOD SUGARS 2 TIMES PER DAY 03/07/21   [name], MD  guaiFENesin-dextromethorphan [facility] DM) 100-10 MG/5ML syrup Take 10 mLs by mouth every 6 (six) hours as needed for cough. Patient not taking: Reported on 02/06/2021 11/18/20   [place] Donnamarie Poag, DO  hydrALAZINE (APRESOLINE) 100 MG tablet Take 1 tablet (100 mg total) by mouth 3 (three) times daily. 10/21/20   Larey Dresser, MD  hydrocortisone cream 1 % Apply 1 application topically daily as needed for itching.     [provider]  insulin lispro (HUMALOG KWIKPEN) 100 UNIT/ML KwikPen Inject 0.02-0.04 mLs (2-4 Units total) into the skin 3 (three) times daily as needed (high blood sugar over 150). 11/23/20   Bary Castilla, NP  Isopropyl Alcohol (ALCOHOL WIPES) 70 % MISC Apply 1 each topically 2 (two) times daily. 06/25/19   Minette Brine, FNP  isosorbide mononitrate (IMDUR) 30 MG 24 hr tablet TAKE 1 TABLET EVERY DAY (APPOINTMENT IS NEEDED FOR REFILLS) 03/13/21   Leonie Man, MD  lamoTRIgine (LAMICTAL) 150 MG tablet Take 1 tablet (150 mg total) by mouth 2 (two) times daily. 12/09/20   [name], MD  levETIRAcetam (KEPPRA XR) 500 MG 24 hr tablet Take 3 tablets every night 10/26/20   Cameron Sprang, MD  ondansetron [facility]) 4 MG tablet Take 4 mg by mouth every 8 (eight) hours as needed for nausea or vomiting. 02/22/20   [provider]  OZEMPIC, 1 MG/DOSE, 4 MG/3ML SOPN INJECT 1 MG INTO THE SKIN ONCE A  WEEK. 11/08/20   [name], MD  sennosides-docusate sodium (SENOKOT-S) 8.6-50 MG tablet Take 1 tablet by mouth daily.    [provider]  tolnaftate (TINACTIN) 1 % cream Apply 1 application topically daily as needed (foot fungus).     [provider]  TRUEplus Lancets 33G MISC Use as directed to check blood sugars 2 times per day dx: e11.22 11/28/20   [name], MD    Allergies    Other, Penicillins, Sulfa antibiotics, Aspirin, and Codeine  Review of Systems   Review of Systems  Constitutional:  Positive for malaise/fatigue. Negative for chills, diaphoresis and fever.  HENT:  Negative for ear pain and sore throat.   Eyes:  Negative for pain and visual disturbance.  Respiratory:  Negative for cough and shortness of breath.   Cardiovascular:  Positive for syncope. Negative for chest pain and palpitations.  Gastrointestinal:  Positive for anal bleeding and blood in stool. Negative for  abdominal distention, abdominal pain, constipation, diarrhea, nausea and vomiting.  Genitourinary:  Negative for dysuria and hematuria.  Musculoskeletal:  Negative for arthralgias and back pain.  Skin:  Negative for color change and rash.  Neurological:  Positive for seizures and syncope. Negative for dizziness, focal weakness and headaches.  Psychiatric/Behavioral:  Negative for confusion.   All other systems reviewed and are negative.  Physical Exam Updated Vital Signs BP (!) 117/58   Pulse 67   Temp 98.3 F (36.8 C) (Oral)   Resp 17   Ht 5' 1"  (1.549 m)   Wt 87.5 kg   SpO2 97%   BMI 36.47 kg/m   Physical Exam Vitals and nursing note reviewed.  Constitutional:      General: She is not in acute distress.    Appearance: She is well-developed.  HENT:     Head: Normocephalic and atraumatic.     Nose: Nose normal.     Mouth/Throat:     Mouth: Mucous membranes are moist.  Eyes:     Extraocular Movements: Extraocular movements intact.     Conjunctiva/sclera: Conjunctivae  normal.     Pupils: Pupils are equal, round, and reactive to light.  Neck:     Comments: In cervical collar Cardiovascular:     Rate and Rhythm: Normal rate and regular rhythm.     Heart sounds: No murmur heard. Pulmonary:     Effort: Pulmonary effort is normal. No respiratory distress.     Breath sounds: Normal breath sounds.  Abdominal:     Palpations: Abdomen is soft.     Tenderness: There is no abdominal tenderness.  Genitourinary:    Rectum: Guaiac result positive.     Comments: Reddish-brown stool on exam Musculoskeletal:        General: Normal range of motion.     Cervical back: Neck supple. No tenderness.  Skin:    General: Skin is warm and dry.  Neurological:     General: No focal deficit present.     Mental Status: She is alert and oriented to person, place, and time.     Cranial Nerves: No cranial nerve deficit.     Sensory: No sensory deficit.     Motor: No weakness.     Coordination: Coordination normal.    ED Results / Procedures / Treatments   Labs (all labs ordered are listed, but only abnormal results are displayed) Labs Reviewed  CBC WITH DIFFERENTIAL/PLATELET - Abnormal; Notable for the following components:      Result Value   RBC 2.17 (*)    Hemoglobin 5.1 (*)    HCT 16.6 (*)    MCV 76.5 (*)    MCH 24.0 (*)    RDW 16.0 (*)    All other components within normal limits  COMPREHENSIVE METABOLIC PANEL - Abnormal; Notable for the following components:   Sodium 133 (*)    Potassium 3.3 (*)    Chloride 97 (*)    Glucose, Bld 185 (*)    Calcium 8.6 (*)    Albumin 3.2 (*)    Total Bilirubin 0.2 (*)    All other components within normal limits  URINALYSIS, ROUTINE W REFLEX MICROSCOPIC - Abnormal; Notable for the following components:   Color, Urine STRAW (*)    Specific Gravity, Urine 1.004 (*)    All other components within normal limits  POC OCCULT BLOOD, ED - Abnormal; Notable for the following components:   Fecal Occult Bld POSITIVE (*)    All  other  components within normal limits  RESP PANEL BY RT-PCR (FLU A&B, COVID) ARPGX2  MAGNESIUM  MAGNESIUM  COMPREHENSIVE METABOLIC PANEL  CBC  PROTIME-INR  HEMOGLOBIN AND HEMATOCRIT, BLOOD  PROTIME-INR  IRON AND TIBC  RETICULOCYTES  METHYLMALONIC ACID, SERUM  FOLATE  FERRITIN  PROLACTIN  HEMOGLOBIN AND HEMATOCRIT, BLOOD  RAPID URINE DRUG SCREEN, HOSP PERFORMED  ETHANOL  CBG MONITORING, ED  TYPE AND SCREEN  PREPARE RBC (CROSSMATCH)  TROPONIN I (HIGH SENSITIVITY)    EKG None  Radiology CT HEAD WO CONTRAST (5MM)  Result Date: 03/31/2021 CLINICAL DATA:  Seizure.  Followed by fall. EXAM: CT HEAD WITHOUT CONTRAST CT CERVICAL SPINE WITHOUT CONTRAST TECHNIQUE: Multidetector CT imaging of the head and cervical spine was performed following the standard protocol without intravenous contrast. Multiplanar CT image reconstructions of the cervical spine were also generated. COMPARISON:  CT head 01/28/2020, MR head 11/10/2019 FINDINGS: CT HEAD FINDINGS BRAIN: BRAIN Patchy and confluent areas of decreased attenuation are noted throughout the deep and periventricular white matter of the cerebral hemispheres bilaterally, compatible with chronic microvascular ischemic disease. No evidence of large-territorial acute infarction. No parenchymal hemorrhage. No mass lesion. No extra-axial collection. No mass effect or midline shift. No hydrocephalus. Basilar cisterns are patent. Partial empty sella. Vascular: No hyperdense vessel. Atherosclerotic calcifications are present within the cavernous internal carotid arteries. Skull: No acute fracture or focal lesion. Sinuses/Orbits: Paranasal sinuses and mastoid air cells are clear. Bilateral lens replacement. Otherwise orbits are unremarkable. Other: None. CT CERVICAL SPINE FINDINGS Alignment: Normal. Skull base and vertebrae: Multilevel degenerative changes of the spine. No acute fracture. No aggressive appearing focal osseous lesion or focal pathologic process.  Soft tissues and spinal canal: No prevertebral fluid or swelling. No visible canal hematoma. Upper chest: Biapical pleural/pulmonary scarring. Emphysematous changes. Other: None. IMPRESSION: 1. No acute intracranial abnormality. 2. No acute displaced fracture or traumatic listhesis of the cervical spine. 3.  Emphysema (ICD10-J43.9). 4. Persistent partial empty sella. Findings is often a normal anatomic variant but can be associated with idiopathic intracranial hypertension (pseudotumor cerebri). Electronically Signed   By: Iven Finn M.D.   On: 03/31/2021 22:16   CT Cervical Spine Wo Contrast  Result Date: 03/31/2021 CLINICAL DATA:  Seizure.  Followed by fall. EXAM: CT HEAD WITHOUT CONTRAST CT CERVICAL SPINE WITHOUT CONTRAST TECHNIQUE: Multidetector CT imaging of the head and cervical spine was performed following the standard protocol without intravenous contrast. Multiplanar CT image reconstructions of the cervical spine were also generated. COMPARISON:  CT head 01/28/2020, MR head 11/10/2019 FINDINGS: CT HEAD FINDINGS BRAIN: BRAIN Patchy and confluent areas of decreased attenuation are noted throughout the deep and periventricular white matter of the cerebral hemispheres bilaterally, compatible with chronic microvascular ischemic disease. No evidence of large-territorial acute infarction. No parenchymal hemorrhage. No mass lesion. No extra-axial collection. No mass effect or midline shift. No hydrocephalus. Basilar cisterns are patent. Partial empty sella. Vascular: No hyperdense vessel. Atherosclerotic calcifications are present within the cavernous internal carotid arteries. Skull: No acute fracture or focal lesion. Sinuses/Orbits: Paranasal sinuses and mastoid air cells are clear. Bilateral lens replacement. Otherwise orbits are unremarkable. Other: None. CT CERVICAL SPINE FINDINGS Alignment: Normal. Skull base and vertebrae: Multilevel degenerative changes of the spine. No acute fracture. No  aggressive appearing focal osseous lesion or focal pathologic process. Soft tissues and spinal canal: No prevertebral fluid or swelling. No visible canal hematoma. Upper chest: Biapical pleural/pulmonary scarring. Emphysematous changes. Other: None. IMPRESSION: 1. No acute intracranial abnormality. 2. No acute displaced fracture or traumatic listhesis  of the cervical spine. 3.  Emphysema (ICD10-J43.9). 4. Persistent partial empty sella. Findings is often a normal anatomic variant but can be associated with idiopathic intracranial hypertension (pseudotumor cerebri). Electronically Signed   By: Iven Finn M.D.   On: 03/31/2021 22:16    Procedures .Critical Care Performed by: Lennice Sites, DO Authorized by: Lennice Sites, DO   Critical care provider statement:    Critical care time (minutes):  42   Critical care was necessary to treat or prevent imminent or life-threatening deterioration of the following conditions:  Circulatory failure   Critical care was time spent personally by me on the following activities:  Blood draw for specimens, development of treatment plan with patient or surrogate, discussions with primary provider, discussions with consultants, evaluation of patient's response to treatment, examination of patient, obtaining history from patient or surrogate, ordering and performing treatments and interventions, ordering and review of laboratory studies, review of old charts, re-evaluation of patient's condition, pulse oximetry and ordering and review of radiographic studies   Care discussed with: admitting provider     Medications Ordered in ED Medications  acetaminophen (TYLENOL) tablet 650 mg (has no administration in time range)    Or  acetaminophen (TYLENOL) suppository 650 mg (has no administration in time range)  LORazepam (ATIVAN) injection 1 mg (has no administration in time range)  levETIRAcetam (KEPPRA) 750 mg in sodium chloride 0.9 % 100 mL IVPB (has no administration  in time range)  pantoprazole (PROTONIX) injection 40 mg (has no administration in time range)  insulin aspart (novoLOG) injection 0-6 Units (has no administration in time range)  insulin glargine-yfgn (SEMGLEE) injection 6 Units (has no administration in time range)  potassium chloride 10 mEq in 100 mL IVPB (has no administration in time range)  umeclidinium bromide (INCRUSE ELLIPTA) 62.5 MCG/INH 1 puff (has no administration in time range)  0.9 %  sodium chloride infusion (10 mL/hr Intravenous New Bag/Given 03/31/21 2256)    ED Course  I have reviewed the triage vital signs and the nursing notes.  Pertinent labs & imaging results that were available during my care of the patient were reviewed by me and considered in my medical decision making (see chart for details).    MDM Rules/Calculators/A&P                           GEETIKA LABORDE is here for rectal bleeding, syncope/seizures.  Normal vitals.  No fever.  History of seizures, Parkinson's, GI bleed, hypertension, high cholesterol.  Not on blood thinners.  Overall unremarkable vitals.  No fever.  Slumped over in her chair several times at dinner tonight.  [place] and hit her head.  Arrives in cervical collar.  Neurologically she is intact.  She states that she has had bright red blood clots in her stool the last several days.  She has brownish-red stool on exam and Hemoccult positive stool.  Overall suspect syncopal episode likely secondary to anemia/GI bleed.  Hemoglobin was confirmed to be low at 5.1.  She was given 2 units of packed red blood cells and transfusion.  Head and neck CT are unremarkable.  Lab work otherwise unremarkable.  Overall suspect syncope episodes today in the setting of GI bleed.  Dr. Therisa Doyne with [facility] is made aware as patient primarily follows with Dr. Paulita Fujita.  Overall suspect diverticular bleed.  Admitted to medicine in stable condition.  This chart was dictated using voice recognition software.  Despite best efforts  to proofread,  errors can occur which can change the documentation meaning.   Final Clinical Impression(s) / ED Diagnoses Final diagnoses:  Gastrointestinal hemorrhage, unspecified gastrointestinal hemorrhage type    Rx / DC Orders ED Discharge Orders     None        Lennice Sites, DO 04/01/21 0038

## 2021-04-01 ENCOUNTER — Observation Stay (HOSPITAL_COMMUNITY): Payer: Medicare HMO

## 2021-04-01 DIAGNOSIS — K922 Gastrointestinal hemorrhage, unspecified: Secondary | ICD-10-CM | POA: Diagnosis not present

## 2021-04-01 DIAGNOSIS — E876 Hypokalemia: Secondary | ICD-10-CM | POA: Diagnosis present

## 2021-04-01 DIAGNOSIS — D649 Anemia, unspecified: Secondary | ICD-10-CM | POA: Diagnosis present

## 2021-04-01 DIAGNOSIS — I5032 Chronic diastolic (congestive) heart failure: Secondary | ICD-10-CM | POA: Diagnosis not present

## 2021-04-01 DIAGNOSIS — R4020 Unspecified coma: Secondary | ICD-10-CM | POA: Diagnosis not present

## 2021-04-01 DIAGNOSIS — D62 Acute posthemorrhagic anemia: Secondary | ICD-10-CM | POA: Diagnosis present

## 2021-04-01 DIAGNOSIS — Z8719 Personal history of other diseases of the digestive system: Secondary | ICD-10-CM | POA: Diagnosis not present

## 2021-04-01 DIAGNOSIS — I517 Cardiomegaly: Secondary | ICD-10-CM | POA: Diagnosis not present

## 2021-04-01 LAB — COMPREHENSIVE METABOLIC PANEL
ALT: 14 U/L (ref 0–44)
AST: 17 U/L (ref 15–41)
Albumin: 3.1 g/dL — ABNORMAL LOW (ref 3.5–5.0)
Alkaline Phosphatase: 88 U/L (ref 38–126)
Anion gap: 8 (ref 5–15)
BUN: 13 mg/dL (ref 8–23)
CO2: 27 mmol/L (ref 22–32)
Calcium: 8.8 mg/dL — ABNORMAL LOW (ref 8.9–10.3)
Chloride: 103 mmol/L (ref 98–111)
Creatinine, Ser: 0.67 mg/dL (ref 0.44–1.00)
GFR, Estimated: >60 mL/min (ref >60)
Glucose, Bld: 84 mg/dL (ref 70–99)
Potassium: 3.8 mmol/L (ref 3.5–5.1)
Sodium: 138 mmol/L (ref 135–145)
Total Bilirubin: 1.5 mg/dL — ABNORMAL HIGH (ref 0.3–1.2)
Total Protein: 6.6 g/dL (ref 6.5–8.1)

## 2021-04-01 LAB — CBC
HCT: 29 % — ABNORMAL LOW (ref 36.0–46.0)
HCT: 29.5 % — ABNORMAL LOW (ref 36.0–46.0)
Hemoglobin: 9.4 g/dL — ABNORMAL LOW (ref 12.0–15.0)
Hemoglobin: 9.3 g/dL — ABNORMAL LOW (ref 12.0–15.0)
MCH: 24.9 pg — ABNORMAL LOW (ref 26.0–34.0)
MCH: 24.3 pg — ABNORMAL LOW (ref 26.0–34.0)
MCHC: 32.4 g/dL (ref 30.0–36.0)
MCHC: 31.5 g/dL (ref 30.0–36.0)
MCV: 76.9 fL — ABNORMAL LOW (ref 80.0–100.0)
MCV: 77.2 fL — ABNORMAL LOW (ref 80.0–100.0)
Platelets: 252 10*3/uL (ref 150–400)
Platelets: 258 10*3/uL (ref 150–400)
RBC: 3.77 MIL/uL — ABNORMAL LOW (ref 3.87–5.11)
RBC: 3.82 MIL/uL — ABNORMAL LOW (ref 3.87–5.11)
RDW: 16.6 % — ABNORMAL HIGH (ref 11.5–15.5)
RDW: 16.6 % — ABNORMAL HIGH (ref 11.5–15.5)
WBC: 4.2 10*3/uL (ref 4.0–10.5)
WBC: 4.9 10*3/uL (ref 4.0–10.5)
nRBC: 0 % (ref 0.0–0.2)
nRBC: 0 % (ref 0.0–0.2)

## 2021-04-01 LAB — RESP PANEL BY RT-PCR (FLU A&B, COVID) ARPGX2
Influenza A by PCR: NEGATIVE
Influenza B by PCR: NEGATIVE
SARS Coronavirus 2 by RT PCR: NEGATIVE

## 2021-04-01 LAB — TROPONIN I (HIGH SENSITIVITY)
Troponin I (High Sensitivity): 10 ng/L (ref <18)
Troponin I (High Sensitivity): 9 ng/L (ref <18)

## 2021-04-01 LAB — GLUCOSE, CAPILLARY
Glucose-Capillary: 89 mg/dL (ref 70–99)
Glucose-Capillary: 178 mg/dL — ABNORMAL HIGH (ref 70–99)

## 2021-04-01 LAB — CBG MONITORING, ED
Glucose-Capillary: 136 mg/dL — ABNORMAL HIGH (ref 70–99)
Glucose-Capillary: 81 mg/dL (ref 70–99)
Glucose-Capillary: 89 mg/dL (ref 70–99)

## 2021-04-01 LAB — PROTIME-INR
INR: 1.2 (ref 0.8–1.2)
Prothrombin Time: 14.8 seconds (ref 11.4–15.2)

## 2021-04-01 LAB — MAGNESIUM: Magnesium: 2 mg/dL (ref 1.7–2.4)

## 2021-04-01 MED ORDER — POLYETHYLENE GLYCOL 3350 17 G PO PACK
17.0000 g | PACK | Freq: Three times a day (TID) | ORAL | Status: DC
Start: 2021-04-01 — End: 2021-04-02
  Administered 2021-04-01 (×3): 17 g via ORAL
  Filled 2021-04-01 (×3): qty 1

## 2021-04-01 MED ORDER — POTASSIUM CHLORIDE CRYS ER 20 MEQ PO TBCR
20.0000 meq | EXTENDED_RELEASE_TABLET | Freq: Once | ORAL | Status: AC
  Administered 2021-04-01: 20 meq via ORAL
  Filled 2021-04-01: qty 1

## 2021-04-01 MED ORDER — LEVETIRACETAM ER 500 MG PO TB24
500.0000 mg | ORAL_TABLET | Freq: Two times a day (BID) | ORAL | Status: DC
  Administered 2021-04-01 – 2021-04-03 (×5): 500 mg via ORAL
  Filled 2021-04-01 (×6): qty 1

## 2021-04-01 MED ORDER — LAMOTRIGINE 100 MG PO TABS
150.0000 mg | ORAL_TABLET | Freq: Two times a day (BID) | ORAL | Status: DC
  Administered 2021-04-01 – 2021-04-03 (×5): 150 mg via ORAL
  Filled 2021-04-01 (×2): qty 1
  Filled 2021-04-01 (×2): qty 2
  Filled 2021-04-01: qty 1
  Filled 2021-04-01 (×2): qty 2

## 2021-04-01 MED ORDER — MUSCLE RUB 10-15 % EX CREA
TOPICAL_CREAM | CUTANEOUS | Status: DC | PRN
  Filled 2021-04-01: qty 85

## 2021-04-01 MED ORDER — PEG 3350-KCL-NA BICARB-NACL 420 G PO SOLR
4000.0000 mL | Freq: Once | ORAL | Status: AC
  Administered 2021-04-02: 4000 mL via ORAL
  Filled 2021-04-01 (×2): qty 4000

## 2021-04-01 MED ORDER — INSULIN ASPART 100 UNIT/ML IJ SOLN: 0.0000 [IU] | Freq: Three times a day (TID) | INTRAMUSCULAR | Status: DC

## 2021-04-01 NOTE — Consult Note (Signed)
[facility] Consult  Referring Provider: [facility] hospitalist Primary Care Physician:  [name], MD Primary Gastroenterologist: Dr. Paulita Fujita  Reason for Consultation: Hematochezia, symptomatic anemia  HPI: [name] is a 75 y.o. female states she was in her usual state of health until yesterday evening when after finishing dinner she felt a darkness and does not remember events thereafter until she was brought to the ER.  As per documentation patient lost consciousness, fell to the floor and was brought to [facility] ER by EMS.  Patient states that since October 3 she had several episodes of rectal bleeding, described as bright red blood with clots which lasted for a few days. Last bowel movement was yesterday. She denies abdominal pain. She denies nausea or vomiting. She has mild acid reflux which is well controlled on Dexilant. She denies difficulty swallowing or pain on swallowing. She reports weight loss of 65 pounds over the past 1 year, partly intentional, partly related to diabetes. Patient does not take aspirin or NSAIDs. She is on oral iron for history of chronic iron deficiency anemia and reports constipation for which she takes senna on a regular basis.  Patient states she lives at home, uses a walker or a cane for ambulation.  Last colonoscopy was performed as an inpatient in 12/18, which showed pandiverticulosis and ulcerations in cecum and ileocecal valve, biopsies reported as acute colitis, nonspecific.  Colonoscopy in 2014: Left-sided diverticulosis, tubular adenoma removed from descending colon  EUS in 10/2010: Pancreatitis, pancreatic cyst  Past Medical History:  Diagnosis Date   Abnormal liver function     in the past.   Anemia    Arthritis    all over   Bruises easily    Cataract    right eye;immature   CHF (congestive heart failure) (HCC)    Chronic back pain    stenosis   Chronic cough    Chronic kidney disease    COPD (chronic  obstructive pulmonary disease) ([place])    COVID    aug/sept 2021   Demand myocardial infarction [facility]) 2012   [place] Infarction in setting of Pancreatitis --> mild Troponin elevation, NON-OBSTRUCTIVE CAD   Depression    takes Abilify daily as well as Zoloft   Diastolic heart failure 4259   Grade 1 diastolic Dysfunction by Echo    Diverticulosis    DM (diabetes mellitus) ([place])    takes Victoza daily as well as Lantus and Humalog   Empty sella ([place])    on MRI in 2009.   Glaucoma    Headache(784.0)    last migraine-4-52yr ago   History of blood transfusion    no abnormal reaction noted   History of colon polyps    benign   HTN (hypertension)    takes Benicar,Imdur,and Bystolic daily   Hyperlipidemia    takes Lipitor daily   Joint swelling    Nocturia    [place]    Obstructive sleep apnea 02/2018   Notably improved split-night study with weight loss from 270 (2017) down to 250 pounds (2019).   Pancreatitis    takes Pancrelipase daily   [place]'s [place]    takes Sinemet daily   Peripheral neuropathy    Pneumonia 2012   Seizures ([place]    takes Lamictal daily and Primidone nightly;last seizure 2wks ago   Urinary urgency    With increased frequency   Varicose veins of both lower extremities with pain    With edema.  Takes daily Lasix  Past Surgical History:  Procedure Laterality Date   ABDOMINAL HYSTERECTOMY     APPENDECTOMY     BACK SURGERY     Cardiac Event Monitor  September-October 2017   Sinus rhythm with occasional PACs and artifact. No arrhythmias besides one short run of tachycardia.   CHOLECYSTECTOMY     COLONOSCOPY N/A 08/15/2012   Procedure: COLONOSCOPY;  Surgeon: Arta Silence, MD;  Location: [facility];  Service: Endoscopy;  Laterality: N/A;   COLONOSCOPY WITH PROPOFOL Left 06/17/2017   Procedure: COLONOSCOPY WITH PROPOFOL;  Surgeon: [name], MD;  Location: [facility];  Service: Gastroenterology;  Laterality:  Left;   CORONARY CALCIUM SCORE AND CTA  04/2018   Coronary calcium score 71.9.  Very large, hyperdynamic LAD wrapping apex giving rise to PDA.  Moderate LAD-diagonal and circumflex plaque -> CT FFR suggested positive findings in distal D1, D2 and distal circumflex.  Referred for cath. ==> FALSE POSITIVE   ESOPHAGOGASTRODUODENOSCOPY     eye cysts Bilateral    LASIK     LEFT HEART CATH AND CORONARY ANGIOGRAPHY N/A 05/16/2018   Procedure: LEFT HEART CATH AND CORONARY ANGIOGRAPHY;  Surgeon: Belva Crome, MD;  Location: [facility] INVASIVE CV LAB:  Angiographically normal coronary arteries with LVEDP of 21 mmHg.  Large draping hyperdominant LAD that wraps the apex and provides distal half of the PDA.  Relatively small caliber distal Cx -->proxLPDA, non-dom RCA. No Cx or Diag lesions -- FALSE + CT FFR   LEFT HEART CATH AND CORONARY ANGIOGRAPHY  2009/March 2012   2009: (Dr. Alvie Heidelberg) Nonobstructive CAD; 2012: Minimal CAD --> false positive stress test   NM MYOVIEW LTD  01/2016; 01/2018   a)LOW RISK. No ischemia or infarction;; b) EF 55-60%. NO ST changes.  No ischemia or Infarction. NO significant RV enlargement.  LOW RISK.   Polysomnogram  02/2018   (Dr. Brett Fairy from neurology): Split study was not adequately done due to low AHI.  She slept reclined and had an AHI less than 10, prolonged hypoxemia and no hypercapnia. -->  She has home oxygen already prescribed, but did not meet criteria for nightly oxygen.  (Was noted that the patient did not cooperate well with study.  Titration was discussed.   PRESSURE SENSOR/CARDIOMEMS N/A 11/09/2019   Procedure: PRESSURE SENSOR/CARDIOMEMS;  Surgeon: Larey Dresser, MD;  Location: [facility];  Service: Cardiovascular;  Laterality: N/A;   RECTAL POLYPECTOMY     RIGHT/LEFT HEART CATH AND CORONARY ANGIOGRAPHY N/A 04/06/2019   Procedure: RIGHT/LEFT HEART CATH AND CORONARY ANGIOGRAPHY;  Surgeon: Leonie Man, MD;  Location: [facility] INVASIVE CV LAB; angiographically  normal coronary arteries.  Moderately elevated AD LVEDP..  Mild pulmonary hypertension: PA P 56/14 mmHg-mean 31 mmHg.  RAP 10 mmHg.  RV P-EDP 54/4 mmHg - 12 mmHg.  PCWP 11-15 mmHg.  CO-CI: 7.35-3.73.   TONSILLECTOMY     TRANSTHORACIC ECHOCARDIOGRAM  01/2016; 05/2017   A) Normal LV chamber size with mod LVH pattern. EF 50-55%. Severe LA dilation. Mod RA dilation. PAP elevated at 38 mmHg;; B) Mild concentric LVH.  EF 55-60% & no RWMA.  Grade 2 DD.  Diastolic flattening of the ventricular septum == ? elevated PAP.  Mild aortic valve calcification/sclerosis.  Mod LA dilation.  Mild RV dilation & Mod RA dilation   TRANSTHORACIC ECHOCARDIOGRAM  10/2017; 01/2018   a) EF 55-60%.  No RWMA,  Moderate LA dilation.  Mild LA dilation.  Peak PA pressures ~57 mmHg (moderate);;; b) EF 55-60%.  GRII DD.  Suggestion of RV volume overload with moderate RV dilation.  Severe LA and RA dilation.Marland Kitchen  PA pressure ~67%.   TRANSTHORACIC ECHOCARDIOGRAM  01/2019   Normal LV size and function.  EF 50 to 55%.  Impaired relaxation.  RV appears to have moderately reduced function.  Severely enlarged.  Cannot fully assess RV pressures.  Hypermobile interatrial septum.  Right atrium severely dilated.   TUBAL LIGATION     vaginal cyst removed     several times    Prior to Admission medications   Medication Sig Start Date End Date Taking? Authorizing Provider  acetaminophen (TYLENOL) 500 MG tablet Take 500-1,000 mg by mouth every 4 (four) hours as needed for moderate pain.    Yes [provider]  albuterol (VENTOLIN HFA) 108 (90 Base) MCG/ACT inhaler Inhale 1-2 puffs into the lungs every 6 (six) hours as needed for wheezing or shortness of breath.   Yes [provider]  amLODipine (NORVASC) 5 MG tablet Take 1 tablet (5 mg total) by mouth daily. 01/11/21 01/11/22 Yes Larey Dresser, MD  Ascorbic Acid (VITAMIN C) 1000 MG tablet Take 1,000 mg by mouth daily.   Yes [provider]  atorvastatin (LIPITOR) 10 MG  tablet TAKE 1 TABLET EVERY EVENING. 03/23/21  Yes [name], MD  carvedilol (COREG) 25 MG tablet TAKE 1 TABLET TWICE DAILY WITH A MEAL 08/16/20  Yes Leonie Man, MD  dexlansoprazole (DEXILANT) 60 MG capsule Take 1 capsule (60 mg total) by mouth daily. Patient taking differently: Take 60 mg by mouth every evening. 11/08/20  Yes [name], MD  diclofenac sodium (VOLTAREN) 1 % GEL Apply 2 g topically 4 (four) times daily as needed (pain). 05/17/18  Yes Vann, Jessica U, DO  ferrous sulfate 324 MG TBEC Take 324 mg by mouth 2 (two) times daily.   Yes [provider]  furosemide (LASIX) 80 MG tablet TAKE 1 TABLET DAILY ON MONDAY THROUGH FRIDAY, AND 1/2 TABLET ON SATURDAY AND SUNDAY 11/24/20  Yes Larey Dresser, MD  hydrALAZINE (APRESOLINE) 100 MG tablet Take 1 tablet (100 mg total) by mouth 3 (three) times daily. 10/21/20  Yes Larey Dresser, MD  hydrocortisone cream 1 % Apply 1 application topically daily as needed for itching.    Yes [provider]  insulin lispro (HUMALOG KWIKPEN) 100 UNIT/ML KwikPen Inject 0.02-0.04 mLs (2-4 Units total) into the skin 3 (three) times daily as needed (high blood sugar over 150). 11/23/20  Yes Ghumman, Ramandeep, NP  isosorbide mononitrate (IMDUR) 30 MG 24 hr tablet TAKE 1 TABLET EVERY DAY (APPOINTMENT IS NEEDED FOR REFILLS) 03/13/21  Yes Leonie Man, MD  lamoTRIgine (LAMICTAL) 150 MG tablet Take 1 tablet (150 mg total) by mouth 2 (two) times daily. 12/09/20  Yes [name], MD  levETIRAcetam (KEPPRA XR) 500 MG 24 hr tablet Take 3 tablets every night Patient taking differently: Take 500 mg by mouth in the morning and at bedtime. 10/26/20  Yes Cameron Sprang, MD  ondansetron (ZOFRAN) 4 MG tablet Take 4 mg by mouth every 8 (eight) hours as needed for nausea or vomiting. 02/22/20  Yes [provider]  OZEMPIC, 1 MG/DOSE, 4 MG/3ML SOPN INJECT 1 MG INTO THE SKIN ONCE A WEEK. 11/08/20  Yes [name], MD  Pancrelipase,  Lip-Prot-Amyl, 25000-79000 units CPEP Take 1-2 capsules by mouth See admin instructions. Take 1 capsules in the morning with breakfast, 1 capsule with lunch, and 2 capsules with supper   Yes [provider]  potassium chloride SA (KLOR-CON) 20 MEQ tablet TAKE 2 TABLETS EVERY DAY BUT WHEN TAKING METOLAZONE TAKE 3 TABLETS DAILY AS DIRECTED Patient taking differently: Take 40 mEq by mouth daily. 08/31/20  Yes Leonie Man, MD  sennosides-docusate sodium (SENOKOT-S) 8.6-50 MG tablet Take 1-2 tablets by mouth at bedtime as needed for constipation.   Yes [provider]  sertraline (ZOLOFT) 50 MG tablet TAKE 1 TABLET EVERY DAY 08/11/20  Yes [name], MD  tiotropium (SPIRIVA HANDIHALER) 18 MCG inhalation capsule Place 1 capsule (18 mcg total) into inhaler and inhale daily. 11/18/20 11/18/21 Yes [place] Eric J, DO  tolnaftate (TINACTIN) 1 % cream Apply 1 application topically daily as needed (foot fungus).    Yes [provider]  TOUJEO SOLOSTAR 300 UNIT/ML Solostar Pen Inject 25 Units into the skin at bedtime. 09/01/20  Yes [provider]  Alcohol Swabs (B-D SINGLE USE SWABS REGULAR) PADS USE TWICE DAILY AS DIRECTED 08/31/20   [name], MD  B-D ULTRAFINE III SHORT PEN 31G X 8 MM MISC USE DAILY WITH VICTOZA AND/OR NOVOLOG. 07/08/20   [name], MD  benzonatate (TESSALON PERLES) 100 MG capsule Take 1 capsule (100 mg total) by mouth 3 (three) times daily as needed for cough. Patient not taking: Reported on 04/01/2021 11/10/20 11/10/21  Bary Castilla, NP  Blood Glucose Calibration (TRUE METRIX LEVEL 1) Low SOLN Use as directed  Dx:e11.22 11/28/20   [name], MD  Blood Glucose Monitoring Suppl (TRUE METRIX METER) w/Device KIT Use as directed to check blood sugars 2 times per day dx: e11.22 03/07/20   [name], MD  Glucagon (GVOKE HYPOPEN 1-PACK) 0.5 MG/0.1ML SOAJ Inject 0.5 mg into the skin daily as needed. 03/27/21   [name], MD  glucose blood  (TRUE METRIX BLOOD GLUCOSE TEST) test strip USE AS DIRECTED TO CHECK BLOOD SUGARS 2 TIMES PER DAY 03/07/21   [name], MD  guaiFENesin-dextromethorphan [facility] DM) 100-10 MG/5ML syrup Take 10 mLs by mouth every 6 (six) hours as needed for cough. Patient not taking: No sig reported 11/18/20   [place] Eric J, DO  Isopropyl Alcohol (ALCOHOL WIPES) 70 % MISC Apply 1 each topically 2 (two) times daily. 06/25/19   Minette Brine, FNP  TRUEplus Lancets 33G MISC Use as directed to check blood sugars 2 times per day dx: e11.22 11/28/20   [name], MD    Current Facility-Administered Medications  Medication Dose Route Frequency Provider Last Rate Last Admin   acetaminophen (TYLENOL) tablet 650 mg  650 mg Oral Q6H PRN Howerter, Justin B, DO       Or   acetaminophen (TYLENOL) suppository 650 mg  650 mg Rectal Q6H PRN Howerter, Justin B, DO       insulin aspart (novoLOG) injection 0-6 Units  0-6 Units Subcutaneous Q6H Howerter, Justin B, DO       insulin glargine-yfgn (SEMGLEE) injection 6 Units  6 Units Subcutaneous QHS Howerter, Justin B, DO   6 Units at 04/01/21 0109   levETIRAcetam (KEPPRA) 750 mg in sodium chloride 0.9 % 100 mL IVPB  750 mg Intravenous Q12H Howerter, Justin B, DO   Stopped at 04/01/21 0921   LORazepam (ATIVAN) injection 1 mg  1 mg Intravenous Q2H PRN Howerter, Justin B, DO       pantoprazole (PROTONIX) injection 40 mg  40 mg Intravenous Q24H Howerter, Justin B, DO   40 mg at 04/01/21 0109   polyethylene glycol (MIRALAX / GLYCOLAX) packet 17 g  17 g Oral TID  [name], MD   17 g at 04/01/21 0939   [START ON 04/02/2021] polyethylene glycol-electrolytes (NuLYTELY) solution 4,000 mL  4,000 mL Oral Once [name], MD       umeclidinium bromide (INCRUSE ELLIPTA) 62.5 MCG/INH 1 puff  1 puff Inhalation Daily Howerter, Justin B, DO   1 puff at 04/01/21 0808   Current Outpatient Medications  Medication Sig Dispense Refill   acetaminophen (TYLENOL) 500 MG tablet Take 500-1,000 mg  by mouth every 4 (four) hours as needed for moderate pain.      albuterol (VENTOLIN HFA) 108 (90 Base) MCG/ACT inhaler Inhale 1-2 puffs into the lungs every 6 (six) hours as needed for wheezing or shortness of breath.     amLODipine (NORVASC) 5 MG tablet Take 1 tablet (5 mg total) by mouth daily. 30 tablet 11   Ascorbic Acid (VITAMIN C) 1000 MG tablet Take 1,000 mg by mouth daily.     atorvastatin (LIPITOR) 10 MG tablet TAKE 1 TABLET EVERY EVENING. 90 tablet 2   carvedilol (COREG) 25 MG tablet TAKE 1 TABLET TWICE DAILY WITH A MEAL 180 tablet 2   dexlansoprazole (DEXILANT) 60 MG capsule Take 1 capsule (60 mg total) by mouth daily. (Patient taking differently: Take 60 mg by mouth every evening.) 90 capsule 2   diclofenac sodium (VOLTAREN) 1 % GEL Apply 2 g topically 4 (four) times daily as needed (pain).     ferrous sulfate 324 MG TBEC Take 324 mg by mouth 2 (two) times daily.     furosemide (LASIX) 80 MG tablet TAKE 1 TABLET DAILY ON MONDAY THROUGH FRIDAY, AND 1/2 TABLET ON SATURDAY AND SUNDAY 78 tablet 3   hydrALAZINE (APRESOLINE) 100 MG tablet Take 1 tablet (100 mg total) by mouth 3 (three) times daily. 270 tablet 3   hydrocortisone cream 1 % Apply 1 application topically daily as needed for itching.      insulin lispro (HUMALOG KWIKPEN) 100 UNIT/ML KwikPen Inject 0.02-0.04 mLs (2-4 Units total) into the skin 3 (three) times daily as needed (high blood sugar over 150). 15 mL 2   isosorbide mononitrate (IMDUR) 30 MG 24 hr tablet TAKE 1 TABLET EVERY DAY (APPOINTMENT IS NEEDED FOR REFILLS) 90 tablet 2   lamoTRIgine (LAMICTAL) 150 MG tablet Take 1 tablet (150 mg total) by mouth 2 (two) times daily. 180 tablet 3   levETIRAcetam (KEPPRA XR) 500 MG 24 hr tablet Take 3 tablets every night (Patient taking differently: Take 500 mg by mouth in the morning and at bedtime.) 270 tablet 3   ondansetron (ZOFRAN) 4 MG tablet Take 4 mg by mouth every 8 (eight) hours as needed for nausea or vomiting.     OZEMPIC, 1  MG/DOSE, 4 MG/3ML SOPN INJECT 1 MG INTO THE SKIN ONCE A WEEK. 9 mL 3   Pancrelipase, Lip-Prot-Amyl, 25000-79000 units CPEP Take 1-2 capsules by mouth See admin instructions. Take 1 capsules in the morning with breakfast, 1 capsule with lunch, and 2 capsules with supper     potassium chloride SA (KLOR-CON) 20 MEQ tablet TAKE 2 TABLETS EVERY DAY BUT WHEN TAKING METOLAZONE TAKE 3 TABLETS DAILY AS DIRECTED (Patient taking differently: Take 40 mEq by mouth daily.) 204 tablet 2   sennosides-docusate sodium (SENOKOT-S) 8.6-50 MG tablet Take 1-2 tablets by mouth at bedtime as needed for constipation.     sertraline (ZOLOFT) 50 MG tablet TAKE 1 TABLET EVERY DAY 90 tablet 2   tiotropium (SPIRIVA HANDIHALER) 18 MCG inhalation capsule Place 1 capsule (18 mcg  total) into inhaler and inhale daily. 90 capsule 3   tolnaftate (TINACTIN) 1 % cream Apply 1 application topically daily as needed (foot fungus).      TOUJEO SOLOSTAR 300 UNIT/ML Solostar Pen Inject 25 Units into the skin at bedtime.     Alcohol Swabs (B-D SINGLE USE SWABS REGULAR) PADS USE TWICE DAILY AS DIRECTED 200 each 6   B-D ULTRAFINE III SHORT PEN 31G X 8 MM MISC USE DAILY WITH VICTOZA AND/OR NOVOLOG. 400 each 1   benzonatate (TESSALON PERLES) 100 MG capsule Take 1 capsule (100 mg total) by mouth 3 (three) times daily as needed for cough. (Patient not taking: Reported on 04/01/2021) 30 capsule 1   Blood Glucose Calibration (TRUE METRIX LEVEL 1) Low SOLN Use as directed  Dx:e11.22 1 each 2   Blood Glucose Monitoring Suppl (TRUE METRIX METER) w/Device KIT Use as directed to check blood sugars 2 times per day dx: e11.22 1 kit 1   Glucagon (GVOKE HYPOPEN 1-PACK) 0.5 MG/0.1ML SOAJ Inject 0.5 mg into the skin daily as needed. 0.1 mL 11   glucose blood (TRUE METRIX BLOOD GLUCOSE TEST) test strip USE AS DIRECTED TO CHECK BLOOD SUGARS 2 TIMES PER DAY 200 strip 2   guaiFENesin-dextromethorphan (ROBITUSSIN DM) 100-10 MG/5ML syrup Take 10 mLs by mouth every 6 (six)  hours as needed for cough. (Patient not taking: No sig reported) 118 mL 0   Isopropyl Alcohol (ALCOHOL WIPES) 70 % MISC Apply 1 each topically 2 (two) times daily. 300 each 3   TRUEplus Lancets 33G MISC Use as directed to check blood sugars 2 times per day dx: e11.22 300 each 2    Allergies as of 03/31/2021 - Review Complete 03/31/2021  Allergen Reaction Noted   Other Anaphylaxis and Rash 05/18/2012   Penicillins Hives 10/26/2010   Sulfa antibiotics Hives 10/26/2010   Aspirin Other (See Comments) 07/12/2017   Codeine  10/26/2010    Family History  Problem Relation Age of Onset   Allergies Father    Heart disease Father        before 30   Heart failure Father    Hypertension Father    Hyperlipidemia Father    Heart disease Mother    Diabetes Mother    Hypertension Mother    Hyperlipidemia Mother    Hypertension Sister    Heart disease Sister        before 53   Hyperlipidemia Sister    Diabetes Brother    Hypertension Brother    Diabetes Sister    Hypertension Sister    Diabetes Son    Hypertension Son    Cancer Other     Social History   Socioeconomic History   Marital status: Divorced    Spouse name: Not on file   Number of children: Not on file   Years of education: Not on file   Highest education level: Not on file  Occupational History   Occupation: Disabled    Comment: CNA  Tobacco Use   Smoking status: Former    Packs/day: 0.50    Years: 25.00    Pack years: 12.50    Types: Cigarettes   Smokeless tobacco: Never   Tobacco comments:    quit smoking 20+ytrs ago  Vaping Use   Vaping Use: Never used  Substance and Sexual Activity   Alcohol use: No   Drug use: No   Sexual activity: Not Currently    Birth control/protection: Surgical  Other Topics Concern   Not  on file  Social History Narrative   She lives in [place]. She has lots of family in the area and is accompanied by her sister.   She is a retired [employer] manager.  Disabled secondary to recurrent  seizure activity.   She has a distant history of smoking, quit 20 years ago.   Social Determinants of Health   Financial Resource Strain: Low Risk    Difficulty of Paying Living Expenses: Not hard at all  Food Insecurity: No Food Insecurity   Worried About Charity fundraiser in the Last Year: Never true   [place] in the Last Year: Never true  Transportation Needs: No Transportation Needs   Lack of Transportation (Medical): No   Lack of Transportation (Non-Medical): No  Physical Activity: Inactive   Days of Exercise per Week: 0 days   Minutes of Exercise per Session: 0 min  Stress: No Stress Concern Present   Feeling of Stress : Not at all  Social Connections: Not on file  Intimate Partner Violence: Not on file    Review of Systems: Positive for: GI: Described in detail in HPI.    Gen: involuntary weight loss, denies any fever, chills, rigors, night sweats, anorexia, fatigue, weakness, malaise  and sleep disorder CV: chest pain, denies  angina, palpitations, syncope, orthopnea, PND, peripheral edema, and claudication. Resp: Denies dyspnea, cough, sputum, wheezing, coughing up blood. GU : Denies urinary burning, blood in urine, urinary frequency, urinary hesitancy, nocturnal urination, and urinary incontinence. MS: Denies joint pain or swelling.  Denies muscle weakness, cramps, atrophy.  Derm: Denies rash, itching, oral ulcerations, hives, unhealing ulcers.  Psych: Denies depression, anxiety, memory loss, suicidal ideation, hallucinations,  and confusion. Heme: Denies bruising and enlarged lymph nodes. Neuro:  dizziness, denies any headaches,  paresthesias. Endo:  DM, denies any problems with thyroid, adrenal function.  Physical Exam: Vital signs in last 24 hours: Temp:  [97.6 F (36.4 C)-98.7 F (37.1 C)] 98.7 F (37.1 C) (10/08 0900) Pulse Rate:  [63-81] 67 (10/08 0900) Resp:  [12-20] 18 (10/08 0900) BP: (102-134)/(47-89) 129/58 (10/08 0900) SpO2:  [95 %-100 %] 98  % (10/08 0700) Weight:  [87.5 kg] 87.5 kg (10/07 2049)    General:   Alert,  Well-developed, well-nourished, pleasant and cooperative in NAD Head:  Normocephalic and atraumatic. Eyes:  Sclera clear, no icterus.   Prominent pallor Ears:  Normal auditory acuity. Nose:  No deformity, discharge,  or lesions. Mouth:  No deformity or lesions.  Oropharynx pink & moist. Neck:  Supple; no masses or thyromegaly. Lungs:  Clear throughout to auscultation.   No wheezes, crackles, or rhonchi. No acute distress. Heart:  Regular rate and rhythm; no murmurs, clicks, rubs,  or gallops. Extremities:  Without clubbing or edema. Neurologic:  Alert and  oriented x4;  grossly normal neurologically. Skin:  Intact without significant lesions or rashes. Psych:  Alert and cooperative. Normal mood and affect. Abdomen:  Soft, mild periumbilical tenderness and nondistended. No masses, hepatosplenomegaly or hernias noted. Normal bowel sounds, without guarding, and without rebound.         Lab Results: Recent Labs    03/31/21 2044 04/01/21 0855  WBC 5.3 4.2  HGB 5.1* 9.4*  HCT 16.6* 29.0*  PLT 262 252   BMET Recent Labs    03/31/21 2044  NA 133*  K 3.3*  CL 97*  CO2 25  GLUCOSE 185*  BUN 14  CREATININE 0.88  CALCIUM 8.6*   LFT Recent Labs  03/31/21 2044  PROT 6.6  ALBUMIN 3.2*  AST 20  ALT 14  ALKPHOS 102  BILITOT 0.2*   PT/INR Recent Labs    04/01/21 0855  LABPROT 14.8  INR 1.2    Studies/Results: CT HEAD WO CONTRAST (5MM)  Result Date: 03/31/2021 CLINICAL DATA:  Seizure.  Followed by fall. EXAM: CT HEAD WITHOUT CONTRAST CT CERVICAL SPINE WITHOUT CONTRAST TECHNIQUE: Multidetector CT imaging of the head and cervical spine was performed following the standard protocol without intravenous contrast. Multiplanar CT image reconstructions of the cervical spine were also generated. COMPARISON:  CT head 01/28/2020, MR head 11/10/2019 FINDINGS: CT HEAD FINDINGS BRAIN: BRAIN Patchy and  confluent areas of decreased attenuation are noted throughout the deep and periventricular white matter of the cerebral hemispheres bilaterally, compatible with chronic microvascular ischemic disease. No evidence of large-territorial acute infarction. No parenchymal hemorrhage. No mass lesion. No extra-axial collection. No mass effect or midline shift. No hydrocephalus. Basilar cisterns are patent. Partial empty sella. Vascular: No hyperdense vessel. Atherosclerotic calcifications are present within the cavernous internal carotid arteries. Skull: No acute fracture or focal lesion. Sinuses/Orbits: Paranasal sinuses and mastoid air cells are clear. Bilateral lens replacement. Otherwise orbits are unremarkable. Other: None. CT CERVICAL SPINE FINDINGS Alignment: Normal. Skull base and vertebrae: Multilevel degenerative changes of the spine. No acute fracture. No aggressive appearing focal osseous lesion or focal pathologic process. Soft tissues and spinal canal: No prevertebral fluid or swelling. No visible canal hematoma. Upper chest: Biapical pleural/pulmonary scarring. Emphysematous changes. Other: None. IMPRESSION: 1. No acute intracranial abnormality. 2. No acute displaced fracture or traumatic listhesis of the cervical spine. 3.  Emphysema (ICD10-J43.9). 4. Persistent partial empty sella. Findings is often a normal anatomic variant but can be associated with idiopathic intracranial hypertension (pseudotumor cerebri). Electronically Signed   By: Iven Finn M.D.   On: 03/31/2021 22:16   CT Cervical Spine Wo Contrast  Result Date: 03/31/2021 CLINICAL DATA:  Seizure.  Followed by fall. EXAM: CT HEAD WITHOUT CONTRAST CT CERVICAL SPINE WITHOUT CONTRAST TECHNIQUE: Multidetector CT imaging of the head and cervical spine was performed following the standard protocol without intravenous contrast. Multiplanar CT image reconstructions of the cervical spine were also generated. COMPARISON:  CT head 01/28/2020, MR head  11/10/2019 FINDINGS: CT HEAD FINDINGS BRAIN: BRAIN Patchy and confluent areas of decreased attenuation are noted throughout the deep and periventricular white matter of the cerebral hemispheres bilaterally, compatible with chronic microvascular ischemic disease. No evidence of large-territorial acute infarction. No parenchymal hemorrhage. No mass lesion. No extra-axial collection. No mass effect or midline shift. No hydrocephalus. Basilar cisterns are patent. Partial empty sella. Vascular: No hyperdense vessel. Atherosclerotic calcifications are present within the cavernous internal carotid arteries. Skull: No acute fracture or focal lesion. Sinuses/Orbits: Paranasal sinuses and mastoid air cells are clear. Bilateral lens replacement. Otherwise orbits are unremarkable. Other: None. CT CERVICAL SPINE FINDINGS Alignment: Normal. Skull base and vertebrae: Multilevel degenerative changes of the spine. No acute fracture. No aggressive appearing focal osseous lesion or focal pathologic process. Soft tissues and spinal canal: No prevertebral fluid or swelling. No visible canal hematoma. Upper chest: Biapical pleural/pulmonary scarring. Emphysematous changes. Other: None. IMPRESSION: 1. No acute intracranial abnormality. 2. No acute displaced fracture or traumatic listhesis of the cervical spine. 3.  Emphysema (ICD10-J43.9). 4. Persistent partial empty sella. Findings is often a normal anatomic variant but can be associated with idiopathic intracranial hypertension (pseudotumor cerebri). Electronically Signed   By: Iven Finn M.D.   On: 03/31/2021 22:16  DG Chest Port 1 View  Result Date: 04/01/2021 CLINICAL DATA:  75 year old female with history of seizure like activity. EXAM: PORTABLE CHEST 1 VIEW COMPARISON:  Chest x-ray 12/23/2020. FINDINGS: Lung volumes are normal. Patchy ill-defined opacities and areas of interstitial prominence are noted in the lungs bilaterally, most evident in the mid to lower lungs. No  confluent consolidative airspace disease. No pleural effusions. No pneumothorax. No evidence of pulmonary edema. Heart size is mildly enlarged. Upper mediastinal contours are within normal limits. Small left trunk device projecting over the medial left hemithorax, similar to the prior study. IMPRESSION: 1. The appearance of the lungs is concerning for potential multilobar bilateral pneumonia. Clinical correlation for signs and symptoms of viral infection is suggested. 2. Mild cardiomegaly. Electronically Signed   By: Vinnie Langton M.D.   On: 04/01/2021 05:42    Impression: Severe symptomatic blood loss anemia, hematochezia Hemoglobin 5.1 on admission, microcytosis, MCV 76.5 Normal PT/INR  Mild hyponatremia, mild hypokalemia  2 unit PRBC transfusion, hemoglobin now 9.4, hemodynamically stable  Chronic iron deficiency anemia, on ferrous sulfate at home  History of diverticulosis, cecal/IC valve ulceration, biopsies nonspecific Weight loss Multiple comorbidities-History of CHF, COPD, depression, diabetes, glaucoma, hypertension, dyslipidemia, obesity hypoventilation syndrome, pancreatitis, peripheral neuropathy  Plan: Possible diverticular bleed, however patient has history of tubular adenoma in 2014 and nonspecific colitis noted in 2018. As patient has history of constipation, will give MiraLAX 3 times daily today, keep her on clear liquid diet, give colonic prep tomorrow in anticipation of colonoscopy on Monday. The risks and the benefits of the procedure were discussed with the patient in details. She understands and verbalizes consent.   LOS: 0 days   [name], MD  04/01/2021, 9:52 AM

## 2021-04-01 NOTE — ED Notes (Signed)
Son [phone] called for an update please

## 2021-04-01 NOTE — ED Notes (Signed)
Checked patient cbg it was 67 notified RN of blood sugar

## 2021-04-01 NOTE — Progress Notes (Signed)
PROGRESS NOTE    [name]  ZJI:[id number] DOB: 02-Aug-1945 DOA: 03/31/2021 PCP: [name], MD   Chief Complaint  Patient presents with   Loss of Consciousness    Brief Narrative:    [name] is a 75 y.o. female with medical history significant for chronic iron deficiency anemia with baseline hemoglobin 9-12, diverticulosis, chronic diastolic heart failure, COPD, type 2 diabetes mellitus, seizure disorder, who is admitted to [facility] on 03/31/2021 with acute lower gastrointestinal bleed after presenting from home to [facility] ED complaining of loss of consciousness.  Work-up significant for lower GI bleed with hemoglobin of 5.1, she is admitted for further work-up.    Assessment & Plan:   Principal Problem:   Acute lower GI bleeding Active Problems:   Dyslipidemia, goal LDL below 100   DM2 (diabetes mellitus, type 2) (HCC)   Seizure (HCC)   Chronic diastolic CHF (congestive heart failure), NYHA class 2 (HCC)   Syncope   Acute on chronic blood loss anemia   Hypokalemia   Lower GI bleed/acute blood loss anemia -Is most likely GI related to diverticular bleed, however patient has history of tubular adenoma in 2014 and nonspecific colitis noted in 2018. -Hemoglobin significantly low at 5.1 on admission, he received 2 units with good response, repeat is 9.4, will monitor closely and transfuse as needed. -GI input greatly appreciated, plan for bowel prep tomorrow and colonoscopy on Monday.    Syncope:  -Is most likely in the setting of acute blood loss anemia in the setting of lower GI bleed. -Continue to hold Coreg and Norvasc, hydralazine, Imdur.   Hypokalemia:  -repleted   Chronic diastolic heart failure:  - most recent echocardiogram performed in May 2022 demonstrating grade 1 diastolic dysfunction, with additional details further described above.  -No evidence of decompensated heart failure  -We will continue to hold Lasix given her significant  anemia, but I will hold on initiating any IV fluid especially she received blood .     COPD:  -  Outpatient respiratory regimen includes Spiriva as well as as needed albuterol inhaler.  No evidence to suggest acute exacerbation at this time.    Type 2 diabetes mellitus:  - most recent hemoglobin A1c noted to be 7.3%  -  home meds include basal insulin in the form of Toujeo 25 units sq HS as well as short acting insulin sliding scale Humalog 3 times daily with meals.  Continue long-acting insulin at a lower dose and insulin sliding scale.    Essential Hypertension: -Will hold home medications given acute blood loss anemia.    Hyperlipidemia:  -Hold home statin for now   Seizure disorder:  Resume home emds   DVT prophylaxis: SCD Code Status: Full Family Communication: None at bedside Disposition:   Status is: Observation  The patient will require care spanning > 2 midnights and should be moved to inpatient because: Ongoing diagnostic testing needed not appropriate for outpatient work up  Dispo: The patient is from: Home              Anticipated d/c is to: Home              Patient currently is not medically stable to d/c.   Difficult to place patient No       Consultants:  GI   Subjective:   No bowel movement overnight, no nausea, no vomiting.  Objective: Vitals:   04/01/21 1045 04/01/21 1100 04/01/21 1200 04/01/21 1300  BP:  Marland Kitchen)  141/73 (!) 130/55 (!) 135/58  Pulse: 64 65 65 63  Resp: 17 10 14 20   Temp:      TempSrc:      SpO2: 95% 92% 95% 92%  Weight:      Height:        Intake/Output Summary (Last 24 hours) at 04/01/2021 1405 Last data filed at 04/01/2021 0945 Gross per 24 hour  Intake 880.11 ml  Output 900 ml  Net -19.89 ml   Filed Weights   03/31/21 2049  Weight: 87.5 kg    Examination:  General exam: Appears calm and comfortable  Respiratory system: Clear to auscultation. Respiratory effort normal. Cardiovascular system: S1 & S2 heard, RRR.  No JVD, murmurs, rubs, gallops or clicks. No pedal edema. Gastrointestinal system: Abdomen is nondistended, soft and nontender. No organomegaly or masses felt. Normal bowel sounds heard. Central nervous system: Alert and oriented. No focal neurological deficits. Extremities: Symmetric 5 x 5 power. Skin: No rashes, lesions or ulcers Psychiatry: Judgement and insight appear normal. Mood & affect appropriate.     Data Reviewed: I have personally reviewed following labs and imaging studies  CBC: Recent Labs  Lab 03/31/21 2044 04/01/21 0855  WBC 5.3 4.2  NEUTROABS 2.8  --   HGB 5.1* 9.4*  HCT 16.6* 29.0*  MCV 76.5* 76.9*  PLT 262 098    Basic Metabolic Panel: Recent Labs  Lab 03/27/21 1536 03/31/21 2044 04/01/21 0855  NA 137 133* 138  K 3.9 3.3* 3.8  CL 96 97* 103  CO2 26 25 27   GLUCOSE 85 185* 84  BUN 16 14 13   CREATININE 0.82 0.88 0.67  CALCIUM 9.6 8.6* 8.8*  MG  --   --  2.0    GFR: Estimated Creatinine Clearance: 63.9 mL/min (by C-G formula based on SCr of 0.67 mg/dL).  Liver Function Tests: Recent Labs  Lab 03/31/21 2044 04/01/21 0855  AST 20 17  ALT 14 14  ALKPHOS 102 88  BILITOT 0.2* 1.5*  PROT 6.6 6.6  ALBUMIN 3.2* 3.1*    CBG: Recent Labs  Lab 04/01/21 0036 04/01/21 0604 04/01/21 1125  GLUCAP 136* 89 81     Recent Results (from the past 240 hour(s))  Resp Panel by RT-PCR (Flu A&B, Covid) Nasopharyngeal Swab     Status: None   Collection Time: 03/31/21 11:13 PM   Specimen: Nasopharyngeal Swab; Nasopharyngeal(NP) swabs in vial transport medium  Result Value Ref Range Status   SARS Coronavirus 2 by RT PCR NEGATIVE NEGATIVE Final    Comment: (NOTE) SARS-CoV-2 target nucleic acids are NOT DETECTED.  The SARS-CoV-2 RNA is generally detectable in upper respiratory specimens during the acute phase of infection. The lowest concentration of SARS-CoV-2 viral copies this assay can detect is 138 copies/mL. A negative result does not preclude  SARS-Cov-2 infection and should not be used as the sole basis for treatment or other patient management decisions. A negative result may occur with  improper specimen collection/handling, submission of specimen other than nasopharyngeal swab, presence of viral mutation(s) within the areas targeted by this assay, and inadequate number of viral copies(<138 copies/mL). A negative result must be combined with clinical observations, patient history, and epidemiological information. The expected result is Negative.  Fact Sheet for Patients:  EntrepreneurPulse.com.au  Fact Sheet for Healthcare Providers:  IncredibleEmployment.be  This test is no t yet approved or cleared by the [place] FDA and  has been authorized for detection and/or diagnosis of SARS-CoV-2 by FDA under an Emergency Use Authorization (  EUA). This EUA will remain  in effect (meaning this test can be used) for the duration of the COVID-19 declaration under Section 564(b)(1) of the Act, 21 U.S.C.section 360bbb-3(b)(1), unless the authorization is terminated  or revoked sooner.       Influenza A by PCR NEGATIVE NEGATIVE Final   Influenza B by PCR NEGATIVE NEGATIVE Final    Comment: (NOTE) The Xpert Xpress SARS-CoV-2/FLU/RSV plus assay is intended as an aid in the diagnosis of influenza from Nasopharyngeal swab specimens and should not be used as a sole basis for treatment. Nasal washings and aspirates are unacceptable for Xpert Xpress SARS-CoV-2/FLU/RSV testing.  Fact Sheet for Patients: EntrepreneurPulse.com.au  Fact Sheet for Healthcare Providers: IncredibleEmployment.be  This test is not yet approved or cleared by the [place] FDA and has been authorized for detection and/or diagnosis of SARS-CoV-2 by FDA under an Emergency Use Authorization (EUA). This EUA will remain in effect (meaning this test can be used) for the duration of  the COVID-19 declaration under Section 564(b)(1) of the Act, 21 U.S.C. section 360bbb-3(b)(1), unless the authorization is terminated or revoked.  Performed at [facility], [place] [address], [place], [place] [address]          Radiology Studies: CT HEAD WO CONTRAST (5MM)  Result Date: 03/31/2021 CLINICAL DATA:  Seizure.  Followed by fall. EXAM: CT HEAD WITHOUT CONTRAST CT CERVICAL SPINE WITHOUT CONTRAST TECHNIQUE: Multidetector CT imaging of the head and cervical spine was performed following the standard protocol without intravenous contrast. Multiplanar CT image reconstructions of the cervical spine were also generated. COMPARISON:  CT head 01/28/2020, MR head 11/10/2019 FINDINGS: CT HEAD FINDINGS BRAIN: BRAIN Patchy and confluent areas of decreased attenuation are noted throughout the deep and periventricular white matter of the cerebral hemispheres bilaterally, compatible with chronic microvascular ischemic disease. No evidence of large-territorial acute infarction. No parenchymal hemorrhage. No mass lesion. No extra-axial collection. No mass effect or midline shift. No hydrocephalus. Basilar cisterns are patent. Partial empty sella. Vascular: No hyperdense vessel. Atherosclerotic calcifications are present within the cavernous internal carotid arteries. Skull: No acute fracture or focal lesion. Sinuses/Orbits: Paranasal sinuses and mastoid air cells are clear. Bilateral lens replacement. Otherwise orbits are unremarkable. Other: None. CT CERVICAL SPINE FINDINGS Alignment: Normal. Skull base and vertebrae: Multilevel degenerative changes of the spine. No acute fracture. No aggressive appearing focal osseous lesion or focal pathologic process. Soft tissues and spinal canal: No prevertebral fluid or swelling. No visible canal hematoma. Upper chest: Biapical pleural/pulmonary scarring. Emphysematous changes. Other: None. IMPRESSION: 1. No acute intracranial abnormality. 2. No acute displaced  fracture or traumatic listhesis of the cervical spine. 3.  Emphysema (ICD10-J43.9). 4. Persistent partial empty sella. Findings is often a normal anatomic variant but can be associated with idiopathic intracranial hypertension (pseudotumor cerebri). Electronically Signed   By: Iven Finn M.D.   On: 03/31/2021 22:16   CT Cervical Spine Wo Contrast  Result Date: 03/31/2021 CLINICAL DATA:  Seizure.  Followed by fall. EXAM: CT HEAD WITHOUT CONTRAST CT CERVICAL SPINE WITHOUT CONTRAST TECHNIQUE: Multidetector CT imaging of the head and cervical spine was performed following the standard protocol without intravenous contrast. Multiplanar CT image reconstructions of the cervical spine were also generated. COMPARISON:  CT head 01/28/2020, MR head 11/10/2019 FINDINGS: CT HEAD FINDINGS BRAIN: BRAIN Patchy and confluent areas of decreased attenuation are noted throughout the deep and periventricular white matter of the cerebral hemispheres bilaterally, compatible with chronic microvascular ischemic disease. No evidence of large-territorial acute infarction. No  parenchymal hemorrhage. No mass lesion. No extra-axial collection. No mass effect or midline shift. No hydrocephalus. Basilar cisterns are patent. Partial empty sella. Vascular: No hyperdense vessel. Atherosclerotic calcifications are present within the cavernous internal carotid arteries. Skull: No acute fracture or focal lesion. Sinuses/Orbits: Paranasal sinuses and mastoid air cells are clear. Bilateral lens replacement. Otherwise orbits are unremarkable. Other: None. CT CERVICAL SPINE FINDINGS Alignment: Normal. Skull base and vertebrae: Multilevel degenerative changes of the spine. No acute fracture. No aggressive appearing focal osseous lesion or focal pathologic process. Soft tissues and spinal canal: No prevertebral fluid or swelling. No visible canal hematoma. Upper chest: Biapical pleural/pulmonary scarring. Emphysematous changes. Other: None.  IMPRESSION: 1. No acute intracranial abnormality. 2. No acute displaced fracture or traumatic listhesis of the cervical spine. 3.  Emphysema (ICD10-J43.9). 4. Persistent partial empty sella. Findings is often a normal anatomic variant but can be associated with idiopathic intracranial hypertension (pseudotumor cerebri). Electronically Signed   By: Iven Finn M.D.   On: 03/31/2021 22:16   DG Chest Port 1 View  Result Date: 04/01/2021 CLINICAL DATA:  75 year old female with history of seizure like activity. EXAM: PORTABLE CHEST 1 VIEW COMPARISON:  Chest x-ray 12/23/2020. FINDINGS: Lung volumes are normal. Patchy ill-defined opacities and areas of interstitial prominence are noted in the lungs bilaterally, most evident in the mid to lower lungs. No confluent consolidative airspace disease. No pleural effusions. No pneumothorax. No evidence of pulmonary edema. Heart size is mildly enlarged. Upper mediastinal contours are within normal limits. Small left trunk device projecting over the medial left hemithorax, similar to the prior study. IMPRESSION: 1. The appearance of the lungs is concerning for potential multilobar bilateral pneumonia. Clinical correlation for signs and symptoms of viral infection is suggested. 2. Mild cardiomegaly. Electronically Signed   By: Vinnie Langton M.D.   On: 04/01/2021 05:42        Scheduled Meds:  insulin aspart  0-6 Units Subcutaneous Q6H   insulin glargine-yfgn  6 Units Subcutaneous QHS   pantoprazole (PROTONIX) IV  40 mg Intravenous Q24H   polyethylene glycol  17 g Oral TID   [START ON 04/02/2021] polyethylene glycol-electrolytes  4,000 mL Oral Once   umeclidinium bromide  1 puff Inhalation Daily   Continuous Infusions:  levETIRAcetam Stopped (04/01/21 0921)     LOS: 0 days       Phillips Climes, MD [facility]   To contact the attending provider between 7A-7P or the covering provider during after hours 7P-7A, please log into the web site  [email or web address] and access using universal [place] password for that web site. If you do not have the password, please call the hospital operator.  04/01/2021, 2:05 PM

## 2021-04-01 NOTE — ED Notes (Signed)
Pt receiving blood will have to wait for morning labs

## 2021-04-02 DIAGNOSIS — K922 Gastrointestinal hemorrhage, unspecified: Secondary | ICD-10-CM | POA: Diagnosis not present

## 2021-04-02 DIAGNOSIS — Z8719 Personal history of other diseases of the digestive system: Secondary | ICD-10-CM | POA: Diagnosis not present

## 2021-04-02 DIAGNOSIS — D62 Acute posthemorrhagic anemia: Secondary | ICD-10-CM | POA: Diagnosis not present

## 2021-04-02 DIAGNOSIS — I5032 Chronic diastolic (congestive) heart failure: Secondary | ICD-10-CM | POA: Diagnosis not present

## 2021-04-02 DIAGNOSIS — R4020 Unspecified coma: Secondary | ICD-10-CM | POA: Diagnosis not present

## 2021-04-02 LAB — TYPE AND SCREEN
ABO/RH(D): B POS
Antibody Screen: NEGATIVE
Unit division: 0
Unit division: 0

## 2021-04-02 LAB — GLUCOSE, CAPILLARY
Glucose-Capillary: 104 mg/dL — ABNORMAL HIGH (ref 70–99)
Glucose-Capillary: 148 mg/dL — ABNORMAL HIGH (ref 70–99)
Glucose-Capillary: 100 mg/dL — ABNORMAL HIGH (ref 70–99)
Glucose-Capillary: 125 mg/dL — ABNORMAL HIGH (ref 70–99)

## 2021-04-02 LAB — BASIC METABOLIC PANEL
Anion gap: 6 (ref 5–15)
BUN: 6 mg/dL — ABNORMAL LOW (ref 8–23)
CO2: 28 mmol/L (ref 22–32)
Calcium: 8.8 mg/dL — ABNORMAL LOW (ref 8.9–10.3)
Chloride: 102 mmol/L (ref 98–111)
Creatinine, Ser: 0.7 mg/dL (ref 0.44–1.00)
GFR, Estimated: >60 mL/min (ref >60)
Glucose, Bld: 107 mg/dL — ABNORMAL HIGH (ref 70–99)
Potassium: 3.2 mmol/L — ABNORMAL LOW (ref 3.5–5.1)
Sodium: 136 mmol/L (ref 135–145)

## 2021-04-02 LAB — CBC
HCT: 29.8 % — ABNORMAL LOW (ref 36.0–46.0)
Hemoglobin: 9.6 g/dL — ABNORMAL LOW (ref 12.0–15.0)
MCH: 24.4 pg — ABNORMAL LOW (ref 26.0–34.0)
MCHC: 32.2 g/dL (ref 30.0–36.0)
MCV: 75.6 fL — ABNORMAL LOW (ref 80.0–100.0)
Platelets: 244 10*3/uL (ref 150–400)
RBC: 3.94 MIL/uL (ref 3.87–5.11)
RDW: 16.7 % — ABNORMAL HIGH (ref 11.5–15.5)
WBC: 5.2 10*3/uL (ref 4.0–10.5)
nRBC: 0 % (ref 0.0–0.2)

## 2021-04-02 LAB — BPAM RBC
Blood Product Expiration Date: 202210182359
Blood Product Expiration Date: 202210182359
ISSUE DATE / TIME: 202210080138
ISSUE DATE / TIME: 202210080458
Unit Type and Rh: 7300
Unit Type and Rh: 7300

## 2021-04-02 MED ORDER — CARVEDILOL 6.25 MG PO TABS
6.2500 mg | ORAL_TABLET | Freq: Two times a day (BID) | ORAL | Status: DC
  Administered 2021-04-02 – 2021-04-03 (×2): 6.25 mg via ORAL
  Filled 2021-04-02 (×2): qty 1

## 2021-04-02 MED ORDER — POTASSIUM CHLORIDE CRYS ER 20 MEQ PO TBCR
40.0000 meq | EXTENDED_RELEASE_TABLET | Freq: Four times a day (QID) | ORAL | Status: AC
  Administered 2021-04-02 (×2): 40 meq via ORAL
  Filled 2021-04-02 (×2): qty 2

## 2021-04-02 MED ORDER — ATORVASTATIN CALCIUM 10 MG PO TABS
10.0000 mg | ORAL_TABLET | Freq: Every evening | ORAL | Status: DC
  Administered 2021-04-02: 10 mg via ORAL
  Filled 2021-04-02: qty 1

## 2021-04-02 NOTE — Progress Notes (Signed)
Pt sitting up on side of bed talking on phone, denies pain or discomfort, reports brown stools, encouraged to continue with prep, educated on new medication, no concerns noted.

## 2021-04-02 NOTE — Progress Notes (Signed)
Pt sitting on edge of bed talking on cell phone, denies pain or discomfort, bowel prep drink at bedside, encouraged pt to continue drinking prep and to report any bm results, pt verbalized understanding, resp even and unlabored, portable tele in use, pt on clear liquid diet, no concerns noted at this time.

## 2021-04-02 NOTE — Care Management Obs Status (Signed)
[facility] NOTIFICATION   Patient Details  Name: [name] MRN: [id number] Date of Birth: 04-14-1946   Medicare Observation Status Notification Given:  Yes  Notice given by phone due to remote, Patient gave permission to sign acknowledging notice  Verdell Carmine, RN 04/02/2021, 4:45 PM

## 2021-04-02 NOTE — Progress Notes (Signed)
Subjective: Patient reports having bowel movement yesterday, minimal amount of black/blood noted. Denies abdominal pain. Tolerating clear liquids without nausea or vomiting.  Objective: Vital signs in last 24 hours: Temp:  [97.9 F (36.6 C)-98.2 F (36.8 C)] 98.1 F (36.7 C) (10/09 0754) Pulse Rate:  [63-74] 65 (10/09 0754) Resp:  [10-20] 18 (10/09 0754) BP: (103-161)/(55-87) 149/73 (10/09 0754) SpO2:  [92 %-99 %] 99 % (10/09 0358) Weight:  [87.9 kg] 87.9 kg (10/09 0358) Weight change: 0.356 kg Last BM Date: 04/01/21  PE: Sitting up on bed, cheerful, pleasant, not in distress GENERAL: Mild pallor  ABDOMEN: Soft, mild periumbilical tenderness, normoactive bowel sounds EXTREMITIES: No deformity  Lab Results: Results for orders placed or performed during the hospital encounter of 03/31/21 (from the past 48 hour(s))  CBC with Differential     Status: Abnormal   Collection Time: 03/31/21  8:44 PM  Result Value Ref Range   WBC 5.3 4.0 - 10.5 K/uL   RBC 2.17 (L) 3.87 - 5.11 MIL/uL   Hemoglobin 5.1 (LL) 12.0 - 15.0 g/dL    Comment: REPEATED TO VERIFY Reticulocyte Hemoglobin testing may be clinically indicated, consider ordering this additional test [id number] THIS CRITICAL RESULT HAS VERIFIED AND BEEN CALLED TO [facility].[place],RN BY Durene Cal ON 10 07 2022 AT 2127, AND HAS BEEN READ BACK.     HCT 16.6 (L) 36.0 - 46.0 %   MCV 76.5 (L) 80.0 - 100.0 fL   MCH 24.0 (L) 26.0 - 34.0 pg   MCHC 31.3 30.0 - 36.0 g/dL   RDW 16.0 (H) 11.5 - 15.5 %   Platelets 262 150 - 400 K/uL   nRBC 0.0 0.0 - 0.2 %   Neutrophils Relative % 53 %   Neutro Abs 2.8 1.7 - 7.7 K/uL   Lymphocytes Relative 33 %   Lymphs Abs 1.8 0.7 - 4.0 K/uL   Monocytes Relative 10 %   Monocytes Absolute 0.5 0.1 - 1.0 K/uL   Eosinophils Relative 4 %   Eosinophils Absolute 0.2 0.0 - 0.5 K/uL   Basophils Relative 0 %   Basophils Absolute 0.0 0.0 - 0.1 K/uL   Immature Granulocytes 0 %   Abs Immature Granulocytes 0.01 0.00 - 0.07  K/uL    Comment: Performed at [facility] [address] [place], [place] [address]  Comprehensive metabolic panel     Status: Abnormal   Collection Time: 03/31/21  8:44 PM  Result Value Ref Range   Sodium 133 (L) 135 - 145 mmol/L   Potassium 3.3 (L) 3.5 - 5.1 mmol/L   Chloride 97 (L) 98 - 111 mmol/L   CO2 25 22 - 32 mmol/L   Glucose, Bld 185 (H) 70 - 99 mg/dL    Comment: Glucose reference range applies only to samples taken after fasting for at least 8 hours.   BUN 14 8 - 23 mg/dL   Creatinine, Ser 0.88 0.44 - 1.00 mg/dL   Calcium 8.6 (L) 8.9 - 10.3 mg/dL   Total Protein 6.6 6.5 - 8.1 g/dL   Albumin 3.2 (L) 3.5 - 5.0 g/dL   AST 20 15 - 41 U/L   ALT 14 0 - 44 U/L   Alkaline Phosphatase 102 38 - 126 U/L   Total Bilirubin 0.2 (L) 0.3 - 1.2 mg/dL   GFR, Estimated >60 >60 mL/min    Comment: (NOTE) Calculated using the CKD-EPI Creatinine Equation (2021)    Anion gap 11 5 - 15    Comment: Performed at [facility], [place] [address] [place], [place] [address]  POC occult blood, ED     Status: Abnormal   Collection Time: 03/31/21  9:09 PM  Result Value Ref Range   Fecal Occult Bld POSITIVE (A) NEGATIVE  Type and screen [place]     Status: None   Collection Time: 03/31/21  9:15 PM  Result Value Ref Range   ABO/RH(D) B POS    Antibody Screen NEG    Sample Expiration 04/03/2021,2359    Unit Number [id number]    Blood Component Type RED CELLS,LR    Unit division 00    Status of Unit ISSUED,FINAL    Transfusion Status OK TO TRANSFUSE    Crossmatch Result      Compatible Performed at [facility], [place] [address] [place], [place] [address]    Unit Number [id number]    Blood Component Type RED CELLS,LR    Unit division 00    Status of Unit ISSUED,FINAL    Transfusion Status OK TO TRANSFUSE    Crossmatch Result Compatible   Prepare RBC (crossmatch)     Status: None   Collection Time: 03/31/21  9:58 PM  Result Value Ref Range   Order  Confirmation      ORDER PROCESSED BY BLOOD BANK Performed at [facility], [place] [address] [place],  [address]   Urinalysis, Routine w reflex microscopic     Status: Abnormal   Collection Time: 03/31/21 11:00 PM  Result Value Ref Range   Color, Urine STRAW (A) YELLOW   APPearance CLEAR CLEAR   Specific Gravity, Urine 1.004 (L) 1.005 - 1.030   pH 5.0 5.0 - 8.0   Glucose, UA NEGATIVE NEGATIVE mg/dL   Hgb urine dipstick NEGATIVE NEGATIVE   Bilirubin Urine NEGATIVE NEGATIVE   Ketones, ur NEGATIVE NEGATIVE mg/dL   Protein, ur NEGATIVE NEGATIVE mg/dL   Nitrite NEGATIVE NEGATIVE   Leukocytes,Ua NEGATIVE NEGATIVE    Comment: Performed at [facility] [address], [place],  [address]  Resp Panel by RT-PCR (Flu A&B, Covid) Nasopharyngeal Swab     Status: None   Collection Time: 03/31/21 11:13 PM   Specimen: Nasopharyngeal Swab; Nasopharyngeal(NP) swabs in vial transport medium  Result Value Ref Range   SARS Coronavirus 2 by RT PCR NEGATIVE NEGATIVE    Comment: (NOTE) SARS-CoV-2 target nucleic acids are NOT DETECTED.  The SARS-CoV-2 RNA is generally detectable in upper respiratory specimens during the acute phase of infection. The lowest concentration of SARS-CoV-2 viral copies this assay can detect is 138 copies/mL. A negative result does not preclude SARS-Cov-2 infection and should not be used as the sole basis for treatment or other patient management decisions. A negative result may occur with  improper specimen collection/handling, submission of specimen other than nasopharyngeal swab, presence of viral mutation(s) within the areas targeted by this assay, and inadequate number of viral copies(<138 copies/mL). A negative result must be combined with clinical observations, patient history, and epidemiological information. The expected result is Negative.  Fact Sheet for Patients:  EntrepreneurPulse.com.au  Fact Sheet for Healthcare  Providers:  IncredibleEmployment.be  This test is no t yet approved or cleared by the [place] FDA and  has been authorized for detection and/or diagnosis of SARS-CoV-2 by FDA under an Emergency Use Authorization (EUA). This EUA will remain  in effect (meaning this test can be used) for the duration of the COVID-19 declaration under Section 564(b)(1) of the Act, 21  U.S.C.section 360bbb-3(b)(1), unless the authorization is terminated  or revoked sooner.       Influenza A by PCR NEGATIVE NEGATIVE   Influenza B by PCR NEGATIVE NEGATIVE    Comment: (NOTE) The Xpert Xpress SARS-CoV-2/FLU/RSV plus assay is intended as an aid in the diagnosis of influenza from Nasopharyngeal swab specimens and should not be used as a sole basis for treatment. Nasal washings and aspirates are unacceptable for Xpert Xpress SARS-CoV-2/FLU/RSV testing.  Fact Sheet for Patients: EntrepreneurPulse.com.au  Fact Sheet for Healthcare Providers: IncredibleEmployment.be  This test is not yet approved or cleared by the [place] FDA and has been authorized for detection and/or diagnosis of SARS-CoV-2 by FDA under an Emergency Use Authorization (EUA). This EUA will remain in effect (meaning this test can be used) for the duration of the COVID-19 declaration under Section 564(b)(1) of the Act, 21 U.S.C. section 360bbb-3(b)(1), unless the authorization is terminated or revoked.  Performed at [facility], [place] [address], [place], [place] [address]   POC CBG, ED     Status: Abnormal   Collection Time: 04/01/21 12:36 AM  Result Value Ref Range   Glucose-Capillary 136 (H) 70 - 99 mg/dL    Comment: Glucose reference range applies only to samples taken after fasting for at least 8 hours.  CBG monitoring, ED     Status: None   Collection Time: 04/01/21  6:04 AM  Result Value Ref Range   Glucose-Capillary 89 70 - 99 mg/dL    Comment: Glucose  reference range applies only to samples taken after fasting for at least 8 hours.  Magnesium     Status: None   Collection Time: 04/01/21  8:55 AM  Result Value Ref Range   Magnesium 2.0 1.7 - 2.4 mg/dL    Comment: Performed at [facility] [address], [place], [place] [address]  Comprehensive metabolic panel     Status: Abnormal   Collection Time: 04/01/21  8:55 AM  Result Value Ref Range   Sodium 138 135 - 145 mmol/L   Potassium 3.8 3.5 - 5.1 mmol/L   Chloride 103 98 - 111 mmol/L   CO2 27 22 - 32 mmol/L   Glucose, Bld 84 70 - 99 mg/dL    Comment: Glucose reference range applies only to samples taken after fasting for at least 8 hours.   BUN 13 8 - 23 mg/dL   Creatinine, Ser 0.67 0.44 - 1.00 mg/dL   Calcium 8.8 (L) 8.9 - 10.3 mg/dL   Total Protein 6.6 6.5 - 8.1 g/dL   Albumin 3.1 (L) 3.5 - 5.0 g/dL   AST 17 15 - 41 U/L   ALT 14 0 - 44 U/L   Alkaline Phosphatase 88 38 - 126 U/L   Total Bilirubin 1.5 (H) 0.3 - 1.2 mg/dL   GFR, Estimated >60 >60 mL/min    Comment: (NOTE) Calculated using the CKD-EPI Creatinine Equation (2021)    Anion gap 8 5 - 15    Comment: Performed at [facility], [place] [address] [place], [place] [address]  CBC     Status: Abnormal   Collection Time: 04/01/21  8:55 AM  Result Value Ref Range   WBC 4.2 4.0 - 10.5 K/uL   RBC 3.77 (L) 3.87 - 5.11 MIL/uL   Hemoglobin 9.4 (L) 12.0 - 15.0 g/dL    Comment: REPEATED TO VERIFY POST TRANSFUSION SPECIMEN    HCT 29.0 (L) 36.0 - 46.0 %   MCV 76.9 (L)  80.0 - 100.0 fL   MCH 24.9 (L) 26.0 - 34.0 pg   MCHC 32.4 30.0 - 36.0 g/dL   RDW 16.6 (H) 11.5 - 15.5 %   Platelets 252 150 - 400 K/uL   nRBC 0.0 0.0 - 0.2 %    Comment: Performed at [facility] [address] [place], [place] [address]  Protime-INR     Status: None   Collection Time: 04/01/21  8:55 AM  Result Value Ref Range   Prothrombin Time 14.8 11.4 - 15.2 seconds   INR 1.2 0.8 - 1.2    Comment: (NOTE) INR goal varies based on  device and disease states. Performed at [facility], [place] [address], [place], [place] [address]   Troponin I (High Sensitivity)     Status: None   Collection Time: 04/01/21  8:55 AM  Result Value Ref Range   Troponin I (High Sensitivity) 10 <18 ng/L    Comment: (NOTE) Elevated high sensitivity troponin I (hsTnI) values and significant  changes across serial measurements may suggest ACS but many other  chronic and acute conditions are known to elevate hsTnI results.  Refer to the "Links" section for chest pain algorithms and additional  guidance. Performed at [facility], [place] [address] [place], [place] [address]   Troponin I (High Sensitivity)     Status: None   Collection Time: 04/01/21  9:00 AM  Result Value Ref Range   Troponin I (High Sensitivity) 9 <18 ng/L    Comment: (NOTE) Elevated high sensitivity troponin I (hsTnI) values and significant  changes across serial measurements may suggest ACS but many other  chronic and acute conditions are known to elevate hsTnI results.  Refer to the "Links" section for chest pain algorithms and additional  guidance. Performed at [facility], [place] [address] [place], [place] [address]   CBG monitoring, ED     Status: None   Collection Time: 04/01/21 11:25 AM  Result Value Ref Range   Glucose-Capillary 81 70 - 99 mg/dL    Comment: Glucose reference range applies only to samples taken after fasting for at least 8 hours.  CBC     Status: Abnormal   Collection Time: 04/01/21  4:00 PM  Result Value Ref Range   WBC 4.9 4.0 - 10.5 K/uL   RBC 3.82 (L) 3.87 - 5.11 MIL/uL   Hemoglobin 9.3 (L) 12.0 - 15.0 g/dL   HCT 29.5 (L) 36.0 - 46.0 %   MCV 77.2 (L) 80.0 - 100.0 fL   MCH 24.3 (L) 26.0 - 34.0 pg   MCHC 31.5 30.0 - 36.0 g/dL   RDW 16.6 (H) 11.5 - 15.5 %   Platelets 258 150 - 400 K/uL   nRBC 0.0 0.0 - 0.2 %    Comment: Performed at [facility] [address] [place], [place] [address]  Glucose, capillary      Status: None   Collection Time: 04/01/21  5:44 PM  Result Value Ref Range   Glucose-Capillary 89 70 - 99 mg/dL    Comment: Glucose reference range applies only to samples taken after fasting for at least 8 hours.  Glucose, capillary     Status: Abnormal   Collection Time: 04/01/21  8:52 PM  Result Value Ref Range   Glucose-Capillary 178 (H) 70 - 99 mg/dL    Comment: Glucose reference range applies only to samples taken after fasting for at least 8 hours.  CBC  Status: Abnormal   Collection Time: 04/02/21  1:39 AM  Result Value Ref Range   WBC 5.2 4.0 - 10.5 K/uL   RBC 3.94 3.87 - 5.11 MIL/uL   Hemoglobin 9.6 (L) 12.0 - 15.0 g/dL   HCT 29.8 (L) 36.0 - 46.0 %   MCV 75.6 (L) 80.0 - 100.0 fL   MCH 24.4 (L) 26.0 - 34.0 pg   MCHC 32.2 30.0 - 36.0 g/dL   RDW 16.7 (H) 11.5 - 15.5 %   Platelets 244 150 - 400 K/uL   nRBC 0.0 0.0 - 0.2 %    Comment: Performed at [facility] [address] [place], [place] [address]  Basic metabolic panel     Status: Abnormal   Collection Time: 04/02/21  1:39 AM  Result Value Ref Range   Sodium 136 135 - 145 mmol/L   Potassium 3.2 (L) 3.5 - 5.1 mmol/L   Chloride 102 98 - 111 mmol/L   CO2 28 22 - 32 mmol/L   Glucose, Bld 107 (H) 70 - 99 mg/dL    Comment: Glucose reference range applies only to samples taken after fasting for at least 8 hours.   BUN 6 (L) 8 - 23 mg/dL   Creatinine, Ser 0.70 0.44 - 1.00 mg/dL   Calcium 8.8 (L) 8.9 - 10.3 mg/dL   GFR, Estimated >60 >60 mL/min    Comment: (NOTE) Calculated using the CKD-EPI Creatinine Equation (2021)    Anion gap 6 5 - 15    Comment: Performed at [facility] [address] [place], [place] [address]  Glucose, capillary     Status: Abnormal   Collection Time: 04/02/21  8:08 AM  Result Value Ref Range   Glucose-Capillary 104 (H) 70 - 99 mg/dL    Comment: Glucose reference range applies only to samples taken after fasting for at least 8 hours.   *Note: Due to a large number of  results and/or encounters for the requested time period, some results have not been displayed. A complete set of results can be found in Results Review.    Studies/Results: CT HEAD WO CONTRAST (5MM)  Result Date: 03/31/2021 CLINICAL DATA:  Seizure.  Followed by fall. EXAM: CT HEAD WITHOUT CONTRAST CT CERVICAL SPINE WITHOUT CONTRAST TECHNIQUE: Multidetector CT imaging of the head and cervical spine was performed following the standard protocol without intravenous contrast. Multiplanar CT image reconstructions of the cervical spine were also generated. COMPARISON:  CT head 01/28/2020, MR head 11/10/2019 FINDINGS: CT HEAD FINDINGS BRAIN: BRAIN Patchy and confluent areas of decreased attenuation are noted throughout the deep and periventricular white matter of the cerebral hemispheres bilaterally, compatible with chronic microvascular ischemic disease. No evidence of large-territorial acute infarction. No parenchymal hemorrhage. No mass lesion. No extra-axial collection. No mass effect or midline shift. No hydrocephalus. Basilar cisterns are patent. Partial empty sella. Vascular: No hyperdense vessel. Atherosclerotic calcifications are present within the cavernous internal carotid arteries. Skull: No acute fracture or focal lesion. Sinuses/Orbits: Paranasal sinuses and mastoid air cells are clear. Bilateral lens replacement. Otherwise orbits are unremarkable. Other: None. CT CERVICAL SPINE FINDINGS Alignment: Normal. Skull base and vertebrae: Multilevel degenerative changes of the spine. No acute fracture. No aggressive appearing focal osseous lesion or focal pathologic process. Soft tissues and spinal canal: No prevertebral fluid or swelling. No visible canal hematoma. Upper chest: Biapical pleural/pulmonary scarring. Emphysematous changes. Other: None. IMPRESSION: 1. No acute intracranial abnormality. 2. No acute displaced fracture or traumatic listhesis of the cervical spine.  3.  Emphysema (ICD10-J43.9). 4.  Persistent partial empty sella. Findings is often a normal anatomic variant but can be associated with idiopathic intracranial hypertension (pseudotumor cerebri). Electronically Signed   By: Iven Finn M.D.   On: 03/31/2021 22:16   CT Cervical Spine Wo Contrast  Result Date: 03/31/2021 CLINICAL DATA:  Seizure.  Followed by fall. EXAM: CT HEAD WITHOUT CONTRAST CT CERVICAL SPINE WITHOUT CONTRAST TECHNIQUE: Multidetector CT imaging of the head and cervical spine was performed following the standard protocol without intravenous contrast. Multiplanar CT image reconstructions of the cervical spine were also generated. COMPARISON:  CT head 01/28/2020, MR head 11/10/2019 FINDINGS: CT HEAD FINDINGS BRAIN: BRAIN Patchy and confluent areas of decreased attenuation are noted throughout the deep and periventricular white matter of the cerebral hemispheres bilaterally, compatible with chronic microvascular ischemic disease. No evidence of large-territorial acute infarction. No parenchymal hemorrhage. No mass lesion. No extra-axial collection. No mass effect or midline shift. No hydrocephalus. Basilar cisterns are patent. Partial empty sella. Vascular: No hyperdense vessel. Atherosclerotic calcifications are present within the cavernous internal carotid arteries. Skull: No acute fracture or focal lesion. Sinuses/Orbits: Paranasal sinuses and mastoid air cells are clear. Bilateral lens replacement. Otherwise orbits are unremarkable. Other: None. CT CERVICAL SPINE FINDINGS Alignment: Normal. Skull base and vertebrae: Multilevel degenerative changes of the spine. No acute fracture. No aggressive appearing focal osseous lesion or focal pathologic process. Soft tissues and spinal canal: No prevertebral fluid or swelling. No visible canal hematoma. Upper chest: Biapical pleural/pulmonary scarring. Emphysematous changes. Other: None. IMPRESSION: 1. No acute intracranial abnormality. 2. No acute displaced fracture or traumatic  listhesis of the cervical spine. 3.  Emphysema (ICD10-J43.9). 4. Persistent partial empty sella. Findings is often a normal anatomic variant but can be associated with idiopathic intracranial hypertension (pseudotumor cerebri). Electronically Signed   By: Iven Finn M.D.   On: 03/31/2021 22:16   DG Chest Port 1 View  Result Date: 04/01/2021 CLINICAL DATA:  75 year old female with history of seizure like activity. EXAM: PORTABLE CHEST 1 VIEW COMPARISON:  Chest x-ray 12/23/2020. FINDINGS: Lung volumes are normal. Patchy ill-defined opacities and areas of interstitial prominence are noted in the lungs bilaterally, most evident in the mid to lower lungs. No confluent consolidative airspace disease. No pleural effusions. No pneumothorax. No evidence of pulmonary edema. Heart size is mildly enlarged. Upper mediastinal contours are within normal limits. Small left trunk device projecting over the medial left hemithorax, similar to the prior study. IMPRESSION: 1. The appearance of the lungs is concerning for potential multilobar bilateral pneumonia. Clinical correlation for signs and symptoms of viral infection is suggested. 2. Mild cardiomegaly. Electronically Signed   By: Vinnie Langton M.D.   On: 04/01/2021 05:42    Medications: I have reviewed the patient's current medications.  Assessment: Hematochezia, leading to loss of consciousness on admission, status post 2 units of PRBC transfusion Hemoglobin has improved from 5.1 and remained steady at 9.4/9.3/9.6 Hemodynamically stable  Plan: Colonic prep today Colonoscopy at 8 AM with Dr. Watt Climes tomorrow The risks and the benefits of the procedure were discussed with the patient in details. She understands and verbalizes consent.  [name], MD 04/02/2021, 10:27 AM

## 2021-04-02 NOTE — Plan of Care (Signed)
  Problem: Education: Goal: Knowledge of General Education information will improve Description: Including pain rating scale, medication(s)/side effects and non-pharmacologic comfort measures Outcome: Progressing   Problem: Health Behavior/Discharge Planning: Goal: Ability to manage health-related needs will improve Outcome: Progressing   Problem: Clinical Measurements: Goal: Ability to maintain clinical measurements within normal limits will improve Outcome: Progressing Goal: Will remain free from infection Outcome: Progressing Goal: Diagnostic test results will improve Outcome: Progressing Goal: Cardiovascular complication will be avoided Outcome: Progressing   Problem: Activity: Goal: Risk for activity intolerance will decrease Outcome: Progressing   Problem: Nutrition: Goal: Adequate nutrition will be maintained Outcome: Progressing   Problem: Coping: Goal: Level of anxiety will decrease Outcome: Progressing   Problem: Elimination: Goal: Will not experience complications related to bowel motility Outcome: Progressing Goal: Will not experience complications related to urinary retention Outcome: Progressing

## 2021-04-02 NOTE — Progress Notes (Signed)
RT note. Patient set up on auto titrate cpap 18/5. Patient states she wears a cpap at home but is unaware of her settings. RN aware, RT will continue to monitor.

## 2021-04-02 NOTE — Progress Notes (Signed)
PROGRESS NOTE    [name]  KPT:[id number] DOB: [birth year] DOA: 03/31/2021 PCP: [name], MD   Chief Complaint  Patient presents with   Loss of Consciousness    Brief Narrative:    [name] is a 75 y.o. female with medical history significant for chronic iron deficiency anemia with baseline hemoglobin 9-12, diverticulosis, chronic diastolic heart failure, COPD, type 2 diabetes mellitus, seizure disorder, who is admitted to [facility] on 03/31/2021 with acute lower gastrointestinal bleed after presenting from home to [facility] ED complaining of loss of consciousness.  Work-up significant for lower GI bleed with hemoglobin of 5.1, she is admitted for further work-up.    Assessment & Plan:   Principal Problem:   Acute lower GI bleeding Active Problems:   Dyslipidemia, goal LDL below 100   DM2 (diabetes mellitus, type 2) (HCC)   Seizure (HCC)   Chronic diastolic CHF (congestive heart failure), NYHA class 2 (HCC)   Syncope   Acute on chronic blood loss anemia   Hypokalemia   Lower GI bleed/acute blood loss anemia -Is most likely GI related to diverticular bleed, however patient has history of tubular adenoma in 2014 and nonspecific colitis noted in 2018. -Hemoglobin significantly low at 5.1 on admission, he received 2 units with good response, repeat remained steady 9.4> 9.3>  9.6  -GI input greatly appreciated, plan for colonoscopy tomorrow by Dr. Driscilla Grammes, colonic prep today. Syncope:  -Is most likely in the setting of acute blood loss anemia in the setting of lower GI bleed. -Continue to hold Coreg and Norvasc, hydralazine, Imdur.   Hypokalemia:  -repleted   Chronic diastolic heart failure:  - most recent echocardiogram performed in May 2022 demonstrating grade 1 diastolic dysfunction, with additional details further described above.  -No evidence of decompensated heart failure  -We will continue to hold Lasix given her significant anemia, but I will  hold on initiating any IV fluid especially she received blood .     COPD:  -  Outpatient respiratory regimen includes Spiriva as well as as needed albuterol inhaler.  No evidence to suggest acute exacerbation at this time.    Type 2 diabetes mellitus:  - most recent hemoglobin A1c noted to be 7.3%  -  home meds include basal insulin in the form of Toujeo 25 units sq HS as well as short acting insulin sliding scale Humalog 3 times daily with meals.  Continue long-acting insulin at a lower dose and insulin sliding scale.    Essential Hypertension: -Blood pressure is acceptable, will resume Coreg at a lower dose 6.25 mg instead of 25 mg.    Hyperlipidemia:  -Hold home statin for now   Seizure disorder:  Resume home emds   DVT prophylaxis: SCD Code Status: Full Family Communication: None at bedside Disposition:   Status is: Observation  The patient will require care spanning > 2 midnights and should be moved to inpatient because: Ongoing diagnostic testing needed not appropriate for outpatient work up  Dispo: The patient is from: Home              Anticipated d/c is to: Home              Patient currently is not medically stable to d/c.   Difficult to place patient No       Consultants:  GI   Subjective:   No significant events overnight, she does report 1 bowel movement, with some blood.  Objective: Vitals:   04/01/21 2357  04/02/21 0358 04/02/21 0754 04/02/21 1211  BP: 139/68 103/76 (!) 149/73 (!) 117/58  Pulse: 66  65   Resp: 19 17 18 19   Temp: 98.1 F (36.7 C) 97.9 F (36.6 C) 98.1 F (36.7 C) 97.7 F (36.5 C)  TempSrc: Oral Oral Oral Oral  SpO2: 99% 99%    Weight:  87.9 kg    Height:        Intake/Output Summary (Last 24 hours) at 04/02/2021 1521 Last data filed at 04/02/2021 0600 Gross per 24 hour  Intake 480 ml  Output 1100 ml  Net -620 ml   Filed Weights   03/31/21 2049 04/02/21 0358  Weight: 87.5 kg 87.9 kg    Examination:  Awake Alert,  Oriented X 3, No new F.N deficits, Normal affect Symmetrical Chest wall movement, Good air movement bilaterally, CTAB RRR,No Gallops,Rubs or new Murmurs, No Parasternal Heave +ve B.Sounds, Abd Soft, No tenderness, No rebound - guarding or rigidity. No Cyanosis, Clubbing or edema, No new Rash or bruise    Data Reviewed: I have personally reviewed following labs and imaging studies  CBC: Recent Labs  Lab 03/31/21 2044 04/01/21 0855 04/01/21 1600 04/02/21 0139  WBC 5.3 4.2 4.9 5.2  NEUTROABS 2.8  --   --   --   HGB 5.1* 9.4* 9.3* 9.6*  HCT 16.6* 29.0* 29.5* 29.8*  MCV 76.5* 76.9* 77.2* 75.6*  PLT 262 252 258 846    Basic Metabolic Panel: Recent Labs  Lab 03/27/21 1536 03/31/21 2044 04/01/21 0855 04/02/21 0139  NA 137 133* 138 136  K 3.9 3.3* 3.8 3.2*  CL 96 97* 103 102  CO2 26 25 27 28   GLUCOSE 85 185* 84 107*  BUN 16 14 13  6*  CREATININE 0.82 0.88 0.67 0.70  CALCIUM 9.6 8.6* 8.8* 8.8*  MG  --   --  2.0  --     GFR: Estimated Creatinine Clearance: 64 mL/min (by C-G formula based on SCr of 0.7 mg/dL).  Liver Function Tests: Recent Labs  Lab 03/31/21 2044 04/01/21 0855  AST 20 17  ALT 14 14  ALKPHOS 102 88  BILITOT 0.2* 1.5*  PROT 6.6 6.6  ALBUMIN 3.2* 3.1*    CBG: Recent Labs  Lab 04/01/21 1125 04/01/21 1744 04/01/21 2052 04/02/21 0808 04/02/21 1219  GLUCAP 81 89 178* 104* 148*     Recent Results (from the past 240 hour(s))  Resp Panel by RT-PCR (Flu A&B, Covid) Nasopharyngeal Swab     Status: None   Collection Time: 03/31/21 11:13 PM   Specimen: Nasopharyngeal Swab; Nasopharyngeal(NP) swabs in vial transport medium  Result Value Ref Range Status   SARS Coronavirus 2 by RT PCR NEGATIVE NEGATIVE Final    Comment: (NOTE) SARS-CoV-2 target nucleic acids are NOT DETECTED.  The SARS-CoV-2 RNA is generally detectable in upper respiratory specimens during the acute phase of infection. The lowest concentration of SARS-CoV-2 viral copies this assay  can detect is 138 copies/mL. A negative result does not preclude SARS-Cov-2 infection and should not be used as the sole basis for treatment or other patient management decisions. A negative result may occur with  improper specimen collection/handling, submission of specimen other than nasopharyngeal swab, presence of viral mutation(s) within the areas targeted by this assay, and inadequate number of viral copies(<138 copies/mL). A negative result must be combined with clinical observations, patient history, and epidemiological information. The expected result is Negative.  Fact Sheet for Patients:  EntrepreneurPulse.com.au  Fact Sheet for Healthcare Providers:  IncredibleEmployment.be  This test is no t yet approved or cleared by the [place] and  has been authorized for detection and/or diagnosis of SARS-CoV-2 by FDA under an Emergency Use Authorization (EUA). This EUA will remain  in effect (meaning this test can be used) for the duration of the COVID-19 declaration under Section 564(b)(1) of the Act, 21 U.S.C.section 360bbb-3(b)(1), unless the authorization is terminated  or revoked sooner.       Influenza A by PCR NEGATIVE NEGATIVE Final   Influenza B by PCR NEGATIVE NEGATIVE Final    Comment: (NOTE) The Xpert Xpress SARS-CoV-2/FLU/RSV plus assay is intended as an aid in the diagnosis of influenza from Nasopharyngeal swab specimens and should not be used as a sole basis for treatment. Nasal washings and aspirates are unacceptable for Xpert Xpress SARS-CoV-2/FLU/RSV testing.  Fact Sheet for Patients: EntrepreneurPulse.com.au  Fact Sheet for Healthcare Providers: IncredibleEmployment.be  This test is not yet approved or cleared by the [place] FDA and has been authorized for detection and/or diagnosis of SARS-CoV-2 by FDA under an Emergency Use Authorization (EUA). This EUA will  remain in effect (meaning this test can be used) for the duration of the COVID-19 declaration under Section 564(b)(1) of the Act, 21 U.S.C. section 360bbb-3(b)(1), unless the authorization is terminated or revoked.  Performed at [facility], [address], [place], [place] [address]          Radiology Studies: CT HEAD WO CONTRAST (5MM)  Result Date: 03/31/2021 CLINICAL DATA:  Seizure.  Followed by fall. EXAM: CT HEAD WITHOUT CONTRAST CT CERVICAL SPINE WITHOUT CONTRAST TECHNIQUE: Multidetector CT imaging of the head and cervical spine was performed following the standard protocol without intravenous contrast. Multiplanar CT image reconstructions of the cervical spine were also generated. COMPARISON:  CT head 01/28/2020, MR head 11/10/2019 FINDINGS: CT HEAD FINDINGS BRAIN: BRAIN Patchy and confluent areas of decreased attenuation are noted throughout the deep and periventricular white matter of the cerebral hemispheres bilaterally, compatible with chronic microvascular ischemic disease. No evidence of large-territorial acute infarction. No parenchymal hemorrhage. No mass lesion. No extra-axial collection. No mass effect or midline shift. No hydrocephalus. Basilar cisterns are patent. Partial empty sella. Vascular: No hyperdense vessel. Atherosclerotic calcifications are present within the cavernous internal carotid arteries. Skull: No acute fracture or focal lesion. Sinuses/Orbits: Paranasal sinuses and mastoid air cells are clear. Bilateral lens replacement. Otherwise orbits are unremarkable. Other: None. CT CERVICAL SPINE FINDINGS Alignment: Normal. Skull base and vertebrae: Multilevel degenerative changes of the spine. No acute fracture. No aggressive appearing focal osseous lesion or focal pathologic process. Soft tissues and spinal canal: No prevertebral fluid or swelling. No visible canal hematoma. Upper chest: Biapical pleural/pulmonary scarring. Emphysematous changes. Other: None.  IMPRESSION: 1. No acute intracranial abnormality. 2. No acute displaced fracture or traumatic listhesis of the cervical spine. 3.  Emphysema (ICD10-J43.9). 4. Persistent partial empty sella. Findings is often a normal anatomic variant but can be associated with idiopathic intracranial hypertension (pseudotumor cerebri). Electronically Signed   By: Iven Finn M.D.   On: 03/31/2021 22:16   CT Cervical Spine Wo Contrast  Result Date: 03/31/2021 CLINICAL DATA:  Seizure.  Followed by fall. EXAM: CT HEAD WITHOUT CONTRAST CT CERVICAL SPINE WITHOUT CONTRAST TECHNIQUE: Multidetector CT imaging of the head and cervical spine was performed following the standard protocol without intravenous contrast. Multiplanar CT image reconstructions of the cervical spine were also generated. COMPARISON:  CT head 01/28/2020, MR head 11/10/2019 FINDINGS: CT HEAD FINDINGS BRAIN: BRAIN Patchy and  confluent areas of decreased attenuation are noted throughout the deep and periventricular white matter of the cerebral hemispheres bilaterally, compatible with chronic microvascular ischemic disease. No evidence of large-territorial acute infarction. No parenchymal hemorrhage. No mass lesion. No extra-axial collection. No mass effect or midline shift. No hydrocephalus. Basilar cisterns are patent. Partial empty sella. Vascular: No hyperdense vessel. Atherosclerotic calcifications are present within the cavernous internal carotid arteries. Skull: No acute fracture or focal lesion. Sinuses/Orbits: Paranasal sinuses and mastoid air cells are clear. Bilateral lens replacement. Otherwise orbits are unremarkable. Other: None. CT CERVICAL SPINE FINDINGS Alignment: Normal. Skull base and vertebrae: Multilevel degenerative changes of the spine. No acute fracture. No aggressive appearing focal osseous lesion or focal pathologic process. Soft tissues and spinal canal: No prevertebral fluid or swelling. No visible canal hematoma. Upper chest: Biapical  pleural/pulmonary scarring. Emphysematous changes. Other: None. IMPRESSION: 1. No acute intracranial abnormality. 2. No acute displaced fracture or traumatic listhesis of the cervical spine. 3.  Emphysema (ICD10-J43.9). 4. Persistent partial empty sella. Findings is often a normal anatomic variant but can be associated with idiopathic intracranial hypertension (pseudotumor cerebri). Electronically Signed   By: Iven Finn M.D.   On: 03/31/2021 22:16   DG Chest Port 1 View  Result Date: 04/01/2021 CLINICAL DATA:  75 year old female with history of seizure like activity. EXAM: PORTABLE CHEST 1 VIEW COMPARISON:  Chest x-ray 12/23/2020. FINDINGS: Lung volumes are normal. Patchy ill-defined opacities and areas of interstitial prominence are noted in the lungs bilaterally, most evident in the mid to lower lungs. No confluent consolidative airspace disease. No pleural effusions. No pneumothorax. No evidence of pulmonary edema. Heart size is mildly enlarged. Upper mediastinal contours are within normal limits. Small left trunk device projecting over the medial left hemithorax, similar to the prior study. IMPRESSION: 1. The appearance of the lungs is concerning for potential multilobar bilateral pneumonia. Clinical correlation for signs and symptoms of viral infection is suggested. 2. Mild cardiomegaly. Electronically Signed   By: Vinnie Langton M.D.   On: 04/01/2021 05:42        Scheduled Meds:  atorvastatin  10 mg Oral QPM   carvedilol  6.25 mg Oral BID WC   insulin aspart  0-6 Units Subcutaneous TID WC   insulin glargine-yfgn  6 Units Subcutaneous QHS   lamoTRIgine  150 mg Oral BID   levETIRAcetam  500 mg Oral BID   pantoprazole (PROTONIX) IV  40 mg Intravenous Q24H   potassium chloride  40 mEq Oral Q6H   umeclidinium bromide  1 puff Inhalation Daily   Continuous Infusions:     LOS: 0 days       Phillips Climes, MD [facility]   To contact the attending provider between  7A-7P or the covering provider during after hours 7P-7A, please log into the web site [email or web address] and access using universal [place] password for that web site. If you do not have the password, please call the hospital operator.  04/02/2021, 3:21 PM

## 2021-04-03 ENCOUNTER — Encounter (HOSPITAL_COMMUNITY)
Admission: EM | Disposition: A | Discharge status: Home / Self Care | Payer: Self-pay | Source: Home / Self Care | Attending: Emergency Medicine

## 2021-04-03 ENCOUNTER — Encounter (HOSPITAL_COMMUNITY): Payer: Self-pay | Admitting: Internal Medicine

## 2021-04-03 ENCOUNTER — Telehealth: Payer: Self-pay

## 2021-04-03 ENCOUNTER — Observation Stay (HOSPITAL_COMMUNITY): Payer: Medicare HMO | Admitting: Registered Nurse

## 2021-04-03 DIAGNOSIS — D62 Acute posthemorrhagic anemia: Secondary | ICD-10-CM | POA: Diagnosis not present

## 2021-04-03 DIAGNOSIS — K635 Polyp of colon: Secondary | ICD-10-CM | POA: Diagnosis not present

## 2021-04-03 DIAGNOSIS — K5730 Diverticulosis of large intestine without perforation or abscess without bleeding: Secondary | ICD-10-CM | POA: Diagnosis not present

## 2021-04-03 DIAGNOSIS — I5032 Chronic diastolic (congestive) heart failure: Secondary | ICD-10-CM | POA: Diagnosis not present

## 2021-04-03 DIAGNOSIS — D124 Benign neoplasm of descending colon: Secondary | ICD-10-CM | POA: Diagnosis not present

## 2021-04-03 DIAGNOSIS — Z20822 Contact with and (suspected) exposure to covid-19: Secondary | ICD-10-CM | POA: Diagnosis not present

## 2021-04-03 DIAGNOSIS — Z8616 Personal history of COVID-19: Secondary | ICD-10-CM | POA: Diagnosis not present

## 2021-04-03 DIAGNOSIS — K5731 Diverticulosis of large intestine without perforation or abscess with bleeding: Secondary | ICD-10-CM | POA: Diagnosis not present

## 2021-04-03 DIAGNOSIS — I130 Hypertensive heart and chronic kidney disease with heart failure and stage 1 through stage 4 chronic kidney disease, or unspecified chronic kidney disease: Secondary | ICD-10-CM | POA: Diagnosis not present

## 2021-04-03 DIAGNOSIS — K922 Gastrointestinal hemorrhage, unspecified: Secondary | ICD-10-CM | POA: Diagnosis not present

## 2021-04-03 DIAGNOSIS — N189 Chronic kidney disease, unspecified: Secondary | ICD-10-CM | POA: Diagnosis not present

## 2021-04-03 DIAGNOSIS — K921 Melena: Secondary | ICD-10-CM | POA: Diagnosis not present

## 2021-04-03 DIAGNOSIS — Z9989 Dependence on other enabling machines and devices: Secondary | ICD-10-CM | POA: Diagnosis not present

## 2021-04-03 DIAGNOSIS — G4733 Obstructive sleep apnea (adult) (pediatric): Secondary | ICD-10-CM | POA: Diagnosis not present

## 2021-04-03 DIAGNOSIS — J449 Chronic obstructive pulmonary disease, unspecified: Secondary | ICD-10-CM | POA: Diagnosis not present

## 2021-04-03 HISTORY — PX: COLONOSCOPY WITH PROPOFOL: SHX5780

## 2021-04-03 HISTORY — PX: POLYPECTOMY: SHX5525

## 2021-04-03 LAB — CBC
HCT: 30.6 % — ABNORMAL LOW (ref 36.0–46.0)
Hemoglobin: 9.7 g/dL — ABNORMAL LOW (ref 12.0–15.0)
MCH: 24.7 pg — ABNORMAL LOW (ref 26.0–34.0)
MCHC: 31.7 g/dL (ref 30.0–36.0)
MCV: 77.9 fL — ABNORMAL LOW (ref 80.0–100.0)
Platelets: 259 10*3/uL (ref 150–400)
RBC: 3.93 MIL/uL (ref 3.87–5.11)
RDW: 17.3 % — ABNORMAL HIGH (ref 11.5–15.5)
WBC: 6.2 10*3/uL (ref 4.0–10.5)
nRBC: 0 % (ref 0.0–0.2)

## 2021-04-03 LAB — BASIC METABOLIC PANEL
Anion gap: 7 (ref 5–15)
BUN: <5 mg/dL — ABNORMAL LOW (ref 8–23)
CO2: 25 mmol/L (ref 22–32)
Calcium: 8.8 mg/dL — ABNORMAL LOW (ref 8.9–10.3)
Chloride: 101 mmol/L (ref 98–111)
Creatinine, Ser: 0.68 mg/dL (ref 0.44–1.00)
GFR, Estimated: >60 mL/min (ref >60)
Glucose, Bld: 110 mg/dL — ABNORMAL HIGH (ref 70–99)
Potassium: 3.9 mmol/L (ref 3.5–5.1)
Sodium: 133 mmol/L — ABNORMAL LOW (ref 135–145)

## 2021-04-03 LAB — GLUCOSE, CAPILLARY
Glucose-Capillary: 126 mg/dL — ABNORMAL HIGH (ref 70–99)
Glucose-Capillary: 92 mg/dL (ref 70–99)
Glucose-Capillary: 83 mg/dL (ref 70–99)

## 2021-04-03 SURGERY — COLONOSCOPY WITH PROPOFOL
Anesthesia: Monitor Anesthesia Care

## 2021-04-03 MED ORDER — FUROSEMIDE 80 MG PO TABS: 40.0000 mg | ORAL_TABLET | Freq: Every day | ORAL | 3 refills | Status: DC

## 2021-04-03 MED ORDER — OXYCODONE HCL 5 MG PO TABS: 5.0000 mg | ORAL_TABLET | Freq: Once | ORAL | Status: DC | PRN

## 2021-04-03 MED ORDER — CARVEDILOL 25 MG PO TABS: 12.5000 mg | ORAL_TABLET | Freq: Two times a day (BID) | ORAL | 2 refills | Status: DC

## 2021-04-03 MED ORDER — LACTATED RINGERS IV SOLN
INTRAVENOUS | Status: AC | PRN
  Administered 2021-04-03: 20 mL/h via INTRAVENOUS

## 2021-04-03 MED ORDER — LIDOCAINE 2% (20 MG/ML) 5 ML SYRINGE
INTRAMUSCULAR | Status: DC | PRN
  Administered 2021-04-03: 60 mg via INTRAVENOUS

## 2021-04-03 MED ORDER — PROPOFOL 500 MG/50ML IV EMUL
INTRAVENOUS | Status: DC | PRN
  Administered 2021-04-03: 75 ug/kg/min via INTRAVENOUS

## 2021-04-03 MED ORDER — ONDANSETRON HCL 4 MG/2ML IJ SOLN: 4.0000 mg | Freq: Four times a day (QID) | INTRAMUSCULAR | Status: DC | PRN

## 2021-04-03 MED ORDER — PROPOFOL 10 MG/ML IV BOLUS
INTRAVENOUS | Status: DC | PRN
  Administered 2021-04-03: 50 mg via INTRAVENOUS
  Administered 2021-04-03: 30 mg via INTRAVENOUS

## 2021-04-03 MED ORDER — OXYCODONE HCL 5 MG/5ML PO SOLN: 5.0000 mg | Freq: Once | ORAL | Status: DC | PRN

## 2021-04-03 MED ORDER — FENTANYL CITRATE (PF) 100 MCG/2ML IJ SOLN: 25.0000 ug | INTRAMUSCULAR | Status: DC | PRN

## 2021-04-03 MED ORDER — SODIUM CHLORIDE 0.9 % IV SOLN: INTRAVENOUS | Status: DC

## 2021-04-03 MED ORDER — HYDRALAZINE HCL 100 MG PO TABS
50.0000 mg | ORAL_TABLET | Freq: Three times a day (TID) | ORAL | 3 refills | Status: DC
Start: 2021-04-03 — End: 2021-05-04

## 2021-04-03 SURGICAL SUPPLY — 22 items

## 2021-04-03 NOTE — Discharge Summary (Signed)
Physician Discharge Summary  [name] CBS:[id number] DOB: 01/28/1946 DOA: 03/31/2021  PCP: [name], MD  Admit date: 03/31/2021 Discharge date: 04/03/2021  Admitted From: Home Disposition:  Home   Recommendations for Outpatient Follow-up:  Follow up with PCP in 1-2 weeks Please obtain BMP/CBC in one week Please follow up on the following pending results:  Home Health:NO  Discharge Condition:Stable CODE STATUS:FULL Diet recommendation: Heart Healthy / Carb Modified   Brief/Interim Summary:   RECHELLE [name] is a 75 y.o. female with medical history significant for chronic iron deficiency anemia with baseline hemoglobin 9-12, diverticulosis, chronic diastolic heart failure, COPD, type 2 diabetes mellitus, seizure disorder, who is admitted to [facility] on 03/31/2021 with acute lower gastrointestinal bleed after presenting from home to [facility] ED complaining of loss of consciousness.  Work-up significant for lower GI bleed with hemoglobin of 5.1, she is admitted for further work-up.    Lower GI bleed/acute blood loss anemia -Is most likely GI related to diverticular bleed, however patient has history of tubular adenoma in 2014 and nonspecific colitis noted in 2018. -Hemoglobin significantly low at 5.1 on admission, she received 2 units with good response, repeat remained steady 9.4> 9.3>  9.6   , it is 9.7 at day of discharge  -GI input greatly appreciated, she went for colonoscopy 04/03/2021, which was significant for diverticulosis in the sigmoid, descending and transverse colon, as well for 2 small polyps in the proximal descending and in the mid descending colon removed with cold snare's.   -She is resumed on soft diet, to follow with GI as an outpatient.  Syncope:  -Is most likely in the setting of acute blood loss anemia in the setting of lower GI bleed. -Please see discussion below under hypertension.     Hypokalemia:  -repleted   Chronic diastolic heart  failure:  - most recent echocardiogram performed in May 2022 demonstrating grade 1 diastolic dysfunction, with additional details further described above.  -No evidence of decompensated heart failure  -No evidence of volume overload at time of discharge, she will be resumed on Lasix at a lower dose, and to start in 4 days from discharge.      COPD:  -  Outpatient respiratory regimen includes Spiriva as well as as needed albuterol inhaler.  No evidence to suggest acute exacerbation at this time.    Type 2 diabetes mellitus:  - most recent hemoglobin A1c noted to be 7.3%  -Resume home regimen on discharge    Essential Hypertension: -With syncope, blood pressure during hospital stay has been acceptable only on lower dose Coreg, so her medication has been adjusted on discharge, she is to discontinue amlodipine, and to decrease her Coreg dose, hydralazine dose by half, her Lasix has been decreased as well, and to continue with Imdur.     Hyperlipidemia:  -Continue with home medications   Seizure disorder:  Resume home emds   Discharge Diagnoses:  Principal Problem:   Acute lower GI bleeding Active Problems:   Dyslipidemia, goal LDL below 100   DM2 (diabetes mellitus, type 2) (HCC)   Seizure (HCC)   Chronic diastolic CHF (congestive heart failure), NYHA class 2 (HCC)   Syncope   Acute on chronic blood loss anemia   Hypokalemia    Discharge Instructions  Discharge Instructions     Discharge instructions   Complete by: As directed    Follow with Primary MD [name], MD in 7 days   Get CBC, CMP,  checked  by Primary MD next visit.    Activity: As tolerated with Full fall precautions use walker/cane & assistance as needed   Disposition Home    Diet: Heart Healthy/carb modified   On your next visit with your primary care physician please Get Medicines reviewed and adjusted.   Please request your Prim.MD to go over all Hospital Tests and Procedure/Radiological  results at the follow up, please get all Hospital records sent to your Prim MD by signing hospital release before you go home.   If you experience worsening of your admission symptoms, develop shortness of breath, life threatening emergency, suicidal or homicidal thoughts you must seek medical attention immediately by calling 911 or calling your MD immediately  if symptoms less severe.  You Must read complete instructions/literature along with all the possible adverse reactions/side effects for all the Medicines you take and that have been prescribed to you. Take any new Medicines after you have completely understood and accpet all the possible adverse reactions/side effects.   Do not drive, operating heavy machinery, perform activities at heights, swimming or participation in water activities or provide baby sitting services if your were admitted for syncope or siezures until you have seen by Primary MD or a Neurologist and advised to do so again.  Do not drive when taking Pain medications.    Do not take more than prescribed Pain, Sleep and Anxiety Medications  Special Instructions: If you have smoked or chewed Tobacco  in the last 2 yrs please stop smoking, stop any regular Alcohol  and or any Recreational drug use.  Wear Seat belts while driving.   Please note  You were cared for by a hospitalist during your hospital stay. If you have any questions about your discharge medications or the care you received while you were in the hospital after you are discharged, you can call the unit and asked to speak with the hospitalist on call if the hospitalist that took care of you is not available. Once you are discharged, your primary care physician will handle any further medical issues. Please note that NO REFILLS for any discharge medications will be authorized once you are discharged, as it is imperative that you return to your primary care physician (or establish a relationship with a primary  care physician if you do not have one) for your aftercare needs so that they can reassess your need for medications and monitor your lab values.   Increase activity slowly   Complete by: As directed       Allergies as of 04/03/2021       Reactions   Other Anaphylaxis, Rash   Bleach   Penicillins Hives   Did it involve swelling of the face/tongue/throat, SOB, or low BP? No Did it involve sudden or severe rash/hives, skin peeling, or any reaction on the inside of your mouth or nose? Yes Did you need to seek medical attention at a hospital or doctor's office? Yes When did it last happen?      Childhood allergy  If all above answers are "NO", may proceed with cephalosporin use.   Sulfa Antibiotics Hives   Aspirin Other (See Comments)    On aspirin 81 mg - Rectal bleeding in dec 2018   Codeine    Headache and makes the patient feel "off"        Medication List     STOP taking these medications    amLODipine 5 MG tablet Commonly known as: NORVASC  TAKE these medications    acetaminophen 500 MG tablet Commonly known as: TYLENOL Take 500-1,000 mg by mouth every 4 (four) hours as needed for moderate pain.   albuterol 108 (90 Base) MCG/ACT inhaler Commonly known as: VENTOLIN HFA Inhale 1-2 puffs into the lungs every 6 (six) hours as needed for wheezing or shortness of breath.   Alcohol Wipes 70 % Misc Apply 1 each topically 2 (two) times daily.   atorvastatin 10 MG tablet Commonly known as: LIPITOR TAKE 1 TABLET EVERY EVENING.   B-D SINGLE USE SWABS REGULAR Pads USE TWICE DAILY AS DIRECTED   B-D ULTRAFINE III SHORT PEN 31G X 8 MM Misc Generic drug: Insulin Pen Needle USE DAILY WITH VICTOZA AND/OR NOVOLOG.   benzonatate 100 MG capsule Commonly known as: Tessalon Perles Take 1 capsule (100 mg total) by mouth 3 (three) times daily as needed for cough.   carvedilol 25 MG tablet Commonly known as: COREG Take 0.5 tablets (12.5 mg total) by mouth 2 (two) times  daily with a meal. What changed: See the new instructions.   dexlansoprazole 60 MG capsule Commonly known as: Dexilant Take 1 capsule (60 mg total) by mouth daily. What changed: when to take this   diclofenac sodium 1 % Gel Commonly known as: VOLTAREN Apply 2 g topically 4 (four) times daily as needed (pain).   ferrous sulfate 324 MG Tbec Take 324 mg by mouth 2 (two) times daily.   furosemide 80 MG tablet Commonly known as: LASIX TAKE 1 TABLET DAILY ON MONDAY THROUGH FRIDAY, AND 1/2 TABLET ON SATURDAY AND SUNDAY   guaiFENesin-dextromethorphan 100-10 MG/5ML syrup Commonly known as: ROBITUSSIN DM Take 10 mLs by mouth every 6 (six) hours as needed for cough.   Gvoke HypoPen 1-Pack 0.5 MG/0.1ML Soaj Generic drug: Glucagon Inject 0.5 mg into the skin daily as needed.   hydrALAZINE 100 MG tablet Commonly known as: APRESOLINE Take 0.5 tablets (50 mg total) by mouth 3 (three) times daily. What changed: how much to take   hydrocortisone cream 1 % Apply 1 application topically daily as needed for itching.   insulin lispro 100 UNIT/ML KwikPen Commonly known as: HumaLOG KwikPen Inject 0.02-0.04 mLs (2-4 Units total) into the skin 3 (three) times daily as needed (high blood sugar over 150).   isosorbide mononitrate 30 MG 24 hr tablet Commonly known as: IMDUR TAKE 1 TABLET EVERY DAY (APPOINTMENT IS NEEDED FOR REFILLS)   lamoTRIgine 150 MG tablet Commonly known as: LaMICtal Take 1 tablet (150 mg total) by mouth 2 (two) times daily.   levETIRAcetam 500 MG 24 hr tablet Commonly known as: KEPPRA XR Take 3 tablets every night What changed:  how much to take how to take this when to take this additional instructions   ondansetron 4 MG tablet Commonly known as: ZOFRAN Take 4 mg by mouth every 8 (eight) hours as needed for nausea or vomiting.   Ozempic (1 MG/DOSE) 4 MG/3ML Sopn Generic drug: Semaglutide (1 MG/DOSE) INJECT 1 MG INTO THE SKIN ONCE A WEEK.   Pancrelipase  (Lip-Prot-Amyl) 25000-79000 units Cpep Take 1-2 capsules by mouth See admin instructions. Take 1 capsules in the morning with breakfast, 1 capsule with lunch, and 2 capsules with supper   potassium chloride SA 20 MEQ tablet Commonly known as: KLOR-CON TAKE 2 TABLETS EVERY DAY BUT WHEN TAKING METOLAZONE TAKE 3 TABLETS DAILY AS DIRECTED What changed: See the new instructions.   sennosides-docusate sodium 8.6-50 MG tablet Commonly known as: SENOKOT-S Take 1-2 tablets by mouth  at bedtime as needed for constipation.   sertraline 50 MG tablet Commonly known as: ZOLOFT TAKE 1 TABLET EVERY DAY   Spiriva HandiHaler 18 MCG inhalation capsule Generic drug: tiotropium Place 1 capsule (18 mcg total) into inhaler and inhale daily.   tolnaftate 1 % cream Commonly known as: TINACTIN Apply 1 application topically daily as needed (foot fungus).   Toujeo SoloStar 300 UNIT/ML Solostar Pen Generic drug: insulin glargine (1 Unit Dial) Inject 25 Units into the skin at bedtime.   True Metrix Blood Glucose Test test strip Generic drug: glucose blood USE AS DIRECTED TO CHECK BLOOD SUGARS 2 TIMES PER DAY   True Metrix Level 1 Low Soln Use as directed  Dx:e11.22   True Metrix Meter w/Device Kit Use as directed to check blood sugars 2 times per day dx: e11.22   TRUEplus Lancets 33G Misc Use as directed to check blood sugars 2 times per day dx: e11.22   vitamin C 1000 MG tablet Take 1,000 mg by mouth daily.        Follow-up Information     [name], MD Follow up.   Specialty: Internal Medicine Contact information: [address] [place] [address] [phone]                Allergies  Allergen Reactions   Other Anaphylaxis and Rash    Bleach   Penicillins Hives    Did it involve swelling of the face/tongue/throat, SOB, or low BP? No Did it involve sudden or severe rash/hives, skin peeling, or any reaction on the inside of your mouth or nose? Yes Did you  need to seek medical attention at a hospital or doctor's office? Yes When did it last happen?      Childhood allergy  If all above answers are "NO", may proceed with cephalosporin use.     Sulfa Antibiotics Hives   Aspirin Other (See Comments)     On aspirin 81 mg - Rectal bleeding in dec 2018   Codeine     Headache and makes the patient feel "off"    Consultations: GI   Procedures/Studies: CT HEAD WO CONTRAST (5MM)  Result Date: 03/31/2021 CLINICAL DATA:  Seizure.  Followed by fall. EXAM: CT HEAD WITHOUT CONTRAST CT CERVICAL SPINE WITHOUT CONTRAST TECHNIQUE: Multidetector CT imaging of the head and cervical spine was performed following the standard protocol without intravenous contrast. Multiplanar CT image reconstructions of the cervical spine were also generated. COMPARISON:  CT head 01/28/2020, MR head 11/10/2019 FINDINGS: CT HEAD FINDINGS BRAIN: BRAIN Patchy and confluent areas of decreased attenuation are noted throughout the deep and periventricular white matter of the cerebral hemispheres bilaterally, compatible with chronic microvascular ischemic disease. No evidence of large-territorial acute infarction. No parenchymal hemorrhage. No mass lesion. No extra-axial collection. No mass effect or midline shift. No hydrocephalus. Basilar cisterns are patent. Partial empty sella. Vascular: No hyperdense vessel. Atherosclerotic calcifications are present within the cavernous internal carotid arteries. Skull: No acute fracture or focal lesion. Sinuses/Orbits: Paranasal sinuses and mastoid air cells are clear. Bilateral lens replacement. Otherwise orbits are unremarkable. Other: None. CT CERVICAL SPINE FINDINGS Alignment: Normal. Skull base and vertebrae: Multilevel degenerative changes of the spine. No acute fracture. No aggressive appearing focal osseous lesion or focal pathologic process. Soft tissues and spinal canal: No prevertebral fluid or swelling. No visible canal hematoma. Upper chest:  Biapical pleural/pulmonary scarring. Emphysematous changes. Other: None. IMPRESSION: 1. No acute intracranial abnormality. 2. No acute displaced fracture or traumatic listhesis of  the cervical spine. 3.  Emphysema (ICD10-J43.9). 4. Persistent partial empty sella. Findings is often a normal anatomic variant but can be associated with idiopathic intracranial hypertension (pseudotumor cerebri). Electronically Signed   By: Iven Finn M.D.   On: 03/31/2021 22:16   CT Cervical Spine Wo Contrast  Result Date: 03/31/2021 CLINICAL DATA:  Seizure.  Followed by fall. EXAM: CT HEAD WITHOUT CONTRAST CT CERVICAL SPINE WITHOUT CONTRAST TECHNIQUE: Multidetector CT imaging of the head and cervical spine was performed following the standard protocol without intravenous contrast. Multiplanar CT image reconstructions of the cervical spine were also generated. COMPARISON:  CT head 01/28/2020, MR head 11/10/2019 FINDINGS: CT HEAD FINDINGS BRAIN: BRAIN Patchy and confluent areas of decreased attenuation are noted throughout the deep and periventricular white matter of the cerebral hemispheres bilaterally, compatible with chronic microvascular ischemic disease. No evidence of large-territorial acute infarction. No parenchymal hemorrhage. No mass lesion. No extra-axial collection. No mass effect or midline shift. No hydrocephalus. Basilar cisterns are patent. Partial empty sella. Vascular: No hyperdense vessel. Atherosclerotic calcifications are present within the cavernous internal carotid arteries. Skull: No acute fracture or focal lesion. Sinuses/Orbits: Paranasal sinuses and mastoid air cells are clear. Bilateral lens replacement. Otherwise orbits are unremarkable. Other: None. CT CERVICAL SPINE FINDINGS Alignment: Normal. Skull base and vertebrae: Multilevel degenerative changes of the spine. No acute fracture. No aggressive appearing focal osseous lesion or focal pathologic process. Soft tissues and spinal canal: No  prevertebral fluid or swelling. No visible canal hematoma. Upper chest: Biapical pleural/pulmonary scarring. Emphysematous changes. Other: None. IMPRESSION: 1. No acute intracranial abnormality. 2. No acute displaced fracture or traumatic listhesis of the cervical spine. 3.  Emphysema (ICD10-J43.9). 4. Persistent partial empty sella. Findings is often a normal anatomic variant but can be associated with idiopathic intracranial hypertension (pseudotumor cerebri). Electronically Signed   By: Iven Finn M.D.   On: 03/31/2021 22:16   DG Chest Port 1 View  Result Date: 04/01/2021 CLINICAL DATA:  75 year old female with history of seizure like activity. EXAM: PORTABLE CHEST 1 VIEW COMPARISON:  Chest x-ray 12/23/2020. FINDINGS: Lung volumes are normal. Patchy ill-defined opacities and areas of interstitial prominence are noted in the lungs bilaterally, most evident in the mid to lower lungs. No confluent consolidative airspace disease. No pleural effusions. No pneumothorax. No evidence of pulmonary edema. Heart size is mildly enlarged. Upper mediastinal contours are within normal limits. Small left trunk device projecting over the medial left hemithorax, similar to the prior study. IMPRESSION: 1. The appearance of the lungs is concerning for potential multilobar bilateral pneumonia. Clinical correlation for signs and symptoms of viral infection is suggested. 2. Mild cardiomegaly. Electronically Signed   By: Vinnie Langton M.D.   On: 04/01/2021 05:42      Subjective:  She denies any complaints today, reports she tolerated her lunch very well. Discharge Exam: Vitals:   04/03/21 1111 04/03/21 1142  BP: (!) 152/81 (!) 142/65  Pulse: 68 67  Resp: (!) 29 18  Temp:  97.6 F (36.4 C)  SpO2: 98% 97%   Vitals:   04/03/21 1003 04/03/21 1019 04/03/21 1111 04/03/21 1142  BP: 134/68 (!) 145/61 (!) 152/81 (!) 142/65  Pulse: 60 62 68 67  Resp: 19 19 (!) 29 18  Temp: 97.7 F (36.5 C) 98.1 F (36.7 C)   97.6 F (36.4 C)  TempSrc:    Oral  SpO2: 95% 99% 98% 97%  Weight:      Height:        General: Pt  is alert, awake, not in acute distress Cardiovascular: RRR, S1/S2 +, no rubs, no gallops Respiratory: CTA bilaterally, no wheezing, no rhonchi Abdominal: Soft, NT, ND, bowel sounds + Extremities: no edema, no cyanosis    The results of significant diagnostics from this hospitalization (including imaging, microbiology, ancillary and laboratory) are listed below for reference.     Microbiology: Recent Results (from the past 240 hour(s))  Resp Panel by RT-PCR (Flu A&B, Covid) Nasopharyngeal Swab     Status: None   Collection Time: 03/31/21 11:13 PM   Specimen: Nasopharyngeal Swab; Nasopharyngeal(NP) swabs in vial transport medium  Result Value Ref Range Status   SARS Coronavirus 2 by RT PCR NEGATIVE NEGATIVE Final    Comment: (NOTE) SARS-CoV-2 target nucleic acids are NOT DETECTED.  The SARS-CoV-2 RNA is generally detectable in upper respiratory specimens during the acute phase of infection. The lowest concentration of SARS-CoV-2 viral copies this assay can detect is 138 copies/mL. A negative result does not preclude SARS-Cov-2 infection and should not be used as the sole basis for treatment or other patient management decisions. A negative result may occur with  improper specimen collection/handling, submission of specimen other than nasopharyngeal swab, presence of viral mutation(s) within the areas targeted by this assay, and inadequate number of viral copies(<138 copies/mL). A negative result must be combined with clinical observations, patient history, and epidemiological information. The expected result is Negative.  Fact Sheet for Patients:  EntrepreneurPulse.com.au  Fact Sheet for Healthcare Providers:  IncredibleEmployment.be  This test is no t yet approved or cleared by the [place] FDA and  has been authorized for detection  and/or diagnosis of SARS-CoV-2 by FDA under an Emergency Use Authorization (EUA). This EUA will remain  in effect (meaning this test can be used) for the duration of the COVID-19 declaration under Section 564(b)(1) of the Act, 21 U.S.C.section 360bbb-3(b)(1), unless the authorization is terminated  or revoked sooner.       Influenza A by PCR NEGATIVE NEGATIVE Final   Influenza B by PCR NEGATIVE NEGATIVE Final    Comment: (NOTE) The Xpert Xpress SARS-CoV-2/FLU/RSV plus assay is intended as an aid in the diagnosis of influenza from Nasopharyngeal swab specimens and should not be used as a sole basis for treatment. Nasal washings and aspirates are unacceptable for Xpert Xpress SARS-CoV-2/FLU/RSV testing.  Fact Sheet for Patients: EntrepreneurPulse.com.au  Fact Sheet for Healthcare Providers: IncredibleEmployment.be  This test is not yet approved or cleared by the [place] FDA and has been authorized for detection and/or diagnosis of SARS-CoV-2 by FDA under an Emergency Use Authorization (EUA). This EUA will remain in effect (meaning this test can be used) for the duration of the COVID-19 declaration under Section 564(b)(1) of the Act, 21 U.S.C. section 360bbb-3(b)(1), unless the authorization is terminated or revoked.  Performed at [facility], [place] [address], [place], [place] [address]      Labs: BNP (last 3 results) Recent Labs    11/13/20 2307  BNP 678.9*   Basic Metabolic Panel: Recent Labs  Lab 03/27/21 1536 03/31/21 2044 04/01/21 0855 04/02/21 0139 04/03/21 0016  NA 137 133* 138 136 133*  K 3.9 3.3* 3.8 3.2* 3.9  CL 96 97* 103 102 101  CO2 _0 GLUCOSE 85 185* 84 107* 110*  BUN _1 6* <5*  CREATININE 0.82 0.88 0.67 0.70 0.68  CALCIUM 9.6 8.6* 8.8* 8.8* 8.8*  MG  --   --  2.0  --   --  Liver Function Tests: Recent Labs  Lab 03/31/21 2044 04/01/21 0855  AST 20 17  ALT 14 14  ALKPHOS  102 88  BILITOT 0.2* 1.5*  PROT 6.6 6.6  ALBUMIN 3.2* 3.1*   No results for input(s): LIPASE, AMYLASE in the last 168 hours. No results for input(s): AMMONIA in the last 168 hours. CBC: Recent Labs  Lab 03/31/21 2044 04/01/21 0855 04/01/21 1600 04/02/21 0139 04/03/21 0016  WBC 5.3 4.2 4.9 5.2 6.2  NEUTROABS 2.8  --   --   --   --   HGB 5.1* 9.4* 9.3* 9.6* 9.7*  HCT 16.6* 29.0* 29.5* 29.8* 30.6*  MCV 76.5* 76.9* 77.2* 75.6* 77.9*  PLT 262 252 258 244 259   Cardiac Enzymes: No results for input(s): CKTOTAL, CKMB, CKMBINDEX, TROPONINI in the last 168 hours. BNP: Invalid input(s): POCBNP CBG: Recent Labs  Lab 04/02/21 1628 04/02/21 2046 04/03/21 0740 04/03/21 1002 04/03/21 1141  GLUCAP 100* 125* 126* 92 83   D-Dimer No results for input(s): DDIMER in the last 72 hours. Hgb A1c No results for input(s): HGBA1C in the last 72 hours. Lipid Profile No results for input(s): CHOL, HDL, LDLCALC, TRIG, CHOLHDL, LDLDIRECT in the last 72 hours. Thyroid function studies No results for input(s): TSH, T4TOTAL, T3FREE, THYROIDAB in the last 72 hours.  Invalid input(s): FREET3 Anemia work up No results for input(s): VITAMINB12, FOLATE, FERRITIN, TIBC, IRON, RETICCTPCT in the last 72 hours. Urinalysis    Component Value Date/Time   COLORURINE STRAW (A) 03/31/2021 2300   APPEARANCEUR CLEAR 03/31/2021 2300   LABSPEC 1.004 (L) 03/31/2021 2300   PHURINE 5.0 03/31/2021 2300   GLUCOSEU NEGATIVE 03/31/2021 2300   GLUCOSEU NEGATIVE 04/17/2016 0955   HGBUR NEGATIVE 03/31/2021 2300   BILIRUBINUR NEGATIVE 03/31/2021 2300   BILIRUBINUR Negative 12/06/2020 1701   KETONESUR NEGATIVE 03/31/2021 2300   PROTEINUR NEGATIVE 03/31/2021 2300   UROBILINOGEN 0.2 12/06/2020 1701   UROBILINOGEN 0.2 04/17/2016 0955   NITRITE NEGATIVE 03/31/2021 2300   LEUKOCYTESUR NEGATIVE 03/31/2021 2300   Procedure: Colonoscopy Indications: Last colonoscopy: 2018, Hematochezia, Acute post hemorrhagic  anemia Providers: Clarene Essex, MD, Burtis Junes, RN, Benetta Spar, Technician Referring MD: Medicines: Propofol total dose 340 mg IV, 60 mg IV lidocaine Complications: No immediate complications. Estimated Blood Loss: Estimated blood loss: none. Pre-Anesthesia Assessment: - Prior to the procedure, a History and Physical was performed, and patient medications and allergies were reviewed. The patient's tolerance of previous anesthesia was also reviewed. The risks and benefits of the procedure and the sedation options and risks were discussed with the patient. All questions were answered, and informed consent was obtained. Prior Anticoagulants: The patient has taken no previous anticoagulant or antiplatelet agents. ASA Grade Assessment: III - A patient with severe systemic disease. After reviewing the risks and benefits, the patient was deemed in satisfactory condition to undergo the procedure. After obtaining informed consent, the colonoscope was passed under direct vision. Throughout the procedure, the patient's blood pressure, pulse, and oxygen saturations were monitored continuously. The PCF-190TL ([id number]) Olympus colonoscope was introduced through the anus and advanced to the the terminal ileum. The terminal ileum, ileocecal valve, appendiceal orifice, and rectum were photographed. The colonoscopy was performed without difficulty. The patient tolerated the procedure well. The quality of the bowel preparation was adequate to identify polyps 6 mm and larger in size. Procedure: Scope In: 9:31:02 AM Scope Out: 9:55:36 AM Scope Withdrawal Time: 16 Minutes 45 Seconds Total Procedure Duration: 24 Minutes 34 Seconds External and internal hemorrhoids were  found during retroflexion, during perianal exam and during digital exam. The hemorrhoids were small. Findings: Multiple small and large-mouthed diverticula were found in the sigmoid colon, descending colon, transverse colon and ascending  colon. Two semi-sessile polyps were found in the proximal descending colon and mid descending colon. The polyps were small in size. These polyps were removed with a cold snare. Resection and retrieval were complete. [facility] Powered by [employer] MD Page 1 of [facility] Patient Name: Aileen Amore Procedure Date : 04/03/2021 MRN: [id number] Attending MD: Clarene Essex, MD Date of Birth: [birth year] CSN: [id number] Age: 55 Admit Type: Inpatient The terminal ileum appeared normal. The exam was otherwise without abnormality. - External and internal hemorrhoids. - Diverticulosis in the sigmoid colon, in the descending colon, in the transverse colon and in the ascending colon. - Two small polyps in the proximal descending colon and in the mid descending colon, removed with a cold snare. Resected and retrieved. - The examined portion of the ileum was normal. - The examination was otherwise normal. Impression: - Patient has a contact number available for emergencies. The signs and symptoms of potential delayed complications were discussed with the patient. Return to normal activities tomorrow. Written discharge instructions were provided to the patient. - Soft diet today. - Continue present medications. Please call us with this hospital stay if we could be of any further assistance from a GI standpoint - Await pathology results. - Repeat colonoscopy in 5 years for surveillance based on pathology results. - Return to GI office in 1 month. With Dr. Paulita Fujita her primary gastroenterology - Telephone GI clinic for pathology results in 1 week. - Telephone GI clinic if symptomatic PRN. Recommendation: --- Professional --- [phone], Colonoscopy, flexible; with removal of tumor(s), polyp(s), or other lesion(s) by snare technique Procedure Code(s): --- Professional --- K63.5, Polyp of colon K92.1, Melena (includes Hematochezia) D62, Acute posthemorrhagic  anemia K57.30, Diverticulosis of large intestine without perforation or abscess without bleeding Diagnosis Code(s): CPT  2019  American Medical Association. All rights reserved.  [facility] Invalid input(s): PROCALCITONIN,  WBC,  LACTICIDVEN Microbiology Recent Results (from the past 240 hour(s))  Resp Panel by RT-PCR (Flu A&B, Covid) Nasopharyngeal Swab     Status: None   Collection Time: 03/31/21 11:13 PM   Specimen: Nasopharyngeal Swab; Nasopharyngeal(NP) swabs in vial transport medium  Result Value Ref Range Status   SARS Coronavirus 2 by RT PCR NEGATIVE NEGATIVE Final    Comment: (NOTE) SARS-CoV-2 target nucleic acids are NOT DETECTED.  The SARS-CoV-2 RNA is generally detectable in upper respiratory specimens during the acute phase of infection. The lowest concentration of SARS-CoV-2 viral copies this assay can detect is 138 copies/mL. A negative result does not preclude SARS-Cov-2 infection and should not be used as the sole basis for treatment or other patient management decisions. A negative result may occur with  improper specimen collection/handling, submission of specimen other than nasopharyngeal swab, presence of viral mutation(s) within the areas targeted by this assay, and inadequate number of viral copies(<138 copies/mL). A negative result must be combined with clinical observations, patient history, and epidemiological information. The expected result is Negative.  Fact Sheet for Patients:  EntrepreneurPulse.com.au  Fact Sheet for Healthcare Providers:  IncredibleEmployment.be  This test is no t yet approved or cleared by the [place] FDA and  has been authorized for detection and/or diagnosis of SARS-CoV-2 by FDA under an Emergency Use Authorization (EUA). This EUA will remain  in effect (meaning this  test can be used) for the duration of the COVID-19 declaration under Section 564(b)(1) of the Act,  21 U.S.C.section 360bbb-3(b)(1), unless the authorization is terminated  or revoked sooner.       Influenza A by PCR NEGATIVE NEGATIVE Final   Influenza B by PCR NEGATIVE NEGATIVE Final    Comment: (NOTE) The Xpert Xpress SARS-CoV-2/FLU/RSV plus assay is intended as an aid in the diagnosis of influenza from Nasopharyngeal swab specimens and should not be used as a sole basis for treatment. Nasal washings and aspirates are unacceptable for Xpert Xpress SARS-CoV-2/FLU/RSV testing.  Fact Sheet for Patients: EntrepreneurPulse.com.au  Fact Sheet for Healthcare Providers: IncredibleEmployment.be  This test is not yet approved or cleared by the [place] FDA and has been authorized for detection and/or diagnosis of SARS-CoV-2 by FDA under an Emergency Use Authorization (EUA). This EUA will remain in effect (meaning this test can be used) for the duration of the COVID-19 declaration under Section 564(b)(1) of the Act, 21 U.S.C. section 360bbb-3(b)(1), unless the authorization is terminated or revoked.  Performed at [facility], [place] [address] [place], [place] [address]      Time coordinating discharge: Over 30 minutes  SIGNED:   Phillips Climes, MD  [facility] 04/03/2021, 1:39 PM Pager   If 7PM-7AM, please contact night-coverage [email or web address] Password [email or web address]

## 2021-04-03 NOTE — Progress Notes (Signed)
Pt transported off unit for procedure, pt alert and speaking with staff, no concerns noted.

## 2021-04-03 NOTE — Anesthesia Preprocedure Evaluation (Signed)
Anesthesia Evaluation  Patient identified by MRN, date of birth, ID band Patient awake    Reviewed: Allergy & Precautions, H&P , NPO status , Patient's Chart, lab work & pertinent test results  Airway Mallampati: II   Neck ROM: full    Dental   Pulmonary shortness of breath, sleep apnea , COPD, former smoker,    breath sounds clear to auscultation       Cardiovascular hypertension, + CAD, + Past MI and +CHF   Rhythm:regular Rate:Normal     Neuro/Psych  Headaches, Seizures -,  PSYCHIATRIC DISORDERS Depression    GI/Hepatic GERD  ,  Endo/Other  diabetes  Renal/GU      Musculoskeletal  (+) Arthritis ,   Abdominal   Peds  Hematology  (+) Blood dyscrasia, anemia ,   Anesthesia Other Findings   Reproductive/Obstetrics                             Anesthesia Physical Anesthesia Plan  ASA: 3  Anesthesia Plan: MAC   Post-op Pain Management:    Induction: Intravenous  PONV Risk Score and Plan: 2 and Ondansetron, Propofol infusion and Treatment may vary due to age or medical condition  Airway Management Planned: Simple Face Mask  Additional Equipment:   Intra-op Plan:   Post-operative Plan: Extubation in OR  Informed Consent: I have reviewed the patients History and Physical, chart, labs and discussed the procedure including the risks, benefits and alternatives for the proposed anesthesia with the patient or authorized representative who has indicated his/her understanding and acceptance.     Dental advisory given  Plan Discussed with: CRNA, Anesthesiologist and Surgeon  Anesthesia Plan Comments:         Anesthesia Quick Evaluation

## 2021-04-03 NOTE — Progress Notes (Signed)
[name] 8:26 AM  Subjective: Patient seen and examined in hospital computer chart reviewed and case discussed with Dr. Therisa Doyne and she is doing much better without signs of bleeding or abdominal pain and no new complaints  Objective: Vital signs stable afebrile no acute distress exam please see preassessment evaluation hemoglobin stable BUN okay  Assessment: Guaiac positive anemia  Plan: Okay to proceed with colonoscopy with anesthesia assistance  [facility]  office [phone] After 5PM or if no answer call [phone]

## 2021-04-03 NOTE — Progress Notes (Signed)
Discharge education and packet provided to patient at bedside, all questions and concerns addressed, pt awaiting family to transport her home, no concerns noted.

## 2021-04-03 NOTE — Transfer of Care (Signed)
Immediate Anesthesia Transfer of Care Note  Patient: [name]  Procedure(s) Performed: COLONOSCOPY WITH PROPOFOL POLYPECTOMY  Patient Location: PACU  Anesthesia Type:MAC  Level of Consciousness: awake, alert  and oriented  Airway & Oxygen Therapy: Patient Spontanous Breathing and Patient connected to nasal cannula oxygen  Post-op Assessment: Report given to RN and Post -op Vital signs reviewed and stable  Post vital signs: Reviewed and stable  Last Vitals:  Vitals Value Taken Time  BP 134/68 04/03/21 1003  Temp 36.5 C 04/03/21 1003  Pulse 67 04/03/21 1007  Resp 18 04/03/21 1007  SpO2 99 % 04/03/21 1007  Vitals shown include unvalidated device data.  Last Pain:  Vitals:   04/03/21 1003  TempSrc:   PainSc: 0-No pain         Complications: No notable events documented.

## 2021-04-03 NOTE — Anesthesia Procedure Notes (Signed)
Procedure Name: MAC Date/Time: 04/03/2021 9:20 AM Performed by: Trinna Post., CRNA Pre-anesthesia Checklist: Patient identified, Emergency Drugs available, Suction available, Patient being monitored and Timeout performed Patient Re-evaluated:Patient Re-evaluated prior to induction Oxygen Delivery Method: Nasal cannula Preoxygenation: Pre-oxygenation with 100% oxygen Induction Type: IV induction Placement Confirmation: positive ETCO2

## 2021-04-03 NOTE — Discharge Instructions (Signed)
Follow with Primary MD [name], MD in 7 days   Get CBC, CMP,  checked  by Primary MD next visit.    Activity: As tolerated with Full fall precautions use walker/cane & assistance as needed   Disposition Home    Diet: Heart Healthy/carb modified   On your next visit with your primary care physician please Get Medicines reviewed and adjusted.   Please request your Prim.MD to go over all Hospital Tests and Procedure/Radiological results at the follow up, please get all Hospital records sent to your Prim MD by signing hospital release before you go home.   If you experience worsening of your admission symptoms, develop shortness of breath, life threatening emergency, suicidal or homicidal thoughts you must seek medical attention immediately by calling 911 or calling your MD immediately  if symptoms less severe.  You Must read complete instructions/literature along with all the possible adverse reactions/side effects for all the Medicines you take and that have been prescribed to you. Take any new Medicines after you have completely understood and accpet all the possible adverse reactions/side effects.   Do not drive, operating heavy machinery, perform activities at heights, swimming or participation in water activities or provide baby sitting services if your were admitted for syncope or siezures until you have seen by Primary MD or a Neurologist and advised to do so again.  Do not drive when taking Pain medications.    Do not take more than prescribed Pain, Sleep and Anxiety Medications  Special Instructions: If you have smoked or chewed Tobacco  in the last 2 yrs please stop smoking, stop any regular Alcohol  and or any Recreational drug use.  Wear Seat belts while driving.   Please note  You were cared for by a hospitalist during your hospital stay. If you have any questions about your discharge medications or the care you received while you were in the hospital after you  are discharged, you can call the unit and asked to speak with the hospitalist on call if the hospitalist that took care of you is not available. Once you are discharged, your primary care physician will handle any further medical issues. Please note that NO REFILLS for any discharge medications will be authorized once you are discharged, as it is imperative that you return to your primary care physician (or establish a relationship with a primary care physician if you do not have one) for your aftercare needs so that they can reassess your need for medications and monitor your lab values.

## 2021-04-03 NOTE — Progress Notes (Signed)
Pt sitting on side of bed, denies pain, reports having small pellet dark colored stool that looks like it has blood in it, pt aware of procedure this am, pt has been npo since midnight, consent noted in chart, awaiting transportation to endo., no concerns noted at this time.

## 2021-04-03 NOTE — Progress Notes (Signed)
Pt returned to unit, alert and oriented x4, reports feeling a bit loopy still, denies pain, am medications provided, tele reconnected, no concerns noted at this time.

## 2021-04-03 NOTE — Telephone Encounter (Signed)
Transition Care Management Unsuccessful Follow-up Telephone Call  Date of discharge and from where:  04/03/2021  Attempts:  1st Attempt  Reason for unsuccessful TCM follow-up call:  Left voice message

## 2021-04-03 NOTE — Op Note (Signed)
[facility] Patient Name: [name] Procedure Date : 04/03/2021 MRN: [id number] Attending MD: Clarene Essex , MD Date of Birth: February 22, 1946 CSN: [id number] Age: 75 Admit Type: Inpatient Procedure:                Colonoscopy Indications:              Last colonoscopy: 2018, Hematochezia, Acute post                            hemorrhagic anemia Providers:                Clarene Essex, MD, Burtis Junes, RN, Benetta Spar,                            Technician Referring MD:              Medicines:                Propofol total dose 340 mg IV, 60 mg IV lidocaine Complications:            No immediate complications. Estimated Blood Loss:     Estimated blood loss: none. Procedure:                Pre-Anesthesia Assessment:                           - Prior to the procedure, a History and Physical                            was performed, and patient medications and                            allergies were reviewed. The patient's tolerance of                            previous anesthesia was also reviewed. The risks                            and benefits of the procedure and the sedation                            options and risks were discussed with the patient.                            All questions were answered, and informed consent                            was obtained. Prior Anticoagulants: The patient has                            taken no previous anticoagulant or antiplatelet                            agents. ASA Grade Assessment: III - A patient with  severe systemic disease. After reviewing the risks                            and benefits, the patient was deemed in                            satisfactory condition to undergo the procedure.                           After obtaining informed consent, the colonoscope                            was passed under direct vision. Throughout the                            procedure, the  patient's blood pressure, pulse, and                            oxygen saturations were monitored continuously. The                            PCF-190TL ([id number]) Olympus colonoscope was                            introduced through the anus and advanced to the the                            terminal ileum. The terminal ileum, ileocecal                            valve, appendiceal orifice, and rectum were                            photographed. The colonoscopy was performed without                            difficulty. The patient tolerated the procedure                            well. The quality of the bowel preparation was                            adequate to identify polyps 6 mm and larger in size. Scope In: 9:31:02 AM Scope Out: 9:55:36 AM Scope Withdrawal Time: 0 hours 16 minutes 45 seconds  Total Procedure Duration: 0 hours 24 minutes 34 seconds  Findings:      External and internal hemorrhoids were found during retroflexion, during       perianal exam and during digital exam. The hemorrhoids were small.      Multiple small and large-mouthed diverticula were found in the sigmoid       colon, descending colon, transverse colon and ascending colon.      Two semi-sessile polyps were found in the proximal descending colon and       mid descending colon. The polyps were small in size. These  polyps were       removed with a cold snare. Resection and retrieval were complete.      The terminal ileum appeared normal.      The exam was otherwise without abnormality. Impression:               - External and internal hemorrhoids.                           - Diverticulosis in the sigmoid colon, in the                            descending colon, in the transverse colon and in                            the ascending colon.                           - Two small polyps in the proximal descending colon                            and in the mid descending colon, removed with a                             cold snare. Resected and retrieved.                           - The examined portion of the ileum was normal.                           - The examination was otherwise normal. Recommendation:           - Patient has a contact number available for                            emergencies. The signs and symptoms of potential                            delayed complications were discussed with the                            patient. Return to normal activities tomorrow.                            Written discharge instructions were provided to the                            patient.                           - Soft diet today.                           - Continue present medications. Please call us with  this hospital stay if we could be of any further                            assistance from a GI standpoint                           - Await pathology results.                           - Repeat colonoscopy in 5 years for surveillance                            based on pathology results.                           - Return to GI office in 1 month. With Dr. Paulita Fujita                            her primary gastroenterology                           - Telephone GI clinic for pathology results in 1                            week.                           - Telephone GI clinic if symptomatic PRN. Procedure Code(s):        --- Professional ---                           [phone], Colonoscopy, flexible; with removal of                            tumor(s), polyp(s), or other lesion(s) by snare                            technique Diagnosis Code(s):        --- Professional ---                           K63.5, Polyp of colon                           K92.1, Melena (includes Hematochezia)                           D62, Acute posthemorrhagic anemia                           K57.30, Diverticulosis of large intestine without                            perforation or  abscess without bleeding CPT copyright 2019 American Medical Association. All rights reserved. The codes documented in this report are preliminary and upon coder review may  be revised to meet  current compliance requirements. Clarene Essex, MD 04/03/2021 10:13:01 AM This report has been signed electronically. Number of Addenda: 0

## 2021-04-04 ENCOUNTER — Telehealth: Payer: Self-pay

## 2021-04-04 ENCOUNTER — Encounter (HOSPITAL_COMMUNITY): Payer: Self-pay | Admitting: Gastroenterology

## 2021-04-04 LAB — SURGICAL PATHOLOGY

## 2021-04-04 NOTE — Telephone Encounter (Signed)
Transition Care Management Unsuccessful Follow-up Telephone Call  Date of discharge and from where:  04/03/2021   Attempts:  2nd Attempt  Reason for unsuccessful TCM follow-up call:  Left voice message

## 2021-04-04 NOTE — Anesthesia Postprocedure Evaluation (Signed)
Anesthesia Post Note  Patient: [name]  Procedure(s) Performed: COLONOSCOPY WITH PROPOFOL POLYPECTOMY     Patient location during evaluation: PACU Anesthesia Type: MAC Level of consciousness: awake and alert Pain management: pain level controlled Vital Signs Assessment: post-procedure vital signs reviewed and stable Respiratory status: spontaneous breathing, nonlabored ventilation, respiratory function stable and patient connected to nasal cannula oxygen Cardiovascular status: stable and blood pressure returned to baseline Postop Assessment: no apparent nausea or vomiting Anesthetic complications: no   No notable events documented.  Last Vitals:  Vitals:   04/03/21 1111 04/03/21 1142  BP: (!) 152/81 (!) 142/65  Pulse: 68 67  Resp: (!) 29 18  Temp:  36.4 C  SpO2: 98% 97%    Last Pain:  Vitals:   04/03/21 1142  TempSrc: Oral  PainSc:                  Chaun Uemura S

## 2021-04-07 ENCOUNTER — Other Ambulatory Visit: Payer: Self-pay | Admitting: Internal Medicine

## 2021-04-10 ENCOUNTER — Ambulatory Visit (INDEPENDENT_AMBULATORY_CARE_PROVIDER_SITE_OTHER): Payer: Medicare HMO | Admitting: Internal Medicine

## 2021-04-10 ENCOUNTER — Encounter: Payer: Self-pay | Admitting: Internal Medicine

## 2021-04-10 ENCOUNTER — Other Ambulatory Visit: Payer: Self-pay

## 2021-04-10 VITALS — BP 124/60 | HR 62 | Temp 98.3°F | Ht 61.0 in | Wt 192.0 lb

## 2021-04-10 DIAGNOSIS — E876 Hypokalemia: Secondary | ICD-10-CM | POA: Diagnosis not present

## 2021-04-10 DIAGNOSIS — D62 Acute posthemorrhagic anemia: Secondary | ICD-10-CM

## 2021-04-10 DIAGNOSIS — R55 Syncope and collapse: Secondary | ICD-10-CM | POA: Diagnosis not present

## 2021-04-10 DIAGNOSIS — Z6836 Body mass index (BMI) 36.0-36.9, adult: Secondary | ICD-10-CM

## 2021-04-10 DIAGNOSIS — Z09 Encounter for follow-up examination after completed treatment for conditions other than malignant neoplasm: Secondary | ICD-10-CM

## 2021-04-10 DIAGNOSIS — K5790 Diverticulosis of intestine, part unspecified, without perforation or abscess without bleeding: Secondary | ICD-10-CM | POA: Diagnosis not present

## 2021-04-10 DIAGNOSIS — D649 Anemia, unspecified: Secondary | ICD-10-CM | POA: Diagnosis not present

## 2021-04-10 NOTE — Progress Notes (Signed)
I,Katawbba Wiggins,acting as a Education administrator for [name], MD.,have documented all relevant documentation on the behalf of [name], MD,as directed by  [name], MD while in the presence of [name], MD.  This visit occurred during the SARS-CoV-2 public health emergency.  Safety protocols were in place, including screening questions prior to the visit, additional usage of staff PPE, and extensive cleaning of exam room while observing appropriate contact time as indicated for disinfecting solutions.  Subjective:     Patient ID: [name] , female    DOB: [birth year] , 75 y.o.   MRN: [id number]   Chief Complaint  Patient presents with   hospital f/u    HPI  Admission: 03/31/21 Discharge: 04/03/21  The patient is here today for a hospital follow-up.  She was admitted to [facility] on 03/31/2021 with acute lower gastrointestinal bleed.  She presented from home to [facility] complaining of loss of consciousness.  Work-up significant for lower GI bleed with hemoglobin of 5.1, she was admitted for further work-up.  It is felt that the anemia is due to diverticular bleed. Pt also has h/o tubular adenoma. She received 2 units packed RBCs without complication. Her hemoglobin remained stable post transfusion.   GI evaluated the patient: input greatly appreciated, colonoscopy performed 04/03/2021, which was significant for diverticulosis in the sigmoid, descending and transverse colon, as well for 2 small polyps in the proximal descending and in the mid descending colon removed with cold snares.  She has not had any further episodes of bleeding since discharge.   OF note: amlodipine was d/c'd due to low BP readings, she states her BP has been controlled at home.        Past Medical History:  Diagnosis Date   Abnormal liver function     in the past.   Anemia    Arthritis    all over   Bruises easily    Cataract    right eye;immature   CHF (congestive heart  failure) (HCC)    Chronic back pain    stenosis   Chronic cough    Chronic kidney disease    COPD (chronic obstructive pulmonary disease) ([place])    COVID    aug/sept 2021   Demand myocardial infarction [facility]) 2012   Demand Infarction in setting of Pancreatitis --> mild Troponin elevation, NON-OBSTRUCTIVE CAD   Depression    takes Abilify daily as well as Zoloft   Diastolic heart failure 3151   Grade 1 diastolic Dysfunction by Echo    Diverticulosis    DM (diabetes mellitus) ([place])    takes Victoza daily as well as Lantus and Humalog   Empty sella ([place])    on MRI in 2009.   Glaucoma    Headache(784.0)    last migraine-4-62yr ago   History of blood transfusion    no abnormal reaction noted   History of colon polyps    benign   HTN (hypertension)    takes Benicar,Imdur,and Bystolic daily   Hyperlipidemia    takes Lipitor daily   Joint swelling    Nocturia    Ob[place]ov[place]ilation syndrome [place]    Obstructive sleep apnea 02/2018   Notably improved split-night study with weight loss from 270 (2017) down to 250 pounds (2019).   Pancreatitis    takes Pancrelipase daily   Parkinson's disease ([place]    takes Sinemet daily   Peripheral neuropathy    Pneumonia 2012   [place]  takes Lamictal daily and Primidone nightly;last seizure 2wks ago   Urinary urgency    With increased frequency   Varicose veins of both lower extremities with pain    With edema.  Takes daily Lasix     Family History  Problem Relation Age of Onset   Allergies Father    Heart disease Father        before 2   Heart failure Father    Hypertension Father    Hyperlipidemia Father    Heart disease Mother    Diabetes Mother    Hypertension Mother    Hyperlipidemia Mother    Hypertension Sister    Heart disease Sister        before 73   Hyperlipidemia Sister    Diabetes Brother    Hypertension Brother    Diabetes Sister    Hypertension Sister    Diabetes Son    Hypertension Son     Cancer Other      Current Outpatient Medications:    acetaminophen (TYLENOL) 500 MG tablet, Take 500-1,000 mg by mouth every 4 (four) hours as needed for moderate pain. , Disp: , Rfl:    Alcohol Swabs (B-D SINGLE USE SWABS REGULAR) PADS, USE TWICE DAILY AS DIRECTED, Disp: 200 each, Rfl: 6   Ascorbic Acid (VITAMIN C) 1000 MG tablet, Take 1,000 mg by mouth daily., Disp: , Rfl:    atorvastatin (LIPITOR) 10 MG tablet, TAKE 1 TABLET EVERY EVENING., Disp: 90 tablet, Rfl: 2   Blood Glucose Calibration (TRUE METRIX LEVEL 1) Low SOLN, Use as directed  Dx:e11.22, Disp: 1 each, Rfl: 2   dexlansoprazole (DEXILANT) 60 MG capsule, Take 1 capsule (60 mg total) by mouth daily., Disp: 90 capsule, Rfl: 2   diclofenac sodium (VOLTAREN) 1 % GEL, Apply 2 g topically 4 (four) times daily as needed (pain)., Disp: , Rfl:    ferrous sulfate 324 MG TBEC, Take 324 mg by mouth 2 (two) times daily., Disp: , Rfl:    Glucagon (GVOKE HYPOPEN 1-PACK) 0.5 MG/0.1ML SOAJ, Inject 0.5 mg into the skin daily as needed., Disp: 0.1 mL, Rfl: 11   glucose blood (TRUE METRIX BLOOD GLUCOSE TEST) test strip, USE AS DIRECTED TO CHECK BLOOD SUGARS 2 TIMES PER DAY, Disp: 200 strip, Rfl: 2   hydrocortisone cream 1 %, Apply 1 application topically daily as needed for itching. , Disp: , Rfl:    insulin lispro (HUMALOG KWIKPEN) 100 UNIT/ML KwikPen, Inject 0.02-0.04 mLs (2-4 Units total) into the skin 3 (three) times daily as needed (high blood sugar over 150)., Disp: 15 mL, Rfl: 2   Isopropyl Alcohol (ALCOHOL WIPES) 70 % MISC, Apply 1 each topically 2 (two) times daily., Disp: 300 each, Rfl: 3   isosorbide mononitrate (IMDUR) 30 MG 24 hr tablet, TAKE 1 TABLET EVERY DAY (APPOINTMENT IS NEEDED FOR REFILLS), Disp: 90 tablet, Rfl: 2   lamoTRIgine (LAMICTAL) 150 MG tablet, Take 1 tablet (150 mg total) by mouth 2 (two) times daily., Disp: 180 tablet, Rfl: 3   ondansetron (ZOFRAN) 4 MG tablet, Take 4 mg by mouth every 8 (eight) hours as needed for nausea  or vomiting., Disp: , Rfl:    OZEMPIC, 1 MG/DOSE, 4 MG/3ML SOPN, INJECT 1 MG INTO THE SKIN ONCE A WEEK., Disp: 9 mL, Rfl: 3   Pancrelipase, Lip-Prot-Amyl, 25000-79000 units CPEP, Take 1-2 capsules by mouth See admin instructions. Take 1 capsules in the morning with breakfast, 1 capsule with lunch, and 2 capsules with supper, Disp: , Rfl:  potassium chloride SA (KLOR-CON) 20 MEQ tablet, TAKE 2 TABLETS EVERY DAY BUT WHEN TAKING METOLAZONE TAKE 3 TABLETS DAILY AS DIRECTED, Disp: 204 tablet, Rfl: 2   sennosides-docusate sodium (SENOKOT-S) 8.6-50 MG tablet, Take 1-2 tablets by mouth at bedtime as needed for constipation., Disp: , Rfl:    sertraline (ZOLOFT) 50 MG tablet, TAKE 1 TABLET EVERY DAY, Disp: 90 tablet, Rfl: 2   tiotropium (SPIRIVA HANDIHALER) 18 MCG inhalation capsule, Place 1 capsule (18 mcg total) into inhaler and inhale daily., Disp: 90 capsule, Rfl: 3   tolnaftate (TINACTIN) 1 % cream, Apply 1 application topically daily as needed (foot fungus). , Disp: , Rfl:    TRUEplus Lancets 33G MISC, Use as directed to check blood sugars 2 times per day dx: e11.22, Disp: 300 each, Rfl: 2   albuterol (VENTOLIN HFA) 108 (90 Base) MCG/ACT inhaler, Inhale 1-2 puffs into the lungs every 6 (six) hours as needed for wheezing or shortness of breath., Disp: 18 g, Rfl: 5   benzonatate (TESSALON PERLES) 100 MG capsule, Take 1 capsule (100 mg total) by mouth 3 (three) times daily as needed for cough., Disp: 30 capsule, Rfl: 1   Blood Glucose Monitoring Suppl (TRUE METRIX METER) w/Device KIT, USE AS DIRECTED, Disp: 1 kit, Rfl: 1   carvedilol (COREG) 12.5 MG tablet, Take 1 tablet (12.5 mg total) by mouth 2 (two) times daily., Disp: 180 tablet, Rfl: 3   furosemide (LASIX) 80 MG tablet, Take 80 mg by mouth daily., Disp: , Rfl:    guaiFENesin-dextromethorphan (ROBITUSSIN DM) 100-10 MG/5ML syrup, Take 10 mLs by mouth every 6 (six) hours as needed for cough., Disp: 118 mL, Rfl: 0   hydrALAZINE (APRESOLINE) 100 MG  tablet, Take 1 tablet (100 mg total) by mouth 3 (three) times daily., Disp: 270 tablet, Rfl: 3   Insulin Pen Needle (B-D ULTRAFINE III SHORT PEN) 31G X 8 MM MISC, USE DAILY WITH VICTOZA AND/OR NOVOLOG., Disp: 400 each, Rfl: 2   levETIRAcetam (KEPPRA) 500 MG tablet, Take 1,000 mg by mouth at bedtime., Disp: , Rfl:    TOUJEO SOLOSTAR 300 UNIT/ML Solostar Pen, INJECT 36 UNITS INTO THE SKIN AT BEDTIME., Disp: 13.5 mL, Rfl: 1   Allergies  Allergen Reactions   Other Anaphylaxis and Rash    Bleach   Penicillins Hives    Did it involve swelling of the face/tongue/throat, SOB, or low BP? No Did it involve sudden or severe rash/hives, skin peeling, or any reaction on the inside of your mouth or nose? Yes Did you need to seek medical attention at a hospital or doctor's office? Yes When did it last happen?      Childhood allergy  If all above answers are "NO", may proceed with cephalosporin use.     Sulfa Antibiotics Hives   Aspirin Other (See Comments)     On aspirin 81 mg - Rectal bleeding in dec 2018   Codeine     Headache and makes the patient feel "off"     Review of Systems  Constitutional: Negative.   Respiratory: Negative.    Cardiovascular: Negative.   Gastrointestinal: Negative.   Psychiatric/Behavioral: Negative.    All other systems reviewed and are negative.   Today's Vitals   04/10/21 1202  BP: 124/60  Pulse: 62  Temp: 98.3 F (36.8 C)  Weight: 192 lb (87.1 kg)  Height: _0  (1.549 m)  PainSc: 0-No pain   Body mass index is 36.28 kg/m.  Wt Readings from Last 3 Encounters:  06/08/21  193 lb 12.8 oz (87.9 kg)  05/04/21 192 lb 3.2 oz (87.2 kg)  04/10/21 192 lb (87.1 kg)    BP Readings from Last 3 Encounters:  06/08/21 138/64  05/04/21 140/80  04/10/21 124/60    Objective:  Physical Exam Vitals and nursing note reviewed.  Constitutional:      Appearance: Normal appearance. She is obese.  HENT:     Head: Normocephalic and atraumatic.     Nose:     Comments:  Masked     Mouth/Throat:     Comments: Masked  Cardiovascular:     Rate and Rhythm: Normal rate and regular rhythm.     Heart sounds: Normal heart sounds.  Pulmonary:     Effort: Pulmonary effort is normal.     Breath sounds: Normal breath sounds.  Musculoskeletal:     Cervical back: Normal range of motion.  Skin:    General: Skin is warm.  Neurological:     General: No focal deficit present.     Mental Status: She is alert.  Psychiatric:        Mood and Affect: Mood normal.        Behavior: Behavior normal.        Assessment And Plan:     1. Acute on chronic blood loss anemia TCM PERFORMED. A MEMBER OF THE CLINICAL TEAM SPOKE WITH THE PATIENT UPON DISCHARGE. DISCHARGE SUMMARY WAS REVIEWED IN FULL DETAIL DURING THE VISIT. MEDS RECONCILED AND COMPARED TO DISCHARGE MEDS. MEDICATION LIST WAS UPDATED AND REVIEWED WITH THE PATIENT. GREATER THAN 50% FACE TO FACE TIME WAS SPENT IN COUNSELING AND COORDINATION OF CARE. ALL QUESTIONS WERE ANSWERED TO THE SATISFACTION OF THE PATIENT. She has not had any rectal bleeding since discharge. She is encouraged to notify myself/GI immediately should her sx recur. I will check blood count today.   - CBC with Diff  2. Diverticulosis Colonoscopy performed 10/10. Results: significant for diverticulosis in the sigmoid, descending and transverse colon, as well for 2 small polyps.  3. Hypokalemia Comments: I will recheck potassium level today. I will replete as needed. She is encouraged to increase her intake of potassium rich foods.  - BMP8+eGFR  4. Vasovagal syncope She is encouraged to stay well hydrated and to avoid straining while on the toilet.  5. Class 2 severe obesity due to excess calories with serious comorbidity and body mass index (BMI) of 36.0 to 36.9 in adult [facility]) Comments: She is encouraged to strive for BMI less than 30 to decrease cardiac risk. Advised to aim for at least 150 minutes of exercise per week.  6. Hospital discharge  follow-up   Patient was given opportunity to ask questions. Patient verbalized understanding of the plan and was able to repeat key elements of the plan. All questions were answered to their satisfaction.   I, [name], MD, have reviewed all documentation for this visit. The documentation on 04/10/21 for the exam, diagnosis, procedures, and orders are all accurate and complete.   IF YOU HAVE BEEN REFERRED TO A SPECIALIST, IT MAY TAKE 1-2 WEEKS TO SCHEDULE/PROCESS THE REFERRAL. IF YOU HAVE NOT HEARD FROM US/SPECIALIST IN TWO WEEKS, PLEASE GIVE [place] A CALL AT [phone] X [phone].   THE PATIENT IS ENCOURAGED TO PRACTICE SOCIAL DISTANCING DUE TO THE COVID-19 PANDEMIC.

## 2021-04-11 LAB — CBC WITH DIFFERENTIAL/PLATELET
Basophils Absolute: 0 10*3/uL (ref 0.0–0.2)
Basos: 1 %
EOS (ABSOLUTE): 0.2 10*3/uL (ref 0.0–0.4)
Eos: 3 %
Hematocrit: 32.1 % — ABNORMAL LOW (ref 34.0–46.6)
Hemoglobin: 9.8 g/dL — ABNORMAL LOW (ref 11.1–15.9)
Immature Grans (Abs): 0 10*3/uL (ref 0.0–0.1)
Immature Granulocytes: 0 %
Lymphocytes Absolute: 1.5 10*3/uL (ref 0.7–3.1)
Lymphs: 29 %
MCH: 23.8 pg — ABNORMAL LOW (ref 26.6–33.0)
MCHC: 30.5 g/dL — ABNORMAL LOW (ref 31.5–35.7)
MCV: 78 fL — ABNORMAL LOW (ref 79–97)
Monocytes Absolute: 0.5 10*3/uL (ref 0.1–0.9)
Monocytes: 10 %
Neutrophils Absolute: 3.1 10*3/uL (ref 1.4–7.0)
Neutrophils: 57 %
Platelets: 352 10*3/uL (ref 150–450)
RBC: 4.12 x10E6/uL (ref 3.77–5.28)
RDW: 17.1 % — ABNORMAL HIGH (ref 11.7–15.4)
WBC: 5.3 10*3/uL (ref 3.4–10.8)

## 2021-04-11 LAB — BMP8+EGFR
BUN/Creatinine Ratio: 13 (ref 12–28)
BUN: 11 mg/dL (ref 8–27)
CO2: 29 mmol/L (ref 20–29)
Calcium: 9.2 mg/dL (ref 8.7–10.3)
Chloride: 98 mmol/L (ref 96–106)
Creatinine, Ser: 0.87 mg/dL (ref 0.57–1.00)
Glucose: 97 mg/dL (ref 70–99)
Potassium: 3.9 mmol/L (ref 3.5–5.2)
Sodium: 141 mmol/L (ref 134–144)
eGFR: 70 mL/min/{1.73_m2} (ref >59)

## 2021-04-14 ENCOUNTER — Other Ambulatory Visit (HOSPITAL_COMMUNITY): Payer: Self-pay | Admitting: Cardiology

## 2021-04-14 ENCOUNTER — Other Ambulatory Visit: Payer: Self-pay

## 2021-04-14 MED ORDER — AMLODIPINE BESYLATE 5 MG PO TABS: 5.0000 mg | ORAL_TABLET | Freq: Every day | ORAL | 3 refills | Status: DC

## 2021-04-14 MED ORDER — BD PEN NEEDLE SHORT U/F 31G X 8 MM MISC: 2 refills | Status: DC

## 2021-04-18 LAB — IRON AND TIBC
Iron Saturation: 12 % — ABNORMAL LOW (ref 15–55)
Iron: 36 ug/dL (ref 27–139)
Total Iron Binding Capacity: 293 ug/dL (ref 250–450)
UIBC: 257 ug/dL (ref 118–369)

## 2021-04-18 LAB — SPECIMEN STATUS REPORT

## 2021-04-18 LAB — FERRITIN: Ferritin: 44 ng/mL (ref 15–150)

## 2021-04-24 DIAGNOSIS — N182 Chronic kidney disease, stage 2 (mild): Secondary | ICD-10-CM | POA: Diagnosis not present

## 2021-04-24 DIAGNOSIS — Z794 Long term (current) use of insulin: Secondary | ICD-10-CM

## 2021-04-24 DIAGNOSIS — I5032 Chronic diastolic (congestive) heart failure: Secondary | ICD-10-CM

## 2021-04-24 DIAGNOSIS — E1122 Type 2 diabetes mellitus with diabetic chronic kidney disease: Secondary | ICD-10-CM | POA: Diagnosis not present

## 2021-04-24 DIAGNOSIS — I110 Hypertensive heart disease with heart failure: Secondary | ICD-10-CM | POA: Diagnosis not present

## 2021-04-25 ENCOUNTER — Telehealth: Payer: Self-pay

## 2021-04-25 NOTE — Chronic Care Management (AMB) (Signed)
    QUINLAN MCFALL was reminded to have all medications, supplements and any blood glucose and blood pressure readings available for review with [name], Pharm. D, at her telephone visit on 04-26-2021 at 2:00.   Questions: Have you had any recent office visit or specialist visit outside of [place]? Patient states no  Are there any concerns you would like to discuss during your office visit? Patient states no but she is confused on how she is suppose to take medications since being discharge.  Are you having any problems obtaining your medications? (Whether it pharmacy issues or cost) Patient states no  If patient has any PAP medications ask if they are having any problems getting their PAP medication or refill? No PAP medications.  Care Gaps: Shingrix overdue  Yearly foot exam overdue Covid booster overdue   Star Rating Drug: Atorvastatin 10 mg- Last filled 03-24-2021 90 DS [employer] Ozempic 1 mg- Last filled 03-14-2021 84 DS [employer]  Any gaps in medications fill history? No  [place] Pharmacist Assistant [phone]

## 2021-04-26 ENCOUNTER — Ambulatory Visit (INDEPENDENT_AMBULATORY_CARE_PROVIDER_SITE_OTHER): Payer: Medicare HMO

## 2021-04-26 DIAGNOSIS — R609 Edema, unspecified: Secondary | ICD-10-CM

## 2021-04-26 DIAGNOSIS — E1122 Type 2 diabetes mellitus with diabetic chronic kidney disease: Secondary | ICD-10-CM

## 2021-04-26 NOTE — Progress Notes (Signed)
Chronic Care Management Pharmacy Note  05/03/2021 Name:  [name] MRN:  [id number] DOB:  Dec 27, 1945  Summary: Patient reports that she had a low blood sugar on 04/24/2021. She is also concerned about about transportation issues. She is also confused about the medication changes that were done in the hospital.   Recommendations/Changes made from today's visit: Recommend patient use [facility] transportation services for her visits. Recommended patient have a follow up visit with the cardiologist.   Plan: Patient call [facility] transportation services for office visits through out [phone]. Patient to schedule a follow up visit with the cardiologist due to multiple changes at her last discharge.    Subjective: [name] is an 75 y.o. year old female who is a primary patient of [name], MD.  The CCM team was consulted for assistance with disease management and care coordination needs.    Engaged with patient by telephone for follow up visit in response to provider referral for pharmacy case management and/or care coordination services.   Consent to Services:  The patient was given information about Chronic Care Management services, agreed to services, and gave verbal consent prior to initiation of services.  Please see initial visit note for detailed documentation.   Patient Care Team: [name], MD as PCP - General (Internal Medicine) Leonie Man, MD as PCP - Cardiology (Cardiology) Conni Slipper, NP as Referring Physician (Nurse Practitioner) Arta Silence, MD as Attending Physician (Gastroenterology) Elayne Snare, MD as Attending Physician (Endocrinology) Matilde Bash, MD as Referring Physician (Neurology) Inocencio Homes, DPM as Consulting Physician (Podiatry)  Recent office visits: 04/10/2021 PCP OV  03/27/2021 PCP OV   Recent consult visits: 02/20/2021 Endo OV 02/13/2021 Opthalmology OV  02/06/2021 Retina specialist [facility] visits: 03/31/2021  Hospital Visit    Objective:  Lab Results  Component Value Date   CREATININE 0.87 04/10/2021   BUN 11 04/10/2021   GFR 86.68 05/15/2016   GFRNONAA >60 04/03/2021   GFRAA 75 06/06/2020   NA 141 04/10/2021   K 3.9 04/10/2021   CALCIUM 9.2 04/10/2021   CO2 29 04/10/2021   GLUCOSE 97 04/10/2021    Lab Results  Component Value Date/Time   HGBA1C 7.3 (H) 03/27/2021 03:36 PM   HGBA1C 8.4 (H) 01/09/2021 03:56 PM   FRUCTOSAMINE 290 (H) 05/15/2016 01:35 PM   FRUCTOSAMINE 282 07/01/2015 10:41 AM   GFR 86.68 05/15/2016 01:35 PM   GFR 82.17 04/17/2016 09:55 AM   MICROALBUR 10 06/06/2020 01:23 PM   MICROALBUR 10 01/06/2020 12:16 PM    Last diabetic Eye exam:  Lab Results  Component Value Date/Time   HMDIABEYEEXA No Retinopathy 02/13/2021 12:00 AM    Last diabetic Foot exam: No results found for: HMDIABFOOTEX   Lab Results  Component Value Date   CHOL 144 06/20/2020   HDL 63 06/20/2020   LDLCALC 70 06/20/2020   LDLDIRECT 66.0 01/27/2015   TRIG 55 06/20/2020   CHOLHDL 2.3 06/20/2020    Hepatic Function Latest Ref Rng & Units 04/01/2021 03/31/2021 12/23/2020  Total Protein 6.5 - 8.1 g/dL 6.6 6.6 7.5  Albumin 3.5 - 5.0 g/dL 3.1(L) 3.2(L) 3.5  AST 15 - 41 U/L _0 ALT 0 - 44 U/L _1 Alk Phosphatase 38 - 126 U/L 88 102 105  Total Bilirubin 0.3 - 1.2 mg/dL 1.5(H) 0.2(L) 0.6  Bilirubin, Direct 0.0 - 0.2 mg/dL - - -    Lab Results  Component Value Date/Time   TSH 2.010 03/27/2021  03:36 PM   TSH 1.500 11/09/2019 12:21 AM   TSH 1.332 02/25/2019 03:32 AM   TSH 0.768 08/13/2010 08:36 AM    CBC Latest Ref Rng & Units 04/10/2021 04/03/2021 04/02/2021  WBC 3.4 - 10.8 x10E3/uL 5.3 6.2 5.2  Hemoglobin 11.1 - 15.9 g/dL 9.8(L) 9.7(L) 9.6(L)  Hematocrit 34.0 - 46.6 % 32.1(L) 30.6(L) 29.8(L)  Platelets 150 - 450 x10E3/uL 352 259 244    No results found for: VD25OH  Clinical ASCVD: Yes  The 10-year ASCVD risk score (Arnett DK, et al., 2019) is: 22.9%   Values used to  calculate the score:     Age: 40 years     Sex: Female     Is Non-Hispanic African American: Yes     Diabetic: Yes     Tobacco smoker: No     Systolic Blood Pressure: 889 mmHg     Is BP treated: Yes     HDL Cholesterol: 63 mg/dL     Total Cholesterol: 144 mg/dL    Depression screen [facility] 2/9 04/10/2021 01/09/2021 01/09/2021  Decreased Interest 0 - 1  Down, Depressed, Hopeless 0 - 0  PHQ - 2 Score 0 - 1  Altered sleeping 3 - 0  Tired, decreased energy 1 - 0  Change in appetite 3 - 0  Feeling bad or failure about yourself  0 - 3  Trouble concentrating 3 - 3  Moving slowly or fidgety/restless 0 - 0  Suicidal thoughts 1 - 1  PHQ-9 Score 11 - 8  Difficult doing work/chores Somewhat difficult Somewhat difficult Somewhat difficult  Some recent data might be hidden     Social History   Tobacco Use  Smoking Status Former   Packs/day: 0.50   Years: 25.00   Pack years: 12.50   Types: Cigarettes  Smokeless Tobacco Never  Tobacco Comments   quit smoking 20+ytrs ago   BP Readings from Last 3 Encounters:  04/10/21 124/60  04/03/21 (!) 142/65  03/27/21 128/70   Pulse Readings from Last 3 Encounters:  04/10/21 62  04/03/21 67  03/27/21 70   Wt Readings from Last 3 Encounters:  04/10/21 192 lb (87.1 kg)  04/03/21 193 lb 5.5 oz (87.7 kg)  03/27/21 194 lb 3.2 oz (88.1 kg)   BMI Readings from Last 3 Encounters:  04/10/21 36.28 kg/m  04/03/21 36.53 kg/m  03/27/21 38.96 kg/m    Assessment/Interventions: Review of patient past medical history, allergies, medications, health status, including review of consultants reports, laboratory and other test data, was performed as part of comprehensive evaluation and provision of chronic care management services.   SDOH:  (Social Determinants of Health) assessments and interventions performed: Yes SDOH Interventions    Flowsheet Row Most Recent Value  SDOH Interventions   Transportation Interventions [facility]       SDOH Screenings   Alcohol Screen: Not on file  Depression (PHQ2-9): Medium Risk   PHQ-2 Score: 11  Financial Resource Strain: Low Risk    Difficulty of Paying Living Expenses: Not hard at all  Food Insecurity: No Food Insecurity   Worried About Charity fundraiser in the Last Year: Never true   Arboriculturist in the Last Year: Never true  Housing: Not on file  Physical Activity: Inactive   Days of Exercise per Week: 0 days   Minutes of Exercise per Session: 0 min  Social Connections: Not on file  Stress: No Stress Concern Present   Feeling of Stress : Not at  all  Tobacco Use: Medium Risk   Smoking Tobacco Use: Former   Smokeless Tobacco Use: Never   Passive Exposure: Not on file  Transportation Needs: Unmet Transportation Needs   Lack of Transportation (Medical): Yes   Lack of Transportation (Non-Medical): Yes    CCM Care Plan  Allergies  Allergen Reactions   Other Anaphylaxis and Rash    Bleach   Penicillins Hives    Did it involve swelling of the face/tongue/throat, SOB, or low BP? No Did it involve sudden or severe rash/hives, skin peeling, or any reaction on the inside of your mouth or nose? Yes Did you need to seek medical attention at a hospital or doctor's office? Yes When did it last happen?      Childhood allergy  If all above answers are "NO", may proceed with cephalosporin use.     Sulfa Antibiotics Hives   Aspirin Other (See Comments)     On aspirin 81 mg - Rectal bleeding in dec 2018   Codeine     Headache and makes the patient feel "off"    Medications Reviewed Today     Reviewed by Mayford Knife, [place] (Pharmacist) on 04/26/21 at 1450  Med List Status: <None>   Medication Order Taking? Sig Documenting Provider Last Dose Status Informant  acetaminophen (TYLENOL) 500 MG tablet [id number] Yes Take 500-1,000 mg by mouth every 4 (four) hours as needed for moderate pain.  [provider]  Active Self  albuterol (VENTOLIN HFA) 108 (90 Base)  MCG/ACT inhaler [id number] Yes Inhale 1-2 puffs into the lungs every 6 (six) hours as needed for wheezing or shortness of breath. [provider]  Active Self  Alcohol Swabs (B-D SINGLE USE SWABS REGULAR) PADS [id number] Yes USE TWICE DAILY AS DIRECTED [name], MD  Active Self  amLODipine (NORVASC) 5 MG tablet [id number] Yes Take 1 tablet (5 mg total) by mouth daily. Larey Dresser, MD  Active   Ascorbic Acid (VITAMIN C) 1000 MG tablet [id number] Yes Take 1,000 mg by mouth daily. [provider]  Active Self  atorvastatin (LIPITOR) 10 MG tablet [id number] Yes TAKE 1 TABLET EVERY EVENING. [name], MD  Active Self  benzonatate (TESSALON PERLES) 100 MG capsule [id number] Yes Take 1 capsule (100 mg total) by mouth 3 (three) times daily as needed for cough. Bary Castilla, NP  Active Self           Med Note Nyra Capes, ROBIN   Mon Feb 06, 2021  2:11 PM) As needed   Blood Glucose Calibration (TRUE METRIX LEVEL 1) Low SOLN [id number] Yes Use as directed  Dx:e11.22 [name], MD  Active Self  Blood Glucose Monitoring Suppl (TRUE METRIX METER) w/Device KIT [id number] Yes Use as directed to check blood sugars 2 times per day dx: e11.22 [name], MD  Active Self  carvedilol (COREG) 25 MG tablet [id number] Yes Take 0.5 tablets (12.5 mg total) by mouth 2 (two) times daily with a meal. Elgergawy, Silver Huguenin, MD  Active   dexlansoprazole (DEXILANT) 60 MG capsule [id number] Yes Take 1 capsule (60 mg total) by mouth daily.  Patient taking differently: Take 60 mg by mouth every evening.   [name], MD  Active            Med Note Gilman Buttner, Dyann Ruddle M   Wed Jan 11, 2021 11:52 AM)    diclofenac sodium (VOLTAREN) 1 % GEL [id number] Yes Apply 2 g topically 4 (four) times daily as needed (pain). Geradine Girt,  DO  Active Self  ferrous sulfate 324 MG TBEC [id number] Yes Take 324 mg by mouth 2 (two) times daily. [provider]  Active Self  furosemide (LASIX) 80 MG tablet  [id number] Yes Take 0.5 tablets (40 mg total) by mouth daily. Elgergawy, Silver Huguenin, MD  Active   Glucagon (GVOKE HYPOPEN 1-PACK) 0.5 MG/0.1ML Darden Palmer [id number] Yes Inject 0.5 mg into the skin daily as needed. [name], MD  Active Self           Med Note Jimmey Ralph, [facility]   Sat Apr 01, 2021  1:01 AM) Hasn't started  glucose blood (TRUE METRIX BLOOD GLUCOSE TEST) test strip [id number] Yes USE AS DIRECTED TO CHECK BLOOD SUGARS 2 TIMES PER DAY [name], MD  Active Self  guaiFENesin-dextromethorphan [facility] DM) 100-10 MG/5ML syrup [id number] Yes Take 10 mLs by mouth every 6 (six) hours as needed for cough. [place] Donnamarie Poag, DO  Active Self  hydrALAZINE (APRESOLINE) 100 MG tablet [id number] Yes Take 0.5 tablets (50 mg total) by mouth 3 (three) times daily. Elgergawy, Silver Huguenin, MD  Active   hydrocortisone cream 1 % [id number] Yes Apply 1 application topically daily as needed for itching.  [provider]  Active Self  insulin lispro (HUMALOG KWIKPEN) 100 UNIT/ML KwikPen [id number] Yes Inject 0.02-0.04 mLs (2-4 Units total) into the skin 3 (three) times daily as needed (high blood sugar over 150). Bary Castilla, NP  Active Self  Insulin Pen Needle (B-D ULTRAFINE III SHORT PEN) 31G X 8 MM MISC [id number] Yes USE DAILY WITH VICTOZA AND/OR NOVOLOG. [name], MD  Active   Isopropyl Alcohol (ALCOHOL WIPES) 70 % MISC [id number] Yes Apply 1 each topically 2 (two) times daily. Minette Brine, FNP  Active Self  isosorbide mononitrate (IMDUR) 30 MG 24 hr tablet [id number] Yes TAKE 1 TABLET EVERY DAY (APPOINTMENT IS NEEDED FOR REFILLS) Leonie Man, MD  Active Self  lamoTRIgine (LAMICTAL) 150 MG tablet [id number] Yes Take 1 tablet (150 mg total) by mouth 2 (two) times daily. [name], MD  Active Self  levETIRAcetam (KEPPRA XR) 500 MG 24 hr tablet [id number] Yes Take 3 tablets every night  Patient taking differently: Take 500 mg by mouth in the morning and at bedtime.   Cameron Sprang,  MD  Active            Med Note Gilman Buttner, Dyann Ruddle M   Wed Jan 11, 2021 11:53 AM)    ondansetron (ZOFRAN) 4 MG tablet [id number] Yes Take 4 mg by mouth every 8 (eight) hours as needed for nausea or vomiting. [provider]  Active Self           Med Note Nyra Capes, ROBIN   Mon Feb 06, 2021  2:12 PM) As needed  OZEMPIC, 1 MG/DOSE, 4 MG/3ML SOPN [id number] Yes INJECT 1 MG INTO THE SKIN ONCE A WEEK. [name], MD  Active Self           Med Note Gilman Buttner, Juanda Crumble Jan 11, 2021 11:53 AM)    Pancrelipase, Lip-Prot-Amyl, 25000-79000 units CPEP [id number] Yes Take 1-2 capsules by mouth See admin instructions. Take 1 capsules in the morning with breakfast, 1 capsule with lunch, and 2 capsules with supper [provider]  Active Self           Med Note Stanford [place]   Wed Jan 11, 2021 11:55 AM)    potassium chloride SA (KLOR-CON) 20 MEQ tablet [id number] Yes TAKE 2 TABLETS EVERY  DAY BUT WHEN TAKING METOLAZONE TAKE 3 TABLETS DAILY AS DIRECTED  Patient taking differently: Take 40 mEq by mouth daily.   Leonie Man, MD  Active            Med Note Gilman Buttner, Dyann Ruddle M   Wed Jan 11, 2021 11:54 AM)    sennosides-docusate sodium (SENOKOT-S) 8.6-50 MG tablet [id number] Yes Take 1-2 tablets by mouth at bedtime as needed for constipation. [provider]  Active Self  sertraline (ZOLOFT) 50 MG tablet [id number] Yes TAKE 1 TABLET EVERY DAY [name], MD  Active Self           Med Note Clydene Laming Jan 11, 2021 11:54 AM)    tiotropium (SPIRIVA HANDIHALER) 18 MCG inhalation capsule [id number] Yes Place 1 capsule (18 mcg total) into inhaler and inhale daily. [place] Donnamarie Poag, DO  Active Self  tolnaftate (TINACTIN) 1 % cream [id number] Yes Apply 1 application topically daily as needed (foot fungus).  [provider]  Active Self  TOUJEO SOLOSTAR 300 UNIT/ML Solostar Pen [id number] Yes INJECT 36 UNITS INTO THE SKIN AT BEDTIME. [name], MD  Active    TRUEplus Lancets 33G [place] [id number] Yes Use as directed to check blood sugars 2 times per day dx: e11.22 [name], MD  Active Self            Patient Active Problem List   Diagnosis Date Noted   Acute on chronic blood loss anemia 04/01/2021   Hypokalemia 04/01/2021   SOB (shortness of breath) 11/14/2020   Postinflammatory pulmonary fibrosis ([place]) 06/08/2020   Lung nodule 06/08/2020   Chronic kidney disease, stage 2 (mild) 01/05/2020   Hypertensive chronic kidney disease with stage 1 through stage 4 chronic kidney disease, or unspecified chronic kidney disease 01/05/2020   Snoring 01/05/2020   Neuroleptic-induced parkinsonism ([place]) 09/28/2019   Class 3 severe obesity due to excess calories with serious comorbidity and body mass index (BMI) of 40.0 to 44.9 in adult ([place]) 05/22/2019   Pulmonary hypertension, unspecified ([place]) 04/06/2019   Syncope 02/24/2019   Rheumatoid arthritis ([place]) 01/21/2019   Radial styloid tenosynovitis 11/24/2018   Type 2 diabetes mellitus with stage 2 chronic kidney disease, with long-term current use of insulin ([place]) 07/21/2018   Chest pain 05/15/2018   Excessive daytime sleepiness 01/30/2018   Sleep-related hypoventilation 01/30/2018   Recurrent depression ([place]) 11/06/2017   OSA on CPAP 10/07/2017   Todd's paralysis ([place])    Seizure ([place]) 06/17/2017   Chronic diastolic CHF (congestive heart failure), NYHA class 2 (HCC)    Chronic respiratory failure with hypoxia (HCC)    Acute lower GI bleeding    Stroke-like symptoms 06/15/2017   Rectal bleeding 06/15/2017   Dehydration 03/07/2017   Medication management 12/24/2016   Near syncope 03/09/2016   Racing heart beat 03/09/2016   Dyspnea 01/28/2016   Spinal stenosis at L4-L5 level 10/04/2014   Parkinsonism ([place]) 06/24/2013   Lower abdominal pain 10/31/2012   DM2 (diabetes mellitus, type 2) ([place]) 10/31/2012   Seizure disorder ([place]) 10/31/2012   Diverticulosis 10/31/2012   Anemia associated  with acute blood loss 08/14/2012   Hematochezia 08/14/2012   Varicose veins of bilateral lower extremities with other complications 74/25/9563   Leg pain 03/21/2012   Radicular pain of left lower extremity 03/21/2012   Heart murmur 11/21/2011   Infected pseudocyst of pancreas 10/28/2011   Chronic pancreatitis ([place]) 10/28/2011   Hypertensive heart disease with chronic diastolic congestive heart failure ([place]) 10/28/2011  Dyslipidemia, goal LDL below 100 10/28/2011   Morbid obesity with BMI of 45.0-49.9, adult ([place]) 10/28/2011   Pseudoseizure 10/24/2011   Gastroesophageal reflux 10/23/2011   History of tobacco abuse 10/23/2011   Hypoxemia 10/23/2011   Disturbance in sleep behavior 09/03/2011   Partial epilepsy with impairment of consciousness ([place]) 09/03/2011   Tremor 09/03/2011   Peripheral edema 04/14/2011   Obesity hypoventilation syndrome ([place]) 10/26/2010   Atherosclerotic heart disease of native coronary artery with other forms of angina pectoris ([place]) 08/28/2010   Acute, but ill-defined, cerebrovascular disease 05/04/2008   Arthropathy 05/04/2008   Disequilibrium 05/04/2008   Generalized tonic clonic epilepsy ([place]) 05/04/2008   Increased frequency of urination 05/04/2008   Insomnia, unspecified 05/04/2008   Major depressive disorder, single episode, unspecified 05/04/2008   Memory loss 05/04/2008   Nausea 05/04/2008   Other malaise and fatigue 05/04/2008   Swelling of limb 05/04/2008    Immunization History  Administered Date(s) Administered   Fluad Quad(high Dose 65+) 06/06/2020, 03/27/2021   Influenza Split 04/11/2011, 06/30/2013   Influenza, High Dose Seasonal PF 02/14/2017, 04/24/2018   Influenza-Unspecified 04/07/2014   PFIZER(Purple Top)SARS-COV-2 Vaccination 08/06/2019, 08/31/2019, 05/04/2020, 11/08/2020   PNEUMOCOCCAL CONJUGATE-20 11/08/2020   Pneumococcal Polysaccharide-23 06/25/2010   Tdap 05/18/2012   Zoster, Live 04/14/2014    Conditions to be  addressed/monitored:  Diabetes and Peripheral Edema   Care Plan : CCM Pharmacy Cae Plan  Updates made by Mayford Knife, Rafael Capo since 05/03/2021 12:00 AM     Problem: DM II , Peripheral Edema      Long-Range Goal: Disease Management   Note:   Current Barriers:  Unable to maintain control of diabetes Does not contact provider office for questions/concerns  Pharmacist Clinical Goal(s):  Patient will achieve adherence to monitoring guidelines and medication adherence to achieve therapeutic efficacy through collaboration with PharmD and provider.   Interventions: 1:1 collaboration with [name], MD regarding development and update of comprehensive plan of care as evidenced by provider attestation and co-signature Inter-disciplinary care team collaboration (see longitudinal plan of care) Comprehensive medication review performed; medication list updated in electronic medical record  Diabetes (A1c goal <8%) -Controlled -Current medications: Toujeo solostar 300 unit/ml inject 36 units at bedtime  Ozempic 1 mg into the skin once a week Humalog kwikpen - inject into the skin three times daily as needed Ozempic 1 mg - inject 1 mg once per week  -Medications previously tried: three times per day everyday   -Current home glucose readings fasting glucose: 74, 04/24/2021, 10/16-118 post prandial glucose: 10/16-102 Bedtime:  -Reports hypoglycemic/hyperglycemic symptoms -Current meal patterns: she reports that she is doing well with her eating habits.  drinks: she is drinking water  -Current exercise: she does some exercise, she has circle like driveway that she walks around and listens to [employer] or praying. She use a cooking timer.  -Educated on A1c and blood sugar goals; Continuous glucose monitoring; Counseled to check feet daily and get yearly eye exams -She does have a GVOKE pen accessible to her , and her family is able to use it.  -Recommended to continue current  medication Collaborated with PCP team to order CGM machine for patient.   Peripheral Edema  (Goal: reduce swelling ) -Not ideally controlled -Current treatment  Furosemide 80 mg tablet - taking a 1/2 tablet by mouth daily  -Medications previously tried: Furosemide 80 mg tablet   -Patient reports that she is having swelling bilaterally in her legs since her dose was changed after her stay  in the hospital  -Recommended to continue current medication Recommended patient follow up with cardiologist, she reports that she is going to speak with them tomorrow.     Patient Goals/Self-Care Activities Patient will:  - take medications as prescribed as evidenced by patient report and record review  Follow Up Plan: The patient has been provided with contact information for the care management team and has been advised to call with any health related questions or concerns.       Medication Assistance: None required.  Patient affirms current coverage meets needs.  Compliance/Adherence/Medication fill history: Care Gaps: Shingrix Vaccine Foot Exam COVID-19 Vaccine  Star-Rating Drugs: Atorvastatin 10 mg tablet Ozempic 1 mg/dose  Patient's preferred pharmacy is:  [facility] [place] [address] Phone: [phone] Fax: [phone]  [facility] [address], [address] [place] [address] Phone: [phone] Fax: [phone]  Uses pill box? Yes Pt endorses 90% compliance  We discussed: Benefits of medication synchronization, packaging and delivery as well as enhanced pharmacist oversight with [facility]. Patient decided to: Continue current medication management strategy  Care Plan and Follow Up Patient Decision:  Patient agrees to Care Plan and Follow-up.  Plan: The patient has been provided with contact information for the care management  team and has been advised to call with any health related questions or concerns.   [name], CPP, PharmD Clinical Pharmacist Practitioner [facility] [phone]

## 2021-05-03 NOTE — Patient Instructions (Signed)
Visit Information It was great speaking with you today!  Please let me know if you have any questions about our visit.   Goals Addressed             This Visit's Progress    Manage My Medicine       Timeframe:  Long-Range Goal Priority:  High Start Date:                             Expected End Date:                       Follow Up Date 07/04/2021    - call for medicine refill 2 or 3 days before it runs out - call if I am sick and can't take my medicine - keep a list of all the medicines I take; vitamins and herbals too - learn to read medicine labels - use an alarm clock or phone to remind me to take my medicine    Why is this important?   These steps will help you keep on track with your medicines.   Notes:  Please call if you have any questions about your medications.        Patient Care Plan: CCM Pharmacy Care Plan     Problem Identified: DM II , Peripheral Edema      Long-Range Goal: Disease Management   Note:   Current Barriers:  Unable to maintain control of diabetes Does not contact provider office for questions/concerns  Pharmacist Clinical Goal(s):  Patient will achieve adherence to monitoring guidelines and medication adherence to achieve therapeutic efficacy through collaboration with PharmD and provider.   Interventions: 1:1 collaboration with [name], MD regarding development and update of comprehensive plan of care as evidenced by provider attestation and co-signature Inter-disciplinary care team collaboration (see longitudinal plan of care) Comprehensive medication review performed; medication list updated in electronic medical record  Diabetes (A1c goal <8%) -Controlled -Current medications: Toujeo solostar 300 unit/ml inject 36 units at bedtime  Ozempic 1 mg into the skin once a week Humalog kwikpen - inject into the skin three times daily as needed Ozempic 1 mg - inject 1 mg once per week  -Medications previously tried: three  times per day everyday   -Current home glucose readings fasting glucose: 74, 04/24/2021, 10/16-118 post prandial glucose: 10/16-102 Bedtime:  -Reports hypoglycemic/hyperglycemic symptoms -Current meal patterns: she reports that she is doing well with her eating habits.  drinks: she is drinking water  -Current exercise: she does some exercise, she has circle like driveway that she walks around and listens to [employer] or praying. She use a cooking timer.  -Educated on A1c and blood sugar goals; Continuous glucose monitoring; Counseled to check feet daily and get yearly eye exams -She does have a GVOKE pen accessible to her , and her family is able to use it.  -Recommended to continue current medication Collaborated with PCP team to order CGM machine for patient.   Peripheral Edema  (Goal: reduce swelling ) -Not ideally controlled -Current treatment  Furosemide 80 mg tablet - taking a 1/2 tablet by mouth daily  -Medications previously tried: Furosemide 80 mg tablet   -Patient reports that she is having swelling bilaterally in her legs since her dose was changed after her stay in the hospital  -Recommended to continue current medication Recommended patient follow up with cardiologist, she reports that she is going to speak  with them tomorrow.     Patient Goals/Self-Care Activities Patient will:  - take medications as prescribed as evidenced by patient report and record review  Follow Up Plan: The patient has been provided with contact information for the care management team and has been advised to call with any health related questions or concerns.        Patient agreed to services and verbal consent obtained.   The patient verbalized understanding of instructions, educational materials, and care plan provided today and agreed to receive a mailed copy of patient instructions, educational materials, and care plan.   [name], PharmD Clinical Pharmacist  Practitioner [facility] [phone]

## 2021-05-03 NOTE — Telephone Encounter (Signed)
Error

## 2021-05-04 ENCOUNTER — Other Ambulatory Visit: Payer: Self-pay

## 2021-05-04 ENCOUNTER — Ambulatory Visit (HOSPITAL_COMMUNITY)
Admission: RE | Admit: 2021-05-04 | Discharge: 2021-05-04 | Disposition: A | Discharge status: Home / Self Care | Payer: Medicare HMO | Source: Ambulatory Visit | Attending: Cardiology | Admitting: Cardiology

## 2021-05-04 VITALS — BP 140/80 | HR 64 | Wt 192.2 lb

## 2021-05-04 DIAGNOSIS — I272 Pulmonary hypertension, unspecified: Secondary | ICD-10-CM | POA: Diagnosis not present

## 2021-05-04 DIAGNOSIS — I5032 Chronic diastolic (congestive) heart failure: Secondary | ICD-10-CM

## 2021-05-04 DIAGNOSIS — Z79899 Other long term (current) drug therapy: Secondary | ICD-10-CM | POA: Insufficient documentation

## 2021-05-04 DIAGNOSIS — G4733 Obstructive sleep apnea (adult) (pediatric): Secondary | ICD-10-CM | POA: Insufficient documentation

## 2021-05-04 DIAGNOSIS — E669 Obesity, unspecified: Secondary | ICD-10-CM | POA: Insufficient documentation

## 2021-05-04 DIAGNOSIS — Z8616 Personal history of COVID-19: Secondary | ICD-10-CM | POA: Insufficient documentation

## 2021-05-04 DIAGNOSIS — I110 Hypertensive heart disease with heart failure: Secondary | ICD-10-CM | POA: Diagnosis not present

## 2021-05-04 DIAGNOSIS — E785 Hyperlipidemia, unspecified: Secondary | ICD-10-CM

## 2021-05-04 DIAGNOSIS — Z09 Encounter for follow-up examination after completed treatment for conditions other than malignant neoplasm: Secondary | ICD-10-CM | POA: Insufficient documentation

## 2021-05-04 DIAGNOSIS — Z9981 Dependence on supplemental oxygen: Secondary | ICD-10-CM | POA: Insufficient documentation

## 2021-05-04 DIAGNOSIS — Z87891 Personal history of nicotine dependence: Secondary | ICD-10-CM | POA: Insufficient documentation

## 2021-05-04 DIAGNOSIS — J439 Emphysema, unspecified: Secondary | ICD-10-CM | POA: Insufficient documentation

## 2021-05-04 DIAGNOSIS — J9611 Chronic respiratory failure with hypoxia: Secondary | ICD-10-CM | POA: Diagnosis not present

## 2021-05-04 LAB — BASIC METABOLIC PANEL
Anion gap: 8 (ref 5–15)
BUN: 14 mg/dL (ref 8–23)
CO2: 29 mmol/L (ref 22–32)
Calcium: 9.2 mg/dL (ref 8.9–10.3)
Chloride: 101 mmol/L (ref 98–111)
Creatinine, Ser: 0.85 mg/dL (ref 0.44–1.00)
GFR, Estimated: >60 mL/min (ref >60)
Glucose, Bld: 99 mg/dL (ref 70–99)
Potassium: 3.9 mmol/L (ref 3.5–5.1)
Sodium: 138 mmol/L (ref 135–145)

## 2021-05-04 LAB — BRAIN NATRIURETIC PEPTIDE: B Natriuretic Peptide: 33.1 pg/mL (ref 0.0–100.0)

## 2021-05-04 LAB — LIPID PANEL
Cholesterol: 145 mg/dL (ref 0–200)
HDL: 79 mg/dL (ref >40)
LDL Cholesterol: 63 mg/dL (ref 0–99)
Total CHOL/HDL Ratio: 1.8 RATIO
Triglycerides: 16 mg/dL (ref <150)
VLDL: 3 mg/dL (ref 0–40)

## 2021-05-04 LAB — CBC
HCT: 33.6 % — ABNORMAL LOW (ref 36.0–46.0)
Hemoglobin: 10.1 g/dL — ABNORMAL LOW (ref 12.0–15.0)
MCH: 23.4 pg — ABNORMAL LOW (ref 26.0–34.0)
MCHC: 30.1 g/dL (ref 30.0–36.0)
MCV: 78 fL — ABNORMAL LOW (ref 80.0–100.0)
Platelets: 312 10*3/uL (ref 150–400)
RBC: 4.31 MIL/uL (ref 3.87–5.11)
RDW: 17.5 % — ABNORMAL HIGH (ref 11.5–15.5)
WBC: 4.5 10*3/uL (ref 4.0–10.5)
nRBC: 0 % (ref 0.0–0.2)

## 2021-05-04 MED ORDER — HYDRALAZINE HCL 100 MG PO TABS: 100.0000 mg | ORAL_TABLET | Freq: Three times a day (TID) | ORAL | 3 refills | Status: DC

## 2021-05-04 NOTE — Patient Instructions (Signed)
Increase Hydalazine to 100 mg (1 tab) Three times a day   Labs done today, your results will be available in MyChart, we will contact you for abnormal readings.  Please do your Cardiomems reading [place]  Your physician recommends that you schedule a follow-up appointment in: 3 months  Do the following things EVERYDAY: Weigh yourself in the morning before breakfast. Write it down and keep it in a log. Take your medicines as prescribed Eat low salt foods--Limit salt (sodium) to 2000 mg per day.  Stay as active as you can everyday Limit all fluids for the day to less than 2 liters  If you have any questions or concerns before your next appointment please send [place] a message through [place] or call our office at [phone].    TO LEAVE A MESSAGE FOR THE NURSE SELECT OPTION 2, PLEASE LEAVE A MESSAGE INCLUDING: YOUR NAME DATE OF BIRTH CALL BACK NUMBER REASON FOR CALL**this is important as we prioritize the call backs  YOU WILL RECEIVE A CALL BACK THE SAME DAY AS LONG AS YOU CALL BEFORE 4:00 PM  At the [facility], you and your health needs are our priority. As part of our continuing mission to provide you with exceptional heart care, we have created designated Provider Care Teams. These Care Teams include your primary Cardiologist (physician) and Advanced Practice Providers (APPs- Physician Assistants and Nurse Practitioners) who all work together to provide you with the care you need, when you need it.   You may see any of the following providers on your designated Care Team at your next follow up: Dr Glori Bickers Dr Haynes Kerns, NP Lyda Jester, [place] [facility]  [place], [place] Audry Riles, PharmD   Please be sure to bring in all your medications bottles to every appointment.

## 2021-05-04 NOTE — Addendum Note (Signed)
Encounter addended by: Larey Dresser, MD on: 05/04/2021 11:29 PM  Actions taken: Level of Service modified, Visit diagnoses modified

## 2021-05-04 NOTE — Progress Notes (Signed)
PCP: [name], MD Cardiology: Dr. Ellyn Hack HF Cardiology: Dr. [name]  75 y.o. with history of OHS/OSA and RV dysfunction was referred by Dr. Ellyn Hack for evaluation of CHF.  She has a history of OSA/OHS.  She uses CPAP at night but not currently using oxygen during the day.  Remote smoker.  She had an echo in 9/20 showing EF 50-55% but moderate-severe RV dysfunction with mild-moderate RV dilation.  RHC/LHC in 10/20 showed normal coronaries, pulmonary venous hypertension.  V/Q scan in 2/21 did not show evidence for acute on chronic PE.    Cardiomems was placed in 5/21.  RHC at that time showed normal filling pressures and moderate pulmonary hypertension but suspect due to high output.  No evidence for shunt lesion.  Echo showed EF 60-65%, enlarged RV with normal systolic function.   She was admitted in 5/21 with syncope, thought to be due to a seizure.    She had COVID-19 infection in 8/21. 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   Echo in 9/21 with EF 50-55%, severe RV enlargement and severely decreased RV systolic function, PASP 54 mmHg.    She was admitted in 5/22 with CHF, bronchitis.  She was diuresed and treated with antibiotics. Echo in 5/22 showed EF 60-65%, mild LVH, ?normal RV, severe LAE, ?normal PA pressure, IVC normal.  V/Q scan negative.   In 10/22, she was admitted with GI bleeding, thought to be diverticular.  BP meds were decreased and Lasix was cut back.  With increased weight, she increased her Lasix back to 80 mg daily.   She returns for followup of CHF.  Weight is down 7 lbs. She is on semaglutide.  BP is elevated today.  She walks with a cane.  No dyspnea walking for short distances on flat ground.  She gets short of breath with stairs and inclines.  No chest pain. No BRBPR/melena.  She tries to walk for 20 minutes or so around her yard on most days.    ECG (personally  reviewed): NSR, 1st degree AVB, iRBBB  Cardiomems PADP 21 mmHg (goal 23).   Labs (10/20): K 4.1, creatinine 1.15, hgb 11.4 Labs (6/21): K 3.5, creatinine 1.04, BNP 81 Labs (12/21): K 3.7, creatinine 0.79 Labs (10/22): K 3.9, creatinine 0.87  PMH: 1. OHS/OSA: She uses CPAP.  2. H/o CVA 3. Seizure disorder 4. Type 2 DM 5. Depression 6. HTN 7. Hyperlipidemia 8. Chronic diastolic CHF: Echo (9/16) with EF 50-55%, moderate to severe RV dilation with mild-moderately decreased RV systolic function.  - RHC/LHC (10/20): normal coronaries; mean RA 10, PA 56/14 mean 31, mean PCWP 13, LVEDP 18, CO/CI 7.35/3.73, PVR 2.44.  - RHC/Cardiomems placement (5/21): mean RA 7, PA 58/21 mean 34, PCWP mean 12, CI 8.3 F/3.95 T, PVR 2.86 WU. No evidence for shunt lesion.  - Echo (5/21): EF 60-65%, RV enlarged with normal systolic function.  - Echo (9/21): EF 50-55%, severe RV enlargement and severely decreased RV systolic function, PASP 54 mmHg.  - Echo (5/22): EF 60-65%, mild LVH, ?normal RV, severe LAE, ?normal PA pressure, IVC normal. 9. Primarily pulmonary venous hypertension.  - V/Q scan (2/21, 5/22): No evidence for acute or chronic PE.  - PFTs normal in 5/21.  10. COVID-19 PNA 8/21.  - CT chest in 10/21 with emphysema and mild fibrosis concerning for post-infectious inflammatory fibrosis from COVID.  11. GI bleeding: 10/22, diverticular bleed.   Social  History   Socioeconomic History   Marital status: Divorced    Spouse name: Not on file   Number of children: Not on file   Years of education: Not on file   Highest education level: Not on file  Occupational History   Occupation: Disabled    Comment: CNA  Tobacco Use   Smoking status: Former    Packs/day: 0.50    Years: 25.00    Pack years: 12.50    Types: Cigarettes   Smokeless tobacco: Never   Tobacco comments:    quit smoking 20+ytrs ago  Vaping Use   Vaping Use: Never used  Substance and Sexual Activity   Alcohol use: No   Drug  use: No   Sexual activity: Not Currently    Birth control/protection: Surgical  Other Topics Concern   Not on file  Social History Narrative   She lives in [place]. She has lots of family in the area and is accompanied by her sister.   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   She has a distant history of smoking, quit 20 years ago.   Social Determinants of Health   Financial Resource Strain: Low Risk    Difficulty of Paying Living Expenses: Not hard at all  Food Insecurity: No Food Insecurity   Worried About Charity fundraiser in the Last Year: Never true   Arboriculturist in the Last Year: Never true  Transportation Needs: Public librarian (Medical): Yes   Lack of Transportation (Non-Medical): Yes  Physical Activity: Inactive   Days of Exercise per Week: 0 days   Minutes of Exercise per Session: 0 min  Stress: No Stress Concern Present   Feeling of Stress : Not at all  Social Connections: Not on file  Intimate Partner Violence: Not on file   Family History  Problem Relation Age of Onset   Allergies Father    Heart disease Father        before 32   Heart failure Father    Hypertension Father    Hyperlipidemia Father    Heart disease Mother    Diabetes Mother    Hypertension Mother    Hyperlipidemia Mother    Hypertension Sister    Heart disease Sister        before 80   Hyperlipidemia Sister    Diabetes Brother    Hypertension Brother    Diabetes Sister    Hypertension Sister    Diabetes Son    Hypertension Son    Cancer Other    Current Outpatient Medications  Medication Sig Dispense Refill   acetaminophen (TYLENOL) 500 MG tablet Take 500-1,000 mg by mouth every 4 (four) hours as needed for moderate pain.      albuterol (VENTOLIN HFA) 108 (90 Base) MCG/ACT inhaler Inhale 1-2 puffs into the lungs every 6 (six) hours as needed for wheezing or shortness of breath.     Alcohol Swabs (B-D  SINGLE USE SWABS REGULAR) PADS USE TWICE DAILY AS DIRECTED 200 each 6   Ascorbic Acid (VITAMIN C) 1000 MG tablet Take 1,000 mg by mouth daily.     atorvastatin (LIPITOR) 10 MG tablet TAKE 1 TABLET EVERY EVENING. 90 tablet 2   benzonatate (TESSALON PERLES) 100 MG capsule Take 1 capsule (100 mg total) by mouth 3 (three) times daily as needed for cough. 30 capsule 1   Blood Glucose Calibration (TRUE METRIX LEVEL 1) Low  SOLN Use as directed  Dx:e11.22 1 each 2   Blood Glucose Monitoring Suppl (TRUE METRIX METER) w/Device KIT Use as directed to check blood sugars 2 times per day dx: e11.22 1 kit 1   carvedilol (COREG) 25 MG tablet Take 0.5 tablets (12.5 mg total) by mouth 2 (two) times daily with a meal. 180 tablet 2   dexlansoprazole (DEXILANT) 60 MG capsule Take 1 capsule (60 mg total) by mouth daily. 90 capsule 2   diclofenac sodium (VOLTAREN) 1 % GEL Apply 2 g topically 4 (four) times daily as needed (pain).     ferrous sulfate 324 MG TBEC Take 324 mg by mouth 2 (two) times daily.     furosemide (LASIX) 80 MG tablet Take 80 mg by mouth daily.     Glucagon (GVOKE HYPOPEN 1-PACK) 0.5 MG/0.1ML SOAJ Inject 0.5 mg into the skin daily as needed. 0.1 mL 11   glucose blood (TRUE METRIX BLOOD GLUCOSE TEST) test strip USE AS DIRECTED TO CHECK BLOOD SUGARS 2 TIMES PER DAY 200 strip 2   guaiFENesin-dextromethorphan (ROBITUSSIN DM) 100-10 MG/5ML syrup Take 10 mLs by mouth every 6 (six) hours as needed for cough. 118 mL 0   hydrocortisone cream 1 % Apply 1 application topically daily as needed for itching.      insulin lispro (HUMALOG KWIKPEN) 100 UNIT/ML KwikPen Inject 0.02-0.04 mLs (2-4 Units total) into the skin 3 (three) times daily as needed (high blood sugar over 150). 15 mL 2   Insulin Pen Needle (B-D ULTRAFINE III SHORT PEN) 31G X 8 MM MISC USE DAILY WITH VICTOZA AND/OR NOVOLOG. 400 each 2   Isopropyl Alcohol (ALCOHOL WIPES) 70 % MISC Apply 1 each topically 2 (two) times daily. 300 each 3   isosorbide  mononitrate (IMDUR) 30 MG 24 hr tablet TAKE 1 TABLET EVERY DAY (APPOINTMENT IS NEEDED FOR REFILLS) 90 tablet 2   lamoTRIgine (LAMICTAL) 150 MG tablet Take 1 tablet (150 mg total) by mouth 2 (two) times daily. 180 tablet 3   levETIRAcetam (KEPPRA) 500 MG tablet Take 1,000 mg by mouth at bedtime.     ondansetron (ZOFRAN) 4 MG tablet Take 4 mg by mouth every 8 (eight) hours as needed for nausea or vomiting.     OZEMPIC, 1 MG/DOSE, 4 MG/3ML SOPN INJECT 1 MG INTO THE SKIN ONCE A WEEK. 9 mL 3   Pancrelipase, Lip-Prot-Amyl, 25000-79000 units CPEP Take 1-2 capsules by mouth See admin instructions. Take 1 capsules in the morning with breakfast, 1 capsule with lunch, and 2 capsules with supper     potassium chloride SA (KLOR-CON) 20 MEQ tablet TAKE 2 TABLETS EVERY DAY BUT WHEN TAKING METOLAZONE TAKE 3 TABLETS DAILY AS DIRECTED 204 tablet 2   sennosides-docusate sodium (SENOKOT-S) 8.6-50 MG tablet Take 1-2 tablets by mouth at bedtime as needed for constipation.     sertraline (ZOLOFT) 50 MG tablet TAKE 1 TABLET EVERY DAY 90 tablet 2   tiotropium (SPIRIVA HANDIHALER) 18 MCG inhalation capsule Place 1 capsule (18 mcg total) into inhaler and inhale daily. 90 capsule 3   tolnaftate (TINACTIN) 1 % cream Apply 1 application topically daily as needed (foot fungus).      TOUJEO SOLOSTAR 300 UNIT/ML Solostar Pen INJECT 36 UNITS INTO THE SKIN AT BEDTIME. 13.5 mL 1   TRUEplus Lancets 33G MISC Use as directed to check blood sugars 2 times per day dx: e11.22 300 each 2   hydrALAZINE (APRESOLINE) 100 MG tablet Take 1 tablet (100 mg total) by mouth 3 (  three) times daily. 270 tablet 3   No current facility-administered medications for this encounter.   BP 140/80   Pulse 64   Wt 87.2 kg (192 lb 3.2 oz)   SpO2 97%   BMI 36.32 kg/m  General: NAD Neck: No JVD, no thyromegaly or thyroid nodule.  Lungs: Clear to auscultation bilaterally with normal respiratory effort. CV: Nondisplaced PMI.  Heart regular S1/S2, no S3/S4,  no murmur.  No peripheral edema.  No carotid bruit.  Normal pedal pulses.  Abdomen: Soft, nontender, no hepatosplenomegaly, no distention.  Skin: Intact without lesions or rashes.  Neurologic: Alert and oriented x 3.  Psych: Normal affect. Extremities: No clubbing or cyanosis.  HEENT: Normal.   Assessment/Plan: 1. Chronic diastolic CHF/RV failure: Echo in 9/20 showed EF 50-55% with moderate-severe RV dilation and mild-moderately decreased systolic function. RHC/LHC showed mild-moderate pulmonary venous hypertension and no significant coronary disease.  Cause of RV dysfunction is not clear, possibly due to diastolic CHF + OHS/OSA.  V/Q scan in 2/21 did not show acute or chronic PE.  Cardiomems was placed in 5/21, normal filling pressures with moderate pulmonary hypertension likely due to high output (no evidence for shunt lesion on RHC).  Echo in 9/21 with EF 50-55%, severe RVE with severe RV dysfunction.  Echo in 5/22 with EF 60-65%, RV function reportedly normal with normal PA pressure.  She is not volume overloaded today by exam or Cardiomems and weight is down.  However, she has chronic NYHA class III symptoms.  I suspect that her dyspnea is primarily pulmonary, based on last CT, she has developed mild pulmonary fibrosis since COVID, possibly post-inflammatory.   - Continue Lasix 80 mg daily. BMET/BNP today.  - She did not tolerate SGLT2-inhibitor due to recurrent yeast infections.    2. OHS/OSA: She is using CPAP but not oxygen during the day (no longer appears to need). CT chest in 10/21 with emphysema and mild fibrosis, possibly post-inflammatory after COVID-19 infection.   - Followup with pulmonary (Dr. Vaughan Browner).  3. Pulmonary hypertension: RHC in 10/20 showed mild-moderate pulmonary venous hypertension.  RHC in 5/21 showed moderate pulmonary hypertension with low PVR and high CO, likely PH due to high output, no evidence for shunt lesion. No role for selective pulmonary vasodilators.  4. HTN:  She is off amlodipine since last hospitalization.  BP is elevated, I will have her increase hydralazine back to 100 mg tid.  5. Obesity: She is on semaglutide.  6. GI bleeding: No overt bleeding now.   - CBC today.   Followup in 3 months with APP.   Loralie Champagne 05/04/2021

## 2021-05-05 ENCOUNTER — Other Ambulatory Visit: Payer: Self-pay | Admitting: Internal Medicine

## 2021-05-15 ENCOUNTER — Ambulatory Visit: Payer: Medicare HMO | Admitting: Internal Medicine

## 2021-05-24 DIAGNOSIS — E1122 Type 2 diabetes mellitus with diabetic chronic kidney disease: Secondary | ICD-10-CM

## 2021-05-24 DIAGNOSIS — R609 Edema, unspecified: Secondary | ICD-10-CM

## 2021-05-24 DIAGNOSIS — Z794 Long term (current) use of insulin: Secondary | ICD-10-CM | POA: Diagnosis not present

## 2021-05-24 DIAGNOSIS — N182 Chronic kidney disease, stage 2 (mild): Secondary | ICD-10-CM

## 2021-05-26 ENCOUNTER — Telehealth: Payer: Self-pay

## 2021-05-26 NOTE — Progress Notes (Signed)
Chronic Care Management Pharmacy Assistant   Name: [name]  MRN: [id number] DOB: 02-16-1946   Reason for Encounter: Disease State/General Adherence Call    Recent consult visits:  [name] Champagne MD (Cardiology) - discontinue Hydralazine 7m and start 578m3 times a day    Hospital visits:  Medication Reconciliation was completed by comparing discharge summary, patient's EMR and Pharmacy list, and upon discussion with patient.  Admitted to the hospital on 03/31/2021 due to Acute lower GI bleeding. Discharge date was 04/03/2021. [facility] None  Medication Changes at Hospital Discharge: -Changed Carvedilol 12.5 mg 2 times a day, Furosemide 40 mg daily- start 4 days from discharge, Hydralazine 50 mg 3 times a day due to Acute GI bleeding.  Medications Discontinued at Hospital Discharge: -Stopped Amlodipine 5 mg  due to Acute GI bleeding  Medications that remain the same after Hospital Discharge:??  -All other medications will remain the same.     Hospital visits:  Medication Reconciliation was completed by comparing discharge summary, patient's EMR and Pharmacy list, and upon discussion with patient.  Admitted to the hospital on 12/23/2020 due to nonspecific chest pain. Discharge date was 12/24/2020. [facility]  Medication Changes at Hospital Discharge: -Changed None  Medications Discontinued at Hospital Discharge: None  Medications that remain the same after Hospital Discharge:??  -All other medications will remain the same.    Medications: Outpatient Encounter Medications as of 05/26/2021  Medication Sig   acetaminophen (TYLENOL) 500 MG tablet Take 500-1,000 mg by mouth every 4 (four) hours as needed for moderate pain.    albuterol (VENTOLIN HFA) 108 (90 Base) MCG/ACT  inhaler Inhale 1-2 puffs into the lungs every 6 (six) hours as needed for wheezing or shortness of breath.   Alcohol Swabs (B-D SINGLE USE SWABS REGULAR) PADS USE TWICE DAILY AS DIRECTED   Ascorbic Acid (VITAMIN C) 1000 MG tablet Take 1,000 mg by mouth daily.   atorvastatin (LIPITOR) 10 MG tablet TAKE 1 TABLET EVERY EVENING.   benzonatate (TESSALON PERLES) 100 MG capsule Take 1 capsule (100 mg total) by mouth 3 (three) times daily as needed for cough.   Blood Glucose Calibration (TRUE METRIX LEVEL 1) Low SOLN Use as directed  Dx:e11.22   Blood Glucose Monitoring Suppl (TRUE METRIX METER) w/Device KIT USE AS DIRECTED   carvedilol (COREG) 25 MG tablet Take 0.5 tablets (12.5 mg total) by mouth 2 (two) times daily with a meal.   dexlansoprazole (DEXILANT) 60 MG capsule Take 1 capsule (60 mg total) by mouth daily.   diclofenac sodium (VOLTAREN) 1 % GEL Apply 2 g topically 4 (four) times daily as needed (pain).   ferrous sulfate 324 MG TBEC Take 324 mg by mouth 2 (two) times daily.   furosemide (LASIX) 80 MG tablet Take 80 mg by mouth daily.   Glucagon (GVOKE HYPOPEN 1-PACK) 0.5 MG/0.1ML SOAJ Inject 0.5 mg into the skin daily as needed.   glucose blood (TRUE METRIX BLOOD GLUCOSE TEST) test strip USE AS DIRECTED TO CHECK BLOOD SUGARS 2 TIMES PER DAY   guaiFENesin-dextromethorphan (ROBITUSSIN DM) 100-10 MG/5ML syrup Take 10 mLs by mouth every 6 (six) hours as needed for cough.   hydrALAZINE (APRESOLINE) 100 MG tablet Take 1 tablet (100 mg total) by mouth 3 (three) times daily.   hydrocortisone cream 1 % Apply 1 application topically daily as  needed for itching.    insulin lispro (HUMALOG KWIKPEN) 100 UNIT/ML KwikPen Inject 0.02-0.04 mLs (2-4 Units total) into the skin 3 (three) times daily as needed (high blood sugar over 150).   Insulin Pen Needle (B-D ULTRAFINE III SHORT PEN) 31G X 8 MM MISC USE DAILY WITH VICTOZA AND/OR NOVOLOG.   Isopropyl Alcohol (ALCOHOL WIPES) 70 % MISC Apply 1 each topically 2  (two) times daily.   isosorbide mononitrate (IMDUR) 30 MG 24 hr tablet TAKE 1 TABLET EVERY DAY (APPOINTMENT IS NEEDED FOR REFILLS)   lamoTRIgine (LAMICTAL) 150 MG tablet Take 1 tablet (150 mg total) by mouth 2 (two) times daily.   levETIRAcetam (KEPPRA) 500 MG tablet Take 1,000 mg by mouth at bedtime.   ondansetron (ZOFRAN) 4 MG tablet Take 4 mg by mouth every 8 (eight) hours as needed for nausea or vomiting.   OZEMPIC, 1 MG/DOSE, 4 MG/3ML SOPN INJECT 1 MG INTO THE SKIN ONCE A WEEK.   Pancrelipase, Lip-Prot-Amyl, 25000-79000 units CPEP Take 1-2 capsules by mouth See admin instructions. Take 1 capsules in the morning with breakfast, 1 capsule with lunch, and 2 capsules with supper   potassium chloride SA (KLOR-CON) 20 MEQ tablet TAKE 2 TABLETS EVERY DAY BUT WHEN TAKING METOLAZONE TAKE 3 TABLETS DAILY AS DIRECTED   sennosides-docusate sodium (SENOKOT-S) 8.6-50 MG tablet Take 1-2 tablets by mouth at bedtime as needed for constipation.   sertraline (ZOLOFT) 50 MG tablet TAKE 1 TABLET EVERY DAY   tiotropium (SPIRIVA HANDIHALER) 18 MCG inhalation capsule Place 1 capsule (18 mcg total) into inhaler and inhale daily.   tolnaftate (TINACTIN) 1 % cream Apply 1 application topically daily as needed (foot fungus).    TOUJEO SOLOSTAR 300 UNIT/ML Solostar Pen INJECT 36 UNITS INTO THE SKIN AT BEDTIME.   TRUEplus Lancets 33G MISC Use as directed to check blood sugars 2 times per day dx: e11.22   No facility-administered encounter medications on file as of 05/26/2021.     Contacted [name] for general disease state and medication adherence call.   Patient is not > 5 days past due for refill on the following medications per chart history:   What concerns do you have about your medications?  The patient denies side effects with her medications.   How often do you forget or accidentally miss a dose? Never  Do you use a pillbox? Yes  Are you having any problems getting your medications from your  pharmacy? No  Has the cost of your medications been a concern? No If yes, what medication and is patient assistance available or has it been applied for?  Since last visit with CPP, no interventions have been made:   The patient has not had an ED visit since last contact.   The patient denies problems with their health.   she denies  concerns or questions for [name], at this time.   Patient states BP and BG readings are as follows:  Fasting: 76  Before meals:120   After meals:98  Bedtime:143    DATE:             BP               PULSE 05/28/2021         142/70           61 05/27/2021           160/63          79 05/26/2021  154/73          64 05/25/2021              151/72         73   Care Gaps: Shingrix Vaccine overdue Foot Exam over due COVID-19 Vaccine over due  Star Rating Drugs:  Atorvastatin 10 mg    filled  03/24/21  [place] 1 mg/dose  filled 03/14/21  [place] Clinical Pharmacist Assistant [phone]

## 2021-05-29 ENCOUNTER — Telehealth: Payer: Self-pay

## 2021-05-29 ENCOUNTER — Other Ambulatory Visit: Payer: Self-pay | Admitting: Cardiology

## 2021-05-30 NOTE — Telephone Encounter (Signed)
Patient was in hospital from 03/31/21 to 04/03/21. Medication was decrease to  carvedilol 12.5 mg  twice a day  from 25 mg  twice a day.  Patient was seen  05/04/21 at heart failure clinic- medication was not changed during that visit.  Will send prescription  (carvedilol 12.5 mg  twice a day ) into  mail order. Pharmacy - under  HF clinic doctor. Patient does not need to  see primary cardiologist ( Dr Ellyn Hack)  until requested by  HF  clinic ( Dr [name])

## 2021-05-31 ENCOUNTER — Encounter: Payer: Medicare HMO | Admitting: Nurse Practitioner

## 2021-05-31 NOTE — Progress Notes (Signed)
A user error has taken place: encounter opened in error, closed for administrative reasons.

## 2021-05-31 NOTE — Progress Notes (Signed)
No show

## 2021-05-31 NOTE — Patient Instructions (Signed)

## 2021-06-03 ENCOUNTER — Ambulatory Visit
Admission: RE | Admit: 2021-06-03 | Discharge: 2021-06-03 | Disposition: A | Discharge status: Home / Self Care | Payer: Medicare HMO | Source: Ambulatory Visit | Attending: Internal Medicine | Admitting: Internal Medicine

## 2021-06-03 DIAGNOSIS — K5730 Diverticulosis of large intestine without perforation or abscess without bleeding: Secondary | ICD-10-CM | POA: Diagnosis not present

## 2021-06-03 DIAGNOSIS — K439 Ventral hernia without obstruction or gangrene: Secondary | ICD-10-CM

## 2021-06-05 ENCOUNTER — Other Ambulatory Visit: Payer: Self-pay | Admitting: Internal Medicine

## 2021-06-05 ENCOUNTER — Ambulatory Visit: Payer: Medicare HMO | Admitting: Pulmonary Disease

## 2021-06-05 DIAGNOSIS — N281 Cyst of kidney, acquired: Secondary | ICD-10-CM

## 2021-06-08 ENCOUNTER — Encounter: Payer: Self-pay | Admitting: Pulmonary Disease

## 2021-06-08 ENCOUNTER — Other Ambulatory Visit: Payer: Self-pay

## 2021-06-08 ENCOUNTER — Ambulatory Visit: Payer: Medicare HMO | Admitting: Pulmonary Disease

## 2021-06-08 VITALS — BP 138/64 | HR 66 | Temp 98.4°F | Ht 61.0 in | Wt 193.8 lb

## 2021-06-08 DIAGNOSIS — G4733 Obstructive sleep apnea (adult) (pediatric): Secondary | ICD-10-CM | POA: Diagnosis not present

## 2021-06-08 DIAGNOSIS — Z9989 Dependence on other enabling machines and devices: Secondary | ICD-10-CM

## 2021-06-08 DIAGNOSIS — J8410 Pulmonary fibrosis, unspecified: Secondary | ICD-10-CM

## 2021-06-08 MED ORDER — ALBUTEROL SULFATE HFA 108 (90 BASE) MCG/ACT IN AERS: 1.0000 | INHALATION_SPRAY | Freq: Four times a day (QID) | RESPIRATORY_TRACT | 5 refills | Status: DC | PRN

## 2021-06-08 NOTE — Patient Instructions (Signed)
I am glad you are stable with your breathing Continue Breo inhaler Follow-up in 6 months

## 2021-06-08 NOTE — Progress Notes (Signed)
[name]    [id number]    [birth year]  Primary Care Physician:Sanders, Bailey Mech, MD  Referring Physician: [name], MD [address] [place],   [id number]  Chief complaint: Follow-up for post COVID-64  HPI: 75 year old with OSA.  Developed COVID-19 at the end of August.  Treated with monoclonal antibody on 02/23/2020. She was treated at home with remote health with dexamethasone, azithromycin, supplemental oxygen. She had a rough first week but feels that she is slowly improving.  Continues to have cough with white mucus  Supplemental oxygen stopped in December 2021 due to continued improvement  Hospitalized from 5/22 to 11/18/2020 for acute respiratory failure secondary to bronchitis. She had a CT of the chest which showed chronic changes and no acute infiltrate.  She was treated with antibiotics, steroids.  Ultimately treated for heart failure with Lasix.  Pets: Has dogs, no cats, birds, farm animals Occupation: Retired Psychologist, counselling Exposures: No known exposures.  No mold, hot tub, Jacuzzi Smoking history: 13-pack-year smoker.  Quit in 1990 Travel history: Previously lived in [place].  No significant recent travel Relevant family history: No significant family history of lung disease  Interim history: Continues on Breo inhaler.  Complains of stable dyspnea on exertion  Outpatient Encounter Medications as of 06/08/2021  Medication Sig   acetaminophen (TYLENOL) 500 MG tablet Take 500-1,000 mg by mouth every 4 (four) hours as needed for moderate pain.    Alcohol Swabs (B-D SINGLE USE SWABS REGULAR) PADS USE TWICE DAILY AS DIRECTED   Ascorbic Acid (VITAMIN C) 1000 MG tablet Take 1,000 mg by mouth daily.   atorvastatin (LIPITOR) 10 MG tablet TAKE 1 TABLET EVERY EVENING.   benzonatate (TESSALON PERLES) 100 MG capsule Take 1 capsule (100 mg total) by mouth 3 (three) times daily as needed for cough.   Blood Glucose Calibration (TRUE METRIX  LEVEL 1) Low SOLN Use as directed  Dx:e11.22   Blood Glucose Monitoring Suppl (TRUE METRIX METER) w/Device KIT USE AS DIRECTED   carvedilol (COREG) 12.5 MG tablet Take 1 tablet (12.5 mg total) by mouth 2 (two) times daily.   dexlansoprazole (DEXILANT) 60 MG capsule Take 1 capsule (60 mg total) by mouth daily.   diclofenac sodium (VOLTAREN) 1 % GEL Apply 2 g topically 4 (four) times daily as needed (pain).   ferrous sulfate 324 MG TBEC Take 324 mg by mouth 2 (two) times daily.   furosemide (LASIX) 80 MG tablet Take 80 mg by mouth daily.   Glucagon (GVOKE HYPOPEN 1-PACK) 0.5 MG/0.1ML SOAJ Inject 0.5 mg into the skin daily as needed.   glucose blood (TRUE METRIX BLOOD GLUCOSE TEST) test strip USE AS DIRECTED TO CHECK BLOOD SUGARS 2 TIMES PER DAY   guaiFENesin-dextromethorphan (ROBITUSSIN DM) 100-10 MG/5ML syrup Take 10 mLs by mouth every 6 (six) hours as needed for cough.   hydrALAZINE (APRESOLINE) 100 MG tablet Take 1 tablet (100 mg total) by mouth 3 (three) times daily.   hydrocortisone cream 1 % Apply 1 application topically daily as needed for itching.    insulin lispro (HUMALOG KWIKPEN) 100 UNIT/ML KwikPen Inject 0.02-0.04 mLs (2-4 Units total) into the skin 3 (three) times daily as needed (high blood sugar over 150).   Insulin Pen Needle (B-D ULTRAFINE III SHORT PEN) 31G X 8 MM MISC USE DAILY WITH VICTOZA AND/OR NOVOLOG.   Isopropyl Alcohol (ALCOHOL WIPES) 70 % MISC Apply 1 each topically 2 (two)  times daily.   isosorbide mononitrate (IMDUR) 30 MG 24 hr tablet TAKE 1 TABLET EVERY DAY (APPOINTMENT IS NEEDED FOR REFILLS)   lamoTRIgine (LAMICTAL) 150 MG tablet Take 1 tablet (150 mg total) by mouth 2 (two) times daily.   levETIRAcetam (KEPPRA) 500 MG tablet Take 1,000 mg by mouth at bedtime.   ondansetron (ZOFRAN) 4 MG tablet Take 4 mg by mouth every 8 (eight) hours as needed for nausea or vomiting.   OZEMPIC, 1 MG/DOSE, 4 MG/3ML SOPN INJECT 1 MG INTO THE SKIN ONCE A WEEK.   Pancrelipase,  Lip-Prot-Amyl, 25000-79000 units CPEP Take 1-2 capsules by mouth See admin instructions. Take 1 capsules in the morning with breakfast, 1 capsule with lunch, and 2 capsules with supper   potassium chloride SA (KLOR-CON) 20 MEQ tablet TAKE 2 TABLETS EVERY DAY BUT WHEN TAKING METOLAZONE TAKE 3 TABLETS DAILY AS DIRECTED   sennosides-docusate sodium (SENOKOT-S) 8.6-50 MG tablet Take 1-2 tablets by mouth at bedtime as needed for constipation.   sertraline (ZOLOFT) 50 MG tablet TAKE 1 TABLET EVERY DAY   tiotropium (SPIRIVA HANDIHALER) 18 MCG inhalation capsule Place 1 capsule (18 mcg total) into inhaler and inhale daily.   tolnaftate (TINACTIN) 1 % cream Apply 1 application topically daily as needed (foot fungus).    TOUJEO SOLOSTAR 300 UNIT/ML Solostar Pen INJECT 36 UNITS INTO THE SKIN AT BEDTIME.   TRUEplus Lancets 33G MISC Use as directed to check blood sugars 2 times per day dx: e11.22   albuterol (VENTOLIN HFA) 108 (90 Base) MCG/ACT inhaler Inhale 1-2 puffs into the lungs every 6 (six) hours as needed for wheezing or shortness of breath.   [DISCONTINUED] albuterol (VENTOLIN HFA) 108 (90 Base) MCG/ACT inhaler Inhale 1-2 puffs into the lungs every 6 (six) hours as needed for wheezing or shortness of breath. (Patient not taking: Reported on 06/08/2021)   No facility-administered encounter medications on file as of 06/08/2021.   Physical Exam: Blood pressure 138/64, pulse 66, temperature 98.4 F (36.9 C), temperature source Oral, height 5' 1" (1.549 m), weight 193 lb 12.8 oz (87.9 kg), SpO2 99 %. Gen:      No acute distress HEENT:  EOMI, sclera anicteric Neck:     No masses; no thyromegaly Lungs:    Clear to auscultation bilaterally; normal respiratory effort CV:         Regular rate and rhythm; no murmurs Abd:      + bowel sounds; soft, non-tender; no palpable masses, no distension Ext:    No edema; adequate peripheral perfusion Skin:      Warm and dry; no rash Neuro: alert and oriented x  3 Psych: normal mood and affect   Data Reviewed: Imaging: Chest x-ray 08/01/2019-low lung volumes with bibasal atelectasis, borderline cardiomegaly.  High-res CT 03/29/2020-scattered areas of peripheral groundglass opacity, 8 mm right upper lobe nodule CT chest 11/17/2020-subsegmental atelectasis at the base I have reviewed the images personally.  PFTs: 10/26/2019 FVC 1.52 [75%], FEV1 1.29 [83%], F/F 85, TLC 3.77 [81%], DLCO 16.54 [94%] Normal test  Labs: CBC 04/10/2021-WBC 5.3, eos 3%, absolute eosinophil count 159  Assessment:  Asthma Has presumptive asthma as she has recurrent bronchitis and sensitivity to strong perfumes though there is no obstruction on PFTs States that breathing is stable at present and will continue inhalers as she had a recent hospitalization earlier this year  If she continues to be stable on follow-up visit then consider stopping the inhaler therapy.  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  Follows with Dr. Elsworth Soho  Plan/Recommendations: Continue inhalers  Follow-up in 4 to 6 months  Marshell Garfinkel MD [facility] 06/08/2021, 2:52 PM  CC: [name], MD

## 2021-06-27 ENCOUNTER — Ambulatory Visit
Admission: RE | Admit: 2021-06-27 | Discharge: 2021-06-27 | Disposition: A | Discharge status: Home / Self Care | Payer: Medicare HMO | Source: Ambulatory Visit | Attending: Internal Medicine | Admitting: Internal Medicine

## 2021-06-27 DIAGNOSIS — N281 Cyst of kidney, acquired: Secondary | ICD-10-CM

## 2021-06-30 ENCOUNTER — Telehealth: Payer: Self-pay

## 2021-06-30 NOTE — Chronic Care Management (AMB) (Signed)
° ° °  Called [name], No answer, left message of appointment on 07-04-2021 at 11:00 via telephone visit with [name], Pharm D. Notified to have all medications, supplements, blood pressure and/or blood sugar logs available during appointment and to return call if need to reschedule.    Care Gaps: Shingrix overdue Yearly foot exam overdue Covid booster overdue  Star Rating Drug: Atorvastatin 10 mg- Last filled 03-24-2021 90 DS [employer] Ozempic 1 mg- Last filled 05-25-2021 84 DS [employer]  Any gaps in medications fill history? Yes    [place] Pharmacist Assistant [phone]

## 2021-07-04 ENCOUNTER — Ambulatory Visit (INDEPENDENT_AMBULATORY_CARE_PROVIDER_SITE_OTHER): Payer: Medicare HMO

## 2021-07-04 DIAGNOSIS — E1122 Type 2 diabetes mellitus with diabetic chronic kidney disease: Secondary | ICD-10-CM

## 2021-07-04 DIAGNOSIS — I110 Hypertensive heart disease with heart failure: Secondary | ICD-10-CM

## 2021-07-04 DIAGNOSIS — I25118 Atherosclerotic heart disease of native coronary artery with other forms of angina pectoris: Secondary | ICD-10-CM

## 2021-07-04 NOTE — Progress Notes (Signed)
Chronic Care Management Pharmacy Note  07/12/2021 Name:  [name] MRN:  [id number] DOB:  March 20, 1946  Summary: Patient reports that she is doing well.   Recommendations/Changes made from today's visit: Recommended that patient try to increase the amount of exercise that she is doing. Recommend patient receive Shingrix vaccine.   Plan: Patient is going try to reach a goal of  exercising 150 minutes per week.    Subjective: [name] is an 76 y.o. year old female who is a primary patient of [name], MD.  The CCM team was consulted for assistance with disease management and care coordination needs.    Engaged with patient by telephone for follow up visit in response to provider referral for pharmacy case management and/or care coordination services.   Consent to Services:  The patient was given information about Chronic Care Management services, agreed to services, and gave verbal consent prior to initiation of services.  Please see initial visit note for detailed documentation.   Patient Care Team: [name], MD as PCP - General (Internal Medicine) Leonie Man, MD as PCP - Cardiology (Cardiology) Conni Slipper, NP as Referring Physician (Nurse Practitioner) Arta Silence, MD as Attending Physician (Gastroenterology) Elayne Snare, MD as Attending Physician (Endocrinology) Matilde Bash, MD as Referring Physician (Neurology) Inocencio Homes, DPM as Consulting Physician (Podiatry)  Recent office visits: 06/08/2021 PVP OV 04/10/2021 PCP OV 03/27/2021 PCP OV  Recent consult visits: 06/08/2021 Pulmonology [facility] visits: 03/31/2021 - ED - Hospital Admission    Objective:  Lab Results  Component Value Date   CREATININE 0.85 05/04/2021   BUN 14 05/04/2021   GFR 86.68 05/15/2016   GFRNONAA >60 05/04/2021   GFRAA 75 06/06/2020   NA 138 05/04/2021   K 3.9 05/04/2021   CALCIUM 9.2 05/04/2021   CO2 29 05/04/2021   GLUCOSE 99 05/04/2021     Lab Results  Component Value Date/Time   HGBA1C 7.3 (H) 03/27/2021 03:36 PM   HGBA1C 8.4 (H) 01/09/2021 03:56 PM   FRUCTOSAMINE 290 (H) 05/15/2016 01:35 PM   FRUCTOSAMINE 282 07/01/2015 10:41 AM   GFR 86.68 05/15/2016 01:35 PM   GFR 82.17 04/17/2016 09:55 AM   MICROALBUR 10 06/06/2020 01:23 PM   MICROALBUR 10 01/06/2020 12:16 PM    Last diabetic Eye exam:  Lab Results  Component Value Date/Time   HMDIABEYEEXA No Retinopathy 02/13/2021 12:00 AM    Last diabetic Foot exam: No results found for: HMDIABFOOTEX   Lab Results  Component Value Date   CHOL 145 05/04/2021   HDL 79 05/04/2021   LDLCALC 63 05/04/2021   LDLDIRECT 66.0 01/27/2015   TRIG 16 05/04/2021   CHOLHDL 1.8 05/04/2021    Hepatic Function Latest Ref Rng & Units 04/01/2021 03/31/2021 12/23/2020  Total Protein 6.5 - 8.1 g/dL 6.6 6.6 7.5  Albumin 3.5 - 5.0 g/dL 3.1(L) 3.2(L) 3.5  AST 15 - 41 U/L 17 20 16   ALT 0 - 44 U/L 14 14 13   Alk Phosphatase 38 - 126 U/L 88 102 105  Total Bilirubin 0.3 - 1.2 mg/dL 1.5(H) 0.2(L) 0.6  Bilirubin, Direct 0.0 - 0.2 mg/dL - - -    Lab Results  Component Value Date/Time   TSH 2.010 03/27/2021 03:36 PM   TSH 1.500 11/09/2019 12:21 AM   TSH 1.332 02/25/2019 03:32 AM   TSH 0.768 08/13/2010 08:36 AM    CBC Latest Ref Rng & Units 05/04/2021 04/10/2021 04/03/2021  WBC 4.0 - 10.5 K/uL 4.5 5.3  6.2  Hemoglobin 12.0 - 15.0 g/dL 10.1(L) 9.8(L) 9.7(L)  Hematocrit 36.0 - 46.0 % 33.6(L) 32.1(L) 30.6(L)  Platelets 150 - 400 K/uL 312 352 259    No results found for: VD25OH  Clinical ASCVD: Yes  The 10-year ASCVD risk score (Arnett DK, et al., 2019) is: 21.7%   Values used to calculate the score:     Age: 76 years     Sex: Female     Is Non-Hispanic African American: Yes     Diabetic: Yes     Tobacco smoker: No     Systolic Blood Pressure: 294 mmHg     Is BP treated: Yes     HDL Cholesterol: 79 mg/dL     Total Cholesterol: 145 mg/dL    Depression screen [facility] 2/9 07/06/2021  04/10/2021 01/09/2021  Decreased Interest 1 0 -  Down, Depressed, Hopeless 1 0 -  PHQ - 2 Score 2 0 -  Altered sleeping 3 3 -  Tired, decreased energy 3 1 -  Change in appetite 0 3 -  Feeling bad or failure about yourself  0 0 -  Trouble concentrating 0 3 -  Moving slowly or fidgety/restless 0 0 -  Suicidal thoughts 0 1 -  PHQ-9 Score 8 11 -  Difficult doing work/chores Not difficult at all Somewhat difficult Somewhat difficult  Some recent data might be hidden     Social History   Tobacco Use  Smoking Status Former   Packs/day: 0.50   Years: 25.00   Pack years: 12.50   Types: Cigarettes  Smokeless Tobacco Never  Tobacco Comments   quit smoking 20+ytrs ago   BP Readings from Last 3 Encounters:  06/08/21 138/64  05/04/21 140/80  04/10/21 124/60   Pulse Readings from Last 3 Encounters:  06/08/21 66  05/04/21 64  04/10/21 62   Wt Readings from Last 3 Encounters:  07/06/21 194 lb (88 kg)  06/08/21 193 lb 12.8 oz (87.9 kg)  05/04/21 192 lb 3.2 oz (87.2 kg)   BMI Readings from Last 3 Encounters:  07/06/21 36.66 kg/m  06/08/21 36.62 kg/m  05/04/21 36.32 kg/m    Assessment/Interventions: Review of patient past medical history, allergies, medications, health status, including review of consultants reports, laboratory and other test data, was performed as part of comprehensive evaluation and provision of chronic care management services.   SDOH:  (Social Determinants of Health) assessments and interventions performed: No  SDOH Screenings   Alcohol Screen: Not on file  Depression (PHQ2-9): Medium Risk   PHQ-2 Score: 8  Financial Resource Strain: Low Risk    Difficulty of Paying Living Expenses: Not hard at all  Food Insecurity: No Food Insecurity   Worried About Charity fundraiser in the Last Year: Never true   Ran Out of Food in the Last Year: Never true  Housing: Not on file  Physical Activity: Inactive   Days of Exercise per Week: 0 days   Minutes of  Exercise per Session: 0 min  Social Connections: Not on file  Stress: No Stress Concern Present   Feeling of Stress : Only a little  Tobacco Use: Medium Risk   Smoking Tobacco Use: Former   Smokeless Tobacco Use: Never   Passive Exposure: Not on file  Transportation Needs: No Transportation Needs   Lack of Transportation (Medical): No   Lack of Transportation (Non-Medical): No    CCM Care Plan  Allergies  Allergen Reactions   Other Anaphylaxis and Rash    Bleach  Penicillins Hives    Did it involve swelling of the face/tongue/throat, SOB, or low BP? No Did it involve sudden or severe rash/hives, skin peeling, or any reaction on the inside of your mouth or nose? Yes Did you need to seek medical attention at a hospital or doctor's office? Yes When did it last happen?      Childhood allergy  If all above answers are NO, may proceed with cephalosporin use.     Sulfa Antibiotics Hives   Aspirin Other (See Comments)     On aspirin 81 mg - Rectal bleeding in dec 2018   Codeine     Headache and makes the patient feel "off"    Medications Reviewed Today     Reviewed by [name], LPN (Licensed Practical Nurse) on 07/06/21 at [place] List Status: <None>   Medication Order Taking? Sig Documenting Provider Last Dose Status Informant  acetaminophen (TYLENOL) 500 MG tablet [id number] Yes Take 500-1,000 mg by mouth every 4 (four) hours as needed for moderate pain.  [provider] Taking Active Self  albuterol (VENTOLIN HFA) 108 (90 Base) MCG/ACT inhaler [id number] Yes Inhale 1-2 puffs into the lungs every 6 (six) hours as needed for wheezing or shortness of breath. Marshell Garfinkel, MD Taking Active   Alcohol Swabs (B-D SINGLE USE SWABS REGULAR) PADS [id number] Yes USE TWICE DAILY AS DIRECTED [name], MD Taking Active Self  Ascorbic Acid (VITAMIN C) 1000 MG tablet [id number] Yes Take 1,000 mg by mouth daily. [provider] Taking Active Self  atorvastatin  (LIPITOR) 10 MG tablet [id number] Yes TAKE 1 TABLET EVERY EVENING. [name], MD Taking Active Self  benzonatate (TESSALON PERLES) 100 MG capsule [id number] Yes Take 1 capsule (100 mg total) by mouth 3 (three) times daily as needed for cough. Bary Castilla, NP Taking Active Self           Med Note Olivia Mackie May 04, 2021  9:31 AM)    Blood Glucose Calibration (TRUE METRIX LEVEL 1) Low SOLN [id number] Yes Use as directed  Dx:e11.22 [name], MD Taking Active Self  Blood Glucose Monitoring Suppl (TRUE METRIX METER) w/Device Drucie Opitz [id number] Yes USE AS DIRECTED [name], MD Taking Active   carvedilol (COREG) 12.5 MG tablet [id number] Yes Take 1 tablet (12.5 mg total) by mouth 2 (two) times daily. Larey Dresser, MD Taking Active   dexlansoprazole [facility]) 60 MG capsule [id number] Yes Take 1 capsule (60 mg total) by mouth daily. [name], MD Taking Active            Med Note Gilman Buttner, Juanita Laster   Wed Jan 11, 2021 11:52 AM)    diclofenac sodium (VOLTAREN) 1 % GEL [id number] Yes Apply 2 g topically 4 (four) times daily as needed (pain). Geradine Girt, DO Taking Active Self  ferrous sulfate 324 MG TBEC [id number] Yes Take 324 mg by mouth 2 (two) times daily. [provider] Taking Active Self  furosemide (LASIX) 80 MG tablet [id number] Yes Take 80 mg by mouth daily. [provider] Taking Active   Glucagon (GVOKE HYPOPEN 1-PACK) 0.5 MG/0.1ML SOAJ [id number] Yes Inject 0.5 mg into the skin daily as needed. [name], MD Taking Active Self           Med Note Olivia Mackie May 04, 2021  9:33 AM)    glucose blood (TRUE METRIX BLOOD GLUCOSE TEST) test strip [id number] Yes USE AS DIRECTED TO CHECK BLOOD  SUGARS 2 TIMES PER DAY [name], MD Taking Active Self  guaiFENesin-dextromethorphan [facility] DM) 100-10 MG/5ML syrup [id number] Yes Take 10 mLs by mouth every 6 (six) hours as needed for cough. [place] Eric J, DO Taking Active Self   hydrALAZINE (APRESOLINE) 100 MG tablet [id number] Yes Take 1 tablet (100 mg total) by mouth 3 (three) times daily. Larey Dresser, MD Taking Active   hydrocortisone cream 1 % [id number] Yes Apply 1 application topically daily as needed for itching.  [provider] Taking Active Self  insulin lispro (HUMALOG KWIKPEN) 100 UNIT/ML KwikPen [id number] Yes Inject 0.02-0.04 mLs (2-4 Units total) into the skin 3 (three) times daily as needed (high blood sugar over 150). Bary Castilla, NP Taking Active Self  Insulin Pen Needle (B-D ULTRAFINE III SHORT PEN) 31G X 8 MM MISC [id number] Yes USE DAILY WITH VICTOZA AND/OR NOVOLOG. [name], MD Taking Active   Isopropyl Alcohol (ALCOHOL WIPES) 70 % MISC [id number] Yes Apply 1 each topically 2 (two) times daily. Minette Brine, FNP Taking Active Self  isosorbide mononitrate (IMDUR) 30 MG 24 hr tablet [id number] Yes TAKE 1 TABLET EVERY DAY (APPOINTMENT IS NEEDED FOR REFILLS) Leonie Man, MD Taking Active Self  lamoTRIgine (LAMICTAL) 150 MG tablet [id number] Yes Take 1 tablet (150 mg total) by mouth 2 (two) times daily. [name], MD Taking Active Self  levETIRAcetam (KEPPRA) 500 MG tablet [id number] Yes Take 1,000 mg by mouth at bedtime. [provider] Taking Active   ondansetron (ZOFRAN) 4 MG tablet [id number] Yes Take 4 mg by mouth every 8 (eight) hours as needed for nausea or vomiting. [provider] Taking Active Self           Med Note Olivia Mackie May 04, 2021  9:37 AM)    OZEMPIC, 1 MG/DOSE, 4 MG/3ML SOPN [id number] Yes INJECT 1 MG INTO THE SKIN ONCE A WEEK. [name], MD Taking Active Self           Med Note Gilman Buttner, Juanda Crumble Jan 11, 2021 11:53 AM)    Pancrelipase, Lip-Prot-Amyl, 25000-79000 units CPEP [id number] Yes Take 1-2 capsules by mouth See admin instructions. Take 1 capsules in the morning with breakfast, 1 capsule with lunch, and 2 capsules with supper [provider] Taking  Active Self           Med Note Stanford [place]   Wed Jan 11, 2021 11:55 AM)    potassium chloride SA (KLOR-CON) 20 MEQ tablet [id number] Yes TAKE 2 TABLETS EVERY DAY BUT WHEN TAKING METOLAZONE TAKE 3 TABLETS DAILY AS DIRECTED Leonie Man, MD Taking Active            Med Note Gilman Buttner, Juanita Laster   Wed Jan 11, 2021 11:54 AM)    sennosides-docusate sodium (SENOKOT-S) 8.6-50 MG tablet [id number] Yes Take 1-2 tablets by mouth at bedtime as needed for constipation. [provider] Taking Active Self  sertraline (ZOLOFT) 50 MG tablet [id number] Yes TAKE 1 TABLET EVERY DAY [name], MD Taking Active Self           Med Note Clydene Laming Jan 11, 2021 11:54 AM)    tiotropium (SPIRIVA HANDIHALER) 18 MCG inhalation capsule [id number] Yes Place 1 capsule (18 mcg total) into inhaler and inhale daily. [place] Donnamarie Poag, DO Taking Active Self  tolnaftate (TINACTIN) 1 % cream [id number] Yes Apply 1 application topically daily as needed (foot fungus).  [provider] Taking  Active Self  TOUJEO SOLOSTAR 300 UNIT/ML Solostar Pen [id number] Yes INJECT 36 UNITS INTO THE SKIN AT BEDTIME. [name], MD Taking Active   TRUEplus Lancets 33G [place] [id number] Yes Use as directed to check blood sugars 2 times per day dx: e11.22 [name], MD Taking Active Self            Patient Active Problem List   Diagnosis Date Noted   Acute on chronic blood loss anemia 04/01/2021   Hypokalemia 04/01/2021   SOB (shortness of breath) 11/14/2020   Postinflammatory pulmonary fibrosis ([place]) 06/08/2020   Lung nodule 06/08/2020   Chronic kidney disease, stage 2 (mild) 01/05/2020   Hypertensive chronic kidney disease with stage 1 through stage 4 chronic kidney disease, or unspecified chronic kidney disease 01/05/2020   Snoring 01/05/2020   Neuroleptic-induced parkinsonism ([place]) 09/28/2019   Class 3 severe obesity due to excess calories with serious comorbidity and body mass index (BMI)  of 40.0 to 44.9 in adult ([place]) 05/22/2019   Pulmonary hypertension, unspecified ([place]) 04/06/2019   Syncope 02/24/2019   Rheumatoid arthritis ([place]) 01/21/2019   Radial styloid tenosynovitis 11/24/2018   Type 2 diabetes mellitus with stage 2 chronic kidney disease, with long-term current use of insulin ([place]) 07/21/2018   Chest pain 05/15/2018   Excessive daytime sleepiness 01/30/2018   Sleep-related hypoventilation 01/30/2018   Recurrent depression ([place]) 11/06/2017   OSA on CPAP 10/07/2017   Todd's paralysis ([place])    Seizure ([place]) 06/17/2017   Chronic diastolic CHF (congestive heart failure), NYHA class 2 (HCC)    Chronic respiratory failure with hypoxia (HCC)    Acute lower GI bleeding    Stroke-like symptoms 06/15/2017   Rectal bleeding 06/15/2017   Dehydration 03/07/2017   Medication management 12/24/2016   Near syncope 03/09/2016   Racing heart beat 03/09/2016   Dyspnea 01/28/2016   Spinal stenosis at L4-L5 level 10/04/2014   Parkinsonism ([place]) 06/24/2013   Lower abdominal pain 10/31/2012   DM2 (diabetes mellitus, type 2) ([place]) 10/31/2012   Seizure disorder ([place]) 10/31/2012   Diverticulosis 10/31/2012   Anemia associated with acute blood loss 08/14/2012   Hematochezia 08/14/2012   Varicose veins of bilateral lower extremities with other complications 92/42/6834   Leg pain 03/21/2012   Radicular pain of left lower extremity 03/21/2012   Heart murmur 11/21/2011   Infected pseudocyst of pancreas 10/28/2011   Chronic pancreatitis ([place]) 10/28/2011   Hypertensive heart disease with chronic diastolic congestive heart failure ([place]) 10/28/2011   Dyslipidemia, goal LDL below 100 10/28/2011   Morbid obesity with BMI of 45.0-49.9, adult ([place]) 10/28/2011   Pseudoseizure 10/24/2011   Gastroesophageal reflux 10/23/2011   History of tobacco abuse 10/23/2011   Hypoxemia 10/23/2011   Disturbance in sleep behavior 09/03/2011   Partial epilepsy with impairment of consciousness ([place])  09/03/2011   Tremor 09/03/2011   Peripheral edema 04/14/2011   Obesity hypoventilation syndrome ([place]) 10/26/2010   Atherosclerotic heart disease of native coronary artery with other forms of angina pectoris ([place]) 08/28/2010   Acute, but ill-defined, cerebrovascular disease 05/04/2008   Arthropathy 05/04/2008   Disequilibrium 05/04/2008   Generalized tonic clonic epilepsy ([place]) 05/04/2008   Increased frequency of urination 05/04/2008   Insomnia, unspecified 05/04/2008   Major depressive disorder, single episode, unspecified 05/04/2008   Memory loss 05/04/2008   Nausea 05/04/2008   Other malaise and fatigue 05/04/2008   Swelling of limb 05/04/2008    Immunization History  Administered Date(s) Administered   Fluad Quad(high Dose 65+) 06/06/2020, 03/27/2021   Influenza  Split 04/11/2011, 06/30/2013   Influenza, High Dose Seasonal PF 02/14/2017, 04/24/2018   Influenza-Unspecified 04/07/2014   PFIZER(Purple Top)SARS-COV-2 Vaccination 08/06/2019, 08/31/2019, 05/04/2020, 11/08/2020   PNEUMOCOCCAL CONJUGATE-20 11/08/2020   Pfizer Covid-19 Vaccine Bivalent Booster 3yrs & up 07/07/2021   Pneumococcal Polysaccharide-23 06/25/2010   Tdap 05/18/2012   Zoster Recombinat (Shingrix) 07/07/2021   Zoster, Live 04/14/2014    Conditions to be addressed/monitored:  Hypertension and Hyperlipidemia  Care Plan : [place]  Updates made by Mayford Knife, [place] since 07/12/2021 12:00 AM     Problem: DM, HTN, ATHEROSCLEROSIS OF AORTA      Long-Range Goal: Disease Management   Note:   Current Barriers:  Unable to independently monitor therapeutic efficacy  Pharmacist Clinical Goal(s):  Patient will achieve adherence to monitoring guidelines and medication adherence to achieve therapeutic efficacy through collaboration with PharmD and provider.   Interventions: 1:1 collaboration with [name], MD regarding development and update of comprehensive plan of care as evidenced by  provider attestation and co-signature Inter-disciplinary care team collaboration (see longitudinal plan of care) Comprehensive medication review performed; medication list updated in electronic medical record  Hypertension (BP goal <130/80) -Controlled -Current treatment: Isosorbide  mononitrate 20 mg tablet once per day Appropriate, Effective, Safe, Accessible Hydralazine 100 mg tablet three times per day Appropriate, Effective, Safe, Accessible Carvedilol 12.5 mg taking 1 tablet by mouth twice per day Appropriate, Effective, Safe, Accessible -Current home readings: 06/28/2021: 154/76, 1/6 -152/60, 1/7-151/72, 1/9-147/68, 128/76 pulse with in range -Current dietary habits: she nor her husband cook with salt  at this time  -Current exercise habits: she is walking through the house from her room, to all the other rooms includings the living room  -Denies hypotensive/hypertensive symptoms -Educated on Daily salt intake goal < 2300 mg; Exercise goal of 150 minutes per week; -Counseled to monitor BP at home at least three times per week, document, and provide log at future appointments -Recommended to continue current medication  Atherosclerosis of the Aorta: (LDL goal < 70) -Controlled -Current treatment: Atorvastatin 10 mg tablet once per day. Appropriate, Effective, Safe, Accessible Furosemide 80 mg tablet daily Appropriate, Effective, Safe, Accessible Isosorbide mononitrate 30 mg tablet daily Appropriate, Effective, Safe, Accessible Hydralazine 100 mg tablet three times daily Appropriate, Effective, Safe, Accessible Carvedilol 12.5 mg tablet twice per day Appropriate, Effective, Safe, Accessible -Current dietary patterns: in the morning she eating a fruit, for lunch she is eating what they had for dinner, lettuce, and tomato, her sister usually cooks a meal with a toss salad, and or cabbage or collard greens  -Current exercise habits:  -Educated on Cholesterol goals;  Benefits of statin  for ASCVD risk reduction; Exercise goal of 150 minutes per week; -Counseled on diet and exercise extensively Recommended to continue current medication  Diabetes (A1c goal <7%) -Not ideally controlled -Current medications: Ozempic 1 mg - inject 1 mg into the skin once a week Appropriate, Effective, Safe, Accessible Humalog Kwik Pen- inject 2-4 UNITS Into the skin three times daily as needed for high blood sugar over 150  Appropriate, Effective, Safe, Accessible Toujeo solostar 300 unit/ml - inject 36 units into the skin at bedtime Currently injecting 25 units at bedtime  Appropriate, Effective, Safe, Accessible -Current home glucose readings fasting glucose: 111, 120, 99  post prandial glucose: 146, 136 -Denies hypoglycemic/hyperglycemic symptoms -Current meal patterns:  breakfast: patient reports that she rarely eats breakfast   dinner: vegetables and protein   snacks: patient sometimes  drinks: drinking plenty of water  -  Current exercise: patient reports walking around the house  -Educated on Prevention and management of hypoglycemic episodes; -Counseled to check feet daily and get yearly eye exams -Congratulated patient on her decrease in A1c from 8.4 to 7.3 -Counseled on diet and exercise extensively Recommended to continue current medication  Health Maintenance -Vaccine gaps: COVID-19 Booster, Shingrix vaccine -Educated on the importance of COVID -19 booster, and the Shingles vaccine -Patient is going to sign up for the booster shot.  -Collaborated with patient and she is going to receive both vaccines.    Patient Goals/Self-Care Activities Patient will:  - take medications as prescribed as evidenced by patient report and record review  Follow Up Plan: The patient has been provided with contact information for the care management team and has been advised to call with any health related questions or concerns.       Medication Assistance: None required.  Patient affirms  current coverage meets needs.  Compliance/Adherence/Medication fill history: Care Gaps: Shingrix Vaccine Foot Exam COVID-19 Vaccine  Star-Rating Drugs: Atorvastatin 10 mg  Ozempic 1 mg once per week  Patient's preferred pharmacy is:  [facility], [place] [address] Phone: [phone] Fax: [phone]  [facility] [address], [address] [place] [address] Phone: [phone] Fax: [phone]  Uses pill box? Yes Pt endorses 85% compliance  We discussed: Benefits of medication synchronization, packaging and delivery as well as enhanced pharmacist oversight with [facility]. Patient decided to: Continue current medication management strategy  Care Plan and Follow Up Patient Decision:  Patient agrees to Care Plan and Follow-up.  Plan: The patient has been provided with contact information for the care management team and has been advised to call with any health related questions or concerns.   [name], CPP, PharmD Clinical Pharmacist Practitioner [facility] [phone]

## 2021-07-06 ENCOUNTER — Other Ambulatory Visit: Payer: Self-pay

## 2021-07-06 ENCOUNTER — Ambulatory Visit (INDEPENDENT_AMBULATORY_CARE_PROVIDER_SITE_OTHER): Payer: Medicare HMO

## 2021-07-06 VITALS — Ht 61.0 in | Wt 194.0 lb

## 2021-07-06 DIAGNOSIS — Z0000 Encounter for general adult medical examination without abnormal findings: Secondary | ICD-10-CM | POA: Diagnosis not present

## 2021-07-06 DIAGNOSIS — R059 Cough, unspecified: Secondary | ICD-10-CM

## 2021-07-06 NOTE — Patient Instructions (Signed)
[name] , Thank you for taking time to come for your Medicare Wellness Visit. I appreciate your ongoing commitment to your health goals. Please review the following plan we discussed and let me know if I can assist you in the future.   Screening recommendations/referrals: Colonoscopy: completed 04/03/2021, due 04/03/2026 Mammogram: completed 07/21/2020 Bone Density: completed 03/30/2013 Recommended yearly ophthalmology/optometry visit for glaucoma screening and checkup Recommended yearly dental visit for hygiene and checkup  Vaccinations: Influenza vaccine: completed 03/27/2021 Pneumococcal vaccine: completed 11/08/2020 Tdap vaccine: completed 05/18/2012, due 05/18/2022 Shingles vaccine:  discussed   Covid-19: 11/08/2020, 05/04/2020, 08/31/2019, 08/06/2019  Advanced directives: copy in chart  Conditions/risks identified: none  Next appointment: Follow up in one year for your annual wellness visit    Preventive Care 40 Years and Older, Female Preventive care refers to lifestyle choices and visits with your health care provider that can promote health and wellness. What does preventive care include? A yearly physical exam. This is also called an annual well check. Dental exams once or twice a year. Routine eye exams. Ask your health care provider how often you should have your eyes checked. Personal lifestyle choices, including: Daily care of your teeth and gums. Regular physical activity. Eating a healthy diet. Avoiding tobacco and drug use. Limiting alcohol use. Practicing safe sex. Taking low-dose aspirin every day. Taking vitamin and mineral supplements as recommended by your health care provider. What happens during an annual well check? The services and screenings done by your health care provider during your annual well check will depend on your age, overall health, lifestyle risk factors, and family history of disease. Counseling  Your health care provider may ask you  questions about your: Alcohol use. Tobacco use. Drug use. Emotional well-being. Home and relationship well-being. Sexual activity. Eating habits. History of falls. Memory and ability to understand (cognition). Work and work Statistician. Reproductive health. Screening  You may have the following tests or measurements: Height, weight, and BMI. Blood pressure. Lipid and cholesterol levels. These may be checked every 5 years, or more frequently if you are over 33 years old. Skin check. Lung cancer screening. You may have this screening every year starting at age 40 if you have a 30-pack-year history of smoking and currently smoke or have quit within the past 15 years. Fecal occult blood test (FOBT) of the stool. You may have this test every year starting at age 70. Flexible sigmoidoscopy or colonoscopy. You may have a sigmoidoscopy every 5 years or a colonoscopy every 10 years starting at age 72. Hepatitis C blood test. Hepatitis B blood test. Sexually transmitted disease (STD) testing. Diabetes screening. This is done by checking your blood sugar (glucose) after you have not eaten for a while (fasting). You may have this done every 1-3 years. Bone density scan. This is done to screen for osteoporosis. You may have this done starting at age 35. Mammogram. This may be done every 1-2 years. Talk to your health care provider about how often you should have regular mammograms. Talk with your health care provider about your test results, treatment options, and if necessary, the need for more tests. Vaccines  Your health care provider may recommend certain vaccines, such as: Influenza vaccine. This is recommended every year. Tetanus, diphtheria, and acellular pertussis (Tdap, Td) vaccine. You may need a Td booster every 10 years. Zoster vaccine. You may need this after age 72. Pneumococcal 13-valent conjugate (PCV13) vaccine. One dose is recommended after age 95. Pneumococcal polysaccharide  (PPSV23) vaccine. One  dose is recommended after age 9. Talk to your health care provider about which screenings and vaccines you need and how often you need them. This information is not intended to replace advice given to you by your health care provider. Make sure you discuss any questions you have with your health care provider. Document Released: 07/08/2015 Document Revised: 02/29/2016 Document Reviewed: 04/12/2015 Elsevier Interactive Patient Education  2017 [place] Prevention in the Home Falls can cause injuries. They can happen to people of all ages. There are many things you can do to make your home safe and to help prevent falls. What can I do on the outside of my home? Regularly fix the edges of walkways and driveways and fix any cracks. Remove anything that might make you trip as you walk through a door, such as a raised step or threshold. Trim any bushes or trees on the path to your home. Use bright outdoor lighting. Clear any walking paths of anything that might make someone trip, such as rocks or tools. Regularly check to see if handrails are loose or broken. Make sure that both sides of any steps have handrails. Any raised decks and porches should have guardrails on the edges. Have any leaves, snow, or ice cleared regularly. Use sand or salt on walking paths during winter. Clean up any spills in your garage right away. This includes oil or grease spills. What can I do in the bathroom? Use night lights. Install grab bars by the toilet and in the tub and shower. Do not use towel bars as grab bars. Use non-skid mats or decals in the tub or shower. If you need to sit down in the shower, use a plastic, non-slip stool. Keep the floor dry. Clean up any water that spills on the floor as soon as it happens. Remove soap buildup in the tub or shower regularly. Attach bath mats securely with double-sided non-slip rug tape. Do not have throw rugs and other things on the  floor that can make you trip. What can I do in the bedroom? Use night lights. Make sure that you have a light by your bed that is easy to reach. Do not use any sheets or blankets that are too big for your bed. They should not hang down onto the floor. Have a firm chair that has side arms. You can use this for support while you get dressed. Do not have throw rugs and other things on the floor that can make you trip. What can I do in the kitchen? Clean up any spills right away. Avoid walking on wet floors. Keep items that you use a lot in easy-to-reach places. If you need to reach something above you, use a strong step stool that has a grab bar. Keep electrical cords out of the way. Do not use floor polish or wax that makes floors slippery. If you must use wax, use non-skid floor wax. Do not have throw rugs and other things on the floor that can make you trip. What can I do with my stairs? Do not leave any items on the stairs. Make sure that there are handrails on both sides of the stairs and use them. Fix handrails that are broken or loose. Make sure that handrails are as long as the stairways. Check any carpeting to make sure that it is firmly attached to the stairs. Fix any carpet that is loose or worn. Avoid having throw rugs at the top or bottom of the stairs.  If you do have throw rugs, attach them to the floor with carpet tape. Make sure that you have a light switch at the top of the stairs and the bottom of the stairs. If you do not have them, ask someone to add them for you. What else can I do to help prevent falls? Wear shoes that: Do not have high heels. Have rubber bottoms. Are comfortable and fit you well. Are closed at the toe. Do not wear sandals. If you use a stepladder: Make sure that it is fully opened. Do not climb a closed stepladder. Make sure that both sides of the stepladder are locked into place. Ask someone to hold it for you, if possible. Clearly mark and make  sure that you can see: Any grab bars or handrails. First and last steps. Where the edge of each step is. Use tools that help you move around (mobility aids) if they are needed. These include: Canes. Walkers. Scooters. Crutches. Turn on the lights when you go into a dark area. Replace any light bulbs as soon as they burn out. Set up your furniture so you have a clear path. Avoid moving your furniture around. If any of your floors are uneven, fix them. If there are any pets around you, be aware of where they are. Review your medicines with your doctor. Some medicines can make you feel dizzy. This can increase your chance of falling. Ask your doctor what other things that you can do to help prevent falls. This information is not intended to replace advice given to you by your health care provider. Make sure you discuss any questions you have with your health care provider. Document Released: 04/07/2009 Document Revised: 11/17/2015 Document Reviewed: 07/16/2014 Elsevier Interactive Patient Education  2017 [employer].

## 2021-07-06 NOTE — Progress Notes (Signed)
I connected with [name] today by telephone and verified that I am speaking with the correct person using two identifiers. Location patient: home Location provider: work Persons participating in the virtual visit: [name], [name] Durand LPN.   I discussed the limitations, risks, security and privacy concerns of performing an evaluation and management service by telephone and the availability of in person appointments. I also discussed with the patient that there may be a patient responsible charge related to this service. The patient expressed understanding and verbally consented to this telephonic visit.    Interactive audio and video telecommunications were attempted between this provider and patient, however failed, due to patient having technical difficulties OR patient did not have access to video capability.  We continued and completed visit with audio only.     Vital signs may be patient reported or missing.  Subjective:   [name] is a 76 y.o. female who presents for Medicare Annual (Subsequent) preventive examination.  Review of Systems     Cardiac Risk Factors include: advanced age (>61mn, >>28women);diabetes mellitus;hypertension;obesity (BMI >30kg/m2)     Objective:    Today's Vitals   07/06/21 1456  Weight: 194 lb (88 kg)  Height: _0  (1.549 m)  PainSc: 9    Body mass index is 36.66 kg/m.  Advanced Directives 07/06/2021 03/31/2021 12/23/2020 11/14/2020 08/11/2020 01/06/2020 11/08/2019  Does Patient Have a Medical Advance Directive? _1  Yes No  Type of AParamedicof AMontebelloLiving will Living will;Healthcare Power of AMentoneOut of facility DNR (pink MOST or yellow form) HHuntington ParkLiving will Living will;Healthcare Power of [place]Living will H[place]priseLiving will -  Does patient want to make changes to medical advance directive? - - - No -  Patient declined - - -  Copy of HM[place]in Chart? Yes - validated most recent copy scanned in chart (See row information) - - No - copy requested Yes - validated most recent copy scanned in chart (See row information) Yes - validated most recent copy scanned in chart (See row information) -  Would patient like information on creating a medical advance directive? - - - - - - No - Patient declined  Pre-existing out of facility DNR order (yellow form or pink MOST form) - (No Data) - - - - -    Current Medications (verified) Outpatient Encounter Medications as of 07/06/2021  Medication Sig   acetaminophen (TYLENOL) 500 MG tablet Take 500-1,000 mg by mouth every 4 (four) hours as needed for moderate pain.    albuterol (VENTOLIN HFA) 108 (90 Base) MCG/ACT inhaler Inhale 1-2 puffs into the lungs every 6 (six) hours as needed for wheezing or shortness of breath.   Alcohol Swabs (B-D SINGLE USE SWABS REGULAR) PADS USE TWICE DAILY AS DIRECTED   Ascorbic Acid (VITAMIN C) 1000 MG tablet Take 1,000 mg by mouth daily.   atorvastatin (LIPITOR) 10 MG tablet TAKE 1 TABLET EVERY EVENING.   benzonatate (TESSALON PERLES) 100 MG capsule Take 1 capsule (100 mg total) by mouth 3 (three) times daily as needed for cough.   Blood Glucose Calibration (TRUE METRIX LEVEL 1) Low SOLN Use as directed  Dx:e11.22   Blood Glucose Monitoring Suppl (TRUE METRIX METER) w/Device KIT USE AS DIRECTED   carvedilol (COREG) 12.5 MG tablet Take 1 tablet (12.5 mg total) by mouth 2 (two) times daily.   dexlansoprazole (DEXILANT) 60 MG capsule Take 1 capsule (  60 mg total) by mouth daily.   diclofenac sodium (VOLTAREN) 1 % GEL Apply 2 g topically 4 (four) times daily as needed (pain).   ferrous sulfate 324 MG TBEC Take 324 mg by mouth 2 (two) times daily.   furosemide (LASIX) 80 MG tablet Take 80 mg by mouth daily.   Glucagon (GVOKE HYPOPEN 1-PACK) 0.5 MG/0.1ML SOAJ Inject 0.5 mg into the skin daily as needed.   glucose  blood (TRUE METRIX BLOOD GLUCOSE TEST) test strip USE AS DIRECTED TO CHECK BLOOD SUGARS 2 TIMES PER DAY   guaiFENesin-dextromethorphan (ROBITUSSIN DM) 100-10 MG/5ML syrup Take 10 mLs by mouth every 6 (six) hours as needed for cough.   hydrALAZINE (APRESOLINE) 100 MG tablet Take 1 tablet (100 mg total) by mouth 3 (three) times daily.   hydrocortisone cream 1 % Apply 1 application topically daily as needed for itching.    insulin lispro (HUMALOG KWIKPEN) 100 UNIT/ML KwikPen Inject 0.02-0.04 mLs (2-4 Units total) into the skin 3 (three) times daily as needed (high blood sugar over 150).   Insulin Pen Needle (B-D ULTRAFINE III SHORT PEN) 31G X 8 MM MISC USE DAILY WITH VICTOZA AND/OR NOVOLOG.   Isopropyl Alcohol (ALCOHOL WIPES) 70 % MISC Apply 1 each topically 2 (two) times daily.   isosorbide mononitrate (IMDUR) 30 MG 24 hr tablet TAKE 1 TABLET EVERY DAY (APPOINTMENT IS NEEDED FOR REFILLS)   lamoTRIgine (LAMICTAL) 150 MG tablet Take 1 tablet (150 mg total) by mouth 2 (two) times daily.   levETIRAcetam (KEPPRA) 500 MG tablet Take 1,000 mg by mouth at bedtime.   ondansetron (ZOFRAN) 4 MG tablet Take 4 mg by mouth every 8 (eight) hours as needed for nausea or vomiting.   OZEMPIC, 1 MG/DOSE, 4 MG/3ML SOPN INJECT 1 MG INTO THE SKIN ONCE A WEEK.   Pancrelipase, Lip-Prot-Amyl, 25000-79000 units CPEP Take 1-2 capsules by mouth See admin instructions. Take 1 capsules in the morning with breakfast, 1 capsule with lunch, and 2 capsules with supper   potassium chloride SA (KLOR-CON) 20 MEQ tablet TAKE 2 TABLETS EVERY DAY BUT WHEN TAKING METOLAZONE TAKE 3 TABLETS DAILY AS DIRECTED   sennosides-docusate sodium (SENOKOT-S) 8.6-50 MG tablet Take 1-2 tablets by mouth at bedtime as needed for constipation.   sertraline (ZOLOFT) 50 MG tablet TAKE 1 TABLET EVERY DAY   tiotropium (SPIRIVA HANDIHALER) 18 MCG inhalation capsule Place 1 capsule (18 mcg total) into inhaler and inhale daily.   tolnaftate (TINACTIN) 1 % cream  Apply 1 application topically daily as needed (foot fungus).    TOUJEO SOLOSTAR 300 UNIT/ML Solostar Pen INJECT 36 UNITS INTO THE SKIN AT BEDTIME.   TRUEplus Lancets 33G MISC Use as directed to check blood sugars 2 times per day dx: e11.22   No facility-administered encounter medications on file as of 07/06/2021.    Allergies (verified) Other, Penicillins, Sulfa antibiotics, Aspirin, and Codeine   History: Past Medical History:  Diagnosis Date   Abnormal liver function     in the past.   Anemia    Arthritis    all over   Bruises easily    Cataract    right eye;immature   CHF (congestive heart failure) (HCC)    Chronic back pain    stenosis   Chronic cough    Chronic kidney disease    COPD (chronic obstructive pulmonary disease) ([place])    COVID    aug/sept 2021   Demand myocardial infarction [facility]) 2012   Demand Infarction in setting of Pancreatitis -->  mild Troponin elevation, NON-OBSTRUCTIVE CAD   Depression    takes Abilify daily as well as Zoloft   Diastolic heart failure 0981   Grade 1 diastolic Dysfunction by Echo    Diverticulosis    DM (diabetes mellitus) ([place])    takes Victoza daily as well as Lantus and Humalog   Empty sella ([place])    on MRI in 2009.   Glaucoma    Headache(784.0)    last migraine-4-34yr ago   History of blood transfusion    no abnormal reaction noted   History of colon polyps    benign   HTN (hypertension)    takes Benicar,Imdur,and Bystolic daily   Hyperlipidemia    takes Lipitor daily   Joint swelling    Nocturia    Obesity hypoventilation syndrome (HCastaic    Obstructive sleep apnea 02/2018   Notably improved split-night study with weight loss from 270 (2017) down to 250 pounds (2019).   Pancreatitis    takes Pancrelipase daily   Parkinson's disease ([place]common    takes Sinemet daily   Peripheral neuropathy    Pneumonia 2012   Seizures ([place]    takes Lamictal daily and Primidone nightly;last seizure 2wks ago   Urinary urgency    With  increased frequency   Varicose veins of both lower extremities with pain    With edema.  Takes daily Lasix   Past Surgical History:  Procedure Laterality Date   ABDOMINAL HYSTERECTOMY     APPENDECTOMY     BACK SURGERY     Cardiac Event Monitor  September-October 2017   Sinus rhythm with occasional PACs and artifact. No arrhythmias besides one short run of tachycardia.   CHOLECYSTECTOMY     COLONOSCOPY N/A 08/15/2012   Procedure: COLONOSCOPY;  Surgeon: WArta Silence MD;  Location: [facility];  Service: Endoscopy;  Laterality: N/A;   COLONOSCOPY WITH PROPOFOL Left 06/17/2017   Procedure: COLONOSCOPY WITH PROPOFOL;  Surgeon: KRonnette Juniper MD;  [place]: [facility]  Service: Gastroenterology;  Laterality: Left;   COLONOSCOPY WITH PROPOFOL N/A 04/03/2021   Procedure: COLONOSCOPY WITH PROPOFOL;  Surgeon: MClarene Essex MD;  Location: [facility]  Service: Endoscopy;  Laterality: N/A;   CORONARY CALCIUM SCORE AND CTA  04/2018   Coronary calcium score 71.9.  Very large, hyperdynamic LAD wrapping apex giving rise to PDA.  Moderate LAD-diagonal and circumflex plaque -> CT FFR suggested positive findings in distal D1, D2 and distal circumflex.  Referred for cath. ==> FALSE POSITIVE   ESOPHAGOGASTRODUODENOSCOPY     eye cysts Bilateral    LASIK     LEFT HEART CATH AND CORONARY ANGIOGRAPHY N/A 05/16/2018   Procedure: LEFT HEART CATH AND CORONARY ANGIOGRAPHY;  Surgeon: SBelva Crome MD;  Location: [facility] INVASIVE CV LAB:  Angiographically normal coronary arteries with LVEDP of 21 mmHg.  Large draping hyperdominant LAD that wraps the apex and provides distal half of the PDA.  Relatively small caliber distal Cx -->proxLPDA, non-dom RCA. No Cx or Diag lesions -- FALSE + CT FFR   LEFT HEART CATH AND CORONARY ANGIOGRAPHY  2009/March 2012   2009: (Dr. KAlvie Heidelberg Nonobstructive CAD; 2012: Minimal CAD --> false positive stress test   [facility]  01/2016; 01/2018   a)LOW RISK. No ischemia or infarction;; b)  EF 55-60%. NO ST changes.  No ischemia or Infarction. NO significant RV enlargement.  LOW RISK.   POLYPECTOMY  04/03/2021   Procedure: POLYPECTOMY;  Surgeon: MClarene Essex MD;  [facility];  Service: Endoscopy;;  Polysomnogram  02/2018   (Dr. Brett Fairy from neurology): Split study was not adequately done due to low AHI.  She slept reclined and had an AHI less than 10, prolonged hypoxemia and no hypercapnia. -->  She has home oxygen already prescribed, but did not meet criteria for nightly oxygen.  (Was noted that the patient did not cooperate well with study.  Titration was discussed.   PRESSURE SENSOR/CARDIOMEMS N/A 11/09/2019   Procedure: PRESSURE SENSOR/CARDIOMEMS;  Surgeon: Larey Dresser, MD;  Location: [place] CV LAB;  Service: Cardiovascular;  Laterality: N/A;   RECTAL POLYPECTOMY     RIGHT/LEFT HEART CATH AND CORONARY ANGIOGRAPHY N/A 04/06/2019   Procedure: RIGHT/LEFT HEART CATH AND CORONARY ANGIOGRAPHY;  Surgeon: Leonie Man, MD;  Location: [facility] INVASIVE CV LAB; angiographically normal coronary arteries.  Moderately elevated AD LVEDP..  Mild pulmonary hypertension: PA P 56/14 mmHg-mean 31 mmHg.  RAP 10 mmHg.  RV P-EDP 54/4 mmHg - 12 mmHg.  PCWP 11-15 mmHg.  CO-CI: 7.35-3.73.   TONSILLECTOMY     TRANSTHORACIC ECHOCARDIOGRAM  01/2016; 05/2017   A) Normal LV chamber size with mod LVH pattern. EF 50-55%. Severe LA dilation. Mod RA dilation. PAP elevated at 38 mmHg;; B) Mild concentric LVH.  EF 55-60% & no RWMA.  Grade 2 DD.  Diastolic flattening of the ventricular septum == ? elevated PAP.  Mild aortic valve calcification/sclerosis.  Mod LA dilation.  Mild RV dilation & Mod RA dilation   TRANSTHORACIC ECHOCARDIOGRAM  10/2017; 01/2018   a) EF 55-60%.  No RWMA,  Moderate LA dilation.  Mild LA dilation.  Peak PA pressures ~57 mmHg (moderate);;; b) EF 55-60%.  GRII DD.  Suggestion of RV volume overload with moderate RV dilation.  Severe LA and RA dilation.Marland Kitchen  PA pressure ~67%.    TRANSTHORACIC ECHOCARDIOGRAM  01/2019   Normal LV size and function.  EF 50 to 55%.  Impaired relaxation.  RV appears to have moderately reduced function.  Severely enlarged.  Cannot fully assess RV pressures.  Hypermobile interatrial septum.  Right atrium severely dilated.   TUBAL LIGATION     vaginal cyst removed     several times   Family History  Problem Relation Age of Onset   Allergies Father    Heart disease Father        before 38   Heart failure Father    Hypertension Father    Hyperlipidemia Father    Heart disease Mother    Diabetes Mother    Hypertension Mother    Hyperlipidemia Mother    Hypertension Sister    Heart disease Sister        before 47   Hyperlipidemia Sister    Diabetes Brother    Hypertension Brother    Diabetes Sister    Hypertension Sister    Diabetes Son    Hypertension Son    Cancer Other    Social History   Socioeconomic History   Marital status: Divorced    Spouse name: Not on file   Number of children: Not on file   Years of education: Not on file   Highest education level: Not on file  Occupational History   Occupation: Disabled    Comment: CNA  Tobacco Use   Smoking status: Former    Packs/day: 0.50    Years: 25.00    Pack years: 12.50    Types: Cigarettes   Smokeless tobacco: Never   Tobacco comments:    quit smoking 20+ytrs ago  Media planner  Vaping Use: Never used  Substance and Sexual Activity   Alcohol use: No   Drug use: No   Sexual activity: Not Currently    Birth control/protection: Surgical  Other Topics Concern   Not on file  Social History Narrative   She lives in [place]. She has lots of family in the area and is accompanied by her sister.   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   She has a distant history of smoking, quit 20 years ago.   Social Determinants of Health   Financial Resource Strain: Low Risk    Difficulty of Paying Living Expenses: Not hard at all   Food Insecurity: No Food Insecurity   Worried About Charity fundraiser in the Last Year: Never true   [place] in the Last Year: Never true  Transportation Needs: No Transportation Needs   Lack of Transportation (Medical): No   Lack of Transportation (Non-Medical): No  Physical Activity: Inactive   Days of Exercise per Week: 0 days   Minutes of Exercise per Session: 0 min  Stress: No Stress Concern Present   Feeling of Stress : Only a little  Social Connections: Not on file    Tobacco Counseling Counseling given: Not Answered Tobacco comments: quit smoking 20+ytrs ago   Clinical Intake:  Pre-visit preparation completed: Yes  Pain : 0-10 Pain Score: 9  Pain Type: Chronic pain Pain Location: Foot Pain Orientation: Right, Left Pain Descriptors / Indicators: Sharp Pain Onset: More than a month ago Pain Frequency: Intermittent     Nutritional Status: BMI > 30  Obese Nutritional Risks: None Diabetes: Yes  How often do you need to have someone help you when you read instructions, pamphlets, or other written materials from your doctor or pharmacy?: 1 - Never What is the last grade level you completed in school?: some college  Diabetic? Yes Nutrition Risk Assessment:  Has the patient had any N/V/D within the last 2 months?  No  Does the patient have any non-healing wounds?  No  Has the patient had any unintentional weight loss or weight gain?  No   Diabetes:  Is the patient diabetic?  Yes  If diabetic, was a CBG obtained today?  No  Did the patient bring in their glucometer from home?  No  How often do you monitor your CBG's? 2-3 daily.   Financial Strains and Diabetes Management:  Are you having any financial strains with the device, your supplies or your medication? No .  Does the patient want to be seen by Chronic Care Management for management of their diabetes?  No  Would the patient like to be referred to a Nutritionist or for Diabetic Management?  No    Diabetic Exams:  Diabetic Eye Exam: Completed 02/13/2021 Diabetic Foot Exam: Overdue, Pt has been advised about the importance in completing this exam. Pt is scheduled for diabetic foot exam on next appointment.   Interpreter Needed?: No  Information entered by :: NAllen LPN   Activities of Daily Living In your present state of health, do you have any difficulty performing the following activities: 07/06/2021 11/14/2020  Hearing? Y N  Comment sometimes -  Vision? Y N  Comment sometimes -  Difficulty concentrating or making decisions? N N  Walking or climbing stairs? Y N  Dressing or bathing? N N  Doing errands, shopping? N Y  Comment - -  Conservation officer, nature and eating ? N -  Using  the Toilet? N -  In the past six months, have you accidently leaked urine? Y -  Do you have problems with loss of bowel control? N -  Managing your Medications? N -  Managing your Finances? N -  Housekeeping or managing your Housekeeping? N -  Some recent data might be hidden    Patient Care Team: [name], MD as PCP - General (Internal Medicine) Leonie Man, MD as PCP - Cardiology (Cardiology) Conni Slipper, NP as Referring Physician (Nurse Practitioner) Arta Silence, MD as Attending Physician (Gastroenterology) Elayne Snare, MD as Attending Physician (Endocrinology) Matilde Bash, MD as Referring Physician (Neurology) Inocencio Homes, DPM as Consulting Physician (Podiatry)  Indicate any recent Medical Services you may have received from other than [facility] providers in the past year (date may be approximate).     Assessment:   This is a routine wellness examination for [name].  Hearing/Vision screen Vision Screening - Comments:: Regular eye exams, [facility]  Dietary issues and exercise activities discussed: Current Exercise Habits: The patient does not participate in regular exercise at present   Goals Addressed             This Visit's Progress    Patient Stated        07/06/2021, wants to lose weight       Depression Screen PHQ 2/9 Scores 07/06/2021 04/10/2021 01/09/2021 09/26/2020 09/26/2020 08/11/2020 06/06/2020  PHQ - 2 Score 2 0 1 0 0 0 1  PHQ- 9 Score _0 0 - - 6    Fall Risk Fall Risk  07/06/2021 08/11/2020 01/06/2020 09/28/2019 09/11/2019  Falls in the past year? _1 Comment lost balance, slipped off bed lost balance lost balance - -  Number falls in past yr: _2 Injury with Fall? 0 0 0 - 1  Comment - - - - -  Risk Factor Category  - - - - -  Risk for fall due to : Impaired balance/gait;Impaired mobility;Medication side effect Impaired balance/gait;Impaired mobility;Medication side effect Impaired balance/gait;Medication side effect;History of fall(s) - Impaired balance/gait;Impaired mobility;Impaired vision  Follow up Falls evaluation completed;Education provided;Falls prevention discussed Falls evaluation completed;Education provided;Falls prevention discussed Falls evaluation completed;Education provided;Falls prevention discussed - -    FALL RISK PREVENTION PERTAINING TO THE HOME:  Any stairs in or around the home? Yes  If so, are there any without handrails? Yes  Home free of loose throw rugs in walkways, pet beds, electrical cords, etc? Yes  Adequate lighting in your home to reduce risk of falls? Yes   ASSISTIVE DEVICES UTILIZED TO PREVENT FALLS:  Life alert? No  Use of a cane, walker or w/c? Yes  Grab bars in the bathroom? Yes  Shower chair or bench in shower? Yes  Elevated toilet seat or a handicapped toilet? Yes   TIMED UP AND GO:  Was the test performed? No .      Cognitive Function:     6CIT Screen 07/06/2021 08/11/2020 01/06/2020 10/22/2018  What Year? 0 points 0 points 0 points 0 points  What month? 0 points 0 points 0 points 0 points  What time? 0 points 3 points 0 points 0 points  Count back from 20 2 points 2 points 2 points 2 points  Months in reverse 0 points 0 points 0 points 0 points  Repeat  phrase 0 points 0 points 4 points 0 points  Total Score _3 Immunizations  Immunization History  Administered Date(s) Administered   Fluad Quad(high Dose 65+) 06/06/2020, 03/27/2021   Influenza Split 04/11/2011, 06/30/2013   Influenza, High Dose Seasonal PF 02/14/2017, 04/24/2018   Influenza-Unspecified 04/07/2014   PFIZER(Purple Top)SARS-COV-2 Vaccination 08/06/2019, 08/31/2019, 05/04/2020, 11/08/2020   PNEUMOCOCCAL CONJUGATE-20 11/08/2020   Pneumococcal Polysaccharide-23 06/25/2010   Tdap 05/18/2012   Zoster, Live 04/14/2014    TDAP status: Up to date  Flu Vaccine status: Up to date  Pneumococcal vaccine status: Up to date  Covid-19 vaccine status: Completed vaccines  Qualifies for Shingles Vaccine? Yes   Zostavax completed No   Shingrix Completed?: No.    Education has been provided regarding the importance of this vaccine. Patient has been advised to call insurance company to determine out of pocket expense if they have not yet received this vaccine. Advised may also receive vaccine at local pharmacy or Health Dept. Verbalized acceptance and understanding.  Screening Tests Health Maintenance  Topic Date Due   Zoster Vaccines- Shingrix (1 of 2) Never done   FOOT EXAM  12/23/2019   COVID-19 Vaccine (5 - Booster for Pfizer series) 01/03/2021   MAMMOGRAM  07/21/2021   HEMOGLOBIN A1C  09/25/2021   OPHTHALMOLOGY EXAM  02/13/2022   TETANUS/TDAP  05/18/2022   COLONOSCOPY (Pts 45-44yr Insurance coverage will need to be confirmed)  04/04/2031   Pneumonia Vaccine 76 Years old  Completed   INFLUENZA VACCINE  Completed   DEXA SCAN  Completed   Hepatitis C Screening  Completed   HPV VACCINES  Aged Out    Health Maintenance  Health Maintenance Due  Topic Date Due   Zoster Vaccines- Shingrix (1 of 2) Never done   FOOT EXAM  12/23/2019   COVID-19 Vaccine (5 - Booster for PUconseries) 01/03/2021    Colorectal cancer screening: Type of screening: Colonoscopy.  Completed 04/03/2021. Repeat every 5 years  Mammogram status: Completed 07/21/2020. Repeat every year  Bone Density status: Completed 03/30/2013.   Lung Cancer Screening: (Low Dose CT Chest recommended if Age 74-80years, 30 pack-year currently smoking OR have quit w/in 15years.) does not qualify.   Lung Cancer Screening Referral: no  Additional Screening:  Hepatitis C Screening: does qualify; Completed 08/13/2010  Vision Screening: Recommended annual ophthalmology exams for early detection of glaucoma and other disorders of the eye. Is the patient up to date with their annual eye exam?  Yes  Who is the provider or what is the name of the office in which the patient attends annual eye exams? [facility] If pt is not established with a provider, would they like to be referred to a provider to establish care? No .   Dental Screening: Recommended annual dental exams for proper oral hygiene  Community Resource Referral / Chronic Care Management: CRR required this visit?  No   CCM required this visit?  No      Plan:     I have personally reviewed and noted the following in the patients chart:   Medical and social history Use of alcohol, tobacco or illicit drugs  Current medications and supplements including opioid prescriptions.  Functional ability and status Nutritional status Physical activity Advanced directives List of other physicians Hospitalizations, surgeries, and ER visits in previous 12 months Vitals Screenings to include cognitive, depression, and falls Referrals and appointments  In addition, I have reviewed and discussed with patient certain preventive protocols, quality metrics, and best practice recommendations. A written personalized care plan for preventive services as well as general preventive health recommendations  were provided to patient.     [name], LPN   6/85/9923   Nurse Notes: none

## 2021-07-07 ENCOUNTER — Other Ambulatory Visit: Payer: Self-pay

## 2021-07-07 ENCOUNTER — Encounter: Payer: Self-pay | Admitting: Internal Medicine

## 2021-07-07 DIAGNOSIS — R059 Cough, unspecified: Secondary | ICD-10-CM

## 2021-07-09 ENCOUNTER — Other Ambulatory Visit: Payer: Self-pay | Admitting: Internal Medicine

## 2021-07-09 DIAGNOSIS — N2889 Other specified disorders of kidney and ureter: Secondary | ICD-10-CM

## 2021-07-11 ENCOUNTER — Encounter: Payer: Self-pay | Admitting: Internal Medicine

## 2021-07-12 NOTE — Patient Instructions (Addendum)
Visit Information It was great speaking with you today!  Please let me know if you have any questions about our visit.   Goals Addressed             This Visit's Progress    Manage My Medicine       Timeframe:  Long-Range Goal Priority:  High Start Date:                             Expected End Date:                       Follow Up Date 12/20/2021   In Progress:  - call for medicine refill 2 or 3 days before it runs out - call if I am sick and can't take my medicine - keep a list of all the medicines I take; vitamins and herbals too - learn to read medicine labels - use an alarm clock or phone to remind me to take my medicine    Why is this important?   These steps will help you keep on track with your medicines.   Notes:  Please call if you have any questions about your medications.       Patient Care Plan: CCM Pharmacy Care Plan     Problem Identified: DM, HTN, ATHEROSCLEROSIS OF AORTA      Long-Range Goal: Disease Management   Note:   Current Barriers:  Unable to independently monitor therapeutic efficacy  Pharmacist Clinical Goal(s):  Patient will achieve adherence to monitoring guidelines and medication adherence to achieve therapeutic efficacy through collaboration with PharmD and provider.   Interventions: 1:1 collaboration with [name], MD regarding development and update of comprehensive plan of care as evidenced by provider attestation and co-signature Inter-disciplinary care team collaboration (see longitudinal plan of care) Comprehensive medication review performed; medication list updated in electronic medical record  Hypertension (BP goal <130/80) -Controlled -Current treatment: Isosorbide  mononitrate 20 mg tablet once per day Hydralazine 100 mg tablet three times per day  Carvedilol 12.5 mg taking 1 tablet by mouth twice per day  -Current home readings: 06/28/2021: 154/76, 1/6 -152/60, 1/7-151/72, 1/9-147/68, 128/76 pulse with in  range -Current dietary habits: she nor her husband cook with salt  at this time  -Current exercise habits: she is walking through the house from her room, to all the other rooms includings the living room  -Denies hypotensive/hypertensive symptoms -Educated on Daily salt intake goal < 2300 mg; Exercise goal of 150 minutes per week; -Counseled to monitor BP at home at least three times per week, document, and provide log at future appointments -Recommended to continue current medication  Atherosclerosis of the Aorta: (LDL goal < 70) -Controlled -Current treatment: Atorvastatin 10 mg tablet once per day.  Furosemide 80 mg tablet daily Isosorbide mononitrate 30 mg tablet daily Hydralazine 100 mg tablet three times daily  Carvedilol 12.5 mg tablet twice per day -Current dietary patterns: in the morning she eating a fruit, for lunch she is eating what they had for dinner, lettuce, and tomato, her sister usually cooks a meal with a toss salad, and or cabbage or collard greens  -Current exercise habits:  -Educated on Cholesterol goals;  Benefits of statin for ASCVD risk reduction; Exercise goal of 150 minutes per week; -Counseled on diet and exercise extensively Recommended to continue current medication  Diabetes (A1c goal <7%) -Not ideally controlled -Current medications: Ozempic 1 mg -  inject 1 mg into the skin once a week Humalog Kwik Pen- inject 2-4 UNITS Into the skin three times daily as needed for high blood sugar over 150  Toujeo solostar 300 unit/ml - inject 36 units into the skin at bedtime Currently injecting 25 units at bedtime  -Current home glucose readings fasting glucose: 111, 120, 99  post prandial glucose: 146, 136 -Denies hypoglycemic/hyperglycemic symptoms -Current meal patterns:  breakfast: patient reports that she rarely eats breakfast   dinner: vegetables and protein   snacks: patient sometimes  drinks: drinking plenty of water  -Current exercise: patient  reports walking around the house  -Educated on Prevention and management of hypoglycemic episodes; -Counseled to check feet daily and get yearly eye exam -Congratulated patient on her decrease in A1c from 8.4 to 7.3 -Counseled on diet and exercise extensively Recommended to continue current medication  Health Maintenance -Vaccine gaps: COVID-19 Booster, Shingrix vaccine -Educated on the importance of COVID -19 booster, and the Shingles vaccine -Patient is going to sign up for the booster shot.  -Collaborated with patient and she is going to receive both vaccines.    Patient Goals/Self-Care Activities Patient will:  - take medications as prescribed as evidenced by patient report and record review  Follow Up Plan: The patient has been provided with contact information for the care management team and has been advised to call with any health related questions or concerns.         Patient agreed to services and verbal consent obtained.   The patient verbalized understanding of instructions, educational materials, and care plan provided today and agreed to receive a mailed copy of patient instructions, educational materials, and care plan.   [name], PharmD Clinical Pharmacist [facility] [phone]

## 2021-07-18 ENCOUNTER — Other Ambulatory Visit: Payer: Self-pay

## 2021-07-18 ENCOUNTER — Ambulatory Visit: Payer: Medicare HMO

## 2021-07-18 VITALS — BP 122/60 | HR 66 | Temp 97.8°F | Ht 61.0 in | Wt 189.8 lb

## 2021-07-18 DIAGNOSIS — E1122 Type 2 diabetes mellitus with diabetic chronic kidney disease: Secondary | ICD-10-CM

## 2021-07-18 NOTE — Progress Notes (Signed)
Patient presents today for nurse visit for medication clarification.   Toujeo states that she has been taking 25 units and feels good with this dose. Pt would like to stop dexilant but provider advised her to take on Monday Wednesday and fridays. Pt will continue all medications as noted in chart and follow up with provider in February.

## 2021-07-25 DIAGNOSIS — E1122 Type 2 diabetes mellitus with diabetic chronic kidney disease: Secondary | ICD-10-CM | POA: Diagnosis not present

## 2021-07-25 DIAGNOSIS — I110 Hypertensive heart disease with heart failure: Secondary | ICD-10-CM | POA: Diagnosis not present

## 2021-07-25 DIAGNOSIS — Z794 Long term (current) use of insulin: Secondary | ICD-10-CM

## 2021-07-25 DIAGNOSIS — I5032 Chronic diastolic (congestive) heart failure: Secondary | ICD-10-CM

## 2021-07-25 DIAGNOSIS — I25118 Atherosclerotic heart disease of native coronary artery with other forms of angina pectoris: Secondary | ICD-10-CM

## 2021-07-25 DIAGNOSIS — N182 Chronic kidney disease, stage 2 (mild): Secondary | ICD-10-CM | POA: Diagnosis not present

## 2021-07-27 ENCOUNTER — Encounter: Payer: Self-pay | Admitting: Internal Medicine

## 2021-07-27 DIAGNOSIS — Z1231 Encounter for screening mammogram for malignant neoplasm of breast: Secondary | ICD-10-CM | POA: Diagnosis not present

## 2021-07-28 ENCOUNTER — Telehealth: Payer: Self-pay

## 2021-07-28 NOTE — Chronic Care Management (AMB) (Signed)
Chronic Care Management Pharmacy Assistant   Name: [name]  MRN: [id number] DOB: January 25, 1946  Reason for Encounter: Disease State/ Diabetes  Recent office visits:  07-18-2021 [name] CMA. "Patient presents today for nurse visit for medication clarification. Patient has been taking Toujeo 25 units and feels good with this dose. Pt would like to stop dexilant but provider advised her to take on Monday Wednesday and fridays. Pt will continue all medications as noted in chart and follow up with provider in February".   07-06-2021 [name], LPN. Annual wellness visit.  Recent consult visits:  08-01-2021 [name], FNP (Heart/Vascular). Glucose= 110, BUN= 7. Follow up in 3-4 months.  Hospital visits:  None in previous 6 months  Medications: Outpatient Encounter Medications as of 07/28/2021  Medication Sig   acetaminophen (TYLENOL) 500 MG tablet Take 500-1,000 mg by mouth every 4 (four) hours as needed for moderate pain.    albuterol (VENTOLIN HFA) 108 (90 Base) MCG/ACT inhaler Inhale 1-2 puffs into the lungs every 6 (six) hours as needed for wheezing or shortness of breath.   Alcohol Swabs (B-D SINGLE USE SWABS REGULAR) PADS USE TWICE DAILY AS DIRECTED   Ascorbic Acid (VITAMIN C) 1000 MG tablet Take 1,000 mg by mouth daily.   atorvastatin (LIPITOR) 10 MG tablet TAKE 1 TABLET EVERY EVENING.   benzonatate (TESSALON PERLES) 100 MG capsule Take 1 capsule (100 mg total) by mouth 3 (three) times daily as needed for cough.   Blood Glucose Calibration (TRUE METRIX LEVEL 1) Low SOLN Use as directed  Dx:e11.22   Blood Glucose Monitoring Suppl (TRUE METRIX METER) w/Device KIT USE AS DIRECTED   carvedilol (COREG) 12.5 MG tablet Take 1 tablet (12.5 mg total) by mouth 2 (two) times daily.   dexlansoprazole (DEXILANT) 60 MG capsule Take 1 capsule (60 mg total) by mouth daily. (Patient taking differently: Take 60 mg by mouth every Monday, Wednesday, and Friday.)    diclofenac sodium (VOLTAREN) 1 % GEL Apply 2 g topically 4 (four) times daily as needed (pain).   ferrous sulfate 324 MG TBEC Take 324 mg by mouth 2 (two) times daily.   furosemide (LASIX) 80 MG tablet Take 80 mg by mouth daily.   Glucagon (GVOKE HYPOPEN 1-PACK) 0.5 MG/0.1ML SOAJ Inject 0.5 mg into the skin daily as needed.   glucose blood (TRUE METRIX BLOOD GLUCOSE TEST) test strip USE AS DIRECTED TO CHECK BLOOD SUGARS 2 TIMES PER DAY   guaiFENesin-dextromethorphan (ROBITUSSIN DM) 100-10 MG/5ML syrup Take 10 mLs by mouth every 6 (six) hours as needed for cough.   hydrALAZINE (APRESOLINE) 100 MG tablet Take 1 tablet (100 mg total) by mouth 3 (three) times daily.   hydrocortisone cream 1 % Apply 1 application topically daily as needed for itching.    insulin lispro (HUMALOG KWIKPEN) 100 UNIT/ML KwikPen Inject 0.02-0.04 mLs (2-4 Units total) into the skin 3 (three) times daily as needed (high blood sugar over 150).   Insulin Pen Needle (B-D ULTRAFINE III SHORT PEN) 31G X 8 MM MISC USE DAILY WITH VICTOZA AND/OR NOVOLOG.   Isopropyl Alcohol (ALCOHOL WIPES) 70 % MISC Apply 1 each topically 2 (two) times daily.   isosorbide mononitrate (IMDUR) 30 MG 24 hr tablet TAKE 1 TABLET EVERY DAY (APPOINTMENT IS NEEDED FOR REFILLS)   lamoTRIgine (LAMICTAL) 150 MG tablet Take 1 tablet (150 mg total) by mouth 2 (two) times daily.   levETIRAcetam (KEPPRA) 500 MG tablet Take 1,000 mg by mouth at bedtime.  ondansetron (ZOFRAN) 4 MG tablet Take 4 mg by mouth every 8 (eight) hours as needed for nausea or vomiting.   OZEMPIC, 1 MG/DOSE, 4 MG/3ML SOPN INJECT 1 MG INTO THE SKIN ONCE A WEEK.   Pancrelipase, Lip-Prot-Amyl, 25000-79000 units CPEP Take 1-2 capsules by mouth See admin instructions. Take 1 capsules in the morning with breakfast, 1 capsule with lunch, and 2 capsules with supper   potassium chloride SA (KLOR-CON) 20 MEQ tablet TAKE 2 TABLETS EVERY DAY BUT WHEN TAKING METOLAZONE TAKE 3 TABLETS DAILY AS DIRECTED    sennosides-docusate sodium (SENOKOT-S) 8.6-50 MG tablet Take 1-2 tablets by mouth at bedtime as needed for constipation.   sertraline (ZOLOFT) 50 MG tablet TAKE 1 TABLET EVERY DAY   tiotropium (SPIRIVA HANDIHALER) 18 MCG inhalation capsule Place 1 capsule (18 mcg total) into inhaler and inhale daily.   tolnaftate (TINACTIN) 1 % cream Apply 1 application topically daily as needed (foot fungus).    TOUJEO SOLOSTAR 300 UNIT/ML Solostar Pen INJECT 36 UNITS INTO THE SKIN AT BEDTIME. (Patient taking differently: 25 Units.)   TRUEplus Lancets 33G MISC Use as directed to check blood sugars 2 times per day dx: e11.22   No facility-administered encounter medications on file as of 07/28/2021.  Recent Relevant Labs: Lab Results  Component Value Date/Time   HGBA1C 7.3 (H) 03/27/2021 03:36 PM   HGBA1C 8.4 (H) 01/09/2021 03:56 PM   MICROALBUR 10 06/06/2020 01:23 PM   MICROALBUR 10 01/06/2020 12:16 PM    Kidney Function Lab Results  Component Value Date/Time   CREATININE 0.85 05/04/2021 10:14 AM   CREATININE 0.87 04/10/2021 12:31 PM   CREATININE 0.91 10/04/2016 11:44 AM   GFR 86.68 05/15/2016 01:35 PM   GFRNONAA >60 05/04/2021 10:14 AM   GFRNONAA 65 10/04/2016 11:44 AM   GFRAA 75 06/06/2020 04:00 PM   GFRAA 75 10/04/2016 11:44 AM    Current antihyperglycemic regimen:  Ozempic 1 mg  weekly Humalog Kwik Pen- inject 2-4 UNITS Into the skin three times daily as needed for high blood sugar over 150  Toujeo solostar 300 unit/ml - inject 25 units into the skin at bedtime  What recent interventions/DTPs have been made to improve glycemic control:  Educated on Prevention and management of hypoglycemic episodes; -Counseled to check feet daily and get yearly eye exams -Congratulated patient on her decrease in A1c from 8.4 to 7.3 -Counseled on diet and exercise extensively Recommended to continue current medication  Have there been any recent hospitalizations or ED visits since last visit with CPP?  No  Patient denies hypoglycemic symptoms  Patient denies hyperglycemic symptoms  How often are you checking your blood sugar? once daily patient stated sometimes twice daily  What are your blood sugars ranging?  Fasting: 118, 107, 125 Before meals: None After meals: None Bedtime: None  During the week, how often does your blood glucose drop below 70? Never  Are you checking your feet daily/regularly? Daily  Adherence Review: Is the patient currently on a STATIN medication? Yes Is the patient currently on ACE/ARB medication? No Does the patient have >5 day gap between last estimated fill dates? Yes   Care Gaps: Yearly foot exam overdue Yearly mammogram overdue AWV 07-18-2022  Star Rating Drugs: Atorvastatin 10 mg- Last filled 07-28-2021 [facility] Ozempic 1 mg- Last filled 05-25-2021 [facility] Clinical Pharmacist Assistant [phone]

## 2021-07-31 NOTE — Progress Notes (Signed)
PCP: [name], MD Cardiology: Dr. Ellyn Hack HF Cardiology: Dr. [name]  76 y.o. with history of OHS/OSA and RV dysfunction was referred by Dr. Ellyn Hack for evaluation of CHF.  She has a history of OSA/OHS.  She uses CPAP at night but not currently using oxygen during the day.  Remote smoker.  She had an echo in 9/20 showing EF 50-55% but moderate-severe RV dysfunction with mild-moderate RV dilation.  RHC/LHC in 10/20 showed normal coronaries, pulmonary venous hypertension.  V/Q scan in 2/21 did not show evidence for acute on chronic PE.    Cardiomems was placed in 5/21.  RHC at that time showed normal filling pressures and moderate pulmonary hypertension but suspect due to high output.  No evidence for shunt lesion.  Echo showed EF 60-65%, enlarged RV with normal systolic function.   She was admitted in 5/21 with syncope, thought to be due to a seizure.    She had COVID-19 infection in 8/21. 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   Echo in 9/21 with EF 50-55%, severe RV enlargement and severely decreased RV systolic function, PASP 54 mmHg.    She was admitted in 5/22 with CHF, bronchitis.  She was diuresed and treated with antibiotics. Echo in 5/22 showed EF 60-65%, mild LVH, ?normal RV, severe LAE, ?normal PA pressure, IVC normal.  V/Q scan negative.   In 10/22, she was admitted with GI bleeding, thought to be diverticular.  BP meds were decreased and Lasix was cut back.  With increased weight, she increased her Lasix back to 80 mg daily.   Today she returns for HF follow up. She has exertional dyspnea if she walks for longer distances on flat ground or walks up inclines or stairs. She walks with a cane due to issues with balance. She has some atypical chest pressure today she attributes to stress, but otherwise no issues. She is sleeping on 3 pillows now, this is new for her. Denies  abnormal bleeding, palpitations, dizziness, or edema. Appetite ok. No fever or chills. Weight at home 194 pounds. Taking all medications. She is not wearing her CPAP, awakens and finds she has taken if off during sleep. BP at home ~ 147/76 this AM before medications.  ECG (personally reviewed): none ordered today.  Cardiomems PADP 23 mmHg (goal 23).   Labs (10/20): K 4.1, creatinine 1.15, hgb 11.4 Labs (6/21): K 3.5, creatinine 1.04, BNP 81 Labs (12/21): K 3.7, creatinine 0.79 Labs (10/22): K 3.9, creatinine 0.87 Labs (11/22): K 3.9, creatinine 0.85, LDL 63  PMH: 1. OHS/OSA: She uses CPAP.  2. H/o CVA 3. Seizure disorder 4. Type 2 DM 5. Depression 6. HTN 7. Hyperlipidemia 8. Chronic diastolic CHF: Echo (3/66) with EF 50-55%, moderate to severe RV dilation with mild-moderately decreased RV systolic function.  - RHC/LHC (10/20): normal coronaries; mean RA 10, PA 56/14 mean 31, mean PCWP 13, LVEDP 18, CO/CI 7.35/3.73, PVR 2.44.  - RHC/Cardiomems placement (5/21): mean RA 7, PA 58/21 mean 34, PCWP mean 12, CI 8.3 F/3.95 T, PVR 2.86 WU. No evidence for shunt lesion.  - Echo (5/21): EF 60-65%, RV enlarged with normal systolic function.  - Echo (9/21): EF 50-55%, severe RV enlargement and severely decreased RV systolic function, PASP 54 mmHg.  - Echo (5/22): EF 60-65%, mild LVH, ?normal RV, severe LAE, ?normal PA pressure, IVC normal. 9. Primarily pulmonary venous hypertension.  - V/Q scan (2/21, 5/22): No evidence  for acute or chronic PE.  - PFTs normal in 5/21.  10. COVID-19 PNA 8/21.  - CT chest in 10/21 with emphysema and mild fibrosis concerning for post-infectious inflammatory fibrosis from COVID.  11. GI bleeding: 10/22, diverticular bleed.   Social History   Socioeconomic History   Marital status: Divorced    Spouse name: Not on file   Number of children: Not on file   Years of education: Not on file   Highest education level: Not on file  Occupational History   Occupation:  Disabled    Comment: CNA  Tobacco Use   Smoking status: Former    Packs/day: 0.50    Years: 25.00    Pack years: 12.50    Types: Cigarettes   Smokeless tobacco: Never   Tobacco comments:    quit smoking 20+ytrs ago  Vaping Use   Vaping Use: Never used  Substance and Sexual Activity   Alcohol use: No   Drug use: No   Sexual activity: Not Currently    Birth control/protection: Surgical  Other Topics Concern   Not on file  Social History Narrative   She lives in [place]. She has lots of family in the area and is accompanied by her sister.   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   She has a distant history of smoking, quit 20 years ago.   Social Determinants of Health   Financial Resource Strain: Low Risk    Difficulty of Paying Living Expenses: Not hard at all  Food Insecurity: No Food Insecurity   Worried About Charity fundraiser in the Last Year: Never true   [place] in the Last Year: Never true  Transportation Needs: No Transportation Needs   Lack of Transportation (Medical): No   Lack of Transportation (Non-Medical): No  Physical Activity: Inactive   Days of Exercise per Week: 0 days   Minutes of Exercise per Session: 0 min  Stress: No Stress Concern Present   Feeling of Stress : Only a little  Social Connections: Not on file  Intimate Partner Violence: Not on file   Family History  Problem Relation Age of Onset   Allergies Father    Heart disease Father        before 73   Heart failure Father    Hypertension Father    Hyperlipidemia Father    Heart disease Mother    Diabetes Mother    Hypertension Mother    Hyperlipidemia Mother    Hypertension Sister    Heart disease Sister        before 50   Hyperlipidemia Sister    Diabetes Brother    Hypertension Brother    Diabetes Sister    Hypertension Sister    Diabetes Son    Hypertension Son    Cancer Other    Current Outpatient Medications  Medication Sig  Dispense Refill   acetaminophen (TYLENOL) 500 MG tablet Take 500-1,000 mg by mouth every 4 (four) hours as needed for moderate pain.      albuterol (VENTOLIN HFA) 108 (90 Base) MCG/ACT inhaler Inhale 1-2 puffs into the lungs every 6 (six) hours as needed for wheezing or shortness of breath. 18 g 5   Alcohol Swabs (B-D SINGLE USE SWABS REGULAR) PADS USE TWICE DAILY AS DIRECTED 200 each 6   Ascorbic Acid (VITAMIN C) 1000 MG tablet Take 1,000 mg by mouth daily.     atorvastatin (LIPITOR) 10 MG tablet  TAKE 1 TABLET EVERY EVENING. 90 tablet 2   benzonatate (TESSALON PERLES) 100 MG capsule Take 1 capsule (100 mg total) by mouth 3 (three) times daily as needed for cough. 30 capsule 1   Blood Glucose Calibration (TRUE METRIX LEVEL 1) Low SOLN Use as directed  Dx:e11.22 1 each 2   Blood Glucose Monitoring Suppl (TRUE METRIX METER) w/Device KIT USE AS DIRECTED 1 kit 1   carvedilol (COREG) 12.5 MG tablet Take 1 tablet (12.5 mg total) by mouth 2 (two) times daily. 180 tablet 3   dexlansoprazole (DEXILANT) 60 MG capsule Take 1 capsule (60 mg total) by mouth daily. (Patient taking differently: Take 60 mg by mouth every Monday, Wednesday, and Friday.) 90 capsule 2   diclofenac sodium (VOLTAREN) 1 % GEL Apply 2 g topically 4 (four) times daily as needed (pain).     ferrous sulfate 324 MG TBEC Patient takes 1 tablet by mouth every other day.     furosemide (LASIX) 80 MG tablet Patient takes 1 tablet by mouth daily and 1/2 tablet on Saturdays and Sundays.     Glucagon (GVOKE HYPOPEN 1-PACK) 0.5 MG/0.1ML SOAJ Inject 0.5 mg into the skin daily as needed. 0.1 mL 11   glucose blood (TRUE METRIX BLOOD GLUCOSE TEST) test strip USE AS DIRECTED TO CHECK BLOOD SUGARS 2 TIMES PER DAY 200 strip 2   guaiFENesin-dextromethorphan (ROBITUSSIN DM) 100-10 MG/5ML syrup Take 10 mLs by mouth every 6 (six) hours as needed for cough. 118 mL 0   hydrALAZINE (APRESOLINE) 100 MG tablet Take 1 tablet (100 mg total) by mouth 3 (three) times  daily. 270 tablet 3   hydrocortisone cream 1 % Apply 1 application topically daily as needed for itching.      insulin lispro (HUMALOG KWIKPEN) 100 UNIT/ML KwikPen Inject 0.02-0.04 mLs (2-4 Units total) into the skin 3 (three) times daily as needed (high blood sugar over 150). 15 mL 2   Insulin Pen Needle (B-D ULTRAFINE III SHORT PEN) 31G X 8 MM MISC USE DAILY WITH VICTOZA AND/OR NOVOLOG. 400 each 2   Isopropyl Alcohol (ALCOHOL WIPES) 70 % MISC Apply 1 each topically 2 (two) times daily. 300 each 3   isosorbide mononitrate (IMDUR) 30 MG 24 hr tablet TAKE 1 TABLET EVERY DAY (APPOINTMENT IS NEEDED FOR REFILLS) 90 tablet 2   lamoTRIgine (LAMICTAL) 150 MG tablet Take 1 tablet (150 mg total) by mouth 2 (two) times daily. 180 tablet 3   levETIRAcetam (KEPPRA) 500 MG tablet Take 1,000 mg by mouth at bedtime.     ondansetron (ZOFRAN) 4 MG tablet Take 4 mg by mouth every 8 (eight) hours as needed for nausea or vomiting.     OZEMPIC, 1 MG/DOSE, 4 MG/3ML SOPN INJECT 1 MG INTO THE SKIN ONCE A WEEK. 9 mL 3   Pancrelipase, Lip-Prot-Amyl, 25000-79000 units CPEP Take 1-2 capsules by mouth See admin instructions. Take 1 capsules in the morning with breakfast, 1 capsule with lunch, and 2 capsules with supper     potassium chloride SA (KLOR-CON) 20 MEQ tablet TAKE 2 TABLETS EVERY DAY BUT WHEN TAKING METOLAZONE TAKE 3 TABLETS DAILY AS DIRECTED 204 tablet 2   sennosides-docusate sodium (SENOKOT-S) 8.6-50 MG tablet Take 1-2 tablets by mouth at bedtime as needed for constipation.     sertraline (ZOLOFT) 50 MG tablet TAKE 1 TABLET EVERY DAY 90 tablet 2   tiotropium (SPIRIVA HANDIHALER) 18 MCG inhalation capsule Place 1 capsule (18 mcg total) into inhaler and inhale daily. 90 capsule 3  tolnaftate (TINACTIN) 1 % cream Apply 1 application topically daily as needed (foot fungus).      TOUJEO SOLOSTAR 300 UNIT/ML Solostar Pen INJECT 36 UNITS INTO THE SKIN AT BEDTIME. (Patient taking differently: 25 Units.) 13.5 mL 1    TRUEplus Lancets 33G MISC Use as directed to check blood sugars 2 times per day dx: e11.22 300 each 2   No current facility-administered medications for this encounter.   Wt Readings from Last 3 Encounters:  08/01/21 89 kg (196 lb 3.2 oz)  07/18/21 86.1 kg (189 lb 12.8 oz)  07/06/21 88 kg (194 lb)   BP 136/70    Pulse 73    Wt 89 kg (196 lb 3.2 oz)    SpO2 96%    BMI 37.07 kg/m  General:  NAD. No resp difficulty, walked into clinic with cane. HEENT: Normal Neck: Supple. No JVD. Carotids 2+ bilat; no bruits. No lymphadenopathy or thryomegaly appreciated. Cor: PMI nondisplaced. Regular rate & rhythm. No rubs, gallops or murmurs. Lungs: Clear Abdomen: Obese, nontender, nondistended. No hepatosplenomegaly. No bruits or masses. Good bowel sounds. Extremities: No cyanosis, clubbing, rash, edema Neuro: Alert & oriented x 3, cranial nerves grossly intact. Moves all 4 extremities w/o difficulty. Affect pleasant.  Assessment/Plan: 1. Chronic diastolic CHF/RV failure: Echo in 9/20 showed EF 50-55% with moderate-severe RV dilation and mild-moderately decreased systolic function. RHC/LHC showed mild-moderate pulmonary venous hypertension and no significant coronary disease.  Cause of RV dysfunction is not clear, possibly due to diastolic CHF + OHS/OSA.  V/Q scan in 2/21 did not show acute or chronic PE.  Cardiomems was placed in 5/21, normal filling pressures with moderate pulmonary hypertension likely due to high output (no evidence for shunt lesion on RHC).  Echo in 9/21 with EF 50-55%, severe RVE with severe RV dysfunction.  Echo in 5/22 with EF 60-65%, RV function reportedly normal with normal PA pressure.  She is not volume overloaded today by exam or Cardiomems and weight is down 3 lbs.  She has chronic NYHA class III symptoms. Suspect that her dyspnea is primarily pulmonary, based on last CT, she has developed mild pulmonary fibrosis since COVID, possibly post-inflammatory.   - Continue carvedilol  12.5 mg bid. - Continue Lasix 80 mg daily and 40 mg on Sat/Sun. BMET today.  - She did not tolerate SGLT2-inhibitor due to recurrent yeast infections.    2. OHS/OSA: She is using CPAP but not oxygen during the day (no longer appears to need). CT chest in 10/21 with emphysema and mild fibrosis, possibly post-inflammatory after COVID-19 infection.   - Follows with pulmonary (Dr. Vaughan Browner).  3. Pulmonary hypertension: RHC in 10/20 showed mild-moderate pulmonary venous hypertension.  RHC in 5/21 showed moderate pulmonary hypertension with low PVR and high CO, likely PH due to high output, no evidence for shunt lesion. No role for selective pulmonary vasodilators.  4. HTN: She is off amlodipine since last hospitalization. BP improved today. She checks BP frequently at home. - Continue hydralazine 100 mg tid + Imdur 30 mg daily.  5. Obesity: Body mass index is 37.07 kg/m. She is on semaglutide.  6. GI bleeding: No overt bleeding. CBC (11/22) stable.  Followup in 3-4 months with Dr. [name].   [place] 08/01/2021

## 2021-08-01 ENCOUNTER — Encounter (HOSPITAL_COMMUNITY): Payer: Self-pay

## 2021-08-01 ENCOUNTER — Ambulatory Visit (HOSPITAL_COMMUNITY)
Admission: RE | Admit: 2021-08-01 | Discharge: 2021-08-01 | Disposition: A | Discharge status: Home / Self Care | Payer: Medicare HMO | Source: Ambulatory Visit | Attending: Family Medicine | Admitting: Family Medicine

## 2021-08-01 ENCOUNTER — Other Ambulatory Visit: Payer: Self-pay

## 2021-08-01 VITALS — BP 136/70 | HR 73 | Wt 196.2 lb

## 2021-08-01 DIAGNOSIS — F17210 Nicotine dependence, cigarettes, uncomplicated: Secondary | ICD-10-CM | POA: Diagnosis not present

## 2021-08-01 DIAGNOSIS — E662 Morbid (severe) obesity with alveolar hypoventilation: Secondary | ICD-10-CM

## 2021-08-01 DIAGNOSIS — I10 Essential (primary) hypertension: Secondary | ICD-10-CM

## 2021-08-01 DIAGNOSIS — Z79899 Other long term (current) drug therapy: Secondary | ICD-10-CM | POA: Insufficient documentation

## 2021-08-01 DIAGNOSIS — E669 Obesity, unspecified: Secondary | ICD-10-CM

## 2021-08-01 DIAGNOSIS — I110 Hypertensive heart disease with heart failure: Secondary | ICD-10-CM | POA: Insufficient documentation

## 2021-08-01 DIAGNOSIS — Z6837 Body mass index (BMI) 37.0-37.9, adult: Secondary | ICD-10-CM | POA: Diagnosis not present

## 2021-08-01 DIAGNOSIS — G4733 Obstructive sleep apnea (adult) (pediatric): Secondary | ICD-10-CM | POA: Insufficient documentation

## 2021-08-01 DIAGNOSIS — Z8719 Personal history of other diseases of the digestive system: Secondary | ICD-10-CM

## 2021-08-01 DIAGNOSIS — I5032 Chronic diastolic (congestive) heart failure: Secondary | ICD-10-CM

## 2021-08-01 DIAGNOSIS — I272 Pulmonary hypertension, unspecified: Secondary | ICD-10-CM

## 2021-08-01 LAB — BASIC METABOLIC PANEL
Anion gap: 7 (ref 5–15)
BUN: 7 mg/dL — ABNORMAL LOW (ref 8–23)
CO2: 27 mmol/L (ref 22–32)
Calcium: 9.1 mg/dL (ref 8.9–10.3)
Chloride: 102 mmol/L (ref 98–111)
Creatinine, Ser: 0.8 mg/dL (ref 0.44–1.00)
GFR, Estimated: >60 mL/min (ref >60)
Glucose, Bld: 110 mg/dL — ABNORMAL HIGH (ref 70–99)
Potassium: 4.2 mmol/L (ref 3.5–5.1)
Sodium: 136 mmol/L (ref 135–145)

## 2021-08-01 NOTE — Patient Instructions (Signed)
It was great to see you today! ?No medication changes are needed at this time. ? ?Labs today ?We will only contact you if something comes back abnormal or we need to make some changes. ?Otherwise no news is good news! ? ?Your physician recommends that you schedule a follow-up appointment in: 3-4 months with Dr McLean ? ? ?Do the following things EVERYDAY: ?Weigh yourself in the morning before breakfast. Write it down and keep it in a log. ?Take your medicines as prescribed ?Eat low salt foods--Limit salt (sodium) to 2000 mg per day.  ?Stay as active as you can everyday ?Limit all fluids for the day to less than 2 liters ? ?At the [facility], you and your health needs are our priority. As part of our continuing mission to provide you with exceptional heart care, we have created designated Provider Care Teams. These Care Teams include your primary Cardiologist (physician) and Advanced Practice Providers (APPs- Physician Assistants and Nurse Practitioners) who all work together to provide you with the care you need, when you need it.  ? ?You may see any of the following providers on your designated Care Team at your next follow up: ?Dr Daniel Bensimhon ?Dr Dalton McLean ?Amy Clegg, NP ?Brittainy Simmons, PA ?Jessica Milford,NP ?Lindsay Finch, PA ?Lauren Kemp, PharmD ? ? ?Please be sure to bring in all your medications bottles to every appointment.  ? ?If you have any questions or concerns before your next appointment please send us a message through mychart or call our office at [phone].   ? ?TO LEAVE A MESSAGE FOR THE NURSE SELECT OPTION 2, PLEASE LEAVE A MESSAGE INCLUDING: ?YOUR NAME ?DATE OF BIRTH ?CALL BACK NUMBER ?REASON FOR CALL**this is important as we prioritize the call backs ? ?YOU WILL RECEIVE A CALL BACK THE SAME DAY AS LONG AS YOU CALL BEFORE 4:00 PM ? ? ?

## 2021-08-02 ENCOUNTER — Ambulatory Visit
Admission: RE | Admit: 2021-08-02 | Discharge: 2021-08-02 | Disposition: A | Discharge status: Home / Self Care | Payer: Medicare HMO | Source: Ambulatory Visit | Attending: Internal Medicine | Admitting: Internal Medicine

## 2021-08-02 DIAGNOSIS — N2889 Other specified disorders of kidney and ureter: Secondary | ICD-10-CM

## 2021-08-02 DIAGNOSIS — K5730 Diverticulosis of large intestine without perforation or abscess without bleeding: Secondary | ICD-10-CM | POA: Diagnosis not present

## 2021-08-02 DIAGNOSIS — I517 Cardiomegaly: Secondary | ICD-10-CM | POA: Diagnosis not present

## 2021-08-02 DIAGNOSIS — N281 Cyst of kidney, acquired: Secondary | ICD-10-CM | POA: Diagnosis not present

## 2021-08-02 MED ORDER — GADOBENATE DIMEGLUMINE 529 MG/ML IV SOLN
17.0000 mL | Freq: Once | INTRAVENOUS | Status: AC | PRN
  Administered 2021-08-02: 17 mL via INTRAVENOUS

## 2021-08-17 ENCOUNTER — Other Ambulatory Visit: Payer: Self-pay

## 2021-08-17 ENCOUNTER — Encounter: Payer: Self-pay | Admitting: Internal Medicine

## 2021-08-17 ENCOUNTER — Ambulatory Visit (INDEPENDENT_AMBULATORY_CARE_PROVIDER_SITE_OTHER): Payer: Medicare HMO | Admitting: Internal Medicine

## 2021-08-17 VITALS — BP 134/70 | HR 74 | Temp 98.3°F | Ht 61.0 in | Wt 193.4 lb

## 2021-08-17 DIAGNOSIS — I110 Hypertensive heart disease with heart failure: Secondary | ICD-10-CM | POA: Diagnosis not present

## 2021-08-17 DIAGNOSIS — Z7189 Other specified counseling: Secondary | ICD-10-CM

## 2021-08-17 DIAGNOSIS — E1122 Type 2 diabetes mellitus with diabetic chronic kidney disease: Secondary | ICD-10-CM | POA: Diagnosis not present

## 2021-08-17 DIAGNOSIS — J9611 Chronic respiratory failure with hypoxia: Secondary | ICD-10-CM

## 2021-08-17 DIAGNOSIS — K439 Ventral hernia without obstruction or gangrene: Secondary | ICD-10-CM | POA: Diagnosis not present

## 2021-08-17 DIAGNOSIS — N182 Chronic kidney disease, stage 2 (mild): Secondary | ICD-10-CM

## 2021-08-17 DIAGNOSIS — F339 Major depressive disorder, recurrent, unspecified: Secondary | ICD-10-CM

## 2021-08-17 DIAGNOSIS — Z794 Long term (current) use of insulin: Secondary | ICD-10-CM

## 2021-08-17 DIAGNOSIS — I25118 Atherosclerotic heart disease of native coronary artery with other forms of angina pectoris: Secondary | ICD-10-CM

## 2021-08-17 DIAGNOSIS — G2111 Neuroleptic induced parkinsonism: Secondary | ICD-10-CM

## 2021-08-17 DIAGNOSIS — Z6836 Body mass index (BMI) 36.0-36.9, adult: Secondary | ICD-10-CM

## 2021-08-17 DIAGNOSIS — I5032 Chronic diastolic (congestive) heart failure: Secondary | ICD-10-CM

## 2021-08-17 NOTE — Patient Instructions (Signed)

## 2021-08-17 NOTE — Progress Notes (Signed)
Rich Brave [place],acting as a Education administrator for [name], MD.,have documented all relevant documentation on the behalf of [name], MD,as directed by  [name], MD while in the presence of [name], MD.  This visit occurred during the SARS-CoV-2 public health emergency.  Safety protocols were in place, including screening questions prior to the visit, additional usage of staff PPE, and extensive cleaning of exam room while observing appropriate contact time as indicated for disinfecting solutions.  Subjective:     Patient ID: [name] , female    DOB: October 23, 1945 , 76 y.o.   MRN: [id number]   Chief Complaint  Patient presents with   Diabetes   Hypertension    HPI  She is here today for a diabetes and BP check. She reports compliance with meds. She denies having any headaches, chest pain and shortness of breath. Patient would like to dscuss her ultrasound, CT and MRI results with you.  She denies having any abdominal discomfort at this time.   Diabetes She presents for her follow-up diabetic visit. She has type 2 diabetes mellitus. Her disease course has been improving. There are no hypoglycemic associated symptoms. Pertinent negatives for diabetes include no blurred vision and no chest pain. There are no hypoglycemic complications. Diabetic complications include nephropathy. Risk factors for coronary artery disease include diabetes mellitus, dyslipidemia, hypertension, obesity, post-menopausal and sedentary lifestyle. She is compliant with treatment some of the time. Her weight is fluctuating minimally. She is following a generally healthy diet. She never participates in exercise. Her home blood glucose trend is decreasing steadily. Her breakfast blood glucose is taken between 8-9 am. Her breakfast blood glucose range is generally 130-140 mg/dl. An ACE inhibitor/angiotensin II receptor blocker is being taken. Eye exam is not current.  Hypertension This is a  chronic problem. The current episode started more than 1 year ago. The problem has been gradually improving since onset. The problem is controlled. Pertinent negatives include no blurred vision, chest pain, palpitations or shortness of breath. The current treatment provides moderate improvement. Hypertensive end-organ damage includes kidney disease. Identifiable causes of hypertension include sleep apnea.    Past Medical History:  Diagnosis Date   Abnormal liver function     in the past.   Anemia    Arthritis    all over   Bruises easily    Cataract    right eye;immature   CHF (congestive heart failure) (HCC)    Chronic back pain    stenosis   Chronic cough    Chronic kidney disease    COPD (chronic obstructive pulmonary disease) ([place])    COVID    aug/sept 2021   Demand myocardial infarction [facility]) 2012   Demand Infarction in setting of Pancreatitis --> mild Troponin elevation, NON-OBSTRUCTIVE CAD   Depression    takes Abilify daily as well as Zoloft   Diastolic heart failure 1314   Grade 1 diastolic Dysfunction by Echo    Diverticulosis    DM (diabetes mellitus) ([place])    takes Victoza daily as well as Lantus and Humalog   Empty sella ([place])    on MRI in 2009.   Glaucoma    Headache(784.0)    last migraine-4-25yr ago   History of blood transfusion    no abnormal reaction noted   History of colon polyps    benign   HTN (hypertension)    takes Benicar,Imdur,and Bystolic daily   Hyperlipidemia    takes Lipitor daily  Joint swelling    Nocturia    Obesity hypoventilation syndrome ([place])    Obstructive sleep apnea 02/2018   Notably improved split-night study with weight loss from 270 (2017) down to 250 pounds (2019).   Pancreatitis    takes Pancrelipase daily   Parkinson's disease ([place])    takes Sinemet daily   Peripheral neuropathy    Pneumonia 2012   Seizures ([place])    takes Lamictal daily and Primidone nightly;last seizure 2wks ago   Urinary urgency    With  increased frequency   Varicose veins of both lower extremities with pain    With edema.  Takes daily Lasix     Family History  Problem Relation Age of Onset   Allergies Father    Heart disease Father        before 86   Heart failure Father    Hypertension Father    Hyperlipidemia Father    Heart disease Mother    Diabetes Mother    Hypertension Mother    Hyperlipidemia Mother    Hypertension Sister    Heart disease Sister        before 48   Hyperlipidemia Sister    Diabetes Brother    Hypertension Brother    Diabetes Sister    Hypertension Sister    Diabetes Son    Hypertension Son    Cancer Other      Current Outpatient Medications:    acetaminophen (TYLENOL) 500 MG tablet, Take 500-1,000 mg by mouth every 4 (four) hours as needed for moderate pain. , Disp: , Rfl:    albuterol (VENTOLIN HFA) 108 (90 Base) MCG/ACT inhaler, Inhale 1-2 puffs into the lungs every 6 (six) hours as needed for wheezing or shortness of breath., Disp: 18 g, Rfl: 5   Alcohol Swabs (B-D SINGLE USE SWABS REGULAR) PADS, USE TWICE DAILY AS DIRECTED, Disp: 200 each, Rfl: 6   Ascorbic Acid (VITAMIN C) 1000 MG tablet, Take 1,000 mg by mouth daily., Disp: , Rfl:    atorvastatin (LIPITOR) 10 MG tablet, TAKE 1 TABLET EVERY EVENING., Disp: 90 tablet, Rfl: 2   benzonatate (TESSALON PERLES) 100 MG capsule, Take 1 capsule (100 mg total) by mouth 3 (three) times daily as needed for cough., Disp: 30 capsule, Rfl: 1   Blood Glucose Calibration (TRUE METRIX LEVEL 1) Low SOLN, Use as directed  Dx:e11.22, Disp: 1 each, Rfl: 2   Blood Glucose Monitoring Suppl (TRUE METRIX METER) w/Device KIT, USE AS DIRECTED, Disp: 1 kit, Rfl: 1   carvedilol (COREG) 12.5 MG tablet, Take 1 tablet (12.5 mg total) by mouth 2 (two) times daily., Disp: 180 tablet, Rfl: 3   dexlansoprazole (DEXILANT) 60 MG capsule, Take 1 capsule (60 mg total) by mouth daily. (Patient taking differently: Take 60 mg by mouth every Monday, Wednesday, and  Friday.), Disp: 90 capsule, Rfl: 2   diclofenac sodium (VOLTAREN) 1 % GEL, Apply 2 g topically 4 (four) times daily as needed (pain)., Disp: , Rfl:    ferrous sulfate 324 MG TBEC, Patient takes 1 tablet by mouth every other day., Disp: , Rfl:    furosemide (LASIX) 80 MG tablet, Patient takes 1 tablet by mouth daily and 1/2 tablet on Saturdays and Sundays., Disp: , Rfl:    Glucagon (GVOKE HYPOPEN 1-PACK) 0.5 MG/0.1ML SOAJ, Inject 0.5 mg into the skin daily as needed., Disp: 0.1 mL, Rfl: 11   glucose blood (TRUE METRIX BLOOD GLUCOSE TEST) test strip, USE AS DIRECTED TO CHECK BLOOD SUGARS 2  TIMES PER DAY, Disp: 200 strip, Rfl: 2   guaiFENesin-dextromethorphan (ROBITUSSIN DM) 100-10 MG/5ML syrup, Take 10 mLs by mouth every 6 (six) hours as needed for cough., Disp: 118 mL, Rfl: 0   hydrALAZINE (APRESOLINE) 100 MG tablet, Take 1 tablet (100 mg total) by mouth 3 (three) times daily., Disp: 270 tablet, Rfl: 3   hydrocortisone cream 1 %, Apply 1 application topically daily as needed for itching. , Disp: , Rfl:    insulin lispro (HUMALOG KWIKPEN) 100 UNIT/ML KwikPen, Inject 0.02-0.04 mLs (2-4 Units total) into the skin 3 (three) times daily as needed (high blood sugar over 150)., Disp: 15 mL, Rfl: 2   Insulin Pen Needle (B-D ULTRAFINE III SHORT PEN) 31G X 8 MM MISC, USE DAILY WITH VICTOZA AND/OR NOVOLOG., Disp: 400 each, Rfl: 2   Isopropyl Alcohol (ALCOHOL WIPES) 70 % MISC, Apply 1 each topically 2 (two) times daily., Disp: 300 each, Rfl: 3   isosorbide mononitrate (IMDUR) 30 MG 24 hr tablet, TAKE 1 TABLET EVERY DAY (APPOINTMENT IS NEEDED FOR REFILLS), Disp: 90 tablet, Rfl: 2   lamoTRIgine (LAMICTAL) 150 MG tablet, Take 1 tablet (150 mg total) by mouth 2 (two) times daily., Disp: 180 tablet, Rfl: 3   levETIRAcetam (KEPPRA) 500 MG tablet, Take 1,000 mg by mouth at bedtime., Disp: , Rfl:    ondansetron (ZOFRAN) 4 MG tablet, Take 4 mg by mouth every 8 (eight) hours as needed for nausea or vomiting., Disp: , Rfl:     OZEMPIC, 1 MG/DOSE, 4 MG/3ML SOPN, INJECT 1 MG INTO THE SKIN ONCE A WEEK., Disp: 9 mL, Rfl: 3   Pancrelipase, Lip-Prot-Amyl, 25000-79000 units CPEP, Take 1-2 capsules by mouth See admin instructions. Take 1 capsules in the morning with breakfast, 1 capsule with lunch, and 2 capsules with supper, Disp: , Rfl:    potassium chloride SA (KLOR-CON) 20 MEQ tablet, TAKE 2 TABLETS EVERY DAY BUT WHEN TAKING METOLAZONE TAKE 3 TABLETS DAILY AS DIRECTED, Disp: 204 tablet, Rfl: 2   sennosides-docusate sodium (SENOKOT-S) 8.6-50 MG tablet, Take 1-2 tablets by mouth at bedtime as needed for constipation., Disp: , Rfl:    sertraline (ZOLOFT) 50 MG tablet, TAKE 1 TABLET EVERY DAY, Disp: 90 tablet, Rfl: 2   tiotropium (SPIRIVA HANDIHALER) 18 MCG inhalation capsule, Place 1 capsule (18 mcg total) into inhaler and inhale daily., Disp: 90 capsule, Rfl: 3   tolnaftate (TINACTIN) 1 % cream, Apply 1 application topically daily as needed (foot fungus). , Disp: , Rfl:    TOUJEO SOLOSTAR 300 UNIT/ML Solostar Pen, INJECT 36 UNITS INTO THE SKIN AT BEDTIME. (Patient taking differently: 25 Units.), Disp: 13.5 mL, Rfl: 1   TRUEplus Lancets 33G MISC, Use as directed to check blood sugars 2 times per day dx: e11.22, Disp: 300 each, Rfl: 2   Allergies  Allergen Reactions   Other Anaphylaxis and Rash    Bleach   Penicillins Hives    Did it involve swelling of the face/tongue/throat, SOB, or low BP? No Did it involve sudden or severe rash/hives, skin peeling, or any reaction on the inside of your mouth or nose? Yes Did you need to seek medical attention at a hospital or doctor's office? Yes When did it last happen?      Childhood allergy  If all above answers are NO, may proceed with cephalosporin use.     Sulfa Antibiotics Hives   Aspirin Other (See Comments)     On aspirin 81 mg - Rectal bleeding in dec 2018  Codeine     Headache and makes the patient feel "off"     Review of Systems  Constitutional: Negative.    Eyes:  Negative for blurred vision.  Respiratory: Negative.  Negative for shortness of breath.   Cardiovascular: Negative.  Negative for chest pain and palpitations.  Neurological: Negative.   Psychiatric/Behavioral: Negative.      Today's Vitals   08/17/21 1113  BP: 134/70  Pulse: 74  Temp: 98.3 F (36.8 C)  Weight: 193 lb 6.4 oz (87.7 kg)  Height: _0  (1.549 m)  PainSc: 0-No pain   Body mass index is 36.54 kg/m.  Wt Readings from Last 3 Encounters:  08/17/21 193 lb 6.4 oz (87.7 kg)  08/01/21 196 lb 3.2 oz (89 kg)  07/18/21 189 lb 12.8 oz (86.1 kg)     Objective:  Physical Exam Vitals and nursing note reviewed.  Constitutional:      Appearance: Normal appearance. She is obese.  HENT:     Head: Normocephalic and atraumatic.     Nose:     Comments: Masked     Mouth/Throat:     Comments: Masked  Eyes:     Extraocular Movements: Extraocular movements intact.  Cardiovascular:     Rate and Rhythm: Normal rate and regular rhythm.     Pulses:          Dorsalis pedis pulses are 1+ on the right side and 1+ on the left side.     Heart sounds: Normal heart sounds.  Pulmonary:     Effort: Pulmonary effort is normal.     Breath sounds: Normal breath sounds.  Musculoskeletal:     Cervical back: Normal range of motion.  Feet:     Right foot:     Protective Sensation: 5 sites tested.  5 sites sensed.     Skin integrity: Dry skin present.     Toenail Condition: Right toenails are normal.     Left foot:     Protective Sensation: 5 sites tested.  5 sites sensed.     Skin integrity: Dry skin present.     Toenail Condition: Left toenails are normal.     Comments: B/l ankle swelling Skin:    General: Skin is warm.  Neurological:     General: No focal deficit present.     Mental Status: She is alert.  Psychiatric:        Mood and Affect: Mood normal.        Behavior: Behavior normal.      IMAGING:  EXAM: CT ABDOMEN AND PELVIS WITHOUT CONTRAST    TECHNIQUE: Multidetector CT imaging of the abdomen and pelvis was performed following the standard protocol without IV contrast.   COMPARISON:  01/27/2019   FINDINGS: Lower chest: Lung bases are clear. Metallic density projects over a left lower lobar pulmonary artery. Mild stable cardiomegaly.   Hepatobiliary: Previous cholecystectomy. Liver and biliary tree are unremarkable.   Pancreas: Unchanged with mild atrophy.   Spleen: Normal.   Adrenals/Urinary Tract: Adrenal glands are normal. Kidneys are normal in size without hydronephrosis or nephrolithiasis. Subcentimeter hypodensity over the upper pole right renal cortex which is hypodense on the previous CT and likely represents a hyperdense cyst although is indeterminate. Ureters and bladder are normal.   Stomach/Bowel: Stomach is unremarkable. Small bowel is normal. Diverticulosis of the colon. Mild fecal retention throughout the colon. Appendix not visualized.   Vascular/Lymphatic: Mild calcified plaque over the abdominal aorta which is normal in caliber. Remaining vascular  structures are unremarkable. No adenopathy.   Reproductive: Previous hysterectomy.   Other: No free fluid or focal inflammatory change. There is a tiny supraumbilical ventral hernia in the midline containing peritoneal fat. This is unchanged.   Musculoskeletal: Degenerative changes of the spine. Posterior fusion hardware intact from L4-S1 with interbody fusion at the L4-5 and L5-S1 levels. Mild degenerative change of the hips.   IMPRESSION: 1. No acute findings in the abdomen/pelvis. 2. Tiny supraumbilical ventral hernia containing peritoneal fat unchanged. 3. Colonic diverticulosis without active inflammation. 4. Subcentimeter right renal cortical hypodensity unchanged and likely a hyperdense cyst, although indeterminate. Recommend further evaluation with ultrasound. 5. Aortic atherosclerosis.   Aortic Atherosclerosis  (ICD10-I70.0).  Electronically Signed   By: Marin Olp M.D.   On: 06/03/2021 12:38  RENAL / URINARY TRACT ULTRASOUND COMPLETE   COMPARISON:  CT abdomen and pelvis 06/03/2021.   FINDINGS: Right Kidney:   Renal measurements: 11.3 x 4.8 x 5.1 cm = volume: 145 mL. Echogenicity within normal limits. No hydronephrosis visualized. There is an 11 x 9 x 4 mm cyst in the superior pole of the right kidney location in size do not correspond to CT abnormality in question.   Left Kidney:   Renal measurements: 11.5 x 6.1 x 5.1 cm = volume: 187 mL. Echogenicity within normal limits. No mass or hydronephrosis visualized.   Bladder:   Appears normal for degree of bladder distention.   Other:   None.   IMPRESSION: 1. 11 mm right superior pole cyst. This cyst does not correspond to size or location of CT lesion in question. This is indeterminate lesion can be further characterized with MRI.  EXAM: MRI ABDOMEN WITHOUT AND WITH CONTRAST   TECHNIQUE: Multiplanar multisequence MR imaging of the abdomen was performed both before and after the administration of intravenous contrast.   CONTRAST:  41m MULTIHANCE GADOBENATE DIMEGLUMINE 529 MG/ML IV SOLN   COMPARISON:  CT June 03, 2021 and ultrasound June 27, 2021   FINDINGS: Lower chest: No acute abnormality.  Stable cardiomegaly.   Hepatobiliary: No hepatic steatosis. No suspicious hepatic lesion the visualized portions of the liver. Gallbladder surgically absent. No biliary ductal dilation.   Pancreas: Intrinsic T1 signal of the pancreatic parenchyma is within normal limits. Homogeneous pancreatic enhancement. No pancreatic ductal dilation.   Spleen:  Within normal limits in size and appearance.   Adrenals/Urinary Tract: Bilateral adrenal glands appear normal. No hydronephrosis. Intrinsically T1 hyperintense 6 mm right upper pole renal lesion corresponding with the finding on prior CT does not demonstrate postcontrast  enhancement and is consistent with a hemorrhagic/proteinaceous cyst. Additional fluid density bilateral renal cystic lesions measuring up to 5 mm. No solid enhancing renal mass.   Stomach/Bowel: Colonic diverticulosis. No pathologic dilation or evidence of acute inflammation involving loops of large or small bowel in the abdomen.   Vascular/Lymphatic: Normal caliber abdominal aorta. No pathologically enlarged abdominal lymph nodes.   Other:  No significant abdominal free fluid.   Musculoskeletal: L4-S1 posterior spinal fusion hardware. No suspicious osseous lesion identified.   IMPRESSION: 1. Intrinsically T1 hyperintense 6 mm right upper pole renal lesion corresponding with the finding on prior CT does not demonstrate postcontrast enhancement and is consistent with a hemorrhagic/proteinaceous cyst. 2. Additional fluid density bilateral renal cystic lesions measuring up to 5 mm. 3. No solid enhancing renal mass. 4. Colonic diverticulosis.     Electronically Signed   By: JDahlia BailiffM.D.   On: 08/03/2021 11:33  Assessment And Plan:  1. Type 2 diabetes mellitus with stage 2 chronic kidney disease, with long-term current use of insulin (HCC) Comments: Chronic, diabetic foot exam was performed. I will check labs as below, she will c/w Ozempic and Toujeo, 28 units nightly. She will f/u in 3-4 months.  - Microalbumin / Creatinine Urine Ratio - CMP14+EGFR - Hemoglobin A1c  2. Hypertensive heart disease with chronic diastolic congestive heart failure (HCC) Comments: Chronic, fair control. Goal BP<130/80. Importance of dietary/medication compliance was d/w patient.   3. Ventral hernia without obstruction or gangrene Comments: CT scan and other imaging studies were reviewed in full detail. She denies having any abdominal discomfort at this time.   4. Recurrent depression ([place]) Comments: Chronic, sx exacerbated by stressful home environment. She feels her sx are stable at  this time.   5. Chronic respiratory failure with hypoxia (HCC) Comments: Chronic, encouraged to comply w/ use of supplemental oxygen.   6. Atherosclerotic heart disease of native coronary artery with other forms of angina pectoris ([place]) Comments: Chronic, she is encouraged to follow heart healthy lifestyle. She will c/w atorvastatin and carvedilol. Unable to tolerate ASA due to rectal bleeding in 2018.   7. Class 2 severe obesity due to excess calories with serious comorbidity and body mass index (BMI) of 36.0 to 36.9 in adult [facility]) Comments: She is encouraged to strive for BMI <30 to decrease cardiac risk. Advised to aim for at least 150 minutes of exercise per week.  8. Advanced care planning/counseling discussion Comments: MOST form reviewed. She had a copy of this with her during the appt. At this time, she prefers to have CPR, , full scope of treatment, abx if indicated and iv fluids/feeding tube. I spent more than 20 minutes in discussion with the patient.    Patient was given opportunity to ask questions. Patient verbalized understanding of the plan and was able to repeat key elements of the plan. All questions were answered to their satisfaction.   I, [name], MD, have reviewed all documentation for this visit. The documentation on 08/20/21 for the exam, diagnosis, procedures, and orders are all accurate and complete.   IF YOU HAVE BEEN REFERRED TO A SPECIALIST, IT MAY TAKE 1-2 WEEKS TO SCHEDULE/PROCESS THE REFERRAL. IF YOU HAVE NOT HEARD FROM US/SPECIALIST IN TWO WEEKS, PLEASE GIVE [place] A CALL AT [phone] X [phone].   THE PATIENT IS ENCOURAGED TO PRACTICE SOCIAL DISTANCING DUE TO THE COVID-19 PANDEMIC.

## 2021-08-18 LAB — CMP14+EGFR
ALT: 12 IU/L (ref 0–32)
AST: 19 IU/L (ref 0–40)
Albumin/Globulin Ratio: 1.3 (ref 1.2–2.2)
Albumin: 4.2 g/dL (ref 3.7–4.7)
Alkaline Phosphatase: 120 IU/L (ref 44–121)
BUN/Creatinine Ratio: 16 (ref 12–28)
BUN: 16 mg/dL (ref 8–27)
Bilirubin Total: 0.3 mg/dL (ref 0.0–1.2)
CO2: 25 mmol/L (ref 20–29)
Calcium: 9.2 mg/dL (ref 8.7–10.3)
Chloride: 99 mmol/L (ref 96–106)
Creatinine, Ser: 0.97 mg/dL (ref 0.57–1.00)
Globulin, Total: 3.2 g/dL (ref 1.5–4.5)
Glucose: 135 mg/dL — ABNORMAL HIGH (ref 70–99)
Potassium: 3.8 mmol/L (ref 3.5–5.2)
Sodium: 136 mmol/L (ref 134–144)
Total Protein: 7.4 g/dL (ref 6.0–8.5)
eGFR: 62 mL/min/{1.73_m2} (ref >59)

## 2021-08-18 LAB — MICROALBUMIN / CREATININE URINE RATIO
Creatinine, Urine: 123 mg/dL
Microalb/Creat Ratio: 7 mg/g creat (ref 0–29)
Microalbumin, Urine: 9.1 ug/mL

## 2021-08-18 LAB — HEMOGLOBIN A1C
Est. average glucose Bld gHb Est-mCnc: 171 mg/dL
Hgb A1c MFr Bld: 7.6 % — ABNORMAL HIGH (ref 4.8–5.6)

## 2021-08-24 ENCOUNTER — Telehealth: Payer: Self-pay

## 2021-08-24 NOTE — Chronic Care Management (AMB) (Signed)
? ? ?Chronic Care Management ?Pharmacy Assistant  ? ?Name: [name]  MRN: [id number] DOB: 11-07-1945 ? ?Reason for Encounter: Disease State/ Hypertension ? ?Recent office visits:  ?08-17-2021 [name], MD. Glucose= 135. A1C= 7.6. ? ?Recent consult visits:  ?08-01-2021 [name], FNP (Heart/vascular). Glucose= 110, BUN= 7 ? ?Hospital visits:  ?None in previous 6 months ? ?Medications: ?Outpatient Encounter Medications as of 08/24/2021  ?Medication Sig  ? acetaminophen (TYLENOL) 500 MG tablet Take 500-1,000 mg by mouth every 4 (four) hours as needed for moderate pain.   ? albuterol (VENTOLIN HFA) 108 (90 Base) MCG/ACT inhaler Inhale 1-2 puffs into the lungs every 6 (six) hours as needed for wheezing or shortness of breath.  ? Alcohol Swabs (B-D SINGLE USE SWABS REGULAR) PADS USE TWICE DAILY AS DIRECTED  ? Ascorbic Acid (VITAMIN C) 1000 MG tablet Take 1,000 mg by mouth daily.  ? atorvastatin (LIPITOR) 10 MG tablet TAKE 1 TABLET EVERY EVENING.  ? benzonatate (TESSALON PERLES) 100 MG capsule Take 1 capsule (100 mg total) by mouth 3 (three) times daily as needed for cough.  ? Blood Glucose Calibration (TRUE METRIX LEVEL 1) Low SOLN Use as directed  Dx:e11.22  ? Blood Glucose Monitoring Suppl (TRUE METRIX METER) w/Device KIT USE AS DIRECTED  ? carvedilol (COREG) 12.5 MG tablet Take 1 tablet (12.5 mg total) by mouth 2 (two) times daily.  ? dexlansoprazole (DEXILANT) 60 MG capsule Take 1 capsule (60 mg total) by mouth daily. (Patient taking differently: Take 60 mg by mouth every Monday, Wednesday, and Friday.)  ? diclofenac sodium (VOLTAREN) 1 % GEL Apply 2 g topically 4 (four) times daily as needed (pain).  ? ferrous sulfate 324 MG TBEC Patient takes 1 tablet by mouth every other day.  ? furosemide (LASIX) 80 MG tablet Patient takes 1 tablet by mouth daily and 1/2 tablet on Saturdays and Sundays.  ? Glucagon (GVOKE HYPOPEN 1-PACK) 0.5 MG/0.1ML SOAJ Inject 0.5 mg into the skin daily as needed.  ? glucose  blood (TRUE METRIX BLOOD GLUCOSE TEST) test strip USE AS DIRECTED TO CHECK BLOOD SUGARS 2 TIMES PER DAY  ? guaiFENesin-dextromethorphan (ROBITUSSIN DM) 100-10 MG/5ML syrup Take 10 mLs by mouth every 6 (six) hours as needed for cough.  ? hydrALAZINE (APRESOLINE) 100 MG tablet Take 1 tablet (100 mg total) by mouth 3 (three) times daily.  ? hydrocortisone cream 1 % Apply 1 application topically daily as needed for itching.   ? insulin lispro (HUMALOG KWIKPEN) 100 UNIT/ML KwikPen Inject 0.02-0.04 mLs (2-4 Units total) into the skin 3 (three) times daily as needed (high blood sugar over 150).  ? Insulin Pen Needle (B-D ULTRAFINE III SHORT PEN) 31G X 8 MM MISC USE DAILY WITH VICTOZA AND/OR NOVOLOG.  ? Isopropyl Alcohol (ALCOHOL WIPES) 70 % MISC Apply 1 each topically 2 (two) times daily.  ? isosorbide mononitrate (IMDUR) 30 MG 24 hr tablet TAKE 1 TABLET EVERY DAY (APPOINTMENT IS NEEDED FOR REFILLS)  ? lamoTRIgine (LAMICTAL) 150 MG tablet Take 1 tablet (150 mg total) by mouth 2 (two) times daily.  ? levETIRAcetam (KEPPRA) 500 MG tablet Take 1,000 mg by mouth at bedtime.  ? ondansetron (ZOFRAN) 4 MG tablet Take 4 mg by mouth every 8 (eight) hours as needed for nausea or vomiting.  ? OZEMPIC, 1 MG/DOSE, 4 MG/3ML SOPN INJECT 1 MG INTO THE SKIN ONCE A WEEK.  ? Pancrelipase, Lip-Prot-Amyl, 25000-79000 units CPEP Take 1-2 capsules by mouth See admin instructions. Take 1 capsules in the morning  with breakfast, 1 capsule with lunch, and 2 capsules with supper  ? potassium chloride SA (KLOR-CON) 20 MEQ tablet TAKE 2 TABLETS EVERY DAY BUT WHEN TAKING METOLAZONE TAKE 3 TABLETS DAILY AS DIRECTED  ? sennosides-docusate sodium (SENOKOT-S) 8.6-50 MG tablet Take 1-2 tablets by mouth at bedtime as needed for constipation.  ? sertraline (ZOLOFT) 50 MG tablet TAKE 1 TABLET EVERY DAY  ? tiotropium (SPIRIVA HANDIHALER) 18 MCG inhalation capsule Place 1 capsule (18 mcg total) into inhaler and inhale daily.  ? tolnaftate (TINACTIN) 1 % cream  Apply 1 application topically daily as needed (foot fungus).   ? TOUJEO SOLOSTAR 300 UNIT/ML Solostar Pen INJECT 36 UNITS INTO THE SKIN AT BEDTIME. (Patient taking differently: 25 Units.)  ? TRUEplus Lancets 33G MISC Use as directed to check blood sugars 2 times per day dx: e11.22  ? ?No facility-administered encounter medications on file as of 08/24/2021.  ? ?Reviewed chart prior to disease state call. Spoke with patient regarding BP ? ?Recent Office Vitals: ?BP Readings from Last 3 Encounters:  ?08/17/21 134/70  ?08/01/21 136/70  ?07/18/21 122/60  ? ?Pulse Readings from Last 3 Encounters:  ?08/17/21 74  ?08/01/21 73  ?07/18/21 66  ?  ?Wt Readings from Last 3 Encounters:  ?08/17/21 193 lb 6.4 oz (87.7 kg)  ?08/01/21 196 lb 3.2 oz (89 kg)  ?07/18/21 189 lb 12.8 oz (86.1 kg)  ?  ? ?Kidney Function ?Lab Results  ?Component Value Date/Time  ? CREATININE 0.97 08/17/2021 12:17 PM  ? CREATININE 0.80 08/01/2021 10:58 AM  ? CREATININE 0.91 10/04/2016 11:44 AM  ? GFR 86.68 05/15/2016 01:35 PM  ? GFRNONAA >60 08/01/2021 10:58 AM  ? GFRNONAA 65 10/04/2016 11:44 AM  ? GFRAA 75 06/06/2020 04:00 PM  ? GFRAA 75 10/04/2016 11:44 AM  ? ? ?BMP Latest Ref Rng & Units 08/17/2021 08/01/2021 05/04/2021  ?Glucose 70 - 99 mg/dL 135(H) 110(H) 99  ?BUN 8 - 27 mg/dL 16 7(L) 14  ?Creatinine 0.57 - 1.00 mg/dL 0.97 0.80 0.85  ?BUN/Creat Ratio 12 - 28 16 - -  ?Sodium 134 - 144 mmol/L 136 136 138  ?Potassium 3.5 - 5.2 mmol/L 3.8 4.2 3.9  ?Chloride 96 - 106 mmol/L 99 102 101  ?CO2 20 - 29 mmol/L 25 27 29   ?Calcium 8.7 - 10.3 mg/dL 9.2 9.1 9.2  ? ? ?08-24-2021: 1st attempt left VM ?08-30-2021: 2nd attempt left VM ?09-04-2021: 3rd attempt left VM ? ?Care Gaps: ?Yearly foot exam overdue ?Yearly mammogram overdue ?AWV 07-18-2022 ? ?Star Rating Drugs: ?Atorvastatin 10 mg- Last filled 07-28-2021 [facility] ?Ozempic 1 mg- Last filled 08-04-2021 [facility] ? ?Malecca Hicks CMA ?Clinical Pharmacist Assistant ?[phone] ? ?

## 2021-09-06 ENCOUNTER — Ambulatory Visit (INDEPENDENT_AMBULATORY_CARE_PROVIDER_SITE_OTHER): Payer: Medicare HMO | Admitting: Nurse Practitioner

## 2021-09-06 ENCOUNTER — Other Ambulatory Visit: Payer: Self-pay

## 2021-09-06 ENCOUNTER — Encounter: Payer: Self-pay | Admitting: Nurse Practitioner

## 2021-09-06 VITALS — BP 116/80 | HR 76 | Temp 99.2°F | Ht 61.0 in | Wt 191.0 lb

## 2021-09-06 DIAGNOSIS — E1165 Type 2 diabetes mellitus with hyperglycemia: Secondary | ICD-10-CM | POA: Diagnosis not present

## 2021-09-06 DIAGNOSIS — Z794 Long term (current) use of insulin: Secondary | ICD-10-CM | POA: Diagnosis not present

## 2021-09-06 DIAGNOSIS — E1122 Type 2 diabetes mellitus with diabetic chronic kidney disease: Secondary | ICD-10-CM

## 2021-09-06 DIAGNOSIS — N182 Chronic kidney disease, stage 2 (mild): Secondary | ICD-10-CM

## 2021-09-06 DIAGNOSIS — J410 Simple chronic bronchitis: Secondary | ICD-10-CM | POA: Diagnosis not present

## 2021-09-06 MED ORDER — TOUJEO SOLOSTAR 300 UNIT/ML ~~LOC~~ SOPN: 25.0000 [IU] | PEN_INJECTOR | Freq: Every day | SUBCUTANEOUS | 1 refills | Status: DC

## 2021-09-06 MED ORDER — BREZTRI AEROSPHERE 160-9-4.8 MCG/ACT IN AERO
2.0000 | INHALATION_SPRAY | Freq: Two times a day (BID) | RESPIRATORY_TRACT | 2 refills | Status: DC
Start: 2021-09-06 — End: 2021-09-25

## 2021-09-06 NOTE — Progress Notes (Signed)
?Rich Brave Llittleton,acting as a Education administrator for [employer], FNP.,have documented all relevant documentation on the behalf of Minette Brine, FNP,as directed by  Minette Brine, FNP while in the presence of Minette Brine, Datil.  ?This visit occurred during the SARS-CoV-2 public health emergency.  Safety protocols were in place, including screening questions prior to the visit, additional usage of staff PPE, and extensive cleaning of exam room while observing appropriate contact time as indicated for disinfecting solutions. ? ?Subjective:  ?  ? Patient ID: [name] , female    DOB: 08/03/1945 , 76 y.o.   MRN: [id number] ? ? ?Chief Complaint  ?Patient presents with  ? URI  ? ? ?HPI ? ?Patient presents today for a cough, she feels nothing unusual, she lives with her sister and she had neck surgery and her family has wanted her to come in to be seen. Cough wakes her sleep, does not feel her swelling to her feet are any different ? ?Wt Readings from Last 3 Encounters: ?09/06/21 : 191 lb (86.6 kg) ?08/17/21 : 193 lb 6.4 oz (87.7 kg) ?08/01/21 : 196 lb 3.2 oz (89 kg) ?  ? ? ?URI  ?This is a new problem. The problem has been unchanged. There has been no fever. Associated symptoms include congestion, coughing and sneezing. She has tried nothing for the symptoms.   ? ?Past Medical History:  ?Diagnosis Date  ? Abnormal liver function   ?  in the past.  ? Anemia   ? Arthritis   ? all over  ? Bruises easily   ? Cataract   ? right eye;immature  ? CHF (congestive heart failure) ([place])   ? Chronic back pain   ? stenosis  ? Chronic cough   ? Chronic kidney disease   ? COPD (chronic obstructive pulmonary disease) ([place])   ? COVID   ? aug/sept 2021  ? Demand myocardial infarction [facility]) 2012  ? Demand Infarction in setting of Pancreatitis --> mild Troponin elevation, NON-OBSTRUCTIVE CAD  ? Depression   ? takes Abilify daily as well as Zoloft  ? Diastolic heart failure 5885  ? Grade 1 diastolic Dysfunction by Echo   ? Diverticulosis   ?  DM (diabetes mellitus) ([place])   ? takes Victoza daily as well as Lantus and Humalog  ? Empty sella ([place])   ? on MRI in 2009.  ? Glaucoma   ? Headache(784.0)   ? last migraine-4-29yr ago  ? History of blood transfusion   ? no abnormal reaction noted  ? History of colon polyps   ? benign  ? HTN (hypertension)   ? takes BKinder Morgan Energydaily  ? Hyperlipidemia   ? takes Lipitor daily  ? Joint swelling   ? Nocturia   ? Ob[place]   ? Obstructive sleep apnea 02/2018  ? Notably improved split-night study with weight loss from 270 (2017) down to 250 pounds (2019).  ? Pancreatitis   ? takes Pancrelipase daily  ? [place]   ? takes Sinemet daily  ? Peripheral neuropathy   ? Pneumonia 2012  ? [place]iz[place]   ? takes Lamictal daily and Primidone nightly;last seizure 2wks ago  ? Urinary urgency   ? With increased frequency  ? Varicose veins of both lower extremities with pain   ? With edema.  Takes daily Lasix  ?  ? ?Family History  ?Problem Relation Age of Onset  ? Allergies Father   ? Heart disease Father   ?  before 60  ? Heart failure Father   ? Hypertension Father   ? Hyperlipidemia Father   ? Heart disease Mother   ? Diabetes Mother   ? Hypertension Mother   ? Hyperlipidemia Mother   ? Hypertension Sister   ? Heart disease Sister   ?     before 60  ? Hyperlipidemia Sister   ? Diabetes Brother   ? Hypertension Brother   ? Diabetes Sister   ? Hypertension Sister   ? Diabetes Son   ? Hypertension Son   ? Cancer Other   ? ? ? ?Current Outpatient Medications:  ?  acetaminophen (TYLENOL) 500 MG tablet, Take 500-1,000 mg by mouth every 4 (four) hours as needed for moderate pain. , Disp: , Rfl:  ?  Alcohol Swabs (B-D SINGLE USE SWABS REGULAR) PADS, USE TWICE DAILY AS DIRECTED, Disp: 200 each, Rfl: 6 ?  atorvastatin (LIPITOR) 10 MG tablet, TAKE 1 TABLET EVERY EVENING., Disp: 90 tablet, Rfl: 2 ?  benzonatate (TESSALON PERLES) 100 MG capsule, Take 1 capsule (100 mg total) by  mouth 3 (three) times daily as needed for cough., Disp: 30 capsule, Rfl: 1 ?  Blood Glucose Calibration (TRUE METRIX LEVEL 1) Low SOLN, Use as directed  Dx:e11.22, Disp: 1 each, Rfl: 2 ?  Blood Glucose Monitoring Suppl (TRUE METRIX METER) w/Device KIT, USE AS DIRECTED, Disp: 1 kit, Rfl: 1 ?  Budeson-Glycopyrrol-Formoterol (BREZTRI AEROSPHERE) 160-9-4.8 MCG/ACT AERO, Inhale 2 puffs into the lungs in the morning and at bedtime., Disp: 10.7 g, Rfl: 2 ?  carvedilol (COREG) 12.5 MG tablet, Take 1 tablet (12.5 mg total) by mouth 2 (two) times daily., Disp: 180 tablet, Rfl: 3 ?  dexlansoprazole (DEXILANT) 60 MG capsule, Take 1 capsule (60 mg total) by mouth daily. (Patient taking differently: Take 60 mg by mouth every Monday, Wednesday, and Friday.), Disp: 90 capsule, Rfl: 2 ?  diclofenac sodium (VOLTAREN) 1 % GEL, Apply 2 g topically 4 (four) times daily as needed (pain)., Disp: , Rfl:  ?  ferrous sulfate 324 MG TBEC, Patient takes 1 tablet by mouth every other day., Disp: , Rfl:  ?  furosemide (LASIX) 80 MG tablet, Patient takes 1 tablet by mouth daily and 1/2 tablet on Saturdays and Sundays., Disp: , Rfl:  ?  Glucagon (GVOKE HYPOPEN 1-PACK) 0.5 MG/0.1ML SOAJ, Inject 0.5 mg into the skin daily as needed., Disp: 0.1 mL, Rfl: 11 ?  glucose blood (TRUE METRIX BLOOD GLUCOSE TEST) test strip, USE AS DIRECTED TO CHECK BLOOD SUGARS 2 TIMES PER DAY, Disp: 200 strip, Rfl: 2 ?  guaiFENesin-dextromethorphan (ROBITUSSIN DM) 100-10 MG/5ML syrup, Take 10 mLs by mouth every 6 (six) hours as needed for cough., Disp: 118 mL, Rfl: 0 ?  hydrocortisone cream 1 %, Apply 1 application topically daily as needed for itching. , Disp: , Rfl:  ?  insulin lispro (HUMALOG KWIKPEN) 100 UNIT/ML KwikPen, Inject 0.02-0.04 mLs (2-4 Units total) into the skin 3 (three) times daily as needed (high blood sugar over 150)., Disp: 15 mL, Rfl: 2 ?  Insulin Pen Needle (B-D ULTRAFINE III SHORT PEN) 31G X 8 MM MISC, USE DAILY WITH VICTOZA AND/OR NOVOLOG., Disp:  400 each, Rfl: 2 ?  Isopropyl Alcohol (ALCOHOL WIPES) 70 % MISC, Apply 1 each topically 2 (two) times daily., Disp: 300 each, Rfl: 3 ?  isosorbide mononitrate (IMDUR) 30 MG 24 hr tablet, TAKE 1 TABLET EVERY DAY (APPOINTMENT IS NEEDED FOR REFILLS), Disp: 90 tablet, Rfl: 2 ?  levETIRAcetam (KEPPRA) 500 MG  tablet, Take 1,000 mg by mouth at bedtime., Disp: , Rfl:  ?  ondansetron (ZOFRAN) 4 MG tablet, Take 4 mg by mouth every 8 (eight) hours as needed for nausea or vomiting., Disp: , Rfl:  ?  OZEMPIC, 1 MG/DOSE, 4 MG/3ML SOPN, INJECT 1 MG INTO THE SKIN ONCE A WEEK., Disp: 9 mL, Rfl: 3 ?  Pancrelipase, Lip-Prot-Amyl, 25000-79000 units CPEP, Take 1-2 capsules by mouth See admin instructions. Take 1 capsules in the morning with breakfast, 1 capsule with lunch, and 2 capsules with supper, Disp: , Rfl:  ?  potassium chloride SA (KLOR-CON) 20 MEQ tablet, TAKE 2 TABLETS EVERY DAY BUT WHEN TAKING METOLAZONE TAKE 3 TABLETS DAILY AS DIRECTED, Disp: 204 tablet, Rfl: 2 ?  sennosides-docusate sodium (SENOKOT-S) 8.6-50 MG tablet, Take 1-2 tablets by mouth at bedtime as needed for constipation., Disp: , Rfl:  ?  sertraline (ZOLOFT) 50 MG tablet, TAKE 1 TABLET EVERY DAY, Disp: 90 tablet, Rfl: 2 ?  tolnaftate (TINACTIN) 1 % cream, Apply 1 application topically daily as needed (foot fungus). , Disp: , Rfl:  ?  TRUEplus Lancets 33G MISC, Use as directed to check blood sugars 2 times per day dx: e11.22, Disp: 300 each, Rfl: 2 ?  albuterol (VENTOLIN HFA) 108 (90 Base) MCG/ACT inhaler, Inhale 1-2 puffs into the lungs every 6 (six) hours as needed for wheezing or shortness of breath., Disp: 18 g, Rfl: 5 ?  Ascorbic Acid (VITAMIN C) 1000 MG tablet, Take 1,000 mg by mouth daily., Disp: , Rfl:  ?  hydrALAZINE (APRESOLINE) 100 MG tablet, Take 1 tablet (100 mg total) by mouth 3 (three) times daily., Disp: 270 tablet, Rfl: 3 ?  insulin glargine, 1 Unit Dial, (TOUJEO SOLOSTAR) 300 UNIT/ML Solostar Pen, Inject 25 Units into the skin daily., Disp: 4.5  mL, Rfl: 1 ?  lamoTRIgine (LAMICTAL) 150 MG tablet, Take 1 tablet (150 mg total) by mouth 2 (two) times daily., Disp: 180 tablet, Rfl: 3  ? ?Allergies  ?Allergen Reactions  ? Other Anaphylaxis and Rash

## 2021-09-06 NOTE — Patient Instructions (Signed)
Collagen helps with loose skin  ?

## 2021-09-12 ENCOUNTER — Ambulatory Visit: Payer: Medicare HMO | Admitting: Neurology

## 2021-09-12 ENCOUNTER — Other Ambulatory Visit: Payer: Self-pay

## 2021-09-12 ENCOUNTER — Encounter: Payer: Self-pay | Admitting: Neurology

## 2021-09-12 VITALS — BP 112/61 | HR 69 | Ht 61.0 in | Wt 193.2 lb

## 2021-09-12 DIAGNOSIS — G40009 Localization-related (focal) (partial) idiopathic epilepsy and epileptic syndromes with seizures of localized onset, not intractable, without status epilepticus: Secondary | ICD-10-CM

## 2021-09-12 MED ORDER — LAMOTRIGINE 150 MG PO TABS: 150.0000 mg | ORAL_TABLET | Freq: Two times a day (BID) | ORAL | 3 refills | Status: DC

## 2021-09-12 NOTE — Patient Instructions (Signed)
Good to see you looking good.  ? ?Continue Lamotrigine '150mg'$ : take 1 tablet twice a day ? ?2. Continue Levetiracetam '500mg'$ : take 2 tablets every night. Let me know when you need refills. ? ?3. Follow-up in 6 months, call for any changes ? ? ?Seizure Precautions: ?1. If medication has been prescribed for you to prevent seizures, take it exactly as directed.  Do not stop taking the medicine without talking to your doctor first, even if you have not had a seizure in a long time.  ? ?2. Avoid activities in which a seizure would cause danger to yourself or to others.  Don't operate dangerous machinery, swim alone, or climb in high or dangerous places, such as on ladders, roofs, or girders.  Do not drive unless your doctor says you may. ? ?3. If you have any warning that you may have a seizure, lay down in a safe place where you can't hurt yourself.   ? ?4.  No driving for 6 months from last seizure, as per [facility].   Please refer to the following link on the [employer] website for more information: [email or web address]  ? ?5.  Maintain good sleep hygiene. Avoid alcohol. ? ?6.  Contact your doctor if you have any problems that may be related to the medicine you are taking. ? ?7.  Call 911 and bring the patient back to the ED if: ?      ? A.  The seizure lasts longer than 5 minutes.      ? B.  The patient doesn't awaken shortly after the seizure ? C.  The patient has new problems such as difficulty seeing, speaking or moving ? D.  The patient was injured during the seizure ? E.  The patient has a temperature over 102 F (39C) ? F.  The patient vomited and now is having trouble breathing ?      ? ?
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
## 2021-09-21 ENCOUNTER — Other Ambulatory Visit (HOSPITAL_COMMUNITY): Payer: Self-pay

## 2021-09-21 MED ORDER — POTASSIUM CHLORIDE CRYS ER 20 MEQ PO TBCR: EXTENDED_RELEASE_TABLET | ORAL | 2 refills | Status: DC

## 2021-09-22 ENCOUNTER — Other Ambulatory Visit: Payer: Self-pay | Admitting: Cardiology

## 2021-09-22 ENCOUNTER — Other Ambulatory Visit: Payer: Self-pay | Admitting: Internal Medicine

## 2021-09-22 DIAGNOSIS — F339 Major depressive disorder, recurrent, unspecified: Secondary | ICD-10-CM

## 2021-09-25 ENCOUNTER — Other Ambulatory Visit: Payer: Self-pay

## 2021-09-25 DIAGNOSIS — J410 Simple chronic bronchitis: Secondary | ICD-10-CM

## 2021-09-25 MED ORDER — BREZTRI AEROSPHERE 160-9-4.8 MCG/ACT IN AERO: 2.0000 | INHALATION_SPRAY | Freq: Two times a day (BID) | RESPIRATORY_TRACT | 2 refills | Status: DC

## 2021-09-25 NOTE — Telephone Encounter (Signed)
I called the patient to see if her cardiologist has been managing her furosemide '80mg'$  prescription refills and to verify the directions for the medication.  ?

## 2021-09-26 ENCOUNTER — Telehealth: Payer: Self-pay

## 2021-09-26 NOTE — Telephone Encounter (Signed)
The patient stated that her cardiologist Dr. Marigene Ehlers does her refills on her lasix and she takes 1 tab Monday - Friday and 1/2 tab on Saturday - Sunday. ?

## 2021-09-27 ENCOUNTER — Telehealth: Payer: Self-pay

## 2021-09-27 NOTE — Telephone Encounter (Signed)
Medication refilled by [facility] doctor ?

## 2021-09-27 NOTE — Chronic Care Management (AMB) (Signed)
? ? ?Chronic Care Management ?Pharmacy Assistant  ? ?Name: [name]  MRN: [id number] DOB: [birth year] ? ? ?Reason for Encounter: Disease State/ Diabetes ? ?Recent office visits:  ?09-06-2021 Minette Brine, [place]. STOP tiotropium inhaler. DECREASE toujeo 36 units daily TO 25 units daily. START breztri inhale 2 puffs in the morning and at bedtime. ? ?08-17-2021 [name], MD. Glucose= 135. A1C= 7.6 ? ?Recent consult visits:  ?09-12-2021 Cameron Sprang, MD (Neurology). No changes. Follow up in 6 months. ? ?Hospital visits:  ?None in previous 6 months ? ?Medications: ?Outpatient Encounter Medications as of 09/27/2021  ?Medication Sig  ? acetaminophen (TYLENOL) 500 MG tablet Take 500-1,000 mg by mouth every 4 (four) hours as needed for moderate pain.   ? albuterol (VENTOLIN HFA) 108 (90 Base) MCG/ACT inhaler Inhale 1-2 puffs into the lungs every 6 (six) hours as needed for wheezing or shortness of breath.  ? Alcohol Swabs (DROPSAFE ALCOHOL PREP) 70 % PADS USE TWO TIMES DAILY AS DIRECTED  ? Ascorbic Acid (VITAMIN C) 1000 MG tablet Take 1,000 mg by mouth daily.  ? atorvastatin (LIPITOR) 10 MG tablet TAKE 1 TABLET EVERY EVENING.  ? B-D ULTRAFINE III SHORT PEN 31G X 8 MM MISC USE DAILY WITH VICTOZA AND/OR NOVOLOG.  ? benzonatate (TESSALON PERLES) 100 MG capsule Take 1 capsule (100 mg total) by mouth 3 (three) times daily as needed for cough.  ? Blood Glucose Calibration (TRUE METRIX LEVEL 1) Low SOLN Use as directed  Dx:e11.22  ? Blood Glucose Monitoring Suppl (TRUE METRIX METER) w/Device KIT USE AS DIRECTED  ? Budeson-Glycopyrrol-Formoterol (BREZTRI AEROSPHERE) 160-9-4.8 MCG/ACT AERO Inhale 2 puffs into the lungs in the morning and at bedtime.  ? carvedilol (COREG) 12.5 MG tablet Take 1 tablet (12.5 mg total) by mouth 2 (two) times daily.  ? dexlansoprazole (DEXILANT) 60 MG capsule Take 1 capsule (60 mg total) by mouth daily. (Patient taking differently: Take 60 mg by mouth every Monday, Wednesday, and Friday.)  ?  diclofenac sodium (VOLTAREN) 1 % GEL Apply 2 g topically 4 (four) times daily as needed (pain).  ? ferrous sulfate 324 MG TBEC Patient takes 1 tablet by mouth every other day.  ? furosemide (LASIX) 80 MG tablet Patient takes 1 tablet by mouth daily and 1/2 tablet on Saturdays and Sundays.  ? Glucagon (GVOKE HYPOPEN 1-PACK) 0.5 MG/0.1ML SOAJ Inject 0.5 mg into the skin daily as needed.  ? glucose blood (TRUE METRIX BLOOD GLUCOSE TEST) test strip USE AS DIRECTED TO CHECK BLOOD SUGARS 2 TIMES PER DAY  ? guaiFENesin-dextromethorphan (ROBITUSSIN DM) 100-10 MG/5ML syrup Take 10 mLs by mouth every 6 (six) hours as needed for cough.  ? hydrALAZINE (APRESOLINE) 100 MG tablet Take 1 tablet (100 mg total) by mouth 3 (three) times daily.  ? hydrocortisone cream 1 % Apply 1 application topically daily as needed for itching.   ? insulin glargine, 1 Unit Dial, (TOUJEO SOLOSTAR) 300 UNIT/ML Solostar Pen Inject 25 Units into the skin daily.  ? insulin lispro (HUMALOG KWIKPEN) 100 UNIT/ML KwikPen Inject 0.02-0.04 mLs (2-4 Units total) into the skin 3 (three) times daily as needed (high blood sugar over 150).  ? Isopropyl Alcohol (ALCOHOL WIPES) 70 % MISC Apply 1 each topically 2 (two) times daily.  ? isosorbide mononitrate (IMDUR) 30 MG 24 hr tablet TAKE 1 TABLET EVERY DAY (APPOINTMENT IS NEEDED FOR REFILLS)  ? lamoTRIgine (LAMICTAL) 150 MG tablet Take 1 tablet (150 mg total) by mouth 2 (two) times daily.  ? levETIRAcetam (KEPPRA)  500 MG tablet Take 1,000 mg by mouth at bedtime.  ? ondansetron (ZOFRAN) 4 MG tablet Take 4 mg by mouth every 8 (eight) hours as needed for nausea or vomiting.  ? OZEMPIC, 1 MG/DOSE, 4 MG/3ML SOPN INJECT 1 MG INTO THE SKIN ONCE A WEEK.  ? Pancrelipase, Lip-Prot-Amyl, 25000-79000 units CPEP Take 1-2 capsules by mouth See admin instructions. Take 1 capsules in the morning with breakfast, 1 capsule with lunch, and 2 capsules with supper  ? potassium chloride SA (KLOR-CON M) 20 MEQ tablet Take 2 tablets daily  on Days taking Metolazone take 3 tablets  ? sennosides-docusate sodium (SENOKOT-S) 8.6-50 MG tablet Take 1-2 tablets by mouth at bedtime as needed for constipation.  ? sertraline (ZOLOFT) 50 MG tablet TAKE 1 TABLET EVERY DAY  ? tolnaftate (TINACTIN) 1 % cream Apply 1 application topically daily as needed (foot fungus).   ? TRUEplus Lancets 33G MISC Use as directed to check blood sugars 2 times per day dx: e11.22  ? ?No facility-administered encounter medications on file as of 09/27/2021.  ?Recent Relevant Labs: ?Lab Results  ?Component Value Date/Time  ? HGBA1C 7.6 (H) 08/17/2021 12:17 PM  ? HGBA1C 7.3 (H) 03/27/2021 03:36 PM  ? MICROALBUR 10 06/06/2020 01:23 PM  ? MICROALBUR 10 01/06/2020 12:16 PM  ?  ?Kidney Function ?Lab Results  ?Component Value Date/Time  ? CREATININE 0.97 08/17/2021 12:17 PM  ? CREATININE 0.80 08/01/2021 10:58 AM  ? CREATININE 0.91 10/04/2016 11:44 AM  ? GFR 86.68 05/15/2016 01:35 PM  ? GFRNONAA >60 08/01/2021 10:58 AM  ? GFRNONAA 65 10/04/2016 11:44 AM  ? GFRAA 75 06/06/2020 04:00 PM  ? GFRAA 75 10/04/2016 11:44 AM  ? ? ? ?09-27-2021: 1st attempt left VM ?09-29-2021: 2nd attempt left VM ?10-03-2021: 3rd attempt left VM ? ?Care Gaps: ?Mammogram overdue ?2nd shingrix overdue ?AWV 07-18-2022 ? ?Star Rating Drugs: ?Atorvastatin 10 mg- Last filled 07-28-2021 90 [facility] ?Ozempic 1 mg- Last filled 08-04-2021 84 [facility] ? ?Malecca Hicks CMA ?Clinical Pharmacist Assistant ?[phone] ? ?

## 2021-10-24 ENCOUNTER — Telehealth: Payer: Self-pay

## 2021-10-24 NOTE — Chronic Care Management (AMB) (Signed)
? ? ?Chronic Care Management ?Pharmacy Assistant  ? ?Name: [name]  MRN: [id number] DOB: [birth year] ? ?Reason for Encounter: Disease State/ Hypertension ? ?Recent office visits:  ?09-06-2021 Minette Brine, [place]. START Breztri Inhale 2 puffs into the lungs in the morning and at bedtime. DECREASE Toujeo 36 units daily TO 25 units daily. STOP Spiriva. ? ?08-17-2021 [name], [place]. Glucose= 135. A1C= 7.6. ? ?Recent consult visits:  ?09-12-2021 Cameron Sprang, MD (Neurology). Follow up for epilepsy. No changes. ? ?08-01-2021 [name], FNP (Cardiology). Glucose= 110, BUN= 7. ? ?Hospital visits:  ?None in previous 6 months ? ?Medications: ?Outpatient Encounter Medications as of 10/24/2021  ?Medication Sig  ? acetaminophen (TYLENOL) 500 MG tablet Take 500-1,000 mg by mouth every 4 (four) hours as needed for moderate pain.   ? albuterol (VENTOLIN HFA) 108 (90 Base) MCG/ACT inhaler Inhale 1-2 puffs into the lungs every 6 (six) hours as needed for wheezing or shortness of breath.  ? Alcohol Swabs (DROPSAFE ALCOHOL PREP) 70 % PADS USE TWO TIMES DAILY AS DIRECTED  ? Ascorbic Acid (VITAMIN C) 1000 MG tablet Take 1,000 mg by mouth daily.  ? atorvastatin (LIPITOR) 10 MG tablet TAKE 1 TABLET EVERY EVENING.  ? B-D ULTRAFINE III SHORT PEN 31G X 8 MM MISC USE DAILY WITH VICTOZA AND/OR NOVOLOG.  ? benzonatate (TESSALON PERLES) 100 MG capsule Take 1 capsule (100 mg total) by mouth 3 (three) times daily as needed for cough.  ? Blood Glucose Calibration (TRUE METRIX LEVEL 1) Low SOLN Use as directed  Dx:e11.22  ? Blood Glucose Monitoring Suppl (TRUE METRIX METER) w/Device KIT USE AS DIRECTED  ? Budeson-Glycopyrrol-Formoterol (BREZTRI AEROSPHERE) 160-9-4.8 MCG/ACT AERO Inhale 2 puffs into the lungs in the morning and at bedtime.  ? carvedilol (COREG) 12.5 MG tablet Take 1 tablet (12.5 mg total) by mouth 2 (two) times daily.  ? dexlansoprazole (DEXILANT) 60 MG capsule Take 1 capsule (60 mg total) by mouth daily. (Patient  taking differently: Take 60 mg by mouth every Monday, Wednesday, and Friday.)  ? diclofenac sodium (VOLTAREN) 1 % GEL Apply 2 g topically 4 (four) times daily as needed (pain).  ? ferrous sulfate 324 MG TBEC Patient takes 1 tablet by mouth every other day.  ? furosemide (LASIX) 80 MG tablet Patient takes 1 tablet by mouth daily and 1/2 tablet on Saturdays and Sundays.  ? Glucagon (GVOKE HYPOPEN 1-PACK) 0.5 MG/0.1ML SOAJ Inject 0.5 mg into the skin daily as needed.  ? glucose blood (TRUE METRIX BLOOD GLUCOSE TEST) test strip USE AS DIRECTED TO CHECK BLOOD SUGARS 2 TIMES PER DAY  ? guaiFENesin-dextromethorphan (ROBITUSSIN DM) 100-10 MG/5ML syrup Take 10 mLs by mouth every 6 (six) hours as needed for cough.  ? hydrALAZINE (APRESOLINE) 100 MG tablet Take 1 tablet (100 mg total) by mouth 3 (three) times daily.  ? hydrocortisone cream 1 % Apply 1 application topically daily as needed for itching.   ? insulin glargine, 1 Unit Dial, (TOUJEO SOLOSTAR) 300 UNIT/ML Solostar Pen Inject 25 Units into the skin daily.  ? insulin lispro (HUMALOG KWIKPEN) 100 UNIT/ML KwikPen Inject 0.02-0.04 mLs (2-4 Units total) into the skin 3 (three) times daily as needed (high blood sugar over 150).  ? Isopropyl Alcohol (ALCOHOL WIPES) 70 % MISC Apply 1 each topically 2 (two) times daily.  ? isosorbide mononitrate (IMDUR) 30 MG 24 hr tablet TAKE 1 TABLET EVERY DAY (APPOINTMENT IS NEEDED FOR REFILLS)  ? lamoTRIgine (LAMICTAL) 150 MG tablet Take 1 tablet (150 mg  total) by mouth 2 (two) times daily.  ? levETIRAcetam (KEPPRA) 500 MG tablet Take 1,000 mg by mouth at bedtime.  ? ondansetron (ZOFRAN) 4 MG tablet Take 4 mg by mouth every 8 (eight) hours as needed for nausea or vomiting.  ? OZEMPIC, 1 MG/DOSE, 4 MG/3ML SOPN INJECT 1 MG INTO THE SKIN ONCE A WEEK.  ? Pancrelipase, Lip-Prot-Amyl, 25000-79000 units CPEP Take 1-2 capsules by mouth See admin instructions. Take 1 capsules in the morning with breakfast, 1 capsule with lunch, and 2 capsules with  supper  ? potassium chloride SA (KLOR-CON M) 20 MEQ tablet Take 2 tablets daily on Days taking Metolazone take 3 tablets  ? sennosides-docusate sodium (SENOKOT-S) 8.6-50 MG tablet Take 1-2 tablets by mouth at bedtime as needed for constipation.  ? sertraline (ZOLOFT) 50 MG tablet TAKE 1 TABLET EVERY DAY  ? tolnaftate (TINACTIN) 1 % cream Apply 1 application topically daily as needed (foot fungus).   ? TRUEplus Lancets 33G MISC Use as directed to check blood sugars 2 times per day dx: e11.22  ? ?No facility-administered encounter medications on file as of 10/24/2021.  ?Reviewed chart prior to disease state call. Spoke with patient regarding BP ? ?Recent Office Vitals: ?BP Readings from Last 3 Encounters:  ?09/12/21 112/61  ?09/06/21 116/80  ?08/17/21 134/70  ? ?Pulse Readings from Last 3 Encounters:  ?09/12/21 69  ?09/06/21 76  ?08/17/21 74  ?  ?Wt Readings from Last 3 Encounters:  ?09/12/21 193 lb 3.2 oz (87.6 kg)  ?09/06/21 191 lb (86.6 kg)  ?08/17/21 193 lb 6.4 oz (87.7 kg)  ?  ? ?Kidney Function ?Lab Results  ?Component Value Date/Time  ? CREATININE 0.97 08/17/2021 12:17 PM  ? CREATININE 0.80 08/01/2021 10:58 AM  ? CREATININE 0.91 10/04/2016 11:44 AM  ? GFR 86.68 05/15/2016 01:35 PM  ? GFRNONAA >60 08/01/2021 10:58 AM  ? GFRNONAA 65 10/04/2016 11:44 AM  ? GFRAA 75 06/06/2020 04:00 PM  ? GFRAA 75 10/04/2016 11:44 AM  ? ? ? ?  Latest Ref Rng & Units 08/17/2021  ? 12:17 PM 08/01/2021  ? 10:58 AM 05/04/2021  ? 10:14 AM  ?BMP  ?Glucose 70 - 99 mg/dL 135   110   99    ?BUN 8 - 27 mg/dL 16   7   14     ?Creatinine 0.57 - 1.00 mg/dL 0.97   0.80   0.85    ?BUN/Creat Ratio 12 - 28 16      ?Sodium 134 - 144 mmol/L 136   136   138    ?Potassium 3.5 - 5.2 mmol/L 3.8   4.2   3.9    ?Chloride 96 - 106 mmol/L 99   102   101    ?CO2 20 - 29 mmol/L 25   27   29     ?Calcium 8.7 - 10.3 mg/dL 9.2   9.1   9.2    ? ? ?Current antihypertensive regimen:  ?Carvedilol 12.5 mg 1 tablet twice daily ?Hydralazine 100 mg 1 tablet 3 times  daily ?Isosorbide  mononitrate 20 mg daily ? ?How often are you checking your Blood Pressure? daily ? ?Current home BP readings: 128/55 66, 134/70 62, 140/63 62 ? ?What recent interventions/DTPs have been made by any provider to improve Blood Pressure control since last CPP Visit:  ?Educated on Daily salt intake goal < 2300 mg; ?Exercise goal of 150 minutes per week; ?-Counseled to monitor BP at home at least three times per week, document, and provide  log at future appointments ?-Recommended to continue current medication ? ?Any recent hospitalizations or ED visits since last visit with CPP? No ? ?What diet changes have been made to improve Blood Pressure Control?  ?Patient stated she doesn't use salt while cooking, eats fruits/veggies and drinks plenty of water. ? ?What exercise is being done to improve your Blood Pressure Control?  ?Patient stated she walks outside, does arm exercises, stretches and runs errands. ? ?Adherence Review: ?Is the patient currently on ACE/ARB medication? No ?Does the patient have >5 day gap between last estimated fill dates? No ? ?Care Gaps: ?Yearly mammogram overdue ?2nd shingrix overdue ? ?Star Rating Drugs: ?Atorvastatin 10 mg- Last filled 10-12-2021 90 [facility] ?Ozempic 1 mg- Last filled 08-04-2021 84 DS [facility] ? ?Malecca Hicks CMA ?Clinical Pharmacist Assistant ?[phone]  ?

## 2021-11-01 ENCOUNTER — Ambulatory Visit (HOSPITAL_COMMUNITY)
Admission: RE | Admit: 2021-11-01 | Discharge: 2021-11-01 | Disposition: A | Discharge status: Home / Self Care | Payer: Medicare HMO | Source: Ambulatory Visit | Attending: Cardiology | Admitting: Cardiology

## 2021-11-01 ENCOUNTER — Encounter (HOSPITAL_COMMUNITY): Payer: Self-pay | Admitting: Cardiology

## 2021-11-01 VITALS — BP 130/62 | HR 66

## 2021-11-01 DIAGNOSIS — I272 Pulmonary hypertension, unspecified: Secondary | ICD-10-CM | POA: Diagnosis not present

## 2021-11-01 DIAGNOSIS — I110 Hypertensive heart disease with heart failure: Secondary | ICD-10-CM | POA: Insufficient documentation

## 2021-11-01 DIAGNOSIS — J8410 Pulmonary fibrosis, unspecified: Secondary | ICD-10-CM | POA: Insufficient documentation

## 2021-11-01 DIAGNOSIS — F17200 Nicotine dependence, unspecified, uncomplicated: Secondary | ICD-10-CM | POA: Diagnosis not present

## 2021-11-01 DIAGNOSIS — Z79899 Other long term (current) drug therapy: Secondary | ICD-10-CM | POA: Insufficient documentation

## 2021-11-01 DIAGNOSIS — E669 Obesity, unspecified: Secondary | ICD-10-CM | POA: Diagnosis not present

## 2021-11-01 DIAGNOSIS — G4733 Obstructive sleep apnea (adult) (pediatric): Secondary | ICD-10-CM | POA: Diagnosis not present

## 2021-11-01 DIAGNOSIS — I5032 Chronic diastolic (congestive) heart failure: Secondary | ICD-10-CM | POA: Diagnosis not present

## 2021-11-01 DIAGNOSIS — J439 Emphysema, unspecified: Secondary | ICD-10-CM | POA: Diagnosis not present

## 2021-11-01 LAB — BASIC METABOLIC PANEL
Anion gap: 7 (ref 5–15)
BUN: 13 mg/dL (ref 8–23)
CO2: 30 mmol/L (ref 22–32)
Calcium: 9.3 mg/dL (ref 8.9–10.3)
Chloride: 100 mmol/L (ref 98–111)
Creatinine, Ser: 0.9 mg/dL (ref 0.44–1.00)
GFR, Estimated: >60 mL/min (ref >60)
Glucose, Bld: 98 mg/dL (ref 70–99)
Potassium: 3.7 mmol/L (ref 3.5–5.1)
Sodium: 137 mmol/L (ref 135–145)

## 2021-11-01 LAB — BRAIN NATRIURETIC PEPTIDE: B Natriuretic Peptide: 56.1 pg/mL (ref 0.0–100.0)

## 2021-11-01 MED ORDER — FUROSEMIDE 80 MG PO TABS: ORAL_TABLET | ORAL | 2 refills | Status: DC

## 2021-11-01 NOTE — Patient Instructions (Signed)
EKG done today. ? ?Labs done today. We will contact you only if your labs are abnormal. ? ?INCREASE Lasix to '80mg'$  (1 tablet) by mouth every morning and '40mg'$  (1/2 tablet) by mouth every evening Monday-Friday, take '40mg'$  (1/2 tablet) by mouth 2 times daily on Saturdays and sundays.  ? ?No other medication changes were made. Please continue all current medications as prescribed. ? ?Your physician recommends that you schedule a follow-up appointment in: 10 days for a lab only appointment and in 6 weeks with our NP/PA Clinic  ? ?If you have any questions or concerns before your next appointment please send [place] a message through [place] or call our office at [phone].   ? ?TO LEAVE A MESSAGE FOR THE NURSE SELECT OPTION 2, PLEASE LEAVE A MESSAGE INCLUDING: ?YOUR NAME ?DATE OF BIRTH ?CALL BACK NUMBER ?REASON FOR CALL**this is important as we prioritize the call backs ? ?YOU WILL RECEIVE A CALL BACK THE SAME DAY AS LONG AS YOU CALL BEFORE 4:00 PM ? ? ?Do the following things EVERYDAY: ?Weigh yourself in the morning before breakfast. Write it down and keep it in a log. ?Take your medicines as prescribed ?Eat low salt foods--Limit salt (sodium) to 2000 mg per day.  ?Stay as active as you can everyday ?Limit all fluids for the day to less than 2 liters ? ? ?At the [facility], you and your health needs are our priority. As part of our continuing mission to provide you with exceptional heart care, we have created designated Provider Care Teams. These Care Teams include your primary Cardiologist (physician) and Advanced Practice Providers (APPs- Physician Assistants and Nurse Practitioners) who all work together to provide you with the care you need, when you need it.  ? ?You may see any of the following providers on your designated Care Team at your next follow up: ?Dr Glori Bickers ?Dr Loralie Champagne ?Darrick Grinder, NP ?Lyda Jester, PA ?Audry Riles, PharmD ? ? ?Please be sure to bring in all your  medications bottles to every appointment.  ? ?

## 2021-11-02 ENCOUNTER — Other Ambulatory Visit: Payer: Self-pay

## 2021-11-02 MED ORDER — OZEMPIC (1 MG/DOSE) 4 MG/3ML ~~LOC~~ SOPN: PEN_INJECTOR | SUBCUTANEOUS | 3 refills | Status: DC

## 2021-11-02 NOTE — Progress Notes (Signed)
PCP: [name], MD ?Cardiology: Dr. Ellyn Hack ?HF Cardiology: Dr. [name] ? ?76 y.o. with history of OHS/OSA and RV dysfunction was referred by Dr. Ellyn Hack for evaluation of CHF.  She has a history of OSA/OHS.  She uses CPAP at night but not currently using oxygen during the day.  Remote smoker.  She had an echo in 9/20 showing EF 50-55% but moderate-severe RV dysfunction with mild-moderate RV dilation.  RHC/LHC in 10/20 showed normal coronaries, pulmonary venous hypertension.  V/Q scan in 2/21 did not show evidence for acute on chronic PE.   ? ?Cardiomems was placed in 5/21.  RHC at that time showed normal filling pressures and moderate pulmonary hypertension but suspect due to high output.  No evidence for shunt lesion.  Echo showed EF 60-65%, enlarged RV with normal systolic function.  ? ?She was admitted in 5/21 with syncope, thought to be due to a seizure.   ? ?She had COVID-19 infection in 8/21. 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  ? ?Echo in 9/21 with EF 50-55%, severe RV enlargement and severely decreased RV systolic function, PASP 54 mmHg.   ? ?She was admitted in 5/22 with CHF, bronchitis.  She was diuresed and treated with antibiotics. Echo in 5/22 showed EF 60-65%, mild LVH, ?normal RV, severe LAE, ?normal PA pressure, IVC normal.  V/Q scan negative.  ? ?In 10/22, she was admitted with GI bleeding, thought to be diverticular.  BP meds were decreased and Lasix was cut back.  With increased weight, she increased her Lasix back to 80 mg daily.  ? ?She returns for followup of CHF.  Weight is down 6 lbs. She is on semaglutide.  BP is controlled.  She uses a cane for balance. She walks in her driveway for exercise, no dyspnea walking short distances.  She is short of breath walking through [employer]. She has chronic orthopnea and coughs if she tries to lie down. She is using CPAP.  Rare atypical chest  pain.   ? ?ECG (personally reviewed): NSR, iRBBB ? ?Cardiomems PADP 21 mmHg (goal 23).  ? ?Labs (10/20): K 4.1, creatinine 1.15, hgb 11.4 ?Labs (6/21): K 3.5, creatinine 1.04, BNP 81 ?Labs (12/21): K 3.7, creatinine 0.79 ?Labs (11/22): LDL 63, BNP 33 ?Labs (2/23): K 3.8, creatinine 0.97 ? ?PMH: ?1. OHS/OSA: She uses CPAP.  ?2. H/o CVA ?3. Seizure disorder ?4. Type 2 DM ?5. Depression ?6. HTN ?7. Hyperlipidemia ?8. Chronic diastolic CHF: Echo (7/00) with EF 50-55%, moderate to severe RV dilation with mild-moderately decreased RV systolic function.  ?- RHC/LHC (10/20): normal coronaries; mean RA 10, PA 56/14 mean 31, mean PCWP 13, LVEDP 18, CO/CI 7.35/3.73, PVR 2.44.  ?- RHC/Cardiomems placement (5/21): mean RA 7, PA 58/21 mean 34, PCWP mean 12, CI 8.3 F/3.95 T, PVR 2.86 WU. No evidence for shunt lesion.  ?- Echo (5/21): EF 60-65%, RV enlarged with normal systolic function.  ?- Echo (9/21): EF 50-55%, severe RV enlargement and severely decreased RV systolic function, PASP 54 mmHg.  ?- Echo (5/22): EF 60-65%, mild LVH, ?normal RV, severe LAE, ?normal PA pressure, IVC normal. ?9. Primarily pulmonary venous hypertension.  ?- V/Q scan (2/21, 5/22): No evidence for acute or chronic PE.  ?- PFTs normal in 5/21.  ?10. COVID-19 PNA 8/21.  ?- CT chest in 10/21 with emphysema and mild fibrosis concerning for post-infectious inflammatory fibrosis from COVID.  ?11. GI bleeding: 10/22, diverticular bleed.  ? ?  Social History  ? ?Socioeconomic History  ? Marital status: Divorced  ?  Spouse name: Not on file  ? Number of children: Not on file  ? Years of education: Not on file  ? Highest education level: Not on file  ?Occupational History  ? 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 Smoking status: Former  ?  Packs/day: 0.50  ?  Years: 25.00  ?  Pack years: 12.50  ?  Types: Cigarettes  ? Smokeless tobacco: Never  ? Tobacco comments:  ?  quit smoking 20+ytrs ago  ?Vaping Use  ? Vaping Use: Never used  ?Substance and  Sexual Activity  ? Alcohol use: No  ? Drug use: No  ? Sexual activity: Not Currently  ?  Birth control/protection: Surgical  ?Other Topics Concern  ? Not on file  ?Social History Narrative  ? She lives in [place]. She has lots of family in the area and is accompanied by her sister.  ? 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  ? She has a distant history of smoking, quit 20 years ago.  ? Right handed   ? ?Social Determinants of Health  ? ?Financial Resource Strain: Low Risk   ? Difficulty of Paying Living Expenses: Not hard at all  ?Food Insecurity: No Food Insecurity  ? Worried About Charity fundraiser in the Last Year: Never true  ? Ran Out of Food in the Last Year: Never true  ?Transportation Needs: No Transportation Needs  ? Lack of Transportation (Medical): No  ? Lack of Transportation (Non-Medical): No  ?Physical Activity: Inactive  ? Days of Exercise per Week: 0 days  ? Minutes of Exercise per Session: 0 min  ?Stress: No Stress Concern Present  ? Feeling of Stress : Only a little  ?Social Connections: Not on file  ?Intimate Partner Violence: Not on file  ? ?Family History  ?Problem Relation Age of Onset  ? Allergies Father   ? Heart disease Father   ?     before 14  ? Heart failure Father   ? Hypertension Father   ? Hyperlipidemia Father   ? Heart disease Mother   ? Diabetes Mother   ? Hypertension Mother   ? Hyperlipidemia Mother   ? Hypertension Sister   ? Heart disease Sister   ?     before 60  ? Hyperlipidemia Sister   ? Diabetes Brother   ? Hypertension Brother   ? Diabetes Sister   ? Hypertension Sister   ? Diabetes Son   ? Hypertension Son   ? Cancer Other   ? ?Current Outpatient Medications  ?Medication Sig Dispense Refill  ? acetaminophen (TYLENOL) 500 MG tablet Take 500-1,000 mg by mouth every 4 (four) hours as needed for moderate pain.     ? albuterol (VENTOLIN HFA) 108 (90 Base) MCG/ACT inhaler Inhale 1-2 puffs into the lungs every 6 (six) hours as needed for  wheezing or shortness of breath. 18 g 5  ? Alcohol Swabs (DROPSAFE ALCOHOL PREP) 70 % PADS USE TWO TIMES DAILY AS DIRECTED 200 each 6  ? Ascorbic Acid (VITAMIN C) 1000 MG tablet Take 1,000 mg by mouth daily.    ? atorvastatin (LIPITOR) 10 MG tablet TAKE 1 TABLET EVERY EVENING. 90 tablet 2  ? B-D ULTRAFINE III SHORT PEN 31G X 8 MM MISC USE DAILY WITH VICTOZA AND/OR NOVOLOG. 400 each 2  ? benzonatate (TESSALON PERLES) 100 MG capsule  Take 1 capsule (100 mg total) by mouth 3 (three) times daily as needed for cough. 30 capsule 1  ? Blood Glucose Calibration (TRUE METRIX LEVEL 1) Low SOLN Use as directed  Dx:e11.22 1 each 2  ? Blood Glucose Monitoring Suppl (TRUE METRIX METER) w/Device KIT USE AS DIRECTED 1 kit 1  ? Budeson-Glycopyrrol-Formoterol (BREZTRI AEROSPHERE) 160-9-4.8 MCG/ACT AERO Inhale 2 puffs into the lungs in the morning and at bedtime. 10.7 g 2  ? carvedilol (COREG) 12.5 MG tablet Take 1 tablet (12.5 mg total) by mouth 2 (two) times daily. 180 tablet 3  ? dexlansoprazole (DEXILANT) 60 MG capsule Take 1 capsule (60 mg total) by mouth daily. 90 capsule 2  ? diclofenac sodium (VOLTAREN) 1 % GEL Apply 2 g topically 4 (four) times daily as needed (pain).    ? ferrous sulfate 324 MG TBEC Patient takes 1 tablet by mouth every other day.    ? Glucagon (GVOKE HYPOPEN 1-PACK) 0.5 MG/0.1ML SOAJ Inject 0.5 mg into the skin daily as needed. 0.1 mL 11  ? glucose blood (TRUE METRIX BLOOD GLUCOSE TEST) test strip USE AS DIRECTED TO CHECK BLOOD SUGARS 2 TIMES PER DAY 200 strip 2  ? guaiFENesin-dextromethorphan (ROBITUSSIN DM) 100-10 MG/5ML syrup Take 10 mLs by mouth every 6 (six) hours as needed for cough. 118 mL 0  ? hydrALAZINE (APRESOLINE) 100 MG tablet Take 1 tablet (100 mg total) by mouth 3 (three) times daily. 270 tablet 3  ? hydrocortisone cream 1 % Apply 1 application topically daily as needed for itching.     ? insulin glargine, 1 Unit Dial, (TOUJEO SOLOSTAR) 300 UNIT/ML Solostar Pen Inject 25 Units into the skin  daily. 4.5 mL 1  ? insulin lispro (HUMALOG KWIKPEN) 100 UNIT/ML KwikPen Inject 0.02-0.04 mLs (2-4 Units total) into the skin 3 (three) times daily as needed (high blood sugar over 150). 15 mL 2  ? Isopropyl Alc

## 2021-11-03 ENCOUNTER — Other Ambulatory Visit: Payer: Self-pay | Admitting: Neurology

## 2021-11-03 ENCOUNTER — Telehealth: Payer: Self-pay

## 2021-11-03 NOTE — Telephone Encounter (Signed)
Called patient and she stated that she does not need refrills and they have sent it to her today. She stated she takes 2 tablets at night.  ?

## 2021-11-03 NOTE — Telephone Encounter (Signed)
Can you pls confirm with patient how she is taking it? Ok to send refills for how she is taking it, thanks ?

## 2021-11-03 NOTE — Telephone Encounter (Signed)
[facility] sent in a refill request for patient for a Refill on patients Levetiracetam ER 500 mg take 3 tablets every night.  ? ?Per last note with Dr. Delice Lesch Continue Levetiracetam '500mg'$ : take 2 tablets every night.  ?

## 2021-11-11 ENCOUNTER — Other Ambulatory Visit: Payer: Self-pay | Admitting: Internal Medicine

## 2021-11-13 ENCOUNTER — Other Ambulatory Visit (HOSPITAL_COMMUNITY): Payer: Medicare HMO

## 2021-11-14 ENCOUNTER — Other Ambulatory Visit (HOSPITAL_COMMUNITY): Payer: Medicare HMO

## 2021-11-16 ENCOUNTER — Other Ambulatory Visit: Payer: Self-pay

## 2021-11-16 ENCOUNTER — Ambulatory Visit: Payer: Medicare HMO | Admitting: Internal Medicine

## 2021-11-22 ENCOUNTER — Other Ambulatory Visit: Payer: Self-pay

## 2021-11-22 MED ORDER — ACCU-CHEK SOFTCLIX LANCETS MISC: 2 refills | Status: DC

## 2021-12-05 DIAGNOSIS — H524 Presbyopia: Secondary | ICD-10-CM | POA: Diagnosis not present

## 2021-12-12 ENCOUNTER — Encounter: Payer: Self-pay | Admitting: Internal Medicine

## 2021-12-12 ENCOUNTER — Ambulatory Visit (INDEPENDENT_AMBULATORY_CARE_PROVIDER_SITE_OTHER): Payer: Medicare HMO | Admitting: Internal Medicine

## 2021-12-12 VITALS — BP 130/80 | HR 68 | Temp 98.2°F | Ht 61.0 in | Wt 188.6 lb

## 2021-12-12 DIAGNOSIS — F339 Major depressive disorder, recurrent, unspecified: Secondary | ICD-10-CM | POA: Diagnosis not present

## 2021-12-12 DIAGNOSIS — Z23 Encounter for immunization: Secondary | ICD-10-CM

## 2021-12-12 DIAGNOSIS — N182 Chronic kidney disease, stage 2 (mild): Secondary | ICD-10-CM | POA: Diagnosis not present

## 2021-12-12 DIAGNOSIS — I5032 Chronic diastolic (congestive) heart failure: Secondary | ICD-10-CM | POA: Diagnosis not present

## 2021-12-12 DIAGNOSIS — M069 Rheumatoid arthritis, unspecified: Secondary | ICD-10-CM

## 2021-12-12 DIAGNOSIS — G2111 Neuroleptic induced parkinsonism: Secondary | ICD-10-CM

## 2021-12-12 DIAGNOSIS — Z6835 Body mass index (BMI) 35.0-35.9, adult: Secondary | ICD-10-CM | POA: Diagnosis not present

## 2021-12-12 DIAGNOSIS — I25118 Atherosclerotic heart disease of native coronary artery with other forms of angina pectoris: Secondary | ICD-10-CM

## 2021-12-12 DIAGNOSIS — J410 Simple chronic bronchitis: Secondary | ICD-10-CM

## 2021-12-12 DIAGNOSIS — E1122 Type 2 diabetes mellitus with diabetic chronic kidney disease: Secondary | ICD-10-CM

## 2021-12-12 DIAGNOSIS — I110 Hypertensive heart disease with heart failure: Secondary | ICD-10-CM

## 2021-12-12 DIAGNOSIS — Z794 Long term (current) use of insulin: Secondary | ICD-10-CM | POA: Diagnosis not present

## 2021-12-12 LAB — HEMOGLOBIN A1C
Est. average glucose Bld gHb Est-mCnc: 163 mg/dL
Hgb A1c MFr Bld: 7.3 % — ABNORMAL HIGH (ref 4.8–5.6)

## 2021-12-12 MED ORDER — ACCU-CHEK SOFTCLIX LANCETS MISC
12 refills | Status: DC
Start: 2021-12-12 — End: 2022-03-15

## 2021-12-12 NOTE — Patient Instructions (Signed)

## 2021-12-12 NOTE — Progress Notes (Signed)
PCP: [name], MD Cardiology: Dr. Ellyn Hack HF Cardiology: Dr. [name]  76 y.o. with history of OHS/OSA and RV dysfunction was referred by Dr. Ellyn Hack for evaluation of CHF.  She has a history of OSA/OHS.  She uses CPAP at night but not currently using oxygen during the day.  Remote smoker.  She had an echo in 9/20 showing EF 50-55% but moderate-severe RV dysfunction with mild-moderate RV dilation.  RHC/LHC in 10/20 showed normal coronaries, pulmonary venous hypertension.  V/Q scan in 2/21 did not show evidence for acute on chronic PE.    Cardiomems was placed in 5/21.  RHC at that time showed normal filling pressures and moderate pulmonary hypertension but suspect due to high output.  No evidence for shunt lesion.  Echo showed EF 60-65%, enlarged RV with normal systolic function.   She was admitted in 5/21 with syncope, thought to be due to a seizure.    She had COVID-19 infection in 8/21. 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   Echo in 9/21 with EF 50-55%, severe RV enlargement and severely decreased RV systolic function, PASP 54 mmHg.    She was admitted in 5/22 with CHF, bronchitis.  She was diuresed and treated with antibiotics. Echo in 5/22 showed EF 60-65%, mild LVH, ?normal RV, severe LAE, ?normal PA pressure, IVC normal.  V/Q scan negative.   In 10/22, she was admitted with GI bleeding, thought to be diverticular.  BP meds were decreased and Lasix was cut back.  With increased weight, she increased her Lasix back to 80 mg daily.   She returns for followup of CHF.  Weight is down 6 lbs. She is on semaglutide.  BP is controlled.  She uses a cane for balance. She walks in her driveway for exercise, no dyspnea walking short distances.  She is short of breath walking through [employer]. She has chronic orthopnea and coughs if she tries to lie down. She is using CPAP.  Rare atypical chest  pain.    ECG (personally reviewed): NSR, iRBBB  Cardiomems PADP 21 mmHg (goal 23).   Labs (10/20): K 4.1, creatinine 1.15, hgb 11.4 Labs (6/21): K 3.5, creatinine 1.04, BNP 81 Labs (12/21): K 3.7, creatinine 0.79 Labs (11/22): LDL 63, BNP 33 Labs (2/23): K 3.8, creatinine 0.97  PMH: 1. OHS/OSA: She uses CPAP.  2. H/o CVA 3. Seizure disorder 4. Type 2 DM 5. Depression 6. HTN 7. Hyperlipidemia 8. Chronic diastolic CHF: Echo (5/36) with EF 50-55%, moderate to severe RV dilation with mild-moderately decreased RV systolic function.  - RHC/LHC (10/20): normal coronaries; mean RA 10, PA 56/14 mean 31, mean PCWP 13, LVEDP 18, CO/CI 7.35/3.73, PVR 2.44.  - RHC/Cardiomems placement (5/21): mean RA 7, PA 58/21 mean 34, PCWP mean 12, CI 8.3 F/3.95 T, PVR 2.86 WU. No evidence for shunt lesion.  - Echo (5/21): EF 60-65%, RV enlarged with normal systolic function.  - Echo (9/21): EF 50-55%, severe RV enlargement and severely decreased RV systolic function, PASP 54 mmHg.  - Echo (5/22): EF 60-65%, mild LVH, ?normal RV, severe LAE, ?normal PA pressure, IVC normal. 9. Primarily pulmonary venous hypertension.  - V/Q scan (2/21, 5/22): No evidence for acute or chronic PE.  - PFTs normal in 5/21.  10. COVID-19 PNA 8/21.  - CT chest in 10/21 with emphysema and mild fibrosis concerning for post-infectious inflammatory fibrosis from COVID.  11. GI bleeding: 10/22, diverticular bleed.  Social History   Socioeconomic History   Marital status: Divorced    Spouse name: Not on file   Number of children: Not on file   Years of education: Not on file   Highest education level: Not on file  Occupational History   Occupation: Disabled    Comment: CNA  Tobacco Use   Smoking status: Former    Packs/day: 0.50    Years: 25.00    Total pack years: 12.50    Types: Cigarettes   Smokeless tobacco: Never   Tobacco comments:    quit smoking 20+ytrs ago  Vaping Use   Vaping Use: Never used  Substance and  Sexual Activity   Alcohol use: No   Drug use: No   Sexual activity: Not Currently    Birth control/protection: Surgical  Other Topics Concern   Not on file  Social History Narrative   She lives in [place]. She has lots of family in the area and is accompanied by her sister.   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   She has a distant history of smoking, quit 20 years ago.   Right handed    Social Determinants of Health   Financial Resource Strain: Low Risk  (07/06/2021)   Overall Financial Resource Strain (CARDIA)    Difficulty of Paying Living Expenses: Not hard at all  Food Insecurity: No Food Insecurity (07/06/2021)   Hunger Vital Sign    Worried About Running Out of Food in the Last Year: Never true    Ran Out of Food in the Last Year: Never true  Transportation Needs: No Transportation Needs (07/06/2021)   PRAPARE - Hydrologist (Medical): No    Lack of Transportation (Non-Medical): No  Recent Concern: Transportation Needs - Unmet Transportation Needs (04/26/2021)   PRAPARE - Transportation    Lack of Transportation (Medical): Yes    Lack of Transportation (Non-Medical): Yes  Physical Activity: Inactive (07/06/2021)   Exercise Vital Sign    Days of Exercise per Week: 0 days    Minutes of Exercise per Session: 0 min  Stress: No Stress Concern Present (07/06/2021)   [place]    Feeling of Stress : Only a little  Social Connections: Not on file  Intimate Partner Violence: Not At Risk (10/22/2018)   Humiliation, Afraid, Rape, and Kick questionnaire    Fear of Current or Ex-Partner: No    Emotionally Abused: No    Physically Abused: No    Sexually Abused: No   Family History  Problem Relation Age of Onset   Allergies Father    Heart disease Father        before 61   Heart failure Father    Hypertension Father    Hyperlipidemia Father     Heart disease Mother    Diabetes Mother    Hypertension Mother    Hyperlipidemia Mother    Hypertension Sister    Heart disease Sister        before 5   Hyperlipidemia Sister    Diabetes Brother    Hypertension Brother    Diabetes Sister    Hypertension Sister    Diabetes Son    Hypertension Son    Cancer Other    Current Outpatient Medications  Medication Sig Dispense Refill   Accu-Chek Softclix Lancets lancets Use as instructed to check blood sugars twice daily e11.69 100 each 2  acetaminophen (TYLENOL) 500 MG tablet Take 500-1,000 mg by mouth every 4 (four) hours as needed for moderate pain.      albuterol (VENTOLIN HFA) 108 (90 Base) MCG/ACT inhaler Inhale 1-2 puffs into the lungs every 6 (six) hours as needed for wheezing or shortness of breath. 18 g 5   Alcohol Swabs (DROPSAFE ALCOHOL PREP) 70 % PADS USE TWO TIMES DAILY AS DIRECTED 200 each 6   Ascorbic Acid (VITAMIN C) 1000 MG tablet Take 1,000 mg by mouth daily.     atorvastatin (LIPITOR) 10 MG tablet TAKE 1 TABLET EVERY EVENING. 90 tablet 2   B-D ULTRAFINE III SHORT PEN 31G X 8 MM MISC USE DAILY WITH VICTOZA AND/OR NOVOLOG. 400 each 2   Blood Glucose Calibration (TRUE METRIX LEVEL 1) Low SOLN Use as directed  Dx:e11.22 1 each 2   Blood Glucose Monitoring Suppl (TRUE METRIX METER) w/Device KIT USE AS DIRECTED 1 kit 1   Budeson-Glycopyrrol-Formoterol (BREZTRI AEROSPHERE) 160-9-4.8 MCG/ACT AERO Inhale 2 puffs into the lungs in the morning and at bedtime. 10.7 g 2   carvedilol (COREG) 12.5 MG tablet Take 1 tablet (12.5 mg total) by mouth 2 (two) times daily. 180 tablet 3   dexlansoprazole (DEXILANT) 60 MG capsule TAKE 1 CAPSULE EVERY DAY 90 capsule 2   diclofenac sodium (VOLTAREN) 1 % GEL Apply 2 g topically 4 (four) times daily as needed (pain).     ferrous sulfate 324 MG TBEC Patient takes 1 tablet by mouth every other day.     furosemide (LASIX) 80 MG tablet Take 1 tablet by mouth every morning and 1/2 tablet by mouth every  evening m-f then take 1/2 tablet by mouth 2 times daily on sat & sun 120 tablet 2   Glucagon (GVOKE HYPOPEN 1-PACK) 0.5 MG/0.1ML SOAJ Inject 0.5 mg into the skin daily as needed. 0.1 mL 11   glucose blood (TRUE METRIX BLOOD GLUCOSE TEST) test strip USE AS DIRECTED TO CHECK BLOOD SUGARS 2 TIMES PER DAY 200 strip 2   guaiFENesin-dextromethorphan (ROBITUSSIN DM) 100-10 MG/5ML syrup Take 10 mLs by mouth every 6 (six) hours as needed for cough. (Patient not taking: Reported on 12/12/2021) 118 mL 0   hydrALAZINE (APRESOLINE) 100 MG tablet Take 1 tablet (100 mg total) by mouth 3 (three) times daily. 270 tablet 3   hydrocortisone cream 1 % Apply 1 application topically daily as needed for itching.      insulin glargine, 1 Unit Dial, (TOUJEO SOLOSTAR) 300 UNIT/ML Solostar Pen Inject 25 Units into the skin daily. 4.5 mL 1   insulin lispro (HUMALOG KWIKPEN) 100 UNIT/ML KwikPen Inject 0.02-0.04 mLs (2-4 Units total) into the skin 3 (three) times daily as needed (high blood sugar over 150). 15 mL 2   Isopropyl Alcohol (ALCOHOL WIPES) 70 % MISC Apply 1 each topically 2 (two) times daily. 300 each 3   isosorbide mononitrate (IMDUR) 30 MG 24 hr tablet TAKE 1 TABLET EVERY DAY (APPOINTMENT IS NEEDED FOR REFILLS) 90 tablet 2   lamoTRIgine (LAMICTAL) 150 MG tablet Take 1 tablet (150 mg total) by mouth 2 (two) times daily. 180 tablet 3   levETIRAcetam (KEPPRA) 500 MG tablet Take 1,000 mg by mouth at bedtime.     ondansetron (ZOFRAN) 4 MG tablet Take 4 mg by mouth every 8 (eight) hours as needed for nausea or vomiting.     OZEMPIC, 1 MG/DOSE, 4 MG/3ML SOPN INJECT 1 MG INTO THE SKIN ONCE A WEEK. 9 mL 3   Pancrelipase, Lip-Prot-Amyl, 56433-29518  units CPEP Take 1-2 capsules by mouth See admin instructions. Take 1 capsules in the morning with breakfast, 1 capsule with lunch, and 2 capsules with supper     potassium chloride SA (KLOR-CON M) 20 MEQ tablet Take 2 tablets daily on Days taking Metolazone take 3 tablets 204 tablet 2    sennosides-docusate sodium (SENOKOT-S) 8.6-50 MG tablet Take 1-2 tablets by mouth at bedtime as needed for constipation.     sertraline (ZOLOFT) 50 MG tablet TAKE 1 TABLET EVERY DAY 90 tablet 2   tolnaftate (TINACTIN) 1 % cream Apply 1 application topically daily as needed (foot fungus).      TRUEplus Lancets 33G MISC TEST BLOOD SUGAR TWICE DAILY AS DIRECTED 200 each 3   No current facility-administered medications for this visit.   There were no vitals taken for this visit. General: NAD Neck: JVP 8 cm with HJR, no thyromegaly or thyroid nodule.  Lungs: Clear to auscultation bilaterally with normal respiratory effort. CV: Nondisplaced PMI.  Heart regular S1/S2, no S3/S4, 1/6 SEM RUSB.  Trace ankle edema.  No carotid bruit.  Normal pedal pulses.  Abdomen: Soft, nontender, no hepatosplenomegaly, no distention.  Skin: Intact without lesions or rashes.  Neurologic: Alert and oriented x 3.  Psych: Normal affect. Extremities: No clubbing or cyanosis.  HEENT: Normal.   Assessment/Plan: 1. Chronic diastolic CHF/RV failure: Echo in 9/20 showed EF 50-55% with moderate-severe RV dilation and mild-moderately decreased systolic function. RHC/LHC showed mild-moderate pulmonary venous hypertension and no significant coronary disease.  Cause of RV dysfunction is not clear, possibly due to diastolic CHF + OHS/OSA.  V/Q scan in 2/21 did not show acute or chronic PE.  Cardiomems was placed in 5/21, normal filling pressures with moderate pulmonary hypertension likely due to high output (no evidence for shunt lesion on RHC).  Echo in 9/21 with EF 50-55%, severe RVE with severe RV dysfunction.  Echo in 5/22 with EF 60-65%, RV function reportedly normal with normal PA pressure.  Her dyspnea is partially related to lung disease; based on last CT, she has developed mild pulmonary fibrosis since COVID, possibly post-inflammatory.  However, she does look mildly volume overloaded on exam and has NYHA class III symptoms.   Cardiomems 21 mmHg is actually below her current goal (23 mmHg).  - Increase Lasix to 80 qam/40 qpm and 40 mg bid on Saturday/Sunday. BMET/BNP today, BMET 10 days.  - She did not tolerate SGLT2-inhibitor due to recurrent yeast infections.    - I would like to lower her Cardiomems PADP goal to 20 mmHg.  2. OHS/OSA: She is using CPAP but not oxygen during the day (no longer appears to need). CT chest in 10/21 with emphysema and mild fibrosis, possibly post-inflammatory after COVID-19 infection.   - Followup with pulmonary (Dr. Vaughan Browner).  3. Pulmonary hypertension: RHC in 10/20 showed mild-moderate pulmonary venous hypertension.  RHC in 5/21 showed moderate pulmonary hypertension with low PVR and high CO, likely PH due to high output, no evidence for shunt lesion. No role for selective pulmonary vasodilators.  4. HTN: BP controlled on hydralazine.  5. Obesity: She is on semaglutide, weight trending down.  6. GI bleeding: No overt bleeding now.    Followup in 6 wks with APP.   [place] 12/12/2021

## 2021-12-12 NOTE — Progress Notes (Signed)
I,Victoria T Hamilton,acting as a scribe for [name], MD.,have documented all relevant documentation on the behalf of [name], MD,as directed by  [name], MD while in the presence of [name], MD.  This visit occurred during the SARS-CoV-2 public health emergency.  Safety protocols were in place, including screening questions prior to the visit, additional usage of staff PPE, and extensive cleaning of exam room while observing appropriate contact time as indicated for disinfecting solutions.  Subjective:     Patient ID: [name] , female    DOB: Jan 24, 1946 , 76 y.o.   MRN: [id number]   Chief Complaint  Patient presents with   Diabetes   Hypertension    HPI  She is here today for a diabetes and BP check. She reports compliance with meds. She denies having any headaches, chest pain and shortness of breath. Patient would like to discuss use of GVOKE pen, she would like refresher on how to self administer. She does not know where to stop the dial.   Diabetes She presents for her follow-up diabetic visit. She has type 2 diabetes mellitus. Her disease course has been improving. There are no hypoglycemic associated symptoms. Pertinent negatives for diabetes include no blurred vision and no chest pain. There are no hypoglycemic complications. Diabetic complications include nephropathy. Risk factors for coronary artery disease include diabetes mellitus, dyslipidemia, hypertension, obesity, post-menopausal and sedentary lifestyle. She is compliant with treatment some of the time. Her weight is fluctuating minimally. She is following a generally healthy diet. She never participates in exercise. Her home blood glucose trend is decreasing steadily. Her breakfast blood glucose is taken between 8-9 am. Her breakfast blood glucose range is generally 130-140 mg/dl. An ACE inhibitor/angiotensin II receptor blocker is being taken. Eye exam is not current.  Hypertension This  is a chronic problem. The current episode started more than 1 year ago. The problem has been gradually improving since onset. The problem is controlled. Pertinent negatives include no blurred vision, chest pain, palpitations or shortness of breath. The current treatment provides moderate improvement. Hypertensive end-organ damage includes kidney disease. Identifiable causes of hypertension include sleep apnea.     Past Medical History:  Diagnosis Date   Abnormal liver function     in the past.   Anemia    Arthritis    all over   Bruises easily    Cataract    right eye;immature   CHF (congestive heart failure) (HCC)    Chronic back pain    stenosis   Chronic cough    Chronic kidney disease    COPD (chronic obstructive pulmonary disease) ([place])    COVID    aug/sept 2021   Demand myocardial infarction [facility]) 2012   Demand Infarction in setting of Pancreatitis --> mild Troponin elevation, NON-OBSTRUCTIVE CAD   Depression    takes Abilify daily as well as Zoloft   Diastolic heart failure 6761   Grade 1 diastolic Dysfunction by Echo    Diverticulosis    DM (diabetes mellitus) ([place])    takes Victoza daily as well as Lantus and Humalog   Empty sella ([place])    on MRI in 2009.   Glaucoma    Headache(784.0)    last migraine-4-30yr ago   History of blood transfusion    no abnormal reaction noted   History of colon polyps    benign   HTN (hypertension)    takes Benicar,Imdur,and Bystolic daily   Hyperlipidemia  takes Lipitor daily   Joint swelling    Nocturia    Obesity hypoventilation syndrome ([place])    Obstructive sleep apnea 02/2018   Notably improved split-night study with weight loss from 270 (2017) down to 250 pounds (2019).   Pancreatitis    takes Pancrelipase daily   Parkinson's disease ([place])    takes Sinemet daily   Peripheral neuropathy    Pneumonia 2012   Seizures ([place])    takes Lamictal daily and Primidone nightly;last seizure 2wks ago   Urinary urgency    With  increased frequency   Varicose veins of both lower extremities with pain    With edema.  Takes daily Lasix     Family History  Problem Relation Age of Onset   Allergies Father    Heart disease Father        before 61   Heart failure Father    Hypertension Father    Hyperlipidemia Father    Heart disease Mother    Diabetes Mother    Hypertension Mother    Hyperlipidemia Mother    Hypertension Sister    Heart disease Sister        before 53   Hyperlipidemia Sister    Diabetes Brother    Hypertension Brother    Diabetes Sister    Hypertension Sister    Diabetes Son    Hypertension Son    Cancer Other      Current Outpatient Medications:    Accu-Chek Softclix Lancets lancets, Use as instructed to check blood sugars twice daily e11.69, Disp: 100 each, Rfl: 2   Accu-Chek Softclix Lancets lancets, Use as instructed to check blood sugars three times daily E11.69, Disp: 100 each, Rfl: 12   acetaminophen (TYLENOL) 500 MG tablet, Take 500-1,000 mg by mouth every 4 (four) hours as needed for moderate pain. , Disp: , Rfl:    albuterol (VENTOLIN HFA) 108 (90 Base) MCG/ACT inhaler, Inhale 1-2 puffs into the lungs every 6 (six) hours as needed for wheezing or shortness of breath., Disp: 18 g, Rfl: 5   Alcohol Swabs (DROPSAFE ALCOHOL PREP) 70 % PADS, USE TWO TIMES DAILY AS DIRECTED, Disp: 200 each, Rfl: 6   Ascorbic Acid (VITAMIN C) 1000 MG tablet, Take 1,000 mg by mouth daily., Disp: , Rfl:    atorvastatin (LIPITOR) 10 MG tablet, TAKE 1 TABLET EVERY EVENING., Disp: 90 tablet, Rfl: 2   B-D ULTRAFINE III SHORT PEN 31G X 8 MM MISC, USE DAILY WITH VICTOZA AND/OR NOVOLOG., Disp: 400 each, Rfl: 2   Blood Glucose Calibration (TRUE METRIX LEVEL 1) Low SOLN, Use as directed  Dx:e11.22, Disp: 1 each, Rfl: 2   Blood Glucose Monitoring Suppl (TRUE METRIX METER) w/Device KIT, USE AS DIRECTED, Disp: 1 kit, Rfl: 1   Budeson-Glycopyrrol-Formoterol (BREZTRI AEROSPHERE) 160-9-4.8 MCG/ACT AERO, Inhale 2 puffs  into the lungs in the morning and at bedtime., Disp: 10.7 g, Rfl: 2   carvedilol (COREG) 12.5 MG tablet, Take 1 tablet (12.5 mg total) by mouth 2 (two) times daily., Disp: 180 tablet, Rfl: 3   diclofenac sodium (VOLTAREN) 1 % GEL, Apply 2 g topically 4 (four) times daily as needed (pain)., Disp: , Rfl:    ferrous sulfate 324 MG TBEC, Patient takes 1 tablet by mouth every other day., Disp: , Rfl:    Glucagon (GVOKE HYPOPEN 1-PACK) 0.5 MG/0.1ML SOAJ, Inject 0.5 mg into the skin daily as needed., Disp: 0.1 mL, Rfl: 11   glucose blood (TRUE METRIX BLOOD GLUCOSE TEST) test  strip, USE AS DIRECTED TO CHECK BLOOD SUGARS 2 TIMES PER DAY, Disp: 200 strip, Rfl: 2   hydrALAZINE (APRESOLINE) 100 MG tablet, Take 1 tablet (100 mg total) by mouth 3 (three) times daily., Disp: 270 tablet, Rfl: 3   hydrocortisone cream 1 %, Apply 1 application topically daily as needed for itching. , Disp: , Rfl:    insulin glargine, 1 Unit Dial, (TOUJEO SOLOSTAR) 300 UNIT/ML Solostar Pen, Inject 25 Units into the skin daily., Disp: 4.5 mL, Rfl: 1   insulin lispro (HUMALOG KWIKPEN) 100 UNIT/ML KwikPen, Inject 0.02-0.04 mLs (2-4 Units total) into the skin 3 (three) times daily as needed (high blood sugar over 150)., Disp: 15 mL, Rfl: 2   Isopropyl Alcohol (ALCOHOL WIPES) 70 % MISC, Apply 1 each topically 2 (two) times daily., Disp: 300 each, Rfl: 3   isosorbide mononitrate (IMDUR) 30 MG 24 hr tablet, TAKE 1 TABLET EVERY DAY (APPOINTMENT IS NEEDED FOR REFILLS), Disp: 90 tablet, Rfl: 2   lamoTRIgine (LAMICTAL) 150 MG tablet, Take 1 tablet (150 mg total) by mouth 2 (two) times daily., Disp: 180 tablet, Rfl: 3   levETIRAcetam (KEPPRA) 500 MG tablet, Take 1,000 mg by mouth at bedtime., Disp: , Rfl:    OZEMPIC, 1 MG/DOSE, 4 MG/3ML SOPN, INJECT 1 MG INTO THE SKIN ONCE A WEEK., Disp: 9 mL, Rfl: 3   Pancrelipase, Lip-Prot-Amyl, 25000-79000 units CPEP, Take 1-2 capsules by mouth See admin instructions. Take 1 capsules in the morning with  breakfast, 1 capsule with lunch, and 2 capsules with supper, Disp: , Rfl:    sennosides-docusate sodium (SENOKOT-S) 8.6-50 MG tablet, Take 1-2 tablets by mouth at bedtime as needed for constipation., Disp: , Rfl:    sertraline (ZOLOFT) 50 MG tablet, TAKE 1 TABLET EVERY DAY, Disp: 90 tablet, Rfl: 2   tolnaftate (TINACTIN) 1 % cream, Apply 1 application topically daily as needed (foot fungus). , Disp: , Rfl:    TRUEplus Lancets 33G MISC, TEST BLOOD SUGAR TWICE DAILY AS DIRECTED, Disp: 200 each, Rfl: 3   dexlansoprazole (DEXILANT) 60 MG capsule, Take 1 capsule (60 mg total) by mouth daily., Disp: 90 capsule, Rfl: 2   furosemide (LASIX) 20 MG tablet, Take 4 tablets (80 mg total) by mouth in the morning AND 3 tablets (60 mg total) every evening., Disp: 180 tablet, Rfl: 3   guaiFENesin-dextromethorphan (ROBITUSSIN DM) 100-10 MG/5ML syrup, Take 10 mLs by mouth every 6 (six) hours as needed for cough., Disp: 118 mL, Rfl: 0   ondansetron (ZOFRAN) 4 MG tablet, Take 1 tablet (4 mg total) by mouth every 8 (eight) hours as needed for nausea or vomiting., Disp: 20 tablet, Rfl: 1   potassium chloride SA (KLOR-CON M) 20 MEQ tablet, Take 2 tablets (40 mEq total) by mouth in the morning AND 1 tablet (20 mEq total) every evening. With additional 60 on Metolazone days., Disp: 300 tablet, Rfl: 2   Allergies  Allergen Reactions   Other Anaphylaxis and Rash    Bleach   Penicillins Hives    Did it involve swelling of the face/tongue/throat, SOB, or low BP? No Did it involve sudden or severe rash/hives, skin peeling, or any reaction on the inside of your mouth or nose? Yes Did you need to seek medical attention at a hospital or doctor's office? Yes When did it last happen?      Childhood allergy  If all above answers are "NO", may proceed with cephalosporin use.     Sulfa Antibiotics Hives   Aspirin Other (  See Comments)     On aspirin 81 mg - Rectal bleeding in dec 2018   Codeine     Headache and makes the patient  feel "off"     Review of Systems  Constitutional: Negative.   Eyes:  Negative for blurred vision.  Respiratory: Negative.  Negative for shortness of breath.   Cardiovascular: Negative.  Negative for chest pain and palpitations.  Neurological: Negative.   Psychiatric/Behavioral: Negative.       Today's Vitals   12/12/21 1615  BP: 130/80  Pulse: 68  Temp: 98.2 F (36.8 C)  SpO2: 94%  Weight: 188 lb 9.6 oz (85.5 kg)  Height: _0  (1.549 m)  PainSc: 0-No pain   Body mass index is 35.64 kg/m.  Wt Readings from Last 3 Encounters:  12/28/21 189 lb 9.5 oz (86 kg)  12/18/21 190 lb 9.6 oz (86.5 kg)  12/13/21 188 lb (85.3 kg)    Objective:  Physical Exam Vitals and nursing note reviewed.  Constitutional:      Appearance: Normal appearance. She is obese.  HENT:     Head: Normocephalic and atraumatic.  Eyes:     Extraocular Movements: Extraocular movements intact.  Cardiovascular:     Rate and Rhythm: Normal rate and regular rhythm.     Heart sounds: Normal heart sounds.  Pulmonary:     Effort: Pulmonary effort is normal.     Breath sounds: Normal breath sounds.  Musculoskeletal:     Cervical back: Normal range of motion.  Skin:    General: Skin is warm.  Neurological:     General: No focal deficit present.     Mental Status: She is alert.  Psychiatric:        Mood and Affect: Mood normal.        Behavior: Behavior normal.        Assessment And Plan:     1. Type 2 diabetes mellitus with stage 2 chronic kidney disease, with long-term current use of insulin (HCC) Comments: Chronic.  She was instructed on how to properly use (and when to use) the Gvoke pen. I will check labs as below, f/u in 3 months . - Hemoglobin A1c  2. Hypertensive heart disease with chronic diastolic congestive heart failure (HCC) Comments: Chronic, controlled. She is reminded to follow low sodium diet.  3. Atherosclerotic heart disease of native coronary artery with other forms of angina  pectoris ([place]) Comments: Chronic, LDL goal <70. Importance of compliance w/ statin therapy and heart healthy lifestyle was stressed to the patient.   4. Recurrent depression ([place]) Comments: Chronic, sx are stable with current meds.   5. Rheumatoid arthritis involving both feet, unspecified whether rheumatoid factor present ([place]) Comments: Chronic, encouraged to follow anti-inflammatory diet. Diagnosed as per Podiatry. May benefit from ginger/turmeric tea.  6. Body mass index (BMI) of 35.0 to 35.9 Comments: She was congratulated on achieving this BMI. Goal BMI<30 to decrease cardiac risk.   7. Immunization due Comments: She was given second Shingrix today. This will be billed via [employer].  - Varicella-zoster vaccine IM (Shingrix)  Patient was given opportunity to ask questions. Patient verbalized understanding of the plan and was able to repeat key elements of the plan. All questions were answered to their satisfaction.   I, [name], MD, have reviewed all documentation for this visit. The documentation on 12/12/21 for the exam, diagnosis, procedures, and orders are all accurate and complete.   IF YOU HAVE BEEN REFERRED TO A SPECIALIST, IT  MAY TAKE 1-2 WEEKS TO SCHEDULE/PROCESS THE REFERRAL. IF YOU HAVE NOT HEARD FROM US/SPECIALIST IN TWO WEEKS, PLEASE GIVE US A CALL AT [phone] X [phone].   THE PATIENT IS ENCOURAGED TO PRACTICE SOCIAL DISTANCING DUE TO THE COVID-19 PANDEMIC.   

## 2021-12-13 ENCOUNTER — Telehealth: Payer: Self-pay

## 2021-12-13 ENCOUNTER — Telehealth: Payer: Medicare HMO

## 2021-12-13 ENCOUNTER — Encounter (HOSPITAL_COMMUNITY): Payer: Self-pay

## 2021-12-13 ENCOUNTER — Ambulatory Visit (HOSPITAL_COMMUNITY)
Admission: RE | Admit: 2021-12-13 | Discharge: 2021-12-13 | Disposition: A | Discharge status: Home / Self Care | Payer: Medicare HMO | Source: Ambulatory Visit | Attending: Family Medicine | Admitting: Family Medicine

## 2021-12-13 VITALS — BP 124/68 | HR 74 | Wt 188.0 lb

## 2021-12-13 DIAGNOSIS — K922 Gastrointestinal hemorrhage, unspecified: Secondary | ICD-10-CM | POA: Insufficient documentation

## 2021-12-13 DIAGNOSIS — Z6835 Body mass index (BMI) 35.0-35.9, adult: Secondary | ICD-10-CM | POA: Insufficient documentation

## 2021-12-13 DIAGNOSIS — J8410 Pulmonary fibrosis, unspecified: Secondary | ICD-10-CM | POA: Diagnosis not present

## 2021-12-13 DIAGNOSIS — G4733 Obstructive sleep apnea (adult) (pediatric): Secondary | ICD-10-CM | POA: Diagnosis not present

## 2021-12-13 DIAGNOSIS — J984 Other disorders of lung: Secondary | ICD-10-CM | POA: Diagnosis not present

## 2021-12-13 DIAGNOSIS — R2689 Other abnormalities of gait and mobility: Secondary | ICD-10-CM | POA: Diagnosis not present

## 2021-12-13 DIAGNOSIS — Z79899 Other long term (current) drug therapy: Secondary | ICD-10-CM | POA: Insufficient documentation

## 2021-12-13 DIAGNOSIS — I5032 Chronic diastolic (congestive) heart failure: Secondary | ICD-10-CM

## 2021-12-13 DIAGNOSIS — F17200 Nicotine dependence, unspecified, uncomplicated: Secondary | ICD-10-CM | POA: Insufficient documentation

## 2021-12-13 DIAGNOSIS — I272 Pulmonary hypertension, unspecified: Secondary | ICD-10-CM | POA: Diagnosis not present

## 2021-12-13 DIAGNOSIS — R0609 Other forms of dyspnea: Secondary | ICD-10-CM

## 2021-12-13 DIAGNOSIS — Z8719 Personal history of other diseases of the digestive system: Secondary | ICD-10-CM | POA: Diagnosis not present

## 2021-12-13 DIAGNOSIS — Z7902 Long term (current) use of antithrombotics/antiplatelets: Secondary | ICD-10-CM | POA: Diagnosis not present

## 2021-12-13 DIAGNOSIS — I50810 Right heart failure, unspecified: Secondary | ICD-10-CM | POA: Insufficient documentation

## 2021-12-13 DIAGNOSIS — I110 Hypertensive heart disease with heart failure: Secondary | ICD-10-CM | POA: Diagnosis not present

## 2021-12-13 DIAGNOSIS — E669 Obesity, unspecified: Secondary | ICD-10-CM | POA: Diagnosis not present

## 2021-12-13 DIAGNOSIS — E662 Morbid (severe) obesity with alveolar hypoventilation: Secondary | ICD-10-CM | POA: Diagnosis not present

## 2021-12-13 DIAGNOSIS — Z7409 Other reduced mobility: Secondary | ICD-10-CM | POA: Diagnosis not present

## 2021-12-13 LAB — BASIC METABOLIC PANEL
Anion gap: 10 (ref 5–15)
BUN: 12 mg/dL (ref 8–23)
CO2: 27 mmol/L (ref 22–32)
Calcium: 9 mg/dL (ref 8.9–10.3)
Chloride: 102 mmol/L (ref 98–111)
Creatinine, Ser: 1.01 mg/dL — ABNORMAL HIGH (ref 0.44–1.00)
GFR, Estimated: 59 mL/min — ABNORMAL LOW (ref >60)
Glucose, Bld: 161 mg/dL — ABNORMAL HIGH (ref 70–99)
Potassium: 3.2 mmol/L — ABNORMAL LOW (ref 3.5–5.1)
Sodium: 139 mmol/L (ref 135–145)

## 2021-12-13 MED ORDER — POTASSIUM CHLORIDE CRYS ER 20 MEQ PO TBCR: EXTENDED_RELEASE_TABLET | ORAL | 2 refills | Status: DC

## 2021-12-13 MED ORDER — FUROSEMIDE 20 MG PO TABS: ORAL_TABLET | ORAL | 3 refills | Status: DC

## 2021-12-13 NOTE — Telephone Encounter (Addendum)
  Care Management   Follow Up Note   12/13/2021 Name: [name] MRN: [id number] DOB: 1945/10/30   Referred by: [name], MD Reason for referral : Care Coordination  SW placed an unsuccessful outbound call to the patient at the request of the patients primary care provider. Dr. Baird Cancer requested SW educate the patient on her SCAT benefit due to the patient having difficulty securing a ride.  Follow Up Plan: A HIPPA compliant phone message was left for the patient providing contact information and requesting a return call.   Daneen Schick, BSW, CDP Social Worker, Certified Dementia Practitioner [facility] [phone]

## 2021-12-13 NOTE — Patient Instructions (Signed)
INCREASE Lasix to 80 mg in the AM and 60 mg in the PM INCREASE Potassium to 40 meq in the AM and 20 meq in the PM  Labs today We will only contact you if something comes back abnormal or we need to make some changes. Otherwise no news is good news!  Labs needed in 2 weeks  You have been referred to Home Health-PT They will be in contact with an appointment  Your physician recommends that you schedule a follow-up appointment in: 3 months with Dr [name]   Do the following things EVERYDAY: Weigh yourself in the morning before breakfast. Write it down and keep it in a log. Take your medicines as prescribed Eat low salt foods--Limit salt (sodium) to 2000 mg per day.  Stay as active as you can everyday Limit all fluids for the day to less than 2 liters  At the [facility], you and your health needs are our priority. As part of our continuing mission to provide you with exceptional heart care, we have created designated Provider Care Teams. These Care Teams include your primary Cardiologist (physician) and Advanced Practice Providers (APPs- Physician Assistants and Nurse Practitioners) who all work together to provide you with the care you need, when you need it.   You may see any of the following providers on your designated Care Team at your next follow up: Dr Glori Bickers Dr Haynes Kerns, NP Lyda Jester, [place] [facility] [place], [place] Audry Riles, PharmD   Please be sure to bring in all your medications bottles to every appointment.

## 2021-12-14 ENCOUNTER — Telehealth (HOSPITAL_COMMUNITY): Payer: Self-pay

## 2021-12-14 ENCOUNTER — Ambulatory Visit: Payer: Medicare HMO | Admitting: Pulmonary Disease

## 2021-12-14 NOTE — Telephone Encounter (Addendum)
Pt aware, agreeable, and verbalized understanding   ----- Message from [name], FNP sent at 12/13/2021  4:13 PM EDT ----- K is low today. She is currently on 40 KCL daily.  Please have her increase KCL to 40 bid (not 40 q am/20 q pm as discussed at visit today).  She has repeat BMET arranged.

## 2021-12-18 ENCOUNTER — Ambulatory Visit (INDEPENDENT_AMBULATORY_CARE_PROVIDER_SITE_OTHER): Payer: Medicare HMO | Admitting: Pulmonary Disease

## 2021-12-18 ENCOUNTER — Telehealth: Payer: Self-pay

## 2021-12-18 ENCOUNTER — Encounter: Payer: Self-pay | Admitting: Pulmonary Disease

## 2021-12-18 VITALS — BP 144/82 | HR 68 | Temp 98.2°F | Ht 61.0 in | Wt 190.6 lb

## 2021-12-18 DIAGNOSIS — G4733 Obstructive sleep apnea (adult) (pediatric): Secondary | ICD-10-CM | POA: Diagnosis not present

## 2021-12-18 DIAGNOSIS — Z9989 Dependence on other enabling machines and devices: Secondary | ICD-10-CM | POA: Diagnosis not present

## 2021-12-18 NOTE — Chronic Care Management (AMB) (Signed)
   [name] was reminded to have all medications, supplements and any blood glucose and blood pressure readings available for review with [name], Pharm. D, at her telephone visit on 12-20-2021 at 9:00.   [place] Pharmacist Assistant [phone]

## 2021-12-20 ENCOUNTER — Telehealth: Payer: Self-pay

## 2021-12-20 ENCOUNTER — Other Ambulatory Visit: Payer: Self-pay

## 2021-12-20 ENCOUNTER — Telehealth: Payer: Medicare HMO

## 2021-12-20 ENCOUNTER — Telehealth (HOSPITAL_COMMUNITY): Payer: Self-pay | Admitting: *Deleted

## 2021-12-20 MED ORDER — DEXLANSOPRAZOLE 60 MG PO CPDR: 1.0000 | DELAYED_RELEASE_CAPSULE | Freq: Every day | ORAL | 2 refills | Status: DC

## 2021-12-20 MED ORDER — ONDANSETRON HCL 4 MG PO TABS: 4.0000 mg | ORAL_TABLET | Freq: Three times a day (TID) | ORAL | 1 refills | Status: DC | PRN

## 2021-12-20 NOTE — Chronic Care Management (AMB) (Signed)
12-20-2021: Completed [employer] reorder form for Ozempic dose increase to 2 mg weekly. Uploaded form to be signed and faxed.   [place] Pharmacist Assistant [phone]

## 2021-12-20 NOTE — Telephone Encounter (Signed)
V.O given ti April w/[facility] for skilled nursing.  Call back # [phone]

## 2021-12-25 ENCOUNTER — Ambulatory Visit (HOSPITAL_COMMUNITY)
Admission: RE | Admit: 2021-12-25 | Discharge: 2021-12-25 | Disposition: A | Discharge status: Home / Self Care | Payer: Medicare HMO | Source: Ambulatory Visit | Attending: Cardiology | Admitting: Cardiology

## 2021-12-25 DIAGNOSIS — I5032 Chronic diastolic (congestive) heart failure: Secondary | ICD-10-CM

## 2021-12-25 LAB — BASIC METABOLIC PANEL
Anion gap: 10 (ref 5–15)
BUN: 14 mg/dL (ref 8–23)
CO2: 28 mmol/L (ref 22–32)
Calcium: 9.2 mg/dL (ref 8.9–10.3)
Chloride: 100 mmol/L (ref 98–111)
Creatinine, Ser: 1.08 mg/dL — ABNORMAL HIGH (ref 0.44–1.00)
GFR, Estimated: 54 mL/min — ABNORMAL LOW (ref >60)
Glucose, Bld: 150 mg/dL — ABNORMAL HIGH (ref 70–99)
Potassium: 3.6 mmol/L (ref 3.5–5.1)
Sodium: 138 mmol/L (ref 135–145)

## 2021-12-27 ENCOUNTER — Ambulatory Visit (INDEPENDENT_AMBULATORY_CARE_PROVIDER_SITE_OTHER): Payer: Medicare HMO

## 2021-12-27 DIAGNOSIS — I25118 Atherosclerotic heart disease of native coronary artery with other forms of angina pectoris: Secondary | ICD-10-CM

## 2021-12-27 DIAGNOSIS — E1122 Type 2 diabetes mellitus with diabetic chronic kidney disease: Secondary | ICD-10-CM

## 2021-12-27 NOTE — Patient Instructions (Addendum)
Visit Information It was great speaking with you today!  Please let me know if you have any questions about our visit.   Goals Addressed             This Visit's Progress    Manage My Medicine       Timeframe:  Long-Range Goal Priority:  High Start Date:                             Expected End Date:                       Follow Up Date 03/28/2022   In Progress:  - call for medicine refill 2 or 3 days before it runs out - call if I am sick and can't take my medicine - keep a list of all the medicines I take; vitamins and herbals too - learn to read medicine labels - use an alarm clock or phone to remind me to take my medicine    Why is this important?   These steps will help you keep on track with your medicines.   Notes:  Please call if you have any questions about your medications.       Patient Care Plan: CCM Pharmacy Care Plan     Problem Identified: HLD, DM II      Long-Range Goal: Disease Management   Note:   Current Barriers:  Unable to independently monitor therapeutic efficacy  Pharmacist Clinical Goal(s):  Patient will achieve adherence to monitoring guidelines and medication adherence to achieve therapeutic efficacy through collaboration with PharmD and provider.   Interventions: 1:1 collaboration with [name], MD regarding development and update of comprehensive plan of care as evidenced by provider attestation and co-signature Inter-disciplinary care team collaboration (see longitudinal plan of care) Comprehensive medication review performed; medication list updated in electronic medical record   Hyperlipidemia: (LDL goal < 55) -Uncontrolled -Current treatment: Atorvastatin 10 mg tablet once per day Appropriate, Query effective,  -Current dietary patterns: she is limiting fried and fatty foods.  -Current exercise habits: she is exercising at least  5 days per week for at least 20 minutes  -Educated on Benefits of statin for ASCVD risk  reduction; -Recommended to continue current medication   Diabetes (A1c goal <7%) -Uncontrolled -Current medications: Humalog 100 unit/ml - inject 2-4 units into the skin three times daily as needed for BS over 150 Appropriate, Effective, Safe, Accessible Toujeo 300 unit/ml -inject 25 units daily  Appropriate, Effective, Safe, Accessible Ozempic 1 mg inject in to the skin once per week  Appropriate, Query Effective -Current home glucose readings fasting glucose: 74, 107, 89, 103, 92 -Denies hypoglycemic/hyperglycemic symptoms -Current meal patterns: will discuss during next office visit  -Current exercise: she is walking 10-20 lapse around her community  -Educated on Complications of diabetes including kidney damage, retinal damage, and cardiovascular disease; Patients Ozempic to be increased to 2 mg per PCP request  Carbohydrate counting and/or plate method -Counseled to check feet daily and get yearly eye exams -Recommended patient call with any questions she might have about her diabetes regimen   Patient Goals/Self-Care Activities Patient will:  - take medications as prescribed as evidenced by patient report and record review  Follow Up Plan: The patient has been provided with contact information for the care management team and has been advised to call with any health related questions or concerns.     Patient  agreed to services and verbal consent obtained.   The patient verbalized understanding of instructions, educational materials, and care plan provided today and agreed to receive a mailed copy of patient instructions, educational materials, and care plan.   [name], PharmD Clinical Pharmacist [facility] [phone]

## 2021-12-27 NOTE — Progress Notes (Addendum)
Chronic Care Management Pharmacy Note  12/27/2021 Name:  [name] MRN:  [id number] DOB:  01-30-1946  Summary: Patient reports that she is doing well with her medication. She has a goal to lose 10 more pounds or more.  Recommendations/Changes made from today's visit: Recommend patients atorvastatin be increased to 20 mg tablet, due to ASCVD.  Plan: Collaborate with PCP to increase patients atorvastatin dose.    Subjective: [name] is an 76 y.o. year old female who is a primary patient of [name], MD.  The CCM team was consulted for assistance with disease management and care coordination needs.    Engaged with patient by telephone for follow up visit in response to provider referral for pharmacy case management and/or care coordination services.   Consent to Services:  The patient was given information about Chronic Care Management services, agreed to services, and gave verbal consent prior to initiation of services.  Please see initial visit note for detailed documentation.   Patient Care Team: [name], MD as PCP - General (Internal Medicine) Leonie Man, MD as PCP - Cardiology (Cardiology) Conni Slipper, NP as Referring Physician (Nurse Practitioner) Arta Silence, MD as Attending Physician (Gastroenterology) Elayne Snare, MD as Attending Physician (Endocrinology) Matilde Bash, MD as Referring Physician (Neurology) Inocencio Homes, DPM as Consulting Physician (Podiatry) Cameron Sprang, MD as Consulting Physician (Neurology)  Recent office visits: 12/12/2021 PCP OV  Recent consult visits: 12/18/2021 Pulmonary OV 11/01/2021 Cardiology [facility] visits: None in previous 6 months   Objective:  Lab Results  Component Value Date   CREATININE 1.08 (H) 12/25/2021   BUN 14 12/25/2021   GFR 86.68 05/15/2016   EGFR 62 08/17/2021   GFRNONAA 54 (L) 12/25/2021   GFRAA 75 06/06/2020   NA 138 12/25/2021   K 3.6 12/25/2021   CALCIUM 9.2  12/25/2021   CO2 28 12/25/2021   GLUCOSE 150 (H) 12/25/2021    Lab Results  Component Value Date/Time   HGBA1C 7.3 (H) 12/12/2021 05:13 PM   HGBA1C 7.6 (H) 08/17/2021 12:17 PM   FRUCTOSAMINE 290 (H) 05/15/2016 01:35 PM   FRUCTOSAMINE 282 07/01/2015 10:41 AM   GFR 86.68 05/15/2016 01:35 PM   GFR 82.17 04/17/2016 09:55 AM   MICROALBUR 10 06/06/2020 01:23 PM   MICROALBUR 10 01/06/2020 12:16 PM    Last diabetic Eye exam:  Lab Results  Component Value Date/Time   HMDIABEYEEXA No Retinopathy 02/13/2021 12:00 AM    Last diabetic Foot exam: No results found for: "HMDIABFOOTEX"   Lab Results  Component Value Date   CHOL 145 05/04/2021   HDL 79 05/04/2021   LDLCALC 63 05/04/2021   LDLDIRECT 66.0 01/27/2015   TRIG 16 05/04/2021   CHOLHDL 1.8 05/04/2021       Latest Ref Rng & Units 08/17/2021   12:17 PM 04/01/2021    8:55 AM 03/31/2021    8:44 PM  Hepatic Function  Total Protein 6.0 - 8.5 g/dL 7.4  6.6  6.6   Albumin 3.7 - 4.7 g/dL 4.2  3.1  3.2   AST 0 - 40 IU/L 19  17  20    ALT 0 - 32 IU/L 12  14  14    Alk Phosphatase 44 - 121 IU/L 120  88  102   Total Bilirubin 0.0 - 1.2 mg/dL 0.3  1.5  0.2     Lab Results  Component Value Date/Time   TSH 2.010 03/27/2021 03:36 PM   TSH 1.500 11/09/2019 12:21 AM  TSH 1.332 02/25/2019 03:32 AM   TSH 0.768 08/13/2010 08:36 AM       Latest Ref Rng & Units 05/04/2021   10:14 AM 04/10/2021   12:31 PM 04/03/2021   12:16 AM  CBC  WBC 4.0 - 10.5 K/uL 4.5  5.3  6.2   Hemoglobin 12.0 - 15.0 g/dL 10.1  9.8  9.7   Hematocrit 36.0 - 46.0 % 33.6  32.1  30.6   Platelets 150 - 400 K/uL 312  352  259     No results found for: "VD25OH"  Clinical ASCVD: Yes  The 10-year ASCVD risk score (Arnett DK, et al., 2019) is: 30.1%   Values used to calculate the score:     Age: 17 years     Sex: Female     Is Non-Hispanic African American: Yes     Diabetic: Yes     Tobacco smoker: No     Systolic Blood Pressure: 383 mmHg     Is BP treated: Yes      HDL Cholesterol: 79 mg/dL     Total Cholesterol: 145 mg/dL       12/12/2021    4:14 PM 07/06/2021    3:18 PM 04/10/2021   11:58 AM  Depression screen PHQ 2/9  Decreased Interest 0 1 0  Down, Depressed, Hopeless 0 1 0  PHQ - 2 Score 0 2 0  Altered sleeping 0 3 3  Tired, decreased energy 0 3 1  Change in appetite 0 0 3  Feeling bad or failure about yourself  0 0 0  Trouble concentrating 0 0 3  Moving slowly or fidgety/restless 0 0 0  Suicidal thoughts 0 0 1  PHQ-9 Score 0 8 11  Difficult doing work/chores Not difficult at all Not difficult at all Somewhat difficult     Social History   Tobacco Use  Smoking Status Former   Packs/day: 0.50   Years: 25.00   Total pack years: 12.50   Types: Cigarettes  Smokeless Tobacco Never  Tobacco Comments   quit smoking 20+ytrs ago   BP Readings from Last 3 Encounters:  12/18/21 (!) 144/82  12/13/21 124/68  12/12/21 130/80   Pulse Readings from Last 3 Encounters:  12/18/21 68  12/13/21 74  12/12/21 68   Wt Readings from Last 3 Encounters:  12/18/21 190 lb 9.6 oz (86.5 kg)  12/13/21 188 lb (85.3 kg)  12/12/21 188 lb 9.6 oz (85.5 kg)   BMI Readings from Last 3 Encounters:  12/18/21 36.01 kg/m  12/13/21 35.52 kg/m  12/12/21 35.64 kg/m    Assessment/Interventions: Review of patient past medical history, allergies, medications, health status, including review of consultants reports, laboratory and other test data, was performed as part of comprehensive evaluation and provision of chronic care management services.   SDOH:  (Social Determinants of Health) assessments and interventions performed: No  SDOH Screenings   Alcohol Screen: Not on file  Depression (PHQ2-9): Low Risk  (12/12/2021)   Depression (PHQ2-9)    PHQ-2 Score: 0  Financial Resource Strain: Low Risk  (07/06/2021)   Overall Financial Resource Strain (CARDIA)    Difficulty of Paying Living Expenses: Not hard at all  Food Insecurity: No Food Insecurity  (07/06/2021)   Hunger Vital Sign    Worried About Running Out of Food in the Last Year: Never true    Ran Out of Food in the Last Year: Never true  Housing: Low Risk  (03/11/2019)   [place]  Risk Score: 0  Physical Activity: Inactive (07/06/2021)   Exercise Vital Sign    Days of Exercise per Week: 0 days    Minutes of Exercise per Session: 0 min  Social Connections: Not on file  Stress: No Stress Concern Present (07/06/2021)   [place]    Feeling of Stress : Only a little  Tobacco Use: Medium Risk (12/18/2021)   Patient History    Smoking Tobacco Use: Former    Smokeless Tobacco Use: Never    Passive Exposure: Not on file  Transportation Needs: No Transportation Needs (07/06/2021)   PRAPARE - Hydrologist (Medical): No    Lack of Transportation (Non-Medical): No  Recent Concern: Transportation Needs - Unmet Transportation Needs (04/26/2021)   PRAPARE - Hydrologist (Medical): Yes    Lack of Transportation (Non-Medical): Yes    CCM Care Plan  Allergies  Allergen Reactions   Other Anaphylaxis and Rash    Bleach   Penicillins Hives    Did it involve swelling of the face/tongue/throat, SOB, or low BP? No Did it involve sudden or severe rash/hives, skin peeling, or any reaction on the inside of your mouth or nose? Yes Did you need to seek medical attention at a hospital or doctor's office? Yes When did it last happen?      Childhood allergy  If all above answers are "NO", may proceed with cephalosporin use.     Sulfa Antibiotics Hives   Aspirin Other (See Comments)     On aspirin 81 mg - Rectal bleeding in dec 2018   Codeine     Headache and makes the patient feel "off"    Medications Reviewed Today     Reviewed by Elton Sin, LPN (Licensed Practical Nurse) on 12/18/21 at 1207  Med List Status: <None>   Medication Order Taking? Sig  Documenting Provider Last Dose Status Informant  Accu-Chek Softclix Lancets lancets [id number] Yes Use as instructed to check blood sugars twice daily e11.69 [name], MD Taking Active   Accu-Chek Softclix Lancets lancets [id number] Yes Use as instructed to check blood sugars three times daily E11.69 [name], MD Taking Active   acetaminophen (TYLENOL) 500 MG tablet [id number] Yes Take 500-1,000 mg by mouth every 4 (four) hours as needed for moderate pain.  [provider] Taking Active Self  albuterol (VENTOLIN HFA) 108 (90 Base) MCG/ACT inhaler [id number] Yes Inhale 1-2 puffs into the lungs every 6 (six) hours as needed for wheezing or shortness of breath. Marshell Garfinkel, MD Taking Active   Alcohol Swabs (DROPSAFE ALCOHOL PREP) 70 % PADS [id number] Yes USE TWO TIMES DAILY AS DIRECTED [name], MD Taking Active   Ascorbic Acid (VITAMIN C) 1000 MG tablet [id number] Yes Take 1,000 mg by mouth daily. [provider] Taking Active Self  atorvastatin (LIPITOR) 10 MG tablet [id number] Yes TAKE 1 TABLET EVERY EVENING. [name], MD Taking Active Self  B-D ULTRAFINE III SHORT PEN 31G X 8 MM MISC [id number] Yes USE DAILY WITH VICTOZA AND/OR NOVOLOG. [name], MD Taking Active   Blood Glucose Calibration (TRUE METRIX LEVEL 1) Low SOLN [id number] Yes Use as directed  Dx:e11.22 [name], MD Taking Active Self  Blood Glucose Monitoring Suppl (TRUE METRIX METER) w/Device KIT [id number] Yes USE AS DIRECTED [name], MD Taking Active   Budeson-Glycopyrrol-Formoterol (BREZTRI AEROSPHERE) 160-9-4.8 MCG/ACT AERO [id number] Yes Inhale 2 puffs into the lungs in the  morning and at bedtime. [name], MD Taking Active   carvedilol (COREG) 12.5 MG tablet [id number] Yes Take 1 tablet (12.5 mg total) by mouth 2 (two) times daily. Larey Dresser, MD Taking Active   dexlansoprazole (DEXILANT) 60 MG capsule [id number] Yes TAKE 1 CAPSULE EVERY DAY [name], MD Taking  Active   diclofenac sodium (VOLTAREN) 1 % GEL [id number] Yes Apply 2 g topically 4 (four) times daily as needed (pain). Geradine Girt, DO Taking Active Self  ferrous sulfate 324 MG TBEC [id number] Yes Patient takes 1 tablet by mouth every other day. [provider] Taking Active Self  furosemide (LASIX) 20 MG tablet [id number] Yes Take 4 tablets (80 mg total) by mouth in the morning AND 3 tablets (60 mg total) every evening. Celoron, Turpin, FNP Taking Active   Glucagon (GVOKE HYPOPEN 1-PACK) 0.5 MG/0.1ML Darden Palmer [id number] Yes Inject 0.5 mg into the skin daily as needed. [name], MD Taking Active Self           Med Note Gilman Buttner, Marissa Calamity May 04, 2021  9:33 AM)    glucose blood (TRUE METRIX BLOOD GLUCOSE TEST) test strip [id number] Yes USE AS DIRECTED TO CHECK BLOOD SUGARS 2 TIMES PER DAY [name], MD Taking Active Self  guaiFENesin-dextromethorphan [facility] DM) 100-10 MG/5ML syrup [id number] Yes Take 10 mLs by mouth every 6 (six) hours as needed for cough. [place] Eric J, DO Taking Active Self  hydrALAZINE (APRESOLINE) 100 MG tablet [id number] Yes Take 1 tablet (100 mg total) by mouth 3 (three) times daily. Larey Dresser, MD Taking Active   hydrocortisone cream 1 % [id number] Yes Apply 1 application topically daily as needed for itching.  [provider] Taking Active Self  insulin glargine, 1 Unit Dial, (TOUJEO SOLOSTAR) 300 UNIT/ML Solostar Pen [id number] Yes Inject 25 Units into the skin daily. Minette Brine, FNP Taking Active   insulin lispro (HUMALOG KWIKPEN) 100 UNIT/ML KwikPen [id number] Yes Inject 0.02-0.04 mLs (2-4 Units total) into the skin 3 (three) times daily as needed (high blood sugar over 150). Bary Castilla, NP Taking Active Self  Isopropyl Alcohol (ALCOHOL WIPES) 70 % MISC [id number] Yes Apply 1 each topically 2 (two) times daily. Minette Brine, FNP Taking Active Self  isosorbide mononitrate (IMDUR) 30 MG 24 hr tablet [id number] Yes TAKE 1  TABLET EVERY DAY (APPOINTMENT IS NEEDED FOR REFILLS) Leonie Man, MD Taking Active Self  lamoTRIgine (LAMICTAL) 150 MG tablet [id number] Yes Take 1 tablet (150 mg total) by mouth 2 (two) times daily. Cameron Sprang, MD Taking Active   levETIRAcetam (KEPPRA) 500 MG tablet [id number] Yes Take 1,000 mg by mouth at bedtime. [provider] Taking Active   ondansetron (ZOFRAN) 4 MG tablet [id number] Yes Take 4 mg by mouth every 8 (eight) hours as needed for nausea or vomiting. [provider] Taking Active Self           Med Note Olivia Mackie May 04, 2021  9:37 AM)    OZEMPIC, 1 MG/DOSE, 4 MG/3ML SOPN [id number] Yes INJECT 1 MG INTO THE SKIN ONCE A WEEK. [name], MD Taking Active   Pancrelipase, Lip-Prot-Amyl, 25000-79000 units CPEP [id number] Yes Take 1-2 capsules by mouth See admin instructions. Take 1 capsules in the morning with breakfast, 1 capsule with lunch, and 2 capsules with supper [provider] Taking Active Self           Med Note Gilman Buttner, Dyann Ruddle M   Wed  Jan 11, 2021 11:55 AM)    potassium chloride SA (KLOR-CON M) 20 MEQ tablet [id number] Yes Take 2 tablets (40 mEq total) by mouth in the morning AND 1 tablet (20 mEq total) every evening. With additional 60 on Metolazone days. Dike, Maricela Bo, FNP Taking Active   sennosides-docusate sodium (SENOKOT-S) 8.6-50 MG tablet [id number] Yes Take 1-2 tablets by mouth at bedtime as needed for constipation. [provider] Taking Active Self  sertraline (ZOLOFT) 50 MG tablet [id number] Yes TAKE 1 TABLET EVERY DAY [name], MD Taking Active   tolnaftate (TINACTIN) 1 % cream [id number] Yes Apply 1 application topically daily as needed (foot fungus).  [provider] Taking Active Self  TRUEplus Lancets 33G [place] [id number] Yes TEST BLOOD SUGAR TWICE DAILY AS DIRECTED [name], MD Taking Active             Patient Active Problem List   Diagnosis Date Noted   Acute on  chronic blood loss anemia 04/01/2021   Hypokalemia 04/01/2021   SOB (shortness of breath) 11/14/2020   Postinflammatory pulmonary fibrosis ([place]) 06/08/2020   Lung nodule 06/08/2020   Chronic kidney disease, stage 2 (mild) 01/05/2020   Hypertensive chronic kidney disease with stage 1 through stage 4 chronic kidney disease, or unspecified chronic kidney disease 01/05/2020   Snoring 01/05/2020   Neuroleptic-induced parkinsonism ([place]) 09/28/2019   Class 3 severe obesity due to excess calories with serious comorbidity and body mass index (BMI) of 40.0 to 44.9 in adult ([place]) 05/22/2019   Pulmonary hypertension, unspecified ([place]) 04/06/2019   Syncope 02/24/2019   Rheumatoid arthritis ([place]) 01/21/2019   Radial styloid tenosynovitis 11/24/2018   Type 2 diabetes mellitus with stage 2 chronic kidney disease, with long-term current use of insulin ([place]) 07/21/2018   Chest pain 05/15/2018   Excessive daytime sleepiness 01/30/2018   Sleep-related hypoventilation 01/30/2018   Recurrent depression ([place]) 11/06/2017   OSA on CPAP 10/07/2017   Todd's paralysis ([place])    Seizure ([place]) 06/17/2017   Chronic diastolic CHF (congestive heart failure), NYHA class 2 (HCC)    Chronic respiratory failure with hypoxia (HCC)    Acute lower GI bleeding    Stroke-like symptoms 06/15/2017   Rectal bleeding 06/15/2017   Dehydration 03/07/2017   Medication management 12/24/2016   Near syncope 03/09/2016   Racing heart beat 03/09/2016   Dyspnea 01/28/2016   Spinal stenosis at L4-L5 level 10/04/2014   Parkinsonism ([place]) 06/24/2013   Lower abdominal pain 10/31/2012   DM2 (diabetes mellitus, type 2) ([place]) 10/31/2012   Seizure disorder ([place]) 10/31/2012   Diverticulosis 10/31/2012   Anemia associated with acute blood loss 08/14/2012   Hematochezia 08/14/2012   Varicose veins of bilateral lower extremities with other complications 18/56/3149   Leg pain 03/21/2012   Radicular pain of left lower extremity 03/21/2012    Heart murmur 11/21/2011   Infected pseudocyst of pancreas 10/28/2011   Hypertensive heart disease with chronic diastolic congestive heart failure ([place]) 10/28/2011   Dyslipidemia, goal LDL below 100 10/28/2011   Morbid obesity with BMI of 45.0-49.9, adult ([place]) 10/28/2011   Pseudoseizure 10/24/2011   Gastroesophageal reflux 10/23/2011   History of tobacco abuse 10/23/2011   Hypoxemia 10/23/2011   Disturbance in sleep behavior 09/03/2011   Partial epilepsy with impairment of consciousness ([place]) 09/03/2011   Tremor 09/03/2011   Peripheral edema 04/14/2011   Obesity hypoventilation syndrome ([place]) 10/26/2010   Atherosclerotic heart disease of native coronary artery with other forms of angina pectoris ([place]) 08/28/2010   Acute, but ill-defined,  cerebrovascular disease 05/04/2008   Arthropathy 05/04/2008   Disequilibrium 05/04/2008   Generalized tonic clonic epilepsy ([place]) 05/04/2008   Increased frequency of urination 05/04/2008   Insomnia, unspecified 05/04/2008   Major depressive disorder, single episode, unspecified 05/04/2008   Memory loss 05/04/2008   Nausea 05/04/2008   Other malaise and fatigue 05/04/2008   Swelling of limb 05/04/2008    Immunization History  Administered Date(s) Administered   Fluad Quad(high Dose 65+) 06/06/2020, 03/27/2021   Influenza Split 04/11/2011, 06/30/2013   Influenza, High Dose Seasonal PF 02/14/2017, 04/24/2018   Influenza-Unspecified 04/07/2014   PFIZER(Purple Top)SARS-COV-2 Vaccination 08/06/2019, 08/31/2019, 05/04/2020, 11/08/2020   PNEUMOCOCCAL CONJUGATE-20 11/08/2020   Pfizer Covid-19 Vaccine Bivalent Booster 45yr & up 07/07/2021   Pneumococcal Polysaccharide-23 06/25/2010   Tdap 05/18/2012   Zoster Recombinat (Shingrix) 07/07/2021, 12/12/2021   Zoster, Live 04/14/2014    Conditions to be addressed/monitored:  Hyperlipidemia and Diabetes  Care Plan : [address] Updates made by PMayford Knife RPits[place]since 12/27/2021 12:00 AM      Problem: HLD, DM II      Long-Range Goal: Disease Management   Note:   Current Barriers:  Unable to independently monitor therapeutic efficacy  Pharmacist Clinical Goal(s):  Patient will achieve adherence to monitoring guidelines and medication adherence to achieve therapeutic efficacy through collaboration with PharmD and provider.   Interventions: 1:1 collaboration with SGlendale Chard MD regarding development and update of comprehensive plan of care as evidenced by provider attestation and co-signature Inter-disciplinary care team collaboration (see longitudinal plan of care) Comprehensive medication review performed; medication list updated in electronic medical record   Hyperlipidemia: (LDL goal < 55) -Uncontrolled -Current treatment: Atorvastatin 10 mg tablet once per day Appropriate, Query effective,  -Current dietary patterns: she is limiting fried and fatty foods.  -Current exercise habits: she is exercising at least  5 days per week for at least 20 minutes  -Educated on Benefits of statin for ASCVD risk reduction; -Recommended to continue current medication   Diabetes (A1c goal <7%) -Uncontrolled -Current medications: Humalog 100 unit/ml - inject 2-4 units into the skin three times daily as needed for BS over 150 Appropriate, Effective, Safe, Accessible Toujeo 300 unit/ml -inject 25 units daily  Appropriate, Effective, Safe, Accessible Ozempic 1 mg inject in to the skin once per week  Appropriate, Query Effective -Current home glucose readings fasting glucose: 74, 107, 89, 103, 92 -Denies hypoglycemic/hyperglycemic symptoms -Current meal patterns: will discuss during next office visit  -Current exercise: she is walking 10-20 lapse around her community  -Educated on Complications of diabetes including kidney damage, retinal damage, and cardiovascular disease; Patients Ozempic to be increased to 2 mg per PCP request -Counseled to check feet daily and get yearly  eye exams -Recommended patient call with any questions she might have about her diabetes regimen   Patient Goals/Self-Care Activities Patient will:  - take medications as prescribed as evidenced by patient report and record review  Follow Up Plan: The patient has been provided with contact information for the care management team and has been advised to call with any health related questions or concerns.       Medication Assistance:  Ozmepic and Toujeo obtained through [employer] medication assistance program.  Enrollment ends 05/2022  Compliance/Adherence/Medication fill history: Care Gaps: No Care Gaps noted   Star-Rating Drugs: Atorvastatin 10 mg tablet  Ozempic 1 mg  Patient's [facility] pharmacy is:  [facility] [address]  [place] [address] Phone: [phone] Fax: [phone]  [place] [place] [address] [place] [address] Phone: [phone] Fax: [phone]  Uses pill box? Yes Pt endorses 95% compliance  We discussed: Current pharmacy is preferred with insurance plan and patient is satisfied with pharmacy services Patient decided to: Continue current medication management strategy  Care Plan and Follow Up Patient Decision:  Patient agrees to Care Plan and Follow-up.  Plan: The patient has been provided with contact information for the care management team and has been advised to call with any health related questions or concerns.   [name], CPP, PharmD Clinical Pharmacist Practitioner [facility] [phone]

## 2021-12-28 ENCOUNTER — Emergency Department (HOSPITAL_COMMUNITY): Payer: Medicare HMO

## 2021-12-28 ENCOUNTER — Emergency Department (HOSPITAL_COMMUNITY)
Admission: EM | Admit: 2021-12-28 | Discharge: 2021-12-28 | Disposition: A | Discharge status: Home / Self Care | Payer: Medicare HMO | Attending: Emergency Medicine | Admitting: Emergency Medicine

## 2021-12-28 ENCOUNTER — Encounter (HOSPITAL_COMMUNITY): Payer: Self-pay

## 2021-12-28 ENCOUNTER — Other Ambulatory Visit: Payer: Self-pay

## 2021-12-28 DIAGNOSIS — I509 Heart failure, unspecified: Secondary | ICD-10-CM | POA: Insufficient documentation

## 2021-12-28 DIAGNOSIS — F444 Conversion disorder with motor symptom or deficit: Secondary | ICD-10-CM | POA: Diagnosis not present

## 2021-12-28 DIAGNOSIS — D649 Anemia, unspecified: Secondary | ICD-10-CM | POA: Insufficient documentation

## 2021-12-28 DIAGNOSIS — G20 Parkinson's disease: Secondary | ICD-10-CM | POA: Diagnosis not present

## 2021-12-28 DIAGNOSIS — N189 Chronic kidney disease, unspecified: Secondary | ICD-10-CM | POA: Insufficient documentation

## 2021-12-28 DIAGNOSIS — I959 Hypotension, unspecified: Secondary | ICD-10-CM | POA: Diagnosis not present

## 2021-12-28 DIAGNOSIS — R4020 Unspecified coma: Secondary | ICD-10-CM | POA: Diagnosis not present

## 2021-12-28 DIAGNOSIS — R4182 Altered mental status, unspecified: Secondary | ICD-10-CM

## 2021-12-28 DIAGNOSIS — R29818 Other symptoms and signs involving the nervous system: Secondary | ICD-10-CM | POA: Diagnosis not present

## 2021-12-28 DIAGNOSIS — R29810 Facial weakness: Secondary | ICD-10-CM | POA: Diagnosis not present

## 2021-12-28 DIAGNOSIS — I639 Cerebral infarction, unspecified: Secondary | ICD-10-CM | POA: Diagnosis not present

## 2021-12-28 DIAGNOSIS — R262 Difficulty in walking, not elsewhere classified: Secondary | ICD-10-CM | POA: Diagnosis not present

## 2021-12-28 DIAGNOSIS — R531 Weakness: Secondary | ICD-10-CM | POA: Insufficient documentation

## 2021-12-28 DIAGNOSIS — E1122 Type 2 diabetes mellitus with diabetic chronic kidney disease: Secondary | ICD-10-CM | POA: Diagnosis not present

## 2021-12-28 DIAGNOSIS — I6523 Occlusion and stenosis of bilateral carotid arteries: Secondary | ICD-10-CM | POA: Diagnosis not present

## 2021-12-28 DIAGNOSIS — Z20822 Contact with and (suspected) exposure to covid-19: Secondary | ICD-10-CM | POA: Diagnosis not present

## 2021-12-28 DIAGNOSIS — Z79899 Other long term (current) drug therapy: Secondary | ICD-10-CM | POA: Insufficient documentation

## 2021-12-28 LAB — RAPID URINE DRUG SCREEN, HOSP PERFORMED
Amphetamines: NOT DETECTED
Barbiturates: NOT DETECTED
Benzodiazepines: NOT DETECTED
Cocaine: NOT DETECTED
Opiates: NOT DETECTED
Tetrahydrocannabinol: NOT DETECTED

## 2021-12-28 LAB — URINALYSIS, ROUTINE W REFLEX MICROSCOPIC
Bilirubin Urine: NEGATIVE
Glucose, UA: NEGATIVE mg/dL
Hgb urine dipstick: NEGATIVE
Ketones, ur: NEGATIVE mg/dL
Leukocytes,Ua: NEGATIVE
Nitrite: NEGATIVE
Protein, ur: NEGATIVE mg/dL
Specific Gravity, Urine: 1.008 (ref 1.005–1.030)
pH: 7 (ref 5.0–8.0)

## 2021-12-28 LAB — I-STAT CHEM 8, ED
BUN: 14 mg/dL (ref 8–23)
Calcium, Ion: 1.05 mmol/L — ABNORMAL LOW (ref 1.15–1.40)
Chloride: 100 mmol/L (ref 98–111)
Creatinine, Ser: 1 mg/dL (ref 0.44–1.00)
Glucose, Bld: 187 mg/dL — ABNORMAL HIGH (ref 70–99)
HCT: 32 % — ABNORMAL LOW (ref 36.0–46.0)
Hemoglobin: 10.9 g/dL — ABNORMAL LOW (ref 12.0–15.0)
Potassium: 3.5 mmol/L (ref 3.5–5.1)
Sodium: 138 mmol/L (ref 135–145)
TCO2: 25 mmol/L (ref 22–32)

## 2021-12-28 LAB — DIFFERENTIAL
Abs Immature Granulocytes: 0.01 10*3/uL (ref 0.00–0.07)
Basophils Absolute: 0 10*3/uL (ref 0.0–0.1)
Basophils Relative: 0 %
Eosinophils Absolute: 0.1 10*3/uL (ref 0.0–0.5)
Eosinophils Relative: 2 %
Immature Granulocytes: 0 %
Lymphocytes Relative: 26 %
Lymphs Abs: 1.2 10*3/uL (ref 0.7–4.0)
Monocytes Absolute: 0.5 10*3/uL (ref 0.1–1.0)
Monocytes Relative: 10 %
Neutro Abs: 2.9 10*3/uL (ref 1.7–7.7)
Neutrophils Relative %: 62 %

## 2021-12-28 LAB — COMPREHENSIVE METABOLIC PANEL
ALT: 13 U/L (ref 0–44)
AST: 19 U/L (ref 15–41)
Albumin: 3.4 g/dL — ABNORMAL LOW (ref 3.5–5.0)
Alkaline Phosphatase: 98 U/L (ref 38–126)
Anion gap: 9 (ref 5–15)
BUN: 13 mg/dL (ref 8–23)
CO2: 26 mmol/L (ref 22–32)
Calcium: 8.6 mg/dL — ABNORMAL LOW (ref 8.9–10.3)
Chloride: 102 mmol/L (ref 98–111)
Creatinine, Ser: 1.01 mg/dL — ABNORMAL HIGH (ref 0.44–1.00)
GFR, Estimated: 59 mL/min — ABNORMAL LOW (ref >60)
Glucose, Bld: 188 mg/dL — ABNORMAL HIGH (ref 70–99)
Potassium: 3.5 mmol/L (ref 3.5–5.1)
Sodium: 137 mmol/L (ref 135–145)
Total Bilirubin: 0.4 mg/dL (ref 0.3–1.2)
Total Protein: 7.1 g/dL (ref 6.5–8.1)

## 2021-12-28 LAB — CBG MONITORING, ED: Glucose-Capillary: 201 mg/dL — ABNORMAL HIGH (ref 70–99)

## 2021-12-28 LAB — CBC
HCT: 32.7 % — ABNORMAL LOW (ref 36.0–46.0)
Hemoglobin: 9.8 g/dL — ABNORMAL LOW (ref 12.0–15.0)
MCH: 22.6 pg — ABNORMAL LOW (ref 26.0–34.0)
MCHC: 30 g/dL (ref 30.0–36.0)
MCV: 75.5 fL — ABNORMAL LOW (ref 80.0–100.0)
Platelets: 244 10*3/uL (ref 150–400)
RBC: 4.33 MIL/uL (ref 3.87–5.11)
RDW: 17.1 % — ABNORMAL HIGH (ref 11.5–15.5)
WBC: 4.8 10*3/uL (ref 4.0–10.5)
nRBC: 0 % (ref 0.0–0.2)

## 2021-12-28 LAB — RESP PANEL BY RT-PCR (FLU A&B, COVID) ARPGX2
Influenza A by PCR: NEGATIVE
Influenza B by PCR: NEGATIVE
SARS Coronavirus 2 by RT PCR: NEGATIVE

## 2021-12-28 LAB — PROTIME-INR
INR: 1.2 (ref 0.8–1.2)
Prothrombin Time: 15.1 seconds (ref 11.4–15.2)

## 2021-12-28 LAB — ETHANOL: Alcohol, Ethyl (B): <10 mg/dL (ref <10)

## 2021-12-28 LAB — APTT: aPTT: 39 seconds — ABNORMAL HIGH (ref 24–36)

## 2021-12-28 MED ORDER — IOHEXOL 350 MG/ML SOLN
75.0000 mL | Freq: Once | INTRAVENOUS | Status: AC | PRN
  Administered 2021-12-28: 75 mL via INTRAVENOUS

## 2021-12-28 NOTE — ED Provider Notes (Signed)
  Physical Exam  BP 136/70   Pulse 61   Resp 18   Wt 86 kg   SpO2 98%   BMI 35.82 kg/m   Physical Exam Vitals and nursing note reviewed.  Constitutional:      General: She is not in acute distress.    Appearance: She is well-developed.  HENT:     Right Ear: External ear normal.     Left Ear: External ear normal.     Nose: Nose normal.     Mouth/Throat:     Mouth: Mucous membranes are moist.     Pharynx: Oropharynx is clear.  Eyes:     Extraocular Movements: Extraocular movements intact.     Conjunctiva/sclera: Conjunctivae normal.     Pupils: Pupils are equal, round, and reactive to light.  Cardiovascular:     Rate and Rhythm: Normal rate and regular rhythm.     Heart sounds: No murmur heard. Pulmonary:     Effort: Pulmonary effort is normal. No respiratory distress.     Breath sounds: Normal breath sounds.  Abdominal:     Palpations: Abdomen is soft.     Tenderness: There is no abdominal tenderness.  Musculoskeletal:        General: No swelling.     Cervical back: Neck supple.  Skin:    General: Skin is warm and dry.     Capillary Refill: Capillary refill takes less than 2 seconds.  Neurological:     General: No focal deficit present.     Mental Status: She is alert and oriented to person, place, and time.     Motor: No weakness.  Psychiatric:        Mood and Affect: Mood normal.     Procedures  Procedures  ED Course / MDM    Medical Decision Making Problems Addressed: Weakness: acute illness or injury with systemic symptoms  Amount and/or Complexity of Data Reviewed Independent Historian: EMS External Data Reviewed: notes. Labs: ordered. Radiology: ordered.  Risk Prescription drug management.    Patient is a 76 year old female who came to the emergency department for evaluation of a code stroke.  Patient had been found unresponsive and with subsequent left-sided weakness.  CT head and CTA of the head and neck were unremarkable for acute emergent  findings.  MRI was also performed and did not show any signs of acute stroke.  At the time of handoff from previous provider patient was pending EEG performance and results.  The results of the EEG were communicated from neurology which showed no evidence of acute epileptic activity.  In this circumstance patient was felt appropriate for continued management in the outpatient setting.  She was instructed to continue with her home antiepileptic medications including Lamictal and Keppra.  Lamictal and Keppra level send out labs were ordered and she was instructed to follow-up with her outpatient neurologist for symptom recheck.  Plan and findings discussed with patient at bedside she verbalized understanding was agreeable.  Patient discharged from the ED in stable condition.      Nelta Numbers, MD 12/29/21 0140    Margette Fast, MD 01/02/22 [phone]

## 2021-12-28 NOTE — ED Provider Notes (Signed)
[facility] EMERGENCY DEPARTMENT Provider Note   CSN: [id number] Arrival date & time: 12/28/21  1143  An emergency department physician performed an initial assessment on this suspected stroke patient at 1143.  History  Chief Complaint  Patient presents with   Code Stroke    [name] is a 76 y.o. female with a history of CKD, CHF, OSA, diabetes mellitus, Parkinson's disease, and seizures with prior Todd's paralysis who presents to the emergency department via EMS as a code stroke.  History is primarily provided by EMS, they relay the patient's daughter last saw her normal at 10:45 AM, stepped out of the room and returned to find her unresponsive therefore they called EMS.  On EMS arrival patient initially not verbally responsive, subsequently came around, they noted left-sided facial droop as well as left upper and lower extremity weakness.  Patient denies pain, she states that she think she is okay, states that her left side feels weak and that this started today.  She reports she is otherwise been in her usual state of health  HPI     Home Medications Prior to Admission medications   Medication Sig Start Date End Date Taking? Authorizing Provider  Accu-Chek Softclix Lancets lancets Use as instructed to check blood sugars twice daily e11.69 11/22/21   [name], MD  Accu-Chek Softclix Lancets lancets Use as instructed to check blood sugars three times daily E11.69 12/12/21   [name], MD  acetaminophen (TYLENOL) 500 MG tablet Take 500-1,000 mg by mouth every 4 (four) hours as needed for moderate pain.     [provider]  albuterol (VENTOLIN HFA) 108 (90 Base) MCG/ACT inhaler Inhale 1-2 puffs into the lungs every 6 (six) hours as needed for wheezing or shortness of breath. 06/08/21   Marshell Garfinkel, MD  Alcohol Swabs (DROPSAFE ALCOHOL PREP) 70 % PADS USE TWO TIMES DAILY AS DIRECTED 09/25/21   [name], MD  Ascorbic Acid (VITAMIN C) 1000  MG tablet Take 1,000 mg by mouth daily.    [provider]  atorvastatin (LIPITOR) 10 MG tablet TAKE 1 TABLET EVERY EVENING. 03/23/21   [name], MD  B-D ULTRAFINE III SHORT PEN 31G X 8 MM MISC USE DAILY WITH VICTOZA AND/OR NOVOLOG. 09/25/21   [name], MD  Blood Glucose Calibration (TRUE METRIX LEVEL 1) Low SOLN Use as directed  Dx:e11.22 11/28/20   [name], MD  Blood Glucose Monitoring Suppl (TRUE METRIX METER) w/Device KIT USE AS DIRECTED 05/08/21   [name], MD  Budeson-Glycopyrrol-Formoterol (BREZTRI AEROSPHERE) 160-9-4.8 MCG/ACT AERO Inhale 2 puffs into the lungs in the morning and at bedtime. 09/25/21   [name], MD  carvedilol (COREG) 12.5 MG tablet Take 1 tablet (12.5 mg total) by mouth 2 (two) times daily. 05/30/21 11/02/22  Larey Dresser, MD  dexlansoprazole (DEXILANT) 60 MG capsule Take 1 capsule (60 mg total) by mouth daily. 12/20/21   [name], MD  diclofenac sodium (VOLTAREN) 1 % GEL Apply 2 g topically 4 (four) times daily as needed (pain). 05/17/18   Geradine Girt, DO  ferrous sulfate 324 MG TBEC Patient takes 1 tablet by mouth every other day.    [provider]  furosemide (LASIX) 20 MG tablet Take 4 tablets (80 mg total) by mouth in the morning AND 3 tablets (60 mg total) every evening. 12/13/21   [place], Maricela Bo, FNP  Glucagon (GVOKE HYPOPEN 1-PACK) 0.5 MG/0.1ML SOAJ Inject 0.5 mg into the skin daily as needed. 03/27/21  [name], MD  glucose blood (TRUE METRIX BLOOD GLUCOSE TEST) test strip USE AS DIRECTED TO CHECK BLOOD SUGARS 2 TIMES PER DAY 03/07/21   [name], MD  guaiFENesin-dextromethorphan [facility] DM) 100-10 MG/5ML syrup Take 10 mLs by mouth every 6 (six) hours as needed for cough. 11/18/20   [place] Donnamarie Poag, DO  hydrALAZINE (APRESOLINE) 100 MG tablet Take 1 tablet (100 mg total) by mouth 3 (three) times daily. 05/04/21   Larey Dresser, MD  hydrocortisone cream 1 % Apply 1 application topically daily as  needed for itching.     [provider]  insulin glargine, 1 Unit Dial, (TOUJEO SOLOSTAR) 300 UNIT/ML Solostar Pen Inject 25 Units into the skin daily. 09/06/21   Minette Brine, FNP  insulin lispro (HUMALOG KWIKPEN) 100 UNIT/ML KwikPen Inject 0.02-0.04 mLs (2-4 Units total) into the skin 3 (three) times daily as needed (high blood sugar over 150). 11/23/20   Bary Castilla, NP  Isopropyl Alcohol (ALCOHOL WIPES) 70 % MISC Apply 1 each topically 2 (two) times daily. 06/25/19   Minette Brine, FNP  isosorbide mononitrate (IMDUR) 30 MG 24 hr tablet TAKE 1 TABLET EVERY DAY (APPOINTMENT IS NEEDED FOR REFILLS) 03/13/21   Leonie Man, MD  lamoTRIgine (LAMICTAL) 150 MG tablet Take 1 tablet (150 mg total) by mouth 2 (two) times daily. 09/12/21   Cameron Sprang, MD  levETIRAcetam (KEPPRA) 500 MG tablet Take 1,000 mg by mouth at bedtime.    [provider]  ondansetron (ZOFRAN) 4 MG tablet Take 1 tablet (4 mg total) by mouth every 8 (eight) hours as needed for nausea or vomiting. 12/20/21   [name], MD  OZEMPIC, 1 MG/DOSE, 4 MG/3ML SOPN INJECT 1 MG INTO THE SKIN ONCE A WEEK. 11/02/21   [name], MD  Pancrelipase, Lip-Prot-Amyl, 25000-79000 units CPEP Take 1-2 capsules by mouth See admin instructions. Take 1 capsules in the morning with breakfast, 1 capsule with lunch, and 2 capsules with supper    [provider]  potassium chloride SA (KLOR-CON M) 20 MEQ tablet Take 2 tablets (40 mEq total) by mouth in the morning AND 1 tablet (20 mEq total) every evening. With additional 60 on Metolazone days. 12/13/21   [place], Maricela Bo, FNP  sennosides-docusate sodium (SENOKOT-S) 8.6-50 MG tablet Take 1-2 tablets by mouth at bedtime as needed for constipation.    [provider]  sertraline (ZOLOFT) 50 MG tablet TAKE 1 TABLET EVERY DAY 09/25/21   [name], MD  tolnaftate (TINACTIN) 1 % cream Apply 1 application topically daily as needed (foot fungus).     [provider]  TRUEplus Lancets 33G MISC TEST BLOOD SUGAR TWICE DAILY AS DIRECTED 11/11/21   [name], MD      Allergies    Other, Penicillins, Sulfa antibiotics, Aspirin, and Codeine    Review of Systems   Review of Systems  Constitutional:  Negative for chills and fever.  Respiratory:  Negative for cough and shortness of breath.   Cardiovascular:  Negative for chest pain.  Gastrointestinal:  Negative for abdominal pain, diarrhea and vomiting.  Genitourinary:  Negative for dysuria.  Neurological:  Positive for weakness and numbness. Negative for headaches.  All other systems reviewed and are negative.   Physical Exam Updated Vital Signs BP 129/67   Pulse 63   Wt 86 kg   BMI 35.82 kg/m  Physical Exam Vitals and nursing note reviewed.  Constitutional:      General: She is not in acute distress.  Appearance: She is not toxic-appearing.  HENT:     Head: Normocephalic and atraumatic.     Comments: No raccoon eyes or battle sign. Eyes:     Pupils: Pupils are equal, round, and reactive to light.  Cardiovascular:     Rate and Rhythm: Normal rate and regular rhythm.     Comments: 2+ symmetric radial pulses. Pulmonary:     Effort: Pulmonary effort is normal.     Breath sounds: Normal breath sounds.  Abdominal:     General: There is no distension.     Palpations: Abdomen is soft.     Tenderness: There is no abdominal tenderness. There is no guarding or rebound.  Musculoskeletal:     Cervical back: Neck supple. No rigidity or tenderness.  Skin:    General: Skin is warm and dry.  Neurological:     Mental Status: She is alert.     Comments: Alert.  Clear speech.  Frequent tongue movements, somewhat redirectable, otherwise no tonic-clonic movements.  Left upper and lower extremity weakness compared to the right upper and lower extremity.  No obvious facial droop.     ED Results / Procedures / Treatments   Labs (all labs ordered are listed, but only abnormal results  are displayed) Labs Reviewed  APTT - Abnormal; Notable for the following components:      Result Value   aPTT 39 (*)    All other components within normal limits  CBC - Abnormal; Notable for the following components:   Hemoglobin 9.8 (*)    HCT 32.7 (*)    MCV 75.5 (*)    MCH 22.6 (*)    RDW 17.1 (*)    All other components within normal limits  CBG MONITORING, ED - Abnormal; Notable for the following components:   Glucose-Capillary 201 (*)    All other components within normal limits  I-STAT CHEM 8, ED - Abnormal; Notable for the following components:   Glucose, Bld 187 (*)    Calcium, Ion 1.05 (*)    Hemoglobin 10.9 (*)    HCT 32.0 (*)    All other components within normal limits  RESP PANEL BY RT-PCR (FLU A&B, COVID) ARPGX2  PROTIME-INR  DIFFERENTIAL  ETHANOL  COMPREHENSIVE METABOLIC PANEL  RAPID URINE DRUG SCREEN, HOSP PERFORMED  URINALYSIS, ROUTINE W REFLEX MICROSCOPIC    EKG None  Radiology DG Chest Portable 1 View  Result Date: 12/28/2021 CLINICAL DATA:  Altered mental status EXAM: PORTABLE CHEST 1 VIEW COMPARISON:  04/01/2021 FINDINGS: Cardiomegaly. No acute appearing airspace opacity. Unchanged, minimal scarring or atelectasis of the peripheral left midlung. The visualized skeletal structures are unremarkable. IMPRESSION: Cardiomegaly without acute abnormality of the lungs in AP portable projection. Electronically Signed   By: Delanna Ahmadi M.D.   On: 12/28/2021 15:17   MR BRAIN WO CONTRAST  Result Date: 12/28/2021 CLINICAL DATA:  Neuro deficit, acute, stroke suspected. Left-sided weakness. EXAM: MRI HEAD WITHOUT CONTRAST TECHNIQUE: Multiplanar, multiecho pulse sequences of the brain and surrounding structures were obtained without intravenous contrast. COMPARISON:  Head CT 12/28/2021 and MRI 11/10/2019 FINDINGS: Brain: There is no evidence of an acute infarct, intracranial hemorrhage, mass, midline shift, or extra-axial fluid collection. A chronic right-sided  perirolandic infarct is again noted. T2 hyperintensities elsewhere in the cerebral white matter bilaterally have slightly progressed from the prior MRI and are nonspecific but compatible with mild-to-moderate chronic small vessel ischemic disease. Mild cerebral atrophy is within normal limits for age. A partially empty sella is noted. Vascular: Major  intracranial vascular flow voids are preserved. Skull and upper cervical spine: Unremarkable bone marrow signal. Sinuses/Orbits: Bilateral cataract extraction. Paranasal sinuses and mastoid air cells are clear. Other: None. IMPRESSION: 1. No acute intracranial abnormality. 2. Mild-to-moderate chronic small vessel ischemic disease. 3. Chronic right perirolandic infarct. Electronically Signed   By: Logan Bores M.D.   On: 12/28/2021 13:08   CT ANGIO HEAD NECK W WO CM (CODE STROKE)  Result Date: 12/28/2021 CLINICAL DATA:  Neuro deficit, acute, stroke suspected. EXAM: CT ANGIOGRAPHY HEAD AND NECK TECHNIQUE: Multidetector CT imaging of the head and neck was performed using the standard protocol during bolus administration of intravenous contrast. Multiplanar CT image reconstructions and MIPs were obtained to evaluate the vascular anatomy. Carotid stenosis measurements (when applicable) are obtained utilizing NASCET criteria, using the distal internal carotid diameter as the denominator. RADIATION DOSE REDUCTION: This exam was performed according to the departmental dose-optimization program which includes automated exposure control, adjustment of the mA and/or kV according to patient size and/or use of iterative reconstruction technique. CONTRAST:  53m OMNIPAQUE IOHEXOL 350 MG/ML SOLN COMPARISON:  CTA head and neck 02/24/2019 FINDINGS: CTA NECK FINDINGS Aortic arch: Standard 3 vessel aortic arch with mild atherosclerotic plaque. No significant stenosis of the arch vessel origins. Right carotid system: Patent with a small amount of calcified plaque at the carotid  bifurcation. No evidence of a significant stenosis or dissection. Retropharyngeal course of the proximal ICA. Left carotid system: Patent with minimal calcified plaque at the carotid bifurcation. No evidence of a significant stenosis or dissection. Vertebral arteries: Patent without evidence of a significant stenosis or dissection. Skeleton: Mild cervical spondylosis. Other neck: No evidence of cervical lymphadenopathy or mass. Upper chest: Chronically enlarged pulmonary arteries which can be seen with pulmonary arterial hypertension. Mild centrilobular and paraseptal emphysema. Review of the MIP images confirms the above findings CTA HEAD FINDINGS Anterior circulation: The internal carotid arteries are patent from skull base to carotid termini with mild atherosclerotic plaque not resulting in significant stenosis. ACAs and MCAs are patent without evidence of a proximal branch occlusion or significant proximal stenosis. No aneurysm is identified. Posterior circulation: The intracranial vertebral arteries are patent to the basilar with mild irregular narrowing bilaterally. The left vertebral artery is dominant. Patent AICA and SCA origins are seen bilaterally. The basilar artery is widely patent. There is a small left posterior communicating artery. Both PCAs are patent without evidence of a significant proximal stenosis. No aneurysm is identified. Venous sinuses: As permitted by contrast timing, patent. Anatomic variants: Azygous A2 configuration. Review of the MIP images confirms the above findings IMPRESSION: 1. No emergent large vessel occlusion. 2. Mild atherosclerosis in the head and neck without a flow limiting proximal stenosis. 3. Aortic Atherosclerosis (ICD10-I70.0) and Emphysema (ICD10-J43.9). These results were communicated to Dr. KLorrin Goodellat 12:10 pm on 12/28/2021 by text page via the [facility]aging system. Electronically Signed   By: ALogan BoresM.D.   On: 12/28/2021 12:27   CT HEAD CODE STROKE WO  CONTRAST  Result Date: 12/28/2021 CLINICAL DATA:  Code stroke.  Neuro deficit, acute, stroke suspected EXAM: CT HEAD WITHOUT CONTRAST TECHNIQUE: Contiguous axial images were obtained from the base of the skull through the vertex without intravenous contrast. RADIATION DOSE REDUCTION: This exam was performed according to the departmental dose-optimization program which includes automated exposure control, adjustment of the mA and/or kV according to patient size and/or use of iterative reconstruction technique. COMPARISON:  CT head March 31, 2021. FINDINGS: Brain: No evidence of acute  large vascular territory infarction, hemorrhage, hydrocephalus, extra-axial collection or mass lesion/mass effect. Similar appearance of a remote infarct in the right perirolandic cortex. Partially empty sella. Vascular: No hyperdense vessel identified. Skull: No acute fracture. Sinuses/Orbits: Clear visualized sinuses. No acute orbital findings. Other: No mastoid effusions. ASPECTS [facility] Stroke Program Early CT Score) total score (0-10 with 10 being normal): 10 IMPRESSION: 1. No evidence of acute intracranial abnormality. ASPECTS is 10. 2. Remote right perirolandic infarct. Code stroke imaging results were communicated on 12/28/2021 at 12:02 pm to provider Dr. Lorrin Goodell via secure text paging. Electronically Signed   By: Margaretha Sheffield M.D.   On: 12/28/2021 12:02    Procedures Procedures    Medications Ordered in ED Medications  iohexol (OMNIPAQUE) 350 MG/ML injection 75 mL (75 mLs Intravenous Contrast Given 12/28/21 1200)    ED Course/ Medical Decision Making/ A&P                           Medical Decision Making Amount and/or Complexity of Data Reviewed Labs: ordered. Radiology: ordered.  Risk Prescription drug management.   Patient presents to the ED as a code stroke due to being found unresponsive with subsequent left-sided weakness, this involves an extensive number of treatment options, and is a  complaint that carries with it a high risk of complications and morbidity. Nontoxic, vitals with mild intermittent bradycardia..   Ddx including but not limited to: Ischemic CVA, intracranial hemorrhage, seizure activity, Todd's paralysis, electrolyte disturbance, arrhythmia, infectious process.   Additional history obtained:  Discussion with EMS  Lab Tests:  I viewed & interpreted labs including:  CBG 201 on arrival CBC: Mild anemia similar to prior ranges. CMP: Renal function similar to prior Ethanol level: Negative Urinalysis without UTI UDS: Negative APTT: Mildly elevated PT/INR: Normal  Imaging Studies:  I ordered and viewed the following imaging, agree with radiologist impression:  CT head code stroke:1. No evidence of acute intracranial abnormality. ASPECTS is 10. 2. Remote right perirolandic infarct. CTAs head/neck: 1. No emergent large vessel occlusion. 2. Mild atherosclerosis in the head and neck without a flow limiting proximal stenosis. 3. Aortic Atherosclerosis (ICD10-I70.0) and Emphysema (ICD10-J43.9). MRI brain without contrast: 1. No acute intracranial abnormality. 2. Mild-to-moderate chronic small vessel ischemic disease. 3. Chronic right perirolandic infarct CXR: Cardiomegaly without acute abnormality of the lungs in AP portable projection  Code stroke canceled in the setting of negative MRI.  Discussed with neurologist Dr. Rob Hickman- plan for EEG monitoring to help determine disposition.   15:15: Care signed out to Dr. Oneita Hurt at shift change pending EEG/neurology recommendations.   Portions of this note were generated with [employer]. Dictation errors may occur despite best attempts at proofreading.  Final Clinical Impression(s) / ED Diagnoses Final diagnoses:  None    Rx / DC Orders ED Discharge Orders     None         Amaryllis Dyke, PA-C 12/28/21 1522    Varney Biles, MD 12/31/21 1510

## 2021-12-28 NOTE — Consult Note (Addendum)
Neurology Consultation  Reason for Consult: Code stroke  Referring Physician: Dr. Kathrynn Humble   CC: unresponsive; left facial droop and left weakness   History is obtained from:medical record  HPI: [name] is a 76 y.o. female with past medical history of CHF, CKD, COPD, DM, HTN, HLD, drug induced Parkinson's disease, seizures, OSA on CPAP, GI bleed who presents to [facility] ED for acute onset of unresponsiveness followed by left facial droop and left side weakness. Code stroke was activated by EMS. LKW 1045, per EMS patient lives with daughter, daughter was with patient @ 31 and was in her normal self, daughter left and went into another room and came back and found her unresponsive and staring off, followed by left side weakness and facial droop. Patient has persistent repetitive mouth  and tongue movements. She can stop mouth movements when asked, however continues immediately. CT head no acute abnormality. CTA with no LVO. NIHSS 8. Patient taken to MRI for treatment decision. MRI with no acute stroke. Code stroke cancelled $RemoveBeforeDE'@1235'dyepxUnPaDdEPxq$    LKW: 1045 IV thrombolysis given?: no, no acute stroke on MRI  Premorbid modified Rankin scale (mRS):  4-Needs assistance to walk and tend to bodily needs  ROS: Full ROS was performed and is negative except as noted in the HPI.    Past Medical History:  Diagnosis Date   Abnormal liver function     in the past.   Anemia    Arthritis    all over   Bruises easily    Cataract    right eye;immature   CHF (congestive heart failure) (HCC)    Chronic back pain    stenosis   Chronic cough    Chronic kidney disease    COPD (chronic obstructive pulmonary disease) ([place])    COVID    aug/sept 2021   Demand myocardial infarction [facility]) 2012   Demand Infarction in setting of Pancreatitis --> mild Troponin elevation, NON-OBSTRUCTIVE CAD   Depression    takes Abilify daily as well as Zoloft   Diastolic heart failure 4235   Grade 1 diastolic Dysfunction by Echo     Diverticulosis    DM (diabetes mellitus) ([place])    takes Victoza daily as well as Lantus and Humalog   Empty sella ([place])    on MRI in 2009.   Glaucoma    Headache(784.0)    last migraine-4-97yrs ago   History of blood transfusion    no abnormal reaction noted   History of colon polyps    benign   HTN (hypertension)    takes Benicar,Imdur,and Bystolic daily   Hyperlipidemia    takes Lipitor daily   Joint swelling    Nocturia    Obesity hypoventilation syndrome ([place])    Obstructive sleep apnea 02/2018   Notably improved split-night study with weight loss from 270 (2017) down to 250 pounds (2019).   Pancreatitis    takes Pancrelipase daily   Parkinson's disease ([place])    takes Sinemet daily   Peripheral neuropathy    Pneumonia 2012   Seizures ([place])    takes Lamictal daily and Primidone nightly;last seizure 2wks ago   Urinary urgency    With increased frequency   Varicose veins of both lower extremities with pain    With edema.  Takes daily Lasix     Family History  Problem Relation Age of Onset   Allergies Father    Heart disease Father        before 56   Heart failure  Father    Hypertension Father    Hyperlipidemia Father    Heart disease Mother    Diabetes Mother    Hypertension Mother    Hyperlipidemia Mother    Hypertension Sister    Heart disease Sister        before 2   Hyperlipidemia Sister    Diabetes Brother    Hypertension Brother    Diabetes Sister    Hypertension Sister    Diabetes Son    Hypertension Son    Cancer Other      Social History:   reports that she has quit smoking. Her smoking use included cigarettes. She has a 12.50 pack-year smoking history. She has never used smokeless tobacco. She reports that she does not drink alcohol and does not use drugs.  Medications No current facility-administered medications for this encounter.  Current Outpatient Medications:    Accu-Chek Softclix Lancets lancets, Use as instructed to check blood  sugars twice daily e11.69, Disp: 100 each, Rfl: 2   Accu-Chek Softclix Lancets lancets, Use as instructed to check blood sugars three times daily E11.69, Disp: 100 each, Rfl: 12   acetaminophen (TYLENOL) 500 MG tablet, Take 500-1,000 mg by mouth every 4 (four) hours as needed for moderate pain. , Disp: , Rfl:    albuterol (VENTOLIN HFA) 108 (90 Base) MCG/ACT inhaler, Inhale 1-2 puffs into the lungs every 6 (six) hours as needed for wheezing or shortness of breath., Disp: 18 g, Rfl: 5   Alcohol Swabs (DROPSAFE ALCOHOL PREP) 70 % PADS, USE TWO TIMES DAILY AS DIRECTED, Disp: 200 each, Rfl: 6   Ascorbic Acid (VITAMIN C) 1000 MG tablet, Take 1,000 mg by mouth daily., Disp: , Rfl:    atorvastatin (LIPITOR) 10 MG tablet, TAKE 1 TABLET EVERY EVENING., Disp: 90 tablet, Rfl: 2   B-D ULTRAFINE III SHORT PEN 31G X 8 MM MISC, USE DAILY WITH VICTOZA AND/OR NOVOLOG., Disp: 400 each, Rfl: 2   Blood Glucose Calibration (TRUE METRIX LEVEL 1) Low SOLN, Use as directed  Dx:e11.22, Disp: 1 each, Rfl: 2   Blood Glucose Monitoring Suppl (TRUE METRIX METER) w/Device KIT, USE AS DIRECTED, Disp: 1 kit, Rfl: 1   Budeson-Glycopyrrol-Formoterol (BREZTRI AEROSPHERE) 160-9-4.8 MCG/ACT AERO, Inhale 2 puffs into the lungs in the morning and at bedtime., Disp: 10.7 g, Rfl: 2   carvedilol (COREG) 12.5 MG tablet, Take 1 tablet (12.5 mg total) by mouth 2 (two) times daily., Disp: 180 tablet, Rfl: 3   dexlansoprazole (DEXILANT) 60 MG capsule, Take 1 capsule (60 mg total) by mouth daily., Disp: 90 capsule, Rfl: 2   diclofenac sodium (VOLTAREN) 1 % GEL, Apply 2 g topically 4 (four) times daily as needed (pain)., Disp: , Rfl:    ferrous sulfate 324 MG TBEC, Patient takes 1 tablet by mouth every other day., Disp: , Rfl:    furosemide (LASIX) 20 MG tablet, Take 4 tablets (80 mg total) by mouth in the morning AND 3 tablets (60 mg total) every evening., Disp: 180 tablet, Rfl: 3   Glucagon (GVOKE HYPOPEN 1-PACK) 0.5 MG/0.1ML SOAJ, Inject 0.5 mg  into the skin daily as needed., Disp: 0.1 mL, Rfl: 11   glucose blood (TRUE METRIX BLOOD GLUCOSE TEST) test strip, USE AS DIRECTED TO CHECK BLOOD SUGARS 2 TIMES PER DAY, Disp: 200 strip, Rfl: 2   guaiFENesin-dextromethorphan (ROBITUSSIN DM) 100-10 MG/5ML syrup, Take 10 mLs by mouth every 6 (six) hours as needed for cough., Disp: 118 mL, Rfl: 0   hydrALAZINE (APRESOLINE) 100  MG tablet, Take 1 tablet (100 mg total) by mouth 3 (three) times daily., Disp: 270 tablet, Rfl: 3   hydrocortisone cream 1 %, Apply 1 application topically daily as needed for itching. , Disp: , Rfl:    insulin glargine, 1 Unit Dial, (TOUJEO SOLOSTAR) 300 UNIT/ML Solostar Pen, Inject 25 Units into the skin daily., Disp: 4.5 mL, Rfl: 1   insulin lispro (HUMALOG KWIKPEN) 100 UNIT/ML KwikPen, Inject 0.02-0.04 mLs (2-4 Units total) into the skin 3 (three) times daily as needed (high blood sugar over 150)., Disp: 15 mL, Rfl: 2   Isopropyl Alcohol (ALCOHOL WIPES) 70 % MISC, Apply 1 each topically 2 (two) times daily., Disp: 300 each, Rfl: 3   isosorbide mononitrate (IMDUR) 30 MG 24 hr tablet, TAKE 1 TABLET EVERY DAY (APPOINTMENT IS NEEDED FOR REFILLS), Disp: 90 tablet, Rfl: 2   lamoTRIgine (LAMICTAL) 150 MG tablet, Take 1 tablet (150 mg total) by mouth 2 (two) times daily., Disp: 180 tablet, Rfl: 3   levETIRAcetam (KEPPRA) 500 MG tablet, Take 1,000 mg by mouth at bedtime., Disp: , Rfl:    ondansetron (ZOFRAN) 4 MG tablet, Take 1 tablet (4 mg total) by mouth every 8 (eight) hours as needed for nausea or vomiting., Disp: 20 tablet, Rfl: 1   OZEMPIC, 1 MG/DOSE, 4 MG/3ML SOPN, INJECT 1 MG INTO THE SKIN ONCE A WEEK., Disp: 9 mL, Rfl: 3   Pancrelipase, Lip-Prot-Amyl, 25000-79000 units CPEP, Take 1-2 capsules by mouth See admin instructions. Take 1 capsules in the morning with breakfast, 1 capsule with lunch, and 2 capsules with supper, Disp: , Rfl:    potassium chloride SA (KLOR-CON M) 20 MEQ tablet, Take 2 tablets (40 mEq total) by mouth in  the morning AND 1 tablet (20 mEq total) every evening. With additional 60 on Metolazone days., Disp: 300 tablet, Rfl: 2   sennosides-docusate sodium (SENOKOT-S) 8.6-50 MG tablet, Take 1-2 tablets by mouth at bedtime as needed for constipation., Disp: , Rfl:    sertraline (ZOLOFT) 50 MG tablet, TAKE 1 TABLET EVERY DAY, Disp: 90 tablet, Rfl: 2   tolnaftate (TINACTIN) 1 % cream, Apply 1 application topically daily as needed (foot fungus). , Disp: , Rfl:    TRUEplus Lancets 33G MISC, TEST BLOOD SUGAR TWICE DAILY AS DIRECTED, Disp: 200 each, Rfl: 3   Exam: Current vital signs: Wt 86 kg   BMI 35.82 kg/m  Vital signs in last 24 hours: Weight:  [86 kg] 86 kg (07/06 1100)  GENERAL: Awake, alert in NAD HEENT: - Normocephalic and atraumatic, dry mm, missing teeth LUNGS - Clear to auscultation bilaterally with no wheezes CV - S1S2 RRR, no m/r/g, equal pulses bilaterally. ABDOMEN - Soft, nontender, nondistended with normoactive BS Ext: warm, well perfused, intact peripheral pulses, no edema  NEURO:  Mental Status: AA&Ox3  Language: speech is clear.  Naming, repetition, fluency, and comprehension intact. Cranial Nerves: PERRL, EOMI, visual fields full, no facial asymmetry, facial sensation intact, hearing intact, tongue/uvula/soft palate midline, normal sternocleidomastoid and trapezius muscle strength. No evidence of tongue atrophy or fibrillations Motor: left arm and leg 4/5, Right arm and leg 5/5 Tone: is normal and bulk is normal Sensation- decreased on left  Coordination: FTN intact bilaterally, no ataxia in BLE. Gait- deferred  NIHSS 1a Level of Conscious.: 0 1b LOC Questions: 0 1c LOC Commands: 0 2 Best Gaze: 0 3 Visual: 1 4 Facial Palsy: 1 5a Motor Arm - left: 2 5b Motor Arm - Right: 0 6a Motor Leg - Left: 2  6b Motor Leg - Right: 0 7 Limb Ataxia: 0 8 Sensory: 1 9 Best Language: 0 10 Dysarthria: 1 11 Extinct. and Inatten.: 0 TOTAL: 8   Labs I have reviewed labs in epic  and the results pertinent to this consultation are:   Imaging I have reviewed the images obtained:  Code stroke CT-head No evidence of acute intracranial abnormality. ASPECTS is 10. Remote right perirolandic infarct  Code stroke CTA head/neck  1. No emergent large vessel occlusion. 2. Mild atherosclerosis in the head and neck without a flow limiting proximal stenosis. 3. Aortic Atherosclerosis (ICD10-I70.0) and Emphysema (ICD10-J43.9).   MRI examination of the brain With no acute stroke viewed by Dr Lorrin Goodell. Official read pending   Assessment:  [name] is a 76 y.o. female with past medical history of CHF, CKD, COPD, DM, HTN, HLD, drug induced Parkinson's disease, seizures, OSA on CPAP, GI bleed who presents to [facility] ED for acute onset of unresponsiveness followed by left facial droop and left side weakness. Code stroke was activated by EMS  Impression: Seizures vs TIA   Recommendations: - Check rEEG to to r/o seizures  - Check UA, CXR and UDS - Please see attending attestation below.  Beulah Gandy DNP, ACNPC-AG   NEUROHOSPITALIST ADDENDUM Performed a face to face diagnostic evaluation.   I have reviewed the contents of history and physical exam as documented by PA/ARNP/Resident and agree with above documentation.  I have discussed and formulated the above plan as documented. Edits to the note have been made as needed.  Impression/Key exam findings/Plan: weird presentation but some concern for functional weakness in her LUE and LLE. Arm falls to the side when held above her head and labelle indifference. Did consider possibility of stroke but given concern for functional etiology and prior history of significant GI bleeding requiring transfusion and hospitalization from just aspirin, we opted to get MRI brain to clarify the situation rather than directly administer TNKase.  Her MRI brain was negative for an acute stroke while she was experiencing symptoms.   TNKase/thrombectomy was thus not offered.  CTA is negative for any significant stenosis or LVO and thus unlikely for this to be carotid insufficiency.  Given her hx of seizures and pseudoseizures, will get routine EEG but I do not see any twitching/jerking of the LUE and LLE. Looking through past records, EEGs have either been normal or shown left sided epileptiform discharges which I would expect to cause right-sided symptoms if it was a focal seizure.  At this time, my suspicion for seizures and stroke is low.  I would recommend continuing her home dose of antiepileptics.  She can follow-up with her outpatient neurologist Dr. Delice Lesch.  No further recommendations at this time.  Neurology inpatient team will sign off.   Donnetta Simpers, MD [facility] [phone]   If 7pm to 7am, please call on call as listed on AMION.

## 2021-12-28 NOTE — ED Notes (Signed)
EEG at bedside.

## 2021-12-28 NOTE — Procedures (Signed)
Patient Name: [name]  MRN: [id number]  Epilepsy Attending: Lora Havens  Referring Physician/Provider: Donnetta Simpers, MD  Date: 12/28/2021 Duration: 28.18 mins  Patient history: 76 y.o. female with seizures presents to [facility] ED for acute onset of unresponsiveness followed by left facial droop and left side weakness. EEG to evaluate for seizure.   Level of alertness: Awake, asleep  AEDs during EEG study: None  Technical aspects: This EEG study was done with scalp electrodes positioned according to the 10-20 International system of electrode placement. Electrical activity was acquired at a sampling rate of '500Hz'$  and reviewed with a high frequency filter of '70Hz'$  and a low frequency filter of '1Hz'$ . EEG data were recorded continuously and digitally stored.   Description: The posterior dominant rhythm consists of 9 Hz activity of moderate voltage (25-35 uV) seen predominantly in posterior head regions, symmetric and reactive to eye opening and eye closing. Sleep was characterized by vertex waves, sleep spindles (12 to 14 Hz), maximal frontocentral region. Hyperventilation and photic stimulation were not performed.     IMPRESSION: This study is within normal limits. No seizures or epileptiform discharges were seen throughout the recording.  Domenico Achord Barbra Sarks

## 2021-12-28 NOTE — ED Triage Notes (Signed)
Pt BIB [facility] from home as code stroke. LKN 1045 & family found her supine on bed unresponsive. When EMS arrived pt was more alert with left sided facial droop, Left hand grip weak & unsteady gate when they stood her to get on their stretcher. 18g PIV in Rt AC, GCS 14 upon arrival to ED & she was continuously licking her lips with repetitive tongue movement.

## 2021-12-28 NOTE — Progress Notes (Signed)
EEG complete - results pending 

## 2021-12-28 NOTE — Code Documentation (Addendum)
Stroke Response Nurse Documentation Code Documentation  [name] is a 76 y.o. female arriving to [facility]  via [facility] on 12/28/21 with past medical hx of anemia, MI, COPD, CHF, CAD,DM, Migraine, seizures. On No antithrombotic. Code stroke was activated by EMS.   Patient from home where she was LKW at 1045 and now complaining of left sided weakness, facial droop, left sensory changes. Pt was LKW by daughter at 46 and when daughter came back in room at 1050 pt was staring off and not responding. Daughter called 911. EMS noted left sided weakness and facial droop on exam. Pt did not have a witnessed seizure and was not incontinent. EMS activated the code stroke.   Stroke team at the bedside on patient arrival. Labs drawn and patient cleared for CT by Dr. Kathrynn Humble and EDP PA. Patient to CT with team. NIHSS 8, see documentation for details and code stroke times. Patient with left hemianopia, left facial droop, left arm weakness, left leg weakness, left decreased sensation, and dysarthria  on exam. The following imaging was completed:  CT Head, CTA, and MRI. Patient is not a candidate for IV Thrombolytic due to no stroke seen on MRI per MD. Patient is not a candidate for IR due to no LVO per MD.   "CT IMPRESSION: 1. No evidence of acute intracranial abnormality. ASPECTS is 10. 2. Remote right perirolandic infarct."  Care Plan: cancel code stroke, EEG   Bedside handoff with ED RN Page.    Larosa Rhines, Rande Brunt  Stroke Response RN

## 2021-12-28 NOTE — Discharge Instructions (Signed)
You were seen in the emergency department for evaluation of concerns over a possible stroke.  Your work-up here today was very reassuring.  Neurology came to evaluate you at bedside and performed an EEG to look for any possible seizure-like activity and this resulted as negative.  We recommend following closely with your neurologist in the outpatient setting.  We also recommend following up with your primary care provider in the coming days for symptom recheck.  As we discussed should you have any additional episodes of one-sided weakness or any other concerning symptoms you may return to the ED for additional evaluation as needed.

## 2021-12-29 LAB — LEVETIRACETAM LEVEL: Levetiracetam Lvl: 17.1 ug/mL (ref 10.0–40.0)

## 2021-12-31 ENCOUNTER — Other Ambulatory Visit: Payer: Self-pay | Admitting: Cardiology

## 2022-01-22 DIAGNOSIS — E1122 Type 2 diabetes mellitus with diabetic chronic kidney disease: Secondary | ICD-10-CM | POA: Diagnosis not present

## 2022-01-22 DIAGNOSIS — N182 Chronic kidney disease, stage 2 (mild): Secondary | ICD-10-CM

## 2022-01-22 DIAGNOSIS — I25118 Atherosclerotic heart disease of native coronary artery with other forms of angina pectoris: Secondary | ICD-10-CM | POA: Diagnosis not present

## 2022-01-22 DIAGNOSIS — Z794 Long term (current) use of insulin: Secondary | ICD-10-CM

## 2022-01-24 DIAGNOSIS — I272 Pulmonary hypertension, unspecified: Secondary | ICD-10-CM | POA: Diagnosis not present

## 2022-01-24 DIAGNOSIS — I50810 Right heart failure, unspecified: Secondary | ICD-10-CM | POA: Diagnosis not present

## 2022-01-24 DIAGNOSIS — E785 Hyperlipidemia, unspecified: Secondary | ICD-10-CM | POA: Diagnosis not present

## 2022-01-24 DIAGNOSIS — I5032 Chronic diastolic (congestive) heart failure: Secondary | ICD-10-CM | POA: Diagnosis not present

## 2022-01-24 DIAGNOSIS — F32A Depression, unspecified: Secondary | ICD-10-CM | POA: Diagnosis not present

## 2022-01-24 DIAGNOSIS — E119 Type 2 diabetes mellitus without complications: Secondary | ICD-10-CM | POA: Diagnosis not present

## 2022-01-24 DIAGNOSIS — J439 Emphysema, unspecified: Secondary | ICD-10-CM | POA: Diagnosis not present

## 2022-01-24 DIAGNOSIS — G40909 Epilepsy, unspecified, not intractable, without status epilepticus: Secondary | ICD-10-CM | POA: Diagnosis not present

## 2022-01-24 DIAGNOSIS — I110 Hypertensive heart disease with heart failure: Secondary | ICD-10-CM | POA: Diagnosis not present

## 2022-02-08 ENCOUNTER — Ambulatory Visit: Payer: Medicare HMO | Admitting: Internal Medicine

## 2022-02-28 ENCOUNTER — Other Ambulatory Visit (HOSPITAL_COMMUNITY): Payer: Self-pay | Admitting: Family Medicine

## 2022-02-28 ENCOUNTER — Other Ambulatory Visit: Payer: Self-pay | Admitting: Internal Medicine

## 2022-03-15 ENCOUNTER — Encounter: Payer: Self-pay | Admitting: Internal Medicine

## 2022-03-15 ENCOUNTER — Ambulatory Visit (INDEPENDENT_AMBULATORY_CARE_PROVIDER_SITE_OTHER): Payer: Medicare HMO | Admitting: Internal Medicine

## 2022-03-15 VITALS — BP 132/70 | HR 67 | Temp 98.3°F | Ht 61.0 in | Wt 185.8 lb

## 2022-03-15 DIAGNOSIS — R051 Acute cough: Secondary | ICD-10-CM | POA: Diagnosis not present

## 2022-03-15 DIAGNOSIS — Z794 Long term (current) use of insulin: Secondary | ICD-10-CM

## 2022-03-15 DIAGNOSIS — Z6835 Body mass index (BMI) 35.0-35.9, adult: Secondary | ICD-10-CM

## 2022-03-15 DIAGNOSIS — E6609 Other obesity due to excess calories: Secondary | ICD-10-CM

## 2022-03-15 DIAGNOSIS — G2581 Restless legs syndrome: Secondary | ICD-10-CM | POA: Diagnosis not present

## 2022-03-15 DIAGNOSIS — I130 Hypertensive heart and chronic kidney disease with heart failure and stage 1 through stage 4 chronic kidney disease, or unspecified chronic kidney disease: Secondary | ICD-10-CM | POA: Diagnosis not present

## 2022-03-15 DIAGNOSIS — E1122 Type 2 diabetes mellitus with diabetic chronic kidney disease: Secondary | ICD-10-CM

## 2022-03-15 DIAGNOSIS — Z23 Encounter for immunization: Secondary | ICD-10-CM

## 2022-03-15 DIAGNOSIS — I110 Hypertensive heart disease with heart failure: Secondary | ICD-10-CM

## 2022-03-15 DIAGNOSIS — I5032 Chronic diastolic (congestive) heart failure: Secondary | ICD-10-CM | POA: Diagnosis not present

## 2022-03-15 DIAGNOSIS — N182 Chronic kidney disease, stage 2 (mild): Secondary | ICD-10-CM | POA: Diagnosis not present

## 2022-03-15 DIAGNOSIS — J8410 Pulmonary fibrosis, unspecified: Secondary | ICD-10-CM

## 2022-03-15 NOTE — Progress Notes (Signed)
Rich Brave [place]ittleton,acting as a Education administrator for [name] Greenland, MD.,have documented all relevant documentation on the behalf of [name] Greenland, MD,as directed by  [name] Greenland, MD while in the presence of [name] Greenland, MD.    Subjective:     Patient ID: [name] , female    DOB: Feb 11, 1946 , 76 y.o.   MRN: [id number]   Chief Complaint  Patient presents with   Diabetes   Hypertension    HPI  She is here today for a diabetes and BP check. She reports compliance with meds. She reports when she is sleeping her legs are constantly moving. She is not sure what is causing her sx. This has been going on for several weeks.   Diabetes She presents for her follow-up diabetic visit. She has type 2 diabetes mellitus. Her disease course has been improving. There are no hypoglycemic associated symptoms. Pertinent negatives for diabetes include no blurred vision, no chest pain, no polydipsia, no polyphagia and no polyuria. There are no hypoglycemic complications. Diabetic complications include nephropathy. Risk factors for coronary artery disease include diabetes mellitus, dyslipidemia, hypertension, obesity, post-menopausal and sedentary lifestyle. She is compliant with treatment some of the time. Her weight is fluctuating minimally. She is following a generally healthy diet. She never participates in exercise. Her home blood glucose trend is decreasing steadily. Her breakfast blood glucose is taken between 8-9 am. Her breakfast blood glucose range is generally 130-140 mg/dl. An ACE inhibitor/angiotensin II receptor blocker is being taken. Eye exam is not current.  Hypertension This is a chronic problem. The current episode started more than 1 year ago. The problem has been gradually improving since onset. The problem is controlled. Pertinent negatives include no blurred vision, chest pain, palpitations or shortness of breath. The current treatment provides moderate improvement. Hypertensive  end-organ damage includes kidney disease. Identifiable causes of hypertension include sleep apnea.     Past Medical History:  Diagnosis Date   Abnormal liver function     in the past.   Anemia    Arthritis    all over   Bruises easily    Cataract    right eye;immature   CHF (congestive heart failure) (HCC)    Chronic back pain    stenosis   Chronic cough    Chronic kidney disease    COPD (chronic obstructive pulmonary disease) ([place])    COVID    aug/sept 2021   Demand myocardial infarction [facility]) 2012   Demand Infarction in setting of Pancreatitis --> mild Troponin elevation, NON-OBSTRUCTIVE CAD   Depression    takes Abilify daily as well as Zoloft   Diastolic heart failure 4166   Grade 1 diastolic Dysfunction by Echo    Diverticulosis    DM (diabetes mellitus) ([place])    takes Victoza daily as well as Lantus and Humalog   Empty sella ([place])    on MRI in 2009.   Glaucoma    Headache(784.0)    last migraine-4-30yr ago   History of blood transfusion    no abnormal reaction noted   History of colon polyps    benign   HTN (hypertension)    takes Benicar,Imdur,and Bystolic daily   Hyperlipidemia    takes Lipitor daily   Joint swelling    Nocturia    Ob[place]ilation [place]    Obstructive sleep apnea 02/2018   Notably improved split-night study with weight loss from 270 (2017) down to 250 pounds (2019).   Pancreatitis  takes Pancrelipase daily   Parkinson's disease ([place])    takes Sinemet daily   Peripheral neuropathy    Pneumonia 2012   Seizures ([place])    takes Lamictal daily and Primidone nightly;last seizure 2wks ago   Urinary urgency    With increased frequency   Varicose veins of both lower extremities with pain    With edema.  Takes daily Lasix     Family History  Problem Relation Age of Onset   Allergies Father    Heart disease Father        before 2   Heart failure Father    Hypertension Father    Hyperlipidemia Father    Heart  disease Mother    Diabetes Mother    Hypertension Mother    Hyperlipidemia Mother    Hypertension Sister    Heart disease Sister        before 1   Hyperlipidemia Sister    Diabetes Brother    Hypertension Brother    Diabetes Sister    Hypertension Sister    Diabetes Son    Hypertension Son    Cancer Other      Current Outpatient Medications:    acetaminophen (TYLENOL) 500 MG tablet, Take 500-1,000 mg by mouth every 4 (four) hours as needed for moderate pain. , Disp: , Rfl:    albuterol (VENTOLIN HFA) 108 (90 Base) MCG/ACT inhaler, Inhale 1-2 puffs into the lungs every 6 (six) hours as needed for wheezing or shortness of breath., Disp: 18 g, Rfl: 5   Alcohol Swabs (DROPSAFE ALCOHOL PREP) 70 % PADS, USE TWO TIMES DAILY AS DIRECTED, Disp: 200 each, Rfl: 6   Ascorbic Acid (VITAMIN C) 1000 MG tablet, Take 1,000 mg by mouth daily., Disp: , Rfl:    atorvastatin (LIPITOR) 10 MG tablet, TAKE 1 TABLET EVERY EVENING, Disp: 90 tablet, Rfl: 2   B-D ULTRAFINE III SHORT PEN 31G X 8 MM MISC, USE DAILY WITH VICTOZA AND/OR NOVOLOG., Disp: 400 each, Rfl: 2   benzonatate (TESSALON) 100 MG capsule, Take 100 mg by mouth 3 (three) times daily as needed for cough., Disp: , Rfl:    Blood Glucose Calibration (TRUE METRIX LEVEL 1) Low SOLN, Use as directed  Dx:[id number], Disp: 1 each, Rfl: 2   Blood Glucose Monitoring Suppl (TRUE METRIX METER) w/Device KIT, USE AS DIRECTED, Disp: 1 kit, Rfl: 1   Budeson-Glycopyrrol-Formoterol (BREZTRI AEROSPHERE) 160-9-4.8 MCG/ACT AERO, Inhale 2 puffs into the lungs in the morning and at bedtime. (Patient not taking: Reported on 03/20/2022), Disp: 10.7 g, Rfl: 2   carvedilol (COREG) 12.5 MG tablet, Take 1 tablet (12.5 mg total) by mouth 2 (two) times daily., Disp: 180 tablet, Rfl: 3   dexlansoprazole (DEXILANT) 60 MG capsule, Take 1 capsule (60 mg total) by mouth daily., Disp: 90 capsule, Rfl: 2   diclofenac sodium (VOLTAREN) 1 % GEL, Apply 2 g topically 4 (four) times daily as  needed (pain)., Disp: , Rfl:    ferrous sulfate 324 MG TBEC, Patient takes 1 tablet by mouth every other day., Disp: , Rfl:    furosemide (LASIX) 20 MG tablet, TAKE 4 TABLETS (80 MG) BY MOUTH IN THE MORNING AND 3 TABLETS (60 MG) EVERY EVENING., Disp: 210 tablet, Rfl: 6   Glucagon (GVOKE HYPOPEN 1-PACK) 0.5 MG/0.1ML SOAJ, Inject 0.5 mg into the skin daily as needed., Disp: 0.1 mL, Rfl: 11   glucose blood (TRUE METRIX BLOOD GLUCOSE TEST) test strip, USE AS DIRECTED TO CHECK BLOOD SUGARS 2 TIMES PER  DAY, Disp: 200 strip, Rfl: 2   hydrALAZINE (APRESOLINE) 100 MG tablet, Take 1 tablet (100 mg total) by mouth 3 (three) times daily., Disp: 270 tablet, Rfl: 3   hydrocortisone cream 1 %, Apply 1 application topically daily as needed for itching. , Disp: , Rfl:    insulin glargine, 1 Unit Dial, (TOUJEO SOLOSTAR) 300 UNIT/ML Solostar Pen, Inject 25 Units into the skin daily., Disp: 4.5 mL, Rfl: 1   insulin lispro (HUMALOG KWIKPEN) 100 UNIT/ML KwikPen, Inject 0.02-0.04 mLs (2-4 Units total) into the skin 3 (three) times daily as needed (high blood sugar over 150)., Disp: 15 mL, Rfl: 2   Isopropyl Alcohol (ALCOHOL WIPES) 70 % MISC, Apply 1 each topically 2 (two) times daily., Disp: 300 each, Rfl: 3   isosorbide mononitrate (IMDUR) 30 MG 24 hr tablet, TAKE 1 TABLET EVERY DAY, Disp: 90 tablet, Rfl: 2   lamoTRIgine (LAMICTAL) 150 MG tablet, Take 1 tablet (150 mg total) by mouth 2 (two) times daily., Disp: 180 tablet, Rfl: 3   levETIRAcetam (KEPPRA) 500 MG tablet, Take 1,000 mg by mouth at bedtime., Disp: , Rfl:    ondansetron (ZOFRAN) 4 MG tablet, Take 1 tablet (4 mg total) by mouth every 8 (eight) hours as needed for nausea or vomiting., Disp: 20 tablet, Rfl: 1   OZEMPIC, 1 MG/DOSE, 4 MG/3ML SOPN, INJECT 1 MG INTO THE SKIN ONCE A WEEK., Disp: 9 mL, Rfl: 3   Pancrelipase, Lip-Prot-Amyl, 25000-79000 units CPEP, Take 1-2 capsules by mouth See admin instructions. Take 1 capsules in the morning with breakfast, 1 capsule  with lunch, and 2 capsules with supper, Disp: , Rfl:    potassium chloride SA (KLOR-CON M) 20 MEQ tablet, Take 2 tablets (40 mEq total) by mouth in the morning AND 1 tablet (20 mEq total) every evening. With additional 60 on Metolazone days., Disp: 300 tablet, Rfl: 2   sennosides-docusate sodium (SENOKOT-S) 8.6-50 MG tablet, Take 1-2 tablets by mouth at bedtime as needed for constipation., Disp: , Rfl:    sertraline (ZOLOFT) 50 MG tablet, TAKE 1 TABLET EVERY DAY, Disp: 90 tablet, Rfl: 2   tolnaftate (TINACTIN) 1 % cream, Apply 1 application topically daily as needed (foot fungus). , Disp: , Rfl:    TRUEplus Lancets 33G MISC, TEST BLOOD SUGAR TWICE DAILY AS DIRECTED, Disp: 200 each, Rfl: 3   guaiFENesin-dextromethorphan (ROBITUSSIN DM) 100-10 MG/5ML syrup, Take 10 mLs by mouth every 6 (six) hours as needed for cough. (Patient not taking: Reported on 03/15/2022), Disp: 118 mL, Rfl: 0   Allergies  Allergen Reactions   Other Anaphylaxis and Rash    Bleach   Penicillins Hives    Did it involve swelling of the face/tongue/throat, SOB, or low BP? No Did it involve sudden or severe rash/hives, skin peeling, or any reaction on the inside of your mouth or nose? Yes Did you need to seek medical attention at a hospital or doctor's office? Yes When did it last happen?      Childhood allergy  If all above answers are "NO", may proceed with cephalosporin use.     Sulfa Antibiotics Hives   Aspirin Other (See Comments)     On aspirin 81 mg - Rectal bleeding in dec 2018   Codeine     Headache and makes the patient feel "off"     Review of Systems  Constitutional: Negative.   Eyes: Negative.  Negative for blurred vision.  Respiratory:  Positive for cough. Negative for shortness of breath.  She c/o dry cough. Started several weeks ago. Denies fever/chills. No ill contacts, headaches, body aches. She has to clear her throat upon awakening.   Cardiovascular: Negative.  Negative for chest pain and  palpitations.  Endocrine: Negative for polydipsia, polyphagia and polyuria.  Neurological: Negative.   Psychiatric/Behavioral: Negative.       Today's Vitals   03/15/22 1555  BP: 132/70  Pulse: 67  Temp: 98.3 F (36.8 C)  Weight: 185 lb 12.8 oz (84.3 kg)  Height: _0  (1.549 m)   Body mass index is 35.11 kg/m.  Wt Readings from Last 3 Encounters:  03/20/22 187 lb (84.8 kg)  03/15/22 185 lb 12.8 oz (84.3 kg)  12/28/21 189 lb 9.5 oz (86 kg)     Objective:  Physical Exam Vitals and nursing note reviewed.  Constitutional:      Appearance: Normal appearance. She is obese.  HENT:     Head: Normocephalic and atraumatic.  Eyes:     Extraocular Movements: Extraocular movements intact.  Cardiovascular:     Rate and Rhythm: Normal rate and regular rhythm.     Heart sounds: Normal heart sounds.  Pulmonary:     Effort: Pulmonary effort is normal.     Breath sounds: Normal breath sounds.  Musculoskeletal:     Cervical back: Normal range of motion.  Skin:    General: Skin is warm.  Neurological:     General: No focal deficit present.     Mental Status: She is alert.  Psychiatric:        Mood and Affect: Mood normal.        Behavior: Behavior normal.      Assessment And Plan:     1. Type 2 diabetes mellitus with stage 2 chronic kidney disease, with long-term current use of insulin ([place]) Comments: Chronic, she will c/w semaglutide weekly and Toujeo. I will check labs as below. She will rto in 4 months for re-evaluation. - Hemoglobin A1c - CBC no Diff - BMP8+eGFR  2. Hypertensive heart disease with chronic diastolic congestive heart failure (HCC) Comments: Chronic, fair control. Gaol BP<130/80. She is encouraged to follow low sodium diet.  She will c/w carvedilol bid, hydralazine tid and furosemide qd.   3. Restless legs Comments: I will check CBC today, RLS can be associated with anemia.  I will consider treatment if sx persist.  - CBC no Diff  4. Acute  cough Comments: Sx are suggestive of postnasal drip. She will c/w Tessalon perles prn. She may benefit from loratadine if her sx persist.   5. Class 2 severe obesity due to excess calories with serious comorbidity and body mass index (BMI) of 35.0 to 35.9 in adult [facility]) Comments: She is encouraged to aim for at least 150 minutes of exercise per week, while striving for BMI<30 to decrease cardiac risk.   6. Immunization due - Flu Vaccine QUAD High Dose(Fluad)   Patient was given opportunity to ask questions. Patient verbalized understanding of the plan and was able to repeat key elements of the plan. All questions were answered to their satisfaction.   I, [name] Greenland, MD, have reviewed all documentation for this visit. The documentation on 03/31/22 for the exam, diagnosis, procedures, and orders are all accurate and complete.   IF YOU HAVE BEEN REFERRED TO A SPECIALIST, IT MAY TAKE 1-2 WEEKS TO SCHEDULE/PROCESS THE REFERRAL. IF YOU HAVE NOT HEARD FROM US/SPECIALIST IN TWO WEEKS, PLEASE GIVE [place] A CALL AT [phone].   THE  PATIENT IS ENCOURAGED TO PRACTICE SOCIAL DISTANCING DUE TO THE COVID-19 PANDEMIC.   

## 2022-03-15 NOTE — Patient Instructions (Addendum)
DECREASE TOUJEO TO 20 UNITS  Diabetes Mellitus and Nutrition, Adult When you have diabetes, or diabetes mellitus, it is very important to have healthy eating habits because your blood sugar (glucose) levels are greatly affected by what you eat and drink. Eating healthy foods in the right amounts, at about the same times every day, can help you: Manage your blood glucose. Lower your risk of heart disease. Improve your blood pressure. Reach or maintain a healthy weight. What can affect my meal plan? Every person with diabetes is different, and each person has different needs for a meal plan. Your health care provider may recommend that you work with a dietitian to make a meal plan that is best for you. Your meal plan may vary depending on factors such as: The calories you need. The medicines you take. Your weight. Your blood glucose, blood pressure, and cholesterol levels. Your activity level. Other health conditions you have, such as heart or kidney disease. How do carbohydrates affect me? Carbohydrates, also called carbs, affect your blood glucose level more than any other type of food. Eating carbs raises the amount of glucose in your blood. It is important to know how many carbs you can safely have in each meal. This is different for every person. Your dietitian can help you calculate how many carbs you should have at each meal and for each snack. How does alcohol affect me? Alcohol can cause a decrease in blood glucose (hypoglycemia), especially if you use insulin or take certain diabetes medicines by mouth. Hypoglycemia can be a life-threatening condition. Symptoms of hypoglycemia, such as sleepiness, dizziness, and confusion, are similar to symptoms of having too much alcohol. Do not drink alcohol if: Your health care provider tells you not to drink. You are pregnant, may be pregnant, or are planning to become pregnant. If you drink alcohol: Limit how much you have to: 0-1 drink a day  for women. 0-2 drinks a day for men. Know how much alcohol is in your drink. In the [place] one drink equals one 12 oz bottle of beer (355 mL), one 5 oz glass of wine (148 mL), or one 1 oz glass of hard liquor (44 mL). Keep yourself hydrated with water, diet soda, or unsweetened iced tea. Keep in mind that regular soda, juice, and other mixers may contain a lot of sugar and must be counted as carbs. What are tips for following this plan?  Reading food labels Start by checking the serving size on the Nutrition Facts label of packaged foods and drinks. The number of calories and the amount of carbs, fats, and other nutrients listed on the label are based on one serving of the item. Many items contain more than one serving per package. Check the total grams (g) of carbs in one serving. Check the number of grams of saturated fats and trans fats in one serving. Choose foods that have a low amount or none of these fats. Check the number of milligrams (mg) of salt (sodium) in one serving. Most people should limit total sodium intake to less than 2,300 mg per day. Always check the nutrition information of foods labeled as "low-fat" or "nonfat." These foods may be higher in added sugar or refined carbs and should be avoided. Talk to your dietitian to identify your daily goals for nutrients listed on the label. Shopping Avoid buying canned, pre-made, or processed foods. These foods tend to be high in fat, sodium, and added sugar. Shop around the outside edge of the  grocery store. This is where you will most often find fresh fruits and vegetables, bulk grains, fresh meats, and fresh dairy products. Cooking Use low-heat cooking methods, such as baking, instead of high-heat cooking methods, such as deep frying. Cook using healthy oils, such as olive, canola, or sunflower oil. Avoid cooking with butter, cream, or high-fat meats. Meal planning Eat meals and snacks regularly, preferably at the same times every  day. Avoid going long periods of time without eating. Eat foods that are high in fiber, such as fresh fruits, vegetables, beans, and whole grains. Eat 4-6 oz (112-168 g) of lean protein each day, such as lean meat, chicken, fish, eggs, or tofu. One ounce (oz) (28 g) of lean protein is equal to: 1 oz (28 g) of meat, chicken, or fish. 1 egg.  cup (62 g) of tofu. Eat some foods each day that contain healthy fats, such as avocado, nuts, seeds, and fish. What foods should I eat? Fruits Berries. Apples. Oranges. Peaches. Apricots. Plums. Grapes. Mangoes. Papayas. Pomegranates. Kiwi. Cherries. Vegetables Leafy greens, including lettuce, spinach, kale, chard, collard greens, mustard greens, and cabbage. Beets. Cauliflower. Broccoli. Carrots. Green beans. Tomatoes. Peppers. Onions. Cucumbers. Brussels sprouts. Grains Whole grains, such as whole-wheat or whole-grain bread, crackers, tortillas, cereal, and pasta. Unsweetened oatmeal. Quinoa. Brown or wild rice. Meats and other proteins Seafood. Poultry without skin. Lean cuts of poultry and beef. Tofu. Nuts. Seeds. Dairy Low-fat or fat-free dairy products such as milk, yogurt, and cheese. The items listed above may not be a complete list of foods and beverages you can eat and drink. Contact a dietitian for more information. What foods should I avoid? Fruits Fruits canned with syrup. Vegetables Canned vegetables. Frozen vegetables with butter or cream sauce. Grains Refined white flour and flour products such as bread, pasta, snack foods, and cereals. Avoid all processed foods. Meats and other proteins Fatty cuts of meat. Poultry with skin. Breaded or fried meats. Processed meat. Avoid saturated fats. Dairy Full-fat yogurt, cheese, or milk. Beverages Sweetened drinks, such as soda or iced tea. The items listed above may not be a complete list of foods and beverages you should avoid. Contact a dietitian for more information. Questions to ask a  health care provider Do I need to meet with a certified diabetes care and education specialist? Do I need to meet with a dietitian? What number can I call if I have questions? When are the best times to check my blood glucose? Where to find more information: American Diabetes Association: diabetes.org Academy of Nutrition and Dietetics: eatright.[employer] of Diabetes and Digestive and Kidney Diseases: [employer]is Association of Diabetes Care & Education Specialists: diabeteseducator.org Summary It is important to have healthy eating habits because your blood sugar (glucose) levels are greatly affected by what you eat and drink. It is important to use alcohol carefully. A healthy meal plan will help you manage your blood glucose and lower your risk of heart disease. Your health care provider may recommend that you work with a dietitian to make a meal plan that is best for you. This information is not intended to replace advice given to you by your health care provider. Make sure you discuss any questions you have with your health care provider. Document Revised: 01/13/2020 Document Reviewed: 01/13/2020 Elsevier Patient Education  [place].

## 2022-03-16 LAB — BMP8+EGFR
BUN/Creatinine Ratio: 12 (ref 12–28)
BUN: 12 mg/dL (ref 8–27)
CO2: 28 mmol/L (ref 20–29)
Calcium: 9.1 mg/dL (ref 8.7–10.3)
Chloride: 100 mmol/L (ref 96–106)
Creatinine, Ser: 0.97 mg/dL (ref 0.57–1.00)
Glucose: 96 mg/dL (ref 70–99)
Potassium: 4.1 mmol/L (ref 3.5–5.2)
Sodium: 140 mmol/L (ref 134–144)
eGFR: 62 mL/min/{1.73_m2} (ref >59)

## 2022-03-16 LAB — CBC
Hematocrit: 33.5 % — ABNORMAL LOW (ref 34.0–46.6)
Hemoglobin: 10.5 g/dL — ABNORMAL LOW (ref 11.1–15.9)
MCH: 23.4 pg — ABNORMAL LOW (ref 26.6–33.0)
MCHC: 31.3 g/dL — ABNORMAL LOW (ref 31.5–35.7)
MCV: 75 fL — ABNORMAL LOW (ref 79–97)
Platelets: 282 10*3/uL (ref 150–450)
RBC: 4.48 x10E6/uL (ref 3.77–5.28)
RDW: 15.7 % — ABNORMAL HIGH (ref 11.7–15.4)
WBC: 4.6 10*3/uL (ref 3.4–10.8)

## 2022-03-16 LAB — HEMOGLOBIN A1C
Est. average glucose Bld gHb Est-mCnc: 154 mg/dL
Hgb A1c MFr Bld: 7 % — ABNORMAL HIGH (ref 4.8–5.6)

## 2022-03-19 LAB — SPECIMEN STATUS REPORT

## 2022-03-20 ENCOUNTER — Ambulatory Visit (HOSPITAL_COMMUNITY)
Admission: RE | Admit: 2022-03-20 | Discharge: 2022-03-20 | Disposition: A | Discharge status: Home / Self Care | Payer: Medicare HMO | Source: Ambulatory Visit | Attending: Cardiology | Admitting: Cardiology

## 2022-03-20 VITALS — BP 130/72 | HR 65 | Wt 187.0 lb

## 2022-03-20 DIAGNOSIS — E669 Obesity, unspecified: Secondary | ICD-10-CM | POA: Diagnosis not present

## 2022-03-20 DIAGNOSIS — Z87891 Personal history of nicotine dependence: Secondary | ICD-10-CM | POA: Diagnosis not present

## 2022-03-20 DIAGNOSIS — J439 Emphysema, unspecified: Secondary | ICD-10-CM | POA: Insufficient documentation

## 2022-03-20 DIAGNOSIS — Z79899 Other long term (current) drug therapy: Secondary | ICD-10-CM | POA: Insufficient documentation

## 2022-03-20 DIAGNOSIS — J8410 Pulmonary fibrosis, unspecified: Secondary | ICD-10-CM | POA: Diagnosis not present

## 2022-03-20 DIAGNOSIS — G4733 Obstructive sleep apnea (adult) (pediatric): Secondary | ICD-10-CM | POA: Diagnosis not present

## 2022-03-20 DIAGNOSIS — I5032 Chronic diastolic (congestive) heart failure: Secondary | ICD-10-CM | POA: Insufficient documentation

## 2022-03-20 DIAGNOSIS — I110 Hypertensive heart disease with heart failure: Secondary | ICD-10-CM | POA: Diagnosis not present

## 2022-03-20 DIAGNOSIS — I272 Pulmonary hypertension, unspecified: Secondary | ICD-10-CM | POA: Diagnosis not present

## 2022-03-20 NOTE — Patient Instructions (Signed)
There has been no change to your medication.  Your physician has requested that you have an echocardiogram. Echocardiography is a painless test that uses sound waves to create images of your heart. It provides your doctor with information about the size and shape of your heart and how well your heart's chambers and valves are working. This procedure takes approximately one hour. There are no restrictions for this procedure.  Your physician recommends that you schedule a follow-up appointment in: 6 months with echocardiogram ( March 2024)  ** please call the office in January to arrange your follow up appointment **  If you have any questions or concerns before your next appointment please send [place] a message through [place] or call our office at [phone].    TO LEAVE A MESSAGE FOR THE NURSE SELECT OPTION 2, PLEASE LEAVE A MESSAGE INCLUDING: YOUR NAME DATE OF BIRTH CALL BACK NUMBER REASON FOR CALL**this is important as we prioritize the call backs  YOU WILL RECEIVE A CALL BACK THE SAME DAY AS LONG AS YOU CALL BEFORE 4:00 PM  At the [facility], you and your health needs are our priority. As part of our continuing mission to provide you with exceptional heart care, we have created designated Provider Care Teams. These Care Teams include your primary Cardiologist (physician) and Advanced Practice Providers (APPs- Physician Assistants and Nurse Practitioners) who all work together to provide you with the care you need, when you need it.   You may see any of the following providers on your designated Care Team at your next follow up: Dr Glori Bickers Dr Loralie Champagne Dr. Roxana Hires, NP Lyda Jester, [place] [facility] [place], [place] Forestine Na, NP Audry Riles, PharmD   Please be sure to bring in all your medications bottles to every appointment.

## 2022-03-21 NOTE — Progress Notes (Signed)
PCP: [name], MD Cardiology: Dr. Ellyn Hack HF Cardiology: Dr. [name]  76 y.o. with history of OHS/OSA and RV dysfunction was referred by Dr. Ellyn Hack for evaluation of CHF.  She has a history of OSA/OHS.  She uses CPAP at night but not currently using oxygen during the day.  Remote smoker.  She had an echo in 9/20 showing EF 50-55% but moderate-severe RV dysfunction with mild-moderate RV dilation.  RHC/LHC in 10/20 showed normal coronaries, pulmonary venous hypertension.  V/Q scan in 2/21 did not show evidence for acute on chronic PE.    Cardiomems was placed in 5/21.  RHC at that time showed normal filling pressures and moderate pulmonary hypertension but suspect due to high output.  No evidence for shunt lesion.  Echo showed EF 60-65%, enlarged RV with normal systolic function.   She was admitted in 5/21 with syncope, thought to be due to a seizure.    She had COVID-19 infection in 8/21. 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   Echo in 9/21 with EF 50-55%, severe RV enlargement and severely decreased RV systolic function, PASP 54 mmHg.    She was admitted in 5/22 with CHF, bronchitis.  She was diuresed and treated with antibiotics. Echo in 5/22 showed EF 60-65%, mild LVH, ?normal RV, severe LAE, ?normal PA pressure, IVC normal.  V/Q scan negative.   In 10/22, she was admitted with GI bleeding, thought to be diverticular.  BP meds were decreased and Lasix was cut back.  With increased weight, she increased her Lasix back to 80 mg daily.   She returns for followup of CHF.  Weight down 1 lb. She is on semaglutide.  BP is controlled.  She uses a cane for balance. Mild dyspnea walking into the office today.  She exercises by walking 20 laps around her driveway.  Occasional lightheadedness with standing, no falls or syncope.  No orthopnea/PND.  She is using CPAP.  Still coughing and wheezing  occasionally, thought to be related to asthma.   ECG (personally reviewed): NSR, iRBBB  Cardiomems PADP 20 mmHg (goal 20).   Labs (10/20): K 4.1, creatinine 1.15, hgb 11.4 Labs (6/21): K 3.5, creatinine 1.04, BNP 81 Labs (12/21): K 3.7, creatinine 0.79 Labs (11/22): LDL 63, BNP 33 Labs (2/23): K 3.8, creatinine 0.97 Labs (9/23): K 4.1, creatinine 0.97  PMH: 1. OHS/OSA: She uses CPAP.  2. H/o CVA 3. Seizure disorder 4. Type 2 DM 5. Depression 6. HTN 7. Hyperlipidemia 8. Chronic diastolic CHF: Echo (1/09) with EF 50-55%, moderate to severe RV dilation with mild-moderately decreased RV systolic function.  - RHC/LHC (10/20): normal coronaries; mean RA 10, PA 56/14 mean 31, mean PCWP 13, LVEDP 18, CO/CI 7.35/3.73, PVR 2.44.  - RHC/Cardiomems placement (5/21): mean RA 7, PA 58/21 mean 34, PCWP mean 12, CI 8.3 F/3.95 T, PVR 2.86 WU. No evidence for shunt lesion.  - Echo (5/21): EF 60-65%, RV enlarged with normal systolic function.  - Echo (9/21): EF 50-55%, severe RV enlargement and severely decreased RV systolic function, PASP 54 mmHg.  - Echo (5/22): EF 60-65%, mild LVH, ?normal RV, severe LAE, ?normal PA pressure, IVC normal. 9. Primarily pulmonary venous hypertension.  - V/Q scan (2/21, 5/22): No evidence for acute or chronic PE.  - PFTs normal in 5/21.  10. COVID-19 PNA 8/21.  - CT chest in 10/21 with emphysema and mild fibrosis concerning for post-infectious inflammatory fibrosis from  COVID.  11. GI bleeding: 10/22, diverticular bleed.   Social History   Socioeconomic History   Marital status: Divorced    Spouse name: Not on file   Number of children: Not on file   Years of education: Not on file   Highest education level: Not on file  Occupational History   Occupation: Disabled    Comment: CNA  Tobacco Use   Smoking status: Former    Packs/day: 0.50    Years: 25.00    Total pack years: 12.50    Types: Cigarettes   Smokeless tobacco: Never   Tobacco comments:     quit smoking 20+ytrs ago  Vaping Use   Vaping Use: Never used  Substance and Sexual Activity   Alcohol use: No   Drug use: No   Sexual activity: Not Currently    Birth control/protection: Surgical  Other Topics Concern   Not on file  Social History Narrative   She lives in [place]. She has lots of family in the area and is accompanied by her sister.   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   She has a distant history of smoking, quit 20 years ago.   Right handed    Social Determinants of Health   Financial Resource Strain: Low Risk  (07/06/2021)   Overall Financial Resource Strain (CARDIA)    Difficulty of Paying Living Expenses: Not hard at all  Food Insecurity: No Food Insecurity (07/06/2021)   Hunger Vital Sign    Worried About Running Out of Food in the Last Year: Never true    Ran Out of Food in the Last Year: Never true  Transportation Needs: No Transportation Needs (07/06/2021)   PRAPARE - Hydrologist (Medical): No    Lack of Transportation (Non-Medical): No  Recent Concern: Transportation Needs - Unmet Transportation Needs (04/26/2021)   PRAPARE - Transportation    Lack of Transportation (Medical): Yes    Lack of Transportation (Non-Medical): Yes  Physical Activity: Inactive (07/06/2021)   Exercise Vital Sign    Days of Exercise per Week: 0 days    Minutes of Exercise per Session: 0 min  Stress: No Stress Concern Present (07/06/2021)   [place]    Feeling of Stress : Only a little  Social Connections: Not on file  Intimate Partner Violence: Not At Risk (10/22/2018)   Humiliation, Afraid, Rape, and Kick questionnaire    Fear of Current or Ex-Partner: No    Emotionally Abused: No    Physically Abused: No    Sexually Abused: No   Family History  Problem Relation Age of Onset   Allergies Father    Heart disease Father        before 13    Heart failure Father    Hypertension Father    Hyperlipidemia Father    Heart disease Mother    Diabetes Mother    Hypertension Mother    Hyperlipidemia Mother    Hypertension Sister    Heart disease Sister        before 83   Hyperlipidemia Sister    Diabetes Brother    Hypertension Brother    Diabetes Sister    Hypertension Sister    Diabetes Son    Hypertension Son    Cancer Other    Current Outpatient Medications  Medication Sig Dispense Refill   acetaminophen (TYLENOL) 500 MG tablet Take 500-1,000 mg by  mouth every 4 (four) hours as needed for moderate pain.      albuterol (VENTOLIN HFA) 108 (90 Base) MCG/ACT inhaler Inhale 1-2 puffs into the lungs every 6 (six) hours as needed for wheezing or shortness of breath. 18 g 5   Alcohol Swabs (DROPSAFE ALCOHOL PREP) 70 % PADS USE TWO TIMES DAILY AS DIRECTED 200 each 6   Ascorbic Acid (VITAMIN C) 1000 MG tablet Take 1,000 mg by mouth daily.     atorvastatin (LIPITOR) 10 MG tablet TAKE 1 TABLET EVERY EVENING 90 tablet 2   B-D ULTRAFINE III SHORT PEN 31G X 8 MM MISC USE DAILY WITH VICTOZA AND/OR NOVOLOG. 400 each 2   benzonatate (TESSALON) 100 MG capsule Take 100 mg by mouth 3 (three) times daily as needed for cough.     Blood Glucose Calibration (TRUE METRIX LEVEL 1) Low SOLN Use as directed  Dx:e11.22 1 each 2   Blood Glucose Monitoring Suppl (TRUE METRIX METER) w/Device KIT USE AS DIRECTED 1 kit 1   carvedilol (COREG) 12.5 MG tablet Take 1 tablet (12.5 mg total) by mouth 2 (two) times daily. 180 tablet 3   dexlansoprazole (DEXILANT) 60 MG capsule Take 1 capsule (60 mg total) by mouth daily. 90 capsule 2   diclofenac sodium (VOLTAREN) 1 % GEL Apply 2 g topically 4 (four) times daily as needed (pain).     ferrous sulfate 324 MG TBEC Patient takes 1 tablet by mouth every other day.     furosemide (LASIX) 20 MG tablet TAKE 4 TABLETS (80 MG) BY MOUTH IN THE MORNING AND 3 TABLETS (60 MG) EVERY EVENING. 210 tablet 6   Glucagon (GVOKE  HYPOPEN 1-PACK) 0.5 MG/0.1ML SOAJ Inject 0.5 mg into the skin daily as needed. 0.1 mL 11   glucose blood (TRUE METRIX BLOOD GLUCOSE TEST) test strip USE AS DIRECTED TO CHECK BLOOD SUGARS 2 TIMES PER DAY 200 strip 2   hydrALAZINE (APRESOLINE) 100 MG tablet Take 1 tablet (100 mg total) by mouth 3 (three) times daily. 270 tablet 3   hydrocortisone cream 1 % Apply 1 application topically daily as needed for itching.      insulin lispro (HUMALOG KWIKPEN) 100 UNIT/ML KwikPen Inject 0.02-0.04 mLs (2-4 Units total) into the skin 3 (three) times daily as needed (high blood sugar over 150). 15 mL 2   Isopropyl Alcohol (ALCOHOL WIPES) 70 % MISC Apply 1 each topically 2 (two) times daily. 300 each 3   isosorbide mononitrate (IMDUR) 30 MG 24 hr tablet TAKE 1 TABLET EVERY DAY 90 tablet 2   lamoTRIgine (LAMICTAL) 150 MG tablet Take 1 tablet (150 mg total) by mouth 2 (two) times daily. 180 tablet 3   levETIRAcetam (KEPPRA) 500 MG tablet Take 1,000 mg by mouth at bedtime.     ondansetron (ZOFRAN) 4 MG tablet Take 1 tablet (4 mg total) by mouth every 8 (eight) hours as needed for nausea or vomiting. 20 tablet 1   OZEMPIC, 1 MG/DOSE, 4 MG/3ML SOPN INJECT 1 MG INTO THE SKIN ONCE A WEEK. 9 mL 3   Pancrelipase, Lip-Prot-Amyl, 25000-79000 units CPEP Take 1-2 capsules by mouth See admin instructions. Take 1 capsules in the morning with breakfast, 1 capsule with lunch, and 2 capsules with supper     potassium chloride SA (KLOR-CON M) 20 MEQ tablet Take 2 tablets (40 mEq total) by mouth in the morning AND 1 tablet (20 mEq total) every evening. With additional 60 on Metolazone days. 300 tablet 2   sennosides-docusate sodium (  SENOKOT-S) 8.6-50 MG tablet Take 1-2 tablets by mouth at bedtime as needed for constipation.     sertraline (ZOLOFT) 50 MG tablet TAKE 1 TABLET EVERY DAY 90 tablet 2   tolnaftate (TINACTIN) 1 % cream Apply 1 application topically daily as needed (foot fungus).      TRUEplus Lancets 33G MISC TEST BLOOD  SUGAR TWICE DAILY AS DIRECTED 200 each 3   Budeson-Glycopyrrol-Formoterol (BREZTRI AEROSPHERE) 160-9-4.8 MCG/ACT AERO Inhale 2 puffs into the lungs in the morning and at bedtime. (Patient not taking: Reported on 03/20/2022) 10.7 g 2   guaiFENesin-dextromethorphan (ROBITUSSIN DM) 100-10 MG/5ML syrup Take 10 mLs by mouth every 6 (six) hours as needed for cough. (Patient not taking: Reported on 03/15/2022) 118 mL 0   insulin glargine, 1 Unit Dial, (TOUJEO SOLOSTAR) 300 UNIT/ML Solostar Pen Inject 25 Units into the skin daily. 4.5 mL 1   No current facility-administered medications for this encounter.   BP 130/72   Pulse 65   Wt 84.8 kg (187 lb)   SpO2 97%   BMI 35.33 kg/m  General: NAD Neck: No JVD, no thyromegaly or thyroid nodule.  Lungs: Clear to auscultation bilaterally with normal respiratory effort. CV: Nondisplaced PMI.  Heart regular S1/S2, no S3/S4, no murmur.  No peripheral edema.  No carotid bruit.  Normal pedal pulses.  Abdomen: Soft, nontender, no hepatosplenomegaly, no distention.  Skin: Intact without lesions or rashes.  Neurologic: Alert and oriented x 3.  Psych: Normal affect. Extremities: No clubbing or cyanosis.  HEENT: Normal.   Assessment/Plan: 1. Chronic diastolic CHF/RV failure: Echo in 9/20 showed EF 50-55% with moderate-severe RV dilation and mild-moderately decreased systolic function. RHC/LHC showed mild-moderate pulmonary venous hypertension and no significant coronary disease.  Cause of RV dysfunction is not clear, possibly due to diastolic CHF + OHS/OSA.  V/Q scan in 2/21 did not show acute or chronic PE.  Cardiomems was placed in 5/21, normal filling pressures with moderate pulmonary hypertension likely due to high output (no evidence for shunt lesion on RHC).  Echo in 9/21 with EF 50-55%, severe RVE with severe RV dysfunction.  Echo in 5/22 with EF 60-65%, RV function reportedly normal with normal PA pressure.  Her dyspnea is partially related to lung disease;  based on last CT, she has developed mild pulmonary fibrosis since COVID, possibly post-inflammatory.  Cardiomems 20 mmHg today which is at goal.  NYHA class II-III, she does not look volume overloaded on exam today.  - Continue Lasix 80 qam/60 qpm. Stable BMET recently.   - She did not tolerate SGLT2-inhibitor due to recurrent yeast infections.    - I will arrange for echo at followup to reassess RV.  2. OHS/OSA: She is using CPAP but not oxygen during the day (no longer appears to need). CT chest in 10/21 with emphysema and mild fibrosis, possibly post-inflammatory after COVID-19 infection.   - Followup with pulmonary (Dr. Vaughan Browner).  3. Pulmonary hypertension: RHC in 10/20 showed mild-moderate pulmonary venous hypertension.  RHC in 5/21 showed moderate pulmonary hypertension with low PVR and high CO, likely PH due to high output, no evidence for shunt lesion. No role for selective pulmonary vasodilators.  4. HTN: BP controlled on hydralazine.  5. Obesity: She is on semaglutide, weight trending down.  6. GI bleeding: No overt bleeding now.    Followup in 6 mos with echo  Loralie Champagne 03/21/2022

## 2022-03-26 ENCOUNTER — Telehealth: Payer: Self-pay

## 2022-03-26 ENCOUNTER — Ambulatory Visit: Payer: Medicare HMO | Admitting: Neurology

## 2022-03-26 NOTE — Chronic Care Management (AMB) (Signed)
   [name] was reminded to have all medications, supplements and any blood glucose and blood pressure readings available for review with [name], Pharm. D, at her telephone visit on 03-28-2022 at 10:00   Questions: Have you had any recent office visit or specialist visit outside of [facility]? Patient stated no  Are there any concerns you would like to discuss during your office visit? Patient stated no  Are you having any problems obtaining your medications? (Whether it pharmacy issues or cost) Patient stated no  If patient has any PAP medications ask if they are having any problems getting their PAP medication or refill? No PAP medications  Care Gaps: Covid booster overdue Yearly ophthalmology exam overdue  Star Rating Drug: Ozempic 1 mg- Last filled 02-01-2022 90 DS [employer] Atorvastatin 10 mg- Last filled 03-01-2022 90 DS [employer]   Any gaps in medications fill history? No  [place] Pharmacist Assistant [phone]

## 2022-03-28 ENCOUNTER — Telehealth: Payer: Self-pay

## 2022-03-28 ENCOUNTER — Ambulatory Visit (INDEPENDENT_AMBULATORY_CARE_PROVIDER_SITE_OTHER): Payer: Medicare HMO

## 2022-03-28 ENCOUNTER — Other Ambulatory Visit: Payer: Self-pay

## 2022-03-28 DIAGNOSIS — I110 Hypertensive heart disease with heart failure: Secondary | ICD-10-CM

## 2022-03-28 DIAGNOSIS — I272 Pulmonary hypertension, unspecified: Secondary | ICD-10-CM

## 2022-03-28 DIAGNOSIS — R569 Unspecified convulsions: Secondary | ICD-10-CM

## 2022-03-28 DIAGNOSIS — K921 Melena: Secondary | ICD-10-CM

## 2022-03-28 DIAGNOSIS — I25118 Atherosclerotic heart disease of native coronary artery with other forms of angina pectoris: Secondary | ICD-10-CM

## 2022-03-28 DIAGNOSIS — E1122 Type 2 diabetes mellitus with diabetic chronic kidney disease: Secondary | ICD-10-CM

## 2022-03-28 NOTE — Progress Notes (Signed)
Chronic Care Management Pharmacy Note  04/04/2022 Name:  [name] MRN:  [id number] DOB:  Feb 16, 1946  Summary: Patient reports that she had a lot of food yesterday.   Recommendations/Changes made from today's visit: Recommend patient have annual eye exam completed.   Plan: Collaborate with Dr. Patrici Ranks team to see if patient can be seen this year for her diabetic eye exam.  Recommend patient use the plate method for her meals.    Subjective: [name] is an 76 y.o. year old female who is a primary patient of [name], MD.  The CCM team was consulted for assistance with disease management and care coordination needs.    Engaged with patient by telephone for follow up visit in response to provider referral for pharmacy case management and/or care coordination services. Patient  Consent to Services:  The patient was given information about Chronic Care Management services, agreed to services, and gave verbal consent prior to initiation of services.  Please see initial visit note for detailed documentation.   Patient Care Team: [name], MD as PCP - General (Internal Medicine) Leonie Man, MD as PCP - Cardiology (Cardiology) Conni Slipper, NP as Referring Physician (Nurse Practitioner) Arta Silence, MD as Attending Physician (Gastroenterology) Elayne Snare, MD as Attending Physician (Endocrinology) Matilde Bash, MD as Referring Physician (Neurology) Inocencio Homes, DPM as Consulting Physician (Podiatry) Cameron Sprang, MD as Consulting Physician (Neurology)  Recent office visits: 03/15/2022 PCP OV  Recent consult visits: 03/20/2022 Cardiology [facility] visits: 12/28/2021 ED Provider notes    Objective:  Lab Results  Component Value Date   CREATININE 0.97 03/15/2022   BUN 12 03/15/2022   GFR 86.68 05/15/2016   EGFR 62 03/15/2022   GFRNONAA 59 (L) 12/28/2021   GFRAA 75 06/06/2020   NA 140 03/15/2022   K 4.1 03/15/2022   CALCIUM 9.1  03/15/2022   CO2 28 03/15/2022   GLUCOSE 96 03/15/2022    Lab Results  Component Value Date/Time   HGBA1C 7.0 (H) 03/15/2022 04:59 PM   HGBA1C 7.3 (H) 12/12/2021 05:13 PM   FRUCTOSAMINE 290 (H) 05/15/2016 01:35 PM   FRUCTOSAMINE 282 07/01/2015 10:41 AM   GFR 86.68 05/15/2016 01:35 PM   GFR 82.17 04/17/2016 09:55 AM   MICROALBUR 10 06/06/2020 01:23 PM   MICROALBUR 10 01/06/2020 12:16 PM    Last diabetic Eye exam:  Lab Results  Component Value Date/Time   HMDIABEYEEXA No Retinopathy 02/13/2021 12:00 AM    Last diabetic Foot exam: No results found for: "HMDIABFOOTEX"   Lab Results  Component Value Date   CHOL 145 05/04/2021   HDL 79 05/04/2021   LDLCALC 63 05/04/2021   LDLDIRECT 66.0 01/27/2015   TRIG 16 05/04/2021   CHOLHDL 1.8 05/04/2021       Latest Ref Rng & Units 12/28/2021   11:51 AM 08/17/2021   12:17 PM 04/01/2021    8:55 AM  Hepatic Function  Total Protein 6.5 - 8.1 g/dL 7.1  7.4  6.6   Albumin 3.5 - 5.0 g/dL 3.4  4.2  3.1   AST 15 - 41 U/L 19  19  17    ALT 0 - 44 U/L 13  12  14    Alk Phosphatase 38 - 126 U/L 98  120  88   Total Bilirubin 0.3 - 1.2 mg/dL 0.4  0.3  1.5     Lab Results  Component Value Date/Time   TSH 2.010 03/27/2021 03:36 PM   TSH 1.500 11/09/2019  12:21 AM   TSH 1.332 02/25/2019 03:32 AM   TSH 0.768 08/13/2010 08:36 AM       Latest Ref Rng & Units 03/15/2022    4:59 PM 12/28/2021   11:56 AM 12/28/2021   11:51 AM  CBC  WBC 3.4 - 10.8 x10E3/uL 4.6   4.8   Hemoglobin 11.1 - 15.9 g/dL 10.5  10.9  9.8   Hematocrit 34.0 - 46.6 % 33.5  32.0  32.7   Platelets 150 - 450 x10E3/uL 282   244     No results found for: "VD25OH"  Clinical ASCVD: No  The 10-year ASCVD risk score (Arnett DK, et al., 2019) is: 27.7%   Values used to calculate the score:     Age: 76 years     Sex: Female     Is Non-Hispanic African American: Yes     Diabetic: Yes     Tobacco smoker: No     Systolic Blood Pressure: 503 mmHg     Is BP treated: Yes     HDL  Cholesterol: 79 mg/dL     Total Cholesterol: 145 mg/dL       03/15/2022    4:06 PM 12/12/2021    4:14 PM 07/06/2021    3:18 PM  Depression screen PHQ 2/9  Decreased Interest 2 0 1  Down, Depressed, Hopeless 2 0 1  PHQ - 2 Score 4 0 2  Altered sleeping 0 0 3  Tired, decreased energy 1 0 3  Change in appetite 0 0 0  Feeling bad or failure about yourself  1 0 0  Trouble concentrating 0 0 0  Moving slowly or fidgety/restless 0 0 0  Suicidal thoughts 0 0 0  PHQ-9 Score 6 0 8  Difficult doing work/chores Somewhat difficult Not difficult at all Not difficult at all     Social History   Tobacco Use  Smoking Status Former   Packs/day: 0.50   Years: 25.00   Total pack years: 12.50   Types: Cigarettes  Smokeless Tobacco Never  Tobacco Comments   quit smoking 20+ytrs ago   BP Readings from Last 3 Encounters:  03/20/22 130/72  03/15/22 132/70  12/28/21 (!) 123/58   Pulse Readings from Last 3 Encounters:  03/20/22 65  03/15/22 67  12/28/21 63   Wt Readings from Last 3 Encounters:  03/20/22 187 lb (84.8 kg)  03/15/22 185 lb 12.8 oz (84.3 kg)  12/28/21 189 lb 9.5 oz (86 kg)   BMI Readings from Last 3 Encounters:  03/20/22 35.33 kg/m  03/15/22 35.11 kg/m  12/28/21 35.82 kg/m    Assessment/Interventions: Review of patient past medical history, allergies, medications, health status, including review of consultants reports, laboratory and other test data, was performed as part of comprehensive evaluation and provision of chronic care management services.   SDOH:  (Social Determinants of Health) assessments and interventions performed: No SDOH Interventions    Flowsheet Row Office Visit from 04/10/2021 in [facility] Most recent reading at 05/21/2021 10:53 PM Chronic Care Management from 04/26/2021 in [place] Most recent reading at 04/26/2021  2:49 PM Office Visit from 01/09/2021 in [place] Most  recent reading at 01/09/2021  3:46 PM Chronic Care Management from 02/01/2020 in [place] Most recent reading at 02/01/2020  5:44 PM Chronic Care Management from 12/21/2019 in [place] Most recent reading at 12/21/2019  3:21 PM Chronic Care Management from 12/01/2019 in [place]  Most recent reading at 12/21/2019  2:20 PM  SDOH Interventions        Transportation Interventions -- [employer] -- -- -- --  Depression Interventions/Treatment  Currently on Treatment -- Medication -- -- --  Financial Strain Interventions -- -- -- Intervention Not Indicated Intervention Not Indicated Intervention Not Indicated      SDOH Screenings   Food Insecurity: No Food Insecurity (07/06/2021)  Housing: Low Risk  (03/11/2019)  Transportation Needs: No Transportation Needs (07/06/2021)  Recent Concern: Transportation Needs - Unmet Transportation Needs (04/26/2021)  Depression (PHQ2-9): Medium Risk (03/15/2022)  Financial Resource Strain: Low Risk  (07/06/2021)  Physical Activity: Inactive (07/06/2021)  Stress: No Stress Concern Present (07/06/2021)  Tobacco Use: Medium Risk (03/15/2022)    CCM Care Plan  Allergies  Allergen Reactions   Other Anaphylaxis and Rash    Bleach   Penicillins Hives    Did it involve swelling of the face/tongue/throat, SOB, or low BP? No Did it involve sudden or severe rash/hives, skin peeling, or any reaction on the inside of your mouth or nose? Yes Did you need to seek medical attention at a hospital or doctor's office? Yes When did it last happen?      Childhood allergy  If all above answers are "NO", may proceed with cephalosporin use.     Sulfa Antibiotics Hives   Aspirin Other (See Comments)     On aspirin 81 mg - Rectal bleeding in dec 2018   Codeine     Headache and makes the patient feel "off"    Medications Reviewed Today     Reviewed by Asencion Noble, CMA (Certified Medical  Assistant) on 03/20/22 at 1156  Med List Status: <None>   Medication Order Taking? Sig Documenting Provider Last Dose Status Informant  acetaminophen (TYLENOL) 500 MG tablet [id number] Yes Take 500-1,000 mg by mouth every 4 (four) hours as needed for moderate pain.  [provider] Taking Active Self  albuterol (VENTOLIN HFA) 108 (90 Base) MCG/ACT inhaler [id number] Yes Inhale 1-2 puffs into the lungs every 6 (six) hours as needed for wheezing or shortness of breath. Marshell Garfinkel, MD Taking Active   Alcohol Swabs (DROPSAFE ALCOHOL PREP) 70 % PADS [id number] Yes USE TWO TIMES DAILY AS DIRECTED [name], MD Taking Active   Ascorbic Acid (VITAMIN C) 1000 MG tablet [id number] Yes Take 1,000 mg by mouth daily. [provider] Taking Active Self  atorvastatin (LIPITOR) 10 MG tablet [id number] Yes TAKE 1 TABLET EVERY Fredric Mare, MD Taking Active   B-D ULTRAFINE III SHORT PEN 31G X 8 MM MISC [id number] Yes USE DAILY WITH VICTOZA AND/OR NOVOLOG. [name], MD Taking Active   benzonatate [facility]) 100 MG capsule [id number] Yes Take 100 mg by mouth 3 (three) times daily as needed for cough. [provider] Taking Active   Blood Glucose Calibration (TRUE METRIX LEVEL 1) Low SOLN [id number] Yes Use as directed  Dx:e11.22 [name], MD Taking Active Self  Blood Glucose Monitoring Suppl (TRUE METRIX METER) w/Device Drucie Opitz [id number] Yes USE AS DIRECTED [name], MD Taking Active   Budeson-Glycopyrrol-Formoterol (BREZTRI AEROSPHERE) 160-9-4.8 MCG/ACT AERO [id number] No Inhale 2 puffs into the lungs in the morning and at bedtime.  Patient not taking: Reported on 03/20/2022   [name], MD Not Taking Active   carvedilol (COREG) 12.5 MG tablet [id number] Yes Take 1 tablet (12.5 mg total) by mouth 2 (two) times daily. Larey Dresser, MD Taking Active   dexlansoprazole (DEXILANT) 60 MG  capsule [id number] Yes Take 1 capsule (60 mg total) by mouth daily. [name], MD Taking Active   diclofenac sodium (VOLTAREN) 1 % GEL [id number] Yes Apply 2 g topically 4 (four) times daily as needed (pain). Geradine Girt, DO Taking Active Self  ferrous sulfate 324 MG TBEC [id number] Yes Patient takes 1 tablet by mouth every other day. [provider] Taking Active Self  furosemide (LASIX) 20 MG tablet [id number] Yes TAKE 4 TABLETS (80 MG) BY MOUTH IN THE MORNING AND 3 TABLETS (60 MG) EVERY EVENING. Whittingham, Hunter, FNP Taking Active   Glucagon (GVOKE HYPOPEN 1-PACK) 0.5 MG/0.1ML Darden Palmer [id number] Yes Inject 0.5 mg into the skin daily as needed. [name], MD Taking Active Self           Med Note Gilman Buttner, Marissa Calamity May 04, 2021  9:33 AM)    glucose blood (TRUE METRIX BLOOD GLUCOSE TEST) test strip [id number] Yes USE AS DIRECTED TO CHECK BLOOD SUGARS 2 TIMES PER DAY [name], MD Taking Active Self  guaiFENesin-dextromethorphan [facility] DM) 100-10 MG/5ML syrup [id number] No Take 10 mLs by mouth every 6 (six) hours as needed for cough.  Patient not taking: Reported on 03/15/2022   [place] Eric J, DO Not Taking Active Self  hydrALAZINE (APRESOLINE) 100 MG tablet [id number] Yes Take 1 tablet (100 mg total) by mouth 3 (three) times daily. Larey Dresser, MD Taking Active   hydrocortisone cream 1 % [id number] Yes Apply 1 application topically daily as needed for itching.  [provider] Taking Active Self  insulin glargine, 1 Unit Dial, (TOUJEO SOLOSTAR) 300 UNIT/ML Solostar Pen [id number]  Inject 25 Units into the skin daily. Minette Brine, FNP  Active   insulin lispro (HUMALOG KWIKPEN) 100 UNIT/ML KwikPen [id number] Yes Inject 0.02-0.04 mLs (2-4 Units total) into the skin 3 (three) times daily as needed (high blood sugar over 150). Bary Castilla, NP Taking Active Self  Isopropyl Alcohol (ALCOHOL WIPES) 70 % MISC [id number] Yes Apply 1 each topically 2 (two) times daily. Minette Brine, FNP Taking Active Self  isosorbide mononitrate  (IMDUR) 30 MG 24 hr tablet [id number] Yes TAKE 1 TABLET EVERY DAY Leonie Man, MD Taking Active   lamoTRIgine (LAMICTAL) 150 MG tablet [id number] Yes Take 1 tablet (150 mg total) by mouth 2 (two) times daily. Cameron Sprang, MD Taking Active   levETIRAcetam (KEPPRA) 500 MG tablet [id number] Yes Take 1,000 mg by mouth at bedtime. [provider] Taking Active   ondansetron (ZOFRAN) 4 MG tablet [id number] Yes Take 1 tablet (4 mg total) by mouth every 8 (eight) hours as needed for nausea or vomiting. [name], MD Taking Active   OZEMPIC, 1 MG/DOSE, 4 MG/3ML Bon[facility] Aid [id number] Yes INJECT 1 MG INTO THE SKIN ONCE A WEEK. [name], MD Taking Active   Pancrelipase, Lip-Prot-Amyl, 25000-79000 units CPEP [id number] Yes Take 1-2 capsules by mouth See admin instructions. Take 1 capsules in the morning with breakfast, 1 capsule with lunch, and 2 capsules with supper [provider] Taking Active Self           Med Note Gilman Buttner, Juanita Laster   Wed Jan 11, 2021 11:55 AM)    potassium chloride SA (KLOR-CON M) 20 MEQ tablet [id number] Yes Take 2 tablets (40 mEq total) by mouth in the morning AND 1 tablet (20 mEq total) every evening. With additional 60 on Metolazone days. [name], FNP Taking Active   sennosides-docusate sodium (SENOKOT-S) 8.6-50 MG tablet [id number]  Yes Take 1-2 tablets by mouth at bedtime as needed for constipation. [provider] Taking Active Self  sertraline (ZOLOFT) 50 MG tablet [id number] Yes TAKE 1 TABLET EVERY DAY [name], MD Taking Active   tolnaftate (TINACTIN) 1 % cream [id number] Yes Apply 1 application topically daily as needed (foot fungus).  [provider] Taking Active Self  TRUEplus Lancets 33G [place] [id number] Yes TEST BLOOD SUGAR TWICE DAILY AS DIRECTED [name], MD Taking Active             Patient Active Problem List   Diagnosis Date Noted   Altered mental status    Acute on chronic blood loss anemia 04/01/2021    Hypokalemia 04/01/2021   SOB (shortness of breath) 11/14/2020   Postinflammatory pulmonary fibrosis ([place]) 06/08/2020   Lung nodule 06/08/2020   Chronic kidney disease, stage 2 (mild) 01/05/2020   Hypertensive chronic kidney disease with stage 1 through stage 4 chronic kidney disease, or unspecified chronic kidney disease 01/05/2020   Snoring 01/05/2020   Neuroleptic-induced parkinsonism ([place]) 09/28/2019   Class 3 severe obesity due to excess calories with serious comorbidity and body mass index (BMI) of 40.0 to 44.9 in adult ([place]) 05/22/2019   Pulmonary hypertension, unspecified ([place]) 04/06/2019   Syncope 02/24/2019   Rheumatoid arthritis ([place]) 01/21/2019   Radial styloid tenosynovitis 11/24/2018   Type 2 diabetes mellitus with stage 2 chronic kidney disease, with long-term current use of insulin ([place]) 07/21/2018   Chest pain 05/15/2018   Excessive daytime sleepiness 01/30/2018   Sleep-related hypoventilation 01/30/2018   Recurrent depression ([place]) 11/06/2017   OSA on CPAP 10/07/2017   Todd's paralysis ([place])    Seizure ([place]) 06/17/2017   Chronic diastolic CHF (congestive heart failure), NYHA class 2 (HCC)    Chronic respiratory failure with hypoxia (HCC)    Acute lower GI bleeding    Stroke-like symptoms 06/15/2017   Rectal bleeding 06/15/2017   Dehydration 03/07/2017   Medication management 12/24/2016   Near syncope 03/09/2016   Racing heart beat 03/09/2016   Dyspnea 01/28/2016   Spinal stenosis at L4-L5 level 10/04/2014   Parkinsonism 06/24/2013   Lower abdominal pain 10/31/2012   DM2 (diabetes mellitus, type 2) ([place]) 10/31/2012   Seizure disorder ([place]) 10/31/2012   Diverticulosis 10/31/2012   Anemia associated with acute blood loss 08/14/2012   Hematochezia 08/14/2012   Varicose veins of bilateral lower extremities with other complications 42/59/5638   Leg pain 03/21/2012   Radicular pain of left lower extremity 03/21/2012   Heart murmur 11/21/2011   Infected  pseudocyst of pancreas 10/28/2011   Hypertensive heart disease with chronic diastolic congestive heart failure ([place]) 10/28/2011   Dyslipidemia, goal LDL below 100 10/28/2011   Morbid obesity with BMI of 45.0-49.9, adult ([place]) 10/28/2011   Pseudoseizure 10/24/2011   Gastroesophageal reflux 10/23/2011   History of tobacco abuse 10/23/2011   Hypoxemia 10/23/2011   Disturbance in sleep behavior 09/03/2011   Partial epilepsy with impairment of consciousness ([place]) 09/03/2011   Tremor 09/03/2011   Peripheral edema 04/14/2011   Obesity hypoventilation syndrome ([place]) 10/26/2010   Atherosclerotic heart disease of native coronary artery with other forms of angina pectoris ([place]) 08/28/2010   Acute, but ill-defined, cerebrovascular disease 05/04/2008   Arthropathy 05/04/2008   Disequilibrium 05/04/2008   Generalized tonic clonic epilepsy ([place]) 05/04/2008   Increased frequency of urination 05/04/2008   Insomnia, unspecified 05/04/2008   Major depressive disorder, single episode, unspecified 05/04/2008   Memory loss 05/04/2008   Nausea 05/04/2008   Other malaise  and fatigue 05/04/2008   Swelling of limb 05/04/2008    Immunization History  Administered Date(s) Administered   Fluad Quad(high Dose 65+) 06/06/2020, 03/27/2021, 03/15/2022   Influenza Split 04/11/2011, 06/30/2013   Influenza, High Dose Seasonal PF 02/14/2017, 04/24/2018   Influenza-Unspecified 04/07/2014   PFIZER(Purple Top)SARS-COV-2 Vaccination 08/06/2019, 08/31/2019, 05/04/2020, 11/08/2020   PNEUMOCOCCAL CONJUGATE-20 11/08/2020   Pfizer Covid-19 Vaccine Bivalent Booster 97yrs & up 07/07/2021   Pneumococcal Polysaccharide-23 06/25/2010   Tdap 05/18/2012   Zoster Recombinat (Shingrix) 07/07/2021, 12/12/2021   Zoster, Live 04/14/2014    Conditions to be addressed/monitored:  Hypertension and Diabetes  Care Plan : [place]  Updates made by Mayford Knife, [place] since 04/04/2022 12:00 AM     Problem: HTN, DM  II      Long-Range Goal: Disease Management   Note:   Current Barriers:  Unable to independently monitor therapeutic efficacy  Pharmacist Clinical Goal(s):  Patient will achieve adherence to monitoring guidelines and medication adherence to achieve therapeutic efficacy through collaboration with PharmD and provider.   Interventions: 1:1 collaboration with [name], MD regarding development and update of comprehensive plan of care as evidenced by provider attestation and co-signature Inter-disciplinary care team collaboration (see longitudinal plan of care) Comprehensive medication review performed; medication list updated in electronic medical record  Hypertension (BP goal <130/80) -Controlled -Current treatment: Hydralazine 100 mg tablet by mouth three times per day Appropriate, Effective, Safe, Accessible Carvedilol 12.5 mg tablet two times daily Appropriate, Effective, Safe, Accessible Isosorbide mononitrate 30 mg tablet daily Appropriate, Effective, Safe, Accessible -Current home readings: 132/62 pulse -68, 116/61 pulse - 64, 112/57 - pulse 66, 121/62 pulse -64, 124/64 pulse - 63  -Current dietary habits: please see diabetes  -Current exercise habits: please see diabetes  -Denies hypotensive/hypertensive symptoms -Educated on BP goals and benefits of medications for prevention of heart attack, stroke and kidney damage; Importance of home blood pressure monitoring; Proper BP monitoring technique; Symptoms of hypotension and importance of maintaining adequate hydration; -Patient reports that her last  -Counseled to monitor BP at home at least three times per week, document, and provide log at future appointments -Recommended to continue current medication  Diabetes (A1c goal <7%) -Controlled -Current medications: Insulin glargine - inject 25 units daily, currently injecting 20 units daily  Appropriate, Effective, Safe, Accessible Insulin lispro -Inject 0.02-0.04 mLs (2-4  Units total) into the skin 3 (three) times daily as needed (high blood sugar over 150 Appropriate, Effective, Safe, Accessible Ozempic 1 mg once per week Appropriate, Effective, Safe, Accessible -Current home glucose readings fasting glucose:  145, 136, 130, 119, 90 -Reports hypoglycemic/hyperglycemic symptoms -Current meal patterns:  lunch: ravioli and cheesy garlic bread, with a pork chop   dinner: chicken drummettes, corn on the cob and tossed salad  drinks: drinking some water  -Current exercise: she does chair exercises, and she walks outside  her house -Educated on A1c and blood sugar goals; Prevention and management of hypoglycemic episodes; Benefits of routine self-monitoring of blood sugar; Carbohydrate counting and/or plate method -Counseled to check feet daily and get yearly eye exams  -Patient reports exam was switched with sister because there was something wrong with her eyes, so her appointment was moved to January, I asked if she was comfortable with moving her exam to an earlier date.   -Patient reported that she would like help with this  -Recommended to continue current medication  Patient Goals/Self-Care Activities Patient will:  - take medications as prescribed as evidenced by patient  report and record review  Follow Up Plan: The patient has been provided with contact information for the care management team and has been advised to call with any health related questions or concerns.       Medication Assistance: None required.  Patient affirms current coverage meets needs.  Compliance/Adherence/Medication fill history: Care Gaps: COVID-19 Vaccine  OPHTHALMOLOGY EXAM  Star-Rating Drugs: Atorvastatin 10 mg tablet  Ozempic 1 mg   Patient's preferred pharmacy is:  [facility] [address], [place] [address] Phone: [phone] Fax: [phone]  [facility] [address], [name] [address] Phone: [phone] Fax: [phone]  Uses pill box? Yes Pt endorses 90% compliance  We discussed: Benefits of medication synchronization, packaging and delivery as well as enhanced pharmacist oversight with [facility]. Patient decided to: Continue current medication management strategy  Care Plan and Follow Up Patient Decision:  Patient agrees to Care Plan and Follow-up.  Plan: The patient has been provided with contact information for the care management team and has been advised to call with any health related questions or concerns.  [name], CPP, PharmD Clinical Pharmacist Practitioner [facility] [phone]

## 2022-03-28 NOTE — Telephone Encounter (Signed)
  Care Management   Follow Up Note   03/28/2022 Name: [name] MRN: [id number] DOB: 1946/03/27   Referred by: [name], MD Reason for referral : No chief complaint on file.   An unsuccessful telephone outreach was attempted today. The patient was referred to the case management team for assistance with care management and care coordination.   Follow Up Plan: The patient has been provided with contact information for the care management team and has been advised to call with any health related questions or concerns.   [name], CPP, PharmD Clinical Pharmacist Practitioner [facility] [phone]

## 2022-03-28 NOTE — Chronic Care Management (AMB) (Signed)
Care Gap(s) Not Met that Need to be Addressed:   Eye Exam for Patients With Diabetes   Action Taken: Contacted Dr. Patrici Ranks office and exam that was scheduled in August was canceled by patient. The next available appointment was 06-27-2021 at 1:45. Appointment been scheduled, and no sooner day is available. Patient is aware   Follow Up: follow up after exam  [place] Pharmacist Assistant [phone]

## 2022-04-04 NOTE — Patient Instructions (Signed)
Visit Information It was great speaking with you today!  Please let me know if you have any questions about our visit.   Goals Addressed             This Visit's Progress    Manage My Medicine       Timeframe:  Long-Range Goal Priority:  High Start Date:                             Expected End Date:                       Follow Up Date 06/26/2022   In Progress:  - call for medicine refill 2 or 3 days before it runs out - call if I am sick and can't take my medicine - keep a list of all the medicines I take; vitamins and herbals too - learn to read medicine labels - use an alarm clock or phone to remind me to take my medicine    Why is this important?   These steps will help you keep on track with your medicines.   Notes:  Please call if you have any questions about your medications.        Patient Care Plan: CCM Pharmacy Care Plan     Problem Identified: HTN, DM II      Long-Range Goal: Disease Management   Note:   Current Barriers:  Unable to independently monitor therapeutic efficacy  Pharmacist Clinical Goal(s):  Patient will achieve adherence to monitoring guidelines and medication adherence to achieve therapeutic efficacy through collaboration with PharmD and provider.   Interventions: 1:1 collaboration with [name], MD regarding development and update of comprehensive plan of care as evidenced by provider attestation and co-signature Inter-disciplinary care team collaboration (see longitudinal plan of care) Comprehensive medication review performed; medication list updated in electronic medical record  Hypertension (BP goal <130/80) -Controlled -Current treatment: Hydralazine 100 mg tablet by mouth three times per day Appropriate, Effective, Safe, Accessible Carvedilol 12.5 mg tablet two times daily Appropriate, Effective, Safe, Accessible Isosorbide mononitrate 30 mg tablet daily Appropriate, Effective, Safe, Accessible -Current home  readings: 132/62 pulse -68, 116/61 pulse - 64, 112/57 - pulse 66, 121/62 pulse -64, 124/64 pulse - 63  -Current dietary habits: please see diabetes  -Current exercise habits: please see diabetes  -Denies hypotensive/hypertensive symptoms -Educated on BP goals and benefits of medications for prevention of heart attack, stroke and kidney damage; Importance of home blood pressure monitoring; Proper BP monitoring technique; Symptoms of hypotension and importance of maintaining adequate hydration; -Patient reports that her last  -Counseled to monitor BP at home at least three times per week, document, and provide log at future appointments -Recommended to continue current medication  Diabetes (A1c goal <7%) -Controlled -Current medications: Insulin glargine - inject 25 units daily, currently injecting 20 units daily  Appropriate, Effective, Safe, Accessible Insulin lispro -Inject 0.02-0.04 mLs (2-4 Units total) into the skin 3 (three) times daily as needed (high blood sugar over 150 Appropriate, Effective, Safe, Accessible Ozempic 1 mg once per week Appropriate, Effective, Safe, Accessible -Current home glucose readings fasting glucose:  145, 136, 130, 119, 90 -Reports hypoglycemic/hyperglycemic symptoms -Current meal patterns:  lunch: ravioli and cheesy garlic bread, with a pork chop   dinner: chicken drummettes, corn on the cob and tossed salad  drinks: drinking some water  -Current exercise: she does chair exercises, and she walks outside  her house -Educated on A1c and blood sugar goals; Prevention and management of hypoglycemic episodes; Benefits of routine self-monitoring of blood sugar; Carbohydrate counting and/or plate method -Counseled to check feet daily and get yearly eye exams  -Patient reports exam was switched with sister because there was something wrong with her eyes, so her appointment was moved to January, I asked if she was comfortable with moving her exam to an earlier  date.   -Patient reported that she would like help with this  -Recommended to continue current medication  Patient Goals/Self-Care Activities Patient will:  - take medications as prescribed as evidenced by patient report and record review  Follow Up Plan: The patient has been provided with contact information for the care management team and has been advised to call with any health related questions or concerns.       Patient agreed to services and verbal consent obtained.   The patient verbalized understanding of instructions, educational materials, and care plan provided today and agreed to receive a mailed copy of patient instructions, educational materials, and care plan.   [name], PharmD Clinical Pharmacist [facility] [phone]

## 2022-04-05 ENCOUNTER — Telehealth: Payer: Self-pay | Admitting: Cardiology

## 2022-04-05 ENCOUNTER — Telehealth: Payer: Self-pay | Admitting: Pulmonary Disease

## 2022-04-05 DIAGNOSIS — G4733 Obstructive sleep apnea (adult) (pediatric): Secondary | ICD-10-CM

## 2022-04-05 NOTE — Telephone Encounter (Signed)
  Pt said, she changed her insurance and per her insurance Dr. Ellyn Hack needs to send a referral to Dr. [name] so insurance will able to process her visit with Dr. [name]

## 2022-04-05 NOTE — Telephone Encounter (Signed)
RN called patient . Patient states she received information from her insurance ( [employer])  she would need an updated /or new referral from Dr Ellyn Hack for her to to continue to see Dr [name].( [facility])   RN informed patient not sure of the next step in this process . Will needed contact [facility]  precert/referral dept. On the protocol.   RN informed patient will contact her with the response.

## 2022-04-05 NOTE — Telephone Encounter (Signed)
Called and spoke with patient. She wanted to know if she could have a RX sent to [facility] for a travel cpap machine. I advised her that [facility] does have provide travel machines. I explained to her that most patients end up using [employer].com or [employer]. She verbalized understanding. She will speak with her son about this.   In the meantime, she wanted to know if Dr. Vaughan Browner would be ok with writing a prescription for the cpap machine.   Dr. Vaughan Browner, can you please advise? Thanks!

## 2022-04-06 NOTE — Telephone Encounter (Signed)
Rx for cpap has been printed for pt to come by office to pick up. Called and spoke with pt letting her know this was ready for her to come by to pick up and she verbalized understanding. Nothing further needed.

## 2022-04-06 NOTE — Telephone Encounter (Signed)
Ok to write a prescription for CPAP

## 2022-04-20 ENCOUNTER — Encounter: Payer: Self-pay | Admitting: Neurology

## 2022-04-20 ENCOUNTER — Ambulatory Visit: Payer: Medicare HMO | Admitting: Neurology

## 2022-04-20 VITALS — BP 145/71 | HR 78 | Resp 18 | Ht 61.0 in | Wt 178.0 lb

## 2022-04-20 DIAGNOSIS — G40009 Localization-related (focal) (partial) idiopathic epilepsy and epileptic syndromes with seizures of localized onset, not intractable, without status epilepticus: Secondary | ICD-10-CM | POA: Diagnosis not present

## 2022-04-20 MED ORDER — LAMOTRIGINE 150 MG PO TABS: 150.0000 mg | ORAL_TABLET | Freq: Two times a day (BID) | ORAL | 3 refills | Status: DC

## 2022-04-20 MED ORDER — LEVETIRACETAM 500 MG PO TABS
ORAL_TABLET | ORAL | 3 refills | Status: DC
Start: 2022-04-20 — End: 2023-02-27
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
## 2022-04-23 ENCOUNTER — Ambulatory Visit (INDEPENDENT_AMBULATORY_CARE_PROVIDER_SITE_OTHER): Payer: Medicare HMO | Admitting: Internal Medicine

## 2022-04-23 ENCOUNTER — Encounter: Payer: Self-pay | Admitting: Internal Medicine

## 2022-04-23 VITALS — BP 120/70 | HR 65 | Temp 98.5°F | Ht 61.0 in | Wt 177.0 lb

## 2022-04-23 DIAGNOSIS — Z7189 Other specified counseling: Secondary | ICD-10-CM

## 2022-04-23 DIAGNOSIS — Z6833 Body mass index (BMI) 33.0-33.9, adult: Secondary | ICD-10-CM | POA: Diagnosis not present

## 2022-04-23 DIAGNOSIS — H6122 Impacted cerumen, left ear: Secondary | ICD-10-CM | POA: Diagnosis not present

## 2022-04-23 DIAGNOSIS — M5441 Lumbago with sciatica, right side: Secondary | ICD-10-CM

## 2022-04-23 DIAGNOSIS — R053 Chronic cough: Secondary | ICD-10-CM | POA: Diagnosis not present

## 2022-04-23 DIAGNOSIS — E6609 Other obesity due to excess calories: Secondary | ICD-10-CM | POA: Diagnosis not present

## 2022-04-23 DIAGNOSIS — G8929 Other chronic pain: Secondary | ICD-10-CM | POA: Insufficient documentation

## 2022-04-23 MED ORDER — BENZONATATE 100 MG PO CAPS: 100.0000 mg | ORAL_CAPSULE | Freq: Three times a day (TID) | ORAL | 0 refills | Status: DC | PRN

## 2022-04-23 MED ORDER — DEXLANSOPRAZOLE 60 MG PO CPDR: 1.0000 | DELAYED_RELEASE_CAPSULE | Freq: Every day | ORAL | 2 refills | Status: DC

## 2022-04-23 NOTE — Progress Notes (Signed)
Rich Brave [place]ittleton,acting as a Education administrator for [name], MD.,have documented all relevant documentation on the behalf of [name], MD,as directed by  [name], MD while in the presence of [name], MD.    Subjective:     Patient ID: [name] , female    DOB: 01-Aug-1945 , 76 y.o.   MRN: [id number]   Chief Complaint  Patient presents with   ear clogged    HPI  Patient presents today to have ears cleaned out. She reports she cant hear out of her left ear.     Past Medical History:  Diagnosis Date   Abnormal liver function     in the past.   Anemia    Arthritis    all over   Bruises easily    Cataract    right eye;immature   CHF (congestive heart failure) (HCC)    Chronic back pain    stenosis   Chronic cough    Chronic kidney disease    COPD (chronic obstructive pulmonary disease) ([place])    COVID    aug/sept 2021   Demand myocardial infarction [facility]) 2012   Demand Infarction in setting of Pancreatitis --> mild Troponin elevation, NON-OBSTRUCTIVE CAD   Depression    takes Abilify daily as well as Zoloft   Diastolic heart failure 3893   Grade 1 diastolic Dysfunction by Echo    Diverticulosis    DM (diabetes mellitus) ([place])    takes Victoza daily as well as Lantus and Humalog   Empty sella ([place])    on MRI in 2009.   Glaucoma    Headache(784.0)    last migraine-4-15yr ago   History of blood transfusion    no abnormal reaction noted   History of colon polyps    benign   HTN (hypertension)    takes Benicar,Imdur,and Bystolic daily   Hyperlipidemia    takes Lipitor daily   Joint swelling    Nocturia    Obesity hypoventilation syndrome ([place]    Obstructive sleep apnea 02/2018   Notably improved split-night study with weight loss from 270 (2017) down to 250 pounds (2019).   Pancreatitis    takes Pancrelipase daily   Parkinson's disease    takes Sinemet daily   Peripheral neuropathy    Pneumonia 2012   Seizures ([place]    takes  Lamictal daily and Primidone nightly;last seizure 2wks ago   Urinary urgency    With increased frequency   Varicose veins of both lower extremities with pain    With edema.  Takes daily Lasix     Family History  Problem Relation Age of Onset   Allergies Father    Heart disease Father        before 64  Heart failure Father    Hypertension Father    Hyperlipidemia Father    Heart disease Mother    Diabetes Mother    Hypertension Mother    Hyperlipidemia Mother    Hypertension Sister    Heart disease Sister        before 645  Hyperlipidemia Sister    Diabetes Brother    Hypertension Brother    Diabetes Sister    Hypertension Sister    Diabetes Son    Hypertension Son    Cancer Other      Current Outpatient Medications:    acetaminophen (TYLENOL) 500 MG tablet, Take 500-1,000 mg by mouth every 4 (four) hours as needed for moderate  pain. , Disp: , Rfl:    albuterol (VENTOLIN HFA) 108 (90 Base) MCG/ACT inhaler, Inhale 1-2 puffs into the lungs every 6 (six) hours as needed for wheezing or shortness of breath., Disp: 18 g, Rfl: 5   Alcohol Swabs (DROPSAFE ALCOHOL PREP) 70 % PADS, USE TWO TIMES DAILY AS DIRECTED, Disp: 200 each, Rfl: 6   Ascorbic Acid (VITAMIN C) 1000 MG tablet, Take 1,000 mg by mouth daily., Disp: , Rfl:    atorvastatin (LIPITOR) 10 MG tablet, TAKE 1 TABLET EVERY EVENING, Disp: 90 tablet, Rfl: 2   B-D ULTRAFINE III SHORT PEN 31G X 8 MM MISC, USE DAILY WITH VICTOZA AND/OR NOVOLOG., Disp: 400 each, Rfl: 2   Blood Glucose Calibration (TRUE METRIX LEVEL 1) Low SOLN, Use as directed  Dx:e11.22, Disp: 1 each, Rfl: 2   Blood Glucose Monitoring Suppl (TRUE METRIX METER) w/Device KIT, USE AS DIRECTED, Disp: 1 kit, Rfl: 1   carvedilol (COREG) 12.5 MG tablet, Take 1 tablet (12.5 mg total) by mouth 2 (two) times daily., Disp: 180 tablet, Rfl: 3   diclofenac sodium (VOLTAREN) 1 % GEL, Apply 2 g topically 4 (four) times daily as needed (pain)., Disp: , Rfl:    ferrous sulfate  324 MG TBEC, Patient takes 1 tablet by mouth every other day., Disp: , Rfl:    furosemide (LASIX) 20 MG tablet, TAKE 4 TABLETS (80 MG) BY MOUTH IN THE MORNING AND 3 TABLETS (60 MG) EVERY EVENING., Disp: 210 tablet, Rfl: 6   Glucagon (GVOKE HYPOPEN 1-PACK) 0.5 MG/0.1ML SOAJ, Inject 0.5 mg into the skin daily as needed., Disp: 0.1 mL, Rfl: 11   glucose blood (TRUE METRIX BLOOD GLUCOSE TEST) test strip, USE AS DIRECTED TO CHECK BLOOD SUGARS 2 TIMES PER DAY, Disp: 200 strip, Rfl: 2   hydrALAZINE (APRESOLINE) 100 MG tablet, Take 1 tablet (100 mg total) by mouth 3 (three) times daily., Disp: 270 tablet, Rfl: 3   hydrocortisone cream 1 %, Apply 1 application topically daily as needed for itching. , Disp: , Rfl:    insulin glargine, 1 Unit Dial, (TOUJEO SOLOSTAR) 300 UNIT/ML Solostar Pen, Inject 25 Units into the skin daily., Disp: 4.5 mL, Rfl: 1   insulin lispro (HUMALOG KWIKPEN) 100 UNIT/ML KwikPen, Inject 0.02-0.04 mLs (2-4 Units total) into the skin 3 (three) times daily as needed (high blood sugar over 150)., Disp: 15 mL, Rfl: 2   Isopropyl Alcohol (ALCOHOL WIPES) 70 % MISC, Apply 1 each topically 2 (two) times daily., Disp: 300 each, Rfl: 3   isosorbide mononitrate (IMDUR) 30 MG 24 hr tablet, TAKE 1 TABLET EVERY DAY, Disp: 90 tablet, Rfl: 2   lamoTRIgine (LAMICTAL) 150 MG tablet, Take 1 tablet (150 mg total) by mouth 2 (two) times daily., Disp: 180 tablet, Rfl: 3   levETIRAcetam (KEPPRA) 500 MG tablet, Take 2 tablets every night, Disp: 180 tablet, Rfl: 3   ondansetron (ZOFRAN) 4 MG tablet, Take 1 tablet (4 mg total) by mouth every 8 (eight) hours as needed for nausea or vomiting., Disp: 20 tablet, Rfl: 1   OZEMPIC, 1 MG/DOSE, 4 MG/3ML SOPN, INJECT 1 MG INTO THE SKIN ONCE A WEEK., Disp: 9 mL, Rfl: 3   Pancrelipase, Lip-Prot-Amyl, 25000-79000 units CPEP, Take 1-2 capsules by mouth See admin instructions. Take 1 capsules in the morning with breakfast, 1 capsule with lunch, and 2 capsules with supper, Disp:  , Rfl:    potassium chloride SA (KLOR-CON M) 20 MEQ tablet, Take 2 tablets (40 mEq total) by mouth in  the morning AND 1 tablet (20 mEq total) every evening. With additional 60 on Metolazone days., Disp: 300 tablet, Rfl: 2   sennosides-docusate sodium (SENOKOT-S) 8.6-50 MG tablet, Take 1-2 tablets by mouth at bedtime as needed for constipation., Disp: , Rfl:    sertraline (ZOLOFT) 50 MG tablet, TAKE 1 TABLET EVERY DAY, Disp: 90 tablet, Rfl: 2   tolnaftate (TINACTIN) 1 % cream, Apply 1 application topically daily as needed (foot fungus). , Disp: , Rfl:    TRUEplus Lancets 33G MISC, TEST BLOOD SUGAR TWICE DAILY AS DIRECTED, Disp: 200 each, Rfl: 3   benzonatate (TESSALON) 100 MG capsule, Take 1 capsule (100 mg total) by mouth 3 (three) times daily as needed for cough., Disp: 30 capsule, Rfl: 0   dexlansoprazole (DEXILANT) 60 MG capsule, Take 1 capsule (60 mg total) by mouth daily., Disp: 90 capsule, Rfl: 2   guaiFENesin-dextromethorphan (ROBITUSSIN DM) 100-10 MG/5ML syrup, Take 10 mLs by mouth every 6 (six) hours as needed for cough. (Patient not taking: Reported on 03/15/2022), Disp: 118 mL, Rfl: 0   Allergies  Allergen Reactions   Other Anaphylaxis and Rash    Bleach   Penicillins Hives    Did it involve swelling of the face/tongue/throat, SOB, or low BP? No Did it involve sudden or severe rash/hives, skin peeling, or any reaction on the inside of your mouth or nose? Yes Did you need to seek medical attention at a hospital or doctor's office? Yes When did it last happen?      Childhood allergy  If all above answers are "NO", may proceed with cephalosporin use.     Sulfa Antibiotics Hives   Aspirin Other (See Comments)     On aspirin 81 mg - Rectal bleeding in dec 2018   Codeine     Headache and makes the patient feel "off"     Review of Systems  Constitutional: Negative.   HENT:  Positive for hearing loss.        Wants to have ear flushed, she has noticed decreased hearing in her left  ear  Respiratory:  Positive for cough.   Cardiovascular: Negative.   Gastrointestinal: Negative.   Musculoskeletal:  Positive for back pain.  Neurological: Negative.   Psychiatric/Behavioral: Negative.       Today's Vitals   04/23/22 1441  BP: 120/70  Pulse: 65  Temp: 98.5 F (36.9 C)  Weight: 177 lb (80.3 kg)  Height: _0  (1.549 m)  PainSc: 0-No pain   Body mass index is 33.44 kg/m.  Wt Readings from Last 3 Encounters:  04/23/22 177 lb (80.3 kg)  04/20/22 178 lb (80.7 kg)  03/20/22 187 lb (84.8 kg)     Objective:  Physical Exam Vitals and nursing note reviewed.  Constitutional:      Appearance: Normal appearance.  HENT:     Head: Normocephalic and atraumatic.     Right Ear: Tympanic membrane, ear canal and external ear normal. There is no impacted cerumen.     Left Ear: Ear canal and external ear normal. There is impacted cerumen.     Nose:     Comments: Masked     Mouth/Throat:     Comments: Masked  Eyes:     Extraocular Movements: Extraocular movements intact.  Cardiovascular:     Rate and Rhythm: Normal rate and regular rhythm.     Heart sounds: Normal heart sounds.  Pulmonary:     Effort: Pulmonary effort is normal.     Breath sounds: Normal  breath sounds.  Skin:    General: Skin is warm.  Neurological:     General: No focal deficit present.     Mental Status: She is alert.  Psychiatric:        Mood and Affect: Mood normal.        Behavior: Behavior normal.      Assessment And Plan:     1. Left ear impacted cerumen Comments: After obtaining verbal consent, left ear was flushed by irrigation w/o complication. No TM abnormalities were noted.  - Ear Lavage  2. Chronic cough Comments: Sx are suggestive of postnasal drip. She will c/w Tessalon perles prn. She may benefit from loratadine if her sx persist.  - benzonatate (TESSALON) 100 MG capsule; Take 1 capsule (100 mg total) by mouth 3 (three) times daily as needed for cough.  Dispense: 30 capsule;  Refill: 0  3. Chronic right-sided low back pain with right-sided sciatica Comments: Intermittent in nature. She would likely benefit from regular stretching.   4. Counseling regarding end of life decision making Comments: MOST form updated/completed.  She wishes to have CPR, no intubation. Ok w/ abx, iv fluids.   5. Class 1 obesity due to excess calories with serious comorbidity and body mass index (BMI) of 33.0 to 33.9 in adult Comments: She was congratulated on her 10lb weight loss since Sept 2023, she is encouraged to keep up the great work!   Patient was given opportunity to ask questions. Patient verbalized understanding of the plan and was able to repeat key elements of the plan. All questions were answered to their satisfaction.   I, [name], MD, have reviewed all documentation for this visit. The documentation on 04/23/22 for the exam, diagnosis, procedures, and orders are all accurate and complete.   IF YOU HAVE BEEN REFERRED TO A SPECIALIST, IT MAY TAKE 1-2 WEEKS TO SCHEDULE/PROCESS THE REFERRAL. IF YOU HAVE NOT HEARD FROM US/SPECIALIST IN TWO WEEKS, PLEASE GIVE [place] A CALL AT [phone].   THE PATIENT IS ENCOURAGED TO PRACTICE SOCIAL DISTANCING DUE TO THE COVID-19 PANDEMIC.

## 2022-04-24 ENCOUNTER — Other Ambulatory Visit: Payer: Self-pay

## 2022-04-24 DIAGNOSIS — Z794 Long term (current) use of insulin: Secondary | ICD-10-CM

## 2022-04-24 DIAGNOSIS — I10 Essential (primary) hypertension: Secondary | ICD-10-CM | POA: Diagnosis not present

## 2022-04-24 DIAGNOSIS — E1159 Type 2 diabetes mellitus with other circulatory complications: Secondary | ICD-10-CM | POA: Diagnosis not present

## 2022-04-24 MED ORDER — TIZANIDINE HCL 2 MG PO TABS: ORAL_TABLET | ORAL | 2 refills | Status: DC

## 2022-04-25 ENCOUNTER — Other Ambulatory Visit: Payer: Self-pay | Admitting: Internal Medicine

## 2022-05-07 ENCOUNTER — Ambulatory Visit: Payer: Medicare HMO | Admitting: Pulmonary Disease

## 2022-05-07 ENCOUNTER — Encounter: Payer: Self-pay | Admitting: Pulmonary Disease

## 2022-05-07 ENCOUNTER — Other Ambulatory Visit: Payer: Self-pay | Admitting: Internal Medicine

## 2022-05-07 VITALS — BP 100/60 | HR 74 | Ht 61.0 in | Wt 180.0 lb

## 2022-05-07 DIAGNOSIS — R0609 Other forms of dyspnea: Secondary | ICD-10-CM | POA: Diagnosis not present

## 2022-05-07 DIAGNOSIS — G4733 Obstructive sleep apnea (adult) (pediatric): Secondary | ICD-10-CM | POA: Diagnosis not present

## 2022-05-07 DIAGNOSIS — E1122 Type 2 diabetes mellitus with diabetic chronic kidney disease: Secondary | ICD-10-CM

## 2022-05-07 DIAGNOSIS — J8410 Pulmonary fibrosis, unspecified: Secondary | ICD-10-CM

## 2022-05-07 NOTE — Progress Notes (Signed)
[name]    [id number]    April 19, 1946  Primary Care Physician:Sanders, Bailey Mech, MD  Referring Physician: [name], [address] [place],  [place] [id number]  Chief complaint: Follow-up for post COVID-19  HPI: 76 y.o.  with OSA.  Developed COVID-19 at the end of August.  Treated with monoclonal antibody on 02/23/2020. She was treated at home with remote health with dexamethasone, azithromycin, supplemental oxygen. She had a rough first week but feels that she is slowly improving.  Continues to have cough with white mucus  Supplemental oxygen stopped in December 2021 due to continued improvement  Hospitalized from 5/22 to 11/18/2020 for acute respiratory failure secondary to bronchitis. She had a CT of the chest which showed chronic changes and no acute infiltrate.  She was treated with antibiotics, steroids.  Ultimately treated for heart failure with Lasix.  Pets: Has dogs, no cats, birds, farm animals Occupation: Retired Psychologist, counselling Exposures: No known exposures.  No mold, hot tub, Jacuzzi Smoking history: 13-pack-year smoker.  Quit in 1990 Travel history: Previously lived in [place].  No significant recent travel Relevant family history: No significant family history of lung disease  Interim history: She is taken off Breo inhaler at last visit.  She states that there is no change in her breathing Continues to have chronic cough for which she was given Tessalon by primary care.  This is helping with the symptoms  She was evaluated in the ED in July 2023 for unresponsiveness, code stroke.  MRI, CT head and CTA of head and neck were unremarkable.  There is no evidence of seizures on EEG.  Chest x-ray during that admission showed mild cardiomegaly with no acute lung abnormalities.  I have reviewed the ED notes and imaging personally.  Outpatient Encounter Medications as of 05/07/2022  Medication Sig   albuterol (VENTOLIN HFA) 108 (90 Base)  MCG/ACT inhaler Inhale 1-2 puffs into the lungs every 6 (six) hours as needed for wheezing or shortness of breath.   acetaminophen (TYLENOL) 500 MG tablet Take 500-1,000 mg by mouth every 4 (four) hours as needed for moderate pain.    Alcohol Swabs (DROPSAFE ALCOHOL PREP) 70 % PADS USE TWO TIMES DAILY AS DIRECTED   Ascorbic Acid (VITAMIN C) 1000 MG tablet Take 1,000 mg by mouth daily.   atorvastatin (LIPITOR) 10 MG tablet TAKE 1 TABLET EVERY EVENING   B-D ULTRAFINE III SHORT PEN 31G X 8 MM MISC USE DAILY WITH VICTOZA AND/OR NOVOLOG.   benzonatate (TESSALON) 100 MG capsule Take 1 capsule (100 mg total) by mouth 3 (three) times daily as needed for cough.   Blood Glucose Calibration (TRUE METRIX LEVEL 1) Low SOLN Use as directed  Dx:e11.22   Blood Glucose Monitoring Suppl (TRUE METRIX METER) w/Device KIT USE AS DIRECTED   carvedilol (COREG) 12.5 MG tablet Take 1 tablet (12.5 mg total) by mouth 2 (two) times daily.   dexlansoprazole (DEXILANT) 60 MG capsule Take 1 capsule (60 mg total) by mouth daily.   diclofenac sodium (VOLTAREN) 1 % GEL Apply 2 g topically 4 (four) times daily as needed (pain).   ferrous sulfate 324 MG TBEC Patient takes 1 tablet by mouth every other day.   furosemide (LASIX) 20 MG tablet TAKE 4 TABLETS (80 MG) BY MOUTH IN THE MORNING AND 3 TABLETS (60 MG) EVERY EVENING.   Glucagon (GVOKE HYPOPEN 1-PACK) 0.5 MG/0.1ML SOAJ Inject 0.5 mg into the skin daily as needed.  guaiFENesin-dextromethorphan (ROBITUSSIN DM) 100-10 MG/5ML syrup Take 10 mLs by mouth every 6 (six) hours as needed for cough. (Patient not taking: Reported on 03/15/2022)   hydrALAZINE (APRESOLINE) 100 MG tablet Take 1 tablet (100 mg total) by mouth 3 (three) times daily.   hydrocortisone cream 1 % Apply 1 application topically daily as needed for itching.    insulin glargine, 1 Unit Dial, (TOUJEO SOLOSTAR) 300 UNIT/ML Solostar Pen Inject 25 Units into the skin daily.   insulin lispro (HUMALOG KWIKPEN) 100 UNIT/ML  KwikPen Inject 0.02-0.04 mLs (2-4 Units total) into the skin 3 (three) times daily as needed (high blood sugar over 150).   Isopropyl Alcohol (ALCOHOL WIPES) 70 % MISC Apply 1 each topically 2 (two) times daily.   isosorbide mononitrate (IMDUR) 30 MG 24 hr tablet TAKE 1 TABLET EVERY DAY   lamoTRIgine (LAMICTAL) 150 MG tablet Take 1 tablet (150 mg total) by mouth 2 (two) times daily.   levETIRAcetam (KEPPRA) 500 MG tablet Take 2 tablets every night   ondansetron (ZOFRAN) 4 MG tablet Take 1 tablet (4 mg total) by mouth every 8 (eight) hours as needed for nausea or vomiting.   OZEMPIC, 1 MG/DOSE, 4 MG/3ML SOPN INJECT 1 MG INTO THE SKIN ONCE A WEEK.   Pancrelipase, Lip-Prot-Amyl, 25000-79000 units CPEP Take 1-2 capsules by mouth See admin instructions. Take 1 capsules in the morning with breakfast, 1 capsule with lunch, and 2 capsules with supper   potassium chloride SA (KLOR-CON M) 20 MEQ tablet Take 2 tablets (40 mEq total) by mouth in the morning AND 1 tablet (20 mEq total) every evening. With additional 60 on Metolazone days.   sennosides-docusate sodium (SENOKOT-S) 8.6-50 MG tablet Take 1-2 tablets by mouth at bedtime as needed for constipation.   sertraline (ZOLOFT) 50 MG tablet TAKE 1 TABLET EVERY DAY   tiZANidine (ZANAFLEX) 2 MG tablet Take one tablet by mouth at bedtime as needed   tolnaftate (TINACTIN) 1 % cream Apply 1 application topically daily as needed (foot fungus).    TRUE METRIX BLOOD GLUCOSE TEST test strip USE AS DIRECTED TO CHECK BLOOD SUGARS 2 TIMES PER DAY   TRUEplus Lancets 33G MISC TEST BLOOD SUGAR TWICE DAILY AS DIRECTED   No facility-administered encounter medications on file as of 05/07/2022.   Physical Exam: Blood pressure 100/60, pulse 74, height _0  (1.549 m), weight 180 lb (81.6 kg), SpO2 97 %. Gen:      No acute distress HEENT:  EOMI, sclera anicteric Neck:     No masses; no thyromegaly Lungs:    Clear to auscultation bilaterally; normal respiratory effort CV:          Regular rate and rhythm; no murmurs Abd:      + bowel sounds; soft, non-tender; no palpable masses, no distension Ext:    No edema; adequate peripheral perfusion Skin:      Warm and dry; no rash Neuro: alert and oriented x 3 Psych: normal mood and affect   Data Reviewed: Imaging: Chest x-ray 08/01/2019-low lung volumes with bibasal atelectasis, borderline cardiomegaly.  High-res CT 03/29/2020-scattered areas of peripheral groundglass opacity, 8 mm right upper lobe nodule CT chest 11/17/2020-subsegmental atelectasis at the base Chest x-ray 12/28/2021-cardiomegaly, no acute airspace disease. I have reviewed the images personally.  PFTs: 10/26/2019 FVC 1.52 [75%], FEV1 1.29 [83%], F/F 85, TLC 3.77 [81%], DLCO 16.54 [94%] Normal test  Labs: CBC 04/10/2021-WBC 5.3, eos 3%, absolute eosinophil count 159  Assessment:  Asthma Has presumptive asthma as she has recurrent  bronchitis and sensitivity to strong perfumes though there is no obstruction on PFTs Currently off controller medication and is doing well.  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   She is not very compliant with the CPAP and we reviewed risks of untreated sleep apnea today.  She will try to be more compliant  Pulm HTN.  Follows with Dr. [name]  Plan/Recommendations: Observe off breo CPAP for sleep apnea Follow-up in 6 months  Marshell Garfinkel MD  Pulmonary and Critical Care 05/07/2022, 11:52 AM  CC: [name], MD

## 2022-05-07 NOTE — Patient Instructions (Signed)
Glad you are stable with your breathing Please use the CPAP on a regular basis Follow-up in 6 months.

## 2022-05-16 ENCOUNTER — Emergency Department (HOSPITAL_COMMUNITY)
Admission: EM | Admit: 2022-05-16 | Discharge: 2022-05-17 | Disposition: A | Discharge status: Home / Self Care | Payer: Medicare HMO | Attending: Emergency Medicine | Admitting: Emergency Medicine

## 2022-05-16 ENCOUNTER — Other Ambulatory Visit: Payer: Self-pay

## 2022-05-16 DIAGNOSIS — I130 Hypertensive heart and chronic kidney disease with heart failure and stage 1 through stage 4 chronic kidney disease, or unspecified chronic kidney disease: Secondary | ICD-10-CM | POA: Insufficient documentation

## 2022-05-16 DIAGNOSIS — D649 Anemia, unspecified: Secondary | ICD-10-CM | POA: Diagnosis not present

## 2022-05-16 DIAGNOSIS — Z79899 Other long term (current) drug therapy: Secondary | ICD-10-CM | POA: Diagnosis not present

## 2022-05-16 DIAGNOSIS — Z794 Long term (current) use of insulin: Secondary | ICD-10-CM | POA: Diagnosis not present

## 2022-05-16 DIAGNOSIS — R0689 Other abnormalities of breathing: Secondary | ICD-10-CM | POA: Diagnosis not present

## 2022-05-16 DIAGNOSIS — D509 Iron deficiency anemia, unspecified: Secondary | ICD-10-CM | POA: Diagnosis not present

## 2022-05-16 DIAGNOSIS — J449 Chronic obstructive pulmonary disease, unspecified: Secondary | ICD-10-CM | POA: Insufficient documentation

## 2022-05-16 DIAGNOSIS — E876 Hypokalemia: Secondary | ICD-10-CM | POA: Insufficient documentation

## 2022-05-16 DIAGNOSIS — E11649 Type 2 diabetes mellitus with hypoglycemia without coma: Secondary | ICD-10-CM | POA: Diagnosis not present

## 2022-05-16 DIAGNOSIS — E1122 Type 2 diabetes mellitus with diabetic chronic kidney disease: Secondary | ICD-10-CM | POA: Insufficient documentation

## 2022-05-16 DIAGNOSIS — I959 Hypotension, unspecified: Secondary | ICD-10-CM | POA: Diagnosis not present

## 2022-05-16 DIAGNOSIS — R61 Generalized hyperhidrosis: Secondary | ICD-10-CM | POA: Diagnosis not present

## 2022-05-16 DIAGNOSIS — I5030 Unspecified diastolic (congestive) heart failure: Secondary | ICD-10-CM | POA: Diagnosis not present

## 2022-05-16 DIAGNOSIS — E162 Hypoglycemia, unspecified: Secondary | ICD-10-CM

## 2022-05-16 DIAGNOSIS — N189 Chronic kidney disease, unspecified: Secondary | ICD-10-CM | POA: Insufficient documentation

## 2022-05-16 DIAGNOSIS — G20C Parkinsonism, unspecified: Secondary | ICD-10-CM | POA: Diagnosis not present

## 2022-05-16 DIAGNOSIS — Z7951 Long term (current) use of inhaled steroids: Secondary | ICD-10-CM | POA: Diagnosis not present

## 2022-05-16 LAB — CBC WITH DIFFERENTIAL/PLATELET
Abs Immature Granulocytes: 0.02 10*3/uL (ref 0.00–0.07)
Basophils Absolute: 0 10*3/uL (ref 0.0–0.1)
Basophils Relative: 0 %
Eosinophils Absolute: 0.1 10*3/uL (ref 0.0–0.5)
Eosinophils Relative: 1 %
HCT: 29 % — ABNORMAL LOW (ref 36.0–46.0)
Hemoglobin: 8.7 g/dL — ABNORMAL LOW (ref 12.0–15.0)
Immature Granulocytes: 0 %
Lymphocytes Relative: 10 %
Lymphs Abs: 0.9 10*3/uL (ref 0.7–4.0)
MCH: 22.5 pg — ABNORMAL LOW (ref 26.0–34.0)
MCHC: 30 g/dL (ref 30.0–36.0)
MCV: 75.1 fL — ABNORMAL LOW (ref 80.0–100.0)
Monocytes Absolute: 0.6 10*3/uL (ref 0.1–1.0)
Monocytes Relative: 7 %
Neutro Abs: 7.5 10*3/uL (ref 1.7–7.7)
Neutrophils Relative %: 82 %
Platelets: 269 10*3/uL (ref 150–400)
RBC: 3.86 MIL/uL — ABNORMAL LOW (ref 3.87–5.11)
RDW: 16.9 % — ABNORMAL HIGH (ref 11.5–15.5)
WBC: 9.2 10*3/uL (ref 4.0–10.5)
nRBC: 0 % (ref 0.0–0.2)

## 2022-05-16 LAB — CBG MONITORING, ED: Glucose-Capillary: 153 mg/dL — ABNORMAL HIGH (ref 70–99)

## 2022-05-16 NOTE — ED Triage Notes (Signed)
Pt to ED via EMS from home. States pt's blood sugar at home was 59. States she ate a peanut butter and jelly sandwich and drank OJ. Her blood sugar came up to 144. EMS states this happened earlier today as well but pt didn't call EMS. Pt has had 2 glucagon shots today. CBG on arrival to ED is 153. Pt a/ox4  EMS reports  BP 114/76 HR 70

## 2022-05-16 NOTE — ED Provider Notes (Signed)
[facility] DEPT Provider Note   CSN: [id number] Arrival date & time: 05/16/22  2224     History {Add pertinent medical, surgical, social history, OB history to HPI:1} Chief Complaint  Patient presents with   Hypoglycemia    [name] is a 76 y.o. female.  The history is provided by the patient.  Hypoglycemia She has history of hypertension, diabetes, hyperlipidemia, diastolic heart failure, Parkinson's disease, chronic kidney disease, COPD, epilepsy and comes in because of recurrent episodes of hypoglycemia.  Glucose has gone down as low as 59.  She is had to use glucagon twice a day.  In spite of eating, glucose comes up and then comes back down.  She states that she has had decreased appetite today but denies nausea or vomiting.  She denies fever or chills.  She has been sweaty during times that her blood sugar was down.   Home Medications Prior to Admission medications   Medication Sig Start Date End Date Taking? Authorizing Provider  acetaminophen (TYLENOL) 500 MG tablet Take 500-1,000 mg by mouth every 4 (four) hours as needed for moderate pain.     [provider]  albuterol (VENTOLIN HFA) 108 (90 Base) MCG/ACT inhaler Inhale 1-2 puffs into the lungs every 6 (six) hours as needed for wheezing or shortness of breath. 06/08/21   Marshell Garfinkel, MD  Alcohol Swabs (DROPSAFE ALCOHOL PREP) 70 % PADS USE TWO TIMES DAILY AS DIRECTED 09/25/21   [name], MD  Ascorbic Acid (VITAMIN C) 1000 MG tablet Take 1,000 mg by mouth daily.    [provider]  atorvastatin (LIPITOR) 10 MG tablet TAKE 1 TABLET EVERY EVENING 03/01/22   [name], MD  B-D ULTRAFINE III SHORT PEN 31G X 8 MM MISC USE DAILY WITH VICTOZA AND/OR NOVOLOG. 09/25/21   [name], MD  benzonatate (TESSALON) 100 MG capsule Take 1 capsule (100 mg total) by mouth 3 (three) times daily as needed for cough. 04/23/22   [name], MD  Blood Glucose Calibration (TRUE  METRIX LEVEL 1) Low SOLN Use as directed  Dx:e11.22 11/28/20   [name], MD  Blood Glucose Monitoring Suppl (TRUE METRIX METER) w/Device KIT USE AS DIRECTED 05/08/21   [name], MD  carvedilol (COREG) 12.5 MG tablet Take 1 tablet (12.5 mg total) by mouth 2 (two) times daily. 05/30/21 11/02/22  Larey Dresser, MD  dexlansoprazole (DEXILANT) 60 MG capsule Take 1 capsule (60 mg total) by mouth daily. 04/23/22   [name], MD  diclofenac sodium (VOLTAREN) 1 % GEL Apply 2 g topically 4 (four) times daily as needed (pain). 05/17/18   Geradine Girt, DO  ferrous sulfate 324 MG TBEC Patient takes 1 tablet by mouth every other day.    [provider]  furosemide (LASIX) 20 MG tablet TAKE 4 TABLETS (80 MG) BY MOUTH IN THE MORNING AND 3 TABLETS (60 MG) EVERY EVENING. 02/28/22   [place], Maricela Bo, FNP  Glucagon (GVOKE HYPOPEN 1-PACK) 0.5 MG/0.1ML SOAJ Inject 0.5 mg into the skin daily as needed. 03/27/21   [name], MD  guaiFENesin-dextromethorphan [facility] DM) 100-10 MG/5ML syrup Take 10 mLs by mouth every 6 (six) hours as needed for cough. Patient not taking: Reported on 03/15/2022 11/18/20   [place] Donnamarie Poag, DO  hydrALAZINE (APRESOLINE) 100 MG tablet Take 1 tablet (100 mg total) by mouth 3 (three) times daily. 05/04/21   Larey Dresser, MD  hydrocortisone cream 1 % Apply 1 application topically daily as needed for  itching.     [provider]  insulin glargine, 1 Unit Dial, (TOUJEO SOLOSTAR) 300 UNIT/ML Solostar Pen INJECT 36 UNITS INTO THE SKIN AT BEDTIME. 05/07/22   Minette Brine, FNP  insulin lispro (HUMALOG KWIKPEN) 100 UNIT/ML KwikPen Inject 0.02-0.04 mLs (2-4 Units total) into the skin 3 (three) times daily as needed (high blood sugar over 150). 11/23/20   Bary Castilla, NP  Isopropyl Alcohol (ALCOHOL WIPES) 70 % MISC Apply 1 each topically 2 (two) times daily. 06/25/19   Minette Brine, FNP  isosorbide mononitrate (IMDUR) 30 MG 24 hr tablet TAKE 1 TABLET EVERY  DAY 01/01/22   Leonie Man, MD  lamoTRIgine (LAMICTAL) 150 MG tablet Take 1 tablet (150 mg total) by mouth 2 (two) times daily. 04/20/22   Cameron Sprang, MD  levETIRAcetam (KEPPRA) 500 MG tablet Take 2 tablets every night 04/20/22   Cameron Sprang, MD  ondansetron (ZOFRAN) 4 MG tablet Take 1 tablet (4 mg total) by mouth every 8 (eight) hours as needed for nausea or vomiting. 12/20/21   [name], MD  OZEMPIC, 1 MG/DOSE, 4 MG/3ML SOPN INJECT 1 MG INTO THE SKIN ONCE A WEEK. 11/02/21   [name], MD  Pancrelipase, Lip-Prot-Amyl, 25000-79000 units CPEP Take 1-2 capsules by mouth See admin instructions. Take 1 capsules in the morning with breakfast, 1 capsule with lunch, and 2 capsules with supper    [provider]  potassium chloride SA (KLOR-CON M) 20 MEQ tablet Take 2 tablets (40 mEq total) by mouth in the morning AND 1 tablet (20 mEq total) every evening. With additional 60 on Metolazone days. 12/13/21   [place], Maricela Bo, FNP  sennosides-docusate sodium (SENOKOT-S) 8.6-50 MG tablet Take 1-2 tablets by mouth at bedtime as needed for constipation.    [provider]  sertraline (ZOLOFT) 50 MG tablet TAKE 1 TABLET EVERY DAY 09/25/21   [name], MD  tiZANidine (ZANAFLEX) 2 MG tablet Take one tablet by mouth at bedtime as needed 04/24/22   [name], MD  tolnaftate (TINACTIN) 1 % cream Apply 1 application topically daily as needed (foot fungus).     [provider]  TRUE METRIX BLOOD GLUCOSE TEST test strip USE AS DIRECTED TO CHECK BLOOD SUGARS 2 TIMES PER DAY 04/25/22   [name], MD  TRUEplus Lancets 33G MISC TEST BLOOD SUGAR TWICE DAILY AS DIRECTED 11/11/21   [name], MD      Allergies    Other, Penicillins, Sulfa antibiotics, Aspirin, and Codeine    Review of Systems   Review of Systems  All other systems reviewed and are negative.   Physical Exam Updated Vital Signs BP 127/64   Pulse 71   Temp 97.6 F (36.4 C)   Resp 18   Ht  _0  (1.549 m)   Wt 81.6 kg   SpO2 90%   BMI 34.01 kg/m  Physical Exam Vitals and nursing note reviewed.   76 year old female, resting comfortably and in no acute distress. Vital signs are normal. Oxygen saturation is 90%, which is normal. Head is normocephalic and atraumatic. PERRLA, EOMI. Oropharynx is clear. Neck is nontender and supple without adenopathy or JVD. Back is nontender and there is no CVA tenderness. Lungs are clear without rales, wheezes, or rhonchi. Chest is nontender. Heart has regular rate and rhythm without murmur. Abdomen is soft, flat, nontender. Extremities have no cyanosis or edema, full range of motion is present. Skin is warm and dry without rash. Neurologic: Mental status is  normal, cranial nerves are intact, moves all extremities equally.  ED Results / Procedures / Treatments   Labs (all labs ordered are listed, but only abnormal results are displayed) Labs Reviewed  CBG MONITORING, ED - Abnormal; Notable for the following components:      Result Value   Glucose-Capillary 153 (*)    All other components within normal limits  COMPREHENSIVE METABOLIC PANEL  CBC WITH DIFFERENTIAL/PLATELET  URINALYSIS, ROUTINE W REFLEX MICROSCOPIC    EKG None  Radiology No results found.  Procedures Procedures  Cardiac monitor shows normal sinus rhythm, per my interpretation.  Medications Ordered in ED Medications - No data to display  ED Course/ Medical Decision Making/ A&P                           Medical Decision Making  Recurrent hypoglycemia in patient who is diabetic.  Review of her medications for diabetes shows she is on insulin glargine, insulin lispro and Ozempic.  She is not on any sulfonylureas.  CBG on arrival is 153, which is acceptable.  I have ordered screening labs of CBC, comprehensive metabolic panel, urinalysis, and I have ordered CBG monitoring.  If she continues to have episodes of hypoglycemia, she may need short stay  admission.  {Document critical care time when appropriate:1} {Document review of labs and clinical decision tools ie heart score, Chads2Vasc2 etc:1}  {Document your independent review of radiology images, and any outside records:1} {Document your discussion with family members, caretakers, and with consultants:1} {Document social determinants of health affecting pt's care:1} {Document your decision making why or why not admission, treatments were needed:1} Final Clinical Impression(s) / ED Diagnoses Final diagnoses:  None    Rx / DC Orders ED Discharge Orders     None

## 2022-05-17 LAB — COMPREHENSIVE METABOLIC PANEL
ALT: 13 U/L (ref 0–44)
AST: 18 U/L (ref 15–41)
Albumin: 3.2 g/dL — ABNORMAL LOW (ref 3.5–5.0)
Alkaline Phosphatase: 84 U/L (ref 38–126)
Anion gap: 8 (ref 5–15)
BUN: 13 mg/dL (ref 8–23)
CO2: 26 mmol/L (ref 22–32)
Calcium: 8.6 mg/dL — ABNORMAL LOW (ref 8.9–10.3)
Chloride: 106 mmol/L (ref 98–111)
Creatinine, Ser: 0.83 mg/dL (ref 0.44–1.00)
GFR, Estimated: >60 mL/min (ref >60)
Glucose, Bld: 161 mg/dL — ABNORMAL HIGH (ref 70–99)
Potassium: 3 mmol/L — ABNORMAL LOW (ref 3.5–5.1)
Sodium: 140 mmol/L (ref 135–145)
Total Bilirubin: 0.3 mg/dL (ref 0.3–1.2)
Total Protein: 7.2 g/dL (ref 6.5–8.1)

## 2022-05-17 LAB — URINALYSIS, ROUTINE W REFLEX MICROSCOPIC
Bilirubin Urine: NEGATIVE
Glucose, UA: NEGATIVE mg/dL
Hgb urine dipstick: NEGATIVE
Ketones, ur: NEGATIVE mg/dL
Nitrite: NEGATIVE
Protein, ur: NEGATIVE mg/dL
Specific Gravity, Urine: 1.01 (ref 1.005–1.030)
pH: 5 (ref 5.0–8.0)

## 2022-05-17 LAB — MAGNESIUM: Magnesium: 2.1 mg/dL (ref 1.7–2.4)

## 2022-05-17 LAB — CBG MONITORING, ED
Glucose-Capillary: 117 mg/dL — ABNORMAL HIGH (ref 70–99)
Glucose-Capillary: 160 mg/dL — ABNORMAL HIGH (ref 70–99)

## 2022-05-17 MED ORDER — POTASSIUM CHLORIDE CRYS ER 20 MEQ PO TBCR
40.0000 meq | EXTENDED_RELEASE_TABLET | Freq: Once | ORAL | Status: AC
  Administered 2022-05-17: 40 meq via ORAL
  Filled 2022-05-17: qty 2

## 2022-05-17 MED ORDER — POTASSIUM CHLORIDE CRYS ER 20 MEQ PO TBCR: 40.0000 meq | EXTENDED_RELEASE_TABLET | Freq: Two times a day (BID) | ORAL | 0 refills | Status: DC

## 2022-05-17 NOTE — Discharge Instructions (Addendum)
Please monitor your blood sugar carefully.  Make sure to eat your meals at the appropriate time and an appropriate amount of food.  If your blood sugar drops, you may need to increase the amount that you are eating.  If you are unable to keep your blood sugar at an appropriate level, you may return to the emergency department.  You have an anemia which is a little worse today than it had been.  You need to follow-up with your primary care provider to monitor it.  Your potassium was low today.  I have increased the dose of your potassium pills.  You need to take 2 pills, twice a day.  Your primary care provider will need to monitor your potassium and make sure it is staying at a safe level.

## 2022-05-18 ENCOUNTER — Other Ambulatory Visit: Payer: Self-pay | Admitting: Internal Medicine

## 2022-05-21 ENCOUNTER — Telehealth: Payer: Self-pay

## 2022-05-21 NOTE — Telephone Encounter (Addendum)
Transition Care Management Follow-up Telephone Call Date of discharge and from where: 04/26/2022 [facility]  How have you been since you were released from the hospital? Pt states she feels good, she does not need an apt being she feels okay. Patient has an apt on 12/21 & she will follow up then. Any questions or concerns? No  Items Reviewed: Did the pt receive and understand the discharge instructions provided? Yes  Medications obtained and verified? Yes  Other? No  Any new allergies since your discharge? No  Dietary orders reviewed? Yes Do you have support at home? Yes   Home Care and Equipment/Supplies: Were home health services ordered? no If so, what is the name of the agency? N/a  Has the agency set up a time to come to the patient's home? no Were any new equipment or medical supplies ordered?  No What is the name of the medical supply agency? N/a Were you able to get the supplies/equipment? no Do you have any questions related to the use of the equipment or supplies? No  Functional Questionnaire: (I = Independent and D = Dependent) ADLs: i  Bathing/Dressing- i  Meal Prep- i  Eating- i  Maintaining continence- i  Transferring/Ambulation- i  Managing Meds- i  Follow up appointments reviewed:  PCP Hospital f/u appt confirmed? No  Scheduled to see n/a on n/a @ n/a. [facility] f/u appt confirmed? No  Scheduled to see n/a on n/a @ n/a. Are transportation arrangements needed? No  If their condition worsens, is the pt aware to call PCP or go to the Emergency Dept.? Yes Was the patient provided with contact information for the PCP's office or ED? Yes Was to pt encouraged to call back with questions or concerns? Yes

## 2022-05-28 ENCOUNTER — Telehealth: Payer: Self-pay

## 2022-05-28 NOTE — Progress Notes (Signed)
Chronic Care Management Pharmacy Assistant   Name: SRITHA CHAUNCEY  MRN: [id number] DOB: March 11, 1946  Reason for Encounter: Disease State/ Diabetes  Recent office visits:  04-23-2022 [name], MD. Bridgette Habermann [facility]  Recent consult visits:  05-07-2022 Marshell Garfinkel, MD (Pulmonary). Follow up visit.   04-20-2022 Cameron Sprang, MD (Neurology). Follow up visit.  Hospital visits:  Medication Reconciliation was completed by comparing discharge summary, patient's EMR and Pharmacy list, and upon discussion with patient.  Admitted to the hospital on 05-16-2022 due to hypoglycemia. Discharge date was 05-17-2022. Discharged from [facility]?Medications Started at [facility] Discharge:?? None  Medication Changes at Hospital Discharge: Potassium chloride 20 meq 2 tablets twice daily in addition 60 on Metolazone.  Medications Discontinued at Hospital Discharge: None  Medications that remain the same after Hospital Discharge:??  -All other medications will remain the same.    Medications: Outpatient Encounter Medications as of 05/28/2022  Medication Sig   acetaminophen (TYLENOL) 500 MG tablet Take 500-1,000 mg by mouth every 4 (four) hours as needed for moderate pain.    albuterol (VENTOLIN HFA) 108 (90 Base) MCG/ACT inhaler Inhale 1-2 puffs into the lungs every 6 (six) hours as needed for wheezing or shortness of breath.   Alcohol Swabs (DROPSAFE ALCOHOL PREP) 70 % PADS USE TWO TIMES DAILY AS DIRECTED   Ascorbic Acid (VITAMIN C) 1000 MG tablet Take 1,000 mg by mouth daily.   atorvastatin (LIPITOR) 10 MG tablet TAKE 1 TABLET EVERY EVENING   B-D ULTRAFINE III SHORT PEN 31G X 8 MM MISC USE DAILY WITH VICTOZA AND/OR NOVOLOG.   benzonatate (TESSALON) 100 MG capsule Take 1 capsule (100 mg total) by mouth 3 (three) times daily as needed for cough.   Blood Glucose Calibration (TRUE METRIX LEVEL 1) Low SOLN Use as directed  Dx:e11.22   Blood Glucose Monitoring Suppl (TRUE  METRIX METER) w/Device KIT USE AS DIRECTED   carvedilol (COREG) 12.5 MG tablet Take 1 tablet (12.5 mg total) by mouth 2 (two) times daily.   dexlansoprazole (DEXILANT) 60 MG capsule Take 1 capsule (60 mg total) by mouth daily.   diclofenac sodium (VOLTAREN) 1 % GEL Apply 2 g topically 4 (four) times daily as needed (pain).   ferrous sulfate 324 MG TBEC Patient takes 1 tablet by mouth every other day.   furosemide (LASIX) 20 MG tablet TAKE 4 TABLETS (80 MG) BY MOUTH IN THE MORNING AND 3 TABLETS (60 MG) EVERY EVENING.   Glucagon (GVOKE HYPOPEN 2-PACK) 0.5 MG/0.1ML SOAJ INJECT 0.5 MG INTO THE SKIN DAILY AS NEEDED.   guaiFENesin-dextromethorphan (ROBITUSSIN DM) 100-10 MG/5ML syrup Take 10 mLs by mouth every 6 (six) hours as needed for cough. (Patient not taking: Reported on 03/15/2022)   hydrALAZINE (APRESOLINE) 100 MG tablet Take 1 tablet (100 mg total) by mouth 3 (three) times daily.   hydrocortisone cream 1 % Apply 1 application topically daily as needed for itching.    insulin glargine, 1 Unit Dial, (TOUJEO SOLOSTAR) 300 UNIT/ML Solostar Pen INJECT 36 UNITS INTO THE SKIN AT BEDTIME.   insulin lispro (HUMALOG KWIKPEN) 100 UNIT/ML KwikPen Inject 0.02-0.04 mLs (2-4 Units total) into the skin 3 (three) times daily as needed (high blood sugar over 150).   Isopropyl Alcohol (ALCOHOL WIPES) 70 % MISC Apply 1 each topically 2 (two) times daily.   isosorbide mononitrate (IMDUR) 30 MG 24 hr tablet TAKE 1 TABLET EVERY DAY   lamoTRIgine (LAMICTAL) 150 MG tablet Take 1 tablet (150 mg total)  by mouth 2 (two) times daily.   levETIRAcetam (KEPPRA) 500 MG tablet Take 2 tablets every night   ondansetron (ZOFRAN) 4 MG tablet Take 1 tablet (4 mg total) by mouth every 8 (eight) hours as needed for nausea or vomiting.   OZEMPIC, 1 MG/DOSE, 4 MG/3ML SOPN INJECT 1 MG INTO THE SKIN ONCE A WEEK.   Pancrelipase, Lip-Prot-Amyl, 25000-79000 units CPEP Take 1-2 capsules by mouth See admin instructions. Take 1 capsules in the  morning with breakfast, 1 capsule with lunch, and 2 capsules with supper   potassium chloride SA (KLOR-CON M) 20 MEQ tablet Take 2 tablets (40 mEq total) by mouth 2 (two) times daily. With additional 60 on Metolazone days   sennosides-docusate sodium (SENOKOT-S) 8.6-50 MG tablet Take 1-2 tablets by mouth at bedtime as needed for constipation.   sertraline (ZOLOFT) 50 MG tablet TAKE 1 TABLET EVERY DAY   tiZANidine (ZANAFLEX) 2 MG tablet Take one tablet by mouth at bedtime as needed   tolnaftate (TINACTIN) 1 % cream Apply 1 application topically daily as needed (foot fungus).    TRUE METRIX BLOOD GLUCOSE TEST test strip USE AS DIRECTED TO CHECK BLOOD SUGARS 2 TIMES PER DAY   TRUEplus Lancets 33G MISC TEST BLOOD SUGAR TWICE DAILY AS DIRECTED   No facility-administered encounter medications on file as of 05/28/2022.  Recent Relevant Labs: Lab Results  Component Value Date/Time   HGBA1C 7.0 (H) 03/15/2022 04:59 PM   HGBA1C 7.3 (H) 12/12/2021 05:13 PM   MICROALBUR 10 06/06/2020 01:23 PM   MICROALBUR 10 01/06/2020 12:16 PM    Kidney Function Lab Results  Component Value Date/Time   CREATININE 0.83 05/16/2022 11:30 PM   CREATININE 0.97 03/15/2022 04:59 PM   CREATININE 0.91 10/04/2016 11:44 AM   GFR 86.68 05/15/2016 01:35 PM   GFRNONAA >60 05/16/2022 11:30 PM   GFRNONAA 65 10/04/2016 11:44 AM   GFRAA 75 06/06/2020 04:00 PM   GFRAA 75 10/04/2016 11:44 AM    Current antihyperglycemic regimen:  Toujeo 36 units daily (Patient stated Dr. Baird Cancer decreased to 25 units) Ozempic 1 mg weekly Humalog -Inject 0.02-0.04 mLs (2-4 Units total) into the skin 3 (three) times daily   What recent interventions/DTPs have been made to improve glycemic control:  Educated on A1c and blood sugar goals; Prevention and management of hypoglycemic episodes; Benefits of routine self-monitoring of blood sugar; Carbohydrate counting and/or plate method  Have there been any recent hospitalizations or ED visits  since last visit with CPP? Yes  Patient denies hypoglycemic symptoms. Patient stated when her sugar dropped on 05-16-2022 symptoms were blurry vision and she fainted.  Patient denies hyperglycemic symptoms  How often are you checking your blood sugar? once daily  What are your blood sugars ranging?  Fasting: 12-07 130, 12-06 138, 12-05 96, 12-04 91, 12-03 123 Before meals: None After meals: None Bedtime: None  During the week, how often does your blood glucose drop below 70? On 11-22 48. Patient went to ED.  Are you checking your feet daily/regularly? Daily   Adherence Review: Is the patient currently on a STATIN medication? Yes Is the patient currently on ACE/ARB medication? No Does the patient have >5 day gap between last estimated fill dates? No  Care Gaps: Covid booster overdue Yearly ophthalmology exam overdue Tdap overdue  Star Rating Drugs: Ozempic 1 mg- Last filled 04-23-2022 [facility]. Previous 02-01-2022 90 DS [employer] Atorvastatin 10 mg- Last filled 05-12-2022 90 [facility]. Previous 03-01-2022 [place]  [place] Clinical Pharmacist Assistant [phone]

## 2022-06-14 ENCOUNTER — Encounter: Payer: Self-pay | Admitting: Internal Medicine

## 2022-06-14 ENCOUNTER — Ambulatory Visit (INDEPENDENT_AMBULATORY_CARE_PROVIDER_SITE_OTHER): Payer: Medicare HMO | Admitting: Internal Medicine

## 2022-06-14 VITALS — BP 130/70 | HR 100 | Temp 98.2°F | Ht 61.0 in | Wt 177.0 lb

## 2022-06-14 DIAGNOSIS — Z794 Long term (current) use of insulin: Secondary | ICD-10-CM

## 2022-06-14 DIAGNOSIS — E1122 Type 2 diabetes mellitus with diabetic chronic kidney disease: Secondary | ICD-10-CM | POA: Diagnosis not present

## 2022-06-14 DIAGNOSIS — I5032 Chronic diastolic (congestive) heart failure: Secondary | ICD-10-CM | POA: Diagnosis not present

## 2022-06-14 DIAGNOSIS — N182 Chronic kidney disease, stage 2 (mild): Secondary | ICD-10-CM | POA: Diagnosis not present

## 2022-06-14 DIAGNOSIS — E6609 Other obesity due to excess calories: Secondary | ICD-10-CM

## 2022-06-14 DIAGNOSIS — Z23 Encounter for immunization: Secondary | ICD-10-CM

## 2022-06-14 DIAGNOSIS — Z6833 Body mass index (BMI) 33.0-33.9, adult: Secondary | ICD-10-CM

## 2022-06-14 DIAGNOSIS — D649 Anemia, unspecified: Secondary | ICD-10-CM

## 2022-06-14 DIAGNOSIS — I130 Hypertensive heart and chronic kidney disease with heart failure and stage 1 through stage 4 chronic kidney disease, or unspecified chronic kidney disease: Secondary | ICD-10-CM

## 2022-06-14 DIAGNOSIS — E876 Hypokalemia: Secondary | ICD-10-CM

## 2022-06-14 DIAGNOSIS — I110 Hypertensive heart disease with heart failure: Secondary | ICD-10-CM

## 2022-06-14 MED ORDER — TETANUS-DIPHTH-ACELL PERTUSSIS 5-2.5-18.5 LF-MCG/0.5 IM SUSP: 0.5000 mL | Freq: Once | INTRAMUSCULAR | 0 refills | Status: AC

## 2022-06-14 NOTE — Patient Instructions (Signed)

## 2022-06-14 NOTE — Progress Notes (Signed)
Rich Brave [place]leton,acting as a Education administrator for [name], MD.,have documented all relevant documentation on the behalf of [name], MD,as directed by  [name], MD while in the presence of [name], MD.    Subjective:     Patient ID: [name] , female    DOB: 05-15-1946 , 76 y.o.   MRN: [id number]   Chief Complaint  Patient presents with   Diabetes   Hypertension    HPI  She is here today for a diabetes and BP check. She reports compliance with meds. Patient reports her blood sugars have been all over the place. She is not sure what may have contributed to the lability of her blood sugars. No change in diet.   Diabetes She presents for her follow-up diabetic visit. She has type 2 diabetes mellitus. Her disease course has been improving. There are no hypoglycemic associated symptoms. Pertinent negatives for diabetes include no blurred vision, no chest pain, no polydipsia, no polyphagia and no polyuria. There are no hypoglycemic complications. Diabetic complications include nephropathy. Risk factors for coronary artery disease include diabetes mellitus, dyslipidemia, hypertension, obesity, post-menopausal and sedentary lifestyle. She is compliant with treatment some of the time. Her weight is fluctuating minimally. She is following a generally healthy diet. She never participates in exercise. Her home blood glucose trend is decreasing steadily. Her breakfast blood glucose is taken between 8-9 am. Her breakfast blood glucose range is generally 130-140 mg/dl. An ACE inhibitor/angiotensin II receptor blocker is being taken. Eye exam is not current.  Hypertension This is a chronic problem. The current episode started more than 1 year ago. The problem has been gradually improving since onset. The problem is controlled. Pertinent negatives include no blurred vision, chest pain, palpitations or shortness of breath. The current treatment provides moderate improvement.  Hypertensive end-organ damage includes kidney disease. Identifiable causes of hypertension include sleep apnea.     Past Medical History:  Diagnosis Date   Abnormal liver function     in the past.   Anemia    Arthritis    all over   Bruises easily    Cataract    right eye;immature   CHF (congestive heart failure) (HCC)    Chronic back pain    stenosis   Chronic cough    Chronic kidney disease    COPD (chronic obstructive pulmonary disease) ([place])    COVID    aug/sept 2021   Demand myocardial infarction [facility]) 2012   Demand Infarction in setting of Pancreatitis --> mild Troponin elevation, NON-OBSTRUCTIVE CAD   Depression    takes Abilify daily as well as Zoloft   Diastolic heart failure 1157   Grade 1 diastolic Dysfunction by Echo    Diverticulosis    DM (diabetes mellitus) ([place])    takes Victoza daily as well as Lantus and Humalog   Empty sella ([place])    on MRI in 2009.   Glaucoma    Headache(784.0)    last migraine-4-48yr ago   History of blood transfusion    no abnormal reaction noted   History of colon polyps    benign   HTN (hypertension)    takes Benicar,Imdur,and Bystolic daily   Hyperlipidemia    takes Lipitor daily   Joint swelling    Nocturia    Obesity hypoventilation syndrome ([place]    Obstructive sleep apnea 02/2018   Notably improved split-night study with weight loss from 270 (2017) down to 250 pounds (2019).  Pancreatitis    takes Pancrelipase daily   Parkinson's disease    takes Sinemet daily   Peripheral neuropathy    Pneumonia 2012   Seizures ([place])    takes Lamictal daily and Primidone nightly;last seizure 2wks ago   Urinary urgency    With increased frequency   Varicose veins of both lower extremities with pain    With edema.  Takes daily Lasix     Family History  Problem Relation Age of Onset   Allergies Father    Heart disease Father        before 4   Heart failure Father    Hypertension Father    Hyperlipidemia Father    Heart  disease Mother    Diabetes Mother    Hypertension Mother    Hyperlipidemia Mother    Hypertension Sister    Heart disease Sister        before 72   Hyperlipidemia Sister    Diabetes Brother    Hypertension Brother    Diabetes Sister    Hypertension Sister    Diabetes Son    Hypertension Son    Cancer Other      Current Outpatient Medications:    acetaminophen (TYLENOL) 500 MG tablet, Take 500-1,000 mg by mouth every 4 (four) hours as needed for moderate pain. , Disp: , Rfl:    albuterol (VENTOLIN HFA) 108 (90 Base) MCG/ACT inhaler, Inhale 1-2 puffs into the lungs every 6 (six) hours as needed for wheezing or shortness of breath., Disp: 18 g, Rfl: 5   Alcohol Swabs (DROPSAFE ALCOHOL PREP) 70 % PADS, USE TWO TIMES DAILY AS DIRECTED, Disp: 200 each, Rfl: 6   Ascorbic Acid (VITAMIN C) 1000 MG tablet, Take 1,000 mg by mouth daily., Disp: , Rfl:    atorvastatin (LIPITOR) 10 MG tablet, TAKE 1 TABLET EVERY EVENING, Disp: 90 tablet, Rfl: 2   B-D ULTRAFINE III SHORT PEN 31G X 8 MM MISC, USE DAILY WITH VICTOZA AND/OR NOVOLOG., Disp: 400 each, Rfl: 2   benzonatate (TESSALON) 100 MG capsule, Take 1 capsule (100 mg total) by mouth 3 (three) times daily as needed for cough., Disp: 30 capsule, Rfl: 0   Blood Glucose Calibration (TRUE METRIX LEVEL 1) Low SOLN, Use as directed  Dx:[id number], Disp: 1 each, Rfl: 2   Blood Glucose Monitoring Suppl (TRUE METRIX METER) w/Device KIT, USE AS DIRECTED, Disp: 1 kit, Rfl: 1   carvedilol (COREG) 12.5 MG tablet, Take 1 tablet (12.5 mg total) by mouth 2 (two) times daily., Disp: 180 tablet, Rfl: 3   dexlansoprazole (DEXILANT) 60 MG capsule, Take 1 capsule (60 mg total) by mouth daily., Disp: 90 capsule, Rfl: 2   diclofenac sodium (VOLTAREN) 1 % GEL, Apply 2 g topically 4 (four) times daily as needed (pain)., Disp: , Rfl:    ferrous sulfate 324 MG TBEC, Patient takes 1 tablet by mouth every other day., Disp: , Rfl:    furosemide (LASIX) 20 MG tablet, TAKE 4 TABLETS (80  MG) BY MOUTH IN THE MORNING AND 3 TABLETS (60 MG) EVERY EVENING., Disp: 210 tablet, Rfl: 6   Glucagon (GVOKE HYPOPEN 2-PACK) 0.5 MG/0.1ML SOAJ, INJECT 0.5 MG INTO THE SKIN DAILY AS NEEDED., Disp: 2 mL, Rfl: 3   hydrALAZINE (APRESOLINE) 100 MG tablet, Take 1 tablet (100 mg total) by mouth 3 (three) times daily., Disp: 270 tablet, Rfl: 3   hydrocortisone cream 1 %, Apply 1 application topically daily as needed for itching. , Disp: , Rfl:  insulin glargine, 1 Unit Dial, (TOUJEO SOLOSTAR) 300 UNIT/ML Solostar Pen, INJECT 36 UNITS INTO THE SKIN AT BEDTIME., Disp: 9 mL, Rfl: 10   insulin lispro (HUMALOG KWIKPEN) 100 UNIT/ML KwikPen, Inject 0.02-0.04 mLs (2-4 Units total) into the skin 3 (three) times daily as needed (high blood sugar over 150)., Disp: 15 mL, Rfl: 2   isosorbide mononitrate (IMDUR) 30 MG 24 hr tablet, TAKE 1 TABLET EVERY DAY, Disp: 90 tablet, Rfl: 2   levETIRAcetam (KEPPRA) 500 MG tablet, Take 2 tablets every night, Disp: 180 tablet, Rfl: 3   ondansetron (ZOFRAN) 4 MG tablet, Take 1 tablet (4 mg total) by mouth every 8 (eight) hours as needed for nausea or vomiting., Disp: 20 tablet, Rfl: 1   OZEMPIC, 1 MG/DOSE, 4 MG/3ML SOPN, INJECT 1 MG INTO THE SKIN ONCE A WEEK., Disp: 9 mL, Rfl: 3   Pancrelipase, Lip-Prot-Amyl, 25000-79000 units CPEP, Take 1-2 capsules by mouth See admin instructions. Take 1 capsules in the morning with breakfast, 1 capsule with lunch, and 2 capsules with supper, Disp: , Rfl:    potassium chloride SA (KLOR-CON M) 20 MEQ tablet, Take 2 tablets (40 mEq total) by mouth 2 (two) times daily. With additional 60 on Metolazone days, Disp: 120 tablet, Rfl: 0   sennosides-docusate sodium (SENOKOT-S) 8.6-50 MG tablet, Take 1-2 tablets by mouth at bedtime as needed for constipation., Disp: , Rfl:    sertraline (ZOLOFT) 50 MG tablet, TAKE 1 TABLET EVERY DAY, Disp: 90 tablet, Rfl: 2   tolnaftate (TINACTIN) 1 % cream, Apply 1 application topically daily as needed (foot fungus). ,  Disp: , Rfl:    TRUE METRIX BLOOD GLUCOSE TEST test strip, USE AS DIRECTED TO CHECK BLOOD SUGARS 2 TIMES PER DAY, Disp: 200 strip, Rfl: 10   TRUEplus Lancets 33G MISC, TEST BLOOD SUGAR TWICE DAILY AS DIRECTED, Disp: 200 each, Rfl: 3   guaiFENesin-dextromethorphan (ROBITUSSIN DM) 100-10 MG/5ML syrup, Take 10 mLs by mouth every 6 (six) hours as needed for cough. (Patient not taking: Reported on 03/15/2022), Disp: 118 mL, Rfl: 0   Isopropyl Alcohol (ALCOHOL WIPES) 70 % MISC, Apply 1 each topically 2 (two) times daily., Disp: 300 each, Rfl: 3   lamoTRIgine (LAMICTAL) 150 MG tablet, Take 1 tablet (150 mg total) by mouth 2 (two) times daily., Disp: 180 tablet, Rfl: 3   tiZANidine (ZANAFLEX) 2 MG tablet, Take one tablet by mouth at bedtime as needed (Patient not taking: Reported on 06/14/2022), Disp: 30 tablet, Rfl: 2   Allergies  Allergen Reactions   Other Anaphylaxis and Rash    Bleach   Penicillins Hives    Did it involve swelling of the face/tongue/throat, SOB, or low BP? No Did it involve sudden or severe rash/hives, skin peeling, or any reaction on the inside of your mouth or nose? Yes Did you need to seek medical attention at a hospital or doctor's office? Yes When did it last happen?      Childhood allergy  If all above answers are "NO", may proceed with cephalosporin use.     Sulfa Antibiotics Hives   Aspirin Other (See Comments)     On aspirin 81 mg - Rectal bleeding in dec 2018   Codeine     Headache and makes the patient feel "off"     Review of Systems  Constitutional: Negative.   Eyes:  Negative for blurred vision.  Respiratory: Negative.  Negative for shortness of breath.   Cardiovascular: Negative.  Negative for chest pain and palpitations.  Gastrointestinal: Negative.   Endocrine: Negative for polydipsia, polyphagia and polyuria.  Neurological: Negative.   Psychiatric/Behavioral: Negative.       Today's Vitals   06/14/22 1554  BP: 130/70  Pulse: 100  Temp: 98.2 F  (36.8 C)  Weight: 177 lb (80.3 kg)  Height: _0  (1.549 m)  PainSc: 0-No pain   Body mass index is 33.44 kg/m.  Wt Readings from Last 3 Encounters:  06/14/22 177 lb (80.3 kg)  05/16/22 180 lb (81.6 kg)  05/07/22 180 lb (81.6 kg)     Objective:  Physical Exam Vitals and nursing note reviewed.  Constitutional:      Appearance: Normal appearance.  HENT:     Head: Normocephalic and atraumatic.     Nose:     Comments: Masked     Mouth/Throat:     Comments: Masked  Eyes:     Extraocular Movements: Extraocular movements intact.  Cardiovascular:     Rate and Rhythm: Normal rate and regular rhythm.     Heart sounds: Normal heart sounds.  Pulmonary:     Effort: Pulmonary effort is normal.     Breath sounds: Normal breath sounds.  Musculoskeletal:     Cervical back: Normal range of motion.  Skin:    General: Skin is warm.  Neurological:     General: No focal deficit present.     Mental Status: She is alert.  Psychiatric:        Mood and Affect: Mood normal.        Behavior: Behavior normal.         Assessment And Plan:     1. Type 2 diabetes mellitus with stage 2 chronic kidney disease, with long-term current use of insulin (HCC) Comments: Chronic, I will check labs as below. Now down to 20 units of Toujeo nightly, will consider add'n of SGLT2 inh and plan to decrease insulin further. - BMP8+EGFR - CBC no Diff - Hemoglobin A1c  2. Hypertensive heart disease with chronic diastolic congestive heart failure (HCC) Comments: Chronic, controlled. Encouraged to follow low sodium diet. No med changes today.  3. Hypokalemia Comments: I will recheck BMP today. She is encouraged to increase her intake of potassium rich foods.  4. Anemia, unspecified type Comments: I will check labs as below. I will  make further recommendations once her labs are available for review. - Protein electrophoresis, serum - Vitamin B12 - Iron, TIBC and Ferritin Panel  5. Class 1 obesity due to  excess calories with serious comorbidity and body mass index (BMI) of 33.0 to 33.9 in adult Comments: She is encouraged to aim for at least 150 minutes of exercise/week, while striving for BMI<30 to decrease cardiac risk.  6. Immunization due - Tdap (BOOSTRIX) 5-2.5-18.5 LF-MCG/0.5 injection; Inject 0.5 mLs into the muscle once for 1 dose.  Dispense: 0.5 mL; Refill: 0   Patient was given opportunity to ask questions. Patient verbalized understanding of the plan and was able to repeat key elements of the plan. All questions were answered to their satisfaction.   I, [name], MD, have reviewed all documentation for this visit. The documentation on 06/30/22 for the exam, diagnosis, procedures, and orders are all accurate and complete.   IF YOU HAVE BEEN REFERRED TO A SPECIALIST, IT MAY TAKE 1-2 WEEKS TO SCHEDULE/PROCESS THE REFERRAL. IF YOU HAVE NOT HEARD FROM US/SPECIALIST IN TWO WEEKS, PLEASE GIVE [place] A CALL AT [phone].   THE PATIENT IS ENCOURAGED TO PRACTICE SOCIAL DISTANCING  DUE TO THE COVID-19 PANDEMIC.

## 2022-06-20 LAB — PROTEIN ELECTROPHORESIS, SERUM
A/G Ratio: 0.9 (ref 0.7–1.7)
Albumin ELP: 3.6 g/dL (ref 2.9–4.4)
Alpha 1: 0.3 g/dL (ref 0.0–0.4)
Alpha 2: 0.8 g/dL (ref 0.4–1.0)
Beta: 1.2 g/dL (ref 0.7–1.3)
Gamma Globulin: 1.9 g/dL — ABNORMAL HIGH (ref 0.4–1.8)
Globulin, Total: 4.1 g/dL — ABNORMAL HIGH (ref 2.2–3.9)
Total Protein: 7.7 g/dL (ref 6.0–8.5)

## 2022-06-20 LAB — CBC
Hematocrit: 32.9 % — ABNORMAL LOW (ref 34.0–46.6)
Hemoglobin: 10.2 g/dL — ABNORMAL LOW (ref 11.1–15.9)
MCH: 21.9 pg — ABNORMAL LOW (ref 26.6–33.0)
MCHC: 31 g/dL — ABNORMAL LOW (ref 31.5–35.7)
MCV: 71 fL — ABNORMAL LOW (ref 79–97)
Platelets: 347 10*3/uL (ref 150–450)
RBC: 4.66 x10E6/uL (ref 3.77–5.28)
RDW: 15.1 % (ref 11.7–15.4)
WBC: 4.2 10*3/uL (ref 3.4–10.8)

## 2022-06-20 LAB — IRON,TIBC AND FERRITIN PANEL
Ferritin: 20 ng/mL (ref 15–150)
Iron Saturation: 7 % — CL (ref 15–55)
Iron: 24 ug/dL — ABNORMAL LOW (ref 27–139)
Total Iron Binding Capacity: 350 ug/dL (ref 250–450)
UIBC: 326 ug/dL (ref 118–369)

## 2022-06-20 LAB — BMP8+EGFR
BUN/Creatinine Ratio: 17 (ref 12–28)
BUN: 18 mg/dL (ref 8–27)
CO2: 27 mmol/L (ref 20–29)
Calcium: 9.6 mg/dL (ref 8.7–10.3)
Chloride: 100 mmol/L (ref 96–106)
Creatinine, Ser: 1.03 mg/dL — ABNORMAL HIGH (ref 0.57–1.00)
Glucose: 142 mg/dL — ABNORMAL HIGH (ref 70–99)
Potassium: 3.7 mmol/L (ref 3.5–5.2)
Sodium: 140 mmol/L (ref 134–144)
eGFR: 57 mL/min/{1.73_m2} — ABNORMAL LOW (ref >59)

## 2022-06-20 LAB — HEMOGLOBIN A1C
Est. average glucose Bld gHb Est-mCnc: 166 mg/dL
Hgb A1c MFr Bld: 7.4 % — ABNORMAL HIGH (ref 4.8–5.6)

## 2022-06-20 LAB — VITAMIN B12: Vitamin B-12: 519 pg/mL (ref 232–1245)

## 2022-06-22 ENCOUNTER — Telehealth: Payer: Self-pay

## 2022-06-22 NOTE — Chronic Care Management (AMB) (Signed)
     [name] was reminded to have all medications, supplements and any blood glucose and blood pressure readings available for review with [name], Pharm. D, at her telephone visit on 06-26-2022 at 11:00.   Questions: Have you had any recent office visit or specialist visit outside of [place]? Patient stated no  Are there any concerns you would like to discuss during your office visit? Patient stated no  Are you having any problems obtaining your medications? (Whether it pharmacy issues or cost) Patient stated no  If patient has any PAP medications ask if they are having any problems getting their PAP medication or refill? No PAP meds  Care Gaps: Covid booster overdue Yearly ophthalmology exam overdue Tdap overdue  Star Rating Drug: Ozempic 1 mg- Last filled 04-23-2022 84 DS [facility]. Previous 02-01-2022 90 DS [employer] Atorvastatin 10 mg- Last filled 05-12-2022 90 DS [facility]. Previous 03-01-2022 90 DS [employer]  Any gaps in medications fill history? No  [place] Pharmacist Assistant [phone]

## 2022-06-26 ENCOUNTER — Ambulatory Visit: Payer: Medicare HMO

## 2022-06-26 NOTE — Progress Notes (Addendum)
Opened in error and care coordination note is placed   [name], CPP, PharmD Clinical Pharmacist Practitioner [facility] [phone]

## 2022-07-05 ENCOUNTER — Emergency Department (HOSPITAL_COMMUNITY)
Admission: EM | Admit: 2022-07-05 | Discharge: 2022-07-06 | Disposition: A | Discharge status: Home / Self Care | Payer: Medicare HMO | Attending: Emergency Medicine | Admitting: Emergency Medicine

## 2022-07-05 ENCOUNTER — Other Ambulatory Visit: Payer: Self-pay

## 2022-07-05 ENCOUNTER — Emergency Department (HOSPITAL_COMMUNITY): Payer: Medicare HMO

## 2022-07-05 DIAGNOSIS — R009 Unspecified abnormalities of heart beat: Secondary | ICD-10-CM | POA: Diagnosis not present

## 2022-07-05 DIAGNOSIS — Z1152 Encounter for screening for COVID-19: Secondary | ICD-10-CM | POA: Diagnosis not present

## 2022-07-05 DIAGNOSIS — R569 Unspecified convulsions: Secondary | ICD-10-CM | POA: Diagnosis not present

## 2022-07-05 DIAGNOSIS — Z79899 Other long term (current) drug therapy: Secondary | ICD-10-CM | POA: Diagnosis not present

## 2022-07-05 DIAGNOSIS — R0602 Shortness of breath: Secondary | ICD-10-CM | POA: Diagnosis not present

## 2022-07-05 DIAGNOSIS — R531 Weakness: Secondary | ICD-10-CM | POA: Diagnosis not present

## 2022-07-05 DIAGNOSIS — R29810 Facial weakness: Secondary | ICD-10-CM | POA: Diagnosis not present

## 2022-07-05 DIAGNOSIS — Z7901 Long term (current) use of anticoagulants: Secondary | ICD-10-CM | POA: Diagnosis not present

## 2022-07-05 DIAGNOSIS — I509 Heart failure, unspecified: Secondary | ICD-10-CM | POA: Insufficient documentation

## 2022-07-05 DIAGNOSIS — R29818 Other symptoms and signs involving the nervous system: Secondary | ICD-10-CM | POA: Diagnosis not present

## 2022-07-05 DIAGNOSIS — I639 Cerebral infarction, unspecified: Secondary | ICD-10-CM | POA: Insufficient documentation

## 2022-07-05 DIAGNOSIS — Z794 Long term (current) use of insulin: Secondary | ICD-10-CM | POA: Insufficient documentation

## 2022-07-05 DIAGNOSIS — I110 Hypertensive heart disease with heart failure: Secondary | ICD-10-CM | POA: Diagnosis not present

## 2022-07-05 DIAGNOSIS — Z5181 Encounter for therapeutic drug level monitoring: Secondary | ICD-10-CM | POA: Diagnosis not present

## 2022-07-05 DIAGNOSIS — F445 Conversion disorder with seizures or convulsions: Secondary | ICD-10-CM | POA: Diagnosis not present

## 2022-07-05 DIAGNOSIS — R0989 Other specified symptoms and signs involving the circulatory and respiratory systems: Secondary | ICD-10-CM | POA: Diagnosis not present

## 2022-07-05 DIAGNOSIS — R42 Dizziness and giddiness: Secondary | ICD-10-CM | POA: Diagnosis not present

## 2022-07-05 DIAGNOSIS — R064 Hyperventilation: Secondary | ICD-10-CM | POA: Diagnosis not present

## 2022-07-05 DIAGNOSIS — R2990 Unspecified symptoms and signs involving the nervous system: Secondary | ICD-10-CM

## 2022-07-05 DIAGNOSIS — I484 Atypical atrial flutter: Secondary | ICD-10-CM | POA: Diagnosis not present

## 2022-07-05 DIAGNOSIS — I6782 Cerebral ischemia: Secondary | ICD-10-CM | POA: Diagnosis not present

## 2022-07-05 DIAGNOSIS — I499 Cardiac arrhythmia, unspecified: Secondary | ICD-10-CM

## 2022-07-05 DIAGNOSIS — R0902 Hypoxemia: Secondary | ICD-10-CM | POA: Diagnosis not present

## 2022-07-05 LAB — DIFFERENTIAL
Abs Immature Granulocytes: 0.01 10*3/uL (ref 0.00–0.07)
Basophils Absolute: 0.1 10*3/uL (ref 0.0–0.1)
Basophils Relative: 1 %
Eosinophils Absolute: 0.2 10*3/uL (ref 0.0–0.5)
Eosinophils Relative: 5 %
Immature Granulocytes: 0 %
Lymphocytes Relative: 36 %
Lymphs Abs: 1.8 10*3/uL (ref 0.7–4.0)
Monocytes Absolute: 0.7 10*3/uL (ref 0.1–1.0)
Monocytes Relative: 13 %
Neutro Abs: 2.3 10*3/uL (ref 1.7–7.7)
Neutrophils Relative %: 45 %

## 2022-07-05 LAB — CBC
HCT: 38.4 % (ref 36.0–46.0)
Hemoglobin: 10.6 g/dL — ABNORMAL LOW (ref 12.0–15.0)
MCH: 22.4 pg — ABNORMAL LOW (ref 26.0–34.0)
MCHC: 27.6 g/dL — ABNORMAL LOW (ref 30.0–36.0)
MCV: 81 fL (ref 80.0–100.0)
Platelets: 252 10*3/uL (ref 150–400)
RBC: 4.74 MIL/uL (ref 3.87–5.11)
RDW: 17.8 % — ABNORMAL HIGH (ref 11.5–15.5)
WBC: 5 10*3/uL (ref 4.0–10.5)
nRBC: 0 % (ref 0.0–0.2)

## 2022-07-05 LAB — COMPREHENSIVE METABOLIC PANEL
ALT: 11 U/L (ref 0–44)
AST: 18 U/L (ref 15–41)
Albumin: 3.3 g/dL — ABNORMAL LOW (ref 3.5–5.0)
Alkaline Phosphatase: 86 U/L (ref 38–126)
Anion gap: 13 (ref 5–15)
BUN: 11 mg/dL (ref 8–23)
CO2: 22 mmol/L (ref 22–32)
Calcium: 8.9 mg/dL (ref 8.9–10.3)
Chloride: 100 mmol/L (ref 98–111)
Creatinine, Ser: 0.92 mg/dL (ref 0.44–1.00)
GFR, Estimated: >60 mL/min (ref >60)
Glucose, Bld: 117 mg/dL — ABNORMAL HIGH (ref 70–99)
Potassium: 3.5 mmol/L (ref 3.5–5.1)
Sodium: 135 mmol/L (ref 135–145)
Total Bilirubin: 0.5 mg/dL (ref 0.3–1.2)
Total Protein: 7.8 g/dL (ref 6.5–8.1)

## 2022-07-05 LAB — I-STAT CHEM 8, ED
BUN: 11 mg/dL (ref 8–23)
Calcium, Ion: 1 mmol/L — ABNORMAL LOW (ref 1.15–1.40)
Chloride: 101 mmol/L (ref 98–111)
Creatinine, Ser: 0.9 mg/dL (ref 0.44–1.00)
Glucose, Bld: 116 mg/dL — ABNORMAL HIGH (ref 70–99)
HCT: 34 % — ABNORMAL LOW (ref 36.0–46.0)
Hemoglobin: 11.6 g/dL — ABNORMAL LOW (ref 12.0–15.0)
Potassium: 3.5 mmol/L (ref 3.5–5.1)
Sodium: 136 mmol/L (ref 135–145)
TCO2: 24 mmol/L (ref 22–32)

## 2022-07-05 MED ORDER — LORAZEPAM 1 MG PO TABS: 0.5000 mg | ORAL_TABLET | ORAL | Status: DC | PRN

## 2022-07-05 MED ORDER — SODIUM CHLORIDE 0.9% FLUSH: 3.0000 mL | Freq: Once | INTRAVENOUS | Status: DC

## 2022-07-05 NOTE — Code Documentation (Signed)
Stroke Response Nurse Documentation Code Documentation  [name] is a 77 y.o. female arriving to [facility]  via [facility] on 1/11 with past medical hx of HLD, DM, CHF, obesity, COPD. On No antithrombotic. Code stroke was activated by EMS.   Patient from home where she was LKW at 1800 and now complaining of left sided weakness.  Stroke team at the bedside on patient arrival. Labs drawn and patient cleared for CT by Bernarda Caffey PA. Patient to CT with team. NIHSS 2, see documentation for details and code stroke times. Patient with left arm weakness and left leg weakness on exam. The following imaging was completed:  CT Head. Patient is not a candidate for IV Thrombolytic due to minimal deficits. Patient is not a candidate for IR due to low suspicion for LVO.    Bedside handoff with ED RN Amy.    Madelynn Done  Rapid Response RN

## 2022-07-05 NOTE — ED Notes (Signed)
ED Provider at bedside. 

## 2022-07-05 NOTE — ED Provider Notes (Addendum)
[facility] EMERGENCY DEPARTMENT Provider Note   CSN: [id number] Arrival date & time: 07/05/22  2222  An emergency department physician performed an initial assessment on this suspected stroke patient at 2231.  History  Chief Complaint  Patient presents with   Code Stroke    [name] is a 77 y.o. female who presents with the ED via EMS as Code Stroke.  Patient with history of seizures in the past, on Keppra and Lamictal.  Reportedly patient was behaving abnormally, had some seizure-like episodes, then had onset of Droop prompting EMS call.  Last known well 6 PM.  Per EMS patient reportedly had 3 brief episodes of seizure-like activity without postictal state. I reviewed her medical records.  Additionally pelvis history she is history of obesity hypoventilation syndrome, pulmonary hypertension, CHF with CardioMEMS CHF monitoring device placed in May 2021 for which she follows with Dr. [name].  History of type 2 diabetes compliant with her insulin, sleep apnea on CPAP.  Reportedly experiencing shortness of breath today as well.  She is not anticoagulated per chart review.  Of note, Neurologist documented EMS of SOB, however patient states she has not had any change from her baseline SOB. Endorses compliance with diuresis, denies worsening swelling in the lower extremities or arms.   HPI     Home Medications Prior to Admission medications   Medication Sig Start Date End Date Taking? Authorizing Provider  apixaban (ELIQUIS) 5 MG TABS tablet Take 1 tablet (5 mg total) by mouth 2 (two) times daily. 07/06/22 08/05/22 Yes Erza Mothershead, Gypsy Balsam, PA-C  acetaminophen (TYLENOL) 500 MG tablet Take 500-1,000 mg by mouth every 4 (four) hours as needed for moderate pain.     [provider]  albuterol (VENTOLIN HFA) 108 (90 Base) MCG/ACT inhaler Inhale 1-2 puffs into the lungs every 6 (six) hours as needed for wheezing or shortness of breath. 06/08/21   Marshell Garfinkel,  MD  Alcohol Swabs (DROPSAFE ALCOHOL PREP) 70 % PADS USE TWO TIMES DAILY AS DIRECTED 09/25/21   [name], MD  Ascorbic Acid (VITAMIN C) 1000 MG tablet Take 1,000 mg by mouth daily.    [provider]  atorvastatin (LIPITOR) 10 MG tablet TAKE 1 TABLET EVERY EVENING 03/01/22   [name], MD  B-D ULTRAFINE III SHORT PEN 31G X 8 MM MISC USE DAILY WITH VICTOZA AND/OR NOVOLOG. 09/25/21   [name], MD  benzonatate (TESSALON) 100 MG capsule Take 1 capsule (100 mg total) by mouth 3 (three) times daily as needed for cough. 04/23/22   [name], MD  Blood Glucose Calibration (TRUE METRIX LEVEL 1) Low SOLN Use as directed  Dx:e11.22 11/28/20   [name], MD  Blood Glucose Monitoring Suppl (TRUE METRIX METER) w/Device KIT USE AS DIRECTED 05/08/21   [name], MD  carvedilol (COREG) 12.5 MG tablet Take 1 tablet (12.5 mg total) by mouth 2 (two) times daily. 05/30/21 11/02/22  Larey Dresser, MD  dexlansoprazole (DEXILANT) 60 MG capsule Take 1 capsule (60 mg total) by mouth daily. 04/23/22   [name], MD  diclofenac sodium (VOLTAREN) 1 % GEL Apply 2 g topically 4 (four) times daily as needed (pain). 05/17/18   Geradine Girt, DO  ferrous sulfate 324 MG TBEC Patient takes 1 tablet by mouth every other day.    [provider]  furosemide (LASIX) 20 MG tablet TAKE 4 TABLETS (80 MG) BY MOUTH IN THE MORNING AND 3 TABLETS (60 MG) EVERY EVENING. 02/28/22   [place],  Maricela Bo, FNP  Glucagon (GVOKE HYPOPEN 2-PACK) 0.5 MG/0.1ML SOAJ INJECT 0.5 MG INTO THE SKIN DAILY AS NEEDED. 05/19/22   [name], MD  guaiFENesin-dextromethorphan [facility] DM) 100-10 MG/5ML syrup Take 10 mLs by mouth every 6 (six) hours as needed for cough. Patient not taking: Reported on 03/15/2022 11/18/20   [place] Donnamarie Poag, DO  hydrALAZINE (APRESOLINE) 100 MG tablet Take 1 tablet (100 mg total) by mouth 3 (three) times daily. 05/04/21   Larey Dresser, MD  hydrocortisone cream 1 % Apply 1 application  topically daily as needed for itching.     [provider]  insulin glargine, 1 Unit Dial, (TOUJEO SOLOSTAR) 300 UNIT/ML Solostar Pen INJECT 36 UNITS INTO THE SKIN AT BEDTIME. 05/07/22   Minette Brine, FNP  insulin lispro (HUMALOG KWIKPEN) 100 UNIT/ML KwikPen Inject 0.02-0.04 mLs (2-4 Units total) into the skin 3 (three) times daily as needed (high blood sugar over 150). 11/23/20   Bary Castilla, NP  Isopropyl Alcohol (ALCOHOL WIPES) 70 % MISC Apply 1 each topically 2 (two) times daily. 06/25/19   Minette Brine, FNP  isosorbide mononitrate (IMDUR) 30 MG 24 hr tablet TAKE 1 TABLET EVERY DAY 01/01/22   Leonie Man, MD  lamoTRIgine (LAMICTAL) 150 MG tablet Take 1 tablet (150 mg total) by mouth 2 (two) times daily. 04/20/22   Cameron Sprang, MD  levETIRAcetam (KEPPRA) 500 MG tablet Take 2 tablets every night 04/20/22   Cameron Sprang, MD  ondansetron (ZOFRAN) 4 MG tablet Take 1 tablet (4 mg total) by mouth every 8 (eight) hours as needed for nausea or vomiting. 12/20/21   [name], MD  OZEMPIC, 1 MG/DOSE, 4 MG/3ML SOPN INJECT 1 MG INTO THE SKIN ONCE A WEEK. 11/02/21   [name], MD  Pancrelipase, Lip-Prot-Amyl, 25000-79000 units CPEP Take 1-2 capsules by mouth See admin instructions. Take 1 capsules in the morning with breakfast, 1 capsule with lunch, and 2 capsules with supper    [provider]  potassium chloride SA (KLOR-CON M) 20 MEQ tablet Take 2 tablets (40 mEq total) by mouth 2 (two) times daily. With additional 60 on Metolazone days 99/24/26   Delora Fuel, MD  sennosides-docusate sodium (SENOKOT-S) 8.6-50 MG tablet Take 1-2 tablets by mouth at bedtime as needed for constipation.    [provider]  sertraline (ZOLOFT) 50 MG tablet TAKE 1 TABLET EVERY DAY 09/25/21   [name], MD  tiZANidine (ZANAFLEX) 2 MG tablet Take one tablet by mouth at bedtime as needed Patient not taking: Reported on 06/14/2022 04/24/22   [name], MD  tolnaftate  (TINACTIN) 1 % cream Apply 1 application topically daily as needed (foot fungus).     [provider]  TRUE METRIX BLOOD GLUCOSE TEST test strip USE AS DIRECTED TO CHECK BLOOD SUGARS 2 TIMES PER DAY 04/25/22   [name], MD  TRUEplus Lancets 33G MISC TEST BLOOD SUGAR TWICE DAILY AS DIRECTED 11/11/21   [name], MD      Allergies    Other, Penicillins, Sulfa antibiotics, Aspirin, and Codeine    Review of Systems   Review of Systems  Respiratory:  Negative for shortness of breath.   Neurological:  Positive for dizziness, seizures, weakness and light-headedness.    Physical Exam Updated Vital Signs BP (!) 124/103   Pulse 79   Temp 98.2 F (36.8 C)   Resp 15   Ht '5\' 1"'$  (1.549 m)   Wt 82.4 kg   SpO2 93%   BMI 34.32  kg/m  Physical Exam Vitals and nursing note reviewed.  Constitutional:      Appearance: She is obese. She is not ill-appearing or toxic-appearing.  HENT:     Head: Normocephalic and atraumatic.     Nose: Nose normal.     Mouth/Throat:     Mouth: Mucous membranes are moist.     Pharynx: No oropharyngeal exudate or posterior oropharyngeal erythema.  Eyes:     General:        Right eye: No discharge.        Left eye: No discharge.     Extraocular Movements: Extraocular movements intact.     Conjunctiva/sclera: Conjunctivae normal.     Pupils: Pupils are equal, round, and reactive to light.  Cardiovascular:     Rate and Rhythm: Normal rate and regular rhythm.     Pulses: Normal pulses.     Heart sounds: Normal heart sounds. No murmur heard. Pulmonary:     Effort: Pulmonary effort is normal. No tachypnea, bradypnea, accessory muscle usage, prolonged expiration or respiratory distress.     Breath sounds: Examination of the left-lower field reveals rales. Rales present. No wheezing.  Abdominal:     General: Bowel sounds are normal. There is no distension.     Palpations: Abdomen is soft.     Tenderness: There is no abdominal tenderness. There is  no guarding or rebound.  Musculoskeletal:        General: No deformity.     Cervical back: Neck supple.     Right lower leg: No edema.     Left lower leg: No edema.  Skin:    General: Skin is warm and dry.     Capillary Refill: Capillary refill takes less than 2 seconds.  Neurological:     Mental Status: She is alert and oriented to person, place, and time. Mental status is at baseline.     GCS: GCS eye subscore is 4. GCS verbal subscore is 5. GCS motor subscore is 6.     Cranial Nerves: Cranial nerves 2-12 are intact.     Sensory: Sensory deficit present.     Motor: Weakness present. No pronator drift.     Coordination: Coordination abnormal. Heel to Shin Test abnormal.     Comments: Per patient decreased sensation in the left face.  3/5 grip strength on the left compared to 5/5 on the right. Impaired coordination in the left upper extremity with fine motor movement.   Symmetric strength in lower extremities, impaired coordination in the LLE compared to right.  Gait and romberg deferred due to patient's sense that the room was spinning + lightheadedness.   Psychiatric:        Mood and Affect: Mood normal.     ED Results / Procedures / Treatments   Labs (all labs ordered are listed, but only abnormal results are displayed) Labs Reviewed  CBC - Abnormal; Notable for the following components:      Result Value   Hemoglobin 10.6 (*)    MCH 22.4 (*)    MCHC 27.6 (*)    RDW 17.8 (*)    All other components within normal limits  COMPREHENSIVE METABOLIC PANEL - Abnormal; Notable for the following components:   Glucose, Bld 117 (*)    Albumin 3.3 (*)    All other components within normal limits  BRAIN NATRIURETIC PEPTIDE - Abnormal; Notable for the following components:   B Natriuretic Peptide 167.5 (*)    All other components within normal limits  URINALYSIS, ROUTINE W REFLEX MICROSCOPIC - Abnormal; Notable for the following components:   Color, Urine STRAW (*)    All other  components within normal limits  I-STAT CHEM 8, ED - Abnormal; Notable for the following components:   Glucose, Bld 116 (*)    Calcium, Ion 1.00 (*)    Hemoglobin 11.6 (*)    HCT 34.0 (*)    All other components within normal limits  RESP PANEL BY RT-PCR (RSV, FLU A&B, COVID)  RVPGX2  DIFFERENTIAL  RAPID URINE DRUG SCREEN, HOSP PERFORMED  ETHANOL  PROTIME-INR  APTT  CBG MONITORING, ED  TROPONIN I (HIGH SENSITIVITY)  TROPONIN I (HIGH SENSITIVITY)    EKG EKG Interpretation  Date/Time:  Thursday July 05 2022 22:51:10 EST Ventricular Rate:  81 PR Interval:    QRS Duration: 107 QT Interval:  426 QTC Calculation: 495 R Axis:   95 Text Interpretation: Atrial flutter Right axis deviation Borderline prolonged QT interval No significant change since last tracing Confirmed by Deno Etienne [phone]) on 07/05/2022 10:59:42 PM  Radiology MR BRAIN WO CONTRAST  Result Date: 07/06/2022 CLINICAL DATA:  Initial evaluation for neuro deficit, stroke suspected. EXAM: MRI HEAD WITHOUT CONTRAST TECHNIQUE: Multiplanar, multiecho pulse sequences of the brain and surrounding structures were obtained without intravenous contrast. COMPARISON:  Prior CT from earlier the same day. FINDINGS: Brain: Cerebral volume within normal limits. Small remote cortical infarct present at the posterior right frontal region. Underlying mild chronic microvascular ischemic disease for age. No evidence for acute or subacute ischemia. Gray-white matter differentiation maintained. No acute or chronic intracranial blood products. No mass lesion, midline shift or mass effect. No hydrocephalus or extra-axial fluid collection. Empty sella noted. Vascular: Major intracranial vascular flow voids are maintained. Skull and upper cervical spine: Craniocervical junction within normal limits. Bone marrow signal intensity grossly normal. No scalp soft tissue abnormality. Sinuses/Orbits: Prior bilateral ocular lens replacement. Paranasal sinuses  are largely clear. No mastoid effusion. Other: None. IMPRESSION: 1. No acute intracranial abnormality. 2. Small remote cortical infarct involving the posterior right frontal region. 3. Underlying mild chronic microvascular ischemic disease for age. 4. Empty sella. While this finding is often incidental in nature and of no clinical significance, this can also be seen in the setting of idiopathic intracranial hypertension. Electronically Signed   By: Jeannine Boga M.D.   On: 07/06/2022 00:50   DG Chest Portable 1 View  Result Date: 07/05/2022 CLINICAL DATA:  Rales and shortness of breath EXAM: PORTABLE CHEST 1 VIEW COMPARISON:  12/28/2021 FINDINGS: Stable cardiomegaly. Prominent central pulmonary arteries. Aortic atherosclerotic calcification. Bibasilar atelectasis or infiltrates. No definite pleural effusion. No pneumothorax. IMPRESSION: Bibasilar atelectasis or infiltrates. Cardiomegaly Electronically Signed   By: Placido Sou M.D.   On: 07/05/2022 23:28   CT HEAD CODE STROKE WO CONTRAST  Result Date: 07/05/2022 CLINICAL DATA:  Code stroke. Initial evaluation for neuro deficit, stroke suspected. EXAM: CT HEAD WITHOUT CONTRAST TECHNIQUE: Contiguous axial images were obtained from the base of the skull through the vertex without intravenous contrast. RADIATION DOSE REDUCTION: This exam was performed according to the departmental dose-optimization program which includes automated exposure control, adjustment of the mA and/or kV according to patient size and/or use of iterative reconstruction technique. COMPARISON:  Comparison made with prior study from 12/28/2021. FINDINGS: Brain: Cerebral volume within normal limits. Mild chronic small vessel ischemic disease. Small remote right frontal cortical infarct. No acute intracranial hemorrhage. No acute large vessel territory infarct. No mass lesion, midline shift or mass effect. No hydrocephalus  or extra-axial fluid collection. Vascular: No abnormal  hyperdense vessel. Scattered vascular calcifications noted within the carotid siphons. Skull: Scalp soft tissues and calvarium within normal limits. Sinuses/Orbits: Globes orbital soft tissues within normal limits. Paranasal sinuses are clear. No mastoid effusion. Other: None. ASPECTS [facility] Stroke Program Early CT Score) - Ganglionic level infarction (caudate, lentiform nuclei, internal capsule, insula, M1-M3 cortex): 7 - Supraganglionic infarction (M4-M6 cortex): 3 Total score (0-10 with 10 being normal): 10 IMPRESSION: 1. No acute intracranial abnormality. 2. ASPECTS is 10. 3. Small remote right frontal cortical infarct with underlying mild chronic small vessel ischemic disease. These results were communicated to Dr. Cheral Marker at 10:48 pm on 07/05/2022 by text page via the [facility] messaging system. Electronically Signed   By: Jeannine Boga M.D.   On: 07/05/2022 22:54    Procedures .Critical Care  Performed by: Emeline Darling, PA-C Authorized by: Emeline Darling, PA-C   Critical care provider statement:    Critical care time (minutes):  45   Critical care was time spent personally by me on the following activities:  Development of treatment plan with patient or surrogate, discussions with consultants, evaluation of patient's response to treatment, examination of patient, obtaining history from patient or surrogate, ordering and performing treatments and interventions, ordering and review of laboratory studies, ordering and review of radiographic studies, pulse oximetry and re-evaluation of patient's condition     Medications Ordered in ED Medications  sodium chloride flush (NS) 0.9 % injection 3 mL (3 mLs Intravenous Not Given 07/05/22 2255)    ED Course/ Medical Decision Making/ A&P Clinical Course as of 07/06/22 0630  Fri Jul 06, 2022  [place] [RS]  0076 Repeat neuro exams have all been nonfocal throughout her stay in the ED. She states she feels back to  her baseline. Resting calmly at this time.  [RS]  0529 Plan was for anticoagulation in context of new onset and persistent afib wthout RVR in the ED today, however per chart review patient had history of lower GI bleed in 10/22. Will discuss with cardiology.  Cardiology fellow paged, consult pending.  [RS]  [phone] Unable to contact patient's sister ([place]) via phone) despite multiple attempts.  [RS]  3354 Received call back from patient's sister/POA who is agreeable to collecting her from the ED this morning.  [RS]  5625 Repeat page to cardiology.  [RS]  [id number] Cardiology repaged.  [RS]    Clinical Course User Index [RS] Orson Rho, Gypsy Balsam, PA-C                           Medical Decision Making 77 year old female who presents with concern for ? Seizures, code stroke.   VS normal, cardiopulmonary exam as above with irregularly irregular rhythm, murmur, rales in the bases bilaterally. Abdominal exam benign. Neuro exam as above.   DDX includes but is not limited to seizures, CVA, drug effect, sepsis, dysrhythmia with syncope.   Amount and/or Complexity of Data Reviewed Labs: ordered.    Details: CBC without leukocytosis, mild anemia with hemoglobin of 10.6.  CMP unremarkable.  Troponin negative x 2.  UDS and UA unremarkable.  BNP normal. Radiology: ordered. Decision-making details documented in ED Course.    Details: CT head code stroke with remote frontal cortical infarct, no acute intracranial abnormality.  MRI brain without acute intracranial abnormality, empty sella of unclear significance.  Chest x-ray with bibasilar atelectasis with infiltrates. Images visulaized by  this provider.   Risk Prescription drug management.   Overall patient's workup is very reassuring.  No evidence of stroke or recurrent seizure activity in the emergency department.  From a neurologic perspective patient is medically cleared.  Do recommend she continues her medication with Lamictal and Keppra at home to  prevent recurrent seizure.  Regarding reported shortness of breath, patient denies this at this time, cardiopulmonary exam is remained unremarkable throughout her stay.  Mild changes of atelectasis on her x-ray in the bilateral bases but pulmonary exam is reassuring.  Patient was found to be in persistent afib throughout her stay in the emergency department, which is a new diagnosis for her.  She is on carvedilol, which has likely assisted in her rate control. Plan was to proceed with anticoagulation, however given history of GI bleed in 2022 likely from diverticular bleed, cardiology consult was pursued.  Unfortunately significant delay in receiving call back from cardiology. Care of this patient signed out to oncoming ED provider Alecia Lemming, PA-C at time of shift change. Ultimately anticipate discharge home, pending cardiologist recommendation.   [name] and her sister voiced understanding of her medical evaluation and treatment plan. Each of their questions answered to their expressed satisfaction.   This chart was dictated using voice recognition software, Dragon. Despite the best efforts of this provider to proofread and correct errors, errors may still occur which can change documentation meaning.  Final Clinical Impression(s) / ED Diagnoses Final diagnoses:  None    Rx / DC Orders ED Discharge Orders          Ordered    Amb referral to AFIB Clinic        07/06/22 0525    apixaban (ELIQUIS) 5 MG TABS tablet  2 times daily        07/06/22 0525              Yavier Snider, Gypsy Balsam, PA-C 07/06/22 0630    Deno Etienne, DO 07/06/22 0632    Naidelin Gugliotta, Gypsy Balsam, PA-C 07/06/22 [place], [place], DO 07/06/22 2683

## 2022-07-05 NOTE — Consult Note (Addendum)
NEURO HOSPITALIST CONSULT NOTE   Requesting physician: Dr. Ashok Cordia  Reason for Consult: Acute onset of left sided weakness after asynchronous seizure-like activity without postictal state  History obtained from:  Patient, EMS and Chart     HPI:                                                                                                                                          [name] is a 77 y.o. female with an extensive PMHx as documented below who presents to the ED via EMS as a Code Stroke after she was noted to be weak on her left side after asynchronous seizure-like activity without postictal state afterwards. LKN was 1800. Initial call to EMS was made by family when she had been acting strangely, getting into her bed with her clothes on, complaining of feeling ill and then becoming SOB. First responders noted 3 episodes of brief seizure like activity. On arrival by EMS, the end of one of the seizure-like spells was noted, without any following postictal state. En route she had another seizure-like episode consisting of asynchronous flopping/flailing movements of all 4 of her limbs and her head for about 10 seconds, followed by left sided weakness with motor testing by EMS as well as questionable left facial droop. There was no postictal confusion immediately after the flailing movements stopped. Vitals per EMS: 156/80, HR 70's.   She does have a diagnosis of seizure disorder and is on Lamictal and Keppra at home, prescribed by Dr. Delice Lesch.   Past Medical History:  Diagnosis Date   Abnormal liver function     in the past.   Anemia    Arthritis    all over   Bruises easily    Cataract    right eye;immature   CHF (congestive heart failure) (HCC)    Chronic back pain    stenosis   Chronic cough    Chronic kidney disease    COPD (chronic obstructive pulmonary disease) ([place])    COVID    aug/sept 2021   Demand myocardial infarction [facility]) 2012   Demand  Infarction in setting of Pancreatitis --> mild Troponin elevation, NON-OBSTRUCTIVE CAD   Depression    takes Abilify daily as well as Zoloft   Diastolic heart failure 3734   Grade 1 diastolic Dysfunction by Echo    Diverticulosis    DM (diabetes mellitus) ([place])    takes Victoza daily as well as Lantus and Humalog   Empty sella ([place])    on MRI in 2009.   Glaucoma    Headache(784.0)    last migraine-4-54yr ago   History of blood transfusion    no abnormal reaction noted   History of colon polyps    benign   HTN (hypertension)  takes Benicar,Imdur,and Bystolic daily   Hyperlipidemia    takes Lipitor daily   Joint swelling    Nocturia    Obesity hypoventilation syndrome ([place])    Obstructive sleep apnea 02/2018   Notably improved split-night study with weight loss from 270 (2017) down to 250 pounds (2019).   Pancreatitis    takes Pancrelipase daily   Parkinson's disease    takes Sinemet daily   Peripheral neuropathy    Pneumonia 2012   Seizures ([place])    takes Lamictal daily and Primidone nightly;last seizure 2wks ago   Urinary urgency    With increased frequency   Varicose veins of both lower extremities with pain    With edema.  Takes daily Lasix    Past Surgical History:  Procedure Laterality Date   ABDOMINAL HYSTERECTOMY     APPENDECTOMY     BACK SURGERY     Cardiac Event Monitor  September-October 2017   Sinus rhythm with occasional PACs and artifact. No arrhythmias besides one short run of tachycardia.   CHOLECYSTECTOMY     COLONOSCOPY N/A 08/15/2012   Procedure: COLONOSCOPY;  Surgeon: Arta Silence, MD;  Location: [facility];  Service: Endoscopy;  Laterality: N/A;   COLONOSCOPY WITH PROPOFOL Left 06/17/2017   Procedure: COLONOSCOPY WITH PROPOFOL;  Surgeon: [name], MD;  Location: [place];  Service: Gastroenterology;  Laterality: Left;   COLONOSCOPY WITH PROPOFOL N/A 04/03/2021   Procedure: COLONOSCOPY WITH PROPOFOL;  Surgeon: Clarene Essex, MD;   Location: [place];  Service: Endoscopy;  Laterality: N/A;   CORONARY CALCIUM SCORE AND CTA  04/2018   Coronary calcium score 71.9.  Very large, hyperdynamic LAD wrapping apex giving rise to PDA.  Moderate LAD-diagonal and circumflex plaque -> CT FFR suggested positive findings in distal D1, D2 and distal circumflex.  Referred for cath. ==> FALSE POSITIVE   ESOPHAGOGASTRODUODENOSCOPY     eye cysts Bilateral    LASIK     LEFT HEART CATH AND CORONARY ANGIOGRAPHY N/A 05/16/2018   Procedure: LEFT HEART CATH AND CORONARY ANGIOGRAPHY;  Surgeon: Belva Crome, MD;  Location: [facility] INVASIVE CV LAB:  Angiographically normal coronary arteries with LVEDP of 21 mmHg.  Large draping hyperdominant LAD that wraps the apex and provides distal half of the PDA.  Relatively small caliber distal Cx -->proxLPDA, non-dom RCA. No Cx or Diag lesions -- FALSE + CT FFR   LEFT HEART CATH AND CORONARY ANGIOGRAPHY  2009/March 2012   2009: (Dr. Alvie Heidelberg) Nonobstructive CAD; 2012: Minimal CAD --> false positive stress test   NM MYOVIEW LTD  01/2016; 01/2018   a)LOW RISK. No ischemia or infarction;; b) EF 55-60%. NO ST changes.  No ischemia or Infarction. NO significant RV enlargement.  LOW RISK.   POLYPECTOMY  04/03/2021   Procedure: POLYPECTOMY;  Surgeon: Clarene Essex, MD;  Location: [place];  Service: Endoscopy;;   Polysomnogram  02/2018   (Dr. Brett Fairy from neurology): Split study was not adequately done due to low AHI.  She slept reclined and had an AHI less than 10, prolonged hypoxemia and no hypercapnia. -->  She has home oxygen already prescribed, but did not meet criteria for nightly oxygen.  (Was noted that the patient did not cooperate well with study.  Titration was discussed.   PRESSURE SENSOR/CARDIOMEMS N/A 11/09/2019   Procedure: PRESSURE SENSOR/CARDIOMEMS;  Surgeon: Larey Dresser, MD;  Location: [name];  Service: Cardiovascular;  Laterality: N/A;   RECTAL POLYPECTOMY     RIGHT/LEFT HEART CATH  AND CORONARY  ANGIOGRAPHY N/A 04/06/2019   Procedure: RIGHT/LEFT HEART CATH AND CORONARY ANGIOGRAPHY;  Surgeon: Leonie Man, MD;  Location: [facility] INVASIVE CV LAB; angiographically normal coronary arteries.  Moderately elevated AD LVEDP..  Mild pulmonary hypertension: PA P 56/14 mmHg-mean 31 mmHg.  RAP 10 mmHg.  RV P-EDP 54/4 mmHg - 12 mmHg.  PCWP 11-15 mmHg.  CO-CI: 7.35-3.73.   TONSILLECTOMY     TRANSTHORACIC ECHOCARDIOGRAM  01/2016; 05/2017   A) Normal LV chamber size with mod LVH pattern. EF 50-55%. Severe LA dilation. Mod RA dilation. PAP elevated at 38 mmHg;; B) Mild concentric LVH.  EF 55-60% & no RWMA.  Grade 2 DD.  Diastolic flattening of the ventricular septum == ? elevated PAP.  Mild aortic valve calcification/sclerosis.  Mod LA dilation.  Mild RV dilation & Mod RA dilation   TRANSTHORACIC ECHOCARDIOGRAM  10/2017; 01/2018   a) EF 55-60%.  No RWMA,  Moderate LA dilation.  Mild LA dilation.  Peak PA pressures ~57 mmHg (moderate);;; b) EF 55-60%.  GRII DD.  Suggestion of RV volume overload with moderate RV dilation.  Severe LA and RA dilation.Marland Kitchen  PA pressure ~67%.   TRANSTHORACIC ECHOCARDIOGRAM  01/2019   Normal LV size and function.  EF 50 to 55%.  Impaired relaxation.  RV appears to have moderately reduced function.  Severely enlarged.  Cannot fully assess RV pressures.  Hypermobile interatrial septum.  Right atrium severely dilated.   TUBAL LIGATION     vaginal cyst removed     several times    Family History  Problem Relation Age of Onset   Allergies Father    Heart disease Father        before 81   Heart failure Father    Hypertension Father    Hyperlipidemia Father    Heart disease Mother    Diabetes Mother    Hypertension Mother    Hyperlipidemia Mother    Hypertension Sister    Heart disease Sister        before 22   Hyperlipidemia Sister    Diabetes Brother    Hypertension Brother    Diabetes Sister    Hypertension Sister    Diabetes Son    Hypertension Son     Cancer Other             Social History:  reports that she has quit smoking. Her smoking use included cigarettes. She has a 12.50 pack-year smoking history. She has never used smokeless tobacco. She reports that she does not drink alcohol and does not use drugs.  Allergies  Allergen Reactions   Other Anaphylaxis and Rash    Bleach   Penicillins Hives    Did it involve swelling of the face/tongue/throat, SOB, or low BP? No Did it involve sudden or severe rash/hives, skin peeling, or any reaction on the inside of your mouth or nose? Yes Did you need to seek medical attention at a hospital or doctor's office? Yes When did it last happen?      Childhood allergy  If all above answers are "NO", may proceed with cephalosporin use.     Sulfa Antibiotics Hives   Aspirin Other (See Comments)     On aspirin 81 mg - Rectal bleeding in dec 2018   Codeine     Headache and makes the patient feel "off"    HOME MEDICATIONS:  No current facility-administered medications on file prior to encounter.   Current Outpatient Medications on File Prior to Encounter  Medication Sig Dispense Refill   acetaminophen (TYLENOL) 500 MG tablet Take 500-1,000 mg by mouth every 4 (four) hours as needed for moderate pain.      albuterol (VENTOLIN HFA) 108 (90 Base) MCG/ACT inhaler Inhale 1-2 puffs into the lungs every 6 (six) hours as needed for wheezing or shortness of breath. 18 g 5   Alcohol Swabs (DROPSAFE ALCOHOL PREP) 70 % PADS USE TWO TIMES DAILY AS DIRECTED 200 each 6   Ascorbic Acid (VITAMIN C) 1000 MG tablet Take 1,000 mg by mouth daily.     atorvastatin (LIPITOR) 10 MG tablet TAKE 1 TABLET EVERY EVENING 90 tablet 2   B-D ULTRAFINE III SHORT PEN 31G X 8 MM MISC USE DAILY WITH VICTOZA AND/OR NOVOLOG. 400 each 2   benzonatate (TESSALON) 100 MG capsule Take 1 capsule (100 mg total) by mouth 3  (three) times daily as needed for cough. 30 capsule 0   Blood Glucose Calibration (TRUE METRIX LEVEL 1) Low SOLN Use as directed  Dx:e11.22 1 each 2   Blood Glucose Monitoring Suppl (TRUE METRIX METER) w/Device KIT USE AS DIRECTED 1 kit 1   carvedilol (COREG) 12.5 MG tablet Take 1 tablet (12.5 mg total) by mouth 2 (two) times daily. 180 tablet 3   dexlansoprazole (DEXILANT) 60 MG capsule Take 1 capsule (60 mg total) by mouth daily. 90 capsule 2   diclofenac sodium (VOLTAREN) 1 % GEL Apply 2 g topically 4 (four) times daily as needed (pain).     ferrous sulfate 324 MG TBEC Patient takes 1 tablet by mouth every other day.     furosemide (LASIX) 20 MG tablet TAKE 4 TABLETS (80 MG) BY MOUTH IN THE MORNING AND 3 TABLETS (60 MG) EVERY EVENING. 210 tablet 6   Glucagon (GVOKE HYPOPEN 2-PACK) 0.5 MG/0.1ML SOAJ INJECT 0.5 MG INTO THE SKIN DAILY AS NEEDED. 2 mL 3   guaiFENesin-dextromethorphan (ROBITUSSIN DM) 100-10 MG/5ML syrup Take 10 mLs by mouth every 6 (six) hours as needed for cough. (Patient not taking: Reported on 03/15/2022) 118 mL 0   hydrALAZINE (APRESOLINE) 100 MG tablet Take 1 tablet (100 mg total) by mouth 3 (three) times daily. 270 tablet 3   hydrocortisone cream 1 % Apply 1 application topically daily as needed for itching.      insulin glargine, 1 Unit Dial, (TOUJEO SOLOSTAR) 300 UNIT/ML Solostar Pen INJECT 36 UNITS INTO THE SKIN AT BEDTIME. 9 mL 10   insulin lispro (HUMALOG KWIKPEN) 100 UNIT/ML KwikPen Inject 0.02-0.04 mLs (2-4 Units total) into the skin 3 (three) times daily as needed (high blood sugar over 150). 15 mL 2   Isopropyl Alcohol (ALCOHOL WIPES) 70 % MISC Apply 1 each topically 2 (two) times daily. 300 each 3   isosorbide mononitrate (IMDUR) 30 MG 24 hr tablet TAKE 1 TABLET EVERY DAY 90 tablet 2   lamoTRIgine (LAMICTAL) 150 MG tablet Take 1 tablet (150 mg total) by mouth 2 (two) times daily. 180 tablet 3   levETIRAcetam (KEPPRA) 500 MG tablet Take 2 tablets every night 180 tablet 3    ondansetron (ZOFRAN) 4 MG tablet Take 1 tablet (4 mg total) by mouth every 8 (eight) hours as needed for nausea or vomiting. 20 tablet 1   OZEMPIC, 1 MG/DOSE, 4 MG/3ML SOPN INJECT 1 MG INTO THE SKIN ONCE A WEEK. 9 mL 3   Pancrelipase, Lip-Prot-Amyl, 25000-79000 units CPEP Take  1-2 capsules by mouth See admin instructions. Take 1 capsules in the morning with breakfast, 1 capsule with lunch, and 2 capsules with supper     potassium chloride SA (KLOR-CON M) 20 MEQ tablet Take 2 tablets (40 mEq total) by mouth 2 (two) times daily. With additional 60 on Metolazone days 120 tablet 0   sennosides-docusate sodium (SENOKOT-S) 8.6-50 MG tablet Take 1-2 tablets by mouth at bedtime as needed for constipation.     sertraline (ZOLOFT) 50 MG tablet TAKE 1 TABLET EVERY DAY 90 tablet 2   tiZANidine (ZANAFLEX) 2 MG tablet Take one tablet by mouth at bedtime as needed (Patient not taking: Reported on 06/14/2022) 30 tablet 2   tolnaftate (TINACTIN) 1 % cream Apply 1 application topically daily as needed (foot fungus).      TRUE METRIX BLOOD GLUCOSE TEST test strip USE AS DIRECTED TO CHECK BLOOD SUGARS 2 TIMES PER DAY 200 strip 10   TRUEplus Lancets 33G MISC TEST BLOOD SUGAR TWICE DAILY AS DIRECTED 200 each 3     ROS:                                                                                                                                       As per HPI. Detailed ROS deferred in the context of acuity of presentation.    There were no vitals taken for this visit.   General Examination:                                                                                                       Physical Exam  HEENT-  NCAT    Lungs- Respirations unlabored Extremities- No edema  Neurological Examination Mental Status: Alert, oriented x 5, thought content appropriate.  Speech fluent without evidence of aphasia. Has increased latencies of verbal responses at times with slow speech that has a quality suggestive of  embellishment.  Able to follow all commands without difficulty. Cranial Nerves: II: Visual fields intact with no extinction to DSS. PERRL  III,IV, VI: No ptosis. EOMI.  V: FT sensation subjectively decreased on the left.   VII: Smile symmetric VIII: Hearing intact to voice IX,X: No hoarseness XI: Symmetric shoulder shrug XII: Midline tongue extension Motor: RUE and RLE 5/5 LUE 4/5 with bobbing drift and giveway weakness. Responds to coaching. LLE 5/5  Sensory: Decreased FT to LLE and LUE. No extinction to DSS.  Deep Tendon Reflexes: 2+ and symmetric throughout Cerebellar: No ataxia with FNF bilaterally  Gait:  Deferred  NIHSS: 2   Lab Results: Basic Metabolic Panel: Recent Labs  Lab 07/05/22 2232  NA 136  K 3.5  CL 101  GLUCOSE 116*  BUN 11  CREATININE 0.90    CBC: Recent Labs  Lab 07/05/22 2232  HGB 11.6*  HCT 34.0*    Cardiac Enzymes: No results for input(s): "CKTOTAL", "CKMB", "CKMBINDEX", "TROPONINI" in the last 168 hours.  Lipid Panel: No results for input(s): "CHOL", "TRIG", "HDL", "CHOLHDL", "VLDL", "LDLCALC" in the last 168 hours.  Imaging: No results found.   Assessment: 77 y.o. female with an extensive PMHx as documented below who presents to the ED via EMS as a Code Stroke after she was noted to be weak on her left side after asynchronous seizure-like activity without postictal state afterwards. Initial call to EMS was made by family when she had been acting strangely, getting into her bed with her clothes on, complaining of feeling ill and then becoming SOB. First responders noted 3 episodes of brief seizure like activity. On arrival by EMS, the end of one of the seizure-like spells was noted, without any following postictal state. En route she had another seizure-like episode consisting of asynchronous flopping/flailing movements of all 4 of her limbs and her head for about 10 seconds, followed by left sided weakness with motor testing by EMS as well as  questionable left facial droop. There was no postictal confusion immediately after the flailing movements stopped. Vitals per EMS: 156/80, HR 70's. She does have a diagnosis of seizure disorder and is on Lamictal and Keppra at home, prescribed by Dr. Delice Lesch.  - Exam reveals subjective left sided sensory numbness and LUE giveway weakness without facial droop or LLE weakness. Left arm weakness responds to coaching. No clinical seizure activity noted at the time of examination.  - CT head: No acute intracranial abnormality. ASPECTS is 10. Small remote right frontal cortical infarct with underlying mild chronic small vessel ischemic disease.  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   Recommendations: - Management of possible respiratory abnormalities per ED - Frequent neuro checks - MRI brain to rule out stroke. No other stroke work up is indicated unless MRI brain is positive.  - Continue her home Lamictal and Keppra.  - ASA 81 mg po qd    Electronically signed: Dr. Kerney Elbe 07/05/2022, 10:35 PM

## 2022-07-05 NOTE — ED Notes (Signed)
Called lab to add on additional ordered labs to existing blood in lab.

## 2022-07-05 NOTE — ED Triage Notes (Signed)
Patient arrives from home via EMS as code stroke. Per family LKW was 1800. Patient with left sided weakness and per family patient had witnessed seizure.

## 2022-07-06 ENCOUNTER — Encounter (HOSPITAL_COMMUNITY): Payer: Self-pay

## 2022-07-06 ENCOUNTER — Emergency Department (HOSPITAL_COMMUNITY): Payer: Medicare HMO

## 2022-07-06 DIAGNOSIS — I484 Atypical atrial flutter: Secondary | ICD-10-CM | POA: Diagnosis not present

## 2022-07-06 DIAGNOSIS — Z7901 Long term (current) use of anticoagulants: Secondary | ICD-10-CM

## 2022-07-06 DIAGNOSIS — I639 Cerebral infarction, unspecified: Secondary | ICD-10-CM | POA: Diagnosis not present

## 2022-07-06 DIAGNOSIS — Z5181 Encounter for therapeutic drug level monitoring: Secondary | ICD-10-CM

## 2022-07-06 DIAGNOSIS — I6782 Cerebral ischemia: Secondary | ICD-10-CM | POA: Diagnosis not present

## 2022-07-06 LAB — RAPID URINE DRUG SCREEN, HOSP PERFORMED
Amphetamines: NOT DETECTED
Barbiturates: NOT DETECTED
Benzodiazepines: NOT DETECTED
Cocaine: NOT DETECTED
Opiates: NOT DETECTED
Tetrahydrocannabinol: NOT DETECTED

## 2022-07-06 LAB — URINALYSIS, ROUTINE W REFLEX MICROSCOPIC
Bilirubin Urine: NEGATIVE
Glucose, UA: NEGATIVE mg/dL
Hgb urine dipstick: NEGATIVE
Ketones, ur: NEGATIVE mg/dL
Leukocytes,Ua: NEGATIVE
Nitrite: NEGATIVE
Protein, ur: NEGATIVE mg/dL
Specific Gravity, Urine: 1.006 (ref 1.005–1.030)
pH: 6 (ref 5.0–8.0)

## 2022-07-06 LAB — TROPONIN I (HIGH SENSITIVITY)
Troponin I (High Sensitivity): 5 ng/L (ref <18)
Troponin I (High Sensitivity): 8 ng/L (ref <18)

## 2022-07-06 LAB — RESP PANEL BY RT-PCR (RSV, FLU A&B, COVID)  RVPGX2
Influenza A by PCR: NEGATIVE
Influenza B by PCR: NEGATIVE
Resp Syncytial Virus by PCR: NEGATIVE
SARS Coronavirus 2 by RT PCR: NEGATIVE

## 2022-07-06 LAB — BRAIN NATRIURETIC PEPTIDE: B Natriuretic Peptide: 167.5 pg/mL — ABNORMAL HIGH (ref 0.0–100.0)

## 2022-07-06 MED ORDER — APIXABAN 5 MG PO TABS
5.0000 mg | ORAL_TABLET | Freq: Once | ORAL | Status: AC
  Administered 2022-07-06: 5 mg via ORAL
  Filled 2022-07-06: qty 1

## 2022-07-06 MED ORDER — APIXABAN 5 MG PO TABS: 5.0000 mg | ORAL_TABLET | Freq: Once | ORAL | Status: DC

## 2022-07-06 MED ORDER — APIXABAN 5 MG PO TABS: 5.0000 mg | ORAL_TABLET | Freq: Two times a day (BID) | ORAL | 0 refills | Status: DC

## 2022-07-06 NOTE — ED Provider Notes (Signed)
Signout from [employer] at shift change. Patient has h/o hypertension, diabetes, hyperlipidemia, diastolic heart failure, Parkinson's disease, chronic kidney disease, COPD, epilepsy. Briefly, patient presents for stroke/seizure. Seen by neuro and cleared. No changes on MRI today.    Plan: D/c home but need reccs from cardiology regarding anticoagulation as patient she has h/o diverticular bleeding 2022.   8:48 AM Reassessment performed. Patient appears stable.   Labs and imaging personally reviewed and interpreted including: CBC hemoglobin 10.6 currently which is near her baseline, perhaps slightly above baseline; CMP unremarkable; troponins normal; UA unremarkable; BNP minimally elevated.   Most current vital signs reviewed and are as follows: BP (!) 124/103   Pulse 79   Temp 98.2 F (36.8 C)   Resp 15   Ht '5\' 1"'$  (1.549 m)   Wt 82.4 kg   SpO2 93%   BMI 34.32 kg/m   Plan: Asked by previous team to discuss anticoagulation decision due to possible new onset afib with cardiology team.   I have spoken with Dr. Johnsie Cancel regarding this, he reccs initiating eliquis '5mg'$  bid, having patient follow-up with her cardiologist, Dr. [name].     Return and follow-up instructions: Encouraged return to ED with bleeding sx. Encouraged patient to follow-up with their provider in 3 days. Patient verbalized understanding and agreed with plan.      Carlisle Cater, PA-C 07/06/22 5009    Valarie Merino, MD 07/06/22 1538

## 2022-07-06 NOTE — Progress Notes (Signed)
Heart Failure Navigator Progress Note  Assessed for Heart & Vascular TOC clinic readiness.  Patient does not meet criteria due to Advanced Heart Failure Team patient.   Navigator will sign off at this time.    Neziah Braley, BSN, RN Heart Failure Nurse Navigator Secure Chat Only   

## 2022-07-06 NOTE — Consult Note (Signed)
CARDIOLOGY CONSULT NOTE       Patient ID: [name] MRN: [id number] DOB/AGE: 75-Jul-1947 75 y.o.  Admit date: 07/05/2022 Referring Physician: Tyrone Nine Primary Physician: [name], MD Primary Cardiologist: [name] Reason for Consultation: Aflutter  Active Problems:   * No active hospital problems. *   HPI:  77 y.o. seen in ED at request of Dr Tyrone Nine for new onset atrial flutter and anticoagulation management . Patient had a diverticular bleed 03/2021 Colonoscopy at that time with polyp removed diverticula no active bleeding and Hemorids she has not had any issues with bleeding since Hct is good at 38 PLT 252 and normal PT/PTT Telemetry with atypical flutter Rate controlled in 70 range already on coreg. Patient has hemoccult cards from primary already and was supposed to send sample in last month   She has OSA and uses CPAP Normal cors on cath 03/2019 with some RV dysfunction moderate pulmonary HTN Had Cardiomems placed May 2021  She uses a cane for balance but no falls Prior ECG;s have shown NSR with ICRBBB   ROS All other systems reviewed and negative except as noted above  Past Medical History:  Diagnosis Date   Abnormal liver function     in the past.   Anemia    Arthritis    all over   Bruises easily    Cataract    right eye;immature   CHF (congestive heart failure) ([place])    Chronic back pain    stenosis   Chronic cough    Chronic kidney disease    COPD (chronic obstructive pulmonary disease) ([place])    COVID    aug/sept 2021   [place] myocardial infarction [facility]) 2012   [place] Infarction in setting of Pancreatitis --> mild Troponin elevation, NON-OBSTRUCTIVE CAD   Depression    takes Abilify daily as well as Zoloft   Diastolic heart failure 4098   Grade 1 diastolic Dysfunction by Echo    Diverticulosis    DM (diabetes mellitus) ([place])    takes Victoza daily as well as Lantus and Humalog   Empty sella ([place])    on MRI in 2009.   Glaucoma    Headache(784.0)     last migraine-4-59yr ago   History of blood transfusion    no abnormal reaction noted   History of colon polyps    benign   HTN (hypertension)    takes Benicar,Imdur,and Bystolic daily   Hyperlipidemia    takes Lipitor daily   Joint swelling    Nocturia    Obesity hypoventilation syndrome ([place]    Obstructive sleep apnea 02/2018   Notably improved split-night study with weight loss from 270 (2017) down to 250 pounds (2019).   Pancreatitis    takes Pancrelipase daily   Parkinson's disease    takes Sinemet daily   Peripheral neuropathy    Pneumonia 2012   Seizures ([place]    takes Lamictal daily and Primidone nightly;last seizure 2wks ago   Urinary urgency    With increased frequency   Varicose veins of both lower extremities with pain    With edema.  Takes daily Lasix    Family History  Problem Relation Age of Onset   Allergies Father    Heart disease Father        before 657  Heart failure Father    Hypertension Father    Hyperlipidemia Father    Heart disease Mother    Diabetes Mother    Hypertension Mother    Hyperlipidemia  Mother    Hypertension Sister    Heart disease Sister        before 73   Hyperlipidemia Sister    Diabetes Brother    Hypertension Brother    Diabetes Sister    Hypertension Sister    Diabetes Son    Hypertension Son    Cancer Other     Social History   Socioeconomic History   Marital status: Divorced    Spouse name: Not on file   Number of children: Not on file   Years of education: Not on file   Highest education level: Not on file  Occupational History   Occupation: Disabled    Comment: CNA  Tobacco Use   Smoking status: Former    Packs/day: 0.50    Years: 25.00    Total pack years: 12.50    Types: Cigarettes   Smokeless tobacco: Never   Tobacco comments:    quit smoking 20+ytrs ago  Vaping Use   Vaping Use: Never used  Substance and Sexual Activity   Alcohol use: No   Drug use: No   Sexual activity: Not Currently     Birth control/protection: Surgical  Other Topics Concern   Not on file  Social History Narrative   She lives in [place]. She has lots of family in the area and is accompanied by her sister.   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   She has a distant history of smoking, quit 20 years ago.   Right handed    Social Determinants of Health   Financial Resource Strain: Low Risk  (07/06/2021)   Overall Financial Resource Strain (CARDIA)    Difficulty of Paying Living Expenses: Not hard at all  Food Insecurity: No Food Insecurity (07/06/2021)   Hunger Vital Sign    Worried About Running Out of Food in the Last Year: Never true    Ran Out of Food in the Last Year: Never true  Transportation Needs: No Transportation Needs (07/06/2021)   PRAPARE - Hydrologist (Medical): No    Lack of Transportation (Non-Medical): No  Recent Concern: Transportation Needs - Unmet Transportation Needs (04/26/2021)   PRAPARE - Transportation    Lack of Transportation (Medical): Yes    Lack of Transportation (Non-Medical): Yes  Physical Activity: Inactive (07/06/2021)   Exercise Vital Sign    Days of Exercise per Week: 0 days    Minutes of Exercise per Session: 0 min  Stress: No Stress Concern Present (07/06/2021)   [place]    Feeling of Stress : Only a little  Social Connections: Not on file  Intimate Partner Violence: Not At Risk (10/22/2018)   Humiliation, Afraid, Rape, and Kick questionnaire    Fear of Current or Ex-Partner: No    Emotionally Abused: No    Physically Abused: No    Sexually Abused: No    Past Surgical History:  Procedure Laterality Date   ABDOMINAL HYSTERECTOMY     APPENDECTOMY     BACK SURGERY     Cardiac Event Monitor  September-October 2017   Sinus rhythm with occasional PACs and artifact. No arrhythmias besides one short run of tachycardia.    CHOLECYSTECTOMY     COLONOSCOPY N/A 08/15/2012   Procedure: COLONOSCOPY;  Surgeon: Arta Silence, MD;  Location: [facility];  Service: Endoscopy;  Laterality: N/A;   COLONOSCOPY WITH PROPOFOL Left 06/17/2017   Procedure: COLONOSCOPY  WITH PROPOFOL;  Surgeon: [name], MD;  Location: [place];  Service: Gastroenterology;  Laterality: Left;   COLONOSCOPY WITH PROPOFOL N/A 04/03/2021   Procedure: COLONOSCOPY WITH PROPOFOL;  Surgeon: Clarene Essex, MD;  Location: [place];  Service: Endoscopy;  Laterality: N/A;   CORONARY CALCIUM SCORE AND CTA  04/2018   Coronary calcium score 71.9.  Very large, hyperdynamic LAD wrapping apex giving rise to PDA.  Moderate LAD-diagonal and circumflex plaque -> CT FFR suggested positive findings in distal D1, D2 and distal circumflex.  Referred for cath. ==> FALSE POSITIVE   ESOPHAGOGASTRODUODENOSCOPY     eye cysts Bilateral    LASIK     LEFT HEART CATH AND CORONARY ANGIOGRAPHY N/A 05/16/2018   Procedure: LEFT HEART CATH AND CORONARY ANGIOGRAPHY;  Surgeon: Belva Crome, MD;  Location: [facility] INVASIVE CV LAB:  Angiographically normal coronary arteries with LVEDP of 21 mmHg.  Large draping hyperdominant LAD that wraps the apex and provides distal half of the PDA.  Relatively small caliber distal Cx -->proxLPDA, non-dom RCA. No Cx or Diag lesions -- FALSE + CT FFR   LEFT HEART CATH AND CORONARY ANGIOGRAPHY  2009/March 2012   2009: (Dr. Alvie Heidelberg) Nonobstructive CAD; 2012: Minimal CAD --> false positive stress test   NM MYOVIEW LTD  01/2016; 01/2018   a)LOW RISK. No ischemia or infarction;; b) EF 55-60%. NO ST changes.  No ischemia or Infarction. NO significant RV enlargement.  LOW RISK.   POLYPECTOMY  04/03/2021   Procedure: POLYPECTOMY;  Surgeon: Clarene Essex, MD;  Location: [place];  Service: Endoscopy;;   Polysomnogram  02/2018   (Dr. Brett Fairy from neurology): Split study was not adequately done due to low AHI.  She slept reclined and had an AHI less than 10,  prolonged hypoxemia and no hypercapnia. -->  She has home oxygen already prescribed, but did not meet criteria for nightly oxygen.  (Was noted that the patient did not cooperate well with study.  Titration was discussed.   PRESSURE SENSOR/CARDIOMEMS N/A 11/09/2019   Procedure: PRESSURE SENSOR/CARDIOMEMS;  Surgeon: Larey Dresser, MD;  Location: [place] [facility];  Service: Cardiovascular;  Laterality: N/A;   RECTAL POLYPECTOMY     RIGHT/LEFT HEART CATH AND CORONARY ANGIOGRAPHY N/A 04/06/2019   Procedure: RIGHT/LEFT HEART CATH AND CORONARY ANGIOGRAPHY;  Surgeon: Leonie Man, MD;  [facility] CV LAB; angiographically normal coronary arteries.  Moderately elevated AD LVEDP..  Mild pulmonary hypertension: PA P 56/14 mmHg-mean 31 mmHg.  RAP 10 mmHg.  RV P-EDP 54/4 mmHg - 12 mmHg.  PCWP 11-15 mmHg.  CO-CI: 7.35-3.73.   TONSILLECTOMY     TRANSTHORACIC ECHOCARDIOGRAM  01/2016; 05/2017   A) Normal LV chamber size with mod LVH pattern. EF 50-55%. Severe LA dilation. Mod RA dilation. PAP elevated at 38 mmHg;; B) Mild concentric LVH.  EF 55-60% & no RWMA.  Grade 2 DD.  Diastolic flattening of the ventricular septum == ? elevated PAP.  Mild aortic valve calcification/sclerosis.  Mod LA dilation.  Mild RV dilation & Mod RA dilation   TRANSTHORACIC ECHOCARDIOGRAM  10/2017; 01/2018   a) EF 55-60%.  No RWMA,  Moderate LA dilation.  Mild LA dilation.  Peak PA pressures ~57 mmHg (moderate);;; b) EF 55-60%.  GRII DD.  Suggestion of RV volume overload with moderate RV dilation.  Severe LA and RA dilation.Marland Kitchen  PA pressure ~67%.   TRANSTHORACIC ECHOCARDIOGRAM  01/2019   Normal LV size and function.  EF 50 to 55%.  Impaired relaxation.  RV appears to have moderately reduced function.  Severely enlarged.  Cannot fully assess RV pressures.  Hypermobile interatrial septum.  Right atrium severely dilated.   TUBAL LIGATION     vaginal cyst removed     several times      Current Facility-Administered Medications:     apixaban (ELIQUIS) tablet 5 mg, 5 mg, Oral, Once, Geiple, Joshua, PA-C   sodium chloride flush (NS) 0.9 % injection 3 mL, 3 mL, Intravenous, Once, Lajean Saver, MD  Current Outpatient Medications:    apixaban (ELIQUIS) 5 MG TABS tablet, Take 1 tablet (5 mg total) by mouth 2 (two) times daily., Disp: 60 tablet, Rfl: 0   acetaminophen (TYLENOL) 500 MG tablet, Take 500-1,000 mg by mouth every 4 (four) hours as needed for moderate pain. , Disp: , Rfl:    albuterol (VENTOLIN HFA) 108 (90 Base) MCG/ACT inhaler, Inhale 1-2 puffs into the lungs every 6 (six) hours as needed for wheezing or shortness of breath., Disp: 18 g, Rfl: 5   Alcohol Swabs (DROPSAFE ALCOHOL PREP) 70 % PADS, USE TWO TIMES DAILY AS DIRECTED, Disp: 200 each, Rfl: 6   Ascorbic Acid (VITAMIN C) 1000 MG tablet, Take 1,000 mg by mouth daily., Disp: , Rfl:    atorvastatin (LIPITOR) 10 MG tablet, TAKE 1 TABLET EVERY EVENING, Disp: 90 tablet, Rfl: 2   B-D ULTRAFINE III SHORT PEN 31G X 8 MM MISC, USE DAILY WITH VICTOZA AND/OR NOVOLOG., Disp: 400 each, Rfl: 2   benzonatate (TESSALON) 100 MG capsule, Take 1 capsule (100 mg total) by mouth 3 (three) times daily as needed for cough., Disp: 30 capsule, Rfl: 0   Blood Glucose Calibration (TRUE METRIX LEVEL 1) Low SOLN, Use as directed  Dx:e11.22, Disp: 1 each, Rfl: 2   Blood Glucose Monitoring Suppl (TRUE METRIX METER) w/Device KIT, USE AS DIRECTED, Disp: 1 kit, Rfl: 1   carvedilol (COREG) 12.5 MG tablet, Take 1 tablet (12.5 mg total) by mouth 2 (two) times daily., Disp: 180 tablet, Rfl: 3   dexlansoprazole (DEXILANT) 60 MG capsule, Take 1 capsule (60 mg total) by mouth daily., Disp: 90 capsule, Rfl: 2   diclofenac sodium (VOLTAREN) 1 % GEL, Apply 2 g topically 4 (four) times daily as needed (pain)., Disp: , Rfl:    ferrous sulfate 324 MG TBEC, Patient takes 1 tablet by mouth every other day., Disp: , Rfl:    furosemide (LASIX) 20 MG tablet, TAKE 4 TABLETS (80 MG) BY MOUTH IN THE MORNING AND 3  TABLETS (60 MG) EVERY EVENING., Disp: 210 tablet, Rfl: 6   Glucagon (GVOKE HYPOPEN 2-PACK) 0.5 MG/0.1ML SOAJ, INJECT 0.5 MG INTO THE SKIN DAILY AS NEEDED., Disp: 2 mL, Rfl: 3   guaiFENesin-dextromethorphan (ROBITUSSIN DM) 100-10 MG/5ML syrup, Take 10 mLs by mouth every 6 (six) hours as needed for cough. (Patient not taking: Reported on 03/15/2022), Disp: 118 mL, Rfl: 0   hydrALAZINE (APRESOLINE) 100 MG tablet, Take 1 tablet (100 mg total) by mouth 3 (three) times daily., Disp: 270 tablet, Rfl: 3   hydrocortisone cream 1 %, Apply 1 application topically daily as needed for itching. , Disp: , Rfl:    insulin glargine, 1 Unit Dial, (TOUJEO SOLOSTAR) 300 UNIT/ML Solostar Pen, INJECT 36 UNITS INTO THE SKIN AT BEDTIME., Disp: 9 mL, Rfl: 10   insulin lispro (HUMALOG KWIKPEN) 100 UNIT/ML KwikPen, Inject 0.02-0.04 mLs (2-4 Units total) into the skin 3 (three) times daily as needed (high blood sugar over 150)., Disp: 15 mL, Rfl: 2  Isopropyl Alcohol (ALCOHOL WIPES) 70 % MISC, Apply 1 each topically 2 (two) times daily., Disp: 300 each, Rfl: 3   isosorbide mononitrate (IMDUR) 30 MG 24 hr tablet, TAKE 1 TABLET EVERY DAY, Disp: 90 tablet, Rfl: 2   lamoTRIgine (LAMICTAL) 150 MG tablet, Take 1 tablet (150 mg total) by mouth 2 (two) times daily., Disp: 180 tablet, Rfl: 3   levETIRAcetam (KEPPRA) 500 MG tablet, Take 2 tablets every night, Disp: 180 tablet, Rfl: 3   ondansetron (ZOFRAN) 4 MG tablet, Take 1 tablet (4 mg total) by mouth every 8 (eight) hours as needed for nausea or vomiting., Disp: 20 tablet, Rfl: 1   OZEMPIC, 1 MG/DOSE, 4 MG/3ML SOPN, INJECT 1 MG INTO THE SKIN ONCE A WEEK., Disp: 9 mL, Rfl: 3   Pancrelipase, Lip-Prot-Amyl, 25000-79000 units CPEP, Take 1-2 capsules by mouth See admin instructions. Take 1 capsules in the morning with breakfast, 1 capsule with lunch, and 2 capsules with supper, Disp: , Rfl:    potassium chloride SA (KLOR-CON M) 20 MEQ tablet, Take 2 tablets (40 mEq total) by mouth 2 (two)  times daily. With additional 60 on Metolazone days, Disp: 120 tablet, Rfl: 0   sennosides-docusate sodium (SENOKOT-S) 8.6-50 MG tablet, Take 1-2 tablets by mouth at bedtime as needed for constipation., Disp: , Rfl:    sertraline (ZOLOFT) 50 MG tablet, TAKE 1 TABLET EVERY DAY, Disp: 90 tablet, Rfl: 2   tiZANidine (ZANAFLEX) 2 MG tablet, Take one tablet by mouth at bedtime as needed (Patient not taking: Reported on 06/14/2022), Disp: 30 tablet, Rfl: 2   tolnaftate (TINACTIN) 1 % cream, Apply 1 application topically daily as needed (foot fungus). , Disp: , Rfl:    TRUE METRIX BLOOD GLUCOSE TEST test strip, USE AS DIRECTED TO CHECK BLOOD SUGARS 2 TIMES PER DAY, Disp: 200 strip, Rfl: 10   TRUEplus Lancets 33G MISC, TEST BLOOD SUGAR TWICE DAILY AS DIRECTED, Disp: 200 each, Rfl: 3  apixaban  5 mg Oral Once   sodium chloride flush  3 mL Intravenous Once     Physical Exam: Blood pressure 130/76, pulse 75, temperature 98.2 F (36.8 C), resp. rate 14, height '5\' 1"'$  (1.549 m), weight 82.4 kg, SpO2 94 %.    Black female mild cough Lungs atelectasis at base JVP mildly elevated SEM Abdomen benign Trace edema Neuro non focal  Palpable pedal pulses   Labs:   Lab Results  Component Value Date   WBC 5.0 07/05/2022   HGB 11.6 (L) 07/05/2022   HCT 34.0 (L) 07/05/2022   MCV 81.0 07/05/2022   PLT 252 07/05/2022    Recent Labs  Lab 07/05/22 2224 07/05/22 2232  NA 135 136  K 3.5 3.5  CL 100 101  CO2 22  --   BUN 11 11  CREATININE 0.92 0.90  CALCIUM 8.9  --   PROT 7.8  --   BILITOT 0.5  --   ALKPHOS 86  --   ALT 11  --   AST 18  --   GLUCOSE 117* 116*   Lab Results  Component Value Date   CKTOTAL 60 12/31/2010   CKMB 1.0 12/31/2010   TROPONINI 0.29 (HH) 05/15/2018    Lab Results  Component Value Date   CHOL 145 05/04/2021   CHOL 144 06/20/2020   CHOL 150 12/17/2019   Lab Results  Component Value Date   HDL 79 05/04/2021   HDL 63 06/20/2020   HDL 76 12/17/2019   Lab Results   Component  Value Date   LDLCALC 63 05/04/2021   LDLCALC 70 06/20/2020   LDLCALC 65 12/17/2019   Lab Results  Component Value Date   TRIG 16 05/04/2021   TRIG 55 06/20/2020   TRIG 47 12/17/2019   Lab Results  Component Value Date   CHOLHDL 1.8 05/04/2021   CHOLHDL 2.3 06/20/2020   CHOLHDL 2.0 12/17/2019   Lab Results  Component Value Date   LDLDIRECT 66.0 01/27/2015   LDLDIRECT 146.1 05/06/2013      Radiology: MR BRAIN WO CONTRAST  Result Date: 07/06/2022 CLINICAL DATA:  Initial evaluation for neuro deficit, stroke suspected. EXAM: MRI HEAD WITHOUT CONTRAST TECHNIQUE: Multiplanar, multiecho pulse sequences of the brain and surrounding structures were obtained without intravenous contrast. COMPARISON:  Prior CT from earlier the same day. FINDINGS: Brain: Cerebral volume within normal limits. Small remote cortical infarct present at the posterior right frontal region. Underlying mild chronic microvascular ischemic disease for age. No evidence for acute or subacute ischemia. Gray-white matter differentiation maintained. No acute or chronic intracranial blood products. No mass lesion, midline shift or mass effect. No hydrocephalus or extra-axial fluid collection. Empty sella noted. Vascular: Major intracranial vascular flow voids are maintained. Skull and upper cervical spine: Craniocervical junction within normal limits. Bone marrow signal intensity grossly normal. No scalp soft tissue abnormality. Sinuses/Orbits: Prior bilateral ocular lens replacement. Paranasal sinuses are largely clear. No mastoid effusion. Other: None. IMPRESSION: 1. No acute intracranial abnormality. 2. Small remote cortical infarct involving the posterior right frontal region. 3. Underlying mild chronic microvascular ischemic disease for age. 4. Empty sella. While this finding is often incidental in nature and of no clinical significance, this can also be seen in the setting of idiopathic intracranial hypertension.  Electronically Signed   By: Jeannine Boga M.D.   On: 07/06/2022 00:50   DG Chest Portable 1 View  Result Date: 07/05/2022 CLINICAL DATA:  Rales and shortness of breath EXAM: PORTABLE CHEST 1 VIEW COMPARISON:  12/28/2021 FINDINGS: Stable cardiomegaly. Prominent central pulmonary arteries. Aortic atherosclerotic calcification. Bibasilar atelectasis or infiltrates. No definite pleural effusion. No pneumothorax. IMPRESSION: Bibasilar atelectasis or infiltrates. Cardiomegaly Electronically Signed   By: Placido Sou M.D.   On: 07/05/2022 23:28   CT HEAD CODE STROKE WO CONTRAST  Result Date: 07/05/2022 CLINICAL DATA:  Code stroke. Initial evaluation for neuro deficit, stroke suspected. EXAM: CT HEAD WITHOUT CONTRAST TECHNIQUE: Contiguous axial images were obtained from the base of the skull through the vertex without intravenous contrast. RADIATION DOSE REDUCTION: This exam was performed according to the departmental dose-optimization program which includes automated exposure control, adjustment of the mA and/or kV according to patient size and/or use of iterative reconstruction technique. COMPARISON:  Comparison made with prior study from 12/28/2021. FINDINGS: Brain: Cerebral volume within normal limits. Mild chronic small vessel ischemic disease. Small remote right frontal cortical infarct. No acute intracranial hemorrhage. No acute large vessel territory infarct. No mass lesion, midline shift or mass effect. No hydrocephalus or extra-axial fluid collection. Vascular: No abnormal hyperdense vessel. Scattered vascular calcifications noted within the carotid siphons. Skull: Scalp soft tissues and calvarium within normal limits. Sinuses/Orbits: Globes orbital soft tissues within normal limits. Paranasal sinuses are clear. No mastoid effusion. Other: None. ASPECTS [facility] Stroke Program Early CT Score) - Ganglionic level infarction (caudate, lentiform nuclei, internal capsule, insula, M1-M3 cortex): 7 -  Supraganglionic infarction (M4-M6 cortex): 3 Total score (0-10 with 10 being normal): 10 IMPRESSION: 1. No acute intracranial abnormality. 2. ASPECTS is 10. 3. Small remote right frontal cortical  infarct with underlying mild chronic small vessel ischemic disease. These results were communicated to Dr. Cheral Marker at 10:48 pm on 07/05/2022 by text page via the [facility] messaging system. Electronically Signed   By: Jeannine Boga M.D.   On: 07/05/2022 22:54    EKG: atypical flutter    ASSESSMENT AND PLAN:   Atrial flutter came to ER with stroke like symptoms that have resolved Found to be in atypical rate controlled flutter MRI brain no acute findings Cleared for d/c by neuro. Continue coreg add eliquis 5 bid Prior ? Diverticular bleed quiescent and patient has hemoccult cards Hct 38 Will arrange f/u with Dr [name] consider [facility] in 3 weeks If she has any issues with recurrent GI bleed would consider Watchman device CHF:  mostly right sided euvolemic CXR fine BNP only 167 continue CPAP use and current GDMT DM:  continue current meds including Ozempic for weight loss Seizures:  on Keppra non recently MRI negative   Signed: Jenkins Rouge 07/06/2022, 9:01 AM

## 2022-07-06 NOTE — Discharge Instructions (Addendum)
Your work-up today did not show any strokes.  There is no reason to be admitted for this today.  You do have some irregular heartbeat.  Cardiology has recommended starting you on a blood thinner.  This may make you bruise more easily and put you at higher risk of bleeding.  If you hit your head or injure yourself, you need to return to the emergency department for evaluation.  Also if you note blood in the stool, black stools, blood in the urine, or other source of bleeding, please stop taking the medication and call your doctor or return to the emergency department.  Call your cardiologist to schedule follow-up appointment in the next several days.  ________________________________________________________________________________________________________________________________________ Information on my medicine - ELIQUIS (apixaban)  This medication education was reviewed with me or my healthcare representative as part of my discharge preparation.  The pharmacist that spoke with me during my hospital stay was:  Kaleen Mask, [facility]  Why was Eliquis prescribed for you? Eliquis was prescribed for you to reduce the risk of a blood clot forming that can cause a stroke if you have a medical condition called atrial fibrillation (a type of irregular heartbeat).  What do You need to know about Eliquis ? Take your Eliquis TWICE DAILY - one tablet in the morning and one tablet in the evening with or without food. If you have difficulty swallowing the tablet whole please discuss with your pharmacist how to take the medication safely.  Take Eliquis exactly as prescribed by your doctor and DO NOT stop taking Eliquis without talking to the doctor who prescribed the medication.  Stopping may increase your risk of developing a stroke.  Refill your prescription before you run out.  After discharge, you should have regular check-up appointments with your healthcare provider that is prescribing your Eliquis.   In the future your dose may need to be changed if your kidney function or weight changes by a significant amount or as you get older.  What do you do if you miss a dose? If you miss a dose, take it as soon as you remember on the same day and resume taking twice daily.  Do not take more than one dose of ELIQUIS at the same time to make up a missed dose.  Important Safety Information A possible side effect of Eliquis is bleeding. You should call your healthcare provider right away if you experience any of the following: Bleeding from an injury or your nose that does not stop. Unusual colored urine (red or dark brown) or unusual colored stools (red or black). Unusual bruising for unknown reasons. A serious fall or if you hit your head (even if there is no bleeding).  Some medicines may interact with Eliquis and might increase your risk of bleeding or clotting while on Eliquis. To help avoid this, consult your healthcare provider or pharmacist prior to using any new prescription or non-prescription medications, including herbals, vitamins, non-steroidal anti-inflammatory drugs (NSAIDs) and supplements.  This website has more information on Eliquis (apixaban): http://www.eliquis.com/eliquis/home

## 2022-07-09 ENCOUNTER — Telehealth: Payer: Self-pay

## 2022-07-09 NOTE — Telephone Encounter (Signed)
Transition Care Management Unsuccessful Follow-up Telephone Call  Date of discharge and from where:  07/06/2022    Attempts:  1st Attempt  Reason for unsuccessful TCM follow-up call:  Left voice message

## 2022-07-09 NOTE — Telephone Encounter (Signed)
Transition Care Management Follow-up Telephone Call Date of discharge and from where: 07/06/2022 [facility]  How have you been since you were released from the hospital? Pt states she is doing well. She ended up staying at the ED overnight, she was not admitted.  Any questions or concerns? No  Items Reviewed: Did the pt receive and understand the discharge instructions provided? Yes  Medications obtained and verified? Yes  Other? Yes  Any new allergies since your discharge? No  Dietary orders reviewed? Yes Do you have support at home? Yes   Home Care and Equipment/Supplies: Were home health services ordered? no If so, what is the name of the agency? N/a  Has the agency set up a time to come to the patient's home? no Were any new equipment or medical supplies ordered?  No What is the name of the medical supply agency? N/a Were you able to get the supplies/equipment? no Do you have any questions related to the use of the equipment or supplies? No  Functional Questionnaire: (I = Independent and D = Dependent) ADLs: i  Bathing/Dressing- i  Meal Prep- i  Eating- i  Maintaining continence- i  Transferring/Ambulation- i  Managing Meds- i  Follow up appointments reviewed:  PCP Hospital f/u appt confirmed? Yes  Scheduled to see robyn sanders on n/a @ n/a. [facility] f/u appt confirmed? No  Scheduled to see n/a on n/a @ n/a. Are transportation arrangements needed? No  If their condition worsens, is the pt aware to call PCP or go to the Emergency Dept.? Yes Was the patient provided with contact information for the PCP's office or ED? Yes Was to pt encouraged to call back with questions or concerns? Yes

## 2022-07-11 ENCOUNTER — Encounter: Payer: Medicare HMO | Admitting: Internal Medicine

## 2022-07-11 NOTE — Progress Notes (Signed)
Cancelled - rescheduled for 07/12/22

## 2022-07-12 ENCOUNTER — Encounter: Payer: Self-pay | Admitting: Internal Medicine

## 2022-07-12 ENCOUNTER — Ambulatory Visit (INDEPENDENT_AMBULATORY_CARE_PROVIDER_SITE_OTHER): Payer: Medicare HMO | Admitting: Internal Medicine

## 2022-07-12 VITALS — BP 108/76 | HR 89 | Temp 98.1°F | Ht 61.0 in | Wt 179.8 lb

## 2022-07-12 DIAGNOSIS — I130 Hypertensive heart and chronic kidney disease with heart failure and stage 1 through stage 4 chronic kidney disease, or unspecified chronic kidney disease: Secondary | ICD-10-CM | POA: Diagnosis not present

## 2022-07-12 DIAGNOSIS — Z79899 Other long term (current) drug therapy: Secondary | ICD-10-CM | POA: Diagnosis not present

## 2022-07-12 DIAGNOSIS — Z6833 Body mass index (BMI) 33.0-33.9, adult: Secondary | ICD-10-CM

## 2022-07-12 DIAGNOSIS — I5032 Chronic diastolic (congestive) heart failure: Secondary | ICD-10-CM

## 2022-07-12 DIAGNOSIS — R2990 Unspecified symptoms and signs involving the nervous system: Secondary | ICD-10-CM

## 2022-07-12 DIAGNOSIS — I110 Hypertensive heart disease with heart failure: Secondary | ICD-10-CM

## 2022-07-12 DIAGNOSIS — E6609 Other obesity due to excess calories: Secondary | ICD-10-CM | POA: Diagnosis not present

## 2022-07-12 DIAGNOSIS — N182 Chronic kidney disease, stage 2 (mild): Secondary | ICD-10-CM | POA: Diagnosis not present

## 2022-07-12 DIAGNOSIS — E66811 Obesity, class 1: Secondary | ICD-10-CM

## 2022-07-12 DIAGNOSIS — E1122 Type 2 diabetes mellitus with diabetic chronic kidney disease: Secondary | ICD-10-CM | POA: Diagnosis not present

## 2022-07-12 DIAGNOSIS — R569 Unspecified convulsions: Secondary | ICD-10-CM | POA: Diagnosis not present

## 2022-07-12 DIAGNOSIS — Z794 Long term (current) use of insulin: Secondary | ICD-10-CM

## 2022-07-12 DIAGNOSIS — D6869 Other thrombophilia: Secondary | ICD-10-CM | POA: Diagnosis not present

## 2022-07-12 DIAGNOSIS — I4892 Unspecified atrial flutter: Secondary | ICD-10-CM

## 2022-07-12 NOTE — Progress Notes (Signed)
I,Victoria T Hamilton,acting as a scribe for [name], MD.,have documented all relevant documentation on the behalf of [name], MD,as directed by  [name], MD while in the presence of [name], MD.    Subjective:     Patient ID: [name] , female    DOB: 1945/07/01 , 77 y.o.   MRN: [id number]   Chief Complaint  Patient presents with   Follow-up    HPI  Patient presents today for hospital f/u. She went to the ER on 07/05/22 for further evaluation of abnormal behavior. She is accompanied by her sister today. She states patient was behaving abnormally, had some seizure-like episodes, then had onset of facial droop prompting EMS call.  Sister called EMS due to seizure-like behavior.  Per EMS, patient reportedly had 3 brief episodes of seizure-like activity without postictal state. ER workup included labs and CT/MRI brain.  CBC was without leukocytosis, mild anemia with hemoglobin of 10.6.  CMP unremarkable.  Troponin negative x 2.  UDS and UA unremarkable.  BNP normal.  CT head resulted w/ remote frontal cortical infarct, no acute intracranial abnormality.  MRI brain without acute intracranial abnormality, empty sella of unclear significance.  Chest x-ray with bibasilar atelectasis with infiltrates. She was found to be in aflutter/afib w/ RVR. Cardiology consulted, advised to start Eliquis.    Since discharge, she has been feeling pretty good.  She has had no recurrence of seizure like episodes. She has not experienced any palpitations since ER d/c.         Past Medical History:  Diagnosis Date   Abnormal liver function     in the past.   Anemia    Arthritis    all over   Bruises easily    Cataract    right eye;immature   CHF (congestive heart failure) (HCC)    Chronic back pain    stenosis   Chronic cough    Chronic kidney disease    COPD (chronic obstructive pulmonary disease) ([place])    COVID    aug/sept 2021   [place] myocardial infarction  [facility]) 2012   Demand Infarction in setting of Pancreatitis --> mild Troponin elevation, NON-OBSTRUCTIVE CAD   Depression    takes Abilify daily as well as Zoloft   Diastolic heart failure [id number]   Grade 1 diastolic Dysfunction by Echo    Diverticulosis    DM (diabetes mellitus) ([place])    takes Victoza daily as well as Lantus and Humalog   Empty sella ([place])    on MRI in 2009.   Glaucoma    Headache(784.0)    last migraine-4-47yr ago   History of blood transfusion    no abnormal reaction noted   History of colon polyps    benign   HTN (hypertension)    takes Benicar,Imdur,and Bystolic daily   Hyperlipidemia    takes Lipitor daily   Joint swelling    Nocturia    Obesity hypoventilation syndrome ([place]    Obstructive sleep apnea 02/2018   Notably improved split-night study with weight loss from 270 (2017) down to 250 pounds (2019).   Pancreatitis    takes Pancrelipase daily   Parkinson's disease    takes Sinemet daily   Peripheral neuropathy    Pneumonia 2012   Seizures ([place]    takes Lamictal daily and Primidone nightly;last seizure 2wks ago   Urinary urgency    With increased frequency   Varicose veins of both lower extremities with  pain    With edema.  Takes daily Lasix     Family History  Problem Relation Age of Onset   Allergies Father    Heart disease Father        before 57   Heart failure Father    Hypertension Father    Hyperlipidemia Father    Heart disease Mother    Diabetes Mother    Hypertension Mother    Hyperlipidemia Mother    Hypertension Sister    Heart disease Sister        before 69   Hyperlipidemia Sister    Diabetes Brother    Hypertension Brother    Diabetes Sister    Hypertension Sister    Diabetes Son    Hypertension Son    Cancer Other      Current Outpatient Medications:    acetaminophen (TYLENOL) 500 MG tablet, Take 500-1,000 mg by mouth every 4 (four) hours as needed for moderate pain., Disp: , Rfl:    albuterol (VENTOLIN HFA)  108 (90 Base) MCG/ACT inhaler, Inhale 1-2 puffs into the lungs every 6 (six) hours as needed for wheezing or shortness of breath., Disp: 18 g, Rfl: 5   Alcohol Swabs (DROPSAFE ALCOHOL PREP) 70 % PADS, USE TWO TIMES DAILY AS DIRECTED, Disp: 200 each, Rfl: 6   Ascorbic Acid (VITAMIN C) 1000 MG tablet, Take 1,000 mg by mouth daily., Disp: , Rfl:    atorvastatin (LIPITOR) 10 MG tablet, TAKE 1 TABLET EVERY EVENING, Disp: 90 tablet, Rfl: 2   B-D ULTRAFINE III SHORT PEN 31G X 8 MM MISC, USE DAILY WITH VICTOZA AND/OR NOVOLOG., Disp: 400 each, Rfl: 2   benzonatate (TESSALON) 100 MG capsule, Take 1 capsule (100 mg total) by mouth 3 (three) times daily as needed for cough., Disp: 30 capsule, Rfl: 0   Blood Glucose Monitoring Suppl (TRUE METRIX METER) w/Device KIT, USE AS DIRECTED, Disp: 1 kit, Rfl: 1   carvedilol (COREG) 12.5 MG tablet, Take 1 tablet (12.5 mg total) by mouth 2 (two) times daily., Disp: 180 tablet, Rfl: 3   diclofenac sodium (VOLTAREN) 1 % GEL, Apply 2 g topically 4 (four) times daily as needed (pain)., Disp: , Rfl:    ferrous sulfate 324 MG TBEC, Patient takes 1 tablet by mouth every other day., Disp: , Rfl:    Glucagon (GVOKE HYPOPEN 2-PACK) 0.5 MG/0.1ML SOAJ, INJECT 0.5 MG INTO THE SKIN DAILY AS NEEDED., Disp: 2 mL, Rfl: 3   hydrALAZINE (APRESOLINE) 100 MG tablet, Take 1 tablet (100 mg total) by mouth 3 (three) times daily., Disp: 270 tablet, Rfl: 3   hydrocortisone cream 1 %, Apply 1 application topically daily as needed for itching. , Disp: , Rfl:    insulin lispro (HUMALOG KWIKPEN) 100 UNIT/ML KwikPen, Inject 0.02-0.04 mLs (2-4 Units total) into the skin 3 (three) times daily as needed (high blood sugar over 150)., Disp: 15 mL, Rfl: 2   Isopropyl Alcohol (ALCOHOL WIPES) 70 % MISC, Apply 1 each topically 2 (two) times daily., Disp: 300 each, Rfl: 3   isosorbide mononitrate (IMDUR) 30 MG 24 hr tablet, TAKE 1 TABLET EVERY DAY, Disp: 90 tablet, Rfl: 2   lamoTRIgine (LAMICTAL) 150 MG tablet,  Take 1 tablet (150 mg total) by mouth 2 (two) times daily., Disp: 180 tablet, Rfl: 3   levETIRAcetam (KEPPRA) 500 MG tablet, Take 2 tablets every night, Disp: 180 tablet, Rfl: 3   ondansetron (ZOFRAN) 4 MG tablet, Take 1 tablet (4 mg total) by mouth every 8 (  eight) hours as needed for nausea or vomiting., Disp: 20 tablet, Rfl: 1   OZEMPIC, 1 MG/DOSE, 4 MG/3ML SOPN, INJECT 1 MG INTO THE SKIN ONCE A WEEK., Disp: 9 mL, Rfl: 3   Pancrelipase, Lip-Prot-Amyl, 25000-79000 units CPEP, Take 1-2 capsules by mouth See admin instructions. Take 2 capsules in the morning with breakfast, 1 capsule with lunch, and 2 capsules with supper, Disp: , Rfl:    potassium chloride SA (KLOR-CON M) 20 MEQ tablet, Take 2 tablets (40 mEq total) by mouth 2 (two) times daily. With additional 60 on Metolazone days, Disp: 120 tablet, Rfl: 0   sennosides-docusate sodium (SENOKOT-S) 8.6-50 MG tablet, Take 1-2 tablets by mouth at bedtime as needed for constipation., Disp: , Rfl:    sertraline (ZOLOFT) 50 MG tablet, TAKE 1 TABLET EVERY DAY, Disp: 90 tablet, Rfl: 2   tolnaftate (TINACTIN) 1 % cream, Apply 1 application topically daily as needed (foot fungus). , Disp: , Rfl:    TRUE METRIX BLOOD GLUCOSE TEST test strip, USE AS DIRECTED TO CHECK BLOOD SUGARS 2 TIMES PER DAY, Disp: 200 strip, Rfl: 10   TRUEplus Lancets 33G MISC, TEST BLOOD SUGAR TWICE DAILY AS DIRECTED, Disp: 200 each, Rfl: 3   apixaban (ELIQUIS) 5 MG TABS tablet, Take 1 tablet (5 mg total) by mouth 2 (two) times daily., Disp: 60 tablet, Rfl:    furosemide (LASIX) 20 MG tablet, TAKE 4 TABLETS (80 MG) BY MOUTH IN THE MORNING AND 3 TABLETS (60 MG) EVERY EVENING., Disp: 630 tablet, Rfl: 3   guaiFENesin-dextromethorphan (ROBITUSSIN DM) 100-10 MG/5ML syrup, Take 10 mLs by mouth every 6 (six) hours as needed for cough., Disp: 118 mL, Rfl: 0   Insulin Glargine (TOUJEO SOLOSTAR [place]), Inject 20 Units into the skin at bedtime., Disp: , Rfl:    tiZANidine (ZANAFLEX) 2 MG tablet, TAKE 1  TABLET AT BEDTIME AS NEEDED, Disp: 30 tablet, Rfl: 3   Allergies  Allergen Reactions   Other Anaphylaxis and Rash    Bleach   Penicillins Hives    Did it involve swelling of the face/tongue/throat, SOB, or low BP? No Did it involve sudden or severe rash/hives, skin peeling, or any reaction on the inside of your mouth or nose? Yes Did you need to seek medical attention at a hospital or doctor's office? Yes When did it last happen?      Childhood allergy  If all above answers are "NO", may proceed with cephalosporin use.     Sulfa Antibiotics Hives   Aspirin Other (See Comments)     On aspirin 81 mg - Rectal bleeding in dec 2018   Codeine     Headache and makes the patient feel "off"     Review of Systems  Constitutional: Negative.   Respiratory: Negative.    Cardiovascular: Negative.   Neurological: Negative.   Psychiatric/Behavioral: Negative.       Today's Vitals   07/12/22 1548  BP: 108/76  Pulse: 89  Temp: 98.1 F (36.7 C)  SpO2: 98%  Weight: 179 lb 12.8 oz (81.6 kg)  Height: 5' 1"$  (1.549 m)   Body mass index is 33.97 kg/m.  Wt Readings from Last 3 Encounters:  08/01/22 176 lb 9.6 oz (80.1 kg)  07/30/22 176 lb 12.8 oz (80.2 kg)  07/25/22 178 lb (80.7 kg)    Objective:  Physical Exam Vitals and nursing note reviewed.  Constitutional:      Appearance: Normal appearance.  HENT:     Head: Normocephalic and atraumatic.  Nose:     Comments: Masked     Mouth/Throat:     Comments: Masked  Eyes:     Extraocular Movements: Extraocular movements intact.  Cardiovascular:     Rate and Rhythm: Normal rate. Rhythm irregular.     Heart sounds: Normal heart sounds.  Pulmonary:     Effort: Pulmonary effort is normal.     Breath sounds: Normal breath sounds.  Musculoskeletal:     Cervical back: Normal range of motion.  Skin:    General: Skin is warm.  Neurological:     General: No focal deficit present.     Mental Status: She is alert.  Psychiatric:         Mood and Affect: Mood normal.        Behavior: Behavior normal.         Assessment And Plan:     1. Stroke-like symptoms Comments: ER records reviewed. CT/MRI brain w/o new findings TCM PERFORMED. A MEMBER OF THE CLINICAL TEAM SPOKE WITH THE PATIENT UPON DISCHARGE. DISCHARGE SUMMARY WAS REVIEWED IN FULL DETAIL DURING THE VISIT. MEDS RECONCILED AND COMPARED TO DISCHARGE MEDS. MEDICATION LIST WAS UPDATED AND REVIEWED WITH THE PATIENT. GREATER THAN 50% FACE TO FACE TIME WAS SPENT IN COUNSELING AND COORDINATION OF CARE. ALL QUESTIONS WERE ANSWERED TO THE SATISFACTION OF THE PATIENT.   2. Seizure ([place]) Comments: Chronic, encouraged to c/w Keppra and Lamictal. Encouraged to keep Neuro f/u.  3. Atrial flutter by electrocardiogram [facility]) Comments: New onset. She has Cardiology f/u tomorrow. Currently rate controlled and anticoagulated w/ Eliquis.  4. Type 2 diabetes mellitus with stage 2 chronic kidney disease, with long-term current use of insulin (HCC) Comments: Chronic, no need to check a1c today.  She will c/w weekly Ozempic 24m and Toujeo 20 units nightly.   5. Hypertensive heart and renal disease with renal failure, stage 1 through stage 4 or unspecified chronic kidney disease, with heart failure ([facility] Comments: Chronic, importance of salt restriction was d/w patient.  She will c/w furosemide 220m carvedilol 12.76m28mo BID, and hydralazine.   6. Acquired thrombophilia [place]: She is now on Eliquis due to new onset afib.  7. Class 1 obesity due to excess calories with serious comorbidity and body mass index (BMI) of 33.0 to 33.9 in adult Comments: She is encouraged to strive for BMI less than 30 to decrease cardiac risk. Advised to aim for at least 150 minutes of exercise per week.  8. Pharmacologic therapy - Levetiracetam level  Patient was given opportunity to ask questions. Patient verbalized understanding of the plan and was able to repeat key elements of the plan. All  questions were answered to their satisfaction.   I, RobMaximino GreenlandD, have reviewed all documentation for this visit. The documentation on 07/12/22 for the exam, diagnosis, procedures, and orders are all accurate and complete.   IF YOU HAVE BEEN REFERRED TO A SPECIALIST, IT MAY TAKE 1-2 WEEKS TO SCHEDULE/PROCESS THE REFERRAL. IF YOU HAVE NOT HEARD FROM US/SPECIALIST IN TWO WEEKS, PLEASE GIVE US [place]CALL AT [phone] X [phone].   THE PATIENT IS ENCOURAGED TO PRACTICE SOCIAL DISTANCING DUE TO THE COVID-19 PANDEMIC.

## 2022-07-12 NOTE — Patient Instructions (Signed)
Stroke Prevention Some medical conditions and behaviors can lead to a higher chance of having a stroke. You can help prevent a stroke by eating healthy, exercising, not smoking, and managing any medical conditions you have. Stroke is a leading cause of functional impairment. Primary prevention is particularly important because a majority of strokes are first-time events. Stroke changes the lives of not only those who experience a stroke but also their family and other caregivers. How can this condition affect me? A stroke is a medical emergency and should be treated right away. A stroke can lead to brain damage and can sometimes be life-threatening. If a person gets medical treatment right away, there is a better chance of surviving and recovering from a stroke. What can increase my risk? The following medical conditions may increase your risk of a stroke: Cardiovascular disease. High blood pressure (hypertension). Diabetes. High cholesterol. Sickle cell disease. Blood clotting disorders (hypercoagulable state). Obesity. Sleep disorders (obstructive sleep apnea). Other risk factors include: Being older than age 60. Having a history of blood clots, stroke, or mini-stroke (transient ischemic attack, TIA). Genetic factors, such as race, ethnicity, or a family history of stroke. Smoking cigarettes or using other tobacco products. Taking birth control pills, especially if you also use tobacco. Heavy use of alcohol or drugs, especially cocaine and methamphetamine. Physical inactivity. What actions can I take to prevent this? Manage your health conditions High cholesterol levels. Eating a healthy diet is important for preventing high cholesterol. If cholesterol cannot be managed through diet alone, you may need to take medicines. Take any prescribed medicines to control your cholesterol as told by your health care provider. Hypertension. To reduce your risk of stroke, try to keep your blood  pressure below 130/80. Eating a healthy diet and exercising regularly are important for controlling blood pressure. If these steps are not enough to manage your blood pressure, you may need to take medicines. Take any prescribed medicines to control hypertension as told by your health care provider. Ask your health care provider if you should monitor your blood pressure at home. Have your blood pressure checked every year, even if your blood pressure is normal. Blood pressure increases with age and some medical conditions. Diabetes. Eating a healthy diet and exercising regularly are important parts of managing your blood sugar (glucose). If your blood sugar cannot be managed through diet and exercise, you may need to take medicines. Take any prescribed medicines to control your diabetes as told by your health care provider. Get evaluated for obstructive sleep apnea. Talk to your health care provider about getting a sleep evaluation if you snore a lot or have excessive sleepiness. Make sure that any other medical conditions you have, such as atrial fibrillation or atherosclerosis, are managed. Nutrition Follow instructions from your health care provider about what to eat or drink to help manage your health condition. These instructions may include: Reducing your daily calorie intake. Limiting how much salt (sodium) you use to 1,500 milligrams (mg) each day. Using only healthy fats for cooking, such as olive oil, canola oil, or sunflower oil. Eating healthy foods. You can do this by: Choosing foods that are high in fiber, such as whole grains, and fresh fruits and vegetables. Eating at least 5 servings of fruits and vegetables a day. Try to fill one-half of your plate with fruits and vegetables at each meal. Choosing lean protein foods, such as lean cuts of meat, poultry without skin, fish, tofu, beans, and nuts. Eating low-fat dairy products. Avoiding   foods that are high in sodium. This can help  lower blood pressure. Avoiding foods that have saturated fat, trans fat, and cholesterol. This can help prevent high cholesterol. Avoiding processed and prepared foods. Counting your daily carbohydrate intake.  Lifestyle If you drink alcohol: Limit how much you have to: 0-1 drink a day for women who are not pregnant. 0-2 drinks a day for men. Know how much alcohol is in your drink. In the [place] one drink equals one 12 oz bottle of beer (312mL), one 5 oz glass of wine (180mL), or one 1 oz glass of hard liquor (30mL). Do not use any products that contain nicotine or tobacco. These products include cigarettes, chewing tobacco, and vaping devices, such as e-cigarettes. If you need help quitting, ask your health care provider. Avoid secondhand smoke. Do not use drugs. Activity  Try to stay at a healthy weight. Get at least 30 minutes of exercise on most days, such as: Fast walking. Biking. Swimming. Medicines Take over-the-counter and prescription medicines only as told by your health care provider. Aspirin or blood thinners (antiplatelets or anticoagulants) may be recommended to reduce your risk of forming blood clots that can lead to stroke. Avoid taking birth control pills. Talk to your health care provider about the risks of taking birth control pills if: You are over 34 years old. You smoke. You get very bad headaches. You have had a blood clot. Where to find more information American Stroke Association: www.strokeassociation.org Get help right away if: You or a loved one has any symptoms of a stroke. "BE FAST" is an easy way to remember the main warning signs of a stroke: B - Balance. Signs are dizziness, sudden trouble walking, or loss of balance. E - Eyes. Signs are trouble seeing or a sudden change in vision. F - Face. Signs are sudden weakness or numbness of the face, or the face or eyelid drooping on one side. A - Arms. Signs are weakness or numbness in an arm. This happens  suddenly and usually on one side of the body. S - Speech. Signs are sudden trouble speaking, slurred speech, or trouble understanding what people say. T - Time. Time to call emergency services. Write down what time symptoms started. You or a loved one has other signs of a stroke, such as: A sudden, severe headache with no known cause. Nausea or vomiting. Seizure. These symptoms may represent a serious problem that is an emergency. Do not wait to see if the symptoms will go away. Get medical help right away. Call your local emergency services (911 in the [place] Do not drive yourself to the hospital. Summary You can help to prevent a stroke by eating healthy, exercising, not smoking, limiting alcohol intake, and managing any medical conditions you may have. Do not use any products that contain nicotine or tobacco. These include cigarettes, chewing tobacco, and vaping devices, such as e-cigarettes. If you need help quitting, ask your health care provider. Remember "BE FAST" for warning signs of a stroke. Get help right away if you or a loved one has any of these signs. This information is not intended to replace advice given to you by your health care provider. Make sure you discuss any questions you have with your health care provider. Document Revised: 01/11/2020 Document Reviewed: 01/11/2020 Elsevier Patient Education  [place].

## 2022-07-13 ENCOUNTER — Ambulatory Visit (HOSPITAL_COMMUNITY)
Admission: RE | Admit: 2022-07-13 | Discharge: 2022-07-13 | Disposition: A | Discharge status: Home / Self Care | Payer: Medicare HMO | Source: Ambulatory Visit | Attending: Cardiology | Admitting: Cardiology

## 2022-07-13 ENCOUNTER — Encounter (HOSPITAL_COMMUNITY): Payer: Self-pay | Admitting: Cardiology

## 2022-07-13 ENCOUNTER — Other Ambulatory Visit (HOSPITAL_COMMUNITY): Payer: Self-pay

## 2022-07-13 VITALS — BP 110/60 | HR 72 | Wt 182.8 lb

## 2022-07-13 DIAGNOSIS — G4733 Obstructive sleep apnea (adult) (pediatric): Secondary | ICD-10-CM | POA: Insufficient documentation

## 2022-07-13 DIAGNOSIS — F17210 Nicotine dependence, cigarettes, uncomplicated: Secondary | ICD-10-CM | POA: Diagnosis not present

## 2022-07-13 DIAGNOSIS — Z79899 Other long term (current) drug therapy: Secondary | ICD-10-CM | POA: Diagnosis not present

## 2022-07-13 DIAGNOSIS — R0789 Other chest pain: Secondary | ICD-10-CM | POA: Insufficient documentation

## 2022-07-13 DIAGNOSIS — E669 Obesity, unspecified: Secondary | ICD-10-CM | POA: Diagnosis not present

## 2022-07-13 DIAGNOSIS — I5032 Chronic diastolic (congestive) heart failure: Secondary | ICD-10-CM

## 2022-07-13 DIAGNOSIS — R059 Cough, unspecified: Secondary | ICD-10-CM | POA: Diagnosis not present

## 2022-07-13 DIAGNOSIS — Z7901 Long term (current) use of anticoagulants: Secondary | ICD-10-CM | POA: Insufficient documentation

## 2022-07-13 DIAGNOSIS — I484 Atypical atrial flutter: Secondary | ICD-10-CM | POA: Insufficient documentation

## 2022-07-13 DIAGNOSIS — J8410 Pulmonary fibrosis, unspecified: Secondary | ICD-10-CM | POA: Insufficient documentation

## 2022-07-13 DIAGNOSIS — I272 Pulmonary hypertension, unspecified: Secondary | ICD-10-CM | POA: Insufficient documentation

## 2022-07-13 DIAGNOSIS — J439 Emphysema, unspecified: Secondary | ICD-10-CM | POA: Diagnosis not present

## 2022-07-13 DIAGNOSIS — I110 Hypertensive heart disease with heart failure: Secondary | ICD-10-CM | POA: Insufficient documentation

## 2022-07-13 DIAGNOSIS — Z8616 Personal history of COVID-19: Secondary | ICD-10-CM | POA: Insufficient documentation

## 2022-07-13 LAB — LEVETIRACETAM LEVEL: Levetiracetam Lvl: 18.1 ug/mL (ref 10.0–40.0)

## 2022-07-13 NOTE — Patient Instructions (Addendum)
There has been no changes to your medications.  Your physician has requested that you have an echocardiogram. Echocardiography is a painless test that uses sound waves to create images of your heart. It provides your doctor with information about the size and shape of your heart and how well your heart's chambers and valves are working. This procedure takes approximately one hour. There are no restrictions for this procedure. Please do NOT wear cologne, perfume, aftershave, or lotions (deodorant is allowed). Please arrive 15 minutes prior to your appointment time.   You are scheduled for a Cardioversion on Wednesday, February 7 with Dr. [name].  Please arrive at the [facility] (Main Entrance A) at [facility]: [address] [place], [place] [address] at 11:30 AM.    DIET:  Nothing to eat or drink after midnight except a sip of water with medications (see medication instructions below)  MEDICATION INSTRUCTIONS:  HOLD Lasix the morning of Procedure 08/01/22         HOLD: Semaglutide (Ozempic, Rybelsus, Emerado) for 7 days before your procedure        Continue taking your anticoagulant (blood thinner): Apixaban (Eliquis).  You will need to continue this after your procedure until you are told by your provider that it is safe to stop.     FYI:  For your safety, and to allow [place] to monitor your vital signs accurately during the surgery/procedure we request: If you have artificial nails, gel coating, SNS etc, please have those removed prior to your surgery/procedure. Not having the nail coverings /polish removed may result in cancellation or delay of your surgery/procedure.  You must have a responsible person to drive you home and stay in the waiting area during your procedure. Failure to do so could result in cancellation.  Bring your insurance cards.  *Special Note: Every effort is made to have your procedure done on time. Occasionally there are emergencies that occur at the  hospital that may cause delays. Please be patient if a delay does occur.   Your physician recommends that you schedule a follow-up appointment in: 6 weeks   If you have any questions or concerns before your next appointment please send [place] a message through [place] or call our office at [phone].    TO LEAVE A MESSAGE FOR THE NURSE SELECT OPTION 2, PLEASE LEAVE A MESSAGE INCLUDING: YOUR NAME DATE OF BIRTH CALL BACK NUMBER REASON FOR CALL**this is important as we prioritize the call backs  YOU WILL RECEIVE A CALL BACK THE SAME DAY AS LONG AS YOU CALL BEFORE 4:00 PM  At the [facility], you and your health needs are our priority. As part of our continuing mission to provide you with exceptional heart care, we have created designated Provider Care Teams. These Care Teams include your primary Cardiologist (physician) and Advanced Practice Providers (APPs- Physician Assistants and Nurse Practitioners) who all work together to provide you with the care you need, when you need it.   You may see any of the following providers on your designated Care Team at your next follow up: Dr Glori Bickers Dr Loralie Champagne Dr. Roxana Hires, NP Lyda Jester, [place] [facility] [place], [place] Forestine Na, NP Audry Riles, PharmD   Please be sure to bring in all your medications bottles to every appointment.

## 2022-07-13 NOTE — Progress Notes (Signed)
PCP: [name], MD Cardiology: Dr. Ellyn Hack HF Cardiology: Dr. [name]  77 y.o. with history of OHS/OSA and RV dysfunction was referred by Dr. Ellyn Hack for evaluation of CHF.  She has a history of OSA/OHS.  She uses CPAP at night but not currently using oxygen during the day.  Remote smoker.  She had an echo in 9/20 showing EF 50-55% but moderate-severe RV dysfunction with mild-moderate RV dilation.  RHC/LHC in 10/20 showed normal coronaries, pulmonary venous hypertension.  V/Q scan in 2/21 did not show evidence for acute on chronic PE.    Cardiomems was placed in 5/21.  RHC at that time showed normal filling pressures and moderate pulmonary hypertension but suspect due to high output.  No evidence for shunt lesion.  Echo showed EF 60-65%, enlarged RV with normal systolic function.   She was admitted in 5/21 with syncope, thought to be due to a seizure.    She had COVID-19 infection in 8/21. 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   Echo in 9/21 with EF 50-55%, severe RV enlargement and severely decreased RV systolic function, PASP 54 mmHg.    She was admitted in 5/22 with CHF, bronchitis.  She was diuresed and treated with antibiotics. Echo in 5/22 showed EF 60-65%, mild LVH, ?normal RV, severe LAE, ?normal PA pressure, IVC normal.  V/Q scan negative.   In 10/22, she was admitted with GI bleeding, thought to be diverticular.  BP meds were decreased and Lasix was cut back.  With increased weight, she increased her Lasix back to 80 mg daily.   She was seen in the ER in 1/24 with atypical atrial flutter, this was new.  She was started on apixaban and sent home from ER.   She returns for followup of CHF.  She is in atrial fibrillation today.  She is taking apixaban.  She is using CPAP nightly.  Weight down 5 lbs.  She has had a cough since late December.  She at times has dyspnea walking around  the house but not always.  She has chronic 4-pillow orthopnea.  Occasional atypical chest pain.  No lightheadedness.  She does not feel palpitations.   ECG (personally reviewed): Atrial fibrillation, iRBBB  Cardiomems PADP 20 mmHg (goal 20).   Labs (10/20): K 4.1, creatinine 1.15, hgb 11.4 Labs (6/21): K 3.5, creatinine 1.04, BNP 81 Labs (12/21): K 3.7, creatinine 0.79 Labs (11/22): LDL 63, BNP 33 Labs (2/23): K 3.8, creatinine 0.97 Labs (9/23): K 4.1, creatinine 0.97 Labs (1/24): K 3.5, creatinine 0.92, BNP 167  PMH: 1. OHS/OSA: She uses CPAP.  2. H/o CVA 3. Seizure disorder 4. Type 2 DM 5. Depression 6. HTN 7. Hyperlipidemia 8. Chronic diastolic CHF: Echo (0/86) with EF 50-55%, moderate to severe RV dilation with mild-moderately decreased RV systolic function.  - RHC/LHC (10/20): normal coronaries; mean RA 10, PA 56/14 mean 31, mean PCWP 13, LVEDP 18, CO/CI 7.35/3.73, PVR 2.44.  - RHC/Cardiomems placement (5/21): mean RA 7, PA 58/21 mean 34, PCWP mean 12, CI 8.3 F/3.95 T, PVR 2.86 WU. No evidence for shunt lesion.  - Echo (5/21): EF 60-65%, RV enlarged with normal systolic function.  - Echo (9/21): EF 50-55%, severe RV enlargement and severely decreased RV systolic function, PASP 54 mmHg.  - Echo (5/22): EF 60-65%, mild LVH, ?normal RV, severe LAE, ?normal PA pressure, IVC normal. 9. Primarily pulmonary venous hypertension.  - V/Q scan (2/21,  5/22): No evidence for acute or chronic PE.  - PFTs normal in 5/21.  10. COVID-19 PNA 8/21.  - CT chest in 10/21 with emphysema and mild fibrosis concerning for post-infectious inflammatory fibrosis from COVID.  11. GI bleeding: 10/22, diverticular bleed.  12. Atypical atrial flutter/atrial fibrillation: First noted 1/24.   Social History   Socioeconomic History   Marital status: Divorced    Spouse name: Not on file   Number of children: Not on file   Years of education: Not on file   Highest education level: Not on file   Occupational History   Occupation: Disabled    Comment: CNA  Tobacco Use   Smoking status: Former    Packs/day: 0.50    Years: 25.00    Total pack years: 12.50    Types: Cigarettes   Smokeless tobacco: Never   Tobacco comments:    quit smoking 20+ytrs ago  Vaping Use   Vaping Use: Never used  Substance and Sexual Activity   Alcohol use: No   Drug use: No   Sexual activity: Not Currently    Birth control/protection: Surgical  Other Topics Concern   Not on file  Social History Narrative   She lives in [place]. She has lots of family in the area and is accompanied by her sister.   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   She has a distant history of smoking, quit 20 years ago.   Right handed    Social Determinants of Health   Financial Resource Strain: Low Risk  (07/06/2021)   Overall Financial Resource Strain (CARDIA)    Difficulty of Paying Living Expenses: Not hard at all  Food Insecurity: No Food Insecurity (07/06/2021)   Hunger Vital Sign    Worried About Running Out of Food in the Last Year: Never true    Ran Out of Food in the Last Year: Never true  Transportation Needs: No Transportation Needs (07/06/2021)   PRAPARE - Hydrologist (Medical): No    Lack of Transportation (Non-Medical): No  Recent Concern: Transportation Needs - Unmet Transportation Needs (04/26/2021)   PRAPARE - Transportation    Lack of Transportation (Medical): Yes    Lack of Transportation (Non-Medical): Yes  Physical Activity: Inactive (07/06/2021)   Exercise Vital Sign    Days of Exercise per Week: 0 days    Minutes of Exercise per Session: 0 min  Stress: No Stress Concern Present (07/06/2021)   [place]    Feeling of Stress : Only a little  Social Connections: Not on file  Intimate Partner Violence: Not At Risk (10/22/2018)   Humiliation, Afraid, Rape, and Kick  questionnaire    Fear of Current or Ex-Partner: No    Emotionally Abused: No    Physically Abused: No    Sexually Abused: No   Family History  Problem Relation Age of Onset   Allergies Father    Heart disease Father        before 31   Heart failure Father    Hypertension Father    Hyperlipidemia Father    Heart disease Mother    Diabetes Mother    Hypertension Mother    Hyperlipidemia Mother    Hypertension Sister    Heart disease Sister        before 65   Hyperlipidemia Sister    Diabetes Brother    Hypertension Brother  Diabetes Sister    Hypertension Sister    Diabetes Son    Hypertension Son    Cancer Other    Current Outpatient Medications  Medication Sig Dispense Refill   acetaminophen (TYLENOL) 500 MG tablet Take 500-1,000 mg by mouth every 4 (four) hours as needed for moderate pain.      albuterol (VENTOLIN HFA) 108 (90 Base) MCG/ACT inhaler Inhale 1-2 puffs into the lungs every 6 (six) hours as needed for wheezing or shortness of breath. 18 g 5   Alcohol Swabs (DROPSAFE ALCOHOL PREP) 70 % PADS USE TWO TIMES DAILY AS DIRECTED 200 each 6   apixaban (ELIQUIS) 5 MG TABS tablet Take 1 tablet (5 mg total) by mouth 2 (two) times daily. 60 tablet 0   Ascorbic Acid (VITAMIN C) 1000 MG tablet Take 1,000 mg by mouth daily.     atorvastatin (LIPITOR) 10 MG tablet TAKE 1 TABLET EVERY EVENING 90 tablet 2   B-D ULTRAFINE III SHORT PEN 31G X 8 MM MISC USE DAILY WITH VICTOZA AND/OR NOVOLOG. 400 each 2   benzonatate (TESSALON) 100 MG capsule Take 1 capsule (100 mg total) by mouth 3 (three) times daily as needed for cough. 30 capsule 0   Blood Glucose Monitoring Suppl (TRUE METRIX METER) w/Device KIT USE AS DIRECTED 1 kit 1   carvedilol (COREG) 12.5 MG tablet Take 1 tablet (12.5 mg total) by mouth 2 (two) times daily. 180 tablet 3   dexlansoprazole (DEXILANT) 60 MG capsule Take 1 capsule (60 mg total) by mouth daily. 90 capsule 2   diclofenac sodium (VOLTAREN) 1 % GEL Apply 2 g  topically 4 (four) times daily as needed (pain).     ferrous sulfate 324 MG TBEC Patient takes 1 tablet by mouth every other day.     furosemide (LASIX) 20 MG tablet TAKE 4 TABLETS (80 MG) BY MOUTH IN THE MORNING AND 3 TABLETS (60 MG) EVERY EVENING. 210 tablet 6   Glucagon (GVOKE HYPOPEN 2-PACK) 0.5 MG/0.1ML SOAJ INJECT 0.5 MG INTO THE SKIN DAILY AS NEEDED. 2 mL 3   guaiFENesin-dextromethorphan (ROBITUSSIN DM) 100-10 MG/5ML syrup Take 10 mLs by mouth every 6 (six) hours as needed for cough. 118 mL 0   hydrALAZINE (APRESOLINE) 100 MG tablet Take 1 tablet (100 mg total) by mouth 3 (three) times daily. 270 tablet 3   hydrocortisone cream 1 % Apply 1 application topically daily as needed for itching.      insulin glargine, 1 Unit Dial, (TOUJEO SOLOSTAR) 300 UNIT/ML Solostar Pen INJECT 36 UNITS INTO THE SKIN AT BEDTIME. 9 mL 10   insulin lispro (HUMALOG KWIKPEN) 100 UNIT/ML KwikPen Inject 0.02-0.04 mLs (2-4 Units total) into the skin 3 (three) times daily as needed (high blood sugar over 150). 15 mL 2   Isopropyl Alcohol (ALCOHOL WIPES) 70 % MISC Apply 1 each topically 2 (two) times daily. 300 each 3   isosorbide mononitrate (IMDUR) 30 MG 24 hr tablet TAKE 1 TABLET EVERY DAY 90 tablet 2   lamoTRIgine (LAMICTAL) 150 MG tablet Take 1 tablet (150 mg total) by mouth 2 (two) times daily. 180 tablet 3   levETIRAcetam (KEPPRA) 500 MG tablet Take 2 tablets every night 180 tablet 3   ondansetron (ZOFRAN) 4 MG tablet Take 1 tablet (4 mg total) by mouth every 8 (eight) hours as needed for nausea or vomiting. 20 tablet 1   OZEMPIC, 1 MG/DOSE, 4 MG/3ML SOPN INJECT 1 MG INTO THE SKIN ONCE A WEEK. 9 mL 3  Pancrelipase, Lip-Prot-Amyl, 25000-79000 units CPEP Take 1-2 capsules by mouth See admin instructions. Take 1 capsules in the morning with breakfast, 1 capsule with lunch, and 2 capsules with supper     potassium chloride SA (KLOR-CON M) 20 MEQ tablet Take 2 tablets (40 mEq total) by mouth 2 (two) times daily. With  additional 60 on Metolazone days 120 tablet 0   sennosides-docusate sodium (SENOKOT-S) 8.6-50 MG tablet Take 1-2 tablets by mouth at bedtime as needed for constipation.     sertraline (ZOLOFT) 50 MG tablet TAKE 1 TABLET EVERY DAY 90 tablet 2   tiZANidine (ZANAFLEX) 2 MG tablet Take one tablet by mouth at bedtime as needed 30 tablet 2   tolnaftate (TINACTIN) 1 % cream Apply 1 application topically daily as needed (foot fungus).      TRUE METRIX BLOOD GLUCOSE TEST test strip USE AS DIRECTED TO CHECK BLOOD SUGARS 2 TIMES PER DAY 200 strip 10   TRUEplus Lancets 33G MISC TEST BLOOD SUGAR TWICE DAILY AS DIRECTED 200 each 3   No current facility-administered medications for this encounter.   BP 110/60   Pulse 72   Wt 82.9 kg (182 lb 12.8 oz)   SpO2 96%   BMI 34.54 kg/m  General: NAD Neck: No JVD, no thyromegaly or thyroid nodule.  Lungs: mild crackles at bases CV: Nondisplaced PMI.  Heart irregular S1/S2, no S3/S4, 2/6 early SEM RUSB.  No peripheral edema.  No carotid bruit.  Normal pedal pulses.  Abdomen: Soft, nontender, no hepatosplenomegaly, no distention.  Skin: Intact without lesions or rashes.  Neurologic: Alert and oriented x 3.  Psych: Normal affect. Extremities: No clubbing or cyanosis.  HEENT: Normal.   Assessment/Plan: 1. Chronic diastolic CHF/RV failure: Echo in 9/20 showed EF 50-55% with moderate-severe RV dilation and mild-moderately decreased systolic function. RHC/LHC showed mild-moderate pulmonary venous hypertension and no significant coronary disease.  Cause of RV dysfunction is not clear, possibly due to diastolic CHF + OHS/OSA.  V/Q scan in 2/21 did not show acute or chronic PE.  Cardiomems was placed in 5/21, normal filling pressures with moderate pulmonary hypertension likely due to high output (no evidence for shunt lesion on RHC).  Echo in 9/21 with EF 50-55%, severe RVE with severe RV dysfunction.  Echo in 5/22 with EF 60-65%, RV function reportedly normal with normal  PA pressure.  Her dyspnea is partially related to lung disease; based on last CT, she has developed mild pulmonary fibrosis since COVID, possibly post-inflammatory.  Cardiomems 20 mmHg today which is at goal, she is not volume overloaded on exam today.  NYHA class II-III.  - Continue Lasix 80 qam/60 qpm.  Recent BMET was stable.  - She did not tolerate SGLT2-inhibitor due to recurrent yeast infections.    - I will arrange for repeat echo.  2. OHS/OSA: She is using CPAP but not oxygen during the day (no longer appears to need). CT chest in 10/21 with emphysema and mild fibrosis, possibly post-inflammatory after COVID-19 infection.   - Followup with pulmonary (Dr. Vaughan Browner).  3. Pulmonary hypertension: RHC in 10/20 showed mild-moderate pulmonary venous hypertension.  RHC in 5/21 showed moderate pulmonary hypertension with low PVR and high CO, likely PH due to high output, no evidence for shunt lesion. No role for selective pulmonary vasodilators.  4. HTN: BP controlled on hydralazine and Coreg.  5. Obesity: She is on semaglutide, weight trending down.  6. GI bleeding: No overt bleeding now.   7. Atypical atrial flutter/atrial fibrillation: Atypical flutter  noted in ER earlier this month, now in AF.  HR controlled on Coreg.  - Continue Coreg.  - Continue apixaban, he has not missed doses since starting.  - I will arrange for DCCV in about 3 wks.   Followup in 6 wks with APP.   Loralie Champagne 07/13/2022

## 2022-07-14 ENCOUNTER — Other Ambulatory Visit: Payer: Self-pay | Admitting: Internal Medicine

## 2022-07-14 DIAGNOSIS — E1122 Type 2 diabetes mellitus with diabetic chronic kidney disease: Secondary | ICD-10-CM

## 2022-07-14 DIAGNOSIS — I4892 Unspecified atrial flutter: Secondary | ICD-10-CM

## 2022-07-14 DIAGNOSIS — I272 Pulmonary hypertension, unspecified: Secondary | ICD-10-CM

## 2022-07-14 DIAGNOSIS — I110 Hypertensive heart disease with heart failure: Secondary | ICD-10-CM

## 2022-07-16 ENCOUNTER — Other Ambulatory Visit: Payer: Self-pay | Admitting: Internal Medicine

## 2022-07-18 ENCOUNTER — Ambulatory Visit: Payer: Medicare HMO | Admitting: Internal Medicine

## 2022-07-18 ENCOUNTER — Ambulatory Visit (INDEPENDENT_AMBULATORY_CARE_PROVIDER_SITE_OTHER): Payer: Medicare HMO

## 2022-07-18 ENCOUNTER — Ambulatory Visit: Payer: Medicare HMO

## 2022-07-18 VITALS — Ht 61.0 in | Wt 178.0 lb

## 2022-07-18 DIAGNOSIS — Z0000 Encounter for general adult medical examination without abnormal findings: Secondary | ICD-10-CM

## 2022-07-18 NOTE — Patient Instructions (Signed)
[name] , Thank you for taking time to come for your Medicare Wellness Visit. I appreciate your ongoing commitment to your health goals. Please review the following plan we discussed and let me know if I can assist you in the future.   These are the goals we discussed:  Goals       Manage My Medicine      Timeframe:  Long-Range Goal Priority:  High Start Date:                             Expected End Date:                       Follow Up Date 06/26/2022   In Progress:  - call for medicine refill 2 or 3 days before it runs out - call if I am sick and can't take my medicine - keep a list of all the medicines I take; vitamins and herbals too - learn to read medicine labels - use an alarm clock or phone to remind me to take my medicine    Why is this important?   These steps will help you keep on track with your medicines.   Notes:  Please call if you have any questions about your medications.      Patient Stated      01/06/2020, stay healthy      Patient Stated      08/11/2020, want to weigh 160-165 pounds      Patient Stated      07/06/2021, wants to lose weight      Patient Stated      07/18/2022, working on heart      Weight (lb) < 200 lb (90.7 kg) (pt-stated)      Wants to be 165 pounds        This is a list of the screening recommended for you and due dates:  Health Maintenance  Topic Date Due   Eye exam for diabetics  02/13/2022   COVID-19 Vaccine (6 - 2023-24 season) 02/23/2022   Yearly kidney health urinalysis for diabetes  08/17/2022   Mammogram  07/27/2022   Complete foot exam   09/07/2022   Hemoglobin A1C  12/14/2022   Yearly kidney function blood test for diabetes  07/06/2023   Medicare Annual Wellness Visit  07/19/2023   Colon Cancer Screening  04/04/2031   Pneumonia Vaccine  Completed   Flu Shot  Completed   DEXA scan (bone density measurement)  Completed   Hepatitis C Screening: USPSTF Recommendation to screen - Ages 63-79 yo.  Completed    Zoster (Shingles) Vaccine  Completed   HPV Vaccine  Aged Out   DTaP/Tdap/Td vaccine  Discontinued    Advanced directives: copy in chart  Conditions/risks identified: none  Next appointment: Follow up in one year for your annual wellness visit    Preventive Care 65 Years and Older, Female Preventive care refers to lifestyle choices and visits with your health care provider that can promote health and wellness. What does preventive care include? A yearly physical exam. This is also called an annual well check. Dental exams once or twice a year. Routine eye exams. Ask your health care provider how often you should have your eyes checked. Personal lifestyle choices, including: Daily care of your teeth and gums. Regular physical activity. Eating a healthy diet. Avoiding tobacco and drug use. Limiting alcohol use. Practicing safe sex. Taking low-dose  aspirin every day. Taking vitamin and mineral supplements as recommended by your health care provider. What happens during an annual well check? The services and screenings done by your health care provider during your annual well check will depend on your age, overall health, lifestyle risk factors, and family history of disease. Counseling  Your health care provider may ask you questions about your: Alcohol use. Tobacco use. Drug use. Emotional well-being. Home and relationship well-being. Sexual activity. Eating habits. History of falls. Memory and ability to understand (cognition). Work and work Statistician. Reproductive health. Screening  You may have the following tests or measurements: Height, weight, and BMI. Blood pressure. Lipid and cholesterol levels. These may be checked every 5 years, or more frequently if you are over 21 years old. Skin check. Lung cancer screening. You may have this screening every year starting at age 63 if you have a 30-pack-year history of smoking and currently smoke or have quit within the  past 15 years. Fecal occult blood test (FOBT) of the stool. You may have this test every year starting at age 82. Flexible sigmoidoscopy or colonoscopy. You may have a sigmoidoscopy every 5 years or a colonoscopy every 10 years starting at age 13. Hepatitis C blood test. Hepatitis B blood test. Sexually transmitted disease (STD) testing. Diabetes screening. This is done by checking your blood sugar (glucose) after you have not eaten for a while (fasting). You may have this done every 1-3 years. Bone density scan. This is done to screen for osteoporosis. You may have this done starting at age 26. Mammogram. This may be done every 1-2 years. Talk to your health care provider about how often you should have regular mammograms. Talk with your health care provider about your test results, treatment options, and if necessary, the need for more tests. Vaccines  Your health care provider may recommend certain vaccines, such as: Influenza vaccine. This is recommended every year. Tetanus, diphtheria, and acellular pertussis (Tdap, Td) vaccine. You may need a Td booster every 10 years. Zoster vaccine. You may need this after age 24. Pneumococcal 13-valent conjugate (PCV13) vaccine. One dose is recommended after age 49. Pneumococcal polysaccharide (PPSV23) vaccine. One dose is recommended after age 52. Talk to your health care provider about which screenings and vaccines you need and how often you need them. This information is not intended to replace advice given to you by your health care provider. Make sure you discuss any questions you have with your health care provider. Document Released: 07/08/2015 Document Revised: 02/29/2016 Document Reviewed: 04/12/2015 Elsevier Interactive Patient Education  2017 [place] Prevention in the Home Falls can cause injuries. They can happen to people of all ages. There are many things you can do to make your home safe and to help prevent falls. What can  I do on the outside of my home? Regularly fix the edges of walkways and driveways and fix any cracks. Remove anything that might make you trip as you walk through a door, such as a raised step or threshold. Trim any bushes or trees on the path to your home. Use bright outdoor lighting. Clear any walking paths of anything that might make someone trip, such as rocks or tools. Regularly check to see if handrails are loose or broken. Make sure that both sides of any steps have handrails. Any raised decks and porches should have guardrails on the edges. Have any leaves, snow, or ice cleared regularly. Use sand or salt on walking paths during  winter. Clean up any spills in your garage right away. This includes oil or grease spills. What can I do in the bathroom? Use night lights. Install grab bars by the toilet and in the tub and shower. Do not use towel bars as grab bars. Use non-skid mats or decals in the tub or shower. If you need to sit down in the shower, use a plastic, non-slip stool. Keep the floor dry. Clean up any water that spills on the floor as soon as it happens. Remove soap buildup in the tub or shower regularly. Attach bath mats securely with double-sided non-slip rug tape. Do not have throw rugs and other things on the floor that can make you trip. What can I do in the bedroom? Use night lights. Make sure that you have a light by your bed that is easy to reach. Do not use any sheets or blankets that are too big for your bed. They should not hang down onto the floor. Have a firm chair that has side arms. You can use this for support while you get dressed. Do not have throw rugs and other things on the floor that can make you trip. What can I do in the kitchen? Clean up any spills right away. Avoid walking on wet floors. Keep items that you use a lot in easy-to-reach places. If you need to reach something above you, use a strong step stool that has a grab bar. Keep electrical  cords out of the way. Do not use floor polish or wax that makes floors slippery. If you must use wax, use non-skid floor wax. Do not have throw rugs and other things on the floor that can make you trip. What can I do with my stairs? Do not leave any items on the stairs. Make sure that there are handrails on both sides of the stairs and use them. Fix handrails that are broken or loose. Make sure that handrails are as long as the stairways. Check any carpeting to make sure that it is firmly attached to the stairs. Fix any carpet that is loose or worn. Avoid having throw rugs at the top or bottom of the stairs. If you do have throw rugs, attach them to the floor with carpet tape. Make sure that you have a light switch at the top of the stairs and the bottom of the stairs. If you do not have them, ask someone to add them for you. What else can I do to help prevent falls? Wear shoes that: Do not have high heels. Have rubber bottoms. Are comfortable and fit you well. Are closed at the toe. Do not wear sandals. If you use a stepladder: Make sure that it is fully opened. Do not climb a closed stepladder. Make sure that both sides of the stepladder are locked into place. Ask someone to hold it for you, if possible. Clearly mark and make sure that you can see: Any grab bars or handrails. First and last steps. Where the edge of each step is. Use tools that help you move around (mobility aids) if they are needed. These include: Canes. Walkers. Scooters. Crutches. Turn on the lights when you go into a dark area. Replace any light bulbs as soon as they burn out. Set up your furniture so you have a clear path. Avoid moving your furniture around. If any of your floors are uneven, fix them. If there are any pets around you, be aware of where they are. Review your medicines  with your doctor. Some medicines can make you feel dizzy. This can increase your chance of falling. Ask your doctor what other  things that you can do to help prevent falls. This information is not intended to replace advice given to you by your health care provider. Make sure you discuss any questions you have with your health care provider. Document Released: 04/07/2009 Document Revised: 11/17/2015 Document Reviewed: 07/16/2014 Elsevier Interactive Patient Education  2017 [employer].

## 2022-07-18 NOTE — Progress Notes (Signed)
I connected with [name] today by telephone and verified that I am speaking with the correct person using two identifiers. Location patient: home Location provider: work Persons participating in the virtual visit: Karynn Deblasi, Glenna Durand LPN.   I discussed the limitations, risks, security and privacy concerns of performing an evaluation and management service by telephone and the availability of in person appointments. I also discussed with the patient that there may be a patient responsible charge related to this service. The patient expressed understanding and verbally consented to this telephonic visit.    Interactive audio and video telecommunications were attempted between this provider and patient, however failed, due to patient having technical difficulties OR patient did not have access to video capability.  We continued and completed visit with audio only.     Vital signs may be patient reported or missing.  Subjective:   [name] is a 77 y.o. female who presents for Medicare Annual (Subsequent) preventive examination.  Review of Systems     Cardiac Risk Factors include: advanced age (>32mn, >>18women);diabetes mellitus;dyslipidemia;hypertension;obesity (BMI >30kg/m2)     Objective:    Today's Vitals   07/18/22 1056 07/18/22 1057  Weight: 178 lb (80.7 kg)   Height: '5\' 1"'$  (1.549 m)   PainSc:  7    Body mass index is 33.63 kg/m.     07/18/2022   11:08 AM 07/06/2022    4:58 AM 05/16/2022   10:41 PM 04/20/2022    4:04 PM 09/12/2021   10:54 AM 07/06/2021    3:16 PM 03/31/2021    8:47 PM  Advanced Directives  Does Patient Have a Medical Advance Directive? Yes No Yes Yes Yes Yes Yes  Type of AParamedicof [place]onroe[place] will  Healthcare Power of [place]of [place] will Living will;Healthcare Power of [place] of facility DNR (pink MOST or yellow form)  Copy of [place]own[place] Chart? Yes - validated most recent copy scanned in chart (See row information)     Yes - validated most recent copy scanned in chart (See row information)     Current Medications (verified) Outpatient Encounter Medications as of 07/18/2022  Medication Sig   albuterol (VENTOLIN HFA) 108 (90 Base) MCG/ACT inhaler Inhale 1-2 puffs into the lungs every 6 (six) hours as needed for wheezing or shortness of breath.   Alcohol Swabs (DROPSAFE ALCOHOL PREP) 70 % PADS USE TWO TIMES DAILY AS DIRECTED   apixaban (ELIQUIS) 5 MG TABS tablet Take 1 tablet (5 mg total) by mouth 2 (two) times daily.   Ascorbic Acid (VITAMIN C) 1000 MG tablet Take 1,000 mg by mouth daily.   atorvastatin (LIPITOR) 10 MG tablet TAKE 1 TABLET EVERY EVENING   B-D ULTRAFINE III SHORT PEN 31G X 8 MM MISC USE DAILY WITH VICTOZA AND/OR NOVOLOG.   benzonatate (TESSALON) 100 MG capsule Take 1 capsule (100 mg total) by mouth 3 (three) times daily as needed for cough.   Blood Glucose Monitoring Suppl (TRUE METRIX METER) w/Device KIT USE AS DIRECTED   carvedilol (COREG) 12.5 MG tablet Take 1 tablet (12.5 mg total) by mouth 2 (two) times daily.   diclofenac sodium (VOLTAREN) 1 % GEL Apply 2 g topically 4 (four) times daily as needed (pain).   ferrous sulfate 324 MG TBEC Patient takes 1 tablet by mouth every other day.   furosemide (LASIX) 20 MG tablet TAKE 4 TABLETS (80 MG) BY MOUTH IN THE MORNING  AND 3 TABLETS (60 MG) EVERY EVENING.   Glucagon (GVOKE HYPOPEN 2-PACK) 0.5 MG/0.1ML SOAJ INJECT 0.5 MG INTO THE SKIN DAILY AS NEEDED.   guaiFENesin-dextromethorphan (ROBITUSSIN DM) 100-10 MG/5ML syrup Take 10 mLs by mouth every 6 (six) hours as needed for cough.   hydrALAZINE (APRESOLINE) 100 MG tablet Take 1 tablet (100 mg total) by mouth 3 (three) times daily.   hydrocortisone cream 1 % Apply 1 application topically daily as needed for itching.    insulin glargine, 1 Unit Dial, (TOUJEO SOLOSTAR) 300 UNIT/ML Solostar Pen INJECT  36 UNITS INTO THE SKIN AT BEDTIME.   insulin lispro (HUMALOG KWIKPEN) 100 UNIT/ML KwikPen Inject 0.02-0.04 mLs (2-4 Units total) into the skin 3 (three) times daily as needed (high blood sugar over 150).   Isopropyl Alcohol (ALCOHOL WIPES) 70 % MISC Apply 1 each topically 2 (two) times daily.   isosorbide mononitrate (IMDUR) 30 MG 24 hr tablet TAKE 1 TABLET EVERY DAY   lamoTRIgine (LAMICTAL) 150 MG tablet Take 1 tablet (150 mg total) by mouth 2 (two) times daily.   levETIRAcetam (KEPPRA) 500 MG tablet Take 2 tablets every night   ondansetron (ZOFRAN) 4 MG tablet Take 1 tablet (4 mg total) by mouth every 8 (eight) hours as needed for nausea or vomiting.   OZEMPIC, 1 MG/DOSE, 4 MG/3ML SOPN INJECT 1 MG INTO THE SKIN ONCE A WEEK.   Pancrelipase, Lip-Prot-Amyl, 25000-79000 units CPEP Take 1-2 capsules by mouth See admin instructions. Take 1 capsules in the morning with breakfast, 1 capsule with lunch, and 2 capsules with supper   potassium chloride SA (KLOR-CON M) 20 MEQ tablet Take 2 tablets (40 mEq total) by mouth 2 (two) times daily. With additional 60 on Metolazone days   sennosides-docusate sodium (SENOKOT-S) 8.6-50 MG tablet Take 1-2 tablets by mouth at bedtime as needed for constipation.   sertraline (ZOLOFT) 50 MG tablet TAKE 1 TABLET EVERY DAY   tiZANidine (ZANAFLEX) 2 MG tablet TAKE 1 TABLET AT BEDTIME AS NEEDED   tolnaftate (TINACTIN) 1 % cream Apply 1 application topically daily as needed (foot fungus).    TRUE METRIX BLOOD GLUCOSE TEST test strip USE AS DIRECTED TO CHECK BLOOD SUGARS 2 TIMES PER DAY   TRUEplus Lancets 33G MISC TEST BLOOD SUGAR TWICE DAILY AS DIRECTED   acetaminophen (TYLENOL) 500 MG tablet Take 500-1,000 mg by mouth every 4 (four) hours as needed for moderate pain.  (Patient not taking: Reported on 07/18/2022)   dexlansoprazole (DEXILANT) 60 MG capsule Take 1 capsule (60 mg total) by mouth daily. (Patient not taking: Reported on 07/18/2022)   No facility-administered  encounter medications on file as of 07/18/2022.    Allergies (verified) Other, Penicillins, Sulfa antibiotics, Aspirin, and Codeine   History: Past Medical History:  Diagnosis Date   Abnormal liver function     in the past.   Anemia    Arthritis    all over   Bruises easily    Cataract    right eye;immature   CHF (congestive heart failure) (HCC)    Chronic back pain    stenosis   Chronic cough    Chronic kidney disease    COPD (chronic obstructive pulmonary disease) ([place])    COVID    aug/sept 2021   [facility] myocardial infarction [facility]) 2012   [place] Infarction in setting of Pancreatitis --> mild Troponin elevation, NON-OBSTRUCTIVE CAD   Depression    takes Abilify daily as well as Zoloft   Diastolic heart failure 2683   Grade  1 diastolic Dysfunction by Echo    Diverticulosis    DM (diabetes mellitus) ([place])    takes Victoza daily as well as Lantus and Humalog   Empty sella ([place])    on MRI in 2009.   Glaucoma    Headache(784.0)    last migraine-4-29yr ago   History of blood transfusion    no abnormal reaction noted   History of colon polyps    benign   HTN (hypertension)    takes Benicar,Imdur,and Bystolic daily   Hyperlipidemia    takes Lipitor daily   Joint swelling    Nocturia    Obesity hypoventilation syndrome ([place]    Obstructive sleep apnea 02/2018   Notably improved split-night study with weight loss from 270 (2017) down to 250 pounds (2019).   Pancreatitis    takes Pancrelipase daily   Parkinson's disease    takes Sinemet daily   Peripheral neuropathy    Pneumonia 2012   Seizures ([place]    takes Lamictal daily and Primidone nightly;last seizure 2wks ago   Urinary urgency    With increased frequency   Varicose veins of both lower extremities with pain    With edema.  Takes daily Lasix   Past Surgical History:  Procedure Laterality Date   ABDOMINAL HYSTERECTOMY     APPENDECTOMY     BACK SURGERY     Cardiac Event Monitor  September-October 2017    Sinus rhythm with occasional PACs and artifact. No arrhythmias besides one short run of tachycardia.   CHOLECYSTECTOMY     COLONOSCOPY N/A 08/15/2012   Procedure: COLONOSCOPY;  Surgeon: WArta Silence MD;  [facility]: [facility];  Service: Endoscopy;  Laterality: N/A;   COLONOSCOPY WITH PROPOFOL Left 06/17/2017   Procedure: COLONOSCOPY WITH PROPOFOL;  Surgeon: KRonnette Juniper MD;  [place] [facility]  Service: Gastroenterology;  Laterality: Left;   COLONOSCOPY WITH PROPOFOL N/A 04/03/2021   Procedure: COLONOSCOPY WITH PROPOFOL;  Surgeon: MClarene Essex MD;  [place] [facility]  Service: Endoscopy;  Laterality: N/A;   CORONARY CALCIUM SCORE AND CTA  04/2018   Coronary calcium score 71.9.  Very large, hyperdynamic LAD wrapping apex giving rise to PDA.  Moderate LAD-diagonal and circumflex plaque -> CT FFR suggested positive findings in distal D1, D2 and distal circumflex.  Referred for cath. ==> FALSE POSITIVE   ESOPHAGOGASTRODUODENOSCOPY     eye cysts Bilateral    LASIK     LEFT HEART CATH AND CORONARY ANGIOGRAPHY N/A 05/16/2018   Procedure: LEFT HEART CATH AND CORONARY ANGIOGRAPHY;  Surgeon: SBelva Crome MD;  [facility]:  Angiographically normal coronary arteries with LVEDP of 21 mmHg.  Large draping hyperdominant LAD that wraps the apex and provides distal half of the PDA.  Relatively small caliber distal Cx -->proxLPDA, non-dom RCA. No Cx or Diag lesions -- FALSE + CT FFR   LEFT HEART CATH AND CORONARY ANGIOGRAPHY  2009/March 2012   2009: (Dr. KAlvie Heidelberg Nonobstructive CAD; 2012: Minimal CAD --> false positive stress test   [facility]  01/2016; 01/2018   a)LOW RISK. No ischemia or infarction;; b) EF 55-60%. NO ST changes.  No ischemia or Infarction. NO significant RV enlargement.  LOW RISK.   POLYPECTOMY  04/03/2021   Procedure: POLYPECTOMY;  Surgeon: MClarene Essex MD;  [place] [facility]  Service: Endoscopy;;   Polysomnogram  02/2018   (Dr. DBrett Fairyfrom  neurology): Split study was not adequately done due to low AHI.  She slept reclined and had an AHI  less than 10, prolonged hypoxemia and no hypercapnia. -->  She has home oxygen already prescribed, but did not meet criteria for nightly oxygen.  (Was noted that the patient did not cooperate well with study.  Titration was discussed.   PRESSURE SENSOR/CARDIOMEMS N/A 11/09/2019   Procedure: PRESSURE SENSOR/CARDIOMEMS;  Surgeon: Larey Dresser, MD;  Location: [facility];  Service: Cardiovascular;  Laterality: N/A;   RECTAL POLYPECTOMY     RIGHT/LEFT HEART CATH AND CORONARY ANGIOGRAPHY N/A 04/06/2019   Procedure: RIGHT/LEFT HEART CATH AND CORONARY ANGIOGRAPHY;  Surgeon: Leonie Man, MD;  Location: [facility]VASIVE CV LAB; angiographically normal coronary arteries.  Moderately elevated AD LVEDP..  Mild pulmonary hypertension: PA P 56/14 mmHg-mean 31 mmHg.  RAP 10 mmHg.  RV P-EDP 54/4 mmHg - 12 mmHg.  PCWP 11-15 mmHg.  CO-CI: 7.35-3.73.   TONSILLECTOMY     TRANSTHORACIC ECHOCARDIOGRAM  01/2016; 05/2017   A) Normal LV chamber size with mod LVH pattern. EF 50-55%. Severe LA dilation. Mod RA dilation. PAP elevated at 38 mmHg;; B) Mild concentric LVH.  EF 55-60% & no RWMA.  Grade 2 DD.  Diastolic flattening of the ventricular septum == ? elevated PAP.  Mild aortic valve calcification/sclerosis.  Mod LA dilation.  Mild RV dilation & Mod RA dilation   TRANSTHORACIC ECHOCARDIOGRAM  10/2017; 01/2018   a) EF 55-60%.  No RWMA,  Moderate LA dilation.  Mild LA dilation.  Peak PA pressures ~57 mmHg (moderate);;; b) EF 55-60%.  GRII DD.  Suggestion of RV volume overload with moderate RV dilation.  Severe LA and RA dilation.Marland Kitchen  PA pressure ~67%.   TRANSTHORACIC ECHOCARDIOGRAM  01/2019   Normal LV size and function.  EF 50 to 55%.  Impaired relaxation.  RV appears to have moderately reduced function.  Severely enlarged.  Cannot fully assess RV pressures.  Hypermobile interatrial septum.  Right atrium severely dilated.    TUBAL LIGATION     vaginal cyst removed     several times   Family History  Problem Relation Age of Onset   Allergies Father    Heart disease Father        before 51   Heart failure Father    Hypertension Father    Hyperlipidemia Father    Heart disease Mother    Diabetes Mother    Hypertension Mother    Hyperlipidemia Mother    Hypertension Sister    Heart disease Sister        before 56   Hyperlipidemia Sister    Diabetes Brother    Hypertension Brother    Diabetes Sister    Hypertension Sister    Diabetes Son    Hypertension Son    Cancer Other    Social History   Socioeconomic History   Marital status: Divorced    Spouse name: Not on file   Number of children: Not on file   Years of education: Not on file   Highest education level: Not on file  Occupational History   Occupation: Disabled    Comment: CNA  Tobacco Use   Smoking status: Former    Packs/day: 0.50    Years: 25.00    Total pack years: 12.50    Types: Cigarettes   Smokeless tobacco: Never   Tobacco comments:    quit smoking 20+ytrs ago  Vaping Use   Vaping Use: Never used  Substance and Sexual Activity   Alcohol use: No   Drug use: No   Sexual activity: Not  Currently    Birth control/protection: Surgical  Other Topics Concern   Not on file  Social History Narrative   She lives in [place]. She has lots of family in the area and is accompanied by her sister.   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   She has a distant history of smoking, quit 20 years ago.   Right handed    Social Determinants of Health   Financial Resource Strain: Low Risk  (07/18/2022)   Overall Financial Resource Strain (CARDIA)    Difficulty of Paying Living Expenses: Not hard at all  Food Insecurity: No Food Insecurity (07/18/2022)   Hunger Vital Sign    Worried About Running Out of Food in the Last Year: Never true    Ran Out of Food in the Last Year: Never true   Transportation Needs: No Transportation Needs (07/18/2022)   PRAPARE - Hydrologist (Medical): No    Lack of Transportation (Non-Medical): No  Physical Activity: Inactive (07/18/2022)   Exercise Vital Sign    Days of Exercise per Week: 0 days    Minutes of Exercise per Session: 0 min  Stress: No Stress Concern Present (07/18/2022)   [place]    Feeling of Stress : Not at all  Social Connections: Not on file    Tobacco Counseling Counseling given: Not Answered Tobacco comments: quit smoking 20+ytrs ago   Clinical Intake:  Pre-visit preparation completed: Yes  Pain : 0-10 Pain Score: 7  Pain Type: Acute pain Pain Location: Ankle Pain Orientation: Left Pain Descriptors / Indicators: Sharp Pain Onset: In the past 7 days Pain Frequency: Intermittent     Nutritional Status: BMI > 30  Obese Nutritional Risks: None Diabetes: Yes  How often do you need to have someone help you when you read instructions, pamphlets, or other written materials from your doctor or pharmacy?: 1 - Never  Diabetic? Yes Nutrition Risk Assessment:  Has the patient had any N/V/D within the last 2 months?  No  Does the patient have any non-healing wounds?  No  Has the patient had any unintentional weight loss or weight gain?  No   Diabetes:  Is the patient diabetic?  Yes  If diabetic, was a CBG obtained today?  No  Did the patient bring in their glucometer from home?  No  How often do you monitor your CBG's? daily.   Financial Strains and Diabetes Management:  Are you having any financial strains with the device, your supplies or your medication? No .  Does the patient want to be seen by Chronic Care Management for management of their diabetes?  No  Would the patient like to be referred to a Nutritionist or for Diabetic Management?  No   Diabetic Exams:  Diabetic Eye Exam: Overdue for diabetic eye  exam. Pt has been advised about the importance in completing this exam. Patient advised to call and schedule an eye exam. Diabetic Foot Exam: Completed 09/06/2021   Interpreter Needed?: No  Information entered by :: NAllen LPN   Activities of Daily Living    07/18/2022   11:11 AM  In your present state of health, do you have any difficulty performing the following activities:  Hearing? 0  Vision? 0  Difficulty concentrating or making decisions? 1  Walking or climbing stairs? 0  Dressing or bathing? 0  Doing errands, shopping? 0  Preparing Food and eating ?  N  Using the Toilet? N  In the past six months, have you accidently leaked urine? N  Do you have problems with loss of bowel control? N  Managing your Medications? N  Managing your Finances? N  Housekeeping or managing your Housekeeping? N    Patient Care Team: [name], MD as PCP - General (Internal Medicine) Leonie Man, MD as PCP - Cardiology (Cardiology) Conni Slipper, NP as Referring Physician (Nurse Practitioner) Arta Silence, MD as Attending Physician (Gastroenterology) Elayne Snare, MD as Attending Physician (Endocrinology) Matilde Bash, MD as Referring Physician (Neurology) Inocencio Homes, DPM as Consulting Physician (Podiatry) Delice Lesch Lezlie Octave, MD as Consulting Physician (Neurology)  Indicate any recent Medical Services you may have received from other than [facility] providers in the past year (date may be approximate).     Assessment:   This is a routine wellness examination for [name].  Hearing/Vision screen Vision Screening - Comments:: Regular eye exams, [facility]  Dietary issues and exercise activities discussed: Current Exercise Habits: The patient does not participate in regular exercise at present   Goals Addressed             This Visit's Progress    Patient Stated       07/18/2022, working on heart       Depression Screen    07/18/2022   11:10 AM 06/14/2022    4:02 PM  03/15/2022    4:06 PM 12/12/2021    4:14 PM 07/06/2021    3:18 PM 04/10/2021   11:58 AM 01/09/2021    2:48 PM  PHQ 2/9 Scores  PHQ - 2 Score 0 0 4 0 2 0 1  PHQ- 9 Score  0 6 0 '8 11 8    '$ Fall Risk    07/18/2022   11:09 AM 07/12/2022    3:51 PM 04/20/2022    4:04 PM 09/12/2021   10:53 AM 07/06/2021    3:17 PM  Fall Risk   Falls in the past year? 1 0 '1 1 1  '$ Comment passed out    lost balance, slipped off bed  Number falls in past yr: 1 0 0 0 1  Injury with Fall? 0 0 0 0 0  Risk for fall due to : Impaired mobility;Medication side effect No Fall Risks   Impaired balance/gait;Impaired mobility;Medication side effect  Follow up Falls prevention discussed;Education provided;Falls evaluation completed Falls evaluation completed Falls evaluation completed  Falls evaluation completed;Education provided;Falls prevention discussed    FALL RISK PREVENTION PERTAINING TO THE HOME:  Any stairs in or around the home? Yes  If so, are there any without handrails? No  Home free of loose throw rugs in walkways, pet beds, electrical cords, etc? Yes  Adequate lighting in your home to reduce risk of falls? Yes   ASSISTIVE DEVICES UTILIZED TO PREVENT FALLS:  Life alert? No  Use of a cane, walker or w/c? Yes  Grab bars in the bathroom? No  Shower chair or bench in shower? Yes  Elevated toilet seat or a handicapped toilet? Yes   TIMED UP AND GO:  Was the test performed? No .      Cognitive Function:        07/18/2022   11:13 AM 07/06/2021    3:25 PM 08/11/2020    4:04 PM 01/06/2020   11:30 AM 10/22/2018   11:16 AM  6CIT Screen  What Year? 0 points 0 points 0 points 0 points 0 points  What month? 0 points 0  points 0 points 0 points 0 points  What time? 0 points 0 points 3 points 0 points 0 points  Count back from 20 4 points 2 points 2 points 2 points 2 points  Months in reverse 0 points 0 points 0 points 0 points 0 points  Repeat phrase 4 points 0 points 0 points 4 points 0 points  Total  Score 8 points 2 points 5 points 6 points 2 points    Immunizations Immunization History  Administered Date(s) Administered   Fluad Quad(high Dose 65+) 06/06/2020, 03/27/2021, 03/15/2022   Influenza Split 04/11/2011, 06/30/2013   Influenza, High Dose Seasonal PF 02/14/2017, 04/24/2018   Influenza-Unspecified 04/07/2014   PFIZER(Purple Top)SARS-COV-2 Vaccination 08/06/2019, 08/31/2019, 05/04/2020, 11/08/2020   PNEUMOCOCCAL CONJUGATE-20 11/08/2020   Pfizer Covid-19 Vaccine Bivalent Booster 57yr & up 07/07/2021   Pneumococcal Polysaccharide-23 06/25/2010   Tdap 05/18/2012   Zoster Recombinat (Shingrix) 07/07/2021, 12/12/2021   Zoster, Live 04/14/2014    TDAP status: Up to date  Flu Vaccine status: Up to date  Pneumococcal vaccine status: Up to date  Covid-19 vaccine status: Completed vaccines  Qualifies for Shingles Vaccine? Yes   Zostavax completed Yes   Shingrix Completed?: Yes  Screening Tests Health Maintenance  Topic Date Due   OPHTHALMOLOGY EXAM  02/13/2022   COVID-19 Vaccine (6 - 2023-24 season) 02/23/2022   Medicare Annual Wellness (AWV)  07/06/2022   Diabetic kidney evaluation - Urine ACR  08/17/2022   MAMMOGRAM  07/27/2022   FOOT EXAM  09/07/2022   HEMOGLOBIN A1C  12/14/2022   Diabetic kidney evaluation - eGFR measurement  07/06/2023   COLONOSCOPY (Pts 45-494yrInsurance coverage will need to be confirmed)  04/04/2031   Pneumonia Vaccine 6568Years old  Completed   INFLUENZA VACCINE  Completed   DEXA SCAN  Completed   Hepatitis C Screening  Completed   Zoster Vaccines- Shingrix  Completed   HPV VACCINES  Aged Out   DTaP/Tdap/Td  Discontinued    Health Maintenance  Health Maintenance Due  Topic Date Due   OPHTHALMOLOGY EXAM  02/13/2022   COVID-19 Vaccine (6 - 2023-24 season) 02/23/2022   Medicare Annual Wellness (AWV)  07/06/2022   Diabetic kidney evaluation - Urine ACR  08/17/2022    Colorectal cancer screening: Type of screening: Colonoscopy.  Completed 04/03/2021. Repeat every 10 years  Mammogram status: Completed 07/27/2021. Repeat every year  Bone Density status: Completed 03/30/2013.   Lung Cancer Screening: (Low Dose CT Chest recommended if Age 75-80ears, 30 pack-year currently smoking OR have quit w/in 15years.) does not qualify.   Lung Cancer Screening Referral: no  Additional Screening:  Hepatitis C Screening: does qualify; Completed 08/13/2010  Vision Screening: Recommended annual ophthalmology exams for early detection of glaucoma and other disorders of the eye. Is the patient up to date with their annual eye exam?  Yes  Who is the provider or what is the name of the office in which the patient attends annual eye exams? [facility] If pt is not established with a provider, would they like to be referred to a provider to establish care? No .   Dental Screening: Recommended annual dental exams for proper oral hygiene  Community Resource Referral / Chronic Care Management: CRR required this visit?  No   CCM required this visit?  No      Plan:     I have personally reviewed and noted the following in the patient's chart:   Medical and social history Use of alcohol, tobacco or illicit  drugs  Current medications and supplements including opioid prescriptions. Patient is not currently taking opioid prescriptions. Functional ability and status Nutritional status Physical activity Advanced directives List of other physicians Hospitalizations, surgeries, and ER visits in previous 12 months Vitals Screenings to include cognitive, depression, and falls Referrals and appointments  In addition, I have reviewed and discussed with patient certain preventive protocols, quality metrics, and best practice recommendations. A written personalized care plan for preventive services as well as general preventive health recommendations were provided to patient.     [name], LPN   [id number]   Nurse Notes:  none  Due to this being a virtual visit, the after visit summary with patients personalized plan was offered to patient via mail or my-chart.  to pick up at office at next visit

## 2022-07-23 ENCOUNTER — Telehealth (HOSPITAL_COMMUNITY): Payer: Self-pay | Admitting: Cardiology

## 2022-07-23 DIAGNOSIS — Z8719 Personal history of other diseases of the digestive system: Secondary | ICD-10-CM

## 2022-07-23 NOTE — Telephone Encounter (Signed)
Pt called to report rectal bleeding 1/26 and 1/27 (loose bloody stools) Bleeding has since tapered off However with pts blood thinner she wanted to be sure to let provider know    DCCV on 2/7

## 2022-07-23 NOTE — Telephone Encounter (Signed)
She needs CBC and also should get an appointment with GI to evaluate.

## 2022-07-23 NOTE — Telephone Encounter (Signed)
LMOM

## 2022-07-23 NOTE — Telephone Encounter (Signed)
Pt aware Labs 1/30 Reports she will contact GI ([facility]Outlaw) for follow up

## 2022-07-23 NOTE — Addendum Note (Signed)
Addended by: Kerry Dory on: 07/23/2022 03:43 PM   Modules accepted: Orders

## 2022-07-24 ENCOUNTER — Ambulatory Visit (HOSPITAL_COMMUNITY)
Admission: RE | Admit: 2022-07-24 | Discharge: 2022-07-24 | Disposition: A | Discharge status: Home / Self Care | Payer: Medicare HMO | Source: Ambulatory Visit | Attending: Cardiology | Admitting: Cardiology

## 2022-07-24 ENCOUNTER — Telehealth: Payer: Self-pay

## 2022-07-24 DIAGNOSIS — Z8719 Personal history of other diseases of the digestive system: Secondary | ICD-10-CM | POA: Diagnosis not present

## 2022-07-24 LAB — CBC
HCT: 23.3 % — ABNORMAL LOW (ref 36.0–46.0)
Hemoglobin: 7 g/dL — ABNORMAL LOW (ref 12.0–15.0)
MCH: 22.2 pg — ABNORMAL LOW (ref 26.0–34.0)
MCHC: 30 g/dL (ref 30.0–36.0)
MCV: 73.7 fL — ABNORMAL LOW (ref 80.0–100.0)
Platelets: 254 10*3/uL (ref 150–400)
RBC: 3.16 MIL/uL — ABNORMAL LOW (ref 3.87–5.11)
RDW: 18.5 % — ABNORMAL HIGH (ref 11.5–15.5)
WBC: 4 10*3/uL (ref 4.0–10.5)
nRBC: 0 % (ref 0.0–0.2)

## 2022-07-24 NOTE — Progress Notes (Signed)
  Chronic Care Management   Note  07/24/2022 Name: ARAYNA ILLESCAS MRN: [id number] DOB: Aug 16, 1945  NIYANNA ASCH is a 78 y.o. year old female who is a primary care patient of [name], MD. I reached out to [name] by phone today in response to a referral sent by Ms. Charlynn Grimes Train's PCP.  The first contact attempt was unsuccessful.   Follow up plan: Additional outreach attempts will be made.  Noreene Larsson, [place], [place] [address] Direct Dial: [phone] [email or web address]

## 2022-07-25 ENCOUNTER — Observation Stay (HOSPITAL_COMMUNITY)
Admission: EM | Admit: 2022-07-25 | Discharge: 2022-07-27 | Disposition: A | Discharge status: Home / Self Care | Payer: Medicare HMO | Attending: Internal Medicine | Admitting: Internal Medicine

## 2022-07-25 ENCOUNTER — Other Ambulatory Visit: Payer: Self-pay

## 2022-07-25 ENCOUNTER — Telehealth (HOSPITAL_COMMUNITY): Payer: Self-pay

## 2022-07-25 ENCOUNTER — Encounter (HOSPITAL_COMMUNITY): Payer: Self-pay

## 2022-07-25 ENCOUNTER — Other Ambulatory Visit (HOSPITAL_COMMUNITY): Payer: Self-pay | Admitting: Family Medicine

## 2022-07-25 DIAGNOSIS — N183 Chronic kidney disease, stage 3 unspecified: Secondary | ICD-10-CM

## 2022-07-25 DIAGNOSIS — N182 Chronic kidney disease, stage 2 (mild): Secondary | ICD-10-CM | POA: Diagnosis not present

## 2022-07-25 DIAGNOSIS — I5032 Chronic diastolic (congestive) heart failure: Secondary | ICD-10-CM | POA: Diagnosis not present

## 2022-07-25 DIAGNOSIS — T43505A Adverse effect of unspecified antipsychotics and neuroleptics, initial encounter: Secondary | ICD-10-CM | POA: Diagnosis present

## 2022-07-25 DIAGNOSIS — E1122 Type 2 diabetes mellitus with diabetic chronic kidney disease: Secondary | ICD-10-CM | POA: Diagnosis not present

## 2022-07-25 DIAGNOSIS — G2111 Neuroleptic induced parkinsonism: Secondary | ICD-10-CM | POA: Diagnosis present

## 2022-07-25 DIAGNOSIS — I110 Hypertensive heart disease with heart failure: Secondary | ICD-10-CM | POA: Diagnosis not present

## 2022-07-25 DIAGNOSIS — Z79899 Other long term (current) drug therapy: Secondary | ICD-10-CM | POA: Insufficient documentation

## 2022-07-25 DIAGNOSIS — Z87891 Personal history of nicotine dependence: Secondary | ICD-10-CM | POA: Diagnosis not present

## 2022-07-25 DIAGNOSIS — E785 Hyperlipidemia, unspecified: Secondary | ICD-10-CM | POA: Diagnosis not present

## 2022-07-25 DIAGNOSIS — J449 Chronic obstructive pulmonary disease, unspecified: Secondary | ICD-10-CM | POA: Diagnosis not present

## 2022-07-25 DIAGNOSIS — Z794 Long term (current) use of insulin: Secondary | ICD-10-CM | POA: Diagnosis not present

## 2022-07-25 DIAGNOSIS — I130 Hypertensive heart and chronic kidney disease with heart failure and stage 1 through stage 4 chronic kidney disease, or unspecified chronic kidney disease: Secondary | ICD-10-CM | POA: Insufficient documentation

## 2022-07-25 DIAGNOSIS — K625 Hemorrhage of anus and rectum: Secondary | ICD-10-CM | POA: Diagnosis present

## 2022-07-25 DIAGNOSIS — G4733 Obstructive sleep apnea (adult) (pediatric): Secondary | ICD-10-CM | POA: Diagnosis not present

## 2022-07-25 DIAGNOSIS — Z6841 Body Mass Index (BMI) 40.0 and over, adult: Secondary | ICD-10-CM

## 2022-07-25 DIAGNOSIS — G20A1 Parkinson's disease without dyskinesia, without mention of fluctuations: Secondary | ICD-10-CM | POA: Diagnosis not present

## 2022-07-25 DIAGNOSIS — K219 Gastro-esophageal reflux disease without esophagitis: Secondary | ICD-10-CM | POA: Diagnosis not present

## 2022-07-25 DIAGNOSIS — E119 Type 2 diabetes mellitus without complications: Secondary | ICD-10-CM

## 2022-07-25 DIAGNOSIS — D649 Anemia, unspecified: Secondary | ICD-10-CM | POA: Insufficient documentation

## 2022-07-25 DIAGNOSIS — K922 Gastrointestinal hemorrhage, unspecified: Secondary | ICD-10-CM | POA: Diagnosis not present

## 2022-07-25 DIAGNOSIS — G40909 Epilepsy, unspecified, not intractable, without status epilepticus: Secondary | ICD-10-CM

## 2022-07-25 DIAGNOSIS — E662 Morbid (severe) obesity with alveolar hypoventilation: Secondary | ICD-10-CM | POA: Diagnosis not present

## 2022-07-25 DIAGNOSIS — E1151 Type 2 diabetes mellitus with diabetic peripheral angiopathy without gangrene: Secondary | ICD-10-CM | POA: Insufficient documentation

## 2022-07-25 DIAGNOSIS — Z8616 Personal history of COVID-19: Secondary | ICD-10-CM | POA: Diagnosis not present

## 2022-07-25 LAB — PREPARE RBC (CROSSMATCH)

## 2022-07-25 LAB — CBC WITH DIFFERENTIAL/PLATELET
Abs Immature Granulocytes: 0.01 10*3/uL (ref 0.00–0.07)
Basophils Absolute: 0 10*3/uL (ref 0.0–0.1)
Basophils Relative: 1 %
Eosinophils Absolute: 0.2 10*3/uL (ref 0.0–0.5)
Eosinophils Relative: 5 %
HCT: 23.7 % — ABNORMAL LOW (ref 36.0–46.0)
Hemoglobin: 7.4 g/dL — ABNORMAL LOW (ref 12.0–15.0)
Immature Granulocytes: 0 %
Lymphocytes Relative: 37 %
Lymphs Abs: 1.2 10*3/uL (ref 0.7–4.0)
MCH: 22.7 pg — ABNORMAL LOW (ref 26.0–34.0)
MCHC: 31.2 g/dL (ref 30.0–36.0)
MCV: 72.7 fL — ABNORMAL LOW (ref 80.0–100.0)
Monocytes Absolute: 0.3 10*3/uL (ref 0.1–1.0)
Monocytes Relative: 9 %
Neutro Abs: 1.5 10*3/uL — ABNORMAL LOW (ref 1.7–7.7)
Neutrophils Relative %: 48 %
Platelets: 293 10*3/uL (ref 150–400)
RBC: 3.26 MIL/uL — ABNORMAL LOW (ref 3.87–5.11)
RDW: 18.7 % — ABNORMAL HIGH (ref 11.5–15.5)
WBC: 3.2 10*3/uL — ABNORMAL LOW (ref 4.0–10.5)
nRBC: 0 % (ref 0.0–0.2)

## 2022-07-25 LAB — COMPREHENSIVE METABOLIC PANEL
ALT: 9 U/L (ref 0–44)
AST: 17 U/L (ref 15–41)
Albumin: 3.3 g/dL — ABNORMAL LOW (ref 3.5–5.0)
Alkaline Phosphatase: 83 U/L (ref 38–126)
Anion gap: 7 (ref 5–15)
BUN: 9 mg/dL (ref 8–23)
CO2: 30 mmol/L (ref 22–32)
Calcium: 8.7 mg/dL — ABNORMAL LOW (ref 8.9–10.3)
Chloride: 99 mmol/L (ref 98–111)
Creatinine, Ser: 1 mg/dL (ref 0.44–1.00)
GFR, Estimated: 59 mL/min — ABNORMAL LOW (ref >60)
Glucose, Bld: 113 mg/dL — ABNORMAL HIGH (ref 70–99)
Potassium: 3.9 mmol/L (ref 3.5–5.1)
Sodium: 136 mmol/L (ref 135–145)
Total Bilirubin: 0.2 mg/dL — ABNORMAL LOW (ref 0.3–1.2)
Total Protein: 6.9 g/dL (ref 6.5–8.1)

## 2022-07-25 LAB — GLUCOSE, CAPILLARY: Glucose-Capillary: 110 mg/dL — ABNORMAL HIGH (ref 70–99)

## 2022-07-25 LAB — LIPASE, BLOOD: Lipase: 33 U/L (ref 11–51)

## 2022-07-25 MED ORDER — LEVETIRACETAM 500 MG PO TABS
1000.0000 mg | ORAL_TABLET | Freq: Every day | ORAL | Status: DC
  Administered 2022-07-26: 1000 mg via ORAL
  Filled 2022-07-25: qty 2

## 2022-07-25 MED ORDER — ACETAMINOPHEN 325 MG PO TABS
650.0000 mg | ORAL_TABLET | Freq: Four times a day (QID) | ORAL | Status: DC | PRN
  Administered 2022-07-25 – 2022-07-26 (×2): 650 mg via ORAL
  Filled 2022-07-25 (×2): qty 2

## 2022-07-25 MED ORDER — ATORVASTATIN CALCIUM 10 MG PO TABS
10.0000 mg | ORAL_TABLET | Freq: Every evening | ORAL | Status: DC
  Administered 2022-07-25 – 2022-07-26 (×2): 10 mg via ORAL
  Filled 2022-07-25 (×2): qty 1

## 2022-07-25 MED ORDER — ISOSORBIDE MONONITRATE ER 30 MG PO TB24: 30.0000 mg | ORAL_TABLET | Freq: Every day | ORAL | Status: DC

## 2022-07-25 MED ORDER — ALBUTEROL SULFATE (2.5 MG/3ML) 0.083% IN NEBU: 2.5000 mg | INHALATION_SOLUTION | Freq: Four times a day (QID) | RESPIRATORY_TRACT | Status: DC | PRN

## 2022-07-25 MED ORDER — INSULIN GLARGINE-YFGN 100 UNIT/ML ~~LOC~~ SOLN
10.0000 [IU] | Freq: Every day | SUBCUTANEOUS | Status: DC
  Administered 2022-07-25: 10 [IU] via SUBCUTANEOUS
  Filled 2022-07-25 (×3): qty 0.1

## 2022-07-25 MED ORDER — FUROSEMIDE 20 MG PO TABS
60.0000 mg | ORAL_TABLET | Freq: Two times a day (BID) | ORAL | Status: DC
  Administered 2022-07-25: 60 mg via ORAL
  Filled 2022-07-25: qty 3

## 2022-07-25 MED ORDER — PANTOPRAZOLE SODIUM 40 MG IV SOLR
40.0000 mg | Freq: Once | INTRAVENOUS | Status: AC
  Administered 2022-07-25: 40 mg via INTRAVENOUS
  Filled 2022-07-25: qty 10

## 2022-07-25 MED ORDER — PANTOPRAZOLE INFUSION (NEW) - SIMPLE MED
8.0000 mg/h | INTRAVENOUS | Status: DC
  Administered 2022-07-25 – 2022-07-26 (×2): 8 mg/h via INTRAVENOUS
  Filled 2022-07-25 (×3): qty 100

## 2022-07-25 MED ORDER — SERTRALINE HCL 50 MG PO TABS
50.0000 mg | ORAL_TABLET | Freq: Every day | ORAL | Status: DC
  Administered 2022-07-26 – 2022-07-27 (×2): 50 mg via ORAL
  Filled 2022-07-25 (×2): qty 1

## 2022-07-25 MED ORDER — SODIUM CHLORIDE 0.9% FLUSH
3.0000 mL | Freq: Two times a day (BID) | INTRAVENOUS | Status: DC
  Administered 2022-07-25 – 2022-07-26 (×2): 3 mL via INTRAVENOUS

## 2022-07-25 MED ORDER — INSULIN ASPART 100 UNIT/ML IJ SOLN: 0.0000 [IU] | Freq: Three times a day (TID) | INTRAMUSCULAR | Status: DC

## 2022-07-25 MED ORDER — LAMOTRIGINE 25 MG PO TABS
150.0000 mg | ORAL_TABLET | Freq: Two times a day (BID) | ORAL | Status: DC
  Administered 2022-07-25 – 2022-07-27 (×4): 150 mg via ORAL
  Filled 2022-07-25 (×4): qty 6

## 2022-07-25 MED ORDER — ACETAMINOPHEN 650 MG RE SUPP: 650.0000 mg | Freq: Four times a day (QID) | RECTAL | Status: DC | PRN

## 2022-07-25 MED ORDER — SODIUM CHLORIDE 0.9% IV SOLUTION: Freq: Once | INTRAVENOUS | Status: AC

## 2022-07-25 MED ORDER — CARVEDILOL 12.5 MG PO TABS
12.5000 mg | ORAL_TABLET | Freq: Two times a day (BID) | ORAL | Status: DC
  Administered 2022-07-25: 12.5 mg via ORAL
  Filled 2022-07-25: qty 1

## 2022-07-25 NOTE — H&P (Signed)
History and Physical   [name] YDX:[id number] DOB: [birth year] DOA: 07/25/2022  PCP: [name], MD   Patient coming from: Home  Chief Complaint: Rectal bleeding  HPI: [name] is a 77 y.o. female with medical history significant of diabetes, CKD 2, rheumatoid arthritis, COPD, post-COVID pulmonary fibrosis, seizure disorder, Todd's paralysis, neuroleptic parkinsonism, hypertension, diastolic CHF, OSA, OHS, obesity, GERD, diverticulosis, GI bleeding presenting with rectal bleed.  Patient reports 5 days of ongoing intermittent rectal bleeding.  She initially had an episode when she was out of town where she had an urge to defecate and she had liquid stool with dark red and some bright red mixed in.  She has had recurrent episodes for the past 5 days,2-3 per day.  She is following with cards for a planned atrial flutter cardioversion and had routine labs checked yesterday and noted to have hemoglobin of 7.  They recommended her be seen in the ED for transfusion GI evaluation.  She does report some fatigue.  She denies fevers, chills, chest pain, shortness of breath, abdominal pain, constipation, nausea, vomiting.  ED Course: Vital signs in the ED stable.  Lab workup included CMP with calcium 8.7, albumin 3.3, glucose 113.  CBC with leukopenia at 3.2 and hemoglobin of 7.4 down from 10 a month ago.  FOBT pending.  Lipase normal.  Patient typed and screened in the ED and 1 unit ordered for transfusion.  Patient also ordered IV PPI and IV PPI infusion.  GI consulted and recommended clear liquid diet and will see in the morning unless need to be reconsulted for worsening symptoms prior to this.  Review of Systems: As per HPI otherwise all other systems reviewed and are negative.  Past Medical History:  Diagnosis Date   Abnormal liver function     in the past.   Anemia    Arthritis    all over   Bruises easily    Cataract    right eye;immature   CHF (congestive heart failure)  (HCC)    Chronic back pain    stenosis   Chronic cough    Chronic kidney disease    COPD (chronic obstructive pulmonary disease) ([place])    COVID    aug/sept 2021   Demand myocardial infarction [facility]) 2012   Demand Infarction in setting of Pancreatitis --> mild Troponin elevation, NON-OBSTRUCTIVE CAD   Depression    takes Abilify daily as well as Zoloft   Diastolic heart failure 7209   Grade 1 diastolic Dysfunction by Echo    Diverticulosis    DM (diabetes mellitus) ([place])    takes Victoza daily as well as Lantus and Humalog   Empty sella ([place])    on MRI in 2009.   Glaucoma    Headache(784.0)    last migraine-4-39yr ago   History of blood transfusion    no abnormal reaction noted   History of colon polyps    benign   HTN (hypertension)    takes Benicar,Imdur,and Bystolic daily   Hyperlipidemia    takes Lipitor daily   Joint swelling    Nocturia    Obesity hypoventilation syndrome ([place]    Obstructive sleep apnea 02/2018   Notably improved split-night study with weight loss from 270 (2017) down to 250 pounds (2019).   Pancreatitis    takes Pancrelipase daily   Parkinson's disease    takes Sinemet daily   Peripheral neuropathy    Pneumonia 2012   Seizures ([place]    takes Lamictal  daily and Primidone nightly;last seizure 2wks ago   Urinary urgency    With increased frequency   Varicose veins of both lower extremities with pain    With edema.  Takes daily Lasix    Past Surgical History:  Procedure Laterality Date   ABDOMINAL HYSTERECTOMY     APPENDECTOMY     BACK SURGERY     Cardiac Event Monitor  September-October 2017   Sinus rhythm with occasional PACs and artifact. No arrhythmias besides one short run of tachycardia.   CHOLECYSTECTOMY     COLONOSCOPY N/A 08/15/2012   Procedure: COLONOSCOPY;  Surgeon: Arta Silence, MD;  Location: [facility];  Service: Endoscopy;  Laterality: N/A;   COLONOSCOPY WITH PROPOFOL Left 06/17/2017   Procedure: COLONOSCOPY WITH  PROPOFOL;  Surgeon: [name], MD;  Location: [place];  Service: Gastroenterology;  Laterality: Left;   COLONOSCOPY WITH PROPOFOL N/A 04/03/2021   Procedure: COLONOSCOPY WITH PROPOFOL;  Surgeon: Clarene Essex, MD;  Location: [place];  Service: Endoscopy;  Laterality: N/A;   CORONARY CALCIUM SCORE AND CTA  04/2018   Coronary calcium score 71.9.  Very large, hyperdynamic LAD wrapping apex giving rise to PDA.  Moderate LAD-diagonal and circumflex plaque -> CT FFR suggested positive findings in distal D1, D2 and distal circumflex.  Referred for cath. ==> FALSE POSITIVE   ESOPHAGOGASTRODUODENOSCOPY     eye cysts Bilateral    LASIK     LEFT HEART CATH AND CORONARY ANGIOGRAPHY N/A 05/16/2018   Procedure: LEFT HEART CATH AND CORONARY ANGIOGRAPHY;  Surgeon: Belva Crome, MD;  Location: [facility] INVASIVE CV LAB:  Angiographically normal coronary arteries with LVEDP of 21 mmHg.  Large draping hyperdominant LAD that wraps the apex and provides distal half of the PDA.  Relatively small caliber distal Cx -->proxLPDA, non-dom RCA. No Cx or Diag lesions -- FALSE + CT FFR   LEFT HEART CATH AND CORONARY ANGIOGRAPHY  2009/March 2012   2009: (Dr. Alvie Heidelberg) Nonobstructive CAD; 2012: Minimal CAD --> false positive stress test   NM MYOVIEW LTD  01/2016; 01/2018   a)LOW RISK. No ischemia or infarction;; b) EF 55-60%. NO ST changes.  No ischemia or Infarction. NO significant RV enlargement.  LOW RISK.   POLYPECTOMY  04/03/2021   Procedure: POLYPECTOMY;  Surgeon: Clarene Essex, MD;  Location: [place];  Service: Endoscopy;;   Polysomnogram  02/2018   (Dr. Brett Fairy from neurology): Split study was not adequately done due to low AHI.  She slept reclined and had an AHI less than 10, prolonged hypoxemia and no hypercapnia. -->  She has home oxygen already prescribed, but did not meet criteria for nightly oxygen.  (Was noted that the patient did not cooperate well with study.  Titration was discussed.   PRESSURE  SENSOR/CARDIOMEMS N/A 11/09/2019   Procedure: PRESSURE SENSOR/CARDIOMEMS;  Surgeon: Larey Dresser, MD;  Location: [place] CV LAB;  Service: Cardiovascular;  Laterality: N/A;   RECTAL POLYPECTOMY     RIGHT/LEFT HEART CATH AND CORONARY ANGIOGRAPHY N/A 04/06/2019   Procedure: RIGHT/LEFT HEART CATH AND CORONARY ANGIOGRAPHY;  Surgeon: Leonie Man, MD;  [facility] INVASIVE CV LAB; angiographically normal coronary arteries.  Moderately elevated AD LVEDP..  Mild pulmonary hypertension: PA P 56/14 mmHg-mean 31 mmHg.  RAP 10 mmHg.  RV P-EDP 54/4 mmHg - 12 mmHg.  PCWP 11-15 mmHg.  CO-CI: 7.35-3.73.   TONSILLECTOMY     TRANSTHORACIC ECHOCARDIOGRAM  01/2016; 05/2017   A) Normal LV chamber size with mod LVH pattern. EF 50-55%. Severe LA  dilation. Mod RA dilation. PAP elevated at 38 mmHg;; B) Mild concentric LVH.  EF 55-60% & no RWMA.  Grade 2 DD.  Diastolic flattening of the ventricular septum == ? elevated PAP.  Mild aortic valve calcification/sclerosis.  Mod LA dilation.  Mild RV dilation & Mod RA dilation   TRANSTHORACIC ECHOCARDIOGRAM  10/2017; 01/2018   a) EF 55-60%.  No RWMA,  Moderate LA dilation.  Mild LA dilation.  Peak PA pressures ~57 mmHg (moderate);;; b) EF 55-60%.  GRII DD.  Suggestion of RV volume overload with moderate RV dilation.  Severe LA and RA dilation.Marland Kitchen  PA pressure ~67%.   TRANSTHORACIC ECHOCARDIOGRAM  01/2019   Normal LV size and function.  EF 50 to 55%.  Impaired relaxation.  RV appears to have moderately reduced function.  Severely enlarged.  Cannot fully assess RV pressures.  Hypermobile interatrial septum.  Right atrium severely dilated.   TUBAL LIGATION     vaginal cyst removed     several times    Social History  reports that she has quit smoking. Her smoking use included cigarettes. She has a 12.50 pack-year smoking history. She has never used smokeless tobacco. She reports that she does not drink alcohol and does not use drugs.  Allergies  Allergen Reactions    Other Anaphylaxis and Rash    Bleach   Penicillins Hives    Did it involve swelling of the face/tongue/throat, SOB, or low BP? No Did it involve sudden or severe rash/hives, skin peeling, or any reaction on the inside of your mouth or nose? Yes Did you need to seek medical attention at a hospital or doctor's office? Yes When did it last happen?      Childhood allergy  If all above answers are "NO", may proceed with cephalosporin use.     Sulfa Antibiotics Hives   Aspirin Other (See Comments)     On aspirin 81 mg - Rectal bleeding in dec 2018   Codeine     Headache and makes the patient feel "off"    Family History  Problem Relation Age of Onset   Allergies Father    Heart disease Father        before 27   Heart failure Father    Hypertension Father    Hyperlipidemia Father    Heart disease Mother    Diabetes Mother    Hypertension Mother    Hyperlipidemia Mother    Hypertension Sister    Heart disease Sister        before 72   Hyperlipidemia Sister    Diabetes Brother    Hypertension Brother    Diabetes Sister    Hypertension Sister    Diabetes Son    Hypertension Son    Cancer Other   Reviewed on admission  Prior to Admission medications   Medication Sig Start Date End Date Taking? Authorizing Provider  acetaminophen (TYLENOL) 500 MG tablet Take 500-1,000 mg by mouth every 4 (four) hours as needed for moderate pain.  Patient not taking: Reported on 07/18/2022    [provider]  albuterol (VENTOLIN HFA) 108 (90 Base) MCG/ACT inhaler Inhale 1-2 puffs into the lungs every 6 (six) hours as needed for wheezing or shortness of breath. 06/08/21   Marshell Garfinkel, MD  Alcohol Swabs (DROPSAFE ALCOHOL PREP) 70 % PADS USE TWO TIMES DAILY AS DIRECTED 09/25/21   [name], MD  Ascorbic Acid (VITAMIN C) 1000 MG tablet Take 1,000 mg by mouth daily.    [provider]  atorvastatin (LIPITOR) 10 MG tablet TAKE 1 TABLET EVERY EVENING 03/01/22   [name], MD   B-D ULTRAFINE III SHORT PEN 31G X 8 MM MISC USE DAILY WITH VICTOZA AND/OR NOVOLOG. 09/25/21   [name], MD  benzonatate (TESSALON) 100 MG capsule Take 1 capsule (100 mg total) by mouth 3 (three) times daily as needed for cough. 04/23/22   [name], MD  Blood Glucose Monitoring Suppl (TRUE METRIX METER) w/Device KIT USE AS DIRECTED 05/08/21   [name], MD  carvedilol (COREG) 12.5 MG tablet Take 1 tablet (12.5 mg total) by mouth 2 (two) times daily. 05/30/21 11/02/22  Larey Dresser, MD  dexlansoprazole (DEXILANT) 60 MG capsule Take 1 capsule (60 mg total) by mouth daily. Patient not taking: Reported on 07/18/2022 04/23/22   [name], MD  diclofenac sodium (VOLTAREN) 1 % GEL Apply 2 g topically 4 (four) times daily as needed (pain). 05/17/18   Geradine Girt, DO  ferrous sulfate 324 MG TBEC Patient takes 1 tablet by mouth every other day.    [provider]  furosemide (LASIX) 20 MG tablet TAKE 4 TABLETS (80 MG) BY MOUTH IN THE MORNING AND 3 TABLETS (60 MG) EVERY EVENING. 07/25/22   [place], [place], FNP  Glucagon (GVOKE HYPOPEN 2-PACK) 0.5 MG/0.1ML SOAJ INJECT 0.5 MG INTO THE SKIN DAILY AS NEEDED. 05/19/22   [name], MD  guaiFENesin-dextromethorphan [facility] DM) 100-10 MG/5ML syrup Take 10 mLs by mouth every 6 (six) hours as needed for cough. 11/18/20   [place] Donnamarie Poag, DO  hydrALAZINE (APRESOLINE) 100 MG tablet Take 1 tablet (100 mg total) by mouth 3 (three) times daily. 05/04/21   Larey Dresser, MD  hydrocortisone cream 1 % Apply 1 application topically daily as needed for itching.     [provider]  insulin glargine, 1 Unit Dial, (TOUJEO SOLOSTAR) 300 UNIT/ML Solostar Pen INJECT 36 UNITS INTO THE SKIN AT BEDTIME. 05/07/22   Minette Brine, FNP  insulin lispro (HUMALOG KWIKPEN) 100 UNIT/ML KwikPen Inject 0.02-0.04 mLs (2-4 Units total) into the skin 3 (three) times daily as needed (high blood sugar over 150). 11/23/20   Bary Castilla, NP   Isopropyl Alcohol (ALCOHOL WIPES) 70 % MISC Apply 1 each topically 2 (two) times daily. 06/25/19   Minette Brine, FNP  isosorbide mononitrate (IMDUR) 30 MG 24 hr tablet TAKE 1 TABLET EVERY DAY 01/01/22   Leonie Man, MD  lamoTRIgine (LAMICTAL) 150 MG tablet Take 1 tablet (150 mg total) by mouth 2 (two) times daily. 04/20/22   Cameron Sprang, MD  levETIRAcetam (KEPPRA) 500 MG tablet Take 2 tablets every night 04/20/22   Cameron Sprang, MD  ondansetron (ZOFRAN) 4 MG tablet Take 1 tablet (4 mg total) by mouth every 8 (eight) hours as needed for nausea or vomiting. 12/20/21   [name], MD  OZEMPIC, 1 MG/DOSE, 4 MG/3ML SOPN INJECT 1 MG INTO THE SKIN ONCE A WEEK. 11/02/21   [name], MD  Pancrelipase, Lip-Prot-Amyl, 25000-79000 units CPEP Take 1-2 capsules by mouth See admin instructions. Take 1 capsules in the morning with breakfast, 1 capsule with lunch, and 2 capsules with supper    [provider]  potassium chloride SA (KLOR-CON M) 20 MEQ tablet Take 2 tablets (40 mEq total) by mouth 2 (two) times daily. With additional 60 on Metolazone days 83/66/29   Delora Fuel, MD  sennosides-docusate sodium (SENOKOT-S) 8.6-50 MG tablet Take 1-2 tablets by mouth at bedtime as needed for  constipation.    [provider]  sertraline (ZOLOFT) 50 MG tablet TAKE 1 TABLET EVERY DAY 09/25/21   [name], MD  tiZANidine (ZANAFLEX) 2 MG tablet TAKE 1 TABLET AT BEDTIME AS NEEDED 07/16/22   [name], MD  tolnaftate (TINACTIN) 1 % cream Apply 1 application topically daily as needed (foot fungus).     [provider]  TRUE METRIX BLOOD GLUCOSE TEST test strip USE AS DIRECTED TO CHECK BLOOD SUGARS 2 TIMES PER DAY 04/25/22   [name], MD  TRUEplus Lancets 33G MISC TEST BLOOD SUGAR TWICE DAILY AS DIRECTED 11/11/21   [name], MD    Physical Exam: Vitals:   07/25/22 1630 07/25/22 1654 07/25/22 1700 07/25/22 1712  BP: 122/63 121/64 (!) 115/98 117/66  Pulse: 77 75  73 72  Resp: '14 19 16 14  '$ Temp:  98.7 F (37.1 C)  98.7 F (37.1 C)  TempSrc:  Oral  Oral  SpO2: 98% 97% 96% 95%  Weight:      Height:        Physical Exam Constitutional:      General: She is not in acute distress.    Appearance: Normal appearance.  HENT:     Head: Normocephalic and atraumatic.     Mouth/Throat:     Mouth: Mucous membranes are moist.     Pharynx: Oropharynx is clear.  Eyes:     Extraocular Movements: Extraocular movements intact.     Pupils: Pupils are equal, round, and reactive to light.  Cardiovascular:     Rate and Rhythm: Normal rate and regular rhythm.     Pulses: Normal pulses.     Heart sounds: Normal heart sounds.  Pulmonary:     Effort: Pulmonary effort is normal. No respiratory distress.     Breath sounds: Normal breath sounds.  Abdominal:     General: Bowel sounds are normal. There is no distension.     Palpations: Abdomen is soft.     Tenderness: There is no abdominal tenderness.  Musculoskeletal:        General: No swelling or deformity.  Skin:    General: Skin is warm and dry.  Neurological:     General: No focal deficit present.     Mental Status: Mental status is at baseline.    Labs on Admission: I have personally reviewed following labs and imaging studies  CBC: Recent Labs  Lab 07/24/22 1145 07/25/22 1443  WBC 4.0 3.2*  NEUTROABS  --  1.5*  HGB 7.0* 7.4*  HCT 23.3* 23.7*  MCV 73.7* 72.7*  PLT 254 024    Basic Metabolic Panel: Recent Labs  Lab 07/25/22 1443  NA 136  K 3.9  CL 99  CO2 30  GLUCOSE 113*  BUN 9  CREATININE 1.00  CALCIUM 8.7*    GFR: Estimated Creatinine Clearance: 47.5 mL/min (by C-G formula based on SCr of 1 mg/dL).  Liver Function Tests: Recent Labs  Lab 07/25/22 1443  AST 17  ALT 9  ALKPHOS 83  BILITOT 0.2*  PROT 6.9  ALBUMIN 3.3*    Urine analysis:    Component Value Date/Time   COLORURINE STRAW (A) 07/06/2022 0142   APPEARANCEUR CLEAR 07/06/2022 0142   LABSPEC 1.006  07/06/2022 0142   PHURINE 6.0 07/06/2022 0142   GLUCOSEU NEGATIVE 07/06/2022 0142   GLUCOSEU NEGATIVE 04/17/2016 0955   HGBUR NEGATIVE 07/06/2022 0142   BILIRUBINUR NEGATIVE 07/06/2022 0142   BILIRUBINUR Negative 12/06/2020 [place] 07/06/2022 0142  PROTEINUR NEGATIVE 07/06/2022 0142   UROBILINOGEN 0.2 12/06/2020 1701   UROBILINOGEN 0.2 04/17/2016 0955   NITRITE NEGATIVE 07/06/2022 0142   LEUKOCYTESUR NEGATIVE 07/06/2022 0142    Radiological Exams on Admission: No results found.  EKG: Not performed in the emergency department  Assessment/Plan Principal Problem:   GI bleed Active Problems:   Obesity hypoventilation syndrome (HCC)   Hypertensive heart disease with chronic diastolic congestive heart failure (HCC)   Dyslipidemia, goal LDL below 100   Morbid obesity with BMI of 45.0-49.9, adult (HCC)   DM2 (diabetes mellitus, type 2) (HCC)   Seizure disorder (HCC)   Chronic diastolic CHF (congestive heart failure), NYHA class 2 (HCC)   OSA on CPAP   Recurrent depression (HCC)   Rheumatoid arthritis (HCC)   Neuroleptic-induced parkinsonism (HCC)   Gastroesophageal reflux   Chronic kidney disease, stage 2 (mild)   GI bleed > Presenting with dark red and bright red blood per rectum with loose stools.  > Known history of GERD, diverticulosis, GI bleeding. > Outpatient labs recently showed hemoglobin 7 which confirmed to be 7.4 in the ED, down from 10 a month ago. > GI consulted in the ED and will see the patient in the morning.  Recommending clear liquid diet.  Reconsult if needed sooner. > 1 unit ordered for transfusion in the ED. - Appreciate GI recommendations - Monitor telemetry - Clear liquid diet - Continue with 1 unit transfusion - Trend hemoglobin  Seizure disorder > History of Todd's paralysis and neuroleptic induced parkinsonism CKD 2 > Creatinine stable in the ED - Continue to trend renal function and electrolytes  Diastolic CHF > Last echo  in 2022 with EF 60-65%, G1 DD, normal RV function. - Continue home Lasix, carvedilol, Imdur  Hypertension > BP stable in the ED. - Continue home carvedilol, Lasix as above - Holding hold hydralazine as patient has possible active bleed  Diabetes > 20 units long-acting and sliding scale outpatient - 10 units nightly - SSI  Hyperlipidemia - Continue home statin  OSA OHS - Continue home CPAP  Obesity - Noted  DVT prophylaxis: SCDs Code Status:   Full, discussed with the patient Family Communication:  , Admission.  Patient states family is aware of admission. Disposition Plan:   Patient is from:  Home  Anticipated DC to:  Home  Anticipated DC date:  1 to 3 days  Anticipated DC barriers: None  Consults called:  Gastroenterology, Dr. Watt Climes Admission status:  Observation, telemetry  Severity of Illness: The appropriate patient status for this patient is OBSERVATION. Observation status is judged to be reasonable and necessary in order to provide the required intensity of service to ensure the patient's safety. The patient's presenting symptoms, physical exam findings, and initial radiographic and laboratory data in the context of their medical condition is felt to place them at decreased risk for further clinical deterioration. Furthermore, it is anticipated that the patient will be medically stable for discharge from the hospital within 2 midnights of admission.    Marcelyn Bruins MD [facility]  How to contact the [facility] Attending or Consulting provider [place] or covering provider during after hours [place], for this patient?   Check the care team in [facility] and look for a) attending/consulting [facility] provider listed and b) the [facility] team listed Log into [email or web address] and use [place]'s universal password to access. If you do not have the password, please contact the hospital operator. Locate the [facility] provider you are looking for  under [facility] and page to a number  that you can be directly reached. If you still have difficulty reaching the provider, please page the [facility] (Director on Call) for the Hospitalists listed on amion for assistance.  07/25/2022, 5:30 PM

## 2022-07-25 NOTE — Addendum Note (Signed)
Addended by: Jerl Mina on: 07/25/2022 03:18 PM   Modules accepted: Orders

## 2022-07-25 NOTE — ED Provider Notes (Signed)
[facility] Provider Note   CSN: [id number] Arrival date & time: 07/25/22  1406     History  Chief Complaint  Patient presents with   Rectal Bleeding    [name] is a 77 y.o. female.  Patient presents to the emergency department complaining of rectal bleeding since Friday of last week.  Patient states that she was out of town on Friday of last week and began to have the urge to defecate.  She states it was mostly liquid and appeared dark red with some bright red mixed in.  She states she has had continued episodes of this since the weekend.  Patient had blood work conducted by cardiology apparently in preparation for an upcoming cardioversion.  Atrial flutter first noticed in January of this year in the emergency department.  Outpatient visit with cardiology showed patient in atrial fibrillation.  Blood work performed outside of the emergency department showed hemoglobin of 7.0 yesterday.  She was advised to come to the emergency department for likely transfusion of blood and further evaluation by gastroenterology.  Past medical history significant for diastolic heart failure, type II DM, peripheral neuropathy, diverticulosis, chronic kidney disease, hypertension, Parkinson's disease, seizures, history of colonic polyps, COPD  HPI     Home Medications Prior to Admission medications   Medication Sig Start Date End Date Taking? Authorizing Provider  acetaminophen (TYLENOL) 500 MG tablet Take 500-1,000 mg by mouth every 4 (four) hours as needed for moderate pain.  Patient not taking: Reported on 07/18/2022    [provider]  albuterol (VENTOLIN HFA) 108 (90 Base) MCG/ACT inhaler Inhale 1-2 puffs into the lungs every 6 (six) hours as needed for wheezing or shortness of breath. 06/08/21   Marshell Garfinkel, MD  Alcohol Swabs (DROPSAFE ALCOHOL PREP) 70 % PADS USE TWO TIMES DAILY AS DIRECTED 09/25/21   [name], MD  Ascorbic Acid  (VITAMIN C) 1000 MG tablet Take 1,000 mg by mouth daily.    [provider]  atorvastatin (LIPITOR) 10 MG tablet TAKE 1 TABLET EVERY EVENING 03/01/22   [name], MD  B-D ULTRAFINE III SHORT PEN 31G X 8 MM MISC USE DAILY WITH VICTOZA AND/OR NOVOLOG. 09/25/21   [name], MD  benzonatate (TESSALON) 100 MG capsule Take 1 capsule (100 mg total) by mouth 3 (three) times daily as needed for cough. 04/23/22   [name], MD  Blood Glucose Monitoring Suppl (TRUE METRIX METER) w/Device KIT USE AS DIRECTED 05/08/21   [name], MD  carvedilol (COREG) 12.5 MG tablet Take 1 tablet (12.5 mg total) by mouth 2 (two) times daily. 05/30/21 11/02/22  Larey Dresser, MD  dexlansoprazole (DEXILANT) 60 MG capsule Take 1 capsule (60 mg total) by mouth daily. Patient not taking: Reported on 07/18/2022 04/23/22   [name], MD  diclofenac sodium (VOLTAREN) 1 % GEL Apply 2 g topically 4 (four) times daily as needed (pain). 05/17/18   Geradine Girt, DO  ferrous sulfate 324 MG TBEC Patient takes 1 tablet by mouth every other day.    [provider]  furosemide (LASIX) 20 MG tablet TAKE 4 TABLETS (80 MG) BY MOUTH IN THE MORNING AND 3 TABLETS (60 MG) EVERY EVENING. 07/25/22   [place], Perkins, FNP  Glucagon (GVOKE HYPOPEN 2-PACK) 0.5 MG/0.1ML SOAJ INJECT 0.5 MG INTO THE SKIN DAILY AS NEEDED. 05/19/22   [name], MD  guaiFENesin-dextromethorphan (ROBITUSSIN DM) 100-10 MG/5ML syrup Take 10 mLs by mouth every 6 (six)  hours as needed for cough. 11/18/20   [place] Donnamarie Poag, DO  hydrALAZINE (APRESOLINE) 100 MG tablet Take 1 tablet (100 mg total) by mouth 3 (three) times daily. 05/04/21   Larey Dresser, MD  hydrocortisone cream 1 % Apply 1 application topically daily as needed for itching.     [provider]  insulin glargine, 1 Unit Dial, (TOUJEO SOLOSTAR) 300 UNIT/ML Solostar Pen INJECT 36 UNITS INTO THE SKIN AT BEDTIME. 05/07/22   Minette Brine, FNP  insulin lispro (HUMALOG  KWIKPEN) 100 UNIT/ML KwikPen Inject 0.02-0.04 mLs (2-4 Units total) into the skin 3 (three) times daily as needed (high blood sugar over 150). 11/23/20   Bary Castilla, NP  Isopropyl Alcohol (ALCOHOL WIPES) 70 % MISC Apply 1 each topically 2 (two) times daily. 06/25/19   Minette Brine, FNP  isosorbide mononitrate (IMDUR) 30 MG 24 hr tablet TAKE 1 TABLET EVERY DAY 01/01/22   Leonie Man, MD  lamoTRIgine (LAMICTAL) 150 MG tablet Take 1 tablet (150 mg total) by mouth 2 (two) times daily. 04/20/22   Cameron Sprang, MD  levETIRAcetam (KEPPRA) 500 MG tablet Take 2 tablets every night 04/20/22   Cameron Sprang, MD  ondansetron (ZOFRAN) 4 MG tablet Take 1 tablet (4 mg total) by mouth every 8 (eight) hours as needed for nausea or vomiting. 12/20/21   [name], MD  OZEMPIC, 1 MG/DOSE, 4 MG/3ML SOPN INJECT 1 MG INTO THE SKIN ONCE A WEEK. 11/02/21   [name], MD  Pancrelipase, Lip-Prot-Amyl, 25000-79000 units CPEP Take 1-2 capsules by mouth See admin instructions. Take 1 capsules in the morning with breakfast, 1 capsule with lunch, and 2 capsules with supper    [provider]  potassium chloride SA (KLOR-CON M) 20 MEQ tablet Take 2 tablets (40 mEq total) by mouth 2 (two) times daily. With additional 60 on Metolazone days 66/59/93   Delora Fuel, MD  sennosides-docusate sodium (SENOKOT-S) 8.6-50 MG tablet Take 1-2 tablets by mouth at bedtime as needed for constipation.    [provider]  sertraline (ZOLOFT) 50 MG tablet TAKE 1 TABLET EVERY DAY 09/25/21   [name], MD  tiZANidine (ZANAFLEX) 2 MG tablet TAKE 1 TABLET AT BEDTIME AS NEEDED 07/16/22   [name], MD  tolnaftate (TINACTIN) 1 % cream Apply 1 application topically daily as needed (foot fungus).     [provider]  TRUE METRIX BLOOD GLUCOSE TEST test strip USE AS DIRECTED TO CHECK BLOOD SUGARS 2 TIMES PER DAY 04/25/22   [name], MD  TRUEplus Lancets 33G MISC TEST BLOOD SUGAR TWICE DAILY AS  DIRECTED 11/11/21   [name], MD      Allergies    Other, Penicillins, Sulfa antibiotics, Aspirin, and Codeine    Review of Systems   Review of Systems  Constitutional:  Positive for fatigue.  Respiratory:  Negative for shortness of breath.   Cardiovascular:  Negative for chest pain.  Gastrointestinal:  Positive for blood in stool. Negative for abdominal pain, nausea and vomiting.  Genitourinary:  Negative for dysuria.  Neurological:  Negative for weakness and light-headedness.    Physical Exam Updated Vital Signs BP 117/66   Pulse 72   Temp 98.7 F (37.1 C) (Oral)   Resp 14   Ht '5\' 1"'$  (1.549 m)   Wt 80.7 kg   SpO2 95%   BMI 33.63 kg/m  Physical Exam Vitals and nursing note reviewed.  Constitutional:      General: She is not in acute distress.  Appearance: She is well-developed.  HENT:     Head: Normocephalic and atraumatic.  Eyes:     Conjunctiva/sclera: Conjunctivae normal.  Cardiovascular:     Rate and Rhythm: Normal rate. Rhythm irregular.     Heart sounds: No murmur heard. Pulmonary:     Effort: Pulmonary effort is normal. No respiratory distress.     Breath sounds: Normal breath sounds.  Abdominal:     Palpations: Abdomen is soft.     Tenderness: There is no abdominal tenderness.  Musculoskeletal:        General: No swelling.     Cervical back: Neck supple.  Skin:    General: Skin is warm and dry.     Capillary Refill: Capillary refill takes less than 2 seconds.  Neurological:     Mental Status: She is alert.  Psychiatric:        Mood and Affect: Mood normal.     ED Results / Procedures / Treatments   Labs (all labs ordered are listed, but only abnormal results are displayed) Labs Reviewed  CBC WITH DIFFERENTIAL/PLATELET - Abnormal; Notable for the following components:      Result Value   WBC 3.2 (*)    RBC 3.26 (*)    Hemoglobin 7.4 (*)    HCT 23.7 (*)    MCV 72.7 (*)    MCH 22.7 (*)    RDW 18.7 (*)    Neutro Abs 1.5 (*)    All  other components within normal limits  COMPREHENSIVE METABOLIC PANEL - Abnormal; Notable for the following components:   Glucose, Bld 113 (*)    Calcium 8.7 (*)    Albumin 3.3 (*)    Total Bilirubin 0.2 (*)    GFR, Estimated 59 (*)    All other components within normal limits  LIPASE, BLOOD  OCCULT BLOOD X 1 CARD TO LAB, STOOL  TYPE AND SCREEN  PREPARE RBC (CROSSMATCH)    EKG None  Radiology No results found.  Procedures .Critical Care  Performed by: [name], PA-C Authorized by: [name], PA-C   Critical care provider statement:    Critical care time (minutes):  35   Critical care was necessary to treat or prevent imminent or life-threatening deterioration of the following conditions:  Circulatory failure   Critical care was time spent personally by me on the following activities:  Development of treatment plan with patient or surrogate, discussions with consultants, evaluation of patient's response to treatment, examination of patient, ordering and review of laboratory studies, ordering and review of radiographic studies, ordering and performing treatments and interventions, pulse oximetry, re-evaluation of patient's condition and review of old charts   Care discussed with: admitting provider       Medications Ordered in ED Medications  0.9 %  sodium chloride infusion (Manually program via Guardrails IV Fluids) (has no administration in time range)  pantoprozole (PROTONIX) 80 mg /NS 100 mL infusion (8 mg/hr Intravenous New Bag/Given 07/25/22 1704)  pantoprazole (PROTONIX) injection 40 mg (40 mg Intravenous Given 07/25/22 1651)    ED Course/ Medical Decision Making/ A&P                             Medical Decision Making Amount and/or Complexity of Data Reviewed Labs: ordered.   This patient presents to the ED for concern of blood in her stool, this involves an extensive number of treatment options, and is a complaint that carries with  it a high risk  of complications and morbidity.  The differential diagnosis includes upper GI bleed, lower GI bleed, others   Co morbidities that complicate the patient evaluation  Hx diverticular bleed, colonic polyps, Eliquis usage   Additional history obtained:   External records from outside source obtained and reviewed including cardiology notes documenting planned outpatient cardioversion for atrial fibrillation   Lab Tests:  I Ordered, and personally interpreted labs.  The pertinent results include:  Hgb 7.4, grossly unremarkable CMP,    Consultations Obtained:  I requested consultation with the gastroenterologist, Dr.Magod and discussed lab and imaging findings as well as pertinent plan - they recommend: clear liquids, plan to see as consult tomorrow AM I requested consultation with the medicine team for admission. Dr.Melvin with [facility] agrees to see the patient for admission   Problem List / ED Course / Critical interventions / Medication management   I ordered medication including protonix for possible acute upper GI bleed I ordered 1 unit PRBC for the patient's symptomatic anemia  Reevaluation of the patient after these medicines showed that the patient stayed the same I have reviewed the patients home medicines and have made adjustments as needed    Test / Admission - Considered:  Patient will need admission for further evaluation of the source of her GI bleed. She is currently receiving a transfusion and will need hemoglobin monitoring along with further evaluation by gastroenterology        Final Clinical Impression(s) / ED Diagnoses Final diagnoses:  Acute GI bleeding  Symptomatic anemia    Rx / DC Orders ED Discharge Orders     None         Ronny Bacon 07/25/22 1730    Drenda Freeze, MD 07/25/22 2220

## 2022-07-25 NOTE — ED Notes (Signed)
ED TO INPATIENT HANDOFF REPORT  ED Nurse Name and Phone #: [phone]  S Name/Age/Gender [name] 77 y.o. female Room/Bed: 023C/023C  Code Status   Code Status: Full Code  Home/SNF/Other Home Patient oriented to: self, place, time, and situation Is this baseline? Yes   Triage Complete: Triage complete  Chief Complaint GI bleed [K92.2]  Triage Note Pt came in via POV d/t rectal bleeding that began Friday evening. States that it was dark & bright red blood in it. Denies any pain, does states that she took Eliquis & did not take ity this morning bc of the bleeding. A/Ox4.    Allergies Allergies  Allergen Reactions   Other Anaphylaxis and Rash    Bleach   Penicillins Hives    Did it involve swelling of the face/tongue/throat, SOB, or low BP? No Did it involve sudden or severe rash/hives, skin peeling, or any reaction on the inside of your mouth or nose? Yes Did you need to seek medical attention at a hospital or doctor's office? Yes When did it last happen?      Childhood allergy  If all above answers are "NO", may proceed with cephalosporin use.     Sulfa Antibiotics Hives   Aspirin Other (See Comments)     On aspirin 81 mg - Rectal bleeding in dec 2018   Codeine     Headache and makes the patient feel "off"    Level of Care/Admitting Diagnosis ED Disposition     ED Disposition  Admit   Condition  --   [place]: [place] [100100]  Level of Care: Telemetry Medical [104]  May place patient in observation at [facility] or [place] if equivalent level of care is available:: No  Covid Evaluation: Asymptomatic - no recent exposure (last 10 days) testing not required  Diagnosis: GI bleed [244010]  Admitting Physician: Marcelyn Bruins [[id number]]  Attending Physician: Marcelyn Bruins [[id number]]          B Medical/Surgery History Past Medical History:  Diagnosis Date   Abnormal liver function     in the past.    Anemia    Arthritis    all over   Bruises easily    Cataract    right eye;immature   CHF (congestive heart failure) ([place])    Chronic back pain    stenosis   Chronic cough    Chronic kidney disease    COPD (chronic obstructive pulmonary disease) ([place])    COVID    aug/sept 2021   Demand myocardial infarction [facility]) 2012   Demand Infarction in setting of Pancreatitis --> mild Troponin elevation, NON-OBSTRUCTIVE CAD   Depression    takes Abilify daily as well as Zoloft   Diastolic heart failure 2595   Grade 1 diastolic Dysfunction by Echo    Diverticulosis    DM (diabetes mellitus) ([place])    takes Victoza daily as well as Lantus and Humalog   Empty sella ([place])    on MRI in 2009.   Glaucoma    Headache(784.0)    last migraine-4-24yr ago   History of blood transfusion    no abnormal reaction noted   History of colon polyps    benign   HTN (hypertension)    takes Benicar,Imdur,and Bystolic daily   Hyperlipidemia    takes Lipitor daily   Joint swelling    Nocturia    Ob[place] hypoventilation syndrome [place]    Obstructive sleep apnea 02/2018  Notably improved split-night study with weight loss from 270 (2017) down to 250 pounds (2019).   Pancreatitis    takes Pancrelipase daily   Parkinson's disease    takes Sinemet daily   Peripheral neuropathy    Pneumonia 2012   Seizures ([place])    takes Lamictal daily and Primidone nightly;last seizure 2wks ago   Urinary urgency    With increased frequency   Varicose veins of both lower extremities with pain    With edema.  Takes daily Lasix   Past Surgical History:  Procedure Laterality Date   ABDOMINAL HYSTERECTOMY     APPENDECTOMY     BACK SURGERY     Cardiac Event Monitor  September-October 2017   Sinus rhythm with occasional PACs and artifact. No arrhythmias besides one short run of tachycardia.   CHOLECYSTECTOMY     COLONOSCOPY N/A 08/15/2012   Procedure: COLONOSCOPY;  Surgeon: Arta Silence, MD;  Location: [facility];   Service: Endoscopy;  Laterality: N/A;   COLONOSCOPY WITH PROPOFOL Left 06/17/2017   Procedure: COLONOSCOPY WITH PROPOFOL;  Surgeon: [name], MD;  Location: [place];  Service: Gastroenterology;  Laterality: Left;   COLONOSCOPY WITH PROPOFOL N/A 04/03/2021   Procedure: COLONOSCOPY WITH PROPOFOL;  Surgeon: Clarene Essex, MD;  Location: [place];  Service: Endoscopy;  Laterality: N/A;   CORONARY CALCIUM SCORE AND CTA  04/2018   Coronary calcium score 71.9.  Very large, hyperdynamic LAD wrapping apex giving rise to PDA.  Moderate LAD-diagonal and circumflex plaque -> CT FFR suggested positive findings in distal D1, D2 and distal circumflex.  Referred for cath. ==> FALSE POSITIVE   ESOPHAGOGASTRODUODENOSCOPY     eye cysts Bilateral    LASIK     LEFT HEART CATH AND CORONARY ANGIOGRAPHY N/A 05/16/2018   Procedure: LEFT HEART CATH AND CORONARY ANGIOGRAPHY;  Surgeon: Belva Crome, MD;  Location: [facility] INVASIVE CV LAB:  Angiographically normal coronary arteries with LVEDP of 21 mmHg.  Large draping hyperdominant LAD that wraps the apex and provides distal half of the PDA.  Relatively small caliber distal Cx -->proxLPDA, non-dom RCA. No Cx or Diag lesions -- FALSE + CT FFR   LEFT HEART CATH AND CORONARY ANGIOGRAPHY  2009/March 2012   2009: (Dr. Alvie Heidelberg) Nonobstructive CAD; 2012: Minimal CAD --> false positive stress test   NM MYOVIEW LTD  01/2016; 01/2018   a)LOW RISK. No ischemia or infarction;; b) EF 55-60%. NO ST changes.  No ischemia or Infarction. NO significant RV enlargement.  LOW RISK.   POLYPECTOMY  04/03/2021   Procedure: POLYPECTOMY;  Surgeon: Clarene Essex, MD;  Location: [place];  Service: Endoscopy;;   Polysomnogram  02/2018   (Dr. Brett Fairy from neurology): Split study was not adequately done due to low AHI.  She slept reclined and had an AHI less than 10, prolonged hypoxemia and no hypercapnia. -->  She has home oxygen already prescribed, but did not meet criteria for nightly  oxygen.  (Was noted that the patient did not cooperate well with study.  Titration was discussed.   PRESSURE SENSOR/CARDIOMEMS N/A 11/09/2019   Procedure: PRESSURE SENSOR/CARDIOMEMS;  Surgeon: Larey Dresser, MD;  Location: [place] CV LAB;  Service: Cardiovascular;  Laterality: N/A;   RECTAL POLYPECTOMY     RIGHT/LEFT HEART CATH AND CORONARY ANGIOGRAPHY N/A 04/06/2019   Procedure: RIGHT/LEFT HEART CATH AND CORONARY ANGIOGRAPHY;  Surgeon: Leonie Man, MD;  Location: [facility] INVASIVE CV LAB; angiographically normal coronary arteries.  Moderately elevated AD LVEDP..  Mild pulmonary hypertension: PA  P 56/14 mmHg-mean 31 mmHg.  RAP 10 mmHg.  RV P-EDP 54/4 mmHg - 12 mmHg.  PCWP 11-15 mmHg.  CO-CI: 7.35-3.73.   TONSILLECTOMY     TRANSTHORACIC ECHOCARDIOGRAM  01/2016; 05/2017   A) Normal LV chamber size with mod LVH pattern. EF 50-55%. Severe LA dilation. Mod RA dilation. PAP elevated at 38 mmHg;; B) Mild concentric LVH.  EF 55-60% & no RWMA.  Grade 2 DD.  Diastolic flattening of the ventricular septum == ? elevated PAP.  Mild aortic valve calcification/sclerosis.  Mod LA dilation.  Mild RV dilation & Mod RA dilation   TRANSTHORACIC ECHOCARDIOGRAM  10/2017; 01/2018   a) EF 55-60%.  No RWMA,  Moderate LA dilation.  Mild LA dilation.  Peak PA pressures ~57 mmHg (moderate);;; b) EF 55-60%.  GRII DD.  Suggestion of RV volume overload with moderate RV dilation.  Severe LA and RA dilation.Marland Kitchen  PA pressure ~67%.   TRANSTHORACIC ECHOCARDIOGRAM  01/2019   Normal LV size and function.  EF 50 to 55%.  Impaired relaxation.  RV appears to have moderately reduced function.  Severely enlarged.  Cannot fully assess RV pressures.  Hypermobile interatrial septum.  Right atrium severely dilated.   TUBAL LIGATION     vaginal cyst removed     several times     A IV Location/Drains/Wounds Patient Lines/Drains/Airways Status     Active Line/Drains/Airways     Name Placement date Placement time Site Days   Peripheral IV  07/25/22 20 G Right Antecubital 07/25/22  1630  Antecubital  less than 1   Peripheral IV 07/25/22 20 G Anterior;Right Forearm 07/25/22  1631  Forearm  less than 1            Intake/Output Last 24 hours No intake or output data in the 24 hours ending 07/25/22 1817  Labs/Imaging Results for orders placed or performed during the hospital encounter of 07/25/22 (from the past 48 hour(s))  CBC with Differential     Status: Abnormal   Collection Time: 07/25/22  2:43 PM  Result Value Ref Range   WBC 3.2 (L) 4.0 - 10.5 K/uL   RBC 3.26 (L) 3.87 - 5.11 MIL/uL   Hemoglobin 7.4 (L) 12.0 - 15.0 g/dL    Comment: Reticulocyte Hemoglobin testing may be clinically indicated, consider ordering this additional test [id number]    HCT 23.7 (L) 36.0 - 46.0 %   MCV 72.7 (L) 80.0 - 100.0 fL   MCH 22.7 (L) 26.0 - 34.0 pg   MCHC 31.2 30.0 - 36.0 g/dL   RDW 18.7 (H) 11.5 - 15.5 %   Platelets 293 150 - 400 K/uL   nRBC 0.0 0.0 - 0.2 %   Neutrophils Relative % 48 %   Neutro Abs 1.5 (L) 1.7 - 7.7 K/uL   Lymphocytes Relative 37 %   Lymphs Abs 1.2 0.7 - 4.0 K/uL   Monocytes Relative 9 %   Monocytes Absolute 0.3 0.1 - 1.0 K/uL   Eosinophils Relative 5 %   Eosinophils Absolute 0.2 0.0 - 0.5 K/uL   Basophils Relative 1 %   Basophils Absolute 0.0 0.0 - 0.1 K/uL   Immature Granulocytes 0 %   Abs Immature Granulocytes 0.01 0.00 - 0.07 K/uL    Comment: Performed at [facility], [address] [place], [place] [address]  Comprehensive metabolic panel     Status: Abnormal   Collection Time: 07/25/22  2:43 PM  Result Value Ref Range   Sodium 136 135 - 145  mmol/L   Potassium 3.9 3.5 - 5.1 mmol/L   Chloride 99 98 - 111 mmol/L   CO2 30 22 - 32 mmol/L   Glucose, Bld 113 (H) 70 - 99 mg/dL    Comment: Glucose reference range applies only to samples taken after fasting for at least 8 hours.   BUN 9 8 - 23 mg/dL   Creatinine, Ser 1.00 0.44 - 1.00 mg/dL   Calcium 8.7 (L) 8.9 - 10.3 mg/dL   Total Protein  6.9 6.5 - 8.1 g/dL   Albumin 3.3 (L) 3.5 - 5.0 g/dL   AST 17 15 - 41 U/L   ALT 9 0 - 44 U/L   Alkaline Phosphatase 83 38 - 126 U/L   Total Bilirubin 0.2 (L) 0.3 - 1.2 mg/dL   GFR, Estimated 59 (L) >60 mL/min    Comment: (NOTE) Calculated using the CKD-EPI Creatinine Equation (2021)    Anion gap 7 5 - 15    Comment: Performed at [facility], [place] [address], [place], [place] [address]  Lipase, blood     Status: None   Collection Time: 07/25/22  2:43 PM  Result Value Ref Range   Lipase 33 11 - 51 U/L    Comment: Performed at [facility], [place] [address] [place], [place] [address]  Type and screen [place]     Status: None (Preliminary result)   Collection Time: 07/25/22  2:48 PM  Result Value Ref Range   ABO/RH(D) B POS    Antibody Screen NEG    Sample Expiration 07/28/2022,2359    Unit Number [id number]    Blood Component Type RED CELLS,LR    Unit division 00    Status of Unit ISSUED    Transfusion Status OK TO TRANSFUSE    Crossmatch Result      Compatible Performed at [facility], [place] [address] [place], [place] [address]   Prepare RBC (crossmatch)     Status: None   Collection Time: 07/25/22  4:18 PM  Result Value Ref Range   Order Confirmation      ORDER PROCESSED BY BLOOD BANK Performed at [facility], [place] [address], [place], [place] [address]    *Note: Due to a large number of results and/or encounters for the requested time period, some results have not been displayed. A complete set of results can be found in Results Review.   No results found.  Pending Labs Unresulted Labs (From admission, onward)     Start     Ordered   07/26/22 0500  Comprehensive metabolic panel  Tomorrow morning,   R        07/25/22 1730   07/26/22 0500  CBC  Tomorrow morning,   R        07/25/22 1730   07/25/22 1609  Occult blood card to lab, stool RN will collect  Once,   URGENT       Question:  Specimen to be collected by:  Answer:   RN will collect   07/25/22 1608            Vitals/Pain Today's Vitals   07/25/22 1712 07/25/22 1715 07/25/22 1730 07/25/22 1800  BP: 117/66 117/66 127/73 130/70  Pulse: 72 76 75 72  Resp: 14 (!) '29 17 11  '$ Temp: 98.7 F (37.1 C)     TempSrc: Oral     SpO2: 95% 95% 99% 97%  Weight:      Height:  PainSc:        Isolation Precautions No active isolations  Medications Medications  0.9 %  sodium chloride infusion (Manually program via Guardrails IV Fluids) (has no administration in time range)  pantoprozole (PROTONIX) 80 mg /NS 100 mL infusion (8 mg/hr Intravenous New Bag/Given 07/25/22 1704)  albuterol (PROVENTIL) (2.5 MG/3ML) 0.083% nebulizer solution 2.5 mg (has no administration in time range)  sodium chloride flush (NS) 0.9 % injection 3 mL (has no administration in time range)  acetaminophen (TYLENOL) tablet 650 mg (has no administration in time range)    Or  acetaminophen (TYLENOL) suppository 650 mg (has no administration in time range)  pantoprazole (PROTONIX) injection 40 mg (40 mg Intravenous Given 07/25/22 1651)    Mobility walks with device     Focused Assessments Anemia    R Recommendations: See Admitting Provider Note  Report given to:   Additional Notes: Call me if you have question, the patient will be sent to the floor at 1850.

## 2022-07-25 NOTE — ED Provider Triage Note (Signed)
Emergency Medicine Provider Triage Evaluation Note  [name] , a 77 y.o. female  was evaluated in triage.  Pt complains of BRBPR.  Symptoms began 5 days ago.  She has had numerous episodes of this daily since Friday.  Has no abdominal pain or symptoms of anemia including shortness of breath, dizziness, lightheadedness, fatigue.  She does take Eliquis.  She did take her morning dose today but called the nurse triage told her GI provider who told her not to take any more.  Reports history of similar, but has been many years.  Believes her last colonoscopy was 3 years ago  Review of Systems  Positive: As above Negative: As above  Physical Exam  BP 130/61 (BP Location: Right Arm)   Pulse 82   Temp 98.4 F (36.9 C)   Resp 16   Ht '5\' 1"'$  (1.549 m)   Wt 80.7 kg   SpO2 94%   BMI 33.63 kg/m  Gen:   Awake, no distress   Resp:  Normal effort  MSK:   Moves extremities without difficulty  Other:    Medical Decision Making  Medically screening exam initiated at 2:42 PM.  Appropriate orders placed.  [name] was informed that the remainder of the evaluation will be completed by another provider, this initial triage assessment does not replace that evaluation, and the importance of remaining in the ED until their evaluation is complete.  Abdominal pain labs, type and screen   Roylene Reason, Hershal Coria 07/25/22 1443

## 2022-07-25 NOTE — Telephone Encounter (Addendum)
Spoke with patient. Stated having dark stools over weekend and yesterday as well. Informed her to stop her apixaban and go to the Emergency room to get evaluated for her low Hemoglobin   ----- Message from Larey Dresser, MD sent at 07/24/2022  4:57 PM EST ----- Hemoglobin is low at 7.  She is on apixaban.  Is she still having signs of rectal bleeding? She needs to stop apixaban for now and would arrange transfusion 1 unit PRBCs.  Need to make sure she has appointment with GI.  Will have to cancel cardioversion until after GI workup.

## 2022-07-25 NOTE — ED Triage Notes (Signed)
Pt came in via POV d/t rectal bleeding that began Friday evening. States that it was dark & bright red blood in it. Denies any pain, does states that she took Eliquis & did not take ity this morning bc of the bleeding. A/Ox4.

## 2022-07-26 ENCOUNTER — Encounter (HOSPITAL_COMMUNITY): Payer: Self-pay | Admitting: Internal Medicine

## 2022-07-26 DIAGNOSIS — K921 Melena: Secondary | ICD-10-CM | POA: Diagnosis not present

## 2022-07-26 DIAGNOSIS — K922 Gastrointestinal hemorrhage, unspecified: Secondary | ICD-10-CM | POA: Diagnosis not present

## 2022-07-26 DIAGNOSIS — I5032 Chronic diastolic (congestive) heart failure: Secondary | ICD-10-CM | POA: Diagnosis not present

## 2022-07-26 DIAGNOSIS — D62 Acute posthemorrhagic anemia: Secondary | ICD-10-CM | POA: Diagnosis not present

## 2022-07-26 DIAGNOSIS — D500 Iron deficiency anemia secondary to blood loss (chronic): Secondary | ICD-10-CM | POA: Diagnosis not present

## 2022-07-26 LAB — HEMOGLOBIN AND HEMATOCRIT, BLOOD
HCT: 30.7 % — ABNORMAL LOW (ref 36.0–46.0)
Hemoglobin: 9.3 g/dL — ABNORMAL LOW (ref 12.0–15.0)

## 2022-07-26 LAB — COMPREHENSIVE METABOLIC PANEL
ALT: 10 U/L (ref 0–44)
AST: 14 U/L — ABNORMAL LOW (ref 15–41)
Albumin: 2.9 g/dL — ABNORMAL LOW (ref 3.5–5.0)
Alkaline Phosphatase: 74 U/L (ref 38–126)
Anion gap: 6 (ref 5–15)
BUN: 6 mg/dL — ABNORMAL LOW (ref 8–23)
CO2: 29 mmol/L (ref 22–32)
Calcium: 8.3 mg/dL — ABNORMAL LOW (ref 8.9–10.3)
Chloride: 102 mmol/L (ref 98–111)
Creatinine, Ser: 0.9 mg/dL (ref 0.44–1.00)
GFR, Estimated: >60 mL/min (ref >60)
Glucose, Bld: 70 mg/dL (ref 70–99)
Potassium: 3.2 mmol/L — ABNORMAL LOW (ref 3.5–5.1)
Sodium: 137 mmol/L (ref 135–145)
Total Bilirubin: 0.6 mg/dL (ref 0.3–1.2)
Total Protein: 6.3 g/dL — ABNORMAL LOW (ref 6.5–8.1)

## 2022-07-26 LAB — CBC
HCT: 25 % — ABNORMAL LOW (ref 36.0–46.0)
Hemoglobin: 7.8 g/dL — ABNORMAL LOW (ref 12.0–15.0)
MCH: 22.6 pg — ABNORMAL LOW (ref 26.0–34.0)
MCHC: 31.2 g/dL (ref 30.0–36.0)
MCV: 72.5 fL — ABNORMAL LOW (ref 80.0–100.0)
Platelets: 268 10*3/uL (ref 150–400)
RBC: 3.45 MIL/uL — ABNORMAL LOW (ref 3.87–5.11)
RDW: 19.3 % — ABNORMAL HIGH (ref 11.5–15.5)
WBC: 3.1 10*3/uL — ABNORMAL LOW (ref 4.0–10.5)
nRBC: 0 % (ref 0.0–0.2)

## 2022-07-26 LAB — TYPE AND SCREEN
ABO/RH(D): B POS
Antibody Screen: NEGATIVE
Unit division: 0

## 2022-07-26 LAB — GLUCOSE, CAPILLARY
Glucose-Capillary: 70 mg/dL (ref 70–99)
Glucose-Capillary: 61 mg/dL — ABNORMAL LOW (ref 70–99)
Glucose-Capillary: 90 mg/dL (ref 70–99)
Glucose-Capillary: 129 mg/dL — ABNORMAL HIGH (ref 70–99)
Glucose-Capillary: 102 mg/dL — ABNORMAL HIGH (ref 70–99)

## 2022-07-26 LAB — OCCULT BLOOD X 1 CARD TO LAB, STOOL: Fecal Occult Bld: POSITIVE — AB

## 2022-07-26 LAB — BPAM RBC
Blood Product Expiration Date: 202402272359
ISSUE DATE / TIME: 202401311648
Unit Type and Rh: 7300

## 2022-07-26 MED ORDER — PANTOPRAZOLE SODIUM 40 MG PO TBEC
40.0000 mg | DELAYED_RELEASE_TABLET | Freq: Every day | ORAL | Status: DC
  Administered 2022-07-26 – 2022-07-27 (×2): 40 mg via ORAL
  Filled 2022-07-26 (×2): qty 1

## 2022-07-26 NOTE — Plan of Care (Signed)
  Problem: Education: Goal: Ability to describe self-care measures that may prevent or decrease complications (Diabetes Survival Skills Education) will improve Outcome: Progressing   Problem: Education: Goal: Knowledge of General Education information will improve Description: Including pain rating scale, medication(s)/side effects and non-pharmacologic comfort measures Outcome: Progressing

## 2022-07-26 NOTE — Care Management Obs Status (Signed)
[facility] NOTIFICATION   Patient Details  Name: [name] MRN: [id number] Date of Birth: 20-May-1946   Medicare Observation Status Notification Given:  Yes    Marilu Favre, RN 07/26/2022, 12:17 PM

## 2022-07-26 NOTE — Consult Note (Signed)
Reason for Consult: Lower GI bleeding Referring Physician: Hospital team  [name] is an 77 y.o. female.  HPI: Patient seen and examined and known to me from a previous: Colonoscopy although my partner Dr. Paulita Fujita is her primary gastroenterologist and her hospital computer chart and our office computer chart was reviewed and her bleeding started with bright red blood and clots on Friday and she would have 1 or 2 episodes a day until yesterday and she called her cardiologist who told her not to take any more of her Eliquis and she is not on any extra aspirin or nonsteroidals and she has not had a bowel movement since she has been here although yesterday's seem to be darker and she has not had any abdominal pain or any other GI symptoms and we discussed almost certainly diverticular bleeding and she is tolerating clear liquids and she has no other complaints and we discussed her sugar as well  Past Medical History:  Diagnosis Date   Abnormal liver function     in the past.   Anemia    Arthritis    all over   Bruises easily    Cataract    right eye;immature   CHF (congestive heart failure) ([place])    Chronic back pain    stenosis   Chronic cough    Chronic kidney disease    COPD (chronic obstructive pulmonary disease) ([place])    COVID    aug/sept 2021   Demand myocardial infarction [facility]) 2012   Demand Infarction in setting of Pancreatitis --> mild Troponin elevation, NON-OBSTRUCTIVE CAD   Depression    takes Abilify daily as well as Zoloft   Diastolic heart failure 1245   Grade 1 diastolic Dysfunction by Echo    Diverticulosis    DM (diabetes mellitus) ([place])    takes Victoza daily as well as Lantus and Humalog   Empty sella ([place])    on MRI in 2009.   Glaucoma    Headache(784.0)    last migraine-4-71yr ago   History of blood transfusion    no abnormal reaction noted   History of colon polyps    benign   HTN (hypertension)    takes Benicar,Imdur,and Bystolic daily    Hyperlipidemia    takes Lipitor daily   Joint swelling    Nocturia    Obesity hypoventilation syndrome ([place]    Obstructive sleep apnea 02/2018   Notably improved split-night study with weight loss from 270 (2017) down to 250 pounds (2019).   Pancreatitis    takes Pancrelipase daily   Parkinson's disease    takes Sinemet daily   Peripheral neuropathy    Pneumonia 2012   Seizures ([place]    takes Lamictal daily and Primidone nightly;last seizure 2wks ago   Urinary urgency    With increased frequency   Varicose veins of both lower extremities with pain    With edema.  Takes daily Lasix    Past Surgical History:  Procedure Laterality Date   ABDOMINAL HYSTERECTOMY     APPENDECTOMY     BACK SURGERY     Cardiac Event Monitor  September-October 2017   Sinus rhythm with occasional PACs and artifact. No arrhythmias besides one short run of tachycardia.   CHOLECYSTECTOMY     COLONOSCOPY N/A 08/15/2012   Procedure: COLONOSCOPY;  Surgeon: WArta Silence MD;  [facility];  Service: Endoscopy;  Laterality: N/A;   COLONOSCOPY WITH PROPOFOL Left 06/17/2017   Procedure: COLONOSCOPY WITH PROPOFOL;  Surgeon: KTherisa Doyne  Megan Salon, MD;  Location: [place];  Service: Gastroenterology;  Laterality: Left;   COLONOSCOPY WITH PROPOFOL N/A 04/03/2021   Procedure: COLONOSCOPY WITH PROPOFOL;  Surgeon: Clarene Essex, MD;  Location: [place];  Service: Endoscopy;  Laterality: N/A;   CORONARY CALCIUM SCORE AND CTA  04/2018   Coronary calcium score 71.9.  Very large, hyperdynamic LAD wrapping apex giving rise to PDA.  Moderate LAD-diagonal and circumflex plaque -> CT FFR suggested positive findings in distal D1, D2 and distal circumflex.  Referred for cath. ==> FALSE POSITIVE   ESOPHAGOGASTRODUODENOSCOPY     eye cysts Bilateral    LASIK     LEFT HEART CATH AND CORONARY ANGIOGRAPHY N/A 05/16/2018   Procedure: LEFT HEART CATH AND CORONARY ANGIOGRAPHY;  Surgeon: Belva Crome, MD;  [facility] INVASIVE  CV LAB:  Angiographically normal coronary arteries with LVEDP of 21 mmHg.  Large draping hyperdominant LAD that wraps the apex and provides distal half of the PDA.  Relatively small caliber distal Cx -->proxLPDA, non-dom RCA. No Cx or Diag lesions -- FALSE + CT FFR   LEFT HEART CATH AND CORONARY ANGIOGRAPHY  2009/March 2012   2009: (Dr. Alvie Heidelberg) Nonobstructive CAD; 2012: Minimal CAD --> false positive stress test   NM MYOVIEW LTD  01/2016; 01/2018   a)LOW RISK. No ischemia or infarction;; b) EF 55-60%. NO ST changes.  No ischemia or Infarction. NO significant RV enlargement.  LOW RISK.   POLYPECTOMY  04/03/2021   Procedure: POLYPECTOMY;  Surgeon: Clarene Essex, MD;  Location: [place];  Service: Endoscopy;;   Polysomnogram  02/2018   (Dr. Brett Fairy from neurology): Split study was not adequately done due to low AHI.  She slept reclined and had an AHI less than 10, prolonged hypoxemia and no hypercapnia. -->  She has home oxygen already prescribed, but did not meet criteria for nightly oxygen.  (Was noted that the patient did not cooperate well with study.  Titration was discussed.   PRESSURE SENSOR/CARDIOMEMS N/A 11/09/2019   Procedure: PRESSURE SENSOR/CARDIOMEMS;  Surgeon: Larey Dresser, MD;  Location: [place] CV LAB;  Service: Cardiovascular;  Laterality: N/A;   RECTAL POLYPECTOMY     RIGHT/LEFT HEART CATH AND CORONARY ANGIOGRAPHY N/A 04/06/2019   Procedure: RIGHT/LEFT HEART CATH AND CORONARY ANGIOGRAPHY;  Surgeon: Leonie Man, MD;  [facility]: [facility] INVASIVE CV LAB; angiographically normal coronary arteries.  Moderately elevated AD LVEDP..  Mild pulmonary hypertension: PA P 56/14 mmHg-mean 31 mmHg.  RAP 10 mmHg.  RV P-EDP 54/4 mmHg - 12 mmHg.  PCWP 11-15 mmHg.  CO-CI: 7.35-3.73.   TONSILLECTOMY     TRANSTHORACIC ECHOCARDIOGRAM  01/2016; 05/2017   A) Normal LV chamber size with mod LVH pattern. EF 50-55%. Severe LA dilation. Mod RA dilation. PAP elevated at 38 mmHg;; B) Mild concentric LVH.   EF 55-60% & no RWMA.  Grade 2 DD.  Diastolic flattening of the ventricular septum == ? elevated PAP.  Mild aortic valve calcification/sclerosis.  Mod LA dilation.  Mild RV dilation & Mod RA dilation   TRANSTHORACIC ECHOCARDIOGRAM  10/2017; 01/2018   a) EF 55-60%.  No RWMA,  Moderate LA dilation.  Mild LA dilation.  Peak PA pressures ~57 mmHg (moderate);;; b) EF 55-60%.  GRII DD.  Suggestion of RV volume overload with moderate RV dilation.  Severe LA and RA dilation.Marland Kitchen  PA pressure ~67%.   TRANSTHORACIC ECHOCARDIOGRAM  01/2019   Normal LV size and function.  EF 50 to 55%.  Impaired relaxation.  RV appears to have  moderately reduced function.  Severely enlarged.  Cannot fully assess RV pressures.  Hypermobile interatrial septum.  Right atrium severely dilated.   TUBAL LIGATION     vaginal cyst removed     several times    Family History  Problem Relation Age of Onset   Allergies Father    Heart disease Father        before 61   Heart failure Father    Hypertension Father    Hyperlipidemia Father    Heart disease Mother    Diabetes Mother    Hypertension Mother    Hyperlipidemia Mother    Hypertension Sister    Heart disease Sister        before 39   Hyperlipidemia Sister    Diabetes Brother    Hypertension Brother    Diabetes Sister    Hypertension Sister    Diabetes Son    Hypertension Son    Cancer Other     Social History:  reports that she has quit smoking. Her smoking use included cigarettes. She has a 12.50 pack-year smoking history. She has never used smokeless tobacco. She reports that she does not drink alcohol and does not use drugs.  Allergies:  Allergies  Allergen Reactions   Other Anaphylaxis and Rash    Bleach   Penicillins Hives    Did it involve swelling of the face/tongue/throat, SOB, or low BP? No Did it involve sudden or severe rash/hives, skin peeling, or any reaction on the inside of your mouth or nose? Yes Did you need to seek medical attention at a  hospital or doctor's office? Yes When did it last happen?      Childhood allergy  If all above answers are "NO", may proceed with cephalosporin use.     Sulfa Antibiotics Hives   Aspirin Other (See Comments)     On aspirin 81 mg - Rectal bleeding in dec 2018   Codeine     Headache and makes the patient feel "off"    Medications: I have reviewed the patient's current medications.  Results for orders placed or performed during the hospital encounter of 07/25/22 (from the past 48 hour(s))  CBC with Differential     Status: Abnormal   Collection Time: 07/25/22  2:43 PM  Result Value Ref Range   WBC 3.2 (L) 4.0 - 10.5 K/uL   RBC 3.26 (L) 3.87 - 5.11 MIL/uL   Hemoglobin 7.4 (L) 12.0 - 15.0 g/dL    Comment: Reticulocyte Hemoglobin testing may be clinically indicated, consider ordering this additional test [id number]    HCT 23.7 (L) 36.0 - 46.0 %   MCV 72.7 (L) 80.0 - 100.0 fL   MCH 22.7 (L) 26.0 - 34.0 pg   MCHC 31.2 30.0 - 36.0 g/dL   RDW 18.7 (H) 11.5 - 15.5 %   Platelets 293 150 - 400 K/uL   nRBC 0.0 0.0 - 0.2 %   Neutrophils Relative % 48 %   Neutro Abs 1.5 (L) 1.7 - 7.7 K/uL   Lymphocytes Relative 37 %   Lymphs Abs 1.2 0.7 - 4.0 K/uL   Monocytes Relative 9 %   Monocytes Absolute 0.3 0.1 - 1.0 K/uL   Eosinophils Relative 5 %   Eosinophils Absolute 0.2 0.0 - 0.5 K/uL   Basophils Relative 1 %   Basophils Absolute 0.0 0.0 - 0.1 K/uL   Immature Granulocytes 0 %   Abs Immature Granulocytes 0.01 0.00 - 0.07 K/uL    Comment: Performed at  [facility], [place] [address] [place], [place] [address]  Comprehensive metabolic panel     Status: Abnormal   Collection Time: 07/25/22  2:43 PM  Result Value Ref Range   Sodium 136 135 - 145 mmol/L   Potassium 3.9 3.5 - 5.1 mmol/L   Chloride 99 98 - 111 mmol/L   CO2 30 22 - 32 mmol/L   Glucose, Bld 113 (H) 70 - 99 mg/dL    Comment: Glucose reference range applies only to samples taken after fasting for at least 8 hours.   BUN 9 8 -  23 mg/dL   Creatinine, Ser 1.00 0.44 - 1.00 mg/dL   Calcium 8.7 (L) 8.9 - 10.3 mg/dL   Total Protein 6.9 6.5 - 8.1 g/dL   Albumin 3.3 (L) 3.5 - 5.0 g/dL   AST 17 15 - 41 U/L   ALT 9 0 - 44 U/L   Alkaline Phosphatase 83 38 - 126 U/L   Total Bilirubin 0.2 (L) 0.3 - 1.2 mg/dL   GFR, Estimated 59 (L) >60 mL/min    Comment: (NOTE) Calculated using the CKD-EPI Creatinine Equation (2021)    Anion gap 7 5 - 15    Comment: Performed at [facility], [place] [address], [place], [place] [address]  Lipase, blood     Status: None   Collection Time: 07/25/22  2:43 PM  Result Value Ref Range   Lipase 33 11 - 51 U/L    Comment: Performed at [facility], [place] [address] [place], [place] [address]  Type and screen [place]     Status: None (Preliminary result)   Collection Time: 07/25/22  2:48 PM  Result Value Ref Range   ABO/RH(D) B POS    Antibody Screen NEG    Sample Expiration 07/28/2022,2359    Unit Number [id number]    Blood Component Type RED CELLS,LR    Unit division 00    Status of Unit ISSUED    Transfusion Status OK TO TRANSFUSE    Crossmatch Result      Compatible Performed at [facility], [place] [address], [place], [place] [address]   Prepare RBC (crossmatch)     Status: None   Collection Time: 07/25/22  4:18 PM  Result Value Ref Range   Order Confirmation      ORDER PROCESSED BY BLOOD BANK Performed at [facility], [place] [address] [place], [place] [address]   Glucose, capillary     Status: Abnormal   Collection Time: 07/25/22  8:36 PM  Result Value Ref Range   Glucose-Capillary 110 (H) 70 - 99 mg/dL    Comment: Glucose reference range applies only to samples taken after fasting for at least 8 hours.   Comment 1 Notify RN   Comprehensive metabolic panel     Status: Abnormal   Collection Time: 07/26/22  6:59 AM  Result Value Ref Range   Sodium 137 135 - 145 mmol/L   Potassium 3.2 (L) 3.5 - 5.1 mmol/L   Chloride 102 98 - 111  mmol/L   CO2 29 22 - 32 mmol/L   Glucose, Bld 70 70 - 99 mg/dL    Comment: Glucose reference range applies only to samples taken after fasting for at least 8 hours.   BUN 6 (L) 8 - 23 mg/dL   Creatinine, Ser 0.90 0.44 - 1.00 mg/dL   Calcium 8.3 (L) 8.9 - 10.3 mg/dL   Total Protein 6.3 (L)  6.5 - 8.1 g/dL   Albumin 2.9 (L) 3.5 - 5.0 g/dL   AST 14 (L) 15 - 41 U/L   ALT 10 0 - 44 U/L   Alkaline Phosphatase 74 38 - 126 U/L   Total Bilirubin 0.6 0.3 - 1.2 mg/dL   GFR, Estimated >60 >60 mL/min    Comment: (NOTE) Calculated using the CKD-EPI Creatinine Equation (2021)    Anion gap 6 5 - 15    Comment: Performed at [place] [address] [place], [place] [address]  CBC     Status: Abnormal   Collection Time: 07/26/22  6:59 AM  Result Value Ref Range   WBC 3.1 (L) 4.0 - 10.5 K/uL   RBC 3.45 (L) 3.87 - 5.11 MIL/uL   Hemoglobin 7.8 (L) 12.0 - 15.0 g/dL    Comment: Reticulocyte Hemoglobin testing may be clinically indicated, consider ordering this additional test [id number]    HCT 25.0 (L) 36.0 - 46.0 %   MCV 72.5 (L) 80.0 - 100.0 fL   MCH 22.6 (L) 26.0 - 34.0 pg   MCHC 31.2 30.0 - 36.0 g/dL   RDW 19.3 (H) 11.5 - 15.5 %   Platelets 268 150 - 400 K/uL   nRBC 0.0 0.0 - 0.2 %    Comment: Performed at [facility], [place] [address] [place], [place] [address]  Glucose, capillary     Status: None   Collection Time: 07/26/22  8:20 AM  Result Value Ref Range   Glucose-Capillary 70 70 - 99 mg/dL    Comment: Glucose reference range applies only to samples taken after fasting for at least 8 hours.  Glucose, capillary     Status: Abnormal   Collection Time: 07/26/22 10:11 AM  Result Value Ref Range   Glucose-Capillary 61 (L) 70 - 99 mg/dL    Comment: Glucose reference range applies only to samples taken after fasting for at least 8 hours.   *Note: Due to a large number of results and/or encounters for the requested time period, some results have not been displayed. A complete  set of results can be found in Results Review.    No results found.  ROS negative except above Blood pressure 130/76, pulse 66, temperature 99 F (37.2 C), temperature source Oral, resp. rate 17, height '5\' 1"'$  (1.549 m), weight 80.7 kg, SpO2 98 %. Physical Exam vital signs stable afebrile no acute distress abdomen is soft nontender BUN and creatinine normal iron studies not too bad last month hemoglobin improved with transfusion  Assessment/Plan: Almost certainly diverticular bleeding Plan: If no signs of further bleeding by the evening meal okay with me to have soft solids tonight hopefully if no bleeding can go home soon and probably would restart her Eliquis in 1 week but okay with me if cardiology feels and needs to be done sooner but would hold it for at least a few more days and please call me if signs of increased bleeding or any other GI question or problem with this hospital stay  [facility] E 07/26/2022, 10:32 AM

## 2022-07-26 NOTE — TOC Initial Note (Signed)
Transition of Care (TOC) - Initial/Assessment Note   Patient from home with sister and brother in law.   Confirmed face sheet information.   PCP [name]    Patient has transportation home at discharge    Patient Details  Name: [name] MRN: [id number] Date of Birth: September 07, 1945  Transition of Care [facility]) CM/SW Contact:    Marilu Favre, RN Phone Number: 07/26/2022, 12:20 PM  Clinical Narrative:                   Expected Discharge Plan: Home/Self Care Barriers to Discharge: Continued Medical Work up   Patient Goals and CMS Choice Patient states their goals for this hospitalization and ongoing recovery are:: to return to home   Choice offered to / list presented to : NA      Expected Discharge Plan and Services   Discharge Planning Services: NA   Living arrangements for the past 2 months: Single Family Home                           HH Arranged: NA          Prior Living Arrangements/Services Living arrangements for the past 2 months: Single Family Home Lives with:: Siblings Patient language and need for interpreter reviewed:: Yes Do you feel safe going back to the place where you live?: Yes      Need for Family Participation in Patient Care: Yes (Comment) Care giver support system in place?: Yes (comment)   Criminal Activity/Legal Involvement Pertinent to Current Situation/Hospitalization: No - Comment as needed  Activities of Daily Living      Permission Sought/Granted   Permission granted to share information with : No              Emotional Assessment Appearance:: Appears stated age Attitude/Demeanor/Rapport: Engaged Affect (typically observed): Accepting Orientation: : Oriented to Self, Oriented to Place, Oriented to  Time, Oriented to Situation Alcohol / Substance Use: Not Applicable Psych Involvement: No (comment)  Admission diagnosis:  GI bleed [K92.2] Acute GI bleeding [K92.2] Symptomatic anemia [D64.9] Patient  Active Problem List   Diagnosis Date Noted   GI bleed 07/25/2022   Left ear impacted cerumen 04/23/2022   Chronic cough 04/23/2022   Chronic right-sided low back pain with right-sided sciatica 04/23/2022   Altered mental status    Acute on chronic blood loss anemia 04/01/2021   Hypokalemia 04/01/2021   SOB (shortness of breath) 11/14/2020   Postinflammatory pulmonary fibrosis ([place]) 06/08/2020   Lung nodule 06/08/2020   Chronic kidney disease, stage 2 (mild) 01/05/2020   Hypertensive chronic kidney disease with stage 1 through stage 4 chronic kidney disease, or unspecified chronic kidney disease 01/05/2020   Snoring 01/05/2020   Neuroleptic-induced parkinsonism ([place]) 09/28/2019   Pulmonary hypertension, unspecified ([place]) 04/06/2019   Syncope 02/24/2019   Rheumatoid arthritis ([place]) 01/21/2019   Radial styloid tenosynovitis 11/24/2018   Chest pain 05/15/2018   Excessive daytime sleepiness 01/30/2018   Sleep-related hypoventilation 01/30/2018   Recurrent depression ([place]) 11/06/2017   OSA on CPAP 10/07/2017   Todd's paralysis (HCC)    Chronic diastolic CHF (congestive heart failure), NYHA class 2 (HCC)    Chronic respiratory failure with hypoxia (HCC)    Acute lower GI bleeding    Stroke-like symptoms 06/15/2017   Rectal bleeding 06/15/2017   Dehydration 03/07/2017   Medication management 12/24/2016   Near syncope 03/09/2016   Racing heart beat 03/09/2016   Dyspnea 01/28/2016  Spinal stenosis at L4-L5 level 10/04/2014   Parkinsonism 06/24/2013   Lower abdominal pain 10/31/2012   DM2 (diabetes mellitus, type 2) ([place]) 10/31/2012   Seizure disorder ([place]) 10/31/2012   Diverticulosis 10/31/2012   Anemia associated with acute blood loss 08/14/2012   Hematochezia 08/14/2012   Varicose veins of bilateral lower extremities with other complications 96/22/2979   Leg pain 03/21/2012   Radicular pain of left lower extremity 03/21/2012   Heart murmur 11/21/2011   Infected pseudocyst of  pancreas 10/28/2011   Hypertensive heart disease with chronic diastolic congestive heart failure ([place]) 10/28/2011   Dyslipidemia, goal LDL below 100 10/28/2011   Morbid obesity with BMI of 45.0-49.9, adult ([place]) 10/28/2011   Pseudoseizure 10/24/2011   Gastroesophageal reflux 10/23/2011   History of tobacco abuse 10/23/2011   Hypoxemia 10/23/2011   Disturbance in sleep behavior 09/03/2011   Partial epilepsy with impairment of consciousness ([place]) 09/03/2011   Tremor 09/03/2011   Peripheral edema 04/14/2011   Obesity hypoventilation syndrome ([place]) 10/26/2010   Atherosclerotic heart disease of native coronary artery with other forms of angina pectoris ([place]) 08/28/2010   Acute, but ill-defined, cerebrovascular disease 05/04/2008   Arthropathy 05/04/2008   Disequilibrium 05/04/2008   Generalized tonic clonic epilepsy ([place]) 05/04/2008   Increased frequency of urination 05/04/2008   Insomnia, unspecified 05/04/2008   Major depressive disorder, single episode, unspecified 05/04/2008   Memory loss 05/04/2008   Nausea 05/04/2008   Other malaise and fatigue 05/04/2008   Swelling of limb 05/04/2008   PCP:  [name], MD Pharmacy:   [facility] - [place], [place] [address] Phone: [phone] Fax: [phone]  [place], [place] - [place] [address] Phone: [phone] Fax: [phone]     Social Determinants of Health (SDOH) Social History: SDOH Screenings   Food Insecurity: No Food Insecurity (07/18/2022)  Housing: Low Risk  (03/11/2019)  Transportation Needs: No Transportation Needs (07/18/2022)  Depression (PHQ2-9): Low Risk  (07/18/2022)  Financial Resource Strain: Low Risk  (07/18/2022)  Physical Activity: Inactive (07/18/2022)  Stress: No Stress Concern Present (07/18/2022)  Tobacco Use: Medium Risk (07/26/2022)   SDOH  Interventions:     Readmission Risk Interventions     No data to display

## 2022-07-26 NOTE — Progress Notes (Signed)
   07/26/22 0000  Vital Signs  Temp  (refused all vitals until 6AM)

## 2022-07-26 NOTE — Progress Notes (Signed)
Progress Note    [name]   IRS:[id number]  DOB: 12-Jul-1945  DOA: 07/25/2022     0 PCP: [name], MD  Initial CC: rectal bleeding  Hospital Course: [name] is a 77 year old female with PMH reticulosis, colonic polyps, CHF, CKD, COPD, depression, DM II, glaucoma, HTN, HLD, peripheral neuropathy, Parkinson's who presented with having bright red blood per rectum.  She endorsed having multiple episodes intermittently over approximately 1 week prior to admission.  She stopped taking Eliquis prior to admission as well due to the ongoing bleeding.  She endorses similar history of prior rectal bleeding in the past and was told it was due to her diverticulosis at that time.  She denies any pain associated with her bleeding and was not straining on the commode prior as well.  Denies any alcohol use or tobacco use.  No significant NSAID use.  Hemoglobin was found to be 7 g/dL on workup and she was transfused 1 unit PRBC.  GI was also consulted for evaluation after admission.  Interval History:  Resting in bed when seen this morning.  Did not have any abdominal pain nor any further rectal bleeding since admission.  Assessment and Plan:  GI bleed - Presenting with dark red and bright red blood per rectum with loose stools -Given painless bleeding and description of episode, suspicion is for diverticular bleed -If recurrent brisk bleeding, will obtain bleeding scan - Hemoglobin 7 g/dL on admission and underwent 1 unit PRBC - Continue trending hemoglobin -Appreciate GI evaluation, also suspecting diverticular bleed and no invasive intervention recommended at this time - continue CLD and as per GI if no further bleeding will advance diet for tonight   Seizure disorder - History of Todd's paralysis and neuroleptic induced parkinsonism CKD 2 - Creatinine stable   Diastolic CHF - Last echo in 2022 with EF 60-65%, G1 DD, normal RV function -Hold Lasix, Coreg, Imdur in setting of  rectal bleeding and severe anemia   Hypertension - BP stable - hold home carvedilol, Lasix, imdur   Diabetes - hypoglycemia with reduced PO status - hold basal insulin for now   Hyperlipidemia - Continue home statin   OSA OHS - Continue home CPAP   Obesity - Noted   Old records reviewed in assessment of this patient  Antimicrobials:   DVT prophylaxis:  SCDs Start: 07/25/22 1728   Code Status:   Code Status: Full Code  Mobility Assessment (last 72 hours)     Mobility Assessment     Row Name 07/26/22 1032 07/26/22 0800 07/25/22 1946       Does patient have an order for bedrest or is patient medically unstable No - Continue assessment No - Continue assessment No - Continue assessment     What is the highest level of mobility based on the progressive mobility assessment? Level 5 (Walks with assist in room/hall) - Balance while stepping forward/back and can walk in room with assist - Complete -- --              Barriers to discharge:  Disposition Plan:  Home tomorrow Status is: Obs  Objective: Blood pressure 130/76, pulse 66, temperature 99 F (37.2 C), temperature source Oral, resp. rate 17, height '5\' 1"'$  (1.549 m), weight 80.7 kg, SpO2 98 %.  Examination:  Physical Exam Constitutional:      Appearance: Normal appearance.  HENT:     Head: Normocephalic and atraumatic.     Mouth/Throat:     Mouth: Mucous membranes are  moist.  Eyes:     Extraocular Movements: Extraocular movements intact.  Cardiovascular:     Rate and Rhythm: Normal rate and regular rhythm.  Pulmonary:     Effort: Pulmonary effort is normal. No respiratory distress.     Breath sounds: Normal breath sounds.  Abdominal:     General: Bowel sounds are normal. There is no distension.     Tenderness: There is no abdominal tenderness.  Musculoskeletal:        General: Normal range of motion.     Cervical back: Normal range of motion and neck supple.  Skin:    General: Skin is warm and  dry.  Neurological:     General: No focal deficit present.     Mental Status: She is alert.  Psychiatric:        Mood and Affect: Mood normal.        Behavior: Behavior normal.      Consultants:  GI  Procedures:    Data Reviewed: Results for orders placed or performed during the hospital encounter of 07/25/22 (from the past 24 hour(s))  Prepare RBC (crossmatch)     Status: None   Collection Time: 07/25/22  4:18 PM  Result Value Ref Range   Order Confirmation      ORDER PROCESSED BY BLOOD BANK Performed at [facility], [place] [address], [place], [place] [address]   Glucose, capillary     Status: Abnormal   Collection Time: 07/25/22  8:36 PM  Result Value Ref Range   Glucose-Capillary 110 (H) 70 - 99 mg/dL   Comment 1 Notify RN   Comprehensive metabolic panel     Status: Abnormal   Collection Time: 07/26/22  6:59 AM  Result Value Ref Range   Sodium 137 135 - 145 mmol/L   Potassium 3.2 (L) 3.5 - 5.1 mmol/L   Chloride 102 98 - 111 mmol/L   CO2 29 22 - 32 mmol/L   Glucose, Bld 70 70 - 99 mg/dL   BUN 6 (L) 8 - 23 mg/dL   Creatinine, Ser 0.90 0.44 - 1.00 mg/dL   Calcium 8.3 (L) 8.9 - 10.3 mg/dL   Total Protein 6.3 (L) 6.5 - 8.1 g/dL   Albumin 2.9 (L) 3.5 - 5.0 g/dL   AST 14 (L) 15 - 41 U/L   ALT 10 0 - 44 U/L   Alkaline Phosphatase 74 38 - 126 U/L   Total Bilirubin 0.6 0.3 - 1.2 mg/dL   GFR, Estimated >60 >60 mL/min   Anion gap 6 5 - 15  CBC     Status: Abnormal   Collection Time: 07/26/22  6:59 AM  Result Value Ref Range   WBC 3.1 (L) 4.0 - 10.5 K/uL   RBC 3.45 (L) 3.87 - 5.11 MIL/uL   Hemoglobin 7.8 (L) 12.0 - 15.0 g/dL   HCT 25.0 (L) 36.0 - 46.0 %   MCV 72.5 (L) 80.0 - 100.0 fL   MCH 22.6 (L) 26.0 - 34.0 pg   MCHC 31.2 30.0 - 36.0 g/dL   RDW 19.3 (H) 11.5 - 15.5 %   Platelets 268 150 - 400 K/uL   nRBC 0.0 0.0 - 0.2 %  Glucose, capillary     Status: None   Collection Time: 07/26/22  8:20 AM  Result Value Ref Range   Glucose-Capillary 70 70 - 99 mg/dL   Glucose, capillary     Status: Abnormal   Collection Time: 07/26/22 10:11 AM  Result Value Ref Range  Glucose-Capillary 61 (L) 70 - 99 mg/dL  Glucose, capillary     Status: None   Collection Time: 07/26/22 12:20 PM  Result Value Ref Range   Glucose-Capillary 90 70 - 99 mg/dL  Occult blood card to lab, stool RN will collect     Status: Abnormal   Collection Time: 07/26/22  1:00 PM  Result Value Ref Range   Fecal Occult Bld POSITIVE (A) NEGATIVE   *Note: Due to a large number of results and/or encounters for the requested time period, some results have not been displayed. A complete set of results can be found in Results Review.    I have reviewed pertinent nursing notes, vitals, labs, and images as necessary. I have ordered labwork to follow up on as indicated.  I have reviewed the last notes from staff over past 24 hours. I have discussed patient's care plan and test results with nursing staff, CM/SW, and other staff as appropriate.  Time spent: Greater than 50% of the 55 minute visit was spent in counseling/coordination of care for the patient as laid out in the A&P.   LOS: 0 days   Dwyane Dee, MD [facility] 07/26/2022, 3:22 PM

## 2022-07-26 NOTE — Inpatient Diabetes Management (Signed)
Inpatient Diabetes Program Recommendations  AACE/ADA: New Consensus Statement on Inpatient Glycemic Control (2015)  Target Ranges:  Prepandial:   less than 140 mg/dL      Peak postprandial:   less than 180 mg/dL (1-2 hours)      Critically ill patients:  140 - 180 mg/dL   Lab Results  Component Value Date   GLUCAP 61 (L) 07/26/2022   HGBA1C 7.4 (H) 06/14/2022    Latest Reference Range & Units 07/25/22 20:36 07/26/22 08:20 07/26/22 10:11  Glucose-Capillary 70 - 99 mg/dL 110 (H) 70 61 (L)  (H): Data is abnormally high (L): Data is abnormally low  Diabetes history: DM2 Outpatient Diabetes medications: Toujeo 20 units qd, Humalog 2-4 units tid meal coverage, Ozempic 1 mg qweek Current orders for Inpatient glycemic control: Semglee 10 units, Novolog 0-9 units tid correction  Inpatient Diabetes Program Recommendations:   Fasting CBG 70. Please consider: -Decrease Semglee to 8 units qd  Thank you, Nani Gasser. Katherin Ramey, RN, MSN, CDE  Diabetes Coordinator Inpatient Glycemic Control Team Team Pager [phone] (8am-5pm) 07/26/2022 11:27 AM

## 2022-07-26 NOTE — Progress Notes (Signed)
Pt refused CPAP use at this time. RN placed pt on supplemental O2 of 2LNC. Pt is tolerating well at this time. Will continue to monitor.

## 2022-07-26 NOTE — Hospital Course (Signed)
[name] is a 77 year old female with PMH reticulosis, colonic polyps, CHF, CKD, COPD, depression, DM II, glaucoma, HTN, HLD, peripheral neuropathy, Parkinson's who presented with having bright red blood per rectum.  She endorsed having multiple episodes intermittently over approximately 1 week prior to admission.  She stopped taking Eliquis prior to admission as well due to the ongoing bleeding.  She endorses similar history of prior rectal bleeding in the past and was told it was due to her diverticulosis at that time.  She denies any pain associated with her bleeding and was not straining on the commode prior as well.  Denies any alcohol use or tobacco use.  No significant NSAID use.  Hemoglobin was found to be 7 g/dL on workup and she was transfused 1 unit PRBC.  GI was also consulted for evaluation after admission.

## 2022-07-27 DIAGNOSIS — D500 Iron deficiency anemia secondary to blood loss (chronic): Secondary | ICD-10-CM | POA: Diagnosis not present

## 2022-07-27 DIAGNOSIS — I5032 Chronic diastolic (congestive) heart failure: Secondary | ICD-10-CM | POA: Diagnosis not present

## 2022-07-27 DIAGNOSIS — K921 Melena: Secondary | ICD-10-CM | POA: Diagnosis not present

## 2022-07-27 DIAGNOSIS — D62 Acute posthemorrhagic anemia: Secondary | ICD-10-CM | POA: Diagnosis not present

## 2022-07-27 DIAGNOSIS — K922 Gastrointestinal hemorrhage, unspecified: Secondary | ICD-10-CM | POA: Diagnosis not present

## 2022-07-27 LAB — GLUCOSE, CAPILLARY: Glucose-Capillary: 68 mg/dL — ABNORMAL LOW (ref 70–99)

## 2022-07-27 MED ORDER — APIXABAN 5 MG PO TABS: 5.0000 mg | ORAL_TABLET | Freq: Two times a day (BID) | ORAL | Status: DC

## 2022-07-27 MED ORDER — APIXABAN 5 MG PO TABS
5.0000 mg | ORAL_TABLET | Freq: Two times a day (BID) | ORAL | Status: DC
  Administered 2022-07-27: 5 mg via ORAL
  Filled 2022-07-27: qty 1

## 2022-07-27 NOTE — Progress Notes (Signed)
Secure chatted Dr. Sabino Gasser to let him know that patient's blood sugar was 68.

## 2022-07-27 NOTE — Plan of Care (Signed)
  Problem: Coping: Goal: Ability to adjust to condition or change in health will improve Outcome: Progressing   

## 2022-07-27 NOTE — Progress Notes (Signed)
  Chronic Care Management   Note  07/27/2022 Name: TIARNA KOPPEN MRN: [id number] DOB: [birth year]  SHIRLY BARTOSIEWICZ is a 77 y.o. year old female who is a primary care patient of [name], MD. I reached out to [name] by phone today in response to a referral sent by Ms. Charlynn Grimes Hehr's PCP.  The second contact attempt was unsuccessful.   Follow up plan: Additional outreach attempts will be made.  Noreene Larsson, [place], [place] [address] Direct Dial: [phone] [email or web address]

## 2022-07-27 NOTE — Progress Notes (Signed)
[name] 10:17 AM  Subjective: Patient doing well without signs of bleeding no GI complaints tolerating clear liquids unfortunately her diet was not advanced yesterday and no new complaints  Objective: Vital signs stable afebrile no acute distress abdomen is soft nontender hemoglobin increased nicely with transfusion  Assessment: [name] resolved diverticular bleeding  Plan: Plan patient can go home soon restart Eliquis in 1 week and less cardiology requires it sooner and might need low-dose iron at home cautioned about constipation and black stools please call me sooner if I can be of any further assistance with this hospital stay otherwise either follow-up in a few weeks with [place] or primary care  [facility]  office [phone] After 5PM or if no answer call [phone]

## 2022-07-27 NOTE — Discharge Instructions (Signed)
I discussed with Dr. [name]. You may resume your Eliquis immediately.

## 2022-07-27 NOTE — Discharge Summary (Signed)
Physician Discharge Summary   [name] JOA:[id number] DOB: April 28, 1946 DOA: 07/25/2022  PCP: [name] Chard, MD  Admit date: 07/25/2022 Discharge date: 07/27/2022  Barriers to discharge: none  Admitted From: Home Disposition:  Home Discharging physician: [name] Dee, MD  Recommendations for Outpatient Follow-up:  Follow up with cardiology as planned  Home Health:  Equipment/Devices:   Discharge Condition: stable CODE STATUS: Full Diet recommendation:  Diet Orders (From admission, onward)     Start     Ordered   07/27/22 1007  DIET SOFT Room service appropriate? Yes; Fluid consistency: Thin  Diet effective now       Question Answer Comment  Room service appropriate? Yes   Fluid consistency: Thin      07/27/22 1006   07/27/22 0000  Diet - low sodium heart healthy        07/27/22 6010            Hospital Course: [name] is a 77 year old female with PMH reticulosis, colonic polyps, CHF, CKD, COPD, depression, DM II, glaucoma, HTN, HLD, peripheral neuropathy, Parkinson's who presented with having bright red blood per rectum.  She endorsed having multiple episodes intermittently over approximately 1 week prior to admission.  She stopped taking Eliquis prior to admission as well due to the ongoing bleeding.  She endorses similar history of prior rectal bleeding in the past and was told it was due to her diverticulosis at that time.  She denies any pain associated with her bleeding and was not straining on the commode prior as well.  Denies any alcohol use or tobacco use.  No significant NSAID use.  Hemoglobin was found to be 7 g/dL on workup and she was transfused 1 unit PRBC.  GI was also consulted for evaluation after admission.  Assessment and Plan:  GI bleed - Presenting with dark red and bright red blood per rectum with loose stools -Given painless bleeding and description of episode, suspicion is for diverticular bleed -If recurrent brisk bleeding, will  obtain bleeding scan - Hemoglobin 7 g/dL on admission and underwent 1 unit PRBC -Hemoglobin improved to 9.3 g/dL prior to discharge.  She had no further bleeding events and remained hemodynamically stable -Patient undergoing outpatient DCCV with cardiology soon.  Discussed case with cardiology and Eliquis resumed at time of discharge per recommendations   Seizure disorder - History of Todd's paralysis and neuroleptic induced parkinsonism CKD 2 - Creatinine stable   Diastolic CHF - Last echo in 2022 with EF 60-65%, G1 DD, normal RV function -Home regimen resumed   Hypertension - BP stable -Home regimen resumed   Diabetes - Home regimen resumed   Hyperlipidemia - Continue home statin   OSA OHS - Continue home CPAP   Obesity - Noted   The patient's chronic medical conditions were treated accordingly per the patient's home medication regimen except as noted.  On day of discharge, patient was felt deemed stable for discharge. Patient/family member advised to call PCP or come back to ER if needed.   Principal Diagnosis: GI bleed  Discharge Diagnoses: Active Hospital Problems   Diagnosis Date Noted   GI bleed 07/25/2022    Priority: 1.   Chronic kidney disease, stage 2 (mild) 01/05/2020   Neuroleptic-induced parkinsonism ([place]) 09/28/2019   OSA on CPAP 10/07/2017   Chronic diastolic CHF (congestive heart failure), NYHA class 2 (HCC)    Seizure disorder ([place]) 10/31/2012   DM2 (diabetes mellitus, type 2) ([place]) 10/31/2012   Morbid obesity with BMI of 45.0-49.9,  adult ([place]) 10/28/2011   Hypertensive heart disease with chronic diastolic congestive heart failure ([place]) 10/28/2011   Dyslipidemia, goal LDL below 100 10/28/2011   Gastroesophageal reflux 10/23/2011   Obesity hypoventilation syndrome ([place]) 10/26/2010    Resolved Hospital Problems  No resolved problems to display.     Discharge Instructions     Diet - low sodium heart healthy   Complete by: As directed     Increase activity slowly   Complete by: As directed       Allergies as of 07/27/2022       Reactions   Other Anaphylaxis, Rash   Bleach   Penicillins Hives   Did it involve swelling of the face/tongue/throat, SOB, or low BP? No Did it involve sudden or severe rash/hives, skin peeling, or any reaction on the inside of your mouth or nose? Yes Did you need to seek medical attention at a hospital or doctor's office? Yes When did it last happen?      Childhood allergy  If all above answers are "NO", may proceed with cephalosporin use.   Sulfa Antibiotics Hives   Aspirin Other (See Comments)    On aspirin 81 mg - Rectal bleeding in dec 2018   Codeine    Headache and makes the patient feel "off"        Medication List     TAKE these medications    acetaminophen 500 MG tablet Commonly known as: TYLENOL Take 500-1,000 mg by mouth every 4 (four) hours as needed for moderate pain.   albuterol 108 (90 Base) MCG/ACT inhaler Commonly known as: VENTOLIN HFA Inhale 1-2 puffs into the lungs every 6 (six) hours as needed for wheezing or shortness of breath.   Alcohol Wipes 70 % Misc Apply 1 each topically 2 (two) times daily.   apixaban 5 MG Tabs tablet Commonly known as: ELIQUIS Take 1 tablet (5 mg total) by mouth 2 (two) times daily.   atorvastatin 10 MG tablet Commonly known as: LIPITOR TAKE 1 TABLET EVERY EVENING   B-D ULTRAFINE III SHORT PEN 31G X 8 MM Misc Generic drug: Insulin Pen Needle USE DAILY WITH VICTOZA AND/OR NOVOLOG.   benzonatate 100 MG capsule Commonly known as: TESSALON Take 1 capsule (100 mg total) by mouth 3 (three) times daily as needed for cough.   carvedilol 12.5 MG tablet Commonly known as: COREG Take 1 tablet (12.5 mg total) by mouth 2 (two) times daily.   diclofenac sodium 1 % Gel Commonly known as: VOLTAREN Apply 2 g topically 4 (four) times daily as needed (pain).   DropSafe Alcohol Prep 70 % Pads USE TWO TIMES DAILY AS DIRECTED   ferrous  sulfate 324 MG Tbec Patient takes 1 tablet by mouth every other day.   furosemide 20 MG tablet Commonly known as: LASIX TAKE 4 TABLETS (80 MG) BY MOUTH IN THE MORNING AND 3 TABLETS (60 MG) EVERY EVENING. What changed: See the new instructions.   guaiFENesin-dextromethorphan 100-10 MG/5ML syrup Commonly known as: ROBITUSSIN DM Take 10 mLs by mouth every 6 (six) hours as needed for cough.   Gvoke HypoPen 2-Pack 0.5 MG/0.1ML Soaj Generic drug: Glucagon INJECT 0.5 MG INTO THE SKIN DAILY AS NEEDED.   hydrALAZINE 100 MG tablet Commonly known as: APRESOLINE Take 1 tablet (100 mg total) by mouth 3 (three) times daily.   hydrocortisone cream 1 % Apply 1 application topically daily as needed for itching.   insulin lispro 100 UNIT/ML KwikPen Commonly known as: HumaLOG KwikPen Inject 0.02-0.04 mLs (  2-4 Units total) into the skin 3 (three) times daily as needed (high blood sugar over 150).   isosorbide mononitrate 30 MG 24 hr tablet Commonly known as: IMDUR TAKE 1 TABLET EVERY DAY   lamoTRIgine 150 MG tablet Commonly known as: LaMICtal Take 1 tablet (150 mg total) by mouth 2 (two) times daily.   levETIRAcetam 500 MG tablet Commonly known as: KEPPRA Take 2 tablets every night What changed:  how much to take how to take this when to take this additional instructions   ondansetron 4 MG tablet Commonly known as: ZOFRAN Take 1 tablet (4 mg total) by mouth every 8 (eight) hours as needed for nausea or vomiting.   Ozempic (1 MG/DOSE) 4 MG/3ML Sopn Generic drug: Semaglutide (1 MG/DOSE) INJECT 1 MG INTO THE SKIN ONCE A WEEK. What changed:  how much to take how to take this when to take this additional instructions   Pancrelipase (Lip-Prot-Amyl) 25000-79000 units Cpep Take 1-2 capsules by mouth See admin instructions. Take 2 capsules in the morning with breakfast, 1 capsule with lunch, and 2 capsules with supper   potassium chloride SA 20 MEQ tablet Commonly known as: KLOR-CON  M Take 2 tablets (40 mEq total) by mouth 2 (two) times daily. With additional 60 on Metolazone days   sennosides-docusate sodium 8.6-50 MG tablet Commonly known as: SENOKOT-S Take 1-2 tablets by mouth at bedtime as needed for constipation.   sertraline 50 MG tablet Commonly known as: ZOLOFT TAKE 1 TABLET EVERY DAY What changed: when to take this   tiZANidine 2 MG tablet Commonly known as: ZANAFLEX TAKE 1 TABLET AT BEDTIME AS NEEDED What changed:  how much to take how to take this when to take this reasons to take this additional instructions   tolnaftate 1 % cream Commonly known as: TINACTIN Apply 1 application topically daily as needed (foot fungus).   Toujeo SoloStar 300 UNIT/ML Solostar Pen Generic drug: insulin glargine (1 Unit Dial) INJECT 36 UNITS INTO THE SKIN AT BEDTIME. What changed: See the new instructions.   True Metrix Blood Glucose Test test strip Generic drug: glucose blood USE AS DIRECTED TO CHECK BLOOD SUGARS 2 TIMES PER DAY   True Metrix Meter w/Device Kit USE AS DIRECTED   TRUEplus Lancets 33G Misc TEST BLOOD SUGAR TWICE DAILY AS DIRECTED   vitamin C 1000 MG tablet Take 1,000 mg by mouth daily.        Allergies  Allergen Reactions   Other Anaphylaxis and Rash    Bleach   Penicillins Hives    Did it involve swelling of the face/tongue/throat, SOB, or low BP? No Did it involve sudden or severe rash/hives, skin peeling, or any reaction on the inside of your mouth or nose? Yes Did you need to seek medical attention at a hospital or doctor's office? Yes When did it last happen?      Childhood allergy  If all above answers are "NO", may proceed with cephalosporin use.     Sulfa Antibiotics Hives   Aspirin Other (See Comments)     On aspirin 81 mg - Rectal bleeding in dec 2018   Codeine     Headache and makes the patient feel "off"    Consultations: GI  Procedures:   Discharge Exam: BP 121/72   Pulse 66   Temp 98.3 F (36.8 C)  (Oral)   Resp 17   Ht '5\' 1"'$  (1.549 m)   Wt 80.7 kg   SpO2 97%   BMI 33.63 kg/m  Physical Exam Constitutional:      Appearance: Normal appearance.  HENT:     Head: Normocephalic and atraumatic.     Mouth/Throat:     Mouth: Mucous membranes are moist.  Eyes:     Extraocular Movements: Extraocular movements intact.  Cardiovascular:     Rate and Rhythm: Normal rate and regular rhythm.  Pulmonary:     Effort: Pulmonary effort is normal. No respiratory distress.     Breath sounds: Normal breath sounds.  Abdominal:     General: Bowel sounds are normal. There is no distension.     Tenderness: There is no abdominal tenderness.  Musculoskeletal:        General: Normal range of motion.     Cervical back: Normal range of motion and neck supple.  Skin:    General: Skin is warm and dry.  Neurological:     General: No focal deficit present.     Mental Status: She is alert.  Psychiatric:        Mood and Affect: Mood normal.        Behavior: Behavior normal.      The results of significant diagnostics from this hospitalization (including imaging, microbiology, ancillary and laboratory) are listed below for reference.   Microbiology: No results found for this or any previous visit (from the past 240 hour(s)).   Labs: BNP (last 3 results) Recent Labs    11/01/21 1233 07/06/22 0317  BNP 56.1 563.8*   Basic Metabolic Panel: Recent Labs  Lab 07/25/22 1443 07/26/22 0659  NA 136 137  K 3.9 3.2*  CL 99 102  CO2 30 29  GLUCOSE 113* 70  BUN 9 6*  CREATININE 1.00 0.90  CALCIUM 8.7* 8.3*   Liver Function Tests: Recent Labs  Lab 07/25/22 1443 07/26/22 0659  AST 17 14*  ALT 9 10  ALKPHOS 83 74  BILITOT 0.2* 0.6  PROT 6.9 6.3*  ALBUMIN 3.3* 2.9*   Recent Labs  Lab 07/25/22 1443  LIPASE 33   No results for input(s): "AMMONIA" in the last 168 hours. CBC: Recent Labs  Lab 07/24/22 1145 07/25/22 1443 07/26/22 0659 07/26/22 1643  WBC 4.0 3.2* 3.1*  --   NEUTROABS   --  1.5*  --   --   HGB 7.0* 7.4* 7.8* 9.3*  HCT 23.3* 23.7* 25.0* 30.7*  MCV 73.7* 72.7* 72.5*  --   PLT 254 293 268  --    Cardiac Enzymes: No results for input(s): "CKTOTAL", "CKMB", "CKMBINDEX", "TROPONINI" in the last 168 hours. BNP: Invalid input(s): "POCBNP" CBG: Recent Labs  Lab 07/26/22 1011 07/26/22 1220 07/26/22 1659 07/26/22 2033 07/27/22 0826  GLUCAP 61* 90 129* 102* 68*   D-Dimer No results for input(s): "DDIMER" in the last 72 hours. Hgb A1c No results for input(s): "HGBA1C" in the last 72 hours. Lipid Profile No results for input(s): "CHOL", "HDL", "LDLCALC", "TRIG", "CHOLHDL", "LDLDIRECT" in the last 72 hours. Thyroid function studies No results for input(s): "TSH", "T4TOTAL", "T3FREE", "THYROIDAB" in the last 72 hours.  Invalid input(s): "FREET3" Anemia work up No results for input(s): "VITAMINB12", "FOLATE", "FERRITIN", "TIBC", "IRON", "RETICCTPCT" in the last 72 hours. Urinalysis    Component Value Date/Time   COLORURINE STRAW (A) 07/06/2022 0142   APPEARANCEUR CLEAR 07/06/2022 0142   LABSPEC 1.006 07/06/2022 0142   PHURINE 6.0 07/06/2022 0142   GLUCOSEU NEGATIVE 07/06/2022 0142   GLUCOSEU NEGATIVE 04/17/2016 0955   HGBUR NEGATIVE 07/06/2022 0142   BILIRUBINUR NEGATIVE 07/06/2022 0142   BILIRUBINUR  Negative 12/06/2020 1701   KETONESUR NEGATIVE 07/06/2022 0142   PROTEINUR NEGATIVE 07/06/2022 0142   UROBILINOGEN 0.2 12/06/2020 1701   UROBILINOGEN 0.2 04/17/2016 0955   NITRITE NEGATIVE 07/06/2022 0142   LEUKOCYTESUR NEGATIVE 07/06/2022 0142   Sepsis Labs Recent Labs  Lab 07/24/22 1145 07/25/22 1443 07/26/22 0659  WBC 4.0 3.2* 3.1*   Microbiology No results found for this or any previous visit (from the past 240 hour(s)).  Procedures/Studies: MR BRAIN WO CONTRAST  Result Date: 07/06/2022 CLINICAL DATA:  Initial evaluation for neuro deficit, stroke suspected. EXAM: MRI HEAD WITHOUT CONTRAST TECHNIQUE: Multiplanar, multiecho pulse  sequences of the brain and surrounding structures were obtained without intravenous contrast. COMPARISON:  Prior CT from earlier the same day. FINDINGS: Brain: Cerebral volume within normal limits. Small remote cortical infarct present at the posterior right frontal region. Underlying mild chronic microvascular ischemic disease for age. No evidence for acute or subacute ischemia. Gray-white matter differentiation maintained. No acute or chronic intracranial blood products. No mass lesion, midline shift or mass effect. No hydrocephalus or extra-axial fluid collection. Empty sella noted. Vascular: Major intracranial vascular flow voids are maintained. Skull and upper cervical spine: Craniocervical junction within normal limits. Bone marrow signal intensity grossly normal. No scalp soft tissue abnormality. Sinuses/Orbits: Prior bilateral ocular lens replacement. Paranasal sinuses are largely clear. No mastoid effusion. Other: None. IMPRESSION: 1. No acute intracranial abnormality. 2. Small remote cortical infarct involving the posterior right frontal region. 3. Underlying mild chronic microvascular ischemic disease for age. 4. Empty sella. While this finding is often incidental in nature and of no clinical significance, this can also be seen in the setting of idiopathic intracranial hypertension. Electronically Signed   By: Jeannine Boga M.D.   On: 07/06/2022 00:50   DG Chest Portable 1 View  Result Date: 07/05/2022 CLINICAL DATA:  Rales and shortness of breath EXAM: PORTABLE CHEST 1 VIEW COMPARISON:  12/28/2021 FINDINGS: Stable cardiomegaly. Prominent central pulmonary arteries. Aortic atherosclerotic calcification. Bibasilar atelectasis or infiltrates. No definite pleural effusion. No pneumothorax. IMPRESSION: Bibasilar atelectasis or infiltrates. Cardiomegaly Electronically Signed   By: Placido Sou M.D.   On: 07/05/2022 23:28   CT HEAD CODE STROKE WO CONTRAST  Result Date: 07/05/2022 CLINICAL  DATA:  Code stroke. Initial evaluation for neuro deficit, stroke suspected. EXAM: CT HEAD WITHOUT CONTRAST TECHNIQUE: Contiguous axial images were obtained from the base of the skull through the vertex without intravenous contrast. RADIATION DOSE REDUCTION: This exam was performed according to the departmental dose-optimization program which includes automated exposure control, adjustment of the mA and/or kV according to patient size and/or use of iterative reconstruction technique. COMPARISON:  Comparison made with prior study from 12/28/2021. FINDINGS: Brain: Cerebral volume within normal limits. Mild chronic small vessel ischemic disease. Small remote right frontal cortical infarct. No acute intracranial hemorrhage. No acute large vessel territory infarct. No mass lesion, midline shift or mass effect. No hydrocephalus or extra-axial fluid collection. Vascular: No abnormal hyperdense vessel. Scattered vascular calcifications noted within the carotid siphons. Skull: Scalp soft tissues and calvarium within normal limits. Sinuses/Orbits: Globes orbital soft tissues within normal limits. Paranasal sinuses are clear. No mastoid effusion. Other: None. ASPECTS [facility] Stroke Program Early CT Score) - Ganglionic level infarction (caudate, lentiform nuclei, internal capsule, insula, M1-M3 cortex): 7 - Supraganglionic infarction (M4-M6 cortex): 3 Total score (0-10 with 10 being normal): 10 IMPRESSION: 1. No acute intracranial abnormality. 2. ASPECTS is 10. 3. Small remote right frontal cortical infarct with underlying mild chronic small vessel ischemic disease. These  results were communicated to Dr. Cheral Marker at 10:48 pm on 07/05/2022 by text page via the [facility] messaging system. Electronically Signed   By: Jeannine Boga M.D.   On: 07/05/2022 22:54     Time coordinating discharge: Over 30 minutes    [name] Dee, MD  [facility] 07/27/2022, 12:50 PM

## 2022-07-30 ENCOUNTER — Encounter: Payer: Self-pay | Admitting: Internal Medicine

## 2022-07-30 ENCOUNTER — Ambulatory Visit (INDEPENDENT_AMBULATORY_CARE_PROVIDER_SITE_OTHER): Payer: Medicare HMO | Admitting: Internal Medicine

## 2022-07-30 ENCOUNTER — Telehealth (HOSPITAL_COMMUNITY): Payer: Self-pay | Admitting: *Deleted

## 2022-07-30 ENCOUNTER — Telehealth: Payer: Self-pay

## 2022-07-30 VITALS — BP 106/72 | HR 71 | Temp 98.2°F | Ht 61.0 in | Wt 176.8 lb

## 2022-07-30 DIAGNOSIS — D6869 Other thrombophilia: Secondary | ICD-10-CM

## 2022-07-30 DIAGNOSIS — Z09 Encounter for follow-up examination after completed treatment for conditions other than malignant neoplasm: Secondary | ICD-10-CM | POA: Diagnosis not present

## 2022-07-30 DIAGNOSIS — I110 Hypertensive heart disease with heart failure: Secondary | ICD-10-CM

## 2022-07-30 DIAGNOSIS — I4892 Unspecified atrial flutter: Secondary | ICD-10-CM | POA: Diagnosis not present

## 2022-07-30 DIAGNOSIS — E6609 Other obesity due to excess calories: Secondary | ICD-10-CM | POA: Diagnosis not present

## 2022-07-30 DIAGNOSIS — I25118 Atherosclerotic heart disease of native coronary artery with other forms of angina pectoris: Secondary | ICD-10-CM | POA: Diagnosis not present

## 2022-07-30 DIAGNOSIS — I5032 Chronic diastolic (congestive) heart failure: Secondary | ICD-10-CM

## 2022-07-30 DIAGNOSIS — Z6833 Body mass index (BMI) 33.0-33.9, adult: Secondary | ICD-10-CM | POA: Diagnosis not present

## 2022-07-30 DIAGNOSIS — K922 Gastrointestinal hemorrhage, unspecified: Secondary | ICD-10-CM | POA: Diagnosis not present

## 2022-07-30 DIAGNOSIS — E876 Hypokalemia: Secondary | ICD-10-CM

## 2022-07-30 NOTE — Patient Instructions (Signed)

## 2022-07-30 NOTE — Telephone Encounter (Signed)
Transition Care Management Unsuccessful Follow-up Telephone Call  Date of discharge and from where:  07/27/2022 - [place]  Attempts:  1st Attempt  Reason for unsuccessful TCM follow-up call:  Left voice message

## 2022-07-30 NOTE — Telephone Encounter (Signed)
Transition Care Management Follow-up Telephone Call Date of discharge and from where: 07/25/2022 [place] How have you been since you were released from the hospital? Pt states feeling tired.  Any questions or concerns? No  Items Reviewed: Did the pt receive and understand the discharge instructions provided? Yes  Medications obtained and verified? Yes  Other? Yes  Any new allergies since your discharge? No  Dietary orders reviewed? Yes Do you have support at home? Yes   Home Care and Equipment/Supplies: Were home health services ordered? no If so, what is the name of the agency? N/a  Has the agency set up a time to come to the patient's home? no Were any new equipment or medical supplies ordered?  No What is the name of the medical supply agency? N/a Were you able to get the supplies/equipment? no Do you have any questions related to the use of the equipment or supplies? No  Functional Questionnaire: (I = Independent and D = Dependent) ADLs: i  Bathing/Dressing- i  Meal Prep- i  Eating- i  Maintaining continence- i  Transferring/Ambulation- i  Managing Meds- i  Follow up appointments reviewed:  PCP Hospital f/u appt confirmed? Yes  Scheduled to see robyn sanders  on 07/30/2022 @ [facility]. [facility] f/u appt confirmed? No  Scheduled to see n/a on n/a @ n/a. Are transportation arrangements needed? No  If their condition worsens, is the pt aware to call PCP or go to the Emergency Dept.? Yes Was the patient provided with contact information for the PCP's office or ED? Yes Was to pt encouraged to call back with questions or concerns? Yes

## 2022-07-30 NOTE — Progress Notes (Signed)
I,Victoria T Hamilton,acting as a scribe for [name], MD.,have documented all relevant documentation on the behalf of [name], MD,as directed by  [name], MD while in the presence of [name], MD.    Subjective:     Patient ID: [name] , female    DOB: September 13, 1945 , 77 y.o.   MRN: [id number]   Chief Complaint  Patient presents with   Hospitalization Follow-up    HPI  Pt presents today for hospital follow up. Admitted on 07/25/22 discharged on 07/27/22.   She was admitted on 1/31, but states her sx started the Friday prior, 1/26. She had just left a funeral and went to the bathroom. She noticed a large amount of blood in her underwear, and she could feel a lot of liquid coming out of her rectum.  There was no associated abdominal discomfort. She denies lifting heavy objects. She did have urge to defecate. She returned to [facility] on Saturday and she continued to have rectal bleeding. Her sister contacted her GI specialist,Dr. Outlaw who suggested she go to ER for further evaluation. She is not sure what could have precipitated her sx. Hospital course significant for: Hemoglobin was found to be 7 g/dL on workup and she was transfused 1 unit PRBC.  GI was also consulted for evaluation after admission.  She was discharged in stable condition on 07/27/22. She has not had any further episodes since then.   She is scheduled for her mammogram this week. DM eye exam scheduled for April.        Past Medical History:  Diagnosis Date   Abnormal liver function     in the past.   Anemia    Arthritis    all over   Bruises easily    Cataract    right eye;immature   CHF (congestive heart failure) (HCC)    Chronic back pain    stenosis   Chronic cough    Chronic kidney disease    COPD (chronic obstructive pulmonary disease) ([place])    COVID    aug/sept 2021   Demand myocardial infarction [facility]) 2012   Demand Infarction in setting of Pancreatitis --> mild  Troponin elevation, NON-OBSTRUCTIVE CAD   Depression    takes Abilify daily as well as Zoloft   Diastolic heart failure [id number]   Grade 1 diastolic Dysfunction by Echo    Diverticulosis    DM (diabetes mellitus) ([place])    takes Victoza daily as well as Lantus and Humalog   Empty sella ([place])    on MRI in 2009.   Glaucoma    Headache(784.0)    last migraine-4-79yr ago   History of blood transfusion    no abnormal reaction noted   History of colon polyps    benign   HTN (hypertension)    takes Benicar,Imdur,and Bystolic daily   Hyperlipidemia    takes Lipitor daily   Joint swelling    Nocturia    Obesity hypoventilation syndrome ([place]    Obstructive sleep apnea 02/2018   Notably improved split-night study with weight loss from 270 (2017) down to 250 pounds (2019).   Pancreatitis    takes Pancrelipase daily   Parkinson's disease    takes Sinemet daily   Peripheral neuropathy    Pneumonia 2012   Seizures ([place]    takes Lamictal daily and Primidone nightly;last seizure 2wks ago   Urinary urgency    With increased frequency   Varicose veins of both  lower extremities with pain    With edema.  Takes daily Lasix     Family History  Problem Relation Age of Onset   Allergies Father    Heart disease Father        before 93   Heart failure Father    Hypertension Father    Hyperlipidemia Father    Heart disease Mother    Diabetes Mother    Hypertension Mother    Hyperlipidemia Mother    Hypertension Sister    Heart disease Sister        before 2   Hyperlipidemia Sister    Diabetes Brother    Hypertension Brother    Diabetes Sister    Hypertension Sister    Diabetes Son    Hypertension Son    Cancer Other      Current Outpatient Medications:    acetaminophen (TYLENOL) 500 MG tablet, Take 500-1,000 mg by mouth every 4 (four) hours as needed for moderate pain., Disp: , Rfl:    albuterol (VENTOLIN HFA) 108 (90 Base) MCG/ACT inhaler, Inhale 1-2 puffs into the lungs every  6 (six) hours as needed for wheezing or shortness of breath., Disp: 18 g, Rfl: 5   Alcohol Swabs (DROPSAFE ALCOHOL PREP) 70 % PADS, USE TWO TIMES DAILY AS DIRECTED, Disp: 200 each, Rfl: 6   apixaban (ELIQUIS) 5 MG TABS tablet, Take 1 tablet (5 mg total) by mouth 2 (two) times daily., Disp: 60 tablet, Rfl:    Ascorbic Acid (VITAMIN C) 1000 MG tablet, Take 1,000 mg by mouth daily., Disp: , Rfl:    atorvastatin (LIPITOR) 10 MG tablet, TAKE 1 TABLET EVERY EVENING, Disp: 90 tablet, Rfl: 2   B-D ULTRAFINE III SHORT PEN 31G X 8 MM MISC, USE DAILY WITH VICTOZA AND/OR NOVOLOG., Disp: 400 each, Rfl: 2   benzonatate (TESSALON) 100 MG capsule, Take 1 capsule (100 mg total) by mouth 3 (three) times daily as needed for cough., Disp: 30 capsule, Rfl: 0   Blood Glucose Monitoring Suppl (TRUE METRIX METER) w/Device KIT, USE AS DIRECTED, Disp: 1 kit, Rfl: 1   diclofenac sodium (VOLTAREN) 1 % GEL, Apply 2 g topically 4 (four) times daily as needed (pain)., Disp: , Rfl:    ferrous sulfate 324 MG TBEC, Take 324 mg by mouth daily with breakfast., Disp: , Rfl:    furosemide (LASIX) 20 MG tablet, TAKE 4 TABLETS (80 MG) BY MOUTH IN THE MORNING AND 3 TABLETS (60 MG) EVERY EVENING. (Patient taking differently: Take 60-80 mg by mouth See admin instructions. Take 80 mg in the morning and 60 mg in the evening), Disp: 630 tablet, Rfl: 3   Glucagon (GVOKE HYPOPEN 2-PACK) 0.5 MG/0.1ML SOAJ, INJECT 0.5 MG INTO THE SKIN DAILY AS NEEDED., Disp: 2 mL, Rfl: 3   guaiFENesin-dextromethorphan (ROBITUSSIN DM) 100-10 MG/5ML syrup, Take 10 mLs by mouth every 6 (six) hours as needed for cough., Disp: 118 mL, Rfl: 0   hydrALAZINE (APRESOLINE) 100 MG tablet, Take 1 tablet (100 mg total) by mouth 3 (three) times daily., Disp: 270 tablet, Rfl: 3   hydrocortisone cream 1 %, Apply 1 application topically daily as needed for itching. , Disp: , Rfl:    insulin lispro (HUMALOG KWIKPEN) 100 UNIT/ML KwikPen, Inject 0.02-0.04 mLs (2-4 Units total) into the  skin 3 (three) times daily as needed (high blood sugar over 150). (Patient taking differently: Inject 0-10 Units into the skin See admin instructions. Sliding scale Inject  (high blood sugar over 150).  150-199=2 units  200-249=4 units 250-299=6 units 300-349=8 units 350 above 10 units Over 400 call physician), Disp: 15 mL, Rfl: 2   Isopropyl Alcohol (ALCOHOL WIPES) 70 % MISC, Apply 1 each topically 2 (two) times daily., Disp: 300 each, Rfl: 3   isosorbide mononitrate (IMDUR) 30 MG 24 hr tablet, TAKE 1 TABLET EVERY DAY (Patient taking differently: Take 30 mg by mouth at bedtime.), Disp: 90 tablet, Rfl: 2   lamoTRIgine (LAMICTAL) 150 MG tablet, Take 1 tablet (150 mg total) by mouth 2 (two) times daily., Disp: 180 tablet, Rfl: 3   levETIRAcetam (KEPPRA) 500 MG tablet, Take 2 tablets every night, Disp: 180 tablet, Rfl: 3   ondansetron (ZOFRAN) 4 MG tablet, Take 1 tablet (4 mg total) by mouth every 8 (eight) hours as needed for nausea or vomiting., Disp: 20 tablet, Rfl: 1   OZEMPIC, 1 MG/DOSE, 4 MG/3ML SOPN, INJECT 1 MG INTO THE SKIN ONCE A WEEK., Disp: 9 mL, Rfl: 3   Pancrelipase, Lip-Prot-Amyl, 25000-79000 units CPEP, Take 1-2 capsules by mouth See admin instructions. Take 2 capsules in the morning with breakfast, 1 capsule with lunch, and 2 capsules with supper, Disp: , Rfl:    potassium chloride SA (KLOR-CON M) 20 MEQ tablet, Take 2 tablets (40 mEq total) by mouth 2 (two) times daily. With additional 60 on Metolazone days, Disp: 120 tablet, Rfl: 0   sennosides-docusate sodium (SENOKOT-S) 8.6-50 MG tablet, Take 1-2 tablets by mouth at bedtime as needed for constipation., Disp: , Rfl:    sertraline (ZOLOFT) 50 MG tablet, TAKE 1 TABLET EVERY DAY, Disp: 90 tablet, Rfl: 2   tiZANidine (ZANAFLEX) 2 MG tablet, TAKE 1 TABLET AT BEDTIME AS NEEDED, Disp: 30 tablet, Rfl: 3   tolnaftate (TINACTIN) 1 % cream, Apply 1 application topically daily as needed (foot fungus). , Disp: , Rfl:    TRUE METRIX BLOOD GLUCOSE TEST  test strip, USE AS DIRECTED TO CHECK BLOOD SUGARS 2 TIMES PER DAY, Disp: 200 strip, Rfl: 10   TRUEplus Lancets 33G MISC, TEST BLOOD SUGAR TWICE DAILY AS DIRECTED, Disp: 200 each, Rfl: 3   carvedilol (COREG) 12.5 MG tablet, TAKE 1 TABLET TWICE DAILY, Disp: 180 tablet, Rfl: 3   dexlansoprazole (DEXILANT) 60 MG capsule, Take 60 mg by mouth daily., Disp: , Rfl:    insulin glargine, 1 Unit Dial, (TOUJEO) 300 UNIT/ML Solostar Pen, Inject 20 Units into the skin at bedtime., Disp: , Rfl:    Allergies  Allergen Reactions   Other Anaphylaxis and Rash    Bleach   Penicillins Hives    Did it involve swelling of the face/tongue/throat, SOB, or low BP? No Did it involve sudden or severe rash/hives, skin peeling, or any reaction on the inside of your mouth or nose? Yes Did you need to seek medical attention at a hospital or doctor's office? Yes When did it last happen?      Childhood allergy  If all above answers are "NO", may proceed with cephalosporin use.     Sulfa Antibiotics Hives   Aspirin Other (See Comments)     On aspirin 81 mg - Rectal bleeding in dec 2018   Codeine     Headache and makes the patient feel "off"     Review of Systems  Constitutional: Negative.   Respiratory: Negative.    Cardiovascular: Negative.   Neurological: Negative.   Psychiatric/Behavioral: Negative.       Today's Vitals   07/30/22 1201  BP: 106/72  Pulse: 71  Temp: 98.2 F (36.8  C)  SpO2: 98%  Weight: 176 lb 12.8 oz (80.2 kg)  Height: 5' 1"$  (1.549 m)   Body mass index is 33.41 kg/m.  Wt Readings from Last 3 Encounters:  08/15/22 187 lb (84.8 kg)  08/01/22 176 lb 9.6 oz (80.1 kg)  07/30/22 176 lb 12.8 oz (80.2 kg)    Objective:  Physical Exam Vitals and nursing note reviewed.  Constitutional:      Appearance: Normal appearance.  HENT:     Head: Normocephalic and atraumatic.     Nose:     Comments: Masked     Mouth/Throat:     Comments: Masked  Eyes:     Extraocular Movements: Extraocular  movements intact.  Cardiovascular:     Rate and Rhythm: Normal rate and regular rhythm.     Heart sounds: Murmur heard.  Pulmonary:     Effort: Pulmonary effort is normal.     Breath sounds: Normal breath sounds.  Skin:    General: Skin is warm.  Neurological:     General: No focal deficit present.     Mental Status: She is alert.  Psychiatric:        Mood and Affect: Mood normal.        Behavior: Behavior normal.       Assessment And Plan:     1. Acute lower GI bleeding Comments: Possibly due to diverticulosis. She is scheduled to see GI as an outpt. They were agreeable to resuming anticoagulant therapy due to underlying cardio issue. She is encouraged to notify myself and/or GI if she has recurrence of symptoms. TCM PERFORMED. A MEMBER OF THE CLINICAL TEAM SPOKE WITH THE PATIENT UPON DISCHARGE. DISCHARGE SUMMARY WAS REVIEWED IN FULL DETAIL DURING THE VISIT. MEDS RECONCILED AND COMPARED TO DISCHARGE MEDS. MEDICATION LIST WAS UPDATED AND REVIEWED WITH THE PATIENT. GREATER THAN 50% FACE TO FACE TIME WAS SPENT IN COUNSELING AND COORDINATION OF CARE. ALL QUESTIONS WERE ANSWERED TO THE SATISFACTION OF THE PATIENT.  - CBC no Diff - Iron, TIBC and Ferritin Panel  2. Hypertensive heart disease with chronic diastolic congestive heart failure ([place]) Comments: Chronic, well controlled. She will c/w carvedilol 12.64m bid, furosemide 80qam, and60 qpm, hydralazine 1012mpo tid.  She is encouraged to follow a low sodium diet.   3. Atherosclerotic heart disease of native coronary artery with other forms of angina pectoris ([place]: Chronic, she is encouraged to follow a heart healthy lifestyle. She will c/w Eliquis, Bblocker and statin therapy.  4. [facility] She is scheduled for DCCV by Cardiology. Currently, she is asxic.  5. Acquired thrombophilia ([place]: Chronic, currently on Eliquis due to atrial flutter.  6. Class 1 obesity due to  excess calories with serious comorbidity and body mass index (BMI) of 33.0 to 33.9 in adult Comments: She is encouraged to aim for at least 150 minutes of exercise/week, while striving for BMI<30 to decrease cardiac risk.  7. Hospital discharge follow-up   Patient was given opportunity to ask questions. Patient verbalized understanding of the plan and was able to repeat key elements of the plan. All questions were answered to their satisfaction.   I, RoMaximino GreenlandMD, have reviewed all documentation for this visit. The documentation on 08/19/22 for the exam, diagnosis, procedures, and orders are all accurate and complete.   IF YOU HAVE BEEN REFERRED TO A SPECIALIST, IT MAY TAKE 1-2 WEEKS TO SCHEDULE/PROCESS THE REFERRAL. IF YOU HAVE NOT HEARD FROM US/SPECIALIST IN TWO WEEKS,  PLEASE GIVE [place] A CALL AT [phone] X [phone].   THE PATIENT IS ENCOURAGED TO PRACTICE SOCIAL DISTANCING DUE TO THE COVID-19 PANDEMIC.

## 2022-07-31 LAB — CBC
Hematocrit: 27.2 % — ABNORMAL LOW (ref 34.0–46.6)
Hemoglobin: 8.1 g/dL — ABNORMAL LOW (ref 11.1–15.9)
MCH: 22.1 pg — ABNORMAL LOW (ref 26.6–33.0)
MCHC: 29.8 g/dL — ABNORMAL LOW (ref 31.5–35.7)
MCV: 74 fL — ABNORMAL LOW (ref 79–97)
Platelets: 330 10*3/uL (ref 150–450)
RBC: 3.67 x10E6/uL — ABNORMAL LOW (ref 3.77–5.28)
RDW: 17.9 % — ABNORMAL HIGH (ref 11.7–15.4)
WBC: 5.1 10*3/uL (ref 3.4–10.8)

## 2022-07-31 LAB — IRON,TIBC AND FERRITIN PANEL
Ferritin: 20 ng/mL (ref 15–150)
Iron Saturation: 7 % — CL (ref 15–55)
Iron: 26 ug/dL — ABNORMAL LOW (ref 27–139)
Total Iron Binding Capacity: 349 ug/dL (ref 250–450)
UIBC: 323 ug/dL (ref 118–369)

## 2022-08-01 ENCOUNTER — Other Ambulatory Visit (HOSPITAL_COMMUNITY): Payer: Self-pay | Admitting: *Deleted

## 2022-08-01 ENCOUNTER — Encounter (HOSPITAL_COMMUNITY): Payer: Self-pay | Admitting: Cardiology

## 2022-08-01 ENCOUNTER — Encounter (HOSPITAL_COMMUNITY): Payer: Self-pay

## 2022-08-01 ENCOUNTER — Ambulatory Visit (HOSPITAL_COMMUNITY): Admit: 2022-08-01 | Payer: Medicare HMO | Admitting: Cardiology

## 2022-08-01 ENCOUNTER — Ambulatory Visit (HOSPITAL_COMMUNITY)
Admission: RE | Admit: 2022-08-01 | Discharge: 2022-08-01 | Disposition: A | Discharge status: Home / Self Care | Payer: Medicare HMO | Source: Ambulatory Visit | Attending: Cardiology | Admitting: Cardiology

## 2022-08-01 VITALS — BP 104/60 | HR 80 | Wt 176.6 lb

## 2022-08-01 DIAGNOSIS — J8410 Pulmonary fibrosis, unspecified: Secondary | ICD-10-CM | POA: Insufficient documentation

## 2022-08-01 DIAGNOSIS — E611 Iron deficiency: Secondary | ICD-10-CM

## 2022-08-01 DIAGNOSIS — Z794 Long term (current) use of insulin: Secondary | ICD-10-CM | POA: Diagnosis not present

## 2022-08-01 DIAGNOSIS — J439 Emphysema, unspecified: Secondary | ICD-10-CM | POA: Diagnosis not present

## 2022-08-01 DIAGNOSIS — E669 Obesity, unspecified: Secondary | ICD-10-CM | POA: Diagnosis not present

## 2022-08-01 DIAGNOSIS — I484 Atypical atrial flutter: Secondary | ICD-10-CM | POA: Diagnosis not present

## 2022-08-01 DIAGNOSIS — Z7985 Long-term (current) use of injectable non-insulin antidiabetic drugs: Secondary | ICD-10-CM | POA: Insufficient documentation

## 2022-08-01 DIAGNOSIS — I110 Hypertensive heart disease with heart failure: Secondary | ICD-10-CM | POA: Insufficient documentation

## 2022-08-01 DIAGNOSIS — I5032 Chronic diastolic (congestive) heart failure: Secondary | ICD-10-CM | POA: Insufficient documentation

## 2022-08-01 DIAGNOSIS — Z79899 Other long term (current) drug therapy: Secondary | ICD-10-CM | POA: Insufficient documentation

## 2022-08-01 DIAGNOSIS — F17210 Nicotine dependence, cigarettes, uncomplicated: Secondary | ICD-10-CM | POA: Insufficient documentation

## 2022-08-01 DIAGNOSIS — D62 Acute posthemorrhagic anemia: Secondary | ICD-10-CM

## 2022-08-01 DIAGNOSIS — G4733 Obstructive sleep apnea (adult) (pediatric): Secondary | ICD-10-CM | POA: Diagnosis not present

## 2022-08-01 DIAGNOSIS — I480 Paroxysmal atrial fibrillation: Secondary | ICD-10-CM | POA: Insufficient documentation

## 2022-08-01 DIAGNOSIS — Z8616 Personal history of COVID-19: Secondary | ICD-10-CM | POA: Insufficient documentation

## 2022-08-01 DIAGNOSIS — Z7901 Long term (current) use of anticoagulants: Secondary | ICD-10-CM | POA: Insufficient documentation

## 2022-08-01 DIAGNOSIS — I272 Pulmonary hypertension, unspecified: Secondary | ICD-10-CM | POA: Diagnosis not present

## 2022-08-01 DIAGNOSIS — R053 Chronic cough: Secondary | ICD-10-CM | POA: Insufficient documentation

## 2022-08-01 DIAGNOSIS — Z6833 Body mass index (BMI) 33.0-33.9, adult: Secondary | ICD-10-CM | POA: Insufficient documentation

## 2022-08-01 LAB — BASIC METABOLIC PANEL
Anion gap: 6 (ref 5–15)
BUN: 11 mg/dL (ref 8–23)
CO2: 30 mmol/L (ref 22–32)
Calcium: 8.6 mg/dL — ABNORMAL LOW (ref 8.9–10.3)
Chloride: 103 mmol/L (ref 98–111)
Creatinine, Ser: 0.85 mg/dL (ref 0.44–1.00)
GFR, Estimated: >60 mL/min (ref >60)
Glucose, Bld: 123 mg/dL — ABNORMAL HIGH (ref 70–99)
Potassium: 3.6 mmol/L (ref 3.5–5.1)
Sodium: 139 mmol/L (ref 135–145)

## 2022-08-01 LAB — CBC
HCT: 26.5 % — ABNORMAL LOW (ref 36.0–46.0)
Hemoglobin: 8.3 g/dL — ABNORMAL LOW (ref 12.0–15.0)
MCH: 23.2 pg — ABNORMAL LOW (ref 26.0–34.0)
MCHC: 31.3 g/dL (ref 30.0–36.0)
MCV: 74 fL — ABNORMAL LOW (ref 80.0–100.0)
Platelets: 321 10*3/uL (ref 150–400)
RBC: 3.58 MIL/uL — ABNORMAL LOW (ref 3.87–5.11)
RDW: 19.1 % — ABNORMAL HIGH (ref 11.5–15.5)
WBC: 4.2 10*3/uL (ref 4.0–10.5)
nRBC: 0 % (ref 0.0–0.2)

## 2022-08-01 SURGERY — CARDIOVERSION
Anesthesia: Monitor Anesthesia Care

## 2022-08-01 NOTE — Patient Instructions (Signed)
Medication Changes:  None, continue current medications  Iron infusion needed, we will set this up and call you with date and time  Lab Work:  Labs done today, your results will be available in MyChart, we will contact you for abnormal readings.  Testing/Procedures:  Your physician has requested that you have a TEE/Cardioversion. During a TEE, sound waves are used to create images of your heart. It provides your doctor with information about the size and shape of your heart and how well your heart's chambers and valves are working. In this test, a transducer is attached to the end of a flexible tube that is guided down you throat and into your esophagus (the tube leading from your mouth to your stomach) to get a more detailed image of your heart. Once the TEE has determined that a blood clot is not present, the cardioversion begins. Electrical Cardioversion uses a jolt of electricity to your heart either through paddles or wired patches attached to your chest. This is a controlled, usually prescheduled, procedure. This procedure is done at the hospital and you are not awake during the procedure. You usually go home the day of the procedure. Please see the instruction sheet given to you today for more information. SEE INSTRUCTIONS BELOW  Referrals:  none  Special Instructions // Education:  Do the following things EVERYDAY: Weigh yourself in the morning before breakfast. Write it down and keep it in a log. Take your medicines as prescribed Eat low salt foods--Limit salt (sodium) to 2000 mg per day.  Stay as active as you can everyday Limit all fluids for the day to less than 2 liters  PROCEDURE INSTRUCTIONS: You are scheduled for a TEE (Transesophageal Echocardiogram) Guided Cardioversion on Wednesday, February 21 with Dr. [name].    Please arrive at the [facility] (Main Entrance A) at [facility]: [address] [place], [place] [address] at 10:30 AM.   DIET:  Nothing to  eat or drink after midnight except a sip of water with medications (see medication instructions below)  MEDICATION INSTRUCTIONS:   Wednesday 2/14 DO NOT TAKE OZEMPIC      Tuesday 2/20 ONLY TAKE 10 UNITS OF YOUR TOUJEO  Wednesday 2/21 DO NOT TAKE Insulin or Furosemide Continue taking your anticoagulant (blood thinner): Apixaban (Eliquis). PLEASE DO NOT MISS ANY DOSES  LABS: DONE TODAY  FYI:  For your safety, and to allow [place] to monitor your vital signs accurately during the surgery/procedure we request: If you have artificial nails, gel coating, SNS etc, please have those removed prior to your surgery/procedure. Not having the nail coverings /polish removed may result in cancellation or delay of your surgery/procedure.  You must have a responsible person to drive you home and stay in the waiting area during your procedure. Failure to do so could result in cancellation.  Bring your insurance cards.  *Special Note: Every effort is made to have your procedure done on time. Occasionally there are emergencies that occur at the hospital that may cause delays. Please be patient if a delay does occur.   Follow-Up in: AS SCHEDULED 08/24/22  At the [facility], you and your health needs are our priority. We have a designated team specialized in the treatment of Heart Failure. This Care Team includes your primary Heart Failure Specialized Cardiologist (physician), Advanced Practice Providers (APPs- Physician Assistants and Nurse Practitioners), and Pharmacist who all work together to provide you with the care you need, when you need it.   You  may see any of the following providers on your designated Care Team at your next follow up:  Dr. Glori Bickers Dr. Loralie Champagne Dr. Roxana Hires, NP Lyda Jester, [place] [facility] [place], [place] Forestine Na, NP Audry Riles, PharmD   Please be sure to bring in all your medications bottles to every appointment.    Need to Contact us:  If you have any questions or concerns before your next appointment please send [place] a message through [place] or call our office at [phone].    TO LEAVE A MESSAGE FOR THE NURSE SELECT OPTION 2, PLEASE LEAVE A MESSAGE INCLUDING: YOUR NAME DATE OF BIRTH CALL BACK NUMBER REASON FOR CALL**this is important as we prioritize the call backs  YOU WILL RECEIVE A CALL BACK THE SAME DAY AS LONG AS YOU CALL BEFORE 4:00 PM

## 2022-08-01 NOTE — Progress Notes (Signed)
PCP: Sanders, Robyn, MD Cardiology: Dr. Harding HF Cardiology: Dr. Liliann File  77 y.o. with history of OHS/OSA and RV dysfunction was referred by Dr. Harding for evaluation of CHF.  She has a history of OSA/OHS.  She uses CPAP at night but not currently using oxygen during the day.  Remote smoker.  She had an echo in 9/20 showing EF 50-55% but moderate-severe RV dysfunction with mild-moderate RV dilation.  RHC/LHC in 10/20 showed normal coronaries, pulmonary venous hypertension.  V/Q scan in 2/21 did not show evidence for acute on chronic PE.    Cardiomems was placed in 5/21.  RHC at that time showed normal filling pressures and moderate pulmonary hypertension but suspect due to high output.  No evidence for shunt lesion.  Echo showed EF 60-65%, enlarged RV with normal systolic function.   She was admitted in 5/21 with syncope, thought to be due to a seizure.    She had COVID-19 infection in 8/21. 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   Echo in 9/21 with EF 50-55%, severe RV enlargement and severely decreased RV systolic function, PASP 54 mmHg.    She was admitted in 5/22 with CHF, bronchitis.  She was diuresed and treated with antibiotics. Echo in 5/22 showed EF 60-65%, mild LVH, ?normal RV, severe LAE, ?normal PA pressure, IVC normal.  V/Q scan negative.   In 10/22, she was admitted with GI bleeding, thought to be diverticular.  BP meds were decreased and Lasix was cut back.  With increased weight, she increased her Lasix back to 80 mg daily.   She was seen in the ER in 1/24 with atypical atrial flutter, this was new.  She was started on apixaban and sent home from ER.  I saw her in followup and planned to set her up for DCCV, but she was admitted on 07/25/22 with GI bleed, thought to be diverticular.  Apixaban was held and she was tranfused. Apixaban was restarted after discharge.  She did not  have endoscopy.   She returns for followup of CHF.  She is in atypical atrial flutter today.  She is back on apixaban.  She is using CPAP nightly.  Weight down 6 lbs.  She has had no further BRBPR/melena.  She still has chronic cough.  She has dyspnea walking longer longer distances or up inclines.  She has slept propped up for years.  Rare atypical chest pain.  No lightheadedness.  She does not feel palpitations.   ECG (personally reviewed): Atypical atrial flutter rate 76, iRBBB  Cardiomems PADP 20 mmHg (goal 20).   Labs (10/20): K 4.1, creatinine 1.15, hgb 11.4 Labs (6/21): K 3.5, creatinine 1.04, BNP 81 Labs (12/21): K 3.7, creatinine 0.79 Labs (11/22): LDL 63, BNP 33 Labs (2/23): K 3.8, creatinine 0.97 Labs (9/23): K 4.1, creatinine 0.97 Labs (1/24): K 3.5, creatinine 0.92, BNP 167 Labs (2/24): transferrin saturation 7%, hgb 8.1  PMH: 1. OHS/OSA: She uses CPAP.  2. H/o CVA 3. Seizure disorder 4. Type 2 DM 5. Depression 6. HTN 7. Hyperlipidemia 8. Chronic diastolic CHF: Echo (9/20) with EF 50-55%, moderate to severe RV dilation with mild-moderately decreased RV systolic function.  - RHC/LHC (10/20): normal coronaries; mean RA 10, PA 56/14 mean 31, mean PCWP 13, LVEDP 18, CO/CI 7.35/3.73, PVR 2.44.  - RHC/Cardiomems placement (5/21): mean RA 7, PA 58/21 mean 34, PCWP mean 12, CI 8.3 F/3.95 T, PVR 2.86 WU.   No evidence for shunt lesion.  - Echo (5/21): EF 60-65%, RV enlarged with normal systolic function.  - Echo (9/21): EF 50-55%, severe RV enlargement and severely decreased RV systolic function, PASP 54 mmHg.  - Echo (5/22): EF 60-65%, mild LVH, ?normal RV, severe LAE, ?normal PA pressure, IVC normal. 9. Primarily pulmonary venous hypertension.  - V/Q scan (2/21, 5/22): No evidence for acute or chronic PE.  - PFTs normal in 5/21.  10. COVID-19 PNA 8/21.  - CT chest in 10/21 with emphysema and mild fibrosis concerning for post-infectious inflammatory fibrosis from COVID.  11. GI  bleeding: 10/22, diverticular bleed.  - Presumed diverticular bleed in 1/24.  12. Atypical atrial flutter/atrial fibrillation: First noted 1/24.   Social History   Socioeconomic History   Marital status: Divorced    Spouse name: Not on file   Number of children: Not on file   Years of education: Not on file   Highest education level: Not on file  Occupational History   Occupation: Disabled    Comment: CNA  Tobacco Use   Smoking status: Former    Packs/day: 0.50    Years: 25.00    Total pack years: 12.50    Types: Cigarettes   Smokeless tobacco: Never   Tobacco comments:    quit smoking 20+ytrs ago  Vaping Use   Vaping Use: Never used  Substance and Sexual Activity   Alcohol use: No   Drug use: No   Sexual activity: Not Currently    Birth control/protection: Surgical  Other Topics Concern   Not on file  Social History Narrative   She lives in [place]. She has lots of family in the area and is accompanied by her sister.   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   She has a distant history of smoking, quit 20 years ago.   Right handed    Social Determinants of Health   Financial Resource Strain: Low Risk  (07/18/2022)   Overall Financial Resource Strain (CARDIA)    Difficulty of Paying Living Expenses: Not hard at all  Food Insecurity: No Food Insecurity (07/18/2022)   Hunger Vital Sign    Worried About Running Out of Food in the Last Year: Never true    Ran Out of Food in the Last Year: Never true  Transportation Needs: No Transportation Needs (07/18/2022)   PRAPARE - Transportation    Lack of Transportation (Medical): No    Lack of Transportation (Non-Medical): No  Physical Activity: Inactive (07/18/2022)   Exercise Vital Sign    Days of Exercise per Week: 0 days    Minutes of Exercise per Session: 0 min  Stress: No Stress Concern Present (07/18/2022)   [employer] - Occupational Stress Questionnaire     Feeling of Stress : Not at all  Social Connections: Not on file  Intimate Partner Violence: Not At Risk (10/22/2018)   Humiliation, Afraid, Rape, and Kick questionnaire    Fear of Current or Ex-Partner: No    Emotionally Abused: No    Physically Abused: No    Sexually Abused: No   Family History  Problem Relation Age of Onset   Allergies Father    Heart disease Father        before 60   Heart failure Father    Hypertension Father    Hyperlipidemia Father    Heart disease Mother    Diabetes Mother    Hypertension Mother      Hyperlipidemia Mother    Hypertension Sister    Heart disease Sister        before 60   Hyperlipidemia Sister    Diabetes Brother    Hypertension Brother    Diabetes Sister    Hypertension Sister    Diabetes Son    Hypertension Son    Cancer Other    Current Outpatient Medications  Medication Sig Dispense Refill   acetaminophen (TYLENOL) 500 MG tablet Take 500-1,000 mg by mouth every 4 (four) hours as needed for moderate pain.     albuterol (VENTOLIN HFA) 108 (90 Base) MCG/ACT inhaler Inhale 1-2 puffs into the lungs every 6 (six) hours as needed for wheezing or shortness of breath. 18 g 5   Alcohol Swabs (DROPSAFE ALCOHOL PREP) 70 % PADS USE TWO TIMES DAILY AS DIRECTED 200 each 6   apixaban (ELIQUIS) 5 MG TABS tablet Take 1 tablet (5 mg total) by mouth 2 (two) times daily. 60 tablet    Ascorbic Acid (VITAMIN C) 1000 MG tablet Take 1,000 mg by mouth daily.     atorvastatin (LIPITOR) 10 MG tablet TAKE 1 TABLET EVERY EVENING 90 tablet 2   B-D ULTRAFINE III SHORT PEN 31G X 8 MM MISC USE DAILY WITH VICTOZA AND/OR NOVOLOG. 400 each 2   benzonatate (TESSALON) 100 MG capsule Take 1 capsule (100 mg total) by mouth 3 (three) times daily as needed for cough. 30 capsule 0   Blood Glucose Monitoring Suppl (TRUE METRIX METER) w/Device KIT USE AS DIRECTED 1 kit 1   carvedilol (COREG) 12.5 MG tablet Take 1 tablet (12.5 mg total) by mouth 2 (two) times daily. 180  tablet 3   diclofenac sodium (VOLTAREN) 1 % GEL Apply 2 g topically 4 (four) times daily as needed (pain).     ferrous sulfate 324 MG TBEC Patient takes 1 tablet by mouth every other day.     furosemide (LASIX) 20 MG tablet TAKE 4 TABLETS (80 MG) BY MOUTH IN THE MORNING AND 3 TABLETS (60 MG) EVERY EVENING. 630 tablet 3   Glucagon (GVOKE HYPOPEN 2-PACK) 0.5 MG/0.1ML SOAJ INJECT 0.5 MG INTO THE SKIN DAILY AS NEEDED. 2 mL 3   guaiFENesin-dextromethorphan (ROBITUSSIN DM) 100-10 MG/5ML syrup Take 10 mLs by mouth every 6 (six) hours as needed for cough. 118 mL 0   hydrALAZINE (APRESOLINE) 100 MG tablet Take 1 tablet (100 mg total) by mouth 3 (three) times daily. 270 tablet 3   hydrocortisone cream 1 % Apply 1 application topically daily as needed for itching.      Insulin Glargine (TOUJEO SOLOSTAR [place]) Inject 20 Units into the skin at bedtime.     insulin lispro (HUMALOG KWIKPEN) 100 UNIT/ML KwikPen Inject 0.02-0.04 mLs (2-4 Units total) into the skin 3 (three) times daily as needed (high blood sugar over 150). 15 mL 2   Isopropyl Alcohol (ALCOHOL WIPES) 70 % MISC Apply 1 each topically 2 (two) times daily. 300 each 3   isosorbide mononitrate (IMDUR) 30 MG 24 hr tablet TAKE 1 TABLET EVERY DAY 90 tablet 2   lamoTRIgine (LAMICTAL) 150 MG tablet Take 1 tablet (150 mg total) by mouth 2 (two) times daily. 180 tablet 3   levETIRAcetam (KEPPRA) 500 MG tablet Take 2 tablets every night 180 tablet 3   ondansetron (ZOFRAN) 4 MG tablet Take 1 tablet (4 mg total) by mouth every 8 (eight) hours as needed for nausea or vomiting. 20 tablet 1   OZEMPIC, 1 MG/DOSE, 4 MG/3ML SOPN INJECT 1   MG INTO THE SKIN ONCE A WEEK. 9 mL 3   Pancrelipase, Lip-Prot-Amyl, 25000-79000 units CPEP Take 1-2 capsules by mouth See admin instructions. Take 2 capsules in the morning with breakfast, 1 capsule with lunch, and 2 capsules with supper     potassium chloride SA (KLOR-CON M) 20 MEQ tablet Take 2 tablets (40 mEq total) by mouth 2 (two)  times daily. With additional 60 on Metolazone days 120 tablet 0   sennosides-docusate sodium (SENOKOT-S) 8.6-50 MG tablet Take 1-2 tablets by mouth at bedtime as needed for constipation.     sertraline (ZOLOFT) 50 MG tablet TAKE 1 TABLET EVERY DAY 90 tablet 2   tiZANidine (ZANAFLEX) 2 MG tablet TAKE 1 TABLET AT BEDTIME AS NEEDED 30 tablet 3   tolnaftate (TINACTIN) 1 % cream Apply 1 application topically daily as needed (foot fungus).      TRUE METRIX BLOOD GLUCOSE TEST test strip USE AS DIRECTED TO CHECK BLOOD SUGARS 2 TIMES PER DAY 200 strip 10   TRUEplus Lancets 33G MISC TEST BLOOD SUGAR TWICE DAILY AS DIRECTED 200 each 3   No current facility-administered medications for this encounter.   BP 104/60   Pulse 80   Wt 80.1 kg (176 lb 9.6 oz)   SpO2 98%   BMI 33.37 kg/m  General: NAD Neck: No JVD, no thyromegaly or thyroid nodule.  Lungs: Clear to auscultation bilaterally with normal respiratory effort. CV: Nondisplaced PMI.  Heart irregular S1/S2, no S3/S4, no murmur.  No peripheral edema.  No carotid bruit.  Normal pedal pulses.  Abdomen: Soft, nontender, no hepatosplenomegaly, no distention.  Skin: Intact without lesions or rashes.  Neurologic: Alert and oriented x 3.  Psych: Normal affect. Extremities: No clubbing or cyanosis.  HEENT: Normal.   Assessment/Plan: 1. Chronic diastolic CHF/RV failure: Echo in 9/20 showed EF 50-55% with moderate-severe RV dilation and mild-moderately decreased systolic function. RHC/LHC showed mild-moderate pulmonary venous hypertension and no significant coronary disease.  Cause of RV dysfunction is not clear, possibly due to diastolic CHF + OHS/OSA.  V/Q scan in 2/21 did not show acute or chronic PE.  Cardiomems was placed in 5/21, normal filling pressures with moderate pulmonary hypertension likely due to high output (no evidence for shunt lesion on RHC).  Echo in 9/21 with EF 50-55%, severe RVE with severe RV dysfunction.  Echo in 5/22 with EF 60-65%, RV  function reportedly normal with normal PA pressure.  Her dyspnea is partially related to lung disease; based on last CT, she has developed mild pulmonary fibrosis since COVID, possibly post-inflammatory.  Cardiomems remains 20 mmHg today which is at goal, she is not volume overloaded on exam today and weight has trended down.  NYHA class II-III.  - Continue Lasix 80 qam/60 qpm.  BMET today.  - She did not tolerate SGLT2-inhibitor due to recurrent yeast infections.    - She will have TEE rather than TTE (see below).  2. OHS/OSA: She is using CPAP but not oxygen during the day (no longer appears to need). CT chest in 10/21 with emphysema and mild fibrosis, possibly post-inflammatory after COVID-19 infection.   - Followup with pulmonary (Dr. Mannam).  3. Pulmonary hypertension: RHC in 10/20 showed mild-moderate pulmonary venous hypertension.  RHC in 5/21 showed moderate pulmonary hypertension with low PVR and high CO, likely PH due to high output, no evidence for shunt lesion. No role for selective pulmonary vasodilators.  4. HTN: BP controlled on hydralazine and Coreg.  5. Obesity: She is on semaglutide, weight   continues to trend down.  6. GI bleeding: Recent presumed recurrent diverticular bleed in 1/24.  Apixaban stopped transiently.  7. Atypical atrial flutter/atrial fibrillation: Atypical flutter on today's ECG, persistent.  HR controlled on Coreg.  - Continue Coreg.  - Continue apixaban, she missed several doses of this at time of recent GI bleed.  - Rather than waiting another 3 wks on apixaban without missing a dose, I will go ahead and arrange for TEE-DCCV.  We discussed risks/benefits and she agrees to procedure.  This will limit somewhat the necessary exposure to apixaban (Will aim for TEE-DCCV soon, then at minimum she will need 1 month post-DCCV of apixaban).  - Watchman would be a reasonable consideration in the future, especially if she bleeds again.   8. Fe deficiency: I will arrange for  Feraheme infusion.   Followup in 4 wks with APP.   Towanna Avery 08/01/2022 

## 2022-08-01 NOTE — H&P (View-Only) (Signed)
PCP: [name], MD Cardiology: Dr. Ellyn Hack HF Cardiology: Dr. [name]  77 y.o. with history of OHS/OSA and RV dysfunction was referred by Dr. Ellyn Hack for evaluation of CHF.  She has a history of OSA/OHS.  She uses CPAP at night but not currently using oxygen during the day.  Remote smoker.  She had an echo in 9/20 showing EF 50-55% but moderate-severe RV dysfunction with mild-moderate RV dilation.  RHC/LHC in 10/20 showed normal coronaries, pulmonary venous hypertension.  V/Q scan in 2/21 did not show evidence for acute on chronic PE.    Cardiomems was placed in 5/21.  RHC at that time showed normal filling pressures and moderate pulmonary hypertension but suspect due to high output.  No evidence for shunt lesion.  Echo showed EF 60-65%, enlarged RV with normal systolic function.   She was admitted in 5/21 with syncope, thought to be due to a seizure.    She had COVID-19 infection in 8/21. 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   Echo in 9/21 with EF 50-55%, severe RV enlargement and severely decreased RV systolic function, PASP 54 mmHg.    She was admitted in 5/22 with CHF, bronchitis.  She was diuresed and treated with antibiotics. Echo in 5/22 showed EF 60-65%, mild LVH, ?normal RV, severe LAE, ?normal PA pressure, IVC normal.  V/Q scan negative.   In 10/22, she was admitted with GI bleeding, thought to be diverticular.  BP meds were decreased and Lasix was cut back.  With increased weight, she increased her Lasix back to 80 mg daily.   She was seen in the ER in 1/24 with atypical atrial flutter, this was new.  She was started on apixaban and sent home from ER.  I saw her in followup and planned to set her up for DCCV, but she was admitted on 07/25/22 with GI bleed, thought to be diverticular.  Apixaban was held and she was tranfused. Apixaban was restarted after discharge.  She did not  have endoscopy.   She returns for followup of CHF.  She is in atypical atrial flutter today.  She is back on apixaban.  She is using CPAP nightly.  Weight down 6 lbs.  She has had no further BRBPR/melena.  She still has chronic cough.  She has dyspnea walking longer longer distances or up inclines.  She has slept propped up for years.  Rare atypical chest pain.  No lightheadedness.  She does not feel palpitations.   ECG (personally reviewed): Atypical atrial flutter rate 76, iRBBB  Cardiomems PADP 20 mmHg (goal 20).   Labs (10/20): K 4.1, creatinine 1.15, hgb 11.4 Labs (6/21): K 3.5, creatinine 1.04, BNP 81 Labs (12/21): K 3.7, creatinine 0.79 Labs (11/22): LDL 63, BNP 33 Labs (2/23): K 3.8, creatinine 0.97 Labs (9/23): K 4.1, creatinine 0.97 Labs (1/24): K 3.5, creatinine 0.92, BNP 167 Labs (2/24): transferrin saturation 7%, hgb 8.1  PMH: 1. OHS/OSA: She uses CPAP.  2. H/o CVA 3. Seizure disorder 4. Type 2 DM 5. Depression 6. HTN 7. Hyperlipidemia 8. Chronic diastolic CHF: Echo (123456) with EF 50-55%, moderate to severe RV dilation with mild-moderately decreased RV systolic function.  - RHC/LHC (10/20): normal coronaries; mean RA 10, PA 56/14 mean 31, mean PCWP 13, LVEDP 18, CO/CI 7.35/3.73, PVR 2.44.  - RHC/Cardiomems placement (5/21): mean RA 7, PA 58/21 mean 34, PCWP mean 12, CI 8.3 F/3.95 T, PVR 2.86 WU.  No evidence for shunt lesion.  - Echo (5/21): EF 60-65%, RV enlarged with normal systolic function.  - Echo (9/21): EF 50-55%, severe RV enlargement and severely decreased RV systolic function, PASP 54 mmHg.  - Echo (5/22): EF 60-65%, mild LVH, ?normal RV, severe LAE, ?normal PA pressure, IVC normal. 9. Primarily pulmonary venous hypertension.  - V/Q scan (2/21, 5/22): No evidence for acute or chronic PE.  - PFTs normal in 5/21.  10. COVID-19 PNA 8/21.  - CT chest in 10/21 with emphysema and mild fibrosis concerning for post-infectious inflammatory fibrosis from COVID.  11. GI  bleeding: 10/22, diverticular bleed.  - Presumed diverticular bleed in 1/24.  12. Atypical atrial flutter/atrial fibrillation: First noted 1/24.   Social History   Socioeconomic History   Marital status: Divorced    Spouse name: Not on file   Number of children: Not on file   Years of education: Not on file   Highest education level: Not on file  Occupational History   Occupation: Disabled    Comment: CNA  Tobacco Use   Smoking status: Former    Packs/day: 0.50    Years: 25.00    Total pack years: 12.50    Types: Cigarettes   Smokeless tobacco: Never   Tobacco comments:    quit smoking 20+ytrs ago  Vaping Use   Vaping Use: Never used  Substance and Sexual Activity   Alcohol use: No   Drug use: No   Sexual activity: Not Currently    Birth control/protection: Surgical  Other Topics Concern   Not on file  Social History Narrative   She lives in [place]. She has lots of family in the area and is accompanied by her sister.   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   She has a distant history of smoking, quit 20 years ago.   Right handed    Social Determinants of Health   Financial Resource Strain: Low Risk  (07/18/2022)   Overall Financial Resource Strain (CARDIA)    Difficulty of Paying Living Expenses: Not hard at all  Food Insecurity: No Food Insecurity (07/18/2022)   Hunger Vital Sign    Worried About Running Out of Food in the Last Year: Never true    Ran Out of Food in the Last Year: Never true  Transportation Needs: No Transportation Needs (07/18/2022)   PRAPARE - Hydrologist (Medical): No    Lack of Transportation (Non-Medical): No  Physical Activity: Inactive (07/18/2022)   Exercise Vital Sign    Days of Exercise per Week: 0 days    Minutes of Exercise per Session: 0 min  Stress: No Stress Concern Present (07/18/2022)   [place]     Feeling of Stress : Not at all  Social Connections: Not on file  Intimate Partner Violence: Not At Risk (10/22/2018)   Humiliation, Afraid, Rape, and Kick questionnaire    Fear of Current or Ex-Partner: No    Emotionally Abused: No    Physically Abused: No    Sexually Abused: No   Family History  Problem Relation Age of Onset   Allergies Father    Heart disease Father        before 87   Heart failure Father    Hypertension Father    Hyperlipidemia Father    Heart disease Mother    Diabetes Mother    Hypertension Mother  Hyperlipidemia Mother    Hypertension Sister    Heart disease Sister        before 70   Hyperlipidemia Sister    Diabetes Brother    Hypertension Brother    Diabetes Sister    Hypertension Sister    Diabetes Son    Hypertension Son    Cancer Other    Current Outpatient Medications  Medication Sig Dispense Refill   acetaminophen (TYLENOL) 500 MG tablet Take 500-1,000 mg by mouth every 4 (four) hours as needed for moderate pain.     albuterol (VENTOLIN HFA) 108 (90 Base) MCG/ACT inhaler Inhale 1-2 puffs into the lungs every 6 (six) hours as needed for wheezing or shortness of breath. 18 g 5   Alcohol Swabs (DROPSAFE ALCOHOL PREP) 70 % PADS USE TWO TIMES DAILY AS DIRECTED 200 each 6   apixaban (ELIQUIS) 5 MG TABS tablet Take 1 tablet (5 mg total) by mouth 2 (two) times daily. 60 tablet    Ascorbic Acid (VITAMIN C) 1000 MG tablet Take 1,000 mg by mouth daily.     atorvastatin (LIPITOR) 10 MG tablet TAKE 1 TABLET EVERY EVENING 90 tablet 2   B-D ULTRAFINE III SHORT PEN 31G X 8 MM MISC USE DAILY WITH VICTOZA AND/OR NOVOLOG. 400 each 2   benzonatate (TESSALON) 100 MG capsule Take 1 capsule (100 mg total) by mouth 3 (three) times daily as needed for cough. 30 capsule 0   Blood Glucose Monitoring Suppl (TRUE METRIX METER) w/Device KIT USE AS DIRECTED 1 kit 1   carvedilol (COREG) 12.5 MG tablet Take 1 tablet (12.5 mg total) by mouth 2 (two) times daily. 180  tablet 3   diclofenac sodium (VOLTAREN) 1 % GEL Apply 2 g topically 4 (four) times daily as needed (pain).     ferrous sulfate 324 MG TBEC Patient takes 1 tablet by mouth every other day.     furosemide (LASIX) 20 MG tablet TAKE 4 TABLETS (80 MG) BY MOUTH IN THE MORNING AND 3 TABLETS (60 MG) EVERY EVENING. 630 tablet 3   Glucagon (GVOKE HYPOPEN 2-PACK) 0.5 MG/0.1ML SOAJ INJECT 0.5 MG INTO THE SKIN DAILY AS NEEDED. 2 mL 3   guaiFENesin-dextromethorphan (ROBITUSSIN DM) 100-10 MG/5ML syrup Take 10 mLs by mouth every 6 (six) hours as needed for cough. 118 mL 0   hydrALAZINE (APRESOLINE) 100 MG tablet Take 1 tablet (100 mg total) by mouth 3 (three) times daily. 270 tablet 3   hydrocortisone cream 1 % Apply 1 application topically daily as needed for itching.      Insulin Glargine (TOUJEO SOLOSTAR [place]) Inject 20 Units into the skin at bedtime.     insulin lispro (HUMALOG KWIKPEN) 100 UNIT/ML KwikPen Inject 0.02-0.04 mLs (2-4 Units total) into the skin 3 (three) times daily as needed (high blood sugar over 150). 15 mL 2   Isopropyl Alcohol (ALCOHOL WIPES) 70 % MISC Apply 1 each topically 2 (two) times daily. 300 each 3   isosorbide mononitrate (IMDUR) 30 MG 24 hr tablet TAKE 1 TABLET EVERY DAY 90 tablet 2   lamoTRIgine (LAMICTAL) 150 MG tablet Take 1 tablet (150 mg total) by mouth 2 (two) times daily. 180 tablet 3   levETIRAcetam (KEPPRA) 500 MG tablet Take 2 tablets every night 180 tablet 3   ondansetron (ZOFRAN) 4 MG tablet Take 1 tablet (4 mg total) by mouth every 8 (eight) hours as needed for nausea or vomiting. 20 tablet 1   OZEMPIC, 1 MG/DOSE, 4 MG/3ML SOPN INJECT 1  MG INTO THE SKIN ONCE A WEEK. 9 mL 3   Pancrelipase, Lip-Prot-Amyl, 25000-79000 units CPEP Take 1-2 capsules by mouth See admin instructions. Take 2 capsules in the morning with breakfast, 1 capsule with lunch, and 2 capsules with supper     potassium chloride SA (KLOR-CON M) 20 MEQ tablet Take 2 tablets (40 mEq total) by mouth 2 (two)  times daily. With additional 60 on Metolazone days 120 tablet 0   sennosides-docusate sodium (SENOKOT-S) 8.6-50 MG tablet Take 1-2 tablets by mouth at bedtime as needed for constipation.     sertraline (ZOLOFT) 50 MG tablet TAKE 1 TABLET EVERY DAY 90 tablet 2   tiZANidine (ZANAFLEX) 2 MG tablet TAKE 1 TABLET AT BEDTIME AS NEEDED 30 tablet 3   tolnaftate (TINACTIN) 1 % cream Apply 1 application topically daily as needed (foot fungus).      TRUE METRIX BLOOD GLUCOSE TEST test strip USE AS DIRECTED TO CHECK BLOOD SUGARS 2 TIMES PER DAY 200 strip 10   TRUEplus Lancets 33G MISC TEST BLOOD SUGAR TWICE DAILY AS DIRECTED 200 each 3   No current facility-administered medications for this encounter.   BP 104/60   Pulse 80   Wt 80.1 kg (176 lb 9.6 oz)   SpO2 98%   BMI 33.37 kg/m  General: NAD Neck: No JVD, no thyromegaly or thyroid nodule.  Lungs: Clear to auscultation bilaterally with normal respiratory effort. CV: Nondisplaced PMI.  Heart irregular S1/S2, no S3/S4, no murmur.  No peripheral edema.  No carotid bruit.  Normal pedal pulses.  Abdomen: Soft, nontender, no hepatosplenomegaly, no distention.  Skin: Intact without lesions or rashes.  Neurologic: Alert and oriented x 3.  Psych: Normal affect. Extremities: No clubbing or cyanosis.  HEENT: Normal.   Assessment/Plan: 1. Chronic diastolic CHF/RV failure: Echo in 9/20 showed EF 50-55% with moderate-severe RV dilation and mild-moderately decreased systolic function. RHC/LHC showed mild-moderate pulmonary venous hypertension and no significant coronary disease.  Cause of RV dysfunction is not clear, possibly due to diastolic CHF + OHS/OSA.  V/Q scan in 2/21 did not show acute or chronic PE.  Cardiomems was placed in 5/21, normal filling pressures with moderate pulmonary hypertension likely due to high output (no evidence for shunt lesion on RHC).  Echo in 9/21 with EF 50-55%, severe RVE with severe RV dysfunction.  Echo in 5/22 with EF 60-65%, RV  function reportedly normal with normal PA pressure.  Her dyspnea is partially related to lung disease; based on last CT, she has developed mild pulmonary fibrosis since COVID, possibly post-inflammatory.  Cardiomems remains 20 mmHg today which is at goal, she is not volume overloaded on exam today and weight has trended down.  NYHA class II-III.  - Continue Lasix 80 qam/60 qpm.  BMET today.  - She did not tolerate SGLT2-inhibitor due to recurrent yeast infections.    - She will have TEE rather than TTE (see below).  2. OHS/OSA: She is using CPAP but not oxygen during the day (no longer appears to need). CT chest in 10/21 with emphysema and mild fibrosis, possibly post-inflammatory after COVID-19 infection.   - Followup with pulmonary (Dr. Vaughan Browner).  3. Pulmonary hypertension: RHC in 10/20 showed mild-moderate pulmonary venous hypertension.  RHC in 5/21 showed moderate pulmonary hypertension with low PVR and high CO, likely PH due to high output, no evidence for shunt lesion. No role for selective pulmonary vasodilators.  4. HTN: BP controlled on hydralazine and Coreg.  5. Obesity: She is on semaglutide, weight  continues to trend down.  6. GI bleeding: Recent presumed recurrent diverticular bleed in 1/24.  Apixaban stopped transiently.  7. Atypical atrial flutter/atrial fibrillation: Atypical flutter on today's ECG, persistent.  HR controlled on Coreg.  - Continue Coreg.  - Continue apixaban, she missed several doses of this at time of recent GI bleed.  - Rather than waiting another 3 wks on apixaban without missing a dose, I will go ahead and arrange for TEE-DCCV.  We discussed risks/benefits and she agrees to procedure.  This will limit somewhat the necessary exposure to apixaban (Will aim for TEE-DCCV soon, then at minimum she will need 1 month post-DCCV of apixaban).  - Watchman would be a reasonable consideration in the future, especially if she bleeds again.   8. Fe deficiency: I will arrange for  Feraheme infusion.   Followup in 4 wks with APP.   Loralie Champagne 08/01/2022

## 2022-08-02 DIAGNOSIS — Z1231 Encounter for screening mammogram for malignant neoplasm of breast: Secondary | ICD-10-CM | POA: Diagnosis not present

## 2022-08-02 LAB — HM MAMMOGRAPHY: HM Mammogram: NORMAL (ref 0–4)

## 2022-08-03 ENCOUNTER — Telehealth (HOSPITAL_COMMUNITY): Payer: Self-pay | Admitting: *Deleted

## 2022-08-03 ENCOUNTER — Ambulatory Visit (HOSPITAL_COMMUNITY): Payer: Medicare HMO

## 2022-08-06 ENCOUNTER — Telehealth (HOSPITAL_COMMUNITY): Payer: Self-pay | Admitting: *Deleted

## 2022-08-06 ENCOUNTER — Ambulatory Visit: Payer: Medicare HMO | Admitting: Neurology

## 2022-08-06 NOTE — Telephone Encounter (Signed)
Jenna/Jackie, can we do something about this? She needs to get her cardioversion.

## 2022-08-06 NOTE — Telephone Encounter (Signed)
Pt left vm stating pre service center told her she needs to pay $300 before her TEE. Pt said she can not afford this. Pre cert was obtained and authorized but she still has a copay. Pt asked that I inform Dr.McLean.

## 2022-08-07 NOTE — Telephone Encounter (Signed)
Patient called and stated she read information wrong.  She does not have to pay anything and will be here for her procedure as scheduled.

## 2022-08-08 ENCOUNTER — Other Ambulatory Visit: Payer: Self-pay | Admitting: Cardiology

## 2022-08-10 ENCOUNTER — Telehealth (HOSPITAL_COMMUNITY): Payer: Self-pay | Admitting: *Deleted

## 2022-08-10 NOTE — Telephone Encounter (Signed)
Pt left vm stating her paperwork said she would need an iron infusion and that she has not been contacted to set up infusion. I will route message to RN that checked patient out that day.

## 2022-08-14 ENCOUNTER — Telehealth (HOSPITAL_COMMUNITY): Payer: Self-pay

## 2022-08-14 NOTE — Telephone Encounter (Signed)
Called patient to remind her of procedure scheduled for tomorrow Patient  not taking ozempic  as directed since 2/14.Aware of half dose of insulin tonight and no insulin in the am.Holding lasix in the am. Has transportation to and from procedure.

## 2022-08-14 NOTE — Progress Notes (Signed)
  Chronic Care Management   Note  08/14/2022 Name: SHURONDA HEIDELBERGER MRN: [id number] DOB: January 21, 1946  ABISOLA PFEFFER is a 77 y.o. year old female who is a primary care patient of [name], MD. I reached out to [name] by phone today in response to a referral sent by Ms. Charlynn Grimes Jorstad's PCP.  The third contact attempt was unsuccessful.   Follow up plan: Unable to reach patient after 3 attempts. No further outreach attempts will be made pending additional provider engagement, patient request, or new provider order.   Noreene Larsson, [place], [place] [id number] Direct Dial: [phone] [email or web address]

## 2022-08-14 NOTE — Telephone Encounter (Signed)
Patient has been scheduled; patient aware

## 2022-08-15 ENCOUNTER — Encounter (HOSPITAL_COMMUNITY)
Admission: RE | Disposition: A | Discharge status: Home / Self Care | Payer: Self-pay | Source: Home / Self Care | Attending: Cardiology

## 2022-08-15 ENCOUNTER — Ambulatory Visit (HOSPITAL_COMMUNITY)
Admission: RE | Admit: 2022-08-15 | Discharge: 2022-08-15 | Disposition: A | Discharge status: Home / Self Care | Payer: Medicare HMO | Attending: Cardiology | Admitting: Cardiology

## 2022-08-15 ENCOUNTER — Ambulatory Visit (HOSPITAL_BASED_OUTPATIENT_CLINIC_OR_DEPARTMENT_OTHER): Payer: Medicare HMO | Admitting: Anesthesiology

## 2022-08-15 ENCOUNTER — Other Ambulatory Visit: Payer: Self-pay

## 2022-08-15 ENCOUNTER — Encounter (HOSPITAL_COMMUNITY): Payer: Self-pay | Admitting: Cardiology

## 2022-08-15 ENCOUNTER — Ambulatory Visit (HOSPITAL_COMMUNITY): Payer: Medicare HMO | Admitting: Anesthesiology

## 2022-08-15 ENCOUNTER — Ambulatory Visit (HOSPITAL_BASED_OUTPATIENT_CLINIC_OR_DEPARTMENT_OTHER): Payer: Medicare HMO

## 2022-08-15 DIAGNOSIS — I110 Hypertensive heart disease with heart failure: Secondary | ICD-10-CM

## 2022-08-15 DIAGNOSIS — Z79899 Other long term (current) drug therapy: Secondary | ICD-10-CM | POA: Diagnosis not present

## 2022-08-15 DIAGNOSIS — G4733 Obstructive sleep apnea (adult) (pediatric): Secondary | ICD-10-CM | POA: Insufficient documentation

## 2022-08-15 DIAGNOSIS — I4819 Other persistent atrial fibrillation: Secondary | ICD-10-CM

## 2022-08-15 DIAGNOSIS — E119 Type 2 diabetes mellitus without complications: Secondary | ICD-10-CM | POA: Diagnosis not present

## 2022-08-15 DIAGNOSIS — I4891 Unspecified atrial fibrillation: Secondary | ICD-10-CM | POA: Diagnosis not present

## 2022-08-15 DIAGNOSIS — I272 Pulmonary hypertension, unspecified: Secondary | ICD-10-CM | POA: Insufficient documentation

## 2022-08-15 DIAGNOSIS — Z6835 Body mass index (BMI) 35.0-35.9, adult: Secondary | ICD-10-CM | POA: Diagnosis not present

## 2022-08-15 DIAGNOSIS — E669 Obesity, unspecified: Secondary | ICD-10-CM | POA: Diagnosis not present

## 2022-08-15 DIAGNOSIS — I509 Heart failure, unspecified: Secondary | ICD-10-CM

## 2022-08-15 DIAGNOSIS — J449 Chronic obstructive pulmonary disease, unspecified: Secondary | ICD-10-CM | POA: Diagnosis not present

## 2022-08-15 DIAGNOSIS — I484 Atypical atrial flutter: Secondary | ICD-10-CM | POA: Insufficient documentation

## 2022-08-15 DIAGNOSIS — I2510 Atherosclerotic heart disease of native coronary artery without angina pectoris: Secondary | ICD-10-CM

## 2022-08-15 DIAGNOSIS — I5032 Chronic diastolic (congestive) heart failure: Secondary | ICD-10-CM | POA: Diagnosis not present

## 2022-08-15 DIAGNOSIS — Z87891 Personal history of nicotine dependence: Secondary | ICD-10-CM

## 2022-08-15 DIAGNOSIS — E611 Iron deficiency: Secondary | ICD-10-CM | POA: Diagnosis not present

## 2022-08-15 DIAGNOSIS — Z7901 Long term (current) use of anticoagulants: Secondary | ICD-10-CM | POA: Insufficient documentation

## 2022-08-15 DIAGNOSIS — I081 Rheumatic disorders of both mitral and tricuspid valves: Secondary | ICD-10-CM | POA: Diagnosis not present

## 2022-08-15 DIAGNOSIS — I252 Old myocardial infarction: Secondary | ICD-10-CM | POA: Diagnosis not present

## 2022-08-15 DIAGNOSIS — Q2111 Secundum atrial septal defect: Secondary | ICD-10-CM | POA: Diagnosis not present

## 2022-08-15 HISTORY — PX: TEE WITHOUT CARDIOVERSION: SHX5443

## 2022-08-15 HISTORY — PX: CARDIOVERSION: SHX1299

## 2022-08-15 LAB — POCT I-STAT, CHEM 8
BUN: 19 mg/dL (ref 8–23)
Calcium, Ion: 1.17 mmol/L (ref 1.15–1.40)
Chloride: 101 mmol/L (ref 98–111)
Creatinine, Ser: 1 mg/dL (ref 0.44–1.00)
Glucose, Bld: 124 mg/dL — ABNORMAL HIGH (ref 70–99)
HCT: 23 % — ABNORMAL LOW (ref 36.0–46.0)
Hemoglobin: 7.8 g/dL — ABNORMAL LOW (ref 12.0–15.0)
Potassium: 3.7 mmol/L (ref 3.5–5.1)
Sodium: 139 mmol/L (ref 135–145)
TCO2: 27 mmol/L (ref 22–32)

## 2022-08-15 SURGERY — ECHOCARDIOGRAM, TRANSESOPHAGEAL
Anesthesia: Monitor Anesthesia Care

## 2022-08-15 MED ORDER — PROPOFOL 10 MG/ML IV BOLUS
INTRAVENOUS | Status: DC | PRN
  Administered 2022-08-15: 50 mg via INTRAVENOUS
  Administered 2022-08-15: 25 mg via INTRAVENOUS

## 2022-08-15 MED ORDER — PROPOFOL 500 MG/50ML IV EMUL
INTRAVENOUS | Status: DC | PRN
  Administered 2022-08-15: 100 ug/kg/min via INTRAVENOUS

## 2022-08-15 MED ORDER — SODIUM CHLORIDE 0.9 % IV SOLN: INTRAVENOUS | Status: DC

## 2022-08-15 MED ORDER — LIDOCAINE 2% (20 MG/ML) 5 ML SYRINGE
INTRAMUSCULAR | Status: DC | PRN
  Administered 2022-08-15: 60 mg via INTRAVENOUS

## 2022-08-15 NOTE — Procedures (Signed)
Electrical Cardioversion Procedure Note [name] [id number] 1945/09/17  Procedure: Electrical Cardioversion Indications:  Atrial Fibrillation  Procedure Details Consent: Risks of procedure as well as the alternatives and risks of each were explained to the (patient/caregiver).  Consent for procedure obtained. Time Out: Verified patient identification, verified procedure, site/side was marked, verified correct patient position, special equipment/implants available, medications/allergies/relevent history reviewed, required imaging and test results available.  Performed  Patient placed on cardiac monitor, pulse oximetry, supplemental oxygen as necessary.  Sedation given:  Propofol per anesthesiology Pacer pads placed anterior and posterior chest.  Cardioverted 1 time(s).  Cardioverted at [place].  Evaluation Findings: Post procedure EKG shows: NSR Complications: None Patient did tolerate procedure well.   Loralie Champagne 08/15/2022, 12:00 PM

## 2022-08-15 NOTE — Anesthesia Preprocedure Evaluation (Signed)
Anesthesia Evaluation  Patient identified by MRN, date of birth, ID band Patient awake    Reviewed: Allergy & Precautions, H&P , NPO status , Patient's Chart, lab work & pertinent test results  Airway Mallampati: II   Neck ROM: full    Dental  (+) Dental Advisory Given, Missing, Poor Dentition   Pulmonary shortness of breath, sleep apnea , pneumonia, COPD, former smoker   breath sounds clear to auscultation       Cardiovascular hypertension, pulmonary hypertension+ CAD, + Past MI and +CHF  + Valvular Problems/Murmurs  Rhythm:regular Rate:Normal + Systolic murmurs Echo [id number] 1. Left ventricular ejection fraction, by estimation, is 60 to 65%. The left ventricle has normal function. The left ventricle has no regional wall motion abnormalities. There is mild left ventricular hypertrophy. Left ventricular diastolic parameters are consistent with Grade I diastolic dysfunction (impaired relaxation).   2. Right ventricular systolic function is normal. The right ventricular size is normal. There is normal pulmonary artery systolic pressure.   3. Left atrial size was severely dilated.   4. The mitral valve is normal in structure. No evidence of mitral valve regurgitation. No evidence of mitral stenosis.   5. The aortic valve is normal in structure. There is mild calcification of the aortic valve. There is mild thickening of the aortic valve. Aortic valve regurgitation is not visualized. Mild aortic valve sclerosis is present, with no evidence of aortic valve stenosis.   6. The inferior vena cava is normal in size with greater than 50% respiratory variability, suggesting right atrial pressure of 3 mmHg.     Neuro/Psych  Headaches, Seizures -,  PSYCHIATRIC DISORDERS  Depression       GI/Hepatic ,GERD  ,,  Endo/Other  diabetes    Renal/GU Renal disease     Musculoskeletal  (+) Arthritis ,    Abdominal   Peds  Hematology  (+) Blood  dyscrasia, anemia   Anesthesia Other Findings   Reproductive/Obstetrics                              Anesthesia Physical Anesthesia Plan  ASA: 4  Anesthesia Plan: MAC   Post-op Pain Management:    Induction: Intravenous  PONV Risk Score and Plan: 2 and Ondansetron, Propofol infusion and Treatment may vary due to age or medical condition  Airway Management Planned: Simple Face Mask  Additional Equipment:   Intra-op Plan:   Post-operative Plan: Extubation in OR  Informed Consent: I have reviewed the patients History and Physical, chart, labs and discussed the procedure including the risks, benefits and alternatives for the proposed anesthesia with the patient or authorized representative who has indicated his/her understanding and acceptance.     Dental advisory given  Plan Discussed with: CRNA, Anesthesiologist and Surgeon  Anesthesia Plan Comments:          Anesthesia Quick Evaluation

## 2022-08-15 NOTE — CV Procedure (Signed)
Procedure: TEE   Sedation: Per anesthesiology  Indication: Atrial fibrillation  Findings: Please see echo section for full report. Normal LV size with EF 60-65%, mild LV hypertrophy, no regional wall motion abnormalities.  RV is mildly dilated with normal systolic function.  Moderate-severe LAE, no LA appendage thrombus.  Measurements done for Watchman. Moderate right atrial enlargement.  There is a small 1.9 x 1.2 cm relatively high secundum ASD with left to right flow.  The pulmonary veins appear to drain normally to the left atrium.  Trivial mitral regurgitation. Mild tricuspid regurgitation with peak RV-RA gradient 31 mmHg.  Trileaflet aortic valve with no stenosis or regurgitation.    May proceed to TEE  Loralie Champagne 08/15/2022 12:00 PM

## 2022-08-15 NOTE — Discharge Instructions (Signed)
TEE  YOU HAD AN CARDIAC PROCEDURE TODAY: Refer to the procedure report and other information in the discharge instructions given to you for any specific questions about what was found during the examination. If this information does not answer your questions, please call [place] office at [phone] to clarify.   DIET: Your first meal following the procedure should be a light meal and then it is ok to progress to your normal diet. A half-sandwich or bowl of soup is an example of a good first meal. Heavy or fried foods are harder to digest and may make you feel nauseous or bloated. Drink plenty of fluids but you should avoid alcoholic beverages for 24 hours. If you had a esophageal dilation, please see attached instructions for diet.   ACTIVITY: Your care partner should take you home directly after the procedure. You should plan to take it easy, moving slowly for the rest of the day. You can resume normal activity the day after the procedure however YOU SHOULD NOT DRIVE, use power tools, machinery or perform tasks that involve climbing or major physical exertion for 24 hours (because of the sedation medicines used during the test).   SYMPTOMS TO REPORT IMMEDIATELY: A cardiologist can be reached at any hour. Please call [phone] for any of the following symptoms:  Vomiting of blood or coffee ground material  New, significant abdominal pain  New, significant chest pain or pain under the shoulder blades  Painful or persistently difficult swallowing  New shortness of breath  Black, tarry-looking or red, bloody stools  FOLLOW UP:  Please also call with any specific questions about appointments or follow up tests.   Electrical Cardioversion Electrical cardioversion is the delivery of a jolt of electricity to restore a normal rhythm to the heart. A rhythm that is too fast or is not regular keeps the heart from pumping well. In this procedure, sticky patches or metal paddles are placed on  the chest to deliver electricity to the heart from a device. This procedure may be done in an emergency if: There is low or no blood pressure as a result of the heart rhythm. Normal rhythm must be restored as fast as possible to protect the brain and heart from further damage. It may save a life. This may also be a scheduled procedure for irregular or fast heart rhythms that are not immediately life-threatening.  What can I expect after the procedure? Your blood pressure, heart rate, breathing rate, and blood oxygen level will be monitored until you leave the hospital or clinic. Your heart rhythm will be watched to make sure it does not change. You may have some redness on the skin where the shocks were given. Over the counter cortizone cream may be helpful.  Follow these instructions at home: Do not drive for 24 hours if you were given a sedative during your procedure. Take over-the-counter and prescription medicines only as told by your health care provider. Ask your health care provider how to check your pulse. Check it often. Rest for 48 hours after the procedure or as told by your health care provider. Avoid or limit your caffeine use as told by your health care provider. Keep all follow-up visits as told by your health care provider. This is important. Contact a health care provider if: You feel like your heart is beating too quickly or your pulse is not regular. You have a serious muscle cramp that does not go away. Get help right away if: You have discomfort  in your chest. You are dizzy or you feel faint. You have trouble breathing or you are short of breath. Your speech is slurred. You have trouble moving an arm or leg on one side of your body. Your fingers or toes turn cold or blue. Summary Electrical cardioversion is the delivery of a jolt of electricity to restore a normal rhythm to the heart. This procedure may be done right away in an emergency or may be a scheduled  procedure if the condition is not an emergency. Generally, this is a safe procedure. After the procedure, check your pulse often as told by your health care provider. This information is not intended to replace advice given to you by your health care provider. Make sure you discuss any questions you have with your health care provider. Document Revised: 01/12/2019 Document Reviewed: 01/12/2019 Elsevier Patient Education  [employer].

## 2022-08-15 NOTE — Interval H&P Note (Signed)
History and Physical Interval Note:  08/15/2022 11:31 AM  [name]  has presented today for surgery, with the diagnosis of AFIB.  The various methods of treatment have been discussed with the patient and family. After consideration of risks, benefits and other options for treatment, the patient has consented to  Procedure(s): TRANSESOPHAGEAL ECHOCARDIOGRAM (TEE) (N/A) CARDIOVERSION (N/A) as a surgical intervention.  The patient's history has been reviewed, patient examined, no change in status, stable for surgery.  I have reviewed the patient's chart and labs.  Questions were answered to the patient's satisfaction.     [name] [employer]

## 2022-08-15 NOTE — Transfer of Care (Signed)
Immediate Anesthesia Transfer of Care Note  Patient: [name]  Procedure(s) Performed: TRANSESOPHAGEAL ECHOCARDIOGRAM (TEE) CARDIOVERSION  Patient Location: Endoscopy Unit  Anesthesia Type:MAC  Level of Consciousness: awake, alert , and oriented  Airway & Oxygen Therapy: Patient Spontanous Breathing  Post-op Assessment: Report given to RN and Post -op Vital signs reviewed and stable  Post vital signs: Reviewed and stable  Last Vitals:  Vitals Value Taken Time  BP 109/65 08/15/22 1154  Temp 98   Pulse 57 08/15/22 1200  Resp 14 08/15/22 1200  SpO2 100 % 08/15/22 1200  Vitals shown include unvalidated device data.  Last Pain:  Vitals:   08/15/22 1154  TempSrc:   PainSc: Asleep         Complications: No notable events documented.

## 2022-08-16 LAB — ECHO TEE

## 2022-08-16 NOTE — Anesthesia Postprocedure Evaluation (Signed)
Anesthesia Post Note  Patient: [name]  Procedure(s) Performed: TRANSESOPHAGEAL ECHOCARDIOGRAM (TEE) CARDIOVERSION     Patient location during evaluation: PACU Anesthesia Type: MAC Level of consciousness: awake and alert Pain management: pain level controlled Vital Signs Assessment: post-procedure vital signs reviewed and stable Respiratory status: spontaneous breathing Cardiovascular status: stable Anesthetic complications: no   No notable events documented.  Last Vitals:  Vitals:   08/15/22 1202 08/15/22 1215  BP: 112/65 (!) 117/94  Pulse: (!) 58 (!) 57  Resp: 16 16  Temp:    SpO2: 100% 98%    Last Pain:  Vitals:   08/15/22 1215  TempSrc:   PainSc: 0-No pain                 Nolon Nations

## 2022-08-17 ENCOUNTER — Other Ambulatory Visit (HOSPITAL_COMMUNITY): Payer: Self-pay

## 2022-08-17 ENCOUNTER — Telehealth (HOSPITAL_COMMUNITY): Payer: Self-pay

## 2022-08-17 DIAGNOSIS — I5032 Chronic diastolic (congestive) heart failure: Secondary | ICD-10-CM

## 2022-08-17 NOTE — Telephone Encounter (Signed)
Spoke with patient and sister. RHC scheduled for 08/09/22 @ 10.30. explained all instructions to both patient and sister. Also mailing letter with the instructions. Both understood instructions.

## 2022-08-19 ENCOUNTER — Encounter (HOSPITAL_COMMUNITY): Payer: Self-pay | Admitting: Cardiology

## 2022-08-21 ENCOUNTER — Telehealth: Payer: Self-pay | Admitting: *Deleted

## 2022-08-22 ENCOUNTER — Other Ambulatory Visit (HOSPITAL_COMMUNITY): Payer: Self-pay | Admitting: Family Medicine

## 2022-08-24 ENCOUNTER — Ambulatory Visit (HOSPITAL_COMMUNITY)
Admission: RE | Admit: 2022-08-24 | Discharge: 2022-08-24 | Disposition: A | Discharge status: Home / Self Care | Payer: Medicare HMO | Source: Ambulatory Visit | Attending: Cardiology | Admitting: Cardiology

## 2022-08-24 ENCOUNTER — Encounter (HOSPITAL_COMMUNITY): Payer: Self-pay

## 2022-08-24 VITALS — BP 130/78 | HR 64 | Wt 184.0 lb

## 2022-08-24 DIAGNOSIS — Z8719 Personal history of other diseases of the digestive system: Secondary | ICD-10-CM | POA: Diagnosis not present

## 2022-08-24 DIAGNOSIS — E611 Iron deficiency: Secondary | ICD-10-CM | POA: Insufficient documentation

## 2022-08-24 DIAGNOSIS — I110 Hypertensive heart disease with heart failure: Secondary | ICD-10-CM | POA: Insufficient documentation

## 2022-08-24 DIAGNOSIS — I5032 Chronic diastolic (congestive) heart failure: Secondary | ICD-10-CM | POA: Insufficient documentation

## 2022-08-24 DIAGNOSIS — I484 Atypical atrial flutter: Secondary | ICD-10-CM | POA: Diagnosis not present

## 2022-08-24 DIAGNOSIS — J439 Emphysema, unspecified: Secondary | ICD-10-CM | POA: Diagnosis not present

## 2022-08-24 DIAGNOSIS — Z7985 Long-term (current) use of injectable non-insulin antidiabetic drugs: Secondary | ICD-10-CM | POA: Diagnosis not present

## 2022-08-24 DIAGNOSIS — G4733 Obstructive sleep apnea (adult) (pediatric): Secondary | ICD-10-CM | POA: Diagnosis not present

## 2022-08-24 DIAGNOSIS — I10 Essential (primary) hypertension: Secondary | ICD-10-CM

## 2022-08-24 DIAGNOSIS — I272 Pulmonary hypertension, unspecified: Secondary | ICD-10-CM | POA: Diagnosis not present

## 2022-08-24 DIAGNOSIS — Z8616 Personal history of COVID-19: Secondary | ICD-10-CM | POA: Diagnosis not present

## 2022-08-24 DIAGNOSIS — Z7901 Long term (current) use of anticoagulants: Secondary | ICD-10-CM | POA: Diagnosis not present

## 2022-08-24 DIAGNOSIS — J8410 Pulmonary fibrosis, unspecified: Secondary | ICD-10-CM | POA: Insufficient documentation

## 2022-08-24 DIAGNOSIS — Z6834 Body mass index (BMI) 34.0-34.9, adult: Secondary | ICD-10-CM | POA: Diagnosis not present

## 2022-08-24 DIAGNOSIS — E669 Obesity, unspecified: Secondary | ICD-10-CM | POA: Diagnosis not present

## 2022-08-24 DIAGNOSIS — E119 Type 2 diabetes mellitus without complications: Secondary | ICD-10-CM | POA: Diagnosis not present

## 2022-08-24 DIAGNOSIS — Q2111 Secundum atrial septal defect: Secondary | ICD-10-CM | POA: Diagnosis not present

## 2022-08-24 DIAGNOSIS — I4510 Unspecified right bundle-branch block: Secondary | ICD-10-CM | POA: Insufficient documentation

## 2022-08-24 DIAGNOSIS — Z87891 Personal history of nicotine dependence: Secondary | ICD-10-CM | POA: Diagnosis not present

## 2022-08-24 DIAGNOSIS — Q211 Atrial septal defect, unspecified: Secondary | ICD-10-CM | POA: Diagnosis not present

## 2022-08-24 DIAGNOSIS — Z794 Long term (current) use of insulin: Secondary | ICD-10-CM | POA: Insufficient documentation

## 2022-08-24 DIAGNOSIS — I4891 Unspecified atrial fibrillation: Secondary | ICD-10-CM | POA: Diagnosis not present

## 2022-08-24 DIAGNOSIS — Z8249 Family history of ischemic heart disease and other diseases of the circulatory system: Secondary | ICD-10-CM | POA: Insufficient documentation

## 2022-08-24 DIAGNOSIS — Z79899 Other long term (current) drug therapy: Secondary | ICD-10-CM | POA: Diagnosis not present

## 2022-08-24 DIAGNOSIS — E662 Morbid (severe) obesity with alveolar hypoventilation: Secondary | ICD-10-CM | POA: Diagnosis not present

## 2022-08-24 LAB — BASIC METABOLIC PANEL
Anion gap: 12 (ref 5–15)
BUN: 13 mg/dL (ref 8–23)
CO2: 26 mmol/L (ref 22–32)
Calcium: 8.8 mg/dL — ABNORMAL LOW (ref 8.9–10.3)
Chloride: 98 mmol/L (ref 98–111)
Creatinine, Ser: 0.99 mg/dL (ref 0.44–1.00)
GFR, Estimated: 60 mL/min — ABNORMAL LOW (ref >60)
Glucose, Bld: 155 mg/dL — ABNORMAL HIGH (ref 70–99)
Potassium: 4 mmol/L (ref 3.5–5.1)
Sodium: 136 mmol/L (ref 135–145)

## 2022-08-24 MED ORDER — SODIUM CHLORIDE 0.9 % IV SOLN
510.0000 mg | Freq: Once | INTRAVENOUS | Status: AC
  Administered 2022-08-24: 510 mg via INTRAVENOUS
  Filled 2022-08-24: qty 17

## 2022-08-24 NOTE — Progress Notes (Signed)
PCP: [name], MD Cardiology: Dr. Ellyn Hack HF Cardiology: Dr. [name]  77 y.o. with history of OHS/OSA and RV dysfunction was referred by Dr. Ellyn Hack for evaluation of CHF.  She has a history of OSA/OHS.  She uses CPAP at night but not currently using oxygen during the day.  Remote smoker.  She had an echo in 9/20 showing EF 50-55% but moderate-severe RV dysfunction with mild-moderate RV dilation.  RHC/LHC in 10/20 showed normal coronaries, pulmonary venous hypertension.  V/Q scan in 2/21 did not show evidence for acute on chronic PE.    Cardiomems was placed in 5/21.  RHC at that time showed normal filling pressures and moderate pulmonary hypertension but suspect due to high output.  No evidence for shunt lesion.  Echo showed EF 60-65%, enlarged RV with normal systolic function.   She was admitted in 5/21 with syncope, thought to be due to a seizure.    She had COVID-19 infection in 8/21. 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   Echo in 9/21 with EF 50-55%, severe RV enlargement and severely decreased RV systolic function, PASP 54 mmHg.    She was admitted in 5/22 with CHF, bronchitis.  She was diuresed and treated with antibiotics. Echo in 5/22 showed EF 60-65%, mild LVH, ?normal RV, severe LAE, ?normal PA pressure, IVC normal.  V/Q scan negative.   In 10/22, she was admitted with GI bleeding, thought to be diverticular.  BP meds were decreased and Lasix was cut back.  With increased weight, she increased her Lasix back to 80 mg daily.   She was seen in the ER in 1/24 with atypical atrial flutter, this was new.  She was started on apixaban and sent home from ER. She was seen for follow up and planned to set her up for DCCV, but she was admitted on 07/25/22 with GI bleed, thought to be diverticular.  Apixaban was held and she was tranfused. Apixaban was restarted after discharge.  She did  not have endoscopy.   Follow up 08/01/22, she remained in atypical atrial flutter. Arranged for TEE/DCCV.  TEE/DCCV showed EF 60-65%, mild LVH, RV normal, small ASD to L->R flow, no LAA thrombus. S/p DCCV --> NSR.  Today she returns for post TEE/DCCV HF follow up. Overall feeling fine. She has SOB with house chores like vacuuming or sweeping floors. She is SOB walking into clinic today. She is limited by knee OA. She has a chronic cough. Occasional dizziness, walks with a cane for balance when outside of the house. Denies palpitations, abnormal bleeding, CP,  edema, or PND/Orthopnea. Appetite ok. No fever or chills. Weight at home 179-180 pounds. Taking all medications. She had an iron infusion today.   ECG (personally reviewed): NSR with 1AVB, 60 bpm  Cardiomems PADP 19 mmHg on 08/23/22 (goal 20).   Labs (10/20): K 4.1, creatinine 1.15, hgb 11.4 Labs (6/21): K 3.5, creatinine 1.04, BNP 81 Labs (12/21): K 3.7, creatinine 0.79 Labs (11/22): LDL 63, BNP 33 Labs (2/23): K 3.8, creatinine 0.97 Labs (9/23): K 4.1, creatinine 0.97 Labs (1/24): K 3.5, creatinine 0.92, BNP 167 Labs (2/24): transferrin saturation 7%, hgb 8.1  PMH: 1. OHS/OSA: She uses CPAP.  2. H/o CVA 3. Seizure disorder 4. Type 2 DM 5. Depression 6. HTN 7. Hyperlipidemia 8. Chronic diastolic CHF: Echo (123456) with EF 50-55%, moderate to severe RV dilation with mild-moderately decreased RV systolic function.  - RHC/LHC (  10/20): normal coronaries; mean RA 10, PA 56/14 mean 31, mean PCWP 13, LVEDP 18, CO/CI 7.35/3.73, PVR 2.44.  - RHC/Cardiomems placement (5/21): mean RA 7, PA 58/21 mean 34, PCWP mean 12, CI 8.3 F/3.95 T, PVR 2.86 WU. No evidence for shunt lesion.  - Echo (5/21): EF 60-65%, RV enlarged with normal systolic function.  - Echo (9/21): EF 50-55%, severe RV enlargement and severely decreased RV systolic function, PASP 54 mmHg.  - Echo (5/22): EF 60-65%, mild LVH, ?normal RV, severe LAE, ?normal PA pressure, IVC  normal. - TEE (2/23): EF 60-65%, mild LVH, RV normal 9. Primarily pulmonary venous hypertension.  - V/Q scan (2/21, 5/22): No evidence for acute or chronic PE.  - PFTs normal in 5/21.  10. COVID-19 PNA 8/21.  - CT chest in 10/21 with emphysema and mild fibrosis concerning for post-infectious inflammatory fibrosis from COVID.  11. GI bleeding: 10/22, diverticular bleed.  - Presumed diverticular bleed in 1/24.  12. Atypical atrial flutter/atrial fibrillation: First noted 1/24.  - TEE/DCCV (2/24) to NSR 13. ASD:   Social History   Socioeconomic History   Marital status: Divorced    Spouse name: Not on file   Number of children: Not on file   Years of education: Not on file   Highest education level: Not on file  Occupational History   Occupation: Disabled    Comment: CNA  Tobacco Use   Smoking status: Former    Packs/day: 0.50    Years: 25.00    Total pack years: 12.50    Types: Cigarettes   Smokeless tobacco: Never   Tobacco comments:    quit smoking 20+ytrs ago  Vaping Use   Vaping Use: Never used  Substance and Sexual Activity   Alcohol use: No   Drug use: No   Sexual activity: Not Currently    Birth control/protection: Surgical  Other Topics Concern   Not on file  Social History Narrative   She lives in [place]. She has lots of family in the area and is accompanied by her sister.   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   She has a distant history of smoking, quit 20 years ago.   Right handed    Social Determinants of Health   Financial Resource Strain: Low Risk  (07/18/2022)   Overall Financial Resource Strain (CARDIA)    Difficulty of Paying Living Expenses: Not hard at all  Food Insecurity: No Food Insecurity (07/18/2022)   Hunger Vital Sign    Worried About Running Out of Food in the Last Year: Never true    Ran Out of Food in the Last Year: Never true  Transportation Needs: No Transportation Needs (07/18/2022)   PRAPARE  - Hydrologist (Medical): No    Lack of Transportation (Non-Medical): No  Physical Activity: Inactive (07/18/2022)   Exercise Vital Sign    Days of Exercise per Week: 0 days    Minutes of Exercise per Session: 0 min  Stress: No Stress Concern Present (07/18/2022)   [place]    Feeling of Stress : Not at all  Social Connections: Not on file  Intimate Partner Violence: Not At Risk (10/22/2018)   Humiliation, Afraid, Rape, and Kick questionnaire    Fear of Current or Ex-Partner: No    Emotionally Abused: No    Physically Abused: No    Sexually Abused: No   Family History  Problem Relation Age of Onset   Allergies Father    Heart disease Father        before 15   Heart failure Father    Hypertension Father    Hyperlipidemia Father    Heart disease Mother    Diabetes Mother    Hypertension Mother    Hyperlipidemia Mother    Hypertension Sister    Heart disease Sister        before 32   Hyperlipidemia Sister    Diabetes Brother    Hypertension Brother    Diabetes Sister    Hypertension Sister    Diabetes Son    Hypertension Son    Cancer Other    Current Outpatient Medications  Medication Sig Dispense Refill   acetaminophen (TYLENOL) 500 MG tablet Take 500-1,000 mg by mouth every 4 (four) hours as needed for moderate pain.     albuterol (VENTOLIN HFA) 108 (90 Base) MCG/ACT inhaler Inhale 1-2 puffs into the lungs every 6 (six) hours as needed for wheezing or shortness of breath. 18 g 5   Alcohol Swabs (DROPSAFE ALCOHOL PREP) 70 % PADS USE TWO TIMES DAILY AS DIRECTED 200 each 6   apixaban (ELIQUIS) 5 MG TABS tablet Take 1 tablet (5 mg total) by mouth 2 (two) times daily. 60 tablet    Ascorbic Acid (VITAMIN C) 1000 MG tablet Take 1,000 mg by mouth daily.     atorvastatin (LIPITOR) 10 MG tablet TAKE 1 TABLET EVERY EVENING 90 tablet 2   B-D ULTRAFINE III SHORT PEN 31G X 8 MM MISC USE  DAILY WITH VICTOZA AND/OR NOVOLOG. 400 each 2   benzonatate (TESSALON) 100 MG capsule Take 1 capsule (100 mg total) by mouth 3 (three) times daily as needed for cough. 30 capsule 0   Blood Glucose Monitoring Suppl (TRUE METRIX METER) w/Device KIT USE AS DIRECTED 1 kit 1   carvedilol (COREG) 12.5 MG tablet TAKE 1 TABLET TWICE DAILY 180 tablet 3   dexlansoprazole (DEXILANT) 60 MG capsule Take 60 mg by mouth daily.     diclofenac sodium (VOLTAREN) 1 % GEL Apply 2 g topically 4 (four) times daily as needed (pain).     ferrous sulfate 324 MG TBEC Take 324 mg by mouth daily with breakfast.     furosemide (LASIX) 20 MG tablet TAKE 4 TABLETS (80 MG) BY MOUTH IN THE MORNING AND 3 TABLETS (60 MG) EVERY EVENING. (Patient taking differently: Take 60-80 mg by mouth See admin instructions. Take 80 mg in the morning and 60 mg in the evening) 630 tablet 3   Glucagon (GVOKE HYPOPEN 2-PACK) 0.5 MG/0.1ML SOAJ INJECT 0.5 MG INTO THE SKIN DAILY AS NEEDED. 2 mL 3   guaiFENesin-dextromethorphan (ROBITUSSIN DM) 100-10 MG/5ML syrup Take 10 mLs by mouth every 6 (six) hours as needed for cough. 118 mL 0   hydrALAZINE (APRESOLINE) 100 MG tablet Take 1 tablet (100 mg total) by mouth 3 (three) times daily. 270 tablet 3   hydrocortisone cream 1 % Apply 1 application topically daily as needed for itching.      insulin glargine, 1 Unit Dial, (TOUJEO) 300 UNIT/ML Solostar Pen Inject 20 Units into the skin at bedtime.     insulin lispro (HUMALOG KWIKPEN) 100 UNIT/ML KwikPen Inject 0.02-0.04 mLs (2-4 Units total) into the skin 3 (three) times daily as needed (high blood sugar over 150). (Patient taking differently: Inject 0-10 Units into the skin See admin instructions. Sliding scale Inject  (high blood sugar over 150).  150-199=2  units 200-249=4 units 250-299=6 units 300-349=8 units 350 above 10 units Over 400 call physician) 15 mL 2   Isopropyl Alcohol (ALCOHOL WIPES) 70 % MISC Apply 1 each topically 2 (two) times daily. 300  each 3   isosorbide mononitrate (IMDUR) 30 MG 24 hr tablet TAKE 1 TABLET EVERY DAY (Patient taking differently: Take 30 mg by mouth at bedtime.) 90 tablet 2   lamoTRIgine (LAMICTAL) 150 MG tablet Take 1 tablet (150 mg total) by mouth 2 (two) times daily. 180 tablet 3   levETIRAcetam (KEPPRA) 500 MG tablet Take 2 tablets every night 180 tablet 3   ondansetron (ZOFRAN) 4 MG tablet Take 1 tablet (4 mg total) by mouth every 8 (eight) hours as needed for nausea or vomiting. 20 tablet 1   OZEMPIC, 1 MG/DOSE, 4 MG/3ML SOPN INJECT 1 MG INTO THE SKIN ONCE A WEEK. 9 mL 3   Pancrelipase, Lip-Prot-Amyl, 25000-79000 units CPEP Take 1-2 capsules by mouth See admin instructions. Take 2 capsules in the morning with breakfast, 1 capsule with lunch, and 2 capsules with supper     potassium chloride SA (KLOR-CON M) 20 MEQ tablet Take 2 tablets (40 mEq total) by mouth daily. With additional 60mq on metolazone days 300 tablet 3   sennosides-docusate sodium (SENOKOT-S) 8.6-50 MG tablet Take 1-2 tablets by mouth at bedtime as needed for constipation.     sertraline (ZOLOFT) 50 MG tablet TAKE 1 TABLET EVERY DAY 90 tablet 2   tiZANidine (ZANAFLEX) 2 MG tablet TAKE 1 TABLET AT BEDTIME AS NEEDED 30 tablet 3   tolnaftate (TINACTIN) 1 % cream Apply 1 application topically daily as needed (foot fungus).      TRUE METRIX BLOOD GLUCOSE TEST test strip USE AS DIRECTED TO CHECK BLOOD SUGARS 2 TIMES PER DAY 200 strip 10   TRUEplus Lancets 33G MISC TEST BLOOD SUGAR TWICE DAILY AS DIRECTED 200 each 3   No current facility-administered medications for this encounter.   Wt Readings from Last 3 Encounters:  08/24/22 83.5 kg (184 lb)  08/24/22 81.2 kg (179 lb)  08/15/22 84.8 kg (187 lb)   BP 130/78   Pulse 64   Wt 83.5 kg (184 lb)   SpO2 97%   BMI 34.77 kg/m  Physical Exam General:  NAD. No resp difficulty, walked into clinic with cane HEENT: Normal Neck: Supple. No JVD. Carotids 2+ bilat; no bruits. No lymphadenopathy or  thryomegaly appreciated. Cor: PMI nondisplaced. Regular rate & rhythm. No rubs, gallops or murmurs. Lungs: Clear Abdomen: Soft, nontender, nondistended. No hepatosplenomegaly. No bruits or masses. Good bowel sounds. Extremities: No cyanosis, clubbing, rash, edema Neuro: Alert & oriented x 3, cranial nerves grossly intact. Moves all 4 extremities w/o difficulty. Affect pleasant.  Assessment/Plan: 1. Chronic diastolic CHF/RV failure: Echo in 9/20 showed EF 50-55% with moderate-severe RV dilation and mild-moderately decreased systolic function. RHC/LHC showed mild-moderate pulmonary venous hypertension and no significant coronary disease.  Cause of RV dysfunction is not clear, possibly due to diastolic CHF + OHS/OSA.  V/Q scan in 2/21 did not show acute or chronic PE.  Cardiomems was placed in 5/21, normal filling pressures with moderate pulmonary hypertension likely due to high output (no evidence for shunt lesion on RHC).  Echo in 9/21 with EF 50-55%, severe RVE with severe RV dysfunction.  Echo in 5/22 with EF 60-65%, RV function reportedly normal with normal PA pressure.  Her dyspnea is partially related to lung disease; based on last CT, she has developed mild pulmonary fibrosis since COVID,  possibly post-inflammatory.  TEE (2/24) showed EF 60-65%, RV normal. Cardiomems remains 19 mmHg yesterday which is at goal, she is not volume overloaded on exam today and weight has trended down.  NYHA class II-III.  - Continue Lasix 80 qam/60 qpm.  BMET today.  - Continue Coreg 12.5 mg bid. - She did not tolerate SGLT2-inhibitor due to recurrent yeast infections.    2. OHS/OSA: She is using CPAP but not oxygen during the day (no longer appears to need). CT chest in 10/21 with emphysema and mild fibrosis, possibly post-inflammatory after COVID-19 infection.   - Followup with pulmonary (Dr. Vaughan Browner).  3. Pulmonary hypertension: RHC in 10/20 showed mild-moderate pulmonary venous hypertension.  RHC in 5/21 showed  moderate pulmonary hypertension with low PVR and high CO, likely PH due to high output, no evidence for shunt lesion. No role for selective pulmonary vasodilators.  - On recent TEE (2/24), small secundum ASD seen with left to right flow. Arranging to [place] to further evaluate. 4. HTN: BP controlled on hydralazine, Imdur and Coreg.  5. Obesity: Body mass index is 34.77 kg/m. - She is on semaglutide, weight continues to trend down.  6. GI bleeding: Recent presumed recurrent diverticular bleed in 1/24.  Apixaban stopped transiently.  7. Atypical atrial flutter/atrial fibrillation: Atypical flutter at follow up 2/24.  HR controlled on Coreg. S/p TEE/DCCV to NSR. She remains in NSR today. - Continue Coreg.  - Continue apixaban. At minimum she will need 1 month post-DCCV of apixaban.  - Watchman would be a reasonable consideration in the future, especially if she bleeds again.   8. Fe deficiency: She has had Feraheme infusion today.  9. ? ASD: small 1.9 x 1.2 cm relatively high secundum ASD with left to right flow.  - She has [place] arranged 09/07/22.  Follow up with APP 2-3 weeks after cath.  Maricela Bo [facility] FNP-BC 08/24/2022

## 2022-08-24 NOTE — H&P (View-Only) (Signed)
PCP: Sanders, Robyn, MD Cardiology: Dr. Harding HF Cardiology: Dr. McLean  77 y.o. with history of OHS/OSA and RV dysfunction was referred by Dr. Harding for evaluation of CHF.  She has a history of OSA/OHS.  She uses CPAP at night but not currently using oxygen during the day.  Remote smoker.  She had an echo in 9/20 showing EF 50-55% but moderate-severe RV dysfunction with mild-moderate RV dilation.  RHC/LHC in 10/20 showed normal coronaries, pulmonary venous hypertension.  V/Q scan in 2/21 did not show evidence for acute on chronic PE.    Cardiomems was placed in 5/21.  RHC at that time showed normal filling pressures and moderate pulmonary hypertension but suspect due to high output.  No evidence for shunt lesion.  Echo showed EF 60-65%, enlarged RV with normal systolic function.   She was admitted in 5/21 with syncope, thought to be due to a seizure.    She had COVID-19 infection in 8/21. 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   Echo in 9/21 with EF 50-55%, severe RV enlargement and severely decreased RV systolic function, PASP 54 mmHg.    She was admitted in 5/22 with CHF, bronchitis.  She was diuresed and treated with antibiotics. Echo in 5/22 showed EF 60-65%, mild LVH, ?normal RV, severe LAE, ?normal PA pressure, IVC normal.  V/Q scan negative.   In 10/22, she was admitted with GI bleeding, thought to be diverticular.  BP meds were decreased and Lasix was cut back.  With increased weight, she increased her Lasix back to 80 mg daily.   She was seen in the ER in 1/24 with atypical atrial flutter, this was new.  She was started on apixaban and sent home from ER. She was seen for follow up and planned to set her up for DCCV, but she was admitted on 07/25/22 with GI bleed, thought to be diverticular.  Apixaban was held and she was tranfused. Apixaban was restarted after discharge.  She did  not have endoscopy.   Follow up 08/01/22, she remained in atypical atrial flutter. Arranged for TEE/DCCV.  TEE/DCCV showed EF 60-65%, mild LVH, RV normal, small ASD to L->R flow, no LAA thrombus. S/p DCCV --> NSR.  Today she returns for post TEE/DCCV HF follow up. Overall feeling fine. She has SOB with house chores like vacuuming or sweeping floors. She is SOB walking into clinic today. She is limited by knee OA. She has a chronic cough. Occasional dizziness, walks with a cane for balance when outside of the house. Denies palpitations, abnormal bleeding, CP,  edema, or PND/Orthopnea. Appetite ok. No fever or chills. Weight at home 179-180 pounds. Taking all medications. She had an iron infusion today.   ECG (personally reviewed): NSR with 1AVB, 60 bpm  Cardiomems PADP 19 mmHg on 08/23/22 (goal 20).   Labs (10/20): K 4.1, creatinine 1.15, hgb 11.4 Labs (6/21): K 3.5, creatinine 1.04, BNP 81 Labs (12/21): K 3.7, creatinine 0.79 Labs (11/22): LDL 63, BNP 33 Labs (2/23): K 3.8, creatinine 0.97 Labs (9/23): K 4.1, creatinine 0.97 Labs (1/24): K 3.5, creatinine 0.92, BNP 167 Labs (2/24): transferrin saturation 7%, hgb 8.1  PMH: 1. OHS/OSA: She uses CPAP.  2. H/o CVA 3. Seizure disorder 4. Type 2 DM 5. Depression 6. HTN 7. Hyperlipidemia 8. Chronic diastolic CHF: Echo (9/20) with EF 50-55%, moderate to severe RV dilation with mild-moderately decreased RV systolic function.  - RHC/LHC (  10/20): normal coronaries; mean RA 10, PA 56/14 mean 31, mean PCWP 13, LVEDP 18, CO/CI 7.35/3.73, PVR 2.44.  - RHC/Cardiomems placement (5/21): mean RA 7, PA 58/21 mean 34, PCWP mean 12, CI 8.3 F/3.95 T, PVR 2.86 WU. No evidence for shunt lesion.  - Echo (5/21): EF 60-65%, RV enlarged with normal systolic function.  - Echo (9/21): EF 50-55%, severe RV enlargement and severely decreased RV systolic function, PASP 54 mmHg.  - Echo (5/22): EF 60-65%, mild LVH, ?normal RV, severe LAE, ?normal PA pressure, IVC  normal. - TEE (2/23): EF 60-65%, mild LVH, RV normal 9. Primarily pulmonary venous hypertension.  - V/Q scan (2/21, 5/22): No evidence for acute or chronic PE.  - PFTs normal in 5/21.  10. COVID-19 PNA 8/21.  - CT chest in 10/21 with emphysema and mild fibrosis concerning for post-infectious inflammatory fibrosis from COVID.  11. GI bleeding: 10/22, diverticular bleed.  - Presumed diverticular bleed in 1/24.  12. Atypical atrial flutter/atrial fibrillation: First noted 1/24.  - TEE/DCCV (2/24) to NSR 13. ASD:   Social History   Socioeconomic History   Marital status: Divorced    Spouse name: Not on file   Number of children: Not on file   Years of education: Not on file   Highest education level: Not on file  Occupational History   Occupation: Disabled    Comment: CNA  Tobacco Use   Smoking status: Former    Packs/day: 0.50    Years: 25.00    Total pack years: 12.50    Types: Cigarettes   Smokeless tobacco: Never   Tobacco comments:    quit smoking 20+ytrs ago  Vaping Use   Vaping Use: Never used  Substance and Sexual Activity   Alcohol use: No   Drug use: No   Sexual activity: Not Currently    Birth control/protection: Surgical  Other Topics Concern   Not on file  Social History Narrative   She lives in [place]. She has lots of family in the area and is accompanied by her sister.   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   She has a distant history of smoking, quit 20 years ago.   Right handed    Social Determinants of Health   Financial Resource Strain: Low Risk  (07/18/2022)   Overall Financial Resource Strain (CARDIA)    Difficulty of Paying Living Expenses: Not hard at all  Food Insecurity: No Food Insecurity (07/18/2022)   Hunger Vital Sign    Worried About Running Out of Food in the Last Year: Never true    Ran Out of Food in the Last Year: Never true  Transportation Needs: No Transportation Needs (07/18/2022)   PRAPARE  - Transportation    Lack of Transportation (Medical): No    Lack of Transportation (Non-Medical): No  Physical Activity: Inactive (07/18/2022)   Exercise Vital Sign    Days of Exercise per Week: 0 days    Minutes of Exercise per Session: 0 min  Stress: No Stress Concern Present (07/18/2022)   [employer] - Occupational Stress Questionnaire    Feeling of Stress : Not at all  Social Connections: Not on file  Intimate Partner Violence: Not At Risk (10/22/2018)   Humiliation, Afraid, Rape, and Kick questionnaire    Fear of Current or Ex-Partner: No    Emotionally Abused: No    Physically Abused: No    Sexually Abused: No   Family History    Problem Relation Age of Onset   Allergies Father    Heart disease Father        before 60   Heart failure Father    Hypertension Father    Hyperlipidemia Father    Heart disease Mother    Diabetes Mother    Hypertension Mother    Hyperlipidemia Mother    Hypertension Sister    Heart disease Sister        before 60   Hyperlipidemia Sister    Diabetes Brother    Hypertension Brother    Diabetes Sister    Hypertension Sister    Diabetes Son    Hypertension Son    Cancer Other    Current Outpatient Medications  Medication Sig Dispense Refill   acetaminophen (TYLENOL) 500 MG tablet Take 500-1,000 mg by mouth every 4 (four) hours as needed for moderate pain.     albuterol (VENTOLIN HFA) 108 (90 Base) MCG/ACT inhaler Inhale 1-2 puffs into the lungs every 6 (six) hours as needed for wheezing or shortness of breath. 18 g 5   Alcohol Swabs (DROPSAFE ALCOHOL PREP) 70 % PADS USE TWO TIMES DAILY AS DIRECTED 200 each 6   apixaban (ELIQUIS) 5 MG TABS tablet Take 1 tablet (5 mg total) by mouth 2 (two) times daily. 60 tablet    Ascorbic Acid (VITAMIN C) 1000 MG tablet Take 1,000 mg by mouth daily.     atorvastatin (LIPITOR) 10 MG tablet TAKE 1 TABLET EVERY EVENING 90 tablet 2   B-D ULTRAFINE III SHORT PEN 31G X 8 MM MISC USE  DAILY WITH VICTOZA AND/OR NOVOLOG. 400 each 2   benzonatate (TESSALON) 100 MG capsule Take 1 capsule (100 mg total) by mouth 3 (three) times daily as needed for cough. 30 capsule 0   Blood Glucose Monitoring Suppl (TRUE METRIX METER) w/Device KIT USE AS DIRECTED 1 kit 1   carvedilol (COREG) 12.5 MG tablet TAKE 1 TABLET TWICE DAILY 180 tablet 3   dexlansoprazole (DEXILANT) 60 MG capsule Take 60 mg by mouth daily.     diclofenac sodium (VOLTAREN) 1 % GEL Apply 2 g topically 4 (four) times daily as needed (pain).     ferrous sulfate 324 MG TBEC Take 324 mg by mouth daily with breakfast.     furosemide (LASIX) 20 MG tablet TAKE 4 TABLETS (80 MG) BY MOUTH IN THE MORNING AND 3 TABLETS (60 MG) EVERY EVENING. (Patient taking differently: Take 60-80 mg by mouth See admin instructions. Take 80 mg in the morning and 60 mg in the evening) 630 tablet 3   Glucagon (GVOKE HYPOPEN 2-PACK) 0.5 MG/0.1ML SOAJ INJECT 0.5 MG INTO THE SKIN DAILY AS NEEDED. 2 mL 3   guaiFENesin-dextromethorphan (ROBITUSSIN DM) 100-10 MG/5ML syrup Take 10 mLs by mouth every 6 (six) hours as needed for cough. 118 mL 0   hydrALAZINE (APRESOLINE) 100 MG tablet Take 1 tablet (100 mg total) by mouth 3 (three) times daily. 270 tablet 3   hydrocortisone cream 1 % Apply 1 application topically daily as needed for itching.      insulin glargine, 1 Unit Dial, (TOUJEO) 300 UNIT/ML Solostar Pen Inject 20 Units into the skin at bedtime.     insulin lispro (HUMALOG KWIKPEN) 100 UNIT/ML KwikPen Inject 0.02-0.04 mLs (2-4 Units total) into the skin 3 (three) times daily as needed (high blood sugar over 150). (Patient taking differently: Inject 0-10 Units into the skin See admin instructions. Sliding scale Inject  (high blood sugar over 150).  150-199=2   units 200-249=4 units 250-299=6 units 300-349=8 units 350 above 10 units Over 400 call physician) 15 mL 2   Isopropyl Alcohol (ALCOHOL WIPES) 70 % MISC Apply 1 each topically 2 (two) times daily. 300  each 3   isosorbide mononitrate (IMDUR) 30 MG 24 hr tablet TAKE 1 TABLET EVERY DAY (Patient taking differently: Take 30 mg by mouth at bedtime.) 90 tablet 2   lamoTRIgine (LAMICTAL) 150 MG tablet Take 1 tablet (150 mg total) by mouth 2 (two) times daily. 180 tablet 3   levETIRAcetam (KEPPRA) 500 MG tablet Take 2 tablets every night 180 tablet 3   ondansetron (ZOFRAN) 4 MG tablet Take 1 tablet (4 mg total) by mouth every 8 (eight) hours as needed for nausea or vomiting. 20 tablet 1   OZEMPIC, 1 MG/DOSE, 4 MG/3ML SOPN INJECT 1 MG INTO THE SKIN ONCE A WEEK. 9 mL 3   Pancrelipase, Lip-Prot-Amyl, 25000-79000 units CPEP Take 1-2 capsules by mouth See admin instructions. Take 2 capsules in the morning with breakfast, 1 capsule with lunch, and 2 capsules with supper     potassium chloride SA (KLOR-CON M) 20 MEQ tablet Take 2 tablets (40 mEq total) by mouth daily. With additional 60meq on metolazone days 300 tablet 3   sennosides-docusate sodium (SENOKOT-S) 8.6-50 MG tablet Take 1-2 tablets by mouth at bedtime as needed for constipation.     sertraline (ZOLOFT) 50 MG tablet TAKE 1 TABLET EVERY DAY 90 tablet 2   tiZANidine (ZANAFLEX) 2 MG tablet TAKE 1 TABLET AT BEDTIME AS NEEDED 30 tablet 3   tolnaftate (TINACTIN) 1 % cream Apply 1 application topically daily as needed (foot fungus).      TRUE METRIX BLOOD GLUCOSE TEST test strip USE AS DIRECTED TO CHECK BLOOD SUGARS 2 TIMES PER DAY 200 strip 10   TRUEplus Lancets 33G MISC TEST BLOOD SUGAR TWICE DAILY AS DIRECTED 200 each 3   No current facility-administered medications for this encounter.   Wt Readings from Last 3 Encounters:  08/24/22 83.5 kg (184 lb)  08/24/22 81.2 kg (179 lb)  08/15/22 84.8 kg (187 lb)   BP 130/78   Pulse 64   Wt 83.5 kg (184 lb)   SpO2 97%   BMI 34.77 kg/m  Physical Exam General:  NAD. No resp difficulty, walked into clinic with cane HEENT: Normal Neck: Supple. No JVD. Carotids 2+ bilat; no bruits. No lymphadenopathy or  thryomegaly appreciated. Cor: PMI nondisplaced. Regular rate & rhythm. No rubs, gallops or murmurs. Lungs: Clear Abdomen: Soft, nontender, nondistended. No hepatosplenomegaly. No bruits or masses. Good bowel sounds. Extremities: No cyanosis, clubbing, rash, edema Neuro: Alert & oriented x 3, cranial nerves grossly intact. Moves all 4 extremities w/o difficulty. Affect pleasant.  Assessment/Plan: 1. Chronic diastolic CHF/RV failure: Echo in 9/20 showed EF 50-55% with moderate-severe RV dilation and mild-moderately decreased systolic function. RHC/LHC showed mild-moderate pulmonary venous hypertension and no significant coronary disease.  Cause of RV dysfunction is not clear, possibly due to diastolic CHF + OHS/OSA.  V/Q scan in 2/21 did not show acute or chronic PE.  Cardiomems was placed in 5/21, normal filling pressures with moderate pulmonary hypertension likely due to high output (no evidence for shunt lesion on RHC).  Echo in 9/21 with EF 50-55%, severe RVE with severe RV dysfunction.  Echo in 5/22 with EF 60-65%, RV function reportedly normal with normal PA pressure.  Her dyspnea is partially related to lung disease; based on last CT, she has developed mild pulmonary fibrosis since COVID,   possibly post-inflammatory.  TEE (2/24) showed EF 60-65%, RV normal. Cardiomems remains 19 mmHg yesterday which is at goal, she is not volume overloaded on exam today and weight has trended down.  NYHA class II-III.  - Continue Lasix 80 qam/60 qpm.  BMET today.  - Continue Coreg 12.5 mg bid. - She did not tolerate SGLT2-inhibitor due to recurrent yeast infections.    2. OHS/OSA: She is using CPAP but not oxygen during the day (no longer appears to need). CT chest in 10/21 with emphysema and mild fibrosis, possibly post-inflammatory after COVID-19 infection.   - Followup with pulmonary (Dr. Mannam).  3. Pulmonary hypertension: RHC in 10/20 showed mild-moderate pulmonary venous hypertension.  RHC in 5/21 showed  moderate pulmonary hypertension with low PVR and high CO, likely PH due to high output, no evidence for shunt lesion. No role for selective pulmonary vasodilators.  - On recent TEE (2/24), small secundum ASD seen with left to right flow. Arranging to RHC to further evaluate. 4. HTN: BP controlled on hydralazine, Imdur and Coreg.  5. Obesity: Body mass index is 34.77 kg/m. - She is on semaglutide, weight continues to trend down.  6. GI bleeding: Recent presumed recurrent diverticular bleed in 1/24.  Apixaban stopped transiently.  7. Atypical atrial flutter/atrial fibrillation: Atypical flutter at follow up 2/24.  HR controlled on Coreg. S/p TEE/DCCV to NSR. She remains in NSR today. - Continue Coreg.  - Continue apixaban. At minimum she will need 1 month post-DCCV of apixaban.  - Watchman would be a reasonable consideration in the future, especially if she bleeds again.   8. Fe deficiency: She has had Feraheme infusion today.  9. ? ASD: small 1.9 x 1.2 cm relatively high secundum ASD with left to right flow.  - She has RHC arranged 09/07/22.  Follow up with APP 2-3 weeks after cath.  Seydou Hearns M Mister Krahenbuhl FNP-BC 08/24/2022 

## 2022-08-24 NOTE — Patient Instructions (Signed)
Thank you for coming in today  Labs were done today, if any labs are abnormal the clinic will call you No news is good news  Your physician recommends that you schedule a follow-up appointment in:  2-3 weeks after your heart cath    Do the following things EVERYDAY: Weigh yourself in the morning before breakfast. Write it down and keep it in a log. Take your medicines as prescribed Eat low salt foods--Limit salt (sodium) to 2000 mg per day.  Stay as active as you can everyday Limit all fluids for the day to less than 2 liters  At the [facility], you and your health needs are our priority. As part of our continuing mission to provide you with exceptional heart care, we have created designated Provider Care Teams. These Care Teams include your primary Cardiologist (physician) and Advanced Practice Providers (APPs- Physician Assistants and Nurse Practitioners) who all work together to provide you with the care you need, when you need it.   You may see any of the following providers on your designated Care Team at your next follow up: Dr Glori Bickers Dr Loralie Champagne Dr. Roxana Hires, NP Lyda Jester, [place] [facility] [place], [place] Forestine Na, NP Audry Riles, PharmD   Please be sure to bring in all your medications bottles to every appointment.    Thank you for choosing [facility]  If you have any questions or concerns before your next appointment please send [place] a message through [place] or call our office at [phone].    TO LEAVE A MESSAGE FOR THE NURSE SELECT OPTION 2, PLEASE LEAVE A MESSAGE INCLUDING: YOUR NAME DATE OF BIRTH CALL BACK NUMBER REASON FOR CALL**this is important as we prioritize the call backs  YOU WILL RECEIVE A CALL BACK THE SAME DAY AS LONG AS YOU CALL BEFORE 4:00 PM

## 2022-09-03 ENCOUNTER — Other Ambulatory Visit (HOSPITAL_COMMUNITY): Payer: Self-pay

## 2022-09-03 ENCOUNTER — Other Ambulatory Visit: Payer: Self-pay | Admitting: Internal Medicine

## 2022-09-03 MED ORDER — APIXABAN 5 MG PO TABS: 5.0000 mg | ORAL_TABLET | Freq: Two times a day (BID) | ORAL | Status: DC

## 2022-09-04 ENCOUNTER — Other Ambulatory Visit (HOSPITAL_COMMUNITY): Payer: Self-pay

## 2022-09-04 MED ORDER — APIXABAN 5 MG PO TABS: 5.0000 mg | ORAL_TABLET | Freq: Two times a day (BID) | ORAL | 1 refills | Status: DC

## 2022-09-07 ENCOUNTER — Other Ambulatory Visit (HOSPITAL_COMMUNITY): Payer: Self-pay

## 2022-09-07 ENCOUNTER — Ambulatory Visit (HOSPITAL_COMMUNITY)
Admission: RE | Admit: 2022-09-07 | Discharge: 2022-09-07 | Disposition: A | Discharge status: Home / Self Care | Payer: Medicare HMO | Attending: Cardiology | Admitting: Cardiology

## 2022-09-07 ENCOUNTER — Encounter (HOSPITAL_COMMUNITY)
Admission: RE | Disposition: A | Discharge status: Home / Self Care | Payer: Self-pay | Source: Home / Self Care | Attending: Cardiology

## 2022-09-07 ENCOUNTER — Other Ambulatory Visit: Payer: Self-pay

## 2022-09-07 DIAGNOSIS — Z7901 Long term (current) use of anticoagulants: Secondary | ICD-10-CM | POA: Insufficient documentation

## 2022-09-07 DIAGNOSIS — E611 Iron deficiency: Secondary | ICD-10-CM | POA: Insufficient documentation

## 2022-09-07 DIAGNOSIS — Q211 Atrial septal defect, unspecified: Secondary | ICD-10-CM

## 2022-09-07 DIAGNOSIS — E669 Obesity, unspecified: Secondary | ICD-10-CM | POA: Insufficient documentation

## 2022-09-07 DIAGNOSIS — I5032 Chronic diastolic (congestive) heart failure: Secondary | ICD-10-CM

## 2022-09-07 DIAGNOSIS — I110 Hypertensive heart disease with heart failure: Secondary | ICD-10-CM | POA: Diagnosis not present

## 2022-09-07 DIAGNOSIS — Q2111 Secundum atrial septal defect: Secondary | ICD-10-CM | POA: Diagnosis not present

## 2022-09-07 DIAGNOSIS — Z79899 Other long term (current) drug therapy: Secondary | ICD-10-CM | POA: Insufficient documentation

## 2022-09-07 DIAGNOSIS — Z6834 Body mass index (BMI) 34.0-34.9, adult: Secondary | ICD-10-CM | POA: Diagnosis not present

## 2022-09-07 DIAGNOSIS — I4891 Unspecified atrial fibrillation: Secondary | ICD-10-CM | POA: Diagnosis not present

## 2022-09-07 DIAGNOSIS — I484 Atypical atrial flutter: Secondary | ICD-10-CM | POA: Diagnosis not present

## 2022-09-07 DIAGNOSIS — I272 Pulmonary hypertension, unspecified: Secondary | ICD-10-CM | POA: Diagnosis not present

## 2022-09-07 DIAGNOSIS — G4733 Obstructive sleep apnea (adult) (pediatric): Secondary | ICD-10-CM | POA: Diagnosis not present

## 2022-09-07 DIAGNOSIS — R0602 Shortness of breath: Secondary | ICD-10-CM

## 2022-09-07 HISTORY — PX: RIGHT HEART CATH: CATH118263

## 2022-09-07 LAB — GLUCOSE, CAPILLARY: Glucose-Capillary: 137 mg/dL — ABNORMAL HIGH (ref 70–99)

## 2022-09-07 LAB — POCT I-STAT 7, (LYTES, BLD GAS, ICA,H+H)
Acid-Base Excess: 0 mmol/L (ref 0.0–2.0)
Bicarbonate: 21.2 mmol/L (ref 20.0–28.0)
Calcium, Ion: 1.15 mmol/L (ref 1.15–1.40)
HCT: 25 % — ABNORMAL LOW (ref 36.0–46.0)
Hemoglobin: 8.5 g/dL — ABNORMAL LOW (ref 12.0–15.0)
O2 Saturation: 99 %
Potassium: 3.2 mmol/L — ABNORMAL LOW (ref 3.5–5.1)
Sodium: 140 mmol/L (ref 135–145)
TCO2: 22 mmol/L (ref 22–32)
pCO2 arterial: 21.2 mmHg — ABNORMAL LOW (ref 32–48)
pH, Arterial: 7.608 (ref 7.35–7.45)
pO2, Arterial: 116 mmHg — ABNORMAL HIGH (ref 83–108)

## 2022-09-07 LAB — POCT I-STAT EG7
Acid-Base Excess: 0 mmol/L (ref 0.0–2.0)
Acid-Base Excess: 1 mmol/L (ref 0.0–2.0)
Bicarbonate: 25.4 mmol/L (ref 20.0–28.0)
Bicarbonate: 25.3 mmol/L (ref 20.0–28.0)
Calcium, Ion: 1.2 mmol/L (ref 1.15–1.40)
Calcium, Ion: 1.17 mmol/L (ref 1.15–1.40)
HCT: 25 % — ABNORMAL LOW (ref 36.0–46.0)
HCT: 25 % — ABNORMAL LOW (ref 36.0–46.0)
Hemoglobin: 8.5 g/dL — ABNORMAL LOW (ref 12.0–15.0)
Hemoglobin: 8.5 g/dL — ABNORMAL LOW (ref 12.0–15.0)
O2 Saturation: 64 %
O2 Saturation: 86 %
Potassium: 3.2 mmol/L — ABNORMAL LOW (ref 3.5–5.1)
Potassium: 3.1 mmol/L — ABNORMAL LOW (ref 3.5–5.1)
Sodium: 140 mmol/L (ref 135–145)
Sodium: 140 mmol/L (ref 135–145)
TCO2: 27 mmol/L (ref 22–32)
TCO2: 27 mmol/L (ref 22–32)
pCO2, Ven: 42.2 mmHg — ABNORMAL LOW (ref 44–60)
pCO2, Ven: 39.3 mmHg — ABNORMAL LOW (ref 44–60)
pH, Ven: 7.388 (ref 7.25–7.43)
pH, Ven: 7.417 (ref 7.25–7.43)
pO2, Ven: 34 mmHg (ref 32–45)
pO2, Ven: 50 mmHg — ABNORMAL HIGH (ref 32–45)

## 2022-09-07 LAB — CBC
HCT: 26.5 % — ABNORMAL LOW (ref 36.0–46.0)
Hemoglobin: 8 g/dL — ABNORMAL LOW (ref 12.0–15.0)
MCH: 23 pg — ABNORMAL LOW (ref 26.0–34.0)
MCHC: 30.2 g/dL (ref 30.0–36.0)
MCV: 76.1 fL — ABNORMAL LOW (ref 80.0–100.0)
Platelets: 260 10*3/uL (ref 150–400)
RBC: 3.48 MIL/uL — ABNORMAL LOW (ref 3.87–5.11)
RDW: 21 % — ABNORMAL HIGH (ref 11.5–15.5)
WBC: 5.8 10*3/uL (ref 4.0–10.5)
nRBC: 0 % (ref 0.0–0.2)

## 2022-09-07 SURGERY — RIGHT HEART CATH
Anesthesia: LOCAL

## 2022-09-07 MED ORDER — SODIUM CHLORIDE 0.9 % IV SOLN: INTRAVENOUS | Status: DC

## 2022-09-07 MED ORDER — LIDOCAINE HCL (PF) 1 % IJ SOLN
INTRAMUSCULAR | Status: DC | PRN
  Administered 2022-09-07: 2 mL via INTRADERMAL

## 2022-09-07 MED ORDER — HEPARIN (PORCINE) IN NACL 1000-0.9 UT/500ML-% IV SOLN
INTRAVENOUS | Status: DC | PRN
  Administered 2022-09-07: 500 mL

## 2022-09-07 MED ORDER — SODIUM CHLORIDE 0.9% FLUSH: 3.0000 mL | Freq: Two times a day (BID) | INTRAVENOUS | Status: DC

## 2022-09-07 MED ORDER — SODIUM CHLORIDE 0.9% FLUSH: 3.0000 mL | INTRAVENOUS | Status: DC | PRN

## 2022-09-07 MED ORDER — LIDOCAINE HCL (PF) 1 % IJ SOLN
INTRAMUSCULAR | Status: AC
  Filled 2022-09-07: qty 30

## 2022-09-07 MED ORDER — HYDRALAZINE HCL 20 MG/ML IJ SOLN: 10.0000 mg | INTRAMUSCULAR | Status: DC | PRN

## 2022-09-07 MED ORDER — LABETALOL HCL 5 MG/ML IV SOLN: 10.0000 mg | INTRAVENOUS | Status: DC | PRN

## 2022-09-07 MED ORDER — ACETAMINOPHEN 325 MG PO TABS: 650.0000 mg | ORAL_TABLET | ORAL | Status: DC | PRN

## 2022-09-07 MED ORDER — SODIUM CHLORIDE 0.9 % IV SOLN: 250.0000 mL | INTRAVENOUS | Status: DC | PRN

## 2022-09-07 MED ORDER — ONDANSETRON HCL 4 MG/2ML IJ SOLN: 4.0000 mg | Freq: Four times a day (QID) | INTRAMUSCULAR | Status: DC | PRN

## 2022-09-07 SURGICAL SUPPLY — 8 items
CATH BALLN WEDGE 5F 110CM (CATHETERS) IMPLANT
GUIDEWIRE .025 260CM (WIRE) IMPLANT
PACK CARDIAC CATHETERIZATION (CUSTOM PROCEDURE TRAY) ×1 IMPLANT
SHEATH GLIDE SLENDER 4/5FR (SHEATH) IMPLANT
TRANSDUCER W/STOPCOCK (MISCELLANEOUS) ×1 IMPLANT
TUBING ART PRESS 72  MALE/FEM (TUBING) ×1
TUBING ART PRESS 72 MALE/FEM (TUBING) IMPLANT
WIRE MICROINTRODUCER 60CM (WIRE) IMPLANT

## 2022-09-07 NOTE — Interval H&P Note (Signed)
History and Physical Interval Note:  09/07/2022 11:02 AM  [name]  has presented today for surgery, with the diagnosis of ASD.  The various methods of treatment have been discussed with the patient and family. After consideration of risks, benefits and other options for treatment, the patient has consented to  Procedure(s): RIGHT HEART CATH (N/A) as a surgical intervention.  The patient's history has been reviewed, patient examined, no change in status, stable for surgery.  I have reviewed the patient's chart and labs.  Questions were answered to the patient's satisfaction.     [name]

## 2022-09-07 NOTE — Discharge Instructions (Signed)
Activity Rest as told by your health care provider. Return to your normal activities as told by your health care provider. Ask your health care provider what activities are safe for you. May shower and remove bandage 24 hours after discharge. If you were given a sedative during the procedure, it can affect you for several hours. Do not drive or operate machinery until your health care provider says that it is safe. General instructions      Check your IV insertion area every day for signs of infection. Check for: Redness, swelling, or pain. Fluid or blood. Warmth. Pus or a bad smell. Take over-the-counter and prescription medicines only as told by your health care provider. Contact a health care provider if: You have redness, swelling, warmth, pus, or pain around the IV insertion site. 

## 2022-09-08 ENCOUNTER — Other Ambulatory Visit: Payer: Self-pay | Admitting: Internal Medicine

## 2022-09-08 DIAGNOSIS — R053 Chronic cough: Secondary | ICD-10-CM

## 2022-09-10 ENCOUNTER — Telehealth: Payer: Self-pay

## 2022-09-10 ENCOUNTER — Encounter (HOSPITAL_COMMUNITY): Payer: Self-pay | Admitting: Cardiology

## 2022-09-10 LAB — POCT I-STAT EG7
Acid-Base Excess: 0 mmol/L (ref 0.0–2.0)
Acid-Base Excess: 1 mmol/L (ref 0.0–2.0)
Bicarbonate: 24.9 mmol/L (ref 20.0–28.0)
Bicarbonate: 25.5 mmol/L (ref 20.0–28.0)
Calcium, Ion: 1.18 mmol/L (ref 1.15–1.40)
Calcium, Ion: 1.19 mmol/L (ref 1.15–1.40)
HCT: 25 % — ABNORMAL LOW (ref 36.0–46.0)
HCT: 25 % — ABNORMAL LOW (ref 36.0–46.0)
Hemoglobin: 8.5 g/dL — ABNORMAL LOW (ref 12.0–15.0)
Hemoglobin: 8.5 g/dL — ABNORMAL LOW (ref 12.0–15.0)
O2 Saturation: 77 %
O2 Saturation: 77 %
Potassium: 3.1 mmol/L — ABNORMAL LOW (ref 3.5–5.1)
Potassium: 3.2 mmol/L — ABNORMAL LOW (ref 3.5–5.1)
Sodium: 141 mmol/L (ref 135–145)
Sodium: 141 mmol/L (ref 135–145)
TCO2: 26 mmol/L (ref 22–32)
TCO2: 27 mmol/L (ref 22–32)
pCO2, Ven: 40 mmHg — ABNORMAL LOW (ref 44–60)
pCO2, Ven: 40.4 mmHg — ABNORMAL LOW (ref 44–60)
pH, Ven: 7.402 (ref 7.25–7.43)
pH, Ven: 7.409 (ref 7.25–7.43)
pO2, Ven: 41 mmHg (ref 32–45)
pO2, Ven: 41 mmHg (ref 32–45)

## 2022-09-10 NOTE — Telephone Encounter (Signed)
Per Dr. [name], scheduled the patient for ASD consult with Dr. Burt Knack 10/08/2022. She was grateful for call and agreed with plan.

## 2022-09-10 NOTE — Telephone Encounter (Signed)
-----   Message from Sherren Mocha, MD sent at 09/10/2022  7:55 AM EDT ----- Valetta Fuller - can you set up OP consult for ASD closure for this patient with me or Arun? thx

## 2022-09-13 ENCOUNTER — Telehealth (HOSPITAL_COMMUNITY): Payer: Self-pay

## 2022-09-13 NOTE — Telephone Encounter (Signed)
I spoke with patient and let her know she can restart ozempic

## 2022-09-13 NOTE — Telephone Encounter (Signed)
Patient called and asked if she can restart her Ozempic now that testing is done.

## 2022-09-19 ENCOUNTER — Other Ambulatory Visit: Payer: Self-pay | Admitting: Internal Medicine

## 2022-09-25 DIAGNOSIS — Z794 Long term (current) use of insulin: Secondary | ICD-10-CM | POA: Diagnosis not present

## 2022-09-25 DIAGNOSIS — H31093 Other chorioretinal scars, bilateral: Secondary | ICD-10-CM | POA: Diagnosis not present

## 2022-09-25 DIAGNOSIS — Z961 Presence of intraocular lens: Secondary | ICD-10-CM | POA: Diagnosis not present

## 2022-09-25 DIAGNOSIS — H43813 Vitreous degeneration, bilateral: Secondary | ICD-10-CM | POA: Diagnosis not present

## 2022-09-25 DIAGNOSIS — E119 Type 2 diabetes mellitus without complications: Secondary | ICD-10-CM | POA: Diagnosis not present

## 2022-09-25 LAB — HM DIABETES EYE EXAM

## 2022-09-26 NOTE — Progress Notes (Signed)
PCP: [name], MD Cardiology: Dr. Herbie Baltimore HF Cardiology: Dr. Shirlee Latch  77 y.o. with history of OHS/OSA and RV dysfunction was referred by Dr. Herbie Baltimore for evaluation of CHF.  She has a history of OSA/OHS.  She uses CPAP at night but not currently using oxygen during the day.  Remote smoker.  She had an echo in 9/20 showing EF 50-55% but moderate-severe RV dysfunction with mild-moderate RV dilation.  RHC/LHC in 10/20 showed normal coronaries, pulmonary venous hypertension.  V/Q scan in 2/21 did not show evidence for acute on chronic PE.    Cardiomems was placed in 5/21.  RHC at that time showed normal filling pressures and moderate pulmonary hypertension but suspect due to high output.  No evidence for shunt lesion.  Echo showed EF 60-65%, enlarged RV with normal systolic function.   She was admitted in 5/21 with syncope, thought to be due to a seizure.    She had COVID-19 infection in 8/21. 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   Echo in 9/21 with EF 50-55%, severe RV enlargement and severely decreased RV systolic function, PASP 54 mmHg.    She was admitted in 5/22 with CHF, bronchitis.  She was diuresed and treated with antibiotics. Echo in 5/22 showed EF 60-65%, mild LVH, ?normal RV, severe LAE, ?normal PA pressure, IVC normal.  V/Q scan negative.   In 10/22, she was admitted with GI bleeding, thought to be diverticular.  BP meds were decreased and Lasix was cut back.  With increased weight, she increased her Lasix back to 80 mg daily.   She was seen in the ER in 1/24 with atypical atrial flutter, this was new.  She was started on apixaban and sent home from ER. She was seen for follow up and planned to set her up for DCCV, but she was admitted on 07/25/22 with GI bleed, thought to be diverticular.  Apixaban was held and she was tranfused. Apixaban was restarted after discharge.  She did  not have endoscopy.   Follow up 08/01/22, she remained in atypical atrial flutter. Arranged for TEE/DCCV.  TEE/DCCV showed EF 60-65%, mild LVH, RV normal, small ASD to L->R flow, no LAA thrombus. S/p DCCV --> NSR.  RHC (3/24) showed normal filling pressures, moderate pulmonary hypertension in setting of ASD, Qp/Qs 1.5/1. She was referred to Structural Heart team for ASD repair consideration.  Today she returns for post RHC HF follow up with her sister. Overall feeling fatigued. She is mildly more SOB than her baseline, she has dyspnea showering and making the bed. She has dizziness & feels balance is off, no falls. She has a chronic cough. Denies palpitations, abnormal bleeding, CP, edema, or PND/Orthopnea. Appetite ok. No fever or chills. Weight at home 177 pounds. Taking all medications. She wears CPAP.  ECG (personally reviewed): NSR with PAC, 61 bpm  Cardiomems: unable to view website today.  Labs (10/20): K 4.1, creatinine 1.15, hgb 11.4 Labs (6/21): K 3.5, creatinine 1.04, BNP 81 Labs (12/21): K 3.7, creatinine 0.79 Labs (11/22): LDL 63, BNP 33 Labs (2/23): K 3.8, creatinine 0.97 Labs (9/23): K 4.1, creatinine 0.97 Labs (1/24): K 3.5, creatinine 0.92, BNP 167 Labs (2/24): transferrin saturation 7%, hgb 8.1 Labs (3/24): K 4.0, creatinine 0.99  PMH: 1. OHS/OSA: She uses CPAP.  2. H/o CVA 3. Seizure disorder 4. Type 2 DM 5. Depression 6. HTN 7. Hyperlipidemia 8. Chronic diastolic CHF: Echo (9/20)  with EF 50-55%, moderate to severe RV dilation with mild-moderately decreased RV systolic function.  - RHC/LHC (10/20): normal coronaries; mean RA 10, PA 56/14 mean 31, mean PCWP 13, LVEDP 18, CO/CI 7.35/3.73, PVR 2.44.  - RHC/Cardiomems placement (5/21): mean RA 7, PA 58/21 mean 34, PCWP mean 12, CI 8.3 F/3.95 T, PVR 2.86 WU. No evidence for shunt lesion.  - Echo (5/21): EF 60-65%, RV enlarged with normal systolic function.  - Echo (9/21): EF 50-55%, severe RV enlargement and severely  decreased RV systolic function, PASP 54 mmHg.  - Echo (5/22): EF 60-65%, mild LVH, ?normal RV, severe LAE, ?normal PA pressure, IVC normal. - TEE (2/23): EF 60-65%, mild LVH, RV normal - RHC (3/24): RA mean 6, PA 57/12 (mean 31), PCWP mean 13, CO/CI (Fick) 17/9.4, Qp/Qs 1.5/1 9. Primarily pulmonary venous hypertension.  - V/Q scan (2/21, 5/22): No evidence for acute or chronic PE.  - PFTs normal in 5/21.  10. COVID-19 PNA 8/21.  - CT chest in 10/21 with emphysema and mild fibrosis concerning for post-infectious inflammatory fibrosis from COVID.  11. GI bleeding: 10/22, diverticular bleed.  - Presumed diverticular bleed in 1/24.  12. Atypical atrial flutter/atrial fibrillation: First noted 1/24.  - TEE/DCCV (2/24) to NSR 13. ASD: RHC (3/24) showed Qp/Qs 1.5/1, suspect hemodynamically significant ASD.  Social History   Socioeconomic History   Marital status: Divorced    Spouse name: Not on file   Number of children: Not on file   Years of education: Not on file   Highest education level: Not on file  Occupational History   Occupation: Disabled    Comment: CNA  Tobacco Use   Smoking status: Former    Packs/day: 0.50    Years: 25.00    Additional pack years: 0.00    Total pack years: 12.50    Types: Cigarettes   Smokeless tobacco: Never   Tobacco comments:    quit smoking 20+ytrs ago  Vaping Use   Vaping Use: Never used  Substance and Sexual Activity   Alcohol use: No   Drug use: No   Sexual activity: Not Currently    Birth control/protection: Surgical  Other Topics Concern   Not on file  Social History Narrative   She lives in [place]. She has lots of family in the area and is accompanied by her sister.   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   She has a distant history of smoking, quit 20 years ago.   Right handed    Social Determinants of Health   Financial Resource Strain: Low Risk  (07/18/2022)   Overall Financial Resource  Strain (CARDIA)    Difficulty of Paying Living Expenses: Not hard at all  Food Insecurity: No Food Insecurity (07/18/2022)   Hunger Vital Sign    Worried About Running Out of Food in the Last Year: Never true    Ran Out of Food in the Last Year: Never true  Transportation Needs: No Transportation Needs (07/18/2022)   PRAPARE - Administrator, Civil Service (Medical): No    Lack of Transportation (Non-Medical): No  Physical Activity: Inactive (07/18/2022)   Exercise Vital Sign    Days of Exercise per Week: 0 days    Minutes of Exercise per Session: 0 min  Stress: No Stress Concern Present (07/18/2022)   [employer] of Occupational Health - Occupational Stress Questionnaire    Feeling of Stress : Not at all  Social Connections: Not  on file  Intimate Partner Violence: Not At Risk (10/22/2018)   Humiliation, Afraid, Rape, and Kick questionnaire    Fear of Current or Ex-Partner: No    Emotionally Abused: No    Physically Abused: No    Sexually Abused: No   Family History  Problem Relation Age of Onset   Allergies Father    Heart disease Father        before 24   Heart failure Father    Hypertension Father    Hyperlipidemia Father    Heart disease Mother    Diabetes Mother    Hypertension Mother    Hyperlipidemia Mother    Hypertension Sister    Heart disease Sister        before 46   Hyperlipidemia Sister    Diabetes Brother    Hypertension Brother    Diabetes Sister    Hypertension Sister    Diabetes Son    Hypertension Son    Cancer Other    Current Outpatient Medications  Medication Sig Dispense Refill   acetaminophen (TYLENOL) 500 MG tablet Take 500-1,000 mg by mouth every 4 (four) hours as needed for moderate pain.     albuterol (VENTOLIN HFA) 108 (90 Base) MCG/ACT inhaler Inhale 1-2 puffs into the lungs every 6 (six) hours as needed for wheezing or shortness of breath. 18 g 5   apixaban (ELIQUIS) 5 MG TABS tablet Take 1 tablet (5 mg total) by mouth 2  (two) times daily. 180 tablet 1   Ascorbic Acid (VITAMIN C) 1000 MG tablet Take 1,000 mg by mouth daily.     atorvastatin (LIPITOR) 10 MG tablet TAKE 1 TABLET EVERY EVENING 90 tablet 2   B-D ULTRAFINE III SHORT PEN 31G X 8 MM MISC USE DAILY WITH VICTOZA AND/OR NOVOLOG. 400 each 2   benzonatate (TESSALON) 100 MG capsule TAKE 1 CAPSULE THREE TIMES DAILY AS NEEDED FOR COUGH 30 capsule 0   Blood Glucose Monitoring Suppl (TRUE METRIX METER) w/Device KIT USE AS DIRECTED 1 kit 1   carvedilol (COREG) 12.5 MG tablet TAKE 1 TABLET TWICE DAILY 180 tablet 3   dexlansoprazole (DEXILANT) 60 MG capsule Take 60 mg by mouth daily.     diclofenac sodium (VOLTAREN) 1 % GEL Apply 2 g topically 4 (four) times daily as needed (pain).     ferrous sulfate 324 MG TBEC Take 324 mg by mouth daily with breakfast.     furosemide (LASIX) 20 MG tablet TAKE 4 TABLETS (80 MG) BY MOUTH IN THE MORNING AND 3 TABLETS (60 MG) EVERY EVENING. (Patient taking differently: Take 60-80 mg by mouth See admin instructions. Take 80 mg in the morning and 60 mg in the evening) 630 tablet 3   Glucagon (GVOKE HYPOPEN 2-PACK) 0.5 MG/0.1ML SOAJ INJECT 0.5 MG (1 PEN) INTO THE SKIN DAILY AS NEEDED. 2 mL 11   guaiFENesin-dextromethorphan (ROBITUSSIN DM) 100-10 MG/5ML syrup Take 10 mLs by mouth every 6 (six) hours as needed for cough. 118 mL 0   hydrALAZINE (APRESOLINE) 100 MG tablet Take 1 tablet (100 mg total) by mouth 3 (three) times daily. 270 tablet 3   hydrocortisone cream 1 % Apply 1 application topically daily as needed for itching.      insulin glargine, 1 Unit Dial, (TOUJEO) 300 UNIT/ML Solostar Pen Inject 20 Units into the skin at bedtime.     insulin lispro (HUMALOG KWIKPEN) 100 UNIT/ML KwikPen Inject 0.02-0.04 mLs (2-4 Units total) into the skin 3 (three) times daily as needed (high  blood sugar over 150). (Patient taking differently: Inject 0-10 Units into the skin See admin instructions. Sliding scale Inject  (high blood sugar over 150).   150-199=2 units 200-249=4 units 250-299=6 units 300-349=8 units 350 above 10 units Over 400 call physician) 15 mL 2   Isopropyl Alcohol (ALCOHOL WIPES) 70 % MISC Apply 1 each topically 2 (two) times daily. 300 each 3   isosorbide mononitrate (IMDUR) 30 MG 24 hr tablet TAKE 1 TABLET EVERY DAY (Patient taking differently: Take 30 mg by mouth at bedtime.) 90 tablet 2   lamoTRIgine (LAMICTAL) 150 MG tablet Take 1 tablet (150 mg total) by mouth 2 (two) times daily. 180 tablet 3   levETIRAcetam (KEPPRA) 500 MG tablet Take 2 tablets every night 180 tablet 3   ondansetron (ZOFRAN) 4 MG tablet Take 1 tablet (4 mg total) by mouth every 8 (eight) hours as needed for nausea or vomiting. 20 tablet 1   OZEMPIC, 1 MG/DOSE, 4 MG/3ML SOPN INJECT 1MG  UNDER THE SKIN ONE TIME WEEKLY 9 mL 3   Pancrelipase, Lip-Prot-Amyl, 25000-79000 units CPEP Take 1-2 capsules by mouth See admin instructions. Take 2 capsules in the morning with breakfast, 1 capsule with lunch, and 2 capsules with supper     potassium chloride SA (KLOR-CON M) 20 MEQ tablet Take 2 tablets (40 mEq total) by mouth daily. With additional on metolazone days 300 tablet 3   sennosides-docusate sodium (SENOKOT-S) 8.6-50 MG tablet Take 1-2 tablets by mouth at bedtime as needed for constipation.     sertraline (ZOLOFT) 50 MG tablet TAKE 1 TABLET EVERY DAY 90 tablet 2   tiZANidine (ZANAFLEX) 2 MG tablet TAKE 1 TABLET AT BEDTIME AS NEEDED 30 tablet 3   tolnaftate (TINACTIN) 1 % cream Apply 1 application topically daily as needed (foot fungus).      TRUE METRIX BLOOD GLUCOSE TEST test strip USE AS DIRECTED TO CHECK BLOOD SUGARS 2 TIMES PER DAY 200 strip 10   TRUEplus Lancets 33G MISC TEST BLOOD SUGAR TWICE DAILY AS DIRECTED 200 each 3   Alcohol Swabs (DROPSAFE ALCOHOL PREP) 70 % PADS USE TWO TIMES DAILY AS DIRECTED 200 each 6   No current facility-administered medications for this encounter.   Wt Readings from Last 3 Encounters:  09/28/22 81.5 kg  (179 lb 9.6 oz)  09/07/22 82.1 kg (181 lb)  08/24/22 83.5 kg (184 lb)   BP 128/66   Pulse 62   Wt 81.5 kg (179 lb 9.6 oz)   SpO2 95%   BMI 33.94 kg/m  Physical Exam General:  NAD. No resp difficulty, walked into clinic with cane, fatigued-appearing. HEENT: Normal Neck: Supple. No JVD. Carotids 2+ bilat; no bruits. No lymphadenopathy or thryomegaly appreciated. Cor: PMI nondisplaced. Regular rate & rhythm. No rubs, gallops or murmurs. Lungs: Clear Abdomen: Soft, nontender, nondistended. No hepatosplenomegaly. No bruits or masses. Good bowel sounds. Extremities: No cyanosis, clubbing, rash, edema Neuro: Alert & oriented x 3, cranial nerves grossly intact. Moves all 4 extremities w/o difficulty. Affect pleasant.  Assessment/Plan: 1. Chronic diastolic CHF/RV failure: Echo in 9/20 showed EF 50-55% with moderate-severe RV dilation and mild-moderately decreased systolic function. RHC/LHC showed mild-moderate pulmonary venous hypertension and no significant coronary disease.  Cause of RV dysfunction is not clear, possibly due to diastolic CHF + OHS/OSA.  V/Q scan in 2/21 did not show acute or chronic PE.  Cardiomems was placed in 5/21, normal filling pressures with moderate pulmonary hypertension likely due to high output (no evidence for shunt lesion on  RHC).  Echo in 9/21 with EF 50-55%, severe RVE with severe RV dysfunction.  Echo in 5/22 with EF 60-65%, RV function reportedly normal with normal PA pressure.  Her dyspnea is partially related to lung disease; based on last CT, she has developed mild pulmonary fibrosis since COVID, possibly post-inflammatory.  TEE (2/24) showed EF 60-65%, RV normal. RHC (3/24) with normal filling pressures and moderate pulmonary hypertension in setting of ASD. Worse NYHA III, she is not volume overloaded on exam today and weight has trended down.  - Continue Lasix 80 qam/60 qpm.  BMET/BNP today.  - Continue Coreg 12.5 mg bid. - She did not tolerate SGLT2-inhibitor  due to recurrent yeast infections.    2. OHS/OSA: She is using CPAP but not oxygen during the day (no longer appears to need). CT chest in 10/21 with emphysema and mild fibrosis, possibly post-inflammatory after COVID-19 infection.   - Followup with pulmonary (Dr. Isaiah SergeMannam).  3. Pulmonary hypertension: RHC in 10/20 showed mild-moderate pulmonary venous hypertension.  RHC in 5/21 showed moderate pulmonary hypertension with low PVR and high CO, likely PH due to high output, no evidence for shunt lesion. No role for selective pulmonary vasodilators.  - On recent TEE (2/24), small secundum ASD seen with left to right flow. RHC (3/24) showed moderate pulmonary hypertension in setting of ASD, Qp/Qs 1.5/1 4. HTN: BP controlled on hydralazine, Imdur and Coreg.  5. Obesity: Body mass index is 33.94 kg/m. - She is on semaglutide, weight continues to trend down.  6. GI bleeding: Recent presumed recurrent diverticular bleed in 1/24.  Apixaban stopped transiently.  7. Atypical atrial flutter/atrial fibrillation: Atypical flutter at follow up 2/24.  HR controlled on Coreg. S/p TEE/DCCV to NSR. She remains in NSR today. - Continue Coreg.  - Continue apixaban. At minimum she will need 1 month post-DCCV of apixaban.  - Watchman would be a reasonable consideration in the future, especially if she bleeds again.   8. Fe deficiency: She has had Feraheme infusion 2/24, may be contributing to current symptoms. 9. ASD: small 1.9 x 1.2 cm relatively high secundum ASD with left to right flow seen on TEE. RHC (3/24) showed hemodynamically significant ASD with Qp/Qs 1.5/1. - She has been referred to the Structural Heart Team and has follow up with Dr. Excell Seltzerooper. 10. Fatigue: Likely multifactorial. Complaint with CPAP. Unclear if ASD contributing to her current symptoms. - Check CBC and TSH today.   Follow up in 3 months with Dr. Kathreen CornfieldMcLean  Joffrey Kerce [facility] Lana Flaim FNP-BC 09/28/2022

## 2022-09-28 ENCOUNTER — Ambulatory Visit (HOSPITAL_COMMUNITY)
Admission: RE | Admit: 2022-09-28 | Discharge: 2022-09-28 | Disposition: A | Discharge status: Home / Self Care | Payer: Medicare HMO | Source: Ambulatory Visit | Attending: Family Medicine | Admitting: Family Medicine

## 2022-09-28 ENCOUNTER — Encounter (HOSPITAL_COMMUNITY): Payer: Self-pay

## 2022-09-28 VITALS — BP 128/66 | HR 62 | Wt 179.6 lb

## 2022-09-28 DIAGNOSIS — I5032 Chronic diastolic (congestive) heart failure: Secondary | ICD-10-CM | POA: Diagnosis not present

## 2022-09-28 DIAGNOSIS — E662 Morbid (severe) obesity with alveolar hypoventilation: Secondary | ICD-10-CM | POA: Diagnosis not present

## 2022-09-28 DIAGNOSIS — Z8616 Personal history of COVID-19: Secondary | ICD-10-CM | POA: Insufficient documentation

## 2022-09-28 DIAGNOSIS — I10 Essential (primary) hypertension: Secondary | ICD-10-CM | POA: Diagnosis not present

## 2022-09-28 DIAGNOSIS — Z7901 Long term (current) use of anticoagulants: Secondary | ICD-10-CM | POA: Insufficient documentation

## 2022-09-28 DIAGNOSIS — Q211 Atrial septal defect, unspecified: Secondary | ICD-10-CM | POA: Diagnosis not present

## 2022-09-28 DIAGNOSIS — Z8249 Family history of ischemic heart disease and other diseases of the circulatory system: Secondary | ICD-10-CM | POA: Insufficient documentation

## 2022-09-28 DIAGNOSIS — Z8719 Personal history of other diseases of the digestive system: Secondary | ICD-10-CM

## 2022-09-28 DIAGNOSIS — Z87891 Personal history of nicotine dependence: Secondary | ICD-10-CM | POA: Diagnosis not present

## 2022-09-28 DIAGNOSIS — D649 Anemia, unspecified: Secondary | ICD-10-CM

## 2022-09-28 DIAGNOSIS — Q2111 Secundum atrial septal defect: Secondary | ICD-10-CM | POA: Insufficient documentation

## 2022-09-28 DIAGNOSIS — I4891 Unspecified atrial fibrillation: Secondary | ICD-10-CM | POA: Insufficient documentation

## 2022-09-28 DIAGNOSIS — J8410 Pulmonary fibrosis, unspecified: Secondary | ICD-10-CM | POA: Insufficient documentation

## 2022-09-28 DIAGNOSIS — Z6833 Body mass index (BMI) 33.0-33.9, adult: Secondary | ICD-10-CM | POA: Insufficient documentation

## 2022-09-28 DIAGNOSIS — Z79899 Other long term (current) drug therapy: Secondary | ICD-10-CM | POA: Insufficient documentation

## 2022-09-28 DIAGNOSIS — E611 Iron deficiency: Secondary | ICD-10-CM | POA: Insufficient documentation

## 2022-09-28 DIAGNOSIS — I272 Pulmonary hypertension, unspecified: Secondary | ICD-10-CM | POA: Insufficient documentation

## 2022-09-28 DIAGNOSIS — E669 Obesity, unspecified: Secondary | ICD-10-CM | POA: Diagnosis not present

## 2022-09-28 DIAGNOSIS — I480 Paroxysmal atrial fibrillation: Secondary | ICD-10-CM | POA: Diagnosis not present

## 2022-09-28 DIAGNOSIS — J439 Emphysema, unspecified: Secondary | ICD-10-CM | POA: Diagnosis not present

## 2022-09-28 DIAGNOSIS — I484 Atypical atrial flutter: Secondary | ICD-10-CM | POA: Insufficient documentation

## 2022-09-28 DIAGNOSIS — G4733 Obstructive sleep apnea (adult) (pediatric): Secondary | ICD-10-CM | POA: Diagnosis not present

## 2022-09-28 DIAGNOSIS — I110 Hypertensive heart disease with heart failure: Secondary | ICD-10-CM | POA: Insufficient documentation

## 2022-09-28 DIAGNOSIS — R5383 Other fatigue: Secondary | ICD-10-CM

## 2022-09-28 LAB — CBC
HCT: 23.4 % — ABNORMAL LOW (ref 36.0–46.0)
Hemoglobin: 7.1 g/dL — ABNORMAL LOW (ref 12.0–15.0)
MCH: 22 pg — ABNORMAL LOW (ref 26.0–34.0)
MCHC: 30.3 g/dL (ref 30.0–36.0)
MCV: 72.4 fL — ABNORMAL LOW (ref 80.0–100.0)
Platelets: 332 10*3/uL (ref 150–400)
RBC: 3.23 MIL/uL — ABNORMAL LOW (ref 3.87–5.11)
RDW: 18.5 % — ABNORMAL HIGH (ref 11.5–15.5)
WBC: 4 10*3/uL (ref 4.0–10.5)
nRBC: 0 % (ref 0.0–0.2)

## 2022-09-28 LAB — BASIC METABOLIC PANEL
Anion gap: 8 (ref 5–15)
BUN: 11 mg/dL (ref 8–23)
CO2: 27 mmol/L (ref 22–32)
Calcium: 8.8 mg/dL — ABNORMAL LOW (ref 8.9–10.3)
Chloride: 101 mmol/L (ref 98–111)
Creatinine, Ser: 1.05 mg/dL — ABNORMAL HIGH (ref 0.44–1.00)
GFR, Estimated: 56 mL/min — ABNORMAL LOW (ref >60)
Glucose, Bld: 143 mg/dL — ABNORMAL HIGH (ref 70–99)
Potassium: 3.7 mmol/L (ref 3.5–5.1)
Sodium: 136 mmol/L (ref 135–145)

## 2022-09-28 LAB — BRAIN NATRIURETIC PEPTIDE: B Natriuretic Peptide: 132.4 pg/mL — ABNORMAL HIGH (ref 0.0–100.0)

## 2022-09-28 NOTE — Patient Instructions (Addendum)
Thank you for coming in today  Labs were done today, if any labs are abnormal the clinic will call you No news is good news  Medications: No changes  Follow up appointments: 3 months with Dr. Earlean Shawl will receive a reminder letter in the mail a few months in advance. If you don't receive a letter, please call our office to schedule the follow-up appointment.   Your physician recommends that you schedule a follow-up appointment in:     Do the following things EVERYDAY: Weigh yourself in the morning before breakfast. Write it down and keep it in a log. Take your medicines as prescribed Eat low salt foods--Limit salt (sodium) to 2000 mg per day.  Stay as active as you can everyday Limit all fluids for the day to less than 2 liters   At the [facility], you and your health needs are our priority. As part of our continuing mission to provide you with exceptional heart care, we have created designated Provider Care Teams. These Care Teams include your primary Cardiologist (physician) and Advanced Practice Providers (APPs- Physician Assistants and Nurse Practitioners) who all work together to provide you with the care you need, when you need it.   You may see any of the following providers on your designated Care Team at your next follow up: Dr Arvilla Meres Dr Marca Ancona Dr. Marcos Eke, NP Robbie Lis, [place] [facility] [place], [place] Brynda Peon, NP Karle Plumber, PharmD   Please be sure to bring in all your medications bottles to every appointment.    Thank you for choosing [facility]  If you have any questions or concerns before your next appointment please send [place] a message through [place] or call our office at [phone].    TO LEAVE A MESSAGE FOR THE NURSE SELECT OPTION 2, PLEASE LEAVE A MESSAGE INCLUDING: YOUR NAME DATE OF BIRTH CALL BACK NUMBER REASON FOR CALL**this is  important as we prioritize the call backs  YOU WILL RECEIVE A CALL BACK THE SAME DAY AS LONG AS YOU CALL BEFORE 4:00 PM

## 2022-10-01 ENCOUNTER — Ambulatory Visit (INDEPENDENT_AMBULATORY_CARE_PROVIDER_SITE_OTHER): Payer: Medicare HMO | Admitting: Internal Medicine

## 2022-10-01 ENCOUNTER — Telehealth (HOSPITAL_COMMUNITY): Payer: Self-pay

## 2022-10-01 VITALS — BP 120/60 | HR 64 | Temp 97.9°F | Ht 61.0 in | Wt 177.6 lb

## 2022-10-01 DIAGNOSIS — I130 Hypertensive heart and chronic kidney disease with heart failure and stage 1 through stage 4 chronic kidney disease, or unspecified chronic kidney disease: Secondary | ICD-10-CM

## 2022-10-01 DIAGNOSIS — J4520 Mild intermittent asthma, uncomplicated: Secondary | ICD-10-CM

## 2022-10-01 DIAGNOSIS — D509 Iron deficiency anemia, unspecified: Secondary | ICD-10-CM

## 2022-10-01 DIAGNOSIS — N182 Chronic kidney disease, stage 2 (mild): Secondary | ICD-10-CM

## 2022-10-01 DIAGNOSIS — R053 Chronic cough: Secondary | ICD-10-CM

## 2022-10-01 DIAGNOSIS — E6609 Other obesity due to excess calories: Secondary | ICD-10-CM | POA: Diagnosis not present

## 2022-10-01 DIAGNOSIS — Z794 Long term (current) use of insulin: Secondary | ICD-10-CM | POA: Diagnosis not present

## 2022-10-01 DIAGNOSIS — E1122 Type 2 diabetes mellitus with diabetic chronic kidney disease: Secondary | ICD-10-CM

## 2022-10-01 DIAGNOSIS — I5032 Chronic diastolic (congestive) heart failure: Secondary | ICD-10-CM

## 2022-10-01 DIAGNOSIS — I110 Hypertensive heart disease with heart failure: Secondary | ICD-10-CM

## 2022-10-01 DIAGNOSIS — K5790 Diverticulosis of intestine, part unspecified, without perforation or abscess without bleeding: Secondary | ICD-10-CM

## 2022-10-01 DIAGNOSIS — Z6833 Body mass index (BMI) 33.0-33.9, adult: Secondary | ICD-10-CM

## 2022-10-01 MED ORDER — LORATADINE 10 MG PO TABS
10.0000 mg | ORAL_TABLET | Freq: Every day | ORAL | 2 refills | Status: AC
Start: 2022-10-01 — End: 2025-02-06

## 2022-10-01 MED ORDER — BENZONATATE 100 MG PO CAPS: ORAL_CAPSULE | ORAL | 1 refills | Status: DC

## 2022-10-01 NOTE — Telephone Encounter (Signed)
Patient aware of labs. She is setting up appointment with her GI and PCP to follow. She is seeing another doctor today and will have them redo her CBC.

## 2022-10-01 NOTE — Progress Notes (Unsigned)
[name],acting as a Neurosurgeon for [name] Aliment, MD.,have documented all relevant documentation on the behalf of [name] Aliment, MD,as directed by  [name] Aliment, MD while in the presence of [name] Aliment, MD.    Subjective:     Patient ID: [name] , female    DOB: Jan 08, 1946 , 77 y.o.   MRN: [id number]   Chief Complaint  Patient presents with   Diabetes   Hypertension    HPI  She is here today for a diabetes and BP check. She reports compliance with meds. Patient states her bleeding has stopped.  Patient presents with her sister [name].  BP Readings from Last 3 Encounters: 10/01/22 : 120/60 09/28/22 : 128/66 09/07/22 : 134/72    Diabetes She presents for her follow-up diabetic visit. She has type 2 diabetes mellitus. Her disease course has been improving. There are no hypoglycemic associated symptoms. Pertinent negatives for diabetes include no blurred vision, no chest pain, no polydipsia, no polyphagia and no polyuria. There are no hypoglycemic complications. Diabetic complications include nephropathy. Risk factors for coronary artery disease include diabetes mellitus, dyslipidemia, hypertension, obesity, post-menopausal and sedentary lifestyle. She is compliant with treatment some of the time. Her weight is fluctuating minimally. She is following a generally healthy diet. She never participates in exercise. Her home blood glucose trend is decreasing steadily. Her breakfast blood glucose is taken between 8-9 am. Her breakfast blood glucose range is generally 130-140 mg/dl. An ACE inhibitor/angiotensin II receptor blocker is being taken. Eye exam is not current.  Hypertension This is a chronic problem. The current episode started more than 1 year ago. The problem has been gradually improving since onset. The problem is controlled. Pertinent negatives include no blurred vision, chest pain, palpitations or shortness of breath. The current treatment provides moderate  improvement. Hypertensive end-organ damage includes kidney disease. Identifiable causes of hypertension include sleep apnea.     Past Medical History:  Diagnosis Date   Abnormal liver function     in the past.   Anemia    Arthritis    all over   Bruises easily    Cataract    right eye;immature   CHF (congestive heart failure)    Chronic back pain    stenosis   Chronic cough    Chronic kidney disease    COPD (chronic obstructive pulmonary disease)    COVID    aug/sept 2021   Demand myocardial infarction 2012   Demand Infarction in setting of Pancreatitis --> mild Troponin elevation, NON-OBSTRUCTIVE CAD   Depression    takes Abilify daily as well as Zoloft   Diastolic heart failure 2010   Grade 1 diastolic Dysfunction by Echo    Diverticulosis    DM (diabetes mellitus)    takes Victoza daily as well as Lantus and Humalog   Empty sella    on MRI in 2009.   Glaucoma    Headache(784.0)    last migraine-4-82yrs ago   History of blood transfusion    no abnormal reaction noted   History of colon polyps    benign   HTN (hypertension)    takes Benicar,Imdur,and Bystolic daily   Hyperlipidemia    takes Lipitor daily   Joint swelling    Nocturia    Obesity hypoventilation syndrome    Obstructive sleep apnea 02/2018   Notably improved split-night study with weight loss from 270 (2017) down to 250 pounds (2019).   Pancreatitis    takes Pancrelipase  daily   Parkinson's disease    takes Sinemet daily   Peripheral neuropathy    Pneumonia 2012   Seizures    takes Lamictal daily and Primidone nightly;last seizure 2wks ago   Urinary urgency    With increased frequency   Varicose veins of both lower extremities with pain    With edema.  Takes daily Lasix     Family History  Problem Relation Age of Onset   Allergies Father    Heart disease Father        before 68   Heart failure Father    Hypertension Father    Hyperlipidemia Father    Heart disease Mother    Diabetes  Mother    Hypertension Mother    Hyperlipidemia Mother    Hypertension Sister    Heart disease Sister        before 31   Hyperlipidemia Sister    Diabetes Brother    Hypertension Brother    Diabetes Sister    Hypertension Sister    Diabetes Son    Hypertension Son    Cancer Other      Current Outpatient Medications:    acetaminophen (TYLENOL) 500 MG tablet, Take 500-1,000 mg by mouth every 4 (four) hours as needed for moderate pain., Disp: , Rfl:    albuterol (VENTOLIN HFA) 108 (90 Base) MCG/ACT inhaler, Inhale 1-2 puffs into the lungs every 6 (six) hours as needed for wheezing or shortness of breath., Disp: 18 g, Rfl: 5   Alcohol Swabs (DROPSAFE ALCOHOL PREP) 70 % PADS, USE TWO TIMES DAILY AS DIRECTED, Disp: 200 each, Rfl: 6   apixaban (ELIQUIS) 5 MG TABS tablet, Take 1 tablet (5 mg total) by mouth 2 (two) times daily., Disp: 180 tablet, Rfl: 1   Ascorbic Acid (VITAMIN C) 1000 MG tablet, Take 1,000 mg by mouth daily., Disp: , Rfl:    atorvastatin (LIPITOR) 10 MG tablet, TAKE 1 TABLET EVERY EVENING, Disp: 90 tablet, Rfl: 2   B-D ULTRAFINE III SHORT PEN 31G X 8 MM MISC, USE DAILY WITH VICTOZA AND/OR NOVOLOG., Disp: 400 each, Rfl: 2   benzonatate (TESSALON) 100 MG capsule, TAKE 1 CAPSULE THREE TIMES DAILY AS NEEDED FOR COUGH, Disp: 30 capsule, Rfl: 0   Blood Glucose Monitoring Suppl (TRUE METRIX METER) w/Device KIT, USE AS DIRECTED, Disp: 1 kit, Rfl: 1   carvedilol (COREG) 12.5 MG tablet, TAKE 1 TABLET TWICE DAILY, Disp: 180 tablet, Rfl: 3   dexlansoprazole (DEXILANT) 60 MG capsule, Take 60 mg by mouth daily., Disp: , Rfl:    diclofenac sodium (VOLTAREN) 1 % GEL, Apply 2 g topically 4 (four) times daily as needed (pain)., Disp: , Rfl:    ferrous sulfate 324 MG TBEC, Take 324 mg by mouth daily with breakfast., Disp: , Rfl:    furosemide (LASIX) 20 MG tablet, TAKE 4 TABLETS (80 MG) BY MOUTH IN THE MORNING AND 3 TABLETS (60 MG) EVERY EVENING. (Patient taking differently: Take 60-80 mg by  mouth See admin instructions. Take 80 mg in the morning and 60 mg in the evening), Disp: 630 tablet, Rfl: 3   Glucagon (GVOKE HYPOPEN 2-PACK) 0.5 MG/0.1ML SOAJ, INJECT 0.5 MG (1 PEN) INTO THE SKIN DAILY AS NEEDED., Disp: 2 mL, Rfl: 11   guaiFENesin-dextromethorphan (ROBITUSSIN DM) 100-10 MG/5ML syrup, Take 10 mLs by mouth every 6 (six) hours as needed for cough., Disp: 118 mL, Rfl: 0   hydrALAZINE (APRESOLINE) 100 MG tablet, Take 1 tablet (100 mg total) by mouth 3 (  three) times daily., Disp: 270 tablet, Rfl: 3   hydrocortisone cream 1 %, Apply 1 application topically daily as needed for itching. , Disp: , Rfl:    insulin glargine, 1 Unit Dial, (TOUJEO) 300 UNIT/ML Solostar Pen, Inject 20 Units into the skin at bedtime., Disp: , Rfl:    insulin lispro (HUMALOG KWIKPEN) 100 UNIT/ML KwikPen, Inject 0.02-0.04 mLs (2-4 Units total) into the skin 3 (three) times daily as needed (high blood sugar over 150). (Patient taking differently: Inject 0-10 Units into the skin See admin instructions. Sliding scale Inject  (high blood sugar over 150).  150-199=2 units 200-249=4 units 250-299=6 units 300-349=8 units 350 above 10 units Over 400 call physician), Disp: 15 mL, Rfl: 2   Isopropyl Alcohol (ALCOHOL WIPES) 70 % MISC, Apply 1 each topically 2 (two) times daily., Disp: 300 each, Rfl: 3   isosorbide mononitrate (IMDUR) 30 MG 24 hr tablet, TAKE 1 TABLET EVERY DAY (Patient taking differently: Take 30 mg by mouth at bedtime.), Disp: 90 tablet, Rfl: 2   lamoTRIgine (LAMICTAL) 150 MG tablet, Take 1 tablet (150 mg total) by mouth 2 (two) times daily., Disp: 180 tablet, Rfl: 3   levETIRAcetam (KEPPRA) 500 MG tablet, Take 2 tablets every night, Disp: 180 tablet, Rfl: 3   ondansetron (ZOFRAN) 4 MG tablet, Take 1 tablet (4 mg total) by mouth every 8 (eight) hours as needed for nausea or vomiting., Disp: 20 tablet, Rfl: 1   OZEMPIC, 1 MG/DOSE, 4 MG/3ML SOPN, INJECT 1MG  UNDER THE SKIN ONE TIME WEEKLY, Disp: 9 mL, Rfl: 3    Pancrelipase, Lip-Prot-Amyl, 25000-79000 units CPEP, Take 1-2 capsules by mouth See admin instructions. Take 2 capsules in the morning with breakfast, 1 capsule with lunch, and 2 capsules with supper, Disp: , Rfl:    potassium chloride SA (KLOR-CON M) 20 MEQ tablet, Take 2 tablets (40 mEq total) by mouth daily. With additional on metolazone days, Disp: 300 tablet, Rfl: 3   sennosides-docusate sodium (SENOKOT-S) 8.6-50 MG tablet, Take 1-2 tablets by mouth at bedtime as needed for constipation., Disp: , Rfl:    sertraline (ZOLOFT) 50 MG tablet, TAKE 1 TABLET EVERY DAY, Disp: 90 tablet, Rfl: 2   tiZANidine (ZANAFLEX) 2 MG tablet, TAKE 1 TABLET AT BEDTIME AS NEEDED, Disp: 30 tablet, Rfl: 3   tolnaftate (TINACTIN) 1 % cream, Apply 1 application topically daily as needed (foot fungus). , Disp: , Rfl:    TRUE METRIX BLOOD GLUCOSE TEST test strip, USE AS DIRECTED TO CHECK BLOOD SUGARS 2 TIMES PER DAY, Disp: 200 strip, Rfl: 10   TRUEplus Lancets 33G MISC, TEST BLOOD SUGAR TWICE DAILY AS DIRECTED, Disp: 200 each, Rfl: 3   Allergies  Allergen Reactions   Other Anaphylaxis and Rash    Bleach   Penicillins Hives    Did it involve swelling of the face/tongue/throat, SOB, or low BP? No Did it involve sudden or severe rash/hives, skin peeling, or any reaction on the inside of your mouth or nose? Yes Did you need to seek medical attention at a hospital or doctor's office? Yes When did it last happen?      Childhood allergy  If all above answers are "NO", may proceed with cephalosporin use.     Sulfa Antibiotics Hives   Aspirin Other (See Comments)     On aspirin 81 mg - Rectal bleeding in dec 2018   Codeine     Headache and makes the patient feel "off"     Review of Systems  Constitutional: Negative.   Eyes:  Negative for blurred vision.  Respiratory: Negative.  Negative for shortness of breath.   Cardiovascular: Negative.  Negative for chest pain and palpitations.  Gastrointestinal: Negative.    Endocrine: Negative for polydipsia, polyphagia and polyuria.  Neurological: Negative.   Psychiatric/Behavioral: Negative.       Today's Vitals   10/01/22 1114  BP: 120/60  Pulse: 64  Temp: 97.9 F (36.6 C)  TempSrc: Oral  Weight: 177 lb 9.6 oz (80.6 kg)  Height: 5\' 1"  (1.549 m)  PainSc: 7   PainLoc: Ankle   Body mass index is 33.56 kg/m.  Wt Readings from Last 3 Encounters:  10/01/22 177 lb 9.6 oz (80.6 kg)  09/28/22 179 lb 9.6 oz (81.5 kg)  09/07/22 181 lb (82.1 kg)    Objective:  Physical Exam Vitals and nursing note reviewed.  Constitutional:      Appearance: Normal appearance.  HENT:     Head: Normocephalic and atraumatic.     Nose:     Comments: Masked     Mouth/Throat:     Comments: Masked  Eyes:     Extraocular Movements: Extraocular movements intact.  Cardiovascular:     Rate and Rhythm: Normal rate and regular rhythm.     Heart sounds: Normal heart sounds.  Pulmonary:     Effort: Pulmonary effort is normal.     Breath sounds: Normal breath sounds.  Musculoskeletal:     Cervical back: Normal range of motion.  Skin:    General: Skin is warm.  Neurological:     General: No focal deficit present.     Mental Status: She is alert.  Psychiatric:        Mood and Affect: Mood normal.        Behavior: Behavior normal.      Assessment And Plan:     1. Hypertensive heart and renal disease with renal failure, stage 1 through stage 4 or unspecified chronic kidney disease, with heart failure  2. Type 2 diabetes mellitus with stage 2 chronic kidney disease, with long-term current use of insulin  3. Hypertensive heart disease with chronic diastolic congestive heart failure  4. Simple chronic bronchitis  5. Class 1 obesity due to excess calories with serious comorbidity and body mass index (BMI) of 33.0 to 33.9 in adult   Patient was given opportunity to ask questions. Patient verbalized understanding of the plan and was able to repeat key elements of the  plan. All questions were answered to their satisfaction.   I, [name] Alimentobyn N Dawana Asper, MD, have reviewed all documentation for this visit. The documentation on 10/01/22 for the exam, diagnosis, procedures, and orders are all accurate and complete.   IF YOU HAVE BEEN REFERRED TO A SPECIALIST, IT MAY TAKE 1-2 WEEKS TO SCHEDULE/PROCESS THE REFERRAL. IF YOU HAVE NOT HEARD FROM US/SPECIALIST IN TWO WEEKS, PLEASE GIVE US A CALL AT [phone].   THE PATIENT IS ENCOURAGED TO PRACTICE SOCIAL DISTANCING DUE TO THE COVID-19 PANDEMIC.

## 2022-10-01 NOTE — Patient Instructions (Signed)

## 2022-10-02 LAB — CBC
Hematocrit: 24.1 % — ABNORMAL LOW (ref 34.0–46.6)
Hemoglobin: 7 g/dL — CL (ref 11.1–15.9)
MCH: 20.8 pg — ABNORMAL LOW (ref 26.6–33.0)
MCHC: 29 g/dL — ABNORMAL LOW (ref 31.5–35.7)
MCV: 72 fL — ABNORMAL LOW (ref 79–97)
Platelets: 348 10*3/uL (ref 150–450)
RBC: 3.36 x10E6/uL — ABNORMAL LOW (ref 3.77–5.28)
RDW: 17.3 % — ABNORMAL HIGH (ref 11.7–15.4)
WBC: 4.2 10*3/uL (ref 3.4–10.8)

## 2022-10-02 LAB — IRON,TIBC AND FERRITIN PANEL
Ferritin: 20 ng/mL (ref 15–150)
Iron Saturation: 4 % — CL (ref 15–55)
Iron: 14 ug/dL — ABNORMAL LOW (ref 27–139)
Total Iron Binding Capacity: 361 ug/dL (ref 250–450)
UIBC: 347 ug/dL (ref 118–369)

## 2022-10-02 LAB — MICROALBUMIN / CREATININE URINE RATIO
Creatinine, Urine: 17 mg/dL
Microalb/Creat Ratio: <18 mg/g creat (ref 0–29)
Microalbumin, Urine: <3 ug/mL

## 2022-10-02 LAB — HEMOGLOBIN A1C
Est. average glucose Bld gHb Est-mCnc: 157 mg/dL
Hgb A1c MFr Bld: 7.1 % — ABNORMAL HIGH (ref 4.8–5.6)

## 2022-10-03 ENCOUNTER — Telehealth: Payer: Self-pay | Admitting: Physician Assistant

## 2022-10-03 ENCOUNTER — Other Ambulatory Visit (HOSPITAL_COMMUNITY): Payer: Self-pay | Admitting: Family Medicine

## 2022-10-03 ENCOUNTER — Encounter: Payer: Self-pay | Admitting: Internal Medicine

## 2022-10-03 DIAGNOSIS — D649 Anemia, unspecified: Secondary | ICD-10-CM

## 2022-10-03 MED ORDER — SODIUM CHLORIDE 0.9% IV SOLUTION
Freq: Once | INTRAVENOUS | Status: AC
Start: 2022-10-03 — End: ?

## 2022-10-03 NOTE — Telephone Encounter (Signed)
scheduled per 4/10 referral , pt has been called and confirmed date and time. Pt is aware of location and to arrive early for check in   

## 2022-10-03 NOTE — Addendum Note (Signed)
Encounter addended by: Jacklynn Ganong, FNP on: 10/03/2022 1:39 PM  Actions taken: Visit diagnoses modified, Problem List modified, Order list changed, Diagnosis association updated

## 2022-10-04 ENCOUNTER — Other Ambulatory Visit: Payer: Self-pay

## 2022-10-04 ENCOUNTER — Telehealth: Payer: Self-pay

## 2022-10-04 MED ORDER — TRUE METRIX METER W/DEVICE KIT: PACK | 1 refills | Status: DC

## 2022-10-05 ENCOUNTER — Ambulatory Visit (INDEPENDENT_AMBULATORY_CARE_PROVIDER_SITE_OTHER): Payer: Medicare HMO

## 2022-10-05 ENCOUNTER — Other Ambulatory Visit (HOSPITAL_COMMUNITY): Payer: Self-pay | Admitting: Family Medicine

## 2022-10-05 ENCOUNTER — Ambulatory Visit: Payer: Medicare HMO | Admitting: Pulmonary Disease

## 2022-10-05 ENCOUNTER — Encounter: Payer: Self-pay | Admitting: Pulmonary Disease

## 2022-10-05 VITALS — BP 118/72 | HR 57 | Ht 61.0 in | Wt 176.0 lb

## 2022-10-05 DIAGNOSIS — G4733 Obstructive sleep apnea (adult) (pediatric): Secondary | ICD-10-CM | POA: Diagnosis not present

## 2022-10-05 DIAGNOSIS — R0602 Shortness of breath: Secondary | ICD-10-CM | POA: Diagnosis not present

## 2022-10-05 DIAGNOSIS — R0609 Other forms of dyspnea: Secondary | ICD-10-CM

## 2022-10-05 LAB — PREPARE RBC (CROSSMATCH)

## 2022-10-05 NOTE — Progress Notes (Signed)
[name]    [id number]    [birth year]  Primary Care Physician:Sanders, Melina Schools, MD  Referring Physician: [name], MD [address] [place],  [place] [address]  Chief complaint: Follow-up for post COVID-19  HPI: 77 y.o.  with OSA.  Developed COVID-19 at the end of August.  Treated with monoclonal antibody on 02/23/2020. She was treated at home with remote health with dexamethasone, azithromycin, supplemental oxygen. She had a rough first week but feels that she is slowly improving.  Continues to have cough with white mucus  Supplemental oxygen stopped in December 2021 due to continued improvement  Hospitalized from 5/22 to 11/18/2020 for acute respiratory failure secondary to bronchitis. She had a CT of the chest which showed   She was evaluated in the ED in July 2023 for unresponsiveness, code stroke.  MRI, CT head and CTA of head and neck were unremarkable.  There is no evidence of seizures on EEG.  Chest x-ray during that admission showed mild cardiomegaly with no acute lung abnormalities.  chronic changes and no acute infiltrate.  She was treated with antibiotics, steroids.  Ultimately treated for heart failure with Lasix.  Pets: Has dogs, no cats, birds, farm animals Occupation: Retired Agricultural engineer Exposures: No known exposures.  No mold, hot tub, Jacuzzi Smoking history: 13-pack-year smoker.  Quit in 1990 Travel history: Previously lived in [place].  No significant recent travel Relevant family history: No significant family history of lung disease  Interim history: Taken off Breo inhaler in 2022.  She had been doing well off that However for the past few months reports increasing cough, wheezing, occasional mucus production which is more at night.  Outpatient Encounter Medications as of 10/05/2022  Medication Sig   acetaminophen (TYLENOL) 500 MG tablet Take 500-1,000 mg by mouth every 4 (four) hours as needed for moderate pain.    albuterol (VENTOLIN HFA) 108 (90 Base) MCG/ACT inhaler Inhale 1-2 puffs into the lungs every 6 (six) hours as needed for wheezing or shortness of breath.   Alcohol Swabs (DROPSAFE ALCOHOL PREP) 70 % PADS USE TWO TIMES DAILY AS DIRECTED   apixaban (ELIQUIS) 5 MG TABS tablet Take 1 tablet (5 mg total) by mouth 2 (two) times daily.   Ascorbic Acid (VITAMIN C) 1000 MG tablet Take 1,000 mg by mouth daily.   atorvastatin (LIPITOR) 10 MG tablet TAKE 1 TABLET EVERY EVENING   B-D ULTRAFINE III SHORT PEN 31G X 8 MM MISC USE DAILY WITH VICTOZA AND/OR NOVOLOG.   benzonatate (TESSALON) 100 MG capsule TAKE 1 CAPSULE THREE TIMES DAILY AS NEEDED FOR COUGH   Blood Glucose Monitoring Suppl (TRUE METRIX METER) w/Device KIT USE AS DIRECTED   carvedilol (COREG) 12.5 MG tablet TAKE 1 TABLET TWICE DAILY   dexlansoprazole (DEXILANT) 60 MG capsule Take 60 mg by mouth daily.   diclofenac sodium (VOLTAREN) 1 % GEL Apply 2 g topically 4 (four) times daily as needed (pain).   ferrous sulfate 324 MG TBEC Take 324 mg by mouth daily with breakfast.   furosemide (LASIX) 20 MG tablet TAKE 4 TABLETS (80 MG) BY MOUTH IN THE MORNING AND 3 TABLETS (60 MG) EVERY EVENING. (Patient taking differently: Take 60-80 mg by mouth See admin instructions. Take 80 mg in the morning and 60 mg in the evening)   Glucagon (GVOKE HYPOPEN 2-PACK) 0.5 MG/0.1ML SOAJ INJECT 0.5 MG (1 PEN) INTO THE SKIN DAILY AS NEEDED.   guaiFENesin-dextromethorphan (ROBITUSSIN DM) 100-10 MG/5ML syrup  Take 10 mLs by mouth every 6 (six) hours as needed for cough.   hydrALAZINE (APRESOLINE) 100 MG tablet Take 1 tablet (100 mg total) by mouth 3 (three) times daily.   hydrocortisone cream 1 % Apply 1 application topically daily as needed for itching.    insulin glargine, 1 Unit Dial, (TOUJEO) 300 UNIT/ML Solostar Pen Inject 20 Units into the skin at bedtime.   insulin lispro (HUMALOG KWIKPEN) 100 UNIT/ML KwikPen Inject 0.02-0.04 mLs (2-4 Units total) into the skin 3 (three)  times daily as needed (high blood sugar over 150). (Patient taking differently: Inject 0-10 Units into the skin See admin instructions. Sliding scale Inject  (high blood sugar over 150).  150-199=2 units 200-249=4 units 250-299=6 units 300-349=8 units 350 above 10 units Over 400 call physician)   Isopropyl Alcohol (ALCOHOL WIPES) 70 % MISC Apply 1 each topically 2 (two) times daily.   isosorbide mononitrate (IMDUR) 30 MG 24 hr tablet TAKE 1 TABLET EVERY DAY (Patient taking differently: Take 30 mg by mouth at bedtime.)   lamoTRIgine (LAMICTAL) 150 MG tablet Take 1 tablet (150 mg total) by mouth 2 (two) times daily.   levETIRAcetam (KEPPRA) 500 MG tablet Take 2 tablets every night   loratadine (CLARITIN) 10 MG tablet Take 1 tablet (10 mg total) by mouth daily.   ondansetron (ZOFRAN) 4 MG tablet Take 1 tablet (4 mg total) by mouth every 8 (eight) hours as needed for nausea or vomiting.   OZEMPIC, 1 MG/DOSE, 4 MG/3ML SOPN INJECT 1MG  UNDER THE SKIN ONE TIME WEEKLY   Pancrelipase, Lip-Prot-Amyl, 25000-79000 units CPEP Take 1-2 capsules by mouth See admin instructions. Take 2 capsules in the morning with breakfast, 1 capsule with lunch, and 2 capsules with supper   potassium chloride SA (KLOR-CON M) 20 MEQ tablet Take 2 tablets (40 mEq total) by mouth daily. With additional on metolazone days   sennosides-docusate sodium (SENOKOT-S) 8.6-50 MG tablet Take 1-2 tablets by mouth at bedtime as needed for constipation.   sertraline (ZOLOFT) 50 MG tablet TAKE 1 TABLET EVERY DAY   tiZANidine (ZANAFLEX) 2 MG tablet TAKE 1 TABLET AT BEDTIME AS NEEDED   tolnaftate (TINACTIN) 1 % cream Apply 1 application topically daily as needed (foot fungus).    TRUE METRIX BLOOD GLUCOSE TEST test strip USE AS DIRECTED TO CHECK BLOOD SUGARS 2 TIMES PER DAY   TRUEplus Lancets 33G MISC TEST BLOOD SUGAR TWICE DAILY AS DIRECTED   Facility-Administered Encounter Medications as of 10/05/2022  Medication   0.9 %  sodium  chloride infusion (Manually program via Guardrails IV Fluids)   0.9 %  sodium chloride infusion (Manually program via Guardrails IV Fluids)   Physical Exam: Blood pressure 118/72, pulse (!) 57, height 5\' 1"  (1.549 m), weight 176 lb (79.8 kg), SpO2 97 %. Gen:      No acute distress HEENT:  EOMI, sclera anicteric Neck:     No masses; no thyromegaly Lungs:    Clear to auscultation bilaterally; normal respiratory effort CV:         Regular rate and rhythm; no murmurs Abd:      + bowel sounds; soft, non-tender; no palpable masses, no distension Ext:    No edema; adequate peripheral perfusion Skin:      Warm and dry; no rash Neuro: alert and oriented x 3 Psych: normal mood and affect   Data Reviewed: Imaging: Chest x-ray 08/01/2019-low lung volumes with bibasal atelectasis, borderline cardiomegaly.  High-res CT 03/29/2020-scattered areas of peripheral groundglass opacity,  8 mm right upper lobe nodule CT chest 11/17/2020-subsegmental atelectasis at the base Chest x-ray 12/28/2021-cardiomegaly, no acute airspace disease. I have reviewed the images personally.  PFTs: 10/26/2019 FVC 1.52 [75%], FEV1 1.29 [83%], F/F 85, TLC 3.77 [81%], DLCO 16.54 [94%] Normal test  Labs: CBC 04/10/2021-WBC 5.3, eos 3%, absolute eosinophil count 159  Assessment:  Asthma Has presumptive asthma as she has recurrent bronchitis and sensitivity to strong perfumes though there is no obstruction on PFTs Currently off controller medication but notes worsening respiratory symptoms. Will get chest x-ray today.  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   She is not very compliant with the CPAP and we reviewed risks of untreated sleep apnea today.  She will try to be more compliant  Pulm HTN.  Follows with Dr. Shirlee Latch  Plan/Recommendations: Resume Breo, chest x-ray CPAP for sleep apnea Follow-up in 6 months  Chilton Greathouse MD  Pulmonary and  Critical Care 10/05/2022, 2:35 PM  CC: [name], MD

## 2022-10-05 NOTE — Addendum Note (Signed)
Addended by: Maurene Capes on: 10/05/2022 02:49 PM   Modules accepted: Orders

## 2022-10-05 NOTE — Telephone Encounter (Signed)
Called to check on patient. She reports she is doing fine. Confirmed with patient if supplements for iron were taken today & yesterday. She reports taking iron supplement. Patient reports no worsening SOB. Patient assured, if SOB worsens to visit local ED.

## 2022-10-05 NOTE — Patient Instructions (Signed)
Will get a chest x-ray today Resume Breo 200 Follow-up in 6 months

## 2022-10-08 ENCOUNTER — Ambulatory Visit: Payer: Medicare HMO | Attending: Cardiovascular Disease | Admitting: Cardiovascular Disease

## 2022-10-08 ENCOUNTER — Encounter: Payer: Self-pay | Admitting: Cardiovascular Disease

## 2022-10-08 VITALS — BP 110/50 | HR 70 | Ht 61.0 in | Wt 181.4 lb

## 2022-10-08 DIAGNOSIS — I272 Pulmonary hypertension, unspecified: Secondary | ICD-10-CM

## 2022-10-08 DIAGNOSIS — Q211 Atrial septal defect, unspecified: Secondary | ICD-10-CM | POA: Diagnosis not present

## 2022-10-08 DIAGNOSIS — I5032 Chronic diastolic (congestive) heart failure: Secondary | ICD-10-CM | POA: Diagnosis not present

## 2022-10-08 NOTE — Progress Notes (Signed)
Cardiology Office Note:    Date:  10/10/2022   ID:  [name], DOB 03/13/1946, MRN [id number]  PCP:  [name], MD   [facility] Providers Cardiologist:  Bryan Lemma, MD     Referring MD: [name], MD   Chief Complaint  Patient presents with   Atrial Septal Defect    History of Present Illness:    [name] is a 77 y.o. female referred by Dr. Shirlee Latch for evaluation of atrial septal defect.  The patient has a history of pulmonary hypertension felt to be multifactorial with obstructive sleep apnea and high output.  In 2021 she underwent right heart catheterization but there is no evidence of a shunt lesion at that time.  She has been evaluated in the past with cardiac catheterization demonstrating angiographically normal coronary arteries.  She has had no evidence of chronic pulmonary thromboembolic disease.  The patient has also had issues with lower GI bleeding and chronic anemia.  In February 2024 she was diagnosed with atypical atrial flutter and set up for TEE cardioversion.  Her transesophageal echo demonstrated an atrial septal defect with continuous left-to-right flow.  She underwent cardioversion at that time and remains on apixaban.  She then underwent right heart catheterization to evaluate for hemodynamic significance of her atrial septal defect.  This demonstrated a mean RA pressure of 6, pulmonary pressure 57/12 with a mean of 31, and a pulmonary capillary wedge pressure of 13.  There was a significant step up from the SVC to the PA with oxygen saturations of 64% and 77%, respectively.  The patient's QP/QS is 1.5/1.  From a symptomatic perspective, she complains of fatigue and shortness of breath with activity.  She has had no chest pain or pressure.  No leg edema, orthopnea, or PND.  Past Medical History:  Diagnosis Date   Abnormal liver function     in the past.   Anemia    Arthritis    all over   Bruises easily    Cataract    right  eye;immature   CHF (congestive heart failure)    Chronic back pain    stenosis   Chronic cough    Chronic kidney disease    COPD (chronic obstructive pulmonary disease)    COVID    aug/sept 2021   Demand myocardial infarction 2012   Demand Infarction in setting of Pancreatitis --> mild Troponin elevation, NON-OBSTRUCTIVE CAD   Depression    takes Abilify daily as well as Zoloft   Diastolic heart failure 2010   Grade 1 diastolic Dysfunction by Echo    Diverticulosis    DM (diabetes mellitus)    takes Victoza daily as well as Lantus and Humalog   Empty sella    on MRI in 2009.   Glaucoma    Headache(784.0)    last migraine-4-59yrs ago   History of blood transfusion    no abnormal reaction noted   History of colon polyps    benign   HTN (hypertension)    takes Benicar,Imdur,and Bystolic daily   Hyperlipidemia    takes Lipitor daily   Joint swelling    Nocturia    Obesity hypoventilation syndrome    Obstructive sleep apnea 02/2018   Notably improved split-night study with weight loss from 270 (2017) down to 250 pounds (2019).   Pancreatitis    takes Pancrelipase daily   Parkinson's disease    takes Sinemet daily   Peripheral neuropathy    Pneumonia 2012  Seizures    takes Lamictal daily and Primidone nightly;last seizure 2wks ago   Urinary urgency    With increased frequency   Varicose veins of both lower extremities with pain    With edema.  Takes daily Lasix    Past Surgical History:  Procedure Laterality Date   ABDOMINAL HYSTERECTOMY     APPENDECTOMY     BACK SURGERY     Cardiac Event Monitor  September-October 2017   Sinus rhythm with occasional PACs and artifact. No arrhythmias besides one short run of tachycardia.   CARDIOVERSION N/A 08/15/2022   Procedure: CARDIOVERSION;  Surgeon: Laurey Morale, MD;  Location: [facility] ENDOSCOPY;  Service: Cardiovascular;  Laterality: N/A;   CHOLECYSTECTOMY     COLONOSCOPY N/A 08/15/2012   Procedure: COLONOSCOPY;  Surgeon:  Willis Modena, MD;  Location: [facility] ENDOSCOPY;  Service: Endoscopy;  Laterality: N/A;   COLONOSCOPY WITH PROPOFOL Left 06/17/2017   Procedure: COLONOSCOPY WITH PROPOFOL;  Surgeon: Kerin Salen, MD;  Location: [facility] ENDOSCOPY;  Service: Gastroenterology;  Laterality: Left;   COLONOSCOPY WITH PROPOFOL N/A 04/03/2021   Procedure: COLONOSCOPY WITH PROPOFOL;  Surgeon: Vida Rigger, MD;  Location: [facility] ENDOSCOPY;  Service: Endoscopy;  Laterality: N/A;   CORONARY CALCIUM SCORE AND CTA  04/2018   Coronary calcium score 71.9.  Very large, hyperdynamic LAD wrapping apex giving rise to PDA.  Moderate LAD-diagonal and circumflex plaque -> CT FFR suggested positive findings in distal D1, D2 and distal circumflex.  Referred for cath. ==> FALSE POSITIVE   ESOPHAGOGASTRODUODENOSCOPY     eye cysts Bilateral    LASIK     LEFT HEART CATH AND CORONARY ANGIOGRAPHY N/A 05/16/2018   Procedure: LEFT HEART CATH AND CORONARY ANGIOGRAPHY;  Surgeon: Lyn Records, MD;  Location: [facility] INVASIVE CV LAB:  Angiographically normal coronary arteries with LVEDP of 21 mmHg.  Large draping hyperdominant LAD that wraps the apex and provides distal half of the PDA.  Relatively small caliber distal Cx -->proxLPDA, non-dom RCA. No Cx or Diag lesions -- FALSE + CT FFR   LEFT HEART CATH AND CORONARY ANGIOGRAPHY  2009/March 2012   2009: (Dr. Janene Harvey) Nonobstructive CAD; 2012: Minimal CAD --> false positive stress test   NM MYOVIEW LTD  01/2016; 01/2018   a)LOW RISK. No ischemia or infarction;; b) EF 55-60%. NO ST changes.  No ischemia or Infarction. NO significant RV enlargement.  LOW RISK.   POLYPECTOMY  04/03/2021   Procedure: POLYPECTOMY;  Surgeon: Vida Rigger, MD;  Location: [facility] ENDOSCOPY;  Service: Endoscopy;;   Polysomnogram  02/2018   (Dr. Vickey Huger from neurology): Split study was not adequately done due to low AHI.  She slept reclined and had an AHI less than 10, prolonged hypoxemia and no hypercapnia. -->  She has home oxygen already  prescribed, but did not meet criteria for nightly oxygen.  (Was noted that the patient did not cooperate well with study.  Titration was discussed.   PRESSURE SENSOR/CARDIOMEMS N/A 11/09/2019   Procedure: PRESSURE SENSOR/CARDIOMEMS;  Surgeon: Laurey Morale, MD;  Location: [facility] INVASIVE CV LAB;  Service: Cardiovascular;  Laterality: N/A;   RECTAL POLYPECTOMY     RIGHT HEART CATH N/A 09/07/2022   Procedure: RIGHT HEART CATH;  Surgeon: Laurey Morale, MD;  Location: [facility] INVASIVE CV LAB;  Service: Cardiovascular;  Laterality: N/A;   RIGHT/LEFT HEART CATH AND CORONARY ANGIOGRAPHY N/A 04/06/2019   Procedure: RIGHT/LEFT HEART CATH AND CORONARY ANGIOGRAPHY;  Surgeon: Marykay Lex, MD;  Location: [facility] INVASIVE CV LAB; angiographically normal coronary  arteries.  Moderately elevated AD LVEDP..  Mild pulmonary hypertension: PA P 56/14 mmHg-mean 31 mmHg.  RAP 10 mmHg.  RV P-EDP 54/4 mmHg - 12 mmHg.  PCWP 11-15 mmHg.  CO-CI: 7.35-3.73.   TEE WITHOUT CARDIOVERSION N/A 08/15/2022   Procedure: TRANSESOPHAGEAL ECHOCARDIOGRAM (TEE);  Surgeon: Laurey Morale, MD;  Location: [facility] ENDOSCOPY;  Service: Cardiovascular;  Laterality: N/A;   TONSILLECTOMY     TRANSTHORACIC ECHOCARDIOGRAM  01/2016; 05/2017   A) Normal LV chamber size with mod LVH pattern. EF 50-55%. Severe LA dilation. Mod RA dilation. PAP elevated at 38 mmHg;; B) Mild concentric LVH.  EF 55-60% & no RWMA.  Grade 2 DD.  Diastolic flattening of the ventricular septum == ? elevated PAP.  Mild aortic valve calcification/sclerosis.  Mod LA dilation.  Mild RV dilation & Mod RA dilation   TRANSTHORACIC ECHOCARDIOGRAM  10/2017; 01/2018   a) EF 55-60%.  No RWMA,  Moderate LA dilation.  Mild LA dilation.  Peak PA pressures ~57 mmHg (moderate);;; b) EF 55-60%.  GRII DD.  Suggestion of RV volume overload with moderate RV dilation.  Severe LA and RA dilation.Marland Kitchen  PA pressure ~67%.   TRANSTHORACIC ECHOCARDIOGRAM  01/2019   Normal LV size and function.  EF 50 to 55%.  Impaired  relaxation.  RV appears to have moderately reduced function.  Severely enlarged.  Cannot fully assess RV pressures.  Hypermobile interatrial septum.  Right atrium severely dilated.   TUBAL LIGATION     vaginal cyst removed     several times    Current Medications: Current Meds  Medication Sig   acetaminophen (TYLENOL) 500 MG tablet Take 500-1,000 mg by mouth every 4 (four) hours as needed for moderate pain.   albuterol (VENTOLIN HFA) 108 (90 Base) MCG/ACT inhaler Inhale 1-2 puffs into the lungs every 6 (six) hours as needed for wheezing or shortness of breath.   Alcohol Swabs (DROPSAFE ALCOHOL PREP) 70 % PADS USE TWO TIMES DAILY AS DIRECTED   apixaban (ELIQUIS) 5 MG TABS tablet Take 1 tablet (5 mg total) by mouth 2 (two) times daily.   ARIPiprazole (ABILIFY) 10 MG tablet Take 10 mg by mouth daily.   Ascorbic Acid (VITAMIN C) 1000 MG tablet Take 1,000 mg by mouth daily.   atorvastatin (LIPITOR) 10 MG tablet TAKE 1 TABLET EVERY EVENING   B-D ULTRAFINE III SHORT PEN 31G X 8 MM MISC USE DAILY WITH VICTOZA AND/OR NOVOLOG.   benzonatate (TESSALON) 100 MG capsule TAKE 1 CAPSULE THREE TIMES DAILY AS NEEDED FOR COUGH   Blood Glucose Monitoring Suppl (TRUE METRIX METER) w/Device KIT USE AS DIRECTED   carvedilol (COREG) 12.5 MG tablet TAKE 1 TABLET TWICE DAILY   dexlansoprazole (DEXILANT) 60 MG capsule Take 60 mg by mouth daily.   diclofenac sodium (VOLTAREN) 1 % GEL Apply 2 g topically 4 (four) times daily as needed (pain).   ferrous sulfate 324 MG TBEC Take 324 mg by mouth daily with breakfast.   furosemide (LASIX) 20 MG tablet TAKE 4 TABLETS (80 MG) BY MOUTH IN THE MORNING AND 3 TABLETS (60 MG) EVERY EVENING. (Patient taking differently: Take 60-80 mg by mouth See admin instructions. Take 80 mg in the morning and 60 mg in the evening)   Glucagon (GVOKE HYPOPEN 2-PACK) 0.5 MG/0.1ML SOAJ INJECT 0.5 MG (1 PEN) INTO THE SKIN DAILY AS NEEDED.   guaiFENesin-dextromethorphan (ROBITUSSIN DM) 100-10 MG/5ML  syrup Take 10 mLs by mouth every 6 (six) hours as needed for cough.   hydrALAZINE (APRESOLINE) 100  MG tablet Take 1 tablet (100 mg total) by mouth 3 (three) times daily.   hydrocortisone cream 1 % Apply 1 application topically daily as needed for itching.    insulin glargine, 1 Unit Dial, (TOUJEO) 300 UNIT/ML Solostar Pen Inject 20 Units into the skin at bedtime.   insulin lispro (HUMALOG KWIKPEN) 100 UNIT/ML KwikPen Inject 0.02-0.04 mLs (2-4 Units total) into the skin 3 (three) times daily as needed (high blood sugar over 150). (Patient taking differently: Inject 0-10 Units into the skin See admin instructions. Sliding scale Inject  (high blood sugar over 150).  150-199=2 units 200-249=4 units 250-299=6 units 300-349=8 units 350 above 10 units Over 400 call physician)   Isopropyl Alcohol (ALCOHOL WIPES) 70 % MISC Apply 1 each topically 2 (two) times daily.   isosorbide mononitrate (IMDUR) 30 MG 24 hr tablet TAKE 1 TABLET EVERY DAY (Patient taking differently: Take 30 mg by mouth at bedtime.)   lamoTRIgine (LAMICTAL) 150 MG tablet Take 1 tablet (150 mg total) by mouth 2 (two) times daily.   levETIRAcetam (KEPPRA) 500 MG tablet Take 2 tablets every night   loratadine (CLARITIN) 10 MG tablet Take 1 tablet (10 mg total) by mouth daily.   ondansetron (ZOFRAN) 4 MG tablet Take 1 tablet (4 mg total) by mouth every 8 (eight) hours as needed for nausea or vomiting.   OZEMPIC, 1 MG/DOSE, 4 MG/3ML SOPN INJECT 1MG  UNDER THE SKIN ONE TIME WEEKLY   Pancrelipase, Lip-Prot-Amyl, 25000-79000 units CPEP Take 1-2 capsules by mouth See admin instructions. Take 2 capsules in the morning with breakfast, 1 capsule with lunch, and 2 capsules with supper   potassium chloride SA (KLOR-CON M) 20 MEQ tablet Take 2 tablets (40 mEq total) by mouth daily. With additional on metolazone days   sennosides-docusate sodium (SENOKOT-S) 8.6-50 MG tablet Take 1-2 tablets by mouth at bedtime as needed for constipation.    sertraline (ZOLOFT) 50 MG tablet TAKE 1 TABLET EVERY DAY   tiZANidine (ZANAFLEX) 2 MG tablet TAKE 1 TABLET AT BEDTIME AS NEEDED   tolnaftate (TINACTIN) 1 % cream Apply 1 application topically daily as needed (foot fungus).    TRUE METRIX BLOOD GLUCOSE TEST test strip USE AS DIRECTED TO CHECK BLOOD SUGARS 2 TIMES PER DAY   TRUEplus Lancets 33G MISC TEST BLOOD SUGAR TWICE DAILY AS DIRECTED     Allergies:   Other, Penicillins, Sulfa antibiotics, Aspirin, and Codeine   Social History   Socioeconomic History   Marital status: Divorced    Spouse name: Not on file   Number of children: Not on file   Years of education: Not on file   Highest education level: Not on file  Occupational History   Occupation: Disabled    Comment: CNA  Tobacco Use   Smoking status: Former    Packs/day: 0.50    Years: 25.00    Additional pack years: 0.00    Total pack years: 12.50    Types: Cigarettes   Smokeless tobacco: Never   Tobacco comments:    quit smoking 20+ytrs ago  Vaping Use   Vaping Use: Never used  Substance and Sexual Activity   Alcohol use: No   Drug use: No   Sexual activity: Not Currently    Birth control/protection: Surgical  Other Topics Concern   Not on file  Social History Narrative   She lives in [place]. She has lots of family in the area and is accompanied by her sister.   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   She has a distant history of smoking, quit 20 years ago.   Right handed    Social Determinants of Health   Financial Resource Strain: Low Risk  (07/18/2022)   Overall Financial Resource Strain (CARDIA)    Difficulty of Paying Living Expenses: Not hard at all  Food Insecurity: No Food Insecurity (07/18/2022)   Hunger Vital Sign    Worried About Running Out of Food in the Last Year: Never true    Ran Out of Food in the Last Year: Never true  Transportation Needs: No Transportation Needs (07/18/2022)   PRAPARE - Therapist, art (Medical): No    Lack of Transportation (Non-Medical): No  Physical Activity: Inactive (07/18/2022)   Exercise Vital Sign    Days of Exercise per Week: 0 days    Minutes of Exercise per Session: 0 min  Stress: No Stress Concern Present (07/18/2022)   [employer] of Occupational Health - Occupational Stress Questionnaire    Feeling of Stress : Not at all  Social Connections: Not on file     Family History: The patient's family history includes Allergies in her father; Cancer in an other family member; Diabetes in her brother, mother, sister, and son; Heart disease in her father, mother, and sister; Heart failure in her father; Hyperlipidemia in her father, mother, and sister; Hypertension in her brother, father, mother, sister, sister, and son.  ROS:   Please see the history of present illness.    All other systems reviewed and are negative.  EKGs/Labs/Other Studies Reviewed:    The following studies were reviewed today: Cardiac Studies & Procedures   CARDIAC CATHETERIZATION  CARDIAC CATHETERIZATION 09/07/2022  Narrative 1. Normal filling pressures 2. Moderate pulmonary hypertension in setting of ASD 3. Qp/Qs 1.5/1  Suspect hemodynamically significant ASD.   CARDIAC CATHETERIZATION  CARDIAC CATHETERIZATION 11/09/2019  Narrative 1. Successful deployment of Cardiomems pressure sensor. 2. Normal RA pressure and PCWP. 3. Moderate pulmonary hypertension, PVR not significantly elevated.  Think PH is due to high cardiac output. 4. Despite high calculated cardiac output, no evidence for significant intracardiac shunt lesion.   STRESS TESTS  MYOCARDIAL PERFUSION IMAGING 01/29/2018  Narrative  The left ventricular ejection fraction is normal (55-65%).  Nuclear stress EF: 58%.  There was no ST segment deviation noted during stress.  The study is normal.  This is a low risk study.  Normal stress nuclear study with no ischemia or infarction.   Gated ejection fraction 58% with normal wall motion.  Note significant right ventricular enlargement.  Suggest echocardiogram to further assess.   ECHOCARDIOGRAM  ECHOCARDIOGRAM COMPLETE 11/15/2020  Narrative ECHOCARDIOGRAM REPORT    Patient Name:   [name] [facility] Date of Exam: 11/15/2020 Medical Rec #:  [id number]       Height:       60.0 in Accession #:    [id number]      Weight:       198.2 lb Date of Birth:  1945/11/18      BSA:          1.859 m Patient Age:    74 years        BP:           122/79 mmHg Patient Gender: F               HR:           77 bpm. Exam Location:  Inpatient  Procedure:  2D Echo, Cardiac Doppler and Color Doppler  Indications:    Dyspnea R06.00  History:        Patient has prior history of Echocardiogram examinations, most recent 03/21/2020. CHF, Previous Myocardial Infarction, COPD; Risk Factors:Hypertension, Diabetes, Sleep Apnea and Dyslipidemia. COVID-19. Seizures. CKD.  Sonographer:    Elmarie Shiley Dance Referring Phys: [id number] Kateri Mc LATIF SHEIKH  IMPRESSIONS   1. Left ventricular ejection fraction, by estimation, is 60 to 65%. The left ventricle has normal function. The left ventricle has no regional wall motion abnormalities. There is mild left ventricular hypertrophy. Left ventricular diastolic parameters are consistent with Grade I diastolic dysfunction (impaired relaxation). 2. Right ventricular systolic function is normal. The right ventricular size is normal. There is normal pulmonary artery systolic pressure. 3. Left atrial size was severely dilated. 4. The mitral valve is normal in structure. No evidence of mitral valve regurgitation. No evidence of mitral stenosis. 5. The aortic valve is normal in structure. There is mild calcification of the aortic valve. There is mild thickening of the aortic valve. Aortic valve regurgitation is not visualized. Mild aortic valve sclerosis is present, with no evidence of aortic valve stenosis. 6. The  inferior vena cava is normal in size with greater than 50% respiratory variability, suggesting right atrial pressure of 3 mmHg.  FINDINGS Left Ventricle: Left ventricular ejection fraction, by estimation, is 60 to 65%. The left ventricle has normal function. The left ventricle has no regional wall motion abnormalities. The left ventricular internal cavity size was normal in size. There is mild left ventricular hypertrophy. Left ventricular diastolic parameters are consistent with Grade I diastolic dysfunction (impaired relaxation).  Right Ventricle: The right ventricular size is normal. No increase in right ventricular wall thickness. Right ventricular systolic function is normal. There is normal pulmonary artery systolic pressure. The tricuspid regurgitant velocity is 2.36 m/s, and with an assumed right atrial pressure of 8 mmHg, the estimated right ventricular systolic pressure is 30.3 mmHg.  Left Atrium: Left atrial size was severely dilated.  Right Atrium: Right atrial size was normal in size.  Pericardium: There is no evidence of pericardial effusion.  Mitral Valve: The mitral valve is normal in structure. No evidence of mitral valve regurgitation. No evidence of mitral valve stenosis.  Tricuspid Valve: The tricuspid valve is normal in structure. Tricuspid valve regurgitation is not demonstrated. No evidence of tricuspid stenosis.  Aortic Valve: The aortic valve is normal in structure. There is mild calcification of the aortic valve. There is mild thickening of the aortic valve. Aortic valve regurgitation is not visualized. Mild aortic valve sclerosis is present, with no evidence of aortic valve stenosis.  Pulmonic Valve: The pulmonic valve was normal in structure. Pulmonic valve regurgitation is trivial. No evidence of pulmonic stenosis.  Aorta: The aortic root is normal in size and structure.  Venous: The inferior vena cava is normal in size with greater than 50% respiratory  variability, suggesting right atrial pressure of 3 mmHg.  IAS/Shunts: No atrial level shunt detected by color flow Doppler.   LEFT VENTRICLE PLAX 2D LVIDd:         4.60 cm LVIDs:         2.90 cm LV PW:         1.20 cm LV IVS:        1.10 cm LVOT diam:     2.10 cm LV SV:         70 LV SV Index:   38 LVOT Area:     3.46  cm   RIGHT VENTRICLE          IVC RV Basal diam:  3.80 cm  IVC diam: 2.70 cm RV Mid diam:    2.60 cm TAPSE (M-mode): 1.9 cm  LEFT ATRIUM              Index       RIGHT ATRIUM           Index LA diam:        5.40 cm  2.90 cm/m  RA Area:     19.70 cm LA Vol (A2C):   94.2 ml  50.66 ml/m RA Volume:   56.00 ml  30.12 ml/m LA Vol (A4C):   109.0 ml 58.62 ml/m LA Biplane Vol: 103.0 ml 55.39 ml/m AORTIC VALVE LVOT Vmax:   108.05 cm/s LVOT Vmean:  69.300 cm/s LVOT VTI:    0.202 m  AORTA Ao Root diam: 2.70 cm Ao Asc diam:  3.00 cm  MITRAL VALVE               TRICUSPID VALVE MV Area (PHT): 2.99 cm    TR Peak grad:   22.3 mmHg MV Decel Time: 254 msec    TR Vmax:        236.00 cm/s MV E velocity: 94.65 cm/s MV A velocity: 80.90 cm/s  SHUNTS MV E/A ratio:  1.17        Systemic VTI:  0.20 m Systemic Diam: 2.10 cm  Donato Schultz MD Electronically signed by Donato Schultz MD Signature Date/Time: 11/15/2020/1:15:09 PM    Final   TEE  ECHO TEE 08/16/2022  Narrative TRANSESOPHOGEAL ECHO REPORT    Patient Name:   [name] [facility] Date of Exam: 08/15/2022 Medical Rec #:  [id number]       Height:       61.0 in Accession #:    [id number]      Weight:       187.0 lb Date of Birth:  Mar 03, 1946      BSA:          1.836 m Patient Age:    76 years        BP:           120/68 mmHg Patient Gender: F               HR:           76 bpm. Exam Location:  Inpatient  Procedure: Transesophageal Echo, 3D Echo, Color Doppler and Cardiac Doppler  Indications:     I48.91* Unspecified atrial fibrillation  History:         Patient has prior history of Echocardiogram  examinations, most recent 11/15/2020. CHF; Risk Factors:Hypertension, Diabetes, Dyslipidemia and Sleep Apnea.  Sonographer:     Irving Burton Senior RDCS Referring Phys:  [id number] [facility] Diagnosing Phys: Wilfred Lacy  PROCEDURE: After discussion of the risks and benefits of a TEE, an informed consent was obtained from the patient. The transesophogeal probe was passed without difficulty through the esophogus of the patient. Sedation performed by different physician. The patient was monitored while under deep sedation. Anesthestetic sedation was provided intravenously by Anesthesiology:  of Propofol,  of Lidocaine. The patient developed no complications during the procedure. A successful direct current cardioversion was performed at 200 joules with 1 attempt.  IMPRESSIONS   1. Left ventricular ejection fraction, by estimation, is 60 to 65%. The left ventricle has normal function. The left ventricle has no regional wall motion abnormalities. There is  mild concentric left ventricular hypertrophy. 2. Right ventricular systolic function is normal. The right ventricular size is mildly enlarged. 3. Peak RV-RA gradient 31 mmHg. 4. Measurements done for Watchman. Left atrial size was severely dilated. No left atrial/left atrial appendage thrombus was detected. 5. Right atrial size was mildly dilated. 6. There is a small 1.9 x 1.2 cm relatively high secundum ASD with left to right flow. The pulmonary veins appear to drain normally to the left atrium. 7. The mitral valve is normal in structure. Trivial mitral valve regurgitation. No evidence of mitral stenosis. 8. The aortic valve is tricuspid. Aortic valve regurgitation is not visualized. No aortic stenosis is present.  FINDINGS Left Ventricle: Left ventricular ejection fraction, by estimation, is 60 to 65%. The left ventricle has normal function. The left ventricle has no regional wall motion abnormalities. The left ventricular internal cavity  size was normal in size. There is mild concentric left ventricular hypertrophy.  Right Ventricle: The right ventricular size is mildly enlarged. No increase in right ventricular wall thickness. Right ventricular systolic function is normal.  Left Atrium: Measurements done for Watchman. Left atrial size was severely dilated. No left atrial/left atrial appendage thrombus was detected.  Right Atrium: Right atrial size was mildly dilated.  Pericardium: There is no evidence of pericardial effusion.  Mitral Valve: The mitral valve is normal in structure. Trivial mitral valve regurgitation. No evidence of mitral valve stenosis.  Tricuspid Valve: Peak RV-RA gradient 31 mmHg. The tricuspid valve is normal in structure. Tricuspid valve regurgitation is mild.  Aortic Valve: The aortic valve is tricuspid. Aortic valve regurgitation is not visualized. No aortic stenosis is present.  Pulmonic Valve: The pulmonic valve was normal in structure. Pulmonic valve regurgitation is not visualized.  Aorta: The aortic root is normal in size and structure.  IAS/Shunts: There is a small 1.9 x 1.2 cm relatively high secundum ASD with left to right flow. The pulmonary veins appear to drain normally to the left atrium.  Additional Comments: Spectral Doppler performed.  [employer] Electronically signed by Wilfred Lacy Signature Date/Time: 08/16/2022/3:56:59 PM    Final   MONITORS  LONG TERM MONITOR (3-14 DAYS) 12/02/2019   CT SCANS  CT CORONARY MORPH W/CTA COR W/SCORE 05/15/2018  Addendum 05/15/2018  9:57 PM ADDENDUM REPORT: 05/15/2018 21:54  CLINICAL DATA:  4F with hypertension and diabetes admitted with chest pain.  EXAM: Cardiac/Coronary  CT  TECHNIQUE: The patient was scanned on a [employer].  FINDINGS: A 120 kV prospective scan was triggered in the descending thoracic aorta at 111 HU's. Axial non-contrast 3 mm slices were carried out through the heart. The data set  was analyzed on a dedicated work station and scored using the Agatson method. Gantry rotation speed was 250 msecs and collimation was .6 mm. No beta blockade and 0.8 mg of sl NTG was given. The 3D data set was reconstructed in 5% intervals of the 67-82 % of the R-R cycle. Diastolic phases were analyzed on a dedicated work station using MPR, MIP and VRT modes. The patient received 80 cc of contrast.  Aorta: Normal size. Ascending aorta 2.9 cm. Mild calcification of the aortic root. No dissection.  Aortic Valve:  Trileaflet.  No calcifications.  Coronary Arteries:  Normal coronary origin.  Right dominance.  RCA is a small, non-dominant artery. There is minimal (1-24%) non-obstructing mixed plaque in the proximal RCA.  Left main is a large artery that gives rise to LAD and LCX arteries.  LAD is a very  large vessel that wraps the apex and gives rise to the PDA. The LAD has minimal (1-24%) mixed calcified and soft attenuation plaque proximally and mild (25-49%) soft plaque in the mid LAD, worse at the level of D1. There is moderate (25-49%) mixed plaque in the apical LAD.  LCX is a non-dominant artery that gives rise to two OM branches. There is minimal (1-24%) mixed plaque proximally.  Other findings:  Normal pulmonary vein drainage into the left atrium.  Normal let atrial appendage without a thrombus.  Pulmonary artery is enlarged consistent with elevated pulmonary pressures.  IMPRESSION: 1.  Study quality is limited by body habitus.  2. Coronary calcium score of 71.9. This was 77th percentile for age and sex matched control.  2.  Normal coronary origin with hyperdominant LAD.  3. There is mild plaque in the mid LAD and moderate plaque in the distal LAD.  4.  Will send study for FFRct.  5.  Recommend aggressive risk factor modification.  6. Dilation of the pulmonary artery consistent with elevated pulmonary pressures.  Chilton Si, MD   Electronically  Signed By: Chilton Si On: 05/15/2018 21:54  Narrative EXAM: OVER-READ INTERPRETATION  CT CHEST  The following report is an over-read performed by radiologist Dr. Genevive Bi of [facility] on 05/15/2018. This over-read does not include interpretation of cardiac or coronary anatomy or pathology. The coronary CTA interpretation by the cardiologist is attached.  COMPARISON:  Chest radiograph 05/15/2018  FINDINGS: Limited view of the lung parenchyma demonstrates mild basilar atelectasis. Airways are normal.  Limited view of the mediastinum demonstrates no adenopathy. Esophagus normal.  Limited view of the upper abdomen unremarkable.  Limited view of the skeleton and chest wall is unremarkable. Degenerative osteophytosis of the spine.  IMPRESSION: 1. Mild basilar atelectasis. 2. Degenerate spurring of the spine  Electronically Signed: By: Genevive Bi M.D. On: 05/15/2018 19:14          EKG:  EKG is not ordered today.    Recent Labs: 05/17/2022: Magnesium 2.1 07/26/2022: ALT 10 09/28/2022: B Natriuretic Peptide 132.4; BUN 11; Creatinine, Ser 1.05; Potassium 3.7; Sodium 136 10/01/2022: Hemoglobin 7.0; Platelets 348  Recent Lipid Panel    Component Value Date/Time   CHOL 145 05/04/2021 1014   CHOL 145 10/22/2018 1446   TRIG 16 05/04/2021 1014   HDL 79 05/04/2021 1014   HDL 70 10/22/2018 1446   CHOLHDL 1.8 05/04/2021 1014   VLDL 3 05/04/2021 1014   LDLCALC 63 05/04/2021 1014   LDLCALC 65 10/22/2018 1446   LDLDIRECT 66.0 01/27/2015 1104     Risk Assessment/Calculations:            Physical Exam:    VS:  BP (!) 110/50 Comment: 110/50 in left arm  Pulse 70   Ht  (1.549 m)   Wt 181 lb 6.4 oz (82.3 kg)   SpO2 98%   BMI 34.28 kg/m     Wt Readings from Last 3 Encounters:  10/08/22 181 lb 6.4 oz (82.3 kg)  10/05/22 176 lb (79.8 kg)  10/01/22 177 lb 9.6 oz (80.6 kg)     GEN:  Well nourished, well developed in no acute  distress HEENT: Normal NECK: No JVD; No carotid bruits LYMPHATICS: No lymphadenopathy CARDIAC: RRR, 2/6 systolic murmur at the RUSB RESPIRATORY:  Clear to auscultation without rales, wheezing or rhonchi  ABDOMEN: Soft, non-tender, non-distended MUSCULOSKELETAL:  No edema; No deformity  SKIN: Warm and dry NEUROLOGIC:  Alert and oriented x 3  PSYCHIATRIC:  Normal affect   ASSESSMENT:    1. ASD (atrial septal defect)   2. Chronic diastolic CHF (congestive heart failure), NYHA class 2   3. Pulmonary hypertension, unspecified    PLAN:    In order of problems listed above:  The patient has a complex history, but recent TEE confirms a moderate-sized secundum atrial septal defect with significant left-to-right shunt by right heart catheterization and oxygen step up.  She has progressive symptoms of exertional dyspnea and fatigue.  I suspect over time her shunt is increased possibly with the presence of diastolic dysfunction and elevated LVEDP.  She does have mild to moderate pulmonary hypertension but nothing prohibitive for ASD closure.  In fact, ASD closure is indicated to prevent further worsening of her pulmonary hypertension.  I reviewed treatment options with the patient today.  We discussed risks, indications, and alternatives to transcatheter ASD closure.  She understands the risks of transcatheter closure include but are not limited to bleeding, infection, device embolization, device erosion, stroke, myocardial infarction, arrhythmia, cardiac injury with hemopericardium, need for emergent pericardiocentesis, need for emergent surgery, and death, all occurring at an incidence of 1% or less except for arrhythmia which can occur at an incidence of 2-3%.  After our discussion today, the patient would like to talk things over with her family.  In review of her labs, she has significant anemia with a hemoglobin of 7.  She needs to have a higher hemoglobin before undergoing transcatheter closure.  In  reviewing her upcoming schedule, it looks like she is scheduled for blood transfusion and hematology consultation.  I will plan to see her back in 4 to 6 weeks to follow-up on her labs and allow her some time to discuss things with her family.  I will plan on proceeding with transcatheter ASD closure once she is seen back in the office.           Medication Adjustments/Labs and Tests Ordered: Current medicines are reviewed at length with the patient today.  Concerns regarding medicines are outlined above.  No orders of the defined types were placed in this encounter.  No orders of the defined types were placed in this encounter.   Patient Instructions  Medication Instructions:  Your physician recommends that you continue on your current medications as directed. Please refer to the Current Medication list given to you today.  *If you need a refill on your cardiac medications before your next appointment, please call your pharmacy*   Lab Work: NONE If you have labs (blood work) drawn today and your tests are completely normal, you will receive your results only by: MyChart Message (if you have MyChart) OR A paper copy in the mail If you have any lab test that is abnormal or we need to change your treatment, we will call you to review the results.   Testing/Procedures: NONE   Follow-Up: At [facility], you and your health needs are our priority.  As part of our continuing mission to provide you with exceptional heart care, we have created designated Provider Care Teams.  These Care Teams include your primary Cardiologist (physician) and Advanced Practice Providers (APPs -  Physician Assistants and Nurse Practitioners) who all work together to provide you with the care you need, when you need it.  We recommend signing up for the patient portal called "MyChart".  Sign up information is provided on this After Visit Summary.  MyChart is used to connect with patients for Virtual  Visits (  Telemedicine).  Patients are able to view lab/test results, encounter notes, upcoming appointments, etc.  Non-urgent messages can be sent to your provider as well.   To learn more about what you can do with MyChart, go to [email or web address].    Your next appointment:   4-6 weeks  Provider:   Tonny Bollman, MD       Signed, Tonny Bollman, MD  10/10/2022 6:22 AM    [facility]

## 2022-10-08 NOTE — Patient Instructions (Signed)
Medication Instructions:  Your physician recommends that you continue on your current medications as directed. Please refer to the Current Medication list given to you today.  *If you need a refill on your cardiac medications before your next appointment, please call your pharmacy*   Lab Work: NONE If you have labs (blood work) drawn today and your tests are completely normal, you will receive your results only by: MyChart Message (if you have MyChart) OR A paper copy in the mail If you have any lab test that is abnormal or we need to change your treatment, we will call you to review the results.   Testing/Procedures: NONE   Follow-Up: At [facility], you and your health needs are our priority.  As part of our continuing mission to provide you with exceptional heart care, we have created designated Provider Care Teams.  These Care Teams include your primary Cardiologist (physician) and Advanced Practice Providers (APPs -  Physician Assistants and Nurse Practitioners) who all work together to provide you with the care you need, when you need it.  We recommend signing up for the patient portal called "MyChart".  Sign up information is provided on this After Visit Summary.  MyChart is used to connect with patients for Virtual Visits (Telemedicine).  Patients are able to view lab/test results, encounter notes, upcoming appointments, etc.  Non-urgent messages can be sent to your provider as well.   To learn more about what you can do with MyChart, go to [email or web address].    Your next appointment:   4-6 weeks  Provider:   Tonny Bollman, MD

## 2022-10-09 ENCOUNTER — Other Ambulatory Visit (HOSPITAL_COMMUNITY): Payer: Self-pay | Admitting: Family Medicine

## 2022-10-09 DIAGNOSIS — D649 Anemia, unspecified: Secondary | ICD-10-CM

## 2022-10-12 ENCOUNTER — Ambulatory Visit (HOSPITAL_COMMUNITY)
Admission: RE | Admit: 2022-10-12 | Discharge: 2022-10-12 | Disposition: A | Discharge status: Home / Self Care | Payer: Medicare HMO | Source: Ambulatory Visit | Attending: Cardiology | Admitting: Cardiology

## 2022-10-12 ENCOUNTER — Encounter (HOSPITAL_COMMUNITY): Payer: Medicare HMO

## 2022-10-12 DIAGNOSIS — D649 Anemia, unspecified: Secondary | ICD-10-CM | POA: Insufficient documentation

## 2022-10-12 LAB — BPAM RBC
Blood Product Expiration Date: 202404252359
ISSUE DATE / TIME: 202404191003

## 2022-10-12 LAB — TYPE AND SCREEN: Antibody Screen: NEGATIVE

## 2022-10-12 LAB — PREPARE RBC (CROSSMATCH)

## 2022-10-12 MED ORDER — SODIUM CHLORIDE 0.9% IV SOLUTION: Freq: Once | INTRAVENOUS | Status: DC

## 2022-10-12 NOTE — Progress Notes (Signed)
Patient reports heart failure, rate maintained at 141ml/hour

## 2022-10-13 LAB — TYPE AND SCREEN
ABO/RH(D): B POS
Unit division: 0

## 2022-10-13 LAB — BPAM RBC: Unit Type and Rh: 7300

## 2022-10-15 ENCOUNTER — Other Ambulatory Visit: Payer: Self-pay | Admitting: Cardiology

## 2022-10-18 ENCOUNTER — Telehealth: Payer: Self-pay | Admitting: Pulmonary Disease

## 2022-10-18 ENCOUNTER — Other Ambulatory Visit (INDEPENDENT_AMBULATORY_CARE_PROVIDER_SITE_OTHER): Payer: Medicare HMO | Admitting: Internal Medicine

## 2022-10-18 DIAGNOSIS — G4733 Obstructive sleep apnea (adult) (pediatric): Secondary | ICD-10-CM

## 2022-10-18 DIAGNOSIS — D509 Iron deficiency anemia, unspecified: Secondary | ICD-10-CM

## 2022-10-18 LAB — HEMOCCULT GUIAC POC 1CARD (OFFICE)
Card #2 Fecal Occult Blod, POC: NEGATIVE
Card #3 Fecal Occult Blood, POC: NEGATIVE
Fecal Occult Blood, POC: NEGATIVE

## 2022-10-18 NOTE — Telephone Encounter (Signed)
Pt. Calling she needs new order placed for a Cpap machine her's died  can we please advise

## 2022-10-18 NOTE — Telephone Encounter (Signed)
Ok to place order for new CPAP

## 2022-10-19 NOTE — Telephone Encounter (Signed)
Placed order for new C-Pap and notified pt. Pt stated understanding. Nothing further needed at this time.

## 2022-10-20 NOTE — Progress Notes (Unsigned)
[facility] [phone]   [phone]  CONSULT NOTE  REFERRING PHYSICIAN: Dr. Allyne Gee  REASON FOR CONSULTATION:  Iron deficiency anemia  HPI [name] is a 77 y.o. female with a past medical history significant for lower GI bleed, diabetes, pulmonary fibrosis, seizure disorder, hyperlipidemia, obstructive sleep apnea, congestive heart failure, and major depressive disorder is referred to clinic for iron deficiency anemia.   The patient was seen by her PCP earlier this month for diabetes and hypertension follow-up on 10/01/2022.  She had routine blood work that showed microcytic anemia with a hemoglobin of 7.0 and low MCV at 72.  She received a blood transfusion for this. Her iron studies showed low iron at 14, ferritin of 20, and low saturation of 4%.  She is referred to the clinic for further evaluation and recommendations regarding this finding.  She is currently taking iron supplements, although she ran out of the sample her PCP gave her which did not cause as much GI upset.  She has been on iron supplements for several years.  She also has required blood transfusion in the past on multiple occasions.  The lowest hemoglobin I sees 5.25 March 2021 for which she was hospitalized for presumed lower GI bleed.  She had a colonoscopy at that time which showed internal and external hemorrhoids and diverticulosis.  She has follow-up with Dr. Dulce Sellar from GI.    It also looks like she has received Feraheme in May 2019.    The oldest records I have are from 2009.  She had mild intermittent anemia at that time but nothing severe.  She developed persistent anemia in February 2012. Her anemia improved in 2016 and 2019.  She had some mild anemia in 2021 2021.  It appears that she is having persistent anemia since 2022.  Overall, she is symptomatic from her anemia.  She reports chronic fatigue, lightheadedness, and [place] exertion.  She sometimes may have  palpitations but she also mention she has a "hole in her heart".  She is followed by cardiology.  She denies any gingival bleeding, epistaxis, hemoptysis, hematemesis, or melena.  She states that she has not had any bright red blood in her stool "lately" she submitted stool cards to her PCPs office last week which appeared to be negative for blood.  She denies any fever, chills, night sweats, or unexplained weight loss.  She does report she states cold from her anemia.  She denies NSAID use.  She does take Eliquis.  She denies any particular dietary habits such as being a vegan or vegetarian although she does not eat a lot of red meat.  She does not crave ice chips.  She has a chronic cough for which she follows with pulmonary medicine for pulmonary fibrosis.  Denies any history of bariatric surgery.  For her family medical history, her mother had diabetes, breast cancer a brain tumor, and heart disease.  Her paternal grandmother had breast cancer.  The patient's father had rectal bleeding.  Denies any family history of sickle cell anemia.  The patient used to work as a Agricultural engineer.  She is divorced.  She has 2 children.  She is a former smoker having quit over 20 years ago.  She estimates she smoked 10 cigarettes/day on average.   HPI    Past Medical History:  Diagnosis Date   Abnormal liver function     in the past.   Anemia    Arthritis  all over   Bruises easily    Cataract    right eye;immature   CHF (congestive heart failure) (HCC)    Chronic back pain    stenosis   Chronic cough    Chronic kidney disease    COPD (chronic obstructive pulmonary disease) (HCC)    COVID    aug/sept 2021   Demand myocardial infarction [facility]) 2012   Demand Infarction in setting of Pancreatitis --> mild Troponin elevation, NON-OBSTRUCTIVE CAD   Depression    takes Abilify daily as well as Zoloft   Diastolic heart failure 2010   Grade 1 diastolic Dysfunction by Echo    Diverticulosis    DM  (diabetes mellitus) (HCC)    takes Victoza daily as well as Lantus and Humalog   Empty sella (HCC)    on MRI in 2009.   Glaucoma    Headache(784.0)    last migraine-4-25yrs ago   History of blood transfusion    no abnormal reaction noted   History of colon polyps    benign   HTN (hypertension)    takes Benicar,Imdur,and Bystolic daily   Hyperlipidemia    takes Lipitor daily   Joint swelling    Nocturia    Obesity hypoventilation syndrome (HCC)    Obstructive sleep apnea 02/2018   Notably improved split-night study with weight loss from 270 (2017) down to 250 pounds (2019).   Pancreatitis    takes Pancrelipase daily   Parkinson's disease    takes Sinemet daily   Peripheral neuropathy    Pneumonia 2012   Seizures (HCC)    takes Lamictal daily and Primidone nightly;last seizure 2wks ago   Urinary urgency    With increased frequency   Varicose veins of both lower extremities with pain    With edema.  Takes daily Lasix    Past Surgical History:  Procedure Laterality Date   ABDOMINAL HYSTERECTOMY     APPENDECTOMY     BACK SURGERY     Cardiac Event Monitor  September-October 2017   Sinus rhythm with occasional PACs and artifact. No arrhythmias besides one short run of tachycardia.   CARDIOVERSION N/A 08/15/2022   Procedure: CARDIOVERSION;  Surgeon: Laurey Morale, MD;  Location: [facility] ENDOSCOPY;  Service: Cardiovascular;  Laterality: N/A;   CHOLECYSTECTOMY     COLONOSCOPY N/A 08/15/2012   Procedure: COLONOSCOPY;  Surgeon: Willis Modena, MD;  Location: [facility] ENDOSCOPY;  Service: Endoscopy;  Laterality: N/A;   COLONOSCOPY WITH PROPOFOL Left 06/17/2017   Procedure: COLONOSCOPY WITH PROPOFOL;  Surgeon: Kerin Salen, MD;  Location: [facility] ENDOSCOPY;  Service: Gastroenterology;  Laterality: Left;   COLONOSCOPY WITH PROPOFOL N/A 04/03/2021   Procedure: COLONOSCOPY WITH PROPOFOL;  Surgeon: Vida Rigger, MD;  Location: [facility] ENDOSCOPY;  Service: Endoscopy;  Laterality: N/A;   CORONARY CALCIUM  SCORE AND CTA  04/2018   Coronary calcium score 71.9.  Very large, hyperdynamic LAD wrapping apex giving rise to PDA.  Moderate LAD-diagonal and circumflex plaque -> CT FFR suggested positive findings in distal D1, D2 and distal circumflex.  Referred for cath. ==> FALSE POSITIVE   ESOPHAGOGASTRODUODENOSCOPY     eye cysts Bilateral    LASIK     LEFT HEART CATH AND CORONARY ANGIOGRAPHY N/A 05/16/2018   Procedure: LEFT HEART CATH AND CORONARY ANGIOGRAPHY;  Surgeon: Lyn Records, MD;  Location: [facility] INVASIVE CV LAB:  Angiographically normal coronary arteries with LVEDP of 21 mmHg.  Large draping hyperdominant LAD that wraps the apex and provides distal half of the PDA.  Relatively small caliber distal Cx -->proxLPDA, non-dom RCA. No Cx or Diag lesions -- FALSE + CT FFR   LEFT HEART CATH AND CORONARY ANGIOGRAPHY  2009/March 2012   2009: (Dr. Janene Harvey) Nonobstructive CAD; 2012: Minimal CAD --> false positive stress test   NM MYOVIEW LTD  01/2016; 01/2018   a)LOW RISK. No ischemia or infarction;; b) EF 55-60%. NO ST changes.  No ischemia or Infarction. NO significant RV enlargement.  LOW RISK.   POLYPECTOMY  04/03/2021   Procedure: POLYPECTOMY;  Surgeon: Vida Rigger, MD;  Location: [facility] ENDOSCOPY;  Service: Endoscopy;;   Polysomnogram  02/2018   (Dr. Vickey Huger from neurology): Split study was not adequately done due to low AHI.  She slept reclined and had an AHI less than 10, prolonged hypoxemia and no hypercapnia. -->  She has home oxygen already prescribed, but did not meet criteria for nightly oxygen.  (Was noted that the patient did not cooperate well with study.  Titration was discussed.   PRESSURE SENSOR/CARDIOMEMS N/A 11/09/2019   Procedure: PRESSURE SENSOR/CARDIOMEMS;  Surgeon: Laurey Morale, MD;  Location: [facility] INVASIVE CV LAB;  Service: Cardiovascular;  Laterality: N/A;   RECTAL POLYPECTOMY     RIGHT HEART CATH N/A 09/07/2022   Procedure: RIGHT HEART CATH;  Surgeon: Laurey Morale, MD;  Location:  [facility] INVASIVE CV LAB;  Service: Cardiovascular;  Laterality: N/A;   RIGHT/LEFT HEART CATH AND CORONARY ANGIOGRAPHY N/A 04/06/2019   Procedure: RIGHT/LEFT HEART CATH AND CORONARY ANGIOGRAPHY;  Surgeon: Marykay Lex, MD;  Location: [facility] INVASIVE CV LAB; angiographically normal coronary arteries.  Moderately elevated AD LVEDP..  Mild pulmonary hypertension: PA P 56/14 mmHg-mean 31 mmHg.  RAP 10 mmHg.  RV P-EDP 54/4 mmHg - 12 mmHg.  PCWP 11-15 mmHg.  CO-CI: 7.35-3.73.   TEE WITHOUT CARDIOVERSION N/A 08/15/2022   Procedure: TRANSESOPHAGEAL ECHOCARDIOGRAM (TEE);  Surgeon: Laurey Morale, MD;  Location: [facility] ENDOSCOPY;  Service: Cardiovascular;  Laterality: N/A;   TONSILLECTOMY     TRANSTHORACIC ECHOCARDIOGRAM  01/2016; 05/2017   A) Normal LV chamber size with mod LVH pattern. EF 50-55%. Severe LA dilation. Mod RA dilation. PAP elevated at 38 mmHg;; B) Mild concentric LVH.  EF 55-60% & no RWMA.  Grade 2 DD.  Diastolic flattening of the ventricular septum == ? elevated PAP.  Mild aortic valve calcification/sclerosis.  Mod LA dilation.  Mild RV dilation & Mod RA dilation   TRANSTHORACIC ECHOCARDIOGRAM  10/2017; 01/2018   a) EF 55-60%.  No RWMA,  Moderate LA dilation.  Mild LA dilation.  Peak PA pressures ~57 mmHg (moderate);;; b) EF 55-60%.  GRII DD.  Suggestion of RV volume overload with moderate RV dilation.  Severe LA and RA dilation.Marland Kitchen  PA pressure ~67%.   TRANSTHORACIC ECHOCARDIOGRAM  01/2019   Normal LV size and function.  EF 50 to 55%.  Impaired relaxation.  RV appears to have moderately reduced function.  Severely enlarged.  Cannot fully assess RV pressures.  Hypermobile interatrial septum.  Right atrium severely dilated.   TUBAL LIGATION     vaginal cyst removed     several times    Family History  Problem Relation Age of Onset   Allergies Father    Heart disease Father        before 68   Heart failure Father    Hypertension Father    Hyperlipidemia Father    Heart disease Mother    Diabetes  Mother    Hypertension Mother    Hyperlipidemia Mother  Hypertension Sister    Heart disease Sister        before 9   Hyperlipidemia Sister    Diabetes Brother    Hypertension Brother    Diabetes Sister    Hypertension Sister    Diabetes Son    Hypertension Son    Cancer Other     Social History Social History   Tobacco Use   Smoking status: Former    Packs/day: 0.50    Years: 25.00    Additional pack years: 0.00    Total pack years: 12.50    Types: Cigarettes   Smokeless tobacco: Never   Tobacco comments:    quit smoking 20+ytrs ago  Vaping Use   Vaping Use: Never used  Substance Use Topics   Alcohol use: No   Drug use: No    Allergies  Allergen Reactions   Other Anaphylaxis and Rash    Bleach   Penicillins Hives    Did it involve swelling of the face/tongue/throat, SOB, or low BP? No Did it involve sudden or severe rash/hives, skin peeling, or any reaction on the inside of your mouth or nose? Yes Did you need to seek medical attention at a hospital or doctor's office? Yes When did it last happen?      Childhood allergy  If all above answers are "NO", may proceed with cephalosporin use.     Sulfa Antibiotics Hives   Aspirin Other (See Comments)     On aspirin 81 mg - Rectal bleeding in dec 2018   Codeine     Headache and makes the patient feel "off"    Current Outpatient Medications  Medication Sig Dispense Refill   acetaminophen (TYLENOL) 500 MG tablet Take 500-1,000 mg by mouth every 4 (four) hours as needed for moderate pain.     albuterol (VENTOLIN HFA) 108 (90 Base) MCG/ACT inhaler Inhale 1-2 puffs into the lungs every 6 (six) hours as needed for wheezing or shortness of breath. 18 g 5   Alcohol Swabs (DROPSAFE ALCOHOL PREP) 70 % PADS USE TWO TIMES DAILY AS DIRECTED 200 each 6   apixaban (ELIQUIS) 5 MG TABS tablet Take 1 tablet (5 mg total) by mouth 2 (two) times daily. 180 tablet 1   ARIPiprazole (ABILIFY) 10 MG tablet Take 10 mg by mouth daily.      Ascorbic Acid (VITAMIN C) 1000 MG tablet Take 1,000 mg by mouth daily.     atorvastatin (LIPITOR) 10 MG tablet TAKE 1 TABLET EVERY EVENING 90 tablet 2   B-D ULTRAFINE III SHORT PEN 31G X 8 MM MISC USE DAILY WITH VICTOZA AND/OR NOVOLOG. 400 each 2   benzonatate (TESSALON) 100 MG capsule TAKE 1 CAPSULE THREE TIMES DAILY AS NEEDED FOR COUGH 30 capsule 1   Blood Glucose Monitoring Suppl (TRUE METRIX METER) w/Device KIT USE AS DIRECTED 1 kit 1   carvedilol (COREG) 12.5 MG tablet TAKE 1 TABLET TWICE DAILY 180 tablet 3   dexlansoprazole (DEXILANT) 60 MG capsule Take 60 mg by mouth daily.     diclofenac sodium (VOLTAREN) 1 % GEL Apply 2 g topically 4 (four) times daily as needed (pain).     FeFum-FePoly-FA-B Cmp-C-Biot (INTEGRA PLUS) CAPS Take 1 capsule by mouth every morning. 30 capsule 2   ferrous sulfate 324 MG TBEC Take 324 mg by mouth daily with breakfast.     furosemide (LASIX) 20 MG tablet TAKE 4 TABLETS (80 MG) BY MOUTH IN THE MORNING AND 3 TABLETS (60 MG) EVERY EVENING. (Patient  taking differently: Take 60-80 mg by mouth See admin instructions. Take 80 mg in the morning and 60 mg in the evening) 630 tablet 3   Glucagon (GVOKE HYPOPEN 2-PACK) 0.5 MG/0.1ML SOAJ INJECT 0.5 MG (1 PEN) INTO THE SKIN DAILY AS NEEDED. 2 mL 11   guaiFENesin-dextromethorphan (ROBITUSSIN DM) 100-10 MG/5ML syrup Take 10 mLs by mouth every 6 (six) hours as needed for cough. 118 mL 0   hydrALAZINE (APRESOLINE) 100 MG tablet Take 1 tablet (100 mg total) by mouth 3 (three) times daily. 270 tablet 3   hydrocortisone cream 1 % Apply 1 application topically daily as needed for itching.      insulin glargine, 1 Unit Dial, (TOUJEO) 300 UNIT/ML Solostar Pen Inject 20 Units into the skin at bedtime.     insulin lispro (HUMALOG KWIKPEN) 100 UNIT/ML KwikPen Inject 0.02-0.04 mLs (2-4 Units total) into the skin 3 (three) times daily as needed (high blood sugar over 150). (Patient taking differently: Inject 0-10 Units into the skin See  admin instructions. Sliding scale Inject  (high blood sugar over 150).  150-199=2 units 200-249=4 units 250-299=6 units 300-349=8 units 350 above 10 units Over 400 call physician) 15 mL 2   Isopropyl Alcohol (ALCOHOL WIPES) 70 % MISC Apply 1 each topically 2 (two) times daily. 300 each 3   isosorbide mononitrate (IMDUR) 30 MG 24 hr tablet Take 1 tablet (30 mg total) by mouth daily. 90 tablet 3   lamoTRIgine (LAMICTAL) 150 MG tablet Take 1 tablet (150 mg total) by mouth 2 (two) times daily. 180 tablet 3   levETIRAcetam (KEPPRA) 500 MG tablet Take 2 tablets every night 180 tablet 3   loratadine (CLARITIN) 10 MG tablet Take 1 tablet (10 mg total) by mouth daily. 30 tablet 2   ondansetron (ZOFRAN) 4 MG tablet Take 1 tablet (4 mg total) by mouth every 8 (eight) hours as needed for nausea or vomiting. 20 tablet 1   OZEMPIC, 1 MG/DOSE, 4 MG/3ML SOPN INJECT 1MG  UNDER THE SKIN ONE TIME WEEKLY 9 mL 3   Pancrelipase, Lip-Prot-Amyl, 25000-79000 units CPEP Take 1-2 capsules by mouth See admin instructions. Take 2 capsules in the morning with breakfast, 1 capsule with lunch, and 2 capsules with supper     potassium chloride SA (KLOR-CON M) 20 MEQ tablet Take 2 tablets (40 mEq total) by mouth daily. With additional on metolazone days 300 tablet 3   sennosides-docusate sodium (SENOKOT-S) 8.6-50 MG tablet Take 1-2 tablets by mouth at bedtime as needed for constipation.     sertraline (ZOLOFT) 50 MG tablet TAKE 1 TABLET EVERY DAY 90 tablet 2   tiZANidine (ZANAFLEX) 2 MG tablet TAKE 1 TABLET AT BEDTIME AS NEEDED 30 tablet 3   tolnaftate (TINACTIN) 1 % cream Apply 1 application topically daily as needed (foot fungus).      TRUE METRIX BLOOD GLUCOSE TEST test strip USE AS DIRECTED TO CHECK BLOOD SUGARS 2 TIMES PER DAY 200 strip 10   TRUEplus Lancets 33G MISC TEST BLOOD SUGAR TWICE DAILY AS DIRECTED 200 each 3   No current facility-administered medications for this visit.   Facility-Administered  Medications Ordered in Other Visits  Medication Dose Route Frequency Provider Last Rate Last Admin   0.9 %  sodium chloride infusion (Manually program via Guardrails IV Fluids)   Intravenous Once Milton, Anderson Malta, FNP       0.9 %  sodium chloride infusion (Manually program via Guardrails IV Fluids)   Intravenous Once [place], Anderson Malta, FNP  REVIEW OF SYSTEMS:   Review of Systems  Constitutional: Positive for fatigue.  Positive for cold intolerance.  Negative for appetite change, chills, fever and unexpected weight change.  HENT: Negative for mouth sores, nosebleeds, sore throat and trouble swallowing.   Eyes: Negative for eye problems and icterus.  Respiratory: Positive for baseline cough and dyspnea on exertion.  Negative for  hemoptysis and wheezing.   Cardiovascular: Positive for bilateral lower extremity swelling.  Negative for chest pain. Gastrointestinal: Negative for abdominal pain, constipation, diarrhea, nausea and vomiting.  Genitourinary: Negative for bladder incontinence, difficulty urinating, dysuria, frequency and hematuria.   Musculoskeletal: Negative for back pain, gait problem, neck pain and neck stiffness.  Skin: Negative for itching and rash.  Neurological: Negative for dizziness, extremity weakness, gait problem, headaches, light-headedness and seizures.  Hematological: Negative for adenopathy. Does not bruise/bleed easily.  Psychiatric/Behavioral: Negative for confusion, depression and sleep disturbance. The patient is not nervous/anxious.     PHYSICAL EXAMINATION:  Blood pressure (!) 132/57, pulse 61, temperature (!) 97.5 F (36.4 C), temperature source Temporal, resp. rate 13, weight 179 lb 11.2 oz (81.5 kg), SpO2 96 %.  ECOG PERFORMANCE STATUS: 1  Physical Exam  Constitutional: Oriented to person, place, and time and well-developed, well-nourished, and in no distress.  HENT:  Head: Normocephalic and atraumatic.  Mouth/Throat: Oropharynx is clear and  moist. No oropharyngeal exudate.  Eyes: Conjunctivae are normal. Right eye exhibits no discharge. Left eye exhibits no discharge. No scleral icterus.  Neck: Normal range of motion. Neck supple.  Cardiovascular: Normal rate, regular rhythm, positive for mumur and intact distal pulses.   Pulmonary/Chest: Effort normal and breath sounds normal. No respiratory distress. No wheezes. No rales.  Abdominal: Soft. Bowel sounds are normal. Exhibits no distension and no mass. There is no tenderness.  Musculoskeletal: Normal range of motion. Bilateral lower extremity pitting edema.  Lymphadenopathy:    No cervical adenopathy.  Neurological: Alert and oriented to person, place, and time. Exhibits normal muscle tone. Ambulates with a cane Skin: Skin is warm and dry. No rash noted. Not diaphoretic. No erythema. No pallor.  Psychiatric: Mood, memory and judgment normal.  Vitals reviewed.  LABORATORY DATA: Lab Results  Component Value Date   WBC 4.5 10/22/2022   HGB 8.3 (L) 10/22/2022   HCT 27.5 (L) 10/22/2022   MCV 73.1 (L) 10/22/2022   PLT 374 10/22/2022      Chemistry      Component Value Date/Time   NA 140 10/22/2022 1323   NA 140 06/14/2022 1658   K 3.3 (L) 10/22/2022 1323   CL 103 10/22/2022 1323   CO2 28 10/22/2022 1323   BUN 14 10/22/2022 1323   BUN 18 06/14/2022 1658   CREATININE 0.95 10/22/2022 1323   CREATININE 0.91 10/04/2016 1144      Component Value Date/Time   CALCIUM 9.2 10/22/2022 1323   ALKPHOS 112 10/22/2022 1323   AST 19 10/22/2022 1323   ALT 16 10/22/2022 1323   BILITOT 0.3 10/22/2022 1323       RADIOGRAPHIC STUDIES: DG Chest 2 View  Result Date: 10/09/2022 CLINICAL DATA:  Shortness of breath EXAM: CHEST - 2 VIEW COMPARISON:  07/05/2022 FINDINGS: Cardiomegaly. Mild diffuse interstitial opacity. Disc degenerative disease of the thoracic spine. IMPRESSION: Cardiomegaly with mild diffuse interstitial opacity, likely edema. No focal airspace opacity. Electronically  Signed   By: Jearld Lesch M.D.   On: 10/09/2022 10:19     ASSESSMENT/PLAN:  This is a very pleasant 77 year old African-American female referred  to the clinic for iron deficiency anemia.  This patient had several lab studies performed today including a CBC, CMP, iron studies, ferritin, SPEP, B12, folate, and sample blood bank.  The patient's labs from today demonstrated anemia with a hemoglobin of 8.4.  Her MCV is low.  Her iron studies show low iron and saturation.  Her other lab studies are pending.  We will arrange for IV iron infusions with ferrlicit 250 mg weekly x 4 at the [facility] infusion center.  The patient will continue taking her iron supplement 1 tablet p.o. daily with vitamin C to help absorption.  She was also given a handout of iron rich food.  She was instructed to increase her dietary intake of iron rich food. I sent her a prescription of integra plus since she ran out of her prescription iron.   She completed her stool cards with her PCP last week. Dr. Arbutus Ped recommends following up with Dr. Dulce Sellar to see if she needs some type of endoscopic evaluation.   Will see her back for follow-up visit in 2 months for evaluation repeat blood work.  Reports that she develops any new or worsening symptoms of anemia in the interval, she is advised to call sooner.  The patient voices understanding of current disease status and treatment options and is in agreement with the current care plan.  All questions were answered. The patient knows to call the clinic with any problems, questions or concerns. We can certainly see the patient much sooner if necessary.  Thank you so much for allowing me to participate in the care of [name]. I will continue to follow up the patient with you and assist in her care.   Disclaimer: This note was dictated with voice recognition software. Similar sounding words can inadvertently be transcribed and may not be corrected upon  review.   Evertte Sones L Sawyer Kahan October 22, 2022, 2:56 PM  ADDENDUM: Hematology/Oncology Attending: I had a face-to-face encounter with the patient today.  I reviewed her records, lab and recommended her care plan.  This is a very pleasant 77 years old African-American female with history of lower GI bleeding in addition to history of pulmonary fibrosis, seizure disorder, dyslipidemia, obstructive sleep apnea, congestive heart failure, depression as well as diabetes mellitus.  The patient was seen recently by her primary care physician for evaluation of her diabetes and her routine blood work showed worsening microcytic anemia with hemoglobin of 7.0 and MCV of 72.  She had iron study at that time that showed low serum iron 14 with ferritin level of 20 and iron saturation 4%. The patient had previous evaluation by Dr. Dulce Sellar for her anemia and her colonoscopy at that point showed internal and external hemorrhoids. She was referred to [place] today for evaluation and recommendation regarding her condition.  She has a history of anemia that has been going on for several years now. Will order several studies today including repeat CBC that showed hemoglobin of 8.3 and hematocrit 27.5% with MCV of 73.1.  Iron studies showed low serum iron of 22 with iron saturation of 5%. Serum folate, vitamin B12, ferritin and serum protein electrophoresis are still pending. I recommended for the patient to proceed with iron infusion with Ferrlecit 200 Mg IV weekly for 4 weeks. We recommended for the patient to see her gastroenterologist Dr. Dulce Sellar for reevaluation and to rule out any other gastrointestinal blood loss. The patient will come back for follow-up visit in 2 months for evaluation  and repeat blood work. She was advised to call immediately if she has any other concerning symptoms in the interval. The total time spent in the appointment was 60 minutes. Disclaimer: This note was dictated with voice recognition  software. Similar sounding words can inadvertently be transcribed and may be missed upon review. Lajuana Matte, MD

## 2022-10-22 ENCOUNTER — Inpatient Hospital Stay: Payer: Medicare HMO | Attending: Physician Assistant | Admitting: Physician Assistant

## 2022-10-22 ENCOUNTER — Inpatient Hospital Stay: Payer: Medicare HMO

## 2022-10-22 ENCOUNTER — Other Ambulatory Visit: Payer: Self-pay | Admitting: Internal Medicine

## 2022-10-22 ENCOUNTER — Other Ambulatory Visit: Payer: Self-pay | Admitting: Physician Assistant

## 2022-10-22 ENCOUNTER — Other Ambulatory Visit: Payer: Self-pay

## 2022-10-22 VITALS — BP 132/57 | HR 61 | Temp 97.5°F | Resp 13 | Wt 179.7 lb

## 2022-10-22 DIAGNOSIS — I110 Hypertensive heart disease with heart failure: Secondary | ICD-10-CM | POA: Diagnosis not present

## 2022-10-22 DIAGNOSIS — E1159 Type 2 diabetes mellitus with other circulatory complications: Secondary | ICD-10-CM | POA: Insufficient documentation

## 2022-10-22 DIAGNOSIS — K644 Residual hemorrhoidal skin tags: Secondary | ICD-10-CM | POA: Insufficient documentation

## 2022-10-22 DIAGNOSIS — Z7901 Long term (current) use of anticoagulants: Secondary | ICD-10-CM | POA: Insufficient documentation

## 2022-10-22 DIAGNOSIS — D509 Iron deficiency anemia, unspecified: Secondary | ICD-10-CM

## 2022-10-22 DIAGNOSIS — Z79899 Other long term (current) drug therapy: Secondary | ICD-10-CM | POA: Insufficient documentation

## 2022-10-22 DIAGNOSIS — G4733 Obstructive sleep apnea (adult) (pediatric): Secondary | ICD-10-CM

## 2022-10-22 DIAGNOSIS — F339 Major depressive disorder, recurrent, unspecified: Secondary | ICD-10-CM

## 2022-10-22 DIAGNOSIS — I10 Essential (primary) hypertension: Secondary | ICD-10-CM

## 2022-10-22 DIAGNOSIS — D500 Iron deficiency anemia secondary to blood loss (chronic): Secondary | ICD-10-CM

## 2022-10-22 DIAGNOSIS — K648 Other hemorrhoids: Secondary | ICD-10-CM | POA: Diagnosis not present

## 2022-10-22 DIAGNOSIS — Z803 Family history of malignant neoplasm of breast: Secondary | ICD-10-CM | POA: Insufficient documentation

## 2022-10-22 DIAGNOSIS — I509 Heart failure, unspecified: Secondary | ICD-10-CM | POA: Insufficient documentation

## 2022-10-22 DIAGNOSIS — Z87891 Personal history of nicotine dependence: Secondary | ICD-10-CM | POA: Diagnosis not present

## 2022-10-22 LAB — CBC WITH DIFFERENTIAL (CANCER CENTER ONLY)
Abs Immature Granulocytes: 0 10*3/uL (ref 0.00–0.07)
Basophils Absolute: 0 10*3/uL (ref 0.0–0.1)
Basophils Relative: 1 %
Eosinophils Absolute: 0.2 10*3/uL (ref 0.0–0.5)
Eosinophils Relative: 4 %
HCT: 27.5 % — ABNORMAL LOW (ref 36.0–46.0)
Hemoglobin: 8.3 g/dL — ABNORMAL LOW (ref 12.0–15.0)
Immature Granulocytes: 0 %
Lymphocytes Relative: 26 %
Lymphs Abs: 1.2 10*3/uL (ref 0.7–4.0)
MCH: 22.1 pg — ABNORMAL LOW (ref 26.0–34.0)
MCHC: 30.2 g/dL (ref 30.0–36.0)
MCV: 73.1 fL — ABNORMAL LOW (ref 80.0–100.0)
Monocytes Absolute: 0.4 10*3/uL (ref 0.1–1.0)
Monocytes Relative: 9 %
Neutro Abs: 2.8 10*3/uL (ref 1.7–7.7)
Neutrophils Relative %: 60 %
Platelet Count: 374 10*3/uL (ref 150–400)
RBC: 3.76 MIL/uL — ABNORMAL LOW (ref 3.87–5.11)
RDW: 20.4 % — ABNORMAL HIGH (ref 11.5–15.5)
WBC Count: 4.5 10*3/uL (ref 4.0–10.5)
nRBC: 0 % (ref 0.0–0.2)

## 2022-10-22 LAB — CMP (CANCER CENTER ONLY)
ALT: 16 U/L (ref 0–44)
AST: 19 U/L (ref 15–41)
Albumin: 4.1 g/dL (ref 3.5–5.0)
Alkaline Phosphatase: 112 U/L (ref 38–126)
Anion gap: 9 (ref 5–15)
BUN: 14 mg/dL (ref 8–23)
CO2: 28 mmol/L (ref 22–32)
Calcium: 9.2 mg/dL (ref 8.9–10.3)
Chloride: 103 mmol/L (ref 98–111)
Creatinine: 0.95 mg/dL (ref 0.44–1.00)
GFR, Estimated: >60 mL/min (ref >60)
Glucose, Bld: 132 mg/dL — ABNORMAL HIGH (ref 70–99)
Potassium: 3.3 mmol/L — ABNORMAL LOW (ref 3.5–5.1)
Sodium: 140 mmol/L (ref 135–145)
Total Bilirubin: 0.3 mg/dL (ref 0.3–1.2)
Total Protein: 7.8 g/dL (ref 6.5–8.1)

## 2022-10-22 LAB — FERRITIN: Ferritin: 8 ng/mL — ABNORMAL LOW (ref 11–307)

## 2022-10-22 LAB — IRON AND IRON BINDING CAPACITY (CC-WL,HP ONLY)
Iron: 22 ug/dL — ABNORMAL LOW (ref 28–170)
Saturation Ratios: 5 % — ABNORMAL LOW (ref 10.4–31.8)
TIBC: 458 ug/dL — ABNORMAL HIGH (ref 250–450)
UIBC: 436 ug/dL (ref 148–442)

## 2022-10-22 LAB — VITAMIN B12: Vitamin B-12: 320 pg/mL (ref 180–914)

## 2022-10-22 LAB — FOLATE: Folate: 11.9 ng/mL (ref >5.9)

## 2022-10-22 LAB — SAMPLE TO BLOOD BANK

## 2022-10-22 MED ORDER — INTEGRA PLUS PO CAPS
1.0000 | ORAL_CAPSULE | Freq: Every morning | ORAL | 2 refills | Status: DC
Start: 2022-10-22 — End: 2024-02-07

## 2022-10-24 ENCOUNTER — Telehealth: Payer: Self-pay | Admitting: Physician Assistant

## 2022-10-25 LAB — PROTEIN ELECTROPHORESIS, SERUM, WITH REFLEX
A/G Ratio: 1 (ref 0.7–1.7)
Albumin ELP: 3.7 g/dL (ref 2.9–4.4)
Alpha-1-Globulin: 0.3 g/dL (ref 0.0–0.4)
Alpha-2-Globulin: 0.7 g/dL (ref 0.4–1.0)
Beta Globulin: 1.1 g/dL (ref 0.7–1.3)
Gamma Globulin: 1.7 g/dL (ref 0.4–1.8)
Globulin, Total: 3.8 g/dL (ref 2.2–3.9)
Total Protein ELP: 7.5 g/dL (ref 6.0–8.5)

## 2022-10-26 ENCOUNTER — Inpatient Hospital Stay: Payer: Medicare HMO

## 2022-11-01 DIAGNOSIS — M542 Cervicalgia: Secondary | ICD-10-CM | POA: Diagnosis not present

## 2022-11-01 DIAGNOSIS — M79641 Pain in right hand: Secondary | ICD-10-CM | POA: Diagnosis not present

## 2022-11-01 DIAGNOSIS — M67331 Transient synovitis, right wrist: Secondary | ICD-10-CM | POA: Diagnosis not present

## 2022-11-02 ENCOUNTER — Inpatient Hospital Stay: Payer: Medicare HMO | Attending: Physician Assistant

## 2022-11-02 ENCOUNTER — Other Ambulatory Visit: Payer: Self-pay

## 2022-11-02 VITALS — BP 134/60 | HR 61 | Temp 98.2°F | Resp 17

## 2022-11-02 DIAGNOSIS — D509 Iron deficiency anemia, unspecified: Secondary | ICD-10-CM | POA: Insufficient documentation

## 2022-11-02 DIAGNOSIS — D500 Iron deficiency anemia secondary to blood loss (chronic): Secondary | ICD-10-CM

## 2022-11-02 MED ORDER — SODIUM CHLORIDE 0.9 % IV SOLN: Freq: Once | INTRAVENOUS | Status: AC

## 2022-11-02 MED ORDER — SODIUM CHLORIDE 0.9 % IV SOLN
250.0000 mg | Freq: Once | INTRAVENOUS | Status: AC
  Administered 2022-11-02: 250 mg via INTRAVENOUS
  Filled 2022-11-02: qty 20

## 2022-11-02 MED ORDER — ACETAMINOPHEN 325 MG PO TABS
650.0000 mg | ORAL_TABLET | Freq: Once | ORAL | Status: AC
  Administered 2022-11-02: 650 mg via ORAL
  Filled 2022-11-02: qty 2

## 2022-11-02 MED ORDER — CETIRIZINE HCL 10 MG PO TABS
10.0000 mg | ORAL_TABLET | Freq: Once | ORAL | Status: AC
  Administered 2022-11-02: 10 mg via ORAL
  Filled 2022-11-02: qty 1

## 2022-11-02 NOTE — Patient Instructions (Signed)
Sodium Ferric Gluconate Complex Injection What is this medication? SODIUM FERRIC GLUCONATE COMPLEX (SOE dee um FER ik GLOO koe nate KOM pleks) treats low levels of iron (iron deficiency anemia) in people with kidney disease. Iron is a mineral that plays an important role in making red blood cells, which carry oxygen from your lungs to the rest of your body. This medicine may be used for other purposes; ask your health care provider or pharmacist if you have questions. COMMON BRAND NAME(S): Ferrlecit, Nulecit What should I tell my care team before I take this medication? They need to know if you have any of the following conditions: Anemia that is not from iron deficiency High levels of iron in the blood An unusual or allergic reaction to iron, other medications, foods, dyes, or preservatives Pregnant or are trying to become pregnant Breast-feeding How should I use this medication? This medication is injected into a vein. It is given by your care team in a hospital or clinic setting. Talk to your care team about the use of this medication in children. While it may be prescribed for children as young as 6 years for selected conditions, precautions do apply. Overdosage: If you think you have taken too much of this medicine contact a poison control center or emergency room at once. NOTE: This medicine is only for you. Do not share this medicine with others. What if I miss a dose? It is important not to miss your dose. Call your care team if you are unable to keep an appointment. What may interact with this medication? Do not take this medication with any of the following: Deferasirox Deferoxamine Dimercaprol This medication may also interact with the following: Other iron products This list may not describe all possible interactions. Give your health care provider a list of all the medicines, herbs, non-prescription drugs, or dietary supplements you use. Also tell them if you smoke, drink  alcohol, or use illegal drugs. Some items may interact with your medicine. What should I watch for while using this medication? Your condition will be monitored carefully while you are receiving this medication. Visit your care team for regular checks on your progress. You may need blood work while you are taking this medication. What side effects may I notice from receiving this medication? Side effects that you should report to your care team as soon as possible: Allergic reactions--skin rash, itching, hives, swelling of the face, lips, tongue, or throat Low blood pressure--dizziness, feeling faint or lightheaded, blurry vision Shortness of breath Side effects that usually do not require medical attention (report to your care team if they continue or are bothersome): Flushing Headache Joint pain Muscle pain Nausea Pain, redness, or irritation at injection site This list may not describe all possible side effects. Call your doctor for medical advice about side effects. You may report side effects to FDA at 1-800-FDA-1088. Where should I keep my medication? This medication is given in a hospital or clinic and will not be stored at home. NOTE: This sheet is a summary. It may not cover all possible information. If you have questions about this medicine, talk to your doctor, pharmacist, or health care provider.  2023 [employer] (2020-11-04 00:00:00)  

## 2022-11-07 ENCOUNTER — Telehealth: Payer: Self-pay

## 2022-11-07 NOTE — Progress Notes (Signed)
11-07-2022: Left VM to schedule appointment with Glennon Hamilton. 11-12-2022: Patient was scheduled with Cherylin Mylar.  Huey Romans [facility] Clinical Pharmacist Assistant [phone]

## 2022-11-08 DIAGNOSIS — M67331 Transient synovitis, right wrist: Secondary | ICD-10-CM | POA: Diagnosis not present

## 2022-11-08 DIAGNOSIS — M542 Cervicalgia: Secondary | ICD-10-CM | POA: Diagnosis not present

## 2022-11-08 DIAGNOSIS — M79641 Pain in right hand: Secondary | ICD-10-CM | POA: Diagnosis not present

## 2022-11-09 ENCOUNTER — Inpatient Hospital Stay: Payer: Medicare HMO

## 2022-11-09 ENCOUNTER — Other Ambulatory Visit: Payer: Self-pay

## 2022-11-09 VITALS — BP 149/77 | HR 59 | Temp 98.1°F | Resp 18

## 2022-11-09 DIAGNOSIS — D500 Iron deficiency anemia secondary to blood loss (chronic): Secondary | ICD-10-CM

## 2022-11-09 DIAGNOSIS — D509 Iron deficiency anemia, unspecified: Secondary | ICD-10-CM | POA: Diagnosis not present

## 2022-11-09 MED ORDER — SODIUM CHLORIDE 0.9 % IV SOLN: Freq: Once | INTRAVENOUS | Status: AC

## 2022-11-09 MED ORDER — CETIRIZINE HCL 10 MG PO TABS
10.0000 mg | ORAL_TABLET | Freq: Once | ORAL | Status: AC
  Administered 2022-11-09: 10 mg via ORAL
  Filled 2022-11-09: qty 1

## 2022-11-09 MED ORDER — ACETAMINOPHEN 325 MG PO TABS
650.0000 mg | ORAL_TABLET | Freq: Once | ORAL | Status: AC
  Administered 2022-11-09: 650 mg via ORAL
  Filled 2022-11-09: qty 2

## 2022-11-09 MED ORDER — SODIUM CHLORIDE 0.9 % IV SOLN
250.0000 mg | Freq: Once | INTRAVENOUS | Status: AC
  Administered 2022-11-09: 250 mg via INTRAVENOUS
  Filled 2022-11-09: qty 20

## 2022-11-09 NOTE — Progress Notes (Signed)
Pt declined 30 min observation period post iron infusion. VSS at d/c. Pt ambulated to lobby.

## 2022-11-09 NOTE — Patient Instructions (Signed)
Sodium Ferric Gluconate Complex Injection What is this medication? SODIUM FERRIC GLUCONATE COMPLEX (SOE dee um FER ik GLOO koe nate KOM pleks) treats low levels of iron (iron deficiency anemia) in people with kidney disease. Iron is a mineral that plays an important role in making red blood cells, which carry oxygen from your lungs to the rest of your body. This medicine may be used for other purposes; ask your health care provider or pharmacist if you have questions. COMMON BRAND NAME(S): Ferrlecit, Nulecit What should I tell my care team before I take this medication? They need to know if you have any of the following conditions: Anemia that is not from iron deficiency High levels of iron in the blood An unusual or allergic reaction to iron, other medications, foods, dyes, or preservatives Pregnant or are trying to become pregnant Breast-feeding How should I use this medication? This medication is injected into a vein. It is given by your care team in a hospital or clinic setting. Talk to your care team about the use of this medication in children. While it may be prescribed for children as young as 6 years for selected conditions, precautions do apply. Overdosage: If you think you have taken too much of this medicine contact a poison control center or emergency room at once. NOTE: This medicine is only for you. Do not share this medicine with others. What if I miss a dose? It is important not to miss your dose. Call your care team if you are unable to keep an appointment. What may interact with this medication? Do not take this medication with any of the following: Deferasirox Deferoxamine Dimercaprol This medication may also interact with the following: Other iron products This list may not describe all possible interactions. Give your health care provider a list of all the medicines, herbs, non-prescription drugs, or dietary supplements you use. Also tell them if you smoke, drink  alcohol, or use illegal drugs. Some items may interact with your medicine. What should I watch for while using this medication? Your condition will be monitored carefully while you are receiving this medication. Visit your care team for regular checks on your progress. You may need blood work while you are taking this medication. What side effects may I notice from receiving this medication? Side effects that you should report to your care team as soon as possible: Allergic reactions--skin rash, itching, hives, swelling of the face, lips, tongue, or throat Low blood pressure--dizziness, feeling faint or lightheaded, blurry vision Shortness of breath Side effects that usually do not require medical attention (report to your care team if they continue or are bothersome): Flushing Headache Joint pain Muscle pain Nausea Pain, redness, or irritation at injection site This list may not describe all possible side effects. Call your doctor for medical advice about side effects. You may report side effects to FDA at 1-800-FDA-1088. Where should I keep my medication? This medication is given in a hospital or clinic and will not be stored at home. NOTE: This sheet is a summary. It may not cover all possible information. If you have questions about this medicine, talk to your doctor, pharmacist, or health care provider.  2023 [employer] (2020-11-04 00:00:00)  

## 2022-11-15 ENCOUNTER — Ambulatory Visit: Payer: Medicare HMO | Attending: Cardiovascular Disease | Admitting: Cardiovascular Disease

## 2022-11-15 ENCOUNTER — Encounter: Payer: Self-pay | Admitting: Cardiovascular Disease

## 2022-11-15 VITALS — BP 110/70 | HR 63 | Ht 61.0 in | Wt 179.2 lb

## 2022-11-15 DIAGNOSIS — Q211 Atrial septal defect, unspecified: Secondary | ICD-10-CM

## 2022-11-15 NOTE — Progress Notes (Signed)
Cardiology Office Note:    Date:  11/16/2022   ID:  [name], DOB August 08, 1945, MRN [id number]  PCP:  [name], MD   [facility] Providers Cardiologist:  Bryan Lemma, MD     Referring MD: [name], MD   Chief Complaint  Patient presents with   Follow-up    Atrial septal defect    History of Present Illness:    [name] is a 77 y.o. female presenting for follow-up of atrial septal defect.  She was seen October 08, 2018 for an outpatient consultation.  She had undergone a transesophageal echo for cardioversion of atypical atrial flutter in February 2024.  This demonstrated a moderate-sized atrial septal defect with continuous left-to-right flow.  She underwent cardioversion and has been anticoagulated with apixaban.  Right heart catheterization showed a QP/QS of 1.5/1 with an oxygen saturations step up from 64% in the SVC to 77% in the pulmonary artery.  RV size is mildly enlarged on echo assessment.  The patient is here with her sister, [name] today.  She reports no significant change in symptoms.  She continues to have some fatigue and shortness of breath with exertion.  She has left ankle edema but no pretibial edema.  No orthopnea, PND, or chest pain.  She has been evaluated in the hematology clinic and has undergone full consultation.  She is in the process of receiving iron infusions.  She remains on apixaban for anticoagulation.  Past Medical History:  Diagnosis Date   Abnormal liver function     in the past.   Anemia    Arthritis    all over   Bruises easily    Cataract    right eye;immature   CHF (congestive heart failure) (HCC)    Chronic back pain    stenosis   Chronic cough    Chronic kidney disease    COPD (chronic obstructive pulmonary disease) (HCC)    COVID    aug/sept 2021   Demand myocardial infarction [facility]) 2012   Demand Infarction in setting of Pancreatitis --> mild Troponin elevation, NON-OBSTRUCTIVE CAD   Depression     takes Abilify daily as well as Zoloft   Diastolic heart failure 2010   Grade 1 diastolic Dysfunction by Echo    Diverticulosis    DM (diabetes mellitus) (HCC)    takes Victoza daily as well as Lantus and Humalog   Empty sella (HCC)    on MRI in 2009.   Glaucoma    Headache(784.0)    last migraine-4-31yrs ago   History of blood transfusion    no abnormal reaction noted   History of colon polyps    benign   HTN (hypertension)    takes Benicar,Imdur,and Bystolic daily   Hyperlipidemia    takes Lipitor daily   Joint swelling    Nocturia    Obesity hypoventilation syndrome (HCC)    Obstructive sleep apnea 02/2018   Notably improved split-night study with weight loss from 270 (2017) down to 250 pounds (2019).   Pancreatitis    takes Pancrelipase daily   Parkinson's disease    takes Sinemet daily   Peripheral neuropathy    Pneumonia 2012   Seizures (HCC)    takes Lamictal daily and Primidone nightly;last seizure 2wks ago   Urinary urgency    With increased frequency   Varicose veins of both lower extremities with pain    With edema.  Takes daily Lasix    Past Surgical History:  Procedure  Laterality Date   ABDOMINAL HYSTERECTOMY     APPENDECTOMY     BACK SURGERY     Cardiac Event Monitor  September-October 2017   Sinus rhythm with occasional PACs and artifact. No arrhythmias besides one short run of tachycardia.   CARDIOVERSION N/A 08/15/2022   Procedure: CARDIOVERSION;  Surgeon: Laurey Morale, MD;  Location: [facility] ENDOSCOPY;  Service: Cardiovascular;  Laterality: N/A;   CHOLECYSTECTOMY     COLONOSCOPY N/A 08/15/2012   Procedure: COLONOSCOPY;  Surgeon: Willis Modena, MD;  Location: [facility] ENDOSCOPY;  Service: Endoscopy;  Laterality: N/A;   COLONOSCOPY WITH PROPOFOL Left 06/17/2017   Procedure: COLONOSCOPY WITH PROPOFOL;  Surgeon: Kerin Salen, MD;  Location: [facility] ENDOSCOPY;  Service: Gastroenterology;  Laterality: Left;   COLONOSCOPY WITH PROPOFOL N/A 04/03/2021   Procedure:  COLONOSCOPY WITH PROPOFOL;  Surgeon: Vida Rigger, MD;  Location: [facility] ENDOSCOPY;  Service: Endoscopy;  Laterality: N/A;   CORONARY CALCIUM SCORE AND CTA  04/2018   Coronary calcium score 71.9.  Very large, hyperdynamic LAD wrapping apex giving rise to PDA.  Moderate LAD-diagonal and circumflex plaque -> CT FFR suggested positive findings in distal D1, D2 and distal circumflex.  Referred for cath. ==> FALSE POSITIVE   ESOPHAGOGASTRODUODENOSCOPY     eye cysts Bilateral    LASIK     LEFT HEART CATH AND CORONARY ANGIOGRAPHY N/A 05/16/2018   Procedure: LEFT HEART CATH AND CORONARY ANGIOGRAPHY;  Surgeon: Lyn Records, MD;  Location: [facility] INVASIVE CV LAB:  Angiographically normal coronary arteries with LVEDP of 21 mmHg.  Large draping hyperdominant LAD that wraps the apex and provides distal half of the PDA.  Relatively small caliber distal Cx -->proxLPDA, non-dom RCA. No Cx or Diag lesions -- FALSE + CT FFR   LEFT HEART CATH AND CORONARY ANGIOGRAPHY  2009/March 2012   2009: (Dr. Janene Harvey) Nonobstructive CAD; 2012: Minimal CAD --> false positive stress test   NM MYOVIEW LTD  01/2016; 01/2018   a)LOW RISK. No ischemia or infarction;; b) EF 55-60%. NO ST changes.  No ischemia or Infarction. NO significant RV enlargement.  LOW RISK.   POLYPECTOMY  04/03/2021   Procedure: POLYPECTOMY;  Surgeon: Vida Rigger, MD;  Location: [facility] ENDOSCOPY;  Service: Endoscopy;;   Polysomnogram  02/2018   (Dr. Vickey Huger from neurology): Split study was not adequately done due to low AHI.  She slept reclined and had an AHI less than 10, prolonged hypoxemia and no hypercapnia. -->  She has home oxygen already prescribed, but did not meet criteria for nightly oxygen.  (Was noted that the patient did not cooperate well with study.  Titration was discussed.   PRESSURE SENSOR/CARDIOMEMS N/A 11/09/2019   Procedure: PRESSURE SENSOR/CARDIOMEMS;  Surgeon: Laurey Morale, MD;  Location: [facility] INVASIVE CV LAB;  Service: Cardiovascular;  Laterality:  N/A;   RECTAL POLYPECTOMY     RIGHT HEART CATH N/A 09/07/2022   Procedure: RIGHT HEART CATH;  Surgeon: Laurey Morale, MD;  Location: [facility] INVASIVE CV LAB;  Service: Cardiovascular;  Laterality: N/A;   RIGHT/LEFT HEART CATH AND CORONARY ANGIOGRAPHY N/A 04/06/2019   Procedure: RIGHT/LEFT HEART CATH AND CORONARY ANGIOGRAPHY;  Surgeon: Marykay Lex, MD;  Location: [facility] INVASIVE CV LAB; angiographically normal coronary arteries.  Moderately elevated AD LVEDP..  Mild pulmonary hypertension: PA P 56/14 mmHg-mean 31 mmHg.  RAP 10 mmHg.  RV P-EDP 54/4 mmHg - 12 mmHg.  PCWP 11-15 mmHg.  CO-CI: 7.35-3.73.   TEE WITHOUT CARDIOVERSION N/A 08/15/2022   Procedure: TRANSESOPHAGEAL ECHOCARDIOGRAM (TEE);  Surgeon:  Laurey Morale, MD;  Location: [facility] ENDOSCOPY;  Service: Cardiovascular;  Laterality: N/A;   TONSILLECTOMY     TRANSTHORACIC ECHOCARDIOGRAM  01/2016; 05/2017   A) Normal LV chamber size with mod LVH pattern. EF 50-55%. Severe LA dilation. Mod RA dilation. PAP elevated at 38 mmHg;; B) Mild concentric LVH.  EF 55-60% & no RWMA.  Grade 2 DD.  Diastolic flattening of the ventricular septum == ? elevated PAP.  Mild aortic valve calcification/sclerosis.  Mod LA dilation.  Mild RV dilation & Mod RA dilation   TRANSTHORACIC ECHOCARDIOGRAM  10/2017; 01/2018   a) EF 55-60%.  No RWMA,  Moderate LA dilation.  Mild LA dilation.  Peak PA pressures ~57 mmHg (moderate);;; b) EF 55-60%.  GRII DD.  Suggestion of RV volume overload with moderate RV dilation.  Severe LA and RA dilation.Marland Kitchen  PA pressure ~67%.   TRANSTHORACIC ECHOCARDIOGRAM  01/2019   Normal LV size and function.  EF 50 to 55%.  Impaired relaxation.  RV appears to have moderately reduced function.  Severely enlarged.  Cannot fully assess RV pressures.  Hypermobile interatrial septum.  Right atrium severely dilated.   TUBAL LIGATION     vaginal cyst removed     several times    Current Medications: Current Meds  Medication Sig   acetaminophen (TYLENOL) 500 MG  tablet Take 500-1,000 mg by mouth every 4 (four) hours as needed for moderate pain.   albuterol (VENTOLIN HFA) 108 (90 Base) MCG/ACT inhaler Inhale 1-2 puffs into the lungs every 6 (six) hours as needed for wheezing or shortness of breath.   Alcohol Swabs (DROPSAFE ALCOHOL PREP) 70 % PADS USE TWO TIMES DAILY AS DIRECTED   apixaban (ELIQUIS) 5 MG TABS tablet Take 1 tablet (5 mg total) by mouth 2 (two) times daily.   Ascorbic Acid (VITAMIN C) 1000 MG tablet Take 1,000 mg by mouth daily.   atorvastatin (LIPITOR) 10 MG tablet TAKE 1 TABLET EVERY EVENING   B-D ULTRAFINE III SHORT PEN 31G X 8 MM MISC USE DAILY WITH VICTOZA AND/OR NOVOLOG.   benzonatate (TESSALON) 100 MG capsule TAKE 1 CAPSULE THREE TIMES DAILY AS NEEDED FOR COUGH   Blood Glucose Monitoring Suppl (TRUE METRIX METER) w/Device KIT USE AS DIRECTED   carvedilol (COREG) 12.5 MG tablet TAKE 1 TABLET TWICE DAILY   dexlansoprazole (DEXILANT) 60 MG capsule Take 60 mg by mouth daily.   diclofenac sodium (VOLTAREN) 1 % GEL Apply 2 g topically 4 (four) times daily as needed (pain).   FeFum-FePoly-FA-B Cmp-C-Biot (INTEGRA PLUS) CAPS Take 1 capsule by mouth every morning.   ferrous sulfate 324 MG TBEC Take 324 mg by mouth daily with breakfast.   furosemide (LASIX) 20 MG tablet TAKE 4 TABLETS (80 MG) BY MOUTH IN THE MORNING AND 3 TABLETS (60 MG) EVERY EVENING. (Patient taking differently: Take 60-80 mg by mouth See admin instructions. Take 80 mg in the morning and 60 mg in the evening)   Glucagon (GVOKE HYPOPEN 2-PACK) 0.5 MG/0.1ML SOAJ INJECT 0.5 MG (1 PEN) INTO THE SKIN DAILY AS NEEDED.   guaiFENesin-dextromethorphan (ROBITUSSIN DM) 100-10 MG/5ML syrup Take 10 mLs by mouth every 6 (six) hours as needed for cough.   hydrALAZINE (APRESOLINE) 100 MG tablet Take 1 tablet (100 mg total) by mouth 3 (three) times daily.   hydrocortisone cream 1 % Apply 1 application topically daily as needed for itching.    insulin glargine, 1 Unit Dial, (TOUJEO) 300  UNIT/ML Solostar Pen Inject 20 Units into the skin at  bedtime.   insulin lispro (HUMALOG KWIKPEN) 100 UNIT/ML KwikPen Inject 0.02-0.04 mLs (2-4 Units total) into the skin 3 (three) times daily as needed (high blood sugar over 150). (Patient taking differently: Inject 0-10 Units into the skin See admin instructions. Sliding scale Inject  (high blood sugar over 150).  150-199=2 units 200-249=4 units 250-299=6 units 300-349=8 units 350 above 10 units Over 400 call physician)   Isopropyl Alcohol (ALCOHOL WIPES) 70 % MISC Apply 1 each topically 2 (two) times daily.   isosorbide mononitrate (IMDUR) 30 MG 24 hr tablet Take 1 tablet (30 mg total) by mouth daily.   lamoTRIgine (LAMICTAL) 150 MG tablet Take 1 tablet (150 mg total) by mouth 2 (two) times daily.   levETIRAcetam (KEPPRA) 500 MG tablet Take 2 tablets every night   loratadine (CLARITIN) 10 MG tablet Take 1 tablet (10 mg total) by mouth daily.   ondansetron (ZOFRAN) 4 MG tablet Take 1 tablet (4 mg total) by mouth every 8 (eight) hours as needed for nausea or vomiting.   OZEMPIC, 1 MG/DOSE, 4 MG/3ML SOPN INJECT 1MG  UNDER THE SKIN ONE TIME WEEKLY   Pancrelipase, Lip-Prot-Amyl, 25000-79000 units CPEP Take 1-2 capsules by mouth See admin instructions. Take 2 capsules in the morning with breakfast, 1 capsule with lunch, and 2 capsules with supper   potassium chloride SA (KLOR-CON M) 20 MEQ tablet Take 2 tablets (40 mEq total) by mouth daily. With additional on metolazone days (Patient taking differently: Take 40 mEq by mouth daily. With additional on metolazone days. Per patient taking 2 tablets in the am and 1 tablet at night)   sennosides-docusate sodium (SENOKOT-S) 8.6-50 MG tablet Take 1-2 tablets by mouth at bedtime as needed for constipation.   sertraline (ZOLOFT) 50 MG tablet TAKE 1 TABLET EVERY DAY   tiZANidine (ZANAFLEX) 2 MG tablet TAKE 1 TABLET AT BEDTIME AS NEEDED   tolnaftate (TINACTIN) 1 % cream Apply 1 application  topically daily as needed (foot fungus).    TRUE METRIX BLOOD GLUCOSE TEST test strip USE AS DIRECTED TO CHECK BLOOD SUGARS 2 TIMES PER DAY   TRUEplus Lancets 33G MISC TEST BLOOD SUGAR TWICE DAILY AS DIRECTED   [DISCONTINUED] ARIPiprazole (ABILIFY) 10 MG tablet Take 10 mg by mouth daily.     Allergies:   Other, Penicillins, Sulfa antibiotics, Aspirin, and Codeine   Social History   Socioeconomic History   Marital status: Divorced    Spouse name: Not on file   Number of children: Not on file   Years of education: Not on file   Highest education level: Not on file  Occupational History   Occupation: Disabled    Comment: CNA  Tobacco Use   Smoking status: Former    Packs/day: 0.50    Years: 25.00    Additional pack years: 0.00    Total pack years: 12.50    Types: Cigarettes   Smokeless tobacco: Never   Tobacco comments:    quit smoking 20+ytrs ago  Vaping Use   Vaping Use: Never used  Substance and Sexual Activity   Alcohol use: No   Drug use: No   Sexual activity: Not Currently    Birth control/protection: Surgical  Other Topics Concern   Not on file  Social History Narrative   She lives in [place]. She has lots of family in the area and is accompanied by her sister.   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   She has a distant  history of smoking, quit 20 years ago.   Right handed    Social Determinants of Health   Financial Resource Strain: Low Risk  (07/18/2022)   Overall Financial Resource Strain (CARDIA)    Difficulty of Paying Living Expenses: Not hard at all  Food Insecurity: No Food Insecurity (07/18/2022)   Hunger Vital Sign    Worried About Running Out of Food in the Last Year: Never true    Ran Out of Food in the Last Year: Never true  Transportation Needs: No Transportation Needs (07/18/2022)   PRAPARE - Administrator, Civil Service (Medical): No    Lack of Transportation (Non-Medical): No  Physical  Activity: Inactive (07/18/2022)   Exercise Vital Sign    Days of Exercise per Week: 0 days    Minutes of Exercise per Session: 0 min  Stress: No Stress Concern Present (07/18/2022)   [employer] of Occupational Health - Occupational Stress Questionnaire    Feeling of Stress : Not at all  Social Connections: Not on file     Family History: The patient's family history includes Allergies in her father; Cancer in an other family member; Diabetes in her brother, mother, sister, and son; Heart disease in her father, mother, and sister; Heart failure in her father; Hyperlipidemia in her father, mother, and sister; Hypertension in her brother, father, mother, sister, sister, and son.  ROS:   Please see the history of present illness.    All other systems reviewed and are negative.  EKGs/Labs/Other Studies Reviewed:    The following studies were reviewed today: Cardiac Studies & Procedures   CARDIAC CATHETERIZATION  CARDIAC CATHETERIZATION 09/07/2022  Narrative 1. Normal filling pressures 2. Moderate pulmonary hypertension in setting of ASD 3. Qp/Qs 1.5/1  Suspect hemodynamically significant ASD.   CARDIAC CATHETERIZATION  CARDIAC CATHETERIZATION 11/09/2019  Narrative 1. Successful deployment of Cardiomems pressure sensor. 2. Normal RA pressure and PCWP. 3. Moderate pulmonary hypertension, PVR not significantly elevated.  Think PH is due to high cardiac output. 4. Despite high calculated cardiac output, no evidence for significant intracardiac shunt lesion.   STRESS TESTS  MYOCARDIAL PERFUSION IMAGING 01/29/2018  Narrative  The left ventricular ejection fraction is normal (55-65%).  Nuclear stress EF: 58%.  There was no ST segment deviation noted during stress.  The study is normal.  This is a low risk study.  Normal stress nuclear study with no ischemia or infarction.  Gated ejection fraction 58% with normal wall motion.  Note significant right ventricular  enlargement.  Suggest echocardiogram to further assess.   ECHOCARDIOGRAM  ECHOCARDIOGRAM COMPLETE 11/15/2020  Narrative ECHOCARDIOGRAM REPORT    Patient Name:   [name] [facility] Date of Exam: 11/15/2020 Medical Rec #:  [id number]       Height:       60.0 in Accession #:    [id number]      Weight:       198.2 lb Date of Birth:  [birth year]      BSA:          1.859 m Patient Age:    74 years        BP:           122/79 mmHg Patient Gender: F               HR:           77 bpm. Exam Location:  Inpatient  Procedure: 2D Echo, Cardiac Doppler and Color Doppler  Indications:  Dyspnea R06.00  History:        Patient has prior history of Echocardiogram examinations, most recent 03/21/2020. CHF, Previous Myocardial Infarction, COPD; Risk Factors:Hypertension, Diabetes, Sleep Apnea and Dyslipidemia. COVID-19. Seizures. CKD.  Sonographer:    Elmarie Shiley Dance Referring Phys: [id number] Kateri Mc LATIF SHEIKH  IMPRESSIONS   1. Left ventricular ejection fraction, by estimation, is 60 to 65%. The left ventricle has normal function. The left ventricle has no regional wall motion abnormalities. There is mild left ventricular hypertrophy. Left ventricular diastolic parameters are consistent with Grade I diastolic dysfunction (impaired relaxation). 2. Right ventricular systolic function is normal. The right ventricular size is normal. There is normal pulmonary artery systolic pressure. 3. Left atrial size was severely dilated. 4. The mitral valve is normal in structure. No evidence of mitral valve regurgitation. No evidence of mitral stenosis. 5. The aortic valve is normal in structure. There is mild calcification of the aortic valve. There is mild thickening of the aortic valve. Aortic valve regurgitation is not visualized. Mild aortic valve sclerosis is present, with no evidence of aortic valve stenosis. 6. The inferior vena cava is normal in size with greater than 50% respiratory variability, suggesting  right atrial pressure of 3 mmHg.  FINDINGS Left Ventricle: Left ventricular ejection fraction, by estimation, is 60 to 65%. The left ventricle has normal function. The left ventricle has no regional wall motion abnormalities. The left ventricular internal cavity size was normal in size. There is mild left ventricular hypertrophy. Left ventricular diastolic parameters are consistent with Grade I diastolic dysfunction (impaired relaxation).  Right Ventricle: The right ventricular size is normal. No increase in right ventricular wall thickness. Right ventricular systolic function is normal. There is normal pulmonary artery systolic pressure. The tricuspid regurgitant velocity is 2.36 m/s, and with an assumed right atrial pressure of 8 mmHg, the estimated right ventricular systolic pressure is 30.3 mmHg.  Left Atrium: Left atrial size was severely dilated.  Right Atrium: Right atrial size was normal in size.  Pericardium: There is no evidence of pericardial effusion.  Mitral Valve: The mitral valve is normal in structure. No evidence of mitral valve regurgitation. No evidence of mitral valve stenosis.  Tricuspid Valve: The tricuspid valve is normal in structure. Tricuspid valve regurgitation is not demonstrated. No evidence of tricuspid stenosis.  Aortic Valve: The aortic valve is normal in structure. There is mild calcification of the aortic valve. There is mild thickening of the aortic valve. Aortic valve regurgitation is not visualized. Mild aortic valve sclerosis is present, with no evidence of aortic valve stenosis.  Pulmonic Valve: The pulmonic valve was normal in structure. Pulmonic valve regurgitation is trivial. No evidence of pulmonic stenosis.  Aorta: The aortic root is normal in size and structure.  Venous: The inferior vena cava is normal in size with greater than 50% respiratory variability, suggesting right atrial pressure of 3 mmHg.  IAS/Shunts: No atrial level shunt detected  by color flow Doppler.   LEFT VENTRICLE PLAX 2D LVIDd:         4.60 cm LVIDs:         2.90 cm LV PW:         1.20 cm LV IVS:        1.10 cm LVOT diam:     2.10 cm LV SV:         70 LV SV Index:   38 LVOT Area:     3.46 cm   RIGHT VENTRICLE  IVC RV Basal diam:  3.80 cm  IVC diam: 2.70 cm RV Mid diam:    2.60 cm TAPSE (M-mode): 1.9 cm  LEFT ATRIUM              Index       RIGHT ATRIUM           Index LA diam:        5.40 cm  2.90 cm/m  RA Area:     19.70 cm LA Vol (A2C):   94.2 ml  50.66 ml/m RA Volume:   56.00 ml  30.12 ml/m LA Vol (A4C):   109.0 ml 58.62 ml/m LA Biplane Vol: 103.0 ml 55.39 ml/m AORTIC VALVE LVOT Vmax:   108.05 cm/s LVOT Vmean:  69.300 cm/s LVOT VTI:    0.202 m  AORTA Ao Root diam: 2.70 cm Ao Asc diam:  3.00 cm  MITRAL VALVE               TRICUSPID VALVE MV Area (PHT): 2.99 cm    TR Peak grad:   22.3 mmHg MV Decel Time: 254 msec    TR Vmax:        236.00 cm/s MV E velocity: 94.65 cm/s MV A velocity: 80.90 cm/s  SHUNTS MV E/A ratio:  1.17        Systemic VTI:  0.20 m Systemic Diam: 2.10 cm  Donato Schultz MD Electronically signed by Donato Schultz MD Signature Date/Time: 11/15/2020/1:15:09 PM    Final   TEE  ECHO TEE 08/16/2022  Narrative TRANSESOPHOGEAL ECHO REPORT    Patient Name:   [name] [facility] Date of Exam: 08/15/2022 Medical Rec #:  [id number]       Height:       61.0 in Accession #:    [id number]      Weight:       187.0 lb Date of Birth:  March 15, 1946      BSA:          1.836 m Patient Age:    76 years        BP:           120/68 mmHg Patient Gender: F               HR:           76 bpm. Exam Location:  Inpatient  Procedure: Transesophageal Echo, 3D Echo, Color Doppler and Cardiac Doppler  Indications:     I48.91* Unspecified atrial fibrillation  History:         Patient has prior history of Echocardiogram examinations, most recent 11/15/2020. CHF; Risk Factors:Hypertension, Diabetes, Dyslipidemia and Sleep  Apnea.  Sonographer:     Irving Burton Senior RDCS Referring Phys:  [id number] [facility] Diagnosing Phys: Wilfred Lacy  PROCEDURE: After discussion of the risks and benefits of a TEE, an informed consent was obtained from the patient. The transesophogeal probe was passed without difficulty through the esophogus of the patient. Sedation performed by different physician. The patient was monitored while under deep sedation. Anesthestetic sedation was provided intravenously by Anesthesiology: 151mg  of Propofol, 60mg  of Lidocaine. The patient developed no complications during the procedure. A successful direct current cardioversion was performed at 200 joules with 1 attempt.  IMPRESSIONS   1. Left ventricular ejection fraction, by estimation, is 60 to 65%. The left ventricle has normal function. The left ventricle has no regional wall motion abnormalities. There is mild concentric left ventricular hypertrophy. 2. Right ventricular systolic function is normal. The right  ventricular size is mildly enlarged. 3. Peak RV-RA gradient 31 mmHg. 4. Measurements done for Watchman. Left atrial size was severely dilated. No left atrial/left atrial appendage thrombus was detected. 5. Right atrial size was mildly dilated. 6. There is a small 1.9 x 1.2 cm relatively high secundum ASD with left to right flow. The pulmonary veins appear to drain normally to the left atrium. 7. The mitral valve is normal in structure. Trivial mitral valve regurgitation. No evidence of mitral stenosis. 8. The aortic valve is tricuspid. Aortic valve regurgitation is not visualized. No aortic stenosis is present.  FINDINGS Left Ventricle: Left ventricular ejection fraction, by estimation, is 60 to 65%. The left ventricle has normal function. The left ventricle has no regional wall motion abnormalities. The left ventricular internal cavity size was normal in size. There is mild concentric left ventricular hypertrophy.  Right Ventricle:  The right ventricular size is mildly enlarged. No increase in right ventricular wall thickness. Right ventricular systolic function is normal.  Left Atrium: Measurements done for Watchman. Left atrial size was severely dilated. No left atrial/left atrial appendage thrombus was detected.  Right Atrium: Right atrial size was mildly dilated.  Pericardium: There is no evidence of pericardial effusion.  Mitral Valve: The mitral valve is normal in structure. Trivial mitral valve regurgitation. No evidence of mitral valve stenosis.  Tricuspid Valve: Peak RV-RA gradient 31 mmHg. The tricuspid valve is normal in structure. Tricuspid valve regurgitation is mild.  Aortic Valve: The aortic valve is tricuspid. Aortic valve regurgitation is not visualized. No aortic stenosis is present.  Pulmonic Valve: The pulmonic valve was normal in structure. Pulmonic valve regurgitation is not visualized.  Aorta: The aortic root is normal in size and structure.  IAS/Shunts: There is a small 1.9 x 1.2 cm relatively high secundum ASD with left to right flow. The pulmonary veins appear to drain normally to the left atrium.  Additional Comments: Spectral Doppler performed.  [employer] Electronically signed by Wilfred Lacy Signature Date/Time: 08/16/2022/3:56:59 PM    Final   MONITORS  LONG TERM MONITOR (3-14 DAYS) 12/02/2019   CT SCANS  CT CORONARY MORPH W/CTA COR W/SCORE 05/15/2018  Addendum 05/15/2018  9:57 PM ADDENDUM REPORT: 05/15/2018 21:54  CLINICAL DATA:  24F with hypertension and diabetes admitted with chest pain.  EXAM: Cardiac/Coronary  CT  TECHNIQUE: The patient was scanned on a [employer].  FINDINGS: A 120 kV prospective scan was triggered in the descending thoracic aorta at 111 HU's. Axial non-contrast 3 mm slices were carried out through the heart. The data set was analyzed on a dedicated work station and scored using the Agatson method. Gantry rotation  speed was 250 msecs and collimation was .6 mm. No beta blockade and 0.8 mg of sl NTG was given. The 3D data set was reconstructed in 5% intervals of the 67-82 % of the R-R cycle. Diastolic phases were analyzed on a dedicated work station using MPR, MIP and VRT modes. The patient received 80 cc of contrast.  Aorta: Normal size. Ascending aorta 2.9 cm. Mild calcification of the aortic root. No dissection.  Aortic Valve:  Trileaflet.  No calcifications.  Coronary Arteries:  Normal coronary origin.  Right dominance.  RCA is a small, non-dominant artery. There is minimal (1-24%) non-obstructing mixed plaque in the proximal RCA.  Left main is a large artery that gives rise to LAD and LCX arteries.  LAD is a very large vessel that wraps the apex and gives rise to the PDA. The LAD  has minimal (1-24%) mixed calcified and soft attenuation plaque proximally and mild (25-49%) soft plaque in the mid LAD, worse at the level of D1. There is moderate (25-49%) mixed plaque in the apical LAD.  LCX is a non-dominant artery that gives rise to two OM branches. There is minimal (1-24%) mixed plaque proximally.  Other findings:  Normal pulmonary vein drainage into the left atrium.  Normal let atrial appendage without a thrombus.  Pulmonary artery is enlarged consistent with elevated pulmonary pressures.  IMPRESSION: 1.  Study quality is limited by body habitus.  2. Coronary calcium score of 71.9. This was 77th percentile for age and sex matched control.  2.  Normal coronary origin with hyperdominant LAD.  3. There is mild plaque in the mid LAD and moderate plaque in the distal LAD.  4.  Will send study for FFRct.  5.  Recommend aggressive risk factor modification.  6. Dilation of the pulmonary artery consistent with elevated pulmonary pressures.  Chilton Si, MD   Electronically Signed By: Chilton Si On: 05/15/2018 21:54  Narrative EXAM: OVER-READ INTERPRETATION   CT CHEST  The following report is an over-read performed by radiologist Dr. Genevive Bi of [facility] on 05/15/2018. This over-read does not include interpretation of cardiac or coronary anatomy or pathology. The coronary CTA interpretation by the cardiologist is attached.  COMPARISON:  Chest radiograph 05/15/2018  FINDINGS: Limited view of the lung parenchyma demonstrates mild basilar atelectasis. Airways are normal.  Limited view of the mediastinum demonstrates no adenopathy. Esophagus normal.  Limited view of the upper abdomen unremarkable.  Limited view of the skeleton and chest wall is unremarkable. Degenerative osteophytosis of the spine.  IMPRESSION: 1. Mild basilar atelectasis. 2. Degenerate spurring of the spine  Electronically Signed: By: Genevive Bi M.D. On: 05/15/2018 19:14           EKG:  EKG is ordered today.  The ekg ordered today demonstrates normal sinus rhythm 63 bpm, incomplete right bundle branch block.  Recent Labs: 05/17/2022: Magnesium 2.1 09/28/2022: B Natriuretic Peptide 132.4 10/22/2022: ALT 16 11/15/2022: BUN 14; Creatinine, Ser 0.92; Hemoglobin 8.1; Platelets 442; Potassium 4.0; Sodium 138  Recent Lipid Panel    Component Value Date/Time   CHOL 145 05/04/2021 1014   CHOL 145 10/22/2018 1446   TRIG 16 05/04/2021 1014   HDL 79 05/04/2021 1014   HDL 70 10/22/2018 1446   CHOLHDL 1.8 05/04/2021 1014   VLDL 3 05/04/2021 1014   LDLCALC 63 05/04/2021 1014   LDLCALC 65 10/22/2018 1446   LDLDIRECT 66.0 01/27/2015 1104                   Physical Exam:    VS:  BP 110/70   Pulse 63   Ht 5\' 1"  (1.549 m)   Wt 179 lb 3.2 oz (81.3 kg)   SpO2 96%   BMI 33.86 kg/m     Wt Readings from Last 3 Encounters:  11/15/22 179 lb 3.2 oz (81.3 kg)  10/22/22 179 lb 11.2 oz (81.5 kg)  10/12/22 179 lb (81.2 kg)     GEN:  Well nourished, well developed in no acute distress HEENT: Normal NECK: No JVD; No carotid  bruits LYMPHATICS: No lymphadenopathy CARDIAC: RRR, 2/6 systolic murmur best heard at the left upper sternal border RESPIRATORY:  Clear to auscultation without rales, wheezing or rhonchi  ABDOMEN: Soft, non-tender, non-distended MUSCULOSKELETAL:  No edema; No deformity  SKIN: Warm and dry NEUROLOGIC:  Alert and oriented x  3 PSYCHIATRIC:  Normal affect   ASSESSMENT:    1. ASD (atrial septal defect)    PLAN:    In order of problems listed above:  The patient has a hemodynamically significant secundum ASD.  I reviewed her TEE images again today and it demonstrates suitable anatomy for transcatheter closure.  The patient has mild pulmonary hypertension, but hemodynamics are not prohibitive for ASD closure.  I reviewed the risks, indications, and alternatives to transcatheter ASD closure with her today. She understands the risks of transcatheter closure include but are not limited to bleeding, infection, device embolization, device erosion, stroke, myocardial infarction, arrhythmia, cardiac injury with hemopericardium, need for emergent pericardiocentesis, need for emergent surgery, and death, all occurring at an incidence of 1% or less except for arrhythmia which can occur at an incidence of 2-3%.  I would ask her to hold her apixaban the night before and morning of the procedure.  I would anticipate starting him back at normal dosing schedule the day following the procedure.  I reviewed her atrial septal anatomy and she has a long septum.  I think if she requires left atrial appendage occlusion in the future, there is enough septal length to puncture outside of an ASD closure device.  She continues to undergo evaluation and treatment by hematology and has a pending GI referral.  GI evaluation in the past has not demonstrated any significant pathology or source of bleeding.  The patient will be scheduled for transcatheter ASD closure at the next available time.  We will repeat a CBC and metabolic panel  today to make sure that her labs are stable.  As long as her hemoglobin is greater than 8 mg/dL, I think we can proceed.      Medication Adjustments/Labs and Tests Ordered: Current medicines are reviewed at length with the patient today.  Concerns regarding medicines are outlined above.  Orders Placed This Encounter  Procedures   CBC with Differential/Platelet   Basic metabolic panel   EKG 12-Lead   No orders of the defined types were placed in this encounter.   Patient Instructions  Medication Instructions:  Your physician recommends that you continue on your current medications as directed. Please refer to the Current Medication list given to you today.  *If you need a refill on your cardiac medications before your next appointment, please call your pharmacy*  Lab Work: Your physician recommends that you have lab work today- BMET and CBC  If you have labs (blood work) drawn today and your tests are completely normal, you will receive your results only by: MyChart Message (if you have MyChart) OR A paper copy in the mail If you have any lab test that is abnormal or we need to change your treatment, we will call you to review the results.  Follow-Up: At [facility], you and your health needs are our priority.  As part of our continuing mission to provide you with exceptional heart care, we have created designated Provider Care Teams.  These Care Teams include your primary Cardiologist (physician) and Advanced Practice Providers (APPs -  Physician Assistants and Nurse Practitioners) who all work together to provide you with the care you need, when you need it.  We recommend signing up for the patient portal called "MyChart".  Sign up information is provided on this After Visit Summary.  MyChart is used to connect with patients for Virtual Visits (Telemedicine).  Patients are able to view lab/test results, encounter notes, upcoming appointments, etc.  Non-urgent  messages can  be sent to your provider as well.   To learn more about what you can do with MyChart, go to [email or web address].    Your next appointment:   The Structural Team will call you to schedule your procedure and your follow-up appointment.  Provider:   Tonny Bollman, MD      Signed, Tonny Bollman, MD  11/16/2022 5:02 PM    [facility]

## 2022-11-15 NOTE — Patient Instructions (Signed)
Medication Instructions:  Your physician recommends that you continue on your current medications as directed. Please refer to the Current Medication list given to you today.  *If you need a refill on your cardiac medications before your next appointment, please call your pharmacy*  Lab Work: Your physician recommends that you have lab work today- BMET and CBC  If you have labs (blood work) drawn today and your tests are completely normal, you will receive your results only by: MyChart Message (if you have MyChart) OR A paper copy in the mail If you have any lab test that is abnormal or we need to change your treatment, we will call you to review the results.  Follow-Up: At [facility], you and your health needs are our priority.  As part of our continuing mission to provide you with exceptional heart care, we have created designated Provider Care Teams.  These Care Teams include your primary Cardiologist (physician) and Advanced Practice Providers (APPs -  Physician Assistants and Nurse Practitioners) who all work together to provide you with the care you need, when you need it.  We recommend signing up for the patient portal called "MyChart".  Sign up information is provided on this After Visit Summary.  MyChart is used to connect with patients for Virtual Visits (Telemedicine).  Patients are able to view lab/test results, encounter notes, upcoming appointments, etc.  Non-urgent messages can be sent to your provider as well.   To learn more about what you can do with MyChart, go to [email or web address].    Your next appointment:   The Structural Team will call you to schedule your procedure and your follow-up appointment.  Provider:   Tonny Bollman, MD

## 2022-11-16 ENCOUNTER — Inpatient Hospital Stay: Payer: Medicare HMO

## 2022-11-16 ENCOUNTER — Telehealth: Payer: Self-pay

## 2022-11-16 LAB — CBC WITH DIFFERENTIAL/PLATELET
Basophils Absolute: 0.1 10*3/uL (ref 0.0–0.2)
Basos: 1 %
EOS (ABSOLUTE): 0.2 10*3/uL (ref 0.0–0.4)
Eos: 3 %
Hematocrit: 27.9 % — ABNORMAL LOW (ref 34.0–46.6)
Hemoglobin: 8.1 g/dL — ABNORMAL LOW (ref 11.1–15.9)
Immature Grans (Abs): 0 10*3/uL (ref 0.0–0.1)
Immature Granulocytes: 0 %
Lymphocytes Absolute: 1.4 10*3/uL (ref 0.7–3.1)
Lymphs: 27 %
MCH: 20.9 pg — ABNORMAL LOW (ref 26.6–33.0)
MCHC: 29 g/dL — ABNORMAL LOW (ref 31.5–35.7)
MCV: 72 fL — ABNORMAL LOW (ref 79–97)
Monocytes Absolute: 0.4 10*3/uL (ref 0.1–0.9)
Monocytes: 8 %
Neutrophils Absolute: 3 10*3/uL (ref 1.4–7.0)
Neutrophils: 61 %
Platelets: 442 10*3/uL (ref 150–450)
RBC: 3.87 x10E6/uL (ref 3.77–5.28)
RDW: 20 % — ABNORMAL HIGH (ref 11.7–15.4)
WBC: 5 10*3/uL (ref 3.4–10.8)

## 2022-11-16 LAB — BASIC METABOLIC PANEL
BUN/Creatinine Ratio: 15 (ref 12–28)
BUN: 14 mg/dL (ref 8–27)
CO2: 26 mmol/L (ref 20–29)
Calcium: 9.1 mg/dL (ref 8.7–10.3)
Chloride: 99 mmol/L (ref 96–106)
Creatinine, Ser: 0.92 mg/dL (ref 0.57–1.00)
Glucose: 192 mg/dL — ABNORMAL HIGH (ref 70–99)
Potassium: 4 mmol/L (ref 3.5–5.2)
Sodium: 138 mmol/L (ref 134–144)
eGFR: 65 mL/min/{1.73_m2} (ref >59)

## 2022-11-16 NOTE — Progress Notes (Signed)
Care Management & Coordination Services Pharmacy Team  Reason for Encounter: Appointment Reminder  Contacted patient to confirm telephone appointment with Cherylin Mylar, PharmD on 11-21-2022 at 12:00. Spoke with patient on 11/16/2022   Do you have any problems getting your medications? No  What is your top health concern you would like to discuss at your upcoming visit? Patient stated no concerns  Have you seen any other providers since your last visit with PCP? Yes   Chart review: Recent office visits:  10-01-2022 [name], MD. Visit for DM and HTN. Start loratadine  10 mg daily. Referral placed to oncology and gastro.  07-18-2022 Barb Merino, LPN. Medicare wellness visit.  07-12-2022 [name], MD. Hospital follow up. No changes  Recent consult visits:  11-15-2022 Tonny Bollman, MD (Cardiology). Follow up visit for ASD.Stop abilify. EKG completed.  11-09-2022 Rolanda Jay, RN (Oncology). Visit for Sodium Ferric Gluconate Complex Injection  11-08-2022 Dominica Severin, MD (Ortho). Follow up for pain in right hand.  11-02-2022 Julieanne Manson, RN (Oncology). Visit for Sodium Ferric Gluconate Complex Injection  11-01-2022 Dominica Severin, MD (Ortho). Follow up for pain in right hand and neck.  10-22-2022 Heilingoetter, Cassandra L, PA-C (Oncology). Initial visit for Iron deficiency anemia. Start Integra plus caps daily  10-12-2022 Laurey Morale, MD (Oncology). Visit for blood transfusion.  10-08-2022 Tonny Bollman, MD (Cardiology). Follow up visit for ASD.  10-05-2022 Chilton Greathouse, MD (Pulmonary). Follow up visit for dyspnea on exertion. DG chest view completed.  09-28-2022 Jacklynn Ganong, FNP (Cardiology). Follow up visit for CHF. No changes follow up in 3 months.  09-25-2022 Sallye Lat (Ophthalmology). DM exam.  09-07-2022 Laurey Morale, MD (Cardiology). Visit for right heart cath procedure.  08-24-2022 Jacklynn Ganong,  FNP (Cardiology). Follow up visit for CHF.  08-15-2022 Laurey Morale, MD (Cardiology). Visit for cardioversion.  07-13-2022 Laurey Morale, MD (Cardiology). Visit for CHF. EKG completed. Echocardiogram ordered.  Hospital visits:  Medication Reconciliation was completed by comparing discharge summary, patient's EMR and Pharmacy list, and upon discussion with patient.  Admitted to the hospital on 07-25-2022 due to Acute GI bleed. Discharge date was 07-27-2022. Discharged from [facility].    New?Medications Started at [facility] Discharge:?? Eliquis 5 mg twice daily  Medication Changes at Hospital Discharge: None  Medications Discontinued at Hospital Discharge: None  Medications that remain the same after Hospital Discharge:??  -All other medications will remain the same.    Hospital visits:  Medication Reconciliation was completed by comparing discharge summary, patient's EMR and Pharmacy list, and upon discussion with patient.  Admitted to the hospital on 07-05-2022 due to stroke-like symptoms. Discharge date was 07-06-2022. Discharged from [facility].    New?Medications Started at [facility] Discharge:?? Eliquis 5 mg twice daily  Medication Changes at Hospital Discharge: None  Medications Discontinued at Hospital Discharge: None  Medications that remain the same after Hospital Discharge:??  -All other medications will remain the same.    Star Rating Drugs:  Atorvastatin 10 mg- Last filled 10-01-2022 90 DS [employer]. Previous 07-25-2022 90 DS. Ozempic 1 mg- Last filled 09-19-2022 84 DS [employer]. Previous 07-06-2022 84 DS.  Care Gaps: Annual wellness visit in last year? Yes  If Diabetic: Last eye exam / retinopathy screening:09-25-2022 Last diabetic foot exam:None   Huey Romans [facility] Clinical Pharmacist Assistant [phone]

## 2022-11-21 ENCOUNTER — Telehealth: Payer: Self-pay

## 2022-11-21 NOTE — Progress Notes (Unsigned)
Care Management & Coordination Services Pharmacy Note  11/21/2022 Name:  [name] MRN:  [id number] DOB:  [birth year]  Summary: ***  Recommendations/Changes made from today's visit: ***  Follow up plan: ***   Subjective: [name] is an 77 y.o. year old female who is a primary patient of [name], MD.  The care coordination team was consulted for assistance with disease management and care coordination needs.    {CCMTELEPHONEFACETOFACE:21091510} for {CCMINITIALFOLLOWUPCHOICE:21091511}.  Recent office visits: ***  Recent consult visits: ***  Hospital visits: {Hospital DC Yes/No:25215}   Objective:  Lab Results  Component Value Date   CREATININE 0.92 11/15/2022   BUN 14 11/15/2022   GFR 86.68 05/15/2016   EGFR 65 11/15/2022   GFRNONAA >60 10/22/2022   GFRAA 75 06/06/2020   NA 138 11/15/2022   K 4.0 11/15/2022   CALCIUM 9.1 11/15/2022   CO2 26 11/15/2022   GLUCOSE 192 (H) 11/15/2022    Lab Results  Component Value Date/Time   HGBA1C 7.1 (H) 10/01/2022 12:15 PM   HGBA1C 7.4 (H) 06/14/2022 04:58 PM   FRUCTOSAMINE 290 (H) 05/15/2016 01:35 PM   FRUCTOSAMINE 282 07/01/2015 10:41 AM   GFR 86.68 05/15/2016 01:35 PM   GFR 82.17 04/17/2016 09:55 AM   MICROALBUR 10 06/06/2020 01:23 PM   MICROALBUR 10 01/06/2020 12:16 PM    Last diabetic Eye exam:  Lab Results  Component Value Date/Time   HMDIABEYEEXA No Retinopathy 09/25/2022 10:47 AM    Last diabetic Foot exam: No results found for: "HMDIABFOOTEX"   Lab Results  Component Value Date   CHOL 145 05/04/2021   HDL 79 05/04/2021   LDLCALC 63 05/04/2021   LDLDIRECT 66.0 01/27/2015   TRIG 16 05/04/2021   CHOLHDL 1.8 05/04/2021       Latest Ref Rng & Units 10/22/2022    1:23 PM 07/26/2022    6:59 AM 07/25/2022    2:43 PM  Hepatic Function  Total Protein 6.5 - 8.1 g/dL 7.8  6.3  6.9   Albumin 3.5 - 5.0 g/dL 4.1  2.9  3.3   AST 15 - 41 U/L 19  14  17    ALT 0 - 44 U/L 16  10  9    Alk  Phosphatase 38 - 126 U/L 112  74  83   Total Bilirubin 0.3 - 1.2 mg/dL 0.3  0.6  0.2     Lab Results  Component Value Date/Time   TSH 2.010 03/27/2021 03:36 PM   TSH 1.500 11/09/2019 12:21 AM   TSH 1.332 02/25/2019 03:32 AM   TSH 0.768 08/13/2010 08:36 AM       Latest Ref Rng & Units 11/15/2022   11:33 AM 10/22/2022    1:23 PM 10/01/2022   12:15 PM  CBC  WBC 3.4 - 10.8 x10E3/uL 5.0  4.5  4.2   Hemoglobin 11.1 - 15.9 g/dL 8.1  8.3  7.0   Hematocrit 34.0 - 46.6 % 27.9  27.5  24.1   Platelets 150 - 450 x10E3/uL 442  374  348     Lab Results  Component Value Date/Time   VITAMINB12 320 10/22/2022 01:23 PM   VITAMINB12 519 06/14/2022 04:58 PM    Clinical ASCVD: {YES/NO:21197} The 10-year ASCVD risk score (Arnett DK, et al., 2019) is: 22%   Values used to calculate the score:     Age: 52 years     Sex: Female     Is Non-Hispanic African American: Yes     Diabetic: Yes  Tobacco smoker: No     Systolic Blood Pressure: 110 mmHg     Is BP treated: Yes     HDL Cholesterol: 79 mg/dL     Total Cholesterol: 145 mg/dL    ***Other: ([id number] if Afib, MMRC or CAT for COPD, ACT, DEXA)     10/01/2022   11:12 AM 07/30/2022   11:59 AM 07/18/2022   11:10 AM  Depression screen PHQ 2/9  Decreased Interest 0 0 0  Down, Depressed, Hopeless 0 0 0  PHQ - 2 Score 0 0 0  Altered sleeping 3 0   Tired, decreased energy 3 0   Change in appetite 0 0   Feeling bad or failure about yourself  0 0   Trouble concentrating 1 0   Moving slowly or fidgety/restless 0 0   Suicidal thoughts 0 0   PHQ-9 Score 7 0   Difficult doing work/chores Somewhat difficult Not difficult at all      Social History   Tobacco Use  Smoking Status Former   Packs/day: 0.50   Years: 25.00   Additional pack years: 0.00   Total pack years: 12.50   Types: Cigarettes  Smokeless Tobacco Never  Tobacco Comments   quit smoking 20+ytrs ago   BP Readings from Last 3 Encounters:  11/15/22 110/70  11/09/22 (!) 149/77   11/02/22 134/60   Pulse Readings from Last 3 Encounters:  11/15/22 63  11/09/22 (!) 59  11/02/22 61   Wt Readings from Last 3 Encounters:  11/15/22 179 lb 3.2 oz (81.3 kg)  10/22/22 179 lb 11.2 oz (81.5 kg)  10/12/22 179 lb (81.2 kg)   BMI Readings from Last 3 Encounters:  11/15/22 33.86 kg/m  10/22/22 33.95 kg/m  10/12/22 33.82 kg/m    Allergies  Allergen Reactions   Other Anaphylaxis and Rash    Bleach   Penicillins Hives    Did it involve swelling of the face/tongue/throat, SOB, or low BP? No Did it involve sudden or severe rash/hives, skin peeling, or any reaction on the inside of your mouth or nose? Yes Did you need to seek medical attention at a hospital or doctor's office? Yes When did it last happen?      Childhood allergy  If all above answers are "NO", may proceed with cephalosporin use.     Sulfa Antibiotics Hives   Aspirin Other (See Comments)     On aspirin 81 mg - Rectal bleeding in dec 2018   Codeine     Headache and makes the patient feel "off"    Medications Reviewed Today     Reviewed by Nedra Hai, CMA (Certified Medical Assistant) on 11/15/22 at 1058  Med List Status: <None>   Medication Order Taking? Sig Documenting Provider Last Dose Status Informant  acetaminophen (TYLENOL) 500 MG tablet [id number] Yes Take 500-1,000 mg by mouth every 4 (four) hours as needed for moderate pain. [provider] Taking Active Self  albuterol (VENTOLIN HFA) 108 (90 Base) MCG/ACT inhaler [id number] Yes Inhale 1-2 puffs into the lungs every 6 (six) hours as needed for wheezing or shortness of breath. Chilton Greathouse, MD Taking Active Self  Alcohol Swabs (DROPSAFE ALCOHOL PREP) 70 % PADS [id number] Yes USE TWO TIMES DAILY AS DIRECTED [name], MD Taking Active   apixaban (ELIQUIS) 5 MG TABS tablet [id number] Yes Take 1 tablet (5 mg total) by mouth 2 (two) times daily. Laurey Morale, MD Taking Active   Discontinued 11/15/22 1057 (Discontinued by  provider)  Ascorbic Acid (VITAMIN C) 1000 MG tablet [id number] Yes Take 1,000 mg by mouth daily. [provider] Taking Active Self  atorvastatin (LIPITOR) 10 MG tablet [id number] Yes TAKE 1 TABLET EVERY Josephine Igo, MD Taking Active Self  B-D ULTRAFINE III SHORT PEN 31G X 8 MM MISC [id number] Yes USE DAILY WITH VICTOZA AND/OR NOVOLOG. [name], MD Taking Active Self  benzonatate (TESSALON) 100 MG capsule [id number] Yes TAKE 1 CAPSULE THREE TIMES DAILY AS NEEDED FOR COUGH [name], MD Taking Active   Blood Glucose Monitoring Suppl (TRUE METRIX METER) w/Device KIT [id number] Yes USE AS DIRECTED [name], MD Taking Active   carvedilol (COREG) 12.5 MG tablet [id number] Yes TAKE 1 TABLET TWICE DAILY Laurey Morale, MD Taking Active Self  dexlansoprazole (DEXILANT) 60 MG capsule [id number] Yes Take 60 mg by mouth daily. [provider] Taking Active Self  diclofenac sodium (VOLTAREN) 1 % GEL [id number] Yes Apply 2 g topically 4 (four) times daily as needed (pain). Joseph Art, DO Taking Active Self  FeFum-FePoly-FA-B Cmp-C-Biot (INTEGRA PLUS) CAPS [id number] Yes Take 1 capsule by mouth every morning. Heilingoetter, Cassandra L, PA-C Taking Active   ferrous sulfate 324 MG TBEC [id number] Yes Take 324 mg by mouth daily with breakfast. [provider] Taking Active Self  furosemide (LASIX) 20 MG tablet [id number] Yes TAKE 4 TABLETS (80 MG) BY MOUTH IN THE MORNING AND 3 TABLETS (60 MG) EVERY EVENING.  Patient taking differently: Take 60-80 mg by mouth See admin instructions. Take 80 mg in the morning and 60 mg in the evening   [place], Anderson Malta, FNP Taking Active Self  Glucagon (GVOKE HYPOPEN 2-PACK) 0.5 MG/0.1ML Ivory Broad [id number] Yes INJECT 0.5 MG (1 PEN) INTO THE SKIN DAILY AS NEEDED. [name], MD Taking Active   guaiFENesin-dextromethorphan [facility] DM) 100-10 MG/5ML syrup [id number] Yes Take 10 mLs by mouth every 6 (six) hours as needed for cough.  [place], Eric J, DO Taking Active Self  hydrALAZINE (APRESOLINE) 100 MG tablet [id number] Yes Take 1 tablet (100 mg total) by mouth 3 (three) times daily. Laurey Morale, MD Taking Active Self  hydrocortisone cream 1 % [id number] Yes Apply 1 application topically daily as needed for itching.  [provider] Taking Active Self  insulin glargine, 1 Unit Dial, (TOUJEO) 300 UNIT/ML Solostar Pen [id number] Yes Inject 20 Units into the skin at bedtime. [provider] Taking Active Self  insulin lispro (HUMALOG KWIKPEN) 100 UNIT/ML KwikPen [id number] Yes Inject 0.02-0.04 mLs (2-4 Units total) into the skin 3 (three) times daily as needed (high blood sugar over 150).  Patient taking differently: Inject 0-10 Units into the skin See admin instructions. Sliding scale Inject  (high blood sugar over 150).  150-199=2 units 200-249=4 units 250-299=6 units 300-349=8 units 350 above 10 units Over 400 call physician   Charlesetta Ivory, NP Taking Active Self  Isopropyl Alcohol (ALCOHOL WIPES) 70 % MISC [id number] Yes Apply 1 each topically 2 (two) times daily. Arnette Felts, FNP Taking Active Self  isosorbide mononitrate (IMDUR) 30 MG 24 hr tablet [id number] Yes Take 1 tablet (30 mg total) by mouth daily. Tonny Bollman, MD Taking Active   lamoTRIgine (LAMICTAL) 150 MG tablet [id number] Yes Take 1 tablet (150 mg total) by mouth 2 (two) times daily. Van Clines, MD Taking Active Self  levETIRAcetam (KEPPRA) 500 MG tablet [id number] Yes Take 2 tablets every night Van Clines, MD Taking Active Self  loratadine (CLARITIN) 10 MG tablet [id number] Yes Take 1 tablet (10 mg  total) by mouth daily. [name], MD Taking Active   ondansetron [facility]) 4 MG tablet [id number] Yes Take 1 tablet (4 mg total) by mouth every 8 (eight) hours as needed for nausea or vomiting. [name], MD Taking Active Self  OZEMPIC, 1 MG/DOSE, 4 MG/3ML Namon Cirri [id number] Yes INJECT 1MG  UNDER THE SKIN ONE TIME WEEKLY  [name], MD Taking Active   Pancrelipase, Lip-Prot-Amyl, 25000-79000 units CPEP [id number] Yes Take 1-2 capsules by mouth See admin instructions. Take 2 capsules in the morning with breakfast, 1 capsule with lunch, and 2 capsules with supper [provider] Taking Active Self           Med Note [name], Smitty Cords   Wed Jan 11, 2021 11:55 AM)    potassium chloride SA (KLOR-CON M) 20 MEQ tablet [id number] Yes Take 2 tablets (40 mEq total) by mouth daily. With additional on metolazone days  Patient taking differently: Take 40 mEq by mouth daily. With additional on metolazone days. Per patient taking 2 tablets in the am and 1 tablet at night   Laurey Morale, MD Taking Active   sennosides-docusate sodium (SENOKOT-S) 8.6-50 MG tablet [id number] Yes Take 1-2 tablets by mouth at bedtime as needed for constipation. [provider] Taking Active Self  sertraline (ZOLOFT) 50 MG tablet [id number] Yes TAKE 1 TABLET EVERY DAY [name], MD Taking Active   tiZANidine (ZANAFLEX) 2 MG tablet [id number] Yes TAKE 1 TABLET AT BEDTIME AS NEEDED [name], MD Taking Active Self  tolnaftate (TINACTIN) 1 % cream [id number] Yes Apply 1 application topically daily as needed (foot fungus).  [provider] Taking Active Self  TRUE METRIX BLOOD GLUCOSE TEST test strip [id number] Yes USE AS DIRECTED TO CHECK BLOOD SUGARS 2 TIMES PER DAY [name], MD Taking Active Self  TRUEplus Lancets 33G MISC [id number] Yes TEST BLOOD SUGAR TWICE DAILY AS DIRECTED [name], MD Taking Active Self            SDOH:  (Social Determinants of Health) assessments and interventions performed: {yes/no:20286} SDOH Interventions    Flowsheet Row Clinical Support from 07/18/2022 in [facility] Most recent reading at 07/18/2022 11:07 AM Office Visit from 04/10/2021 in [facility] Most recent reading at 05/21/2021  10:53 PM Chronic Care Management from 04/26/2021 in [facility] Most recent reading at 04/26/2021  2:49 PM Office Visit from 01/09/2021 in [facility] Most recent reading at 01/09/2021  3:46 PM Chronic Care Management from 02/01/2020 in [facility] Most recent reading at 02/01/2020  5:44 PM Chronic Care Management from 12/21/2019 in [facility] Most recent reading at 12/21/2019  3:21 PM  SDOH Interventions        Food Insecurity Interventions Intervention Not Indicated -- -- -- -- --  Transportation Interventions Intervention Not Indicated -- [employer] -- -- --  Depression Interventions/Treatment  -- Currently on Treatment -- Medication -- --  Financial Strain Interventions Intervention Not Indicated -- -- -- Intervention Not Indicated Intervention Not Indicated  Physical Activity Interventions Patient Refused, Other (Comments) -- -- -- -- --  Stress Interventions Intervention Not Indicated -- -- -- -- --       Medication Assistance: {MEDASSISTANCEINFO:25044}  Medication Access: Name and location of current pharmacy:  [facility] - [place], [place] - [address] [place] [address]  Phone: [phone] Fax: [phone]  [facility] - [place], [place] - [address] [place] [address] Phone: [phone] Fax: [phone]  Within the past 30 days, how often has patient missed a dose of medication? *** Is a pillbox or other method used to improve adherence? {YES/NO:21197} Factors that may affect medication adherence? {CHL DESC; BARRIERS:21522} Are meds synced by current pharmacy? {YES/NO:21197} Are meds delivered by current pharmacy? {YES/NO:21197} Does patient experience delays in picking up medications due to transportation concerns?  {YES/NO:21197}  Compliance/Adherence/Medication fill history: Care Gaps: ***  Star-Rating Drugs: ***   Assessment/Plan   {CCM PHARMD DISEASE STATES:25130}  ***

## 2022-11-21 NOTE — Telephone Encounter (Signed)
  Care Management   Follow Up Note   11/21/2022 Name: [name] MRN: [id number] DOB: Sep 03, 1945   Referred by: [name], MD Reason for referral : No chief complaint on file.   An unsuccessful telephone outreach was attempted today. The patient was referred to the case management team for assistance with care management and care coordination.  Per patient she is waiting for a very important phone call and would prefer to change our visit date to next week on a Tuesday or Wednesday.   Follow Up Plan: Telephone follow up appointment with care coordination pharmacist scheduled for:    Cherylin Mylar, CPP, PharmD Clinical Pharmacist Practitioner [facility] [phone]

## 2022-11-23 ENCOUNTER — Telehealth: Payer: Self-pay | Admitting: Internal Medicine

## 2022-11-23 NOTE — Telephone Encounter (Signed)
Rescheduled missed appointment, patient has been called and nitrified of new appointment.

## 2022-11-26 ENCOUNTER — Inpatient Hospital Stay: Payer: Medicare HMO | Attending: Physician Assistant

## 2022-11-26 ENCOUNTER — Other Ambulatory Visit: Payer: Self-pay

## 2022-11-26 ENCOUNTER — Other Ambulatory Visit: Payer: Self-pay | Admitting: Physician Assistant

## 2022-11-26 ENCOUNTER — Inpatient Hospital Stay: Payer: Medicare HMO

## 2022-11-26 VITALS — BP 111/53 | HR 67 | Temp 97.9°F | Resp 17 | Ht 61.0 in | Wt 180.8 lb

## 2022-11-26 DIAGNOSIS — D509 Iron deficiency anemia, unspecified: Secondary | ICD-10-CM | POA: Insufficient documentation

## 2022-11-26 DIAGNOSIS — D500 Iron deficiency anemia secondary to blood loss (chronic): Secondary | ICD-10-CM

## 2022-11-26 DIAGNOSIS — Z79899 Other long term (current) drug therapy: Secondary | ICD-10-CM | POA: Diagnosis not present

## 2022-11-26 MED ORDER — ACETAMINOPHEN 325 MG PO TABS
650.0000 mg | ORAL_TABLET | Freq: Once | ORAL | Status: AC
  Administered 2022-11-26: 650 mg via ORAL
  Filled 2022-11-26: qty 2

## 2022-11-26 MED ORDER — SODIUM CHLORIDE 0.9 % IV SOLN: Freq: Once | INTRAVENOUS | Status: AC

## 2022-11-26 MED ORDER — SODIUM CHLORIDE 0.9 % IV SOLN
250.0000 mg | Freq: Once | INTRAVENOUS | Status: AC
  Administered 2022-11-26: 250 mg via INTRAVENOUS
  Filled 2022-11-26: qty 20

## 2022-11-26 MED ORDER — CETIRIZINE HCL 10 MG PO TABS
10.0000 mg | ORAL_TABLET | Freq: Once | ORAL | Status: AC
  Administered 2022-11-26: 10 mg via ORAL
  Filled 2022-11-26: qty 1

## 2022-11-26 NOTE — Patient Instructions (Signed)
Sodium Ferric Gluconate Complex Injection What is this medication? SODIUM FERRIC GLUCONATE COMPLEX (SOE dee um FER ik GLOO koe nate KOM pleks) treats low levels of iron (iron deficiency anemia) in people with kidney disease. Iron is a mineral that plays an important role in making red blood cells, which carry oxygen from your lungs to the rest of your body. This medicine may be used for other purposes; ask your health care provider or pharmacist if you have questions. COMMON BRAND NAME(S): Ferrlecit, Nulecit What should I tell my care team before I take this medication? They need to know if you have any of the following conditions: Anemia that is not from iron deficiency High levels of iron in the blood An unusual or allergic reaction to iron, other medications, foods, dyes, or preservatives Pregnant or are trying to become pregnant Breast-feeding How should I use this medication? This medication is injected into a vein. It is given by your care team in a hospital or clinic setting. Talk to your care team about the use of this medication in children. While it may be prescribed for children as young as 6 years for selected conditions, precautions do apply. Overdosage: If you think you have taken too much of this medicine contact a poison control center or emergency room at once. NOTE: This medicine is only for you. Do not share this medicine with others. What if I miss a dose? It is important not to miss your dose. Call your care team if you are unable to keep an appointment. What may interact with this medication? Do not take this medication with any of the following: Deferasirox Deferoxamine Dimercaprol This medication may also interact with the following: Other iron products This list may not describe all possible interactions. Give your health care provider a list of all the medicines, herbs, non-prescription drugs, or dietary supplements you use. Also tell them if you smoke, drink  alcohol, or use illegal drugs. Some items may interact with your medicine. What should I watch for while using this medication? Your condition will be monitored carefully while you are receiving this medication. Visit your care team for regular checks on your progress. You may need blood work while you are taking this medication. What side effects may I notice from receiving this medication? Side effects that you should report to your care team as soon as possible: Allergic reactions--skin rash, itching, hives, swelling of the face, lips, tongue, or throat Low blood pressure--dizziness, feeling faint or lightheaded, blurry vision Shortness of breath Side effects that usually do not require medical attention (report to your care team if they continue or are bothersome): Flushing Headache Joint pain Muscle pain Nausea Pain, redness, or irritation at injection site This list may not describe all possible side effects. Call your doctor for medical advice about side effects. You may report side effects to FDA at 1-800-FDA-1088. Where should I keep my medication? This medication is given in a hospital or clinic and will not be stored at home. NOTE: This sheet is a summary. It may not cover all possible information. If you have questions about this medicine, talk to your doctor, pharmacist, or health care provider.  2023 [employer] (2020-11-04 00:00:00)  

## 2022-11-27 ENCOUNTER — Ambulatory Visit: Payer: Medicare HMO

## 2022-11-27 ENCOUNTER — Telehealth (HOSPITAL_COMMUNITY): Payer: Self-pay | Admitting: Adult Health

## 2022-11-27 NOTE — Progress Notes (Unsigned)
Care Management & Coordination Services Pharmacy Note  11/27/2022 Name:  [name] MRN:  [id number] DOB:  07-17-1945  Summary: Per Cardiology note, [name] to take additional furosemide 20 mg pill for the next three days starting today. Patient informed of the change and took the additional pill while she was on the phone with me. She reports that her hand is extremely swollen and she is unable to move it at all. She also reports that this has been going on for about a week now. Her sister thought it was a bug bite, but the swelling would not go down. I recommended the patient come in to be seen. She reports that she called and left a voicemail with no answer. Upon review by the PCP team, she was called but did not answer the phone. I discussed this with [name] and she agreed that this might be the case because she does not always answer the phone when someone calls her. Per MD patient to go to Urgent care this evening or be seen in the morning at [facility]. Patient reports that she does not have transportation for a visit today, so she will come tomorrow.   Recommendations/Changes made from today's visit: Recommend [name] call her PCP and specialist team with any questions she has or concerns. Recommend she answers the phone when she receives a call from the PCP  and specialty teams.  Follow up plan: Patient to be seen at 10 AM for office visit with FNP - Janece Patient to take Furosemide 20 mg tablet for the next two days  Continue to weigh yourself at least once per day and write down the readings, if greater than 3 lbs please notify the cardiology team Collaborate with PCP to increase [name] Atorvastatin to 40 mg tablet once per day due to atherosclerosis of the aorta.    Subjective: [name] is an 77 y.o. year old female who is a primary patient of [name], MD.  The care coordination team was consulted for assistance with disease management and care  coordination needs.    Engaged with patient by telephone for follow up visit.  Chart review: Recent office visits:  10-01-2022 [name], MD. Visit for DM and HTN. Start loratadine  10 mg daily. Referral placed to oncology and gastro.   07-18-2022 Barb Merino, LPN. Medicare wellness visit.   07-12-2022 [name], MD. Hospital follow up. No changes   Recent consult visits:  11-15-2022 Tonny Bollman, MD (Cardiology). Follow up visit for ASD.Stop abilify. EKG completed.   11-09-2022 Rolanda Jay, RN (Oncology). Visit for Sodium Ferric Gluconate Complex Injection   11-08-2022 Dominica Severin, MD (Ortho). Follow up for pain in right hand.   11-02-2022 Julieanne Manson, RN (Oncology). Visit for Sodium Ferric Gluconate Complex Injection   11-01-2022 Dominica Severin, MD (Ortho). Follow up for pain in right hand and neck.   10-22-2022 Heilingoetter, Cassandra L, PA-C (Oncology). Initial visit for Iron deficiency anemia. Start Integra plus caps daily  10-12-2022 Laurey Morale, MD (Oncology). Visit for blood transfusion.   10-08-2022 Tonny Bollman, MD (Cardiology). Follow up visit for ASD.   10-05-2022 Chilton Greathouse, MD (Pulmonary). Follow up visit for dyspnea on exertion. DG chest view completed.   09-28-2022 Jacklynn Ganong, FNP (Cardiology). Follow up visit for CHF. No changes follow up in 3 months.   09-25-2022 Sallye Lat (Ophthalmology). DM exam.   09-07-2022 Laurey Morale, MD (Cardiology). Visit for right heart cath  procedure.   08-24-2022 Jacklynn Ganong, FNP (Cardiology). Follow up visit for CHF.   08-15-2022 Laurey Morale, MD (Cardiology). Visit for cardioversion.   07-13-2022 Laurey Morale, MD (Cardiology). Visit for CHF. EKG completed. Echocardiogram ordered.   Hospital visits:  Medication Reconciliation was completed by comparing discharge summary, patient's EMR and Pharmacy list, and upon discussion with patient.    Admitted to the hospital on 07-25-2022 due to Acute GI bleed. Discharge date was 07-27-2022. Discharged from [facility].     New?Medications Started at [facility] Discharge:?? Eliquis 5 mg twice daily   Medication Changes at Hospital Discharge: None   Medications Discontinued at Hospital Discharge: None   Medications that remain the same after Hospital Discharge:??  -All other medications will remain the same.     Hospital visits:  Medication Reconciliation was completed by comparing discharge summary, patient's EMR and Pharmacy list, and upon discussion with patient.   Admitted to the hospital on 07-05-2022 due to stroke-like symptoms. Discharge date was 07-06-2022. Discharged from [facility].     New?Medications Started at [facility] Discharge:?? Eliquis 5 mg twice daily   Medication Changes at Hospital Discharge: None   Medications Discontinued at Hospital Discharge: None   Medications that remain the same after Hospital Discharge:??  -All other medications will remain the same.     Objective:  Lab Results  Component Value Date   CREATININE 0.92 11/15/2022   BUN 14 11/15/2022   GFR 86.68 05/15/2016   EGFR 65 11/15/2022   GFRNONAA >60 10/22/2022   GFRAA 75 06/06/2020   NA 138 11/15/2022   K 4.0 11/15/2022   CALCIUM 9.1 11/15/2022   CO2 26 11/15/2022   GLUCOSE 192 (H) 11/15/2022    Lab Results  Component Value Date/Time   HGBA1C 7.1 (H) 10/01/2022 12:15 PM   HGBA1C 7.4 (H) 06/14/2022 04:58 PM   FRUCTOSAMINE 290 (H) 05/15/2016 01:35 PM   FRUCTOSAMINE 282 07/01/2015 10:41 AM   GFR 86.68 05/15/2016 01:35 PM   GFR 82.17 04/17/2016 09:55 AM   MICROALBUR 10 06/06/2020 01:23 PM   MICROALBUR 10 01/06/2020 12:16 PM    Last diabetic Eye exam:  Lab Results  Component Value Date/Time   HMDIABEYEEXA No Retinopathy 09/25/2022 10:47 AM    Last diabetic Foot exam: No results found for: "HMDIABFOOTEX"   Lab Results  Component Value Date   CHOL 145  05/04/2021   HDL 79 05/04/2021   LDLCALC 63 05/04/2021   LDLDIRECT 66.0 01/27/2015   TRIG 16 05/04/2021   CHOLHDL 1.8 05/04/2021       Latest Ref Rng & Units 10/22/2022    1:23 PM 07/26/2022    6:59 AM 07/25/2022    2:43 PM  Hepatic Function  Total Protein 6.5 - 8.1 g/dL 7.8  6.3  6.9   Albumin 3.5 - 5.0 g/dL 4.1  2.9  3.3   AST 15 - 41 U/L 19  14  17    ALT 0 - 44 U/L 16  10  9    Alk Phosphatase 38 - 126 U/L 112  74  83   Total Bilirubin 0.3 - 1.2 mg/dL 0.3  0.6  0.2     Lab Results  Component Value Date/Time   TSH 2.010 03/27/2021 03:36 PM   TSH 1.500 11/09/2019 12:21 AM   TSH 1.332 02/25/2019 03:32 AM   TSH 0.768 08/13/2010 08:36 AM       Latest Ref Rng & Units 11/15/2022   11:33 AM 10/22/2022  1:23 PM 10/01/2022   12:15 PM  CBC  WBC 3.4 - 10.8 x10E3/uL 5.0  4.5  4.2   Hemoglobin 11.1 - 15.9 g/dL 8.1  8.3  7.0   Hematocrit 34.0 - 46.6 % 27.9  27.5  24.1   Platelets 150 - 450 x10E3/uL 442  374  348     Lab Results  Component Value Date/Time   VITAMINB12 320 10/22/2022 01:23 PM   VITAMINB12 519 06/14/2022 04:58 PM    Clinical ASCVD: Yes       10/01/2022   11:12 AM 07/30/2022   11:59 AM 07/18/2022   11:10 AM  Depression screen PHQ 2/9  Decreased Interest 0 0 0  Down, Depressed, Hopeless 0 0 0  PHQ - 2 Score 0 0 0  Altered sleeping 3 0   Tired, decreased energy 3 0   Change in appetite 0 0   Feeling bad or failure about yourself  0 0   Trouble concentrating 1 0   Moving slowly or fidgety/restless 0 0   Suicidal thoughts 0 0   PHQ-9 Score 7 0   Difficult doing work/chores Somewhat difficult Not difficult at all      Social History   Tobacco Use  Smoking Status Former   Packs/day: 0.50   Years: 25.00   Additional pack years: 0.00   Total pack years: 12.50   Types: Cigarettes  Smokeless Tobacco Never  Tobacco Comments   quit smoking 20+ytrs ago   BP Readings from Last 3 Encounters:  11/26/22 (!) 111/53  11/15/22 110/70  11/09/22 (!) 149/77    Pulse Readings from Last 3 Encounters:  11/26/22 67  11/15/22 63  11/09/22 (!) 59   Wt Readings from Last 3 Encounters:  11/26/22 180 lb 12.8 oz (82 kg)  11/15/22 179 lb 3.2 oz (81.3 kg)  10/22/22 179 lb 11.2 oz (81.5 kg)   BMI Readings from Last 3 Encounters:  11/26/22 34.16 kg/m  11/15/22 33.86 kg/m  10/22/22 33.95 kg/m    Allergies  Allergen Reactions   Other Anaphylaxis and Rash    Bleach   Penicillins Hives    Did it involve swelling of the face/tongue/throat, SOB, or low BP? No Did it involve sudden or severe rash/hives, skin peeling, or any reaction on the inside of your mouth or nose? Yes Did you need to seek medical attention at a hospital or doctor's office? Yes When did it last happen?      Childhood allergy  If all above answers are "NO", may proceed with cephalosporin use.     Sulfa Antibiotics Hives   Aspirin Other (See Comments)     On aspirin 81 mg - Rectal bleeding in dec 2018   Codeine     Headache and makes the patient feel "off"    Medications Reviewed Today     Reviewed by Dorann Lodge, RN (Registered Nurse) on 11/26/22 at 1310  Med List Status: <None>   Medication Order Taking? Sig Documenting Provider Last Dose Status Informant  0.9 %  sodium chloride infusion (Manually program via Guardrails IV Fluids) [id number]   Jacklynn Ganong, FNP  Active   0.9 %  sodium chloride infusion (Manually program via Guardrails IV Fluids) [id number]   Jacklynn Ganong, FNP  Active   acetaminophen (TYLENOL) 500 MG tablet [id number] No Take 500-1,000 mg by mouth every 4 (four) hours as needed for moderate pain. [provider] Taking Active Self  albuterol (VENTOLIN HFA) 108 (90 Base) MCG/ACT inhaler  [id number] No Inhale 1-2 puffs into the lungs every 6 (six) hours as needed for wheezing or shortness of breath. Chilton Greathouse, MD Taking Active Self  Alcohol Swabs (DROPSAFE ALCOHOL PREP) 70 % PADS [id number] No USE TWO TIMES DAILY AS DIRECTED  [name], MD Taking Active   apixaban (ELIQUIS) 5 MG TABS tablet [id number] No Take 1 tablet (5 mg total) by mouth 2 (two) times daily. Laurey Morale, MD Taking Active   Ascorbic Acid (VITAMIN C) 1000 MG tablet [id number] No Take 1,000 mg by mouth daily. [provider] Taking Active Self  atorvastatin (LIPITOR) 10 MG tablet [id number] No TAKE 1 TABLET EVERY Josephine Igo, MD Taking Active Self  B-D ULTRAFINE III SHORT PEN 31G X 8 MM MISC [id number] No USE DAILY WITH VICTOZA AND/OR NOVOLOG. [name], MD Taking Active Self  benzonatate (TESSALON) 100 MG capsule [id number] No TAKE 1 CAPSULE THREE TIMES DAILY AS NEEDED FOR COUGH [name], MD Taking Active   Blood Glucose Monitoring Suppl (TRUE METRIX METER) w/Device KIT [id number] No USE AS DIRECTED [name], MD Taking Active   carvedilol (COREG) 12.5 MG tablet [id number] No TAKE 1 TABLET TWICE DAILY Laurey Morale, MD Taking Active Self  dexlansoprazole (DEXILANT) 60 MG capsule [id number] No Take 60 mg by mouth daily. [provider] Taking Active Self  diclofenac sodium (VOLTAREN) 1 % GEL [id number] No Apply 2 g topically 4 (four) times daily as needed (pain). Joseph Art, DO Taking Active Self  FeFum-FePoly-FA-B Cmp-C-Biot (INTEGRA PLUS) CAPS [id number] No Take 1 capsule by mouth every morning. Heilingoetter, Cassandra L, PA-C Taking Active   ferrous sulfate 324 MG TBEC [id number] No Take 324 mg by mouth daily with breakfast. [provider] Taking Active Self  furosemide (LASIX) 20 MG tablet [id number] No TAKE 4 TABLETS (80 MG) BY MOUTH IN THE MORNING AND 3 TABLETS (60 MG) EVERY EVENING.  Patient taking differently: Take 60-80 mg by mouth See admin instructions. Take 80 mg in the morning and 60 mg in the evening   River[place], Anderson Malta, FNP Taking Active Self  Glucagon (GVOKE HYPOPEN 2-PACK) 0.5 MG/0.1ML SOAJ [id number] No INJECT 0.5 MG (1 PEN) INTO THE SKIN DAILY AS NEEDED. [name], MD  Taking Active   guaiFENesin-dextromethorphan [facility] DM) 100-10 MG/5ML syrup [id number] No Take 10 mLs by mouth every 6 (six) hours as needed for cough. [place], Eric J, DO Taking Active Self  hydrALAZINE (APRESOLINE) 100 MG tablet [id number] No Take 1 tablet (100 mg total) by mouth 3 (three) times daily. Laurey Morale, MD Taking Active Self  hydrocortisone cream 1 % [id number] No Apply 1 application topically daily as needed for itching.  [provider] Taking Active Self  insulin glargine, 1 Unit Dial, (TOUJEO) 300 UNIT/ML Solostar Pen [id number] No Inject 20 Units into the skin at bedtime. [provider] Taking Active Self  insulin lispro (HUMALOG KWIKPEN) 100 UNIT/ML KwikPen [id number] No Inject 0.02-0.04 mLs (2-4 Units total) into the skin 3 (three) times daily as needed (high blood sugar over 150).  Patient taking differently: Inject 0-10 Units into the skin See admin instructions. Sliding scale Inject  (high blood sugar over 150).  150-199=2 units 200-249=4 units 250-299=6 units 300-349=8 units 350 above 10 units Over 400 call physician   Charlesetta Ivory, NP Taking Active Self  Isopropyl Alcohol (ALCOHOL WIPES) 70 % MISC [id number] No Apply 1 each topically 2 (two) times daily. Arnette Felts, FNP Taking Active Self  isosorbide mononitrate (IMDUR) 30 MG 24  hr tablet [id number] No Take 1 tablet (30 mg total) by mouth daily. Tonny Bollman, MD Taking Active   lamoTRIgine (LAMICTAL) 150 MG tablet [id number] No Take 1 tablet (150 mg total) by mouth 2 (two) times daily. Van Clines, MD Taking Active Self  levETIRAcetam (KEPPRA) 500 MG tablet [id number] No Take 2 tablets every night Van Clines, MD Taking Active Self  loratadine (CLARITIN) 10 MG tablet [id number] No Take 1 tablet (10 mg total) by mouth daily. [name], MD Taking Active   ondansetron [facility]) 4 MG tablet [id number] No Take 1 tablet (4 mg total) by mouth every 8 (eight) hours as needed for nausea  or vomiting. [name], MD Taking Active Self  OZEMPIC, 1 MG/DOSE, 4 MG/3ML Namon Cirri [id number] No INJECT 1MG  UNDER THE SKIN ONE TIME WEEKLY [name], MD Taking Active   Pancrelipase, Lip-Prot-Amyl, 25000-79000 units CPEP [id number] No Take 1-2 capsules by mouth See admin instructions. Take 2 capsules in the morning with breakfast, 1 capsule with lunch, and 2 capsules with supper [provider] Taking Active Self           Med Note [name], Smitty Cords   Wed Jan 11, 2021 11:55 AM)    potassium chloride SA (KLOR-CON M) 20 MEQ tablet [id number] No Take 2 tablets (40 mEq total) by mouth daily. With additional on metolazone days  Patient taking differently: Take 40 mEq by mouth daily. With additional on metolazone days. Per patient taking 2 tablets in the am and 1 tablet at night   Laurey Morale, MD Taking Active   sennosides-docusate sodium (SENOKOT-S) 8.6-50 MG tablet [id number] No Take 1-2 tablets by mouth at bedtime as needed for constipation. [provider] Taking Active Self  sertraline (ZOLOFT) 50 MG tablet [id number] No TAKE 1 TABLET EVERY DAY [name], MD Taking Active   tiZANidine (ZANAFLEX) 2 MG tablet [id number] No TAKE 1 TABLET AT BEDTIME AS NEEDED [name], MD Taking Active Self  tolnaftate (TINACTIN) 1 % cream [id number] No Apply 1 application topically daily as needed (foot fungus).  [provider] Taking Active Self  TRUE METRIX BLOOD GLUCOSE TEST test strip [id number] No USE AS DIRECTED TO CHECK BLOOD SUGARS 2 TIMES PER DAY [name], MD Taking Active Self  TRUEplus Lancets 33G MISC [id number] No TEST BLOOD SUGAR TWICE DAILY AS DIRECTED [name], MD Taking Active Self            SDOH:  (Social Determinants of Health) assessments and interventions performed: Yes SDOH Interventions    Flowsheet Row Care Coordination from 11/27/2022 in [facility] Most recent reading at 11/28/2022 10:31 AM Clinical Support  from 07/18/2022 in [facility] Most recent reading at 07/18/2022 11:07 AM Office Visit from 04/10/2021 in [facility] Most recent reading at 05/21/2021 10:53 PM Chronic Care Management from 04/26/2021 in [facility] Most recent reading at 04/26/2021  2:49 PM Office Visit from 01/09/2021 in [facility] Most recent reading at 01/09/2021  3:46 PM Chronic Care Management from 02/01/2020 in [facility] Most recent reading at 02/01/2020  5:44 PM  SDOH Interventions        Food Insecurity Interventions -- Intervention Not Indicated -- -- -- --  Transportation Interventions Patient Resources (Friends/Family) Intervention Not Indicated -- [employer] -- --  Depression Interventions/Treatment  -- -- Currently on Treatment -- Medication --  Financial  Strain Interventions -- Intervention Not Indicated -- -- -- Intervention Not Indicated  Physical Activity Interventions -- Patient Refused, Other (Comments) -- -- -- --  Stress Interventions -- Intervention Not Indicated -- -- -- --       Medication Assistance: None required.  Patient affirms current coverage meets needs.  Medication Access: Name and location of current pharmacy:  [facility] - [place], [place] - [address] [place] [address] Phone: [phone] Fax: [phone]  [facility] [place], [place] - [address] [place] [address] Phone: [phone] Fax: [phone]  Within the past 30 days, how often has patient missed a dose of medication? 4-5 Is a pillbox or other method used to improve adherence? Yes  Factors that may affect medication adherence? transportation problems, nonadherence to medications, and lack of understanding of disease  management Are meds synced by current pharmacy? No  Are meds delivered by current pharmacy? Yes  Does patient experience delays in picking up medications due to transportation concerns? Yes   Compliance/Adherence/Medication fill history: Care Gaps: Care Gaps: Annual wellness visit in last year? Yes   If Diabetic: Last eye exam / retinopathy screening:09-25-2022  Star Rating Drugs:  Atorvastatin 10 mg- Last filled 10-01-2022 90 DS [employer]. Previous 07-25-2022 90 DS. Ozempic 1 mg- Last filled 09-19-2022 84 DS [employer]. Previous 07-06-2022 84 DS.   Assessment/Plan   Heart Failure (Goal: manage symptoms and prevent exacerbations) -Not ideally controlled -Last ejection fraction: 60-65% (Date: 07/2022) -HF type: Diastolic -NYHA Class: II (slight limitation of activity) -AHA HF Stage: C (Heart disease and symptoms present) -Current treatment: Hydralazine 100 mg tablet take 1 tablet three times per day Appropriate, Effective, Safe, Accessible Furosemide 20 mg tablet - take 4 tablets by mouth in the morning and 3 tables every evening Appropriate, Effective, Safe, Accessible Carvedilol 12.5 mg table twice per day Appropriate, Effective, Safe, Accessible Isosorbide Mononitrate 30 mg- take 1 tablet by mouth daily Appropriate, Effective, Safe, Accessible -Current home BP/HR readings: we did not discuss  -Current home daily weights: 183 - 11/27/2022 -Current dietary habits: she is going to continue to limit  her salt -Current exercise habits: did not discuss during this office visit  -Educated on Benefits of medications for managing symptoms and prolonging life Importance of weighing daily; if you gain more than 3 pounds in one day or 5 pounds in one week, please notify your cardiology team, and answer the phone when the PCP and specialist call Proper diuretic administration and potassium supplementation Notified Ms. Virrueta that the cardiology team recommended that she take an extra 20 mg tablet  for the next three days.   -Recommended to continue current medication  Cherylin Mylar, CPP, PharmD Clinical Pharmacist Practitioner [facility] [phone]

## 2022-11-27 NOTE — Telephone Encounter (Signed)
  Cardiomems Remote Monitoring  S/P Cardiomems Implant 11/09/2019   PAD Goal: 20 Most recent reading: 25 suggestive of fluid accumulation   Recommended changes: Continue current diuretic regimen. Please ask to take extra 20 mg x 3 day.   I continue to review and analyze the patients PA pressures weekly (and more often as needed) to bring PA pressures within the optimal range.    Novella Abraha NP-C  3:04 PM

## 2022-11-28 ENCOUNTER — Encounter: Payer: Self-pay | Admitting: Nurse Practitioner

## 2022-11-28 ENCOUNTER — Ambulatory Visit: Payer: Medicare HMO

## 2022-11-28 ENCOUNTER — Ambulatory Visit (INDEPENDENT_AMBULATORY_CARE_PROVIDER_SITE_OTHER): Payer: Medicare HMO | Admitting: Nurse Practitioner

## 2022-11-28 VITALS — BP 100/58 | HR 75 | Temp 98.2°F | Ht 61.0 in | Wt 183.2 lb

## 2022-11-28 DIAGNOSIS — E1122 Type 2 diabetes mellitus with diabetic chronic kidney disease: Secondary | ICD-10-CM | POA: Diagnosis not present

## 2022-11-28 DIAGNOSIS — M7989 Other specified soft tissue disorders: Secondary | ICD-10-CM | POA: Diagnosis not present

## 2022-11-28 DIAGNOSIS — N182 Chronic kidney disease, stage 2 (mild): Secondary | ICD-10-CM | POA: Diagnosis not present

## 2022-11-28 DIAGNOSIS — Z794 Long term (current) use of insulin: Secondary | ICD-10-CM | POA: Diagnosis not present

## 2022-11-28 DIAGNOSIS — M79641 Pain in right hand: Secondary | ICD-10-CM | POA: Diagnosis not present

## 2022-11-28 MED ORDER — TRIAMCINOLONE ACETONIDE 40 MG/ML IJ SUSP
40.0000 mg | Freq: Once | INTRAMUSCULAR | Status: AC
Start: 2022-11-28 — End: 2022-11-28
  Administered 2022-11-28: 40 mg via INTRAMUSCULAR

## 2022-11-28 MED ORDER — ATORVASTATIN CALCIUM 40 MG PO TABS: 40.0000 mg | ORAL_TABLET | Freq: Every day | ORAL | 3 refills | Status: DC

## 2022-11-28 NOTE — Progress Notes (Signed)
[name],acting as a Neurosurgeon for Arnette Felts, FNP.,have documented all relevant documentation on the behalf of Arnette Felts, FNP,as directed by  Arnette Felts, FNP while in the presence of Arnette Felts, FNP.    Subjective:     Patient ID: [name] , female    DOB: [birth year] , 77 y.o.   MRN: [id number]   Chief Complaint  Patient presents with   Hand Pain    HPI  Patient presents today for right hand pain. Patients right hand is swollen. Patient reports having surgery on this hand a few years ago, she went to the surgeon and got 3 injections and a brace in May. Patient reports her surgeon told her she could have gout or arthritis, he prescribed her medication.  Patient reports the pain started May 1st. She is unable to grip her hand. [place] thought a spider bit her. She has been wearing a brace and sleeping with a pillow to elevate her hand. She did eat fish 2 days while [place] the mountains.   BP Readings from Last 3 Encounters: 11/28/22 : (!) 100/58 11/26/22 : (!) 111/53 11/15/22 : 110/70       Past Medical History:  Diagnosis Date   Abnormal liver function     in the past.   Anemia    Arthritis    all over   Bruises easily    Cataract    right eye;immature   CHF (congestive heart failure) (HCC)    Chronic back pain    stenosis   Chronic cough    Chronic kidney disease    COPD (chronic obstructive pulmonary disease) (HCC)    COVID    aug/sept 2021   Demand myocardial infarction [facility]) 2012   Demand Infarction in setting of Pancreatitis --> mild Troponin elevation, NON-OBSTRUCTIVE CAD   Depression    takes Abilify daily as well as Zoloft   Diastolic heart failure 2010   Grade 1 diastolic Dysfunction by Echo    Diverticulosis    DM (diabetes mellitus) (HCC)    takes Victoza daily as well as Lantus and Humalog   Empty sella (HCC)    on MRI in 2009.   Glaucoma    Headache(784.0)    last migraine-4-4yrs ago   History  of blood transfusion    no abnormal reaction noted   History of colon polyps    benign   HTN (hypertension)    takes Benicar,Imdur,and Bystolic daily   Hyperlipidemia    takes Lipitor daily   Joint swelling    Nocturia    Obesity hypoventilation syndrome (HCC)    Obstructive sleep apnea 02/2018   Notably improved split-night study with weight loss from 270 (2017) down to 250 pounds (2019).   Pancreatitis    takes Pancrelipase daily   Parkinson's disease    takes Sinemet daily   Peripheral neuropathy    Pneumonia 2012   Seizures (HCC)    takes Lamictal daily and Primidone nightly;last seizure 2wks ago   Urinary urgency    With increased frequency   Varicose veins of both lower extremities with pain    With edema.  Takes daily Lasix     Family History  Problem Relation Age of Onset   Allergies Father    Heart disease Father        before 20   Heart failure Father    Hypertension Father    Hyperlipidemia Father    Heart  disease Mother    Diabetes Mother    Hypertension Mother    Hyperlipidemia Mother    Hypertension Sister    Heart disease Sister        before 21   Hyperlipidemia Sister    Diabetes Brother    Hypertension Brother    Diabetes Sister    Hypertension Sister    Diabetes Son    Hypertension Son    Cancer Other      Current Outpatient Medications:    acetaminophen (TYLENOL) 500 MG tablet, Take 500-1,000 mg by mouth every 4 (four) hours as needed for moderate pain., Disp: , Rfl:    albuterol (VENTOLIN HFA) 108 (90 Base) MCG/ACT inhaler, Inhale 1-2 puffs into the lungs every 6 (six) hours as needed for wheezing or shortness of breath., Disp: 18 g, Rfl: 5   Alcohol Swabs (DROPSAFE ALCOHOL PREP) 70 % PADS, USE TWO TIMES DAILY AS DIRECTED, Disp: 200 each, Rfl: 3   apixaban (ELIQUIS) 5 MG TABS tablet, Take 1 tablet (5 mg total) by mouth 2 (two) times daily., Disp: 180 tablet, Rfl: 1   Ascorbic Acid (VITAMIN C) 1000 MG tablet, Take 1,000 mg by mouth daily.,  Disp: , Rfl:    B-D ULTRAFINE III SHORT PEN 31G X 8 MM MISC, USE DAILY WITH VICTOZA AND/OR NOVOLOG., Disp: 400 each, Rfl: 2   benzonatate (TESSALON) 100 MG capsule, TAKE 1 CAPSULE THREE TIMES DAILY AS NEEDED FOR COUGH, Disp: 30 capsule, Rfl: 1   Blood Glucose Monitoring Suppl (TRUE METRIX METER) w/Device KIT, USE AS DIRECTED, Disp: 1 kit, Rfl: 1   carvedilol (COREG) 12.5 MG tablet, TAKE 1 TABLET TWICE DAILY, Disp: 180 tablet, Rfl: 3   dexlansoprazole (DEXILANT) 60 MG capsule, Take 60 mg by mouth daily., Disp: , Rfl:    diclofenac sodium (VOLTAREN) 1 % GEL, Apply 2 g topically 4 (four) times daily as needed (pain)., Disp: , Rfl:    FeFum-FePoly-FA-B Cmp-C-Biot (INTEGRA PLUS) CAPS, Take 1 capsule by mouth every morning., Disp: 30 capsule, Rfl: 2   ferrous sulfate 324 MG TBEC, Take 324 mg by mouth daily with breakfast., Disp: , Rfl:    furosemide (LASIX) 20 MG tablet, TAKE 4 TABLETS (80 MG) BY MOUTH IN THE MORNING AND 3 TABLETS (60 MG) EVERY EVENING. (Patient taking differently: Take 60-80 mg by mouth See admin instructions. Take 80 mg in the morning and 60 mg in the evening), Disp: 630 tablet, Rfl: 3   Glucagon (GVOKE HYPOPEN 2-PACK) 0.5 MG/0.1ML SOAJ, INJECT 0.5 MG (1 PEN) INTO THE SKIN DAILY AS NEEDED., Disp: 2 mL, Rfl: 11   guaiFENesin-dextromethorphan (ROBITUSSIN DM) 100-10 MG/5ML syrup, Take 10 mLs by mouth every 6 (six) hours as needed for cough., Disp: 118 mL, Rfl: 0   hydrALAZINE (APRESOLINE) 100 MG tablet, Take 1 tablet (100 mg total) by mouth 3 (three) times daily., Disp: 270 tablet, Rfl: 3   hydrocortisone cream 1 %, Apply 1 application topically daily as needed for itching. , Disp: , Rfl:    insulin glargine, 1 Unit Dial, (TOUJEO) 300 UNIT/ML Solostar Pen, Inject 20 Units into the skin at bedtime., Disp: , Rfl:    insulin lispro (HUMALOG KWIKPEN) 100 UNIT/ML KwikPen, Inject 0.02-0.04 mLs (2-4 Units total) into the skin 3 (three) times daily as needed (high blood sugar over 150). (Patient  taking differently: Inject 0-10 Units into the skin See admin instructions. Sliding scale Inject  (high blood sugar over 150).  150-199=2 units 200-249=4 units 250-299=6 units 300-349=8 units 350  above 10 units Over 400 call physician), Disp: 15 mL, Rfl: 2   Isopropyl Alcohol (ALCOHOL WIPES) 70 % MISC, Apply 1 each topically 2 (two) times daily., Disp: 300 each, Rfl: 3   isosorbide mononitrate (IMDUR) 30 MG 24 hr tablet, Take 1 tablet (30 mg total) by mouth daily., Disp: 90 tablet, Rfl: 3   lamoTRIgine (LAMICTAL) 150 MG tablet, Take 1 tablet (150 mg total) by mouth 2 (two) times daily., Disp: 180 tablet, Rfl: 3   levETIRAcetam (KEPPRA) 500 MG tablet, Take 2 tablets every night, Disp: 180 tablet, Rfl: 3   loratadine (CLARITIN) 10 MG tablet, Take 1 tablet (10 mg total) by mouth daily., Disp: 30 tablet, Rfl: 2   ondansetron (ZOFRAN) 4 MG tablet, Take 1 tablet (4 mg total) by mouth every 8 (eight) hours as needed for nausea or vomiting., Disp: 20 tablet, Rfl: 1   OZEMPIC, 1 MG/DOSE, 4 MG/3ML SOPN, INJECT 1MG  UNDER THE SKIN ONE TIME WEEKLY, Disp: 9 mL, Rfl: 3   Pancrelipase, Lip-Prot-Amyl, 25000-79000 units CPEP, Take 1-2 capsules by mouth See admin instructions. Take 2 capsules in the morning with breakfast, 1 capsule with lunch, and 2 capsules with supper, Disp: , Rfl:    potassium chloride SA (KLOR-CON M) 20 MEQ tablet, Take 2 tablets (40 mEq total) by mouth daily. With additional on metolazone days (Patient taking differently: Take 40 mEq by mouth daily. With additional on metolazone days. Per patient taking 2 tablets in the am and 1 tablet at night), Disp: 300 tablet, Rfl: 3   sennosides-docusate sodium (SENOKOT-S) 8.6-50 MG tablet, Take 1-2 tablets by mouth at bedtime as needed for constipation., Disp: , Rfl:    sertraline (ZOLOFT) 50 MG tablet, TAKE 1 TABLET EVERY DAY, Disp: 90 tablet, Rfl: 3   tiZANidine (ZANAFLEX) 2 MG tablet, TAKE 1 TABLET AT BEDTIME AS NEEDED, Disp: 30 tablet, Rfl: 3    tolnaftate (TINACTIN) 1 % cream, Apply 1 application topically daily as needed (foot fungus). , Disp: , Rfl:    TRUE METRIX BLOOD GLUCOSE TEST test strip, USE AS DIRECTED TO CHECK BLOOD SUGARS 2 TIMES PER DAY, Disp: 200 strip, Rfl: 10   TRUEplus Lancets 33G MISC, TEST BLOOD SUGAR TWICE DAILY AS DIRECTED, Disp: 200 each, Rfl: 3   atorvastatin (LIPITOR) 40 MG tablet, Take 1 tablet (40 mg total) by mouth daily., Disp: 90 tablet, Rfl: 3 No current facility-administered medications for this visit.  Facility-Administered Medications Ordered in Other Visits:    0.9 %  sodium chloride infusion (Manually program via Guardrails IV Fluids), , Intravenous, Once, [place], Jessica M, FNP   0.9 %  sodium chloride infusion (Manually program via Guardrails IV Fluids), , Intravenous, Once, [place], Jessica M, FNP   Allergies  Allergen Reactions   Other Anaphylaxis and Rash    Bleach   Penicillins Hives    Did it involve swelling of the face/tongue/throat, SOB, or low BP? No Did it involve sudden or severe rash/hives, skin peeling, or any reaction on the inside of your mouth or nose? Yes Did you need to seek medical attention at a hospital or doctor's office? Yes When did it last happen?      Childhood allergy  If all above answers are "NO", may proceed with cephalosporin use.     Sulfa Antibiotics Hives   Aspirin Other (See Comments)     On aspirin 81 mg - Rectal bleeding in dec 2018   Codeine     Headache and makes the patient  feel "off"     Review of Systems  Constitutional: Negative.  Negative for activity change and fatigue.  Eyes:  Negative for visual disturbance.  Respiratory: Negative.  Negative for choking, shortness of breath and wheezing.   Cardiovascular: Negative.  Negative for chest pain, palpitations and leg swelling.  Gastrointestinal: Negative.   Endocrine: Negative.  Negative for polydipsia, polyphagia and polyuria.  Musculoskeletal: Negative.   Skin: Negative.   Neurological:   Negative for dizziness, weakness and headaches.  Psychiatric/Behavioral:  Negative for confusion. The patient is not nervous/anxious.      Today's Vitals   11/28/22 1032  BP: (!) 100/58  Pulse: 75  Temp: 98.2 F (36.8 C)  TempSrc: Oral  Weight: 183 lb 3.2 oz (83.1 kg)  Height: 5\' 1"  (1.549 m)  PainSc: 9   PainLoc: Hand   Body mass index is 34.62 kg/m.  Wt Readings from Last 3 Encounters:  11/28/22 183 lb 3.2 oz (83.1 kg)  11/26/22 180 lb 12.8 oz (82 kg)  11/15/22 179 lb 3.2 oz (81.3 kg)    The 10-year ASCVD risk score (Arnett DK, et al., 2019) is: 19.2%   Values used to calculate the score:     Age: 19 years     Sex: Female     Is Non-Hispanic African American: Yes     Diabetic: Yes     Tobacco smoker: No     Systolic Blood Pressure: 100 mmHg     Is BP treated: Yes     HDL Cholesterol: 79 mg/dL     Total Cholesterol: 145 mg/dL ++ Objective:  Physical Exam Vitals reviewed.  Constitutional:      General: She is not in acute distress.    Appearance: Normal appearance.  Pulmonary:     Effort: Pulmonary effort is normal. No respiratory distress.  Musculoskeletal:        General: Tenderness (right wrist and base of fingers worse to the index finger, also slightly hot to touch) present.  Neurological:     General: No focal deficit present.     Mental Status: She is alert and oriented to person, place, and time.  Psychiatric:        Mood and Affect: Mood normal.        Behavior: Behavior normal.        Thought Content: Thought content normal.        Judgment: Judgment normal.         Assessment And Plan:     1. Right hand pain Comments: Tenderness to wrist and based of fingers mostly index. Will check uric acid for possible gout flare. Also continue f/u with Orthopedics - Uric acid - triamcinolone acetonide (KENALOG-40) injection 40 mg  2. Swelling of right hand Comments: Swelling noted to hand, slightly on the wrist. Encouraged to continue elevating her hand  and to wear her brace when possible. - Uric acid - triamcinolone acetonide (KENALOG-40) injection 40 mg  3. Type 2 diabetes mellitus with stage 2 chronic kidney disease, with long-term current use of insulin (HCC) Comments: Advised to monitor her blood sugar after having Kenalog injection    Return if symptoms worsen or fail to improve.   Patient was given opportunity to ask questions. Patient verbalized understanding of the plan and was able to repeat key elements of the plan. All questions were answered to their satisfaction.  Arnette Felts, FNP   I, Arnette Felts, FNP, have reviewed all documentation for this visit. The documentation on 11/28/22 for  the exam, diagnosis, procedures, and orders are all accurate and complete.   IF YOU HAVE BEEN REFERRED TO A SPECIALIST, IT MAY TAKE 1-2 WEEKS TO SCHEDULE/PROCESS THE REFERRAL. IF YOU HAVE NOT HEARD FROM US/SPECIALIST IN TWO WEEKS, PLEASE GIVE [place] A CALL AT [phone].   THE PATIENT IS ENCOURAGED TO PRACTICE SOCIAL DISTANCING DUE TO THE COVID-19 PANDEMIC.

## 2022-11-28 NOTE — Telephone Encounter (Signed)
Pt aware.

## 2022-11-28 NOTE — Patient Instructions (Addendum)
Hand Pain Hand pain can make it hard to do daily activities. Many things can cause hand pain. Some common causes are: Injuries. These may include: Broken bones (fractures)and cuts. Overuse injuries from doing the same movements many times (repetitive activity). Arthritis. Lumps in the tendons or joints of the hand and wrist (ganglion cysts). Nerve compression syndromes (carpal tunnel syndrome). Inflammation of the tendons (tendinitis). Infection. Follow these instructions at home: Managing pain, stiffness, and swelling     Take over-the-counter and prescription medicines only as told by your health care provider. If told, put ice on the affected area. Put ice in a plastic bag. Place a towel between your skin and the bag. Leave the ice on for 20 minutes, 2-3 times a day. If told, apply heat to the affected area before you exercise or as often as told by your provider. Use the heat source that your provider recommends, such as a moist heat pack or a heating pad. Place a towel between your skin and the heat source. Leave the heat on for 20-30 minutes. If your skin turns bright red, remove the ice or heat right away to prevent skin damage. The risk of damage is higher if you cannot feel pain, heat, or cold. Activity Take breaks from repetitive activity often. Minimize stress on your hands and wrists as much as possible. Do stretches or exercises as told by your provider. Do not do activities that make your pain worse. Wear a hand splint or support as told by your provider. Contact a health care provider if: Your pain does not get better after a few days. Your pain gets worse. Your pain affects your ability to do your daily activities. Your hand becomes warm, red, or swollen. Your hand is numb or tingling. Get help right away if: Your hand is extremely swollen or is an unusual shape. Your hand or fingers turn white or blue. You cannot move your hand, wrist, or fingers. This  information is not intended to replace advice given to you by your health care provider. Make sure you discuss any questions you have with your health care provider. Document Revised: 01/17/2022 Document Reviewed: 01/17/2022 Elsevier Patient Education  2024 [employer] 8

## 2022-11-28 NOTE — Addendum Note (Signed)
Addended by: Harlan Stains on: 11/28/2022 10:58 AM   Modules accepted: Orders

## 2022-12-03 ENCOUNTER — Inpatient Hospital Stay: Payer: Medicare HMO

## 2022-12-04 ENCOUNTER — Inpatient Hospital Stay: Payer: Medicare HMO

## 2022-12-04 VITALS — BP 108/58 | HR 65 | Temp 98.6°F

## 2022-12-04 DIAGNOSIS — D500 Iron deficiency anemia secondary to blood loss (chronic): Secondary | ICD-10-CM

## 2022-12-04 DIAGNOSIS — D509 Iron deficiency anemia, unspecified: Secondary | ICD-10-CM | POA: Diagnosis not present

## 2022-12-04 DIAGNOSIS — Z79899 Other long term (current) drug therapy: Secondary | ICD-10-CM | POA: Diagnosis not present

## 2022-12-04 MED ORDER — SODIUM CHLORIDE 0.9 % IV SOLN
250.0000 mg | Freq: Once | INTRAVENOUS | Status: AC
  Administered 2022-12-04: 250 mg via INTRAVENOUS
  Filled 2022-12-04: qty 20

## 2022-12-04 MED ORDER — SODIUM CHLORIDE 0.9 % IV SOLN: Freq: Once | INTRAVENOUS | Status: AC

## 2022-12-04 MED ORDER — ACETAMINOPHEN 325 MG PO TABS
650.0000 mg | ORAL_TABLET | Freq: Once | ORAL | Status: AC
  Administered 2022-12-04: 650 mg via ORAL
  Filled 2022-12-04: qty 2

## 2022-12-04 MED ORDER — CETIRIZINE HCL 10 MG PO TABS
10.0000 mg | ORAL_TABLET | Freq: Once | ORAL | Status: AC
  Administered 2022-12-04: 10 mg via ORAL
  Filled 2022-12-04: qty 1

## 2022-12-04 NOTE — Progress Notes (Signed)
Pt declined 30 min observation period post iron infusion. VSS at d/c. Pt brought to the lobby via wheelchair.

## 2022-12-04 NOTE — Patient Instructions (Signed)
Sodium Ferric Gluconate Complex Injection What is this medication? SODIUM FERRIC GLUCONATE COMPLEX (SOE dee um FER ik GLOO koe nate KOM pleks) treats low levels of iron (iron deficiency anemia) in people with kidney disease. Iron is a mineral that plays an important role in making red blood cells, which carry oxygen from your lungs to the rest of your body. This medicine may be used for other purposes; ask your health care provider or pharmacist if you have questions. COMMON BRAND NAME(S): Ferrlecit, Nulecit What should I tell my care team before I take this medication? They need to know if you have any of the following conditions: Anemia that is not from iron deficiency High levels of iron in the blood An unusual or allergic reaction to iron, other medications, foods, dyes, or preservatives Pregnant or are trying to become pregnant Breast-feeding How should I use this medication? This medication is injected into a vein. It is given by your care team in a hospital or clinic setting. Talk to your care team about the use of this medication in children. While it may be prescribed for children as young as 6 years for selected conditions, precautions do apply. Overdosage: If you think you have taken too much of this medicine contact a poison control center or emergency room at once. NOTE: This medicine is only for you. Do not share this medicine with others. What if I miss a dose? It is important not to miss your dose. Call your care team if you are unable to keep an appointment. What may interact with this medication? Do not take this medication with any of the following: Deferasirox Deferoxamine Dimercaprol This medication may also interact with the following: Other iron products This list may not describe all possible interactions. Give your health care provider a list of all the medicines, herbs, non-prescription drugs, or dietary supplements you use. Also tell them if you smoke, drink  alcohol, or use illegal drugs. Some items may interact with your medicine. What should I watch for while using this medication? Your condition will be monitored carefully while you are receiving this medication. Visit your care team for regular checks on your progress. You may need blood work while you are taking this medication. What side effects may I notice from receiving this medication? Side effects that you should report to your care team as soon as possible: Allergic reactions--skin rash, itching, hives, swelling of the face, lips, tongue, or throat Low blood pressure--dizziness, feeling faint or lightheaded, blurry vision Shortness of breath Side effects that usually do not require medical attention (report to your care team if they continue or are bothersome): Flushing Headache Joint pain Muscle pain Nausea Pain, redness, or irritation at injection site This list may not describe all possible side effects. Call your doctor for medical advice about side effects. You may report side effects to FDA at 1-800-FDA-1088. Where should I keep my medication? This medication is given in a hospital or clinic and will not be stored at home. NOTE: This sheet is a summary. It may not cover all possible information. If you have questions about this medicine, talk to your doctor, pharmacist, or health care provider.  2023 [employer] (2020-11-04 00:00:00)  

## 2022-12-15 NOTE — Progress Notes (Unsigned)
[facility] OFFICE PROGRESS NOTE  [name], MD [address] [place] [address]  DIAGNOSIS: Iron deficiency anemia   PRIOR THERAPY:  CURRENT THERAPY: 1) oral iron supplement OTC tablet p.o. daily 2) IV iron as needed most recent with Ferrlecit 250 mg on 12/04/2022  INTERVAL HISTORY: [name] 77 y.o. female returns to the clinic today for a follow-up visit.  The patient was referred to the clinic and seen to establish care on 10/22/2022.  The patient is followed for her history of iron deficiency anemia.  When she was seen, we arrange for IV iron infusions with Ferrlecit, her most recent being on 12/04/2022.  I also sent her prescription for Integra plus which was too expensive so she is taking OTC iron supplements.    While she has some improvement in her hemoglobin compared to 1 month ago, the patient denies any major improvement in her symptoms.  Regarding her fatigue it is stable.  She reports some days she feels like she can "move mountains" and other days she feels like she is "dragging".  She reports similar dyspnea on exertion and sometimes she needs to rest in between activities.  Of note she is expected to have an upcoming cardiac procedure for atrial septal defect by her cardiologist.  She has a history of GI bleeding and follows with Dr. Dulce Sellar.  She reports she has not seen Dr. Dulce Sellar in the interval since last being seen.  She denies any significant visible gingival bleeding, epistaxis, hemoptysis, hematemesis, or melena.  She reports sometimes her stools are dark but she is not sure if this is attributed to blood or her iron supplement. The patient does take Eliquis.  Denies any NSAID use.  She is not a vegan or vegetarian, although she does not eat a lot of red meat due to preference.  No history of bariatric surgery.  She does follow closely with pulmonary medicine for pulmonary fibrosis and chronic cough.  The patient is here today for  evaluation repeat blood work.     MEDICAL HISTORY: Past Medical History:  Diagnosis Date   Abnormal liver function     in the past.   Anemia    Arthritis    all over   Bruises easily    Cataract    right eye;immature   CHF (congestive heart failure) (HCC)    Chronic back pain    stenosis   Chronic cough    Chronic kidney disease    COPD (chronic obstructive pulmonary disease) (HCC)    COVID    aug/sept 2021   Demand myocardial infarction [facility]) 2012   Demand Infarction in setting of Pancreatitis --> mild Troponin elevation, NON-OBSTRUCTIVE CAD   Depression    takes Abilify daily as well as Zoloft   Diastolic heart failure 2010   Grade 1 diastolic Dysfunction by Echo    Diverticulosis    DM (diabetes mellitus) (HCC)    takes Victoza daily as well as Lantus and Humalog   Empty sella (HCC)    on MRI in 2009.   Glaucoma    Headache(784.0)    last migraine-4-64yrs ago   History of blood transfusion    no abnormal reaction noted   History of colon polyps    benign   HTN (hypertension)    takes Benicar,Imdur,and Bystolic daily   Hyperlipidemia    takes Lipitor daily   Joint swelling    Nocturia    Obesity hypoventilation syndrome (HCC)  Obstructive sleep apnea 02/2018   Notably improved split-night study with weight loss from 270 (2017) down to 250 pounds (2019).   Pancreatitis    takes Pancrelipase daily   Parkinson's disease    takes Sinemet daily   Peripheral neuropathy    Pneumonia 2012   Seizures (HCC)    takes Lamictal daily and Primidone nightly;last seizure 2wks ago   Urinary urgency    With increased frequency   Varicose veins of both lower extremities with pain    With edema.  Takes daily Lasix    ALLERGIES:  is allergic to other, penicillins, sulfa antibiotics, aspirin, and codeine.  MEDICATIONS:  Current Outpatient Medications  Medication Sig Dispense Refill   acetaminophen (TYLENOL) 500 MG tablet Take 500-1,000 mg by mouth every 4 (four)  hours as needed for moderate pain.     albuterol (VENTOLIN HFA) 108 (90 Base) MCG/ACT inhaler Inhale 1-2 puffs into the lungs every 6 (six) hours as needed for wheezing or shortness of breath. 18 g 5   Alcohol Swabs (DROPSAFE ALCOHOL PREP) 70 % PADS USE TWO TIMES DAILY AS DIRECTED 200 each 3   apixaban (ELIQUIS) 5 MG TABS tablet Take 1 tablet (5 mg total) by mouth 2 (two) times daily. 180 tablet 1   Ascorbic Acid (VITAMIN C) 1000 MG tablet Take 1,000 mg by mouth daily.     atorvastatin (LIPITOR) 40 MG tablet Take 1 tablet (40 mg total) by mouth daily. 90 tablet 3   B-D ULTRAFINE III SHORT PEN 31G X 8 MM MISC USE DAILY WITH VICTOZA AND/OR NOVOLOG. 400 each 2   benzonatate (TESSALON) 100 MG capsule TAKE 1 CAPSULE THREE TIMES DAILY AS NEEDED FOR COUGH 30 capsule 1   Blood Glucose Monitoring Suppl (TRUE METRIX METER) w/Device KIT USE AS DIRECTED 1 kit 1   carvedilol (COREG) 12.5 MG tablet TAKE 1 TABLET TWICE DAILY 180 tablet 3   dexlansoprazole (DEXILANT) 60 MG capsule Take 60 mg by mouth daily.     diclofenac sodium (VOLTAREN) 1 % GEL Apply 2 g topically 4 (four) times daily as needed (pain).     FeFum-FePoly-FA-B Cmp-C-Biot (INTEGRA PLUS) CAPS Take 1 capsule by mouth every morning. 30 capsule 2   ferrous sulfate 324 MG TBEC Take 324 mg by mouth daily with breakfast.     furosemide (LASIX) 20 MG tablet TAKE 4 TABLETS (80 MG) BY MOUTH IN THE MORNING AND 3 TABLETS (60 MG) EVERY EVENING. (Patient taking differently: Take 60-80 mg by mouth See admin instructions. Take 80 mg in the morning and 60 mg in the evening) 630 tablet 3   Glucagon (GVOKE HYPOPEN 2-PACK) 0.5 MG/0.1ML SOAJ INJECT 0.5 MG (1 PEN) INTO THE SKIN DAILY AS NEEDED. 2 mL 11   guaiFENesin-dextromethorphan (ROBITUSSIN DM) 100-10 MG/5ML syrup Take 10 mLs by mouth every 6 (six) hours as needed for cough. 118 mL 0   hydrALAZINE (APRESOLINE) 100 MG tablet Take 1 tablet (100 mg total) by mouth 3 (three) times daily. 270 tablet 3   hydrocortisone  cream 1 % Apply 1 application topically daily as needed for itching.      insulin glargine, 1 Unit Dial, (TOUJEO) 300 UNIT/ML Solostar Pen Inject 20 Units into the skin at bedtime.     insulin lispro (HUMALOG KWIKPEN) 100 UNIT/ML KwikPen Inject 0.02-0.04 mLs (2-4 Units total) into the skin 3 (three) times daily as needed (high blood sugar over 150). (Patient taking differently: Inject 0-10 Units into the skin See admin instructions. Sliding scale Inject  (  high blood sugar over 150).  150-199=2 units 200-249=4 units 250-299=6 units 300-349=8 units 350 above 10 units Over 400 call physician) 15 mL 2   Isopropyl Alcohol (ALCOHOL WIPES) 70 % MISC Apply 1 each topically 2 (two) times daily. 300 each 3   isosorbide mononitrate (IMDUR) 30 MG 24 hr tablet Take 1 tablet (30 mg total) by mouth daily. 90 tablet 3   lamoTRIgine (LAMICTAL) 150 MG tablet Take 1 tablet (150 mg total) by mouth 2 (two) times daily. 180 tablet 3   levETIRAcetam (KEPPRA) 500 MG tablet Take 2 tablets every night 180 tablet 3   loratadine (CLARITIN) 10 MG tablet Take 1 tablet (10 mg total) by mouth daily. 30 tablet 2   ondansetron (ZOFRAN) 4 MG tablet Take 1 tablet (4 mg total) by mouth every 8 (eight) hours as needed for nausea or vomiting. 20 tablet 1   OZEMPIC, 1 MG/DOSE, 4 MG/3ML SOPN INJECT 1MG  UNDER THE SKIN ONE TIME WEEKLY 9 mL 3   Pancrelipase, Lip-Prot-Amyl, 25000-79000 units CPEP Take 1-2 capsules by mouth See admin instructions. Take 2 capsules in the morning with breakfast, 1 capsule with lunch, and 2 capsules with supper     potassium chloride SA (KLOR-CON M) 20 MEQ tablet Take 2 tablets (40 mEq total) by mouth daily. With additional on metolazone days (Patient taking differently: Take 40 mEq by mouth daily. With additional on metolazone days. Per patient taking 2 tablets in the am and 1 tablet at night) 300 tablet 3   sennosides-docusate sodium (SENOKOT-S) 8.6-50 MG tablet Take 1-2 tablets by mouth at bedtime  as needed for constipation.     sertraline (ZOLOFT) 50 MG tablet TAKE 1 TABLET EVERY DAY 90 tablet 3   tiZANidine (ZANAFLEX) 2 MG tablet TAKE 1 TABLET AT BEDTIME AS NEEDED 30 tablet 3   tolnaftate (TINACTIN) 1 % cream Apply 1 application topically daily as needed (foot fungus).      TRUE METRIX BLOOD GLUCOSE TEST test strip USE AS DIRECTED TO CHECK BLOOD SUGARS 2 TIMES PER DAY 200 strip 10   TRUEplus Lancets 33G MISC TEST BLOOD SUGAR TWICE DAILY AS DIRECTED 200 each 3   No current facility-administered medications for this visit.   Facility-Administered Medications Ordered in Other Visits  Medication Dose Route Frequency Provider Last Rate Last Admin   0.9 %  sodium chloride infusion (Manually program via Guardrails IV Fluids)   Intravenous Once [place], Anderson Malta, FNP       0.9 %  sodium chloride infusion (Manually program via Guardrails IV Fluids)   Intravenous Once [place], Anderson Malta, FNP        SURGICAL HISTORY:  Past Surgical History:  Procedure Laterality Date   ABDOMINAL HYSTERECTOMY     APPENDECTOMY     BACK SURGERY     Cardiac Event Monitor  September-October 2017   Sinus rhythm with occasional PACs and artifact. No arrhythmias besides one short run of tachycardia.   CARDIOVERSION N/A 08/15/2022   Procedure: CARDIOVERSION;  Surgeon: Laurey Morale, MD;  Location: [facility] ENDOSCOPY;  Service: Cardiovascular;  Laterality: N/A;   CHOLECYSTECTOMY     COLONOSCOPY N/A 08/15/2012   Procedure: COLONOSCOPY;  Surgeon: Willis Modena, MD;  Location: [facility] ENDOSCOPY;  Service: Endoscopy;  Laterality: N/A;   COLONOSCOPY WITH PROPOFOL Left 06/17/2017   Procedure: COLONOSCOPY WITH PROPOFOL;  Surgeon: Kerin Salen, MD;  Location: [facility] ENDOSCOPY;  Service: Gastroenterology;  Laterality: Left;   COLONOSCOPY WITH PROPOFOL N/A 04/03/2021   Procedure: COLONOSCOPY WITH  PROPOFOL;  Surgeon: Vida Rigger, MD;  Location: [facility] ENDOSCOPY;  Service: Endoscopy;  Laterality: N/A;   CORONARY CALCIUM SCORE AND CTA   04/2018   Coronary calcium score 71.9.  Very large, hyperdynamic LAD wrapping apex giving rise to PDA.  Moderate LAD-diagonal and circumflex plaque -> CT FFR suggested positive findings in distal D1, D2 and distal circumflex.  Referred for cath. ==> FALSE POSITIVE   ESOPHAGOGASTRODUODENOSCOPY     eye cysts Bilateral    LASIK     LEFT HEART CATH AND CORONARY ANGIOGRAPHY N/A 05/16/2018   Procedure: LEFT HEART CATH AND CORONARY ANGIOGRAPHY;  Surgeon: Lyn Records, MD;  Location: [facility] INVASIVE CV LAB:  Angiographically normal coronary arteries with LVEDP of 21 mmHg.  Large draping hyperdominant LAD that wraps the apex and provides distal half of the PDA.  Relatively small caliber distal Cx -->proxLPDA, non-dom RCA. No Cx or Diag lesions -- FALSE + CT FFR   LEFT HEART CATH AND CORONARY ANGIOGRAPHY  2009/March 2012   2009: (Dr. Janene Harvey) Nonobstructive CAD; 2012: Minimal CAD --> false positive stress test   NM MYOVIEW LTD  01/2016; 01/2018   a)LOW RISK. No ischemia or infarction;; b) EF 55-60%. NO ST changes.  No ischemia or Infarction. NO significant RV enlargement.  LOW RISK.   POLYPECTOMY  04/03/2021   Procedure: POLYPECTOMY;  Surgeon: Vida Rigger, MD;  Location: [facility] ENDOSCOPY;  Service: Endoscopy;;   Polysomnogram  02/2018   (Dr. Vickey Huger from neurology): Split study was not adequately done due to low AHI.  She slept reclined and had an AHI less than 10, prolonged hypoxemia and no hypercapnia. -->  She has home oxygen already prescribed, but did not meet criteria for nightly oxygen.  (Was noted that the patient did not cooperate well with study.  Titration was discussed.   PRESSURE SENSOR/CARDIOMEMS N/A 11/09/2019   Procedure: PRESSURE SENSOR/CARDIOMEMS;  Surgeon: Laurey Morale, MD;  Location: [facility] INVASIVE CV LAB;  Service: Cardiovascular;  Laterality: N/A;   RECTAL POLYPECTOMY     RIGHT HEART CATH N/A 09/07/2022   Procedure: RIGHT HEART CATH;  Surgeon: Laurey Morale, MD;  Location: [facility] INVASIVE CV  LAB;  Service: Cardiovascular;  Laterality: N/A;   RIGHT/LEFT HEART CATH AND CORONARY ANGIOGRAPHY N/A 04/06/2019   Procedure: RIGHT/LEFT HEART CATH AND CORONARY ANGIOGRAPHY;  Surgeon: Marykay Lex, MD;  Location: [facility] INVASIVE CV LAB; angiographically normal coronary arteries.  Moderately elevated AD LVEDP..  Mild pulmonary hypertension: PA P 56/14 mmHg-mean 31 mmHg.  RAP 10 mmHg.  RV P-EDP 54/4 mmHg - 12 mmHg.  PCWP 11-15 mmHg.  CO-CI: 7.35-3.73.   TEE WITHOUT CARDIOVERSION N/A 08/15/2022   Procedure: TRANSESOPHAGEAL ECHOCARDIOGRAM (TEE);  Surgeon: Laurey Morale, MD;  Location: [facility] ENDOSCOPY;  Service: Cardiovascular;  Laterality: N/A;   TONSILLECTOMY     TRANSTHORACIC ECHOCARDIOGRAM  01/2016; 05/2017   A) Normal LV chamber size with mod LVH pattern. EF 50-55%. Severe LA dilation. Mod RA dilation. PAP elevated at 38 mmHg;; B) Mild concentric LVH.  EF 55-60% & no RWMA.  Grade 2 DD.  Diastolic flattening of the ventricular septum == ? elevated PAP.  Mild aortic valve calcification/sclerosis.  Mod LA dilation.  Mild RV dilation & Mod RA dilation   TRANSTHORACIC ECHOCARDIOGRAM  10/2017; 01/2018   a) EF 55-60%.  No RWMA,  Moderate LA dilation.  Mild LA dilation.  Peak PA pressures ~57 mmHg (moderate);;; b) EF 55-60%.  GRII DD.  Suggestion of RV volume overload with moderate RV dilation.  Severe LA and RA dilation.Marland Kitchen  PA pressure ~67%.   TRANSTHORACIC ECHOCARDIOGRAM  01/2019   Normal LV size and function.  EF 50 to 55%.  Impaired relaxation.  RV appears to have moderately reduced function.  Severely enlarged.  Cannot fully assess RV pressures.  Hypermobile interatrial septum.  Right atrium severely dilated.   TUBAL LIGATION     vaginal cyst removed     several times    REVIEW OF SYSTEMS:   Review of Systems  Constitutional: Positive for fatigue.  Positive for cold intolerance.  Negative for appetite change, chills, fever and unexpected weight change.  HENT: Negative for mouth sores, nosebleeds, sore  throat and trouble swallowing.   Eyes: Negative for eye problems and icterus.  Respiratory: Positive for baseline cough and dyspnea on exertion.  Negative for  hemoptysis and wheezing.   Cardiovascular: Positive for stable bilateral lower extremity swelling.  Negative for chest pain. Gastrointestinal: Negative for abdominal pain, constipation, diarrhea, nausea and vomiting.  Genitourinary: Negative for bladder incontinence, difficulty urinating, dysuria, frequency and hematuria.   Musculoskeletal: Negative for back pain, gait problem, neck pain and neck stiffness.  Skin: Negative for itching and rash.  Neurological: Negative for dizziness, extremity weakness, gait problem, headaches, light-headedness and seizures.  Hematological: Negative for adenopathy. Does not bruise/bleed easily.  Psychiatric/Behavioral: Negative for confusion, depression and sleep disturbance. The patient is not nervous/anxious   PHYSICAL EXAMINATION:  There were no vitals taken for this visit.  ECOG PERFORMANCE STATUS: 1  Physical Exam  Constitutional: Oriented to person, place, and time and well-developed, well-nourished, and in no distress.  HENT:  Head: Normocephalic and atraumatic.  Mouth/Throat: Oropharynx is clear and moist. No oropharyngeal exudate.  Eyes: Conjunctivae are normal. Right eye exhibits no discharge. Left eye exhibits no discharge. No scleral icterus.  Neck: Normal range of motion. Neck supple.  Cardiovascular: Normal rate, regular rhythm, positive for mumur and intact distal pulses.   Pulmonary/Chest: Effort normal and breath sounds normal. No respiratory distress. No wheezes. No rales.  Abdominal: Soft. Bowel sounds are normal. Exhibits no distension and no mass. There is no tenderness.  Musculoskeletal: Normal range of motion. Bilateral lower extremity pitting edema.  Lymphadenopathy:    No cervical adenopathy.  Neurological: Alert and oriented to person, place, and time. Exhibits normal  muscle tone. Ambulates with a cane Skin: Skin is warm and dry. No rash noted. Not diaphoretic. No erythema. No pallor.  Psychiatric: Mood, memory and judgment normal.  Vitals reviewed  LABORATORY DATA: Lab Results  Component Value Date   WBC 5.0 11/15/2022   HGB 8.1 (L) 11/15/2022   HCT 27.9 (L) 11/15/2022   MCV 72 (L) 11/15/2022   PLT 442 11/15/2022      Chemistry      Component Value Date/Time   NA 138 11/15/2022 1133   K 4.0 11/15/2022 1133   CL 99 11/15/2022 1133   CO2 26 11/15/2022 1133   BUN 14 11/15/2022 1133   CREATININE 0.92 11/15/2022 1133   CREATININE 0.95 10/22/2022 1323   CREATININE 0.91 10/04/2016 1144      Component Value Date/Time   CALCIUM 9.1 11/15/2022 1133   ALKPHOS 112 10/22/2022 1323   AST 19 10/22/2022 1323   ALT 16 10/22/2022 1323   BILITOT 0.3 10/22/2022 1323       RADIOGRAPHIC STUDIES:  No results found.   ASSESSMENT/PLAN:  This is a very pleasant 77 year old African-American female referred to the clinic for iron deficiency anemia.   The patient is  currently taking oral iron supplements with OTC iron p.o. daily and she is compliant with this.  She also receives IV iron as needed most recent being with Ferrlecit 250 mg on 12/04/2022.  This patient had several lab studies performed today including a CBC, CMP, iron studies, ferritin, SPEP, B12, folate, and sample blood bank.  The patients labs from today demonstrated anemia with a hemoglobin of 10.1.  Her other lab studies are pending.   Will wait for her iron studies to see if she requires any additional IV iron.  Either way, we will arrange for labs and follow-up visit in approximately 6 weeks.  She reports she sees her PCP in about 2 and half to 3 weeks we will likely recheck her labs at that time.  She is expected to see cardiology for cardiac procedure.  To her knowledge, she does not have any upcoming appointments with GI regarding her history of GI bleeding.  Discussed it may be a  good idea to follow-up with them for close monitoring.  She does intermittently have dark stools but she is not sure if that is attributed to intermittent GI bleeding or her iron supplement.  She did have stool cards performed at her PCPs office a few months ago in February which were positive for blood and we recommended follow up with GI at her last appointment. Reinforced this today.   The patient was advised to call immediately if she has any concerning symptoms in the interval. The patient voices understanding of current disease status and treatment options and is in agreement with the current care plan. All questions were answered. The patient knows to call the clinic with any problems, questions or concerns. We can certainly see the patient much sooner if necessary          No orders of the defined types were placed in this encounter.    The total time spent in the appointment was 20-29 minutes  Laval Cafaro L Isaah Furry, PA-C 12/15/22

## 2022-12-17 ENCOUNTER — Inpatient Hospital Stay (HOSPITAL_BASED_OUTPATIENT_CLINIC_OR_DEPARTMENT_OTHER): Payer: Medicare HMO | Admitting: Physician Assistant

## 2022-12-17 ENCOUNTER — Other Ambulatory Visit: Payer: Self-pay

## 2022-12-17 ENCOUNTER — Inpatient Hospital Stay: Payer: Medicare HMO

## 2022-12-17 VITALS — BP 136/76 | HR 66 | Temp 98.2°F | Resp 18 | Wt 182.0 lb

## 2022-12-17 DIAGNOSIS — D500 Iron deficiency anemia secondary to blood loss (chronic): Secondary | ICD-10-CM | POA: Diagnosis not present

## 2022-12-17 DIAGNOSIS — D509 Iron deficiency anemia, unspecified: Secondary | ICD-10-CM | POA: Diagnosis not present

## 2022-12-17 DIAGNOSIS — Z79899 Other long term (current) drug therapy: Secondary | ICD-10-CM | POA: Diagnosis not present

## 2022-12-17 LAB — CBC WITH DIFFERENTIAL (CANCER CENTER ONLY)
Abs Immature Granulocytes: 0 10*3/uL (ref 0.00–0.07)
Basophils Absolute: 0 10*3/uL (ref 0.0–0.1)
Basophils Relative: 1 %
Eosinophils Absolute: 0.1 10*3/uL (ref 0.0–0.5)
Eosinophils Relative: 3 %
HCT: 32.7 % — ABNORMAL LOW (ref 36.0–46.0)
Hemoglobin: 10.1 g/dL — ABNORMAL LOW (ref 12.0–15.0)
Immature Granulocytes: 0 %
Lymphocytes Relative: 26 %
Lymphs Abs: 1 10*3/uL (ref 0.7–4.0)
MCH: 22.5 pg — ABNORMAL LOW (ref 26.0–34.0)
MCHC: 30.9 g/dL (ref 30.0–36.0)
MCV: 73 fL — ABNORMAL LOW (ref 80.0–100.0)
Monocytes Absolute: 0.4 10*3/uL (ref 0.1–1.0)
Monocytes Relative: 9 %
Neutro Abs: 2.4 10*3/uL (ref 1.7–7.7)
Neutrophils Relative %: 61 %
Platelet Count: 310 10*3/uL (ref 150–400)
RBC: 4.48 MIL/uL (ref 3.87–5.11)
RDW: 24.2 % — ABNORMAL HIGH (ref 11.5–15.5)
WBC Count: 3.9 10*3/uL — ABNORMAL LOW (ref 4.0–10.5)
nRBC: 0 % (ref 0.0–0.2)

## 2022-12-17 LAB — IRON AND IRON BINDING CAPACITY (CC-WL,HP ONLY)
Iron: 48 ug/dL (ref 28–170)
Saturation Ratios: 14 % (ref 10.4–31.8)
TIBC: 354 ug/dL (ref 250–450)
UIBC: 306 ug/dL (ref 148–442)

## 2022-12-17 LAB — SAMPLE TO BLOOD BANK

## 2022-12-17 LAB — FERRITIN: Ferritin: 53 ng/mL (ref 11–307)

## 2022-12-18 ENCOUNTER — Encounter: Payer: Self-pay | Admitting: Physician Assistant

## 2022-12-19 ENCOUNTER — Telehealth: Payer: Self-pay

## 2022-12-19 NOTE — Telephone Encounter (Signed)
This nurse spoke with sister and made her aware that provider has reviewed most recent lab levels and she does not need IV iron at this time.  We will continue to monitor her labs closely and treat accordingly.  Sister acknowledged understanding and has no further questions at this time.

## 2022-12-24 ENCOUNTER — Other Ambulatory Visit: Payer: Self-pay

## 2022-12-24 DIAGNOSIS — Q211 Atrial septal defect, unspecified: Secondary | ICD-10-CM

## 2022-12-25 NOTE — Progress Notes (Signed)
[facility]   MULTIDISCIPLINARY HEART VALVE CLINIC                                     Cardiology Office Note:    Date:  12/28/2022   ID:  [name], DOB 09-Aug-1945, MRN [id number]  PCP:  [name], MD  [facility] Cardiologist:  Bryan Lemma, MD / Dr. Shirlee Latch (AHF)/ Dr. Excell Seltzer, MD (ASD)  Referring MD: [name], MD   Chief Complaint  Patient presents with   Follow-up    Pre ASD closure    History of Present Illness:    [name] is a 77 y.o. female with a hx of pulmonary HTN, OSA, hx of GI bleeding with chronic anemia, hx of CVA, seizure d/o, atrial flutter on Eliquis, chronic diastolic CHF followed by AHF team, and ASD who presents today for pre-procedure evaluation.   [name] is followed closely by Dr. Shirlee Latch for her cardiology care. She was recently diagnosed (07/2022) with atrial flutter and underwent a TEE/DCCV. At that time she was found to have an atrial septal defect with continuous left-to-right flow. She underwent cardioversion at that time and remains on apixaban. She then underwent right heart catheterization to evaluate for hemodynamic significance of her atrial septal defect. This demonstrated a mean RA pressure of 6, pulmonary pressure 57/12 with a mean of 31, and a pulmonary capillary wedge pressure of 13. There was a significant step up from the SVC to the PA with oxygen saturations of 64% and 77%, respectively. The patient's QP/QS is 1.5/1.   She was seen in structural heart consultation by Dr. Excell Seltzer 10/08/22 and was found to be a good candidate for ASD closure if her Hb could remain >8mg /dL however she wished to discuss proceeding with her family prior to scheduling. In follow up with Dr. Excell Seltzer 5/23 with a plan to proceed with closure.   Today she is here with her sister prior to ASD closure and reports that she has been doing well with no SOB, palpitations, LE edema, orthopnea, PND, dizziness, or syncope. Denies bleeding in  stool or urine. She states the last discolored stool that she has had was approximately 4-6 weeks ago. She does state she has had mild mid-sternal chest pressure that is worse with deep inspiration. Looking over prior [facility] from 03/2019, there was no CAD noted. Episodes are rather infrequent with no other associated symptoms. Unfortunately, EKG today appears to show atrial flutter with rate control.   Past Medical History:  Diagnosis Date   Abnormal liver function     in the past.   Anemia    Arthritis    all over   Bruises easily    Cataract    right eye;immature   CHF (congestive heart failure) (HCC)    Chronic back pain    stenosis   Chronic cough    Chronic kidney disease    COPD (chronic obstructive pulmonary disease) (HCC)    COVID    aug/sept 2021   Demand myocardial infarction [facility]) 2012   Demand Infarction in setting of Pancreatitis --> mild Troponin elevation, NON-OBSTRUCTIVE CAD   Depression    takes Abilify daily as well as Zoloft   Diastolic heart failure 2010   Grade 1 diastolic Dysfunction by Echo    Diverticulosis    DM (diabetes mellitus) (HCC)    takes Victoza daily as well as Lantus  and Humalog   Empty sella (HCC)    on MRI in 2009.   Glaucoma    Headache(784.0)    last migraine-4-3yrs ago   History of blood transfusion    no abnormal reaction noted   History of colon polyps    benign   HTN (hypertension)    takes Benicar,Imdur,and Bystolic daily   Hyperlipidemia    takes Lipitor daily   Joint swelling    Nocturia    Obesity hypoventilation syndrome (HCC)    Obstructive sleep apnea 02/2018   Notably improved split-night study with weight loss from 270 (2017) down to 250 pounds (2019).   Pancreatitis    takes Pancrelipase daily   Parkinson's disease    takes Sinemet daily   Peripheral neuropathy    Pneumonia 2012   Seizures (HCC)    takes Lamictal daily and Primidone nightly;last seizure 2wks ago   Urinary urgency    With increased frequency    Varicose veins of both lower extremities with pain    With edema.  Takes daily Lasix    Past Surgical History:  Procedure Laterality Date   ABDOMINAL HYSTERECTOMY     APPENDECTOMY     BACK SURGERY     Cardiac Event Monitor  September-October 2017   Sinus rhythm with occasional PACs and artifact. No arrhythmias besides one short run of tachycardia.   CARDIOVERSION N/A 08/15/2022   Procedure: CARDIOVERSION;  Surgeon: Laurey Morale, MD;  Location: [facility] ENDOSCOPY;  Service: Cardiovascular;  Laterality: N/A;   CHOLECYSTECTOMY     COLONOSCOPY N/A 08/15/2012   Procedure: COLONOSCOPY;  Surgeon: Willis Modena, MD;  Location: [facility] ENDOSCOPY;  Service: Endoscopy;  Laterality: N/A;   COLONOSCOPY WITH PROPOFOL Left 06/17/2017   Procedure: COLONOSCOPY WITH PROPOFOL;  Surgeon: Kerin Salen, MD;  Location: [facility] ENDOSCOPY;  Service: Gastroenterology;  Laterality: Left;   COLONOSCOPY WITH PROPOFOL N/A 04/03/2021   Procedure: COLONOSCOPY WITH PROPOFOL;  Surgeon: Vida Rigger, MD;  Location: [facility] ENDOSCOPY;  Service: Endoscopy;  Laterality: N/A;   CORONARY CALCIUM SCORE AND CTA  04/2018   Coronary calcium score 71.9.  Very large, hyperdynamic LAD wrapping apex giving rise to PDA.  Moderate LAD-diagonal and circumflex plaque -> CT FFR suggested positive findings in distal D1, D2 and distal circumflex.  Referred for cath. ==> FALSE POSITIVE   ESOPHAGOGASTRODUODENOSCOPY     eye cysts Bilateral    LASIK     LEFT HEART CATH AND CORONARY ANGIOGRAPHY N/A 05/16/2018   Procedure: LEFT HEART CATH AND CORONARY ANGIOGRAPHY;  Surgeon: Lyn Records, MD;  Location: [facility] INVASIVE CV LAB:  Angiographically normal coronary arteries with LVEDP of 21 mmHg.  Large draping hyperdominant LAD that wraps the apex and provides distal half of the PDA.  Relatively small caliber distal Cx -->proxLPDA, non-dom RCA. No Cx or Diag lesions -- FALSE + CT FFR   LEFT HEART CATH AND CORONARY ANGIOGRAPHY  2009/March 2012   2009: (Dr. Janene Harvey)  Nonobstructive CAD; 2012: Minimal CAD --> false positive stress test   NM MYOVIEW LTD  01/2016; 01/2018   a)LOW RISK. No ischemia or infarction;; b) EF 55-60%. NO ST changes.  No ischemia or Infarction. NO significant RV enlargement.  LOW RISK.   POLYPECTOMY  04/03/2021   Procedure: POLYPECTOMY;  Surgeon: Vida Rigger, MD;  Location: [facility] ENDOSCOPY;  Service: Endoscopy;;   Polysomnogram  02/2018   (Dr. Vickey Huger from neurology): Split study was not adequately done due to low AHI.  She slept reclined and had an AHI  less than 10, prolonged hypoxemia and no hypercapnia. -->  She has home oxygen already prescribed, but did not meet criteria for nightly oxygen.  (Was noted that the patient did not cooperate well with study.  Titration was discussed.   PRESSURE SENSOR/CARDIOMEMS N/A 11/09/2019   Procedure: PRESSURE SENSOR/CARDIOMEMS;  Surgeon: Laurey Morale, MD;  Location: [facility] INVASIVE CV LAB;  Service: Cardiovascular;  Laterality: N/A;   RECTAL POLYPECTOMY     RIGHT HEART CATH N/A 09/07/2022   Procedure: RIGHT HEART CATH;  Surgeon: Laurey Morale, MD;  Location: [facility] INVASIVE CV LAB;  Service: Cardiovascular;  Laterality: N/A;   RIGHT/LEFT HEART CATH AND CORONARY ANGIOGRAPHY N/A 04/06/2019   Procedure: RIGHT/LEFT HEART CATH AND CORONARY ANGIOGRAPHY;  Surgeon: Marykay Lex, MD;  Location: [facility] INVASIVE CV LAB; angiographically normal coronary arteries.  Moderately elevated AD LVEDP..  Mild pulmonary hypertension: PA P 56/14 mmHg-mean 31 mmHg.  RAP 10 mmHg.  RV P-EDP 54/4 mmHg - 12 mmHg.  PCWP 11-15 mmHg.  CO-CI: 7.35-3.73.   TEE WITHOUT CARDIOVERSION N/A 08/15/2022   Procedure: TRANSESOPHAGEAL ECHOCARDIOGRAM (TEE);  Surgeon: Laurey Morale, MD;  Location: [facility] ENDOSCOPY;  Service: Cardiovascular;  Laterality: N/A;   TONSILLECTOMY     TRANSTHORACIC ECHOCARDIOGRAM  01/2016; 05/2017   A) Normal LV chamber size with mod LVH pattern. EF 50-55%. Severe LA dilation. Mod RA dilation. PAP elevated at 38 mmHg;; B)  Mild concentric LVH.  EF 55-60% & no RWMA.  Grade 2 DD.  Diastolic flattening of the ventricular septum == ? elevated PAP.  Mild aortic valve calcification/sclerosis.  Mod LA dilation.  Mild RV dilation & Mod RA dilation   TRANSTHORACIC ECHOCARDIOGRAM  10/2017; 01/2018   a) EF 55-60%.  No RWMA,  Moderate LA dilation.  Mild LA dilation.  Peak PA pressures ~57 mmHg (moderate);;; b) EF 55-60%.  GRII DD.  Suggestion of RV volume overload with moderate RV dilation.  Severe LA and RA dilation.Marland Kitchen  PA pressure ~67%.   TRANSTHORACIC ECHOCARDIOGRAM  01/2019   Normal LV size and function.  EF 50 to 55%.  Impaired relaxation.  RV appears to have moderately reduced function.  Severely enlarged.  Cannot fully assess RV pressures.  Hypermobile interatrial septum.  Right atrium severely dilated.   TUBAL LIGATION     vaginal cyst removed     several times    Current Medications: Current Meds  Medication Sig   acetaminophen (TYLENOL) 500 MG tablet Take 500-1,000 mg by mouth every 4 (four) hours as needed for moderate pain.   albuterol (VENTOLIN HFA) 108 (90 Base) MCG/ACT inhaler Inhale 1-2 puffs into the lungs every 6 (six) hours as needed for wheezing or shortness of breath.   Alcohol Swabs (DROPSAFE ALCOHOL PREP) 70 % PADS USE TWO TIMES DAILY AS DIRECTED   apixaban (ELIQUIS) 5 MG TABS tablet Take 1 tablet (5 mg total) by mouth 2 (two) times daily.   Ascorbic Acid (VITAMIN C) 1000 MG tablet Take 1,000 mg by mouth in the morning.   aspirin EC 81 MG tablet Take 1 tablet (81 mg total) by mouth daily. ONLY TAKE AFTER STOPPING THE ELIQUIS FOR PROCEDURE.   atorvastatin (LIPITOR) 40 MG tablet Take 1 tablet (40 mg total) by mouth daily.   B-D ULTRAFINE III SHORT PEN 31G X 8 MM MISC USE DAILY WITH VICTOZA AND/OR NOVOLOG.   benzonatate (TESSALON) 100 MG capsule TAKE 1 CAPSULE THREE TIMES DAILY AS NEEDED FOR COUGH   Blood Glucose Monitoring Suppl (TRUE METRIX METER) w/Device  KIT USE AS DIRECTED   carvedilol (COREG) 12.5 MG  tablet TAKE 1 TABLET TWICE DAILY   diclofenac sodium (VOLTAREN) 1 % GEL Apply 2 g topically 4 (four) times daily as needed (pain).   FeFum-FePoly-FA-B Cmp-C-Biot (INTEGRA PLUS) CAPS Take 1 capsule by mouth every morning. (Patient not taking: Reported on 12/26/2022)   ferrous sulfate 324 MG TBEC Take 324 mg by mouth daily with breakfast.   furosemide (LASIX) 20 MG tablet TAKE 4 TABLETS (80 MG) BY MOUTH IN THE MORNING AND 3 TABLETS (60 MG) EVERY EVENING. (Patient taking differently: Take 60-80 mg by mouth See admin instructions. Take 80 mg in the morning and 60 mg in the evening)   Glucagon (GVOKE HYPOPEN 2-PACK) 0.5 MG/0.1ML SOAJ INJECT 0.5 MG (1 PEN) INTO THE SKIN DAILY AS NEEDED.   guaiFENesin-dextromethorphan (ROBITUSSIN DM) 100-10 MG/5ML syrup Take 10 mLs by mouth every 6 (six) hours as needed for cough.   hydrALAZINE (APRESOLINE) 100 MG tablet Take 1 tablet (100 mg total) by mouth 3 (three) times daily.   hydrocortisone cream 1 % Apply 1 application topically daily as needed for itching.    insulin glargine, 1 Unit Dial, (TOUJEO) 300 UNIT/ML Solostar Pen Inject 20 Units into the skin at bedtime.   insulin lispro (HUMALOG KWIKPEN) 100 UNIT/ML KwikPen Inject 0.02-0.04 mLs (2-4 Units total) into the skin 3 (three) times daily as needed (high blood sugar over 150). (Patient taking differently: Inject 0-10 Units into the skin See admin instructions. Sliding scale Inject  (high blood sugar over 150).  150-199=2 units 200-249=4 units 250-299=6 units 300-349=8 units 350 above 10 units Over 400 call physician)   Isopropyl Alcohol (ALCOHOL WIPES) 70 % MISC Apply 1 each topically 2 (two) times daily.   isosorbide mononitrate (IMDUR) 30 MG 24 hr tablet Take 1 tablet (30 mg total) by mouth daily.   lamoTRIgine (LAMICTAL) 150 MG tablet Take 1 tablet (150 mg total) by mouth 2 (two) times daily.   levETIRAcetam (KEPPRA) 500 MG tablet Take 2 tablets every night   loratadine (CLARITIN) 10 MG tablet Take 1  tablet (10 mg total) by mouth daily.   ondansetron (ZOFRAN) 4 MG tablet Take 1 tablet (4 mg total) by mouth every 8 (eight) hours as needed for nausea or vomiting.   oxyCODONE (OXY IR/ROXICODONE) 5 MG immediate release tablet Take 5 mg by mouth every 6 (six) hours as needed (pain.).   OZEMPIC, 1 MG/DOSE, 4 MG/3ML SOPN INJECT 1MG  UNDER THE SKIN ONE TIME WEEKLY (Patient taking differently: Inject 1 mg into the skin every Wednesday at 6 PM. INJECT 1MG  UNDER THE SKIN ONE TIME WEEKLY)   Pancrelipase, Lip-Prot-Amyl, 25000-79000 units CPEP Take 1-2 capsules by mouth See admin instructions. Take 2 capsules in the morning with breakfast, 1 capsule with lunch, and 2 capsules with supper   potassium chloride SA (KLOR-CON M) 20 MEQ tablet Take 2 tablets (40 mEq total) by mouth daily. With additional on metolazone days (Patient taking differently: Take 20-40 mEq by mouth See admin instructions. Take 2 tablets (40 meq) in the morning and take 1 tablet (20 meq) by mouth at night)   sennosides-docusate sodium (SENOKOT-S) 8.6-50 MG tablet Take 1-2 tablets by mouth at bedtime as needed for constipation.   sertraline (ZOLOFT) 50 MG tablet TAKE 1 TABLET EVERY DAY (Patient taking differently: Take 50 mg by mouth every evening.)   tiZANidine (ZANAFLEX) 2 MG tablet TAKE 1 TABLET AT BEDTIME AS NEEDED   tolnaftate (TINACTIN) 1 % cream Apply 1  application topically daily as needed (foot fungus).    TRUE METRIX BLOOD GLUCOSE TEST test strip USE AS DIRECTED TO CHECK BLOOD SUGARS 2 TIMES PER DAY   TRUEplus Lancets 33G MISC TEST BLOOD SUGAR TWICE DAILY AS DIRECTED     Allergies:   Other, Penicillins, Sulfa antibiotics, Aspirin, and Codeine   Social History   Socioeconomic History   Marital status: Divorced    Spouse name: Not on file   Number of children: Not on file   Years of education: Not on file   Highest education level: Not on file  Occupational History   Occupation: Disabled    Comment: CNA  Tobacco Use    Smoking status: Former    Packs/day: 0.50    Years: 25.00    Additional pack years: 0.00    Total pack years: 12.50    Types: Cigarettes   Smokeless tobacco: Never   Tobacco comments:    quit smoking 20+ytrs ago  Vaping Use   Vaping Use: Never used  Substance and Sexual Activity   Alcohol use: No   Drug use: No   Sexual activity: Not Currently    Birth control/protection: Surgical  Other Topics Concern   Not on file  Social History Narrative   She lives in [place]. She has lots of family in the area and is accompanied by her sister.   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   She has a distant history of smoking, quit 20 years ago.   Right handed    Social Determinants of Health   Financial Resource Strain: Low Risk  (07/18/2022)   Overall Financial Resource Strain (CARDIA)    Difficulty of Paying Living Expenses: Not hard at all  Food Insecurity: No Food Insecurity (07/18/2022)   Hunger Vital Sign    Worried About Running Out of Food in the Last Year: Never true    Ran Out of Food in the Last Year: Never true  Transportation Needs: Unmet Transportation Needs (11/28/2022)   PRAPARE - Administrator, Civil Service (Medical): Yes    Lack of Transportation (Non-Medical): Yes  Physical Activity: Inactive (07/18/2022)   Exercise Vital Sign    Days of Exercise per Week: 0 days    Minutes of Exercise per Session: 0 min  Stress: No Stress Concern Present (07/18/2022)   [employer] of Occupational Health - Occupational Stress Questionnaire    Feeling of Stress : Not at all  Social Connections: Not on file     Family History: The patient's family history includes Allergies in her father; Cancer in an other family member; Diabetes in her brother, mother, sister, and son; Heart disease in her father, mother, and sister; Heart failure in her father; Hyperlipidemia in her father, mother, and sister; Hypertension in her brother,  father, mother, sister, sister, and son.  ROS:   Please see the history of present illness.    All other systems reviewed and are negative.  EKGs/Labs/Other Studies Reviewed:    The following studies were reviewed today:  Cardiac Studies & Procedures   CARDIAC CATHETERIZATION  CARDIAC CATHETERIZATION 09/07/2022  Narrative 1. Normal filling pressures 2. Moderate pulmonary hypertension in setting of ASD 3. Qp/Qs 1.5/1  Suspect hemodynamically significant ASD.   CARDIAC CATHETERIZATION  CARDIAC CATHETERIZATION 11/09/2019  Narrative 1. Successful deployment of Cardiomems pressure sensor. 2. Normal RA pressure and PCWP. 3. Moderate pulmonary hypertension, PVR not significantly elevated.  Think PH is due to high  cardiac output. 4. Despite high calculated cardiac output, no evidence for significant intracardiac shunt lesion.   STRESS TESTS  MYOCARDIAL PERFUSION IMAGING 01/29/2018  Narrative  The left ventricular ejection fraction is normal (55-65%).  Nuclear stress EF: 58%.  There was no ST segment deviation noted during stress.  The study is normal.  This is a low risk study.  Normal stress nuclear study with no ischemia or infarction.  Gated ejection fraction 58% with normal wall motion.  Note significant right ventricular enlargement.  Suggest echocardiogram to further assess.   ECHOCARDIOGRAM  ECHOCARDIOGRAM COMPLETE 11/15/2020  Narrative ECHOCARDIOGRAM REPORT    Patient Name:   [name] [facility] Date of Exam: 11/15/2020 Medical Rec #:  [id number]       Height:       60.0 in Accession #:    [id number]      Weight:       198.2 lb Date of Birth:  09/09/1945      BSA:          1.859 m Patient Age:    74 years        BP:           122/79 mmHg Patient Gender: F               HR:           77 bpm. Exam Location:  Inpatient  Procedure: 2D Echo, Cardiac Doppler and Color Doppler  Indications:    Dyspnea R06.00  History:        Patient has prior history of  Echocardiogram examinations, most recent 03/21/2020. CHF, Previous Myocardial Infarction, COPD; Risk Factors:Hypertension, Diabetes, Sleep Apnea and Dyslipidemia. COVID-19. Seizures. CKD.  Sonographer:    Elmarie Shiley Dance Referring Phys: [id number] Kateri Mc LATIF SHEIKH  IMPRESSIONS   1. Left ventricular ejection fraction, by estimation, is 60 to 65%. The left ventricle has normal function. The left ventricle has no regional wall motion abnormalities. There is mild left ventricular hypertrophy. Left ventricular diastolic parameters are consistent with Grade I diastolic dysfunction (impaired relaxation). 2. Right ventricular systolic function is normal. The right ventricular size is normal. There is normal pulmonary artery systolic pressure. 3. Left atrial size was severely dilated. 4. The mitral valve is normal in structure. No evidence of mitral valve regurgitation. No evidence of mitral stenosis. 5. The aortic valve is normal in structure. There is mild calcification of the aortic valve. There is mild thickening of the aortic valve. Aortic valve regurgitation is not visualized. Mild aortic valve sclerosis is present, with no evidence of aortic valve stenosis. 6. The inferior vena cava is normal in size with greater than 50% respiratory variability, suggesting right atrial pressure of 3 mmHg.  FINDINGS Left Ventricle: Left ventricular ejection fraction, by estimation, is 60 to 65%. The left ventricle has normal function. The left ventricle has no regional wall motion abnormalities. The left ventricular internal cavity size was normal in size. There is mild left ventricular hypertrophy. Left ventricular diastolic parameters are consistent with Grade I diastolic dysfunction (impaired relaxation).  Right Ventricle: The right ventricular size is normal. No increase in right ventricular wall thickness. Right ventricular systolic function is normal. There is normal pulmonary artery systolic pressure. The  tricuspid regurgitant velocity is 2.36 m/s, and with an assumed right atrial pressure of 8 mmHg, the estimated right ventricular systolic pressure is 30.3 mmHg.  Left Atrium: Left atrial size was severely dilated.  Right Atrium: Right atrial size was normal in size.  Pericardium:  There is no evidence of pericardial effusion.  Mitral Valve: The mitral valve is normal in structure. No evidence of mitral valve regurgitation. No evidence of mitral valve stenosis.  Tricuspid Valve: The tricuspid valve is normal in structure. Tricuspid valve regurgitation is not demonstrated. No evidence of tricuspid stenosis.  Aortic Valve: The aortic valve is normal in structure. There is mild calcification of the aortic valve. There is mild thickening of the aortic valve. Aortic valve regurgitation is not visualized. Mild aortic valve sclerosis is present, with no evidence of aortic valve stenosis.  Pulmonic Valve: The pulmonic valve was normal in structure. Pulmonic valve regurgitation is trivial. No evidence of pulmonic stenosis.  Aorta: The aortic root is normal in size and structure.  Venous: The inferior vena cava is normal in size with greater than 50% respiratory variability, suggesting right atrial pressure of 3 mmHg.  IAS/Shunts: No atrial level shunt detected by color flow Doppler.   LEFT VENTRICLE PLAX 2D LVIDd:         4.60 cm LVIDs:         2.90 cm LV PW:         1.20 cm LV IVS:        1.10 cm LVOT diam:     2.10 cm LV SV:         70 LV SV Index:   38 LVOT Area:     3.46 cm   RIGHT VENTRICLE          IVC RV Basal diam:  3.80 cm  IVC diam: 2.70 cm RV Mid diam:    2.60 cm TAPSE (M-mode): 1.9 cm  LEFT ATRIUM              Index       RIGHT ATRIUM           Index LA diam:        5.40 cm  2.90 cm/m  RA Area:     19.70 cm LA Vol (A2C):   94.2 ml  50.66 ml/m RA Volume:   56.00 ml  30.12 ml/m LA Vol (A4C):   109.0 ml 58.62 ml/m LA Biplane Vol: 103.0 ml 55.39 ml/m AORTIC  VALVE LVOT Vmax:   108.05 cm/s LVOT Vmean:  69.300 cm/s LVOT VTI:    0.202 m  AORTA Ao Root diam: 2.70 cm Ao Asc diam:  3.00 cm  MITRAL VALVE               TRICUSPID VALVE MV Area (PHT): 2.99 cm    TR Peak grad:   22.3 mmHg MV Decel Time: 254 msec    TR Vmax:        236.00 cm/s MV E velocity: 94.65 cm/s MV A velocity: 80.90 cm/s  SHUNTS MV E/A ratio:  1.17        Systemic VTI:  0.20 m Systemic Diam: 2.10 cm  Donato Schultz MD Electronically signed by Donato Schultz MD Signature Date/Time: 11/15/2020/1:15:09 PM    Final   TEE  ECHO TEE 08/16/2022  Narrative TRANSESOPHOGEAL ECHO REPORT    Patient Name:   [name] [facility] Date of Exam: 08/15/2022 Medical Rec #:  [id number]       Height:       61.0 in Accession #:    [id number]      Weight:       187.0 lb Date of Birth:  27-Oct-1945      BSA:          1.836  m Patient Age:    76 years        BP:           120/68 mmHg Patient Gender: F               HR:           76 bpm. Exam Location:  Inpatient  Procedure: Transesophageal Echo, 3D Echo, Color Doppler and Cardiac Doppler  Indications:     I48.91* Unspecified atrial fibrillation  History:         Patient has prior history of Echocardiogram examinations, most recent 11/15/2020. CHF; Risk Factors:Hypertension, Diabetes, Dyslipidemia and Sleep Apnea.  Sonographer:     Irving Burton Senior RDCS Referring Phys:  [id number] [facility] Diagnosing Phys: Wilfred Lacy  PROCEDURE: After discussion of the risks and benefits of a TEE, an informed consent was obtained from the patient. The transesophogeal probe was passed without difficulty through the esophogus of the patient. Sedation performed by different physician. The patient was monitored while under deep sedation. Anesthestetic sedation was provided intravenously by Anesthesiology: 151mg  of Propofol, 60mg  of Lidocaine. The patient developed no complications during the procedure. A successful direct current cardioversion was performed at  200 joules with 1 attempt.  IMPRESSIONS   1. Left ventricular ejection fraction, by estimation, is 60 to 65%. The left ventricle has normal function. The left ventricle has no regional wall motion abnormalities. There is mild concentric left ventricular hypertrophy. 2. Right ventricular systolic function is normal. The right ventricular size is mildly enlarged. 3. Peak RV-RA gradient 31 mmHg. 4. Measurements done for Watchman. Left atrial size was severely dilated. No left atrial/left atrial appendage thrombus was detected. 5. Right atrial size was mildly dilated. 6. There is a small 1.9 x 1.2 cm relatively high secundum ASD with left to right flow. The pulmonary veins appear to drain normally to the left atrium. 7. The mitral valve is normal in structure. Trivial mitral valve regurgitation. No evidence of mitral stenosis. 8. The aortic valve is tricuspid. Aortic valve regurgitation is not visualized. No aortic stenosis is present.  FINDINGS Left Ventricle: Left ventricular ejection fraction, by estimation, is 60 to 65%. The left ventricle has normal function. The left ventricle has no regional wall motion abnormalities. The left ventricular internal cavity size was normal in size. There is mild concentric left ventricular hypertrophy.  Right Ventricle: The right ventricular size is mildly enlarged. No increase in right ventricular wall thickness. Right ventricular systolic function is normal.  Left Atrium: Measurements done for Watchman. Left atrial size was severely dilated. No left atrial/left atrial appendage thrombus was detected.  Right Atrium: Right atrial size was mildly dilated.  Pericardium: There is no evidence of pericardial effusion.  Mitral Valve: The mitral valve is normal in structure. Trivial mitral valve regurgitation. No evidence of mitral valve stenosis.  Tricuspid Valve: Peak RV-RA gradient 31 mmHg. The tricuspid valve is normal in structure. Tricuspid valve  regurgitation is mild.  Aortic Valve: The aortic valve is tricuspid. Aortic valve regurgitation is not visualized. No aortic stenosis is present.  Pulmonic Valve: The pulmonic valve was normal in structure. Pulmonic valve regurgitation is not visualized.  Aorta: The aortic root is normal in size and structure.  IAS/Shunts: There is a small 1.9 x 1.2 cm relatively high secundum ASD with left to right flow. The pulmonary veins appear to drain normally to the left atrium.  Additional Comments: Spectral Doppler performed.  [employer] Electronically signed by Wilfred Lacy Signature Date/Time: 08/16/2022/3:56:59  PM    Final   MONITORS  LONG TERM MONITOR (3-14 DAYS) 12/02/2019   CT SCANS  CT CORONARY MORPH W/CTA COR W/SCORE 05/15/2018  Addendum 05/15/2018  9:57 PM ADDENDUM REPORT: 05/15/2018 21:54  CLINICAL DATA:  89F with hypertension and diabetes admitted with chest pain.  EXAM: Cardiac/Coronary  CT  TECHNIQUE: The patient was scanned on a [employer].  FINDINGS: A 120 kV prospective scan was triggered in the descending thoracic aorta at 111 HU's. Axial non-contrast 3 mm slices were carried out through the heart. The data set was analyzed on a dedicated work station and scored using the Agatson method. Gantry rotation speed was 250 msecs and collimation was .6 mm. No beta blockade and 0.8 mg of sl NTG was given. The 3D data set was reconstructed in 5% intervals of the 67-82 % of the R-R cycle. Diastolic phases were analyzed on a dedicated work station using MPR, MIP and VRT modes. The patient received 80 cc of contrast.  Aorta: Normal size. Ascending aorta 2.9 cm. Mild calcification of the aortic root. No dissection.  Aortic Valve:  Trileaflet.  No calcifications.  Coronary Arteries:  Normal coronary origin.  Right dominance.  RCA is a small, non-dominant artery. There is minimal (1-24%) non-obstructing mixed plaque in the proximal RCA.  Left  main is a large artery that gives rise to LAD and LCX arteries.  LAD is a very large vessel that wraps the apex and gives rise to the PDA. The LAD has minimal (1-24%) mixed calcified and soft attenuation plaque proximally and mild (25-49%) soft plaque in the mid LAD, worse at the level of D1. There is moderate (25-49%) mixed plaque in the apical LAD.  LCX is a non-dominant artery that gives rise to two OM branches. There is minimal (1-24%) mixed plaque proximally.  Other findings:  Normal pulmonary vein drainage into the left atrium.  Normal let atrial appendage without a thrombus.  Pulmonary artery is enlarged consistent with elevated pulmonary pressures.  IMPRESSION: 1.  Study quality is limited by body habitus.  2. Coronary calcium score of 71.9. This was 77th percentile for age and sex matched control.  2.  Normal coronary origin with hyperdominant LAD.  3. There is mild plaque in the mid LAD and moderate plaque in the distal LAD.  4.  Will send study for FFRct.  5.  Recommend aggressive risk factor modification.  6. Dilation of the pulmonary artery consistent with elevated pulmonary pressures.  Chilton Si, MD   Electronically Signed By: Chilton Si On: 05/15/2018 21:54  Narrative EXAM: OVER-READ INTERPRETATION  CT CHEST  The following report is an over-read performed by radiologist Dr. Genevive Bi of [facility] on 05/15/2018. This over-read does not include interpretation of cardiac or coronary anatomy or pathology. The coronary CTA interpretation by the cardiologist is attached.  COMPARISON:  Chest radiograph 05/15/2018  FINDINGS: Limited view of the lung parenchyma demonstrates mild basilar atelectasis. Airways are normal.  Limited view of the mediastinum demonstrates no adenopathy. Esophagus normal.  Limited view of the upper abdomen unremarkable.  Limited view of the skeleton and chest wall is  unremarkable. Degenerative osteophytosis of the spine.  IMPRESSION: 1. Mild basilar atelectasis. 2. Degenerate spurring of the spine  Electronically Signed: By: Genevive Bi M.D. On: 05/15/2018 19:14          EKG:  EKG is  ordered today.  The ekg ordered today demonstrates atrial flutter with PVCs and incomplete RBBB, HR 76bpm.  Recent Labs: 05/17/2022: Magnesium 2.1 09/28/2022: B Natriuretic Peptide 132.4 10/22/2022: ALT 16 12/26/2022: BUN 13; Creatinine, Ser 0.87; Hemoglobin 9.9; Platelets 290; Potassium 3.3; Sodium 140   Recent Lipid Panel    Component Value Date/Time   CHOL 145 05/04/2021 1014   CHOL 145 10/22/2018 1446   TRIG 16 05/04/2021 1014   HDL 79 05/04/2021 1014   HDL 70 10/22/2018 1446   CHOLHDL 1.8 05/04/2021 1014   VLDL 3 05/04/2021 1014   LDLCALC 63 05/04/2021 1014   LDLCALC 65 10/22/2018 1446   LDLDIRECT 66.0 01/27/2015 1104   Physical Exam:    VS:  BP (!) 142/46   Pulse 76   Ht 5\' 1"  (1.549 m)   Wt 173 lb 6.4 oz (78.7 kg)   SpO2 97%   BMI 32.76 kg/m     Wt Readings from Last 3 Encounters:  12/26/22 173 lb 6.4 oz (78.7 kg)  12/17/22 182 lb (82.6 kg)  11/28/22 183 lb 3.2 oz (83.1 kg)    General: Well developed, well nourished, NAD Neck: Negative for carotid bruits. No JVD Lungs:Clear to ausculation bilaterally. No wheezes, rales, or rhonchi. Breathing is unlabored. Cardiovascular: Irregularly irregular. No murmurs Extremities: No edema.  Neuro: Alert and oriented. No focal deficits. No facial asymmetry. MAE spontaneously. Psych: Responds to questions appropriately with normal affect.    ASSESSMENT/PLAN:    ASD: Scheduled for ASD closure with Dr. Excell Seltzer 7/10. EKG today shows return to atrial flutter with rate control. Will discuss plan with Dr. Excell Seltzer in regards to proceeding with procedure. Obtain CBC, BMET today. Instruction letter reviewed with understanding. Will follow up with recommendations.   Atrial flutter: Underwent TEE/DCCV  06/2022 to NSR and appears that she has remained in sinus since that time. Plan is for ASD closure 7/10 however will discuss with Dr. Excell Seltzer given the need to hold Eliqius prior to procedure.   Chronic diastolic CHF: Followed by Dr. Shirlee Latch. Has Cardiomems in place with most recent reading suggestive of fluid volume overload, 25. She took additional Lasix x 3 days. AHF will continue to remotely monitor.   Pulmonary venous hypertension: Followed closely by AHF. V/Q scan 5/22 with no PE noted.   Hx of GI bleeding: Will monitor very closely with the addition of ASA post ASD closure.   HTN: Stable today with no changes needed  Medication Adjustments/Labs and Tests Ordered: Current medicines are reviewed at length with the patient today.  Concerns regarding medicines are outlined above.  Orders Placed This Encounter  Procedures   CBC   Basic metabolic panel   EKG 12-Lead   Meds ordered this encounter  Medications   aspirin EC 81 MG tablet    Sig: Take 1 tablet (81 mg total) by mouth daily. ONLY TAKE AFTER STOPPING THE ELIQUIS FOR PROCEDURE.    Dispense:  90 tablet    Refill:  3    Patient Instructions  Medication Instructions:  Your physician has recommended you make the following change in your medication:  WHEN YOU STOP THE ELIQUIS, START THE ASPIRIN 81 MG  *If you need a refill on your cardiac medications before your next appointment, please call your pharmacy*   Lab Work: TODAY: BMET, CBC If you have labs (blood work) drawn today and your tests are completely normal, you will receive your results only by: MyChart Message (if you have MyChart) OR A paper copy in the mail If you have any lab test that is abnormal or we need to change your treatment, we  will call you to review the results.   Testing/Procedures: SEE INSTRUCTION LETTER   Follow-Up: At [facility], you and your health needs are our priority.  As part of our continuing mission to provide you with  exceptional heart care, we have created designated Provider Care Teams.  These Care Teams include your primary Cardiologist (physician) and Advanced Practice Providers (APPs -  Physician Assistants and Nurse Practitioners) who all work together to provide you with the care you need, when you need it.  We recommend signing up for the patient portal called "MyChart".  Sign up information is provided on this After Visit Summary.  MyChart is used to connect with patients for Virtual Visits (Telemedicine).  Patients are able to view lab/test results, encounter notes, upcoming appointments, etc.  Non-urgent messages can be sent to your provider as well.   To learn more about what you can do with MyChart, go to [email or web address].    Your next appointment:   POST PROCEDURAL FOLLOW-UP    Signed, Georgie Chard, NP  12/28/2022 12:31 PM    [facility]

## 2022-12-25 NOTE — H&P (View-Only) (Signed)
[facility]   MULTIDISCIPLINARY HEART VALVE CLINIC                                     Cardiology Office Note:    Date:  12/28/2022   ID:  [name], DOB 09/25/1945, MRN [id number]  PCP:  Sanders, Robyn, MD  [facility] Cardiologist:  David Harding, MD / Dr. McLean (AHF)/ Dr. Cooper, MD (ASD)  Referring MD: Sanders, Robyn, MD   Chief Complaint  Patient presents with   Follow-up    Pre ASD closure    History of Present Illness:    [name] is a 77 y.o. female with a hx of pulmonary HTN, OSA, hx of GI bleeding with chronic anemia, hx of CVA, seizure d/o, atrial flutter on Eliquis, chronic diastolic CHF followed by AHF team, and ASD who presents today for pre-procedure evaluation.   Ms. Dauria is followed closely by Dr. McLean for her cardiology care. She was recently diagnosed (07/2022) with atrial flutter and underwent a TEE/DCCV. At that time she was found to have an atrial septal defect with continuous left-to-right flow. She underwent cardioversion at that time and remains on apixaban. She then underwent right heart catheterization to evaluate for hemodynamic significance of her atrial septal defect. This demonstrated a mean RA pressure of 6, pulmonary pressure 57/12 with a mean of 31, and a pulmonary capillary wedge pressure of 13. There was a significant step up from the SVC to the PA with oxygen saturations of 64% and 77%, respectively. The patient's QP/QS is 1.5/1.   She was seen in structural heart consultation by Dr. Cooper 10/08/22 and was found to be a good candidate for ASD closure if her Hb could remain >8mg/dL however she wished to discuss proceeding with her family prior to scheduling. In follow up with Dr. Cooper 5/23 with a plan to proceed with closure.   Today she is here with her sister prior to ASD closure and reports that she has been doing well with no SOB, palpitations, LE edema, orthopnea, PND, dizziness, or syncope. Denies bleeding in  stool or urine. She states the last discolored stool that she has had was approximately 4-6 weeks ago. She does state she has had mild mid-sternal chest pressure that is worse with deep inspiration. Looking over prior R/LHC from 03/2019, there was no CAD noted. Episodes are rather infrequent with no other associated symptoms. Unfortunately, EKG today appears to show atrial flutter with rate control.   Past Medical History:  Diagnosis Date   Abnormal liver function     in the past.   Anemia    Arthritis    all over   Bruises easily    Cataract    right eye;immature   CHF (congestive heart failure) (HCC)    Chronic back pain    stenosis   Chronic cough    Chronic kidney disease    COPD (chronic obstructive pulmonary disease) (HCC)    COVID    aug/sept 2021   Demand myocardial infarction (HCC) 2012   Demand Infarction in setting of Pancreatitis --> mild Troponin elevation, NON-OBSTRUCTIVE CAD   Depression    takes Abilify daily as well as Zoloft   Diastolic heart failure 2010   Grade 1 diastolic Dysfunction by Echo    Diverticulosis    DM (diabetes mellitus) (HCC)    takes Victoza daily as well as Lantus   and Humalog   Empty sella (HCC)    on MRI in 2009.   Glaucoma    Headache(784.0)    last migraine-4-5yrs ago   History of blood transfusion    no abnormal reaction noted   History of colon polyps    benign   HTN (hypertension)    takes Benicar,Imdur,and Bystolic daily   Hyperlipidemia    takes Lipitor daily   Joint swelling    Nocturia    Obesity hypoventilation syndrome (HCC)    Obstructive sleep apnea 02/2018   Notably improved split-night study with weight loss from 270 (2017) down to 250 pounds (2019).   Pancreatitis    takes Pancrelipase daily   Parkinson's disease    takes Sinemet daily   Peripheral neuropathy    Pneumonia 2012   Seizures (HCC)    takes Lamictal daily and Primidone nightly;last seizure 2wks ago   Urinary urgency    With increased frequency    Varicose veins of both lower extremities with pain    With edema.  Takes daily Lasix    Past Surgical History:  Procedure Laterality Date   ABDOMINAL HYSTERECTOMY     APPENDECTOMY     BACK SURGERY     Cardiac Event Monitor  September-October 2017   Sinus rhythm with occasional PACs and artifact. No arrhythmias besides one short run of tachycardia.   CARDIOVERSION N/A 08/15/2022   Procedure: CARDIOVERSION;  Surgeon: McLean, Dalton S, MD;  Location: [facility];  Service: Cardiovascular;  Laterality: N/A;   CHOLECYSTECTOMY     COLONOSCOPY N/A 08/15/2012   Procedure: COLONOSCOPY;  Surgeon: William Outlaw, MD;  Location: [facility];  Service: Endoscopy;  Laterality: N/A;   COLONOSCOPY WITH PROPOFOL Left 06/17/2017   Procedure: COLONOSCOPY WITH PROPOFOL;  Surgeon: Karki, Arya, MD;  Location: [facility];  Service: Gastroenterology;  Laterality: Left;   COLONOSCOPY WITH PROPOFOL N/A 04/03/2021   Procedure: COLONOSCOPY WITH PROPOFOL;  Surgeon: Magod, Marc, MD;  Location: [facility];  Service: Endoscopy;  Laterality: N/A;   CORONARY CALCIUM SCORE AND CTA  04/2018   Coronary calcium score 71.9.  Very large, hyperdynamic LAD wrapping apex giving rise to PDA.  Moderate LAD-diagonal and circumflex plaque -> CT FFR suggested positive findings in distal D1, D2 and distal circumflex.  Referred for cath. ==> FALSE POSITIVE   ESOPHAGOGASTRODUODENOSCOPY     eye cysts Bilateral    LASIK     LEFT HEART CATH AND CORONARY ANGIOGRAPHY N/A 05/16/2018   Procedure: LEFT HEART CATH AND CORONARY ANGIOGRAPHY;  Surgeon: Smith, Henry W, MD;  [facility]:  Angiographically normal coronary arteries with LVEDP of 21 mmHg.  Large draping hyperdominant LAD that wraps the apex and provides distal half of the PDA.  Relatively small caliber distal Cx -->proxLPDA, non-dom RCA. No Cx or Diag lesions -- FALSE + CT FFR   LEFT HEART CATH AND CORONARY ANGIOGRAPHY  2009/March 2012   2009: (Dr. Karakia)  Nonobstructive CAD; 2012: Minimal CAD --> false positive stress test   NM MYOVIEW LTD  01/2016; 01/2018   a)LOW RISK. No ischemia or infarction;; b) EF 55-60%. NO ST changes.  No ischemia or Infarction. NO significant RV enlargement.  LOW RISK.   POLYPECTOMY  04/03/2021   Procedure: POLYPECTOMY;  Surgeon: Magod, Marc, MD;  [facility]: [facility];  Service: Endoscopy;;   Polysomnogram  02/2018   (Dr. Dohmeier from neurology): Split study was not adequately done due to low AHI.  She slept reclined and had an AHI   less than 10, prolonged hypoxemia and no hypercapnia. -->  She has home oxygen already prescribed, but did not meet criteria for nightly oxygen.  (Was noted that the patient did not cooperate well with study.  Titration was discussed.   PRESSURE SENSOR/CARDIOMEMS N/A 11/09/2019   Procedure: PRESSURE SENSOR/CARDIOMEMS;  Surgeon: McLean, Dalton S, MD;  [facility];  Service: Cardiovascular;  Laterality: N/A;   RECTAL POLYPECTOMY     RIGHT HEART CATH N/A 09/07/2022   Procedure: RIGHT HEART CATH;  Surgeon: McLean, Dalton S, MD;  [facility];  Service: Cardiovascular;  Laterality: N/A;   RIGHT/LEFT HEART CATH AND CORONARY ANGIOGRAPHY N/A 04/06/2019   Procedure: RIGHT/LEFT HEART CATH AND CORONARY ANGIOGRAPHY;  Surgeon: Harding, David W, MD;  [facility]; angiographically normal coronary arteries.  Moderately elevated AD LVEDP..  Mild pulmonary hypertension: PA P 56/14 mmHg-mean 31 mmHg.  RAP 10 mmHg.  RV P-EDP 54/4 mmHg - 12 mmHg.  PCWP 11-15 mmHg.  CO-CI: 7.35-3.73.   TEE WITHOUT CARDIOVERSION N/A 08/15/2022   Procedure: TRANSESOPHAGEAL ECHOCARDIOGRAM (TEE);  Surgeon: McLean, Dalton S, MD;  [facility]: [facility];  Service: Cardiovascular;  Laterality: N/A;   TONSILLECTOMY     TRANSTHORACIC ECHOCARDIOGRAM  01/2016; 05/2017   A) Normal LV chamber size with mod LVH pattern. EF 50-55%. Severe LA dilation. Mod RA dilation. PAP elevated at 38 mmHg;; B)  Mild concentric LVH.  EF 55-60% & no RWMA.  Grade 2 DD.  Diastolic flattening of the ventricular septum == ? elevated PAP.  Mild aortic valve calcification/sclerosis.  Mod LA dilation.  Mild RV dilation & Mod RA dilation   TRANSTHORACIC ECHOCARDIOGRAM  10/2017; 01/2018   a) EF 55-60%.  No RWMA,  Moderate LA dilation.  Mild LA dilation.  Peak PA pressures ~57 mmHg (moderate);;; b) EF 55-60%.  GRII DD.  Suggestion of RV volume overload with moderate RV dilation.  Severe LA and RA dilation..  PA pressure ~67%.   TRANSTHORACIC ECHOCARDIOGRAM  01/2019   Normal LV size and function.  EF 50 to 55%.  Impaired relaxation.  RV appears to have moderately reduced function.  Severely enlarged.  Cannot fully assess RV pressures.  Hypermobile interatrial septum.  Right atrium severely dilated.   TUBAL LIGATION     vaginal cyst removed     several times    Current Medications: Current Meds  Medication Sig   acetaminophen (TYLENOL) 500 MG tablet Take 500-1,000 mg by mouth every 4 (four) hours as needed for moderate pain.   albuterol (VENTOLIN HFA) 108 (90 Base) MCG/ACT inhaler Inhale 1-2 puffs into the lungs every 6 (six) hours as needed for wheezing or shortness of breath.   Alcohol Swabs (DROPSAFE ALCOHOL PREP) 70 % PADS USE TWO TIMES DAILY AS DIRECTED   apixaban (ELIQUIS) 5 MG TABS tablet Take 1 tablet (5 mg total) by mouth 2 (two) times daily.   Ascorbic Acid (VITAMIN C) 1000 MG tablet Take 1,000 mg by mouth in the morning.   aspirin EC 81 MG tablet Take 1 tablet (81 mg total) by mouth daily. ONLY TAKE AFTER STOPPING THE ELIQUIS FOR PROCEDURE.   atorvastatin (LIPITOR) 40 MG tablet Take 1 tablet (40 mg total) by mouth daily.   B-D ULTRAFINE III SHORT PEN 31G X 8 MM MISC USE DAILY WITH VICTOZA AND/OR NOVOLOG.   benzonatate (TESSALON) 100 MG capsule TAKE 1 CAPSULE THREE TIMES DAILY AS NEEDED FOR COUGH   Blood Glucose Monitoring Suppl (TRUE METRIX METER) w/Device   KIT USE AS DIRECTED   carvedilol (COREG) 12.5 MG  tablet TAKE 1 TABLET TWICE DAILY   diclofenac sodium (VOLTAREN) 1 % GEL Apply 2 g topically 4 (four) times daily as needed (pain).   FeFum-FePoly-FA-B Cmp-C-Biot (INTEGRA PLUS) CAPS Take 1 capsule by mouth every morning. (Patient not taking: Reported on 12/26/2022)   ferrous sulfate 324 MG TBEC Take 324 mg by mouth daily with breakfast.   furosemide (LASIX) 20 MG tablet TAKE 4 TABLETS (80 MG) BY MOUTH IN THE MORNING AND 3 TABLETS (60 MG) EVERY EVENING. (Patient taking differently: Take 60-80 mg by mouth See admin instructions. Take 80 mg in the morning and 60 mg in the evening)   Glucagon (GVOKE HYPOPEN 2-PACK) 0.5 MG/0.1ML SOAJ INJECT 0.5 MG (1 PEN) INTO THE SKIN DAILY AS NEEDED.   guaiFENesin-dextromethorphan (ROBITUSSIN DM) 100-10 MG/5ML syrup Take 10 mLs by mouth every 6 (six) hours as needed for cough.   hydrALAZINE (APRESOLINE) 100 MG tablet Take 1 tablet (100 mg total) by mouth 3 (three) times daily.   hydrocortisone cream 1 % Apply 1 application topically daily as needed for itching.    insulin glargine, 1 Unit Dial, (TOUJEO) 300 UNIT/ML Solostar Pen Inject 20 Units into the skin at bedtime.   insulin lispro (HUMALOG KWIKPEN) 100 UNIT/ML KwikPen Inject 0.02-0.04 mLs (2-4 Units total) into the skin 3 (three) times daily as needed (high blood sugar over 150). (Patient taking differently: Inject 0-10 Units into the skin See admin instructions. Sliding scale Inject  (high blood sugar over 150).  150-199=2 units 200-249=4 units 250-299=6 units 300-349=8 units 350 above 10 units Over 400 call physician)   Isopropyl Alcohol (ALCOHOL WIPES) 70 % MISC Apply 1 each topically 2 (two) times daily.   isosorbide mononitrate (IMDUR) 30 MG 24 hr tablet Take 1 tablet (30 mg total) by mouth daily.   lamoTRIgine (LAMICTAL) 150 MG tablet Take 1 tablet (150 mg total) by mouth 2 (two) times daily.   levETIRAcetam (KEPPRA) 500 MG tablet Take 2 tablets every night   loratadine (CLARITIN) 10 MG tablet Take 1  tablet (10 mg total) by mouth daily.   ondansetron (ZOFRAN) 4 MG tablet Take 1 tablet (4 mg total) by mouth every 8 (eight) hours as needed for nausea or vomiting.   oxyCODONE (OXY IR/ROXICODONE) 5 MG immediate release tablet Take 5 mg by mouth every 6 (six) hours as needed (pain.).   OZEMPIC, 1 MG/DOSE, 4 MG/3ML SOPN INJECT 1MG UNDER THE SKIN ONE TIME WEEKLY (Patient taking differently: Inject 1 mg into the skin every Wednesday at 6 PM. INJECT 1MG UNDER THE SKIN ONE TIME WEEKLY)   Pancrelipase, Lip-Prot-Amyl, 25000-79000 units CPEP Take 1-2 capsules by mouth See admin instructions. Take 2 capsules in the morning with breakfast, 1 capsule with lunch, and 2 capsules with supper   potassium chloride SA (KLOR-CON M) 20 MEQ tablet Take 2 tablets (40 mEq total) by mouth daily. With additional 60meq on metolazone days (Patient taking differently: Take 20-40 mEq by mouth See admin instructions. Take 2 tablets (40 meq) in the morning and take 1 tablet (20 meq) by mouth at night)   sennosides-docusate sodium (SENOKOT-S) 8.6-50 MG tablet Take 1-2 tablets by mouth at bedtime as needed for constipation.   sertraline (ZOLOFT) 50 MG tablet TAKE 1 TABLET EVERY DAY (Patient taking differently: Take 50 mg by mouth every evening.)   tiZANidine (ZANAFLEX) 2 MG tablet TAKE 1 TABLET AT BEDTIME AS NEEDED   tolnaftate (TINACTIN) 1 % cream Apply 1   application topically daily as needed (foot fungus).    TRUE METRIX BLOOD GLUCOSE TEST test strip USE AS DIRECTED TO CHECK BLOOD SUGARS 2 TIMES PER DAY   TRUEplus Lancets 33G MISC TEST BLOOD SUGAR TWICE DAILY AS DIRECTED     Allergies:   Other, Penicillins, Sulfa antibiotics, Aspirin, and Codeine   Social History   Socioeconomic History   Marital status: Divorced    Spouse name: Not on file   Number of children: Not on file   Years of education: Not on file   Highest education level: Not on file  Occupational History   Occupation: Disabled    Comment: CNA  Tobacco Use    Smoking status: Former    Packs/day: 0.50    Years: 25.00    Additional pack years: 0.00    Total pack years: 12.50    Types: Cigarettes   Smokeless tobacco: Never   Tobacco comments:    quit smoking 20+ytrs ago  Vaping Use   Vaping Use: Never used  Substance and Sexual Activity   Alcohol use: No   Drug use: No   Sexual activity: Not Currently    Birth control/protection: Surgical  Other Topics Concern   Not on file  Social History Narrative   She lives in [place]. She has lots of family in the area and is accompanied by her sister.   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   She has a distant history of smoking, quit 20 years ago.   Right handed    Social Determinants of Health   Financial Resource Strain: Low Risk  (07/18/2022)   Overall Financial Resource Strain (CARDIA)    Difficulty of Paying Living Expenses: Not hard at all  Food Insecurity: No Food Insecurity (07/18/2022)   Hunger Vital Sign    Worried About Running Out of Food in the Last Year: Never true    Ran Out of Food in the Last Year: Never true  Transportation Needs: Unmet Transportation Needs (11/28/2022)   PRAPARE - Transportation    Lack of Transportation (Medical): Yes    Lack of Transportation (Non-Medical): Yes  Physical Activity: Inactive (07/18/2022)   Exercise Vital Sign    Days of Exercise per Week: 0 days    Minutes of Exercise per Session: 0 min  Stress: No Stress Concern Present (07/18/2022)   [employer] - Occupational Stress Questionnaire    Feeling of Stress : Not at all  Social Connections: Not on file     Family History: The patient's family history includes Allergies in her father; Cancer in an other family member; Diabetes in her brother, mother, sister, and son; Heart disease in her father, mother, and sister; Heart failure in her father; Hyperlipidemia in her father, mother, and sister; Hypertension in her brother,  father, mother, sister, sister, and son.  ROS:   Please see the history of present illness.    All other systems reviewed and are negative.  EKGs/Labs/Other Studies Reviewed:    The following studies were reviewed today:  Cardiac Studies & Procedures   CARDIAC CATHETERIZATION  CARDIAC CATHETERIZATION 09/07/2022  Narrative 1. Normal filling pressures 2. Moderate pulmonary hypertension in setting of ASD 3. Qp/Qs 1.5/1  Suspect hemodynamically significant ASD.   CARDIAC CATHETERIZATION  CARDIAC CATHETERIZATION 11/09/2019  Narrative 1. Successful deployment of Cardiomems pressure sensor. 2. Normal RA pressure and PCWP. 3. Moderate pulmonary hypertension, PVR not significantly elevated.  Think PH is due to high   cardiac output. 4. Despite high calculated cardiac output, no evidence for significant intracardiac shunt lesion.   STRESS TESTS  MYOCARDIAL PERFUSION IMAGING 01/29/2018  Narrative  The left ventricular ejection fraction is normal (55-65%).  Nuclear stress EF: 58%.  There was no ST segment deviation noted during stress.  The study is normal.  This is a low risk study.  Normal stress nuclear study with no ischemia or infarction.  Gated ejection fraction 58% with normal wall motion.  Note significant right ventricular enlargement.  Suggest echocardiogram to further assess.   ECHOCARDIOGRAM  ECHOCARDIOGRAM COMPLETE 11/15/2020  Narrative ECHOCARDIOGRAM REPORT    Patient Name:   Valery Y Cureton Date of Exam: 11/15/2020 Medical Rec #:  [id number]       Height:       60.0 in Accession #:    [id number]      Weight:       198.2 lb Date of Birth:  06/03/1946      BSA:          1.859 m Patient Age:    74 years        BP:           122/79 mmHg Patient Gender: F               HR:           77 bpm. Exam Location:  Inpatient  Procedure: 2D Echo, Cardiac Doppler and Color Doppler  Indications:    Dyspnea R06.00  History:        Patient has prior history of  Echocardiogram examinations, most recent 03/21/2020. CHF, Previous Myocardial Infarction, COPD; Risk Factors:Hypertension, Diabetes, Sleep Apnea and Dyslipidemia. COVID-19. Seizures. CKD.  Sonographer:    Tiffany Dance Referring Phys: [id number] OMAIR LATIF SHEIKH  IMPRESSIONS   1. Left ventricular ejection fraction, by estimation, is 60 to 65%. The left ventricle has normal function. The left ventricle has no regional wall motion abnormalities. There is mild left ventricular hypertrophy. Left ventricular diastolic parameters are consistent with Grade I diastolic dysfunction (impaired relaxation). 2. Right ventricular systolic function is normal. The right ventricular size is normal. There is normal pulmonary artery systolic pressure. 3. Left atrial size was severely dilated. 4. The mitral valve is normal in structure. No evidence of mitral valve regurgitation. No evidence of mitral stenosis. 5. The aortic valve is normal in structure. There is mild calcification of the aortic valve. There is mild thickening of the aortic valve. Aortic valve regurgitation is not visualized. Mild aortic valve sclerosis is present, with no evidence of aortic valve stenosis. 6. The inferior vena cava is normal in size with greater than 50% respiratory variability, suggesting right atrial pressure of 3 mmHg.  FINDINGS Left Ventricle: Left ventricular ejection fraction, by estimation, is 60 to 65%. The left ventricle has normal function. The left ventricle has no regional wall motion abnormalities. The left ventricular internal cavity size was normal in size. There is mild left ventricular hypertrophy. Left ventricular diastolic parameters are consistent with Grade I diastolic dysfunction (impaired relaxation).  Right Ventricle: The right ventricular size is normal. No increase in right ventricular wall thickness. Right ventricular systolic function is normal. There is normal pulmonary artery systolic pressure. The  tricuspid regurgitant velocity is 2.36 m/s, and with an assumed right atrial pressure of 8 mmHg, the estimated right ventricular systolic pressure is 30.3 mmHg.  Left Atrium: Left atrial size was severely dilated.  Right Atrium: Right atrial size was normal in size.  Pericardium:   There is no evidence of pericardial effusion.  Mitral Valve: The mitral valve is normal in structure. No evidence of mitral valve regurgitation. No evidence of mitral valve stenosis.  Tricuspid Valve: The tricuspid valve is normal in structure. Tricuspid valve regurgitation is not demonstrated. No evidence of tricuspid stenosis.  Aortic Valve: The aortic valve is normal in structure. There is mild calcification of the aortic valve. There is mild thickening of the aortic valve. Aortic valve regurgitation is not visualized. Mild aortic valve sclerosis is present, with no evidence of aortic valve stenosis.  Pulmonic Valve: The pulmonic valve was normal in structure. Pulmonic valve regurgitation is trivial. No evidence of pulmonic stenosis.  Aorta: The aortic root is normal in size and structure.  Venous: The inferior vena cava is normal in size with greater than 50% respiratory variability, suggesting right atrial pressure of 3 mmHg.  IAS/Shunts: No atrial level shunt detected by color flow Doppler.   LEFT VENTRICLE PLAX 2D LVIDd:         4.60 cm LVIDs:         2.90 cm LV PW:         1.20 cm LV IVS:        1.10 cm LVOT diam:     2.10 cm LV SV:         70 LV SV Index:   38 LVOT Area:     3.46 cm   RIGHT VENTRICLE          IVC RV Basal diam:  3.80 cm  IVC diam: 2.70 cm RV Mid diam:    2.60 cm TAPSE (M-mode): 1.9 cm  LEFT ATRIUM              Index       RIGHT ATRIUM           Index LA diam:        5.40 cm  2.90 cm/m  RA Area:     19.70 cm LA Vol (A2C):   94.2 ml  50.66 ml/m RA Volume:   56.00 ml  30.12 ml/m LA Vol (A4C):   109.0 ml 58.62 ml/m LA Biplane Vol: 103.0 ml 55.39 ml/m AORTIC  VALVE LVOT Vmax:   108.05 cm/s LVOT Vmean:  69.300 cm/s LVOT VTI:    0.202 m  AORTA Ao Root diam: 2.70 cm Ao Asc diam:  3.00 cm  MITRAL VALVE               TRICUSPID VALVE MV Area (PHT): 2.99 cm    TR Peak grad:   22.3 mmHg MV Decel Time: 254 msec    TR Vmax:        236.00 cm/s MV E velocity: 94.65 cm/s MV A velocity: 80.90 cm/s  SHUNTS MV E/A ratio:  1.17        Systemic VTI:  0.20 m Systemic Diam: 2.10 cm  Mark Skains MD Electronically signed by Mark Skains MD Signature Date/Time: 11/15/2020/1:15:09 PM    Final   TEE  ECHO TEE 08/16/2022  Narrative TRANSESOPHOGEAL ECHO REPORT    Patient Name:   Katrianna Y Phillippi Date of Exam: 08/15/2022 Medical Rec #:  [id number]       Height:       61.0 in Accession #:    [id number]      Weight:       187.0 lb Date of Birth:  09/05/1945      BSA:          1.836   m Patient Age:    76 years        BP:           120/68 mmHg Patient Gender: F               HR:           76 bpm. Exam Location:  Inpatient  Procedure: Transesophageal Echo, 3D Echo, Color Doppler and Cardiac Doppler  Indications:     I48.91* Unspecified atrial fibrillation  History:         Patient has prior history of Echocardiogram examinations, most recent 11/15/2020. CHF; Risk Factors:Hypertension, Diabetes, Dyslipidemia and Sleep Apnea.  Sonographer:     Emily Senior RDCS Referring Phys:  [id number] DALTON S MCLEAN Diagnosing Phys: Dalton McleanMD  PROCEDURE: After discussion of the risks and benefits of a TEE, an informed consent was obtained from the patient. The transesophogeal probe was passed without difficulty through the esophogus of the patient. Sedation performed by different physician. The patient was monitored while under deep sedation. Anesthestetic sedation was provided intravenously by Anesthesiology: 151mg of Propofol, 60mg of Lidocaine. The patient developed no complications during the procedure. A successful direct current cardioversion was performed at  200 joules with 1 attempt.  IMPRESSIONS   1. Left ventricular ejection fraction, by estimation, is 60 to 65%. The left ventricle has normal function. The left ventricle has no regional wall motion abnormalities. There is mild concentric left ventricular hypertrophy. 2. Right ventricular systolic function is normal. The right ventricular size is mildly enlarged. 3. Peak RV-RA gradient 31 mmHg. 4. Measurements done for Watchman. Left atrial size was severely dilated. No left atrial/left atrial appendage thrombus was detected. 5. Right atrial size was mildly dilated. 6. There is a small 1.9 x 1.2 cm relatively high secundum ASD with left to right flow. The pulmonary veins appear to drain normally to the left atrium. 7. The mitral valve is normal in structure. Trivial mitral valve regurgitation. No evidence of mitral stenosis. 8. The aortic valve is tricuspid. Aortic valve regurgitation is not visualized. No aortic stenosis is present.  FINDINGS Left Ventricle: Left ventricular ejection fraction, by estimation, is 60 to 65%. The left ventricle has normal function. The left ventricle has no regional wall motion abnormalities. The left ventricular internal cavity size was normal in size. There is mild concentric left ventricular hypertrophy.  Right Ventricle: The right ventricular size is mildly enlarged. No increase in right ventricular wall thickness. Right ventricular systolic function is normal.  Left Atrium: Measurements done for Watchman. Left atrial size was severely dilated. No left atrial/left atrial appendage thrombus was detected.  Right Atrium: Right atrial size was mildly dilated.  Pericardium: There is no evidence of pericardial effusion.  Mitral Valve: The mitral valve is normal in structure. Trivial mitral valve regurgitation. No evidence of mitral valve stenosis.  Tricuspid Valve: Peak RV-RA gradient 31 mmHg. The tricuspid valve is normal in structure. Tricuspid valve  regurgitation is mild.  Aortic Valve: The aortic valve is tricuspid. Aortic valve regurgitation is not visualized. No aortic stenosis is present.  Pulmonic Valve: The pulmonic valve was normal in structure. Pulmonic valve regurgitation is not visualized.  Aorta: The aortic root is normal in size and structure.  IAS/Shunts: There is a small 1.9 x 1.2 cm relatively high secundum ASD with left to right flow. The pulmonary veins appear to drain normally to the left atrium.  Additional Comments: Spectral Doppler performed.  Dalton McleanMD Electronically signed by Dalton McleanMD Signature Date/Time: 08/16/2022/3:56:59   PM    Final   MONITORS  LONG TERM MONITOR (3-14 DAYS) 12/02/2019   CT SCANS  CT CORONARY MORPH W/CTA COR W/SCORE 05/15/2018  Addendum 05/15/2018  9:57 PM ADDENDUM REPORT: 05/15/2018 21:54  CLINICAL DATA:  72F with hypertension and diabetes admitted with chest pain.  EXAM: Cardiac/Coronary  CT  TECHNIQUE: The patient was scanned on a Phillips Force scanner.  FINDINGS: A 120 kV prospective scan was triggered in the descending thoracic aorta at 111 HU's. Axial non-contrast 3 mm slices were carried out through the heart. The data set was analyzed on a dedicated work station and scored using the Agatson method. Gantry rotation speed was 250 msecs and collimation was .6 mm. No beta blockade and 0.8 mg of sl NTG was given. The 3D data set was reconstructed in 5% intervals of the 67-82 % of the R-R cycle. Diastolic phases were analyzed on a dedicated work station using MPR, MIP and VRT modes. The patient received 80 cc of contrast.  Aorta: Normal size. Ascending aorta 2.9 cm. Mild calcification of the aortic root. No dissection.  Aortic Valve:  Trileaflet.  No calcifications.  Coronary Arteries:  Normal coronary origin.  Right dominance.  RCA is a small, non-dominant artery. There is minimal (1-24%) non-obstructing mixed plaque in the proximal RCA.  Left  main is a large artery that gives rise to LAD and LCX arteries.  LAD is a very large vessel that wraps the apex and gives rise to the PDA. The LAD has minimal (1-24%) mixed calcified and soft attenuation plaque proximally and mild (25-49%) soft plaque in the mid LAD, worse at the level of D1. There is moderate (25-49%) mixed plaque in the apical LAD.  LCX is a non-dominant artery that gives rise to two OM branches. There is minimal (1-24%) mixed plaque proximally.  Other findings:  Normal pulmonary vein drainage into the left atrium.  Normal let atrial appendage without a thrombus.  Pulmonary artery is enlarged consistent with elevated pulmonary pressures.  IMPRESSION: 1.  Study quality is limited by body habitus.  2. Coronary calcium score of 71.9. This was 77th percentile for age and sex matched control.  2.  Normal coronary origin with hyperdominant LAD.  3. There is mild plaque in the mid LAD and moderate plaque in the distal LAD.  4.  Will send study for FFRct.  5.  Recommend aggressive risk factor modification.  6. Dilation of the pulmonary artery consistent with elevated pulmonary pressures.  Tiffany [place], MD   Electronically Signed By: Tiffany  [place] On: 05/15/2018 21:54  Narrative EXAM: OVER-READ INTERPRETATION  CT CHEST  The following report is an over-read performed by radiologist Dr. Stewart Edmunds of [facility] on 05/15/2018. This over-read does not include interpretation of cardiac or coronary anatomy or pathology. The coronary CTA interpretation by the cardiologist is attached.  COMPARISON:  Chest radiograph 05/15/2018  FINDINGS: Limited view of the lung parenchyma demonstrates mild basilar atelectasis. Airways are normal.  Limited view of the mediastinum demonstrates no adenopathy. Esophagus normal.  Limited view of the upper abdomen unremarkable.  Limited view of the skeleton and chest wall is  unremarkable. Degenerative osteophytosis of the spine.  IMPRESSION: 1. Mild basilar atelectasis. 2. Degenerate spurring of the spine  Electronically Signed: By: Stewart  Edmunds M.D. On: 05/15/2018 19:14          EKG:  EKG is  ordered today.  The ekg ordered today demonstrates atrial flutter with PVCs and incomplete RBBB, HR 76bpm.     Recent Labs: 05/17/2022: Magnesium 2.1 09/28/2022: B Natriuretic Peptide 132.4 10/22/2022: ALT 16 12/26/2022: BUN 13; Creatinine, Ser 0.87; Hemoglobin 9.9; Platelets 290; Potassium 3.3; Sodium 140   Recent Lipid Panel    Component Value Date/Time   CHOL 145 05/04/2021 1014   CHOL 145 10/22/2018 1446   TRIG 16 05/04/2021 1014   HDL 79 05/04/2021 1014   HDL 70 10/22/2018 1446   CHOLHDL 1.8 05/04/2021 1014   VLDL 3 05/04/2021 1014   LDLCALC 63 05/04/2021 1014   LDLCALC 65 10/22/2018 1446   LDLDIRECT 66.0 01/27/2015 1104   Physical Exam:    VS:  BP (!) 142/46   Pulse 76   Ht 5' 1" (1.549 m)   Wt 173 lb 6.4 oz (78.7 kg)   SpO2 97%   BMI 32.76 kg/m     Wt Readings from Last 3 Encounters:  12/26/22 173 lb 6.4 oz (78.7 kg)  12/17/22 182 lb (82.6 kg)  11/28/22 183 lb 3.2 oz (83.1 kg)    General: Well developed, well nourished, NAD Neck: Negative for carotid bruits. No JVD Lungs:Clear to ausculation bilaterally. No wheezes, rales, or rhonchi. Breathing is unlabored. Cardiovascular: Irregularly irregular. No murmurs Extremities: No edema.  Neuro: Alert and oriented. No focal deficits. No facial asymmetry. MAE spontaneously. Psych: Responds to questions appropriately with normal affect.    ASSESSMENT/PLAN:    ASD: Scheduled for ASD closure with Dr. Cooper 7/10. EKG today shows return to atrial flutter with rate control. Will discuss plan with Dr. Cooper in regards to proceeding with procedure. Obtain CBC, BMET today. Instruction letter reviewed with understanding. Will follow up with recommendations.   Atrial flutter: Underwent TEE/DCCV  06/2022 to NSR and appears that she has remained in sinus since that time. Plan is for ASD closure 7/10 however will discuss with Dr. Cooper given the need to hold Eliqius prior to procedure.   Chronic diastolic CHF: Followed by Dr. McLean. Has Cardiomems in place with most recent reading suggestive of fluid volume overload, 25. She took additional Lasix x 3 days. AHF will continue to remotely monitor.   Pulmonary venous hypertension: Followed closely by AHF. V/Q scan 5/22 with no PE noted.   Hx of GI bleeding: Will monitor very closely with the addition of ASA post ASD closure.   HTN: Stable today with no changes needed  Medication Adjustments/Labs and Tests Ordered: Current medicines are reviewed at length with the patient today.  Concerns regarding medicines are outlined above.  Orders Placed This Encounter  Procedures   CBC   Basic metabolic panel   EKG 12-Lead   Meds ordered this encounter  Medications   aspirin EC 81 MG tablet    Sig: Take 1 tablet (81 mg total) by mouth daily. ONLY TAKE AFTER STOPPING THE ELIQUIS FOR PROCEDURE.    Dispense:  90 tablet    Refill:  3    Patient Instructions  Medication Instructions:  Your physician has recommended you make the following change in your medication:  WHEN YOU STOP THE ELIQUIS, START THE ASPIRIN 81 MG  *If you need a refill on your cardiac medications before your next appointment, please call your pharmacy*   Lab Work: TODAY: BMET, CBC If you have labs (blood work) drawn today and your tests are completely normal, you will receive your results only by: MyChart Message (if you have MyChart) OR A paper copy in the mail If you have any lab test that is abnormal or we need to change your treatment, we   will call you to review the results.   Testing/Procedures: SEE INSTRUCTION LETTER   Follow-Up: At [facility], you and your health needs are our priority.  As part of our continuing mission to provide you with  exceptional heart care, we have created designated Provider Care Teams.  These Care Teams include your primary Cardiologist (physician) and Advanced Practice Providers (APPs -  Physician Assistants and Nurse Practitioners) who all work together to provide you with the care you need, when you need it.  We recommend signing up for the patient portal called "MyChart".  Sign up information is provided on this After Visit Summary.  MyChart is used to connect with patients for Virtual Visits (Telemedicine).  Patients are able to view lab/test results, encounter notes, upcoming appointments, etc.  Non-urgent messages can be sent to your provider as well.   To learn more about what you can do with MyChart, go to [email or web address].    Your next appointment:   POST PROCEDURAL FOLLOW-UP    Signed, Bron Snellings, NP  12/28/2022 12:31 PM    [facility] 

## 2022-12-26 ENCOUNTER — Ambulatory Visit: Payer: Medicare HMO | Attending: Cardiology | Admitting: Cardiology

## 2022-12-26 VITALS — BP 142/46 | HR 76 | Ht 61.0 in | Wt 173.4 lb

## 2022-12-26 DIAGNOSIS — I5032 Chronic diastolic (congestive) heart failure: Secondary | ICD-10-CM

## 2022-12-26 DIAGNOSIS — I480 Paroxysmal atrial fibrillation: Secondary | ICD-10-CM | POA: Diagnosis not present

## 2022-12-26 DIAGNOSIS — I484 Atypical atrial flutter: Secondary | ICD-10-CM

## 2022-12-26 DIAGNOSIS — Q211 Atrial septal defect, unspecified: Secondary | ICD-10-CM

## 2022-12-26 DIAGNOSIS — I272 Pulmonary hypertension, unspecified: Secondary | ICD-10-CM | POA: Diagnosis not present

## 2022-12-26 LAB — CBC
MCHC: 30.6 g/dL — ABNORMAL LOW (ref 31.5–35.7)
MCV: 71 fL — ABNORMAL LOW (ref 79–97)
Platelets: 290 10*3/uL (ref 150–450)
RBC: 4.55 x10E6/uL (ref 3.77–5.28)
WBC: 4.6 10*3/uL (ref 3.4–10.8)

## 2022-12-26 LAB — BASIC METABOLIC PANEL

## 2022-12-26 MED ORDER — ASPIRIN 81 MG PO TBEC: 81.0000 mg | DELAYED_RELEASE_TABLET | Freq: Every day | ORAL | 3 refills | Status: DC

## 2022-12-26 NOTE — Patient Instructions (Signed)
Medication Instructions:  Your physician has recommended you make the following change in your medication:  WHEN YOU STOP THE ELIQUIS, START THE ASPIRIN 81 MG  *If you need a refill on your cardiac medications before your next appointment, please call your pharmacy*   Lab Work: TODAY: BMET, CBC If you have labs (blood work) drawn today and your tests are completely normal, you will receive your results only by: MyChart Message (if you have MyChart) OR A paper copy in the mail If you have any lab test that is abnormal or we need to change your treatment, we will call you to review the results.   Testing/Procedures: SEE INSTRUCTION LETTER   Follow-Up: At [facility], you and your health needs are our priority.  As part of our continuing mission to provide you with exceptional heart care, we have created designated Provider Care Teams.  These Care Teams include your primary Cardiologist (physician) and Advanced Practice Providers (APPs -  Physician Assistants and Nurse Practitioners) who all work together to provide you with the care you need, when you need it.  We recommend signing up for the patient portal called "MyChart".  Sign up information is provided on this After Visit Summary.  MyChart is used to connect with patients for Virtual Visits (Telemedicine).  Patients are able to view lab/test results, encounter notes, upcoming appointments, etc.  Non-urgent messages can be sent to your provider as well.   To learn more about what you can do with MyChart, go to [email or web address].    Your next appointment:   POST PROCEDURAL FOLLOW-UP

## 2022-12-27 LAB — BASIC METABOLIC PANEL
BUN/Creatinine Ratio: 15 (ref 12–28)
BUN: 13 mg/dL (ref 8–27)
CO2: 25 mmol/L (ref 20–29)
Calcium: 9.4 mg/dL (ref 8.7–10.3)
Glucose: 112 mg/dL — ABNORMAL HIGH (ref 70–99)
Potassium: 3.3 mmol/L — ABNORMAL LOW (ref 3.5–5.2)
Sodium: 140 mmol/L (ref 134–144)
eGFR: 70 mL/min/{1.73_m2} (ref >59)

## 2022-12-27 LAB — CBC
Hematocrit: 32.4 % — ABNORMAL LOW (ref 34.0–46.6)
Hemoglobin: 9.9 g/dL — ABNORMAL LOW (ref 11.1–15.9)
MCH: 21.8 pg — ABNORMAL LOW (ref 26.6–33.0)
RDW: 19.8 % — ABNORMAL HIGH (ref 11.7–15.4)

## 2022-12-28 ENCOUNTER — Telehealth: Payer: Self-pay

## 2022-12-28 NOTE — Telephone Encounter (Signed)
-----   Message from Filbert Schilder, NP sent at 12/28/2022  8:55 AM EDT ----- Please let the patient know that her Hb looks stable at 9.9 which appears to be at a good baseline for her right now. Her K+ is low at 3.3. Please have her take an additional for three days. This will be a total of daily (she normally takes daily) for three days then she can resume the daily thereafter. Make sure to eat foods rich in potassium like citrus fruits, bananas, and potatoes.

## 2022-12-28 NOTE — Telephone Encounter (Signed)
Called to discuss lab results, no answer. Left detailed message per DPR asking patient to call our office to discuss results.

## 2022-12-31 ENCOUNTER — Telehealth: Payer: Self-pay | Admitting: *Deleted

## 2022-12-31 ENCOUNTER — Ambulatory Visit (INDEPENDENT_AMBULATORY_CARE_PROVIDER_SITE_OTHER): Payer: Medicare HMO | Admitting: Internal Medicine

## 2022-12-31 ENCOUNTER — Encounter: Payer: Self-pay | Admitting: Internal Medicine

## 2022-12-31 VITALS — BP 108/60 | HR 81 | Temp 97.8°F | Ht 61.0 in | Wt 175.2 lb

## 2022-12-31 DIAGNOSIS — Q211 Atrial septal defect, unspecified: Secondary | ICD-10-CM

## 2022-12-31 DIAGNOSIS — J449 Chronic obstructive pulmonary disease, unspecified: Secondary | ICD-10-CM

## 2022-12-31 DIAGNOSIS — Z794 Long term (current) use of insulin: Secondary | ICD-10-CM | POA: Diagnosis not present

## 2022-12-31 DIAGNOSIS — I130 Hypertensive heart and chronic kidney disease with heart failure and stage 1 through stage 4 chronic kidney disease, or unspecified chronic kidney disease: Secondary | ICD-10-CM | POA: Diagnosis not present

## 2022-12-31 DIAGNOSIS — N182 Chronic kidney disease, stage 2 (mild): Secondary | ICD-10-CM | POA: Diagnosis not present

## 2022-12-31 DIAGNOSIS — Z6833 Body mass index (BMI) 33.0-33.9, adult: Secondary | ICD-10-CM | POA: Diagnosis not present

## 2022-12-31 DIAGNOSIS — E6609 Other obesity due to excess calories: Secondary | ICD-10-CM | POA: Diagnosis not present

## 2022-12-31 DIAGNOSIS — E1122 Type 2 diabetes mellitus with diabetic chronic kidney disease: Secondary | ICD-10-CM

## 2022-12-31 DIAGNOSIS — M1A0410 Idiopathic chronic gout, right hand, without tophus (tophi): Secondary | ICD-10-CM

## 2022-12-31 DIAGNOSIS — I5032 Chronic diastolic (congestive) heart failure: Secondary | ICD-10-CM | POA: Diagnosis not present

## 2022-12-31 MED ORDER — INSULIN LISPRO (1 UNIT DIAL) 100 UNIT/ML (KWIKPEN)
0.0000 [IU] | PEN_INJECTOR | SUBCUTANEOUS | 0 refills | Status: DC
Start: 2022-12-31 — End: 2023-02-21

## 2022-12-31 NOTE — Progress Notes (Signed)
I,[name], CMA,acting as a Neurosurgeon for [name], MD.,have documented all relevant documentation on the behalf of [name], MD,as directed by  [name], MD while in the presence of [name], MD.  Subjective:  Patient ID: [name] , female    DOB: 14-Jul-1945 , 77 y.o.   MRN: [id number]  Chief Complaint  Patient presents with   Diabetes   Hypertension    HPI  She is here today for a diabetes and BP check. She reports compliance with meds. Patient presents with her sister [name]. She denies having any specific questions or concerns. She states her sugars have been elevated, they have been running in the 150-160's. She admits she has been using expired Humalog. She would like a refill. She also admits to dietary indiscretions.   She adds that she is scheduled for ATRIAL SEPTAL DEFECT(ASD) CLOSURE next Wednesday, performed by Dr Murrell Converse. She admits being a little nervous.        Diabetes She presents for her follow-up diabetic visit. She has type 2 diabetes mellitus. Her disease course has been improving. There are no hypoglycemic associated symptoms. Pertinent negatives for diabetes include no blurred vision, no chest pain, no polydipsia, no polyphagia and no polyuria. There are no hypoglycemic complications. Diabetic complications include nephropathy. Risk factors for coronary artery disease include diabetes mellitus, dyslipidemia, hypertension, obesity, post-menopausal and sedentary lifestyle. She is compliant with treatment some of the time. Her weight is fluctuating minimally. She is following a generally healthy diet. She never participates in exercise. Her home blood glucose trend is decreasing steadily. Her breakfast blood glucose is taken between 8-9 am. Her breakfast blood glucose range is generally 130-140 mg/dl. An ACE inhibitor/angiotensin II receptor blocker is being taken. Eye exam is not current.  Hypertension This is a chronic  problem. The current episode started more than 1 year ago. The problem has been gradually improving since onset. The problem is controlled. Pertinent negatives include no blurred vision, chest pain or palpitations. The current treatment provides moderate improvement. Hypertensive end-organ damage includes kidney disease. Identifiable causes of hypertension include sleep apnea.     Past Medical History:  Diagnosis Date   Abnormal liver function     in the past.   Anemia    Arthritis    all over   Bruises easily    Cataract    right eye;immature   CHF (congestive heart failure) (HCC)    Chronic back pain    stenosis   Chronic cough    Chronic kidney disease    COPD (chronic obstructive pulmonary disease) (HCC)    COVID    aug/sept 2021   [facility]and myocardial infarction [facility]) 2012   [place] Infarction in setting of Pancreatitis --> mild Troponin elevation, NON-OBSTRUCTIVE CAD   Depression    takes Abilify daily as well as Zoloft   Diastolic heart failure 2010   Grade 1 diastolic Dysfunction by Echo    Diverticulosis    DM (diabetes mellitus) (HCC)    takes Victoza daily as well as Lantus and Humalog   Empty sella (HCC)    on MRI in 2009.   Glaucoma    Headache(784.0)    last migraine-4-87yrs ago   History of blood transfusion    no abnormal reaction noted   History of colon polyps    benign   HTN (hypertension)    takes Benicar,Imdur,and Bystolic daily   Hyperlipidemia    takes Lipitor daily  Joint swelling    Nocturia    Obesity hypoventilation syndrome (HCC)    Obstructive sleep apnea 02/2018   Notably improved split-night study with weight loss from 270 (2017) down to 250 pounds (2019).   Pancreatitis    takes Pancrelipase daily   Parkinson's disease    takes Sinemet daily   Peripheral neuropathy    Pneumonia 2012   Seizures (HCC)    takes Lamictal daily and Primidone nightly;last seizure 2wks ago   Urinary urgency    With increased frequency   Varicose veins of  both lower extremities with pain    With edema.  Takes daily Lasix     Family History  Problem Relation Age of Onset   Allergies Father    Heart disease Father        before 67   Heart failure Father    Hypertension Father    Hyperlipidemia Father    Heart disease Mother    Diabetes Mother    Hypertension Mother    Hyperlipidemia Mother    Hypertension Sister    Heart disease Sister        before 40   Hyperlipidemia Sister    Diabetes Brother    Hypertension Brother    Diabetes Sister    Hypertension Sister    Diabetes Son    Hypertension Son    Cancer Other      Current Outpatient Medications:    acetaminophen (TYLENOL) 500 MG tablet, Take 500-1,000 mg by mouth every 4 (four) hours as needed for moderate pain., Disp: , Rfl:    albuterol (VENTOLIN HFA) 108 (90 Base) MCG/ACT inhaler, Inhale 1-2 puffs into the lungs every 6 (six) hours as needed for wheezing or shortness of breath., Disp: 18 g, Rfl: 5   Alcaftadine (LASTACAFT) 0.25 % SOLN, Place 1-2 drops into both eyes daily as needed (allergy eye/irritation.)., Disp: , Rfl:    Alcohol Swabs (DROPSAFE ALCOHOL PREP) 70 % PADS, USE TWO TIMES DAILY AS DIRECTED, Disp: 200 each, Rfl: 3   apixaban (ELIQUIS) 5 MG TABS tablet, Take 1 tablet (5 mg total) by mouth 2 (two) times daily., Disp: 180 tablet, Rfl: 1   Ascorbic Acid (VITAMIN C) 1000 MG tablet, Take 1,000 mg by mouth in the morning., Disp: , Rfl:    atorvastatin (LIPITOR) 40 MG tablet, Take 1 tablet (40 mg total) by mouth daily., Disp: 90 tablet, Rfl: 3   B-D ULTRAFINE III SHORT PEN 31G X 8 MM MISC, USE DAILY WITH VICTOZA AND/OR NOVOLOG., Disp: 400 each, Rfl: 2   benzonatate (TESSALON) 100 MG capsule, TAKE 1 CAPSULE THREE TIMES DAILY AS NEEDED FOR COUGH, Disp: 30 capsule, Rfl: 1   Blood Glucose Monitoring Suppl (TRUE METRIX METER) w/Device KIT, USE AS DIRECTED, Disp: 1 kit, Rfl: 1   carvedilol (COREG) 12.5 MG tablet, TAKE 1 TABLET TWICE DAILY, Disp: 180 tablet, Rfl: 3    dexlansoprazole (DEXILANT) 60 MG capsule, Take 60 mg by mouth daily., Disp: , Rfl:    diclofenac sodium (VOLTAREN) 1 % GEL, Apply 2 g topically 4 (four) times daily as needed (pain)., Disp: , Rfl:    ferrous sulfate 324 MG TBEC, Take 324 mg by mouth daily with breakfast., Disp: , Rfl:    furosemide (LASIX) 20 MG tablet, TAKE 4 TABLETS (80 MG) BY MOUTH IN THE MORNING AND 3 TABLETS (60 MG) EVERY EVENING. (Patient taking differently: Take 60-80 mg by mouth See admin instructions. Take 80 mg in the morning and 60 mg in  the evening), Disp: 630 tablet, Rfl: 3   Glucagon (GVOKE HYPOPEN 2-PACK) 0.5 MG/0.1ML SOAJ, INJECT 0.5 MG (1 PEN) INTO THE SKIN DAILY AS NEEDED., Disp: 2 mL, Rfl: 11   guaiFENesin-dextromethorphan (ROBITUSSIN DM) 100-10 MG/5ML syrup, Take 10 mLs by mouth every 6 (six) hours as needed for cough., Disp: 118 mL, Rfl: 0   hydrALAZINE (APRESOLINE) 100 MG tablet, Take 1 tablet (100 mg total) by mouth 3 (three) times daily., Disp: 270 tablet, Rfl: 3   hydrocortisone cream 1 %, Apply 1 application topically daily as needed for itching. , Disp: , Rfl:    insulin glargine, 1 Unit Dial, (TOUJEO) 300 UNIT/ML Solostar Pen, Inject 20 Units into the skin at bedtime., Disp: , Rfl:    Isopropyl Alcohol (ALCOHOL WIPES) 70 % MISC, Apply 1 each topically 2 (two) times daily., Disp: 300 each, Rfl: 3   isosorbide mononitrate (IMDUR) 30 MG 24 hr tablet, Take 1 tablet (30 mg total) by mouth daily., Disp: 90 tablet, Rfl: 3   lamoTRIgine (LAMICTAL) 150 MG tablet, Take 1 tablet (150 mg total) by mouth 2 (two) times daily., Disp: 180 tablet, Rfl: 3   levETIRAcetam (KEPPRA) 500 MG tablet, Take 2 tablets every night, Disp: 180 tablet, Rfl: 3   loratadine (CLARITIN) 10 MG tablet, Take 1 tablet (10 mg total) by mouth daily., Disp: 30 tablet, Rfl: 2   oxyCODONE (OXY IR/ROXICODONE) 5 MG immediate release tablet, Take 5 mg by mouth every 6 (six) hours as needed (pain.)., Disp: , Rfl:    OZEMPIC, 1 MG/DOSE, 4 MG/3ML SOPN,  INJECT 1MG  UNDER THE SKIN ONE TIME WEEKLY (Patient taking differently: Inject 1 mg into the skin every Wednesday at 6 PM. INJECT 1MG  UNDER THE SKIN ONE TIME WEEKLY), Disp: 9 mL, Rfl: 3   Pancrelipase, Lip-Prot-Amyl, 25000-79000 units CPEP, Take 1-2 capsules by mouth See admin instructions. Take 2 capsules in the morning with breakfast, 1 capsule with lunch, and 2 capsules with supper, Disp: , Rfl:    potassium chloride SA (KLOR-CON M) 20 MEQ tablet, Take 2 tablets (40 mEq total) by mouth daily. With additional on metolazone days (Patient taking differently: Take 20-40 mEq by mouth See admin instructions. Take 2 tablets (40 meq) in the morning and take 1 tablet (20 meq) by mouth at night), Disp: 300 tablet, Rfl: 3   sennosides-docusate sodium (SENOKOT-S) 8.6-50 MG tablet, Take 1-2 tablets by mouth at bedtime as needed for constipation., Disp: , Rfl:    sertraline (ZOLOFT) 50 MG tablet, TAKE 1 TABLET EVERY DAY (Patient taking differently: Take 50 mg by mouth every evening.), Disp: 90 tablet, Rfl: 3   tiZANidine (ZANAFLEX) 2 MG tablet, TAKE 1 TABLET AT BEDTIME AS NEEDED, Disp: 30 tablet, Rfl: 3   tolnaftate (TINACTIN) 1 % cream, Apply 1 application topically daily as needed (foot fungus). , Disp: , Rfl:    TRUE METRIX BLOOD GLUCOSE TEST test strip, USE AS DIRECTED TO CHECK BLOOD SUGARS 2 TIMES PER DAY, Disp: 200 strip, Rfl: 10   TRUEplus Lancets 33G MISC, TEST BLOOD SUGAR TWICE DAILY AS DIRECTED, Disp: 200 each, Rfl: 3   amiodarone (PACERONE) 200 MG tablet, Take 1 tablet (200 mg total) by mouth 2 (two) times daily for 14 days, THEN 1 tablet (200 mg total) daily., Disp: 90 tablet, Rfl: 3   FeFum-FePoly-FA-B Cmp-C-Biot (INTEGRA PLUS) CAPS, Take 1 capsule by mouth every morning. (Patient not taking: Reported on 01/11/2023), Disp: 30 capsule, Rfl: 2   insulin lispro (HUMALOG KWIKPEN) 100 UNIT/ML KwikPen,  Inject 0-10 Units into the skin See admin instructions. Sliding scale:Inject  (high blood sugar over  150). 150-199=2 units;200-249=4 units,250-299=6 units;300-349=8 units;350 above 10 units: Over 350 call physician, Disp: 15 mL, Rfl: 0 No current facility-administered medications for this visit.  Facility-Administered Medications Ordered in Other Visits:    0.9 %  sodium chloride infusion (Manually program via Guardrails IV Fluids), , Intravenous, Once, [place], Jessica M, FNP   0.9 %  sodium chloride infusion (Manually program via Guardrails IV Fluids), , Intravenous, Once, [place], Jessica M, FNP   Allergies  Allergen Reactions   Other Anaphylaxis and Rash    Bleach   Penicillins Hives    Did it involve swelling of the face/tongue/throat, SOB, or low BP? No Did it involve sudden or severe rash/hives, skin peeling, or any reaction on the inside of your mouth or nose? Yes Did you need to seek medical attention at a hospital or doctor's office? Yes When did it last happen?      Childhood allergy  If all above answers are "NO", may proceed with cephalosporin use.     Sulfa Antibiotics Hives   Aspirin Other (See Comments)     On aspirin 81 mg - Rectal bleeding in dec 2018   Codeine     Headache and makes the patient feel "off"     Review of Systems  Constitutional: Negative.   Eyes:  Negative for blurred vision.  Respiratory: Negative.    Cardiovascular: Negative.  Negative for chest pain and palpitations.  Gastrointestinal: Negative.   Endocrine: Negative for polydipsia, polyphagia and polyuria.  Musculoskeletal: Negative.   Neurological: Negative.   Psychiatric/Behavioral: Negative.       Today's Vitals   12/31/22 1531  BP: 108/60  Pulse: 81  Temp: 97.8 F (36.6 C)  SpO2: 98%  Weight: 175 lb 3.2 oz (79.5 kg)  Height: 5\' 1"  (1.549 m)   Body mass index is 33.1 kg/m.  Wt Readings from Last 3 Encounters:  01/11/23 178 lb (80.7 kg)  01/02/23 174 lb (78.9 kg)  12/31/22 175 lb 3.2 oz (79.5 kg)     Objective:  Physical Exam Vitals and nursing note reviewed.   Constitutional:      Appearance: Normal appearance. She is obese.  HENT:     Head: Normocephalic and atraumatic.  Eyes:     Extraocular Movements: Extraocular movements intact.  Cardiovascular:     Rate and Rhythm: Normal rate. Rhythm irregular.     Heart sounds: Murmur heard.  Pulmonary:     Effort: Pulmonary effort is normal.     Breath sounds: Normal breath sounds.  Musculoskeletal:     Cervical back: Normal range of motion.     Comments: Ambulatory with cane  Skin:    General: Skin is warm.  Neurological:     General: No focal deficit present.     Mental Status: She is alert.  Psychiatric:        Mood and Affect: Mood normal.        Behavior: Behavior normal.         Assessment And Plan:  Type 2 diabetes mellitus with stage 2 chronic kidney disease, with long-term current use of insulin (HCC) -     Insulin Lispro (1 Unit Dial); Inject 0-10 Units into the skin See admin instructions. Sliding scale:Inject  (high blood sugar over 150). 150-199=2 units;200-249=4 units,250-299=6 units;300-349=8 units;350 above 10 units: Over 350 call physician  Dispense: 15 mL; Refill: 0 -  Hemoglobin A1c -     Lipid panel  Hypertensive heart and renal disease with renal failure, stage 1 through stage 4 or unspecified chronic kidney disease, with heart failure (HCC) Assessment & Plan: Chronic, well controlled. She will continue with current meds including carvedilol, furosemide and hydralazine. Importance of dietary compliance was discussed with the patient.   Orders: -     Lipid panel  Chronic diastolic CHF (congestive heart failure), NYHA class 2 (HCC) Assessment & Plan: Chronic, encouraged to follow low sodium diet. Most recent Cardiology notes reviewed, sx are stable.   ASD (atrial septal defect) Assessment & Plan: Chronic, small secundum ASD with left to right flow seen on TEE. Right heart cath (3/24) showed hemodynamically significant ASD.  cheduled for closure on  7/10.   Chronic gout of right hand, unspecified cause Assessment & Plan: I will check uric acid level today. She is encouraged to stay well hydrated and avoid known triggers including  shellfish, beef, etc.   Orders: -     Uric acid  Class 1 obesity due to excess calories with serious comorbidity and body mass index (BMI) of 33.0 to 33.9 in adult Assessment & Plan: She is encouraged to strive for BMI less than 30 to decrease cardiac risk. Advised to aim for at least 150 minutes of exercise per week.    She is encouraged to strive for BMI less than 30 to decrease cardiac risk. Advised to aim for at least 150 minutes of exercise per week.    Return in about 4 months (around 05/03/2023), or DM CHECK.  Patient was given opportunity to ask questions. Patient verbalized understanding of the plan and was able to repeat key elements of the plan. All questions were answered to their satisfaction.   I, [name], MD, have reviewed all documentation for this visit. The documentation on 01/17/23 for the exam, diagnosis, procedures, and orders are all accurate and complete.   IF YOU HAVE BEEN REFERRED TO A SPECIALIST, IT MAY TAKE 1-2 WEEKS TO SCHEDULE/PROCESS THE REFERRAL. IF YOU HAVE NOT HEARD FROM US/SPECIALIST IN TWO WEEKS, PLEASE GIVE [place] A CALL AT [phone].   THE PATIENT IS ENCOURAGED TO PRACTICE SOCIAL DISTANCING DUE TO THE COVID-19 PANDEMIC.

## 2022-12-31 NOTE — Telephone Encounter (Signed)
ASD Closure scheduled at [facility] for: Wednesday January 02, 2023 2:30 PM Arrival time [facility] Main Entrance A at: 12:00 PM-needs BMP  Nothing to eat after midnight prior to procedure, clear liquids until 5 AM day of procedure.  Medication instructions -Hold:  Eliquis-PM prior/ AM of procedure  Ozempic-1 week prior to procedure-pt reports she did not take 12/26/22 and will hold until post procedure  Lasix-AM of procedure  Insulin-AM of procedure/1/2 usual dose HS prior to procedure -Other usual morning medications can be taken with sips of water including aspirin 81 mg.  Confirmed patient has responsible adult to drive home post procedure and be with patient first 24 hours after arriving home.  Plan to go home the same day, you will only stay overnight if medically necessary.  See 12/26/22 BMP results for instructions about extra potassium supplement. Patient reports she has not gotten message about increasing potassium supplement. Patient reports she is currently taking 2 tablets (40 mEq)  in the morning and 1 tablet (20 mEq) at night. Patient instructed to increase KCl to 2 tablets ( 40 mEq) two times a day-this will be an increase of 20 mEq for 2 days, pt will take 2 tablets (40 mEq) before going to hospital morning of procedure.  Reviewed procedure instructions with patient.

## 2022-12-31 NOTE — Patient Instructions (Signed)
Type 2 Diabetes Mellitus, Diagnosis, Adult Type 2 diabetes (type 2 diabetes mellitus) is a long-term (chronic) disease. It may happen when there is one or both of these problems: The pancreas does not make enough insulin. The body does not react in a normal way to insulin that it makes. Insulin lets sugars go into cells in your body. If you have type 2 diabetes, sugars cannot get into your cells. Sugars build up in the blood. This causes high blood sugar. What are the causes? The exact cause of this condition is not known. What increases the risk? Having type 2 diabetes in your family. Being overweight or very overweight. Not being active. Your body not reacting in a normal way to the insulin it makes. Having higher than normal blood sugar over time. Having a type of diabetes when you were pregnant. Having a condition that causes small fluid-filled sacs on your ovaries. What are the signs or symptoms? At first, you may have no symptoms. You will get symptoms slowly. They may include: More thirst than normal. More hunger than normal. Needing to pee more than normal. Losing weight without trying. Feeling tired. Feeling weak. Seeing things blurry. Dark patches on your skin. How is this treated? This condition may be treated by a diabetes expert. You may need to: Follow an eating plan made by a food expert (dietitian). Get regular exercise. Find ways to deal with stress. Check blood sugar as often as told. Take medicines. Your doctor will set treatment goals for you. Your blood sugar should be at these levels: Before meals: 80-130 mg/dL (4.4-7.2 mmol/L). After meals: below 180 mg/dL (10 mmol/L). Over the last 2-3 months: less than 7%. Follow these instructions at home: Medicines Take your diabetes medicines or insulin every day. Take medicines as told to help you prevent other problems caused by this condition. You may need: Aspirin. Medicine to lower cholesterol. Medicine to  control blood pressure. Questions to ask your doctor Should I meet with a diabetes educator? What medicines do I need, and when should I take them? What will I need to treat my condition at home? When should I check my blood sugar? Where can I find a support group? Who can I call if I have questions? When is my next doctor visit? General instructions Take over-the-counter and prescription medicines only as told by your doctor. Keep all follow-up visits. Where to find more information For help and guidance and more information about diabetes, please go to: American Diabetes Association (ADA): www.diabetes.org American Association of Diabetes Care and Education Specialists (ADCES): www.diabeteseducator.org International Diabetes Federation (IDF): www.idf.org Contact a doctor if: Your blood sugar is at or above 240 mg/dL (13.3 mmol/L) for 2 days in a row. You have been sick for 2 days or more, and you are not getting better. You have had a fever for 2 days or more, and you are not getting better. You have any of these problems for more than 6 hours: You cannot eat or drink. You feel like you may vomit. You vomit. You have watery poop (diarrhea). Get help right away if: Your blood sugar is lower than 54 mg/dL (3 mmol/L). You feel mixed up (confused). You have trouble thinking clearly. You have trouble breathing. You have medium or large ketone levels in your pee. These symptoms may be an emergency. Get help right away. Call your local emergency services (911 in the [place] Do not wait to see if the symptoms will go away. Do not drive yourself   to the hospital. Summary Type 2 diabetes is a long-term disease. Your pancreas may not make enough insulin, or your body may not react in a normal way to insulin that it makes. This condition is treated with an eating plan, lifestyle changes, and medicines. Your doctor will set treatment goals for you. These will help you keep your blood sugar  in a healthy range. Keep all follow-up visits. This information is not intended to replace advice given to you by your health care provider. Make sure you discuss any questions you have with your health care provider. Document Revised: 09/05/2020 Document Reviewed: 09/05/2020 Elsevier Patient Education  2024 Elsevier Inc.  

## 2023-01-01 LAB — URIC ACID: Uric Acid: 8.2 mg/dL — ABNORMAL HIGH (ref 3.1–7.9)

## 2023-01-01 LAB — LIPID PANEL
Chol/HDL Ratio: 1.9 ratio (ref 0.0–4.4)
Cholesterol, Total: 156 mg/dL (ref 100–199)
HDL: 84 mg/dL (ref >39)
LDL Chol Calc (NIH): 52 mg/dL (ref 0–99)
Triglycerides: 114 mg/dL (ref 0–149)
VLDL Cholesterol Cal: 20 mg/dL (ref 5–40)

## 2023-01-01 LAB — HEMOGLOBIN A1C
Est. average glucose Bld gHb Est-mCnc: 163 mg/dL
Hgb A1c MFr Bld: 7.3 % — ABNORMAL HIGH (ref 4.8–5.6)

## 2023-01-02 ENCOUNTER — Ambulatory Visit (HOSPITAL_COMMUNITY)
Admission: RE | Disposition: A | Discharge status: Home / Self Care | Payer: Self-pay | Source: Ambulatory Visit | Attending: Cardiovascular Disease

## 2023-01-02 ENCOUNTER — Ambulatory Visit (HOSPITAL_COMMUNITY)
Admission: RE | Admit: 2023-01-02 | Discharge: 2023-01-03 | Disposition: A | Discharge status: Home / Self Care | Payer: Medicare HMO | Source: Ambulatory Visit | Attending: Cardiovascular Disease | Admitting: Cardiovascular Disease

## 2023-01-02 ENCOUNTER — Ambulatory Visit (HOSPITAL_BASED_OUTPATIENT_CLINIC_OR_DEPARTMENT_OTHER): Payer: Medicare HMO

## 2023-01-02 ENCOUNTER — Other Ambulatory Visit: Payer: Self-pay

## 2023-01-02 DIAGNOSIS — Q211 Atrial septal defect, unspecified: Secondary | ICD-10-CM

## 2023-01-02 DIAGNOSIS — E662 Morbid (severe) obesity with alveolar hypoventilation: Secondary | ICD-10-CM | POA: Diagnosis present

## 2023-01-02 DIAGNOSIS — I071 Rheumatic tricuspid insufficiency: Secondary | ICD-10-CM | POA: Diagnosis not present

## 2023-01-02 DIAGNOSIS — I4892 Unspecified atrial flutter: Secondary | ICD-10-CM | POA: Diagnosis not present

## 2023-01-02 DIAGNOSIS — Z6832 Body mass index (BMI) 32.0-32.9, adult: Secondary | ICD-10-CM | POA: Diagnosis not present

## 2023-01-02 DIAGNOSIS — I5032 Chronic diastolic (congestive) heart failure: Secondary | ICD-10-CM | POA: Diagnosis not present

## 2023-01-02 DIAGNOSIS — G40909 Epilepsy, unspecified, not intractable, without status epilepticus: Secondary | ICD-10-CM | POA: Diagnosis not present

## 2023-01-02 DIAGNOSIS — Q2111 Secundum atrial septal defect: Secondary | ICD-10-CM

## 2023-01-02 DIAGNOSIS — Z8774 Personal history of (corrected) congenital malformations of heart and circulatory system: Secondary | ICD-10-CM

## 2023-01-02 DIAGNOSIS — I110 Hypertensive heart disease with heart failure: Secondary | ICD-10-CM | POA: Diagnosis present

## 2023-01-02 DIAGNOSIS — Z8249 Family history of ischemic heart disease and other diseases of the circulatory system: Secondary | ICD-10-CM | POA: Insufficient documentation

## 2023-01-02 DIAGNOSIS — Z7901 Long term (current) use of anticoagulants: Secondary | ICD-10-CM | POA: Diagnosis not present

## 2023-01-02 DIAGNOSIS — Z794 Long term (current) use of insulin: Secondary | ICD-10-CM | POA: Insufficient documentation

## 2023-01-02 DIAGNOSIS — Z8673 Personal history of transient ischemic attack (TIA), and cerebral infarction without residual deficits: Secondary | ICD-10-CM | POA: Diagnosis not present

## 2023-01-02 DIAGNOSIS — Z01812 Encounter for preprocedural laboratory examination: Secondary | ICD-10-CM

## 2023-01-02 DIAGNOSIS — I272 Pulmonary hypertension, unspecified: Secondary | ICD-10-CM | POA: Diagnosis present

## 2023-01-02 DIAGNOSIS — E1122 Type 2 diabetes mellitus with diabetic chronic kidney disease: Secondary | ICD-10-CM | POA: Diagnosis not present

## 2023-01-02 DIAGNOSIS — N189 Chronic kidney disease, unspecified: Secondary | ICD-10-CM | POA: Insufficient documentation

## 2023-01-02 DIAGNOSIS — G4733 Obstructive sleep apnea (adult) (pediatric): Secondary | ICD-10-CM | POA: Insufficient documentation

## 2023-01-02 DIAGNOSIS — Z87891 Personal history of nicotine dependence: Secondary | ICD-10-CM | POA: Diagnosis not present

## 2023-01-02 DIAGNOSIS — I130 Hypertensive heart and chronic kidney disease with heart failure and stage 1 through stage 4 chronic kidney disease, or unspecified chronic kidney disease: Secondary | ICD-10-CM | POA: Diagnosis not present

## 2023-01-02 DIAGNOSIS — Z5982 Transportation insecurity: Secondary | ICD-10-CM | POA: Diagnosis not present

## 2023-01-02 HISTORY — PX: ATRIAL SEPTAL DEFECT(ASD) CLOSURE: CATH118299

## 2023-01-02 LAB — BASIC METABOLIC PANEL
Anion gap: 10 (ref 5–15)
BUN: 14 mg/dL (ref 8–23)
CO2: 26 mmol/L (ref 22–32)
Calcium: 8.7 mg/dL — ABNORMAL LOW (ref 8.9–10.3)
Chloride: 103 mmol/L (ref 98–111)
Creatinine, Ser: 0.9 mg/dL (ref 0.44–1.00)
GFR, Estimated: >60 mL/min (ref >60)
Glucose, Bld: 124 mg/dL — ABNORMAL HIGH (ref 70–99)
Potassium: 3.8 mmol/L (ref 3.5–5.1)
Sodium: 139 mmol/L (ref 135–145)

## 2023-01-02 LAB — GLUCOSE, CAPILLARY
Glucose-Capillary: 115 mg/dL — ABNORMAL HIGH (ref 70–99)
Glucose-Capillary: 89 mg/dL (ref 70–99)
Glucose-Capillary: 135 mg/dL — ABNORMAL HIGH (ref 70–99)

## 2023-01-02 LAB — ECHOCARDIOGRAM LIMITED
Height: 61 in
Weight: 2784 oz

## 2023-01-02 LAB — POCT ACTIVATED CLOTTING TIME
Activated Clotting Time: 201 seconds
Activated Clotting Time: 214 seconds
Activated Clotting Time: 244 seconds

## 2023-01-02 SURGERY — ATRIAL SEPTAL DEFECT (ASD) CLOSURE
Anesthesia: LOCAL

## 2023-01-02 MED ORDER — HEPARIN SODIUM (PORCINE) 1000 UNIT/ML IJ SOLN
INTRAMUSCULAR | Status: AC
  Filled 2023-01-02: qty 10

## 2023-01-02 MED ORDER — CEFAZOLIN SODIUM-DEXTROSE 2-3 GM-%(50ML) IV SOLR
INTRAVENOUS | Status: DC | PRN
  Administered 2023-01-02: 2 g via INTRAVENOUS

## 2023-01-02 MED ORDER — ALBUTEROL SULFATE HFA 108 (90 BASE) MCG/ACT IN AERS: 1.0000 | INHALATION_SPRAY | Freq: Four times a day (QID) | RESPIRATORY_TRACT | Status: DC | PRN

## 2023-01-02 MED ORDER — SODIUM CHLORIDE 0.9% FLUSH
3.0000 mL | Freq: Two times a day (BID) | INTRAVENOUS | Status: DC
  Administered 2023-01-02: 3 mL via INTRAVENOUS

## 2023-01-02 MED ORDER — HYDRALAZINE HCL 20 MG/ML IJ SOLN: 10.0000 mg | INTRAMUSCULAR | Status: AC | PRN

## 2023-01-02 MED ORDER — FUROSEMIDE 40 MG PO TABS
60.0000 mg | ORAL_TABLET | Freq: Two times a day (BID) | ORAL | Status: DC
  Administered 2023-01-02 – 2023-01-03 (×2): 60 mg via ORAL
  Filled 2023-01-02 (×3): qty 1

## 2023-01-02 MED ORDER — SODIUM CHLORIDE 0.9 % IV SOLN: 250.0000 mL | INTRAVENOUS | Status: DC | PRN

## 2023-01-02 MED ORDER — LORATADINE 10 MG PO TABS
10.0000 mg | ORAL_TABLET | Freq: Every day | ORAL | Status: DC
  Administered 2023-01-03: 10 mg via ORAL
  Filled 2023-01-02: qty 1

## 2023-01-02 MED ORDER — DIPHENHYDRAMINE HCL 50 MG/ML IJ SOLN
INTRAMUSCULAR | Status: AC
  Filled 2023-01-02: qty 1

## 2023-01-02 MED ORDER — ACETAMINOPHEN 325 MG PO TABS: 650.0000 mg | ORAL_TABLET | ORAL | Status: DC | PRN

## 2023-01-02 MED ORDER — PANCRELIPASE (LIP-PROT-AMYL) 12000-38000 UNITS PO CPEP
24000.0000 [IU] | ORAL_CAPSULE | Freq: Two times a day (BID) | ORAL | Status: DC
  Administered 2023-01-02 – 2023-01-03 (×2): 24000 [IU] via ORAL
  Filled 2023-01-02 (×2): qty 2

## 2023-01-02 MED ORDER — INSULIN GLARGINE-YFGN 100 UNIT/ML ~~LOC~~ SOLN
20.0000 [IU] | Freq: Every day | SUBCUTANEOUS | Status: DC
  Administered 2023-01-02: 20 [IU] via SUBCUTANEOUS
  Filled 2023-01-02 (×2): qty 0.2

## 2023-01-02 MED ORDER — PANCRELIPASE (LIP-PROT-AMYL) 12000-38000 UNITS PO CPEP: 24000.0000 [IU] | ORAL_CAPSULE | Freq: Every day | ORAL | Status: DC

## 2023-01-02 MED ORDER — DIPHENHYDRAMINE HCL 50 MG/ML IJ SOLN
INTRAMUSCULAR | Status: DC | PRN
  Administered 2023-01-02: 25 mg via INTRAVENOUS

## 2023-01-02 MED ORDER — SERTRALINE HCL 50 MG PO TABS
50.0000 mg | ORAL_TABLET | Freq: Every evening | ORAL | Status: DC
  Administered 2023-01-02: 50 mg via ORAL
  Filled 2023-01-02: qty 1

## 2023-01-02 MED ORDER — INSULIN GLARGINE (1 UNIT DIAL) 300 UNIT/ML ~~LOC~~ SOPN: 20.0000 [IU] | PEN_INJECTOR | Freq: Every day | SUBCUTANEOUS | Status: DC

## 2023-01-02 MED ORDER — LABETALOL HCL 5 MG/ML IV SOLN: 10.0000 mg | INTRAVENOUS | Status: AC | PRN

## 2023-01-02 MED ORDER — APIXABAN 5 MG PO TABS: 5.0000 mg | ORAL_TABLET | Freq: Two times a day (BID) | ORAL | Status: DC

## 2023-01-02 MED ORDER — ONDANSETRON HCL 4 MG PO TABS: 4.0000 mg | ORAL_TABLET | Freq: Three times a day (TID) | ORAL | Status: DC | PRN

## 2023-01-02 MED ORDER — ACETAMINOPHEN 500 MG PO TABS: 500.0000 mg | ORAL_TABLET | ORAL | Status: DC | PRN

## 2023-01-02 MED ORDER — ONDANSETRON HCL 4 MG/2ML IJ SOLN: 4.0000 mg | Freq: Four times a day (QID) | INTRAMUSCULAR | Status: DC | PRN

## 2023-01-02 MED ORDER — OXYCODONE HCL 5 MG PO TABS
5.0000 mg | ORAL_TABLET | Freq: Four times a day (QID) | ORAL | Status: DC | PRN
  Administered 2023-01-02: 5 mg via ORAL
  Filled 2023-01-02: qty 1

## 2023-01-02 MED ORDER — LIDOCAINE HCL (PF) 1 % IJ SOLN
INTRAMUSCULAR | Status: DC | PRN
  Administered 2023-01-02: 20 mL

## 2023-01-02 MED ORDER — SODIUM CHLORIDE 0.9% FLUSH: 3.0000 mL | INTRAVENOUS | Status: DC | PRN

## 2023-01-02 MED ORDER — SODIUM CHLORIDE 0.9 % WEIGHT BASED INFUSION: 1.0000 mL/kg/h | INTRAVENOUS | Status: DC

## 2023-01-02 MED ORDER — MIDAZOLAM HCL 2 MG/2ML IJ SOLN
INTRAMUSCULAR | Status: AC
  Filled 2023-01-02: qty 2

## 2023-01-02 MED ORDER — CEFAZOLIN SODIUM-DEXTROSE 2-4 GM/100ML-% IV SOLN
INTRAVENOUS | Status: AC
  Filled 2023-01-02: qty 100

## 2023-01-02 MED ORDER — CEFAZOLIN SODIUM-DEXTROSE 2-4 GM/100ML-% IV SOLN
2.0000 g | INTRAVENOUS | Status: DC
  Filled 2023-01-02: qty 100

## 2023-01-02 MED ORDER — ISOSORBIDE MONONITRATE ER 30 MG PO TB24
30.0000 mg | ORAL_TABLET | Freq: Every day | ORAL | Status: DC
  Administered 2023-01-03: 30 mg via ORAL
  Filled 2023-01-02 (×2): qty 1

## 2023-01-02 MED ORDER — HYDRALAZINE HCL 50 MG PO TABS
100.0000 mg | ORAL_TABLET | Freq: Three times a day (TID) | ORAL | Status: DC
  Administered 2023-01-02 – 2023-01-03 (×3): 100 mg via ORAL
  Filled 2023-01-02 (×3): qty 2

## 2023-01-02 MED ORDER — POTASSIUM CHLORIDE CRYS ER 20 MEQ PO TBCR
40.0000 meq | EXTENDED_RELEASE_TABLET | Freq: Every day | ORAL | Status: DC
  Administered 2023-01-03: 40 meq via ORAL
  Filled 2023-01-02: qty 2

## 2023-01-02 MED ORDER — FENTANYL CITRATE (PF) 100 MCG/2ML IJ SOLN
INTRAMUSCULAR | Status: AC
  Filled 2023-01-02: qty 2

## 2023-01-02 MED ORDER — HEPARIN (PORCINE) IN NACL 1000-0.9 UT/500ML-% IV SOLN
INTRAVENOUS | Status: DC | PRN
  Administered 2023-01-02 (×3): 500 mL

## 2023-01-02 MED ORDER — LEVETIRACETAM 500 MG PO TABS
1000.0000 mg | ORAL_TABLET | Freq: Every day | ORAL | Status: DC
  Administered 2023-01-02: 1000 mg via ORAL
  Filled 2023-01-02: qty 2

## 2023-01-02 MED ORDER — SODIUM CHLORIDE 0.9% FLUSH: 3.0000 mL | Freq: Two times a day (BID) | INTRAVENOUS | Status: DC

## 2023-01-02 MED ORDER — SODIUM CHLORIDE 0.9 % WEIGHT BASED INFUSION
3.0000 mL/kg/h | INTRAVENOUS | Status: DC
  Administered 2023-01-02: 3 mL/kg/h via INTRAVENOUS

## 2023-01-02 MED ORDER — LABETALOL HCL 5 MG/ML IV SOLN: 10.0000 mg | INTRAVENOUS | Status: DC | PRN

## 2023-01-02 MED ORDER — LAMOTRIGINE 100 MG PO TABS
150.0000 mg | ORAL_TABLET | Freq: Two times a day (BID) | ORAL | Status: DC
  Administered 2023-01-02 – 2023-01-03 (×2): 150 mg via ORAL
  Filled 2023-01-02 (×2): qty 2

## 2023-01-02 MED ORDER — LIDOCAINE HCL (PF) 1 % IJ SOLN
INTRAMUSCULAR | Status: AC
  Filled 2023-01-02: qty 30

## 2023-01-02 MED ORDER — MIDAZOLAM HCL 2 MG/2ML IJ SOLN
INTRAMUSCULAR | Status: DC | PRN
  Administered 2023-01-02: 1 mg via INTRAVENOUS

## 2023-01-02 MED ORDER — PANTOPRAZOLE SODIUM 40 MG PO TBEC
40.0000 mg | DELAYED_RELEASE_TABLET | Freq: Every day | ORAL | Status: DC
  Administered 2023-01-02 – 2023-01-03 (×2): 40 mg via ORAL
  Filled 2023-01-02 (×2): qty 1

## 2023-01-02 MED ORDER — ASPIRIN 81 MG PO CHEW: 81.0000 mg | CHEWABLE_TABLET | ORAL | Status: DC

## 2023-01-02 MED ORDER — APIXABAN 5 MG PO TABS
5.0000 mg | ORAL_TABLET | Freq: Two times a day (BID) | ORAL | Status: DC
  Administered 2023-01-02 – 2023-01-03 (×2): 5 mg via ORAL
  Filled 2023-01-02 (×2): qty 1

## 2023-01-02 MED ORDER — FENTANYL CITRATE (PF) 100 MCG/2ML IJ SOLN
INTRAMUSCULAR | Status: DC | PRN
  Administered 2023-01-02: 25 ug via INTRAVENOUS

## 2023-01-02 MED ORDER — HEPARIN SODIUM (PORCINE) 1000 UNIT/ML IJ SOLN
INTRAMUSCULAR | Status: DC | PRN
  Administered 2023-01-02: 3000 [IU] via INTRAVENOUS
  Administered 2023-01-02: 6000 [IU] via INTRAVENOUS
  Administered 2023-01-02: 4000 [IU] via INTRAVENOUS

## 2023-01-02 MED ORDER — ATORVASTATIN CALCIUM 40 MG PO TABS
40.0000 mg | ORAL_TABLET | Freq: Every day | ORAL | Status: DC
  Administered 2023-01-03: 40 mg via ORAL
  Filled 2023-01-02: qty 1

## 2023-01-02 MED ORDER — SENNOSIDES-DOCUSATE SODIUM 8.6-50 MG PO TABS: 1.0000 | ORAL_TABLET | Freq: Every evening | ORAL | Status: DC | PRN

## 2023-01-02 MED ORDER — ASPIRIN 81 MG PO CHEW: 81.0000 mg | CHEWABLE_TABLET | Freq: Every day | ORAL | Status: DC

## 2023-01-02 MED ORDER — CLOPIDOGREL BISULFATE 75 MG PO TABS
300.0000 mg | ORAL_TABLET | ORAL | Status: AC
  Administered 2023-01-02: 300 mg via ORAL
  Filled 2023-01-02: qty 4

## 2023-01-02 MED ORDER — SODIUM CHLORIDE 0.9 % IV SOLN: INTRAVENOUS | Status: AC

## 2023-01-02 MED ORDER — PANCRELIPASE (LIP-PROT-AMYL) 25000-79000 UNITS PO CPEP: 1.0000 | ORAL_CAPSULE | ORAL | Status: DC

## 2023-01-02 MED ORDER — CARVEDILOL 12.5 MG PO TABS
12.5000 mg | ORAL_TABLET | Freq: Two times a day (BID) | ORAL | Status: DC
  Administered 2023-01-02 – 2023-01-03 (×2): 12.5 mg via ORAL
  Filled 2023-01-02 (×2): qty 1

## 2023-01-02 SURGICAL SUPPLY — 22 items
BALLN SIZING AMPLATZER 24 (BALLOONS) ×1
BALLOON SIZING AMPLATZER 24 (BALLOONS) IMPLANT
BALN SZ 70X4.5 7FR 3 LUM (BALLOONS) ×1
CATH ACUNAV 8FR 90CM (CATHETERS) IMPLANT
CATH SUPER TORQUE PLUS 6F MPA1 (CATHETERS) IMPLANT
CLOSURE PERCLOSE PROSTYLE (VASCULAR PRODUCTS) IMPLANT
GUIDEWIRE AMPLATZER 1.5JX260 (WIRE) IMPLANT
GUIDEWIRE SAFE TJ AMPLATZ EXST (WIRE) IMPLANT
KIT HEART LEFT (KITS) ×1 IMPLANT
KIT MICROPUNCTURE NIT STIFF (SHEATH) IMPLANT
OCCLUDER AMPLATZER SEPTAL 16MM (Prosthesis & Implant Heart) IMPLANT
PACK CARDIAC CATHETERIZATION (CUSTOM PROCEDURE TRAY) ×1 IMPLANT
PROTECTION STATION PRESSURIZED (MISCELLANEOUS) ×1
SHEATH INTROD W/O MIN 9FR 25CM (SHEATH) IMPLANT
SHEATH PINNACLE 11FRX10 (SHEATH) IMPLANT
SHEATH PINNACLE 8F 10CM (SHEATH) IMPLANT
STATION PROTECTION PRESSURIZED (MISCELLANEOUS) IMPLANT
SYS DELIVER AMP TREVISIO 8FR (SHEATH) ×1
SYSTEM DELIVER AMP TREVIS 8FR (SHEATH) IMPLANT
TRANSDUCER W/STOPCOCK (MISCELLANEOUS) ×1 IMPLANT
TUBING CIL FLEX 10 FLL-RA (TUBING) ×1 IMPLANT
WIRE EMERALD 3MM-J .035X150CM (WIRE) IMPLANT

## 2023-01-02 NOTE — Discharge Instructions (Signed)
You will require antibiotics prior to any dental work, including cleanings, for 6 months after your PFO/ASD closure. This is to protect the device from potentially getting infected from bacteria in your mouth entering your bloodstream. The medication will be called into your pharmacy at your 1 month follow up appointment. If you require dental work before that time, please call the office and it will be called into your pharmacy on file. Please pick this up to have ready before any scheduled dental work. Instructions will be outlined on the bottle.   Groin Site Care Refer to this sheet in the next few weeks. These instructions provide you with information on caring for yourself after your procedure. Your caregiver may also give you more specific instructions. Your treatment has been planned according to current medical practices, but problems sometimes occur. Call your caregiver if you have any problems or questions after your procedure. HOME CARE INSTRUCTIONS You may shower 24 hours after the procedure. Remove the bandage (dressing) and gently wash the site with plain soap and water. Gently pat the site dry.  Do not apply powder or lotion to the site.  Do not sit in a bathtub, swimming pool, or whirlpool for 5 to 7 days.  No bending, squatting, or lifting anything over 10 pounds (4.5 kg) for 1 week Inspect the site at least twice daily.  Do not drive home if you are discharged the same day of the procedure. Have someone else drive you.  You may drive 24 hours after the procedure unless otherwise instructed by your caregiver.  What to expect: Any bruising will usually fade within 1 to 2 weeks.  Blood that collects in the tissue (hematoma) may be painful to the touch. It should usually decrease in size and tenderness within 1 to 2 weeks.  SEEK IMMEDIATE MEDICAL CARE IF: You have unusual pain at the groin site or down the affected leg.  You have redness, warmth, swelling, or pain at the groin site.   You have drainage (other than a small amount of blood on the dressing).  You have chills.  You have a fever or persistent symptoms for more than 72 hours.  You have a fever and your symptoms suddenly get worse.  Your leg becomes pale, cool, tingly, or numb.  You have heavy bleeding from the site. Hold pressure on the site.  

## 2023-01-02 NOTE — Discharge Summary (Incomplete)
[facility]   MULTIDISCIPLINARY HEART VALVE TEAM  Discharge Summary    Patient ID: [name] MRN: [id number]; DOB: 1945-08-24  Admit date: 01/02/2023 Discharge date: 01/03/2023  Primary Care Provider: [name], MD  Primary Cardiologist: [name] Lemma, MD / Dr. Shirlee [name] (AHF)/ Dr. Excell Seltzer, MD (ASD)   Discharge Diagnoses    Principal Problem:   History of percutaneous transcatheter closure of congenital ASD Active Problems:   Obesity hypoventilation syndrome (HCC)   Hypertensive heart disease with chronic diastolic congestive heart failure (HCC)   Morbid obesity with BMI of 45.0-49.9, adult (HCC)   Seizure disorder (HCC)   Chronic diastolic CHF (congestive heart failure), NYHA class 2 (HCC)   Pulmonary hypertension, unspecified (HCC)   Atrial septal defect   ASD (atrial septal defect)  Allergies Allergies  Allergen Reactions   Other Anaphylaxis and Rash    Bleach   Penicillins Hives    Did it involve swelling of the face/tongue/throat, SOB, or low BP? No Did it involve sudden or severe rash/hives, skin peeling, or any reaction on the inside of your mouth or nose? Yes Did you need to seek medical attention at a hospital or doctor's office? Yes When did it last happen?      Childhood allergy  If all above answers are "NO", may proceed with cephalosporin use.     Sulfa Antibiotics Hives   Aspirin Other (See Comments)     On aspirin 81 mg - Rectal bleeding in dec 2018   Codeine     Headache and makes the patient feel "off"   Diagnostic Studies/Procedures    ASD closure 01/02/23: Successful transcatheter ASD closure using a 16 mm Amplatzer septal occluder device with intracardiac echo and fluoroscopic guidance   Recommend: Resume apixaban tonight No antiplatelet therapy due to bleeding risk with chronic iron deficiency anemia Overnight observation and discharge home tomorrow morning after limited 2D echocardiogram _____________    Echo 01/02/23:   IMPRESSIONS  1. Left ventricular ejection fraction, by estimation, is 55 to 60%. The  left ventricle has normal function.   2. Right ventricular systolic function is moderately reduced. The right  ventricular size is moderately enlarged.   3. Left atrial size was severely dilated.   4. There is an atrial septal closure device in place. No residual  shunting seen.   5. Right atrial size was severely dilated.   6. Limited study to evaluate ASD closure.   History of Present Illness     [name] is a 77 y.o. female with a history of pulmonary HTN, OSA, hx of GI bleeding with chronic anemia, hx of CVA, seizure d/o, atrial flutter on Eliquis, chronic diastolic CHF followed by AHF team, and ASD who presented to [facility] for planned ASD closure.     [name] is followed closely by Dr. Shirlee [name] for her cardiology care. She was recently diagnosed (07/2022) with atrial flutter and underwent a TEE/DCCV. At that time she was found to have an atrial septal defect with continuous left-to-right flow. She underwent cardioversion at that time and remains on apixaban. She then underwent right heart catheterization to evaluate for hemodynamic significance of her atrial septal defect. This demonstrated a mean RA pressure of 6, pulmonary pressure 57/12 with a mean of 31, and a pulmonary capillary wedge pressure of 13. There was a significant step up from the SVC to the PA with oxygen saturations of 64% and 77%, respectively. The patient's QP/QS is 1.5/1.    She was  seen in structural heart consultation by Dr. Excell [name] 10/08/22 and was found to be a good candidate for ASD closure if her Hb could remain >8mg /dL however she wished to discuss proceeding with her family prior to scheduling. In follow up with Dr. Excell [name] 5/23 she wanted to proceed with scheduling.    In pre-procedure evaluation she was found to be back in atrial flutter on EKG with rate control.   Hospital Course     ASD: s/p successful ASD closure with  16mm Amplatzer septal occluder device without complication. She was kept overnight for observation. Groin site remains stable with no evidence of hematoma or bleeding. Post op limited echo with EF 55%, moderate RV enlargement/dysfunction. She was restarted on Eliquis monotherapy post procedure. Aspirin discontinued. Post procedure instructions reviewed with understanding. She will require dental SBE for 6 months which will be provided at follow up. She should avoid dental cleanings for a month post closure. Will see her back in follow up with echo 8/14.   Atrial flutter: Underwent TEE/DCCV 06/2022 to [facility] and appears that she remained in sinus since that time. Seen in pre-op and found to be back in atrial flutter with rate control. Eliquis restarted post procedure. If she remains in AF, will need to consider repeat DCCV versus EP evaluation for potential ablation/antiarrythmic if felt necessary. May consider Watchman in the future.   Chronic diastolic CHF: Followed by Dr. Shirlee [name]. Has Cardiomems in place with most recent reading suggestive of fluid volume overload, 25. She took additional Lasix x 3 days. AHF will continue to remotely monitor. Appears euvolemic today.    Pulmonary venous hypertension: Followed closely by AHF. V/Q scan 5/22 with no PE noted.    Hx of GI bleeding: Will monitor very closely with the addition of ASA post ASD closure.    HTN: Stable today with no changes needed  Consultants: None    The patient has been seen and examined by Dr. Excell [name] who feels that she is stable and ready for discharge today, 01/03/23.  _____________  Discharge Vitals Blood pressure 125/66, pulse 68, temperature 97.9 F (36.6 C), temperature source Oral, resp. rate 20, height 5\' 1"  (1.549 m), weight 78.9 kg, SpO2 96%.  Filed Weights   01/02/23 1249 01/02/23 1319  Weight: 78.9 kg 78.9 kg     GEN: Well nourished, well developed, in no acute distress HEENT: normal Neck: no JVD or masses Cardiac:  RRR; no murmurs, rubs, or gallops,no edema  Respiratory:  clear to auscultation bilaterally, normal work of breathing GI: soft, nontender, nondistended, + BS MS: no deformity or atrophy Skin: warm and dry, no rash. Groin site stable.  Neuro:  Alert and Oriented x 3, Strength and sensation are intact Psych: euthymic mood, full affect   Labs & Radiologic Studies    CBC No results for input(s): "WBC", "NEUTROABS", "HGB", "HCT", "MCV", "PLT" in the last 72 hours. Basic Metabolic Panel Recent Labs    16/10/96 1312  NA 139  K 3.8  CL 103  CO2 26  GLUCOSE 124*  BUN 14  CREATININE 0.90  CALCIUM 8.7*   Liver Function Tests No results for input(s): "AST", "ALT", "ALKPHOS", "BILITOT", "PROT", "ALBUMIN" in the last 72 hours. No results for input(s): "LIPASE", "AMYLASE" in the last 72 hours. Cardiac Enzymes No results for input(s): "CKTOTAL", "CKMB", "CKMBINDEX", "TROPONINI" in the last 72 hours. BNP Invalid input(s): "POCBNP" D-Dimer No results for input(s): "DDIMER" in the last 72 hours. Hemoglobin A1C Recent Labs    12/31/22 1628  HGBA1C 7.3*   Fasting Lipid Panel Recent Labs    12/31/22 1628  CHOL 156  HDL 84  LDLCALC 52  TRIG 114  CHOLHDL 1.9   Thyroid Function Tests No results for input(s): "TSH", "T4TOTAL", "T3FREE", "THYROIDAB" in the last 72 hours.  Invalid input(s): "FREET3" _____________  ECHOCARDIOGRAM LIMITED  Result Date: 01/02/2023    ECHOCARDIOGRAM LIMITED REPORT   Patient Name:   [name] [facility] Date of Exam: 01/02/2023 Medical Rec #:  [id number]       Height:       61.0 in Accession #:    [id number]      Weight:       174.0 lb Date of Birth:  1945/10/14      BSA:          1.780 m Patient Age:    76 years        BP:           134/98 mmHg Patient Gender: F               HR:           63 bpm. Exam Location:  Inpatient Procedure: Limited Echo and Limited Color Doppler Indications:    Atrial Septal Defect Q21.1  History:        Patient has prior history of  Echocardiogram examinations, most                 recent 12/24/2022. CHF; Risk Factors:Dyslipidemia, Diabetes,                 Hypertension, Former Smoker and Sleep Apnea. History of                 percutaneous transcatheter closure of congenital ASD.  Sonographer:    Dondra Prader RVT RCS Referring Phys: [phone] JILL D MCDANIEL IMPRESSIONS  1. Left ventricular ejection fraction, by estimation, is 55 to 60%. The left ventricle has normal function.  2. Right ventricular systolic function is moderately reduced. The right ventricular size is moderately enlarged.  3. Left atrial size was severely dilated.  4. There is an atrial septal closure device in place. No residual shunting seen.  5. Right atrial size was severely dilated.  6. Limited study to evaluate ASD closure. FINDINGS  Left Ventricle: Left ventricular ejection fraction, by estimation, is 55 to 60%. The left ventricle has normal function. Right Ventricle: The right ventricular size is moderately enlarged. Right ventricular systolic function is moderately reduced. Left Atrium: Left atrial size was severely dilated. Right Atrium: Right atrial size was severely dilated. Mitral Valve: Trivial mitral valve regurgitation. Tricuspid Valve: The tricuspid valve is normal in structure. Tricuspid valve regurgitation is mild. IVC IVC diam: 3.10 cm Arvilla Meres MD Electronically signed by Arvilla Meres MD Signature Date/Time: 01/02/2023/7:09:35 PM    Final    CARDIAC CATHETERIZATION  Result Date: 01/02/2023 Successful transcatheter ASD closure using a 16 mm Amplatzer septal occluder device with intracardiac echo and fluoroscopic guidance Recommend: Resume apixaban tonight No antiplatelet therapy due to bleeding risk with chronic iron deficiency anemia Overnight observation and discharge home tomorrow morning after limited 2D echocardiogram   Disposition   Pt is being discharged home today in good condition.  Follow-up Plans & Appointments     Follow-up  Information     Filbert Schilder, NP Follow up on 02/06/2023.   Specialty: Cardiology Why: You will have an echocardiogram at 9:20am with an office visit to follow. Please arrive at 9:10am. Contact  information: [address] [place] [address] [phone]                  Discharge Medications   Allergies as of 01/03/2023       Reactions   Other Anaphylaxis, Rash   Bleach   Penicillins Hives   Did it involve swelling of the face/tongue/throat, SOB, or low BP? No Did it involve sudden or severe rash/hives, skin peeling, or any reaction on the inside of your mouth or nose? Yes Did you need to seek medical attention at a hospital or doctor's office? Yes When did it last happen?      Childhood allergy  If all above answers are "NO", may proceed with cephalosporin use.   Sulfa Antibiotics Hives   Aspirin Other (See Comments)    On aspirin 81 mg - Rectal bleeding in dec 2018   Codeine    Headache and makes the patient feel "off"        Medication List     STOP taking these medications    aspirin EC 81 MG tablet       TAKE these medications    acetaminophen 500 MG tablet Commonly known as: TYLENOL Take 500-1,000 mg by mouth every 4 (four) hours as needed for moderate pain.   albuterol 108 (90 Base) MCG/ACT inhaler Commonly known as: VENTOLIN HFA Inhale 1-2 puffs into the lungs every 6 (six) hours as needed for wheezing or shortness of breath.   Alcohol Wipes 70 % Misc Apply 1 each topically 2 (two) times daily.   apixaban 5 MG Tabs tablet Commonly known as: ELIQUIS Take 1 tablet (5 mg total) by mouth 2 (two) times daily.   atorvastatin 40 MG tablet Commonly known as: LIPITOR Take 1 tablet (40 mg total) by mouth daily.   B-D ULTRAFINE III SHORT PEN 31G X 8 MM Misc Generic drug: Insulin Pen Needle USE DAILY WITH VICTOZA AND/OR NOVOLOG.   benzonatate 100 MG capsule Commonly known as: TESSALON TAKE 1 CAPSULE THREE TIMES DAILY AS NEEDED  FOR COUGH   carvedilol 12.5 MG tablet Commonly known as: COREG TAKE 1 TABLET TWICE DAILY   Dexilant 60 MG capsule Generic drug: dexlansoprazole Take 60 mg by mouth daily.   diclofenac sodium 1 % Gel Commonly known as: VOLTAREN Apply 2 g topically 4 (four) times daily as needed (pain).   DropSafe Alcohol Prep 70 % Pads USE TWO TIMES DAILY AS DIRECTED   ferrous sulfate 324 MG Tbec Take 324 mg by mouth daily with breakfast.   furosemide 20 MG tablet Commonly known as: LASIX TAKE 4 TABLETS (80 MG) BY MOUTH IN THE MORNING AND 3 TABLETS (60 MG) EVERY EVENING. What changed: See the new instructions.   guaiFENesin-dextromethorphan 100-10 MG/5ML syrup Commonly known as: ROBITUSSIN DM Take 10 mLs by mouth every 6 (six) hours as needed for cough.   Gvoke HypoPen 2-Pack 0.5 MG/0.1ML Soaj Generic drug: Glucagon INJECT 0.5 MG (1 PEN) INTO THE SKIN DAILY AS NEEDED.   hydrALAZINE 100 MG tablet Commonly known as: APRESOLINE Take 1 tablet (100 mg total) by mouth 3 (three) times daily.   hydrocortisone cream 1 % Apply 1 application topically daily as needed for itching.   insulin glargine (1 Unit Dial) 300 UNIT/ML Solostar Pen Commonly known as: TOUJEO Inject 20 Units into the skin at bedtime.   insulin lispro 100 UNIT/ML KwikPen Commonly known as: HumaLOG KwikPen Inject 0-10 Units into the skin See admin instructions. Sliding scale:Inject  (  high blood sugar over 150). 150-199=2 units;200-249=4 units,250-299=6 units;300-349=8 units;350 above 10 units: Over 350 call physician   Integra Plus Caps Take 1 capsule by mouth every morning.   isosorbide mononitrate 30 MG 24 hr tablet Commonly known as: IMDUR Take 1 tablet (30 mg total) by mouth daily.   lamoTRIgine 150 MG tablet Commonly known as: LaMICtal Take 1 tablet (150 mg total) by mouth 2 (two) times daily.   Lastacaft 0.25 % Soln Generic drug: Alcaftadine Place 1-2 drops into both eyes daily as needed (allergy  eye/irritation.).   levETIRAcetam 500 MG tablet Commonly known as: KEPPRA Take 2 tablets every night   loratadine 10 MG tablet Commonly known as: Claritin Take 1 tablet (10 mg total) by mouth daily.   ondansetron 4 MG tablet Commonly known as: ZOFRAN Take 1 tablet (4 mg total) by mouth every 8 (eight) hours as needed for nausea or vomiting.   oxyCODONE 5 MG immediate release tablet Commonly known as: Oxy IR/ROXICODONE Take 5 mg by mouth every 6 (six) hours as needed (pain.).   Ozempic (1 MG/DOSE) 4 MG/3ML Sopn Generic drug: Semaglutide (1 MG/DOSE) INJECT 1MG  UNDER THE SKIN ONE TIME WEEKLY What changed:  how much to take how to take this when to take this   Pancrelipase (Lip-Prot-Amyl) 25000-79000 units Cpep Take 1-2 capsules by mouth See admin instructions. Take 2 capsules in the morning with breakfast, 1 capsule with lunch, and 2 capsules with supper   potassium chloride SA 20 MEQ tablet Commonly known as: KLOR-CON M Take 2 tablets (40 mEq total) by mouth daily. With additional on metolazone days What changed:  how much to take when to take this additional instructions   sennosides-docusate sodium 8.6-50 MG tablet Commonly known as: SENOKOT-S Take 1-2 tablets by mouth at bedtime as needed for constipation.   sertraline 50 MG tablet Commonly known as: ZOLOFT TAKE 1 TABLET EVERY DAY What changed: when to take this   tiZANidine 2 MG tablet Commonly known as: ZANAFLEX TAKE 1 TABLET AT BEDTIME AS NEEDED   tolnaftate 1 % cream Commonly known as: TINACTIN Apply 1 application topically daily as needed (foot fungus).   True Metrix Blood Glucose Test test strip Generic drug: glucose blood USE AS DIRECTED TO CHECK BLOOD SUGARS 2 TIMES PER DAY   True Metrix Meter w/Device Kit USE AS DIRECTED   TRUEplus Lancets 33G Misc TEST BLOOD SUGAR TWICE DAILY AS DIRECTED   vitamin C 1000 MG tablet Take 1,000 mg by mouth in the morning.          Outstanding  Labs/Studies   none  Duration of Discharge Encounter   Greater than 30 minutes including physician time.  Byrd Hesselbach, PA-C 01/03/2023, 10:44 AM [phone]   Patient seen, examined. Available data reviewed. Agree with findings, assessment, and plan as outlined by Carlean Jews, PA-C.  The patient is independently interviewed and examined this morning.  Heart is irregular without murmur or gallop.  Lungs are clear bilaterally, abdomen is soft and nontender with no masses, right groin site is clear with no hematoma or ecchymosis, lower extremities have no edema.  The patient has done well with transcatheter ASD closure using a 16 mm Amplatzer septal occluder device.  She should remain on apixaban alone.  Her postoperative echo shows good device position and no residual shunting.  Patient is medically stable for discharge today.  Otherwise as above.  Tonny Bollman, M.D. 01/03/2023 6:13 PM

## 2023-01-02 NOTE — Progress Notes (Signed)
*  PRELIMINARY RESULTS* Echocardiogram 2D Echocardiogram has been performed.  [name] 01/02/2023, 5:28 PM

## 2023-01-02 NOTE — Interval H&P Note (Signed)
History and Physical Interval Note:  01/02/2023 2:23 PM  [name]  has presented today for surgery, with the diagnosis of asd.  The various methods of treatment have been discussed with the patient and family. After consideration of risks, benefits and other options for treatment, the patient has consented to  Procedure(s): ATRIAL SEPTAL DEFECT(ASD) CLOSURE (N/A) as a surgical intervention.  The patient's history has been reviewed, patient examined, no change in status, stable for surgery.  I have reviewed the patient's chart and labs.  Questions were answered to the patient's satisfaction.     Tonny Bollman

## 2023-01-03 ENCOUNTER — Encounter (HOSPITAL_COMMUNITY): Payer: Self-pay | Admitting: Cardiovascular Disease

## 2023-01-03 ENCOUNTER — Other Ambulatory Visit (HOSPITAL_COMMUNITY): Payer: Medicare HMO

## 2023-01-03 DIAGNOSIS — N189 Chronic kidney disease, unspecified: Secondary | ICD-10-CM | POA: Diagnosis not present

## 2023-01-03 DIAGNOSIS — Q2111 Secundum atrial septal defect: Secondary | ICD-10-CM | POA: Diagnosis not present

## 2023-01-03 DIAGNOSIS — Q211 Atrial septal defect, unspecified: Secondary | ICD-10-CM

## 2023-01-03 DIAGNOSIS — G4733 Obstructive sleep apnea (adult) (pediatric): Secondary | ICD-10-CM | POA: Diagnosis not present

## 2023-01-03 DIAGNOSIS — I071 Rheumatic tricuspid insufficiency: Secondary | ICD-10-CM | POA: Diagnosis not present

## 2023-01-03 DIAGNOSIS — G40909 Epilepsy, unspecified, not intractable, without status epilepticus: Secondary | ICD-10-CM | POA: Diagnosis not present

## 2023-01-03 DIAGNOSIS — E1122 Type 2 diabetes mellitus with diabetic chronic kidney disease: Secondary | ICD-10-CM | POA: Diagnosis not present

## 2023-01-03 DIAGNOSIS — I130 Hypertensive heart and chronic kidney disease with heart failure and stage 1 through stage 4 chronic kidney disease, or unspecified chronic kidney disease: Secondary | ICD-10-CM | POA: Diagnosis not present

## 2023-01-03 DIAGNOSIS — I5032 Chronic diastolic (congestive) heart failure: Secondary | ICD-10-CM | POA: Diagnosis not present

## 2023-01-03 LAB — GLUCOSE, CAPILLARY
Glucose-Capillary: 59 mg/dL — ABNORMAL LOW (ref 70–99)
Glucose-Capillary: 90 mg/dL (ref 70–99)

## 2023-01-03 MED FILL — Cefazolin Sodium-Dextrose IV Solution 2 GM/100ML-4%: INTRAVENOUS | Qty: 100 | Status: AC

## 2023-01-04 ENCOUNTER — Telehealth (HOSPITAL_COMMUNITY): Payer: Self-pay

## 2023-01-04 NOTE — Telephone Encounter (Signed)
Left message for patient to call the office to arrange a post hospital follow up.

## 2023-01-05 LAB — LIPOPROTEIN A (LPA): Lipoprotein (a): 55.6 nmol/L — ABNORMAL HIGH (ref <75.0)

## 2023-01-11 ENCOUNTER — Other Ambulatory Visit (HOSPITAL_COMMUNITY): Payer: Self-pay

## 2023-01-11 ENCOUNTER — Encounter (HOSPITAL_COMMUNITY): Payer: Self-pay

## 2023-01-11 ENCOUNTER — Ambulatory Visit (HOSPITAL_COMMUNITY)
Admission: RE | Admit: 2023-01-11 | Discharge: 2023-01-11 | Disposition: A | Discharge status: Home / Self Care | Payer: Medicare HMO | Source: Ambulatory Visit | Attending: Family Medicine | Admitting: Family Medicine

## 2023-01-11 VITALS — BP 114/56 | HR 80 | Wt 178.0 lb

## 2023-01-11 DIAGNOSIS — Z87891 Personal history of nicotine dependence: Secondary | ICD-10-CM | POA: Insufficient documentation

## 2023-01-11 DIAGNOSIS — I10 Essential (primary) hypertension: Secondary | ICD-10-CM

## 2023-01-11 DIAGNOSIS — Z8719 Personal history of other diseases of the digestive system: Secondary | ICD-10-CM | POA: Diagnosis not present

## 2023-01-11 DIAGNOSIS — G40909 Epilepsy, unspecified, not intractable, without status epilepticus: Secondary | ICD-10-CM | POA: Insufficient documentation

## 2023-01-11 DIAGNOSIS — Z79899 Other long term (current) drug therapy: Secondary | ICD-10-CM | POA: Diagnosis not present

## 2023-01-11 DIAGNOSIS — E611 Iron deficiency: Secondary | ICD-10-CM

## 2023-01-11 DIAGNOSIS — I110 Hypertensive heart disease with heart failure: Secondary | ICD-10-CM | POA: Insufficient documentation

## 2023-01-11 DIAGNOSIS — J439 Emphysema, unspecified: Secondary | ICD-10-CM | POA: Diagnosis not present

## 2023-01-11 DIAGNOSIS — I272 Pulmonary hypertension, unspecified: Secondary | ICD-10-CM | POA: Diagnosis not present

## 2023-01-11 DIAGNOSIS — J8410 Pulmonary fibrosis, unspecified: Secondary | ICD-10-CM | POA: Diagnosis not present

## 2023-01-11 DIAGNOSIS — E662 Morbid (severe) obesity with alveolar hypoventilation: Secondary | ICD-10-CM | POA: Diagnosis not present

## 2023-01-11 DIAGNOSIS — I484 Atypical atrial flutter: Secondary | ICD-10-CM | POA: Diagnosis not present

## 2023-01-11 DIAGNOSIS — Z6833 Body mass index (BMI) 33.0-33.9, adult: Secondary | ICD-10-CM | POA: Diagnosis not present

## 2023-01-11 DIAGNOSIS — Z8774 Personal history of (corrected) congenital malformations of heart and circulatory system: Secondary | ICD-10-CM | POA: Diagnosis not present

## 2023-01-11 DIAGNOSIS — R5383 Other fatigue: Secondary | ICD-10-CM | POA: Diagnosis not present

## 2023-01-11 DIAGNOSIS — Z7901 Long term (current) use of anticoagulants: Secondary | ICD-10-CM | POA: Insufficient documentation

## 2023-01-11 DIAGNOSIS — I480 Paroxysmal atrial fibrillation: Secondary | ICD-10-CM | POA: Diagnosis not present

## 2023-01-11 DIAGNOSIS — Q211 Atrial septal defect, unspecified: Secondary | ICD-10-CM

## 2023-01-11 DIAGNOSIS — E669 Obesity, unspecified: Secondary | ICD-10-CM | POA: Diagnosis not present

## 2023-01-11 DIAGNOSIS — G4733 Obstructive sleep apnea (adult) (pediatric): Secondary | ICD-10-CM | POA: Diagnosis not present

## 2023-01-11 DIAGNOSIS — Z5982 Transportation insecurity: Secondary | ICD-10-CM | POA: Insufficient documentation

## 2023-01-11 DIAGNOSIS — Z8616 Personal history of COVID-19: Secondary | ICD-10-CM | POA: Insufficient documentation

## 2023-01-11 DIAGNOSIS — R0609 Other forms of dyspnea: Secondary | ICD-10-CM | POA: Diagnosis not present

## 2023-01-11 DIAGNOSIS — I5032 Chronic diastolic (congestive) heart failure: Secondary | ICD-10-CM | POA: Diagnosis not present

## 2023-01-11 LAB — BASIC METABOLIC PANEL
Anion gap: 14 (ref 5–15)
BUN: 17 mg/dL (ref 8–23)
CO2: 28 mmol/L (ref 22–32)
Calcium: 9.2 mg/dL (ref 8.9–10.3)
Chloride: 96 mmol/L — ABNORMAL LOW (ref 98–111)
Creatinine, Ser: 0.9 mg/dL (ref 0.44–1.00)
GFR, Estimated: >60 mL/min (ref >60)
Glucose, Bld: 143 mg/dL — ABNORMAL HIGH (ref 70–99)
Potassium: 3.5 mmol/L (ref 3.5–5.1)
Sodium: 138 mmol/L (ref 135–145)

## 2023-01-11 LAB — BRAIN NATRIURETIC PEPTIDE: B Natriuretic Peptide: 100.8 pg/mL — ABNORMAL HIGH (ref 0.0–100.0)

## 2023-01-11 MED ORDER — AMIODARONE HCL 200 MG PO TABS: 200.0000 mg | ORAL_TABLET | Freq: Two times a day (BID) | ORAL | 3 refills | Status: DC

## 2023-01-11 NOTE — H&P (View-Only) (Signed)
 PCP: [name], MD Cardiology: Dr. Herbie Baltimore HF Cardiology: Dr. Shirlee Latch  77 y.o. with history of OHS/OSA and RV dysfunction was referred by Dr. Herbie Baltimore for evaluation of CHF.  She has a history of OSA/OHS.  She uses CPAP at night but not currently using oxygen during the day.  Remote smoker.  She had an echo in 9/20 showing EF 50-55% but moderate-severe RV dysfunction with mild-moderate RV dilation.  RHC/LHC in 10/20 showed normal coronaries, pulmonary venous hypertension.  V/Q scan in 2/21 did not show evidence for acute on chronic PE.    Cardiomems was placed in 5/21.  RHC at that time showed normal filling pressures and moderate pulmonary hypertension but suspect due to high output.  No evidence for shunt lesion.  Echo showed EF 60-65%, enlarged RV with normal systolic function.   She was admitted in 5/21 with syncope, thought to be due to a seizure.    She had COVID-19 infection in 8/21. 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   Echo in 9/21 with EF 50-55%, severe RV enlargement and severely decreased RV systolic function, PASP 54 mmHg.    She was admitted in 5/22 with CHF, bronchitis.  She was diuresed and treated with antibiotics. Echo in 5/22 showed EF 60-65%, mild LVH, ?normal RV, severe LAE, ?normal PA pressure, IVC normal.  V/Q scan negative.   In 10/22, she was admitted with GI bleeding, thought to be diverticular.  BP meds were decreased and Lasix was cut back.  With increased weight, she increased her Lasix back to 80 mg daily.   She was seen in the ER in 1/24 with atypical atrial flutter, this was new.  She was started on apixaban and sent home from ER. She was seen for follow up and planned to set her up for DCCV, but she was admitted on 07/25/22 with GI bleed, thought to be diverticular.  Apixaban was held and she was tranfused. Apixaban was restarted after discharge.  She did  not have endoscopy.   Follow up 08/01/22, she remained in atypical atrial flutter. Arranged for TEE/DCCV.  TEE/DCCV showed EF 60-65%, mild LVH, RV normal, small ASD to L->R flow, no LAA thrombus. S/p DCCV --> NSR.  RHC (3/24) showed normal filling pressures, moderate pulmonary hypertension in setting of ASD, Qp/Qs 1.5/1. She was referred to Structural Heart team for ASD repair consideration.  S/p ASD repair 7/24. Ltd echo 7/24 showed EF 55-60%, RV moderately reduced.  Today she returns for HF follow up with her sister. Overall feeling fair. Breathing has improved since ASD repair, but she remains quite fatigued. She has dyspnea with housework, does OK with ADLs Feels occasional palpitations. Denies abnormal bleeding, CP, dizziness, edema, or PND/Orthopnea. Appetite ok. No fever or chills. Weight at home 178 pounds. Taking all medications.    ECG (personally reviewed): atrial fibrillation, 78 bpm  Cardiomems: PAD 23 today, goal 20  Labs (10/20): K 4.1, creatinine 1.15, hgb 11.4 Labs (6/21): K 3.5, creatinine 1.04, BNP 81 Labs (12/21): K 3.7, creatinine 0.79 Labs (11/22): LDL 63, BNP 33 Labs (2/23): K 3.8, creatinine 0.97 Labs (9/23): K 4.1, creatinine 0.97 Labs (1/24): K 3.5, creatinine 0.92, BNP 167 Labs (2/24): transferrin saturation 7%, hgb 8.1 Labs (3/24): K 4.0, creatinine 0.99 Labs (7/24): K 3.8, creatinine 0.90  PMH: 1. OHS/OSA: She uses CPAP.  2. H/o CVA 3. Seizure disorder 4. Type 2 DM 5. Depression 6. HTN  7. Hyperlipidemia 8. Chronic diastolic CHF: Echo (9/20) with EF 50-55%, moderate to severe RV dilation with mild-moderately decreased RV systolic function.  - RHC/LHC (10/20): normal coronaries; mean RA 10, PA 56/14 mean 31, mean PCWP 13, LVEDP 18, CO/CI 7.35/3.73, PVR 2.44.  - RHC/Cardiomems placement (5/21): mean RA 7, PA 58/21 mean 34, PCWP mean 12, CI 8.3 F/3.95 T, PVR 2.86 WU. No evidence for shunt lesion.  - Echo (5/21): EF 60-65%, RV enlarged with normal systolic  function.  - Echo (9/21): EF 50-55%, severe RV enlargement and severely decreased RV systolic function, PASP 54 mmHg.  - Echo (5/22): EF 60-65%, mild LVH, ?normal RV, severe LAE, ?normal PA pressure, IVC normal. - TEE (2/23): EF 60-65%, mild LVH, RV normal - RHC (3/24): RA mean 6, PA 57/12 (mean 31), PCWP mean 13, CO/CI (Fick) 17/9.4, Qp/Qs 1.5/1 9. Primarily pulmonary venous hypertension.  - V/Q scan (2/21, 5/22): No evidence for acute or chronic PE.  - PFTs normal in 5/21.  10. COVID-19 PNA 8/21.  - CT chest in 10/21 with emphysema and mild fibrosis concerning for post-infectious inflammatory fibrosis from COVID.  11. GI bleeding: 10/22, diverticular bleed.  - Presumed diverticular bleed in 1/24.  12. Atypical atrial flutter/atrial fibrillation: First noted 1/24.  - TEE/DCCV (2/24) to NSR 13. ASD: RHC (3/24) showed Qp/Qs 1.5/1, suspect hemodynamically significant ASD. - s/p ASD closer (7/24)  Social History   Socioeconomic History   Marital status: Divorced    Spouse name: Not on file   Number of children: Not on file   Years of education: Not on file   Highest education level: Not on file  Occupational History   Occupation: Disabled    Comment: CNA  Tobacco Use   Smoking status: Former    Current packs/day: 0.50    Average packs/day: 0.5 packs/day for 25.0 years (12.5 ttl pk-yrs)    Types: Cigarettes   Smokeless tobacco: Never   Tobacco comments:    quit smoking 20+ytrs ago  Vaping Use   Vaping status: Never Used  Substance and Sexual Activity   Alcohol use: No   Drug use: No   Sexual activity: Not Currently    Birth control/protection: Surgical  Other Topics Concern   Not on file  Social History Narrative   She lives in [place]. She has lots of family in the area and is accompanied by her sister.   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   She has a distant history of smoking, quit 20 years ago.   Right handed    Social  Determinants of Health   Financial Resource Strain: Low Risk  (07/18/2022)   Overall Financial Resource Strain (CARDIA)    Difficulty of Paying Living Expenses: Not hard at all  Food Insecurity: No Food Insecurity (07/18/2022)   Hunger Vital Sign    Worried About Running Out of Food in the Last Year: Never true    Ran Out of Food in the Last Year: Never true  Transportation Needs: Unmet Transportation Needs (11/28/2022)   PRAPARE - Administrator, Civil Service (Medical): Yes    Lack of Transportation (Non-Medical): Yes  Physical Activity: Inactive (07/18/2022)   Exercise Vital Sign    Days of Exercise per Week: 0 days    Minutes of Exercise per Session: 0 min  Stress: No Stress Concern Present (07/18/2022)   [employer] of Occupational Health - Occupational Stress Questionnaire    Feeling of Stress :  Not at all  Social Connections: Not on file  Intimate Partner Violence: Not At Risk (10/22/2018)   Humiliation, Afraid, Rape, and Kick questionnaire    Fear of Current or Ex-Partner: No    Emotionally Abused: No    Physically Abused: No    Sexually Abused: No   Family History  Problem Relation Age of Onset   Allergies Father    Heart disease Father        before 24   Heart failure Father    Hypertension Father    Hyperlipidemia Father    Heart disease Mother    Diabetes Mother    Hypertension Mother    Hyperlipidemia Mother    Hypertension Sister    Heart disease Sister        before 11   Hyperlipidemia Sister    Diabetes Brother    Hypertension Brother    Diabetes Sister    Hypertension Sister    Diabetes Son    Hypertension Son    Cancer Other    Current Outpatient Medications  Medication Sig Dispense Refill   acetaminophen (TYLENOL) 500 MG tablet Take 500-1,000 mg by mouth every 4 (four) hours as needed for moderate pain.     albuterol (VENTOLIN HFA) 108 (90 Base) MCG/ACT inhaler Inhale 1-2 puffs into the lungs every 6 (six) hours as needed for  wheezing or shortness of breath. 18 g 5   Alcaftadine (LASTACAFT) 0.25 % SOLN Place 1-2 drops into both eyes daily as needed (allergy eye/irritation.).     Alcohol Swabs (DROPSAFE ALCOHOL PREP) 70 % PADS USE TWO TIMES DAILY AS DIRECTED 200 each 3   amiodarone (PACERONE) 200 MG tablet Take 1 tablet (200 mg total) by mouth 2 (two) times daily. THEN 200 DAILY AFTER THE 2 WEEKS> 90 tablet 3   apixaban (ELIQUIS) 5 MG TABS tablet Take 1 tablet (5 mg total) by mouth 2 (two) times daily. 180 tablet 1   Ascorbic Acid (VITAMIN C) 1000 MG tablet Take 1,000 mg by mouth in the morning.     atorvastatin (LIPITOR) 40 MG tablet Take 1 tablet (40 mg total) by mouth daily. 90 tablet 3   B-D ULTRAFINE III SHORT PEN 31G X 8 MM MISC USE DAILY WITH VICTOZA AND/OR NOVOLOG. 400 each 2   benzonatate (TESSALON) 100 MG capsule TAKE 1 CAPSULE THREE TIMES DAILY AS NEEDED FOR COUGH 30 capsule 1   Blood Glucose Monitoring Suppl (TRUE METRIX METER) w/Device KIT USE AS DIRECTED 1 kit 1   carvedilol (COREG) 12.5 MG tablet TAKE 1 TABLET TWICE DAILY 180 tablet 3   dexlansoprazole (DEXILANT) 60 MG capsule Take 60 mg by mouth daily.     diclofenac sodium (VOLTAREN) 1 % GEL Apply 2 g topically 4 (four) times daily as needed (pain).     ferrous sulfate 324 MG TBEC Take 324 mg by mouth daily with breakfast.     furosemide (LASIX) 20 MG tablet TAKE 4 TABLETS (80 MG) BY MOUTH IN THE MORNING AND 3 TABLETS (60 MG) EVERY EVENING. (Patient taking differently: Take 60-80 mg by mouth See admin instructions. Take 80 mg in the morning and 60 mg in the evening) 630 tablet 3   Glucagon (GVOKE HYPOPEN 2-PACK) 0.5 MG/0.1ML SOAJ INJECT 0.5 MG (1 PEN) INTO THE SKIN DAILY AS NEEDED. 2 mL 11   guaiFENesin-dextromethorphan (ROBITUSSIN DM) 100-10 MG/5ML syrup Take 10 mLs by mouth every 6 (six) hours as needed for cough. 118 mL 0   hydrALAZINE (APRESOLINE) 100 MG  tablet Take 1 tablet (100 mg total) by mouth 3 (three) times daily. 270 tablet 3   hydrocortisone  cream 1 % Apply 1 application topically daily as needed for itching.      insulin glargine, 1 Unit Dial, (TOUJEO) 300 UNIT/ML Solostar Pen Inject 20 Units into the skin at bedtime.     insulin lispro (HUMALOG KWIKPEN) 100 UNIT/ML KwikPen Inject 0-10 Units into the skin See admin instructions. Sliding scale:Inject  (high blood sugar over 150). 150-199=2 units;200-249=4 units,250-299=6 units;300-349=8 units;350 above 10 units: Over 350 call physician 15 mL 0   Isopropyl Alcohol (ALCOHOL WIPES) 70 % MISC Apply 1 each topically 2 (two) times daily. 300 each 3   isosorbide mononitrate (IMDUR) 30 MG 24 hr tablet Take 1 tablet (30 mg total) by mouth daily. 90 tablet 3   lamoTRIgine (LAMICTAL) 150 MG tablet Take 1 tablet (150 mg total) by mouth 2 (two) times daily. 180 tablet 3   levETIRAcetam (KEPPRA) 500 MG tablet Take 2 tablets every night 180 tablet 3   loratadine (CLARITIN) 10 MG tablet Take 1 tablet (10 mg total) by mouth daily. 30 tablet 2   oxyCODONE (OXY IR/ROXICODONE) 5 MG immediate release tablet Take 5 mg by mouth every 6 (six) hours as needed (pain.).     OZEMPIC, 1 MG/DOSE, 4 MG/3ML SOPN INJECT 1MG  UNDER THE SKIN ONE TIME WEEKLY (Patient taking differently: Inject 1 mg into the skin every Wednesday at 6 PM. INJECT 1MG  UNDER THE SKIN ONE TIME WEEKLY) 9 mL 3   Pancrelipase, Lip-Prot-Amyl, 25000-79000 units CPEP Take 1-2 capsules by mouth See admin instructions. Take 2 capsules in the morning with breakfast, 1 capsule with lunch, and 2 capsules with supper     potassium chloride SA (KLOR-CON M) 20 MEQ tablet Take 2 tablets (40 mEq total) by mouth daily. With additional on metolazone days (Patient taking differently: Take 20-40 mEq by mouth See admin instructions. Take 2 tablets (40 meq) in the morning and take 1 tablet (20 meq) by mouth at night) 300 tablet 3   sennosides-docusate sodium (SENOKOT-S) 8.6-50 MG tablet Take 1-2 tablets by mouth at bedtime as needed for constipation.     sertraline  (ZOLOFT) 50 MG tablet TAKE 1 TABLET EVERY DAY (Patient taking differently: Take 50 mg by mouth every evening.) 90 tablet 3   tiZANidine (ZANAFLEX) 2 MG tablet TAKE 1 TABLET AT BEDTIME AS NEEDED 30 tablet 3   tolnaftate (TINACTIN) 1 % cream Apply 1 application topically daily as needed (foot fungus).      TRUE METRIX BLOOD GLUCOSE TEST test strip USE AS DIRECTED TO CHECK BLOOD SUGARS 2 TIMES PER DAY 200 strip 10   TRUEplus Lancets 33G MISC TEST BLOOD SUGAR TWICE DAILY AS DIRECTED 200 each 3   FeFum-FePoly-FA-B Cmp-C-Biot (INTEGRA PLUS) CAPS Take 1 capsule by mouth every morning. (Patient not taking: Reported on 01/11/2023) 30 capsule 2   No current facility-administered medications for this encounter.   Facility-Administered Medications Ordered in Other Encounters  Medication Dose Route Frequency Provider Last Rate Last Admin   0.9 %  sodium chloride infusion (Manually program via Guardrails IV Fluids)   Intravenous Once Thomas, Jessica M, FNP       0.9 %  sodium chloride infusion (Manually program via Guardrails IV Fluids)   Intravenous Once [place], [place]       Wt Readings from Last 3 Encounters:  01/11/23 80.7 kg (178 lb)  01/02/23 78.9 kg (174 lb)  12/31/22 79.5 kg (  175 lb 3.2 oz)   BP (!) 114/56   Pulse 80   Wt 80.7 kg (178 lb)   SpO2 97%   BMI 33.63 kg/m  Physical Exam General:  NAD. No resp difficulty, walked into clinic, fatigued-appearing HEENT: Normal Neck: Supple. No JVD. Carotids 2+ bilat; no bruits. No lymphadenopathy or thryomegaly appreciated. Cor: PMI nondisplaced. Irregular rate & rhythm. No rubs, gallops or murmurs. Lungs: Clear Abdomen: Soft, nontender, nondistended. No hepatosplenomegaly. No bruits or masses. Good bowel sounds. Extremities: No cyanosis, clubbing, rash, edema Neuro: Alert & oriented x 3, cranial nerves grossly intact. Moves all 4 extremities w/o difficulty. Affect pleasant.  Assessment/Plan: 1. Chronic diastolic CHF/RV failure: Echo in  9/20 showed EF 50-55% with moderate-severe RV dilation and mild-moderately decreased systolic function. RHC/LHC showed mild-moderate pulmonary venous hypertension and no significant coronary disease.  Cause of RV dysfunction is not clear, possibly due to diastolic CHF + OHS/OSA.  V/Q scan in 2/21 did not show acute or chronic PE.  Cardiomems was placed in 5/21, normal filling pressures with moderate pulmonary hypertension likely due to high output (no evidence for shunt lesion on RHC).  Echo in 9/21 with EF 50-55%, severe RVE with severe RV dysfunction.  Echo in 5/22 with EF 60-65%, RV function reportedly normal with normal PA pressure.  Her dyspnea is partially related to lung disease; based on last CT, she has developed mild pulmonary fibrosis since COVID, possibly post-inflammatory.  TEE (2/24) showed EF 60-65%, RV normal. RHC (3/24) with normal filling pressures and moderate pulmonary hypertension in setting of ASD. NYHA III, suspect symptoms related to atrial fibrillation. She is not volume overloaded on exam today and weight has trended down.  - Continue Lasix 80 qam/60 qpm.  BMET/BNP today.  - Continue Coreg 12.5 mg bid. - She did not tolerate SGLT2-inhibitor due to recurrent yeast infections.    2. OHS/OSA: She is using CPAP but not oxygen during the day (no longer appears to need). CT chest in 10/21 with emphysema and mild fibrosis, possibly post-inflammatory after COVID-19 infection.   - Followup with pulmonary (Dr. Isaiah Serge).  3. Pulmonary hypertension: RHC in 10/20 showed mild-moderate pulmonary venous hypertension.  RHC in 5/21 showed moderate pulmonary hypertension with low PVR and high CO, likely PH due to high output, no evidence for shunt lesion. No role for selective pulmonary vasodilators.  - On recent TEE (2/24), small secundum ASD seen with left to right flow. RHC (3/24) showed moderate pulmonary hypertension in setting of ASD, Qp/Qs 1.5/1 4. HTN: BP controlled on hydralazine, Imdur and  Coreg.  5. Obesity: Body mass index is 33.63 kg/m. - She is on semaglutide, weight continues to trend down.  6. GI bleeding: Recent presumed recurrent diverticular bleed in 1/24.  Apixaban stopped transiently.  7. Atypical atrial flutter/atrial fibrillation: Atypical flutter at follow up 2/24.  HR controlled on Coreg. S/p TEE/DCCV to NSR. Back in AFL 7/24, rate controlled.  - Start amiodarone 200 mg bid x 2 weeks, then decrease to 200 mg daily. We discussed side effects - Continue Coreg.  - Continue apixaban. She has not missed any doses. CBC today, no bleeding issues. - Will attempt repeat DCCV, with amio support this time. Discussed procedure risks/benefits and she agrees. Discussed with Dr. Shirlee Latch - Watchman would be a reasonable consideration in the future, especially if she bleeds again.   Informed Consent   Shared Decision Making/Informed Consent The risks (stroke, cardiac arrhythmias rarely resulting in the need for a temporary or permanent pacemaker, skin  irritation or burns and complications associated with conscious sedation including aspiration, arrhythmia, respiratory failure and death), benefits (restoration of normal sinus rhythm) and alternatives of a direct current cardioversion were explained in detail to Ms. Armenteros and she agrees to proceed.    8. Fe deficiency: She has had Feraheme infusion 2/24, may be contributing to current symptoms. 9. ASD: small 1.9 x 1.2 cm relatively high secundum ASD with left to right flow seen on TEE. RHC (3/24) showed hemodynamically significant ASD with Qp/Qs 1.5/1. - s/p ASD closure 7/24 10. Fatigue: Likely multifactorial. Complaint with CPAP. Now s/p ASD. Recent iron panel improved. - Suspect atrial fibrillation contributing. DCCV as above  Follow up with APP 2 weeks after cardioversion.  Anderson Malta [facility] FNP-BC 01/11/2023

## 2023-01-11 NOTE — Progress Notes (Signed)
PCP: [name], MD Cardiology: Dr. Herbie Baltimore HF Cardiology: Dr. Shirlee Latch  77 y.o. with history of OHS/OSA and RV dysfunction was referred by Dr. Herbie Baltimore for evaluation of CHF.  She has a history of OSA/OHS.  She uses CPAP at night but not currently using oxygen during the day.  Remote smoker.  She had an echo in 9/20 showing EF 50-55% but moderate-severe RV dysfunction with mild-moderate RV dilation.  RHC/LHC in 10/20 showed normal coronaries, pulmonary venous hypertension.  V/Q scan in 2/21 did not show evidence for acute on chronic PE.    Cardiomems was placed in 5/21.  RHC at that time showed normal filling pressures and moderate pulmonary hypertension but suspect due to high output.  No evidence for shunt lesion.  Echo showed EF 60-65%, enlarged RV with normal systolic function.   She was admitted in 5/21 with syncope, thought to be due to a seizure.    She had COVID-19 infection in 8/21. 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   Echo in 9/21 with EF 50-55%, severe RV enlargement and severely decreased RV systolic function, PASP 54 mmHg.    She was admitted in 5/22 with CHF, bronchitis.  She was diuresed and treated with antibiotics. Echo in 5/22 showed EF 60-65%, mild LVH, ?normal RV, severe LAE, ?normal PA pressure, IVC normal.  V/Q scan negative.   In 10/22, she was admitted with GI bleeding, thought to be diverticular.  BP meds were decreased and Lasix was cut back.  With increased weight, she increased her Lasix back to 80 mg daily.   She was seen in the ER in 1/24 with atypical atrial flutter, this was new.  She was started on apixaban and sent home from ER. She was seen for follow up and planned to set her up for DCCV, but she was admitted on 07/25/22 with GI bleed, thought to be diverticular.  Apixaban was held and she was tranfused. Apixaban was restarted after discharge.  She did  not have endoscopy.   Follow up 08/01/22, she remained in atypical atrial flutter. Arranged for TEE/DCCV.  TEE/DCCV showed EF 60-65%, mild LVH, RV normal, small ASD to L->R flow, no LAA thrombus. S/p DCCV --> NSR.  RHC (3/24) showed normal filling pressures, moderate pulmonary hypertension in setting of ASD, Qp/Qs 1.5/1. She was referred to Structural Heart team for ASD repair consideration.  S/p ASD repair 7/24. Ltd echo 7/24 showed EF 55-60%, RV moderately reduced.  Today she returns for HF follow up with her sister. Overall feeling fair. Breathing has improved since ASD repair, but she remains quite fatigued. She has dyspnea with housework, does OK with ADLs Feels occasional palpitations. Denies abnormal bleeding, CP, dizziness, edema, or PND/Orthopnea. Appetite ok. No fever or chills. Weight at home 178 pounds. Taking all medications.    ECG (personally reviewed): atrial fibrillation, 78 bpm  Cardiomems: PAD 23 today, goal 20  Labs (10/20): K 4.1, creatinine 1.15, hgb 11.4 Labs (6/21): K 3.5, creatinine 1.04, BNP 81 Labs (12/21): K 3.7, creatinine 0.79 Labs (11/22): LDL 63, BNP 33 Labs (2/23): K 3.8, creatinine 0.97 Labs (9/23): K 4.1, creatinine 0.97 Labs (1/24): K 3.5, creatinine 0.92, BNP 167 Labs (2/24): transferrin saturation 7%, hgb 8.1 Labs (3/24): K 4.0, creatinine 0.99 Labs (7/24): K 3.8, creatinine 0.90  PMH: 1. OHS/OSA: She uses CPAP.  2. H/o CVA 3. Seizure disorder 4. Type 2 DM 5. Depression 6. HTN  7. Hyperlipidemia 8. Chronic diastolic CHF: Echo (9/20) with EF 50-55%, moderate to severe RV dilation with mild-moderately decreased RV systolic function.  - RHC/LHC (10/20): normal coronaries; mean RA 10, PA 56/14 mean 31, mean PCWP 13, LVEDP 18, CO/CI 7.35/3.73, PVR 2.44.  - RHC/Cardiomems placement (5/21): mean RA 7, PA 58/21 mean 34, PCWP mean 12, CI 8.3 F/3.95 T, PVR 2.86 WU. No evidence for shunt lesion.  - Echo (5/21): EF 60-65%, RV enlarged with normal systolic  function.  - Echo (9/21): EF 50-55%, severe RV enlargement and severely decreased RV systolic function, PASP 54 mmHg.  - Echo (5/22): EF 60-65%, mild LVH, ?normal RV, severe LAE, ?normal PA pressure, IVC normal. - TEE (2/23): EF 60-65%, mild LVH, RV normal - RHC (3/24): RA mean 6, PA 57/12 (mean 31), PCWP mean 13, CO/CI (Fick) 17/9.4, Qp/Qs 1.5/1 9. Primarily pulmonary venous hypertension.  - V/Q scan (2/21, 5/22): No evidence for acute or chronic PE.  - PFTs normal in 5/21.  10. COVID-19 PNA 8/21.  - CT chest in 10/21 with emphysema and mild fibrosis concerning for post-infectious inflammatory fibrosis from COVID.  11. GI bleeding: 10/22, diverticular bleed.  - Presumed diverticular bleed in 1/24.  12. Atypical atrial flutter/atrial fibrillation: First noted 1/24.  - TEE/DCCV (2/24) to NSR 13. ASD: RHC (3/24) showed Qp/Qs 1.5/1, suspect hemodynamically significant ASD. - s/p ASD closer (7/24)  Social History   Socioeconomic History   Marital status: Divorced    Spouse name: Not on file   Number of children: Not on file   Years of education: Not on file   Highest education level: Not on file  Occupational History   Occupation: Disabled    Comment: CNA  Tobacco Use   Smoking status: Former    Current packs/day: 0.50    Average packs/day: 0.5 packs/day for 25.0 years (12.5 ttl pk-yrs)    Types: Cigarettes   Smokeless tobacco: Never   Tobacco comments:    quit smoking 20+ytrs ago  Vaping Use   Vaping status: Never Used  Substance and Sexual Activity   Alcohol use: No   Drug use: No   Sexual activity: Not Currently    Birth control/protection: Surgical  Other Topics Concern   Not on file  Social History Narrative   She lives in [place]. She has lots of family in the area and is accompanied by her sister.   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   She has a distant history of smoking, quit 20 years ago.   Right handed    Social  Determinants of Health   Financial Resource Strain: Low Risk  (07/18/2022)   Overall Financial Resource Strain (CARDIA)    Difficulty of Paying Living Expenses: Not hard at all  Food Insecurity: No Food Insecurity (07/18/2022)   Hunger Vital Sign    Worried About Running Out of Food in the Last Year: Never true    Ran Out of Food in the Last Year: Never true  Transportation Needs: Unmet Transportation Needs (11/28/2022)   PRAPARE - Administrator, Civil Service (Medical): Yes    Lack of Transportation (Non-Medical): Yes  Physical Activity: Inactive (07/18/2022)   Exercise Vital Sign    Days of Exercise per Week: 0 days    Minutes of Exercise per Session: 0 min  Stress: No Stress Concern Present (07/18/2022)   [employer] of Occupational Health - Occupational Stress Questionnaire    Feeling of Stress :  Not at all  Social Connections: Not on file  Intimate Partner Violence: Not At Risk (10/22/2018)   Humiliation, Afraid, Rape, and Kick questionnaire    Fear of Current or Ex-Partner: No    Emotionally Abused: No    Physically Abused: No    Sexually Abused: No   Family History  Problem Relation Age of Onset   Allergies Father    Heart disease Father        before 24   Heart failure Father    Hypertension Father    Hyperlipidemia Father    Heart disease Mother    Diabetes Mother    Hypertension Mother    Hyperlipidemia Mother    Hypertension Sister    Heart disease Sister        before 11   Hyperlipidemia Sister    Diabetes Brother    Hypertension Brother    Diabetes Sister    Hypertension Sister    Diabetes Son    Hypertension Son    Cancer Other    Current Outpatient Medications  Medication Sig Dispense Refill   acetaminophen (TYLENOL) 500 MG tablet Take 500-1,000 mg by mouth every 4 (four) hours as needed for moderate pain.     albuterol (VENTOLIN HFA) 108 (90 Base) MCG/ACT inhaler Inhale 1-2 puffs into the lungs every 6 (six) hours as needed for  wheezing or shortness of breath. 18 g 5   Alcaftadine (LASTACAFT) 0.25 % SOLN Place 1-2 drops into both eyes daily as needed (allergy eye/irritation.).     Alcohol Swabs (DROPSAFE ALCOHOL PREP) 70 % PADS USE TWO TIMES DAILY AS DIRECTED 200 each 3   amiodarone (PACERONE) 200 MG tablet Take 1 tablet (200 mg total) by mouth 2 (two) times daily. THEN 200 DAILY AFTER THE 2 WEEKS> 90 tablet 3   apixaban (ELIQUIS) 5 MG TABS tablet Take 1 tablet (5 mg total) by mouth 2 (two) times daily. 180 tablet 1   Ascorbic Acid (VITAMIN C) 1000 MG tablet Take 1,000 mg by mouth in the morning.     atorvastatin (LIPITOR) 40 MG tablet Take 1 tablet (40 mg total) by mouth daily. 90 tablet 3   B-D ULTRAFINE III SHORT PEN 31G X 8 MM MISC USE DAILY WITH VICTOZA AND/OR NOVOLOG. 400 each 2   benzonatate (TESSALON) 100 MG capsule TAKE 1 CAPSULE THREE TIMES DAILY AS NEEDED FOR COUGH 30 capsule 1   Blood Glucose Monitoring Suppl (TRUE METRIX METER) w/Device KIT USE AS DIRECTED 1 kit 1   carvedilol (COREG) 12.5 MG tablet TAKE 1 TABLET TWICE DAILY 180 tablet 3   dexlansoprazole (DEXILANT) 60 MG capsule Take 60 mg by mouth daily.     diclofenac sodium (VOLTAREN) 1 % GEL Apply 2 g topically 4 (four) times daily as needed (pain).     ferrous sulfate 324 MG TBEC Take 324 mg by mouth daily with breakfast.     furosemide (LASIX) 20 MG tablet TAKE 4 TABLETS (80 MG) BY MOUTH IN THE MORNING AND 3 TABLETS (60 MG) EVERY EVENING. (Patient taking differently: Take 60-80 mg by mouth See admin instructions. Take 80 mg in the morning and 60 mg in the evening) 630 tablet 3   Glucagon (GVOKE HYPOPEN 2-PACK) 0.5 MG/0.1ML SOAJ INJECT 0.5 MG (1 PEN) INTO THE SKIN DAILY AS NEEDED. 2 mL 11   guaiFENesin-dextromethorphan (ROBITUSSIN DM) 100-10 MG/5ML syrup Take 10 mLs by mouth every 6 (six) hours as needed for cough. 118 mL 0   hydrALAZINE (APRESOLINE) 100 MG  tablet Take 1 tablet (100 mg total) by mouth 3 (three) times daily. 270 tablet 3   hydrocortisone  cream 1 % Apply 1 application topically daily as needed for itching.      insulin glargine, 1 Unit Dial, (TOUJEO) 300 UNIT/ML Solostar Pen Inject 20 Units into the skin at bedtime.     insulin lispro (HUMALOG KWIKPEN) 100 UNIT/ML KwikPen Inject 0-10 Units into the skin See admin instructions. Sliding scale:Inject  (high blood sugar over 150). 150-199=2 units;200-249=4 units,250-299=6 units;300-349=8 units;350 above 10 units: Over 350 call physician 15 mL 0   Isopropyl Alcohol (ALCOHOL WIPES) 70 % MISC Apply 1 each topically 2 (two) times daily. 300 each 3   isosorbide mononitrate (IMDUR) 30 MG 24 hr tablet Take 1 tablet (30 mg total) by mouth daily. 90 tablet 3   lamoTRIgine (LAMICTAL) 150 MG tablet Take 1 tablet (150 mg total) by mouth 2 (two) times daily. 180 tablet 3   levETIRAcetam (KEPPRA) 500 MG tablet Take 2 tablets every night 180 tablet 3   loratadine (CLARITIN) 10 MG tablet Take 1 tablet (10 mg total) by mouth daily. 30 tablet 2   oxyCODONE (OXY IR/ROXICODONE) 5 MG immediate release tablet Take 5 mg by mouth every 6 (six) hours as needed (pain.).     OZEMPIC, 1 MG/DOSE, 4 MG/3ML SOPN INJECT 1MG  UNDER THE SKIN ONE TIME WEEKLY (Patient taking differently: Inject 1 mg into the skin every Wednesday at 6 PM. INJECT 1MG  UNDER THE SKIN ONE TIME WEEKLY) 9 mL 3   Pancrelipase, Lip-Prot-Amyl, 25000-79000 units CPEP Take 1-2 capsules by mouth See admin instructions. Take 2 capsules in the morning with breakfast, 1 capsule with lunch, and 2 capsules with supper     potassium chloride SA (KLOR-CON M) 20 MEQ tablet Take 2 tablets (40 mEq total) by mouth daily. With additional on metolazone days (Patient taking differently: Take 20-40 mEq by mouth See admin instructions. Take 2 tablets (40 meq) in the morning and take 1 tablet (20 meq) by mouth at night) 300 tablet 3   sennosides-docusate sodium (SENOKOT-S) 8.6-50 MG tablet Take 1-2 tablets by mouth at bedtime as needed for constipation.     sertraline  (ZOLOFT) 50 MG tablet TAKE 1 TABLET EVERY DAY (Patient taking differently: Take 50 mg by mouth every evening.) 90 tablet 3   tiZANidine (ZANAFLEX) 2 MG tablet TAKE 1 TABLET AT BEDTIME AS NEEDED 30 tablet 3   tolnaftate (TINACTIN) 1 % cream Apply 1 application topically daily as needed (foot fungus).      TRUE METRIX BLOOD GLUCOSE TEST test strip USE AS DIRECTED TO CHECK BLOOD SUGARS 2 TIMES PER DAY 200 strip 10   TRUEplus Lancets 33G MISC TEST BLOOD SUGAR TWICE DAILY AS DIRECTED 200 each 3   FeFum-FePoly-FA-B Cmp-C-Biot (INTEGRA PLUS) CAPS Take 1 capsule by mouth every morning. (Patient not taking: Reported on 01/11/2023) 30 capsule 2   No current facility-administered medications for this encounter.   Facility-Administered Medications Ordered in Other Encounters  Medication Dose Route Frequency Provider Last Rate Last Admin   0.9 %  sodium chloride infusion (Manually program via Guardrails IV Fluids)   Intravenous Once Thomas, Eusevio Schriver M, FNP       0.9 %  sodium chloride infusion (Manually program via Guardrails IV Fluids)   Intravenous Once [place], [place]       Wt Readings from Last 3 Encounters:  01/11/23 80.7 kg (178 lb)  01/02/23 78.9 kg (174 lb)  12/31/22 79.5 kg (  175 lb 3.2 oz)   BP (!) 114/56   Pulse 80   Wt 80.7 kg (178 lb)   SpO2 97%   BMI 33.63 kg/m  Physical Exam General:  NAD. No resp difficulty, walked into clinic, fatigued-appearing HEENT: Normal Neck: Supple. No JVD. Carotids 2+ bilat; no bruits. No lymphadenopathy or thryomegaly appreciated. Cor: PMI nondisplaced. Irregular rate & rhythm. No rubs, gallops or murmurs. Lungs: Clear Abdomen: Soft, nontender, nondistended. No hepatosplenomegaly. No bruits or masses. Good bowel sounds. Extremities: No cyanosis, clubbing, rash, edema Neuro: Alert & oriented x 3, cranial nerves grossly intact. Moves all 4 extremities w/o difficulty. Affect pleasant.  Assessment/Plan: 1. Chronic diastolic CHF/RV failure: Echo in  9/20 showed EF 50-55% with moderate-severe RV dilation and mild-moderately decreased systolic function. RHC/LHC showed mild-moderate pulmonary venous hypertension and no significant coronary disease.  Cause of RV dysfunction is not clear, possibly due to diastolic CHF + OHS/OSA.  V/Q scan in 2/21 did not show acute or chronic PE.  Cardiomems was placed in 5/21, normal filling pressures with moderate pulmonary hypertension likely due to high output (no evidence for shunt lesion on RHC).  Echo in 9/21 with EF 50-55%, severe RVE with severe RV dysfunction.  Echo in 5/22 with EF 60-65%, RV function reportedly normal with normal PA pressure.  Her dyspnea is partially related to lung disease; based on last CT, she has developed mild pulmonary fibrosis since COVID, possibly post-inflammatory.  TEE (2/24) showed EF 60-65%, RV normal. RHC (3/24) with normal filling pressures and moderate pulmonary hypertension in setting of ASD. NYHA III, suspect symptoms related to atrial fibrillation. She is not volume overloaded on exam today and weight has trended down.  - Continue Lasix 80 qam/60 qpm.  BMET/BNP today.  - Continue Coreg 12.5 mg bid. - She did not tolerate SGLT2-inhibitor due to recurrent yeast infections.    2. OHS/OSA: She is using CPAP but not oxygen during the day (no longer appears to need). CT chest in 10/21 with emphysema and mild fibrosis, possibly post-inflammatory after COVID-19 infection.   - Followup with pulmonary (Dr. Isaiah Serge).  3. Pulmonary hypertension: RHC in 10/20 showed mild-moderate pulmonary venous hypertension.  RHC in 5/21 showed moderate pulmonary hypertension with low PVR and high CO, likely PH due to high output, no evidence for shunt lesion. No role for selective pulmonary vasodilators.  - On recent TEE (2/24), small secundum ASD seen with left to right flow. RHC (3/24) showed moderate pulmonary hypertension in setting of ASD, Qp/Qs 1.5/1 4. HTN: BP controlled on hydralazine, Imdur and  Coreg.  5. Obesity: Body mass index is 33.63 kg/m. - She is on semaglutide, weight continues to trend down.  6. GI bleeding: Recent presumed recurrent diverticular bleed in 1/24.  Apixaban stopped transiently.  7. Atypical atrial flutter/atrial fibrillation: Atypical flutter at follow up 2/24.  HR controlled on Coreg. S/p TEE/DCCV to NSR. Back in AFL 7/24, rate controlled.  - Start amiodarone 200 mg bid x 2 weeks, then decrease to 200 mg daily. We discussed side effects - Continue Coreg.  - Continue apixaban. She has not missed any doses. CBC today, no bleeding issues. - Will attempt repeat DCCV, with amio support this time. Discussed procedure risks/benefits and she agrees. Discussed with Dr. Shirlee Latch - Watchman would be a reasonable consideration in the future, especially if she bleeds again.   Informed Consent   Shared Decision Making/Informed Consent The risks (stroke, cardiac arrhythmias rarely resulting in the need for a temporary or permanent pacemaker, skin  irritation or burns and complications associated with conscious sedation including aspiration, arrhythmia, respiratory failure and death), benefits (restoration of normal sinus rhythm) and alternatives of a direct current cardioversion were explained in detail to Ms. Armenteros and she agrees to proceed.    8. Fe deficiency: She has had Feraheme infusion 2/24, may be contributing to current symptoms. 9. ASD: small 1.9 x 1.2 cm relatively high secundum ASD with left to right flow seen on TEE. RHC (3/24) showed hemodynamically significant ASD with Qp/Qs 1.5/1. - s/p ASD closure 7/24 10. Fatigue: Likely multifactorial. Complaint with CPAP. Now s/p ASD. Recent iron panel improved. - Suspect atrial fibrillation contributing. DCCV as above  Follow up with APP 2 weeks after cardioversion.  Anderson Malta [facility] FNP-BC 01/11/2023

## 2023-01-11 NOTE — Patient Instructions (Addendum)
Medication Changes:  START taking Amiodarone 200 mg twice a day for TWO WEEKS. AFTER the two weeks take 200 MG DAILY.    *If you need a refill on your cardiac medications before your next appointment, please call your pharmacy*  Lab Work:  Labs done today, your results will be available in MyChart, we will contact you for abnormal readings.  Testing/Procedures:  Your physician has recommended that you have a Cardioversion (DCCV). Electrical Cardioversion uses a jolt of electricity to your heart either through paddles or wired patches attached to your chest. This is a controlled, usually prescheduled, procedure. Defibrillation is done under light anesthesia in the hospital, and you usually go home the day of the procedure. This is done to get your heart back into a normal rhythm. You are not awake for the procedure. Please see the instruction sheet given to you today.   You are scheduled for a Cardioversion on Monday, August 12 with Dr. Shirlee Latch.  Please arrive at the [facility] (Main Entrance A) at [facility]: [address] [place], [place] [address] at 9:30 AM (This time is 1 hour(s) before your procedure to ensure your preparation). Free valet parking service is available. You will check in at ADMITTING. The support person will be asked to wait in the waiting room.  It is OK to have someone drop you off and come back when you are ready to be discharged.     DIET:  Nothing to eat or drink after midnight except a sip of water with medications (see medication instructions below)  MEDICATION INSTRUCTIONS:  Continue taking your anticoagulant (blood thinner): Apixaban (Eliquis).  You will need to continue this after your procedure until you are told by your provider that it is safe to stop.    DO NOT TAKE OZEMPIC ONE WEEK PRIOR TO THE PROCEDURE   DO NOT TAKE LASIX THE MORNING OF THE PROCEDURE.   FYI:  For your safety, and to allow [place] to monitor your vital signs accurately during the  surgery/procedure we request: If you have artificial nails, gel coating, SNS etc, please have those removed prior to your surgery/procedure. Not having the nail coverings /polish removed may result in cancellation or delay of your surgery/procedure.  You must have a responsible person to drive you home and stay in the waiting area during your procedure. Failure to do so could result in cancellation.  Bring your insurance cards.  *Special Note: Every effort is made to have your procedure done on time. Occasionally there are emergencies that occur at the hospital that may cause delays. Please be patient if a delay does occur.      Follow-Up in:   Your physician recommends that you schedule a follow-up appointment in: 2 weeks after the DCCV.    Do the following things EVERYDAY: Weigh yourself in the morning before breakfast. Write it down and keep it in a log. Take your medicines as prescribed Eat low salt foods--Limit salt (sodium) to 2000 mg per day.  Stay as active as you can everyday Limit all fluids for the day to less than 2 liters    Need to Contact us:  If you have any questions or concerns before your next appointment please send [place] a message through [place] or call our office at [phone].    TO LEAVE A MESSAGE FOR THE NURSE SELECT OPTION 2, PLEASE LEAVE A MESSAGE INCLUDING: YOUR NAME DATE OF BIRTH CALL BACK NUMBER REASON FOR CALL**this is important as we  prioritize the call backs  YOU WILL RECEIVE A CALL BACK THE SAME DAY AS LONG AS YOU CALL BEFORE 4:00 PM   At the [facility], you and your health needs are our priority. As part of our continuing mission to provide you with exceptional heart care, we have created designated Provider Care Teams. These Care Teams include your primary Cardiologist (physician) and Advanced Practice Providers (APPs- Physician Assistants and Nurse Practitioners) who all work together to provide you with the care you need,  when you need it.   You may see any of the following providers on your designated Care Team at your next follow up: Dr Arvilla Meres Dr Marca Ancona Dr. Marcos Eke, NP Robbie Lis, [place] [facility] [place], [place] Brynda Peon, NP Karle Plumber, PharmD   Please be sure to bring in all your medications bottles to every appointment.    Thank you for choosing [facility]

## 2023-01-15 ENCOUNTER — Telehealth (HOSPITAL_COMMUNITY): Payer: Self-pay | Admitting: Cardiology

## 2023-01-15 MED ORDER — AMIODARONE HCL 200 MG PO TABS: ORAL_TABLET | ORAL | 3 refills | Status: DC

## 2023-01-15 NOTE — Telephone Encounter (Signed)
Patient called to request meds to local pharmacy

## 2023-01-17 DIAGNOSIS — I1310 Hypertensive heart and chronic kidney disease without heart failure, with stage 1 through stage 4 chronic kidney disease, or unspecified chronic kidney disease: Secondary | ICD-10-CM | POA: Insufficient documentation

## 2023-01-17 DIAGNOSIS — M1A0410 Idiopathic chronic gout, right hand, without tophus (tophi): Secondary | ICD-10-CM | POA: Insufficient documentation

## 2023-01-17 NOTE — Assessment & Plan Note (Signed)
I will check uric acid level today. She is encouraged to stay well hydrated and avoid known triggers including  shellfish, beef, etc.

## 2023-01-17 NOTE — Assessment & Plan Note (Signed)
She is encouraged to strive for BMI less than 30 to decrease cardiac risk. Advised to aim for at least 150 minutes of exercise per week.  

## 2023-01-17 NOTE — Assessment & Plan Note (Signed)
Chronic, small secundum ASD with left to right flow seen on TEE. Right heart cath (3/24) showed hemodynamically significant ASD.  cheduled for closure on 7/10.

## 2023-01-17 NOTE — Assessment & Plan Note (Signed)
Chronic, encouraged to follow low sodium diet. Most recent Cardiology notes reviewed, sx are stable.

## 2023-01-17 NOTE — Assessment & Plan Note (Signed)
Chronic, well controlled. She will continue with current meds including carvedilol, furosemide and hydralazine. Importance of dietary compliance was discussed with the patient.

## 2023-01-24 ENCOUNTER — Telehealth (HOSPITAL_COMMUNITY): Payer: Self-pay

## 2023-01-24 NOTE — Telephone Encounter (Signed)
Patient called in and left message on machine stating that she has a productive cough, in which she has been taking tessalon perles, and robitussin with no relief.   Patient reports issues with swelling and weight gain. Patient reports no swelling in legs, reports recent weight gain up to 179lb this morning, had been previously 169 last week and climbing back up ever since.   Can't lay flat to sleep   Intermittent Chest pain- sharp pain shoots across the chest- denies any at present   Blood pressure most recent- 130/65 and heart rate of 75 most recent o2 sat 95%  Reports she is taking lasix 4 tablets 80mg  in the morning and 3 tablets 60mg  at night   Lightheadedness intermittently   Shortness of breath- all the time. Worse since the cough has started  Phlegm that she coughs up is clear and sometimes thick   Advised patient I would forward message over to provider for review. Patient verbalized understanding.

## 2023-01-24 NOTE — Progress Notes (Unsigned)
[facility] OFFICE PROGRESS NOTE  [name] Peng, MD [address] [place] [address]  DIAGNOSIS: Iron deficiency anemia   PRIOR THERAPY: None  CURRENT THERAPY:  1) oral iron supplement OTC tablet p.o. daily 2) IV iron as needed most recent with Ferrlecit 250 mg on 12/04/2022  INTERVAL HISTORY: [name] 77 y.o. female returns to the clinic today for a follow-up visit. The patient was referred to the clinic and seen to establish care on 10/22/2022.  The patient is followed for her history of iron deficiency anemia.  When she was seen, we arrange for IV iron infusions with Ferrlecit, her most recent being on 12/04/2022.  He is taking over-the-counter iron supplements as Integra plus was too expensive for her.  She was last seen in the clinic by me on 12/13/2022.  He noticed some improvement in her symptoms at her last appointment.  Her fatigue was stable.  She reports some good days and bad days with her fatigue.  She reports similar dyspnea on exertion, recently had cardiac procedure for atrial septal defect performed on 12/26/2022 by her cardiologist.  She tolerated this***  She has a history of GI bleeding and follows with Dr. Dulce Sellar.  She reports she has not seen Dr. Dulce Sellar in the interval since last being seen.  She denies any significant visible gingival bleeding, epistaxis, hemoptysis, hematemesis, or melena.  She reports sometimes her stools are dark but she is not sure if this is attributed to blood or her iron supplement. The patient does take Eliquis.  Denies any NSAID use.  She is not a vegan or vegetarian, although she does not eat a lot of red meat due to preference.  No history of bariatric surgery.  She does follow closely with pulmonary medicine for pulmonary fibrosis and chronic cough.  The patient is here today for evaluation repeat blood work.         MEDICAL HISTORY: Past Medical History:  Diagnosis Date   Abnormal liver function     in  the past.   Anemia    Arthritis    all over   Bruises easily    Cataract    right eye;immature   CHF (congestive heart failure) (HCC)    Chronic back pain    stenosis   Chronic cough    Chronic kidney disease    COPD (chronic obstructive pulmonary disease) (HCC)    COVID    aug/sept 2021   Demand myocardial infarction [facility]) 2012   Demand Infarction in setting of Pancreatitis --> mild Troponin elevation, NON-OBSTRUCTIVE CAD   Depression    takes Abilify daily as well as Zoloft   Diastolic heart failure 2010   Grade 1 diastolic Dysfunction by Echo    Diverticulosis    DM (diabetes mellitus) (HCC)    takes Victoza daily as well as Lantus and Humalog   Empty sella (HCC)    on MRI in 2009.   Glaucoma    Headache(784.0)    last migraine-4-38yrs ago   History of blood transfusion    no abnormal reaction noted   History of colon polyps    benign   HTN (hypertension)    takes Benicar,Imdur,and Bystolic daily   Hyperlipidemia    takes Lipitor daily   Joint swelling    Nocturia    Obesity hypoventilation syndrome (HCC)    Obstructive sleep apnea 02/2018   Notably improved split-night study with weight loss from 270 (2017) down to 250  pounds (2019).   Pancreatitis    takes Pancrelipase daily   Parkinson's disease    takes Sinemet daily   Peripheral neuropathy    Pneumonia 2012   Seizures (HCC)    takes Lamictal daily and Primidone nightly;last seizure 2wks ago   Urinary urgency    With increased frequency   Varicose veins of both lower extremities with pain    With edema.  Takes daily Lasix    ALLERGIES:  is allergic to other, penicillins, sulfa antibiotics, aspirin, and codeine.  MEDICATIONS:  Current Outpatient Medications  Medication Sig Dispense Refill   acetaminophen (TYLENOL) 500 MG tablet Take 500-1,000 mg by mouth every 4 (four) hours as needed for moderate pain.     albuterol (VENTOLIN HFA) 108 (90 Base) MCG/ACT inhaler Inhale 1-2 puffs into the lungs every 6  (six) hours as needed for wheezing or shortness of breath. 18 g 5   Alcaftadine (LASTACAFT) 0.25 % SOLN Place 1-2 drops into both eyes daily as needed (allergy eye/irritation.).     Alcohol Swabs (DROPSAFE ALCOHOL PREP) 70 % PADS USE TWO TIMES DAILY AS DIRECTED 200 each 3   amiodarone (PACERONE) 200 MG tablet Take 1 tablet (200 mg total) by mouth 2 (two) times daily for 14 days, THEN 1 tablet (200 mg total) daily. 90 tablet 3   apixaban (ELIQUIS) 5 MG TABS tablet Take 1 tablet (5 mg total) by mouth 2 (two) times daily. 180 tablet 1   Ascorbic Acid (VITAMIN C) 1000 MG tablet Take 1,000 mg by mouth in the morning.     atorvastatin (LIPITOR) 40 MG tablet Take 1 tablet (40 mg total) by mouth daily. 90 tablet 3   B-D ULTRAFINE III SHORT PEN 31G X 8 MM MISC USE DAILY WITH VICTOZA AND/OR NOVOLOG. 400 each 2   benzonatate (TESSALON) 100 MG capsule TAKE 1 CAPSULE THREE TIMES DAILY AS NEEDED FOR COUGH 30 capsule 1   Blood Glucose Monitoring Suppl (TRUE METRIX METER) w/Device KIT USE AS DIRECTED 1 kit 1   carvedilol (COREG) 12.5 MG tablet TAKE 1 TABLET TWICE DAILY 180 tablet 3   dexlansoprazole (DEXILANT) 60 MG capsule Take 60 mg by mouth daily.     diclofenac sodium (VOLTAREN) 1 % GEL Apply 2 g topically 4 (four) times daily as needed (pain).     FeFum-FePoly-FA-B Cmp-C-Biot (INTEGRA PLUS) CAPS Take 1 capsule by mouth every morning. (Patient not taking: Reported on 01/11/2023) 30 capsule 2   ferrous sulfate 324 MG TBEC Take 324 mg by mouth daily with breakfast.     furosemide (LASIX) 20 MG tablet TAKE 4 TABLETS (80 MG) BY MOUTH IN THE MORNING AND 3 TABLETS (60 MG) EVERY EVENING. (Patient taking differently: Take 60-80 mg by mouth See admin instructions. Take 80 mg in the morning and 60 mg in the evening) 630 tablet 3   Glucagon (GVOKE HYPOPEN 2-PACK) 0.5 MG/0.1ML SOAJ INJECT 0.5 MG (1 PEN) INTO THE SKIN DAILY AS NEEDED. 2 mL 11   guaiFENesin-dextromethorphan (ROBITUSSIN DM) 100-10 MG/5ML syrup Take 10 mLs by  mouth every 6 (six) hours as needed for cough. 118 mL 0   hydrALAZINE (APRESOLINE) 100 MG tablet Take 1 tablet (100 mg total) by mouth 3 (three) times daily. 270 tablet 3   hydrocortisone cream 1 % Apply 1 application topically daily as needed for itching.      insulin glargine, 1 Unit Dial, (TOUJEO) 300 UNIT/ML Solostar Pen Inject 20 Units into the skin at bedtime.     insulin  lispro (HUMALOG KWIKPEN) 100 UNIT/ML KwikPen Inject 0-10 Units into the skin See admin instructions. Sliding scale:Inject  (high blood sugar over 150). 150-199=2 units;200-249=4 units,250-299=6 units;300-349=8 units;350 above 10 units: Over 350 call physician 15 mL 0   Isopropyl Alcohol (ALCOHOL WIPES) 70 % MISC Apply 1 each topically 2 (two) times daily. 300 each 3   isosorbide mononitrate (IMDUR) 30 MG 24 hr tablet Take 1 tablet (30 mg total) by mouth daily. 90 tablet 3   lamoTRIgine (LAMICTAL) 150 MG tablet Take 1 tablet (150 mg total) by mouth 2 (two) times daily. 180 tablet 3   levETIRAcetam (KEPPRA) 500 MG tablet Take 2 tablets every night 180 tablet 3   loratadine (CLARITIN) 10 MG tablet Take 1 tablet (10 mg total) by mouth daily. 30 tablet 2   oxyCODONE (OXY IR/ROXICODONE) 5 MG immediate release tablet Take 5 mg by mouth every 6 (six) hours as needed (pain.).     OZEMPIC, 1 MG/DOSE, 4 MG/3ML SOPN INJECT 1MG  UNDER THE SKIN ONE TIME WEEKLY (Patient taking differently: Inject 1 mg into the skin every Wednesday at 6 PM. INJECT 1MG  UNDER THE SKIN ONE TIME WEEKLY) 9 mL 3   Pancrelipase, Lip-Prot-Amyl, 25000-79000 units CPEP Take 1-2 capsules by mouth See admin instructions. Take 2 capsules in the morning with breakfast, 1 capsule with lunch, and 2 capsules with supper     potassium chloride SA (KLOR-CON M) 20 MEQ tablet Take 2 tablets (40 mEq total) by mouth daily. With additional on metolazone days (Patient taking differently: Take 20-40 mEq by mouth See admin instructions. Take 2 tablets (40 meq) in the morning and take  1 tablet (20 meq) by mouth at night) 300 tablet 3   sennosides-docusate sodium (SENOKOT-S) 8.6-50 MG tablet Take 1-2 tablets by mouth at bedtime as needed for constipation.     sertraline (ZOLOFT) 50 MG tablet TAKE 1 TABLET EVERY DAY (Patient taking differently: Take 50 mg by mouth every evening.) 90 tablet 3   tiZANidine (ZANAFLEX) 2 MG tablet TAKE 1 TABLET AT BEDTIME AS NEEDED 30 tablet 3   tolnaftate (TINACTIN) 1 % cream Apply 1 application topically daily as needed (foot fungus).      TRUE METRIX BLOOD GLUCOSE TEST test strip USE AS DIRECTED TO CHECK BLOOD SUGARS 2 TIMES PER DAY 200 strip 10   TRUEplus Lancets 33G MISC TEST BLOOD SUGAR TWICE DAILY AS DIRECTED 200 each 3   No current facility-administered medications for this visit.   Facility-Administered Medications Ordered in Other Visits  Medication Dose Route Frequency Provider Last Rate Last Admin   0.9 %  sodium chloride infusion (Manually program via Guardrails IV Fluids)   Intravenous Once [place], Anderson Malta, FNP       0.9 %  sodium chloride infusion (Manually program via Guardrails IV Fluids)   Intravenous Once [place], Anderson Malta, FNP        SURGICAL HISTORY:  Past Surgical History:  Procedure Laterality Date   ABDOMINAL HYSTERECTOMY     APPENDECTOMY     ATRIAL SEPTAL DEFECT(ASD) CLOSURE N/A 01/02/2023   Procedure: ATRIAL SEPTAL DEFECT(ASD) CLOSURE;  Surgeon: Tonny Bollman, MD;  Location: [facility] INVASIVE CV LAB;  Service: Cardiovascular;  Laterality: N/A;   BACK SURGERY     Cardiac Event Monitor  September-October 2017   Sinus rhythm with occasional PACs and artifact. No arrhythmias besides one short run of tachycardia.   CARDIOVERSION N/A 08/15/2022   Procedure: CARDIOVERSION;  Surgeon: Laurey Morale, MD;  Location: [facility] ENDOSCOPY;  Service: Cardiovascular;  Laterality: N/A;   CHOLECYSTECTOMY     COLONOSCOPY N/A 08/15/2012   Procedure: COLONOSCOPY;  Surgeon: Willis Modena, MD;  Location: [facility] ENDOSCOPY;  Service: Endoscopy;   Laterality: N/A;   COLONOSCOPY WITH PROPOFOL Left 06/17/2017   Procedure: COLONOSCOPY WITH PROPOFOL;  Surgeon: Kerin Salen, MD;  Location: [facility] ENDOSCOPY;  Service: Gastroenterology;  Laterality: Left;   COLONOSCOPY WITH PROPOFOL N/A 04/03/2021   Procedure: COLONOSCOPY WITH PROPOFOL;  Surgeon: Vida Rigger, MD;  Location: [facility] ENDOSCOPY;  Service: Endoscopy;  Laterality: N/A;   CORONARY CALCIUM SCORE AND CTA  04/2018   Coronary calcium score 71.9.  Very large, hyperdynamic LAD wrapping apex giving rise to PDA.  Moderate LAD-diagonal and circumflex plaque -> CT FFR suggested positive findings in distal D1, D2 and distal circumflex.  Referred for cath. ==> FALSE POSITIVE   ESOPHAGOGASTRODUODENOSCOPY     eye cysts Bilateral    LASIK     LEFT HEART CATH AND CORONARY ANGIOGRAPHY N/A 05/16/2018   Procedure: LEFT HEART CATH AND CORONARY ANGIOGRAPHY;  Surgeon: Lyn Records, MD;  Location: [facility] INVASIVE CV LAB:  Angiographically normal coronary arteries with LVEDP of 21 mmHg.  Large draping hyperdominant LAD that wraps the apex and provides distal half of the PDA.  Relatively small caliber distal Cx -->proxLPDA, non-dom RCA. No Cx or Diag lesions -- FALSE + CT FFR   LEFT HEART CATH AND CORONARY ANGIOGRAPHY  2009/March 2012   2009: (Dr. Janene Harvey) Nonobstructive CAD; 2012: Minimal CAD --> false positive stress test   NM MYOVIEW LTD  01/2016; 01/2018   a)LOW RISK. No ischemia or infarction;; b) EF 55-60%. NO ST changes.  No ischemia or Infarction. NO significant RV enlargement.  LOW RISK.   POLYPECTOMY  04/03/2021   Procedure: POLYPECTOMY;  Surgeon: Vida Rigger, MD;  Location: [facility] ENDOSCOPY;  Service: Endoscopy;;   Polysomnogram  02/2018   (Dr. Vickey Huger from neurology): Split study was not adequately done due to low AHI.  She slept reclined and had an AHI less than 10, prolonged hypoxemia and no hypercapnia. -->  She has home oxygen already prescribed, but did not meet criteria for nightly oxygen.  (Was noted that  the patient did not cooperate well with study.  Titration was discussed.   PRESSURE SENSOR/CARDIOMEMS N/A 11/09/2019   Procedure: PRESSURE SENSOR/CARDIOMEMS;  Surgeon: Laurey Morale, MD;  Location: [facility] INVASIVE CV LAB;  Service: Cardiovascular;  Laterality: N/A;   RECTAL POLYPECTOMY     RIGHT HEART CATH N/A 09/07/2022   Procedure: RIGHT HEART CATH;  Surgeon: Laurey Morale, MD;  Location: [facility] INVASIVE CV LAB;  Service: Cardiovascular;  Laterality: N/A;   RIGHT/LEFT HEART CATH AND CORONARY ANGIOGRAPHY N/A 04/06/2019   Procedure: RIGHT/LEFT HEART CATH AND CORONARY ANGIOGRAPHY;  Surgeon: Marykay Lex, MD;  Location: [facility] INVASIVE CV LAB; angiographically normal coronary arteries.  Moderately elevated AD LVEDP..  Mild pulmonary hypertension: PA P 56/14 mmHg-mean 31 mmHg.  RAP 10 mmHg.  RV P-EDP 54/4 mmHg - 12 mmHg.  PCWP 11-15 mmHg.  CO-CI: 7.35-3.73.   TEE WITHOUT CARDIOVERSION N/A 08/15/2022   Procedure: TRANSESOPHAGEAL ECHOCARDIOGRAM (TEE);  Surgeon: Laurey Morale, MD;  Location: [facility] ENDOSCOPY;  Service: Cardiovascular;  Laterality: N/A;   TONSILLECTOMY     TRANSTHORACIC ECHOCARDIOGRAM  01/2016; 05/2017   A) Normal LV chamber size with mod LVH pattern. EF 50-55%. Severe LA dilation. Mod RA dilation. PAP elevated at 38 mmHg;; B) Mild concentric LVH.  EF 55-60% & no RWMA.  Grade 2 DD.  Diastolic flattening of the ventricular septum == ? elevated PAP.  Mild aortic valve calcification/sclerosis.  Mod LA dilation.  Mild RV dilation & Mod RA dilation   TRANSTHORACIC ECHOCARDIOGRAM  10/2017; 01/2018   a) EF 55-60%.  No RWMA,  Moderate LA dilation.  Mild LA dilation.  Peak PA pressures ~57 mmHg (moderate);;; b) EF 55-60%.  GRII DD.  Suggestion of RV volume overload with moderate RV dilation.  Severe LA and RA dilation.Marland Kitchen  PA pressure ~67%.   TRANSTHORACIC ECHOCARDIOGRAM  01/2019   Normal LV size and function.  EF 50 to 55%.  Impaired relaxation.  RV appears to have moderately reduced function.  Severely  enlarged.  Cannot fully assess RV pressures.  Hypermobile interatrial septum.  Right atrium severely dilated.   TUBAL LIGATION     vaginal cyst removed     several times    REVIEW OF SYSTEMS:   Review of Systems  Constitutional: Negative for appetite change, chills, fatigue, fever and unexpected weight change.  HENT:   Negative for mouth sores, nosebleeds, sore throat and trouble swallowing.   Eyes: Negative for eye problems and icterus.  Respiratory: Negative for cough, hemoptysis, shortness of breath and wheezing.   Cardiovascular: Negative for chest pain and leg swelling.  Gastrointestinal: Negative for abdominal pain, constipation, diarrhea, nausea and vomiting.  Genitourinary: Negative for bladder incontinence, difficulty urinating, dysuria, frequency and hematuria.   Musculoskeletal: Negative for back pain, gait problem, neck pain and neck stiffness.  Skin: Negative for itching and rash.  Neurological: Negative for dizziness, extremity weakness, gait problem, headaches, light-headedness and seizures.  Hematological: Negative for adenopathy. Does not bruise/bleed easily.  Psychiatric/Behavioral: Negative for confusion, depression and sleep disturbance. The patient is not nervous/anxious.     PHYSICAL EXAMINATION:  There were no vitals taken for this visit.  ECOG PERFORMANCE STATUS: {CHL ONC ECOG [id number]  Physical Exam  Constitutional: Oriented to person, place, and time and well-developed, well-nourished, and in no distress. No distress.  HENT:  Head: Normocephalic and atraumatic.  Mouth/Throat: Oropharynx is clear and moist. No oropharyngeal exudate.  Eyes: Conjunctivae are normal. Right eye exhibits no discharge. Left eye exhibits no discharge. No scleral icterus.  Neck: Normal range of motion. Neck supple.  Cardiovascular: Normal rate, regular rhythm, normal heart sounds and intact distal pulses.   Pulmonary/Chest: Effort normal and breath sounds normal. No  respiratory distress. No wheezes. No rales.  Abdominal: Soft. Bowel sounds are normal. Exhibits no distension and no mass. There is no tenderness.  Musculoskeletal: Normal range of motion. Exhibits no edema.  Lymphadenopathy:    No cervical adenopathy.  Neurological: Alert and oriented to person, place, and time. Exhibits normal muscle tone. Gait normal. Coordination normal.  Skin: Skin is warm and dry. No rash noted. Not diaphoretic. No erythema. No pallor.  Psychiatric: Mood, memory and judgment normal.  Vitals reviewed.  LABORATORY DATA: Lab Results  Component Value Date   WBC 4.6 12/26/2022   HGB 9.9 (L) 12/26/2022   HCT 32.4 (L) 12/26/2022   MCV 71 (L) 12/26/2022   PLT 290 12/26/2022      Chemistry      Component Value Date/Time   NA 138 01/11/2023 1549   NA 140 12/26/2022 1107   K 3.5 01/11/2023 1549   CL 96 (L) 01/11/2023 1549   CO2 28 01/11/2023 1549   BUN 17 01/11/2023 1549   BUN 13 12/26/2022 1107   CREATININE 0.90 01/11/2023 1549   CREATININE 0.95 10/22/2022 1323   CREATININE 0.91  10/04/2016 1144      Component Value Date/Time   CALCIUM 9.2 01/11/2023 1549   ALKPHOS 112 10/22/2022 1323   AST 19 10/22/2022 1323   ALT 16 10/22/2022 1323   BILITOT 0.3 10/22/2022 1323       RADIOGRAPHIC STUDIES:  ECHOCARDIOGRAM LIMITED  Result Date: 01/02/2023    ECHOCARDIOGRAM LIMITED REPORT   Patient Name:   [name] Date of Exam: 01/02/2023 Medical Rec #:  [id number]       Height:       61.0 in Accession #:    [id number]      Weight:       174.0 lb Date of Birth:  05-11-1946      BSA:          1.780 m Patient Age:    76 years        BP:           134/98 mmHg Patient Gender: F               HR:           63 bpm. Exam Location:  Inpatient Procedure: Limited Echo and Limited Color Doppler Indications:    Atrial Septal Defect Q21.1  History:        Patient has prior history of Echocardiogram examinations, most                 recent 12/24/2022. CHF; Risk Factors:Dyslipidemia,  Diabetes,                 Hypertension, Former Smoker and Sleep Apnea. History of                 percutaneous transcatheter closure of congenital ASD.  Sonographer:    Dondra Prader RVT RCS Referring Phys: [phone] JILL D MCDANIEL IMPRESSIONS  1. Left ventricular ejection fraction, by estimation, is 55 to 60%. The left ventricle has normal function.  2. Right ventricular systolic function is moderately reduced. The right ventricular size is moderately enlarged.  3. Left atrial size was severely dilated.  4. There is an atrial septal closure device in place. No residual shunting seen.  5. Right atrial size was severely dilated.  6. Limited study to evaluate ASD closure. FINDINGS  Left Ventricle: Left ventricular ejection fraction, by estimation, is 55 to 60%. The left ventricle has normal function. Right Ventricle: The right ventricular size is moderately enlarged. Right ventricular systolic function is moderately reduced. Left Atrium: Left atrial size was severely dilated. Right Atrium: Right atrial size was severely dilated. Mitral Valve: Trivial mitral valve regurgitation. Tricuspid Valve: The tricuspid valve is normal in structure. Tricuspid valve regurgitation is mild. IVC IVC diam: 3.10 cm Arvilla Meres MD Electronically signed by Arvilla Meres MD Signature Date/Time: 01/02/2023/7:09:35 PM    Final    CARDIAC CATHETERIZATION  Result Date: 01/02/2023 Successful transcatheter ASD closure using a 16 mm Amplatzer septal occluder device with intracardiac echo and fluoroscopic guidance Recommend: Resume apixaban tonight No antiplatelet therapy due to bleeding risk with chronic iron deficiency anemia Overnight observation and discharge home tomorrow morning after limited 2D echocardiogram      ASSESSMENT/PLAN:  This is a very pleasant 77 year old African-American female referred to the clinic for iron deficiency anemia.   The patient is currently taking oral iron supplements with OTC iron p.o. daily and  she is compliant with this.  She also receives IV iron as needed most recent being with Ferrlecit 250 mg on 12/04/2022.   This  patient had several lab studies performed today including a CBC, CMP, iron studies, ferritin, SPEP, B12, folate, and sample blood bank.  The patients labs from today demonstrated anemia with a hemoglobin of 10.1.  Her other lab studies are pending.   Will wait for her iron studies to see if she requires any additional IV iron.   Either way, we will arrange for labs and follow-up visit in approximately 6 weeks.  She reports she sees her PCP in about 2 and half to 3 weeks we will likely recheck her labs at that time.     ***To her knowledge, she does not have any upcoming appointments with GI regarding her history of GI bleeding.  Discussed it may be a good idea to follow-up with them for close monitoring.  She does intermittently have dark stools but she is not sure if that is attributed to intermittent GI bleeding or her iron supplement.  She did have stool cards performed at her PCPs office a few months ago in February which were positive for blood and we recommended follow up with GI at her last appointment. Reinforced this today. ***   The patient was advised to call immediately if she has any concerning symptoms in the interval. The patient voices understanding of current disease status and treatment options and is in agreement with the current care plan. All questions were answered. The patient knows to call the clinic with any problems, questions or concerns. We can certainly see the patient much sooner if necessary  No orders of the defined types were placed in this encounter.    I spent {CHL ONC TIME VISIT - AOZHY:[id number]} counseling the patient face to face. The total time spent in the appointment was {CHL ONC TIME VISIT - XBMWU:[id number]}.   L , PA-C 01/24/23

## 2023-01-25 MED ORDER — FUROSEMIDE 20 MG PO TABS: 80.0000 mg | ORAL_TABLET | Freq: Two times a day (BID) | ORAL | 3 refills | Status: DC

## 2023-01-25 NOTE — Telephone Encounter (Signed)
[name] Lis M, PA-C  You18 hours ago (2:26 PM)    Increase Lasix to 80 mg bid    Called and spoke with patients sister- advised her of the above. New prescription sent to pharmacy. Advised her to have patient keep an eye on her weight, symptoms, cough and swelling and to call back with concerns or issues. Patients sister verbalized understanding.

## 2023-01-27 NOTE — Pre-Procedure Instructions (Addendum)
Attempted to call patient regarding procedure scheduled for Monday 02/04/23 with anesthesia.  Anesthesia requires certain medications to be held for 7 days prior to procedure.  Ozempic-  don't take any after today, you can resume it after your procedure on 02/04/23.  Left voicemail with the above details.

## 2023-01-28 ENCOUNTER — Inpatient Hospital Stay: Payer: Medicare HMO | Admitting: Physician Assistant

## 2023-01-28 ENCOUNTER — Other Ambulatory Visit: Payer: Self-pay

## 2023-01-28 ENCOUNTER — Inpatient Hospital Stay: Payer: Medicare HMO | Attending: Physician Assistant

## 2023-01-28 VITALS — BP 129/69 | HR 61 | Temp 97.7°F | Resp 18 | Ht 61.0 in | Wt 180.6 lb

## 2023-01-28 DIAGNOSIS — K922 Gastrointestinal hemorrhage, unspecified: Secondary | ICD-10-CM | POA: Insufficient documentation

## 2023-01-28 DIAGNOSIS — D509 Iron deficiency anemia, unspecified: Secondary | ICD-10-CM | POA: Insufficient documentation

## 2023-01-28 DIAGNOSIS — D62 Acute posthemorrhagic anemia: Secondary | ICD-10-CM

## 2023-01-28 DIAGNOSIS — D500 Iron deficiency anemia secondary to blood loss (chronic): Secondary | ICD-10-CM

## 2023-01-28 DIAGNOSIS — Z79899 Other long term (current) drug therapy: Secondary | ICD-10-CM | POA: Diagnosis not present

## 2023-01-28 LAB — SAMPLE TO BLOOD BANK

## 2023-01-28 LAB — CBC WITH DIFFERENTIAL (CANCER CENTER ONLY)
Abs Immature Granulocytes: 0.01 10*3/uL (ref 0.00–0.07)
Basophils Absolute: 0 10*3/uL (ref 0.0–0.1)
Basophils Relative: 1 %
Eosinophils Absolute: 0.2 10*3/uL (ref 0.0–0.5)
Eosinophils Relative: 6 %
HCT: 29.9 % — ABNORMAL LOW (ref 36.0–46.0)
Hemoglobin: 9.5 g/dL — ABNORMAL LOW (ref 12.0–15.0)
Immature Granulocytes: 0 %
Lymphocytes Relative: 30 %
Lymphs Abs: 1.3 10*3/uL (ref 0.7–4.0)
MCH: 23.3 pg — ABNORMAL LOW (ref 26.0–34.0)
MCHC: 31.8 g/dL (ref 30.0–36.0)
MCV: 73.3 fL — ABNORMAL LOW (ref 80.0–100.0)
Monocytes Absolute: 0.4 10*3/uL (ref 0.1–1.0)
Monocytes Relative: 10 %
Neutro Abs: 2.3 10*3/uL (ref 1.7–7.7)
Neutrophils Relative %: 53 %
Platelet Count: 319 10*3/uL (ref 150–400)
RBC: 4.08 MIL/uL (ref 3.87–5.11)
RDW: 20 % — ABNORMAL HIGH (ref 11.5–15.5)
WBC Count: 4.2 10*3/uL (ref 4.0–10.5)
nRBC: 0 % (ref 0.0–0.2)

## 2023-01-28 LAB — IRON AND IRON BINDING CAPACITY (CC-WL,HP ONLY)
Iron: 40 ug/dL (ref 28–170)
Saturation Ratios: 12 % (ref 10.4–31.8)
TIBC: 332 ug/dL (ref 250–450)
UIBC: 292 ug/dL (ref 148–442)

## 2023-01-30 ENCOUNTER — Other Ambulatory Visit: Payer: Self-pay | Admitting: Physician Assistant

## 2023-01-30 NOTE — Progress Notes (Signed)
Reviewed the patient's iron studies from 01/28/23 with Dr. Arbutus Ped. He does not feel she needs IV iron infusions at this time. I will cancel the order and we will call her to let her know. Dr. Arbutus Ped would like her to follow up with GI as she was instructed during her appointment. She will continue her OTC iron supplement and we will recheck her labs in 2 months.

## 2023-01-31 ENCOUNTER — Telehealth: Payer: Self-pay

## 2023-01-31 NOTE — Telephone Encounter (Signed)
This nurse reached out to patient and made her aware that her lab results show that her iron levels are good.  However, Dr. Arbutus Ped He does NOT think you need IV iron. However, your Hbg is going down. He really wants patient to see Dr. Dulce Sellar from GI to see if he needs to check again for bleeding. Advised that her iron infusions have been cancelled. Continue the oral iron. This nurse reinforced that Dr. Arbutus Ped really wants her to see Dr. Dulce Sellar so please call to make an appointment, and he will follow up in 2 months.   Patient acknowledged understanding and stated that she will call today to make an appointment.  No further questions or concerns noted at this time.

## 2023-02-01 NOTE — Pre-Procedure Instructions (Signed)
Spoke to patient on phone regarding procedure Monday.  Instructed to arrive at 0815 am , NPO after midnight.  Confirmed patient has a ride home and responsible person to stay with patient for 24 hours after the procedure  Confirmed no missed doses of  Eliquis, instructed patient to take the morning of surgery with a sip of water. Stop Ozempic for a week (patient takes on Wednesdays and last dose was 01/23/23.  Take Amiodarone, Carvedilol, Hydralazine, isosorbide, keppra meds in the AM with a sip of water. She may also do her eye drops morning of procedure.

## 2023-02-04 ENCOUNTER — Other Ambulatory Visit: Payer: Self-pay

## 2023-02-04 ENCOUNTER — Ambulatory Visit (HOSPITAL_BASED_OUTPATIENT_CLINIC_OR_DEPARTMENT_OTHER): Payer: Medicare HMO | Admitting: Anesthesiology

## 2023-02-04 ENCOUNTER — Encounter (HOSPITAL_COMMUNITY)
Admission: RE | Disposition: A | Discharge status: Home / Self Care | Payer: Self-pay | Source: Ambulatory Visit | Attending: Cardiology

## 2023-02-04 ENCOUNTER — Ambulatory Visit (HOSPITAL_COMMUNITY): Payer: Medicare HMO | Admitting: Anesthesiology

## 2023-02-04 ENCOUNTER — Ambulatory Visit (HOSPITAL_COMMUNITY)
Admission: RE | Admit: 2023-02-04 | Discharge: 2023-02-04 | Disposition: A | Discharge status: Home / Self Care | Payer: Medicare HMO | Source: Ambulatory Visit | Attending: Cardiology | Admitting: Cardiology

## 2023-02-04 DIAGNOSIS — I484 Atypical atrial flutter: Secondary | ICD-10-CM

## 2023-02-04 DIAGNOSIS — I132 Hypertensive heart and chronic kidney disease with heart failure and with stage 5 chronic kidney disease, or end stage renal disease: Secondary | ICD-10-CM

## 2023-02-04 DIAGNOSIS — G4733 Obstructive sleep apnea (adult) (pediatric): Secondary | ICD-10-CM | POA: Diagnosis not present

## 2023-02-04 DIAGNOSIS — E669 Obesity, unspecified: Secondary | ICD-10-CM | POA: Insufficient documentation

## 2023-02-04 DIAGNOSIS — D509 Iron deficiency anemia, unspecified: Secondary | ICD-10-CM | POA: Insufficient documentation

## 2023-02-04 DIAGNOSIS — Z7901 Long term (current) use of anticoagulants: Secondary | ICD-10-CM | POA: Diagnosis not present

## 2023-02-04 DIAGNOSIS — Z6833 Body mass index (BMI) 33.0-33.9, adult: Secondary | ICD-10-CM | POA: Insufficient documentation

## 2023-02-04 DIAGNOSIS — Z79899 Other long term (current) drug therapy: Secondary | ICD-10-CM | POA: Diagnosis not present

## 2023-02-04 DIAGNOSIS — R5383 Other fatigue: Secondary | ICD-10-CM | POA: Insufficient documentation

## 2023-02-04 DIAGNOSIS — E785 Hyperlipidemia, unspecified: Secondary | ICD-10-CM | POA: Diagnosis not present

## 2023-02-04 DIAGNOSIS — I5032 Chronic diastolic (congestive) heart failure: Secondary | ICD-10-CM | POA: Diagnosis not present

## 2023-02-04 DIAGNOSIS — E1122 Type 2 diabetes mellitus with diabetic chronic kidney disease: Secondary | ICD-10-CM

## 2023-02-04 DIAGNOSIS — Z8774 Personal history of (corrected) congenital malformations of heart and circulatory system: Secondary | ICD-10-CM | POA: Diagnosis not present

## 2023-02-04 DIAGNOSIS — J449 Chronic obstructive pulmonary disease, unspecified: Secondary | ICD-10-CM | POA: Diagnosis not present

## 2023-02-04 DIAGNOSIS — I272 Pulmonary hypertension, unspecified: Secondary | ICD-10-CM | POA: Insufficient documentation

## 2023-02-04 DIAGNOSIS — Z87891 Personal history of nicotine dependence: Secondary | ICD-10-CM | POA: Diagnosis not present

## 2023-02-04 DIAGNOSIS — N182 Chronic kidney disease, stage 2 (mild): Secondary | ICD-10-CM

## 2023-02-04 DIAGNOSIS — I110 Hypertensive heart disease with heart failure: Secondary | ICD-10-CM | POA: Insufficient documentation

## 2023-02-04 DIAGNOSIS — I4892 Unspecified atrial flutter: Secondary | ICD-10-CM | POA: Diagnosis not present

## 2023-02-04 HISTORY — PX: CARDIOVERSION: SHX1299

## 2023-02-04 SURGERY — CARDIOVERSION
Anesthesia: General

## 2023-02-04 MED ORDER — SODIUM CHLORIDE 0.9 % IV SOLN: INTRAVENOUS | Status: DC

## 2023-02-04 MED ORDER — LIDOCAINE 2% (20 MG/ML) 5 ML SYRINGE
INTRAMUSCULAR | Status: DC | PRN
  Administered 2023-02-04: 60 mg via INTRAVENOUS

## 2023-02-04 MED ORDER — PROPOFOL 10 MG/ML IV BOLUS
INTRAVENOUS | Status: DC | PRN
  Administered 2023-02-04: 50 mg via INTRAVENOUS

## 2023-02-04 SURGICAL SUPPLY — 1 items: ELECT DEFIB PAD ADLT CADENCE (PAD) ×1 IMPLANT

## 2023-02-04 NOTE — Interval H&P Note (Signed)
History and Physical Interval Note:  02/04/2023 9:43 AM  [name]  has presented today for surgery, with the diagnosis of AFLUTTER.  The various methods of treatment have been discussed with the patient and family. After consideration of risks, benefits and other options for treatment, the patient has consented to  Procedure(s): CARDIOVERSION (N/A) as a surgical intervention.  The patient's history has been reviewed, patient examined, no change in status, stable for surgery.  I have reviewed the patient's chart and labs.  Questions were answered to the patient's satisfaction.      [employer]

## 2023-02-04 NOTE — Procedures (Signed)
Electrical Cardioversion Procedure Note [name] [id number] June 19, 1946  Procedure: Electrical Cardioversion Indications:  Atrial Flutter  Procedure Details Consent: Risks of procedure as well as the alternatives and risks of each were explained to the (patient/caregiver).  Consent for procedure obtained. Time Out: Verified patient identification, verified procedure, site/side was marked, verified correct patient position, special equipment/implants available, medications/allergies/relevent history reviewed, required imaging and test results available.  Performed  Patient placed on cardiac monitor, pulse oximetry, supplemental oxygen as necessary.  Sedation given:  Propofol per anesthesiology Pacer pads placed anterior and posterior chest.  Cardioverted 1 time(s).  Cardioverted at 150J.  Evaluation Findings: Post procedure EKG shows: NSR Complications: None Patient did tolerate procedure well.   Marca Ancona 02/04/2023, 9:56 AM

## 2023-02-04 NOTE — Transfer of Care (Signed)
Immediate Anesthesia Transfer of Care Note  Patient: [name]  Procedure(s) Performed: CARDIOVERSION  Patient Location: Cath Lab  Anesthesia Type:MAC  Level of Consciousness: sedated and responds to stimulation  Airway & Oxygen Therapy: Patient connected to nasal cannula oxygen  Post-op Assessment: Report given to RN, Post -op Vital signs reviewed and stable, and Patient moving all extremities  Post vital signs: Reviewed and stable  Last Vitals:  Vitals Value Taken Time  BP    Temp    Pulse    Resp    SpO2      Last Pain: There were no vitals filed for this visit.       Complications: No notable events documented.

## 2023-02-04 NOTE — Anesthesia Preprocedure Evaluation (Signed)
Anesthesia Evaluation  Patient identified by MRN, date of birth, ID band Patient awake    Reviewed: Allergy & Precautions, H&P , NPO status , Patient's Chart, lab work & pertinent test results  Airway Mallampati: II   Neck ROM: full    Dental   Pulmonary sleep apnea , COPD, former smoker   breath sounds clear to auscultation       Cardiovascular hypertension, + CAD, + Past MI and +CHF  + dysrhythmias Atrial Fibrillation  Rhythm:irregular Rate:Normal     Neuro/Psych  Headaches, Seizures -,  PSYCHIATRIC DISORDERS  Depression     Neuromuscular disease    GI/Hepatic ,GERD  ,,  Endo/Other  diabetes, Type 2    Renal/GU      Musculoskeletal  (+) Arthritis ,    Abdominal   Peds  Hematology   Anesthesia Other Findings   Reproductive/Obstetrics                             Anesthesia Physical Anesthesia Plan  ASA: 3  Anesthesia Plan: General   Post-op Pain Management:    Induction: Intravenous  PONV Risk Score and Plan: 3 and Propofol infusion and Treatment may vary due to age or medical condition  Airway Management Planned: Nasal Cannula  Additional Equipment:   Intra-op Plan:   Post-operative Plan:   Informed Consent: I have reviewed the patients History and Physical, chart, labs and discussed the procedure including the risks, benefits and alternatives for the proposed anesthesia with the patient or authorized representative who has indicated his/her understanding and acceptance.     Dental advisory given  Plan Discussed with: CRNA, Anesthesiologist and Surgeon  Anesthesia Plan Comments:        Anesthesia Quick Evaluation

## 2023-02-05 ENCOUNTER — Encounter (HOSPITAL_COMMUNITY): Payer: Self-pay | Admitting: Cardiology

## 2023-02-05 NOTE — Anesthesia Postprocedure Evaluation (Signed)
Anesthesia Post Note  Patient: [name]  Procedure(s) Performed: CARDIOVERSION     Patient location during evaluation: Cath Lab Anesthesia Type: General Level of consciousness: awake and alert Pain management: pain level controlled Vital Signs Assessment: post-procedure vital signs reviewed and stable Respiratory status: spontaneous breathing, nonlabored ventilation, respiratory function stable and patient connected to nasal cannula oxygen Cardiovascular status: blood pressure returned to baseline and stable Postop Assessment: no apparent nausea or vomiting Anesthetic complications: no   No notable events documented.  Last Vitals:  Vitals:   02/04/23 1015 02/04/23 1020  BP: 123/72 121/69  Pulse: (!) 51 (!) 50  Resp: 15 16  Temp:    SpO2: 97% 98%    Last Pain:  Vitals:   02/04/23 1010  TempSrc: Temporal  PainSc: 0-No pain                 , S

## 2023-02-05 NOTE — Progress Notes (Unsigned)
[facility]   MULTIDISCIPLINARY HEART VALVE CLINIC                                     Cardiology Office Note:    Date:  02/06/2023   ID:  CAM MOSCHETTO, DOB 10/26/1945, MRN [id number]  PCP:  [name], MD  [facility] Cardiologist:  Bryan Lemma, MD  [facility] Electrophysiologist:  None   Referring MD: [name], MD   Chief Complaint  Patient presents with   Follow-up    F/u ASD closure   History of Present Illness:    [name] is a 77 y.o. female with a hx of pulmonary HTN, OSA, hx of GI bleeding with chronic anemia, hx of CVA, seizure d/o, atrial flutter on Eliquis, chronic diastolic CHF followed by AHF team, and ASD who is now s/p closure with Dr. Excell Seltzer.    [name] is followed closely by Dr. Shirlee Latch for her cardiology care. She was recently diagnosed (07/2022) with atrial flutter and underwent a TEE/DCCV. At that time she was found to have an atrial septal defect with continuous left-to-right flow. She underwent cardioversion and remains on apixaban. She then underwent right heart catheterization to evaluate for hemodynamic significance of her atrial septal defect. This demonstrated a mean RA pressure of 6, pulmonary pressure 57/12 with a mean of 31, and a pulmonary capillary wedge pressure of 13. There was a significant step up from the SVC to the PA with oxygen saturations of 64% and 77%, respectively. The patient's QP/QS is 1.5/1.    She was seen in structural heart consultation by Dr. Excell Seltzer 10/08/22 and was found to be a good candidate for ASD closure if her Hb could remain >8mg /dL however she wished to discuss proceeding with her family prior to scheduling. After deciding to proceed, she was seen for pre-procedure OV and found to be in AF once again. Plan was to continue with procedure and repeat cardioversion if needed.   She is now s/p successful ASD closure with 16mm Amplatzer septal occluder device without complication. Post op  limited echo with EF 55%, moderate RV enlargement/dysfunction. She was restarted on Eliquis monotherapy and ASA was discontinued.   Today she is here alone but her sister is on the phone. She reports that she continues to have an ongoing cough that has been present for several months now. She almost continually is clearing her through during our conversation and will cough to clear on occasion. This is concerning to her. She underwent DCCV with Dr. Shirlee Latch 8/12 and EKG today shows sinu bradycardia. Echo today looks great with no evidence of interseptal shunting. Otherwise she has no complaints of chest pain, SOB, palpitations, LE edema, orthopnea, PND, dizziness, or syncope. Denies bleeding in stool or urine.     Past Medical History:  Diagnosis Date   Abnormal liver function     in the past.   Anemia    Arthritis    all over   Bruises easily    Cataract    right eye;immature   CHF (congestive heart failure) (HCC)    Chronic back pain    stenosis   Chronic cough    Chronic kidney disease    COPD (chronic obstructive pulmonary disease) (HCC)    COVID    aug/sept 2021   Demand myocardial infarction [facility]) 2012   Demand Infarction in setting of Pancreatitis -->  mild Troponin elevation, NON-OBSTRUCTIVE CAD   Depression    takes Abilify daily as well as Zoloft   Diastolic heart failure 2010   Grade 1 diastolic Dysfunction by Echo    Diverticulosis    DM (diabetes mellitus) (HCC)    takes Victoza daily as well as Lantus and Humalog   Empty sella (HCC)    on MRI in 2009.   Glaucoma    Headache(784.0)    last migraine-4-32yrs ago   History of blood transfusion    no abnormal reaction noted   History of colon polyps    benign   HTN (hypertension)    takes Benicar,Imdur,and Bystolic daily   Hyperlipidemia    takes Lipitor daily   Joint swelling    Nocturia    Obesity hypoventilation syndrome (HCC)    Obstructive sleep apnea 02/2018   Notably improved split-night study with weight  loss from 270 (2017) down to 250 pounds (2019).   Pancreatitis    takes Pancrelipase daily   Parkinson's disease    takes Sinemet daily   Peripheral neuropathy    Pneumonia 2012   Seizures (HCC)    takes Lamictal daily and Primidone nightly;last seizure 2wks ago   Urinary urgency    With increased frequency   Varicose veins of both lower extremities with pain    With edema.  Takes daily Lasix    Past Surgical History:  Procedure Laterality Date   ABDOMINAL HYSTERECTOMY     APPENDECTOMY     ATRIAL SEPTAL DEFECT(ASD) CLOSURE N/A 01/02/2023   Procedure: ATRIAL SEPTAL DEFECT(ASD) CLOSURE;  Surgeon: Tonny Bollman, MD;  Location: [facility] INVASIVE CV LAB;  Service: Cardiovascular;  Laterality: N/A;   BACK SURGERY     Cardiac Event Monitor  September-October 2017   Sinus rhythm with occasional PACs and artifact. No arrhythmias besides one short run of tachycardia.   CARDIOVERSION N/A 08/15/2022   Procedure: CARDIOVERSION;  Surgeon: Laurey Morale, MD;  Location: [facility] ENDOSCOPY;  Service: Cardiovascular;  Laterality: N/A;   CARDIOVERSION N/A 02/04/2023   Procedure: CARDIOVERSION;  Surgeon: Laurey Morale, MD;  Location: [facility] INVASIVE CV LAB;  Service: Cardiovascular;  Laterality: N/A;   CHOLECYSTECTOMY     COLONOSCOPY N/A 08/15/2012   Procedure: COLONOSCOPY;  Surgeon: Willis Modena, MD;  Location: [facility] ENDOSCOPY;  Service: Endoscopy;  Laterality: N/A;   COLONOSCOPY WITH PROPOFOL Left 06/17/2017   Procedure: COLONOSCOPY WITH PROPOFOL;  Surgeon: Kerin Salen, MD;  Location: [facility] ENDOSCOPY;  Service: Gastroenterology;  Laterality: Left;   COLONOSCOPY WITH PROPOFOL N/A 04/03/2021   Procedure: COLONOSCOPY WITH PROPOFOL;  Surgeon: Vida Rigger, MD;  Location: [facility] ENDOSCOPY;  Service: Endoscopy;  Laterality: N/A;   CORONARY CALCIUM SCORE AND CTA  04/2018   Coronary calcium score 71.9.  Very large, hyperdynamic LAD wrapping apex giving rise to PDA.  Moderate LAD-diagonal and circumflex plaque -> CT FFR  suggested positive findings in distal D1, D2 and distal circumflex.  Referred for cath. ==> FALSE POSITIVE   ESOPHAGOGASTRODUODENOSCOPY     eye cysts Bilateral    LASIK     LEFT HEART CATH AND CORONARY ANGIOGRAPHY N/A 05/16/2018   Procedure: LEFT HEART CATH AND CORONARY ANGIOGRAPHY;  Surgeon: Lyn Records, MD;  Location: [facility] INVASIVE CV LAB:  Angiographically normal coronary arteries with LVEDP of 21 mmHg.  Large draping hyperdominant LAD that wraps the apex and provides distal half of the PDA.  Relatively small caliber distal Cx -->proxLPDA, non-dom RCA. No Cx or Diag lesions -- FALSE +  CT [facility]   LEFT HEART CATH AND CORONARY ANGIOGRAPHY  2009/March 2012   2009: (Dr. Janene Harvey) Nonobstructive CAD; 2012: Minimal CAD --> false positive stress test   NM MYOVIEW LTD  01/2016; 01/2018   a)LOW RISK. No ischemia or infarction;; b) EF 55-60%. NO ST changes.  No ischemia or Infarction. NO significant RV enlargement.  LOW RISK.   POLYPECTOMY  04/03/2021   Procedure: POLYPECTOMY;  Surgeon: Vida Rigger, MD;  Location: [facility] ENDOSCOPY;  Service: Endoscopy;;   Polysomnogram  02/2018   (Dr. Vickey Huger from neurology): Split study was not adequately done due to low AHI.  She slept reclined and had an AHI less than 10, prolonged hypoxemia and no hypercapnia. -->  She has home oxygen already prescribed, but did not meet criteria for nightly oxygen.  (Was noted that the patient did not cooperate well with study.  Titration was discussed.   PRESSURE SENSOR/CARDIOMEMS N/A 11/09/2019   Procedure: PRESSURE SENSOR/CARDIOMEMS;  Surgeon: Laurey Morale, MD;  Location: [facility] INVASIVE CV LAB;  Service: Cardiovascular;  Laterality: N/A;   RECTAL POLYPECTOMY     RIGHT HEART CATH N/A 09/07/2022   Procedure: RIGHT HEART CATH;  Surgeon: Laurey Morale, MD;  Location: [facility] INVASIVE CV LAB;  Service: Cardiovascular;  Laterality: N/A;   RIGHT/LEFT HEART CATH AND CORONARY ANGIOGRAPHY N/A 04/06/2019   Procedure: RIGHT/LEFT HEART CATH AND  CORONARY ANGIOGRAPHY;  Surgeon: Marykay Lex, MD;  Location: [facility] INVASIVE CV LAB; angiographically normal coronary arteries.  Moderately elevated AD LVEDP..  Mild pulmonary hypertension: PA P 56/14 mmHg-mean 31 mmHg.  RAP 10 mmHg.  RV P-EDP 54/4 mmHg - 12 mmHg.  PCWP 11-15 mmHg.  CO-CI: 7.35-3.73.   TEE WITHOUT CARDIOVERSION N/A 08/15/2022   Procedure: TRANSESOPHAGEAL ECHOCARDIOGRAM (TEE);  Surgeon: Laurey Morale, MD;  Location: [facility] ENDOSCOPY;  Service: Cardiovascular;  Laterality: N/A;   TONSILLECTOMY     TRANSTHORACIC ECHOCARDIOGRAM  01/2016; 05/2017   A) Normal LV chamber size with mod LVH pattern. EF 50-55%. Severe LA dilation. Mod RA dilation. PAP elevated at 38 mmHg;; B) Mild concentric LVH.  EF 55-60% & no RWMA.  Grade 2 DD.  Diastolic flattening of the ventricular septum == ? elevated PAP.  Mild aortic valve calcification/sclerosis.  Mod LA dilation.  Mild RV dilation & Mod RA dilation   TRANSTHORACIC ECHOCARDIOGRAM  10/2017; 01/2018   a) EF 55-60%.  No RWMA,  Moderate LA dilation.  Mild LA dilation.  Peak PA pressures ~57 mmHg (moderate);;; b) EF 55-60%.  GRII DD.  Suggestion of RV volume overload with moderate RV dilation.  Severe LA and RA dilation.Marland Kitchen  PA pressure ~67%.   TRANSTHORACIC ECHOCARDIOGRAM  01/2019   Normal LV size and function.  EF 50 to 55%.  Impaired relaxation.  RV appears to have moderately reduced function.  Severely enlarged.  Cannot fully assess RV pressures.  Hypermobile interatrial septum.  Right atrium severely dilated.   TUBAL LIGATION     vaginal cyst removed     several times    Current Medications: Current Meds  Medication Sig   acetaminophen (TYLENOL) 500 MG tablet Take 500-1,000 mg by mouth every 4 (four) hours as needed for moderate pain or mild pain.   albuterol (VENTOLIN HFA) 108 (90 Base) MCG/ACT inhaler Inhale 1-2 puffs into the lungs every 6 (six) hours as needed for wheezing or shortness of breath.   Alcaftadine (LASTACAFT) 0.25 % SOLN Place 1-2  drops into both eyes daily as needed (allergy eye/irritation.).   Alcohol Swabs (  DROPSAFE ALCOHOL PREP) 70 % PADS USE TWO TIMES DAILY AS DIRECTED   amiodarone (PACERONE) 200 MG tablet Take 1 tablet (200 mg total) by mouth 2 (two) times daily for 14 days, THEN 1 tablet (200 mg total) daily.   apixaban (ELIQUIS) 5 MG TABS tablet Take 1 tablet (5 mg total) by mouth 2 (two) times daily.   Ascorbic Acid (VITAMIN C) 1000 MG tablet Take 1,000 mg by mouth in the morning.   atorvastatin (LIPITOR) 40 MG tablet Take 1 tablet (40 mg total) by mouth daily.   B-D ULTRAFINE III SHORT PEN 31G X 8 MM MISC USE DAILY WITH VICTOZA AND/OR NOVOLOG.   benzonatate (TESSALON) 100 MG capsule TAKE 1 CAPSULE THREE TIMES DAILY AS NEEDED FOR COUGH   Blood Glucose Monitoring Suppl (TRUE METRIX METER) w/Device KIT USE AS DIRECTED   carvedilol (COREG) 12.5 MG tablet TAKE 1 TABLET TWICE DAILY   diclofenac sodium (VOLTAREN) 1 % GEL Apply 2 g topically 4 (four) times daily as needed (pain).   FeFum-FePoly-FA-B Cmp-C-Biot (INTEGRA PLUS) CAPS Take 1 capsule by mouth every morning.   ferrous sulfate 324 MG TBEC Take 324 mg by mouth daily with breakfast.   furosemide (LASIX) 20 MG tablet Take 4 tablets (80 mg total) by mouth 2 (two) times daily.   Glucagon (GVOKE HYPOPEN 2-PACK) 0.5 MG/0.1ML SOAJ INJECT 0.5 MG (1 PEN) INTO THE SKIN DAILY AS NEEDED.   guaiFENesin-dextromethorphan (ROBITUSSIN DM) 100-10 MG/5ML syrup Take 10 mLs by mouth every 6 (six) hours as needed for cough.   hydrALAZINE (APRESOLINE) 100 MG tablet Take 1 tablet (100 mg total) by mouth 3 (three) times daily.   hydrocortisone cream 1 % Apply 1 application topically daily as needed for itching.    insulin glargine, 1 Unit Dial, (TOUJEO) 300 UNIT/ML Solostar Pen Inject 20 Units into the skin at bedtime.   insulin lispro (HUMALOG KWIKPEN) 100 UNIT/ML KwikPen Inject 0-10 Units into the skin See admin instructions. Sliding scale:Inject  (high blood sugar over 150). 150-199=2  units;200-249=4 units,250-299=6 units;300-349=8 units;350 above 10 units: Over 350 call physician   Isopropyl Alcohol (ALCOHOL WIPES) 70 % MISC Apply 1 each topically 2 (two) times daily.   isosorbide mononitrate (IMDUR) 30 MG 24 hr tablet Take 1 tablet (30 mg total) by mouth daily.   lamoTRIgine (LAMICTAL) 150 MG tablet Take 1 tablet (150 mg total) by mouth 2 (two) times daily.   levETIRAcetam (KEPPRA) 500 MG tablet Take 2 tablets every night   loratadine (CLARITIN) 10 MG tablet Take 1 tablet (10 mg total) by mouth daily.   oxyCODONE (OXY IR/ROXICODONE) 5 MG immediate release tablet Take 5 mg by mouth every 6 (six) hours as needed (pain.).   OZEMPIC, 1 MG/DOSE, 4 MG/3ML SOPN INJECT 1MG  UNDER THE SKIN ONE TIME WEEKLY   Pancrelipase, Lip-Prot-Amyl, 25000-79000 units CPEP Take 2 capsules by mouth in the morning, at noon, and at bedtime.   potassium chloride SA (KLOR-CON M) 20 MEQ tablet Take 2 tablets (40 mEq total) by mouth daily. With additional on metolazone days   sennosides-docusate sodium (SENOKOT-S) 8.6-50 MG tablet Take 1-2 tablets by mouth at bedtime as needed for constipation.   sertraline (ZOLOFT) 50 MG tablet TAKE 1 TABLET EVERY DAY   tiZANidine (ZANAFLEX) 2 MG tablet TAKE 1 TABLET AT BEDTIME AS NEEDED   tolnaftate (TINACTIN) 1 % cream Apply 1 application topically daily as needed (foot fungus).    TRUE METRIX BLOOD GLUCOSE TEST test strip USE AS DIRECTED TO CHECK BLOOD  SUGARS 2 TIMES PER DAY   TRUEplus Lancets 33G MISC TEST BLOOD SUGAR TWICE DAILY AS DIRECTED    Allergies:   Other, Penicillins, Sulfa antibiotics, Aspirin, and Codeine   Social History   Socioeconomic History   Marital status: Divorced    Spouse name: Not on file   Number of children: Not on file   Years of education: Not on file   Highest education level: Not on file  Occupational History   Occupation: Disabled    Comment: CNA  Tobacco Use   Smoking status: Former    Current packs/day: 0.50    Average  packs/day: 0.5 packs/day for 25.0 years (12.5 ttl pk-yrs)    Types: Cigarettes   Smokeless tobacco: Never   Tobacco comments:    quit smoking 20+ytrs ago  Vaping Use   Vaping status: Never Used  Substance and Sexual Activity   Alcohol use: No   Drug use: No   Sexual activity: Not Currently    Birth control/protection: Surgical  Other Topics Concern   Not on file  Social History Narrative   She lives in [place]. She has lots of family in the area and is accompanied by her sister.   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   She has a distant history of smoking, quit 20 years ago.   Right handed    Social Determinants of Health   Financial Resource Strain: Low Risk  (07/18/2022)   Overall Financial Resource Strain (CARDIA)    Difficulty of Paying Living Expenses: Not hard at all  Food Insecurity: No Food Insecurity (07/18/2022)   Hunger Vital Sign    Worried About Running Out of Food in the Last Year: Never true    Ran Out of Food in the Last Year: Never true  Transportation Needs: Unmet Transportation Needs (11/28/2022)   PRAPARE - Administrator, Civil Service (Medical): Yes    Lack of Transportation (Non-Medical): Yes  Physical Activity: Inactive (07/18/2022)   Exercise Vital Sign    Days of Exercise per Week: 0 days    Minutes of Exercise per Session: 0 min  Stress: No Stress Concern Present (07/18/2022)   [employer] of Occupational Health - Occupational Stress Questionnaire    Feeling of Stress : Not at all  Social Connections: Not on file    Family History: The patient's family history includes Allergies in her father; Cancer in an other family member; Diabetes in her brother, mother, sister, and son; Heart disease in her father, mother, and sister; Heart failure in her father; Hyperlipidemia in her father, mother, and sister; Hypertension in her brother, father, mother, sister, sister, and son.  ROS:   Please see the  history of present illness.    All other systems reviewed and are negative.  EKGs/Labs/Other Studies Reviewed:    The following studies were reviewed today:  Cardiac Studies & Procedures   CARDIAC CATHETERIZATION  CARDIAC CATHETERIZATION 01/02/2023  Narrative Successful transcatheter ASD closure using a 16 mm Amplatzer septal occluder device with intracardiac echo and fluoroscopic guidance  Recommend: Resume apixaban tonight No antiplatelet therapy due to bleeding risk with chronic iron deficiency anemia Overnight observation and discharge home tomorrow morning after limited 2D echocardiogram   CARDIAC CATHETERIZATION  CARDIAC CATHETERIZATION 09/07/2022  Narrative 1. Normal filling pressures 2. Moderate pulmonary hypertension in setting of ASD 3. Qp/Qs 1.5/1  Suspect hemodynamically significant ASD.   STRESS TESTS  MYOCARDIAL PERFUSION IMAGING 01/29/2018  Narrative  The left ventricular ejection fraction is normal (55-65%).  Nuclear stress EF: 58%.  There was no ST segment deviation noted during stress.  The study is normal.  This is a low risk study.  Normal stress nuclear study with no ischemia or infarction.  Gated ejection fraction 58% with normal wall motion.  Note significant right ventricular enlargement.  Suggest echocardiogram to further assess.   ECHOCARDIOGRAM  ECHOCARDIOGRAM LIMITED BUBBLE STUDY 02/06/2023  Narrative ECHOCARDIOGRAM LIMITED REPORT    Patient Name:   [name] [facility] Date of Exam: 02/06/2023 Medical Rec #:  [id number]       Height:       61.0 in Accession #:    [id number]      Weight:       179.0 lb Date of Birth:  [birth year]      BSA:          1.802 m Patient Age:    76 years        BP:           121/69 mmHg Patient Gender: F               HR:           54 bpm. Exam Location:  [facility]  Procedure: Limited Echo, Limited Color Doppler, Cardiac Doppler and Saline Contrast Bubble Study  Indications:    1 month s/p ASD Closure  Q21.1  History:        Patient has prior history of Echocardiogram examinations, most recent 01/02/2023. CHF, COPD; Risk Factors:Hypertension, Diabetes and Dyslipidemia. Septal Repair:ASD Closure on 01/02/2023.  Sonographer:    Thurman Coyer RDCS Referring Phys: [phone]  D   IMPRESSIONS   1. Left ventricular ejection fraction, by estimation, is 60 to 65%. The left ventricle has normal function. The left ventricle demonstrates regional wall motion abnormalities (see scoring diagram/findings for description). Left ventricular diastolic parameters are consistent with Grade II diastolic dysfunction (pseudonormalization). 2. Left atrial size was severely dilated. 3. Right atrial size was moderately dilated. 4. The aortic valve is tricuspid. No aortic stenosis is present. 5. There is mildly elevated pulmonary artery systolic pressure. 6. The inferior vena cava is dilated in size with <50% respiratory variability, suggesting right atrial pressure of 15 mmHg. 7. Agitated saline contrast bubble study was negative, with no evidence of any interatrial shunt. 8. There is an ASD closure device in place that is stable and well-seated. Bubble study is negative for right to left shunting but on subcostal views there does appear to be mild residual left to right shunting by color Doppler.  FINDINGS Left Ventricle: Left ventricular ejection fraction, by estimation, is 60 to 65%. The left ventricle has normal function. The left ventricle demonstrates regional wall motion abnormalities. Left ventricular diastolic parameters are consistent with Grade II diastolic dysfunction (pseudonormalization).  Right Ventricle: There is mildly elevated pulmonary artery systolic pressure. The tricuspid regurgitant velocity is 3.02 m/s, and with an assumed right atrial pressure of 8 mmHg, the estimated right ventricular systolic pressure is 44.5 mmHg.  Left Atrium: Left atrial size was severely dilated.  Right  Atrium: Right atrial size was moderately dilated.  Tricuspid Valve: Tricuspid valve regurgitation is trivial.  Aortic Valve: The aortic valve is tricuspid. No aortic stenosis is present.  Venous: The inferior vena cava is dilated in size with less than 50% respiratory variability, suggesting right atrial pressure of 15 mmHg.  IAS/Shunts: There is right bowing of the interatrial septum, suggestive of elevated left atrial  pressure. Agitated saline contrast was given intravenously to evaluate for intracardiac shunting. Agitated saline contrast bubble study was negative, with no evidence of any interatrial shunt.   Diastology LV e' medial:    6.81 cm/s LV E/e' medial:  18.6 LV e' lateral:   8.03 cm/s LV E/e' lateral: 15.8   IVC IVC diam: 2.80 cm  MITRAL VALVE                TRICUSPID VALVE MV Area (PHT): 4.06 cm     TR Peak grad:   36.5 mmHg MV Decel Time: 187 msec     TR Vmax:        302.00 cm/s MV E velocity: 127.00 cm/s MV A velocity: 63.60 cm/s MV E/A ratio:  2.00  Arvilla Meres MD Electronically signed by Arvilla Meres MD Signature Date/Time: 02/06/2023/12:41:01 PM    Final   TEE  ECHO TEE 08/15/2022  Narrative TRANSESOPHOGEAL ECHO REPORT    Patient Name:   [name] [facility] Date of Exam: 08/15/2022 Medical Rec #:  [id number]       Height:       61.0 in Accession #:    [id number]      Weight:       187.0 lb Date of Birth:  Jul 21, 1945      BSA:          1.836 m Patient Age:    76 years        BP:           120/68 mmHg Patient Gender: F               HR:           76 bpm. Exam Location:  Inpatient  Procedure: Transesophageal Echo, 3D Echo, Color Doppler and Cardiac Doppler  Indications:     I48.91* Unspecified atrial fibrillation  History:         Patient has prior history of Echocardiogram examinations, most recent 11/15/2020. CHF; Risk Factors:Hypertension, Diabetes, Dyslipidemia and Sleep Apnea.  Sonographer:     Irving Burton Senior RDCS Referring Phys:   [id number] [facility] Diagnosing Phys: Wilfred Lacy  PROCEDURE: After discussion of the risks and benefits of a TEE, an informed consent was obtained from the patient. The transesophogeal probe was passed without difficulty through the esophogus of the patient. Sedation performed by different physician. The patient was monitored while under deep sedation. Anesthestetic sedation was provided intravenously by Anesthesiology: 151mg  of Propofol, 60mg  of Lidocaine. The patient developed no complications during the procedure. A successful direct current cardioversion was performed at 200 joules with 1 attempt.  IMPRESSIONS   1. Left ventricular ejection fraction, by estimation, is 60 to 65%. The left ventricle has normal function. The left ventricle has no regional wall motion abnormalities. There is mild concentric left ventricular hypertrophy. 2. Right ventricular systolic function is normal. The right ventricular size is mildly enlarged. 3. Peak RV-RA gradient 31 mmHg. 4. Measurements done for Watchman. Left atrial size was severely dilated. No left atrial/left atrial appendage thrombus was detected. 5. Right atrial size was mildly dilated. 6. There is a small 1.9 x 1.2 cm relatively high secundum ASD with left to right flow. The pulmonary veins appear to drain normally to the left atrium. 7. The mitral valve is normal in structure. Trivial mitral valve regurgitation. No evidence of mitral stenosis. 8. The aortic valve is tricuspid. Aortic valve regurgitation is not visualized. No aortic stenosis is present.  FINDINGS Left Ventricle: Left ventricular  ejection fraction, by estimation, is 60 to 65%. The left ventricle has normal function. The left ventricle has no regional wall motion abnormalities. The left ventricular internal cavity size was normal in size. There is mild concentric left ventricular hypertrophy.  Right Ventricle: The right ventricular size is mildly enlarged. No increase in  right ventricular wall thickness. Right ventricular systolic function is normal.  Left Atrium: Measurements done for Watchman. Left atrial size was severely dilated. No left atrial/left atrial appendage thrombus was detected.  Right Atrium: Right atrial size was mildly dilated.  Pericardium: There is no evidence of pericardial effusion.  Mitral Valve: The mitral valve is normal in structure. Trivial mitral valve regurgitation. No evidence of mitral valve stenosis.  Tricuspid Valve: Peak RV-RA gradient 31 mmHg. The tricuspid valve is normal in structure. Tricuspid valve regurgitation is mild.  Aortic Valve: The aortic valve is tricuspid. Aortic valve regurgitation is not visualized. No aortic stenosis is present.  Pulmonic Valve: The pulmonic valve was normal in structure. Pulmonic valve regurgitation is not visualized.  Aorta: The aortic root is normal in size and structure.  IAS/Shunts: There is a small 1.9 x 1.2 cm relatively high secundum ASD with left to right flow. The pulmonary veins appear to drain normally to the left atrium.  Additional Comments: Spectral Doppler performed.  [employer] Electronically signed by Wilfred Lacy Signature Date/Time: 08/16/2022/3:56:59 PM    Final   MONITORS  LONG TERM MONITOR (3-14 DAYS) 03/13/2019   CT SCANS  CT CORONARY MORPH W/CTA COR W/SCORE 05/15/2018  Addendum 05/15/2018  9:57 PM ADDENDUM REPORT: 05/15/2018 21:54  CLINICAL DATA:  56F with hypertension and diabetes admitted with chest pain.  EXAM: Cardiac/Coronary  CT  TECHNIQUE: The patient was scanned on a [employer].  FINDINGS: A 120 kV prospective scan was triggered in the descending thoracic aorta at 111 HU's. Axial non-contrast 3 mm slices were carried out through the heart. The data set was analyzed on a dedicated work station and scored using the Agatson method. Gantry rotation speed was 250 msecs and collimation was .6 mm. No beta blockade and  0.8 mg of sl NTG was given. The 3D data set was reconstructed in 5% intervals of the 67-82 % of the R-R cycle. Diastolic phases were analyzed on a dedicated work station using MPR, MIP and VRT modes. The patient received 80 cc of contrast.  Aorta: Normal size. Ascending aorta 2.9 cm. Mild calcification of the aortic root. No dissection.  Aortic Valve:  Trileaflet.  No calcifications.  Coronary Arteries:  Normal coronary origin.  Right dominance.  RCA is a small, non-dominant artery. There is minimal (1-24%) non-obstructing mixed plaque in the proximal RCA.  Left main is a large artery that gives rise to LAD and LCX arteries.  LAD is a very large vessel that wraps the apex and gives rise to the PDA. The LAD has minimal (1-24%) mixed calcified and soft attenuation plaque proximally and mild (25-49%) soft plaque in the mid LAD, worse at the level of D1. There is moderate (25-49%) mixed plaque in the apical LAD.  LCX is a non-dominant artery that gives rise to two OM branches. There is minimal (1-24%) mixed plaque proximally.  Other findings:  Normal pulmonary vein drainage into the left atrium.  Normal let atrial appendage without a thrombus.  Pulmonary artery is enlarged consistent with elevated pulmonary pressures.  IMPRESSION: 1.  Study quality is limited by body habitus.  2. Coronary calcium score of 71.9. This was 77th  percentile for age and sex matched control.  2.  Normal coronary origin with hyperdominant LAD.  3. There is mild plaque in the mid LAD and moderate plaque in the distal LAD.  4.  Will send study for FFRct.  5.  Recommend aggressive risk factor modification.  6. Dilation of the pulmonary artery consistent with elevated pulmonary pressures.  Chilton Si, MD   Electronically Signed By: Chilton Si On: 05/15/2018 21:54  Narrative EXAM: OVER-READ INTERPRETATION  CT CHEST  The following report is an over-read performed by  radiologist Dr. Genevive Bi of [facility] on 05/15/2018. This over-read does not include interpretation of cardiac or coronary anatomy or pathology. The coronary CTA interpretation by the cardiologist is attached.  COMPARISON:  Chest radiograph 05/15/2018  FINDINGS: Limited view of the lung parenchyma demonstrates mild basilar atelectasis. Airways are normal.  Limited view of the mediastinum demonstrates no adenopathy. Esophagus normal.  Limited view of the upper abdomen unremarkable.  Limited view of the skeleton and chest wall is unremarkable. Degenerative osteophytosis of the spine.  IMPRESSION: 1. Mild basilar atelectasis. 2. Degenerate spurring of the spine  Electronically Signed: By: Genevive Bi M.D. On: 05/15/2018 19:14          EKG:  EKG is ordered today. The ekg ordered today demonstrates SB with HR 51bpm  Recent Labs: 05/17/2022: Magnesium 2.1 10/22/2022: ALT 16 01/11/2023: B Natriuretic Peptide 100.8; BUN 17; Creatinine, Ser 0.90; Potassium 3.5; Sodium 138 01/28/2023: Hemoglobin 9.5; Platelet Count 319   Recent Lipid Panel    Component Value Date/Time   CHOL 156 12/31/2022 1628   TRIG 114 12/31/2022 1628   HDL 84 12/31/2022 1628   CHOLHDL 1.9 12/31/2022 1628   CHOLHDL 1.8 05/04/2021 1014   VLDL 3 05/04/2021 1014   LDLCALC 52 12/31/2022 1628   LDLDIRECT 66.0 01/27/2015 1104   Physical Exam:    VS:  BP (!) 130/52   Pulse 70   Ht 5\' 1"  (1.549 m)   Wt 180 lb (81.6 kg)   SpO2 97%   BMI 34.01 kg/m     Wt Readings from Last 3 Encounters:  02/06/23 180 lb (81.6 kg)  02/04/23 179 lb (81.2 kg)  01/28/23 180 lb 9.6 oz (81.9 kg)    General: Well developed, well nourished, NAD Neck: Negative for carotid bruits. No JVD Lungs:Clear to ausculation bilaterally. No wheezes, rales, or rhonchi. Breathing is unlabored. Cardiovascular: RRR with S1 S2. No murmurs Extremities: No edema.  Neuro: Alert and oriented. No focal deficits. No  facial asymmetry. MAE spontaneously. Psych: Responds to questions appropriately with normal affect.    ASSESSMENT/PLAN:    ASD: s/p successful ASD closure with 16mm Amplatzer septal occluder device without complication. Post op limited echo with EF 55%, moderate RV enlargement/dysfunction. Continue Eliquis monotherapy. Echo with bubble today with no evidence of interseptal shunting. She will require dental SBE for 6 months. Azithromycin sent to preferred pharm. Plan one year follow up with our team with echo/bubble and Dr. Shirlee Latch in the interm.    Cough: Reports ongoing cough for several months. She has lots of sinus drainage with frequent throat clearing. Lungs are CTA. She was previously recommended to take Claritin and reports cough improved but she only takes this intermittently. Recommend daily use to observe for improvement.    Atrial flutter: Recent DCCV 02/04/23 to NSR. EKG today with sinus bradycardia. Continue Eliquis.    Chronic diastolic CHF: Followed by Dr. Shirlee Latch. Has Cardiomems in place. Appears euvolemic today.  Pulmonary venous hypertension: Followed closely by [facility]. V/Q scan 5/22 with no PE noted.    Hx of GI bleeding: Continue to monitor closely with Eliquis. Denies bleeding in stool or urine.    HTN: Stable today with no changes needed    Medication Adjustments/Labs and Tests Ordered: Current medicines are reviewed at length with the patient today.  Concerns regarding medicines are outlined above.  Orders Placed This Encounter  Procedures   EKG 12-Lead   No orders of the defined types were placed in this encounter.   Patient Instructions  Medication Instructions:  Your physician recommends that you continue on your current medications as directed. Please refer to the Current Medication list given to you today.  *If you need a refill on your cardiac medications before your next appointment, please call your pharmacy*   Lab Work: NONE If you have labs (blood  work) drawn today and your tests are completely normal, you will receive your results only by: MyChart Message (if you have MyChart) OR A paper copy in the mail If you have any lab test that is abnormal or we need to change your treatment, we will call you to review the results.   Testing/Procedures: EKG   Follow-Up: At [facility], you and your health needs are our priority.  As part of our continuing mission to provide you with exceptional heart care, we have created designated Provider Care Teams.  These Care Teams include your primary Cardiologist (physician) and Advanced Practice Providers (APPs -  Physician Assistants and Nurse Practitioners) who all work together to provide you with the care you need, when you need it.  We recommend signing up for the patient portal called "MyChart".  Sign up information is provided on this After Visit Summary.  MyChart is used to connect with patients for Virtual Visits (Telemedicine).  Patients are able to view lab/test results, encounter notes, upcoming appointments, etc.  Non-urgent messages can be sent to your provider as well.   To learn more about what you can do with MyChart, go to [email or web address].    Your next appointment:   KEEP SCHEDULED 1 YEAR FOLLOW-UP    Signed, Georgie Chard, NP  02/06/2023 3:55 PM     [facility]

## 2023-02-06 ENCOUNTER — Other Ambulatory Visit: Payer: Self-pay | Admitting: Cardiovascular Disease

## 2023-02-06 ENCOUNTER — Ambulatory Visit: Payer: Medicare HMO | Attending: Cardiovascular Disease | Admitting: Cardiology

## 2023-02-06 ENCOUNTER — Other Ambulatory Visit: Payer: Self-pay | Admitting: Cardiology

## 2023-02-06 ENCOUNTER — Ambulatory Visit (HOSPITAL_BASED_OUTPATIENT_CLINIC_OR_DEPARTMENT_OTHER): Payer: Medicare HMO

## 2023-02-06 VITALS — BP 130/52 | HR 70 | Ht 61.0 in | Wt 180.0 lb

## 2023-02-06 DIAGNOSIS — I272 Pulmonary hypertension, unspecified: Secondary | ICD-10-CM | POA: Insufficient documentation

## 2023-02-06 DIAGNOSIS — I10 Essential (primary) hypertension: Secondary | ICD-10-CM | POA: Insufficient documentation

## 2023-02-06 DIAGNOSIS — Q211 Atrial septal defect, unspecified: Secondary | ICD-10-CM

## 2023-02-06 DIAGNOSIS — K922 Gastrointestinal hemorrhage, unspecified: Secondary | ICD-10-CM | POA: Diagnosis not present

## 2023-02-06 DIAGNOSIS — I480 Paroxysmal atrial fibrillation: Secondary | ICD-10-CM | POA: Insufficient documentation

## 2023-02-06 DIAGNOSIS — I5032 Chronic diastolic (congestive) heart failure: Secondary | ICD-10-CM | POA: Insufficient documentation

## 2023-02-06 DIAGNOSIS — R053 Chronic cough: Secondary | ICD-10-CM | POA: Insufficient documentation

## 2023-02-06 LAB — ECHOCARDIOGRAM LIMITED BUBBLE STUDY: Area-P 1/2: 4.06 cm2

## 2023-02-06 NOTE — Patient Instructions (Signed)
Medication Instructions:  Your physician recommends that you continue on your current medications as directed. Please refer to the Current Medication list given to you today.  *If you need a refill on your cardiac medications before your next appointment, please call your pharmacy*   Lab Work: NONE If you have labs (blood work) drawn today and your tests are completely normal, you will receive your results only by: MyChart Message (if you have MyChart) OR A paper copy in the mail If you have any lab test that is abnormal or we need to change your treatment, we will call you to review the results.   Testing/Procedures: EKG   Follow-Up: At [facility], you and your health needs are our priority.  As part of our continuing mission to provide you with exceptional heart care, we have created designated Provider Care Teams.  These Care Teams include your primary Cardiologist (physician) and Advanced Practice Providers (APPs -  Physician Assistants and Nurse Practitioners) who all work together to provide you with the care you need, when you need it.  We recommend signing up for the patient portal called "MyChart".  Sign up information is provided on this After Visit Summary.  MyChart is used to connect with patients for Virtual Visits (Telemedicine).  Patients are able to view lab/test results, encounter notes, upcoming appointments, etc.  Non-urgent messages can be sent to your provider as well.   To learn more about what you can do with MyChart, go to [email or web address].    Your next appointment:   KEEP SCHEDULED 1 YEAR FOLLOW-UP

## 2023-02-07 ENCOUNTER — Inpatient Hospital Stay: Payer: Medicare HMO

## 2023-02-12 ENCOUNTER — Telehealth: Payer: Self-pay | Admitting: *Deleted

## 2023-02-12 NOTE — Telephone Encounter (Signed)
-----   Message from [name] sent at 02/07/2023  3:43 PM EDT ----- Please let the patient know that her echocardiogram looks great with no evidence of shunting on echo as we discussed in our visit.

## 2023-02-12 NOTE — Progress Notes (Signed)
PCP: [name], MD Cardiology: Dr. Herbie Baltimore HF Cardiology: Dr. Shirlee Latch  77 y.o. with history of OHS/OSA and RV dysfunction was referred by Dr. Herbie Baltimore for evaluation of CHF.  She has a history of OSA/OHS.  She uses CPAP at night but not currently using oxygen during the day.  Remote smoker.  She had an echo in 9/20 showing EF 50-55% but moderate-severe RV dysfunction with mild-moderate RV dilation.  RHC/LHC in 10/20 showed normal coronaries, pulmonary venous hypertension.  V/Q scan in 2/21 did not show evidence for acute on chronic PE.    Cardiomems was placed in 5/21.  RHC at that time showed normal filling pressures and moderate pulmonary hypertension but suspect due to high output.  No evidence for shunt lesion.  Echo showed EF 60-65%, enlarged RV with normal systolic function.   She was admitted in 5/21 with syncope, thought to be due to a seizure.    She had COVID-19 infection in 8/21. 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   Echo in 9/21 with EF 50-55%, severe RV enlargement and severely decreased RV systolic function, PASP 54 mmHg.    She was admitted in 5/22 with CHF, bronchitis.  She was diuresed and treated with antibiotics. Echo in 5/22 showed EF 60-65%, mild LVH, ?normal RV, severe LAE, ?normal PA pressure, IVC normal.  V/Q scan negative.   In 10/22, she was admitted with GI bleeding, thought to be diverticular.  BP meds were decreased and Lasix was cut back.  With increased weight, she increased her Lasix back to 80 mg daily.   She was seen in the ER in 1/24 with atypical atrial flutter, this was new.  She was started on apixaban and sent home from ER. She was seen for follow up and planned to set her up for DCCV, but she was admitted on 07/25/22 with GI bleed, thought to be diverticular.  Apixaban was held and she was tranfused. Apixaban was restarted after discharge.  She did  not have endoscopy.   Follow up 08/01/22, she remained in atypical atrial flutter. Arranged for TEE/DCCV.  TEE/DCCV showed EF 60-65%, mild LVH, RV normal, small ASD to L->R flow, no LAA thrombus. S/p DCCV --> NSR.  RHC (3/24) showed normal filling pressures, moderate pulmonary hypertension in setting of ASD, Qp/Qs 1.5/1. She was referred to Structural Heart team for ASD repair consideration.  S/p ASD repair 7/24. Ltd echo 7/24 showed EF 55-60%, RV moderately reduced.  Follow up 7/24, found to be back in AFL, rate controlled. Amiodarone started, and underwent DCCV 02/04/23 to NSR.  Ltd Echo with bubble study 8/24 showed EF 60-65%, G2DD, normal RV, PASP 44.5 mmhg, no evidence of shunting, IVC dilated.  Today she returns for post DCCV HF follow up with her sister. Overall feeling fine. She remains SOB with housework, able to do ADLs. She walks with a cane for balance. She has occasional dizziness. She wears CPAP ~ 3-4 nights/week. Denies abnormal bleeding, CP, edema, or PND/Orthopnea. Appetite ok. No fever or chills. Weight at home 180 pounds. Taking all medications.   ECG (personally reviewed): SR with 1AVB, PR 232 msec  Cardiomems: PAD 20 on 02/14/23, goal 20  Labs (10/20): K 4.1, creatinine 1.15, hgb 11.4 Labs (6/21): K 3.5, creatinine 1.04, BNP 81 Labs (12/21): K 3.7, creatinine 0.79 Labs (11/22): LDL 63, BNP 33 Labs (2/23): K 3.8, creatinine 0.97 Labs (9/23): K 4.1, creatinine 0.97 Labs (  1/24): K 3.5, creatinine 0.92, BNP 167 Labs (2/24): transferrin saturation 7%, hgb 8.1 Labs (3/24): K 4.0, creatinine 0.99 Labs (7/24): K 3.5, creatinine 0.90  PMH: 1. OHS/OSA: She uses CPAP.  2. H/o CVA 3. Seizure disorder 4. Type 2 DM 5. Depression 6. HTN 7. Hyperlipidemia 8. Chronic diastolic CHF: Echo (9/20) with EF 50-55%, moderate to severe RV dilation with mild-moderately decreased RV systolic function.  - RHC/LHC (10/20): normal coronaries; mean RA 10, PA 56/14 mean 31, mean PCWP 13,  LVEDP 18, CO/CI 7.35/3.73, PVR 2.44.  - RHC/Cardiomems placement (5/21): mean RA 7, PA 58/21 mean 34, PCWP mean 12, CI 8.3 F/3.95 T, PVR 2.86 WU. No evidence for shunt lesion.  - Echo (5/21): EF 60-65%, RV enlarged with normal systolic function.  - Echo (9/21): EF 50-55%, severe RV enlargement and severely decreased RV systolic function, PASP 54 mmHg.  - Echo (5/22): EF 60-65%, mild LVH, ?normal RV, severe LAE, ?normal PA pressure, IVC normal. - TEE (2/23): EF 60-65%, mild LVH, RV normal - RHC (3/24): RA mean 6, PA 57/12 (mean 31), PCWP mean 13, CO/CI (Fick) 17/9.4, Qp/Qs 1.5/1 - Ltd Echo with bubble study (8/24):EF 60-65%, G2DD, normal RV, PASP 44.5 mmhg, no evidence of shunting, IVC dilated. 9. Primarily pulmonary venous hypertension.  - V/Q scan (2/21, 5/22): No evidence for acute or chronic PE.  - PFTs normal in 5/21.  10. COVID-19 PNA 8/21.  - CT chest in 10/21 with emphysema and mild fibrosis concerning for post-infectious inflammatory fibrosis from COVID.  11. GI bleeding: 10/22, diverticular bleed.  - Presumed diverticular bleed in 1/24.  12. Atypical atrial flutter/atrial fibrillation: First noted 1/24.  - TEE/DCCV (2/24) to NSR - DCCV (8/24) to NSR 13. ASD: RHC (3/24) showed Qp/Qs 1.5/1, suspect hemodynamically significant ASD. - s/p ASD closer (7/24) - Ltd echo w/ bubble study (8/24) showed no evidence of shunting, stable well-seated ASD closure device.  Social History   Socioeconomic History   Marital status: Divorced    Spouse name: Not on file   Number of children: Not on file   Years of education: Not on file   Highest education level: Not on file  Occupational History   Occupation: Disabled    Comment: CNA  Tobacco Use   Smoking status: Former    Current packs/day: 0.50    Average packs/day: 0.5 packs/day for 25.0 years (12.5 ttl pk-yrs)    Types: Cigarettes   Smokeless tobacco: Never   Tobacco comments:    quit smoking 20+ytrs ago  Vaping Use   Vaping  status: Never Used  Substance and Sexual Activity   Alcohol use: No   Drug use: No   Sexual activity: Not Currently    Birth control/protection: Surgical  Other Topics Concern   Not on file  Social History Narrative   She lives in [place]. She has lots of family in the area and is accompanied by her sister.   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   She has a distant history of smoking, quit 20 years ago.   Right handed    Social Determinants of Health   Financial Resource Strain: Low Risk  (07/18/2022)   Overall Financial Resource Strain (CARDIA)    Difficulty of Paying Living Expenses: Not hard at all  Food Insecurity: No Food Insecurity (07/18/2022)   Hunger Vital Sign    Worried About Running Out of Food in the Last Year: Never true    Ran Out  of Food in the Last Year: Never true  Transportation Needs: Unmet Transportation Needs (11/28/2022)   PRAPARE - Transportation    Lack of Transportation (Medical): Yes    Lack of Transportation (Non-Medical): Yes  Physical Activity: Inactive (07/18/2022)   Exercise Vital Sign    Days of Exercise per Week: 0 days    Minutes of Exercise per Session: 0 min  Stress: No Stress Concern Present (07/18/2022)   [employer] of Occupational Health - Occupational Stress Questionnaire    Feeling of Stress : Not at all  Social Connections: Not on file  Intimate Partner Violence: Not At Risk (10/22/2018)   Humiliation, Afraid, Rape, and Kick questionnaire    Fear of Current or Ex-Partner: No    Emotionally Abused: No    Physically Abused: No    Sexually Abused: No   Family History  Problem Relation Age of Onset   Allergies Father    Heart disease Father        before 62   Heart failure Father    Hypertension Father    Hyperlipidemia Father    Heart disease Mother    Diabetes Mother    Hypertension Mother    Hyperlipidemia Mother    Hypertension Sister    Heart disease Sister        before 14    Hyperlipidemia Sister    Diabetes Brother    Hypertension Brother    Diabetes Sister    Hypertension Sister    Diabetes Son    Hypertension Son    Cancer Other    Current Outpatient Medications  Medication Sig Dispense Refill   acetaminophen (TYLENOL) 500 MG tablet Take 500-1,000 mg by mouth every 4 (four) hours as needed for moderate pain or mild pain.     albuterol (VENTOLIN HFA) 108 (90 Base) MCG/ACT inhaler Inhale 1-2 puffs into the lungs every 6 (six) hours as needed for wheezing or shortness of breath. 18 g 5   Alcaftadine (LASTACAFT) 0.25 % SOLN Place 1-2 drops into both eyes daily as needed (allergy eye/irritation.).     amiodarone (PACERONE) 200 MG tablet Take 1 tablet (200 mg total) by mouth 2 (two) times daily for 14 days, THEN 1 tablet (200 mg total) daily. 90 tablet 3   apixaban (ELIQUIS) 5 MG TABS tablet Take 1 tablet (5 mg total) by mouth 2 (two) times daily. 180 tablet 1   Ascorbic Acid (VITAMIN C) 1000 MG tablet Take 1,000 mg by mouth in the morning.     atorvastatin (LIPITOR) 40 MG tablet Take 1 tablet (40 mg total) by mouth daily. 90 tablet 3   B-D ULTRAFINE III SHORT PEN 31G X 8 MM MISC USE DAILY WITH VICTOZA AND/OR NOVOLOG. 400 each 2   benzonatate (TESSALON) 100 MG capsule TAKE 1 CAPSULE THREE TIMES DAILY AS NEEDED FOR COUGH 30 capsule 1   Blood Glucose Monitoring Suppl (TRUE METRIX METER) w/Device KIT USE AS DIRECTED 1 kit 1   carvedilol (COREG) 12.5 MG tablet TAKE 1 TABLET TWICE DAILY 180 tablet 3   diclofenac sodium (VOLTAREN) 1 % GEL Apply 2 g topically 4 (four) times daily as needed (pain).     FeFum-FePoly-FA-B Cmp-C-Biot (INTEGRA PLUS) CAPS Take 1 capsule by mouth every morning. 30 capsule 2   ferrous sulfate 324 MG TBEC Take 324 mg by mouth daily with breakfast.     furosemide (LASIX) 20 MG tablet Take 4 tablets (80 mg total) by mouth 2 (two) times daily. (Patient taking differently: Take  80 mg by mouth 2 (two) times daily. Take 4 tablets (80 mg) in  morning and  3 tablets (60 mg) at night.) 240 tablet 3   Glucagon (GVOKE HYPOPEN 2-PACK) 0.5 MG/0.1ML SOAJ INJECT 0.5 MG (1 PEN) INTO THE SKIN DAILY AS NEEDED. 2 mL 11   guaiFENesin-dextromethorphan (ROBITUSSIN DM) 100-10 MG/5ML syrup Take 10 mLs by mouth every 6 (six) hours as needed for cough. 118 mL 0   hydrALAZINE (APRESOLINE) 100 MG tablet Take 1 tablet (100 mg total) by mouth 3 (three) times daily. 270 tablet 3   hydrocortisone cream 1 % Apply 1 application topically daily as needed for itching.      insulin glargine, 1 Unit Dial, (TOUJEO) 300 UNIT/ML Solostar Pen Inject 20 Units into the skin at bedtime.     insulin lispro (HUMALOG KWIKPEN) 100 UNIT/ML KwikPen Inject 0-10 Units into the skin See admin instructions. Sliding scale:Inject  (high blood sugar over 150). 150-199=2 units;200-249=4 units,250-299=6 units;300-349=8 units;350 above 10 units: Over 350 call physician 15 mL 0   Isopropyl Alcohol (ALCOHOL WIPES) 70 % MISC Apply 1 each topically 2 (two) times daily. 300 each 3   isosorbide mononitrate (IMDUR) 30 MG 24 hr tablet Take 1 tablet (30 mg total) by mouth daily. 90 tablet 3   lamoTRIgine (LAMICTAL) 150 MG tablet Take 1 tablet (150 mg total) by mouth 2 (two) times daily. 180 tablet 3   levETIRAcetam (KEPPRA) 500 MG tablet Take 2 tablets every night 180 tablet 3   loratadine (CLARITIN) 10 MG tablet Take 1 tablet (10 mg total) by mouth daily. 30 tablet 2   oxyCODONE (OXY IR/ROXICODONE) 5 MG immediate release tablet Take 5 mg by mouth every 6 (six) hours as needed (pain.).     OZEMPIC, 1 MG/DOSE, 4 MG/3ML SOPN INJECT 1MG  UNDER THE SKIN ONE TIME WEEKLY 9 mL 3   Pancrelipase, Lip-Prot-Amyl, 25000-79000 units CPEP Take 2 capsules by mouth in the morning, at noon, and at bedtime.     potassium chloride SA (KLOR-CON M) 20 MEQ tablet Take 2 tablets (40 mEq total) by mouth daily. With additional on metolazone days 300 tablet 3   sennosides-docusate sodium (SENOKOT-S) 8.6-50 MG tablet Take 1-2 tablets  by mouth at bedtime as needed for constipation.     sertraline (ZOLOFT) 50 MG tablet TAKE 1 TABLET EVERY DAY 90 tablet 3   tiZANidine (ZANAFLEX) 2 MG tablet TAKE 1 TABLET AT BEDTIME AS NEEDED 30 tablet 3   tolnaftate (TINACTIN) 1 % cream Apply 1 application topically daily as needed (foot fungus).      TRUE METRIX BLOOD GLUCOSE TEST test strip USE AS DIRECTED TO CHECK BLOOD SUGARS 2 TIMES PER DAY 200 strip 10   TRUEplus Lancets 33G MISC TEST BLOOD SUGAR TWICE DAILY AS DIRECTED 200 each 3   No current facility-administered medications for this encounter.   Facility-Administered Medications Ordered in Other Encounters  Medication Dose Route Frequency Provider Last Rate Last Admin   0.9 %  sodium chloride infusion (Manually program via Guardrails IV Fluids)   Intravenous Once [place], Basil Blakesley M, FNP       0.9 %  sodium chloride infusion (Manually program via Guardrails IV Fluids)   Intravenous Once [place], [place]       Wt Readings from Last 3 Encounters:  02/18/23 82.2 kg (181 lb 3.2 oz)  02/06/23 81.6 kg (180 lb)  02/04/23 81.2 kg (179 lb)   BP (!) 156/84   Pulse 62   Wt 82.2  kg (181 lb 3.2 oz)   SpO2 98%   BMI 34.24 kg/m  Physical Exam General:  NAD. No resp difficulty, walked into clinic with cane. HEENT: Normal Neck: Supple. No JVD. Carotids 2+ bilat; no bruits. No lymphadenopathy or thryomegaly appreciated. Cor: PMI nondisplaced. Regular rate & rhythm. No rubs, gallops or murmurs. Lungs: Clear Abdomen: Soft, nontender, nondistended. No hepatosplenomegaly. No bruits or masses. Good bowel sounds. Extremities: No cyanosis, clubbing, rash, edema Neuro: Alert & oriented x 3, cranial nerves grossly intact. Moves all 4 extremities w/o difficulty. Affect pleasant.  Assessment/Plan: 1. Chronic diastolic CHF/RV failure: Echo in 9/20 showed EF 50-55% with moderate-severe RV dilation and mild-moderately decreased systolic function. RHC/LHC showed mild-moderate pulmonary venous  hypertension and no significant coronary disease.  Cause of RV dysfunction is not clear, possibly due to diastolic CHF + OHS/OSA.  V/Q scan in 2/21 did not show acute or chronic PE.  Cardiomems was placed in 5/21, normal filling pressures with moderate pulmonary hypertension likely due to high output (no evidence for shunt lesion on RHC).  Echo in 9/21 with EF 50-55%, severe RVE with severe RV dysfunction.  Echo in 5/22 with EF 60-65%, RV function reportedly normal with normal PA pressure.  Her dyspnea is partially related to lung disease; based on last CT, she has developed mild pulmonary fibrosis since COVID, possibly post-inflammatory.  TEE (2/24) showed EF 60-65%, RV normal. RHC (3/24) with normal filling pressures and moderate pulmonary hypertension in setting of ASD. NYHA IIb. She is not volume overloaded on exam today. - Continue Lasix 80 qam/60 qpm.  BMET/BNP today.  - Continue Coreg 12.5 mg bid. - She did not tolerate SGLT2-inhibitor due to recurrent yeast infections.    2. OHS/OSA: She is using CPAP but not oxygen during the day (no longer appears to need). CT chest in 10/21 with emphysema and mild fibrosis, possibly post-inflammatory after COVID-19 infection.   - Followup with pulmonary (Dr. Isaiah Serge).  3. Pulmonary hypertension: RHC in 10/20 showed mild-moderate pulmonary venous hypertension.  RHC in 5/21 showed moderate pulmonary hypertension with low PVR and high CO, likely PH due to high output, no evidence for shunt lesion. No role for selective pulmonary vasodilators.  - On recent TEE (2/24), small secundum ASD seen with left to right flow. RHC (3/24) showed moderate pulmonary hypertension in setting of ASD, Qp/Qs 1.5/1 4. HTN: BP elevated today, however she has not had all of her morning medications. BP generally well-controlled on hydralazine, Imdur and Coreg.  - No changes today. 5. Obesity: Body mass index is 34.24 kg/m. - She is on semaglutide. 6. GI bleeding: Recent presumed  recurrent diverticular bleed in 1/24.  Apixaban stopped transiently.  7. Atypical atrial flutter/atrial fibrillation: Atypical flutter at follow up 2/24.  HR controlled on Coreg. S/p TEE/DCCV to NSR. Back in AFL 7/24, rate controlled. Amiodarone started and underwent DCCV 8/24 to NSR. - She in NSR on ECG today. - Decrease amiodarone to 100 mg daily. Check LFTs/TSH today. She will need a regular eye exam.  - Continue Coreg.  - Continue apixaban.  - Watchman would be a reasonable consideration in the future, especially if she bleeds again.   8. Fe deficiency: She had Feraheme infusion 2/24. - She is on oral iron supplementation and follows with Heme/Onc and GI. 9. ASD: small 1.9 x 1.2 cm relatively high secundum ASD with left to right flow seen on TEE. RHC (3/24) showed hemodynamically significant ASD with Qp/Qs 1.5/1. - s/p ASD closure 7/24.  Follow  up in 4 months with Dr. Shirlee Latch.  [facility] FNP-BC 02/18/2023

## 2023-02-12 NOTE — Telephone Encounter (Signed)
Called the patient to give echo results.  She reports that she fell Thursday or Friday walking in the front door onto right side, hit head on floor, no cuts.  Had seen a small bruise but it is gone.  No vision changes no headaches.  Her balance is not good, she feels that is the reason she fell.  It is not worse since hitting her head.   Sees Dr. Shirlee Latch next week.   I adv if symptoms above develop or anytime she hits her head she needs to come to the ER for urgent eval of bleeding since she is on Eliquis.  Pt voices understanding and appreciation for information provided. Lendon Ka, RN 8/

## 2023-02-13 NOTE — Telephone Encounter (Signed)
Completely agree that if she does have a headache or any change in vision or anything with having in her head it is reasonable to be evaluated because of her being on anticoagulation.  Bryan Lemma, MD

## 2023-02-14 ENCOUNTER — Ambulatory Visit: Payer: Medicare HMO

## 2023-02-18 ENCOUNTER — Ambulatory Visit (HOSPITAL_COMMUNITY): Admission: RE | Admit: 2023-02-18 | Payer: Medicare HMO | Source: Ambulatory Visit

## 2023-02-18 ENCOUNTER — Encounter (HOSPITAL_COMMUNITY): Payer: Self-pay

## 2023-02-18 ENCOUNTER — Other Ambulatory Visit: Payer: Self-pay | Admitting: Internal Medicine

## 2023-02-18 VITALS — BP 156/84 | HR 62 | Wt 181.2 lb

## 2023-02-18 DIAGNOSIS — J439 Emphysema, unspecified: Secondary | ICD-10-CM | POA: Diagnosis not present

## 2023-02-18 DIAGNOSIS — I272 Pulmonary hypertension, unspecified: Secondary | ICD-10-CM

## 2023-02-18 DIAGNOSIS — I4891 Unspecified atrial fibrillation: Secondary | ICD-10-CM | POA: Insufficient documentation

## 2023-02-18 DIAGNOSIS — Z8719 Personal history of other diseases of the digestive system: Secondary | ICD-10-CM

## 2023-02-18 DIAGNOSIS — I110 Hypertensive heart disease with heart failure: Secondary | ICD-10-CM | POA: Diagnosis not present

## 2023-02-18 DIAGNOSIS — E611 Iron deficiency: Secondary | ICD-10-CM | POA: Diagnosis not present

## 2023-02-18 DIAGNOSIS — J8410 Pulmonary fibrosis, unspecified: Secondary | ICD-10-CM | POA: Diagnosis not present

## 2023-02-18 DIAGNOSIS — Z6834 Body mass index (BMI) 34.0-34.9, adult: Secondary | ICD-10-CM | POA: Insufficient documentation

## 2023-02-18 DIAGNOSIS — I484 Atypical atrial flutter: Secondary | ICD-10-CM

## 2023-02-18 DIAGNOSIS — E662 Morbid (severe) obesity with alveolar hypoventilation: Secondary | ICD-10-CM

## 2023-02-18 DIAGNOSIS — E669 Obesity, unspecified: Secondary | ICD-10-CM | POA: Diagnosis not present

## 2023-02-18 DIAGNOSIS — Z7901 Long term (current) use of anticoagulants: Secondary | ICD-10-CM | POA: Insufficient documentation

## 2023-02-18 DIAGNOSIS — G4733 Obstructive sleep apnea (adult) (pediatric): Secondary | ICD-10-CM

## 2023-02-18 DIAGNOSIS — I10 Essential (primary) hypertension: Secondary | ICD-10-CM | POA: Diagnosis not present

## 2023-02-18 DIAGNOSIS — Z79899 Other long term (current) drug therapy: Secondary | ICD-10-CM | POA: Insufficient documentation

## 2023-02-18 DIAGNOSIS — Z8774 Personal history of (corrected) congenital malformations of heart and circulatory system: Secondary | ICD-10-CM | POA: Diagnosis not present

## 2023-02-18 DIAGNOSIS — Q211 Atrial septal defect, unspecified: Secondary | ICD-10-CM

## 2023-02-18 DIAGNOSIS — I5032 Chronic diastolic (congestive) heart failure: Secondary | ICD-10-CM | POA: Diagnosis not present

## 2023-02-18 DIAGNOSIS — R053 Chronic cough: Secondary | ICD-10-CM

## 2023-02-18 LAB — COMPREHENSIVE METABOLIC PANEL
ALT: 39 U/L (ref 0–44)
AST: 34 U/L (ref 15–41)
Albumin: 3.5 g/dL (ref 3.5–5.0)
Alkaline Phosphatase: 123 U/L (ref 38–126)
Anion gap: 10 (ref 5–15)
BUN: 15 mg/dL (ref 8–23)
CO2: 28 mmol/L (ref 22–32)
Calcium: 8.8 mg/dL — ABNORMAL LOW (ref 8.9–10.3)
Chloride: 97 mmol/L — ABNORMAL LOW (ref 98–111)
Creatinine, Ser: 0.91 mg/dL (ref 0.44–1.00)
GFR, Estimated: >60 mL/min (ref >60)
Glucose, Bld: 100 mg/dL — ABNORMAL HIGH (ref 70–99)
Potassium: 3.6 mmol/L (ref 3.5–5.1)
Sodium: 135 mmol/L (ref 135–145)
Total Bilirubin: 0.4 mg/dL (ref 0.3–1.2)
Total Protein: 7.2 g/dL (ref 6.5–8.1)

## 2023-02-18 LAB — TSH: TSH: 5.517 u[IU]/mL — ABNORMAL HIGH (ref 0.350–4.500)

## 2023-02-18 MED ORDER — AMIODARONE HCL 100 MG PO TABS: 100.0000 mg | ORAL_TABLET | Freq: Every day | ORAL | 3 refills | Status: DC

## 2023-02-18 NOTE — Patient Instructions (Addendum)
Medication Changes:  DECREASE AMIODARONE TO 100MG  ONCE DAILY- new prescription sent to your pharmacy   Lab Work:  Labs done today, your results will be available in MyChart, we will contact you for abnormal readings.  Follow-Up in: 4 MONTHS WITH DR. Shirlee Latch AS SCHEDULED   At the [facility], you and your health needs are our priority. We have a designated team specialized in the treatment of Heart Failure. This Care Team includes your primary Heart Failure Specialized Cardiologist (physician), Advanced Practice Providers (APPs- Physician Assistants and Nurse Practitioners), and Pharmacist who all work together to provide you with the care you need, when you need it.   You may see any of the following providers on your designated Care Team at your next follow up:  Dr. Arvilla Meres Dr. Marca Ancona Dr. Marcos Eke, NP Robbie Lis, [place] [facility] [place], [place] Brynda Peon, NP Karle Plumber, PharmD   Please be sure to bring in all your medications bottles to every appointment.   Need to Contact us:  If you have any questions or concerns before your next appointment please send [place] a message through [place] or call our office at [phone].    TO LEAVE A MESSAGE FOR THE NURSE SELECT OPTION 2, PLEASE LEAVE A MESSAGE INCLUDING: YOUR NAME DATE OF BIRTH CALL BACK NUMBER REASON FOR CALL**this is important as we prioritize the call backs  YOU WILL RECEIVE A CALL BACK THE SAME DAY AS LONG AS YOU CALL BEFORE 4:00 PM

## 2023-02-19 ENCOUNTER — Telehealth (HOSPITAL_COMMUNITY): Payer: Self-pay

## 2023-02-19 NOTE — Telephone Encounter (Signed)
Spoke with patient regarding the following results. Patient made aware and patient verbalized understanding.

## 2023-02-19 NOTE — Telephone Encounter (Signed)
-----   Message from Jacklynn Ganong sent at 02/18/2023  2:33 PM EDT ----- Labs stable, no change to plan discussed today.  TSH mildly elevated. She is on amiodarone, we have reduced the dose today.  I will forward labs to her PCP

## 2023-02-21 ENCOUNTER — Ambulatory Visit (INDEPENDENT_AMBULATORY_CARE_PROVIDER_SITE_OTHER): Payer: Medicare HMO | Admitting: Family Medicine

## 2023-02-21 ENCOUNTER — Ambulatory Visit: Payer: Medicare HMO

## 2023-02-21 ENCOUNTER — Encounter: Payer: Self-pay | Admitting: Family Medicine

## 2023-02-21 VITALS — BP 120/60 | HR 63 | Temp 98.1°F | Ht 61.0 in | Wt 177.0 lb

## 2023-02-21 DIAGNOSIS — I130 Hypertensive heart and chronic kidney disease with heart failure and stage 1 through stage 4 chronic kidney disease, or unspecified chronic kidney disease: Secondary | ICD-10-CM | POA: Diagnosis not present

## 2023-02-21 DIAGNOSIS — N182 Chronic kidney disease, stage 2 (mild): Secondary | ICD-10-CM

## 2023-02-21 DIAGNOSIS — I5032 Chronic diastolic (congestive) heart failure: Secondary | ICD-10-CM

## 2023-02-21 DIAGNOSIS — E1122 Type 2 diabetes mellitus with diabetic chronic kidney disease: Secondary | ICD-10-CM | POA: Diagnosis not present

## 2023-02-21 DIAGNOSIS — D6869 Other thrombophilia: Secondary | ICD-10-CM | POA: Diagnosis not present

## 2023-02-21 DIAGNOSIS — Z794 Long term (current) use of insulin: Secondary | ICD-10-CM

## 2023-02-21 DIAGNOSIS — E6609 Other obesity due to excess calories: Secondary | ICD-10-CM

## 2023-02-21 DIAGNOSIS — Z6833 Body mass index (BMI) 33.0-33.9, adult: Secondary | ICD-10-CM

## 2023-02-21 DIAGNOSIS — Z79899 Other long term (current) drug therapy: Secondary | ICD-10-CM

## 2023-02-21 MED ORDER — INSULIN LISPRO (1 UNIT DIAL) 100 UNIT/ML (KWIKPEN)
0.0000 [IU] | PEN_INJECTOR | SUBCUTANEOUS | 0 refills | Status: DC
Start: 2023-02-21 — End: 2023-04-05

## 2023-02-21 NOTE — Progress Notes (Signed)
[name], CMA,acting as a Neurosurgeon for [employer], NP.,have documented all relevant documentation on the behalf of [name] Hose, NP,as directed by  [name] Hose, NP while in the presence of [name] Hose, NP.  Subjective:  Patient ID: [name] , female    DOB: 07/15/1945 , 77 y.o.   MRN: [id number]  Chief Complaint  Patient presents with   Diabetes    HPI  Patient presents today for hyperglycermia. Patient reports compliance with her meds. She reported when she was last here her Toujeo was adjusted and now her sugars have been running high in the 200s and 300s. She does report eating more sweets. Patient states she is taking 3 units of Toujeo, she was advised that she is supposed to use 20 units at night and not 3 units. Patient was given a sliding scale for short acting insulin Humalog and explained the difference because she seem to be mixing it up. She voiced understanding and agreed to place the sliding scale where she can see it always. She was accompanied by her sister.       Hypertension This is a chronic problem. The current episode started more than 1 year ago. The problem has been gradually improving since onset. The problem is controlled. Pertinent negatives include no palpitations. The current treatment provides moderate improvement. Hypertensive end-organ damage includes kidney disease and heart failure. Identifiable causes of hypertension include sleep apnea.     Past Medical History:  Diagnosis Date   Abnormal liver function     in the past.   Anemia    Arthritis    all over   Bruises easily    Cataract    right eye;immature   CHF (congestive heart failure) (HCC)    Chronic back pain    stenosis   Chronic cough    Chronic kidney disease    COPD (chronic obstructive pulmonary disease) (HCC)    COVID    aug/sept 2021   Demand myocardial infarction [facility]) 2012   Demand Infarction in setting of Pancreatitis --> mild Troponin elevation, NON-OBSTRUCTIVE  CAD   Depression    takes Abilify daily as well as Zoloft   Diastolic heart failure 2010   Grade 1 diastolic Dysfunction by Echo    Diverticulosis    DM (diabetes mellitus) (HCC)    takes Victoza daily as well as Lantus and Humalog   Empty sella (HCC)    on MRI in 2009.   Glaucoma    Headache(784.0)    last migraine-4-42yrs ago   History of blood transfusion    no abnormal reaction noted   History of colon polyps    benign   HTN (hypertension)    takes Benicar,Imdur,and Bystolic daily   Hyperlipidemia    takes Lipitor daily   Joint swelling    Nocturia    Obesity hypoventilation syndrome (HCC)    Obstructive sleep apnea 02/2018   Notably improved split-night study with weight loss from 270 (2017) down to 250 pounds (2019).   Pancreatitis    takes Pancrelipase daily   Parkinson's disease    takes Sinemet daily   Peripheral neuropathy    Pneumonia 2012   Seizures (HCC)    takes Lamictal daily and Primidone nightly;last seizure 2wks ago   Urinary urgency    With increased frequency   Varicose veins of both lower extremities with pain    With edema.  Takes daily Lasix     Family History  Problem Relation Age of  Onset   Allergies Father    Heart disease Father        before 73   Heart failure Father    Hypertension Father    Hyperlipidemia Father    Heart disease Mother    Diabetes Mother    Hypertension Mother    Hyperlipidemia Mother    Hypertension Sister    Heart disease Sister        before 72   Hyperlipidemia Sister    Diabetes Brother    Hypertension Brother    Diabetes Sister    Hypertension Sister    Diabetes Son    Hypertension Son    Cancer Other      Current Outpatient Medications:    acetaminophen (TYLENOL) 500 MG tablet, Take 500-1,000 mg by mouth every 4 (four) hours as needed for moderate pain or mild pain., Disp: , Rfl:    albuterol (VENTOLIN HFA) 108 (90 Base) MCG/ACT inhaler, Inhale 1-2 puffs into the lungs every 6 (six) hours as needed  for wheezing or shortness of breath., Disp: 18 g, Rfl: 5   Alcaftadine (LASTACAFT) 0.25 % SOLN, Place 1-2 drops into both eyes daily as needed (allergy eye/irritation.)., Disp: , Rfl:    amiodarone (PACERONE) 100 MG tablet, Take 1 tablet (100 mg total) by mouth daily. (Patient taking differently: Take 50 mg by mouth daily.), Disp: 30 tablet, Rfl: 3   Ascorbic Acid (VITAMIN C) 1000 MG tablet, Take 1,000 mg by mouth in the morning., Disp: , Rfl:    atorvastatin (LIPITOR) 40 MG tablet, Take 1 tablet (40 mg total) by mouth daily., Disp: 90 tablet, Rfl: 3   B-D ULTRAFINE III SHORT PEN 31G X 8 MM MISC, USE DAILY WITH VICTOZA AND/OR NOVOLOG., Disp: 400 each, Rfl: 2   benzonatate (TESSALON) 100 MG capsule, Take 1 capsule by mouth three times daily as needed for cough, Disp: 30 capsule, Rfl: 0   Blood Glucose Monitoring Suppl (TRUE METRIX METER) w/Device KIT, USE AS DIRECTED, Disp: 1 kit, Rfl: 1   carvedilol (COREG) 12.5 MG tablet, TAKE 1 TABLET TWICE DAILY, Disp: 180 tablet, Rfl: 3   diclofenac sodium (VOLTAREN) 1 % GEL, Apply 2 g topically 4 (four) times daily as needed (pain)., Disp: , Rfl:    FeFum-FePoly-FA-B Cmp-C-Biot (INTEGRA PLUS) CAPS, Take 1 capsule by mouth every morning., Disp: 30 capsule, Rfl: 2   ferrous sulfate 324 MG TBEC, Take 324 mg by mouth daily with breakfast., Disp: , Rfl:    furosemide (LASIX) 20 MG tablet, Take 4 tablets (80 mg total) by mouth 2 (two) times daily. (Patient taking differently: Take 80 mg by mouth 2 (two) times daily. Take 4 tablets (80 mg) in  morning and 3 tablets (60 mg) at night.), Disp: 240 tablet, Rfl: 3   Glucagon (GVOKE HYPOPEN 2-PACK) 0.5 MG/0.1ML SOAJ, INJECT 0.5 MG (1 PEN) INTO THE SKIN DAILY AS NEEDED., Disp: 2 mL, Rfl: 11   guaiFENesin-dextromethorphan (ROBITUSSIN DM) 100-10 MG/5ML syrup, Take 10 mLs by mouth every 6 (six) hours as needed for cough., Disp: 118 mL, Rfl: 0   hydrALAZINE (APRESOLINE) 100 MG tablet, Take 1 tablet (100 mg total) by mouth 3  (three) times daily., Disp: 270 tablet, Rfl: 3   hydrocortisone cream 1 %, Apply 1 application topically daily as needed for itching. , Disp: , Rfl:    insulin glargine, 1 Unit Dial, (TOUJEO) 300 UNIT/ML Solostar Pen, Inject 20 Units into the skin at bedtime., Disp: , Rfl:    Isopropyl Alcohol (ALCOHOL  WIPES) 70 % MISC, Apply 1 each topically 2 (two) times daily., Disp: 300 each, Rfl: 3   isosorbide mononitrate (IMDUR) 30 MG 24 hr tablet, Take 1 tablet (30 mg total) by mouth daily., Disp: 90 tablet, Rfl: 3   lamoTRIgine (LAMICTAL) 150 MG tablet, Take 1 tablet (150 mg total) by mouth 2 (two) times daily., Disp: 180 tablet, Rfl: 3   loratadine (CLARITIN) 10 MG tablet, Take 1 tablet (10 mg total) by mouth daily., Disp: 30 tablet, Rfl: 2   oxyCODONE (OXY IR/ROXICODONE) 5 MG immediate release tablet, Take 5 mg by mouth every 6 (six) hours as needed (pain.)., Disp: , Rfl:    OZEMPIC, 1 MG/DOSE, 4 MG/3ML SOPN, INJECT 1MG  UNDER THE SKIN ONE TIME WEEKLY, Disp: 9 mL, Rfl: 3   Pancrelipase, Lip-Prot-Amyl, 25000-79000 units CPEP, Take 2 capsules by mouth in the morning, at noon, and at bedtime., Disp: , Rfl:    potassium chloride SA (KLOR-CON M) 20 MEQ tablet, Take 2 tablets (40 mEq total) by mouth daily. With additional on metolazone days, Disp: 300 tablet, Rfl: 3   sennosides-docusate sodium (SENOKOT-S) 8.6-50 MG tablet, Take 1-2 tablets by mouth at bedtime as needed for constipation., Disp: , Rfl:    sertraline (ZOLOFT) 50 MG tablet, TAKE 1 TABLET EVERY DAY, Disp: 90 tablet, Rfl: 3   tiZANidine (ZANAFLEX) 2 MG tablet, TAKE 1 TABLET AT BEDTIME AS NEEDED, Disp: 30 tablet, Rfl: 3   tolnaftate (TINACTIN) 1 % cream, Apply 1 application topically daily as needed (foot fungus). , Disp: , Rfl:    TRUE METRIX BLOOD GLUCOSE TEST test strip, USE AS DIRECTED TO CHECK BLOOD SUGARS 2 TIMES PER DAY, Disp: 200 strip, Rfl: 10   TRUEplus Lancets 33G MISC, TEST BLOOD SUGAR TWICE DAILY AS DIRECTED, Disp: 200 each, Rfl: 3    ELIQUIS 5 MG TABS tablet, Take 1 tablet by mouth twice daily, Disp: 180 tablet, Rfl: 0   insulin lispro (HUMALOG KWIKPEN) 100 UNIT/ML KwikPen, Inject 0-10 Units into the skin See admin instructions. Sliding scale:Inject  (high blood sugar over 150). 150-199=2 units;200-249=4 units,250-299=6 units;300-349=8 units;350 above 10 units: Over 350 call physician, Disp: 15 mL, Rfl: 0   levETIRAcetam (KEPPRA) 500 MG tablet, TAKE 2 TABLETS EVERY NIGHT, Disp: 180 tablet, Rfl: 0 No current facility-administered medications for this visit.  Facility-Administered Medications Ordered in Other Visits:    0.9 %  sodium chloride infusion (Manually program via Guardrails IV Fluids), , Intravenous, Once, [place], Jessica M, FNP   0.9 %  sodium chloride infusion (Manually program via Guardrails IV Fluids), , Intravenous, Once, [place], Jessica M, FNP   Allergies  Allergen Reactions   Other Anaphylaxis and Rash    Bleach Cigarette smoke/ Cough   Penicillins Hives    Did it involve swelling of the face/tongue/throat, SOB, or low BP? No Did it involve sudden or severe rash/hives, skin peeling, or any reaction on the inside of your mouth or nose? Yes Did you need to seek medical attention at a hospital or doctor's office? Yes When did it last happen?      Childhood allergy  If all above answers are "NO", may proceed with cephalosporin use.     Sulfa Antibiotics Hives   Aspirin Other (See Comments)     On aspirin 81 mg - Rectal bleeding in dec 2018   Codeine     Headache and makes the patient feel "off"     Review of Systems  Constitutional: Negative.   Eyes: Negative.  Cardiovascular:  Negative for palpitations.  Endocrine: Negative for polydipsia, polyphagia and polyuria.  Musculoskeletal: Negative.   Skin: Negative.   Psychiatric/Behavioral:         Seemed a little confused about her insulin     Today's Vitals   02/21/23 1031  BP: 120/60  Pulse: 63  Temp: 98.1 F (36.7 C)  Weight: 177 lb  (80.3 kg)  Height: 5\' 1"  (1.549 m)  PainSc: 0-No pain   Body mass index is 33.44 kg/m.  Wt Readings from Last 3 Encounters:  03/05/23 177 lb (80.3 kg)  02/21/23 177 lb (80.3 kg)  02/18/23 181 lb 3.2 oz (82.2 kg)    The 10-year ASCVD risk score (Arnett DK, et al., 2019) is: 27.4%   Values used to calculate the score:     Age: 65 years     Sex: Female     Is Non-Hispanic African American: Yes     Diabetic: Yes     Tobacco smoker: No     Systolic Blood Pressure: 122 mmHg     Is BP treated: Yes     HDL Cholesterol: 84 mg/dL     Total Cholesterol: 156 mg/dL  Objective:  Physical Exam HENT:     Head: Normocephalic.  Cardiovascular:     Rate and Rhythm: Normal rate.  Pulmonary:     Effort: Pulmonary effort is normal.     Breath sounds: Normal breath sounds.  Skin:    General: Skin is warm and dry.  Neurological:     Mental Status: She is alert. Mental status is at baseline.  Psychiatric:        Mood and Affect: Mood normal.        Behavior: Behavior normal.        Assessment And Plan:  Type 2 diabetes mellitus with stage 2 chronic kidney disease, with long-term current use of insulin (HCC) -     Insulin Lispro (1 Unit Dial); Inject 0-10 Units into the skin See admin instructions. Sliding scale:Inject  (high blood sugar over 150). 150-199=2 units;200-249=4 units,250-299=6 units;300-349=8 units;350 above 10 units: Over 350 call physician  Dispense: 15 mL; Refill: 0  Acquired thrombophilia (HCC) Assessment & Plan: On Eliquis 5mg  BID d/t PAF   Chronic diastolic CHF (congestive heart failure), NYHA class 2 (HCC) Assessment & Plan: No swelling or shortness of breath on exertion noted   Class 1 obesity due to excess calories with serious comorbidity and body mass index (BMI) of 33.0 to 33.9 in adult Assessment & Plan: She is encouraged to strive for BMI less than 30 to decrease cardiac risk. Advised to aim for at least 150 minutes of exercise per week.      Return for  Keep Scheduled Appt.  Patient was given opportunity to ask questions. Patient verbalized understanding of the plan and was able to repeat key elements of the plan. All questions were answered to their satisfaction.    I, [name] Hose, NP, have reviewed all documentation for this visit. The documentation on 03/06/23 for the exam, diagnosis, procedures, and orders are all accurate and complete.   IF YOU HAVE BEEN REFERRED TO A SPECIALIST, IT MAY TAKE 1-2 WEEKS TO SCHEDULE/PROCESS THE REFERRAL. IF YOU HAVE NOT HEARD FROM US/SPECIALIST IN TWO WEEKS, PLEASE GIVE [place] A CALL AT [phone].

## 2023-02-26 ENCOUNTER — Other Ambulatory Visit: Payer: Self-pay | Admitting: Neurology

## 2023-02-27 ENCOUNTER — Other Ambulatory Visit (HOSPITAL_COMMUNITY): Payer: Self-pay | Admitting: Cardiology

## 2023-02-28 DIAGNOSIS — H04123 Dry eye syndrome of bilateral lacrimal glands: Secondary | ICD-10-CM | POA: Diagnosis not present

## 2023-02-28 DIAGNOSIS — H43813 Vitreous degeneration, bilateral: Secondary | ICD-10-CM | POA: Diagnosis not present

## 2023-02-28 LAB — HM DIABETES EYE EXAM

## 2023-03-05 ENCOUNTER — Ambulatory Visit (INDEPENDENT_AMBULATORY_CARE_PROVIDER_SITE_OTHER): Payer: Medicare HMO

## 2023-03-05 VITALS — BP 122/60 | HR 68 | Temp 98.5°F | Ht 61.0 in | Wt 177.0 lb

## 2023-03-05 DIAGNOSIS — Z23 Encounter for immunization: Secondary | ICD-10-CM | POA: Diagnosis not present

## 2023-03-05 NOTE — Progress Notes (Signed)
Patient presents today for flu shot.  

## 2023-03-06 DIAGNOSIS — D6869 Other thrombophilia: Secondary | ICD-10-CM | POA: Insufficient documentation

## 2023-03-06 NOTE — Assessment & Plan Note (Signed)
No swelling or shortness of breath on exertion noted

## 2023-03-06 NOTE — Assessment & Plan Note (Signed)
On Eliquis 5mg  BID d/t PAF

## 2023-03-06 NOTE — Assessment & Plan Note (Signed)
She is encouraged to strive for BMI less than 30 to decrease cardiac risk. Advised to aim for at least 150 minutes of exercise per week.  

## 2023-03-12 ENCOUNTER — Telehealth: Payer: Self-pay | Admitting: Pulmonary Disease

## 2023-03-12 NOTE — Telephone Encounter (Signed)
Patient is currently using a Cpap machine but the pressure is too high. She called the company and they advised her that she needs a prescription to get the pressure lowered. Please call and advise [phone]

## 2023-03-15 ENCOUNTER — Telehealth: Payer: Self-pay | Admitting: Internal Medicine

## 2023-03-15 NOTE — Telephone Encounter (Signed)
Called patient regarding upcoming October appointments, patient is notified.

## 2023-04-02 ENCOUNTER — Other Ambulatory Visit: Payer: Self-pay

## 2023-04-02 ENCOUNTER — Emergency Department (HOSPITAL_COMMUNITY): Payer: Medicare HMO

## 2023-04-02 ENCOUNTER — Emergency Department (HOSPITAL_COMMUNITY)
Admission: EM | Admit: 2023-04-02 | Discharge: 2023-04-02 | Disposition: A | Discharge status: Home / Self Care | Payer: Medicare HMO | Attending: Emergency Medicine | Admitting: Emergency Medicine

## 2023-04-02 DIAGNOSIS — Z7901 Long term (current) use of anticoagulants: Secondary | ICD-10-CM | POA: Diagnosis not present

## 2023-04-02 DIAGNOSIS — I672 Cerebral atherosclerosis: Secondary | ICD-10-CM | POA: Diagnosis not present

## 2023-04-02 DIAGNOSIS — R410 Disorientation, unspecified: Secondary | ICD-10-CM | POA: Diagnosis not present

## 2023-04-02 DIAGNOSIS — R569 Unspecified convulsions: Secondary | ICD-10-CM

## 2023-04-02 DIAGNOSIS — G40909 Epilepsy, unspecified, not intractable, without status epilepticus: Secondary | ICD-10-CM | POA: Insufficient documentation

## 2023-04-02 DIAGNOSIS — Z794 Long term (current) use of insulin: Secondary | ICD-10-CM | POA: Insufficient documentation

## 2023-04-02 DIAGNOSIS — G20A1 Parkinson's disease without dyskinesia, without mention of fluctuations: Secondary | ICD-10-CM | POA: Diagnosis not present

## 2023-04-02 DIAGNOSIS — I5030 Unspecified diastolic (congestive) heart failure: Secondary | ICD-10-CM | POA: Diagnosis not present

## 2023-04-02 DIAGNOSIS — R42 Dizziness and giddiness: Secondary | ICD-10-CM | POA: Insufficient documentation

## 2023-04-02 DIAGNOSIS — I959 Hypotension, unspecified: Secondary | ICD-10-CM | POA: Diagnosis not present

## 2023-04-02 DIAGNOSIS — I6782 Cerebral ischemia: Secondary | ICD-10-CM | POA: Diagnosis not present

## 2023-04-02 DIAGNOSIS — I4891 Unspecified atrial fibrillation: Secondary | ICD-10-CM | POA: Diagnosis not present

## 2023-04-02 DIAGNOSIS — I63511 Cerebral infarction due to unspecified occlusion or stenosis of right middle cerebral artery: Secondary | ICD-10-CM | POA: Diagnosis not present

## 2023-04-02 LAB — CBC WITH DIFFERENTIAL/PLATELET
Abs Immature Granulocytes: 0.01 10*3/uL (ref 0.00–0.07)
Basophils Absolute: 0.1 10*3/uL (ref 0.0–0.1)
Basophils Relative: 1 %
Eosinophils Absolute: 0.4 10*3/uL (ref 0.0–0.5)
Eosinophils Relative: 7 %
HCT: 26.7 % — ABNORMAL LOW (ref 36.0–46.0)
Hemoglobin: 8.3 g/dL — ABNORMAL LOW (ref 12.0–15.0)
Immature Granulocytes: 0 %
Lymphocytes Relative: 17 %
Lymphs Abs: 0.9 10*3/uL (ref 0.7–4.0)
MCH: 22.7 pg — ABNORMAL LOW (ref 26.0–34.0)
MCHC: 31.1 g/dL (ref 30.0–36.0)
MCV: 73.2 fL — ABNORMAL LOW (ref 80.0–100.0)
Monocytes Absolute: 0.5 10*3/uL (ref 0.1–1.0)
Monocytes Relative: 10 %
Neutro Abs: 3.4 10*3/uL (ref 1.7–7.7)
Neutrophils Relative %: 65 %
Platelets: 292 10*3/uL (ref 150–400)
RBC: 3.65 MIL/uL — ABNORMAL LOW (ref 3.87–5.11)
RDW: 16.7 % — ABNORMAL HIGH (ref 11.5–15.5)
WBC: 5.3 10*3/uL (ref 4.0–10.5)
nRBC: 0 % (ref 0.0–0.2)

## 2023-04-02 LAB — URINALYSIS, ROUTINE W REFLEX MICROSCOPIC
Bacteria, UA: NONE SEEN
Bilirubin Urine: NEGATIVE
Glucose, UA: NEGATIVE mg/dL
Hgb urine dipstick: NEGATIVE
Ketones, ur: NEGATIVE mg/dL
Nitrite: NEGATIVE
Protein, ur: NEGATIVE mg/dL
Specific Gravity, Urine: 1.018 (ref 1.005–1.030)
pH: 5 (ref 5.0–8.0)

## 2023-04-02 LAB — COMPREHENSIVE METABOLIC PANEL
ALT: 20 U/L (ref 0–44)
AST: 34 U/L (ref 15–41)
Albumin: 3.2 g/dL — ABNORMAL LOW (ref 3.5–5.0)
Alkaline Phosphatase: 103 U/L (ref 38–126)
Anion gap: 9 (ref 5–15)
BUN: 14 mg/dL (ref 8–23)
CO2: 27 mmol/L (ref 22–32)
Calcium: 8.7 mg/dL — ABNORMAL LOW (ref 8.9–10.3)
Chloride: 102 mmol/L (ref 98–111)
Creatinine, Ser: 1.11 mg/dL — ABNORMAL HIGH (ref 0.44–1.00)
GFR, Estimated: 52 mL/min — ABNORMAL LOW (ref >60)
Glucose, Bld: 100 mg/dL — ABNORMAL HIGH (ref 70–99)
Potassium: 4 mmol/L (ref 3.5–5.1)
Sodium: 138 mmol/L (ref 135–145)
Total Bilirubin: 0.7 mg/dL (ref 0.3–1.2)
Total Protein: 7.1 g/dL (ref 6.5–8.1)

## 2023-04-02 LAB — CBG MONITORING, ED: Glucose-Capillary: 103 mg/dL — ABNORMAL HIGH (ref 70–99)

## 2023-04-02 MED ORDER — LEVETIRACETAM IN NACL 1000 MG/100ML IV SOLN
1000.0000 mg | Freq: Once | INTRAVENOUS | Status: AC
  Administered 2023-04-02: 1000 mg via INTRAVENOUS
  Filled 2023-04-02: qty 100

## 2023-04-02 NOTE — ED Provider Notes (Signed)
[place] EMERGENCY DEPARTMENT AT [facility] Provider Note   CSN: [id number] Arrival date & time: 04/02/23  2107     History  Chief Complaint  Patient presents with   Seizures    [name] is a 77 y.o. female.   Seizures    Patient has a history of diastolic heart failure hyperlipidemia obstructive sleep apnea, pancreatitis, Parkinson's disease, seizure disorder who presents to the ED for evaluation after a seizure.  History was provided by both the patient and the family.  Patient states she remembers cooking and starting to feel lightheaded.  She felt like she needed to sit down.  Patient states she was able to make it to the chair and that is the last thing she remembers.  Patient's niece was called to check on the patient.  She found her having seizure-like activity.  .  She would have episodes where she was not shaking but then would begin shaking again.Overall this lasted for about 10 minutes.  Patient's niece states that she was confused afterwards but eventually has returned to her baseline.  Patient now states she feels a little bit fatigued.  She is not having any headache.  No chest pain.  No numbness or weakness.  She has been taking her medications but did not take her Keppra this evening  Home Medications Prior to Admission medications   Medication Sig Start Date End Date Taking? Authorizing Provider  acetaminophen (TYLENOL) 500 MG tablet Take 500-1,000 mg by mouth every 4 (four) hours as needed for moderate pain or mild pain.    [provider]  albuterol (VENTOLIN HFA) 108 (90 Base) MCG/ACT inhaler Inhale 1-2 puffs into the lungs every 6 (six) hours as needed for wheezing or shortness of breath. 06/08/21   Mannam, Colbert Coyer, MD  Alcaftadine (LASTACAFT) 0.25 % SOLN Place 1-2 drops into both eyes daily as needed (allergy eye/irritation.).    [provider]  amiodarone (PACERONE) 100 MG tablet Take 1 tablet (100 mg total) by mouth  daily. Patient taking differently: Take 50 mg by mouth daily. 02/18/23   Jacklynn Ganong, FNP  Ascorbic Acid (VITAMIN C) 1000 MG tablet Take 1,000 mg by mouth in the morning.    [provider]  atorvastatin (LIPITOR) 40 MG tablet Take 1 tablet (40 mg total) by mouth daily. 11/28/22   [name], MD  B-D ULTRAFINE III SHORT PEN 31G X 8 MM MISC USE DAILY WITH VICTOZA AND/OR NOVOLOG. 09/25/21   [name], MD  benzonatate (TESSALON) 100 MG capsule Take 1 capsule by mouth three times daily as needed for cough 02/19/23   [name], MD  Blood Glucose Monitoring Suppl (TRUE METRIX METER) w/Device KIT USE AS DIRECTED 10/04/22   [name], MD  carvedilol (COREG) 12.5 MG tablet TAKE 1 TABLET TWICE DAILY 08/08/22   Laurey Morale, MD  diclofenac sodium (VOLTAREN) 1 % GEL Apply 2 g topically 4 (four) times daily as needed (pain). 05/17/18   Joseph Art, DO  ELIQUIS 5 MG TABS tablet Take 1 tablet by mouth twice daily 02/28/23   Laurey Morale, MD  FeFum-FePoly-FA-B Cmp-C-Biot (INTEGRA PLUS) CAPS Take 1 capsule by mouth every morning. 10/22/22   Heilingoetter, Cassandra L, PA-C  ferrous sulfate 324 MG TBEC Take 324 mg by mouth daily with breakfast.    [provider]  furosemide (LASIX) 20 MG tablet Take 4 tablets (80 mg total) by mouth 2 (two) times daily. Patient taking differently: Take 80 mg  by mouth 2 (two) times daily. Take 4 tablets (80 mg) in  morning and 3 tablets (60 mg) at night. 01/25/23   Robbie Lis M, PA-C  Glucagon (GVOKE HYPOPEN 2-PACK) 0.5 MG/0.1ML SOAJ INJECT 0.5 MG (1 PEN) INTO THE SKIN DAILY AS NEEDED. 09/03/22   [name], MD  guaiFENesin-dextromethorphan [facility] DM) 100-10 MG/5ML syrup Take 10 mLs by mouth every 6 (six) hours as needed for cough. 11/18/20   [place], Alvira Philips, DO  hydrALAZINE (APRESOLINE) 100 MG tablet Take 1 tablet (100 mg total) by mouth 3 (three) times daily. 05/04/21   Laurey Morale, MD  hydrocortisone cream 1 % Apply 1  application topically daily as needed for itching.     [provider]  insulin glargine, 1 Unit Dial, (TOUJEO) 300 UNIT/ML Solostar Pen Inject 20 Units into the skin at bedtime.    [provider]  insulin lispro (HUMALOG KWIKPEN) 100 UNIT/ML KwikPen Inject 0-10 Units into the skin See admin instructions. Sliding scale:Inject  (high blood sugar over 150). 150-199=2 units;200-249=4 units,250-299=6 units;300-349=8 units;350 above 10 units: Over 350 call physician 02/21/23   [name], NP  Isopropyl Alcohol (ALCOHOL WIPES) 70 % MISC Apply 1 each topically 2 (two) times daily. 06/25/19   Arnette Felts, FNP  isosorbide mononitrate (IMDUR) 30 MG 24 hr tablet Take 1 tablet (30 mg total) by mouth daily. 10/15/22   Tonny Bollman, MD  lamoTRIgine (LAMICTAL) 150 MG tablet Take 1 tablet (150 mg total) by mouth 2 (two) times daily. 04/20/22   Van Clines, MD  levETIRAcetam (KEPPRA) 500 MG tablet TAKE 2 TABLETS EVERY NIGHT 02/27/23   Van Clines, MD  loratadine (CLARITIN) 10 MG tablet Take 1 tablet (10 mg total) by mouth daily. 10/01/22 10/01/23  [name], MD  oxyCODONE (OXY IR/ROXICODONE) 5 MG immediate release tablet Take 5 mg by mouth every 6 (six) hours as needed (pain.). 11/01/22   [provider]  OZEMPIC, 1 MG/DOSE, 4 MG/3ML SOPN INJECT 1MG  UNDER THE SKIN ONE TIME WEEKLY 09/19/22   [name], MD  Pancrelipase, Lip-Prot-Amyl, 25000-79000 units CPEP Take 2 capsules by mouth in the morning, at noon, and at bedtime.    [provider]  potassium chloride SA (KLOR-CON M) 20 MEQ tablet Take 2 tablets (40 mEq total) by mouth daily. With additional on metolazone days 08/22/22   Laurey Morale, MD  sennosides-docusate sodium (SENOKOT-S) 8.6-50 MG tablet Take 1-2 tablets by mouth at bedtime as needed for constipation.    [provider]  sertraline (ZOLOFT) 50 MG tablet TAKE 1 TABLET EVERY DAY 10/22/22   [name], MD  tiZANidine (ZANAFLEX) 2 MG  tablet TAKE 1 TABLET AT BEDTIME AS NEEDED 07/16/22   [name], MD  tolnaftate (TINACTIN) 1 % cream Apply 1 application topically daily as needed (foot fungus).     [provider]  TRUE METRIX BLOOD GLUCOSE TEST test strip USE AS DIRECTED TO CHECK BLOOD SUGARS 2 TIMES PER DAY 04/25/22   [name], MD  TRUEplus Lancets 33G MISC TEST BLOOD SUGAR TWICE DAILY AS DIRECTED 11/11/21   [name], MD      Allergies    Other, Penicillins, Sulfa antibiotics, Aspirin, and Codeine    Review of Systems   Review of Systems  Neurological:  Positive for seizures.    Physical Exam Updated Vital Signs BP (!) 128/58   Pulse (!) 55   Temp 98.2 F (36.8 C) (Oral)   Resp 16   Ht 1.549  m (5\' 1" )   Wt 81.2 kg   SpO2 100%   BMI 33.82 kg/m  Physical Exam Vitals and nursing note reviewed.  Constitutional:      General: She is not in acute distress.    Appearance: She is well-developed.  HENT:     Head: Normocephalic and atraumatic.     Right Ear: External ear normal.     Left Ear: External ear normal.  Eyes:     General: No scleral icterus.       Right eye: No discharge.        Left eye: No discharge.     Conjunctiva/sclera: Conjunctivae normal.  Neck:     Trachea: No tracheal deviation.  Cardiovascular:     Rate and Rhythm: Normal rate and regular rhythm.  Pulmonary:     Effort: Pulmonary effort is normal. No respiratory distress.     Breath sounds: Normal breath sounds. No stridor. No wheezing or rales.  Abdominal:     General: Bowel sounds are normal. There is no distension.     Palpations: Abdomen is soft.     Tenderness: There is no abdominal tenderness. There is no guarding or rebound.  Musculoskeletal:        General: No tenderness or deformity.     Cervical back: Neck supple.  Skin:    General: Skin is warm and dry.     Findings: No rash.  Neurological:     General: No focal deficit present.     Mental Status: She is alert.     Cranial Nerves: No cranial  nerve deficit, dysarthria or facial asymmetry.     Sensory: No sensory deficit.     Motor: No abnormal muscle tone or seizure activity.     Coordination: Coordination normal.  Psychiatric:        Mood and Affect: Mood normal.     ED Results / Procedures / Treatments   Labs (all labs ordered are listed, but only abnormal results are displayed) Labs Reviewed  COMPREHENSIVE METABOLIC PANEL - Abnormal; Notable for the following components:      Result Value   Glucose, Bld 100 (*)    Creatinine, Ser 1.11 (*)    Calcium 8.7 (*)    Albumin 3.2 (*)    GFR, Estimated 52 (*)    All other components within normal limits  CBC WITH DIFFERENTIAL/PLATELET - Abnormal; Notable for the following components:   RBC 3.65 (*)    Hemoglobin 8.3 (*)    HCT 26.7 (*)    MCV 73.2 (*)    MCH 22.7 (*)    RDW 16.7 (*)    All other components within normal limits  URINALYSIS, ROUTINE W REFLEX MICROSCOPIC - Abnormal; Notable for the following components:   Leukocytes,Ua TRACE (*)    All other components within normal limits  CBG MONITORING, ED - Abnormal; Notable for the following components:   Glucose-Capillary 103 (*)    All other components within normal limits  LAMOTRIGINE LEVEL  LEVETIRACETAM LEVEL    EKG None  Radiology CT HEAD WO CONTRAST  Result Date: 04/02/2023 CLINICAL DATA:  Seizure disorder, clinical change EXAM: CT HEAD WITHOUT CONTRAST TECHNIQUE: Contiguous axial images were obtained from the base of the skull through the vertex without intravenous contrast. RADIATION DOSE REDUCTION: This exam was performed according to the departmental dose-optimization program which includes automated exposure control, adjustment of the mA and/or kV according to patient size and/or use of iterative reconstruction technique. COMPARISON:  Head  CT 07/05/2022, brain MRI 07/06/2022 FINDINGS: Brain: No intracranial hemorrhage, mass effect, or midline shift. Stable brain volume. No hydrocephalus. The basilar  cisterns are patent. Again seen small remote infarct posterior right frontal lobe. Similar mild chronic small vessel ischemic change. No evidence of territorial infarct or acute ischemia. Empty sella, chronic. No extra-axial or intracranial fluid collection. Vascular: Atherosclerosis of skullbase vasculature without hyperdense vessel or abnormal calcification. Skull: No acute findings. Sinuses/Orbits: Paranasal sinuses and mastoid air cells are clear. The visualized orbits are unremarkable. Bilateral cataract resection Other: None. IMPRESSION: 1. No acute intracranial abnormality. 2. Unchanged chronic small vessel ischemic change and small remote right frontal infarct. Electronically Signed   By: Narda Rutherford M.D.   On: 04/02/2023 22:18    Procedures Procedures    Medications Ordered in ED Medications  levETIRAcetam (KEPPRA) IVPB 1000 mg/100 mL premix (0 mg Intravenous Stopped 04/02/23 2222)    ED Course/ Medical Decision Making/ A&P Clinical Course as of 04/02/23 2326  Tue Apr 02, 2023  2253 CBC with Differential/Platelet(!) Anemia noted.  Hemoglobin slightly decreased compared to previous. [JK]  2253 No significant electrolyte abnormalities noted.  Urinalysis negative. [JK]  2253 Head CT without acute findings [JK]  2311 Urinalysis, Routine w reflex microscopic -Urine, Clean Catch(!) normal [JK]    Clinical Course User Index [JK] Linwood Dibbles, MD                                 Medical Decision Making Problems Addressed: Seizure [facility]): acute illness or injury that poses a threat to life or bodily functions  Amount and/or Complexity of Data Reviewed Labs: ordered. Decision-making details documented in ED Course. Radiology: ordered and independent interpretation performed.  Risk Prescription drug management.   Patient presents to the ED for evaluation after a seizure.  Patient has known history of seizures.  Family describes tonic-clonic activity and a postictal phase.  Patient  has returned to her baseline.  No recurrent seizures.  No clear etiology precipitating her seizures at this time.  No signs of infection.  Patient states she has been compliant with her medications.  Patient has returned to baseline and has been monitored in the ED.  Will have her continue her current regimen.  I will send off a Lamictal and Keppra level.  Patient is comfortable going home with family.  They will have her follow-up closely with her neurologist.        Final Clinical Impression(s) / ED Diagnoses Final diagnoses:  Seizure [facility])    Rx / DC Orders ED Discharge Orders     None         Linwood Dibbles, MD 04/02/23 2326

## 2023-04-02 NOTE — Discharge Instructions (Signed)
Continue your current medications.  Follow-up with your neurologist to be rechecked.  Return to the ED for any recurrent episodes

## 2023-04-02 NOTE — ED Notes (Signed)
Pt stood and turned to bedside commode

## 2023-04-02 NOTE — ED Triage Notes (Signed)
Pt from home via ems. Pt family states pt had multiple seizures while at the dinner table. Family laid pt on floor and she had 1 more 1 minute seizure with fire dept on scene. Pt states has not had seizures for years. Takes lamical as prescribed. BS78 after full mean and insulin beforehand. Hz chf, dm2, seizures, copd, afib. Pt a and o x 4.

## 2023-04-03 ENCOUNTER — Encounter: Payer: Self-pay | Admitting: Pulmonary Disease

## 2023-04-03 ENCOUNTER — Other Ambulatory Visit: Payer: Self-pay | Admitting: Internal Medicine

## 2023-04-03 ENCOUNTER — Ambulatory Visit: Payer: Medicare HMO | Admitting: Pulmonary Disease

## 2023-04-03 VITALS — BP 148/68 | HR 64 | Ht 61.0 in | Wt 177.0 lb

## 2023-04-03 DIAGNOSIS — R053 Chronic cough: Secondary | ICD-10-CM | POA: Diagnosis not present

## 2023-04-03 DIAGNOSIS — G4733 Obstructive sleep apnea (adult) (pediatric): Secondary | ICD-10-CM

## 2023-04-03 DIAGNOSIS — D500 Iron deficiency anemia secondary to blood loss (chronic): Secondary | ICD-10-CM

## 2023-04-03 NOTE — Progress Notes (Addendum)
[name]    [id number]    12/23/1945  Primary Care Physician:Sanders, Melina Schools, MD  Referring Physician: [name], MD [address] [place],  [place] [address]  Chief complaint: Follow-up for post COVID-19  HPI: 77 y.o.  with OSA.  Developed COVID-19 at the end of August.  Treated with monoclonal antibody on 02/23/2020. She was treated at home with remote health with dexamethasone, azithromycin, supplemental oxygen. She had a rough first week but feels that she is slowly improving.  Continues to have cough with white mucus  Supplemental oxygen stopped in December 2021 due to continued improvement  Hospitalized from 5/22 to 11/18/2020 for acute respiratory failure secondary to bronchitis. She had a CT of the chest which showed   She was evaluated in the ED in July 2023 for unresponsiveness, code stroke.  MRI, CT head and CTA of head and neck were unremarkable.  There is no evidence of seizures on EEG.  Chest x-ray during that admission showed mild cardiomegaly with no acute lung abnormalities.  chronic changes and no acute infiltrate.  She was treated with antibiotics, steroids.  Ultimately treated for heart failure with Lasix.  Taken off Breo inhaler in 2022.  She had been doing well off that  Pets: Has dogs, no cats, birds, farm animals Occupation: Retired Agricultural engineer Exposures: No known exposures.  No mold, hot tub, Jacuzzi Smoking history: 13-pack-year smoker.  Quit in 1990 Travel history: Previously lived in [place].  No significant recent travel Relevant family history: No significant family history of lung disease  Interim history: Discussed the use of AI scribe software for clinical note transcription with the patient, who gave verbal consent to proceed.  The patient, with a known history of seizures, asthma, and sleep apnea, presents after experiencing four seizures 'back to back' the previous night, requiring an emergency department  visit. She reports that it has been a 'long time' since her last seizure. She is currently taking Keppra and Lamictal twice daily for seizure control.  In addition to the seizures, the patient has been experiencing coughing and mucus production, which she attributes to sinus congestion. She has been using Albuterol two to three times daily to manage these symptoms, but notes that this is a temporary increase in usage due to the current symptoms.  The patient also reports issues with her CPAP machine, which she uses for sleep apnea. She recently switched from a mask to nasal pillows due to soreness, but has found the air pressure to be too strong. She is also in need of a new hose for the machine.  The patient also mentions a history of heart procedures, including one to close a hole in her heart and the placement of an hourglass-shaped device in her heart and lung. She is currently monitoring these devices at home and sending readings to her doctor's office every other day.   Outpatient Encounter Medications as of 04/03/2023  Medication Sig   acetaminophen (TYLENOL) 500 MG tablet Take 500-1,000 mg by mouth every 4 (four) hours as needed for moderate pain or mild pain.   albuterol (VENTOLIN HFA) 108 (90 Base) MCG/ACT inhaler Inhale 1-2 puffs into the lungs every 6 (six) hours as needed for wheezing or shortness of breath.   Alcaftadine (LASTACAFT) 0.25 % SOLN Place 1-2 drops into both eyes daily as needed (allergy eye/irritation.).   amiodarone (PACERONE) 100 MG tablet Take 1 tablet (100 mg total) by mouth daily. (Patient taking  differently: Take 50 mg by mouth daily.)   Ascorbic Acid (VITAMIN C) 1000 MG tablet Take 1,000 mg by mouth in the morning.   atorvastatin (LIPITOR) 40 MG tablet Take 1 tablet (40 mg total) by mouth daily.   B-D ULTRAFINE III SHORT PEN 31G X 8 MM MISC USE DAILY WITH VICTOZA AND/OR NOVOLOG.   benzonatate (TESSALON) 100 MG capsule Take 1 capsule by mouth three times daily as  needed for cough   Blood Glucose Monitoring Suppl (TRUE METRIX METER) w/Device KIT USE AS DIRECTED   carvedilol (COREG) 12.5 MG tablet TAKE 1 TABLET TWICE DAILY   diclofenac sodium (VOLTAREN) 1 % GEL Apply 2 g topically 4 (four) times daily as needed (pain).   ELIQUIS 5 MG TABS tablet Take 1 tablet by mouth twice daily   FeFum-FePoly-FA-B Cmp-C-Biot (INTEGRA PLUS) CAPS Take 1 capsule by mouth every morning.   ferrous sulfate 324 MG TBEC Take 324 mg by mouth daily with breakfast.   furosemide (LASIX) 20 MG tablet Take 4 tablets (80 mg total) by mouth 2 (two) times daily. (Patient taking differently: Take 80 mg by mouth 2 (two) times daily. Take 4 tablets (80 mg) in  morning and 3 tablets (60 mg) at night.)   Glucagon (GVOKE HYPOPEN 2-PACK) 0.5 MG/0.1ML SOAJ INJECT 0.5 MG (1 PEN) INTO THE SKIN DAILY AS NEEDED.   guaiFENesin-dextromethorphan (ROBITUSSIN DM) 100-10 MG/5ML syrup Take 10 mLs by mouth every 6 (six) hours as needed for cough.   hydrALAZINE (APRESOLINE) 100 MG tablet Take 1 tablet (100 mg total) by mouth 3 (three) times daily.   hydrocortisone cream 1 % Apply 1 application topically daily as needed for itching.    insulin glargine, 1 Unit Dial, (TOUJEO) 300 UNIT/ML Solostar Pen Inject 20 Units into the skin at bedtime.   insulin lispro (HUMALOG KWIKPEN) 100 UNIT/ML KwikPen Inject 0-10 Units into the skin See admin instructions. Sliding scale:Inject  (high blood sugar over 150). 150-199=2 units;200-249=4 units,250-299=6 units;300-349=8 units;350 above 10 units: Over 350 call physician   Isopropyl Alcohol (ALCOHOL WIPES) 70 % MISC Apply 1 each topically 2 (two) times daily.   isosorbide mononitrate (IMDUR) 30 MG 24 hr tablet Take 1 tablet (30 mg total) by mouth daily.   lamoTRIgine (LAMICTAL) 150 MG tablet Take 1 tablet (150 mg total) by mouth 2 (two) times daily.   levETIRAcetam (KEPPRA) 500 MG tablet TAKE 2 TABLETS EVERY NIGHT   loratadine (CLARITIN) 10 MG tablet Take 1 tablet (10 mg total)  by mouth daily.   oxyCODONE (OXY IR/ROXICODONE) 5 MG immediate release tablet Take 5 mg by mouth every 6 (six) hours as needed (pain.).   OZEMPIC, 1 MG/DOSE, 4 MG/3ML SOPN INJECT 1MG  UNDER THE SKIN ONE TIME WEEKLY   Pancrelipase, Lip-Prot-Amyl, 25000-79000 units CPEP Take 2 capsules by mouth in the morning, at noon, and at bedtime.   potassium chloride SA (KLOR-CON M) 20 MEQ tablet Take 2 tablets (40 mEq total) by mouth daily. With additional on metolazone days   sennosides-docusate sodium (SENOKOT-S) 8.6-50 MG tablet Take 1-2 tablets by mouth at bedtime as needed for constipation.   sertraline (ZOLOFT) 50 MG tablet TAKE 1 TABLET EVERY DAY   tiZANidine (ZANAFLEX) 2 MG tablet TAKE 1 TABLET AT BEDTIME AS NEEDED   tolnaftate (TINACTIN) 1 % cream Apply 1 application topically daily as needed (foot fungus).    TRUE METRIX BLOOD GLUCOSE TEST test strip USE AS DIRECTED TO CHECK BLOOD SUGARS 2 TIMES PER DAY   TRUEplus Lancets 33G  MISC TEST BLOOD SUGAR TWICE DAILY AS DIRECTED   Facility-Administered Encounter Medications as of 04/03/2023  Medication   0.9 %  sodium chloride infusion (Manually program via Guardrails IV Fluids)   0.9 %  sodium chloride infusion (Manually program via Guardrails IV Fluids)   Physical Exam: Blood pressure 118/72, pulse (!) 57, height 5\' 1"  (1.549 m), weight 176 lb (79.8 kg), SpO2 97 %. Gen:      No acute distress HEENT:  EOMI, sclera anicteric Neck:     No masses; no thyromegaly Lungs:    Clear to auscultation bilaterally; normal respiratory effort CV:         Regular rate and rhythm; no murmurs Abd:      + bowel sounds; soft, non-tender; no palpable masses, no distension Ext:    No edema; adequate peripheral perfusion Skin:      Warm and dry; no rash Neuro: alert and oriented x 3 Psych: normal mood and affect   Data Reviewed: Imaging: Chest x-ray 08/01/2019-low lung volumes with bibasal atelectasis, borderline cardiomegaly.  High-res CT 03/29/2020-scattered  areas of peripheral groundglass opacity, 8 mm right upper lobe nodule CT chest 11/17/2020-subsegmental atelectasis at the base Chest x-ray 12/28/2021-cardiomegaly, no acute airspace disease. I have reviewed the images personally.  PFTs: 10/26/2019 FVC 1.52 [75%], FEV1 1.29 [83%], F/F 85, TLC 3.77 [81%], DLCO 16.54 [94%] Normal test  Labs: CBC 04/10/2021-WBC 5.3, eos 3%, absolute eosinophil count   Assessment and Plan Seizure Disorder Recent episode of four back-to-back seizures requiring ED visit. Patient has been compliant with Keppra and Lamictal. No clear precipitating factors identified. Follow-up with neurologist scheduled in two weeks. -Continue Keppra and Lamictal as prescribed. -Attend scheduled neurology appointment.  Chronic Cough Likely secondary to sinus congestion and post-nasal drip. Increased use of Albuterol noted. Patient also reports difficulty with CPAP use due to strong air flow and discomfort. -Continue Albuterol as needed. -Order new long hose for CPAP machine. -Check CPAP settings and adjust if necessary. -Consider over-the-counter allergy medications like Claritin for sinus congestion.  Sleep Apnea  Patient recently switched from mask to nasal pillows for CPAP but reports difficulty due to strong air flow. Patient also needs a new long hose for CPAP machine. -Order new long hose for CPAP machine. -Check CPAP settings and adjust if necessary.  Asthma Has presumptive asthma as she has recurrent bronchitis and sensitivity to strong perfumes though there is no obstruction on PFTs Currently off controller medication  -Continue Albuterol as needed.   Pulm HTN.  Follows with Dr. 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

## 2023-04-03 NOTE — Patient Instructions (Signed)
VISIT SUMMARY:  During your visit, we discussed your recent seizures, chronic cough, sleep apnea, and asthma. You had four seizures in a row, which required an emergency department visit. You've also been coughing more and producing mucus, which you believe is due to sinus congestion. You've been using your Albuterol inhaler more frequently to manage these symptoms. You've had some issues with your CPAP machine for sleep apnea, including a strong air pressure and a need for a new hose. You also mentioned your history of heart procedures and the devices you're monitoring at home.  YOUR PLAN:  -SEIZURE DISORDER: You had a recent episode of multiple seizures. We will continue your current medications, Keppra and Lamictal, and you have a follow-up appointment with your neurologist in two weeks.  -CHRONIC COUGH: Your cough is likely due to sinus congestion and post-nasal drip. Continue using Albuterol as needed, and we will order a new hose for your CPAP machine. We will also check the settings on your CPAP machine and adjust them if necessary. You might also consider using over-the-counter allergy medications like Claritin for your sinus congestion.  -SLEEP APNEA: You've had some issues with your CPAP machine since switching from a mask to nasal pillows. We will order a new hose for your machine and check the settings to see if they need to be adjusted.  -ASTHMA: You've been using your Albuterol as needed for your asthma. Continue this regimen.  INSTRUCTIONS:  Please continue to take your medications as prescribed. Attend your scheduled neurology appointment in two weeks. We will order a new hose for your CPAP machine and check the settings. Consider using over-the-counter allergy medications like Claritin for your sinus congestion. Continue to monitor your heart devices at home and send readings to the doctor's office every other day.

## 2023-04-04 ENCOUNTER — Other Ambulatory Visit: Payer: Medicare HMO

## 2023-04-04 ENCOUNTER — Ambulatory Visit: Payer: Medicare HMO | Admitting: Internal Medicine

## 2023-04-04 ENCOUNTER — Other Ambulatory Visit: Payer: Self-pay | Admitting: Internal Medicine

## 2023-04-04 ENCOUNTER — Inpatient Hospital Stay: Payer: Medicare HMO | Admitting: Internal Medicine

## 2023-04-04 ENCOUNTER — Inpatient Hospital Stay: Payer: Medicare HMO | Attending: Physician Assistant

## 2023-04-04 VITALS — BP 147/64 | HR 62 | Temp 98.0°F | Resp 16 | Ht 61.0 in | Wt 182.3 lb

## 2023-04-04 DIAGNOSIS — D509 Iron deficiency anemia, unspecified: Secondary | ICD-10-CM | POA: Diagnosis present

## 2023-04-04 DIAGNOSIS — Z79899 Other long term (current) drug therapy: Secondary | ICD-10-CM | POA: Insufficient documentation

## 2023-04-04 DIAGNOSIS — J449 Chronic obstructive pulmonary disease, unspecified: Secondary | ICD-10-CM | POA: Insufficient documentation

## 2023-04-04 DIAGNOSIS — K922 Gastrointestinal hemorrhage, unspecified: Secondary | ICD-10-CM | POA: Insufficient documentation

## 2023-04-04 DIAGNOSIS — D500 Iron deficiency anemia secondary to blood loss (chronic): Secondary | ICD-10-CM | POA: Insufficient documentation

## 2023-04-04 LAB — CMP (CANCER CENTER ONLY)
ALT: 23 U/L (ref 0–44)
AST: 29 U/L (ref 15–41)
Albumin: 3.9 g/dL (ref 3.5–5.0)
Alkaline Phosphatase: 124 U/L (ref 38–126)
Anion gap: 6 (ref 5–15)
BUN: 13 mg/dL (ref 8–23)
CO2: 31 mmol/L (ref 22–32)
Calcium: 9.2 mg/dL (ref 8.9–10.3)
Chloride: 98 mmol/L (ref 98–111)
Creatinine: 0.97 mg/dL (ref 0.44–1.00)
GFR, Estimated: >60 mL/min (ref >60)
Glucose, Bld: 172 mg/dL — ABNORMAL HIGH (ref 70–99)
Potassium: 3.8 mmol/L (ref 3.5–5.1)
Sodium: 135 mmol/L (ref 135–145)
Total Bilirubin: 0.3 mg/dL (ref 0.3–1.2)
Total Protein: 7.8 g/dL (ref 6.5–8.1)

## 2023-04-04 LAB — CBC WITH DIFFERENTIAL (CANCER CENTER ONLY)
Abs Immature Granulocytes: 0.01 10*3/uL (ref 0.00–0.07)
Basophils Absolute: 0.1 10*3/uL (ref 0.0–0.1)
Basophils Relative: 1 %
Eosinophils Absolute: 0.4 10*3/uL (ref 0.0–0.5)
Eosinophils Relative: 8 %
HCT: 28.2 % — ABNORMAL LOW (ref 36.0–46.0)
Hemoglobin: 8.7 g/dL — ABNORMAL LOW (ref 12.0–15.0)
Immature Granulocytes: 0 %
Lymphocytes Relative: 34 %
Lymphs Abs: 1.5 10*3/uL (ref 0.7–4.0)
MCH: 22.7 pg — ABNORMAL LOW (ref 26.0–34.0)
MCHC: 30.9 g/dL (ref 30.0–36.0)
MCV: 73.6 fL — ABNORMAL LOW (ref 80.0–100.0)
Monocytes Absolute: 0.5 10*3/uL (ref 0.1–1.0)
Monocytes Relative: 12 %
Neutro Abs: 2 10*3/uL (ref 1.7–7.7)
Neutrophils Relative %: 45 %
Platelet Count: 309 10*3/uL (ref 150–400)
RBC: 3.83 MIL/uL — ABNORMAL LOW (ref 3.87–5.11)
RDW: 16.6 % — ABNORMAL HIGH (ref 11.5–15.5)
WBC Count: 4.4 10*3/uL (ref 4.0–10.5)
nRBC: 0 % (ref 0.0–0.2)

## 2023-04-04 LAB — IRON AND IRON BINDING CAPACITY (CC-WL,HP ONLY)
Iron: 23 ug/dL — ABNORMAL LOW (ref 28–170)
Saturation Ratios: 5 % — ABNORMAL LOW (ref 10.4–31.8)
TIBC: 430 ug/dL (ref 250–450)
UIBC: 407 ug/dL (ref 148–442)

## 2023-04-04 LAB — SAMPLE TO BLOOD BANK

## 2023-04-04 LAB — FERRITIN: Ferritin: 8 ng/mL — ABNORMAL LOW (ref 11–307)

## 2023-04-04 NOTE — Progress Notes (Signed)
[facility] [phone]   [phone]  OFFICE PROGRESS NOTE  [name] Peng, MD [address] [place] [address]  DIAGNOSIS: Iron deficiency anemia    PRIOR THERAPY: None   CURRENT THERAPY:  1) oral iron supplement OTC tablet p.o. daily 2) IV iron as needed most recent with Ferrlecit 250 mg on 12/04/2022  INTERVAL HISTORY: [name] 77 y.o. female returns to the clinic today for follow-up visit accompanied by her sister.Discussed the use of AI scribe software for clinical note transcription with the patient, who gave verbal consent to proceed.  History of Present Illness   The patient, a 77 year old with a history of anemia, seizures, heart issues, and COPD, presents for a follow-up visit. She received an iron infusion in June, which improved her iron levels. She has been taking oral iron supplements daily without any adverse effects. However, she experienced a seizure episode recently, which was unusual as she had been seizure-free for a long time. She is on Keppra for seizure management.  The patient also has a history of heart issues, including a repaired hole in her heart and the placement of a device in her heart and lungs. She is on a heart monitor and uses a CPAP machine for COPD and sleep apnea. She reported a single episode of a nosebleed in August but denied any recent blood loss in the stool.  Despite the iron supplementation, her hemoglobin and hematocrit levels remain low, indicating persistent anemia. She reported feeling weak and experiencing occasional dizzy spells and breathing issues, which could be attributed to the anemia.       MEDICAL HISTORY: Past Medical History:  Diagnosis Date   Abnormal liver function     in the past.   Anemia    Arthritis    all over   Bruises easily    Cataract    right eye;immature   CHF (congestive heart failure) (HCC)    Chronic back pain    stenosis   Chronic cough     Chronic kidney disease    COPD (chronic obstructive pulmonary disease) (HCC)    COVID    aug/sept 2021   Demand myocardial infarction [facility]) 2012   Demand Infarction in setting of Pancreatitis --> mild Troponin elevation, NON-OBSTRUCTIVE CAD   Depression    takes Abilify daily as well as Zoloft   Diastolic heart failure 2010   Grade 1 diastolic Dysfunction by Echo    Diverticulosis    DM (diabetes mellitus) (HCC)    takes Victoza daily as well as Lantus and Humalog   Empty sella (HCC)    on MRI in 2009.   Glaucoma    Headache(784.0)    last migraine-4-30yrs ago   History of blood transfusion    no abnormal reaction noted   History of colon polyps    benign   HTN (hypertension)    takes Benicar,Imdur,and Bystolic daily   Hyperlipidemia    takes Lipitor daily   Joint swelling    Nocturia    Obesity hypoventilation syndrome (HCC)    Obstructive sleep apnea 02/2018   Notably improved split-night study with weight loss from 270 (2017) down to 250 pounds (2019).   Pancreatitis    takes Pancrelipase daily   Parkinson's disease (HCC)    takes Sinemet daily   Peripheral neuropathy    Pneumonia 2012   Seizures (HCC)    takes Lamictal daily and Primidone nightly;last seizure 2wks ago  Urinary urgency    With increased frequency   Varicose veins of both lower extremities with pain    With edema.  Takes daily Lasix    ALLERGIES:  is allergic to other, penicillins, sulfa antibiotics, aspirin, and codeine.  MEDICATIONS:  Current Outpatient Medications  Medication Sig Dispense Refill   acetaminophen (TYLENOL) 500 MG tablet Take 500-1,000 mg by mouth every 4 (four) hours as needed for moderate pain or mild pain.     albuterol (VENTOLIN HFA) 108 (90 Base) MCG/ACT inhaler Inhale 1-2 puffs into the lungs every 6 (six) hours as needed for wheezing or shortness of breath. 18 g 5   Alcaftadine (LASTACAFT) 0.25 % SOLN Place 1-2 drops into both eyes daily as needed (allergy  eye/irritation.).     amiodarone (PACERONE) 100 MG tablet Take 1 tablet (100 mg total) by mouth daily. (Patient taking differently: Take 50 mg by mouth daily.) 30 tablet 3   Ascorbic Acid (VITAMIN C) 1000 MG tablet Take 1,000 mg by mouth in the morning.     atorvastatin (LIPITOR) 40 MG tablet Take 1 tablet (40 mg total) by mouth daily. 90 tablet 3   B-D ULTRAFINE III SHORT PEN 31G X 8 MM MISC USE DAILY WITH VICTOZA AND/OR NOVOLOG. 400 each 2   benzonatate (TESSALON) 100 MG capsule Take 1 capsule by mouth three times daily as needed for cough 30 capsule 0   Blood Glucose Monitoring Suppl (TRUE METRIX METER) w/Device KIT USE AS DIRECTED 1 kit 1   carvedilol (COREG) 12.5 MG tablet TAKE 1 TABLET TWICE DAILY 180 tablet 3   diclofenac sodium (VOLTAREN) 1 % GEL Apply 2 g topically 4 (four) times daily as needed (pain).     ELIQUIS 5 MG TABS tablet Take 1 tablet by mouth twice daily 180 tablet 0   FeFum-FePoly-FA-B Cmp-C-Biot (INTEGRA PLUS) CAPS Take 1 capsule by mouth every morning. 30 capsule 2   ferrous sulfate 324 MG TBEC Take 324 mg by mouth daily with breakfast.     furosemide (LASIX) 20 MG tablet Take 4 tablets (80 mg total) by mouth 2 (two) times daily. (Patient taking differently: Take 80 mg by mouth 2 (two) times daily. Take 4 tablets (80 mg) in  morning and 3 tablets (60 mg) at night.) 240 tablet 3   Glucagon (GVOKE HYPOPEN 2-PACK) 0.5 MG/0.1ML SOAJ INJECT 0.5 MG (1 PEN) INTO THE SKIN DAILY AS NEEDED. 2 mL 11   guaiFENesin-dextromethorphan (ROBITUSSIN DM) 100-10 MG/5ML syrup Take 10 mLs by mouth every 6 (six) hours as needed for cough. 118 mL 0   hydrALAZINE (APRESOLINE) 100 MG tablet Take 1 tablet (100 mg total) by mouth 3 (three) times daily. 270 tablet 3   hydrocortisone cream 1 % Apply 1 application topically daily as needed for itching.      insulin glargine, 1 Unit Dial, (TOUJEO) 300 UNIT/ML Solostar Pen Inject 20 Units into the skin at bedtime.     insulin lispro (HUMALOG KWIKPEN) 100  UNIT/ML KwikPen Inject 0-10 Units into the skin See admin instructions. Sliding scale:Inject  (high blood sugar over 150). 150-199=2 units;200-249=4 units,250-299=6 units;300-349=8 units;350 above 10 units: Over 350 call physician 15 mL 0   Isopropyl Alcohol (ALCOHOL WIPES) 70 % MISC Apply 1 each topically 2 (two) times daily. 300 each 3   isosorbide mononitrate (IMDUR) 30 MG 24 hr tablet Take 1 tablet (30 mg total) by mouth daily. 90 tablet 3   lamoTRIgine (LAMICTAL) 150 MG tablet Take 1 tablet (150 mg total) by  mouth 2 (two) times daily. 180 tablet 3   levETIRAcetam (KEPPRA) 500 MG tablet TAKE 2 TABLETS EVERY NIGHT 180 tablet 0   loratadine (CLARITIN) 10 MG tablet Take 1 tablet (10 mg total) by mouth daily. 30 tablet 2   oxyCODONE (OXY IR/ROXICODONE) 5 MG immediate release tablet Take 5 mg by mouth every 6 (six) hours as needed (pain.).     OZEMPIC, 1 MG/DOSE, 4 MG/3ML SOPN INJECT 1MG  UNDER THE SKIN ONE TIME WEEKLY 9 mL 3   Pancrelipase, Lip-Prot-Amyl, 25000-79000 units CPEP Take 2 capsules by mouth in the morning, at noon, and at bedtime.     potassium chloride SA (KLOR-CON M) 20 MEQ tablet Take 2 tablets (40 mEq total) by mouth daily. With additional on metolazone days 300 tablet 3   sennosides-docusate sodium (SENOKOT-S) 8.6-50 MG tablet Take 1-2 tablets by mouth at bedtime as needed for constipation.     sertraline (ZOLOFT) 50 MG tablet TAKE 1 TABLET EVERY DAY 90 tablet 3   tiZANidine (ZANAFLEX) 2 MG tablet TAKE 1 TABLET AT BEDTIME AS NEEDED 30 tablet 3   tolnaftate (TINACTIN) 1 % cream Apply 1 application topically daily as needed (foot fungus).      TRUE METRIX BLOOD GLUCOSE TEST test strip USE AS DIRECTED TO CHECK BLOOD SUGARS 2 TIMES PER DAY 200 strip 10   TRUEplus Lancets 33G MISC TEST BLOOD SUGAR TWICE DAILY AS DIRECTED 200 each 3   No current facility-administered medications for this visit.   Facility-Administered Medications Ordered in Other Visits  Medication Dose Route  Frequency Provider Last Rate Last Admin   0.9 %  sodium chloride infusion (Manually program via Guardrails IV Fluids)   Intravenous Once [place], Anderson Malta, FNP       0.9 %  sodium chloride infusion (Manually program via Guardrails IV Fluids)   Intravenous Once [place], Anderson Malta, FNP        SURGICAL HISTORY:  Past Surgical History:  Procedure Laterality Date   ABDOMINAL HYSTERECTOMY     APPENDECTOMY     ATRIAL SEPTAL DEFECT(ASD) CLOSURE N/A 01/02/2023   Procedure: ATRIAL SEPTAL DEFECT(ASD) CLOSURE;  Surgeon: Tonny Bollman, MD;  Location: [facility] INVASIVE CV LAB;  Service: Cardiovascular;  Laterality: N/A;   BACK SURGERY     Cardiac Event Monitor  September-October 2017   Sinus rhythm with occasional PACs and artifact. No arrhythmias besides one short run of tachycardia.   CARDIOVERSION N/A 08/15/2022   Procedure: CARDIOVERSION;  Surgeon: Laurey Morale, MD;  Location: [facility] ENDOSCOPY;  Service: Cardiovascular;  Laterality: N/A;   CARDIOVERSION N/A 02/04/2023   Procedure: CARDIOVERSION;  Surgeon: Laurey Morale, MD;  Location: [facility] INVASIVE CV LAB;  Service: Cardiovascular;  Laterality: N/A;   CHOLECYSTECTOMY     COLONOSCOPY N/A 08/15/2012   Procedure: COLONOSCOPY;  Surgeon: Willis Modena, MD;  Location: [facility] ENDOSCOPY;  Service: Endoscopy;  Laterality: N/A;   COLONOSCOPY WITH PROPOFOL Left 06/17/2017   Procedure: COLONOSCOPY WITH PROPOFOL;  Surgeon: Kerin Salen, MD;  Location: [facility] ENDOSCOPY;  Service: Gastroenterology;  Laterality: Left;   COLONOSCOPY WITH PROPOFOL N/A 04/03/2021   Procedure: COLONOSCOPY WITH PROPOFOL;  Surgeon: Vida Rigger, MD;  Location: [facility] ENDOSCOPY;  Service: Endoscopy;  Laterality: N/A;   CORONARY CALCIUM SCORE AND CTA  04/2018   Coronary calcium score 71.9.  Very large, hyperdynamic LAD wrapping apex giving rise to PDA.  Moderate LAD-diagonal and circumflex plaque -> CT FFR suggested positive findings in distal D1, D2 and distal circumflex.  Referred for cath. ==>  FALSE  POSITIVE   ESOPHAGOGASTRODUODENOSCOPY     eye cysts Bilateral    LASIK     LEFT HEART CATH AND CORONARY ANGIOGRAPHY N/A 05/16/2018   Procedure: LEFT HEART CATH AND CORONARY ANGIOGRAPHY;  Surgeon: Lyn Records, MD;  Location: [facility] INVASIVE CV LAB:  Angiographically normal coronary arteries with LVEDP of 21 mmHg.  Large draping hyperdominant LAD that wraps the apex and provides distal half of the PDA.  Relatively small caliber distal Cx -->proxLPDA, non-dom RCA. No Cx or Diag lesions -- FALSE + CT FFR   LEFT HEART CATH AND CORONARY ANGIOGRAPHY  2009/March 2012   2009: (Dr. Janene Harvey) Nonobstructive CAD; 2012: Minimal CAD --> false positive stress test   NM MYOVIEW LTD  01/2016; 01/2018   a)LOW RISK. No ischemia or infarction;; b) EF 55-60%. NO ST changes.  No ischemia or Infarction. NO significant RV enlargement.  LOW RISK.   POLYPECTOMY  04/03/2021   Procedure: POLYPECTOMY;  Surgeon: Vida Rigger, MD;  Location: [facility] ENDOSCOPY;  Service: Endoscopy;;   Polysomnogram  02/2018   (Dr. Vickey Huger from neurology): Split study was not adequately done due to low AHI.  She slept reclined and had an AHI less than 10, prolonged hypoxemia and no hypercapnia. -->  She has home oxygen already prescribed, but did not meet criteria for nightly oxygen.  (Was noted that the patient did not cooperate well with study.  Titration was discussed.   PRESSURE SENSOR/CARDIOMEMS N/A 11/09/2019   Procedure: PRESSURE SENSOR/CARDIOMEMS;  Surgeon: Laurey Morale, MD;  Location: [facility] INVASIVE CV LAB;  Service: Cardiovascular;  Laterality: N/A;   RECTAL POLYPECTOMY     RIGHT HEART CATH N/A 09/07/2022   Procedure: RIGHT HEART CATH;  Surgeon: Laurey Morale, MD;  Location: [facility] INVASIVE CV LAB;  Service: Cardiovascular;  Laterality: N/A;   RIGHT/LEFT HEART CATH AND CORONARY ANGIOGRAPHY N/A 04/06/2019   Procedure: RIGHT/LEFT HEART CATH AND CORONARY ANGIOGRAPHY;  Surgeon: Marykay Lex, MD;  Location: [facility] INVASIVE CV LAB; angiographically  normal coronary arteries.  Moderately elevated AD LVEDP..  Mild pulmonary hypertension: PA P 56/14 mmHg-mean 31 mmHg.  RAP 10 mmHg.  RV P-EDP 54/4 mmHg - 12 mmHg.  PCWP 11-15 mmHg.  CO-CI: 7.35-3.73.   TEE WITHOUT CARDIOVERSION N/A 08/15/2022   Procedure: TRANSESOPHAGEAL ECHOCARDIOGRAM (TEE);  Surgeon: Laurey Morale, MD;  Location: [facility] ENDOSCOPY;  Service: Cardiovascular;  Laterality: N/A;   TONSILLECTOMY     TRANSTHORACIC ECHOCARDIOGRAM  01/2016; 05/2017   A) Normal LV chamber size with mod LVH pattern. EF 50-55%. Severe LA dilation. Mod RA dilation. PAP elevated at 38 mmHg;; B) Mild concentric LVH.  EF 55-60% & no RWMA.  Grade 2 DD.  Diastolic flattening of the ventricular septum == ? elevated PAP.  Mild aortic valve calcification/sclerosis.  Mod LA dilation.  Mild RV dilation & Mod RA dilation   TRANSTHORACIC ECHOCARDIOGRAM  10/2017; 01/2018   a) EF 55-60%.  No RWMA,  Moderate LA dilation.  Mild LA dilation.  Peak PA pressures ~57 mmHg (moderate);;; b) EF 55-60%.  GRII DD.  Suggestion of RV volume overload with moderate RV dilation.  Severe LA and RA dilation.Marland Kitchen  PA pressure ~67%.   TRANSTHORACIC ECHOCARDIOGRAM  01/2019   Normal LV size and function.  EF 50 to 55%.  Impaired relaxation.  RV appears to have moderately reduced function.  Severely enlarged.  Cannot fully assess RV pressures.  Hypermobile interatrial septum.  Right atrium severely dilated.   TUBAL LIGATION     vaginal  cyst removed     several times    REVIEW OF SYSTEMS:  A comprehensive review of systems was negative except for: Constitutional: positive for fatigue Respiratory: positive for dyspnea on exertion   PHYSICAL EXAMINATION: General appearance: alert, cooperative, fatigued, and no distress Head: Normocephalic, without obvious abnormality, atraumatic Neck: no adenopathy, no JVD, supple, symmetrical, trachea midline, and thyroid not enlarged, symmetric, no tenderness/mass/nodules Lymph nodes: Cervical, supraclavicular, and  axillary nodes normal. Resp: clear to auscultation bilaterally Back: symmetric, no curvature. ROM normal. No CVA tenderness. Cardio: regular rate and rhythm, S1, S2 normal, no murmur, click, rub or gallop GI: soft, non-tender; bowel sounds normal; no masses,  no organomegaly Extremities: extremities normal, atraumatic, no cyanosis or edema  ECOG PERFORMANCE STATUS: 1 - Symptomatic but completely ambulatory  Blood pressure (!) 147/64, pulse 62, temperature 98 F (36.7 C), temperature source Oral, resp. rate 16, height 5\' 1"  (1.549 m), weight 182 lb 4.8 oz (82.7 kg), SpO2 100%.  LABORATORY DATA: Lab Results  Component Value Date   WBC 4.4 04/04/2023   HGB 8.7 (L) 04/04/2023   HCT 28.2 (L) 04/04/2023   MCV 73.6 (L) 04/04/2023   PLT 309 04/04/2023      Chemistry      Component Value Date/Time   NA 138 04/02/2023 2140   NA 140 12/26/2022 1107   K 4.0 04/02/2023 2140   CL 102 04/02/2023 2140   CO2 27 04/02/2023 2140   BUN 14 04/02/2023 2140   BUN 13 12/26/2022 1107   CREATININE 1.11 (H) 04/02/2023 2140   CREATININE 0.95 10/22/2022 1323   CREATININE 0.91 10/04/2016 1144      Component Value Date/Time   CALCIUM 8.7 (L) 04/02/2023 2140   ALKPHOS 103 04/02/2023 2140   AST 34 04/02/2023 2140   AST 19 10/22/2022 1323   ALT 20 04/02/2023 2140   ALT 16 10/22/2022 1323   BILITOT 0.7 04/02/2023 2140   BILITOT 0.3 10/22/2022 1323       RADIOGRAPHIC STUDIES: CT HEAD WO CONTRAST  Result Date: 04/02/2023 CLINICAL DATA:  Seizure disorder, clinical change EXAM: CT HEAD WITHOUT CONTRAST TECHNIQUE: Contiguous axial images were obtained from the base of the skull through the vertex without intravenous contrast. RADIATION DOSE REDUCTION: This exam was performed according to the departmental dose-optimization program which includes automated exposure control, adjustment of the mA and/or kV according to patient size and/or use of iterative reconstruction technique. COMPARISON:  Head CT  07/05/2022, brain MRI 07/06/2022 FINDINGS: Brain: No intracranial hemorrhage, mass effect, or midline shift. Stable brain volume. No hydrocephalus. The basilar cisterns are patent. Again seen small remote infarct posterior right frontal lobe. Similar mild chronic small vessel ischemic change. No evidence of territorial infarct or acute ischemia. Empty sella, chronic. No extra-axial or intracranial fluid collection. Vascular: Atherosclerosis of skullbase vasculature without hyperdense vessel or abnormal calcification. Skull: No acute findings. Sinuses/Orbits: Paranasal sinuses and mastoid air cells are clear. The visualized orbits are unremarkable. Bilateral cataract resection Other: None. IMPRESSION: 1. No acute intracranial abnormality. 2. Unchanged chronic small vessel ischemic change and small remote right frontal infarct. Electronically Signed   By: Narda Rutherford M.D.   On: 04/02/2023 22:18    ASSESSMENT AND PLAN: This is a very pleasant 77 years old African-American female with iron deficiency anemia secondary to gastrointestinal blood loss.  She is status post iron infusion with Venofer in June 2024 and she is currently on oral iron tablet on daily basis.    Anemia Persistent anemia with hemoglobin  8.7 and hematocrit 28.2. Patient is on daily oral iron supplementation. No recent significant blood loss reported. Patient reports weakness, occasional dizziness, and breathing issues. Iron studies and ferritin pending. -If iron or ferritin levels are low, arrange for iron infusion with Venofer. -Return visit in 2 months to reassess anemia.  Seizure Disorder Patient reports a recent seizure but has been seizure-free for a long period prior to this. On Keppra and Lamictal. -Continue current antiepileptic medications.  Cardiac History Patient has a history of heart issues including a repaired hole in the heart and a device in the heart and lungs. Patient is on a heart monitor and uses a CPAP machine  for COPD and sleep apnea. -Continue current cardiac and respiratory management.  Follow-up in 2 months or sooner if iron infusion is needed.   The patient was advised to call immediately if she has any other concerning symptoms in the interval.  The patient voices understanding of current disease status and treatment options and is in agreement with the current care plan.  All questions were answered. The patient knows to call the clinic with any problems, questions or concerns. We can certainly see the patient much sooner if necessary.  The total time spent in the appointment was 20 minutes.  Disclaimer: This note was dictated with voice recognition software. Similar sounding words can inadvertently be transcribed and may not be corrected upon review.

## 2023-04-05 ENCOUNTER — Encounter: Payer: Self-pay | Admitting: Internal Medicine

## 2023-04-05 ENCOUNTER — Telehealth: Payer: Self-pay | Admitting: Pharmacy Technician

## 2023-04-05 ENCOUNTER — Telehealth: Payer: Self-pay

## 2023-04-05 ENCOUNTER — Other Ambulatory Visit: Payer: Self-pay

## 2023-04-05 DIAGNOSIS — N182 Chronic kidney disease, stage 2 (mild): Secondary | ICD-10-CM

## 2023-04-05 DIAGNOSIS — D500 Iron deficiency anemia secondary to blood loss (chronic): Secondary | ICD-10-CM | POA: Insufficient documentation

## 2023-04-05 LAB — LAMOTRIGINE LEVEL: Lamotrigine Lvl: 7.7 ug/mL (ref 2.0–20.0)

## 2023-04-05 LAB — LEVETIRACETAM LEVEL: Levetiracetam Lvl: 36.7 ug/mL (ref 10.0–40.0)

## 2023-04-05 MED ORDER — INSULIN LISPRO (1 UNIT DIAL) 100 UNIT/ML (KWIKPEN): 0.0000 [IU] | PEN_INJECTOR | SUBCUTANEOUS | 0 refills | Status: DC

## 2023-04-05 NOTE — Transitions of Care (Post Inpatient/ED Visit) (Signed)
04/05/2023  Name: [name] MRN: [id number] DOB: 1946-03-30  Today's TOC FU Call Status:   Patient's Name and Date of Birth confirmed.  Transition Care Management Follow-up Telephone Call Date of Discharge: 04/02/23 Discharge Facility: [facility]) Type of Discharge: Emergency Department Reason for ED Visit: Other: How have you been since you were released from the hospital?: Better Any questions or concerns?: No  Items Reviewed: Did you receive and understand the discharge instructions provided?: Yes Medications obtained,verified, and reconciled?: Yes (Medications Reviewed) Any new allergies since your discharge?: No Dietary orders reviewed?: NA Do you have support at home?: Yes People in Home: sibling(s) Name of Support/Comfort Primary Source: Gwynne Edinger  Medications Reviewed Today: Medications Reviewed Today   Medications were not reviewed in this encounter     Home Care and Equipment/Supplies: Were Home Health Services Ordered?: No Any new equipment or medical supplies ordered?: No  Functional Questionnaire: Do you need assistance with bathing/showering or dressing?: No Do you need assistance with meal preparation?: No Do you need assistance with eating?: No Do you have difficulty maintaining continence: No Do you need assistance with getting out of bed/getting out of a chair/moving?: No Do you have difficulty managing or taking your medications?: No  Follow up appointments reviewed: PCP Follow-up appointment confirmed?: Yes Date of PCP follow-up appointment?:  (patient offered appointment. patient declined.) Specialist Hospital Follow-up appointment confirmed?: NA Do you need transportation to your follow-up appointment?: No Do you understand care options if your condition(s) worsen?: Yes-patient verbalized understanding    SIGNATURE Randa Lynn, CMA

## 2023-04-05 NOTE — Telephone Encounter (Signed)
Auth Submission: NO AUTH NEEDED Site of care: Site of care: [facility] Payer: human medicare Medication & CPT/J Code(s) submitted: Venofer (Iron Sucrose) J1756 Route of submission (phone, fax, portal):  Phone # Fax # Auth type: Buy/Bill PB Units/visits requested: 3 Reference number:  Approval from: 04/05/23 to 06/25/23

## 2023-04-16 ENCOUNTER — Ambulatory Visit: Payer: Medicare HMO

## 2023-04-17 ENCOUNTER — Ambulatory Visit: Payer: Medicare HMO | Admitting: Neurology

## 2023-04-22 ENCOUNTER — Ambulatory Visit (INDEPENDENT_AMBULATORY_CARE_PROVIDER_SITE_OTHER): Payer: Medicare HMO

## 2023-04-22 ENCOUNTER — Telehealth: Payer: Self-pay | Admitting: Pulmonary Disease

## 2023-04-22 VITALS — BP 158/76 | HR 55 | Temp 98.4°F | Resp 16 | Ht 61.0 in | Wt 182.6 lb

## 2023-04-22 DIAGNOSIS — D500 Iron deficiency anemia secondary to blood loss (chronic): Secondary | ICD-10-CM | POA: Diagnosis not present

## 2023-04-22 DIAGNOSIS — K922 Gastrointestinal hemorrhage, unspecified: Secondary | ICD-10-CM

## 2023-04-22 MED ORDER — DIPHENHYDRAMINE HCL 25 MG PO CAPS
25.0000 mg | ORAL_CAPSULE | Freq: Once | ORAL | Status: AC
  Administered 2023-04-22: 25 mg via ORAL
  Filled 2023-04-22: qty 1

## 2023-04-22 MED ORDER — IRON SUCROSE 300 MG IVPB - SIMPLE MED
300.0000 mg | Freq: Once | Status: AC
  Administered 2023-04-22: 300 mg via INTRAVENOUS
  Filled 2023-04-22: qty 265

## 2023-04-22 MED ORDER — ACETAMINOPHEN 325 MG PO TABS
650.0000 mg | ORAL_TABLET | Freq: Once | ORAL | Status: AC
  Administered 2023-04-22: 650 mg via ORAL
  Filled 2023-04-22: qty 2

## 2023-04-22 NOTE — Progress Notes (Signed)
Not in the past month but patient has had thoughts of going out of the back yard and drowning herself in her sisters pool back in June or July, but patient assures she is fine today and this week and doesn't have any actual intention on killing herself.

## 2023-04-22 NOTE — Telephone Encounter (Signed)
PT present to the front saying Dr. Isaiah Serge was going to order a hose for her CPAP.   She needs the hose that attached to the nasal piece. Not the hose that goes on the across the face peice.

## 2023-04-22 NOTE — Progress Notes (Signed)
Pt was advised by nurse to follow up with her doctor regarding the questions to the depression questionnaire to see if her medications need to be increased. Pt verbalized understanding and did not have any other questions or concerns.  Diagnosis: Iron Deficiency Anemia  Provider:  Chilton Greathouse MD  Procedure: IV Infusion  IV Type: Peripheral, IV Location: R Antecubital  Venofer (Iron Sucrose), Dose: 300 mg  Infusion Start Time: 1144  Infusion Stop Time: 1318  Post Infusion IV Care: Observation period completed and Peripheral IV Discontinued  Discharge: Condition: Good, Destination: Home . AVS Declined  Performed by:  Rico Ala, LPN

## 2023-04-24 ENCOUNTER — Other Ambulatory Visit: Payer: Self-pay | Admitting: Cardiology

## 2023-04-24 NOTE — Telephone Encounter (Signed)
Please order CPAP hose and supplies through DME

## 2023-04-24 NOTE — Telephone Encounter (Signed)
This is a CHF pt 

## 2023-04-26 NOTE — Telephone Encounter (Signed)
Referral for the CPAP supplies was already placed on 04/03/23  Type Date User Summary Attachment  General 04/03/2023 10:48 AM Iris Pert Msg sent to [facility] -  Note: Msg sent to [facility]  . Type Date User Summary Attachment  Provider Comments 04/03/2023 10:15 AM Glynda Jaeger, CMA Provider Comments -  Note: Order to [facility] (already established) to renew CPAP supplies for the next 1 year: mask of choice, headgear, cushions, filters, climate control tubing and water chamber.  Length of Need: Lifetime.    Pt needs new mask and tubing

## 2023-04-29 ENCOUNTER — Other Ambulatory Visit: Payer: Self-pay | Admitting: Neurology

## 2023-04-29 ENCOUNTER — Ambulatory Visit: Payer: Medicare HMO

## 2023-04-29 VITALS — BP 132/62 | HR 63 | Temp 98.2°F | Resp 18 | Ht 61.0 in | Wt 185.6 lb

## 2023-04-29 DIAGNOSIS — D500 Iron deficiency anemia secondary to blood loss (chronic): Secondary | ICD-10-CM | POA: Diagnosis not present

## 2023-04-29 DIAGNOSIS — K922 Gastrointestinal hemorrhage, unspecified: Secondary | ICD-10-CM | POA: Diagnosis not present

## 2023-04-29 MED ORDER — IRON SUCROSE 300 MG IVPB - SIMPLE MED
300.0000 mg | Freq: Once | Status: AC
  Administered 2023-04-29: 300 mg via INTRAVENOUS

## 2023-04-29 MED ORDER — ACETAMINOPHEN 325 MG PO TABS
650.0000 mg | ORAL_TABLET | Freq: Once | ORAL | Status: AC
  Administered 2023-04-29: 650 mg via ORAL
  Filled 2023-04-29: qty 2

## 2023-04-29 MED ORDER — DIPHENHYDRAMINE HCL 25 MG PO CAPS
25.0000 mg | ORAL_CAPSULE | Freq: Once | ORAL | Status: AC
  Administered 2023-04-29: 25 mg via ORAL
  Filled 2023-04-29: qty 1

## 2023-04-29 NOTE — Progress Notes (Signed)
Diagnosis: Iron Deficiency Anemia  Provider:  Chilton Greathouse MD  Procedure: IV Infusion  IV Type: Peripheral, IV Location: R Antecubital  Venofer (Iron Sucrose), Dose: 300 mg  Infusion Start Time: 1144  Infusion Stop Time: 1332  Post Infusion IV Care: Observation period completed and Peripheral IV Discontinued  Discharge: Condition: Good, Destination: Home . AVS Provided  Performed by:  Wyvonne Lenz, RN

## 2023-04-30 ENCOUNTER — Ambulatory Visit: Payer: Medicare HMO

## 2023-05-01 ENCOUNTER — Telehealth: Payer: Self-pay | Admitting: Cardiovascular Disease

## 2023-05-01 NOTE — Telephone Encounter (Signed)
Left message for patient informing her Georgie Chard, NP did not attempt to contact her. Also stated in message no documented attempts to contact patient from our office since August seen in chart.  Provided office number for callback if any questions.

## 2023-05-01 NOTE — Telephone Encounter (Signed)
Patient states she is returning a call. She says it may have been from Georgie Chard, NP.

## 2023-05-06 ENCOUNTER — Ambulatory Visit: Payer: Medicare HMO

## 2023-05-06 VITALS — BP 136/64 | HR 58 | Temp 98.0°F | Resp 16 | Ht 61.0 in | Wt 183.2 lb

## 2023-05-06 DIAGNOSIS — D500 Iron deficiency anemia secondary to blood loss (chronic): Secondary | ICD-10-CM | POA: Diagnosis not present

## 2023-05-06 DIAGNOSIS — K922 Gastrointestinal hemorrhage, unspecified: Secondary | ICD-10-CM | POA: Diagnosis not present

## 2023-05-06 MED ORDER — IRON SUCROSE 300 MG IVPB - SIMPLE MED
300.0000 mg | Freq: Once | Status: AC
  Administered 2023-05-06: 300 mg via INTRAVENOUS
  Filled 2023-05-06: qty 265

## 2023-05-06 MED ORDER — DIPHENHYDRAMINE HCL 25 MG PO CAPS
25.0000 mg | ORAL_CAPSULE | Freq: Once | ORAL | Status: AC
  Administered 2023-05-06: 25 mg via ORAL
  Filled 2023-05-06: qty 1

## 2023-05-06 MED ORDER — ACETAMINOPHEN 325 MG PO TABS
650.0000 mg | ORAL_TABLET | Freq: Once | ORAL | Status: AC
  Administered 2023-05-06: 650 mg via ORAL
  Filled 2023-05-06: qty 2

## 2023-05-06 NOTE — Progress Notes (Signed)
Diagnosis: Iron Deficiency Anemia  Provider:  Chilton Greathouse MD  Procedure: IV Infusion  IV Type: Peripheral, IV Location: R Antecubital  Venofer (Iron Sucrose), Dose: 300 mg  Infusion Start Time: 1128  Infusion Stop Time: 1313  Post Infusion IV Care: Observation period completed and Peripheral IV Discontinued  Discharge: Condition: Good, Destination: Home . AVS Provided  Performed by:  Rico Ala, LPN

## 2023-05-08 ENCOUNTER — Ambulatory Visit (INDEPENDENT_AMBULATORY_CARE_PROVIDER_SITE_OTHER): Payer: Medicare HMO | Admitting: Internal Medicine

## 2023-05-08 ENCOUNTER — Encounter: Payer: Self-pay | Admitting: Internal Medicine

## 2023-05-08 VITALS — BP 130/70 | HR 64 | Temp 98.1°F | Ht 61.0 in | Wt 182.4 lb

## 2023-05-08 DIAGNOSIS — Z23 Encounter for immunization: Secondary | ICD-10-CM | POA: Diagnosis not present

## 2023-05-08 DIAGNOSIS — M069 Rheumatoid arthritis, unspecified: Secondary | ICD-10-CM

## 2023-05-08 DIAGNOSIS — I130 Hypertensive heart and chronic kidney disease with heart failure and stage 1 through stage 4 chronic kidney disease, or unspecified chronic kidney disease: Secondary | ICD-10-CM

## 2023-05-08 DIAGNOSIS — R7989 Other specified abnormal findings of blood chemistry: Secondary | ICD-10-CM | POA: Diagnosis not present

## 2023-05-08 DIAGNOSIS — E66811 Obesity, class 1: Secondary | ICD-10-CM

## 2023-05-08 DIAGNOSIS — F339 Major depressive disorder, recurrent, unspecified: Secondary | ICD-10-CM

## 2023-05-08 DIAGNOSIS — D6869 Other thrombophilia: Secondary | ICD-10-CM

## 2023-05-08 DIAGNOSIS — Z794 Long term (current) use of insulin: Secondary | ICD-10-CM | POA: Diagnosis not present

## 2023-05-08 DIAGNOSIS — I5032 Chronic diastolic (congestive) heart failure: Secondary | ICD-10-CM

## 2023-05-08 DIAGNOSIS — N182 Chronic kidney disease, stage 2 (mild): Secondary | ICD-10-CM | POA: Diagnosis not present

## 2023-05-08 DIAGNOSIS — J449 Chronic obstructive pulmonary disease, unspecified: Secondary | ICD-10-CM

## 2023-05-08 DIAGNOSIS — Z6834 Body mass index (BMI) 34.0-34.9, adult: Secondary | ICD-10-CM

## 2023-05-08 DIAGNOSIS — E1122 Type 2 diabetes mellitus with diabetic chronic kidney disease: Secondary | ICD-10-CM

## 2023-05-08 DIAGNOSIS — E6609 Other obesity due to excess calories: Secondary | ICD-10-CM

## 2023-05-08 NOTE — Progress Notes (Signed)
I,[name], CMA,acting as a Neurosurgeon for [name], MD.,have documented all relevant documentation on the behalf of [name], MD,as directed by  [name], MD while in the presence of [name], MD.  Subjective:  Patient ID: [name] , female    DOB: 1946-01-28 , 77 y.o.   MRN: [id number]  Chief Complaint  Patient presents with   Diabetes   Hypertension    HPI  She is here today for a diabetes and BP check. She is accompanied by her sister [name] today. She reports compliance with meds. Denies headache, chest pain & sob. She states her BS are fluctuating. She states her sugars have gone up since her [place] the [place].    She also wants to know If it is okay to take a brain health supplement.    Hypertension This is a chronic problem. The current episode started more than 1 year ago. The problem has been gradually improving since onset. The problem is controlled. Pertinent negatives include no blurred vision, chest pain or palpitations. The current treatment provides moderate improvement. Hypertensive end-organ damage includes kidney disease and heart failure. Identifiable causes of hypertension include sleep apnea.  Diabetes She presents for her follow-up diabetic visit. She has type 2 diabetes mellitus. Her disease course has been improving. There are no hypoglycemic associated symptoms. Pertinent negatives for diabetes include no blurred vision, no chest pain, no polydipsia, no polyphagia and no polyuria. There are no hypoglycemic complications. Diabetic complications include nephropathy. Risk factors for coronary artery disease include diabetes mellitus, dyslipidemia, hypertension, obesity, post-menopausal and sedentary lifestyle. She is compliant with treatment some of the time. Her weight is fluctuating minimally. She is following a generally healthy diet. She never participates in exercise. Her home blood glucose trend is decreasing  steadily. Her breakfast blood glucose is taken between 8-9 am. Her breakfast blood glucose range is generally 130-140 mg/dl. An ACE inhibitor/angiotensin II receptor blocker is being taken. Eye exam is not current.     Past Medical History:  Diagnosis Date   Abnormal liver function     in the past.   Anemia    Arthritis    all over   Bruises easily    Cataract    right eye;immature   CHF (congestive heart failure) (HCC)    Chronic back pain    stenosis   Chronic cough    Chronic kidney disease    COPD (chronic obstructive pulmonary disease) (HCC)    COVID    aug/sept 2021   Demand myocardial infarction [facility]) 2012   Demand Infarction in setting of Pancreatitis --> mild Troponin elevation, NON-OBSTRUCTIVE CAD   Depression    takes Abilify daily as well as Zoloft   Diastolic heart failure 2010   Grade 1 diastolic Dysfunction by Echo    Diverticulosis    DM (diabetes mellitus) (HCC)    takes Victoza daily as well as Lantus and Humalog   Empty sella (HCC)    on MRI in 2009.   Glaucoma    Headache(784.0)    last migraine-4-42yrs ago   History of blood transfusion    no abnormal reaction noted   History of colon polyps    benign   HTN (hypertension)    takes Benicar,Imdur,and Bystolic daily   Hyperlipidemia    takes Lipitor daily   Joint swelling    Nocturia    Obesity hypoventilation syndrome (HCC)    Obstructive sleep apnea 02/2018  Notably improved split-night study with weight loss from 270 (2017) down to 250 pounds (2019).   Pancreatitis    takes Pancrelipase daily   Parkinson's disease (HCC)    takes Sinemet daily   Peripheral neuropathy    Pneumonia 2012   Seizures (HCC)    takes Lamictal daily and Primidone nightly;last seizure 2wks ago   Urinary urgency    With increased frequency   Varicose veins of both lower extremities with pain    With edema.  Takes daily Lasix     Family History  Problem Relation Age of Onset   Allergies Father    Heart disease  Father        before 17   Heart failure Father    Hypertension Father    Hyperlipidemia Father    Heart disease Mother    Diabetes Mother    Hypertension Mother    Hyperlipidemia Mother    Hypertension Sister    Heart disease Sister        before 4   Hyperlipidemia Sister    Diabetes Brother    Hypertension Brother    Diabetes Sister    Hypertension Sister    Diabetes Son    Hypertension Son    Cancer Other      Current Outpatient Medications:    acetaminophen (TYLENOL) 500 MG tablet, Take 500-1,000 mg by mouth every 4 (four) hours as needed for moderate pain or mild pain., Disp: , Rfl:    albuterol (VENTOLIN HFA) 108 (90 Base) MCG/ACT inhaler, Inhale 1-2 puffs into the lungs every 6 (six) hours as needed for wheezing or shortness of breath., Disp: 18 g, Rfl: 5   Alcaftadine (LASTACAFT) 0.25 % SOLN, Place 1-2 drops into both eyes daily as needed (allergy eye/irritation.)., Disp: , Rfl:    amiodarone (PACERONE) 100 MG tablet, Take 1 tablet (100 mg total) by mouth daily., Disp: 30 tablet, Rfl: 3   Ascorbic Acid (VITAMIN C) 1000 MG tablet, Take 1,000 mg by mouth in the morning., Disp: , Rfl:    atorvastatin (LIPITOR) 40 MG tablet, Take 1 tablet (40 mg total) by mouth daily., Disp: 90 tablet, Rfl: 3   B-D ULTRAFINE III SHORT PEN 31G X 8 MM MISC, USE DAILY WITH VICTOZA AND/OR NOVOLOG., Disp: 400 each, Rfl: 2   benzonatate (TESSALON) 100 MG capsule, Take 1 capsule by mouth three times daily as needed for cough, Disp: 30 capsule, Rfl: 0   Blood Glucose Monitoring Suppl (TRUE METRIX METER) w/Device KIT, USE AS DIRECTED, Disp: 1 kit, Rfl: 1   carvedilol (COREG) 12.5 MG tablet, TAKE 1 TABLET TWICE DAILY, Disp: 180 tablet, Rfl: 3   diclofenac sodium (VOLTAREN) 1 % GEL, Apply 2 g topically 4 (four) times daily as needed (pain)., Disp: , Rfl:    ELIQUIS 5 MG TABS tablet, Take 1 tablet by mouth twice daily, Disp: 180 tablet, Rfl: 0   FeFum-FePoly-FA-B Cmp-C-Biot (INTEGRA PLUS) CAPS, Take 1  capsule by mouth every morning., Disp: 30 capsule, Rfl: 2   ferrous sulfate 324 MG TBEC, Take 324 mg by mouth daily with breakfast., Disp: , Rfl:    furosemide (LASIX) 20 MG tablet, TAKE 4 TABLETS TWICE DAILY, Disp: 720 tablet, Rfl: 3   Glucagon (GVOKE HYPOPEN 2-PACK) 0.5 MG/0.1ML SOAJ, INJECT 0.5 MG (1 PEN) INTO THE SKIN DAILY AS NEEDED., Disp: 2 mL, Rfl: 11   guaiFENesin-dextromethorphan (ROBITUSSIN DM) 100-10 MG/5ML syrup, Take 10 mLs by mouth every 6 (six) hours as needed for cough., Disp: 118  mL, Rfl: 0   hydrALAZINE (APRESOLINE) 100 MG tablet, Take 1 tablet (100 mg total) by mouth 3 (three) times daily., Disp: 270 tablet, Rfl: 3   hydrocortisone cream 1 %, Apply 1 application topically daily as needed for itching. , Disp: , Rfl:    insulin glargine, 1 Unit Dial, (TOUJEO) 300 UNIT/ML Solostar Pen, Inject 20 Units into the skin at bedtime., Disp: , Rfl:    insulin lispro (HUMALOG KWIKPEN) 100 UNIT/ML KwikPen, Inject 0-10 Units into the skin See admin instructions. Sliding scale:Inject  (high blood sugar over 150). 150-199=2 units;200-249=4 units,250-299=6 units;300-349=8 units;350 above 10 units: Over 350 call physician, Disp: 15 mL, Rfl: 0   Isopropyl Alcohol (ALCOHOL WIPES) 70 % MISC, Apply 1 each topically 2 (two) times daily., Disp: 300 each, Rfl: 3   isosorbide mononitrate (IMDUR) 30 MG 24 hr tablet, Take 1 tablet (30 mg total) by mouth daily., Disp: 90 tablet, Rfl: 3   lamoTRIgine (LAMICTAL) 200 MG tablet, Take 1 tablet (200 mg total) by mouth 2 (two) times daily., Disp: 180 tablet, Rfl: 3   levETIRAcetam (KEPPRA) 500 MG tablet, Take 2 tablets every night, Disp: 180 tablet, Rfl: 3   loratadine (CLARITIN) 10 MG tablet, Take 1 tablet (10 mg total) by mouth daily., Disp: 30 tablet, Rfl: 2   oxyCODONE (OXY IR/ROXICODONE) 5 MG immediate release tablet, Take 5 mg by mouth every 6 (six) hours as needed (pain.)., Disp: , Rfl:    OZEMPIC, 1 MG/DOSE, 4 MG/3ML SOPN, INJECT 1MG  UNDER THE SKIN ONE TIME  WEEKLY, Disp: 9 mL, Rfl: 3   Pancrelipase, Lip-Prot-Amyl, 25000-79000 units CPEP, Take 2 capsules by mouth in the morning, at noon, and at bedtime., Disp: , Rfl:    potassium chloride SA (KLOR-CON M) 20 MEQ tablet, Take 2 tablets (40 mEq total) by mouth daily. With additional on metolazone days, Disp: 300 tablet, Rfl: 3   sennosides-docusate sodium (SENOKOT-S) 8.6-50 MG tablet, Take 1-2 tablets by mouth at bedtime as needed for constipation., Disp: , Rfl:    sertraline (ZOLOFT) 50 MG tablet, TAKE 1 TABLET EVERY DAY, Disp: 90 tablet, Rfl: 3   tiZANidine (ZANAFLEX) 2 MG tablet, TAKE 1 TABLET AT BEDTIME AS NEEDED, Disp: 30 tablet, Rfl: 3   tolnaftate (TINACTIN) 1 % cream, Apply 1 application topically daily as needed (foot fungus). , Disp: , Rfl:    TRUE METRIX BLOOD GLUCOSE TEST test strip, USE AS DIRECTED TO CHECK BLOOD SUGARS 2 TIMES PER DAY, Disp: 200 strip, Rfl: 10   TRUEplus Lancets 33G MISC, TEST BLOOD SUGAR TWICE DAILY AS DIRECTED, Disp: 200 each, Rfl: 3 No current facility-administered medications for this visit.  Facility-Administered Medications Ordered in Other Visits:    0.9 %  sodium chloride infusion (Manually program via Guardrails IV Fluids), , Intravenous, Once, [place], Jessica M, FNP   0.9 %  sodium chloride infusion (Manually program via Guardrails IV Fluids), , Intravenous, Once, [place], Jessica M, FNP   Allergies  Allergen Reactions   Other Anaphylaxis and Rash    Bleach Cigarette smoke/ Cough   Penicillins Hives    Did it involve swelling of the face/tongue/throat, SOB, or low BP? No Did it involve sudden or severe rash/hives, skin peeling, or any reaction on the inside of your mouth or nose? Yes Did you need to seek medical attention at a hospital or doctor's office? Yes When did it last happen?      Childhood allergy  If all above answers are "NO", may proceed with cephalosporin  use.     Sulfa Antibiotics Hives   Aspirin Other (See Comments)     On aspirin  81 mg - Rectal bleeding in dec 2018   Codeine     Headache and makes the patient feel "off"     Review of Systems  Constitutional: Negative.   Eyes:  Negative for blurred vision.  Respiratory: Negative.    Cardiovascular: Negative.  Negative for chest pain and palpitations.  Endocrine: Negative for polydipsia, polyphagia and polyuria.  Neurological: Negative.   Psychiatric/Behavioral: Negative.       Today's Vitals   05/08/23 1140  BP: 130/70  Pulse: 64  Temp: 98.1 F (36.7 C)  SpO2: 98%  Weight: 182 lb 6.4 oz (82.7 kg)  Height: 5\' 1"  (1.549 m)   Body mass index is 34.46 kg/m.  Wt Readings from Last 3 Encounters:  05/16/23 177 lb 6.4 oz (80.5 kg)  05/08/23 182 lb 6.4 oz (82.7 kg)  05/06/23 183 lb 3.2 oz (83.1 kg)     Objective:  Physical Exam Vitals and nursing note reviewed.  Constitutional:      Appearance: Normal appearance.  HENT:     Head: Normocephalic and atraumatic.  Eyes:     Extraocular Movements: Extraocular movements intact.  Cardiovascular:     Rate and Rhythm: Normal rate. Rhythm irregular.  Pulmonary:     Effort: Pulmonary effort is normal.     Breath sounds: Normal breath sounds.  Skin:    General: Skin is warm.  Neurological:     General: No focal deficit present.     Mental Status: She is alert.  Psychiatric:        Mood and Affect: Mood normal.        Behavior: Behavior normal.         Assessment And Plan:  Type 2 diabetes mellitus with stage 2 chronic kidney disease, with long-term current use of insulin (HCC) Assessment & Plan: Chronic, currently on weekly semaglutide 1mg  weekly. Encouraged to gradually increase her daily activity. She will f/u in four months.   Orders: -     Hemoglobin A1c  Hypertensive heart and renal disease with renal failure, stage 1 through stage 4 or unspecified chronic kidney disease, with heart failure (HCC) Assessment & Plan: Chronic, controlled. She will continue with current meds including  carvedilol, isosorbide mononitrate 30mg  daily, furosemide and hydralazine. Importance of dietary compliance was discussed with the patient.    Acquired thrombophilia (HCC) Assessment & Plan: Chronic, currently on Eliquis due to underlying PAF.    Chronic diastolic CHF (congestive heart failure), NYHA class 2 (HCC) Assessment & Plan: Chronic, appears stable. Importance of dietary/medication compliance was discussed with the patient. She is encouraged to gradually increase her daily activity.    Recurrent depression (HCC) Assessment & Plan: Chronic, sx currently stable on sertraline and lamotrigine.    Abnormal thyroid blood test Assessment & Plan: I will check labs as below. I will treat as indicated.   Orders: -     TSH + free T4  Rheumatoid arthritis involving both feet, unspecified whether rheumatoid factor present (HCC) Assessment & Plan: Chronic, as per Podiatry. Encouraged to follow anti-inflammatory diet. She was referred to Rhe[facility] by Podiatry in 2021, will request these records.    Class 1 obesity due to excess calories with serious comorbidity and body mass index (BMI) of 34.0 to 34.9 in adult Assessment & Plan: She is encouraged to strive for BMI less than 30 to decrease cardiac risk. Advised to  aim for at least 150 minutes of exercise per week.    Immunization due -     Pfizer Comirnaty Covid-19 Vaccine 49yrs & older  She is encouraged to strive for BMI less than 30 to decrease cardiac risk. Advised to aim for at least 150 minutes of exercise per week.    Return if symptoms worsen or fail to improve.  Patient was given opportunity to ask questions. Patient verbalized understanding of the plan and was able to repeat key elements of the plan. All questions were answered to their satisfaction.    I, [name], MD, have reviewed all documentation for this visit. The documentation on 05/08/23 for the exam, diagnosis, procedures, and orders are all accurate and  complete.   IF YOU HAVE BEEN REFERRED TO A SPECIALIST, IT MAY TAKE 1-2 WEEKS TO SCHEDULE/PROCESS THE REFERRAL. IF YOU HAVE NOT HEARD FROM US/SPECIALIST IN TWO WEEKS, PLEASE GIVE [place] A CALL AT [phone].   THE PATIENT IS ENCOURAGED TO PRACTICE SOCIAL DISTANCING DUE TO THE COVID-19 PANDEMIC.

## 2023-05-08 NOTE — Patient Instructions (Signed)
Type 2 Diabetes Mellitus, Diagnosis, Adult Type 2 diabetes (type 2 diabetes mellitus) is a long-term (chronic) disease. It may happen when there is one or both of these problems: The pancreas does not make enough insulin. The body does not react in a normal way to insulin that it makes. Insulin lets sugars go into cells in your body. If you have type 2 diabetes, sugars cannot get into your cells. Sugars build up in the blood. This causes high blood sugar. What are the causes? The exact cause of this condition is not known. What increases the risk? Having type 2 diabetes in your family. Being overweight or very overweight. Not being active. Your body not reacting in a normal way to the insulin it makes. Having higher than normal blood sugar over time. Having a type of diabetes when you were pregnant. Having a condition that causes small fluid-filled sacs on your ovaries. What are the signs or symptoms? At first, you may have no symptoms. You will get symptoms slowly. They may include: More thirst than normal. More hunger than normal. Needing to pee more than normal. Losing weight without trying. Feeling tired. Feeling weak. Seeing things blurry. Dark patches on your skin. How is this treated? This condition may be treated by a diabetes expert. You may need to: Follow an eating plan made by a food expert (dietitian). Get regular exercise. Find ways to deal with stress. Check blood sugar as often as told. Take medicines. Your doctor will set treatment goals for you. Your blood sugar should be at these levels: Before meals: 80-130 mg/dL (4.4-7.2 mmol/L). After meals: below 180 mg/dL (10 mmol/L). Over the last 2-3 months: less than 7%. Follow these instructions at home: Medicines Take your diabetes medicines or insulin every day. Take medicines as told to help you prevent other problems caused by this condition. You may need: Aspirin. Medicine to lower cholesterol. Medicine to  control blood pressure. Questions to ask your doctor Should I meet with a diabetes educator? What medicines do I need, and when should I take them? What will I need to treat my condition at home? When should I check my blood sugar? Where can I find a support group? Who can I call if I have questions? When is my next doctor visit? General instructions Take over-the-counter and prescription medicines only as told by your doctor. Keep all follow-up visits. Where to find more information For help and guidance and more information about diabetes, please go to: American Diabetes Association (ADA): www.diabetes.org American Association of Diabetes Care and Education Specialists (ADCES): www.diabeteseducator.org International Diabetes Federation (IDF): www.idf.org Contact a doctor if: Your blood sugar is at or above 240 mg/dL (13.3 mmol/L) for 2 days in a row. You have been sick for 2 days or more, and you are not getting better. You have had a fever for 2 days or more, and you are not getting better. You have any of these problems for more than 6 hours: You cannot eat or drink. You feel like you may vomit. You vomit. You have watery poop (diarrhea). Get help right away if: Your blood sugar is lower than 54 mg/dL (3 mmol/L). You feel mixed up (confused). You have trouble thinking clearly. You have trouble breathing. You have medium or large ketone levels in your pee. These symptoms may be an emergency. Get help right away. Call your local emergency services (911 in the [place] Do not wait to see if the symptoms will go away. Do not drive yourself   to the hospital. Summary Type 2 diabetes is a long-term disease. Your pancreas may not make enough insulin, or your body may not react in a normal way to insulin that it makes. This condition is treated with an eating plan, lifestyle changes, and medicines. Your doctor will set treatment goals for you. These will help you keep your blood sugar  in a healthy range. Keep all follow-up visits. This information is not intended to replace advice given to you by your health care provider. Make sure you discuss any questions you have with your health care provider. Document Revised: 09/05/2020 Document Reviewed: 09/05/2020 Elsevier Patient Education  2024 Elsevier Inc.  

## 2023-05-09 LAB — TSH+FREE T4
Free T4: 1.17 ng/dL (ref 0.82–1.77)
TSH: 4.74 u[IU]/mL — ABNORMAL HIGH (ref 0.450–4.500)

## 2023-05-09 LAB — HEMOGLOBIN A1C
Est. average glucose Bld gHb Est-mCnc: 177 mg/dL
Hgb A1c MFr Bld: 7.8 % — ABNORMAL HIGH (ref 4.8–5.6)

## 2023-05-16 ENCOUNTER — Ambulatory Visit: Payer: Medicare HMO | Admitting: Neurology

## 2023-05-16 ENCOUNTER — Encounter: Payer: Self-pay | Admitting: Neurology

## 2023-05-16 VITALS — BP 114/54 | HR 62 | Ht 61.0 in | Wt 177.4 lb

## 2023-05-16 DIAGNOSIS — G40009 Localization-related (focal) (partial) idiopathic epilepsy and epileptic syndromes with seizures of localized onset, not intractable, without status epilepticus: Secondary | ICD-10-CM

## 2023-05-16 MED ORDER — LEVETIRACETAM 500 MG PO TABS: ORAL_TABLET | ORAL | 3 refills | Status: DC

## 2023-05-16 MED ORDER — LAMOTRIGINE 200 MG PO TABS: 200.0000 mg | ORAL_TABLET | Freq: Two times a day (BID) | ORAL | 3 refills | Status: DC

## 2023-05-16 NOTE — Patient Instructions (Addendum)
Always good to see you.  Increase Lamotrigine to 200mg : take 1 tablet twice a day  2. Continue Levetiracetam 500mg : take 2 tablets every night  3. Continue control of sugar levels  4. Follow-up in 6 months, call for any changes.    Seizure Precautions: 1. If medication has been prescribed for you to prevent seizures, take it exactly as directed.  Do not stop taking the medicine without talking to your doctor first, even if you have not had a seizure in a long time.   2. Avoid activities in which a seizure would cause danger to yourself or to others.  Don't operate dangerous machinery, swim alone, or climb in high or dangerous places, such as on ladders, roofs, or girders.  Do not drive unless your doctor says you may.  3. If you have any warning that you may have a seizure, lay down in a safe place where you can't hurt yourself.    4.  No driving for 6 months from last seizure, as per [facility].   Please refer to the following link on the [employer]'s website for more information: http://www.epilepsyfoundation.org/answerplace/Social/driving/drivingu.cfm   5.  Maintain good sleep hygiene.  6.  Contact your doctor if you have any problems that may be related to the medicine you are taking.  7.  Call 911 and bring the patient back to the ED if:        A.  The seizure lasts longer than 5 minutes.       B.  The patient doesn't awaken shortly after the seizure  C.  The patient has new problems such as difficulty seeing, speaking or moving  D.  The patient was injured during the seizure  E.  The patient has a temperature over 102 F (39C)  F.  The patient vomited and now is having trouble breathing

## 2023-05-16 NOTE — Progress Notes (Signed)
NEUROLOGY FOLLOW UP OFFICE NOTE  [name] [id number] [birth year]  HISTORY OF PRESENT ILLNESS: I had the pleasure of seeing [name] in follow-up in the neurology clinic on 05/16/2023.  The patient was last seen a year ago for seizures. She is alone in the office today. Records and images were personally reviewed where available.  Since her last visit, she was in the ER in January 2024 for "behaving abnormally, had some seizure-like episodes, then onset of droop." Per EMS she reported had 3 brief episodes of seizure-like activity without postictal state. MRI brain no acute changes, there was a small remote cortical infarct in the posterior right frontal region, mild chronic microvascular disease. She has had several cardiac issues as well and now takes Eliquis. She was in the ER again on 04/02/23 for seizure. She was sitting at the kitchen then started feeling funny, "not myself." She feels it was similar to her prior seizures, then she lost consciousness. Per reports, she was found to have seizure-like activity. She would have episodes where she was not shaking then would shake again, lasting 10 minutes followed by transient confusion. No tongue bite, incontinence. She fell but did not have any injuries. Lamotrigine level was 7.7, Levetiracetam 36.7. She denies missing medications. Sleep may have been affected by her dog. No alcohol.  She denies any staring/unresponsive episodes. No olfactory/gustatory hallucinations, focal numbness/tingling/weakness. Her feet feel funny when she gets in a bed, Tylenol tablet or rub helps. Balance is bad. Last HbA1c was 7.8. She has a shooting pain in her eye every once in a while. She has dizziness with palpitations, she may be sitting then feel lightheaded like she would fall. She felt it coming on 2 days ago while in the kitchen, feeling like she would pass out. She will be seeing her cardiologist soon. No tremors. She lives with her sister and sister's  family, there is some tension sometimes causing stress.    History on Initial Assessment 12/09/2017: This is a 77 year old right-handed woman with a history of hypertension, hyperlipidemia, diabetes, sleep apnea, drug-induced parkinsonism, presenting to establish care for seizures. She had previously been seeing the [facility] at [name]. She has seen Dr. Arbutus [name] in our office and Sinemet is being weaned off. Her last visit with the Epilepsy clinic was in 2017. Records were reviewed and will be summarized for this visit. She reports the first seizure occurred in March 2009. She was driving at that time and lost consciousness without any prior warning symptoms. Family witnessed episodes of staring off and whole body jerking with foaming at the mouth. Initially she was having episodes every 3 months, then they began to cluster having up to 5 events at a time. She was monitored at [name] in January 2011 where 3 events were captured. One epileptic event localized to the left anterior temporal region. She had 2 additional events with abnormal facial and hand movements without clear EEG changes seen. She was started on Lamotrigine at that time. She had a 3-day ambulatory EEG that was technically limited, with note of a single left temporal spike and multiple push button events with no definite EEG correlate, although EEG was obscured by artifact during some of the events. Her sister has described her going into a trance with abnormal mouth movements (mouth moving quickly), followed by right arm shaking lasting up to 5 minutes, at times with associated urinary incontinence. The last episode was in December 2018. She would  feel lost and dizzy after, no focal weakness. One time she told her sister there was a funny taste in her mouth after a seizure. She is taking Lamotrigine 100mg  BID without side effects. She is also on Lyrica 100mg  BID for back pain, no side effects.    She reports she  feels fine other than she can't get around well due to her feet issues. She has occasional sharp headaches on the left temporal region, she had one the other day lasting a few minutes with no associated nausea/vomiting. She has rare dizziness, no diplopia, dysarthria/dysphagia, neck pain, focal numbness/tingling, bladder dysfunction. She has chronic back pain and pain in both legs, she has had constipation due to iron supplements. She lives with her sister and does not drive.    Epilepsy Risk Factors:  Her sister thinks her mother had seizures due to episodes of right arm shaking/tremors, but as far as she knows was not on seizure medication. Otherwise she had a normal birth and early development.  There is no history of febrile convulsions, CNS infections such as meningitis/encephalitis, significant traumatic brain injury, neurosurgical procedures.   Laboratory Data: Bloodwork from 08/09/15: Lamotrigine level 3.5 EEGs:She was monitored at [name] in January 2011 where 3 events were captured. One epileptic event localized to the left anterior temporal region. She had 2 additional events with abnormal facial and hand movements without clear EEG changes seen. She was started on Lamotrigine at that time. She had a 3-day ambulatory EEG that was technically limited, with note of a single left temporal spike and multiple push button events with no definite EEG correlate, although EEG was obscured by artifact during some of the events EEG in 02/2019 was unremarkable, there was note of a single sharp transient in the left frontotemporal region.  MRI: I personally reviewed MRI brain done 05/2017 which did not show any acute changes. 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 MRI brain without contrast on 02/2019 no acute changes, there was a small chronic right posterior frontal  cortical infarct, mild diffuse atrophy and chronic microvascular disease. There was also note of empty sella with prominent arachnoids pits at the sphenoid wings bilaterally, which can be seen with IIH. She has no other symptoms of IIH, she denies any frequent headaches, no vision changes.   PAST MEDICAL HISTORY: Past Medical History:  Diagnosis Date   Abnormal liver function     in the past.   Anemia    Arthritis    all over   Bruises easily    Cataract    right eye;immature   CHF (congestive heart failure) (HCC)    Chronic back pain    stenosis   Chronic cough    Chronic kidney disease    COPD (chronic obstructive pulmonary disease) (HCC)    COVID    aug/sept 2021   Demand myocardial infarction [name]) 2012   Demand Infarction in setting of Pancreatitis --> mild Troponin elevation, NON-OBSTRUCTIVE CAD   Depression    takes Abilify daily as well as Zoloft   Diastolic heart failure 2010   Grade 1 diastolic Dysfunction by Echo    Diverticulosis    DM (diabetes mellitus) (HCC)    takes Victoza daily as well as Lantus and Humalog   Empty sella (HCC)    on MRI in 2009.   Glaucoma    Headache(784.0)  last migraine-4-68yrs ago   History of blood transfusion    no abnormal reaction noted   History of colon polyps    benign   HTN (hypertension)    takes Benicar,Imdur,and Bystolic daily   Hyperlipidemia    takes Lipitor daily   Joint swelling    Nocturia    Obesity hypoventilation syndrome (HCC)    Obstructive sleep apnea 02/2018   Notably improved split-night study with weight loss from 270 (2017) down to 250 pounds (2019).   Pancreatitis    takes Pancrelipase daily   Parkinson's disease (HCC)    takes Sinemet daily   Peripheral neuropathy    Pneumonia 2012   Seizures (HCC)    takes Lamictal daily and Primidone nightly;last seizure 2wks ago   Urinary urgency    With increased frequency   Varicose veins of both lower extremities with pain    With edema.  Takes daily  Lasix    MEDICATIONS: Current Outpatient Medications on File Prior to Visit  Medication Sig Dispense Refill   acetaminophen (TYLENOL) 500 MG tablet Take 500-1,000 mg by mouth every 4 (four) hours as needed for moderate pain or mild pain.     albuterol (VENTOLIN HFA) 108 (90 Base) MCG/ACT inhaler Inhale 1-2 puffs into the lungs every 6 (six) hours as needed for wheezing or shortness of breath. 18 g 5   Alcaftadine (LASTACAFT) 0.25 % SOLN Place 1-2 drops into both eyes daily as needed (allergy eye/irritation.).     amiodarone (PACERONE) 100 MG tablet Take 1 tablet (100 mg total) by mouth daily. 30 tablet 3   Ascorbic Acid (VITAMIN C) 1000 MG tablet Take 1,000 mg by mouth in the morning.     atorvastatin (LIPITOR) 40 MG tablet Take 1 tablet (40 mg total) by mouth daily. 90 tablet 3   B-D ULTRAFINE III SHORT PEN 31G X 8 MM MISC USE DAILY WITH VICTOZA AND/OR NOVOLOG. 400 each 2   benzonatate (TESSALON) 100 MG capsule Take 1 capsule by mouth three times daily as needed for cough 30 capsule 0   Blood Glucose Monitoring Suppl (TRUE METRIX METER) w/Device KIT USE AS DIRECTED 1 kit 1   carvedilol (COREG) 12.5 MG tablet TAKE 1 TABLET TWICE DAILY 180 tablet 3   diclofenac sodium (VOLTAREN) 1 % GEL Apply 2 g topically 4 (four) times daily as needed (pain).     ELIQUIS 5 MG TABS tablet Take 1 tablet by mouth twice daily 180 tablet 0   FeFum-FePoly-FA-B Cmp-C-Biot (INTEGRA PLUS) CAPS Take 1 capsule by mouth every morning. 30 capsule 2   ferrous sulfate 324 MG TBEC Take 324 mg by mouth daily with breakfast.     furosemide (LASIX) 20 MG tablet TAKE 4 TABLETS TWICE DAILY 720 tablet 3   Glucagon (GVOKE HYPOPEN 2-PACK) 0.5 MG/0.1ML SOAJ INJECT 0.5 MG (1 PEN) INTO THE SKIN DAILY AS NEEDED. 2 mL 11   guaiFENesin-dextromethorphan (ROBITUSSIN DM) 100-10 MG/5ML syrup Take 10 mLs by mouth every 6 (six) hours as needed for cough. 118 mL 0   hydrALAZINE (APRESOLINE) 100 MG tablet Take 1 tablet (100 mg total) by mouth 3  (three) times daily. 270 tablet 3   hydrocortisone cream 1 % Apply 1 application topically daily as needed for itching.      insulin glargine, 1 Unit Dial, (TOUJEO) 300 UNIT/ML Solostar Pen Inject 20 Units into the skin at bedtime.     insulin lispro (HUMALOG KWIKPEN) 100 UNIT/ML KwikPen Inject 0-10 Units into the skin See admin  instructions. Sliding scale:Inject  (high blood sugar over 150). 150-199=2 units;200-249=4 units,250-299=6 units;300-349=8 units;350 above 10 units: Over 350 call physician 15 mL 0   Isopropyl Alcohol (ALCOHOL WIPES) 70 % MISC Apply 1 each topically 2 (two) times daily. 300 each 3   isosorbide mononitrate (IMDUR) 30 MG 24 hr tablet Take 1 tablet (30 mg total) by mouth daily. 90 tablet 3   lamoTRIgine (LAMICTAL) 150 MG tablet TAKE 1 TABLET TWICE DAILY 60 tablet 0   levETIRAcetam (KEPPRA) 500 MG tablet TAKE 2 TABLETS EVERY NIGHT 180 tablet 0   loratadine (CLARITIN) 10 MG tablet Take 1 tablet (10 mg total) by mouth daily. 30 tablet 2   oxyCODONE (OXY IR/ROXICODONE) 5 MG immediate release tablet Take 5 mg by mouth every 6 (six) hours as needed (pain.).     OZEMPIC, 1 MG/DOSE, 4 MG/3ML SOPN INJECT 1MG  UNDER THE SKIN ONE TIME WEEKLY 9 mL 3   Pancrelipase, Lip-Prot-Amyl, 25000-79000 units CPEP Take 2 capsules by mouth in the morning, at noon, and at bedtime.     potassium chloride SA (KLOR-CON M) 20 MEQ tablet Take 2 tablets (40 mEq total) by mouth daily. With additional on metolazone days 300 tablet 3   sennosides-docusate sodium (SENOKOT-S) 8.6-50 MG tablet Take 1-2 tablets by mouth at bedtime as needed for constipation.     sertraline (ZOLOFT) 50 MG tablet TAKE 1 TABLET EVERY DAY 90 tablet 3   tiZANidine (ZANAFLEX) 2 MG tablet TAKE 1 TABLET AT BEDTIME AS NEEDED 30 tablet 3   tolnaftate (TINACTIN) 1 % cream Apply 1 application topically daily as needed (foot fungus).      TRUE METRIX BLOOD GLUCOSE TEST test strip USE AS DIRECTED TO CHECK BLOOD SUGARS 2 TIMES PER DAY 200  strip 10   TRUEplus Lancets 33G MISC TEST BLOOD SUGAR TWICE DAILY AS DIRECTED 200 each 3   Current Facility-Administered Medications on File Prior to Visit  Medication Dose Route Frequency Provider Last Rate Last Admin   0.9 %  sodium chloride infusion (Manually program via Guardrails IV Fluids)   Intravenous Once [place], Jessica M, FNP       0.9 %  sodium chloride infusion (Manually program via Guardrails IV Fluids)   Intravenous Once [place], Jessica M, FNP        ALLERGIES: Allergies  Allergen Reactions   Other Anaphylaxis and Rash    Bleach Cigarette smoke/ Cough   Penicillins Hives    Did it involve swelling of the face/tongue/throat, SOB, or low BP? No Did it involve sudden or severe rash/hives, skin peeling, or any reaction on the inside of your mouth or nose? Yes Did you need to seek medical attention at a [name] or doctor's office? Yes When did it last happen?      Childhood allergy  If all above answers are "NO", may proceed with cephalosporin use.     Sulfa Antibiotics Hives   Aspirin Other (See Comments)     On aspirin 81 mg - Rectal bleeding in dec 2018   Codeine     Headache and makes the patient feel "off"    FAMILY HISTORY: Family History  Problem Relation Age of Onset   Allergies Father    Heart disease Father        before 61   Heart failure Father    Hypertension Father    Hyperlipidemia Father    Heart disease Mother    Diabetes Mother    Hypertension Mother    Hyperlipidemia Mother  Hypertension Sister    Heart disease Sister        before 26   Hyperlipidemia Sister    Diabetes Brother    Hypertension Brother    Diabetes Sister    Hypertension Sister    Diabetes Son    Hypertension Son    Cancer Other     SOCIAL HISTORY: Social History   Socioeconomic History   Marital status: Divorced    Spouse name: Not on file   Number of children: Not on file   Years of education: Not on file   Highest education level: Not on file   Occupational History   Occupation: Disabled    Comment: CNA  Tobacco Use   Smoking status: Former    Current packs/day: 0.50    Average packs/day: 0.5 packs/day for 25.0 years (12.5 ttl pk-yrs)    Types: Cigarettes   Smokeless tobacco: Never   Tobacco comments:    quit smoking 20+ytrs ago  Vaping Use   Vaping status: Never Used  Substance and Sexual Activity   Alcohol use: No   Drug use: No   Sexual activity: Not Currently    Birth control/protection: Surgical  Other Topics Concern   Not on file  Social History Narrative   She lives in [place]. She has lots of family in the area and is accompanied by her sister.   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   She has a distant history of smoking, quit 20 years ago.   Right handed    Social Determinants of Health   Financial Resource Strain: Low Risk  (07/18/2022)   Overall Financial Resource Strain (CARDIA)    Difficulty of Paying Living Expenses: Not hard at all  Food Insecurity: No Food Insecurity (07/18/2022)   Hunger Vital Sign    Worried About Running Out of Food in the Last Year: Never true    Ran Out of Food in the Last Year: Never true  Transportation Needs: Unmet Transportation Needs (11/28/2022)   PRAPARE - Transportation    Lack of Transportation (Medical): Yes    Lack of Transportation (Non-Medical): Yes  Physical Activity: Inactive (07/18/2022)   Exercise Vital Sign    Days of Exercise per Week: 0 days    Minutes of Exercise per Session: 0 min  Stress: No Stress Concern Present (07/18/2022)   [employer] of Occupational Health - Occupational Stress Questionnaire    Feeling of Stress : Not at all  Social Connections: Not on file  Intimate Partner Violence: Not At Risk (10/22/2018)   Humiliation, Afraid, Rape, and Kick questionnaire    Fear of Current or Ex-Partner: No    Emotionally Abused: No    Physically Abused: No    Sexually Abused: No     PHYSICAL  EXAM: Vitals:   05/16/23 0813  BP: (!) 114/54  Pulse: 62  SpO2: 95%   General: No acute distress Head:  Normocephalic/atraumatic Skin/Extremities: No rash, no edema Neurological Exam: alert and awake. No aphasia or dysarthria. Fund of knowledge is appropriate.  Attention and concentration are normal.   Cranial nerves: Pupils equal, round. Extraocular movements intact with no nystagmus. Visual fields full.  No facial asymmetry.  Motor: Bulk and tone normal, muscle strength 5/5 throughout with no pronator drift.   Finger to nose testing intact.  Gait slow and cautious with left foot everted, no ataxia. No tremors.    IMPRESSION: This is a 77 yo RH woman with a history  of hypertension, hyperlipidemia, diabetes, sleep apnea, drug-induced parkinsonism, and seizures. She has had evaluation at [facility] with video EEG capturing one epileptic seizure arising from the left anterior temporal region. She has also had several episodes captured on inpatient and ambulatory EEG with no EEG correlate. 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 She did well for almost 2 years until 2 events this year with seizure-like activity/loss of consciousness (January and 04/02/2023). We discussed increasing Lamotrigine to 200mg  BID, continue Levetiracetam 500mg  2 tabs at bedtime (1000mg  at bedtime). She will discuss dizziness and palpitations with Cardiology. She does not drive. Follow-up in 6 months, call for any changes.   Thank you for allowing me to participate in her care.  Please do not hesitate to call for any questions or concerns.    Patrcia Dolly, M.D.   CC: Dr. Allyne Gee

## 2023-05-19 DIAGNOSIS — R7989 Other specified abnormal findings of blood chemistry: Secondary | ICD-10-CM | POA: Insufficient documentation

## 2023-05-19 NOTE — Assessment & Plan Note (Signed)
Chronic, sx currently stable on sertraline and lamotrigine.

## 2023-05-19 NOTE — Assessment & Plan Note (Addendum)
Chronic, currently on Eliquis due to underlying PAF.

## 2023-05-19 NOTE — Assessment & Plan Note (Signed)
Chronic, appears stable. Importance of dietary/medication compliance was discussed with the patient. She is encouraged to gradually increase her daily activity.

## 2023-05-19 NOTE — Assessment & Plan Note (Signed)
She is encouraged to strive for BMI less than 30 to decrease cardiac risk. Advised to aim for at least 150 minutes of exercise per week.

## 2023-05-19 NOTE — Assessment & Plan Note (Signed)
I will check labs as below. I will treat as indicated.

## 2023-05-19 NOTE — Assessment & Plan Note (Signed)
Chronic, currently on weekly semaglutide 1mg  weekly. Encouraged to gradually increase her daily activity. She will f/u in four months.

## 2023-05-19 NOTE — Assessment & Plan Note (Signed)
Chronic, controlled. She will continue with current meds including carvedilol, isosorbide mononitrate 30mg  daily, furosemide and hydralazine. Importance of dietary compliance was discussed with the patient.

## 2023-05-19 NOTE — Assessment & Plan Note (Signed)
Chronic, as per Podiatry. Encouraged to follow anti-inflammatory diet. She was referred to [facility] by Podiatry in 2021, will request these records.

## 2023-05-20 ENCOUNTER — Other Ambulatory Visit: Payer: Self-pay

## 2023-05-20 MED ORDER — ALBUTEROL SULFATE HFA 108 (90 BASE) MCG/ACT IN AERS: 1.0000 | INHALATION_SPRAY | Freq: Four times a day (QID) | RESPIRATORY_TRACT | 5 refills | Status: DC | PRN

## 2023-05-21 ENCOUNTER — Other Ambulatory Visit (HOSPITAL_COMMUNITY): Payer: Self-pay

## 2023-05-21 DIAGNOSIS — I5032 Chronic diastolic (congestive) heart failure: Secondary | ICD-10-CM

## 2023-05-21 MED ORDER — HYDRALAZINE HCL 100 MG PO TABS
100.0000 mg | ORAL_TABLET | Freq: Three times a day (TID) | ORAL | 3 refills | Status: DC
Start: 2023-05-21 — End: 2024-03-09

## 2023-05-22 ENCOUNTER — Other Ambulatory Visit (HOSPITAL_COMMUNITY): Payer: Self-pay | Admitting: Family Medicine

## 2023-05-22 ENCOUNTER — Other Ambulatory Visit: Payer: Self-pay

## 2023-05-22 MED ORDER — ALBUTEROL SULFATE HFA 108 (90 BASE) MCG/ACT IN AERS: 1.0000 | INHALATION_SPRAY | Freq: Four times a day (QID) | RESPIRATORY_TRACT | 5 refills | Status: AC | PRN

## 2023-05-27 ENCOUNTER — Other Ambulatory Visit (HOSPITAL_COMMUNITY): Payer: Self-pay | Admitting: Cardiology

## 2023-05-27 ENCOUNTER — Other Ambulatory Visit: Payer: Self-pay | Admitting: Nurse Practitioner

## 2023-05-27 ENCOUNTER — Other Ambulatory Visit: Payer: Self-pay | Admitting: Internal Medicine

## 2023-05-31 ENCOUNTER — Telehealth: Payer: Self-pay | Admitting: Pulmonary Disease

## 2023-05-31 NOTE — Telephone Encounter (Signed)
Patient needs an adjustment for her CPAP pressure. It is currently too high. Patient can be reached at [phone]

## 2023-06-03 ENCOUNTER — Inpatient Hospital Stay: Payer: Medicare HMO | Attending: Physician Assistant

## 2023-06-03 ENCOUNTER — Inpatient Hospital Stay (HOSPITAL_BASED_OUTPATIENT_CLINIC_OR_DEPARTMENT_OTHER): Payer: Medicare HMO | Admitting: Internal Medicine

## 2023-06-03 VITALS — BP 140/62 | HR 59 | Temp 97.7°F | Resp 15 | Ht 61.0 in | Wt 184.4 lb

## 2023-06-03 DIAGNOSIS — D500 Iron deficiency anemia secondary to blood loss (chronic): Secondary | ICD-10-CM | POA: Insufficient documentation

## 2023-06-03 DIAGNOSIS — K922 Gastrointestinal hemorrhage, unspecified: Secondary | ICD-10-CM | POA: Diagnosis not present

## 2023-06-03 DIAGNOSIS — Z79899 Other long term (current) drug therapy: Secondary | ICD-10-CM | POA: Diagnosis not present

## 2023-06-03 DIAGNOSIS — D509 Iron deficiency anemia, unspecified: Secondary | ICD-10-CM | POA: Diagnosis present

## 2023-06-03 LAB — CBC WITH DIFFERENTIAL (CANCER CENTER ONLY)
Abs Immature Granulocytes: 0 10*3/uL (ref 0.00–0.07)
Basophils Absolute: 0 10*3/uL (ref 0.0–0.1)
Basophils Relative: 1 %
Eosinophils Absolute: 0.2 10*3/uL (ref 0.0–0.5)
Eosinophils Relative: 5 %
HCT: 29 % — ABNORMAL LOW (ref 36.0–46.0)
Hemoglobin: 9 g/dL — ABNORMAL LOW (ref 12.0–15.0)
Immature Granulocytes: 0 %
Lymphocytes Relative: 35 %
Lymphs Abs: 1.2 10*3/uL (ref 0.7–4.0)
MCH: 23.5 pg — ABNORMAL LOW (ref 26.0–34.0)
MCHC: 31 g/dL (ref 30.0–36.0)
MCV: 75.7 fL — ABNORMAL LOW (ref 80.0–100.0)
Monocytes Absolute: 0.4 10*3/uL (ref 0.1–1.0)
Monocytes Relative: 12 %
Neutro Abs: 1.6 10*3/uL — ABNORMAL LOW (ref 1.7–7.7)
Neutrophils Relative %: 47 %
Platelet Count: 264 10*3/uL (ref 150–400)
RBC: 3.83 MIL/uL — ABNORMAL LOW (ref 3.87–5.11)
RDW: 20.3 % — ABNORMAL HIGH (ref 11.5–15.5)
WBC Count: 3.5 10*3/uL — ABNORMAL LOW (ref 4.0–10.5)
nRBC: 0 % (ref 0.0–0.2)

## 2023-06-03 LAB — SAMPLE TO BLOOD BANK

## 2023-06-03 LAB — IRON AND IRON BINDING CAPACITY (CC-WL,HP ONLY)
Iron: 37 ug/dL (ref 28–170)
Saturation Ratios: 11 % (ref 10.4–31.8)
TIBC: 349 ug/dL (ref 250–450)
UIBC: 312 ug/dL (ref 148–442)

## 2023-06-03 LAB — FERRITIN: Ferritin: 31 ng/mL (ref 11–307)

## 2023-06-03 NOTE — Progress Notes (Signed)
[facility] Telephone:([phone]) [phone]   Fax:([phone]) [phone]  OFFICE PROGRESS NOTE  [name], MD [address] [place] [address]  DIAGNOSIS: Iron deficiency anemia    PRIOR THERAPY: None   CURRENT THERAPY:  1) oral iron supplement OTC tablet p.o. daily 2) IV iron as needed most recent with Ferrlecit 250 mg on 12/04/2022  INTERVAL HISTORY: [name] 76 y.o. female returns to the clinic today for follow-up visit.Discussed the use of AI scribe software for clinical note transcription with the patient, who gave verbal consent to proceed.  History of Present Illness   The patient, a 77 year old with a history of iron deficiency anemia, presents with ongoing fatigue. The fatigue is described as constant, with some days being more severe than others. The patient has received iron infusions (Venofer) in the past, but reports feeling 'tired out' following the treatment.  The patient also reports occasional rectal bleeding, noticed on wiping after bowel movements. She does not feel any hemorrhoids, but the presence of blood suggests possible hemorrhoidal bleeding. The patient is under the care of a gastroenterologist, Dr. Willis Modena, and has had a colonoscopy within the last five years, during which some polyps were identified.  The patient is currently taking over-the-counter ferrous sulfate once daily, along with vitamin C in the morning. Despite this, her hemoglobin levels remain low.        MEDICAL HISTORY: Past Medical History:  Diagnosis Date   Abnormal liver function     in the past.   Anemia    Arthritis    all over   Bruises easily    Cataract    right eye;immature   CHF (congestive heart failure) (HCC)    Chronic back pain    stenosis   Chronic cough    Chronic kidney disease    COPD (chronic obstructive pulmonary disease) (HCC)    COVID    aug/sept 2021   Demand myocardial infarction [facility]) 2012   Demand Infarction in  setting of Pancreatitis --> mild Troponin elevation, NON-OBSTRUCTIVE CAD   Depression    takes Abilify daily as well as Zoloft   Diastolic heart failure 2010   Grade 1 diastolic Dysfunction by Echo    Diverticulosis    DM (diabetes mellitus) (HCC)    takes Victoza daily as well as Lantus and Humalog   Empty sella (HCC)    on MRI in 2009.   Glaucoma    Headache(784.0)    last migraine-4-45yrs ago   History of blood transfusion    no abnormal reaction noted   History of colon polyps    benign   HTN (hypertension)    takes Benicar,Imdur,and Bystolic daily   Hyperlipidemia    takes Lipitor daily   Joint swelling    Nocturia    Obesity hypoventilation syndrome (HCC)    Obstructive sleep apnea 02/2018   Notably improved split-night study with weight loss from 270 (2017) down to 250 pounds (2019).   Pancreatitis    takes Pancrelipase daily   Parkinson's disease (HCC)    takes Sinemet daily   Peripheral neuropathy    Pneumonia 2012   Seizures (HCC)    takes Lamictal daily and Primidone nightly;last seizure 2wks ago   Urinary urgency    With increased frequency   Varicose veins of both lower extremities with pain    With edema.  Takes daily Lasix    ALLERGIES:  is allergic to other, penicillins, sulfa  antibiotics, aspirin, and codeine.  MEDICATIONS:  Current Outpatient Medications  Medication Sig Dispense Refill   acetaminophen (TYLENOL) 500 MG tablet Take 500-1,000 mg by mouth every 4 (four) hours as needed for moderate pain or mild pain.     albuterol (VENTOLIN HFA) 108 (90 Base) MCG/ACT inhaler Inhale 1-2 puffs into the lungs every 6 (six) hours as needed for wheezing or shortness of breath. 17 each 5   Alcaftadine (LASTACAFT) 0.25 % SOLN Place 1-2 drops into both eyes daily as needed (allergy eye/irritation.).     amiodarone (PACERONE) 100 MG tablet TAKE 1 TABLET EVERY DAY 90 tablet 3   Ascorbic Acid (VITAMIN C) 1000 MG tablet Take 1,000 mg by mouth in the morning.      atorvastatin (LIPITOR) 40 MG tablet Take 1 tablet (40 mg total) by mouth daily. 90 tablet 3   B-D ULTRAFINE III SHORT PEN 31G X 8 MM MISC USE DAILY WITH VICTOZA AND/OR NOVOLOG. 400 each 2   benzonatate (TESSALON) 100 MG capsule Take 1 capsule by mouth three times daily as needed for cough 30 capsule 0   Blood Glucose Monitoring Suppl (TRUE METRIX AIR GLUCOSE METER) w/Device KIT USE AS DIRECTED 1 kit 3   carvedilol (COREG) 12.5 MG tablet TAKE 1 TABLET TWICE DAILY 180 tablet 3   diclofenac sodium (VOLTAREN) 1 % GEL Apply 2 g topically 4 (four) times daily as needed (pain).     ELIQUIS 5 MG TABS tablet Take 1 tablet by mouth twice daily 180 tablet 0   FeFum-FePoly-FA-B Cmp-C-Biot (INTEGRA PLUS) CAPS Take 1 capsule by mouth every morning. 30 capsule 2   ferrous sulfate 324 MG TBEC Take 324 mg by mouth daily with breakfast.     furosemide (LASIX) 20 MG tablet TAKE 4 TABLETS TWICE DAILY 720 tablet 3   Glucagon (GVOKE HYPOPEN 2-PACK) 0.5 MG/0.1ML SOAJ INJECT 0.5 MG (1 PEN) INTO THE SKIN DAILY AS NEEDED. 2 mL 11   guaiFENesin-dextromethorphan (ROBITUSSIN DM) 100-10 MG/5ML syrup Take 10 mLs by mouth every 6 (six) hours as needed for cough. 118 mL 0   hydrALAZINE (APRESOLINE) 100 MG tablet Take 1 tablet (100 mg total) by mouth 3 (three) times daily. 270 tablet 3   hydrocortisone cream 1 % Apply 1 application topically daily as needed for itching.      insulin glargine, 1 Unit Dial, (TOUJEO SOLOSTAR) 300 UNIT/ML Solostar Pen INJECT 36 UNITS INTO THE SKIN AT BEDTIME. 9 mL 3   insulin lispro (HUMALOG KWIKPEN) 100 UNIT/ML KwikPen Inject 0-10 Units into the skin See admin instructions. Sliding scale:Inject  (high blood sugar over 150). 150-199=2 units;200-249=4 units,250-299=6 units;300-349=8 units;350 above 10 units: Over 350 call physician 15 mL 0   Isopropyl Alcohol (ALCOHOL WIPES) 70 % MISC Apply 1 each topically 2 (two) times daily. 300 each 3   isosorbide mononitrate (IMDUR) 30 MG 24 hr tablet Take 1 tablet  (30 mg total) by mouth daily. 90 tablet 3   lamoTRIgine (LAMICTAL) 200 MG tablet Take 1 tablet (200 mg total) by mouth 2 (two) times daily. 180 tablet 3   levETIRAcetam (KEPPRA) 500 MG tablet Take 2 tablets every night 180 tablet 3   loratadine (CLARITIN) 10 MG tablet Take 1 tablet (10 mg total) by mouth daily. 30 tablet 2   oxyCODONE (OXY IR/ROXICODONE) 5 MG immediate release tablet Take 5 mg by mouth every 6 (six) hours as needed (pain.).     OZEMPIC, 1 MG/DOSE, 4 MG/3ML SOPN INJECT 1MG  UNDER THE SKIN ONE TIME  WEEKLY 9 mL 3   Pancrelipase, Lip-Prot-Amyl, 25000-79000 units CPEP Take 2 capsules by mouth in the morning, at noon, and at bedtime.     potassium chloride SA (KLOR-CON M) 20 MEQ tablet TAKE 2 TABLETS EVERY DAY, WITH ADDITIONAL ON METOLAZONE DAYS 300 tablet 3   sennosides-docusate sodium (SENOKOT-S) 8.6-50 MG tablet Take 1-2 tablets by mouth at bedtime as needed for constipation.     sertraline (ZOLOFT) 50 MG tablet TAKE 1 TABLET EVERY DAY 90 tablet 3   tiZANidine (ZANAFLEX) 2 MG tablet TAKE 1 TABLET AT BEDTIME AS NEEDED 30 tablet 11   tolnaftate (TINACTIN) 1 % cream Apply 1 application topically daily as needed (foot fungus).      TRUE METRIX BLOOD GLUCOSE TEST test strip USE AS DIRECTED TO CHECK BLOOD SUGARS 2 TIMES PER DAY 200 strip 3   TRUEplus Lancets 33G MISC TEST BLOOD SUGAR TWICE DAILY AS DIRECTED 200 each 3   No current facility-administered medications for this visit.   Facility-Administered Medications Ordered in Other Visits  Medication Dose Route Frequency Provider Last Rate Last Admin   0.9 %  sodium chloride infusion (Manually program via Guardrails IV Fluids)   Intravenous Once [place], Anderson Malta, FNP       0.9 %  sodium chloride infusion (Manually program via Guardrails IV Fluids)   Intravenous Once [place], Anderson Malta, FNP        SURGICAL HISTORY:  Past Surgical History:  Procedure Laterality Date   ABDOMINAL HYSTERECTOMY     APPENDECTOMY     ATRIAL SEPTAL  DEFECT(ASD) CLOSURE N/A 01/02/2023   Procedure: ATRIAL SEPTAL DEFECT(ASD) CLOSURE;  Surgeon: Tonny Bollman, MD;  Location: [facility] INVASIVE CV LAB;  Service: Cardiovascular;  Laterality: N/A;   BACK SURGERY     Cardiac Event Monitor  September-October 2017   Sinus rhythm with occasional PACs and artifact. No arrhythmias besides one short run of tachycardia.   CARDIOVERSION N/A 08/15/2022   Procedure: CARDIOVERSION;  Surgeon: Laurey Morale, MD;  Location: [facility] ENDOSCOPY;  Service: Cardiovascular;  Laterality: N/A;   CARDIOVERSION N/A 02/04/2023   Procedure: CARDIOVERSION;  Surgeon: Laurey Morale, MD;  Location: [facility] INVASIVE CV LAB;  Service: Cardiovascular;  Laterality: N/A;   CHOLECYSTECTOMY     COLONOSCOPY N/A 08/15/2012   Procedure: COLONOSCOPY;  Surgeon: Willis Modena, MD;  Location: [facility] ENDOSCOPY;  Service: Endoscopy;  Laterality: N/A;   COLONOSCOPY WITH PROPOFOL Left 06/17/2017   Procedure: COLONOSCOPY WITH PROPOFOL;  Surgeon: Kerin Salen, MD;  Location: [facility] ENDOSCOPY;  Service: Gastroenterology;  Laterality: Left;   COLONOSCOPY WITH PROPOFOL N/A 04/03/2021   Procedure: COLONOSCOPY WITH PROPOFOL;  Surgeon: Vida Rigger, MD;  Location: [facility] ENDOSCOPY;  Service: Endoscopy;  Laterality: N/A;   CORONARY CALCIUM SCORE AND CTA  04/2018   Coronary calcium score 71.9.  Very large, hyperdynamic LAD wrapping apex giving rise to PDA.  Moderate LAD-diagonal and circumflex plaque -> CT FFR suggested positive findings in distal D1, D2 and distal circumflex.  Referred for cath. ==> FALSE POSITIVE   ESOPHAGOGASTRODUODENOSCOPY     eye cysts Bilateral    LASIK     LEFT HEART CATH AND CORONARY ANGIOGRAPHY N/A 05/16/2018   Procedure: LEFT HEART CATH AND CORONARY ANGIOGRAPHY;  Surgeon: Lyn Records, MD;  Location: [facility] INVASIVE CV LAB:  Angiographically normal coronary arteries with LVEDP of 21 mmHg.  Large draping hyperdominant LAD that wraps the apex and provides distal half of the PDA.  Relatively small caliber  distal Cx -->proxLPDA, non-dom RCA.  No Cx or Diag lesions -- FALSE + CT FFR   LEFT HEART CATH AND CORONARY ANGIOGRAPHY  2009/March 2012   2009: (Dr. Janene Harvey) Nonobstructive CAD; 2012: Minimal CAD --> false positive stress test   NM MYOVIEW LTD  01/2016; 01/2018   a)LOW RISK. No ischemia or infarction;; b) EF 55-60%. NO ST changes.  No ischemia or Infarction. NO significant RV enlargement.  LOW RISK.   POLYPECTOMY  04/03/2021   Procedure: POLYPECTOMY;  Surgeon: Vida Rigger, MD;  Location: [facility] ENDOSCOPY;  Service: Endoscopy;;   Polysomnogram  02/2018   (Dr. Vickey Huger from neurology): Split study was not adequately done due to low AHI.  She slept reclined and had an AHI less than 10, prolonged hypoxemia and no hypercapnia. -->  She has home oxygen already prescribed, but did not meet criteria for nightly oxygen.  (Was noted that the patient did not cooperate well with study.  Titration was discussed.   PRESSURE SENSOR/CARDIOMEMS N/A 11/09/2019   Procedure: PRESSURE SENSOR/CARDIOMEMS;  Surgeon: Laurey Morale, MD;  Location: [facility] INVASIVE CV LAB;  Service: Cardiovascular;  Laterality: N/A;   RECTAL POLYPECTOMY     RIGHT HEART CATH N/A 09/07/2022   Procedure: RIGHT HEART CATH;  Surgeon: Laurey Morale, MD;  Location: [facility] INVASIVE CV LAB;  Service: Cardiovascular;  Laterality: N/A;   RIGHT/LEFT HEART CATH AND CORONARY ANGIOGRAPHY N/A 04/06/2019   Procedure: RIGHT/LEFT HEART CATH AND CORONARY ANGIOGRAPHY;  Surgeon: Marykay Lex, MD;  Location: [facility] INVASIVE CV LAB; angiographically normal coronary arteries.  Moderately elevated AD LVEDP..  Mild pulmonary hypertension: PA P 56/14 mmHg-mean 31 mmHg.  RAP 10 mmHg.  RV P-EDP 54/4 mmHg - 12 mmHg.  PCWP 11-15 mmHg.  CO-CI: 7.35-3.73.   TEE WITHOUT CARDIOVERSION N/A 08/15/2022   Procedure: TRANSESOPHAGEAL ECHOCARDIOGRAM (TEE);  Surgeon: Laurey Morale, MD;  Location: [facility] ENDOSCOPY;  Service: Cardiovascular;  Laterality: N/A;   TONSILLECTOMY     TRANSTHORACIC  ECHOCARDIOGRAM  01/2016; 05/2017   A) Normal LV chamber size with mod LVH pattern. EF 50-55%. Severe LA dilation. Mod RA dilation. PAP elevated at 38 mmHg;; B) Mild concentric LVH.  EF 55-60% & no RWMA.  Grade 2 DD.  Diastolic flattening of the ventricular septum == ? elevated PAP.  Mild aortic valve calcification/sclerosis.  Mod LA dilation.  Mild RV dilation & Mod RA dilation   TRANSTHORACIC ECHOCARDIOGRAM  10/2017; 01/2018   a) EF 55-60%.  No RWMA,  Moderate LA dilation.  Mild LA dilation.  Peak PA pressures ~57 mmHg (moderate);;; b) EF 55-60%.  GRII DD.  Suggestion of RV volume overload with moderate RV dilation.  Severe LA and RA dilation.Marland Kitchen  PA pressure ~67%.   TRANSTHORACIC ECHOCARDIOGRAM  01/2019   Normal LV size and function.  EF 50 to 55%.  Impaired relaxation.  RV appears to have moderately reduced function.  Severely enlarged.  Cannot fully assess RV pressures.  Hypermobile interatrial septum.  Right atrium severely dilated.   TUBAL LIGATION     vaginal cyst removed     several times    REVIEW OF SYSTEMS:  A comprehensive review of systems was negative except for: Constitutional: positive for fatigue   PHYSICAL EXAMINATION: General appearance: alert, cooperative, fatigued, and no distress Head: Normocephalic, without obvious abnormality, atraumatic Neck: no adenopathy, no JVD, supple, symmetrical, trachea midline, and thyroid not enlarged, symmetric, no tenderness/mass/nodules Lymph nodes: Cervical, supraclavicular, and axillary nodes normal. Resp: clear to auscultation bilaterally Back: symmetric, no curvature. ROM normal. No CVA tenderness. Cardio:  regular rate and rhythm, S1, S2 normal, no murmur, click, rub or gallop GI: soft, non-tender; bowel sounds normal; no masses,  no organomegaly Extremities: extremities normal, atraumatic, no cyanosis or edema  ECOG PERFORMANCE STATUS: 1 - Symptomatic but completely ambulatory  Blood pressure (!) 140/62, pulse (!) 59, temperature 97.7  F (36.5 C), temperature source Temporal, resp. rate 15, height 5\' 1"  (1.549 m), weight 184 lb 6.4 oz (83.6 kg), SpO2 99%.  LABORATORY DATA: Lab Results  Component Value Date   WBC 3.5 (L) 06/03/2023   HGB 9.0 (L) 06/03/2023   HCT 29.0 (L) 06/03/2023   MCV 75.7 (L) 06/03/2023   PLT 264 06/03/2023      Chemistry      Component Value Date/Time   NA 135 04/04/2023 1310   NA 140 12/26/2022 1107   K 3.8 04/04/2023 1310   CL 98 04/04/2023 1310   CO2 31 04/04/2023 1310   BUN 13 04/04/2023 1310   BUN 13 12/26/2022 1107   CREATININE 0.97 04/04/2023 1310   CREATININE 0.91 10/04/2016 1144      Component Value Date/Time   CALCIUM 9.2 04/04/2023 1310   ALKPHOS 124 04/04/2023 1310   AST 29 04/04/2023 1310   ALT 23 04/04/2023 1310   BILITOT 0.3 04/04/2023 1310       RADIOGRAPHIC STUDIES: No results found.  ASSESSMENT AND PLAN: This is a very pleasant 77 years old African-American female with iron deficiency anemia secondary to gastrointestinal blood loss.  She is status post iron infusion with Venofer in June 2024 and she is currently on oral iron tablet on daily basis.  CBC today showed persistent anemia with hemoglobin of 9.0 and hematocrit 29.0.  Iron studies showed normal serum iron of 37 with iron saturation of 11% and ferritin level of 31. Assessment and Plan    Iron Deficiency Anemia Iron deficiency anemia with persistent fatigue. Currently on ferrous sulfate once daily with vitamin C. Reports intermittent rectal bleeding, likely due to hemorrhoids. Last colonoscopy within five years showed polyps. Emphasized taking ferrous sulfate with orange juice for better absorption. - Follow up in three months - Administer iron infusion if ferritin is low - Continue iron tablets if ferritin is normal - Repeat blood work and iron study in three months.   The patient was advised to call immediately if she has any other concerning symptoms in the interval. The patient voices understanding  of current disease status and treatment options and is in agreement with the current care plan.  All questions were answered. The patient knows to call the clinic with any problems, questions or concerns. We can certainly see the patient much sooner if necessary.  The total time spent in the appointment was 20 minutes.  Disclaimer: This note was dictated with voice recognition software. Similar sounding words can inadvertently be transcribed and may not be corrected upon review.

## 2023-06-05 ENCOUNTER — Telehealth: Payer: Self-pay | Admitting: Internal Medicine

## 2023-06-05 NOTE — Telephone Encounter (Signed)
Left patient a message in regards to scheduled appointment times/dates; left call back number if needed for rescheduling

## 2023-06-10 ENCOUNTER — Ambulatory Visit (HOSPITAL_COMMUNITY)
Admission: RE | Admit: 2023-06-10 | Discharge: 2023-06-10 | Disposition: A | Discharge status: Home / Self Care | Payer: Medicare HMO | Source: Ambulatory Visit | Attending: Cardiology | Admitting: Cardiology

## 2023-06-10 ENCOUNTER — Encounter (HOSPITAL_COMMUNITY): Payer: Self-pay | Admitting: Cardiology

## 2023-06-10 VITALS — BP 110/60 | HR 60 | Wt 185.0 lb

## 2023-06-10 DIAGNOSIS — Z9889 Other specified postprocedural states: Secondary | ICD-10-CM | POA: Insufficient documentation

## 2023-06-10 DIAGNOSIS — I5032 Chronic diastolic (congestive) heart failure: Secondary | ICD-10-CM | POA: Insufficient documentation

## 2023-06-10 DIAGNOSIS — Z87891 Personal history of nicotine dependence: Secondary | ICD-10-CM | POA: Insufficient documentation

## 2023-06-10 DIAGNOSIS — I110 Hypertensive heart disease with heart failure: Secondary | ICD-10-CM | POA: Diagnosis not present

## 2023-06-10 DIAGNOSIS — I4891 Unspecified atrial fibrillation: Secondary | ICD-10-CM | POA: Insufficient documentation

## 2023-06-10 DIAGNOSIS — Z5982 Transportation insecurity: Secondary | ICD-10-CM | POA: Insufficient documentation

## 2023-06-10 DIAGNOSIS — K625 Hemorrhage of anus and rectum: Secondary | ICD-10-CM | POA: Diagnosis not present

## 2023-06-10 DIAGNOSIS — I272 Pulmonary hypertension, unspecified: Secondary | ICD-10-CM | POA: Insufficient documentation

## 2023-06-10 DIAGNOSIS — Z79899 Other long term (current) drug therapy: Secondary | ICD-10-CM | POA: Insufficient documentation

## 2023-06-10 DIAGNOSIS — Z7985 Long-term (current) use of injectable non-insulin antidiabetic drugs: Secondary | ICD-10-CM | POA: Insufficient documentation

## 2023-06-10 DIAGNOSIS — J439 Emphysema, unspecified: Secondary | ICD-10-CM | POA: Insufficient documentation

## 2023-06-10 DIAGNOSIS — Z8249 Family history of ischemic heart disease and other diseases of the circulatory system: Secondary | ICD-10-CM | POA: Insufficient documentation

## 2023-06-10 DIAGNOSIS — I484 Atypical atrial flutter: Secondary | ICD-10-CM | POA: Diagnosis not present

## 2023-06-10 DIAGNOSIS — Z8616 Personal history of COVID-19: Secondary | ICD-10-CM | POA: Diagnosis not present

## 2023-06-10 DIAGNOSIS — G4733 Obstructive sleep apnea (adult) (pediatric): Secondary | ICD-10-CM | POA: Diagnosis not present

## 2023-06-10 DIAGNOSIS — E669 Obesity, unspecified: Secondary | ICD-10-CM | POA: Insufficient documentation

## 2023-06-10 DIAGNOSIS — Z7901 Long term (current) use of anticoagulants: Secondary | ICD-10-CM | POA: Insufficient documentation

## 2023-06-10 DIAGNOSIS — Z6834 Body mass index (BMI) 34.0-34.9, adult: Secondary | ICD-10-CM | POA: Insufficient documentation

## 2023-06-10 LAB — COMPREHENSIVE METABOLIC PANEL
ALT: 18 U/L (ref 0–44)
AST: 22 U/L (ref 15–41)
Albumin: 3.6 g/dL (ref 3.5–5.0)
Alkaline Phosphatase: 107 U/L (ref 38–126)
Anion gap: 7 (ref 5–15)
BUN: 18 mg/dL (ref 8–23)
CO2: 29 mmol/L (ref 22–32)
Calcium: 9.4 mg/dL (ref 8.9–10.3)
Chloride: 100 mmol/L (ref 98–111)
Creatinine, Ser: 0.85 mg/dL (ref 0.44–1.00)
GFR, Estimated: >60 mL/min (ref >60)
Glucose, Bld: 71 mg/dL (ref 70–99)
Potassium: 3.5 mmol/L (ref 3.5–5.1)
Sodium: 136 mmol/L (ref 135–145)
Total Bilirubin: 0.3 mg/dL (ref <1.2)
Total Protein: 7.3 g/dL (ref 6.5–8.1)

## 2023-06-10 LAB — BRAIN NATRIURETIC PEPTIDE: B Natriuretic Peptide: 56.4 pg/mL (ref 0.0–100.0)

## 2023-06-10 LAB — T4, FREE: Free T4: 0.86 ng/dL (ref 0.61–1.12)

## 2023-06-10 LAB — TSH: TSH: 4.587 u[IU]/mL — ABNORMAL HIGH (ref 0.350–4.500)

## 2023-06-10 MED ORDER — POTASSIUM CHLORIDE CRYS ER 20 MEQ PO TBCR: 60.0000 meq | EXTENDED_RELEASE_TABLET | Freq: Every day | ORAL | 3 refills | Status: DC

## 2023-06-10 MED ORDER — FUROSEMIDE 20 MG PO TABS: 100.0000 mg | ORAL_TABLET | Freq: Two times a day (BID) | ORAL | 3 refills | Status: DC

## 2023-06-10 NOTE — Progress Notes (Signed)
PCP: [name], MD Cardiology: Dr. Herbie Baltimore HF Cardiology: Dr. Shirlee Latch  77 y.o. with history of OHS/OSA and RV dysfunction was referred by Dr. Herbie Baltimore for evaluation of CHF.  She has a history of OSA/OHS.  She uses CPAP at night but not currently using oxygen during the day.  Remote smoker.  She had an echo in 9/20 showing EF 50-55% but moderate-severe RV dysfunction with mild-moderate RV dilation.  RHC/LHC in 10/20 showed normal coronaries, pulmonary venous hypertension.  V/Q scan in 2/21 did not show evidence for acute on chronic PE.    Cardiomems was placed in 5/21.  RHC at that time showed normal filling pressures and moderate pulmonary hypertension but suspect due to high output.  No evidence for shunt lesion.  Echo showed EF 60-65%, enlarged RV with normal systolic function.   She was admitted in 5/21 with syncope, thought to be due to a seizure.    She had COVID-19 infection in 8/21. 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   Echo in 9/21 with EF 50-55%, severe RV enlargement and severely decreased RV systolic function, PASP 54 mmHg.    She was admitted in 5/22 with CHF, bronchitis.  She was diuresed and treated with antibiotics. Echo in 5/22 showed EF 60-65%, mild LVH, ?normal RV, severe LAE, ?normal PA pressure, IVC normal.  V/Q scan negative.   In 10/22, she was admitted with GI bleeding, thought to be diverticular.  BP meds were decreased and Lasix was cut back.  With increased weight, she increased her Lasix back to 80 mg daily.   She was seen in the ER in 1/24 with atypical atrial flutter, this was new.  She was started on apixaban and sent home from ER. She was seen for follow up and planned to set her up for DCCV, but she was admitted on 07/25/22 with GI bleed, thought to be diverticular.  Apixaban was held and she was tranfused. Apixaban was restarted after discharge.  She did  not have endoscopy.   Follow up 08/01/22, she remained in atypical atrial flutter. Arranged for TEE/DCCV.  TEE/DCCV showed EF 60-65%, mild LVH, RV normal, small ASD to L->R flow, no LAA thrombus. S/p DCCV --> NSR.  RHC (3/24) showed normal filling pressures, moderate pulmonary hypertension in setting of ASD, Qp/Qs 1.5/1. She was referred to Structural Heart team for ASD repair consideration.  S/p ASD repair 7/24. Ltd echo 7/24 showed EF 55-60%, RV function moderately reduced.  Follow up 7/24, found to be back in AFL, rate controlled. Amiodarone started, and underwent DCCV 02/04/23 to NSR.  Ltd Echo with bubble study 8/24 showed EF 60-65%, G2DD, normal RV, PASP 44.5 mmhg, no evidence of shunting, IVC dilated.  Today she returns for followup of CHF.  She is in NSR today, no palpitations.  Weight up 4 lbs. She is sleepy during the day but has not had her CPAP in a month, waiting for a new hose.  She uses a cane for balance, no dyspnea walking on flat ground.  She is short of breath with hills and stairs.  Rare atypical chest pain.  No lightheadedness.   ECG (personally reviewed): NSR, 1st degree AVB, iRBBB  Cardiomems: PADP 21, goal 20  Labs (10/20): K 4.1, creatinine 1.15, hgb 11.4 Labs (6/21): K 3.5, creatinine 1.04, BNP 81 Labs (12/21): K 3.7, creatinine 0.79 Labs (11/22): LDL 63, BNP 33 Labs (2/23): K 3.8, creatinine 0.97  Labs (9/23): K 4.1, creatinine 0.97 Labs (1/24): K 3.5, creatinine 0.92, BNP 167 Labs (2/24): transferrin saturation 7%, hgb 8.1 Labs (3/24): K 4.0, creatinine 0.99 Labs (7/24): K 3.5, creatinine 0.90 Labs (10/24): LFTs normal, K 3.8, creatinine 0.97 Labs (11/24): TSH mildly elevated 4.7 Labs (12/24): hgb 9  PMH: 1. OHS/OSA: She uses CPAP.  2. H/o CVA 3. Seizure disorder 4. Type 2 DM 5. Depression 6. HTN 7. Hyperlipidemia 8. Chronic diastolic CHF: Echo (9/20) with EF 50-55%, moderate to severe RV dilation with mild-moderately decreased RV systolic function.   - RHC/LHC (10/20): normal coronaries; mean RA 10, PA 56/14 mean 31, mean PCWP 13, LVEDP 18, CO/CI 7.35/3.73, PVR 2.44.  - RHC/Cardiomems placement (5/21): mean RA 7, PA 58/21 mean 34, PCWP mean 12, CI 8.3 F/3.95 T, PVR 2.86 WU. No evidence for shunt lesion.  - Echo (5/21): EF 60-65%, RV enlarged with normal systolic function.  - Echo (9/21): EF 50-55%, severe RV enlargement and severely decreased RV systolic function, PASP 54 mmHg.  - Echo (5/22): EF 60-65%, mild LVH, ?normal RV, severe LAE, ?normal PA pressure, IVC normal. - TEE (2/23): EF 60-65%, mild LVH, RV normal - RHC (3/24): RA mean 6, PA 57/12 (mean 31), PCWP mean 13, CO/CI (Fick) 17/9.4, Qp/Qs 1.5/1 - Ltd Echo with bubble study (8/24):EF 60-65%, G2DD, normal RV, PASP 44.5 mmhg, moderately decreased RV systolic function, no evidence of shunting, IVC dilated. 9. Primarily pulmonary venous hypertension.  - V/Q scan (2/21, 5/22): No evidence for acute or chronic PE.  - PFTs normal in 5/21.  10. COVID-19 PNA 8/21.  - CT chest in 10/21 with emphysema and mild fibrosis concerning for post-infectious inflammatory fibrosis from COVID.  11. GI bleeding: 10/22, diverticular bleed.  - Presumed diverticular bleed in 1/24.  12. Atypical atrial flutter/atrial fibrillation: First noted 1/24.  - TEE/DCCV (2/24) to NSR - DCCV (8/24) to NSR 13. ASD: RHC (3/24) showed Qp/Qs 1.5/1, suspect hemodynamically significant ASD. - s/p ASD closure (7/24) - Ltd echo w/ bubble study (8/24) showed no evidence of shunting, stable well-seated ASD closure device.  Social History   Socioeconomic History   Marital status: Divorced    Spouse name: Not on file   Number of children: Not on file   Years of education: Not on file   Highest education level: Not on file  Occupational History   Occupation: Disabled    Comment: CNA  Tobacco Use   Smoking status: Former    Current packs/day: 0.50    Average packs/day: 0.5 packs/day for 25.0 years (12.5 ttl pk-yrs)     Types: Cigarettes   Smokeless tobacco: Never   Tobacco comments:    quit smoking 20+ytrs ago  Vaping Use   Vaping status: Never Used  Substance and Sexual Activity   Alcohol use: No   Drug use: No   Sexual activity: Not Currently    Birth control/protection: Surgical  Other Topics Concern   Not on file  Social History Narrative   She lives in [place]. She has lots of family in the area and is accompanied by her sister.   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   She has a distant history of smoking, quit 20 years ago.   Right handed    Social Drivers of Health   Financial Resource Strain: Low Risk  (07/18/2022)   Overall Financial Resource Strain (CARDIA)    Difficulty of Paying Living Expenses: Not hard at all  Food  Insecurity: No Food Insecurity (07/18/2022)   Hunger Vital Sign    Worried About Running Out of Food in the Last Year: Never true    Ran Out of Food in the Last Year: Never true  Transportation Needs: Unmet Transportation Needs (11/28/2022)   PRAPARE - Transportation    Lack of Transportation (Medical): Yes    Lack of Transportation (Non-Medical): Yes  Physical Activity: Inactive (07/18/2022)   Exercise Vital Sign    Days of Exercise per Week: 0 days    Minutes of Exercise per Session: 0 min  Stress: No Stress Concern Present (07/18/2022)   [employer] of Occupational Health - Occupational Stress Questionnaire    Feeling of Stress : Not at all  Social Connections: Not on file  Intimate Partner Violence: Not At Risk (10/22/2018)   Humiliation, Afraid, Rape, and Kick questionnaire    Fear of Current or Ex-Partner: No    Emotionally Abused: No    Physically Abused: No    Sexually Abused: No   Family History  Problem Relation Age of Onset   Allergies Father    Heart disease Father        before 73   Heart failure Father    Hypertension Father    Hyperlipidemia Father    Heart disease Mother    Diabetes Mother     Hypertension Mother    Hyperlipidemia Mother    Hypertension Sister    Heart disease Sister        before 54   Hyperlipidemia Sister    Diabetes Brother    Hypertension Brother    Diabetes Sister    Hypertension Sister    Diabetes Son    Hypertension Son    Cancer Other    Current Outpatient Medications  Medication Sig Dispense Refill   acetaminophen (TYLENOL) 500 MG tablet Take 500-1,000 mg by mouth every 4 (four) hours as needed for moderate pain or mild pain.     albuterol (VENTOLIN HFA) 108 (90 Base) MCG/ACT inhaler Inhale 1-2 puffs into the lungs every 6 (six) hours as needed for wheezing or shortness of breath. 17 each 5   Alcaftadine (LASTACAFT) 0.25 % SOLN Place 1-2 drops into both eyes daily as needed (allergy eye/irritation.).     amiodarone (PACERONE) 100 MG tablet TAKE 1 TABLET EVERY DAY 90 tablet 3   Ascorbic Acid (VITAMIN C) 1000 MG tablet Take 1,000 mg by mouth in the morning.     atorvastatin (LIPITOR) 40 MG tablet Take 1 tablet (40 mg total) by mouth daily. 90 tablet 3   B-D ULTRAFINE III SHORT PEN 31G X 8 MM MISC USE DAILY WITH VICTOZA AND/OR NOVOLOG. 400 each 2   benzonatate (TESSALON) 100 MG capsule Take 1 capsule by mouth three times daily as needed for cough 30 capsule 0   Blood Glucose Monitoring Suppl (TRUE METRIX AIR GLUCOSE METER) w/Device KIT USE AS DIRECTED 1 kit 3   carvedilol (COREG) 12.5 MG tablet TAKE 1 TABLET TWICE DAILY 180 tablet 3   diclofenac sodium (VOLTAREN) 1 % GEL Apply 2 g topically 4 (four) times daily as needed (pain).     ELIQUIS 5 MG TABS tablet Take 1 tablet by mouth twice daily 180 tablet 0   FeFum-FePoly-FA-B Cmp-C-Biot (INTEGRA PLUS) CAPS Take 1 capsule by mouth every morning. 30 capsule 2   ferrous sulfate 324 MG TBEC Take 324 mg by mouth daily with breakfast.     Glucagon (GVOKE HYPOPEN 2-PACK) 0.5 MG/0.1ML SOAJ INJECT 0.5 MG (  1 PEN) INTO THE SKIN DAILY AS NEEDED. 2 mL 11   guaiFENesin-dextromethorphan (ROBITUSSIN DM) 100-10 MG/5ML  syrup Take 10 mLs by mouth every 6 (six) hours as needed for cough. 118 mL 0   hydrALAZINE (APRESOLINE) 100 MG tablet Take 1 tablet (100 mg total) by mouth 3 (three) times daily. 270 tablet 3   hydrocortisone cream 1 % Apply 1 application topically daily as needed for itching.      Insulin Glargine (TOUJEO SOLOSTAR [place]) Inject 20 Units into the skin at bedtime.     insulin lispro (HUMALOG KWIKPEN) 100 UNIT/ML KwikPen Inject 0-10 Units into the skin See admin instructions. Sliding scale:Inject  (high blood sugar over 150). 150-199=2 units;200-249=4 units,250-299=6 units;300-349=8 units;350 above 10 units: Over 350 call physician 15 mL 0   Isopropyl Alcohol (ALCOHOL WIPES) 70 % MISC Apply 1 each topically 2 (two) times daily. 300 each 3   isosorbide mononitrate (IMDUR) 30 MG 24 hr tablet Take 1 tablet (30 mg total) by mouth daily. 90 tablet 3   lamoTRIgine (LAMICTAL) 200 MG tablet Take 1 tablet (200 mg total) by mouth 2 (two) times daily. 180 tablet 3   levETIRAcetam (KEPPRA) 500 MG tablet Take 2 tablets every night 180 tablet 3   loratadine (CLARITIN) 10 MG tablet Take 1 tablet (10 mg total) by mouth daily. 30 tablet 2   oxyCODONE (OXY IR/ROXICODONE) 5 MG immediate release tablet Take 5 mg by mouth every 6 (six) hours as needed (pain.).     OZEMPIC, 1 MG/DOSE, 4 MG/3ML SOPN INJECT 1MG  UNDER THE SKIN ONE TIME WEEKLY 9 mL 3   Pancrelipase, Lip-Prot-Amyl, 25000-79000 units CPEP Take 2 capsules by mouth in the morning, at noon, and at bedtime.     sennosides-docusate sodium (SENOKOT-S) 8.6-50 MG tablet Take 1-2 tablets by mouth at bedtime as needed for constipation.     sertraline (ZOLOFT) 50 MG tablet TAKE 1 TABLET EVERY DAY 90 tablet 3   tiZANidine (ZANAFLEX) 2 MG tablet TAKE 1 TABLET AT BEDTIME AS NEEDED 30 tablet 11   tolnaftate (TINACTIN) 1 % cream Apply 1 application topically daily as needed (foot fungus).      TRUE METRIX BLOOD GLUCOSE TEST test strip USE AS DIRECTED TO CHECK BLOOD SUGARS 2 TIMES  PER DAY 200 strip 3   TRUEplus Lancets 33G MISC TEST BLOOD SUGAR TWICE DAILY AS DIRECTED 200 each 3   furosemide (LASIX) 20 MG tablet Take 5 tablets (100 mg total) by mouth 2 (two) times daily. 720 tablet 3   potassium chloride SA (KLOR-CON M) 20 MEQ tablet Take 3 tablets (60 mEq total) by mouth daily. 300 tablet 3   No current facility-administered medications for this encounter.   Facility-Administered Medications Ordered in Other Encounters  Medication Dose Route Frequency Provider Last Rate Last Admin   0.9 %  sodium chloride infusion (Manually program via Guardrails IV Fluids)   Intravenous Once [place], Jessica M, FNP       0.9 %  sodium chloride infusion (Manually program via Guardrails IV Fluids)   Intravenous Once [place], FNP       Wt Readings from Last 3 Encounters:  06/10/23 83.9 kg (185 lb)  06/03/23 83.6 kg (184 lb 6.4 oz)  05/16/23 80.5 kg (177 lb 6.4 oz)   BP 110/60   Pulse 60   Wt 83.9 kg (185 lb)   SpO2 97%   BMI 34.96 kg/m  General: NAD Neck: JVP 8 cm, no thyromegaly or thyroid nodule.  Lungs:  Clear to auscultation bilaterally with normal respiratory effort. CV: Nondisplaced PMI.  Heart regular S1/S2, no S3/S4, 1/6 SEM RUSB.  No peripheral edema.  No carotid bruit.  Normal pedal pulses.  Abdomen: Soft, nontender, no hepatosplenomegaly, no distention.  Skin: Intact without lesions or rashes.  Neurologic: Alert and oriented x 3.  Psych: Normal affect. Extremities: No clubbing or cyanosis.  HEENT: Normal.   Assessment/Plan: 1. Chronic diastolic CHF/RV failure: Echo in 9/20 showed EF 50-55% with moderate-severe RV dilation and mild-moderately decreased systolic function. RHC/LHC showed mild-moderate pulmonary venous hypertension and no significant coronary disease.  Cause of RV dysfunction is not clear, possibly due to diastolic CHF + OHS/OSA.  V/Q scan in 2/21 did not show acute or chronic PE.  Cardiomems was placed in 5/21, normal filling pressures with  moderate pulmonary hypertension likely due to high output (no evidence for shunt lesion on RHC).  Echo in 9/21 with EF 50-55%, severe RVE with severe RV dysfunction.  Echo in 5/22 with EF 60-65%, RV function reportedly normal with normal PA pressure.  Her dyspnea is partially related to lung disease; based on last CT, she has developed mild pulmonary fibrosis since COVID, possibly post-inflammatory.  TEE (2/24) showed EF 60-65%, RV normal. RHC (3/24) with normal filling pressures and moderate pulmonary hypertension in setting of ASD, ASD has now been closed. NYHA class II, mild volume overload on exam and by Cardiomems with weight up 4 lbs.  - Increase Lasix to 100 mg bid and KCl to 60 daily.  BMET/BNP today, BMET in 10 days.   - Continue Coreg 12.5 mg bid. - She did not tolerate SGLT2-inhibitor due to recurrent yeast infections.    2. OHS/OSA: She is using CPAP but not oxygen during the day (no longer appears to need). CT chest in 10/21 with emphysema and mild fibrosis, possibly post-inflammatory after COVID-19 infection.   - Followup with pulmonary (Dr. Isaiah Serge).  3. Pulmonary hypertension: RHC in 10/20 showed mild-moderate pulmonary venous hypertension.  She was later found to have a secundum ASD which has now been closed.  Not candidate for selective pulmonary vasodilators.  4. HTN: BP controlled.  5. Obesity: Body mass index is 34.96 kg/m. - She is on semaglutide. 6. GI bleeding: No further overt bleeding.  Has had IV Fe.  7. Atypical atrial flutter/atrial fibrillation: Atypical flutter at follow up 2/24.  HR controlled on Coreg. S/p TEE/DCCV to NSR. Back in AFL 7/24. Amiodarone started and underwent DCCV 8/24 to NSR.  She is in NSR today.  - Continue amiodarone 100 mg daily. Check LFTs/TSH today. She will need a regular eye exam.  - Continue Coreg.  - Continue apixaban.  - Watchman would be a reasonable consideration in the future, especially if she bleeds again.   8. ASD: small 1.9 x 1.2 cm  relatively high secundum ASD with left to right flow seen on TEE. RHC (3/24) showed hemodynamically significant ASD with Qp/Qs 1.5/1. s/p ASD closure 7/24.  Followup echo in 7/24 showed well-seated closure device.   Followup 6 wks with APP  Marca Ancona  06/10/2023

## 2023-06-10 NOTE — Patient Instructions (Signed)
INCREASE Lasix to 100 mg ( 5 Tabs) Twice daily  INCREASE 60 mEq ( 3 tabs) daily.  Labs done today, your results will be available in MyChart, we will contact you for abnormal readings.  Repeat blood work in 10 days.  Your physician recommends that you schedule a follow-up appointment in: 6 weeks.  If you have any questions or concerns before your next appointment please send [place] a message through [place] or call our office at [phone].    TO LEAVE A MESSAGE FOR THE NURSE SELECT OPTION 2, PLEASE LEAVE A MESSAGE INCLUDING: YOUR NAME DATE OF BIRTH CALL BACK NUMBER REASON FOR CALL**this is important as we prioritize the call backs  YOU WILL RECEIVE A CALL BACK THE SAME DAY AS LONG AS YOU CALL BEFORE 4:00 PM  At the [facility], you and your health needs are our priority. As part of our continuing mission to provide you with exceptional heart care, we have created designated Provider Care Teams. These Care Teams include your primary Cardiologist (physician) and Advanced Practice Providers (APPs- Physician Assistants and Nurse Practitioners) who all work together to provide you with the care you need, when you need it.   You may see any of the following providers on your designated Care Team at your next follow up: Dr Arvilla Meres Dr Marca Ancona Dr. Dorthula Nettles Dr. Clearnce Hasten Amy Filbert Schilder, NP Robbie Lis, [place] [facility] [place], [place] Brynda Peon, NP [place] Lee, NP Karle Plumber, PharmD   Please be sure to bring in all your medications bottles to every appointment.    Thank you for choosing [facility]

## 2023-06-11 LAB — T3, FREE: T3, Free: 2.1 pg/mL (ref 2.0–4.4)

## 2023-06-11 NOTE — Telephone Encounter (Signed)
Patient needs a longer hose for her cpap machine so that the pressure can be adjusted.

## 2023-06-14 NOTE — Telephone Encounter (Signed)
Called the pt and there was no answer- LMTCB    

## 2023-06-20 ENCOUNTER — Ambulatory Visit (HOSPITAL_COMMUNITY)
Admission: RE | Admit: 2023-06-20 | Discharge: 2023-06-20 | Disposition: A | Discharge status: Home / Self Care | Payer: Medicare HMO | Source: Ambulatory Visit | Attending: Cardiology | Admitting: Cardiology

## 2023-06-20 DIAGNOSIS — I5032 Chronic diastolic (congestive) heart failure: Secondary | ICD-10-CM

## 2023-06-20 LAB — BASIC METABOLIC PANEL
Anion gap: 9 (ref 5–15)
BUN: 13 mg/dL (ref 8–23)
CO2: 28 mmol/L (ref 22–32)
Calcium: 8.8 mg/dL — ABNORMAL LOW (ref 8.9–10.3)
Chloride: 99 mmol/L (ref 98–111)
Creatinine, Ser: 0.91 mg/dL (ref 0.44–1.00)
GFR, Estimated: >60 mL/min (ref >60)
Glucose, Bld: 92 mg/dL (ref 70–99)
Potassium: 3.8 mmol/L (ref 3.5–5.1)
Sodium: 136 mmol/L (ref 135–145)

## 2023-06-24 ENCOUNTER — Other Ambulatory Visit (HOSPITAL_COMMUNITY): Payer: Self-pay | Admitting: Cardiology

## 2023-07-01 ENCOUNTER — Telehealth (HOSPITAL_COMMUNITY): Payer: Self-pay

## 2023-07-01 IMAGING — US US RENAL
1 series · 14 of 25 positions shown · non-contrast
Comparison: CT abdomen and pelvis 06/03/2021.

CLINICAL DATA: Right renal cyst.

EXAM:
RENAL / URINARY TRACT ULTRASOUND COMPLETE

[Series 1: us renal · 0.21mm/px · 14 of 36 slices shown]
[im 1/36]
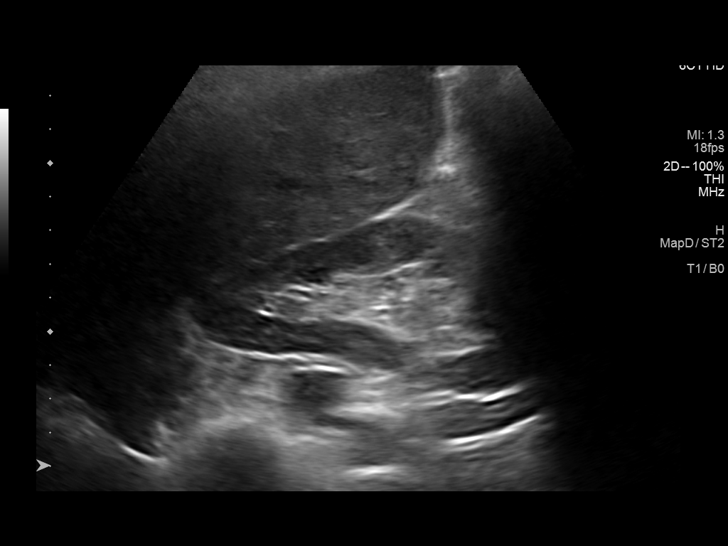
[im 3/36]
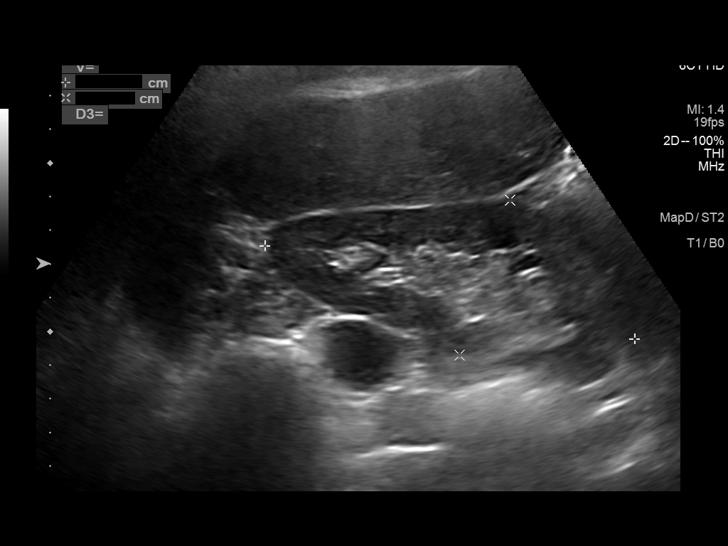
[im 6/36]
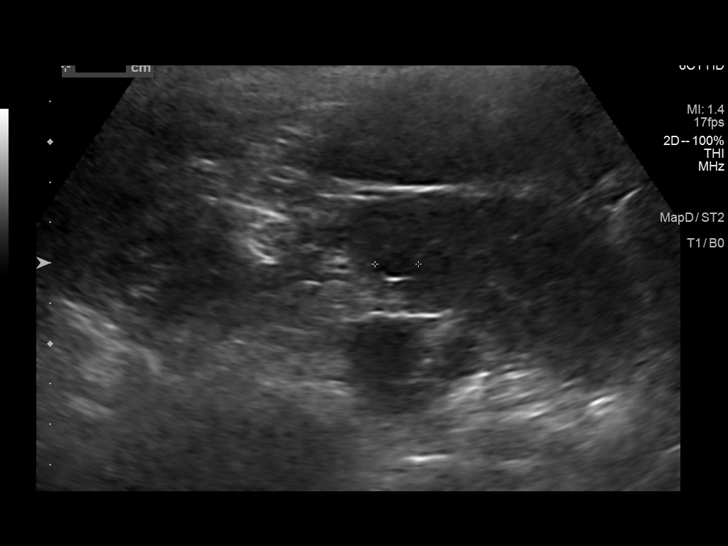
[im 9/36]
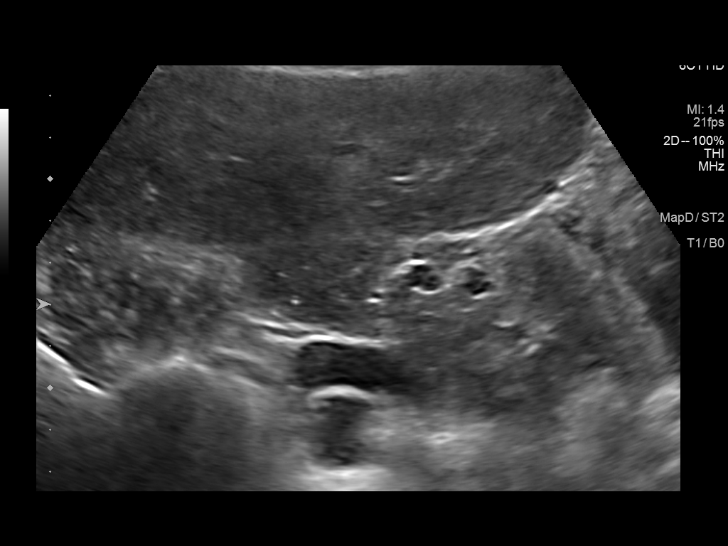
[im 12/36]
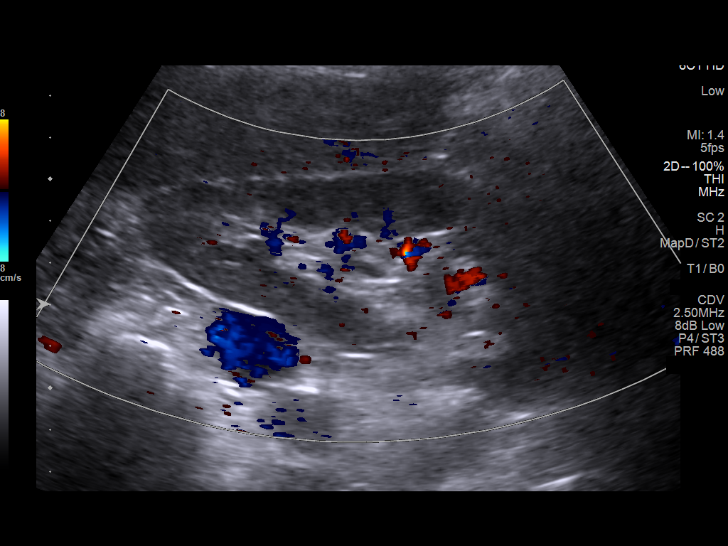
[im 14/36]
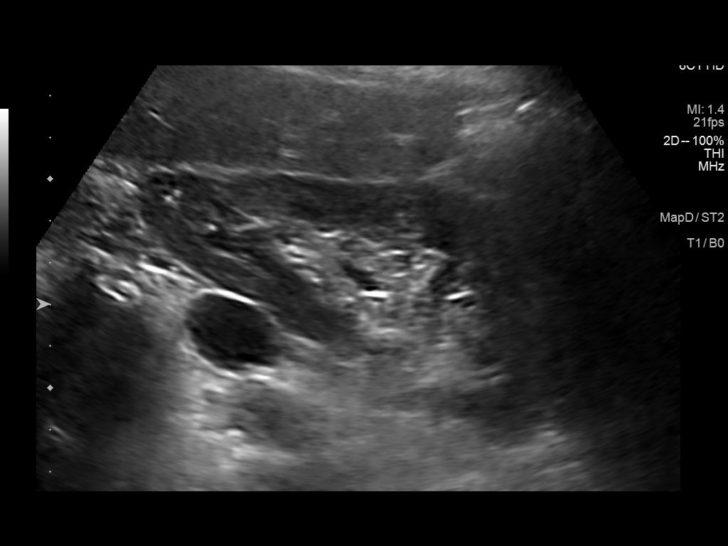
[im 17/36]
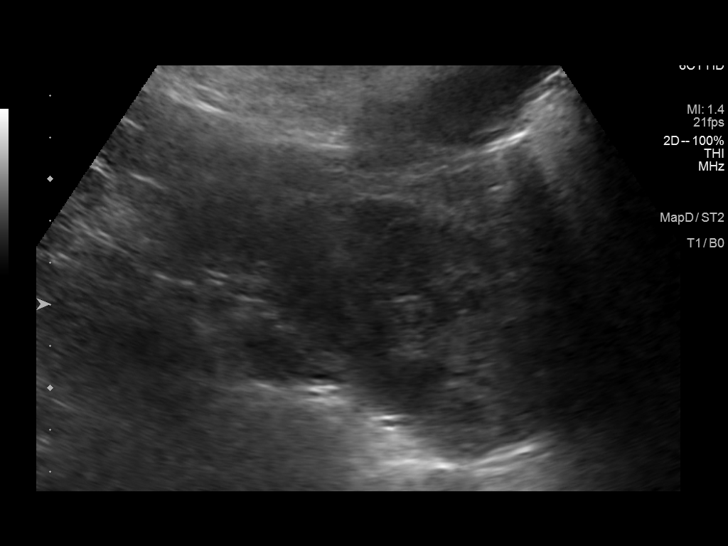
[im 19/36]
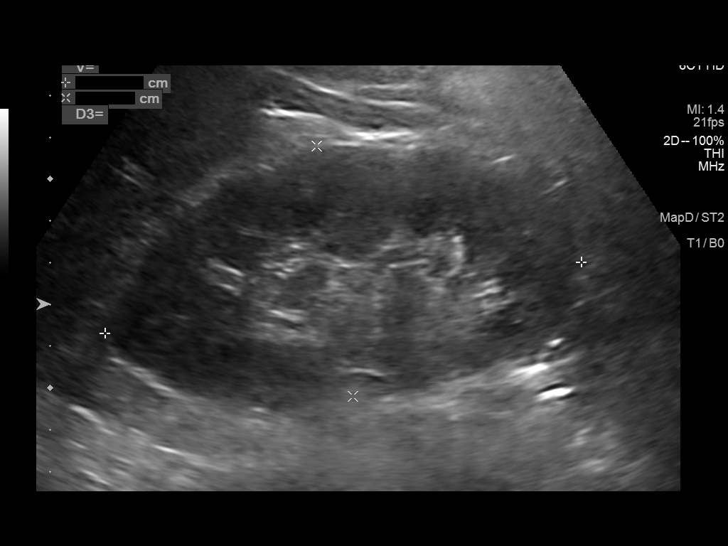
[im 22/36]
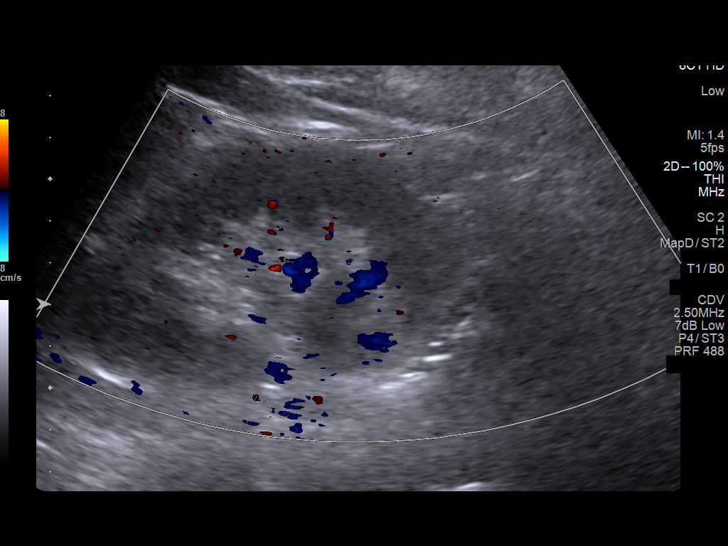
[im 24/36]
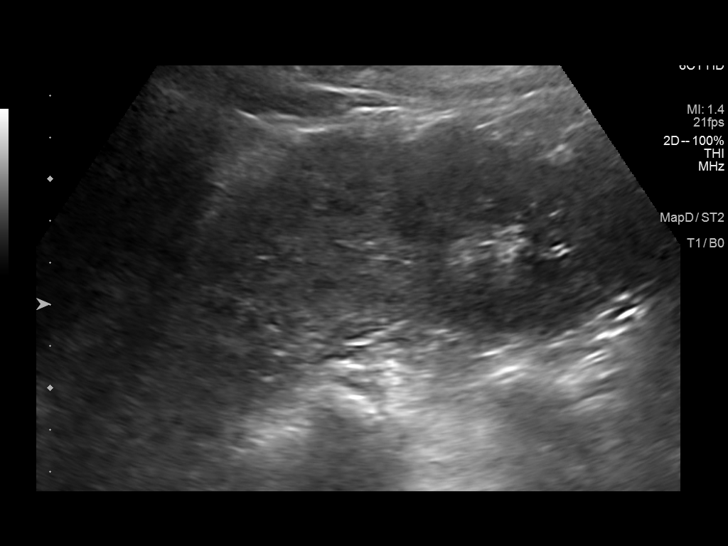
[im 27/36]
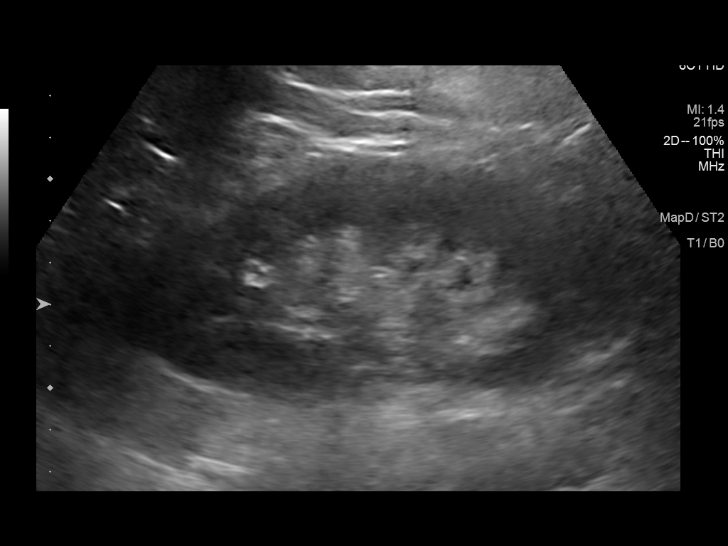
[im 30/36]
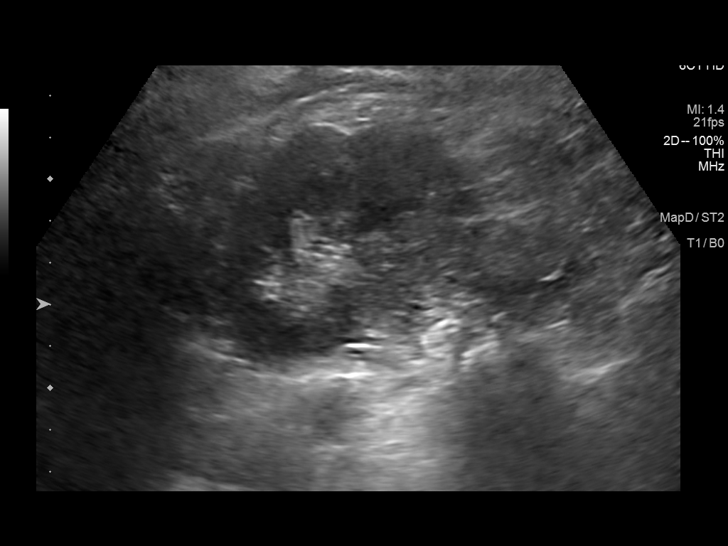
[im 33/36]
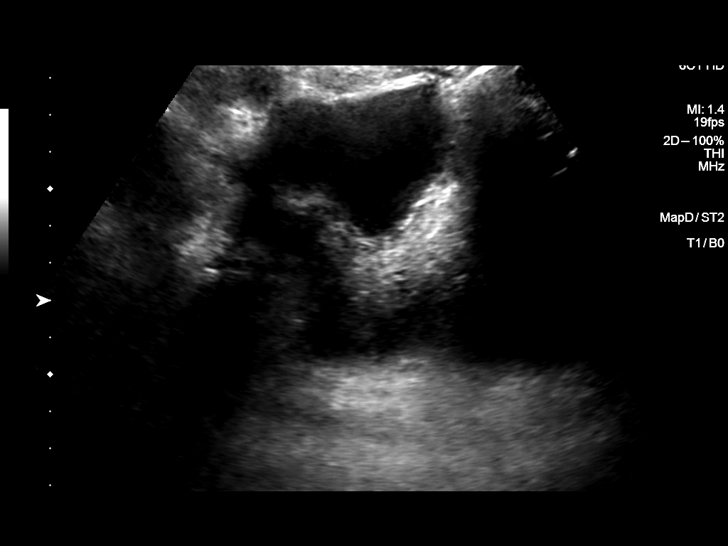
[im 36/36]
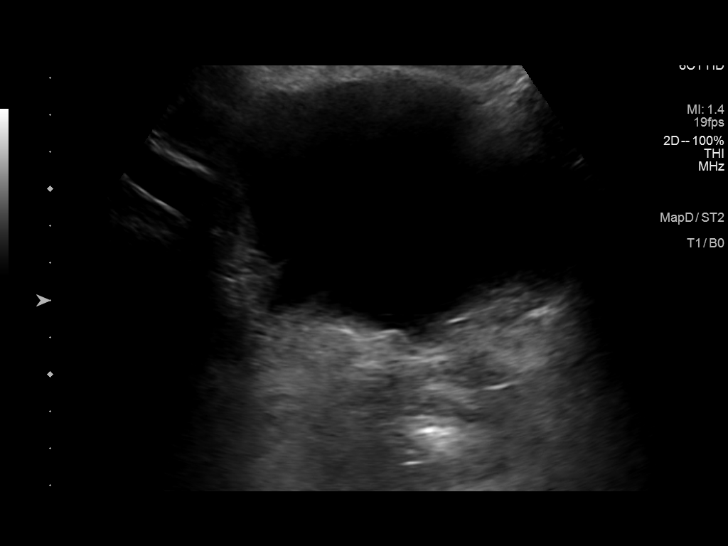

[14 of 25 positions shown; findings below may reference images not displayed]

FINDINGS: Right Kidney:

Renal measurements: 11.3 x 4.8 x 5.1 cm = volume: 145 mL.
Echogenicity within normal limits. No hydronephrosis visualized.
There is an 11 x 9 x 4 mm cyst in the superior pole of the right
kidney location in size do not correspond to CT abnormality in
question.

Left Kidney:

Renal measurements: 11.5 x 6.1 x 5.1 cm = volume: 187 mL.
Echogenicity within normal limits. No mass or hydronephrosis
visualized.

Bladder:

Appears normal for degree of bladder distention.

Other:

None.
IMPRESSION: 1. 11 mm right superior pole cyst. This cyst does not correspond to
size or location of CT lesion in question. This is indeterminate
lesion can be further characterized with MRI.

## 2023-07-01 NOTE — Telephone Encounter (Signed)
  [facility]   Pre-operative Risk Assessment    Request for Surgical Clearance{  Procedure:  TREATMENT MAY INCLUDE: ROOT CANAL, CLEANING (SIMPLE OR DEEP), RADIOGRAPHS, FILLINGS, CROWNS, BRIDGES, EXTRACTIONS (SIMPLE OR SURGICAL), AND ANTIBIOTICS   Date of Surgery:  Clearance TBD                             {  Surgeon:  [facility] [place] (NO SURGEON/DDS LISTED) Surgeon's Group or Practice Name:  [facility]  Phone number:  [phone] Fax number:  [phone] { Type of Clearance Requested:   - Pharmacy:  Hold Apixaban  (Eliquis )   { Type of Anesthesia:  Not Indicated- NOT LISTED    Additional requests/questions:  Please fax a copy of CLEARANCE/ELIQUIS  INSTRUCTIONS to the surgeon's office.  Signed, [name] [name]   07/01/2023, 4:58 PM

## 2023-07-02 NOTE — Telephone Encounter (Signed)
 Henry, [place], FNP  You1 hour ago (9:16 AM)    OK from cardiac standpoint for dental procedures, no need to hold Eliquis for tooth extraction.   Clearance/recommendations routed to requesting practice via Epic fax function

## 2023-07-10 ENCOUNTER — Other Ambulatory Visit (HOSPITAL_COMMUNITY): Payer: Medicare HMO

## 2023-07-10 ENCOUNTER — Ambulatory Visit: Payer: Medicare HMO

## 2023-07-10 DIAGNOSIS — M79671 Pain in right foot: Secondary | ICD-10-CM | POA: Diagnosis not present

## 2023-07-10 DIAGNOSIS — M19072 Primary osteoarthritis, left ankle and foot: Secondary | ICD-10-CM | POA: Diagnosis not present

## 2023-07-10 DIAGNOSIS — M79672 Pain in left foot: Secondary | ICD-10-CM | POA: Diagnosis not present

## 2023-07-10 DIAGNOSIS — E11621 Type 2 diabetes mellitus with foot ulcer: Secondary | ICD-10-CM | POA: Diagnosis not present

## 2023-07-18 ENCOUNTER — Other Ambulatory Visit: Payer: Self-pay | Admitting: Internal Medicine

## 2023-07-19 NOTE — Progress Notes (Signed)
PCP: [name], MD Cardiology: Dr. Herbie Baltimore HF Cardiology: Dr. Shirlee Latch  CC: HF follow up  78 y.o. with history of OHS/OSA and RV dysfunction was referred by Dr. Herbie Baltimore for evaluation of CHF.  She has a history of OSA/OHS.  She uses CPAP at night but not currently using oxygen during the day.  Remote smoker.  She had an echo in 9/20 showing EF 50-55% but moderate-severe RV dysfunction with mild-moderate RV dilation.  RHC/LHC in 10/20 showed normal coronaries, pulmonary venous hypertension.  V/Q scan in 2/21 did not show evidence for acute on chronic PE.    Cardiomems was placed in 5/21.  RHC at that time showed normal filling pressures and moderate pulmonary hypertension but suspect due to high output.  No evidence for shunt lesion.  Echo showed EF 60-65%, enlarged RV with normal systolic function.   She was admitted in 5/21 with syncope, thought to be due to a seizure.    She had COVID-19 infection in 8/21. 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   Echo in 9/21 with EF 50-55%, severe RV enlargement and severely decreased RV systolic function, PASP 54 mmHg.    She was admitted in 5/22 with CHF, bronchitis.  She was diuresed and treated with antibiotics. Echo in 5/22 showed EF 60-65%, mild LVH, ?normal RV, severe LAE, ?normal PA pressure, IVC normal.  V/Q scan negative.   In 10/22, she was admitted with GI bleeding, thought to be diverticular.  BP meds were decreased and Lasix was cut back.  With increased weight, she increased her Lasix back to 80 mg daily.   She was seen in the ER in 1/24 with atypical atrial flutter, this was new.  She was started on apixaban and sent home from ER. She was seen for follow up and planned to set her up for DCCV, but she was admitted on 07/25/22 with GI bleed, thought to be diverticular.  Apixaban was held and she was tranfused. Apixaban was restarted after  discharge.  She did not have endoscopy.   Follow up 08/01/22, she remained in atypical atrial flutter. Arranged for TEE/DCCV.  TEE/DCCV showed EF 60-65%, mild LVH, RV normal, small ASD to L->R flow, no LAA thrombus. S/p DCCV --> NSR.  RHC (3/24) showed normal filling pressures, moderate pulmonary hypertension in setting of ASD, Qp/Qs 1.5/1. She was referred to Structural Heart team for ASD repair consideration.  S/p ASD repair 7/24. Ltd echo 7/24 showed EF 55-60%, RV function moderately reduced.  Follow up 7/24, found to be back in AFL, rate controlled. Amiodarone started, and underwent DCCV 02/04/23 to NSR.  Ltd Echo with bubble study 8/24 showed EF 60-65%, G2DD, normal RV, PASP 44.5 mmhg, no evidence of shunting, IVC dilated.  Today she returns for HF follow up. Overall feeling fine. She has SOB walking further distances on flat ground. Able to do ADLs but feels off-balance, she walks with a cane and follows with ortho for foot OA. Feels occasional atypical chest pain and palpitations .Denies abnormal bleeding, edema, or PND/Orthopnea. Appetite ok. No fever or chills. Weight at home 171 pounds. Taking all medications. Complaint with CPAP.  ECG (personally reviewed): none ordered today.  Cardiomems (last reading reviewed on 07/19/23): PADP 15, goal 20  Labs (1/24): K 3.5, creatinine 0.92, BNP 167 Labs (2/24): transferrin saturation 7%, hgb 8.1 Labs (3/24): K 4.0, creatinine 0.99 Labs (7/24): K 3.5, creatinine 0.90 Labs (10/24): LFTs  normal, K 3.8, creatinine 0.97 Labs (11/24): TSH mildly elevated 4.7 Labs (12/24): hgb 9, K 3.8, creatinine 0.91, THS normal, LFTs normal  PMH: 1. OHS/OSA: She uses CPAP.  2. H/o CVA 3. Seizure disorder 4. Type 2 DM 5. Depression 6. HTN 7. Hyperlipidemia 8. Chronic diastolic CHF: Echo (9/20) with EF 50-55%, moderate to severe RV dilation with mild-moderately decreased RV systolic function.  - RHC/LHC (10/20): normal coronaries; mean RA 10, PA 56/14 mean  31, mean PCWP 13, LVEDP 18, CO/CI 7.35/3.73, PVR 2.44.  - RHC/Cardiomems placement (5/21): mean RA 7, PA 58/21 mean 34, PCWP mean 12, CI 8.3 F/3.95 T, PVR 2.86 WU. No evidence for shunt lesion.  - Echo (5/21): EF 60-65%, RV enlarged with normal systolic function.  - Echo (9/21): EF 50-55%, severe RV enlargement and severely decreased RV systolic function, PASP 54 mmHg.  - Echo (5/22): EF 60-65%, mild LVH, ?normal RV, severe LAE, ?normal PA pressure, IVC normal. - TEE (2/23): EF 60-65%, mild LVH, RV normal - RHC (3/24): RA mean 6, PA 57/12 (mean 31), PCWP mean 13, CO/CI (Fick) 17/9.4, Qp/Qs 1.5/1 - Ltd Echo with bubble study (8/24):EF 60-65%, G2DD, normal RV, PASP 44.5 mmhg, moderately decreased RV systolic function, no evidence of shunting, IVC dilated. 9. Primarily pulmonary venous hypertension.  - V/Q scan (2/21, 5/22): No evidence for acute or chronic PE.  - PFTs normal in 5/21.  10. COVID-19 PNA 8/21.  - CT chest in 10/21 with emphysema and mild fibrosis concerning for post-infectious inflammatory fibrosis from COVID.  11. GI bleeding: 10/22, diverticular bleed.  - Presumed diverticular bleed in 1/24.  12. Atypical atrial flutter/atrial fibrillation: First noted 1/24.  - TEE/DCCV (2/24) to NSR - DCCV (8/24) to NSR 13. ASD: RHC (3/24) showed Qp/Qs 1.5/1, suspect hemodynamically significant ASD. - s/p ASD closure (7/24) - Ltd echo w/ bubble study (8/24) showed no evidence of shunting, stable well-seated ASD closure device.  Social History   Socioeconomic History   Marital status: Divorced    Spouse name: Not on file   Number of children: Not on file   Years of education: Not on file   Highest education level: Not on file  Occupational History   Occupation: Disabled    Comment: CNA  Tobacco Use   Smoking status: Former    Current packs/day: 0.50    Average packs/day: 0.5 packs/day for 25.0 years (12.5 ttl pk-yrs)    Types: Cigarettes   Smokeless tobacco: Never   Tobacco  comments:    quit smoking 20+ytrs ago  Vaping Use   Vaping status: Never Used  Substance and Sexual Activity   Alcohol use: No   Drug use: No   Sexual activity: Not Currently    Birth control/protection: Surgical  Other Topics Concern   Not on file  Social History Narrative   She lives in [place]. She has lots of family in the area and is accompanied by her sister.   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   She has a distant history of smoking, quit 20 years ago.   Right handed    Social Drivers of Health   Financial Resource Strain: Low Risk  (07/18/2022)   Overall Financial Resource Strain (CARDIA)    Difficulty of Paying Living Expenses: Not hard at all  Food Insecurity: No Food Insecurity (07/18/2022)   Hunger Vital Sign    Worried About Running Out of Food in the Last Year: Never true  Ran Out of Food in the Last Year: Never true  Transportation Needs: Unmet Transportation Needs (11/28/2022)   PRAPARE - Transportation    Lack of Transportation (Medical): Yes    Lack of Transportation (Non-Medical): Yes  Physical Activity: Inactive (07/18/2022)   Exercise Vital Sign    Days of Exercise per Week: 0 days    Minutes of Exercise per Session: 0 min  Stress: No Stress Concern Present (07/18/2022)   [employer] of Occupational Health - Occupational Stress Questionnaire    Feeling of Stress : Not at all  Social Connections: Not on file  Intimate Partner Violence: Not At Risk (10/22/2018)   Humiliation, Afraid, Rape, and Kick questionnaire    Fear of Current or Ex-Partner: No    Emotionally Abused: No    Physically Abused: No    Sexually Abused: No   Family History  Problem Relation Age of Onset   Allergies Father    Heart disease Father        before 56   Heart failure Father    Hypertension Father    Hyperlipidemia Father    Heart disease Mother    Diabetes Mother    Hypertension Mother    Hyperlipidemia Mother     Hypertension Sister    Heart disease Sister        before 74   Hyperlipidemia Sister    Diabetes Brother    Hypertension Brother    Diabetes Sister    Hypertension Sister    Diabetes Son    Hypertension Son    Cancer Other    Current Outpatient Medications  Medication Sig Dispense Refill   acetaminophen (TYLENOL) 500 MG tablet Take 500-1,000 mg by mouth every 4 (four) hours as needed for moderate pain or mild pain.     albuterol (VENTOLIN HFA) 108 (90 Base) MCG/ACT inhaler Inhale 1-2 puffs into the lungs every 6 (six) hours as needed for wheezing or shortness of breath. 17 each 5   Alcaftadine (LASTACAFT) 0.25 % SOLN Place 1-2 drops into both eyes daily as needed (allergy eye/irritation.).     amiodarone (PACERONE) 100 MG tablet TAKE 1 TABLET EVERY DAY 90 tablet 3   Ascorbic Acid (VITAMIN C) 1000 MG tablet Take 1,000 mg by mouth in the morning.     atorvastatin (LIPITOR) 40 MG tablet Take 1 tablet (40 mg total) by mouth daily. 90 tablet 3   B-D ULTRAFINE III SHORT PEN 31G X 8 MM MISC USE DAILY WITH VICTOZA AND/OR NOVOLOG. 400 each 2   benzonatate (TESSALON) 100 MG capsule Take 1 capsule by mouth three times daily as needed for cough 30 capsule 0   Blood Glucose Monitoring Suppl (TRUE METRIX AIR GLUCOSE METER) w/Device KIT USE AS DIRECTED 1 kit 3   carvedilol (COREG) 12.5 MG tablet TAKE 1 TABLET TWICE DAILY 180 tablet 3   diclofenac sodium (VOLTAREN) 1 % GEL Apply 2 g topically 4 (four) times daily as needed (pain).     ELIQUIS 5 MG TABS tablet Take 1 tablet by mouth twice daily 180 tablet 0   FeFum-FePoly-FA-B Cmp-C-Biot (INTEGRA PLUS) CAPS Take 1 capsule by mouth every morning. 30 capsule 2   ferrous sulfate 324 MG TBEC Take 324 mg by mouth daily with breakfast.     furosemide (LASIX) 20 MG tablet Take 5 tablets (100 mg total) by mouth 2 (two) times daily. 720 tablet 3   Glucagon (GVOKE HYPOPEN 2-PACK) 0.5 MG/0.1ML SOAJ INJECT 0.5 MG (1 PEN) INTO THE SKIN DAILY  AS NEEDED. 2 mL 11    guaiFENesin-dextromethorphan (ROBITUSSIN DM) 100-10 MG/5ML syrup Take 10 mLs by mouth every 6 (six) hours as needed for cough. 118 mL 0   hydrALAZINE (APRESOLINE) 100 MG tablet Take 1 tablet (100 mg total) by mouth 3 (three) times daily. 270 tablet 3   hydrocortisone cream 1 % Apply 1 application topically daily as needed for itching.      Insulin Glargine (TOUJEO SOLOSTAR [place]) Inject 20 Units into the skin at bedtime.     insulin lispro (HUMALOG KWIKPEN) 100 UNIT/ML KwikPen Inject 0-10 Units into the skin See admin instructions. Sliding scale:Inject  (high blood sugar over 150). 150-199=2 units;200-249=4 units,250-299=6 units;300-349=8 units;350 above 10 units: Over 350 call physician 15 mL 0   Isopropyl Alcohol (ALCOHOL WIPES) 70 % MISC Apply 1 each topically 2 (two) times daily. 300 each 3   isosorbide mononitrate (IMDUR) 30 MG 24 hr tablet Take 1 tablet (30 mg total) by mouth daily. 90 tablet 3   lamoTRIgine (LAMICTAL) 200 MG tablet Take 1 tablet (200 mg total) by mouth 2 (two) times daily. 180 tablet 3   levETIRAcetam (KEPPRA) 500 MG tablet Take 2 tablets every night 180 tablet 3   loratadine (CLARITIN) 10 MG tablet Take 1 tablet (10 mg total) by mouth daily. 30 tablet 2   oxyCODONE (OXY IR/ROXICODONE) 5 MG immediate release tablet Take 5 mg by mouth every 6 (six) hours as needed (pain.).     OZEMPIC, 1 MG/DOSE, 4 MG/3ML SOPN INJECT 1MG  UNDER THE SKIN ONE TIME WEEKLY (Patient taking differently: INJECT 1MG  UNDER THE SKIN ONE TIME WEEKLY ON WEDNESDAYS) 9 mL 3   Pancrelipase, Lip-Prot-Amyl, 25000-79000 units CPEP Take 2 capsules by mouth in the morning, at noon, and at bedtime.     potassium chloride SA (KLOR-CON M) 20 MEQ tablet Take 3 tablets (60 mEq total) by mouth daily. 300 tablet 3   sennosides-docusate sodium (SENOKOT-S) 8.6-50 MG tablet Take 1-2 tablets by mouth at bedtime as needed for constipation.     sertraline (ZOLOFT) 50 MG tablet TAKE 1 TABLET EVERY DAY 90 tablet 3   tiZANidine  (ZANAFLEX) 2 MG tablet TAKE 1 TABLET AT BEDTIME AS NEEDED 30 tablet 11   tolnaftate (TINACTIN) 1 % cream Apply 1 application topically daily as needed (foot fungus).      TRUE METRIX BLOOD GLUCOSE TEST test strip USE AS DIRECTED TO CHECK BLOOD SUGARS 2 TIMES PER DAY 200 strip 3   TRUEplus Lancets 33G MISC TEST BLOOD SUGAR TWICE DAILY AS DIRECTED 200 each 3   No current facility-administered medications for this encounter.   Facility-Administered Medications Ordered in Other Encounters  Medication Dose Route Frequency Provider Last Rate Last Admin   0.9 %  sodium chloride infusion (Manually program via Guardrails IV Fluids)   Intravenous Once Anderson, Octave Montrose M, FNP       0.9 %  sodium chloride infusion (Manually program via Guardrails IV Fluids)   Intravenous Once [place], [place]       Wt Readings from Last 3 Encounters:  07/22/23 80 kg (176 lb 6.4 oz)  06/10/23 83.9 kg (185 lb)  06/03/23 83.6 kg (184 lb 6.4 oz)   BP (!) 142/70   Pulse 70   Wt 80 kg (176 lb 6.4 oz)   SpO2 98%   BMI 33.33 kg/m  Physical Exam General:  NAD. No resp difficulty, walked into clinic with cane HEENT: Normal Neck: Supple. No JVD. Carotids 2+ bilat; no bruits. No  lymphadenopathy or thryomegaly appreciated. Cor: PMI nondisplaced. Regular rate & rhythm. No rubs, gallops or murmurs. Lungs: Clear Abdomen: Soft, obese, nontender, nondistended. No hepatosplenomegaly. No bruits or masses. Good bowel sounds. Extremities: No cyanosis, clubbing, rash, edema Neuro: Alert & oriented x 3, cranial nerves grossly intact. Moves all 4 extremities w/o difficulty. Affect pleasant.  Assessment/Plan: 1. Chronic diastolic CHF/RV failure: Echo in 9/20 showed EF 50-55% with moderate-severe RV dilation and mild-moderately decreased systolic function. RHC/LHC showed mild-moderate pulmonary venous hypertension and no significant coronary disease.  Cause of RV dysfunction is not clear, possibly due to diastolic CHF + OHS/OSA.   V/Q scan in 2/21 did not show acute or chronic PE.  Cardiomems was placed in 5/21, normal filling pressures with moderate pulmonary hypertension likely due to high output (no evidence for shunt lesion on RHC).  Echo in 9/21 with EF 50-55%, severe RVE with severe RV dysfunction.  Echo in 5/22 with EF 60-65%, RV function reportedly normal with normal PA pressure.  Her dyspnea is partially related to lung disease; based on last CT, she has developed mild pulmonary fibrosis since COVID, possibly post-inflammatory.  TEE (2/24) showed EF 60-65%, RV normal. RHC (3/24) with normal filling pressures and moderate pulmonary hypertension in setting of ASD, ASD has now been closed. NYHA class II, functional class confounded by body habitus and OA. She is not volume overloaded on exam or Cardiomems. - Continue Lasix 100 mg bid + 60 KCl daily.  BMET/BNP today. - Continue Coreg 12.5 mg bid. - She did not tolerate SGLT2-inhibitor due to recurrent yeast infections.    2. OHS/OSA: She is using CPAP but not oxygen during the day (no longer appears to need). CT chest in 10/21 with emphysema and mild fibrosis, possibly post-inflammatory after COVID-19 infection.   - Followup with pulmonary (Dr. Isaiah Serge).  3. Pulmonary hypertension: RHC in 10/20 showed mild-moderate pulmonary venous hypertension.  She was later found to have a secundum ASD which has now been closed.  Not candidate for selective pulmonary vasodilators.  4. HTN: BP controlled.  5. Obesity: Body mass index is 33.33 kg/m. - She is on semaglutide. 6. GI bleeding: No further overt bleeding.  Has had IV Fe.  7. Atypical atrial flutter/atrial fibrillation: Atypical flutter at follow up 2/24.  HR controlled on Coreg. S/p TEE/DCCV to NSR. Back in AFL 7/24. Amiodarone started and underwent DCCV 8/24 to NSR.  She is regular on exam today. - Continue amiodarone 100 mg daily. Recent LFTs/TSH normal. She will need a regular eye exam.  - Continue Coreg.  - Continue  apixaban. No bleeding issues. - Watchman would be a reasonable consideration in the future, especially if she bleeds again.   8. ASD: small 1.9 x 1.2 cm relatively high secundum ASD with left to right flow seen on TEE. RHC (3/24) showed hemodynamically significant ASD with Qp/Qs 1.5/1. s/p ASD closure 7/24.  Followup echo in 7/24 showed well-seated closure device.   Follow up in 3-4 months with Dr. Kathreen Cornfield [facility] FNP-BC 07/22/2023

## 2023-07-22 ENCOUNTER — Telehealth (HOSPITAL_COMMUNITY): Payer: Self-pay

## 2023-07-22 ENCOUNTER — Ambulatory Visit (HOSPITAL_COMMUNITY)
Admission: RE | Admit: 2023-07-22 | Discharge: 2023-07-22 | Disposition: A | Discharge status: Home / Self Care | Payer: Medicare PPO | Source: Ambulatory Visit | Attending: Family Medicine

## 2023-07-22 ENCOUNTER — Encounter (HOSPITAL_COMMUNITY): Payer: Self-pay

## 2023-07-22 VITALS — BP 142/70 | HR 70 | Wt 176.4 lb

## 2023-07-22 DIAGNOSIS — I4891 Unspecified atrial fibrillation: Secondary | ICD-10-CM | POA: Diagnosis not present

## 2023-07-22 DIAGNOSIS — I5032 Chronic diastolic (congestive) heart failure: Secondary | ICD-10-CM | POA: Insufficient documentation

## 2023-07-22 DIAGNOSIS — K922 Gastrointestinal hemorrhage, unspecified: Secondary | ICD-10-CM | POA: Diagnosis not present

## 2023-07-22 DIAGNOSIS — Z79899 Other long term (current) drug therapy: Secondary | ICD-10-CM | POA: Insufficient documentation

## 2023-07-22 DIAGNOSIS — I110 Hypertensive heart disease with heart failure: Secondary | ICD-10-CM | POA: Insufficient documentation

## 2023-07-22 DIAGNOSIS — E669 Obesity, unspecified: Secondary | ICD-10-CM | POA: Diagnosis not present

## 2023-07-22 DIAGNOSIS — Q211 Atrial septal defect, unspecified: Secondary | ICD-10-CM | POA: Diagnosis not present

## 2023-07-22 DIAGNOSIS — I484 Atypical atrial flutter: Secondary | ICD-10-CM | POA: Diagnosis not present

## 2023-07-22 DIAGNOSIS — Z87891 Personal history of nicotine dependence: Secondary | ICD-10-CM | POA: Diagnosis not present

## 2023-07-22 DIAGNOSIS — Z6833 Body mass index (BMI) 33.0-33.9, adult: Secondary | ICD-10-CM | POA: Insufficient documentation

## 2023-07-22 DIAGNOSIS — Z8719 Personal history of other diseases of the digestive system: Secondary | ICD-10-CM | POA: Diagnosis not present

## 2023-07-22 DIAGNOSIS — Z95818 Presence of other cardiac implants and grafts: Secondary | ICD-10-CM | POA: Diagnosis not present

## 2023-07-22 DIAGNOSIS — E662 Morbid (severe) obesity with alveolar hypoventilation: Secondary | ICD-10-CM | POA: Insufficient documentation

## 2023-07-22 DIAGNOSIS — Z794 Long term (current) use of insulin: Secondary | ICD-10-CM | POA: Diagnosis not present

## 2023-07-22 DIAGNOSIS — G4733 Obstructive sleep apnea (adult) (pediatric): Secondary | ICD-10-CM

## 2023-07-22 DIAGNOSIS — I272 Pulmonary hypertension, unspecified: Secondary | ICD-10-CM | POA: Insufficient documentation

## 2023-07-22 LAB — BASIC METABOLIC PANEL
Anion gap: 14 (ref 5–15)
BUN: 17 mg/dL (ref 8–23)
CO2: 21 mmol/L — ABNORMAL LOW (ref 22–32)
Calcium: 9.4 mg/dL (ref 8.9–10.3)
Chloride: 102 mmol/L (ref 98–111)
Creatinine, Ser: 1.41 mg/dL — ABNORMAL HIGH (ref 0.44–1.00)
GFR, Estimated: 39 mL/min — ABNORMAL LOW (ref >60)
Glucose, Bld: 175 mg/dL — ABNORMAL HIGH (ref 70–99)
Potassium: 3.4 mmol/L — ABNORMAL LOW (ref 3.5–5.1)
Sodium: 137 mmol/L (ref 135–145)

## 2023-07-22 LAB — BRAIN NATRIURETIC PEPTIDE: B Natriuretic Peptide: 30.3 pg/mL (ref 0.0–100.0)

## 2023-07-22 MED ORDER — APIXABAN 5 MG PO TABS
5.0000 mg | ORAL_TABLET | Freq: Two times a day (BID) | ORAL | 3 refills | Status: AC
Start: 2023-07-22 — End: ?

## 2023-07-22 MED ORDER — FUROSEMIDE 20 MG PO TABS: ORAL_TABLET | ORAL | 3 refills | Status: AC

## 2023-07-22 NOTE — Patient Instructions (Addendum)
Thank you for coming in today  If you had labs drawn today, any labs that are abnormal the clinic will call you No news is good news  Medications: Eliquis was [facility]  Follow up appointments:  Your physician recommends that you schedule a follow-up appointment in:  3-4 months With Dr. Earlean Shawl will receive a reminder letter in the mail a few months in advance. If you don't receive a letter, please call our office to schedule the follow-up appointment.    Do the following things EVERYDAY: Weigh yourself in the morning before breakfast. Write it down and keep it in a log. Take your medicines as prescribed Eat low salt foods--Limit salt (sodium) to 2000 mg per day.  Stay as active as you can everyday Limit all fluids for the day to less than 2 liters   At the [facility], you and your health needs are our priority. As part of our continuing mission to provide you with exceptional heart care, we have created designated Provider Care Teams. These Care Teams include your primary Cardiologist (physician) and Advanced Practice Providers (APPs- Physician Assistants and Nurse Practitioners) who all work together to provide you with the care you need, when you need it.   You may see any of the following providers on your designated Care Team at your next follow up: Dr Arvilla Meres Dr Marca Ancona Dr. Marcos Eke, NP Robbie Lis, [place] [facility] [place], [place] Brynda Peon, NP Karle Plumber, PharmD   Please be sure to bring in all your medications bottles to every appointment.    Thank you for choosing [facility]  If you have any questions or concerns before your next appointment please send [place] a message through [place] or call our office at [phone].    TO LEAVE A MESSAGE FOR THE NURSE SELECT OPTION 2, PLEASE LEAVE A MESSAGE INCLUDING: YOUR  NAME DATE OF BIRTH CALL BACK NUMBER REASON FOR CALL**this is important as we prioritize the call backs  YOU WILL RECEIVE A CALL BACK THE SAME DAY AS LONG AS YOU CALL BEFORE 4:00 PM

## 2023-07-22 NOTE — Telephone Encounter (Signed)
-----   Message from [place] sent at 07/22/2023  1:29 PM EST ----- K is low and renal function up a bit. With low cardiomems reading, concern volume is a bit low.  Decrease Lasix to 80 mg bid, Repeat BMET in 10-14 days. Continue to use Cardiomems daily.

## 2023-07-22 NOTE — Telephone Encounter (Signed)
Patient's lasix medication has been changed and updated in her chart. Pt's labs placed and appointment scheduled. In addition, pt's lasix medication has been sent to her pharmacy. Pt aware, agreeable, and verbalized understanding.

## 2023-07-26 DIAGNOSIS — G4733 Obstructive sleep apnea (adult) (pediatric): Secondary | ICD-10-CM | POA: Diagnosis not present

## 2023-08-01 ENCOUNTER — Ambulatory Visit (HOSPITAL_COMMUNITY)
Admission: RE | Admit: 2023-08-01 | Discharge: 2023-08-01 | Disposition: A | Discharge status: Home / Self Care | Payer: Medicare PPO | Source: Ambulatory Visit | Attending: Internal Medicine | Admitting: Internal Medicine

## 2023-08-01 DIAGNOSIS — I5032 Chronic diastolic (congestive) heart failure: Secondary | ICD-10-CM | POA: Insufficient documentation

## 2023-08-01 LAB — BASIC METABOLIC PANEL
Anion gap: 13 (ref 5–15)
BUN: 19 mg/dL (ref 8–23)
CO2: 27 mmol/L (ref 22–32)
Calcium: 9.3 mg/dL (ref 8.9–10.3)
Chloride: 97 mmol/L — ABNORMAL LOW (ref 98–111)
Creatinine, Ser: 1.06 mg/dL — ABNORMAL HIGH (ref 0.44–1.00)
GFR, Estimated: 55 mL/min — ABNORMAL LOW (ref >60)
Glucose, Bld: 144 mg/dL — ABNORMAL HIGH (ref 70–99)
Potassium: 3.6 mmol/L (ref 3.5–5.1)
Sodium: 137 mmol/L (ref 135–145)

## 2023-08-04 ENCOUNTER — Other Ambulatory Visit: Payer: Self-pay | Admitting: Cardiovascular Disease

## 2023-08-05 DIAGNOSIS — M67962 Unspecified disorder of synovium and tendon, left lower leg: Secondary | ICD-10-CM | POA: Diagnosis not present

## 2023-08-05 DIAGNOSIS — E11621 Type 2 diabetes mellitus with foot ulcer: Secondary | ICD-10-CM | POA: Diagnosis not present

## 2023-08-07 ENCOUNTER — Ambulatory Visit: Payer: Medicare PPO | Admitting: Internal Medicine

## 2023-08-07 NOTE — Progress Notes (Deleted)
 I,[name] T [name], CMA,acting as a Neurosurgeon for [name] Aliment, MD.,have documented all relevant documentation on the behalf of [name] Aliment, MD,as directed by  [name] Aliment, MD while in the presence of [name] Aliment, MD.  Subjective:  Patient ID: [name] , female    DOB: [birth year] , 78 y.o.   MRN: [id number]  No chief complaint on file.   HPI  She is here today for a diabetes and BP check. She is accompanied by her sister [name] today. She reports compliance with meds. Denies headache, chest pain & sob.   Hypertension This is a chronic problem. The current episode started more than 1 year ago. The problem has been gradually improving since onset. The problem is controlled. Pertinent negatives include no blurred vision, chest pain or palpitations. The current treatment provides moderate improvement. Hypertensive end-organ damage includes kidney disease and heart failure. Identifiable causes of hypertension include sleep apnea.  Diabetes She presents for her follow-up diabetic visit. She has type 2 diabetes mellitus. Her disease course has been improving. There are no hypoglycemic associated symptoms. Pertinent negatives for diabetes include no blurred vision, no chest pain, no polydipsia, no polyphagia and no polyuria. There are no hypoglycemic complications. Diabetic complications include nephropathy. Risk factors for coronary artery disease include diabetes mellitus, dyslipidemia, hypertension, obesity, post-menopausal and sedentary lifestyle. She is compliant with treatment some of the time. Her weight is fluctuating minimally. She is following a generally healthy diet. She never participates in exercise. Her home blood glucose trend is decreasing steadily. Her breakfast blood glucose is taken between 8-9 am. Her breakfast blood glucose range is generally 130-140 mg/dl. An ACE inhibitor/angiotensin II receptor blocker is being taken. Eye exam is not current.     Past Medical  History:  Diagnosis Date   Abnormal liver function     in the past.   Anemia    Arthritis    all over   Bruises easily    Cataract    right eye;immature   CHF (congestive heart failure) (HCC)    Chronic back pain    stenosis   Chronic cough    Chronic kidney disease    COPD (chronic obstructive pulmonary disease) (HCC)    COVID    aug/sept 2021   Demand myocardial infarction [facility]) 2012   Demand Infarction in setting of Pancreatitis --> mild Troponin elevation, NON-OBSTRUCTIVE CAD   Depression    takes Abilify daily as well as Zoloft   Diastolic heart failure 2010   Grade 1 diastolic Dysfunction by Echo    Diverticulosis    DM (diabetes mellitus) (HCC)    takes Victoza daily as well as Lantus and Humalog   Empty sella (HCC)    on MRI in 2009.   Glaucoma    Headache(784.0)    last migraine-4-66yrs ago   History of blood transfusion    no abnormal reaction noted   History of colon polyps    benign   HTN (hypertension)    takes Benicar,Imdur,and Bystolic daily   Hyperlipidemia    takes Lipitor daily   Joint swelling    Nocturia    Obesity hypoventilation syndrome (HCC)    Obstructive sleep apnea 02/2018   Notably improved split-night study with weight loss from 270 (2017) down to 250 pounds (2019).   Pancreatitis    takes Pancrelipase daily   Parkinson's disease (HCC)    takes Sinemet daily   Peripheral neuropathy    Pneumonia 2012  Seizures (HCC)    takes Lamictal daily and Primidone nightly;last seizure 2wks ago   Urinary urgency    With increased frequency   Varicose veins of both lower extremities with pain    With edema.  Takes daily Lasix     Family History  Problem Relation Age of Onset   Allergies Father    Heart disease Father        before 59   Heart failure Father    Hypertension Father    Hyperlipidemia Father    Heart disease Mother    Diabetes Mother    Hypertension Mother    Hyperlipidemia Mother    Hypertension Sister    Heart  disease Sister        before 70   Hyperlipidemia Sister    Diabetes Brother    Hypertension Brother    Diabetes Sister    Hypertension Sister    Diabetes Son    Hypertension Son    Cancer Other      Current Outpatient Medications:    acetaminophen (TYLENOL) 500 MG tablet, Take 500-1,000 mg by mouth every 4 (four) hours as needed for moderate pain or mild pain., Disp: , Rfl:    albuterol (VENTOLIN HFA) 108 (90 Base) MCG/ACT inhaler, Inhale 1-2 puffs into the lungs every 6 (six) hours as needed for wheezing or shortness of breath., Disp: 17 each, Rfl: 5   Alcaftadine (LASTACAFT) 0.25 % SOLN, Place 1-2 drops into both eyes daily as needed (allergy eye/irritation.)., Disp: , Rfl:    amiodarone (PACERONE) 100 MG tablet, TAKE 1 TABLET EVERY DAY, Disp: 90 tablet, Rfl: 3   apixaban (ELIQUIS) 5 MG TABS tablet, Take 1 tablet (5 mg total) by mouth 2 (two) times daily., Disp: 180 tablet, Rfl: 3   Ascorbic Acid (VITAMIN C) 1000 MG tablet, Take 1,000 mg by mouth in the morning., Disp: , Rfl:    atorvastatin (LIPITOR) 40 MG tablet, Take 1 tablet (40 mg total) by mouth daily., Disp: 90 tablet, Rfl: 3   B-D ULTRAFINE III SHORT PEN 31G X 8 MM MISC, USE DAILY WITH VICTOZA AND/OR NOVOLOG., Disp: 400 each, Rfl: 2   benzonatate (TESSALON) 100 MG capsule, Take 1 capsule by mouth three times daily as needed for cough, Disp: 30 capsule, Rfl: 0   Blood Glucose Monitoring Suppl (TRUE METRIX AIR GLUCOSE METER) w/Device KIT, USE AS DIRECTED, Disp: 1 kit, Rfl: 3   carvedilol (COREG) 12.5 MG tablet, TAKE 1 TABLET TWICE DAILY, Disp: 180 tablet, Rfl: 3   diclofenac sodium (VOLTAREN) 1 % GEL, Apply 2 g topically 4 (four) times daily as needed (pain)., Disp: , Rfl:    FeFum-FePoly-FA-B Cmp-C-Biot (INTEGRA PLUS) CAPS, Take 1 capsule by mouth every morning., Disp: 30 capsule, Rfl: 2   ferrous sulfate 324 MG TBEC, Take 324 mg by mouth daily with breakfast., Disp: , Rfl:    furosemide (LASIX) 20 MG tablet, Take 4 tablets (80  mg total) by mouth twice a day., Disp: 720 tablet, Rfl: 3   Glucagon (GVOKE HYPOPEN 2-PACK) 0.5 MG/0.1ML SOAJ, INJECT 0.5 MG (1 PEN) INTO THE SKIN DAILY AS NEEDED., Disp: 2 mL, Rfl: 11   guaiFENesin-dextromethorphan (ROBITUSSIN DM) 100-10 MG/5ML syrup, Take 10 mLs by mouth every 6 (six) hours as needed for cough., Disp: 118 mL, Rfl: 0   hydrALAZINE (APRESOLINE) 100 MG tablet, Take 1 tablet (100 mg total) by mouth 3 (three) times daily., Disp: 270 tablet, Rfl: 3   hydrocortisone cream 1 %, Apply 1 application  topically daily as needed for itching. , Disp: , Rfl:    Insulin Glargine (TOUJEO SOLOSTAR [place]), Inject 20 Units into the skin at bedtime., Disp: , Rfl:    insulin lispro (HUMALOG KWIKPEN) 100 UNIT/ML KwikPen, Inject 0-10 Units into the skin See admin instructions. Sliding scale:Inject  (high blood sugar over 150). 150-199=2 units;200-249=4 units,250-299=6 units;300-349=8 units;350 above 10 units: Over 350 call physician, Disp: 15 mL, Rfl: 0   Isopropyl Alcohol (ALCOHOL WIPES) 70 % MISC, Apply 1 each topically 2 (two) times daily., Disp: 300 each, Rfl: 3   isosorbide mononitrate (IMDUR) 30 MG 24 hr tablet, TAKE 1 TABLET EVERY DAY, Disp: 90 tablet, Rfl: 2   lamoTRIgine (LAMICTAL) 200 MG tablet, Take 1 tablet (200 mg total) by mouth 2 (two) times daily., Disp: 180 tablet, Rfl: 3   levETIRAcetam (KEPPRA) 500 MG tablet, Take 2 tablets every night, Disp: 180 tablet, Rfl: 3   loratadine (CLARITIN) 10 MG tablet, Take 1 tablet (10 mg total) by mouth daily., Disp: 30 tablet, Rfl: 2   oxyCODONE (OXY IR/ROXICODONE) 5 MG immediate release tablet, Take 5 mg by mouth every 6 (six) hours as needed (pain.)., Disp: , Rfl:    OZEMPIC, 1 MG/DOSE, 4 MG/3ML SOPN, INJECT 1MG  UNDER THE SKIN ONE TIME WEEKLY ON WEDNESDAYS, Disp: 3 mL, Rfl: 3   Pancrelipase, Lip-Prot-Amyl, 25000-79000 units CPEP, Take 2 capsules by mouth in the morning, at noon, and at bedtime., Disp: , Rfl:    potassium chloride SA (KLOR-CON M) 20 MEQ  tablet, Take 3 tablets (60 mEq total) by mouth daily., Disp: 300 tablet, Rfl: 3   sennosides-docusate sodium (SENOKOT-S) 8.6-50 MG tablet, Take 1-2 tablets by mouth at bedtime as needed for constipation., Disp: , Rfl:    sertraline (ZOLOFT) 50 MG tablet, TAKE 1 TABLET EVERY DAY, Disp: 90 tablet, Rfl: 3   tiZANidine (ZANAFLEX) 2 MG tablet, TAKE 1 TABLET AT BEDTIME AS NEEDED, Disp: 30 tablet, Rfl: 11   tolnaftate (TINACTIN) 1 % cream, Apply 1 application topically daily as needed (foot fungus). , Disp: , Rfl:    TRUE METRIX BLOOD GLUCOSE TEST test strip, USE AS DIRECTED TO CHECK BLOOD SUGARS 2 TIMES PER DAY, Disp: 200 strip, Rfl: 3   TRUEplus Lancets 33G MISC, TEST BLOOD SUGAR TWICE DAILY AS DIRECTED, Disp: 200 each, Rfl: 3 No current facility-administered medications for this visit.  Facility-Administered Medications Ordered in Other Visits:    0.9 %  sodium chloride infusion (Manually program via Guardrails IV Fluids), , Intravenous, Once, [place], Jessica M, FNP   0.9 %  sodium chloride infusion (Manually program via Guardrails IV Fluids), , Intravenous, Once, McNeil, Jessica M, FNP   Allergies  Allergen Reactions   Other Anaphylaxis and Rash    Bleach Cigarette smoke/ Cough   Penicillins Hives    Did it involve swelling of the face/tongue/throat, SOB, or low BP? No Did it involve sudden or severe rash/hives, skin peeling, or any reaction on the inside of your mouth or nose? Yes Did you need to seek medical attention at a hospital or doctor's office? Yes When did it last happen?      Childhood allergy  If all above answers are "NO", may proceed with cephalosporin use.     Sulfa Antibiotics Hives   Aspirin Other (See Comments)     On aspirin 81 mg - Rectal bleeding in dec 2018   Codeine     Headache and makes the patient feel "off"     Review of Systems  Constitutional: Negative.   Eyes:  Negative for blurred vision.  Respiratory: Negative.    Cardiovascular: Negative.  Negative  for chest pain and palpitations.  Endocrine: Negative for polydipsia, polyphagia and polyuria.  Neurological: Negative.   Psychiatric/Behavioral: Negative.       There were no vitals filed for this visit. There is no height or weight on file to calculate BMI.  Wt Readings from Last 3 Encounters:  07/22/23 176 lb 6.4 oz (80 kg)  06/10/23 185 lb (83.9 kg)  06/03/23 184 lb 6.4 oz (83.6 kg)     Objective:  Physical Exam      Assessment And Plan:  Type 2 diabetes mellitus with stage 2 chronic kidney disease, with long-term current use of insulin (HCC)  Hypertensive heart and renal disease with renal failure, stage 1 through stage 4 or unspecified chronic kidney disease, with heart failure (HCC)  Acquired thrombophilia (HCC)  Chronic diastolic CHF (congestive heart failure), NYHA class 2 (HCC)  Recurrent depression (HCC)     No follow-ups on file.  Patient was given opportunity to ask questions. Patient verbalized understanding of the plan and was able to repeat key elements of the plan. All questions were answered to their satisfaction.  [name] Aliment, MD  I, [name] Aliment, MD, have reviewed all documentation for this visit. The documentation on 08/07/23 for the exam, diagnosis, procedures, and orders are all accurate and complete.   IF YOU HAVE BEEN REFERRED TO A SPECIALIST, IT MAY TAKE 1-2 WEEKS TO SCHEDULE/PROCESS THE REFERRAL. IF YOU HAVE NOT HEARD FROM US/SPECIALIST IN TWO WEEKS, PLEASE GIVE [place] A CALL AT [phone].   THE PATIENT IS ENCOURAGED TO PRACTICE SOCIAL DISTANCING DUE TO THE COVID-19 PANDEMIC.

## 2023-08-07 NOTE — Patient Instructions (Incomplete)

## 2023-08-11 ENCOUNTER — Other Ambulatory Visit: Payer: Self-pay | Admitting: Internal Medicine

## 2023-08-23 DIAGNOSIS — G4733 Obstructive sleep apnea (adult) (pediatric): Secondary | ICD-10-CM | POA: Diagnosis not present

## 2023-08-29 ENCOUNTER — Encounter: Payer: Self-pay | Admitting: Internal Medicine

## 2023-08-29 ENCOUNTER — Ambulatory Visit: Payer: Self-pay | Admitting: Internal Medicine

## 2023-08-29 VITALS — BP 104/60 | HR 62 | Temp 98.0°F | Ht 61.0 in | Wt 183.4 lb

## 2023-08-29 DIAGNOSIS — N182 Chronic kidney disease, stage 2 (mild): Secondary | ICD-10-CM | POA: Diagnosis not present

## 2023-08-29 DIAGNOSIS — M069 Rheumatoid arthritis, unspecified: Secondary | ICD-10-CM | POA: Diagnosis not present

## 2023-08-29 DIAGNOSIS — E1122 Type 2 diabetes mellitus with diabetic chronic kidney disease: Secondary | ICD-10-CM | POA: Diagnosis not present

## 2023-08-29 DIAGNOSIS — Z794 Long term (current) use of insulin: Secondary | ICD-10-CM

## 2023-08-29 DIAGNOSIS — F339 Major depressive disorder, recurrent, unspecified: Secondary | ICD-10-CM | POA: Diagnosis not present

## 2023-08-29 DIAGNOSIS — D6869 Other thrombophilia: Secondary | ICD-10-CM

## 2023-08-29 DIAGNOSIS — Z6834 Body mass index (BMI) 34.0-34.9, adult: Secondary | ICD-10-CM

## 2023-08-29 DIAGNOSIS — E66811 Obesity, class 1: Secondary | ICD-10-CM

## 2023-08-29 DIAGNOSIS — E6609 Other obesity due to excess calories: Secondary | ICD-10-CM

## 2023-08-29 DIAGNOSIS — Z0000 Encounter for general adult medical examination without abnormal findings: Secondary | ICD-10-CM | POA: Insufficient documentation

## 2023-08-29 DIAGNOSIS — I480 Paroxysmal atrial fibrillation: Secondary | ICD-10-CM

## 2023-08-29 DIAGNOSIS — I5032 Chronic diastolic (congestive) heart failure: Secondary | ICD-10-CM

## 2023-08-29 DIAGNOSIS — Z95811 Presence of heart assist device: Secondary | ICD-10-CM | POA: Insufficient documentation

## 2023-08-29 DIAGNOSIS — I130 Hypertensive heart and chronic kidney disease with heart failure and stage 1 through stage 4 chronic kidney disease, or unspecified chronic kidney disease: Secondary | ICD-10-CM | POA: Diagnosis not present

## 2023-08-29 DIAGNOSIS — J8410 Pulmonary fibrosis, unspecified: Secondary | ICD-10-CM

## 2023-08-29 LAB — POCT URINALYSIS DIPSTICK
Appearance: NEGATIVE
Bilirubin, UA: NEGATIVE
Blood, UA: NEGATIVE
Glucose, UA: NEGATIVE
Ketones, UA: NEGATIVE
Leukocytes, UA: NEGATIVE
Nitrite, UA: NEGATIVE
Protein, UA: NEGATIVE
Spec Grav, UA: 1.015 (ref 1.010–1.025)
Urobilinogen, UA: 0.2 U/dL
pH, UA: 6 (ref 5.0–8.0)

## 2023-08-29 NOTE — Patient Instructions (Signed)

## 2023-08-29 NOTE — Assessment & Plan Note (Signed)
 A full exam was performed.  Importance of monthly self breast exams was discussed with the patient.  She was examined while in chair. She is advised to get 3045 minutes of regular exercise, no less than four to five days per week. Both weight-bearing and aerobic exercises are recommended.  She is advised to follow a healthy diet with at least six fruits/veggies per day, decrease intake of red meat and other saturated fats and to increase fish intake to twice weekly.  Meats/fish should not be fried -- baked, boiled or broiled is preferable. It is also important to cut back on your sugar intake.  Be sure to read labels - try to avoid anything with added sugar, high fructose corn syrup or other sweeteners.  If you must use a sweetener, you can try stevia or monkfruit.  It is also important to avoid artificially sweetened foods/beverages and diet drinks. Lastly, wear SPF 50 sunscreen on exposed skin and when in direct sunlight for an extended period of time.  Be sure to avoid fast food restaurants and aim for at least 60 ounces of water daily.

## 2023-08-29 NOTE — Progress Notes (Signed)
 I,[name], CMA,acting as a Neurosurgeon for [name], MD.,have documented all relevant documentation on the behalf of [name], MD,as directed by  [name], MD while in the presence of [name], MD.  Subjective:    Patient ID: [name] , female    DOB: 31-Jul-1945 , 77 y.o.   MRN: [id number]  Chief Complaint  Patient presents with   Annual Exam   Diabetes   Hypertension    HPI  Patient presents today for annual exam. She reports compliance with medications. Denies having any headaches, chest pain or sob. She has no specific questions or concerns.  She will call to schedule mammogram.     Diabetes She presents for her follow-up diabetic visit. She has type 2 diabetes mellitus. Her disease course has been improving. There are no hypoglycemic associated symptoms. Pertinent negatives for diabetes include no blurred vision, no chest pain, no polydipsia, no polyphagia and no polyuria. There are no hypoglycemic complications. Diabetic complications include nephropathy. Risk factors for coronary artery disease include diabetes mellitus, dyslipidemia, hypertension, obesity, post-menopausal and sedentary lifestyle. She is compliant with treatment some of the time. Her weight is fluctuating minimally. She is following a generally healthy diet. She never participates in exercise. Her home blood glucose trend is decreasing steadily. Her breakfast blood glucose is taken between 8-9 am. Her breakfast blood glucose range is generally 130-140 mg/dl. An ACE inhibitor/angiotensin II receptor blocker is being taken. Eye exam is not current.  Hypertension This is a chronic problem. The current episode started more than 1 year ago. The problem has been gradually improving since onset. The problem is controlled. Pertinent negatives include no blurred vision, chest pain or palpitations. The current treatment provides moderate improvement. Hypertensive end-organ damage includes  kidney disease. Identifiable causes of hypertension include sleep apnea.     Past Medical History:  Diagnosis Date   Abnormal liver function     in the past.   Anemia    Arthritis    all over   Bruises easily    Cataract    right eye;immature   CHF (congestive heart failure) (HCC)    Chronic back pain    stenosis   Chronic cough    Chronic kidney disease    COPD (chronic obstructive pulmonary disease) (HCC)    COVID    aug/sept 2021   Demand myocardial infarction [facility]) 2012   Demand Infarction in setting of Pancreatitis --> mild Troponin elevation, NON-OBSTRUCTIVE CAD   Depression    takes Abilify daily as well as Zoloft   Diastolic heart failure 2010   Grade 1 diastolic Dysfunction by Echo    Diverticulosis    DM (diabetes mellitus) (HCC)    takes Victoza daily as well as Lantus and Humalog   Empty sella (HCC)    on MRI in 2009.   Glaucoma    Headache(784.0)    last migraine-4-66yrs ago   History of blood transfusion    no abnormal reaction noted   History of colon polyps    benign   HTN (hypertension)    takes Benicar,Imdur,and Bystolic daily   Hyperlipidemia    takes Lipitor daily   Joint swelling    Nocturia    Obesity hypoventilation syndrome (HCC)    Obstructive sleep apnea 02/2018   Notably improved split-night study with weight loss from 270 (2017) down to 250 pounds (2019).   Pancreatitis    takes Pancrelipase daily   Parkinson's disease (HCC)  takes Sinemet daily   Peripheral neuropathy    Pneumonia 2012   Seizures (HCC)    takes Lamictal daily and Primidone nightly;last seizure 2wks ago   Urinary urgency    With increased frequency   Varicose veins of both lower extremities with pain    With edema.  Takes daily Lasix     Family History  Problem Relation Age of Onset   Allergies Father    Heart disease Father        before 6   Heart failure Father    Hypertension Father    Hyperlipidemia Father    Heart disease Mother    Diabetes  Mother    Hypertension Mother    Hyperlipidemia Mother    Hypertension Sister    Heart disease Sister        before 36   Hyperlipidemia Sister    Diabetes Brother    Hypertension Brother    Diabetes Sister    Hypertension Sister    Diabetes Son    Hypertension Son    Cancer Other      Current Outpatient Medications:    acetaminophen (TYLENOL) 500 MG tablet, Take 500-1,000 mg by mouth every 4 (four) hours as needed for moderate pain or mild pain., Disp: , Rfl:    albuterol (VENTOLIN HFA) 108 (90 Base) MCG/ACT inhaler, Inhale 1-2 puffs into the lungs every 6 (six) hours as needed for wheezing or shortness of breath., Disp: 17 each, Rfl: 5   Alcaftadine (LASTACAFT) 0.25 % SOLN, Place 1-2 drops into both eyes daily as needed (allergy eye/irritation.)., Disp: , Rfl:    Alcohol Swabs (DROPSAFE ALCOHOL PREP) 70 % PADS, USE TWO TIMES DAILY AS DIRECTED, Disp: 200 each, Rfl: 3   amiodarone (PACERONE) 100 MG tablet, TAKE 1 TABLET EVERY DAY, Disp: 90 tablet, Rfl: 3   apixaban (ELIQUIS) 5 MG TABS tablet, Take 1 tablet (5 mg total) by mouth 2 (two) times daily., Disp: 180 tablet, Rfl: 3   Ascorbic Acid (VITAMIN C) 1000 MG tablet, Take 1,000 mg by mouth in the morning., Disp: , Rfl:    atorvastatin (LIPITOR) 40 MG tablet, Take 1 tablet (40 mg total) by mouth daily., Disp: 90 tablet, Rfl: 3   B-D ULTRAFINE III SHORT PEN 31G X 8 MM MISC, USE DAILY WITH VICTOZA AND/OR NOVOLOG., Disp: 400 each, Rfl: 2   Blood Glucose Monitoring Suppl (TRUE METRIX AIR GLUCOSE METER) w/Device KIT, USE AS DIRECTED, Disp: 1 kit, Rfl: 3   carvedilol (COREG) 12.5 MG tablet, TAKE 1 TABLET TWICE DAILY, Disp: 180 tablet, Rfl: 3   diclofenac sodium (VOLTAREN) 1 % GEL, Apply 2 g topically 4 (four) times daily as needed (pain)., Disp: , Rfl:    FeFum-FePoly-FA-B Cmp-C-Biot (INTEGRA PLUS) CAPS, Take 1 capsule by mouth every morning., Disp: 30 capsule, Rfl: 2   ferrous sulfate 324 MG TBEC, Take 324 mg by mouth daily with breakfast.,  Disp: , Rfl:    furosemide (LASIX) 20 MG tablet, Take 4 tablets (80 mg total) by mouth twice a day., Disp: 720 tablet, Rfl: 3   Glucagon (GVOKE HYPOPEN 2-PACK) 0.5 MG/0.1ML SOAJ, INJECT 0.5 MG (1 PEN) INTO THE SKIN DAILY AS NEEDED., Disp: 2 mL, Rfl: 11   guaiFENesin-dextromethorphan (ROBITUSSIN DM) 100-10 MG/5ML syrup, Take 10 mLs by mouth every 6 (six) hours as needed for cough., Disp: 118 mL, Rfl: 0   hydrALAZINE (APRESOLINE) 100 MG tablet, Take 1 tablet (100 mg total) by mouth 3 (three) times daily., Disp: 270 tablet,  Rfl: 3   hydrocortisone cream 1 %, Apply 1 application topically daily as needed for itching. , Disp: , Rfl:    Insulin Glargine (TOUJEO SOLOSTAR ), Inject 20 Units into the skin at bedtime., Disp: , Rfl:    insulin lispro (HUMALOG KWIKPEN) 100 UNIT/ML KwikPen, Inject 0-10 Units into the skin See admin instructions. Sliding scale:Inject  (high blood sugar over 150). 150-199=2 units;200-249=4 units,250-299=6 units;300-349=8 units;350 above 10 units: Over 350 call physician, Disp: 15 mL, Rfl: 0   Isopropyl Alcohol (ALCOHOL WIPES) 70 % MISC, Apply 1 each topically 2 (two) times daily., Disp: 300 each, Rfl: 3   isosorbide mononitrate (IMDUR) 30 MG 24 hr tablet, TAKE 1 TABLET EVERY DAY, Disp: 90 tablet, Rfl: 2   lamoTRIgine (LAMICTAL) 200 MG tablet, Take 1 tablet (200 mg total) by mouth 2 (two) times daily., Disp: 180 tablet, Rfl: 3   levETIRAcetam (KEPPRA) 500 MG tablet, Take 2 tablets every night, Disp: 180 tablet, Rfl: 3   loratadine (CLARITIN) 10 MG tablet, Take 1 tablet (10 mg total) by mouth daily., Disp: 30 tablet, Rfl: 2   oxyCODONE (OXY IR/ROXICODONE) 5 MG immediate release tablet, Take 5 mg by mouth every 6 (six) hours as needed (pain.)., Disp: , Rfl:    OZEMPIC, 1 MG/DOSE, 4 MG/3ML SOPN, INJECT 1MG  UNDER THE SKIN ONE TIME WEEKLY ON WEDNESDAYS, Disp: 3 mL, Rfl: 3   Pancrelipase, Lip-Prot-Amyl, 25000-79000 units CPEP, Take 2 capsules by mouth in the morning, at noon, and at  bedtime., Disp: , Rfl:    potassium chloride SA (KLOR-CON M) 20 MEQ tablet, Take 3 tablets (60 mEq total) by mouth daily., Disp: 300 tablet, Rfl: 3   sennosides-docusate sodium (SENOKOT-S) 8.6-50 MG tablet, Take 1-2 tablets by mouth at bedtime as needed for constipation., Disp: , Rfl:    sertraline (ZOLOFT) 50 MG tablet, TAKE 1 TABLET EVERY DAY, Disp: 90 tablet, Rfl: 3   tiZANidine (ZANAFLEX) 2 MG tablet, TAKE 1 TABLET AT BEDTIME AS NEEDED, Disp: 30 tablet, Rfl: 11   tolnaftate (TINACTIN) 1 % cream, Apply 1 application topically daily as needed (foot fungus). , Disp: , Rfl:    TRUE METRIX BLOOD GLUCOSE TEST test strip, USE AS DIRECTED TO CHECK BLOOD SUGARS 2 TIMES PER DAY, Disp: 200 strip, Rfl: 3   TRUEplus Lancets 33G MISC, TEST BLOOD SUGAR TWICE DAILY AS DIRECTED, Disp: 200 each, Rfl: 3   benzonatate (TESSALON) 100 MG capsule, Take 1 capsule by mouth three times daily as needed for cough, Disp: 30 capsule, Rfl: 0 No current facility-administered medications for this visit.  Facility-Administered Medications Ordered in Other Visits:    0.9 %  sodium chloride infusion (Manually program via Guardrails IV Fluids), , Intravenous, Once, [place], Jessica M, FNP   0.9 %  sodium chloride infusion (Manually program via Guardrails IV Fluids), , Intravenous, Once, [place], Jessica M, FNP   Allergies  Allergen Reactions   Other Anaphylaxis and Rash    Bleach Cigarette smoke/ Cough   Penicillins Hives    Did it involve swelling of the face/tongue/throat, SOB, or low BP? No Did it involve sudden or severe rash/hives, skin peeling, or any reaction on the inside of your mouth or nose? Yes Did you need to seek medical attention at a hospital or doctor's office? Yes When did it last happen?      Childhood allergy  If all above answers are "NO", may proceed with cephalosporin use.     Sulfa Antibiotics Hives   Aspirin Other (See Comments)  On aspirin 81 mg - Rectal bleeding in dec 2018   Codeine      Headache and makes the patient feel "off"      The patient states she uses post menopausal status for birth control. No LMP recorded. Patient has had a hysterectomy.. Negative for Dysmenorrhea. Negative for: breast discharge, breast lump(s), breast pain and breast self exam. Associated symptoms include abnormal vaginal bleeding. Pertinent negatives include abnormal bleeding (hematology), anxiety, decreased libido, depression, difficulty falling sleep, dyspareunia, history of infertility, nocturia, sexual dysfunction, sleep disturbances, urinary incontinence, urinary urgency, vaginal discharge and vaginal itching. Diet regular.The patient states her exercise level is    . The patient's tobacco use is:  Social History   Tobacco Use  Smoking Status Former   Current packs/day: 0.50   Average packs/day: 0.5 packs/day for 25.0 years (12.5 ttl pk-yrs)   Types: Cigarettes  Smokeless Tobacco Never  Tobacco Comments   quit smoking 20+ytrs ago  . She has been exposed to passive smoke. The patient's alcohol use is:  Social History   Substance and Sexual Activity  Alcohol Use No    Review of Systems  Constitutional: Negative.   HENT: Negative.    Eyes: Negative.  Negative for blurred vision.  Respiratory: Negative.    Cardiovascular: Negative.  Negative for chest pain and palpitations.  Gastrointestinal: Negative.   Endocrine: Negative.  Negative for polydipsia, polyphagia and polyuria.  Genitourinary: Negative.   Musculoskeletal: Negative.   Skin: Negative.   Allergic/Immunologic: Negative.   Neurological: Negative.   Hematological: Negative.   Psychiatric/Behavioral: Negative.       Today's Vitals   08/29/23 1118  BP: 104/60  Pulse: 62  Temp: 98 F (36.7 C)  SpO2: 98%  Weight: 183 lb 6.4 oz (83.2 kg)  Height: 5\' 1"  (1.549 m)   Body mass index is 34.65 kg/m.  Wt Readings from Last 3 Encounters:  09/04/23 179 lb 6.4 oz (81.4 kg)  08/29/23 183 lb 6.4 oz (83.2 kg)  07/22/23  176 lb 6.4 oz (80 kg)     Objective:  Physical Exam Vitals and nursing note reviewed.  Constitutional:      Appearance: Normal appearance. She is obese.     Comments: She was examined while in chair  HENT:     Head: Normocephalic and atraumatic.     Right Ear: Tympanic membrane, ear canal and external ear normal.     Left Ear: Tympanic membrane, ear canal and external ear normal.  Eyes:     Extraocular Movements: Extraocular movements intact.     Conjunctiva/sclera: Conjunctivae normal.     Pupils: Pupils are equal, round, and reactive to light.  Cardiovascular:     Rate and Rhythm: Normal rate and regular rhythm.     Pulses: Normal pulses.     Heart sounds: Normal heart sounds.  Pulmonary:     Effort: Pulmonary effort is normal.     Breath sounds: Normal breath sounds.  Chest:  Breasts:    Tanner Score is 5.     Right: Normal.     Left: Normal.  Abdominal:     General: Bowel sounds are normal.     Palpations: Abdomen is soft.     Comments: Obese, soft  Genitourinary:    Comments: deferred Musculoskeletal:     Cervical back: Normal range of motion and neck supple.     Right lower leg: Edema present.     Left lower leg: Edema present.     Right foot:  Deformity present.     Left foot: Deformity present.     Comments: Ambulatory with cane  Feet:     Right foot:     Protective Sensation: 5 sites tested.  3 sites sensed.     Left foot:     Protective Sensation: 5 sites tested.  3 sites sensed.  Skin:    General: Skin is warm and dry.  Neurological:     General: No focal deficit present.     Mental Status: She is alert and oriented to person, place, and time.  Psychiatric:        Mood and Affect: Mood normal.        Behavior: Behavior normal.         Assessment And Plan:     Encounter for annual health examination Assessment & Plan: A full exam was performed.  Importance of monthly self breast exams was discussed with the patient.  She was examined while in  chair. She is advised to get 3045 minutes of regular exercise, no less than four to five days per week. Both weight-bearing and aerobic exercises are recommended.  She is advised to follow a healthy diet with at least six fruits/veggies per day, decrease intake of red meat and other saturated fats and to increase fish intake to twice weekly.  Meats/fish should not be fried -- baked, boiled or broiled is preferable. It is also important to cut back on your sugar intake.  Be sure to read labels - try to avoid anything with added sugar, high fructose corn syrup or other sweeteners.  If you must use a sweetener, you can try stevia or monkfruit.  It is also important to avoid artificially sweetened foods/beverages and diet drinks. Lastly, wear SPF 50 sunscreen on exposed skin and when in direct sunlight for an extended period of time.  Be sure to avoid fast food restaurants and aim for at least 60 ounces of water daily.      Orders: -     Lipid panel  Type 2 diabetes mellitus with stage 2 chronic kidney disease, with long-term current use of insulin (HCC) Assessment & Plan: Chronic, diabetic foot exam was performed.  She is currently on semaglutide 1mg  weekly. She is encouraged to gradually increase her daily activity. She will f/u in four months. I DISCUSSED WITH THE PATIENT AT LENGTH REGARDING THE GOALS OF GLYCEMIC CONTROL AND POSSIBLE LONG-TERM COMPLICATIONS.  I  ALSO STRESSED THE IMPORTANCE OF COMPLIANCE WITH HOME GLUCOSE MONITORING, DIETARY RESTRICTIONS INCLUDING AVOIDANCE OF SUGARY DRINKS/PROCESSED FOODS,  ALONG WITH REGULAR EXERCISE.  I  ALSO STRESSED THE IMPORTANCE OF ANNUAL EYE EXAMS, SELF FOOT CARE AND COMPLIANCE WITH OFFICE VISITS.   Orders: -     Hemoglobin A1c -     POCT urinalysis dipstick -     Microalbumin / creatinine urine ratio -     EKG 12-Lead  Hypertensive heart and renal disease with renal failure, stage 1 through stage 4 or unspecified chronic kidney disease, with heart failure  (HCC) Assessment & Plan: Chronic, controlled. EKG performed, SB w/o acute changes.  She will continue with current meds including carvedilol 12.5mg  twice daily, isosorbide mononitrate 30mg  daily, furosemide 80mg  twice daily and hydralazine 100mg  po tid Importance of dietary compliance was discussed with the patient.   Orders: -     CBC -     CMP14+EGFR -     POCT urinalysis dipstick -     Microalbumin / creatinine urine ratio -  EKG 12-Lead  Chronic diastolic CHF (congestive heart failure), NYHA class 2 (HCC) Assessment & Plan: Chronic, appears stable. Importance of dietary/medication compliance was discussed with the patient. She is encouraged to gradually increase her daily activity.    Paroxysmal atrial fibrillation [facility]) Assessment & Plan: Chronic, currently on Eliquis for anticoagulation. She is rate controlled on carvedilol.    Recurrent depression (HCC) Assessment & Plan: Chronic, sx currently stable on sertraline and lamotrigine.    Rheumatoid arthritis involving both feet, unspecified whether rheumatoid factor present [facility]) Assessment & Plan: Chronic, as per Podiatry. Encouraged to follow anti-inflammatory diet. She was referred to Rheum by Podiatry in 2021.   Class 1 obesity due to excess calories with serious comorbidity and body mass index (BMI) of 34.0 to 34.9 in adult Assessment & Plan: She is encouraged to strive for BMI less than 30 to decrease cardiac risk. Advised to aim for at least 150 minutes of exercise per week.    Acquired thrombophilia (HCC) Assessment & Plan: Chronic, currently on Eliquis due to underlying PAF.       Return for 1 year HM, 4 month dm f/u. Marland Kitchen Patient was given opportunity to ask questions. Patient verbalized understanding of the plan and was able to repeat key elements of the plan. All questions were answered to their satisfaction.    I, [name], MD, have reviewed all documentation for this visit. The documentation on  08/29/23 for the exam, diagnosis, procedures, and orders are all accurate and complete.

## 2023-08-30 LAB — CMP14+EGFR
ALT: 23 IU/L (ref 0–32)
AST: 24 IU/L (ref 0–40)
Albumin: 4 g/dL (ref 3.8–4.8)
Alkaline Phosphatase: 139 IU/L — ABNORMAL HIGH (ref 44–121)
BUN/Creatinine Ratio: 14 (ref 12–28)
BUN: 13 mg/dL (ref 8–27)
Bilirubin Total: 0.3 mg/dL (ref 0.0–1.2)
CO2: 26 mmol/L (ref 20–29)
Calcium: 8.9 mg/dL (ref 8.7–10.3)
Chloride: 100 mmol/L (ref 96–106)
Creatinine, Ser: 0.94 mg/dL (ref 0.57–1.00)
Globulin, Total: 3.2 g/dL (ref 1.5–4.5)
Glucose: 91 mg/dL (ref 70–99)
Potassium: 4 mmol/L (ref 3.5–5.2)
Sodium: 141 mmol/L (ref 134–144)
Total Protein: 7.2 g/dL (ref 6.0–8.5)
eGFR: 63 mL/min/{1.73_m2} (ref >59)

## 2023-08-30 LAB — CBC
Hematocrit: 33.5 % — ABNORMAL LOW (ref 34.0–46.6)
Hemoglobin: 10.3 g/dL — ABNORMAL LOW (ref 11.1–15.9)
MCH: 23.9 pg — ABNORMAL LOW (ref 26.6–33.0)
MCHC: 30.7 g/dL — ABNORMAL LOW (ref 31.5–35.7)
MCV: 78 fL — ABNORMAL LOW (ref 79–97)
Platelets: 265 10*3/uL (ref 150–450)
RBC: 4.31 x10E6/uL (ref 3.77–5.28)
RDW: 15.8 % — ABNORMAL HIGH (ref 11.7–15.4)
WBC: 4.1 10*3/uL (ref 3.4–10.8)

## 2023-08-30 LAB — LIPID PANEL
Chol/HDL Ratio: 1.8 ratio (ref 0.0–4.4)
Cholesterol, Total: 149 mg/dL (ref 100–199)
HDL: 85 mg/dL (ref >39)
LDL Chol Calc (NIH): 54 mg/dL (ref 0–99)
Triglycerides: 43 mg/dL (ref 0–149)
VLDL Cholesterol Cal: 10 mg/dL (ref 5–40)

## 2023-08-30 LAB — HEMOGLOBIN A1C
Est. average glucose Bld gHb Est-mCnc: 192 mg/dL
Hgb A1c MFr Bld: 8.3 % — ABNORMAL HIGH (ref 4.8–5.6)

## 2023-08-30 LAB — MICROALBUMIN / CREATININE URINE RATIO
Creatinine, Urine: 139.4 mg/dL
Microalb/Creat Ratio: 11 mg/g{creat} (ref 0–29)
Microalbumin, Urine: 15.9 ug/mL

## 2023-08-30 NOTE — Progress Notes (Signed)
 [facility] OFFICE PROGRESS NOTE  [name], MD [address] [place] [address]  DIAGNOSIS: Iron deficiency anemia   PRIOR THERAPY: None  CURRENT THERAPY: 1) oral iron supplement OTC tablet p.o. daily 2) IV iron as needed most recent with Ferrlecit 250 mg on 12/04/2022.   INTERVAL HISTORY: [name] 78 y.o. female returns to the clinic today for a follow-up visit.  The patient was last seen in the clinic by Dr. Arbutus Ped on 06/03/2023.  The patient is followed for her history of iron deficiency anemia.  Her most recent IV iron infusion was with Ferrlecit, her most recent being on 12/04/2022. She is taking over-the-counter iron supplements as Integra plus was too expensive for her. This causes constipation so she takes stool softener.    Her energy is "up and down". She has a history of GI bleeding and follows with Dr. Dulce Sellar.  She denies any significant visible gingival bleeding, epistaxis, hemoptysis, hematemesis, or melena except one day last week she did spit up blood but it has not happened since. The patient does take Eliquis.  Denies any NSAID use. She is not a vegan or vegetarian, although she does not eat a lot of red meat due to preference.  No history of bariatric surgery.  She does follow closely with pulmonary medicine for pulmonary fibrosis and chronic cough. Her shortness of breath with exertion is the same.  The patient is here today for evaluation repeat blood work.    MEDICAL HISTORY: Past Medical History:  Diagnosis Date   Abnormal liver function     in the past.   Anemia    Arthritis    all over   Bruises easily    Cataract    right eye;immature   CHF (congestive heart failure) (HCC)    Chronic back pain    stenosis   Chronic cough    Chronic kidney disease    COPD (chronic obstructive pulmonary disease) (HCC)    COVID    aug/sept 2021   Demand myocardial infarction [facility]) 2012   Demand Infarction in setting of  Pancreatitis --> mild Troponin elevation, NON-OBSTRUCTIVE CAD   Depression    takes Abilify daily as well as Zoloft   Diastolic heart failure 2010   Grade 1 diastolic Dysfunction by Echo    Diverticulosis    DM (diabetes mellitus) (HCC)    takes Victoza daily as well as Lantus and Humalog   Empty sella (HCC)    on MRI in 2009.   Glaucoma    Headache(784.0)    last migraine-4-71yrs ago   History of blood transfusion    no abnormal reaction noted   History of colon polyps    benign   HTN (hypertension)    takes Benicar,Imdur,and Bystolic daily   Hyperlipidemia    takes Lipitor daily   Joint swelling    Nocturia    Obesity hypoventilation syndrome (HCC)    Obstructive sleep apnea 02/2018   Notably improved split-night study with weight loss from 270 (2017) down to 250 pounds (2019).   Pancreatitis    takes Pancrelipase daily   Parkinson's disease (HCC)    takes Sinemet daily   Peripheral neuropathy    Pneumonia 2012   Seizures (HCC)    takes Lamictal daily and Primidone nightly;last seizure 2wks ago   Urinary urgency    With increased frequency   Varicose veins of both lower extremities with pain    With edema.  Takes  daily Lasix    ALLERGIES:  is allergic to other, penicillins, sulfa antibiotics, aspirin, and codeine.  MEDICATIONS:  Current Outpatient Medications  Medication Sig Dispense Refill   acetaminophen (TYLENOL) 500 MG tablet Take 500-1,000 mg by mouth every 4 (four) hours as needed for moderate pain or mild pain.     albuterol (VENTOLIN HFA) 108 (90 Base) MCG/ACT inhaler Inhale 1-2 puffs into the lungs every 6 (six) hours as needed for wheezing or shortness of breath. 17 each 5   Alcaftadine (LASTACAFT) 0.25 % SOLN Place 1-2 drops into both eyes daily as needed (allergy eye/irritation.).     Alcohol Swabs (DROPSAFE ALCOHOL PREP) 70 % PADS USE TWO TIMES DAILY AS DIRECTED 200 each 3   amiodarone (PACERONE) 100 MG tablet TAKE 1 TABLET EVERY DAY 90 tablet 3    apixaban (ELIQUIS) 5 MG TABS tablet Take 1 tablet (5 mg total) by mouth 2 (two) times daily. 180 tablet 3   Ascorbic Acid (VITAMIN C) 1000 MG tablet Take 1,000 mg by mouth in the morning.     atorvastatin (LIPITOR) 40 MG tablet Take 1 tablet (40 mg total) by mouth daily. 90 tablet 3   B-D ULTRAFINE III SHORT PEN 31G X 8 MM MISC USE DAILY WITH VICTOZA AND/OR NOVOLOG. 400 each 2   benzonatate (TESSALON) 100 MG capsule Take 1 capsule by mouth three times daily as needed for cough 30 capsule 0   Blood Glucose Monitoring Suppl (TRUE METRIX AIR GLUCOSE METER) w/Device KIT USE AS DIRECTED 1 kit 3   carvedilol (COREG) 12.5 MG tablet TAKE 1 TABLET TWICE DAILY 180 tablet 3   diclofenac sodium (VOLTAREN) 1 % GEL Apply 2 g topically 4 (four) times daily as needed (pain).     FeFum-FePoly-FA-B Cmp-C-Biot (INTEGRA PLUS) CAPS Take 1 capsule by mouth every morning. 30 capsule 2   ferrous sulfate 324 MG TBEC Take 324 mg by mouth daily with breakfast.     furosemide (LASIX) 20 MG tablet Take 4 tablets (80 mg total) by mouth twice a day. 720 tablet 3   Glucagon (GVOKE HYPOPEN 2-PACK) 0.5 MG/0.1ML SOAJ INJECT 0.5 MG (1 PEN) INTO THE SKIN DAILY AS NEEDED. 2 mL 11   guaiFENesin-dextromethorphan (ROBITUSSIN DM) 100-10 MG/5ML syrup Take 10 mLs by mouth every 6 (six) hours as needed for cough. 118 mL 0   hydrALAZINE (APRESOLINE) 100 MG tablet Take 1 tablet (100 mg total) by mouth 3 (three) times daily. 270 tablet 3   hydrocortisone cream 1 % Apply 1 application topically daily as needed for itching.      Insulin Glargine (TOUJEO SOLOSTAR [place]) Inject 20 Units into the skin at bedtime.     insulin lispro (HUMALOG KWIKPEN) 100 UNIT/ML KwikPen Inject 0-10 Units into the skin See admin instructions. Sliding scale:Inject  (high blood sugar over 150). 150-199=2 units;200-249=4 units,250-299=6 units;300-349=8 units;350 above 10 units: Over 350 call physician 15 mL 0   Isopropyl Alcohol (ALCOHOL WIPES) 70 % MISC Apply 1 each  topically 2 (two) times daily. 300 each 3   isosorbide mononitrate (IMDUR) 30 MG 24 hr tablet TAKE 1 TABLET EVERY DAY 90 tablet 2   lamoTRIgine (LAMICTAL) 200 MG tablet Take 1 tablet (200 mg total) by mouth 2 (two) times daily. 180 tablet 3   levETIRAcetam (KEPPRA) 500 MG tablet Take 2 tablets every night 180 tablet 3   loratadine (CLARITIN) 10 MG tablet Take 1 tablet (10 mg total) by mouth daily. 30 tablet 2   oxyCODONE (OXY IR/ROXICODONE) 5  MG immediate release tablet Take 5 mg by mouth every 6 (six) hours as needed (pain.).     OZEMPIC, 1 MG/DOSE, 4 MG/3ML SOPN INJECT 1MG  UNDER THE SKIN ONE TIME WEEKLY ON WEDNESDAYS 3 mL 3   Pancrelipase, Lip-Prot-Amyl, 25000-79000 units CPEP Take 2 capsules by mouth in the morning, at noon, and at bedtime.     potassium chloride SA (KLOR-CON M) 20 MEQ tablet Take 3 tablets (60 mEq total) by mouth daily. 300 tablet 3   sennosides-docusate sodium (SENOKOT-S) 8.6-50 MG tablet Take 1-2 tablets by mouth at bedtime as needed for constipation.     sertraline (ZOLOFT) 50 MG tablet TAKE 1 TABLET EVERY DAY 90 tablet 3   tiZANidine (ZANAFLEX) 2 MG tablet TAKE 1 TABLET AT BEDTIME AS NEEDED 30 tablet 11   tolnaftate (TINACTIN) 1 % cream Apply 1 application topically daily as needed (foot fungus).      TRUE METRIX BLOOD GLUCOSE TEST test strip USE AS DIRECTED TO CHECK BLOOD SUGARS 2 TIMES PER DAY 200 strip 3   TRUEplus Lancets 33G MISC TEST BLOOD SUGAR TWICE DAILY AS DIRECTED 200 each 3   No current facility-administered medications for this visit.   Facility-Administered Medications Ordered in Other Visits  Medication Dose Route Frequency Provider Last Rate Last Admin   0.9 %  sodium chloride infusion (Manually program via Guardrails IV Fluids)   Intravenous Once [place], Anderson Malta, FNP       0.9 %  sodium chloride infusion (Manually program via Guardrails IV Fluids)   Intravenous Once [place], Anderson Malta, FNP        SURGICAL HISTORY:  Past Surgical History:   Procedure Laterality Date   ABDOMINAL HYSTERECTOMY     APPENDECTOMY     ATRIAL SEPTAL DEFECT(ASD) CLOSURE N/A 01/02/2023   Procedure: ATRIAL SEPTAL DEFECT(ASD) CLOSURE;  Surgeon: Tonny Bollman, MD;  Location: [facility] INVASIVE CV LAB;  Service: Cardiovascular;  Laterality: N/A;   BACK SURGERY     Cardiac Event Monitor  September-October 2017   Sinus rhythm with occasional PACs and artifact. No arrhythmias besides one short run of tachycardia.   CARDIOVERSION N/A 08/15/2022   Procedure: CARDIOVERSION;  Surgeon: Laurey Morale, MD;  Location: [facility] ENDOSCOPY;  Service: Cardiovascular;  Laterality: N/A;   CARDIOVERSION N/A 02/04/2023   Procedure: CARDIOVERSION;  Surgeon: Laurey Morale, MD;  Location: [facility] INVASIVE CV LAB;  Service: Cardiovascular;  Laterality: N/A;   CHOLECYSTECTOMY     COLONOSCOPY N/A 08/15/2012   Procedure: COLONOSCOPY;  Surgeon: Willis Modena, MD;  Location: [facility] ENDOSCOPY;  Service: Endoscopy;  Laterality: N/A;   COLONOSCOPY WITH PROPOFOL Left 06/17/2017   Procedure: COLONOSCOPY WITH PROPOFOL;  Surgeon: Kerin Salen, MD;  Location: [facility] ENDOSCOPY;  Service: Gastroenterology;  Laterality: Left;   COLONOSCOPY WITH PROPOFOL N/A 04/03/2021   Procedure: COLONOSCOPY WITH PROPOFOL;  Surgeon: Vida Rigger, MD;  Location: [facility] ENDOSCOPY;  Service: Endoscopy;  Laterality: N/A;   CORONARY CALCIUM SCORE AND CTA  04/2018   Coronary calcium score 71.9.  Very large, hyperdynamic LAD wrapping apex giving rise to PDA.  Moderate LAD-diagonal and circumflex plaque -> CT FFR suggested positive findings in distal D1, D2 and distal circumflex.  Referred for cath. ==> FALSE POSITIVE   ESOPHAGOGASTRODUODENOSCOPY     eye cysts Bilateral    LASIK     LEFT HEART CATH AND CORONARY ANGIOGRAPHY N/A 05/16/2018   Procedure: LEFT HEART CATH AND CORONARY ANGIOGRAPHY;  Surgeon: Lyn Records, MD;  Location: [facility] INVASIVE CV LAB:  Angiographically normal  coronary arteries with LVEDP of 21 mmHg.  Large draping hyperdominant  LAD that wraps the apex and provides distal half of the PDA.  Relatively small caliber distal Cx -->proxLPDA, non-dom RCA. No Cx or Diag lesions -- FALSE + CT FFR   LEFT HEART CATH AND CORONARY ANGIOGRAPHY  2009/March 2012   2009: (Dr. Janene Harvey) Nonobstructive CAD; 2012: Minimal CAD --> false positive stress test   NM MYOVIEW LTD  01/2016; 01/2018   a)LOW RISK. No ischemia or infarction;; b) EF 55-60%. NO ST changes.  No ischemia or Infarction. NO significant RV enlargement.  LOW RISK.   POLYPECTOMY  04/03/2021   Procedure: POLYPECTOMY;  Surgeon: Vida Rigger, MD;  Location: [facility] ENDOSCOPY;  Service: Endoscopy;;   Polysomnogram  02/2018   (Dr. Vickey Huger from neurology): Split study was not adequately done due to low AHI.  She slept reclined and had an AHI less than 10, prolonged hypoxemia and no hypercapnia. -->  She has home oxygen already prescribed, but did not meet criteria for nightly oxygen.  (Was noted that the patient did not cooperate well with study.  Titration was discussed.   PRESSURE SENSOR/CARDIOMEMS N/A 11/09/2019   Procedure: PRESSURE SENSOR/CARDIOMEMS;  Surgeon: Laurey Morale, MD;  Location: [facility] INVASIVE CV LAB;  Service: Cardiovascular;  Laterality: N/A;   RECTAL POLYPECTOMY     RIGHT HEART CATH N/A 09/07/2022   Procedure: RIGHT HEART CATH;  Surgeon: Laurey Morale, MD;  Location: [facility] INVASIVE CV LAB;  Service: Cardiovascular;  Laterality: N/A;   RIGHT/LEFT HEART CATH AND CORONARY ANGIOGRAPHY N/A 04/06/2019   Procedure: RIGHT/LEFT HEART CATH AND CORONARY ANGIOGRAPHY;  Surgeon: Marykay Lex, MD;  Location: [facility] INVASIVE CV LAB; angiographically normal coronary arteries.  Moderately elevated AD LVEDP..  Mild pulmonary hypertension: PA P 56/14 mmHg-mean 31 mmHg.  RAP 10 mmHg.  RV P-EDP 54/4 mmHg - 12 mmHg.  PCWP 11-15 mmHg.  CO-CI: 7.35-3.73.   TEE WITHOUT CARDIOVERSION N/A 08/15/2022   Procedure: TRANSESOPHAGEAL ECHOCARDIOGRAM (TEE);  Surgeon: Laurey Morale, MD;  Location: [facility]  ENDOSCOPY;  Service: Cardiovascular;  Laterality: N/A;   TONSILLECTOMY     TRANSTHORACIC ECHOCARDIOGRAM  01/2016; 05/2017   A) Normal LV chamber size with mod LVH pattern. EF 50-55%. Severe LA dilation. Mod RA dilation. PAP elevated at 38 mmHg;; B) Mild concentric LVH.  EF 55-60% & no RWMA.  Grade 2 DD.  Diastolic flattening of the ventricular septum == ? elevated PAP.  Mild aortic valve calcification/sclerosis.  Mod LA dilation.  Mild RV dilation & Mod RA dilation   TRANSTHORACIC ECHOCARDIOGRAM  10/2017; 01/2018   a) EF 55-60%.  No RWMA,  Moderate LA dilation.  Mild LA dilation.  Peak PA pressures ~57 mmHg (moderate);;; b) EF 55-60%.  GRII DD.  Suggestion of RV volume overload with moderate RV dilation.  Severe LA and RA dilation.Marland Kitchen  PA pressure ~67%.   TRANSTHORACIC ECHOCARDIOGRAM  01/2019   Normal LV size and function.  EF 50 to 55%.  Impaired relaxation.  RV appears to have moderately reduced function.  Severely enlarged.  Cannot fully assess RV pressures.  Hypermobile interatrial septum.  Right atrium severely dilated.   TUBAL LIGATION     vaginal cyst removed     several times    REVIEW OF SYSTEMS:   Review of Systems  Constitutional: Positive for fatigue. Negative for appetite change, chills, fever and unexpected weight change.  HENT: Negative for mouth sores, nosebleeds, sore throat and trouble swallowing.   Eyes: Negative for eye  problems and icterus.  Respiratory: Positive for stable dyspnea on exertion. Negative for cough, hemoptysis, and wheezing.   Cardiovascular: Negative for chest pain. Positive for stable bilateral lower extremity swelling.  Gastrointestinal: Negative for abdominal pain, constipation, diarrhea, nausea and vomiting.  Genitourinary: Negative for bladder incontinence, difficulty urinating, dysuria, frequency and hematuria.   Musculoskeletal: Positive for foot and ankle pain (sees a provider for this). Negative for back pain, gait problem, neck pain and neck  stiffness.  Skin: Negative for itching and rash.  Neurological: Negative for dizziness, extremity weakness, gait problem, headaches, light-headedness and seizures.  Hematological: Negative for adenopathy. Does not bruise/bleed easily.  Psychiatric/Behavioral: Negative for confusion, depression and sleep disturbance. The patient is not nervous/anxious.     PHYSICAL EXAMINATION:  There were no vitals taken for this visit.  ECOG PERFORMANCE STATUS: 1-2  Physical Exam  Constitutional: Oriented to person, place, and time and well-developed, well-nourished, and in no distress.  HENT:  Head: Normocephalic and atraumatic.  Mouth/Throat: Oropharynx is clear and moist. No oropharyngeal exudate.  Eyes: Conjunctivae are normal. Right eye exhibits no discharge. Left eye exhibits no discharge. No scleral icterus.  Neck: Normal range of motion. Neck supple.  Cardiovascular: Normal rate, regular rhythm, positive for mumur and intact distal pulses.   Pulmonary/Chest: Effort normal and breath sounds normal. No respiratory distress. No wheezes. No rales.  Abdominal: Soft. Bowel sounds are normal. Exhibits no distension and no mass. There is no tenderness.  Musculoskeletal: Normal range of motion. Bilateral lower extremity pitting edema.  Lymphadenopathy:    No cervical adenopathy.  Neurological: Alert and oriented to person, place, and time. Exhibits normal muscle tone. Ambulates with a cane Skin: Skin is warm and dry. No rash noted. Not diaphoretic. No erythema. No pallor.  Psychiatric: Mood, memory and judgment normal.  Vitals reviewed  LABORATORY DATA: Lab Results  Component Value Date   WBC 4.1 08/29/2023   HGB 10.3 (L) 08/29/2023   HCT 33.5 (L) 08/29/2023   MCV 78 (L) 08/29/2023   PLT 265 08/29/2023      Chemistry      Component Value Date/Time   NA 141 08/29/2023 1300   K 4.0 08/29/2023 1300   CL 100 08/29/2023 1300   CO2 26 08/29/2023 1300   BUN 13 08/29/2023 1300   CREATININE  0.94 08/29/2023 1300   CREATININE 0.97 04/04/2023 1310   CREATININE 0.91 10/04/2016 1144      Component Value Date/Time   CALCIUM 8.9 08/29/2023 1300   ALKPHOS 139 (H) 08/29/2023 1300   AST 24 08/29/2023 1300   AST 29 04/04/2023 1310   ALT 23 08/29/2023 1300   ALT 23 04/04/2023 1310   BILITOT 0.3 08/29/2023 1300   BILITOT 0.3 04/04/2023 1310       RADIOGRAPHIC STUDIES:  No results found.   ASSESSMENT/PLAN:  This is a very pleasant 78 year old African-American female referred to the clinic for iron deficiency anemia.   The patient is currently taking oral iron supplements with OTC iron p.o. daily and she is compliant with this.  She also receives IV iron as needed most recent being with Ferrlecit 250 mg on 12/04/2022.   This patient had several lab studies performed today including a CBC, iron studies, and ferritin.  The patients labs from today demonstrated persistent anemia with a hemoglobin of 9.8 and continued and worsening microcytosis.  Her other lab studies are pending.   My feeling is she will require additional IV iron. I will place order for Venofer 300  mg weekly x 3 starting next week.  She prefers for her iron to be performed at the [facility] instead of going to another facility.    I strongly encouraged her to continue taking her iron supplement with vitamin C.  I discussed that with a history of GI bleeding and being on a blood thinner, the patient will likely have recurrent bouts of anemia.     We will see her back in 3 months for labs and repeat blood work. Of course, if she has worsening symptoms of anemia to call sooner for repeat labs and close monitoring  The patient was advised to call immediately if she has any concerning symptoms in the interval. The patient voices understanding of current disease status and treatment options and is in agreement with the current care plan. All questions were answered. The patient knows to call the clinic with any  problems, questions or concerns. We can certainly see the patient much sooner if necessary   No orders of the defined types were placed in this encounter.    The total time spent in the appointment was 20-29 minutes  Rickardo Brinegar L Delante Karapetyan, PA-C 08/30/23

## 2023-09-03 ENCOUNTER — Other Ambulatory Visit: Payer: Self-pay | Admitting: Internal Medicine

## 2023-09-03 DIAGNOSIS — R053 Chronic cough: Secondary | ICD-10-CM

## 2023-09-04 ENCOUNTER — Inpatient Hospital Stay: Payer: Self-pay | Attending: Physician Assistant

## 2023-09-04 ENCOUNTER — Inpatient Hospital Stay (HOSPITAL_BASED_OUTPATIENT_CLINIC_OR_DEPARTMENT_OTHER): Payer: Self-pay | Admitting: Physician Assistant

## 2023-09-04 VITALS — BP 151/55 | HR 60 | Temp 97.6°F | Resp 16 | Ht 61.0 in | Wt 179.4 lb

## 2023-09-04 DIAGNOSIS — Z79899 Other long term (current) drug therapy: Secondary | ICD-10-CM | POA: Insufficient documentation

## 2023-09-04 DIAGNOSIS — D509 Iron deficiency anemia, unspecified: Secondary | ICD-10-CM | POA: Diagnosis not present

## 2023-09-04 DIAGNOSIS — D500 Iron deficiency anemia secondary to blood loss (chronic): Secondary | ICD-10-CM | POA: Diagnosis not present

## 2023-09-04 LAB — CBC WITH DIFFERENTIAL (CANCER CENTER ONLY)
Abs Immature Granulocytes: 0.02 10*3/uL (ref 0.00–0.07)
Basophils Absolute: 0 10*3/uL (ref 0.0–0.1)
Basophils Relative: 1 %
Eosinophils Absolute: 0 10*3/uL (ref 0.0–0.5)
Eosinophils Relative: 1 %
HCT: 31.7 % — ABNORMAL LOW (ref 36.0–46.0)
Hemoglobin: 9.8 g/dL — ABNORMAL LOW (ref 12.0–15.0)
Immature Granulocytes: 0 %
Lymphocytes Relative: 19 %
Lymphs Abs: 1.1 10*3/uL (ref 0.7–4.0)
MCH: 23.4 pg — ABNORMAL LOW (ref 26.0–34.0)
MCHC: 30.9 g/dL (ref 30.0–36.0)
MCV: 75.8 fL — ABNORMAL LOW (ref 80.0–100.0)
Monocytes Absolute: 0.6 10*3/uL (ref 0.1–1.0)
Monocytes Relative: 10 %
Neutro Abs: 4.1 10*3/uL (ref 1.7–7.7)
Neutrophils Relative %: 69 %
Platelet Count: 229 10*3/uL (ref 150–400)
RBC: 4.18 MIL/uL (ref 3.87–5.11)
RDW: 17.4 % — ABNORMAL HIGH (ref 11.5–15.5)
WBC Count: 5.9 10*3/uL (ref 4.0–10.5)
nRBC: 0 % (ref 0.0–0.2)

## 2023-09-04 LAB — FERRITIN: Ferritin: 18 ng/mL (ref 11–307)

## 2023-09-04 LAB — IRON AND IRON BINDING CAPACITY (CC-WL,HP ONLY)
Iron: 20 ug/dL — ABNORMAL LOW (ref 28–170)
Saturation Ratios: 5 % — ABNORMAL LOW (ref 10.4–31.8)
TIBC: 378 ug/dL (ref 250–450)
UIBC: 358 ug/dL (ref 148–442)

## 2023-09-06 ENCOUNTER — Telehealth: Payer: Self-pay | Admitting: Physician Assistant

## 2023-09-06 NOTE — Telephone Encounter (Signed)
 Called the patient to schedule out appointments. The patient is aware of the appointments made.

## 2023-09-11 ENCOUNTER — Inpatient Hospital Stay

## 2023-09-11 VITALS — BP 142/55 | HR 62 | Temp 98.2°F | Resp 18

## 2023-09-11 DIAGNOSIS — D509 Iron deficiency anemia, unspecified: Secondary | ICD-10-CM | POA: Diagnosis not present

## 2023-09-11 DIAGNOSIS — Z79899 Other long term (current) drug therapy: Secondary | ICD-10-CM | POA: Diagnosis not present

## 2023-09-11 DIAGNOSIS — D500 Iron deficiency anemia secondary to blood loss (chronic): Secondary | ICD-10-CM

## 2023-09-11 MED ORDER — ACETAMINOPHEN 325 MG PO TABS
650.0000 mg | ORAL_TABLET | Freq: Once | ORAL | Status: AC
  Administered 2023-09-11: 650 mg via ORAL
  Filled 2023-09-11: qty 2

## 2023-09-11 MED ORDER — DIPHENHYDRAMINE HCL 25 MG PO CAPS
50.0000 mg | ORAL_CAPSULE | Freq: Once | ORAL | Status: AC
  Administered 2023-09-11: 50 mg via ORAL
  Filled 2023-09-11: qty 2

## 2023-09-11 MED ORDER — SODIUM CHLORIDE 0.9 % IV SOLN
INTRAVENOUS | Status: DC
Start: 2023-09-11 — End: 2023-09-11

## 2023-09-11 MED ORDER — SODIUM CHLORIDE 0.9 % IV SOLN
300.0000 mg | Freq: Once | INTRAVENOUS | Status: AC
  Administered 2023-09-11: 300 mg via INTRAVENOUS
  Filled 2023-09-11: qty 300

## 2023-09-11 NOTE — Patient Instructions (Signed)

## 2023-09-11 NOTE — Progress Notes (Signed)
 Patient waited 30 minutes post iron infusion with no issues. Vss and ambulatory to the lobby

## 2023-09-14 NOTE — Assessment & Plan Note (Signed)
 She is encouraged to strive for BMI less than 30 to decrease cardiac risk. Advised to aim for at least 150 minutes of exercise per week.

## 2023-09-14 NOTE — Assessment & Plan Note (Signed)
 Chronic, appears stable. Importance of dietary/medication compliance was discussed with the patient. She is encouraged to gradually increase her daily activity.

## 2023-09-14 NOTE — Assessment & Plan Note (Signed)
 Chronic, currently on Eliquis due to underlying PAF.

## 2023-09-14 NOTE — Assessment & Plan Note (Signed)
 Chronic, controlled. EKG performed, SB w/o acute changes.  She will continue with current meds including carvedilol 12.5mg  twice daily, isosorbide mononitrate 30mg  daily, furosemide 80mg  twice daily and hydralazine 100mg  po tid Importance of dietary compliance was discussed with the patient.

## 2023-09-14 NOTE — Assessment & Plan Note (Signed)
 Chronic, sx currently stable on sertraline and lamotrigine.

## 2023-09-14 NOTE — Assessment & Plan Note (Signed)
 Chronic, diabetic foot exam was performed.  She is currently on semaglutide 1mg  weekly. She is encouraged to gradually increase her daily activity. She will f/u in four months. I DISCUSSED WITH THE PATIENT AT LENGTH REGARDING THE GOALS OF GLYCEMIC CONTROL AND POSSIBLE LONG-TERM COMPLICATIONS.  I  ALSO STRESSED THE IMPORTANCE OF COMPLIANCE WITH HOME GLUCOSE MONITORING, DIETARY RESTRICTIONS INCLUDING AVOIDANCE OF SUGARY DRINKS/PROCESSED FOODS,  ALONG WITH REGULAR EXERCISE.  I  ALSO STRESSED THE IMPORTANCE OF ANNUAL EYE EXAMS, SELF FOOT CARE AND COMPLIANCE WITH OFFICE VISITS.

## 2023-09-14 NOTE — Assessment & Plan Note (Signed)
 Cardiomems was placed in 5/21.

## 2023-09-14 NOTE — Assessment & Plan Note (Signed)
 Chronic, currently on Eliquis for anticoagulation. She is rate controlled on carvedilol.

## 2023-09-14 NOTE — Assessment & Plan Note (Addendum)
 Chronic, as per Podiatry. Encouraged to follow anti-inflammatory diet. She was referred to Rheum by Podiatry in 2021.

## 2023-09-18 ENCOUNTER — Inpatient Hospital Stay

## 2023-09-18 ENCOUNTER — Other Ambulatory Visit: Payer: Self-pay | Admitting: Internal Medicine

## 2023-09-18 VITALS — BP 115/53 | HR 62 | Temp 97.9°F | Resp 18

## 2023-09-18 DIAGNOSIS — D509 Iron deficiency anemia, unspecified: Secondary | ICD-10-CM | POA: Diagnosis not present

## 2023-09-18 DIAGNOSIS — Z79899 Other long term (current) drug therapy: Secondary | ICD-10-CM | POA: Diagnosis not present

## 2023-09-18 DIAGNOSIS — D500 Iron deficiency anemia secondary to blood loss (chronic): Secondary | ICD-10-CM

## 2023-09-18 MED ORDER — DIPHENHYDRAMINE HCL 25 MG PO CAPS
50.0000 mg | ORAL_CAPSULE | Freq: Once | ORAL | Status: AC
  Administered 2023-09-18: 50 mg via ORAL
  Filled 2023-09-18: qty 2

## 2023-09-18 MED ORDER — SODIUM CHLORIDE 0.9 % IV SOLN: INTRAVENOUS | Status: DC

## 2023-09-18 MED ORDER — SODIUM CHLORIDE 0.9 % IV SOLN
300.0000 mg | Freq: Once | INTRAVENOUS | Status: AC
  Administered 2023-09-18: 300 mg via INTRAVENOUS
  Filled 2023-09-18: qty 300

## 2023-09-18 MED ORDER — ACETAMINOPHEN 325 MG PO TABS
650.0000 mg | ORAL_TABLET | Freq: Once | ORAL | Status: AC
  Administered 2023-09-18: 650 mg via ORAL
  Filled 2023-09-18: qty 2

## 2023-09-18 NOTE — Patient Instructions (Signed)

## 2023-09-18 NOTE — Progress Notes (Signed)
 Pt observed for 30 minutes post Venofer infusion. Tolerated well. VSS and no complaints at time of discharge.

## 2023-09-23 DIAGNOSIS — G4733 Obstructive sleep apnea (adult) (pediatric): Secondary | ICD-10-CM | POA: Diagnosis not present

## 2023-09-25 ENCOUNTER — Other Ambulatory Visit: Payer: Self-pay | Admitting: Internal Medicine

## 2023-09-25 ENCOUNTER — Inpatient Hospital Stay: Attending: Physician Assistant

## 2023-09-25 VITALS — BP 135/65 | HR 58 | Temp 98.0°F | Resp 16

## 2023-09-25 DIAGNOSIS — D500 Iron deficiency anemia secondary to blood loss (chronic): Secondary | ICD-10-CM

## 2023-09-25 DIAGNOSIS — D509 Iron deficiency anemia, unspecified: Secondary | ICD-10-CM | POA: Diagnosis not present

## 2023-09-25 MED ORDER — ACETAMINOPHEN 325 MG PO TABS
650.0000 mg | ORAL_TABLET | Freq: Once | ORAL | Status: AC
  Administered 2023-09-25: 650 mg via ORAL
  Filled 2023-09-25: qty 2

## 2023-09-25 MED ORDER — SODIUM CHLORIDE 0.9 % IV SOLN
INTRAVENOUS | Status: DC
Start: 2023-09-25 — End: 2023-09-25

## 2023-09-25 MED ORDER — DIPHENHYDRAMINE HCL 25 MG PO CAPS
50.0000 mg | ORAL_CAPSULE | Freq: Once | ORAL | Status: AC
  Administered 2023-09-25: 50 mg via ORAL
  Filled 2023-09-25: qty 2

## 2023-09-25 MED ORDER — ZEGALOGUE 0.6 MG/0.6ML ~~LOC~~ SOAJ: 0.6000 mg | Freq: Every day | SUBCUTANEOUS | 3 refills | Status: AC | PRN

## 2023-09-25 MED ORDER — SODIUM CHLORIDE 0.9 % IV SOLN
300.0000 mg | Freq: Once | INTRAVENOUS | Status: AC
  Administered 2023-09-25: 300 mg via INTRAVENOUS
  Filled 2023-09-25: qty 300

## 2023-09-25 NOTE — Patient Instructions (Signed)

## 2023-09-25 NOTE — Progress Notes (Signed)
 Pt observed for 30 min post venofer infusion. Tolerated well. No adverse s/s. VSS at time of d/c. AVS reviewed. Pt ambulatory to lobby for d/c.

## 2023-09-27 DIAGNOSIS — Z1231 Encounter for screening mammogram for malignant neoplasm of breast: Secondary | ICD-10-CM | POA: Diagnosis not present

## 2023-09-27 LAB — HM MAMMOGRAPHY

## 2023-10-14 ENCOUNTER — Other Ambulatory Visit: Payer: Self-pay | Admitting: Internal Medicine

## 2023-10-23 ENCOUNTER — Emergency Department (HOSPITAL_COMMUNITY)
Admission: EM | Admit: 2023-10-23 | Discharge: 2023-10-24 | Disposition: A | Discharge status: Home / Self Care | Attending: Emergency Medicine | Admitting: Emergency Medicine

## 2023-10-23 ENCOUNTER — Other Ambulatory Visit: Payer: Self-pay

## 2023-10-23 ENCOUNTER — Encounter (HOSPITAL_COMMUNITY): Payer: Self-pay

## 2023-10-23 DIAGNOSIS — G40909 Epilepsy, unspecified, not intractable, without status epilepticus: Secondary | ICD-10-CM | POA: Diagnosis not present

## 2023-10-23 DIAGNOSIS — Z7984 Long term (current) use of oral hypoglycemic drugs: Secondary | ICD-10-CM | POA: Diagnosis not present

## 2023-10-23 DIAGNOSIS — E876 Hypokalemia: Secondary | ICD-10-CM | POA: Insufficient documentation

## 2023-10-23 DIAGNOSIS — Z7901 Long term (current) use of anticoagulants: Secondary | ICD-10-CM | POA: Insufficient documentation

## 2023-10-23 DIAGNOSIS — R55 Syncope and collapse: Secondary | ICD-10-CM | POA: Diagnosis not present

## 2023-10-23 DIAGNOSIS — E162 Hypoglycemia, unspecified: Secondary | ICD-10-CM | POA: Diagnosis not present

## 2023-10-23 DIAGNOSIS — W19XXXA Unspecified fall, initial encounter: Secondary | ICD-10-CM | POA: Diagnosis not present

## 2023-10-23 DIAGNOSIS — E11649 Type 2 diabetes mellitus with hypoglycemia without coma: Secondary | ICD-10-CM | POA: Diagnosis not present

## 2023-10-23 DIAGNOSIS — Z794 Long term (current) use of insulin: Secondary | ICD-10-CM | POA: Insufficient documentation

## 2023-10-23 DIAGNOSIS — M542 Cervicalgia: Secondary | ICD-10-CM | POA: Diagnosis not present

## 2023-10-23 DIAGNOSIS — R569 Unspecified convulsions: Secondary | ICD-10-CM | POA: Insufficient documentation

## 2023-10-23 DIAGNOSIS — G4733 Obstructive sleep apnea (adult) (pediatric): Secondary | ICD-10-CM | POA: Diagnosis not present

## 2023-10-23 LAB — CBG MONITORING, ED: Glucose-Capillary: 134 mg/dL — ABNORMAL HIGH (ref 70–99)

## 2023-10-23 MED ORDER — LEVETIRACETAM IN NACL 1000 MG/100ML IV SOLN
1000.0000 mg | Freq: Once | INTRAVENOUS | Status: AC
  Administered 2023-10-24: 1000 mg via INTRAVENOUS
  Filled 2023-10-23: qty 100

## 2023-10-23 NOTE — ED Triage Notes (Signed)
 Pt arrived from home via [facility] s/p x 2 20 sec seizures. EMS originally called out for hypoglycemia. EMS on scene cbg 55 gave d10 cbg per ems now 255

## 2023-10-24 ENCOUNTER — Other Ambulatory Visit: Payer: Self-pay | Admitting: Internal Medicine

## 2023-10-24 LAB — URINALYSIS, ROUTINE W REFLEX MICROSCOPIC
Bilirubin Urine: NEGATIVE
Glucose, UA: NEGATIVE mg/dL
Hgb urine dipstick: NEGATIVE
Ketones, ur: NEGATIVE mg/dL
Leukocytes,Ua: NEGATIVE
Nitrite: NEGATIVE
Protein, ur: NEGATIVE mg/dL
Specific Gravity, Urine: 1.008 (ref 1.005–1.030)
pH: 6 (ref 5.0–8.0)

## 2023-10-24 LAB — COMPREHENSIVE METABOLIC PANEL WITH GFR
ALT: 27 U/L (ref 0–44)
AST: 38 U/L (ref 15–41)
Albumin: 3.6 g/dL (ref 3.5–5.0)
Alkaline Phosphatase: 99 U/L (ref 38–126)
Anion gap: 10 (ref 5–15)
BUN: 13 mg/dL (ref 8–23)
CO2: 24 mmol/L (ref 22–32)
Calcium: 8.8 mg/dL — ABNORMAL LOW (ref 8.9–10.3)
Chloride: 101 mmol/L (ref 98–111)
Creatinine, Ser: 1.39 mg/dL — ABNORMAL HIGH (ref 0.44–1.00)
GFR, Estimated: 40 mL/min — ABNORMAL LOW (ref >60)
Glucose, Bld: 127 mg/dL — ABNORMAL HIGH (ref 70–99)
Potassium: 2.8 mmol/L — ABNORMAL LOW (ref 3.5–5.1)
Sodium: 135 mmol/L (ref 135–145)
Total Bilirubin: 0.5 mg/dL (ref 0.0–1.2)
Total Protein: 7.1 g/dL (ref 6.5–8.1)

## 2023-10-24 LAB — CBC WITH DIFFERENTIAL/PLATELET
Abs Immature Granulocytes: 0 10*3/uL (ref 0.00–0.07)
Basophils Absolute: 0 10*3/uL (ref 0.0–0.1)
Basophils Relative: 1 %
Eosinophils Absolute: 0 10*3/uL (ref 0.0–0.5)
Eosinophils Relative: 1 %
HCT: 34.7 % — ABNORMAL LOW (ref 36.0–46.0)
Hemoglobin: 10.7 g/dL — ABNORMAL LOW (ref 12.0–15.0)
Immature Granulocytes: 0 %
Lymphocytes Relative: 25 %
Lymphs Abs: 1.2 10*3/uL (ref 0.7–4.0)
MCH: 24.6 pg — ABNORMAL LOW (ref 26.0–34.0)
MCHC: 30.8 g/dL (ref 30.0–36.0)
MCV: 79.8 fL — ABNORMAL LOW (ref 80.0–100.0)
Monocytes Absolute: 0.6 10*3/uL (ref 0.1–1.0)
Monocytes Relative: 13 %
Neutro Abs: 2.8 10*3/uL (ref 1.7–7.7)
Neutrophils Relative %: 60 %
Platelets: 256 10*3/uL (ref 150–400)
RBC: 4.35 MIL/uL (ref 3.87–5.11)
RDW: 17.8 % — ABNORMAL HIGH (ref 11.5–15.5)
WBC: 4.6 10*3/uL (ref 4.0–10.5)
nRBC: 0 % (ref 0.0–0.2)

## 2023-10-24 LAB — CBG MONITORING, ED: Glucose-Capillary: 103 mg/dL — ABNORMAL HIGH (ref 70–99)

## 2023-10-24 MED ORDER — POTASSIUM CHLORIDE 20 MEQ PO PACK
60.0000 meq | PACK | Freq: Once | ORAL | Status: AC
  Administered 2023-10-24: 60 meq via ORAL
  Filled 2023-10-24: qty 3

## 2023-10-24 NOTE — ED Provider Notes (Incomplete)
 [place] [facility] Provider Note   CSN: [id number] Arrival date & time: 10/23/23  2329     History {Add pertinent medical, surgical, social history, OB history to HPI:1} Chief Complaint  Patient presents with  . Seizures    [name] is a 78 y.o. female.  HPI     Home Medications Prior to Admission medications   Medication Sig Start Date End Date Taking? Authorizing Provider  acetaminophen  (TYLENOL ) 500 MG tablet Take 500-1,000 mg by mouth every 4 (four) hours as needed for moderate pain or mild pain.    [provider]  albuterol  (VENTOLIN  HFA) 108 (90 Base) MCG/ACT inhaler Inhale 1-2 puffs into the lungs every 6 (six) hours as needed for wheezing or shortness of breath. 05/22/23   Cleave Curling, MD  Alcaftadine (LASTACAFT) 0.25 % SOLN Place 1-2 drops into both eyes daily as needed (allergy eye/irritation.).    [provider]  Alcohol  Swabs (DROPSAFE ALCOHOL  PREP) 70 % PADS USE TWO TIMES DAILY AS DIRECTED 08/20/23   Cleave Curling, MD  amiodarone  (PACERONE ) 100 MG tablet TAKE 1 TABLET EVERY DAY 05/22/23   Milford, Arlice Bene, FNP  apixaban  (ELIQUIS ) 5 MG TABS tablet Take 1 tablet (5 mg total) by mouth 2 (two) times daily. 07/22/23   Milford, Arlice Bene, FNP  Ascorbic Acid (VITAMIN C) 1000 MG tablet Take 1,000 mg by mouth in the morning.    [provider]  atorvastatin  (LIPITOR) 40 MG tablet TAKE 1 TABLET EVERY DAY 09/18/23   Cleave Curling, MD  B-D ULTRAFINE III SHORT PEN 31G X 8 MM MISC USE DAILY WITH VICTOZA  AND/OR NOVOLOG . 09/25/21   Cleave Curling, MD  benzonatate  (TESSALON ) 100 MG capsule Take 1 capsule by mouth three times daily as needed for cough 09/03/23   Cleave Curling, MD  Blood Glucose Monitoring Suppl (TRUE METRIX AIR GLUCOSE METER) w/Device KIT USE AS DIRECTED 05/27/23   Cleave Curling, MD  carvedilol  (COREG ) 12.5 MG tablet TAKE 1 TABLET TWICE DAILY 06/24/23   McLean, Dalton S, MD  Dasiglucagon  HCl  (ZEGALOGUE ) 0.6 MG/0.6ML SOAJ Inject 0.6 mg into the skin daily as needed (use for extremely low blood sugars, less than 60). 09/25/23   Cleave Curling, MD  diclofenac  sodium (VOLTAREN ) 1 % GEL Apply 2 g topically 4 (four) times daily as needed (pain). 05/17/18   Vann, Jessica U, DO  FeFum-FePoly-FA-B Cmp-C-Biot (INTEGRA PLUS ) CAPS Take 1 capsule by mouth every morning. 10/22/22   Heilingoetter, Cassandra L, PA-C  ferrous sulfate  324 MG TBEC Take 324 mg by mouth daily with breakfast.    [provider]  furosemide  (LASIX ) 20 MG tablet Take 4 tablets (80 mg total) by mouth twice a day. 07/22/23   Milford, Arlice Bene, FNP  guaiFENesin -dextromethorphan  (ROBITUSSIN DM) 100-10 MG/5ML syrup Take 10 mLs by mouth every 6 (six) hours as needed for cough. 11/18/20   [place], Rema Care, DO  hydrALAZINE  (APRESOLINE ) 100 MG tablet Take 1 tablet (100 mg total) by mouth 3 (three) times daily. 05/21/23   McLean, Dalton S, MD  hydrocortisone cream 1 % Apply 1 application topically daily as needed for itching.     [provider]  Insulin  Glargine (TOUJEO  SOLOSTAR [place]) Inject 20 Units into the skin at bedtime.    [provider]  insulin  lispro (HUMALOG  KWIKPEN) 100 UNIT/ML KwikPen Inject 0-10 Units into the skin See admin instructions. Sliding scale:Inject  (high blood sugar over 150). 150-199=2 units;200-249=4 units,250-299=6 units;300-349=8 units;350 above 10  units: Over 350 call physician 04/05/23   Cleave Curling, MD  Isopropyl Alcohol  (ALCOHOL  WIPES) 70 % MISC Apply 1 each topically 2 (two) times daily. 06/25/19   Moore, Janece, FNP  isosorbide  mononitrate (IMDUR ) 30 MG 24 hr tablet TAKE 1 TABLET EVERY DAY 08/06/23   McDaniel, Jill D, NP  lamoTRIgine  (LAMICTAL ) 200 MG tablet Take 1 tablet (200 mg total) by mouth 2 (two) times daily. 05/16/23   Jhonny Moss, MD  levETIRAcetam  (KEPPRA ) 500 MG tablet Take 2 tablets every night 05/16/23   Aquino, Karen M, MD  loratadine  (CLARITIN ) 10 MG tablet Take  1 tablet (10 mg total) by mouth daily. 10/01/22 10/01/23  Cleave Curling, MD  oxyCODONE  (OXY IR/ROXICODONE ) 5 MG immediate release tablet Take 5 mg by mouth every 6 (six) hours as needed (pain.). 11/01/22   [provider]  OZEMPIC , 1 MG/DOSE, 4 MG/3ML SOPN INJECT 1MG  UNDER THE SKIN ONE TIME WEEKLY ON [facility] 10/14/23   Cleave Curling, MD  Pancrelipase , Lip-Prot-Amyl, 25000-79000 units CPEP Take 2 capsules by mouth in the morning, at noon, and at bedtime.    [provider]  potassium chloride  SA (KLOR-CON  M) 20 MEQ tablet Take 3 tablets (60 mEq total) by mouth daily. 06/10/23   Darlis Eisenmenger, MD  sennosides-docusate sodium  (SENOKOT-S) 8.6-50 MG tablet Take 1-2 tablets by mouth at bedtime as needed for constipation.    [provider]  sertraline  (ZOLOFT ) 50 MG tablet TAKE 1 TABLET EVERY DAY 10/22/22   Cleave Curling, MD  tiZANidine  (ZANAFLEX ) 2 MG tablet TAKE 1 TABLET AT BEDTIME AS NEEDED 05/27/23   Cleave Curling, MD  tolnaftate (TINACTIN) 1 % cream Apply 1 application topically daily as needed (foot fungus).     [provider]  TRUE METRIX BLOOD GLUCOSE TEST test strip USE AS DIRECTED TO CHECK BLOOD SUGARS 2 TIMES PER DAY 05/27/23   Cleave Curling, MD  TRUEplus Lancets 33G MISC TEST BLOOD SUGAR TWICE DAILY AS DIRECTED 11/11/21   Cleave Curling, MD      Allergies    Other, Penicillins, Sulfa antibiotics, Aspirin , and Codeine    Review of Systems   Review of Systems  Physical Exam Updated Vital Signs BP (!) 157/73 (BP Location: Right Arm)   Pulse (!) 59   Temp 98.6 F (37 C) (Oral)   Resp (!) 21   Ht 5\' 1"  (1.549 m)   Wt 80.3 kg   SpO2 100%   BMI 33.44 kg/m  Physical Exam  ED Results / Procedures / Treatments   Labs (all labs ordered are listed, but only abnormal results are displayed) Labs Reviewed  CBG MONITORING, ED - Abnormal; Notable for the following components:      Result Value   Glucose-Capillary 134 (*)    All other components within  normal limits  LEVETIRACETAM  LEVEL  CBG MONITORING, ED    EKG None  Radiology No results found.  Procedures Procedures  {Document cardiac monitor, telemetry assessment procedure when appropriate:1}  Medications Ordered in ED Medications  levETIRAcetam  (KEPPRA ) IVPB 1000 mg/100 mL premix (has no administration in time range)    ED Course/ Medical Decision Making/ A&P   {   Click here for ABCD2, HEART and other calculatorsREFRESH Note before signing :1}                              Medical Decision Making Amount and/or Complexity of Data Reviewed Labs: ordered.  Risk Prescription  drug management.   ***  {Document critical care time when appropriate:1} {Document review of labs and clinical decision tools ie heart score, Chads2Vasc2 etc:1}  {Document your independent review of radiology images, and any outside records:1} {Document your discussion with family members, caretakers, and with consultants:1} {Document social determinants of health affecting pt's care:1} {Document your decision making why or why not admission, treatments were needed:1} Final Clinical Impression(s) / ED Diagnoses Final diagnoses:  None    Rx / DC Orders ED Discharge Orders     None

## 2023-10-24 NOTE — ED Notes (Signed)
 Please give an update to sister [phone] Adell Hones

## 2023-10-24 NOTE — ED Provider Notes (Signed)
 [place] [facility] AT [facility] Provider Note   CSN: [id number] Arrival date & time: 10/23/23  2329     History  Chief Complaint  Patient presents with   Seizures    [name] is a 78 y.o. female.  Patient presents for evaluation of likely seizure.  Patient reports that she started to feel funny and checked her blood sugar.  She reports that it was in the 30s.  Her sister gave her sugar.  That seems to be the last thing she remembers.  EMS was called, she reportedly had a generalized tonic clonic seizure.  Patient does report a history of seizures.  She has been taking all of her medications as prescribed.  Patient does report that she has a left lower molar toothache.  No facial swelling or fever.  She has a dental appointment in the morning.       Home Medications Prior to Admission medications   Medication Sig Start Date End Date Taking? Authorizing Provider  acetaminophen  (TYLENOL ) 500 MG tablet Take 500-1,000 mg by mouth every 4 (four) hours as needed for moderate pain or mild pain.    [provider]  albuterol  (VENTOLIN  HFA) 108 (90 Base) MCG/ACT inhaler Inhale 1-2 puffs into the lungs every 6 (six) hours as needed for wheezing or shortness of breath. 05/22/23   Cleave Curling, MD  Alcaftadine (LASTACAFT) 0.25 % SOLN Place 1-2 drops into both eyes daily as needed (allergy eye/irritation.).    [provider]  Alcohol  Swabs (DROPSAFE ALCOHOL  PREP) 70 % PADS USE TWO TIMES DAILY AS DIRECTED 08/20/23   Cleave Curling, MD  amiodarone  (PACERONE ) 100 MG tablet TAKE 1 TABLET EVERY DAY 05/22/23   Milford, Arlice Bene, FNP  apixaban  (ELIQUIS ) 5 MG TABS tablet Take 1 tablet (5 mg total) by mouth 2 (two) times daily. 07/22/23   Milford, Arlice Bene, FNP  Ascorbic Acid (VITAMIN C) 1000 MG tablet Take 1,000 mg by mouth in the morning.    [provider]  atorvastatin  (LIPITOR) 40 MG tablet TAKE 1 TABLET EVERY DAY 09/18/23   Cleave Curling,  MD  B-D ULTRAFINE III SHORT PEN 31G X 8 MM MISC USE DAILY WITH VICTOZA  AND/OR NOVOLOG . 09/25/21   Cleave Curling, MD  benzonatate  (TESSALON ) 100 MG capsule Take 1 capsule by mouth three times daily as needed for cough 09/03/23   Cleave Curling, MD  Blood Glucose Monitoring Suppl (TRUE METRIX AIR GLUCOSE METER) w/Device KIT USE AS DIRECTED 05/27/23   Cleave Curling, MD  carvedilol  (COREG ) 12.5 MG tablet TAKE 1 TABLET TWICE DAILY 06/24/23   McLean, Dalton S, MD  Dasiglucagon  HCl (ZEGALOGUE ) 0.6 MG/0.6ML SOAJ Inject 0.6 mg into the skin daily as needed (use for extremely low blood sugars, less than 60). 09/25/23   Cleave Curling, MD  diclofenac  sodium (VOLTAREN ) 1 % GEL Apply 2 g topically 4 (four) times daily as needed (pain). 05/17/18   Vann, Jessica U, DO  FeFum-FePoly-FA-B Cmp-C-Biot (INTEGRA PLUS ) CAPS Take 1 capsule by mouth every morning. 10/22/22   Heilingoetter, Cassandra L, PA-C  ferrous sulfate  324 MG TBEC Take 324 mg by mouth daily with breakfast.    [provider]  furosemide  (LASIX ) 20 MG tablet Take 4 tablets (80 mg total) by mouth twice a day. 07/22/23   Milford, Arlice Bene, FNP  guaiFENesin -dextromethorphan  (ROBITUSSIN DM) 100-10 MG/5ML syrup Take 10 mLs by mouth every 6 (six) hours as needed for cough. 11/18/20   [place], Rema Care, DO  hydrALAZINE  (  APRESOLINE ) 100 MG tablet Take 1 tablet (100 mg total) by mouth 3 (three) times daily. 05/21/23   McLean, Dalton S, MD  hydrocortisone cream 1 % Apply 1 application topically daily as needed for itching.     [provider]  Insulin  Glargine (TOUJEO  SOLOSTAR [place]) Inject 20 Units into the skin at bedtime.    [provider]  insulin  lispro (HUMALOG  KWIKPEN) 100 UNIT/ML KwikPen Inject 0-10 Units into the skin See admin instructions. Sliding scale:Inject  (high blood sugar over 150). 150-199=2 units;200-249=4 units,250-299=6 units;300-349=8 units;350 above 10 units: Over 350 call physician 04/05/23   Cleave Curling, MD  Isopropyl  Alcohol  (ALCOHOL  WIPES) 70 % MISC Apply 1 each topically 2 (two) times daily. 06/25/19   Moore, Janece, FNP  isosorbide  mononitrate (IMDUR ) 30 MG 24 hr tablet TAKE 1 TABLET EVERY DAY 08/06/23   McDaniel, Jill D, NP  lamoTRIgine  (LAMICTAL ) 200 MG tablet Take 1 tablet (200 mg total) by mouth 2 (two) times daily. 05/16/23   Jhonny Moss, MD  levETIRAcetam  (KEPPRA ) 500 MG tablet Take 2 tablets every night 05/16/23   Aquino, Karen M, MD  loratadine  (CLARITIN ) 10 MG tablet Take 1 tablet (10 mg total) by mouth daily. 10/01/22 10/01/23  Cleave Curling, MD  oxyCODONE  (OXY IR/ROXICODONE ) 5 MG immediate release tablet Take 5 mg by mouth every 6 (six) hours as needed (pain.). 11/01/22   [provider]  OZEMPIC , 1 MG/DOSE, 4 MG/3ML SOPN INJECT 1MG  UNDER THE SKIN ONE TIME WEEKLY ON [facility] 10/14/23   Cleave Curling, MD  Pancrelipase , Lip-Prot-Amyl, 25000-79000 units CPEP Take 2 capsules by mouth in the morning, at noon, and at bedtime.    [provider]  potassium chloride  SA (KLOR-CON  M) 20 MEQ tablet Take 3 tablets (60 mEq total) by mouth daily. 06/10/23   Darlis Eisenmenger, MD  sennosides-docusate sodium  (SENOKOT-S) 8.6-50 MG tablet Take 1-2 tablets by mouth at bedtime as needed for constipation.    [provider]  sertraline  (ZOLOFT ) 50 MG tablet TAKE 1 TABLET EVERY DAY 10/22/22   Cleave Curling, MD  tiZANidine  (ZANAFLEX ) 2 MG tablet TAKE 1 TABLET AT BEDTIME AS NEEDED 05/27/23   Cleave Curling, MD  tolnaftate (TINACTIN) 1 % cream Apply 1 application topically daily as needed (foot fungus).     [provider]  TRUE METRIX BLOOD GLUCOSE TEST test strip USE AS DIRECTED TO CHECK BLOOD SUGARS 2 TIMES PER DAY 05/27/23   Cleave Curling, MD  TRUEplus Lancets 33G MISC TEST BLOOD SUGAR TWICE DAILY AS DIRECTED 11/11/21   Cleave Curling, MD      Allergies    Other, Penicillins, Sulfa antibiotics, Aspirin , and Codeine    Review of Systems   Review of Systems  Physical Exam Updated  Vital Signs BP (!) 108/53   Pulse (!) 56   Temp 98.3 F (36.8 C) (Oral)   Resp 12   Ht 5\' 1"  (1.549 m)   Wt 80.3 kg   SpO2 97%   BMI 33.44 kg/m  Physical Exam Vitals and nursing note reviewed.  Constitutional:      General: She is not in acute distress.    Appearance: She is well-developed.  HENT:     Head: Normocephalic and atraumatic.     Mouth/Throat:     Mouth: Mucous membranes are moist.  Eyes:     General: Vision grossly intact. Gaze aligned appropriately.     Extraocular Movements: Extraocular movements intact.     Conjunctiva/sclera: Conjunctivae normal.  Cardiovascular:  Rate and Rhythm: Normal rate and regular rhythm.     Pulses: Normal pulses.     Heart sounds: Normal heart sounds, S1 normal and S2 normal. No murmur heard.    No friction rub. No gallop.  Pulmonary:     Effort: Pulmonary effort is normal. No respiratory distress.     Breath sounds: Normal breath sounds.  Abdominal:     General: Bowel sounds are normal.     Palpations: Abdomen is soft.     Tenderness: There is no abdominal tenderness. There is no guarding or rebound.     Hernia: No hernia is present.  Musculoskeletal:        General: No swelling.     Cervical back: Full passive range of motion without pain, normal range of motion and neck supple. No spinous process tenderness or muscular tenderness. Normal range of motion.     Right lower leg: No edema.     Left lower leg: No edema.  Skin:    General: Skin is warm and dry.     Capillary Refill: Capillary refill takes less than 2 seconds.     Findings: No ecchymosis, erythema, rash or wound.  Neurological:     General: No focal deficit present.     Mental Status: She is alert and oriented to person, place, and time.     GCS: GCS eye subscore is 4. GCS verbal subscore is 5. GCS motor subscore is 6.     Cranial Nerves: Cranial nerves 2-12 are intact.     Sensory: Sensation is intact.     Motor: Motor function is intact.     Coordination:  Coordination is intact.  Psychiatric:        Attention and Perception: Attention normal.        Mood and Affect: Mood normal.        Speech: Speech normal.        Behavior: Behavior normal.     ED Results / Procedures / Treatments   Labs (all labs ordered are listed, but only abnormal results are displayed) Labs Reviewed  CBC WITH DIFFERENTIAL/PLATELET - Abnormal; Notable for the following components:      Result Value   Hemoglobin 10.7 (*)    HCT 34.7 (*)    MCV 79.8 (*)    MCH 24.6 (*)    RDW 17.8 (*)    All other components within normal limits  COMPREHENSIVE METABOLIC PANEL WITH GFR - Abnormal; Notable for the following components:   Potassium 2.8 (*)    Glucose, Bld 127 (*)    Creatinine, Ser 1.39 (*)    Calcium  8.8 (*)    GFR, Estimated 40 (*)    All other components within normal limits  CBG MONITORING, ED - Abnormal; Notable for the following components:   Glucose-Capillary 134 (*)    All other components within normal limits  CBG MONITORING, ED - Abnormal; Notable for the following components:   Glucose-Capillary 103 (*)    All other components within normal limits  URINALYSIS, ROUTINE W REFLEX MICROSCOPIC  LEVETIRACETAM  LEVEL  CBG MONITORING, ED    EKG None  Radiology No results found.  Procedures Procedures    Medications Ordered in ED Medications  levETIRAcetam  (KEPPRA ) IVPB 1000 mg/100 mL premix (0 mg Intravenous Stopped 10/24/23 0024)  potassium chloride  (KLOR-CON ) packet 60 mEq (60 mEq Oral Given 10/24/23 0328)    ED Course/ Medical Decision Making/ A&P  Medical Decision Making Amount and/or Complexity of Data Reviewed Labs: ordered.  Risk Prescription drug management.   Differential diagnosis considered includes, but not limited to: TIA; Stroke; ICH; Seizure; electrolyte abnormality; hypoglycemia; toxic/pharmacologic causes; CNS infection; psychiatric disorder  Presents to the emergency department with  seizure.  Patient reports that she started to feel poorly and checked her sugar, blood sugar was in the 30s.  Her sister attempted to treat that sugar but the patient then had a generalized tonic-clonic seizure.  She does have a history of seizure disorder, on Lamictal  and Keppra .  She has not missed any doses.  She has been doing well otherwise, no precursor illness other than a left sided toothache.  No signs of abscess.  She reports that she has been able to eat and drink with a toothache, taking all of her medications normally.  Patient's blood sugars have remained stable here in the ED.  She was given additional Keppra .  Workup has been unremarkable otherwise.  Can follow-up with her neurologist for breakthrough seizure.  She has a dental appointment today.        Final Clinical Impression(s) / ED Diagnoses Final diagnoses:  Seizure (HCC)  Hypoglycemia    Rx / DC Orders ED Discharge Orders     None         Birdie Beveridge, Marine Sia, MD 10/24/23 [phone]

## 2023-10-25 LAB — LEVETIRACETAM LEVEL: Levetiracetam Lvl: 103.8 ug/mL — ABNORMAL HIGH (ref 10.0–40.0)

## 2023-10-28 ENCOUNTER — Telehealth: Payer: Self-pay

## 2023-10-28 NOTE — Transitions of Care (Post Inpatient/ED Visit) (Signed)
   10/28/2023  Name: [name] MRN: [id number] DOB: 03/14/1946  Today's TOC FU Call Status: Today's TOC FU Call Status:: Unsuccessful Call (1st Attempt) Unsuccessful Call (1st Attempt) Date: 10/28/23  Attempted to reach the patient regarding the most recent Inpatient/ED visit.  Follow Up Plan: Additional outreach attempts will be made to reach the patient to complete the Transitions of Care (Post Inpatient/ED visit) call.   Signature  [place]ron [place], LPN [facility] Advisor [facility] You Are. We Are. [facility] Direct Dial  [phone]

## 2023-11-05 ENCOUNTER — Inpatient Hospital Stay: Payer: Self-pay | Admitting: Internal Medicine

## 2023-11-15 ENCOUNTER — Encounter: Payer: Self-pay | Admitting: Neurology

## 2023-11-15 ENCOUNTER — Ambulatory Visit: Payer: Medicare HMO | Admitting: Neurology

## 2023-11-15 VITALS — BP 141/71 | HR 60 | Ht 61.0 in | Wt 182.0 lb

## 2023-11-15 DIAGNOSIS — G40009 Localization-related (focal) (partial) idiopathic epilepsy and epileptic syndromes with seizures of localized onset, not intractable, without status epilepticus: Secondary | ICD-10-CM

## 2023-11-15 MED ORDER — LEVETIRACETAM 500 MG PO TABS: ORAL_TABLET | ORAL | 3 refills | Status: AC

## 2023-11-15 MED ORDER — LAMOTRIGINE 200 MG PO TABS: 200.0000 mg | ORAL_TABLET | Freq: Two times a day (BID) | ORAL | 3 refills | Status: AC

## 2023-11-15 NOTE — Patient Instructions (Signed)
 Always a pleasure to see you.  Continue Lamotrigine  200mg  twice a day  2. Continue Levetiracetam  500mg : take 2 tablets every night  3. Continue to monitor glucose levels, if too low, please discuss with your endocrinologist  4. Follow-up in 6 months, call for any changes   Seizure Precautions: 1. If medication has been prescribed for you to prevent seizures, take it exactly as directed.  Do not stop taking the medicine without talking to your doctor first, even if you have not had a seizure in a long time.   2. Avoid activities in which a seizure would cause danger to yourself or to others.  Don't operate dangerous machinery, swim alone, or climb in high or dangerous places, such as on ladders, roofs, or girders.  Do not drive unless your doctor says you may.  3. If you have any warning that you may have a seizure, lay down in a safe place where you can't hurt yourself.    4.  No driving for 6 months from last seizure, as per [place]  state law.   Please refer to the following link on the [employer]'s website for more information: http://www.epilepsyfoundation.org/answerplace/Social/driving/drivingu.cfm   5.  Maintain good sleep hygiene. Avoid alcohol .  6.  Contact your doctor if you have any problems that may be related to the medicine you are taking.  7.  Call 911 and bring the patient back to the ED if:        A.  The seizure lasts longer than 5 minutes.       B.  The patient doesn't awaken shortly after the seizure  C.  The patient has new problems such as difficulty seeing, speaking or moving  D.  The patient was injured during the seizure  E.  The patient has a temperature over 102 F (39C)  F.  The patient vomited and now is having trouble breathing

## 2023-11-15 NOTE — Progress Notes (Signed)
 NEUROLOGY FOLLOW UP OFFICE NOTE  DORAL DIGANGI [id number] Jul 22, 1945  HISTORY OF PRESENT ILLNESS: I had the pleasure of seeing [name] in follow-up in the neurology clinic on 11/15/2023. She is accompanied by her son [name] who helps supplement the history today. The patient was last seen 6 months ago for seizures. She has co-existing epilepsy and non-epileptic events. Records and images were personally reviewed where available.  She was in the ER on 10/23/23 for seizure. She started to feel funny with her vision, she was sitting and it seemed like she could not get her glucometer. Glucose level was 31, she called her sister who gave her glucose, then she woke up in the hospital. No tongue bite or incontinence. Per notes, her sister attempted to treat sugar but she then had a GTC. EMS arrived she reportedly had a GTC. Her son reports she was unresponsive when he arrived and had another seizure at the bottom of the steps. She was pale in a fetal position. Per ER notes, she had 2 20 second seizures. Glucose 55 on scene, she was given D10. Keppra  level in the ER was 103.8. She is on Levetiracetam  1000mg  at bedtime and Lamotrigine  200mg  BID without side effects. She denies any sleep deprivation but wakes up with her CPAP mask off. No alcohol . She manages her own medications and denies missing doses. [name] has not seen any staring episodes, her sister has not mentioned any recently. She denies any headaches. She feels her balance is off sometimes, no falls. She reports glucose levels are better, 128 this morning. Neck is still sore, she has left sciatica.    History on Initial Assessment 12/09/2017: This is a 78 year old right-handed woman with a history of hypertension, hyperlipidemia, diabetes, sleep apnea, drug-induced parkinsonism, presenting to establish care for seizures. She had previously been seeing the Epilepsy and Movement Disorders clinic at [facility]. She has seen Dr. Winferd Hatter in our office  and Sinemet  is being weaned off. Her last visit with the Epilepsy clinic was in 2017. Records were reviewed and will be summarized for this visit. She reports the first seizure occurred in March 2009. She was driving at that time and lost consciousness without any prior warning symptoms. Family witnessed episodes of staring off and whole body jerking with foaming at the mouth. Initially she was having episodes every 3 months, then they began to cluster having up to 5 events at a time. She was monitored at [facility] in January 2011 where 3 events were captured. One epileptic event localized to the left anterior temporal region. She had 2 additional events with abnormal facial and hand movements without clear EEG changes seen. She was started on Lamotrigine  at that time. She had a 3-day ambulatory EEG that was technically limited, with note of a single left temporal spike and multiple push button events with no definite EEG correlate, although EEG was obscured by artifact during some of the events. Her sister has described her going into a trance with abnormal mouth movements (mouth moving quickly), followed by right arm shaking lasting up to 5 minutes, at times with associated urinary incontinence. The last episode was in December 2018. She would feel lost and dizzy after, no focal weakness. One time she told her sister there was a funny taste in her mouth after a seizure. She is taking Lamotrigine  100mg  BID without side effects. She is also on Lyrica  100mg  BID for back pain, no side effects.    She reports  she feels fine other than she can't get around well due to her feet issues. She has occasional sharp headaches on the left temporal region, she had one the other day lasting a few minutes with no associated nausea/vomiting. She has rare dizziness, no diplopia, dysarthria/dysphagia, neck pain, focal numbness/tingling, bladder dysfunction. She has chronic back pain and pain in both legs, she has had constipation  due to iron  supplements. She lives with her sister and does not drive.    Epilepsy Risk Factors:  Her sister thinks her mother had seizures due to episodes of right arm shaking/tremors, but as far as she knows was not on seizure medication. Otherwise she had a normal birth and early development.  There is no history of febrile convulsions, CNS infections such as meningitis/encephalitis, significant traumatic brain injury, neurosurgical procedures.   Laboratory Data: Bloodwork from 08/09/15: Lamotrigine  level 3.5 EEGs:She was monitored at [facility] in January 2011 where 3 events were captured. One epileptic event localized to the left anterior temporal region. She had 2 additional events with abnormal facial and hand movements without clear EEG changes seen. She was started on Lamotrigine  at that time. She had a 3-day ambulatory EEG that was technically limited, with note of a single left temporal spike and multiple push button events with no definite EEG correlate, although EEG was obscured by artifact during some of the events EEG in 02/2019 was unremarkable, there was note of a single sharp transient in the left frontotemporal region.  MRI: I personally reviewed MRI brain done 05/2017 which did not show any acute changes. 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 MRI brain without contrast on 02/2019 no acute changes, there was a small chronic right posterior frontal cortical infarct, mild diffuse atrophy and chronic microvascular disease. There was also note of empty sella with prominent arachnoids pits at the sphenoid wings bilaterally, which can be seen with IIH. She has no other symptoms of IIH, she denies any frequent headaches, no vision changes.   PAST MEDICAL HISTORY: Past Medical History:  Diagnosis Date   Abnormal liver function     in the past.   Anemia     Arthritis    all over   Bruises easily    Cataract    right eye;immature   CHF (congestive heart failure) (HCC)    Chronic back pain    stenosis   Chronic cough    Chronic kidney disease    COPD (chronic obstructive pulmonary disease) (HCC)    COVID    aug/sept 2021   Demand myocardial infarction [facility]) 2012   Demand Infarction in setting of Pancreatitis --> mild Troponin elevation, NON-OBSTRUCTIVE CAD   Depression    takes Abilify  daily as well as Zoloft    Diastolic heart failure 2010   Grade 1 diastolic Dysfunction by Echo    Diverticulosis    DM (diabetes mellitus) (HCC)    takes Victoza  daily as well as Lantus  and Humalog    Empty sella (HCC)    on MRI in 2009.   Glaucoma    Headache(784.0)    last migraine-4-23yrs ago   History of blood transfusion    no abnormal reaction noted   History of colon polyps    benign   HTN (hypertension)    takes Benicar ,Imdur ,and Bystolic  daily   Hyperlipidemia    takes Lipitor daily   Joint swelling  Nocturia    Obesity hypoventilation syndrome (HCC)    Obstructive sleep apnea 02/2018   Notably improved split-night study with weight loss from 270 (2017) down to 250 pounds (2019).   Pancreatitis    takes Pancrelipase  daily   Parkinson's disease (HCC)    takes Sinemet  daily   Peripheral neuropathy    Pneumonia 2012   Seizures (HCC)    takes Lamictal  daily and Primidone  nightly;last seizure 2wks ago   Urinary urgency    With increased frequency   Varicose veins of both lower extremities with pain    With edema.  Takes daily Lasix     MEDICATIONS: Current Outpatient Medications on File Prior to Visit  Medication Sig Dispense Refill   acetaminophen  (TYLENOL ) 500 MG tablet Take 500-1,000 mg by mouth every 4 (four) hours as needed for moderate pain or mild pain.     albuterol  (VENTOLIN  HFA) 108 (90 Base) MCG/ACT inhaler Inhale 1-2 puffs into the lungs every 6 (six) hours as needed for wheezing or shortness of breath. 17 each 5    Alcaftadine (LASTACAFT) 0.25 % SOLN Place 1-2 drops into both eyes daily as needed (allergy eye/irritation.).     Alcohol  Swabs (DROPSAFE ALCOHOL  PREP) 70 % PADS USE TWO TIMES DAILY AS DIRECTED 200 each 3   amiodarone  (PACERONE ) 100 MG tablet TAKE 1 TABLET EVERY DAY 90 tablet 3   apixaban  (ELIQUIS ) 5 MG TABS tablet Take 1 tablet (5 mg total) by mouth 2 (two) times daily. 180 tablet 3   Ascorbic Acid (VITAMIN C) 1000 MG tablet Take 1,000 mg by mouth in the morning.     atorvastatin  (LIPITOR) 40 MG tablet TAKE 1 TABLET EVERY DAY 90 tablet 3   B-D ULTRAFINE III SHORT PEN 31G X 8 MM MISC USE DAILY WITH VICTOZA  AND/OR NOVOLOG . 400 each 2   benzonatate  (TESSALON ) 100 MG capsule Take 1 capsule by mouth three times daily as needed for cough 30 capsule 0   Blood Glucose Monitoring Suppl (TRUE METRIX AIR GLUCOSE METER) w/Device KIT USE AS DIRECTED 1 kit 3   carvedilol  (COREG ) 12.5 MG tablet TAKE 1 TABLET TWICE DAILY 180 tablet 3   Dasiglucagon  HCl (ZEGALOGUE ) 0.6 MG/0.6ML SOAJ Inject 0.6 mg into the skin daily as needed (use for extremely low blood sugars, less than 60). 0.6 mL 3   diclofenac  sodium (VOLTAREN ) 1 % GEL Apply 2 g topically 4 (four) times daily as needed (pain).     FeFum-FePoly-FA-B Cmp-C-Biot (INTEGRA PLUS ) CAPS Take 1 capsule by mouth every morning. 30 capsule 2   ferrous sulfate  324 MG TBEC Take 324 mg by mouth daily with breakfast.     furosemide  (LASIX ) 20 MG tablet Take 4 tablets (80 mg total) by mouth twice a day. 720 tablet 3   guaiFENesin -dextromethorphan  (ROBITUSSIN DM) 100-10 MG/5ML syrup Take 10 mLs by mouth every 6 (six) hours as needed for cough. 118 mL 0   hydrALAZINE  (APRESOLINE ) 100 MG tablet Take 1 tablet (100 mg total) by mouth 3 (three) times daily. 270 tablet 3   hydrocortisone cream 1 % Apply 1 application topically daily as needed for itching.      insulin  glargine, 1 Unit Dial , (TOUJEO  SOLOSTAR) 300 UNIT/ML Solostar Pen INJECT 36 UNITS UNDER THE SKIN AT BEDTIME  (Patient taking differently: 20 Units.) 9 mL 3   insulin  lispro (HUMALOG  KWIKPEN) 100 UNIT/ML KwikPen Inject 0-10 Units into the skin See admin instructions. Sliding scale:Inject  (high blood sugar over 150). 150-199=2 units;200-249=4 units,250-299=6 units;300-349=8 units;350 above  10 units: Over 350 call physician 15 mL 0   Isopropyl Alcohol  (ALCOHOL  WIPES) 70 % MISC Apply 1 each topically 2 (two) times daily. 300 each 3   isosorbide  mononitrate (IMDUR ) 30 MG 24 hr tablet TAKE 1 TABLET EVERY DAY 90 tablet 2   lamoTRIgine  (LAMICTAL ) 200 MG tablet Take 1 tablet (200 mg total) by mouth 2 (two) times daily. 180 tablet 3   levETIRAcetam  (KEPPRA ) 500 MG tablet Take 2 tablets every night 180 tablet 3   loratadine  (CLARITIN ) 10 MG tablet Take 1 tablet (10 mg total) by mouth daily. 30 tablet 2   Multiple Vitamin (MULTIVITAMIN) tablet Take 1 tablet by mouth daily.     omeprazole (PRILOSEC) 10 MG capsule Take 10 mg by mouth every Monday, Wednesday, and Friday.     oxyCODONE  (OXY IR/ROXICODONE ) 5 MG immediate release tablet Take 5 mg by mouth every 6 (six) hours as needed (pain.).     OZEMPIC , 1 MG/DOSE, 4 MG/3ML SOPN INJECT 1MG  UNDER THE SKIN ONE TIME WEEKLY ON WEDNESDAYS 9 mL 3   Pancrelipase , Lip-Prot-Amyl, 25000-79000 units CPEP Take 2 capsules by mouth in the morning, at noon, and at bedtime.     potassium chloride  SA (KLOR-CON  M) 20 MEQ tablet Take 3 tablets (60 mEq total) by mouth daily. 300 tablet 3   sennosides-docusate sodium  (SENOKOT-S) 8.6-50 MG tablet Take 1-2 tablets by mouth at bedtime as needed for constipation.     sertraline  (ZOLOFT ) 50 MG tablet TAKE 1 TABLET EVERY DAY 90 tablet 3   tiZANidine  (ZANAFLEX ) 2 MG tablet TAKE 1 TABLET AT BEDTIME AS NEEDED 30 tablet 11   tolnaftate (TINACTIN) 1 % cream Apply 1 application topically daily as needed (foot fungus).      TRUE METRIX BLOOD GLUCOSE TEST test strip USE AS DIRECTED TO CHECK BLOOD SUGARS 2 TIMES PER DAY 200 strip 3   TRUEplus Lancets 33G  MISC TEST BLOOD SUGAR TWICE DAILY AS DIRECTED 200 each 3   Current Facility-Administered Medications on File Prior to Visit  Medication Dose Route Frequency Provider Last Rate Last Admin   0.9 %  sodium chloride  infusion (Manually program via Guardrails IV Fluids)   Intravenous Once B[place], Jessica M, FNP       0.9 %  sodium chloride  infusion (Manually program via Guardrails IV Fluids)   Intravenous Once [place], Jessica M, FNP        ALLERGIES: Allergies  Allergen Reactions   Other Anaphylaxis and Rash    Bleach Cigarette smoke/ Cough   Penicillins Hives    Did it involve swelling of the face/tongue/throat, SOB, or low BP? No Did it involve sudden or severe rash/hives, skin peeling, or any reaction on the inside of your mouth or nose? Yes Did you need to seek medical attention at a hospital or doctor's office? Yes When did it last happen?      Childhood allergy  If all above answers are "NO", may proceed with cephalosporin use.     Sulfa Antibiotics Hives   Aspirin  Other (See Comments)     On aspirin  81 mg - Rectal bleeding in dec 2018   Codeine     Headache and makes the patient feel "off"    FAMILY HISTORY: Family History  Problem Relation Age of Onset   Allergies Father    Heart disease Father        before 80   Heart failure Father    Hypertension Father    Hyperlipidemia Father    Heart  disease Mother    Diabetes Mother    Hypertension Mother    Hyperlipidemia Mother    Hypertension Sister    Heart disease Sister        before 55   Hyperlipidemia Sister    Diabetes Brother    Hypertension Brother    Diabetes Sister    Hypertension Sister    Diabetes Son    Hypertension Son    Cancer Other     SOCIAL HISTORY: Social History   Socioeconomic History   Marital status: Divorced    Spouse name: Not on file   Number of children: Not on file   Years of education: Not on file   Highest education level: Not on file  Occupational History   Occupation:  Disabled    Comment: CNA  Tobacco Use   Smoking status: Former    Current packs/day: 0.50    Average packs/day: 0.5 packs/day for 25.0 years (12.5 ttl pk-yrs)    Types: Cigarettes   Smokeless tobacco: Never   Tobacco comments:    quit smoking 20+ytrs ago  Vaping Use   Vaping status: Never Used  Substance and Sexual Activity   Alcohol  use: No   Drug use: No   Sexual activity: Not Currently    Birth control/protection: Surgical  Other Topics Concern   Not on file  Social History Narrative   She lives in [place]. She has lots of family in the area and is accompanied by her sister.   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   She has a distant history of smoking, quit 20 years ago.   Right handed    Social Drivers of Health   Financial Resource Strain: Low Risk  (07/18/2022)   Overall Financial Resource Strain (CARDIA)    Difficulty of Paying Living Expenses: Not hard at all  Food Insecurity: No Food Insecurity (07/18/2022)   Hunger Vital Sign    Worried About Running Out of Food in the Last Year: Never true    Ran Out of Food in the Last Year: Never true  Transportation Needs: Unmet Transportation Needs (11/28/2022)   PRAPARE - Transportation    Lack of Transportation (Medical): Yes    Lack of Transportation (Non-Medical): Yes  Physical Activity: Inactive (07/18/2022)   Exercise Vital Sign    Days of Exercise per Week: 0 days    Minutes of Exercise per Session: 0 min  Stress: No Stress Concern Present (07/18/2022)   [employer] of Occupational Health - Occupational Stress Questionnaire    Feeling of Stress : Not at all  Social Connections: Not on file  Intimate Partner Violence: Not At Risk (10/22/2018)   Humiliation, Afraid, Rape, and Kick questionnaire    Fear of Current or Ex-Partner: No    Emotionally Abused: No    Physically Abused: No    Sexually Abused: No     PHYSICAL EXAM: Vitals:   11/15/23 1104  BP: (!) 141/71   Pulse: 60  SpO2: 97%   General: No acute distress Head:  Normocephalic/atraumatic Skin/Extremities: No rash, no edema Neurological Exam: alert and awake. No aphasia or dysarthria. Fund of knowledge is appropriate.  Attention and concentration are normal.   Cranial nerves: Pupils equal, round. Extraocular movements intact with no nystagmus. Visual fields full.  No facial asymmetry.  Motor: Bulk and tone normal, muscle strength 5/5 throughout with no pronator drift.   Finger to nose testing intact.  Gait slow and cautious with cane,  no ataxia. No tremors.    IMPRESSION: This is a 78 yo RH woman with a history of hypertension, hyperlipidemia, diabetes, sleep apnea, drug-induced parkinsonism, and seizures. She has had evaluation at [facility] with video EEG capturing one epileptic seizure arising from the left anterior temporal region. She has also had several episodes captured on inpatient and ambulatory EEG with no EEG correlate. 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 She had 2 convulsions on 10/23/23 in the setting of hypoglycemia (BG 31). We agreed to continue on current ASMs, Lamotrigine  200mg  BID and Levetiracetam  1000mg  at bedtime. Continue glucose monitoring/control. She does not drive. Follow-up in 6 months, call for any changes.   Thank you for allowing me to participate in her care.  Please do not hesitate to call for any questions or concerns.    Rayfield Cairo, M.D.   CC: Dr. Elnita Hai

## 2023-11-23 DIAGNOSIS — G4733 Obstructive sleep apnea (adult) (pediatric): Secondary | ICD-10-CM | POA: Diagnosis not present

## 2023-12-02 NOTE — Progress Notes (Unsigned)
 [facility] OFFICE PROGRESS NOTE  Cleave Curling, MD [address] [place] [address]  DIAGNOSIS:  Iron  deficiency anemia   PRIOR THERAPY: None  CURRENT THERAPY:  1) oral iron  supplement OTC tablet p.o. daily 2) IV iron  as needed most recent with venofer  09/25/23  INTERVAL HISTORY: [name] 78 y.o. female returns to the clinic today for a follow-up visit.  The patient was last seen in the clinic by myself on 09/04/23.   The patient is followed for her history of iron  deficiency anemia.  Her most recent IV iron  infusion was with venofer  on 09/25/23. She is taking over-the-counter iron  supplements as Integra plus  was too expensive for her. It does cause mild nausea and she takes medication for constipation. She does say she is compliant and takes it every day.   In the interval since last being seen, she was seeni n the ER and follows with neurology for seizures which was prompted by a hypoglycemic episode per patient report.   Her energy comes and goes. She has a history of GI bleeding and follows with Dr. Kimble Pennant.  She thinks she has an appointment next month. She denies any significant visible epistaxis, hemoptysis or hematemesis. She does report her stools are always dark but she is not sure if this is from the iron  supplement or not. She did have a day where she did have some gum bleeding after having a tooth pulled. The patient does take Eliquis .  Denies any NSAID use. She is not a vegan or vegetarian, although she does not eat a lot of red meat due to preference.  No history of bariatric surgery.  She does follow closely with pulmonary medicine for pulmonary fibrosis and chronic cough. Her shortness of breath with exertion is the same.  The patient is here today for evaluation repeat blood work.    MEDICAL HISTORY: Past Medical History:  Diagnosis Date   Abnormal liver function     in the past.   Anemia    Arthritis    all over   Bruises easily     Cataract    right eye;immature   CHF (congestive heart failure) (HCC)    Chronic back pain    stenosis   Chronic cough    Chronic kidney disease    COPD (chronic obstructive pulmonary disease) (HCC)    COVID    aug/sept 2021   Demand myocardial infarction [facility]) 2012   Demand Infarction in setting of Pancreatitis --> mild Troponin elevation, NON-OBSTRUCTIVE CAD   Depression    takes Abilify  daily as well as Zoloft    Diastolic heart failure 2010   Grade 1 diastolic Dysfunction by Echo    Diverticulosis    DM (diabetes mellitus) (HCC)    takes Victoza  daily as well as Lantus  and Humalog    Empty sella (HCC)    on MRI in 2009.   Glaucoma    Headache(784.0)    last migraine-4-37yrs ago   History of blood transfusion    no abnormal reaction noted   History of colon polyps    benign   HTN (hypertension)    takes Benicar ,Imdur ,and Bystolic  daily   Hyperlipidemia    takes Lipitor daily   Joint swelling    Nocturia    Obesity hypoventilation syndrome (HCC)    Obstructive sleep apnea 02/2018   Notably improved split-night study with weight loss from 270 (2017) down to 250 pounds (2019).   Pancreatitis    takes  Pancrelipase  daily   Parkinson's disease (HCC)    takes Sinemet  daily   Peripheral neuropathy    Pneumonia 2012   Seizures (HCC)    takes Lamictal  daily and Primidone  nightly;last seizure 2wks ago   Urinary urgency    With increased frequency   Varicose veins of both lower extremities with pain    With edema.  Takes daily Lasix     ALLERGIES:  is allergic to other, penicillins, sulfa antibiotics, aspirin , and codeine.  MEDICATIONS:  Current Outpatient Medications  Medication Sig Dispense Refill   acetaminophen  (TYLENOL ) 500 MG tablet Take 500-1,000 mg by mouth every 4 (four) hours as needed for moderate pain or mild pain.     albuterol  (VENTOLIN  HFA) 108 (90 Base) MCG/ACT inhaler Inhale 1-2 puffs into the lungs every 6 (six) hours as needed for wheezing or shortness  of breath. 17 each 5   Alcaftadine (LASTACAFT) 0.25 % SOLN Place 1-2 drops into both eyes daily as needed (allergy eye/irritation.).     Alcohol  Swabs (DROPSAFE ALCOHOL  PREP) 70 % PADS USE TWO TIMES DAILY AS DIRECTED 200 each 3   amiodarone  (PACERONE ) 100 MG tablet TAKE 1 TABLET EVERY DAY 90 tablet 3   apixaban  (ELIQUIS ) 5 MG TABS tablet Take 1 tablet (5 mg total) by mouth 2 (two) times daily. 180 tablet 3   Ascorbic Acid (VITAMIN C) 1000 MG tablet Take 1,000 mg by mouth in the morning.     atorvastatin  (LIPITOR) 40 MG tablet TAKE 1 TABLET EVERY DAY 90 tablet 3   B-D ULTRAFINE III SHORT PEN 31G X 8 MM MISC USE DAILY WITH VICTOZA  AND/OR NOVOLOG . 400 each 2   benzonatate  (TESSALON ) 100 MG capsule Take 1 capsule by mouth three times daily as needed for cough 30 capsule 0   Blood Glucose Monitoring Suppl (TRUE METRIX AIR GLUCOSE METER) w/Device KIT USE AS DIRECTED 1 kit 3   carvedilol  (COREG ) 12.5 MG tablet TAKE 1 TABLET TWICE DAILY 180 tablet 3   Dasiglucagon  HCl (ZEGALOGUE ) 0.6 MG/0.6ML SOAJ Inject 0.6 mg into the skin daily as needed (use for extremely low blood sugars, less than 60). 0.6 mL 3   diclofenac  sodium (VOLTAREN ) 1 % GEL Apply 2 g topically 4 (four) times daily as needed (pain).     FeFum-FePoly-FA-B Cmp-C-Biot (INTEGRA PLUS ) CAPS Take 1 capsule by mouth every morning. 30 capsule 2   ferrous sulfate  324 MG TBEC Take 324 mg by mouth daily with breakfast.     furosemide  (LASIX ) 20 MG tablet Take 4 tablets (80 mg total) by mouth twice a day. 720 tablet 3   guaiFENesin -dextromethorphan  (ROBITUSSIN DM) 100-10 MG/5ML syrup Take 10 mLs by mouth every 6 (six) hours as needed for cough. 118 mL 0   hydrALAZINE  (APRESOLINE ) 100 MG tablet Take 1 tablet (100 mg total) by mouth 3 (three) times daily. 270 tablet 3   hydrocortisone cream 1 % Apply 1 application topically daily as needed for itching.      insulin  glargine, 1 Unit Dial , (TOUJEO  SOLOSTAR) 300 UNIT/ML Solostar Pen INJECT 36 UNITS UNDER THE  SKIN AT BEDTIME (Patient taking differently: 20 Units.) 9 mL 3   insulin  lispro (HUMALOG  KWIKPEN) 100 UNIT/ML KwikPen Inject 0-10 Units into the skin See admin instructions. Sliding scale:Inject  (high blood sugar over 150). 150-199=2 units;200-249=4 units,250-299=6 units;300-349=8 units;350 above 10 units: Over 350 call physician 15 mL 0   Isopropyl Alcohol  (ALCOHOL  WIPES) 70 % MISC Apply 1 each topically 2 (two) times daily. 300 each 3  isosorbide  mononitrate (IMDUR ) 30 MG 24 hr tablet TAKE 1 TABLET EVERY DAY 90 tablet 2   lamoTRIgine  (LAMICTAL ) 200 MG tablet Take 1 tablet (200 mg total) by mouth 2 (two) times daily. 180 tablet 3   levETIRAcetam  (KEPPRA ) 500 MG tablet Take 2 tablets every night 180 tablet 3   loratadine  (CLARITIN ) 10 MG tablet Take 1 tablet (10 mg total) by mouth daily. 30 tablet 2   Multiple Vitamin (MULTIVITAMIN) tablet Take 1 tablet by mouth daily.     omeprazole (PRILOSEC) 10 MG capsule Take 10 mg by mouth every Monday, Wednesday, and Friday.     oxyCODONE  (OXY IR/ROXICODONE ) 5 MG immediate release tablet Take 5 mg by mouth every 6 (six) hours as needed (pain.).     OZEMPIC , 1 MG/DOSE, 4 MG/3ML SOPN INJECT 1MG  UNDER THE SKIN ONE TIME WEEKLY ON WEDNESDAYS 9 mL 3   Pancrelipase , Lip-Prot-Amyl, 25000-79000 units CPEP Take 2 capsules by mouth in the morning, at noon, and at bedtime.     potassium chloride  SA (KLOR-CON  M) 20 MEQ tablet Take 3 tablets (60 mEq total) by mouth daily. 300 tablet 3   sennosides-docusate sodium  (SENOKOT-S) 8.6-50 MG tablet Take 1-2 tablets by mouth at bedtime as needed for constipation.     sertraline  (ZOLOFT ) 50 MG tablet TAKE 1 TABLET EVERY DAY 90 tablet 3   tiZANidine  (ZANAFLEX ) 2 MG tablet TAKE 1 TABLET AT BEDTIME AS NEEDED 30 tablet 11   tolnaftate (TINACTIN) 1 % cream Apply 1 application topically daily as needed (foot fungus).      TRUE METRIX BLOOD GLUCOSE TEST test strip USE AS DIRECTED TO CHECK BLOOD SUGARS 2 TIMES PER DAY 200 strip 3    TRUEplus Lancets 33G MISC TEST BLOOD SUGAR TWICE DAILY AS DIRECTED 200 each 3   No current facility-administered medications for this visit.   Facility-Administered Medications Ordered in Other Visits  Medication Dose Route Frequency Provider Last Rate Last Admin   0.9 %  sodium chloride  infusion (Manually program via Guardrails IV Fluids)   Intravenous Once [place], Arlice Bene, FNP       0.9 %  sodium chloride  infusion (Manually program via Guardrails IV Fluids)   Intravenous Once [place], Arlice Bene, FNP        SURGICAL HISTORY:  Past Surgical History:  Procedure Laterality Date   ABDOMINAL HYSTERECTOMY     APPENDECTOMY     ATRIAL SEPTAL DEFECT(ASD) CLOSURE N/A 01/02/2023   Procedure: ATRIAL SEPTAL DEFECT(ASD) CLOSURE;  Surgeon: Arnoldo Lapping, MD;  Location: [facility] INVASIVE CV LAB;  Service: Cardiovascular;  Laterality: N/A;   BACK SURGERY     Cardiac Event Monitor  September-October 2017   Sinus rhythm with occasional PACs and artifact. No arrhythmias besides one short run of tachycardia.   CARDIOVERSION N/A 08/15/2022   Procedure: CARDIOVERSION;  Surgeon: Darlis Eisenmenger, MD;  Location: [facility] ENDOSCOPY;  Service: Cardiovascular;  Laterality: N/A;   CARDIOVERSION N/A 02/04/2023   Procedure: CARDIOVERSION;  Surgeon: Darlis Eisenmenger, MD;  Location: [facility] INVASIVE CV LAB;  Service: Cardiovascular;  Laterality: N/A;   CHOLECYSTECTOMY     COLONOSCOPY N/A 08/15/2012   Procedure: COLONOSCOPY;  Surgeon: Evangeline Hilts, MD;  Location: [facility] ENDOSCOPY;  Service: Endoscopy;  Laterality: N/A;   COLONOSCOPY WITH PROPOFOL  Left 06/17/2017   Procedure: COLONOSCOPY WITH PROPOFOL ;  Surgeon: Genell Ken, MD;  Location: [facility] ENDOSCOPY;  Service: Gastroenterology;  Laterality: Left;   COLONOSCOPY WITH PROPOFOL  N/A 04/03/2021   Procedure: COLONOSCOPY WITH PROPOFOL ;  Surgeon: Ozell Blunt, MD;  Location: [facility] ENDOSCOPY;  Service: Endoscopy;  Laterality: N/A;   CORONARY CALCIUM  SCORE AND CTA  04/2018   Coronary calcium  score  71.9.  Very large, hyperdynamic LAD wrapping apex giving rise to PDA.  Moderate LAD-diagonal and circumflex plaque -> CT FFR suggested positive findings in distal D1, D2 and distal circumflex.  Referred for cath. ==> FALSE POSITIVE   ESOPHAGOGASTRODUODENOSCOPY     eye cysts Bilateral    LASIK     LEFT HEART CATH AND CORONARY ANGIOGRAPHY N/A 05/16/2018   Procedure: LEFT HEART CATH AND CORONARY ANGIOGRAPHY;  Surgeon: Arty Binning, MD;  Location: [facility] INVASIVE CV LAB:  Angiographically normal coronary arteries with LVEDP of 21 mmHg.  Large draping hyperdominant LAD that wraps the apex and provides distal half of the PDA.  Relatively small caliber distal Cx -->proxLPDA, non-dom RCA. No Cx or Diag lesions -- FALSE + CT FFR   LEFT HEART CATH AND CORONARY ANGIOGRAPHY  2009/March 2012   2009: (Dr. Truett Gab) Nonobstructive CAD; 2012: Minimal CAD --> false positive stress test   NM MYOVIEW  LTD  01/2016; 01/2018   a)LOW RISK. No ischemia or infarction;; b) EF 55-60%. NO ST changes.  No ischemia or Infarction. NO significant RV enlargement.  LOW RISK.   POLYPECTOMY  04/03/2021   Procedure: POLYPECTOMY;  Surgeon: Ozell Blunt, MD;  Location: [facility] ENDOSCOPY;  Service: Endoscopy;;   Polysomnogram  02/2018   (Dr. Albertina Hugger from neurology): Split study was not adequately done due to low AHI.  She slept reclined and had an AHI less than 10, prolonged hypoxemia and no hypercapnia. -->  She has home oxygen  already prescribed, but did not meet criteria for nightly oxygen .  (Was noted that the patient did not cooperate well with study.  Titration was discussed.   PRESSURE SENSOR/CARDIOMEMS N/A 11/09/2019   Procedure: PRESSURE SENSOR/CARDIOMEMS;  Surgeon: Darlis Eisenmenger, MD;  Location: [facility] INVASIVE CV LAB;  Service: Cardiovascular;  Laterality: N/A;   RECTAL POLYPECTOMY     RIGHT HEART CATH N/A 09/07/2022   Procedure: RIGHT HEART CATH;  Surgeon: Darlis Eisenmenger, MD;  Location: [facility] INVASIVE CV LAB;  Service: Cardiovascular;   Laterality: N/A;   RIGHT/LEFT HEART CATH AND CORONARY ANGIOGRAPHY N/A 04/06/2019   Procedure: RIGHT/LEFT HEART CATH AND CORONARY ANGIOGRAPHY;  Surgeon: Arleen Lacer, MD;  Location: [facility] INVASIVE CV LAB; angiographically normal coronary arteries.  Moderately elevated AD LVEDP..  Mild pulmonary hypertension: PA P 56/14 mmHg-mean 31 mmHg.  RAP 10 mmHg.  RV P-EDP 54/4 mmHg - 12 mmHg.  PCWP 11-15 mmHg.  CO-CI: 7.35-3.73.   TEE WITHOUT CARDIOVERSION N/A 08/15/2022   Procedure: TRANSESOPHAGEAL ECHOCARDIOGRAM (TEE);  Surgeon: Darlis Eisenmenger, MD;  Location: [facility] ENDOSCOPY;  Service: Cardiovascular;  Laterality: N/A;   TONSILLECTOMY     TRANSTHORACIC ECHOCARDIOGRAM  01/2016; 05/2017   A) Normal LV chamber size with mod LVH pattern. EF 50-55%. Severe LA dilation. Mod RA dilation. PAP elevated at 38 mmHg;; B) Mild concentric LVH.  EF 55-60% & no RWMA.  Grade 2 DD.  Diastolic flattening of the ventricular septum == ? elevated PAP.  Mild aortic valve calcification/sclerosis.  Mod LA dilation.  Mild RV dilation & Mod RA dilation   TRANSTHORACIC ECHOCARDIOGRAM  10/2017; 01/2018   a) EF 55-60%.  No RWMA,  Moderate LA dilation.  Mild LA dilation.  Peak PA pressures ~57 mmHg (moderate);;; b) EF 55-60%.  GRII DD.  Suggestion of RV volume overload with moderate RV dilation.  Severe LA and RA dilation.Aaron Aas  PA pressure ~67%.   TRANSTHORACIC ECHOCARDIOGRAM  01/2019   Normal LV size and function.  EF 50 to 55%.  Impaired relaxation.  RV appears to have moderately reduced function.  Severely enlarged.  Cannot fully assess RV pressures.  Hypermobile interatrial septum.  Right atrium severely dilated.   TUBAL LIGATION     vaginal cyst removed     several times    REVIEW OF SYSTEMS:   Constitutional: Positive for fatigue. Negative for appetite change, chills, fever and unexpected weight change.  HENT: Negative for mouth sores, nosebleeds, sore throat and trouble swallowing.   Eyes: Negative for eye problems and icterus.   Respiratory: Positive for stable dyspnea on exertion. Negative for cough, hemoptysis, and wheezing.   Cardiovascular: Negative for chest pain. Positive for stable bilateral lower extremity swelling.  Gastrointestinal: Positive for mild nausea and intermittent constipation. Negative for abdominal pain, diarrhea, and vomiting.  Genitourinary: Negative for bladder incontinence, difficulty urinating, dysuria, frequency and hematuria.   Musculoskeletal: Negative for back pain, gait problem, neck pain and neck stiffness.  Skin: Negative for itching and rash.  Neurological: Negative for dizziness, extremity weakness, gait problem, headaches, light-headedness and seizures.  Hematological: Negative for adenopathy. Does not bruise/bleed easily.  Psychiatric/Behavioral: Negative for confusion, depression and sleep disturbance. The patient is not nervous/anxious.    PHYSICAL EXAMINATION:  There were no vitals taken for this visit.  ECOG PERFORMANCE STATUS: 1-2  Physical Exam  Constitutional: Oriented to person, place, and time and well-developed, well-nourished, and in no distress.  HENT:  Head: Normocephalic and atraumatic.  Mouth/Throat: Oropharynx is clear and moist. No oropharyngeal exudate.  Eyes: Conjunctivae are normal. Right eye exhibits no discharge. Left eye exhibits no discharge. No scleral icterus.  Neck: Normal range of motion. Neck supple.  Cardiovascular: Normal rate, regular rhythm, positive for mumur and intact distal pulses.   Pulmonary/Chest: Effort normal and breath sounds normal. No respiratory distress. No wheezes. No rales.  Abdominal: Soft. Bowel sounds are normal. Exhibits no distension and no mass. There is no tenderness.  Musculoskeletal: Normal range of motion. Bilateral lower extremity pitting edema.  Lymphadenopathy:    No cervical adenopathy.  Neurological: Alert and oriented to person, place, and time. Exhibits normal muscle tone. Ambulates with a cane Skin: Skin is  warm and dry. No rash noted. Not diaphoretic. No erythema. No pallor.  Psychiatric: Mood, memory and judgment normal.  Vitals reviewed  LABORATORY DATA: Lab Results  Component Value Date   WBC 4.6 10/23/2023   HGB 10.7 (L) 10/23/2023   HCT 34.7 (L) 10/23/2023   MCV 79.8 (L) 10/23/2023   PLT 256 10/23/2023      Chemistry      Component Value Date/Time   NA 135 10/23/2023 2337   NA 141 08/29/2023 1300   K 2.8 (L) 10/23/2023 2337   CL 101 10/23/2023 2337   CO2 24 10/23/2023 2337   BUN 13 10/23/2023 2337   BUN 13 08/29/2023 1300   CREATININE 1.39 (H) 10/23/2023 2337   CREATININE 0.97 04/04/2023 1310   CREATININE 0.91 10/04/2016 1144      Component Value Date/Time   CALCIUM  8.8 (L) 10/23/2023 2337   ALKPHOS 99 10/23/2023 2337   AST 38 10/23/2023 2337   AST 29 04/04/2023 1310   ALT 27 10/23/2023 2337   ALT 23 04/04/2023 1310   BILITOT 0.5 10/23/2023 2337   BILITOT 0.3 08/29/2023 1300   BILITOT 0.3 04/04/2023 1310       RADIOGRAPHIC STUDIES:  No results  found.   ASSESSMENT/PLAN:  This is a very pleasant 78 year old African-American female referred to the clinic for iron  deficiency anemia.   The patient is currently taking oral iron  supplements with OTC iron  p.o. daily and she is compliant with this.  She also receives IV iron  as needed most recent being with  venofer  300 mg on 09/25/23.  / This patient had several lab studies performed today including a CBC, iron  studies, and ferritin.  The patients labs from today demonstrated persistent anemia with a hemoglobin of 10.3 and continued and worsening microcytosis.  Her other lab studies are pending.   My feeling is she will require additional IV iron . I will place order for Venofer  300 mg weekly x 3 starting next week.  She prefers for her iron  to be performed at the [facility] instead of going to another facility.    I strongly encouraged her to continue taking her iron  supplement with vitamin C. I recommended she  take this before bed if it causes mild nausea to see if that helps. I discussed that with a history of GI bleeding and being on a blood thinner, the patient will likely have recurrent bouts of anemia. She is going to see her gastroenterologist next month.     We will see her back in 2-3 months for labs and repeat blood work. Of course, if she has worsening symptoms of anemia to call sooner for repeat labs and close monitoring  I encouraged the patient to get a life alert button due to her recent hypoglycemic and seizure episode. She says she already has one.    I gave her a copy of her appointments and addresses in the [facility] system per patient request.  The patient was advised to call immediately if she has any concerning symptoms in the interval. The patient voices understanding of current disease status and treatment options and is in agreement with the current care plan. All questions were answered. The patient knows to call the clinic with any problems, questions or concerns. We can certainly see the patient much sooner if necessary        No orders of the defined types were placed in this encounter.    The total time spent in the appointment was 20-29 minutes  Daymein Nunnery L Torey Reinard, PA-C 12/02/23

## 2023-12-04 ENCOUNTER — Ambulatory Visit

## 2023-12-04 DIAGNOSIS — Z0000 Encounter for general adult medical examination without abnormal findings: Secondary | ICD-10-CM

## 2023-12-04 NOTE — Progress Notes (Signed)
 Subjective:   [name] is a 78 y.o. who presents for a Medicare Wellness preventive visit.  As a reminder, Annual Wellness Visits don't include a physical exam, and some assessments may be limited, especially if this visit is performed virtually. We may recommend an in-person follow-up visit with your provider if needed.  Visit Complete: Virtual I connected with  [name] on 12/04/23 by a audio enabled telemedicine application and verified that I am speaking with the correct person using two identifiers.  Patient Location: Home  Provider Location: Office/Clinic  I discussed the limitations of evaluation and management by telemedicine. The patient expressed understanding and agreed to proceed.  Vital Signs: Because this visit was a virtual/telehealth visit, some criteria may be missing or patient reported. Any vitals not documented were not able to be obtained and vitals that have been documented are patient reported.  VideoError- Librarian, academic were attempted between this provider and patient, however failed, due to patient having technical difficulties OR patient did not have access to video capability.  We continued and completed visit with audio only.   Persons Participating in Visit: Patient.  AWV Questionnaire: No: Patient Medicare AWV questionnaire was not completed prior to this visit.  Cardiac Risk Factors include: advanced age (>57men, >47 women);diabetes mellitus;dyslipidemia;hypertension     Objective:     Today's Vitals   There is no height or weight on file to calculate BMI.     12/04/2023   12:38 PM 11/15/2023   11:19 AM 10/23/2023   11:35 PM 09/11/2023    8:06 AM 05/16/2023    8:28 AM 04/22/2023   11:11 AM 02/04/2023    8:48 AM  Advanced Directives  Does Patient Have a Medical Advance Directive? Yes Yes No No Yes Yes Yes  Type of Estate agent of [place];Living will Healthcare Power of [employer] of [place];Living will Healthcare Power of [employer] of [place];Living will  Does patient want to make changes to medical advance directive?    No - Patient declined     Copy of Healthcare Power of Attorney in Chart? Yes - validated most recent copy scanned in chart (See row information)        Would patient like information on creating a medical advance directive?   No - Patient declined        Current Medications (verified) Outpatient Encounter Medications as of 12/04/2023  Medication Sig   acetaminophen  (TYLENOL ) 500 MG tablet Take 500-1,000 mg by mouth every 4 (four) hours as needed for moderate pain or mild pain.   albuterol  (VENTOLIN  HFA) 108 (90 Base) MCG/ACT inhaler Inhale 1-2 puffs into the lungs every 6 (six) hours as needed for wheezing or shortness of breath.   Alcaftadine (LASTACAFT) 0.25 % SOLN Place 1-2 drops into both eyes daily as needed (allergy eye/irritation.).   Alcohol  Swabs (DROPSAFE ALCOHOL  PREP) 70 % PADS USE TWO TIMES DAILY AS DIRECTED   amiodarone  (PACERONE ) 100 MG tablet TAKE 1 TABLET EVERY DAY   apixaban  (ELIQUIS ) 5 MG TABS tablet Take 1 tablet (5 mg total) by mouth 2 (two) times daily.   Ascorbic Acid (VITAMIN C) 1000 MG tablet Take 1,000 mg by mouth in the morning.   atorvastatin  (LIPITOR) 40 MG tablet TAKE 1 TABLET EVERY DAY   B-D ULTRAFINE III SHORT PEN 31G X 8 MM MISC USE DAILY WITH VICTOZA  AND/OR NOVOLOG .   benzonatate  (TESSALON ) 100 MG capsule Take 1 capsule by  mouth three times daily as needed for cough   Blood Glucose Monitoring Suppl (TRUE METRIX AIR GLUCOSE METER) w/Device KIT USE AS DIRECTED   carvedilol  (COREG ) 12.5 MG tablet TAKE 1 TABLET TWICE DAILY   Dasiglucagon  HCl (ZEGALOGUE ) 0.6 MG/0.6ML SOAJ Inject 0.6 mg into the skin daily as needed (use for extremely low blood sugars, less than 60).   diclofenac  sodium (VOLTAREN ) 1 % GEL Apply 2 g topically 4 (four) times daily as needed (pain).   ferrous sulfate  324 MG  TBEC Take 324 mg by mouth daily with breakfast.   furosemide  (LASIX ) 20 MG tablet Take 4 tablets (80 mg total) by mouth twice a day.   guaiFENesin -dextromethorphan  (ROBITUSSIN DM) 100-10 MG/5ML syrup Take 10 mLs by mouth every 6 (six) hours as needed for cough.   hydrALAZINE  (APRESOLINE ) 100 MG tablet Take 1 tablet (100 mg total) by mouth 3 (three) times daily.   hydrocortisone cream 1 % Apply 1 application topically daily as needed for itching.    insulin  glargine, 1 Unit Dial , (TOUJEO  SOLOSTAR) 300 UNIT/ML Solostar Pen INJECT 36 UNITS UNDER THE SKIN AT BEDTIME (Patient taking differently: 20 Units.)   insulin  lispro (HUMALOG  KWIKPEN) 100 UNIT/ML KwikPen Inject 0-10 Units into the skin See admin instructions. Sliding scale:Inject  (high blood sugar over 150). 150-199=2 units;200-249=4 units,250-299=6 units;300-349=8 units;350 above 10 units: Over 350 call physician   Isopropyl Alcohol  (ALCOHOL  WIPES) 70 % MISC Apply 1 each topically 2 (two) times daily.   isosorbide  mononitrate (IMDUR ) 30 MG 24 hr tablet TAKE 1 TABLET EVERY DAY   lamoTRIgine  (LAMICTAL ) 200 MG tablet Take 1 tablet (200 mg total) by mouth 2 (two) times daily.   levETIRAcetam  (KEPPRA ) 500 MG tablet Take 2 tablets every night   Multiple Vitamin (MULTIVITAMIN) tablet Take 1 tablet by mouth daily.   omeprazole (PRILOSEC) 10 MG capsule Take 10 mg by mouth every Monday, Wednesday, and Friday.   OZEMPIC , 1 MG/DOSE, 4 MG/3ML SOPN INJECT 1MG  UNDER THE SKIN ONE TIME WEEKLY ON WEDNESDAYS   Pancrelipase , Lip-Prot-Amyl, 25000-79000 units CPEP Take 2 capsules by mouth in the morning, at noon, and at bedtime.   potassium chloride  SA (KLOR-CON  M) 20 MEQ tablet Take 3 tablets (60 mEq total) by mouth daily.   sennosides-docusate sodium  (SENOKOT-S) 8.6-50 MG tablet Take 1-2 tablets by mouth at bedtime as needed for constipation.   sertraline  (ZOLOFT ) 50 MG tablet TAKE 1 TABLET EVERY DAY   tiZANidine  (ZANAFLEX ) 2 MG tablet TAKE 1 TABLET AT BEDTIME AS  NEEDED   tolnaftate (TINACTIN) 1 % cream Apply 1 application topically daily as needed (foot fungus).    TRUE METRIX BLOOD GLUCOSE TEST test strip USE AS DIRECTED TO CHECK BLOOD SUGARS 2 TIMES PER DAY   TRUEplus Lancets 33G MISC TEST BLOOD SUGAR TWICE DAILY AS DIRECTED   FeFum-FePoly-FA-B Cmp-C-Biot (INTEGRA PLUS ) CAPS Take 1 capsule by mouth every morning. (Patient not taking: Reported on 12/04/2023)   loratadine  (CLARITIN ) 10 MG tablet Take 1 tablet (10 mg total) by mouth daily.   oxyCODONE  (OXY IR/ROXICODONE ) 5 MG immediate release tablet Take 5 mg by mouth every 6 (six) hours as needed (pain.). (Patient not taking: Reported on 12/04/2023)   Facility-Administered Encounter Medications as of 12/04/2023  Medication   0.9 %  sodium chloride  infusion (Manually program via Guardrails IV Fluids)   0.9 %  sodium chloride  infusion (Manually program via Guardrails IV Fluids)    Allergies (verified) Other, Penicillins, Sulfa antibiotics, Aspirin , and Codeine   History: Past Medical History:  Diagnosis Date   Abnormal liver function     in the past.   Anemia    Arthritis    all over   Bruises easily    Cataract    right eye;immature   CHF (congestive heart failure) (HCC)    Chronic back pain    stenosis   Chronic cough    Chronic kidney disease    COPD (chronic obstructive pulmonary disease) (HCC)    COVID    aug/sept 2021   Demand myocardial infarction [facility]) 2012   Demand Infarction in setting of Pancreatitis --> mild Troponin elevation, NON-OBSTRUCTIVE CAD   Depression    takes Abilify  daily as well as Zoloft    Diastolic heart failure 2010   Grade 1 diastolic Dysfunction by Echo    Diverticulosis    DM (diabetes mellitus) (HCC)    takes Victoza  daily as well as Lantus  and Humalog    Empty sella (HCC)    on MRI in 2009.   Glaucoma    Headache(784.0)    last migraine-4-41yrs ago   History of blood transfusion    no abnormal reaction noted   History of colon polyps    benign    HTN (hypertension)    takes Benicar ,Imdur ,and Bystolic  daily   Hyperlipidemia    takes Lipitor daily   Joint swelling    Nocturia    Obesity hypoventilation syndrome (HCC)    Obstructive sleep apnea 02/2018   Notably improved split-night study with weight loss from 270 (2017) down to 250 pounds (2019).   Pancreatitis    takes Pancrelipase  daily   Parkinson's disease (HCC)    takes Sinemet  daily   Peripheral neuropathy    Pneumonia 2012   Seizures (HCC)    takes Lamictal  daily and Primidone  nightly;last seizure 2wks ago   Urinary urgency    With increased frequency   Varicose veins of both lower extremities with pain    With edema.  Takes daily Lasix    Past Surgical History:  Procedure Laterality Date   ABDOMINAL HYSTERECTOMY     APPENDECTOMY     ATRIAL SEPTAL DEFECT(ASD) CLOSURE N/A 01/02/2023   Procedure: ATRIAL SEPTAL DEFECT(ASD) CLOSURE;  Surgeon: Arnoldo Lapping, MD;  Location: [facility] INVASIVE CV LAB;  Service: Cardiovascular;  Laterality: N/A;   BACK SURGERY     Cardiac Event Monitor  September-October 2017   Sinus rhythm with occasional PACs and artifact. No arrhythmias besides one short run of tachycardia.   CARDIOVERSION N/A 08/15/2022   Procedure: CARDIOVERSION;  Surgeon: Darlis Eisenmenger, MD;  Location: [facility] ENDOSCOPY;  Service: Cardiovascular;  Laterality: N/A;   CARDIOVERSION N/A 02/04/2023   Procedure: CARDIOVERSION;  Surgeon: Darlis Eisenmenger, MD;  Location: [facility] INVASIVE CV LAB;  Service: Cardiovascular;  Laterality: N/A;   CHOLECYSTECTOMY     COLONOSCOPY N/A 08/15/2012   Procedure: COLONOSCOPY;  Surgeon: Evangeline Hilts, MD;  Location: [facility] ENDOSCOPY;  Service: Endoscopy;  Laterality: N/A;   COLONOSCOPY WITH PROPOFOL  Left 06/17/2017   Procedure: COLONOSCOPY WITH PROPOFOL ;  Surgeon: Genell Ken, MD;  Location: [facility] ENDOSCOPY;  Service: Gastroenterology;  Laterality: Left;   COLONOSCOPY WITH PROPOFOL  N/A 04/03/2021   Procedure: COLONOSCOPY WITH PROPOFOL ;  Surgeon: Ozell Blunt,  MD;  Location: [facility] ENDOSCOPY;  Service: Endoscopy;  Laterality: N/A;   CORONARY CALCIUM  SCORE AND CTA  04/2018   Coronary calcium  score 71.9.  Very large, hyperdynamic LAD wrapping apex giving rise to PDA.  Moderate LAD-diagonal and circumflex plaque -> CT FFR suggested positive findings in distal D1, D2  and distal circumflex.  Referred for cath. ==> FALSE POSITIVE   ESOPHAGOGASTRODUODENOSCOPY     eye cysts Bilateral    LASIK     LEFT HEART CATH AND CORONARY ANGIOGRAPHY N/A 05/16/2018   Procedure: LEFT HEART CATH AND CORONARY ANGIOGRAPHY;  Surgeon: Arty Binning, MD;  Location: [facility] INVASIVE CV LAB:  Angiographically normal coronary arteries with LVEDP of 21 mmHg.  Large draping hyperdominant LAD that wraps the apex and provides distal half of the PDA.  Relatively small caliber distal Cx -->proxLPDA, non-dom RCA. No Cx or Diag lesions -- FALSE + CT FFR   LEFT HEART CATH AND CORONARY ANGIOGRAPHY  2009/March 2012   2009: (Dr. Truett Gab) Nonobstructive CAD; 2012: Minimal CAD --> false positive stress test   NM MYOVIEW  LTD  01/2016; 01/2018   a)LOW RISK. No ischemia or infarction;; b) EF 55-60%. NO ST changes.  No ischemia or Infarction. NO significant RV enlargement.  LOW RISK.   POLYPECTOMY  04/03/2021   Procedure: POLYPECTOMY;  Surgeon: Ozell Blunt, MD;  Location: [facility] ENDOSCOPY;  Service: Endoscopy;;   Polysomnogram  02/2018   (Dr. Albertina Hugger from neurology): Split study was not adequately done due to low AHI.  She slept reclined and had an AHI less than 10, prolonged hypoxemia and no hypercapnia. -->  She has home oxygen  already prescribed, but did not meet criteria for nightly oxygen .  (Was noted that the patient did not cooperate well with study.  Titration was discussed.   PRESSURE SENSOR/CARDIOMEMS N/A 11/09/2019   Procedure: PRESSURE SENSOR/CARDIOMEMS;  Surgeon: Darlis Eisenmenger, MD;  Location: [facility] INVASIVE CV LAB;  Service: Cardiovascular;  Laterality: N/A;   RECTAL POLYPECTOMY     RIGHT HEART CATH  N/A 09/07/2022   Procedure: RIGHT HEART CATH;  Surgeon: Darlis Eisenmenger, MD;  Location: [facility] INVASIVE CV LAB;  Service: Cardiovascular;  Laterality: N/A;   RIGHT/LEFT HEART CATH AND CORONARY ANGIOGRAPHY N/A 04/06/2019   Procedure: RIGHT/LEFT HEART CATH AND CORONARY ANGIOGRAPHY;  Surgeon: Arleen Lacer, MD;  [facility] INVAS[facility] CV LAB; angiographically normal coronary arteries.  Moderately elevated AD LVEDP..  Mild pulmonary hypertension: PA P 56/14 mmHg-mean 31 mmHg.  RAP 10 mmHg.  RV P-EDP 54/4 mmHg - 12 mmHg.  PCWP 11-15 mmHg.  CO-CI: 7.35-3.73.   TEE WITHOUT CARDIOVERSION N/A 08/15/2022   Procedure: TRANSESOPHAGEAL ECHOCARDIOGRAM (TEE);  Surgeon: Darlis Eisenmenger, MD;  Location: [facility] ENDOSCOPY;  Service: Cardiovascular;  Laterality: N/A;   TONSILLECTOMY     TRANSTHORACIC ECHOCARDIOGRAM  01/2016; 05/2017   A) Normal LV chamber size with mod LVH pattern. EF 50-55%. Severe LA dilation. Mod RA dilation. PAP elevated at 38 mmHg;; B) Mild concentric LVH.  EF 55-60% & no RWMA.  Grade 2 DD.  Diastolic flattening of the ventricular septum == ? elevated PAP.  Mild aortic valve calcification/sclerosis.  Mod LA dilation.  Mild RV dilation & Mod RA dilation   TRANSTHORACIC ECHOCARDIOGRAM  10/2017; 01/2018   a) EF 55-60%.  No RWMA,  Moderate LA dilation.  Mild LA dilation.  Peak PA pressures ~57 mmHg (moderate);;; b) EF 55-60%.  GRII DD.  Suggestion of RV volume overload with moderate RV dilation.  Severe LA and RA dilation.Aaron Aas  PA pressure ~67%.   TRANSTHORACIC ECHOCARDIOGRAM  01/2019   Normal LV size and function.  EF 50 to 55%.  Impaired relaxation.  RV appears to have moderately reduced function.  Severely enlarged.  Cannot fully assess RV pressures.  Hypermobile interatrial septum.  Right atrium severely dilated.  TUBAL LIGATION     vaginal cyst removed     several times   Family History  Problem Relation Age of Onset   Allergies Father    Heart disease Father        before 42   Heart failure Father     Hypertension Father    Hyperlipidemia Father    Heart disease Mother    Diabetes Mother    Hypertension Mother    Hyperlipidemia Mother    Hypertension Sister    Heart disease Sister        before 11   Hyperlipidemia Sister    Diabetes Brother    Hypertension Brother    Diabetes Sister    Hypertension Sister    Diabetes Son    Hypertension Son    Cancer Other    Social History   Socioeconomic History   Marital status: Divorced    Spouse name: Not on file   Number of children: Not on file   Years of education: Not on file   Highest education level: Not on file  Occupational History   Occupation: Disabled    Comment: CNA  Tobacco Use   Smoking status: Former    Current packs/day: 0.50    Average packs/day: 0.5 packs/day for 25.0 years (12.5 ttl pk-yrs)    Types: Cigarettes   Smokeless tobacco: Never   Tobacco comments:    quit smoking 20+ytrs ago  Vaping Use   Vaping status: Never Used  Substance and Sexual Activity   Alcohol  use: No   Drug use: No   Sexual activity: Not Currently    Birth control/protection: Surgical  Other Topics Concern   Not on file  Social History Narrative   She lives in [place]. She has lots of family in the area and is accompanied by her sister.   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   She has a distant history of smoking, quit 20 years ago.   Right handed    Social Drivers of Health   Financial Resource Strain: Low Risk  (12/04/2023)   Overall Financial Resource Strain (CARDIA)    Difficulty of Paying Living Expenses: Not hard at all  Food Insecurity: No Food Insecurity (12/04/2023)   Hunger Vital Sign    Worried About Running Out of Food in the Last Year: Never true    Ran Out of Food in the Last Year: Never true  Transportation Needs: No Transportation Needs (12/04/2023)   PRAPARE - Administrator, Civil Service (Medical): No    Lack of Transportation (Non-Medical): No   Physical Activity: Insufficiently Active (12/04/2023)   Exercise Vital Sign    Days of Exercise per Week: 2 days    Minutes of Exercise per Session: 20 min  Stress: Stress Concern Present (12/04/2023)   [employer] of Occupational Health - Occupational Stress Questionnaire    Feeling of Stress : To some extent  Social Connections: Moderately Isolated (12/04/2023)   Social Connection and Isolation Panel [NHANES]    Frequency of Communication with Friends and Family: More than three times a week    Frequency of Social Gatherings with Friends and Family: Once a week    Attends Religious Services: More than 4 times per year    Active Member of [employer] or Organizations: No    Attends Banker Meetings: Never    Marital Status: Divorced    Tobacco Counseling Counseling given: Not Answered Tobacco comments:  quit smoking 20+ytrs ago    Clinical Intake:  Pre-visit preparation completed: Yes  Pain : No/denies pain     Nutritional Risks: None Diabetes: Yes CBG done?: No Did pt. bring in CBG monitor from home?: No  Lab Results  Component Value Date   HGBA1C 8.3 (H) 08/29/2023   HGBA1C 7.8 (H) 05/08/2023   HGBA1C 7.3 (H) 12/31/2022     How often do you need to have someone help you when you read instructions, pamphlets, or other written materials from your doctor or pharmacy?: 1 - Never  Interpreter Needed?: No  Information entered by :: NAllen LPN   Activities of Daily Living     12/04/2023   12:18 PM 02/04/2023    8:44 AM  In your present state of health, do you have any difficulty performing the following activities:  Hearing? 0 0  Vision? 0 0  Difficulty concentrating or making decisions? 0 0  Walking or climbing stairs? 0 1  Dressing or bathing? 0 0  Doing errands, shopping? 1   Comment has someone with her   Preparing Food and eating ? N   Using the Toilet? N   In the past six months, have you accidently leaked urine? Y   Do you have problems  with loss of bowel control? N   Managing your Medications? N   Managing your Finances? N   Housekeeping or managing your Housekeeping? N     Patient Care Team: Cleave Curling, MD as PCP - General (Internal Medicine) Arleen Lacer, MD as PCP - Cardiology (Cardiology) Laverna Pott, NP as Referring Physician (Nurse Practitioner) Evangeline Hilts, MD as Attending Physician (Gastroenterology) Jannice Mends, MD as Referring Physician (Neurology) Mignon Alberts, DPM as Consulting Physician (Podiatry) Ty Gales Lyna Sandhoff, MD as Consulting Physician (Neurology)  I have updated your Care Teams any recent Medical Services you may have received from other providers in the past year.     Assessment:    This is a routine wellness examination for Salayah.  Hearing/Vision screen Hearing Screening - Comments:: Denies hearing issues Vision Screening - Comments:: Regular eye exams, [facility]   Goals Addressed             This Visit's Progress    Patient Stated       12/04/2023, wants to move out       Depression Screen     12/04/2023   12:41 PM 08/29/2023   11:19 AM 05/06/2023   10:56 AM 04/22/2023   11:11 AM 12/31/2022    3:40 PM 10/01/2022   11:12 AM 07/30/2022   11:59 AM  PHQ 2/9 Scores  PHQ - 2 Score 1 0  2 0 0 0  PHQ- 9 Score 2 0  8 0 7 0  Exception Documentation   Other- indicate reason in comment box      Not completed   no change        Fall Risk     12/04/2023   12:40 PM 11/15/2023   11:18 AM 08/29/2023   11:19 AM 05/16/2023    8:27 AM 05/08/2023   11:42 AM  Fall Risk   Falls in the past year? 1 1 0 1 0  Comment tripped over dog      Number falls in past yr: 0 1 0 0 0  Injury with Fall? 0 0 0 0 0  Risk for fall due to : Medication side effect;Impaired mobility;Impaired balance/gait  No Fall Risks  No Fall Risks  Follow up Falls prevention discussed;Falls evaluation completed Falls evaluation completed Falls evaluation completed Falls evaluation completed Falls  evaluation completed    MEDICARE RISK AT HOME:  Medicare Risk at Home Any stairs in or around the home?: Yes If so, are there any without handrails?: No Home free of loose throw rugs in walkways, pet beds, electrical cords, etc?: Yes Adequate lighting in your home to reduce risk of falls?: Yes Life alert?: No Use of a cane, walker or w/c?: Yes Grab bars in the bathroom?: Yes Shower chair or bench in shower?: Yes Elevated toilet seat or a handicapped toilet?: Yes  TIMED UP AND GO:  Was the test performed?  No  Cognitive Function: 6CIT completed        12/04/2023   12:42 PM 07/18/2022   11:13 AM 07/06/2021    3:25 PM 08/11/2020    4:04 PM 01/06/2020   11:30 AM  6CIT Screen  What Year? 0 points 0 points 0 points 0 points 0 points  What month? 0 points 0 points 0 points 0 points 0 points  What time? 0 points 0 points 0 points 3 points 0 points  Count back from 20 0 points 4 points 2 points 2 points 2 points  Months in reverse 0 points 0 points 0 points 0 points 0 points  Repeat phrase 2 points 4 points 0 points 0 points 4 points  Total Score 2 points 8 points 2 points 5 points 6 points    Immunizations Immunization History  Administered Date(s) Administered   Fluad Quad(high Dose 65+) 06/06/2020, 03/27/2021, 03/15/2022   Fluad Trivalent(High Dose 65+) 03/05/2023   Influenza Split 04/11/2011, 06/30/2013   Influenza, High Dose Seasonal PF 02/14/2017, 04/24/2018   Influenza-Unspecified 04/07/2014   PFIZER(Purple Top)SARS-COV-2 Vaccination 08/06/2019, 08/31/2019, 05/04/2020, 11/08/2020   PNEUMOCOCCAL CONJUGATE-20 11/08/2020   Pfizer Covid-19 Vaccine Bivalent Booster 29yrs & up 07/07/2021   Pfizer(Comirnaty)Fall Seasonal Vaccine 12 years and older 05/08/2023   Pneumococcal Polysaccharide-23 06/25/2010   Tdap 05/18/2012   Zoster Recombinant(Shingrix) 07/07/2021, 12/12/2021   Zoster, Live 04/14/2014    Screening Tests Health Maintenance  Topic Date Due   FOOT EXAM   09/07/2022   OPHTHALMOLOGY EXAM  09/25/2023   COVID-19 Vaccine (7 - Pfizer risk 2024-25 season) 11/05/2023   INFLUENZA VACCINE  01/24/2024   HEMOGLOBIN A1C  02/29/2024   Diabetic kidney evaluation - Urine ACR  08/28/2024   MAMMOGRAM  09/26/2024   Diabetic kidney evaluation - eGFR measurement  10/22/2024   Medicare Annual Wellness (AWV)  12/03/2024   Colonoscopy  04/04/2031   Pneumonia Vaccine 25+ Years old  Completed   DEXA SCAN  Completed   Hepatitis C Screening  Completed   Zoster Vaccines- Shingrix  Completed   HPV VACCINES  Aged Out   Meningococcal B Vaccine  Aged Out   DTaP/Tdap/Td  Discontinued    Health Maintenance  Health Maintenance Due  Topic Date Due   FOOT EXAM  09/07/2022   OPHTHALMOLOGY EXAM  09/25/2023   COVID-19 Vaccine (7 - Pfizer risk 2024-25 season) 11/05/2023   Health Maintenance Items Addressed: Has an appointment with eye doctor on 12/11/2023. Feet will be checked at next appointment on 01/14/2024.  Additional Screening:  Vision Screening: Recommended annual ophthalmology exams for early detection of glaucoma and other disorders of the eye. Would you like a referral to an eye doctor? No    Dental Screening: Recommended annual dental exams for proper oral hygiene  Community Resource Referral / Chronic Care Management: CRR required  this visit?  No   CCM required this visit?  No   Plan:    I have personally reviewed and noted the following in the patient's chart:   Medical and social history Use of alcohol , tobacco or illicit drugs  Current medications and supplements including opioid prescriptions. Patient is not currently taking opioid prescriptions. Functional ability and status Nutritional status Physical activity Advanced directives List of other physicians Hospitalizations, surgeries, and ER visits in previous 12 months Vitals Screenings to include cognitive, depression, and falls Referrals and appointments  In addition, I have  reviewed and discussed with patient certain preventive protocols, quality metrics, and best practice recommendations. A written personalized care plan for preventive services as well as general preventive health recommendations were provided to patient.   Areatha Beecham, LPN   [id number]   After Visit Summary: (Pick Up) Due to this being a telephonic visit, with patients personalized plan was offered to patient and patient has requested to Pick up at office.  Notes: Nothing significant to report at this time.

## 2023-12-04 NOTE — Patient Instructions (Signed)
 [name] , Thank you for taking time out of your busy schedule to complete your Annual Wellness Visit with me. I enjoyed our conversation and look forward to speaking with you again next year. I, as well as your care team,  appreciate your ongoing commitment to your health goals. Please review the following plan we discussed and let me know if I can assist you in the future. Your Game plan/ To Do List    Referrals: If you haven't heard from the office you've been referred to, please reach out to them at the phone provided.  N/a Follow up Visits: Next Medicare AWV with our clinical staff: 01/13/2025 at 12:00   Have you seen your provider in the last 6 months (3 months if uncontrolled diabetes)? Yes Next Office Visit with your provider: 01/14/2024 at 10:20  Clinician Recommendations:  Aim for 30 minutes of exercise or brisk walking, 6-8 glasses of water , and 5 servings of fruits and vegetables each day.       This is a list of the screening recommended for you and due dates:  Health Maintenance  Topic Date Due   Complete foot exam   09/07/2022   Eye exam for diabetics  09/25/2023   COVID-19 Vaccine (7 - Pfizer risk 2024-25 season) 11/05/2023   Flu Shot  01/24/2024   Hemoglobin A1C  02/29/2024   Yearly kidney health urinalysis for diabetes  08/28/2024   Mammogram  09/26/2024   Yearly kidney function blood test for diabetes  10/22/2024   Medicare Annual Wellness Visit  12/03/2024   Colon Cancer Screening  04/04/2031   Pneumonia Vaccine  Completed   DEXA scan (bone density measurement)  Completed   Hepatitis C Screening  Completed   Zoster (Shingles) Vaccine  Completed   HPV Vaccine  Aged Out   Meningitis B Vaccine  Aged Out   DTaP/Tdap/Td vaccine  Discontinued    Advanced directives: (In Chart) A copy of your advanced directives are scanned into your chart should your provider ever need it. Advance Care Planning is important because it:  [x]  Makes sure you receive the medical care  that is consistent with your values, goals, and preferences  [x]  It provides guidance to your family and loved ones and reduces their decisional burden about whether or not they are making the right decisions based on your wishes.  Follow the link provided in your after visit summary or read over the paperwork we have mailed to you to help you started getting your Advance Directives in place. If you need assistance in completing these, please reach out to us  so that we can help you!  See attachments for Preventive Care and Fall Prevention Tips.

## 2023-12-05 ENCOUNTER — Inpatient Hospital Stay: Admitting: Physician Assistant

## 2023-12-05 ENCOUNTER — Inpatient Hospital Stay: Attending: Physician Assistant

## 2023-12-05 VITALS — BP 154/70 | HR 60 | Temp 97.7°F | Resp 13 | Wt 183.5 lb

## 2023-12-05 DIAGNOSIS — D509 Iron deficiency anemia, unspecified: Secondary | ICD-10-CM | POA: Diagnosis not present

## 2023-12-05 DIAGNOSIS — Z79899 Other long term (current) drug therapy: Secondary | ICD-10-CM | POA: Diagnosis not present

## 2023-12-05 DIAGNOSIS — D500 Iron deficiency anemia secondary to blood loss (chronic): Secondary | ICD-10-CM | POA: Diagnosis not present

## 2023-12-05 LAB — CBC WITH DIFFERENTIAL (CANCER CENTER ONLY)
Abs Immature Granulocytes: 0 10*3/uL (ref 0.00–0.07)
Basophils Absolute: 0 10*3/uL (ref 0.0–0.1)
Basophils Relative: 1 %
Eosinophils Absolute: 0.1 10*3/uL (ref 0.0–0.5)
Eosinophils Relative: 2 %
HCT: 33 % — ABNORMAL LOW (ref 36.0–46.0)
Hemoglobin: 10.3 g/dL — ABNORMAL LOW (ref 12.0–15.0)
Immature Granulocytes: 0 %
Lymphocytes Relative: 37 %
Lymphs Abs: 1.2 10*3/uL (ref 0.7–4.0)
MCH: 23.8 pg — ABNORMAL LOW (ref 26.0–34.0)
MCHC: 31.2 g/dL (ref 30.0–36.0)
MCV: 76.4 fL — ABNORMAL LOW (ref 80.0–100.0)
Monocytes Absolute: 0.4 10*3/uL (ref 0.1–1.0)
Monocytes Relative: 13 %
Neutro Abs: 1.5 10*3/uL — ABNORMAL LOW (ref 1.7–7.7)
Neutrophils Relative %: 47 %
Platelet Count: 266 10*3/uL (ref 150–400)
RBC: 4.32 MIL/uL (ref 3.87–5.11)
RDW: 15.9 % — ABNORMAL HIGH (ref 11.5–15.5)
WBC Count: 3.2 10*3/uL — ABNORMAL LOW (ref 4.0–10.5)
nRBC: 0 % (ref 0.0–0.2)

## 2023-12-05 LAB — IRON AND IRON BINDING CAPACITY (CC-WL,HP ONLY)
Iron: 56 ug/dL (ref 28–170)
Saturation Ratios: 14 % (ref 10.4–31.8)
TIBC: 389 ug/dL (ref 250–450)
UIBC: 333 ug/dL

## 2023-12-05 LAB — FERRITIN: Ferritin: 26 ng/mL (ref 11–307)

## 2023-12-06 ENCOUNTER — Telehealth: Payer: Self-pay | Admitting: Physician Assistant

## 2023-12-06 ENCOUNTER — Telehealth: Payer: Self-pay | Admitting: Internal Medicine

## 2023-12-06 NOTE — Telephone Encounter (Signed)
 Left the patient a voicemail with the scheduled appointment details and will be maileda reminder.

## 2023-12-06 NOTE — Telephone Encounter (Signed)
 I called the patient to review her labs. I don't think she needs 3 iron  infusions. Her iron  is better than 3 months ago. Her ferritin is improved but is on the lower end of normal. Her Hbg is still in the 10's. I have cancelled the iron  infusion on 7/9 but we will keep the 6/23 and 6/30 to bolster her levels and keep the appointment to follow up with Dr. Marguerita Shih on 8/27. She expressed understanding.

## 2023-12-11 DIAGNOSIS — H04123 Dry eye syndrome of bilateral lacrimal glands: Secondary | ICD-10-CM | POA: Diagnosis not present

## 2023-12-11 DIAGNOSIS — H31093 Other chorioretinal scars, bilateral: Secondary | ICD-10-CM | POA: Diagnosis not present

## 2023-12-11 DIAGNOSIS — H43813 Vitreous degeneration, bilateral: Secondary | ICD-10-CM | POA: Diagnosis not present

## 2023-12-11 DIAGNOSIS — E119 Type 2 diabetes mellitus without complications: Secondary | ICD-10-CM | POA: Diagnosis not present

## 2023-12-11 DIAGNOSIS — Z961 Presence of intraocular lens: Secondary | ICD-10-CM | POA: Diagnosis not present

## 2023-12-12 ENCOUNTER — Other Ambulatory Visit: Payer: Self-pay | Admitting: Internal Medicine

## 2023-12-12 DIAGNOSIS — F339 Major depressive disorder, recurrent, unspecified: Secondary | ICD-10-CM

## 2023-12-16 ENCOUNTER — Inpatient Hospital Stay

## 2023-12-16 ENCOUNTER — Telehealth: Payer: Self-pay | Admitting: Internal Medicine

## 2023-12-16 NOTE — Telephone Encounter (Signed)
 Left the patient a voicemail to give me a call back to answer any questions she has per message sent to me.

## 2023-12-18 ENCOUNTER — Telehealth: Payer: Self-pay | Admitting: Internal Medicine

## 2023-12-18 NOTE — Telephone Encounter (Signed)
 Returned the patients call and left her a voicemail to call me back if needing to update her schedule. I also informed her that she is still scheduled tomorrow morning for iron .

## 2023-12-19 ENCOUNTER — Inpatient Hospital Stay

## 2023-12-19 VITALS — BP 122/55 | HR 59 | Temp 97.9°F | Resp 17

## 2023-12-19 DIAGNOSIS — D509 Iron deficiency anemia, unspecified: Secondary | ICD-10-CM | POA: Diagnosis not present

## 2023-12-19 DIAGNOSIS — Z79899 Other long term (current) drug therapy: Secondary | ICD-10-CM | POA: Diagnosis not present

## 2023-12-19 DIAGNOSIS — D500 Iron deficiency anemia secondary to blood loss (chronic): Secondary | ICD-10-CM

## 2023-12-19 MED ORDER — DIPHENHYDRAMINE HCL 25 MG PO CAPS
50.0000 mg | ORAL_CAPSULE | Freq: Once | ORAL | Status: AC
  Administered 2023-12-19: 50 mg via ORAL
  Filled 2023-12-19: qty 2

## 2023-12-19 MED ORDER — ACETAMINOPHEN 325 MG PO TABS
650.0000 mg | ORAL_TABLET | Freq: Once | ORAL | Status: AC
  Administered 2023-12-19: 650 mg via ORAL
  Filled 2023-12-19: qty 2

## 2023-12-19 MED ORDER — SODIUM CHLORIDE 0.9 % IV SOLN
300.0000 mg | Freq: Once | INTRAVENOUS | Status: AC
  Administered 2023-12-19: 300 mg via INTRAVENOUS
  Filled 2023-12-19: qty 5

## 2023-12-19 MED ORDER — SODIUM CHLORIDE 0.9 % IV SOLN: INTRAVENOUS | Status: DC

## 2023-12-19 NOTE — Patient Instructions (Signed)

## 2023-12-20 ENCOUNTER — Encounter: Payer: Self-pay | Admitting: Pharmacist

## 2023-12-20 NOTE — Progress Notes (Signed)
 error

## 2023-12-23 ENCOUNTER — Inpatient Hospital Stay

## 2023-12-26 ENCOUNTER — Telehealth: Payer: Self-pay | Admitting: Pharmacist

## 2023-12-26 DIAGNOSIS — E1165 Type 2 diabetes mellitus with hyperglycemia: Secondary | ICD-10-CM

## 2023-12-26 NOTE — Progress Notes (Signed)
   12/26/2023  Patient ID: [name] Payment, female   DOB: [birth year], 78 y.o.   MRN: [id number]  Patient was called as she was/is on the [facility] diabetes report.  Unfortunately, she did not answer the phone. HIPAA compliant message was left on her voicemail.   HgA1c-8.3% upcoming appointment 01/14/24  Call Patient back in 3-5 business days.  If I cannot reach her by phone prior to her 01/14/24 appointment, set up with provider to see Patient in the clinic.  Cassius DOROTHA Brought, PharmD, BCACP Clinical Pharmacist [phone]

## 2023-12-27 NOTE — Progress Notes (Deleted)
 [facility]   MULTIDISCIPLINARY HEART VALVE CLINIC                                     Cardiology Office Note:    Date:  12/27/2023   ID:  [name], DOB June 02, 1946, MRN [id number]  PCP:  [name] Medici, MD  [facility] Cardiologist:  [name] Clay, MD  [facility] Structural heart: [name] Fell, MD [facility] Electrophysiologist:  None   Referring MD: [name] Medici, MD   1 year s/p ASD closure.   History of Present Illness:    [name] is a 78 y.o. female with a hx of pulmonary HTN, HFpEF but RV failure, OSA, GI bleeding with chronic anemia, CVA, seizure d/o, persistent atrial flutter on Eliquis , and ASD s/p percutaneous ASD closure (01/02/23) who presents to clinic for follow up.    [name] is followed closely by Dr. Rolan for her cardiology care. She was diagnosed (07/2022) with atrial flutter and underwent a TEE/DCCV. At that time she was found to have an atrial septal defect with continuous left-to-right flow.and mild RVE. RHC 09/07/22 showed mean RA pressure of 6, pulmonary pressure 57/12 with a mean of 31, and a pulmonary capillary wedge pressure of 13. There was a significant step up from the SVC to the PA with oxygen  saturations of 64% and 77%, respectively. The patient's QP/QS 1.5/1. S/p successful ASD closure with 16mm Amplatzer septal occluder device without complication. She was restarted on Eliquis  monotherapy and ASA was discontinued. Follow up 7/202424, found to be back in AFL, rate controlled. Amiodarone  started, and underwent DCCV 02/04/23 to NSR. Repeated limited echo with bubble 01/2023 showed EF 60-65%, G2DD, normal RV, PASP 44.5 mmhg, no evidence of shunting, IVC dilated.   Today the patient presents to clinic for follow up.    Past Medical History:  Diagnosis Date   Abnormal liver function     in the past.   Anemia    Arthritis    all over   Bruises easily    Cataract    right eye;immature   CHF (congestive heart  failure) (HCC)    Chronic back pain    stenosis   Chronic cough    Chronic kidney disease    COPD (chronic obstructive pulmonary disease) (HCC)    COVID    aug/sept 2021   Demand myocardial infarction [facility]) 2012   Demand Infarction in setting of Pancreatitis --> mild Troponin elevation, NON-OBSTRUCTIVE CAD   Depression    takes Abilify  daily as well as Zoloft    Diastolic heart failure 2010   Grade 1 diastolic Dysfunction by Echo    Diverticulosis    DM (diabetes mellitus) (HCC)    takes Victoza  daily as well as Lantus  and Humalog    Empty sella (HCC)    on MRI in 2009.   Glaucoma    Headache(784.0)    last migraine-4-75yrs ago   History of blood transfusion    no abnormal reaction noted   History of colon polyps    benign   HTN (hypertension)    takes Benicar ,Imdur ,and Bystolic  daily   Hyperlipidemia    takes Lipitor daily   Joint swelling    Nocturia    Obesity hypoventilation syndrome (HCC)    Obstructive sleep apnea 02/2018   Notably improved split-night study with weight loss from 270 (2017) down to 250 pounds (2019).   Pancreatitis  takes Pancrelipase  daily   Parkinson's disease (HCC)    takes Sinemet  daily   Peripheral neuropathy    Pneumonia 2012   Seizures (HCC)    takes Lamictal  daily and Primidone  nightly;last seizure 2wks ago   Urinary urgency    With increased frequency   Varicose veins of both lower extremities with pain    With edema.  Takes daily Lasix      Current Medications: No outpatient medications have been marked as taking for the 12/30/23 encounter (Appointment) with [name] [name] SAUNDERS, PA-C.      ROS:   Please see the history of present illness.    All other systems reviewed and are negative.  EKGs       Risk Assessment/Calculations:   {Does this patient have ATRIAL FIBRILLATION?:[phone]}   {This patient may be at risk for Amyloid. She has one or more dx on the prob list or PMH from the following list -  Abnormal EKG,  HFpEF/Diastolic CHF, Aortic Stenosis, LVH, Bilateral Carpal Tunnel Syndrome, Biceps Tendon Rupture, Spinal Stenosis, Pericardial Effusion, Left Atrial Enlargement, Conduction System Disorder. See list below or review PMH.  Diagnoses From Problem List           Noted     Chronic diastolic CHF (congestive heart failure), NYHA class 2 (HCC) Unknown     Hypertensive heart disease with chronic diastolic congestive heart failure (HCC) 10/28/2011     Spinal stenosis at L4-L5 level 10/04/2014     Syncope 02/24/2019    Click HERE to open Cardiac Amyloid Screening SmartSet to order screening OR Click HERE to defer testing for 1 year or permanently :1}    Physical Exam:    VS:  There were no vitals taken for this visit.    Wt Readings from Last 3 Encounters:  12/05/23 183 lb 8 oz (83.2 kg)  11/15/23 182 lb (82.6 kg)  10/23/23 177 lb (80.3 kg)     GEN: Well nourished, well developed in no acute distress NECK: No JVD CARDIAC: ***RRR, no murmurs, rubs, gallops RESPIRATORY:  Clear to auscultation without rales, wheezing or rhonchi  ABDOMEN: Soft, non-tender, non-distended EXTREMITIES:  No edema; No deformity.  ASSESSMENT:    1. S/P percutaneous closure of atrial septal defect     PLAN:    In order of problems listed above:  Segundum ASD s/p percutaneous ASD closure: --         Medication Adjustments/Labs and Tests Ordered: Current medicines are reviewed at length with the patient today.  Concerns regarding medicines are outlined above.  No orders of the defined types were placed in this encounter.  No orders of the defined types were placed in this encounter.   There are no Patient Instructions on file for this visit.   Signed, [name] Sebastian, PA-C  12/27/2023 12:47 PM    [facility]

## 2023-12-30 ENCOUNTER — Ambulatory Visit (HOSPITAL_COMMUNITY): Payer: Medicare HMO

## 2023-12-30 ENCOUNTER — Ambulatory Visit: Payer: Medicare HMO | Admitting: Physician Assistant

## 2023-12-30 DIAGNOSIS — Z9889 Other specified postprocedural states: Secondary | ICD-10-CM

## 2023-12-31 ENCOUNTER — Other Ambulatory Visit: Payer: Self-pay | Admitting: Physician Assistant

## 2023-12-31 DIAGNOSIS — Z9889 Other specified postprocedural states: Secondary | ICD-10-CM

## 2024-01-01 ENCOUNTER — Telehealth: Payer: Self-pay | Admitting: Medical Oncology

## 2024-01-01 ENCOUNTER — Inpatient Hospital Stay

## 2024-01-01 NOTE — Telephone Encounter (Signed)
 Confirming appt cancelled today .

## 2024-01-14 ENCOUNTER — Ambulatory Visit (INDEPENDENT_AMBULATORY_CARE_PROVIDER_SITE_OTHER): Admitting: Internal Medicine

## 2024-01-14 ENCOUNTER — Encounter: Payer: Self-pay | Admitting: Internal Medicine

## 2024-01-14 VITALS — BP 120/70 | HR 73 | Temp 98.5°F | Ht 61.0 in | Wt 177.6 lb

## 2024-01-14 DIAGNOSIS — N1831 Chronic kidney disease, stage 3a: Secondary | ICD-10-CM

## 2024-01-14 DIAGNOSIS — I130 Hypertensive heart and chronic kidney disease with heart failure and stage 1 through stage 4 chronic kidney disease, or unspecified chronic kidney disease: Secondary | ICD-10-CM | POA: Diagnosis not present

## 2024-01-14 DIAGNOSIS — E66811 Obesity, class 1: Secondary | ICD-10-CM

## 2024-01-14 DIAGNOSIS — G4733 Obstructive sleep apnea (adult) (pediatric): Secondary | ICD-10-CM | POA: Diagnosis not present

## 2024-01-14 DIAGNOSIS — Z794 Long term (current) use of insulin: Secondary | ICD-10-CM | POA: Diagnosis not present

## 2024-01-14 DIAGNOSIS — I272 Pulmonary hypertension, unspecified: Secondary | ICD-10-CM

## 2024-01-14 DIAGNOSIS — Z6833 Body mass index (BMI) 33.0-33.9, adult: Secondary | ICD-10-CM

## 2024-01-14 DIAGNOSIS — E1122 Type 2 diabetes mellitus with diabetic chronic kidney disease: Secondary | ICD-10-CM

## 2024-01-14 DIAGNOSIS — I5032 Chronic diastolic (congestive) heart failure: Secondary | ICD-10-CM | POA: Diagnosis not present

## 2024-01-14 DIAGNOSIS — Z95811 Presence of heart assist device: Secondary | ICD-10-CM

## 2024-01-14 DIAGNOSIS — E6609 Other obesity due to excess calories: Secondary | ICD-10-CM | POA: Diagnosis not present

## 2024-01-14 DIAGNOSIS — J8410 Pulmonary fibrosis, unspecified: Secondary | ICD-10-CM

## 2024-01-14 MED ORDER — INSULIN LISPRO (1 UNIT DIAL) 100 UNIT/ML (KWIKPEN)
0.0000 [IU] | PEN_INJECTOR | SUBCUTANEOUS | 2 refills | Status: DC
Start: 2024-01-14 — End: 2024-04-01

## 2024-01-14 NOTE — Assessment & Plan Note (Signed)
 She is encouraged to strive for BMI less than 30 to decrease cardiac risk. Advised to aim for at least 150 minutes of exercise per week.

## 2024-01-14 NOTE — Assessment & Plan Note (Signed)
 Chronic, controlled.  She will continue with current meds including carvedilol  12.5mg  twice daily, isosorbide  mononitrate 30mg  daily, furosemide  80mg  twice daily and hydralazine  100mg  po tid Importance of dietary compliance was discussed with the patient.

## 2024-01-14 NOTE — Progress Notes (Signed)
 I,[name], CMA,acting as a Neurosurgeon for [name] LOISE Slocumb, MD.,have documented all relevant documentation on the behalf of [name] LOISE Slocumb, MD,as directed by  [name] LOISE Slocumb, MD while in the presence of [name] LOISE Slocumb, MD.  Subjective:  Patient ID: [name] , female    DOB: 05/27/1946 , 78 y.o.   MRN: [id number]  Chief Complaint  Patient presents with   Diabetes    Patient presents today for dm & bpc. She reports compliance with medications. Denies headache, chest pain & sob. Letter sent to Dr [name] for DM eye exam.   Hypertension    HPI Discussed the use of AI scribe software for clinical note transcription with the patient, who gave verbal consent to proceed.  History of Present Illness [name] is a 78 year old female with diabetes and hypertension who presents for a diabetes and blood pressure check.  She has experienced fluctuations in her blood sugar levels, including a recent episode of hypoglycemia with a blood sugar level of 31 mg/dL occurring almost in the middle of the night after dinner. Vision changes and sweating prompted her to check her blood sugar, and her nephew provided an oatmeal cookie to help raise it. She is unsure of the cause of the hypoglycemia. She has a good appetite and is actively trying to lose weight by walking and reducing food intake, resulting in a recent weight loss of six pounds.  She is currently using several medications for diabetes and hypertension, including Toujeo  20 units at night, reduced from 36 units. She uses Zegalogue  for hypoglycemic episodes and keeps it refrigerated. Her blood sugar levels have been as high as 217 mg/dL and over 699 mg/dL on different mornings.  She has a history of pulmonary hypertension and has a CardioMEMS device implanted to monitor her condition. She feels better with the device. She has missed recent cardiology appointments and an echocardiogram scheduled for July 8th. Her last echocardiogram in  August 2024 was reviewed by her doctor, who discussed the results with her.  She uses a CPAP machine at night but admits to not always using it as intended. She takes several medications for her heart condition, including amiodarone  once daily, Eliquis  5 mg twice daily, atorvastatin  40 mg daily, carvedilol  12.5 mg twice daily, hydralazine  three times a day, Lasix  20 mg four tablets twice a day, Imdur  30 mg daily, and Ozempic  1 mg weekly.  She lives with [name], her sister, who has been diagnosed with early-stage dementia. She often retreats to her room to avoid tension and prefers the warmth there, as the rest of the house is kept cooler by [name]'s children. She finds companionship in her dog, who she talks to and finds comfort in.   Diabetes She presents for her follow-up diabetic visit. She has type 2 diabetes mellitus. Her disease course has been improving. There are no hypoglycemic associated symptoms. Pertinent negatives for diabetes include no blurred vision, no chest pain, no polydipsia, no polyphagia and no polyuria. There are no hypoglycemic complications. Diabetic complications include nephropathy. Risk factors for coronary artery disease include diabetes mellitus, dyslipidemia, hypertension, obesity, post-menopausal and sedentary lifestyle. She is compliant with treatment some of the time. Her weight is fluctuating minimally. She is following a generally healthy diet. She never participates in exercise. Her home blood glucose trend is decreasing steadily. Her breakfast blood glucose is taken between 8-9 am. Her breakfast blood glucose range is generally 130-140 mg/dl. An ACE inhibitor/angiotensin II receptor blocker  is being taken. Eye exam is not current.  Hypertension This is a chronic problem. The current episode started more than 1 year ago. The problem has been gradually improving since onset. The problem is controlled. Pertinent negatives include no blurred vision, chest pain or  palpitations. The current treatment provides moderate improvement. Hypertensive end-organ damage includes kidney disease. Identifiable causes of hypertension include sleep apnea.     Past Medical History:  Diagnosis Date   Abnormal liver function     in the past.   Anemia    Arthritis    all over   Bruises easily    Cataract    right eye;immature   CHF (congestive heart failure) (HCC)    Chronic back pain    stenosis   Chronic cough    Chronic kidney disease    COPD (chronic obstructive pulmonary disease) (HCC)    COVID    aug/sept 2021   Demand myocardial infarction [facility]) 2012   Demand Infarction in setting of Pancreatitis --> mild Troponin elevation, NON-OBSTRUCTIVE CAD   Depression    takes Abilify  daily as well as Zoloft    Diastolic heart failure 2010   Grade 1 diastolic Dysfunction by Echo    Diverticulosis    DM (diabetes mellitus) (HCC)    takes Victoza  daily as well as Lantus  and Humalog    Empty sella (HCC)    on MRI in 2009.   Glaucoma    Headache(784.0)    last migraine-4-38yrs ago   History of blood transfusion    no abnormal reaction noted   History of colon polyps    benign   HTN (hypertension)    takes Benicar ,Imdur ,and Bystolic  daily   Hyperlipidemia    takes Lipitor daily   Joint swelling    Nocturia    Obesity hypoventilation syndrome (HCC)    Obstructive sleep apnea 02/2018   Notably improved split-night study with weight loss from 270 (2017) down to 250 pounds (2019).   Pancreatitis    takes Pancrelipase  daily   Parkinson's disease (HCC)    takes Sinemet  daily   Peripheral neuropathy    Pneumonia 2012   Seizures (HCC)    takes Lamictal  daily and Primidone  nightly;last seizure 2wks ago   Urinary urgency    With increased frequency   Varicose veins of both lower extremities with pain    With edema.  Takes daily Lasix      Family History  Problem Relation Age of Onset   Allergies Father    Heart disease Father        before 45   Heart  failure Father    Hypertension Father    Hyperlipidemia Father    Heart disease Mother    Diabetes Mother    Hypertension Mother    Hyperlipidemia Mother    Hypertension Sister    Heart disease Sister        before 82   Hyperlipidemia Sister    Diabetes Brother    Hypertension Brother    Diabetes Sister    Hypertension Sister    Diabetes Son    Hypertension Son    Cancer Other      Current Outpatient Medications:    acetaminophen  (TYLENOL ) 500 MG tablet, Take 500-1,000 mg by mouth every 4 (four) hours as needed for moderate pain or mild pain., Disp: , Rfl:    albuterol  (VENTOLIN  HFA) 108 (90 Base) MCG/ACT inhaler, Inhale 1-2 puffs into the lungs every 6 (six) hours as needed for wheezing or shortness  of breath., Disp: 17 each, Rfl: 5   Alcaftadine (LASTACAFT) 0.25 % SOLN, Place 1-2 drops into both eyes daily as needed (allergy eye/irritation.)., Disp: , Rfl:    Alcohol  Swabs (DROPSAFE ALCOHOL  PREP) 70 % PADS, USE TWO TIMES DAILY AS DIRECTED, Disp: 200 each, Rfl: 3   amiodarone  (PACERONE ) 100 MG tablet, TAKE 1 TABLET EVERY DAY, Disp: 90 tablet, Rfl: 3   apixaban  (ELIQUIS ) 5 MG TABS tablet, Take 1 tablet (5 mg total) by mouth 2 (two) times daily., Disp: 180 tablet, Rfl: 3   Ascorbic Acid (VITAMIN C) 1000 MG tablet, Take 1,000 mg by mouth in the morning., Disp: , Rfl:    atorvastatin  (LIPITOR) 40 MG tablet, TAKE 1 TABLET EVERY DAY, Disp: 90 tablet, Rfl: 3   B-D ULTRAFINE III SHORT PEN 31G X 8 MM MISC, USE DAILY WITH VICTOZA  AND/OR NOVOLOG ., Disp: 400 each, Rfl: 2   benzonatate  (TESSALON ) 100 MG capsule, Take 1 capsule by mouth three times daily as needed for cough, Disp: 30 capsule, Rfl: 0   Blood Glucose Monitoring Suppl (TRUE METRIX AIR GLUCOSE METER) w/Device KIT, USE AS DIRECTED, Disp: 1 kit, Rfl: 3   carvedilol  (COREG ) 12.5 MG tablet, TAKE 1 TABLET TWICE DAILY, Disp: 180 tablet, Rfl: 3   Dasiglucagon  HCl (ZEGALOGUE ) 0.6 MG/0.6ML SOAJ, Inject 0.6 mg into the skin daily as needed  (use for extremely low blood sugars, less than 60)., Disp: 0.6 mL, Rfl: 3   diclofenac  sodium (VOLTAREN ) 1 % GEL, Apply 2 g topically 4 (four) times daily as needed (pain)., Disp: , Rfl:    FeFum-FePoly-FA-B Cmp-C-Biot (INTEGRA PLUS ) CAPS, Take 1 capsule by mouth every morning., Disp: 30 capsule, Rfl: 2   furosemide  (LASIX ) 20 MG tablet, Take 4 tablets (80 mg total) by mouth twice a day., Disp: 720 tablet, Rfl: 3   hydrALAZINE  (APRESOLINE ) 100 MG tablet, Take 1 tablet (100 mg total) by mouth 3 (three) times daily., Disp: 270 tablet, Rfl: 3   hydrocortisone cream 1 %, Apply 1 application topically daily as needed for itching. , Disp: , Rfl:    insulin  glargine, 1 Unit Dial , (TOUJEO  SOLOSTAR) 300 UNIT/ML Solostar Pen, INJECT 36 UNITS UNDER THE SKIN AT BEDTIME (Patient taking differently: 20 Units.), Disp: 9 mL, Rfl: 3   Isopropyl Alcohol  (ALCOHOL  WIPES) 70 % MISC, Apply 1 each topically 2 (two) times daily., Disp: 300 each, Rfl: 3   isosorbide  mononitrate (IMDUR ) 30 MG 24 hr tablet, TAKE 1 TABLET EVERY DAY, Disp: 90 tablet, Rfl: 2   lamoTRIgine  (LAMICTAL ) 200 MG tablet, Take 1 tablet (200 mg total) by mouth 2 (two) times daily., Disp: 180 tablet, Rfl: 3   levETIRAcetam  (KEPPRA ) 500 MG tablet, Take 2 tablets every night, Disp: 180 tablet, Rfl: 3   loratadine  (CLARITIN ) 10 MG tablet, Take 1 tablet (10 mg total) by mouth daily., Disp: 30 tablet, Rfl: 2   Multiple Vitamin (MULTIVITAMIN) tablet, Take 1 tablet by mouth daily., Disp: , Rfl:    omeprazole (PRILOSEC) 10 MG capsule, Take 10 mg by mouth every Monday, Wednesday, and Friday., Disp: , Rfl:    OZEMPIC , 1 MG/DOSE, 4 MG/3ML SOPN, INJECT 1MG  UNDER THE SKIN ONE TIME WEEKLY ON WEDNESDAYS, Disp: 9 mL, Rfl: 3   Pancrelipase , Lip-Prot-Amyl, 25000-79000 units CPEP, Take 2 capsules by mouth in the morning, at noon, and at bedtime., Disp: , Rfl:    potassium chloride  SA (KLOR-CON  M) 20 MEQ tablet, Take 3 tablets (60 mEq total) by mouth daily., Disp: 300 tablet,  Rfl: 3  sennosides-docusate sodium  (SENOKOT-S) 8.6-50 MG tablet, Take 1-2 tablets by mouth at bedtime as needed for constipation., Disp: , Rfl:    sertraline  (ZOLOFT ) 50 MG tablet, TAKE 1 TABLET EVERY DAY, Disp: 90 tablet, Rfl: 3   tiZANidine  (ZANAFLEX ) 2 MG tablet, TAKE 1 TABLET AT BEDTIME AS NEEDED, Disp: 30 tablet, Rfl: 11   tolnaftate (TINACTIN) 1 % cream, Apply 1 application topically daily as needed (foot fungus). , Disp: , Rfl:    TRUE METRIX BLOOD GLUCOSE TEST test strip, USE AS DIRECTED TO CHECK BLOOD SUGARS 2 TIMES PER DAY, Disp: 200 strip, Rfl: 3   TRUEplus Lancets 33G MISC, TEST BLOOD SUGAR TWICE DAILY AS DIRECTED, Disp: 200 each, Rfl: 3   insulin  lispro (HUMALOG  KWIKPEN) 100 UNIT/ML KwikPen, Inject 0-10 Units into the skin See admin instructions. Sliding scale:Inject  (high blood sugar over 150). 150-199=2 units;200-249=4 units,250-299=6 units;300-349=8 units;350 above 10 units: Over 350 call physician, Disp: 15 mL, Rfl: 2   oxyCODONE  (OXY IR/ROXICODONE ) 5 MG immediate release tablet, Take 5 mg by mouth every 6 (six) hours as needed (pain.). (Patient not taking: Reported on 01/14/2024), Disp: , Rfl:  No current facility-administered medications for this visit.  Facility-Administered Medications Ordered in Other Visits:    0.9 %  sodium chloride  infusion (Manually program via Guardrails IV Fluids), , Intravenous, Once, [place], Jessica M, FNP   0.9 %  sodium chloride  infusion (Manually program via Guardrails IV Fluids), , Intravenous, Once, [place], Jessica M, FNP   Allergies  Allergen Reactions   Other Anaphylaxis and Rash    Bleach Cigarette smoke/ Cough   Penicillins Hives    Did it involve swelling of the face/tongue/throat, SOB, or low BP? No Did it involve sudden or severe rash/hives, skin peeling, or any reaction on the inside of your mouth or nose? Yes Did you need to seek medical attention at a hospital or doctor's office? Yes When did it last happen?      Childhood  allergy  If all above answers are "NO", may proceed with cephalosporin use.     Sulfa Antibiotics Hives   Aspirin  Other (See Comments)     On aspirin  81 mg - Rectal bleeding in dec 2018   Codeine     Headache and makes the patient feel off     Review of Systems  Constitutional: Negative.   Eyes:  Negative for blurred vision.  Respiratory: Negative.    Cardiovascular: Negative.  Negative for chest pain and palpitations.  Endocrine: Negative for polydipsia, polyphagia and polyuria.  Neurological: Negative.   Psychiatric/Behavioral: Negative.       Today's Vitals   01/14/24 1011  BP: 120/70  Pulse: 73  Temp: 98.5 F (36.9 C)  SpO2: 98%  Weight: 177 lb 9.6 oz (80.6 kg)  Height: 5' 1 (1.549 m)   Body mass index is 33.56 kg/m.  Wt Readings from Last 3 Encounters:  01/14/24 177 lb 9.6 oz (80.6 kg)  12/05/23 183 lb 8 oz (83.2 kg)  11/15/23 182 lb (82.6 kg)     Objective:  Physical Exam Vitals and nursing note reviewed.  Constitutional:      Appearance: Normal appearance.  HENT:     Head: Normocephalic and atraumatic.  Eyes:     Extraocular Movements: Extraocular movements intact.  Cardiovascular:     Rate and Rhythm: Normal rate and regular rhythm.     Heart sounds: Normal heart sounds.  Pulmonary:     Effort: Pulmonary effort is normal.  Breath sounds: Normal breath sounds.  Musculoskeletal:     Comments: Ambulatory with cane  Skin:    General: Skin is warm.  Neurological:     General: No focal deficit present.     Mental Status: She is alert.  Psychiatric:        Mood and Affect: Mood normal.        Behavior: Behavior normal.       Assessment And Plan:  Type 2 diabetes mellitus with stage 3a chronic kidney disease, with long-term current use of insulin  (HCC) Assessment & Plan: Fluctuating blood glucose with recent nocturnal hypoglycemia. Uses Humalog  as rescue insulin . Regarding CKD, she is reminded to avoid NSAIDs and stay well hydrated.  -  Adjust Toujeo  dosage if hyperglycemia persists. - Resend Humalog  prescription with refills. - Reminded to use Humalog  BEFORE meals and NOT at bedtime  Orders: -     PTH, intact and calcium  -     Phosphorus -     Protein electrophoresis, serum -     Insulin  Lispro (1 Unit Dial ); Inject 0-10 Units into the skin See admin instructions. Sliding scale:Inject  (high blood sugar over 150). 150-199=2 units;200-249=4 units,250-299=6 units;300-349=8 units;350 above 10 units: Over 350 call physician  Dispense: 15 mL; Refill: 2  Hypertensive heart and renal disease with renal failure, stage 1 through stage 4 or unspecified chronic kidney disease, with heart failure (HCC) Assessment & Plan: Chronic, controlled.  She will continue with current meds including carvedilol  12.5mg  twice daily, isosorbide  mononitrate 30mg  daily, furosemide  80mg  twice daily and hydralazine  100mg  po tid Importance of dietary compliance was discussed with the patient.    Chronic diastolic CHF (congestive heart failure), NYHA class 2 (HCC) Assessment & Plan: Chronic, appears stable. Last echo was in Importance of dietary/medication compliance was discussed with the patient. She is encouraged to gradually increase her daily activity.    OSA on CPAP Assessment & Plan: Admits non-compliance with CPAP usage. - She is encouraged to wear at least 4 hours per night - Advised to put on CPAP while watching TV prior to going to sleep at night   Pulmonary hypertension, unspecified (HCC) Assessment & Plan: Improvement with CardioMEMS device. - Also encouraged to comply with CPAP use.   Class 1 obesity due to excess calories with serious comorbidity and body mass index (BMI) of 33.0 to 33.9 in adult Assessment & Plan: She is encouraged to strive for BMI less than 30 to decrease cardiac risk. Advised to aim for at least 150 minutes of exercise per week.    Presence of heart assist device [facility]) Assessment & Plan: Cardiomems was  placed in 5/21.   Return in 3 months (on 04/15/2024), or dm check.  Patient was given opportunity to ask questions. Patient verbalized understanding of the plan and was able to repeat key elements of the plan. All questions were answered to their satisfaction.   I, [name] LOISE Slocumb, MD, have reviewed all documentation for this visit. The documentation on 01/14/24 for the exam, diagnosis, procedures, and orders are all accurate and complete.   IF YOU HAVE BEEN REFERRED TO A SPECIALIST, IT MAY TAKE 1-2 WEEKS TO SCHEDULE/PROCESS THE REFERRAL. IF YOU HAVE NOT HEARD FROM US /SPECIALIST IN TWO WEEKS, PLEASE GIVE US  A CALL AT [phone] X [phone].   THE PATIENT IS ENCOURAGED TO PRACTICE SOCIAL DISTANCING DUE TO THE COVID-19 PANDEMIC.

## 2024-01-14 NOTE — Patient Instructions (Signed)

## 2024-01-14 NOTE — Assessment & Plan Note (Signed)
 Chronic, appears stable. Last echo was in Importance of dietary/medication compliance was discussed with the patient. She is encouraged to gradually increase her daily activity.

## 2024-01-15 LAB — PROTEIN ELECTROPHORESIS, SERUM
A/G Ratio: 1.2 (ref 0.7–1.7)
Albumin ELP: 3.8 g/dL (ref 2.9–4.4)
Alpha 1: 0.2 g/dL (ref 0.0–0.4)
Alpha 2: 0.5 g/dL (ref 0.4–1.0)
Beta: 1 g/dL (ref 0.7–1.3)
Gamma Globulin: 1.5 g/dL (ref 0.4–1.8)
Globulin, Total: 3.3 g/dL (ref 2.2–3.9)
Total Protein: 7.1 g/dL (ref 6.0–8.5)

## 2024-01-15 LAB — PHOSPHORUS: Phosphorus: 3.8 mg/dL (ref 3.0–4.3)

## 2024-01-15 LAB — PTH, INTACT AND CALCIUM
Calcium: 9 mg/dL (ref 8.7–10.3)
PTH: 55 pg/mL (ref 15–65)

## 2024-01-20 ENCOUNTER — Ambulatory Visit: Payer: Self-pay | Admitting: Internal Medicine

## 2024-01-20 DIAGNOSIS — Z794 Long term (current) use of insulin: Secondary | ICD-10-CM

## 2024-01-23 ENCOUNTER — Telehealth: Payer: Self-pay | Admitting: Internal Medicine

## 2024-01-23 ENCOUNTER — Other Ambulatory Visit

## 2024-01-23 DIAGNOSIS — Z794 Long term (current) use of insulin: Secondary | ICD-10-CM | POA: Diagnosis not present

## 2024-01-23 DIAGNOSIS — N1831 Chronic kidney disease, stage 3a: Secondary | ICD-10-CM | POA: Diagnosis not present

## 2024-01-23 DIAGNOSIS — E1122 Type 2 diabetes mellitus with diabetic chronic kidney disease: Secondary | ICD-10-CM | POA: Diagnosis not present

## 2024-01-23 NOTE — Telephone Encounter (Signed)
 Left the patient a voicemail with the rescheduled appointment details.

## 2024-01-24 LAB — HEMOGLOBIN A1C
Est. average glucose Bld gHb Est-mCnc: 163 mg/dL
Hgb A1c MFr Bld: 7.3 % — ABNORMAL HIGH (ref 4.8–5.6)

## 2024-01-26 ENCOUNTER — Ambulatory Visit: Payer: Self-pay | Admitting: Internal Medicine

## 2024-01-26 NOTE — Assessment & Plan Note (Signed)
 Cardiomems was placed in 5/21.

## 2024-01-26 NOTE — Assessment & Plan Note (Signed)
 Improvement with CardioMEMS device. - Also encouraged to comply with CPAP use.

## 2024-01-26 NOTE — Assessment & Plan Note (Addendum)
 Fluctuating blood glucose with recent nocturnal hypoglycemia. Uses Humalog  as rescue insulin . Regarding CKD, she is reminded to avoid NSAIDs and stay well hydrated.  - Adjust Toujeo  dosage if hyperglycemia persists. - Resend Humalog  prescription with refills. - Reminded to use Humalog  BEFORE meals and NOT at bedtime

## 2024-01-26 NOTE — Assessment & Plan Note (Signed)
 Admits non-compliance with CPAP usage. - She is encouraged to wear at least 4 hours per night - Advised to put on CPAP while watching TV prior to going to sleep at night

## 2024-02-07 ENCOUNTER — Encounter (HOSPITAL_COMMUNITY): Payer: Self-pay | Admitting: Cardiology

## 2024-02-07 ENCOUNTER — Ambulatory Visit (HOSPITAL_COMMUNITY): Admission: RE | Admit: 2024-02-07 | Source: Ambulatory Visit

## 2024-02-07 ENCOUNTER — Ambulatory Visit (HOSPITAL_COMMUNITY)
Admission: RE | Admit: 2024-02-07 | Discharge: 2024-02-07 | Disposition: A | Discharge status: Home / Self Care | Source: Ambulatory Visit | Attending: Cardiology | Admitting: Cardiology

## 2024-02-07 ENCOUNTER — Ambulatory Visit (HOSPITAL_COMMUNITY): Payer: Self-pay | Admitting: Cardiology

## 2024-02-07 VITALS — BP 120/62 | HR 58 | Wt 181.8 lb

## 2024-02-07 DIAGNOSIS — J439 Emphysema, unspecified: Secondary | ICD-10-CM | POA: Diagnosis not present

## 2024-02-07 DIAGNOSIS — I480 Paroxysmal atrial fibrillation: Secondary | ICD-10-CM | POA: Diagnosis not present

## 2024-02-07 DIAGNOSIS — I110 Hypertensive heart disease with heart failure: Secondary | ICD-10-CM | POA: Diagnosis not present

## 2024-02-07 DIAGNOSIS — I4892 Unspecified atrial flutter: Secondary | ICD-10-CM | POA: Diagnosis not present

## 2024-02-07 DIAGNOSIS — E669 Obesity, unspecified: Secondary | ICD-10-CM | POA: Insufficient documentation

## 2024-02-07 DIAGNOSIS — I484 Atypical atrial flutter: Secondary | ICD-10-CM | POA: Insufficient documentation

## 2024-02-07 DIAGNOSIS — Q211 Atrial septal defect, unspecified: Secondary | ICD-10-CM | POA: Diagnosis not present

## 2024-02-07 DIAGNOSIS — I272 Pulmonary hypertension, unspecified: Secondary | ICD-10-CM | POA: Diagnosis not present

## 2024-02-07 DIAGNOSIS — M1990 Unspecified osteoarthritis, unspecified site: Secondary | ICD-10-CM | POA: Insufficient documentation

## 2024-02-07 DIAGNOSIS — Z8616 Personal history of COVID-19: Secondary | ICD-10-CM | POA: Insufficient documentation

## 2024-02-07 DIAGNOSIS — I5032 Chronic diastolic (congestive) heart failure: Secondary | ICD-10-CM | POA: Insufficient documentation

## 2024-02-07 DIAGNOSIS — Z7901 Long term (current) use of anticoagulants: Secondary | ICD-10-CM | POA: Insufficient documentation

## 2024-02-07 DIAGNOSIS — Z87891 Personal history of nicotine dependence: Secondary | ICD-10-CM | POA: Insufficient documentation

## 2024-02-07 DIAGNOSIS — G4733 Obstructive sleep apnea (adult) (pediatric): Secondary | ICD-10-CM | POA: Insufficient documentation

## 2024-02-07 DIAGNOSIS — Z79899 Other long term (current) drug therapy: Secondary | ICD-10-CM | POA: Insufficient documentation

## 2024-02-07 DIAGNOSIS — J8410 Pulmonary fibrosis, unspecified: Secondary | ICD-10-CM | POA: Insufficient documentation

## 2024-02-07 DIAGNOSIS — Z6834 Body mass index (BMI) 34.0-34.9, adult: Secondary | ICD-10-CM | POA: Diagnosis not present

## 2024-02-07 DIAGNOSIS — R079 Chest pain, unspecified: Secondary | ICD-10-CM | POA: Insufficient documentation

## 2024-02-07 DIAGNOSIS — Z8774 Personal history of (corrected) congenital malformations of heart and circulatory system: Secondary | ICD-10-CM | POA: Diagnosis not present

## 2024-02-07 LAB — COMPREHENSIVE METABOLIC PANEL WITH GFR
ALT: 19 U/L (ref 0–44)
AST: 23 U/L (ref 15–41)
Albumin: 3.5 g/dL (ref 3.5–5.0)
Alkaline Phosphatase: 104 U/L (ref 38–126)
Anion gap: 9 (ref 5–15)
BUN: 14 mg/dL (ref 8–23)
CO2: 30 mmol/L (ref 22–32)
Calcium: 8.6 mg/dL — ABNORMAL LOW (ref 8.9–10.3)
Chloride: 99 mmol/L (ref 98–111)
Creatinine, Ser: 0.97 mg/dL (ref 0.44–1.00)
GFR, Estimated: >60 mL/min (ref >60)
Glucose, Bld: 148 mg/dL — ABNORMAL HIGH (ref 70–99)
Potassium: 3.4 mmol/L — ABNORMAL LOW (ref 3.5–5.1)
Sodium: 138 mmol/L (ref 135–145)
Total Bilirubin: 0.6 mg/dL (ref 0.0–1.2)
Total Protein: 7.2 g/dL (ref 6.5–8.1)

## 2024-02-07 LAB — CBC
HCT: 29.9 % — ABNORMAL LOW (ref 36.0–46.0)
Hemoglobin: 9.4 g/dL — ABNORMAL LOW (ref 12.0–15.0)
MCH: 23.9 pg — ABNORMAL LOW (ref 26.0–34.0)
MCHC: 31.4 g/dL (ref 30.0–36.0)
MCV: 76.1 fL — ABNORMAL LOW (ref 80.0–100.0)
Platelets: 264 K/uL (ref 150–400)
RBC: 3.93 MIL/uL (ref 3.87–5.11)
RDW: 15.7 % — ABNORMAL HIGH (ref 11.5–15.5)
WBC: 3.7 K/uL — ABNORMAL LOW (ref 4.0–10.5)
nRBC: 0 % (ref 0.0–0.2)

## 2024-02-07 LAB — BRAIN NATRIURETIC PEPTIDE: B Natriuretic Peptide: 67.7 pg/mL (ref 0.0–100.0)

## 2024-02-07 LAB — TSH: TSH: 3.137 u[IU]/mL (ref 0.350–4.500)

## 2024-02-07 NOTE — Patient Instructions (Signed)
 Medication Changes:  INCREASE Furosemide  to 100 mg (5 tabs) Twice daily FOR 3 DAYS ONLY then back to 80 mg (4 tabs) Daily  Lab Work:  Labs done today, your results will be available in MyChart, we will contact you for abnormal readings.   Testing/Procedures:  Your physician has requested that you have an echocardiogram. Echocardiography is a painless test that uses sound waves to create images of your heart. It provides your doctor with information about the size and shape of your heart and how well your heart's chambers and valves are working. This procedure takes approximately one hour. There are no restrictions for this procedure. Please do NOT wear cologne, perfume, aftershave, or lotions (deodorant is allowed). Please arrive 15 minutes prior to your appointment time. You will be called to schedule this  Please note: We ask at that you not bring children with you during ultrasound (echo/ vascular) testing. Due to room size and safety concerns, children are not allowed in the ultrasound rooms during exams. Our front office staff cannot provide observation of children in our lobby area while testing is being conducted. An adult accompanying a patient to their appointment will only be allowed in the ultrasound room at the discretion of the ultrasound technician under special circumstances. We apologize for any inconvenience.   Special Instructions // Education:  Do the following things EVERYDAY: Weigh yourself in the morning before breakfast. Write it down and keep it in a log. Take your medicines as prescribed Eat low salt foods--Limit salt (sodium) to 2000 mg per day.  Stay as active as you can everyday Limit all fluids for the day to less than 2 liters    Follow-Up in: 4 months   At the [facility], you and your health needs are our priority. We have a designated team specialized in the treatment of Heart Failure. This Care Team includes your primary Heart  Failure Specialized Cardiologist (physician), Advanced Practice Providers (APPs- Physician Assistants and Nurse Practitioners), and Pharmacist who all work together to provide you with the care you need, when you need it.   You may see any of the following providers on your designated Care Team at your next follow up:  Dr. Toribio Fuel Dr. Ezra Shuck Dr. Ria Commander Dr. Odis Brownie Greig Mosses, NP [place]affie Shed, [place] [facility] [place], [place] Beckey Coe, NP [place] Lee, NP Tinnie Redman, PharmD   Please be sure to bring in all your medications bottles to every appointment.   Need to Contact Us :  If you have any questions or concerns before your next appointment please send us  a message through [place] or call our office at [phone].    TO LEAVE A MESSAGE FOR THE NURSE SELECT OPTION 2, PLEASE LEAVE A MESSAGE INCLUDING: YOUR NAME DATE OF BIRTH CALL BACK NUMBER REASON FOR CALL**this is important as we prioritize the call backs  YOU WILL RECEIVE A CALL BACK THE SAME DAY AS LONG AS YOU CALL BEFORE 4:00 PM

## 2024-02-07 NOTE — Telephone Encounter (Addendum)
 Reviewed foods that are higher in potassium with patient   ----- Message from Ezra Shuck sent at 02/07/2024  3:52 PM EDT ----- Mild hypokalemia, increase K in diet.  ----- Message ----- From: Rebeck[place], [facility] [place] Sent: 02/07/2024   1:13 PM EDT To: Ezra GORMAN Shuck, MD

## 2024-02-09 NOTE — Progress Notes (Signed)
 PCP: Jarold Medici, MD Cardiology: Dr. Anner HF Cardiology: Dr. Rolan  CC: CHF  78 y.o. with history of OHS/OSA and RV dysfunction was referred by Dr. Anner for evaluation of CHF.  She has a history of OSA/OHS.  She uses CPAP at night but not currently using oxygen  during the day.  Remote smoker.  She had an echo in 9/20 showing EF 50-55% but moderate-severe RV dysfunction with mild-moderate RV dilation.  RHC/LHC in 10/20 showed normal coronaries, pulmonary venous hypertension.  V/Q scan in 2/21 did not show evidence for acute on chronic PE.    Cardiomems was placed in 5/21.  RHC at that time showed normal filling pressures and moderate pulmonary hypertension but suspect due to high output.  No evidence for shunt lesion.  Echo showed EF 60-65%, enlarged RV with normal systolic function.   She was admitted in 5/21 with syncope, thought to be due to a seizure.    She had COVID-19 infection in 8/21. 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   Echo in 9/21 with EF 50-55%, severe RV enlargement and severely decreased RV systolic function, PASP 54 mmHg.    She was admitted in 5/22 with CHF, bronchitis.  She was diuresed and treated with antibiotics. Echo in 5/22 showed EF 60-65%, mild LVH, ?normal RV, severe LAE, ?normal PA pressure, IVC normal.  V/Q scan negative.   In 10/22, she was admitted with GI bleeding, thought to be diverticular.  BP meds were decreased and Lasix  was cut back.  With increased weight, she increased her Lasix  back to 80 mg daily.   She was seen in the ER in 1/24 with atypical atrial flutter, this was new.  She was started on apixaban  and sent home from ER. She was seen for follow up and planned to set her up for DCCV, but she was admitted on 07/25/22 with GI bleed, thought to be diverticular.  Apixaban  was held and she was tranfused. Apixaban  was restarted after discharge.   She did not have endoscopy.   Follow up 08/01/22, she remained in atypical atrial flutter. Arranged for TEE/DCCV.  TEE/DCCV showed EF 60-65%, mild LVH, RV normal, small ASD to L->R flow, no LAA thrombus. S/p DCCV --> NSR.  RHC (3/24) showed normal filling pressures, moderate pulmonary hypertension in setting of ASD, Qp/Qs 1.5/1. She was referred to Structural Heart team for ASD repair consideration.  S/p ASD repair 7/24. Ltd echo 7/24 showed EF 55-60%, RV function moderately reduced.  Follow up 7/24, found to be back in AFL, rate controlled. Amiodarone  started, and underwent DCCV 02/04/23 to NSR.  Ltd Echo with bubble study 8/24 showed EF 60-65%, G2DD, normal RV, PASP 44.5 mmhg, no evidence of shunting, IVC dilated.  Today she returns for HF follow up. She is short of breath walking about 100-200 feet chronically.  Occasional atypical (nonexertional) chest pain that is transient.  She chronically sleeps on 3 pillows.  Occasionally gets mildly lightheaded.  With standing.  No palpitations.  She is taking Lasix  80 mg bid. She is not using her CPAP regularly.  She has been getting sleepy during the day without it.    ECG (personally reviewed): NSR, 1st degree AVB, iRBBB  Cardiomems: PADP 21, goal 20  Labs (1/24): K 3.5, creatinine 0.92, BNP 167 Labs (2/24): transferrin saturation 7%, hgb 8.1 Labs (3/24): K 4.0, creatinine 0.99 Labs (7/24): K 3.5, creatinine 0.90 Labs (10/24): LFTs normal,  K 3.8, creatinine 0.97 Labs (11/24): TSH mildly elevated 4.7 Labs (12/24): hgb 9, K 3.8, creatinine 0.91, THS normal, LFTs normal Labs (4/25): K 2.8, creatinine 1.39  PMH: 1. OHS/OSA: She uses CPAP.  2. H/o CVA 3. Seizure disorder 4. Type 2 DM 5. Depression 6. HTN 7. Hyperlipidemia 8. Chronic diastolic CHF: Echo (9/20) with EF 50-55%, moderate to severe RV dilation with mild-moderately decreased RV systolic function.  - RHC/LHC (10/20): normal coronaries; mean RA 10, PA 56/14 mean 31, mean PCWP 13,  LVEDP 18, CO/CI 7.35/3.73, PVR 2.44.  - RHC/Cardiomems placement (5/21): mean RA 7, PA 58/21 mean 34, PCWP mean 12, CI 8.3 F/3.95 T, PVR 2.86 WU. No evidence for shunt lesion.  - Echo (5/21): EF 60-65%, RV enlarged with normal systolic function.  - Echo (9/21): EF 50-55%, severe RV enlargement and severely decreased RV systolic function, PASP 54 mmHg.  - Echo (5/22): EF 60-65%, mild LVH, ?normal RV, severe LAE, ?normal PA pressure, IVC normal. - TEE (2/23): EF 60-65%, mild LVH, RV normal - RHC (3/24): RA mean 6, PA 57/12 (mean 31), PCWP mean 13, CO/CI (Fick) 17/9.4, Qp/Qs 1.5/1 - Ltd Echo with bubble study (8/24):EF 60-65%, G2DD, normal RV, PASP 44.5 mmhg, moderately decreased RV systolic function, no evidence of shunting, IVC dilated. 9. Primarily pulmonary venous hypertension.  - V/Q scan (2/21, 5/22): No evidence for acute or chronic PE.  - PFTs normal in 5/21.  10. COVID-19 PNA 8/21.  - CT chest in 10/21 with emphysema and mild fibrosis concerning for post-infectious inflammatory fibrosis from COVID.  11. GI bleeding: 10/22, diverticular bleed.  - Presumed diverticular bleed in 1/24.  12. Atypical atrial flutter/atrial fibrillation: First noted 1/24.  - TEE/DCCV (2/24) to NSR - DCCV (8/24) to NSR 13. ASD: RHC (3/24) showed Qp/Qs 1.5/1, suspect hemodynamically significant ASD. - s/p ASD closure (7/24) - Ltd echo w/ bubble study (8/24) showed no evidence of shunting, stable well-seated ASD closure device.  Social History   Socioeconomic History   Marital status: Divorced    Spouse name: Not on file   Number of children: Not on file   Years of education: Not on file   Highest education level: Not on file  Occupational History   Occupation: Disabled    Comment: CNA  Tobacco Use   Smoking status: Former    Current packs/day: 0.50    Average packs/day: 0.5 packs/day for 25.0 years (12.5 ttl pk-yrs)    Types: Cigarettes   Smokeless tobacco: Never   Tobacco comments:    quit  smoking 20+ytrs ago  Vaping Use   Vaping status: Never Used  Substance and Sexual Activity   Alcohol  use: No   Drug use: No   Sexual activity: Not Currently    Birth control/protection: Surgical  Other Topics Concern   Not on file  Social History Narrative   She lives in [place]. She has lots of family in the area and is accompanied by her sister.   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   She has a distant history of smoking, quit 20 years ago.   Right handed    Social Drivers of Health   Financial Resource Strain: Low Risk  (12/04/2023)   Overall Financial Resource Strain (CARDIA)    Difficulty of Paying Living Expenses: Not hard at all  Food Insecurity: No Food Insecurity (12/04/2023)   Hunger Vital Sign    Worried About Running Out of Food in the Last Year:  Never true    Ran Out of Food in the Last Year: Never true  Transportation Needs: No Transportation Needs (12/04/2023)   PRAPARE - Administrator, Civil Service (Medical): No    Lack of Transportation (Non-Medical): No  Physical Activity: Insufficiently Active (12/04/2023)   Exercise Vital Sign    Days of Exercise per Week: 2 days    Minutes of Exercise per Session: 20 min  Stress: Stress Concern Present (12/04/2023)   [employer] of Occupational Health - Occupational Stress Questionnaire    Feeling of Stress : To some extent  Social Connections: Moderately Isolated (12/04/2023)   Social Connection and Isolation Panel    Frequency of Communication with Friends and Family: More than three times a week    Frequency of Social Gatherings with Friends and Family: Once a week    Attends Religious Services: More than 4 times per year    Active Member of [employer] or Organizations: No    Attends Banker Meetings: Never    Marital Status: Divorced  Catering manager Violence: Not At Risk (12/04/2023)   Humiliation, Afraid, Rape, and Kick questionnaire    Fear of  Current or Ex-Partner: No    Emotionally Abused: No    Physically Abused: No    Sexually Abused: No   Family History  Problem Relation Age of Onset   Allergies Father    Heart disease Father        before 33   Heart failure Father    Hypertension Father    Hyperlipidemia Father    Heart disease Mother    Diabetes Mother    Hypertension Mother    Hyperlipidemia Mother    Hypertension Sister    Heart disease Sister        before 80   Hyperlipidemia Sister    Diabetes Brother    Hypertension Brother    Diabetes Sister    Hypertension Sister    Diabetes Son    Hypertension Son    Cancer Other    Current Outpatient Medications  Medication Sig Dispense Refill   acetaminophen  (TYLENOL ) 500 MG tablet Take 500-1,000 mg by mouth every 4 (four) hours as needed for moderate pain or mild pain.     albuterol  (VENTOLIN  HFA) 108 (90 Base) MCG/ACT inhaler Inhale 1-2 puffs into the lungs every 6 (six) hours as needed for wheezing or shortness of breath. 17 each 5   Alcaftadine (LASTACAFT) 0.25 % SOLN Place 1-2 drops into both eyes daily as needed (allergy eye/irritation.).     Alcohol  Swabs (DROPSAFE ALCOHOL  PREP) 70 % PADS USE TWO TIMES DAILY AS DIRECTED 200 each 3   amiodarone  (PACERONE ) 100 MG tablet TAKE 1 TABLET EVERY DAY 90 tablet 3   apixaban  (ELIQUIS ) 5 MG TABS tablet Take 1 tablet (5 mg total) by mouth 2 (two) times daily. 180 tablet 3   Ascorbic Acid (VITAMIN C) 1000 MG tablet Take 1,000 mg by mouth in the morning.     atorvastatin  (LIPITOR) 40 MG tablet TAKE 1 TABLET EVERY DAY 90 tablet 3   B-D ULTRAFINE III SHORT PEN 31G X 8 MM MISC USE DAILY WITH VICTOZA  AND/OR NOVOLOG . 400 each 2   Blood Glucose Monitoring Suppl (TRUE METRIX AIR GLUCOSE METER) w/Device KIT USE AS DIRECTED 1 kit 3   carvedilol  (COREG ) 12.5 MG tablet TAKE 1 TABLET TWICE DAILY 180 tablet 3   Dasiglucagon  HCl (ZEGALOGUE ) 0.6 MG/0.6ML SOAJ Inject 0.6 mg into the skin daily as needed (  use for extremely low blood  sugars, less than 60). 0.6 mL 3   diclofenac  sodium (VOLTAREN ) 1 % GEL Apply 2 g topically 4 (four) times daily as needed (pain).     ferrous sulfate  324 MG TBEC Take 324 mg by mouth daily.     furosemide  (LASIX ) 20 MG tablet Take 4 tablets (80 mg total) by mouth twice a day. 720 tablet 3   hydrALAZINE  (APRESOLINE ) 100 MG tablet Take 1 tablet (100 mg total) by mouth 3 (three) times daily. 270 tablet 3   hydrocortisone cream 1 % Apply 1 application topically daily as needed for itching.      Insulin  Glargine (TOUJEO  SOLOSTAR [place]) Inject 20 Units into the skin at bedtime.     insulin  lispro (HUMALOG  KWIKPEN) 100 UNIT/ML KwikPen Inject 0-10 Units into the skin See admin instructions. Sliding scale:Inject  (high blood sugar over 150). 150-199=2 units;200-249=4 units,250-299=6 units;300-349=8 units;350 above 10 units: Over 350 call physician 15 mL 2   Isopropyl Alcohol  (ALCOHOL  WIPES) 70 % MISC Apply 1 each topically 2 (two) times daily. 300 each 3   isosorbide  mononitrate (IMDUR ) 30 MG 24 hr tablet TAKE 1 TABLET EVERY DAY 90 tablet 2   lamoTRIgine  (LAMICTAL ) 200 MG tablet Take 1 tablet (200 mg total) by mouth 2 (two) times daily. 180 tablet 3   levETIRAcetam  (KEPPRA ) 500 MG tablet Take 2 tablets every night 180 tablet 3   loratadine  (CLARITIN ) 10 MG tablet Take 1 tablet (10 mg total) by mouth daily. 30 tablet 2   Multiple Vitamin (MULTIVITAMIN) tablet Take 1 tablet by mouth daily.     omeprazole (PRILOSEC) 10 MG capsule Take 10 mg by mouth every Monday, Wednesday, and Friday.     oxyCODONE  (OXY IR/ROXICODONE ) 5 MG immediate release tablet Take 5 mg by mouth every 6 (six) hours as needed (pain.).     OZEMPIC , 1 MG/DOSE, 4 MG/3ML SOPN INJECT 1MG  UNDER THE SKIN ONE TIME WEEKLY ON WEDNESDAYS 9 mL 3   Pancrelipase , Lip-Prot-Amyl, 25000-79000 units CPEP Take 2 capsules by mouth in the morning, at noon, and at bedtime.     potassium chloride  SA (KLOR-CON  M) 20 MEQ tablet Take 3 tablets (60 mEq total) by mouth  daily. 300 tablet 3   sennosides-docusate sodium  (SENOKOT-S) 8.6-50 MG tablet Take 1-2 tablets by mouth at bedtime as needed for constipation.     sertraline  (ZOLOFT ) 50 MG tablet TAKE 1 TABLET EVERY DAY 90 tablet 3   tiZANidine  (ZANAFLEX ) 2 MG tablet TAKE 1 TABLET AT BEDTIME AS NEEDED 30 tablet 11   tolnaftate (TINACTIN) 1 % cream Apply 1 application topically daily as needed (foot fungus).      TRUE METRIX BLOOD GLUCOSE TEST test strip USE AS DIRECTED TO CHECK BLOOD SUGARS 2 TIMES PER DAY 200 strip 3   TRUEplus Lancets 33G MISC TEST BLOOD SUGAR TWICE DAILY AS DIRECTED 200 each 3   benzonatate  (TESSALON ) 100 MG capsule Take 1 capsule by mouth three times daily as needed for cough (Patient not taking: Reported on 02/07/2024) 30 capsule 0   No current facility-administered medications for this encounter.   Facility-Administered Medications Ordered in Other Encounters  Medication Dose Route Frequency Provider Last Rate Last Admin   0.9 %  sodium chloride  infusion (Manually program via Guardrails IV Fluids)   Intravenous Once [place], Harlene HERO, FNP       0.9 %  sodium chloride  infusion (Manually program via Guardrails IV Fluids)   Intravenous Once [place], Harlene HERO, FNP  Wt Readings from Last 3 Encounters:  02/07/24 82.5 kg (181 lb 12.8 oz)  01/14/24 80.6 kg (177 lb 9.6 oz)  12/05/23 83.2 kg (183 lb 8 oz)   BP 120/62   Pulse (!) 58   Wt 82.5 kg (181 lb 12.8 oz)   SpO2 97%   BMI 34.35 kg/m  General: NAD Neck: JVP 8 cm, no thyromegaly or thyroid  nodule.  Lungs: Clear to auscultation bilaterally with normal respiratory effort. CV: Nondisplaced PMI.  Heart regular S1/S2, no S3/S4, 2/6 early SEM RUSB.  No peripheral edema.  No carotid bruit.  Normal pedal pulses.  Abdomen: Soft, nontender, no hepatosplenomegaly, no distention.  Skin: Intact without lesions or rashes.  Neurologic: Alert and oriented x 3.  Psych: Normal affect. Extremities: No clubbing or cyanosis.  HEENT: Normal.    Assessment/Plan: 1. Chronic diastolic CHF/RV failure: Echo in 9/20 showed EF 50-55% with moderate-severe RV dilation and mild-moderately decreased systolic function. RHC/LHC showed mild-moderate pulmonary venous hypertension and no significant coronary disease.  Cause of RV dysfunction is not clear, possibly due to diastolic CHF + OHS/OSA.  V/Q scan in 2/21 did not show acute or chronic PE.  Cardiomems was placed in 5/21, normal filling pressures with moderate pulmonary hypertension likely due to high output (no evidence for shunt lesion on RHC).  Echo in 9/21 with EF 50-55%, severe RVE with severe RV dysfunction.  Echo in 5/22 with EF 60-65%, RV function reportedly normal with normal PA pressure.  Her dyspnea is partially related to lung disease; based on last CT, she has developed mild pulmonary fibrosis since COVID, possibly post-inflammatory.  TEE (2/24) showed EF 60-65%, RV normal. RHC (3/24) with normal filling pressures and moderate pulmonary hypertension in setting of ASD, ASD has now been closed. NYHA class III, functional class confounded by body habitus and arthritis.  Mild volume overload by exam and Cardiomems, weight is up. - Increase Lasix  to 100 mg bid x 3 days then back to 80 mg bid.  Continue to follow Cardiomems.  - Needs BMET/BNP today, may need to adjust K dosing, addition of spironolactone or finerenone.  - Continue Coreg  12.5 mg bid. - She did not tolerate SGLT2-inhibitor due to recurrent yeast infections.    - I will arrange for repeat echo with bubble.  2. OHS/OSA: She is supposed to be using CPAP but not oxygen  during the day (no longer appears to need). CT chest in 10/21 with emphysema and mild fibrosis, possibly post-inflammatory after COVID-19 infection.   - Followup with pulmonary (Dr. Theophilus).  - She needs to use her CPAP more regularly.  3. Pulmonary hypertension: RHC in 10/20 showed mild-moderate pulmonary venous hypertension.  She was later found to have a secundum ASD  which has now been closed.  Not candidate for selective pulmonary vasodilators.  4. HTN: BP controlled.  5. Obesity: Body mass index is 34.35 kg/m. - She is on semaglutide . 6. GI bleeding: No further overt bleeding.  Has had IV Fe.  7. Atypical atrial flutter/atrial fibrillation: Atypical flutter at follow up 2/24.  HR controlled on Coreg . S/p TEE/DCCV to NSR. Back in AFL 7/24. Amiodarone  started and underwent DCCV 8/24 to NSR.  She is in NSR today. - Continue amiodarone  100 mg daily. Check LFTs and TSH. She will need a regular eye exam.  - Continue Coreg .  - Continue apixaban . CBC today.  - Watchman would be a reasonable consideration in the future, especially if she bleeds again.   8. ASD: small 1.9 x  1.2 cm relatively high secundum ASD with left to right flow seen on TEE. RHC (3/24) showed hemodynamically significant ASD with Qp/Qs 1.5/1. s/p ASD closure 7/24.  Followup echo in 7/24 showed well-seated closure device.  - Schedule echo with bubble.   Follow up in 4 months with APP  I spent 31 minutes reviewing records, interviewing/examining patient, and managing orders.SABRA Ezra Shuck  02/09/2024

## 2024-02-13 ENCOUNTER — Telehealth: Payer: Self-pay | Admitting: Pharmacist

## 2024-02-13 DIAGNOSIS — N182 Chronic kidney disease, stage 2 (mild): Secondary | ICD-10-CM

## 2024-02-13 NOTE — Progress Notes (Signed)
   02/13/2024  Patient ID: [name], female   DOB: April 23, 1946, 78 y.o.   MRN: [id number]  Patient was called for diabetes management. She was on the [employer] report for diabetes.  Her last HgA1c was 7.3 % down from 8.3%.     Lab Results  Component Value Date   HGBA1C 7.3 (H) 01/23/2024   HGBA1C 8.3 (H) 08/29/2023   HGBA1C 7.8 (H) 05/08/2023     On Toujeo  20 units at bedtime, Humalog  sliding scale, and Ozempic  1 mg weekly  Lipid Panel     Component Value Date/Time   CHOL 149 08/29/2023 1300   TRIG 43 08/29/2023 1300   HDL 85 08/29/2023 1300   CHOLHDL 1.8 08/29/2023 1300   CHOLHDL 1.8 05/04/2021 1014   VLDL 3 05/04/2021 1014   LDLCALC 54 08/29/2023 1300   LDLDIRECT 66.0 01/27/2015 1104   LABVLDL 10 08/29/2023 1300   On Atorvastatin  40 mg last filled 02/12/24 for a 90 day supply   Not sure Patient will still be on the report since her A1c has decreased.  Will outreach the Patient again in 1 month.  Cassius DOROTHA Brought, PharmD, BCACP Clinical Pharmacist [phone]

## 2024-02-19 ENCOUNTER — Inpatient Hospital Stay

## 2024-02-19 ENCOUNTER — Inpatient Hospital Stay: Admitting: Internal Medicine

## 2024-02-20 ENCOUNTER — Ambulatory Visit (HOSPITAL_COMMUNITY)
Admission: RE | Admit: 2024-02-20 | Discharge: 2024-02-20 | Disposition: A | Discharge status: Home / Self Care | Source: Ambulatory Visit | Attending: Physician Assistant | Admitting: Physician Assistant

## 2024-02-20 DIAGNOSIS — Q2112 Patent foramen ovale: Secondary | ICD-10-CM | POA: Diagnosis not present

## 2024-02-20 DIAGNOSIS — I272 Pulmonary hypertension, unspecified: Secondary | ICD-10-CM | POA: Insufficient documentation

## 2024-02-20 DIAGNOSIS — Q211 Atrial septal defect, unspecified: Secondary | ICD-10-CM | POA: Diagnosis not present

## 2024-02-20 DIAGNOSIS — R0602 Shortness of breath: Secondary | ICD-10-CM | POA: Insufficient documentation

## 2024-02-20 DIAGNOSIS — R011 Cardiac murmur, unspecified: Secondary | ICD-10-CM | POA: Diagnosis not present

## 2024-02-20 DIAGNOSIS — R55 Syncope and collapse: Secondary | ICD-10-CM | POA: Insufficient documentation

## 2024-02-20 DIAGNOSIS — G4730 Sleep apnea, unspecified: Secondary | ICD-10-CM | POA: Diagnosis not present

## 2024-02-20 DIAGNOSIS — E785 Hyperlipidemia, unspecified: Secondary | ICD-10-CM | POA: Diagnosis not present

## 2024-02-20 DIAGNOSIS — R079 Chest pain, unspecified: Secondary | ICD-10-CM | POA: Insufficient documentation

## 2024-02-20 DIAGNOSIS — Z9889 Other specified postprocedural states: Secondary | ICD-10-CM | POA: Diagnosis not present

## 2024-02-20 DIAGNOSIS — I509 Heart failure, unspecified: Secondary | ICD-10-CM | POA: Diagnosis not present

## 2024-02-20 LAB — ECHOCARDIOGRAM COMPLETE BUBBLE STUDY
AR max vel: 2.73 cm2
AV Area VTI: 2.8 cm2
AV Area mean vel: 2.83 cm2
AV Mean grad: 7 mmHg
AV Peak grad: 13.5 mmHg
Ao pk vel: 1.84 m/s
Area-P 1/2: 2.66 cm2
Calc EF: 63.5 %
S' Lateral: 2.7 cm
Single Plane A2C EF: 63 %
Single Plane A4C EF: 67.1 %

## 2024-02-20 NOTE — Progress Notes (Signed)
  Echocardiogram 2D Echocardiogram has been performed.  Devora Ellouise SAUNDERS 02/20/2024, 10:38 AM

## 2024-02-21 ENCOUNTER — Ambulatory Visit: Payer: Self-pay | Admitting: Physician Assistant

## 2024-02-21 NOTE — Progress Notes (Signed)
 Reviewed study. I don't see any evidence of color flow around the device. Few late bubbles. Overall looks very good. No further eval indicated.

## 2024-02-25 ENCOUNTER — Other Ambulatory Visit: Payer: Self-pay | Admitting: Medical Oncology

## 2024-02-25 DIAGNOSIS — D500 Iron deficiency anemia secondary to blood loss (chronic): Secondary | ICD-10-CM

## 2024-02-26 ENCOUNTER — Inpatient Hospital Stay: Attending: Physician Assistant

## 2024-02-26 ENCOUNTER — Inpatient Hospital Stay (HOSPITAL_BASED_OUTPATIENT_CLINIC_OR_DEPARTMENT_OTHER): Admitting: Internal Medicine

## 2024-02-26 ENCOUNTER — Other Ambulatory Visit: Payer: Self-pay | Admitting: Internal Medicine

## 2024-02-26 VITALS — BP 130/60 | HR 62 | Temp 97.3°F | Resp 17 | Ht 61.0 in | Wt 176.9 lb

## 2024-02-26 DIAGNOSIS — Z79899 Other long term (current) drug therapy: Secondary | ICD-10-CM | POA: Insufficient documentation

## 2024-02-26 DIAGNOSIS — D509 Iron deficiency anemia, unspecified: Secondary | ICD-10-CM | POA: Diagnosis not present

## 2024-02-26 DIAGNOSIS — D500 Iron deficiency anemia secondary to blood loss (chronic): Secondary | ICD-10-CM | POA: Diagnosis not present

## 2024-02-26 DIAGNOSIS — R5383 Other fatigue: Secondary | ICD-10-CM | POA: Diagnosis not present

## 2024-02-26 LAB — CMP (CANCER CENTER ONLY)
ALT: 18 U/L (ref 0–44)
AST: 19 U/L (ref 15–41)
Albumin: 3.8 g/dL (ref 3.5–5.0)
Alkaline Phosphatase: 111 U/L (ref 38–126)
Anion gap: 6 (ref 5–15)
BUN: 18 mg/dL (ref 8–23)
CO2: 30 mmol/L (ref 22–32)
Calcium: 8.8 mg/dL — ABNORMAL LOW (ref 8.9–10.3)
Chloride: 101 mmol/L (ref 98–111)
Creatinine: 0.99 mg/dL (ref 0.44–1.00)
GFR, Estimated: 59 mL/min — ABNORMAL LOW (ref >60)
Glucose, Bld: 129 mg/dL — ABNORMAL HIGH (ref 70–99)
Potassium: 3.5 mmol/L (ref 3.5–5.1)
Sodium: 137 mmol/L (ref 135–145)
Total Bilirubin: 0.3 mg/dL (ref 0.0–1.2)
Total Protein: 7.5 g/dL (ref 6.5–8.1)

## 2024-02-26 LAB — CBC WITH DIFFERENTIAL (CANCER CENTER ONLY)
Abs Immature Granulocytes: 0 K/uL (ref 0.00–0.07)
Basophils Absolute: 0 K/uL (ref 0.0–0.1)
Basophils Relative: 1 %
Eosinophils Absolute: 0 K/uL (ref 0.0–0.5)
Eosinophils Relative: 1 %
HCT: 26.9 % — ABNORMAL LOW (ref 36.0–46.0)
Hemoglobin: 8.4 g/dL — ABNORMAL LOW (ref 12.0–15.0)
Immature Granulocytes: 0 %
Lymphocytes Relative: 32 %
Lymphs Abs: 1 K/uL (ref 0.7–4.0)
MCH: 23.3 pg — ABNORMAL LOW (ref 26.0–34.0)
MCHC: 31.2 g/dL (ref 30.0–36.0)
MCV: 74.5 fL — ABNORMAL LOW (ref 80.0–100.0)
Monocytes Absolute: 0.4 K/uL (ref 0.1–1.0)
Monocytes Relative: 11 %
Neutro Abs: 1.8 K/uL (ref 1.7–7.7)
Neutrophils Relative %: 55 %
Platelet Count: 267 K/uL (ref 150–400)
RBC: 3.61 MIL/uL — ABNORMAL LOW (ref 3.87–5.11)
RDW: 15.9 % — ABNORMAL HIGH (ref 11.5–15.5)
WBC Count: 3.3 K/uL — ABNORMAL LOW (ref 4.0–10.5)
nRBC: 0 % (ref 0.0–0.2)

## 2024-02-26 LAB — IRON AND IRON BINDING CAPACITY (CC-WL,HP ONLY)
Iron: 26 ug/dL — ABNORMAL LOW (ref 28–170)
Saturation Ratios: 6 % — ABNORMAL LOW (ref 10.4–31.8)
TIBC: 405 ug/dL (ref 250–450)
UIBC: 379 ug/dL (ref 148–442)

## 2024-02-26 LAB — FERRITIN: Ferritin: 12 ng/mL (ref 11–307)

## 2024-02-26 LAB — VITAMIN B12: Vitamin B-12: 488 pg/mL (ref 180–914)

## 2024-02-26 LAB — FOLATE: Folate: 13.8 ng/mL (ref >5.9)

## 2024-02-26 NOTE — Progress Notes (Signed)
 [facility] Telephone:(336) [phone]   Fax:(336) [phone]  OFFICE PROGRESS NOTE  Jarold Medici, MD [address] [place] [address]  DIAGNOSIS: Iron  deficiency anemia    PRIOR THERAPY: None   CURRENT THERAPY:  1) oral iron  supplement OTC tablet p.o. daily 2) IV iron  as needed most recent with Ferrlecit 250 mg in June 2025  INTERVAL HISTORY: [name] 78 y.o. female returns to the clinic today for follow-up visit.Discussed the use of AI scribe software for clinical note transcription with the patient, who gave verbal consent to proceed.  History of Present Illness [name] is a 78 year old female with iron  deficiency anemia who presents for evaluation and repeat blood work.  She experiences increased fatigue and occasional dizziness. She takes over-the-counter ferrous sulfate  once every morning with breakfast and receives intravenous iron  infusions as needed. Her most recent hemoglobin levels were 10.3 in June, 9.4 in August, and 8.4 currently. She prefers receiving her iron  infusions at the current location rather than the [facility] infusion center.  She mentions experiencing a few dizzy spells and feeling a little off balance, but denies any chest pain. No gum or nose bleeds, although she had a tooth extraction approximately two months ago. She notes a previous instance of blood in her stool, but it was not present upon subsequent bowel movements. She has not seen her gastroenterologist in a while.  She reports breathing issues but states that she is back on her CPAP, which helps significantly.     MEDICAL HISTORY: Past Medical History:  Diagnosis Date   Abnormal liver function     in the past.   Anemia    Arthritis    all over   Bruises easily    Cataract    right eye;immature   CHF (congestive heart failure) (HCC)    Chronic back pain    stenosis   Chronic cough    Chronic kidney disease    COPD (chronic obstructive  pulmonary disease) (HCC)    COVID    aug/sept 2021   Demand myocardial infarction [facility]) 2012   Demand Infarction in setting of Pancreatitis --> mild Troponin elevation, NON-OBSTRUCTIVE CAD   Depression    takes Abilify  daily as well as Zoloft    Diastolic heart failure 2010   Grade 1 diastolic Dysfunction by Echo    Diverticulosis    DM (diabetes mellitus) (HCC)    takes Victoza  daily as well as Lantus  and Humalog    Empty sella (HCC)    on MRI in 2009.   Glaucoma    Headache(784.0)    last migraine-4-3yrs ago   History of blood transfusion    no abnormal reaction noted   History of colon polyps    benign   HTN (hypertension)    takes Benicar ,Imdur ,and Bystolic  daily   Hyperlipidemia    takes Lipitor daily   Joint swelling    Nocturia    Obesity hypoventilation syndrome (HCC)    Obstructive sleep apnea 02/2018   Notably improved split-night study with weight loss from 270 (2017) down to 250 pounds (2019).   Pancreatitis    takes Pancrelipase  daily   Parkinson's disease (HCC)    takes Sinemet  daily   Peripheral neuropathy    Pneumonia 2012   Seizures (HCC)    takes Lamictal  daily and Primidone  nightly;last seizure 2wks ago   Urinary urgency    With increased frequency   Varicose veins of both  lower extremities with pain    With edema.  Takes daily Lasix     ALLERGIES:  is allergic to other, penicillins, sulfa antibiotics, aspirin , and codeine.  MEDICATIONS:  Current Outpatient Medications  Medication Sig Dispense Refill   acetaminophen  (TYLENOL ) 500 MG tablet Take 500-1,000 mg by mouth every 4 (four) hours as needed for moderate pain or mild pain.     albuterol  (VENTOLIN  HFA) 108 (90 Base) MCG/ACT inhaler Inhale 1-2 puffs into the lungs every 6 (six) hours as needed for wheezing or shortness of breath. 17 each 5   Alcaftadine (LASTACAFT) 0.25 % SOLN Place 1-2 drops into both eyes daily as needed (allergy eye/irritation.).     Alcohol  Swabs (DROPSAFE ALCOHOL  PREP) 70 %  PADS USE TWO TIMES DAILY AS DIRECTED 200 each 3   amiodarone  (PACERONE ) 100 MG tablet TAKE 1 TABLET EVERY DAY 90 tablet 3   apixaban  (ELIQUIS ) 5 MG TABS tablet Take 1 tablet (5 mg total) by mouth 2 (two) times daily. 180 tablet 3   Ascorbic Acid (VITAMIN C) 1000 MG tablet Take 1,000 mg by mouth in the morning.     atorvastatin  (LIPITOR) 40 MG tablet TAKE 1 TABLET EVERY DAY 90 tablet 3   B-D ULTRAFINE III SHORT PEN 31G X 8 MM MISC USE DAILY WITH VICTOZA  AND/OR NOVOLOG . 400 each 2   benzonatate  (TESSALON ) 100 MG capsule Take 1 capsule by mouth three times daily as needed for cough (Patient not taking: Reported on 02/07/2024) 30 capsule 0   Blood Glucose Monitoring Suppl (TRUE METRIX AIR GLUCOSE METER) w/Device KIT USE AS DIRECTED 1 kit 3   carvedilol  (COREG ) 12.5 MG tablet TAKE 1 TABLET TWICE DAILY 180 tablet 3   Dasiglucagon  HCl (ZEGALOGUE ) 0.6 MG/0.6ML SOAJ Inject 0.6 mg into the skin daily as needed (use for extremely low blood sugars, less than 60). 0.6 mL 3   diclofenac  sodium (VOLTAREN ) 1 % GEL Apply 2 g topically 4 (four) times daily as needed (pain).     ferrous sulfate  324 MG TBEC Take 324 mg by mouth daily.     furosemide  (LASIX ) 20 MG tablet Take 4 tablets (80 mg total) by mouth twice a day. 720 tablet 3   hydrALAZINE  (APRESOLINE ) 100 MG tablet Take 1 tablet (100 mg total) by mouth 3 (three) times daily. 270 tablet 3   hydrocortisone cream 1 % Apply 1 application topically daily as needed for itching.      Insulin  Glargine (TOUJEO  SOLOSTAR [place]) Inject 20 Units into the skin at bedtime.     insulin  lispro (HUMALOG  KWIKPEN) 100 UNIT/ML KwikPen Inject 0-10 Units into the skin See admin instructions. Sliding scale:Inject  (high blood sugar over 150). 150-199=2 units;200-249=4 units,250-299=6 units;300-349=8 units;350 above 10 units: Over 350 call physician 15 mL 2   Isopropyl Alcohol  (ALCOHOL  WIPES) 70 % MISC Apply 1 each topically 2 (two) times daily. 300 each 3   isosorbide  mononitrate (IMDUR )  30 MG 24 hr tablet TAKE 1 TABLET EVERY DAY 90 tablet 2   lamoTRIgine  (LAMICTAL ) 200 MG tablet Take 1 tablet (200 mg total) by mouth 2 (two) times daily. 180 tablet 3   levETIRAcetam  (KEPPRA ) 500 MG tablet Take 2 tablets every night 180 tablet 3   loratadine  (CLARITIN ) 10 MG tablet Take 1 tablet (10 mg total) by mouth daily. 30 tablet 2   Multiple Vitamin (MULTIVITAMIN) tablet Take 1 tablet by mouth daily.     omeprazole (PRILOSEC) 10 MG capsule Take 10 mg by mouth every Monday, Wednesday, and  Friday.     oxyCODONE  (OXY IR/ROXICODONE ) 5 MG immediate release tablet Take 5 mg by mouth every 6 (six) hours as needed (pain.).     OZEMPIC , 1 MG/DOSE, 4 MG/3ML SOPN INJECT 1MG  UNDER THE SKIN ONE TIME WEEKLY ON WEDNESDAYS 9 mL 3   Pancrelipase , Lip-Prot-Amyl, 25000-79000 units CPEP Take 2 capsules by mouth in the morning, at noon, and at bedtime.     potassium chloride  SA (KLOR-CON  M) 20 MEQ tablet Take 3 tablets (60 mEq total) by mouth daily. 300 tablet 3   sennosides-docusate sodium  (SENOKOT-S) 8.6-50 MG tablet Take 1-2 tablets by mouth at bedtime as needed for constipation.     sertraline  (ZOLOFT ) 50 MG tablet TAKE 1 TABLET EVERY DAY 90 tablet 3   tiZANidine  (ZANAFLEX ) 2 MG tablet TAKE 1 TABLET AT BEDTIME AS NEEDED 30 tablet 11   tolnaftate (TINACTIN) 1 % cream Apply 1 application topically daily as needed (foot fungus).      TRUE METRIX BLOOD GLUCOSE TEST test strip USE AS DIRECTED TO CHECK BLOOD SUGARS 2 TIMES PER DAY 200 strip 3   TRUEplus Lancets 33G MISC TEST BLOOD SUGAR TWICE DAILY AS DIRECTED 200 each 3   No current facility-administered medications for this visit.   Facility-Administered Medications Ordered in Other Visits  Medication Dose Route Frequency Provider Last Rate Last Admin   0.9 %  sodium chloride  infusion (Manually program via Guardrails IV Fluids)   Intravenous Once [place], Harlene HERO, FNP       0.9 %  sodium chloride  infusion (Manually program via Guardrails IV Fluids)    Intravenous Once [place], Harlene HERO, FNP        SURGICAL HISTORY:  Past Surgical History:  Procedure Laterality Date   ABDOMINAL HYSTERECTOMY     APPENDECTOMY     ATRIAL SEPTAL DEFECT(ASD) CLOSURE N/A 01/02/2023   Procedure: ATRIAL SEPTAL DEFECT(ASD) CLOSURE;  Surgeon: Wonda Sharper, MD;  Location: [facility] INVASIVE CV LAB;  Service: Cardiovascular;  Laterality: N/A;   BACK SURGERY     Cardiac Event Monitor  September-October 2017   Sinus rhythm with occasional PACs and artifact. No arrhythmias besides one short run of tachycardia.   CARDIOVERSION N/A 08/15/2022   Procedure: CARDIOVERSION;  Surgeon: Rolan Ezra RAMAN, MD;  Location: [facility] ENDOSCOPY;  Service: Cardiovascular;  Laterality: N/A;   CARDIOVERSION N/A 02/04/2023   Procedure: CARDIOVERSION;  Surgeon: Rolan Ezra RAMAN, MD;  Location: [facility] INVASIVE CV LAB;  Service: Cardiovascular;  Laterality: N/A;   CHOLECYSTECTOMY     COLONOSCOPY N/A 08/15/2012   Procedure: COLONOSCOPY;  Surgeon: Elsie Cree, MD;  Location: [facility] ENDOSCOPY;  Service: Endoscopy;  Laterality: N/A;   COLONOSCOPY WITH PROPOFOL  Left 06/17/2017   Procedure: COLONOSCOPY WITH PROPOFOL ;  Surgeon: Saintclair Jasper, MD;  Location: [facility] ENDOSCOPY;  Service: Gastroenterology;  Laterality: Left;   COLONOSCOPY WITH PROPOFOL  N/A 04/03/2021   Procedure: COLONOSCOPY WITH PROPOFOL ;  Surgeon: Rosalie Kitchens, MD;  Location: [facility] ENDOSCOPY;  Service: Endoscopy;  Laterality: N/A;   CORONARY CALCIUM  SCORE AND CTA  04/2018   Coronary calcium  score 71.9.  Very large, hyperdynamic LAD wrapping apex giving rise to PDA.  Moderate LAD-diagonal and circumflex plaque -> CT FFR suggested positive findings in distal D1, D2 and distal circumflex.  Referred for cath. ==> FALSE POSITIVE   ESOPHAGOGASTRODUODENOSCOPY     eye cysts Bilateral    LASIK     LEFT HEART CATH AND CORONARY ANGIOGRAPHY N/A 05/16/2018   Procedure: LEFT HEART CATH AND CORONARY ANGIOGRAPHY;  Surgeon: Claudene Victory ORN, MD;  Location: [facility] INVASIVE CV LAB:   Angiographically normal coronary arteries with LVEDP of 21 mmHg.  Large draping hyperdominant LAD that wraps the apex and provides distal half of the PDA.  Relatively small caliber distal Cx -->proxLPDA, non-dom RCA. No Cx or Diag lesions -- FALSE + CT FFR   LEFT HEART CATH AND CORONARY ANGIOGRAPHY  2009/March 2012   2009: (Dr. Hershel) Nonobstructive CAD; 2012: Minimal CAD --> false positive stress test   NM MYOVIEW  LTD  01/2016; 01/2018   a)LOW RISK. No ischemia or infarction;; b) EF 55-60%. NO ST changes.  No ischemia or Infarction. NO significant RV enlargement.  LOW RISK.   POLYPECTOMY  04/03/2021   Procedure: POLYPECTOMY;  Surgeon: Rosalie Kitchens, MD;  Location: [facility] ENDOSCOPY;  Service: Endoscopy;;   Polysomnogram  02/2018   (Dr. Chalice from neurology): Split study was not adequately done due to low AHI.  She slept reclined and had an AHI less than 10, prolonged hypoxemia and no hypercapnia. -->  She has home oxygen  already prescribed, but did not meet criteria for nightly oxygen .  (Was noted that the patient did not cooperate well with study.  Titration was discussed.   PRESSURE SENSOR/CARDIOMEMS N/A 11/09/2019   Procedure: PRESSURE SENSOR/CARDIOMEMS;  Surgeon: Rolan Ezra RAMAN, MD;  Location: [facility] INVASIVE CV LAB;  Service: Cardiovascular;  Laterality: N/A;   RECTAL POLYPECTOMY     RIGHT HEART CATH N/A 09/07/2022   Procedure: RIGHT HEART CATH;  Surgeon: Rolan Ezra RAMAN, MD;  Location: [facility] INVASIVE CV LAB;  Service: Cardiovascular;  Laterality: N/A;   RIGHT/LEFT HEART CATH AND CORONARY ANGIOGRAPHY N/A 04/06/2019   Procedure: RIGHT/LEFT HEART CATH AND CORONARY ANGIOGRAPHY;  Surgeon: Anner Alm ORN, MD;  Location: [facility] INVASIVE CV LAB; angiographically normal coronary arteries.  Moderately elevated AD LVEDP..  Mild pulmonary hypertension: PA P 56/14 mmHg-mean 31 mmHg.  RAP 10 mmHg.  RV P-EDP 54/4 mmHg - 12 mmHg.  PCWP 11-15 mmHg.  CO-CI: 7.35-3.73.   TEE WITHOUT CARDIOVERSION N/A 08/15/2022    Procedure: TRANSESOPHAGEAL ECHOCARDIOGRAM (TEE);  Surgeon: Rolan Ezra RAMAN, MD;  Location: [facility] ENDOSCOPY;  Service: Cardiovascular;  Laterality: N/A;   TONSILLECTOMY     TRANSTHORACIC ECHOCARDIOGRAM  01/2016; 05/2017   A) Normal LV chamber size with mod LVH pattern. EF 50-55%. Severe LA dilation. Mod RA dilation. PAP elevated at 38 mmHg;; B) Mild concentric LVH.  EF 55-60% & no RWMA.  Grade 2 DD.  Diastolic flattening of the ventricular septum == ? elevated PAP.  Mild aortic valve calcification/sclerosis.  Mod LA dilation.  Mild RV dilation & Mod RA dilation   TRANSTHORACIC ECHOCARDIOGRAM  10/2017; 01/2018   a) EF 55-60%.  No RWMA,  Moderate LA dilation.  Mild LA dilation.  Peak PA pressures ~57 mmHg (moderate);;; b) EF 55-60%.  GRII DD.  Suggestion of RV volume overload with moderate RV dilation.  Severe LA and RA dilation.SABRA  PA pressure ~67%.   TRANSTHORACIC ECHOCARDIOGRAM  01/2019   Normal LV size and function.  EF 50 to 55%.  Impaired relaxation.  RV appears to have moderately reduced function.  Severely enlarged.  Cannot fully assess RV pressures.  Hypermobile interatrial septum.  Right atrium severely dilated.   TUBAL LIGATION     vaginal cyst removed     several times    REVIEW OF SYSTEMS:  A comprehensive review of systems was negative except for: Constitutional: positive for fatigue   PHYSICAL EXAMINATION: General appearance: alert, cooperative, fatigued, and no distress Head: Normocephalic, without obvious  abnormality, atraumatic Neck: no adenopathy, no JVD, supple, symmetrical, trachea midline, and thyroid  not enlarged, symmetric, no tenderness/mass/nodules Lymph nodes: Cervical, supraclavicular, and axillary nodes normal. Resp: clear to auscultation bilaterally Back: symmetric, no curvature. ROM normal. No CVA tenderness. Cardio: regular rate and rhythm, S1, S2 normal, no murmur, click, rub or gallop GI: soft, non-tender; bowel sounds normal; no masses,  no organomegaly Extremities:  extremities normal, atraumatic, no cyanosis or edema  ECOG PERFORMANCE STATUS: 1 - Symptomatic but completely ambulatory  Blood pressure 130/60, pulse 62, temperature (!) 97.3 F (36.3 C), resp. rate 17, height 5' 1 (1.549 m), weight 176 lb 14.4 oz (80.2 kg), SpO2 97%.  LABORATORY DATA: Lab Results  Component Value Date   WBC 3.3 (L) 02/26/2024   HGB 8.4 (L) 02/26/2024   HCT 26.9 (L) 02/26/2024   MCV 74.5 (L) 02/26/2024   PLT 267 02/26/2024      Chemistry      Component Value Date/Time   NA 138 02/07/2024 1214   NA 141 08/29/2023 1300   K 3.4 (L) 02/07/2024 1214   CL 99 02/07/2024 1214   CO2 30 02/07/2024 1214   BUN 14 02/07/2024 1214   BUN 13 08/29/2023 1300   CREATININE 0.97 02/07/2024 1214   CREATININE 0.97 04/04/2023 1310   CREATININE 0.91 10/04/2016 1144      Component Value Date/Time   CALCIUM  8.6 (L) 02/07/2024 1214   ALKPHOS 104 02/07/2024 1214   AST 23 02/07/2024 1214   AST 29 04/04/2023 1310   ALT 19 02/07/2024 1214   ALT 23 04/04/2023 1310   BILITOT 0.6 02/07/2024 1214   BILITOT 0.3 08/29/2023 1300   BILITOT 0.3 04/04/2023 1310       RADIOGRAPHIC STUDIES: ECHOCARDIOGRAM COMPLETE BUBBLE STUDY Result Date: 02/20/2024    ECHOCARDIOGRAM REPORT   Patient Name:   [name] Date of Exam: 02/20/2024 Medical Rec #:  [id number]       Height:       61.0 in Accession #:    [id number]      Weight:       181.8 lb Date of Birth:  06-25-1946      BSA:          1.814 m Patient Age:    77 years        BP:           131/64 mmHg Patient Gender: F               HR:           64 bpm. Exam Location:  Outpatient Procedure: 2D Echo, 3D Echo, Cardiac Doppler, Color Doppler, Strain Analysis and            Saline Contrast Bubble Study (Both Spectral and Color Flow Doppler            were utilized during procedure). Indications:    Atrial septal defect  History:        Patient has prior history of Echocardiogram examinations, most                 recent 02/06/2024. CHF, Pulmonary  HTN, Signs/Symptoms:Dyspnea,                 Shortness of Breath, Syncope, Chest Pain and Murmur; Risk                 Factors:Sleep Apnea and Dyslipidemia. ASD closure procedure.  Sonographer:    Ellouise Mose RDCS Referring Phys: [id number] KATHRYN  R THOMPSON IMPRESSIONS  1. Left ventricular ejection fraction, by estimation, is 60 to 65%. Left ventricular ejection fraction by 3D volume is 67 %. The left ventricle has normal function. The left ventricle has no regional wall motion abnormalities. Left ventricular diastolic  parameters were normal.  2. Right ventricular systolic function is normal. The right ventricular size is normal. There is mildly elevated pulmonary artery systolic pressure. The estimated right ventricular systolic pressure is 36.7 mmHg.  3. Left atrial size was severely dilated.  4. Interatrial occluder device in place. Positive bubble study with breath hold by beat 6. Evidence of atrial level shunting detected by color flow Doppler. Agitated saline contrast bubble study was positive with shunting observed within 3-6 cardiac cycles suggestive of interatrial shunt. There is a small patent foramen ovale.  5. Right atrial size was mild to moderately dilated.  6. The mitral valve is normal in structure. No evidence of mitral valve regurgitation.  7. The aortic valve is normal in structure. Aortic valve regurgitation is not visualized. FINDINGS  Left Ventricle: Left ventricular ejection fraction, by estimation, is 60 to 65%. Left ventricular ejection fraction by 3D volume is 67 %. The left ventricle has normal function. The left ventricle has no regional wall motion abnormalities. The left ventricular internal cavity size was normal in size. There is no left ventricular hypertrophy. Left ventricular diastolic parameters were normal. Right Ventricle: The right ventricular size is normal. No increase in right ventricular wall thickness. Right ventricular systolic function is normal. There is mildly elevated  pulmonary artery systolic pressure. The tricuspid regurgitant velocity is 2.68  m/s, and with an assumed right atrial pressure of 8 mmHg, the estimated right ventricular systolic pressure is 36.7 mmHg. Left Atrium: Left atrial size was severely dilated. Right Atrium: Right atrial size was mild to moderately dilated. Pericardium: There is no evidence of pericardial effusion. Mitral Valve: The mitral valve is normal in structure. No evidence of mitral valve regurgitation. Tricuspid Valve: The tricuspid valve is normal in structure. Tricuspid valve regurgitation is not demonstrated. Aortic Valve: The aortic valve is normal in structure. Aortic valve regurgitation is not visualized. Aortic valve mean gradient measures 7.0 mmHg. Aortic valve peak gradient measures 13.5 mmHg. Aortic valve area, by VTI measures 2.80 cm. Pulmonic Valve: The pulmonic valve was normal in structure. Pulmonic valve regurgitation is not visualized. Aorta: The aortic root is normal in size and structure. IAS/Shunts: Evidence of atrial level shunting detected by color flow Doppler. Agitated saline contrast was given intravenously to evaluate for intracardiac shunting. Agitated saline contrast bubble study was positive with shunting observed within 3-6 cardiac cycles suggestive of interatrial shunt. A small patent foramen ovale is detected. Additional Comments: 3D was performed not requiring image post processing on an independent workstation and was normal.  LEFT VENTRICLE PLAX 2D LVIDd:         4.60 cm         Diastology LVIDs:         2.70 cm         LV e' medial:    5.44 cm/s LV PW:         1.10 cm         LV E/e' medial:  18.3 LV IVS:        1.20 cm         LV e' lateral:   11.00 cm/s LVOT diam:     2.30 cm         LV E/e' lateral: 9.0  LV SV:         115 LV SV Index:   63 LVOT Area:     4.15 cm        3D Volume EF                                LV 3D EF:    Left                                             ventricul LV Volumes (MOD)                             ar LV vol d, MOD    126.0 ml                   ejection A2C:                                        fraction LV vol d, MOD    110.0 ml                   by 3D A4C:                                        volume is LV vol s, MOD    46.6 ml                    67 %. A2C: LV vol s, MOD    36.2 ml A4C:                           3D Volume EF: LV SV MOD A2C:   79.4 ml       3D EF:        67 % LV SV MOD A4C:   110.0 ml LV SV MOD BP:    76.4 ml RIGHT VENTRICLE             IVC RV S prime:     14.50 cm/s  IVC diam: 1.90 cm TAPSE (M-mode): 2.3 cm LEFT ATRIUM            Index        RIGHT ATRIUM           Index LA diam:      4.80 cm  2.65 cm/m   RA Area:     21.80 cm LA Vol (A2C): 56.8 ml  31.32 ml/m  RA Volume:   66.00 ml  36.39 ml/m LA Vol (A4C): 120.0 ml 66.16 ml/m  AORTIC VALVE AV Area (Vmax):    2.73 cm AV Area (Vmean):   2.83 cm AV Area (VTI):     2.80 cm AV Vmax:           184.00 cm/s AV Vmean:          118.000 cm/s AV VTI:            0.410 m AV Peak Grad:      13.5 mmHg AV Mean Grad:  7.0 mmHg LVOT Vmax:         121.00 cm/s LVOT Vmean:        80.400 cm/s LVOT VTI:          0.276 m LVOT/AV VTI ratio: 0.67  AORTA Ao Root diam: 3.00 cm Ao Asc diam:  2.90 cm MITRAL VALVE               TRICUSPID VALVE MV Area (PHT): 2.66 cm    TR Peak grad:   28.7 mmHg MV Decel Time: 285 msec    TR Vmax:        268.00 cm/s MV E velocity: 99.30 cm/s MV A velocity: 89.50 cm/s  SHUNTS MV E/A ratio:  1.11        Systemic VTI:  0.28 m                            Systemic Diam: 2.30 cm Aditya Sabharwal Electronically signed by Ria Commander Signature Date/Time: 02/20/2024/3:16:48 PM    Final     ASSESSMENT AND PLAN: This is a very pleasant 78 years old African-American female with iron  deficiency anemia secondary to gastrointestinal blood loss.  She is status post iron  infusion with Venofer  in June 2024 and she is currently on oral iron  tablet on daily basis.   CBC today showed hemoglobin of 8.4 and hematocrit of  26.9 with MCV of 74.5. Iron  study and ferritin are still pending. Assessment and Plan Assessment & Plan Iron  deficiency anemia Iron  deficiency anemia with decreasing hemoglobin, currently at 8.4, down from 10.3 in June and 9.4 in August. Awaiting iron  study results for further management. No recent gastrointestinal bleeding, but recent tooth extraction two months ago. Prefers iron  infusions at the current location rather than [facility] infusion center. - Order iron  study - Arrange iron  infusion if iron  levels are low - Coordinate with infusion center to accommodate her location preference  Fatigue Increased fatigue, possibly related to worsening anemia. No new symptoms such as chest pain or significant dizziness. Experiences some dizziness and uses CPAP for breathing issues, which helps. The patient was advised to call immediately if she has any concerning symptoms in the interval. The patient voices understanding of current disease status and treatment options and is in agreement with the current care plan.  All questions were answered. The patient knows to call the clinic with any problems, questions or concerns. We can certainly see the patient much sooner if necessary.  The total time spent in the appointment was 20 minutes.  Disclaimer: This note was dictated with voice recognition software. Similar sounding words can inadvertently be transcribed and may not be corrected upon review.

## 2024-03-07 ENCOUNTER — Other Ambulatory Visit (HOSPITAL_COMMUNITY): Payer: Self-pay | Admitting: Family Medicine

## 2024-03-07 ENCOUNTER — Other Ambulatory Visit (HOSPITAL_COMMUNITY): Payer: Self-pay | Admitting: Cardiology

## 2024-03-07 DIAGNOSIS — I5032 Chronic diastolic (congestive) heart failure: Secondary | ICD-10-CM

## 2024-03-09 ENCOUNTER — Other Ambulatory Visit: Payer: Self-pay | Admitting: Cardiovascular Disease

## 2024-03-10 ENCOUNTER — Other Ambulatory Visit: Payer: Self-pay | Admitting: Cardiology

## 2024-03-11 ENCOUNTER — Other Ambulatory Visit: Payer: Self-pay | Admitting: Cardiovascular Disease

## 2024-03-11 MED ORDER — ISOSORBIDE MONONITRATE ER 30 MG PO TB24: 30.0000 mg | ORAL_TABLET | Freq: Every day | ORAL | 0 refills | Status: DC

## 2024-03-18 ENCOUNTER — Other Ambulatory Visit: Payer: Self-pay | Admitting: Internal Medicine

## 2024-03-21 ENCOUNTER — Emergency Department (HOSPITAL_COMMUNITY)
Admission: EM | Admit: 2024-03-21 | Discharge: 2024-03-21 | Disposition: A | Discharge status: Home / Self Care | Attending: Emergency Medicine | Admitting: Emergency Medicine

## 2024-03-21 ENCOUNTER — Emergency Department (HOSPITAL_COMMUNITY)

## 2024-03-21 ENCOUNTER — Other Ambulatory Visit: Payer: Self-pay

## 2024-03-21 DIAGNOSIS — Z794 Long term (current) use of insulin: Secondary | ICD-10-CM | POA: Insufficient documentation

## 2024-03-21 DIAGNOSIS — E876 Hypokalemia: Secondary | ICD-10-CM | POA: Diagnosis not present

## 2024-03-21 DIAGNOSIS — G20C Parkinsonism, unspecified: Secondary | ICD-10-CM | POA: Insufficient documentation

## 2024-03-21 DIAGNOSIS — R4182 Altered mental status, unspecified: Secondary | ICD-10-CM | POA: Diagnosis not present

## 2024-03-21 DIAGNOSIS — Z79899 Other long term (current) drug therapy: Secondary | ICD-10-CM | POA: Insufficient documentation

## 2024-03-21 DIAGNOSIS — I63511 Cerebral infarction due to unspecified occlusion or stenosis of right middle cerebral artery: Secondary | ICD-10-CM | POA: Diagnosis not present

## 2024-03-21 DIAGNOSIS — I509 Heart failure, unspecified: Secondary | ICD-10-CM | POA: Insufficient documentation

## 2024-03-21 DIAGNOSIS — N189 Chronic kidney disease, unspecified: Secondary | ICD-10-CM | POA: Insufficient documentation

## 2024-03-21 DIAGNOSIS — E1122 Type 2 diabetes mellitus with diabetic chronic kidney disease: Secondary | ICD-10-CM | POA: Diagnosis not present

## 2024-03-21 DIAGNOSIS — R194 Change in bowel habit: Secondary | ICD-10-CM | POA: Diagnosis present

## 2024-03-21 DIAGNOSIS — R569 Unspecified convulsions: Secondary | ICD-10-CM | POA: Insufficient documentation

## 2024-03-21 DIAGNOSIS — R456 Violent behavior: Secondary | ICD-10-CM | POA: Diagnosis not present

## 2024-03-21 DIAGNOSIS — I10 Essential (primary) hypertension: Secondary | ICD-10-CM | POA: Diagnosis not present

## 2024-03-21 LAB — RAPID URINE DRUG SCREEN, HOSP PERFORMED
Amphetamines: NOT DETECTED
Barbiturates: NOT DETECTED
Benzodiazepines: NOT DETECTED
Cocaine: NOT DETECTED
Opiates: NOT DETECTED
Tetrahydrocannabinol: NOT DETECTED

## 2024-03-21 LAB — COMPREHENSIVE METABOLIC PANEL WITH GFR
ALT: 23 U/L (ref 0–44)
AST: 26 U/L (ref 15–41)
Albumin: 3.6 g/dL (ref 3.5–5.0)
Alkaline Phosphatase: 107 U/L (ref 38–126)
Anion gap: 9 (ref 5–15)
BUN: 10 mg/dL (ref 8–23)
CO2: 26 mmol/L (ref 22–32)
Calcium: 8.6 mg/dL — ABNORMAL LOW (ref 8.9–10.3)
Chloride: 102 mmol/L (ref 98–111)
Creatinine, Ser: 1 mg/dL (ref 0.44–1.00)
GFR, Estimated: 59 mL/min — ABNORMAL LOW (ref >60)
Glucose, Bld: 123 mg/dL — ABNORMAL HIGH (ref 70–99)
Potassium: 3.3 mmol/L — ABNORMAL LOW (ref 3.5–5.1)
Sodium: 137 mmol/L (ref 135–145)
Total Bilirubin: 0.5 mg/dL (ref 0.0–1.2)
Total Protein: 7.3 g/dL (ref 6.5–8.1)

## 2024-03-21 LAB — CBG MONITORING, ED: Glucose-Capillary: 84 mg/dL (ref 70–99)

## 2024-03-21 LAB — MAGNESIUM: Magnesium: 2.3 mg/dL (ref 1.7–2.4)

## 2024-03-21 LAB — PREPARE RBC (CROSSMATCH)

## 2024-03-21 LAB — CBC WITH DIFFERENTIAL/PLATELET
Abs Immature Granulocytes: 0.01 K/uL (ref 0.00–0.07)
Basophils Absolute: 0 K/uL (ref 0.0–0.1)
Basophils Relative: 1 %
Eosinophils Absolute: 0 K/uL (ref 0.0–0.5)
Eosinophils Relative: 0 %
HCT: 25 % — ABNORMAL LOW (ref 36.0–46.0)
Hemoglobin: 7.5 g/dL — ABNORMAL LOW (ref 12.0–15.0)
Immature Granulocytes: 0 %
Lymphocytes Relative: 23 %
Lymphs Abs: 0.9 K/uL (ref 0.7–4.0)
MCH: 22.5 pg — ABNORMAL LOW (ref 26.0–34.0)
MCHC: 30 g/dL (ref 30.0–36.0)
MCV: 75.1 fL — ABNORMAL LOW (ref 80.0–100.0)
Monocytes Absolute: 0.3 K/uL (ref 0.1–1.0)
Monocytes Relative: 9 %
Neutro Abs: 2.5 K/uL (ref 1.7–7.7)
Neutrophils Relative %: 67 %
Platelets: 332 K/uL (ref 150–400)
RBC: 3.33 MIL/uL — ABNORMAL LOW (ref 3.87–5.11)
RDW: 15.9 % — ABNORMAL HIGH (ref 11.5–15.5)
WBC: 3.7 K/uL — ABNORMAL LOW (ref 4.0–10.5)
nRBC: 0 % (ref 0.0–0.2)

## 2024-03-21 LAB — URINALYSIS, ROUTINE W REFLEX MICROSCOPIC
Bilirubin Urine: NEGATIVE
Glucose, UA: NEGATIVE mg/dL
Hgb urine dipstick: NEGATIVE
Ketones, ur: NEGATIVE mg/dL
Leukocytes,Ua: NEGATIVE
Nitrite: NEGATIVE
Protein, ur: NEGATIVE mg/dL
Specific Gravity, Urine: 1.008 (ref 1.005–1.030)
pH: 7 (ref 5.0–8.0)

## 2024-03-21 LAB — POC OCCULT BLOOD, ED: Fecal Occult Bld: NEGATIVE

## 2024-03-21 MED ORDER — POTASSIUM CHLORIDE 20 MEQ PO PACK
40.0000 meq | PACK | Freq: Once | ORAL | Status: AC
  Administered 2024-03-21: 40 meq via ORAL
  Filled 2024-03-21: qty 2

## 2024-03-21 MED ORDER — LEVETIRACETAM (KEPPRA) 500 MG/5 ML ADULT IV PUSH
1000.0000 mg | Freq: Once | INTRAVENOUS | Status: AC
  Administered 2024-03-21: 1000 mg via INTRAVENOUS
  Filled 2024-03-21: qty 10

## 2024-03-21 MED ORDER — LAMOTRIGINE 25 MG PO TABS
200.0000 mg | ORAL_TABLET | Freq: Two times a day (BID) | ORAL | Status: DC
Start: 2024-03-21 — End: 2024-03-22
  Administered 2024-03-21: 200 mg via ORAL
  Filled 2024-03-21: qty 8

## 2024-03-21 MED ORDER — FUROSEMIDE 10 MG/ML IJ SOLN
40.0000 mg | Freq: Once | INTRAMUSCULAR | Status: AC
  Administered 2024-03-21: 40 mg via INTRAVENOUS
  Filled 2024-03-21: qty 4

## 2024-03-21 MED ORDER — SODIUM CHLORIDE 0.9% IV SOLUTION
Freq: Once | INTRAVENOUS | Status: DC
Start: 2024-03-21 — End: 2024-03-22

## 2024-03-21 NOTE — ED Provider Notes (Signed)
 [place] EMERGENCY DEPARTMENT AT [facility] Provider Note   CSN: [id number] Arrival date & time: 03/21/24  1107     Patient presents with: Seizures   [name] is a 77 y.o. female.   HPI Patient presents for seizure.  Medical history includes CHF, HLD, seizures, pulmonary pretension, anemia, OSA, DM, CKD, parkinsonism, atrial fibrillation.  She is prescribed Eliquis , amiodarone , Keppra , Lamictal .  Her neurologist is Dr. Georjean.  Per chart review, she was last seen in neurology office in May.  This followed an ED visit for hypoglycemic seizure.  Her current medications are Lamictal  200 mg twice daily and Keppra  1000 g at bedtime.  [name], family found her seizing in bed.  It was described as generalized.  When EMS arrived, she was longer seizing but did seem postictal.  She had a 10-second episode of full body stiffening witnessed by EMS.  No medications were given prior to arrival.  Currently, patient denies any areas of discomfort.    Prior to Admission medications   Medication Sig Start Date End Date Taking? Authorizing Provider  acetaminophen  (TYLENOL ) 500 MG tablet Take 500-1,000 mg by mouth every 4 (four) hours as needed for moderate pain or mild pain.    [provider]  albuterol  (VENTOLIN  HFA) 108 (90 Base) MCG/ACT inhaler Inhale 1-2 puffs into the lungs every 6 (six) hours as needed for wheezing or shortness of breath. 05/22/23   Jarold Medici, MD  Alcaftadine (LASTACAFT) 0.25 % SOLN Place 1-2 drops into both eyes daily as needed (allergy eye/irritation.).    [provider]  Alcohol  Swabs (DROPSAFE ALCOHOL  PREP) 70 % PADS USE TWO TIMES DAILY AS DIRECTED 08/20/23   Jarold Medici, MD  amiodarone  (PACERONE ) 100 MG tablet TAKE 1 TABLET EVERY DAY 03/09/24   McLean, Dalton S, MD  apixaban  (ELIQUIS ) 5 MG TABS tablet Take 1 tablet (5 mg total) by mouth 2 (two) times daily. 07/22/23   Milford, Harlene HERO, FNP  Ascorbic Acid (VITAMIN C) 1000 MG  tablet Take 1,000 mg by mouth in the [name].    [provider]  atorvastatin  (LIPITOR) 40 MG tablet TAKE 1 TABLET EVERY DAY 09/18/23   Jarold Medici, MD  B-D ULTRAFINE III SHORT PEN 31G X 8 MM MISC USE DAILY WITH VICTOZA  AND/OR NOVOLOG . 09/25/21   Jarold Medici, MD  benzonatate  (TESSALON ) 100 MG capsule Take 1 capsule by mouth three times daily as needed for cough 09/03/23   Jarold Medici, MD  Blood Glucose Monitoring Suppl (TRUE METRIX AIR GLUCOSE METER) w/Device KIT USE AS DIRECTED 05/27/23   Jarold Medici, MD  carvedilol  (COREG ) 12.5 MG tablet TAKE 1 TABLET TWICE DAILY 06/24/23   McLean, Dalton S, MD  Dasiglucagon  HCl (ZEGALOGUE ) 0.6 MG/0.6ML SOAJ Inject 0.6 mg into the skin daily as needed (use for extremely low blood sugars, less than 60). 09/25/23   Jarold Medici, MD  diclofenac  sodium (VOLTAREN ) 1 % GEL Apply 2 g topically 4 (four) times daily as needed (pain). 05/17/18   Vann, Jessica U, DO  ferrous sulfate  324 MG TBEC Take 324 mg by mouth daily.    [provider]  furosemide  (LASIX ) 20 MG tablet Take 4 tablets (80 mg total) by mouth twice a day. 07/22/23   Glena Harlene HERO, FNP  hydrALAZINE  (APRESOLINE ) 100 MG tablet TAKE 1 TABLET THREE TIMES DAILY (DOSE CHANGE, REPLACES PREVIOUS PRESCRIPTIONS) 03/09/24   McLean, Dalton S, MD  hydrocortisone cream 1 % Apply 1 application topically daily as needed for itching.  [provider]  Insulin  Glargine (TOUJEO  SOLOSTAR [place]) Inject 20 Units into the skin at bedtime.    [provider]  insulin  lispro (HUMALOG  KWIKPEN) 100 UNIT/ML KwikPen Inject 0-10 Units into the skin See admin instructions. Sliding scale:Inject  (high blood sugar over 150). 150-199=2 units;200-249=4 units,250-299=6 units;300-349=8 units;350 above 10 units: Over 350 call physician 01/14/24   Jarold Medici, MD  Isopropyl Alcohol  (ALCOHOL  WIPES) 70 % MISC Apply 1 each topically 2 (two) times daily. 06/25/19   Moore, Janece, FNP  isosorbide   mononitrate (IMDUR ) 30 MG 24 hr tablet Take 1 tablet (30 mg total) by mouth daily. 03/11/24   Anner Alm ORN, MD  lamoTRIgine  (LAMICTAL ) 200 MG tablet Take 1 tablet (200 mg total) by mouth 2 (two) times daily. 11/15/23   Georjean Darice HERO, MD  levETIRAcetam  (KEPPRA ) 500 MG tablet Take 2 tablets every night 11/15/23   Aquino, Karen M, MD  loratadine  (CLARITIN ) 10 MG tablet Take 1 tablet (10 mg total) by mouth daily. 10/01/22 02/06/25  Jarold Medici, MD  Multiple Vitamin (MULTIVITAMIN) tablet Take 1 tablet by mouth daily.    [provider]  omeprazole (PRILOSEC) 10 MG capsule Take 10 mg by mouth every Monday, Wednesday, and Friday.    [provider]  oxyCODONE  (OXY IR/ROXICODONE ) 5 MG immediate release tablet Take 5 mg by mouth every 6 (six) hours as needed (pain.). 11/01/22   [provider]  OZEMPIC , 1 MG/DOSE, 4 MG/3ML SOPN INJECT 1MG  UNDER THE SKIN ONE TIME WEEKLY ON [facility] 10/14/23   Jarold Medici, MD  Pancrelipase , Lip-Prot-Amyl, 25000-79000 units CPEP Take 2 capsules by mouth in the [name], at noon, and at bedtime.    [provider]  potassium chloride  SA (KLOR-CON  M) 20 MEQ tablet Take 3 tablets (60 mEq total) by mouth daily. 06/10/23   Rolan Ezra RAMAN, MD  sennosides-docusate sodium  (SENOKOT-S) 8.6-50 MG tablet Take 1-2 tablets by mouth at bedtime as needed for constipation.    [provider]  sertraline  (ZOLOFT ) 50 MG tablet TAKE 1 TABLET EVERY DAY 12/12/23   Jarold Medici, MD  tiZANidine  (ZANAFLEX ) 2 MG tablet TAKE 1 TABLET AT BEDTIME AS NEEDED 05/27/23   Jarold Medici, MD  tolnaftate (TINACTIN) 1 % cream Apply 1 application topically daily as needed (foot fungus).     [provider]  TRUE METRIX BLOOD GLUCOSE TEST test strip USE AS DIRECTED TO CHECK BLOOD SUGARS 2 TIMES PER DAY 05/27/23   Jarold Medici, MD  TRUEplus Lancets 33G MISC TEST BLOOD SUGAR TWICE DAILY AS DIRECTED 11/11/21   Jarold Medici, MD    Allergies: Other,  Penicillins, Sulfa antibiotics, Aspirin , and Codeine    Review of Systems  Unable to perform ROS: Mental status change  Neurological:  Positive for seizures.    Updated Vital Signs BP 121/63 (BP Location: Left Arm)   Pulse 61   Temp 99 F (37.2 C) (Oral)   Resp 19   SpO2 100%   Physical Exam Vitals and nursing note reviewed.  Constitutional:      General: She is not in acute distress.    Appearance: Normal appearance. She is well-developed. She is not ill-appearing, toxic-appearing or diaphoretic.  HENT:     Head: Normocephalic and atraumatic.     Right Ear: External ear normal.     Left Ear: External ear normal.     Nose: Nose normal.     Mouth/Throat:     Mouth: Mucous membranes are moist.  Eyes:  Extraocular Movements: Extraocular movements intact.     Conjunctiva/sclera: Conjunctivae normal.  Cardiovascular:     Rate and Rhythm: Normal rate and regular rhythm.  Pulmonary:     Effort: Pulmonary effort is normal. No respiratory distress.     Breath sounds: Normal breath sounds. No wheezing or rales.  Chest:     Chest wall: No tenderness.  Abdominal:     General: There is no distension.     Palpations: Abdomen is soft.     Tenderness: There is no abdominal tenderness.  Musculoskeletal:        General: No swelling. Normal range of motion.     Cervical back: Normal range of motion and neck supple.     Right lower leg: No edema.     Left lower leg: No edema.  Skin:    General: Skin is warm and dry.     Coloration: Skin is not jaundiced or pale.  Neurological:     General: No focal deficit present.     Mental Status: She is alert. She is disoriented.  Psychiatric:        Mood and Affect: Mood normal.        Behavior: Behavior normal.     (all labs ordered are listed, but only abnormal results are displayed) Labs Reviewed  COMPREHENSIVE METABOLIC PANEL WITH GFR - Abnormal; Notable for the following components:      Result Value   Potassium 3.3 (*)     Glucose, Bld 123 (*)    Calcium  8.6 (*)    GFR, Estimated 59 (*)    All other components within normal limits  CBC WITH DIFFERENTIAL/PLATELET - Abnormal; Notable for the following components:   WBC 3.7 (*)    RBC 3.33 (*)    Hemoglobin 7.5 (*)    HCT 25.0 (*)    MCV 75.1 (*)    MCH 22.5 (*)    RDW 15.9 (*)    All other components within normal limits  MAGNESIUM   URINALYSIS, ROUTINE W REFLEX MICROSCOPIC  RAPID URINE DRUG SCREEN, HOSP PERFORMED  LAMOTRIGINE  LEVEL  LEVETIRACETAM  LEVEL  CBG MONITORING, ED  CBG MONITORING, ED  POC OCCULT BLOOD, ED  TYPE AND SCREEN  PREPARE RBC (CROSSMATCH)    EKG: EKG Interpretation Date/Time:  Saturday March 21 2024 11:48:42 EDT Ventricular Rate:  66 PR Interval:  203 QRS Duration:  113 QT Interval:  430 QTC Calculation: 451 R Axis:   86  Text Interpretation: Sinus rhythm Incomplete right bundle branch block Abnormal R-wave progression, late transition Confirmed by Melvenia Motto ([id number]) on 03/21/2024 11:53:44 AM  Radiology: CT HEAD WO CONTRAST Result Date: 03/21/2024 EXAM: CT HEAD WITHOUT CONTRAST 03/21/2024 11:40:32 AM TECHNIQUE: CT of the head was performed without the administration of intravenous contrast. Automated exposure control, iterative reconstruction, and/or weight based adjustment of the mA/kV was utilized to reduce the radiation dose to as low as reasonably achievable. COMPARISON: CT head without contrast 04/02/2023 and MR head without contrast 07/06/2022. CLINICAL HISTORY: Mental status change, unknown cause. BIB [facility] from home for seizure observed by family, Post-ictal. Unsure how many seizures occurred. 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 Atrophy and white matter changes. No acute hemorrhage. No evidence of acute infarct. No hydrocephalus. No extra-axial collection. No mass effect or midline shift. ORBITS: Bilateral lens replacements are noted. The globes and orbits  are otherwise within normal limits. SINUSES: No acute abnormality. SOFT TISSUES AND SKULL: No acute soft  tissue abnormality. No skull fracture. IMPRESSION: 1. No acute intracranial abnormality. 2. Stable remote cortical infarct in the posterior right frontal lobe. 3. Atrophy and white matter changes. Electronically signed by: Lonni Necessary MD 03/21/2024 11:46 AM EDT RP Workstation: [id number]   DG Chest Port 1 View Result Date: 03/21/2024 CLINICAL DATA:  Seizure EXAM: PORTABLE CHEST 1 VIEW COMPARISON:  10/05/2022 FINDINGS: Single frontal view of the chest demonstrates an enlarged cardiac silhouette. No acute airspace disease, effusion, or pneumothorax. No acute bony abnormalities. IMPRESSION: 1. Enlarged cardiac silhouette. 2. No acute airspace disease. Electronically Signed   By: Ozell Daring M.D.   On: 03/21/2024 11:46     Procedures   Medications Ordered in the ED  lamoTRIgine  (LAMICTAL ) tablet 200 mg (200 mg Oral Given 03/21/24 1221)  0.9 %  sodium chloride  infusion (Manually program via Guardrails IV Fluids) (has no administration in time range)  levETIRAcetam  (KEPPRA ) undiluted injection 1,000 mg (1,000 mg Intravenous Given 03/21/24 1204)  potassium chloride  (KLOR-CON ) packet 40 mEq (40 mEq Oral Given 03/21/24 1657)  furosemide  (LASIX ) injection 40 mg (40 mg Intravenous Given 03/21/24 1657)                                    Medical Decision Making Amount and/or Complexity of Data Reviewed Labs: ordered. Radiology: ordered.  Risk Prescription drug management.   This patient presents to the ED for concern of seizure, this involves an extensive number of treatment options, and is a complaint that carries with it a high risk of complications and morbidity.  The differential diagnosis includes medication nonadherence, infection, metabolic derangements, occult injury   Co morbidities / Chronic conditions that complicate the patient evaluation  CHF, HLD, seizures, pulmonary  pretension, anemia, OSA, DM, CKD, parkinsonism, atrial fibrillation   Additional history obtained:  Additional history obtained from EMR External records from outside source obtained and reviewed including EMS, patient's son   Lab Tests:  I Ordered, and personally interpreted labs.  The pertinent results include: Acute on chronic anemia, baseline leukopenia, normal kidney function, hypokalemia with otherwise normal electrolytes   Imaging Studies ordered:  I ordered imaging studies including CT head, chest x-ray I independently visualized and interpreted imaging which showed no acute findings I agree with the radiologist interpretation   Cardiac Monitoring: / EKG:  The patient was maintained on a cardiac monitor.  I personally viewed and interpreted the cardiac monitored which showed an underlying rhythm of: Sinus rhythm   Problem List / ED Course / Critical interventions / Medication management  Patient presents for witnessed seizure activity [name].  She was postictal on EMS arrival but had a 10-second episode of seizure-like activity witnessed by EMS.  She arrives in the ED awake and alert.  She is able to tell me her name.  She is otherwise disoriented.  She is moving all extremities with good strength.  She denies any current areas of discomfort.  Per chart review, she does have a history of seizures and is prescribed mental and Keppra .  Dose of each were ordered.  Workup was initiated.  CT head was negative for acute findings.  On reassessment, patient has returned to mental baseline.  Her son is present at bedside at this time.  Patient reports that she has been taking her medications as prescribed.  She states that she felt fatigued yesterday but has otherwise been in her normal state of health lately.  Her lab work today shows a gradual decline in hemoglobin over the past several months.  She undergoes outpatient iron  infusions but has missed lately.  She states that she has  had a recent tooth extraction, recent nosebleeds, and her stool has been dark.  She thinks her stool may be dark from the oral iron  supplement.  DRE showed brown stool only.  This was Hemoccult negative.  Given her cardiac history, patient is below threshold to transfuse.  1 unit PRBCs was ordered.  Urinalysis did not show evidence of infection.  Patient remained at a mental baseline with no new symptoms during extended ED observation.  Following blood transfusion, she was discharged in stable condition. I ordered medication including Lamictal  and Keppra  for seizure prophylaxis; Lasix  for diuresis; potassium chloride  for hypokalemia; PRBCs for acute on chronic anemia Reevaluation of the patient after these medicines showed that the patient improved I have reviewed the patients home medicines and have made adjustments as needed  Social Determinants of Health:  Lives at home with family     Final diagnoses:  Seizure [facility])    ED Discharge Orders     None          Melvenia Motto, MD 03/21/24 [phone]

## 2024-03-21 NOTE — ED Notes (Signed)
 Son [name] [phone] would like an update asap

## 2024-03-21 NOTE — ED Notes (Signed)
 Patient transported to CT

## 2024-03-21 NOTE — Discharge Instructions (Addendum)
 Your test results today showed a worsening of your anemia.  You did receive 1 unit of blood transfusion in the emergency department.  Continue your iron  supplementation.  Continue your home seizure medications as prescribed.  Follow-up with your neurologist.  Return to the emergency department for any new or worsening symptoms of concern.

## 2024-03-21 NOTE — ED Triage Notes (Signed)
 BIB [facility] from home for seizure observed by family, Post-ictal. Unsure how many seizures occurred.  119/60  63HR  100 2L [place]  139 CBG.

## 2024-03-22 LAB — TYPE AND SCREEN
ABO/RH(D): B POS
Antibody Screen: NEGATIVE
Unit division: 0

## 2024-03-22 LAB — BPAM RBC
Blood Product Expiration Date: 202510072359
ISSUE DATE / TIME: 202509271621
Unit Type and Rh: 7300

## 2024-03-23 ENCOUNTER — Encounter: Payer: Self-pay | Admitting: Internal Medicine

## 2024-03-23 LAB — LEVETIRACETAM LEVEL: Levetiracetam Lvl: 20.8 ug/mL (ref 10.0–40.0)

## 2024-03-23 LAB — LAMOTRIGINE LEVEL: Lamotrigine Lvl: 13 ug/mL (ref 2.0–20.0)

## 2024-03-26 ENCOUNTER — Encounter: Payer: Self-pay | Admitting: Internal Medicine

## 2024-03-26 ENCOUNTER — Ambulatory Visit: Payer: Self-pay | Admitting: Internal Medicine

## 2024-03-26 VITALS — BP 110/68 | HR 62 | Temp 98.3°F | Ht 61.0 in | Wt 176.4 lb

## 2024-03-26 DIAGNOSIS — Z6833 Body mass index (BMI) 33.0-33.9, adult: Secondary | ICD-10-CM

## 2024-03-26 DIAGNOSIS — E1122 Type 2 diabetes mellitus with diabetic chronic kidney disease: Secondary | ICD-10-CM | POA: Diagnosis not present

## 2024-03-26 DIAGNOSIS — N182 Chronic kidney disease, stage 2 (mild): Secondary | ICD-10-CM | POA: Diagnosis not present

## 2024-03-26 DIAGNOSIS — R269 Unspecified abnormalities of gait and mobility: Secondary | ICD-10-CM

## 2024-03-26 DIAGNOSIS — R040 Epistaxis: Secondary | ICD-10-CM

## 2024-03-26 DIAGNOSIS — E6609 Other obesity due to excess calories: Secondary | ICD-10-CM | POA: Diagnosis not present

## 2024-03-26 DIAGNOSIS — F331 Major depressive disorder, recurrent, moderate: Secondary | ICD-10-CM | POA: Diagnosis not present

## 2024-03-26 DIAGNOSIS — D508 Other iron deficiency anemias: Secondary | ICD-10-CM

## 2024-03-26 DIAGNOSIS — R296 Repeated falls: Secondary | ICD-10-CM | POA: Diagnosis not present

## 2024-03-26 DIAGNOSIS — Z794 Long term (current) use of insulin: Secondary | ICD-10-CM

## 2024-03-26 DIAGNOSIS — F339 Major depressive disorder, recurrent, unspecified: Secondary | ICD-10-CM

## 2024-03-26 DIAGNOSIS — E66811 Obesity, class 1: Secondary | ICD-10-CM | POA: Diagnosis not present

## 2024-03-26 DIAGNOSIS — R413 Other amnesia: Secondary | ICD-10-CM | POA: Diagnosis not present

## 2024-03-26 DIAGNOSIS — Z23 Encounter for immunization: Secondary | ICD-10-CM

## 2024-03-26 DIAGNOSIS — Z5982 Transportation insecurity: Secondary | ICD-10-CM

## 2024-03-26 DIAGNOSIS — Z638 Other specified problems related to primary support group: Secondary | ICD-10-CM

## 2024-03-26 LAB — CBC
Hematocrit: 26.4 % — ABNORMAL LOW (ref 34.0–46.6)
Hemoglobin: 7.8 g/dL — ABNORMAL LOW (ref 11.1–15.9)
MCH: 22.6 pg — ABNORMAL LOW (ref 26.6–33.0)
MCHC: 29.5 g/dL — ABNORMAL LOW (ref 31.5–35.7)
MCV: 77 fL — ABNORMAL LOW (ref 79–97)
Platelets: 321 x10E3/uL (ref 150–450)
RBC: 3.45 x10E6/uL — ABNORMAL LOW (ref 3.77–5.28)
RDW: 15.5 % — ABNORMAL HIGH (ref 11.7–15.4)
WBC: 4 x10E3/uL (ref 3.4–10.8)

## 2024-03-26 NOTE — Progress Notes (Signed)
 I,Victoria T Emmitt, CMA,acting as a Neurosurgeon for Catheryn LOISE Slocumb, MD.,have documented all relevant documentation on the behalf of Catheryn LOISE Slocumb, MD,as directed by  Catheryn LOISE Slocumb, MD while in the presence of Catheryn LOISE Slocumb, MD.  Subjective:  Patient ID: [name] , female    DOB: 05/03/1946 , 78 y.o.   MRN: [id number]  Chief Complaint  Patient presents with   MEDICAL ADVICE    Patient presents today wanting to discuss things going on at home. She currently lives with her sister & husband of sister. She expresses how much her sister has gotten on her nerves.  She also had a seizure recently, at the end of September. Went to ED on 9/27 for this issue. She does not know if this is stress induced. Connected to her home issues. She admits the seizures happen randomly. She also has felt more fatigue. Denies fever/ chills.  She reports bs readings: 89, 64,63.     HPI Discussed the use of AI scribe software for clinical note transcription with the patient, who gave verbal consent to proceed.  History of Present Illness [name] is a 78 year old female with depression and osteoarthritis who presents with concerns about memory and mood.  She has concerns about her memory and mood, with mood fluctuations exacerbated by her stressful living situation. She feels uncomfortable in her home environment, particularly when family members are present, leading her to isolate herself in her room. She has a history of depression and was under psychiatric care over twenty years ago but discontinued sessions. Currently, she takes sertraline , a 'little tiny blue pill at night,' for her mood.  She experiences difficulty with transportation, preferring the I ride service over the [facility] service due to discomfort with the latter's route. She has completed paperwork for I ride and requires assistance with the process. Due to osteoarthritis affecting her knees, hips, and feet, she is unable to walk to the bus  stop and uses a walker in addition to a cane. She reports a recent fall on her deck and another fall three months ago, attributing the latter to her dog tripping her. She expresses fear of falling and is unable to cross the street without assistance.  She has a history of anemia and recently received a unit of blood during a hospital visit. She takes over-the-counter iron  supplements and had an iron  infusion on September 3rd. She feels tired, which she attributes to her anemia. She also experiences occasional epistaxis and checks her blood pressure during these episodes. She uses a CPAP machine and takes vitamin D  supplements.  She has diabetes with a last A1c of 7.3. She takes Toujeo  insulin , which she adjusted to 25 units, although her chart indicates 20 units. Her morning blood sugar levels range from 61 to 91.    HPI   Past Medical History:  Diagnosis Date   Abnormal liver function     in the past.   Anemia    Arthritis    all over   Bruises easily    Cataract    right eye;immature   CHF (congestive heart failure) (HCC)    Chronic back pain    stenosis   Chronic cough    Chronic kidney disease    COPD (chronic obstructive pulmonary disease) (HCC)    COVID    aug/sept 2021   [place] myocardial infarction [facility]) 2012   [place] Infarction in setting of Pancreatitis --> mild Troponin elevation, NON-OBSTRUCTIVE CAD  Depression    takes Abilify  daily as well as Zoloft    Diastolic heart failure 2010   Grade 1 diastolic Dysfunction by Echo    Diverticulosis    DM (diabetes mellitus) (HCC)    takes Victoza  daily as well as Lantus  and Humalog    Empty sella    on MRI in 2009.   Glaucoma    Headache(784.0)    last migraine-4-21yrs ago   History of blood transfusion    no abnormal reaction noted   History of colon polyps    benign   HTN (hypertension)    takes Benicar ,Imdur ,and Bystolic  daily   Hyperlipidemia    takes Lipitor daily   Joint swelling    Nocturia    Obesity  hypoventilation syndrome (HCC)    Obstructive sleep apnea 02/2018   Notably improved split-night study with weight loss from 270 (2017) down to 250 pounds (2019).   Pancreatitis    takes Pancrelipase  daily   Parkinson's disease (HCC)    takes Sinemet  daily   Peripheral neuropathy    Pneumonia 2012   Seizures (HCC)    takes Lamictal  daily and Primidone  nightly;last seizure 2wks ago   Urinary urgency    With increased frequency   Varicose veins of both lower extremities with pain    With edema.  Takes daily Lasix      Family History  Problem Relation Age of Onset   Allergies Father    Heart disease Father        before 39   Heart failure Father    Hypertension Father    Hyperlipidemia Father    Heart disease Mother    Diabetes Mother    Hypertension Mother    Hyperlipidemia Mother    Hypertension Sister    Heart disease Sister        before 22   Hyperlipidemia Sister    Diabetes Brother    Hypertension Brother    Diabetes Sister    Hypertension Sister    Diabetes Son    Hypertension Son    Cancer Other      Current Outpatient Medications:    acetaminophen  (TYLENOL ) 500 MG tablet, Take 500-1,000 mg by mouth every 4 (four) hours as needed for moderate pain or mild pain., Disp: , Rfl:    albuterol  (VENTOLIN  HFA) 108 (90 Base) MCG/ACT inhaler, Inhale 1-2 puffs into the lungs every 6 (six) hours as needed for wheezing or shortness of breath., Disp: 17 each, Rfl: 5   Alcaftadine (LASTACAFT) 0.25 % SOLN, Place 1-2 drops into both eyes daily as needed (allergy eye/irritation.)., Disp: , Rfl:    Alcohol  Swabs (DROPSAFE ALCOHOL  PREP) 70 % PADS, USE TWO TIMES DAILY AS DIRECTED, Disp: 200 each, Rfl: 3   amiodarone  (PACERONE ) 100 MG tablet, TAKE 1 TABLET EVERY DAY, Disp: 90 tablet, Rfl: 3   apixaban  (ELIQUIS ) 5 MG TABS tablet, Take 1 tablet (5 mg total) by mouth 2 (two) times daily., Disp: 180 tablet, Rfl: 3   Ascorbic Acid (VITAMIN C) 1000 MG tablet, Take 1,000 mg by mouth in the  morning., Disp: , Rfl:    atorvastatin  (LIPITOR) 40 MG tablet, TAKE 1 TABLET EVERY DAY, Disp: 90 tablet, Rfl: 3   B-D ULTRAFINE III SHORT PEN 31G X 8 MM MISC, USE DAILY WITH VICTOZA  AND/OR NOVOLOG ., Disp: 400 each, Rfl: 2   benzonatate  (TESSALON ) 100 MG capsule, Take 1 capsule by mouth three times daily as needed for cough, Disp: 30 capsule, Rfl: 0   Blood Glucose  Monitoring Suppl (TRUE METRIX AIR GLUCOSE METER) w/Device KIT, USE AS DIRECTED, Disp: 1 kit, Rfl: 3   carvedilol  (COREG ) 12.5 MG tablet, TAKE 1 TABLET TWICE DAILY, Disp: 180 tablet, Rfl: 3   Dasiglucagon  HCl (ZEGALOGUE ) 0.6 MG/0.6ML SOAJ, Inject 0.6 mg into the skin daily as needed (use for extremely low blood sugars, less than 60)., Disp: 0.6 mL, Rfl: 3   diclofenac  sodium (VOLTAREN ) 1 % GEL, Apply 2 g topically 4 (four) times daily as needed (pain)., Disp: , Rfl:    ferrous sulfate  324 MG TBEC, Take 324 mg by mouth daily., Disp: , Rfl:    furosemide  (LASIX ) 20 MG tablet, Take 4 tablets (80 mg total) by mouth twice a day., Disp: 720 tablet, Rfl: 3   hydrALAZINE  (APRESOLINE ) 100 MG tablet, TAKE 1 TABLET THREE TIMES DAILY (DOSE CHANGE, REPLACES PREVIOUS PRESCRIPTIONS), Disp: 270 tablet, Rfl: 3   hydrocortisone cream 1 %, Apply 1 application topically daily as needed for itching. , Disp: , Rfl:    Insulin  Glargine (TOUJEO  SOLOSTAR [place]), Inject 20 Units into the skin at bedtime., Disp: , Rfl:    insulin  lispro (HUMALOG  KWIKPEN) 100 UNIT/ML KwikPen, Inject 0-10 Units into the skin See admin instructions. Sliding scale:Inject  (high blood sugar over 150). 150-199=2 units;200-249=4 units,250-299=6 units;300-349=8 units;350 above 10 units: Over 350 call physician, Disp: 15 mL, Rfl: 2   Isopropyl Alcohol  (ALCOHOL  WIPES) 70 % MISC, Apply 1 each topically 2 (two) times daily., Disp: 300 each, Rfl: 3   isosorbide  mononitrate (IMDUR ) 30 MG 24 hr tablet, Take 1 tablet (30 mg total) by mouth daily., Disp: 30 tablet, Rfl: 0   lamoTRIgine  (LAMICTAL ) 200 MG  tablet, Take 1 tablet (200 mg total) by mouth 2 (two) times daily., Disp: 180 tablet, Rfl: 3   levETIRAcetam  (KEPPRA ) 500 MG tablet, Take 2 tablets every night, Disp: 180 tablet, Rfl: 3   loratadine  (CLARITIN ) 10 MG tablet, Take 1 tablet (10 mg total) by mouth daily., Disp: 30 tablet, Rfl: 2   Multiple Vitamin (MULTIVITAMIN) tablet, Take 1 tablet by mouth daily., Disp: , Rfl:    omeprazole (PRILOSEC) 10 MG capsule, Take 10 mg by mouth every Monday, Wednesday, and Friday., Disp: , Rfl:    oxyCODONE  (OXY IR/ROXICODONE ) 5 MG immediate release tablet, Take 5 mg by mouth every 6 (six) hours as needed (pain.)., Disp: , Rfl:    OZEMPIC , 1 MG/DOSE, 4 MG/3ML SOPN, INJECT 1MG  UNDER THE SKIN ONE TIME WEEKLY ON WEDNESDAYS, Disp: 9 mL, Rfl: 3   Pancrelipase , Lip-Prot-Amyl, 25000-79000 units CPEP, Take 2 capsules by mouth in the morning, at noon, and at bedtime., Disp: , Rfl:    potassium chloride  SA (KLOR-CON  M) 20 MEQ tablet, Take 3 tablets (60 mEq total) by mouth daily., Disp: 300 tablet, Rfl: 3   sennosides-docusate sodium  (SENOKOT-S) 8.6-50 MG tablet, Take 1-2 tablets by mouth at bedtime as needed for constipation., Disp: , Rfl:    sertraline  (ZOLOFT ) 50 MG tablet, TAKE 1 TABLET EVERY DAY, Disp: 90 tablet, Rfl: 3   tiZANidine  (ZANAFLEX ) 2 MG tablet, TAKE 1 TABLET AT BEDTIME AS NEEDED, Disp: 30 tablet, Rfl: 11   tolnaftate (TINACTIN) 1 % cream, Apply 1 application topically daily as needed (foot fungus). , Disp: , Rfl:    TRUE METRIX BLOOD GLUCOSE TEST test strip, USE AS DIRECTED TO CHECK BLOOD SUGARS 2 TIMES PER DAY, Disp: 200 strip, Rfl: 3   TRUEplus Lancets 33G MISC, TEST BLOOD SUGAR TWICE DAILY AS DIRECTED, Disp: 200 each, Rfl: 3 No  current facility-administered medications for this visit.  Facility-Administered Medications Ordered in Other Visits:    0.9 %  sodium chloride  infusion (Manually program via Guardrails IV Fluids), , Intravenous, Once, [place], Jessica M, FNP   0.9 %  sodium chloride   infusion (Manually program via Guardrails IV Fluids), , Intravenous, Once, [place], Jessica M, FNP   Allergies  Allergen Reactions   Other Anaphylaxis and Rash    Bleach Cigarette smoke/ Cough   Penicillins Hives    Did it involve swelling of the face/tongue/throat, SOB, or low BP? No Did it involve sudden or severe rash/hives, skin peeling, or any reaction on the inside of your mouth or nose? Yes Did you need to seek medical attention at a hospital or doctor's office? Yes When did it last happen?      Childhood allergy  If all above answers are "NO", may proceed with cephalosporin use.     Sulfa Antibiotics Hives   Aspirin  Other (See Comments)     On aspirin  81 mg - Rectal bleeding in dec 2018   Codeine     Headache and makes the patient feel off     Review of Systems  Constitutional: Negative.   Respiratory: Negative.    Cardiovascular: Negative.   Gastrointestinal: Negative.  Negative for abdominal distention.  Neurological: Negative.   Psychiatric/Behavioral: Negative.       Today's Vitals   03/26/24 1510  BP: 110/68  Pulse: 62  Temp: 98.3 F (36.8 C)  SpO2: 98%  Weight: 176 lb 6.4 oz (80 kg)  Height: 5' 1 (1.549 m)   Body mass index is 33.33 kg/m.  Wt Readings from Last 3 Encounters:  03/26/24 176 lb 6.4 oz (80 kg)  02/26/24 176 lb 14.4 oz (80.2 kg)  02/07/24 181 lb 12.8 oz (82.5 kg)     Objective:  Physical Exam Vitals and nursing note reviewed.  Constitutional:      Appearance: Normal appearance. She is obese.  HENT:     Head: Normocephalic and atraumatic.  Eyes:     Extraocular Movements: Extraocular movements intact.  Cardiovascular:     Rate and Rhythm: Normal rate and regular rhythm.     Heart sounds: Normal heart sounds.  Pulmonary:     Effort: Pulmonary effort is normal.     Breath sounds: Normal breath sounds.  Musculoskeletal:     Cervical back: Normal range of motion.     Comments: Ambulatory with cane  Skin:    General: Skin is  warm.  Neurological:     General: No focal deficit present.     Mental Status: She is alert.  Psychiatric:        Mood and Affect: Mood normal.        Behavior: Behavior normal.         Assessment And Plan:  Moderate recurrent major depression (HCC) Assessment & Plan: Mood fluctuations and memory issues possibly due to stress and living conditions. On sertraline . Stress and depression can impact memory. - Refer to therapist for evaluation and management. - Continue sertraline  as prescribed. - Reassess sertraline  dosage post-therapy evaluation.  Orders: -     Amb ref to [facility]  Memory changes Assessment & Plan: We discussed how her mood can affect her memory.  She agrees to referral to [facility] for therapy.     Recurrent falls Assessment & Plan: Moderate to severe osteoarthritis in knees, hips, and feet causing mobility issues and falls. Uses walker and cane. Recent fall and  fear of falling. - Consider physical therapy for fall prevention and mobility. - Evaluate need for community assistance during transportation.  Orders: -     Ambulatory referral to Home Health  Gait abnormality -     Ambulatory referral to Home Health  Type 2 diabetes mellitus with stage 2 chronic kidney disease, with long-term current use of insulin  (HCC) Assessment & Plan: Diabetes well-controlled, recent A1c 7.3. On Toujeo  insulin .  - Adjust Toujeo  insulin  to 20 units. - Monitor blood sugar levels regularly.  Orders: -     Ambulatory referral to Home Health  Other iron  deficiency anemia Assessment & Plan: Anemia with recent iron  infusion. Fatigue and epistaxis reported. Low hemoglobin, received blood transfusion. - Check blood count today. - Provide stronger iron  supplement samples. - Recheck iron  levels at next visit. - Monitor blood pressure during epistaxis. - Consider humidifier and Vaseline for nostrils to prevent epistaxis.  Orders: -     CBC  Class 1 obesity  due to excess calories with serious comorbidity and body mass index (BMI) of 33.0 to 33.9 in adult Assessment & Plan: She is encouraged to strive for BMI less than 30 to decrease cardiac risk. Advised to aim for at least 150 minutes of exercise per week.    Stress due to family tension -     Amb ref to [facility]  Transportation insecurity Assessment & Plan: Transportation and living situation concerns Prefers I ride service over [facility]. Lives in stressful environment but prefers current residence. - Assist with paperwork for I ride service. - Discuss transportation options and preferences.   Immunization due -     Flu vaccine HIGH DOSE PF(Fluzone Trivalent)   Patient and/or legal guardian verbally consented to [facility] services about presenting concerns and psychiatric consultation as appropriate.  The services will be billed as appropriate for the patient.   Return if symptoms worsen or fail to improve.  Patient was given opportunity to ask questions. Patient verbalized understanding of the plan and was able to repeat key elements of the plan. All questions were answered to their satisfaction.   I, Catheryn LOISE Slocumb, MD, have reviewed all documentation for this visit. The documentation on 03/26/24 for the exam, diagnosis, procedures, and orders are all accurate and complete.   IF YOU HAVE BEEN REFERRED TO A SPECIALIST, IT MAY TAKE 1-2 WEEKS TO SCHEDULE/PROCESS THE REFERRAL. IF YOU HAVE NOT HEARD FROM US /SPECIALIST IN TWO WEEKS, PLEASE GIVE US  A CALL AT [phone].   THE PATIENT IS ENCOURAGED TO PRACTICE SOCIAL DISTANCING DUE TO THE COVID-19 PANDEMIC.

## 2024-03-28 ENCOUNTER — Ambulatory Visit: Payer: Self-pay | Admitting: Internal Medicine

## 2024-03-29 DIAGNOSIS — R296 Repeated falls: Secondary | ICD-10-CM | POA: Insufficient documentation

## 2024-03-29 DIAGNOSIS — Z5982 Transportation insecurity: Secondary | ICD-10-CM | POA: Insufficient documentation

## 2024-03-29 DIAGNOSIS — Z638 Other specified problems related to primary support group: Secondary | ICD-10-CM | POA: Insufficient documentation

## 2024-03-29 NOTE — Assessment & Plan Note (Signed)
 Diabetes well-controlled, recent A1c 7.3. On Toujeo  insulin .  - Adjust Toujeo  insulin  to 20 units. - Monitor blood sugar levels regularly.

## 2024-03-29 NOTE — Assessment & Plan Note (Signed)
 Transportation and living situation concerns Prefers I ride service over [facility]. Lives in stressful environment but prefers current residence. - Assist with paperwork for I ride service. - Discuss transportation options and preferences.

## 2024-03-29 NOTE — Assessment & Plan Note (Signed)
 Moderate to severe osteoarthritis in knees, hips, and feet causing mobility issues and falls. Uses walker and cane. Recent fall and fear of falling. - Consider physical therapy for fall prevention and mobility. - Evaluate need for community assistance during transportation.

## 2024-03-29 NOTE — Patient Instructions (Signed)
 It is important to avoid accidents which may result in broken bones.  Here are a few ideas on how to make your home safer so you will be less likely to trip or fall.  Use nonskid mats or non slip strips in your shower or tub, on your bathroom floor and around sinks.  If you know that you have spilled water, wipe it up! In the bathroom, it is important to have properly installed grab bars on the walls or on the edge of the tub.  Towel racks are NOT strong enough for you to hold onto or to pull on for support. Stairs and hallways should have enough light.  Add lamps or night lights if you need ore light. It is good to have handrails on both sides of the stairs if possible.  Always fix broken handrails right away. It is important to see the edges of steps.  Paint the edges of outdoor steps white so you can see them better.  Put colored tape on the edge of inside steps. Throw-rugs are dangerous because they can slide.  Removing the rugs is the best idea, but if they must stay, add adhesive carpet tape to prevent slipping. Do not keep things on stairs or in the halls.  Remove small furniture that blocks the halls as it may cause you to trip.  Keep telephone and electrical cords out of the way where you walk. Always were sturdy, rubber-soled shoes for good support.  Never wear just socks, especially on the stairs.  Socks may cause you to slip or fall.  Do not wear full-length housecoats as you can easily trip on the bottom.  Place the things you use the most on the shelves that are the easiest to reach.  If you use a stepstool, make sure it is in good condition.  If you feel unsteady, DO NOT climb, ask for help. If a health professional advises you to use a cane or walker, do not be ashamed.  These items can keep you from falling and breaking your bones.

## 2024-03-29 NOTE — Assessment & Plan Note (Signed)
 Anemia with recent iron  infusion. Fatigue and epistaxis reported. Low hemoglobin, received blood transfusion. - Check blood count today. - Provide stronger iron  supplement samples. - Recheck iron  levels at next visit. - Monitor blood pressure during epistaxis. - Consider humidifier and Vaseline for nostrils to prevent epistaxis.

## 2024-03-29 NOTE — Assessment & Plan Note (Signed)
 We discussed how her mood can affect her memory.  She agrees to referral to [facility] for therapy.

## 2024-03-29 NOTE — Assessment & Plan Note (Signed)
 She is encouraged to strive for BMI less than 30 to decrease cardiac risk. Advised to aim for at least 150 minutes of exercise per week.

## 2024-03-29 NOTE — Assessment & Plan Note (Signed)
 Mood fluctuations and memory issues possibly due to stress and living conditions. On sertraline . Stress and depression can impact memory. - Refer to therapist for evaluation and management. - Continue sertraline  as prescribed. - Reassess sertraline  dosage post-therapy evaluation.

## 2024-04-01 ENCOUNTER — Other Ambulatory Visit: Payer: Self-pay | Admitting: Internal Medicine

## 2024-04-01 DIAGNOSIS — N1831 Chronic kidney disease, stage 3a: Secondary | ICD-10-CM

## 2024-04-02 ENCOUNTER — Other Ambulatory Visit: Payer: Self-pay | Admitting: Cardiology

## 2024-04-02 DIAGNOSIS — E1122 Type 2 diabetes mellitus with diabetic chronic kidney disease: Secondary | ICD-10-CM | POA: Diagnosis not present

## 2024-04-02 DIAGNOSIS — M170 Bilateral primary osteoarthritis of knee: Secondary | ICD-10-CM | POA: Diagnosis not present

## 2024-04-02 DIAGNOSIS — M19072 Primary osteoarthritis, left ankle and foot: Secondary | ICD-10-CM | POA: Diagnosis not present

## 2024-04-02 DIAGNOSIS — M19071 Primary osteoarthritis, right ankle and foot: Secondary | ICD-10-CM | POA: Diagnosis not present

## 2024-04-02 DIAGNOSIS — I130 Hypertensive heart and chronic kidney disease with heart failure and stage 1 through stage 4 chronic kidney disease, or unspecified chronic kidney disease: Secondary | ICD-10-CM | POA: Diagnosis not present

## 2024-04-02 DIAGNOSIS — N182 Chronic kidney disease, stage 2 (mild): Secondary | ICD-10-CM | POA: Diagnosis not present

## 2024-04-02 DIAGNOSIS — M160 Bilateral primary osteoarthritis of hip: Secondary | ICD-10-CM | POA: Diagnosis not present

## 2024-04-02 DIAGNOSIS — I5032 Chronic diastolic (congestive) heart failure: Secondary | ICD-10-CM | POA: Diagnosis not present

## 2024-04-08 DIAGNOSIS — G40909 Epilepsy, unspecified, not intractable, without status epilepticus: Secondary | ICD-10-CM | POA: Diagnosis not present

## 2024-04-08 DIAGNOSIS — I509 Heart failure, unspecified: Secondary | ICD-10-CM | POA: Diagnosis not present

## 2024-04-08 DIAGNOSIS — G20A1 Parkinson's disease without dyskinesia, without mention of fluctuations: Secondary | ICD-10-CM | POA: Diagnosis not present

## 2024-04-08 DIAGNOSIS — I272 Pulmonary hypertension, unspecified: Secondary | ICD-10-CM | POA: Diagnosis not present

## 2024-04-08 DIAGNOSIS — N1832 Chronic kidney disease, stage 3b: Secondary | ICD-10-CM | POA: Diagnosis not present

## 2024-04-08 DIAGNOSIS — I130 Hypertensive heart and chronic kidney disease with heart failure and stage 1 through stage 4 chronic kidney disease, or unspecified chronic kidney disease: Secondary | ICD-10-CM | POA: Diagnosis not present

## 2024-04-08 DIAGNOSIS — J8410 Pulmonary fibrosis, unspecified: Secondary | ICD-10-CM | POA: Diagnosis not present

## 2024-04-08 DIAGNOSIS — I4891 Unspecified atrial fibrillation: Secondary | ICD-10-CM | POA: Diagnosis not present

## 2024-04-08 DIAGNOSIS — J4489 Other specified chronic obstructive pulmonary disease: Secondary | ICD-10-CM | POA: Diagnosis not present

## 2024-04-13 ENCOUNTER — Telehealth: Payer: Self-pay | Admitting: Internal Medicine

## 2024-04-13 NOTE — Telephone Encounter (Unsigned)
 Copied from CRM #[id number]. Topic: Referral - Request for Referral >> Apr 13, 2024  3:43 PM Donee H wrote: Did the patient discuss referral with their provider in the last year? No   Appointment offered? No  Type of order/referral and detailed reason for visit: Patient requesting referral for a Podiatrist.  Home Health stated to patient looks like she need to have feet checked  Preference of office, provider, location: No preference but would like to stay in [facility]  If referral order, have you been seen by this specialty before? No  Can we respond through MyChart? Yes

## 2024-04-25 ENCOUNTER — Other Ambulatory Visit: Payer: Self-pay | Admitting: Cardiology

## 2024-04-27 DIAGNOSIS — M170 Bilateral primary osteoarthritis of knee: Secondary | ICD-10-CM | POA: Diagnosis not present

## 2024-04-29 ENCOUNTER — Ambulatory Visit (INDEPENDENT_AMBULATORY_CARE_PROVIDER_SITE_OTHER): Payer: Self-pay | Admitting: Internal Medicine

## 2024-04-29 ENCOUNTER — Encounter: Payer: Self-pay | Admitting: Internal Medicine

## 2024-04-29 VITALS — BP 120/70 | HR 69 | Temp 98.4°F | Ht 61.0 in | Wt 175.0 lb

## 2024-04-29 DIAGNOSIS — D508 Other iron deficiency anemias: Secondary | ICD-10-CM | POA: Diagnosis not present

## 2024-04-29 DIAGNOSIS — E662 Morbid (severe) obesity with alveolar hypoventilation: Secondary | ICD-10-CM

## 2024-04-29 DIAGNOSIS — N1831 Chronic kidney disease, stage 3a: Secondary | ICD-10-CM

## 2024-04-29 DIAGNOSIS — E1122 Type 2 diabetes mellitus with diabetic chronic kidney disease: Secondary | ICD-10-CM | POA: Diagnosis not present

## 2024-04-29 DIAGNOSIS — Z8739 Personal history of other diseases of the musculoskeletal system and connective tissue: Secondary | ICD-10-CM | POA: Diagnosis not present

## 2024-04-29 DIAGNOSIS — I130 Hypertensive heart and chronic kidney disease with heart failure and stage 1 through stage 4 chronic kidney disease, or unspecified chronic kidney disease: Secondary | ICD-10-CM | POA: Diagnosis not present

## 2024-04-29 DIAGNOSIS — I5032 Chronic diastolic (congestive) heart failure: Secondary | ICD-10-CM | POA: Diagnosis not present

## 2024-04-29 DIAGNOSIS — Z794 Long term (current) use of insulin: Secondary | ICD-10-CM

## 2024-04-29 DIAGNOSIS — Z23 Encounter for immunization: Secondary | ICD-10-CM

## 2024-04-29 DIAGNOSIS — G40909 Epilepsy, unspecified, not intractable, without status epilepticus: Secondary | ICD-10-CM | POA: Diagnosis not present

## 2024-04-29 DIAGNOSIS — F331 Major depressive disorder, recurrent, moderate: Secondary | ICD-10-CM

## 2024-04-29 DIAGNOSIS — R269 Unspecified abnormalities of gait and mobility: Secondary | ICD-10-CM

## 2024-04-29 NOTE — Assessment & Plan Note (Signed)
 History of gout with previous hand involvement. Uric acid level needs checking. - Ordered uric acid level test.

## 2024-04-29 NOTE — Patient Instructions (Signed)

## 2024-04-29 NOTE — Assessment & Plan Note (Signed)
 On Aquacel for iron  supplementation. Mild constipation reported. Hematology follow-up scheduled. - Continue Aquacel for iron  supplementation. - Follow up with hematology for blood work in December.

## 2024-04-29 NOTE — Assessment & Plan Note (Signed)
 Seizure disorder managed with Lamictal  and Keppra . No recent Todd's paralysis. Neurology follow-up scheduled. - Continue Lamictal  200 mg twice daily. - Continue Keppra  500 mg twice daily. - Follow up with neurology in January.

## 2024-04-29 NOTE — Assessment & Plan Note (Signed)
 Chronic, well controlled.  Blood pressure managed with carvedilol  and hydralazine . Lasix  for diuretic effect. Potassium supplementation due to Lasix . - Continue carvedilol  twice daily. - Continue hydralazine  three times daily. - Continue Lasix  80 mg daily. - Continue potassium supplementation. - Follow low sodium diet.

## 2024-04-29 NOTE — Progress Notes (Addendum)
 I,Victoria T Emmitt, CMA,acting as a neurosurgeon for Catheryn LOISE Slocumb, MD.,have documented all relevant documentation on the behalf of Catheryn LOISE Slocumb, MD,as directed by  Catheryn LOISE Slocumb, MD while in the presence of Catheryn LOISE Slocumb, MD.  Subjective:  Patient ID: [name] , female    DOB: September 12, 1945 , 78 y.o.   MRN: [id number]  Chief Complaint  Patient presents with   Diabetes    Patient presents today for dm & bpc. She reports compliance with medications. Denies headache, chest pain & sob.     HPI Discussed the use of AI scribe software for clinical note transcription with the patient, who gave verbal consent to proceed.  History of Present Illness [name] is a 78 year old female with diabetes and hypertension who presents for a routine check-up.  Blood sugar levels have been generally stable, with occasional readings such as 161 mg/dL and 847 mg/dL, and some lower readings around 72 mg/dL and 90 mg/dL. A high reading of 285 mg/dL was noted as an outlier. Her regimen includes sliding scale insulin  and 20 units of Toujeo  at night. She also takes Lantus  and monitors her blood glucose with a glucometer.  She has a history of hypertension and is taking carvedilol  twice a day, hydralazine  three times a day, and Lasix  20 mg four times a day. The Lasix  dosage is broken down into multiple pills, totaling 80 mg per day.  She has a history of seizures and is on Keppra  500 mg twice a day and Lamictal  200 mg twice a day. She recalls a seizure episode a few months ago but did not experience Todd's paralysis at that time.  She has a history of gout, previously affecting her hand, and is currently on Aquacel for iron  supplementation, which she tolerates well despite mild constipation.  She engages in regular physical activity, including exercises with a five-pound weight.  She has a history of foot issues and previously received foot injections. She has not seen a foot doctor since last October  and mentions difficulty managing foot braces on her own. No current foot ulcers.   Diabetes She presents for her follow-up diabetic visit. She has type 2 diabetes mellitus. Her disease course has been improving. There are no hypoglycemic associated symptoms. Pertinent negatives for diabetes include no blurred vision, no chest pain, no polydipsia, no polyphagia and no polyuria. There are no hypoglycemic complications. Diabetic complications include nephropathy. Risk factors for coronary artery disease include diabetes mellitus, dyslipidemia, hypertension, obesity, post-menopausal and sedentary lifestyle. She is compliant with treatment some of the time. Her weight is fluctuating minimally. She is following a generally healthy diet. She never participates in exercise. Her home blood glucose trend is decreasing steadily. Her breakfast blood glucose is taken between 8-9 am. Her breakfast blood glucose range is generally 130-140 mg/dl. An ACE inhibitor/angiotensin II receptor blocker is being taken. Eye exam is not current.  Hypertension This is a chronic problem. The current episode started more than 1 year ago. The problem has been gradually improving since onset. The problem is controlled. Pertinent negatives include no blurred vision, chest pain or palpitations. The current treatment provides moderate improvement. Hypertensive end-organ damage includes kidney disease. Identifiable causes of hypertension include sleep apnea.     Past Medical History:  Diagnosis Date   Abnormal liver function     in the past.   Anemia    Arthritis    all over   Bruises easily    Cataract  right eye;immature   CHF (congestive heart failure) (HCC)    Chronic back pain    stenosis   Chronic cough    Chronic kidney disease    COPD (chronic obstructive pulmonary disease) (HCC)    COVID    aug/sept 2021   Demand myocardial infarction [facility]) 2012   Demand Infarction in setting of Pancreatitis --> mild Troponin  elevation, NON-OBSTRUCTIVE CAD   Depression    takes Abilify  daily as well as Zoloft    Diastolic heart failure 2010   Grade 1 diastolic Dysfunction by Echo    Diverticulosis    DM (diabetes mellitus) (HCC)    takes Victoza  daily as well as Lantus  and Humalog    Empty sella    on MRI in 2009.   Glaucoma    Headache(784.0)    last migraine-4-7yrs ago   History of blood transfusion    no abnormal reaction noted   History of colon polyps    benign   HTN (hypertension)    takes Benicar ,Imdur ,and Bystolic  daily   Hyperlipidemia    takes Lipitor daily   Joint swelling    Nocturia    Obesity hypoventilation syndrome (HCC)    Obstructive sleep apnea 02/2018   Notably improved split-night study with weight loss from 270 (2017) down to 250 pounds (2019).   Pancreatitis    takes Pancrelipase  daily   Parkinson's disease (HCC)    takes Sinemet  daily   Peripheral neuropathy    Pneumonia 2012   Seizures (HCC)    takes Lamictal  daily and Primidone  nightly;last seizure 2wks ago   Urinary urgency    With increased frequency   Varicose veins of both lower extremities with pain    With edema.  Takes daily Lasix      Family History  Problem Relation Age of Onset   Allergies Father    Heart disease Father        before 18   Heart failure Father    Hypertension Father    Hyperlipidemia Father    Heart disease Mother    Diabetes Mother    Hypertension Mother    Hyperlipidemia Mother    Hypertension Sister    Heart disease Sister        before 59   Hyperlipidemia Sister    Diabetes Brother    Hypertension Brother    Diabetes Sister    Hypertension Sister    Diabetes Son    Hypertension Son    Cancer Other      Current Outpatient Medications:    acetaminophen  (TYLENOL ) 500 MG tablet, Take 500-1,000 mg by mouth every 4 (four) hours as needed for moderate pain or mild pain., Disp: , Rfl:    albuterol  (VENTOLIN  HFA) 108 (90 Base) MCG/ACT inhaler, Inhale 1-2 puffs into the lungs  every 6 (six) hours as needed for wheezing or shortness of breath., Disp: 17 each, Rfl: 5   Alcaftadine (LASTACAFT) 0.25 % SOLN, Place 1-2 drops into both eyes daily as needed (allergy eye/irritation.)., Disp: , Rfl:    Alcohol  Swabs (DROPSAFE ALCOHOL  PREP) 70 % PADS, USE TWO TIMES DAILY AS DIRECTED, Disp: 200 each, Rfl: 3   amiodarone  (PACERONE ) 100 MG tablet, TAKE 1 TABLET EVERY DAY, Disp: 90 tablet, Rfl: 3   apixaban  (ELIQUIS ) 5 MG TABS tablet, Take 1 tablet (5 mg total) by mouth 2 (two) times daily., Disp: 180 tablet, Rfl: 3   Ascorbic Acid (VITAMIN C) 1000 MG tablet, Take 1,000 mg by mouth in the morning., Disp: ,  Rfl:    atorvastatin  (LIPITOR) 40 MG tablet, TAKE 1 TABLET EVERY DAY, Disp: 90 tablet, Rfl: 3   B-D ULTRAFINE III SHORT PEN 31G X 8 MM MISC, USE DAILY WITH VICTOZA  AND/OR NOVOLOG ., Disp: 400 each, Rfl: 2   benzonatate  (TESSALON ) 100 MG capsule, Take 1 capsule by mouth three times daily as needed for cough, Disp: 30 capsule, Rfl: 0   Blood Glucose Monitoring Suppl (TRUE METRIX AIR GLUCOSE METER) w/Device KIT, USE AS DIRECTED, Disp: 1 kit, Rfl: 3   carvedilol  (COREG ) 12.5 MG tablet, TAKE 1 TABLET TWICE DAILY, Disp: 180 tablet, Rfl: 3   Dasiglucagon  HCl (ZEGALOGUE ) 0.6 MG/0.6ML SOAJ, Inject 0.6 mg into the skin daily as needed (use for extremely low blood sugars, less than 60)., Disp: 0.6 mL, Rfl: 3   diclofenac  sodium (VOLTAREN ) 1 % GEL, Apply 2 g topically 4 (four) times daily as needed (pain)., Disp: , Rfl:    ferrous sulfate  324 MG TBEC, Take 324 mg by mouth daily., Disp: , Rfl:    furosemide  (LASIX ) 20 MG tablet, Take 4 tablets (80 mg total) by mouth twice a day., Disp: 720 tablet, Rfl: 3   HUMALOG  KWIKPEN 100 UNIT/ML KwikPen, USE AS DIRECTED BY YOUR PRESCRIBER. COMPLETE DIRECTIONS ARE INCLUDED IN A LETTER WITH YOUR ORIGINAL ORDER, Disp: 45 mL, Rfl: 3   hydrALAZINE  (APRESOLINE ) 100 MG tablet, TAKE 1 TABLET THREE TIMES DAILY (DOSE CHANGE, REPLACES PREVIOUS PRESCRIPTIONS), Disp: 270  tablet, Rfl: 3   hydrocortisone cream 1 %, Apply 1 application topically daily as needed for itching. , Disp: , Rfl:    Insulin  Glargine (TOUJEO  SOLOSTAR [place]), Inject 20 Units into the skin at bedtime., Disp: , Rfl:    Isopropyl Alcohol  (ALCOHOL  WIPES) 70 % MISC, Apply 1 each topically 2 (two) times daily., Disp: 300 each, Rfl: 3   isosorbide  mononitrate (IMDUR ) 30 MG 24 hr tablet, TAKE 1 TABLET EVERY DAY, Disp: 30 tablet, Rfl: 0   lamoTRIgine  (LAMICTAL ) 200 MG tablet, Take 1 tablet (200 mg total) by mouth 2 (two) times daily., Disp: 180 tablet, Rfl: 3   levETIRAcetam  (KEPPRA ) 500 MG tablet, Take 2 tablets every night, Disp: 180 tablet, Rfl: 3   loratadine  (CLARITIN ) 10 MG tablet, Take 1 tablet (10 mg total) by mouth daily., Disp: 30 tablet, Rfl: 2   Multiple Vitamin (MULTIVITAMIN) tablet, Take 1 tablet by mouth daily., Disp: , Rfl:    oxyCODONE  (OXY IR/ROXICODONE ) 5 MG immediate release tablet, Take 5 mg by mouth every 6 (six) hours as needed (pain.)., Disp: , Rfl:    OZEMPIC , 1 MG/DOSE, 4 MG/3ML SOPN, INJECT 1MG  UNDER THE SKIN ONE TIME WEEKLY ON WEDNESDAYS, Disp: 9 mL, Rfl: 3   Pancrelipase , Lip-Prot-Amyl, 25000-79000 units CPEP, Take 2 capsules by mouth in the morning, at noon, and at bedtime., Disp: , Rfl:    potassium chloride  SA (KLOR-CON  M) 20 MEQ tablet, Take 3 tablets (60 mEq total) by mouth daily., Disp: 300 tablet, Rfl: 3   sennosides-docusate sodium  (SENOKOT-S) 8.6-50 MG tablet, Take 1-2 tablets by mouth at bedtime as needed for constipation., Disp: , Rfl:    sertraline  (ZOLOFT ) 50 MG tablet, TAKE 1 TABLET EVERY DAY, Disp: 90 tablet, Rfl: 3   tiZANidine  (ZANAFLEX ) 2 MG tablet, TAKE 1 TABLET AT BEDTIME AS NEEDED, Disp: 30 tablet, Rfl: 11   tolnaftate (TINACTIN) 1 % cream, Apply 1 application topically daily as needed (foot fungus). , Disp: , Rfl:    TRUE METRIX BLOOD GLUCOSE TEST test strip, USE AS DIRECTED TO  CHECK BLOOD SUGARS 2 TIMES PER DAY, Disp: 200 strip, Rfl: 3   TRUEplus Lancets  33G MISC, TEST BLOOD SUGAR TWICE DAILY AS DIRECTED, Disp: 200 each, Rfl: 3 No current facility-administered medications for this visit.  Facility-Administered Medications Ordered in Other Visits:    0.9 %  sodium chloride  infusion (Manually program via Guardrails IV Fluids), , Intravenous, Once, [place], Jessica M, FNP   0.9 %  sodium chloride  infusion (Manually program via Guardrails IV Fluids), , Intravenous, Once, Rea, Jessica M, FNP   Allergies  Allergen Reactions   Other Anaphylaxis and Rash    Bleach Cigarette smoke/ Cough   Penicillins Hives    Did it involve swelling of the face/tongue/throat, SOB, or low BP? No Did it involve sudden or severe rash/hives, skin peeling, or any reaction on the inside of your mouth or nose? Yes Did you need to seek medical attention at a hospital or doctor's office? Yes When did it last happen?      Childhood allergy  If all above answers are "NO", may proceed with cephalosporin use.     Sulfa Antibiotics Hives   Aspirin  Other (See Comments)     On aspirin  81 mg - Rectal bleeding in dec 2018   Codeine     Headache and makes the patient feel off     Review of Systems  Constitutional: Negative.   Eyes:  Negative for blurred vision.  Respiratory: Negative.    Cardiovascular: Negative.  Negative for chest pain and palpitations.  Gastrointestinal: Negative.   Endocrine: Negative for polydipsia, polyphagia and polyuria.  Neurological: Negative.   Psychiatric/Behavioral: Negative.       Today's Vitals   04/29/24 1126  BP: 120/70  Pulse: 69  Temp: 98.4 F (36.9 C)  SpO2: 98%  Weight: 175 lb (79.4 kg)  Height: 5' 1 (1.549 m)   Body mass index is 33.07 kg/m.  Wt Readings from Last 3 Encounters:  04/29/24 175 lb (79.4 kg)  03/26/24 176 lb 6.4 oz (80 kg)  02/26/24 176 lb 14.4 oz (80.2 kg)     Objective:  Physical Exam Vitals and nursing note reviewed.  Constitutional:      Appearance: Normal appearance. She is obese.   HENT:     Head: Normocephalic and atraumatic.  Eyes:     Extraocular Movements: Extraocular movements intact.  Cardiovascular:     Rate and Rhythm: Normal rate and regular rhythm.     Heart sounds: Normal heart sounds.  Pulmonary:     Effort: Pulmonary effort is normal.     Breath sounds: Normal breath sounds.  Musculoskeletal:     Cervical back: Normal range of motion.     Comments: Ambulatory with cane  Skin:    General: Skin is warm.  Neurological:     General: No focal deficit present.     Mental Status: She is alert.  Psychiatric:        Mood and Affect: Mood normal.        Behavior: Behavior normal.      Assessment And Plan:  Type 2 diabetes mellitus with stage 3a chronic kidney disease, with long-term current use of insulin  (HCC) Assessment & Plan: Chronic, blood glucose generally controlled with occasional hyperglycemia and hypoglycemia. A1c previously 7.3%. - Ordered A1c test. - Continue current diabetes management regimen. - She will continue with Toujeo  20 units sq at bedtime and Ozempic  1mg  weekly.   Orders: -     Ambulatory referral to Podiatry -  Hemoglobin A1c  Hypertensive heart and renal disease with renal failure, stage 1 through stage 4 or unspecified chronic kidney disease, with heart failure (HCC) Assessment & Plan: Chronic, well controlled.  Blood pressure managed with carvedilol  and hydralazine . Lasix  for diuretic effect. Potassium supplementation due to Lasix . - Continue carvedilol  twice daily. - Continue hydralazine  three times daily. - Continue Lasix  80 mg daily. - Continue potassium supplementation. - Follow low sodium diet.    Chronic diastolic CHF (congestive heart failure), NYHA class 2 (HCC) Assessment & Plan: Chronic, appears stable. Last echo was in Importance of dietary/medication compliance was discussed with the patient. She is encouraged to gradually increase her daily activity.    Seizure disorder [facility]) Assessment &  Plan: Seizure disorder managed with Lamictal  and Keppra . No recent Todd's paralysis. Neurology follow-up scheduled. - Continue Lamictal  200 mg twice daily. - Continue Keppra  500 mg twice daily. - Follow up with neurology in January.   Obesity hypoventilation syndrome (HCC) Assessment & Plan: Chronic, this has continued to improve with weight loss.    History of gout Assessment & Plan: History of gout with previous hand involvement. Uric acid level needs checking. - Ordered uric acid level test.  Orders: -     Uric acid  Other iron  deficiency anemia Assessment & Plan: On Aquacel for iron  supplementation. Mild constipation reported. Hematology follow-up scheduled. - Continue Aquacel for iron  supplementation. - Follow up with hematology for blood work in December.  Orders: -     CBC  Immunization due -     Tdap vaccine greater than or equal to 7yo IM   Assessment & Plan General Health Maintenance Immunizations up to date except COVID booster and tetanus. - Advised to obtain COVID booster at pharmacy.  Return if symptoms worsen or fail to improve.  Patient was given opportunity to ask questions. Patient verbalized understanding of the plan and was able to repeat key elements of the plan. All questions were answered to their satisfaction.   I, Catheryn LOISE Slocumb, MD, have reviewed all documentation for this visit. The documentation on 04/29/24 for the exam, diagnosis, procedures, and orders are all accurate and complete.   IF YOU HAVE BEEN REFERRED TO A SPECIALIST, IT MAY TAKE 1-2 WEEKS TO SCHEDULE/PROCESS THE REFERRAL. IF YOU HAVE NOT HEARD FROM US /SPECIALIST IN TWO WEEKS, PLEASE GIVE US  A CALL AT [phone].   THE PATIENT IS ENCOURAGED TO PRACTICE SOCIAL DISTANCING DUE TO THE COVID-19 PANDEMIC.

## 2024-04-30 ENCOUNTER — Ambulatory Visit (INDEPENDENT_AMBULATORY_CARE_PROVIDER_SITE_OTHER): Payer: Self-pay | Admitting: Licensed Clinical Social Worker

## 2024-04-30 DIAGNOSIS — F411 Generalized anxiety disorder: Secondary | ICD-10-CM

## 2024-04-30 DIAGNOSIS — F331 Major depressive disorder, recurrent, moderate: Secondary | ICD-10-CM

## 2024-04-30 LAB — URIC ACID: Uric Acid: 6.2 mg/dL (ref 3.1–7.9)

## 2024-04-30 LAB — CBC
Hematocrit: 29.1 % — ABNORMAL LOW (ref 34.0–46.6)
Hemoglobin: 8.4 g/dL — ABNORMAL LOW (ref 11.1–15.9)
MCH: 21.7 pg — ABNORMAL LOW (ref 26.6–33.0)
MCHC: 28.9 g/dL — ABNORMAL LOW (ref 31.5–35.7)
MCV: 75 fL — ABNORMAL LOW (ref 79–97)
Platelets: 318 x10E3/uL (ref 150–450)
RBC: 3.87 x10E6/uL (ref 3.77–5.28)
RDW: 17.6 % — ABNORMAL HIGH (ref 11.7–15.4)
WBC: 4 x10E3/uL (ref 3.4–10.8)

## 2024-04-30 LAB — HEMOGLOBIN A1C
Est. average glucose Bld gHb Est-mCnc: 134 mg/dL
Hgb A1c MFr Bld: 6.3 % — ABNORMAL HIGH (ref 4.8–5.6)

## 2024-04-30 NOTE — BH Specialist Note (Addendum)
 Collaborative Care Initial Assessment   Pt name: [name] MRN# [id number]   Date: 04/30/24   Session Start time 1100 Session End time: 1140 Total time in minutes: 40  Encounter Diagnoses  Name Primary?   MDD (major depressive disorder), recurrent episode, moderate (HCC) Yes   Generalized anxiety disorder     Type of Contact:  in person   Patient consent obtained:  Yes  Patient and/or legal guardian verbally consented to [facility] services about presenting concerns and psychiatric consultation as appropriate.  The services will be billed as appropriate for the patient   Types of Service: Comprehensive Clinical Assessment (CCA) and Collaborative care  Summary  [name] is a 78 y.o. female with history of depression seen in consultation at the request of Catheryn Slocumb MD for establishment of [facility] management.   Pt is currently taking the following psychiatric medications: sertraline  50 mg. Pt also takes keppra  500 mg x 2 and lamictal  200 mg for seizure disorder, managed by her neurologist .  Current symptoms include: feeling down/depressed, low energy, insomnia,worrying, feeling nervous/anxious/on edge, .  Pt denies SI, HI, or AVH at time of session. Pt denies substance use.     Reason for referral in patient/family's own words:  Need some help managing my current situation  Patient's goal for today's visit: Establish [facility] Collaborative Care  History of Present illness:    History of present illness:  [name] reports that they have a history of depression and anxiety for many years and have had the following treatments: sertraline  50 mg.  Pt reports concerns about medical history including seizure disorder, diabetes, COPD, OSA, GI issues, edema, chronic pain, congestive heart failure, hypertension, atrial fibrillation.  Pt reports that current external stressors include residing with her sister and sister's family  members.  Pt feels that symptoms of stress, depression, and anxiety are impacting everyday functioning including sleep quality/quantity, social interaction, ability to engage in recreational activities, impacting appetite, and ability to engage with tasks both inside and outside of the home. [name] reports that her son Lonni is primary support system at time of assessment.   Pt feels IBH counseling supports would be something to assist in their overall symptom management.   Clinical Assessments (PHQ-9 and GAD-7)  PHQ-9 Assessments:     04/30/2024   11:56 AM 03/26/2024    3:10 PM 01/14/2024   10:11 AM  Depression screen PHQ 2/9  Decreased Interest 1 3 0  Down, Depressed, Hopeless 2 2 0  PHQ - 2 Score 3 5 0  Altered sleeping 2 1 0  Tired, decreased energy 2 2 0  Change in appetite 2 2 0  Feeling bad or failure about yourself  2 1 0  Trouble concentrating 0 1 0  Moving slowly or fidgety/restless 2 1 0  Suicidal thoughts 1 0 0  PHQ-9 Score 14 13  0   Difficult doing work/chores Somewhat difficult Somewhat difficult Not difficult at all     Data saved with a previous flowsheet row definition     GAD-7 Assessments:     04/30/2024   11:57 AM 08/29/2023   11:19 AM  GAD 7 : Generalized Anxiety Score  Nervous, Anxious, on Edge 1 0  Control/stop worrying 1 0  Worry too much - different things 2 0  Trouble relaxing 2 0  Restless 2 0  Easily annoyed or irritable 1 0  Afraid - awful might happen 2 0  Total GAD 7 Score 11 0  Anxiety Difficulty Somewhat difficult Not difficult at all      Social History:  Household:  BIL, sister,  Marital status:  widow Number of Children:  children, 10 grandchildren Employment:  retired Programme Researcher, Broadcasting/film/video:  high school  Psychiatric Review of systems: Insomnia: most days Changes in appetite: decreased  Decreased need for sleep: No--pt reports she feels tired if she does not get restful sleep Family history of bipolar disorder:  No Hallucinations: No   Paranoia: No    Psychotropic medications: sertraline  50 x 1. Pt also takes keppra  500 x 2 and lamictal200 x 2 for seizure disorder.   Current medications: Current Outpatient Medications on File Prior to Visit  Medication Sig Dispense Refill   acetaminophen  (TYLENOL ) 500 MG tablet Take 500-1,000 mg by mouth every 4 (four) hours as needed for moderate pain or mild pain.     albuterol  (VENTOLIN  HFA) 108 (90 Base) MCG/ACT inhaler Inhale 1-2 puffs into the lungs every 6 (six) hours as needed for wheezing or shortness of breath. 17 each 5   Alcaftadine (LASTACAFT) 0.25 % SOLN Place 1-2 drops into both eyes daily as needed (allergy eye/irritation.).     Alcohol  Swabs (DROPSAFE ALCOHOL  PREP) 70 % PADS USE TWO TIMES DAILY AS DIRECTED 200 each 3   amiodarone  (PACERONE ) 100 MG tablet TAKE 1 TABLET EVERY DAY 90 tablet 3   apixaban  (ELIQUIS ) 5 MG TABS tablet Take 1 tablet (5 mg total) by mouth 2 (two) times daily. 180 tablet 3   Ascorbic Acid (VITAMIN C) 1000 MG tablet Take 1,000 mg by mouth in the morning.     atorvastatin  (LIPITOR) 40 MG tablet TAKE 1 TABLET EVERY DAY 90 tablet 3   B-D ULTRAFINE III SHORT PEN 31G X 8 MM MISC USE DAILY WITH VICTOZA  AND/OR NOVOLOG . 400 each 2   benzonatate  (TESSALON ) 100 MG capsule Take 1 capsule by mouth three times daily as needed for cough 30 capsule 0   Blood Glucose Monitoring Suppl (TRUE METRIX AIR GLUCOSE METER) w/Device KIT USE AS DIRECTED 1 kit 3   carvedilol  (COREG ) 12.5 MG tablet TAKE 1 TABLET TWICE DAILY 180 tablet 3   Dasiglucagon  HCl (ZEGALOGUE ) 0.6 MG/0.6ML SOAJ Inject 0.6 mg into the skin daily as needed (use for extremely low blood sugars, less than 60). 0.6 mL 3   diclofenac  sodium (VOLTAREN ) 1 % GEL Apply 2 g topically 4 (four) times daily as needed (pain).     ferrous sulfate  324 MG TBEC Take 324 mg by mouth daily.     furosemide  (LASIX ) 20 MG tablet Take 4 tablets (80 mg total) by mouth twice a day. 720 tablet 3   HUMALOG   KWIKPEN 100 UNIT/ML KwikPen USE AS DIRECTED BY YOUR PRESCRIBER. COMPLETE DIRECTIONS ARE INCLUDED IN A LETTER WITH YOUR ORIGINAL ORDER 45 mL 3   hydrALAZINE  (APRESOLINE ) 100 MG tablet TAKE 1 TABLET THREE TIMES DAILY (DOSE CHANGE, REPLACES PREVIOUS PRESCRIPTIONS) 270 tablet 3   hydrocortisone cream 1 % Apply 1 application topically daily as needed for itching.      Insulin  Glargine (TOUJEO  SOLOSTAR [place]) Inject 20 Units into the skin at bedtime.     Isopropyl Alcohol  (ALCOHOL  WIPES) 70 % MISC Apply 1 each topically 2 (two) times daily. 300 each 3   isosorbide  mononitrate (IMDUR ) 30 MG 24 hr tablet TAKE 1 TABLET EVERY DAY 30 tablet 0   lamoTRIgine  (LAMICTAL ) 200 MG tablet Take 1 tablet (200 mg total) by mouth 2 (two) times daily.  180 tablet 3   levETIRAcetam  (KEPPRA ) 500 MG tablet Take 2 tablets every night 180 tablet 3   loratadine  (CLARITIN ) 10 MG tablet Take 1 tablet (10 mg total) by mouth daily. 30 tablet 2   Multiple Vitamin (MULTIVITAMIN) tablet Take 1 tablet by mouth daily.     oxyCODONE  (OXY IR/ROXICODONE ) 5 MG immediate release tablet Take 5 mg by mouth every 6 (six) hours as needed (pain.).     OZEMPIC , 1 MG/DOSE, 4 MG/3ML SOPN INJECT 1MG  UNDER THE SKIN ONE TIME WEEKLY ON WEDNESDAYS 9 mL 3   Pancrelipase , Lip-Prot-Amyl, 25000-79000 units CPEP Take 2 capsules by mouth in the morning, at noon, and at bedtime.     potassium chloride  SA (KLOR-CON  M) 20 MEQ tablet Take 3 tablets (60 mEq total) by mouth daily. 300 tablet 3   sennosides-docusate sodium  (SENOKOT-S) 8.6-50 MG tablet Take 1-2 tablets by mouth at bedtime as needed for constipation.     sertraline  (ZOLOFT ) 50 MG tablet TAKE 1 TABLET EVERY DAY 90 tablet 3   tiZANidine  (ZANAFLEX ) 2 MG tablet TAKE 1 TABLET AT BEDTIME AS NEEDED 30 tablet 11   tolnaftate (TINACTIN) 1 % cream Apply 1 application topically daily as needed (foot fungus).      TRUE METRIX BLOOD GLUCOSE TEST test strip USE AS DIRECTED TO CHECK BLOOD SUGARS 2 TIMES PER DAY 200 strip  3   TRUEplus Lancets 33G MISC TEST BLOOD SUGAR TWICE DAILY AS DIRECTED 200 each 3   Current Facility-Administered Medications on File Prior to Visit  Medication Dose Route Frequency Provider Last Rate Last Admin   0.9 %  sodium chloride  infusion (Manually program via Guardrails IV Fluids)   Intravenous Once [place], Harlene HERO, FNP       0.9 %  sodium chloride  infusion (Manually program via Guardrails IV Fluids)   Intravenous Once [place], Harlene HERO, FNP         Patient taking medications as prescribed:  Yes Side effects reported: No   Psychiatric History  Have you ever been treated for a mental health problem? Yes--medication management If Yes, when were you treated and whom did you see (psychiatrist/counselor) ?     When 2013 (sertraline )       Name of provider PCP manages medication     Psychiatric History  Depression: Yes Anxiety: Yes Mania: No Psychosis: No PTSD symptoms: No  Past Psychiatric History/Hospitalization(s): Hospitalization for psychiatric illness: No Prior Suicide Attempts: No Prior Self-injurious behavior: No  Have you ever had thoughts of harming yourself or others or attempted suicide? No plan to harm self or others  Traumatic Experiences: History or current traumatic events (natural disaster, house fire, etc.)? no History or current physical trauma?  no History or current emotional trauma?  no History or current sexual trauma?  no History or current domestic or intimate partner violence?  no   Alcohol  and/or Substance Use History   Tobacco Alcohol  Other substances  Current use Pt denies (AUDIT-C screening) Pt denies Pt denies  Past use Pt denies Pt denies Pt denies  Past treatment Pt denies Pt denies Pt denies   Flowsheet Row Clinical Support from 12/04/2023 in [facility]  AUDIT-C Score 0     Withdrawal Potential: none  [place] Suicide Severity Rating Scale:  Flowsheet Row Integrated Behavioral Health  from 04/30/2024 in [facility] ED from 03/21/2024 in [facility] Emergency Department [facility] from 12/19/2023 in [facility]c - A Dept Of [place]  C-SSRS RISK CATEGORY Low Risk No Risk No Risk     Guns in the home (secured):  no   The patient demonstrates the following risk factors for suicide: Chronic risk factors for suicide include: psychiatric disorder of depression, anxiety. Acute risk factors for suicide include: family or marital conflict and social withdrawal/isolation. Protective factors for this patient include: responsibility to others (children, family). Considering these factors, the overall suicide risk at this point appears to be low. Patient is appropriate for outpatient follow up.  Danger to Others Risk Assessment Danger to others risk factors:  NONE Patient endorses recent thoughts of harming others:  Pt denies Dynamic Appraisal of Situational Aggression (DASA): NONE  BH Counselor discussed emergency crisis plan with client and provided local emergency services resources.  Mental status exam:   General Appearance Siegfried:  Neat Eye Contact:  Good Motor Behavior:  Restlestness and TD Speech:  Normal Level of Consciousness:  Alert Mood:  Anxious and Depressed Affect:  Depressed and Tearful Anxiety Level:  Minimal Thought Process:  Tangential and Intact Thought Content:  WNL Perception:  Normal Judgment:  Good Insight:  Present  Diagnosis: Encounter Diagnoses  Name Primary?   MDD (major depressive disorder), recurrent episode, moderate (HCC) Yes   Generalized anxiety disorder       Goals: Increase healthy adjustment to current life circumstances   Interventions: Medication Monitoring and Psychoeducation and/or Health Education   Follow-up Plan: [facility] Collaborative care management  Nyzaiah Kai R Semaj Coburn, LCSW  Assessment completed by Tawni Brisker, MSW, LCSW  on  04/30/24

## 2024-04-30 NOTE — Patient Instructions (Signed)
 Using Behavioral Activation to manage stress/depression symptoms    Identify/understand your own mood triggers.   Structure your day--get up around the same time, eat meals/snacks around the same time, go to bed around the same time.   Purposefully schedule self care time and time to complete tasks. This can include quiet time  Stimulate your brain--go for a walk, text/call a friend or family member, if you are indoors--go outside (and vice versa), go for a drive, go to a store with bright colors and bright lights. Try to do things in a different way--drive to your favorite places using an alternative route, or instead of starting on the right side of the grocery store when shopping, start on the left side. You might feel a bit uncomfortable doing things outside of the comfort zone, but this is helping the brain create new neural pathways and is very healthy for brain/emotional health.   Physical movement based on your ability. If you can go for a walk, do stretches, even waving your hands to music can trigger feel-good endorphins in the brain and help release physical tension we all hold in our bodies.  Even 5 minutes can make a difference.   Be intentional about doing things that bring you joy (or used to bring you joy), and look for the things in every day that make you happy.  Seek those glimmers of joy each day.  Set a timer for 5 minutes for a harder task (ex. Laundry, washing dishes).  Allow yourself to work distraction-free for 5 minutes, then stop when the timer goes off. If you need a break, take a break. If you want to continue working then set another timer for whatever time you choose.   Limit or eliminate substance use including alcohol, marijuana, or recreational use of prescription medication.  Let in the light!! Open the window blinds, curtains and let natural light in. Even sitting near a window or sitting outside can boost your mood, especially in the wintertime when there  is less daylight.    Things to envision for ourselves to to improve inspiration, motivation, and initiative :  improving physical wellness, focus on family relationships, focusing on our own mental/emotional well being, being a part of a bigger community, finding a hobby, being a part of something that fosters personal growth, engaging socially with others (even digitally!!!)    Emergency Resources:  National Suicide & Crisis Lifeline: Call or text 988  Crisis Text Line: Text HOME to 463-336-3219  [facility]  [address], [place], [place] [address] [phone] or [phone] WALK-IN URGENT CARE 24/7 FOR ANYONE [address] [place], [place]  [phone] Fax: [phone] [email or web address] *Interpreters available *Accepts all insurance and uninsured for Urgent Care needs *Accepts Medicaid and uninsured for outpatient treatment (below)

## 2024-05-03 ENCOUNTER — Ambulatory Visit: Payer: Self-pay | Admitting: Internal Medicine

## 2024-05-05 ENCOUNTER — Encounter: Payer: Self-pay | Admitting: Neurology

## 2024-05-05 DIAGNOSIS — F331 Major depressive disorder, recurrent, moderate: Secondary | ICD-10-CM | POA: Insufficient documentation

## 2024-05-05 NOTE — Assessment & Plan Note (Addendum)
 Chronic, blood glucose generally controlled with occasional hyperglycemia and hypoglycemia. A1c previously 7.3%. - Ordered A1c test. - Continue current diabetes management regimen. - She will continue with Toujeo  20 units sq at bedtime and Ozempic  1mg  weekly.

## 2024-05-05 NOTE — Assessment & Plan Note (Signed)
 Chronic, this has continued to improve with weight loss.

## 2024-05-11 NOTE — Assessment & Plan Note (Signed)
 Chronic, appears stable. Last echo was in Importance of dietary/medication compliance was discussed with the patient. She is encouraged to gradually increase her daily activity.

## 2024-05-12 ENCOUNTER — Telehealth (INDEPENDENT_AMBULATORY_CARE_PROVIDER_SITE_OTHER): Payer: Self-pay | Admitting: Licensed Clinical Social Worker

## 2024-05-12 DIAGNOSIS — M170 Bilateral primary osteoarthritis of knee: Secondary | ICD-10-CM | POA: Diagnosis not present

## 2024-05-12 DIAGNOSIS — I5032 Chronic diastolic (congestive) heart failure: Secondary | ICD-10-CM | POA: Diagnosis not present

## 2024-05-12 DIAGNOSIS — F331 Major depressive disorder, recurrent, moderate: Secondary | ICD-10-CM

## 2024-05-12 DIAGNOSIS — M160 Bilateral primary osteoarthritis of hip: Secondary | ICD-10-CM | POA: Diagnosis not present

## 2024-05-12 DIAGNOSIS — E1122 Type 2 diabetes mellitus with diabetic chronic kidney disease: Secondary | ICD-10-CM | POA: Diagnosis not present

## 2024-05-12 DIAGNOSIS — I130 Hypertensive heart and chronic kidney disease with heart failure and stage 1 through stage 4 chronic kidney disease, or unspecified chronic kidney disease: Secondary | ICD-10-CM | POA: Diagnosis not present

## 2024-05-12 DIAGNOSIS — N182 Chronic kidney disease, stage 2 (mild): Secondary | ICD-10-CM | POA: Diagnosis not present

## 2024-05-12 DIAGNOSIS — M19071 Primary osteoarthritis, right ankle and foot: Secondary | ICD-10-CM | POA: Diagnosis not present

## 2024-05-12 DIAGNOSIS — M19072 Primary osteoarthritis, left ankle and foot: Secondary | ICD-10-CM | POA: Diagnosis not present

## 2024-05-12 NOTE — BH Specialist Note (Unsigned)
 Virtual Behavioral Health Treatment Plan Team Note  MRN: [id number] NAME: [name]  DATE: 05/12/24  Start time:   End time:   Total time:    Total number of Virtual BH Treatment Team Plan encounters: 1/4  Treatment Team Attendees: Tawni Brisker, LCSW and Sharlot Becker, DNP   Diagnoses: No diagnosis found.  Goals, Interventions and Follow-up Plan Goals: Increase healthy adjustment to current life circumstances Interventions: Medication Monitoring Psychoeducation and/or Health Education  Medication Management Recommendations: ***  Follow-up Plan: IBH Collaborative care management  History of the present illness Presenting Problem/Current Symptoms: [name] is a 78 y.o. female with history of depression seen in consultation at the request of Catheryn Slocumb MD for establishment of [facility] management.   Pt is currently taking the following psychiatric medications: sertraline  50 mg. Pt also takes keppra  500 mg x 2 and lamictal  200 mg for seizure disorder, managed by her neurologist .  Current symptoms include: feeling down/depressed, low energy, insomnia,worrying, feeling nervous/anxious/on edge, .  Pt denies SI, HI, or AVH at time of session. Pt denies substance use.   Psychiatric History   Have you ever been treated for a mental health problem? Yes--medication management If Yes, when were you treated and whom did you see (psychiatrist/counselor) ?     When 2013 (sertraline )       Name of provider PCP manages medication     Psychiatric History  Depression: Yes Anxiety: Yes Mania: No Psychosis: No PTSD symptoms: No   Past Psychiatric History/Hospitalization(s): Hospitalization for psychiatric illness: No Prior Suicide Attempts: No Prior Self-injurious behavior: No   Have you ever had thoughts of harming yourself or others or attempted suicide? No plan to harm self or others  Psychosocial stressors Flowsheet Row Integrated Behavioral Health  from 04/30/2024 in [facility]  Current Stressors Family conflict  Familial Stressors None  Sleep Decreased, Difficulty falling asleep, Difficulty staying asleep, Frequent awakening  Appetite Decreased  Coping ability Overwhelmed  Patient taking medications as prescribed Yes    Self-harm Behaviors Risk Assessment Flowsheet Row Integrated Behavioral Health from 04/30/2024 in [facility]  Self-harm risk factors Family or marital conflict, Social withdrawal/isolation  Have you recently had any thoughts about harming yourself? No    Screenings PHQ-9 Assessments:     04/30/2024   11:56 AM 03/26/2024    3:10 PM 01/14/2024   10:11 AM  Depression screen PHQ 2/9  Decreased Interest 1 3 0  Down, Depressed, Hopeless 2 2 0  PHQ - 2 Score 3 5 0  Altered sleeping 2 1 0  Tired, decreased energy 2 2 0  Change in appetite 2 2 0  Feeling bad or failure about yourself  2 1 0  Trouble concentrating 0 1 0  Moving slowly or fidgety/restless 2 1 0  Suicidal thoughts 1 0 0  PHQ-9 Score 14 13  0   Difficult doing work/chores Somewhat difficult Somewhat difficult Not difficult at all     Data saved with a previous flowsheet row definition   GAD-7 Assessments:     04/30/2024   11:57 AM 08/29/2023   11:19 AM  GAD 7 : Generalized Anxiety Score  Nervous, Anxious, on Edge 1 0  Control/stop worrying 1 0  Worry too much - different things 2 0  Trouble relaxing 2 0  Restless 2 0  Easily annoyed or irritable 1 0  Afraid - awful might happen 2 0  Total GAD  7 Score 11 0  Anxiety Difficulty Somewhat difficult Not difficult at all    Past Medical History Past Medical History:  Diagnosis Date   Abnormal liver function     in the past.   Anemia    Arthritis    all over   Bruises easily    Cataract    right eye;immature   CHF (congestive heart failure) (HCC)    Chronic back pain    stenosis   Chronic cough    Chronic kidney  disease    COPD (chronic obstructive pulmonary disease) (HCC)    COVID    aug/sept 2021   Demand myocardial infarction [facility]) 2012   Demand Infarction in setting of Pancreatitis --> mild Troponin elevation, NON-OBSTRUCTIVE CAD   Depression    takes Abilify  daily as well as Zoloft    Diastolic heart failure 2010   Grade 1 diastolic Dysfunction by Echo    Diverticulosis    DM (diabetes mellitus) (HCC)    takes Victoza  daily as well as Lantus  and Humalog    Empty sella    on MRI in 2009.   Glaucoma    Headache(784.0)    last migraine-4-48yrs ago   History of blood transfusion    no abnormal reaction noted   History of colon polyps    benign   HTN (hypertension)    takes Benicar ,Imdur ,and Bystolic  daily   Hyperlipidemia    takes Lipitor daily   Joint swelling    Nocturia    Obesity hypoventilation syndrome (HCC)    Obstructive sleep apnea 02/2018   Notably improved split-night study with weight loss from 270 (2017) down to 250 pounds (2019).   Pancreatitis    takes Pancrelipase  daily   Parkinson's disease (HCC)    takes Sinemet  daily   Peripheral neuropathy    Pneumonia 2012   Seizures (HCC)    takes Lamictal  daily and Primidone  nightly;last seizure 2wks ago   Urinary urgency    With increased frequency   Varicose veins of both lower extremities with pain    With edema.  Takes daily Lasix     Vital signs: There were no vitals filed for this visit.  Allergies:  Allergies as of 05/12/2024 - Review Complete 04/29/2024  Allergen Reaction Noted   Other Anaphylaxis and Rash 05/18/2012   Penicillins Hives 10/26/2010   Sulfa antibiotics Hives 10/26/2010   Aspirin  Other (See Comments) 07/12/2017   Codeine  10/26/2010    Medication History Current medications:  Outpatient Encounter Medications as of 05/12/2024  Medication Sig   acetaminophen  (TYLENOL ) 500 MG tablet Take 500-1,000 mg by mouth every 4 (four) hours as needed for moderate pain or mild pain.   albuterol   (VENTOLIN  HFA) 108 (90 Base) MCG/ACT inhaler Inhale 1-2 puffs into the lungs every 6 (six) hours as needed for wheezing or shortness of breath.   Alcaftadine (LASTACAFT) 0.25 % SOLN Place 1-2 drops into both eyes daily as needed (allergy eye/irritation.).   Alcohol  Swabs (DROPSAFE ALCOHOL  PREP) 70 % PADS USE TWO TIMES DAILY AS DIRECTED   amiodarone  (PACERONE ) 100 MG tablet TAKE 1 TABLET EVERY DAY   apixaban  (ELIQUIS ) 5 MG TABS tablet Take 1 tablet (5 mg total) by mouth 2 (two) times daily.   Ascorbic Acid (VITAMIN C) 1000 MG tablet Take 1,000 mg by mouth in the morning.   atorvastatin  (LIPITOR) 40 MG tablet TAKE 1 TABLET EVERY DAY   B-D ULTRAFINE III SHORT PEN 31G X 8 MM MISC USE DAILY WITH VICTOZA  AND/OR NOVOLOG .  benzonatate  (TESSALON ) 100 MG capsule Take 1 capsule by mouth three times daily as needed for cough   Blood Glucose Monitoring Suppl (TRUE METRIX AIR GLUCOSE METER) w/Device KIT USE AS DIRECTED   carvedilol  (COREG ) 12.5 MG tablet TAKE 1 TABLET TWICE DAILY   Dasiglucagon  HCl (ZEGALOGUE ) 0.6 MG/0.6ML SOAJ Inject 0.6 mg into the skin daily as needed (use for extremely low blood sugars, less than 60).   diclofenac  sodium (VOLTAREN ) 1 % GEL Apply 2 g topically 4 (four) times daily as needed (pain).   ferrous sulfate  324 MG TBEC Take 324 mg by mouth daily.   furosemide  (LASIX ) 20 MG tablet Take 4 tablets (80 mg total) by mouth twice a day.   HUMALOG  KWIKPEN 100 UNIT/ML KwikPen USE AS DIRECTED BY YOUR PRESCRIBER. COMPLETE DIRECTIONS ARE INCLUDED IN A LETTER WITH YOUR ORIGINAL ORDER   hydrALAZINE  (APRESOLINE ) 100 MG tablet TAKE 1 TABLET THREE TIMES DAILY (DOSE CHANGE, REPLACES PREVIOUS PRESCRIPTIONS)   hydrocortisone cream 1 % Apply 1 application topically daily as needed for itching.    Insulin  Glargine (TOUJEO  SOLOSTAR Rolfe) Inject 20 Units into the skin at bedtime.   Isopropyl Alcohol  (ALCOHOL  WIPES) 70 % MISC Apply 1 each topically 2 (two) times daily.   isosorbide  mononitrate (IMDUR ) 30 MG  24 hr tablet TAKE 1 TABLET EVERY DAY   lamoTRIgine  (LAMICTAL ) 200 MG tablet Take 1 tablet (200 mg total) by mouth 2 (two) times daily.   levETIRAcetam  (KEPPRA ) 500 MG tablet Take 2 tablets every night   loratadine  (CLARITIN ) 10 MG tablet Take 1 tablet (10 mg total) by mouth daily.   Multiple Vitamin (MULTIVITAMIN) tablet Take 1 tablet by mouth daily.   oxyCODONE  (OXY IR/ROXICODONE ) 5 MG immediate release tablet Take 5 mg by mouth every 6 (six) hours as needed (pain.).   OZEMPIC , 1 MG/DOSE, 4 MG/3ML SOPN INJECT 1MG  UNDER THE SKIN ONE TIME WEEKLY ON WEDNESDAYS   Pancrelipase , Lip-Prot-Amyl, 25000-79000 units CPEP Take 2 capsules by mouth in the morning, at noon, and at bedtime.   potassium chloride  SA (KLOR-CON  M) 20 MEQ tablet Take 3 tablets (60 mEq total) by mouth daily.   sennosides-docusate sodium  (SENOKOT-S) 8.6-50 MG tablet Take 1-2 tablets by mouth at bedtime as needed for constipation.   sertraline  (ZOLOFT ) 50 MG tablet TAKE 1 TABLET EVERY DAY   tiZANidine  (ZANAFLEX ) 2 MG tablet TAKE 1 TABLET AT BEDTIME AS NEEDED   tolnaftate (TINACTIN) 1 % cream Apply 1 application topically daily as needed (foot fungus).    TRUE METRIX BLOOD GLUCOSE TEST test strip USE AS DIRECTED TO CHECK BLOOD SUGARS 2 TIMES PER DAY   TRUEplus Lancets 33G MISC TEST BLOOD SUGAR TWICE DAILY AS DIRECTED   Facility-Administered Encounter Medications as of 05/12/2024  Medication   0.9 %  sodium chloride  infusion (Manually program via Guardrails IV Fluids)   0.9 %  sodium chloride  infusion (Manually program via Guardrails IV Fluids)     Scribe for Treatment Team: [employer], LCSW

## 2024-05-18 ENCOUNTER — Ambulatory Visit

## 2024-05-18 ENCOUNTER — Encounter: Payer: Self-pay | Admitting: Podiatry

## 2024-05-18 ENCOUNTER — Ambulatory Visit: Admitting: Podiatry

## 2024-05-18 VITALS — Ht 61.0 in | Wt 175.0 lb

## 2024-05-18 DIAGNOSIS — M7751 Other enthesopathy of right foot: Secondary | ICD-10-CM

## 2024-05-18 DIAGNOSIS — M2141 Flat foot [pes planus] (acquired), right foot: Secondary | ICD-10-CM | POA: Diagnosis not present

## 2024-05-18 DIAGNOSIS — M19079 Primary osteoarthritis, unspecified ankle and foot: Secondary | ICD-10-CM

## 2024-05-18 DIAGNOSIS — M7752 Other enthesopathy of left foot: Secondary | ICD-10-CM

## 2024-05-18 DIAGNOSIS — M2142 Flat foot [pes planus] (acquired), left foot: Secondary | ICD-10-CM | POA: Diagnosis not present

## 2024-05-18 NOTE — Progress Notes (Unsigned)
 Chief Complaint  Patient presents with   Foot Pain    Pt is here due to bilateral foot and ankle pain, states she has problem since a child, she has flat feet and her ankle rolls in.    HPI: 78 y.o. female PMHx diabetes mellitus presenting today for evaluation of chronic bilateral foot pain and flatfoot deformity.  Past Medical History:  Diagnosis Date   Abnormal liver function     in the past.   Anemia    Arthritis    all over   Bruises easily    Cataract    right eye;immature   CHF (congestive heart failure) (HCC)    Chronic back pain    stenosis   Chronic cough    Chronic kidney disease    COPD (chronic obstructive pulmonary disease) (HCC)    COVID    aug/sept 2021   Demand myocardial infarction [facility]) 2012   Demand Infarction in setting of Pancreatitis --> mild Troponin elevation, NON-OBSTRUCTIVE CAD   Depression    takes Abilify  daily as well as Zoloft    Diastolic heart failure 2010   Grade 1 diastolic Dysfunction by Echo    Diverticulosis    DM (diabetes mellitus) (HCC)    takes Victoza  daily as well as Lantus  and Humalog    Empty sella    on MRI in 2009.   Glaucoma    Headache(784.0)    last migraine-4-24yrs ago   History of blood transfusion    no abnormal reaction noted   History of colon polyps    benign   HTN (hypertension)    takes Benicar ,Imdur ,and Bystolic  daily   Hyperlipidemia    takes Lipitor daily   Joint swelling    Nocturia    Obesity hypoventilation syndrome (HCC)    Obstructive sleep apnea 02/2018   Notably improved split-night study with weight loss from 270 (2017) down to 250 pounds (2019).   Pancreatitis    takes Pancrelipase  daily   Parkinson's disease (HCC)    takes Sinemet  daily   Peripheral neuropathy    Pneumonia 2012   Seizures (HCC)    takes Lamictal  daily and Primidone  nightly;last seizure 2wks ago   Urinary urgency    With increased frequency   Varicose veins of both lower extremities with pain    With edema.  Takes  daily Lasix     Past Surgical History:  Procedure Laterality Date   ABDOMINAL HYSTERECTOMY     APPENDECTOMY     ATRIAL SEPTAL DEFECT(ASD) CLOSURE N/A 01/02/2023   Procedure: ATRIAL SEPTAL DEFECT(ASD) CLOSURE;  Surgeon: Wonda Sharper, MD;  Location: [facility] INVASIVE CV LAB;  Service: Cardiovascular;  Laterality: N/A;   BACK SURGERY     Cardiac Event Monitor  September-October 2017   Sinus rhythm with occasional PACs and artifact. No arrhythmias besides one short run of tachycardia.   CARDIOVERSION N/A 08/15/2022   Procedure: CARDIOVERSION;  Surgeon: Rolan Ezra RAMAN, MD;  Location: [facility] ENDOSCOPY;  Service: Cardiovascular;  Laterality: N/A;   CARDIOVERSION N/A 02/04/2023   Procedure: CARDIOVERSION;  Surgeon: Rolan Ezra RAMAN, MD;  Location: [facility] INVASIVE CV LAB;  Service: Cardiovascular;  Laterality: N/A;   CHOLECYSTECTOMY     COLONOSCOPY N/A 08/15/2012   Procedure: COLONOSCOPY;  Surgeon: Elsie Cree, MD;  Location: [facility] ENDOSCOPY;  Service: Endoscopy;  Laterality: N/A;   COLONOSCOPY WITH PROPOFOL  Left 06/17/2017   Procedure: COLONOSCOPY WITH PROPOFOL ;  Surgeon: Saintclair Jasper, MD;  Location: [facility] ENDOSCOPY;  Service: Gastroenterology;  Laterality: Left;   COLONOSCOPY WITH  PROPOFOL  N/A 04/03/2021   Procedure: COLONOSCOPY WITH PROPOFOL ;  Surgeon: Rosalie Kitchens, MD;  Location: [facility] ENDOSCOPY;  Service: Endoscopy;  Laterality: N/A;   CORONARY CALCIUM  SCORE AND CTA  04/2018   Coronary calcium  score 71.9.  Very large, hyperdynamic LAD wrapping apex giving rise to PDA.  Moderate LAD-diagonal and circumflex plaque -> CT FFR suggested positive findings in distal D1, D2 and distal circumflex.  Referred for cath. ==> FALSE POSITIVE   ESOPHAGOGASTRODUODENOSCOPY     eye cysts Bilateral    LASIK     LEFT HEART CATH AND CORONARY ANGIOGRAPHY N/A 05/16/2018   Procedure: LEFT HEART CATH AND CORONARY ANGIOGRAPHY;  Surgeon: Claudene Victory ORN, MD;  Location: [facility] INVASIVE CV LAB:  Angiographically normal coronary arteries with LVEDP  of 21 mmHg.  Large draping hyperdominant LAD that wraps the apex and provides distal half of the PDA.  Relatively small caliber distal Cx -->proxLPDA, non-dom RCA. No Cx or Diag lesions -- FALSE + CT FFR   LEFT HEART CATH AND CORONARY ANGIOGRAPHY  2009/March 2012   2009: (Dr. Hershel) Nonobstructive CAD; 2012: Minimal CAD --> false positive stress test   NM MYOVIEW  LTD  01/2016; 01/2018   a)LOW RISK. No ischemia or infarction;; b) EF 55-60%. NO ST changes.  No ischemia or Infarction. NO significant RV enlargement.  LOW RISK.   POLYPECTOMY  04/03/2021   Procedure: POLYPECTOMY;  Surgeon: Rosalie Kitchens, MD;  Location: [facility] ENDOSCOPY;  Service: Endoscopy;;   Polysomnogram  02/2018   (Dr. Chalice from neurology): Split study was not adequately done due to low AHI.  She slept reclined and had an AHI less than 10, prolonged hypoxemia and no hypercapnia. -->  She has home oxygen  already prescribed, but did not meet criteria for nightly oxygen .  (Was noted that the patient did not cooperate well with study.  Titration was discussed.   PRESSURE SENSOR/CARDIOMEMS N/A 11/09/2019   Procedure: PRESSURE SENSOR/CARDIOMEMS;  Surgeon: Rolan Ezra RAMAN, MD;  Location: [facility] INVASIVE CV LAB;  Service: Cardiovascular;  Laterality: N/A;   RECTAL POLYPECTOMY     RIGHT HEART CATH N/A 09/07/2022   Procedure: RIGHT HEART CATH;  Surgeon: Rolan Ezra RAMAN, MD;  Location: [facility] INVASIVE CV LAB;  Service: Cardiovascular;  Laterality: N/A;   RIGHT/LEFT HEART CATH AND CORONARY ANGIOGRAPHY N/A 04/06/2019   Procedure: RIGHT/LEFT HEART CATH AND CORONARY ANGIOGRAPHY;  Surgeon: Anner Alm ORN, MD;  Location: [facility] INVASIVE CV LAB; angiographically normal coronary arteries.  Moderately elevated AD LVEDP..  Mild pulmonary hypertension: PA P 56/14 mmHg-mean 31 mmHg.  RAP 10 mmHg.  RV P-EDP 54/4 mmHg - 12 mmHg.  PCWP 11-15 mmHg.  CO-CI: 7.35-3.73.   TEE WITHOUT CARDIOVERSION N/A 08/15/2022   Procedure: TRANSESOPHAGEAL ECHOCARDIOGRAM (TEE);  Surgeon:  Rolan Ezra RAMAN, MD;  Location: [facility] ENDOSCOPY;  Service: Cardiovascular;  Laterality: N/A;   TONSILLECTOMY     TRANSTHORACIC ECHOCARDIOGRAM  01/2016; 05/2017   A) Normal LV chamber size with mod LVH pattern. EF 50-55%. Severe LA dilation. Mod RA dilation. PAP elevated at 38 mmHg;; B) Mild concentric LVH.  EF 55-60% & no RWMA.  Grade 2 DD.  Diastolic flattening of the ventricular septum == ? elevated PAP.  Mild aortic valve calcification/sclerosis.  Mod LA dilation.  Mild RV dilation & Mod RA dilation   TRANSTHORACIC ECHOCARDIOGRAM  10/2017; 01/2018   a) EF 55-60%.  No RWMA,  Moderate LA dilation.  Mild LA dilation.  Peak PA pressures ~57 mmHg (moderate);;; b) EF 55-60%.  GRII DD.  Suggestion  of RV volume overload with moderate RV dilation.  Severe LA and RA dilation.SABRA  PA pressure ~67%.   TRANSTHORACIC ECHOCARDIOGRAM  01/2019   Normal LV size and function.  EF 50 to 55%.  Impaired relaxation.  RV appears to have moderately reduced function.  Severely enlarged.  Cannot fully assess RV pressures.  Hypermobile interatrial septum.  Right atrium severely dilated.   TUBAL LIGATION     vaginal cyst removed     several times    Allergies  Allergen Reactions   Other Anaphylaxis and Rash    Bleach Cigarette smoke/ Cough   Penicillins Hives    Did it involve swelling of the face/tongue/throat, SOB, or low BP? No Did it involve sudden or severe rash/hives, skin peeling, or any reaction on the inside of your mouth or nose? Yes Did you need to seek medical attention at a hospital or doctor's office? Yes When did it last happen?      Childhood allergy  If all above answers are "NO", may proceed with cephalosporin use.     Sulfa Antibiotics Hives   Aspirin  Other (See Comments)     On aspirin  81 mg - Rectal bleeding in dec 2018   Codeine     Headache and makes the patient feel off     Physical Exam: General: The patient is alert and oriented x3 in no acute distress.  Dermatology: Skin is warm,  dry and supple bilateral lower extremities.   Vascular: Palpable pedal pulses bilaterally. Capillary refill within normal limits.  Moderate edema noted bilateral lower extremities  Neurological: Light touch and protective threshold diminished  Musculoskeletal Exam: Pes planovalgus deformity noted with weightbearing  Radiographic Exam B/L foot and ankle 05/18/2024:  Advanced severe degenerative changes noted throughout the midtarsal joint with collapse of the medial longitudinal arch of the foot consistent with pes planovalgus deformity and advanced DJD  Assessment/Plan of Care: 1.  Pes planovalgus deformity with onset of arthritis  -Patient evaluated.  X-rays reviewed -Conservatively I do believe the patient would benefit from diabetic shoes with custom molded Plastizote insoles to accommodate the foot -Paperwork to [employer] [facility] prost[employer] for DM shoes -Refrain from going barefoot -Return to clinic PRN       Thresa EMERSON Sar, DPM [facility]  Dr. Thresa EMERSON Sar, DPM    [address] [place], [place] [address]                Office [phone]  Fax [phone]

## 2024-05-27 ENCOUNTER — Other Ambulatory Visit: Payer: Self-pay | Admitting: Internal Medicine

## 2024-05-28 ENCOUNTER — Other Ambulatory Visit (HOSPITAL_COMMUNITY): Payer: Self-pay | Admitting: Pharmacist

## 2024-05-28 ENCOUNTER — Inpatient Hospital Stay: Admitting: Internal Medicine

## 2024-05-28 ENCOUNTER — Inpatient Hospital Stay: Attending: Physician Assistant

## 2024-05-28 ENCOUNTER — Telehealth (HOSPITAL_COMMUNITY): Payer: Self-pay | Admitting: Pharmacy Technician

## 2024-05-28 ENCOUNTER — Ambulatory Visit: Admitting: Licensed Clinical Social Worker

## 2024-05-28 VITALS — BP 132/63 | HR 64 | Temp 98.2°F | Resp 17 | Ht 61.0 in | Wt 176.2 lb

## 2024-05-28 DIAGNOSIS — D500 Iron deficiency anemia secondary to blood loss (chronic): Secondary | ICD-10-CM

## 2024-05-28 DIAGNOSIS — D509 Iron deficiency anemia, unspecified: Secondary | ICD-10-CM | POA: Insufficient documentation

## 2024-05-28 DIAGNOSIS — Z79899 Other long term (current) drug therapy: Secondary | ICD-10-CM | POA: Diagnosis not present

## 2024-05-28 LAB — CBC WITH DIFFERENTIAL (CANCER CENTER ONLY)
Abs Immature Granulocytes: 0.01 K/uL (ref 0.00–0.07)
Basophils Absolute: 0 K/uL (ref 0.0–0.1)
Basophils Relative: 1 %
Eosinophils Absolute: 0.1 K/uL (ref 0.0–0.5)
Eosinophils Relative: 1 %
HCT: 25.9 % — ABNORMAL LOW (ref 36.0–46.0)
Hemoglobin: 8 g/dL — ABNORMAL LOW (ref 12.0–15.0)
Immature Granulocytes: 0 %
Lymphocytes Relative: 20 %
Lymphs Abs: 1.1 K/uL (ref 0.7–4.0)
MCH: 22.7 pg — ABNORMAL LOW (ref 26.0–34.0)
MCHC: 30.9 g/dL (ref 30.0–36.0)
MCV: 73.6 fL — ABNORMAL LOW (ref 80.0–100.0)
Monocytes Absolute: 0.6 K/uL (ref 0.1–1.0)
Monocytes Relative: 11 %
Neutro Abs: 3.9 K/uL (ref 1.7–7.7)
Neutrophils Relative %: 67 %
Platelet Count: 250 K/uL (ref 150–400)
RBC: 3.52 MIL/uL — ABNORMAL LOW (ref 3.87–5.11)
RDW: 20.2 % — ABNORMAL HIGH (ref 11.5–15.5)
WBC Count: 5.7 K/uL (ref 4.0–10.5)
nRBC: 0 % (ref 0.0–0.2)

## 2024-05-28 LAB — IRON AND IRON BINDING CAPACITY (CC-WL,HP ONLY)
Iron: 20 ug/dL — ABNORMAL LOW (ref 28–170)
Saturation Ratios: 6 % — ABNORMAL LOW (ref 10.4–31.8)
TIBC: 351 ug/dL (ref 250–450)
UIBC: 332 ug/dL

## 2024-05-28 LAB — FERRITIN: Ferritin: 18 ng/mL (ref 11–307)

## 2024-05-28 NOTE — Telephone Encounter (Signed)
 Auth Submission: NO AUTH NEEDED Site of care: [facility] Payer: [employer] MEDICARE Medication & CPT/J Code(s) submitted: Ferrlecit (Ferric Gluconate) [id number] Diagnosis Code: D50.0 Route of submission (phone, fax, portal):  Phone # Fax # Auth type: Buy/Bill HB Units/visits requested: 250MG  X 4 DOSES Reference number:  Approval from: 05/28/2024 to 07/25/24    Dagoberto Armour, CPhT [facility] Phone: [phone] 05/28/2024

## 2024-05-28 NOTE — Progress Notes (Signed)
 [facility] Telephone:([phone]) [phone]   Fax:([phone]) [phone]  OFFICE PROGRESS NOTE  Jarold Medici, MD [address] [place] [address]  DIAGNOSIS: Iron  deficiency anemia    PRIOR THERAPY: None   CURRENT THERAPY:  1) oral iron  supplement OTC tablet p.o. daily 2) IV iron  as needed most recent with Ferrlecit 250 mg in June 2025  INTERVAL HISTORY: [name] 78 y.o. female returns to the clinic today for follow-up visit.Discussed the use of AI scribe software for clinical note transcription with the patient, who gave verbal consent to proceed.  History of Present Illness [name] is a 78 year old female with iron  deficiency anemia who presents for evaluation and repeat blood work.  She has a history of iron  deficiency anemia and is currently receiving iron  infusions on an as-needed basis. She also takes over-the-counter iron  tablets. Her hemoglobin levels have been fluctuating, with recent values of 7.8 in October, 8.4 in early November, and currently 8.0. Her iron  levels remain low.  She recently completed physical therapy for balance issues after experiencing a couple of falls. No further details are provided on the outcome of the therapy or her current status of balance.  She experiences chest pressure that 'comes and goes.' She has a follow-up appointment with her next week.     MEDICAL HISTORY: Past Medical History:  Diagnosis Date   Abnormal liver function     in the past.   Anemia    Arthritis    all over   Bruises easily    Cataract    right eye;immature   CHF (congestive heart failure) (HCC)    Chronic back pain    stenosis   Chronic cough    Chronic kidney disease    COPD (chronic obstructive pulmonary disease) (HCC)    COVID    aug/sept 2021   Demand myocardial infarction [facility]) 2012   Demand Infarction in setting of Pancreatitis --> mild Troponin elevation, NON-OBSTRUCTIVE CAD   Depression    takes Abilify  daily  as well as Zoloft    Diastolic heart failure 2010   Grade 1 diastolic Dysfunction by Echo    Diverticulosis    DM (diabetes mellitus) (HCC)    takes Victoza  daily as well as Lantus  and Humalog    Empty sella    on MRI in 2009.   Glaucoma    Headache(784.0)    last migraine-4-33yrs ago   History of blood transfusion    no abnormal reaction noted   History of colon polyps    benign   HTN (hypertension)    takes Benicar ,Imdur ,and Bystolic  daily   Hyperlipidemia    takes Lipitor daily   Joint swelling    Nocturia    Obesity hypoventilation syndrome (HCC)    Obstructive sleep apnea 02/2018   Notably improved split-night study with weight loss from 270 (2017) down to 250 pounds (2019).   Pancreatitis    takes Pancrelipase  daily   Parkinson's disease (HCC)    takes Sinemet  daily   Peripheral neuropathy    Pneumonia 2012   Seizures (HCC)    takes Lamictal  daily and Primidone  nightly;last seizure 2wks ago   Urinary urgency    With increased frequency   Varicose veins of both lower extremities with pain    With edema.  Takes daily Lasix     ALLERGIES:  is allergic to other, penicillins, sulfa antibiotics, aspirin , and codeine.  MEDICATIONS:  Current Outpatient Medications  Medication Sig  Dispense Refill   acetaminophen  (TYLENOL ) 500 MG tablet Take 500-1,000 mg by mouth every 4 (four) hours as needed for moderate pain or mild pain.     albuterol  (VENTOLIN  HFA) 108 (90 Base) MCG/ACT inhaler Inhale 1-2 puffs into the lungs every 6 (six) hours as needed for wheezing or shortness of breath. 17 each 5   Alcaftadine (LASTACAFT) 0.25 % SOLN Place 1-2 drops into both eyes daily as needed (allergy eye/irritation.).     Alcohol  Swabs (DROPSAFE ALCOHOL  PREP) 70 % PADS USE TWO TIMES DAILY AS DIRECTED 200 each 3   amiodarone  (PACERONE ) 100 MG tablet TAKE 1 TABLET EVERY DAY 90 tablet 3   apixaban  (ELIQUIS ) 5 MG TABS tablet Take 1 tablet (5 mg total) by mouth 2 (two) times daily. 180 tablet 3    Ascorbic Acid (VITAMIN C) 1000 MG tablet Take 1,000 mg by mouth in the morning.     atorvastatin  (LIPITOR) 40 MG tablet TAKE 1 TABLET EVERY DAY 90 tablet 3   B-D ULTRAFINE III SHORT PEN 31G X 8 MM MISC USE DAILY WITH VICTOZA  AND/OR NOVOLOG . 400 each 2   benzonatate  (TESSALON ) 100 MG capsule Take 1 capsule by mouth three times daily as needed for cough 30 capsule 0   Blood Glucose Monitoring Suppl (TRUE METRIX AIR GLUCOSE METER) w/Device KIT USE AS DIRECTED 1 kit 3   carvedilol  (COREG ) 12.5 MG tablet TAKE 1 TABLET TWICE DAILY 180 tablet 3   Dasiglucagon  HCl (ZEGALOGUE ) 0.6 MG/0.6ML SOAJ Inject 0.6 mg into the skin daily as needed (use for extremely low blood sugars, less than 60). 0.6 mL 3   diclofenac  sodium (VOLTAREN ) 1 % GEL Apply 2 g topically 4 (four) times daily as needed (pain).     ferrous sulfate  324 MG TBEC Take 324 mg by mouth daily.     furosemide  (LASIX ) 20 MG tablet Take 4 tablets (80 mg total) by mouth twice a day. 720 tablet 3   HUMALOG  KWIKPEN 100 UNIT/ML KwikPen USE AS DIRECTED BY YOUR PRESCRIBER. COMPLETE DIRECTIONS ARE INCLUDED IN A LETTER WITH YOUR ORIGINAL ORDER 45 mL 3   hydrALAZINE  (APRESOLINE ) 100 MG tablet TAKE 1 TABLET THREE TIMES DAILY (DOSE CHANGE, REPLACES PREVIOUS PRESCRIPTIONS) 270 tablet 3   hydrocortisone cream 1 % Apply 1 application topically daily as needed for itching.      Insulin  Glargine (TOUJEO  SOLOSTAR [place]) Inject 20 Units into the skin at bedtime.     Isopropyl Alcohol  (ALCOHOL  WIPES) 70 % MISC Apply 1 each topically 2 (two) times daily. 300 each 3   isosorbide  mononitrate (IMDUR ) 30 MG 24 hr tablet TAKE 1 TABLET EVERY DAY 30 tablet 0   lamoTRIgine  (LAMICTAL ) 200 MG tablet Take 1 tablet (200 mg total) by mouth 2 (two) times daily. 180 tablet 3   levETIRAcetam  (KEPPRA ) 500 MG tablet Take 2 tablets every night 180 tablet 3   loratadine  (CLARITIN ) 10 MG tablet Take 1 tablet (10 mg total) by mouth daily. 30 tablet 2   Multiple Vitamin (MULTIVITAMIN) tablet  Take 1 tablet by mouth daily.     oxyCODONE  (OXY IR/ROXICODONE ) 5 MG immediate release tablet Take 5 mg by mouth every 6 (six) hours as needed (pain.).     OZEMPIC , 1 MG/DOSE, 4 MG/3ML SOPN INJECT 1MG  UNDER THE SKIN ONE TIME WEEKLY ON WEDNESDAYS 9 mL 3   Pancrelipase , Lip-Prot-Amyl, 25000-79000 units CPEP Take 2 capsules by mouth in the morning, at noon, and at bedtime.     potassium chloride  SA (KLOR-CON  M)  20 MEQ tablet Take 3 tablets (60 mEq total) by mouth daily. 300 tablet 3   sennosides-docusate sodium  (SENOKOT-S) 8.6-50 MG tablet Take 1-2 tablets by mouth at bedtime as needed for constipation.     sertraline  (ZOLOFT ) 50 MG tablet TAKE 1 TABLET EVERY DAY 90 tablet 3   tiZANidine  (ZANAFLEX ) 2 MG tablet TAKE 1 TABLET AT BEDTIME AS NEEDED 30 tablet 11   tolnaftate (TINACTIN) 1 % cream Apply 1 application topically daily as needed (foot fungus).      TRUE METRIX BLOOD GLUCOSE TEST test strip USE AS DIRECTED TO CHECK BLOOD SUGARS 2 TIMES PER DAY 200 strip 3   TRUEplus Lancets 33G MISC TEST BLOOD SUGAR TWICE DAILY AS DIRECTED 200 each 3   No current facility-administered medications for this visit.   Facility-Administered Medications Ordered in Other Visits  Medication Dose Route Frequency Provider Last Rate Last Admin   0.9 %  sodium chloride  infusion (Manually program via Guardrails IV Fluids)   Intravenous Once [place], Harlene HERO, FNP       0.9 %  sodium chloride  infusion (Manually program via Guardrails IV Fluids)   Intravenous Once [place], Harlene HERO, FNP        SURGICAL HISTORY:  Past Surgical History:  Procedure Laterality Date   ABDOMINAL HYSTERECTOMY     APPENDECTOMY     ATRIAL SEPTAL DEFECT(ASD) CLOSURE N/A 01/02/2023   Procedure: ATRIAL SEPTAL DEFECT(ASD) CLOSURE;  Surgeon: Wonda Sharper, MD;  Location: [facility] INVASIVE CV LAB;  Service: Cardiovascular;  Laterality: N/A;   BACK SURGERY     Cardiac Event Monitor  September-October 2017   Sinus rhythm with occasional PACs and  artifact. No arrhythmias besides one short run of tachycardia.   CARDIOVERSION N/A 08/15/2022   Procedure: CARDIOVERSION;  Surgeon: Rolan Ezra RAMAN, MD;  Location: [facility] ENDOSCOPY;  Service: Cardiovascular;  Laterality: N/A;   CARDIOVERSION N/A 02/04/2023   Procedure: CARDIOVERSION;  Surgeon: Rolan Ezra RAMAN, MD;  Location: [facility] INVASIVE CV LAB;  Service: Cardiovascular;  Laterality: N/A;   CHOLECYSTECTOMY     COLONOSCOPY N/A 08/15/2012   Procedure: COLONOSCOPY;  Surgeon: Elsie Cree, MD;  Location: [facility] ENDOSCOPY;  Service: Endoscopy;  Laterality: N/A;   COLONOSCOPY WITH PROPOFOL  Left 06/17/2017   Procedure: COLONOSCOPY WITH PROPOFOL ;  Surgeon: Saintclair Jasper, MD;  Location: [facility] ENDOSCOPY;  Service: Gastroenterology;  Laterality: Left;   COLONOSCOPY WITH PROPOFOL  N/A 04/03/2021   Procedure: COLONOSCOPY WITH PROPOFOL ;  Surgeon: Rosalie Kitchens, MD;  Location: [facility] ENDOSCOPY;  Service: Endoscopy;  Laterality: N/A;   CORONARY CALCIUM  SCORE AND CTA  04/2018   Coronary calcium  score 71.9.  Very large, hyperdynamic LAD wrapping apex giving rise to PDA.  Moderate LAD-diagonal and circumflex plaque -> CT FFR suggested positive findings in distal D1, D2 and distal circumflex.  Referred for cath. ==> FALSE POSITIVE   ESOPHAGOGASTRODUODENOSCOPY     eye cysts Bilateral    LASIK     LEFT HEART CATH AND CORONARY ANGIOGRAPHY N/A 05/16/2018   Procedure: LEFT HEART CATH AND CORONARY ANGIOGRAPHY;  Surgeon: Claudene Victory ORN, MD;  Location: [facility] INVASIVE CV LAB:  Angiographically normal coronary arteries with LVEDP of 21 mmHg.  Large draping hyperdominant LAD that wraps the apex and provides distal half of the PDA.  Relatively small caliber distal Cx -->proxLPDA, non-dom RCA. No Cx or Diag lesions -- FALSE + CT FFR   LEFT HEART CATH AND CORONARY ANGIOGRAPHY  2009/March 2012   2009: (Dr. Hershel) Nonobstructive CAD; 2012: Minimal CAD --> false positive stress test  NM MYOVIEW  LTD  01/2016; 01/2018   a)LOW RISK. No ischemia or  infarction;; b) EF 55-60%. NO ST changes.  No ischemia or Infarction. NO significant RV enlargement.  LOW RISK.   POLYPECTOMY  04/03/2021   Procedure: POLYPECTOMY;  Surgeon: Rosalie Kitchens, MD;  Location: [facility] ENDOSCOPY;  Service: Endoscopy;;   Polysomnogram  02/2018   (Dr. Chalice from neurology): Split study was not adequately done due to low AHI.  She slept reclined and had an AHI less than 10, prolonged hypoxemia and no hypercapnia. -->  She has home oxygen  already prescribed, but did not meet criteria for nightly oxygen .  (Was noted that the patient did not cooperate well with study.  Titration was discussed.   PRESSURE SENSOR/CARDIOMEMS N/A 11/09/2019   Procedure: PRESSURE SENSOR/CARDIOMEMS;  Surgeon: Rolan Ezra RAMAN, MD;  Location: [facility] INVASIVE CV LAB;  Service: Cardiovascular;  Laterality: N/A;   RECTAL POLYPECTOMY     RIGHT HEART CATH N/A 09/07/2022   Procedure: RIGHT HEART CATH;  Surgeon: Rolan Ezra RAMAN, MD;  Location: [facility] INVASIVE CV LAB;  Service: Cardiovascular;  Laterality: N/A;   RIGHT/LEFT HEART CATH AND CORONARY ANGIOGRAPHY N/A 04/06/2019   Procedure: RIGHT/LEFT HEART CATH AND CORONARY ANGIOGRAPHY;  Surgeon: Anner Alm ORN, MD;  Location: [facility] INVASIVE CV LAB; angiographically normal coronary arteries.  Moderately elevated AD LVEDP..  Mild pulmonary hypertension: PA P 56/14 mmHg-mean 31 mmHg.  RAP 10 mmHg.  RV P-EDP 54/4 mmHg - 12 mmHg.  PCWP 11-15 mmHg.  CO-CI: 7.35-3.73.   TEE WITHOUT CARDIOVERSION N/A 08/15/2022   Procedure: TRANSESOPHAGEAL ECHOCARDIOGRAM (TEE);  Surgeon: Rolan Ezra RAMAN, MD;  Location: [facility] ENDOSCOPY;  Service: Cardiovascular;  Laterality: N/A;   TONSILLECTOMY     TRANSTHORACIC ECHOCARDIOGRAM  01/2016; 05/2017   A) Normal LV chamber size with mod LVH pattern. EF 50-55%. Severe LA dilation. Mod RA dilation. PAP elevated at 38 mmHg;; B) Mild concentric LVH.  EF 55-60% & no RWMA.  Grade 2 DD.  Diastolic flattening of the ventricular septum == ? elevated PAP.  Mild aortic  valve calcification/sclerosis.  Mod LA dilation.  Mild RV dilation & Mod RA dilation   TRANSTHORACIC ECHOCARDIOGRAM  10/2017; 01/2018   a) EF 55-60%.  No RWMA,  Moderate LA dilation.  Mild LA dilation.  Peak PA pressures ~57 mmHg (moderate);;; b) EF 55-60%.  GRII DD.  Suggestion of RV volume overload with moderate RV dilation.  Severe LA and RA dilation.SABRA  PA pressure ~67%.   TRANSTHORACIC ECHOCARDIOGRAM  01/2019   Normal LV size and function.  EF 50 to 55%.  Impaired relaxation.  RV appears to have moderately reduced function.  Severely enlarged.  Cannot fully assess RV pressures.  Hypermobile interatrial septum.  Right atrium severely dilated.   TUBAL LIGATION     vaginal cyst removed     several times    REVIEW OF SYSTEMS:  A comprehensive review of systems was negative except for: Constitutional: positive for fatigue Respiratory: positive for pleurisy/chest pain   PHYSICAL EXAMINATION: General appearance: alert, cooperative, fatigued, and no distress Head: Normocephalic, without obvious abnormality, atraumatic Neck: no adenopathy, no JVD, supple, symmetrical, trachea midline, and thyroid  not enlarged, symmetric, no tenderness/mass/nodules Lymph nodes: Cervical, supraclavicular, and axillary nodes normal. Resp: clear to auscultation bilaterally Back: symmetric, no curvature. ROM normal. No CVA tenderness. Cardio: regular rate and rhythm, S1, S2 normal, no murmur, click, rub or gallop GI: soft, non-tender; bowel sounds normal; no masses,  no organomegaly Extremities: extremities normal, atraumatic, no cyanosis or edema  ECOG PERFORMANCE STATUS: 1 - Symptomatic but completely ambulatory  Blood pressure 132/63, pulse 64, temperature 98.2 F (36.8 C), temperature source Temporal, resp. rate 17, height 5' 1 (1.549 m), weight 176 lb 3.2 oz (79.9 kg), SpO2 99%.  LABORATORY DATA: Lab Results  Component Value Date   WBC 5.7 05/28/2024   HGB 8.0 (L) 05/28/2024   HCT 25.9 (L) 05/28/2024    MCV 73.6 (L) 05/28/2024   PLT 250 05/28/2024      Chemistry      Component Value Date/Time   NA 137 03/21/2024 1135   NA 141 08/29/2023 1300   K 3.3 (L) 03/21/2024 1135   CL 102 03/21/2024 1135   CO2 26 03/21/2024 1135   BUN 10 03/21/2024 1135   BUN 13 08/29/2023 1300   CREATININE 1.00 03/21/2024 1135   CREATININE 0.99 02/26/2024 1109   CREATININE 0.91 10/04/2016 1144      Component Value Date/Time   CALCIUM  8.6 (L) 03/21/2024 1135   ALKPHOS 107 03/21/2024 1135   AST 26 03/21/2024 1135   AST 19 02/26/2024 1109   ALT 23 03/21/2024 1135   ALT 18 02/26/2024 1109   BILITOT 0.5 03/21/2024 1135   BILITOT 0.3 02/26/2024 1109       RADIOGRAPHIC STUDIES: DG Foot Complete Left Result Date: 05/18/2024 Please see detailed radiograph report in office note.  DG Foot Complete Right Result Date: 05/18/2024 Please see detailed radiograph report in office note.  DG Ankle Complete Left Result Date: 05/18/2024 Please see detailed radiograph report in office note.  DG Ankle Complete Right Result Date: 05/18/2024 Please see detailed radiograph report in office note.   ASSESSMENT AND PLAN: This is a very pleasant 78 years old African-American female with iron  deficiency anemia secondary to gastrointestinal blood loss.  She is status post iron  infusion with Venofer  in June 2024 and she is currently on oral iron  tablet on daily basis.   Repeat CBC today showed hemoglobin of 8.0, hematocrit 25.9% with MCV of 73.6.  Serum iron  was 20 with iron  saturation of 6% Assessment and Plan Assessment & Plan Iron  deficiency anemia Chronic iron  deficiency anemia with hemoglobin level at 8.0 g/dL, consistent with previous levels of 7.8 g/dL in October and 8.4 g/dL in early November. Iron  levels remain low, necessitating continued iron  supplementation. - Will administer iron  infusions with Ferrlecit at the [facility]. - Continue over-the-counter iron  tablets. - Scheduled follow-up  appointment in three months. The patient was advised to call immediately if she has any other concerning symptoms in the interval.  The patient voices understanding of current disease status and treatment options and is in agreement with the current care plan.  All questions were answered. The patient knows to call the clinic with any problems, questions or concerns. We can certainly see the patient much sooner if necessary.  The total time spent in the appointment was 20 minutes.  Disclaimer: This note was dictated with voice recognition software. Similar sounding words can inadvertently be transcribed and may not be corrected upon review.

## 2024-05-31 ENCOUNTER — Other Ambulatory Visit (HOSPITAL_COMMUNITY): Payer: Self-pay | Admitting: Cardiology

## 2024-06-08 ENCOUNTER — Telehealth (HOSPITAL_COMMUNITY): Payer: Self-pay

## 2024-06-08 NOTE — Telephone Encounter (Signed)
 Called to confirm/remind patient of their appointment at [facility] on 06/09/24.   Appointment:   [x] Confirmed  [] Left mess   [] No answer/No voice mail  [] VM Full/unable to leave message  [] Phone not in service  Patient reminded to bring all medications and/or complete list.  Confirmed patient has transportation. Gave directions, instructed to utilize valet parking.

## 2024-06-08 NOTE — Progress Notes (Signed)
 PCP: Jarold Medici, MD Cardiology: Dr. Anner HF Cardiology: Dr. Rolan  CC: CHF  78 y.o. with history of OHS/OSA and RV dysfunction was referred by Dr. Anner for evaluation of CHF.  She has a history of OSA/OHS.  She uses CPAP at night but not currently using oxygen  during the day.  Remote smoker.  She had an echo in 9/20 showing EF 50-55% but moderate-severe RV dysfunction with mild-moderate RV dilation.  RHC/LHC in 10/20 showed normal coronaries, pulmonary venous hypertension.  V/Q scan in 2/21 did not show evidence for acute on chronic PE.    Cardiomems was placed in 5/21.  RHC at that time showed normal filling pressures and moderate pulmonary hypertension but suspect due to high output.  No evidence for shunt lesion.  Echo showed EF 60-65%, enlarged RV with normal systolic function.   She was admitted in 5/21 with syncope, thought to be due to a seizure.    She had COVID-19 infection in 8/21. 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   Echo in 9/21 with EF 50-55%, severe RV enlargement and severely decreased RV systolic function, PASP 54 mmHg.    She was admitted in 5/22 with CHF, bronchitis.  She was diuresed and treated with antibiotics. Echo in 5/22 showed EF 60-65%, mild LVH, ?normal RV, severe LAE, ?normal PA pressure, IVC normal.  V/Q scan negative.   In 10/22, she was admitted with GI bleeding, thought to be diverticular.  BP meds were decreased and Lasix  was cut back.  With increased weight, she increased her Lasix  back to 80 mg daily.   She was seen in the ER in 1/24 with atypical atrial flutter, this was new.  She was started on apixaban  and sent home from ER. She was seen for follow up and planned to set her up for DCCV, but she was admitted on 07/25/22 with GI bleed, thought to be diverticular.  Apixaban  was held and she was tranfused. Apixaban  was restarted after discharge.   She did not have endoscopy.   Follow up 08/01/22, she remained in atypical atrial flutter. Arranged for TEE/DCCV.  TEE/DCCV showed EF 60-65%, mild LVH, RV normal, small ASD to L->R flow, no LAA thrombus. S/p DCCV --> NSR.  RHC (3/24) showed normal filling pressures, moderate pulmonary hypertension in setting of ASD, Qp/Qs 1.5/1. She was referred to Structural Heart team for ASD repair consideration.  S/p ASD repair 7/24. Ltd echo 7/24 showed EF 55-60%, RV function moderately reduced.  Follow up 7/24, found to be back in AFL, rate controlled. Amiodarone  started, and underwent DCCV 02/04/23 to NSR.  Ltd Echo with bubble study 8/24 showed EF 60-65%, G2DD, normal RV, PASP 44.5 mmhg, no evidence of shunting, IVC dilated.  Today she returns for HF follow up. She is short of breath walking about 100-200 feet chronically.  Occasional atypical (nonexertional) chest pain that is transient.  She chronically sleeps on 3 pillows.  Occasionally gets mildly lightheaded.  With standing.  No palpitations.  She is taking Lasix  80 mg bid. She is not using her CPAP regularly.  She has been getting sleepy during the day without it.    ECG (personally reviewed): NSR, 1st degree AVB, iRBBB  Cardiomems: PADP 21, goal 20  Labs (1/24): K 3.5, creatinine 0.92, BNP 167 Labs (2/24): transferrin saturation 7%, hgb 8.1 Labs (3/24): K 4.0, creatinine 0.99 Labs (7/24): K 3.5, creatinine 0.90 Labs (10/24): LFTs normal,  K 3.8, creatinine 0.97 Labs (11/24): TSH mildly elevated 4.7 Labs (12/24): hgb 9, K 3.8, creatinine 0.91, THS normal, LFTs normal Labs (4/25): K 2.8, creatinine 1.39  PMH: 1. OHS/OSA: She uses CPAP.  2. H/o CVA 3. Seizure disorder 4. Type 2 DM 5. Depression 6. HTN 7. Hyperlipidemia 8. Chronic diastolic CHF: Echo (9/20) with EF 50-55%, moderate to severe RV dilation with mild-moderately decreased RV systolic function.  - RHC/LHC (10/20): normal coronaries; mean RA 10, PA 56/14 mean 31, mean PCWP 13,  LVEDP 18, CO/CI 7.35/3.73, PVR 2.44.  - RHC/Cardiomems placement (5/21): mean RA 7, PA 58/21 mean 34, PCWP mean 12, CI 8.3 F/3.95 T, PVR 2.86 WU. No evidence for shunt lesion.  - Echo (5/21): EF 60-65%, RV enlarged with normal systolic function.  - Echo (9/21): EF 50-55%, severe RV enlargement and severely decreased RV systolic function, PASP 54 mmHg.  - Echo (5/22): EF 60-65%, mild LVH, ?normal RV, severe LAE, ?normal PA pressure, IVC normal. - TEE (2/23): EF 60-65%, mild LVH, RV normal - RHC (3/24): RA mean 6, PA 57/12 (mean 31), PCWP mean 13, CO/CI (Fick) 17/9.4, Qp/Qs 1.5/1 - Ltd Echo with bubble study (8/24):EF 60-65%, G2DD, normal RV, PASP 44.5 mmhg, moderately decreased RV systolic function, no evidence of shunting, IVC dilated. 9. Primarily pulmonary venous hypertension.  - V/Q scan (2/21, 5/22): No evidence for acute or chronic PE.  - PFTs normal in 5/21.  10. COVID-19 PNA 8/21.  - CT chest in 10/21 with emphysema and mild fibrosis concerning for post-infectious inflammatory fibrosis from COVID.  11. GI bleeding: 10/22, diverticular bleed.  - Presumed diverticular bleed in 1/24.  12. Atypical atrial flutter/atrial fibrillation: First noted 1/24.  - TEE/DCCV (2/24) to NSR - DCCV (8/24) to NSR 13. ASD: RHC (3/24) showed Qp/Qs 1.5/1, suspect hemodynamically significant ASD. - s/p ASD closure (7/24) - Ltd echo w/ bubble study (8/24) showed no evidence of shunting, stable well-seated ASD closure device.  Social History   Socioeconomic History   Marital status: Divorced    Spouse name: Not on file   Number of children: Not on file   Years of education: Not on file   Highest education level: Not on file  Occupational History   Occupation: Disabled    Comment: CNA  Tobacco Use   Smoking status: Former    Current packs/day: 0.50    Average packs/day: 0.5 packs/day for 25.0 years (12.5 ttl pk-yrs)    Types: Cigarettes   Smokeless tobacco: Never   Tobacco comments:    quit  smoking 20+ytrs ago  Vaping Use   Vaping status: Never Used  Substance and Sexual Activity   Alcohol  use: No   Drug use: No   Sexual activity: Not Currently    Birth control/protection: Surgical  Other Topics Concern   Not on file  Social History Narrative   She lives in [place]. She has lots of family in the area and is accompanied by her sister.   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   She has a distant history of smoking, quit 20 years ago.   Right handed    Social Drivers of Health   Tobacco Use: Medium Risk (05/18/2024)   Patient History    Smoking Tobacco Use: Former    Smokeless Tobacco Use: Never    Passive Exposure: Not on file  Financial Resource Strain: Low Risk (12/04/2023)   Overall Financial Resource Strain (CARDIA)    Difficulty of Paying  Living Expenses: Not hard at all  Food Insecurity: No Food Insecurity (12/04/2023)   Hunger Vital Sign    Worried About Running Out of Food in the Last Year: Never true    Ran Out of Food in the Last Year: Never true  Transportation Needs: No Transportation Needs (12/04/2023)   PRAPARE - Administrator, Civil Service (Medical): No    Lack of Transportation (Non-Medical): No  Physical Activity: Insufficiently Active (12/04/2023)   Exercise Vital Sign    Days of Exercise per Week: 2 days    Minutes of Exercise per Session: 20 min  Stress: Stress Concern Present (12/04/2023)   [employer] of Occupational Health - Occupational Stress Questionnaire    Feeling of Stress : To some extent  Social Connections: Moderately Isolated (12/04/2023)   Social Connection and Isolation Panel    Frequency of Communication with Friends and Family: More than three times a week    Frequency of Social Gatherings with Friends and Family: Once a week    Attends Religious Services: More than 4 times per year    Active Member of [employer] or Organizations: No    Attends Banker Meetings:  Never    Marital Status: Divorced  Catering Manager Violence: Not At Risk (12/04/2023)   Humiliation, Afraid, Rape, and Kick questionnaire    Fear of Current or Ex-Partner: No    Emotionally Abused: No    Physically Abused: No    Sexually Abused: No  Depression (PHQ2-9): High Risk (04/30/2024)   Depression (PHQ2-9)    PHQ-2 Score: 14  Alcohol  Screen: Low Risk (12/04/2023)   Alcohol  Screen    Last Alcohol  Screening Score (AUDIT): 0  Housing: Unknown (12/04/2023)   Housing Stability Vital Sign    Unable to Pay for Housing in the Last Year: No    Number of Times Moved in the Last Year: Not on file    Homeless in the Last Year: No  Utilities: Not At Risk (12/04/2023)   AHC Utilities    Threatened with loss of utilities: No  Health Literacy: Adequate Health Literacy (12/04/2023)   B1300 Health Literacy    Frequency of need for help with medical instructions: Never   Family History  Problem Relation Age of Onset   Allergies Father    Heart disease Father        before 69   Heart failure Father    Hypertension Father    Hyperlipidemia Father    Heart disease Mother    Diabetes Mother    Hypertension Mother    Hyperlipidemia Mother    Hypertension Sister    Heart disease Sister        before 62   Hyperlipidemia Sister    Diabetes Brother    Hypertension Brother    Diabetes Sister    Hypertension Sister    Diabetes Son    Hypertension Son    Cancer Other    Current Outpatient Medications  Medication Sig Dispense Refill   acetaminophen  (TYLENOL ) 500 MG tablet Take 500-1,000 mg by mouth every 4 (four) hours as needed for moderate pain or mild pain.     albuterol  (VENTOLIN  HFA) 108 (90 Base) MCG/ACT inhaler Inhale 1-2 puffs into the lungs every 6 (six) hours as needed for wheezing or shortness of breath. 17 each 5   Alcaftadine (LASTACAFT) 0.25 % SOLN Place 1-2 drops into both eyes daily as needed (allergy eye/irritation.).     Alcohol  Swabs (DROPSAFE ALCOHOL  PREP) 70 %  PADS USE  TWO TIMES DAILY AS DIRECTED 200 each 3   amiodarone  (PACERONE ) 100 MG tablet TAKE 1 TABLET EVERY DAY 90 tablet 3   apixaban  (ELIQUIS ) 5 MG TABS tablet Take 1 tablet (5 mg total) by mouth 2 (two) times daily. 180 tablet 3   Ascorbic Acid (VITAMIN C) 1000 MG tablet Take 1,000 mg by mouth in the morning.     atorvastatin  (LIPITOR) 40 MG tablet TAKE 1 TABLET EVERY DAY 90 tablet 3   B-D ULTRAFINE III SHORT PEN 31G X 8 MM MISC USE DAILY WITH VICTOZA  AND/OR NOVOLOG . 400 each 2   benzonatate  (TESSALON ) 100 MG capsule Take 1 capsule by mouth three times daily as needed for cough 30 capsule 0   Blood Glucose Monitoring Suppl (TRUE METRIX AIR GLUCOSE METER) w/Device KIT USE AS DIRECTED 1 kit 3   carvedilol  (COREG ) 12.5 MG tablet TAKE 1 TABLET TWICE DAILY 180 tablet 3   Dasiglucagon  HCl (ZEGALOGUE ) 0.6 MG/0.6ML SOAJ Inject 0.6 mg into the skin daily as needed (use for extremely low blood sugars, less than 60). 0.6 mL 3   diclofenac  sodium (VOLTAREN ) 1 % GEL Apply 2 g topically 4 (four) times daily as needed (pain).     ferrous sulfate  324 MG TBEC Take 324 mg by mouth daily.     furosemide  (LASIX ) 20 MG tablet Take 4 tablets (80 mg total) by mouth twice a day. 720 tablet 3   HUMALOG  KWIKPEN 100 UNIT/ML KwikPen USE AS DIRECTED BY YOUR PRESCRIBER. COMPLETE DIRECTIONS ARE INCLUDED IN A LETTER WITH YOUR ORIGINAL ORDER 45 mL 3   hydrALAZINE  (APRESOLINE ) 100 MG tablet TAKE 1 TABLET THREE TIMES DAILY (DOSE CHANGE, REPLACES PREVIOUS PRESCRIPTIONS) 270 tablet 3   hydrocortisone cream 1 % Apply 1 application topically daily as needed for itching.      Insulin  Glargine (TOUJEO  SOLOSTAR Henry) Inject 20 Units into the skin at bedtime.     Isopropyl Alcohol  (ALCOHOL  WIPES) 70 % MISC Apply 1 each topically 2 (two) times daily. 300 each 3   isosorbide  mononitrate (IMDUR ) 30 MG 24 hr tablet TAKE 1 TABLET EVERY DAY 30 tablet 0   lamoTRIgine  (LAMICTAL ) 200 MG tablet Take 1 tablet (200 mg total) by mouth 2 (two) times daily. 180  tablet 3   levETIRAcetam  (KEPPRA ) 500 MG tablet Take 2 tablets every night 180 tablet 3   loratadine  (CLARITIN ) 10 MG tablet Take 1 tablet (10 mg total) by mouth daily. 30 tablet 2   Multiple Vitamin (MULTIVITAMIN) tablet Take 1 tablet by mouth daily.     oxyCODONE  (OXY IR/ROXICODONE ) 5 MG immediate release tablet Take 5 mg by mouth every 6 (six) hours as needed (pain.).     OZEMPIC , 1 MG/DOSE, 4 MG/3ML SOPN INJECT 1MG  UNDER THE SKIN ONE TIME WEEKLY ON WEDNESDAYS 9 mL 3   Pancrelipase , Lip-Prot-Amyl, 25000-79000 units CPEP Take 2 capsules by mouth in the morning, at noon, and at bedtime.     potassium chloride  SA (KLOR-CON  M) 20 MEQ tablet TAKE 3 TABLETS EVERY DAY 270 tablet 3   sennosides-docusate sodium  (SENOKOT-S) 8.6-50 MG tablet Take 1-2 tablets by mouth at bedtime as needed for constipation.     sertraline  (ZOLOFT ) 50 MG tablet TAKE 1 TABLET EVERY DAY 90 tablet 3   tiZANidine  (ZANAFLEX ) 2 MG tablet TAKE 1 TABLET AT BEDTIME AS NEEDED 30 tablet 0   tolnaftate (TINACTIN) 1 % cream Apply 1 application topically daily as needed (foot fungus).      TRUE METRIX BLOOD  GLUCOSE TEST test strip USE AS DIRECTED TO CHECK BLOOD SUGARS 2 TIMES PER DAY 200 strip 3   TRUEplus Lancets 33G MISC TEST BLOOD SUGAR TWICE DAILY AS DIRECTED 200 each 3   No current facility-administered medications for this visit.   Facility-Administered Medications Ordered in Other Visits  Medication Dose Route Frequency Provider Last Rate Last Admin   0.9 %  sodium chloride  infusion (Manually program via Guardrails IV Fluids)   Intravenous Once Karrisa Didio M, FNP       0.9 %  sodium chloride  infusion (Manually program via Guardrails IV Fluids)   Intravenous Once Merino, FNP       Wt Readings from Last 3 Encounters:  05/28/24 79.9 kg (176 lb 3.2 oz)  05/18/24 79.4 kg (175 lb)  04/29/24 79.4 kg (175 lb)   There were no vitals taken for this visit. General: NAD Neck: JVP 8 cm, no thyromegaly or thyroid   nodule.  Lungs: Clear to auscultation bilaterally with normal respiratory effort. CV: Nondisplaced PMI.  Heart regular S1/S2, no S3/S4, 2/6 early SEM RUSB.  No peripheral edema.  No carotid bruit.  Normal pedal pulses.  Abdomen: Soft, nontender, no hepatosplenomegaly, no distention.  Skin: Intact without lesions or rashes.  Neurologic: Alert and oriented x 3.  Psych: Normal affect. Extremities: No clubbing or cyanosis.  HEENT: Normal.   Assessment/Plan: 1. Chronic diastolic CHF/RV failure: Echo in 9/20 showed EF 50-55% with moderate-severe RV dilation and mild-moderately decreased systolic function. RHC/LHC showed mild-moderate pulmonary venous hypertension and no significant coronary disease.  Cause of RV dysfunction is not clear, possibly due to diastolic CHF + OHS/OSA.  V/Q scan in 2/21 did not show acute or chronic PE.  Cardiomems was placed in 5/21, normal filling pressures with moderate pulmonary hypertension likely due to high output (no evidence for shunt lesion on RHC).  Echo in 9/21 with EF 50-55%, severe RVE with severe RV dysfunction.  Echo in 5/22 with EF 60-65%, RV function reportedly normal with normal PA pressure.  Her dyspnea is partially related to lung disease; based on last CT, she has developed mild pulmonary fibrosis since COVID, possibly post-inflammatory.  TEE (2/24) showed EF 60-65%, RV normal. RHC (3/24) with normal filling pressures and moderate pulmonary hypertension in setting of ASD, ASD has now been closed. NYHA class III, functional class confounded by body habitus and arthritis.  Mild volume overload by exam and Cardiomems, weight is up. - Increase Lasix  to 100 mg bid x 3 days then back to 80 mg bid.  Continue to follow Cardiomems.  - Needs BMET/BNP today, may need to adjust K dosing, addition of spironolactone  or finerenone.  - Continue Coreg  12.5 mg bid. - She did not tolerate SGLT2-inhibitor due to recurrent yeast infections.    - I will arrange for repeat echo  with bubble.  2. OHS/OSA: She is supposed to be using CPAP but not oxygen  during the day (no longer appears to need). CT chest in 10/21 with emphysema and mild fibrosis, possibly post-inflammatory after COVID-19 infection.   - Followup with pulmonary (Dr. Theophilus).  - She needs to use her CPAP more regularly.  3. Pulmonary hypertension: RHC in 10/20 showed mild-moderate pulmonary venous hypertension.  She was later found to have a secundum ASD which has now been closed.  Not candidate for selective pulmonary vasodilators.  4. HTN: BP controlled.  5. Obesity: There is no height or weight on file to calculate BMI. - She is on semaglutide . 6. GI bleeding:  No further overt bleeding.  Has had IV Fe.  7. Atypical atrial flutter/atrial fibrillation: Atypical flutter at follow up 2/24.  HR controlled on Coreg . S/p TEE/DCCV to NSR. Back in AFL 7/24. Amiodarone  started and underwent DCCV 8/24 to NSR.  She is in NSR today. - Continue amiodarone  100 mg daily. Check LFTs and TSH. She will need a regular eye exam.  - Continue Coreg .  - Continue apixaban . CBC today.  - Watchman would be a reasonable consideration in the future, especially if she bleeds again.   8. ASD: small 1.9 x 1.2 cm relatively high secundum ASD with left to right flow seen on TEE. RHC (3/24) showed hemodynamically significant ASD with Qp/Qs 1.5/1. s/p ASD closure 7/24.  Followup echo in 7/24 showed well-seated closure device.  - Schedule echo with bubble.   Follow up in 4 months with APP  I spent 31 minutes reviewing records, interviewing/examining patient, and managing orders.SABRA Harlene HERO [facility]  06/08/2024

## 2024-06-09 ENCOUNTER — Ambulatory Visit (HOSPITAL_COMMUNITY)
Admission: RE | Admit: 2024-06-09 | Discharge: 2024-06-09 | Disposition: A | Source: Ambulatory Visit | Attending: Family Medicine

## 2024-06-09 ENCOUNTER — Encounter (HOSPITAL_COMMUNITY): Payer: Self-pay

## 2024-06-09 ENCOUNTER — Ambulatory Visit (HOSPITAL_COMMUNITY): Payer: Self-pay | Admitting: Family Medicine

## 2024-06-09 VITALS — BP 128/60 | HR 59 | Ht 61.0 in | Wt 173.6 lb

## 2024-06-09 DIAGNOSIS — Z6832 Body mass index (BMI) 32.0-32.9, adult: Secondary | ICD-10-CM | POA: Diagnosis not present

## 2024-06-09 DIAGNOSIS — Q2112 Patent foramen ovale: Secondary | ICD-10-CM | POA: Insufficient documentation

## 2024-06-09 DIAGNOSIS — I5032 Chronic diastolic (congestive) heart failure: Secondary | ICD-10-CM | POA: Diagnosis present

## 2024-06-09 DIAGNOSIS — Z8774 Personal history of (corrected) congenital malformations of heart and circulatory system: Secondary | ICD-10-CM | POA: Insufficient documentation

## 2024-06-09 DIAGNOSIS — I272 Pulmonary hypertension, unspecified: Secondary | ICD-10-CM | POA: Insufficient documentation

## 2024-06-09 DIAGNOSIS — I110 Hypertensive heart disease with heart failure: Secondary | ICD-10-CM | POA: Diagnosis not present

## 2024-06-09 DIAGNOSIS — Z79899 Other long term (current) drug therapy: Secondary | ICD-10-CM | POA: Insufficient documentation

## 2024-06-09 DIAGNOSIS — G40909 Epilepsy, unspecified, not intractable, without status epilepticus: Secondary | ICD-10-CM | POA: Insufficient documentation

## 2024-06-09 DIAGNOSIS — J8410 Pulmonary fibrosis, unspecified: Secondary | ICD-10-CM | POA: Insufficient documentation

## 2024-06-09 DIAGNOSIS — G4733 Obstructive sleep apnea (adult) (pediatric): Secondary | ICD-10-CM

## 2024-06-09 DIAGNOSIS — Z8719 Personal history of other diseases of the digestive system: Secondary | ICD-10-CM | POA: Insufficient documentation

## 2024-06-09 DIAGNOSIS — I4891 Unspecified atrial fibrillation: Secondary | ICD-10-CM | POA: Diagnosis not present

## 2024-06-09 DIAGNOSIS — Z87891 Personal history of nicotine dependence: Secondary | ICD-10-CM | POA: Diagnosis not present

## 2024-06-09 DIAGNOSIS — J439 Emphysema, unspecified: Secondary | ICD-10-CM | POA: Diagnosis not present

## 2024-06-09 DIAGNOSIS — I10 Essential (primary) hypertension: Secondary | ICD-10-CM

## 2024-06-09 DIAGNOSIS — I484 Atypical atrial flutter: Secondary | ICD-10-CM

## 2024-06-09 DIAGNOSIS — Z7901 Long term (current) use of anticoagulants: Secondary | ICD-10-CM | POA: Insufficient documentation

## 2024-06-09 DIAGNOSIS — E669 Obesity, unspecified: Secondary | ICD-10-CM

## 2024-06-09 DIAGNOSIS — Q211 Atrial septal defect, unspecified: Secondary | ICD-10-CM | POA: Diagnosis not present

## 2024-06-09 DIAGNOSIS — Z8616 Personal history of COVID-19: Secondary | ICD-10-CM | POA: Diagnosis not present

## 2024-06-09 LAB — BASIC METABOLIC PANEL WITH GFR
Anion gap: 9 (ref 5–15)
BUN: 10 mg/dL (ref 8–23)
CO2: 28 mmol/L (ref 22–32)
Calcium: 8.7 mg/dL — ABNORMAL LOW (ref 8.9–10.3)
Chloride: 101 mmol/L (ref 98–111)
Creatinine, Ser: 1.08 mg/dL — ABNORMAL HIGH (ref 0.44–1.00)
GFR, Estimated: 53 mL/min — ABNORMAL LOW (ref >60)
Glucose, Bld: 134 mg/dL — ABNORMAL HIGH (ref 70–99)
Potassium: 3.2 mmol/L — ABNORMAL LOW (ref 3.5–5.1)
Sodium: 138 mmol/L (ref 135–145)

## 2024-06-09 LAB — PRO BRAIN NATRIURETIC PEPTIDE: Pro Brain Natriuretic Peptide: 145 pg/mL (ref <300.0)

## 2024-06-09 MED ORDER — SPIRONOLACTONE 25 MG PO TABS: 25.0000 mg | ORAL_TABLET | Freq: Every day | ORAL | 3 refills | Status: AC

## 2024-06-09 MED ORDER — POTASSIUM CHLORIDE CRYS ER 20 MEQ PO TBCR: 40.0000 meq | EXTENDED_RELEASE_TABLET | Freq: Every day | ORAL | 3 refills | Status: AC

## 2024-06-09 NOTE — Patient Instructions (Addendum)
 Good to see you today!  START spironolactone  25 mg ( 1 tablet) daily  DECREASE potassium to 40 meq ( 2 tablets)daily  Labs done today, your results will be available in MyChart, we will contact you for abnormal readings.  Follow up lab work as scheduled  Your physician recommends that you schedule a follow-up appointment 6 months(June) Call office in April to schedule an appointment   If you have any questions or concerns before your next appointment please send us  a message through [place] or call our office at [phone].    TO LEAVE A MESSAGE FOR THE NURSE SELECT OPTION 2, PLEASE LEAVE A MESSAGE INCLUDING: YOUR NAME DATE OF BIRTH CALL BACK NUMBER REASON FOR CALL**this is important as we prioritize the call backs  YOU WILL RECEIVE A CALL BACK THE SAME DAY AS LONG AS YOU CALL BEFORE 4:00 PM At the [facility], you and your health needs are our priority. As part of our continuing mission to provide you with exceptional heart care, we have created designated Provider Care Teams. These Care Teams include your primary Cardiologist (physician) and Advanced Practice Providers (APPs- Physician Assistants and Nurse Practitioners) who all work together to provide you with the care you need, when you need it.   You may see any of the following providers on your designated Care Team at your next follow up: Dr Toribio Fuel Dr Ezra Shuck Dr. Morene Brownie Greig Mosses, NP Caffie Shed, [place] [facility] [place], [place] Beckey Coe, NP Jordan Lee, NP Ellouise Class, NP Tinnie Redman, PharmD Jaun Bash, PharmD   Please be sure to bring in all your medications bottles to every appointment.    Thank you for choosing [facility]

## 2024-06-10 ENCOUNTER — Other Ambulatory Visit (HOSPITAL_COMMUNITY): Payer: Self-pay | Admitting: Family Medicine

## 2024-06-11 ENCOUNTER — Ambulatory Visit: Admitting: Licensed Clinical Social Worker

## 2024-06-13 ENCOUNTER — Other Ambulatory Visit (HOSPITAL_COMMUNITY): Payer: Self-pay | Admitting: Family Medicine

## 2024-06-13 DIAGNOSIS — I5032 Chronic diastolic (congestive) heart failure: Secondary | ICD-10-CM

## 2024-06-15 ENCOUNTER — Emergency Department (HOSPITAL_COMMUNITY)

## 2024-06-15 ENCOUNTER — Observation Stay (HOSPITAL_COMMUNITY)

## 2024-06-15 ENCOUNTER — Observation Stay (HOSPITAL_COMMUNITY)
Admission: EM | Admit: 2024-06-15 | Discharge: 2024-06-17 | Disposition: A | Discharge status: Home / Self Care | Attending: Family Medicine | Admitting: Family Medicine

## 2024-06-15 ENCOUNTER — Other Ambulatory Visit: Payer: Self-pay

## 2024-06-15 DIAGNOSIS — I272 Pulmonary hypertension, unspecified: Secondary | ICD-10-CM | POA: Diagnosis present

## 2024-06-15 DIAGNOSIS — D500 Iron deficiency anemia secondary to blood loss (chronic): Secondary | ICD-10-CM | POA: Diagnosis not present

## 2024-06-15 DIAGNOSIS — Z87891 Personal history of nicotine dependence: Secondary | ICD-10-CM | POA: Insufficient documentation

## 2024-06-15 DIAGNOSIS — I5032 Chronic diastolic (congestive) heart failure: Secondary | ICD-10-CM | POA: Diagnosis not present

## 2024-06-15 DIAGNOSIS — G40909 Epilepsy, unspecified, not intractable, without status epilepticus: Secondary | ICD-10-CM | POA: Diagnosis not present

## 2024-06-15 DIAGNOSIS — M5030 Other cervical disc degeneration, unspecified cervical region: Secondary | ICD-10-CM | POA: Diagnosis not present

## 2024-06-15 DIAGNOSIS — Z79899 Other long term (current) drug therapy: Secondary | ICD-10-CM | POA: Insufficient documentation

## 2024-06-15 DIAGNOSIS — I69354 Hemiplegia and hemiparesis following cerebral infarction affecting left non-dominant side: Secondary | ICD-10-CM

## 2024-06-15 DIAGNOSIS — Z8673 Personal history of transient ischemic attack (TIA), and cerebral infarction without residual deficits: Secondary | ICD-10-CM | POA: Diagnosis not present

## 2024-06-15 DIAGNOSIS — E1122 Type 2 diabetes mellitus with diabetic chronic kidney disease: Secondary | ICD-10-CM | POA: Diagnosis not present

## 2024-06-15 DIAGNOSIS — I4892 Unspecified atrial flutter: Secondary | ICD-10-CM | POA: Diagnosis not present

## 2024-06-15 DIAGNOSIS — N1831 Chronic kidney disease, stage 3a: Secondary | ICD-10-CM | POA: Insufficient documentation

## 2024-06-15 DIAGNOSIS — E785 Hyperlipidemia, unspecified: Secondary | ICD-10-CM | POA: Diagnosis present

## 2024-06-15 DIAGNOSIS — G20C Parkinsonism, unspecified: Secondary | ICD-10-CM | POA: Insufficient documentation

## 2024-06-15 DIAGNOSIS — Z794 Long term (current) use of insulin: Secondary | ICD-10-CM | POA: Diagnosis not present

## 2024-06-15 DIAGNOSIS — R55 Syncope and collapse: Secondary | ICD-10-CM | POA: Diagnosis not present

## 2024-06-15 DIAGNOSIS — I130 Hypertensive heart and chronic kidney disease with heart failure and stage 1 through stage 4 chronic kidney disease, or unspecified chronic kidney disease: Secondary | ICD-10-CM | POA: Insufficient documentation

## 2024-06-15 DIAGNOSIS — R29818 Other symptoms and signs involving the nervous system: Secondary | ICD-10-CM

## 2024-06-15 DIAGNOSIS — M47812 Spondylosis without myelopathy or radiculopathy, cervical region: Secondary | ICD-10-CM

## 2024-06-15 DIAGNOSIS — G4733 Obstructive sleep apnea (adult) (pediatric): Secondary | ICD-10-CM

## 2024-06-15 DIAGNOSIS — F331 Major depressive disorder, recurrent, moderate: Secondary | ICD-10-CM | POA: Diagnosis not present

## 2024-06-15 DIAGNOSIS — J449 Chronic obstructive pulmonary disease, unspecified: Secondary | ICD-10-CM | POA: Diagnosis present

## 2024-06-15 DIAGNOSIS — Z7901 Long term (current) use of anticoagulants: Secondary | ICD-10-CM | POA: Diagnosis not present

## 2024-06-15 DIAGNOSIS — N183 Chronic kidney disease, stage 3 unspecified: Secondary | ICD-10-CM | POA: Diagnosis present

## 2024-06-15 DIAGNOSIS — I110 Hypertensive heart disease with heart failure: Secondary | ICD-10-CM | POA: Diagnosis present

## 2024-06-15 LAB — URINALYSIS, ROUTINE W REFLEX MICROSCOPIC
Bilirubin Urine: NEGATIVE
Glucose, UA: NEGATIVE mg/dL
Hgb urine dipstick: NEGATIVE
Ketones, ur: NEGATIVE mg/dL
Leukocytes,Ua: NEGATIVE
Nitrite: NEGATIVE
Protein, ur: NEGATIVE mg/dL
Specific Gravity, Urine: 1.004 — ABNORMAL LOW (ref 1.005–1.030)
pH: 7 (ref 5.0–8.0)

## 2024-06-15 LAB — COMPREHENSIVE METABOLIC PANEL WITH GFR
ALT: 25 U/L (ref 0–44)
AST: 33 U/L (ref 15–41)
Albumin: 4.2 g/dL (ref 3.5–5.0)
Alkaline Phosphatase: 139 U/L — ABNORMAL HIGH (ref 38–126)
Anion gap: 11 (ref 5–15)
BUN: 16 mg/dL (ref 8–23)
CO2: 29 mmol/L (ref 22–32)
Calcium: 9.2 mg/dL (ref 8.9–10.3)
Chloride: 98 mmol/L (ref 98–111)
Creatinine, Ser: 1.05 mg/dL — ABNORMAL HIGH (ref 0.44–1.00)
GFR, Estimated: 55 mL/min — ABNORMAL LOW
Glucose, Bld: 108 mg/dL — ABNORMAL HIGH (ref 70–99)
Potassium: 3.6 mmol/L (ref 3.5–5.1)
Sodium: 138 mmol/L (ref 135–145)
Total Bilirubin: 0.3 mg/dL (ref 0.0–1.2)
Total Protein: 8.2 g/dL — ABNORMAL HIGH (ref 6.5–8.1)

## 2024-06-15 LAB — I-STAT CHEM 8, ED
BUN: 17 mg/dL (ref 8–23)
Calcium, Ion: 1.14 mmol/L — ABNORMAL LOW (ref 1.15–1.40)
Chloride: 97 mmol/L — ABNORMAL LOW (ref 98–111)
Creatinine, Ser: 1.2 mg/dL — ABNORMAL HIGH (ref 0.44–1.00)
Glucose, Bld: 113 mg/dL — ABNORMAL HIGH (ref 70–99)
HCT: 30 % — ABNORMAL LOW (ref 36.0–46.0)
Hemoglobin: 10.2 g/dL — ABNORMAL LOW (ref 12.0–15.0)
Potassium: 3.6 mmol/L (ref 3.5–5.1)
Sodium: 139 mmol/L (ref 135–145)
TCO2: 30 mmol/L (ref 22–32)

## 2024-06-15 LAB — PROTIME-INR
INR: 1.2 (ref 0.8–1.2)
Prothrombin Time: 16.4 s — ABNORMAL HIGH (ref 11.4–15.2)

## 2024-06-15 LAB — CBC
HCT: 29.6 % — ABNORMAL LOW (ref 36.0–46.0)
Hemoglobin: 8.9 g/dL — ABNORMAL LOW (ref 12.0–15.0)
MCH: 22.3 pg — ABNORMAL LOW (ref 26.0–34.0)
MCHC: 30.1 g/dL (ref 30.0–36.0)
MCV: 74.2 fL — ABNORMAL LOW (ref 80.0–100.0)
Platelets: 315 K/uL (ref 150–400)
RBC: 3.99 MIL/uL (ref 3.87–5.11)
RDW: 19.6 % — ABNORMAL HIGH (ref 11.5–15.5)
WBC: 3.6 K/uL — ABNORMAL LOW (ref 4.0–10.5)
nRBC: 0 % (ref 0.0–0.2)

## 2024-06-15 LAB — DIFFERENTIAL
Abs Immature Granulocytes: 0 K/uL (ref 0.00–0.07)
Basophils Absolute: 0 K/uL (ref 0.0–0.1)
Basophils Relative: 1 %
Eosinophils Absolute: 0.1 K/uL (ref 0.0–0.5)
Eosinophils Relative: 1 %
Immature Granulocytes: 0 %
Lymphocytes Relative: 37 %
Lymphs Abs: 1.3 K/uL (ref 0.7–4.0)
Monocytes Absolute: 0.5 K/uL (ref 0.1–1.0)
Monocytes Relative: 14 %
Neutro Abs: 1.7 K/uL (ref 1.7–7.7)
Neutrophils Relative %: 47 %

## 2024-06-15 LAB — APTT: aPTT: 41 s — ABNORMAL HIGH (ref 24–36)

## 2024-06-15 LAB — TROPONIN T, HIGH SENSITIVITY
Troponin T High Sensitivity: 23 ng/L — ABNORMAL HIGH (ref 0–19)
Troponin T High Sensitivity: 26 ng/L — ABNORMAL HIGH (ref 0–19)

## 2024-06-15 LAB — CBG MONITORING, ED: Glucose-Capillary: 106 mg/dL — ABNORMAL HIGH (ref 70–99)

## 2024-06-15 LAB — ETHANOL: Alcohol, Ethyl (B): <15 mg/dL

## 2024-06-15 MED ORDER — ALBUTEROL SULFATE (2.5 MG/3ML) 0.083% IN NEBU: 2.5000 mg | INHALATION_SOLUTION | Freq: Four times a day (QID) | RESPIRATORY_TRACT | Status: DC | PRN

## 2024-06-15 MED ORDER — AMIODARONE HCL 200 MG PO TABS
100.0000 mg | ORAL_TABLET | Freq: Every day | ORAL | Status: DC
  Administered 2024-06-16 – 2024-06-17 (×2): 100 mg via ORAL
  Filled 2024-06-15 (×2): qty 1

## 2024-06-15 MED ORDER — SODIUM CHLORIDE 0.9% FLUSH
3.0000 mL | Freq: Two times a day (BID) | INTRAVENOUS | Status: DC
  Administered 2024-06-16 – 2024-06-17 (×4): 3 mL via INTRAVENOUS

## 2024-06-15 MED ORDER — ISOSORBIDE MONONITRATE ER 30 MG PO TB24
30.0000 mg | ORAL_TABLET | Freq: Every day | ORAL | Status: DC
  Administered 2024-06-16: 30 mg via ORAL
  Filled 2024-06-15: qty 1

## 2024-06-15 MED ORDER — SPIRONOLACTONE 25 MG PO TABS
25.0000 mg | ORAL_TABLET | Freq: Every day | ORAL | Status: DC
  Administered 2024-06-16: 25 mg via ORAL
  Filled 2024-06-15: qty 1

## 2024-06-15 MED ORDER — LEVETIRACETAM 500 MG PO TABS
500.0000 mg | ORAL_TABLET | Freq: Two times a day (BID) | ORAL | Status: DC
  Administered 2024-06-15 – 2024-06-16 (×3): 500 mg via ORAL
  Filled 2024-06-15 (×3): qty 1

## 2024-06-15 MED ORDER — FUROSEMIDE 20 MG PO TABS
80.0000 mg | ORAL_TABLET | Freq: Two times a day (BID) | ORAL | Status: DC
  Administered 2024-06-16: 80 mg via ORAL
  Filled 2024-06-15: qty 4

## 2024-06-15 MED ORDER — SENNOSIDES-DOCUSATE SODIUM 8.6-50 MG PO TABS: 1.0000 | ORAL_TABLET | Freq: Every evening | ORAL | Status: DC | PRN

## 2024-06-15 MED ORDER — SERTRALINE HCL 50 MG PO TABS
50.0000 mg | ORAL_TABLET | Freq: Every day | ORAL | Status: DC
  Administered 2024-06-16 – 2024-06-17 (×2): 50 mg via ORAL
  Filled 2024-06-15 (×2): qty 1

## 2024-06-15 MED ORDER — SODIUM CHLORIDE 0.9% FLUSH
3.0000 mL | Freq: Once | INTRAVENOUS | Status: AC
  Administered 2024-06-15: 3 mL via INTRAVENOUS

## 2024-06-15 MED ORDER — ONDANSETRON HCL 4 MG/2ML IJ SOLN: 4.0000 mg | Freq: Four times a day (QID) | INTRAMUSCULAR | Status: DC | PRN

## 2024-06-15 MED ORDER — APIXABAN 5 MG PO TABS
5.0000 mg | ORAL_TABLET | Freq: Two times a day (BID) | ORAL | Status: DC
  Administered 2024-06-15 – 2024-06-17 (×4): 5 mg via ORAL
  Filled 2024-06-15 (×4): qty 1

## 2024-06-15 MED ORDER — LAMOTRIGINE 100 MG PO TABS
200.0000 mg | ORAL_TABLET | Freq: Two times a day (BID) | ORAL | Status: DC
  Administered 2024-06-15 – 2024-06-17 (×4): 200 mg via ORAL
  Filled 2024-06-15 (×4): qty 2

## 2024-06-15 MED ORDER — IOHEXOL 350 MG/ML SOLN
75.0000 mL | Freq: Once | INTRAVENOUS | Status: AC | PRN
  Administered 2024-06-15: 75 mL via INTRAVENOUS

## 2024-06-15 MED ORDER — ACETAMINOPHEN 650 MG RE SUPP: 650.0000 mg | Freq: Four times a day (QID) | RECTAL | Status: DC | PRN

## 2024-06-15 MED ORDER — ATORVASTATIN CALCIUM 40 MG PO TABS
40.0000 mg | ORAL_TABLET | Freq: Every day | ORAL | Status: DC
  Administered 2024-06-16 – 2024-06-17 (×2): 40 mg via ORAL
  Filled 2024-06-15 (×2): qty 1

## 2024-06-15 MED ORDER — ACETAMINOPHEN 325 MG PO TABS: 650.0000 mg | ORAL_TABLET | Freq: Four times a day (QID) | ORAL | Status: DC | PRN

## 2024-06-15 MED ORDER — ONDANSETRON HCL 4 MG PO TABS: 4.0000 mg | ORAL_TABLET | Freq: Four times a day (QID) | ORAL | Status: DC | PRN

## 2024-06-15 MED ORDER — CARVEDILOL 12.5 MG PO TABS
12.5000 mg | ORAL_TABLET | Freq: Two times a day (BID) | ORAL | Status: DC
  Administered 2024-06-16 (×2): 12.5 mg via ORAL
  Filled 2024-06-15 (×2): qty 1

## 2024-06-15 NOTE — ED Notes (Signed)
 Pt req primary RN contact her son to let him know that she would be staying and to ask if her dog was given her medicine. Primary RN made contact with son Honey). Patient notified.

## 2024-06-15 NOTE — ED Notes (Signed)
 Pt ambulated to and from bathroom with no issues.

## 2024-06-15 NOTE — Consult Note (Signed)
 NEUROLOGY CONSULT NOTE   Date of service: June 15, 2024 Patient Name: [name] MRN:  [id number] DOB:  12-11-1945 Chief Complaint: Syncope at home Requesting Provider: No att. providers found  History of Present Illness  [name] is a 78 y.o. female with an extensive cardiac history, seizures (on Lamictal  and Keppra  at home), OSA, CHF, stroke with residual left sided weakness presenting after a syncopal episode. Family denied jerking, eye deviation, incontinence. She is on eliquis  for afib. Endorses taking all of her medications today. Ambulates with a walker. Code Stroke was called for left sided weakness and aphasia.   Per family, the patient was sitting and slid out of her chair. Family states she was unresponsive for 7 minutes, then rapidly returned to baseline after she regained consciousness. Pt does not recall fainting. Upon EMS arrival the patient had left sided facial drop that resolved en route, in addition to left sided weakness. Last known well 1315. On arrival, she was complaining of chest pain that radiates to her jaw. EMS vitals: 140/80 BP; 122 CBG; 96% RA; 58 HR.  LKW: 1315 Modified rankin score:3- ambulates without assistance IV Thrombolysis/EVT: No: Presentation is most consistent with syncope. Deficits on exam correspond to known chronic left sided weakness.   NIHSS components Score: Comment  1a Level of Conscious 0[x]  1[]  2[]  3[]      1b LOC Questions 0[x]  1[]  2[]       1c LOC Commands 0[x]  1[]  2[]       2 Best Gaze 0[x]  1[]  2[]       3 Visual 0[x]  1[]  2[]  3[]      4 Facial Palsy 0[x]  1[]  2[]  3[]      5a Motor Arm - left 0[]  1[]  2[x]  3[]  4[]  UN[]    5b Motor Arm - Right 0[x]  1[]  2[]  3[]  4[]  UN[]    6a Motor Leg - Left 0[]  1[]  2[x]  3[]  4[]  UN[]    6b Motor Leg - Right 0[]  1[x]  2[]  3[]  4[]  UN[]    7 Limb Ataxia 0[x]  1[]  2[]  UN[]      8 Sensory 0[x]  1[]  2[]  UN[]      9 Best Language 0[x]  1[]  2[]  3[]      10 Dysarthria 0[]  1[x]  2[]  UN[]    Baseline  11 Extinct. and  Inattention 0[x]  1[]  2[]       TOTAL:   6      ROS  Comprehensive ROS performed and pertinent positives documented in HPI    Past History   Past Medical History:  Diagnosis Date   Abnormal liver function     in the past.   Anemia    Arthritis    all over   Bruises easily    Cataract    right eye;immature   CHF (congestive heart failure) (HCC)    Chronic back pain    stenosis   Chronic cough    Chronic kidney disease    COPD (chronic obstructive pulmonary disease) (HCC)    COVID    aug/sept 2021   Demand myocardial infarction [facility]) 2012   Demand Infarction in setting of Pancreatitis --> mild Troponin elevation, NON-OBSTRUCTIVE CAD   Depression    takes Abilify  daily as well as Zoloft    Diastolic heart failure 2010   Grade 1 diastolic Dysfunction by Echo    Diverticulosis    DM (diabetes mellitus) (HCC)    takes Victoza  daily as well as Lantus  and Humalog    Empty sella    on MRI in 2009.   Glaucoma    Headache(784.0)  last migraine-4-79yrs ago   History of blood transfusion    no abnormal reaction noted   History of colon polyps    benign   HTN (hypertension)    takes Benicar ,Imdur ,and Bystolic  daily   Hyperlipidemia    takes Lipitor daily   Joint swelling    Nocturia    Obesity hypoventilation syndrome (HCC)    Obstructive sleep apnea 02/2018   Notably improved split-night study with weight loss from 270 (2017) down to 250 pounds (2019).   Pancreatitis    takes Pancrelipase  daily   Parkinson's disease (HCC)    takes Sinemet  daily   Peripheral neuropathy    Pneumonia 2012   Seizures (HCC)    takes Lamictal  daily and Primidone  nightly;last seizure 2wks ago   Urinary urgency    With increased frequency   Varicose veins of both lower extremities with pain    With edema.  Takes daily Lasix     Past Surgical History:  Procedure Laterality Date   ABDOMINAL HYSTERECTOMY     APPENDECTOMY     ATRIAL SEPTAL DEFECT(ASD) CLOSURE N/A 01/02/2023   Procedure:  ATRIAL SEPTAL DEFECT(ASD) CLOSURE;  Surgeon: Wonda Sharper, MD;  Location: [facility] INVASIVE CV LAB;  Service: Cardiovascular;  Laterality: N/A;   BACK SURGERY     Cardiac Event Monitor  September-October 2017   Sinus rhythm with occasional PACs and artifact. No arrhythmias besides one short run of tachycardia.   CARDIOVERSION N/A 08/15/2022   Procedure: CARDIOVERSION;  Surgeon: Rolan Ezra RAMAN, MD;  Location: [facility] ENDOSCOPY;  Service: Cardiovascular;  Laterality: N/A;   CARDIOVERSION N/A 02/04/2023   Procedure: CARDIOVERSION;  Surgeon: Rolan Ezra RAMAN, MD;  Location: [facility] INVASIVE CV LAB;  Service: Cardiovascular;  Laterality: N/A;   CHOLECYSTECTOMY     COLONOSCOPY N/A 08/15/2012   Procedure: COLONOSCOPY;  Surgeon: Elsie Cree, MD;  Location: [facility] ENDOSCOPY;  Service: Endoscopy;  Laterality: N/A;   COLONOSCOPY WITH PROPOFOL  Left 06/17/2017   Procedure: COLONOSCOPY WITH PROPOFOL ;  Surgeon: Saintclair Jasper, MD;  Location: [facility] ENDOSCOPY;  Service: Gastroenterology;  Laterality: Left;   COLONOSCOPY WITH PROPOFOL  N/A 04/03/2021   Procedure: COLONOSCOPY WITH PROPOFOL ;  Surgeon: Rosalie Kitchens, MD;  Location: [facility] ENDOSCOPY;  Service: Endoscopy;  Laterality: N/A;   CORONARY CALCIUM  SCORE AND CTA  04/2018   Coronary calcium  score 71.9.  Very large, hyperdynamic LAD wrapping apex giving rise to PDA.  Moderate LAD-diagonal and circumflex plaque -> CT FFR suggested positive findings in distal D1, D2 and distal circumflex.  Referred for cath. ==> FALSE POSITIVE   ESOPHAGOGASTRODUODENOSCOPY     eye cysts Bilateral    LASIK     LEFT HEART CATH AND CORONARY ANGIOGRAPHY N/A 05/16/2018   Procedure: LEFT HEART CATH AND CORONARY ANGIOGRAPHY;  Surgeon: Claudene Victory ORN, MD;  Location: [facility] INVASIVE CV LAB:  Angiographically normal coronary arteries with LVEDP of 21 mmHg.  Large draping hyperdominant LAD that wraps the apex and provides distal half of the PDA.  Relatively small caliber distal Cx -->proxLPDA, non-dom RCA. No Cx or Diag  lesions -- FALSE + CT FFR   LEFT HEART CATH AND CORONARY ANGIOGRAPHY  2009/March 2012   2009: (Dr. Hershel) Nonobstructive CAD; 2012: Minimal CAD --> false positive stress test   NM MYOVIEW  LTD  01/2016; 01/2018   a)LOW RISK. No ischemia or infarction;; b) EF 55-60%. NO ST changes.  No ischemia or Infarction. NO significant RV enlargement.  LOW RISK.   POLYPECTOMY  04/03/2021   Procedure: POLYPECTOMY;  Surgeon: Rosalie Kitchens, MD;  Location: [facility] ENDOSCOPY;  Service: Endoscopy;;   Polysomnogram  02/2018   (Dr. Chalice from neurology): Split study was not adequately done due to low AHI.  She slept reclined and had an AHI less than 10, prolonged hypoxemia and no hypercapnia. -->  She has home oxygen  already prescribed, but did not meet criteria for nightly oxygen .  (Was noted that the patient did not cooperate well with study.  Titration was discussed.   PRESSURE SENSOR/CARDIOMEMS N/A 11/09/2019   Procedure: PRESSURE SENSOR/CARDIOMEMS;  Surgeon: Rolan Ezra RAMAN, MD;  Location: [facility] INVASIVE CV LAB;  Service: Cardiovascular;  Laterality: N/A;   RECTAL POLYPECTOMY     RIGHT HEART CATH N/A 09/07/2022   Procedure: RIGHT HEART CATH;  Surgeon: Rolan Ezra RAMAN, MD;  Location: [facility] INVASIVE CV LAB;  Service: Cardiovascular;  Laterality: N/A;   RIGHT/LEFT HEART CATH AND CORONARY ANGIOGRAPHY N/A 04/06/2019   Procedure: RIGHT/LEFT HEART CATH AND CORONARY ANGIOGRAPHY;  Surgeon: Anner Alm ORN, MD;  Location: [facility] INVASIVE CV LAB; angiographically normal coronary arteries.  Moderately elevated AD LVEDP..  Mild pulmonary hypertension: PA P 56/14 mmHg-mean 31 mmHg.  RAP 10 mmHg.  RV P-EDP 54/4 mmHg - 12 mmHg.  PCWP 11-15 mmHg.  CO-CI: 7.35-3.73.   TEE WITHOUT CARDIOVERSION N/A 08/15/2022   Procedure: TRANSESOPHAGEAL ECHOCARDIOGRAM (TEE);  Surgeon: Rolan Ezra RAMAN, MD;  Location: [facility] ENDOSCOPY;  Service: Cardiovascular;  Laterality: N/A;   TONSILLECTOMY     TRANSTHORACIC ECHOCARDIOGRAM  01/2016; 05/2017   A) Normal LV  chamber size with mod LVH pattern. EF 50-55%. Severe LA dilation. Mod RA dilation. PAP elevated at 38 mmHg;; B) Mild concentric LVH.  EF 55-60% & no RWMA.  Grade 2 DD.  Diastolic flattening of the ventricular septum == ? elevated PAP.  Mild aortic valve calcification/sclerosis.  Mod LA dilation.  Mild RV dilation & Mod RA dilation   TRANSTHORACIC ECHOCARDIOGRAM  10/2017; 01/2018   a) EF 55-60%.  No RWMA,  Moderate LA dilation.  Mild LA dilation.  Peak PA pressures ~57 mmHg (moderate);;; b) EF 55-60%.  GRII DD.  Suggestion of RV volume overload with moderate RV dilation.  Severe LA and RA dilation.SABRA  PA pressure ~67%.   TRANSTHORACIC ECHOCARDIOGRAM  01/2019   Normal LV size and function.  EF 50 to 55%.  Impaired relaxation.  RV appears to have moderately reduced function.  Severely enlarged.  Cannot fully assess RV pressures.  Hypermobile interatrial septum.  Right atrium severely dilated.   TUBAL LIGATION     vaginal cyst removed     several times    Family History: Family History  Problem Relation Age of Onset   Allergies Father    Heart disease Father        before 75   Heart failure Father    Hypertension Father    Hyperlipidemia Father    Heart disease Mother    Diabetes Mother    Hypertension Mother    Hyperlipidemia Mother    Hypertension Sister    Heart disease Sister        before 56   Hyperlipidemia Sister    Diabetes Brother    Hypertension Brother    Diabetes Sister    Hypertension Sister    Diabetes Son    Hypertension Son    Cancer Other     Social History  reports that she has quit smoking. Her smoking use included cigarettes. She has a 12.5 pack-year smoking history. She has never used smokeless tobacco. She reports that she  does not drink alcohol  and does not use drugs.  Allergies[1]  Medications  Current Medications[2]  Vitals   There were no vitals filed for this visit.  There is no height or weight on file to calculate BMI.   Physical Exam    Constitutional: Appears well-developed and well-nourished.  Psych: Anxious affect   Eyes: No scleral injection.  HENT: No OP obstruction.  Head: Normocephalic.  Respiratory: Effort normal, non-labored breathing.  Skin: WDI.   Neurologic Examination   See NIHSS  Labs/Imaging/Neurodiagnostic studies   CBC: No results for input(s): WBC, NEUTROABS, HGB, HCT, MCV, PLT in the last 168 hours. Basic Metabolic Panel:  Lab Results  Component Value Date   NA 138 06/09/2024   K 3.2 (L) 06/09/2024   CO2 28 06/09/2024   GLUCOSE 134 (H) 06/09/2024   BUN 10 06/09/2024   CREATININE 1.08 (H) 06/09/2024   CALCIUM  8.7 (L) 06/09/2024   GFRNONAA 53 (L) 06/09/2024   GFRAA 75 06/06/2020   Lipid Panel:  Lab Results  Component Value Date   LDLCALC 54 08/29/2023   HgbA1c:  Lab Results  Component Value Date   HGBA1C 6.3 (H) 04/29/2024   Urine Drug Screen:     Component Value Date/Time   LABOPIA NONE DETECTED 03/21/2024 1800   COCAINSCRNUR NONE DETECTED 03/21/2024 1800   LABBENZ NONE DETECTED 03/21/2024 1800   AMPHETMU NONE DETECTED 03/21/2024 1800   THCU NONE DETECTED 03/21/2024 1800   LABBARB NONE DETECTED 03/21/2024 1800    Alcohol  Level     Component Value Date/Time   ETH <10 12/28/2021 1151   INR  Lab Results  Component Value Date   INR 1.2 12/28/2021   APTT  Lab Results  Component Value Date   APTT 39 (H) 12/28/2021   AED levels:  Lab Results  Component Value Date   PHENYTOIN  13.1 05/22/2009   LAMOTRIGINE  13.0 03/21/2024   LEVETIRACETA 20.8 03/21/2024      ASSESSMENT  78 y.o. female with an extensive cardiac history, seizures (on Lamictal  and Keppra  at home), OSA, CHF, stroke with residual left sided weakness presenting after a syncopal episode. Family denied jerking, eye deviation, incontinence. She is on eliquis  for afib. Endorses taking all of her medications today. Ambulates with a walker. Code Stroke was called for left sided weakness and  aphasia.  - Exam with left sided weakness and dysarthria. NIHSS 6.  - CT head: Old, small, cortically-based right frontal cortical infarct. No acute ischemic changes or dense vessel. - Impression:  - Syncopal spell at home without seizure activity. DDx for underlying etiology includes cardiogenic and neurocardiogenic syncope  - Seizure disorder. On Lamictal  and Keppra  at home - Left sided weakness secondary to prior stroke. Deficits on exam correspond to known chronic left sided weakness.   RECOMMENDATIONS  - Syncope work up [employer] will follow PRN. Please call if there are additional questions.  ______________________________________________________________________   Bonney Jorene Last, NP [facility]  I have seen and examined the patient. I have formulated the assessment and recommendations. 78 year old female with an extensive cardiac history, seizures (on Lamictal  and Keppra  at home), OSA, CHF, stroke with residual left sided weakness presenting after a syncopal episode. Family denied jerking, eye deviation, incontinence. She is on eliquis  for afib. Endorses taking all of her medications today. Ambulates with a walker. Code Stroke was called for left sided weakness and aphasia. Exam with left sided weakness and dysarthria, consistent with her known post-stroke baseline. CT head: Old, small,  cortically-based right frontal cortical infarct. Will need syncope work up.  Electronically signed: Dr. Camellia Shark      [1]  Allergies Allergen Reactions   Other Anaphylaxis and Rash    Bleach Cigarette smoke/ Cough   Penicillins Hives    Did it involve swelling of the face/tongue/throat, SOB, or low BP? No Did it involve sudden or severe rash/hives, skin peeling, or any reaction on the inside of your mouth or nose? Yes Did you need to seek medical attention at a hospital or doctor's office? Yes When did it last happen?      Childhood allergy  If all above  answers are NO, may proceed with cephalosporin use.     Sulfa Antibiotics Hives   Aspirin  Other (See Comments)     On aspirin  81 mg - Rectal bleeding in dec 2018   Codeine     Headache and makes the patient feel off  [2] No current facility-administered medications for this encounter.  Current Outpatient Medications:    acetaminophen  (TYLENOL ) 500 MG tablet, Take 500-1,000 mg by mouth every 4 (four) hours as needed for moderate pain or mild pain., Disp: , Rfl:    albuterol  (VENTOLIN  HFA) 108 (90 Base) MCG/ACT inhaler, Inhale 1-2 puffs into the lungs every 6 (six) hours as needed for wheezing or shortness of breath., Disp: 17 each, Rfl: 5   Alcaftadine (LASTACAFT) 0.25 % SOLN, Place 1-2 drops into both eyes daily as needed (allergy eye/irritation.)., Disp: , Rfl:    Alcohol  Swabs (DROPSAFE ALCOHOL  PREP) 70 % PADS, USE TWO TIMES DAILY AS DIRECTED, Disp: 200 each, Rfl: 3   amiodarone  (PACERONE ) 100 MG tablet, TAKE 1 TABLET EVERY DAY, Disp: 90 tablet, Rfl: 3   Ascorbic Acid (VITAMIN C) 1000 MG tablet, Take 1,000 mg by mouth in the morning., Disp: , Rfl:    atorvastatin  (LIPITOR) 40 MG tablet, TAKE 1 TABLET EVERY DAY, Disp: 90 tablet, Rfl: 3   B-D ULTRAFINE III SHORT PEN 31G X 8 MM MISC, USE DAILY WITH VICTOZA  AND/OR NOVOLOG ., Disp: 400 each, Rfl: 2   benzonatate  (TESSALON ) 100 MG capsule, Take 1 capsule by mouth three times daily as needed for cough, Disp: 30 capsule, Rfl: 0   Blood Glucose Monitoring Suppl (TRUE METRIX AIR GLUCOSE METER) w/Device KIT, USE AS DIRECTED, Disp: 1 kit, Rfl: 3   carvedilol  (COREG ) 12.5 MG tablet, TAKE 1 TABLET TWICE DAILY, Disp: 180 tablet, Rfl: 3   Dasiglucagon  HCl (ZEGALOGUE ) 0.6 MG/0.6ML SOAJ, Inject 0.6 mg into the skin daily as needed (use for extremely low blood sugars, less than 60)., Disp: 0.6 mL, Rfl: 3   diclofenac  sodium (VOLTAREN ) 1 % GEL, Apply 2 g topically 4 (four) times daily as needed (pain)., Disp: , Rfl:    ELIQUIS  5 MG TABS tablet, TAKE 1  TABLET TWICE DAILY, Disp: 180 tablet, Rfl: 3   ferrous sulfate  324 MG TBEC, Take 324 mg by mouth daily., Disp: , Rfl:    furosemide  (LASIX ) 20 MG tablet, Take 4 tablets (80 mg total) by mouth twice a day., Disp: 720 tablet, Rfl: 3   HUMALOG  KWIKPEN 100 UNIT/ML KwikPen, USE AS DIRECTED BY YOUR PRESCRIBER. COMPLETE DIRECTIONS ARE INCLUDED IN A LETTER WITH YOUR ORIGINAL ORDER, Disp: 45 mL, Rfl: 3   hydrALAZINE  (APRESOLINE ) 100 MG tablet, TAKE 1 TABLET THREE TIMES DAILY (DOSE CHANGE, REPLACES PREVIOUS PRESCRIPTIONS), Disp: 270 tablet, Rfl: 3   hydrocortisone cream 1 %, Apply 1 application topically daily as needed for itching. , Disp: , Rfl:  Insulin  Glargine (TOUJEO  SOLOSTAR [place]), Inject 20 Units into the skin at bedtime., Disp: , Rfl:    Isopropyl Alcohol  (ALCOHOL  WIPES) 70 % MISC, Apply 1 each topically 2 (two) times daily., Disp: 300 each, Rfl: 3   isosorbide  mononitrate (IMDUR ) 30 MG 24 hr tablet, TAKE 1 TABLET EVERY DAY, Disp: 30 tablet, Rfl: 0   lamoTRIgine  (LAMICTAL ) 200 MG tablet, Take 1 tablet (200 mg total) by mouth 2 (two) times daily., Disp: 180 tablet, Rfl: 3   levETIRAcetam  (KEPPRA ) 500 MG tablet, Take 2 tablets every night, Disp: 180 tablet, Rfl: 3   loratadine  (CLARITIN ) 10 MG tablet, Take 1 tablet (10 mg total) by mouth daily., Disp: 30 tablet, Rfl: 2   Multiple Vitamin (MULTIVITAMIN) tablet, Take 1 tablet by mouth daily., Disp: , Rfl:    oxyCODONE  (OXY IR/ROXICODONE ) 5 MG immediate release tablet, Take 5 mg by mouth every 6 (six) hours as needed (pain.)., Disp: , Rfl:    OZEMPIC , 1 MG/DOSE, 4 MG/3ML SOPN, INJECT 1MG  UNDER THE SKIN ONE TIME WEEKLY ON WEDNESDAYS, Disp: 9 mL, Rfl: 3   Pancrelipase , Lip-Prot-Amyl, 25000-79000 units CPEP, Take 2 capsules by mouth in the morning, at noon, and at bedtime., Disp: , Rfl:    potassium chloride  SA (KLOR-CON  M) 20 MEQ tablet, Take 2 tablets (40 mEq total) by mouth daily., Disp: 270 tablet, Rfl: 3   sennosides-docusate sodium  (SENOKOT-S) 8.6-50  MG tablet, Take 1-2 tablets by mouth at bedtime as needed for constipation., Disp: , Rfl:    sertraline  (ZOLOFT ) 50 MG tablet, TAKE 1 TABLET EVERY DAY, Disp: 90 tablet, Rfl: 3   spironolactone  (ALDACTONE ) 25 MG tablet, Take 1 tablet (25 mg total) by mouth daily., Disp: 90 tablet, Rfl: 3   tiZANidine  (ZANAFLEX ) 2 MG tablet, TAKE 1 TABLET AT BEDTIME AS NEEDED, Disp: 30 tablet, Rfl: 0   tolnaftate (TINACTIN) 1 % cream, Apply 1 application topically daily as needed (foot fungus). , Disp: , Rfl:    TRUE METRIX BLOOD GLUCOSE TEST test strip, USE AS DIRECTED TO CHECK BLOOD SUGARS 2 TIMES PER DAY, Disp: 200 strip, Rfl: 3   TRUEplus Lancets 33G MISC, TEST BLOOD SUGAR TWICE DAILY AS DIRECTED, Disp: 200 each, Rfl: 3  Facility-Administered Medications Ordered in Other Encounters:    0.9 %  sodium chloride  infusion (Manually program via Guardrails IV Fluids), , Intravenous, Once, [place], Harlene HERO, FNP   0.9 %  sodium chloride  infusion (Manually program via Guardrails IV Fluids), , Intravenous, Once, [place], Harlene HERO, FNP

## 2024-06-15 NOTE — Code Documentation (Signed)
 Stroke Response Nurse Documentation Code Documentation  [name] is a 78 y.o. female arriving to [facility] via [facility] as Code Stroke activation. LKW 1315 where she had a presumed syncopal episode resulting in left weakness and aphasia.    Stroke team met pt at bridge upon arrival. Labs drawn, pt taken to CT. NIH 6, see flowsheets for details. CT completed. No TNK due to Eliquis , last dose today. Exam not consistent with LVO. Care Plan: q2h NIH/VS, NPO until swallow eval. MRI. Bedside handoff with ED RN Brittney.    Tonna Lacks K  Rapid Response RN

## 2024-06-15 NOTE — H&P (Incomplete)
 " History and Physical    RAIZA KIESEL FMW:[id number] DOB: [birth year] DOA: 06/15/2024  PCP: Jarold Medici, MD  Patient coming from: Home  I have personally briefly reviewed patient's old medical records in [facility]  Chief Complaint: Syncope  HPI: ELTA ANGELL is a 78 y.o. female with medical history significant for paroxysmal atrial flutter on Eliquis , chronic HFpEF/RV failure, pulmonary HTN, ASD s/p repair (12/2022), COPD, seizure disorder, history of CVA, IDT2DM, HTN, HLD, CKD stage IIIa, iron  deficiency anemia due to chronic blood loss, history of diverticular bleed, depression, OSA/OHS with inconsistent use of CPAP who presented to the ED for evaluation after a syncopal event.  Patient states that she was sitting down in a chair at home earlier today when she developed central chest pain described as a pressure-like sensation with someone sitting on my chest description.  She says she felt the pain radiating up to her left jaw.  She denied having associated diaphoresis, nausea/vomiting, dyspnea.  She does not recall what happened next but family were present and she reportedly slid out of her chair and became unresponsive for up to 7 minutes.  Afterwards she regained consciousness and rapidly returned to her baseline.  EMS were called and per report she had left-sided facial droop and left-sided weakness that resolved on route to the ED.  Patient states that she is chest pain-free now.  She denies any similar chest pain in the past.  She has not had any recent changes in her medications.  ED Course  Labs/Imaging on admission: I have personally reviewed following labs and imaging studies.  Initial vitals showed BP 146/74, pulse 55, RR 24, temp 99.0 F, SpO2 94% on room air.  Labs showed troponin T 23 > 26, sodium 138, potassium 3.6, bicarb 29, BUN 16, creatinine 1.05, serum glucose 108, WBC 3.6, hemoglobin 8.9, platelets 315, serum ethanol <15.  UA with negative nitrites  and leukocytes.  CT head without contrast showed old right frontal cortical infarct without acute ischemic changes or dense vessel.  Pelvic x-ray showed mild degenerative changes of the left hip.  CTA chest negative for evidence of PE or other acute process.  Emphysema and distended pulmonary trunk suggesting underlying pulmonary artery hypertension noted.  CT cervical spine without contrast showed degenerative changes.  No fracture.  MRI brain without contrast negative for acute intracranial normality.  Old infarct along the right central sulcus noted.  Patient was seen by neurology on arrival.  Left-sided weakness and dysarthria were felt to be consistent with her known posterior baseline.  The hospitalist service was consulted for syncope eval.  Review of Systems: All systems reviewed and are negative except as documented in history of present illness above.   Past Medical History:  Diagnosis Date   Abnormal liver function     in the past.   Anemia    Arthritis    all over   Bruises easily    Cataract    right eye;immature   CHF (congestive heart failure) (HCC)    Chronic back pain    stenosis   Chronic cough    Chronic kidney disease    COPD (chronic obstructive pulmonary disease) (HCC)    COVID    aug/sept 2021   Demand myocardial infarction [facility]) 2012   Demand Infarction in setting of Pancreatitis --> mild Troponin elevation, NON-OBSTRUCTIVE CAD   Depression    takes Abilify  daily as well as Zoloft    Diastolic heart failure 2010   Grade 1  diastolic Dysfunction by Echo    Diverticulosis    DM (diabetes mellitus) (HCC)    takes Victoza  daily as well as Lantus  and Humalog    Empty sella    on MRI in 2009.   Glaucoma    Headache(784.0)    last migraine-4-68yrs ago   History of blood transfusion    no abnormal reaction noted   History of colon polyps    benign   HTN (hypertension)    takes Benicar ,Imdur ,and Bystolic  daily   Hyperlipidemia    takes Lipitor daily    Joint swelling    Nocturia    Obesity hypoventilation syndrome (HCC)    Obstructive sleep apnea 02/2018   Notably improved split-night study with weight loss from 270 (2017) down to 250 pounds (2019).   Pancreatitis    takes Pancrelipase  daily   Parkinson's disease (HCC)    takes Sinemet  daily   Peripheral neuropathy    Pneumonia 2012   Seizures (HCC)    takes Lamictal  daily and Primidone  nightly;last seizure 2wks ago   Urinary urgency    With increased frequency   Varicose veins of both lower extremities with pain    With edema.  Takes daily Lasix     Past Surgical History:  Procedure Laterality Date   ABDOMINAL HYSTERECTOMY     APPENDECTOMY     ATRIAL SEPTAL DEFECT(ASD) CLOSURE N/A 01/02/2023   Procedure: ATRIAL SEPTAL DEFECT(ASD) CLOSURE;  Surgeon: Wonda Sharper, MD;  Location: [facility] INVASIVE CV LAB;  Service: Cardiovascular;  Laterality: N/A;   BACK SURGERY     Cardiac Event Monitor  September-October 2017   Sinus rhythm with occasional PACs and artifact. No arrhythmias besides one short run of tachycardia.   CARDIOVERSION N/A 08/15/2022   Procedure: CARDIOVERSION;  Surgeon: Rolan Ezra RAMAN, MD;  Location: [facility] ENDOSCOPY;  Service: Cardiovascular;  Laterality: N/A;   CARDIOVERSION N/A 02/04/2023   Procedure: CARDIOVERSION;  Surgeon: Rolan Ezra RAMAN, MD;  Location: [facility] INVASIVE CV LAB;  Service: Cardiovascular;  Laterality: N/A;   CHOLECYSTECTOMY     COLONOSCOPY N/A 08/15/2012   Procedure: COLONOSCOPY;  Surgeon: Elsie Cree, MD;  Location: [facility] ENDOSCOPY;  Service: Endoscopy;  Laterality: N/A;   COLONOSCOPY WITH PROPOFOL  Left 06/17/2017   Procedure: COLONOSCOPY WITH PROPOFOL ;  Surgeon: Saintclair Jasper, MD;  Location: [facility] ENDOSCOPY;  Service: Gastroenterology;  Laterality: Left;   COLONOSCOPY WITH PROPOFOL  N/A 04/03/2021   Procedure: COLONOSCOPY WITH PROPOFOL ;  Surgeon: Rosalie Kitchens, MD;  Location: [facility] ENDOSCOPY;  Service: Endoscopy;  Laterality: N/A;   CORONARY CALCIUM  SCORE AND CTA   04/2018   Coronary calcium  score 71.9.  Very large, hyperdynamic LAD wrapping apex giving rise to PDA.  Moderate LAD-diagonal and circumflex plaque -> CT FFR suggested positive findings in distal D1, D2 and distal circumflex.  Referred for cath. ==> FALSE POSITIVE   ESOPHAGOGASTRODUODENOSCOPY     eye cysts Bilateral    LASIK     LEFT HEART CATH AND CORONARY ANGIOGRAPHY N/A 05/16/2018   Procedure: LEFT HEART CATH AND CORONARY ANGIOGRAPHY;  Surgeon: Claudene Victory ORN, MD;  Location: [facility] INVASIVE CV LAB:  Angiographically normal coronary arteries with LVEDP of 21 mmHg.  Large draping hyperdominant LAD that wraps the apex and provides distal half of the PDA.  Relatively small caliber distal Cx -->proxLPDA, non-dom RCA. No Cx or Diag lesions -- FALSE + CT FFR   LEFT HEART CATH AND CORONARY ANGIOGRAPHY  2009/March 2012   2009: (Dr. Hershel) Nonobstructive CAD; 2012: Minimal CAD --> false positive stress test  NM MYOVIEW  LTD  01/2016; 01/2018   a)LOW RISK. No ischemia or infarction;; b) EF 55-60%. NO ST changes.  No ischemia or Infarction. NO significant RV enlargement.  LOW RISK.   POLYPECTOMY  04/03/2021   Procedure: POLYPECTOMY;  Surgeon: Rosalie Kitchens, MD;  Location: [facility] ENDOSCOPY;  Service: Endoscopy;;   Polysomnogram  02/2018   (Dr. Chalice from neurology): Split study was not adequately done due to low AHI.  She slept reclined and had an AHI less than 10, prolonged hypoxemia and no hypercapnia. -->  She has home oxygen  already prescribed, but did not meet criteria for nightly oxygen .  (Was noted that the patient did not cooperate well with study.  Titration was discussed.   PRESSURE SENSOR/CARDIOMEMS N/A 11/09/2019   Procedure: PRESSURE SENSOR/CARDIOMEMS;  Surgeon: Rolan Ezra RAMAN, MD;  Location: [facility] INVASIVE CV LAB;  Service: Cardiovascular;  Laterality: N/A;   RECTAL POLYPECTOMY     RIGHT HEART CATH N/A 09/07/2022   Procedure: RIGHT HEART CATH;  Surgeon: Rolan Ezra RAMAN, MD;  Location: [facility] INVASIVE CV  LAB;  Service: Cardiovascular;  Laterality: N/A;   RIGHT/LEFT HEART CATH AND CORONARY ANGIOGRAPHY N/A 04/06/2019   Procedure: RIGHT/LEFT HEART CATH AND CORONARY ANGIOGRAPHY;  Surgeon: Anner Alm ORN, MD;  Location: [facility] INVASIVE CV LAB; angiographically normal coronary arteries.  Moderately elevated AD LVEDP..  Mild pulmonary hypertension: PA P 56/14 mmHg-mean 31 mmHg.  RAP 10 mmHg.  RV P-EDP 54/4 mmHg - 12 mmHg.  PCWP 11-15 mmHg.  CO-CI: 7.35-3.73.   TEE WITHOUT CARDIOVERSION N/A 08/15/2022   Procedure: TRANSESOPHAGEAL ECHOCARDIOGRAM (TEE);  Surgeon: Rolan Ezra RAMAN, MD;  Location: [facility] ENDOSCOPY;  Service: Cardiovascular;  Laterality: N/A;   TONSILLECTOMY     TRANSTHORACIC ECHOCARDIOGRAM  01/2016; 05/2017   A) Normal LV chamber size with mod LVH pattern. EF 50-55%. Severe LA dilation. Mod RA dilation. PAP elevated at 38 mmHg;; B) Mild concentric LVH.  EF 55-60% & no RWMA.  Grade 2 DD.  Diastolic flattening of the ventricular septum == ? elevated PAP.  Mild aortic valve calcification/sclerosis.  Mod LA dilation.  Mild RV dilation & Mod RA dilation   TRANSTHORACIC ECHOCARDIOGRAM  10/2017; 01/2018   a) EF 55-60%.  No RWMA,  Moderate LA dilation.  Mild LA dilation.  Peak PA pressures ~57 mmHg (moderate);;; b) EF 55-60%.  GRII DD.  Suggestion of RV volume overload with moderate RV dilation.  Severe LA and RA dilation.SABRA  PA pressure ~67%.   TRANSTHORACIC ECHOCARDIOGRAM  01/2019   Normal LV size and function.  EF 50 to 55%.  Impaired relaxation.  RV appears to have moderately reduced function.  Severely enlarged.  Cannot fully assess RV pressures.  Hypermobile interatrial septum.  Right atrium severely dilated.   TUBAL LIGATION     vaginal cyst removed     several times    Social History: Social History[1]  Allergies[2]  Family History  Problem Relation Age of Onset   Allergies Father    Heart disease Father        before 65   Heart failure Father    Hypertension Father    Hyperlipidemia Father     Heart disease Mother    Diabetes Mother    Hypertension Mother    Hyperlipidemia Mother    Hypertension Sister    Heart disease Sister        before 45   Hyperlipidemia Sister    Diabetes Brother    Hypertension Brother    Diabetes Sister  Hypertension Sister    Diabetes Son    Hypertension Son    Cancer Other      Prior to Admission medications  Medication Sig Start Date End Date Taking? Authorizing Provider  acetaminophen  (TYLENOL ) 500 MG tablet Take 500-1,000 mg by mouth every 4 (four) hours as needed for moderate pain or mild pain.    [provider]  albuterol  (VENTOLIN  HFA) 108 (90 Base) MCG/ACT inhaler Inhale 1-2 puffs into the lungs every 6 (six) hours as needed for wheezing or shortness of breath. 05/22/23   Jarold Medici, MD  Alcaftadine (LASTACAFT) 0.25 % SOLN Place 1-2 drops into both eyes daily as needed (allergy eye/irritation.).    [provider]  Alcohol  Swabs (DROPSAFE ALCOHOL  PREP) 70 % PADS USE TWO TIMES DAILY AS DIRECTED 08/20/23   Jarold Medici, MD  amiodarone  (PACERONE ) 100 MG tablet TAKE 1 TABLET EVERY DAY 03/09/24   McLean, Dalton S, MD  Ascorbic Acid (VITAMIN C) 1000 MG tablet Take 1,000 mg by mouth in the morning.    [provider]  atorvastatin  (LIPITOR) 40 MG tablet TAKE 1 TABLET EVERY DAY 09/18/23   Jarold Medici, MD  B-D ULTRAFINE III SHORT PEN 31G X 8 MM MISC USE DAILY WITH VICTOZA  AND/OR NOVOLOG . 09/25/21   Jarold Medici, MD  benzonatate  (TESSALON ) 100 MG capsule Take 1 capsule by mouth three times daily as needed for cough 09/03/23   Jarold Medici, MD  Blood Glucose Monitoring Suppl (TRUE METRIX AIR GLUCOSE METER) w/Device KIT USE AS DIRECTED 05/27/23   Jarold Medici, MD  carvedilol  (COREG ) 12.5 MG tablet TAKE 1 TABLET TWICE DAILY 06/24/23   McLean, Dalton S, MD  Dasiglucagon  HCl (ZEGALOGUE ) 0.6 MG/0.6ML SOAJ Inject 0.6 mg into the skin daily as needed (use for extremely low blood sugars, less than 60). 09/25/23   Jarold Medici, MD  diclofenac  sodium (VOLTAREN ) 1 % GEL Apply 2 g topically 4 (four) times daily as needed (pain). 05/17/18   Vann, Jessica U, DO  ELIQUIS  5 MG TABS tablet TAKE 1 TABLET TWICE DAILY 06/10/24   [place], [place], [place]  ferrous sulfate  324 MG TBEC Take 324 mg by mouth daily.    [provider]  furosemide  (LASIX ) 20 MG tablet Take 4 tablets (80 mg total) by mouth twice a day. 07/22/23   [place], Harlene HERO, FNP  HUMALOG  KWIKPEN 100 UNIT/ML KwikPen USE AS DIRECTED BY YOUR PRESCRIBER. COMPLETE DIRECTIONS ARE INCLUDED IN A LETTER WITH YOUR ORIGINAL ORDER 04/01/24   Jarold Medici, MD  hydrALAZINE  (APRESOLINE ) 100 MG tablet TAKE 1 TABLET THREE TIMES DAILY (DOSE CHANGE, REPLACES PREVIOUS PRESCRIPTIONS) 03/09/24   McLean, Dalton S, MD  hydrocortisone cream 1 % Apply 1 application topically daily as needed for itching.     [provider]  Insulin  Glargine (TOUJEO  SOLOSTAR [place]) Inject 20 Units into the skin at bedtime.    [provider]  Isopropyl Alcohol  (ALCOHOL  WIPES) 70 % MISC Apply 1 each topically 2 (two) times daily. 06/25/19   Moore, Janece, FNP  isosorbide  mononitrate (IMDUR ) 30 MG 24 hr tablet TAKE 1 TABLET EVERY DAY 04/03/24   Anner Alm ORN, MD  lamoTRIgine  (LAMICTAL ) 200 MG tablet Take 1 tablet (200 mg total) by mouth 2 (two) times daily. 11/15/23   Georjean Darice HERO, MD  levETIRAcetam  (KEPPRA ) 500 MG tablet Take 2 tablets every night 11/15/23   Aquino, Karen M, MD  loratadine  (CLARITIN ) 10 MG tablet Take 1 tablet (10 mg total) by mouth daily. 10/01/22 02/06/25  Jarold Medici, MD  Multiple Vitamin (MULTIVITAMIN) tablet Take 1 tablet by mouth daily.    [provider]  oxyCODONE  (OXY IR/ROXICODONE ) 5 MG immediate release tablet Take 5 mg by mouth every 6 (six) hours as needed (pain.). 11/01/22   [provider]  OZEMPIC , 1 MG/DOSE, 4 MG/3ML SOPN INJECT 1MG  UNDER THE SKIN ONE TIME WEEKLY ON [facility] 10/14/23   Jarold Medici, MD  Pancrelipase ,  Lip-Prot-Amyl, 25000-79000 units CPEP Take 2 capsules by mouth in the morning, at noon, and at bedtime.    [provider]  potassium chloride  SA (KLOR-CON  M) 20 MEQ tablet Take 2 tablets (40 mEq total) by mouth daily. 06/09/24   [place], Harlene HERO, FNP  sennosides-docusate sodium  (SENOKOT-S) 8.6-50 MG tablet Take 1-2 tablets by mouth at bedtime as needed for constipation.    [provider]  sertraline  (ZOLOFT ) 50 MG tablet TAKE 1 TABLET EVERY DAY 12/12/23   Jarold Medici, MD  spironolactone  (ALDACTONE ) 25 MG tablet Take 1 tablet (25 mg total) by mouth daily. 06/09/24 09/07/24  Glena Harlene HERO, FNP  tiZANidine  (ZANAFLEX ) 2 MG tablet TAKE 1 TABLET AT BEDTIME AS NEEDED 05/28/24   Jarold Medici, MD  tolnaftate (TINACTIN) 1 % cream Apply 1 application topically daily as needed (foot fungus).     [provider]  TRUE METRIX BLOOD GLUCOSE TEST test strip USE AS DIRECTED TO CHECK BLOOD SUGARS 2 TIMES PER DAY 05/27/23   Jarold Medici, MD  TRUEplus Lancets 33G MISC TEST BLOOD SUGAR TWICE DAILY AS DIRECTED 11/11/21   Jarold Medici, MD    Physical Exam: Vitals:   06/15/24 1630 06/15/24 2030 06/15/24 2230 06/15/24 2313  BP: 130/74 119/61 121/64 121/64  Pulse: 74 86 66 66  Resp: (!) 23 16  18   Temp:  98.7 F (37.1 C)    TempSrc:  Oral    SpO2: 98% 98% 96% 96%  Weight:      Height:       Constitutional: Sitting up on the side of the hallway stretcher.  NAD, calm, comfortable Eyes: EOMI, lids and conjunctivae normal ENMT: Mucous membranes are moist. Posterior pharynx clear of any exudate or lesions.Normal dentition.  Neck: normal, supple, no masses. Respiratory: clear to auscultation bilaterally, no wheezing, no crackles. Normal respiratory effort. No accessory muscle use.  Cardiovascular: Regular rate and rhythm, no murmurs / rubs / gallops. No extremity edema. 2+ pedal pulses. Abdomen: no tenderness, no masses palpated. Musculoskeletal: no clubbing / cyanosis. No  joint deformity upper and lower extremities. Good ROM, no contractures. Normal muscle tone.  Skin: no rashes, lesions, ulcers. No induration Neurologic: Sensation intact. Strength 5/5 in all 4.  Psychiatric: Normal judgment and insight. Alert and oriented x 3. Normal mood.   EKG: Personally reviewed. Sinus rhythm, rate 56, no acute ischemic changes per  Assessment/Plan Principal Problem:   Syncope Active Problems:   Pulmonary hypertension, unspecified (HCC)   Paroxysmal atrial flutter (HCC)   Hypertensive heart disease with chronic diastolic congestive heart failure (HCC)   Dyslipidemia, goal LDL below 100   Type 2 diabetes mellitus with stage 3 chronic kidney disease (HCC)   Seizure disorder (HCC)   OSA on CPAP   Chronic obstructive pulmonary disease (HCC)   Iron  deficiency anemia secondary to blood loss (chronic)   Moderate recurrent major depression (HCC)   CATELYN FRIEL is a 78 y.o. female with medical history significant for paroxysmal atrial flutter on Eliquis , chronic HFpEF/RV failure, pulmonary HTN, ASD s/p repair (12/2022), COPD, seizure disorder,  history of CVA, IDT2DM, HTN, HLD, CKD stage IIIa, iron  deficiency anemia due to chronic blood loss, history of diverticular bleed, depression, OSA/OHS with inconsistent use of CPAP who is admitted for evaluation after syncopal event.  Assessment and Plan: Syncope: Patient with witnessed syncopal event at home with approximately 7 minutes unresponsive time before regaining consciousness and returning to baseline.  There was no seizure-like activity per ED documentation.  Patient reports that she was experiencing central chest pressure rating up to her left jaw prior to the event.  Currently she is chest pain-free.  No similar symptoms in the past.  Minimal troponin elevation 23 > 26.  EKG without any significant ischemic changes.  Left heart cath 03/2019 showed normal coronaries. - Keep on telemetry, EKG as needed for recurrent chest  pain - Echocardiogram in a.m. - Check orthostatic vitals - Initially presented with left-sided weakness and dysarthria (now resolved) similar to her prior stroke symptoms; seen by neurology, stroke workup including MRI brain negative for acute CVA  Paroxysmal atrial flutter: Sinus rhythm on admission with controlled rate.  Continue Coreg , amiodarone , and Eliquis .  Seizure disorder: Continue Keppra  and Lamictal .  History of CVA Hyperlipidemia: Continue Eliquis  and atorvastatin .  Chronic HFpEF/RV failure Pulmonary HTN ASD s/p repair (12/2022): Appears euvolemic.  Echocardiogram ordered as above.  Continue Coreg , spironolactone , home oral Lasix  80 mg twice daily.  Iron  deficiency anemia due to chronic blood loss History of diverticular GI bleed: Hemoglobin 8.9 on admission, similar to recent baseline.  Patient denies obvious bleeding.  Continue to monitor.  Hypertension: Continue Coreg , Imdur , spironolactone .  CKD stage IIIa: Renal function stable.  Continue to monitor.  COPD: Stable.  Continue albuterol  nebulizer as needed.  Depression: Continue Zoloft .  OSA/OHS: Patient reports inconsistent use with CPAP.  Continue CPAP nightly.   DVT prophylaxis:  apixaban  (ELIQUIS ) tablet 5 mg   Code Status: Full code, confirmed with patient on admission Family Communication: Discussed with patient, she has discussed with family Disposition Plan: From home, dispo pending clinical progress Consults called: Neurology (signed off) Severity of Illness: The appropriate patient status for this patient is OBSERVATION. Observation status is judged to be reasonable and necessary in order to provide the required intensity of service to ensure the patient's safety. The patient's presenting symptoms, physical exam findings, and initial radiographic and laboratory data in the context of their medical condition is felt to place them at decreased risk for further clinical deterioration. Furthermore, it  is anticipated that the patient will be medically stable for discharge from the hospital within 2 midnights of admission.   Jorie Blanch MD [facility]  If 7PM-7AM, please contact night-coverage [email or web address]  06/16/2024, 12:48 AM      [1]  Social History Tobacco Use   Smoking status: Former    Current packs/day: 0.50    Average packs/day: 0.5 packs/day for 25.0 years (12.5 ttl pk-yrs)    Types: Cigarettes   Smokeless tobacco: Never   Tobacco comments:    quit smoking 20+ytrs ago  Vaping Use   Vaping status: Never Used  Substance Use Topics   Alcohol  use: No   Drug use: No  [2]  Allergies Allergen Reactions   Other Anaphylaxis and Rash    Bleach Cigarette smoke/ Cough   Penicillins Hives    Did it involve swelling of the face/tongue/throat, SOB, or low BP? No Did it involve sudden or severe rash/hives, skin peeling, or any reaction on the inside of your mouth or nose? Yes Did you need to  seek medical attention at a hospital or doctor's office? Yes When did it last happen?      Childhood allergy  If all above answers are NO, may proceed with cephalosporin use.     Sulfa Antibiotics Hives   Aspirin  Other (See Comments)     On aspirin  81 mg - Rectal bleeding in dec 2018   Codeine     Headache and makes the patient feel off   "

## 2024-06-15 NOTE — ED Notes (Signed)
 Patient was ambulated to the restroom using a walker. Patient was able to ambulate by her self with walker, I followed. Patient stated she was not having any SOB, CP, or dizziness. Patient then ambulated back to the bed using a walker with no complaints.

## 2024-06-15 NOTE — ED Notes (Signed)
 CCMD Called

## 2024-06-15 NOTE — ED Provider Notes (Signed)
 Patient was signed out to myself at shift change.  It is noted that the patient had a syncopal event earlier today lasting approximately 6 to 7 minutes.  Patient subsequently had associated unilateral weakness as well as dysarthria and stroke alert was called on initial presentation.  The symptoms have completely resolved and no TNKase was warranted.  MRI and CTA of the chest pending at signout. Physical Exam  BP 119/61 (BP Location: Left Arm)   Pulse 86   Temp 98.7 F (37.1 C) (Oral)   Resp 16   Ht 5' 1 (1.549 m)   Wt 78 kg   SpO2 98%   BMI 32.49 kg/m   Physical Exam  Procedures  Procedures  ED Course / MDM    Medical Decision Making Amount and/or Complexity of Data Reviewed Labs: ordered. Radiology: ordered. ECG/medicine tests: ordered.  Risk Prescription drug management. Decision regarding hospitalization.   Patient does remain asymptomatic at this time and does feel better.  Did discuss patient case with the hospitalist Dr. Tobie for admission given her unexplained syncope.  MRI demonstrated no indication for acute CVA.  CT of the chest demonstrated no indication for pulmonary embolus.  Serial troponins have remained stable.  EKG had no acute ischemic changes.  Anemia is at baseline at this time.  Creatinine is at baseline as well.  Patient is in agreement for admission at this time.  She was evaluated by attending physician who is in agreement to plan.       Daralene Lonni BIRCH, PA-C 06/15/24 2132    Darra Fonda MATSU, MD 06/16/24 [phone]

## 2024-06-15 NOTE — ED Triage Notes (Signed)
 Pt BIB [facility] d/t witnessed syncopal episode. Pt was sitting and slid out of chair family states pt was unresponsive for 7 mins. Pt does not recall fainting, upon ems arrival pt had left sided facial drop that resolved en route and left sided weakness.Last known well 1315. Pt c/o chest pain that radiates to her jaw.  Hx seizure 140/80 BP 122 CBG 96% RA 58 HR

## 2024-06-15 NOTE — ED Notes (Signed)
 Patient transported to MRI

## 2024-06-15 NOTE — ED Provider Notes (Signed)
 " [place] EMERGENCY DEPARTMENT AT [facility] Provider Note   CSN: [id number] Arrival date & time: 06/15/24  1407     Patient presents with: Code Stroke   FAREEDAH MAHLER is a 78 y.o. female.  HPI Patient is a 78 year old female presenting ED today for concerns for syncopal episode with a downtime approximately 6-7 minutes that was witnessed by family, additionally noting that she had left-sided deficits and aphasia by EMS who called her a code stroke upon arrival.  No seizure activity was noted.  Was being actively seen by neurology, Dr. Lindzen when evaluating the patient.  Known to have left-sided deficits secondary to previous stroke currently taking Eliquis  and has been faithful with no missed doses.   Currently she is not complaining of any symptoms at this time.  Feeling back to her baseline.  Reporting that she initially had felt some chest pressure and shortness of breath before waking up in the ambulance.  Currently denies headache, blurry vision, vertigo,  Prior to Admission medications  Medication Sig Start Date End Date Taking? Authorizing Provider  acetaminophen  (TYLENOL ) 500 MG tablet Take 500-1,000 mg by mouth every 4 (four) hours as needed for moderate pain or mild pain.    [provider]  albuterol  (VENTOLIN  HFA) 108 (90 Base) MCG/ACT inhaler Inhale 1-2 puffs into the lungs every 6 (six) hours as needed for wheezing or shortness of breath. 05/22/23   Jarold Medici, MD  Alcaftadine (LASTACAFT) 0.25 % SOLN Place 1-2 drops into both eyes daily as needed (allergy eye/irritation.).    [provider]  Alcohol  Swabs (DROPSAFE ALCOHOL  PREP) 70 % PADS USE TWO TIMES DAILY AS DIRECTED 08/20/23   Jarold Medici, MD  amiodarone  (PACERONE ) 100 MG tablet TAKE 1 TABLET EVERY DAY 03/09/24   McLean, Dalton S, MD  Ascorbic Acid (VITAMIN C) 1000 MG tablet Take 1,000 mg by mouth in the morning.    [provider]  atorvastatin  (LIPITOR) 40 MG  tablet TAKE 1 TABLET EVERY DAY 09/18/23   Jarold Medici, MD  B-D ULTRAFINE III SHORT PEN 31G X 8 MM MISC USE DAILY WITH VICTOZA  AND/OR NOVOLOG . 09/25/21   Jarold Medici, MD  benzonatate  (TESSALON ) 100 MG capsule Take 1 capsule by mouth three times daily as needed for cough 09/03/23   Jarold Medici, MD  Blood Glucose Monitoring Suppl (TRUE METRIX AIR GLUCOSE METER) w/Device KIT USE AS DIRECTED 05/27/23   Jarold Medici, MD  carvedilol  (COREG ) 12.5 MG tablet TAKE 1 TABLET TWICE DAILY 06/24/23   McLean, Dalton S, MD  Dasiglucagon  HCl (ZEGALOGUE ) 0.6 MG/0.6ML SOAJ Inject 0.6 mg into the skin daily as needed (use for extremely low blood sugars, less than 60). 09/25/23   Jarold Medici, MD  diclofenac  sodium (VOLTAREN ) 1 % GEL Apply 2 g topically 4 (four) times daily as needed (pain). 05/17/18   Vann, Jessica U, DO  ELIQUIS  5 MG TABS tablet TAKE 1 TABLET TWICE DAILY 06/10/24   [place], [place], [place]  ferrous sulfate  324 MG TBEC Take 324 mg by mouth daily.    [provider]  furosemide  (LASIX ) 20 MG tablet Take 4 tablets (80 mg total) by mouth twice a day. 07/22/23   Milford, Harlene HERO, FNP  HUMALOG  KWIKPEN 100 UNIT/ML KwikPen USE AS DIRECTED BY YOUR PRESCRIBER. COMPLETE DIRECTIONS ARE INCLUDED IN A LETTER WITH YOUR ORIGINAL ORDER 04/01/24   Jarold Medici, MD  hydrALAZINE  (APRESOLINE ) 100 MG tablet TAKE 1 TABLET THREE TIMES DAILY (DOSE CHANGE, REPLACES PREVIOUS PRESCRIPTIONS) 03/09/24  Rolan Ezra RAMAN, MD  hydrocortisone cream 1 % Apply 1 application topically daily as needed for itching.     [provider]  Insulin  Glargine (TOUJEO  SOLOSTAR [place]) Inject 20 Units into the skin at bedtime.    [provider]  Isopropyl Alcohol  (ALCOHOL  WIPES) 70 % MISC Apply 1 each topically 2 (two) times daily. 06/25/19   Moore, Janece, FNP  isosorbide  mononitrate (IMDUR ) 30 MG 24 hr tablet TAKE 1 TABLET EVERY DAY 04/03/24   Anner Alm ORN, MD  lamoTRIgine  (LAMICTAL ) 200 MG tablet Take 1 tablet  (200 mg total) by mouth 2 (two) times daily. 11/15/23   Georjean Darice HERO, MD  levETIRAcetam  (KEPPRA ) 500 MG tablet Take 2 tablets every night 11/15/23   Aquino, Karen M, MD  loratadine  (CLARITIN ) 10 MG tablet Take 1 tablet (10 mg total) by mouth daily. 10/01/22 02/06/25  Jarold Medici, MD  Multiple Vitamin (MULTIVITAMIN) tablet Take 1 tablet by mouth daily.    [provider]  oxyCODONE  (OXY IR/ROXICODONE ) 5 MG immediate release tablet Take 5 mg by mouth every 6 (six) hours as needed (pain.). 11/01/22   [provider]  OZEMPIC , 1 MG/DOSE, 4 MG/3ML SOPN INJECT 1MG  UNDER THE SKIN ONE TIME WEEKLY ON [facility] 10/14/23   Jarold Medici, MD  Pancrelipase , Lip-Prot-Amyl, 25000-79000 units CPEP Take 2 capsules by mouth in the morning, at noon, and at bedtime.    [provider]  potassium chloride  SA (KLOR-CON  M) 20 MEQ tablet Take 2 tablets (40 mEq total) by mouth daily. 06/09/24   Milford, Harlene HERO, FNP  sennosides-docusate sodium  (SENOKOT-S) 8.6-50 MG tablet Take 1-2 tablets by mouth at bedtime as needed for constipation.    [provider]  sertraline  (ZOLOFT ) 50 MG tablet TAKE 1 TABLET EVERY DAY 12/12/23   Jarold Medici, MD  spironolactone  (ALDACTONE ) 25 MG tablet Take 1 tablet (25 mg total) by mouth daily. 06/09/24 09/07/24  Glena Harlene HERO, FNP  tiZANidine  (ZANAFLEX ) 2 MG tablet TAKE 1 TABLET AT BEDTIME AS NEEDED 05/28/24   Jarold Medici, MD  tolnaftate (TINACTIN) 1 % cream Apply 1 application topically daily as needed (foot fungus).     [provider]  TRUE METRIX BLOOD GLUCOSE TEST test strip USE AS DIRECTED TO CHECK BLOOD SUGARS 2 TIMES PER DAY 05/27/23   Jarold Medici, MD  TRUEplus Lancets 33G MISC TEST BLOOD SUGAR TWICE DAILY AS DIRECTED 11/11/21   Jarold Medici, MD    Allergies: Other, Penicillins, Sulfa antibiotics, Aspirin , and Codeine    Review of Systems  Updated Vital Signs BP 128/67   Pulse (!) 57   Temp 99 F (37.2 C) (Oral)   Resp 14    Ht 5' 1 (1.549 m)   Wt 78 kg   SpO2 100%   BMI 32.49 kg/m   Physical Exam Vitals and nursing note reviewed.  Constitutional:      General: She is not in acute distress.    Appearance: Normal appearance. She is not ill-appearing or diaphoretic.  HENT:     Head: Normocephalic and atraumatic.  Eyes:     General: No scleral icterus.       Right eye: No discharge.        Left eye: No discharge.     Extraocular Movements: Extraocular movements intact.     Conjunctiva/sclera: Conjunctivae normal.  Cardiovascular:     Rate and Rhythm: Normal rate and regular rhythm.     Pulses: Normal pulses.     Heart sounds: Normal heart  sounds. No murmur heard.    No friction rub. No gallop.  Pulmonary:     Effort: Pulmonary effort is normal. No respiratory distress.     Breath sounds: No stridor. No wheezing, rhonchi or rales.  Chest:     Chest wall: No tenderness.  Abdominal:     General: Abdomen is flat. There is no distension.     Palpations: Abdomen is soft.     Tenderness: There is no abdominal tenderness. There is no right CVA tenderness, left CVA tenderness, guarding or rebound.  Musculoskeletal:        General: No swelling, deformity or signs of injury.     Cervical back: Normal range of motion. No rigidity.     Right lower leg: No edema.     Left lower leg: No edema.  Skin:    General: Skin is warm and dry.     Findings: No bruising, erythema or lesion.  Neurological:     Mental Status: She is alert and oriented to person, place, and time. Mental status is at baseline.     Comments: No left-sided deficits, no aphasia noted.  Psychiatric:        Mood and Affect: Mood normal.     (all labs ordered are listed, but only abnormal results are displayed) Labs Reviewed  PROTIME-INR - Abnormal; Notable for the following components:      Result Value   Prothrombin Time 16.4 (*)    All other components within normal limits  APTT - Abnormal; Notable for the following components:    aPTT 41 (*)    All other components within normal limits  CBC - Abnormal; Notable for the following components:   WBC 3.6 (*)    Hemoglobin 8.9 (*)    HCT 29.6 (*)    MCV 74.2 (*)    MCH 22.3 (*)    RDW 19.6 (*)    All other components within normal limits  URINALYSIS, ROUTINE W REFLEX MICROSCOPIC - Abnormal; Notable for the following components:   Color, Urine STRAW (*)    Specific Gravity, Urine 1.004 (*)    All other components within normal limits  CBG MONITORING, ED - Abnormal; Notable for the following components:   Glucose-Capillary 106 (*)    All other components within normal limits  I-STAT CHEM 8, ED - Abnormal; Notable for the following components:   Chloride 97 (*)    Creatinine, Ser 1.20 (*)    Glucose, Bld 113 (*)    Calcium , Ion 1.14 (*)    Hemoglobin 10.2 (*)    HCT 30.0 (*)    All other components within normal limits  DIFFERENTIAL  ETHANOL  COMPREHENSIVE METABOLIC PANEL WITH GFR  CBG MONITORING, ED  TROPONIN T, HIGH SENSITIVITY    EKG: None  Radiology: CT HEAD CODE STROKE WO CONTRAST Result Date: 06/15/2024 CLINICAL DATA:  Code stroke.  Neuro deficit, acute stroke suspected EXAM: CT HEAD WITHOUT CONTRAST TECHNIQUE: Contiguous axial images were obtained from the base of the skull through the vertex without intravenous contrast. RADIATION DOSE REDUCTION: This exam was performed according to the departmental dose-optimization program which includes automated exposure control, adjustment of the mA and/or kV according to patient size and/or use of iterative reconstruction technique. COMPARISON:  MRI July 06, 2022 FINDINGS: There is an old small right frontal cortical infarct There is no hemorrhage. No acute ischemic changes. No dense vessel. The ventricles are normal. IMPRESSION: 1. Old right frontal cortical infarct 2. No acute ischemic changes  or dense vessel Electronically Signed   By: Nancyann Burns M.D.   On: 06/15/2024 14:28   Procedures   Medications  Ordered in the ED  sodium chloride  flush (NS) 0.9 % injection 3 mL (3 mLs Intravenous Given 06/15/24 1442)    Medical Decision Making Amount and/or Complexity of Data Reviewed Labs: ordered. Radiology: ordered. ECG/medicine tests: ordered.  This patient is a 78 year old female who presents to the ED for concern of syncopal versus seizure versus CVA, made a code stroke by EMS with left-sided deficits and aphasia.  Reported to have had a syncopal episode where she reported some chest pain and some shortness of breath where she had a downtime of 6 to 7 minutes.  Evaluated by neurology upon arrival with negative CT scan at that time.  Ordered MRI.  On physical exam, patient is in no acute distress, afebrile, alert and orient x 4, speaking in full sentences, nontachypneic, nontachycardic.  LCTAB, RRR, no murmur, notably does have some left-sided deficits which are at baseline.  No visual field deficits.  Chest pain is absent at this time.  With no reproducible chest pain, no lower extremity swelling.  Patient has been faithful with her Eliquis  with no missed doses however considering patient's chest pain shortness of breath and single episode, CT angio scan was done and pending.  Additionally pending troponin and MRI per neurology.  Patient care passed to [facility], PA-C.  Differential diagnoses prior to evaluation: The emergent differential diagnosis includes, but is not limited to, CVA, ACS, PE, vasovagal, AAS, seizure, intracranial bleed, fracture. This is not an exhaustive differential.   Past Medical History / Co-morbidities / Social History: HTN, seizure, pulmonary pretension, CHF, CVA, without side deficits, CKD, COPD, chronic back pain, diabetes  Additional history: Chart reviewed. Pertinent results include:   Last seen by cardiology on 06/09/2024 noted to have echo 66 5% with additionally noting to be given Keppra  and Lamictal .  Lab Tests/Imaging studies: I personally interpreted  labs/imaging and the pertinent results include:    CBC notes a hemoglobin 8.9 Similar to previous CMP pending UA unremarkable PT elevated at 16.4 with INR normal APTT elevated at 41 Ethanol less than 15 CT head shows old infarct without any acute abnormalities  I agree with the radiologist interpretation.  Cardiac monitoring: EKG obtained and interpreted by myself and attending physician which shows: Sinus rhythm   Medications:  I have reviewed the patients home medicines and have made adjustments as needed.  Critical Interventions: None  Social Determinants of Health: None  Disposition: 3:24 PM Care of Philis Doke transferred to [facility] and Dr. Darra at the end of my shift as the patient will require reassessment once labs/imaging have resulted. Patient presentation, ED course, and plan of care discussed with review of all pertinent labs and imaging. Please see his/her note for further details regarding further ED course and disposition. Plan at time of handoff is Await MRI, CT angio, troponin, anticipate if cleared is able to return home however if any abnormalities showing we likely need admission and cardiology consult. This may be altered or completely changed at the discretion of the oncoming team pending results of further workup.    Final diagnoses:  Syncope, unspecified syncope type    ED Discharge Orders     None          Beola Terrall GORMAN DEVONNA 06/15/24 1528    Levander Houston, MD 06/22/24 1237  "

## 2024-06-16 ENCOUNTER — Encounter (HOSPITAL_COMMUNITY): Payer: Self-pay | Admitting: Internal Medicine

## 2024-06-16 ENCOUNTER — Observation Stay (HOSPITAL_COMMUNITY)

## 2024-06-16 DIAGNOSIS — R55 Syncope and collapse: Secondary | ICD-10-CM | POA: Diagnosis not present

## 2024-06-16 DIAGNOSIS — R569 Unspecified convulsions: Secondary | ICD-10-CM

## 2024-06-16 DIAGNOSIS — R079 Chest pain, unspecified: Secondary | ICD-10-CM

## 2024-06-16 DIAGNOSIS — I4892 Unspecified atrial flutter: Secondary | ICD-10-CM | POA: Diagnosis present

## 2024-06-16 LAB — CBC
HCT: 29.2 % — ABNORMAL LOW (ref 36.0–46.0)
Hemoglobin: 8.6 g/dL — ABNORMAL LOW (ref 12.0–15.0)
MCH: 22.2 pg — ABNORMAL LOW (ref 26.0–34.0)
MCHC: 29.5 g/dL — ABNORMAL LOW (ref 30.0–36.0)
MCV: 75.3 fL — ABNORMAL LOW (ref 80.0–100.0)
Platelets: 318 K/uL (ref 150–400)
RBC: 3.88 MIL/uL (ref 3.87–5.11)
RDW: 19.6 % — ABNORMAL HIGH (ref 11.5–15.5)
WBC: 2.9 K/uL — ABNORMAL LOW (ref 4.0–10.5)
nRBC: 0 % (ref 0.0–0.2)

## 2024-06-16 LAB — BASIC METABOLIC PANEL WITH GFR
Anion gap: 8 (ref 5–15)
BUN: 16 mg/dL (ref 8–23)
CO2: 28 mmol/L (ref 22–32)
Calcium: 8.4 mg/dL — ABNORMAL LOW (ref 8.9–10.3)
Chloride: 103 mmol/L (ref 98–111)
Creatinine, Ser: 1.18 mg/dL — ABNORMAL HIGH (ref 0.44–1.00)
GFR, Estimated: 48 mL/min — ABNORMAL LOW
Glucose, Bld: 241 mg/dL — ABNORMAL HIGH (ref 70–99)
Potassium: 3.6 mmol/L (ref 3.5–5.1)
Sodium: 139 mmol/L (ref 135–145)

## 2024-06-16 LAB — CBG MONITORING, ED: Glucose-Capillary: 112 mg/dL — ABNORMAL HIGH (ref 70–99)

## 2024-06-16 MED ORDER — SODIUM CHLORIDE 0.9 % IV SOLN: INTRAVENOUS | Status: AC

## 2024-06-16 NOTE — Progress Notes (Signed)
 PT currently with patient will check back when schedule permits for EEG.

## 2024-06-16 NOTE — Care Management Obs Status (Signed)
 MEDICARE OBSERVATION STATUS NOTIFICATION   Patient Details  Name: [name] MRN: [id number] Date of Birth: 1946/02/17   Medicare Observation Status Notification Given:  Yes    Merilee LOISE Batty, RN 06/16/2024, 3:18 PM

## 2024-06-16 NOTE — Progress Notes (Signed)
 OT Cancellation Note  Patient Details Name: INESS PANGILINAN MRN: [id number] DOB: 19-Jan-1946   Cancelled Treatment:    Reason Eval/Treat Not Completed: OT screened, no needs identified, will sign off.  Per discussion with PT patient is at her baseline level of function modified independent with use of a cane PRN at home.  Will defer OT eval at this time with no further recommendations.  Leonore Frankson OTR/L 06/16/2024, 12:50 PM

## 2024-06-16 NOTE — Hospital Course (Signed)
 [name] is a 78 y.o. female with medical history significant for paroxysmal atrial flutter on Eliquis , chronic HFpEF/RV failure, pulmonary HTN, ASD s/p repair (12/2022), COPD, seizure disorder, history of CVA, IDT2DM, HTN, HLD, CKD stage IIIa, iron  deficiency anemia due to chronic blood loss, history of diverticular bleed, depression, OSA/OHS with inconsistent use of CPAP who is admitted for evaluation after syncopal event.

## 2024-06-16 NOTE — Progress Notes (Signed)
 " PROGRESS NOTE    [name]  FMW:[id number] DOB: [birth year] DOA: 06/15/2024 PCP: Jarold Medici, MD   Brief Narrative:  HPI: [name] is a 78 y.o. female with medical history significant for paroxysmal atrial flutter on Eliquis , chronic HFpEF/RV failure, pulmonary HTN, ASD s/p repair (12/2022), COPD, seizure disorder, history of CVA, IDT2DM, HTN, HLD, CKD stage IIIa, iron  deficiency anemia due to chronic blood loss, history of diverticular bleed, depression, OSA/OHS with inconsistent use of CPAP who presented to the ED for evaluation after a syncopal event.   Patient states that she was sitting down in a chair at home earlier today when she developed central chest pain described as a pressure-like sensation with someone sitting on my chest description.  She says she felt the pain radiating up to her left jaw.  She denied having associated diaphoresis, nausea/vomiting, dyspnea.  She does not recall what happened next but family were present and she reportedly slid out of her chair and became unresponsive for up to 7 minutes.  Afterwards she regained consciousness and rapidly returned to her baseline.  EMS were called and per report she had left-sided facial droop and left-sided weakness that resolved on route to the ED.   Patient states that she is chest pain-free now.  She denies any similar chest pain in the past.  She has not had any recent changes in her medications.   ED Course  Labs/Imaging on admission: I have personally reviewed following labs and imaging studies.   Initial vitals showed BP 146/74, pulse 55, RR 24, temp 99.0 F, SpO2 94% on room air.   Labs showed troponin T 23 > 26, sodium 138, potassium 3.6, bicarb 29, BUN 16, creatinine 1.05, serum glucose 108, WBC 3.6, hemoglobin 8.9, platelets 315, serum ethanol <15.   UA with negative nitrites and leukocytes.   CT head without contrast showed old right frontal cortical infarct without acute ischemic changes or  dense vessel.   Pelvic x-ray showed mild degenerative changes of the left hip.   CTA chest negative for evidence of PE or other acute process.  Emphysema and distended pulmonary trunk suggesting underlying pulmonary artery hypertension noted.   CT cervical spine without contrast showed degenerative changes.  No fracture.   MRI brain without contrast negative for acute intracranial normality.  Old infarct along the right central sulcus noted.   Patient was seen by neurology on arrival.  Left-sided weakness and dysarthria were felt to be consistent with her known posterior baseline.  The hospitalist service was consulted for syncope eval.  Assessment & Plan:   Principal Problem:   Syncope Active Problems:   Pulmonary hypertension, unspecified (HCC)   Paroxysmal atrial flutter (HCC)   Hypertensive heart disease with chronic diastolic congestive heart failure (HCC)   Dyslipidemia, goal LDL below 100   Type 2 diabetes mellitus with stage 3 chronic kidney disease (HCC)   Seizure disorder (HCC)   OSA on CPAP   Chronic obstructive pulmonary disease (HCC)   Iron  deficiency anemia secondary to blood loss (chronic)   Moderate recurrent major depression (HCC)  Syncope: Patient with witnessed syncopal event at home with approximately 7 minutes unresponsive time before regaining consciousness and returning to baseline.  There was no seizure-like activity per ED documentation.  Patient reports that she was experiencing central chest pressure rating up to her left jaw prior to the event.  Currently she is chest pain-free.  No similar symptoms in the past.  Minimal troponin elevation 23 >  26.  EKG without any significant ischemic changes.  Left heart cath 03/2019 showed normal coronaries.  No events noted on the telemetry, she is symptom-free and echo is pending.  PT OT assessment is pending as well.  Strokelike symptoms: Initially presented with left-sided weakness and dysarthria (now resolved)  similar to her prior stroke symptoms; seen by neurology, stroke workup including MRI brain negative for acute CVA.   Paroxysmal atrial flutter: Remains in sinus rhythm with controlled rate.  Continue Coreg , amiodarone , and Eliquis .   Seizure disorder: Continue Keppra  and Lamictal .  Her syncope could very well be due to seizure disorder.  Warrants slight more observation.  I will repeat EEG.   History of CVA Hyperlipidemia: Continue Eliquis  and atorvastatin .   Chronic HFpEF/RV failure Pulmonary HTN ASD s/p repair (12/2022): Slightly dehydrated.  Echocardiogram ordered as above.  Continue Coreg , hold Lasix  and Aldactone .  Will provide gentle IV hydration of 500 cc.   Iron  deficiency anemia due to chronic blood loss History of diverticular GI bleed: Hemoglobin 8.9 on admission, similar to recent baseline.  Patient denies obvious bleeding.  Continue to monitor.  Receives outpatient iron  therapy.   Hypertension: Continue Coreg , Imdur , spironolactone .  Blood pressure controlled.   CKD stage IIIa: Baseline creatinine has a wide mostly hovering between 1-1.1, currently 1.18 which is slightly higher than her baseline.  Patient on Lasix  80 mg p.o. twice daily and Aldactone .  She has already received morning dose of both of them, will discontinue both of them now.  Repeat labs in the morning.   COPD: Stable.  Continue albuterol  nebulizer as needed.   Depression: Continue Zoloft .   OSA/OHS: Patient reports inconsistent use with CPAP.  Continue CPAP nightly.  DVT prophylaxis: Eliquis    Code Status: Full Code  Family Communication:  None present at bedside.  Plan of care discussed with patient in length and he/she verbalized understanding and agreed with it.  Status is: Observation The patient will require care spanning > 2 midnights and should be moved to inpatient because: Needs PT OT assessment, echo and some IV fluids.   Estimated body mass index is 32.49 kg/m as calculated from the  following:   Height as of this encounter: 5' 1 (1.549 m).   Weight as of this encounter: 78 kg.    Nutritional Assessment: Body mass index is 32.49 kg/m.SABRA Seen by dietician.  I agree with the assessment and plan as outlined below: Nutrition Status:        . Skin Assessment: I have examined the patient's skin and I agree with the wound assessment as performed by the wound care RN as outlined below:    Consultants:  Neurology-signed off  Procedures:  None  Antimicrobials:  Anti-infectives (From admission, onward)    None         Subjective: Patient seen and examined, she is sitting at the edge of the bed.  She has no complaints.  She is eager to go home and take care of her dog.  However she understands that she may need to stay overnight.  She is okay with that.  Objective: Vitals:   06/16/24 0802 06/16/24 0808 06/16/24 1000 06/16/24 1005  BP:  (!) 123/90 (!) 127/90   Pulse:  81 64   Resp:  16 17   Temp:    98.5 F (36.9 C)  TempSrc:    Oral  SpO2: 98% 98% 99%   Weight:      Height:  Intake/Output Summary (Last 24 hours) at 06/16/2024 1056 Last data filed at 06/16/2024 1009 Gross per 24 hour  Intake 386 ml  Output 400 ml  Net -14 ml   Filed Weights   06/15/24 1413 06/15/24 1439 06/15/24 2228  Weight: 78.8 kg 78 kg 78 kg    Examination:  General exam: Appears calm and comfortable  Respiratory system: Clear to auscultation. Respiratory effort normal. Cardiovascular system: S1 & S2 heard, RRR. No JVD, murmurs, rubs, gallops or clicks. No pedal edema. Gastrointestinal system: Abdomen is nondistended, soft and nontender. No organomegaly or masses felt. Normal bowel sounds heard. Central nervous system: Alert and oriented. No focal neurological deficits. Extremities: Symmetric 5 x 5 power. Skin: No rashes, lesions or ulcers Psychiatry: Judgement and insight appear normal. Mood & affect appropriate.    Data Reviewed: I have personally reviewed  following labs and imaging studies  CBC: Recent Labs  Lab 06/15/24 1414 06/16/24 0458  WBC 3.6* 2.9*  NEUTROABS 1.7  --   HGB 8.9*  10.2* 8.6*  HCT 29.6*  30.0* 29.2*  MCV 74.2* 75.3*  PLT 315 318   Basic Metabolic Panel: Recent Labs  Lab 06/09/24 1213 06/15/24 1414 06/16/24 0458  NA 138 138  139 139  K 3.2* 3.6  3.6 3.6  CL 101 98  97* 103  CO2 28 29 28   GLUCOSE 134* 108*  113* 241*  BUN 10 16  17 16   CREATININE 1.08* 1.05*  1.20* 1.18*  CALCIUM  8.7* 9.2 8.4*   GFR: Estimated Creatinine Clearance: 38.4 mL/min (A) (by C-G formula based on SCr of 1.18 mg/dL (H)). Liver Function Tests: Recent Labs  Lab 06/15/24 1414  AST 33  ALT 25  ALKPHOS 139*  BILITOT 0.3  PROT 8.2*  ALBUMIN  4.2   No results for input(s): LIPASE, AMYLASE in the last 168 hours. No results for input(s): AMMONIA in the last 168 hours. Coagulation Profile: Recent Labs  Lab 06/15/24 1414  INR 1.2   Cardiac Enzymes: No results for input(s): CKTOTAL, CKMB, CKMBINDEX, TROPONINI in the last 168 hours. BNP (last 3 results) Recent Labs    06/09/24 1213  PROBNP 145.0   HbA1C: No results for input(s): HGBA1C in the last 72 hours. CBG: Recent Labs  Lab 06/15/24 1409 06/16/24 0750  GLUCAP 106* 112*   Lipid Profile: No results for input(s): CHOL, HDL, LDLCALC, TRIG, CHOLHDL, LDLDIRECT in the last 72 hours. Thyroid  Function Tests: No results for input(s): TSH, T4TOTAL, FREET4, T3FREE, THYROIDAB in the last 72 hours. Anemia Panel: No results for input(s): VITAMINB12, FOLATE, FERRITIN, TIBC, IRON , RETICCTPCT in the last 72 hours. Sepsis Labs: No results for input(s): PROCALCITON, LATICACIDVEN in the last 168 hours.  No results found for this or any previous visit (from the past 240 hours).   Radiology Studies: X-ray chest PA and lateral Result Date: 06/15/2024 CLINICAL DATA:  Chest pain.  Syncope. EXAM: CHEST - 2 VIEW  COMPARISON:  Chest CTA earlier today FINDINGS: Mild cardiomegaly. Suspected CardioMEMS at the left lung base. No focal airspace disease, large pleural effusion, pneumothorax or pulmonary edema. No acute osseous findings. IMPRESSION: Mild cardiomegaly. Electronically Signed   By: Andrea Gasman M.D.   On: 06/15/2024 23:00   MR BRAIN WO CONTRAST Result Date: 06/15/2024 EXAM: MRI BRAIN WITHOUT CONTRAST 06/15/2024 06:46:21 PM TECHNIQUE: Multiplanar multisequence MRI of the head/brain was performed without the administration of intravenous contrast. COMPARISON: MRI brain 07/06/2022. CLINICAL HISTORY: Neuro deficit, acute, stroke suspected. FINDINGS: BRAIN AND VENTRICLES: Old infarct along the  right central sulcus. Stable background of mild chronic small vessel disease. No acute infarct. No intracranial hemorrhage. No mass. No midline shift. No hydrocephalus. The sella is unremarkable. Normal flow voids. ORBITS: No acute abnormality. SINUSES AND MASTOIDS: No acute abnormality. BONES AND SOFT TISSUES: Normal marrow signal. No acute soft tissue abnormality. IMPRESSION: 1. No acute intracranial abnormality. 2. Old infarct along the right central sulcus. Electronically signed by: Ryan Chess MD 06/15/2024 06:59 PM EST RP Workstation: [id number]   CT Angio Chest PE W and/or Wo Contrast Result Date: 06/15/2024 CLINICAL DATA:  Pulmonary embolism suspected, high probability. Syncopal episode. EXAM: CT ANGIOGRAPHY CHEST WITH CONTRAST TECHNIQUE: Multidetector CT imaging of the chest was performed using the standard protocol during bolus administration of intravenous contrast. Multiplanar CT image reconstructions and MIPs were obtained to evaluate the vascular anatomy. RADIATION DOSE REDUCTION: This exam was performed according to the departmental dose-optimization program which includes automated exposure control, adjustment of the mA and/or kV according to patient size and/or use of iterative reconstruction technique.  CONTRAST:  75mL OMNIPAQUE  IOHEXOL  350 MG/ML SOLN COMPARISON:  11/17/2020. FINDINGS: Cardiovascular: The heart is enlarged and there is no pericardial effusion. Scattered coronary artery calcifications are noted. An occlusion device is noted between the right atrium and left ventricle. There is atherosclerotic calcification of the aorta without evidence of aneurysm. The pulmonary trunk is mildly distended which may be associated with underlying pulmonary artery hypertension. No pulmonary embolism is seen. Mediastinum/Nodes: No mediastinal, hilar, or axillary lymphadenopathy is seen. The thyroid  gland, trachea, and esophagus are within normal limits. Lungs/Pleura: Paraseptal and centrilobular emphysematous changes are present. Atelectasis is noted bilaterally. No consolidation, effusion, or pneumothorax is seen. A stable thickening is noted along the fissure on the left. No discrete nodule is identified. Upper Abdomen: The gallbladder is surgically absent. No acute abnormality. Musculoskeletal: Degenerative changes are noted in the thoracic spine. No acute osseous abnormality. Review of the MIP images confirms the above findings. IMPRESSION: 1. No evidence of pulmonary embolism or other acute process. 2. Emphysema. 3. Distended pulmonary trunk suggesting underlying pulmonary artery hypertension. 4. Coronary artery calcification. 5. Aortic atherosclerosis. Electronically Signed   By: Leita Birmingham M.D.   On: 06/15/2024 16:46   DG HIP UNILAT WITH PELVIS 2-3 VIEWS LEFT Result Date: 06/15/2024 CLINICAL DATA:  Left hip pain. EXAM: DG HIP (WITH OR WITHOUT PELVIS) 2-3V LEFT COMPARISON:  None Available. FINDINGS: There is no evidence of an acute hip fracture or dislocation. Mild degenerative changes are seen in the form of joint space narrowing and acetabular sclerosis. Postoperative changes are present within the lower lumbar spine and sacrum. IMPRESSION: Mild degenerative changes in the left hip. Electronically Signed    By: Suzen Dials M.D.   On: 06/15/2024 16:29   CT Cervical Spine Wo Contrast Result Date: 06/15/2024 CLINICAL DATA:  Fall EXAM: CT CERVICAL SPINE WITHOUT CONTRAST TECHNIQUE: Multidetector CT imaging of the cervical spine was performed without intravenous contrast. Multiplanar CT image reconstructions were also generated. RADIATION DOSE REDUCTION: This exam was performed according to the departmental dose-optimization program which includes automated exposure control, adjustment of the mA and/or kV according to patient size and/or use of iterative reconstruction technique. COMPARISON:  None Available. FINDINGS: No significant alignment abnormality. The craniocervical junction is normal. The atlantoaxial articulation is normal. The dens is normal. There is no fracture identified. Other comments: There is severe erosive facet arthropathy on the right at C2-3. Multilevel degenerative disc disease IMPRESSION: Degenerative changes.  No fracture Electronically Signed   By:  Nancyann Burns M.D.   On: 06/15/2024 16:18   CT HEAD CODE STROKE WO CONTRAST Result Date: 06/15/2024 CLINICAL DATA:  Code stroke.  Neuro deficit, acute stroke suspected EXAM: CT HEAD WITHOUT CONTRAST TECHNIQUE: Contiguous axial images were obtained from the base of the skull through the vertex without intravenous contrast. RADIATION DOSE REDUCTION: This exam was performed according to the departmental dose-optimization program which includes automated exposure control, adjustment of the mA and/or kV according to patient size and/or use of iterative reconstruction technique. COMPARISON:  MRI July 06, 2022 FINDINGS: There is an old small right frontal cortical infarct There is no hemorrhage. No acute ischemic changes. No dense vessel. The ventricles are normal. IMPRESSION: 1. Old right frontal cortical infarct 2. No acute ischemic changes or dense vessel Electronically Signed   By: Nancyann Burns M.D.   On: 06/15/2024 14:28    Scheduled  Meds:  amiodarone   100 mg Oral Daily   apixaban   5 mg Oral BID   atorvastatin   40 mg Oral Daily   carvedilol   12.5 mg Oral BID WC   furosemide   80 mg Oral BID   isosorbide  mononitrate  30 mg Oral Daily   lamoTRIgine   200 mg Oral BID   levETIRAcetam   500 mg Oral BID   sertraline   50 mg Oral Daily   sodium chloride  flush  3 mL Intravenous Q12H   spironolactone   25 mg Oral Daily   Continuous Infusions:   LOS: 0 days   Fredia Skeeter, MD [facility]  06/16/2024, 10:56 AM   *Please note that this is a verbal dictation therefore any spelling or grammatical errors are due to the Dragon Medical One system interpretation.  Please page via Amion and do not message via secure chat for urgent patient care matters. Secure chat can be used for non urgent patient care matters.  How to contact the [facility] Attending or Consulting provider 7A - 7P or covering provider during after hours 7P -7A, for this patient?  Check the care team in [facility] and look for a) attending/consulting [facility] provider listed and b) the [facility] team listed. Page or secure chat 7A-7P. Log into [email or web address] and use [employer]'s universal password to access. If you do not have the password, please contact the hospital operator. Locate the [facility] provider you are looking for under [facility] and page to a number that you can be directly reached. If you still have difficulty reaching the provider, please page the [facility] (Director on Call) for the Hospitalists listed on amion for assistance.  "

## 2024-06-16 NOTE — Evaluation (Signed)
 Physical Therapy Evaluation Patient Details Name: [name] MRN: [id number] DOB: 10/01/1945 Today's Date: 06/16/2024  History of Present Illness  78 y.o. female presents to [facility] hospital on 06/15/2024 after chest pain followed by a syncopal event. Imaging negative for acute findings. PMH includes PAF, HFpEF, RV failre, PAH, COPD,seizure, CVA, DMII, HTN, HLD, CKD III, depression.  Clinical Impression  Pt presents to PT at or near functional baseline. Pt is able to ambulate for household distances without PT assistance and without DME, although she does typically utilize a SPC to improve stability. Pt reports no current deficits compared to baseline at this time and is eager to discharge home. PT recommends discharge home when medically appropriate, no PT or DME needs. Acute PT signing off.        If plan is discharge home, recommend the following: Assist for transportation   Can travel by private vehicle        Equipment Recommendations None recommended by PT  Recommendations for Other Services       Functional Status Assessment Patient has not had a recent decline in their functional status     Precautions / Restrictions Precautions Precautions: Fall Recall of Precautions/Restrictions: Intact Restrictions Weight Bearing Restrictions Per Provider Order: No      Mobility  Bed Mobility Overal bed mobility: Modified Independent                  Transfers Overall transfer level: Independent Equipment used: None                    Ambulation/Gait Ambulation/Gait assistance: Modified independent (Device/Increase time) Gait Distance (Feet): 120 Feet Assistive device: None Gait Pattern/deviations: Step-through pattern Gait velocity: reduced Gait velocity interpretation: <1.8 ft/sec, indicate of risk for recurrent falls   General Gait Details: slowed step-through gait, reduced foot clearance of LLE  Stairs            Wheelchair Mobility      Tilt Bed    Modified Rankin (Stroke Patients Only)       Balance Overall balance assessment: Needs assistance Sitting-balance support: No upper extremity supported, Feet supported Sitting balance-Leahy Scale: Good     Standing balance support: No upper extremity supported, During functional activity Standing balance-Leahy Scale: Good                               Pertinent Vitals/Pain Pain Assessment Pain Assessment: Faces Faces Pain Scale: Hurts little more Pain Location: feet Pain Descriptors / Indicators: Sore Pain Intervention(s): Monitored during session    Home Living Family/patient expects to be discharged to:: Private residence Living Arrangements: Other relatives Available Help at Discharge: Family;Available PRN/intermittently Type of Home: House Home Access: Ramped entrance       Home Layout: One level Home Equipment: Rollator (4 wheels);Cane - single point;BSC/3in1;Tub bench      Prior Function Prior Level of Function : Independent/Modified Independent             Mobility Comments: ambulatory with SPC typically indoors and rollator outside of the home. ADLs Comments: family typically perform cooking and cleaning although pt reports she is capable if needed, independent with ADLs     Extremity/Trunk Assessment   Upper Extremity Assessment Upper Extremity Assessment: Overall WFL for tasks assessed    Lower Extremity Assessment Lower Extremity Assessment: RLE deficits/detail;LLE deficits/detail RLE Deficits / Details: pt with flat arch bilaterally and chronic ankle inversion, ROM WFL,  strength grossly 4/5 LLE Deficits / Details: pt with flat arch bilaterally and chronic ankle inversion, ROM WFL, strength grossly 4/5    Cervical / Trunk Assessment Cervical / Trunk Assessment: Normal  Communication   Communication Communication: Impaired Factors Affecting Communication: Reduced clarity of speech    Cognition Arousal:  Alert Behavior During Therapy: WFL for tasks assessed/performed   PT - Cognitive impairments: No apparent impairments                         Following commands: Intact       Cueing Cueing Techniques: Verbal cues     General Comments General comments (skin integrity, edema, etc.): VSS on RA. orthostatic vitals in vitals flowsheet, pt asymptomatic    Exercises     Assessment/Plan    PT Assessment Patient does not need any further PT services  PT Problem List         PT Treatment Interventions      PT Goals (Current goals can be found in the Care Plan section)       Frequency       Co-evaluation               AM-PAC PT 6 Clicks Mobility  Outcome Measure Help needed turning from your back to your side while in a flat bed without using bedrails?: None Help needed moving from lying on your back to sitting on the side of a flat bed without using bedrails?: None Help needed moving to and from a bed to a chair (including a wheelchair)?: None Help needed standing up from a chair using your arms (e.g., wheelchair or bedside chair)?: None Help needed to walk in hospital room?: None Help needed climbing 3-5 steps with a railing? : None 6 Click Score: 24    End of Session Equipment Utilized During Treatment: Gait belt Activity Tolerance: Patient tolerated treatment well Patient left: in bed;with call bell/phone within reach Nurse Communication: Mobility status PT Visit Diagnosis: Other abnormalities of gait and mobility (R26.89)    Time: 8875-8854 PT Time Calculation (min) (ACUTE ONLY): 21 min   Charges:   PT Evaluation $PT Eval Low Complexity: 1 Low   PT General Charges $$ ACUTE PT VISIT: 1 Visit         Bernardino JINNY Ruth, PT, DPT Acute Rehabilitation Office [phone]   Bernardino JINNY Ruth 06/16/2024, 11:52 AM

## 2024-06-16 NOTE — Procedures (Signed)
 Patient Name: [name]  MRN: [id number]  Epilepsy Attending: Arlin MALVA Krebs  Referring Physician/Provider: Vernon Ranks, MD  Date: 06/16/2024 Duration: 26.51 mins  Patient history: 78yo F with syncope. EEG to evaluate for seizure  Level of alertness: Awake, asleep  AEDs during EEG study: LTG, LEV  Technical aspects: This EEG study was done with scalp electrodes positioned according to the 10-20 International system of electrode placement. Electrical activity was reviewed with band pass filter of 1-70Hz , sensitivity of 7 uV/mm, display speed of 57mm/sec with a 60Hz  notched filter applied as appropriate. EEG data were recorded continuously and digitally stored.  Video monitoring was available and reviewed as appropriate.  Description: The posterior dominant rhythm consists of 9 Hz activity of moderate voltage (25-35 uV) seen predominantly in posterior head regions, symmetric and reactive to eye opening and eye closing. Sleep was characterized by vertex waves, sleep spindles (12 to 14 Hz), maximal frontocentral region.  Hyperventilation and photic stimulation were not performed.     IMPRESSION: This study is within normal limits. No seizures or epileptiform discharges were seen throughout the recording.  A normal interictal EEG does not exclude the diagnosis of epilepsy.   Adriel Desrosier O Hannah Strader

## 2024-06-16 NOTE — Progress Notes (Signed)
 EEG complete - results pending

## 2024-06-17 ENCOUNTER — Encounter: Payer: Self-pay | Admitting: Internal Medicine

## 2024-06-17 ENCOUNTER — Other Ambulatory Visit (HOSPITAL_COMMUNITY): Payer: Self-pay

## 2024-06-17 ENCOUNTER — Encounter (HOSPITAL_COMMUNITY): Payer: Self-pay | Admitting: Internal Medicine

## 2024-06-17 DIAGNOSIS — R55 Syncope and collapse: Secondary | ICD-10-CM | POA: Diagnosis not present

## 2024-06-17 LAB — BASIC METABOLIC PANEL WITH GFR
Anion gap: 7 (ref 5–15)
BUN: 16 mg/dL (ref 8–23)
CO2: 30 mmol/L (ref 22–32)
Calcium: 8.8 mg/dL — ABNORMAL LOW (ref 8.9–10.3)
Chloride: 98 mmol/L (ref 98–111)
Creatinine, Ser: 1.05 mg/dL — ABNORMAL HIGH (ref 0.44–1.00)
GFR, Estimated: 55 mL/min — ABNORMAL LOW
Glucose, Bld: 134 mg/dL — ABNORMAL HIGH (ref 70–99)
Potassium: 3.9 mmol/L (ref 3.5–5.1)
Sodium: 135 mmol/L (ref 135–145)

## 2024-06-17 LAB — ECHOCARDIOGRAM COMPLETE
AR max vel: 1.81 cm2
AV Area VTI: 1.79 cm2
AV Area mean vel: 1.56 cm2
AV Mean grad: 4 mmHg
AV Peak grad: 8.1 mmHg
Ao pk vel: 1.42 m/s
Area-P 1/2: 3.37 cm2
Calc EF: 62.9 %
Height: 61 in
S' Lateral: 2.8 cm
Single Plane A2C EF: 58.7 %
Single Plane A4C EF: 66.8 %
Weight: 2751.34 [oz_av]

## 2024-06-17 LAB — GLUCOSE, CAPILLARY: Glucose-Capillary: 144 mg/dL — ABNORMAL HIGH (ref 70–99)

## 2024-06-17 MED ORDER — CARVEDILOL 3.125 MG PO TABS
3.1250 mg | ORAL_TABLET | Freq: Two times a day (BID) | ORAL | Status: DC
  Administered 2024-06-17: 3.125 mg via ORAL
  Filled 2024-06-17: qty 1

## 2024-06-17 MED ORDER — ISOSORBIDE MONONITRATE ER 30 MG PO TB24
15.0000 mg | ORAL_TABLET | Freq: Every day | ORAL | 0 refills | Status: DC
  Filled 2024-06-17: qty 30, 60d supply, fill #0

## 2024-06-17 MED ORDER — CARVEDILOL 3.125 MG PO TABS
3.1250 mg | ORAL_TABLET | Freq: Two times a day (BID) | ORAL | 0 refills | Status: AC
  Filled 2024-06-17: qty 60, 30d supply, fill #0

## 2024-06-17 MED ORDER — HYDRALAZINE HCL 50 MG PO TABS
50.0000 mg | ORAL_TABLET | Freq: Three times a day (TID) | ORAL | 0 refills | Status: AC
  Filled 2024-06-17: qty 90, 30d supply, fill #0

## 2024-06-17 MED ORDER — ISOSORBIDE MONONITRATE ER 30 MG PO TB24
15.0000 mg | ORAL_TABLET | Freq: Every day | ORAL | Status: DC
  Administered 2024-06-17: 15 mg via ORAL
  Filled 2024-06-17: qty 1

## 2024-06-17 NOTE — Plan of Care (Signed)

## 2024-06-17 NOTE — Discharge Summary (Signed)
 Physician Discharge Summary  [name] FMW:[id number] DOB: 1945-07-21 DOA: 06/15/2024  PCP: Jarold Medici, MD  Admit date: 06/15/2024 Discharge date: 06/17/2024    Admitted From: Home Disposition: Home  Recommendations for Outpatient Follow-up:  Follow up with PCP in 1-2 weeks Please obtain BMP/CBC in one week Follow-up with your cardiologist in 1 to 2 weeks Please follow up with your PCP on the following pending results: Unresulted Labs (From admission, onward)    None         Home Health: None none Equipment/Devices: None none  Discharge Condition: Stable CODE STATUS: Full code Diet recommendation:  Diet Order             Diet heart healthy/carb modified Fluid consistency: Thin  Diet effective now                   Subjective: Patient seen and examined, she is sitting at the edge of the bed, she is ready to go home.  She has no complaints.  Brief/Interim Summary: [name] is a 78 y.o. female with medical history significant for paroxysmal atrial flutter on Eliquis , chronic HFpEF/RV failure, pulmonary HTN, ASD s/p repair (12/2022), COPD, seizure disorder, history of CVA, IDT2DM, HTN, HLD, CKD stage IIIa, iron  deficiency anemia due to chronic blood loss, history of diverticular bleed, depression, OSA/OHS with inconsistent use of CPAP who presented to the ED for evaluation after a syncopal event.   Patient states that she was sitting down in a chair at home earlier today when she developed central chest pain described as a pressure-like sensation with someone sitting on my chest description.  She says she felt the pain radiating up to her left jaw.  She denied having associated diaphoresis, nausea/vomiting, dyspnea.  She does not recall what happened next but family were present and she reportedly slid out of her chair and became unresponsive for up to 7 minutes.  Afterwards she regained consciousness and rapidly returned to her baseline.  EMS were called  and per report she had left-sided facial droop and left-sided weakness that resolved on route to the ED.  Patient was chest pain-free when seen by hospital service. Labs showed troponin T 23 > 26, sodium 138, potassium 3.6, bicarb 29, BUN 16, creatinine 1.05, serum glucose 108, WBC 3.6, hemoglobin 8.9, platelets 315, serum ethanol <15. UA with negative nitrites and leukocytes. CT head without contrast showed old right frontal cortical infarct without acute ischemic changes or dense vessel. Pelvic x-ray showed mild degenerative changes of the left hip.CTA chest negative for evidence of PE or other acute process.  Emphysema and distended pulmonary trunk suggesting underlying pulmonary artery hypertension noted. CT cervical spine without contrast showed degenerative changes.  No fracture. MRI brain without contrast negative for acute intracranial normality.  Old infarct along the right central sulcus noted. Patient was seen by neurology on arrival.  Left-sided weakness and dysarthria were felt to be consistent with her known posterior baseline.  The hospitalist service was consulted for syncope eval. details below.    Syncope/chest pain/hypertension: There was no seizure-like activity per ED documentation.  Patient reports that she was experiencing central chest pressure rating up to her left jaw prior to the event.  Currently she is chest pain-free.  No similar symptoms in the past.  Minimal troponin elevation 23 > 26.  EKG without any significant ischemic changes.  Left heart cath 03/2019 showed normal coronaries.  No events noted on the telemetry, she is symptom-free since arrival to  ED, echo ruled out wall motion abnormality.  Seen by PT OT, she is at baseline, they do not recommend any PT OT to go home with.  She is on several antihypertensive medications.  We resumed her Coreg  which was 12.5 p.o. twice daily, Imdur  30 mg however we held her hydralazine  and Aldactone .  Despite of this, patient's blood  pressure remains on the low normal side.  Based on this, we decided to discharge at a lower dose of Coreg  3.125, lower dose of Imdur  15 mg, lower dose of hydralazine  50 mg p.o. 3 times daily (previously on 100 mg p.o. 3 times daily) and resumed previous dose of Aldactone  and Lasix .   Strokelike symptoms: Initially presented with left-sided weakness and dysarthria (now resolved) similar to her prior stroke symptoms; seen by neurology, stroke workup including MRI brain negative for acute CVA.   Paroxysmal atrial flutter: Remains in sinus rhythm with controlled rate.  Continue Coreg , amiodarone , and Eliquis .  However reduced the dose of Coreg  to 3.125.   Seizure disorder: Continue Keppra  and Lamictal .  Seizure was considered to be one of the possible cause of her syncope so we repeated EEG which was unremarkable/negative for epileptiform activity.   History of CVA Hyperlipidemia: Continue Eliquis  and atorvastatin .   Chronic HFpEF/RV failure Pulmonary HTN ASD s/p repair (12/2022): Resume home dose of Lasix .  Appeared euvolemic.   Iron  deficiency anemia due to chronic blood loss History of diverticular GI bleed: Hemoglobin 8.9 on admission, similar to recent baseline.  Patient denies obvious bleeding.     CKD stage IIIa: Baseline creatinine has a wide mostly hovering between 1-1.1, it jumped to 1.18 but improved to 1.05 today without any hydration.  This is her baseline.   COPD: Stable.  Continue albuterol  nebulizer as needed.   Depression: Continue Zoloft .   OSA/OHS: Patient reports inconsistent use with CPAP.  Continue CPAP nightly.  Discharge plan was discussed with patient and/or family member and they verbalized understanding and agreed with it.  Discharge Diagnoses:  Principal Problem:   Syncope Active Problems:   Pulmonary hypertension, unspecified (HCC)   Paroxysmal atrial flutter (HCC)   Hypertensive heart disease with chronic diastolic congestive heart failure (HCC)    Dyslipidemia, goal LDL below 100   Type 2 diabetes mellitus with stage 3 chronic kidney disease (HCC)   Seizure disorder (HCC)   OSA on CPAP   Chronic obstructive pulmonary disease (HCC)   Iron  deficiency anemia secondary to blood loss (chronic)   Moderate recurrent major depression (HCC)    Discharge Instructions   Allergies as of 06/17/2024       Reactions   Other Anaphylaxis, Rash   Bleach Cigarette smoke/ Cough   Penicillins Hives   Did it involve swelling of the face/tongue/throat, SOB, or low BP? No Did it involve sudden or severe rash/hives, skin peeling, or any reaction on the inside of your mouth or nose? Yes Did you need to seek medical attention at a hospital or doctor's office? Yes When did it last happen?      Childhood allergy  If all above answers are NO, may proceed with cephalosporin use.   Sulfa Antibiotics Hives   Aspirin  Other (See Comments)    On aspirin  81 mg - Rectal bleeding in dec 2018   Codeine    Headache and makes the patient feel off        Medication List     TAKE these medications    acetaminophen  500 MG tablet Commonly known  as: TYLENOL  Take 500-1,000 mg by mouth every 4 (four) hours as needed for moderate pain or mild pain.   albuterol  108 (90 Base) MCG/ACT inhaler Commonly known as: VENTOLIN  HFA Inhale 1-2 puffs into the lungs every 6 (six) hours as needed for wheezing or shortness of breath.   Alcohol  Wipes 70 % Misc Apply 1 each topically 2 (two) times daily.   amiodarone  100 MG tablet Commonly known as: PACERONE  TAKE 1 TABLET EVERY DAY   atorvastatin  40 MG tablet Commonly known as: LIPITOR TAKE 1 TABLET EVERY DAY   B-D ULTRAFINE III SHORT PEN 31G X 8 MM Misc Generic drug: Insulin  Pen Needle USE DAILY WITH VICTOZA  AND/OR NOVOLOG .   carvedilol  3.125 MG tablet Commonly known as: COREG  Take 1 tablet (3.125 mg total) by mouth 2 (two) times daily with a meal. What changed:  medication strength how much to take when  to take this   diclofenac  sodium 1 % Gel Commonly known as: VOLTAREN  Apply 2 g topically 4 (four) times daily as needed (pain).   DropSafe Alcohol  Prep 70 % Pads USE TWO TIMES DAILY AS DIRECTED   Eliquis  5 MG Tabs tablet Generic drug: apixaban  TAKE 1 TABLET TWICE DAILY   ferrous sulfate  324 MG Tbec Take 324 mg by mouth daily.   furosemide  20 MG tablet Commonly known as: LASIX  Take 4 tablets (80 mg total) by mouth twice a day.   HumaLOG  KwikPen 100 UNIT/ML KwikPen Generic drug: insulin  lispro USE AS DIRECTED BY YOUR PRESCRIBER. COMPLETE DIRECTIONS ARE INCLUDED IN A LETTER WITH YOUR ORIGINAL ORDER   hydrALAZINE  50 MG tablet Commonly known as: APRESOLINE  Take 1 tablet (50 mg total) by mouth 3 (three) times daily. What changed:  medication strength See the new instructions.   hydrocortisone cream 1 % Apply 1 application topically daily as needed for itching.   isosorbide  mononitrate 30 MG 24 hr tablet Commonly known as: IMDUR  Take 0.5 tablets (15 mg total) by mouth daily. What changed: how much to take   lamoTRIgine  200 MG tablet Commonly known as: LAMICTAL  Take 1 tablet (200 mg total) by mouth 2 (two) times daily.   levETIRAcetam  500 MG tablet Commonly known as: KEPPRA  Take 2 tablets every night   loratadine  10 MG tablet Commonly known as: Claritin  Take 1 tablet (10 mg total) by mouth daily.   multivitamin tablet Take 1 tablet by mouth daily.   Ozempic  (1 MG/DOSE) 4 MG/3ML Sopn Generic drug: Semaglutide  (1 MG/DOSE) INJECT 1MG  UNDER THE SKIN ONE TIME WEEKLY ON WEDNESDAYS   Pancrelipase  (Lip-Prot-Amyl) 25000-79000 units Cpep Take 2 capsules by mouth in the morning, at noon, and at bedtime.   potassium chloride  SA 20 MEQ tablet Commonly known as: KLOR-CON  M Take 2 tablets (40 mEq total) by mouth daily.   sennosides-docusate sodium  8.6-50 MG tablet Commonly known as: SENOKOT-S Take 1-2 tablets by mouth at bedtime as needed for constipation.   sertraline   50 MG tablet Commonly known as: ZOLOFT  TAKE 1 TABLET EVERY DAY   spironolactone  25 MG tablet Commonly known as: ALDACTONE  Take 1 tablet (25 mg total) by mouth daily.   tiZANidine  2 MG tablet Commonly known as: ZANAFLEX  TAKE 1 TABLET AT BEDTIME AS NEEDED   TOUJEO  SOLOSTAR Numidia Inject 20 Units into the skin at bedtime.   True Metrix Air Glucose Meter w/Device Kit USE AS DIRECTED   True Metrix Blood Glucose Test test strip Generic drug: glucose blood USE AS DIRECTED TO CHECK BLOOD SUGARS 2 TIMES PER DAY  TRUEplus Lancets 33G Misc TEST BLOOD SUGAR TWICE DAILY AS DIRECTED   vitamin C 1000 MG tablet Take 1,000 mg by mouth in the morning.   Zegalogue  0.6 MG/0.6ML Soaj Generic drug: Dasiglucagon  HCl Inject 0.6 mg into the skin daily as needed (use for extremely low blood sugars, less than 60).        Follow-up Information     Jarold Medici, MD Follow up in 1 week(s).   Specialty: Internal Medicine Contact information: [address] [place] [address] [phone]                Allergies[1]  Consultations: None   Procedures/Studies: ECHOCARDIOGRAM COMPLETE Result Date: 06/17/2024    ECHOCARDIOGRAM REPORT   Patient Name:   DESTANE SPEAS Date of Exam: 06/16/2024 Medical Rec #:  [id number]       Height:       61.0 in Accession #:    [id number]      Weight:       172.0 lb Date of Birth:  03-Nov-1945      BSA:          1.771 m Patient Age:    78 years        BP:           133/77 mmHg Patient Gender: F               HR:           72 bpm. Exam Location:  Inpatient Procedure: 2D Echo, Color Doppler and Cardiac Doppler (Both Spectral and Color            Flow Doppler were utilized during procedure). Indications:    Syncope and Chest Pain  History:        Patient has prior history of Echocardiogram examinations, most                 recent 01/02/2023. CHF; Risk Factors:Sleep Apnea and Diabetes.  Sonographer:    Nathanel Devonshire Referring Phys: JORIE SAUNDERS PATEL  IMPRESSIONS  1. Left ventricular ejection fraction, by estimation, is 60 to 65%. The left ventricle has normal function. The left ventricle has no regional wall motion abnormalities. There is moderate concentric left ventricular hypertrophy. Left ventricular diastolic parameters are consistent with Grade I diastolic dysfunction (impaired relaxation).  2. Right ventricular systolic function is normal. The right ventricular size is normal.  3. Left atrial size was severely dilated.  4. There is a well-seated ASD closure device in place. No residual shunting seen.  5. Right atrial size was moderately dilated.  6. The mitral valve is normal in structure. Trivial mitral valve regurgitation. No evidence of mitral stenosis.  7. The aortic valve is tricuspid. There is mild calcification of the aortic valve. Aortic valve regurgitation is not visualized. Aortic valve sclerosis/calcification is present, without any evidence of aortic stenosis.  8. The inferior vena cava is normal in size with greater than 50% respiratory variability, suggesting right atrial pressure of 3 mmHg. FINDINGS  Left Ventricle: Left ventricular ejection fraction, by estimation, is 60 to 65%. The left ventricle has normal function. The left ventricle has no regional wall motion abnormalities. The left ventricular internal cavity size was normal in size. There is  moderate concentric left ventricular hypertrophy. Left ventricular diastolic parameters are consistent with Grade I diastolic dysfunction (impaired relaxation). Right Ventricle: The right ventricular size is normal. No increase in right ventricular wall thickness. Right ventricular systolic function is normal. Left Atrium: Left  atrial size was severely dilated. Right Atrium: Right atrial size was moderately dilated. Pericardium: There is no evidence of pericardial effusion. Mitral Valve: The mitral valve is normal in structure. Trivial mitral valve regurgitation. No evidence of mitral valve  stenosis. Tricuspid Valve: The tricuspid valve is normal in structure. Tricuspid valve regurgitation is trivial. No evidence of tricuspid stenosis. Aortic Valve: The aortic valve is tricuspid. There is mild calcification of the aortic valve. Aortic valve regurgitation is not visualized. Aortic valve sclerosis/calcification is present, without any evidence of aortic stenosis. Aortic valve mean gradient measures 4.0 mmHg. Aortic valve peak gradient measures 8.1 mmHg. Aortic valve area, by VTI measures 1.79 cm. Pulmonic Valve: The pulmonic valve was normal in structure. Pulmonic valve regurgitation is not visualized. No evidence of pulmonic stenosis. Aorta: The aortic root is normal in size and structure. Venous: The inferior vena cava is normal in size with greater than 50% respiratory variability, suggesting right atrial pressure of 3 mmHg. IAS/Shunts: There is right bowing of the interatrial septum, suggestive of elevated left atrial pressure. No atrial level shunt detected by color flow Doppler.  LEFT VENTRICLE PLAX 2D LVIDd:         4.10 cm     Diastology LVIDs:         2.80 cm     LV e' medial:    4.46 cm/s LV PW:         0.90 cm     LV E/e' medial:  14.0 LV IVS:        1.20 cm     LV e' lateral:   8.92 cm/s LVOT diam:     1.80 cm     LV E/e' lateral: 7.0 LV SV:         48 LV SV Index:   27 LVOT Area:     2.54 cm LV IVRT:       109 msec  LV Volumes (MOD) LV vol d, MOD A2C: 74.4 ml LV vol d, MOD A4C: 67.4 ml LV vol s, MOD A2C: 30.7 ml LV vol s, MOD A4C: 22.4 ml LV SV MOD A2C:     43.7 ml LV SV MOD A4C:     67.4 ml LV SV MOD BP:      44.6 ml RIGHT VENTRICLE             IVC RV Basal diam:  3.10 cm     IVC diam: 1.50 cm RV S prime:     12.10 cm/s TAPSE (M-mode): 1.2 cm      PULMONARY VEINS                             Diastolic Velocity: 26.00 cm/s                             S/D Velocity:       1.70                             Systolic Velocity:  43.70 cm/s LEFT ATRIUM             Index LA diam:        3.50 cm 1.98  cm/m LA Vol (A2C):   44.8 ml 25.29 ml/m LA Vol (A4C):   43.5 ml 24.56 ml/m LA Biplane Vol: 45.0 ml 25.40 ml/m  AORTIC VALVE AV  Area (Vmax):    1.81 cm AV Area (Vmean):   1.56 cm AV Area (VTI):     1.79 cm AV Vmax:           142.00 cm/s AV Vmean:          94.200 cm/s AV VTI:            0.267 m AV Peak Grad:      8.1 mmHg AV Mean Grad:      4.0 mmHg LVOT Vmax:         101.00 cm/s LVOT Vmean:        57.800 cm/s LVOT VTI:          0.188 m LVOT/AV VTI ratio: 0.70  AORTA Ao Root diam: 2.70 cm Ao Asc diam:  3.30 cm MITRAL VALVE MV Area (PHT): 3.37 cm    SHUNTS MV Decel Time: 225 msec    Systemic VTI:  0.19 m MV E velocity: 62.40 cm/s  Systemic Diam: 1.80 cm MV A velocity: 70.20 cm/s MV E/A ratio:  0.89 Toribio Fuel MD Electronically signed by Toribio Fuel MD Signature Date/Time: 06/17/2024/12:43:45 AM    Final    EEG adult Result Date: 06/16/2024 Shelton Arlin KIDD, MD     06/16/2024  4:30 PM Patient Name: KLARE CRISS MRN: [id number] Epilepsy Attending: Arlin KIDD Shelton Referring Physician/Provider: Vernon Ranks, MD Date: 06/16/2024 Duration: 26.51 mins Patient history: 78yo F with syncope. EEG to evaluate for seizure Level of alertness: Awake, asleep AEDs during EEG study: LTG, LEV Technical aspects: This EEG study was done with scalp electrodes positioned according to the 10-20 International system of electrode placement. Electrical activity was reviewed with band pass filter of 1-70Hz , sensitivity of 7 uV/mm, display speed of 7mm/sec with a 60Hz  notched filter applied as appropriate. EEG data were recorded continuously and digitally stored.  Video monitoring was available and reviewed as appropriate. Description: The posterior dominant rhythm consists of 9 Hz activity of moderate voltage (25-35 uV) seen predominantly in posterior head regions, symmetric and reactive to eye opening and eye closing. Sleep was characterized by vertex waves, sleep spindles (12 to 14 Hz), maximal frontocentral region.   Hyperventilation and photic stimulation were not performed.   IMPRESSION: This study is within normal limits. No seizures or epileptiform discharges were seen throughout the recording. A normal interictal EEG does not exclude the diagnosis of epilepsy. Arlin KIDD Shelton   X-ray chest PA and lateral Result Date: 06/15/2024 CLINICAL DATA:  Chest pain.  Syncope. EXAM: CHEST - 2 VIEW COMPARISON:  Chest CTA earlier today FINDINGS: Mild cardiomegaly. Suspected CardioMEMS at the left lung base. No focal airspace disease, large pleural effusion, pneumothorax or pulmonary edema. No acute osseous findings. IMPRESSION: Mild cardiomegaly. Electronically Signed   By: Andrea Gasman M.D.   On: 06/15/2024 23:00   MR BRAIN WO CONTRAST Result Date: 06/15/2024 EXAM: MRI BRAIN WITHOUT CONTRAST 06/15/2024 06:46:21 PM TECHNIQUE: Multiplanar multisequence MRI of the head/brain was performed without the administration of intravenous contrast. COMPARISON: MRI brain 07/06/2022. CLINICAL HISTORY: Neuro deficit, acute, stroke suspected. FINDINGS: BRAIN AND VENTRICLES: Old infarct along the right central sulcus. Stable background of mild chronic small vessel disease. No acute infarct. No intracranial hemorrhage. No mass. No midline shift. No hydrocephalus. The sella is unremarkable. Normal flow voids. ORBITS: No acute abnormality. SINUSES AND MASTOIDS: No acute abnormality. BONES AND SOFT TISSUES: Normal marrow signal. No acute soft tissue abnormality. IMPRESSION: 1. No acute intracranial abnormality. 2. Old infarct along the right central  sulcus. Electronically signed by: Ryan Chess MD 06/15/2024 06:59 PM EST RP Workstation: [id number]   CT Angio Chest PE W and/or Wo Contrast Result Date: 06/15/2024 CLINICAL DATA:  Pulmonary embolism suspected, high probability. Syncopal episode. EXAM: CT ANGIOGRAPHY CHEST WITH CONTRAST TECHNIQUE: Multidetector CT imaging of the chest was performed using the standard protocol during bolus  administration of intravenous contrast. Multiplanar CT image reconstructions and MIPs were obtained to evaluate the vascular anatomy. RADIATION DOSE REDUCTION: This exam was performed according to the departmental dose-optimization program which includes automated exposure control, adjustment of the mA and/or kV according to patient size and/or use of iterative reconstruction technique. CONTRAST:  75mL OMNIPAQUE  IOHEXOL  350 MG/ML SOLN COMPARISON:  11/17/2020. FINDINGS: Cardiovascular: The heart is enlarged and there is no pericardial effusion. Scattered coronary artery calcifications are noted. An occlusion device is noted between the right atrium and left ventricle. There is atherosclerotic calcification of the aorta without evidence of aneurysm. The pulmonary trunk is mildly distended which may be associated with underlying pulmonary artery hypertension. No pulmonary embolism is seen. Mediastinum/Nodes: No mediastinal, hilar, or axillary lymphadenopathy is seen. The thyroid  gland, trachea, and esophagus are within normal limits. Lungs/Pleura: Paraseptal and centrilobular emphysematous changes are present. Atelectasis is noted bilaterally. No consolidation, effusion, or pneumothorax is seen. A stable thickening is noted along the fissure on the left. No discrete nodule is identified. Upper Abdomen: The gallbladder is surgically absent. No acute abnormality. Musculoskeletal: Degenerative changes are noted in the thoracic spine. No acute osseous abnormality. Review of the MIP images confirms the above findings. IMPRESSION: 1. No evidence of pulmonary embolism or other acute process. 2. Emphysema. 3. Distended pulmonary trunk suggesting underlying pulmonary artery hypertension. 4. Coronary artery calcification. 5. Aortic atherosclerosis. Electronically Signed   By: Leita Birmingham M.D.   On: 06/15/2024 16:46   DG HIP UNILAT WITH PELVIS 2-3 VIEWS LEFT Result Date: 06/15/2024 CLINICAL DATA:  Left hip pain. EXAM: DG  HIP (WITH OR WITHOUT PELVIS) 2-3V LEFT COMPARISON:  None Available. FINDINGS: There is no evidence of an acute hip fracture or dislocation. Mild degenerative changes are seen in the form of joint space narrowing and acetabular sclerosis. Postoperative changes are present within the lower lumbar spine and sacrum. IMPRESSION: Mild degenerative changes in the left hip. Electronically Signed   By: Suzen Dials M.D.   On: 06/15/2024 16:29   CT Cervical Spine Wo Contrast Result Date: 06/15/2024 CLINICAL DATA:  Fall EXAM: CT CERVICAL SPINE WITHOUT CONTRAST TECHNIQUE: Multidetector CT imaging of the cervical spine was performed without intravenous contrast. Multiplanar CT image reconstructions were also generated. RADIATION DOSE REDUCTION: This exam was performed according to the departmental dose-optimization program which includes automated exposure control, adjustment of the mA and/or kV according to patient size and/or use of iterative reconstruction technique. COMPARISON:  None Available. FINDINGS: No significant alignment abnormality. The craniocervical junction is normal. The atlantoaxial articulation is normal. The dens is normal. There is no fracture identified. Other comments: There is severe erosive facet arthropathy on the right at C2-3. Multilevel degenerative disc disease IMPRESSION: Degenerative changes.  No fracture Electronically Signed   By: Nancyann Burns M.D.   On: 06/15/2024 16:18   CT HEAD CODE STROKE WO CONTRAST Result Date: 06/15/2024 CLINICAL DATA:  Code stroke.  Neuro deficit, acute stroke suspected EXAM: CT HEAD WITHOUT CONTRAST TECHNIQUE: Contiguous axial images were obtained from the base of the skull through the vertex without intravenous contrast. RADIATION DOSE REDUCTION: This exam was performed according to the departmental dose-optimization  program which includes automated exposure control, adjustment of the mA and/or kV according to patient size and/or use of iterative  reconstruction technique. COMPARISON:  MRI July 06, 2022 FINDINGS: There is an old small right frontal cortical infarct There is no hemorrhage. No acute ischemic changes. No dense vessel. The ventricles are normal. IMPRESSION: 1. Old right frontal cortical infarct 2. No acute ischemic changes or dense vessel Electronically Signed   By: Nancyann Burns M.D.   On: 06/15/2024 14:28   DG Foot Complete Left Result Date: 05/18/2024 Please see detailed radiograph report in office note.  DG Foot Complete Right Result Date: 05/18/2024 Please see detailed radiograph report in office note.  DG Ankle Complete Left Result Date: 05/18/2024 Please see detailed radiograph report in office note.  DG Ankle Complete Right Result Date: 05/18/2024 Please see detailed radiograph report in office note.    Discharge Exam: Vitals:   06/17/24 0435 06/17/24 0813  BP: (!) 121/57 118/62  Pulse: (!) 57 60  Resp: 14 15  Temp: 98.3 F (36.8 C) 98.2 F (36.8 C)  SpO2: 96%    Vitals:   06/16/24 2010 06/17/24 0029 06/17/24 0435 06/17/24 0813  BP: 106/63 (!) 108/58 (!) 121/57 118/62  Pulse: (!) 58 (!) 55 (!) 57 60  Resp: 20 16 14 15   Temp: 98.4 F (36.9 C) 98.5 F (36.9 C) 98.3 F (36.8 C) 98.2 F (36.8 C)  TempSrc: Oral Oral Oral Oral  SpO2: 99% 99% 96%   Weight:   76.9 kg   Height:        General: Pt is alert, awake, not in acute distress Cardiovascular: RRR, S1/S2 +, no rubs, no gallops Respiratory: CTA bilaterally, no wheezing, no rhonchi Abdominal: Soft, NT, ND, bowel sounds + Extremities: no edema, no cyanosis    The results of significant diagnostics from this hospitalization (including imaging, microbiology, ancillary and laboratory) are listed below for reference.     Microbiology: No results found for this or any previous visit (from the past 240 hours).   Labs: BNP (last 3 results) Recent Labs    07/22/23 1141 02/07/24 1214  BNP 30.3 67.7   Basic Metabolic Panel: Recent  Labs  Lab 06/15/24 1414 06/16/24 0458 06/17/24 0812  NA 138  139 139 135  K 3.6  3.6 3.6 3.9  CL 98  97* 103 98  CO2 29 28 30   GLUCOSE 108*  113* 241* 134*  BUN 16  17 16 16   CREATININE 1.05*  1.20* 1.18* 1.05*  CALCIUM  9.2 8.4* 8.8*   Liver Function Tests: Recent Labs  Lab 06/15/24 1414  AST 33  ALT 25  ALKPHOS 139*  BILITOT 0.3  PROT 8.2*  ALBUMIN  4.2   No results for input(s): LIPASE, AMYLASE in the last 168 hours. No results for input(s): AMMONIA in the last 168 hours. CBC: Recent Labs  Lab 06/15/24 1414 06/16/24 0458  WBC 3.6* 2.9*  NEUTROABS 1.7  --   HGB 8.9*  10.2* 8.6*  HCT 29.6*  30.0* 29.2*  MCV 74.2* 75.3*  PLT 315 318   Cardiac Enzymes: No results for input(s): CKTOTAL, CKMB, CKMBINDEX, TROPONINI in the last 168 hours. BNP: Invalid input(s): POCBNP CBG: Recent Labs  Lab 06/15/24 1409 06/16/24 0750 06/17/24 0433  GLUCAP 106* 112* 144*   D-Dimer No results for input(s): DDIMER in the last 72 hours. Hgb A1c No results for input(s): HGBA1C in the last 72 hours. Lipid Profile No results for input(s): CHOL, HDL, LDLCALC, TRIG, CHOLHDL, LDLDIRECT  in the last 72 hours. Thyroid  function studies No results for input(s): TSH, T4TOTAL, T3FREE, THYROIDAB in the last 72 hours.  Invalid input(s): FREET3 Anemia work up No results for input(s): VITAMINB12, FOLATE, FERRITIN, TIBC, IRON , RETICCTPCT in the last 72 hours. Urinalysis    Component Value Date/Time   COLORURINE STRAW (A) 06/15/2024 1457   APPEARANCEUR CLEAR 06/15/2024 1457   LABSPEC 1.004 (L) 06/15/2024 1457   PHURINE 7.0 06/15/2024 1457   GLUCOSEU NEGATIVE 06/15/2024 1457   GLUCOSEU NEGATIVE 04/17/2016 0955   HGBUR NEGATIVE 06/15/2024 1457   BILIRUBINUR NEGATIVE 06/15/2024 1457   BILIRUBINUR Negative 08/29/2023 1649   KETONESUR NEGATIVE 06/15/2024 1457   PROTEINUR NEGATIVE 06/15/2024 1457   UROBILINOGEN 0.2 08/29/2023 1649    UROBILINOGEN 0.2 04/17/2016 0955   NITRITE NEGATIVE 06/15/2024 1457   LEUKOCYTESUR NEGATIVE 06/15/2024 1457   Sepsis Labs Recent Labs  Lab 06/15/24 1414 06/16/24 0458  WBC 3.6* 2.9*   Microbiology No results found for this or any previous visit (from the past 240 hours).  FURTHER DISCHARGE INSTRUCTIONS:   Get Medicines reviewed and adjusted: Please take all your medications with you for your next visit with your Primary MD   Laboratory/radiological data: Please request your Primary MD to go over all hospital tests and procedure/radiological results at the follow up, please ask your Primary MD to get all Hospital records sent to his/her office.   In some cases, they will be blood work, cultures and biopsy results pending at the time of your discharge. Please request that your primary care M.D. goes through all the records of your hospital data and follows up on these results.   Also Note the following: If you experience worsening of your admission symptoms, develop shortness of breath, life threatening emergency, suicidal or homicidal thoughts you must seek medical attention immediately by calling 911 or calling your MD immediately  if symptoms less severe.   You must read complete instructions/literature along with all the possible adverse reactions/side effects for all the Medicines you take and that have been prescribed to you. Take any new Medicines after you have completely understood and accpet all the possible adverse reactions/side effects.    patient was instructed, not to drive, operate heavy machinery, perform activities at heights, swimming or participation in water  activities or provide baby-sitting services while on Pain, Sleep and Anxiety Medications; until their outpatient Physician has advised to do so again. Also recommended to not to take more than prescribed Pain, Sleep and Anxiety Medications.  It is not advisable to combine anxiety, sleep and pain medications  without talking with your primary care provider.     Wear Seat belts while driving.   Please note: You were cared for by a hospitalist during your hospital stay. Once you are discharged, your primary care physician will handle any further medical issues. Please note that NO REFILLS for any discharge medications will be authorized once you are discharged, as it is imperative that you return to your primary care physician (or establish a relationship with a primary care physician if you do not have one) for your post hospital discharge needs so that they can reassess your need for medications and monitor your lab values  Time coordinating discharge: Over 30 minutes  SIGNED:   Fredia Skeeter, MD  [facility] 06/17/2024, 10:03 AM *Please note that this is a verbal dictation therefore any spelling or grammatical errors are due to the Dragon Medical One system interpretation. If 7PM-7AM, please contact night-coverage [email or web address]     [  1]  Allergies Allergen Reactions   Other Anaphylaxis and Rash    Bleach Cigarette smoke/ Cough   Penicillins Hives    Did it involve swelling of the face/tongue/throat, SOB, or low BP? No Did it involve sudden or severe rash/hives, skin peeling, or any reaction on the inside of your mouth or nose? Yes Did you need to seek medical attention at a hospital or doctor's office? Yes When did it last happen?      Childhood allergy  If all above answers are NO, may proceed with cephalosporin use.     Sulfa Antibiotics Hives   Aspirin  Other (See Comments)     On aspirin  81 mg - Rectal bleeding in dec 2018   Codeine     Headache and makes the patient feel off

## 2024-06-17 NOTE — Plan of Care (Signed)
 " Problem: Education: Goal: Knowledge of condition and prescribed therapy will improve 06/17/2024 0917 by Emil Roselie SAUNDERS, RN Outcome: Adequate for Discharge 06/17/2024 [phone] by Emil Roselie SAUNDERS, RN Outcome: Adequate for Discharge 06/17/2024 [phone] by Emil Roselie SAUNDERS, RN Outcome: Progressing   Problem: Cardiac: Goal: Will achieve and/or maintain adequate cardiac output 06/17/2024 0917 by Emil Roselie SAUNDERS, RN Outcome: Adequate for Discharge 06/17/2024 [phone] by Emil Roselie SAUNDERS, RN Outcome: Adequate for Discharge 06/17/2024 [phone] by Emil Roselie SAUNDERS, RN Outcome: Progressing   Problem: Physical Regulation: Goal: Complications related to the disease process, condition or treatment will be avoided or minimized 06/17/2024 0917 by Emil Roselie SAUNDERS, RN Outcome: Adequate for Discharge 06/17/2024 [phone] by Emil Roselie SAUNDERS, RN Outcome: Adequate for Discharge 06/17/2024 [phone] by Emil Roselie SAUNDERS, RN Outcome: Progressing   Problem: Education: Goal: Knowledge of General Education information will improve Description: Including pain rating scale, medication(s)/side effects and non-pharmacologic comfort measures 06/17/2024 0917 by Emil Roselie SAUNDERS, RN Outcome: Adequate for Discharge 06/17/2024 [phone] by Emil Roselie SAUNDERS, RN Outcome: Adequate for Discharge 06/17/2024 [phone] by Emil Roselie SAUNDERS, RN Outcome: Progressing   Problem: Health Behavior/Discharge Planning: Goal: Ability to manage health-related needs will improve 06/17/2024 0917 by Emil Roselie SAUNDERS, RN Outcome: Adequate for Discharge 06/17/2024 [phone] by Emil Roselie SAUNDERS, RN Outcome: Adequate for Discharge 06/17/2024 [phone] by Emil Roselie SAUNDERS, RN Outcome: Progressing   Problem: Clinical Measurements: Goal: Ability to maintain clinical measurements within normal limits will improve 06/17/2024 0917 by Emil Roselie SAUNDERS, RN Outcome: Adequate for Discharge 06/17/2024 [phone] by Emil Roselie SAUNDERS, RN Outcome:  Adequate for Discharge 06/17/2024 [phone] by Emil Roselie SAUNDERS, RN Outcome: Progressing Goal: Will remain free from infection 06/17/2024 0917 by Emil Roselie SAUNDERS, RN Outcome: Adequate for Discharge 06/17/2024 [phone] by Emil Roselie SAUNDERS, RN Outcome: Adequate for Discharge 06/17/2024 [phone] by Emil Roselie SAUNDERS, RN Outcome: Progressing Goal: Diagnostic test results will improve 06/17/2024 0917 by Emil Roselie SAUNDERS, RN Outcome: Adequate for Discharge 06/17/2024 [phone] by Emil Roselie SAUNDERS, RN Outcome: Adequate for Discharge 06/17/2024 [phone] by Emil Roselie SAUNDERS, RN Outcome: Progressing Goal: Respiratory complications will improve 06/17/2024 0917 by Emil Roselie SAUNDERS, RN Outcome: Adequate for Discharge 06/17/2024 [phone] by Emil Roselie SAUNDERS, RN Outcome: Adequate for Discharge 06/17/2024 [phone] by Emil Roselie SAUNDERS, RN Outcome: Progressing Goal: Cardiovascular complication will be avoided 06/17/2024 9082 by Emil Roselie SAUNDERS, RN Outcome: Adequate for Discharge 06/17/2024 [phone] by Emil Roselie SAUNDERS, RN Outcome: Adequate for Discharge 06/17/2024 [phone] by Emil Roselie SAUNDERS, RN Outcome: Progressing   Problem: Activity: Goal: Risk for activity intolerance will decrease 06/17/2024 0917 by Emil Roselie SAUNDERS, RN Outcome: Adequate for Discharge 06/17/2024 [phone] by Emil Roselie SAUNDERS, RN Outcome: Adequate for Discharge 06/17/2024 [phone] by Emil Roselie SAUNDERS, RN Outcome: Progressing   Problem: Nutrition: Goal: Adequate nutrition will be maintained 06/17/2024 0917 by Emil Roselie SAUNDERS, RN Outcome: Adequate for Discharge 06/17/2024 [phone] by Emil Roselie SAUNDERS, RN Outcome: Adequate for Discharge 06/17/2024 [phone] by Emil Roselie SAUNDERS, RN Outcome: Progressing   Problem: Coping: Goal: Level of anxiety will decrease 06/17/2024 0917 by Emil Roselie SAUNDERS, RN Outcome: Adequate for Discharge 06/17/2024 [phone] by Emil Roselie SAUNDERS, RN Outcome: Adequate for Discharge 06/17/2024 [phone] by  Emil Roselie SAUNDERS, RN Outcome: Progressing   Problem: Elimination: Goal: Will not experience complications related to bowel motility 06/17/2024 0917 by Emil Roselie SAUNDERS, RN Outcome: Adequate for Discharge 06/17/2024 0917 by Emil Roselie SAUNDERS, RN Outcome: Adequate for Discharge 06/17/2024 [phone] by Emil Roselie SAUNDERS, RN Outcome: Progressing  Goal: Will not experience complications related to urinary retention 06/17/2024 0917 by Emil Roselie SAUNDERS, RN Outcome: Adequate for Discharge 06/17/2024 [phone] by Emil Roselie SAUNDERS, RN Outcome: Adequate for Discharge 06/17/2024 [phone] by Emil Roselie SAUNDERS, RN Outcome: Progressing   Problem: Pain Managment: Goal: General experience of comfort will improve and/or be controlled 06/17/2024 0917 by Emil Roselie SAUNDERS, RN Outcome: Adequate for Discharge 06/17/2024 [phone] by Emil Roselie SAUNDERS, RN Outcome: Adequate for Discharge 06/17/2024 [phone] by Emil Roselie SAUNDERS, RN Outcome: Progressing   Problem: Safety: Goal: Ability to remain free from injury will improve 06/17/2024 0917 by Emil Roselie SAUNDERS, RN Outcome: Adequate for Discharge 06/17/2024 [phone] by Emil Roselie SAUNDERS, RN Outcome: Adequate for Discharge 06/17/2024 [phone] by Emil Roselie SAUNDERS, RN Outcome: Progressing   Problem: Skin Integrity: Goal: Risk for impaired skin integrity will decrease 06/17/2024 0917 by Emil Roselie SAUNDERS, RN Outcome: Adequate for Discharge 06/17/2024 [phone] by Emil Roselie SAUNDERS, RN Outcome: Adequate for Discharge 06/17/2024 [phone] by Emil Roselie SAUNDERS, RN Outcome: Progressing   "

## 2024-06-17 NOTE — Discharge Instructions (Signed)
 [facility]ABRA

## 2024-06-22 ENCOUNTER — Encounter (HOSPITAL_COMMUNITY): Admission: RE | Admit: 2024-06-22 | Source: Ambulatory Visit

## 2024-06-23 ENCOUNTER — Ambulatory Visit (HOSPITAL_COMMUNITY)
Admission: RE | Admit: 2024-06-23 | Discharge: 2024-06-23 | Disposition: A | Discharge status: Home / Self Care | Source: Ambulatory Visit | Attending: Cardiology | Admitting: Cardiology

## 2024-06-23 DIAGNOSIS — I5032 Chronic diastolic (congestive) heart failure: Secondary | ICD-10-CM | POA: Insufficient documentation

## 2024-06-23 LAB — BASIC METABOLIC PANEL WITH GFR
Anion gap: 8 (ref 5–15)
BUN: 20 mg/dL (ref 8–23)
CO2: 30 mmol/L (ref 22–32)
Calcium: 9.3 mg/dL (ref 8.9–10.3)
Chloride: 99 mmol/L (ref 98–111)
Creatinine, Ser: 1.15 mg/dL — ABNORMAL HIGH (ref 0.44–1.00)
GFR, Estimated: 49 mL/min — ABNORMAL LOW
Glucose, Bld: 189 mg/dL — ABNORMAL HIGH (ref 70–99)
Potassium: 4.5 mmol/L (ref 3.5–5.1)
Sodium: 136 mmol/L (ref 135–145)

## 2024-06-24 ENCOUNTER — Inpatient Hospital Stay: Payer: Self-pay | Admitting: Nurse Practitioner

## 2024-06-26 ENCOUNTER — Telehealth (HOSPITAL_COMMUNITY): Payer: Self-pay | Admitting: Licensed Clinical Social Worker

## 2024-06-26 NOTE — Telephone Encounter (Signed)
 H&V Care Navigation CSW Progress Note  Clinical Social Worker received call from pt requesting help with transportation.  No upcoming visits with clinic just wanting help with transportation in general for medical visits.   CSW advised patient to call insurance to inquire about transportation benefit.  She is already established with [facility] [employer] but does not like their mode of transportation.  Referred her to [employer] to apply for medical visit transportation.  CSW will pursue above options and call CSW if she needs further assistance.  Andriette HILARIO Leech, LCSW Clinical Social Worker [facility] Desk#: [phone] Cell#: [phone]

## 2024-06-29 ENCOUNTER — Encounter (HOSPITAL_COMMUNITY): Admission: RE | Admit: 2024-06-29 | Source: Ambulatory Visit

## 2024-06-30 ENCOUNTER — Other Ambulatory Visit: Payer: Self-pay | Admitting: Internal Medicine

## 2024-07-01 ENCOUNTER — Other Ambulatory Visit: Payer: Self-pay | Admitting: Lab

## 2024-07-01 ENCOUNTER — Encounter: Payer: Self-pay | Admitting: Lab

## 2024-07-01 DIAGNOSIS — M19079 Primary osteoarthritis, unspecified ankle and foot: Secondary | ICD-10-CM

## 2024-07-03 ENCOUNTER — Other Ambulatory Visit (HOSPITAL_COMMUNITY): Payer: Self-pay | Admitting: Cardiology

## 2024-07-03 ENCOUNTER — Other Ambulatory Visit: Payer: Self-pay | Admitting: Cardiology

## 2024-07-03 ENCOUNTER — Other Ambulatory Visit: Payer: Self-pay | Admitting: Internal Medicine

## 2024-07-06 ENCOUNTER — Encounter (HOSPITAL_COMMUNITY)
Admission: RE | Admit: 2024-07-06 | Discharge: 2024-07-06 | Disposition: A | Discharge status: Home / Self Care | Source: Ambulatory Visit | Attending: Internal Medicine | Admitting: Internal Medicine

## 2024-07-06 VITALS — BP 112/54 | HR 75 | Temp 98.2°F | Resp 18

## 2024-07-06 DIAGNOSIS — D500 Iron deficiency anemia secondary to blood loss (chronic): Secondary | ICD-10-CM | POA: Insufficient documentation

## 2024-07-06 MED ORDER — DIPHENHYDRAMINE HCL 25 MG PO CAPS
ORAL_CAPSULE | ORAL | Status: AC
  Filled 2024-07-06: qty 1

## 2024-07-06 MED ORDER — ACETAMINOPHEN 325 MG PO TABS
ORAL_TABLET | ORAL | Status: AC
  Filled 2024-07-06: qty 2

## 2024-07-06 MED ORDER — ACETAMINOPHEN 325 MG PO TABS
650.0000 mg | ORAL_TABLET | ORAL | Status: DC
  Administered 2024-07-06: 650 mg via ORAL

## 2024-07-06 MED ORDER — SODIUM CHLORIDE 0.9 % IV SOLN
250.0000 mg | Freq: Once | INTRAVENOUS | Status: AC
  Administered 2024-07-06: 250 mg via INTRAVENOUS
  Filled 2024-07-06: qty 250

## 2024-07-06 MED ORDER — DIPHENHYDRAMINE HCL 25 MG PO CAPS
25.0000 mg | ORAL_CAPSULE | Freq: Once | ORAL | Status: AC
  Administered 2024-07-06: 25 mg via ORAL

## 2024-07-10 ENCOUNTER — Ambulatory Visit: Admitting: Neurology

## 2024-07-13 ENCOUNTER — Encounter (HOSPITAL_COMMUNITY)
Admission: RE | Admit: 2024-07-13 | Discharge: 2024-07-13 | Disposition: A | Source: Ambulatory Visit | Attending: Internal Medicine

## 2024-07-13 VITALS — BP 134/76 | HR 57 | Temp 98.2°F | Resp 16 | Wt 170.0 lb

## 2024-07-13 DIAGNOSIS — D500 Iron deficiency anemia secondary to blood loss (chronic): Secondary | ICD-10-CM

## 2024-07-13 MED ORDER — SODIUM CHLORIDE 0.9 % IV SOLN
250.0000 mg | Freq: Once | INTRAVENOUS | Status: AC
  Administered 2024-07-13: 250 mg via INTRAVENOUS
  Filled 2024-07-13: qty 250

## 2024-07-13 MED ORDER — ACETAMINOPHEN 325 MG PO TABS
ORAL_TABLET | ORAL | Status: AC
  Filled 2024-07-13: qty 2

## 2024-07-13 MED ORDER — ACETAMINOPHEN 325 MG PO TABS
650.0000 mg | ORAL_TABLET | ORAL | Status: DC
  Administered 2024-07-13: 650 mg via ORAL

## 2024-07-13 MED ORDER — DIPHENHYDRAMINE HCL 25 MG PO CAPS
25.0000 mg | ORAL_CAPSULE | Freq: Once | ORAL | Status: AC
  Administered 2024-07-13: 25 mg via ORAL

## 2024-07-13 MED ORDER — DIPHENHYDRAMINE HCL 25 MG PO CAPS
ORAL_CAPSULE | ORAL | Status: AC
  Filled 2024-07-13: qty 1

## 2024-07-16 ENCOUNTER — Other Ambulatory Visit: Payer: Self-pay

## 2024-07-16 MED ORDER — ATORVASTATIN CALCIUM 40 MG PO TABS: 40.0000 mg | ORAL_TABLET | Freq: Every day | ORAL | 3 refills | Status: AC

## 2024-07-20 ENCOUNTER — Encounter (HOSPITAL_COMMUNITY)

## 2024-07-27 ENCOUNTER — Encounter (HOSPITAL_COMMUNITY): Admission: RE | Admit: 2024-07-27 | Source: Ambulatory Visit

## 2024-08-03 ENCOUNTER — Encounter (HOSPITAL_COMMUNITY)

## 2024-08-10 ENCOUNTER — Encounter (HOSPITAL_COMMUNITY)

## 2024-08-19 ENCOUNTER — Ambulatory Visit: Admitting: Podiatry

## 2024-09-02 ENCOUNTER — Encounter: Payer: Self-pay | Admitting: Internal Medicine

## 2024-09-03 ENCOUNTER — Inpatient Hospital Stay: Admitting: Internal Medicine

## 2024-09-03 ENCOUNTER — Inpatient Hospital Stay: Attending: Physician Assistant

## 2024-09-10 ENCOUNTER — Encounter: Payer: Self-pay | Admitting: Internal Medicine

## 2025-01-08 ENCOUNTER — Ambulatory Visit: Admitting: Neurology

## 2025-01-13 ENCOUNTER — Ambulatory Visit

## 2025-01-13 ENCOUNTER — Ambulatory Visit: Payer: Self-pay
